# Patient Record
Sex: Male | Born: 1960 | Race: White | Hispanic: No | Marital: Single | Smoking: Former smoker
Health system: Southern US, Community
[De-identification: ages and names within clinical notes are randomized; demographics above are authoritative.]

## PROBLEM LIST (undated history)

## (undated) DIAGNOSIS — D649 Anemia, unspecified: Secondary | ICD-10-CM

## (undated) DIAGNOSIS — E119 Type 2 diabetes mellitus without complications: Secondary | ICD-10-CM

## (undated) DIAGNOSIS — M1990 Unspecified osteoarthritis, unspecified site: Secondary | ICD-10-CM

## (undated) DIAGNOSIS — L0390 Cellulitis, unspecified: Secondary | ICD-10-CM

## (undated) DIAGNOSIS — F32A Depression, unspecified: Secondary | ICD-10-CM

## (undated) DIAGNOSIS — I255 Ischemic cardiomyopathy: Secondary | ICD-10-CM

## (undated) DIAGNOSIS — E039 Hypothyroidism, unspecified: Secondary | ICD-10-CM

## (undated) DIAGNOSIS — I219 Acute myocardial infarction, unspecified: Secondary | ICD-10-CM

## (undated) DIAGNOSIS — K219 Gastro-esophageal reflux disease without esophagitis: Secondary | ICD-10-CM

## (undated) DIAGNOSIS — I2510 Atherosclerotic heart disease of native coronary artery without angina pectoris: Secondary | ICD-10-CM

## (undated) DIAGNOSIS — C801 Malignant (primary) neoplasm, unspecified: Secondary | ICD-10-CM

## (undated) DIAGNOSIS — J449 Chronic obstructive pulmonary disease, unspecified: Secondary | ICD-10-CM

## (undated) DIAGNOSIS — I10 Essential (primary) hypertension: Secondary | ICD-10-CM

## (undated) DIAGNOSIS — I739 Peripheral vascular disease, unspecified: Secondary | ICD-10-CM

## (undated) DIAGNOSIS — G4730 Sleep apnea, unspecified: Secondary | ICD-10-CM

## (undated) DIAGNOSIS — F329 Major depressive disorder, single episode, unspecified: Secondary | ICD-10-CM

## (undated) DIAGNOSIS — Z87442 Personal history of urinary calculi: Secondary | ICD-10-CM

## (undated) HISTORY — PX: DENTAL SURGERY: SHX609

## (undated) HISTORY — DX: Ischemic cardiomyopathy: I25.5

## (undated) HISTORY — DX: Atherosclerotic heart disease of native coronary artery without angina pectoris: I25.10

---

## 1998-12-07 ENCOUNTER — Emergency Department (HOSPITAL_COMMUNITY): Admission: EM | Admit: 1998-12-07 | Discharge: 1998-12-07 | Payer: Self-pay | Admitting: Emergency Medicine

## 1998-12-07 ENCOUNTER — Encounter: Payer: Self-pay | Admitting: Emergency Medicine

## 2001-07-13 ENCOUNTER — Ambulatory Visit (HOSPITAL_COMMUNITY): Admission: RE | Admit: 2001-07-13 | Discharge: 2001-07-13 | Payer: Self-pay | Admitting: Family Medicine

## 2001-07-13 ENCOUNTER — Encounter: Payer: Self-pay | Admitting: Family Medicine

## 2002-01-18 ENCOUNTER — Encounter: Payer: Self-pay | Admitting: Family Medicine

## 2002-01-18 ENCOUNTER — Ambulatory Visit (HOSPITAL_COMMUNITY): Admission: RE | Admit: 2002-01-18 | Discharge: 2002-01-18 | Payer: Self-pay | Admitting: Family Medicine

## 2008-03-19 ENCOUNTER — Emergency Department: Payer: Self-pay | Admitting: Emergency Medicine

## 2017-12-11 ENCOUNTER — Emergency Department (HOSPITAL_COMMUNITY): Payer: BLUE CROSS/BLUE SHIELD

## 2017-12-11 ENCOUNTER — Emergency Department (HOSPITAL_COMMUNITY)
Admission: EM | Admit: 2017-12-11 | Discharge: 2017-12-11 | Disposition: A | Discharge status: Home / Self Care | Payer: BLUE CROSS/BLUE SHIELD | Attending: Emergency Medicine | Admitting: Emergency Medicine

## 2017-12-11 ENCOUNTER — Encounter (HOSPITAL_COMMUNITY): Payer: Self-pay | Admitting: *Deleted

## 2017-12-11 ENCOUNTER — Other Ambulatory Visit: Payer: Self-pay

## 2017-12-11 DIAGNOSIS — W19XXXA Unspecified fall, initial encounter: Secondary | ICD-10-CM

## 2017-12-11 DIAGNOSIS — Z7982 Long term (current) use of aspirin: Secondary | ICD-10-CM | POA: Insufficient documentation

## 2017-12-11 DIAGNOSIS — R42 Dizziness and giddiness: Secondary | ICD-10-CM | POA: Diagnosis present

## 2017-12-11 DIAGNOSIS — Z79899 Other long term (current) drug therapy: Secondary | ICD-10-CM | POA: Insufficient documentation

## 2017-12-11 DIAGNOSIS — R531 Weakness: Secondary | ICD-10-CM

## 2017-12-11 DIAGNOSIS — E1165 Type 2 diabetes mellitus with hyperglycemia: Secondary | ICD-10-CM | POA: Insufficient documentation

## 2017-12-11 DIAGNOSIS — I10 Essential (primary) hypertension: Secondary | ICD-10-CM | POA: Insufficient documentation

## 2017-12-11 DIAGNOSIS — Z87891 Personal history of nicotine dependence: Secondary | ICD-10-CM | POA: Diagnosis not present

## 2017-12-11 DIAGNOSIS — Z9181 History of falling: Secondary | ICD-10-CM | POA: Insufficient documentation

## 2017-12-11 DIAGNOSIS — Z794 Long term (current) use of insulin: Secondary | ICD-10-CM | POA: Diagnosis not present

## 2017-12-11 DIAGNOSIS — R739 Hyperglycemia, unspecified: Secondary | ICD-10-CM

## 2017-12-11 HISTORY — DX: Type 2 diabetes mellitus without complications: E11.9

## 2017-12-11 HISTORY — DX: Depression, unspecified: F32.A

## 2017-12-11 HISTORY — DX: Major depressive disorder, single episode, unspecified: F32.9

## 2017-12-11 HISTORY — DX: Essential (primary) hypertension: I10

## 2017-12-11 LAB — CBC WITH DIFFERENTIAL/PLATELET
Basophils Absolute: 0 10*3/uL (ref 0.0–0.1)
Basophils Relative: 0 %
Eosinophils Absolute: 0.4 10*3/uL (ref 0.0–0.7)
Eosinophils Relative: 4 %
HCT: 39.7 % (ref 39.0–52.0)
Hemoglobin: 13.7 g/dL (ref 13.0–17.0)
Lymphocytes Relative: 21 %
Lymphs Abs: 2.1 10*3/uL (ref 0.7–4.0)
MCH: 29.8 pg (ref 26.0–34.0)
MCHC: 34.5 g/dL (ref 30.0–36.0)
MCV: 86.5 fL (ref 78.0–100.0)
Monocytes Absolute: 0.9 10*3/uL (ref 0.1–1.0)
Monocytes Relative: 9 %
Neutro Abs: 6.7 10*3/uL (ref 1.7–7.7)
Neutrophils Relative %: 66 %
Platelets: 314 10*3/uL (ref 150–400)
RBC: 4.59 MIL/uL (ref 4.22–5.81)
RDW: 12.2 % (ref 11.5–15.5)
WBC: 10.1 10*3/uL (ref 4.0–10.5)

## 2017-12-11 LAB — COMPREHENSIVE METABOLIC PANEL
ALT: 21 U/L (ref 17–63)
AST: 29 U/L (ref 15–41)
Albumin: 4 g/dL (ref 3.5–5.0)
Alkaline Phosphatase: 68 U/L (ref 38–126)
Anion gap: 16 — ABNORMAL HIGH (ref 5–15)
BUN: 21 mg/dL — ABNORMAL HIGH (ref 6–20)
CO2: 22 mmol/L (ref 22–32)
Calcium: 9.8 mg/dL (ref 8.9–10.3)
Chloride: 93 mmol/L — ABNORMAL LOW (ref 101–111)
Creatinine, Ser: 0.92 mg/dL (ref 0.61–1.24)
GFR calc Af Amer: >60 mL/min (ref >60)
GFR calc non Af Amer: >60 mL/min (ref >60)
Glucose, Bld: 393 mg/dL — ABNORMAL HIGH (ref 65–99)
Potassium: 3.6 mmol/L (ref 3.5–5.1)
Sodium: 131 mmol/L — ABNORMAL LOW (ref 135–145)
Total Bilirubin: 1.2 mg/dL (ref 0.3–1.2)
Total Protein: 7.8 g/dL (ref 6.5–8.1)

## 2017-12-11 LAB — CBG MONITORING, ED: Glucose-Capillary: 289 mg/dL — ABNORMAL HIGH (ref 65–99)

## 2017-12-11 IMAGING — DX DG CHEST 2V
2 series · 2 of 2 positions shown · non-contrast
Comparison: None.

CLINICAL DATA: Weakness, productive cough, dizziness, fell 1 week
ago after becoming dizzy. History of DM, HTN.

EXAM:
CHEST  2 VIEW

[chest ap]
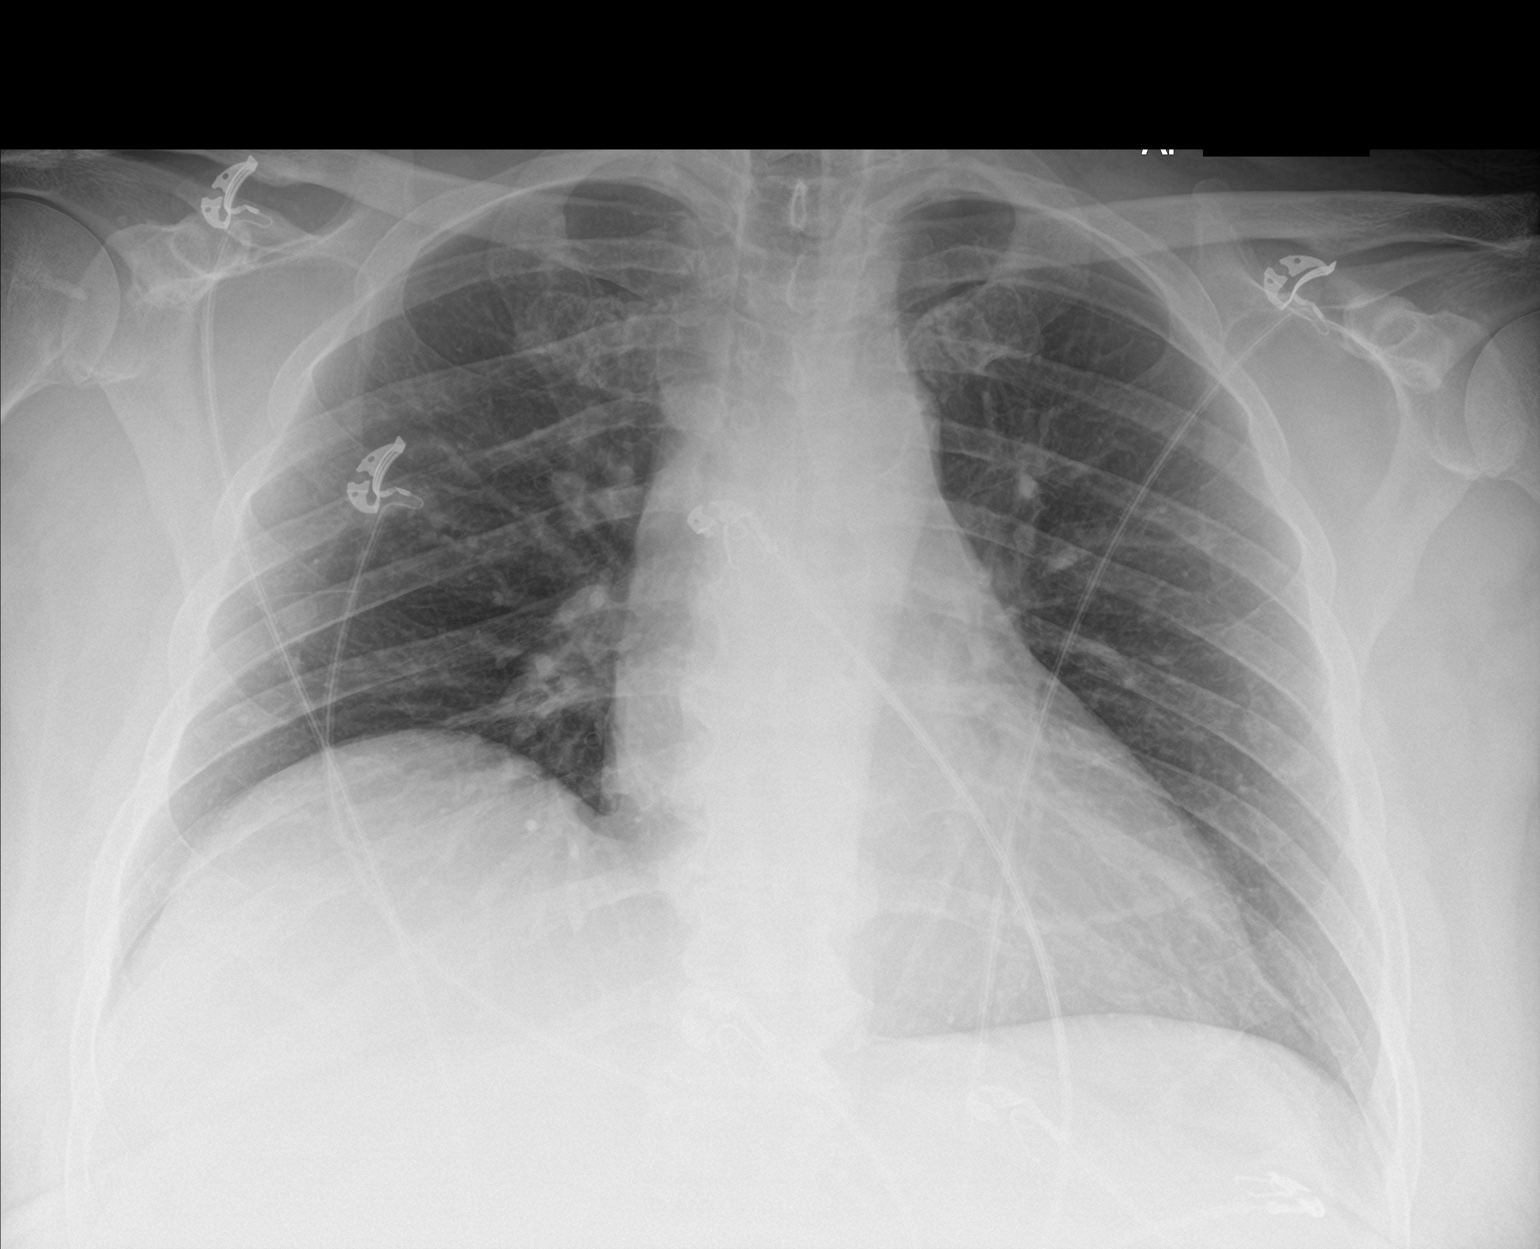

[chest lat]
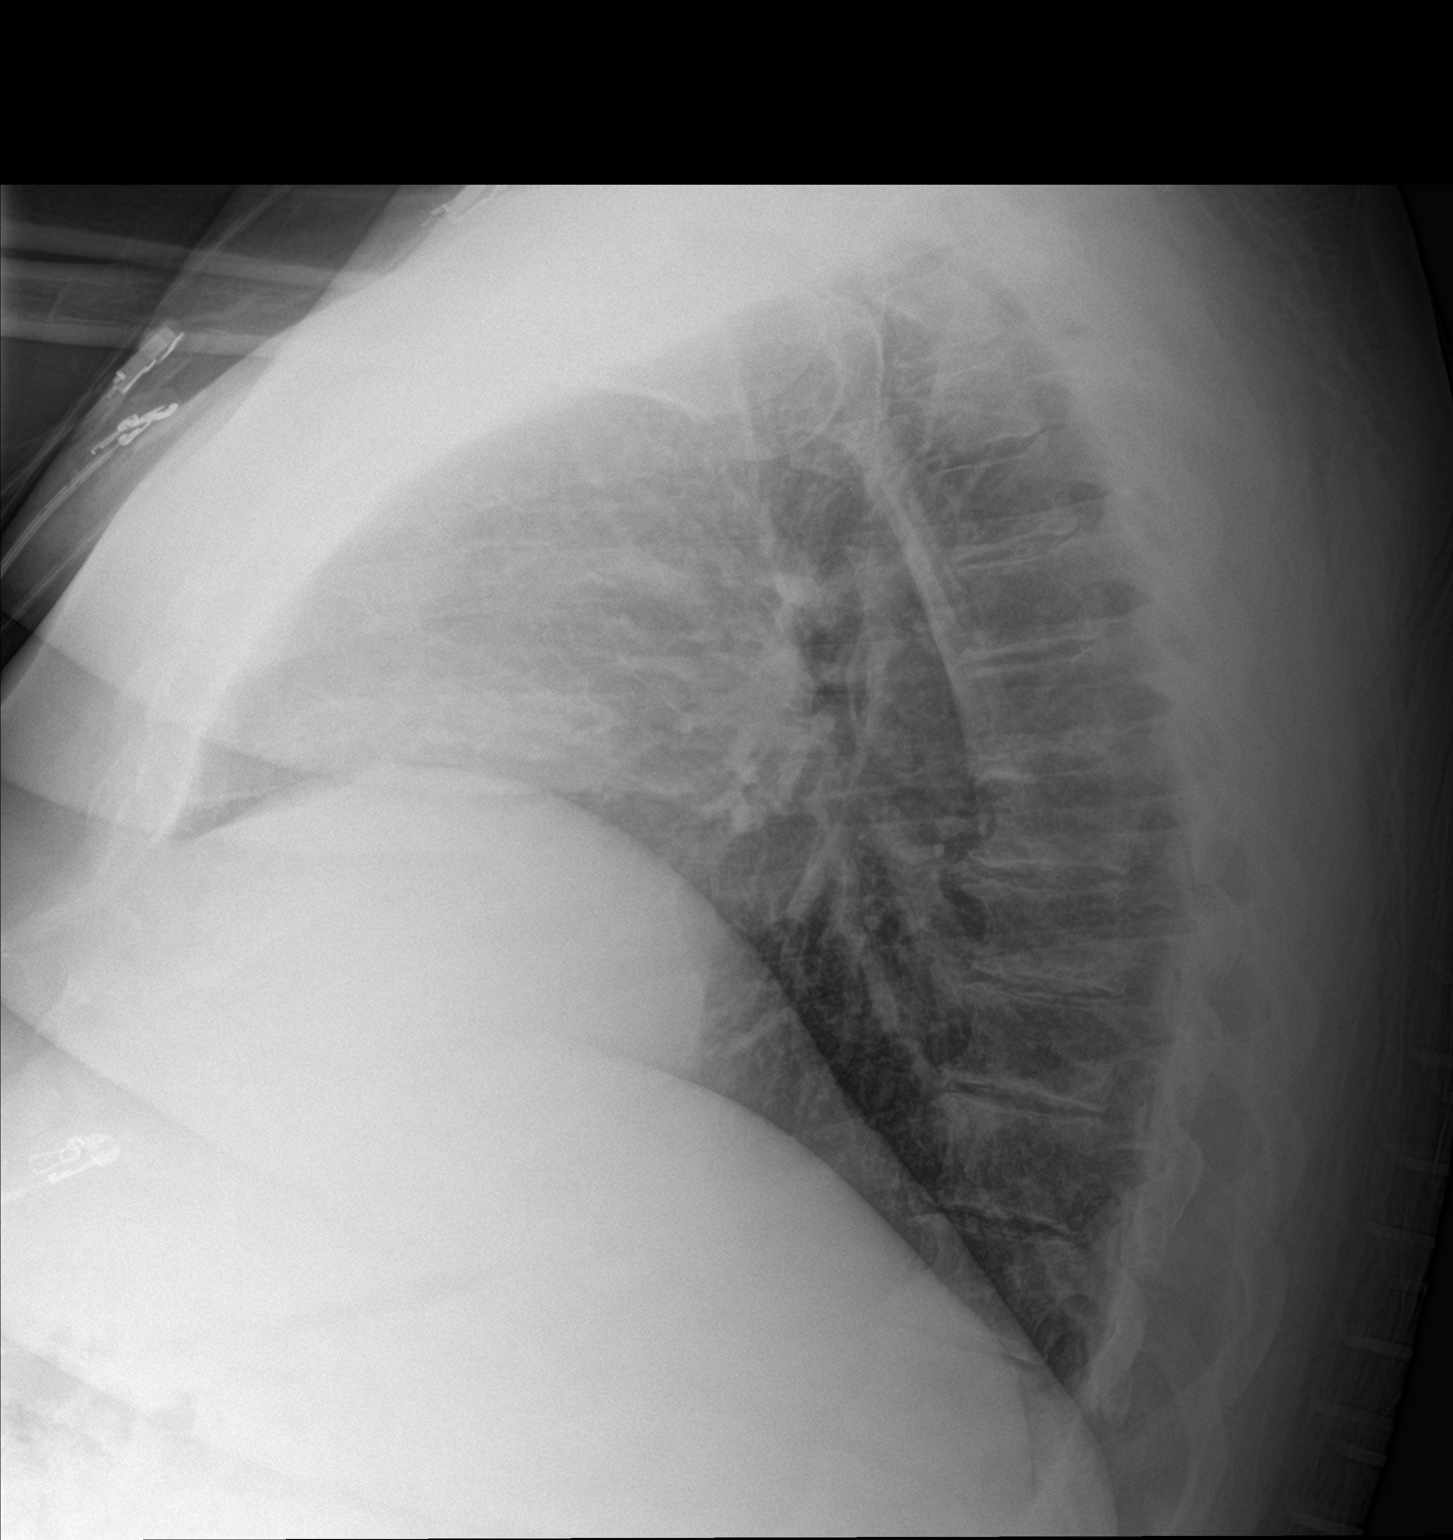

[2 of 2 positions shown; findings below may reference images not displayed]

FINDINGS: The heart size and mediastinal contours are within normal limits.
Both lungs are clear. The visualized skeletal structures are
unremarkable.
IMPRESSION: No active cardiopulmonary disease.

## 2017-12-11 MED ORDER — INSULIN ASPART 100 UNIT/ML ~~LOC~~ SOLN
5.0000 [IU] | Freq: Once | SUBCUTANEOUS | Status: AC
  Administered 2017-12-11: 5 [IU] via INTRAVENOUS
  Filled 2017-12-11: qty 1

## 2017-12-11 MED ORDER — SODIUM CHLORIDE 0.9 % IV BOLUS (SEPSIS)
1000.0000 mL | Freq: Once | INTRAVENOUS | Status: AC
  Administered 2017-12-11: 1000 mL via INTRAVENOUS

## 2017-12-11 MED ORDER — KETOROLAC TROMETHAMINE 30 MG/ML IJ SOLN
15.0000 mg | Freq: Once | INTRAMUSCULAR | Status: AC
  Administered 2017-12-11: 15 mg via INTRAVENOUS
  Filled 2017-12-11: qty 1

## 2017-12-11 NOTE — ED Notes (Signed)
Date and time results received: 12/11/17 2116  Test: CBG Critical Value: 289 mg/dL Name of Provider Notified: Vanita Panda, MD

## 2017-12-11 NOTE — Discharge Instructions (Signed)
As discussed, today's evaluation has been generally reassuring, but it is very important he follow-up with your physician on Monday to discuss her medications, specifically to ensure that you are taking appropriate regimen for your diabetes control. Please use Tylenol and ibuprofen for pain control, stay well-hydrated, get plenty of rest, and do not hesitate to return here for concerning changes in your condition.

## 2017-12-11 NOTE — ED Provider Notes (Signed)
[facility] EMERGENCY DEPARTMENT Provider Note   CSN: [id number] Arrival date & time: 12/11/17  1813     History   Chief Complaint Chief Complaint  Patient presents with  . Leg Pain  . Dizziness    HPI Shane Chambers is a 57 y.o. male.  HPI  Presents with multiple complaints, including generalized discomfort, and pain in his shoulder, hips, over the past week. He notes that he has diabetes, hypertension, takes all medication as directed. However, over at least the past week, possibly longer he is felt generally uncomfortable. Symptoms have progressed after he had a minor fall, 1 week ago, landing onto his backside. Since that time in addition to generalized discomfort he has had focal pain in the left shoulder, and lower back, with pain radiating down the left arm, and legs. Pain is sore, mildly improved with OTC medication. No inability to move any extremity, no new fever, vomiting.    Past Medical History:  Diagnosis Date  . Depression   . Diabetes mellitus without complication ([place])   . Hypertension     There are no active problems to display for this patient.   Past Surgical History:  Procedure Laterality Date  . DENTAL SURGERY         Home Medications    Prior to Admission medications   Medication Sig Start Date End Date Taking? Authorizing Provider  amLODipine (NORVASC) 10 MG tablet Take 10 mg by mouth daily.   Yes [provider]  aspirin 325 MG tablet Take 325 mg by mouth daily. 03/10/16  Yes [provider]  chlorthalidone (HYGROTON) 25 MG tablet Take 25 mg by mouth daily.   Yes [provider]  fluticasone (VERAMYST) 27.5 MCG/SPRAY nasal spray Place 2 sprays into the nose daily.   Yes [provider]  gabapentin (NEURONTIN) 300 MG capsule Take 300 mg by mouth at bedtime.   Yes [provider]  insulin glargine (LANTUS) 100 UNIT/ML injection Inject 50 Units into the skin daily.   Yes [provider]  insulin lispro (HUMALOG) 100 UNIT/ML injection Inject 10 Units into the skin once.   Yes [provider]  metFORMIN (GLUCOPHAGE) 1000 MG tablet Take 1,000 mg by mouth 2 (two) times daily with a meal.   Yes [provider]  metoprolol (TOPROL-XL) 200 MG 24 hr tablet Take 200 mg by mouth daily.   Yes [provider]  omeprazole (PRILOSEC) 40 MG capsule Take 40 mg by mouth daily.   Yes [provider]  traZODone (DESYREL) 100 MG tablet Take 100 mg by mouth at bedtime.   Yes [provider]    Family History No family history on file.  Social History Social History   Tobacco Use  . Smoking status: Former Research scientist (life sciences)  . Smokeless tobacco: Never Used  Substance Use Topics  . Alcohol use: Yes    Comment: rarely  . Drug use: No     Allergies   Ace inhibitors and Penicillins   Review of Systems Review of Systems  Constitutional:       Per HPI, otherwise negative  HENT:       Per HPI, otherwise negative  Respiratory:       Per HPI, otherwise negative  Cardiovascular:       Per HPI, otherwise negative  Gastrointestinal: Negative for vomiting.  Endocrine:       Negative aside from HPI  Genitourinary:       Neg aside from HPI  Musculoskeletal:       Per HPI, otherwise negative  Skin: Negative.   Neurological: Negative for syncope.     Physical Exam Updated Vital Signs BP (!) 144/87   Pulse 69   Temp 98 F (36.7 C)   Resp 14   Wt 117.9 kg (260 lb)   SpO2 100%   Physical Exam  Constitutional: He is oriented to person, place, and time. He appears well-developed. No distress.  Uncomfortable appearing male sitting upright speaking clearly.  HENT:  Head: Normocephalic and atraumatic.  Eyes: Conjunctivae and EOM are normal.  Cardiovascular: Normal rate and regular rhythm.  Pulmonary/Chest: Effort normal. No stridor. No respiratory distress.  Abdominal: He exhibits no distension.  Musculoskeletal: He exhibits no edema.    Patient has some tenderness to palpation about the left shoulder, and both hips, but no deformity, no hesitation to move any of his extremities spontaneously, no gross evidence for dislocation, nor fracture.   Neurological: He is alert and oriented to person, place, and time.  Skin: Skin is warm and dry.  Psychiatric: He has a normal mood and affect.  Nursing note and vitals reviewed.    ED Treatments / Results  Labs (all labs ordered are listed, but only abnormal results are displayed) Labs Reviewed  COMPREHENSIVE METABOLIC PANEL - Abnormal; Notable for the following components:      Result Value   Sodium 131 (*)    Chloride 93 (*)    Glucose, Bld 393 (*)    BUN 21 (*)    Anion gap 16 (*)    All other components within normal limits  CBC WITH DIFFERENTIAL/PLATELET    EKG  EKG Interpretation  Date/Time:  Friday December 11 2017 19:16:29 EST Ventricular Rate:  71 PR Interval:    QRS Duration: 110 QT Interval:  430 QTC Calculation: 468 R Axis:   -2 Text Interpretation:  Sinus rhythm Abnormal R-wave progression, late transition Abnormal inferior Q waves Baseline wander in lead(s) V2 Artifact Abnormal ekg Confirmed by Carmin Muskrat [phone]) on 12/11/2017 7:31:49 PM       Radiology Dg Chest 2 View  Result Date: 12/11/2017 CLINICAL DATA:  Weakness, productive cough, dizziness, fell 1 week ago after becoming dizzy. History of DM, HTN. EXAM: CHEST  2 VIEW COMPARISON:  None. FINDINGS: The heart size and mediastinal contours are within normal limits. Both lungs are clear. The visualized skeletal structures are unremarkable. IMPRESSION: No active cardiopulmonary disease. Electronically Signed   By: Nolon Nations M.D.   On: 12/11/2017 20:11    Procedures Procedures (including critical care time)  Medications Ordered in ED Medications  sodium chloride 0.9 % bolus 1,000 mL (0 mLs Intravenous Stopped 12/11/17 2018)  ketorolac (TORADOL) 30 MG/ML injection 15 mg (15 mg Intravenous  Given 12/11/17 1919)  sodium chloride 0.9 % bolus 1,000 mL (1,000 mLs Intravenous New Bag/Given 12/11/17 2052)  insulin aspart (novoLOG) injection 5 Units (5 Units Intravenous Given 12/11/17 2052)     Initial Impression / Assessment and Plan / ED Course  I have reviewed the triage vital signs and the nursing notes.  Pertinent labs & imaging results that were available during my care of the patient were reviewed by me and considered in my medical decision making (see chart for details).     9:22 PM Patient appears better, though he continues to complain of some generalized discomfort. Initial labs reviewed with the patient including hyperglycemia, minor anion gap. Patient has received 1 L of fluid, is receiving insulin, additional  fluids.  This 57 year old male with insulin dependent diabetes presents with ongoing generalized discomfort.  1 patient is found to have hyperglycemia, with a minor anion gap. Patient received 2 L fluid resuscitation, insulin, with decrease in his glucose level, and improvement in his condition here. Patient also is complaining of pain from a fall, but has been ambulatory, with no distress for 1 week, and has no deformities, no substantial pain suggesting fracture. With his improvement here, generally reassuring vital signs, improved labs, patient discharged with close outpatient follow-up, with specific instructions to discuss his insulin regimen, and other medications. Final Clinical Impressions(s) / ED Diagnoses  Weakness Hyperglycemia Fall, initial encounter   Carmin Muskrat, MD 12/11/17 2125

## 2017-12-11 NOTE — ED Triage Notes (Signed)
Multiple complaints, back pain, leg pain and dizziness for months, decided to come in today because he is experiencing cramps in legs while sleeping

## 2019-09-06 ENCOUNTER — Other Ambulatory Visit: Payer: Self-pay

## 2019-09-06 DIAGNOSIS — Z20822 Contact with and (suspected) exposure to covid-19: Secondary | ICD-10-CM

## 2019-09-07 LAB — NOVEL CORONAVIRUS, NAA: SARS-CoV-2, NAA: NOT DETECTED

## 2019-09-26 ENCOUNTER — Telehealth: Payer: Self-pay | Admitting: *Deleted

## 2019-09-26 NOTE — Telephone Encounter (Signed)
Patient called and was given negative COVID results ,and was also set up with my chart .

## 2019-11-15 ENCOUNTER — Other Ambulatory Visit: Payer: Self-pay

## 2019-11-15 ENCOUNTER — Ambulatory Visit: Payer: Medicaid Other

## 2019-11-15 ENCOUNTER — Emergency Department (HOSPITAL_COMMUNITY): Payer: Medicaid Other

## 2019-11-15 ENCOUNTER — Inpatient Hospital Stay (HOSPITAL_COMMUNITY)
Admission: EM | Admit: 2019-11-15 | Discharge: 2019-11-17 | DRG: 872 | Disposition: A | Discharge status: Home / Self Care | Payer: Medicaid Other | Attending: Family Medicine | Admitting: Family Medicine

## 2019-11-15 ENCOUNTER — Encounter (HOSPITAL_COMMUNITY): Payer: Self-pay | Admitting: *Deleted

## 2019-11-15 DIAGNOSIS — K219 Gastro-esophageal reflux disease without esophagitis: Secondary | ICD-10-CM | POA: Diagnosis present

## 2019-11-15 DIAGNOSIS — Z7982 Long term (current) use of aspirin: Secondary | ICD-10-CM

## 2019-11-15 DIAGNOSIS — Z87891 Personal history of nicotine dependence: Secondary | ICD-10-CM

## 2019-11-15 DIAGNOSIS — E1165 Type 2 diabetes mellitus with hyperglycemia: Secondary | ICD-10-CM | POA: Diagnosis present

## 2019-11-15 DIAGNOSIS — J209 Acute bronchitis, unspecified: Secondary | ICD-10-CM | POA: Diagnosis present

## 2019-11-15 DIAGNOSIS — N400 Enlarged prostate without lower urinary tract symptoms: Secondary | ICD-10-CM | POA: Diagnosis present

## 2019-11-15 DIAGNOSIS — F329 Major depressive disorder, single episode, unspecified: Secondary | ICD-10-CM | POA: Diagnosis present

## 2019-11-15 DIAGNOSIS — I10 Essential (primary) hypertension: Secondary | ICD-10-CM | POA: Diagnosis present

## 2019-11-15 DIAGNOSIS — Z794 Long term (current) use of insulin: Secondary | ICD-10-CM

## 2019-11-15 DIAGNOSIS — R6520 Severe sepsis without septic shock: Secondary | ICD-10-CM | POA: Diagnosis present

## 2019-11-15 DIAGNOSIS — Z79899 Other long term (current) drug therapy: Secondary | ICD-10-CM | POA: Diagnosis not present

## 2019-11-15 DIAGNOSIS — Z20822 Contact with and (suspected) exposure to covid-19: Secondary | ICD-10-CM | POA: Diagnosis present

## 2019-11-15 DIAGNOSIS — A419 Sepsis, unspecified organism: Secondary | ICD-10-CM | POA: Diagnosis present

## 2019-11-15 DIAGNOSIS — E119 Type 2 diabetes mellitus without complications: Secondary | ICD-10-CM

## 2019-11-15 DIAGNOSIS — R0602 Shortness of breath: Secondary | ICD-10-CM | POA: Diagnosis present

## 2019-11-15 DIAGNOSIS — F32A Depression, unspecified: Secondary | ICD-10-CM | POA: Diagnosis present

## 2019-11-15 DIAGNOSIS — E11649 Type 2 diabetes mellitus with hypoglycemia without coma: Secondary | ICD-10-CM

## 2019-11-15 LAB — RESPIRATORY PANEL BY RT PCR (FLU A&B, COVID)
Influenza A by PCR: NEGATIVE
Influenza B by PCR: NEGATIVE
SARS Coronavirus 2 by RT PCR: NEGATIVE

## 2019-11-15 LAB — COMPREHENSIVE METABOLIC PANEL
ALT: 20 U/L (ref 0–44)
AST: 21 U/L (ref 15–41)
Albumin: 3.5 g/dL (ref 3.5–5.0)
Alkaline Phosphatase: 98 U/L (ref 38–126)
Anion gap: 12 (ref 5–15)
BUN: 20 mg/dL (ref 6–20)
CO2: 28 mmol/L (ref 22–32)
Calcium: 9.1 mg/dL (ref 8.9–10.3)
Chloride: 94 mmol/L — ABNORMAL LOW (ref 98–111)
Creatinine, Ser: 0.85 mg/dL (ref 0.61–1.24)
GFR calc Af Amer: >60 mL/min (ref >60)
GFR calc non Af Amer: >60 mL/min (ref >60)
Glucose, Bld: 176 mg/dL — ABNORMAL HIGH (ref 70–99)
Potassium: 3.5 mmol/L (ref 3.5–5.1)
Sodium: 134 mmol/L — ABNORMAL LOW (ref 135–145)
Total Bilirubin: 0.6 mg/dL (ref 0.3–1.2)
Total Protein: 7.3 g/dL (ref 6.5–8.1)

## 2019-11-15 LAB — POC SARS CORONAVIRUS 2 AG -  ED: SARS Coronavirus 2 Ag: NEGATIVE

## 2019-11-15 LAB — CBC WITH DIFFERENTIAL/PLATELET
Abs Immature Granulocytes: 0.53 10*3/uL — ABNORMAL HIGH (ref 0.00–0.07)
Basophils Absolute: 0.1 10*3/uL (ref 0.0–0.1)
Basophils Relative: 1 %
Eosinophils Absolute: 0.6 10*3/uL — ABNORMAL HIGH (ref 0.0–0.5)
Eosinophils Relative: 3 %
HCT: 38.4 % — ABNORMAL LOW (ref 39.0–52.0)
Hemoglobin: 12.5 g/dL — ABNORMAL LOW (ref 13.0–17.0)
Immature Granulocytes: 3 %
Lymphocytes Relative: 22 %
Lymphs Abs: 3.8 10*3/uL (ref 0.7–4.0)
MCH: 28 pg (ref 26.0–34.0)
MCHC: 32.6 g/dL (ref 30.0–36.0)
MCV: 86.1 fL (ref 80.0–100.0)
Monocytes Absolute: 1.4 10*3/uL — ABNORMAL HIGH (ref 0.1–1.0)
Monocytes Relative: 8 %
Neutro Abs: 11.1 10*3/uL — ABNORMAL HIGH (ref 1.7–7.7)
Neutrophils Relative %: 63 %
Platelets: 354 10*3/uL (ref 150–400)
RBC: 4.46 MIL/uL (ref 4.22–5.81)
RDW: 12.7 % (ref 11.5–15.5)
WBC: 17.6 10*3/uL — ABNORMAL HIGH (ref 4.0–10.5)
nRBC: 0 % (ref 0.0–0.2)

## 2019-11-15 LAB — BRAIN NATRIURETIC PEPTIDE: B Natriuretic Peptide: 134 pg/mL — ABNORMAL HIGH (ref 0.0–100.0)

## 2019-11-15 LAB — LACTIC ACID, PLASMA: Lactic Acid, Venous: 2.3 mmol/L (ref 0.5–1.9)

## 2019-11-15 LAB — GLUCOSE, CAPILLARY: Glucose-Capillary: 120 mg/dL — ABNORMAL HIGH (ref 70–99)

## 2019-11-15 LAB — MAGNESIUM: Magnesium: 1.5 mg/dL — ABNORMAL LOW (ref 1.7–2.4)

## 2019-11-15 LAB — D-DIMER, QUANTITATIVE: D-Dimer, Quant: 0.41 ug/mL-FEU (ref 0.00–0.50)

## 2019-11-15 LAB — TROPONIN I (HIGH SENSITIVITY): Troponin I (High Sensitivity): 74 ng/L — ABNORMAL HIGH (ref <18)

## 2019-11-15 LAB — PROCALCITONIN: Procalcitonin: <0.1 ng/mL

## 2019-11-15 IMAGING — DX DG CHEST 1V PORT
1 series · 1 of 1 positions shown · non-contrast
Comparison: PA and lateral chest [DATE].

CLINICAL DATA: Chest pain, weakness and shortness of breath.

EXAM:
PORTABLE CHEST 1 VIEW

[chest ap]
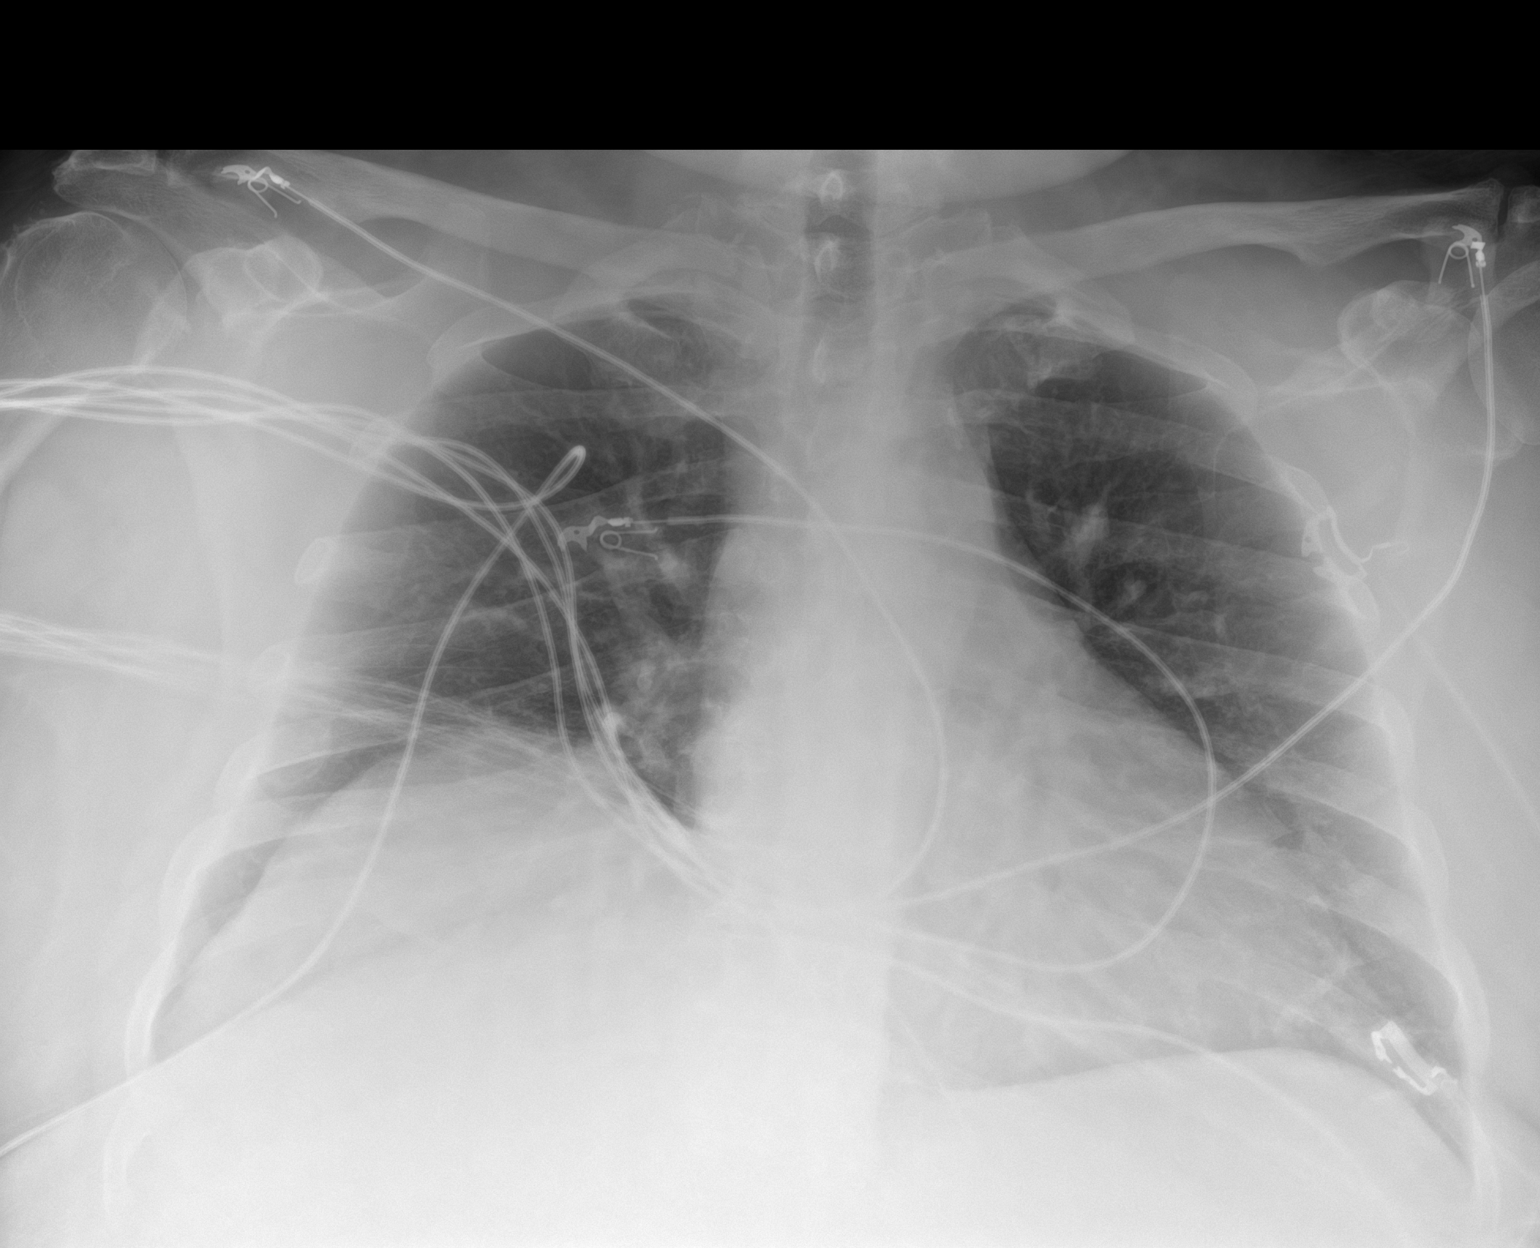

[1 of 1 positions shown; findings below may reference images not displayed]

FINDINGS: Eventration of the right hemidiaphragm is unchanged. Lungs clear.
Heart size normal. No pneumothorax or pleural effusion. No acute or
focal bony abnormality.
IMPRESSION: Negative chest.

## 2019-11-15 MED ORDER — IPRATROPIUM-ALBUTEROL 20-100 MCG/ACT IN AERS
1.0000 | INHALATION_SPRAY | Freq: Four times a day (QID) | RESPIRATORY_TRACT | Status: DC
  Administered 2019-11-15 – 2019-11-16 (×4): 1 via RESPIRATORY_TRACT
  Filled 2019-11-15 (×2): qty 4

## 2019-11-15 MED ORDER — ENOXAPARIN SODIUM 40 MG/0.4ML ~~LOC~~ SOLN
40.0000 mg | SUBCUTANEOUS | Status: DC
  Administered 2019-11-16 – 2019-11-17 (×2): 40 mg via SUBCUTANEOUS
  Filled 2019-11-15 (×2): qty 0.4

## 2019-11-15 MED ORDER — METHYLPREDNISOLONE SODIUM SUCC 40 MG IJ SOLR
40.0000 mg | Freq: Four times a day (QID) | INTRAMUSCULAR | Status: DC
  Administered 2019-11-15 – 2019-11-16 (×3): 40 mg via INTRAVENOUS
  Filled 2019-11-15 (×3): qty 1

## 2019-11-15 MED ORDER — SODIUM CHLORIDE 0.9% FLUSH
3.0000 mL | Freq: Two times a day (BID) | INTRAVENOUS | Status: DC
  Administered 2019-11-15 – 2019-11-16 (×2): 3 mL via INTRAVENOUS

## 2019-11-15 MED ORDER — INSULIN GLARGINE 100 UNIT/ML ~~LOC~~ SOLN
20.0000 [IU] | Freq: Two times a day (BID) | SUBCUTANEOUS | Status: DC
  Administered 2019-11-15 – 2019-11-16 (×2): 20 [IU] via SUBCUTANEOUS
  Filled 2019-11-15 (×5): qty 0.2

## 2019-11-15 MED ORDER — INSULIN ASPART 100 UNIT/ML ~~LOC~~ SOLN
0.0000 [IU] | Freq: Three times a day (TID) | SUBCUTANEOUS | Status: DC
  Administered 2019-11-16 (×2): 11 [IU] via SUBCUTANEOUS
  Administered 2019-11-16: 8 [IU] via SUBCUTANEOUS
  Administered 2019-11-17: 11 [IU] via SUBCUTANEOUS

## 2019-11-15 MED ORDER — FUROSEMIDE 10 MG/ML IJ SOLN
40.0000 mg | Freq: Once | INTRAMUSCULAR | Status: AC
  Administered 2019-11-15: 40 mg via INTRAVENOUS
  Filled 2019-11-15: qty 4

## 2019-11-15 MED ORDER — AMLODIPINE BESYLATE 5 MG PO TABS
10.0000 mg | ORAL_TABLET | Freq: Every day | ORAL | Status: DC
  Administered 2019-11-16 – 2019-11-17 (×2): 10 mg via ORAL
  Filled 2019-11-15 (×2): qty 2

## 2019-11-15 MED ORDER — PANTOPRAZOLE SODIUM 40 MG PO TBEC
40.0000 mg | DELAYED_RELEASE_TABLET | Freq: Every day | ORAL | Status: DC
  Administered 2019-11-16 – 2019-11-17 (×2): 40 mg via ORAL
  Filled 2019-11-15 (×2): qty 1

## 2019-11-15 MED ORDER — GABAPENTIN 300 MG PO CAPS
300.0000 mg | ORAL_CAPSULE | Freq: Two times a day (BID) | ORAL | Status: DC
  Administered 2019-11-15 – 2019-11-17 (×4): 300 mg via ORAL
  Filled 2019-11-15 (×4): qty 1

## 2019-11-15 MED ORDER — ACETAMINOPHEN 325 MG PO TABS: 650.0000 mg | ORAL_TABLET | Freq: Four times a day (QID) | ORAL | Status: DC | PRN

## 2019-11-15 MED ORDER — TRAZODONE HCL 50 MG PO TABS
100.0000 mg | ORAL_TABLET | Freq: Every day | ORAL | Status: DC
  Filled 2019-11-15: qty 2

## 2019-11-15 MED ORDER — VENLAFAXINE HCL ER 75 MG PO CP24
150.0000 mg | ORAL_CAPSULE | Freq: Every day | ORAL | Status: DC
  Administered 2019-11-16 – 2019-11-17 (×2): 150 mg via ORAL
  Filled 2019-11-15 (×2): qty 2

## 2019-11-15 MED ORDER — DOXYCYCLINE HYCLATE 100 MG PO TABS
100.0000 mg | ORAL_TABLET | Freq: Two times a day (BID) | ORAL | Status: DC
  Administered 2019-11-15 – 2019-11-17 (×4): 100 mg via ORAL
  Filled 2019-11-15 (×4): qty 1

## 2019-11-15 MED ORDER — ALBUTEROL SULFATE HFA 108 (90 BASE) MCG/ACT IN AERS
4.0000 | INHALATION_SPRAY | Freq: Once | RESPIRATORY_TRACT | Status: AC
  Administered 2019-11-15: 4 via RESPIRATORY_TRACT
  Filled 2019-11-15: qty 6.7

## 2019-11-15 MED ORDER — ASPIRIN 325 MG PO TABS
325.0000 mg | ORAL_TABLET | Freq: Once | ORAL | Status: AC
  Administered 2019-11-15: 325 mg via ORAL
  Filled 2019-11-15: qty 1

## 2019-11-15 MED ORDER — ASPIRIN EC 81 MG PO TBEC
81.0000 mg | DELAYED_RELEASE_TABLET | Freq: Every day | ORAL | Status: DC
  Administered 2019-11-16 – 2019-11-17 (×2): 81 mg via ORAL
  Filled 2019-11-15 (×2): qty 1

## 2019-11-15 MED ORDER — ONDANSETRON HCL 4 MG/2ML IJ SOLN
4.0000 mg | Freq: Once | INTRAMUSCULAR | Status: AC
  Administered 2019-11-15: 4 mg via INTRAVENOUS
  Filled 2019-11-15: qty 2

## 2019-11-15 MED ORDER — METOPROLOL SUCCINATE ER 50 MG PO TB24
200.0000 mg | ORAL_TABLET | Freq: Every day | ORAL | Status: DC
  Administered 2019-11-16 – 2019-11-17 (×2): 200 mg via ORAL
  Filled 2019-11-15 (×2): qty 4

## 2019-11-15 MED ORDER — SODIUM CHLORIDE 0.9 % IV BOLUS
500.0000 mL | Freq: Once | INTRAVENOUS | Status: AC
  Administered 2019-11-15: 500 mL via INTRAVENOUS

## 2019-11-15 MED ORDER — ALBUTEROL SULFATE (2.5 MG/3ML) 0.083% IN NEBU: 2.5000 mg | INHALATION_SOLUTION | RESPIRATORY_TRACT | Status: DC | PRN

## 2019-11-15 MED ORDER — ACETAMINOPHEN 650 MG RE SUPP: 650.0000 mg | Freq: Four times a day (QID) | RECTAL | Status: DC | PRN

## 2019-11-15 MED ORDER — AEROCHAMBER PLUS FLO-VU MEDIUM MISC
1.0000 | Freq: Once | Status: DC
  Filled 2019-11-15: qty 1

## 2019-11-15 MED ORDER — INSULIN ASPART 100 UNIT/ML ~~LOC~~ SOLN
0.0000 [IU] | Freq: Every day | SUBCUTANEOUS | Status: DC
  Administered 2019-11-16: 3 [IU] via SUBCUTANEOUS

## 2019-11-15 MED ORDER — ATORVASTATIN CALCIUM 40 MG PO TABS
40.0000 mg | ORAL_TABLET | Freq: Every day | ORAL | Status: DC
  Administered 2019-11-15 – 2019-11-17 (×3): 40 mg via ORAL
  Filled 2019-11-15: qty 1
  Filled 2019-11-15: qty 2
  Filled 2019-11-15: qty 1

## 2019-11-15 NOTE — ED Triage Notes (Signed)
Patient with shortness of breath for one week.  Patient was at covid testing site, but had been asked to come the the ER for evaluation.  Patient denies exposure, does not report any fever.

## 2019-11-15 NOTE — H&P (Signed)
History and Physical    PLEASE NOTE THAT DRAGON DICTATION SOFTWARE WAS USED IN THE CONSTRUCTION OF THIS NOTE.   Shane Chambers OVF:[id number] DOB: 1961/06/14 DOA: 11/15/2019  PCP: Patient, No Pcp Per Patient coming from: home   I have personally briefly reviewed patient's old medical records in [place]  Chief Complaint: Shortness of breath  HPI: Shane Chambers is a 59 y.o. male with medical history significant for chronic right hemidiaphragm, hypertension, type 2 diabetes mellitus, who is admitted to [facility] on 11/15/2019 with suspected acute bronchitis with bronchospasm after presenting from home to [facility] Emergency Department complaining of shortness of breath.  The patient reports 4 to 5 days of progressive shortness of breath associated with nonproductive cough, generalized myalgias, and wheezing.  Denies any associated subjective fever, chills, or rigors.  Denies any associated neck stiffness, rhinitis, rhinorrhea, sore throat.  Reports associated nausea in the absence of any vomiting.  Denies any recent abdominal discomfort, diarrhea, melena, hematochezia, or rash.  Denies any recent traveling or known COVID-19 exposures.  Presentation not associate with any hemoptysis, calf tenderness, or erythema in the lower extremities.  Denies any associated chest pain, palpitations, diaphoresis.  Also denies any associated orthopnea, PND, or worsening of peripheral edema.  Denies any recent trauma.   The patient had presented to his PCP late last week for evaluation of the above.  At that time he was started on a Z-Pak as well as a short course of prednisone.  He reports good compliance in taking both of these medications, and notes that he has completed his Z-Pak, while he has 1 more dose of prednisone remaining.  Patient acknowledges that he is a former smoker, but denies any known underlying COPD.     ED Course:  Vital signs in the ED were notable for the following:  Temperature max 97.3; heart rate 74-78; blood pressure ranged from 152/73-160 2/77; respiratory rate 18-24, and oxygen saturation 95 to 98% on room air.  Labs were notable for the following: CMP notable for sodium 134, bicarbonate 28, creatinine 0.85, and liver enzymes found to be within normal limits.  BNP 134, with no prior value available for point comparison.  High-sensitivity troponin I x1 found to be 74, with no prior data point available for comparison.  CBC notable for white blood cell count of 17,600 with 63% neutrophils; D-dimer 0.41.  COVID-19 PCR found to be negative.  Influenza AMB 1 view chest x-ray, per final radiology report, showed unchanged right hemidiaphragm and no evidence of acute cardiopulmonary process, including no evidence of infiltrate, edema, effusion, or pneumothorax.  EKG showed sinus rhythm with heart rate 77, mildly prolonged QTC of 488 ms, and no evidence of T wave or ST changes.  While in the ED, the following were administered: Albuterol inhaler x1; Lasix 40 mg IV x1, and a 500 cc IV normal saline bolus.    Review of Systems: As per HPI otherwise 10 point review of systems negative.   Past Medical History:  Diagnosis Date  . Depression   . Diabetes mellitus without complication ([place])   . Hypertension     Past Surgical History:  Procedure Laterality Date  . DENTAL SURGERY      Social History:  reports that he has quit smoking. He has never used smokeless tobacco. He reports current alcohol use. He reports that he does not use drugs.   Allergies  Allergen Reactions  . Ace Inhibitors   . Penicillins  Has patient had a PCN reaction causing immediate rash, facial/tongue/throat swelling, SOB or lightheadedness with hypotension: Unknown Has patient had a PCN reaction causing severe rash involving mucus membranes or skin necrosis: Unknown Has patient had a PCN reaction that required hospitalization: Unknown Has patient had a PCN reaction occurring  within the last 10 years: Unknown If all of the above answers are "NO", then may proceed with Cephalosporin use.     History reviewed. No pertinent family history.   Prior to Admission medications   Medication Sig Start Date End Date Taking? Authorizing Provider  amLODipine (NORVASC) 10 MG tablet Take 10 mg by mouth daily.   Yes [provider]  atorvastatin (LIPITOR) 40 MG tablet Take 40 mg by mouth daily. 10/20/19  Yes [provider]  chlorthalidone (HYGROTON) 25 MG tablet Take 25 mg by mouth daily.   Yes [provider]  diclofenac Sodium (VOLTAREN) 1 % GEL Apply 2 g topically 4 (four) times daily.   Yes [provider]  fluticasone (VERAMYST) 27.5 MCG/SPRAY nasal spray Place 2 sprays into the nose daily.   Yes [provider]  gabapentin (NEURONTIN) 300 MG capsule Take 300 mg by mouth 2 (two) times daily.    Yes [provider]  Insulin Glargine (LANTUS) 100 UNIT/ML Solostar Pen Inject 50 Units into the skin daily.    Yes [provider]  metoprolol (TOPROL-XL) 200 MG 24 hr tablet Take 200 mg by mouth daily.   Yes [provider]  Multiple Vitamins-Minerals (TAB-A-VITE) TABS Take 1 tablet by mouth daily. 07/27/19  Yes [provider]  NOVOLOG MIX 70/30 (70-30) 100 UNIT/ML injection Inject into the skin 2 (two) times daily with a meal.  10/28/19  Yes [provider]  omeprazole (PRILOSEC) 40 MG capsule Take 40 mg by mouth daily.   Yes [provider]  tamsulosin (FLOMAX) 0.4 MG CAPS capsule Take 0.4 mg by mouth daily.   Yes [provider]  venlafaxine XR (EFFEXOR-XR) 150 MG 24 hr capsule Take 150 mg by mouth daily with breakfast.   Yes [provider]  albuterol (VENTOLIN HFA) 108 (90 Base) MCG/ACT inhaler  07/27/19   [provider]  aspirin 81 MG EC tablet Take 81 mg by mouth daily.  03/10/16   [provider]  traZODone (DESYREL) 100 MG tablet Take 100 mg  by mouth at bedtime.    [provider]     Objective    Physical Exam: Vitals:   11/15/19 1400 11/15/19 1430 11/15/19 1445 11/15/19 1500  BP: (!) 157/83 (!) 159/79  (!) 162/77  Pulse: 78     Resp: (!) 26 (!) 21 16 (!) 22  Temp:      TempSrc:      SpO2: 95%     Weight:      Height:        General: appears to be stated age; alert, oriented; increased wob. Skin: warm, dry, no rash Head:  AT/[place] Eyes:  PEARL b/l, EOMI Mouth:  Oral mucosa membranes appear dry, normal dentition Neck: supple; trachea midline Heart:  RRR; did not appreciate any M/R/G Lungs: CTAB, did not appreciate any wheezes, rales, or rhonchi Abdomen: + BS; soft, ND, NT Vascular: 2+ pedal pulses b/l; 2+ radial pulses b/l Extremities: no peripheral edema, no muscle wasting   Labs on Admission: I have personally reviewed following labs and imaging studies  CBC: Recent Labs  Lab 11/15/19 1136  WBC 17.6*  NEUTROABS 11.1*  HGB 12.5*  HCT 38.4*  MCV 86.1  PLT 269   Basic Metabolic Panel: Recent Labs  Lab 11/15/19 1136  NA 134*  K 3.5  CL 94*  CO2 28  GLUCOSE 176*  BUN 20  CREATININE 0.85  CALCIUM 9.1   GFR: Estimated Creatinine Clearance: 127.9 mL/min (by C-G formula based on SCr of 0.85 mg/dL). Liver Function Tests: Recent Labs  Lab 11/15/19 1136  AST 21  ALT 20  ALKPHOS 98  BILITOT 0.6  PROT 7.3  ALBUMIN 3.5   No results for input(s): LIPASE, AMYLASE in the last 168 hours. No results for input(s): AMMONIA in the last 168 hours. Coagulation Profile: No results for input(s): INR, PROTIME in the last 168 hours. Cardiac Enzymes: No results for input(s): CKTOTAL, CKMB, CKMBINDEX, TROPONINI in the last 168 hours. BNP (last 3 results) No results for input(s): PROBNP in the last 8760 hours. HbA1C: No results for input(s): HGBA1C in the last 72 hours. CBG: No results for input(s): GLUCAP in the last 168 hours. Lipid Profile: No results for input(s): CHOL, HDL, LDLCALC, TRIG,  CHOLHDL, LDLDIRECT in the last 72 hours. Thyroid Function Tests: No results for input(s): TSH, T4TOTAL, FREET4, T3FREE, THYROIDAB in the last 72 hours. Anemia Panel: No results for input(s): VITAMINB12, FOLATE, FERRITIN, TIBC, IRON, RETICCTPCT in the last 72 hours. Urine analysis: No results found for: COLORURINE, APPEARANCEUR, LABSPEC, [place], GLUCOSEU, HGBUR, BILIRUBINUR, KETONESUR, PROTEINUR, UROBILINOGEN, NITRITE, LEUKOCYTESUR  Radiological Exams on Admission: DG Chest Portable 1 View  Result Date: 11/15/2019 CLINICAL DATA:  Chest pain, weakness and shortness of breath. EXAM: PORTABLE CHEST 1 VIEW COMPARISON:  PA and lateral chest 12/11/2017. FINDINGS: Eventration of the right hemidiaphragm is unchanged. Lungs clear. Heart size normal. No pneumothorax or pleural effusion. No acute or focal bony abnormality. IMPRESSION: Negative chest. Electronically Signed   By: Inge Rise M.D.   On: 11/15/2019 12:01     EKG: Independently reviewed, with result as described above.    Assessment/Plan   Shane Chambers is a 59 y.o. male with medical history significant for chronic right hemidiaphragm, hypertension, type 2 diabetes mellitus, who is admitted to [facility] on 11/15/2019 with suspected acute bronchitis with bronchospasm after presenting from home to [facility] Emergency Department complaining of shortness of breath.   Principal Problem:   Acute bronchitis with bronchospasm Active Problems:   SOB (shortness of breath)   Sepsis ([place])   Hypertension   Diabetes mellitus without complication (HCC)   Depression   #) Acute bronchitis with bronchospasm: In the setting of no known underlying COPD, the patient presents with 5 days of shortness of breath associated with generalized myalgias and wheezing.  No evidence of associated acute hypoxia, and presenting chest x-ray shows no evidence of acute cardiopulmonary process.  Additionally, COVID-19 PCR as well as rapid influenza  performed in the ED this evening were both found to be negative.  Acute pulmonary embolism very unlikely given the high negative predictive value associated with presenting nonelevated D-dimer.  Additionally, presenting EKG shows sinus rhythm with no evidence of acute ischemic changes.  Overall, suspect acute bronchitis on the basis of underlying viral etiology.  Given evidence of bronchospasm on the basis of concomitant wheezing, will initiate Solu-Medrol.  As the patient has already completed a Z-Pak over the course of the last few days, will transition to doxycycline, particular in the setting of mildly prolonged QTC identified on presenting EKG.  Plan: Combivent every 6 hours while awake.  As needed albuterol.  Solu-Medrol 40  mg IV every 6 hours.  Doxycycline, as above.  Monitor on continuous pulse oximetry.  Check procalcitonin.  Add on serum magnesium level.  Check serum phosphorus level.     #) Sepsis: SIRS criteria met via presenting tachypnea as well as leukocytosis.  Suspected underlying infection felt to be viral in nature and associated with presenting acute bronchitis with bronchospasm, as above.  No evidence of underlying bacterial infection at this time, including presenting chest x-ray showing no evidence of infiltrate.  Consequently, will not initiate antibiotics beyond aforementioned doxycycline for acute bronchitis, as above. will also check urinalysis.  Criteria are not met for patient sepsis to be considered severe in nature in the absence of any evidence of concomitant endorgan damage.   Plan: Check urinalysis.  Check procalcitonin.  Check lactic acid.  Work-up and management of presenting acute bronchitis with bronchospasm, as above.  Check blood cultures x2.  Repeat CBC with differential in the morning.     #) Mildly elevated troponin: Presenting high-sensitivity troponin I found to be very mildly elevated at 74 and felt to represent mild supply demand mismatch in the setting of  presenting acute bronchitis with bronchospasm.  Presentation is not associate with any chest pain, and EKG shows no evidence of acute ischemic changes.  Overall, ACS felt unlikely at this time. Will monitor for development of chest discomfort, and repeat troponin value.  Plan: Trend subsequent troponin, with repeat value ordered for the morning.  Monitor on telemetry.  Full dose aspirin x1 now, followed by resumption of home daily baby aspirin.  Continue home high intensity atorvastatin, with next dose to occur now.  Continue home beta-blocker.     #) History of essential hypertension: Outpatient antihypertensive regimen includes the following: Norvasc, chlorthalidone, Toprol-XL.  Additionally, the patient is on Flomax in the setting of a history of BPH.  Presenting systolic blood pressures noted to be in the 150s mmHg.   Plan: In the setting of septic presentation, will hold home Norvasc, chlorthalidone, and Flomax for now, will continuing home beta-blocker.  Close monitoring of ensuing blood pressures via routine vital signs.     #) Type 2 diabetes mellitus: Complicated by peripheral polyneuropathy on gabapentin.  Outpatient insulin regimen, per patient, consists of Lantus 65 units subcu twice daily as well as sliding scale NovoLog with meals.  The patient acknowledges suboptimal compliance with his basal insulin, reporting that he typically misses multiple doses of such each week.  Presenting blood sugar per presenting CMP noted to be 176.  We will closely monitor ins particularly given plan to initiate Solu-Medrol for presenting acute bronchitis with bronchospasm, as above.  Plan: In terms of basal insulin during this hospitalization, will initially proceed conservatively with Lantus 20 units subcu twice daily given the patient's report of suboptimal compliance with outpatient insulin.  Accu-Cheks before every meal and at bedtime with sliding scale insulin.  Counseled the patient on the  importance of improved outpatient compliance with home insulin regimen.  Check hemoglobin A1c in the morning.  Continue home gabapentin.      #) Depression: On Effexor as an outpatient.  Plan: Continue home Effexor.       #) GERD: On omeprazole at home.  Plan: Continue home PPI.     #) Benign prostatic hyperplasia: On Flomax at home.  Plan: We will hold home Flomax in the setting of septic presentation, as above.  Monitor strict I's and O's and daily weights.  Repeat BMP in the morning.  DVT prophylaxis: Lovenox 40 mg subcu daily Code Status: Full code Family Communication: None Disposition Plan:  Per Rounding Team Consults called: None Admission status: Inpatient; med telemetry    PLEASE NOTE THAT DRAGON DICTATION SOFTWARE WAS USED IN THE CONSTRUCTION OF THIS NOTE.   [place] [facility] Pager [phone] From 3PM- 11PM.   Otherwise, please contact night-coverage  [email or web address] Password [facility]  11/15/2019, 5:41 PM

## 2019-11-15 NOTE — Progress Notes (Signed)
Last Lactic Acid resulted was 2.3, Lab stated no orders to redraw. MD notified. No new orders. Patient new admit to unit 300.

## 2019-11-15 NOTE — ED Provider Notes (Signed)
[facility] Provider Note   CSN: [id number] Arrival date & time: 11/15/19  1029     History Chief Complaint  Patient presents with  . Shortness of Breath    Shane Chambers is a 59 y.o. male with a past medical history of diabetes and hypertension, presenting with a 1 week history of increasing shortness of breath, generalized fatigue and body aches.  Additionally, reports nausea without emesis, no abdominal pain, no diarrhea.  He has had reduced oral intake secondary to nausea.  He also initially had a sore throat which is improved since being treated with a Z-Pak and a course of prednisone which was given by his PCP when seen last week.  He reports intermittent wheezing along with chest tightness and increasing shortness of breath.  He was seen at a local drive-through Covid testing site this morning and was sent here secondary to dyspnea.  He denies fevers or chills.  He is a former smoker quit over 10 years ago.  He denies diagnosis of asthma or COPD, but has had reactive airway disease in the past with bronchitis.  No known exposures to Covid.  He is currently on a prednisone taper, had a 6-day 62 5 mg taper and will take his last dose tomorrow.  Symptoms are worsened with exertion.  He denies chest pain.  He does have intermittent ankle edema which is not worsened today.   The history is provided by the patient.  Shortness of Breath Associated symptoms: sore throat and wheezing   Associated symptoms: no abdominal pain, no chest pain, no cough, no fever, no headaches, no neck pain, no rash and no vomiting        Past Medical History:  Diagnosis Date  . Depression   . Diabetes mellitus without complication ([place])   . Hypertension     There are no problems to display for this patient.   Past Surgical History:  Procedure Laterality Date  . DENTAL SURGERY         History reviewed. No pertinent family history.  Social History   Tobacco Use  . Smoking  status: Former Research scientist (life sciences)  . Smokeless tobacco: Never Used  Substance Use Topics  . Alcohol use: Yes    Comment: rarely  . Drug use: No    Home Medications Prior to Admission medications   Medication Sig Start Date End Date Taking? Authorizing Provider  amLODipine (NORVASC) 10 MG tablet Take 10 mg by mouth daily.   Yes [provider]  atorvastatin (LIPITOR) 40 MG tablet Take 40 mg by mouth daily. 10/20/19  Yes [provider]  chlorthalidone (HYGROTON) 25 MG tablet Take 25 mg by mouth daily.   Yes [provider]  diclofenac Sodium (VOLTAREN) 1 % GEL Apply 2 g topically 4 (four) times daily.   Yes [provider]  fluticasone (VERAMYST) 27.5 MCG/SPRAY nasal spray Place 2 sprays into the nose daily.   Yes [provider]  gabapentin (NEURONTIN) 300 MG capsule Take 300 mg by mouth 2 (two) times daily.    Yes [provider]  Insulin Glargine (LANTUS) 100 UNIT/ML Solostar Pen Inject 50 Units into the skin daily.    Yes [provider]  metoprolol (TOPROL-XL) 200 MG 24 hr tablet Take 200 mg by mouth daily.   Yes [provider]  Multiple Vitamins-Minerals (TAB-A-VITE) TABS Take 1 tablet by mouth daily. 07/27/19  Yes [provider]  NOVOLOG MIX 70/30 (70-30) 100 UNIT/ML injection Inject into the  skin 2 (two) times daily with a meal.  10/28/19  Yes [provider]  omeprazole (PRILOSEC) 40 MG capsule Take 40 mg by mouth daily.   Yes [provider]  tamsulosin (FLOMAX) 0.4 MG CAPS capsule Take 0.4 mg by mouth daily.   Yes [provider]  venlafaxine XR (EFFEXOR-XR) 150 MG 24 hr capsule Take 150 mg by mouth daily with breakfast.   Yes [provider]  albuterol (VENTOLIN HFA) 108 (90 Base) MCG/ACT inhaler  07/27/19   [provider]  aspirin 81 MG EC tablet Take 81 mg by mouth daily.  03/10/16   [provider]  traZODone (DESYREL) 100 MG tablet Take 100 mg by mouth at  bedtime.    [provider]    Allergies    Ace inhibitors and Penicillins  Review of Systems   Review of Systems  Constitutional: Positive for appetite change. Negative for chills and fever.  HENT: Positive for sore throat. Negative for congestion.   Eyes: Negative.   Respiratory: Positive for shortness of breath and wheezing. Negative for cough and chest tightness.   Cardiovascular: Negative for chest pain.  Gastrointestinal: Positive for nausea. Negative for abdominal pain and vomiting.  Genitourinary: Negative.   Musculoskeletal: Positive for myalgias. Negative for arthralgias, joint swelling and neck pain.  Skin: Negative.  Negative for rash and wound.  Neurological: Negative for dizziness, weakness, light-headedness, numbness and headaches.  Psychiatric/Behavioral: Negative.     Physical Exam Updated Vital Signs BP (!) 162/77   Pulse 78   Temp (!) 97.3 F (36.3 C) (Oral)   Resp (!) 22   Ht 5\' 9"  (1.753 m)   Wt 127 kg   SpO2 95%   BMI 41.35 kg/m   Physical Exam Vitals and nursing note reviewed.  Constitutional:      Appearance: He is obese.  HENT:     Head: Normocephalic and atraumatic.     Mouth/Throat:     Pharynx: No pharyngeal swelling or oropharyngeal exudate.     Comments: Dry buccal mucosa Eyes:     Conjunctiva/sclera: Conjunctivae normal.  Cardiovascular:     Rate and Rhythm: Normal rate and regular rhythm.     Heart sounds: Normal heart sounds.  Pulmonary:     Effort: Pulmonary effort is normal.     Breath sounds: Decreased breath sounds present. No wheezing, rhonchi or rales.     Comments: Decreased breath sounds throughout.  Oxygen saturation remains at 100%, however patient has difficulty speaking in full sentences.  No accessory muscle use noted. Abdominal:     General: Bowel sounds are normal.     Palpations: Abdomen is soft.     Tenderness: There is no abdominal tenderness.  Musculoskeletal:        General: Normal range of motion.       Cervical back: Normal range of motion.     Comments: Trace nonpitting edema bilateral ankles.  No calf tenderness, no asymmetry.  Skin:    General: Skin is warm and dry.  Neurological:     Mental Status: He is alert.     ED Results / Procedures / Treatments   Labs (all labs ordered are listed, but only abnormal results are displayed) Labs Reviewed  CBC WITH DIFFERENTIAL/PLATELET - Abnormal; Notable for the following components:      Result Value   WBC 17.6 (*)    Hemoglobin 12.5 (*)    HCT 38.4 (*)    Neutro Abs 11.1 (*)  Monocytes Absolute 1.4 (*)    Eosinophils Absolute 0.6 (*)    Abs Immature Granulocytes 0.53 (*)    All other components within normal limits  COMPREHENSIVE METABOLIC PANEL - Abnormal; Notable for the following components:   Sodium 134 (*)    Chloride 94 (*)    Glucose, Bld 176 (*)    All other components within normal limits  BRAIN NATRIURETIC PEPTIDE - Abnormal; Notable for the following components:   B Natriuretic Peptide 134.0 (*)    All other components within normal limits  TROPONIN I (HIGH SENSITIVITY) - Abnormal; Notable for the following components:   Troponin I (High Sensitivity) 74 (*)    All other components within normal limits  RESPIRATORY PANEL BY RT PCR (FLU A&B, COVID)  D-DIMER, QUANTITATIVE (NOT AT [facility])  POC SARS CORONAVIRUS 2 AG -  ED    EKG EKG Interpretation  Date/Time:  Tuesday November 15 2019 10:46:46 EST Ventricular Rate:  77 PR Interval:    QRS Duration: 113 QT Interval:  431 QTC Calculation: 488 R Axis:   11 Text Interpretation: Sinus rhythm Borderline intraventricular conduction delay Borderline prolonged QT interval Baseline wander in lead(s) V1 Confirmed by Virgel Manifold [phone]) on 11/15/2019 1:45:05 PM   Radiology DG Chest Portable 1 View  Result Date: 11/15/2019 CLINICAL DATA:  Chest pain, weakness and shortness of breath. EXAM: PORTABLE CHEST 1 VIEW COMPARISON:  PA and lateral chest 12/11/2017. FINDINGS:  Eventration of the right hemidiaphragm is unchanged. Lungs clear. Heart size normal. No pneumothorax or pleural effusion. No acute or focal bony abnormality. IMPRESSION: Negative chest. Electronically Signed   By: Inge Rise M.D.   On: 11/15/2019 12:01    Procedures Procedures (including critical care time)  Medications Ordered in ED Medications  AeroChamber Plus Flo-Vu Medium MISC 1 each (1 each Other Not Given 11/15/19 1140)  furosemide (LASIX) injection 40 mg (has no administration in time range)  albuterol (VENTOLIN HFA) 108 (90 Base) MCG/ACT inhaler 4 puff (4 puffs Inhalation Given 11/15/19 1123)  sodium chloride 0.9 % bolus 500 mL (0 mLs Intravenous Stopped 11/15/19 1649)  ondansetron (ZOFRAN) injection 4 mg (4 mg Intravenous Given 11/15/19 1133)    ED Course  I have reviewed the triage vital signs and the nursing notes.  Pertinent labs & imaging results that were available during my care of the patient were reviewed by me and considered in my medical decision making (see chart for details).    MDM Rules/Calculators/A&P                      Pt with a history of HTN and DM with persistent sob, denies cp, but endorses tight sensation with respirations.  No infectious source including Covid (confirmatory competed) influenza negative.  CXR negative for acute pneumonia.  BNP elevated, also troponin elevated at 74.  Heart score of 3.  Per Heart pathway, pt would benefit from overnight obs to cycle troponins.  He also does have a modestly elevated BNP, no effusion or interstitial edema on cxr, but does have peripheral edema.  Lasix IV ordered. Sob possibly from new onset CHF, vs ACS equivalent.   D dimer negative.   Discussed results with pt.  He is agreeable to stay.  Still remains sob, although not hypoxic nor tachypneic at this time.    Call placed to Dr. Velia Meyer who accepts pt for admission.  Final Clinical Impression(s) / ED Diagnoses Final diagnoses:  Shortness of breath    Rx /  DC Orders ED Discharge Orders    None       Landis Martins 11/15/19 1741    Virgel Manifold, MD 11/18/19 1000

## 2019-11-15 NOTE — Progress Notes (Signed)
Brief note regarding plan, with full H&P to follow:  59 year old male with history of chronic right hemidiaphragm, hypertension, type 2 diabetes mellitus, who is admitted to [facility] for further work-up and management of acute bronchitis with bronchospasm after presenting from home to the emergency department complaining of 4 to 5 days of progressive shortness of breath associated with generalized myalgias and wheezing.  Chest x-ray shows no evidence of acute cardiopulmonary process, while Covid PCR found to be negative. Will start scheduled Combivent inhaler, as needed albuterol, Solu-Medrol. Will also start doxycycline given that the patient has completed a course of azithromycin as an outpatient, without significant ensuing improvement.  Check procalcitonin.  Mildly elevated initial troponin in the absence of any associated chest pain, with EKG showing no evidence of acute ischemic changes.  ACS felt to be less likely.  Status post full dose aspirin x1.  Will repeat troponin and monitor on telemetry while enacting work-up and management for suspected underlying acute bronchitis with bronchospasm, as above.    Babs Bertin, DO Hospitalist

## 2019-11-15 NOTE — ED Notes (Signed)
Report given to RN on 300 

## 2019-11-16 DIAGNOSIS — E11649 Type 2 diabetes mellitus with hypoglycemia without coma: Secondary | ICD-10-CM

## 2019-11-16 DIAGNOSIS — F32A Depression, unspecified: Secondary | ICD-10-CM | POA: Diagnosis present

## 2019-11-16 DIAGNOSIS — E119 Type 2 diabetes mellitus without complications: Secondary | ICD-10-CM

## 2019-11-16 DIAGNOSIS — F329 Major depressive disorder, single episode, unspecified: Secondary | ICD-10-CM | POA: Diagnosis present

## 2019-11-16 DIAGNOSIS — R0602 Shortness of breath: Secondary | ICD-10-CM | POA: Diagnosis present

## 2019-11-16 DIAGNOSIS — R6520 Severe sepsis without septic shock: Secondary | ICD-10-CM | POA: Diagnosis present

## 2019-11-16 DIAGNOSIS — I10 Essential (primary) hypertension: Secondary | ICD-10-CM | POA: Diagnosis present

## 2019-11-16 DIAGNOSIS — A419 Sepsis, unspecified organism: Secondary | ICD-10-CM | POA: Diagnosis present

## 2019-11-16 DIAGNOSIS — Z794 Long term (current) use of insulin: Secondary | ICD-10-CM

## 2019-11-16 LAB — URINALYSIS, ROUTINE W REFLEX MICROSCOPIC
Bacteria, UA: NONE SEEN
Bilirubin Urine: NEGATIVE
Glucose, UA: NEGATIVE mg/dL
Ketones, ur: NEGATIVE mg/dL
Leukocytes,Ua: NEGATIVE
Nitrite: NEGATIVE
Protein, ur: 100 mg/dL — AB
RBC / HPF: >50 RBC/hpf — ABNORMAL HIGH (ref 0–5)
Specific Gravity, Urine: 1.008 (ref 1.005–1.030)
pH: 7 (ref 5.0–8.0)

## 2019-11-16 LAB — CBC WITH DIFFERENTIAL/PLATELET
Abs Immature Granulocytes: 0.45 10*3/uL — ABNORMAL HIGH (ref 0.00–0.07)
Basophils Absolute: 0.2 10*3/uL — ABNORMAL HIGH (ref 0.0–0.1)
Basophils Relative: 1 %
Eosinophils Absolute: 0.4 10*3/uL (ref 0.0–0.5)
Eosinophils Relative: 2 %
HCT: 39.7 % (ref 39.0–52.0)
Hemoglobin: 13 g/dL (ref 13.0–17.0)
Immature Granulocytes: 2 %
Lymphocytes Relative: 22 %
Lymphs Abs: 4.1 10*3/uL — ABNORMAL HIGH (ref 0.7–4.0)
MCH: 28.6 pg (ref 26.0–34.0)
MCHC: 32.7 g/dL (ref 30.0–36.0)
MCV: 87.3 fL (ref 80.0–100.0)
Monocytes Absolute: 1.5 10*3/uL — ABNORMAL HIGH (ref 0.1–1.0)
Monocytes Relative: 8 %
Neutro Abs: 12.1 10*3/uL — ABNORMAL HIGH (ref 1.7–7.7)
Neutrophils Relative %: 65 %
Platelets: 383 10*3/uL (ref 150–400)
RBC: 4.55 MIL/uL (ref 4.22–5.81)
RDW: 12.8 % (ref 11.5–15.5)
WBC: 18.6 10*3/uL — ABNORMAL HIGH (ref 4.0–10.5)
nRBC: 0 % (ref 0.0–0.2)

## 2019-11-16 LAB — BASIC METABOLIC PANEL
Anion gap: 12 (ref 5–15)
BUN: 19 mg/dL (ref 6–20)
CO2: 27 mmol/L (ref 22–32)
Calcium: 9 mg/dL (ref 8.9–10.3)
Chloride: 96 mmol/L — ABNORMAL LOW (ref 98–111)
Creatinine, Ser: 0.87 mg/dL (ref 0.61–1.24)
GFR calc Af Amer: >60 mL/min (ref >60)
GFR calc non Af Amer: >60 mL/min (ref >60)
Glucose, Bld: 113 mg/dL — ABNORMAL HIGH (ref 70–99)
Potassium: 3.7 mmol/L (ref 3.5–5.1)
Sodium: 135 mmol/L (ref 135–145)

## 2019-11-16 LAB — TROPONIN I (HIGH SENSITIVITY): Troponin I (High Sensitivity): 48 ng/L — ABNORMAL HIGH (ref <18)

## 2019-11-16 LAB — MAGNESIUM: Magnesium: 1.7 mg/dL (ref 1.7–2.4)

## 2019-11-16 LAB — PHOSPHORUS: Phosphorus: 4.6 mg/dL (ref 2.5–4.6)

## 2019-11-16 LAB — GLUCOSE, CAPILLARY
Glucose-Capillary: 270 mg/dL — ABNORMAL HIGH (ref 70–99)
Glucose-Capillary: 338 mg/dL — ABNORMAL HIGH (ref 70–99)
Glucose-Capillary: 321 mg/dL — ABNORMAL HIGH (ref 70–99)

## 2019-11-16 LAB — HIV ANTIBODY (ROUTINE TESTING W REFLEX): HIV Screen 4th Generation wRfx: NONREACTIVE

## 2019-11-16 LAB — NOVEL CORONAVIRUS, NAA: SARS-CoV-2, NAA: NOT DETECTED

## 2019-11-16 LAB — HEMOGLOBIN A1C
Hgb A1c MFr Bld: 9.8 % — ABNORMAL HIGH (ref 4.8–5.6)
Mean Plasma Glucose: 234.56 mg/dL

## 2019-11-16 MED ORDER — INSULIN ASPART 100 UNIT/ML ~~LOC~~ SOLN
14.0000 [IU] | Freq: Three times a day (TID) | SUBCUTANEOUS | Status: DC
  Administered 2019-11-16: 14 [IU] via SUBCUTANEOUS

## 2019-11-16 MED ORDER — IPRATROPIUM-ALBUTEROL 20-100 MCG/ACT IN AERS
1.0000 | INHALATION_SPRAY | Freq: Three times a day (TID) | RESPIRATORY_TRACT | Status: DC
  Administered 2019-11-17: 1 via RESPIRATORY_TRACT

## 2019-11-16 MED ORDER — METHYLPREDNISOLONE SODIUM SUCC 40 MG IJ SOLR
40.0000 mg | Freq: Two times a day (BID) | INTRAMUSCULAR | Status: DC
  Administered 2019-11-17: 40 mg via INTRAVENOUS
  Filled 2019-11-16: qty 1

## 2019-11-16 NOTE — Progress Notes (Signed)
PROGRESS NOTE [facility]   NOCHUM AKRIDGE  [id number]  DOB: 02/01/1961  DOA: 11/15/2019 PCP: Patient, No Pcp Per   Brief Admission Hx: 59 y.o. male with medical history significant for chronic right hemidiaphragm, hypertension, type 2 diabetes mellitus, who is admitted to [facility] on 11/15/2019 with acute bronchitis with bronchospasm.   MDM/Assessment & Plan:   1. Acute bronchitis - Pt reports symptoms are improving with treatments, with improved wheezing, decrease IV steroids to every 12 hours, continue combivent inhaler, continue doxycycline.  2. Sepsis - his sepsis physiology has resolved with treatments.  3. Essential hypertension - controlled on home regimen, follow.  4. Type 2 DM with hyperglycemia - continue SSI, add prandial coverage, reducing IV steroids.  5. Depression - resumed home effexor.  6. GERD - protonix ordered for GI protection.  7. BPH - on flomax.   8. Gait instability - pt requesting wheelchair, PT eval requested.   DVT prophylaxis: lovenox Code Status: full  Family Communication:  Disposition Plan: continue IV steroids, antibiotics and supportive care    Consultants:    Procedures:    Antimicrobials:  Doxycycline    Subjective: Pt reports SOB and cough.  Not able to ambulate due to disability.    Objective: Vitals:   11/16/19 0458 11/16/19 0748 11/16/19 1344 11/16/19 1439  BP: (!) 160/83  135/71   Pulse: 88  81   Resp: 16  20   Temp: 98.6 F (37 C)  98 F (36.7 C)   TempSrc: Oral  Oral   SpO2: 92% 97% 95% 96%  Weight:      Height:        Intake/Output Summary (Last 24 hours) at 11/16/2019 1541 Last data filed at 11/16/2019 1300 Gross per 24 hour  Intake 960 ml  Output 3300 ml  Net -2340 ml   Filed Weights   11/15/19 2127 11/16/19 0440 11/16/19 0441  Weight: 131.5 kg 132.1 kg 131.9 kg     REVIEW OF SYSTEMS  As per history otherwise all reviewed and reported negative  Exam:  General exam: awake,  alert, NAD, cooperative.  Respiratory system: rare exp wheezes heard.  No increased work of breathing. Cardiovascular system: S1 & S2 heard. No JVD, murmurs, gallops, clicks or pedal edema. Gastrointestinal system: Abdomen is nondistended, soft and nontender. Normal bowel sounds heard. Central nervous system: Alert and oriented. No focal neurological deficits. Extremities: no CCE.  Data Reviewed: Basic Metabolic Panel: Recent Labs  Lab 11/15/19 1136 11/15/19 1520 11/15/19 2300  NA 134*  --  135  K 3.5  --  3.7  CL 94*  --  96*  CO2 28  --  27  GLUCOSE 176*  --  113*  BUN 20  --  19  CREATININE 0.85  --  0.87  CALCIUM 9.1  --  9.0  MG  --  1.5* 1.7  PHOS  --   --  4.6   Liver Function Tests: Recent Labs  Lab 11/15/19 1136  AST 21  ALT 20  ALKPHOS 98  BILITOT 0.6  PROT 7.3  ALBUMIN 3.5   No results for input(s): LIPASE, AMYLASE in the last 168 hours. No results for input(s): AMMONIA in the last 168 hours. CBC: Recent Labs  Lab 11/15/19 1136 11/15/19 2300  WBC 17.6* 18.6*  NEUTROABS 11.1* 12.1*  HGB 12.5* 13.0  HCT 38.4* 39.7  MCV 86.1 87.3  PLT 354 383   Cardiac Enzymes: No results for input(s): CKTOTAL, CKMB, CKMBINDEX,  TROPONINI in the last 168 hours. CBG (last 3)  Recent Labs    11/15/19 2129 11/16/19 0714 11/16/19 1119  GLUCAP 120* 270* 338*   Recent Results (from the past 240 hour(s))  Respiratory Panel by RT PCR (Flu A&B, Covid) - Nasopharyngeal Swab     Status: None   Collection Time: 11/15/19  2:36 PM   Specimen: Nasopharyngeal Swab  Result Value Ref Range Status   SARS Coronavirus 2 by RT PCR NEGATIVE NEGATIVE Final    Comment: (NOTE) SARS-CoV-2 target nucleic acids are NOT DETECTED. The SARS-CoV-2 RNA is generally detectable in upper respiratoy specimens during the acute phase of infection. The lowest concentration of SARS-CoV-2 viral copies this assay can detect is 131 copies/mL. A negative result does not preclude SARS-Cov-2 infection  and should not be used as the sole basis for treatment or other patient management decisions. A negative result may occur with  improper specimen collection/handling, submission of specimen other than nasopharyngeal swab, presence of viral mutation(s) within the areas targeted by this assay, and inadequate number of viral copies (<131 copies/mL). A negative result must be combined with clinical observations, patient history, and epidemiological information. The expected result is Negative. Fact Sheet for Patients:  PinkCheek.be Fact Sheet for Healthcare Providers:  GravelBags.it This test is not yet ap proved or cleared by the [place] FDA and  has been authorized for detection and/or diagnosis of SARS-CoV-2 by FDA under an Emergency Use Authorization (EUA). This EUA will remain  in effect (meaning this test can be used) for the duration of the COVID-19 declaration under Section 564(b)(1) of the Act, 21 U.S.C. section 360bbb-3(b)(1), unless the authorization is terminated or revoked sooner.    Influenza A by PCR NEGATIVE NEGATIVE Final   Influenza B by PCR NEGATIVE NEGATIVE Final    Comment: (NOTE) The Xpert Xpress SARS-CoV-2/FLU/RSV assay is intended as an aid in  the diagnosis of influenza from Nasopharyngeal swab specimens and  should not be used as a sole basis for treatment. Nasal washings and  aspirates are unacceptable for Xpert Xpress SARS-CoV-2/FLU/RSV  testing. Fact Sheet for Patients: PinkCheek.be Fact Sheet for Healthcare Providers: GravelBags.it This test is not yet approved or cleared by the [place] FDA and  has been authorized for detection and/or diagnosis of SARS-CoV-2 by  FDA under an Emergency Use Authorization (EUA). This EUA will remain  in effect (meaning this test can be used) for the duration of the  Covid-19 declaration under Section  564(b)(1) of the Act, 21  U.S.C. section 360bbb-3(b)(1), unless the authorization is  terminated or revoked. Performed at [facility], [address], [place], [place] [address]      Studies: DG Chest Portable 1 View  Result Date: 11/15/2019 CLINICAL DATA:  Chest pain, weakness and shortness of breath. EXAM: PORTABLE CHEST 1 VIEW COMPARISON:  PA and lateral chest 12/11/2017. FINDINGS: Eventration of the right hemidiaphragm is unchanged. Lungs clear. Heart size normal. No pneumothorax or pleural effusion. No acute or focal bony abnormality. IMPRESSION: Negative chest. Electronically Signed   By: Inge Rise M.D.   On: 11/15/2019 12:01     Scheduled Meds: . AeroChamber Plus Flo-Vu Medium  1 each Other Once  . amLODipine  10 mg Oral Daily  . aspirin EC  81 mg Oral Daily  . atorvastatin  40 mg Oral Daily  . doxycycline  100 mg Oral Q12H  . enoxaparin (LOVENOX) injection  40 mg Subcutaneous Q24H  . gabapentin  300 mg Oral BID  .  insulin aspart  0-15 Units Subcutaneous TID WC  . insulin aspart  0-5 Units Subcutaneous QHS  . insulin aspart  14 Units Subcutaneous TID WC  . insulin glargine  20 Units Subcutaneous BID  . Ipratropium-Albuterol  1 puff Inhalation Q6H WA  . [START ON 11/17/2019] methylPREDNISolone (SOLU-MEDROL) injection  40 mg Intravenous Q12H  . metoprolol  200 mg Oral Daily  . pantoprazole  40 mg Oral Daily  . sodium chloride flush  3 mL Intravenous Q12H  . traZODone  100 mg Oral QHS  . venlafaxine XR  150 mg Oral Q breakfast   Continuous Infusions:  Principal Problem:   Acute bronchitis with bronchospasm Active Problems:   SOB (shortness of breath)   Sepsis ([place])   Hypertension   Diabetes mellitus without complication ([place])   Depression  Time spent:   Irwin Brakeman, MD [facility] 11/16/2019, 3:41 PM    LOS: 1 day  How to contact the [facility] Attending or Consulting provider [place] or covering provider during after hours [place], for this patient?   1. Check the care team in [place] and look for a) attending/consulting [facility] provider listed and b) the [facility] team listed 2. Log into [email or web address] and use [place]'s universal password to access. If you do not have the password, please contact the hospital operator. 3. Locate the [facility] provider you are looking for under [facility] and page to a number that you can be directly reached. 4. If you still have difficulty reaching the provider, please page the [facility] (Director on Call) for the Hospitalists listed on amion for assistance.

## 2019-11-17 LAB — GLUCOSE, CAPILLARY
Glucose-Capillary: 297 mg/dL — ABNORMAL HIGH (ref 70–99)
Glucose-Capillary: 330 mg/dL — ABNORMAL HIGH (ref 70–99)

## 2019-11-17 MED ORDER — INSULIN ASPART PROT & ASPART (70-30 MIX) 100 UNIT/ML ~~LOC~~ SUSP
60.0000 [IU] | Freq: Two times a day (BID) | SUBCUTANEOUS | Status: DC
  Filled 2019-11-17: qty 10

## 2019-11-17 MED ORDER — DOXYCYCLINE HYCLATE 100 MG PO TABS: 100.0000 mg | ORAL_TABLET | Freq: Two times a day (BID) | ORAL | 0 refills | Status: AC

## 2019-11-17 MED ORDER — DOXYCYCLINE HYCLATE 100 MG PO TABS: 100.0000 mg | ORAL_TABLET | Freq: Two times a day (BID) | ORAL | 0 refills | Status: DC

## 2019-11-17 NOTE — Discharge Summary (Signed)
Physician Discharge Summary  Shane Chambers [id number] DOB: 1961-01-02 DOA: 11/15/2019  PCP: Patient, No Pcp Per  Admit date: 11/15/2019 Discharge date: 11/17/2019  Admitted From:  Home  Disposition:  Home   Recommendations for Outpatient Follow-up:  1. Follow up with PCP in 1 weeks 2. Monitor blood sugars closely for next 2 weeks  Home Health: PT, RN, SW  Discharge Condition: STABLE   CODE STATUS: FULL    Brief Hospitalization Summary: Please see all hospital notes, images, labs for full details of the hospitalization. ADMISSION HPI: Shane Chambers is a 59 y.o. male with medical history significant for chronic right hemidiaphragm, hypertension, type 2 diabetes mellitus, who is admitted to [facility] on 11/15/2019 with suspected acute bronchitis with bronchospasm after presenting from home to [facility] complaining of shortness of breath.  The patient reports 4 to 5 days of progressive shortness of breath associated with nonproductive cough, generalized myalgias, and wheezing.  Denies any associated subjective fever, chills, or rigors.  Denies any associated neck stiffness, rhinitis, rhinorrhea, sore throat.  Reports associated nausea in the absence of any vomiting.  Denies any recent abdominal discomfort, diarrhea, melena, hematochezia, or rash.  Denies any recent traveling or known COVID-19 exposures.  Presentation not associate with any hemoptysis, calf tenderness, or erythema in the lower extremities.  Denies any associated chest pain, palpitations, diaphoresis.  Also denies any associated orthopnea, PND, or worsening of peripheral edema.  Denies any recent trauma.   The patient had presented to his PCP late last week for evaluation of the above.  At that time he was started on a Z-Pak as well as a short course of prednisone.  He reports good compliance in taking both of these medications, and notes that he has completed his Z-Pak, while he has 1 more dose of  prednisone remaining.  Patient acknowledges that he is a former smoker, but denies any known underlying COPD.   ED Course:  Vital signs in the ED were notable for the following: Temperature max 97.3; heart rate 74-78; blood pressure ranged from 152/73-160 2/77; respiratory rate 18-24, and oxygen saturation 95 to 98% on room air.  Labs were notable for the following: CMP notable for sodium 134, bicarbonate 28, creatinine 0.85, and liver enzymes found to be within normal limits.  BNP 134, with no prior value available for point comparison.  High-sensitivity troponin I x1 found to be 74, with no prior data point available for comparison.  CBC notable for white blood cell count of 17,600 with 63% neutrophils; D-dimer 0.41.  COVID-19 PCR found to be negative.  Influenza AMB 1 view chest x-ray, per final radiology report, showed unchanged right hemidiaphragm and no evidence of acute cardiopulmonary process, including no evidence of infiltrate, edema, effusion, or pneumothorax.  EKG showed sinus rhythm with heart rate 77, mildly prolonged QTC of 488 ms, and no evidence of T wave or ST changes.  While in the ED, the following were administered: Albuterol inhaler x1; Lasix 40 mg IV x1, and a 500 cc IV normal saline bolus.  Brief Admission Hx: 58 y.o.malewith medical history significant forchronic right hemidiaphragm, hypertension, type 2 diabetes mellitus,who is [facility] to [facility] on 1/5/2021with acute bronchitis with bronchospasm.   MDM/Assessment & Plan:   1. Acute bronchitis - Pt reports symptoms are improved with treatments, with improved wheezing, was treated with IV steroids, combivent inhaler, continue doxycycline.  DC home with close outpatient follow up.  Covid negative.  2. Sepsis -  RESOLVED.  His sepsis physiology has resolved with treatments.  3. Essential hypertension - controlled on home regimen, follow.  4. Type 2 DM with hyperglycemia - resume home regimen, follow  CBGs closely and close outpatient follow up.  5. Depression - resumed home effexor.  6. GERD - protonix was ordered for GI protection.  7. BPH - on flomax.   8. Gait instability - pt requesting wheelchair, PT eval requested. HHPT ordered.    DVT prophylaxis: lovenox Code Status: full  Family Communication:  Disposition Plan: Home    Discharge Diagnoses:  Principal Problem:   Acute bronchitis with bronchospasm Active Problems:   SOB (shortness of breath)   Sepsis ([place])   Hypertension   Diabetes mellitus without complication ([place])   Depression   Discharge Instructions:  Allergies as of 11/17/2019      Reactions   Ace Inhibitors Swelling, Cough   Other Itching   [name]   Penicillins    Has patient had a PCN reaction causing immediate rash, facial/tongue/throat swelling, SOB or lightheadedness with hypotension: Unknown Has patient had a PCN reaction causing severe rash involving mucus membranes or skin necrosis: Unknown Has patient had a PCN reaction that required hospitalization: Unknown Has patient had a PCN reaction occurring within the last 10 years: Unknown If all of the above answers are "NO", then may proceed with Cephalosporin use.      Medication List    STOP taking these medications   azithromycin 250 MG tablet Commonly known as: ZITHROMAX   predniSONE 5 MG (48) Tbpk tablet Commonly known as: STERAPRED UNI-PAK 48 TAB     TAKE these medications   albuterol 108 (90 Base) MCG/ACT inhaler Commonly known as: VENTOLIN HFA Inhale 1-2 puffs into the lungs every 6 (six) hours as needed for wheezing or shortness of breath.   amLODipine 10 MG tablet Commonly known as: NORVASC Take 10 mg by mouth daily.   aspirin 81 MG EC tablet Take 81 mg by mouth daily.   atorvastatin 40 MG tablet Commonly known as: LIPITOR Take 40 mg by mouth daily.   chlorthalidone 25 MG tablet Commonly known as: HYGROTON Take 25 mg by mouth daily.   DAYQUIL MULTI-SYMPTOM COLD/FLU  PO Take 1-2 capsules by mouth daily as needed (cold symptoms).   diclofenac Sodium 1 % Gel Commonly known as: VOLTAREN Apply 2 g topically daily as needed (for pain).   doxycycline 100 MG tablet Commonly known as: VIBRA-TABS Take 1 tablet (100 mg total) by mouth every 12 (twelve) hours for 3 days.   fluticasone 27.5 MCG/SPRAY nasal spray Commonly known as: VERAMYST Place 2 sprays into the nose daily as needed for rhinitis or allergies.   gabapentin 300 MG capsule Commonly known as: NEURONTIN Take 900 mg by mouth 2 (two) times daily.   metFORMIN 1000 MG tablet Commonly known as: GLUCOPHAGE Take 1,000 mg by mouth 2 (two) times daily with a meal.   metoprolol 200 MG 24 hr tablet Commonly known as: TOPROL-XL Take 200 mg by mouth daily.   NovoLOG Mix 70/30 (70-30) 100 UNIT/ML injection Generic drug: insulin aspart protamine- aspart Inject 65 Units into the skin 2 (two) times daily with a meal.   omeprazole 40 MG capsule Commonly known as: PRILOSEC Take 40 mg by mouth daily.   Tab-A-Vite Tabs Take 1 tablet by mouth daily.   tamsulosin 0.4 MG Caps capsule Commonly known as: FLOMAX Take 0.4 mg by mouth daily.   venlafaxine XR 150 MG 24 hr capsule Commonly  known as: EFFEXOR-XR Take 150 mg by mouth daily with breakfast.            Durable Medical Equipment  (From admission, onward)         Start     Ordered   11/16/19 0956  For home use only DME lightweight manual wheelchair with seat cushion  Once    Comments: Patient suffers from gait instability and disability which impairs their ability to perform daily activities like bathing, dressing, feeding and grooming in the home.  A cane, crutch or walker will not resolve  issue with performing activities of daily living. A wheelchair will allow patient to safely perform daily activities. Patient is not able to propel themselves in the home using a standard weight wheelchair due to arm weakness, endurance and general  weakness. Patient can self propel in the lightweight wheelchair. Length of need Lifetime. Accessories: elevating leg rests (ELRs), wheel locks, extensions and anti-tippers.   11/16/19 0956         Follow-up Information    Primary Care Provider. Schedule an appointment as soon as possible for a visit in 1 week(s).          Allergies  Allergen Reactions  . Ace Inhibitors Swelling and Cough  . Other Itching    [name]  . Penicillins     Has patient had a PCN reaction causing immediate rash, facial/tongue/throat swelling, SOB or lightheadedness with hypotension: Unknown Has patient had a PCN reaction causing severe rash involving mucus membranes or skin necrosis: Unknown Has patient had a PCN reaction that required hospitalization: Unknown Has patient had a PCN reaction occurring within the last 10 years: Unknown If all of the above answers are "NO", then may proceed with Cephalosporin use.    Allergies as of 11/17/2019      Reactions   Ace Inhibitors Swelling, Cough   Other Itching   [name]   Penicillins    Has patient had a PCN reaction causing immediate rash, facial/tongue/throat swelling, SOB or lightheadedness with hypotension: Unknown Has patient had a PCN reaction causing severe rash involving mucus membranes or skin necrosis: Unknown Has patient had a PCN reaction that required hospitalization: Unknown Has patient had a PCN reaction occurring within the last 10 years: Unknown If all of the above answers are "NO", then may proceed with Cephalosporin use.      Medication List    STOP taking these medications   azithromycin 250 MG tablet Commonly known as: ZITHROMAX   predniSONE 5 MG (48) Tbpk tablet Commonly known as: STERAPRED UNI-PAK 48 TAB     TAKE these medications   albuterol 108 (90 Base) MCG/ACT inhaler Commonly known as: VENTOLIN HFA Inhale 1-2 puffs into the lungs every 6 (six) hours as needed for wheezing or shortness of breath.   amLODipine 10  MG tablet Commonly known as: NORVASC Take 10 mg by mouth daily.   aspirin 81 MG EC tablet Take 81 mg by mouth daily.   atorvastatin 40 MG tablet Commonly known as: LIPITOR Take 40 mg by mouth daily.   chlorthalidone 25 MG tablet Commonly known as: HYGROTON Take 25 mg by mouth daily.   DAYQUIL MULTI-SYMPTOM COLD/FLU PO Take 1-2 capsules by mouth daily as needed (cold symptoms).   diclofenac Sodium 1 % Gel Commonly known as: VOLTAREN Apply 2 g topically daily as needed (for pain).   doxycycline 100 MG tablet Commonly known as: VIBRA-TABS Take 1 tablet (100 mg total) by mouth every 12 (twelve)  hours for 3 days.   fluticasone 27.5 MCG/SPRAY nasal spray Commonly known as: VERAMYST Place 2 sprays into the nose daily as needed for rhinitis or allergies.   gabapentin 300 MG capsule Commonly known as: NEURONTIN Take 900 mg by mouth 2 (two) times daily.   metFORMIN 1000 MG tablet Commonly known as: GLUCOPHAGE Take 1,000 mg by mouth 2 (two) times daily with a meal.   metoprolol 200 MG 24 hr tablet Commonly known as: TOPROL-XL Take 200 mg by mouth daily.   NovoLOG Mix 70/30 (70-30) 100 UNIT/ML injection Generic drug: insulin aspart protamine- aspart Inject 65 Units into the skin 2 (two) times daily with a meal.   omeprazole 40 MG capsule Commonly known as: PRILOSEC Take 40 mg by mouth daily.   Tab-A-Vite Tabs Take 1 tablet by mouth daily.   tamsulosin 0.4 MG Caps capsule Commonly known as: FLOMAX Take 0.4 mg by mouth daily.   venlafaxine XR 150 MG 24 hr capsule Commonly known as: EFFEXOR-XR Take 150 mg by mouth daily with breakfast.            Durable Medical Equipment  (From admission, onward)         Start     Ordered   11/16/19 0956  For home use only DME lightweight manual wheelchair with seat cushion  Once    Comments: Patient suffers from gait instability and disability which impairs their ability to perform daily activities like bathing, dressing,  feeding and grooming in the home.  A cane, crutch or walker will not resolve  issue with performing activities of daily living. A wheelchair will allow patient to safely perform daily activities. Patient is not able to propel themselves in the home using a standard weight wheelchair due to arm weakness, endurance and general weakness. Patient can self propel in the lightweight wheelchair. Length of need Lifetime. Accessories: elevating leg rests (ELRs), wheel locks, extensions and anti-tippers.   11/16/19 0956          Procedures/Studies: DG Chest Portable 1 View  Result Date: 11/15/2019 CLINICAL DATA:  Chest pain, weakness and shortness of breath. EXAM: PORTABLE CHEST 1 VIEW COMPARISON:  PA and lateral chest 12/11/2017. FINDINGS: Eventration of the right hemidiaphragm is unchanged. Lungs clear. Heart size normal. No pneumothorax or pleural effusion. No acute or focal bony abnormality. IMPRESSION: Negative chest. Electronically Signed   By: Inge Rise M.D.   On: 11/15/2019 12:01      Discharge Exam: Vitals:   11/16/19 2133 11/17/19 0549  BP: (!) 142/66 (!) 141/65  Pulse: 75 76  Resp: 20 18  Temp: 98.1 F (36.7 C) 98 F (36.7 C)  SpO2: 96% 97%   Vitals:   11/16/19 1953 11/16/19 2133 11/17/19 0500 11/17/19 0549  BP:  (!) 142/66  (!) 141/65  Pulse:  75  76  Resp:  20  18  Temp:  98.1 F (36.7 C)  98 F (36.7 C)  TempSrc:  Oral  Oral  SpO2: 96% 96%  97%  Weight:   131.6 kg   Height:       General exam: awake, alert, NAD, cooperative.  Respiratory system: clear to auscultation.  No increased work of breathing. Cardiovascular system: S1 & S2 heard. No JVD, murmurs, gallops, clicks or pedal edema. Gastrointestinal system: Abdomen is nondistended, soft and nontender. Normal bowel sounds heard. Central nervous system: Alert and oriented. No focal neurological deficits. Extremities: no CCE.   The results of significant diagnostics from this hospitalization (including  imaging,  microbiology, ancillary and laboratory) are listed below for reference.     Microbiology: Recent Results (from the past 240 hour(s))  Novel Coronavirus, NAA ([facility])     Status: None   Collection Time: 11/15/19 10:11 AM   Specimen: Nasopharyngeal(NP) swabs in vial transport medium   NASOPHARYNGE  TESTING  Result Value Ref Range Status   SARS-CoV-2, NAA Not Detected Not Detected Final    Comment: This nucleic acid amplification test was developed and its performance characteristics determined by [employer], [employer] and [employer]. Nucleic acid amplification tests include PCR and TMA. This test has not been FDA cleared or approved. This test has been authorized by FDA under an Emergency Use Authorization (EUA). This test is only authorized for the duration of time the declaration that circumstances exist justifying the authorization of the emergency use of in vitro diagnostic tests for detection of SARS-CoV-2 virus and/or diagnosis of COVID-19 infection under section 564(b)(1) of the Act, 21 U.S.C. [id number]) (1), unless the authorization is terminated or revoked sooner. When diagnostic testing is negative, the possibility of a false negative result should be considered in the context of a patient's recent exposures and the presence of clinical signs and symptoms consistent with COVID-19. An individual without symptoms of COVID-19 and who is not shedding SARS-CoV-2 virus would  expect to have a negative (not detected) result in this assay.   Respiratory Panel by RT PCR (Flu A&B, Covid) - Nasopharyngeal Swab     Status: None   Collection Time: 11/15/19  2:36 PM   Specimen: Nasopharyngeal Swab  Result Value Ref Range Status   SARS Coronavirus 2 by RT PCR NEGATIVE NEGATIVE Final    Comment: (NOTE) SARS-CoV-2 target nucleic acids are NOT DETECTED. The SARS-CoV-2 RNA is generally detectable in upper respiratoy specimens during the acute phase of infection. The lowest concentration of  SARS-CoV-2 viral copies this assay can detect is 131 copies/mL. A negative result does not preclude SARS-Cov-2 infection and should not be used as the sole basis for treatment or other patient management decisions. A negative result may occur with  improper specimen collection/handling, submission of specimen other than nasopharyngeal swab, presence of viral mutation(s) within the areas targeted by this assay, and inadequate number of viral copies (<131 copies/mL). A negative result must be combined with clinical observations, patient history, and epidemiological information. The expected result is Negative. Fact Sheet for Patients:  PinkCheek.be Fact Sheet for Healthcare Providers:  GravelBags.it This test is not yet ap proved or cleared by the [place] FDA and  has been authorized for detection and/or diagnosis of SARS-CoV-2 by FDA under an Emergency Use Authorization (EUA). This EUA will remain  in effect (meaning this test can be used) for the duration of the COVID-19 declaration under Section 564(b)(1) of the Act, 21 U.S.C. section 360bbb-3(b)(1), unless the authorization is terminated or revoked sooner.    Influenza A by PCR NEGATIVE NEGATIVE Final   Influenza B by PCR NEGATIVE NEGATIVE Final    Comment: (NOTE) The Xpert Xpress SARS-CoV-2/FLU/RSV assay is intended as an aid in  the diagnosis of influenza from Nasopharyngeal swab specimens and  should not be used as a sole basis for treatment. Nasal washings and  aspirates are unacceptable for Xpert Xpress SARS-CoV-2/FLU/RSV  testing. Fact Sheet for Patients: PinkCheek.be Fact Sheet for Healthcare Providers: GravelBags.it This test is not yet approved or cleared by the [place] FDA and  has been authorized for detection and/or diagnosis of SARS-CoV-2 by  FDA under an Emergency Use Authorization (EUA).  This EUA will remain  in effect (meaning this test can be used) for the duration of the  Covid-19 declaration under Section 564(b)(1) of the Act, 21  U.S.C. section 360bbb-3(b)(1), unless the authorization is  terminated or revoked. Performed at [facility], [address] [place], [place] [address]      Labs: BNP (last 3 results) Recent Labs    11/15/19 1520  BNP [id number]*   Basic Metabolic Panel: Recent Labs  Lab 11/15/19 1136 11/15/19 1520 11/15/19 2300  NA 134*  --  135  K 3.5  --  3.7  CL 94*  --  96*  CO2 28  --  27  GLUCOSE 176*  --  113*  BUN 20  --  19  CREATININE 0.85  --  0.87  CALCIUM 9.1  --  9.0  MG  --  1.5* 1.7  PHOS  --   --  4.6   Liver Function Tests: Recent Labs  Lab 11/15/19 1136  AST 21  ALT 20  ALKPHOS 98  BILITOT 0.6  PROT 7.3  ALBUMIN 3.5   No results for input(s): LIPASE, AMYLASE in the last 168 hours. No results for input(s): AMMONIA in the last 168 hours. CBC: Recent Labs  Lab 11/15/19 1136 11/15/19 2300  WBC 17.6* 18.6*  NEUTROABS 11.1* 12.1*  HGB 12.5* 13.0  HCT 38.4* 39.7  MCV 86.1 87.3  PLT 354 383   Cardiac Enzymes: No results for input(s): CKTOTAL, CKMB, CKMBINDEX, TROPONINI in the last 168 hours. BNP: Invalid input(s): POCBNP CBG: Recent Labs  Lab 11/15/19 2129 11/16/19 0714 11/16/19 1119 11/16/19 1659  GLUCAP 120* 270* 338* 321*   D-Dimer Recent Labs    11/15/19 1136  DDIMER 0.41   Hgb A1c Recent Labs    11/15/19 2300  HGBA1C 9.8*   Lipid Profile No results for input(s): CHOL, HDL, LDLCALC, TRIG, CHOLHDL, LDLDIRECT in the last 72 hours. Thyroid function studies No results for input(s): TSH, T4TOTAL, T3FREE, THYROIDAB in the last 72 hours.  Invalid input(s): FREET3 Anemia work up No results for input(s): VITAMINB12, FOLATE, FERRITIN, TIBC, IRON, RETICCTPCT in the last 72 hours. Urinalysis    Component Value Date/Time   COLORURINE STRAW (A) 11/15/2019 2337   APPEARANCEUR CLEAR 11/15/2019  2337   LABSPEC 1.008 11/15/2019 2337   PHURINE 7.0 11/15/2019 2337   GLUCOSEU NEGATIVE 11/15/2019 2337   HGBUR MODERATE (A) 11/15/2019 2337   [place] NEGATIVE 11/15/2019 2337   [place] 11/15/2019 2337   PROTEINUR 100 (A) 11/15/2019 2337   NITRITE NEGATIVE 11/15/2019 2337   LEUKOCYTESUR NEGATIVE 11/15/2019 2337   Sepsis Labs Invalid input(s): PROCALCITONIN,  WBC,  LACTICIDVEN Microbiology Recent Results (from the past 240 hour(s))  Novel Coronavirus, NAA ([facility])     Status: None   Collection Time: 11/15/19 10:11 AM   Specimen: Nasopharyngeal(NP) swabs in vial transport medium   NASOPHARYNGE  TESTING  Result Value Ref Range Status   SARS-CoV-2, NAA Not Detected Not Detected Final    Comment: This nucleic acid amplification test was developed and its performance characteristics determined by [employer], [employer] and Company. Nucleic acid amplification tests include PCR and TMA. This test has not been FDA cleared or approved. This test has been authorized by FDA under an Emergency Use Authorization (EUA). This test is only authorized for the duration of time the declaration that circumstances exist justifying the authorization of the emergency use of in vitro diagnostic tests for detection of SARS-CoV-2 virus and/or diagnosis of COVID-19 infection under section 564(b)(1) of  the Act, 21 U.S.C. [id number]) (1), unless the authorization is terminated or revoked sooner. When diagnostic testing is negative, the possibility of a false negative result should be considered in the context of a patient's recent exposures and the presence of clinical signs and symptoms consistent with COVID-19. An individual without symptoms of COVID-19 and who is not shedding SARS-CoV-2 virus would  expect to have a negative (not detected) result in this assay.   Respiratory Panel by RT PCR (Flu A&B, Covid) - Nasopharyngeal Swab     Status: None   Collection Time: 11/15/19  2:36 PM   Specimen:  Nasopharyngeal Swab  Result Value Ref Range Status   SARS Coronavirus 2 by RT PCR NEGATIVE NEGATIVE Final    Comment: (NOTE) SARS-CoV-2 target nucleic acids are NOT DETECTED. The SARS-CoV-2 RNA is generally detectable in upper respiratoy specimens during the acute phase of infection. The lowest concentration of SARS-CoV-2 viral copies this assay can detect is 131 copies/mL. A negative result does not preclude SARS-Cov-2 infection and should not be used as the sole basis for treatment or other patient management decisions. A negative result may occur with  improper specimen collection/handling, submission of specimen other than nasopharyngeal swab, presence of viral mutation(s) within the areas targeted by this assay, and inadequate number of viral copies (<131 copies/mL). A negative result must be combined with clinical observations, patient history, and epidemiological information. The expected result is Negative. Fact Sheet for Patients:  PinkCheek.be Fact Sheet for Healthcare Providers:  GravelBags.it This test is not yet ap proved or cleared by the [place] FDA and  has been authorized for detection and/or diagnosis of SARS-CoV-2 by FDA under an Emergency Use Authorization (EUA). This EUA will remain  in effect (meaning this test can be used) for the duration of the COVID-19 declaration under Section 564(b)(1) of the Act, 21 U.S.C. section 360bbb-3(b)(1), unless the authorization is terminated or revoked sooner.    Influenza A by PCR NEGATIVE NEGATIVE Final   Influenza B by PCR NEGATIVE NEGATIVE Final    Comment: (NOTE) The Xpert Xpress SARS-CoV-2/FLU/RSV assay is intended as an aid in  the diagnosis of influenza from Nasopharyngeal swab specimens and  should not be used as a sole basis for treatment. Nasal washings and  aspirates are unacceptable for Xpert Xpress SARS-CoV-2/FLU/RSV  testing. Fact Sheet for  Patients: PinkCheek.be Fact Sheet for Healthcare Providers: GravelBags.it This test is not yet approved or cleared by the [place] FDA and  has been authorized for detection and/or diagnosis of SARS-CoV-2 by  FDA under an Emergency Use Authorization (EUA). This EUA will remain  in effect (meaning this test can be used) for the duration of the  Covid-19 declaration under Section 564(b)(1) of the Act, 21  U.S.C. section 360bbb-3(b)(1), unless the authorization is  terminated or revoked. Performed at [facility], [address], [place], [place] [address]    Time coordinating discharge: 31 mins  SIGNED:  Irwin Brakeman, MD  [facility] 11/17/2019, 7:35 AM How to contact the [facility] Attending or Consulting provider [place] or covering provider during after hours [place], for this patient?  1. Check the care team in [facility] and look for a) attending/consulting [facility] provider listed and b) the [facility] team listed 2. Log into [email or web address] and use [place]'s universal password to access. If you do not have the password, please contact the hospital operator. 3. Locate the [facility] provider you are looking for under [facility] and page to a number  that you can be directly reached. 4. If you still have difficulty reaching the provider, please page the [facility] (Director on Call) for the Hospitalists listed on amion for assistance.

## 2019-11-17 NOTE — TOC Transition Note (Signed)
Transition of Care [facility]) - CM/SW Discharge Note   Patient Details  Name: Shane Chambers MRN: [id number] Date of Birth: 1961-09-16  Transition of Care [facility]) CM/SW Contact:  Boneta Lucks, RN Phone Number: 11/17/2019, 10:39 AM   Clinical Narrative:   Patient admitted with bronchitis with bronchospasm, discharge home today. PT is recommending a heavy duty wheelchair.  MD ordered, Emilio Aspen with [employer], no heavy duty wc in stock here, they will delivery. RN updated, patient ready to go home.     Barriers to Discharge: Barriers Resolved   Patient Goals and CMS Choice Patient states their goals for this hospitalization and ongoing recovery are:: to go home. CMS Medicare.gov Compare Post Acute Care list provided to:: Patient Choice offered to / list presented to : Patient  Discharge Plan and Services                DME Arranged: High strength lightweight manual wheelchair with seat cushion DME Agency: [employer] Date DME Agency Contacted: 11/17/19 Time DME Agency Contacted: [phone] Representative spoke with at DME Agency: Blake Divine

## 2019-11-17 NOTE — Discharge Instructions (Signed)
Shortness of Breath, Adult Shortness of breath means you have trouble breathing. Shortness of breath could be a sign of a medical problem. Follow these instructions at home:   Watch for any changes in your symptoms.  Do not use any products that contain nicotine or tobacco, such as cigarettes, e-cigarettes, and chewing tobacco.  Do not smoke. Smoking can cause shortness of breath. If you need help to quit smoking, ask your doctor.  Avoid things that can make it harder to breathe, such as: ? Mold. ? Dust. ? Air pollution. ? Chemical smells. ? Things that can cause allergy symptoms (allergens), if you have allergies.  Keep your living space clean. Use products that help remove mold and dust.  Rest as needed. Slowly return to your normal activities.  Take over-the-counter and prescription medicines only as told by your doctor. This includes oxygen therapy and inhaled medicines.  Keep all follow-up visits as told by your doctor. This is important. Contact a doctor if:  Your condition does not get better as soon as expected.  You have a hard time doing your normal activities, even after you rest.  You have new symptoms. Get help right away if:  Your shortness of breath gets worse.  You have trouble breathing when you are resting.  You feel light-headed or you pass out (faint).  You have a cough that is not helped by medicines.  You cough up blood.  You have pain with breathing.  You have pain in your chest, arms, shoulders, or belly (abdomen).  You have a fever.  You cannot walk up stairs.  You cannot exercise the way you normally do. These symptoms may represent a serious problem that is an emergency. Do not wait to see if the symptoms will go away. Get medical help right away. Call your local emergency services (911 in the [place] Do not drive yourself to the hospital. Summary  Shortness of breath is when you have trouble breathing enough air. It can be a sign of a  medical problem.  Avoid things that make it hard for you to breathe, such as smoking, pollution, mold, and dust.  Watch for any changes in your symptoms. Contact your doctor if you do not get better or you get worse. This information is not intended to replace advice given to you by your health care provider. Make sure you discuss any questions you have with your health care provider. Document Revised: 03/29/2018 Document Reviewed: 03/29/2018 Elsevier Patient Education  [place].  Acute Bronchitis, Adult  Acute bronchitis is when air tubes in the lungs (bronchi) suddenly get swollen. The condition can make it hard for you to breathe. In adults, acute bronchitis usually goes away within 2 weeks. A cough caused by bronchitis may last up to 3 weeks. Smoking, allergies, and asthma can make the condition worse. What are the causes? This condition is caused by:  Cold and flu viruses. The most common cause of this condition is the virus that causes the common cold.  Bacteria.  Substances that irritate the lungs, including: ? Smoke from cigarettes and other types of tobacco. ? Dust and pollen. ? Fumes from chemicals, gases, or burned fuel. ? Other materials that pollute indoor or outdoor air.  Close contact with someone who has acute bronchitis. What increases the risk? The following factors may make you more likely to develop this condition:  A weak body's defense system. This is also called the immune system.  Any condition that affects your  lungs and breathing, such as asthma. What are the signs or symptoms? Symptoms of this condition include:  A cough.  Coughing up clear, yellow, or green mucus.  Wheezing.  Chest congestion.  Shortness of breath.  A fever.  Body aches.  Chills.  A sore throat. How is this treated? Acute bronchitis may go away over time without treatment. Your doctor may recommend:  Drinking more fluids.  Taking a medicine for a fever or  cough.  Using a device that gets medicine into your lungs (inhaler).  Using a vaporizer or a humidifier. These are machines that add water or moisture in the air to help with coughing and poor breathing. Follow these instructions at home:  Activity  Get a lot of rest.  Avoid places where there are fumes from chemicals.  Return to your normal activities as told by your doctor. Ask your doctor what activities are safe for you. Lifestyle  Drink enough fluids to keep your pee (urine) pale yellow.  Do not drink alcohol.  Do not use any products that contain nicotine or tobacco, such as cigarettes, e-cigarettes, and chewing tobacco. If you need help quitting, ask your doctor. Be aware that: ? Your bronchitis will get worse if you smoke or breathe in other people's smoke (secondhand smoke). ? Your lungs will heal faster if you quit smoking. General instructions  Take over-the-counter and prescription medicines only as told by your doctor.  Use an inhaler, cool mist vaporizer, or humidifier as told by your doctor.  Rinse your mouth often with salt water. To make salt water, dissolve -1 tsp (3-6 g) of salt in 1 cup (237 mL) of warm water.  Keep all follow-up visits as told by your doctor. This is important. How is this prevented? To lower your risk of getting this condition again:  Wash your hands often with soap and water. If soap and water are not available, use hand sanitizer.  Avoid contact with people who have cold symptoms.  Try not to touch your mouth, nose, or eyes with your hands.  Make sure to get the flu shot every year. Contact a doctor if:  Your symptoms do not get better in 2 weeks.  You vomit more than once or twice.  You have symptoms of loss of fluid from your body (dehydration). These include: ? Dark urine. ? Dry skin or eyes. ? Increased thirst. ? Headaches. ? Confusion. ? Muscle cramps. Get help right away if:  You cough up blood.  You have chest  pain.  You have very bad shortness of breath.  You become dehydrated.  You faint or keep feeling like you are going to faint.  You keep vomiting.  You have a very bad headache.  Your fever or chills get worse. These symptoms may be an emergency. Do not wait to see if the symptoms will go away. Get medical help right away. Call your local emergency services (911 in the [place] Do not drive yourself to the hospital. Summary  Acute bronchitis is when air tubes in the lungs (bronchi) suddenly get swollen. In adults, acute bronchitis usually goes away within 2 weeks.  Take over-the-counter and prescription medicines only as told by your doctor.  Drink enough fluid to keep your pee (urine) pale yellow.  Contact a doctor if your symptoms do not improve after 2 weeks of treatment.  Get help right away if you cough up blood, faint, or have chest pain or shortness of breath. This information is not intended  to replace advice given to you by your health care provider. Make sure you discuss any questions you have with your health care provider. Document Revised: 05/20/2019 Document Reviewed: 05/20/2019 Elsevier Patient Education  [place].   IMPORTANT INFORMATION: PAY CLOSE ATTENTION   PHYSICIAN DISCHARGE INSTRUCTIONS  Follow with Primary care provider  Patient, No Pcp Per  and other consultants as instructed by your Hospitalist Physician  [place] IF SYMPTOMS COME BACK, WORSEN OR NEW PROBLEM DEVELOPS   Please note: You were cared for by a hospitalist during your hospital stay. Every effort will be made to forward records to your primary care provider.  You can request that your primary care provider send for your hospital records if they have not received them.  Once you are discharged, your primary care physician will handle any further medical issues. Please note that NO REFILLS for any discharge medications will be authorized once you are  discharged, as it is imperative that you return to your primary care physician (or establish a relationship with a primary care physician if you do not have one) for your post hospital discharge needs so that they can reassess your need for medications and monitor your lab values.  Please get a complete blood count and chemistry panel checked by your Primary MD at your next visit, and again as instructed by your Primary MD.  Get Medicines reviewed and adjusted: Please take all your medications with you for your next visit with your Primary MD  Laboratory/radiological data: Please request your Primary MD to go over all hospital tests and procedure/radiological results at the follow up, please ask your primary care provider to get all Hospital records sent to his/her office.  In some cases, they will be blood work, cultures and biopsy results pending at the time of your discharge. Please request that your primary care provider follow up on these results.  If you are diabetic, please bring your blood sugar readings with you to your follow up appointment with primary care.    Please call and make your follow up appointments as soon as possible.    Also Note the following: If you experience worsening of your admission symptoms, develop shortness of breath, life threatening emergency, suicidal or homicidal thoughts you must seek medical attention immediately by calling 911 or calling your MD immediately  if symptoms less severe.  You must read complete instructions/literature along with all the possible adverse reactions/side effects for all the Medicines you take and that have been prescribed to you. Take any new Medicines after you have completely understood and accpet all the possible adverse reactions/side effects.   Do not drive when taking Pain medications or sleeping medications (Benzodiazepines)  Do not take more than prescribed Pain, Sleep and Anxiety Medications. It is not advisable to  combine anxiety,sleep and pain medications without talking with your primary care practitioner  Special Instructions: If you have smoked or chewed Tobacco  in the last 2 yrs please stop smoking, stop any regular Alcohol  and or any Recreational drug use.  Wear Seat belts while driving.  Do not drive if taking any narcotic, mind altering or controlled substances or recreational drugs or alcohol.

## 2019-11-17 NOTE — Evaluation (Signed)
Physical Therapy Evaluation Patient Details Name: Shane Chambers MRN: [id number] DOB: 12-31-1960 Today's Date: 11/17/2019   History of Present Illness  Shane Chambers is a 59 y.o. male with medical history significant for chronic right hemidiaphragm, hypertension, type 2 diabetes mellitus, who is admitted to [facility] on 11/15/2019 with suspected acute bronchitis with bronchospasm after presenting from home to [facility] complaining of shortness of breath.    Clinical Impression  Patient functioning near baseline for functional mobility and gait, had difficulty attempting sit to stands using SPC due to BLE weakness, required use of RW for safety, able to ambulate outside of room and back to bedside without loss of balance and tolerated sitting up at bedside after therapy.  Plan:  Patient to be discharged to home today and discharged from physical therapy to care of nursing for ambulation daily as tolerated for length of stayl  Wheelchair recommendation:  Patient suffers from chronic right hemidiaphragm, hypertension, type 2 diabetes mellitus which impairs his ability to perform daily activities like walking, prolonged standing and completing ADLs in the home.  A walker alone will not resolve the issues with performing activities of daily living. A wheelchair will allow patient to safely perform daily activities.  The patient can self propel in the home and has a caregiver who can provide assistance.        Follow Up Recommendations Home health PT    Equipment Recommendations  Wheelchair (measurements PT);Wheelchair cushion (measurements PT)    Recommendations for Other Services       Precautions / Restrictions Precautions Precautions: Fall Restrictions Weight Bearing Restrictions: No      Mobility  Bed Mobility Overal bed mobility: Modified Independent             General bed mobility comments: head of bed raised  Transfers Overall transfer  level: Needs assistance Equipment used: Rolling walker (2 wheeled) Transfers: Sit to/from [employer] Sit to Stand: Supervision Stand pivot transfers: Supervision;Min guard       General transfer comment: unable to complete sit to stand using SPC due to generalized weakness, had to use RW  Ambulation/Gait Ambulation/Gait assistance: Supervision;Min guard Gait Distance (Feet): 25 Feet Assistive device: Rolling walker (2 wheeled) Gait Pattern/deviations: Decreased step length - right;Decreased step length - left;Decreased stride length;Wide base of support Gait velocity: decreased   General Gait Details: slow labored unsteady cadence with wide base of support without loss of balance, limited secondary to c/o fatigue  Stairs            Wheelchair Mobility    Modified Rankin (Stroke Patients Only)       Balance Overall balance assessment: Needs assistance Sitting-balance support: Feet supported;No upper extremity supported Sitting balance-Leahy Scale: Fair Sitting balance - Comments: fair/good seated at bedside   Standing balance support: During functional activity;Single extremity supported Standing balance-Leahy Scale: Poor Standing balance comment: fair using RW                             Pertinent Vitals/Pain Pain Assessment: No/denies pain    Home Living Family/patient expects to be discharged to:: Private residence Living Arrangements: Parent;Other relatives Available Help at Discharge: Family;Available PRN/intermittently Type of Home: Mobile home Home Access: Stairs to enter Entrance Stairs-Rails: None(present side rail is loose and unreliable to lean on per patient) Entrance Stairs-Number of Steps: 5-6 Home Layout: One level Home Equipment: Walker - standard;Bedside commode;Cane - single point  Prior Function Level of Independence: Needs assistance   Gait / Transfers Assistance Needed: household ambulator with SPC PRN,  uses electric scooters where available when shopping  ADL's / Homemaking Assistance Needed: assisted by family        Hand Dominance        Extremity/Trunk Assessment   Upper Extremity Assessment Upper Extremity Assessment: Overall WFL for tasks assessed    Lower Extremity Assessment Lower Extremity Assessment: Generalized weakness    Cervical / Trunk Assessment Cervical / Trunk Assessment: Kyphotic  Communication   Communication: No difficulties  Cognition Arousal/Alertness: Awake/alert Behavior During Therapy: WFL for tasks assessed/performed Overall Cognitive Status: Within Functional Limits for tasks assessed                                        General Comments      Exercises     Assessment/Plan    PT Assessment All further PT needs can be met in the next venue of care  PT Problem List Decreased strength;Decreased activity tolerance;Decreased balance;Decreased mobility       PT Treatment Interventions      PT Goals (Current goals can be found in the Care Plan section)  Acute Rehab PT Goals Patient Stated Goal: return home with family to assist PT Goal Formulation: With patient Time For Goal Achievement: 11/17/19 Potential to Achieve Goals: Good    Frequency     Barriers to discharge        Co-evaluation               AM-PAC PT "6 Clicks" Mobility  Outcome Measure Help needed turning from your back to your side while in a flat bed without using bedrails?: None Help needed moving from lying on your back to sitting on the side of a flat bed without using bedrails?: None Help needed moving to and from a bed to a chair (including a wheelchair)?: A Little Help needed standing up from a chair using your arms (e.g., wheelchair or bedside chair)?: A Little Help needed to walk in hospital room?: A Little Help needed climbing 3-5 steps with a railing? : A Lot 6 Click Score: 19    End of Session   Activity Tolerance: Patient  tolerated treatment well;Patient limited by fatigue Patient left: in bed;with call bell/phone within reach(seated at bedside) Nurse Communication: Mobility status PT Visit Diagnosis: Unsteadiness on feet (R26.81);Other abnormalities of gait and mobility (R26.89);Muscle weakness (generalized) (M62.81)    Time: 5449-2010 PT Time Calculation (min) (ACUTE ONLY): 26 min   Charges:   PT Evaluation $PT Eval Moderate Complexity: 1 Mod PT Treatments $Therapeutic Activity: 23-37 mins        8:59 AM, 11/17/19 Lonell Grandchild, MPT Physical Therapist with [facility] [phone] office [phone] mobile phone

## 2019-12-01 ENCOUNTER — Inpatient Hospital Stay (HOSPITAL_COMMUNITY)
Admission: EM | Admit: 2019-12-01 | Discharge: 2019-12-06 | DRG: 177 | Disposition: A | Discharge status: Home / Self Care | Payer: Medicaid Other | Attending: Family Medicine | Admitting: Family Medicine

## 2019-12-01 ENCOUNTER — Encounter (HOSPITAL_COMMUNITY): Payer: Self-pay | Admitting: Emergency Medicine

## 2019-12-01 ENCOUNTER — Emergency Department (HOSPITAL_COMMUNITY): Payer: Medicaid Other

## 2019-12-01 ENCOUNTER — Other Ambulatory Visit: Payer: Self-pay

## 2019-12-01 DIAGNOSIS — R0602 Shortness of breath: Secondary | ICD-10-CM | POA: Diagnosis present

## 2019-12-01 DIAGNOSIS — F329 Major depressive disorder, single episode, unspecified: Secondary | ICD-10-CM | POA: Diagnosis present

## 2019-12-01 DIAGNOSIS — Z7982 Long term (current) use of aspirin: Secondary | ICD-10-CM

## 2019-12-01 DIAGNOSIS — I10 Essential (primary) hypertension: Secondary | ICD-10-CM

## 2019-12-01 DIAGNOSIS — D649 Anemia, unspecified: Secondary | ICD-10-CM | POA: Diagnosis present

## 2019-12-01 DIAGNOSIS — J1282 Pneumonia due to coronavirus disease 2019: Secondary | ICD-10-CM | POA: Diagnosis present

## 2019-12-01 DIAGNOSIS — J189 Pneumonia, unspecified organism: Secondary | ICD-10-CM

## 2019-12-01 DIAGNOSIS — E876 Hypokalemia: Secondary | ICD-10-CM | POA: Diagnosis present

## 2019-12-01 DIAGNOSIS — E785 Hyperlipidemia, unspecified: Secondary | ICD-10-CM | POA: Diagnosis present

## 2019-12-01 DIAGNOSIS — N400 Enlarged prostate without lower urinary tract symptoms: Secondary | ICD-10-CM | POA: Diagnosis present

## 2019-12-01 DIAGNOSIS — Z8249 Family history of ischemic heart disease and other diseases of the circulatory system: Secondary | ICD-10-CM

## 2019-12-01 DIAGNOSIS — Z888 Allergy status to other drugs, medicaments and biological substances status: Secondary | ICD-10-CM

## 2019-12-01 DIAGNOSIS — E559 Vitamin D deficiency, unspecified: Secondary | ICD-10-CM | POA: Diagnosis present

## 2019-12-01 DIAGNOSIS — Z87891 Personal history of nicotine dependence: Secondary | ICD-10-CM

## 2019-12-01 DIAGNOSIS — Z880 Allergy status to penicillin: Secondary | ICD-10-CM

## 2019-12-01 DIAGNOSIS — R0902 Hypoxemia: Secondary | ICD-10-CM

## 2019-12-01 DIAGNOSIS — R2681 Unsteadiness on feet: Secondary | ICD-10-CM

## 2019-12-01 DIAGNOSIS — U071 COVID-19: Secondary | ICD-10-CM | POA: Diagnosis present

## 2019-12-01 DIAGNOSIS — E1165 Type 2 diabetes mellitus with hyperglycemia: Secondary | ICD-10-CM | POA: Diagnosis not present

## 2019-12-01 DIAGNOSIS — R531 Weakness: Secondary | ICD-10-CM

## 2019-12-01 DIAGNOSIS — Z91048 Other nonmedicinal substance allergy status: Secondary | ICD-10-CM

## 2019-12-01 DIAGNOSIS — E11649 Type 2 diabetes mellitus with hypoglycemia without coma: Secondary | ICD-10-CM

## 2019-12-01 DIAGNOSIS — Y92239 Unspecified place in hospital as the place of occurrence of the external cause: Secondary | ICD-10-CM | POA: Diagnosis not present

## 2019-12-01 DIAGNOSIS — Z79899 Other long term (current) drug therapy: Secondary | ICD-10-CM | POA: Diagnosis not present

## 2019-12-01 DIAGNOSIS — Z794 Long term (current) use of insulin: Secondary | ICD-10-CM | POA: Diagnosis not present

## 2019-12-01 DIAGNOSIS — E782 Mixed hyperlipidemia: Secondary | ICD-10-CM | POA: Diagnosis present

## 2019-12-01 DIAGNOSIS — T380X5A Adverse effect of glucocorticoids and synthetic analogues, initial encounter: Secondary | ICD-10-CM | POA: Diagnosis not present

## 2019-12-01 DIAGNOSIS — E119 Type 2 diabetes mellitus without complications: Secondary | ICD-10-CM

## 2019-12-01 DIAGNOSIS — J9611 Chronic respiratory failure with hypoxia: Secondary | ICD-10-CM | POA: Insufficient documentation

## 2019-12-01 DIAGNOSIS — J9801 Acute bronchospasm: Secondary | ICD-10-CM

## 2019-12-01 HISTORY — DX: Hyperlipidemia, unspecified: E78.5

## 2019-12-01 HISTORY — DX: Type 2 diabetes mellitus without complications: E11.9

## 2019-12-01 LAB — CBG MONITORING, ED
Glucose-Capillary: 320 mg/dL — ABNORMAL HIGH (ref 70–99)
Glucose-Capillary: 366 mg/dL — ABNORMAL HIGH (ref 70–99)

## 2019-12-01 LAB — CBC WITH DIFFERENTIAL/PLATELET
Abs Immature Granulocytes: 0.16 10*3/uL — ABNORMAL HIGH (ref 0.00–0.07)
Basophils Absolute: 0 10*3/uL (ref 0.0–0.1)
Basophils Relative: 0 %
Eosinophils Absolute: 0.1 10*3/uL (ref 0.0–0.5)
Eosinophils Relative: 1 %
HCT: 37.6 % — ABNORMAL LOW (ref 39.0–52.0)
Hemoglobin: 12 g/dL — ABNORMAL LOW (ref 13.0–17.0)
Immature Granulocytes: 2 %
Lymphocytes Relative: 12 %
Lymphs Abs: 1.2 10*3/uL (ref 0.7–4.0)
MCH: 27.8 pg (ref 26.0–34.0)
MCHC: 31.9 g/dL (ref 30.0–36.0)
MCV: 87 fL (ref 80.0–100.0)
Monocytes Absolute: 0.9 10*3/uL (ref 0.1–1.0)
Monocytes Relative: 9 %
Neutro Abs: 7.7 10*3/uL (ref 1.7–7.7)
Neutrophils Relative %: 76 %
Platelets: 206 10*3/uL (ref 150–400)
RBC: 4.32 MIL/uL (ref 4.22–5.81)
RDW: 13 % (ref 11.5–15.5)
WBC: 10 10*3/uL (ref 4.0–10.5)
nRBC: 0 % (ref 0.0–0.2)

## 2019-12-01 LAB — IRON AND TIBC
Iron: 18 ug/dL — ABNORMAL LOW (ref 45–182)
Saturation Ratios: 5 % — ABNORMAL LOW (ref 17.9–39.5)
TIBC: 376 ug/dL (ref 250–450)
UIBC: 358 ug/dL

## 2019-12-01 LAB — COMPREHENSIVE METABOLIC PANEL
ALT: 22 U/L (ref 0–44)
AST: 25 U/L (ref 15–41)
Albumin: 3.1 g/dL — ABNORMAL LOW (ref 3.5–5.0)
Alkaline Phosphatase: 120 U/L (ref 38–126)
Anion gap: 10 (ref 5–15)
BUN: 15 mg/dL (ref 6–20)
CO2: 30 mmol/L (ref 22–32)
Calcium: 8.1 mg/dL — ABNORMAL LOW (ref 8.9–10.3)
Chloride: 91 mmol/L — ABNORMAL LOW (ref 98–111)
Creatinine, Ser: 0.87 mg/dL (ref 0.61–1.24)
GFR calc Af Amer: >60 mL/min (ref >60)
GFR calc non Af Amer: >60 mL/min (ref >60)
Glucose, Bld: 126 mg/dL — ABNORMAL HIGH (ref 70–99)
Potassium: 3.4 mmol/L — ABNORMAL LOW (ref 3.5–5.1)
Sodium: 131 mmol/L — ABNORMAL LOW (ref 135–145)
Total Bilirubin: 0.7 mg/dL (ref 0.3–1.2)
Total Protein: 6.4 g/dL — ABNORMAL LOW (ref 6.5–8.1)

## 2019-12-01 LAB — FERRITIN
Ferritin: 45 ng/mL (ref 24–336)
Ferritin: 85 ng/mL (ref 24–336)

## 2019-12-01 LAB — BRAIN NATRIURETIC PEPTIDE
B Natriuretic Peptide: 93 pg/mL (ref 0.0–100.0)
B Natriuretic Peptide: 167 pg/mL — ABNORMAL HIGH (ref 0.0–100.0)

## 2019-12-01 LAB — LACTIC ACID, PLASMA: Lactic Acid, Venous: 1.4 mmol/L (ref 0.5–1.9)

## 2019-12-01 LAB — TROPONIN I (HIGH SENSITIVITY)
Troponin I (High Sensitivity): 64 ng/L — ABNORMAL HIGH (ref <18)
Troponin I (High Sensitivity): 55 ng/L — ABNORMAL HIGH (ref <18)

## 2019-12-01 LAB — RETICULOCYTES
Immature Retic Fract: 13 % (ref 2.3–15.9)
RBC.: 4.3 MIL/uL (ref 4.22–5.81)
Retic Count, Absolute: 58.1 10*3/uL (ref 19.0–186.0)
Retic Ct Pct: 1.4 % (ref 0.4–3.1)

## 2019-12-01 LAB — FOLATE: Folate: 29.6 ng/mL (ref >5.9)

## 2019-12-01 LAB — GLUCOSE, CAPILLARY: Glucose-Capillary: 331 mg/dL — ABNORMAL HIGH (ref 70–99)

## 2019-12-01 LAB — C-REACTIVE PROTEIN: CRP: 10.7 mg/dL — ABNORMAL HIGH (ref <1.0)

## 2019-12-01 LAB — SARS CORONAVIRUS 2 (TAT 6-24 HRS): SARS Coronavirus 2: POSITIVE — AB

## 2019-12-01 LAB — FIBRINOGEN: Fibrinogen: 728 mg/dL — ABNORMAL HIGH (ref 210–475)

## 2019-12-01 LAB — VITAMIN B12: Vitamin B-12: 455 pg/mL (ref 180–914)

## 2019-12-01 LAB — D-DIMER, QUANTITATIVE: D-Dimer, Quant: 0.59 ug/mL-FEU — ABNORMAL HIGH (ref 0.00–0.50)

## 2019-12-01 LAB — VITAMIN D 25 HYDROXY (VIT D DEFICIENCY, FRACTURES): Vit D, 25-Hydroxy: 8.61 ng/mL — ABNORMAL LOW (ref 30–100)

## 2019-12-01 LAB — ABO/RH: ABO/RH(D): A POS

## 2019-12-01 LAB — LACTATE DEHYDROGENASE: LDH: 242 U/L — ABNORMAL HIGH (ref 98–192)

## 2019-12-01 LAB — PROCALCITONIN: Procalcitonin: 0.11 ng/mL

## 2019-12-01 MED ORDER — IPRATROPIUM-ALBUTEROL 20-100 MCG/ACT IN AERS
1.0000 | INHALATION_SPRAY | Freq: Four times a day (QID) | RESPIRATORY_TRACT | Status: DC
  Administered 2019-12-01 – 2019-12-02 (×4): 1 via RESPIRATORY_TRACT
  Filled 2019-12-01: qty 4

## 2019-12-01 MED ORDER — INSULIN ASPART 100 UNIT/ML ~~LOC~~ SOLN
0.0000 [IU] | Freq: Three times a day (TID) | SUBCUTANEOUS | Status: DC
  Administered 2019-12-01: 20 [IU] via SUBCUTANEOUS
  Administered 2019-12-01: 15 [IU] via SUBCUTANEOUS
  Administered 2019-12-02: 3 [IU] via SUBCUTANEOUS
  Administered 2019-12-02: 13:00:00 4 [IU] via SUBCUTANEOUS
  Administered 2019-12-02: 7 [IU] via SUBCUTANEOUS
  Administered 2019-12-03: 15 [IU] via SUBCUTANEOUS
  Administered 2019-12-03: 4 [IU] via SUBCUTANEOUS
  Administered 2019-12-03: 11 [IU] via SUBCUTANEOUS
  Administered 2019-12-04: 7 [IU] via SUBCUTANEOUS
  Administered 2019-12-04: 4 [IU] via SUBCUTANEOUS
  Administered 2019-12-04: 7 [IU] via SUBCUTANEOUS
  Administered 2019-12-05: 4 [IU] via SUBCUTANEOUS
  Administered 2019-12-05: 7 [IU] via SUBCUTANEOUS
  Administered 2019-12-05: 3 [IU] via SUBCUTANEOUS
  Administered 2019-12-06: 7 [IU] via SUBCUTANEOUS
  Filled 2019-12-01 (×2): qty 1

## 2019-12-01 MED ORDER — ALBUTEROL SULFATE HFA 108 (90 BASE) MCG/ACT IN AERS
8.0000 | INHALATION_SPRAY | Freq: Once | RESPIRATORY_TRACT | Status: AC
  Administered 2019-12-01: 8 via RESPIRATORY_TRACT
  Filled 2019-12-01: qty 6.7

## 2019-12-01 MED ORDER — GUAIFENESIN-DM 100-10 MG/5ML PO SYRP
10.0000 mL | ORAL_SOLUTION | ORAL | Status: DC | PRN
  Administered 2019-12-02: 06:00:00 10 mL via ORAL
  Filled 2019-12-01: qty 10

## 2019-12-01 MED ORDER — IPRATROPIUM BROMIDE HFA 17 MCG/ACT IN AERS: 2.0000 | INHALATION_SPRAY | Freq: Once | RESPIRATORY_TRACT | Status: DC

## 2019-12-01 MED ORDER — VENLAFAXINE HCL ER 75 MG PO CP24
150.0000 mg | ORAL_CAPSULE | Freq: Every day | ORAL | Status: DC
  Administered 2019-12-02 – 2019-12-06 (×5): 150 mg via ORAL
  Filled 2019-12-01 (×5): qty 2

## 2019-12-01 MED ORDER — TAMSULOSIN HCL 0.4 MG PO CAPS
0.4000 mg | ORAL_CAPSULE | Freq: Every day | ORAL | Status: DC
  Administered 2019-12-01 – 2019-12-06 (×6): 0.4 mg via ORAL
  Filled 2019-12-01 (×6): qty 1

## 2019-12-01 MED ORDER — SODIUM CHLORIDE 0.9 % IV SOLN
200.0000 mg | Freq: Once | INTRAVENOUS | Status: AC
  Administered 2019-12-01: 200 mg via INTRAVENOUS
  Filled 2019-12-01: qty 40

## 2019-12-01 MED ORDER — ALBUTEROL SULFATE HFA 108 (90 BASE) MCG/ACT IN AERS
8.0000 | INHALATION_SPRAY | Freq: Once | RESPIRATORY_TRACT | Status: AC
  Administered 2019-12-01: 05:00:00 8 via RESPIRATORY_TRACT

## 2019-12-01 MED ORDER — AMLODIPINE BESYLATE 5 MG PO TABS
10.0000 mg | ORAL_TABLET | Freq: Every day | ORAL | Status: DC
  Administered 2019-12-01 – 2019-12-06 (×6): 10 mg via ORAL
  Filled 2019-12-01 (×6): qty 2

## 2019-12-01 MED ORDER — ENOXAPARIN SODIUM 80 MG/0.8ML ~~LOC~~ SOLN
65.0000 mg | SUBCUTANEOUS | Status: DC
  Administered 2019-12-01 – 2019-12-06 (×6): 65 mg via SUBCUTANEOUS
  Filled 2019-12-01 (×6): qty 0.8

## 2019-12-01 MED ORDER — ACETAMINOPHEN 325 MG PO TABS
650.0000 mg | ORAL_TABLET | Freq: Four times a day (QID) | ORAL | Status: DC | PRN
  Administered 2019-12-03 – 2019-12-05 (×5): 650 mg via ORAL
  Filled 2019-12-01 (×4): qty 2

## 2019-12-01 MED ORDER — METFORMIN HCL 500 MG PO TABS: 1000.0000 mg | ORAL_TABLET | Freq: Two times a day (BID) | ORAL | Status: DC

## 2019-12-01 MED ORDER — PANTOPRAZOLE SODIUM 40 MG PO TBEC
40.0000 mg | DELAYED_RELEASE_TABLET | Freq: Every day | ORAL | Status: DC
  Administered 2019-12-01 – 2019-12-06 (×6): 40 mg via ORAL
  Filled 2019-12-01 (×6): qty 1

## 2019-12-01 MED ORDER — GABAPENTIN 300 MG PO CAPS
900.0000 mg | ORAL_CAPSULE | Freq: Two times a day (BID) | ORAL | Status: DC
  Administered 2019-12-02 – 2019-12-06 (×10): 900 mg via ORAL
  Filled 2019-12-01 (×11): qty 3

## 2019-12-01 MED ORDER — ALBUTEROL SULFATE (2.5 MG/3ML) 0.083% IN NEBU: 5.0000 mg | INHALATION_SOLUTION | Freq: Once | RESPIRATORY_TRACT | Status: DC

## 2019-12-01 MED ORDER — METOPROLOL SUCCINATE ER 50 MG PO TB24
200.0000 mg | ORAL_TABLET | Freq: Every day | ORAL | Status: DC
  Administered 2019-12-02 – 2019-12-06 (×5): 200 mg via ORAL
  Filled 2019-12-01 (×5): qty 4

## 2019-12-01 MED ORDER — ASCORBIC ACID 500 MG PO TABS
500.0000 mg | ORAL_TABLET | Freq: Every day | ORAL | Status: DC
  Administered 2019-12-01 – 2019-12-06 (×6): 500 mg via ORAL
  Filled 2019-12-01 (×6): qty 1

## 2019-12-01 MED ORDER — LEVOFLOXACIN IN D5W 750 MG/150ML IV SOLN
750.0000 mg | Freq: Once | INTRAVENOUS | Status: AC
  Administered 2019-12-01: 750 mg via INTRAVENOUS
  Filled 2019-12-01: qty 150

## 2019-12-01 MED ORDER — INSULIN ASPART PROT & ASPART (70-30 MIX) 100 UNIT/ML ~~LOC~~ SUSP
50.0000 [IU] | Freq: Two times a day (BID) | SUBCUTANEOUS | Status: DC
  Filled 2019-12-01: qty 10

## 2019-12-01 MED ORDER — INSULIN ASPART PROT & ASPART (70-30 MIX) 100 UNIT/ML ~~LOC~~ SUSP: 65.0000 [IU] | Freq: Two times a day (BID) | SUBCUTANEOUS | Status: DC

## 2019-12-01 MED ORDER — ONDANSETRON HCL 4 MG/2ML IJ SOLN
4.0000 mg | Freq: Four times a day (QID) | INTRAMUSCULAR | Status: DC | PRN
  Administered 2019-12-02: 4 mg via INTRAVENOUS
  Filled 2019-12-01: qty 2

## 2019-12-01 MED ORDER — ZINC SULFATE 220 (50 ZN) MG PO CAPS
220.0000 mg | ORAL_CAPSULE | Freq: Every day | ORAL | Status: DC
  Administered 2019-12-01 – 2019-12-06 (×6): 220 mg via ORAL
  Filled 2019-12-01 (×6): qty 1

## 2019-12-01 MED ORDER — POTASSIUM CHLORIDE CRYS ER 20 MEQ PO TBCR
20.0000 meq | EXTENDED_RELEASE_TABLET | Freq: Once | ORAL | Status: AC
  Administered 2019-12-01: 20 meq via ORAL
  Filled 2019-12-01: qty 1

## 2019-12-01 MED ORDER — CHLORTHALIDONE 25 MG PO TABS: 25.0000 mg | ORAL_TABLET | Freq: Every day | ORAL | Status: DC

## 2019-12-01 MED ORDER — MAGNESIUM SULFATE 2 GM/50ML IV SOLN
2.0000 g | Freq: Once | INTRAVENOUS | Status: AC
  Administered 2019-12-01: 2 g via INTRAVENOUS
  Filled 2019-12-01: qty 50

## 2019-12-01 MED ORDER — ACETAMINOPHEN 650 MG RE SUPP: 650.0000 mg | Freq: Four times a day (QID) | RECTAL | Status: DC | PRN

## 2019-12-01 MED ORDER — ASPIRIN EC 81 MG PO TBEC
81.0000 mg | DELAYED_RELEASE_TABLET | Freq: Every day | ORAL | Status: DC
  Administered 2019-12-01 – 2019-12-06 (×6): 81 mg via ORAL
  Filled 2019-12-01 (×6): qty 1

## 2019-12-01 MED ORDER — ALBUTEROL SULFATE HFA 108 (90 BASE) MCG/ACT IN AERS
1.0000 | INHALATION_SPRAY | Freq: Four times a day (QID) | RESPIRATORY_TRACT | Status: DC | PRN
  Filled 2019-12-01: qty 6.7

## 2019-12-01 MED ORDER — FLUTICASONE FUROATE 27.5 MCG/SPRAY NA SUSP: 2.0000 | Freq: Every day | NASAL | Status: DC | PRN

## 2019-12-01 MED ORDER — SODIUM CHLORIDE 0.9 % IV SOLN
100.0000 mg | Freq: Every day | INTRAVENOUS | Status: AC
  Administered 2019-12-02 – 2019-12-05 (×4): 100 mg via INTRAVENOUS
  Filled 2019-12-01 (×5): qty 20

## 2019-12-01 MED ORDER — LEVOFLOXACIN IN D5W 750 MG/150ML IV SOLN: 750.0000 mg | INTRAVENOUS | Status: DC

## 2019-12-01 MED ORDER — IPRATROPIUM BROMIDE HFA 17 MCG/ACT IN AERS
2.0000 | INHALATION_SPRAY | Freq: Once | RESPIRATORY_TRACT | Status: DC
  Filled 2019-12-01: qty 12.9

## 2019-12-01 MED ORDER — ATORVASTATIN CALCIUM 40 MG PO TABS
40.0000 mg | ORAL_TABLET | Freq: Every day | ORAL | Status: DC
  Administered 2019-12-01 – 2019-12-05 (×5): 40 mg via ORAL
  Filled 2019-12-01 (×5): qty 1

## 2019-12-01 MED ORDER — METHYLPREDNISOLONE SODIUM SUCC 125 MG IJ SOLR
125.0000 mg | Freq: Once | INTRAMUSCULAR | Status: AC
  Administered 2019-12-01: 125 mg via INTRAVENOUS
  Filled 2019-12-01: qty 2

## 2019-12-01 MED ORDER — INSULIN ASPART PROT & ASPART (70-30 MIX) 100 UNIT/ML ~~LOC~~ SUSP: 30.0000 [IU] | Freq: Two times a day (BID) | SUBCUTANEOUS | Status: DC

## 2019-12-01 MED ORDER — INSULIN ASPART PROT & ASPART (70-30 MIX) 100 UNIT/ML ~~LOC~~ SUSP
45.0000 [IU] | Freq: Two times a day (BID) | SUBCUTANEOUS | Status: DC
  Administered 2019-12-01: 45 [IU] via SUBCUTANEOUS
  Filled 2019-12-01: qty 10

## 2019-12-01 MED ORDER — ONDANSETRON HCL 4 MG PO TABS
4.0000 mg | ORAL_TABLET | Freq: Four times a day (QID) | ORAL | Status: DC | PRN
  Filled 2019-12-01: qty 1

## 2019-12-01 NOTE — H&P (Signed)
History and Physical    Shane Chambers [id number] DOB: Aug 11, 1961 DOA: 12/01/2019  PCP: [name], MD   Patient coming from: Home.  I have personally briefly reviewed patient's old medical records in [place]  Chief Complaint: Shortness of breath.  HPI: Shane Chambers is a 59 y.o. male with medical history significant of depression, type 2 diabetes, hyperlipidemia, hypertension who was admitted from 11/15/2019 until 11/17/2019 due to dyspnea and now returns with worsening dyspnea, productive cough associated with pleuritic chest pain, sore throat and wheezing.  He denies fever, chills, but feels tired and fatigued.  Denies hemoptysis, palpitations, dizziness, diaphoresis, PND, he occasionally gets lower extremity edema.  No abdominal pain, nausea, emesis, diarrhea, constipation.  No dysuria, frequency or hematuria.  Denies polyuria, polydipsia, polyphagia or blurred vision  ED Course: Initial vital signs temperature 98.2 F, pulse 73, respirations 24, blood pressure 156/70 mmHg and O2 sat 89% on room air.  On 2 LPM via [place] O2 sat is in the mid to high 90s.  He received bronchodilators, Levaquin 750 mg IVPB and 125 mg of Solu-Medrol.  White count was 10.0, hemoglobin 12.0 g/dL and platelets 206.  Lactic acid was normal.  Troponin was 64 then 55 ng/L.  BNP was normal.  Sodium 131, potassium 3.4, chloride 91 and CO2 30 mmol/L.  His glucose 126 and calcium 8.1 mg/dL.  Calcium normalizes when corrected to an albumin of 3.1 g/dL.  His chest radiograph shows minimal patchy bibasilar opacities which are new from previous film.  Review of Systems: As per HPI otherwise 10 point review of systems negative.   Past Medical History:  Diagnosis Date  . Depression   . Diabetes mellitus without complication ([place])   . Hyperlipidemia 12/01/2019  . Hypertension   . Type 2 diabetes mellitus ([place])     Past Surgical History:  Procedure Laterality Date  . DENTAL SURGERY       reports that he  has quit smoking. He has never used smokeless tobacco. He reports current alcohol use. He reports that he does not use drugs.  Allergies  Allergen Reactions  . Ace Inhibitors Swelling and Cough  . Other Itching    [name]  . Penicillins     Has patient had a PCN reaction causing immediate rash, facial/tongue/throat swelling, SOB or lightheadedness with hypotension: Unknown Has patient had a PCN reaction causing severe rash involving mucus membranes or skin necrosis: Unknown Has patient had a PCN reaction that required hospitalization: Unknown Has patient had a PCN reaction occurring within the last 10 years: Unknown If all of the above answers are "NO", then may proceed with Cephalosporin use.    Family history Mother hypertension.  Prior to Admission medications   Medication Sig Start Date End Date Taking? Authorizing Provider  albuterol (VENTOLIN HFA) 108 (90 Base) MCG/ACT inhaler Inhale 1-2 puffs into the lungs every 6 (six) hours as needed for wheezing or shortness of breath.  07/27/19   [provider]  amLODipine (NORVASC) 10 MG tablet Take 10 mg by mouth daily.    [provider]  aspirin 81 MG EC tablet Take 81 mg by mouth daily.  03/10/16   [provider]  atorvastatin (LIPITOR) 40 MG tablet Take 40 mg by mouth daily. 10/20/19   [provider]  chlorthalidone (HYGROTON) 25 MG tablet Take 25 mg by mouth daily.    [provider]  diclofenac Sodium (VOLTAREN) 1 % GEL Apply 2 g topically daily as  needed (for pain).     [provider]  fluticasone (VERAMYST) 27.5 MCG/SPRAY nasal spray Place 2 sprays into the nose daily as needed for rhinitis or allergies.     [provider]  gabapentin (NEURONTIN) 300 MG capsule Take 900 mg by mouth 2 (two) times daily.     [provider]  metFORMIN (GLUCOPHAGE) 1000 MG tablet Take 1,000 mg by mouth 2 (two) times daily with a meal.    [provider]  metoprolol  (TOPROL-XL) 200 MG 24 hr tablet Take 200 mg by mouth daily.    [provider]  Multiple Vitamins-Minerals (TAB-A-VITE) TABS Take 1 tablet by mouth daily. 07/27/19   [provider]  NOVOLOG MIX 70/30 (70-30) 100 UNIT/ML injection Inject 65 Units into the skin 2 (two) times daily with a meal.  10/28/19   [provider]  omeprazole (PRILOSEC) 40 MG capsule Take 40 mg by mouth daily.    [provider]  Pseudoephedrine-APAP-DM (DAYQUIL MULTI-SYMPTOM COLD/FLU PO) Take 1-2 capsules by mouth daily as needed (cold symptoms).    [provider]  tamsulosin (FLOMAX) 0.4 MG CAPS capsule Take 0.4 mg by mouth daily.    [provider]  venlafaxine XR (EFFEXOR-XR) 150 MG 24 hr capsule Take 150 mg by mouth daily with breakfast.    [provider]    Physical Exam: Vitals:   12/01/19 0415 12/01/19 0430 12/01/19 0500 12/01/19 0540  BP:  (!) 146/73 (!) 167/87 118/75  Pulse: 77 78 79 85  Resp: (!) 22 20 19  (!) 22  Temp:      TempSrc:      SpO2: 93% 92% 95% 95%  Weight:      Height:        Constitutional: NAD, calm, comfortable Eyes: PERRL, lids and conjunctivae normal ENMT: Nasal cannula in place.  Mild erythema right ear canal.  Mucous membranes are moist. Posterior pharynx clear of any exudate or lesions. Neck: normal, supple, no masses, no thyromegaly Respiratory: Mildly tachypneic in the low 20s.  Decreased breath sounds with bilateral rhonchi and wheezing. No accessory muscle use.  Cardiovascular: Regular rate and rhythm, no murmurs / rubs / gallops. No extremity edema. 2+ pedal pulses. No carotid bruits.  Abdomen: Nondistended.  Obese.  Bowel sounds positive.  Soft, no tenderness, no masses palpated. No hepatosplenomegaly. Musculoskeletal: no clubbing / cyanosis. Good ROM, no contractures. Normal muscle tone.  Skin: Areas of ecchymosis on abdomen from previous DVT prophylaxis injections. Neurologic: CN 2-12 grossly intact. Sensation  intact, DTR normal. Strength 5/5 in all 4.  Psychiatric: Normal judgment and insight. Alert and oriented x 3. Normal mood.   Labs on Admission: I have personally reviewed following labs and imaging studies  CBC: Recent Labs  Lab 12/01/19 0148  WBC 10.0  NEUTROABS 7.7  HGB 12.0*  HCT 37.6*  MCV 87.0  PLT 99991111   Basic Metabolic Panel: Recent Labs  Lab 12/01/19 0148  NA 131*  K 3.4*  CL 91*  CO2 30  GLUCOSE 126*  BUN 15  CREATININE 0.87  CALCIUM 8.1*   GFR: Estimated Creatinine Clearance: 127.5 mL/min (by C-G formula based on SCr of 0.87 mg/dL). Liver Function Tests: Recent Labs  Lab 12/01/19 0148  AST 25  ALT 22  ALKPHOS 120  BILITOT 0.7  PROT 6.4*  ALBUMIN 3.1*   No results for input(s): LIPASE, AMYLASE in the last 168 hours. No results for input(s): AMMONIA in the last 168 hours. Coagulation Profile: No results  for input(s): INR, PROTIME in the last 168 hours. Cardiac Enzymes: No results for input(s): CKTOTAL, CKMB, CKMBINDEX, TROPONINI in the last 168 hours. BNP (last 3 results) No results for input(s): PROBNP in the last 8760 hours. HbA1C: No results for input(s): HGBA1C in the last 72 hours. CBG: No results for input(s): GLUCAP in the last 168 hours. Lipid Profile: No results for input(s): CHOL, HDL, LDLCALC, TRIG, CHOLHDL, LDLDIRECT in the last 72 hours. Thyroid Function Tests: No results for input(s): TSH, T4TOTAL, FREET4, T3FREE, THYROIDAB in the last 72 hours. Anemia Panel: No results for input(s): VITAMINB12, FOLATE, FERRITIN, TIBC, IRON, RETICCTPCT in the last 72 hours. Urine analysis:    Component Value Date/Time   COLORURINE STRAW (A) 11/15/2019 2337   APPEARANCEUR CLEAR 11/15/2019 2337   LABSPEC 1.008 11/15/2019 2337   PHURINE 7.0 11/15/2019 2337   GLUCOSEU NEGATIVE 11/15/2019 2337   HGBUR MODERATE (A) 11/15/2019 2337   [place] NEGATIVE 11/15/2019 2337   [place] 11/15/2019 2337   PROTEINUR 100 (A) 11/15/2019 2337    NITRITE NEGATIVE 11/15/2019 2337   LEUKOCYTESUR NEGATIVE 11/15/2019 2337    Radiological Exams on Admission: DG Chest Port 1 View  Result Date: 12/01/2019 CLINICAL DATA:  Cough and shortness of breath, worsened over the past week and a half. EXAM: PORTABLE CHEST 1 VIEW COMPARISON:  11/15/2019 FINDINGS: Unchanged elevation of right hemidiaphragm. Minimal patchy opacities in the basilar periphery of both lungs, new from prior. Unchanged heart size and mediastinal contours. No pneumothorax, large pleural effusion or pulmonary edema. No acute osseous abnormalities. IMPRESSION: Minimal patchy bibasilar opacities, new from prior, may be atelectasis or pneumonia. Electronically Signed   By: Keith Rake M.D.   On: 12/01/2019 01:29    EKG: Independently reviewed. Vent. rate 73 BPM PR interval * ms QRS duration 114 ms QT/QTc 426/470 ms P-R-T axes 55 37 100 Sinus rhythm Borderline intraventricular conduction delay Borderline repolarization abnormality  Assessment/Plan Principal Problem:   CAP (community acquired pneumonia) Observation/telemetry. Continue supplemental oxygen. Continue Levaquin 750 every 24 hours. Continue bronchodilators Continue Solu-Medrol. Check sputum Gram stain, culture and sensitivity.  Active Problems:   Hypertension Continue amlodipine 10 mg p.o. daily.  Continue chlorthalidone 25 mg p.o. daily per Continue metoprolol 200 mg p.o. daily. Monitor BP, HR, renal function/electrolytes    Type 2 diabetes mellitus (HCC) Carbohydrate modified diet. Continue 70/30 insulin 65 units SQ twice daily. CBG monitoring with RI SS.    Hypokalemia Replacing. Follow-up potassium level.    Hyperlipidemia On atorvastatin.    BPH (benign prostatic hyperplasia) Continue tamsulosin.    Normocytic anemia Check anemia panel. Check stool occult blood.   DVT prophylaxis: Lovenox SQ. Code Status: Full code. Family Communication: Delete Disposition Plan: Admit for  CAP/reactive airways treatment Consults called:  Admission status: Observation/telemetry.   Reubin Milan MD [facility]  If 7PM-7AM, please contact night-coverage [email or web address]  12/01/2019, 6:16 AM   This document was prepared using Dragon voice recognition software and may contain some unintended transcription errors.

## 2019-12-01 NOTE — Progress Notes (Signed)
Remdesivir - Pharmacy Brief Note   O:  ALT: 22 CXR: minimal patchy bibasilar opacities SpO2: 97% on RA   A/P:  Remdesivir 200 mg IVPB once followed by 100 mg IVPB daily x 4 days.   Vertis Kelch, PharmD, BCPS PGY2 Cardiology Pharmacy Resident 12/01/2019       6:16 PM  Please check [email or web address] for unit-specific pharmacist phone numbers

## 2019-12-01 NOTE — Progress Notes (Addendum)
ASSUMPTION OF CARE NOTE   12/01/2019 2:42 PM  Shane Chambers was seen and examined.  The H&P by the admitting provider, orders, imaging was reviewed.  Please see new orders.  Will continue to follow.   ** Update: Covid positive:  Starting remdesivir, solumedrol given in ED.  Continue supplemental oxygen.   Vitals:   12/01/19 0900 12/01/19 0930  BP: (!) 155/70 (!) 159/75  Pulse: 86 85  Resp: 16 16  Temp:    SpO2: 96% 98%    Results for orders placed or performed during the hospital encounter of 12/01/19  Comprehensive metabolic panel  Result Value Ref Range   Sodium 131 (L) 135 - 145 mmol/L   Potassium 3.4 (L) 3.5 - 5.1 mmol/L   Chloride 91 (L) 98 - 111 mmol/L   CO2 30 22 - 32 mmol/L   Glucose, Bld 126 (H) 70 - 99 mg/dL   BUN 15 6 - 20 mg/dL   Creatinine, Ser 0.87 0.61 - 1.24 mg/dL   Calcium 8.1 (L) 8.9 - 10.3 mg/dL   Total Protein 6.4 (L) 6.5 - 8.1 g/dL   Albumin 3.1 (L) 3.5 - 5.0 g/dL   AST 25 15 - 41 U/L   ALT 22 0 - 44 U/L   Alkaline Phosphatase 120 38 - 126 U/L   Total Bilirubin 0.7 0.3 - 1.2 mg/dL   GFR calc non Af Amer >60 >60 mL/min   GFR calc Af Amer >60 >60 mL/min   Anion gap 10 5 - 15  Lactic acid, plasma  Result Value Ref Range   Lactic Acid, Venous 1.4 0.5 - 1.9 mmol/L  CBC with Differential  Result Value Ref Range   WBC 10.0 4.0 - 10.5 K/uL   RBC 4.32 4.22 - 5.81 MIL/uL   Hemoglobin 12.0 (L) 13.0 - 17.0 g/dL   HCT 37.6 (L) 39.0 - 52.0 %   MCV 87.0 80.0 - 100.0 fL   MCH 27.8 26.0 - 34.0 pg   MCHC 31.9 30.0 - 36.0 g/dL   RDW 13.0 11.5 - 15.5 %   Platelets 206 150 - 400 K/uL   nRBC 0.0 0.0 - 0.2 %   Neutrophils Relative % 76 %   Neutro Abs 7.7 1.7 - 7.7 K/uL   Lymphocytes Relative 12 %   Lymphs Abs 1.2 0.7 - 4.0 K/uL   Monocytes Relative 9 %   Monocytes Absolute 0.9 0.1 - 1.0 K/uL   Eosinophils Relative 1 %   Eosinophils Absolute 0.1 0.0 - 0.5 K/uL   Basophils Relative 0 %   Basophils Absolute 0.0 0.0 - 0.1 K/uL   Immature Granulocytes 2 %   Abs  Immature Granulocytes 0.16 (H) 0.00 - 0.07 K/uL  Brain natriuretic peptide  Result Value Ref Range   B Natriuretic Peptide 93.0 0.0 - 100.0 pg/mL  Vitamin B12  Result Value Ref Range   Vitamin B-12 455 180 - 914 pg/mL  Folate  Result Value Ref Range   Folate 29.6 >5.9 ng/mL  Iron and TIBC  Result Value Ref Range   Iron 18 (L) 45 - 182 ug/dL   TIBC 376 250 - 450 ug/dL   Saturation Ratios 5 (L) 17.9 - 39.5 %   UIBC 358 ug/dL  Ferritin  Result Value Ref Range   Ferritin 45 24 - 336 ng/mL  Reticulocytes  Result Value Ref Range   Retic Ct Pct 1.4 0.4 - 3.1 %   RBC. 4.30 4.22 - 5.81 MIL/uL   Retic Count, Absolute  58.1 19.0 - 186.0 K/uL   Immature Retic Fract 13.0 2.3 - 15.9 %  CBG monitoring, ED  Result Value Ref Range   Glucose-Capillary 320 (H) 70 - 99 mg/dL  Troponin I (High Sensitivity)  Result Value Ref Range   Troponin I (High Sensitivity) 64 (H) <18 ng/L  Troponin I (High Sensitivity)  Result Value Ref Range   Troponin I (High Sensitivity) 55 (H) <18 ng/L   Time Spent 31 minutes   Murvin Natal, MD [facility]   12/01/2019 12:43 AM How to contact the [facility] Attending or Consulting provider [place] or covering provider during after hours [place], for this patient?  1. Check the care team in [facility] and look for a) attending/consulting [facility] provider listed and b) the [facility] team listed 2. Log into [email or web address] and use [place]'s universal password to access. If you do not have the password, please contact the hospital operator. 3. Locate the [facility] provider you are looking for under [facility] and page to a number that you can be directly reached. 4. If you still have difficulty reaching the provider, please page the [facility] (Director on Call) for the Hospitalists listed on amion for assistance.

## 2019-12-01 NOTE — ED Notes (Signed)
Pt placed on 2 LPM via [place] Pt's O2 was 90% on RA, O2 increased to 95% after oxygen was applied.

## 2019-12-01 NOTE — ED Provider Notes (Signed)
[facility] EMERGENCY DEPARTMENT Provider Note   CSN: [id number] Arrival date & time: 12/01/19  0016   Time seen 2:05 AM  History Chief Complaint  Patient presents with  . Shortness of Breath    Shane Chambers is a 59 y.o. male.  HPI patient states he has been having trouble breathing for a few weeks.  He was admitted to the hospital from January 5-7 with a negative Covid and influenza tests for acute bronchitis with bronchospasm.  He was treated with IV steroids, Combivent inhaler and discharged home with doxycycline.  Evidently he presented with sepsis.  He states over the past week his symptoms have been getting worse.  Patient denies fever but states he does have rhinorrhea.  He has cough with nausea but no vomiting or diarrhea.  He states he is wheezing.  He has a "rescue inhaler" that he uses and states it did not help at all today.  He also has been having some intermittent swelling of his lower extremities.  He states he used to smoke but quit smoking.  He has never had to see a cardiologist.  PCP [name], MD      Past Medical History:  Diagnosis Date  . Depression   . Diabetes mellitus without complication ([place])   . Hypertension     Patient Active Problem List   Diagnosis Date Noted  . CAP (community acquired pneumonia) 12/01/2019  . SOB (shortness of breath) 11/16/2019  . Sepsis ([place]) 11/16/2019  . Hypertension   . Diabetes mellitus without complication ([place])   . Depression   . Acute bronchitis with bronchospasm 11/15/2019    Past Surgical History:  Procedure Laterality Date  . DENTAL SURGERY         No family history on file.  Social History   Tobacco Use  . Smoking status: Former Research scientist (life sciences)  . Smokeless tobacco: Never Used  Substance Use Topics  . Alcohol use: Yes    Comment: rarely  . Drug use: No  Lives with his mother  Home Medications Prior to Admission medications   Medication Sig Start Date End Date Taking? Authorizing Provider    albuterol (VENTOLIN HFA) 108 (90 Base) MCG/ACT inhaler Inhale 1-2 puffs into the lungs every 6 (six) hours as needed for wheezing or shortness of breath.  07/27/19   [provider]  amLODipine (NORVASC) 10 MG tablet Take 10 mg by mouth daily.    [provider]  aspirin 81 MG EC tablet Take 81 mg by mouth daily.  03/10/16   [provider]  atorvastatin (LIPITOR) 40 MG tablet Take 40 mg by mouth daily. 10/20/19   [provider]  chlorthalidone (HYGROTON) 25 MG tablet Take 25 mg by mouth daily.    [provider]  diclofenac Sodium (VOLTAREN) 1 % GEL Apply 2 g topically daily as needed (for pain).     [provider]  fluticasone (VERAMYST) 27.5 MCG/SPRAY nasal spray Place 2 sprays into the nose daily as needed for rhinitis or allergies.     [provider]  gabapentin (NEURONTIN) 300 MG capsule Take 900 mg by mouth 2 (two) times daily.     [provider]  metFORMIN (GLUCOPHAGE) 1000 MG tablet Take 1,000 mg by mouth 2 (two) times daily with a meal.    [provider]  metoprolol (TOPROL-XL) 200 MG 24 hr tablet Take 200 mg by mouth daily.    [provider]  Multiple Vitamins-Minerals (TAB-A-VITE) TABS Take 1  tablet by mouth daily. 07/27/19   [provider]  NOVOLOG MIX 70/30 (70-30) 100 UNIT/ML injection Inject 65 Units into the skin 2 (two) times daily with a meal.  10/28/19   [provider]  omeprazole (PRILOSEC) 40 MG capsule Take 40 mg by mouth daily.    [provider]  Pseudoephedrine-APAP-DM (DAYQUIL MULTI-SYMPTOM COLD/FLU PO) Take 1-2 capsules by mouth daily as needed (cold symptoms).    [provider]  tamsulosin (FLOMAX) 0.4 MG CAPS capsule Take 0.4 mg by mouth daily.    [provider]  venlafaxine XR (EFFEXOR-XR) 150 MG 24 hr capsule Take 150 mg by mouth daily with breakfast.    [provider]    Allergies    Ace inhibitors, Other, and  Penicillins  Review of Systems   Review of Systems  All other systems reviewed and are negative.   Physical Exam Updated Vital Signs BP 118/75   Pulse 85   Temp 98.2 F (36.8 C) (Oral)   Resp (!) 22   Ht 5\' 9"  (1.753 m)   Wt 131.6 kg   SpO2 95%   BMI 42.84 kg/m   Physical Exam Vitals and nursing note reviewed.  Constitutional:      General: He is not in acute distress.    Appearance: Normal appearance. He is well-developed. He is not ill-appearing or toxic-appearing.  HENT:     Head: Normocephalic and atraumatic.     Right Ear: External ear normal.     Left Ear: External ear normal.     Nose: Nose normal. No mucosal edema or rhinorrhea.     Mouth/Throat:     Dentition: No dental abscesses.     Pharynx: No uvula swelling.  Eyes:     Extraocular Movements: Extraocular movements intact.     Conjunctiva/sclera: Conjunctivae normal.     Pupils: Pupils are equal, round, and reactive to light.  Cardiovascular:     Rate and Rhythm: Normal rate and regular rhythm.     Heart sounds: Normal heart sounds. No murmur. No friction rub. No gallop.   Pulmonary:     Effort: Respiratory distress present.     Breath sounds: Decreased air movement present. Decreased breath sounds present. No wheezing, rhonchi or rales.     Comments: Patient appears to get short of breath when he talks Chest:     Chest wall: No tenderness or crepitus.  Abdominal:     General: Bowel sounds are normal. There is no distension.     Palpations: Abdomen is soft.     Tenderness: There is no abdominal tenderness. There is no guarding or rebound.  Musculoskeletal:        General: No tenderness. Normal range of motion.     Cervical back: Full passive range of motion without pain, normal range of motion and neck supple.     Right lower leg: Edema present.     Left lower leg: Edema present.     Comments: Moves all extremities well.  1+ pitting edema of feet and ankles  Skin:    General: Skin is warm and dry.       Coloration: Skin is not pale.     Findings: No erythema or rash.  Neurological:     Mental Status: He is alert and oriented to person, place, and time.     Cranial Nerves: No cranial nerve deficit.  Psychiatric:        Mood and Affect: Mood is not anxious.  Speech: Speech normal.        Behavior: Behavior normal.     ED Results / Procedures / Treatments   Labs (all labs ordered are listed, but only abnormal results are displayed) Results for orders placed or performed during the hospital encounter of 12/01/19  Comprehensive metabolic panel  Result Value Ref Range   Sodium 131 (L) 135 - 145 mmol/L   Potassium 3.4 (L) 3.5 - 5.1 mmol/L   Chloride 91 (L) 98 - 111 mmol/L   CO2 30 22 - 32 mmol/L   Glucose, Bld 126 (H) 70 - 99 mg/dL   BUN 15 6 - 20 mg/dL   Creatinine, Ser 0.87 0.61 - 1.24 mg/dL   Calcium 8.1 (L) 8.9 - 10.3 mg/dL   Total Protein 6.4 (L) 6.5 - 8.1 g/dL   Albumin 3.1 (L) 3.5 - 5.0 g/dL   AST 25 15 - 41 U/L   ALT 22 0 - 44 U/L   Alkaline Phosphatase 120 38 - 126 U/L   Total Bilirubin 0.7 0.3 - 1.2 mg/dL   GFR calc non Af Amer >60 >60 mL/min   GFR calc Af Amer >60 >60 mL/min   Anion gap 10 5 - 15  Lactic acid, plasma  Result Value Ref Range   Lactic Acid, Venous 1.4 0.5 - 1.9 mmol/L  CBC with Differential  Result Value Ref Range   WBC 10.0 4.0 - 10.5 K/uL   RBC 4.32 4.22 - 5.81 MIL/uL   Hemoglobin 12.0 (L) 13.0 - 17.0 g/dL   HCT 37.6 (L) 39.0 - 52.0 %   MCV 87.0 80.0 - 100.0 fL   MCH 27.8 26.0 - 34.0 pg   MCHC 31.9 30.0 - 36.0 g/dL   RDW 13.0 11.5 - 15.5 %   Platelets 206 150 - 400 K/uL   nRBC 0.0 0.0 - 0.2 %   Neutrophils Relative % 76 %   Neutro Abs 7.7 1.7 - 7.7 K/uL   Lymphocytes Relative 12 %   Lymphs Abs 1.2 0.7 - 4.0 K/uL   Monocytes Relative 9 %   Monocytes Absolute 0.9 0.1 - 1.0 K/uL   Eosinophils Relative 1 %   Eosinophils Absolute 0.1 0.0 - 0.5 K/uL   Basophils Relative 0 %   Basophils Absolute 0.0 0.0 - 0.1 K/uL   Immature  Granulocytes 2 %   Abs Immature Granulocytes 0.16 (H) 0.00 - 0.07 K/uL  Brain natriuretic peptide  Result Value Ref Range   B Natriuretic Peptide 93.0 0.0 - 100.0 pg/mL  Troponin I (High Sensitivity)  Result Value Ref Range   Troponin I (High Sensitivity) 64 (H) <18 ng/L   Laboratory interpretation all normal except hypokalemia, mild hypokalemia, malnutrition mild anemia    EKG EKG Interpretation  Date/Time:  Thursday December 01 2019 00:51:53 EST Ventricular Rate:  73 PR Interval:    QRS Duration: 114 QT Interval:  426 QTC Calculation: 470 R Axis:   37 Text Interpretation: Sinus rhythm Borderline intraventricular conduction delay Borderline repolarization abnormality No significant change since last tracing 15 Nov 2019 Confirmed by Rolland Porter [phone]) on 12/01/2019 1:03:13 AM   Radiology DG Chest Port 1 View  Result Date: 12/01/2019 CLINICAL DATA:  Cough and shortness of breath, worsened over the past week and a half. EXAM: PORTABLE CHEST 1 VIEW COMPARISON:  11/15/2019 FINDINGS: Unchanged elevation of right hemidiaphragm. Minimal patchy opacities in the basilar periphery of both lungs, new from prior. Unchanged heart size and mediastinal contours. No pneumothorax, large pleural effusion or  pulmonary edema. No acute osseous abnormalities. IMPRESSION: Minimal patchy bibasilar opacities, new from prior, may be atelectasis or pneumonia. Electronically Signed   By: Keith Rake M.D.   On: 12/01/2019 01:29    Procedures .Critical Care Performed by: Rolland Porter, MD Authorized by: Rolland Porter, MD   Critical care provider statement:    Critical care time (minutes):  31   Critical care was necessary to treat or prevent imminent or life-threatening deterioration of the following conditions:  Respiratory failure   Critical care was time spent personally by me on the following activities:  Discussions with consultants, evaluation of patient's response to treatment, examination of patient,  obtaining history from patient or surrogate, ordering and review of radiographic studies, ordering and review of laboratory studies, pulse oximetry, re-evaluation of patient's condition and review of old charts   (including critical care time)  Medications Ordered in ED Medications  albuterol (PROVENTIL) (2.5 MG/3ML) 0.083% nebulizer solution 5 mg (5 mg Nebulization Not Given 12/01/19 0227)  ipratropium (ATROVENT HFA) inhaler 2 puff (2 puffs Inhalation Not Given 12/01/19 0440)  albuterol (VENTOLIN HFA) 108 (90 Base) MCG/ACT inhaler 8 puff (8 puffs Inhalation Given 12/01/19 0325)  methylPREDNISolone sodium succinate (SOLU-MEDROL) 125 mg/2 mL injection 125 mg (125 mg Intravenous Given 12/01/19 0323)  levofloxacin (LEVAQUIN) IVPB 750 mg (0 mg Intravenous Stopped 12/01/19 0537)  albuterol (VENTOLIN HFA) 108 (90 Base) MCG/ACT inhaler 8 puff (8 puffs Inhalation Given 12/01/19 0455)    ED Course  I have reviewed the triage vital signs and the nursing notes.  Pertinent labs & imaging results that were available during my care of the patient were reviewed by me and considered in my medical decision making (see chart for details).    MDM Rules/Calculators/A&P                      Patient was noted to be hypoxic in triage with a pulse ox 89%.  He was placed on 2 L/min nasal cannula oxygen and his oxygen improved from 93 up to 95% after he got his inhalers.  Patient had had a nebulizer ordered however at the time of my exam he had not had one yet.  I ordered a albuterol and Atrovent inhaler, 8 puffs and 2 puffs respectively.  After reviewing his x-ray he was given Levaquin for his possible pneumonia.  He has a penicillin allergy.  He was also given IV steroids for his bronchospasm.  Recheck at 4:00 AM patient states he still feels like he is having wheezing and shortness of breath, his inhalers were repeated.  5:09 AM patient was discussed with Dr. Olevia Bowens, hospitalist he will admit.  Recheck at 5:45 AM  patient has had his second round of albuterol with Atrovent inhalers.  He states he is minimally better but does not want to third round yet.  We discussed his results which was now that he has a pneumonia and his oxygen was low in triage.  He is agreeable to being admitted.  Covid test is pending  Shane Chambers was evaluated in Emergency Department on 12/01/2019 for the symptoms described in the history of present illness. He was evaluated in the context of the global COVID-19 pandemic, which necessitated consideration that the patient might be at risk for infection with the SARS-CoV-2 virus that causes COVID-19. Institutional protocols and algorithms that pertain to the evaluation of patients at risk for COVID-19 are in a state of rapid change based on information released by regulatory  bodies including the CDC and federal and state organizations. These policies and algorithms were followed during the patient's care in the ED.   Final Clinical Impression(s) / ED Diagnoses Final diagnoses:  SOB (shortness of breath)    Rx / DC Orders Plan admission  Rolland Porter, MD, Barbette Or, MD 12/01/19 [phone]

## 2019-12-01 NOTE — ED Triage Notes (Signed)
Pt with sob x 1.5 weeks but states it is getting worse. Pt also have productive cough.

## 2019-12-02 DIAGNOSIS — J1282 Pneumonia due to coronavirus disease 2019: Secondary | ICD-10-CM

## 2019-12-02 DIAGNOSIS — U071 COVID-19: Principal | ICD-10-CM

## 2019-12-02 DIAGNOSIS — N400 Enlarged prostate without lower urinary tract symptoms: Secondary | ICD-10-CM

## 2019-12-02 DIAGNOSIS — E559 Vitamin D deficiency, unspecified: Secondary | ICD-10-CM

## 2019-12-02 LAB — CBC WITH DIFFERENTIAL/PLATELET
Abs Immature Granulocytes: 0.11 10*3/uL — ABNORMAL HIGH (ref 0.00–0.07)
Basophils Absolute: 0 10*3/uL (ref 0.0–0.1)
Basophils Relative: 0 %
Eosinophils Absolute: 0 10*3/uL (ref 0.0–0.5)
Eosinophils Relative: 0 %
HCT: 40.5 % (ref 39.0–52.0)
Hemoglobin: 12.9 g/dL — ABNORMAL LOW (ref 13.0–17.0)
Immature Granulocytes: 1 %
Lymphocytes Relative: 7 %
Lymphs Abs: 1 10*3/uL (ref 0.7–4.0)
MCH: 27.4 pg (ref 26.0–34.0)
MCHC: 31.9 g/dL (ref 30.0–36.0)
MCV: 86.2 fL (ref 80.0–100.0)
Monocytes Absolute: 0.9 10*3/uL (ref 0.1–1.0)
Monocytes Relative: 7 %
Neutro Abs: 11.4 10*3/uL — ABNORMAL HIGH (ref 1.7–7.7)
Neutrophils Relative %: 85 %
Platelets: 238 10*3/uL (ref 150–400)
RBC: 4.7 MIL/uL (ref 4.22–5.81)
RDW: 13 % (ref 11.5–15.5)
WBC: 13.4 10*3/uL — ABNORMAL HIGH (ref 4.0–10.5)
nRBC: 0 % (ref 0.0–0.2)

## 2019-12-02 LAB — PHOSPHORUS: Phosphorus: 2.9 mg/dL (ref 2.5–4.6)

## 2019-12-02 LAB — C-REACTIVE PROTEIN: CRP: 7.6 mg/dL — ABNORMAL HIGH (ref <1.0)

## 2019-12-02 LAB — GLUCOSE, CAPILLARY
Glucose-Capillary: 328 mg/dL — ABNORMAL HIGH (ref 70–99)
Glucose-Capillary: 227 mg/dL — ABNORMAL HIGH (ref 70–99)
Glucose-Capillary: 197 mg/dL — ABNORMAL HIGH (ref 70–99)
Glucose-Capillary: 137 mg/dL — ABNORMAL HIGH (ref 70–99)
Glucose-Capillary: 279 mg/dL — ABNORMAL HIGH (ref 70–99)

## 2019-12-02 LAB — FERRITIN: Ferritin: 87 ng/mL (ref 24–336)

## 2019-12-02 LAB — D-DIMER, QUANTITATIVE: D-Dimer, Quant: 0.64 ug/mL-FEU — ABNORMAL HIGH (ref 0.00–0.50)

## 2019-12-02 MED ORDER — IPRATROPIUM-ALBUTEROL 20-100 MCG/ACT IN AERS
1.0000 | INHALATION_SPRAY | Freq: Three times a day (TID) | RESPIRATORY_TRACT | Status: DC
  Administered 2019-12-03 – 2019-12-04 (×6): 1 via RESPIRATORY_TRACT

## 2019-12-02 MED ORDER — VITAMIN D (ERGOCALCIFEROL) 1.25 MG (50000 UNIT) PO CAPS
50000.0000 [IU] | ORAL_CAPSULE | ORAL | Status: DC
  Administered 2019-12-02: 50000 [IU] via ORAL
  Filled 2019-12-02: qty 1

## 2019-12-02 MED ORDER — INSULIN ASPART PROT & ASPART (70-30 MIX) 100 UNIT/ML ~~LOC~~ SUSP
65.0000 [IU] | Freq: Two times a day (BID) | SUBCUTANEOUS | Status: DC
  Administered 2019-12-02 (×2): 65 [IU] via SUBCUTANEOUS
  Filled 2019-12-02: qty 10

## 2019-12-02 MED ORDER — DEXAMETHASONE 4 MG PO TABS
4.0000 mg | ORAL_TABLET | Freq: Every day | ORAL | Status: DC
  Administered 2019-12-02 – 2019-12-06 (×5): 4 mg via ORAL
  Filled 2019-12-02 (×5): qty 1

## 2019-12-02 NOTE — TOC Initial Note (Signed)
Transition of Care [facility]) - Initial/Assessment Note    Patient Details  Name: Shane Chambers MRN: [id number] Date of Birth: 12-23-1960  [facility] of [facility]) CM/SW Contact:    Sally-Ann Cutbirth, Chauncey Reading, RN Phone Number: 12/02/2019, 12:51 PM  Clinical Narrative:   Admit with Pneumonia due to Covid 38. From home. Recommended for SNF vs home with supervision (walked 22 feet minimal assist). Tried to discuss with patient via phone, patient SOB and falling asleep during conversation. Discussed with bedside RN, who states patient has been falling asleep consistently today.   Of note patient was discharged on 1/7, with no PT needs recommended, walking 25 feet with min guard.   A wheelchair was ordered on 1/7, patient reporting to physical therapy that he never received item. Call to [facility] to follow up, patient not in system. Will order WC again if patient does go home.                   Expected Discharge Plan: [place] Barriers to Discharge: Continued Medical Work up   Patient Goals and CMS Choice        Expected Discharge Plan and Services Expected Discharge Plan: [place]   Discharge Planning Services: CM Consult                          Activities of Daily Living Home Assistive Devices/Equipment: Cane (specify quad or straight), Walker (specify type), Eyeglasses, Bedside commode/3-in-1 ADL Screening (condition at time of admission) Patient's cognitive ability adequate to safely complete daily activities?: Yes Is the patient deaf or have difficulty hearing?: No Does the patient have difficulty seeing, even when wearing glasses/contacts?: No Does the patient have difficulty concentrating, remembering, or making decisions?: No Patient able to express need for assistance with ADLs?: Yes Does the patient have difficulty dressing or bathing?: Yes Independently performs ADLs?: No Communication: Independent Dressing (OT): Needs  assistance Is this a change from baseline?: Pre-admission baseline Grooming: Independent Feeding: Independent Is this a change from baseline?: Pre-admission baseline Bathing: Needs assistance Is this a change from baseline?: Pre-admission baseline Toileting: Independent In/Out Bed: Independent Walks in Home: Needs assistance Is this a change from baseline?: Pre-admission baseline Does the patient have difficulty walking or climbing stairs?: Yes Weakness of Legs: Both Weakness of Arms/Hands: None  Permission Sought/Granted                  Emotional Assessment   Attitude/Demeanor/Rapport: Unable to Assess, Lethargic   Orientation: : Oriented to Self, Oriented to Place, Oriented to  Time      Admission diagnosis:  SOB (shortness of breath) [R06.02] Hypoxia [R09.02] CAP (community acquired pneumonia) [J18.9] Bronchospasm, acute [J98.01] Community acquired pneumonia, unspecified laterality [J18.9] Pneumonia due to COVID-19 virus [U07.1, J12.82] Patient Active Problem List   Diagnosis Date Noted  . Severe Vitamin D deficiency 12/02/2019  . CAP (community acquired pneumonia) 12/01/2019  . Hypokalemia 12/01/2019  . Hyperlipidemia 12/01/2019  . BPH (benign prostatic hyperplasia) 12/01/2019  . Normocytic anemia 12/01/2019  . Pneumonia due to COVID-19 virus 12/01/2019  . Hypoxia   . SOB (shortness of breath) 11/16/2019  . Sepsis ([place]) 11/16/2019  . Hypertension   . Type 2 diabetes mellitus ([place])   . Depression   . Acute bronchitis with bronchospasm 11/15/2019   PCP:  [name], MD Pharmacy:   [id number] - [place], [place]  [id number] [place] [id number] Phone: [phone] Fax: [phone]  [id number] - [place], [place] [address] [place] [address] Phone: [phone] Fax: [phone]     Social Determinants of Health (SDOH)  Interventions    Readmission Risk Interventions No flowsheet data found.

## 2019-12-02 NOTE — Progress Notes (Signed)
[place] score yellow, pulse 108, Dr. Olevia Bowens notified, placing patient on telemetry.

## 2019-12-02 NOTE — Plan of Care (Signed)
  Problem: Acute Rehab PT Goals(only PT should resolve) Goal: Pt Will Go Supine/Side To Sit Outcome: Progressing Flowsheets (Taken 12/02/2019 0954) Pt will go Supine/Side to Sit: with supervision Goal: Patient Will Transfer Sit To/From Stand Outcome: Progressing Flowsheets (Taken 12/02/2019 0954) Patient will transfer sit to/from stand:  with min guard assist  with supervision Goal: Pt Will Transfer Bed To Chair/Chair To Bed Outcome: Progressing Flowsheets (Taken 12/02/2019 0954) Pt will Transfer Bed to Chair/Chair to Bed:  min guard assist  with supervision Goal: Pt Will Ambulate Outcome: Progressing Flowsheets (Taken 12/02/2019 0954) Pt will Ambulate:  50 feet  with min guard assist  with minimal assist  with rolling walker  with cane Note: Try using Hemi-walker if unsafe with SPC   9:55 AM, 12/02/19 Lonell Grandchild, MPT Physical Therapist with [facility] [phone] office [phone] mobile phone

## 2019-12-02 NOTE — Progress Notes (Signed)
PROGRESS NOTE [facility]  MACKLAN GUAGLIARDO  [id number]  DOB: [birth year]  DOA: 12/01/2019 PCP: [name], MD  Brief Admission Hx: 59 y.o. male with medical history significant of depression, type 2 diabetes, hyperlipidemia, hypertension who was admitted from 11/15/2019 until 11/17/2019 due to dyspnea and now returns with worsening dyspnea, productive cough associated with pleuritic chest pain, sore throat and wheezing.  He is Covid positive with pneumonia.    MDM/Assessment & Plan:   1. Covid pneumonia-patient remains ill.  He says that he does not feel well but his shortness of breath seems to be improving.  He still requires supplemental oxygen.  Continue remdesivir and steroids.  Continue to monitor inflammatory markers.  Continue supportive therapies.  Continue vitamins. 2. Severe vitamin D deficiency-he has been started on supplementation with high-dose vitamin D. 3. Type 2 diabetes mellitus with hyperglycemia, poorly controlled as evidenced by hemoglobin A1c of 9.8%-he has been restarted on 70/30 insulin and will likely need to adjust dose as needed.  Monitor CBG closely provide supplemental sliding scale coverage for elevated blood glucose readings.  We do anticipate some steroid-induced hyperglycemia and may need to adjust doses as needed.  Check CBG 5 times per day. 4. Hypokalemia-this has been repleted we will continue to follow closely. 5. Hyperlipidemia-continue atorvastatin daily. 6. Normocytic anemia-his iron levels are fairly low.  We will start some iron supplement.  Hemoccult stools. 7. BPH-continue tamsulosin daily.  DVT prophylaxis: Lovenox Code Status: Full Family Communication: Patient updated at bedside Disposition Plan: Continue inpatient management for IV therapies.  Consultants:    Procedures:    Antimicrobials:     Subjective: Patient says he feels ill.  He is having some coughing and shortness of breath and wheezing.  Objective: Vitals:    12/02/19 0531 12/02/19 0751 12/02/19 1343 12/02/19 1401  BP: (!) 157/84   128/75  Pulse: (!) 105   83  Resp: (!) 21   18  Temp: 98.2 F (36.8 C)   99.5 F (37.5 C)  TempSrc:    Oral  SpO2: 95% 96% 96% 94%  Weight:      Height:        Intake/Output Summary (Last 24 hours) at 12/02/2019 1643 Last data filed at 12/02/2019 1300 Gross per 24 hour  Intake 370 ml  Output 1200 ml  Net -830 ml   Filed Weights   12/01/19 0040 12/01/19 2316  Weight: 131.6 kg (!) 137.2 kg   REVIEW OF SYSTEMS  As per history otherwise all reviewed and reported negative  Exam:  General exam: Chronically ill-appearing morbidly obese male he is lying in the bed he is awake and arousable in no apparent distress. Respiratory system:  No increased work of breathing. Cardiovascular system: S1 & S2 heard. No JVD, murmurs, gallops, clicks or pedal edema. Gastrointestinal system: Abdomen is nondistended, soft and nontender. Normal bowel sounds heard. Central nervous system: Alert and oriented. No focal neurological deficits. Extremities: no CCE.  Data Reviewed: Basic Metabolic Panel: Recent Labs  Lab 12/01/19 0148 12/02/19 0618  NA 131*  --   K 3.4*  --   CL 91*  --   CO2 30  --   GLUCOSE 126*  --   BUN 15  --   CREATININE 0.87  --   CALCIUM 8.1*  --   PHOS  --  2.9   Liver Function Tests: Recent Labs  Lab 12/01/19 0148  AST 25  ALT 22  ALKPHOS 120  BILITOT 0.7  PROT 6.4*  ALBUMIN 3.1*   No results for input(s): LIPASE, AMYLASE in the last 168 hours. No results for input(s): AMMONIA in the last 168 hours. CBC: Recent Labs  Lab 12/01/19 0148 12/02/19 0618  WBC 10.0 13.4*  NEUTROABS 7.7 11.4*  HGB 12.0* 12.9*  HCT 37.6* 40.5  MCV 87.0 86.2  PLT 206 238   Cardiac Enzymes: No results for input(s): CKTOTAL, CKMB, CKMBINDEX, TROPONINI in the last 168 hours. CBG (last 3)  Recent Labs    12/02/19 0234 12/02/19 0759 12/02/19 1144  GLUCAP 328* 227* 197*   Recent Results (from the  past 240 hour(s))  SARS CORONAVIRUS 2 (TAT 6-24 HRS) Nasopharyngeal Nasopharyngeal Swab     Status: Abnormal   Collection Time: 12/01/19  2:19 AM   Specimen: Nasopharyngeal Swab  Result Value Ref Range Status   SARS Coronavirus 2 POSITIVE (A) NEGATIVE Final    Comment: emailed L. [place] RN 15:35 12/01/19 ([email or web address]) (NOTE) SARS-CoV-2 target nucleic acids are DETECTED. The SARS-CoV-2 RNA is generally detectable in upper and lower respiratory specimens during the acute phase of infection. Positive results are indicative of the presence of SARS-CoV-2 RNA. Clinical correlation with patient history and other diagnostic information is  necessary to determine patient infection status. Positive results do not rule out bacterial infection or co-infection with other viruses.  The expected result is Negative. Fact Sheet for Patients: SugarRoll.be Fact Sheet for Healthcare Providers: https://www.woods-mathews.com/ This test is not yet approved or cleared by the [place] FDA and  has been authorized for detection and/or diagnosis of SARS-CoV-2 by FDA under an Emergency Use Authorization (EUA). This EUA will remain  in effect (meaning this test can be used) for the duration of the COVID-19 declaration und er Section 564(b)(1) of the Act, 21 U.S.C. section 360bbb-3(b)(1), unless the authorization is terminated or revoked sooner. Performed at [facility], [place] [address], [place], [place] [address]      Studies: DG Chest Port 1 View  Result Date: 12/01/2019 CLINICAL DATA:  Cough and shortness of breath, worsened over the past week and a half. EXAM: PORTABLE CHEST 1 VIEW COMPARISON:  11/15/2019 FINDINGS: Unchanged elevation of right hemidiaphragm. Minimal patchy opacities in the basilar periphery of both lungs, new from prior. Unchanged heart size and mediastinal contours. No pneumothorax, large pleural effusion or pulmonary edema. No acute osseous  abnormalities. IMPRESSION: Minimal patchy bibasilar opacities, new from prior, may be atelectasis or pneumonia. Electronically Signed   By: Keith Rake M.D.   On: 12/01/2019 01:29   Scheduled Meds: . amLODipine  10 mg Oral Daily  . vitamin C  500 mg Oral Daily  . aspirin EC  81 mg Oral Daily  . atorvastatin  40 mg Oral Daily  . dexamethasone  4 mg Oral Daily  . enoxaparin (LOVENOX) injection  65 mg Subcutaneous Q24H  . gabapentin  900 mg Oral BID  . insulin aspart  0-20 Units Subcutaneous TID WC  . insulin aspart protamine- aspart  65 Units Subcutaneous BID WC  . Ipratropium-Albuterol  1 puff Inhalation Q6H  . metoprolol  200 mg Oral Daily  . pantoprazole  40 mg Oral Daily  . tamsulosin  0.4 mg Oral Daily  . venlafaxine XR  150 mg Oral Q breakfast  . Vitamin D (Ergocalciferol)  50,000 Units Oral Once per day on Mon Thu  . zinc sulfate  220 mg Oral Daily   Continuous Infusions: . remdesivir 100 mg in NS 100 mL 100 mg (12/02/19 0905)  Principal Problem:   Pneumonia due to COVID-19 virus Active Problems:   Hypertension   Type 2 diabetes mellitus ([place])   CAP (community acquired pneumonia)   Hypokalemia   Hyperlipidemia   BPH (benign prostatic hyperplasia)   Normocytic anemia   Severe Vitamin D deficiency  Time spent:   Irwin Brakeman, MD [facility] 12/02/2019, 4:43 PM    LOS: 1 day  How to contact the [facility] Attending or Consulting provider [place] or covering provider during after hours [place], for this patient?  1. Check the care team in [facility] and look for a) attending/consulting TRH provider listed and b) the [facility] team listed 2. Log into [email or web address] and use [place]'s universal password to access. If you do not have the password, please contact the hospital operator. 3. Locate the [facility] provider you are looking for under [facility] and page to a number that you can be directly reached. 4. If you still have difficulty reaching the provider, please  page the [facility] (Director on Call) for the Hospitalists listed on amion for assistance.

## 2019-12-02 NOTE — Evaluation (Signed)
Physical Therapy Evaluation Patient Details Name: Shane Chambers MRN: [id number] DOB: August 30, 1961 Today's Date: 12/02/2019   History of Present Illness  Shane Chambers is a 59 y.o. male with medical history significant of depression, type 2 diabetes, hyperlipidemia, hypertension who was admitted from 11/15/2019 until 11/17/2019 due to dyspnea and now returns with worsening dyspnea, productive cough associated with pleuritic chest pain, sore throat and wheezing.  He denies fever, chills, but feels tired and fatigued.  Denies hemoptysis, palpitations, dizziness, diaphoresis, PND, he occasionally gets lower extremity edema.  No abdominal pain, nausea, emesis, diarrhea, constipation.  No dysuria, frequency or hematuria.  Denies polyuria, polydipsia, polyphagia or blurred vision    Clinical Impression  Patient limited for functional mobility as stated below secondary to BLE weakness, fatigue and poor standing balance.  Patient limited to ambulation in room, had one near loss of balance using standard walker, required verbal cues to step closer to walker, SpO2 dropped from 91% to 86% on room air when walking and patient tolerated sitting up in chair after therapy - RN aware.  Patient will benefit from continued physical therapy in hospital and recommended venue below to increase strength, balance, endurance for safe ADLs and gait.     Follow Up Recommendations SNF;Supervision for mobility/OOB;Supervision - Intermittent    Equipment Recommendations  Wheelchair (measurements PT);Wheelchair cushion (measurements PT)    Recommendations for Other Services       Precautions / Restrictions Precautions Precautions: Fall Restrictions Weight Bearing Restrictions: No      Mobility  Bed Mobility Overal bed mobility: Needs Assistance Bed Mobility: Supine to Sit     Supine to sit: Min guard;Min assist     General bed mobility comments: slow labored movement, had to use bed rail  Transfers Overall  transfer level: Needs assistance Equipment used: Standard walker Transfers: Sit to/from Stand;Stand Pivot Transfers Sit to Stand: Min guard;Min assist Stand pivot transfers: Min guard;Min assist       General transfer comment: slow labored movement  Ambulation/Gait Ambulation/Gait assistance: Min assist Gait Distance (Feet): 22 Feet Assistive device: Standard walker Gait Pattern/deviations: Decreased step length - right;Decreased step length - left;Decreased stride length Gait velocity: decreased   General Gait Details: slow labored cadence unsteady cadence, required verbal cues to step closer to walker, one near loss of balance and limited secondary to c/o fatigue, on room air with SpO2 dropping from 91% to 86%  Stairs            Wheelchair Mobility    Modified Rankin (Stroke Patients Only)       Balance Overall balance assessment: Needs assistance Sitting-balance support: Feet supported;No upper extremity supported Sitting balance-Leahy Scale: Fair Sitting balance - Comments: fair/good seated at bedside   Standing balance support: During functional activity;Bilateral upper extremity supported Standing balance-Leahy Scale: Fair Standing balance comment: fair using RW                             Pertinent Vitals/Pain Pain Assessment: No/denies pain    Home Living Family/patient expects to be discharged to:: Private residence Living Arrangements: Parent Available Help at Discharge: Family;Available PRN/intermittently Type of Home: Mobile home Home Access: Stairs to enter Entrance Stairs-Rails: None Entrance Stairs-Number of Steps: 5-6 Home Layout: One level Home Equipment: Walker - standard;Bedside commode;Cane - single point      Prior Function Level of Independence: Needs assistance   Gait / Transfers Assistance Needed: household ambulator with SPC PRN, uses  electric scooters where available when shopping  ADL's / Homemaking Assistance  Needed: assisted by family        Hand Dominance        Extremity/Trunk Assessment   Upper Extremity Assessment Upper Extremity Assessment: Generalized weakness    Lower Extremity Assessment Lower Extremity Assessment: Generalized weakness    Cervical / Trunk Assessment Cervical / Trunk Assessment: Normal  Communication   Communication: No difficulties  Cognition Arousal/Alertness: Awake/alert Behavior During Therapy: WFL for tasks assessed/performed Overall Cognitive Status: Within Functional Limits for tasks assessed                                        General Comments      Exercises     Assessment/Plan    PT Assessment Patient needs continued PT services  PT Problem List Decreased strength;Decreased activity tolerance;Decreased balance;Decreased mobility       PT Treatment Interventions Balance training;Gait training;Stair training;Functional mobility training;Therapeutic activities;Therapeutic exercise;Patient/family education    PT Goals (Current goals can be found in the Care Plan section)  Acute Rehab PT Goals Patient Stated Goal: return home after rehab PT Goal Formulation: With patient Time For Goal Achievement: 12/16/19 Potential to Achieve Goals: Good    Frequency Min 3X/week   Barriers to discharge        Co-evaluation               AM-PAC PT "6 Clicks" Mobility  Outcome Measure Help needed turning from your back to your side while in a flat bed without using bedrails?: A Little Help needed moving from lying on your back to sitting on the side of a flat bed without using bedrails?: A Little Help needed moving to and from a bed to a chair (including a wheelchair)?: A Little Help needed standing up from a chair using your arms (e.g., wheelchair or bedside chair)?: A Little Help needed to walk in hospital room?: A Lot Help needed climbing 3-5 steps with a railing? : A Lot 6 Click Score: 16    End of Session  Equipment Utilized During Treatment: Oxygen Activity Tolerance: Patient tolerated treatment well;Patient limited by fatigue Patient left: in chair;with call bell/phone within reach Nurse Communication: Mobility status PT Visit Diagnosis: Unsteadiness on feet (R26.81);Other abnormalities of gait and mobility (R26.89);Muscle weakness (generalized) (M62.81)    Time: [id number] PT Time Calculation (min) (ACUTE ONLY): 29 min   Charges:   PT Evaluation $PT Eval Moderate Complexity: 1 Mod PT Treatments $Therapeutic Activity: 23-37 mins        9:52 AM, 12/02/19 Lonell Grandchild, MPT Physical Therapist with [facility] 336 [phone] office [phone] mobile phone

## 2019-12-03 LAB — CBC WITH DIFFERENTIAL/PLATELET
Abs Immature Granulocytes: 0.09 10*3/uL — ABNORMAL HIGH (ref 0.00–0.07)
Basophils Absolute: 0 10*3/uL (ref 0.0–0.1)
Basophils Relative: 0 %
Eosinophils Absolute: 0 10*3/uL (ref 0.0–0.5)
Eosinophils Relative: 0 %
HCT: 39 % (ref 39.0–52.0)
Hemoglobin: 12.4 g/dL — ABNORMAL LOW (ref 13.0–17.0)
Immature Granulocytes: 2 %
Lymphocytes Relative: 11 %
Lymphs Abs: 0.7 10*3/uL (ref 0.7–4.0)
MCH: 27.6 pg (ref 26.0–34.0)
MCHC: 31.8 g/dL (ref 30.0–36.0)
MCV: 86.9 fL (ref 80.0–100.0)
Monocytes Absolute: 0.7 10*3/uL (ref 0.1–1.0)
Monocytes Relative: 12 %
Neutro Abs: 4.6 10*3/uL (ref 1.7–7.7)
Neutrophils Relative %: 75 %
Platelets: 227 10*3/uL (ref 150–400)
RBC: 4.49 MIL/uL (ref 4.22–5.81)
RDW: 13.1 % (ref 11.5–15.5)
WBC: 6.1 10*3/uL (ref 4.0–10.5)
nRBC: 0 % (ref 0.0–0.2)

## 2019-12-03 LAB — FERRITIN: Ferritin: 108 ng/mL (ref 24–336)

## 2019-12-03 LAB — COMPREHENSIVE METABOLIC PANEL
ALT: 21 U/L (ref 0–44)
AST: 33 U/L (ref 15–41)
Albumin: 3.1 g/dL — ABNORMAL LOW (ref 3.5–5.0)
Alkaline Phosphatase: 102 U/L (ref 38–126)
Anion gap: 12 (ref 5–15)
BUN: 38 mg/dL — ABNORMAL HIGH (ref 6–20)
CO2: 27 mmol/L (ref 22–32)
Calcium: 8.9 mg/dL (ref 8.9–10.3)
Chloride: 92 mmol/L — ABNORMAL LOW (ref 98–111)
Creatinine, Ser: 1.09 mg/dL (ref 0.61–1.24)
GFR calc Af Amer: >60 mL/min (ref >60)
GFR calc non Af Amer: >60 mL/min (ref >60)
Glucose, Bld: 264 mg/dL — ABNORMAL HIGH (ref 70–99)
Potassium: 3.9 mmol/L (ref 3.5–5.1)
Sodium: 131 mmol/L — ABNORMAL LOW (ref 135–145)
Total Bilirubin: 0.6 mg/dL (ref 0.3–1.2)
Total Protein: 6.9 g/dL (ref 6.5–8.1)

## 2019-12-03 LAB — GLUCOSE, CAPILLARY
Glucose-Capillary: 330 mg/dL — ABNORMAL HIGH (ref 70–99)
Glucose-Capillary: 228 mg/dL — ABNORMAL HIGH (ref 70–99)
Glucose-Capillary: 307 mg/dL — ABNORMAL HIGH (ref 70–99)
Glucose-Capillary: 163 mg/dL — ABNORMAL HIGH (ref 70–99)
Glucose-Capillary: 192 mg/dL — ABNORMAL HIGH (ref 70–99)

## 2019-12-03 LAB — D-DIMER, QUANTITATIVE: D-Dimer, Quant: 0.78 ug/mL-FEU — ABNORMAL HIGH (ref 0.00–0.50)

## 2019-12-03 LAB — MAGNESIUM: Magnesium: 2 mg/dL (ref 1.7–2.4)

## 2019-12-03 LAB — PHOSPHORUS: Phosphorus: 4.1 mg/dL (ref 2.5–4.6)

## 2019-12-03 LAB — C-REACTIVE PROTEIN: CRP: 8.7 mg/dL — ABNORMAL HIGH (ref <1.0)

## 2019-12-03 MED ORDER — GUAIFENESIN ER 600 MG PO TB12
1200.0000 mg | ORAL_TABLET | Freq: Two times a day (BID) | ORAL | Status: DC
  Administered 2019-12-03 – 2019-12-06 (×7): 1200 mg via ORAL
  Filled 2019-12-03 (×6): qty 2

## 2019-12-03 MED ORDER — INSULIN ASPART PROT & ASPART (70-30 MIX) 100 UNIT/ML ~~LOC~~ SUSP
70.0000 [IU] | Freq: Two times a day (BID) | SUBCUTANEOUS | Status: DC
  Administered 2019-12-03 (×2): 70 [IU] via SUBCUTANEOUS
  Filled 2019-12-03: qty 10

## 2019-12-03 MED ORDER — HYDROCOD POLST-CPM POLST ER 10-8 MG/5ML PO SUER
5.0000 mL | Freq: Two times a day (BID) | ORAL | Status: DC | PRN
  Administered 2019-12-03 – 2019-12-05 (×4): 5 mL via ORAL
  Filled 2019-12-03 (×4): qty 5

## 2019-12-03 NOTE — Progress Notes (Signed)
PROGRESS NOTE [facility]  Shane Chambers  [id number]  DOB: [birth year]  DOA: 12/01/2019 PCP: [name], MD  Brief Admission Hx: 59 y.o. male with medical history significant of depression, type 2 diabetes, hyperlipidemia, hypertension who was admitted from 11/15/2019 until 11/17/2019 due to dyspnea and now returns with worsening dyspnea, productive cough associated with pleuritic chest pain, sore throat and wheezing.  He is Covid positive with pneumonia.    MDM/Assessment & Plan:   1. Covid pneumonia-patient remains ill.  He says that he does not feel well but his shortness of breath seems to be improving.  He still requires supplemental oxygen.  Continue remdesivir and steroids.  Continue to monitor inflammatory markers.  Continue supportive therapies.  Continue vitamins. 2. Severe vitamin D deficiency-he has been started on supplementation with high-dose vitamin D. 3. Type 2 diabetes mellitus with hyperglycemia, poorly controlled as evidenced by hemoglobin A1c of 9.8%-he has been restarted on 70/30 insulin and will likely need to adjust dose as needed.  Monitor CBG closely provide supplemental sliding scale coverage for elevated blood glucose readings.  We do anticipate some steroid-induced hyperglycemia and may need to adjust doses as needed.  Check CBG 5 times per day. 4. Hypokalemia-this has been repleted we will continue to follow closely. 5. Hyperlipidemia-continue atorvastatin daily. 6. Normocytic anemia-his iron levels are fairly low.  We will start some iron supplement.  Hemoccult stools. 7. BPH-continue tamsulosin daily.  DVT prophylaxis: Lovenox Code Status: Full Family Communication: Patient updated at bedside Disposition Plan: Continue inpatient management for IV therapies.  Consultants:    Procedures:    Antimicrobials:     Subjective: Patient still coughing and wheezing and does not feel well.   Objective: Vitals:   12/02/19 1952 12/02/19 2053  12/03/19 0523 12/03/19 1300  BP:  120/74 (!) 142/73 127/66  Pulse:  69 64 69  Resp:  17 19 18   Temp:  98.1 F (36.7 C) 98.3 F (36.8 C) 98 F (36.7 C)  TempSrc:    Oral  SpO2: 93% 94% 96% 95%  Weight:      Height:        Intake/Output Summary (Last 24 hours) at 12/03/2019 1532 Last data filed at 12/03/2019 1300 Gross per 24 hour  Intake 685.96 ml  Output 2900 ml  Net -2214.04 ml   Filed Weights   12/01/19 0040 12/01/19 2316  Weight: 131.6 kg (!) 137.2 kg   REVIEW OF SYSTEMS  As per history otherwise all reviewed and reported negative  Exam:  General exam: Chronically ill-appearing morbidly obese male he is lying in the bed he is awake and arousable in no apparent distress. Respiratory system:  No increased work of breathing. Cardiovascular system: S1 & S2 heard. No JVD, murmurs, gallops, clicks or pedal edema. Gastrointestinal system: Abdomen is nondistended, soft and nontender. Normal bowel sounds heard. Central nervous system: Alert and oriented. No focal neurological deficits. Extremities: no CCE.  Data Reviewed: Basic Metabolic Panel: Recent Labs  Lab 12/01/19 0148 12/02/19 0618 12/03/19 0641  NA 131*  --  131*  K 3.4*  --  3.9  CL 91*  --  92*  CO2 30  --  27  GLUCOSE 126*  --  264*  BUN 15  --  38*  CREATININE 0.87  --  1.09  CALCIUM 8.1*  --  8.9  MG  --   --  2.0  PHOS  --  2.9 4.1   Liver Function Tests: Recent Labs  Lab  12/01/19 0148 12/03/19 0641  AST 25 33  ALT 22 21  ALKPHOS 120 102  BILITOT 0.7 0.6  PROT 6.4* 6.9  ALBUMIN 3.1* 3.1*   No results for input(s): LIPASE, AMYLASE in the last 168 hours. No results for input(s): AMMONIA in the last 168 hours. CBC: Recent Labs  Lab 12/01/19 0148 12/02/19 0618 12/03/19 0641  WBC 10.0 13.4* 6.1  NEUTROABS 7.7 11.4* 4.6  HGB 12.0* 12.9* 12.4*  HCT 37.6* 40.5 39.0  MCV 87.0 86.2 86.9  PLT 206 238 227   Cardiac Enzymes: No results for input(s): CKTOTAL, CKMB, CKMBINDEX, TROPONINI in  the last 168 hours. CBG (last 3)  Recent Labs    12/03/19 0259 12/03/19 0728 12/03/19 1113  GLUCAP 330* 228* 307*   Recent Results (from the past 240 hour(s))  SARS CORONAVIRUS 2 (TAT 6-24 HRS) Nasopharyngeal Nasopharyngeal Swab     Status: Abnormal   Collection Time: 12/01/19  2:19 AM   Specimen: Nasopharyngeal Swab  Result Value Ref Range Status   SARS Coronavirus 2 POSITIVE (A) NEGATIVE Final    Comment: emailed L. [place] RN 15:35 12/01/19 ([email or web address]) (NOTE) SARS-CoV-2 target nucleic acids are DETECTED. The SARS-CoV-2 RNA is generally detectable in upper and lower respiratory specimens during the acute phase of infection. Positive results are indicative of the presence of SARS-CoV-2 RNA. Clinical correlation with patient history and other diagnostic information is  necessary to determine patient infection status. Positive results do not rule out bacterial infection or co-infection with other viruses.  The expected result is Negative. Fact Sheet for Patients: SugarRoll.be Fact Sheet for Healthcare Providers: https://www.woods-mathews.com/ This test is not yet approved or cleared by the [place] FDA and  has been authorized for detection and/or diagnosis of SARS-CoV-2 by FDA under an Emergency Use Authorization (EUA). This EUA will remain  in effect (meaning this test can be used) for the duration of the COVID-19 declaration und er Section 564(b)(1) of the Act, 21 U.S.C. section 360bbb-3(b)(1), unless the authorization is terminated or revoked sooner. Performed at [facility], [place] [address], [place], [place] [address]      Studies: No results found. Scheduled Meds: . amLODipine  10 mg Oral Daily  . vitamin C  500 mg Oral Daily  . aspirin EC  81 mg Oral Daily  . atorvastatin  40 mg Oral Daily  . dexamethasone  4 mg Oral Daily  . enoxaparin (LOVENOX) injection  65 mg Subcutaneous Q24H  . gabapentin  900 mg Oral BID  .  guaiFENesin  1,200 mg Oral BID  . insulin aspart  0-20 Units Subcutaneous TID WC  . insulin aspart protamine- aspart  70 Units Subcutaneous BID WC  . Ipratropium-Albuterol  1 puff Inhalation TID  . metoprolol  200 mg Oral Daily  . pantoprazole  40 mg Oral Daily  . tamsulosin  0.4 mg Oral Daily  . venlafaxine XR  150 mg Oral Q breakfast  . Vitamin D (Ergocalciferol)  50,000 Units Oral Once per day on Mon Thu  . zinc sulfate  220 mg Oral Daily   Continuous Infusions: . remdesivir 100 mg in NS 100 mL 100 mg (12/03/19 0923)    Principal Problem:   Pneumonia due to COVID-19 virus Active Problems:   Hypertension   Type 2 diabetes mellitus ([place])   CAP (community acquired pneumonia)   Hypokalemia   Hyperlipidemia   BPH (benign prostatic hyperplasia)   Normocytic anemia   Severe Vitamin D deficiency  Time spent:  Irwin Brakeman, MD [facility] 12/03/2019, 3:32 PM    LOS: 2 days  How to contact the [facility] Attending or Consulting provider [place] or covering provider during after hours [place], for this patient?  1. Check the care team in [facility] and look for a) attending/consulting TRH provider listed and b) the [facility] team listed 2. Log into [email or web address] and use [place]'s universal password to access. If you do not have the password, please contact the hospital operator. 3. Locate the [facility] provider you are looking for under [facility] and page to a number that you can be directly reached. 4. If you still have difficulty reaching the provider, please page the [facility] (Director on Call) for the Hospitalists listed on amion for assistance.

## 2019-12-04 DIAGNOSIS — D649 Anemia, unspecified: Secondary | ICD-10-CM

## 2019-12-04 LAB — CBC WITH DIFFERENTIAL/PLATELET
Abs Immature Granulocytes: 0.1 10*3/uL — ABNORMAL HIGH (ref 0.00–0.07)
Basophils Absolute: 0 10*3/uL (ref 0.0–0.1)
Basophils Relative: 0 %
Eosinophils Absolute: 0 10*3/uL (ref 0.0–0.5)
Eosinophils Relative: 0 %
HCT: 39.4 % (ref 39.0–52.0)
Hemoglobin: 12.7 g/dL — ABNORMAL LOW (ref 13.0–17.0)
Immature Granulocytes: 1 %
Lymphocytes Relative: 15 %
Lymphs Abs: 1.4 10*3/uL (ref 0.7–4.0)
MCH: 27.3 pg (ref 26.0–34.0)
MCHC: 32.2 g/dL (ref 30.0–36.0)
MCV: 84.7 fL (ref 80.0–100.0)
Monocytes Absolute: 0.9 10*3/uL (ref 0.1–1.0)
Monocytes Relative: 10 %
Neutro Abs: 6.9 10*3/uL (ref 1.7–7.7)
Neutrophils Relative %: 74 %
Platelets: 294 10*3/uL (ref 150–400)
RBC: 4.65 MIL/uL (ref 4.22–5.81)
RDW: 12.7 % (ref 11.5–15.5)
WBC: 9.4 10*3/uL (ref 4.0–10.5)
nRBC: 0 % (ref 0.0–0.2)

## 2019-12-04 LAB — COMPREHENSIVE METABOLIC PANEL
ALT: 23 U/L (ref 0–44)
AST: 32 U/L (ref 15–41)
Albumin: 3 g/dL — ABNORMAL LOW (ref 3.5–5.0)
Alkaline Phosphatase: 99 U/L (ref 38–126)
Anion gap: 13 (ref 5–15)
BUN: 40 mg/dL — ABNORMAL HIGH (ref 6–20)
CO2: 25 mmol/L (ref 22–32)
Calcium: 9.2 mg/dL (ref 8.9–10.3)
Chloride: 94 mmol/L — ABNORMAL LOW (ref 98–111)
Creatinine, Ser: 0.98 mg/dL (ref 0.61–1.24)
GFR calc Af Amer: >60 mL/min (ref >60)
GFR calc non Af Amer: >60 mL/min (ref >60)
Glucose, Bld: 219 mg/dL — ABNORMAL HIGH (ref 70–99)
Potassium: 4 mmol/L (ref 3.5–5.1)
Sodium: 132 mmol/L — ABNORMAL LOW (ref 135–145)
Total Bilirubin: 0.8 mg/dL (ref 0.3–1.2)
Total Protein: 6.9 g/dL (ref 6.5–8.1)

## 2019-12-04 LAB — GLUCOSE, CAPILLARY
Glucose-Capillary: 221 mg/dL — ABNORMAL HIGH (ref 70–99)
Glucose-Capillary: 185 mg/dL — ABNORMAL HIGH (ref 70–99)
Glucose-Capillary: 237 mg/dL — ABNORMAL HIGH (ref 70–99)
Glucose-Capillary: 216 mg/dL — ABNORMAL HIGH (ref 70–99)
Glucose-Capillary: 166 mg/dL — ABNORMAL HIGH (ref 70–99)

## 2019-12-04 LAB — D-DIMER, QUANTITATIVE: D-Dimer, Quant: 0.57 ug/mL-FEU — ABNORMAL HIGH (ref 0.00–0.50)

## 2019-12-04 LAB — PHOSPHORUS: Phosphorus: 3.7 mg/dL (ref 2.5–4.6)

## 2019-12-04 LAB — C-REACTIVE PROTEIN: CRP: 4.6 mg/dL — ABNORMAL HIGH (ref <1.0)

## 2019-12-04 LAB — MAGNESIUM: Magnesium: 1.9 mg/dL (ref 1.7–2.4)

## 2019-12-04 LAB — FERRITIN: Ferritin: 122 ng/mL (ref 24–336)

## 2019-12-04 MED ORDER — POLYETHYLENE GLYCOL 3350 17 G PO PACK
17.0000 g | PACK | Freq: Every day | ORAL | Status: DC
  Administered 2019-12-04 – 2019-12-06 (×3): 17 g via ORAL
  Filled 2019-12-04 (×3): qty 1

## 2019-12-04 MED ORDER — SENNOSIDES-DOCUSATE SODIUM 8.6-50 MG PO TABS
2.0000 | ORAL_TABLET | Freq: Every day | ORAL | Status: DC
  Administered 2019-12-04 – 2019-12-05 (×2): 2 via ORAL
  Filled 2019-12-04 (×2): qty 2

## 2019-12-04 MED ORDER — INSULIN ASPART PROT & ASPART (70-30 MIX) 100 UNIT/ML ~~LOC~~ SUSP
72.0000 [IU] | Freq: Two times a day (BID) | SUBCUTANEOUS | Status: DC
  Administered 2019-12-04 – 2019-12-05 (×4): 72 [IU] via SUBCUTANEOUS
  Filled 2019-12-04: qty 10

## 2019-12-04 NOTE — Progress Notes (Signed)
PROGRESS NOTE [facility]  Shane Chambers  [id number]  DOB: 1961/03/12  DOA: 12/01/2019 PCP: [name], MD  Brief Admission Hx: 59 y.o. male with medical history significant of depression, type 2 diabetes, hyperlipidemia, hypertension who was admitted from 11/15/2019 until 11/17/2019 due to dyspnea and now returns with worsening dyspnea, productive cough associated with pleuritic chest pain, sore throat and wheezing.  He is Covid positive with pneumonia.    MDM/Assessment & Plan:   1. Covid pneumonia-Has persistent nonproductive cough.   He says that his shortness of breath seems to be improving.  He has been weaning off supplemental oxygen.  Continue remdesivir and steroids.  inflammatory markers trending down showing improvement.  Continue supportive therapies.  Continue vitamins. 2. Severe vitamin D deficiency-he has been started on supplementation with high-dose vitamin D. 3. Type 2 diabetes mellitus with hyperglycemia, poorly controlled as evidenced by hemoglobin A1c of 9.8%-he has been restarted on 70/30 insulin and will likely need to adjust dose as needed.  Monitor CBG closely provide supplemental sliding scale coverage for elevated blood glucose readings.  He is having steroid-induced hyperglycemia and we have titrated up his insulin doses.  Check CBG 5 times per day. 4. Hypokalemia-this has been repleted we will continue to follow closely. 5. Hyperlipidemia-continue atorvastatin daily. 6. Normocytic anemia-his iron levels are fairly low.  We will start some iron supplement.  Hemoccult stools. 7. BPH-continue tamsulosin daily.  DVT prophylaxis: Lovenox Code Status: Full Family Communication: Patient updated at bedside Disposition Plan: Continue inpatient management for IV therapies.  Consultants:    Procedures:    Antimicrobials:     Subjective: Patient mostly complaining of persistent nagging nonproductive cough.   Objective: Vitals:   12/03/19 1915  12/03/19 2053 12/04/19 0400 12/04/19 0940  BP:  127/66 119/73   Pulse:  64 (!) 59 62  Resp:  20 19   Temp:  97.9 F (36.6 C) 97.9 F (36.6 C)   TempSrc:  Oral Oral   SpO2: 95% 99% 96%   Weight:      Height:        Intake/Output Summary (Last 24 hours) at 12/04/2019 1028 Last data filed at 12/04/2019 1000 Gross per 24 hour  Intake 480 ml  Output 2950 ml  Net -2470 ml   Filed Weights   12/01/19 0040 12/01/19 2316  Weight: 131.6 kg (!) 137.2 kg   REVIEW OF SYSTEMS  As per history otherwise all reviewed and reported negative  Exam:  General exam: Chronically ill-appearing morbidly obese male he is lying in the bed he is awake and arousable in no apparent distress.  Sitting up in bed.  Respiratory system:  Rare rales heard LLL.  No increased work of breathing. Cardiovascular system: S1 & S2 heard. No JVD, murmurs, gallops, clicks or pedal edema. Gastrointestinal system: Abdomen is nondistended, soft and nontender. Normal bowel sounds heard. Central nervous system: Alert and oriented. No focal neurological deficits. Extremities: no cyanosis or clubbing.   Data Reviewed: Basic Metabolic Panel: Recent Labs  Lab 12/01/19 0148 12/02/19 0618 12/03/19 0641 12/04/19 0512  NA 131*  --  131* 132*  K 3.4*  --  3.9 4.0  CL 91*  --  92* 94*  CO2 30  --  27 25  GLUCOSE 126*  --  264* 219*  BUN 15  --  38* 40*  CREATININE 0.87  --  1.09 0.98  CALCIUM 8.1*  --  8.9 9.2  MG  --   --  2.0 1.9  PHOS  --  2.9 4.1 3.7   Liver Function Tests: Recent Labs  Lab 12/01/19 0148 12/03/19 0641 12/04/19 0512  AST 25 33 32  ALT 22 21 23   ALKPHOS 120 102 99  BILITOT 0.7 0.6 0.8  PROT 6.4* 6.9 6.9  ALBUMIN 3.1* 3.1* 3.0*   No results for input(s): LIPASE, AMYLASE in the last 168 hours. No results for input(s): AMMONIA in the last 168 hours. CBC: Recent Labs  Lab 12/01/19 0148 12/02/19 0618 12/03/19 0641 12/04/19 0512  WBC 10.0 13.4* 6.1 9.4  NEUTROABS 7.7 11.4* 4.6 6.9  HGB  12.0* 12.9* 12.4* 12.7*  HCT 37.6* 40.5 39.0 39.4  MCV 87.0 86.2 86.9 84.7  PLT 206 238 227 294   Cardiac Enzymes: No results for input(s): CKTOTAL, CKMB, CKMBINDEX, TROPONINI in the last 168 hours. CBG (last 3)  Recent Labs    12/03/19 2048 12/04/19 0304 12/04/19 0739  GLUCAP 192* 221* 185*   Recent Results (from the past 240 hour(s))  SARS CORONAVIRUS 2 (TAT 6-24 HRS) Nasopharyngeal Nasopharyngeal Swab     Status: Abnormal   Collection Time: 12/01/19  2:19 AM   Specimen: Nasopharyngeal Swab  Result Value Ref Range Status   SARS Coronavirus 2 POSITIVE (A) NEGATIVE Final    Comment: emailed L. [place] RN 15:35 12/01/19 ([email or web address]) (NOTE) SARS-CoV-2 target nucleic acids are DETECTED. The SARS-CoV-2 RNA is generally detectable in upper and lower respiratory specimens during the acute phase of infection. Positive results are indicative of the presence of SARS-CoV-2 RNA. Clinical correlation with patient history and other diagnostic information is  necessary to determine patient infection status. Positive results do not rule out bacterial infection or co-infection with other viruses.  The expected result is Negative. Fact Sheet for Patients: SugarRoll.be Fact Sheet for Healthcare Providers: https://www.woods-mathews.com/ This test is not yet approved or cleared by the [place] FDA and  has been authorized for detection and/or diagnosis of SARS-CoV-2 by FDA under an Emergency Use Authorization (EUA). This EUA will remain  in effect (meaning this test can be used) for the duration of the COVID-19 declaration und er Section 564(b)(1) of the Act, 21 U.S.C. section 360bbb-3(b)(1), unless the authorization is terminated or revoked sooner. Performed at [facility], [place] [address], [place], [place] [address]      Studies: No results found. Scheduled Meds: . amLODipine  10 mg Oral Daily  . vitamin C  500 mg Oral Daily  . aspirin  EC  81 mg Oral Daily  . atorvastatin  40 mg Oral Daily  . dexamethasone  4 mg Oral Daily  . enoxaparin (LOVENOX) injection  65 mg Subcutaneous Q24H  . gabapentin  900 mg Oral BID  . guaiFENesin  1,200 mg Oral BID  . insulin aspart  0-20 Units Subcutaneous TID WC  . insulin aspart protamine- aspart  72 Units Subcutaneous BID WC  . Ipratropium-Albuterol  1 puff Inhalation TID  . metoprolol  200 mg Oral Daily  . pantoprazole  40 mg Oral Daily  . polyethylene glycol  17 g Oral Daily  . senna-docusate  2 tablet Oral QHS  . tamsulosin  0.4 mg Oral Daily  . venlafaxine XR  150 mg Oral Q breakfast  . Vitamin D (Ergocalciferol)  50,000 Units Oral Once per day on Mon Thu  . zinc sulfate  220 mg Oral Daily   Continuous Infusions: . remdesivir 100 mg in NS 100 mL 100 mg (12/04/19 [id number])    Principal Problem:  Pneumonia due to COVID-19 virus Active Problems:   Hypertension   Type 2 diabetes mellitus ([place])   CAP (community acquired pneumonia)   Hypokalemia   Hyperlipidemia   BPH (benign prostatic hyperplasia)   Normocytic anemia   Severe Vitamin D deficiency  Time spent:   Irwin Brakeman, MD [facility] 12/04/2019, 10:28 AM    LOS: 3 days  How to contact the [facility] Attending or Consulting provider [place] or covering provider during after hours [place], for this patient?  1. Check the care team in [facility] and look for a) attending/consulting TRH provider listed and b) the [facility] team listed 2. Log into [email or web address] and use [place]'s universal password to access. If you do not have the password, please contact the hospital operator. 3. Locate the [facility] provider you are looking for under [facility] and page to a number that you can be directly reached. 4. If you still have difficulty reaching the provider, please page the [facility] (Director on Call) for the Hospitalists listed on amion for assistance.

## 2019-12-04 NOTE — TOC Progression Note (Signed)
Transition of Care [facility]) - Progression Note    Patient Details  Name: Shane Chambers MRN: [id number] Date of Birth: [birth year]  Transition of Care [facility]) CM/SW Contact  Joniece Smotherman, Chauncey Reading, RN Phone Number: 12/04/2019, 1:25 PM  Clinical Narrative:   Patient much more alert today and able to talk. Discussed PT recommendation. Patient reports he lives alone, with help from his mom and sister in regards to cleaning and cooking. He does report having to rely on his mom heavily, stating she also empties his BSC.  He is adamant about not going to SNF. discussed home health PT, he is reluctant, being more amenable to OP PT. Stating reasons as his home "has problems". When asked to elaborate, he reports he has "floors that are giving away" or "has dips" in them and that there is not a lot of space to work with PT in his home.   Discussed other options for housing and possibly a family care home. Patient is not interested in moving and adamant about not going to a [facility], stating he doesn't like to live with other people.   Discussed with patient the need to think about his long term plans when he no longer has assistance from his family and is unable to take care of himself.   He wants to think about his options and let CM know if he will agree to home health vx outpatient PT.      Expected Discharge Plan: [place] Barriers to Discharge: Continued Medical Work up  Expected Discharge Plan and Services Expected Discharge Plan: [place]   Discharge Planning Services: CM Consult                                           Social Determinants of Health (SDOH) Interventions    Readmission Risk Interventions No flowsheet data found.

## 2019-12-04 NOTE — Plan of Care (Signed)
  Problem: Education: Goal: Knowledge of General Education information will improve Description: Including pain rating scale, medication(s)/side effects and non-pharmacologic comfort measures 12/04/2019 0956 by [name], RN Outcome: Progressing 12/04/2019 0956 by [name], RN Outcome: Progressing   Problem: Health Behavior/Discharge Planning: Goal: Ability to manage health-related needs will improve 12/04/2019 0956 by [name], RN Outcome: Progressing 12/04/2019 0956 by [name], RN Outcome: Progressing   Problem: Education: Goal: Knowledge of risk factors and measures for prevention of condition will improve Outcome: Progressing   Problem: Coping: Goal: Psychosocial and spiritual needs will be supported Outcome: Progressing   Problem: Respiratory: Goal: Will maintain a patent airway Outcome: Progressing

## 2019-12-05 LAB — GLUCOSE, CAPILLARY
Glucose-Capillary: 143 mg/dL — ABNORMAL HIGH (ref 70–99)
Glucose-Capillary: 146 mg/dL — ABNORMAL HIGH (ref 70–99)
Glucose-Capillary: 241 mg/dL — ABNORMAL HIGH (ref 70–99)
Glucose-Capillary: 168 mg/dL — ABNORMAL HIGH (ref 70–99)
Glucose-Capillary: 115 mg/dL — ABNORMAL HIGH (ref 70–99)

## 2019-12-05 LAB — COMPREHENSIVE METABOLIC PANEL
ALT: 23 U/L (ref 0–44)
AST: 27 U/L (ref 15–41)
Albumin: 2.9 g/dL — ABNORMAL LOW (ref 3.5–5.0)
Alkaline Phosphatase: 92 U/L (ref 38–126)
Anion gap: 12 (ref 5–15)
BUN: 32 mg/dL — ABNORMAL HIGH (ref 6–20)
CO2: 25 mmol/L (ref 22–32)
Calcium: 9.1 mg/dL (ref 8.9–10.3)
Chloride: 97 mmol/L — ABNORMAL LOW (ref 98–111)
Creatinine, Ser: 0.85 mg/dL (ref 0.61–1.24)
GFR calc Af Amer: >60 mL/min (ref >60)
GFR calc non Af Amer: >60 mL/min (ref >60)
Glucose, Bld: 152 mg/dL — ABNORMAL HIGH (ref 70–99)
Potassium: 3.7 mmol/L (ref 3.5–5.1)
Sodium: 134 mmol/L — ABNORMAL LOW (ref 135–145)
Total Bilirubin: 0.6 mg/dL (ref 0.3–1.2)
Total Protein: 6.6 g/dL (ref 6.5–8.1)

## 2019-12-05 LAB — CBC WITH DIFFERENTIAL/PLATELET
Abs Immature Granulocytes: 0.21 10*3/uL — ABNORMAL HIGH (ref 0.00–0.07)
Basophils Absolute: 0 10*3/uL (ref 0.0–0.1)
Basophils Relative: 0 %
Eosinophils Absolute: 0.1 10*3/uL (ref 0.0–0.5)
Eosinophils Relative: 1 %
HCT: 39.4 % (ref 39.0–52.0)
Hemoglobin: 12.6 g/dL — ABNORMAL LOW (ref 13.0–17.0)
Immature Granulocytes: 2 %
Lymphocytes Relative: 15 %
Lymphs Abs: 1.4 10*3/uL (ref 0.7–4.0)
MCH: 27.5 pg (ref 26.0–34.0)
MCHC: 32 g/dL (ref 30.0–36.0)
MCV: 86 fL (ref 80.0–100.0)
Monocytes Absolute: 0.8 10*3/uL (ref 0.1–1.0)
Monocytes Relative: 8 %
Neutro Abs: 7 10*3/uL (ref 1.7–7.7)
Neutrophils Relative %: 74 %
Platelets: 317 10*3/uL (ref 150–400)
RBC: 4.58 MIL/uL (ref 4.22–5.81)
RDW: 12.8 % (ref 11.5–15.5)
WBC: 9.7 10*3/uL (ref 4.0–10.5)
nRBC: 0 % (ref 0.0–0.2)

## 2019-12-05 LAB — D-DIMER, QUANTITATIVE: D-Dimer, Quant: 0.51 ug/mL-FEU — ABNORMAL HIGH (ref 0.00–0.50)

## 2019-12-05 LAB — C-REACTIVE PROTEIN: CRP: 2.1 mg/dL — ABNORMAL HIGH (ref <1.0)

## 2019-12-05 LAB — FERRITIN: Ferritin: 99 ng/mL (ref 24–336)

## 2019-12-05 LAB — PHOSPHORUS: Phosphorus: 3.3 mg/dL (ref 2.5–4.6)

## 2019-12-05 LAB — MAGNESIUM: Magnesium: 1.8 mg/dL (ref 1.7–2.4)

## 2019-12-05 MED ORDER — IPRATROPIUM-ALBUTEROL 20-100 MCG/ACT IN AERS: 1.0000 | INHALATION_SPRAY | Freq: Four times a day (QID) | RESPIRATORY_TRACT | Status: DC | PRN

## 2019-12-05 NOTE — Progress Notes (Signed)
Physical Therapy Treatment Patient Details Name: Shane Chambers MRN: [id number] DOB: 1961/07/11 Today's Date: 12/05/2019    History of Present Illness Shane Chambers is a 59 y.o. male with medical history significant of depression, type 2 diabetes, hyperlipidemia, hypertension who was admitted from 11/15/2019 until 11/17/2019 due to dyspnea and now returns with worsening dyspnea, productive cough associated with pleuritic chest pain, sore throat and wheezing.  He denies fever, chills, but feels tired and fatigued.  Denies hemoptysis, palpitations, dizziness, diaphoresis, PND, he occasionally gets lower extremity edema.  No abdominal pain, nausea, emesis, diarrhea, constipation.  No dysuria, frequency or hematuria.  Denies polyuria, polydipsia, polyphagia or blurred vision    PT Comments    Patient presents seated at bedside and agreeable for therapy.  Patient demonstrates good return for completing BLE ROM/strengthening exercises while seated at bedside, slow labored movement during ambulation in room while on room air with SpO2 maintaining at 93-95% and continued sitting up at bedside after therapy and left on room air - RN notified.  Patient will benefit from continued physical therapy in hospital and recommended venue below to increase strength, balance, endurance for safe ADLs and gait.   Follow Up Recommendations  SNF;Supervision for mobility/OOB;Supervision - Intermittent     Equipment Recommendations  Wheelchair (measurements PT);Wheelchair cushion (measurements PT)    Recommendations for Other Services       Precautions / Restrictions Precautions Precautions: Fall Restrictions Weight Bearing Restrictions: No    Mobility  Bed Mobility               General bed mobility comments: Patient presents seated at bedside  Transfers Overall transfer level: Needs assistance Equipment used: Rolling walker (2 wheeled) Transfers: Sit to/from [facility] Sit to  Stand: Min guard Stand pivot transfers: Min guard       General transfer comment: slow labored unsteady movement  Ambulation/Gait Ambulation/Gait assistance: Herbalist (Feet): 22 Feet Assistive device: Standard walker Gait Pattern/deviations: Decreased step length - right;Decreased step length - left;Decreased stride length;Staggering left;Staggering right Gait velocity: decreased   General Gait Details: slow labored cadence with occasional staggering right/left with near loss of balance, on room air with SpO2 at 93-95%, limited secondary to c/o fatigue   Stairs             Wheelchair Mobility    Modified Rankin (Stroke Patients Only)       Balance Overall balance assessment: Needs assistance Sitting-balance support: Feet supported;No upper extremity supported Sitting balance-Leahy Scale: Good Sitting balance - Comments: seated at EOB   Standing balance support: During functional activity;Bilateral upper extremity supported Standing balance-Leahy Scale: Fair Standing balance comment: fair using RW                            Cognition Arousal/Alertness: Awake/alert Behavior During Therapy: WFL for tasks assessed/performed Overall Cognitive Status: Within Functional Limits for tasks assessed                                        Exercises General Exercises - Lower Extremity Long Arc Quad: Seated;AROM;Strengthening;Both;10 reps Hip Flexion/Marching: Seated;AROM;Strengthening;Both;10 reps Toe Raises: Seated;AROM;Strengthening;Both;10 reps Heel Raises: Seated;AROM;Strengthening;Both;10 reps    General Comments        Pertinent Vitals/Pain Pain Assessment: No/denies pain    Home Living  Prior Function            PT Goals (current goals can now be found in the care plan section) Acute Rehab PT Goals Patient Stated Goal: return home PT Goal Formulation: With patient Time For Goal  Achievement: 12/16/19 Potential to Achieve Goals: Fair Progress towards PT goals: Progressing toward goals    Frequency    Min 3X/week      PT Plan Current plan remains appropriate    Co-evaluation              AM-PAC PT "6 Clicks" Mobility   Outcome Measure  Help needed turning from your back to your side while in a flat bed without using bedrails?: None Help needed moving from lying on your back to sitting on the side of a flat bed without using bedrails?: A Little Help needed moving to and from a bed to a chair (including a wheelchair)?: A Little Help needed standing up from a chair using your arms (e.g., wheelchair or bedside chair)?: A Little Help needed to walk in hospital room?: A Lot Help needed climbing 3-5 steps with a railing? : A Lot 6 Click Score: 17    End of Session   Activity Tolerance: Patient tolerated treatment well;Patient limited by fatigue Patient left: in bed;with call bell/phone within reach(seated at bedside) Nurse Communication: Mobility status PT Visit Diagnosis: Unsteadiness on feet (R26.81);Other abnormalities of gait and mobility (R26.89);Muscle weakness (generalized) (M62.81)     Time: [id number] PT Time Calculation (min) (ACUTE ONLY): 28 min  Charges:  $Therapeutic Exercise: 8-22 mins $Therapeutic Activity: 8-22 mins                     3:58 PM, 12/05/19 Lonell Grandchild, MPT Physical Therapist with [facility] [phone] office [phone] mobile phone

## 2019-12-05 NOTE — Progress Notes (Signed)
Patient seems to be in good spirits this afternoon. He walked with Jeneen Rinks (PT) and is now on room air.

## 2019-12-05 NOTE — Progress Notes (Signed)
PROGRESS NOTE [facility]  Shane Chambers  [id number]  DOB: [birth year]  DOA: 12/01/2019 PCP: [name], MD  Brief Admission Hx: 59 y.o. male with medical history significant of depression, type 2 diabetes, hyperlipidemia, hypertension who was admitted from 11/15/2019 until 11/17/2019 due to dyspnea and now returns with worsening dyspnea, productive cough associated with pleuritic chest pain, sore throat and wheezing.  He is Covid positive with pneumonia.    MDM/Assessment & Plan:   1. Covid pneumonia-Has persistent nonproductive cough.   He says that his shortness of breath seems to be improving.  He has been weaning off supplemental oxygen.  Continue remdesivir and steroids.  inflammatory markers trending down showing improvement.  Continue supportive therapies.  Continue vitamins. 2. Severe vitamin D deficiency-he has been started on supplementation with high-dose vitamin D. 3. Type 2 diabetes mellitus with hyperglycemia, poorly controlled as evidenced by hemoglobin A1c of 9.8%-he has been restarted on 70/30 insulin and will likely need to adjust dose as needed.  Monitor CBG closely provide supplemental sliding scale coverage for elevated blood glucose readings.  He is having steroid-induced hyperglycemia and we have titrated up his insulin doses.  Check CBG 5 times per day. 4. Hypokalemia-this has been repleted we will continue to follow closely. 5. Hyperlipidemia-continue atorvastatin daily. 6. Normocytic anemia-his iron levels are fairly low.  We will start some iron supplement.  Hemoccult stools. 7. BPH-continue tamsulosin daily.  DVT prophylaxis: Lovenox Code Status: Full Family Communication: Patient updated at bedside Disposition Plan: Continue inpatient management for IV therapies.  Anticipate DC home tomorrow.  Consultants:    Procedures:    Antimicrobials:     Subjective: Patient says cough is getting better.   Objective: Vitals:   12/04/19 2131  12/05/19 0536 12/05/19 1146 12/05/19 1359  BP: 135/77 (!) 144/78  129/75  Pulse: (!) 57 (!) 58 62 63  Resp: 20 20  20   Temp: (!) 97.5 F (36.4 C) 97.6 F (36.4 C)  (!) 97.5 F (36.4 C)  TempSrc: Oral Oral  Oral  SpO2: 93% (!) 88% 90% 98%  Weight:      Height:        Intake/Output Summary (Last 24 hours) at 12/05/2019 1553 Last data filed at 12/05/2019 1500 Gross per 24 hour  Intake 720 ml  Output 4000 ml  Net -3280 ml   Filed Weights   12/01/19 0040 12/01/19 2316  Weight: 131.6 kg (!) 137.2 kg   REVIEW OF SYSTEMS  As per history otherwise all reviewed and reported negative  Exam:  General exam: Chronically ill-appearing morbidly obese male he is lying in the bed he is awake and arousable in no apparent distress.  Sitting up in bed.  Respiratory system:  BBS Clear to auscultation.  No increased work of breathing. Cardiovascular system: normal S1 & S2 heard. No JVD, murmurs, gallops, clicks or pedal edema. Gastrointestinal system: Abdomen is nondistended, soft and nontender. Normal bowel sounds heard. Central nervous system: Alert and oriented. No focal neurological deficits. Extremities: no cyanosis or clubbing.   Data Reviewed: Basic Metabolic Panel: Recent Labs  Lab 12/01/19 0148 12/02/19 0618 12/03/19 0641 12/04/19 0512 12/05/19 0438  NA 131*  --  131* 132* 134*  K 3.4*  --  3.9 4.0 3.7  CL 91*  --  92* 94* 97*  CO2 30  --  27 25 25   GLUCOSE 126*  --  264* 219* 152*  BUN 15  --  38* 40* 32*  CREATININE 0.87  --  1.09 0.98 0.85  CALCIUM 8.1*  --  8.9 9.2 9.1  MG  --   --  2.0 1.9 1.8  PHOS  --  2.9 4.1 3.7 3.3   Liver Function Tests: Recent Labs  Lab 12/01/19 0148 12/03/19 0641 12/04/19 0512 12/05/19 0438  AST 25 33 32 27  ALT 22 21 23 23   ALKPHOS 120 102 99 92  BILITOT 0.7 0.6 0.8 0.6  PROT 6.4* 6.9 6.9 6.6  ALBUMIN 3.1* 3.1* 3.0* 2.9*   No results for input(s): LIPASE, AMYLASE in the last 168 hours. No results for input(s): AMMONIA in the last  168 hours. CBC: Recent Labs  Lab 12/01/19 0148 12/02/19 0618 12/03/19 0641 12/04/19 0512 12/05/19 0438  WBC 10.0 13.4* 6.1 9.4 9.7  NEUTROABS 7.7 11.4* 4.6 6.9 7.0  HGB 12.0* 12.9* 12.4* 12.7* 12.6*  HCT 37.6* 40.5 39.0 39.4 39.4  MCV 87.0 86.2 86.9 84.7 86.0  PLT 206 238 227 294 317   Cardiac Enzymes: No results for input(s): CKTOTAL, CKMB, CKMBINDEX, TROPONINI in the last 168 hours. CBG (last 3)  Recent Labs    12/05/19 0257 12/05/19 0737 12/05/19 1105  GLUCAP 143* 146* 241*   Recent Results (from the past 240 hour(s))  SARS CORONAVIRUS 2 (TAT 6-24 HRS) Nasopharyngeal Nasopharyngeal Swab     Status: Abnormal   Collection Time: 12/01/19  2:19 AM   Specimen: Nasopharyngeal Swab  Result Value Ref Range Status   SARS Coronavirus 2 POSITIVE (A) NEGATIVE Final    Comment: emailed L. [place] RN 15:35 12/01/19 (wilsonm) (NOTE) SARS-CoV-2 target nucleic acids are DETECTED. The SARS-CoV-2 RNA is generally detectable in upper and lower respiratory specimens during the acute phase of infection. Positive results are indicative of the presence of SARS-CoV-2 RNA. Clinical correlation with patient history and other diagnostic information is  necessary to determine patient infection status. Positive results do not rule out bacterial infection or co-infection with other viruses.  The expected result is Negative. Fact Sheet for Patients: SugarRoll.be Fact Sheet for Healthcare Providers: https://www.woods-mathews.com/ This test is not yet approved or cleared by the [place] FDA and  has been authorized for detection and/or diagnosis of SARS-CoV-2 by FDA under an Emergency Use Authorization (EUA). This EUA will remain  in effect (meaning this test can be used) for the duration of the COVID-19 declaration und er Section 564(b)(1) of the Act, 21 U.S.C. section 360bbb-3(b)(1), unless the authorization is terminated or revoked sooner. Performed  at [facility], [place] [address], [place], [place] [address]      Studies: No results found. Scheduled Meds: . amLODipine  10 mg Oral Daily  . vitamin C  500 mg Oral Daily  . aspirin EC  81 mg Oral Daily  . atorvastatin  40 mg Oral Daily  . dexamethasone  4 mg Oral Daily  . enoxaparin (LOVENOX) injection  65 mg Subcutaneous Q24H  . gabapentin  900 mg Oral BID  . guaiFENesin  1,200 mg Oral BID  . insulin aspart  0-20 Units Subcutaneous TID WC  . insulin aspart protamine- aspart  72 Units Subcutaneous BID WC  . metoprolol  200 mg Oral Daily  . pantoprazole  40 mg Oral Daily  . polyethylene glycol  17 g Oral Daily  . senna-docusate  2 tablet Oral QHS  . tamsulosin  0.4 mg Oral Daily  . venlafaxine XR  150 mg Oral Q breakfast  . Vitamin D (Ergocalciferol)  50,000 Units Oral Once per day on Mon Thu  .  zinc sulfate  220 mg Oral Daily   Continuous Infusions:  Principal Problem:   Pneumonia due to COVID-19 virus Active Problems:   Hypertension   Type 2 diabetes mellitus ([facility])   CAP (community acquired pneumonia)   Hypokalemia   Hyperlipidemia   BPH (benign prostatic hyperplasia)   Normocytic anemia   Severe Vitamin D deficiency  Time spent:   Irwin Brakeman, MD [facility] 12/05/2019, 3:53 PM    LOS: 4 days  How to contact the [facility] Attending or Consulting provider [place] or covering provider during after hours [place], for this patient?  1. Check the care team in [facility] and look for a) attending/consulting TRH provider listed and b) the [facility] team listed 2. Log into [email or web address] and use [place]'s universal password to access. If you do not have the password, please contact the hospital operator. 3. Locate the [facility] provider you are looking for under [facility] and page to a number that you can be directly reached. 4. If you still have difficulty reaching the provider, please page the [facility] (Director on Call) for the Hospitalists listed on amion for  assistance.

## 2019-12-06 LAB — COMPREHENSIVE METABOLIC PANEL
ALT: 22 U/L (ref 0–44)
AST: 27 U/L (ref 15–41)
Albumin: 3 g/dL — ABNORMAL LOW (ref 3.5–5.0)
Alkaline Phosphatase: 96 U/L (ref 38–126)
Anion gap: 10 (ref 5–15)
BUN: 32 mg/dL — ABNORMAL HIGH (ref 6–20)
CO2: 26 mmol/L (ref 22–32)
Calcium: 9.1 mg/dL (ref 8.9–10.3)
Chloride: 99 mmol/L (ref 98–111)
Creatinine, Ser: 0.81 mg/dL (ref 0.61–1.24)
GFR calc Af Amer: >60 mL/min (ref >60)
GFR calc non Af Amer: >60 mL/min (ref >60)
Glucose, Bld: 96 mg/dL (ref 70–99)
Potassium: 3.8 mmol/L (ref 3.5–5.1)
Sodium: 135 mmol/L (ref 135–145)
Total Bilirubin: 0.6 mg/dL (ref 0.3–1.2)
Total Protein: 6.6 g/dL (ref 6.5–8.1)

## 2019-12-06 LAB — CBC WITH DIFFERENTIAL/PLATELET
Abs Immature Granulocytes: 0.8 10*3/uL — ABNORMAL HIGH (ref 0.00–0.07)
Basophils Absolute: 0.1 10*3/uL (ref 0.0–0.1)
Basophils Relative: 1 %
Eosinophils Absolute: 0 10*3/uL (ref 0.0–0.5)
Eosinophils Relative: 0 %
HCT: 40.1 % (ref 39.0–52.0)
Hemoglobin: 12.8 g/dL — ABNORMAL LOW (ref 13.0–17.0)
Immature Granulocytes: 5 %
Lymphocytes Relative: 17 %
Lymphs Abs: 2.5 10*3/uL (ref 0.7–4.0)
MCH: 27.2 pg (ref 26.0–34.0)
MCHC: 31.9 g/dL (ref 30.0–36.0)
MCV: 85.3 fL (ref 80.0–100.0)
Monocytes Absolute: 1.4 10*3/uL — ABNORMAL HIGH (ref 0.1–1.0)
Monocytes Relative: 9 %
Neutro Abs: 10 10*3/uL — ABNORMAL HIGH (ref 1.7–7.7)
Neutrophils Relative %: 68 %
Platelets: 390 10*3/uL (ref 150–400)
RBC: 4.7 MIL/uL (ref 4.22–5.81)
RDW: 12.9 % (ref 11.5–15.5)
WBC: 14.8 10*3/uL — ABNORMAL HIGH (ref 4.0–10.5)
nRBC: 0 % (ref 0.0–0.2)

## 2019-12-06 LAB — D-DIMER, QUANTITATIVE: D-Dimer, Quant: 0.75 ug/mL-FEU — ABNORMAL HIGH (ref 0.00–0.50)

## 2019-12-06 LAB — GLUCOSE, CAPILLARY
Glucose-Capillary: 109 mg/dL — ABNORMAL HIGH (ref 70–99)
Glucose-Capillary: 89 mg/dL (ref 70–99)
Glucose-Capillary: 204 mg/dL — ABNORMAL HIGH (ref 70–99)

## 2019-12-06 LAB — MAGNESIUM: Magnesium: 1.8 mg/dL (ref 1.7–2.4)

## 2019-12-06 LAB — C-REACTIVE PROTEIN: CRP: 1 mg/dL — ABNORMAL HIGH (ref <1.0)

## 2019-12-06 LAB — FERRITIN: Ferritin: 74 ng/mL (ref 24–336)

## 2019-12-06 LAB — PHOSPHORUS: Phosphorus: 3.1 mg/dL (ref 2.5–4.6)

## 2019-12-06 MED ORDER — IRON (FERROUS SULFATE) 325 (65 FE) MG PO TABS: 1.0000 | ORAL_TABLET | Freq: Every day | ORAL | 1 refills | Status: DC

## 2019-12-06 MED ORDER — ZINC SULFATE 220 (50 ZN) MG PO CAPS: 220.0000 mg | ORAL_CAPSULE | Freq: Every day | ORAL | 0 refills | Status: DC

## 2019-12-06 MED ORDER — VITAMIN D (ERGOCALCIFEROL) 1.25 MG (50000 UNIT) PO CAPS: 50000.0000 [IU] | ORAL_CAPSULE | ORAL | 0 refills | Status: AC

## 2019-12-06 MED ORDER — HYDROCOD POLST-CPM POLST ER 10-8 MG/5ML PO SUER: 5.0000 mL | Freq: Two times a day (BID) | ORAL | 0 refills | Status: DC | PRN

## 2019-12-06 MED ORDER — DEXAMETHASONE 4 MG PO TABS: 4.0000 mg | ORAL_TABLET | Freq: Every day | ORAL | 0 refills | Status: AC

## 2019-12-06 MED ORDER — ASCORBIC ACID 500 MG PO TABS: 500.0000 mg | ORAL_TABLET | Freq: Every day | ORAL | 0 refills | Status: DC

## 2019-12-06 MED ORDER — GUAIFENESIN ER 600 MG PO TB12: 1200.0000 mg | ORAL_TABLET | Freq: Two times a day (BID) | ORAL | 0 refills | Status: AC

## 2019-12-06 NOTE — Discharge Summary (Signed)
Physician Discharge Summary  Shane Chambers [id number] DOB: 02-19-1961 DOA: 12/01/2019  PCP: [name], MD  Admit date: 12/01/2019 Discharge date: 12/06/2019  Admitted From:  Home  Disposition:  Home (Declines SNF and [facility])  Recommendations for Outpatient Follow-up:  1. Follow up with PCP in 1-2 weeks  Home Health: Pt declines SNF and HH but agreeable to outpatient PT  Discharge Condition: STABLE   CODE STATUS: FULL    Brief Hospitalization Summary: Please see all hospital notes, images, labs for full details of the hospitalization. ADMISSION OB:[id number] Shane Chambers a 59 y.o.malewith medical history significant ofdepression, type 2 diabetes, hyperlipidemia, hypertension who was admitted from 11/15/2019 until 11/17/2019 due to dyspnea and now returns with worsening dyspnea, productive cough associated with pleuritic chest pain, sore throat and wheezing. He denies fever, chills, but feels tired and fatigued. Denies hemoptysis, palpitations, dizziness, diaphoresis, PND, he occasionally gets lower extremity edema. No abdominal pain, nausea, emesis, diarrhea, constipation. No dysuria, frequency or hematuria. Denies polyuria, polydipsia, polyphagia or blurred vision  ED Course:Initial vital signs temperature 98.2 F, pulse 73, respirations 24, blood pressure 156/70 mmHg and O2 sat 89% on room air. On 2 LPM via  O2 sat is in the mid to high 90s. He received bronchodilators, Levaquin 750 mg IVPBand 125 mg of Solu-Medrol.  White count was 10.0, hemoglobin 12.0 g/dL and platelets 206. Lactic acid was normal. Troponin was 64 then 55 ng/L. BNP was normal. Sodium 131, potassium 3.4, chloride 91 and CO2 30 mmol/L. His glucose 126 and calcium 8.1 mg/dL. Calcium normalizes when corrected to an albumin of 3.1 g/dL. His chest radiograph shows minimal patchy bibasilar opacities which are new from previous film.  Brief Admission Hx: 59 y.o.malewith medical history significant  ofdepression, type 2 diabetes, hyperlipidemia, hypertension who was admitted from 11/15/2019 until 11/17/2019 due to dyspnea and now returns with worsening dyspnea, productive cough associated with pleuritic chest pain, sore throat and wheezing. He is Covid positive with pneumonia.   MDM/Assessment & Plan:  1. Covid pneumonia-He is much improved now. He has ambulated in room. Wean to room air today and discharge home. He has completed remdesivir and will discharge on 5 more days of decadron. inflammatory markers trending down showing improvement.  Continue vitamins. 2. Severe vitamin D deficiency-he has been started on supplementation with high-dose vitamin D.  Follow up with PCP.  3. Type 2 diabetes mellitus with hyperglycemia, poorly controlled as evidenced by hemoglobin A1c of 9.8%-he has been restarted on 70/30 insulin and resuming home oral meds. Monitor CBG closely and follow up with PCP.  4. Hypokalemia-this has been repleted we will continue to follow closely. 5. Hyperlipidemia-continue atorvastatin daily. 6. Normocytic anemia-his iron levels are fairly low. We will start some iron supplement. Follow up with PCP for referral to GI for TCS. 7. BPH-continue tamsulosin daily.  DVT prophylaxis:Lovenox Code Status:Full Family Communication:Patient updated at bedside, pt preferred to update wife himself Disposition Plan:Home with outpatient PT.  Discharge Diagnoses:  Principal Problem:   Pneumonia due to COVID-19 virus Active Problems:   Hypertension   Type 2 diabetes mellitus ([place])   CAP (community acquired pneumonia)   Hypokalemia   Hyperlipidemia   BPH (benign prostatic hyperplasia)   Normocytic anemia   Severe Vitamin D deficiency   Discharge Instructions: Discharge Instructions    Ambulatory referral to Physical Therapy   Complete by: As directed    MyChart COVID-19 home monitoring program   Complete by: Dec 06, 2019    Is the patient  willing to use the  [facility] for home monitoring?: Yes   Temperature monitoring   Complete by: Dec 06, 2019    After how many days would you like to receive a notification of this patient's flowsheet entries?: 1     Allergies as of 12/06/2019      Reactions   Ace Inhibitors Swelling, Cough   Other Itching   [name]   Penicillins    Has patient had a PCN reaction causing immediate rash, facial/tongue/throat swelling, SOB or lightheadedness with hypotension: Unknown Has patient had a PCN reaction causing severe rash involving mucus membranes or skin necrosis: Unknown Has patient had a PCN reaction that required hospitalization: Unknown Has patient had a PCN reaction occurring within the last 10 years: Unknown If all of the above answers are "NO", then may proceed with Cephalosporin use.      Medication List    STOP taking these medications   chlorthalidone 25 MG tablet Commonly known as: HYGROTON   DAYQUIL MULTI-SYMPTOM COLD/FLU PO     TAKE these medications   albuterol 108 (90 Base) MCG/ACT inhaler Commonly known as: VENTOLIN HFA Inhale 1-2 puffs into the lungs every 6 (six) hours as needed for wheezing or shortness of breath.   amLODipine 10 MG tablet Commonly known as: NORVASC Take 10 mg by mouth daily.   ascorbic acid 500 MG tablet Commonly known as: VITAMIN C Take 1 tablet (500 mg total) by mouth daily for 14 days.   aspirin 81 MG EC tablet Take 81 mg by mouth daily.   atorvastatin 40 MG tablet Commonly known as: LIPITOR Take 40 mg by mouth daily.   chlorpheniramine-HYDROcodone 10-8 MG/5ML Suer Commonly known as: TUSSIONEX Take 5 mLs by mouth every 12 (twelve) hours as needed for cough.   dexamethasone 4 MG tablet Commonly known as: DECADRON Take 1 tablet (4 mg total) by mouth daily for 5 days.   diclofenac Sodium 1 % Gel Commonly known as: VOLTAREN Apply 2 g topically daily as needed (for pain).   fluticasone 27.5 MCG/SPRAY nasal spray Commonly known as:  VERAMYST Place 2 sprays into the nose daily as needed for rhinitis or allergies.   gabapentin 300 MG capsule Commonly known as: NEURONTIN Take 900 mg by mouth 2 (two) times daily.   guaiFENesin 600 MG 12 hr tablet Commonly known as: MUCINEX Take 2 tablets (1,200 mg total) by mouth 2 (two) times daily for 7 days.   Iron (Ferrous Sulfate) 325 (65 Fe) MG Tabs Take 1 tablet by mouth daily.   metFORMIN 1000 MG tablet Commonly known as: GLUCOPHAGE Take 1,000 mg by mouth 2 (two) times daily with a meal.   metoprolol 200 MG 24 hr tablet Commonly known as: TOPROL-XL Take 200 mg by mouth daily.   NovoLOG Mix 70/30 (70-30) 100 UNIT/ML injection Generic drug: insulin aspart protamine- aspart Inject 65 Units into the skin 2 (two) times daily with a meal.   omeprazole 40 MG capsule Commonly known as: PRILOSEC Take 40 mg by mouth daily.   Tab-A-Vite Tabs Take 1 tablet by mouth daily.   tamsulosin 0.4 MG Caps capsule Commonly known as: FLOMAX Take 0.4 mg by mouth daily.   venlafaxine XR 150 MG 24 hr capsule Commonly known as: EFFEXOR-XR Take 150 mg by mouth daily with breakfast.   Vitamin D (Ergocalciferol) 1.25 MG (50000 UNIT) Caps capsule Commonly known as: DRISDOL Take 1 capsule (50,000 Units total) by mouth 2 (two) times a week. Start taking on: December 08, 2019   zinc sulfate 220 (50 Zn) MG capsule Take 1 capsule (220 mg total) by mouth daily for 14 days.            Durable Medical Equipment  (From admission, onward)         Start     Ordered   12/06/19 1235  For home use only DME high strength lightweight manual wheelchair with seat cushion  Once    Comments: Patient suffers from Obesity, hypoxia which impairs their ability to perform daily activities like ADL's in the home.  A walking aide will not resolve  issue with performing activities of daily living. A wheelchair will allow patient to safely perform daily activities.Length of need 99 month. Accessories:  wheel locks, extensions and anti-tippers.   12/06/19 [place]. Call.   Specialty: Rehabilitation Contact information: [place] [id number] [place] [id number] [place]. Call.   Why:  Wheelchair          Allergies  Allergen Reactions  . Ace Inhibitors Swelling and Cough  . Other Itching    [name]  . Penicillins     Has patient had a PCN reaction causing immediate rash, facial/tongue/throat swelling, SOB or lightheadedness with hypotension: Unknown Has patient had a PCN reaction causing severe rash involving mucus membranes or skin necrosis: Unknown Has patient had a PCN reaction that required hospitalization: Unknown Has patient had a PCN reaction occurring within the last 10 years: Unknown If all of the above answers are "NO", then may proceed with Cephalosporin use.    Allergies as of 12/06/2019      Reactions   Ace Inhibitors Swelling, Cough   Other Itching   [name]   Penicillins    Has patient had a PCN reaction causing immediate rash, facial/tongue/throat swelling, SOB or lightheadedness with hypotension: Unknown Has patient had a PCN reaction causing severe rash involving mucus membranes or skin necrosis: Unknown Has patient had a PCN reaction that required hospitalization: Unknown Has patient had a PCN reaction occurring within the last 10 years: Unknown If all of the above answers are "NO", then may proceed with Cephalosporin use.      Medication List    STOP taking these medications   chlorthalidone 25 MG tablet Commonly known as: HYGROTON   DAYQUIL MULTI-SYMPTOM COLD/FLU PO     TAKE these medications   albuterol 108 (90 Base) MCG/ACT inhaler Commonly known as: VENTOLIN HFA Inhale 1-2 puffs into the lungs every 6 (six) hours as needed for wheezing or shortness of breath.   amLODipine 10 MG tablet Commonly  known as: NORVASC Take 10 mg by mouth daily.   ascorbic acid 500 MG tablet Commonly known as: VITAMIN C Take 1 tablet (500 mg total) by mouth daily for 14 days.   aspirin 81 MG EC tablet Take 81 mg by mouth daily.   atorvastatin 40 MG tablet Commonly known as: LIPITOR Take 40 mg by mouth daily.   chlorpheniramine-HYDROcodone 10-8 MG/5ML Suer Commonly known as: TUSSIONEX Take 5 mLs by mouth every 12 (twelve) hours as needed for cough.   dexamethasone 4 MG tablet Commonly known as: DECADRON Take 1 tablet (4 mg total) by mouth daily for 5 days.   diclofenac Sodium 1 % Gel Commonly known as: VOLTAREN Apply 2 g topically daily as needed (for pain).  fluticasone 27.5 MCG/SPRAY nasal spray Commonly known as: VERAMYST Place 2 sprays into the nose daily as needed for rhinitis or allergies.   gabapentin 300 MG capsule Commonly known as: NEURONTIN Take 900 mg by mouth 2 (two) times daily.   guaiFENesin 600 MG 12 hr tablet Commonly known as: MUCINEX Take 2 tablets (1,200 mg total) by mouth 2 (two) times daily for 7 days.   Iron (Ferrous Sulfate) 325 (65 Fe) MG Tabs Take 1 tablet by mouth daily.   metFORMIN 1000 MG tablet Commonly known as: GLUCOPHAGE Take 1,000 mg by mouth 2 (two) times daily with a meal.   metoprolol 200 MG 24 hr tablet Commonly known as: TOPROL-XL Take 200 mg by mouth daily.   NovoLOG Mix 70/30 (70-30) 100 UNIT/ML injection Generic drug: insulin aspart protamine- aspart Inject 65 Units into the skin 2 (two) times daily with a meal.   omeprazole 40 MG capsule Commonly known as: PRILOSEC Take 40 mg by mouth daily.   Tab-A-Vite Tabs Take 1 tablet by mouth daily.   tamsulosin 0.4 MG Caps capsule Commonly known as: FLOMAX Take 0.4 mg by mouth daily.   venlafaxine XR 150 MG 24 hr capsule Commonly known as: EFFEXOR-XR Take 150 mg by mouth daily with breakfast.   Vitamin D (Ergocalciferol) 1.25 MG (50000 UNIT) Caps capsule Commonly known as:  DRISDOL Take 1 capsule (50,000 Units total) by mouth 2 (two) times a week. Start taking on: December 08, 2019   zinc sulfate 220 (50 Zn) MG capsule Take 1 capsule (220 mg total) by mouth daily for 14 days.            Durable Medical Equipment  (From admission, onward)         Start     Ordered   12/06/19 1235  For home use only DME high strength lightweight manual wheelchair with seat cushion  Once    Comments: Patient suffers from Obesity, hypoxia which impairs their ability to perform daily activities like ADL's in the home.  A walking aide will not resolve  issue with performing activities of daily living. A wheelchair will allow patient to safely perform daily activities.Length of need 99 month. Accessories: wheel locks, extensions and anti-tippers.   12/06/19 1247          Procedures/Studies: DG Chest Port 1 View  Result Date: 12/01/2019 CLINICAL DATA:  Cough and shortness of breath, worsened over the past week and a half. EXAM: PORTABLE CHEST 1 VIEW COMPARISON:  11/15/2019 FINDINGS: Unchanged elevation of right hemidiaphragm. Minimal patchy opacities in the basilar periphery of both lungs, new from prior. Unchanged heart size and mediastinal contours. No pneumothorax, large pleural effusion or pulmonary edema. No acute osseous abnormalities. IMPRESSION: Minimal patchy bibasilar opacities, new from prior, may be atelectasis or pneumonia. Electronically Signed   By: Keith Rake M.D.   On: 12/01/2019 01:29   DG Chest Portable 1 View  Result Date: 11/15/2019 CLINICAL DATA:  Chest pain, weakness and shortness of breath. EXAM: PORTABLE CHEST 1 VIEW COMPARISON:  PA and lateral chest 12/11/2017. FINDINGS: Eventration of the right hemidiaphragm is unchanged. Lungs clear. Heart size normal. No pneumothorax or pleural effusion. No acute or focal bony abnormality. IMPRESSION: Negative chest. Electronically Signed   By: Inge Rise M.D.   On: 11/15/2019 12:01       Subjective: Pt says he feels a lot better.  He has no SOB.  He declines SNF and HOME HEALTH but agreeable to outpatient PT.  Discharge Exam:  Vitals:   12/05/19 2037 12/05/19 2114  BP:  127/71  Pulse:  60  Resp:  20  Temp:  98.3 F (36.8 C)  SpO2: 98% 93%   Vitals:   12/05/19 1146 12/05/19 1359 12/05/19 2037 12/05/19 2114  BP:  129/75  127/71  Pulse: 62 63  60  Resp:  20  20  Temp:  (!) 97.5 F (36.4 C)  98.3 F (36.8 C)  TempSrc:  Oral  Oral  SpO2: 90% 98% 98% 93%  Weight:      Height:        General: Pt is alert, awake, not in acute distress Cardiovascular: RRR, S1/S2 +, no rubs, no gallops Respiratory: CTA bilaterally, no wheezing, no rhonchi Abdominal: Soft, NT, ND, bowel sounds + Extremities: no edema, no cyanosis   The results of significant diagnostics from this hospitalization (including imaging, microbiology, ancillary and laboratory) are listed below for reference.     Microbiology: Recent Results (from the past 240 hour(s))  SARS CORONAVIRUS 2 (TAT 6-24 HRS) Nasopharyngeal Nasopharyngeal Swab     Status: Abnormal   Collection Time: 12/01/19  2:19 AM   Specimen: Nasopharyngeal Swab  Result Value Ref Range Status   SARS Coronavirus 2 POSITIVE (A) NEGATIVE Final    Comment: emailed L. [place] RN 15:35 12/01/19 ([email or web address]) (NOTE) SARS-CoV-2 target nucleic acids are DETECTED. The SARS-CoV-2 RNA is generally detectable in upper and lower respiratory specimens during the acute phase of infection. Positive results are indicative of the presence of SARS-CoV-2 RNA. Clinical correlation with patient history and other diagnostic information is  necessary to determine patient infection status. Positive results do not rule out bacterial infection or co-infection with other viruses.  The expected result is Negative. Fact Sheet for Patients: SugarRoll.be Fact Sheet for Healthcare Providers: https://www.woods-mathews.com/ This  test is not yet approved or cleared by the [place] FDA and  has been authorized for detection and/or diagnosis of SARS-CoV-2 by FDA under an Emergency Use Authorization (EUA). This EUA will remain  in effect (meaning this test can be used) for the duration of the COVID-19 declaration und er Section 564(Shane)(1) of the Act, 21 U.S.C. section 360bbb-3(Shane)(1), unless the authorization is terminated or revoked sooner. Performed at [facility], [place] [address] [place], [place] [address]      Labs: BNP (last 3 results) Recent Labs    11/15/19 1520 12/01/19 0148 12/01/19 1846  BNP 134.0* 93.0 [id number]*   Basic Metabolic Panel: Recent Labs  Lab 12/01/19 0148 12/02/19 0618 12/03/19 0641 12/04/19 0512 12/05/19 0438 12/06/19 0623  NA 131*  --  131* 132* 134* 135  K 3.4*  --  3.9 4.0 3.7 3.8  CL 91*  --  92* 94* 97* 99  CO2 30  --  27 25 25 26   GLUCOSE 126*  --  264* 219* 152* 96  BUN 15  --  38* 40* 32* 32*  CREATININE 0.87  --  1.09 0.98 0.85 0.81  CALCIUM 8.1*  --  8.9 9.2 9.1 9.1  MG  --   --  2.0 1.9 1.8 1.8  PHOS  --  2.9 4.1 3.7 3.3 3.1   Liver Function Tests: Recent Labs  Lab 12/01/19 0148 12/03/19 0641 12/04/19 0512 12/05/19 0438 12/06/19 0623  AST 25 33 32 27 27  ALT 22 21 23 23 22   ALKPHOS 120 102 99 92 96  BILITOT 0.7 0.6 0.8 0.6 0.6  PROT 6.4* 6.9 6.9 6.6 6.6  ALBUMIN 3.1* 3.1* 3.0*  2.9* 3.0*   No results for input(s): LIPASE, AMYLASE in the last 168 hours. No results for input(s): AMMONIA in the last 168 hours. CBC: Recent Labs  Lab 12/02/19 0618 12/03/19 0641 12/04/19 0512 12/05/19 0438 12/06/19 0623  WBC 13.4* 6.1 9.4 9.7 14.8*  NEUTROABS 11.4* 4.6 6.9 7.0 10.0*  HGB 12.9* 12.4* 12.7* 12.6* 12.8*  HCT 40.5 39.0 39.4 39.4 40.1  MCV 86.2 86.9 84.7 86.0 85.3  PLT 238 227 294 317 390   Cardiac Enzymes: No results for input(s): CKTOTAL, CKMB, CKMBINDEX, TROPONINI in the last 168 hours. BNP: Invalid input(s): POCBNP CBG: Recent Labs   Lab 12/05/19 1615 12/05/19 2022 12/06/19 0302 12/06/19 0747 12/06/19 1058  GLUCAP 168* 115* 109* 89 204*   D-Dimer Recent Labs    12/05/19 0438 12/06/19 0623  DDIMER 0.51* 0.75*   Hgb A1c No results for input(s): HGBA1C in the last 72 hours. Lipid Profile No results for input(s): CHOL, HDL, LDLCALC, TRIG, CHOLHDL, LDLDIRECT in the last 72 hours. Thyroid function studies No results for input(s): TSH, T4TOTAL, T3FREE, THYROIDAB in the last 72 hours.  Invalid input(s): FREET3 Anemia work up Recent Labs    12/05/19 0438 12/06/19 0623  FERRITIN 99 74   Urinalysis    Component Value Date/Time   COLORURINE STRAW (A) 11/15/2019 2337   APPEARANCEUR CLEAR 11/15/2019 2337   LABSPEC 1.008 11/15/2019 2337   PHURINE 7.0 11/15/2019 2337   GLUCOSEU NEGATIVE 11/15/2019 2337   HGBUR MODERATE (A) 11/15/2019 2337   [place] NEGATIVE 11/15/2019 [place] 11/15/2019 2337   PROTEINUR 100 (A) 11/15/2019 2337   NITRITE NEGATIVE 11/15/2019 2337   LEUKOCYTESUR NEGATIVE 11/15/2019 2337   Sepsis Labs Invalid input(s): PROCALCITONIN,  WBC,  LACTICIDVEN Microbiology Recent Results (from the past 240 hour(s))  SARS CORONAVIRUS 2 (TAT 6-24 HRS) Nasopharyngeal Nasopharyngeal Swab     Status: Abnormal   Collection Time: 12/01/19  2:19 AM   Specimen: Nasopharyngeal Swab  Result Value Ref Range Status   SARS Coronavirus 2 POSITIVE (A) NEGATIVE Final    Comment: emailed L. [place] RN 15:35 12/01/19 (wilsonm) (NOTE) SARS-CoV-2 target nucleic acids are DETECTED. The SARS-CoV-2 RNA is generally detectable in upper and lower respiratory specimens during the acute phase of infection. Positive results are indicative of the presence of SARS-CoV-2 RNA. Clinical correlation with patient history and other diagnostic information is  necessary to determine patient infection status. Positive results do not rule out bacterial infection or co-infection with other viruses.  The expected  result is Negative. Fact Sheet for Patients: SugarRoll.be Fact Sheet for Healthcare Providers: https://www.woods-mathews.com/ This test is not yet approved or cleared by the [place] FDA and  has been authorized for detection and/or diagnosis of SARS-CoV-2 by FDA under an Emergency Use Authorization (EUA). This EUA will remain  in effect (meaning this test can be used) for the duration of the COVID-19 declaration und er Section 564(Shane)(1) of the Act, 21 U.S.C. section 360bbb-3(Shane)(1), unless the authorization is terminated or revoked sooner. Performed at [facility], [place] [address] [place], [place] [address]     Time coordinating discharge: 35 minutes   SIGNED:  Irwin Brakeman, MD  [facility] 12/06/2019, 3:29 PM How to contact the [facility] Attending or Consulting provider [place] or covering provider during after hours [place], for this patient?  1. Check the care team in [facility] and look for a) attending/consulting TR[facility] provider listed and Shane) the [facility] team listed 2. Log into [email or web address] and  use 's universal password to access. If you do not have the password, please contact the hospital operator. 3. Locate the [facility] provider you are looking for under [facility] and page to a number that you can be directly reached. 4. If you still have difficulty reaching the provider, please page the [facility] (Director on Call) for the Hospitalists listed on amion for assistance.

## 2019-12-06 NOTE — TOC Transition Note (Addendum)
Transition of Care [facility]) - CM/SW Discharge Note   Patient Details  Name: Shane Chambers MRN: [id number] Date of Birth: 1961/07/30  [facility] of [facility] CM/SW Contact:  Boneta Lucks, RN Phone Number: 12/06/2019, 11:28 AM   Clinical Narrative:   Patient is ready for discharge home, patient is requesting outpatient PT, MD ordered. Patient did not get his wheelchair from [employer] last hospital stay.  [employer] has been aware, CM called and left Melissa a message that patient was discharging home today.   Addendum: Melissa called back, she has the order and they will ship to his home, confirmed address.    Barriers to Discharge: Barriers Resolved   Readmission Risk Interventions Readmission Risk Prevention Plan 12/06/2019  Transportation Screening Complete  PCP or Specialist Appt within 3-5 Days Not Complete  HRI or [place] Not Complete  Social Work Consult for [place] Planning/Counseling Not Complete  Palliative Care Screening Not Complete  Medication Review Press photographer) Complete  Some recent data might be hidden

## 2019-12-06 NOTE — Progress Notes (Signed)
IV, tele removed. D/C instructions reviewed. Patient verbalized understanding. Patient transported to private vehicle via wheelchair.

## 2019-12-06 NOTE — Progress Notes (Signed)
Physician Discharge Summary  Shane Chambers [id number] DOB: 04/16/1961 DOA: 12/01/2019  PCP: [name], MD  Admit date: 12/01/2019 Discharge date: 12/06/2019  Admitted From:  Home  Disposition:  Home (Declines SNF and [facility])  Recommendations for Outpatient Follow-up:  1. Follow up with PCP in 1-2 weeks  Home Health: Pt declines SNF and HH but agreeable to outpatient PT  Discharge Condition: STABLE   CODE STATUS: FULL    Brief Hospitalization Summary: Please see all hospital notes, images, labs for full details of the hospitalization. ADMISSION HPI: Shane Chambers is a 59 y.o. male with medical history significant of depression, type 2 diabetes, hyperlipidemia, hypertension who was admitted from 11/15/2019 until 11/17/2019 due to dyspnea and now returns with worsening dyspnea, productive cough associated with pleuritic chest pain, sore throat and wheezing.  He denies fever, chills, but feels tired and fatigued.  Denies hemoptysis, palpitations, dizziness, diaphoresis, PND, he occasionally gets lower extremity edema.  No abdominal pain, nausea, emesis, diarrhea, constipation.  No dysuria, frequency or hematuria.  Denies polyuria, polydipsia, polyphagia or blurred vision  ED Course: Initial vital signs temperature 98.2 F, pulse 73, respirations 24, blood pressure 156/70 mmHg and O2 sat 89% on room air.  On 2 LPM via [place] O2 sat is in the mid to high 90s.  He received bronchodilators, Levaquin 750 mg IVPB and 125 mg of Solu-Medrol.  White count was 10.0, hemoglobin 12.0 g/dL and platelets 206.  Lactic acid was normal.  Troponin was 64 then 55 ng/L.  BNP was normal.  Sodium 131, potassium 3.4, chloride 91 and CO2 30 mmol/L.  His glucose 126 and calcium 8.1 mg/dL.  Calcium normalizes when corrected to an albumin of 3.1 g/dL.  His chest radiograph shows minimal patchy bibasilar opacities which are new from previous film.  Brief Admission Hx: 58 y.o.malewith medical history significant  ofdepression, type 2 diabetes, hyperlipidemia, hypertension who was admitted from 11/15/2019 until 11/17/2019 due to dyspnea and now returns with worsening dyspnea, productive cough associated with pleuritic chest pain, sore throat and wheezing.  He is Covid positive with pneumonia.    MDM/Assessment & Plan:   1. Covid pneumonia-He is much improved now.   He has ambulated in room. Wean to room air today and discharge home. He has completed remdesivir and will discharge on 5 more days of decadron.  inflammatory markers trending down showing improvement.    Continue vitamins. 2. Severe vitamin D deficiency-he has been started on supplementation with high-dose vitamin D.  Follow up with PCP.  3. Type 2 diabetes mellitus with hyperglycemia, poorly controlled as evidenced by hemoglobin A1c of 9.8%-he has been restarted on 70/30 insulin and resuming home oral meds.  Monitor CBG closely and follow up with PCP.  4. Hypokalemia-this has been repleted we will continue to follow closely. 5. Hyperlipidemia-continue atorvastatin daily. 6. Normocytic anemia-his iron levels are fairly low.  We will start some iron supplement.  Follow up with PCP for referral to GI for TCS. 7. BPH-continue tamsulosin daily.  DVT prophylaxis: Lovenox Code Status: Full Family Communication: Patient updated at bedside, pt preferred to update wife himself Disposition Plan: Home with outpatient PT.  Discharge Diagnoses:  Principal Problem:   Pneumonia due to COVID-19 virus Active Problems:   Hypertension   Type 2 diabetes mellitus ([place])   CAP (community acquired pneumonia)   Hypokalemia   Hyperlipidemia   BPH (benign prostatic hyperplasia)   Normocytic anemia   Severe Vitamin D deficiency   Discharge Instructions: Discharge  Instructions    Ambulatory referral to Physical Therapy   Complete by: As directed    MyChart COVID-19 home monitoring program   Complete by: Dec 06, 2019    Is the patient willing to use the  [place] for home monitoring?: Yes   Temperature monitoring   Complete by: Dec 06, 2019    After how many days would you like to receive a notification of this patient's flowsheet entries?: 1     Allergies as of 12/06/2019      Reactions   Ace Inhibitors Swelling, Cough   Other Itching   [name]   Penicillins    Has patient had a PCN reaction causing immediate rash, facial/tongue/throat swelling, SOB or lightheadedness with hypotension: Unknown Has patient had a PCN reaction causing severe rash involving mucus membranes or skin necrosis: Unknown Has patient had a PCN reaction that required hospitalization: Unknown Has patient had a PCN reaction occurring within the last 10 years: Unknown If all of the above answers are "NO", then may proceed with Cephalosporin use.      Medication List    STOP taking these medications   chlorthalidone 25 MG tablet Commonly known as: HYGROTON   DAYQUIL MULTI-SYMPTOM COLD/FLU PO     TAKE these medications   albuterol 108 (90 Base) MCG/ACT inhaler Commonly known as: VENTOLIN HFA Inhale 1-2 puffs into the lungs every 6 (six) hours as needed for wheezing or shortness of breath.   amLODipine 10 MG tablet Commonly known as: NORVASC Take 10 mg by mouth daily.   ascorbic acid 500 MG tablet Commonly known as: VITAMIN C Take 1 tablet (500 mg total) by mouth daily for 14 days.   aspirin 81 MG EC tablet Take 81 mg by mouth daily.   atorvastatin 40 MG tablet Commonly known as: LIPITOR Take 40 mg by mouth daily.   chlorpheniramine-HYDROcodone 10-8 MG/5ML Suer Commonly known as: TUSSIONEX Take 5 mLs by mouth every 12 (twelve) hours as needed for cough.   dexamethasone 4 MG tablet Commonly known as: DECADRON Take 1 tablet (4 mg total) by mouth daily for 5 days.   diclofenac Sodium 1 % Gel Commonly known as: VOLTAREN Apply 2 g topically daily as needed (for pain).   fluticasone 27.5 MCG/SPRAY nasal spray Commonly known as:  VERAMYST Place 2 sprays into the nose daily as needed for rhinitis or allergies.   gabapentin 300 MG capsule Commonly known as: NEURONTIN Take 900 mg by mouth 2 (two) times daily.   guaiFENesin 600 MG 12 hr tablet Commonly known as: MUCINEX Take 2 tablets (1,200 mg total) by mouth 2 (two) times daily for 7 days.   Iron (Ferrous Sulfate) 325 (65 Fe) MG Tabs Take 1 tablet by mouth daily.   metFORMIN 1000 MG tablet Commonly known as: GLUCOPHAGE Take 1,000 mg by mouth 2 (two) times daily with a meal.   metoprolol 200 MG 24 hr tablet Commonly known as: TOPROL-XL Take 200 mg by mouth daily.   NovoLOG Mix 70/30 (70-30) 100 UNIT/ML injection Generic drug: insulin aspart protamine- aspart Inject 65 Units into the skin 2 (two) times daily with a meal.   omeprazole 40 MG capsule Commonly known as: PRILOSEC Take 40 mg by mouth daily.   Tab-A-Vite Tabs Take 1 tablet by mouth daily.   tamsulosin 0.4 MG Caps capsule Commonly known as: FLOMAX Take 0.4 mg by mouth daily.   venlafaxine XR 150 MG 24 hr capsule Commonly known as: EFFEXOR-XR Take 150 mg by  mouth daily with breakfast.   Vitamin D (Ergocalciferol) 1.25 MG (50000 UNIT) Caps capsule Commonly known as: DRISDOL Take 1 capsule (50,000 Units total) by mouth 2 (two) times a week. Start taking on: December 08, 2019   zinc sulfate 220 (50 Zn) MG capsule Take 1 capsule (220 mg total) by mouth daily for 14 days.       Allergies  Allergen Reactions  . Ace Inhibitors Swelling and Cough  . Other Itching    [name]  . Penicillins     Has patient had a PCN reaction causing immediate rash, facial/tongue/throat swelling, SOB or lightheadedness with hypotension: Unknown Has patient had a PCN reaction causing severe rash involving mucus membranes or skin necrosis: Unknown Has patient had a PCN reaction that required hospitalization: Unknown Has patient had a PCN reaction occurring within the last 10 years: Unknown If all of the  above answers are "NO", then may proceed with Cephalosporin use.    Allergies as of 12/06/2019      Reactions   Ace Inhibitors Swelling, Cough   Other Itching   [name]   Penicillins    Has patient had a PCN reaction causing immediate rash, facial/tongue/throat swelling, SOB or lightheadedness with hypotension: Unknown Has patient had a PCN reaction causing severe rash involving mucus membranes or skin necrosis: Unknown Has patient had a PCN reaction that required hospitalization: Unknown Has patient had a PCN reaction occurring within the last 10 years: Unknown If all of the above answers are "NO", then may proceed with Cephalosporin use.      Medication List    STOP taking these medications   chlorthalidone 25 MG tablet Commonly known as: HYGROTON   DAYQUIL MULTI-SYMPTOM COLD/FLU PO     TAKE these medications   albuterol 108 (90 Base) MCG/ACT inhaler Commonly known as: VENTOLIN HFA Inhale 1-2 puffs into the lungs every 6 (six) hours as needed for wheezing or shortness of breath.   amLODipine 10 MG tablet Commonly known as: NORVASC Take 10 mg by mouth daily.   ascorbic acid 500 MG tablet Commonly known as: VITAMIN C Take 1 tablet (500 mg total) by mouth daily for 14 days.   aspirin 81 MG EC tablet Take 81 mg by mouth daily.   atorvastatin 40 MG tablet Commonly known as: LIPITOR Take 40 mg by mouth daily.   chlorpheniramine-HYDROcodone 10-8 MG/5ML Suer Commonly known as: TUSSIONEX Take 5 mLs by mouth every 12 (twelve) hours as needed for cough.   dexamethasone 4 MG tablet Commonly known as: DECADRON Take 1 tablet (4 mg total) by mouth daily for 5 days.   diclofenac Sodium 1 % Gel Commonly known as: VOLTAREN Apply 2 g topically daily as needed (for pain).   fluticasone 27.5 MCG/SPRAY nasal spray Commonly known as: VERAMYST Place 2 sprays into the nose daily as needed for rhinitis or allergies.   gabapentin 300 MG capsule Commonly known as:  NEURONTIN Take 900 mg by mouth 2 (two) times daily.   guaiFENesin 600 MG 12 hr tablet Commonly known as: MUCINEX Take 2 tablets (1,200 mg total) by mouth 2 (two) times daily for 7 days.   Iron (Ferrous Sulfate) 325 (65 Fe) MG Tabs Take 1 tablet by mouth daily.   metFORMIN 1000 MG tablet Commonly known as: GLUCOPHAGE Take 1,000 mg by mouth 2 (two) times daily with a meal.   metoprolol 200 MG 24 hr tablet Commonly known as: TOPROL-XL Take 200 mg by mouth daily.   NovoLOG Mix 70/30 (  70-30) 100 UNIT/ML injection Generic drug: insulin aspart protamine- aspart Inject 65 Units into the skin 2 (two) times daily with a meal.   omeprazole 40 MG capsule Commonly known as: PRILOSEC Take 40 mg by mouth daily.   Tab-A-Vite Tabs Take 1 tablet by mouth daily.   tamsulosin 0.4 MG Caps capsule Commonly known as: FLOMAX Take 0.4 mg by mouth daily.   venlafaxine XR 150 MG 24 hr capsule Commonly known as: EFFEXOR-XR Take 150 mg by mouth daily with breakfast.   Vitamin D (Ergocalciferol) 1.25 MG (50000 UNIT) Caps capsule Commonly known as: DRISDOL Take 1 capsule (50,000 Units total) by mouth 2 (two) times a week. Start taking on: December 08, 2019   zinc sulfate 220 (50 Zn) MG capsule Take 1 capsule (220 mg total) by mouth daily for 14 days.       Procedures/Studies: DG Chest Port 1 View  Result Date: 12/01/2019 CLINICAL DATA:  Cough and shortness of breath, worsened over the past week and a half. EXAM: PORTABLE CHEST 1 VIEW COMPARISON:  11/15/2019 FINDINGS: Unchanged elevation of right hemidiaphragm. Minimal patchy opacities in the basilar periphery of both lungs, new from prior. Unchanged heart size and mediastinal contours. No pneumothorax, large pleural effusion or pulmonary edema. No acute osseous abnormalities. IMPRESSION: Minimal patchy bibasilar opacities, new from prior, may be atelectasis or pneumonia. Electronically Signed   By: Keith Rake M.D.   On: 12/01/2019 01:29    DG Chest Portable 1 View  Result Date: 11/15/2019 CLINICAL DATA:  Chest pain, weakness and shortness of breath. EXAM: PORTABLE CHEST 1 VIEW COMPARISON:  PA and lateral chest 12/11/2017. FINDINGS: Eventration of the right hemidiaphragm is unchanged. Lungs clear. Heart size normal. No pneumothorax or pleural effusion. No acute or focal bony abnormality. IMPRESSION: Negative chest. Electronically Signed   By: Inge Rise M.D.   On: 11/15/2019 12:01     Subjective: Pt says he feels a lot better.  He has no SOB.  He declines SNF and HOME HEALTH but agreeable to outpatient PT.  Discharge Exam: Vitals:   12/05/19 2037 12/05/19 2114  BP:  127/71  Pulse:  60  Resp:  20  Temp:  98.3 F (36.8 C)  SpO2: 98% 93%   Vitals:   12/05/19 1146 12/05/19 1359 12/05/19 2037 12/05/19 2114  BP:  129/75  127/71  Pulse: 62 63  60  Resp:  20  20  Temp:  (!) 97.5 F (36.4 C)  98.3 F (36.8 C)  TempSrc:  Oral  Oral  SpO2: 90% 98% 98% 93%  Weight:      Height:       General: Pt is alert, awake, not in acute distress Cardiovascular: RRR, S1/S2 +, no rubs, no gallops Respiratory: CTA bilaterally, no wheezing, no rhonchi Abdominal: Soft, NT, ND, bowel sounds + Extremities: no edema, no cyanosis   The results of significant diagnostics from this hospitalization (including imaging, microbiology, ancillary and laboratory) are listed below for reference.     Microbiology: Recent Results (from the past 240 hour(s))  SARS CORONAVIRUS 2 (TAT 6-24 HRS) Nasopharyngeal Nasopharyngeal Swab     Status: Abnormal   Collection Time: 12/01/19  2:19 AM   Specimen: Nasopharyngeal Swab  Result Value Ref Range Status   SARS Coronavirus 2 POSITIVE (A) NEGATIVE Final    Comment: emailed L. Staples RN 15:35 12/01/19 ([email or web address]) (NOTE) SARS-CoV-2 target nucleic acids are DETECTED. The SARS-CoV-2 RNA is generally detectable in upper and lower respiratory specimens during the acute phase  of infection. Positive results  are indicative of the presence of SARS-CoV-2 RNA. Clinical correlation with patient history and other diagnostic information is  necessary to determine patient infection status. Positive results do not rule out bacterial infection or co-infection with other viruses.  The expected result is Negative. Fact Sheet for Patients: SugarRoll.be Fact Sheet for Healthcare Providers: https://www.woods-mathews.com/ This test is not yet approved or cleared by the [place] FDA and  has been authorized for detection and/or diagnosis of SARS-CoV-2 by FDA under an Emergency Use Authorization (EUA). This EUA will remain  in effect (meaning this test can be used) for the duration of the COVID-19 declaration und er Section 564(b)(1) of the Act, 21 U.S.C. section 360bbb-3(b)(1), unless the authorization is terminated or revoked sooner. Performed at [facility], [place] [address], [place], [place] [address]      Labs: BNP (last 3 results) Recent Labs    11/15/19 1520 12/01/19 0148 12/01/19 1846  BNP 134.0* 93.0 [id number]*   Basic Metabolic Panel: Recent Labs  Lab 12/01/19 0148 12/02/19 0618 12/03/19 0641 12/04/19 0512 12/05/19 0438 12/06/19 0623  NA 131*  --  131* 132* 134* 135  K 3.4*  --  3.9 4.0 3.7 3.8  CL 91*  --  92* 94* 97* 99  CO2 30  --  27 25 25 26   GLUCOSE 126*  --  264* 219* 152* 96  BUN 15  --  38* 40* 32* 32*  CREATININE 0.87  --  1.09 0.98 0.85 0.81  CALCIUM 8.1*  --  8.9 9.2 9.1 9.1  MG  --   --  2.0 1.9 1.8 1.8  PHOS  --  2.9 4.1 3.7 3.3 3.1   Liver Function Tests: Recent Labs  Lab 12/01/19 0148 12/03/19 0641 12/04/19 0512 12/05/19 0438 12/06/19 0623  AST 25 33 32 27 27  ALT 22 21 23 23 22   ALKPHOS 120 102 99 92 96  BILITOT 0.7 0.6 0.8 0.6 0.6  PROT 6.4* 6.9 6.9 6.6 6.6  ALBUMIN 3.1* 3.1* 3.0* 2.9* 3.0*   No results for input(s): LIPASE, AMYLASE in the last 168 hours. No results for input(s): AMMONIA in the last  168 hours. CBC: Recent Labs  Lab 12/02/19 0618 12/03/19 0641 12/04/19 0512 12/05/19 0438 12/06/19 0623  WBC 13.4* 6.1 9.4 9.7 14.8*  NEUTROABS 11.4* 4.6 6.9 7.0 10.0*  HGB 12.9* 12.4* 12.7* 12.6* 12.8*  HCT 40.5 39.0 39.4 39.4 40.1  MCV 86.2 86.9 84.7 86.0 85.3  PLT 238 227 294 317 390   Cardiac Enzymes: No results for input(s): CKTOTAL, CKMB, CKMBINDEX, TROPONINI in the last 168 hours. BNP: Invalid input(s): POCBNP CBG: Recent Labs  Lab 12/05/19 1105 12/05/19 1615 12/05/19 2022 12/06/19 0302 12/06/19 0747  GLUCAP 241* 168* 115* 109* 89   D-Dimer Recent Labs    12/05/19 0438 12/06/19 0623  DDIMER 0.51* 0.75*   Hgb A1c No results for input(s): HGBA1C in the last 72 hours. Lipid Profile No results for input(s): CHOL, HDL, LDLCALC, TRIG, CHOLHDL, LDLDIRECT in the last 72 hours. Thyroid function studies No results for input(s): TSH, T4TOTAL, T3FREE, THYROIDAB in the last 72 hours.  Invalid input(s): FREET3 Anemia work up Recent Labs    12/05/19 0438 12/06/19 0623  FERRITIN 99 74   Urinalysis    Component Value Date/Time   COLORURINE STRAW (A) 11/15/2019 2337   APPEARANCEUR CLEAR 11/15/2019 2337   LABSPEC 1.008 11/15/2019 2337   PHURINE 7.0 11/15/2019 2337   GLUCOSEU NEGATIVE 11/15/2019  [place] (A) 11/15/2019 2337   [place] 11/15/2019 [place] 11/15/2019 2337   PROTEINUR 100 (A) 11/15/2019 2337   NITRITE NEGATIVE 11/15/2019 2337   LEUKOCYTESUR NEGATIVE 11/15/2019 2337   Sepsis Labs Invalid input(s): PROCALCITONIN,  WBC,  LACTICIDVEN Microbiology Recent Results (from the past 240 hour(s))  SARS CORONAVIRUS 2 (TAT 6-24 HRS) Nasopharyngeal Nasopharyngeal Swab     Status: Abnormal   Collection Time: 12/01/19  2:19 AM   Specimen: Nasopharyngeal Swab  Result Value Ref Range Status   SARS Coronavirus 2 POSITIVE (A) NEGATIVE Final    Comment: emailed L. [place] RN 15:35 12/01/19 (wilsonm) (NOTE) SARS-CoV-2  target nucleic acids are DETECTED. The SARS-CoV-2 RNA is generally detectable in upper and lower respiratory specimens during the acute phase of infection. Positive results are indicative of the presence of SARS-CoV-2 RNA. Clinical correlation with patient history and other diagnostic information is  necessary to determine patient infection status. Positive results do not rule out bacterial infection or co-infection with other viruses.  The expected result is Negative. Fact Sheet for Patients: SugarRoll.be Fact Sheet for Healthcare Providers: https://www.woods-mathews.com/ This test is not yet approved or cleared by the [place] FDA and  has been authorized for detection and/or diagnosis of SARS-CoV-2 by FDA under an Emergency Use Authorization (EUA). This EUA will remain  in effect (meaning this test can be used) for the duration of the COVID-19 declaration und er Section 564(b)(1) of the Act, 21 U.S.C. section 360bbb-3(b)(1), unless the authorization is terminated or revoked sooner. Performed at [facility], [place] [address], [place], [place] [address]     Time coordinating discharge: 35 minutes   SIGNED:  Irwin Brakeman, MD  [facility] 12/06/2019, 9:20 AM How to contact the [facility] Attending or Consulting provider [place] or covering provider during after hours [place], for this patient?  1. Check the care team in [facility] and look for a) attending/consulting [facility] provider listed and b) the [facility] team listed 2. Log into [email or web address] and use [place]'s universal password to access. If you do not have the password, please contact the hospital operator. 3. Locate the [facility] provider you are looking for under [facility] and page to a number that you can be directly reached. 4. If you still have difficulty reaching the provider, please page the [facility] (Director on Call) for the Hospitalists listed on amion for assistance.

## 2019-12-12 ENCOUNTER — Telehealth (HOSPITAL_COMMUNITY): Payer: Self-pay | Admitting: Physical Therapy

## 2019-12-12 NOTE — Telephone Encounter (Signed)
pt was in the hospital for 4 days with COVID, he came home last Tuesday, he still feels bad and still needs to complete his 14 quarantine. will reschedule his appt.

## 2019-12-13 ENCOUNTER — Ambulatory Visit (HOSPITAL_COMMUNITY): Payer: Medicaid Other | Admitting: Physical Therapy

## 2019-12-14 NOTE — Care Management (Signed)
WC note: Patient suffers from obesity and hypoxia which impairs their ability to perform daily activities like ambulating in the home.  A walker will not resolve issue with performing activities of daily living. A wheelchair will allow patient to safely perform daily activities. Patient can safely propel the wheelchair in the home or has a caregiver who can provide assistance.

## 2019-12-20 ENCOUNTER — Emergency Department (HOSPITAL_COMMUNITY): Payer: Medicaid Other

## 2019-12-20 ENCOUNTER — Other Ambulatory Visit: Payer: Self-pay

## 2019-12-20 ENCOUNTER — Inpatient Hospital Stay (HOSPITAL_COMMUNITY)
Admission: EM | Admit: 2019-12-20 | Discharge: 2019-12-25 | DRG: 189 | Disposition: A | Discharge status: Home / Self Care | Payer: Medicaid Other | Attending: Internal Medicine | Admitting: Internal Medicine

## 2019-12-20 ENCOUNTER — Encounter (HOSPITAL_COMMUNITY): Payer: Self-pay | Admitting: Emergency Medicine

## 2019-12-20 DIAGNOSIS — Z8249 Family history of ischemic heart disease and other diseases of the circulatory system: Secondary | ICD-10-CM

## 2019-12-20 DIAGNOSIS — F32A Depression, unspecified: Secondary | ICD-10-CM | POA: Diagnosis present

## 2019-12-20 DIAGNOSIS — E11649 Type 2 diabetes mellitus with hypoglycemia without coma: Secondary | ICD-10-CM | POA: Diagnosis not present

## 2019-12-20 DIAGNOSIS — R0902 Hypoxemia: Secondary | ICD-10-CM

## 2019-12-20 DIAGNOSIS — R0600 Dyspnea, unspecified: Secondary | ICD-10-CM

## 2019-12-20 DIAGNOSIS — Z794 Long term (current) use of insulin: Secondary | ICD-10-CM

## 2019-12-20 DIAGNOSIS — J9601 Acute respiratory failure with hypoxia: Secondary | ICD-10-CM | POA: Diagnosis not present

## 2019-12-20 DIAGNOSIS — E785 Hyperlipidemia, unspecified: Secondary | ICD-10-CM | POA: Diagnosis present

## 2019-12-20 DIAGNOSIS — R05 Cough: Secondary | ICD-10-CM

## 2019-12-20 DIAGNOSIS — E1165 Type 2 diabetes mellitus with hyperglycemia: Secondary | ICD-10-CM | POA: Diagnosis present

## 2019-12-20 DIAGNOSIS — R059 Cough, unspecified: Secondary | ICD-10-CM

## 2019-12-20 DIAGNOSIS — J810 Acute pulmonary edema: Principal | ICD-10-CM | POA: Diagnosis present

## 2019-12-20 DIAGNOSIS — Z7982 Long term (current) use of aspirin: Secondary | ICD-10-CM

## 2019-12-20 DIAGNOSIS — E8770 Fluid overload, unspecified: Secondary | ICD-10-CM | POA: Diagnosis present

## 2019-12-20 DIAGNOSIS — J9600 Acute respiratory failure, unspecified whether with hypoxia or hypercapnia: Secondary | ICD-10-CM | POA: Diagnosis present

## 2019-12-20 DIAGNOSIS — F329 Major depressive disorder, single episode, unspecified: Secondary | ICD-10-CM | POA: Diagnosis present

## 2019-12-20 DIAGNOSIS — Z8616 Personal history of COVID-19: Secondary | ICD-10-CM

## 2019-12-20 DIAGNOSIS — N400 Enlarged prostate without lower urinary tract symptoms: Secondary | ICD-10-CM | POA: Diagnosis present

## 2019-12-20 DIAGNOSIS — R0602 Shortness of breath: Secondary | ICD-10-CM

## 2019-12-20 DIAGNOSIS — I10 Essential (primary) hypertension: Secondary | ICD-10-CM | POA: Diagnosis present

## 2019-12-20 DIAGNOSIS — Z79899 Other long term (current) drug therapy: Secondary | ICD-10-CM

## 2019-12-20 DIAGNOSIS — B948 Sequelae of other specified infectious and parasitic diseases: Secondary | ICD-10-CM

## 2019-12-20 DIAGNOSIS — Z888 Allergy status to other drugs, medicaments and biological substances status: Secondary | ICD-10-CM

## 2019-12-20 DIAGNOSIS — Z880 Allergy status to penicillin: Secondary | ICD-10-CM

## 2019-12-20 DIAGNOSIS — Z87891 Personal history of nicotine dependence: Secondary | ICD-10-CM

## 2019-12-20 DIAGNOSIS — Z9109 Other allergy status, other than to drugs and biological substances: Secondary | ICD-10-CM

## 2019-12-20 DIAGNOSIS — E119 Type 2 diabetes mellitus without complications: Secondary | ICD-10-CM

## 2019-12-20 LAB — CBC WITH DIFFERENTIAL/PLATELET
Abs Immature Granulocytes: 0.28 10*3/uL — ABNORMAL HIGH (ref 0.00–0.07)
Basophils Absolute: 0.1 10*3/uL (ref 0.0–0.1)
Basophils Relative: 1 %
Eosinophils Absolute: 0.3 10*3/uL (ref 0.0–0.5)
Eosinophils Relative: 2 %
HCT: 35.2 % — ABNORMAL LOW (ref 39.0–52.0)
Hemoglobin: 11.1 g/dL — ABNORMAL LOW (ref 13.0–17.0)
Immature Granulocytes: 3 %
Lymphocytes Relative: 19 %
Lymphs Abs: 2.1 10*3/uL (ref 0.7–4.0)
MCH: 28 pg (ref 26.0–34.0)
MCHC: 31.5 g/dL (ref 30.0–36.0)
MCV: 88.7 fL (ref 80.0–100.0)
Monocytes Absolute: 1.3 10*3/uL — ABNORMAL HIGH (ref 0.1–1.0)
Monocytes Relative: 11 %
Neutro Abs: 7.2 10*3/uL (ref 1.7–7.7)
Neutrophils Relative %: 64 %
Platelets: 274 10*3/uL (ref 150–400)
RBC: 3.97 MIL/uL — ABNORMAL LOW (ref 4.22–5.81)
RDW: 13.2 % (ref 11.5–15.5)
WBC: 11.2 10*3/uL — ABNORMAL HIGH (ref 4.0–10.5)
nRBC: 0 % (ref 0.0–0.2)

## 2019-12-20 LAB — GLUCOSE, CAPILLARY: Glucose-Capillary: 234 mg/dL — ABNORMAL HIGH (ref 70–99)

## 2019-12-20 LAB — COMPREHENSIVE METABOLIC PANEL
ALT: 23 U/L (ref 0–44)
AST: 23 U/L (ref 15–41)
Albumin: 3 g/dL — ABNORMAL LOW (ref 3.5–5.0)
Alkaline Phosphatase: 93 U/L (ref 38–126)
Anion gap: 13 (ref 5–15)
BUN: 16 mg/dL (ref 6–20)
CO2: 25 mmol/L (ref 22–32)
Calcium: 8.5 mg/dL — ABNORMAL LOW (ref 8.9–10.3)
Chloride: 98 mmol/L (ref 98–111)
Creatinine, Ser: 0.82 mg/dL (ref 0.61–1.24)
GFR calc Af Amer: >60 mL/min (ref >60)
GFR calc non Af Amer: >60 mL/min (ref >60)
Glucose, Bld: 389 mg/dL — ABNORMAL HIGH (ref 70–99)
Potassium: 3.9 mmol/L (ref 3.5–5.1)
Sodium: 136 mmol/L (ref 135–145)
Total Bilirubin: 0.6 mg/dL (ref 0.3–1.2)
Total Protein: 6.3 g/dL — ABNORMAL LOW (ref 6.5–8.1)

## 2019-12-20 LAB — PROCALCITONIN: Procalcitonin: <0.1 ng/mL

## 2019-12-20 LAB — TROPONIN I (HIGH SENSITIVITY)
Troponin I (High Sensitivity): 32 ng/L — ABNORMAL HIGH (ref <18)
Troponin I (High Sensitivity): 22 ng/L — ABNORMAL HIGH (ref <18)

## 2019-12-20 LAB — BRAIN NATRIURETIC PEPTIDE: B Natriuretic Peptide: 264 pg/mL — ABNORMAL HIGH (ref 0.0–100.0)

## 2019-12-20 IMAGING — DX DG CHEST 1V PORT
1 series · 1 of 1 positions shown · non-contrast
Comparison: [DATE].

CLINICAL DATA: Shortness of breath.

EXAM:
PORTABLE CHEST 1 VIEW

[chest ap]
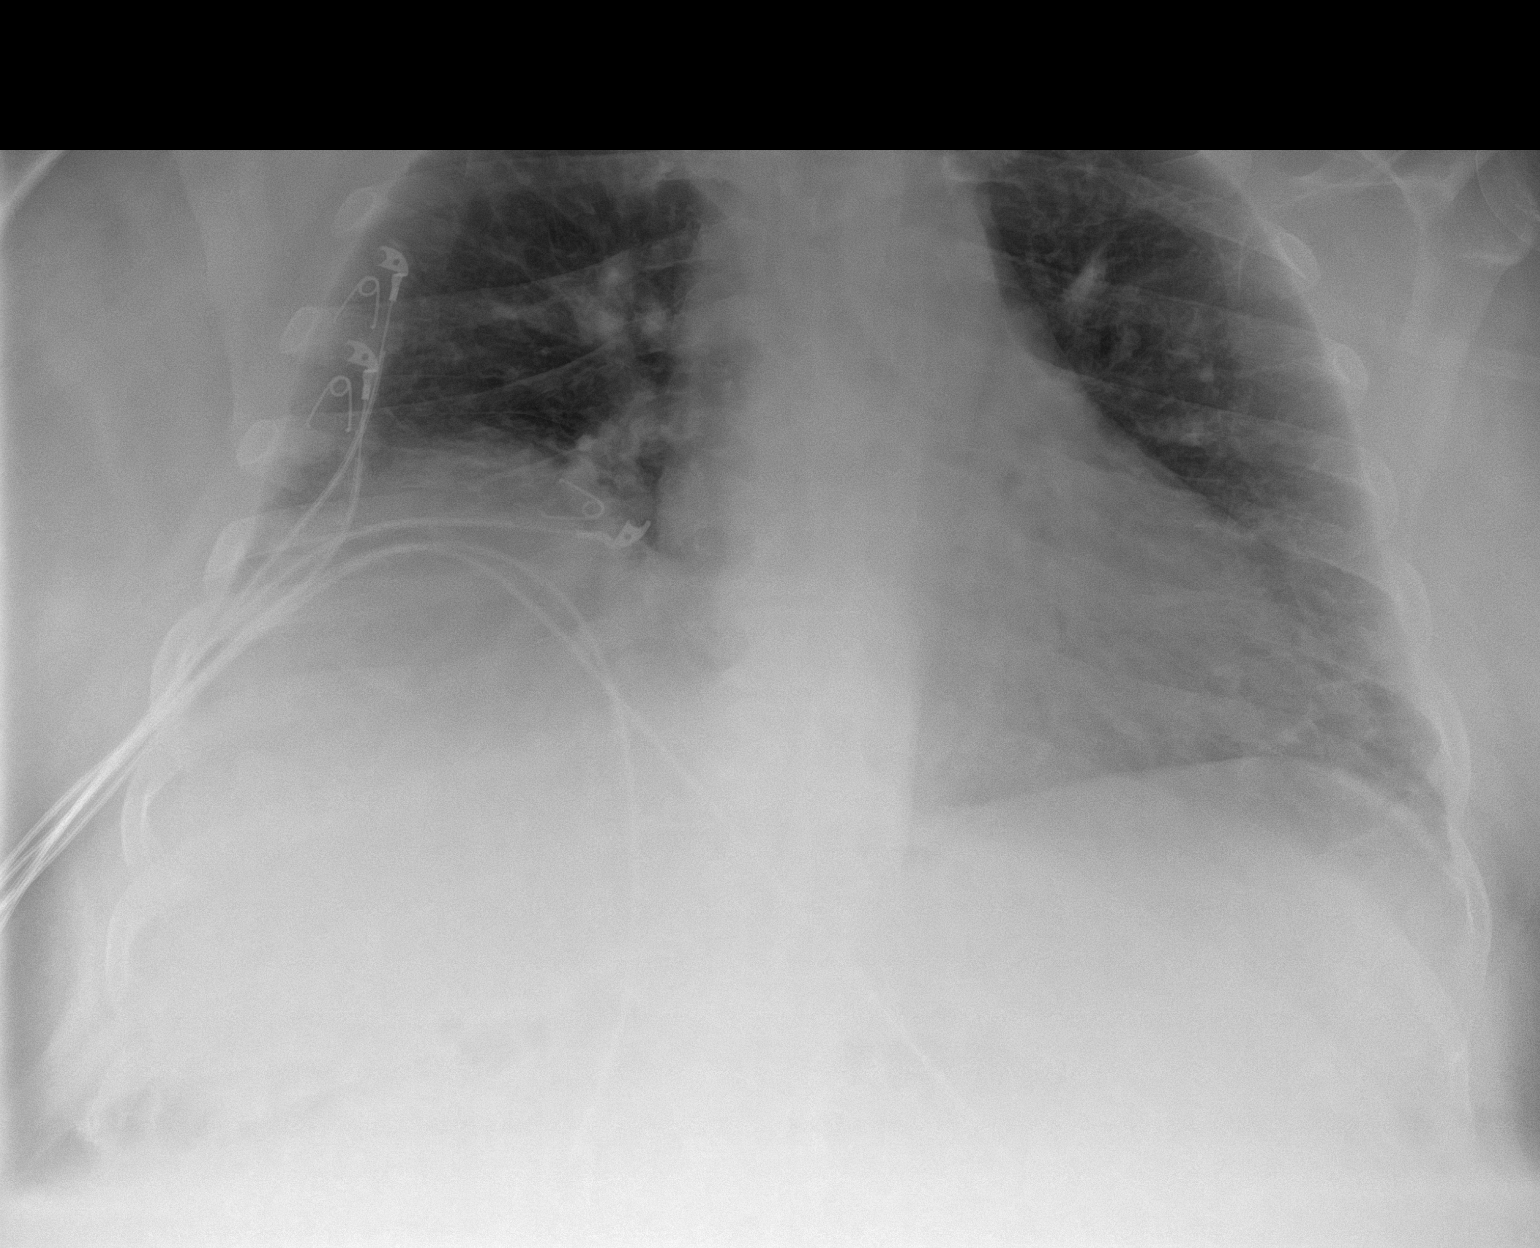

[1 of 1 positions shown; findings below may reference images not displayed]

FINDINGS: Stable cardiomegaly. No pneumothorax or pleural effusion is noted.
Elevated right hemidiaphragm is noted with minimal right basilar
subsegmental atelectasis. Left lung is clear. Bony thorax is
unremarkable.
IMPRESSION: Elevated right hemidiaphragm with minimal right basilar subsegmental
atelectasis.

## 2019-12-20 IMAGING — CT CT ANGIO CHEST
2 of 6 series · 17 of 46 positions shown · IV contrast (Omnipaque or Isovue)
Comparison: None.

CLINICAL DATA: Shortness of breath, [SQ] positive.  Cough.

EXAM:
CT ANGIOGRAPHY CHEST WITH CONTRAST
TECHNIQUE: Multidetector CT imaging of the chest was performed using the
standard protocol during bolus administration of intravenous
contrast. Multiplanar CT image reconstructions and MIPs were
obtained to evaluate the vascular anatomy.
CONTRAST:  100mL OMNIPAQUE IOHEXOL 350 MG/ML SOLN

[Series 5: pe axial thins · axial · 0.79mm/px · z∈[+1188,+1466]mm · 14 of 306 slices shown]
[im 14/306  lung]
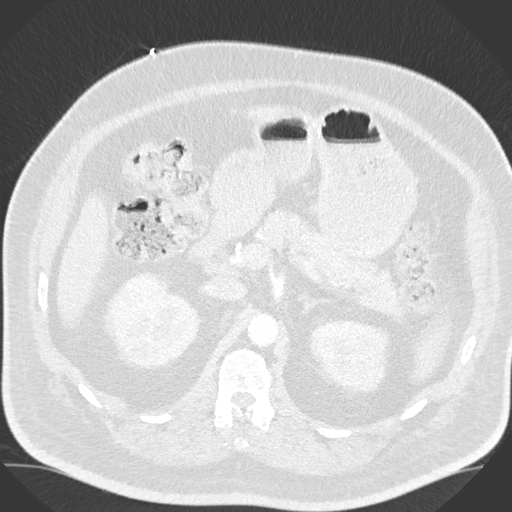
[im 40/306  soft-tissue]
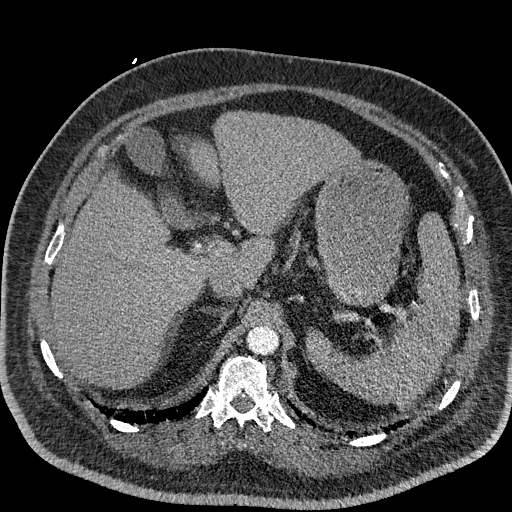
[im 54/306  lung]
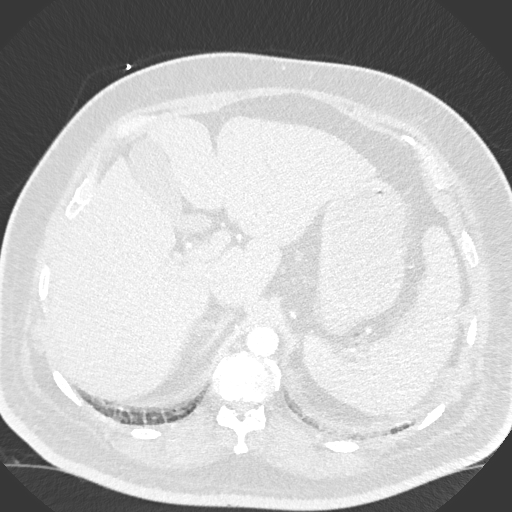
[im 80/306  soft-tissue]
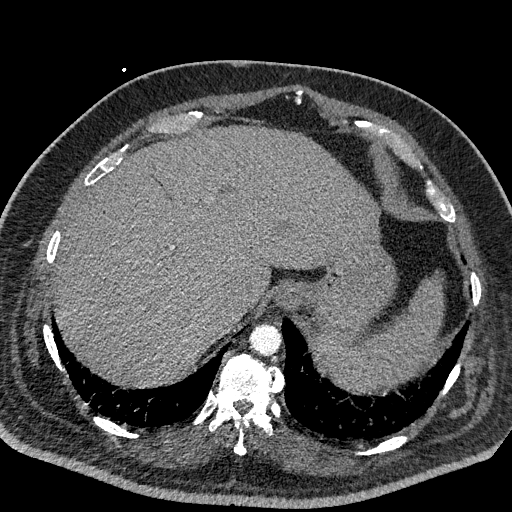
[im 107/306  lung]
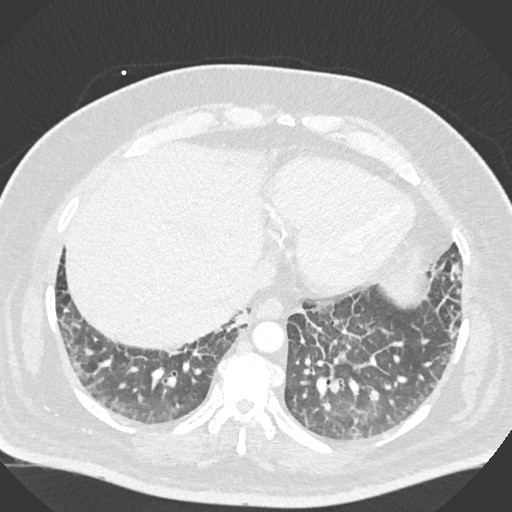
[im 120/306  soft-tissue]
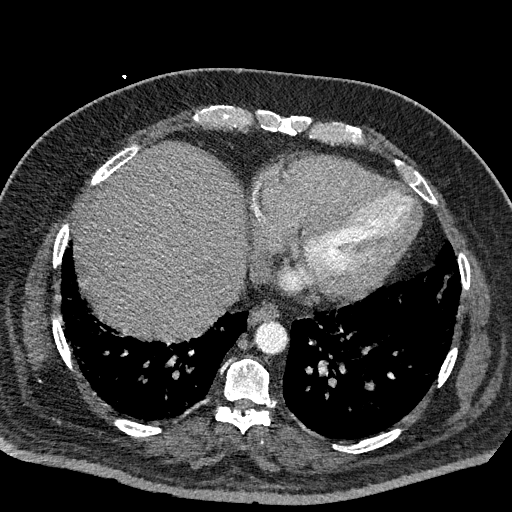
[im 146/306  lung]
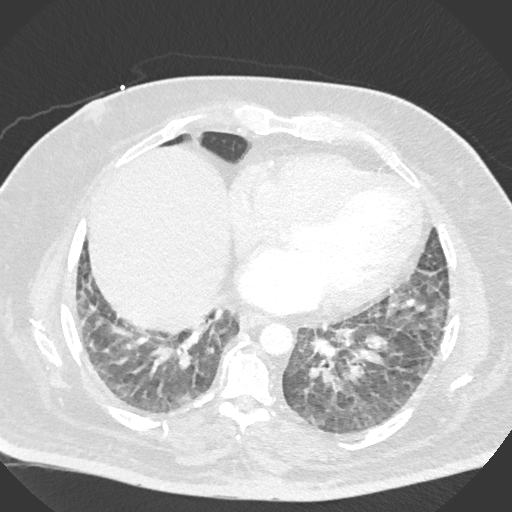
[im 160/306  soft-tissue]
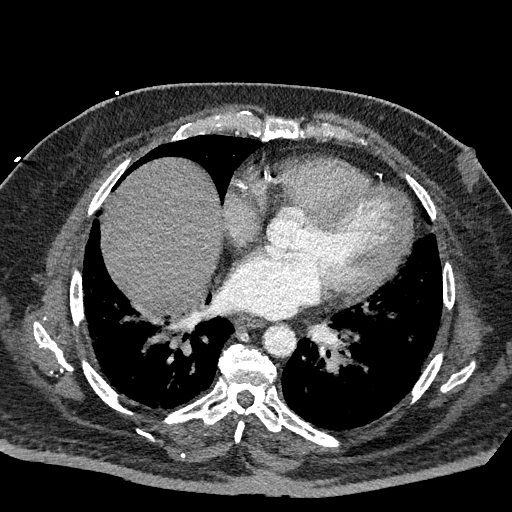
[im 186/306  lung]
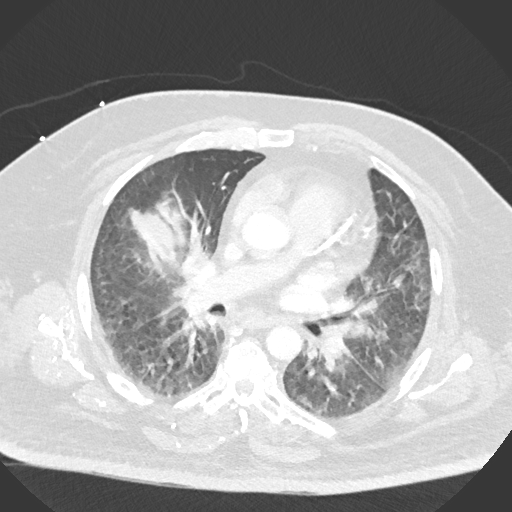
[im 199/306  soft-tissue]
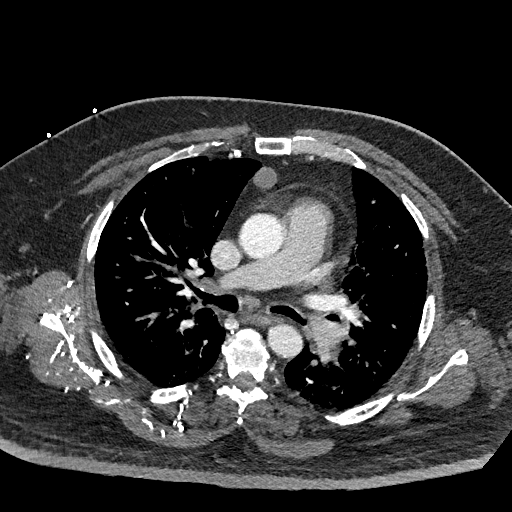
[im 226/306  lung]
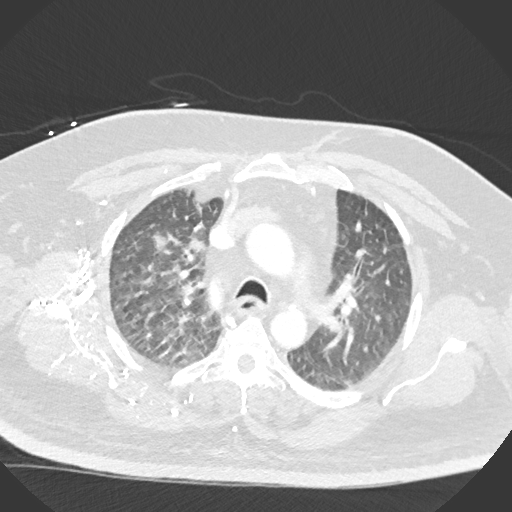
[im 252/306  soft-tissue]
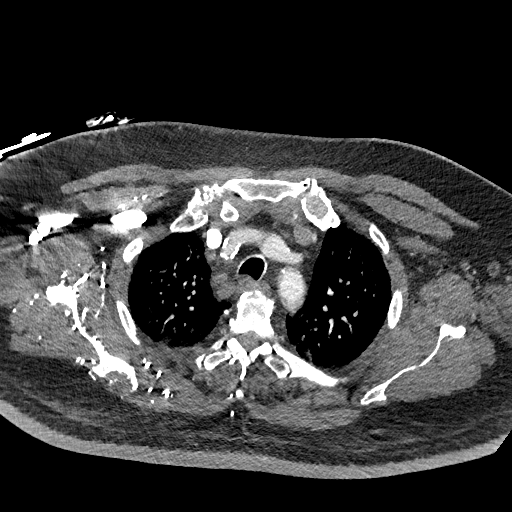
[im 266/306  lung]
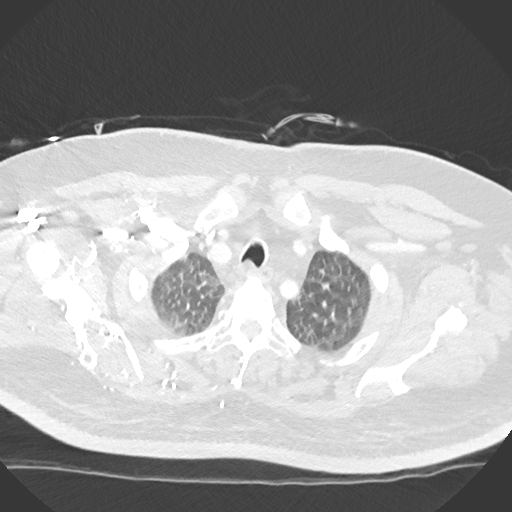
[im 292/306  soft-tissue]
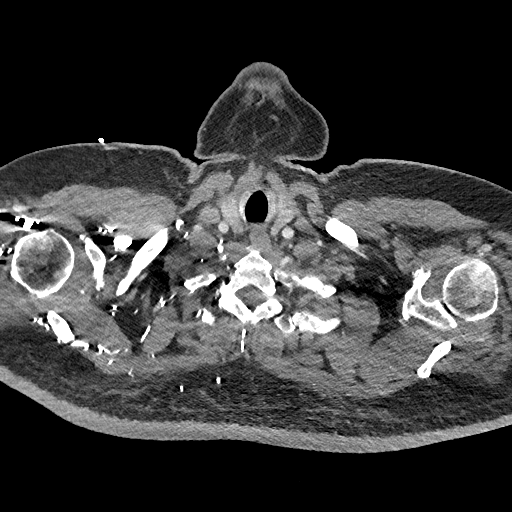

[Series 7: cor soft · coronal · 0.62mm/px · 3 of 173 slices shown]
[im 44/173  soft-tissue]
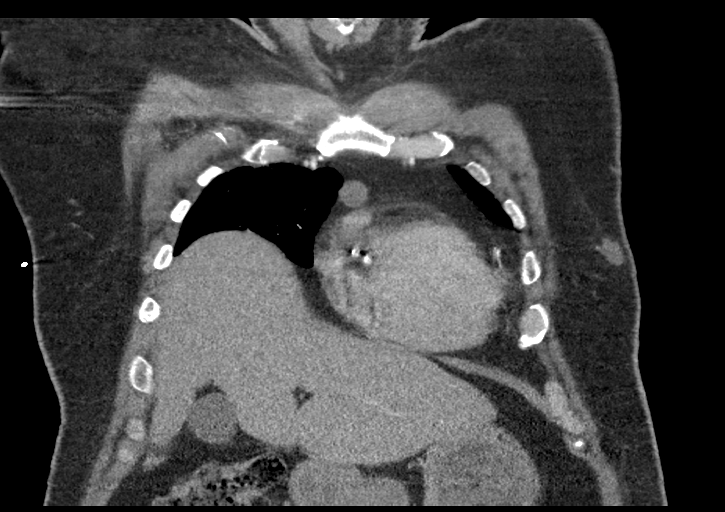
[im 87/173  soft-tissue]
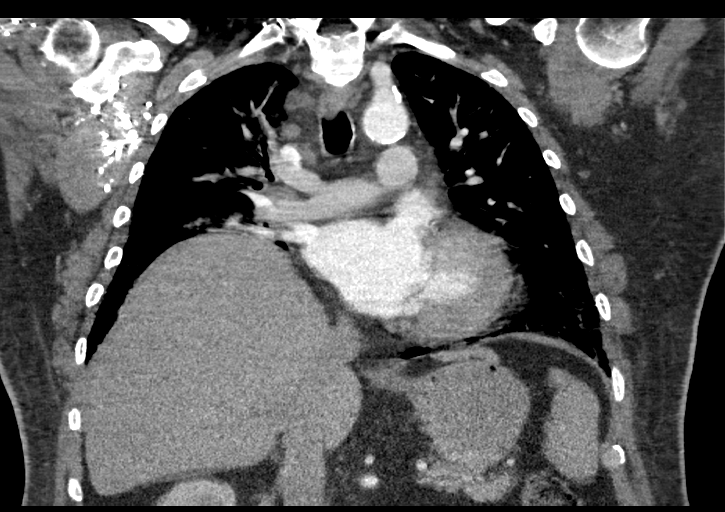
[im 130/173  soft-tissue]
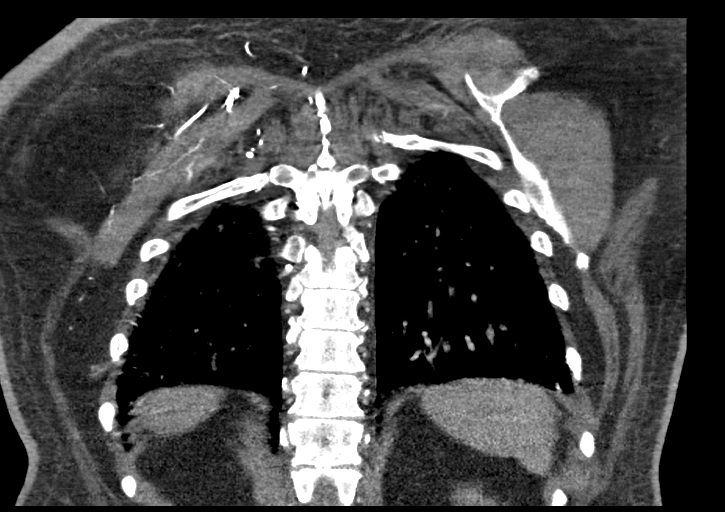

[17 of 46 positions shown; findings below may reference images not displayed]

FINDINGS: Cardiovascular: High-grade stenosis or short segment occlusion of
the right subclavian vein at the level of the coracoclavicular
ligament, with filling of multiple body wall and thyrocervical
venous collaterals.

Heart size normal.  No pericardial effusion.

Fair contrast opacification of pulmonary artery branches with no
convincing central pulmonary emboli. Limited evaluation of segmental
and subsegmental branches due to poor bolus timing and continued
patient breathing motion during the acquisition.

Extensive coronary calcifications.

Adequate contrast opacification of the thoracic aorta with no
evidence of dissection, aneurysm, or stenosis. There is bovine
variant brachiocephalic arch anatomy without proximal stenosis.
Aortic Atherosclerosis ([SQ]-170.0).

Mediastinum/Nodes: 1.7 cm enlarged pericardial lymph node. Prominent
subcentimeter right paratracheal and subcarinal nodes.

Lungs/Pleura: No ascites. No free air. Peripheral septal thickening
throughout both lungs. Patchy areas of airspace consolidation in the
anterior right upper lobe. Smaller scattered airspace opacities in
the lower lobes.

Upper Abdomen: No acute findings.

Musculoskeletal: Anterior vertebral endplate spurring at multiple
levels in the mid and lower thoracic spine.

Review of the MIP images confirms the above findings.
IMPRESSION: 1. No convincing pulmonary emboli, with limitations as above.
2. Bilateral interstitial and airspace opacities as above, may
represent atypical edema vs infectious/inflammatory etiology.
3. Coronary and Aortic Atherosclerosis ([SQ]-170.0).

## 2019-12-20 MED ORDER — ATORVASTATIN CALCIUM 40 MG PO TABS
40.0000 mg | ORAL_TABLET | Freq: Every day | ORAL | Status: DC
  Administered 2019-12-21 – 2019-12-25 (×5): 40 mg via ORAL
  Filled 2019-12-20 (×5): qty 1

## 2019-12-20 MED ORDER — VENLAFAXINE HCL ER 75 MG PO CP24
150.0000 mg | ORAL_CAPSULE | Freq: Every day | ORAL | Status: DC
  Administered 2019-12-21 – 2019-12-25 (×5): 150 mg via ORAL
  Filled 2019-12-20 (×5): qty 2

## 2019-12-20 MED ORDER — ACETAMINOPHEN 325 MG PO TABS
650.0000 mg | ORAL_TABLET | Freq: Four times a day (QID) | ORAL | Status: DC | PRN
  Administered 2019-12-22: 11:00:00 650 mg via ORAL
  Filled 2019-12-20: qty 2

## 2019-12-20 MED ORDER — INSULIN ASPART PROT & ASPART (70-30 MIX) 100 UNIT/ML ~~LOC~~ SUSP: 50.0000 [IU] | Freq: Two times a day (BID) | SUBCUTANEOUS | Status: DC

## 2019-12-20 MED ORDER — SODIUM CHLORIDE 0.9 % IV BOLUS
1000.0000 mL | Freq: Once | INTRAVENOUS | Status: AC
  Administered 2019-12-20: 13:00:00 1000 mL via INTRAVENOUS

## 2019-12-20 MED ORDER — METOPROLOL SUCCINATE ER 50 MG PO TB24
200.0000 mg | ORAL_TABLET | Freq: Every day | ORAL | Status: DC
  Administered 2019-12-21 – 2019-12-25 (×5): 200 mg via ORAL
  Filled 2019-12-20 (×5): qty 4

## 2019-12-20 MED ORDER — INSULIN ASPART PROT & ASPART (70-30 MIX) 100 UNIT/ML ~~LOC~~ SUSP
50.0000 [IU] | Freq: Two times a day (BID) | SUBCUTANEOUS | Status: DC
  Administered 2019-12-21: 10:00:00 50 [IU] via SUBCUTANEOUS
  Filled 2019-12-20: qty 10

## 2019-12-20 MED ORDER — ALBUTEROL SULFATE HFA 108 (90 BASE) MCG/ACT IN AERS
4.0000 | INHALATION_SPRAY | Freq: Once | RESPIRATORY_TRACT | Status: AC
  Administered 2019-12-20: 4 via RESPIRATORY_TRACT
  Filled 2019-12-20: qty 6.7

## 2019-12-20 MED ORDER — ONDANSETRON HCL 4 MG/2ML IJ SOLN: 4.0000 mg | Freq: Four times a day (QID) | INTRAMUSCULAR | Status: DC | PRN

## 2019-12-20 MED ORDER — GABAPENTIN 300 MG PO CAPS
900.0000 mg | ORAL_CAPSULE | Freq: Two times a day (BID) | ORAL | Status: DC
  Administered 2019-12-20 – 2019-12-25 (×10): 900 mg via ORAL
  Filled 2019-12-20 (×10): qty 3

## 2019-12-20 MED ORDER — GUAIFENESIN-CODEINE 100-10 MG/5ML PO SOLN
10.0000 mL | Freq: Four times a day (QID) | ORAL | Status: DC | PRN
  Administered 2019-12-20 – 2019-12-24 (×2): 10 mL via ORAL
  Filled 2019-12-20 (×2): qty 10

## 2019-12-20 MED ORDER — ENOXAPARIN SODIUM 40 MG/0.4ML ~~LOC~~ SOLN
40.0000 mg | SUBCUTANEOUS | Status: DC
  Administered 2019-12-20 – 2019-12-24 (×5): 40 mg via SUBCUTANEOUS
  Filled 2019-12-20 (×5): qty 0.4

## 2019-12-20 MED ORDER — POLYETHYLENE GLYCOL 3350 17 G PO PACK
17.0000 g | PACK | Freq: Every day | ORAL | Status: DC | PRN
  Administered 2019-12-24: 17 g via ORAL
  Filled 2019-12-20: qty 1

## 2019-12-20 MED ORDER — ONDANSETRON HCL 4 MG PO TABS: 4.0000 mg | ORAL_TABLET | Freq: Four times a day (QID) | ORAL | Status: DC | PRN

## 2019-12-20 MED ORDER — IOHEXOL 350 MG/ML SOLN
100.0000 mL | Freq: Once | INTRAVENOUS | Status: AC | PRN
  Administered 2019-12-20: 100 mL via INTRAVENOUS

## 2019-12-20 MED ORDER — AMLODIPINE BESYLATE 5 MG PO TABS
10.0000 mg | ORAL_TABLET | Freq: Every day | ORAL | Status: DC
  Administered 2019-12-21 – 2019-12-25 (×5): 10 mg via ORAL
  Filled 2019-12-20 (×6): qty 2

## 2019-12-20 MED ORDER — INSULIN ASPART 100 UNIT/ML ~~LOC~~ SOLN
0.0000 [IU] | Freq: Every day | SUBCUTANEOUS | Status: DC
  Administered 2019-12-20: 20:00:00 2 [IU] via SUBCUTANEOUS

## 2019-12-20 MED ORDER — ASPIRIN 81 MG PO TBEC
81.0000 mg | DELAYED_RELEASE_TABLET | Freq: Every day | ORAL | Status: DC
  Filled 2019-12-20 (×2): qty 1

## 2019-12-20 MED ORDER — TAMSULOSIN HCL 0.4 MG PO CAPS
0.4000 mg | ORAL_CAPSULE | Freq: Every day | ORAL | Status: DC
  Administered 2019-12-21 – 2019-12-25 (×5): 0.4 mg via ORAL
  Filled 2019-12-20 (×5): qty 1

## 2019-12-20 MED ORDER — FUROSEMIDE 10 MG/ML IJ SOLN
40.0000 mg | Freq: Once | INTRAMUSCULAR | Status: AC
  Administered 2019-12-20: 19:00:00 40 mg via INTRAVENOUS
  Filled 2019-12-20: qty 4

## 2019-12-20 MED ORDER — FLUTICASONE PROPIONATE 50 MCG/ACT NA SUSP
1.0000 | Freq: Every day | NASAL | Status: DC
  Administered 2019-12-21 – 2019-12-25 (×5): 1 via NASAL
  Filled 2019-12-20: qty 16

## 2019-12-20 MED ORDER — PANTOPRAZOLE SODIUM 40 MG PO TBEC
40.0000 mg | DELAYED_RELEASE_TABLET | Freq: Every day | ORAL | Status: DC
  Administered 2019-12-20 – 2019-12-25 (×6): 40 mg via ORAL
  Filled 2019-12-20 (×6): qty 1

## 2019-12-20 MED ORDER — INSULIN ASPART 100 UNIT/ML ~~LOC~~ SOLN
0.0000 [IU] | Freq: Three times a day (TID) | SUBCUTANEOUS | Status: DC
  Administered 2019-12-21: 11 [IU] via SUBCUTANEOUS
  Administered 2019-12-21: 13:00:00 15 [IU] via SUBCUTANEOUS

## 2019-12-20 MED ORDER — METHYLPREDNISOLONE SODIUM SUCC 125 MG IJ SOLR
60.0000 mg | Freq: Two times a day (BID) | INTRAMUSCULAR | Status: DC
  Administered 2019-12-20: 19:00:00 60 mg via INTRAVENOUS
  Filled 2019-12-20: qty 2

## 2019-12-20 MED ORDER — ALBUTEROL SULFATE HFA 108 (90 BASE) MCG/ACT IN AERS
1.0000 | INHALATION_SPRAY | Freq: Four times a day (QID) | RESPIRATORY_TRACT | Status: DC | PRN
  Administered 2019-12-24 – 2019-12-25 (×2): 2 via RESPIRATORY_TRACT
  Filled 2019-12-20: qty 6.7

## 2019-12-20 MED ORDER — ASPIRIN EC 81 MG PO TBEC
81.0000 mg | DELAYED_RELEASE_TABLET | Freq: Every day | ORAL | Status: DC
  Administered 2019-12-21 – 2019-12-25 (×5): 81 mg via ORAL
  Filled 2019-12-20 (×6): qty 1

## 2019-12-20 MED ORDER — FUROSEMIDE 10 MG/ML IJ SOLN
40.0000 mg | Freq: Two times a day (BID) | INTRAMUSCULAR | Status: DC
  Administered 2019-12-21 – 2019-12-25 (×9): 40 mg via INTRAVENOUS
  Filled 2019-12-20 (×9): qty 4

## 2019-12-20 NOTE — H&P (Signed)
History and Physical    Shane Chambers [id number] DOB: 01/08/1961 DOA: 12/20/2019  PCP: [name], MD   Patient coming from: Home  I have personally briefly reviewed patient's old medical records in [place]  Chief Complaint: SOB, cough  HPI: Shane Chambers is a 59 y.o. male with medical history significant for diabetes mellitus, hypertension, depression, BPH, recent Covid pneumonia.  Patient presented to the ED with complaints of worsening difficulty breathing, cough.  Admitted for COVID-19 pneumonia 1/21 - 1/26- with acute respiratory failure requiring 2 L O2, completed full course of remdesivir and steroids, inflammatory markers improved with treatment and he was discharge on room air. He tells me over the past 5 days his breathing significantly worsened.  He is also still coughing.  Denies fever or chills.  No chest pain.  He is unaware if he has gained weight as he has not been checking his weight, but he reports that his abdomen feels bloated, he has lower extremity swelling but he thinks this is mostly chronic.  Also hospitalization 1/5-1/7 for acute bronchitis.  ED Course: Temperature 98.5.  Tachypneic.  O2 sats per EDP  88- 89% on room air, improved to greater than 93% on 2 L O2.  Chest x-ray was unremarkable, CTA chest negative for PE, showed atypical edema versus infectious/inflammatory etiology.  BNP was elevated at 264.  Hs  troponin 32.  1 L bolus normal saline given, albuterol inhaler.  Hospitalist to admit for worsening dyspnea with O2 requirement.  Review of Systems: As per HPI all other systems reviewed and negative.  Past Medical History:  Diagnosis Date  . Depression   . Diabetes mellitus without complication ([place])   . Hyperlipidemia 12/01/2019  . Hypertension   . Type 2 diabetes mellitus ([place])     Past Surgical History:  Procedure Laterality Date  . DENTAL SURGERY       reports that he has quit smoking. He has never used smokeless tobacco. He  reports current alcohol use. He reports that he does not use drugs.  Allergies  Allergen Reactions  . Ace Inhibitors Swelling and Cough  . Other Itching    [name]  . Penicillins     Has patient had a PCN reaction causing immediate rash, facial/tongue/throat swelling, SOB or lightheadedness with hypotension: Unknown Has patient had a PCN reaction causing severe rash involving mucus membranes or skin necrosis: Unknown Has patient had a PCN reaction that required hospitalization: Unknown Has patient had a PCN reaction occurring within the last 10 years: Unknown If all of the above answers are "NO", then may proceed with Cephalosporin use.     Family History  Problem Relation Age of Onset  . Hypertension Mother     Prior to Admission medications   Medication Sig Start Date End Date Taking? Authorizing Provider  albuterol (VENTOLIN HFA) 108 (90 Base) MCG/ACT inhaler Inhale 1-2 puffs into the lungs every 6 (six) hours as needed for wheezing or shortness of breath.  07/27/19   [provider]  amLODipine (NORVASC) 10 MG tablet Take 10 mg by mouth daily.    [provider]  ascorbic acid (VITAMIN C) 500 MG tablet Take 1 tablet (500 mg total) by mouth daily for 14 days. 12/06/19 12/20/19  Murlean Iba, MD  aspirin 81 MG EC tablet Take 81 mg by mouth daily.  03/10/16   [provider]  atorvastatin (LIPITOR) 40 MG tablet Take 40 mg by mouth daily. 10/20/19  [provider]  chlorpheniramine-HYDROcodone (TUSSIONEX) 10-8 MG/5ML SUER Take 5 mLs by mouth every 12 (twelve) hours as needed for cough. 12/06/19   Johnson, Clanford L, MD  diclofenac Sodium (VOLTAREN) 1 % GEL Apply 2 g topically daily as needed (for pain).     [provider]  fluticasone (VERAMYST) 27.5 MCG/SPRAY nasal spray Place 2 sprays into the nose daily as needed for rhinitis or allergies.     [provider]  gabapentin (NEURONTIN) 300 MG capsule Take 900 mg by mouth 2  (two) times daily.     [provider]  indomethacin (INDOCIN) 25 MG capsule Take 25 mg by mouth 2 (two) times daily. 11/24/19   [provider]  Iron, Ferrous Sulfate, 325 (65 Fe) MG TABS Take 1 tablet by mouth daily. 12/06/19   Johnson, Clanford L, MD  metFORMIN (GLUCOPHAGE) 1000 MG tablet Take 1,000 mg by mouth 2 (two) times daily with a meal.    [provider]  metoprolol (TOPROL-XL) 200 MG 24 hr tablet Take 200 mg by mouth daily.    [provider]  Multiple Vitamins-Minerals (TAB-A-VITE) TABS Take 1 tablet by mouth daily. 07/27/19   [provider]  NOVOLOG MIX 70/30 (70-30) 100 UNIT/ML injection Inject 65 Units into the skin 2 (two) times daily with a meal.  10/28/19   [provider]  omeprazole (PRILOSEC) 40 MG capsule Take 40 mg by mouth daily.    [provider]  tamsulosin (FLOMAX) 0.4 MG CAPS capsule Take 0.4 mg by mouth daily.    [provider]  venlafaxine XR (EFFEXOR-XR) 150 MG 24 hr capsule Take 150 mg by mouth daily with breakfast.    [provider]  Vitamin D, Ergocalciferol, (DRISDOL) 1.25 MG (50000 UNIT) CAPS capsule Take 1 capsule (50,000 Units total) by mouth 2 (two) times a week. 12/08/19 01/07/20  Johnson, Clanford L, MD  zinc sulfate 220 (50 Zn) MG capsule Take 1 capsule (220 mg total) by mouth daily for 14 days. 12/06/19 12/20/19  Murlean Iba, MD    Physical Exam: Vitals:   12/20/19 1330 12/20/19 1400 12/20/19 1430 12/20/19 1500  BP: 131/63 (!) 125/48 134/64 139/68  Pulse: 77 80 77 78  Resp: 20 (!) 24 (!) 26 (!) 24  Temp:      TempSrc:      SpO2: 96% 90% 93% 93%  Weight:      Height:        Constitutional: NAD, calm, comfortable Vitals:   12/20/19 1330 12/20/19 1400 12/20/19 1430 12/20/19 1500  BP: 131/63 (!) 125/48 134/64 139/68  Pulse: 77 80 77 78  Resp: 20 (!) 24 (!) 26 (!) 24  Temp:      TempSrc:      SpO2: 96% 90% 93% 93%  Weight:      Height:       Eyes: PERRL,  lids and conjunctivae normal ENMT: Mucous membranes are moist. Posterior pharynx clear of any exudate or lesions. Neck: normal, supple, no masses, no thyromegaly Respiratory: Short of breath when making long sentences, otherwise clear to auscultation bilaterally, no wheezing, no crackles. Normal respiratory effort. No accessory muscle use.  Cardiovascular: Regular rate and rhythm, no murmurs / rubs / gallops.  +1 pitting extremity edema. 2+ pedal pulses.  Abdomen: Full/distended no tenderness, no masses palpated. No hepatosplenomegaly. Bowel sounds positive.  Localized area of ecchymosis from pharmacologic DVT prophylaxis. Musculoskeletal: no clubbing / cyanosis. No joint deformity upper and lower extremities. Good ROM, no  contractures.   Skin: no rashes, lesions, ulcers. No induration Neurologic: CN 2-12 grossly intact.  Strength 5/5 in all 4.  Psychiatric: Normal judgment and insight. Alert and oriented x 3. Normal mood.   Labs on Admission: I have personally reviewed following labs and imaging studies  CBC: Recent Labs  Lab 12/20/19 1230  WBC 11.2*  NEUTROABS 7.2  HGB 11.1*  HCT 35.2*  MCV 88.7  PLT 123456   Basic Metabolic Panel: Recent Labs  Lab 12/20/19 1230  NA 136  K 3.9  CL 98  CO2 25  GLUCOSE 389*  BUN 16  CREATININE 0.82  CALCIUM 8.5*   Liver Function Tests: Recent Labs  Lab 12/20/19 1230  AST 23  ALT 23  ALKPHOS 93  BILITOT 0.6  PROT 6.3*  ALBUMIN 3.0*    Radiological Exams on Admission: CT Angio Chest PE W and/or Wo Contrast  Result Date: 12/20/2019 CLINICAL DATA:  Shortness of breath, COVID-19 positive.  Cough. EXAM: CT ANGIOGRAPHY CHEST WITH CONTRAST TECHNIQUE: Multidetector CT imaging of the chest was performed using the standard protocol during bolus administration of intravenous contrast. Multiplanar CT image reconstructions and MIPs were obtained to evaluate the vascular anatomy. CONTRAST:  112mL OMNIPAQUE IOHEXOL 350 MG/ML SOLN COMPARISON:  None.  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 Heart size normal.  No pericardial effusion. Fair contrast opacification of pulmonary artery branches with no convincing central pulmonary emboli. Limited evaluation of segmental and subsegmental branches due to poor bolus timing and continued patient breathing motion during the acquisition. Extensive coronary calcifications. Adequate contrast opacification of the thoracic aorta with no evidence of dissection, aneurysm, or stenosis. There is bovine variant brachiocephalic arch anatomy without proximal stenosis. Aortic Atherosclerosis (ICD10-170.0). Mediastinum/Nodes: 1.7 cm enlarged pericardial lymph node. Prominent subcentimeter right paratracheal and subcarinal nodes. Lungs/Pleura: No ascites. No free air. Peripheral septal thickening throughout both lungs. Patchy areas of airspace consolidation in the anterior right upper lobe. Smaller scattered airspace opacities in the lower lobes. Upper Abdomen: No acute findings. Musculoskeletal: Anterior vertebral endplate spurring at multiple levels in the mid and lower thoracic spine. Review of the MIP images confirms the above findings. IMPRESSION: 1. No convincing pulmonary emboli, with limitations as above. 2. Bilateral interstitial and airspace opacities as above, may represent atypical edema vs infectious/inflammatory etiology. 3. Coronary and Aortic Atherosclerosis (ICD10-170.0). Electronically Signed   By: Lucrezia Europe M.D.   On: 12/20/2019 15:18   DG Chest Port 1 View  Result Date: 12/20/2019 CLINICAL DATA:  Shortness of breath. EXAM: PORTABLE CHEST 1 VIEW COMPARISON:  December 01, 2019. FINDINGS: Stable cardiomegaly. No pneumothorax or pleural effusion is noted. Elevated right hemidiaphragm is noted with minimal right basilar subsegmental  atelectasis. Left lung is clear. Bony thorax is unremarkable. IMPRESSION: Elevated right hemidiaphragm with minimal right basilar subsegmental atelectasis. Electronically Signed   By: Marijo Conception M.D.   On: 12/20/2019 12:38    EKG: Independently reviewed.  Sinus rhythm, QTC 475.  Mild nonspecific changes in lead I and aVL.  Assessment/Plan Active Problems:   Hypertension   Type 2 diabetes mellitus (HCC)   Depression   Acute respiratory failure (HCC)    Acute respiratory failure- sats 88 to 89% on room air, currently greater than 93% on 2 L.  No wheezing or rhonchi.  Afebrile.  WBC 11.2.  Chest negative for pulmonary embolism, shows bilateral  interstitial and airspace opacities, atypical edema versus infectious/inflammatory etiology.  Etiology likely secondary to volume overload with BNP Elevated at 264.    Per charts weight has increased from 280Lbs 11/15/19 to 302 today. No echo on file.  Recent COVID pneumonia likely contributing to respiratory failure. -Check procalcitonin -Obtain echocardiogram -IV Lasix 40 every 12 hourly -Input output, daily weight -BMP a.m.  Recent COVID-19 pneumonia- sequelae of this infection likely contributing to symptoms.  Required 2 L on admission was discharged on room air.  Completed course of remdesivir and steroids while hospitalized.  Improvement in inflammatory markers prior to discharge. -IV Solu-Medrol 40 mg x 1 -Will hold off on further steroids as he is diabetic -COVID-19 admission orders, continue airborne and contact precautions - Albuterol inhaler, mucolytics -Flutter valve incentive spirometry  Hypertension-stable. -Resume home Norvasc 10 mg daily, metoprolol 200 mg daily  Diabetes mellitus-blood glucose 234. - SSI- M -Resume home 7030 at reduced dose 72 >> 50U Q12H  Depression-stable. -Resume home Effexor.   DVT prophylaxis: Lovenox Code Status: Full code Family Communication: None at bedside Disposition Plan: Likely discharge to  home, pending improvement in respiratory status.  Consults called: none Admission status: Observation, telemetry   Bethena Roys MD [facility]  12/20/2019, 8:22 PM

## 2019-12-20 NOTE — ED Triage Notes (Signed)
Pt reports was admitted for shortness of breath and tested positive for covid on 12/01/19. Pt reports no improvement of symptoms and reports fever since last night with intermittent productive cough.

## 2019-12-20 NOTE — Plan of Care (Signed)

## 2019-12-20 NOTE — ED Notes (Signed)
This RN notified CT, per CT the pt is next to have scan

## 2019-12-20 NOTE — ED Provider Notes (Signed)
[facility] Provider Note   CSN: [id number] Arrival date & time: 12/20/19  1130     History Chief Complaint  Patient presents with  . Shortness of Breath    Shane Chambers is a 59 y.o. male.  Patient is a 59 year old male with past medical history of diabetes, hypertension, hyperlipidemia, and depression.  He was recently hospitalized for COVID-19 pneumonia.  He was discharged the end of January.  Since returning home, his symptoms have worsened.  He is describing shortness of breath, productive cough, and feeling generally unwell.  He also is having fevers at home.  The history is provided by the patient.  Shortness of Breath Severity:  Moderate Onset quality:  Gradual Duration:  1 week Timing:  Constant Progression:  Worsening Chronicity:  Recurrent Relieved by:  Nothing Worsened by:  Nothing Ineffective treatments:  None tried Associated symptoms: fever and sputum production        Past Medical History:  Diagnosis Date  . Depression   . Diabetes mellitus without complication ([place])   . Hyperlipidemia 12/01/2019  . Hypertension   . Type 2 diabetes mellitus [facility])     Patient Active Problem List   Diagnosis Date Noted  . Severe Vitamin D deficiency 12/02/2019  . CAP (community acquired pneumonia) 12/01/2019  . Hypokalemia 12/01/2019  . Hyperlipidemia 12/01/2019  . BPH (benign prostatic hyperplasia) 12/01/2019  . Normocytic anemia 12/01/2019  . Pneumonia due to COVID-19 virus 12/01/2019  . Hypoxia   . SOB (shortness of breath) 11/16/2019  . Sepsis ([place]) 11/16/2019  . Hypertension   . Type 2 diabetes mellitus ([place])   . Depression   . Acute bronchitis with bronchospasm 11/15/2019    Past Surgical History:  Procedure Laterality Date  . DENTAL SURGERY         Family History  Problem Relation Age of Onset  . Hypertension Mother     Social History   Tobacco Use  . Smoking status: Former Research scientist (life sciences)  . Smokeless tobacco: Never Used    Substance Use Topics  . Alcohol use: Yes    Comment: rarely  . Drug use: No    Home Medications Prior to Admission medications   Medication Sig Start Date End Date Taking? Authorizing Provider  albuterol (VENTOLIN HFA) 108 (90 Base) MCG/ACT inhaler Inhale 1-2 puffs into the lungs every 6 (six) hours as needed for wheezing or shortness of breath.  07/27/19   [provider]  amLODipine (NORVASC) 10 MG tablet Take 10 mg by mouth daily.    [provider]  ascorbic acid (VITAMIN C) 500 MG tablet Take 1 tablet (500 mg total) by mouth daily for 14 days. 12/06/19 12/20/19  Murlean Iba, MD  aspirin 81 MG EC tablet Take 81 mg by mouth daily.  03/10/16   [provider]  atorvastatin (LIPITOR) 40 MG tablet Take 40 mg by mouth daily. 10/20/19   [provider]  chlorpheniramine-HYDROcodone (TUSSIONEX) 10-8 MG/5ML SUER Take 5 mLs by mouth every 12 (twelve) hours as needed for cough. 12/06/19   Johnson, Clanford L, MD  diclofenac Sodium (VOLTAREN) 1 % GEL Apply 2 g topically daily as needed (for pain).     [provider]  fluticasone (VERAMYST) 27.5 MCG/SPRAY nasal spray Place 2 sprays into the nose daily as needed for rhinitis or allergies.     [provider]  gabapentin (NEURONTIN) 300 MG capsule Take 900 mg by mouth 2 (two) times daily.     [provider]  indomethacin (INDOCIN) 25 MG capsule Take 25 mg by mouth 2 (two) times daily. 11/24/19   [provider]  Iron, Ferrous Sulfate, 325 (65 Fe) MG TABS Take 1 tablet by mouth daily. 12/06/19   Johnson, Clanford L, MD  metFORMIN (GLUCOPHAGE) 1000 MG tablet Take 1,000 mg by mouth 2 (two) times daily with a meal.    [provider]  metoprolol (TOPROL-XL) 200 MG 24 hr tablet Take 200 mg by mouth daily.    [provider]  Multiple Vitamins-Minerals (TAB-A-VITE) TABS Take 1 tablet by mouth daily. 07/27/19   [provider]  NOVOLOG MIX 70/30 (70-30) 100  UNIT/ML injection Inject 65 Units into the skin 2 (two) times daily with a meal.  10/28/19   [provider]  omeprazole (PRILOSEC) 40 MG capsule Take 40 mg by mouth daily.    [provider]  tamsulosin (FLOMAX) 0.4 MG CAPS capsule Take 0.4 mg by mouth daily.    [provider]  venlafaxine XR (EFFEXOR-XR) 150 MG 24 hr capsule Take 150 mg by mouth daily with breakfast.    [provider]  Vitamin D, Ergocalciferol, (DRISDOL) 1.25 MG (50000 UNIT) CAPS capsule Take 1 capsule (50,000 Units total) by mouth 2 (two) times a week. 12/08/19 01/07/20  Johnson, Clanford L, MD  zinc sulfate 220 (50 Zn) MG capsule Take 1 capsule (220 mg total) by mouth daily for 14 days. 12/06/19 12/20/19  Murlean Iba, MD    Allergies    Ace inhibitors, Other, and Penicillins  Review of Systems   Review of Systems  Constitutional: Positive for fever.  Respiratory: Positive for sputum production and shortness of breath.   All other systems reviewed and are negative.   Physical Exam Updated Vital Signs BP 139/68   Pulse 78   Temp 98.5 F (36.9 C) (Oral)   Resp (!) 24   Ht 5\' 9"  (1.753 m)   Wt (!) 137 kg   SpO2 93%   BMI 44.60 kg/m   Physical Exam Vitals and nursing note reviewed.  Constitutional:      General: He is not in acute distress.    Appearance: He is well-developed. He is not diaphoretic.  HENT:     Head: Normocephalic and atraumatic.  Cardiovascular:     Rate and Rhythm: Normal rate and regular rhythm.     Heart sounds: No murmur. No friction rub.  Pulmonary:     Effort: Pulmonary effort is normal. No respiratory distress.     Breath sounds: Examination of the right-middle field reveals rhonchi. Examination of the left-middle field reveals rhonchi. Rhonchi present. No wheezing or rales.  Abdominal:     General: Bowel sounds are normal. There is no distension.     Palpations: Abdomen is soft.     Tenderness: There is no abdominal tenderness.    Musculoskeletal:        General: Normal range of motion.     Cervical back: Normal range of motion and neck supple.     Right lower leg: No edema.  Skin:    General: Skin is warm and dry.  Neurological:     Mental Status: He is alert and oriented to person, place, and time.     Coordination: Coordination normal.     ED Results / Procedures / Treatments   Labs (all labs ordered are listed, but only abnormal results are displayed) Labs Reviewed  COMPREHENSIVE METABOLIC PANEL - Abnormal; Notable for the following components:  Result Value   Glucose, Bld 389 (*)    Calcium 8.5 (*)    Total Protein 6.3 (*)    Albumin 3.0 (*)    All other components within normal limits  CBC WITH DIFFERENTIAL/PLATELET - Abnormal; Notable for the following components:   WBC 11.2 (*)    RBC 3.97 (*)    Hemoglobin 11.1 (*)    HCT 35.2 (*)    Monocytes Absolute 1.3 (*)    Abs Immature Granulocytes 0.28 (*)    All other components within normal limits  BRAIN NATRIURETIC PEPTIDE - Abnormal; Notable for the following components:   B Natriuretic Peptide 264.0 (*)    All other components within normal limits  TROPONIN I (HIGH SENSITIVITY) - Abnormal; Notable for the following components:   Troponin I (High Sensitivity) 32 (*)    All other components within normal limits  TROPONIN I (HIGH SENSITIVITY)    EKG EKG Interpretation  Date/Time:  Tuesday December 20 2019 11:52:36 EST Ventricular Rate:  75 PR Interval:  176 QRS Duration: 96 QT Interval:  426 QTC Calculation: 475 R Axis:   -8 Text Interpretation: Normal sinus rhythm Left ventricular hypertrophy with repolarization abnormality Abnormal ECG Confirmed by Veryl Speak [phone]) on 12/20/2019 12:39:10 PM   Radiology CT Angio Chest PE W and/or Wo Contrast  Result Date: 12/20/2019 CLINICAL DATA:  Shortness of breath, COVID-19 positive.  Cough. EXAM: CT ANGIOGRAPHY CHEST WITH CONTRAST TECHNIQUE: Multidetector CT imaging of the chest was  performed using the standard protocol during bolus administration of intravenous contrast. Multiplanar CT image reconstructions and MIPs were obtained to evaluate the vascular anatomy. CONTRAST:  168mL OMNIPAQUE IOHEXOL 350 MG/ML SOLN COMPARISON:  None. 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 Heart size normal.  No pericardial effusion. Fair contrast opacification of pulmonary artery branches with no convincing central pulmonary emboli. Limited evaluation of segmental and subsegmental branches due to poor bolus timing and continued patient breathing motion during the acquisition. Extensive coronary calcifications. Adequate contrast opacification of the thoracic aorta with no evidence of dissection, aneurysm, or stenosis. There is bovine variant brachiocephalic arch anatomy without proximal stenosis. Aortic Atherosclerosis (ICD10-170.0). Mediastinum/Nodes: 1.7 cm enlarged pericardial lymph node. Prominent subcentimeter right paratracheal and subcarinal nodes. Lungs/Pleura: No ascites. No free air. Peripheral septal thickening throughout both lungs. Patchy areas of airspace consolidation in the anterior right upper lobe. Smaller scattered airspace opacities in the lower lobes. Upper Abdomen: No acute findings. Musculoskeletal: Anterior vertebral endplate spurring at multiple levels in the mid and lower thoracic spine. Review of the MIP images confirms the above findings. IMPRESSION: 1. No convincing pulmonary emboli, with limitations as above. 2. Bilateral interstitial and airspace opacities as above, may represent atypical edema vs infectious/inflammatory etiology. 3. Coronary and Aortic Atherosclerosis (ICD10-170.0). Electronically Signed   By: Lucrezia Europe M.D.   On: 12/20/2019 15:18   DG Chest Port 1 View  Result Date:  12/20/2019 CLINICAL DATA:  Shortness of breath. EXAM: PORTABLE CHEST 1 VIEW COMPARISON:  December 01, 2019. FINDINGS: Stable cardiomegaly. No pneumothorax or pleural effusion is noted. Elevated right hemidiaphragm is noted with minimal right basilar subsegmental atelectasis. Left lung is clear. Bony thorax is unremarkable. IMPRESSION: Elevated right hemidiaphragm with minimal right basilar subsegmental atelectasis. Electronically Signed   By: Marijo Conception M.D.   On: 12/20/2019 12:38    Procedures Procedures (including critical care  time)  Medications Ordered in ED Medications  albuterol (VENTOLIN HFA) 108 (90 Base) MCG/ACT inhaler 4 puff (has no administration in time range)  sodium chloride 0.9 % bolus 1,000 mL (0 mLs Intravenous Stopped 12/20/19 1522)  iohexol (OMNIPAQUE) 350 MG/ML injection 100 mL (100 mLs Intravenous Contrast Given 12/20/19 1448)    ED Course  I have reviewed the triage vital signs and the nursing notes.  Pertinent labs & imaging results that were available during my care of the patient were reviewed by me and considered in my medical decision making (see chart for details).    MDM Rules/Calculators/A&P  Patient with past medical history as described above presenting with complaints of shortness of breath.  He was recently admitted for COVID-19 and states that he is now feeling worse again.  Patient's work-up today shows an elevation of his BNP, CT scan that is negative for pulmonary embolism, but does show bilateral interstitial and airspace opacities representing atypical edema versus infectious/inflammatory etiology.  Patient is hypoxic on room air with oxygen saturations dropping to the upper 80s.  2 L by nasal cannula brings his saturations up over 95%.  Patient given albuterol and will discuss admission with the hospitalist.  Final Clinical Impression(s) / ED Diagnoses Final diagnoses:  SOB (shortness of breath)    Rx / DC Orders ED Discharge Orders    None        Veryl Speak, MD 12/20/19 1546

## 2019-12-21 ENCOUNTER — Observation Stay (HOSPITAL_COMMUNITY): Payer: Medicaid Other

## 2019-12-21 DIAGNOSIS — R0602 Shortness of breath: Secondary | ICD-10-CM | POA: Diagnosis present

## 2019-12-21 DIAGNOSIS — J9601 Acute respiratory failure with hypoxia: Secondary | ICD-10-CM | POA: Diagnosis present

## 2019-12-21 DIAGNOSIS — Z888 Allergy status to other drugs, medicaments and biological substances status: Secondary | ICD-10-CM | POA: Diagnosis not present

## 2019-12-21 DIAGNOSIS — Z794 Long term (current) use of insulin: Secondary | ICD-10-CM | POA: Diagnosis not present

## 2019-12-21 DIAGNOSIS — Z79899 Other long term (current) drug therapy: Secondary | ICD-10-CM | POA: Diagnosis not present

## 2019-12-21 DIAGNOSIS — R0600 Dyspnea, unspecified: Secondary | ICD-10-CM

## 2019-12-21 DIAGNOSIS — B948 Sequelae of other specified infectious and parasitic diseases: Secondary | ICD-10-CM | POA: Diagnosis not present

## 2019-12-21 DIAGNOSIS — F329 Major depressive disorder, single episode, unspecified: Secondary | ICD-10-CM | POA: Diagnosis present

## 2019-12-21 DIAGNOSIS — E8770 Fluid overload, unspecified: Secondary | ICD-10-CM | POA: Diagnosis present

## 2019-12-21 DIAGNOSIS — E1165 Type 2 diabetes mellitus with hyperglycemia: Secondary | ICD-10-CM | POA: Diagnosis present

## 2019-12-21 DIAGNOSIS — Z880 Allergy status to penicillin: Secondary | ICD-10-CM | POA: Diagnosis not present

## 2019-12-21 DIAGNOSIS — Z9109 Other allergy status, other than to drugs and biological substances: Secondary | ICD-10-CM | POA: Diagnosis not present

## 2019-12-21 DIAGNOSIS — Z7982 Long term (current) use of aspirin: Secondary | ICD-10-CM | POA: Diagnosis not present

## 2019-12-21 DIAGNOSIS — N400 Enlarged prostate without lower urinary tract symptoms: Secondary | ICD-10-CM | POA: Diagnosis present

## 2019-12-21 DIAGNOSIS — I10 Essential (primary) hypertension: Secondary | ICD-10-CM | POA: Diagnosis present

## 2019-12-21 DIAGNOSIS — E11649 Type 2 diabetes mellitus with hypoglycemia without coma: Secondary | ICD-10-CM | POA: Diagnosis not present

## 2019-12-21 DIAGNOSIS — Z87891 Personal history of nicotine dependence: Secondary | ICD-10-CM | POA: Diagnosis not present

## 2019-12-21 DIAGNOSIS — Z8249 Family history of ischemic heart disease and other diseases of the circulatory system: Secondary | ICD-10-CM | POA: Diagnosis not present

## 2019-12-21 DIAGNOSIS — E785 Hyperlipidemia, unspecified: Secondary | ICD-10-CM | POA: Diagnosis present

## 2019-12-21 DIAGNOSIS — J810 Acute pulmonary edema: Secondary | ICD-10-CM | POA: Diagnosis present

## 2019-12-21 LAB — BASIC METABOLIC PANEL
Anion gap: 12 (ref 5–15)
BUN: 15 mg/dL (ref 6–20)
CO2: 27 mmol/L (ref 22–32)
Calcium: 8.6 mg/dL — ABNORMAL LOW (ref 8.9–10.3)
Chloride: 94 mmol/L — ABNORMAL LOW (ref 98–111)
Creatinine, Ser: 0.73 mg/dL (ref 0.61–1.24)
GFR calc Af Amer: >60 mL/min (ref >60)
GFR calc non Af Amer: >60 mL/min (ref >60)
Glucose, Bld: 382 mg/dL — ABNORMAL HIGH (ref 70–99)
Potassium: 3.9 mmol/L (ref 3.5–5.1)
Sodium: 133 mmol/L — ABNORMAL LOW (ref 135–145)

## 2019-12-21 LAB — CBC WITH DIFFERENTIAL/PLATELET
Abs Immature Granulocytes: 0.21 10*3/uL — ABNORMAL HIGH (ref 0.00–0.07)
Basophils Absolute: 0.1 10*3/uL (ref 0.0–0.1)
Basophils Relative: 1 %
Eosinophils Absolute: 0.3 10*3/uL (ref 0.0–0.5)
Eosinophils Relative: 2 %
HCT: 37.1 % — ABNORMAL LOW (ref 39.0–52.0)
Hemoglobin: 11.9 g/dL — ABNORMAL LOW (ref 13.0–17.0)
Immature Granulocytes: 2 %
Lymphocytes Relative: 6 %
Lymphs Abs: 0.7 10*3/uL (ref 0.7–4.0)
MCH: 27.8 pg (ref 26.0–34.0)
MCHC: 32.1 g/dL (ref 30.0–36.0)
MCV: 86.7 fL (ref 80.0–100.0)
Monocytes Absolute: 0.3 10*3/uL (ref 0.1–1.0)
Monocytes Relative: 2 %
Neutro Abs: 10.5 10*3/uL — ABNORMAL HIGH (ref 1.7–7.7)
Neutrophils Relative %: 87 %
Platelets: 278 10*3/uL (ref 150–400)
RBC: 4.28 MIL/uL (ref 4.22–5.81)
RDW: 13.2 % (ref 11.5–15.5)
WBC: 12.1 10*3/uL — ABNORMAL HIGH (ref 4.0–10.5)
nRBC: 0 % (ref 0.0–0.2)

## 2019-12-21 LAB — GLUCOSE, CAPILLARY
Glucose-Capillary: 347 mg/dL — ABNORMAL HIGH (ref 70–99)
Glucose-Capillary: 409 mg/dL — ABNORMAL HIGH (ref 70–99)
Glucose-Capillary: 394 mg/dL — ABNORMAL HIGH (ref 70–99)
Glucose-Capillary: 338 mg/dL — ABNORMAL HIGH (ref 70–99)
Glucose-Capillary: 334 mg/dL — ABNORMAL HIGH (ref 70–99)

## 2019-12-21 LAB — ECHOCARDIOGRAM COMPLETE
Height: 69 in
Weight: 4832.48 oz

## 2019-12-21 MED ORDER — INSULIN ASPART 100 UNIT/ML ~~LOC~~ SOLN
0.0000 [IU] | Freq: Every day | SUBCUTANEOUS | Status: DC
  Administered 2019-12-21: 21:00:00 4 [IU] via SUBCUTANEOUS

## 2019-12-21 MED ORDER — METFORMIN HCL 500 MG PO TABS
1000.0000 mg | ORAL_TABLET | Freq: Two times a day (BID) | ORAL | Status: DC
  Administered 2019-12-21 – 2019-12-23 (×5): 1000 mg via ORAL
  Filled 2019-12-21 (×6): qty 2

## 2019-12-21 MED ORDER — INSULIN ASPART 100 UNIT/ML ~~LOC~~ SOLN
6.0000 [IU] | Freq: Three times a day (TID) | SUBCUTANEOUS | Status: DC
  Administered 2019-12-21 – 2019-12-23 (×4): 6 [IU] via SUBCUTANEOUS

## 2019-12-21 MED ORDER — INSULIN ASPART 100 UNIT/ML ~~LOC~~ SOLN
0.0000 [IU] | Freq: Three times a day (TID) | SUBCUTANEOUS | Status: DC
  Administered 2019-12-21: 15 [IU] via SUBCUTANEOUS
  Administered 2019-12-22 (×2): 3 [IU] via SUBCUTANEOUS

## 2019-12-21 MED ORDER — INSULIN ASPART PROT & ASPART (70-30 MIX) 100 UNIT/ML ~~LOC~~ SUSP: 72.0000 [IU] | Freq: Two times a day (BID) | SUBCUTANEOUS | Status: DC

## 2019-12-21 MED ORDER — INSULIN ASPART PROT & ASPART (70-30 MIX) 100 UNIT/ML ~~LOC~~ SUSP
65.0000 [IU] | Freq: Two times a day (BID) | SUBCUTANEOUS | Status: DC
  Administered 2019-12-21 – 2019-12-23 (×4): 65 [IU] via SUBCUTANEOUS
  Filled 2019-12-21: qty 10

## 2019-12-21 NOTE — Progress Notes (Signed)
Inpatient Diabetes Program Recommendations  AACE/ADA: New Consensus Statement on Inpatient Glycemic Control (2015)  Target Ranges:  Prepandial:   less than 140 mg/dL      Peak postprandial:   less than 180 mg/dL (1-2 hours)      Critically ill patients:  140 - 180 mg/dL   Lab Results  Component Value Date   GLUCAP 338 (H) 12/21/2019   HGBA1C 9.8 (H) 11/15/2019    Review of Glycemic Control  Results for WESELY, MCCAMMON (MRN [id number]) as of 12/21/2019 16:50  Ref. Range 12/21/2019 07:52 12/21/2019 12:25 12/21/2019 13:56 12/21/2019 16:39  Glucose-Capillary Latest Ref Range: 70 - 99 mg/dL 347 (H) 409 (H) 394 (H) 338 (H)    Diabetes history: DM2 Outpatient Diabetes medications:  Metformin 1000 mg BID + 70/30 72 units BID Current orders for Inpatient glycemic control:  70/30 65 units BID + novolog 0-20 units TID + Novolog 6 units TID with meals + Metformin 1000 mg BID   Note:  Spoke with patient on the phone.  Reviewed patient's current A1c of 9.8%. Explained what a A1c is and what it measures. Also reviewed goal A1c with patient, importance of good glucose control @ home, and blood sugar goals.  He states 9.8% is an improvement from his last A1C.    He is current with Dr. Erlinda Hong.  He obtains all medications and supplies  without difficulty.  He checks his blood sugar TID.  Does not drink anything with sugar and tries to watch CHO intake.   Thank you, Reche Dixon, RN, BSN Diabetes Coordinator Inpatient Diabetes Program [phone] (team pager from 8a-5p)

## 2019-12-21 NOTE — Plan of Care (Signed)

## 2019-12-21 NOTE — Progress Notes (Signed)
*  PRELIMINARY RESULTS* Echocardiogram 2D Echocardiogram has been performed.  [name] 12/21/2019, 9:59 AM

## 2019-12-21 NOTE — Progress Notes (Signed)
PROGRESS NOTE    Shane Chambers  [id number] DOB: 1961/05/21 DOA: 12/20/2019 PCP: [name], MD   Brief Narrative:  Per HPI: Shane Chambers is a 59 y.o. male with medical history significant for diabetes mellitus, hypertension, depression, BPH, recent Covid pneumonia.  Patient presented to the ED with complaints of worsening difficulty breathing, cough.  Admitted for COVID-19 pneumonia 1/21 - 1/26- with acute respiratory failure requiring 2 L O2, completed full course of remdesivir and steroids, inflammatory markers improved with treatment and he was discharge on room air. He tells me over the past 5 days his breathing significantly worsened.  He is also still coughing.  Denies fever or chills.  No chest pain.  He is unaware if he has gained weight as he has not been checking his weight, but he reports that his abdomen feels bloated, he has lower extremity swelling but he thinks this is mostly chronic.  Also hospitalization 1/5-1/7 for acute bronchitis.  2/10: Patient was admitted with acute hypoxemic respiratory failure suspected to be related to acute pulmonary edema and volume overload.  2D echocardiogram has been performed with results demonstrating LVEF 50-55% and no other acute findings.  His BNP was elevated at 264 and his weight has increased from 280 pounds to 302 pounds.  Procalcitonin negative.  He is currently on isolation for recent Covid pneumonia and received 1 dose of Solu-Medrol.  Assessment & Plan:   Active Problems:   Hypertension   Type 2 diabetes mellitus (HCC)   Depression   Acute respiratory failure (HCC)   Acute respiratory failure with hypoxia (HCC)   Acute hypoxemic respiratory failure secondary to pulmonary edema/volume overload -2D echocardiogram with LVEF 50-55% and no other acute findings -Continue Lasix 40 mg IV twice daily and monitor I's and O's and daily weights -Monitor repeat labs  Recent COVID-19 pneumonia -Maintain on isolation and  hold off on further steroids -Inhalers as needed for shortness of breath or wheezing along with mucolytic's -Flutter valve and incentive spirometry  Hypertension-stable -Continue home Norvasc and metoprolol  Type 2 diabetes -Blood glucose noted to be elevated -Increase SSI and add mealtime coverage -Resume home 70/30 insulin at usual dose  Depression -Continue home Effexor   DVT prophylaxis: Lovenox Code Status: Full Family Communication: None at bedside Disposition Plan: Continue ongoing diuresis and monitor weights as well as daily labs.  Maintain on isolation.   Consultants:   None  Procedures:   See studies below  Antimicrobials:  Anti-infectives (From admission, onward)   None       Subjective: Patient seen and evaluated today with no new acute complaints or concerns. No acute concerns or events noted overnight.  He continues to have ongoing shortness of breath and finds it difficult to complete sentences.  He is noted to have a mild cough.  Objective: Vitals:   12/20/19 1747 12/20/19 1959 12/20/19 2211 12/21/19 0536  BP: (!) 145/69 (!) 142/77  135/80  Pulse: 79 72  91  Resp: 20 18  16   Temp: 97.9 F (36.6 C) 98 F (36.7 C)  98.2 F (36.8 C)  TempSrc: Oral Oral  Oral  SpO2: 97% 100% 100% 97%  Weight:      Height:        Intake/Output Summary (Last 24 hours) at 12/21/2019 1345 Last data filed at 12/21/2019 0900 Gross per 24 hour  Intake 1440 ml  Output 4750 ml  Net -3310 ml   Filed Weights   12/20/19 1147  Weight: Marland Kitchen)  137 kg    Examination:  General exam: Appears calm and comfortable, obese Respiratory system: Clear to auscultation. Respiratory effort normal.  Currently on 2 L nasal cannula. Cardiovascular system: S1 & S2 heard, RRR. No JVD, murmurs, rubs, gallops or clicks.  Pitting edema noted bilaterally. Gastrointestinal system: Abdomen is nondistended, soft and nontender. No organomegaly or masses felt. Normal bowel sounds  heard. Central nervous system: Alert and oriented. No focal neurological deficits. Extremities: Symmetric 5 x 5 power. Skin: No rashes, lesions or ulcers Psychiatry: Judgement and insight appear normal. Mood & affect appropriate.     Data Reviewed: I have personally reviewed following labs and imaging studies  CBC: Recent Labs  Lab 12/20/19 1230 12/21/19 0627  WBC 11.2* 12.1*  NEUTROABS 7.2 10.5*  HGB 11.1* 11.9*  HCT 35.2* 37.1*  MCV 88.7 86.7  PLT 274 0000000   Basic Metabolic Panel: Recent Labs  Lab 12/20/19 1230 12/21/19 0627  NA 136 133*  K 3.9 3.9  CL 98 94*  CO2 25 27  GLUCOSE 389* 382*  BUN 16 15  CREATININE 0.82 0.73  CALCIUM 8.5* 8.6*   GFR: Estimated Creatinine Clearance: 141.8 mL/min (by C-G formula based on SCr of 0.73 mg/dL). Liver Function Tests: Recent Labs  Lab 12/20/19 1230  AST 23  ALT 23  ALKPHOS 93  BILITOT 0.6  PROT 6.3*  ALBUMIN 3.0*   No results for input(s): LIPASE, AMYLASE in the last 168 hours. No results for input(s): AMMONIA in the last 168 hours. Coagulation Profile: No results for input(s): INR, PROTIME in the last 168 hours. Cardiac Enzymes: No results for input(s): CKTOTAL, CKMB, CKMBINDEX, TROPONINI in the last 168 hours. BNP (last 3 results) No results for input(s): PROBNP in the last 8760 hours. HbA1C: No results for input(s): HGBA1C in the last 72 hours. CBG: Recent Labs  Lab 12/20/19 1952 12/21/19 0752 12/21/19 1225  GLUCAP 234* 347* 409*   Lipid Profile: No results for input(s): CHOL, HDL, LDLCALC, TRIG, CHOLHDL, LDLDIRECT in the last 72 hours. Thyroid Function Tests: No results for input(s): TSH, T4TOTAL, FREET4, T3FREE, THYROIDAB in the last 72 hours. Anemia Panel: No results for input(s): VITAMINB12, FOLATE, FERRITIN, TIBC, IRON, RETICCTPCT in the last 72 hours. Sepsis Labs: Recent Labs  Lab 12/20/19 1621  PROCALCITON <0.10    No results found for this or any previous visit (from the past 240  hour(s)).       Radiology Studies: CT Angio Chest PE W and/or Wo Contrast  Result Date: 12/20/2019 CLINICAL DATA:  Shortness of breath, COVID-19 positive.  Cough. EXAM: CT ANGIOGRAPHY CHEST WITH CONTRAST TECHNIQUE: Multidetector CT imaging of the chest was performed using the standard protocol during bolus administration of intravenous contrast. Multiplanar CT image reconstructions and MIPs were obtained to evaluate the vascular anatomy. CONTRAST:  1102mL OMNIPAQUE IOHEXOL 350 MG/ML SOLN COMPARISON:  None. 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 Heart size normal.  No pericardial effusion. Fair contrast opacification of pulmonary artery branches with no convincing central pulmonary emboli. Limited evaluation of segmental and subsegmental branches due to poor bolus timing and continued patient breathing motion during the acquisition. Extensive coronary calcifications. Adequate contrast opacification of the thoracic aorta with no evidence of dissection, aneurysm, or stenosis. There is bovine variant brachiocephalic arch anatomy without proximal stenosis. Aortic Atherosclerosis (ICD10-170.0). Mediastinum/Nodes: 1.7 cm enlarged pericardial lymph node. Prominent subcentimeter right  paratracheal and subcarinal nodes. Lungs/Pleura: No ascites. No free air. Peripheral septal thickening throughout both lungs. Patchy areas of airspace consolidation in the anterior right upper lobe. Smaller scattered airspace opacities in the lower lobes. Upper Abdomen: No acute findings. Musculoskeletal: Anterior vertebral endplate spurring at multiple levels in the mid and lower thoracic spine. Review of the MIP images confirms the above findings. IMPRESSION: 1. No convincing pulmonary emboli, with limitations as above. 2. Bilateral interstitial and  airspace opacities as above, may represent atypical edema vs infectious/inflammatory etiology. 3. Coronary and Aortic Atherosclerosis (ICD10-170.0). Electronically Signed   By: Lucrezia Europe M.D.   On: 12/20/2019 15:18   DG Chest Port 1 View  Result Date: 12/20/2019 CLINICAL DATA:  Shortness of breath. EXAM: PORTABLE CHEST 1 VIEW COMPARISON:  December 01, 2019. FINDINGS: Stable cardiomegaly. No pneumothorax or pleural effusion is noted. Elevated right hemidiaphragm is noted with minimal right basilar subsegmental atelectasis. Left lung is clear. Bony thorax is unremarkable. IMPRESSION: Elevated right hemidiaphragm with minimal right basilar subsegmental atelectasis. Electronically Signed   By: Marijo Conception M.D.   On: 12/20/2019 12:38   ECHOCARDIOGRAM COMPLETE  Result Date: 12/21/2019    ECHOCARDIOGRAM REPORT   Patient Name:   Shane Chambers Date of Exam: 12/21/2019 Medical Rec #:  [id number]       Height:       69.0 in Accession #:    [id number]      Weight:       302.0 lb Date of Birth:  10/30/1961       BSA:          2.46 m Patient Age:    74 years        BP:           135/80 mmHg Patient Gender: M               HR:           91 bpm. Exam Location:  [name] Procedure: 2D Echo Indications:    Dyspnea 786.09 / R06.00  History:        Patient has no prior history of Echocardiogram examinations.                 Risk Factors:Hypertension, Diabetes, Dyslipidemia and Former                 Smoker. COVID Positive, Acute respiratory failure , Pneumonia                 due to COVID-19 virus, Sepsis.  Sonographer:    [name] RDCS (AE) Referring Phys: [place]  1. Left ventricular ejection fraction, by estimation, is 50 to 55%. The left ventricle has low normal function. The left ventrical has no regional wall motion abnormalities. There is moderately increased left ventricular hypertrophy. Left ventricular diastolic parameters are indeterminate.  2. Right ventricular systolic  function is normal. The right ventricular size is normal.  3. The mitral valve is normal in structure and function. trivial mitral valve regurgitation. No evidence of mitral stenosis.  4. The aortic valve is tricuspid. Aortic valve regurgitation is not visualized. Mild aortic valve stenosis.  5. The inferior vena cava is normal in size with greater than 50% respiratory variability, suggesting right atrial pressure of 3 mmHg. FINDINGS  Left Ventricle: Left ventricular ejection fraction, by estimation, is 50 to 55%. The left ventricle has low normal function. The left ventricle has no regional wall motion abnormalities. There  is moderately increased left ventricular hypertrophy. Left ventricular diastolic parameters are indeterminate. Right Ventricle: The right ventricular size is normal. No increase in right ventricular wall thickness. Right ventricular systolic function is normal. Left Atrium: Left atrial size was normal in size. Right Atrium: Right atrial size was normal in size. Pericardium: There is no evidence of pericardial effusion. Mitral Valve: The mitral valve is normal in structure and function. Trivial mitral valve regurgitation. No evidence of mitral valve stenosis. Tricuspid Valve: The tricuspid valve is normal in structure. Tricuspid valve regurgitation is not demonstrated. No evidence of tricuspid stenosis. Aortic Valve: The aortic valve is tricuspid. . There is mild thickening and mild calcification of the aortic valve. Aortic valve regurgitation is not visualized. Mild aortic stenosis is present. Mild aortic valve annular calcification. There is mild thickening of the aortic valve. There is mild calcification of the aortic valve. Aortic valve mean gradient measures 10.5 mmHg. Aortic valve peak gradient measures 16.3 mmHg. Aortic valve area, by VTI measures 1.93 cm. Pulmonic Valve: The pulmonic valve was not well visualized. Pulmonic valve regurgitation is not visualized. No evidence of pulmonic  stenosis. Aorta: The aortic root is normal in size and structure. Pulmonary Artery: Indeterminant PASP, inadequate TR jet.  Venous: The inferior vena cava is normal in size with greater than 50% respiratory variability, suggesting right atrial pressure of 3 mmHg. IAS/Shunts: No atrial level shunt detected by color flow Doppler.  LEFT VENTRICLE PLAX 2D LVIDd:         4.93 cm  Diastology LVIDs:         3.82 cm  LV e' lateral:   11.30 cm/s LV PW:         1.30 cm  LV E/e' lateral: 7.9 LV IVS:        1.30 cm  LV e' medial:    7.83 cm/s LVOT diam:     2.30 cm  LV E/e' medial:  11.3 LV SV:         87.98 ml LV SV Index:   19.49 LVOT Area:     4.15 cm  RIGHT VENTRICLE RV S prime:     17.30 cm/s TAPSE (M-mode): 2.2 cm LEFT ATRIUM             Index       RIGHT ATRIUM           Index LA diam:        4.50 cm 1.83 cm/m  RA Area:     14.20 cm LA Vol (A2C):   70.4 ml 28.60 ml/m RA Volume:   41.60 ml  16.90 ml/m LA Vol (A4C):   64.6 ml 26.24 ml/m LA Biplane Vol: 71.6 ml 29.09 ml/m  AORTIC VALVE AV Area (Vmax):    1.91 cm AV Area (Vmean):   1.63 cm AV Area (VTI):     1.93 cm AV Vmax:           202.02 cm/s AV Vmean:          154.600 cm/s AV VTI:            0.456 m AV Peak Grad:      16.3 mmHg AV Mean Grad:      10.5 mmHg LVOT Vmax:         92.90 cm/s LVOT Vmean:        60.838 cm/s LVOT VTI:          0.212 m LVOT/AV VTI ratio: 0.46  AORTA Ao Root diam: 3.20 cm  MITRAL VALVE MV Area (PHT): 4.89 cm             SHUNTS MV Decel Time: 155 msec             Systemic VTI:  0.21 m MV E velocity: 88.80 cm/s 103 cm/s  Systemic Diam: 2.30 cm MV A velocity: 83.90 cm/s 70.3 cm/s MV E/A ratio:  1.06       1.5 Carlyle Dolly MD Electronically signed by Carlyle Dolly MD Signature Date/Time: 12/21/2019/12:45:46 PM    Final         Scheduled Meds: . amLODipine  10 mg Oral Daily  . aspirin EC  81 mg Oral Daily  . atorvastatin  40 mg Oral Daily  . enoxaparin (LOVENOX) injection  40 mg Subcutaneous Q24H  . fluticasone  1 spray Each  Nare Daily  . furosemide  40 mg Intravenous Q12H  . gabapentin  900 mg Oral BID  . insulin aspart  0-15 Units Subcutaneous TID WC  . insulin aspart  0-5 Units Subcutaneous QHS  . insulin aspart protamine- aspart  50 Units Subcutaneous BID WC  . metoprolol  200 mg Oral Daily  . pantoprazole  40 mg Oral Daily  . tamsulosin  0.4 mg Oral Daily  . venlafaxine XR  150 mg Oral Q breakfast   Continuous Infusions:   LOS: 0 days    Time spent: 30 minutes    Rajanae Mantia Darleen Crocker, DO [facility] Pager [phone]  If 7PM-7AM, please contact night-coverage [email or web address] Password [email or web address] 12/21/2019, 1:45 PM

## 2019-12-22 ENCOUNTER — Ambulatory Visit (HOSPITAL_COMMUNITY): Payer: Medicaid Other | Attending: Family Medicine | Admitting: Physical Therapy

## 2019-12-22 DIAGNOSIS — R262 Difficulty in walking, not elsewhere classified: Secondary | ICD-10-CM | POA: Insufficient documentation

## 2019-12-22 DIAGNOSIS — M6281 Muscle weakness (generalized): Secondary | ICD-10-CM | POA: Insufficient documentation

## 2019-12-22 LAB — BASIC METABOLIC PANEL
Anion gap: 9 (ref 5–15)
BUN: 24 mg/dL — ABNORMAL HIGH (ref 6–20)
CO2: 29 mmol/L (ref 22–32)
Calcium: 8.7 mg/dL — ABNORMAL LOW (ref 8.9–10.3)
Chloride: 96 mmol/L — ABNORMAL LOW (ref 98–111)
Creatinine, Ser: 0.96 mg/dL (ref 0.61–1.24)
GFR calc Af Amer: >60 mL/min (ref >60)
GFR calc non Af Amer: >60 mL/min (ref >60)
Glucose, Bld: 149 mg/dL — ABNORMAL HIGH (ref 70–99)
Potassium: 3.6 mmol/L (ref 3.5–5.1)
Sodium: 134 mmol/L — ABNORMAL LOW (ref 135–145)

## 2019-12-22 LAB — MAGNESIUM: Magnesium: 1.5 mg/dL — ABNORMAL LOW (ref 1.7–2.4)

## 2019-12-22 LAB — GLUCOSE, CAPILLARY
Glucose-Capillary: 139 mg/dL — ABNORMAL HIGH (ref 70–99)
Glucose-Capillary: 145 mg/dL — ABNORMAL HIGH (ref 70–99)
Glucose-Capillary: 95 mg/dL (ref 70–99)
Glucose-Capillary: 107 mg/dL — ABNORMAL HIGH (ref 70–99)

## 2019-12-22 MED ORDER — POTASSIUM CHLORIDE CRYS ER 20 MEQ PO TBCR
40.0000 meq | EXTENDED_RELEASE_TABLET | Freq: Once | ORAL | Status: AC
  Administered 2019-12-22: 15:00:00 40 meq via ORAL
  Filled 2019-12-22: qty 2

## 2019-12-22 MED ORDER — MAGNESIUM SULFATE 2 GM/50ML IV SOLN
2.0000 g | Freq: Once | INTRAVENOUS | Status: AC
  Administered 2019-12-22: 10:00:00 2 g via INTRAVENOUS
  Filled 2019-12-22: qty 50

## 2019-12-22 NOTE — Progress Notes (Signed)
PROGRESS NOTE    Shane Chambers  [id number] DOB: June 25, 1961 DOA: 12/20/2019 PCP: [name], MD   Brief Narrative:  Per HPI: Shane Chambers a 59 y.o.malewith medical history significant fordiabetes mellitus, hypertension, depression, BPH,recent Covid pneumonia.  Patient presented to the ED with complaints of worsening difficulty breathing, cough. Admitted for COVID-19 pneumonia 1/21- 1/26-with acute respiratory failure requiring 2 L O2,completed full course of remdesivir and steroids, inflammatory markers improved with treatment andhewas discharge on room air. He tells me over the past 5 days his breathing significantly worsened. He is also still coughing. Denies fever or chills. No chest pain. He is unaware if he has gained weight as he has not been checking his weight, but he reports that his abdomen feels bloated, he has lower extremity swelling but he thinksthis is mostly chronic.  Also hospitalization 1/5-1/7 for acute bronchitis.  2/10: Patient was admitted with acute hypoxemic respiratory failure suspected to be related to acute pulmonary edema and volume overload.  2D echocardiogram has been performed with results demonstrating LVEF 50-55% and no other acute findings.  His BNP was elevated at 264 and his weight has increased from 280 pounds to 302 pounds.  Procalcitonin negative.  He is currently on isolation for recent Covid pneumonia and received 1 dose of Solu-Medrol.  2/11: Patient is slowly improving and weights have not been documented.  He has a -5.8 L net negative fluid balance thus far continues to have high urine output with Lasix twice daily.  Creatinine is starting to slowly trend upwards and he may be near his euvolemic state.  Continue to wean oxygen.  Patient reports that he was taken off of his chlorthalidone during his recent admission and feels that this could have contributed to his weight gain.  Assessment & Plan:   Active Problems:  Hypertension   Type 2 diabetes mellitus (HCC)   Depression   Acute respiratory failure (HCC)   Acute respiratory failure with hypoxia (HCC)   Acute hypoxemic respiratory failure secondary to pulmonary edema/volume overload -2D echocardiogram with LVEF 50-55% and no other acute findings -Continue Lasix 40 mg IV twice daily and monitor I's and O's and daily weights which have not been documented --4.1 L fluid balance over the last 24 hours noted -Monitor repeat labs  Recent COVID-19 pneumonia -Maintain on isolation and hold off on further steroids -Inhalers as needed for shortness of breath or wheezing along with mucolytic's -Flutter valve and incentive spirometry  Hypertension-stable -Continue home Norvasc and metoprolol -We will likely resume home chlorthalidone on discharge  Type 2 diabetes-improved hyperglycemia -Blood glucose noted to be elevated -Hemoglobin A1c 9.8% -Increase SSI and add mealtime coverage -Resume home 70/30 insulin at usual dose  Depression -Continue home Effexor   DVT prophylaxis: Lovenox Code Status: Full Family Communication: None at bedside Disposition Plan: Continue ongoing diuresis and monitor weights as well as daily labs.  Maintain on isolation.  Anticipate discharge in the next 24 hours if euvolemic.   Consultants:   None  Procedures:   See studies below  Antimicrobials:  Anti-infectives (From admission, onward)   None       Subjective: Patient seen and evaluated today with no new acute complaints or concerns. No acute concerns or events noted overnight.  He states that shortness of breath and cough are improving  Objective: Vitals:   12/21/19 1340 12/21/19 2019 12/22/19 0603 12/22/19 0921  BP: 131/68 (!) 128/58 (!) 144/79 134/81  Pulse: 80 77 72 70  Resp:  18 20   Temp: 98 F (36.7 C) 97.8 F (36.6 C) 98.2 F (36.8 C)   TempSrc: Oral Oral Oral   SpO2: 98% 98% 100%   Weight:      Height:         Intake/Output Summary (Last 24 hours) at 12/22/2019 1241 Last data filed at 12/22/2019 0600 Gross per 24 hour  Intake 960 ml  Output 3450 ml  Net -2490 ml   Filed Weights   12/20/19 1147  Weight: (!) 137 kg    Examination:  General exam: Appears calm and comfortable, obese Respiratory system: Clear to auscultation. Respiratory effort normal.  Currently on 2 L nasal cannula oxygen Cardiovascular system: S1 & S2 heard, RRR. No JVD, murmurs, rubs, gallops or clicks. No pedal edema. Gastrointestinal system: Abdomen is nondistended, soft and nontender. No organomegaly or masses felt. Normal bowel sounds heard. Central nervous system: Alert and oriented. No focal neurological deficits. Extremities: Symmetric 5 x 5 power. Skin: No rashes, lesions or ulcers Psychiatry: Judgement and insight appear normal. Mood & affect appropriate.     Data Reviewed: I have personally reviewed following labs and imaging studies  CBC: Recent Labs  Lab 12/20/19 1230 12/21/19 0627  WBC 11.2* 12.1*  NEUTROABS 7.2 10.5*  HGB 11.1* 11.9*  HCT 35.2* 37.1*  MCV 88.7 86.7  PLT 274 0000000   Basic Metabolic Panel: Recent Labs  Lab 12/20/19 1230 12/21/19 0627 12/22/19 0638  NA 136 133* 134*  K 3.9 3.9 3.6  CL 98 94* 96*  CO2 25 27 29   GLUCOSE 389* 382* 149*  BUN 16 15 24*  CREATININE 0.82 0.73 0.96  CALCIUM 8.5* 8.6* 8.7*  MG  --   --  1.5*   GFR: Estimated Creatinine Clearance: 118.1 mL/min (by C-G formula based on SCr of 0.96 mg/dL). Liver Function Tests: Recent Labs  Lab 12/20/19 1230  AST 23  ALT 23  ALKPHOS 93  BILITOT 0.6  PROT 6.3*  ALBUMIN 3.0*   No results for input(s): LIPASE, AMYLASE in the last 168 hours. No results for input(s): AMMONIA in the last 168 hours. Coagulation Profile: No results for input(s): INR, PROTIME in the last 168 hours. Cardiac Enzymes: No results for input(s): CKTOTAL, CKMB, CKMBINDEX, TROPONINI in the last 168 hours. BNP (last 3 results) No  results for input(s): PROBNP in the last 8760 hours. HbA1C: No results for input(s): HGBA1C in the last 72 hours. CBG: Recent Labs  Lab 12/21/19 1356 12/21/19 1639 12/21/19 2012 12/22/19 0753 12/22/19 1146  GLUCAP 394* 338* 334* 139* 145*   Lipid Profile: No results for input(s): CHOL, HDL, LDLCALC, TRIG, CHOLHDL, LDLDIRECT in the last 72 hours. Thyroid Function Tests: No results for input(s): TSH, T4TOTAL, FREET4, T3FREE, THYROIDAB in the last 72 hours. Anemia Panel: No results for input(s): VITAMINB12, FOLATE, FERRITIN, TIBC, IRON, RETICCTPCT in the last 72 hours. Sepsis Labs: Recent Labs  Lab 12/20/19 1621  PROCALCITON <0.10    No results found for this or any previous visit (from the past 240 hour(s)).       Radiology Studies: CT Angio Chest PE W and/or Wo Contrast  Result Date: 12/20/2019 CLINICAL DATA:  Shortness of breath, COVID-19 positive.  Cough. EXAM: CT ANGIOGRAPHY CHEST WITH CONTRAST TECHNIQUE: Multidetector CT imaging of the chest was performed using the standard protocol during bolus administration of intravenous contrast. Multiplanar CT image reconstructions and MIPs were obtained to evaluate the vascular anatomy. CONTRAST:  174mL OMNIPAQUE IOHEXOL 350 MG/ML SOLN COMPARISON:  None. 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 Heart size normal.  No pericardial effusion. Fair contrast opacification of pulmonary artery branches with no convincing central pulmonary emboli. Limited evaluation of segmental and subsegmental branches due to poor bolus timing and continued patient breathing motion during the acquisition. Extensive coronary calcifications. Adequate contrast opacification of the thoracic aorta with no evidence of dissection, aneurysm, or stenosis. There is bovine variant brachiocephalic  arch anatomy without proximal stenosis. Aortic Atherosclerosis (ICD10-170.0). Mediastinum/Nodes: 1.7 cm enlarged pericardial lymph node. Prominent subcentimeter right paratracheal and subcarinal nodes. Lungs/Pleura: No ascites. No free air. Peripheral septal thickening throughout both lungs. Patchy areas of airspace consolidation in the anterior right upper lobe. Smaller scattered airspace opacities in the lower lobes. Upper Abdomen: No acute findings. Musculoskeletal: Anterior vertebral endplate spurring at multiple levels in the mid and lower thoracic spine. Review of the MIP images confirms the above findings. IMPRESSION: 1. No convincing pulmonary emboli, with limitations as above. 2. Bilateral interstitial and airspace opacities as above, may represent atypical edema vs infectious/inflammatory etiology. 3. Coronary and Aortic Atherosclerosis (ICD10-170.0). Electronically Signed   By: Lucrezia Europe M.D.   On: 12/20/2019 15:18   ECHOCARDIOGRAM COMPLETE  Result Date: 12/21/2019    ECHOCARDIOGRAM REPORT   Patient Name:   Shane Chambers Date of Exam: 12/21/2019 Medical Rec #:  [id number]       Height:       69.0 in Accession #:    [id number]      Weight:       302.0 lb Date of Birth:  June 02, 1961       BSA:          2.46 m Patient Age:    68 years        BP:           135/80 mmHg Patient Gender: M               HR:           91 bpm. Exam Location:  [name] Procedure: 2D Echo Indications:    Dyspnea 786.09 / R06.00  History:        Patient has no prior history of Echocardiogram examinations.                 Risk Factors:Hypertension, Diabetes, Dyslipidemia and Former                 Smoker. COVID Positive, Acute respiratory failure , Pneumonia                 due to COVID-19 virus, Sepsis.  Sonographer:    [name] RDCS (AE) Referring Phys: [place]  1. Left ventricular ejection fraction, by estimation, is 50 to 55%. The left ventricle has low normal function. The left ventrical  has no regional wall motion abnormalities. There is moderately increased left ventricular hypertrophy. Left ventricular diastolic parameters are indeterminate.  2. Right ventricular systolic function is normal. The right ventricular size is normal.  3. The mitral valve is normal in structure and function. trivial mitral valve regurgitation. No evidence of mitral stenosis.  4. The aortic valve is tricuspid. Aortic valve regurgitation is not visualized. Mild aortic valve stenosis.  5. The inferior vena cava is normal in size with greater than 50% respiratory variability, suggesting right atrial pressure of 3 mmHg. FINDINGS  Left Ventricle: Left ventricular ejection fraction, by estimation, is 50 to 55%. The left ventricle has low normal function. The left ventricle has no regional wall motion abnormalities. There is moderately increased left ventricular hypertrophy. Left ventricular diastolic parameters are indeterminate. Right Ventricle: The right ventricular size is normal. No increase in right ventricular wall thickness. Right ventricular systolic function is normal. Left Atrium: Left atrial size was normal in size. Right Atrium: Right atrial size was normal in size. Pericardium: There is no evidence of pericardial effusion. Mitral Valve: The mitral valve is normal in structure and function. Trivial mitral valve regurgitation. No evidence of mitral valve stenosis. Tricuspid Valve: The tricuspid valve is normal in structure. Tricuspid valve regurgitation is not demonstrated. No evidence of tricuspid stenosis. Aortic Valve: The aortic valve is tricuspid. . There is mild thickening and mild calcification of the aortic valve. Aortic valve regurgitation is not visualized. Mild aortic stenosis is present. Mild aortic valve annular calcification. There is mild thickening of the aortic valve. There is mild calcification of the aortic valve. Aortic valve mean gradient measures 10.5 mmHg. Aortic valve peak gradient measures  16.3 mmHg. Aortic valve area, by VTI measures 1.93 cm. Pulmonic Valve: The pulmonic valve was not well visualized. Pulmonic valve regurgitation is not visualized. No evidence of pulmonic stenosis. Aorta: The aortic root is normal in size and structure. Pulmonary Artery: Indeterminant PASP, inadequate TR jet.  Venous: The inferior vena cava is normal in size with greater than 50% respiratory variability, suggesting right atrial pressure of 3 mmHg. IAS/Shunts: No atrial level shunt detected by color flow Doppler.  LEFT VENTRICLE PLAX 2D LVIDd:         4.93 cm  Diastology LVIDs:         3.82 cm  LV e' lateral:   11.30 cm/s LV PW:         1.30 cm  LV E/e' lateral: 7.9 LV IVS:        1.30 cm  LV e' medial:    7.83 cm/s LVOT diam:     2.30 cm  LV E/e' medial:  11.3 LV SV:         87.98 ml LV SV Index:   19.49 LVOT Area:     4.15 cm  RIGHT VENTRICLE RV S prime:     17.30 cm/s TAPSE (M-mode): 2.2 cm LEFT ATRIUM             Index       RIGHT ATRIUM           Index LA diam:        4.50 cm 1.83 cm/m  RA Area:     14.20 cm LA Vol (A2C):   70.4 ml 28.60 ml/m RA Volume:   41.60 ml  16.90 ml/m LA Vol (A4C):   64.6 ml 26.24 ml/m LA Biplane Vol: 71.6 ml 29.09 ml/m  AORTIC VALVE AV Area (Vmax):    1.91 cm AV Area (Vmean):   1.63 cm AV Area (VTI):     1.93 cm AV Vmax:           202.02 cm/s AV Vmean:          154.600 cm/s AV VTI:            0.456 m AV Peak Grad:      16.3 mmHg AV Mean Grad:      10.5 mmHg LVOT Vmax:         92.90 cm/s LVOT Vmean:  60.838 cm/s LVOT VTI:          0.212 m LVOT/AV VTI ratio: 0.46  AORTA Ao Root diam: 3.20 cm MITRAL VALVE MV Area (PHT): 4.89 cm             SHUNTS MV Decel Time: 155 msec             Systemic VTI:  0.21 m MV E velocity: 88.80 cm/s 103 cm/s  Systemic Diam: 2.30 cm MV A velocity: 83.90 cm/s 70.3 cm/s MV E/A ratio:  1.06       1.5 Carlyle Dolly MD Electronically signed by Carlyle Dolly MD Signature Date/Time: 12/21/2019/12:45:46 PM    Final         Scheduled Meds: .  amLODipine  10 mg Oral Daily  . aspirin EC  81 mg Oral Daily  . atorvastatin  40 mg Oral Daily  . enoxaparin (LOVENOX) injection  40 mg Subcutaneous Q24H  . fluticasone  1 spray Each Nare Daily  . furosemide  40 mg Intravenous Q12H  . gabapentin  900 mg Oral BID  . insulin aspart  0-20 Units Subcutaneous TID WC  . insulin aspart  0-5 Units Subcutaneous QHS  . insulin aspart  6 Units Subcutaneous TID WC  . insulin aspart protamine- aspart  65 Units Subcutaneous BID WC  . metFORMIN  1,000 mg Oral BID WC  . metoprolol  200 mg Oral Daily  . pantoprazole  40 mg Oral Daily  . potassium chloride  40 mEq Oral Once  . tamsulosin  0.4 mg Oral Daily  . venlafaxine XR  150 mg Oral Q breakfast   Continuous Infusions:   LOS: 1 day    Time spent: 30 minutes    Lynzee Lindquist Darleen Crocker, DO [facility] Pager [phone]  If 7PM-7AM, please contact night-coverage [email or web address] Password [facility] 12/22/2019, 12:41 PM

## 2019-12-22 NOTE — Plan of Care (Signed)
  Problem: Education: Goal: Knowledge of risk factors and measures for prevention of condition will improve Outcome: Progressing   Problem: Coping: Goal: Psychosocial and spiritual needs will be supported Outcome: Progressing   Problem: Respiratory: Goal: Will maintain a patent airway Outcome: Progressing Goal: Complications related to the disease process, condition or treatment will be avoided or minimized Outcome: Progressing   Problem: Education: Goal: Knowledge of General Education information will improve Description: Including pain rating scale, medication(s)/side effects and non-pharmacologic comfort measures Outcome: Progressing   Problem: Health Behavior/Discharge Planning: Goal: Ability to manage health-related needs will improve Outcome: Progressing   Problem: Clinical Measurements: Goal: Respiratory complications will improve Outcome: Progressing Goal: Cardiovascular complication will be avoided Outcome: Progressing   Problem: Safety: Goal: Ability to remain free from injury will improve Outcome: Progressing Note: Shane Chambers has remained free from falls and injuries this shift.

## 2019-12-22 NOTE — Plan of Care (Signed)

## 2019-12-23 LAB — MAGNESIUM: Magnesium: 1.6 mg/dL — ABNORMAL LOW (ref 1.7–2.4)

## 2019-12-23 LAB — GLUCOSE, CAPILLARY
Glucose-Capillary: 61 mg/dL — ABNORMAL LOW (ref 70–99)
Glucose-Capillary: 163 mg/dL — ABNORMAL HIGH (ref 70–99)
Glucose-Capillary: 116 mg/dL — ABNORMAL HIGH (ref 70–99)
Glucose-Capillary: 76 mg/dL (ref 70–99)
Glucose-Capillary: 52 mg/dL — ABNORMAL LOW (ref 70–99)
Glucose-Capillary: 59 mg/dL — ABNORMAL LOW (ref 70–99)
Glucose-Capillary: 89 mg/dL (ref 70–99)

## 2019-12-23 LAB — BASIC METABOLIC PANEL WITH GFR
Anion gap: 12 (ref 5–15)
BUN: 25 mg/dL — ABNORMAL HIGH (ref 6–20)
CO2: 30 mmol/L (ref 22–32)
Calcium: 9.1 mg/dL (ref 8.9–10.3)
Chloride: 95 mmol/L — ABNORMAL LOW (ref 98–111)
Creatinine, Ser: 0.85 mg/dL (ref 0.61–1.24)
GFR calc Af Amer: >60 mL/min
GFR calc non Af Amer: >60 mL/min
Glucose, Bld: 76 mg/dL (ref 70–99)
Potassium: 3.9 mmol/L (ref 3.5–5.1)
Sodium: 137 mmol/L (ref 135–145)

## 2019-12-23 MED ORDER — GLUCOSE 40 % PO GEL
ORAL | Status: AC
  Filled 2019-12-23: qty 1

## 2019-12-23 MED ORDER — INSULIN ASPART PROT & ASPART (70-30 MIX) 100 UNIT/ML ~~LOC~~ SUSP
60.0000 [IU] | Freq: Two times a day (BID) | SUBCUTANEOUS | Status: DC
  Administered 2019-12-23: 60 [IU] via SUBCUTANEOUS
  Filled 2019-12-23: qty 10

## 2019-12-23 MED ORDER — MAGNESIUM SULFATE 2 GM/50ML IV SOLN
2.0000 g | Freq: Once | INTRAVENOUS | Status: AC
  Administered 2019-12-23: 09:00:00 2 g via INTRAVENOUS
  Filled 2019-12-23: qty 50

## 2019-12-23 NOTE — Progress Notes (Signed)
Pt's blood sugar 53.  Regular cola given.  Recheck was 59.  Regular ginger ale provided. Will reassess in 15 minutes.

## 2019-12-23 NOTE — Progress Notes (Signed)
PROGRESS NOTE    [name]  [id number] DOB: 20-Jun-1961 DOA: 12/20/2019 PCP: [name], MD   Brief Narrative:  Per HPI: Shane Chambers a 59 y.o.malewith medical history significant fordiabetes mellitus, hypertension, depression, BPH,recent Covid pneumonia.  Patient presented to the ED with complaints of worsening difficulty breathing, cough. Admitted for COVID-19 pneumonia 1/21- 1/26-with acute respiratory failure requiring 2 L O2,completed full course of remdesivir and steroids, inflammatory markers improved with treatment andhewas discharge on room air. He tells me over the past 5 days his breathing significantly worsened. He is also still coughing. Denies fever or chills. No chest pain. He is unaware if he has gained weight as he has not been checking his weight, but he reports that his abdomen feels bloated, he has lower extremity swelling but he thinksthis is mostly chronic.  Also hospitalization 1/5-1/7 for acute bronchitis.  2/10: Patient was admitted with acute hypoxemic respiratory failure suspected to be related to acute pulmonary edema and volume overload. 2D echocardiogram has been performed with results demonstrating LVEF 50-55% and no other acute findings. His BNP was elevated at 264 and his weight has increased from 280 pounds to 302 pounds. Procalcitonin negative. He is currently on isolation for recent Covid pneumonia and received 1 dose of Solu-Medrol.  2/11: Patient is slowly improving and weights have not been documented.  He has a -5.8 L net negative fluid balance thus far continues to have high urine output with Lasix twice daily.  Creatinine is starting to slowly trend upwards and he may be near his euvolemic state.  Continue to wean oxygen.  Patient reports that he was taken off of his chlorthalidone during his recent admission and feels that this could have contributed to his weight gain.  2/12: Patient continues to diurese quite  well with over 3 L every 24 hours.  Creatinine appears to remain stable and weight is steadily decreasing.  We will continue current trend as he is slowly improving.  He still has clinical signs of volume overload with pitting edema and remains on 2 L nasal cannula oxygen.  Assessment & Plan:   Active Problems:   Hypertension   Type 2 diabetes mellitus (HCC)   Depression   Acute respiratory failure (HCC)   Acute respiratory failure with hypoxia (HCC)   Acute hypoxemic respiratory failure secondary to pulmonary edema/volume overload -2D echocardiogram with LVEF 50-55% and no other acute findings -Continue Lasix 40 mg IV twice daily and monitor I's and O's and daily weights which have not been documented --3.46 L fluid balance over the last 24 hours noted -Monitor repeat labs -Wean oxygen as tolerated  Recent COVID-19 pneumonia -Maintain on isolation and hold off on further steroids -Inhalers as needed for shortness of breath or wheezing along with mucolytic's -Flutter valve and incentive spirometry  Hypertension-stable -Continue home Norvasc and metoprolol -We will likely resume home chlorthalidone on discharge  Type 2 diabetes-improved hyperglycemia -Blood glucose noted to be elevated -Hemoglobin A1c 9.8% -Increase SSI and add mealtime coverage -Resume home 70/30 insulin at usual dose  Depression -Continue homeEffexor   DVT prophylaxis:Lovenox Code Status:Full Family Communication:None at bedside Disposition Plan:Continue ongoing diuresis and monitor weights as well as daily labs. Maintain on isolation.  Anticipate discharge in the next 24 hours if euvolemic.   Consultants:  None  Procedures:  See studies below  Antimicrobials:   None   Subjective: Patient seen and evaluated today with no new acute complaints or concerns. No acute concerns or events noted  overnight.  He states that his shortness of breath and cough are slowly improving.   Objective: Vitals:   12/22/19 2100 12/23/19 0500 12/23/19 0600 12/23/19 0858  BP: 124/73  (!) 148/76 140/72  Pulse: 66  65 68  Resp: 17  17   Temp: 98 F (36.7 C)  98 F (36.7 C)   TempSrc: Oral  Oral   SpO2: 100%  98%   Weight:  132.6 kg    Height:        Intake/Output Summary (Last 24 hours) at 12/23/2019 1401 Last data filed at 12/23/2019 1000 Gross per 24 hour  Intake 1040 ml  Output 4500 ml  Net -3460 ml   Filed Weights   12/20/19 1147 12/23/19 0500  Weight: (!) 137 kg 132.6 kg    Examination:  General exam: Appears calm and comfortable, obese Respiratory system: Clear to auscultation. Respiratory effort normal.  Currently on 2 L nasal cannula oxygen Cardiovascular system: S1 & S2 heard, RRR. No JVD, murmurs, rubs, gallops or clicks. No pedal edema. Gastrointestinal system: Abdomen is nondistended, soft and nontender. No organomegaly or masses felt. Normal bowel sounds heard. Central nervous system: Alert and oriented. No focal neurological deficits. Extremities: 1+ pitting edema noted bilaterally Skin: No rashes, lesions or ulcers Psychiatry: Judgement and insight appear normal. Mood & affect appropriate.     Data Reviewed: I have personally reviewed following labs and imaging studies  CBC: Recent Labs  Lab 12/20/19 1230 12/21/19 0627  WBC 11.2* 12.1*  NEUTROABS 7.2 10.5*  HGB 11.1* 11.9*  HCT 35.2* 37.1*  MCV 88.7 86.7  PLT 274 0000000   Basic Metabolic Panel: Recent Labs  Lab 12/20/19 1230 12/21/19 0627 12/22/19 0638 12/23/19 0428  NA 136 133* 134* 137  K 3.9 3.9 3.6 3.9  CL 98 94* 96* 95*  CO2 25 27 29 30   GLUCOSE 389* 382* 149* 76  BUN 16 15 24* 25*  CREATININE 0.82 0.73 0.96 0.85  CALCIUM 8.5* 8.6* 8.7* 9.1  MG  --   --  1.5* 1.6*   GFR: Estimated Creatinine Clearance: 131.1 mL/min (by C-G formula based on SCr of 0.85 mg/dL). Liver Function Tests: Recent Labs  Lab 12/20/19 1230  AST 23  ALT 23  ALKPHOS 93  BILITOT 0.6  PROT 6.3*   ALBUMIN 3.0*   No results for input(s): LIPASE, AMYLASE in the last 168 hours. No results for input(s): AMMONIA in the last 168 hours. Coagulation Profile: No results for input(s): INR, PROTIME in the last 168 hours. Cardiac Enzymes: No results for input(s): CKTOTAL, CKMB, CKMBINDEX, TROPONINI in the last 168 hours. BNP (last 3 results) No results for input(s): PROBNP in the last 8760 hours. HbA1C: No results for input(s): HGBA1C in the last 72 hours. CBG: Recent Labs  Lab 12/22/19 1649 12/22/19 2139 12/23/19 0756 12/23/19 0951 12/23/19 1106  GLUCAP 95 107* 61* 163* 116*   Lipid Profile: No results for input(s): CHOL, HDL, LDLCALC, TRIG, CHOLHDL, LDLDIRECT in the last 72 hours. Thyroid Function Tests: No results for input(s): TSH, T4TOTAL, FREET4, T3FREE, THYROIDAB in the last 72 hours. Anemia Panel: No results for input(s): VITAMINB12, FOLATE, FERRITIN, TIBC, IRON, RETICCTPCT in the last 72 hours. Sepsis Labs: Recent Labs  Lab 12/20/19 1621  PROCALCITON <0.10    No results found for this or any previous visit (from the past 240 hour(s)).       Radiology Studies: No results found.      Scheduled Meds: . amLODipine  10 mg Oral  Daily  . aspirin EC  81 mg Oral Daily  . atorvastatin  40 mg Oral Daily  . enoxaparin (LOVENOX) injection  40 mg Subcutaneous Q24H  . fluticasone  1 spray Each Nare Daily  . furosemide  40 mg Intravenous Q12H  . gabapentin  900 mg Oral BID  . insulin aspart  0-20 Units Subcutaneous TID WC  . insulin aspart  0-5 Units Subcutaneous QHS  . insulin aspart  6 Units Subcutaneous TID WC  . insulin aspart protamine- aspart  65 Units Subcutaneous BID WC  . metFORMIN  1,000 mg Oral BID WC  . metoprolol  200 mg Oral Daily  . pantoprazole  40 mg Oral Daily  . tamsulosin  0.4 mg Oral Daily  . venlafaxine XR  150 mg Oral Q breakfast   Continuous Infusions:   LOS: 2 days    Time spent: 30 minutes    Chanteria Haggard Darleen Crocker, DO [facility] Pager [phone]  If 7PM-7AM, please contact night-coverage [email or web address] Password [facility] 12/23/2019, 2:01 PM

## 2019-12-23 NOTE — Progress Notes (Signed)
Inpatient Diabetes Program Recommendations  AACE/ADA: New Consensus Statement on Inpatient Glycemic Control (2015)  Target Ranges:  Prepandial:   less than 140 mg/dL      Peak postprandial:   less than 180 mg/dL (1-2 hours)      Critically ill patients:  140 - 180 mg/dL   Lab Results  Component Value Date   GLUCAP 116 (H) 12/23/2019   HGBA1C 9.8 (H) 11/15/2019    Review of Glycemic ControlResults for Shane Chambers, Shane Chambers (MRN [id number]) as of 12/23/2019 14:24  Ref. Range 12/23/2019 07:56 12/23/2019 09:51 12/23/2019 11:06  Glucose-Capillary Latest Ref Range: 70 - 99 mg/dL 61 (L) 163 (H) 116 (H)   Outpatient Diabetes medications:  Metformin 1000 mg BID + 70/30 72 units BID Current orders for Inpatient glycemic control:  70/30 65 units BID + novolog 0-20 units TID + Novolog 6 units TID with meals + Metformin 1000 mg BID   Inpatient Diabetes Program Recommendations:   Note CBG=61 mg/dL this AM. Please d/c Novolog meal coverage 6 units since patient is receiving 70/30 insulin which has meal coverage included.  Also consider reducing PM dose of 70/30 to 58 units with supper (1700).    Thanks  Adah Perl, RN, BC-ADM Inpatient Diabetes Coordinator Pager [phone] (8a-5p)

## 2019-12-24 ENCOUNTER — Inpatient Hospital Stay (HOSPITAL_COMMUNITY): Payer: Medicaid Other

## 2019-12-24 LAB — GLUCOSE, CAPILLARY
Glucose-Capillary: 79 mg/dL (ref 70–99)
Glucose-Capillary: 137 mg/dL — ABNORMAL HIGH (ref 70–99)
Glucose-Capillary: 162 mg/dL — ABNORMAL HIGH (ref 70–99)
Glucose-Capillary: 167 mg/dL — ABNORMAL HIGH (ref 70–99)
Glucose-Capillary: 146 mg/dL — ABNORMAL HIGH (ref 70–99)
Glucose-Capillary: 135 mg/dL — ABNORMAL HIGH (ref 70–99)

## 2019-12-24 LAB — BASIC METABOLIC PANEL
Anion gap: 13 (ref 5–15)
BUN: 20 mg/dL (ref 6–20)
CO2: 31 mmol/L (ref 22–32)
Calcium: 9.2 mg/dL (ref 8.9–10.3)
Chloride: 94 mmol/L — ABNORMAL LOW (ref 98–111)
Creatinine, Ser: 0.84 mg/dL (ref 0.61–1.24)
GFR calc Af Amer: >60 mL/min (ref >60)
GFR calc non Af Amer: >60 mL/min (ref >60)
Glucose, Bld: 89 mg/dL (ref 70–99)
Potassium: 3.6 mmol/L (ref 3.5–5.1)
Sodium: 138 mmol/L (ref 135–145)

## 2019-12-24 LAB — CBC
HCT: 40.2 % (ref 39.0–52.0)
Hemoglobin: 12.4 g/dL — ABNORMAL LOW (ref 13.0–17.0)
MCH: 27.4 pg (ref 26.0–34.0)
MCHC: 30.8 g/dL (ref 30.0–36.0)
MCV: 88.9 fL (ref 80.0–100.0)
Platelets: 263 10*3/uL (ref 150–400)
RBC: 4.52 MIL/uL (ref 4.22–5.81)
RDW: 13.6 % (ref 11.5–15.5)
WBC: 12.6 10*3/uL — ABNORMAL HIGH (ref 4.0–10.5)
nRBC: 0 % (ref 0.0–0.2)

## 2019-12-24 LAB — LACTATE DEHYDROGENASE: LDH: 266 U/L — ABNORMAL HIGH (ref 98–192)

## 2019-12-24 LAB — MAGNESIUM: Magnesium: 1.7 mg/dL (ref 1.7–2.4)

## 2019-12-24 LAB — D-DIMER, QUANTITATIVE: D-Dimer, Quant: 0.28 ug/mL-FEU (ref 0.00–0.50)

## 2019-12-24 LAB — C-REACTIVE PROTEIN: CRP: 0.5 mg/dL (ref <1.0)

## 2019-12-24 LAB — FERRITIN: Ferritin: 51 ng/mL (ref 24–336)

## 2019-12-24 IMAGING — DX DG CHEST 1V
2 series · 2 of 2 positions shown · non-contrast
Comparison: [DATE]

CLINICAL DATA: Cough. [J4] positive.

EXAM:
CHEST  1 VIEW

[chest ap]
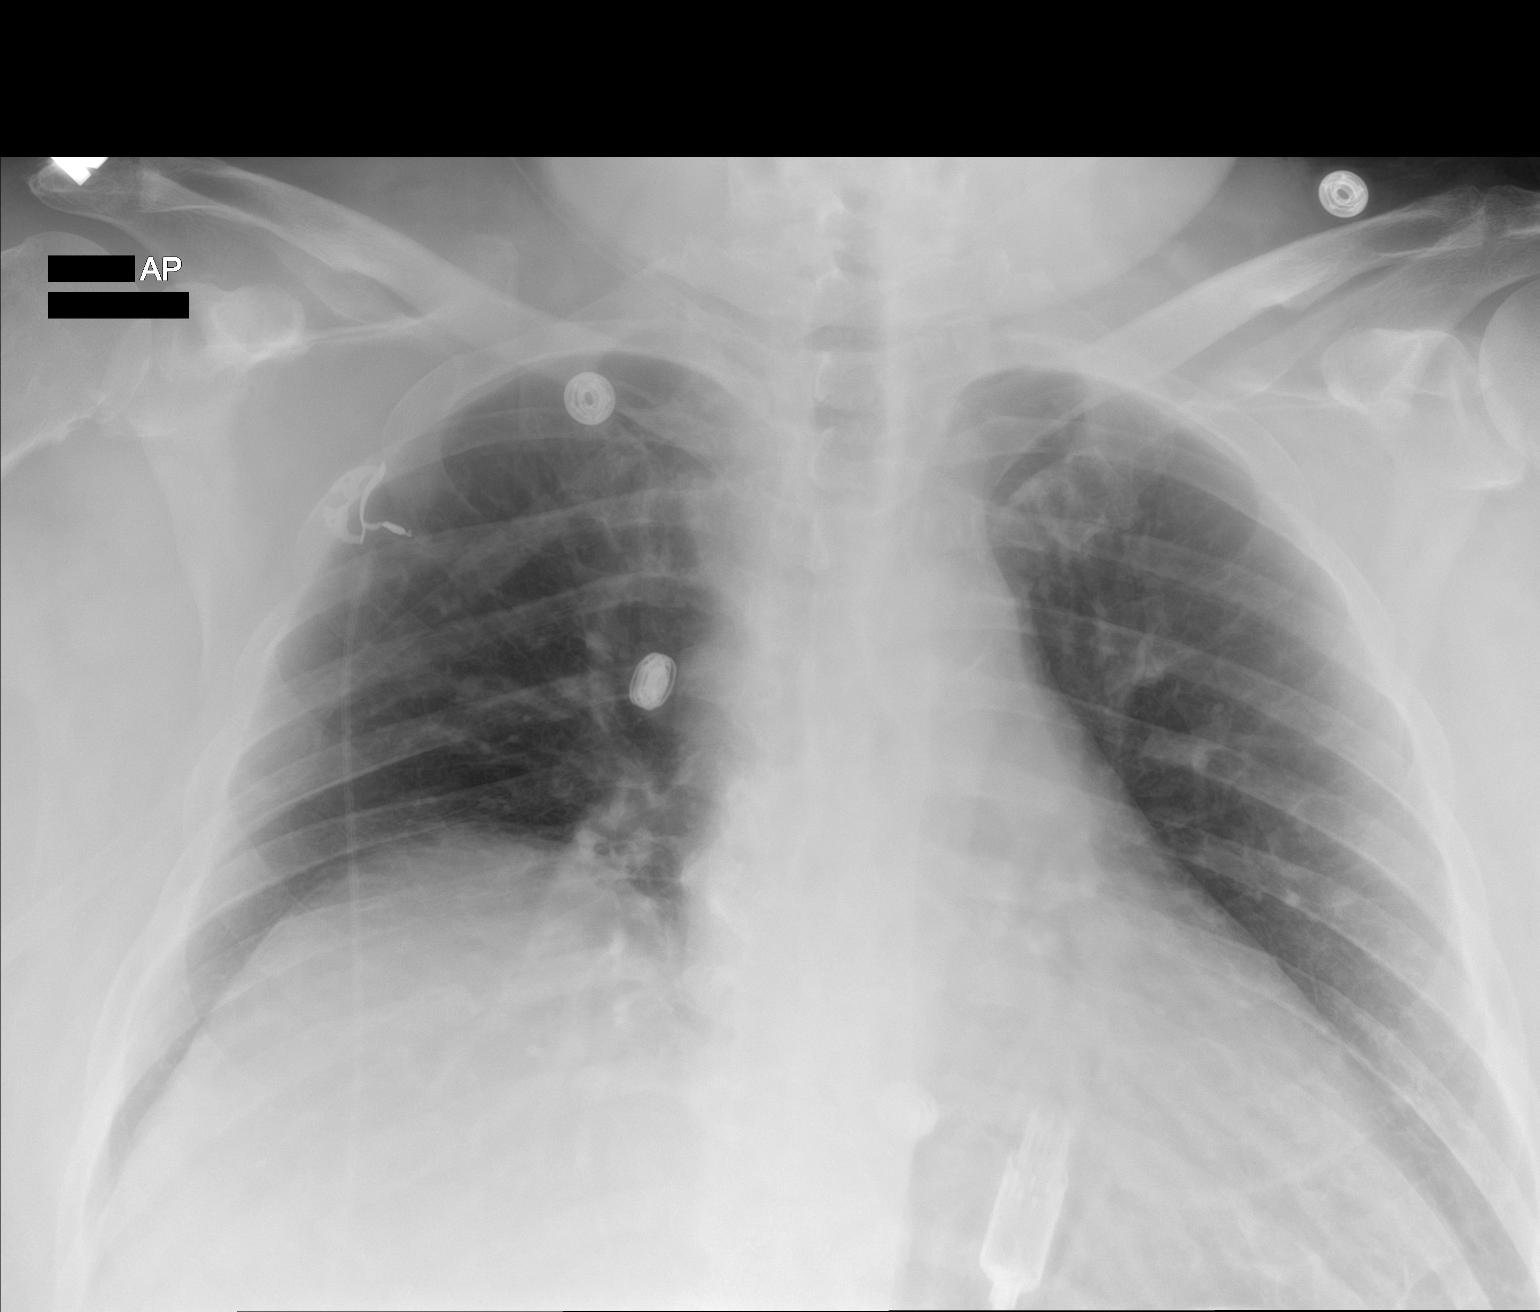

[chest ap grid]
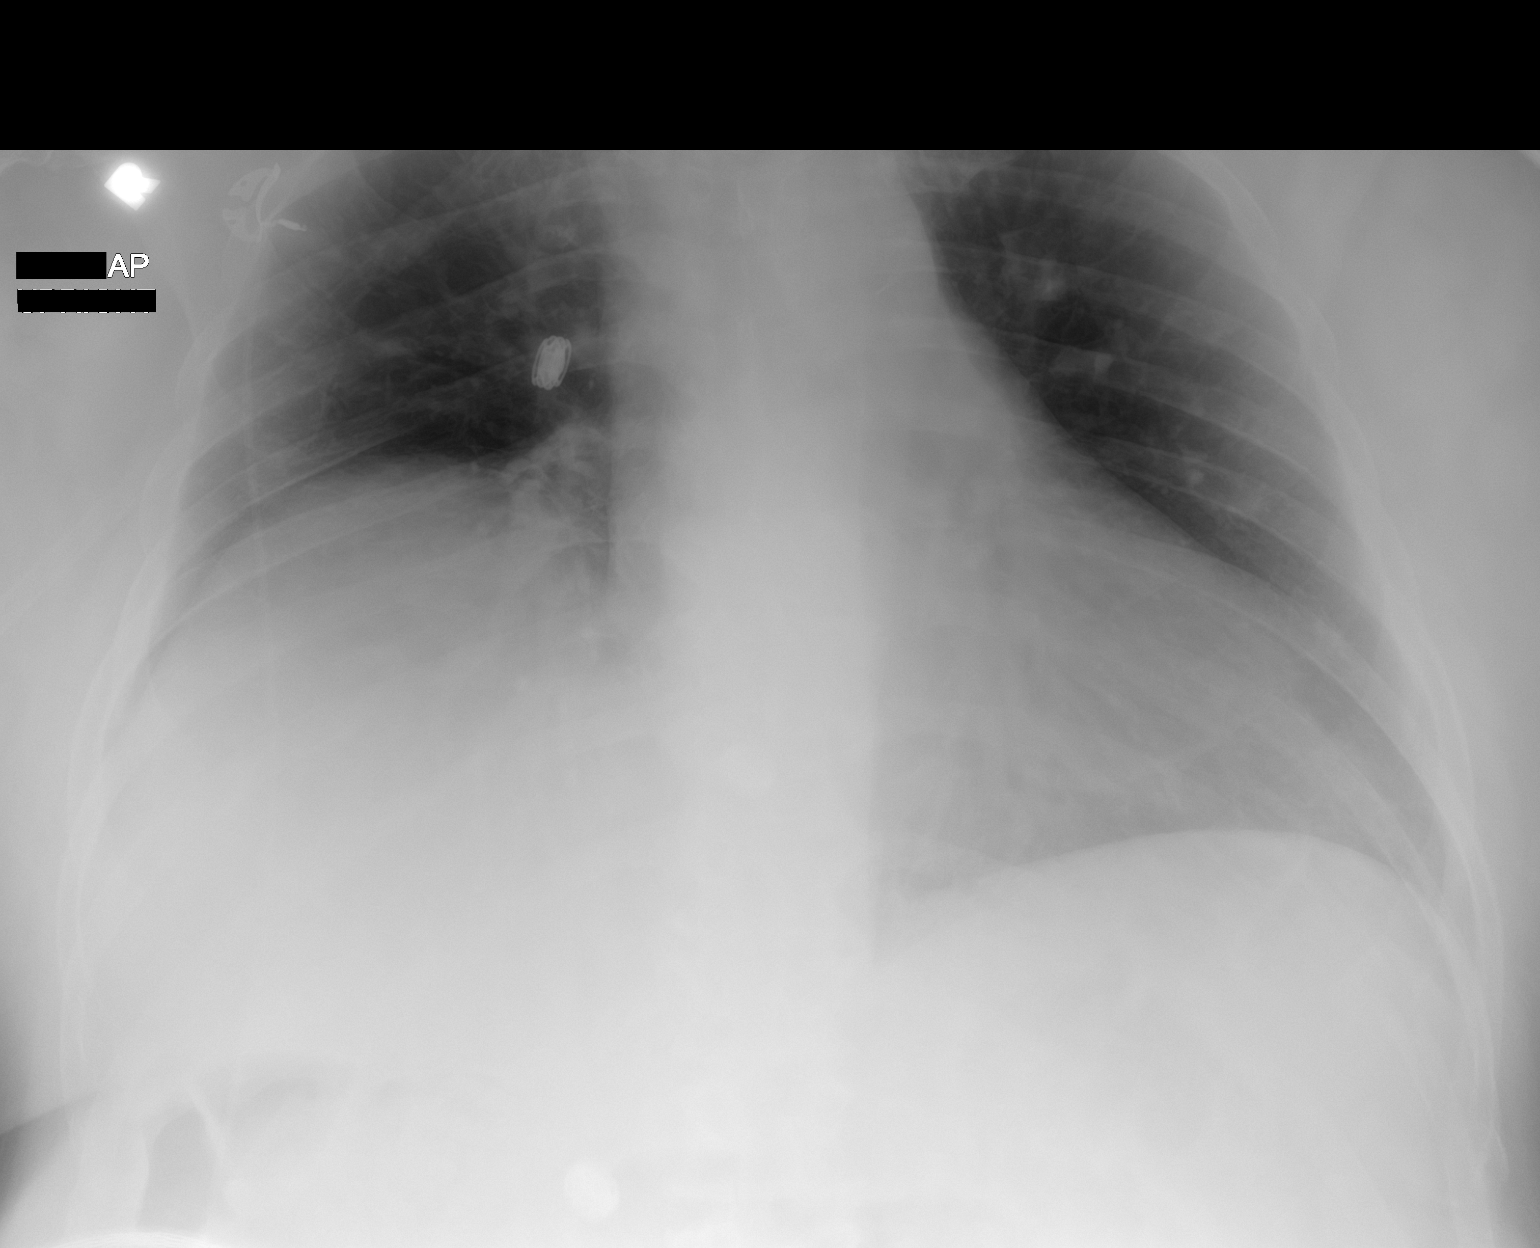

[2 of 2 positions shown; findings below may reference images not displayed]

FINDINGS: The cardiac silhouette is upper limits of normal in size. There is
persistent elevation of the right hemidiaphragm. There is subtle
irregular interstitial nodular thickening which is similar to the
prior radiograph, however the small consolidative lung opacities on
CT are not readily apparent radiographically. No new airspace
consolidation, pleural effusion, or pneumothorax is identified.
IMPRESSION: Subtle bilateral lung opacities which were much better demonstrated
on the prior CT. No evidence of progressive pneumonia or overt
edema.

## 2019-12-24 MED ORDER — INSULIN ASPART 100 UNIT/ML ~~LOC~~ SOLN
0.0000 [IU] | Freq: Three times a day (TID) | SUBCUTANEOUS | Status: DC
  Administered 2019-12-24 – 2019-12-25 (×3): 2 [IU] via SUBCUTANEOUS

## 2019-12-24 MED ORDER — MOMETASONE FURO-FORMOTEROL FUM 100-5 MCG/ACT IN AERO
2.0000 | INHALATION_SPRAY | Freq: Two times a day (BID) | RESPIRATORY_TRACT | Status: DC
  Administered 2019-12-24 – 2019-12-25 (×2): 2 via RESPIRATORY_TRACT
  Filled 2019-12-24: qty 8.8

## 2019-12-24 MED ORDER — INSULIN ASPART 100 UNIT/ML ~~LOC~~ SOLN: 0.0000 [IU] | Freq: Every day | SUBCUTANEOUS | Status: DC

## 2019-12-24 MED ORDER — DOCUSATE SODIUM 100 MG PO CAPS
100.0000 mg | ORAL_CAPSULE | Freq: Two times a day (BID) | ORAL | Status: DC
  Administered 2019-12-24 – 2019-12-25 (×3): 100 mg via ORAL
  Filled 2019-12-24 (×3): qty 1

## 2019-12-24 MED ORDER — INSULIN ASPART PROT & ASPART (70-30 MIX) 100 UNIT/ML ~~LOC~~ SUSP
55.0000 [IU] | Freq: Two times a day (BID) | SUBCUTANEOUS | Status: DC
  Administered 2019-12-24 – 2019-12-25 (×3): 55 [IU] via SUBCUTANEOUS
  Filled 2019-12-24: qty 10

## 2019-12-24 NOTE — Progress Notes (Signed)
In to set pt up for shower per his earlier request. Pt sleeping, awakened to name called. Asked pt if he was ready for his shower, he stated,"I'm tired right now, I was sleeping pretty good. Can I wait an hour or so before I bathe?" Told pt that would be fine.  VS obtained. B/P lower than this am but pt denies c/o. Sleepy but when asked if he felt his bloodsugar was OK, pt stated, "Yea, it's fine." Pt asked for pack of peanut butter crackers but when I returned with snack, pt was asleep again. Accucheck obtained due to history of hypoglycemia during the night, bloodsugar is 167 mg/dl. Pt again denies c/o, states, "im just tired."

## 2019-12-24 NOTE — Progress Notes (Signed)
Pt states that he had small, hard formed brown stool earlier this morning. Pt cleaned self with wash cloth that was still lying in bathroom floor. Blood noted on washcloth. When questioned, pt states, "Oh, I got hemorrhoids and every time I strain to poop they bleed. That ain't nothing new." Miralax given per pt request for constipation and hard stools.

## 2019-12-24 NOTE — Progress Notes (Signed)
Pt awake and oriented. Ate 100% of supper tray. SaO2 96% on RA, denies SOB, no cough noted in past 3 hours. Pt states, "I sure do feel better since I got that good nap today. Thanks for letting me sleep!"

## 2019-12-24 NOTE — Progress Notes (Signed)
Pt has been in room with O2 off since 0830 per pt report. At 0927, pt's RA SaO2 is 97%, denies SOB, still with dry hacking cough. AM assessment completed, morning meds administered. Pt then assisted to ambulate 45 ft in room using front wheeled walker. Pt tolerated well, resp rate up to 21/min and SaO2 on RA 96%. Once sitting and recovered, RR down to 18/min, SaO2 97% on RA.  Pt stated to this nurse that he has been using his own albuterol inhaler throughout the night. Advised pt that this med is ordered by MD and if he feels he needs inhaler to please call nurse so that we can admin hospital issued med and document. Pt stated understanding.

## 2019-12-24 NOTE — Progress Notes (Signed)
PROGRESS NOTE    PENG HINESLEY  [id number] DOB: Oct 10, 1961 DOA: 12/20/2019 PCP: [name], MD   Brief Narrative:  Per HPI: [name] a 59 y.o.malewith medical history significant fordiabetes mellitus, hypertension, depression, BPH,recent Covid pneumonia.  Patient presented to the ED with complaints of worsening difficulty breathing, cough. Admitted for COVID-19 pneumonia 1/21- 1/26-with acute respiratory failure requiring 2 L O2,completed full course of remdesivir and steroids, inflammatory markers improved with treatment andhewas discharge on room air. He tells me over the past 5 days his breathing significantly worsened. He is also still coughing. Denies fever or chills. No chest pain. He is unaware if he has gained weight as he has not been checking his weight, but he reports that his abdomen feels bloated, he has lower extremity swelling but he thinksthis is mostly chronic.  Also hospitalization 1/5-1/7 for acute bronchitis.  2/10: Patient was admitted with acute hypoxemic respiratory failure suspected to be related to acute pulmonary edema and volume overload. 2D echocardiogram has been performed with results demonstrating LVEF 50-55% and no other acute findings. His BNP was elevated at 264 and his weight has increased from 280 pounds to 302 pounds. Procalcitonin negative. He is currently on isolation for recent Covid pneumonia and received 1 dose of Solu-Medrol.  2/11:Patient is slowly improving and weights have not been documented. He has a -5.8 L net negative fluid balance thus far continues to have high urine output with Lasix twice daily. Creatinine is starting to slowly trend upwards and he may be near his euvolemic state. Continue to wean oxygen. Patient reports that he was taken off of his chlorthalidone during his recent admission and feels that this could have contributed to his weight gain.  2/12: Patient continues to diurese quite  well with over 3 L every 24 hours.  Creatinine appears to remain stable and weight is steadily decreasing.  We will continue current trend as he is slowly improving.  He still has clinical signs of volume overload with pitting edema and remains on 2 L nasal cannula oxygen.  2/13: Patient continues to remain on 2 L nasal cannula oxygen and diuresis well.  He is still quite volume overloaded and has a weight of 288 pounds, but this is downtrending.  Chest x-ray with no significant findings and some improvement noted today.  He has no elevation of his inflammatory markers.  Will start him on some Dulera to assist with his cough.  He was noted to have some periods of hypoglycemia for which insulin coverage has been decreased.  Assessment & Plan:   Active Problems:   Hypertension   Type 2 diabetes mellitus (HCC)   Depression   Acute respiratory failure (HCC)   Acute respiratory failure with hypoxia (HCC)   Acute hypoxemic respiratory failure secondary to pulmonary edema/volume overload -2D echocardiogram with LVEF 50-55% and no other acute findings -Continue Lasix 40 mg IV twice daily and monitor I's and O's and daily weightswhich have not been documented --1.4 L fluid balance over the last 24 hours noted -Monitor repeat labs -Wean oxygen as tolerated -Start on [place]  Recent COVID-19 pneumonia -Maintain on isolation and hold off on further steroids -Inhalers as needed for shortness of breath or wheezing along with mucolytic's -Flutter valve and incentive spirometry  Hypertension-stable -Continue home Norvasc and metoprolol -We will likely resume home chlorthalidone on discharge  Type 2 diabetes-with episodes of hypoglycemia -Blood glucose noted to be elevated -Hemoglobin A1c 9.8% -SSI coverage has been decreased -70/30 insulin  dose has been decreased to 55 units twice daily  Depression -Continue homeEffexor   DVT prophylaxis:Lovenox Code Status:Full Family  Communication:None at bedside Disposition Plan:Continue ongoing diuresis and monitor weights as well as daily labs. Maintain on isolation.Anticipate discharge in the next 24 hours if euvolemic and weaned off oxygen.   Consultants:  None  Procedures:  See studies below  Antimicrobials:   None  Subjective: Patient seen and evaluated today with concerns of hypoglycemia noted overnight.  He still has a mild cough and continues to remain on oxygen.  He is still hypervolemic.  Objective: Vitals:   12/23/19 2200 12/24/19 0500 12/24/19 0927 12/24/19 0951  BP: 113/60 (!) 112/50 123/73   Pulse: 66 71 70 80  Resp: 16 16 18  (!) 21  Temp: 97.9 F (36.6 C) 98.3 F (36.8 C) 97.9 F (36.6 C)   TempSrc: Oral Oral Oral   SpO2: 99% 100% 97% 96%  Weight:  130.8 kg    Height:        Intake/Output Summary (Last 24 hours) at 12/24/2019 1316 Last data filed at 12/24/2019 1200 Gross per 24 hour  Intake 1440 ml  Output 2850 ml  Net -1410 ml   Filed Weights   12/20/19 1147 12/23/19 0500 12/24/19 0500  Weight: (!) 137 kg 132.6 kg 130.8 kg    Examination:  General exam: Appears calm and comfortable  Respiratory system: Clear to auscultation. Respiratory effort normal.  Currently on 2 L nasal cannula oxygen Cardiovascular system: S1 & S2 heard, RRR. No JVD, murmurs, rubs, gallops or clicks. Gastrointestinal system: Abdomen is nondistended, soft and nontender. No organomegaly or masses felt. Normal bowel sounds heard. Central nervous system: Alert and oriented. No focal neurological deficits. Extremities: Scant-1+ pitting edema noted bilaterally Skin: No rashes, lesions or ulcers Psychiatry: Judgement and insight appear normal. Mood & affect appropriate.     Data Reviewed: I have personally reviewed following labs and imaging studies  CBC: Recent Labs  Lab 12/20/19 1230 12/21/19 0627 12/24/19 0734  WBC 11.2* 12.1* 12.6*  NEUTROABS 7.2 10.5*  --   HGB 11.1* 11.9* 12.4*   HCT 35.2* 37.1* 40.2  MCV 88.7 86.7 88.9  PLT 274 278 [id number]   Basic Metabolic Panel: Recent Labs  Lab 12/20/19 1230 12/21/19 0627 12/22/19 0638 12/23/19 0428 12/24/19 0734  NA 136 133* 134* 137 138  K 3.9 3.9 3.6 3.9 3.6  CL 98 94* 96* 95* 94*  CO2 25 27 29 30 31   GLUCOSE 389* 382* 149* 76 89  BUN 16 15 24* 25* 20  CREATININE 0.82 0.73 0.96 0.85 0.84  CALCIUM 8.5* 8.6* 8.7* 9.1 9.2  MG  --   --  1.5* 1.6* 1.7   GFR: Estimated Creatinine Clearance: 131.5 mL/min (by C-G formula based on SCr of 0.84 mg/dL). Liver Function Tests: Recent Labs  Lab 12/20/19 1230  AST 23  ALT 23  ALKPHOS 93  BILITOT 0.6  PROT 6.3*  ALBUMIN 3.0*   No results for input(s): LIPASE, AMYLASE in the last 168 hours. No results for input(s): AMMONIA in the last 168 hours. Coagulation Profile: No results for input(s): INR, PROTIME in the last 168 hours. Cardiac Enzymes: No results for input(s): CKTOTAL, CKMB, CKMBINDEX, TROPONINI in the last 168 hours. BNP (last 3 results) No results for input(s): PROBNP in the last 8760 hours. HbA1C: No results for input(s): HGBA1C in the last 72 hours. CBG: Recent Labs  Lab 12/23/19 2215 12/23/19 2229 12/23/19 2258 12/24/19 0832 12/24/19 1155  GLUCAP 52*  59* 89 79 137*   Lipid Profile: No results for input(s): CHOL, HDL, LDLCALC, TRIG, CHOLHDL, LDLDIRECT in the last 72 hours. Thyroid Function Tests: No results for input(s): TSH, T4TOTAL, FREET4, T3FREE, THYROIDAB in the last 72 hours. Anemia Panel: Recent Labs    12/24/19 0734  FERRITIN 51   Sepsis Labs: Recent Labs  Lab 12/20/19 1621  PROCALCITON <0.10    No results found for this or any previous visit (from the past 240 hour(s)).       Radiology Studies: DG Chest 1 View  Result Date: 12/24/2019 CLINICAL DATA:  Cough. COVID-19 positive. EXAM: CHEST  1 VIEW COMPARISON:  12/20/2019 FINDINGS: The cardiac silhouette is upper limits of normal in size. There is persistent elevation of the  right hemidiaphragm. There is subtle irregular interstitial nodular thickening which is similar to the prior radiograph, however the small consolidative lung opacities on CT are not readily apparent radiographically. No new airspace consolidation, pleural effusion, or pneumothorax is identified. IMPRESSION: Subtle bilateral lung opacities which were much better demonstrated on the prior CT. No evidence of progressive pneumonia or overt edema. Electronically Signed   By: Logan Bores M.D.   On: 12/24/2019 11:10        Scheduled Meds: . amLODipine  10 mg Oral Daily  . aspirin EC  81 mg Oral Daily  . atorvastatin  40 mg Oral Daily  . docusate sodium  100 mg Oral BID  . enoxaparin (LOVENOX) injection  40 mg Subcutaneous Q24H  . fluticasone  1 spray Each Nare Daily  . furosemide  40 mg Intravenous Q12H  . gabapentin  900 mg Oral BID  . insulin aspart  0-15 Units Subcutaneous TID WC  . insulin aspart  0-5 Units Subcutaneous QHS  . insulin aspart protamine- aspart  55 Units Subcutaneous BID WC  . metoprolol  200 mg Oral Daily  . mometasone-formoterol  2 puff Inhalation BID  . pantoprazole  40 mg Oral Daily  . tamsulosin  0.4 mg Oral Daily  . venlafaxine XR  150 mg Oral Q breakfast   Continuous Infusions:   LOS: 3 days    Time spent: 30 minutes    Edvardo Honse Darleen Crocker, DO [facility] Pager [phone]  If 7PM-7AM, please contact night-coverage [email or web address] Password [email or web address] 12/24/2019, 1:16 PM

## 2019-12-24 NOTE — Progress Notes (Signed)
Returned to pt room for scheduled med and to see if he was ready to shower. Pt sleeping, snoring, Awakened to name called. Denies c/o, has eaten peanut butter crackers and diet soda that I brought in one hour ago. Urinal sitting on bedside table noted to have 654ml of clear yellow urine. Pt states, "I am so sleepy. Can I just keep resting for a while?" Pt also did not want to take his scheduled 1400 colace at this time, states, "I'll take it later after my nap." Bloodsugar rechecked, results of 167 mg/dl obtained. Skin warm and dry, color pink. Resp even and non-labored, snoring at times. SaO2 94-97% while sleeping on room air.

## 2019-12-25 LAB — GLUCOSE, CAPILLARY
Glucose-Capillary: 150 mg/dL — ABNORMAL HIGH (ref 70–99)
Glucose-Capillary: 107 mg/dL — ABNORMAL HIGH (ref 70–99)

## 2019-12-25 MED ORDER — POTASSIUM CHLORIDE ER 10 MEQ PO TBCR: 10.0000 meq | EXTENDED_RELEASE_TABLET | Freq: Every day | ORAL | 2 refills | Status: DC

## 2019-12-25 MED ORDER — DOCUSATE SODIUM 100 MG PO CAPS: 100.0000 mg | ORAL_CAPSULE | Freq: Two times a day (BID) | ORAL | 0 refills | Status: DC

## 2019-12-25 MED ORDER — POLYETHYLENE GLYCOL 3350 17 G PO PACK: 17.0000 g | PACK | Freq: Every day | ORAL | 0 refills | Status: DC | PRN

## 2019-12-25 MED ORDER — INSULIN ASPART PROT & ASPART (70-30 MIX) 100 UNIT/ML ~~LOC~~ SUSP: 55.0000 [IU] | Freq: Two times a day (BID) | SUBCUTANEOUS | 11 refills | Status: DC

## 2019-12-25 MED ORDER — FUROSEMIDE 20 MG PO TABS: 20.0000 mg | ORAL_TABLET | Freq: Every day | ORAL | 11 refills | Status: DC

## 2019-12-25 NOTE — Discharge Summary (Signed)
Physician Discharge Summary  [name] [id number] DOB: 07-13-1961 DOA: 12/20/2019  PCP: [name], MD  Admit date: 12/20/2019  Discharge date: 12/25/2019  Admitted From:Home  Disposition:  Home  Recommendations for Outpatient Follow-up:  1. Follow up with PCP in 1-2 weeks 2. Please obtain BMP in one week 3. Continue to check daily weights at home and continue on Lasix 20 mg daily as prescribed with extra 20 mg if weight gain of 3 pounds or greater in 24-hour timeframe.  This was explained to the patient. 4. Continue home medications as prior and decrease 70/30 insulin dose to 55 units twice daily and monitor blood glucose carefully at home  Home Health: None  Equipment/Devices: None  Discharge Condition: Stable  CODE STATUS: Full  Diet recommendation: Heart Healthy/carb modified  Brief/Interim Summary: Per HPI: Shane Chambers a 59 y.o.malewith medical history significant fordiabetes mellitus, hypertension, depression, BPH,recent Covid pneumonia.  Patient presented to the ED with complaints of worsening difficulty breathing, cough. Admitted for COVID-19 pneumonia 1/21- 1/26-with acute respiratory failure requiring 2 L O2,completed full course of remdesivir and steroids, inflammatory markers improved with treatment andhewas discharge on room air. He tells me over the past 5 days his breathing significantly worsened. He is also still coughing. Denies fever or chills. No chest pain. He is unaware if he has gained weight as he has not been checking his weight, but he reports that his abdomen feels bloated, he has lower extremity swelling but he thinksthis is mostly chronic.  Also hospitalization 1/5-1/7 for acute bronchitis.  2/10: Patient was admitted with acute hypoxemic respiratory failure suspected to be related to acute pulmonary edema and volume overload. 2D echocardiogram has been performed with results demonstrating LVEF 50-55% and no other acute  findings. His BNP was elevated at 264 and his weight has increased from 280 pounds to 302 pounds. Procalcitonin negative. He is currently on isolation for recent Covid pneumonia and received 1 dose of Solu-Medrol.  2/11:Patient is slowly improving and weights have not been documented. He has a -5.8 L net negative fluid balance thus far continues to have high urine output with Lasix twice daily. Creatinine is starting to slowly trend upwards and he may be near his euvolemic state. Continue to wean oxygen. Patient reports that he was taken off of his chlorthalidone during his recent admission and feels that this could have contributed to his weight gain.  2/12:Patient continues to diurese quite well with over 3 L every 24 hours. Creatinine appears to remain stable and weight is steadily decreasing. We will continue current trend as he is slowly improving. He still has clinical signs of volume overload with pitting edema and remains on 2 L nasal cannula oxygen.  2/13: Patient continues to remain on 2 L nasal cannula oxygen and diuresis well.  He is still quite volume overloaded and has a weight of 288 pounds, but this is downtrending.  Chest x-ray with no significant findings and some improvement noted today.  He has no elevation of his inflammatory markers.  Will start him on some Dulera to assist with his cough.  He was noted to have some periods of hypoglycemia for which insulin coverage has been decreased.  2/14: Patient has remained off of nasal cannula oxygen since here yesterday afternoon.  He is overall doing well and his weight has decreased to 287 pounds.  He has diuresed approximately 12.5 L of fluid and appears to have no further edema or volume overload noted.  He is  doing well and has good appetite.  He will be started on Lasix 20 mg daily and will add another 20 mg daily for any weight gain noted on his scale at home.  He was noted to have episodes of hypoglycemia for which his  home Lasix dose has been decreased.  Notably, his hemoglobin A1c was 9.8% while here and he will need close follow-up with his primary care physician.  2D echocardiogram results as noted above.  Discharge Diagnoses:  Active Problems:   Hypertension   Type 2 diabetes mellitus ([place])   Depression   Acute respiratory failure (HCC)   Acute respiratory failure with hypoxia (HCC)  Principal discharge diagnosis: Acute hypoxemic respiratory failure secondary to pulmonary edema.  Recent COVID-19 pneumonia.  Discharge Instructions  Discharge Instructions    Diet - low sodium heart healthy   Complete by: As directed    Increase activity slowly   Complete by: As directed      Allergies as of 12/25/2019      Reactions   Ace Inhibitors Swelling, Cough   Other Itching   [name]   Penicillins    Has patient had a PCN reaction causing immediate rash, facial/tongue/throat swelling, SOB or lightheadedness with hypotension: Unknown Has patient had a PCN reaction causing severe rash involving mucus membranes or skin necrosis: Unknown Has patient had a PCN reaction that required hospitalization: Unknown Has patient had a PCN reaction occurring within the last 10 years: Unknown If all of the above answers are "NO", then may proceed with Cephalosporin use.      Medication List    STOP taking these medications   ascorbic acid 500 MG tablet Commonly known as: VITAMIN C     TAKE these medications   albuterol 108 (90 Base) MCG/ACT inhaler Commonly known as: VENTOLIN HFA Inhale 1-2 puffs into the lungs every 6 (six) hours as needed for wheezing or shortness of breath.   amLODipine 10 MG tablet Commonly known as: NORVASC Take 10 mg by mouth daily.   aspirin 81 MG EC tablet Take 81 mg by mouth daily.   atorvastatin 40 MG tablet Commonly known as: LIPITOR Take 40 mg by mouth daily.   docusate sodium 100 MG capsule Commonly known as: COLACE Take 1 capsule (100 mg total) by mouth 2 (two)  times daily.   fluticasone 27.5 MCG/SPRAY nasal spray Commonly known as: VERAMYST Place 2 sprays into the nose daily as needed for rhinitis or allergies.   furosemide 20 MG tablet Commonly known as: Lasix Take 1 tablet (20 mg total) by mouth daily. Take an extra 20mg  by mouth if noted to have greater than 3 lb weight gain in 24 hours.   gabapentin 300 MG capsule Commonly known as: NEURONTIN Take 900 mg by mouth 2 (two) times daily.   indomethacin 25 MG capsule Commonly known as: INDOCIN Take 25 mg by mouth 2 (two) times daily as needed for mild pain or moderate pain.   insulin aspart protamine- aspart (70-30) 100 UNIT/ML injection Commonly known as: NovoLOG Mix 70/30 Inject 0.55 mLs (55 Units total) into the skin 2 (two) times daily with a meal. What changed: how much to take   Iron (Ferrous Sulfate) 325 (65 Fe) MG Tabs Take 1 tablet by mouth daily.   metFORMIN 1000 MG tablet Commonly known as: GLUCOPHAGE Take 1,000 mg by mouth 2 (two) times daily with a meal.   metoprolol 200 MG 24 hr tablet Commonly known as: TOPROL-XL Take 200 mg by mouth  daily.   omeprazole 40 MG capsule Commonly known as: PRILOSEC Take 40 mg by mouth daily.   polyethylene glycol 17 g packet Commonly known as: MIRALAX / GLYCOLAX Take 17 g by mouth daily as needed for mild constipation.   Tab-A-Vite Tabs Take 1 tablet by mouth daily.   tamsulosin 0.4 MG Caps capsule Commonly known as: FLOMAX Take 0.4 mg by mouth daily.   venlafaxine XR 150 MG 24 hr capsule Commonly known as: EFFEXOR-XR Take 150 mg by mouth daily with breakfast.   Vitamin D (Ergocalciferol) 1.25 MG (50000 UNIT) Caps capsule Commonly known as: DRISDOL Take 1 capsule (50,000 Units total) by mouth 2 (two) times a week.      Follow-up Information    Vyas, Dhruv B, MD Follow up in 1 week(s).   Specialty: Internal Medicine Contact information: [place] [address] [phone]          Allergies  Allergen  Reactions  . Ace Inhibitors Swelling and Cough  . Other Itching    [name]  . Penicillins     Has patient had a PCN reaction causing immediate rash, facial/tongue/throat swelling, SOB or lightheadedness with hypotension: Unknown Has patient had a PCN reaction causing severe rash involving mucus membranes or skin necrosis: Unknown Has patient had a PCN reaction that required hospitalization: Unknown Has patient had a PCN reaction occurring within the last 10 years: Unknown If all of the above answers are "NO", then may proceed with Cephalosporin use.     Consultations:  None   Procedures/Studies: DG Chest 1 View  Result Date: 12/24/2019 CLINICAL DATA:  Cough. COVID-19 positive. EXAM: CHEST  1 VIEW COMPARISON:  12/20/2019 FINDINGS: The cardiac silhouette is upper limits of normal in size. There is persistent elevation of the right hemidiaphragm. There is subtle irregular interstitial nodular thickening which is similar to the prior radiograph, however the small consolidative lung opacities on CT are not readily apparent radiographically. No new airspace consolidation, pleural effusion, or pneumothorax is identified. IMPRESSION: Subtle bilateral lung opacities which were much better demonstrated on the prior CT. No evidence of progressive pneumonia or overt edema. Electronically Signed   By: Logan Bores M.D.   On: 12/24/2019 11:10   CT Angio Chest PE W and/or Wo Contrast  Result Date: 12/20/2019 CLINICAL DATA:  Shortness of breath, COVID-19 positive.  Cough. EXAM: CT ANGIOGRAPHY CHEST WITH CONTRAST TECHNIQUE: Multidetector CT imaging of the chest was performed using the standard protocol during bolus administration of intravenous contrast. Multiplanar CT image reconstructions and MIPs were obtained to evaluate the vascular anatomy. CONTRAST:  154mL OMNIPAQUE IOHEXOL 350 MG/ML SOLN COMPARISON:  None. 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 Heart size normal.  No pericardial effusion. Fair contrast opacification of pulmonary artery branches with no convincing central pulmonary emboli. Limited evaluation of segmental and subsegmental branches due to poor bolus timing and continued patient breathing motion during the acquisition. Extensive coronary calcifications. Adequate contrast opacification of the thoracic aorta with no evidence of dissection, aneurysm, or stenosis. There is bovine variant brachiocephalic arch anatomy without proximal stenosis. Aortic Atherosclerosis (ICD10-170.0). Mediastinum/Nodes: 1.7 cm enlarged pericardial lymph node. Prominent subcentimeter right paratracheal and subcarinal nodes. Lungs/Pleura: No ascites. No free air. Peripheral septal thickening throughout both lungs. Patchy areas of airspace consolidation in the anterior right upper lobe. Smaller scattered airspace opacities in the  lower lobes. Upper Abdomen: No acute findings. Musculoskeletal: Anterior vertebral endplate spurring at multiple levels in the mid and lower thoracic spine. Review of the MIP images confirms the above findings. IMPRESSION: 1. No convincing pulmonary emboli, with limitations as above. 2. Bilateral interstitial and airspace opacities as above, may represent atypical edema vs infectious/inflammatory etiology. 3. Coronary and Aortic Atherosclerosis (ICD10-170.0). Electronically Signed   By: Lucrezia Europe M.D.   On: 12/20/2019 15:18   DG Chest Port 1 View  Result Date: 12/20/2019 CLINICAL DATA:  Shortness of breath. EXAM: PORTABLE CHEST 1 VIEW COMPARISON:  December 01, 2019. FINDINGS: Stable cardiomegaly. No pneumothorax or pleural effusion is noted. Elevated right hemidiaphragm is noted with minimal right basilar subsegmental atelectasis. Left lung is clear. Bony thorax is unremarkable. IMPRESSION: Elevated right  hemidiaphragm with minimal right basilar subsegmental atelectasis. Electronically Signed   By: Marijo Conception M.D.   On: 12/20/2019 12:38   DG Chest Port 1 View  Result Date: 12/01/2019 CLINICAL DATA:  Cough and shortness of breath, worsened over the past week and a half. EXAM: PORTABLE CHEST 1 VIEW COMPARISON:  11/15/2019 FINDINGS: Unchanged elevation of right hemidiaphragm. Minimal patchy opacities in the basilar periphery of both lungs, new from prior. Unchanged heart size and mediastinal contours. No pneumothorax, large pleural effusion or pulmonary edema. No acute osseous abnormalities. IMPRESSION: Minimal patchy bibasilar opacities, new from prior, may be atelectasis or pneumonia. Electronically Signed   By: Keith Rake M.D.   On: 12/01/2019 01:29   ECHOCARDIOGRAM COMPLETE  Result Date: 12/21/2019    ECHOCARDIOGRAM REPORT   Patient Name:   Shane Chambers Date of Exam: 12/21/2019 Medical Rec #:  [id number]       Height:       69.0 in Accession #:    [id number]      Weight:       302.0 lb Date of Birth:  10-02-1961       BSA:          2.46 m Patient Age:    74 years        BP:           135/80 mmHg Patient Gender: M               HR:           91 bpm. Exam Location:  [name] Procedure: 2D Echo Indications:    Dyspnea 786.09 / R06.00  History:        Patient has no prior history of Echocardiogram examinations.                 Risk Factors:Hypertension, Diabetes, Dyslipidemia and Former                 Smoker. COVID Positive, Acute respiratory failure , Pneumonia                 due to COVID-19 virus, Sepsis.  Sonographer:    [name] RDCS (AE) Referring Phys: [place]  1. Left ventricular ejection fraction, by estimation, is 50 to 55%. The left ventricle has low normal function. The left ventrical has no regional wall motion abnormalities. There is moderately increased left ventricular hypertrophy. Left ventricular diastolic parameters are indeterminate.  2.  Right ventricular systolic function is normal. The right ventricular size is normal.  3. The mitral valve is normal in structure and function. trivial mitral valve regurgitation. No evidence of mitral stenosis.  4. The aortic valve  is tricuspid. Aortic valve regurgitation is not visualized. Mild aortic valve stenosis.  5. The inferior vena cava is normal in size with greater than 50% respiratory variability, suggesting right atrial pressure of 3 mmHg. FINDINGS  Left Ventricle: Left ventricular ejection fraction, by estimation, is 50 to 55%. The left ventricle has low normal function. The left ventricle has no regional wall motion abnormalities. There is moderately increased left ventricular hypertrophy. Left ventricular diastolic parameters are indeterminate. Right Ventricle: The right ventricular size is normal. No increase in right ventricular wall thickness. Right ventricular systolic function is normal. Left Atrium: Left atrial size was normal in size. Right Atrium: Right atrial size was normal in size. Pericardium: There is no evidence of pericardial effusion. Mitral Valve: The mitral valve is normal in structure and function. Trivial mitral valve regurgitation. No evidence of mitral valve stenosis. Tricuspid Valve: The tricuspid valve is normal in structure. Tricuspid valve regurgitation is not demonstrated. No evidence of tricuspid stenosis. Aortic Valve: The aortic valve is tricuspid. . There is mild thickening and mild calcification of the aortic valve. Aortic valve regurgitation is not visualized. Mild aortic stenosis is present. Mild aortic valve annular calcification. There is mild thickening of the aortic valve. There is mild calcification of the aortic valve. Aortic valve mean gradient measures 10.5 mmHg. Aortic valve peak gradient measures 16.3 mmHg. Aortic valve area, by VTI measures 1.93 cm. Pulmonic Valve: The pulmonic valve was not well visualized. Pulmonic valve regurgitation is not visualized.  No evidence of pulmonic stenosis. Aorta: The aortic root is normal in size and structure. Pulmonary Artery: Indeterminant PASP, inadequate TR jet.  Venous: The inferior vena cava is normal in size with greater than 50% respiratory variability, suggesting right atrial pressure of 3 mmHg. IAS/Shunts: No atrial level shunt detected by color flow Doppler.  LEFT VENTRICLE PLAX 2D LVIDd:         4.93 cm  Diastology LVIDs:         3.82 cm  LV e' lateral:   11.30 cm/s LV PW:         1.30 cm  LV E/e' lateral: 7.9 LV IVS:        1.30 cm  LV e' medial:    7.83 cm/s LVOT diam:     2.30 cm  LV E/e' medial:  11.3 LV SV:         87.98 ml LV SV Index:   19.49 LVOT Area:     4.15 cm  RIGHT VENTRICLE RV S prime:     17.30 cm/s TAPSE (M-mode): 2.2 cm LEFT ATRIUM             Index       RIGHT ATRIUM           Index LA diam:        4.50 cm 1.83 cm/m  RA Area:     14.20 cm LA Vol (A2C):   70.4 ml 28.60 ml/m RA Volume:   41.60 ml  16.90 ml/m LA Vol (A4C):   64.6 ml 26.24 ml/m LA Biplane Vol: 71.6 ml 29.09 ml/m  AORTIC VALVE AV Area (Vmax):    1.91 cm AV Area (Vmean):   1.63 cm AV Area (VTI):     1.93 cm AV Vmax:           202.02 cm/s AV Vmean:          154.600 cm/s AV VTI:            0.456 m AV  Peak Grad:      16.3 mmHg AV Mean Grad:      10.5 mmHg LVOT Vmax:         92.90 cm/s LVOT Vmean:        60.838 cm/s LVOT VTI:          0.212 m LVOT/AV VTI ratio: 0.46  AORTA Ao Root diam: 3.20 cm MITRAL VALVE MV Area (PHT): 4.89 cm             SHUNTS MV Decel Time: 155 msec             Systemic VTI:  0.21 m MV E velocity: 88.80 cm/s 103 cm/s  Systemic Diam: 2.30 cm MV A velocity: 83.90 cm/s 70.3 cm/s MV E/A ratio:  1.06       1.5 Carlyle Dolly MD Electronically signed by Carlyle Dolly MD Signature Date/Time: 12/21/2019/12:45:46 PM    Final      Discharge Exam: Vitals:   12/25/19 0426 12/25/19 0751  BP: 129/61   Pulse: 67   Resp: 16   Temp:    SpO2: 94% 94%   Vitals:   12/24/19 2046 12/24/19 2058 12/25/19 0426 12/25/19  0751  BP:  132/70 129/61   Pulse:  67 67   Resp:  20 16   Temp:  97.7 F (36.5 C)    TempSrc:  Oral    SpO2: 98% 97% 94% 94%  Weight:   130.5 kg   Height:        General: Pt is alert, awake, not in acute distress Cardiovascular: RRR, S1/S2 +, no rubs, no gallops Respiratory: CTA bilaterally, no wheezing, no rhonchi Abdominal: Soft, NT, ND, bowel sounds + Extremities: no edema, no cyanosis    The results of significant diagnostics from this hospitalization (including imaging, microbiology, ancillary and laboratory) are listed below for reference.     Microbiology: No results found for this or any previous visit (from the past 240 hour(s)).   Labs: BNP (last 3 results) Recent Labs    12/01/19 0148 12/01/19 1846 12/20/19 1230  BNP 93.0 167.0* [id number]*   Basic Metabolic Panel: Recent Labs  Lab 12/20/19 1230 12/21/19 0627 12/22/19 0638 12/23/19 0428 12/24/19 0734  NA 136 133* 134* 137 138  K 3.9 3.9 3.6 3.9 3.6  CL 98 94* 96* 95* 94*  CO2 25 27 29 30 31   GLUCOSE 389* 382* 149* 76 89  BUN 16 15 24* 25* 20  CREATININE 0.82 0.73 0.96 0.85 0.84  CALCIUM 8.5* 8.6* 8.7* 9.1 9.2  MG  --   --  1.5* 1.6* 1.7   Liver Function Tests: Recent Labs  Lab 12/20/19 1230  AST 23  ALT 23  ALKPHOS 93  BILITOT 0.6  PROT 6.3*  ALBUMIN 3.0*   No results for input(s): LIPASE, AMYLASE in the last 168 hours. No results for input(s): AMMONIA in the last 168 hours. CBC: Recent Labs  Lab 12/20/19 1230 12/21/19 0627 12/24/19 0734  WBC 11.2* 12.1* 12.6*  NEUTROABS 7.2 10.5*  --   HGB 11.1* 11.9* 12.4*  HCT 35.2* 37.1* 40.2  MCV 88.7 86.7 88.9  PLT 274 278 263   Cardiac Enzymes: No results for input(s): CKTOTAL, CKMB, CKMBINDEX, TROPONINI in the last 168 hours. BNP: Invalid input(s): POCBNP CBG: Recent Labs  Lab 12/24/19 1418 12/24/19 1512 12/24/19 1633 12/24/19 2101 12/25/19 0814  GLUCAP 162* 167* 146* 135* 150*   D-Dimer Recent Labs    12/24/19 0734  DDIMER  0.28   Hgb  A1c No results for input(s): HGBA1C in the last 72 hours. Lipid Profile No results for input(s): CHOL, HDL, LDLCALC, TRIG, CHOLHDL, LDLDIRECT in the last 72 hours. Thyroid function studies No results for input(s): TSH, T4TOTAL, T3FREE, THYROIDAB in the last 72 hours.  Invalid input(s): FREET3 Anemia work up Recent Labs    12/24/19 0734  FERRITIN 51   Urinalysis    Component Value Date/Time   COLORURINE STRAW (A) 11/15/2019 2337   APPEARANCEUR CLEAR 11/15/2019 2337   LABSPEC 1.008 11/15/2019 2337   PHURINE 7.0 11/15/2019 2337   GLUCOSEU NEGATIVE 11/15/2019 2337   HGBUR MODERATE (A) 11/15/2019 2337   [place] NEGATIVE 11/15/2019 2337   [place] 11/15/2019 2337   PROTEINUR 100 (A) 11/15/2019 2337   NITRITE NEGATIVE 11/15/2019 2337   LEUKOCYTESUR NEGATIVE 11/15/2019 2337   Sepsis Labs Invalid input(s): PROCALCITONIN,  WBC,  LACTICIDVEN Microbiology No results found for this or any previous visit (from the past 240 hour(s)).   Time coordinating discharge: 35 minutes  SIGNED:   Rodena Goldmann, DO [facility] 12/25/2019, 8:59 AM  If 7PM-7AM, please contact night-coverage [email or web address]

## 2019-12-25 NOTE — Progress Notes (Signed)
Pt discharged via wheelchair to POV. All belongings with patient.

## 2020-01-02 ENCOUNTER — Ambulatory Visit (HOSPITAL_COMMUNITY): Payer: Medicaid Other | Admitting: Physical Therapy

## 2020-01-02 ENCOUNTER — Other Ambulatory Visit: Payer: Self-pay

## 2020-01-02 ENCOUNTER — Encounter (HOSPITAL_COMMUNITY): Payer: Self-pay | Admitting: Physical Therapy

## 2020-01-02 DIAGNOSIS — R262 Difficulty in walking, not elsewhere classified: Secondary | ICD-10-CM

## 2020-01-02 DIAGNOSIS — M6281 Muscle weakness (generalized): Secondary | ICD-10-CM | POA: Diagnosis present

## 2020-01-03 NOTE — Therapy (Addendum)
[place], [place], [address] Phone: [phone]   Fax:  [phone]  Physical Therapy Evaluation  Patient Details  Name: Shane Chambers MRN: [id number] Date of Birth: 10-09-1961 Referring Provider (PT): Irwin Brakeman    Encounter Date: 01/02/2020  PT End of Session - 01/02/20 1509    Visit Number  1    Number of Visits  12    Date for PT Re-Evaluation  02/20/20   PT will not start until next week due to insurance   Authorization Type  medicaid    Authorization - Visit Number  1    Authorization - Number of Visits  4   not authorized yet but put in for medicaid   PT Start Time  1400    PT Stop Time  1445    PT Time Calculation (min)  45 min    Activity Tolerance  Patient limited by fatigue;Treatment limited secondary to medical complications (Comment)    Behavior During Therapy  [facility] for tasks assessed/performed       Past Medical History:  Diagnosis Date  . Depression   . Diabetes mellitus without complication ([place])   . Hyperlipidemia 12/01/2019  . Hypertension   . Type 2 diabetes mellitus ([place])     Past Surgical History:  Procedure Laterality Date  . DENTAL SURGERY      There were no vitals filed for this visit.   Subjective Assessment - 01/02/20 1405    Subjective  Shane Chambers states that he came down with VOVID in January and was hospitalized for several days.  He was referred for skilled out pt Pt after this referral.  He never got back to normal and ended up being hopitalized a second time from 2/10 thru 2/14.  He is having difficulty walking and doing any functional activity.  He has been using a walker ever since the summer due to being unstable on his feet.    Pertinent History  HTN, DM , DDD, HTN chronic bronchitis. past smoker.    Limitations  Walking;Standing;Lifting    How long can you sit comfortably?  no problem    How long can you walk comfortably?  one minute  and then he wants to  sit down, walks with a walker    Patient Stated Goals  To see if he can improve his ability to walk.    Currently in Pain?  Yes    Pain Score  4     Pain Location  Leg    Pain Orientation  Right;Left    Pain Descriptors / Indicators  Aching    Pain Type  Chronic pain    Pain Radiating Towards  B LE    Pain Onset  More than a month ago    Pain Frequency  Constant    Aggravating Factors   activity    Pain Relieving Factors  rest    Effect of Pain on Daily Activities  limits    Multiple Pain Sites  --   all joints/ back           01/02/20 0001  Assessment  Medical Diagnosis Difficulty in walking, weakness  Referring Provider (PT) Woody Seller, Dhruv   Onset Date/Surgical Date 12/01/19  Prior Therapy none  Precautions  Precautions Fall  Restrictions  Weight Bearing Restrictions No  Balance Screen  Has the patient fallen in the past 6 months No  Has the patient had a decrease in activity level because  of a fear of falling?  Yes  Is the patient reluctant to leave their home because of a fear of falling?  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  Prior Function  Level of Independence Independent  Cognition  Overall Cognitive Status Within Functional Limits for tasks assessed  Functional Tests  Functional tests Single leg stance;Sit to Stand  Single Leg Stance  Comments unable   Sit to Stand  Comments unable to come sit to stand without using UE   ROM / Strength  AROM / PROM / Strength Strength  Strength  Overall Strength Comments tested sitting   Strength Assessment Site Hip;Knee;Ankle  Right/Left Hip Right;Left  Right/Left Knee Right;Left  Right/Left Ankle Right;Left  Right Hip Flexion 4/5  Right Hip Extension 2+/5  Right Hip ABduction 4-/5  Right Hip ADduction 3+/5  Left Hip Flexion 4/5  Left Hip Extension 2-/5  Left Hip ABduction 4-/5  Left Hip ADduction 3+/5  Right Knee Flexion 4-/5  Right Knee Extension 5/5  Left Knee Flexion 4-/5  Left Knee  Extension 5/5  Right Ankle Dorsiflexion 3-/5  Right Ankle Plantar Flexion 2-/5  Left Ankle Dorsiflexion 3/5  Left Ankle Plantar Flexion 2-/5            01/02/20 0001  Exercises  Exercises Knee/Hip  Knee/Hip Exercises: Seated  Other Seated Knee/Hip Exercises diaphragmic breathing; ankle pumps, hip ab/ad, LAQ, marching x 10                    PT Education - 01/02/20 1508    Education Details  HEP    Person(s) Educated  Patient    Methods  Explanation;Demonstration;Tactile cues;Verbal cues;Handout    Comprehension  Verbalized understanding;Returned demonstration       PT Short Term Goals - 01/02/20 1415      PT SHORT TERM GOAL #1   Title  PT to be able to walk with his walker for 5 minutes and 500' without having to sit down.    Time  4   Pt will not start for a week due to authorization   Period  Weeks    Status  New    [place] Date  01/31/20      PT SHORT TERM GOAL #2   Title  Pt to be able to stand without UE assist for ten minutes in order to make a small meal ie sandwich.    Time  4    Period  Weeks    Status  New      PT SHORT TERM GOAL #3   Title  Pt to be able  to single leg stance for ten seconds to reduce the risk of falling    Time  4    Period  Weeks    Status  New        PT Long Term Goals - 01/03/20 [id number]      PT LONG TERM GOAL #1   Title  PT to be able to walk with walker for 15 minutes in order to be able to make quick shopping trips    Time  6    Period  Weeks    Status  New    Target Date  02/21/20   PT will not start treatment until 3/1 due to needing authorization     PT LONG TERM GOAL #2   Title  PT to be able to stand for 20 minute to be able to socialize, make a small  meal    Time  6    Period  Weeks    Status  New      PT LONG TERM GOAL #3   Title  PT to feel confident walking in his home with a cane    Time  6    Period  Weeks    Status  New      PT LONG TERM GOAL #4   Title  PT to be able to single leg  stance for 20 seconds to reduce risk of falling    Time  6    Period  Weeks    Status  New      PT LONG TERM GOAL #5   Title  PT gluteal mm and core strength to be increased one grade to be able to come sit to stand without UE assist.    Time  6    Period  Weeks    Status  New             Plan - 01/02/20 1511    Clinical Impression Statement  Shane Chambers is a 59 yo male who has been having declining functional ability for the past year.  He was using a cane, went to a walker this summer and then contracted COVID in January and has been in the hospital twice since January.  He states nobody really has given him an explaination as far as why he was declining functionally pre COVID.  He feels that he has something neurologically going on that has not yet been determined.  He has been sent to skilled OP PT to see if his functional ability can be improved.  At this time Shane Chambers states that he requires to sit down after ambulating with a walker for a minute,(20 ft).  He tends to use accessory mm to breath, has decreased ankle and hip strength as well as decreased balance.  Shane Chambers will benefit from skilled PT to address these issues and maximize his functional ability.    Personal Factors and Comorbidities  Fitness;Past/Current Experience;Time since onset of injury/illness/exacerbation    Examination-Activity Limitations  Carry;Lift;Dressing;Locomotion Level;Stairs    Examination-Participation Restrictions  Church;Cleaning;Community Activity;Laundry;Shop;Yard Work    [employer]chant [employer] officer  Evolving/Moderate complexity    Clinical Decision Making  Moderate    Rehab Potential  Fair    PT Frequency  2x / week    PT Duration  6 weeks    PT Treatment/Interventions  ADLs/Self Care Home Management;Gait training;DME Instruction;Stair training;Functional mobility training;Therapeutic activities;Therapeutic exercise;Balance training;Patient/family education;Neuromuscular re-education     PT Next Visit Plan  If pt has obtained wheels on walker give pt tennis balls to put on the back legs, Continue to cue for diapharagmic breathing during functional tasks, walk around department, work on heel raises , marching, functional squats, lunges    PT Home Exercise Plan  eval:  diapharagmic breathing, ankle pump, LAQ< hip ab/adduction, sitting marching, standing as long as possible       Patient will benefit from skilled therapeutic intervention in order to improve the following deficits and impairments:  Abnormal gait, Cardiopulmonary status limiting activity, Decreased activity tolerance, Decreased balance, Decreased coordination, Decreased safety awareness, Difficulty walking, Decreased strength, Impaired perceived functional ability  Visit Diagnosis: Difficulty in walking, not elsewhere classified - Plan: PT plan of care cert/re-cert  Muscle weakness (generalized) - Plan: PT plan of care cert/re-cert     Problem List Patient Active Problem List   Diagnosis Date Noted  .  Acute respiratory failure with hypoxia ([place]) 12/21/2019  . Acute respiratory failure ([place]) 12/20/2019  . Severe Vitamin D deficiency 12/02/2019  . CAP (community acquired pneumonia) 12/01/2019  . Hypokalemia 12/01/2019  . Hyperlipidemia 12/01/2019  . BPH (benign prostatic hyperplasia) 12/01/2019  . Normocytic anemia 12/01/2019  . Pneumonia due to COVID-19 virus 12/01/2019  . Hypoxia   . SOB (shortness of breath) 11/16/2019  . Sepsis ([place]) 11/16/2019  . Hypertension   . Type 2 diabetes mellitus ([place])   . Depression   . Acute bronchitis with bronchospasm 11/15/2019    Rayetta Humphrey, PT CLT [phone] 01/03/2020, 8:44 AM  [facility] [address] [place], [place], [address] Phone: [phone]   Fax:  [phone]  Name: ELCHANAN DALESANDRO MRN: [id number] Date of Birth: Nov 02, 1961

## 2020-01-05 ENCOUNTER — Encounter (HOSPITAL_COMMUNITY): Payer: Medicaid Other

## 2020-01-10 ENCOUNTER — Other Ambulatory Visit: Payer: Self-pay

## 2020-01-10 ENCOUNTER — Encounter (HOSPITAL_COMMUNITY): Payer: Self-pay

## 2020-01-10 ENCOUNTER — Ambulatory Visit (HOSPITAL_COMMUNITY): Payer: Medicaid Other | Attending: Family Medicine

## 2020-01-10 DIAGNOSIS — R262 Difficulty in walking, not elsewhere classified: Secondary | ICD-10-CM

## 2020-01-10 DIAGNOSIS — M6281 Muscle weakness (generalized): Secondary | ICD-10-CM | POA: Insufficient documentation

## 2020-01-10 NOTE — Therapy (Signed)
[place], [place], [address] Phone: [phone]   Fax:  [phone]  Physical Therapy Treatment  Patient Details  Name: Shane Chambers MRN: [id number] Date of Birth: October 26, 1961 Referring Provider (PT): Harriett Sine   Encounter Date: 01/10/2020  PT End of Session - 01/10/20 1530    Visit Number  2    Number of Visits  12    Date for PT Re-Evaluation  02/20/20    Authorization Type  medicaid:  approved authorization 3 visits 3/1-->01/22/20    Authorization - Visit Number  2    Authorization - Number of Visits  4    Progress Note Due on Visit  4   Request further visits with Medicaid authorization   PT Start Time  [id number]    PT Stop Time  1608    PT Time Calculation (min)  45 min    Activity Tolerance  Patient limited by pain;Patient limited by fatigue;Patient tolerated treatment well    Behavior During Therapy  [facility] for tasks assessed/performed       Past Medical History:  Diagnosis Date  . Depression   . Diabetes mellitus without complication ([place])   . Hyperlipidemia 12/01/2019  . Hypertension   . Type 2 diabetes mellitus ([place])     Past Surgical History:  Procedure Laterality Date  . DENTAL SURGERY      There were no vitals filed for this visit.  Subjective Assessment - 01/10/20 1527    Subjective  Pt stated he has began some of the exercises at home, reports complaince with exercises for 4 days.  Reports he had difficulty standing.  Generalized pain BLE, pain scale 5/10    Pertinent History  HTN, DM , DDD, HTN chronic bronchitis. past smoker.    Patient Stated Goals  To see if he can improve his ability to walk.    Currently in Pain?  Yes    Pain Score  5     Pain Location  Leg    Pain Orientation  Right;Left    Pain Descriptors / Indicators  Aching;Sore    Pain Type  Chronic pain    Pain Radiating Towards  BLE    Pain Onset  More than a month ago    Pain Frequency  Constant    Aggravating  Factors   activity    Pain Relieving Factors  rest    Effect of Pain on Daily Activities  limits                       [facility] Adult PT Treatment/Exercise - 01/10/20 0001      Ambulation/Gait   Ambulation/Gait  Yes    Ambulation Distance (Feet)  20 Feet    Assistive device  Rolling walker    Gait Pattern  Step-through pattern;Decreased stride length;Trunk flexed    Ambulation Surface  Level;Indoor    Gait velocity  slow    Gait Comments  WC pushed behind      Exercises   Exercises  Knee/Hip      Knee/Hip Exercises: Standing   Heel Raises  Both;5 reps    Heel Raises Limitations  difficult    Forward Lunges  Both;5 reps    Forward Lunges Limitations  6in step with HHA    Functional Squat  5 reps    Functional Squat Limitations  cueing for mechanics    Other Standing Knee Exercises  alternating marching 5reps x 2  sets    Other Standing Knee Exercises         Knee/Hip Exercises: Seated   Other Seated Knee/Hip Exercises  diaphragmic breathing; ankle pumps, hip ab/ad, LAQ, marching x 10              PT Education - 01/10/20 1535    Education Details  Reviewed goals, educated and assured compliance with HEP; encouraged pt to increase compliance at home for maximal benfits    Person(s) Educated  Patient    Methods  Explanation    Comprehension  Verbalized understanding;Returned demonstration       PT Short Term Goals - 01/02/20 1415      PT SHORT TERM GOAL #1   Title  PT to be able to walk with his walker for 5 minutes and 500' without having to sit down.    Time  4   Pt will not start for a week due to authorization   Period  Weeks    Status  New    Target Date  01/31/20      PT SHORT TERM GOAL #2   Title  Pt to be able to stand without UE assist for ten minutes in order to make a small meal ie sandwich.    Time  4    Period  Weeks    Status  New      PT SHORT TERM GOAL #3   Title  Pt to be able  to single leg stance for ten seconds to reduce the  risk of falling    Time  4    Period  Weeks    Status  New        PT Long Term Goals - 01/03/20 [id number]      PT LONG TERM GOAL #1   Title  PT to be able to walk with walker for 15 minutes in order to be able to make quick shopping trips    Time  6    Period  Weeks    Status  New    Target Date  02/21/20   PT will not start treatment until 3/1 due to needing authorization     PT LONG TERM GOAL #2   Title  PT to be able to stand for 20 minute to be able to socialize, make a small meal    Time  6    Period  Weeks    Status  New      PT LONG TERM GOAL #3   Title  PT to feel confident walking in his home with a cane    Time  6    Period  Weeks    Status  New      PT LONG TERM GOAL #4   Title  PT to be able to single leg stance for 20 seconds to reduce risk of falling    Time  6    Period  Weeks    Status  New      PT LONG TERM GOAL #5   Title  PT gluteal mm and core strength to be increased one grade to be able to come sit to stand without UE assist.    Time  6    Period  Weeks    Status  New            Plan - 01/10/20 1800    Clinical Impression Statement  Reviewed goals, educated importance of compliance with HEP as well as assured  compliance.  Pt able to recall majority of exercises and demonstrate appropriate mechanics.  Session focus on generalized strengthening for LE in standing as well as gait training with RW.  Pt did not bring his RW with him this session, encouraged to purchase wheels and bring into dept next session.  Pt did required seated rest breaks through session due to fatigue.  Able to ambulate 10ft with RW prior fatigue, WC pushed behind for safety.  No LOB during gait, cueing for posture and increase stride length for energy conservation.    Personal Factors and Comorbidities  Fitness;Past/Current Experience;Time since onset of injury/illness/exacerbation    Examination-Activity Limitations  Carry;Lift;Dressing;Locomotion Level;Stairs     Examination-Participation Restrictions  Church;Cleaning;Community Activity;Laundry;Shop;Yard Work    [employer]chant [employer] officer  Evolving/Moderate complexity    Clinical Decision Making  Moderate    Rehab Potential  Fair    PT Frequency  2x / week    PT Duration  6 weeks    PT Treatment/Interventions  ADLs/Self Care Home Management;Gait training;DME Instruction;Stair training;Functional mobility training;Therapeutic activities;Therapeutic exercise;Balance training;Patient/family education;Neuromuscular re-education    PT Next Visit Plan  If pt has obtained wheels on walker give pt tennis balls to put on the back legs, Continue to cue for diapharagmic breathing during functional tasks, walk around department, work on heel raises , marching, functional squats, lunges    PT Home Exercise Plan  eval:  diapharagmic breathing, ankle pump, LAQ< hip ab/adduction, sitting marching, standing as long as possible       Patient will benefit from skilled therapeutic intervention in order to improve the following deficits and impairments:  Abnormal gait, Cardiopulmonary status limiting activity, Decreased activity tolerance, Decreased balance, Decreased coordination, Decreased safety awareness, Difficulty walking, Decreased strength, Impaired perceived functional ability  Visit Diagnosis: Muscle weakness (generalized)  Difficulty in walking, not elsewhere classified     Problem List Patient Active Problem List   Diagnosis Date Noted  . Acute respiratory failure with hypoxia ([place]) 12/21/2019  . Acute respiratory failure ([place]) 12/20/2019  . Severe Vitamin D deficiency 12/02/2019  . CAP (community acquired pneumonia) 12/01/2019  . Hypokalemia 12/01/2019  . Hyperlipidemia 12/01/2019  . BPH (benign prostatic hyperplasia) 12/01/2019  . Normocytic anemia 12/01/2019  . Pneumonia due to COVID-19 virus 12/01/2019  . Hypoxia   . SOB (shortness of breath) 11/16/2019  . Sepsis ([place]) 11/16/2019  .  Hypertension   . Type 2 diabetes mellitus ([place])   . Depression   . Acute bronchitis with bronchospasm 11/15/2019   Ihor Austin, LPTA/CLT; CBIS [phone]  Aldona Lento 01/10/2020, 6:07 PM  [facility] [address] [place], [place], [address] Phone: [phone]   Fax:  [phone]  Name: Shane Chambers MRN: [id number] Date of Birth: Mar 17, 1961

## 2020-01-12 ENCOUNTER — Other Ambulatory Visit: Payer: Self-pay

## 2020-01-12 ENCOUNTER — Encounter (HOSPITAL_COMMUNITY): Payer: Self-pay

## 2020-01-12 ENCOUNTER — Ambulatory Visit (HOSPITAL_COMMUNITY): Payer: Medicaid Other

## 2020-01-12 DIAGNOSIS — M6281 Muscle weakness (generalized): Secondary | ICD-10-CM

## 2020-01-12 DIAGNOSIS — R262 Difficulty in walking, not elsewhere classified: Secondary | ICD-10-CM

## 2020-01-12 NOTE — Therapy (Signed)
[facility] [address] [place], [place], [address] Phone: [phone]   Fax:  [phone]  Physical Therapy Treatment  Patient Details  Name: Shane Chambers MRN: [id number] Date of Birth: March 04, 1961 Referring Provider (PT): Harriett Sine   Encounter Date: 01/12/2020  PT End of Session - 01/12/20 1438    Visit Number  3    Number of Visits  12    Date for PT Re-Evaluation  02/20/20    Authorization Type  medicaid:  approved authorization 3 visits 3/1-->01/22/20    Authorization - Visit Number  3    Authorization - Number of Visits  4    Progress Note Due on Visit  4   Need request for Medicaid approved authorization next session   PT Start Time  1435    PT Stop Time  1520   Nustep at EOS x 33min; not included with charges   PT Time Calculation (min)  45 min    Equipment Utilized During Treatment  Gait belt   RW   Activity Tolerance  Patient limited by pain;Patient limited by fatigue;Patient tolerated treatment well    Behavior During Therapy  [facility] for tasks assessed/performed       Past Medical History:  Diagnosis Date  . Depression   . Diabetes mellitus without complication ([place])   . Hyperlipidemia 12/01/2019  . Hypertension   . Type 2 diabetes mellitus ([place])     Past Surgical History:  Procedure Laterality Date  . DENTAL SURGERY      There were no vitals filed for this visit.  Subjective Assessment - 01/12/20 1426    Subjective  Pt stated he spent a lot of time in car yesterday, didn't complete as many exercises due to timing.  Reports pain scale 5/10 BLE today.  Pt stated he is interested in aquatic therapy.    Pertinent History  HTN, DM , DDD, HTN chronic bronchitis. past smoker.    Patient Stated Goals  To see if he can improve his ability to walk.    Currently in Pain?  Yes    Pain Score  5     Pain Location  Leg    Pain Orientation  Right;Left    Pain Descriptors / Indicators  Aching;Sore    Pain Type   Chronic pain    Pain Radiating Towards  BLE    Pain Onset  More than a month ago    Pain Frequency  Constant    Aggravating Factors   activity    Pain Relieving Factors  rest    Effect of Pain on Daily Activities  limits                       [facility] Adult PT Treatment/Exercise - 01/12/20 0001      Knee/Hip Exercises: Aerobic   Nustep  5' hill level goal 50 SPM      Knee/Hip Exercises: Standing   Heel Raises  Both;5 reps    Heel Raises Limitations  difficult    Forward Lunges  Both;5 reps    Forward Lunges Limitations  6in step with HHA    Functional Squat  5 reps;2 sets    Functional Squat Limitations  cueing for mechanics    Gait Training  RW 2 sets 26 then 20 ft    Other Standing Knee Exercises  alternating marching 5reps x 2 sets    Other Standing Knee Exercises  standing tolerance for 1'30"  prior fatigue and need to sit; worked on deep breathing      Knee/Hip Exercises: Seated   Other Seated Knee/Hip Exercises  diaphramic breathing through session             PT Education - 01/12/20 1439    Education Details  Pt educated on aquatic therapy benefits    Person(s) Educated  Patient    Methods  Explanation    Comprehension  Verbalized understanding       PT Short Term Goals - 01/02/20 1415      PT SHORT TERM GOAL #1   Title  PT to be able to walk with his walker for 5 minutes and 500' without having to sit down.    Time  4   Pt will not start for a week due to authorization   Period  Weeks    Status  New    Target Date  01/31/20      PT SHORT TERM GOAL #2   Title  Pt to be able to stand without UE assist for ten minutes in order to make a small meal ie sandwich.    Time  4    Period  Weeks    Status  New      PT SHORT TERM GOAL #3   Title  Pt to be able  to single leg stance for ten seconds to reduce the risk of falling    Time  4    Period  Weeks    Status  New        PT Long Term Goals - 01/03/20 [id number]      PT LONG TERM GOAL #1    Title  PT to be able to walk with walker for 15 minutes in order to be able to make quick shopping trips    Time  6    Period  Weeks    Status  New    Target Date  02/21/20   PT will not start treatment until 3/1 due to needing authorization     PT LONG TERM GOAL #2   Title  PT to be able to stand for 20 minute to be able to socialize, make a small meal    Time  6    Period  Weeks    Status  New      PT LONG TERM GOAL #3   Title  PT to feel confident walking in his home with a cane    Time  6    Period  Weeks    Status  New      PT LONG TERM GOAL #4   Title  PT to be able to single leg stance for 20 seconds to reduce risk of falling    Time  6    Period  Weeks    Status  New      PT LONG TERM GOAL #5   Title  PT gluteal mm and core strength to be increased one grade to be able to come sit to stand without UE assist.    Time  6    Period  Weeks    Status  New            Plan - 01/12/20 1518    Clinical Impression Statement  Pt forgot his RW this session, strongly encouraged to buy wheel and bring into dept next session. Session focus on generalized strengthening for BLE in standing and gait training with RW.  Pt  continues to fatigue quickly requiring seated rest breaks throughout session, cueing to improve diaphramatic breathing.  1 episode of c/o lightheadedness, cueing to improve breathing and reports resolved.  Added Nustep at EOS for activity tolerance, pt able to keep SPM ~40.  Pt did state he would be interested in joining aquatic therapy, discussed benefits with aquatic therapist this session.    Personal Factors and Comorbidities  Fitness;Past/Current Experience;Time since onset of injury/illness/exacerbation    Examination-Activity Limitations  Carry;Lift;Dressing;Locomotion Level;Stairs    Examination-Participation Restrictions  Church;Cleaning;Community Activity;Laundry;Shop;Yard Work    [employer] officer  Evolving/Moderate complexity    Clinical  Decision Making  Moderate    Rehab Potential  Fair    PT Frequency  2x / week    PT Duration  6 weeks    PT Treatment/Interventions  ADLs/Self Care Home Management;Gait training;DME Instruction;Stair training;Functional mobility training;Therapeutic activities;Therapeutic exercise;Balance training;Patient/family education;Neuromuscular re-education    PT Next Visit Plan  Review goals and Medicaid authorization due next session.  If pt has obtained wheels on walker give pt tennis balls to put on the back legs, Continue to cue for diapharagmic breathing during functional tasks, walk around department, work on heel raises , marching, functional squats, lunges.  F/U with aquatic therapy.    PT Home Exercise Plan  eval:  diapharagmic breathing, ankle pump, LAQ< hip ab/adduction, sitting marching, standing as long as possible       Patient will benefit from skilled therapeutic intervention in order to improve the following deficits and impairments:  Abnormal gait, Cardiopulmonary status limiting activity, Decreased activity tolerance, Decreased balance, Decreased coordination, Decreased safety awareness, Difficulty walking, Decreased strength, Impaired perceived functional ability  Visit Diagnosis: Muscle weakness (generalized)  Difficulty in walking, not elsewhere classified     Problem List Patient Active Problem List   Diagnosis Date Noted  . Acute respiratory failure with hypoxia ([place]) 12/21/2019  . Acute respiratory failure ([place]) 12/20/2019  . Severe Vitamin D deficiency 12/02/2019  . CAP (community acquired pneumonia) 12/01/2019  . Hypokalemia 12/01/2019  . Hyperlipidemia 12/01/2019  . BPH (benign prostatic hyperplasia) 12/01/2019  . Normocytic anemia 12/01/2019  . Pneumonia due to COVID-19 virus 12/01/2019  . Hypoxia   . SOB (shortness of breath) 11/16/2019  . Sepsis ([place]) 11/16/2019  . Hypertension   . Type 2 diabetes mellitus ([place])   . Depression   . Acute bronchitis with  bronchospasm 11/15/2019   Ihor Austin, LPTA/CLT; [facility] [phone]   Aldona Lento 01/12/2020, 5:45 PM  [facility] [address] [place], [place], [address] Phone: [phone]   Fax:  [phone]  Name: Shane Chambers MRN: [id number] Date of Birth: 1961-03-19

## 2020-01-13 NOTE — Addendum Note (Signed)
Addended by: Leeroy Cha on: 01/13/2020 04:20 PM   Modules accepted: Orders

## 2020-01-17 ENCOUNTER — Ambulatory Visit (HOSPITAL_COMMUNITY): Payer: Medicaid Other | Admitting: Physical Therapy

## 2020-01-17 ENCOUNTER — Telehealth (HOSPITAL_COMMUNITY): Payer: Self-pay | Admitting: Physical Therapy

## 2020-01-17 NOTE — Telephone Encounter (Signed)
Pt called stating he will not be here today - no reason given other than not feeling like it and he has already had Covid in Jan 2021.

## 2020-01-19 ENCOUNTER — Other Ambulatory Visit: Payer: Self-pay

## 2020-01-19 ENCOUNTER — Encounter (HOSPITAL_COMMUNITY): Payer: Self-pay

## 2020-01-19 ENCOUNTER — Ambulatory Visit (HOSPITAL_COMMUNITY): Payer: Medicaid Other

## 2020-01-19 VITALS — BP 142/67 | HR 77

## 2020-01-19 DIAGNOSIS — M6281 Muscle weakness (generalized): Secondary | ICD-10-CM | POA: Diagnosis not present

## 2020-01-19 DIAGNOSIS — R262 Difficulty in walking, not elsewhere classified: Secondary | ICD-10-CM

## 2020-01-19 NOTE — Therapy (Addendum)
[facility] [place], [place], [address] Phone: [phone]   Fax:  [phone]  Physical Therapy Treatment  Patient Details  Name: Shane Chambers MRN: [id number] Date of Birth: [birth year] Referring Provider (PT): Harriett Sine   Encounter Date: 01/19/2020  PT End of Session - 01/19/20 1636    Visit Number  4    Number of Visits  12    Date for PT Re-Evaluation  02/20/20    Authorization Type  medicaid:  approved authorization 3 visits 3/1-->01/22/20    Authorization - Visit Number  4    Authorization - Number of Visits  4    Progress Note Due on Visit  4    PT Start Time  [id number]    PT Stop Time  1648    PT Time Calculation (min)  39 min    Equipment Utilized During Treatment  Gait belt   RW   Activity Tolerance  Patient limited by pain;Patient limited by fatigue;Patient tolerated treatment well    Behavior During Therapy  [facility] for tasks assessed/performed       Past Medical History:  Diagnosis Date  . Depression   . Diabetes mellitus without complication ([place])   . Hyperlipidemia 12/01/2019  . Hypertension   . Type 2 diabetes mellitus ([place])     Past Surgical History:  Procedure Laterality Date  . DENTAL SURGERY      Vitals:   01/19/20 1602  BP: (!) 142/67  Pulse: 77    Subjective Assessment - 01/19/20 1602    Subjective  Pt reports several falls while walking at home due to dizziness everyday.  Currently c/o BLE pain scale 5/10.  Reports he has completed HEP 3-4 days a week.  Reports he has forgotten    Pertinent History  HTN, DM , DDD, HTN chronic bronchitis. past smoker.    Patient Stated Goals  To see if he can improve his ability to walk.    Currently in Pain?  Yes    Pain Score  5     Pain Location  Leg    Pain Orientation  Right;Left    Pain Descriptors / Indicators  Aching;Sore                       [facility] Adult PT Treatment/Exercise - 01/19/20 0001      Ambulation/Gait    Ambulation/Gait  Yes    Ambulation Distance (Feet)  18 Feet   75ft in 55.22"; rest due to fatigue then 23ft in 1'20"   Assistive device  Rolling walker    Gait Pattern  Step-through pattern;Decreased stride length;Trunk flexed    Ambulation Surface  Level;Indoor    Gait velocity  slow    Gait Comments  WC pushed behind      Exercises   Exercises  Knee/Hip      Knee/Hip Exercises: Standing   Heel Raises  Both;5 reps    Heel Raises Limitations  difficult    Forward Lunges  Both;5 reps    Forward Lunges Limitations  6in step with HHA    Functional Squat  5 reps;2 sets    Functional Squat Limitations  cueing for mechanics    SLS  unable    Gait Training  RW 69ft in 55.22" then long seated rest break.  RW80ft in 1'20" prior need to rest    Other Standing Knee Exercises  standing tolerance for 1'30" prior fatigue and need to sit;  worked on deep breathing      Knee/Hip Exercises: Seated   Other Seated Knee/Hip Exercises  diaphramic breathing through session               PT Short Term Goals - 01/19/20 1702      PT SHORT TERM GOAL #1   Title  PT to be able to walk with his walker for 5 minutes and 500' without having to sit down.    Baseline  01/19/20:  Able to ambulate 12 ft with RW in 1'20" prior fatigue and need to sit down.    Status  On-going      PT SHORT TERM GOAL #2   Title  Pt to be able to stand without UE assist for ten minutes in order to make a small meal ie sandwich.    Baseline  01/19/20: Pt able to stand 1'30" wiht UE A prior need to sit.    Status  On-going      PT SHORT TERM GOAL #3   Title  Pt to be able  to single leg stance for ten seconds to reduce the risk of falling    Baseline  01/19/20: unable        PT Long Term Goals - 01/03/20 0819      PT LONG TERM GOAL #1   Title  PT to be able to walk with walker for 15 minutes in order to be able to make quick shopping trips    Time  6    Period  Weeks    Status  New    [employer] Date  02/21/20   PT will  not start treatment until 3/1 due to needing authorization     PT LONG TERM GOAL #2   Title  PT to be able to stand for 20 minute to be able to socialize, make a small meal    Time  6    Period  Weeks    Status  New      PT LONG TERM GOAL #3   Title  PT to feel confident walking in his home with a cane    Time  6    Period  Weeks    Status  New      PT LONG TERM GOAL #4   Title  PT to be able to single leg stance for 20 seconds to reduce risk of falling    Time  6    Period  Weeks    Status  New      PT LONG TERM GOAL #5   Title  PT gluteal mm and core strength to be increased one grade to be able to come sit to stand without UE assist.    Time  6    Period  Weeks    Status  New            Plan - 01/19/20 1655    Clinical Impression Statement  Reviewed goals for additional visits with Medicaid authorization.  Pt forgot RW again this session, encouraged pt to buy wheel and bring into next session.  Reminded mother as well as assisted pt back to car.  Pt continues to fatigue quickly requiring seated rest breaks.  Pt able to ambuate 68ft in 1'20" prior need for rest breaks.  Reviewed HEP and encouraged pt to increased compliance for maximal benefits.  Pt able to stand with UE A for 1'30" prior need to sit.  Pt c/o 2 dizzy episodes during  session, vitals taken with BP at 142/67 and HR at 77bpm.  Pt unable to SLS.  Reviewed importance of safety prior STS to reduce risk of fall at home, pt able to demonstrate appropriate mechanics following initial cueing.  Pt c/o LE pain through session, no reports of increased pain at EOS, was limited by fatigue.    Personal Factors and Comorbidities  Fitness;Past/Current Experience;Time since onset of injury/illness/exacerbation    Examination-Activity Limitations  Carry;Lift;Dressing;Locomotion Level;Stairs    Examination-Participation Restrictions  Church;Cleaning;Community Activity;Laundry;Shop;Yard Work    [employer]chant [employer] officer   Evolving/Moderate complexity    Clinical Decision Making  Moderate    Rehab Potential  Fair    PT Frequency  2x / week    PT Duration  6 weeks    PT Treatment/Interventions  ADLs/Self Care Home Management;Gait training;DME Instruction;Stair training;Functional mobility training;Therapeutic activities;Therapeutic exercise;Balance training;Patient/family education;Neuromuscular re-education;Aquatic Therapy    PT Next Visit Plan  Complete dix hall-pike test.  Medicaid request needed for further visits.  If pt has obtained wheels on walker give pt tennis balls to put on the back legs, Continue to cue for diapharagmic breathing during functional tasks, walk around department, work on heel raises , marching, functional squats, lunges.  F/U with aquatic therapy.    PT Home Exercise Plan  eval:  diapharagmic breathing, ankle pump, LAQ< hip ab/adduction, sitting marching, standing as long as possible .        Patient will benefit from skilled therapeutic intervention in order to improve the following deficits and impairments:  Abnormal gait, Cardiopulmonary status limiting activity, Decreased activity tolerance, Decreased balance, Decreased coordination, Decreased safety awareness, Difficulty walking, Decreased strength, Impaired perceived functional ability  Visit Diagnosis: Muscle weakness (generalized)  Difficulty in walking, not elsewhere classified     Problem List Patient Active Problem List   Diagnosis Date Noted  . Acute respiratory failure with hypoxia ([place]) 12/21/2019  . Acute respiratory failure ([place]) 12/20/2019  . Severe Vitamin D deficiency 12/02/2019  . CAP (community acquired pneumonia) 12/01/2019  . Hypokalemia 12/01/2019  . Hyperlipidemia 12/01/2019  . BPH (benign prostatic hyperplasia) 12/01/2019  . Normocytic anemia 12/01/2019  . Pneumonia due to COVID-19 virus 12/01/2019  . Hypoxia   . SOB (shortness of breath) 11/16/2019  . Sepsis ([place]) 11/16/2019  . Hypertension   .  Type 2 diabetes mellitus ([place])   . Depression   . Acute bronchitis with bronchospasm 11/15/2019   Ihor Austin, LPTA/CLT; [place]  Rayetta Humphrey, [place] CLT [phone] 01/19/2020, 5:06 PM  [facility] [address] [place], [place], [address] Phone: [phone]   Fax:  [phone]  Name: Shane Chambers MRN: [id number] Date of Birth: 1961-04-23

## 2020-01-24 ENCOUNTER — Ambulatory Visit (HOSPITAL_COMMUNITY): Payer: Medicaid Other | Admitting: Physical Therapy

## 2020-01-24 ENCOUNTER — Telehealth (HOSPITAL_COMMUNITY): Payer: Self-pay | Admitting: Physical Therapy

## 2020-01-24 NOTE — Telephone Encounter (Signed)
Wife called to confirm they knew not to come today due to waiting on auth from insurance.

## 2020-01-24 NOTE — Telephone Encounter (Signed)
Called pt to cancel today's visit as his insurance has not approved his visits at this time.  No answer left message on phone.  Reminded pt that his next appointment is on Thursday.  Rayetta Humphrey, [place] CLT [phone]

## 2020-01-26 ENCOUNTER — Ambulatory Visit (HOSPITAL_COMMUNITY): Payer: Medicaid Other

## 2020-01-26 ENCOUNTER — Telehealth (HOSPITAL_COMMUNITY): Payer: Self-pay

## 2020-01-26 NOTE — Telephone Encounter (Signed)
No show, called and left message concerning missed apt.  Reminded next apt date and time as well as insurance has approved.  Left contact number for questions/concerns.    Ihor Austin, LPTA/CLT; Delana Meyer [phone]

## 2020-01-31 ENCOUNTER — Encounter (HOSPITAL_COMMUNITY): Payer: Self-pay

## 2020-01-31 ENCOUNTER — Ambulatory Visit (HOSPITAL_COMMUNITY): Payer: Medicaid Other

## 2020-01-31 ENCOUNTER — Other Ambulatory Visit: Payer: Self-pay

## 2020-01-31 DIAGNOSIS — M6281 Muscle weakness (generalized): Secondary | ICD-10-CM | POA: Diagnosis not present

## 2020-01-31 DIAGNOSIS — R262 Difficulty in walking, not elsewhere classified: Secondary | ICD-10-CM

## 2020-01-31 NOTE — Therapy (Signed)
[place], [place], [address] Phone: [phone]   Fax:  [phone]  Physical Therapy Treatment  Patient Details  Name: NYLE OLESH MRN: [id number] Date of Birth: 08/28/1961 Referring Provider (PT): Harriett Sine   Encounter Date: 01/31/2020  PT End of Session - 01/31/20 1446    Visit Number  5    Number of Visits  12    Date for PT Re-Evaluation  02/20/20    Authorization Type  medicaid:  approved authorization 3 visits 3/1-->01/22/20; 12 visits approved 3/17-->03/06/20    Authorization - Visit Number  1    Authorization - Number of Visits  12    Progress Note Due on Visit  10    PT Start Time  1400    PT Stop Time  1445    PT Time Calculation (min)  45 min    Equipment Utilized During Treatment  Gait belt   RW, WC behind during gait   Activity Tolerance  Patient limited by pain;Patient limited by fatigue;Patient tolerated treatment well    Behavior During Therapy  [facility] for tasks assessed/performed       Past Medical History:  Diagnosis Date  . Depression   . Diabetes mellitus without complication ([place])   . Hyperlipidemia 12/01/2019  . Hypertension   . Type 2 diabetes mellitus ([place])     Past Surgical History:  Procedure Laterality Date  . DENTAL SURGERY      There were no vitals filed for this visit.  Subjective Assessment - 01/31/20 1713    Subjective  Pt reports dizziness continues every day.  Pt with high pain scale BLE and buttock, scale 7/10.  Reports he has purchased wheels for walker and has helped a lot.  Pt reorts he contines to fall at home.    Pertinent History  HTN, DM , DDD, HTN chronic bronchitis. past smoker.    Patient Stated Goals  To see if he can improve his ability to walk.    Currently in Pain?  Yes    Pain Score  7     Pain Location  Leg   BLE and buttock   Pain Orientation  Right;Left    Pain Descriptors / Indicators  Aching;Sore    Pain Type  Chronic pain    Pain  Radiating Towards  BLE    Pain Onset  More than a month ago    Pain Frequency  Constant    Aggravating Factors   activity    Pain Relieving Factors  rest    Effect of Pain on Daily Activities  limits                       [facility] Adult PT Treatment/Exercise - 01/31/20 0001      Ambulation/Gait   Ambulation/Gait  Yes    Ambulation Distance (Feet)  26 Feet    Assistive device  Rolling walker    Gait Pattern  Step-through pattern;Decreased stride length;Trunk flexed    Ambulation Surface  Level;Indoor    Gait velocity  slow    Gait Comments  2MWT, WC pushed behind      Exercises   Exercises  Knee/Hip      Knee/Hip Exercises: Standing   Heel Raises  Both;5 reps    Heel Raises Limitations  difficult    Forward Lunges  Both;5 reps    Forward Lunges Limitations  6in step with HHA    Functional Squat  5 reps;2 sets    Functional Squat Limitations  cueing for mechanics    Gait Training  2MWT 26 ft with WB behind    Other Standing Knee Exercises  standing tolerance for 1'30" prior fatigue and need to sit; worked on deep breathing      Knee/Hip Exercises: Seated   Other Seated Knee/Hip Exercises  dix- hal pike Rt and Lt no nystagmus or change in dizziness either direction             PT Education - 01/31/20 1716    Education Details  Pt instructed placing tennis balls on walker.  Encouraged increased compliance with HEP regulary    Methods  Explanation;Verbal cues    Comprehension  Verbalized understanding;Need further instruction       PT Short Term Goals - 01/19/20 1702      PT SHORT TERM GOAL #1   Title  PT to be able to walk with his walker for 5 minutes and 500' without having to sit down.    Baseline  01/19/20:  Able to ambulate 12 ft with RW in 1'20" prior fatigue and need to sit down.    Status  On-going      PT SHORT TERM GOAL #2   Title  Pt to be able to stand without UE assist for ten minutes in order to make a small meal ie sandwich.     Baseline  01/19/20: Pt able to stand 1'30" wiht UE A prior need to sit.    Status  On-going      PT SHORT TERM GOAL #3   Title  Pt to be able  to single leg stance for ten seconds to reduce the risk of falling    Baseline  01/19/20: unable        PT Long Term Goals - 01/03/20 0819      PT LONG TERM GOAL #1   Title  PT to be able to walk with walker for 15 minutes in order to be able to make quick shopping trips    Time  6    Period  Weeks    Status  New    [employer] Date  02/21/20   PT will not start treatment until 3/1 due to needing authorization     PT LONG TERM GOAL #2   Title  PT to be able to stand for 20 minute to be able to socialize, make a small meal    Time  6    Period  Weeks    Status  New      PT LONG TERM GOAL #3   Title  PT to feel confident walking in his home with a cane    Time  6    Period  Weeks    Status  New      PT LONG TERM GOAL #4   Title  PT to be able to single leg stance for 20 seconds to reduce risk of falling    Time  6    Period  Weeks    Status  New      PT LONG TERM GOAL #5   Title  PT gluteal mm and core strength to be increased one grade to be able to come sit to stand without UE assist.    Time  6    Period  Weeks    Status  New            Plan - 01/31/20 1717  Clinical Impression Statement  Pt continues to c/o dizziness daily, began session with dix hall-pike test.  No nystagmus noted either direction or reports of change.  Pt reoprts he has purchased wheels for walker and reoprts increased ease ambulating.  Pt given tennis balls and instructed placement onto walker wiht verbalized understanding.  2MWT complete this session, able to ambulate 26 feet prior fatigue and around the same amount of time for standing tolerance.  Pt educated on importance of HEP compliance for maximal benefits as does not seem to be progressing.  Pt limited by BLE pain, dizziness and fatigue at EOS.    Personal Factors and Comorbidities   Fitness;Past/Current Experience;Time since onset of injury/illness/exacerbation    Examination-Activity Limitations  Carry;Lift;Dressing;Locomotion Level;Stairs    Examination-Participation Restrictions  Church;Cleaning;Community Activity;Laundry;Shop;Yard Work    [employer]  Evolving/Moderate complexity    Clinical Decision Making  Moderate    Rehab Potential  Fair    PT Frequency  2x / week    PT Duration  6 weeks    PT Treatment/Interventions  ADLs/Self Care Home Management;Gait training;DME Instruction;Stair training;Functional mobility training;Therapeutic activities;Therapeutic exercise;Balance training;Patient/family education;Neuromuscular re-education;Aquatic Therapy    PT Next Visit Plan  Review current HEP compliance.  Continue to cue for diapharagmic breathing during functional tasks, walk around department, work on heel raises , marching, functional squats, lunges.  F/U with aquatic therapy.    PT Home Exercise Plan  eval:  diapharagmic breathing, ankle pump, LAQ< hip ab/adduction, sitting marching, standing as long as possible       Patient will benefit from skilled therapeutic intervention in order to improve the following deficits and impairments:  Abnormal gait, Cardiopulmonary status limiting activity, Decreased activity tolerance, Decreased balance, Decreased coordination, Decreased safety awareness, Difficulty walking, Decreased strength, Impaired perceived functional ability  Visit Diagnosis: Difficulty in walking, not elsewhere classified  Muscle weakness (generalized)     Problem List Patient Active Problem List   Diagnosis Date Noted  . Acute respiratory failure with hypoxia ([place]) 12/21/2019  . Acute respiratory failure ([place]) 12/20/2019  . Severe Vitamin D deficiency 12/02/2019  . CAP (community acquired pneumonia) 12/01/2019  . Hypokalemia 12/01/2019  . Hyperlipidemia 12/01/2019  . BPH (benign prostatic hyperplasia) 12/01/2019  .  Normocytic anemia 12/01/2019  . Pneumonia due to COVID-19 virus 12/01/2019  . Hypoxia   . SOB (shortness of breath) 11/16/2019  . Sepsis ([place]) 11/16/2019  . Hypertension   . Type 2 diabetes mellitus ([place])   . Depression   . Acute bronchitis with bronchospasm 11/15/2019   Ihor Austin, LPTA/CLT; CBIS [phone]  Aldona Lento 01/31/2020, 5:24 PM  [facility] [address] [place], [place], [address] Phone: [phone]   Fax:  [phone]  Name: BYNUM REIGEL MRN: [id number] Date of Birth: 09/08/1961

## 2020-02-02 ENCOUNTER — Ambulatory Visit (HOSPITAL_COMMUNITY): Payer: Medicaid Other

## 2020-02-02 ENCOUNTER — Encounter (HOSPITAL_COMMUNITY): Payer: Self-pay

## 2020-02-02 ENCOUNTER — Other Ambulatory Visit: Payer: Self-pay

## 2020-02-02 DIAGNOSIS — M6281 Muscle weakness (generalized): Secondary | ICD-10-CM

## 2020-02-02 DIAGNOSIS — R262 Difficulty in walking, not elsewhere classified: Secondary | ICD-10-CM

## 2020-02-02 NOTE — Therapy (Signed)
[place], [place], [address] Phone: [phone]   Fax:  [phone]  Physical Therapy Treatment  Patient Details  Name: Shane Chambers MRN: [id number] Date of Birth: 1961-01-18 Referring Provider (PT): Harriett Sine   Encounter Date: 02/02/2020  PT End of Session - 02/02/20 1457    Visit Number  6    Number of Visits  12    Date for PT Re-Evaluation  02/20/20    Authorization Type  medicaid:  approved authorization 3 visits 3/1-->01/22/20; 12 visits approved 3/17-->03/06/20    Authorization - Visit Number  2    Authorization - Number of Visits  12    Progress Note Due on Visit  10    PT Start Time  [id number]    PT Stop Time  1528    PT Time Calculation (min)  45 min    Equipment Utilized During Treatment  Gait belt   RW with WC behind   Activity Tolerance  Patient limited by pain;Patient limited by fatigue;Patient tolerated treatment well    Behavior During Therapy  [facility] for tasks assessed/performed       Past Medical History:  Diagnosis Date  . Depression   . Diabetes mellitus without complication ([place])   . Hyperlipidemia 12/01/2019  . Hypertension   . Type 2 diabetes mellitus ([place])     Past Surgical History:  Procedure Laterality Date  . DENTAL SURGERY      There were no vitals filed for this visit.  Subjective Assessment - 02/02/20 1451    Subjective  Pt arrived with SOB.  Reports BLE and buttock pain scale 6/10.  Reports he stayed in bed all day yesterday, didnt get around to the exercises.    Pertinent History  HTN, DM , DDD, HTN chronic bronchitis. past smoker.    Patient Stated Goals  To see if he can improve his ability to walk.    Currently in Pain?  Yes    Pain Score  6     Pain Location  Leg    Pain Orientation  Right;Left    Pain Descriptors / Indicators  Aching;Sore    Pain Type  Chronic pain    Pain Onset  More than a month ago    Pain Frequency  Constant                        [facility] Adult PT Treatment/Exercise - 02/02/20 0001      Ambulation/Gait   Ambulation/Gait  Yes    Ambulation Distance (Feet)  80 Feet   38 then 42 ft   Assistive device  Rolling walker    Gait Pattern  Step-through pattern;Decreased stride length;Trunk flexed    Ambulation Surface  Level;Indoor    Gait velocity  slow    Gait Comments  WC pushed behind      Exercises   Exercises  Knee/Hip      Knee/Hip Exercises: Standing   Forward Lunges  Both;5 reps    Forward Lunges Limitations  6in step with HHA    Functional Squat  10 reps    Functional Squat Limitations  cueing for mechanics    Gait Training  RW 57ft rest break then 58ft     Other Standing Knee Exercises  alternating marching 5reps x 2 sets    Other Standing Knee Exercises  standing       Knee/Hip Exercises: Seated   Other Seated Knee/Hip Exercises  diaphramic breathing through session    Other Seated Knee/Hip Exercises  ankle pumps while seated rest break    Sit to Sand  2 sets;5 reps;with UE support   from [facility]              PT Short Term Goals - 01/19/20 1702      PT SHORT TERM GOAL #1   Title  PT to be able to walk with his walker for 5 minutes and 500' without having to sit down.    Baseline  01/19/20:  Able to ambulate 12 ft with RW in 1'20" prior fatigue and need to sit down.    Status  On-going      PT SHORT TERM GOAL #2   Title  Pt to be able to stand without UE assist for ten minutes in order to make a small meal ie sandwich.    Baseline  01/19/20: Pt able to stand 1'30" wiht UE A prior need to sit.    Status  On-going      PT SHORT TERM GOAL #3   Title  Pt to be able  to single leg stance for ten seconds to reduce the risk of falling    Baseline  01/19/20: unable        PT Long Term Goals - 01/03/20 0819      PT LONG TERM GOAL #1   Title  PT to be able to walk with walker for 15 minutes in order to be able to make quick shopping trips    Time  6    Period   Weeks    Status  New    [employer] Date  02/21/20   PT will not start treatment until 3/1 due to needing authorization     PT LONG TERM GOAL #2   Title  PT to be able to stand for 20 minute to be able to socialize, make a small meal    Time  6    Period  Weeks    Status  New      PT LONG TERM GOAL #3   Title  PT to feel confident walking in his home with a cane    Time  6    Period  Weeks    Status  New      PT LONG TERM GOAL #4   Title  PT to be able to single leg stance for 20 seconds to reduce risk of falling    Time  6    Period  Weeks    Status  New      PT LONG TERM GOAL #5   Title  PT gluteal mm and core strength to be increased one grade to be able to come sit to stand without UE assist.    Time  6    Period  Weeks    Status  New            Plan - 02/02/20 1548    Clinical Impression Statement  Began session with diaphragmatic breathing to reduce SOB.  Added tennis balls to pt.'s RW. Improved activity tolerance noted with improved distance prior need for seated rest break.  Pt educated on importance of compliance with HEP and encouraged to get out of the bed during day light hours.  EOS pt limited by LE pain, fatigue and reports of dizziness unchanged through session.    Personal Factors and Comorbidities  Fitness;Past/Current Experience;Time since onset of injury/illness/exacerbation    Examination-Activity  Limitations  Carry;Lift;Dressing;Locomotion Level;Stairs    Examination-Participation Restrictions  Church;Cleaning;Community Activity;Laundry;Shop;Yard Work    [employer] officer  Evolving/Moderate complexity    Clinical Decision Making  Moderate    Rehab Potential  Fair    PT Frequency  2x / week    PT Duration  6 weeks    PT Treatment/Interventions  ADLs/Self Care Home Management;Gait training;DME Instruction;Stair training;Functional mobility training;Therapeutic activities;Therapeutic exercise;Balance training;Patient/family  education;Neuromuscular re-education;Aquatic Therapy    PT Next Visit Plan  Review current HEP compliance.  Continue to cue for diapharagmic breathing during functional tasks, walk around department, work on heel raises , marching, functional squats, lunges.  F/U with aquatic therapy.    PT Home Exercise Plan  eval:  diapharagmic breathing, ankle pump, LAQ< hip ab/adduction, sitting marching, standing as long as possible       Patient will benefit from skilled therapeutic intervention in order to improve the following deficits and impairments:  Abnormal gait, Cardiopulmonary status limiting activity, Decreased activity tolerance, Decreased balance, Decreased coordination, Decreased safety awareness, Difficulty walking, Decreased strength, Impaired perceived functional ability  Visit Diagnosis: Muscle weakness (generalized)  Difficulty in walking, not elsewhere classified     Problem List Patient Active Problem List   Diagnosis Date Noted  . Acute respiratory failure with hypoxia ([place]) 12/21/2019  . Acute respiratory failure ([place]) 12/20/2019  . Severe Vitamin D deficiency 12/02/2019  . CAP (community acquired pneumonia) 12/01/2019  . Hypokalemia 12/01/2019  . Hyperlipidemia 12/01/2019  . BPH (benign prostatic hyperplasia) 12/01/2019  . Normocytic anemia 12/01/2019  . Pneumonia due to COVID-19 virus 12/01/2019  . Hypoxia   . SOB (shortness of breath) 11/16/2019  . Sepsis ([place]) 11/16/2019  . Hypertension   . Type 2 diabetes mellitus ([place])   . Depression   . Acute bronchitis with bronchospasm 11/15/2019   Ihor Austin, LPTA/CLT; CBIS [phone]  Aldona Lento 02/02/2020, 3:58 PM  [facility] [address] [place], [place], [address] Phone: [phone]   Fax:  [phone]  Name: ALPHONSO MAYORAL MRN: [id number] Date of Birth: 11-21-1960

## 2020-02-07 ENCOUNTER — Telehealth (HOSPITAL_COMMUNITY): Payer: Self-pay | Admitting: Physical Therapy

## 2020-02-07 ENCOUNTER — Ambulatory Visit (HOSPITAL_COMMUNITY): Payer: Medicaid Other | Admitting: Physical Therapy

## 2020-02-07 NOTE — Telephone Encounter (Signed)
Attempted to call pt re missed appointment. Pt number is not a working number at this time.  Rayetta Humphrey, [place] CLT [phone]

## 2020-02-09 ENCOUNTER — Telehealth (HOSPITAL_COMMUNITY): Payer: Self-pay | Admitting: Physical Therapy

## 2020-02-09 ENCOUNTER — Ambulatory Visit (HOSPITAL_COMMUNITY): Payer: Medicaid Other | Admitting: Physical Therapy

## 2020-02-09 NOTE — Telephone Encounter (Signed)
pt cancelled appt for today, no reason given 

## 2020-02-24 ENCOUNTER — Other Ambulatory Visit: Payer: Self-pay

## 2020-02-24 ENCOUNTER — Encounter (HOSPITAL_COMMUNITY): Payer: Self-pay

## 2020-02-24 ENCOUNTER — Emergency Department (HOSPITAL_COMMUNITY)
Admission: EM | Admit: 2020-02-24 | Discharge: 2020-02-24 | Disposition: A | Discharge status: Home / Self Care | Payer: Medicaid Other | Attending: Emergency Medicine | Admitting: Emergency Medicine

## 2020-02-24 DIAGNOSIS — Z7982 Long term (current) use of aspirin: Secondary | ICD-10-CM | POA: Diagnosis not present

## 2020-02-24 DIAGNOSIS — I10 Essential (primary) hypertension: Secondary | ICD-10-CM | POA: Diagnosis not present

## 2020-02-24 DIAGNOSIS — H5712 Ocular pain, left eye: Secondary | ICD-10-CM | POA: Diagnosis present

## 2020-02-24 DIAGNOSIS — H1032 Unspecified acute conjunctivitis, left eye: Secondary | ICD-10-CM | POA: Diagnosis not present

## 2020-02-24 DIAGNOSIS — Z79899 Other long term (current) drug therapy: Secondary | ICD-10-CM | POA: Diagnosis not present

## 2020-02-24 DIAGNOSIS — E1165 Type 2 diabetes mellitus with hyperglycemia: Secondary | ICD-10-CM | POA: Diagnosis not present

## 2020-02-24 DIAGNOSIS — R739 Hyperglycemia, unspecified: Secondary | ICD-10-CM

## 2020-02-24 DIAGNOSIS — Z794 Long term (current) use of insulin: Secondary | ICD-10-CM | POA: Insufficient documentation

## 2020-02-24 DIAGNOSIS — Z87891 Personal history of nicotine dependence: Secondary | ICD-10-CM | POA: Diagnosis not present

## 2020-02-24 LAB — CBG MONITORING, ED: Glucose-Capillary: 447 mg/dL — ABNORMAL HIGH (ref 70–99)

## 2020-02-24 MED ORDER — TOBRAMYCIN 0.3 % OP SOLN
1.0000 [drp] | OPHTHALMIC | Status: DC
  Administered 2020-02-24: 1 [drp] via OPHTHALMIC
  Filled 2020-02-24: qty 5

## 2020-02-24 MED ORDER — TETRACAINE HCL 0.5 % OP SOLN
2.0000 [drp] | Freq: Once | OPHTHALMIC | Status: AC
  Administered 2020-02-24: 2 [drp] via OPHTHALMIC
  Filled 2020-02-24: qty 4

## 2020-02-24 MED ORDER — FLUORESCEIN SODIUM 1 MG OP STRP
1.0000 | ORAL_STRIP | Freq: Once | OPHTHALMIC | Status: AC
  Administered 2020-02-24: 02:00:00 1 via OPHTHALMIC
  Filled 2020-02-24: qty 1

## 2020-02-24 MED ORDER — ACETAMINOPHEN 500 MG PO TABS
1000.0000 mg | ORAL_TABLET | Freq: Once | ORAL | Status: AC
  Administered 2020-02-24: 1000 mg via ORAL
  Filled 2020-02-24: qty 2

## 2020-02-24 NOTE — ED Triage Notes (Signed)
Pt reports headache onset tonight, also c/o left eye redness and irritation.  Pt states he wears glasses but they are broken, so his vision is not good right now.  Pt has redness, irritation and drainage from left eye.

## 2020-02-24 NOTE — ED Provider Notes (Signed)
[facility] EMERGENCY DEPARTMENT Provider Note   CSN: [id number] Arrival date & time: 02/24/20  0047   Time seen 1:08 AM  History Chief Complaint  Patient presents with  . Headache  . Eye Pain    Shane Chambers is a 59 y.o. male.  HPI   Patient states he has been having problems of his left thigh off and on for several days.  He states it is itching, burning, and draining.  He states his eyelids are stuck together in the morning.  He states he took a nap this evening after dinner and when he woke up it seemed to get worse.  He now has a frontal headache bilaterally that is aching and throbbing.  He denies nausea or vomiting.  He states his vision is blurred and cloudy.  He states he is never had this before.  He denies being around children.  He states he was seen by an optometrist a few weeks ago and was told he had some small cataracts in his eyes.  He was seen by his PCP on the 13th and did not mention it.  Patient states his blood sugars are up and down which is typical for him.  PCP [name], MD   Past Medical History:  Diagnosis Date  . Depression   . Diabetes mellitus without complication ([place])   . Hyperlipidemia 12/01/2019  . Hypertension   . Type 2 diabetes mellitus [facility])     Patient Active Problem List   Diagnosis Date Noted  . Acute respiratory failure with hypoxia ([place]) 12/21/2019  . Acute respiratory failure ([place]) 12/20/2019  . Severe Vitamin D deficiency 12/02/2019  . CAP (community acquired pneumonia) 12/01/2019  . Hypokalemia 12/01/2019  . Hyperlipidemia 12/01/2019  . BPH (benign prostatic hyperplasia) 12/01/2019  . Normocytic anemia 12/01/2019  . Pneumonia due to COVID-19 virus 12/01/2019  . Hypoxia   . SOB (shortness of breath) 11/16/2019  . Sepsis ([place]) 11/16/2019  . Hypertension   . Type 2 diabetes mellitus ([place])   . Depression   . Acute bronchitis with bronchospasm 11/15/2019    Past Surgical History:  Procedure Laterality Date  .  DENTAL SURGERY         Family History  Problem Relation Age of Onset  . Hypertension Mother     Social History   Tobacco Use  . Smoking status: Former Research scientist (life sciences)  . Smokeless tobacco: Never Used  Substance Use Topics  . Alcohol use: Yes    Comment: rarely  . Drug use: No    Home Medications Prior to Admission medications   Medication Sig Start Date End Date Taking? Authorizing Provider  albuterol (VENTOLIN HFA) 108 (90 Base) MCG/ACT inhaler Inhale 1-2 puffs into the lungs every 6 (six) hours as needed for wheezing or shortness of breath.  07/27/19   [provider]  amLODipine (NORVASC) 10 MG tablet Take 10 mg by mouth daily.    [provider]  aspirin 81 MG EC tablet Take 81 mg by mouth daily.  03/10/16   [provider]  atorvastatin (LIPITOR) 40 MG tablet Take 40 mg by mouth daily. 10/20/19   [provider]  docusate sodium (COLACE) 100 MG capsule Take 1 capsule (100 mg total) by mouth 2 (two) times daily. 12/25/19   Manuella Ghazi, Pratik D, DO  fluticasone (VERAMYST) 27.5 MCG/SPRAY nasal spray Place 2 sprays into the nose daily as needed for rhinitis or allergies.     [provider]  furosemide (LASIX)  20 MG tablet Take 1 tablet (20 mg total) by mouth daily. Take an extra 20mg  by mouth if noted to have greater than 3 lb weight gain in 24 hours. 12/25/19 12/24/20  Manuella Ghazi, Pratik D, DO  gabapentin (NEURONTIN) 300 MG capsule Take 900 mg by mouth 2 (two) times daily.     [provider]  indomethacin (INDOCIN) 25 MG capsule Take 25 mg by mouth 2 (two) times daily as needed for mild pain or moderate pain.  11/24/19   [provider]  insulin aspart protamine- aspart (NOVOLOG MIX 70/30) (70-30) 100 UNIT/ML injection Inject 0.55 mLs (55 Units total) into the skin 2 (two) times daily with a meal. 12/25/19   Manuella Ghazi, Pratik D, DO  Iron, Ferrous Sulfate, 325 (65 Fe) MG TABS Take 1 tablet by mouth daily. 12/06/19   Johnson, Clanford L, MD  metFORMIN  (GLUCOPHAGE) 1000 MG tablet Take 1,000 mg by mouth 2 (two) times daily with a meal.    [provider]  metoprolol (TOPROL-XL) 200 MG 24 hr tablet Take 200 mg by mouth daily.    [provider]  Multiple Vitamins-Minerals (TAB-A-VITE) TABS Take 1 tablet by mouth daily. 07/27/19   [provider]  omeprazole (PRILOSEC) 40 MG capsule Take 40 mg by mouth daily.    [provider]  polyethylene glycol (MIRALAX / GLYCOLAX) 17 g packet Take 17 g by mouth daily as needed for mild constipation. 12/25/19   Manuella Ghazi, Pratik D, DO  potassium chloride (KLOR-CON) 10 MEQ tablet Take 1 tablet (10 mEq total) by mouth daily. 12/25/19 01/24/20  Manuella Ghazi, Pratik D, DO  tamsulosin (FLOMAX) 0.4 MG CAPS capsule Take 0.4 mg by mouth daily.    [provider]  venlafaxine XR (EFFEXOR-XR) 150 MG 24 hr capsule Take 150 mg by mouth daily with breakfast.    [provider]    Allergies    Ace inhibitors, Other, and Penicillins  Review of Systems   Review of Systems  All other systems reviewed and are negative.   Physical Exam Updated Vital Signs BP (!) 143/82 (BP Location: Left Arm)   Pulse 78   Temp 98.7 F (37.1 C) (Oral)   Resp 19   Ht 5\' 9"  (1.753 m)   Wt 131.5 kg   SpO2 97%   BMI 42.83 kg/m   Physical Exam Vitals and nursing note reviewed.  Constitutional:      Appearance: Normal appearance. He is obese.  HENT:     Head: Normocephalic and atraumatic.     Right Ear: External ear normal.     Left Ear: External ear normal.  Eyes:     Extraocular Movements: Extraocular movements intact.     Pupils: Pupils are equal, round, and reactive to light.     Comments: Patient has some mild swelling of his upper and lower eyelid.  He is able to open his eye.  When I look at his left eye there is a lot of purulent drainage seen.  There is no redness or drainage of the right eyelids.  His pupil is reactive to light.  He does not have pain on range of motion of his  eyeball.  There is mild injection of the conjunctiva of the left eyeball.  Cardiovascular:     Rate and Rhythm: Normal rate.  Pulmonary:     Effort: Pulmonary effort is normal. No respiratory distress.  Skin:    General: Skin is warm and dry.  Neurological:     General:  No focal deficit present.     Mental Status: He is alert and oriented to person, place, and time.     Cranial Nerves: No cranial nerve deficit.  Psychiatric:        Mood and Affect: Mood normal.        Behavior: Behavior normal.        Thought Content: Thought content normal.      Visual Acuity  Right Eye Distance: 20/200 Left Eye Distance: 20/200 Bilateral Distance: 20/200  Right Eye Near:   Left Eye Near:    Bilateral Near:     Patient notes his glasses are broken and he does not have new ones yet.  ED Results / Procedures / Treatments   Labs (all labs ordered are listed, but only abnormal results are displayed) Results for orders placed or performed during the hospital encounter of 02/24/20  CBG monitoring, ED  Result Value Ref Range   Glucose-Capillary 447 (H) 70 - 99 mg/dL   Laboratory interpretation all normal except hyperglycemia    EKG None  Radiology No results found.  Procedures Procedures (including critical care time)  Medications Ordered in ED Medications  tobramycin (TOBREX) 0.3 % ophthalmic solution 1 drop (1 drop Left Eye Given 02/24/20 0133)  acetaminophen (TYLENOL) tablet 1,000 mg (1,000 mg Oral Given 02/24/20 0133)  fluorescein ophthalmic strip 1 strip (1 strip Left Eye Given 02/24/20 0135)  tetracaine (PONTOCAINE) 0.5 % ophthalmic solution 2 drop (2 drops Left Eye Given by Other 02/24/20 0135)    ED Course  I have reviewed the triage vital signs and the nursing notes.  Pertinent labs & imaging results that were available during my care of the patient were reviewed by me and considered in my medical decision making (see chart for details).    MDM Rules/Calculators/A&P                      Patient's exam is consistent with conjunctivitis.  He does not have a small pupil or intensely injected conjunctiva to suggest glaucoma with the headache.  He has a lot of drainage, he denies any dysuria or penile drip.  Fluorescein stain and tetracaine was placed in his left eye.  It is negative for uptake.  Patient CBG is 447, he states that is pretty typical lately.  He has not taken his insulin or any of his evening medications yet.  He states he is go to take it as soon as he gets home.  He does not have any nausea, vomiting, or shortness of breath to suggest DKA.  He was started on tobramycin eyedrops, he understands how to take them.  Final Clinical Impression(s) / ED Diagnoses Final diagnoses:  Acute conjunctivitis of left eye, unspecified acute conjunctivitis type  Hyperglycemia    Rx / DC Orders ED Discharge Orders    None     Plan discharge  Rolland Porter, MD, Barbette Or, MD 02/24/20 [id number]

## 2020-02-24 NOTE — Discharge Instructions (Signed)
Wash your hands frequently, try not to touch your right eye after you touch your left eye without washing your hands first.  Use the antibiotic eyedrops in your left eye every 4 hours while awake the first couple days and then 4 times a day for a total of about a week.  You should notice a rapid improvement in the infection.  If you should get a fever or eye pain or you get a lot of swelling or redness in your eyelids, you should be rechecked.  Please take your insulin and your evening medicine when you get home.

## 2020-03-05 ENCOUNTER — Emergency Department (HOSPITAL_COMMUNITY): Payer: Medicaid Other

## 2020-03-05 ENCOUNTER — Other Ambulatory Visit: Payer: Self-pay

## 2020-03-05 ENCOUNTER — Encounter (HOSPITAL_COMMUNITY): Payer: Self-pay

## 2020-03-05 ENCOUNTER — Inpatient Hospital Stay (HOSPITAL_COMMUNITY)
Admission: EM | Admit: 2020-03-05 | Discharge: 2020-03-19 | DRG: 233 | Disposition: A | Discharge status: Skilled Nursing Facility | Payer: Medicaid Other | Attending: Cardiothoracic Surgery | Admitting: Cardiothoracic Surgery

## 2020-03-05 DIAGNOSIS — E559 Vitamin D deficiency, unspecified: Secondary | ICD-10-CM | POA: Diagnosis present

## 2020-03-05 DIAGNOSIS — E1159 Type 2 diabetes mellitus with other circulatory complications: Secondary | ICD-10-CM | POA: Diagnosis not present

## 2020-03-05 DIAGNOSIS — Z09 Encounter for follow-up examination after completed treatment for conditions other than malignant neoplasm: Secondary | ICD-10-CM

## 2020-03-05 DIAGNOSIS — Z8616 Personal history of COVID-19: Secondary | ICD-10-CM | POA: Diagnosis not present

## 2020-03-05 DIAGNOSIS — J9811 Atelectasis: Secondary | ICD-10-CM | POA: Diagnosis present

## 2020-03-05 DIAGNOSIS — Z818 Family history of other mental and behavioral disorders: Secondary | ICD-10-CM | POA: Diagnosis not present

## 2020-03-05 DIAGNOSIS — N400 Enlarged prostate without lower urinary tract symptoms: Secondary | ICD-10-CM | POA: Diagnosis present

## 2020-03-05 DIAGNOSIS — E876 Hypokalemia: Secondary | ICD-10-CM | POA: Diagnosis not present

## 2020-03-05 DIAGNOSIS — Z951 Presence of aortocoronary bypass graft: Secondary | ICD-10-CM

## 2020-03-05 DIAGNOSIS — I358 Other nonrheumatic aortic valve disorders: Secondary | ICD-10-CM | POA: Diagnosis present

## 2020-03-05 DIAGNOSIS — J9601 Acute respiratory failure with hypoxia: Secondary | ICD-10-CM | POA: Diagnosis present

## 2020-03-05 DIAGNOSIS — R443 Hallucinations, unspecified: Secondary | ICD-10-CM | POA: Diagnosis present

## 2020-03-05 DIAGNOSIS — I5043 Acute on chronic combined systolic (congestive) and diastolic (congestive) heart failure: Secondary | ICD-10-CM | POA: Diagnosis not present

## 2020-03-05 DIAGNOSIS — I10 Essential (primary) hypertension: Secondary | ICD-10-CM | POA: Diagnosis not present

## 2020-03-05 DIAGNOSIS — F329 Major depressive disorder, single episode, unspecified: Secondary | ICD-10-CM | POA: Diagnosis present

## 2020-03-05 DIAGNOSIS — Z880 Allergy status to penicillin: Secondary | ICD-10-CM

## 2020-03-05 DIAGNOSIS — Z01810 Encounter for preprocedural cardiovascular examination: Secondary | ICD-10-CM | POA: Diagnosis not present

## 2020-03-05 DIAGNOSIS — R400 Somnolence: Secondary | ICD-10-CM | POA: Diagnosis not present

## 2020-03-05 DIAGNOSIS — I25119 Atherosclerotic heart disease of native coronary artery with unspecified angina pectoris: Secondary | ICD-10-CM | POA: Diagnosis present

## 2020-03-05 DIAGNOSIS — E1165 Type 2 diabetes mellitus with hyperglycemia: Secondary | ICD-10-CM | POA: Diagnosis present

## 2020-03-05 DIAGNOSIS — I214 Non-ST elevation (NSTEMI) myocardial infarction: Secondary | ICD-10-CM | POA: Diagnosis present

## 2020-03-05 DIAGNOSIS — J90 Pleural effusion, not elsewhere classified: Secondary | ICD-10-CM | POA: Diagnosis present

## 2020-03-05 DIAGNOSIS — Z91048 Other nonmedicinal substance allergy status: Secondary | ICD-10-CM

## 2020-03-05 DIAGNOSIS — Z79899 Other long term (current) drug therapy: Secondary | ICD-10-CM

## 2020-03-05 DIAGNOSIS — E785 Hyperlipidemia, unspecified: Secondary | ICD-10-CM | POA: Diagnosis present

## 2020-03-05 DIAGNOSIS — E1142 Type 2 diabetes mellitus with diabetic polyneuropathy: Secondary | ICD-10-CM | POA: Diagnosis present

## 2020-03-05 DIAGNOSIS — Z87891 Personal history of nicotine dependence: Secondary | ICD-10-CM | POA: Diagnosis not present

## 2020-03-05 DIAGNOSIS — Z7982 Long term (current) use of aspirin: Secondary | ICD-10-CM

## 2020-03-05 DIAGNOSIS — I25110 Atherosclerotic heart disease of native coronary artery with unstable angina pectoris: Secondary | ICD-10-CM | POA: Diagnosis not present

## 2020-03-05 DIAGNOSIS — E119 Type 2 diabetes mellitus without complications: Secondary | ICD-10-CM

## 2020-03-05 DIAGNOSIS — I25118 Atherosclerotic heart disease of native coronary artery with other forms of angina pectoris: Secondary | ICD-10-CM | POA: Diagnosis not present

## 2020-03-05 DIAGNOSIS — E782 Mixed hyperlipidemia: Secondary | ICD-10-CM | POA: Diagnosis not present

## 2020-03-05 DIAGNOSIS — Z20822 Contact with and (suspected) exposure to covid-19: Secondary | ICD-10-CM | POA: Diagnosis present

## 2020-03-05 DIAGNOSIS — Z9889 Other specified postprocedural states: Secondary | ICD-10-CM

## 2020-03-05 DIAGNOSIS — Z6841 Body Mass Index (BMI) 40.0 and over, adult: Secondary | ICD-10-CM

## 2020-03-05 DIAGNOSIS — I509 Heart failure, unspecified: Secondary | ICD-10-CM

## 2020-03-05 DIAGNOSIS — I2510 Atherosclerotic heart disease of native coronary artery without angina pectoris: Secondary | ICD-10-CM

## 2020-03-05 DIAGNOSIS — I255 Ischemic cardiomyopathy: Secondary | ICD-10-CM | POA: Diagnosis not present

## 2020-03-05 DIAGNOSIS — I110 Hypertensive heart disease with heart failure: Secondary | ICD-10-CM | POA: Diagnosis present

## 2020-03-05 DIAGNOSIS — I340 Nonrheumatic mitral (valve) insufficiency: Secondary | ICD-10-CM | POA: Diagnosis not present

## 2020-03-05 DIAGNOSIS — R0602 Shortness of breath: Secondary | ICD-10-CM | POA: Diagnosis present

## 2020-03-05 DIAGNOSIS — Z8249 Family history of ischemic heart disease and other diseases of the circulatory system: Secondary | ICD-10-CM | POA: Diagnosis not present

## 2020-03-05 DIAGNOSIS — Z4682 Encounter for fitting and adjustment of non-vascular catheter: Secondary | ICD-10-CM

## 2020-03-05 DIAGNOSIS — E871 Hypo-osmolality and hyponatremia: Secondary | ICD-10-CM | POA: Diagnosis present

## 2020-03-05 DIAGNOSIS — Z8701 Personal history of pneumonia (recurrent): Secondary | ICD-10-CM

## 2020-03-05 DIAGNOSIS — I5033 Acute on chronic diastolic (congestive) heart failure: Secondary | ICD-10-CM | POA: Diagnosis present

## 2020-03-05 DIAGNOSIS — Z794 Long term (current) use of insulin: Secondary | ICD-10-CM | POA: Diagnosis not present

## 2020-03-05 DIAGNOSIS — E11649 Type 2 diabetes mellitus with hypoglycemia without coma: Secondary | ICD-10-CM

## 2020-03-05 DIAGNOSIS — I5021 Acute systolic (congestive) heart failure: Secondary | ICD-10-CM | POA: Diagnosis not present

## 2020-03-05 HISTORY — DX: Acute on chronic combined systolic (congestive) and diastolic (congestive) heart failure: I50.43

## 2020-03-05 LAB — COMPREHENSIVE METABOLIC PANEL
ALT: 21 U/L (ref 0–44)
AST: 36 U/L (ref 15–41)
Albumin: 3.7 g/dL (ref 3.5–5.0)
Alkaline Phosphatase: 113 U/L (ref 38–126)
Anion gap: 11 (ref 5–15)
BUN: 22 mg/dL — ABNORMAL HIGH (ref 6–20)
CO2: 26 mmol/L (ref 22–32)
Calcium: 9.2 mg/dL (ref 8.9–10.3)
Chloride: 94 mmol/L — ABNORMAL LOW (ref 98–111)
Creatinine, Ser: 0.88 mg/dL (ref 0.61–1.24)
GFR calc Af Amer: >60 mL/min (ref >60)
GFR calc non Af Amer: >60 mL/min (ref >60)
Glucose, Bld: 311 mg/dL — ABNORMAL HIGH (ref 70–99)
Potassium: 4.5 mmol/L (ref 3.5–5.1)
Sodium: 131 mmol/L — ABNORMAL LOW (ref 135–145)
Total Bilirubin: 1.1 mg/dL (ref 0.3–1.2)
Total Protein: 7.4 g/dL (ref 6.5–8.1)

## 2020-03-05 LAB — CBC WITH DIFFERENTIAL/PLATELET
Abs Immature Granulocytes: 0.18 10*3/uL — ABNORMAL HIGH (ref 0.00–0.07)
Basophils Absolute: 0.1 10*3/uL (ref 0.0–0.1)
Basophils Relative: 1 %
Eosinophils Absolute: 0.4 10*3/uL (ref 0.0–0.5)
Eosinophils Relative: 3 %
HCT: 35.1 % — ABNORMAL LOW (ref 39.0–52.0)
Hemoglobin: 11.1 g/dL — ABNORMAL LOW (ref 13.0–17.0)
Immature Granulocytes: 1 %
Lymphocytes Relative: 15 %
Lymphs Abs: 1.9 10*3/uL (ref 0.7–4.0)
MCH: 28.5 pg (ref 26.0–34.0)
MCHC: 31.6 g/dL (ref 30.0–36.0)
MCV: 90.2 fL (ref 80.0–100.0)
Monocytes Absolute: 0.9 10*3/uL (ref 0.1–1.0)
Monocytes Relative: 7 %
Neutro Abs: 9.2 10*3/uL — ABNORMAL HIGH (ref 1.7–7.7)
Neutrophils Relative %: 73 %
Platelets: 280 10*3/uL (ref 150–400)
RBC: 3.89 MIL/uL — ABNORMAL LOW (ref 4.22–5.81)
RDW: 13.5 % (ref 11.5–15.5)
WBC: 12.6 10*3/uL — ABNORMAL HIGH (ref 4.0–10.5)
nRBC: 0 % (ref 0.0–0.2)

## 2020-03-05 LAB — URINALYSIS, ROUTINE W REFLEX MICROSCOPIC
Bacteria, UA: NONE SEEN
Bilirubin Urine: NEGATIVE
Glucose, UA: >=500 mg/dL — AB
Hgb urine dipstick: NEGATIVE
Ketones, ur: NEGATIVE mg/dL
Leukocytes,Ua: NEGATIVE
Nitrite: NEGATIVE
Protein, ur: 100 mg/dL — AB
Specific Gravity, Urine: 1.014 (ref 1.005–1.030)
pH: 7 (ref 5.0–8.0)

## 2020-03-05 LAB — TROPONIN I (HIGH SENSITIVITY)
Troponin I (High Sensitivity): 81 ng/L — ABNORMAL HIGH (ref <18)
Troponin I (High Sensitivity): 86 ng/L — ABNORMAL HIGH (ref <18)

## 2020-03-05 LAB — D-DIMER, QUANTITATIVE: D-Dimer, Quant: 1.08 ug/mL-FEU — ABNORMAL HIGH (ref 0.00–0.50)

## 2020-03-05 LAB — MAGNESIUM: Magnesium: 1.8 mg/dL (ref 1.7–2.4)

## 2020-03-05 LAB — RESPIRATORY PANEL BY RT PCR (FLU A&B, COVID)
Influenza A by PCR: NEGATIVE
Influenza B by PCR: NEGATIVE
SARS Coronavirus 2 by RT PCR: NEGATIVE

## 2020-03-05 LAB — BRAIN NATRIURETIC PEPTIDE: B Natriuretic Peptide: 463 pg/mL — ABNORMAL HIGH (ref 0.0–100.0)

## 2020-03-05 IMAGING — DX DG CHEST 1V PORT
1 series · 1 of 1 positions shown · non-contrast
Comparison: [DATE]

CLINICAL DATA: Shortness of breath.

EXAM:
PORTABLE CHEST 1 VIEW

[chest ap]
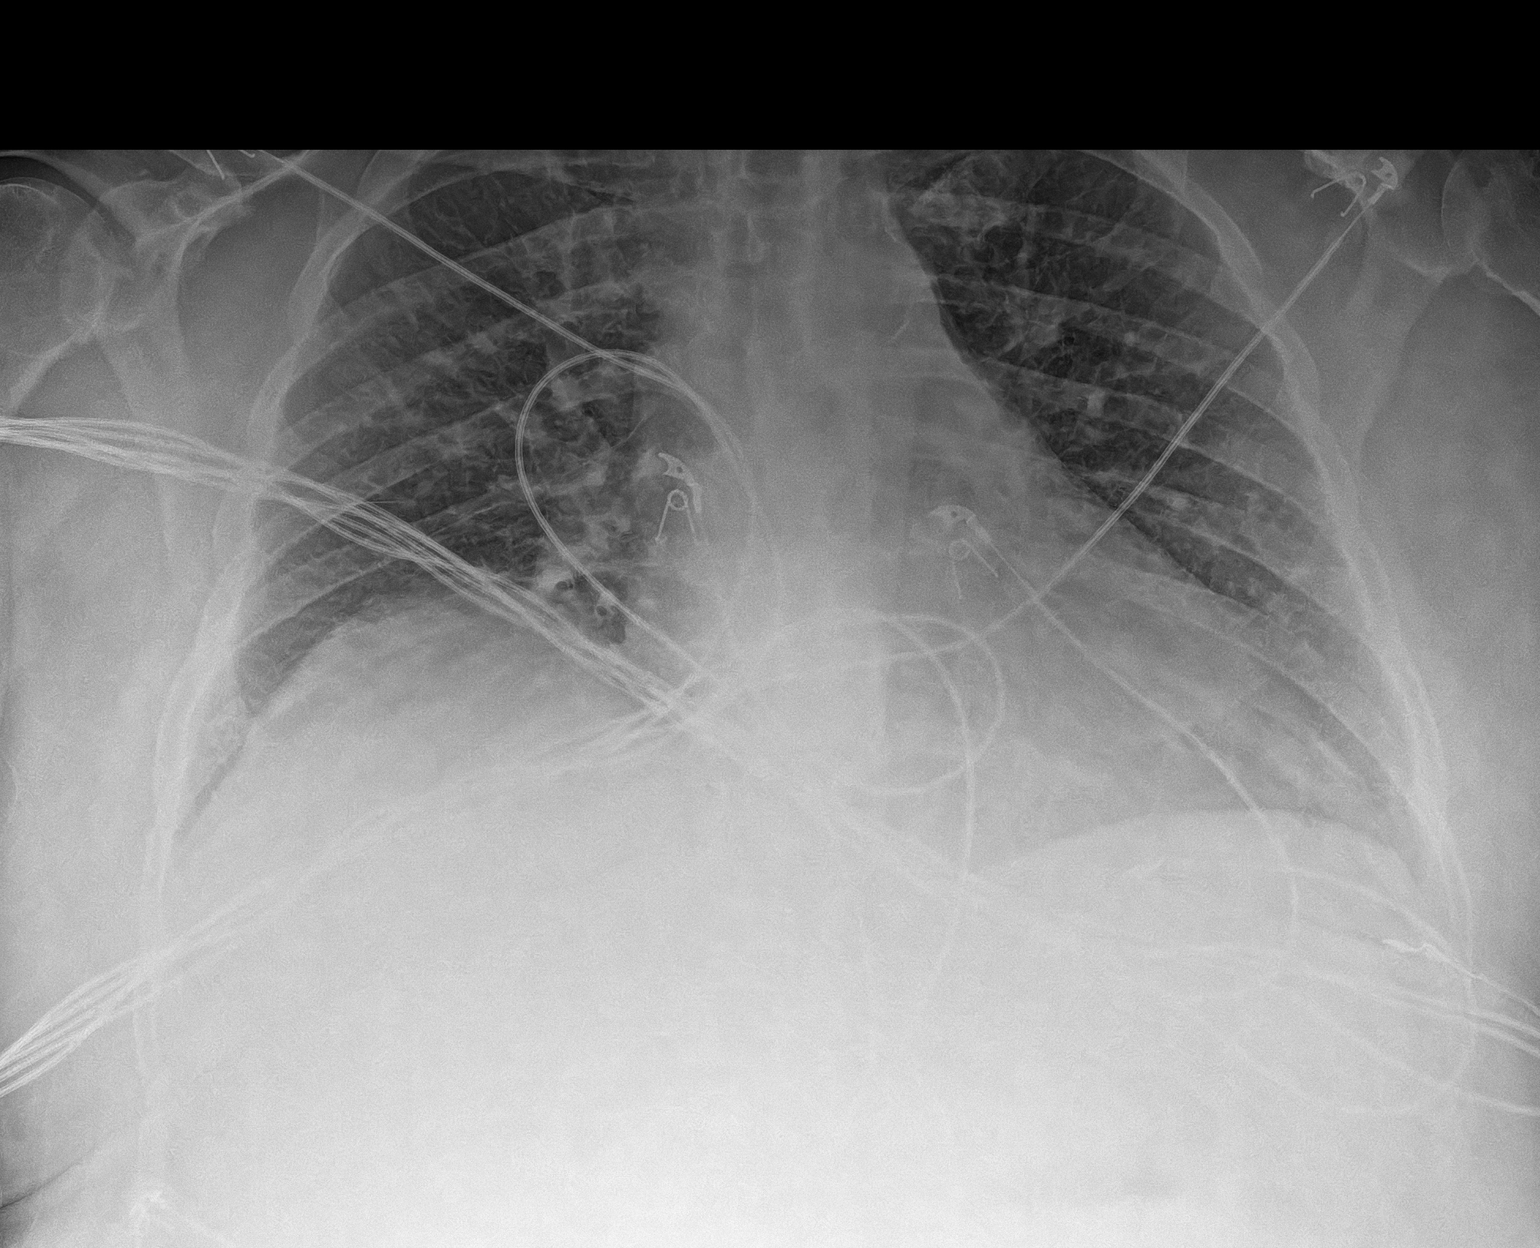

[1 of 1 positions shown; findings below may reference images not displayed]

FINDINGS: The heart is borderline enlarged. The mediastinal and hilar contours
are within normal limits. Slightly prominent interstitial markings
could suggest bronchitis. No definite infiltrates or effusions.

No pulmonary lesions.
IMPRESSION: Slightly prominent interstitial markings could suggest bronchitis.
No definite infiltrates or effusions.

## 2020-03-05 IMAGING — CT CT ANGIO CHEST
2 of 6 series · 18 of 46 positions shown · IV contrast (Omnipaque or Isovue)
Comparison: [DATE].

CLINICAL DATA: Shortness of breath.

EXAM:
CT ANGIOGRAPHY CHEST WITH CONTRAST
TECHNIQUE: Multidetector CT imaging of the chest was performed using the
standard protocol during bolus administration of intravenous
contrast. Multiplanar CT image reconstructions and MIPs were
obtained to evaluate the vascular anatomy.
CONTRAST:  100mL OMNIPAQUE IOHEXOL 350 MG/ML SOLN

[Series 5: pe axial thins · axial · 0.78mm/px · z∈[-260,-8]mm · 15 of 277 slices shown]
[im 13/277  lung]
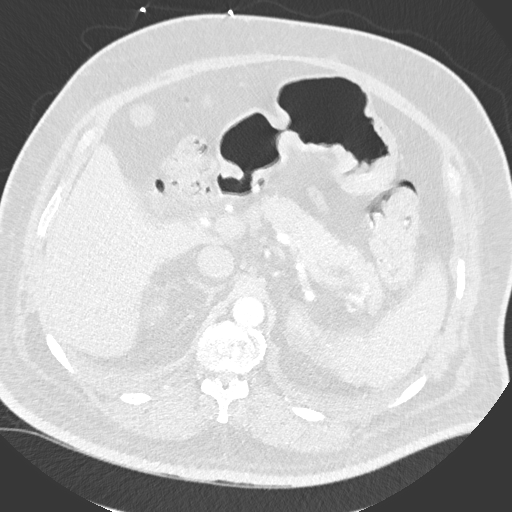
[im 37/277  soft-tissue]
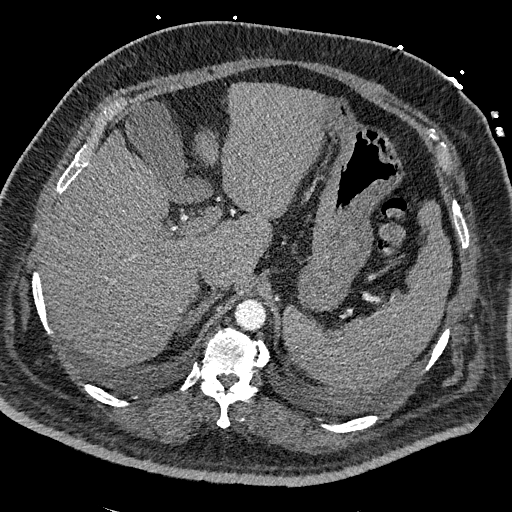
[im 49/277  lung]
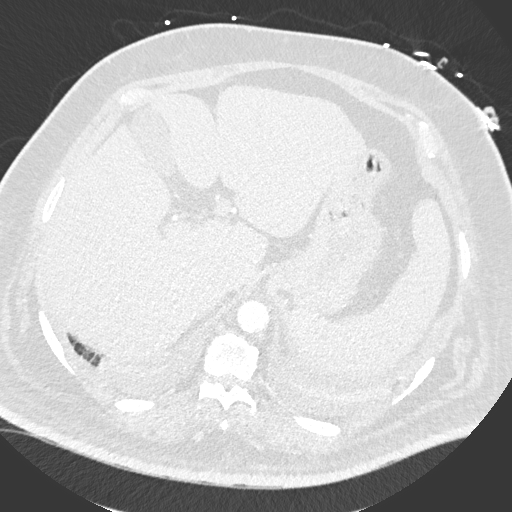
[im 73/277  soft-tissue]
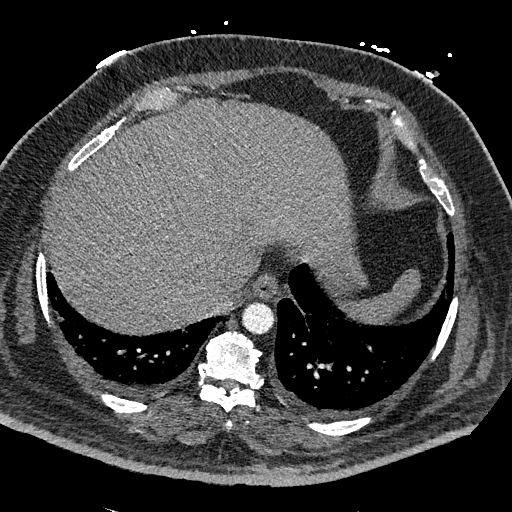
[im 85/277  lung]
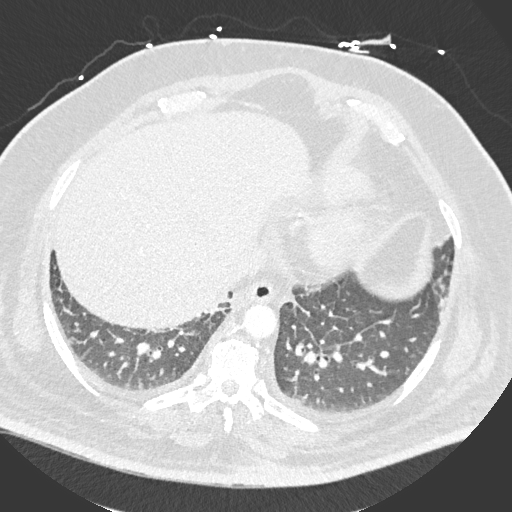
[im 109/277  soft-tissue]
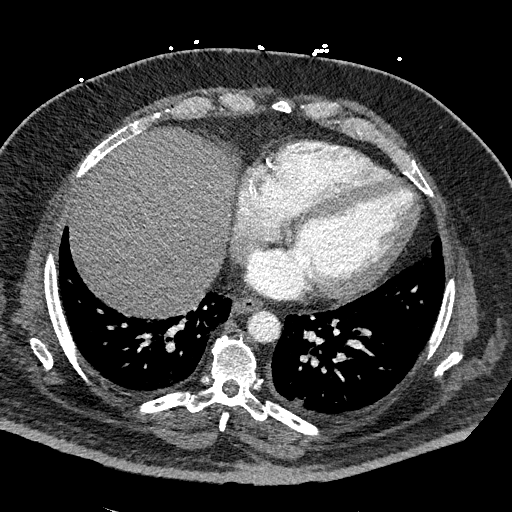
[im 121/277  lung]
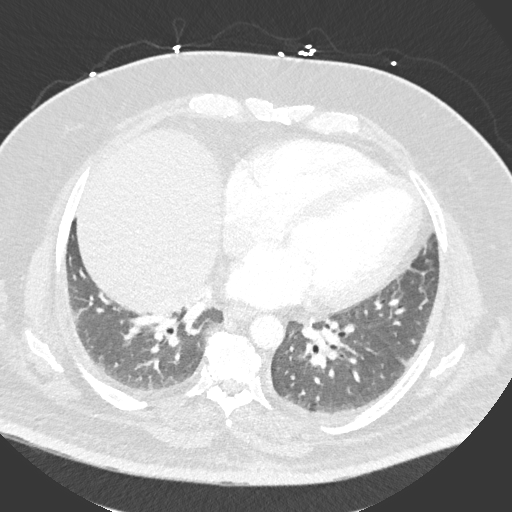
[im 145/277  soft-tissue]
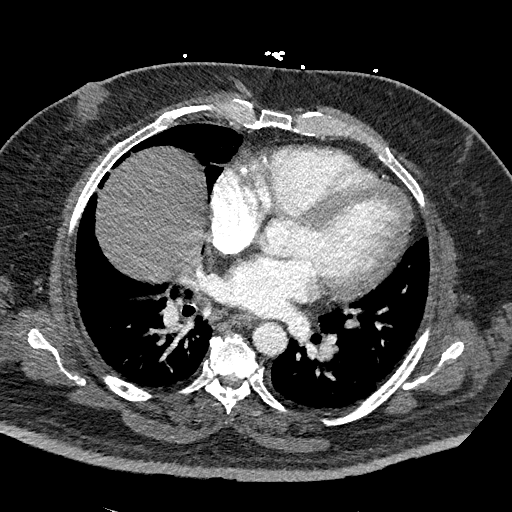
[im 157/277  lung]
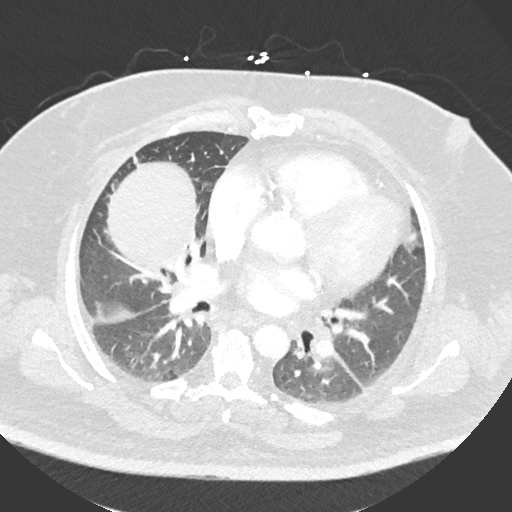
[im 169/277  soft-tissue]
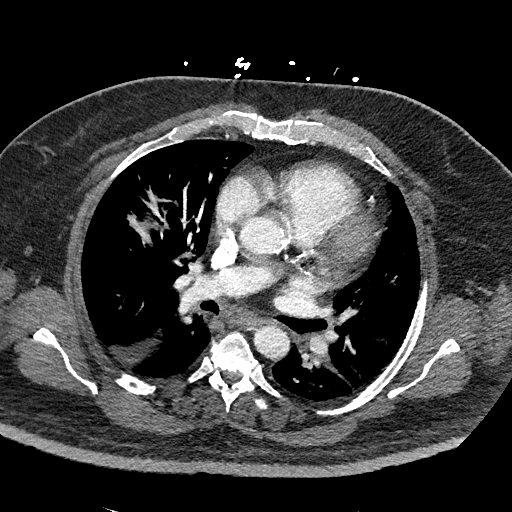
[im 193/277  lung]
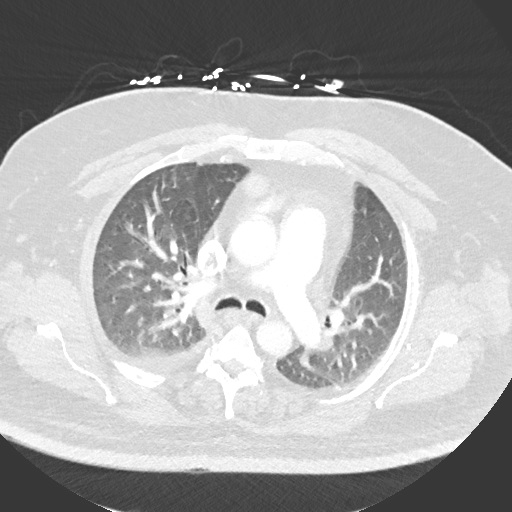
[im 205/277  soft-tissue]
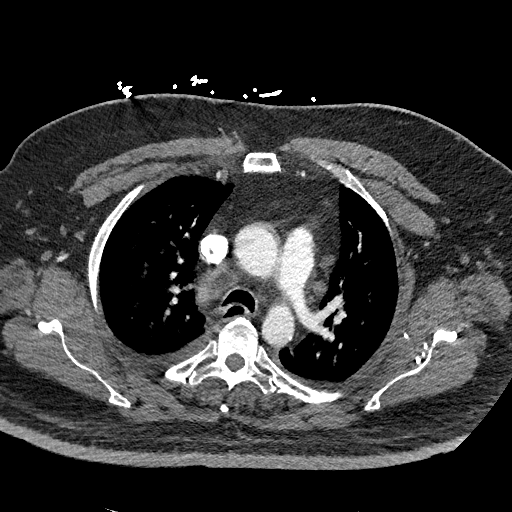
[im 229/277  lung]
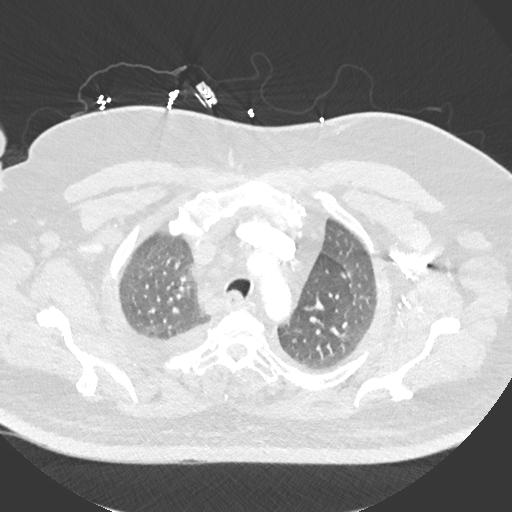
[im 241/277  soft-tissue]
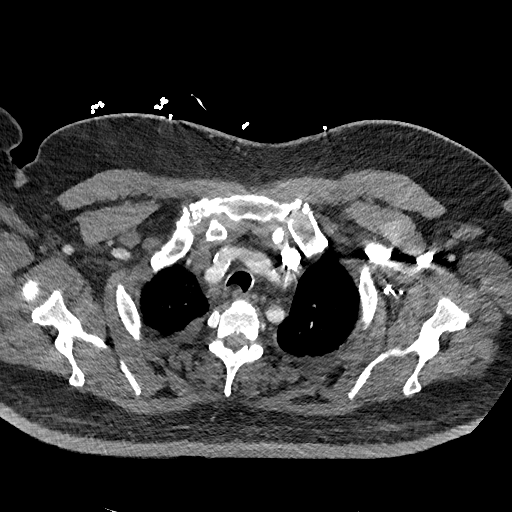
[im 265/277  lung]
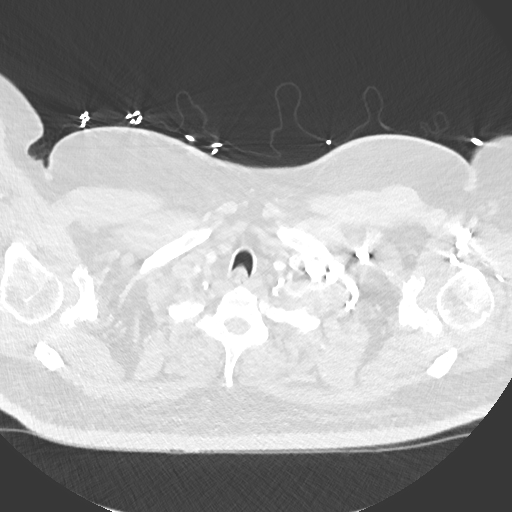

[Series 7: cor soft · coronal · 0.57mm/px · 3 of 171 slices shown]
[im 43/171  soft-tissue]
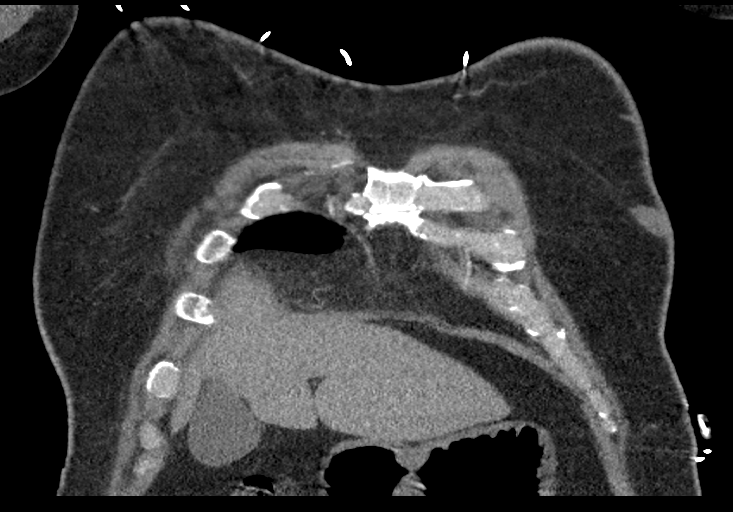
[im 86/171  soft-tissue]
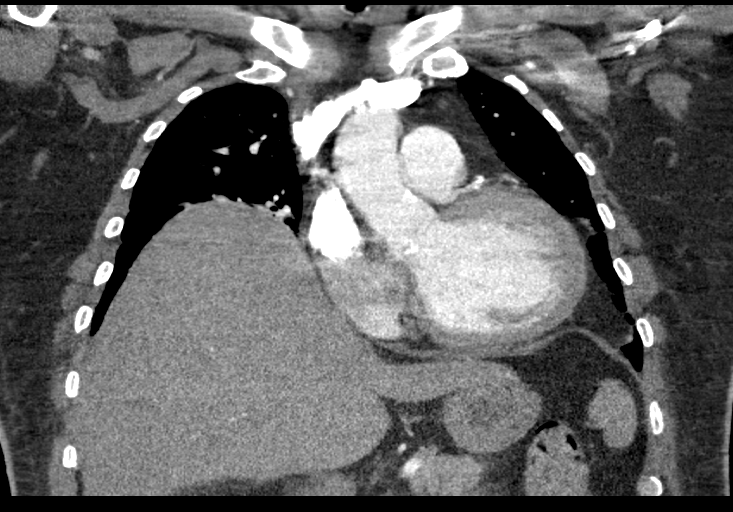
[im 128/171  soft-tissue]
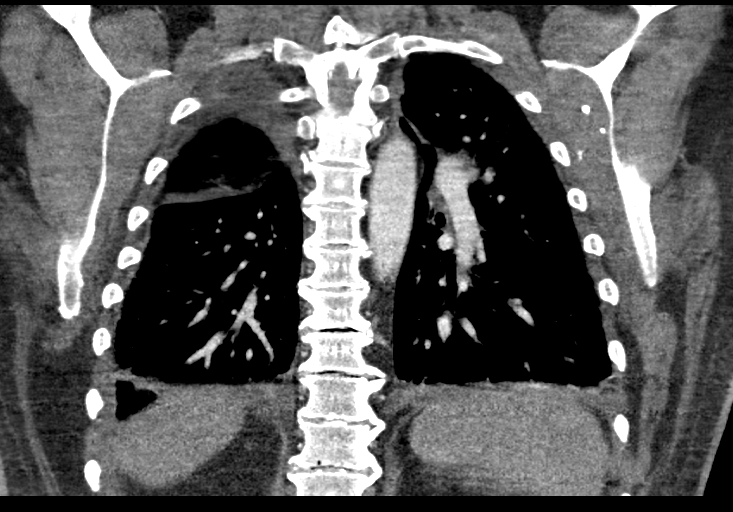

[18 of 46 positions shown; findings below may reference images not displayed]

FINDINGS: Cardiovascular: Satisfactory opacification of the pulmonary arteries
to the segmental level. No evidence of pulmonary embolism. Mild
cardiomegaly. No pericardial effusion. Coronary artery
calcifications are noted.

Mediastinum/Nodes: Stable 1.5 cm anterior mediastinal lymph node is
noted. Stable mildly enlarged right paratracheal and aortopulmonary
window adenopathy is noted. This most likely is reactive in
etiology.

Lungs/Pleura: No pneumothorax is noted. Minimal bilateral pleural
effusions are noted with adjacent subsegmental atelectasis. Mild
right middle lobe subsegmental atelectasis is noted.

Upper Abdomen: No acute abnormality.

Musculoskeletal: No chest wall abnormality. No acute or significant
osseous findings.

Review of the MIP images confirms the above findings.
IMPRESSION: 1. No definite evidence of pulmonary embolus.
2. Coronary artery calcifications are noted suggesting coronary
artery disease.
3. Minimal bilateral pleural effusions are noted with adjacent
subsegmental atelectasis. Mild right middle lobe subsegmental
atelectasis is noted.
4. Stable mediastinal adenopathy is noted which most likely is
reactive in etiology.

## 2020-03-05 MED ORDER — ASPIRIN EC 81 MG PO TBEC
81.0000 mg | DELAYED_RELEASE_TABLET | Freq: Every day | ORAL | Status: DC
  Administered 2020-03-06 – 2020-03-12 (×7): 81 mg via ORAL
  Filled 2020-03-05 (×7): qty 1

## 2020-03-05 MED ORDER — METOPROLOL SUCCINATE ER 50 MG PO TB24
200.0000 mg | ORAL_TABLET | Freq: Every day | ORAL | Status: DC
  Administered 2020-03-06: 200 mg via ORAL
  Filled 2020-03-05: qty 4

## 2020-03-05 MED ORDER — SODIUM CHLORIDE 0.9% FLUSH
3.0000 mL | Freq: Two times a day (BID) | INTRAVENOUS | Status: DC
  Administered 2020-03-06 – 2020-03-10 (×5): 3 mL via INTRAVENOUS

## 2020-03-05 MED ORDER — ACETAMINOPHEN 325 MG PO TABS: 650.0000 mg | ORAL_TABLET | Freq: Four times a day (QID) | ORAL | Status: DC | PRN

## 2020-03-05 MED ORDER — INSULIN ASPART 100 UNIT/ML ~~LOC~~ SOLN: 0.0000 [IU] | Freq: Three times a day (TID) | SUBCUTANEOUS | Status: DC

## 2020-03-05 MED ORDER — INSULIN ASPART 100 UNIT/ML ~~LOC~~ SOLN: 0.0000 [IU] | Freq: Every day | SUBCUTANEOUS | Status: DC

## 2020-03-05 MED ORDER — ALBUTEROL SULFATE (2.5 MG/3ML) 0.083% IN NEBU
2.5000 mg | INHALATION_SOLUTION | Freq: Four times a day (QID) | RESPIRATORY_TRACT | Status: DC | PRN
  Administered 2020-03-06 (×2): 2.5 mg via RESPIRATORY_TRACT
  Filled 2020-03-05 (×2): qty 3

## 2020-03-05 MED ORDER — ALBUTEROL SULFATE (2.5 MG/3ML) 0.083% IN NEBU
2.5000 mg | INHALATION_SOLUTION | Freq: Once | RESPIRATORY_TRACT | Status: AC
  Administered 2020-03-05: 2.5 mg via RESPIRATORY_TRACT
  Filled 2020-03-05: qty 3

## 2020-03-05 MED ORDER — INSULIN GLARGINE 100 UNIT/ML ~~LOC~~ SOLN
10.0000 [IU] | Freq: Two times a day (BID) | SUBCUTANEOUS | Status: DC
  Administered 2020-03-06 – 2020-03-07 (×4): 10 [IU] via SUBCUTANEOUS
  Filled 2020-03-05 (×8): qty 0.1

## 2020-03-05 MED ORDER — FERROUS SULFATE 325 (65 FE) MG PO TABS
325.0000 mg | ORAL_TABLET | Freq: Every day | ORAL | Status: DC
  Administered 2020-03-06 – 2020-03-12 (×7): 325 mg via ORAL
  Filled 2020-03-05 (×7): qty 1

## 2020-03-05 MED ORDER — METHYLPREDNISOLONE SODIUM SUCC 125 MG IJ SOLR
125.0000 mg | Freq: Once | INTRAMUSCULAR | Status: AC
  Administered 2020-03-05: 125 mg via INTRAVENOUS
  Filled 2020-03-05: qty 2

## 2020-03-05 MED ORDER — ATORVASTATIN CALCIUM 40 MG PO TABS
40.0000 mg | ORAL_TABLET | Freq: Every day | ORAL | Status: DC
  Administered 2020-03-05 – 2020-03-07 (×3): 40 mg via ORAL
  Filled 2020-03-05 (×3): qty 1

## 2020-03-05 MED ORDER — ACETAMINOPHEN 650 MG RE SUPP: 650.0000 mg | Freq: Four times a day (QID) | RECTAL | Status: DC | PRN

## 2020-03-05 MED ORDER — TAMSULOSIN HCL 0.4 MG PO CAPS
0.4000 mg | ORAL_CAPSULE | Freq: Every day | ORAL | Status: DC
  Administered 2020-03-06 – 2020-03-12 (×7): 0.4 mg via ORAL
  Filled 2020-03-05 (×7): qty 1

## 2020-03-05 MED ORDER — IOHEXOL 350 MG/ML SOLN
100.0000 mL | Freq: Once | INTRAVENOUS | Status: AC | PRN
  Administered 2020-03-05: 100 mL via INTRAVENOUS

## 2020-03-05 MED ORDER — ASPIRIN 325 MG PO TABS
325.0000 mg | ORAL_TABLET | Freq: Once | ORAL | Status: AC
  Administered 2020-03-05: 325 mg via ORAL
  Filled 2020-03-05: qty 1

## 2020-03-05 MED ORDER — FUROSEMIDE 10 MG/ML IJ SOLN
20.0000 mg | Freq: Once | INTRAMUSCULAR | Status: AC
  Administered 2020-03-05: 20 mg via INTRAVENOUS
  Filled 2020-03-05: qty 2

## 2020-03-05 MED ORDER — ENOXAPARIN SODIUM 40 MG/0.4ML ~~LOC~~ SOLN: 40.0000 mg | Freq: Every day | SUBCUTANEOUS | Status: DC

## 2020-03-05 MED ORDER — FUROSEMIDE 10 MG/ML IJ SOLN
40.0000 mg | Freq: Two times a day (BID) | INTRAMUSCULAR | Status: DC
  Administered 2020-03-06: 40 mg via INTRAVENOUS
  Filled 2020-03-05: qty 4

## 2020-03-05 MED ORDER — FUROSEMIDE 10 MG/ML IJ SOLN
60.0000 mg | Freq: Once | INTRAMUSCULAR | Status: AC
  Administered 2020-03-05: 60 mg via INTRAVENOUS
  Filled 2020-03-05: qty 6

## 2020-03-05 MED ORDER — GABAPENTIN 300 MG PO CAPS
900.0000 mg | ORAL_CAPSULE | Freq: Two times a day (BID) | ORAL | Status: DC
  Administered 2020-03-05 – 2020-03-12 (×15): 900 mg via ORAL
  Filled 2020-03-05 (×15): qty 3

## 2020-03-05 NOTE — H&P (Signed)
History and Physical    PLEASE NOTE THAT DRAGON DICTATION SOFTWARE WAS USED IN THE CONSTRUCTION OF THIS NOTE.   Shane Chambers [id number] DOB: Jul 13, 1961 DOA: 03/05/2020  PCP: [name], MD Patient coming from: home   I have personally briefly reviewed patient's old medical records in [place]  Chief Complaint: Shortness of breath  HPI: Shane Chambers is a 59 y.o. male with medical history significant for type 2 diabetes mellitus complicated by peripheral polyneuropathy, hypertension, BPH, chronically elevated troponin, who is admitted to [facility] on 03/05/2020 with suspected acute on chronic diastolic heart failure after presenting from home to [facility] Emergency Department complaining of shortness of breath.   The patient reports 3 to 4 days of progressive shortness of breath associated with orthopnea and increased peripheral edema.  He denies any associated chest pain, palpitations, diaphoresis, nausea, vomiting, dizziness, presyncope, or syncope.  Denies any associated cough, hemoptysis, or subjective fever, chills, rigors, or generalized myalgias.  Denies any associated calf tenderness or erythema associate with the bilateral lower extremities.  Denies any recent trauma or travel.  Not associate with any recent diarrhea, melena, or hematochezia.  Denies any associated wheezing.  The patient reports that he does not have a scale at home, and is therefore unsure as to any recent change in body weight.  Denies any recent headache, neck stiffness, rhinitis, rhinorrhea, sore throat, abdominal pain, or rash. Denies dysuria, gross hematuria, or change in urinary urgency/frequency.    Of note, per chart review, it appears that the patient was hospitalized twice in January 2021, with the latter hospitalization on the basis of COVID-19 pneumonia, with positive Covid PCR on 12/01/2019.  Subsequently, he was rehospitalized in February 2021 for acute volume overload after  presenting with shortness of breath, during which time he was diuresed, with resultant net negative fluid balance over the course of the hospitalization of 12.5 L.  At the time of discharge, he was started on Lasix 20 mg p.o. daily, with which he reports good insulin compliance.  No supplemental oxygen requirements at the time of this previous discharge.  Of note, it appears that the patient has a chronically elevated troponin I, with elevation ranging from 22-74.   Most recent echocardiogram, which was performed on 12/21/2019, showed moderate LVH, LVEF 50 to 55%, had no evidence of focal wall motion abnormalities, with indeterminate diastolic function, and no evidence of significant valvular pathology.    ED Course:  Vital signs in the ED were notable for the following: Temperature max 98.8; heart rate 86-1 01; blood pressure 138/76 - 162/82; respiratory rate 20-26; oxygen saturation initially noted to be 88% on room air, with ensuing improvement to 95 to 97% on 4 L nasal cannula.  Labs were notable for the following: CMP notable for the following: Sodium 131, corrected to 134 after adjustment for hyperglycemia, potassium 4.5, chloride 94, bicarbonate 26, creatinine 0.88 relative to most recent prior creatinine of 0.84 on 12/24/2019, and liver enzymes were found to be within normal limits.  BNP 463 relative to most recent prior value of 264 on 12/20/2019; high-sensitivity troponin I initially found to be 81, with repeat value noted to be 86, relative to chronically elevated range of 74; CBC notable for white blood cell count of 12,600 with 73% neutrophils, unchanged from his recent prior blood cell count of 12,600 on 12/24/2019, hemoglobin 11.  Urinalysis showed no white blood cells, no bacteria, and was nitrite and leukocyte esterase negative; D-dimer 1.08.  Nasopharyngeal COVID-19 PCR was found be negative.  Presenting EKG showed sinus rhythm with ventricular rate 80, nonspecific T wave inversions in leads  III and aVF, and no evidence of ST changes, including no evidence of ST elevation.  Chest x-ray showed increased interstitial markings without overt infiltrate or effusion.  In the setting of shortness of breath with elevated presenting D-dimer, CTA of the chest was pursued and showed no evidence of acute pulmonary embolism, but did show evidence of cardiomegaly, small bilateral pleural effusions, and no evidence of pneumothorax or infiltrate.  While in the ED, the following were administered: Albuterol nebulizer x1, Solu-Medrol 125 mg IV x1, and Lasix 60 mg IV x1.  Subsequently, the patient was admitted to the med telemetry unit for further evaluation and management of presenting acute hypoxic respiratory failure due to suspected acute on chronic diastolic heart failure.    Review of Systems: As per HPI otherwise 10 point review of systems negative.   Past Medical History:  Diagnosis Date   Depression    Diabetes mellitus without complication ([place])    Hyperlipidemia 12/01/2019   Hypertension    Type 2 diabetes mellitus ([place])     Past Surgical History:  Procedure Laterality Date   DENTAL SURGERY      Social History:  reports that he has quit smoking. He has never used smokeless tobacco. He reports current alcohol use. He reports that he does not use drugs.   Allergies  Allergen Reactions   Ace Inhibitors Swelling and Cough   Other Itching    [name]   Penicillins     Has patient had a PCN reaction causing immediate rash, facial/tongue/throat swelling, SOB or lightheadedness with hypotension: Unknown Has patient had a PCN reaction causing severe rash involving mucus membranes or skin necrosis: Unknown Has patient had a PCN reaction that required hospitalization: Unknown Has patient had a PCN reaction occurring within the last 10 years: Unknown If all of the above answers are "NO", then may proceed with Cephalosporin use.     Family History  Problem Relation Age of  Onset   Hypertension Mother      Prior to Admission medications   Medication Sig Start Date End Date Taking? Authorizing Provider  albuterol (VENTOLIN HFA) 108 (90 Base) MCG/ACT inhaler Inhale 1-2 puffs into the lungs every 6 (six) hours as needed for wheezing or shortness of breath.  07/27/19   [provider]  amLODipine (NORVASC) 10 MG tablet Take 10 mg by mouth daily.    [provider]  aspirin 81 MG EC tablet Take 81 mg by mouth daily.  03/10/16   [provider]  atorvastatin (LIPITOR) 40 MG tablet Take 40 mg by mouth daily. 10/20/19   [provider]  docusate sodium (COLACE) 100 MG capsule Take 1 capsule (100 mg total) by mouth 2 (two) times daily. 12/25/19   Manuella Ghazi, Pratik D, DO  fluticasone (VERAMYST) 27.5 MCG/SPRAY nasal spray Place 2 sprays into the nose daily as needed for rhinitis or allergies.     [provider]  furosemide (LASIX) 20 MG tablet Take 1 tablet (20 mg total) by mouth daily. Take an extra 20mg  by mouth if noted to have greater than 3 lb weight gain in 24 hours. 12/25/19 12/24/20  Manuella Ghazi, Pratik D, DO  gabapentin (NEURONTIN) 300 MG capsule Take 900 mg by mouth 2 (two) times daily.     [provider]  indomethacin (INDOCIN) 25 MG capsule Take 25 mg by mouth 2 (  two) times daily as needed for mild pain or moderate pain.  11/24/19   [provider]  insulin aspart protamine- aspart (NOVOLOG MIX 70/30) (70-30) 100 UNIT/ML injection Inject 0.55 mLs (55 Units total) into the skin 2 (two) times daily with a meal. 12/25/19   Manuella Ghazi, Pratik D, DO  Iron, Ferrous Sulfate, 325 (65 Fe) MG TABS Take 1 tablet by mouth daily. 12/06/19   Johnson, Clanford L, MD  metFORMIN (GLUCOPHAGE) 1000 MG tablet Take 1,000 mg by mouth 2 (two) times daily with a meal.    [provider]  metoprolol (TOPROL-XL) 200 MG 24 hr tablet Take 200 mg by mouth daily.    [provider]  Multiple Vitamins-Minerals (TAB-A-VITE) TABS Take 1  tablet by mouth daily. 07/27/19   [provider]  omeprazole (PRILOSEC) 40 MG capsule Take 40 mg by mouth daily.    [provider]  polyethylene glycol (MIRALAX / GLYCOLAX) 17 g packet Take 17 g by mouth daily as needed for mild constipation. 12/25/19   Manuella Ghazi, Pratik D, DO  potassium chloride (KLOR-CON) 10 MEQ tablet Take 1 tablet (10 mEq total) by mouth daily. 12/25/19 01/24/20  Manuella Ghazi, Pratik D, DO  tamsulosin (FLOMAX) 0.4 MG CAPS capsule Take 0.4 mg by mouth daily.    [provider]  venlafaxine XR (EFFEXOR-XR) 150 MG 24 hr capsule Take 150 mg by mouth daily with breakfast.    [provider]     Objective    Physical Exam: Vitals:   03/05/20 1800 03/05/20 1806 03/05/20 1815 03/05/20 1820  BP: (!) 165/89     Pulse: 93 92 91 91  Resp: (!) 22 (!) 27 (!) 26 17  Temp:      TempSrc:      SpO2: 96% 94% 95% 96%  Weight:      Height:        General: appears to be stated age; alert, oriented; increased wob noted;  Skin: warm, dry, no rash Head:  AT/[place] Eyes:  PEARL b/l, EOMI Mouth:  Oral mucosa membranes appear moist, normal dentition Neck: supple; trachea midline Heart: Mildly tachycardic, but regular; did not appreciate any M/R/G Lungs: CTAB, did not appreciate any wheezes, rales, or rhonchi Abdomen: + BS; soft, ND, NT Vascular: 2+ pedal pulses b/l; 2+ radial pulses b/l Extremities: 1-2+ edema in b/l LE's; no muscle wasting Neuro: strength and sensation intact in upper and lower extremities b/l  Labs on Admission: I have personally reviewed following labs and imaging studies  CBC: Recent Labs  Lab 03/05/20 1330  WBC 12.6*  NEUTROABS 9.2*  HGB 11.1*  HCT 35.1*  MCV 90.2  PLT 123456   Basic Metabolic Panel: Recent Labs  Lab 03/05/20 1330  NA 131*  K 4.5  CL 94*  CO2 26  GLUCOSE 311*  BUN 22*  CREATININE 0.88  CALCIUM 9.2   GFR: Estimated Creatinine Clearance: 125.9 mL/min (by C-G formula based on SCr of 0.88 mg/dL). Liver  Function Tests: Recent Labs  Lab 03/05/20 1330  AST 36  ALT 21  ALKPHOS 113  BILITOT 1.1  PROT 7.4  ALBUMIN 3.7   No results for input(s): LIPASE, AMYLASE in the last 168 hours. No results for input(s): AMMONIA in the last 168 hours. Coagulation Profile: No results for input(s): INR, PROTIME in the last 168 hours. Cardiac Enzymes: No results for input(s): CKTOTAL, CKMB, CKMBINDEX, TROPONINI in the last 168 hours. BNP (last 3 results) No results for input(s): PROBNP in the last 8760 hours.  HbA1C: No results for input(s): HGBA1C in the last 72 hours. CBG: No results for input(s): GLUCAP in the last 168 hours. Lipid Profile: No results for input(s): CHOL, HDL, LDLCALC, TRIG, CHOLHDL, LDLDIRECT in the last 72 hours. Thyroid Function Tests: No results for input(s): TSH, T4TOTAL, FREET4, T3FREE, THYROIDAB in the last 72 hours. Anemia Panel: No results for input(s): VITAMINB12, FOLATE, FERRITIN, TIBC, IRON, RETICCTPCT in the last 72 hours. Urine analysis:    Component Value Date/Time   COLORURINE YELLOW 03/05/2020 1251   APPEARANCEUR CLEAR 03/05/2020 1251   LABSPEC 1.014 03/05/2020 1251   PHURINE 7.0 03/05/2020 1251   GLUCOSEU >=500 (A) 03/05/2020 1251   HGBUR NEGATIVE 03/05/2020 1251   BILIRUBINUR NEGATIVE 03/05/2020 1251   KETONESUR NEGATIVE 03/05/2020 1251   PROTEINUR 100 (A) 03/05/2020 1251   NITRITE NEGATIVE 03/05/2020 1251   LEUKOCYTESUR NEGATIVE 03/05/2020 1251    Radiological Exams on Admission: CT Angio Chest PE W/Cm &/Or Wo Cm  Result Date: 03/05/2020 CLINICAL DATA:  Shortness of breath. EXAM: CT ANGIOGRAPHY CHEST WITH CONTRAST TECHNIQUE: Multidetector CT imaging of the chest was performed using the standard protocol during bolus administration of intravenous contrast. Multiplanar CT image reconstructions and MIPs were obtained to evaluate the vascular anatomy. CONTRAST:  163mL OMNIPAQUE IOHEXOL 350 MG/ML SOLN COMPARISON:  December 20, 2019. FINDINGS: Cardiovascular:  Satisfactory opacification of the pulmonary arteries to the segmental level. No evidence of pulmonary embolism. Mild cardiomegaly. No pericardial effusion. Coronary artery calcifications are noted. Mediastinum/Nodes: Stable 1.5 cm anterior mediastinal lymph node is noted. Stable mildly enlarged right paratracheal and aortopulmonary window adenopathy is noted. This most likely is reactive in etiology. Lungs/Pleura: No pneumothorax is noted. Minimal bilateral pleural effusions are noted with adjacent subsegmental atelectasis. Mild right middle lobe subsegmental atelectasis is noted. Upper Abdomen: No acute abnormality. Musculoskeletal: No chest wall abnormality. No acute or significant osseous findings. Review of the MIP images confirms the above findings. IMPRESSION: 1. No definite evidence of pulmonary embolus. 2. Coronary artery calcifications are noted suggesting coronary artery disease. 3. Minimal bilateral pleural effusions are noted with adjacent subsegmental atelectasis. Mild right middle lobe subsegmental atelectasis is noted. 4. Stable mediastinal adenopathy is noted which most likely is reactive in etiology. Electronically Signed   By: Marijo Conception M.D.   On: 03/05/2020 17:39   DG Chest Port 1 View  Result Date: 03/05/2020 CLINICAL DATA:  Shortness of breath. EXAM: PORTABLE CHEST 1 VIEW COMPARISON:  12/24/2019 FINDINGS: The heart is borderline enlarged. The mediastinal and hilar contours are within normal limits. Slightly prominent interstitial markings could suggest bronchitis. No definite infiltrates or effusions. No pulmonary lesions. IMPRESSION: Slightly prominent interstitial markings could suggest bronchitis. No definite infiltrates or effusions. Electronically Signed   By: Marijo Sanes M.D.   On: 03/05/2020 13:27     EKG: Independently reviewed, with result as described above.    Assessment/Plan   Shane Chambers is a 59 y.o. male with medical history significant for type 2 diabetes  mellitus complicated by peripheral polyneuropathy, hypertension, BPH, chronically elevated troponin, who is admitted to [facility] on 03/05/2020 with suspected acute on chronic diastolic heart failure after presenting from home to [facility] Emergency Department complaining of shortness of breath.    Principal Problem:   Acute on chronic diastolic heart failure (HCC) Active Problems:   SOB (shortness of breath)   Hypertension   Type 2 diabetes mellitus (HCC)   Hyperlipidemia   BPH (benign prostatic hyperplasia)   Acute respiratory failure with hypoxia (  [place])   Acute hyponatremia   #) Acute hypoxic respiratory failure due to suspected acute on chronic diastolic heart failure: In the context of no baseline supplemental oxygen requirements, presenting oxygen saturation noted to be in the high 80s on room air, subsequent improvement to 95 to 97% on 4 L nasal cannula in the context of presenting 3 to 4 days of progressive shortness of breath associated with orthopnea and worsening edema in the bilateral lower extremities, with evidence of increased work of breathing, elevated BNP that is nearly doubled most recent prior value, and CTA evidence of bilateral pleural effusions in the context of cardiomegaly.  Most recent echocardiogram occurred in February 2021, with results as currently described above, with chronic diastolic heart failure clinically suspected in the context of associated indeterminate diastolic function as well as hospitalization in February 2021 for acute volume overload.  Outpatient diuretic regimen consists of Lasix 20 mg p.o. daily.  Etiology leading to suspected presenting acutely decompensated heart failure is not completely clear at the present time, although additional optimization of afterload reduction intervention may subsequently improve cardiac output, particular given the patient's body habitus with its associated contribution to total peripheral resistance. As patient  is already on a BB at home, will plan to continue such. Will closely monitor ensuing HR as an additional means to evaluate volume status and help guide subsequent diuresis decision-making.   ACS is felt to be less likely in the absence of any associated chest pain, while presenting EKG shows sinus rhythm without evidence of acute ischemic changes.  Furthermore, within the context of a history of chronically elevated high-sensitivity troponin I, patient's troponin in the ED has been noted to be mildly elevated, slightly outside of aforementioned chronically elevated range, which is believed to be has a result of troponin leak due to supply demand mismatch as a consequence of acutely decompensated heart failure as opposed to representing a primary factor leading to the patient's heart failure.  Lasix 60 mg IV x1 administered in the ED this evening.   In terms of other potential etiologies contributing to presenting acute hypoxic respiratory failure: CTA of the chest shows no evidence of acute pulmonary embolism or pneuomothorax.  No clinical or radiographic evidence to suggest underlying pneumonia, while COVID-19 PCR was found to be negative per nasopharyngeal swab performed in the ED this evening.  Acute bronchitis is clinically felt to be less likely relative to the above.   Plan: Lasix 20 mg IV x1 now, followed by Lasix 40 mg IV twice daily, which will represent a 4-8 fold increase in effective dose relative to outpatient diuretic dosing. monitor strict I's & O's and daily weights. Monitor on telemetry, including trend in HR in response to diuresis, as above. Monitor continuous pulse oximetry. Repeat BMP in the morning, including for monitoring of trend of potassium, bicarbonate, and renal function in response to interval diuresis efforts. Check serum magnesium level.  Close monitoring of ensuing blood pressure to help dictate need for additional afterload reduction in order to increase cardiac output.   Continue outpatient metoprolol succinate.  Check ABG.      #) Acute hypoosmolar hyponatremia: Presenting serum sodium noted to be 131, which corrects to 134 following adjustment for concomitant presenting hyperglycemia.  This is relative to most recent prior serum sodium value of 138 when checked on 12/24/2019, and is suspected to represent acute hypervolemic hypoosmolar hyponatremia on the basis of suspected presenting acutely decompensated heart failure, as further described above.   Plan: Check  random urine sodium as well as urine osmolality.  Monitor strict I's and O's and daily weights.  Repeat BMP in the morning.  Work-up and management of acutely decompensated heart failure, including plan for wheezes, as further described above.  In the setting of suspected hypervolemic hyponatremia, will also check INR to evaluate synthetic function of the liver.  Check TSH.     #) Elevated troponin: mildly elevated initial high-sensitivity troponin I of 81, with repeat value of 86; this is relative to a history of chronically elevated high-sensitivity troponin I, with prior range noted to be 22-74; Suspect that this mildly elevated troponin is on the basis of supply demand mismatch in the setting of presenting acute on chronic diastolic heart, with additional contribution from associated acute hypoxic respiratory failure as opposed to representing a type I process due to acute plaque rupture.  As further described above, presentation has not been associated with any chest pain, while presenting EKG shows no evidence of acute ischemic changes, including no evidence of STEMI. Additionally, CTA shows no evidence of acute PE. Overall, ACS is felt to be less likely relative to type 2 supply demand mismatch, as above, but will closely monitor on telemetry overnight while treating suspected underlying acute on chronic diastolic heart failure with resultant acute hypoxic respiratory failure, as further described  above.   Plan: repeat troponin in the AM. Monitor on telemetry. PRN EKG for development of chest pain. Check serum Mg level and repeat BMP in the morning, with prn supplementation to maintain Mg and potassium levels greater than or equal to 2.0 and 4.0, respectively, to further reduce risk of ventricular arrhythmia.  Monitor continuous pulse oximetry.  He has a 325 milligrams p.o. x1.  Resume outpatient high intensity atorvastatin, with next dose now. additional evaluation and management of presenting acute on chronic diastolic heart failure as suspected driving force behind mildly elevated troponin, as above.      #) Type 2 diabetes mellitus: Historically poorly controlled as an outpatient, with most recent hemoglobin A1c noted to be 9.8% on 12/25/2019; insulin regimen consists of insulin 70/30, 55 units sq twice daily.  He is also on Metformin at home.  Presented blood sugar noted to be 311 per result of presenting CMP.  Patient's diabetes appears to be complicated by peripheral polyneuropathy, for which she is on scheduled gabapentin at home.   Plan: will initially proceed conservatively with basal insulin, such that I have initially ordered Lantus 10 units subcu twice daily, with first dose now.  Accu-Cheks q. before meals and at bedtime with associated moderate dose sliding scale insulin.  Hold home Metformin during this hospitalization.     #) Essential hypertension: Outpatient antihypertensive regimen includes Norvasc as well as Lasix.  This is in addition to the potential antihypertensive implications from home tamsulosin.  Presenting systolic blood pressure noted to be in the range of the 130's - 160's mmHg. would likely benefit from optimization of afterload reduction in the context of multiple recent hospitalizations suspected to have basis of acutely decompensated heart failure.  Plan: Holding home po Lasix in lieu of of IV dosing as management of presenting acute on chronic diastolic  heart failure, as prescribed above.  Close monitoring considering blood pressure via routine vital signs for evaluation of response to IV diuresis.  Reassess blood pressure in the morning for determination of timing of resumption of Norvasc.     #) Hyperlipidemia: On high intensity atorvastatin at home.  Plan: Continue home statin.      #)  Benign prostatic hyperplasia: On tamsulosin at home.  Plan: Continue home tamsulosin.  Monitor strict I's and O's and daily weights.  Repeat BMP in the morning.    DVT prophylaxis: Lovenox 40 mg subcu daily Code Status: Full code Family Communication: none Disposition Plan: Per Rounding Team Consults called: none  Admission status: Inpatient; med telemetry.    PLEASE NOTE THAT DRAGON DICTATION SOFTWARE WAS USED IN THE CONSTRUCTION OF THIS NOTE.   [place] [facility] Pager [phone] From 3PM- 11PM.   Otherwise, please contact night-coverage  [email or web address] Password [facility]  03/05/2020, 6:24 PM

## 2020-03-05 NOTE — ED Provider Notes (Signed)
Blood pressure (!) 142/80, pulse 74, temperature 98.8 F (37.1 C), temperature source Oral, resp. rate (!) 22, height 5\' 9"  (1.753 m), weight 134.3 kg, SpO2 95 %.  Assuming care from Dr. Rogene Houston.  In short, Shane Chambers is a 59 y.o. male with a chief complaint of Shortness of Breath .  Refer to the original H&P for additional details.  The current plan of care is to f/u after nebs, CTA, and see if patient can be weaned off O2.  5:56 PM Back to reassess patient after his CT imaging resulted.  His repeat troponin is 86 up from 81.  He is not having active chest pain.  I doubt primary ACS here.  No evidence of PE on CT scan.  He does have minimal bilateral pleural effusions with some adjacent atelectasis.  No clear infiltrate.  In listening to the patient.  He does not have appreciable wheezes but he is tachypneic and speaking in 3-4 word sentences.  No overt edema on CT.  He does have pitting edema in the lower extremities.  Plan for Lasix here.  Is already been treated for possible bronchitis component to his symptoms. No abx for now. Continues on 2L [place]. Will admit for diuresis and further monitoring.   Discussed patient's case with [facility], Dr. Velia Meyer to request admission. Patient and family (if present) updated with plan. Care transferred to [facility] service.  I reviewed all nursing notes, vitals, pertinent old records, EKGs, labs, imaging (as available).   CRITICAL CARE Performed by: Margette Fast Total critical care time:35 minutes Critical care time was exclusive of separately billable procedures and treating other patients. Critical care was necessary to treat or prevent imminent or life-threatening deterioration. Critical care was time spent personally by me on the following activities: development of treatment plan with patient and/or surrogate as well as nursing, discussions with consultants, evaluation of patient's response to treatment, examination of patient, obtaining history from  patient or surrogate, ordering and performing treatments and interventions, ordering and review of laboratory studies, ordering and review of radiographic studies, pulse oximetry and re-evaluation of patient's condition.  [name], MD Emergency Medicine     [name], [name], MD 03/05/20 [phone]

## 2020-03-05 NOTE — ED Triage Notes (Signed)
Pt brought to ED with complaints of SOB x 2 weeks, progressively gotten worse last 2 days. Pt was placed on 3L per EMS for sat 92% came up to 97%. Pt denies fevers at home.

## 2020-03-05 NOTE — ED Notes (Signed)
ED TO INPATIENT HANDOFF REPORT  ED Nurse Name and Phone #: Marcy Siren [phone]  S Name/Age/Gender Shane Chambers 59 y.o. male Room/Bed: APA06/APA06  Code Status   Code Status: Full Code  Home/SNF/Other Home Patient oriented to: situation Is this baseline? Yes   Triage Complete: Triage complete  Chief Complaint Acute on chronic diastolic heart failure ([place]) [I50.33]  Triage Note Pt brought to ED with complaints of SOB x 2 weeks, progressively gotten worse last 2 days. Pt was placed on 3L per EMS for sat 92% came up to 97%. Pt denies fevers at home.     Allergies Allergies  Allergen Reactions  . Ace Inhibitors Swelling and Cough  . Other Itching    [name]  . Penicillins     Has patient had a PCN reaction causing immediate rash, facial/tongue/throat swelling, SOB or lightheadedness with hypotension: Unknown Has patient had a PCN reaction causing severe rash involving mucus membranes or skin necrosis: Unknown Has patient had a PCN reaction that required hospitalization: Unknown Has patient had a PCN reaction occurring within the last 10 years: Unknown If all of the above answers are "NO", then may proceed with Cephalosporin use.     Level of Care/Admitting Diagnosis ED Disposition    ED Disposition Condition [facility]: [facility] [id number]  Level of Care: Telemetry [5]  Covid Evaluation: Confirmed COVID Negative  Diagnosis: Acute on chronic diastolic heart failure ([place]) [428.33.ICD-9-CM]  Admitting Physician: Rhetta Mura [id number]  Attending Physician: Rhetta Mura [id number]  Estimated length of stay: past midnight tomorrow  Certification:: I certify this patient will need inpatient services for at least 2 midnights       B Medical/Surgery History Past Medical History:  Diagnosis Date  . Depression   . Diabetes mellitus without complication ([place])   . Hyperlipidemia 12/01/2019  . Hypertension   . Type 2  diabetes mellitus ([place])    Past Surgical History:  Procedure Laterality Date  . DENTAL SURGERY       A IV Location/Drains/Wounds Patient Lines/Drains/Airways Status   Active Line/Drains/Airways    Name:   Placement date:   Placement time:   Site:   Days:   Peripheral IV 12/20/19 Right Antecubital   12/20/19    1208    Antecubital   76   Peripheral IV 03/05/20 Left Antecubital   03/05/20    1330    Antecubital   less than 1          Intake/Output Last 24 hours  Intake/Output Summary (Last 24 hours) at 03/05/2020 2348 Last data filed at 03/05/2020 2007 Gross per 24 hour  Intake --  Output 2150 ml  Net -2150 ml    Labs/Imaging Results for orders placed or performed during the hospital encounter of 03/05/20 (from the past 48 hour(s))  Urinalysis, Routine w reflex microscopic     Status: Abnormal   Collection Time: 03/05/20 12:51 PM  Result Value Ref Range   Color, Urine YELLOW YELLOW   APPearance CLEAR CLEAR   Specific Gravity, Urine 1.014 1.005 - 1.030   pH 7.0 5.0 - 8.0   Glucose, UA >=500 (A) NEGATIVE mg/dL   Hgb urine dipstick NEGATIVE NEGATIVE   Bilirubin Urine NEGATIVE NEGATIVE   Ketones, ur NEGATIVE NEGATIVE mg/dL   Protein, ur 100 (A) NEGATIVE mg/dL   Nitrite NEGATIVE NEGATIVE   Leukocytes,Ua NEGATIVE NEGATIVE   RBC / HPF 0-5 0 - 5 RBC/hpf   WBC, UA  0-5 0 - 5 WBC/hpf   Bacteria, UA NONE SEEN NONE SEEN    Comment: Performed at [facility], [address], [place], [place] [address]  Respiratory Panel by RT PCR (Flu A&B, Covid) - Nasopharyngeal Swab     Status: None   Collection Time: 03/05/20  1:29 PM   Specimen: Nasopharyngeal Swab  Result Value Ref Range   SARS Coronavirus 2 by RT PCR NEGATIVE NEGATIVE    Comment: (NOTE) SARS-CoV-2 target nucleic acids are NOT DETECTED. The SARS-CoV-2 RNA is generally detectable in upper respiratoy specimens during the acute phase of infection. The lowest concentration of SARS-CoV-2 viral copies this assay can detect  is 131 copies/mL. A negative result does not preclude SARS-Cov-2 infection and should not be used as the sole basis for treatment or other patient management decisions. A negative result may occur with  improper specimen collection/handling, submission of specimen other than nasopharyngeal swab, presence of viral mutation(s) within the areas targeted by this assay, and inadequate number of viral copies (<131 copies/mL). A negative result must be combined with clinical observations, patient history, and epidemiological information. The expected result is Negative. Fact Sheet for Patients:  PinkCheek.be Fact Sheet for Healthcare Providers:  GravelBags.it This test is not yet ap proved or cleared by the [place] FDA and  has been authorized for detection and/or diagnosis of SARS-CoV-2 by FDA under an Emergency Use Authorization (EUA). This EUA will remain  in effect (meaning this test can be used) for the duration of the COVID-19 declaration under Section 564(b)(1) of the Act, 21 U.S.C. section 360bbb-3(b)(1), unless the authorization is terminated or revoked sooner.    Influenza A by PCR NEGATIVE NEGATIVE   Influenza B by PCR NEGATIVE NEGATIVE    Comment: (NOTE) The Xpert Xpress SARS-CoV-2/FLU/RSV assay is intended as an aid in  the diagnosis of influenza from Nasopharyngeal swab specimens and  should not be used as a sole basis for treatment. Nasal washings and  aspirates are unacceptable for Xpert Xpress SARS-CoV-2/FLU/RSV  testing. Fact Sheet for Patients: PinkCheek.be Fact Sheet for Healthcare Providers: GravelBags.it This test is not yet approved or cleared by the [place] FDA and  has been authorized for detection and/or diagnosis of SARS-CoV-2 by  FDA under an Emergency Use Authorization (EUA). This EUA will remain  in effect (meaning this test can be  used) for the duration of the  Covid-19 declaration under Section 564(b)(1) of the Act, 21  U.S.C. section 360bbb-3(b)(1), unless the authorization is  terminated or revoked. Performed at [facility], [address], [place],  [address]   Comprehensive metabolic panel     Status: Abnormal   Collection Time: 03/05/20  1:30 PM  Result Value Ref Range   Sodium 131 (L) 135 - 145 mmol/L   Potassium 4.5 3.5 - 5.1 mmol/L   Chloride 94 (L) 98 - 111 mmol/L   CO2 26 22 - 32 mmol/L   Glucose, Bld 311 (H) 70 - 99 mg/dL    Comment: Glucose reference range applies only to samples taken after fasting for at least 8 hours.   BUN 22 (H) 6 - 20 mg/dL   Creatinine, Ser 0.88 0.61 - 1.24 mg/dL   Calcium 9.2 8.9 - 10.3 mg/dL   Total Protein 7.4 6.5 - 8.1 g/dL   Albumin 3.7 3.5 - 5.0 g/dL   AST 36 15 - 41 U/L   ALT 21 0 - 44 U/L   Alkaline Phosphatase 113 38 - 126 U/L  Total Bilirubin 1.1 0.3 - 1.2 mg/dL   GFR calc non Af Amer >60 >60 mL/min   GFR calc Af Amer >60 >60 mL/min   Anion gap 11 5 - 15    Comment: Performed at [facility], [address], [place], [place] [address]  CBC with Differential/Platelet     Status: Abnormal   Collection Time: 03/05/20  1:30 PM  Result Value Ref Range   WBC 12.6 (H) 4.0 - 10.5 K/uL   RBC 3.89 (L) 4.22 - 5.81 MIL/uL   Hemoglobin 11.1 (L) 13.0 - 17.0 g/dL   HCT 35.1 (L) 39.0 - 52.0 %   MCV 90.2 80.0 - 100.0 fL   MCH 28.5 26.0 - 34.0 pg   MCHC 31.6 30.0 - 36.0 g/dL   RDW 13.5 11.5 - 15.5 %   Platelets 280 150 - 400 K/uL   nRBC 0.0 0.0 - 0.2 %   Neutrophils Relative % 73 %   Neutro Abs 9.2 (H) 1.7 - 7.7 K/uL   Lymphocytes Relative 15 %   Lymphs Abs 1.9 0.7 - 4.0 K/uL   Monocytes Relative 7 %   Monocytes Absolute 0.9 0.1 - 1.0 K/uL   Eosinophils Relative 3 %   Eosinophils Absolute 0.4 0.0 - 0.5 K/uL   Basophils Relative 1 %   Basophils Absolute 0.1 0.0 - 0.1 K/uL   Immature Granulocytes 1 %   Abs Immature Granulocytes 0.18 (H) 0.00 - 0.07 K/uL     Comment: Performed at [facility], [address] [place], [place] [address]  Brain natriuretic peptide     Status: Abnormal   Collection Time: 03/05/20  1:30 PM  Result Value Ref Range   B Natriuretic Peptide 463.0 (H) 0.0 - 100.0 pg/mL    Comment: Performed at [facility], [address] [place], [place] [address]  D-dimer, quantitative (not at [facility])     Status: Abnormal   Collection Time: 03/05/20  1:30 PM  Result Value Ref Range   D-Dimer, Quant 1.08 (H) 0.00 - 0.50 ug/mL-FEU    Comment: (NOTE) At the manufacturer cut-off of 0.50 ug/mL FEU, this assay has been documented to exclude PE with a sensitivity and negative predictive value of 97 to 99%.  At this time, this assay has not been approved by the FDA to exclude DVT/VTE. Results should be correlated with clinical presentation. Performed at [facility], [address] [place], [place] [address]   Troponin I (High Sensitivity)     Status: Abnormal   Collection Time: 03/05/20  1:30 PM  Result Value Ref Range   Troponin I (High Sensitivity) 81 (H) <18 ng/L    Comment: (NOTE) Elevated high sensitivity troponin I (hsTnI) values and significant  changes across serial measurements may suggest ACS but many other  chronic and acute conditions are known to elevate hsTnI results.  Refer to the "Links" section for chest pain algorithms and additional  guidance. Performed at [facility], [address] [place],  [address]   Troponin I (High Sensitivity)     Status: Abnormal   Collection Time: 03/05/20  3:40 PM  Result Value Ref Range   Troponin I (High Sensitivity) 86 (H) <18 ng/L    Comment: (NOTE) Elevated high sensitivity troponin I (hsTnI) values and significant  changes across serial measurements may suggest ACS but many other  chronic and acute conditions are known to elevate hsTnI results.  Refer to the "Links" section for chest pain algorithms and additional  guidance. Performed at [facility], [address] [place],  [address]   Magnesium     Status: None   Collection Time: 03/05/20  3:40 PM  Result Value Ref Range   Magnesium 1.8 1.7 - 2.4 mg/dL    Comment: Performed at [facility], [address], [place],  [address]   CT Angio Chest PE W/Cm &/Or Wo Cm  Result Date: 03/05/2020 CLINICAL DATA:  Shortness of breath. EXAM: CT ANGIOGRAPHY CHEST WITH CONTRAST TECHNIQUE: Multidetector CT imaging of the chest was performed using the standard protocol during bolus administration of intravenous contrast. Multiplanar CT image reconstructions and MIPs were obtained to evaluate the vascular anatomy. CONTRAST:  178mL OMNIPAQUE IOHEXOL 350 MG/ML SOLN COMPARISON:  December 20, 2019. FINDINGS: Cardiovascular: Satisfactory opacification of the pulmonary arteries to the segmental level. No evidence of pulmonary embolism. Mild cardiomegaly. No pericardial effusion. Coronary artery calcifications are noted. Mediastinum/Nodes: Stable 1.5 cm anterior mediastinal lymph node is noted. Stable mildly enlarged right paratracheal and aortopulmonary window adenopathy is noted. This most likely is reactive in etiology. Lungs/Pleura: No pneumothorax is noted. Minimal bilateral pleural effusions are noted with adjacent subsegmental atelectasis. Mild right middle lobe subsegmental atelectasis is noted. Upper Abdomen: No acute abnormality. Musculoskeletal: No chest wall abnormality. No acute or significant osseous findings. Review of the MIP images confirms the above findings. IMPRESSION: 1. No definite evidence of pulmonary embolus. 2. Coronary artery calcifications are noted suggesting coronary artery disease. 3. Minimal bilateral pleural effusions are noted with adjacent subsegmental atelectasis. Mild right middle lobe subsegmental atelectasis is noted. 4. Stable mediastinal adenopathy is noted which most likely is reactive in etiology. Electronically Signed   By: Marijo Conception M.D.   On: 03/05/2020 17:39   DG Chest Port 1  View  Result Date: 03/05/2020 CLINICAL DATA:  Shortness of breath. EXAM: PORTABLE CHEST 1 VIEW COMPARISON:  12/24/2019 FINDINGS: The heart is borderline enlarged. The mediastinal and hilar contours are within normal limits. Slightly prominent interstitial markings could suggest bronchitis. No definite infiltrates or effusions. No pulmonary lesions. IMPRESSION: Slightly prominent interstitial markings could suggest bronchitis. No definite infiltrates or effusions. Electronically Signed   By: Marijo Sanes M.D.   On: 03/05/2020 13:27    Pending Labs Unresulted Labs (From admission, onward)    Start     Ordered   03/06/20 0500  Magnesium  Tomorrow morning,   R     03/05/20 2217   03/06/20 [id number]  Basic metabolic panel  Tomorrow morning,   R     03/05/20 2217   03/06/20 0500  Phosphorus  Tomorrow morning,   R     03/05/20 2217   03/06/20 0500  CBC WITH DIFFERENTIAL  Tomorrow morning,   R     03/05/20 2217   03/06/20 0500  TSH  Tomorrow morning,   R     03/05/20 2217   03/06/20 0500  Blood gas, arterial  Tomorrow morning,   R     03/05/20 2224   03/06/20 0500  Protime-INR  Tomorrow morning,   R     03/05/20 2227   03/05/20 2228  Sodium, urine, random  Add-on,   AD     03/05/20 2227   03/05/20 2228  Osmolality, urine  Add-on,   AD     03/05/20 2227          Vitals/Pain Today's Vitals   03/05/20 2200 03/05/20 2215 03/05/20 2230 03/05/20 2245  BP: 136/77  138/84   Pulse: 93 93 94 93  Resp: (!)  22 (!) 25 (!) 23   Temp:      TempSrc:      SpO2: 97% 97% 96% 95%  Weight:      Height:      PainSc:        Isolation Precautions No active isolations  Medications Medications  aspirin EC tablet 81 mg (has no administration in time range)  atorvastatin (LIPITOR) tablet 40 mg (40 mg Oral Given 03/05/20 2345)  metoprolol succinate (TOPROL-XL) 24 hr tablet 200 mg (has no administration in time range)  tamsulosin (FLOMAX) capsule 0.4 mg (has no administration in time range)  ferrous  sulfate tablet 325 mg (has no administration in time range)  gabapentin (NEURONTIN) capsule 900 mg (900 mg Oral Given 03/05/20 2345)  albuterol (PROVENTIL) (2.5 MG/3ML) 0.083% nebulizer solution 2.5 mg (has no administration in time range)  sodium chloride flush (NS) 0.9 % injection 3 mL (has no administration in time range)  enoxaparin (LOVENOX) injection 40 mg (has no administration in time range)  acetaminophen (TYLENOL) tablet 650 mg (has no administration in time range)    Or  acetaminophen (TYLENOL) suppository 650 mg (has no administration in time range)  insulin glargine (LANTUS) injection 10 Units (has no administration in time range)  insulin aspart (novoLOG) injection 0-15 Units (has no administration in time range)  insulin aspart (novoLOG) injection 0-5 Units (has no administration in time range)  furosemide (LASIX) injection 40 mg (has no administration in time range)  albuterol (PROVENTIL) (2.5 MG/3ML) 0.083% nebulizer solution 2.5 mg (2.5 mg Nebulization Given 03/05/20 1450)  methylPREDNISolone sodium succinate (SOLU-MEDROL) 125 mg/2 mL injection 125 mg (125 mg Intravenous Given 03/05/20 1441)  iohexol (OMNIPAQUE) 350 MG/ML injection 100 mL (100 mLs Intravenous Contrast Given 03/05/20 1704)  furosemide (LASIX) injection 60 mg (60 mg Intravenous Given 03/05/20 1759)  aspirin tablet 325 mg (325 mg Oral Given 03/05/20 2345)  furosemide (LASIX) injection 20 mg (20 mg Intravenous Given 03/05/20 2344)    Mobility walks with device Moderate fall risk   Focused Assessments Pulmonary Assessment Handoff:  Lung sounds: Bilateral Breath Sounds: Diminished O2 Device: Nasal Cannula O2 Flow Rate (L/min): 4 L/min      R Recommendations: See Admitting Provider Note  Report given to:   Additional Notes: Pt is on 2lpm

## 2020-03-05 NOTE — ED Provider Notes (Signed)
[facility] EMERGENCY DEPARTMENT Provider Note   CSN: [id number] Arrival date & time: 03/05/20  1124     History Chief Complaint  Patient presents with  . Shortness of Breath    Shane Chambers is a 59 y.o. male.  Patient brought in by EMS.  Patient with complaint of shortness of breath for 2 weeks.  Progressively gotten worse over the last 2 days.  EMS placed him on 3 L of nasal cannula oxygen for an oxygen saturation 92%.  Came up to 97%.  Patient denies any fevers at home.  Patient states he has had some chest discomfort with deep breaths and with coughing.  But no constant chest discomfort.  Patient had COVID-19 infection in January.  Denies any fevers.  But he has had some fatigue and some body aches with this.  Patient's had a history of bronchitis in the past.  Patient does not use oxygen at home.  No documented oxygen saturation below 90%.        Past Medical History:  Diagnosis Date  . Depression   . Diabetes mellitus without complication ([place])   . Hyperlipidemia 12/01/2019  . Hypertension   . Type 2 diabetes mellitus [facility])     Patient Active Problem List   Diagnosis Date Noted  . Acute respiratory failure with hypoxia ([place]) 12/21/2019  . Acute respiratory failure ([place]) 12/20/2019  . Severe Vitamin D deficiency 12/02/2019  . CAP (community acquired pneumonia) 12/01/2019  . Hypokalemia 12/01/2019  . Hyperlipidemia 12/01/2019  . BPH (benign prostatic hyperplasia) 12/01/2019  . Normocytic anemia 12/01/2019  . Pneumonia due to COVID-19 virus 12/01/2019  . Hypoxia   . SOB (shortness of breath) 11/16/2019  . Sepsis ([place]) 11/16/2019  . Hypertension   . Type 2 diabetes mellitus ([place])   . Depression   . Acute bronchitis with bronchospasm 11/15/2019    Past Surgical History:  Procedure Laterality Date  . DENTAL SURGERY         Family History  Problem Relation Age of Onset  . Hypertension Mother     Social History   Tobacco Use  . Smoking status: Former  Research scientist (life sciences)  . Smokeless tobacco: Never Used  Substance Use Topics  . Alcohol use: Yes    Comment: rarely  . Drug use: No    Home Medications Prior to Admission medications   Medication Sig Start Date End Date Taking? Authorizing Provider  albuterol (VENTOLIN HFA) 108 (90 Base) MCG/ACT inhaler Inhale 1-2 puffs into the lungs every 6 (six) hours as needed for wheezing or shortness of breath.  07/27/19   [provider]  amLODipine (NORVASC) 10 MG tablet Take 10 mg by mouth daily.    [provider]  aspirin 81 MG EC tablet Take 81 mg by mouth daily.  03/10/16   [provider]  atorvastatin (LIPITOR) 40 MG tablet Take 40 mg by mouth daily. 10/20/19   [provider]  docusate sodium (COLACE) 100 MG capsule Take 1 capsule (100 mg total) by mouth 2 (two) times daily. 12/25/19   Manuella Ghazi, Pratik D, DO  fluticasone (VERAMYST) 27.5 MCG/SPRAY nasal spray Place 2 sprays into the nose daily as needed for rhinitis or allergies.     [provider]  furosemide (LASIX) 20 MG tablet Take 1 tablet (20 mg total) by mouth daily. Take an extra 20mg  by mouth if noted to have greater than 3 lb weight gain in 24 hours. 12/25/19 12/24/20  Manuella Ghazi, Pratik D, DO  gabapentin (NEURONTIN)  300 MG capsule Take 900 mg by mouth 2 (two) times daily.     [provider]  indomethacin (INDOCIN) 25 MG capsule Take 25 mg by mouth 2 (two) times daily as needed for mild pain or moderate pain.  11/24/19   [provider]  insulin aspart protamine- aspart (NOVOLOG MIX 70/30) (70-30) 100 UNIT/ML injection Inject 0.55 mLs (55 Units total) into the skin 2 (two) times daily with a meal. 12/25/19   Manuella Ghazi, Pratik D, DO  Iron, Ferrous Sulfate, 325 (65 Fe) MG TABS Take 1 tablet by mouth daily. 12/06/19   Johnson, Clanford L, MD  metFORMIN (GLUCOPHAGE) 1000 MG tablet Take 1,000 mg by mouth 2 (two) times daily with a meal.    [provider]  metoprolol (TOPROL-XL) 200 MG 24 hr tablet Take  200 mg by mouth daily.    [provider]  Multiple Vitamins-Minerals (TAB-A-VITE) TABS Take 1 tablet by mouth daily. 07/27/19   [provider]  omeprazole (PRILOSEC) 40 MG capsule Take 40 mg by mouth daily.    [provider]  polyethylene glycol (MIRALAX / GLYCOLAX) 17 g packet Take 17 g by mouth daily as needed for mild constipation. 12/25/19   Manuella Ghazi, Pratik D, DO  potassium chloride (KLOR-CON) 10 MEQ tablet Take 1 tablet (10 mEq total) by mouth daily. 12/25/19 01/24/20  Manuella Ghazi, Pratik D, DO  tamsulosin (FLOMAX) 0.4 MG CAPS capsule Take 0.4 mg by mouth daily.    [provider]  venlafaxine XR (EFFEXOR-XR) 150 MG 24 hr capsule Take 150 mg by mouth daily with breakfast.    [provider]    Allergies    Ace inhibitors, Other, and Penicillins  Review of Systems   Review of Systems  Constitutional: Positive for fatigue. Negative for chills and fever.  HENT: Negative for congestion, rhinorrhea and sore throat.   Eyes: Negative for visual disturbance.  Respiratory: Positive for cough and shortness of breath. Negative for wheezing.   Cardiovascular: Positive for chest pain. Negative for leg swelling.  Gastrointestinal: Negative for abdominal pain, diarrhea, nausea and vomiting.  Genitourinary: Negative for dysuria.  Musculoskeletal: Positive for myalgias. Negative for back pain and neck pain.  Skin: Negative for rash.  Neurological: Negative for dizziness, light-headedness and headaches.  Hematological: Does not bruise/bleed easily.  Psychiatric/Behavioral: Negative for confusion.    Physical Exam Updated Vital Signs BP (!) 142/80 (BP Location: Left Arm)   Pulse 81   Temp 98.8 F (37.1 C) (Oral)   Resp 20   Ht 1.753 m (5\' 9" )   Wt 134.3 kg   SpO2 99%   BMI 43.71 kg/m   Physical Exam Vitals and nursing note reviewed.  Constitutional:      Appearance: Normal appearance. He is well-developed.  HENT:     Head: Normocephalic and  atraumatic.  Eyes:     Extraocular Movements: Extraocular movements intact.     Conjunctiva/sclera: Conjunctivae normal.     Pupils: Pupils are equal, round, and reactive to light.  Cardiovascular:     Rate and Rhythm: Normal rate and regular rhythm.     Heart sounds: No murmur.  Pulmonary:     Effort: Pulmonary effort is normal. No respiratory distress.     Breath sounds: Normal breath sounds. No wheezing.  Abdominal:     Palpations: Abdomen is soft.     Tenderness: There is no abdominal tenderness.  Musculoskeletal:        General: Normal range of motion.  Cervical back: Normal range of motion and neck supple.  Skin:    General: Skin is warm and dry.  Neurological:     General: No focal deficit present.     Mental Status: He is alert and oriented to person, place, and time.     Cranial Nerves: No cranial nerve deficit.     Sensory: No sensory deficit.     Motor: No weakness.     ED Results / Procedures / Treatments   Labs (all labs ordered are listed, but only abnormal results are displayed) Labs Reviewed - No data to display  EKG EKG Interpretation  Date/Time:  Monday March 05 2020 11:35:30 EDT Ventricular Rate:  80 PR Interval:  184 QRS Duration: 116 QT Interval:  386 QTC Calculation: 445 R Axis:   60 Text Interpretation: Normal sinus rhythm Cannot rule out Inferior infarct , age undetermined Cannot rule out Anterior infarct , age undetermined ST & T wave abnormality, consider lateral ischemia Abnormal ECG Confirmed by Fredia Sorrow [phone]) on 03/05/2020 11:47:31 AM   Radiology No results found.  Results for orders placed or performed during the hospital encounter of 03/05/20  Respiratory Panel by RT PCR (Flu A&B, Covid) - Nasopharyngeal Swab   Specimen: Nasopharyngeal Swab  Result Value Ref Range   SARS Coronavirus 2 by RT PCR NEGATIVE NEGATIVE   Influenza A by PCR NEGATIVE NEGATIVE   Influenza B by PCR NEGATIVE NEGATIVE  Comprehensive metabolic panel    Result Value Ref Range   Sodium 131 (L) 135 - 145 mmol/L   Potassium 4.5 3.5 - 5.1 mmol/L   Chloride 94 (L) 98 - 111 mmol/L   CO2 26 22 - 32 mmol/L   Glucose, Bld 311 (H) 70 - 99 mg/dL   BUN 22 (H) 6 - 20 mg/dL   Creatinine, Ser 0.88 0.61 - 1.24 mg/dL   Calcium 9.2 8.9 - 10.3 mg/dL   Total Protein 7.4 6.5 - 8.1 g/dL   Albumin 3.7 3.5 - 5.0 g/dL   AST 36 15 - 41 U/L   ALT 21 0 - 44 U/L   Alkaline Phosphatase 113 38 - 126 U/L   Total Bilirubin 1.1 0.3 - 1.2 mg/dL   GFR calc non Af Amer >60 >60 mL/min   GFR calc Af Amer >60 >60 mL/min   Anion gap 11 5 - 15  CBC with Differential/Platelet  Result Value Ref Range   WBC 12.6 (H) 4.0 - 10.5 K/uL   RBC 3.89 (L) 4.22 - 5.81 MIL/uL   Hemoglobin 11.1 (L) 13.0 - 17.0 g/dL   HCT 35.1 (L) 39.0 - 52.0 %   MCV 90.2 80.0 - 100.0 fL   MCH 28.5 26.0 - 34.0 pg   MCHC 31.6 30.0 - 36.0 g/dL   RDW 13.5 11.5 - 15.5 %   Platelets 280 150 - 400 K/uL   nRBC 0.0 0.0 - 0.2 %   Neutrophils Relative % 73 %   Neutro Abs 9.2 (H) 1.7 - 7.7 K/uL   Lymphocytes Relative 15 %   Lymphs Abs 1.9 0.7 - 4.0 K/uL   Monocytes Relative 7 %   Monocytes Absolute 0.9 0.1 - 1.0 K/uL   Eosinophils Relative 3 %   Eosinophils Absolute 0.4 0.0 - 0.5 K/uL   Basophils Relative 1 %   Basophils Absolute 0.1 0.0 - 0.1 K/uL   Immature Granulocytes 1 %   Abs Immature Granulocytes 0.18 (H) 0.00 - 0.07 K/uL  Brain natriuretic peptide  Result Value Ref  Range   B Natriuretic Peptide 463.0 (H) 0.0 - 100.0 pg/mL  Urinalysis, Routine w reflex microscopic  Result Value Ref Range   Color, Urine YELLOW YELLOW   APPearance CLEAR CLEAR   Specific Gravity, Urine 1.014 1.005 - 1.030   pH 7.0 5.0 - 8.0   Glucose, UA >=500 (A) NEGATIVE mg/dL   Hgb urine dipstick NEGATIVE NEGATIVE   Bilirubin Urine NEGATIVE NEGATIVE   Ketones, ur NEGATIVE NEGATIVE mg/dL   Protein, ur 100 (A) NEGATIVE mg/dL   Nitrite NEGATIVE NEGATIVE   Leukocytes,Ua NEGATIVE NEGATIVE   RBC / HPF 0-5 0 - 5 RBC/hpf    WBC, UA 0-5 0 - 5 WBC/hpf   Bacteria, UA NONE SEEN NONE SEEN  D-dimer, quantitative (not at [facility])  Result Value Ref Range   D-Dimer, Quant 1.08 (H) 0.00 - 0.50 ug/mL-FEU  Troponin I (High Sensitivity)  Result Value Ref Range   Troponin I (High Sensitivity) 81 (H) <18 ng/L   DG Chest Port 1 View  Result Date: 03/05/2020 CLINICAL DATA:  Shortness of breath. EXAM: PORTABLE CHEST 1 VIEW COMPARISON:  12/24/2019 FINDINGS: The heart is borderline enlarged. The mediastinal and hilar contours are within normal limits. Slightly prominent interstitial markings could suggest bronchitis. No definite infiltrates or effusions. No pulmonary lesions. IMPRESSION: Slightly prominent interstitial markings could suggest bronchitis. No definite infiltrates or effusions. Electronically Signed   By: Marijo Sanes M.D.   On: 03/05/2020 13:27     Procedures Procedures (including critical care time)  Medications Ordered in ED Medications - No data to display  ED Course  I have reviewed the triage vital signs and the nursing notes.  Pertinent labs & imaging results that were available during my care of the patient were reviewed by me and considered in my medical decision making (see chart for details).    MDM Rules/Calculators/A&P                     Patient coming in with a complaint of shortness of breath over the past 2 weeks worse the past 2 days.  Covid testing here negative flu testing negative.  Patient's D-dimer was elevated and patient's troponin was elevated.  D-dimer at 1.08 and troponin at 80.  Will need a delta troponin.  Patient without any real acute cardiac chest pain.  EKG without any STEMI changes.  Chest x-ray suggestive of bronchitis.  Based on the elevated troponin patient is going to get CT angio chest.  Renal function is good.  And patient will need delta troponin.  Patient will also need to be rechecked off of oxygen.  In the meantime he has received nebulizer treatment and Solu-Medrol 125  mg has helped him in the past.  Though no wheezing heard here.  Patient turned over to evening emergency physician Dr. Laverta Baltimore.  If patient has an oxygen requirement he needs admission if he has a pulmonary embolus he needs admission if he has worsening troponins he may require admission to [facility].  If troponins the same or lower could discuss with cardiology to see if it would be fine to keep him here.     Final Clinical Impression(s) / ED Diagnoses Final diagnoses:  None    Rx / DC Orders ED Discharge Orders    None       Fredia Sorrow, MD 03/05/20 1546

## 2020-03-06 ENCOUNTER — Inpatient Hospital Stay (HOSPITAL_COMMUNITY): Payer: Medicaid Other

## 2020-03-06 DIAGNOSIS — I214 Non-ST elevation (NSTEMI) myocardial infarction: Secondary | ICD-10-CM

## 2020-03-06 DIAGNOSIS — I5033 Acute on chronic diastolic (congestive) heart failure: Secondary | ICD-10-CM

## 2020-03-06 DIAGNOSIS — I340 Nonrheumatic mitral (valve) insufficiency: Secondary | ICD-10-CM

## 2020-03-06 LAB — BASIC METABOLIC PANEL
Anion gap: 15 (ref 5–15)
BUN: 24 mg/dL — ABNORMAL HIGH (ref 6–20)
CO2: 25 mmol/L (ref 22–32)
Calcium: 9.7 mg/dL (ref 8.9–10.3)
Chloride: 93 mmol/L — ABNORMAL LOW (ref 98–111)
Creatinine, Ser: 0.88 mg/dL (ref 0.61–1.24)
GFR calc Af Amer: >60 mL/min (ref >60)
GFR calc non Af Amer: >60 mL/min (ref >60)
Glucose, Bld: 424 mg/dL — ABNORMAL HIGH (ref 70–99)
Potassium: 4 mmol/L (ref 3.5–5.1)
Sodium: 133 mmol/L — ABNORMAL LOW (ref 135–145)

## 2020-03-06 LAB — PROTIME-INR
INR: 1.1 (ref 0.8–1.2)
INR: 1.1 (ref 0.8–1.2)
Prothrombin Time: 14.3 seconds (ref 11.4–15.2)
Prothrombin Time: 14.1 seconds (ref 11.4–15.2)

## 2020-03-06 LAB — CBC WITH DIFFERENTIAL/PLATELET
Abs Immature Granulocytes: 0.15 10*3/uL — ABNORMAL HIGH (ref 0.00–0.07)
Basophils Absolute: 0 10*3/uL (ref 0.0–0.1)
Basophils Relative: 0 %
Eosinophils Absolute: 0 10*3/uL (ref 0.0–0.5)
Eosinophils Relative: 0 %
HCT: 38.1 % — ABNORMAL LOW (ref 39.0–52.0)
Hemoglobin: 12.3 g/dL — ABNORMAL LOW (ref 13.0–17.0)
Immature Granulocytes: 1 %
Lymphocytes Relative: 6 %
Lymphs Abs: 0.7 10*3/uL (ref 0.7–4.0)
MCH: 28.7 pg (ref 26.0–34.0)
MCHC: 32.3 g/dL (ref 30.0–36.0)
MCV: 89 fL (ref 80.0–100.0)
Monocytes Absolute: 0.3 10*3/uL (ref 0.1–1.0)
Monocytes Relative: 3 %
Neutro Abs: 9.5 10*3/uL — ABNORMAL HIGH (ref 1.7–7.7)
Neutrophils Relative %: 90 %
Platelets: 299 10*3/uL (ref 150–400)
RBC: 4.28 MIL/uL (ref 4.22–5.81)
RDW: 13.5 % (ref 11.5–15.5)
WBC: 10.7 10*3/uL — ABNORMAL HIGH (ref 4.0–10.5)
nRBC: 0 % (ref 0.0–0.2)

## 2020-03-06 LAB — TROPONIN I (HIGH SENSITIVITY)
Troponin I (High Sensitivity): 690 ng/L (ref <18)
Troponin I (High Sensitivity): 1933 ng/L (ref <18)
Troponin I (High Sensitivity): 3835 ng/L (ref <18)
Troponin I (High Sensitivity): 4590 ng/L (ref <18)
Troponin I (High Sensitivity): 4752 ng/L (ref <18)
Troponin I (High Sensitivity): 4111 ng/L (ref <18)
Troponin I (High Sensitivity): 3519 ng/L (ref <18)

## 2020-03-06 LAB — BLOOD GAS, ARTERIAL
Acid-Base Excess: 3.4 mmol/L — ABNORMAL HIGH (ref 0.0–2.0)
Bicarbonate: 27.6 mmol/L (ref 20.0–28.0)
Drawn by: 41977
FIO2: 28
O2 Saturation: 95.4 %
Patient temperature: 37
pCO2 arterial: 37.2 mmHg (ref 32.0–48.0)
pH, Arterial: 7.472 — ABNORMAL HIGH (ref 7.350–7.450)
pO2, Arterial: 77 mmHg — ABNORMAL LOW (ref 83.0–108.0)

## 2020-03-06 LAB — MRSA PCR SCREENING: MRSA by PCR: NEGATIVE

## 2020-03-06 LAB — HEPARIN LEVEL (UNFRACTIONATED)
Heparin Unfractionated: 0.16 IU/mL — ABNORMAL LOW (ref 0.30–0.70)
Heparin Unfractionated: 0.41 IU/mL (ref 0.30–0.70)

## 2020-03-06 LAB — PHOSPHORUS: Phosphorus: 5.6 mg/dL — ABNORMAL HIGH (ref 2.5–4.6)

## 2020-03-06 LAB — MAGNESIUM: Magnesium: 1.7 mg/dL (ref 1.7–2.4)

## 2020-03-06 LAB — HEMOGLOBIN A1C
Hgb A1c MFr Bld: 10.2 % — ABNORMAL HIGH (ref 4.8–5.6)
Mean Plasma Glucose: 246.04 mg/dL

## 2020-03-06 LAB — GLUCOSE, CAPILLARY
Glucose-Capillary: 441 mg/dL — ABNORMAL HIGH (ref 70–99)
Glucose-Capillary: 432 mg/dL — ABNORMAL HIGH (ref 70–99)
Glucose-Capillary: 388 mg/dL — ABNORMAL HIGH (ref 70–99)
Glucose-Capillary: 275 mg/dL — ABNORMAL HIGH (ref 70–99)
Glucose-Capillary: 310 mg/dL — ABNORMAL HIGH (ref 70–99)
Glucose-Capillary: 319 mg/dL — ABNORMAL HIGH (ref 70–99)

## 2020-03-06 LAB — ECHOCARDIOGRAM LIMITED
Height: 69 in
Weight: 4701.97 oz

## 2020-03-06 LAB — APTT: aPTT: 30 seconds (ref 24–36)

## 2020-03-06 LAB — TSH: TSH: 3.35 u[IU]/mL (ref 0.350–4.500)

## 2020-03-06 MED ORDER — FUROSEMIDE 10 MG/ML IJ SOLN
20.0000 mg | Freq: Once | INTRAMUSCULAR | Status: AC
  Administered 2020-03-06: 20 mg via INTRAVENOUS
  Filled 2020-03-06: qty 2

## 2020-03-06 MED ORDER — NITROGLYCERIN IN D5W 200-5 MCG/ML-% IV SOLN
0.0000 ug/min | INTRAVENOUS | Status: DC
  Administered 2020-03-06: 5 ug/min via INTRAVENOUS
  Administered 2020-03-09 – 2020-03-12 (×3): 20 ug/min via INTRAVENOUS
  Filled 2020-03-06 (×4): qty 250

## 2020-03-06 MED ORDER — NITROGLYCERIN 0.4 MG SL SUBL: 0.4000 mg | SUBLINGUAL_TABLET | SUBLINGUAL | Status: DC | PRN

## 2020-03-06 MED ORDER — NITROGLYCERIN 2 % TD OINT: 0.5000 [in_us] | TOPICAL_OINTMENT | Freq: Four times a day (QID) | TRANSDERMAL | Status: DC

## 2020-03-06 MED ORDER — HEPARIN (PORCINE) 25000 UT/250ML-% IV SOLN
1700.0000 [IU]/h | INTRAVENOUS | Status: DC
  Administered 2020-03-06: 1700 [IU]/h via INTRAVENOUS
  Administered 2020-03-06: 1400 [IU]/h via INTRAVENOUS
  Administered 2020-03-07: 1700 [IU]/h via INTRAVENOUS
  Filled 2020-03-06 (×3): qty 250

## 2020-03-06 MED ORDER — HEPARIN BOLUS VIA INFUSION
3000.0000 [IU] | Freq: Once | INTRAVENOUS | Status: AC
  Administered 2020-03-06: 3000 [IU] via INTRAVENOUS
  Filled 2020-03-06: qty 3000

## 2020-03-06 MED ORDER — MORPHINE SULFATE (PF) 2 MG/ML IV SOLN
1.0000 mg | INTRAVENOUS | Status: DC | PRN
  Administered 2020-03-06 (×2): 1 mg via INTRAVENOUS
  Filled 2020-03-06 (×2): qty 1

## 2020-03-06 MED ORDER — CHLORHEXIDINE GLUCONATE CLOTH 2 % EX PADS
6.0000 | MEDICATED_PAD | Freq: Every day | CUTANEOUS | Status: DC
  Administered 2020-03-06 – 2020-03-07 (×2): 6 via TOPICAL

## 2020-03-06 MED ORDER — FUROSEMIDE 10 MG/ML IJ SOLN
60.0000 mg | Freq: Two times a day (BID) | INTRAMUSCULAR | Status: DC
  Administered 2020-03-06 – 2020-03-09 (×5): 60 mg via INTRAVENOUS
  Filled 2020-03-06 (×5): qty 6

## 2020-03-06 MED ORDER — INSULIN ASPART 100 UNIT/ML ~~LOC~~ SOLN
0.0000 [IU] | SUBCUTANEOUS | Status: DC
  Administered 2020-03-06: 11 [IU] via SUBCUTANEOUS
  Administered 2020-03-06: 20 [IU] via SUBCUTANEOUS
  Administered 2020-03-06: 15 [IU] via SUBCUTANEOUS
  Administered 2020-03-06: 20 [IU] via SUBCUTANEOUS
  Administered 2020-03-06: 15 [IU] via SUBCUTANEOUS
  Administered 2020-03-07: 7 [IU] via SUBCUTANEOUS
  Administered 2020-03-07: 4 [IU] via SUBCUTANEOUS
  Administered 2020-03-07: 11 [IU] via SUBCUTANEOUS
  Administered 2020-03-07: 7 [IU] via SUBCUTANEOUS
  Administered 2020-03-08: 4 [IU] via SUBCUTANEOUS
  Administered 2020-03-08: 7 [IU] via SUBCUTANEOUS
  Administered 2020-03-08: 11 [IU] via SUBCUTANEOUS
  Administered 2020-03-08: 15 [IU] via SUBCUTANEOUS
  Administered 2020-03-08: 7 [IU] via SUBCUTANEOUS
  Administered 2020-03-08: 11 [IU] via SUBCUTANEOUS
  Administered 2020-03-09: 15 [IU] via SUBCUTANEOUS
  Administered 2020-03-09: 3 [IU] via SUBCUTANEOUS
  Administered 2020-03-09 (×2): 11 [IU] via SUBCUTANEOUS
  Administered 2020-03-09: 3 [IU] via SUBCUTANEOUS
  Administered 2020-03-09: 7 [IU] via SUBCUTANEOUS
  Administered 2020-03-10 (×3): 15 [IU] via SUBCUTANEOUS
  Administered 2020-03-10: 4 [IU] via SUBCUTANEOUS
  Administered 2020-03-10: 11 [IU] via SUBCUTANEOUS
  Administered 2020-03-10: 4 [IU] via SUBCUTANEOUS
  Administered 2020-03-11: 11 [IU] via SUBCUTANEOUS
  Administered 2020-03-11: 4 [IU] via SUBCUTANEOUS
  Administered 2020-03-11: 7 [IU] via SUBCUTANEOUS
  Administered 2020-03-11: 4 [IU] via SUBCUTANEOUS
  Administered 2020-03-11: 11 [IU] via SUBCUTANEOUS
  Administered 2020-03-11 – 2020-03-12 (×3): 4 [IU] via SUBCUTANEOUS
  Administered 2020-03-12: 7 [IU] via SUBCUTANEOUS

## 2020-03-06 MED ORDER — HEPARIN BOLUS VIA INFUSION
4000.0000 [IU] | Freq: Once | INTRAVENOUS | Status: AC
  Administered 2020-03-06: 4000 [IU] via INTRAVENOUS
  Filled 2020-03-06: qty 4000

## 2020-03-06 NOTE — Consult Note (Signed)
Cardiology Consultation:   Patient ID: Shane Chambers MRN: [id number]; DOB: 11-03-1961  Admit date: 03/05/2020 Date of Consult: 03/06/2020  Primary Care Provider: [name], MD Primary Cardiologist: Reola Calkins, Dr Harl Bowie MD Primary Electrophysiologist:  None    Patient Profile:   Shane Chambers is a 59 y.o. male with a hx of DM2, HTN who is being seen today for the evaluation ofSOB and chest pain at the request of Dr Manuella Ghazi.  History of Present Illness:   Shane Chambers 59 yo male history of DM2, HTN, BPH, chronically elevated troponin admitted with SOB and chest pain  Chest pain started about 1 week ago. Burning/dull pain midchest though can spread throughout the precordium. Worst with deep breathing and cough, constant lasting over 24 hours. Progressing SOB over the last few months, along with LE edema and orthopnea     K 4.5 Cr 0.88 BUN 22 WBC 12.6 Hgb 11.1 Plt 280 BNP 463 Ddimer 1.08 TSH 3.35 Mg 1.8  COVID neg Trop 81-->86-->690--> 7.47/37/77/28 CXR slightly prominent markings EKG SR, inferior and lateral ST depressions CT PE no PE. +coronary calcifications. Minimal bilateral pleural effusions.  12/2019 echo LVE 50-55%, no WMAs, normal RV function. Indet diastolic function Repeat echo pending   Past Medical History:  Diagnosis Date  . Depression   . Diabetes mellitus without complication ([place])   . Hyperlipidemia 12/01/2019  . Hypertension   . Type 2 diabetes mellitus ([place])     Past Surgical History:  Procedure Laterality Date  . DENTAL SURGERY       Inpatient Medications: Scheduled Meds: . aspirin EC  81 mg Oral Daily  . atorvastatin  40 mg Oral QHS  . Chlorhexidine Gluconate Cloth  6 each Topical Daily  . ferrous sulfate  325 mg Oral Q breakfast  . furosemide  40 mg Intravenous BID  . gabapentin  900 mg Oral BID  . insulin aspart  0-20 Units Subcutaneous Q4H  . insulin glargine  10 Units Subcutaneous BID  . metoprolol  200 mg Oral Daily  . sodium chloride flush   3 mL Intravenous Q12H  . tamsulosin  0.4 mg Oral Daily   Continuous Infusions: . heparin 1,400 Units/hr (03/06/20 0822)   PRN Meds: acetaminophen **OR** acetaminophen, albuterol, morphine injection, nitroGLYCERIN  Allergies:    Allergies  Allergen Reactions  . Ace Inhibitors Swelling and Cough  . Other Itching    [name]  . Penicillins     Has patient had a PCN reaction causing immediate rash, facial/tongue/throat swelling, SOB or lightheadedness with hypotension: Unknown Has patient had a PCN reaction causing severe rash involving mucus membranes or skin necrosis: Unknown Has patient had a PCN reaction that required hospitalization: Unknown Has patient had a PCN reaction occurring within the last 10 years: Unknown If all of the above answers are "NO", then may proceed with Cephalosporin use.     Social History:   Social History   Socioeconomic History  . Marital status: Single    Spouse name: Not on file  . Number of children: Not on file  . Years of education: Not on file  . Highest education level: Not on file  Occupational History  . Not on file  Tobacco Use  . Smoking status: Former Research scientist (life sciences)  . Smokeless tobacco: Never Used  Substance and Sexual Activity  . Alcohol use: Yes    Comment: rarely  . Drug use: No  . Sexual activity: Not on file  Other Topics Concern  . Not  on file  Social History Narrative  . Not on file   Social Determinants of Health   Financial Resource Strain:   . Difficulty of Paying Living Expenses:   Food Insecurity:   . Worried About Charity fundraiser in the Last Year:   . Arboriculturist in the Last Year:   Transportation Needs:   . Film/video editor (Medical):   Marland Kitchen Lack of Transportation (Non-Medical):   Physical Activity:   . Days of Exercise per Week:   . Minutes of Exercise per Session:   Stress:   . Feeling of Stress :   Social Connections:   . Frequency of Communication with Friends and Family:   . Frequency of  Social Gatherings with Friends and Family:   . Attends Religious Services:   . Active Member of Clubs or Organizations:   . Attends Archivist Meetings:   Marland Kitchen Marital Status:   Intimate Partner Violence:   . Fear of Current or Ex-Partner:   . Emotionally Abused:   Marland Kitchen Physically Abused:   . Sexually Abused:     Family History:    Family History  Problem Relation Age of Onset  . Hypertension Mother      ROS:  Please see the history of present illness.  All other ROS reviewed and negative.     Physical Exam/Data:   Vitals:   03/06/20 0500 03/06/20 0600 03/06/20 0700 03/06/20 0711  BP: (!) 141/89 130/78    Pulse: 97 97 94   Resp: (!) 21 20 20    Temp:      TempSrc:      SpO2: 95% 97% 95%   Weight:    133.3 kg  Height:        Intake/Output Summary (Last 24 hours) at 03/06/2020 0835 Last data filed at 03/06/2020 0231 Gross per 24 hour  Intake --  Output 2775 ml  Net -2775 ml   Last 3 Weights 03/06/2020 03/05/2020 03/05/2020  Weight (lbs) 293 lb 14 oz 296 lb 1.2 oz 296 lb  Weight (kg) 133.3 kg 134.3 kg 134.265 kg     Body mass index is 43.4 kg/m.  General:  Well nourished, well developed, in no acute distress HEENT: normal Lymph: no adenopathy Neck: elevated JVD Endocrine:  No thryomegaly Vascular: No carotid bruits; FA pulses 2+ bilaterally without bruits  Cardiac:  normal S1, S2; RRR; no murmur  Lungs:  Bilateral crackles Abd: soft, nontender, no hepatomegaly  Ext: 1+ bilateral LE edema Musculoskeletal:  No deformities, BUE and BLE strength normal and equal Skin: warm and dry  Neuro:  CNs 2-12 intact, no focal abnormalities noted Psych:  Normal affect     Laboratory Data:  High Sensitivity Troponin:   Recent Labs  Lab 03/05/20 1330 03/05/20 1540 03/06/20 0412  TROPONINIHS 81* 86* 690*     Chemistry Recent Labs  Lab 03/05/20 1330 03/06/20 0412  NA 131* 133*  K 4.5 4.0  CL 94* 93*  CO2 26 25  GLUCOSE 311* 424*  BUN 22* 24*  CREATININE  0.88 0.88  CALCIUM 9.2 9.7  GFRNONAA >60 >60  GFRAA >60 >60  ANIONGAP 11 15    Recent Labs  Lab 03/05/20 1330  PROT 7.4  ALBUMIN 3.7  AST 36  ALT 21  ALKPHOS 113  BILITOT 1.1   Hematology Recent Labs  Lab 03/05/20 1330 03/06/20 0412  WBC 12.6* 10.7*  RBC 3.89* 4.28  HGB 11.1* 12.3*  HCT 35.1* 38.1*  MCV  90.2 89.0  MCH 28.5 28.7  MCHC 31.6 32.3  RDW 13.5 13.5  PLT 280 299   BNP Recent Labs  Lab 03/05/20 1330  BNP 463.0*    DDimer  Recent Labs  Lab 03/05/20 1330  DDIMER 1.08*     Radiology/Studies:  CT Angio Chest PE W/Cm &/Or Wo Cm  Result Date: 03/05/2020 CLINICAL DATA:  Shortness of breath. EXAM: CT ANGIOGRAPHY CHEST WITH CONTRAST TECHNIQUE: Multidetector CT imaging of the chest was performed using the standard protocol during bolus administration of intravenous contrast. Multiplanar CT image reconstructions and MIPs were obtained to evaluate the vascular anatomy. CONTRAST:  18mL OMNIPAQUE IOHEXOL 350 MG/ML SOLN COMPARISON:  December 20, 2019. FINDINGS: Cardiovascular: Satisfactory opacification of the pulmonary arteries to the segmental level. No evidence of pulmonary embolism. Mild cardiomegaly. No pericardial effusion. Coronary artery calcifications are noted. Mediastinum/Nodes: Stable 1.5 cm anterior mediastinal lymph node is noted. Stable mildly enlarged right paratracheal and aortopulmonary window adenopathy is noted. This most likely is reactive in etiology. Lungs/Pleura: No pneumothorax is noted. Minimal bilateral pleural effusions are noted with adjacent subsegmental atelectasis. Mild right middle lobe subsegmental atelectasis is noted. Upper Abdomen: No acute abnormality. Musculoskeletal: No chest wall abnormality. No acute or significant osseous findings. Review of the MIP images confirms the above findings. IMPRESSION: 1. No definite evidence of pulmonary embolus. 2. Coronary artery calcifications are noted suggesting coronary artery disease. 3. Minimal  bilateral pleural effusions are noted with adjacent subsegmental atelectasis. Mild right middle lobe subsegmental atelectasis is noted. 4. Stable mediastinal adenopathy is noted which most likely is reactive in etiology. Electronically Signed   By: Marijo Conception M.D.   On: 03/05/2020 17:39   DG Chest Port 1 View  Result Date: 03/05/2020 CLINICAL DATA:  Shortness of breath. EXAM: PORTABLE CHEST 1 VIEW COMPARISON:  12/24/2019 FINDINGS: The heart is borderline enlarged. The mediastinal and hilar contours are within normal limits. Slightly prominent interstitial markings could suggest bronchitis. No definite infiltrates or effusions. No pulmonary lesions. IMPRESSION: Slightly prominent interstitial markings could suggest bronchitis. No definite infiltrates or effusions. Electronically Signed   By: Marijo Sanes M.D.   On: 03/05/2020 13:27   {  Assessment and Plan:   1. NSTEMI - atypical chest pain, however, uptrending troponin to 600 so far. Diabetic, risk for atypical angina - EKG with new inferior ST depressions. Some prior lateral changes on old EKGs but inferior changes are new.   - medical therapy ASA 81, atorva 40, hep gtt, toprol 200. Ongoing atypical chest pain, trial of NG drip. He has ACEI allergy, maybe ARB in next few days - follow up echo - follow trop trend - likely cath when more euvolemic   2. Acute on chronic diastolic HF - [id number] echo LVE 50-55%, no WMAs, normal RV function. Indet diastolic function Repeat echo pending  - ongoing volume overload. Increase lasix to 60mg  IV bid. Place condom cath, strict I/Os - follow up repeat echo to assess for any new cardiac dysfunction      For questions or updates, please contact [place] Please consult [email or web address] for contact info under     Signed, Carlyle Dolly, MD  03/06/2020 8:35 AM

## 2020-03-06 NOTE — Progress Notes (Signed)
*  PRELIMINARY RESULTS* Echocardiogram 2D Echocardiogram LIMITED has been performed.  Shane Chambers 03/06/2020, 12:06 PM

## 2020-03-06 NOTE — Care Plan (Signed)
CRITICAL VALUE ALERT  Critical Value:  Trop 1933  Date & Time Notied:  03/06/20 1028a  Provider Notified: Dr. Manuella Ghazi   Orders Received/Actions taken: No new orders, Heparin and Nitro infusing. Chest pain free

## 2020-03-06 NOTE — Progress Notes (Signed)
CRITICAL VALUE ALERT  Critical Value: Troponin 690  Date & Time Notied:  4/27 0546  Provider Notified: Volanda Napoleon  Orders Received/Actions taken: pending orders

## 2020-03-06 NOTE — Progress Notes (Signed)
PROGRESS NOTE    Shane Chambers  [id number] DOB: 24-Mar-1961 DOA: 03/05/2020 PCP: [name], MD   Brief Narrative:  Per HPI: Shane Chambers is a 59 y.o. male with medical history significant for type 2 diabetes mellitus complicated by peripheral polyneuropathy, hypertension, BPH, chronically elevated troponin, who is admitted to [facility] on 03/05/2020 with suspected acute on chronic diastolic heart failure after presenting from home to [facility] Emergency Department complaining of shortness of breath.   The patient reports 3 to 4 days of progressive shortness of breath associated with orthopnea and increased peripheral edema.  He denies any associated chest pain, palpitations, diaphoresis, nausea, vomiting, dizziness, presyncope, or syncope.  Denies any associated cough, hemoptysis, or subjective fever, chills, rigors, or generalized myalgias.  Denies any associated calf tenderness or erythema associate with the bilateral lower extremities.  Denies any recent trauma or travel.  Not associate with any recent diarrhea, melena, or hematochezia.  Denies any associated wheezing.  The patient reports that he does not have a scale at home, and is therefore unsure as to any recent change in body weight.  4/27: Patient noted to have significant troponin elevations up to 3895 with no chest pain or other symptoms noted.  Seen by cardiology this a.m. and is to continue heparin drip as well as nitroglycerin.  He is still not euvolemic and plans are to continue diuresis with increased dose of IV Lasix to 60 mg twice a day.  Patient will need cardiac catheterization eventually.  Assessment & Plan:   Principal Problem:   Acute on chronic diastolic heart failure (HCC) Active Problems:   SOB (shortness of breath)   Hypertension   Type 2 diabetes mellitus (HCC)   Hyperlipidemia   BPH (benign prostatic hyperplasia)   Acute respiratory failure with hypoxia (HCC)   Acute hyponatremia    NSTEMI -Appreciate cardiology evaluation with eventual need for catheterization once euvolemic -Continue medical therapy with aspirin, atorvastatin, Toprol, and heparin drip as well as nitroglycerin drip -Follow-up 2D echocardiogram -Continue troponin trend which is currently upward trending  Acute hypoxemic respiratory failure secondary to acute on chronic diastolic CHF decompensation -Repeat 2D echocardiogram limited ordered and pending -Prior 2D echocardiogram 2/21 with LVEF 50-55% -Lasix to 60 mg IV twice daily per cardiology and follow I's and O's as well as daily weights -Wean oxygen as tolerated  Type 2 diabetes with hyperglycemia -Insulin sliding scale increased to every 4 hours on aggressive scale dosing -A1c on 1/21 9.8% with repeat pending -Monitor closely on carb modified diet -Continued on long-acting insulin 10 units twice daily -We will obtain diabetes coordinator recommendations  Essential hypertension-stable -Continue to monitor closely on nitroglycerin drip and aggressive Lasix diuresis -Continue metoprolol  Dyslipidemia -Continue home statin  BPH -Continue tamsulosin  DVT prophylaxis: Heparin drip Code Status: Full code Family Communication: None at bedside, discussed with patient Disposition Plan: Continue aggressive diuresis and conservative treatment for NSTEMI at the moment.  Plan for cardiac catheterization once euvolemic.   Consultants:   Cardiology  Procedures:   Limited 2D echocardiogram 4/27 with results pending  Antimicrobials:   None   Subjective: Patient seen and evaluated today with some intermittent chest burning noted and this is substernal.  He also has ongoing shortness of breath.  Objective: Vitals:   03/06/20 0900 03/06/20 1000 03/06/20 1100 03/06/20 1145  BP:  136/80 122/85   Pulse:  86    Resp:  (!) 24 (!) 24   Temp: 98 F (36.7  C)   97.8 F (36.6 C)  TempSrc: Axillary   Oral  SpO2: 96% 97% 100%   Weight:       Height:        Intake/Output Summary (Last 24 hours) at 03/06/2020 1211 Last data filed at 03/06/2020 1010 Gross per 24 hour  Intake 240 ml  Output 3525 ml  Net -3285 ml   Filed Weights   03/05/20 1131 03/05/20 2218 03/06/20 0711  Weight: 134.3 kg 134.3 kg 133.3 kg    Examination:  General exam: Appears calm and comfortable  Respiratory system: Clear to auscultation. Respiratory effort normal.  Currently on 2 L nasal cannula oxygen. Cardiovascular system: S1 & S2 heard, RRR. No JVD, murmurs, rubs, gallops or clicks. No pedal edema. Gastrointestinal system: Abdomen is nondistended, soft and nontender. No organomegaly or masses felt. Normal bowel sounds heard. Central nervous system: Alert and oriented. No focal neurological deficits. Extremities: Symmetric 5 x 5 power. Skin: No rashes, lesions or ulcers Psychiatry: Judgement and insight appear normal. Mood & affect appropriate.     Data Reviewed: I have personally reviewed following labs and imaging studies  CBC: Recent Labs  Lab 03/05/20 1330 03/06/20 0412  WBC 12.6* 10.7*  NEUTROABS 9.2* 9.5*  HGB 11.1* 12.3*  HCT 35.1* 38.1*  MCV 90.2 89.0  PLT 280 [id number]   Basic Metabolic Panel: Recent Labs  Lab 03/05/20 1330 03/05/20 1540 03/06/20 0412  NA 131*  --  133*  K 4.5  --  4.0  CL 94*  --  93*  CO2 26  --  25  GLUCOSE 311*  --  424*  BUN 22*  --  24*  CREATININE 0.88  --  0.88  CALCIUM 9.2  --  9.7  MG  --  1.8 1.7  PHOS  --   --  5.6*   GFR: Estimated Creatinine Clearance: 125.4 mL/min (by C-G formula based on SCr of 0.88 mg/dL). Liver Function Tests: Recent Labs  Lab 03/05/20 1330  AST 36  ALT 21  ALKPHOS 113  BILITOT 1.1  PROT 7.4  ALBUMIN 3.7   No results for input(s): LIPASE, AMYLASE in the last 168 hours. No results for input(s): AMMONIA in the last 168 hours. Coagulation Profile: Recent Labs  Lab 03/06/20 0412 03/06/20 0648  INR 1.1 1.1   Cardiac Enzymes: No results for input(s):  CKTOTAL, CKMB, CKMBINDEX, TROPONINI in the last 168 hours. BNP (last 3 results) No results for input(s): PROBNP in the last 8760 hours. HbA1C: No results for input(s): HGBA1C in the last 72 hours. CBG: Recent Labs  Lab 03/06/20 0114 03/06/20 0841 03/06/20 1055  GLUCAP 441* 432* 388*   Lipid Profile: No results for input(s): CHOL, HDL, LDLCALC, TRIG, CHOLHDL, LDLDIRECT in the last 72 hours. Thyroid Function Tests: Recent Labs    03/05/20 1540  TSH 3.350   Anemia Panel: No results for input(s): VITAMINB12, FOLATE, FERRITIN, TIBC, IRON, RETICCTPCT in the last 72 hours. Sepsis Labs: No results for input(s): PROCALCITON, LATICACIDVEN in the last 168 hours.  Recent Results (from the past 240 hour(s))  Respiratory Panel by RT PCR (Flu A&B, Covid) - Nasopharyngeal Swab     Status: None   Collection Time: 03/05/20  1:29 PM   Specimen: Nasopharyngeal Swab  Result Value Ref Range Status   SARS Coronavirus 2 by RT PCR NEGATIVE NEGATIVE Final    Comment: (NOTE) SARS-CoV-2 target nucleic acids are NOT DETECTED. The SARS-CoV-2 RNA is generally detectable in upper respiratoy specimens during the acute  phase of infection. The lowest concentration of SARS-CoV-2 viral copies this assay can detect is 131 copies/mL. A negative result does not preclude SARS-Cov-2 infection and should not be used as the sole basis for treatment or other patient management decisions. A negative result may occur with  improper specimen collection/handling, submission of specimen other than nasopharyngeal swab, presence of viral mutation(s) within the areas targeted by this assay, and inadequate number of viral copies (<131 copies/mL). A negative result must be combined with clinical observations, patient history, and epidemiological information. The expected result is Negative. Fact Sheet for Patients:  PinkCheek.be Fact Sheet for Healthcare Providers:   GravelBags.it This test is not yet ap proved or cleared by the [place] FDA and  has been authorized for detection and/or diagnosis of SARS-CoV-2 by FDA under an Emergency Use Authorization (EUA). This EUA will remain  in effect (meaning this test can be used) for the duration of the COVID-19 declaration under Section 564(b)(1) of the Act, 21 U.S.C. section 360bbb-3(b)(1), unless the authorization is terminated or revoked sooner.    Influenza A by PCR NEGATIVE NEGATIVE Final   Influenza B by PCR NEGATIVE NEGATIVE Final    Comment: (NOTE) The Xpert Xpress SARS-CoV-2/FLU/RSV assay is intended as an aid in  the diagnosis of influenza from Nasopharyngeal swab specimens and  should not be used as a sole basis for treatment. Nasal washings and  aspirates are unacceptable for Xpert Xpress SARS-CoV-2/FLU/RSV  testing. Fact Sheet for Patients: PinkCheek.be Fact Sheet for Healthcare Providers: GravelBags.it This test is not yet approved or cleared by the [place] FDA and  has been authorized for detection and/or diagnosis of SARS-CoV-2 by  FDA under an Emergency Use Authorization (EUA). This EUA will remain  in effect (meaning this test can be used) for the duration of the  Covid-19 declaration under Section 564(b)(1) of the Act, 21  U.S.C. section 360bbb-3(b)(1), unless the authorization is  terminated or revoked. Performed at [facility], [address] [place], [place] [address]   MRSA PCR Screening     Status: None   Collection Time: 03/06/20 12:36 AM   Specimen: Nasal Mucosa; Nasopharyngeal  Result Value Ref Range Status   MRSA by PCR NEGATIVE NEGATIVE Final    Comment:        The GeneXpert MRSA Assay (FDA approved for NASAL specimens only), is one component of a comprehensive MRSA colonization surveillance program. It is not intended to diagnose MRSA infection nor to guide or  monitor treatment for MRSA infections. Performed at [facility], [address] [place], [place] [address]          Radiology Studies: CT Angio Chest PE W/Cm &/Or Wo Cm  Result Date: 03/05/2020 CLINICAL DATA:  Shortness of breath. EXAM: CT ANGIOGRAPHY CHEST WITH CONTRAST TECHNIQUE: Multidetector CT imaging of the chest was performed using the standard protocol during bolus administration of intravenous contrast. Multiplanar CT image reconstructions and MIPs were obtained to evaluate the vascular anatomy. CONTRAST:  122mL OMNIPAQUE IOHEXOL 350 MG/ML SOLN COMPARISON:  December 20, 2019. FINDINGS: Cardiovascular: Satisfactory opacification of the pulmonary arteries to the segmental level. No evidence of pulmonary embolism. Mild cardiomegaly. No pericardial effusion. Coronary artery calcifications are noted. Mediastinum/Nodes: Stable 1.5 cm anterior mediastinal lymph node is noted. Stable mildly enlarged right paratracheal and aortopulmonary window adenopathy is noted. This most likely is reactive in etiology. Lungs/Pleura: No pneumothorax is noted. Minimal bilateral pleural effusions are noted with adjacent subsegmental atelectasis. Mild right middle lobe subsegmental atelectasis is  noted. Upper Abdomen: No acute abnormality. Musculoskeletal: No chest wall abnormality. No acute or significant osseous findings. Review of the MIP images confirms the above findings. IMPRESSION: 1. No definite evidence of pulmonary embolus. 2. Coronary artery calcifications are noted suggesting coronary artery disease. 3. Minimal bilateral pleural effusions are noted with adjacent subsegmental atelectasis. Mild right middle lobe subsegmental atelectasis is noted. 4. Stable mediastinal adenopathy is noted which most likely is reactive in etiology. Electronically Signed   By: Marijo Conception M.D.   On: 03/05/2020 17:39   DG Chest Port 1 View  Result Date: 03/05/2020 CLINICAL DATA:  Shortness of breath. EXAM: PORTABLE CHEST  1 VIEW COMPARISON:  12/24/2019 FINDINGS: The heart is borderline enlarged. The mediastinal and hilar contours are within normal limits. Slightly prominent interstitial markings could suggest bronchitis. No definite infiltrates or effusions. No pulmonary lesions. IMPRESSION: Slightly prominent interstitial markings could suggest bronchitis. No definite infiltrates or effusions. Electronically Signed   By: Marijo Sanes M.D.   On: 03/05/2020 13:27        Scheduled Meds: . aspirin EC  81 mg Oral Daily  . atorvastatin  40 mg Oral QHS  . Chlorhexidine Gluconate Cloth  6 each Topical Daily  . ferrous sulfate  325 mg Oral Q breakfast  . furosemide  60 mg Intravenous BID  . gabapentin  900 mg Oral BID  . insulin aspart  0-20 Units Subcutaneous Q4H  . insulin glargine  10 Units Subcutaneous BID  . metoprolol  200 mg Oral Daily  . sodium chloride flush  3 mL Intravenous Q12H  . tamsulosin  0.4 mg Oral Daily   Continuous Infusions: . heparin 1,400 Units/hr (03/06/20 0822)  . nitroGLYCERIN 5 mcg/min (03/06/20 0939)     LOS: 1 day    Time spent: 40 minutes    Zendaya Groseclose D Manuella Ghazi, DO [facility]  If 7PM-7AM, please contact night-coverage [email or web address] 03/06/2020, 12:11 PM

## 2020-03-06 NOTE — Progress Notes (Signed)
ANTICOAGULATION CONSULT NOTE - Initial Consult  Pharmacy Consult for Heparin Indication: chest pain/ACS  Allergies  Allergen Reactions  . Ace Inhibitors Swelling and Cough  . Other Itching    [name]  . Penicillins     Has patient had a PCN reaction causing immediate rash, facial/tongue/throat swelling, SOB or lightheadedness with hypotension: Unknown Has patient had a PCN reaction causing severe rash involving mucus membranes or skin necrosis: Unknown Has patient had a PCN reaction that required hospitalization: Unknown Has patient had a PCN reaction occurring within the last 10 years: Unknown If all of the above answers are "NO", then may proceed with Cephalosporin use.     Patient Measurements: Height: 5\' 9"  (175.3 cm) Weight: 134.3 kg (296 lb 1.2 oz) IBW/kg (Calculated) : 70.7 Heparin Dosing Weight: 102 kg  Vital Signs: Temp: 98.5 F (36.9 C) (04/27 0342) Temp Source: Oral (04/27 0342) BP: 155/83 (04/27 0400) Pulse Rate: 96 (04/27 0400)  Labs: Recent Labs    03/05/20 1330 03/05/20 1540 03/06/20 0412  HGB 11.1*  --  12.3*  HCT 35.1*  --  38.1*  PLT 280  --  299  LABPROT  --   --  14.3  INR  --   --  1.1  CREATININE 0.88  --  0.88  TROPONINIHS 81* 86* 690*    Estimated Creatinine Clearance: 125.9 mL/min (by C-G formula based on SCr of 0.88 mg/dL).   Medical History: Past Medical History:  Diagnosis Date  . Depression   . Diabetes mellitus without complication ([place])   . Hyperlipidemia 12/01/2019  . Hypertension   . Type 2 diabetes mellitus (HCC)     Medications:  Medications Prior to Admission  Medication Sig Dispense Refill Last Dose  . albuterol (VENTOLIN HFA) 108 (90 Base) MCG/ACT inhaler Inhale 1-2 puffs into the lungs every 6 (six) hours as needed for wheezing or shortness of breath.      Marland Kitchen amLODipine (NORVASC) 10 MG tablet Take 10 mg by mouth daily.     Marland Kitchen aspirin 81 MG EC tablet Take 81 mg by mouth daily.      Marland Kitchen atorvastatin (LIPITOR) 40 MG  tablet Take 40 mg by mouth daily.     Marland Kitchen docusate sodium (COLACE) 100 MG capsule Take 1 capsule (100 mg total) by mouth 2 (two) times daily. 10 capsule 0   . fluticasone (VERAMYST) 27.5 MCG/SPRAY nasal spray Place 2 sprays into the nose daily as needed for rhinitis or allergies.      . furosemide (LASIX) 20 MG tablet Take 1 tablet (20 mg total) by mouth daily. Take an extra 20mg  by mouth if noted to have greater than 3 lb weight gain in 24 hours. 30 tablet 11   . gabapentin (NEURONTIN) 300 MG capsule Take 900 mg by mouth 2 (two) times daily.      . indomethacin (INDOCIN) 25 MG capsule Take 25 mg by mouth 2 (two) times daily as needed for mild pain or moderate pain.      Marland Kitchen insulin aspart protamine- aspart (NOVOLOG MIX 70/30) (70-30) 100 UNIT/ML injection Inject 0.55 mLs (55 Units total) into the skin 2 (two) times daily with a meal. 10 mL 11   . Iron, Ferrous Sulfate, 325 (65 Fe) MG TABS Take 1 tablet by mouth daily. 30 tablet 1   . metFORMIN (GLUCOPHAGE) 1000 MG tablet Take 1,000 mg by mouth 2 (two) times daily with a meal.     . metoprolol (TOPROL-XL) 200 MG 24 hr tablet  Take 200 mg by mouth daily.     . Multiple Vitamins-Minerals (TAB-A-VITE) TABS Take 1 tablet by mouth daily.     Marland Kitchen omeprazole (PRILOSEC) 40 MG capsule Take 40 mg by mouth daily.     . polyethylene glycol (MIRALAX / GLYCOLAX) 17 g packet Take 17 g by mouth daily as needed for mild constipation. 14 each 0   . potassium chloride (KLOR-CON) 10 MEQ tablet Take 1 tablet (10 mEq total) by mouth daily. 30 tablet 2   . tamsulosin (FLOMAX) 0.4 MG CAPS capsule Take 0.4 mg by mouth daily.     Marland Kitchen venlafaxine XR (EFFEXOR-XR) 150 MG 24 hr capsule Take 150 mg by mouth daily with breakfast.       Assessment: 59 y/o M admitted with ShOB and chest pain. Pharmacy consulted to initiate heparin infusion.   Goal of Therapy:  Heparin level 0.3-0.7 units/ml Monitor platelets by anticoagulation protocol: Yes   Plan:  Give 4000 units bolus x 1 Start  heparin infusion at 1400 units/hr Check anti-Xa level in 6 hours and daily while on heparin Continue to monitor H&H and platelets  Ulice Dash D 03/06/2020,6:23 AM

## 2020-03-06 NOTE — Progress Notes (Signed)
Pt states chest pain has remained unchanged from admission. He describes it as in his "bronchiol area" non radiating. He is now asking for something for pain. Obtained a new EKG. Contacted Dr. Volanda Napoleon who put in new orders. Will continue to monitor patient.

## 2020-03-06 NOTE — Plan of Care (Signed)
CRITICAL VALUE ALERT  Critical Value:  Trop [id number]  Date & Time Notied:  03/06/2120 1146  Provider Notified: Dr. Manuella Ghazi  Orders Received/Actions taken: Will continue to monitor

## 2020-03-06 NOTE — Plan of Care (Signed)
CRITICAL VALUE ALERT  Critical Value:  Trop [id number]  Date & Time Notied:  03/06/20 1449  Provider Notified: Dr. Manuella Ghazi  Orders Received/Actions taken: Free of chest pain, nitro and heparin infusing

## 2020-03-06 NOTE — Progress Notes (Signed)
CRITICAL VALUE ALERT  Critical Value:  [id number]  Date & Time Notied:  4/27 1724  Provider Notified: Dr. Manuella Ghazi  Actions taken: NTG and Heparin infusing

## 2020-03-06 NOTE — Progress Notes (Signed)
Inpatient Diabetes Program Recommendations  AACE/ADA: New Consensus Statement on Inpatient Glycemic Control (2015)  Target Ranges:  Prepandial:   less than 140 mg/dL      Peak postprandial:   less than 180 mg/dL (1-2 hours)      Critically ill patients:  140 - 180 mg/dL   Lab Results  Component Value Date   GLUCAP 388 (H) 03/06/2020   HGBA1C 10.2 (H) 03/06/2020    Review of Glycemic Control Results for Shane Chambers, Shane Chambers (MRN [id number]) as of 03/06/2020 13:38  Ref. Range 03/06/2020 01:14 03/06/2020 08:41 03/06/2020 10:55  Glucose-Capillary Latest Ref Range: 70 - 99 mg/dL 441 (H) 432 (H) 388 (H)     Diabetes history: DM2 Outpatient Diabetes medications: 70/30 65 units BID + Metformin 1000 mg BID Current orders for Inpatient glycemic control: Lantus 10 units BID + novolog 0-20 Q4H  Inpatient Diabetes Program Recommendations:     Please consider Lantus 45 units daily (50% of home dose)   Thank you, Reche Dixon, RN, BSN Diabetes Coordinator Inpatient Diabetes Program [phone] (team pager from 8a-5p)

## 2020-03-06 NOTE — Progress Notes (Signed)
[place] for Heparin Indication: chest pain/ACS  Allergies  Allergen Reactions  . Ace Inhibitors Swelling and Cough  . Other Itching    [name]  . Penicillins     Has patient had a PCN reaction causing immediate rash, facial/tongue/throat swelling, SOB or lightheadedness with hypotension: Unknown Has patient had a PCN reaction causing severe rash involving mucus membranes or skin necrosis: Unknown Has patient had a PCN reaction that required hospitalization: Unknown Has patient had a PCN reaction occurring within the last 10 years: Unknown If all of the above answers are "NO", then may proceed with Cephalosporin use.     Patient Measurements: Height: 5\' 9"  (175.3 cm) Weight: 133.3 kg (293 lb 14 oz) IBW/kg (Calculated) : 70.7 Heparin Dosing Weight: 102 kg  Vital Signs: Temp: 98.1 F (36.7 C) (04/27 1916) Temp Source: Oral (04/27 1916) BP: 111/81 (04/27 2200) Pulse Rate: 77 (04/27 2200)  Labs: Recent Labs    03/05/20 1330 03/05/20 1540 03/06/20 0412 03/06/20 0648 03/06/20 0909 03/06/20 1347 03/06/20 1500 03/06/20 1613 03/06/20 1800 03/06/20 2003 03/06/20 2204  HGB 11.1*  --  12.3*  --   --   --   --   --   --   --   --   HCT 35.1*  --  38.1*  --   --   --   --   --   --   --   --   PLT 280  --  299  --   --   --   --   --   --   --   --   APTT  --   --   --  30  --   --   --   --   --   --   --   LABPROT  --   --  14.3 14.1  --   --   --   --   --   --   --   INR  --   --  1.1 1.1  --   --   --   --   --   --   --   HEPARINUNFRC  --   --   --   --   --   --  0.16*  --   --   --  0.41  CREATININE 0.88  --  0.88  --   --   --   --   --   --   --   --   TROPONINIHS 81*   < > 690*  --    < >   < >  --  4,752* 4,111* 3,519*  --    < > = values in this interval not displayed.    Estimated Creatinine Clearance: 125.4 mL/min (by C-G formula based on SCr of 0.88 mg/dL).   Medical History: Past Medical History:  Diagnosis  Date  . Depression   . Diabetes mellitus without complication ([place])   . Hyperlipidemia 12/01/2019  . Hypertension   . Type 2 diabetes mellitus (HCC)     Medications:  Medications Prior to Admission  Medication Sig Dispense Refill Last Dose  . albuterol (VENTOLIN HFA) 108 (90 Base) MCG/ACT inhaler Inhale 1-2 puffs into the lungs every 6 (six) hours as needed for wheezing or shortness of breath.    03/04/2020  . amLODipine (NORVASC) 10 MG tablet Take 10 mg by mouth daily.   03/03/2020  .  aspirin 81 MG EC tablet Take 81 mg by mouth daily.    03/04/2020 at 0000  . atorvastatin (LIPITOR) 40 MG tablet Take 40 mg by mouth daily.   03/03/2020  . calcium carbonate (OS-CAL - DOSED IN MG OF ELEMENTAL CALCIUM) 1250 (500 Ca) MG tablet Take 1 tablet by mouth daily.   Past Week at Unknown time  . chlorthalidone (HYGROTON) 25 MG tablet Take 25 mg by mouth daily.   03/03/2020  . CINNAMON PO Take 1 capsule by mouth daily.   Past Week at Unknown time  . docusate sodium (COLACE) 100 MG capsule Take 1 capsule (100 mg total) by mouth 2 (two) times daily. (Patient taking differently: Take 100 mg by mouth 2 (two) times daily as needed. ) 10 capsule 0   . fluticasone (VERAMYST) 27.5 MCG/SPRAY nasal spray Place 2 sprays into the nose daily as needed for rhinitis or allergies.    03/03/2020  . furosemide (LASIX) 20 MG tablet Take 1 tablet (20 mg total) by mouth daily. Take an extra 20mg  by mouth if noted to have greater than 3 lb weight gain in 24 hours. (Patient taking differently: Take 20 mg by mouth 2 (two) times daily as needed. Take an extra 20mg  by mouth if noted to have greater than 3 lb weight gain in 24 hours.) 30 tablet 11 03/03/2020  . gabapentin (NEURONTIN) 300 MG capsule Take 900 mg by mouth 2 (two) times daily.    03/03/2020  . indomethacin (INDOCIN) 25 MG capsule Take 25 mg by mouth 2 (two) times daily as needed for mild pain or moderate pain.      Marland Kitchen insulin aspart protamine- aspart (NOVOLOG MIX 70/30) (70-30) 100  UNIT/ML injection Inject 0.55 mLs (55 Units total) into the skin 2 (two) times daily with a meal. (Patient taking differently: Inject 65 Units into the skin 2 (two) times daily with a meal. ) 10 mL 11 03/04/2020 at 0000  . Iron, Ferrous Sulfate, 325 (65 Fe) MG TABS Take 1 tablet by mouth daily. 30 tablet 1 03/03/2020  . MAGNESIUM PO Take 1 tablet by mouth daily.   Past Week at Unknown time  . metFORMIN (GLUCOPHAGE) 1000 MG tablet Take 1,000 mg by mouth 2 (two) times daily with a meal.   03/04/2020 at 0000  . metoprolol (TOPROL-XL) 200 MG 24 hr tablet Take 200 mg by mouth daily.   03/04/2020 at 0000  . Multiple Vitamins-Minerals (TAB-A-VITE) TABS Take 1 tablet by mouth daily.   03/03/2020  . omeprazole (PRILOSEC) 40 MG capsule Take 40 mg by mouth daily.   03/03/2020  . polyethylene glycol (MIRALAX / GLYCOLAX) 17 g packet Take 17 g by mouth daily as needed for mild constipation. 14 each 0   . potassium chloride (KLOR-CON) 10 MEQ tablet Take 1 tablet (10 mEq total) by mouth daily. 30 tablet 2 03/03/2020  . tamsulosin (FLOMAX) 0.4 MG CAPS capsule Take 0.4 mg by mouth daily.   03/03/2020  . venlafaxine XR (EFFEXOR-XR) 150 MG 24 hr capsule Take 150 mg by mouth daily with breakfast.   03/03/2020    Assessment: 59 y/o M admitted with ShOB and chest pain. Pharmacy consulted to initiate heparin infusion. Cardiology consult ordered and diagnosed with NSTEMI.  4/27 PM update:  Heparin level therapeutic after rate increase Troponin starting to come down  Goal of Therapy:  Heparin level 0.3-0.7 units/ml Monitor platelets by anticoagulation protocol: Yes   Plan:  Cont heparin at 1700 units/hr Heparin level with AM labs  Narda Bonds, PharmD, BCPS Clinical Pharmacist Phone: [phone]

## 2020-03-06 NOTE — Progress Notes (Signed)
ANTICOAGULATION CONSULT NOTE -   Pharmacy Consult for Heparin Indication: chest pain/ACS  Allergies  Allergen Reactions  . Ace Inhibitors Swelling and Cough  . Other Itching    [name]  . Penicillins     Has patient had a PCN reaction causing immediate rash, facial/tongue/throat swelling, SOB or lightheadedness with hypotension: Unknown Has patient had a PCN reaction causing severe rash involving mucus membranes or skin necrosis: Unknown Has patient had a PCN reaction that required hospitalization: Unknown Has patient had a PCN reaction occurring within the last 10 years: Unknown If all of the above answers are "NO", then may proceed with Cephalosporin use.     Patient Measurements: Height: 5\' 9"  (175.3 cm) Weight: 133.3 kg (293 lb 14 oz) IBW/kg (Calculated) : 70.7 Heparin Dosing Weight: 102 kg  Vital Signs: Temp: 97.8 F (36.6 C) (04/27 1145) Temp Source: Oral (04/27 1145) BP: 128/76 (04/27 1400) Pulse Rate: 74 (04/27 1400)  Labs: Recent Labs    03/05/20 1330 03/05/20 1540 03/06/20 0412 03/06/20 0412 03/06/20 0648 03/06/20 0909 03/06/20 1114 03/06/20 1347 03/06/20 1500  HGB 11.1*  --  12.3*  --   --   --   --   --   --   HCT 35.1*  --  38.1*  --   --   --   --   --   --   PLT 280  --  299  --   --   --   --   --   --   APTT  --   --   --   --  30  --   --   --   --   LABPROT  --   --  14.3  --  14.1  --   --   --   --   INR  --   --  1.1  --  1.1  --   --   --   --   HEPARINUNFRC  --   --   --   --   --   --   --   --  0.16*  CREATININE 0.88  --  0.88  --   --   --   --   --   --   TROPONINIHS 81*   < > 690*   < >  --  1,933* 3,835* 4,590*  --    < > = values in this interval not displayed.    Estimated Creatinine Clearance: 125.4 mL/min (by C-G formula based on SCr of 0.88 mg/dL).   Medical History: Past Medical History:  Diagnosis Date  . Depression   . Diabetes mellitus without complication ([place])   . Hyperlipidemia 12/01/2019  . Hypertension   .  Type 2 diabetes mellitus (H[facility])     Medications:  Medications Prior to Admission  Medication Sig Dispense Refill Last Dose  . albuterol (VENTOLIN HFA) 108 (90 Base) MCG/ACT inhaler Inhale 1-2 puffs into the lungs every 6 (six) hours as needed for wheezing or shortness of breath.    03/04/2020  . amLODipine (NORVASC) 10 MG tablet Take 10 mg by mouth daily.   03/03/2020  . aspirin 81 MG EC tablet Take 81 mg by mouth daily.    03/04/2020 at 0000  . atorvastatin (LIPITOR) 40 MG tablet Take 40 mg by mouth daily.   03/03/2020  . calcium carbonate (OS-CAL - DOSED IN MG OF ELEMENTAL CALCIUM) 1250 (500 Ca) MG tablet Take 1 tablet by mouth  daily.   Past Week at Unknown time  . chlorthalidone (HYGROTON) 25 MG tablet Take 25 mg by mouth daily.   03/03/2020  . CINNAMON PO Take 1 capsule by mouth daily.   Past Week at Unknown time  . docusate sodium (COLACE) 100 MG capsule Take 1 capsule (100 mg total) by mouth 2 (two) times daily. (Patient taking differently: Take 100 mg by mouth 2 (two) times daily as needed. ) 10 capsule 0   . fluticasone (VERAMYST) 27.5 MCG/SPRAY nasal spray Place 2 sprays into the nose daily as needed for rhinitis or allergies.    03/03/2020  . furosemide (LASIX) 20 MG tablet Take 1 tablet (20 mg total) by mouth daily. Take an extra 20mg  by mouth if noted to have greater than 3 lb weight gain in 24 hours. (Patient taking differently: Take 20 mg by mouth 2 (two) times daily as needed. Take an extra 20mg  by mouth if noted to have greater than 3 lb weight gain in 24 hours.) 30 tablet 11 03/03/2020  . gabapentin (NEURONTIN) 300 MG capsule Take 900 mg by mouth 2 (two) times daily.    03/03/2020  . indomethacin (INDOCIN) 25 MG capsule Take 25 mg by mouth 2 (two) times daily as needed for mild pain or moderate pain.      Marland Kitchen insulin aspart protamine- aspart (NOVOLOG MIX 70/30) (70-30) 100 UNIT/ML injection Inject 0.55 mLs (55 Units total) into the skin 2 (two) times daily with a meal. (Patient taking  differently: Inject 65 Units into the skin 2 (two) times daily with a meal. ) 10 mL 11 03/04/2020 at 0000  . Iron, Ferrous Sulfate, 325 (65 Fe) MG TABS Take 1 tablet by mouth daily. 30 tablet 1 03/03/2020  . MAGNESIUM PO Take 1 tablet by mouth daily.   Past Week at Unknown time  . metFORMIN (GLUCOPHAGE) 1000 MG tablet Take 1,000 mg by mouth 2 (two) times daily with a meal.   03/04/2020 at 0000  . metoprolol (TOPROL-XL) 200 MG 24 hr tablet Take 200 mg by mouth daily.   03/04/2020 at 0000  . Multiple Vitamins-Minerals (TAB-A-VITE) TABS Take 1 tablet by mouth daily.   03/03/2020  . omeprazole (PRILOSEC) 40 MG capsule Take 40 mg by mouth daily.   03/03/2020  . polyethylene glycol (MIRALAX / GLYCOLAX) 17 g packet Take 17 g by mouth daily as needed for mild constipation. 14 each 0   . potassium chloride (KLOR-CON) 10 MEQ tablet Take 1 tablet (10 mEq total) by mouth daily. 30 tablet 2 03/03/2020  . tamsulosin (FLOMAX) 0.4 MG CAPS capsule Take 0.4 mg by mouth daily.   03/03/2020  . venlafaxine XR (EFFEXOR-XR) 150 MG 24 hr capsule Take 150 mg by mouth daily with breakfast.   03/03/2020    Assessment: 59 y/o M admitted with ShOB and chest pain. Pharmacy consulted to initiate heparin infusion. Cardiology consult ordered and diagnosed with NSTEMI.  HL 0.16: therapeutic Trop 4590  Goal of Therapy:  Heparin level 0.3-0.7 units/ml Monitor platelets by anticoagulation protocol: Yes   Plan:  Rebolus 3000 units x 1 Increase heparin infusion to 1700 units/hr. Check anti-Xa level in 6 hours and daily while on heparin. Continue to monitor H&H and platelets.  Margot Ables, PharmD Clinical Pharmacist 03/06/2020 3:36 PM

## 2020-03-07 ENCOUNTER — Encounter (HOSPITAL_COMMUNITY)
Admission: EM | Disposition: A | Discharge status: Skilled Nursing Facility | Payer: Self-pay | Source: Home / Self Care | Attending: Cardiothoracic Surgery

## 2020-03-07 DIAGNOSIS — I5021 Acute systolic (congestive) heart failure: Secondary | ICD-10-CM | POA: Diagnosis not present

## 2020-03-07 DIAGNOSIS — R400 Somnolence: Secondary | ICD-10-CM

## 2020-03-07 DIAGNOSIS — I5043 Acute on chronic combined systolic (congestive) and diastolic (congestive) heart failure: Secondary | ICD-10-CM

## 2020-03-07 DIAGNOSIS — I214 Non-ST elevation (NSTEMI) myocardial infarction: Secondary | ICD-10-CM | POA: Diagnosis not present

## 2020-03-07 HISTORY — PX: RIGHT/LEFT HEART CATH AND CORONARY ANGIOGRAPHY: CATH118266

## 2020-03-07 LAB — CBC
HCT: 36 % — ABNORMAL LOW (ref 39.0–52.0)
Hemoglobin: 11.3 g/dL — ABNORMAL LOW (ref 13.0–17.0)
MCH: 28.3 pg (ref 26.0–34.0)
MCHC: 31.4 g/dL (ref 30.0–36.0)
MCV: 90 fL (ref 80.0–100.0)
Platelets: 321 10*3/uL (ref 150–400)
RBC: 4 MIL/uL — ABNORMAL LOW (ref 4.22–5.81)
RDW: 13.4 % (ref 11.5–15.5)
WBC: 18 10*3/uL — ABNORMAL HIGH (ref 4.0–10.5)
nRBC: 0 % (ref 0.0–0.2)

## 2020-03-07 LAB — POCT I-STAT 7, (LYTES, BLD GAS, ICA,H+H)
Acid-Base Excess: 7 mmol/L — ABNORMAL HIGH (ref 0.0–2.0)
Bicarbonate: 31.2 mmol/L — ABNORMAL HIGH (ref 20.0–28.0)
Calcium, Ion: 1.22 mmol/L (ref 1.15–1.40)
HCT: 36 % — ABNORMAL LOW (ref 39.0–52.0)
Hemoglobin: 12.2 g/dL — ABNORMAL LOW (ref 13.0–17.0)
O2 Saturation: 99 %
Potassium: 3.5 mmol/L (ref 3.5–5.1)
Sodium: 137 mmol/L (ref 135–145)
TCO2: 32 mmol/L (ref 22–32)
pCO2 arterial: 43.1 mmHg (ref 32.0–48.0)
pH, Arterial: 7.467 — ABNORMAL HIGH (ref 7.350–7.450)
pO2, Arterial: 121 mmHg — ABNORMAL HIGH (ref 83.0–108.0)

## 2020-03-07 LAB — POCT I-STAT EG7
Acid-Base Excess: 6 mmol/L — ABNORMAL HIGH (ref 0.0–2.0)
Bicarbonate: 32.6 mmol/L — ABNORMAL HIGH (ref 20.0–28.0)
Calcium, Ion: 1.27 mmol/L (ref 1.15–1.40)
HCT: 37 % — ABNORMAL LOW (ref 39.0–52.0)
Hemoglobin: 12.6 g/dL — ABNORMAL LOW (ref 13.0–17.0)
O2 Saturation: 68 %
Potassium: 3.6 mmol/L (ref 3.5–5.1)
Sodium: 138 mmol/L (ref 135–145)
TCO2: 34 mmol/L — ABNORMAL HIGH (ref 22–32)
pCO2, Ven: 53.9 mmHg (ref 44.0–60.0)
pH, Ven: 7.389 (ref 7.250–7.430)
pO2, Ven: 37 mmHg (ref 32.0–45.0)

## 2020-03-07 LAB — BASIC METABOLIC PANEL
Anion gap: 13 (ref 5–15)
BUN: 36 mg/dL — ABNORMAL HIGH (ref 6–20)
CO2: 28 mmol/L (ref 22–32)
Calcium: 9.4 mg/dL (ref 8.9–10.3)
Chloride: 93 mmol/L — ABNORMAL LOW (ref 98–111)
Creatinine, Ser: 1.07 mg/dL (ref 0.61–1.24)
GFR calc Af Amer: >60 mL/min (ref >60)
GFR calc non Af Amer: >60 mL/min (ref >60)
Glucose, Bld: 228 mg/dL — ABNORMAL HIGH (ref 70–99)
Potassium: 3.4 mmol/L — ABNORMAL LOW (ref 3.5–5.1)
Sodium: 134 mmol/L — ABNORMAL LOW (ref 135–145)

## 2020-03-07 LAB — BLOOD GAS, ARTERIAL
Acid-Base Excess: 5.7 mmol/L — ABNORMAL HIGH (ref 0.0–2.0)
Bicarbonate: 29 mmol/L — ABNORMAL HIGH (ref 20.0–28.0)
FIO2: 21
O2 Saturation: 93.1 %
Patient temperature: 36.5
pCO2 arterial: 47.6 mmHg (ref 32.0–48.0)
pH, Arterial: 7.418 (ref 7.350–7.450)
pO2, Arterial: 70 mmHg — ABNORMAL LOW (ref 83.0–108.0)

## 2020-03-07 LAB — GLUCOSE, CAPILLARY
Glucose-Capillary: 217 mg/dL — ABNORMAL HIGH (ref 70–99)
Glucose-Capillary: 186 mg/dL — ABNORMAL HIGH (ref 70–99)
Glucose-Capillary: 201 mg/dL — ABNORMAL HIGH (ref 70–99)
Glucose-Capillary: 193 mg/dL — ABNORMAL HIGH (ref 70–99)
Glucose-Capillary: 177 mg/dL — ABNORMAL HIGH (ref 70–99)
Glucose-Capillary: 192 mg/dL — ABNORMAL HIGH (ref 70–99)
Glucose-Capillary: 274 mg/dL — ABNORMAL HIGH (ref 70–99)

## 2020-03-07 LAB — HEPARIN LEVEL (UNFRACTIONATED): Heparin Unfractionated: 0.45 IU/mL (ref 0.30–0.70)

## 2020-03-07 LAB — MAGNESIUM: Magnesium: 2 mg/dL (ref 1.7–2.4)

## 2020-03-07 SURGERY — RIGHT/LEFT HEART CATH AND CORONARY ANGIOGRAPHY
Anesthesia: LOCAL

## 2020-03-07 MED ORDER — LIDOCAINE HCL (PF) 1 % IJ SOLN
INTRAMUSCULAR | Status: DC | PRN
  Administered 2020-03-07 (×2): 2 mL

## 2020-03-07 MED ORDER — POTASSIUM CHLORIDE CRYS ER 20 MEQ PO TBCR
40.0000 meq | EXTENDED_RELEASE_TABLET | Freq: Once | ORAL | Status: AC
  Administered 2020-03-07: 40 meq via ORAL
  Filled 2020-03-07: qty 2

## 2020-03-07 MED ORDER — METOPROLOL SUCCINATE ER 100 MG PO TB24
100.0000 mg | ORAL_TABLET | Freq: Every day | ORAL | Status: DC
  Administered 2020-03-08 – 2020-03-12 (×5): 100 mg via ORAL
  Filled 2020-03-07: qty 1
  Filled 2020-03-07: qty 2
  Filled 2020-03-07 (×4): qty 1

## 2020-03-07 MED ORDER — HEPARIN (PORCINE) IN NACL 1000-0.9 UT/500ML-% IV SOLN
INTRAVENOUS | Status: DC | PRN
  Administered 2020-03-07 (×2): 500 mL

## 2020-03-07 MED ORDER — HEPARIN (PORCINE) 25000 UT/250ML-% IV SOLN
1500.0000 [IU]/h | INTRAVENOUS | Status: DC
  Administered 2020-03-07: 1700 [IU]/h via INTRAVENOUS
  Administered 2020-03-08 – 2020-03-10 (×5): 2000 [IU]/h via INTRAVENOUS
  Administered 2020-03-11: 1500 [IU]/h via INTRAVENOUS
  Administered 2020-03-11: 2000 [IU]/h via INTRAVENOUS
  Administered 2020-03-12: 1500 [IU]/h via INTRAVENOUS
  Filled 2020-03-07 (×9): qty 250

## 2020-03-07 MED ORDER — SODIUM CHLORIDE 0.9% FLUSH
3.0000 mL | Freq: Two times a day (BID) | INTRAVENOUS | Status: DC
  Administered 2020-03-07 – 2020-03-12 (×6): 3 mL via INTRAVENOUS

## 2020-03-07 MED ORDER — VERAPAMIL HCL 2.5 MG/ML IV SOLN
INTRAVENOUS | Status: AC
  Filled 2020-03-07: qty 2

## 2020-03-07 MED ORDER — HEPARIN SODIUM (PORCINE) 1000 UNIT/ML IJ SOLN
INTRAMUSCULAR | Status: DC | PRN
  Administered 2020-03-07: 6000 [IU] via INTRAVENOUS

## 2020-03-07 MED ORDER — SODIUM CHLORIDE 0.9% FLUSH: 3.0000 mL | INTRAVENOUS | Status: DC | PRN

## 2020-03-07 MED ORDER — HEPARIN (PORCINE) 25000 UT/250ML-% IV SOLN: 1700.0000 [IU]/h | INTRAVENOUS | Status: DC

## 2020-03-07 MED ORDER — HEPARIN SODIUM (PORCINE) 1000 UNIT/ML IJ SOLN
INTRAMUSCULAR | Status: AC
  Filled 2020-03-07: qty 1

## 2020-03-07 MED ORDER — SODIUM CHLORIDE 0.9 % IV SOLN: INTRAVENOUS | Status: DC

## 2020-03-07 MED ORDER — VERAPAMIL HCL 2.5 MG/ML IV SOLN
INTRAVENOUS | Status: DC | PRN
  Administered 2020-03-07: 10 mL via INTRA_ARTERIAL

## 2020-03-07 MED ORDER — IOHEXOL 350 MG/ML SOLN
INTRAVENOUS | Status: DC | PRN
  Administered 2020-03-07: 55 mL

## 2020-03-07 MED ORDER — LIDOCAINE HCL (PF) 1 % IJ SOLN
INTRAMUSCULAR | Status: AC
  Filled 2020-03-07: qty 30

## 2020-03-07 MED ORDER — HYDRALAZINE HCL 20 MG/ML IJ SOLN: 10.0000 mg | INTRAMUSCULAR | Status: AC | PRN

## 2020-03-07 MED ORDER — HEPARIN (PORCINE) IN NACL 1000-0.9 UT/500ML-% IV SOLN
INTRAVENOUS | Status: AC
  Filled 2020-03-07: qty 1000

## 2020-03-07 MED ORDER — SODIUM CHLORIDE 0.9 % IV SOLN: 250.0000 mL | INTRAVENOUS | Status: DC | PRN

## 2020-03-07 MED ORDER — ONDANSETRON HCL 4 MG/2ML IJ SOLN: 4.0000 mg | Freq: Four times a day (QID) | INTRAMUSCULAR | Status: DC | PRN

## 2020-03-07 MED ORDER — SODIUM CHLORIDE 0.9% FLUSH
3.0000 mL | Freq: Two times a day (BID) | INTRAVENOUS | Status: DC
  Administered 2020-03-07: 3 mL via INTRAVENOUS

## 2020-03-07 MED ORDER — LABETALOL HCL 5 MG/ML IV SOLN: 10.0000 mg | INTRAVENOUS | Status: AC | PRN

## 2020-03-07 SURGICAL SUPPLY — 13 items
CATH 5FR JL3.5 JR4 ANG PIG MP (CATHETERS) ×1 IMPLANT
CATH BALLN WEDGE 5F 110CM (CATHETERS) ×1 IMPLANT
DEVICE RAD COMP TR BAND LRG (VASCULAR PRODUCTS) ×1 IMPLANT
GLIDESHEATH SLEND SS 6F .021 (SHEATH) ×1 IMPLANT
GUIDEWIRE INQWIRE 1.5J.035X260 (WIRE) IMPLANT
HOVERMATT SINGLE USE (MISCELLANEOUS) ×1 IMPLANT
INQWIRE 1.5J .035X260CM (WIRE) ×2
KIT HEART LEFT (KITS) ×2 IMPLANT
PACK CARDIAC CATHETERIZATION (CUSTOM PROCEDURE TRAY) ×2 IMPLANT
SHEATH GLIDE SLENDER 4/5FR (SHEATH) ×1 IMPLANT
SHEATH PROBE COVER 6X72 (BAG) ×1 IMPLANT
TRANSDUCER W/STOPCOCK (MISCELLANEOUS) ×2 IMPLANT
TUBING CIL FLEX 10 FLL-RA (TUBING) ×2 IMPLANT

## 2020-03-07 NOTE — Progress Notes (Addendum)
Progress Note  Patient Name: Shane Chambers Date of Encounter: 03/07/2020  Primary Cardiologist: [facility], Dr Harl Bowie  Subjective   SOB improving. Drowsy this AM  Inpatient Medications    Scheduled Meds: . aspirin EC  81 mg Oral Daily  . atorvastatin  40 mg Oral QHS  . Chlorhexidine Gluconate Cloth  6 each Topical Daily  . ferrous sulfate  325 mg Oral Q breakfast  . furosemide  60 mg Intravenous BID  . gabapentin  900 mg Oral BID  . insulin aspart  0-20 Units Subcutaneous Q4H  . insulin glargine  10 Units Subcutaneous BID  . metoprolol  200 mg Oral Daily  . sodium chloride flush  3 mL Intravenous Q12H  . tamsulosin  0.4 mg Oral Daily   Continuous Infusions: . heparin 1,700 Units/hr (03/07/20 0400)  . nitroGLYCERIN 5 mcg/min (03/07/20 0400)   PRN Meds: acetaminophen **OR** acetaminophen, albuterol, morphine injection, nitroGLYCERIN   Vital Signs    Vitals:   03/07/20 0530 03/07/20 0600 03/07/20 0630 03/07/20 0733  BP: 124/72 110/62 (!) 111/54   Pulse: (!) 25 (!) 136 92   Resp: 15 17 17 18   Temp:    (!) 97.2 F (36.2 C)  TempSrc:    Axillary  SpO2:      Weight:      Height:        Intake/Output Summary (Last 24 hours) at 03/07/2020 0809 Last data filed at 03/07/2020 0500 Gross per 24 hour  Intake 470.16 ml  Output 2475 ml  Net -2004.84 ml   Last 3 Weights 03/07/2020 03/06/2020 03/05/2020  Weight (lbs) 296 lb 4.8 oz 293 lb 14 oz 296 lb 1.2 oz  Weight (kg) 134.4 kg 133.3 kg 134.3 kg      Telemetry    SR - Personally Reviewed  ECG    n/a - Personally Reviewed  Physical Exam   GEN: No acute distress.   Neck: No JVD Cardiac: RRR, no murmurs, rubs, or gallops.  Respiratory: faint crackles bilateral bases GI: Soft, nontender, non-distended  MS: trace bilateral edema; No deformity. Neuro:  Nonfocal  Psych: Normal affect   Labs    High Sensitivity Troponin:   Recent Labs  Lab 03/06/20 1114 03/06/20 1347 03/06/20 1613 03/06/20 1800 03/06/20 2003    TROPONINIHS 3,835* 4,590* 4,752* 4,111* 3,519*      Chemistry Recent Labs  Lab 03/05/20 1330 03/06/20 0412 03/07/20 0412  NA 131* 133* 134*  K 4.5 4.0 3.4*  CL 94* 93* 93*  CO2 26 25 28   GLUCOSE 311* 424* 228*  BUN 22* 24* 36*  CREATININE 0.88 0.88 1.07  CALCIUM 9.2 9.7 9.4  PROT 7.4  --   --   ALBUMIN 3.7  --   --   AST 36  --   --   ALT 21  --   --   ALKPHOS 113  --   --   BILITOT 1.1  --   --   GFRNONAA >60 >60 >60  GFRAA >60 >60 >60  ANIONGAP 11 15 13      Hematology Recent Labs  Lab 03/05/20 1330 03/06/20 0412 03/07/20 0412  WBC 12.6* 10.7* 18.0*  RBC 3.89* 4.28 4.00*  HGB 11.1* 12.3* 11.3*  HCT 35.1* 38.1* 36.0*  MCV 90.2 89.0 90.0  MCH 28.5 28.7 28.3  MCHC 31.6 32.3 31.4  RDW 13.5 13.5 13.4  PLT 280 299 321    BNP Recent Labs  Lab 03/05/20 1330  BNP 463.0*  DDimer  Recent Labs  Lab 03/05/20 1330  DDIMER 1.08*     Radiology    CT Angio Chest PE W/Cm &/Or Wo Cm  Result Date: 03/05/2020 CLINICAL DATA:  Shortness of breath. EXAM: CT ANGIOGRAPHY CHEST WITH CONTRAST TECHNIQUE: Multidetector CT imaging of the chest was performed using the standard protocol during bolus administration of intravenous contrast. Multiplanar CT image reconstructions and MIPs were obtained to evaluate the vascular anatomy. CONTRAST:  153mL OMNIPAQUE IOHEXOL 350 MG/ML SOLN COMPARISON:  December 20, 2019. FINDINGS: Cardiovascular: Satisfactory opacification of the pulmonary arteries to the segmental level. No evidence of pulmonary embolism. Mild cardiomegaly. No pericardial effusion. Coronary artery calcifications are noted. Mediastinum/Nodes: Stable 1.5 cm anterior mediastinal lymph node is noted. Stable mildly enlarged right paratracheal and aortopulmonary window adenopathy is noted. This most likely is reactive in etiology. Lungs/Pleura: No pneumothorax is noted. Minimal bilateral pleural effusions are noted with adjacent subsegmental atelectasis. Mild right middle lobe  subsegmental atelectasis is noted. Upper Abdomen: No acute abnormality. Musculoskeletal: No chest wall abnormality. No acute or significant osseous findings. Review of the MIP images confirms the above findings. IMPRESSION: 1. No definite evidence of pulmonary embolus. 2. Coronary artery calcifications are noted suggesting coronary artery disease. 3. Minimal bilateral pleural effusions are noted with adjacent subsegmental atelectasis. Mild right middle lobe subsegmental atelectasis is noted. 4. Stable mediastinal adenopathy is noted which most likely is reactive in etiology. Electronically Signed   By: Marijo Conception M.D.   On: 03/05/2020 17:39   DG Chest Port 1 View  Result Date: 03/05/2020 CLINICAL DATA:  Shortness of breath. EXAM: PORTABLE CHEST 1 VIEW COMPARISON:  12/24/2019 FINDINGS: The heart is borderline enlarged. The mediastinal and hilar contours are within normal limits. Slightly prominent interstitial markings could suggest bronchitis. No definite infiltrates or effusions. No pulmonary lesions. IMPRESSION: Slightly prominent interstitial markings could suggest bronchitis. No definite infiltrates or effusions. Electronically Signed   By: Marijo Sanes M.D.   On: 03/05/2020 13:27   ECHOCARDIOGRAM LIMITED  Result Date: 03/06/2020    ECHOCARDIOGRAM LIMITED REPORT   Patient Name:   Shane Chambers Date of Exam: 03/06/2020 Medical Rec #:  [id number]       Height:       69.0 in Accession #:    [id number]      Weight:       293.9 lb Date of Birth:  01/31/1961       BSA:          2.433 m Patient Age:    59 years        BP:           122/85 mmHg Patient Gender: M               HR:           81 bpm. Exam Location:  [name] Procedure: Limited Echo Indications:    Elevated Troponin  History:        Patient has prior history of Echocardiogram examinations, most                 recent 12/21/2019. Risk Factors:Diabetes, Hypertension,                 Dyslipidemia and Former Smoker. Acute respiratory failure with                  hypoxia , Pneumonia due to COVID-19 virus.  Sonographer:    [name] RDCS (AE) Referring Phys: [phone] [place]  [place] IMPRESSIONS  1. Left ventricular ejection fraction, by estimation, is 20 to 25%. The left ventricle has severely decreased function. The left ventricle demonstrates global hypokinesis. There is moderate left ventricular hypertrophy. Left ventricular diastolic parameters are consistent with Grade III diastolic dysfunction (restrictive).  2. Right ventricular systolic function is low normal. The right ventricular size is normal. Tricuspid regurgitation signal is inadequate for assessing PA pressure.  3. Left atrial size was mildly dilated.  4. The mitral valve is grossly normal. Mild mitral valve regurgitation.  5. The aortic valve was not well visualized. Aortic valve regurgitation is not visualized. Mild aortic valve sclerosis is present, with no evidence of aortic valve stenosis.  6. IVC dilated but respirophasic change not well visualized. FINDINGS  Left Ventricle: Left ventricular ejection fraction, by estimation, is 20 to 25%. The left ventricle has severely decreased function. The left ventricle demonstrates global hypokinesis. The left ventricular internal cavity size was normal in size. There is moderate left ventricular hypertrophy. Right Ventricle: The right ventricular size is normal. No increase in right ventricular wall thickness. Right ventricular systolic function is low normal. Tricuspid regurgitation signal is inadequate for assessing PA pressure. Left Atrium: Left atrial size was mildly dilated. Right Atrium: Right atrial size was normal in size. Pericardium: Trivial pericardial effusion is present. The pericardial effusion is circumferential. Mitral Valve: The mitral valve is grossly normal. Mild mitral valve regurgitation. Tricuspid Valve: The tricuspid valve is grossly normal. Tricuspid valve regurgitation is trivial. Aortic Valve: The aortic valve was not  well visualized. Aortic valve regurgitation is not visualized. Mild aortic valve sclerosis is present, with no evidence of aortic valve stenosis. Mild aortic valve annular calcification. Aorta: The aortic root is normal in size and structure. Venous: IVC dilated but respirophasic change not well visualized. IAS/Shunts: No atrial level shunt detected by color flow Doppler.  LEFT VENTRICLE PLAX 2D LVIDd:         5.17 cm  Diastology LVIDs:         4.67 cm  LV e' lateral:   4.57 cm/s LV PW:         1.62 cm  LV E/e' lateral: 22.3 LV IVS:        1.26 cm  LV e' medial:    4.79 cm/s LVOT diam:     2.10 cm  LV E/e' medial:  21.3 LV SV:         55 LV SV Index:   23 LVOT Area:     3.46 cm  LEFT ATRIUM           Index LA diam:      5.40 cm 2.22 cm/m LA Vol (A4C): 91.8 ml 37.73 ml/m  AORTIC VALVE LVOT Vmax:   79.00 cm/s LVOT Vmean:  56.000 cm/s LVOT VTI:    0.159 m  AORTA Ao Root diam: 3.00 cm MITRAL VALVE MV Area (PHT): 4.52 cm     SHUNTS MV Decel Time: 168 msec     Systemic VTI:  0.16 m MV E velocity: 102.00 cm/s  Systemic Diam: 2.10 cm MV A velocity: 36.00 cm/s MV E/A ratio:  2.83 Rozann Lesches MD Electronically signed by Rozann Lesches MD Signature Date/Time: 03/06/2020/12:59:22 PM    Final     Cardiac Studies     Patient Profile  Shane Chambers is a 59 y.o. male with a hx of DM2, HTN who is being seen today for the evaluation ofSOB and chest pain at the request of Dr Manuella Ghazi.  Assessment &  Plan   1. NSTEMI - atypical chest pain, however significant tropnonin elevation and EKG with new inferior ST depressions. Some prior lateral changes on old EKGs but inferior changes are new.  - echo LVEF 20-25%, grade III DDx, low normal RV function.  - of note 12/2019 echo LVEF 50-55% - bp's are stable   - medical therapy with  ASA 81, atorva 40, hep gtt, toprol 200. Ongoing atypical chest pain,  NG drip. He has ACEI allergy, maybe ARB in next few days though uptrend in Cr with diuresis thus far.  - will need  transfer for LHC/RHC   2. Acute systolic heart failure - echo LVEF 20-25%, grade III DDx, low normal RV function.  - of note 12/2019 echo LVEF 50-55%  - medical therapy with toprol 200 (home regimen). He has ACEI allergy, likely start ARB vs entresto after cath - negative 2L yesterday, negative 4.8 L since admission. Mild uptrend in renal function, he is on lasix 60mg  bid. Change to just once daily dosing today.  - since his ventricle took such an acute hit with drop in LV function will lower toprol temporarily to 100mg  daily. Pending RHC numbers could titrate back up.    3. Leukocytosis - per primary team   4. Drowsiness - drowsy this AM but arouseable, answers questions appropriately - received morphine last night around 8pm just 1 mg. BGs this AM was elevated.  -check ABG   Discussed with primary team arranging transfer to [facility] to medicine team for cath. Follow mental status into the morning, if ongoing issue would need to postpone cath.   I have reviewed the risks, indications, and alternatives to cardiac catheterization, possible angioplasty, and stenting with the patient  today. Risks include but are not limited to bleeding, infection, vascular injury, stroke, myocardial infection, arrhythmia, kidney injury, radiation-related injury in the case of prolonged fluoroscopy use, emergency cardiac surgery, and death. The patient understands the risks of serious complication is 1-2 in [id number] with diagnostic cardiac cath and 1-2% or less with angioplasty/stenting.   For questions or updates, please contact [place] Please consult [email or web address] for contact info under        Signed, Carlyle Dolly, MD  03/07/2020, 8:09 AM

## 2020-03-07 NOTE — H&P (View-Only) (Signed)
Progress Note  Patient Name: Shane Chambers Date of Encounter: 03/07/2020  Primary Cardiologist: New, Dr Harl Bowie  Subjective   SOB improving. Drowsy this AM  Inpatient Medications    Scheduled Meds: . aspirin EC  81 mg Oral Daily  . atorvastatin  40 mg Oral QHS  . Chlorhexidine Gluconate Cloth  6 each Topical Daily  . ferrous sulfate  325 mg Oral Q breakfast  . furosemide  60 mg Intravenous BID  . gabapentin  900 mg Oral BID  . insulin aspart  0-20 Units Subcutaneous Q4H  . insulin glargine  10 Units Subcutaneous BID  . metoprolol  200 mg Oral Daily  . sodium chloride flush  3 mL Intravenous Q12H  . tamsulosin  0.4 mg Oral Daily   Continuous Infusions: . heparin 1,700 Units/hr (03/07/20 0400)  . nitroGLYCERIN 5 mcg/min (03/07/20 0400)   PRN Meds: acetaminophen **OR** acetaminophen, albuterol, morphine injection, nitroGLYCERIN   Vital Signs    Vitals:   03/07/20 0530 03/07/20 0600 03/07/20 0630 03/07/20 0733  BP: 124/72 110/62 (!) 111/54   Pulse: (!) 25 (!) 136 92   Resp: 15 17 17 18   Temp:    (!) 97.2 F (36.2 C)  TempSrc:    Axillary  SpO2:      Weight:      Height:        Intake/Output Summary (Last 24 hours) at 03/07/2020 0809 Last data filed at 03/07/2020 0500 Gross per 24 hour  Intake 470.16 ml  Output 2475 ml  Net -2004.84 ml   Last 3 Weights 03/07/2020 03/06/2020 03/05/2020  Weight (lbs) 296 lb 4.8 oz 293 lb 14 oz 296 lb 1.2 oz  Weight (kg) 134.4 kg 133.3 kg 134.3 kg      Telemetry    SR - Personally Reviewed  ECG    n/a - Personally Reviewed  Physical Exam   GEN: No acute distress.   Neck: No JVD Cardiac: RRR, no murmurs, rubs, or gallops.  Respiratory: faint crackles bilateral bases GI: Soft, nontender, non-distended  MS: trace bilateral edema; No deformity. Neuro:  Nonfocal  Psych: Normal affect   Labs    High Sensitivity Troponin:   Recent Labs  Lab 03/06/20 1114 03/06/20 1347 03/06/20 1613 03/06/20 1800 03/06/20 2003    TROPONINIHS 3,835* 4,590* 4,752* 4,111* 3,519*      Chemistry Recent Labs  Lab 03/05/20 1330 03/06/20 0412 03/07/20 0412  NA 131* 133* 134*  K 4.5 4.0 3.4*  CL 94* 93* 93*  CO2 26 25 28   GLUCOSE 311* 424* 228*  BUN 22* 24* 36*  CREATININE 0.88 0.88 1.07  CALCIUM 9.2 9.7 9.4  PROT 7.4  --   --   ALBUMIN 3.7  --   --   AST 36  --   --   ALT 21  --   --   ALKPHOS 113  --   --   BILITOT 1.1  --   --   GFRNONAA >60 >60 >60  GFRAA >60 >60 >60  ANIONGAP 11 15 13      Hematology Recent Labs  Lab 03/05/20 1330 03/06/20 0412 03/07/20 0412  WBC 12.6* 10.7* 18.0*  RBC 3.89* 4.28 4.00*  HGB 11.1* 12.3* 11.3*  HCT 35.1* 38.1* 36.0*  MCV 90.2 89.0 90.0  MCH 28.5 28.7 28.3  MCHC 31.6 32.3 31.4  RDW 13.5 13.5 13.4  PLT 280 299 321    BNP Recent Labs  Lab 03/05/20 1330  BNP 463.0*  DDimer  Recent Labs  Lab 03/05/20 1330  DDIMER 1.08*     Radiology    CT Angio Chest PE W/Cm &/Or Wo Cm  Result Date: 03/05/2020 CLINICAL DATA:  Shortness of breath. EXAM: CT ANGIOGRAPHY CHEST WITH CONTRAST TECHNIQUE: Multidetector CT imaging of the chest was performed using the standard protocol during bolus administration of intravenous contrast. Multiplanar CT image reconstructions and MIPs were obtained to evaluate the vascular anatomy. CONTRAST:  111mL OMNIPAQUE IOHEXOL 350 MG/ML SOLN COMPARISON:  December 20, 2019. FINDINGS: Cardiovascular: Satisfactory opacification of the pulmonary arteries to the segmental level. No evidence of pulmonary embolism. Mild cardiomegaly. No pericardial effusion. Coronary artery calcifications are noted. Mediastinum/Nodes: Stable 1.5 cm anterior mediastinal lymph node is noted. Stable mildly enlarged right paratracheal and aortopulmonary window adenopathy is noted. This most likely is reactive in etiology. Lungs/Pleura: No pneumothorax is noted. Minimal bilateral pleural effusions are noted with adjacent subsegmental atelectasis. Mild right middle lobe  subsegmental atelectasis is noted. Upper Abdomen: No acute abnormality. Musculoskeletal: No chest wall abnormality. No acute or significant osseous findings. Review of the MIP images confirms the above findings. IMPRESSION: 1. No definite evidence of pulmonary embolus. 2. Coronary artery calcifications are noted suggesting coronary artery disease. 3. Minimal bilateral pleural effusions are noted with adjacent subsegmental atelectasis. Mild right middle lobe subsegmental atelectasis is noted. 4. Stable mediastinal adenopathy is noted which most likely is reactive in etiology. Electronically Signed   By: Marijo Conception M.D.   On: 03/05/2020 17:39   DG Chest Port 1 View  Result Date: 03/05/2020 CLINICAL DATA:  Shortness of breath. EXAM: PORTABLE CHEST 1 VIEW COMPARISON:  12/24/2019 FINDINGS: The heart is borderline enlarged. The mediastinal and hilar contours are within normal limits. Slightly prominent interstitial markings could suggest bronchitis. No definite infiltrates or effusions. No pulmonary lesions. IMPRESSION: Slightly prominent interstitial markings could suggest bronchitis. No definite infiltrates or effusions. Electronically Signed   By: Marijo Sanes M.D.   On: 03/05/2020 13:27   ECHOCARDIOGRAM LIMITED  Result Date: 03/06/2020    ECHOCARDIOGRAM LIMITED REPORT   Patient Name:   Shane Chambers Date of Exam: 03/06/2020 Medical Rec #:  [id number]       Height:       69.0 in Accession #:    [id number]      Weight:       293.9 lb Date of Birth:  1961-05-08       BSA:          2.433 m Patient Age:    59 years        BP:           122/85 mmHg Patient Gender: M               HR:           81 bpm. Exam Location:  [name] Procedure: Limited Echo Indications:    Elevated Troponin  History:        Patient has prior history of Echocardiogram examinations, most                 recent 12/21/2019. Risk Factors:Diabetes, Hypertension,                 Dyslipidemia and Former Smoker. Acute respiratory failure with                  hypoxia , Pneumonia due to COVID-19 virus.  Sonographer:    [name] RDCS (AE) Referring Phys: [phone] Imogene D  [facility] IMPRESSIONS  1. Left ventricular ejection fraction, by estimation, is 20 to 25%. The left ventricle has severely decreased function. The left ventricle demonstrates global hypokinesis. There is moderate left ventricular hypertrophy. Left ventricular diastolic parameters are consistent with Grade III diastolic dysfunction (restrictive).  2. Right ventricular systolic function is low normal. The right ventricular size is normal. Tricuspid regurgitation signal is inadequate for assessing PA pressure.  3. Left atrial size was mildly dilated.  4. The mitral valve is grossly normal. Mild mitral valve regurgitation.  5. The aortic valve was not well visualized. Aortic valve regurgitation is not visualized. Mild aortic valve sclerosis is present, with no evidence of aortic valve stenosis.  6. IVC dilated but respirophasic change not well visualized. FINDINGS  Left Ventricle: Left ventricular ejection fraction, by estimation, is 20 to 25%. The left ventricle has severely decreased function. The left ventricle demonstrates global hypokinesis. The left ventricular internal cavity size was normal in size. There is moderate left ventricular hypertrophy. Right Ventricle: The right ventricular size is normal. No increase in right ventricular wall thickness. Right ventricular systolic function is low normal. Tricuspid regurgitation signal is inadequate for assessing PA pressure. Left Atrium: Left atrial size was mildly dilated. Right Atrium: Right atrial size was normal in size. Pericardium: Trivial pericardial effusion is present. The pericardial effusion is circumferential. Mitral Valve: The mitral valve is grossly normal. Mild mitral valve regurgitation. Tricuspid Valve: The tricuspid valve is grossly normal. Tricuspid valve regurgitation is trivial. Aortic Valve: The aortic valve was not  well visualized. Aortic valve regurgitation is not visualized. Mild aortic valve sclerosis is present, with no evidence of aortic valve stenosis. Mild aortic valve annular calcification. Aorta: The aortic root is normal in size and structure. Venous: IVC dilated but respirophasic change not well visualized. IAS/Shunts: No atrial level shunt detected by color flow Doppler.  LEFT VENTRICLE PLAX 2D LVIDd:         5.17 cm  Diastology LVIDs:         4.67 cm  LV e' lateral:   4.57 cm/s LV PW:         1.62 cm  LV E/e' lateral: 22.3 LV IVS:        1.26 cm  LV e' medial:    4.79 cm/s LVOT diam:     2.10 cm  LV E/e' medial:  21.3 LV SV:         55 LV SV Index:   23 LVOT Area:     3.46 cm  LEFT ATRIUM           Index LA diam:      5.40 cm 2.22 cm/m LA Vol (A4C): 91.8 ml 37.73 ml/m  AORTIC VALVE LVOT Vmax:   79.00 cm/s LVOT Vmean:  56.000 cm/s LVOT VTI:    0.159 m  AORTA Ao Root diam: 3.00 cm MITRAL VALVE MV Area (PHT): 4.52 cm     SHUNTS MV Decel Time: 168 msec     Systemic VTI:  0.16 m MV E velocity: 102.00 cm/s  Systemic Diam: 2.10 cm MV A velocity: 36.00 cm/s MV E/A ratio:  2.83 Rozann Lesches MD Electronically signed by Rozann Lesches MD Signature Date/Time: 03/06/2020/12:59:22 PM    Final     Cardiac Studies     Patient Profile  KSHAUN GARGAN is a 59 y.o. male with a hx of DM2, HTN who is being seen today for the evaluation ofSOB and chest pain at the request of Dr Manuella Ghazi.  Assessment &  Plan   1. NSTEMI - atypical chest pain, however significant tropnonin elevation and EKG with new inferior ST depressions. Some prior lateral changes on old EKGs but inferior changes are new.  - echo LVEF 20-25%, grade III DDx, low normal RV function.  - of note 12/2019 echo LVEF 50-55% - bp's are stable   - medical therapy with  ASA 81, atorva 40, hep gtt, toprol 200. Ongoing atypical chest pain,  NG drip. He has ACEI allergy, maybe ARB in next few days though uptrend in Cr with diuresis thus far.  - will need  transfer for LHC/RHC   2. Acute systolic heart failure - echo LVEF 20-25%, grade III DDx, low normal RV function.  - of note 12/2019 echo LVEF 50-55%  - medical therapy with toprol 200 (home regimen). He has ACEI allergy, likely start ARB vs entresto after cath - negative 2L yesterday, negative 4.8 L since admission. Mild uptrend in renal function, he is on lasix 60mg  bid. Change to just once daily dosing today.  - since his ventricle took such an acute hit with drop in LV function will lower toprol temporarily to 100mg  daily. Pending RHC numbers could titrate back up.    3. Leukocytosis - per primary team   4. Drowsiness - drowsy this AM but arouseable, answers questions appropriately - received morphine last night around 8pm just 1 mg. BGs this AM was elevated.  -check ABG   Discussed with primary team arranging transfer to [facility] to medicine team for cath. Follow mental status into the morning, if ongoing issue would need to postpone cath.   I have reviewed the risks, indications, and alternatives to cardiac catheterization, possible angioplasty, and stenting with the patient  today. Risks include but are not limited to bleeding, infection, vascular injury, stroke, myocardial infection, arrhythmia, kidney injury, radiation-related injury in the case of prolonged fluoroscopy use, emergency cardiac surgery, and death. The patient understands the risks of serious complication is 1-2 in [id number] with diagnostic cardiac cath and 1-2% or less with angioplasty/stenting.   For questions or updates, please contact [place] Please consult [email or web address] for contact info under        Signed, Carlyle Dolly, MD  03/07/2020, 8:09 AM

## 2020-03-07 NOTE — Progress Notes (Signed)
Inpatient Diabetes Program Recommendations  AACE/ADA: New Consensus Statement on Inpatient Glycemic Control (2015)  Target Ranges:  Prepandial:   less than 140 mg/dL      Peak postprandial:   less than 180 mg/dL (1-2 hours)      Critically ill patients:  140 - 180 mg/dL   Lab Results  Component Value Date   GLUCAP 201 (H) 03/07/2020   HGBA1C 10.2 (H) 03/06/2020    Review of Glycemic Control Results for Shane Chambers, Shane Chambers (MRN [id number]) as of 03/07/2020 11:45  Ref. Range 03/07/2020 03:21 03/07/2020 07:32 03/07/2020 11:17  Glucose-Capillary Latest Ref Range: 70 - 99 mg/dL 217 (H) 186 (H) 201 (H)   Diabetes history: DM 2 Outpatient Diabetes medications: 70/30 65 units bid, Metformin 1000 mg bid Current orders for Inpatient glycemic control:  Lantus 10 units bid Novolog 0-20 units Q4 hours  Inpatient Diabetes Program Recommendations:    -  Increase Lantus to 12 units bid  -  Add Novolog meal coverage 4 units tid if eating >50% of meals.   Thanks,  Tama Headings RN, MSN, BC-ADM Inpatient Diabetes Coordinator Team Pager [phone] (8a-5p)

## 2020-03-07 NOTE — Progress Notes (Addendum)
PROGRESS NOTE    Shane Chambers  [id number] DOB: 05-07-1961 DOA: 03/05/2020 PCP: [name], MD   Brief Narrative:  Per HPI: Shane Chambers is a 59 y.o. male with medical history significant for type 2 diabetes mellitus complicated by peripheral polyneuropathy, hypertension, BPH, chronically elevated troponin, who is admitted to [facility] on 03/05/2020 with suspected acute on chronic diastolic heart failure after presenting from home to [facility] Emergency Department complaining of shortness of breath.   The patient reports 3 to 4 days of progressive shortness of breath associated with orthopnea and increased peripheral edema.  He denies any associated chest pain, palpitations, diaphoresis, nausea, vomiting, dizziness, presyncope, or syncope.  Denies any associated cough, hemoptysis, or subjective fever, chills, rigors, or generalized myalgias.  Denies any associated calf tenderness or erythema associate with the bilateral lower extremities.  Denies any recent trauma or travel.  Not associate with any recent diarrhea, melena, or hematochezia.  Denies any associated wheezing.  The patient reports that he does not have a scale at home, and is therefore unsure as to any recent change in body weight.  4/27: Patient noted to have significant troponin elevations up to 3895 with no chest pain or other symptoms noted.  Seen by cardiology this a.m. and is to continue heparin drip as well as nitroglycerin.  He is still not euvolemic and plans are to continue diuresis with increased dose of IV Lasix to 60 mg twice a day.  Patient will need cardiac catheterization eventually.  4/28: Some improvement in his chest discomfort after initiation of nitroglycerin drip.  Troponins continue to climb up without reaching plateau yet.  Following echo results plan is to transfer patient to [facility] for right and left heart cath.  Will follow further recommendations based on cath results by  cardiology service regarding optimization of his treatment.  Assessment & Plan:   Principal Problem:   Acute on chronic combined systolic and diastolic CHF (congestive heart failure) (HCC) Active Problems:   SOB (shortness of breath)   Hypertension   Type 2 diabetes mellitus (HCC)   Hyperlipidemia   BPH (benign prostatic hyperplasia)   Acute respiratory failure with hypoxia (HCC)   Acute hyponatremia   NSTEMI -Appreciate cardiology evaluation and recommendations; following abnormal echo results patient will be transferred to [facility] for left and right heart cath.  Will follow further recommendations and optimize medical management. -For now continue aspirin, statins, beta-blocker, heparin drip and nitroglycerin drip.  With eventual need for catheterization once euvolemic -Patient troponin upward trending; continue to monitor.  Acute hypoxemic respiratory failure secondary to acute on chronic combined systolic and diastolic CHF decompensation -Repeat 2D echocardiogram limited ordered and demonstrating grade 3 diastolic dysfunction with new decline in ejection fraction to 20-25%.  Global hypokinesis. -Continue daily weights, strict I's and O's and low-sodium diet. -We will continue IV Lasix -Due to decline in patient ejection fraction plan is for left and right heart cath later today. -Continue cardiology service recommendations and further adjustment to his management. -Currently good oxygen saturation on room air.  Uncontrolled/poorly controlled type 2 diabetes type 2 diabetes with hyperglycemia -A1c 10.1 -Monitor closely on carb modified diet -Continued on long-acting insulin 10 units twice daily -Continue sliding scale insulin. -Modified carbohydrate diet has been emphasized.  Essential hypertension-stable -Continue to monitor closely on nitroglycerin drip and Lasix diuresis -Continue metoprolol  Dyslipidemia -Continue home statin  BPH -Continue  tamsulosin  Class III morbid obesity Body mass index  is 43.76 kg/m. -Low calorie diet, portion control and increase physical activity has been discussed with patient. -Patient will benefit from referral to bariatric clinic.  DVT prophylaxis: Heparin drip Code Status: Full code Family Communication: None at bedside, discussed with patient Disposition Plan: Continue diuresis and follow recommendation by cardiology service will transfer to [facility] for left and right heart cath.  Patient has been started on heparin drip and nitroglycerin drip.   Consultants:   Cardiology  Procedures:   Limited 2D echocardiogram 4/27: With decreased ejection fraction by estimation to 20-25%.  Left ventricle severely decreased function appreciated.  Global hypokinesis is demonstrated.  Moderate left ventricular hypertrophy and grade 3 diastolic dysfunction in a restrictive pattern.  No significant valvular abnormalities appreciated.  Please refer to full report for further details.  Antimicrobials:   None   Subjective: Reports shortness of breath on exertion, anterior chest wall soreness radiated to the left; no fever, no nausea, no vomiting, expressed improvement in his ability to lay down flat.  Good urine output reported.  Good oxygen saturation on room air.  Objective: Vitals:   03/07/20 1700 03/07/20 1705 03/07/20 1720 03/07/20 1735  BP: (!) 162/71 (!) 162/71 (!) 132/110 (!) 145/107  Pulse: 61 70 63 69  Resp: 16 15 13 14   Temp:    98.6 F (37 C)  TempSrc:      SpO2: 95% 98% 100% 99%  Weight:      Height:        Intake/Output Summary (Last 24 hours) at 03/07/2020 2122 Last data filed at 03/07/2020 1800 Gross per 24 hour  Intake 202.6 ml  Output 3100 ml  Net -2897.4 ml   Filed Weights   03/05/20 2218 03/06/20 0711 03/07/20 0503  Weight: 134.3 kg 133.3 kg 134.4 kg    Examination: General exam: Alert, awake, oriented x 3; easily to arouse even sleepy/lethargic this  morning.  Patient expressed having a very rough night and due to ongoing anterior chest wall discomfort received a couple doses of morphine throughout the nighttime.  Still reporting some soreness in the anterior chest, radiated to the left side and left upper shoulder.  No nausea, no vomiting, no diaphoresis. Respiratory system: Distant breath sounds due to body habitus; decreased breath sounds at the bases, no frank crackles, no wheezing.  Good oxygen saturation on room air. Cardiovascular system:RRR. No rubs, no gallops.  Unable to properly assess JVD with body habitus. Gastrointestinal system: Abdomen is obese, nondistended, soft and nontender. No organomegaly or masses felt. Normal bowel sounds heard. Central nervous system: Alert and oriented. No focal neurological deficits. Extremities: No cyanosis or clubbing; trace edema bilaterally. Skin: No rashes, no petechiae. Psychiatry: Judgement and insight appear normal. Mood & affect appropriate.    Data Reviewed: I have personally reviewed following labs and imaging studies  CBC: Recent Labs  Lab 03/05/20 1330 03/06/20 0412 03/07/20 0412 03/07/20 1633  WBC 12.6* 10.7* 18.0*  --   NEUTROABS 9.2* 9.5*  --   --   HGB 11.1* 12.3* 11.3* 12.2*   12.6*  HCT 35.1* 38.1* 36.0* 36.0*   37.0*  MCV 90.2 89.0 90.0  --   PLT 280 299 321  --    Basic Metabolic Panel: Recent Labs  Lab 03/05/20 1330 03/05/20 1540 03/06/20 0412 03/07/20 0412 03/07/20 1633  NA 131*  --  133* 134* 137   138  K 4.5  --  4.0 3.4* 3.5   3.6  CL 94*  --  93*  93*  --   CO2 26  --  25 28  --   GLUCOSE 311*  --  424* 228*  --   BUN 22*  --  24* 36*  --   CREATININE 0.88  --  0.88 1.07  --   CALCIUM 9.2  --  9.7 9.4  --   MG  --  1.8 1.7 2.0  --   PHOS  --   --  5.6*  --   --    GFR: Estimated Creatinine Clearance: 103.6 mL/min (by C-G formula based on SCr of 1.07 mg/dL).   Liver Function Tests: Recent Labs  Lab 03/05/20 1330  AST 36  ALT 21  ALKPHOS 113   BILITOT 1.1  PROT 7.4  ALBUMIN 3.7   Coagulation Profile: Recent Labs  Lab 03/06/20 0412 03/06/20 0648  INR 1.1 1.1   HbA1C: Recent Labs    03/06/20 0909  HGBA1C 10.2*   CBG: Recent Labs  Lab 03/07/20 1117 03/07/20 1409 03/07/20 1542 03/07/20 1832 03/07/20 2103  GLUCAP 201* 193* 177* 192* 274*   Thyroid Function Tests: Recent Labs    03/05/20 1540  TSH 3.350    Recent Results (from the past 240 hour(s))  Respiratory Panel by RT PCR (Flu A&B, Covid) - Nasopharyngeal Swab     Status: None   Collection Time: 03/05/20  1:29 PM   Specimen: Nasopharyngeal Swab  Result Value Ref Range Status   SARS Coronavirus 2 by RT PCR NEGATIVE NEGATIVE Final    Comment: (NOTE) SARS-CoV-2 target nucleic acids are NOT DETECTED. The SARS-CoV-2 RNA is generally detectable in upper respiratoy specimens during the acute phase of infection. The lowest concentration of SARS-CoV-2 viral copies this assay can detect is 131 copies/mL. A negative result does not preclude SARS-Cov-2 infection and should not be used as the sole basis for treatment or other patient management decisions. A negative result may occur with  improper specimen collection/handling, submission of specimen other than nasopharyngeal swab, presence of viral mutation(s) within the areas targeted by this assay, and inadequate number of viral copies (<131 copies/mL). A negative result must be combined with clinical observations, patient history, and epidemiological information. The expected result is Negative. Fact Sheet for Patients:  PinkCheek.be Fact Sheet for Healthcare Providers:  GravelBags.it This test is not yet ap proved or cleared by the [place] FDA and  has been authorized for detection and/or diagnosis of SARS-CoV-2 by FDA under an Emergency Use Authorization (EUA). This EUA will remain  in effect (meaning this test can be used) for the duration  of the COVID-19 declaration under Section 564(b)(1) of the Act, 21 U.S.C. section 360bbb-3(b)(1), unless the authorization is terminated or revoked sooner.    Influenza A by PCR NEGATIVE NEGATIVE Final   Influenza B by PCR NEGATIVE NEGATIVE Final    Comment: (NOTE) The Xpert Xpress SARS-CoV-2/FLU/RSV assay is intended as an aid in  the diagnosis of influenza from Nasopharyngeal swab specimens and  should not be used as a sole basis for treatment. Nasal washings and  aspirates are unacceptable for Xpert Xpress SARS-CoV-2/FLU/RSV  testing. Fact Sheet for Patients: PinkCheek.be Fact Sheet for Healthcare Providers: GravelBags.it This test is not yet approved or cleared by the [place] FDA and  has been authorized for detection and/or diagnosis of SARS-CoV-2 by  FDA under an Emergency Use Authorization (EUA). This EUA will remain  in effect (meaning this test can be used) for the duration of the  Covid-19 declaration under Section  564(b)(1) of the Act, 21  U.S.C. section 360bbb-3(b)(1), unless the authorization is  terminated or revoked. Performed at [facility], [address], [place], [place] [address]   MRSA PCR Screening     Status: None   Collection Time: 03/06/20 12:36 AM   Specimen: Nasal Mucosa; Nasopharyngeal  Result Value Ref Range Status   MRSA by PCR NEGATIVE NEGATIVE Final    Comment:        The GeneXpert MRSA Assay (FDA approved for NASAL specimens only), is one component of a comprehensive MRSA colonization surveillance program. It is not intended to diagnose MRSA infection nor to guide or monitor treatment for MRSA infections. Performed at [facility], [address], [place], [place] [address]      Radiology Studies: CARDIAC CATHETERIZATION  Result Date: 03/07/2020  Prox RCA lesion is 99% stenosed.  Prox RCA to Mid RCA lesion is 40% stenosed.  Dist RCA lesion is 30% stenosed.  Prox LAD to  Mid LAD lesion is 80% stenosed.  1st Diag lesion is 60% stenosed.  Mid LM to Dist LM lesion is 40% stenosed.  Prox Cx to Mid Cx lesion is 95% stenosed.  1. 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 Moderately elevated LV/right heart filling pressures c/w known diagnosis of heart failure 3. Known severe LV dysfunction by echo Recommend: review revascularization options. Pt has surgical anatomy, but not sure he will be a candidate for CABG. High-risk multivessel PCI could be considered if he is not a candidate for surgery.   ECHOCARDIOGRAM LIMITED  Result Date: 03/06/2020    ECHOCARDIOGRAM LIMITED REPORT   Patient Name:   Shane Chambers Date of Exam: 03/06/2020 Medical Rec #:  [id number]       Height:       69.0 in Accession #:    [id number]      Weight:       293.9 lb Date of Birth:  Mar 01, 1961       BSA:          2.433 m Patient Age:    17 years        BP:           122/85 mmHg Patient Gender: M               HR:           81 bpm. Exam Location:  [name] Procedure: Limited Echo Indications:    Elevated Troponin  History:        Patient has prior history of Echocardiogram examinations, most                 recent 12/21/2019. Risk Factors:Diabetes, Hypertension,                 Dyslipidemia and Former Smoker. Acute respiratory failure with                 hypoxia , Pneumonia due to COVID-19 virus.  Sonographer:    [name] RDCS (AE) Referring Phys: [phone] Royanne Foots [place]  1. Left ventricular ejection fraction, by estimation, is 20 to 25%. The left ventricle has severely decreased function. The left ventricle demonstrates global hypokinesis. There is moderate left ventricular hypertrophy. Left ventricular diastolic parameters are consistent with Grade III diastolic dysfunction (restrictive).  2. Right ventricular systolic function is low normal. The right  ventricular size is normal.  Tricuspid regurgitation signal is inadequate for assessing PA pressure.  3. Left atrial size was mildly dilated.  4. The mitral valve is grossly normal. Mild mitral valve regurgitation.  5. The aortic valve was not well visualized. Aortic valve regurgitation is not visualized. Mild aortic valve sclerosis is present, with no evidence of aortic valve stenosis.  6. IVC dilated but respirophasic change not well visualized. FINDINGS  Left Ventricle: Left ventricular ejection fraction, by estimation, is 20 to 25%. The left ventricle has severely decreased function. The left ventricle demonstrates global hypokinesis. The left ventricular internal cavity size was normal in size. There is moderate left ventricular hypertrophy. Right Ventricle: The right ventricular size is normal. No increase in right ventricular wall thickness. Right ventricular systolic function is low normal. Tricuspid regurgitation signal is inadequate for assessing PA pressure. Left Atrium: Left atrial size was mildly dilated. Right Atrium: Right atrial size was normal in size. Pericardium: Trivial pericardial effusion is present. The pericardial effusion is circumferential. Mitral Valve: The mitral valve is grossly normal. Mild mitral valve regurgitation. Tricuspid Valve: The tricuspid valve is grossly normal. Tricuspid valve regurgitation is trivial. Aortic Valve: The aortic valve was not well visualized. Aortic valve regurgitation is not visualized. Mild aortic valve sclerosis is present, with no evidence of aortic valve stenosis. Mild aortic valve annular calcification. Aorta: The aortic root is normal in size and structure. Venous: IVC dilated but respirophasic change not well visualized. IAS/Shunts: No atrial level shunt detected by color flow Doppler.  LEFT VENTRICLE PLAX 2D LVIDd:         5.17 cm  Diastology LVIDs:         4.67 cm  LV e' lateral:   4.57 cm/s LV PW:         1.62 cm  LV E/e' lateral: 22.3 LV IVS:         1.26 cm  LV e' medial:    4.79 cm/s LVOT diam:     2.10 cm  LV E/e' medial:  21.3 LV SV:         55 LV SV Index:   23 LVOT Area:     3.46 cm  LEFT ATRIUM           Index LA diam:      5.40 cm 2.22 cm/m LA Vol (A4C): 91.8 ml 37.73 ml/m  AORTIC VALVE LVOT Vmax:   79.00 cm/s LVOT Vmean:  56.000 cm/s LVOT VTI:    0.159 m  AORTA Ao Root diam: 3.00 cm MITRAL VALVE MV Area (PHT): 4.52 cm     SHUNTS MV Decel Time: 168 msec     Systemic VTI:  0.16 m MV E velocity: 102.00 cm/s  Systemic Diam: 2.10 cm MV A velocity: 36.00 cm/s MV E/A ratio:  2.83 Rozann Lesches MD Electronically signed by Rozann Lesches MD Signature Date/Time: 03/06/2020/12:59:22 PM    Final     Scheduled Meds:  aspirin EC  81 mg Oral Daily   atorvastatin  40 mg Oral QHS   Chlorhexidine Gluconate Cloth  6 each Topical Daily   ferrous sulfate  325 mg Oral Q breakfast   furosemide  60 mg Intravenous BID   gabapentin  900 mg Oral BID   insulin aspart  0-20 Units Subcutaneous Q4H   insulin glargine  10 Units Subcutaneous BID   metoprolol  100 mg Oral Daily   sodium chloride flush  3 mL Intravenous Q12H   sodium chloride flush  3 mL Intravenous Q12H   sodium  chloride flush  3 mL Intravenous Q12H   tamsulosin  0.4 mg Oral Daily   Continuous Infusions:  sodium chloride     heparin     nitroGLYCERIN 5 mcg/min (03/07/20 0400)     LOS: 2 days    Time spent: 35 minutes    Barton Dubois, MD [facility]  If 7PM-7AM, please contact night-coverage [email or web address] 03/07/2020, 9:22 PM

## 2020-03-07 NOTE — TOC Initial Note (Signed)
Transition of Care [facility]) - Initial/Assessment Note    Patient Details  Name: Shane Chambers MRN: [id number] Date of Birth: 08-08-1961  [facility] CM/SW Contact:    Shade Flood, LCSW Phone Number: 03/07/2020, 10:40 AM  Clinical Narrative:                  Pt admitted from home. Pt known to TOC from previous admissions. Patient lives alone, with help from his mom and sister in regards to cleaning and cooking. He does report having to rely on his mom heavily, stating she also empties his BSC.  During pt's last admission, he was adamant about not going to SNF and was reluctant to [facility] therapy due to the condition of his home.  Last admission, TOC discussed options for a family care home. Patient was not interested in moving and adamant about not going to a [facility], and stated he doesn't like to live with other people.   Per MD, anticipating pt will transfer to [facility] for heart cath when stable.   TOC will follow and assist as needed.   Expected Discharge Plan: Home/Self Care Barriers to Discharge: Continued Medical Work up   Patient Goals and CMS Choice        Expected Discharge Plan and Services Expected Discharge Plan: Home/Self Care In-house Referral: Clinical Social Work     Living arrangements for the past 2 months: Single Family Home                                      Prior Living Arrangements/Services Living arrangements for the past 2 months: Single Family Home Lives with:: Parents Patient language and need for interpreter reviewed:: Yes Do you feel safe going back to the place where you live?: Yes      Need for Family Participation in Patient Care: Yes (Comment) Care giver support system in place?: Yes (comment) Current home services: DME Criminal Activity/Legal Involvement Pertinent to Current Situation/Hospitalization: No - Comment as needed  Activities of Daily Living Home Assistive Devices/Equipment: Bedside commode/3-in-1, CBG  Meter, Wheelchair, Environmental consultant (specify type) ADL Screening (condition at time of admission) Patient's cognitive ability adequate to safely complete daily activities?: Yes Is the patient deaf or have difficulty hearing?: No Does the patient have difficulty seeing, even when wearing glasses/contacts?: No Does the patient have difficulty concentrating, remembering, or making decisions?: No Patient able to express need for assistance with ADLs?: Yes Does the patient have difficulty dressing or bathing?: Yes Independently performs ADLs?: No Communication: Independent Dressing (OT): Needs assistance Is this a change from baseline?: Pre-admission baseline Grooming: Independent Feeding: Independent Bathing: Needs assistance Is this a change from baseline?: Pre-admission baseline Toileting: Needs assistance Is this a change from baseline?: Pre-admission baseline In/Out Bed: Needs assistance Is this a change from baseline?: Pre-admission baseline Walks in Home: Needs assistance Is this a change from baseline?: Pre-admission baseline Does the patient have difficulty walking or climbing stairs?: Yes Weakness of Legs: Both Weakness of Arms/Hands: None  Permission Sought/Granted                  Emotional Assessment       Orientation: : Oriented to Self, Oriented to Place, Oriented to  Time, Oriented to Situation Alcohol / Substance Use: Not Applicable Psych Involvement: No (comment)  Admission diagnosis:  Acute on chronic diastolic heart failure (HCC) [I50.33] Acute respiratory failure with hypoxia ([place]) [  J96.01] Patient Active Problem List   Diagnosis Date Noted  . Acute on chronic diastolic heart failure ([place]) 03/05/2020  . Acute hyponatremia 03/05/2020  . Acute respiratory failure with hypoxia ([place]) 12/21/2019  . Acute respiratory failure ([place]) 12/20/2019  . Severe Vitamin D deficiency 12/02/2019  . CAP (community acquired pneumonia) 12/01/2019  . Hypokalemia 12/01/2019  .  Hyperlipidemia 12/01/2019  . BPH (benign prostatic hyperplasia) 12/01/2019  . Normocytic anemia 12/01/2019  . Pneumonia due to COVID-19 virus 12/01/2019  . Hypoxia   . SOB (shortness of breath) 11/16/2019  . Sepsis ([place]) 11/16/2019  . Hypertension   . Type 2 diabetes mellitus ([place])   . Depression   . Acute bronchitis with bronchospasm 11/15/2019   PCP:  [name], MD Pharmacy:   [id number] - [place], [place] [id number] [place] [id number] Phone: [phone] Fax: [phone]  [id number] - [place], [place] [address] [place] [address] Phone: [phone] Fax: [phone]     Social Determinants of Health (SDOH) Interventions    Readmission Risk Interventions Readmission Risk Prevention Plan 03/07/2020 12/06/2019  Transportation Screening Complete Complete  PCP or Specialist Appt within 3-5 Days - Not Complete  HRI or [place] - Not Complete  Social Work Consult for [place] Planning/Counseling - Not Complete  Palliative Care Screening - Not Complete  Medication Review Press photographer) Complete Complete  Some recent data might be hidden

## 2020-03-07 NOTE — Interval H&P Note (Signed)
Cath Lab Visit (complete for each Cath Lab visit)  Clinical Evaluation Leading to the Procedure:   ACS: Yes.    Non-ACS:    Anginal Classification: CCS IV  Anti-ischemic medical therapy: Minimal Therapy (1 class of medications)  Non-Invasive Test Results: No non-invasive testing performed  Prior CABG: No previous CABG      History and Physical Interval Note:  03/07/2020 3:57 PM  Shane Chambers  has presented today for surgery, with the diagnosis of nstemi.  The various methods of treatment have been discussed with the patient and family. After consideration of risks, benefits and other options for treatment, the patient has consented to  Procedure(s): RIGHT/LEFT HEART CATH AND CORONARY ANGIOGRAPHY (N/A) as a surgical intervention.  The patient's history has been reviewed, patient examined, no change in status, stable for surgery.  I have reviewed the patient's chart and labs.  Questions were answered to the patient's satisfaction.     Sherren Mocha

## 2020-03-07 NOTE — Progress Notes (Signed)
[place] for Heparin Indication: chest pain/ACS  Allergies  Allergen Reactions  . Ace Inhibitors Swelling and Cough  . Other Itching    [name]  . Penicillins     Has patient had a PCN reaction causing immediate rash, facial/tongue/throat swelling, SOB or lightheadedness with hypotension: Unknown Has patient had a PCN reaction causing severe rash involving mucus membranes or skin necrosis: Unknown Has patient had a PCN reaction that required hospitalization: Unknown Has patient had a PCN reaction occurring within the last 10 years: Unknown If all of the above answers are "NO", then may proceed with Cephalosporin use.     Patient Measurements: Height: 5\' 9"  (175.3 cm) Weight: 134.4 kg (296 lb 4.8 oz) IBW/kg (Calculated) : 70.7 Heparin Dosing Weight: 102 kg  Vital Signs: Temp: 98.6 F (37 C) (04/28 1735) Temp Source: Oral (04/28 1118) BP: 145/107 (04/28 1735) Pulse Rate: 69 (04/28 1735)  Labs: Recent Labs    03/05/20 1330 03/05/20 1540 03/06/20 0412 03/06/20 0412 03/06/20 0648 03/06/20 0909 03/06/20 1347 03/06/20 1500 03/06/20 1613 03/06/20 1800 03/06/20 2003 03/06/20 2204 03/07/20 0412 03/07/20 1633  HGB 11.1*   < > 12.3*   < >  --   --   --   --   --   --   --   --  11.3* 12.2*  12.6*  HCT 35.1*   < > 38.1*  --   --   --   --   --   --   --   --   --  36.0* 36.0*  37.0*  PLT 280  --  299  --   --   --   --   --   --   --   --   --  321  --   APTT  --   --   --   --  30  --   --   --   --   --   --   --   --   --   LABPROT  --   --  14.3  --  14.1  --   --   --   --   --   --   --   --   --   INR  --   --  1.1  --  1.1  --   --   --   --   --   --   --   --   --   HEPARINUNFRC  --   --   --   --   --   --   --  0.16*  --   --   --  0.41 0.45  --   CREATININE 0.88  --  0.88  --   --   --   --   --   --   --   --   --  1.07  --   TROPONINIHS 81*   < > 690*  --   --    < >   < >  --  4,752* 4,111* 3,519*  --   --   --     < > = values in this interval not displayed.    Estimated Creatinine Clearance: 103.6 mL/min (by C-G formula based on SCr of 1.07 mg/dL).   Medical History: Past Medical History:  Diagnosis Date  . Depression   . Diabetes mellitus without complication ([place])   .  Hyperlipidemia 12/01/2019  . Hypertension   . Type 2 diabetes mellitus ([facility])     Medications:  Medications Prior to Admission  Medication Sig Dispense Refill Last Dose  . albuterol (VENTOLIN HFA) 108 (90 Base) MCG/ACT inhaler Inhale 1-2 puffs into the lungs every 6 (six) hours as needed for wheezing or shortness of breath.    03/04/2020  . amLODipine (NORVASC) 10 MG tablet Take 10 mg by mouth daily.   03/03/2020  . aspirin 81 MG EC tablet Take 81 mg by mouth daily.    03/04/2020 at 0000  . atorvastatin (LIPITOR) 40 MG tablet Take 40 mg by mouth daily.   03/03/2020  . calcium carbonate (OS-CAL - DOSED IN MG OF ELEMENTAL CALCIUM) 1250 (500 Ca) MG tablet Take 1 tablet by mouth daily.   Past Week at Unknown time  . chlorthalidone (HYGROTON) 25 MG tablet Take 25 mg by mouth daily.   03/03/2020  . CINNAMON PO Take 1 capsule by mouth daily.   Past Week at Unknown time  . docusate sodium (COLACE) 100 MG capsule Take 1 capsule (100 mg total) by mouth 2 (two) times daily. (Patient taking differently: Take 100 mg by mouth 2 (two) times daily as needed. ) 10 capsule 0   . fluticasone (VERAMYST) 27.5 MCG/SPRAY nasal spray Place 2 sprays into the nose daily as needed for rhinitis or allergies.    03/03/2020  . furosemide (LASIX) 20 MG tablet Take 1 tablet (20 mg total) by mouth daily. Take an extra 20mg  by mouth if noted to have greater than 3 lb weight gain in 24 hours. (Patient taking differently: Take 20 mg by mouth 2 (two) times daily as needed. Take an extra 20mg  by mouth if noted to have greater than 3 lb weight gain in 24 hours.) 30 tablet 11 03/03/2020  . gabapentin (NEURONTIN) 300 MG capsule Take 900 mg by mouth 2 (two) times daily.     03/03/2020  . indomethacin (INDOCIN) 25 MG capsule Take 25 mg by mouth 2 (two) times daily as needed for mild pain or moderate pain.      Marland Kitchen insulin aspart protamine- aspart (NOVOLOG MIX 70/30) (70-30) 100 UNIT/ML injection Inject 0.55 mLs (55 Units total) into the skin 2 (two) times daily with a meal. (Patient taking differently: Inject 65 Units into the skin 2 (two) times daily with a meal. ) 10 mL 11 03/04/2020 at 0000  . Iron, Ferrous Sulfate, 325 (65 Fe) MG TABS Take 1 tablet by mouth daily. 30 tablet 1 03/03/2020  . MAGNESIUM PO Take 1 tablet by mouth daily.   Past Week at Unknown time  . metFORMIN (GLUCOPHAGE) 1000 MG tablet Take 1,000 mg by mouth 2 (two) times daily with a meal.   03/04/2020 at 0000  . metoprolol (TOPROL-XL) 200 MG 24 hr tablet Take 200 mg by mouth daily.   03/04/2020 at 0000  . Multiple Vitamins-Minerals (TAB-A-VITE) TABS Take 1 tablet by mouth daily.   03/03/2020  . omeprazole (PRILOSEC) 40 MG capsule Take 40 mg by mouth daily.   03/03/2020  . polyethylene glycol (MIRALAX / GLYCOLAX) 17 g packet Take 17 g by mouth daily as needed for mild constipation. 14 each 0   . potassium chloride (KLOR-CON) 10 MEQ tablet Take 1 tablet (10 mEq total) by mouth daily. 30 tablet 2 03/03/2020  . tamsulosin (FLOMAX) 0.4 MG CAPS capsule Take 0.4 mg by mouth daily.   03/03/2020  . venlafaxine XR (EFFEXOR-XR) 150 MG 24 hr capsule Take 150  mg by mouth daily with breakfast.   03/03/2020    Assessment: 59 y/o M admitted with ShOB and chest pain. Pharmacy consulted to initiate heparin infusion. Cardiology consult ordered and diagnosed with NSTEMI. Transferred to [facility] for right heart cath. Found to have multivessel CAD, will be evaluated for surgery. Continue heparin drip post cath.  Patient had been therapeutic on a drip at 1700 units/hour will restart drip at this rate.  Goal of Therapy:  Heparin level 0.3-0.7 units/ml Monitor platelets by anticoagulation protocol: Yes   Plan:  Restart heparin  infusion at 1700 units/hr. Check anti-Xa level daily while on heparin. Continue to monitor H&H and platelets.  Alanda Slim, PharmD, [facility] Clinical Pharmacist Please see AMION for all Pharmacists' Contact Phone Numbers 03/07/2020, 8:14 PM

## 2020-03-07 NOTE — Plan of Care (Signed)
Pt transferred to [facility] cath lab via [place]. VSS, chest pain free, blood sugar 193.

## 2020-03-07 NOTE — Progress Notes (Signed)
[place] for Heparin Indication: chest pain/ACS  Allergies  Allergen Reactions  . Ace Inhibitors Swelling and Cough  . Other Itching    [name]  . Penicillins     Has patient had a PCN reaction causing immediate rash, facial/tongue/throat swelling, SOB or lightheadedness with hypotension: Unknown Has patient had a PCN reaction causing severe rash involving mucus membranes or skin necrosis: Unknown Has patient had a PCN reaction that required hospitalization: Unknown Has patient had a PCN reaction occurring within the last 10 years: Unknown If all of the above answers are "NO", then may proceed with Cephalosporin use.     Patient Measurements: Height: 5\' 9"  (175.3 cm) Weight: 134.4 kg (296 lb 4.8 oz) IBW/kg (Calculated) : 70.7 Heparin Dosing Weight: 102 kg  Vital Signs: Temp: 97.2 F (36.2 C) (04/28 0733) Temp Source: Axillary (04/28 0733) BP: 111/54 (04/28 0630) Pulse Rate: 92 (04/28 0630)  Labs: Recent Labs    03/05/20 1330 03/05/20 1540 03/06/20 0412 03/06/20 0648 03/06/20 0909 03/06/20 1347 03/06/20 1500 03/06/20 1613 03/06/20 1800 03/06/20 2003 03/06/20 2204 03/07/20 0412  HGB 11.1*   < > 12.3*  --   --   --   --   --   --   --   --  11.3*  HCT 35.1*  --  38.1*  --   --   --   --   --   --   --   --  36.0*  PLT 280  --  299  --   --   --   --   --   --   --   --  321  APTT  --   --   --  30  --   --   --   --   --   --   --   --   LABPROT  --   --  14.3 14.1  --   --   --   --   --   --   --   --   INR  --   --  1.1 1.1  --   --   --   --   --   --   --   --   HEPARINUNFRC  --   --   --   --   --   --  0.16*  --   --   --  0.41 0.45  CREATININE 0.88  --  0.88  --   --   --   --   --   --   --   --  1.07  TROPONINIHS 81*   < > 690*  --    < >   < >  --  4,752* 4,111* 3,519*  --   --    < > = values in this interval not displayed.    Estimated Creatinine Clearance: 103.6 mL/min (by C-G formula based on SCr of 1.07  mg/dL).   Medical History: Past Medical History:  Diagnosis Date  . Depression   . Diabetes mellitus without complication ([place])   . Hyperlipidemia 12/01/2019  . Hypertension   . Type 2 diabetes mellitus (HCC)     Medications:  Medications Prior to Admission  Medication Sig Dispense Refill Last Dose  . albuterol (VENTOLIN HFA) 108 (90 Base) MCG/ACT inhaler Inhale 1-2 puffs into the lungs every 6 (six) hours as needed for wheezing or shortness of breath.  03/04/2020  . amLODipine (NORVASC) 10 MG tablet Take 10 mg by mouth daily.   03/03/2020  . aspirin 81 MG EC tablet Take 81 mg by mouth daily.    03/04/2020 at 0000  . atorvastatin (LIPITOR) 40 MG tablet Take 40 mg by mouth daily.   03/03/2020  . calcium carbonate (OS-CAL - DOSED IN MG OF ELEMENTAL CALCIUM) 1250 (500 Ca) MG tablet Take 1 tablet by mouth daily.   Past Week at Unknown time  . chlorthalidone (HYGROTON) 25 MG tablet Take 25 mg by mouth daily.   03/03/2020  . CINNAMON PO Take 1 capsule by mouth daily.   Past Week at Unknown time  . docusate sodium (COLACE) 100 MG capsule Take 1 capsule (100 mg total) by mouth 2 (two) times daily. (Patient taking differently: Take 100 mg by mouth 2 (two) times daily as needed. ) 10 capsule 0   . fluticasone (VERAMYST) 27.5 MCG/SPRAY nasal spray Place 2 sprays into the nose daily as needed for rhinitis or allergies.    03/03/2020  . furosemide (LASIX) 20 MG tablet Take 1 tablet (20 mg total) by mouth daily. Take an extra 20mg  by mouth if noted to have greater than 3 lb weight gain in 24 hours. (Patient taking differently: Take 20 mg by mouth 2 (two) times daily as needed. Take an extra 20mg  by mouth if noted to have greater than 3 lb weight gain in 24 hours.) 30 tablet 11 03/03/2020  . gabapentin (NEURONTIN) 300 MG capsule Take 900 mg by mouth 2 (two) times daily.    03/03/2020  . indomethacin (INDOCIN) 25 MG capsule Take 25 mg by mouth 2 (two) times daily as needed for mild pain or moderate pain.      Marland Kitchen  insulin aspart protamine- aspart (NOVOLOG MIX 70/30) (70-30) 100 UNIT/ML injection Inject 0.55 mLs (55 Units total) into the skin 2 (two) times daily with a meal. (Patient taking differently: Inject 65 Units into the skin 2 (two) times daily with a meal. ) 10 mL 11 03/04/2020 at 0000  . Iron, Ferrous Sulfate, 325 (65 Fe) MG TABS Take 1 tablet by mouth daily. 30 tablet 1 03/03/2020  . MAGNESIUM PO Take 1 tablet by mouth daily.   Past Week at Unknown time  . metFORMIN (GLUCOPHAGE) 1000 MG tablet Take 1,000 mg by mouth 2 (two) times daily with a meal.   03/04/2020 at 0000  . metoprolol (TOPROL-XL) 200 MG 24 hr tablet Take 200 mg by mouth daily.   03/04/2020 at 0000  . Multiple Vitamins-Minerals (TAB-A-VITE) TABS Take 1 tablet by mouth daily.   03/03/2020  . omeprazole (PRILOSEC) 40 MG capsule Take 40 mg by mouth daily.   03/03/2020  . polyethylene glycol (MIRALAX / GLYCOLAX) 17 g packet Take 17 g by mouth daily as needed for mild constipation. 14 each 0   . potassium chloride (KLOR-CON) 10 MEQ tablet Take 1 tablet (10 mEq total) by mouth daily. 30 tablet 2 03/03/2020  . tamsulosin (FLOMAX) 0.4 MG CAPS capsule Take 0.4 mg by mouth daily.   03/03/2020  . venlafaxine XR (EFFEXOR-XR) 150 MG 24 hr capsule Take 150 mg by mouth daily with breakfast.   03/03/2020    Assessment: 59 y/o M admitted with ShOB and chest pain. Pharmacy consulted to initiate heparin infusion. Cardiology consult ordered and diagnosed with NSTEMI.  HL 0.45: therapeutic  Goal of Therapy:  Heparin level 0.3-0.7 units/ml Monitor platelets by anticoagulation protocol: Yes   Plan:  Continue heparin infusion  at 1700 units/hr. Check anti-Xa level daily while on heparin. Continue to monitor H&H and platelets.  Margot Ables, PharmD Clinical Pharmacist 03/07/2020 8:03 AM

## 2020-03-07 NOTE — Progress Notes (Signed)
Patient arrived to cath lab holding bed 6. Report received from EMS. Patient is A/O x4 but is a little lethargic. This is no change from [place]. Patient answers all questions appropriately. VSS. IV patent with Nitroglycerin and Heparin infusing. 2nd IV obtained. Orders reviewed and consent obtained. Will continue to monitor.Cindee Salt

## 2020-03-08 ENCOUNTER — Other Ambulatory Visit: Payer: Self-pay | Admitting: *Deleted

## 2020-03-08 DIAGNOSIS — I255 Ischemic cardiomyopathy: Secondary | ICD-10-CM | POA: Diagnosis not present

## 2020-03-08 DIAGNOSIS — I25118 Atherosclerotic heart disease of native coronary artery with other forms of angina pectoris: Secondary | ICD-10-CM | POA: Diagnosis not present

## 2020-03-08 DIAGNOSIS — R0602 Shortness of breath: Secondary | ICD-10-CM

## 2020-03-08 DIAGNOSIS — I2510 Atherosclerotic heart disease of native coronary artery without angina pectoris: Secondary | ICD-10-CM

## 2020-03-08 DIAGNOSIS — I5043 Acute on chronic combined systolic (congestive) and diastolic (congestive) heart failure: Secondary | ICD-10-CM | POA: Diagnosis not present

## 2020-03-08 LAB — LIPID PANEL
Cholesterol: 157 mg/dL (ref 0–200)
HDL: 36 mg/dL — ABNORMAL LOW (ref >40)
LDL Cholesterol: 92 mg/dL (ref 0–99)
Total CHOL/HDL Ratio: 4.4 RATIO
Triglycerides: 144 mg/dL (ref <150)
VLDL: 29 mg/dL (ref 0–40)

## 2020-03-08 LAB — HEPARIN LEVEL (UNFRACTIONATED)
Heparin Unfractionated: 0.13 IU/mL — ABNORMAL LOW (ref 0.30–0.70)
Heparin Unfractionated: 0.4 IU/mL (ref 0.30–0.70)
Heparin Unfractionated: 0.56 IU/mL (ref 0.30–0.70)

## 2020-03-08 LAB — BASIC METABOLIC PANEL
Anion gap: 9 (ref 5–15)
BUN: 24 mg/dL — ABNORMAL HIGH (ref 6–20)
CO2: 30 mmol/L (ref 22–32)
Calcium: 9.5 mg/dL (ref 8.9–10.3)
Chloride: 99 mmol/L (ref 98–111)
Creatinine, Ser: 0.99 mg/dL (ref 0.61–1.24)
GFR calc Af Amer: >60 mL/min (ref >60)
GFR calc non Af Amer: >60 mL/min (ref >60)
Glucose, Bld: 180 mg/dL — ABNORMAL HIGH (ref 70–99)
Potassium: 3.8 mmol/L (ref 3.5–5.1)
Sodium: 138 mmol/L (ref 135–145)

## 2020-03-08 LAB — CBC
HCT: 37 % — ABNORMAL LOW (ref 39.0–52.0)
Hemoglobin: 11.9 g/dL — ABNORMAL LOW (ref 13.0–17.0)
MCH: 29.1 pg (ref 26.0–34.0)
MCHC: 32.2 g/dL (ref 30.0–36.0)
MCV: 90.5 fL (ref 80.0–100.0)
Platelets: 325 10*3/uL (ref 150–400)
RBC: 4.09 MIL/uL — ABNORMAL LOW (ref 4.22–5.81)
RDW: 13.5 % (ref 11.5–15.5)
WBC: 13.6 10*3/uL — ABNORMAL HIGH (ref 4.0–10.5)
nRBC: 0 % (ref 0.0–0.2)

## 2020-03-08 LAB — GLUCOSE, CAPILLARY
Glucose-Capillary: 185 mg/dL — ABNORMAL HIGH (ref 70–99)
Glucose-Capillary: 227 mg/dL — ABNORMAL HIGH (ref 70–99)
Glucose-Capillary: 236 mg/dL — ABNORMAL HIGH (ref 70–99)
Glucose-Capillary: 273 mg/dL — ABNORMAL HIGH (ref 70–99)
Glucose-Capillary: 277 mg/dL — ABNORMAL HIGH (ref 70–99)
Glucose-Capillary: 309 mg/dL — ABNORMAL HIGH (ref 70–99)
Glucose-Capillary: 335 mg/dL — ABNORMAL HIGH (ref 70–99)

## 2020-03-08 MED ORDER — HEART ATTACK BOUNCING BOOK
Freq: Once | Status: AC
  Filled 2020-03-08: qty 1

## 2020-03-08 MED ORDER — THE SENSUOUS HEART BOOK
Freq: Once | Status: AC
  Filled 2020-03-08: qty 1

## 2020-03-08 MED ORDER — SENNOSIDES-DOCUSATE SODIUM 8.6-50 MG PO TABS: 2.0000 | ORAL_TABLET | Freq: Every evening | ORAL | Status: DC | PRN

## 2020-03-08 MED ORDER — INSULIN GLARGINE 100 UNIT/ML ~~LOC~~ SOLN
14.0000 [IU] | Freq: Two times a day (BID) | SUBCUTANEOUS | Status: DC
  Administered 2020-03-08 – 2020-03-09 (×3): 14 [IU] via SUBCUTANEOUS
  Filled 2020-03-08 (×5): qty 0.14

## 2020-03-08 MED ORDER — ACETAMINOPHEN 325 MG PO TABS
650.0000 mg | ORAL_TABLET | Freq: Four times a day (QID) | ORAL | Status: DC | PRN
  Administered 2020-03-08 – 2020-03-12 (×6): 650 mg via ORAL
  Filled 2020-03-08 (×6): qty 2

## 2020-03-08 MED ORDER — SPIRONOLACTONE 25 MG PO TABS
25.0000 mg | ORAL_TABLET | Freq: Every day | ORAL | Status: DC
  Administered 2020-03-08 – 2020-03-10 (×3): 25 mg via ORAL
  Filled 2020-03-08 (×3): qty 1

## 2020-03-08 MED ORDER — POLYETHYLENE GLYCOL 3350 17 G PO PACK
17.0000 g | PACK | Freq: Every day | ORAL | Status: DC | PRN
  Administered 2020-03-09: 17 g via ORAL
  Filled 2020-03-08: qty 1

## 2020-03-08 MED ORDER — ANGIOPLASTY BOOK
Freq: Once | Status: AC
  Filled 2020-03-08: qty 1

## 2020-03-08 MED ORDER — ATORVASTATIN CALCIUM 80 MG PO TABS
80.0000 mg | ORAL_TABLET | Freq: Every day | ORAL | Status: DC
  Administered 2020-03-08 – 2020-03-18 (×10): 80 mg via ORAL
  Filled 2020-03-08 (×11): qty 1

## 2020-03-08 NOTE — Progress Notes (Addendum)
Progress Note  Patient Name: Shane Chambers Date of Encounter: 03/08/2020  Primary Cardiologist: No primary care provider on file.   Subjective   No acute overnight events. Patient continue to have some intermittent chest pain. Breathing has improved but is not back to baseline yet. No palpitations.  Inpatient Medications    Scheduled Meds:  aspirin EC  81 mg Oral Daily   atorvastatin  40 mg Oral QHS   Chlorhexidine Gluconate Cloth  6 each Topical Daily   ferrous sulfate  325 mg Oral Q breakfast   furosemide  60 mg Intravenous BID   gabapentin  900 mg Oral BID   insulin aspart  0-20 Units Subcutaneous Q4H   insulin glargine  10 Units Subcutaneous BID   metoprolol  100 mg Oral Daily   sodium chloride flush  3 mL Intravenous Q12H   sodium chloride flush  3 mL Intravenous Q12H   sodium chloride flush  3 mL Intravenous Q12H   tamsulosin  0.4 mg Oral Daily   Continuous Infusions:  sodium chloride     heparin 2,000 Units/hr (03/08/20 0607)   nitroGLYCERIN 5 mcg/min (03/07/20 0400)   PRN Meds: sodium chloride, acetaminophen **OR** acetaminophen, albuterol, morphine injection, nitroGLYCERIN, ondansetron (ZOFRAN) IV, sodium chloride flush   Vital Signs    Vitals:   03/07/20 2100 03/07/20 2146 03/07/20 2201 03/08/20 0316  BP:  (!) 153/70  (!) 114/53  Pulse: 80 83 83 75  Resp: (!) 21 (!) 21 17 19   Temp:    97.8 F (36.6 C)  TempSrc:    Oral  SpO2: 95% 95% 95% 96%  Weight:    132 kg  Height:        Intake/Output Summary (Last 24 hours) at 03/08/2020 0735 Last data filed at 03/08/2020 0606 Gross per 24 hour  Intake 479.84 ml  Output 3850 ml  Net -3370.16 ml   Last 3 Weights 03/08/2020 03/07/2020 03/06/2020  Weight (lbs) 291 lb 296 lb 4.8 oz 293 lb 14 oz  Weight (kg) 131.997 kg 134.4 kg 133.3 kg      Telemetry    Normal sinus rhythm, rate in the 60's to 80's. - Personally Reviewed  ECG    No new ECG tracing today. - Personally Reviewed  Physical  Exam   GEN: Morbidly obese Caucasian male in no acute distress.   Neck: Supple. JVD difficult to assess due to body habitus. Cardiac: RRR. No murmurs, rubs, or gallops. Right radial cath site soft with no signs of hematoma. Respiratory: Decreased breath sounds bilaterally with faint crackles in bases. GI: Soft, non-distended, and non-tender. Bowel sounds present. MS: No to trace lower extremity edema. No deformity. Skin: Warm and dry. Neuro:  No focal deficits.  Psych: Normal affect.  Labs    High Sensitivity Troponin:   Recent Labs  Lab 03/06/20 1114 03/06/20 1347 03/06/20 1613 03/06/20 1800 03/06/20 2003  TROPONINIHS 3,835* 4,590* 4,752* 4,111* 3,519*      Chemistry Recent Labs  Lab 03/05/20 1330 03/05/20 1330 03/06/20 0412 03/07/20 0412 03/07/20 1633  NA 131*   < > 133* 134* 137   138  K 4.5   < > 4.0 3.4* 3.5   3.6  CL 94*  --  93* 93*  --   CO2 26  --  25 28  --   GLUCOSE 311*  --  424* 228*  --   BUN 22*  --  24* 36*  --   CREATININE 0.88  --  0.88  1.07  --   CALCIUM 9.2  --  9.7 9.4  --   PROT 7.4  --   --   --   --   ALBUMIN 3.7  --   --   --   --   AST 36  --   --   --   --   ALT 21  --   --   --   --   ALKPHOS 113  --   --   --   --   BILITOT 1.1  --   --   --   --   GFRNONAA >60  --  >60 >60  --   GFRAA >60  --  >60 >60  --   ANIONGAP 11  --  15 13  --    < > = values in this interval not displayed.     Hematology Recent Labs  Lab 03/06/20 0412 03/06/20 0412 03/07/20 0412 03/07/20 1633 03/08/20 0253  WBC 10.7*  --  18.0*  --  13.6*  RBC 4.28  --  4.00*  --  4.09*  HGB 12.3*   < > 11.3* 12.2*   12.6* 11.9*  HCT 38.1*   < > 36.0* 36.0*   37.0* 37.0*  MCV 89.0  --  90.0  --  90.5  MCH 28.7  --  28.3  --  29.1  MCHC 32.3  --  31.4  --  32.2  RDW 13.5  --  13.4  --  13.5  PLT 299  --  321  --  325   < > = values in this interval not displayed.    BNP Recent Labs  Lab 03/05/20 1330  BNP 463.0*     DDimer  Recent Labs  Lab  03/05/20 1330  DDIMER 1.08*     Radiology    CARDIAC CATHETERIZATION  Result Date: 03/07/2020  Prox RCA lesion is 99% stenosed.  Prox RCA to Mid RCA lesion is 40% stenosed.  Dist RCA lesion is 30% stenosed.  Prox LAD to Mid LAD lesion is 80% stenosed.  1st Diag lesion is 60% stenosed.  Mid LM to Dist LM lesion is 40% stenosed.  Prox Cx to Mid Cx lesion is 95% stenosed.  1. 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 Moderately elevated LV/right heart filling pressures c/w known diagnosis of heart failure 3. Known severe LV dysfunction by echo Recommend: review revascularization options. Pt has surgical anatomy, but not sure he will be a candidate for CABG. High-risk multivessel PCI could be considered if he is not a candidate for surgery.   ECHOCARDIOGRAM LIMITED  Result Date: 03/06/2020    ECHOCARDIOGRAM LIMITED REPORT   Patient Name:   Shane Chambers Date of Exam: 03/06/2020 Medical Rec #:  [id number]       Height:       69.0 in Accession #:    [id number]      Weight:       293.9 lb Date of Birth:  10-27-1961       BSA:          2.433 m Patient Age:    59 years        BP:           122/85 mmHg Patient Gender: M               HR:  81 bpm. Exam Location:  [name] Procedure: Limited Echo Indications:    Elevated Troponin  History:        Patient has prior history of Echocardiogram examinations, most                 recent 12/21/2019. Risk Factors:Diabetes, Hypertension,                 Dyslipidemia and Former Smoker. Acute respiratory failure with                 hypoxia , Pneumonia due to COVID-19 virus.  Sonographer:    [name] RDCS (AE) Referring Phys: [phone] Royanne Foots [place]  1. Left ventricular ejection fraction, by estimation, is 20 to 25%. The left ventricle has severely decreased function. The left ventricle demonstrates global  hypokinesis. There is moderate left ventricular hypertrophy. Left ventricular diastolic parameters are consistent with Grade III diastolic dysfunction (restrictive).  2. Right ventricular systolic function is low normal. The right ventricular size is normal. Tricuspid regurgitation signal is inadequate for assessing PA pressure.  3. Left atrial size was mildly dilated.  4. The mitral valve is grossly normal. Mild mitral valve regurgitation.  5. The aortic valve was not well visualized. Aortic valve regurgitation is not visualized. Mild aortic valve sclerosis is present, with no evidence of aortic valve stenosis.  6. IVC dilated but respirophasic change not well visualized. FINDINGS  Left Ventricle: Left ventricular ejection fraction, by estimation, is 20 to 25%. The left ventricle has severely decreased function. The left ventricle demonstrates global hypokinesis. The left ventricular internal cavity size was normal in size. There is moderate left ventricular hypertrophy. Right Ventricle: The right ventricular size is normal. No increase in right ventricular wall thickness. Right ventricular systolic function is low normal. Tricuspid regurgitation signal is inadequate for assessing PA pressure. Left Atrium: Left atrial size was mildly dilated. Right Atrium: Right atrial size was normal in size. Pericardium: Trivial pericardial effusion is present. The pericardial effusion is circumferential. Mitral Valve: The mitral valve is grossly normal. Mild mitral valve regurgitation. Tricuspid Valve: The tricuspid valve is grossly normal. Tricuspid valve regurgitation is trivial. Aortic Valve: The aortic valve was not well visualized. Aortic valve regurgitation is not visualized. Mild aortic valve sclerosis is present, with no evidence of aortic valve stenosis. Mild aortic valve annular calcification. Aorta: The aortic root is normal in size and structure. Venous: IVC dilated but respirophasic change not well visualized.  IAS/Shunts: No atrial level shunt detected by color flow Doppler.  LEFT VENTRICLE PLAX 2D LVIDd:         5.17 cm  Diastology LVIDs:         4.67 cm  LV e' lateral:   4.57 cm/s LV PW:         1.62 cm  LV E/e' lateral: 22.3 LV IVS:        1.26 cm  LV e' medial:    4.79 cm/s LVOT diam:     2.10 cm  LV E/e' medial:  21.3 LV SV:         55 LV SV Index:   23 LVOT Area:     3.46 cm  LEFT ATRIUM           Index LA diam:      5.40 cm 2.22 cm/m LA Vol (A4C): 91.8 ml 37.73 ml/m  AORTIC VALVE LVOT Vmax:   79.00 cm/s LVOT Vmean:  56.000 cm/s LVOT VTI:    0.159 m  AORTA Ao Root diam: 3.00 cm MITRAL VALVE MV Area (PHT): 4.52 cm     SHUNTS MV Decel Time: 168 msec     Systemic VTI:  0.16 m MV E velocity: 102.00 cm/s  Systemic Diam: 2.10 cm MV A velocity: 36.00 cm/s MV E/A ratio:  2.83 Rozann Lesches MD Electronically signed by Rozann Lesches MD Signature Date/Time: 03/06/2020/12:59:22 PM    Final     Cardiac Studies   Echocardiogram 03/06/2020: Impressions: 1. Left ventricular ejection fraction, by estimation, is 20 to 25%. The  left ventricle has severely decreased function. The left ventricle  demonstrates global hypokinesis. There is moderate left ventricular  hypertrophy. Left ventricular diastolic  parameters are consistent with Grade III diastolic dysfunction  (restrictive).  2. Right ventricular systolic function is low normal. The right  ventricular size is normal. Tricuspid regurgitation signal is inadequate  for assessing PA pressure.  3. Left atrial size was mildly dilated.  4. The mitral valve is grossly normal. Mild mitral valve regurgitation.  5. The aortic valve was not well visualized. Aortic valve regurgitation  is not visualized. Mild aortic valve sclerosis is present, with no  evidence of aortic valve stenosis.  6. IVC dilated but respirophasic change not well visualized.  _______________  Right/Left Heart Catheterization 03/07/2020:  Prox RCA lesion is 99% stenosed.  Prox RCA  to Mid RCA lesion is 40% stenosed.  Dist RCA lesion is 30% stenosed.  Prox LAD to Mid LAD lesion is 80% stenosed.  1st Diag lesion is 60% stenosed.  Mid LM to Dist LM lesion is 40% stenosed.  Prox Cx to Mid Cx lesion is 95% stenosed.   1. 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 Moderately elevated LV/right heart filling pressures c/w known diagnosis of heart failure 3. Known severe LV dysfunction by echo  Recommend: review revascularization options. Patient has surgical anatomy, but not sure he will be a candidate for CABG. High-risk multivessel PCI could be considered if he is not a candidate for surgery.  Patient Profile     58 y.o. male with a history of hypertension, hyperlipidemia, type 2 diabetes mellitus, and depression who initially presented to [facility] on 03/05/2020 with shortness of breath and atypical chest pain. Ruled in for NSTEMI and found to have an EF of 20-25% on Echo. Transferred to [facility] for right/left heart catheterization.  Assessment & Plan    NSTEMI - Patient presented with atypical chest pain. Continue to report intermittent mild chest pain at rest this morning. - EKG showed new inferior ST depressions.  - High-sensitivity troponin peaked at 4,590 and then down trended.  - Echo showed LVEF of 2-25% with global hypokinesis and grade III diastolic dysfunction.  - Patient underwent right/left heart cath on 4/28 which showed severe multivessel CAD, moderately elevated LV and right heart filling pressure. Patient felt to have surgical anatomy but unclear if he will be a candidate for CABG. High-risk multivessel PCI could be considered if he is not a candidate for surgery. - Continue aspirin, beta-blocker, and statin.   Acute Systolic CHF - BNP elevated at 463.  - Echo showed  LVEF of 20  -25% with global hypokinesis and grade  III diastolic dysfunction. RV systolic function low normal. Also noted to ave mild MR. - Right heart cath showed moderated elevated LV and right heart filling pressures. - Currently being diuresed with IV Lasix  60mg  twice daily. Documented urinary output of 3.85 L in the past 24 hours and net negative 8 L since admission. Weight 291 lbs today, down 5 lbs since admission. Today's BMET pending.  - Continue current dose of Lasix for now.  - Continue Toprol-XL 100mg  daily. - Possible throat swelling with Enalapril in the past so will not start ARB/Entresto. - If renal function stable can add Spironolactone. Can also consider adding Hydralazine and Imdur.  - Consider adding SGLT2 inhibitor.  - Continue to monitor daily weights, strict I/O's, and renal function.   Hypertension - BP elevated at time but well controlled this morning.  - Continue Toprol as above.  - May add Spironolactone if renal function stable.  Hyperlipidemia - Will check lipid panel in the morning. LDL goal <70 given CAD. - Continue Lipitor 40mg  daily for now.  Type 2 Diabetes Mellitus - Consider adding SGLT2 inhibitor. - Management per primary team.   Leukocytosis  - WBC 13.6 this morning.  - Management per primary team.    For questions or updates, please contact [place] Please consult [email or web address] for contact info under        Signed, Darreld Mclean, PA-C  03/08/2020, 7:35 AM    ADDENDUM:  Renal function stable. BUN improving. Therefore, will add Spironolactone 25mg  daily.  LDL also came back at 92 so will increase Lipitor to 80mg  daily.  Darreld Mclean, PA-C 03/08/2020 9:34 AM    Patient seen and examined. Agree with assessment and plan.  Angiograms reviewed.  Subtotal RCA ostial stenosis with high-grade multivessel CAD.  Patient has noticed some increased shortness of breath and still has mild residual chest pain.  Continue IV heparin.  Titrate IV nitroglycerin to 20 mcg presently with  further titration as needed.  He has good distal targets.  A prior echo on December 21, 2019 showed an EF of 50 to 55%.  Most recent echo from March 06, 2020 now shows severe LV dysfunction with EF 20 to 25%, suspect ischemic cardiomyopathy with probable hibernating myocardium in light of high-grade complex multivessel CAD.  Recommend surgical consultation for consideration of CABG revascularization.  We will add spironolactone for aldosterone blockade.  With history of possible throat swelling on enalapril in the past, consider future hydralazine/nitrates.  Troy Sine, MD, [facility] 03/08/2020 10:48 AM

## 2020-03-08 NOTE — Care Management (Signed)
1128 03-08-20 Case Manager received consult for cost on empagliflozin10 mg daily or dapagliflozin 10 mg daily. Patient has Medicaid and cost will be less than $3.00 -[place] and [place], both on the [facility] Formulary for 2021. Bethena Roys, RN,BSN Case Manager

## 2020-03-08 NOTE — Progress Notes (Signed)
[place] for Heparin Indication: chest pain/ACS  Allergies  Allergen Reactions  . Ace Inhibitors Swelling and Cough  . Other Itching    [name]  . Penicillins     Has patient had a PCN reaction causing immediate rash, facial/tongue/throat swelling, SOB or lightheadedness with hypotension: Unknown Has patient had a PCN reaction causing severe rash involving mucus membranes or skin necrosis: Unknown Has patient had a PCN reaction that required hospitalization: Unknown Has patient had a PCN reaction occurring within the last 10 years: Unknown If all of the above answers are "NO", then may proceed with Cephalosporin use.     Patient Measurements: Height: 5\' 9"  (175.3 cm) Weight: 132 kg (291 lb) IBW/kg (Calculated) : 70.7 Heparin Dosing Weight: 102 kg  Vital Signs: Temp: 97.8 F (36.6 C) (04/29 0316) Temp Source: Oral (04/29 0316) BP: 114/53 (04/29 0316) Pulse Rate: 75 (04/29 0316)  Labs: Recent Labs    03/05/20 1330 03/05/20 1540 03/06/20 0412 03/06/20 0412 03/06/20 0648 03/06/20 0909 03/06/20 1500 03/06/20 1613 03/06/20 1800 03/06/20 2003 03/06/20 2204 03/07/20 0412 03/07/20 0412 03/07/20 1633 03/08/20 0253  HGB 11.1*   < > 12.3*   < >  --   --   --   --   --   --   --  11.3*   < > 12.2*  12.6* 11.9*  HCT 35.1*   < > 38.1*   < >  --   --   --   --   --   --   --  36.0*  --  36.0*  37.0* 37.0*  PLT 280   < > 299  --   --   --   --   --   --   --   --  321  --   --  325  APTT  --   --   --   --  30  --   --   --   --   --   --   --   --   --   --   LABPROT  --   --  14.3  --  14.1  --   --   --   --   --   --   --   --   --   --   INR  --   --  1.1  --  1.1  --   --   --   --   --   --   --   --   --   --   HEPARINUNFRC  --   --   --   --   --    < >   < >  --   --   --  0.41 0.45  --   --  0.13*  CREATININE 0.88  --  0.88  --   --   --   --   --   --   --   --  1.07  --   --   --   TROPONINIHS 81*   < > 690*  --   --     < >  --  4,752* 4,111* 3,519*  --   --   --   --   --    < > = values in this interval not displayed.    Estimated Creatinine Clearance: 102.6 mL/min (by C-G formula based on SCr of  1.07 mg/dL).   Assessment: 59 y/o M admitted with ShOB and chest pain. Pharmacy consulted to initiate heparin infusion. Cardiology consult ordered and diagnosed with NSTEMI. Transferred to [facility] for right heart cath. Found to have multivessel CAD, will be evaluated for surgery. Continue heparin drip post cath.  Heparin level subtherapeutic (0.13) on gtt at 1700 units/hr. No issues with line or bleeding reported per RN.  Goal of Therapy:  Heparin level 0.3-0.7 units/ml Monitor platelets by anticoagulation protocol: Yes   Plan:  Increase heparin infusion to 2000 units/hr. F/u 6 hr heparin level  Sherlon Handing, PharmD, BCPS Please see amion for complete clinical pharmacist phone list 03/08/2020, 4:22 AM

## 2020-03-08 NOTE — Progress Notes (Signed)
[place] for Heparin Indication: chest pain/ACS  Allergies  Allergen Reactions  . Ace Inhibitors Swelling and Cough  . Other Itching    [name]  . Penicillins     Has patient had a PCN reaction causing immediate rash, facial/tongue/throat swelling, SOB or lightheadedness with hypotension: Unknown Has patient had a PCN reaction causing severe rash involving mucus membranes or skin necrosis: Unknown Has patient had a PCN reaction that required hospitalization: Unknown Has patient had a PCN reaction occurring within the last 10 years: Unknown If all of the above answers are "NO", then may proceed with Cephalosporin use.     Patient Measurements: Height: 5\' 9"  (175.3 cm) Weight: 132 kg (291 lb) IBW/kg (Calculated) : 70.7 Heparin Dosing Weight: 102 kg  Vital Signs: Temp: 100.9 F (38.3 C) (04/29 1718) Temp Source: Oral (04/29 1718) BP: 145/97 (04/29 1718) Pulse Rate: 71 (04/29 1718)  Labs: Recent Labs     0000 03/06/20 0412 03/06/20 [id number] 03/06/20 0909 03/06/20 1613 03/06/20 1800 03/06/20 2003 03/06/20 2204 03/07/20 0412 03/07/20 0412 03/07/20 1633 03/08/20 0253 03/08/20 0802 03/08/20 1115 03/08/20 1813  HGB   < > 12.3*  --   --   --   --   --   --  11.3*   < > 12.2*  12.6* 11.9*  --   --   --   HCT   < > 38.1*  --   --   --   --   --   --  36.0*  --  36.0*  37.0* 37.0*  --   --   --   PLT  --  299  --   --   --   --   --   --  321  --   --  325  --   --   --   APTT  --   --  30  --   --   --   --   --   --   --   --   --   --   --   --   LABPROT  --  14.3 14.1  --   --   --   --   --   --   --   --   --   --   --   --   INR  --  1.1 1.1  --   --   --   --   --   --   --   --   --   --   --   --   HEPARINUNFRC  --   --   --    < >  --   --   --    < > 0.45   < >  --  0.13*  --  0.40 0.56  CREATININE  --  0.88  --   --   --   --   --   --  1.07  --   --   --  0.99  --   --   TROPONINIHS  --  690*  --    < > 4,752* 4,111*  3,519*  --   --   --   --   --   --   --   --    < > = values in this interval not displayed.    Estimated Creatinine Clearance: 110.9 mL/min (by C-G formula based  on SCr of 0.99 mg/dL).   Assessment: 59 y/o M admitted with ShOB and chest pain. Pharmacy consulted to initiate heparin infusion. Cardiology consult ordered and diagnosed with NSTEMI. Transferred to [facility] for right heart cath. Found to have multivessel CAD, will be evaluated for surgery. Continue heparin drip post cath.  Heparin level remains therapeutic (0.56) on 2000 units/hr. Hgb 11.9, plt 325 on AM labs - stable. No s/sx of bleeding or infusion issues.   Goal of Therapy:  Heparin level 0.3-0.7 units/ml Monitor platelets by anticoagulation protocol: Yes   Plan:  Continue heparin infusion at 2000 units/hr. Monitor daily HL, CBC, and for s/sx of bleeding  Sloan Leiter, PharmD, BCPS, BCCCP Clinical Pharmacist Please refer to [facility] for [place] numbers 03/08/2020 6:56 PM
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
## 2020-03-08 NOTE — Progress Notes (Signed)
PROGRESS NOTE    Shane Chambers  [id number] DOB: 1961-07-14 DOA: 03/05/2020 PCP: [name], MD   Brief Narrative:  59 year old with history of DM2, peripheral neuropathy, HTN, BPH, presented to [facility] with acute diastolic CHF, elevated troponin.  Seen by cardiology and transferred to [facility] for further management.  LHC 4/28 performed showed severe multivessel CAD with elevated filling pressures, poor candidate for CABG, high risk for PCI.   Assessment & Plan:   Principal Problem:   Acute on chronic combined systolic and diastolic CHF (congestive heart failure) (HCC) Active Problems:   SOB (shortness of breath)   Hypertension   Type 2 diabetes mellitus (HCC)   Hyperlipidemia   BPH (benign prostatic hyperplasia)   Acute respiratory failure with hypoxia (HCC)   Acute hyponatremia  Acute hypoxic respiratory failure Acute congestive heart failure with reduced ejection fraction, 20%, grade 3 DD Multivessel coronary artery disease -Cardiology team is following.  Supplemental oxygen as needed -Continue IV Lasix 60 mg twice daily -LHC-severe multivessel CAD with elevated filling pressures.  Has surgical anatomy but poor candidate for CABG, high risk PCI. -Cardiology to consult CT surgery -Supportive care.  Monitor electrolytes. -Check lipid panel, A1c 10.2 -CTA chest-negative for PE -Continue aspirin, statin, beta-blocker  Diabetes mellitus type 2, poorly controlled due to hyperglycemia -Insulin sliding scale Accu-Chek, A1c 10.2 -Lantus 14 units twice daily -Gabapentin 900 mg twice daily  Essential hypertension -On IV Lasix -Toprol-XL 100 mg daily  Hyperlipidemia -Statin  BPH -Flomax  Morbid obesity with BMI greater than 40 -Diet and weight loss   DVT prophylaxis: Heparin drip Code Status: Full code Family Communication:    Status is: Inpatient  Remains inpatient appropriate because:Ongoing diagnostic testing needed not appropriate  for outpatient work up   Dispo: The patient is from: Home              Anticipated d/c is to: Home              Anticipated d/c date is: 1 day              Patient currently is not medically stable to d/c. Subjective: Intermittent chest discomfort otherwise no other complaints for now  Review of Systems Otherwise negative except as per HPI, including: General: Denies fever, chills, night sweats or unintended weight loss. Resp: Denies cough, wheezing, shortness of breath. Cardiac: Denies  palpitations, orthopnea, paroxysmal nocturnal dyspnea. GI: Denies abdominal pain, nausea, vomiting, diarrhea or constipation GU: Denies dysuria, frequency, hesitancy or incontinence MS: Denies muscle aches, joint pain or swelling Neuro: Denies headache, neurologic deficits (focal weakness, numbness, tingling), abnormal gait Psych: Denies anxiety, depression, SI/HI/AVH Skin: Denies new rashes or lesions ID: Denies sick contacts, exotic exposures, travel  Examination: Constitutional: Not in acute distress Respiratory: Clear to auscultation bilaterally Cardiovascular: Normal sinus rhythm, no rubs Abdomen: Nontender nondistended good bowel sounds Musculoskeletal: No edema noted Skin: No rashes seen Neurologic: CN 2-12 grossly intact.  And nonfocal Psychiatric: Normal judgment and insight. Alert and oriented x 3. Normal mood.  Objective: Vitals:   03/07/20 2100 03/07/20 2146 03/07/20 2201 03/08/20 0316  BP:  (!) 153/70  (!) 114/53  Pulse: 80 83 83 75  Resp: (!) 21 (!) 21 17 19   Temp:    97.8 F (36.6 C)  TempSrc:    Oral  SpO2: 95% 95% 95% 96%  Weight:    132 kg  Height:        Intake/Output Summary (Last 24 hours) at  03/08/2020 0757 Last data filed at 03/08/2020 0606 Gross per 24 hour  Intake 479.84 ml  Output 3850 ml  Net -3370.16 ml   Filed Weights   03/06/20 0711 03/07/20 0503 03/08/20 0316  Weight: 133.3 kg 134.4 kg 132 kg     Data Reviewed:   CBC: Recent Labs  Lab  03/05/20 1330 03/06/20 0412 03/07/20 0412 03/07/20 1633 03/08/20 0253  WBC 12.6* 10.7* 18.0*  --  13.6*  NEUTROABS 9.2* 9.5*  --   --   --   HGB 11.1* 12.3* 11.3* 12.2*  12.6* 11.9*  HCT 35.1* 38.1* 36.0* 36.0*  37.0* 37.0*  MCV 90.2 89.0 90.0  --  90.5  PLT 280 299 321  --  [id number]   Basic Metabolic Panel: Recent Labs  Lab 03/05/20 1330 03/05/20 1540 03/06/20 0412 03/07/20 0412 03/07/20 1633  NA 131*  --  133* 134* 137  138  K 4.5  --  4.0 3.4* 3.5  3.6  CL 94*  --  93* 93*  --   CO2 26  --  25 28  --   GLUCOSE 311*  --  424* 228*  --   BUN 22*  --  24* 36*  --   CREATININE 0.88  --  0.88 1.07  --   CALCIUM 9.2  --  9.7 9.4  --   MG  --  1.8 1.7 2.0  --   PHOS  --   --  5.6*  --   --    GFR: Estimated Creatinine Clearance: 102.6 mL/min (by C-G formula based on SCr of 1.07 mg/dL). Liver Function Tests: Recent Labs  Lab 03/05/20 1330  AST 36  ALT 21  ALKPHOS 113  BILITOT 1.1  PROT 7.4  ALBUMIN 3.7   No results for input(s): LIPASE, AMYLASE in the last 168 hours. No results for input(s): AMMONIA in the last 168 hours. Coagulation Profile: Recent Labs  Lab 03/06/20 0412 03/06/20 0648  INR 1.1 1.1   Cardiac Enzymes: No results for input(s): CKTOTAL, CKMB, CKMBINDEX, TROPONINI in the last 168 hours. BNP (last 3 results) No results for input(s): PROBNP in the last 8760 hours. HbA1C: Recent Labs    03/06/20 0909  HGBA1C 10.2*   CBG: Recent Labs  Lab 03/07/20 1542 03/07/20 1832 03/07/20 2103 03/08/20 0046 03/08/20 0410  GLUCAP 177* 192* 274* 277* 227*   Lipid Profile: No results for input(s): CHOL, HDL, LDLCALC, TRIG, CHOLHDL, LDLDIRECT in the last 72 hours. Thyroid Function Tests: Recent Labs    03/05/20 1540  TSH 3.350   Anemia Panel: No results for input(s): VITAMINB12, FOLATE, FERRITIN, TIBC, IRON, RETICCTPCT in the last 72 hours. Sepsis Labs: No results for input(s): PROCALCITON, LATICACIDVEN in the last 168 hours.  Recent Results  (from the past 240 hour(s))  Respiratory Panel by RT PCR (Flu A&B, Covid) - Nasopharyngeal Swab     Status: None   Collection Time: 03/05/20  1:29 PM   Specimen: Nasopharyngeal Swab  Result Value Ref Range Status   SARS Coronavirus 2 by RT PCR NEGATIVE NEGATIVE Final    Comment: (NOTE) SARS-CoV-2 target nucleic acids are NOT DETECTED. The SARS-CoV-2 RNA is generally detectable in upper respiratoy specimens during the acute phase of infection. The lowest concentration of SARS-CoV-2 viral copies this assay can detect is 131 copies/mL. A negative result does not preclude SARS-Cov-2 infection and should not be used as the sole basis for treatment or other patient management decisions. A negative result may occur with  improper specimen collection/handling, submission of specimen other than nasopharyngeal swab, presence of viral mutation(s) within the areas targeted by this assay, and inadequate number of viral copies (<131 copies/mL). A negative result must be combined with clinical observations, patient history, and epidemiological information. The expected result is Negative. Fact Sheet for Patients:  PinkCheek.be Fact Sheet for Healthcare Providers:  GravelBags.it This test is not yet ap proved or cleared by the [place] FDA and  has been authorized for detection and/or diagnosis of SARS-CoV-2 by FDA under an Emergency Use Authorization (EUA). This EUA will remain  in effect (meaning this test can be used) for the duration of the COVID-19 declaration under Section 564(b)(1) of the Act, 21 U.S.C. section 360bbb-3(b)(1), unless the authorization is terminated or revoked sooner.    Influenza A by PCR NEGATIVE NEGATIVE Final   Influenza B by PCR NEGATIVE NEGATIVE Final    Comment: (NOTE) The Xpert Xpress SARS-CoV-2/FLU/RSV assay is intended as an aid in  the diagnosis of influenza from Nasopharyngeal swab specimens and    should not be used as a sole basis for treatment. Nasal washings and  aspirates are unacceptable for Xpert Xpress SARS-CoV-2/FLU/RSV  testing. Fact Sheet for Patients: PinkCheek.be Fact Sheet for Healthcare Providers: GravelBags.it This test is not yet approved or cleared by the [place] FDA and  has been authorized for detection and/or diagnosis of SARS-CoV-2 by  FDA under an Emergency Use Authorization (EUA). This EUA will remain  in effect (meaning this test can be used) for the duration of the  Covid-19 declaration under Section 564(b)(1) of the Act, 21  U.S.C. section 360bbb-3(b)(1), unless the authorization is  terminated or revoked. Performed at [facility], [address], [place], [place] [address]   MRSA PCR Screening     Status: None   Collection Time: 03/06/20 12:36 AM   Specimen: Nasal Mucosa; Nasopharyngeal  Result Value Ref Range Status   MRSA by PCR NEGATIVE NEGATIVE Final    Comment:        The GeneXpert MRSA Assay (FDA approved for NASAL specimens only), is one component of a comprehensive MRSA colonization surveillance program. It is not intended to diagnose MRSA infection nor to guide or monitor treatment for MRSA infections. Performed at [facility], [address], [place], [place] [address]          Radiology Studies: CARDIAC CATHETERIZATION  Result Date: 03/07/2020  Prox RCA lesion is 99% stenosed.  Prox RCA to Mid RCA lesion is 40% stenosed.  Dist RCA lesion is 30% stenosed.  Prox LAD to Mid LAD lesion is 80% stenosed.  1st Diag lesion is 60% stenosed.  Mid LM to Dist LM lesion is 40% stenosed.  Prox Cx to Mid Cx lesion is 95% stenosed.  1. 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 Moderately elevated LV/right heart filling pressures c/w known diagnosis  of heart failure 3. Known severe LV dysfunction by echo Recommend: review revascularization options. Pt has surgical anatomy, but not sure he will be a candidate for CABG. High-risk multivessel PCI could be considered if he is not a candidate for surgery.   ECHOCARDIOGRAM LIMITED  Result Date: 03/06/2020    ECHOCARDIOGRAM LIMITED REPORT   Patient Name:   Shane Chambers Date of Exam: 03/06/2020 Medical Rec #:  [id number]       Height:       69.0 in  Accession #:    [id number]      Weight:       293.9 lb Date of Birth:  1961/05/09       BSA:          2.433 m Patient Age:    72 years        BP:           122/85 mmHg Patient Gender: M               HR:           81 bpm. Exam Location:  [name] Procedure: Limited Echo Indications:    Elevated Troponin  History:        Patient has prior history of Echocardiogram examinations, most                 recent 12/21/2019. Risk Factors:Diabetes, Hypertension,                 Dyslipidemia and Former Smoker. Acute respiratory failure with                 hypoxia , Pneumonia due to COVID-19 virus.  Sonographer:    [name] RDCS (AE) Referring Phys: [phone] Royanne Foots [place]  1. Left ventricular ejection fraction, by estimation, is 20 to 25%. The left ventricle has severely decreased function. The left ventricle demonstrates global hypokinesis. There is moderate left ventricular hypertrophy. Left ventricular diastolic parameters are consistent with Grade III diastolic dysfunction (restrictive).  2. Right ventricular systolic function is low normal. The right ventricular size is normal. Tricuspid regurgitation signal is inadequate for assessing PA pressure.  3. Left atrial size was mildly dilated.  4. The mitral valve is grossly normal. Mild mitral valve regurgitation.  5. The aortic valve was not well visualized. Aortic valve regurgitation is not visualized. Mild aortic valve sclerosis is present, with no evidence of aortic valve stenosis.  6. IVC dilated but  respirophasic change not well visualized. FINDINGS  Left Ventricle: Left ventricular ejection fraction, by estimation, is 20 to 25%. The left ventricle has severely decreased function. The left ventricle demonstrates global hypokinesis. The left ventricular internal cavity size was normal in size. There is moderate left ventricular hypertrophy. Right Ventricle: The right ventricular size is normal. No increase in right ventricular wall thickness. Right ventricular systolic function is low normal. Tricuspid regurgitation signal is inadequate for assessing PA pressure. Left Atrium: Left atrial size was mildly dilated. Right Atrium: Right atrial size was normal in size. Pericardium: Trivial pericardial effusion is present. The pericardial effusion is circumferential. Mitral Valve: The mitral valve is grossly normal. Mild mitral valve regurgitation. Tricuspid Valve: The tricuspid valve is grossly normal. Tricuspid valve regurgitation is trivial. Aortic Valve: The aortic valve was not well visualized. Aortic valve regurgitation is not visualized. Mild aortic valve sclerosis is present, with no evidence of aortic valve stenosis. Mild aortic valve annular calcification. Aorta: The aortic root is normal in size and structure. Venous: IVC dilated but respirophasic change not well visualized. IAS/Shunts: No atrial level shunt detected by color flow Doppler.  LEFT VENTRICLE PLAX 2D LVIDd:         5.17 cm  Diastology LVIDs:         4.67 cm  LV e' lateral:   4.57 cm/s LV PW:         1.62 cm  LV E/e' lateral: 22.3 LV IVS:        1.26 cm  LV e' medial:  4.79 cm/s LVOT diam:     2.10 cm  LV E/e' medial:  21.3 LV SV:         55 LV SV Index:   23 LVOT Area:     3.46 cm  LEFT ATRIUM           Index LA diam:      5.40 cm 2.22 cm/m LA Vol (A4C): 91.8 ml 37.73 ml/m  AORTIC VALVE LVOT Vmax:   79.00 cm/s LVOT Vmean:  56.000 cm/s LVOT VTI:    0.159 m  AORTA Ao Root diam: 3.00 cm MITRAL VALVE MV Area (PHT): 4.52 cm     SHUNTS MV Decel  Time: 168 msec     Systemic VTI:  0.16 m MV E velocity: 102.00 cm/s  Systemic Diam: 2.10 cm MV A velocity: 36.00 cm/s MV E/A ratio:  2.83 Rozann Lesches MD Electronically signed by Rozann Lesches MD Signature Date/Time: 03/06/2020/12:59:22 PM    Final         Scheduled Meds: . aspirin EC  81 mg Oral Daily  . atorvastatin  40 mg Oral QHS  . Chlorhexidine Gluconate Cloth  6 each Topical Daily  . ferrous sulfate  325 mg Oral Q breakfast  . furosemide  60 mg Intravenous BID  . gabapentin  900 mg Oral BID  . insulin aspart  0-20 Units Subcutaneous Q4H  . insulin glargine  10 Units Subcutaneous BID  . metoprolol  100 mg Oral Daily  . sodium chloride flush  3 mL Intravenous Q12H  . sodium chloride flush  3 mL Intravenous Q12H  . sodium chloride flush  3 mL Intravenous Q12H  . tamsulosin  0.4 mg Oral Daily   Continuous Infusions: . sodium chloride    . heparin 2,000 Units/hr (03/08/20 [id number])  . nitroGLYCERIN 5 mcg/min (03/07/20 0400)     LOS: 3 days   Time spent= 35 mins    Selassie Spatafore Arsenio Loader, MD [facility]  If 7PM-7AM, please contact night-coverage  03/08/2020, 7:57 AM

## 2020-03-08 NOTE — Progress Notes (Signed)
Temp = 100.9 oral. Dr Reesa Chew notified. No new orders at this time. APAP 650 mg PO given per MD instructions.
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
## 2020-03-08 NOTE — Progress Notes (Addendum)
Inpatient Diabetes Program Recommendations  AACE/ADA: New Consensus Statement on Inpatient Glycemic Control (2015)  Target Ranges:  Prepandial:   less than 140 mg/dL      Peak postprandial:   less than 180 mg/dL (1-2 hours)      Critically ill patients:  140 - 180 mg/dL   Lab Results  Component Value Date   GLUCAP 185 (H) 03/08/2020   HGBA1C 10.2 (H) 03/06/2020    Review of Glycemic Control Results for Shane Chambers, Shane Chambers (MRN [id number]) as of 03/08/2020 09:31  Ref. Range 03/07/2020 07:32 03/07/2020 11:17 03/07/2020 14:09 03/07/2020 15:42 03/07/2020 18:32 03/07/2020 21:03 03/08/2020 00:46 03/08/2020 04:10 03/08/2020 08:04  Glucose-Capillary Latest Ref Range: 70 - 99 mg/dL 186 (H) 201 (H) 193 (H) 177 (H) 192 (H) 274 (H) 277 (H) 227 (H) 185 (H)   Diabetes history: DM 2 Outpatient Diabetes medications: 70/30 65 units bid, Metformin 1000 mg bid Current orders for Inpatient glycemic control:  Lantus 10 units bid Novolog 0-20 units Q4 hours  Inpatient Diabetes Program Recommendations:    -  Increase Lantus to 12 units bid  -  Add Novolog meal coverage 4 units tid if eating >50% of meals.   Addendum 12:55 pm: Spoke with pt regarding A1c and glucose control at home. Pt reports he just started with a new PCP back in August 2020, Dr. Woody Seller at [facility]. Pt report no changes were made to his insulin however he increased the dosage himself from 55 units to 65 units. Discussed with pt comparing the amount of insulin given here versus the amount he is taking at home. Discussed portion sizes. Discussed close follow up with PCP and possibly Endocrinology if glucose does not improve within the year.  Watch on current insulin regimen as Lantus was increased to 14 units bid  Thanks,  Tama Headings RN, MSN, BC-ADM Inpatient Diabetes Coordinator Team Pager [phone] (8a-5p)

## 2020-03-08 NOTE — Progress Notes (Signed)
C/O having intermittent CP. NTG gtt increased to 15 mcg/min for goal of 20 mcg/min. BP stable.

## 2020-03-08 NOTE — Progress Notes (Signed)
[facility] for Heparin Indication: chest pain/ACS  Allergies  Allergen Reactions  . Ace Inhibitors Swelling and Cough  . Other Itching    [name]  . Penicillins     Has patient had a PCN reaction causing immediate rash, facial/tongue/throat swelling, SOB or lightheadedness with hypotension: Unknown Has patient had a PCN reaction causing severe rash involving mucus membranes or skin necrosis: Unknown Has patient had a PCN reaction that required hospitalization: Unknown Has patient had a PCN reaction occurring within the last 10 years: Unknown If all of the above answers are "NO", then may proceed with Cephalosporin use.     Patient Measurements: Height: 5\' 9"  (175.3 cm) Weight: 132 kg (291 lb) IBW/kg (Calculated) : 70.7 Heparin Dosing Weight: 102 kg  Vital Signs: Temp: 97.8 F (36.6 C) (04/29 0316) Temp Source: Oral (04/29 0316) BP: 154/69 (04/29 0812) Pulse Rate: 71 (04/29 0812)  Labs: Recent Labs     0000 03/06/20 0412 03/06/20 [id number] 03/06/20 0909 03/06/20 1613 03/06/20 1800 03/06/20 2003 03/06/20 2204 03/07/20 0412 03/07/20 0412 03/07/20 1633 03/08/20 0253 03/08/20 0802 03/08/20 1115  HGB   < > 12.3*  --   --   --   --   --   --  11.3*   < > 12.2*  12.6* 11.9*  --   --   HCT   < > 38.1*  --   --   --   --   --   --  36.0*  --  36.0*  37.0* 37.0*  --   --   PLT  --  299  --   --   --   --   --   --  321  --   --  325  --   --   APTT  --   --  30  --   --   --   --   --   --   --   --   --   --   --   LABPROT  --  14.3 14.1  --   --   --   --   --   --   --   --   --   --   --   INR  --  1.1 1.1  --   --   --   --   --   --   --   --   --   --   --   HEPARINUNFRC  --   --   --    < >  --   --   --    < > 0.45  --   --  0.13*  --  0.40  CREATININE  --  0.88  --   --   --   --   --   --  1.07  --   --   --  0.99  --   TROPONINIHS  --  690*  --    < > 4,752* 4,111* 3,519*  --   --   --   --   --   --   --    < > = values  in this interval not displayed.    Estimated Creatinine Clearance: 110.9 mL/min (by C-G formula based on SCr of 0.99 mg/dL).   Assessment: 59 y/o M admitted with ShOB and chest pain. Pharmacy consulted to initiate heparin infusion. Cardiology consult ordered and diagnosed  with NSTEMI. Transferred to [facility] for right heart cath. Found to have multivessel CAD, will be evaluated for surgery. Continue heparin drip post cath.  Heparin level is therapeutic at 0.4, on 2000 units/hr. Hgb 11.9, plt 325. No s/sx of bleeding or infusion issues.   Goal of Therapy:  Heparin level 0.3-0.7 units/ml Monitor platelets by anticoagulation protocol: Yes   Plan:  Continue heparin infusion at 2000 units/hr. F/u 6 hr heparin level Monitor HL, CBC, and for s/sx of bleeding  Antonietta Jewel, PharmD, [facility] Pharmacist  Phone: [phone] 03/08/2020 12:32 PM  Please check AMION for all [facility] phone numbers After 10:00 PM, call [place] [phone]

## 2020-03-09 DIAGNOSIS — E785 Hyperlipidemia, unspecified: Secondary | ICD-10-CM

## 2020-03-09 DIAGNOSIS — R0602 Shortness of breath: Secondary | ICD-10-CM | POA: Diagnosis not present

## 2020-03-09 DIAGNOSIS — I25110 Atherosclerotic heart disease of native coronary artery with unstable angina pectoris: Secondary | ICD-10-CM | POA: Diagnosis not present

## 2020-03-09 DIAGNOSIS — E876 Hypokalemia: Secondary | ICD-10-CM

## 2020-03-09 DIAGNOSIS — I5043 Acute on chronic combined systolic (congestive) and diastolic (congestive) heart failure: Secondary | ICD-10-CM | POA: Diagnosis not present

## 2020-03-09 DIAGNOSIS — I10 Essential (primary) hypertension: Secondary | ICD-10-CM

## 2020-03-09 LAB — CBC
HCT: 36.9 % — ABNORMAL LOW (ref 39.0–52.0)
Hemoglobin: 11.9 g/dL — ABNORMAL LOW (ref 13.0–17.0)
MCH: 29 pg (ref 26.0–34.0)
MCHC: 32.2 g/dL (ref 30.0–36.0)
MCV: 89.8 fL (ref 80.0–100.0)
Platelets: 310 10*3/uL (ref 150–400)
RBC: 4.11 MIL/uL — ABNORMAL LOW (ref 4.22–5.81)
RDW: 13.3 % (ref 11.5–15.5)
WBC: 13 10*3/uL — ABNORMAL HIGH (ref 4.0–10.5)
nRBC: 0 % (ref 0.0–0.2)

## 2020-03-09 LAB — BASIC METABOLIC PANEL
Anion gap: 12 (ref 5–15)
BUN: 24 mg/dL — ABNORMAL HIGH (ref 6–20)
CO2: 29 mmol/L (ref 22–32)
Calcium: 9.4 mg/dL (ref 8.9–10.3)
Chloride: 96 mmol/L — ABNORMAL LOW (ref 98–111)
Creatinine, Ser: 1.16 mg/dL (ref 0.61–1.24)
GFR calc Af Amer: >60 mL/min (ref >60)
GFR calc non Af Amer: >60 mL/min (ref >60)
Glucose, Bld: 115 mg/dL — ABNORMAL HIGH (ref 70–99)
Potassium: 3.3 mmol/L — ABNORMAL LOW (ref 3.5–5.1)
Sodium: 137 mmol/L (ref 135–145)

## 2020-03-09 LAB — MAGNESIUM: Magnesium: 1.7 mg/dL (ref 1.7–2.4)

## 2020-03-09 LAB — HEPARIN LEVEL (UNFRACTIONATED): Heparin Unfractionated: 0.55 IU/mL (ref 0.30–0.70)

## 2020-03-09 LAB — GLUCOSE, CAPILLARY
Glucose-Capillary: 131 mg/dL — ABNORMAL HIGH (ref 70–99)
Glucose-Capillary: 136 mg/dL — ABNORMAL HIGH (ref 70–99)
Glucose-Capillary: 246 mg/dL — ABNORMAL HIGH (ref 70–99)
Glucose-Capillary: 259 mg/dL — ABNORMAL HIGH (ref 70–99)
Glucose-Capillary: 270 mg/dL — ABNORMAL HIGH (ref 70–99)
Glucose-Capillary: 332 mg/dL — ABNORMAL HIGH (ref 70–99)

## 2020-03-09 MED ORDER — FUROSEMIDE 10 MG/ML IJ SOLN
40.0000 mg | Freq: Two times a day (BID) | INTRAMUSCULAR | Status: DC
  Administered 2020-03-09 – 2020-03-10 (×3): 40 mg via INTRAVENOUS
  Filled 2020-03-09 (×3): qty 4

## 2020-03-09 MED ORDER — VENLAFAXINE HCL ER 37.5 MG PO CP24
112.5000 mg | ORAL_CAPSULE | Freq: Every day | ORAL | Status: DC
  Administered 2020-03-10 – 2020-03-12 (×3): 112.5 mg via ORAL
  Filled 2020-03-09 (×4): qty 3

## 2020-03-09 MED ORDER — POTASSIUM CHLORIDE CRYS ER 20 MEQ PO TBCR
40.0000 meq | EXTENDED_RELEASE_TABLET | Freq: Once | ORAL | Status: AC
  Administered 2020-03-09: 40 meq via ORAL
  Filled 2020-03-09: qty 2

## 2020-03-09 MED ORDER — FLUOXETINE HCL 10 MG PO CAPS
10.0000 mg | ORAL_CAPSULE | Freq: Every day | ORAL | Status: DC
  Administered 2020-03-09 – 2020-03-12 (×4): 10 mg via ORAL
  Filled 2020-03-09 (×4): qty 1

## 2020-03-09 MED ORDER — MAGNESIUM OXIDE 400 (241.3 MG) MG PO TABS
800.0000 mg | ORAL_TABLET | Freq: Once | ORAL | Status: AC
  Administered 2020-03-09: 800 mg via ORAL
  Filled 2020-03-09: qty 2

## 2020-03-09 NOTE — Plan of Care (Signed)

## 2020-03-09 NOTE — Consult Note (Signed)
[facility] Consult   Reason for Consult:  "intermittent hallucinations" Referring Physician:  Dr. Reesa Chew  Patient Identification: Shane Chambers MRN:  [id number] Principal Diagnosis: Acute on chronic combined systolic and diastolic CHF (congestive heart failure) ([place]) Diagnosis:  Principal Problem:   Acute on chronic combined systolic and diastolic CHF (congestive heart failure) ([place]) Active Problems:   SOB (shortness of breath)   Hypertension   Type 2 diabetes mellitus ([place])   Hyperlipidemia   BPH (benign prostatic hyperplasia)   Acute respiratory failure with hypoxia ([place])   Acute hyponatremia   Ischemic cardiomyopathy   Coronary artery disease   Total Time spent with patient: 30 minutes  Subjective:   Shane Chambers is a 59 y.o. male patient.  Patient assessed by nurse practitioner.  Patient alert and oriented, answers appropriately. Patient states "I came to the hospital because I had chest pain." Patient denies current suicidal ideations.  Patient reports history of 1 suicide attempt in 1989.  Patient denies homicidal ideations.  Patient denies auditory and visual hallucinations. Patient reports history of depression.  Patient reports ruminations.  Patient states "sometimes I hear God's will for me or sometimes I hear my voice saying I am stupid and nobody wants me."  Patient treated by primary care with Effexor XR 150 mg daily.  Patient reports compliance with Effexor. Patient reports he lives with his mother, sister and niece.  Patient reports he is disabled and receives disability income.  Patient reports fair appetite.  Patient reports "sometimes I do not sleep well, sometimes only 3 hours per night."  Patient reports "I used to take Valium for sleep but they took that away in 2015."  Patient denies alcohol and substance use.  Patient denies access to weapons. Patient reports current stressor is "my mom digs into me verbally sometimes because she is frustrated  and afraid to." Patient denies outpatient psychiatry at this time.  Patient reports history of talk therapy.  Patient reports plan to follow-up with outpatient psychiatrist and talk therapy.  HPI: Patient admitted with complaints of chest pain.  Past Psychiatric History: Depression  Risk to Self:  Denies Risk to Others:  Denies Prior Inpatient Therapy:   Denies Prior Outpatient Therapy:   Talk therapists x3, none currently  Past Medical History:  Past Medical History:  Diagnosis Date  . Depression   . Diabetes mellitus without complication ([place])   . Hyperlipidemia 12/01/2019  . Hypertension   . Type 2 diabetes mellitus ([place])     Past Surgical History:  Procedure Laterality Date  . DENTAL SURGERY    . RIGHT/LEFT HEART CATH AND CORONARY ANGIOGRAPHY N/A 03/07/2020   Procedure: RIGHT/LEFT HEART CATH AND CORONARY ANGIOGRAPHY;  Surgeon: Sherren Mocha, MD;  Location: [facility];  Service: Cardiovascular;  Laterality: N/A;   Family History:  Family History  Problem Relation Age of Onset  . Hypertension Mother    Family Psychiatric  History: Brother- schizophrenia, completed suicide in 2018 Social History:  Social History   Substance and Sexual Activity  Alcohol Use Yes   Comment: rarely     Social History   Substance and Sexual Activity  Drug Use No    Social History   Socioeconomic History  . Marital status: Single    Spouse name: Not on file  . Number of children: Not on file  . Years of education: Not on file  . Highest education level: Not on file  Occupational History  . Not on file  Tobacco  Use  . Smoking status: Former Smoker  . Smokeless tobacco: Never Used  Substance and Sexual Activity  . Alcohol use: Yes    Comment: rarely  . Drug use: No  . Sexual activity: Not on file  Other Topics Concern  . Not on file  Social History Narrative  . Not on file   Social Determinants of Health   Financial Resource Strain:   . Difficulty of Paying  Living Expenses:   Food Insecurity:   . Worried About Charity fundraiser in the Last Year:   . Arboriculturist in the Last Year:   Transportation Needs:   . Film/video editor (Medical):   Marland Kitchen Lack of Transportation (Non-Medical):   Physical Activity:   . Days of Exercise per Week:   . Minutes of Exercise per Session:   Stress:   . Feeling of Stress :   Social Connections:   . Frequency of Communication with Friends and Family:   . Frequency of Social Gatherings with Friends and Family:   . Attends Religious Services:   . Active Member of Clubs or Organizations:   . Attends Archivist Meetings:   Marland Kitchen Marital Status:    Additional Social History:    Allergies:   Allergies  Allergen Reactions  . Ace Inhibitors Swelling and Cough  . Other Itching    [name]  . Penicillins     Has patient had a PCN reaction causing immediate rash, facial/tongue/throat swelling, SOB or lightheadedness with hypotension: Unknown Has patient had a PCN reaction causing severe rash involving mucus membranes or skin necrosis: Unknown Has patient had a PCN reaction that required hospitalization: Unknown Has patient had a PCN reaction occurring within the last 10 years: Unknown If all of the above answers are "NO", then may proceed with Cephalosporin use.     Labs:  Results for orders placed or performed during the hospital encounter of 03/05/20 (from the past 48 hour(s))  Blood gas, arterial     Status: Abnormal   Collection Time: 03/07/20 10:26 AM  Result Value Ref Range   FIO2 21.00    pH, Arterial 7.418 7.350 - 7.450   pCO2 arterial 47.6 32.0 - 48.0 mmHg   pO2, Arterial 70.0 (L) 83.0 - 108.0 mmHg   Bicarbonate 29.0 (H) 20.0 - 28.0 mmol/L   Acid-Base Excess 5.7 (H) 0.0 - 2.0 mmol/L   O2 Saturation 93.1 %   Patient temperature 36.5    [place] test (pass/fail) PASS PASS    Comment: Performed at [facility], [address], [place], [place] [address]  Glucose, capillary      Status: Abnormal   Collection Time: 03/07/20 11:17 AM  Result Value Ref Range   Glucose-Capillary 201 (H) 70 - 99 mg/dL    Comment: Glucose reference range applies only to samples taken after fasting for at least 8 hours.  Glucose, capillary     Status: Abnormal   Collection Time: 03/07/20  2:09 PM  Result Value Ref Range   Glucose-Capillary 193 (H) 70 - 99 mg/dL    Comment: Glucose reference range applies only to samples taken after fasting for at least 8 hours.  Glucose, capillary     Status: Abnormal   Collection Time: 03/07/20  3:42 PM  Result Value Ref Range   Glucose-Capillary 177 (H) 70 - 99 mg/dL    Comment: Glucose reference range applies only to samples taken after fasting for at least 8 hours.  POCT I-Stat EG7  Status: Abnormal   Collection Time: 03/07/20  4:33 PM  Result Value Ref Range   pH, Ven 7.389 7.250 - 7.430   pCO2, Ven 53.9 44.0 - 60.0 mmHg   pO2, Ven 37.0 32.0 - 45.0 mmHg   Bicarbonate 32.6 (H) 20.0 - 28.0 mmol/L   TCO2 34 (H) 22 - 32 mmol/L   O2 Saturation 68.0 %   Acid-Base Excess 6.0 (H) 0.0 - 2.0 mmol/L   Sodium 138 135 - 145 mmol/L   Potassium 3.6 3.5 - 5.1 mmol/L   Calcium, Ion 1.27 1.15 - 1.40 mmol/L   HCT 37.0 (L) 39.0 - 52.0 %   Hemoglobin 12.6 (L) 13.0 - 17.0 g/dL   Sample type VENOUS   I-STAT 7, (LYTES, BLD GAS, ICA, H+H)     Status: Abnormal   Collection Time: 03/07/20  4:33 PM  Result Value Ref Range   pH, Arterial 7.467 (H) 7.350 - 7.450   pCO2 arterial 43.1 32.0 - 48.0 mmHg   pO2, Arterial 121 (H) 83.0 - 108.0 mmHg   Bicarbonate 31.2 (H) 20.0 - 28.0 mmol/L   TCO2 32 22 - 32 mmol/L   O2 Saturation 99.0 %   Acid-Base Excess 7.0 (H) 0.0 - 2.0 mmol/L   Sodium 137 135 - 145 mmol/L   Potassium 3.5 3.5 - 5.1 mmol/L   Calcium, Ion 1.22 1.15 - 1.40 mmol/L   HCT 36.0 (L) 39.0 - 52.0 %   Hemoglobin 12.2 (L) 13.0 - 17.0 g/dL   Sample type ARTERIAL   Glucose, capillary     Status: Abnormal   Collection Time: 03/07/20  6:32 PM  Result Value  Ref Range   Glucose-Capillary 192 (H) 70 - 99 mg/dL    Comment: Glucose reference range applies only to samples taken after fasting for at least 8 hours.  Glucose, capillary     Status: Abnormal   Collection Time: 03/07/20  9:03 PM  Result Value Ref Range   Glucose-Capillary 274 (H) 70 - 99 mg/dL    Comment: Glucose reference range applies only to samples taken after fasting for at least 8 hours.  Glucose, capillary     Status: Abnormal   Collection Time: 03/08/20 12:46 AM  Result Value Ref Range   Glucose-Capillary 277 (H) 70 - 99 mg/dL    Comment: Glucose reference range applies only to samples taken after fasting for at least 8 hours.  Heparin level (unfractionated)     Status: Abnormal   Collection Time: 03/08/20  2:53 AM  Result Value Ref Range   Heparin Unfractionated 0.13 (L) 0.30 - 0.70 IU/mL    Comment: (NOTE) If heparin results are below expected values, and patient dosage has  been confirmed, suggest follow up testing of antithrombin III levels. Performed at [facility], [place] [address], [place], [place] [address]   CBC     Status: Abnormal   Collection Time: 03/08/20  2:53 AM  Result Value Ref Range   WBC 13.6 (H) 4.0 - 10.5 K/uL   RBC 4.09 (L) 4.22 - 5.81 MIL/uL   Hemoglobin 11.9 (L) 13.0 - 17.0 g/dL   HCT 37.0 (L) 39.0 - 52.0 %   MCV 90.5 80.0 - 100.0 fL   MCH 29.1 26.0 - 34.0 pg   MCHC 32.2 30.0 - 36.0 g/dL   RDW 13.5 11.5 - 15.5 %   Platelets 325 150 - 400 K/uL   nRBC 0.0 0.0 - 0.2 %    Comment: Performed at [facility],  [address], [place], [place] [address]  Glucose, capillary     Status: Abnormal   Collection Time: 03/08/20  4:10 AM  Result Value Ref Range   Glucose-Capillary 227 (H) 70 - 99 mg/dL    Comment: Glucose reference range applies only to samples taken after fasting for at least 8 hours.  Lipid panel     Status: Abnormal   Collection Time: 03/08/20  8:02 AM  Result Value Ref Range   Cholesterol 157 0 - 200 mg/dL    Triglycerides 144 <150 mg/dL   HDL 36 (L) >40 mg/dL   Total CHOL/HDL Ratio 4.4 RATIO   VLDL 29 0 - 40 mg/dL   LDL Cholesterol 92 0 - 99 mg/dL    Comment:        Total Cholesterol/HDL:CHD Risk Coronary Heart Disease Risk Table                     Men   Women  1/2 Average Risk   3.4   3.3  Average Risk       5.0   4.4  2 X Average Risk   9.6   7.1  3 X Average Risk  23.4   11.0        Use the calculated Patient Ratio above and the CHD Risk Table to determine the patient's CHD Risk.        ATP III CLASSIFICATION (LDL):  <100     mg/dL   Optimal  100-129  mg/dL   Near or Above                    Optimal  130-159  mg/dL   Borderline  160-189  mg/dL   High  >190     mg/dL   Very High Performed at [facility] [address], [place], [place] [id number]   Basic metabolic panel     Status: Abnormal   Collection Time: 03/08/20  8:02 AM  Result Value Ref Range   Sodium 138 135 - 145 mmol/L   Potassium 3.8 3.5 - 5.1 mmol/L   Chloride 99 98 - 111 mmol/L   CO2 30 22 - 32 mmol/L   Glucose, Bld 180 (H) 70 - 99 mg/dL    Comment: Glucose reference range applies only to samples taken after fasting for at least 8 hours.   BUN 24 (H) 6 - 20 mg/dL   Creatinine, Ser 0.99 0.61 - 1.24 mg/dL   Calcium 9.5 8.9 - 10.3 mg/dL   GFR calc non Af Amer >60 >60 mL/min   GFR calc Af Amer >60 >60 mL/min   Anion gap 9 5 - 15    Comment: Performed at [facility] [address], [place], [place] [address]  Glucose, capillary     Status: Abnormal   Collection Time: 03/08/20  8:04 AM  Result Value Ref Range   Glucose-Capillary 185 (H) 70 - 99 mg/dL    Comment: Glucose reference range applies only to samples taken after fasting for at least 8 hours.  Heparin level (unfractionated)     Status: None   Collection Time: 03/08/20 11:15 AM  Result Value Ref Range   Heparin Unfractionated 0.40 0.30 - 0.70 IU/mL    Comment: (NOTE) If heparin results are below expected values, and patient dosage has   been confirmed, suggest follow up testing of antithrombin III levels. Performed at [facility], [place] [address], [place], [place]  [id number]   Glucose, capillary     Status: Abnormal   Collection Time: 03/08/20 11:45 AM  Result Value Ref Range   Glucose-Capillary 236 (H) 70 - 99 mg/dL    Comment: Glucose reference range applies only to samples taken after fasting for at least 8 hours.  Glucose, capillary     Status: Abnormal   Collection Time: 03/08/20  5:14 PM  Result Value Ref Range   Glucose-Capillary 273 (H) 70 - 99 mg/dL    Comment: Glucose reference range applies only to samples taken after fasting for at least 8 hours.  Heparin level (unfractionated)     Status: None   Collection Time: 03/08/20  6:13 PM  Result Value Ref Range   Heparin Unfractionated 0.56 0.30 - 0.70 IU/mL    Comment: (NOTE) If heparin results are below expected values, and patient dosage has  been confirmed, suggest follow up testing of antithrombin III levels. Performed at [facility], [place] [address], [place], [place] [address]   Glucose, capillary     Status: Abnormal   Collection Time: 03/08/20  8:40 PM  Result Value Ref Range   Glucose-Capillary 335 (H) 70 - 99 mg/dL    Comment: Glucose reference range applies only to samples taken after fasting for at least 8 hours.  Glucose, capillary     Status: Abnormal   Collection Time: 03/08/20 10:25 PM  Result Value Ref Range   Glucose-Capillary 309 (H) 70 - 99 mg/dL    Comment: Glucose reference range applies only to samples taken after fasting for at least 8 hours.  Glucose, capillary     Status: Abnormal   Collection Time: 03/09/20 12:10 AM  Result Value Ref Range   Glucose-Capillary 259 (H) 70 - 99 mg/dL    Comment: Glucose reference range applies only to samples taken after fasting for at least 8 hours.  Heparin level (unfractionated)     Status: None   Collection Time: 03/09/20  3:37 AM  Result Value Ref Range   Heparin Unfractionated  0.55 0.30 - 0.70 IU/mL    Comment: (NOTE) If heparin results are below expected values, and patient dosage has  been confirmed, suggest follow up testing of antithrombin III levels. Performed at [facility], [place] [address] [place], [place] [id number]   Basic metabolic panel     Status: Abnormal   Collection Time: 03/09/20  3:37 AM  Result Value Ref Range   Sodium 137 135 - 145 mmol/L   Potassium 3.3 (L) 3.5 - 5.1 mmol/L   Chloride 96 (L) 98 - 111 mmol/L   CO2 29 22 - 32 mmol/L   Glucose, Bld 115 (H) 70 - 99 mg/dL    Comment: Glucose reference range applies only to samples taken after fasting for at least 8 hours.   BUN 24 (H) 6 - 20 mg/dL   Creatinine, Ser 1.16 0.61 - 1.24 mg/dL   Calcium 9.4 8.9 - 10.3 mg/dL   GFR calc non Af Amer >60 >60 mL/min   GFR calc Af Amer >60 >60 mL/min   Anion gap 12 5 - 15    Comment: Performed at [facility] [address], [place] [address]  CBC     Status: Abnormal   Collection Time: 03/09/20  3:37 AM  Result Value Ref Range   WBC 13.0 (H) 4.0 - 10.5 K/uL   RBC 4.11 (L) 4.22 - 5.81 MIL/uL   Hemoglobin 11.9 (L) 13.0 - 17.0 g/dL  HCT 36.9 (L) 39.0 - 52.0 %   MCV 89.8 80.0 - 100.0 fL   MCH 29.0 26.0 - 34.0 pg   MCHC 32.2 30.0 - 36.0 g/dL   RDW 13.3 11.5 - 15.5 %   Platelets 310 150 - 400 K/uL   nRBC 0.0 0.0 - 0.2 %    Comment: Performed at [facility], [place] [address] [place],  [address]  Magnesium     Status: None   Collection Time: 03/09/20  3:37 AM  Result Value Ref Range   Magnesium 1.7 1.7 - 2.4 mg/dL    Comment: Performed at [facility] [address] [place], [place] [address]  Glucose, capillary     Status: Abnormal   Collection Time: 03/09/20  4:52 AM  Result Value Ref Range   Glucose-Capillary 131 (H) 70 - 99 mg/dL    Comment: Glucose reference range applies only to samples taken after fasting for at least 8 hours.  Glucose, capillary     Status: Abnormal   Collection Time: 03/09/20   8:19 AM  Result Value Ref Range   Glucose-Capillary 136 (H) 70 - 99 mg/dL    Comment: Glucose reference range applies only to samples taken after fasting for at least 8 hours.    Current Facility-Administered Medications  Medication Dose Route Frequency Provider Last Rate Last Admin  . 0.9 %  sodium chloride infusion  250 mL Intravenous PRN Sherren Mocha, MD      . acetaminophen (TYLENOL) tablet 650 mg  650 mg Oral Q6H PRN Damita Lack, MD   650 mg at 03/08/20 1907  . albuterol (PROVENTIL) (2.5 MG/3ML) 0.083% nebulizer solution 2.5 mg  2.5 mg Inhalation Q6H PRN Howerter, Justin B, DO   2.5 mg at 03/06/20 2023  . aspirin EC tablet 81 mg  81 mg Oral Daily Howerter, Justin B, DO   81 mg at 03/09/20 0841  . atorvastatin (LIPITOR) tablet 80 mg  80 mg Oral QHS Sande Rives E, PA-C   80 mg at 03/08/20 2219  . ferrous sulfate tablet 325 mg  325 mg Oral Q breakfast Howerter, Justin B, DO   325 mg at 03/09/20 0840  . furosemide (LASIX) injection 60 mg  60 mg Intravenous BID Arnoldo Lenis, MD   60 mg at 03/09/20 0845  . gabapentin (NEURONTIN) capsule 900 mg  900 mg Oral BID Howerter, Justin B, DO   900 mg at 03/09/20 0840  . heparin ADULT infusion 100 units/mL (25000 units/286mL sodium chloride 0.45%)  2,000 Units/hr Intravenous Continuous Franky Macho, RPH 20 mL/hr at 03/09/20 0232 2,000 Units/hr at 03/09/20 0232  . insulin aspart (novoLOG) injection 0-20 Units  0-20 Units Subcutaneous Q4H Shah, Pratik D, DO   3 Units at 03/09/20 0845  . insulin glargine (LANTUS) injection 14 Units  14 Units Subcutaneous BID Damita Lack, MD   14 Units at 03/09/20 0854  . metoprolol succinate (TOPROL-XL) 24 hr tablet 100 mg  100 mg Oral Daily Arnoldo Lenis, MD   100 mg at 03/09/20 0841  . morphine 2 MG/ML injection 1 mg  1 mg Intravenous Q4H PRN Lynetta Mare, MD   1 mg at 03/06/20 2014  . nitroGLYCERIN (NITROSTAT) SL tablet 0.4 mg  0.4 mg Sublingual Q5 min PRN Lynetta Mare, MD       . nitroGLYCERIN 50 mg in dextrose 5 % 250 mL (0.2 mg/mL) infusion  0-200 mcg/min Intravenous Titrated Branch, Alphonse Guild, MD  6 mL/hr at 03/09/20 0851 20 mcg/min at 03/09/20 0851  . ondansetron (ZOFRAN) injection 4 mg  4 mg Intravenous Q6H PRN Sherren Mocha, MD      . polyethylene glycol (MIRALAX / GLYCOLAX) packet 17 g  17 g Oral Daily PRN Amin, Ankit Chirag, MD      . senna-docusate (Senokot-S) tablet 2 tablet  2 tablet Oral QHS PRN Amin, Ankit Chirag, MD      . sodium chloride flush (NS) 0.9 % injection 3 mL  3 mL Intravenous Q12H Howerter, Justin B, DO   3 mL at 03/09/20 0846  . sodium chloride flush (NS) 0.9 % injection 3 mL  3 mL Intravenous Q12H Erma Heritage, PA-C   3 mL at 03/07/20 [id number]  . sodium chloride flush (NS) 0.9 % injection 3 mL  3 mL Intravenous Q12H Sherren Mocha, MD   3 mL at 03/08/20 2228  . sodium chloride flush (NS) 0.9 % injection 3 mL  3 mL Intravenous PRN Sherren Mocha, MD      . spironolactone (ALDACTONE) tablet 25 mg  25 mg Oral Daily Sande Rives E, PA-C   25 mg at 03/09/20 0841  . tamsulosin (FLOMAX) capsule 0.4 mg  0.4 mg Oral Daily Howerter, Justin B, DO   0.4 mg at 03/09/20 0840    Musculoskeletal: Strength & Muscle Tone: decreased Gait & Station: unable to assess Patient leans: N/A  Psychiatric Specialty Exam: Physical Exam  Nursing note and vitals reviewed. Constitutional: He is oriented to person, place, and time. He appears well-developed.  HENT:  Head: Normocephalic.  Cardiovascular: Normal rate.  Respiratory: Effort normal.  Musculoskeletal:     Cervical back: Normal range of motion.  Neurological: He is alert and oriented to person, place, and time.  Psychiatric: His speech is normal and behavior is normal. Judgment and thought content normal. Cognition and memory are normal. He exhibits a depressed mood.    Review of Systems  Constitutional: Negative.   HENT: Negative.   Eyes: Negative.   Respiratory: Negative.    Cardiovascular: Negative.   Gastrointestinal: Negative.   Genitourinary: Negative.   Musculoskeletal: Negative.   Skin: Negative.   Neurological: Negative.     Blood pressure (!) 113/45, pulse 64, temperature (!) 97.5 F (36.4 C), temperature source Oral, resp. rate 20, height 5\' 9"  (1.753 m), weight 121.7 kg, SpO2 98 %.Body mass index is 39.64 kg/m.  General Appearance: Disheveled  Eye Contact:  Minimal  Speech:  Clear and Coherent and Slow  Volume:  Normal  Mood:  Depressed  Affect:  Appropriate and Depressed  Thought Process:  Coherent, Goal Directed and Descriptions of Associations: Intact  Orientation:  Full (Time, Place, and Person)  Thought Content:  Logical and Rumination  Suicidal Thoughts:  No  Homicidal Thoughts:  No  Memory:  Immediate;   Good Recent;   Good Remote;   Good  Judgement:  Good  Insight:  Fair  Psychomotor Activity:  Normal  Concentration:  Concentration: Good and Attention Span: Good  Recall:  Good  Fund of Knowledge:  Good  Language:  Good  Akathisia:  No  Handed:  Right  AIMS (if indicated):     Assets:  Communication Skills Desire for Improvement Financial Resources/Insurance Housing Intimacy Leisure Time  ADL's:  Unable to assess   Cognition:  WNL  Sleep:        Treatment Plan Summary: Pateint discussed with Dr. Dwyane Dee.  Patient may be experiencing ruminations related to depressed mood. Medication management  recommend consider decrease Effexor XR to 100 mg p.o. daily and initiate Prozac 10 mg p.o. daily. Venlafaxine use with caution in patients with cardiac impairment.     Disposition: No evidence of imminent risk to self or others at present.   Patient does not meet criteria for psychiatric inpatient admission. Supportive therapy provided about ongoing stressors. Discussed crisis plan, support from social network, calling 911, coming to the Emergency Department, and calling Suicide Hotline.  Emmaline Kluver, FNP 03/09/2020 9:37  AM

## 2020-03-09 NOTE — Evaluation (Signed)
Physical Therapy Evaluation Patient Details Name: Shane Chambers MRN: [id number] DOB: 1961/08/05 Today's Date: 03/09/2020   History of Present Illness  59 year old with history of DM2, peripheral neuropathy, HTN, BPH, presented to [facility] with acute diastolic CHF, elevated troponin.  Seen by cardiology and transferred to [facility] for further management.  LHC 4/28 performed showed severe multivessel CAD with elevated filling pressures, poor candidate for CABG, high risk for PCI.  Clinical Impression  Pt admitted with above diagnosis. Pt was able to ambulate with RW with min assist of 2 for safety as he has neuropathy and knee instability. Only able to withstand short distances per pt.  DOE 3/4.  Fatigues quickly.  Will follow acutely and feel that SNF is best option for pt.   Pt currently with functional limitations due to the deficits listed below (see PT Problem List). Pt will benefit from skilled PT to increase their independence and safety with mobility to allow discharge to the venue listed below.      Follow Up Recommendations SNF;Supervision/Assistance - 24 hour    Equipment Recommendations  None recommended by PT    Recommendations for Other Services       Precautions / Restrictions Precautions Precautions: Fall Restrictions Weight Bearing Restrictions: No      Mobility  Bed Mobility Overal bed mobility: Independent             General bed mobility comments: sitting EOB on Arrival  Transfers Overall transfer level: Needs assistance Equipment used: Rolling walker (2 wheeled) Transfers: Sit to/from Stand Sit to Stand: Min assist;+2 safety/equipment;From elevated surface         General transfer comment: Pt needed cues for hand placement. Steadying assist once on feet.    Ambulation/Gait Ambulation/Gait assistance: Min assist;+2 safety/equipment Gait Distance (Feet): 20 Feet Assistive device: Rolling walker (2 wheeled) Gait  Pattern/deviations: Step-to pattern;Decreased stride length;Shuffle;Wide base of support;Drifts right/left;Antalgic   Gait velocity interpretation: <1.31 ft/sec, indicative of household ambulator General Gait Details: Pt was able to ambulate around the bed to the chair with min assist of 2, +2 for chair follow as pt states he has neuropathy and knee instability.  No LOB and made it around bed today.  Pt slow and steady overall with rW short distance.   Stairs            Wheelchair Mobility    Modified Rankin (Stroke Patients Only)       Balance Overall balance assessment: Needs assistance Sitting-balance support: No upper extremity supported;Feet supported Sitting balance-Leahy Scale: Fair     Standing balance support: Bilateral upper extremity supported;During functional activity Standing balance-Leahy Scale: Poor Standing balance comment: Pt relies on UE support for balance.                              Pertinent Vitals/Pain Pain Assessment: No/denies pain    Home Living Family/patient expects to be discharged to:: Private residence Living Arrangements: Parent;Other relatives Available Help at Discharge: Family;Available PRN/intermittently Type of Home: Mobile home Home Access: Stairs to enter Entrance Stairs-Rails: Right(not sturdy) Entrance Stairs-Number of Steps: 5-6 Home Layout: One level Home Equipment: Walker - 2 wheels;Cane - quad;Bedside commode;Wheelchair - manual Additional Comments: bird bathes with microwaved water per pt as water doesnt work at home    Prior Function Level of Independence: Needs assistance   Gait / Transfers Assistance Needed: household ambulator with SPC PRN, uses electric scooters where available when  shopping  ADL's / Homemaking Assistance Needed: assisted by family  Comments: Pt states he couldnt transport wheelchair so he couldnt take it when out. Used the RW at times out in community.  Pt states the last time he  showered was in January.   Pt also reports he does not have a bed.       Hand Dominance   Dominant Hand: Right    Extremity/Trunk Assessment   Upper Extremity Assessment Upper Extremity Assessment: Defer to OT evaluation    Lower Extremity Assessment Lower Extremity Assessment: Generalized weakness    Cervical / Trunk Assessment Cervical / Trunk Assessment: Normal  Communication   Communication: No difficulties  Cognition Arousal/Alertness: Awake/alert Behavior During Therapy: WFL for tasks assessed/performed Overall Cognitive Status: Within Functional Limits for tasks assessed                                        General Comments General comments (skin integrity, edema, etc.): 96% with 3LO2    Exercises General Exercises - Lower Extremity Long Arc Quad: AROM;Both;5 reps;Seated   Assessment/Plan    PT Assessment Patient needs continued PT services  PT Problem List Decreased activity tolerance;Decreased balance;Decreased mobility;Decreased knowledge of use of DME;Decreased safety awareness;Decreased knowledge of precautions;Cardiopulmonary status limiting activity       PT Treatment Interventions DME instruction;Gait training;Functional mobility training;Therapeutic activities;Therapeutic exercise;Balance training;Patient/family education    PT Goals (Current goals can be found in the Care Plan section)  Acute Rehab PT Goals Patient Stated Goal: to get stronger PT Goal Formulation: With patient Time For Goal Achievement: 03/23/20 Potential to Achieve Goals: Good    Frequency Min 2X/week   Barriers to discharge Decreased caregiver support Issues per chart about home    Co-evaluation PT/OT/SLP Co-Evaluation/Treatment: Yes Reason for Co-Treatment: Complexity of the patient's impairments (multi-system involvement);For patient/therapist safety PT goals addressed during session: Mobility/safety with mobility         AM-PAC PT "6 Clicks"  Mobility  Outcome Measure Help needed turning from your back to your side while in a flat bed without using bedrails?: None Help needed moving from lying on your back to sitting on the side of a flat bed without using bedrails?: None Help needed moving to and from a bed to a chair (including a wheelchair)?: A Little Help needed standing up from a chair using your arms (e.g., wheelchair or bedside chair)?: A Lot Help needed to walk in hospital room?: A Lot Help needed climbing 3-5 steps with a railing? : A Lot 6 Click Score: 17    End of Session Equipment Utilized During Treatment: Gait belt;Oxygen Activity Tolerance: Patient limited by fatigue Patient left: in chair;with call bell/phone within reach;with chair alarm set Nurse Communication: Mobility status PT Visit Diagnosis: Unsteadiness on feet (R26.81);Muscle weakness (generalized) (M62.81)    Time: [id number] PT Time Calculation (min) (ACUTE ONLY): 24 min   Charges:   PT Evaluation $PT Eval Moderate Complexity: 1 Mod          Muaz Shorey W,PT Acute Rehabilitation Services Pager:  [phone]  Office:  [place] 03/09/2020, 11:33 AM

## 2020-03-09 NOTE — NC FL2 (Signed)
[place] LEVEL OF CARE SCREENING TOOL     IDENTIFICATION  Patient Name: Shane Chambers Birthdate: Aug 13, 1961 Sex: male Admission Date (Current Location): 03/05/2020  [facility] [place] Number:  Herbalist and Address:  [facility] [place] [address] [place], [place] [address]      Provider Number: [id number]  Attending Physician Name and Address:  Damita Lack, MD  Relative Name and Phone Number:  Estill Bamberg [phone]    Current Level of Care: Hospital Recommended Level of Care: [place] Prior Approval Number:    Date Approved/Denied: 03/09/20 PASRR Number: [id number]  Discharge Plan: SNF    Current Diagnoses: Patient Active Problem List   Diagnosis Date Noted  . Ischemic cardiomyopathy   . Coronary artery disease   . Acute on chronic combined systolic and diastolic CHF (congestive heart failure) ([place]) 03/05/2020  . Acute hyponatremia 03/05/2020  . Acute respiratory failure with hypoxia ([place]) 12/21/2019  . Acute respiratory failure ([place]) 12/20/2019  . Severe Vitamin D deficiency 12/02/2019  . CAP (community acquired pneumonia) 12/01/2019  . Hypokalemia 12/01/2019  . Hyperlipidemia 12/01/2019  . BPH (benign prostatic hyperplasia) 12/01/2019  . Normocytic anemia 12/01/2019  . Pneumonia due to COVID-19 virus 12/01/2019  . Hypoxia   . SOB (shortness of breath) 11/16/2019  . Sepsis ([place]) 11/16/2019  . Hypertension   . Type 2 diabetes mellitus ([place])   . Depression   . Acute bronchitis with bronchospasm 11/15/2019    Orientation RESPIRATION BLADDER Height & Weight     Self, Time, Situation, Place  O2(Nasal Cannula 3 liters) Continent Weight: 268 lb 6.4 oz (121.7 kg) Height:  5\' 9"  (175.3 cm)  BEHAVIORAL SYMPTOMS/MOOD NEUROLOGICAL BOWEL NUTRITION STATUS      Continent Diet(See Discharge Summary)  AMBULATORY STATUS COMMUNICATION OF NEEDS Skin   Limited Assist Verbally Other (Comment)(Approp for  ethnicity,Dry, itching, Abdomen Buttocks Back Foot Leg Right left anterior post bilateral cleansed non-tenting)                       Personal Care Assistance Level of Assistance  Bathing, Feeding, Dressing Bathing Assistance: Limited assistance Feeding assistance: Independent(able to feed self) Dressing Assistance: Limited assistance     Functional Limitations Info  Sight, Hearing, Speech Sight Info: Impaired Hearing Info: Adequate Speech Info: Adequate    SPECIAL CARE FACTORS FREQUENCY  PT (By licensed PT), OT (By licensed OT)     PT Frequency: 5x min weekly OT Frequency: 5x min weekly            Contractures Contractures Info: Not present    Additional Factors Info  Code Status, Allergies Code Status Info: FULL Allergies Info: Ace Inhibitors,[name],Penicillins           Current Medications (03/09/2020):  This is the current hospital active medication list Current Facility-Administered Medications  Medication Dose Route Frequency Provider Last Rate Last Admin  . 0.9 %  sodium chloride infusion  250 mL Intravenous PRN Sherren Mocha, MD      . acetaminophen (TYLENOL) tablet 650 mg  650 mg Oral Q6H PRN Damita Lack, MD   650 mg at 03/09/20 1128  . albuterol (PROVENTIL) (2.5 MG/3ML) 0.083% nebulizer solution 2.5 mg  2.5 mg Inhalation Q6H PRN Howerter, Justin B, DO   2.5 mg at 03/06/20 2023  . aspirin EC tablet 81 mg  81 mg Oral Daily Howerter, Justin B, DO   81 mg at 03/09/20 0841  .  atorvastatin (LIPITOR) tablet 80 mg  80 mg Oral QHS Sande Rives E, PA-C   80 mg at 03/08/20 2219  . ferrous sulfate tablet 325 mg  325 mg Oral Q breakfast Howerter, Justin B, DO   325 mg at 03/09/20 0840  . furosemide (LASIX) injection 40 mg  40 mg Intravenous BID Troy Sine, MD      . gabapentin (NEURONTIN) capsule 900 mg  900 mg Oral BID Howerter, Justin B, DO   900 mg at 03/09/20 0840  . heparin ADULT infusion 100 units/mL (25000 units/243mL sodium chloride  0.45%)  2,000 Units/hr Intravenous Continuous Franky Macho, RPH 20 mL/hr at 03/09/20 0232 2,000 Units/hr at 03/09/20 0232  . insulin aspart (novoLOG) injection 0-20 Units  0-20 Units Subcutaneous Q4H Shah, Pratik D, DO   7 Units at 03/09/20 1127  . insulin glargine (LANTUS) injection 14 Units  14 Units Subcutaneous BID Damita Lack, MD   14 Units at 03/09/20 0854  . metoprolol succinate (TOPROL-XL) 24 hr tablet 100 mg  100 mg Oral Daily Arnoldo Lenis, MD   100 mg at 03/09/20 0841  . morphine 2 MG/ML injection 1 mg  1 mg Intravenous Q4H PRN Lynetta Mare, MD   1 mg at 03/06/20 2014  . nitroGLYCERIN (NITROSTAT) SL tablet 0.4 mg  0.4 mg Sublingual Q5 min PRN Lynetta Mare, MD      . nitroGLYCERIN 50 mg in dextrose 5 % 250 mL (0.2 mg/mL) infusion  0-200 mcg/min Intravenous Titrated Arnoldo Lenis, MD 6 mL/hr at 03/09/20 0851 20 mcg/min at 03/09/20 0851  . ondansetron (ZOFRAN) injection 4 mg  4 mg Intravenous Q6H PRN Sherren Mocha, MD      . polyethylene glycol (MIRALAX / GLYCOLAX) packet 17 g  17 g Oral Daily PRN Amin, Ankit Chirag, MD      . senna-docusate (Senokot-S) tablet 2 tablet  2 tablet Oral QHS PRN Amin, Ankit Chirag, MD      . sodium chloride flush (NS) 0.9 % injection 3 mL  3 mL Intravenous Q12H Howerter, Justin B, DO   3 mL at 03/09/20 0846  . sodium chloride flush (NS) 0.9 % injection 3 mL  3 mL Intravenous Q12H Erma Heritage, PA-C   3 mL at 03/07/20 [id number]  . sodium chloride flush (NS) 0.9 % injection 3 mL  3 mL Intravenous Q12H Sherren Mocha, MD   3 mL at 03/08/20 2228  . sodium chloride flush (NS) 0.9 % injection 3 mL  3 mL Intravenous PRN Sherren Mocha, MD      . spironolactone (ALDACTONE) tablet 25 mg  25 mg Oral Daily Sande Rives E, PA-C   25 mg at 03/09/20 0841  . tamsulosin (FLOMAX) capsule 0.4 mg  0.4 mg Oral Daily Howerter, Justin B, DO   0.4 mg at 03/09/20 0840     Discharge Medications: Please see discharge summary for a list of  discharge medications.  Relevant Imaging Results:  Relevant Lab Results:   Additional Information [id number]  Trula Ore, LCSWA

## 2020-03-09 NOTE — Progress Notes (Signed)
Progress Note  Patient Name: Shane Chambers Date of Encounter: 03/09/2020  Primary Cardiologist: [place], Dr. Carlyle Dolly  Subjective   Breathing better but still mild shortness of breath  Inpatient Medications    Scheduled Meds: . aspirin EC  81 mg Oral Daily  . atorvastatin  80 mg Oral QHS  . ferrous sulfate  325 mg Oral Q breakfast  . furosemide  60 mg Intravenous BID  . gabapentin  900 mg Oral BID  . insulin aspart  0-20 Units Subcutaneous Q4H  . insulin glargine  14 Units Subcutaneous BID  . metoprolol  100 mg Oral Daily  . sodium chloride flush  3 mL Intravenous Q12H  . sodium chloride flush  3 mL Intravenous Q12H  . sodium chloride flush  3 mL Intravenous Q12H  . spironolactone  25 mg Oral Daily  . tamsulosin  0.4 mg Oral Daily   Continuous Infusions: . sodium chloride    . heparin 2,000 Units/hr (03/09/20 0232)  . nitroGLYCERIN 20 mcg/min (03/09/20 0851)   PRN Meds: sodium chloride, acetaminophen, albuterol, morphine injection, nitroGLYCERIN, ondansetron (ZOFRAN) IV, polyethylene glycol, senna-docusate, sodium chloride flush   Vital Signs    Vitals:   03/09/20 0820 03/09/20 0840 03/09/20 1116 03/09/20 1228  BP: (!) 83/41 (!) 113/45 96/61   Pulse: 60 64 73   Resp:   20   Temp: (!) 97.5 F (36.4 C)  (!) 100.9 F (38.3 C) 98 F (36.7 C)  TempSrc: Oral  Oral Oral  SpO2: 98%  92%   Weight:      Height:        Intake/Output Summary (Last 24 hours) at 03/09/2020 1315 Last data filed at 03/09/2020 1029 Gross per 24 hour  Intake 222 ml  Output 3575 ml  Net -3353 ml    I/O since admission: -12,468  Filed Weights   03/07/20 0503 03/08/20 0316 03/09/20 0450  Weight: 134.4 kg 132 kg 121.7 kg    Telemetry    Sinus rhythm 86 bpm- Personally Reviewed  ECG    ECG (independently read by me): Sinus rhythm at 66 bpm.  Inferior Q waves.  T wave inversion in leads I aVL, V3 through V6.  QTc interval 469 ms.  Physical Exam   BP 96/61   Pulse 73    Temp 98 F (36.7 C) (Oral)   Resp 20   Ht 5\' 9"  (1.753 m)   Wt 121.7 kg   SpO2 92%   BMI 39.64 kg/m  General: Alert, oriented, no distress.  Skin: normal turgor, no rashes, warm and dry HEENT: Normocephalic, atraumatic. Pupils equal round and reactive to light; sclera anicteric; extraocular muscles intact;  Nose without nasal septal hypertrophy Mouth/Parynx benign;  Neck: Thick neck; No JVD, no carotid bruits; normal carotid upstroke Lungs: clear to ausculatation and percussion; no wheezing or rales Chest wall: without tenderness to palpitation Heart: PMI not displaced, RRR, s1 s2 normal, 1/6 systolic murmur, no diastolic murmur, no rubs, gallops, thrills, or heaves Abdomen: soft, nontender; no hepatosplenomehaly, BS+; abdominal aorta nontender and not dilated by palpation. Back: no CVA tenderness Pulses 2+ Musculoskeletal: full range of motion, normal strength, no joint deformities Extremities: no clubbing cyanosis or edema, Homan's sign negative  Neurologic: grossly nonfocal; Cranial nerves grossly wnl Psychologic: Normal mood and affect   Labs    Chemistry Recent Labs  Lab 03/05/20 1330 03/06/20 0412 03/07/20 0412 03/07/20 0412 03/07/20 1633 03/08/20 0802 03/09/20 0337  NA 131*   < > 134*   < >  137  138 138 137  K 4.5   < > 3.4*   < > 3.5  3.6 3.8 3.3*  CL 94*   < > 93*  --   --  99 96*  CO2 26   < > 28  --   --  30 29  GLUCOSE 311*   < > 228*  --   --  180* 115*  BUN 22*   < > 36*  --   --  24* 24*  CREATININE 0.88   < > 1.07  --   --  0.99 1.16  CALCIUM 9.2   < > 9.4  --   --  9.5 9.4  PROT 7.4  --   --   --   --   --   --   ALBUMIN 3.7  --   --   --   --   --   --   AST 36  --   --   --   --   --   --   ALT 21  --   --   --   --   --   --   ALKPHOS 113  --   --   --   --   --   --   BILITOT 1.1  --   --   --   --   --   --   GFRNONAA >60   < > >60  --   --  >60 >60  GFRAA >60   < > >60  --   --  >60 >60  ANIONGAP 11   < > 13  --   --  9 12   < > = values  in this interval not displayed.     Hematology Recent Labs  Lab 03/07/20 0412 03/07/20 0412 03/07/20 1633 03/08/20 0253 03/09/20 0337  WBC 18.0*  --   --  13.6* 13.0*  RBC 4.00*  --   --  4.09* 4.11*  HGB 11.3*   < > 12.2*  12.6* 11.9* 11.9*  HCT 36.0*   < > 36.0*  37.0* 37.0* 36.9*  MCV 90.0  --   --  90.5 89.8  MCH 28.3  --   --  29.1 29.0  MCHC 31.4  --   --  32.2 32.2  RDW 13.4  --   --  13.5 13.3  PLT 321  --   --  325 310   < > = values in this interval not displayed.   HS TROP: 3835 > 4590 > 4752 > 4111 > 3519  Cardiac EnzymesNo results for input(s): TROPONINI in the last 168 hours. No results for input(s): TROPIPOC in the last 168 hours.   BNP Recent Labs  Lab 03/05/20 1330  BNP 463.0*     DDimer  Recent Labs  Lab 03/05/20 1330  DDIMER 1.08*     Lipid Panel     Component Value Date/Time   CHOL 157 03/08/2020 0802   TRIG 144 03/08/2020 0802   HDL 36 (L) 03/08/2020 0802   CHOLHDL 4.4 03/08/2020 0802   VLDL 29 03/08/2020 0802   [place] 92 03/08/2020 0802     Radiology    CARDIAC CATHETERIZATION  Result Date: 03/07/2020  Prox RCA lesion is 99% stenosed.  Prox RCA to Mid RCA lesion is 40% stenosed.  Dist RCA lesion is 30% stenosed.  Prox LAD to Mid LAD lesion is 80% stenosed.  1st Diag lesion is  60% stenosed.  Mid LM to Dist LM lesion is 40% stenosed.  Prox Cx to Mid Cx lesion is 95% stenosed.  1. 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 Moderately elevated LV/right heart filling pressures c/w known diagnosis of heart failure 3. Known severe LV dysfunction by echo Recommend: review revascularization options. Pt has surgical anatomy, but not sure he will be a candidate for CABG. High-risk multivessel PCI could be considered if he is not a candidate for surgery.    Cardiac Studies   Echocardiogram  03/06/2020: Impressions: 1. Left ventricular ejection fraction, by estimation, is 20 to 25%. The  left ventricle has severely decreased function. The left ventricle  demonstrates global hypokinesis. There is moderate left ventricular  hypertrophy. Left ventricular diastolic  parameters are consistent with Grade III diastolic dysfunction  (restrictive).  2. Right ventricular systolic function is low normal. The right  ventricular size is normal. Tricuspid regurgitation signal is inadequate  for assessing PA pressure.  3. Left atrial size was mildly dilated.  4. The mitral valve is grossly normal. Mild mitral valve regurgitation.  5. The aortic valve was not well visualized. Aortic valve regurgitation  is not visualized. Mild aortic valve sclerosis is present, with no  evidence of aortic valve stenosis.  6. IVC dilated but respirophasic change not well visualized.  _______________  Right/Left Heart Catheterization 03/07/2020:  Prox RCA lesion is 99% stenosed.  Prox RCA to Mid RCA lesion is 40% stenosed.  Dist RCA lesion is 30% stenosed.  Prox LAD to Mid LAD lesion is 80% stenosed.  1st Diag lesion is 60% stenosed.  Mid LM to Dist LM lesion is 40% stenosed.  Prox Cx to Mid Cx lesion is 95% stenosed.  1. 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 Moderately elevated LV/right heart filling pressures c/w known diagnosis of heart failure 3. Known severe LV dysfunction by echo  Recommend: review revascularization options. Patient has surgical anatomy, but not sure he will be a candidate for CABG. High-risk multivessel PCI could be considered if he is not a candidate for surgery.       Patient Profile     59 y.o. male with a history of hypertension, hyperlipidemia, type 2 diabetes mellitus, and depression who initially presented to [facility]  on 03/05/2020 with shortness of breath and atypical chest pain. Ruled in for NSTEMI and found to have an EF of 20-25% on Echo. Transferred to [facility] for right/left heart catheterization  Assessment & Plan    1.  Non-STEMI: Peak troponin 4590.  Severe multivessel CAD noted at catheterization with good distal targets.  I have recommended a surgical evaluation for consideration of CABG.  Patient has had significant difficulty with mobility prior to his hospitalization but upon further questioning this may be secondary to marked exertional dyspnea contributed by his severe ischemic cardiomyopathy.  Her abdomen is to see for surgical consultation.  2.  Ischemic cardiomyopathy: Echo in February 2021 showed EF 50 to 55%.  Echo from March 06, 2020 shows EF 40 to 25%.  Spironolactone added to regimen yesterday.  Reported history of possible throat swelling with enalapril in the past so will not start ARB.  As blood pressure allows, add hydralazine/nitrate therapy.  We will decrease IV furosemide from 60 mg twice daily  down to 40 mg twice daily today and probable transition to oral therapy tomorrow.  3.  Hyperlipidemia: LDL 92.  Increase atorvastatin to 80 mg daily.  4.  Type 2 diabetes mellitus: With significant LV dysfunction, would benefit from initiation of Jard[place] or [place].  5.  Obesity: Weight reduction and proper diet is essential.  Patient admits to poor diet in the past.  6.  Hypokalemia: Potassium 3.3 earlier today.  Received 40 mEq of potassium.  We will give an additional dose since patient is on IV for Lasix today.  Follow-up in a.m.  Signed, Troy Sine, MD, [facility] 03/09/2020, 1:15 PM

## 2020-03-09 NOTE — Progress Notes (Signed)
Pharmacist Heart Failure Core Measure Documentation  Assessment: Shane Chambers has an EF documented as 20-25% on 03/06/20 by Echo.  Rationale: Heart failure patients with left ventricular systolic dysfunction (LVSD) and an EF < 40% should be prescribed an angiotensin converting enzyme inhibitor (ACEI) or angiotensin receptor blocker (ARB) at discharge unless a contraindication is documented in the medical record.  This patient is not currently on an ACEI or ARB for HF.  This note is being placed in the record in order to provide documentation that a contraindication to the use of these agents is present for this encounter.  ACE Inhibitor or Angiotensin Receptor Blocker is contraindicated (specify all that apply)  []   ACEI allergy AND ARB allergy [x]   Angioedema  (history of throat swelling with ACE inhibitor) []   Moderate or severe aortic stenosis []   Hyperkalemia []   Hypotension []   Renal artery stenosis []   Worsening renal function, preexisting renal disease or dysfunction   [name], PharmD Clinical Pharmacist **Pharmacist phone directory can now be found on [email or web address] ([email or web address]).  Listed under [place].

## 2020-03-09 NOTE — TOC Progression Note (Signed)
Transition of Care [facility]) - Progression Note    Patient Details  Name: Shane Chambers MRN: [id number] Date of Birth: [birth year]  Transition of Care [facility]) CM/SW [place], [place] Phone Number: 03/09/2020, 2:16 PM  Clinical Narrative:      Spoke with patient at bedside. Patient is agreeable to SNF placement. Patient agreed to [place] faxing out initial referral to [place] and [place] area.  Pending bed offers.   Expected Discharge Plan: Skilled Nursing Facility Barriers to Discharge: Continued Medical Work up  Expected Discharge Plan and Services Expected Discharge Plan: [place] In-house Referral: Clinical Social Work     Living arrangements for the past 2 months: Mobile Home                                       Social Determinants of Health (SDOH) Interventions    Readmission Risk Interventions Readmission Risk Prevention Plan 03/07/2020 12/06/2019  Transportation Screening Complete Complete  PCP or Specialist Appt within 3-5 Days - Not Complete  HRI or [place] - Not Complete  Social Work Consult for [place] Planning/Counseling - Not Complete  Palliative Care Screening - Not Complete  Medication Review Press photographer) Complete Complete  Some recent data might be hidden

## 2020-03-09 NOTE — Progress Notes (Addendum)
Inpatient Diabetes Program Recommendations  AACE/ADA: New Consensus Statement on Inpatient Glycemic Control (2015)  Target Ranges:  Prepandial:   less than 140 mg/dL      Peak postprandial:   less than 180 mg/dL (1-2 hours)      Critically ill patients:  140 - 180 mg/dL   Lab Results  Component Value Date   GLUCAP 246 (H) 03/09/2020   HGBA1C 10.2 (H) 03/06/2020    Review of Glycemic Control Results for Shane Chambers, Shane Chambers (MRN [id number]) as of 03/09/2020 11:47  Ref. Range 03/08/2020 08:04 03/08/2020 11:45 03/08/2020 17:14 03/08/2020 20:40 03/08/2020 22:25 03/09/2020 00:10 03/09/2020 04:52 03/09/2020 08:19 03/09/2020 11:18  Glucose-Capillary Latest Ref Range: 70 - 99 mg/dL 185 (H) 236 (H) 273 (H) 335 (H) 309 (H) 259 (H) 131 (H) 136 (H) 246 (H)    Diabetes history: DM 2 Outpatient Diabetes medications: 70/30 65 units bid, Metformin 1000 mg bid Current orders for Inpatient glycemic control:  Lantus 14 units bid Novolog 0-20 units Q4 hours  Inpatient Diabetes Program Recommendations:    Fasting glucose looks better  -  Add Novolog meal coverage 5 units tid if eating >50% of meals.   Thanks,  Tama Headings RN, MSN, BC-ADM Inpatient Diabetes Coordinator Team Pager [phone] (8a-5p)

## 2020-03-09 NOTE — Evaluation (Signed)
Occupational Therapy Evaluation Patient Details Name: Shane Chambers MRN: [id number] DOB: 04-06-1961 Today's Date: 03/09/2020    History of Present Illness 59 year old with history of DM2, peripheral neuropathy, HTN, BPH, presented to [facility] with acute diastolic CHF, elevated troponin.  Seen by cardiology and transferred to [facility] for further management.  LHC 4/28 performed showed severe multivessel CAD with elevated filling pressures, poor candidate for CABG, high risk for PCI.   Clinical Impression   PTA patient reports independent with basic ADLs, basin bathing only, completing mobility with cane/RW. Patient admitted for above and limited by problem list below, including generalized weakness, decreased activity tolerance and impaired balance.  Patient currently requires min assist +2 for LB ADLs safety, min assist +2 in room mobility for safety, and setup for UB ADLs.  He fatigues quickly during activity and is fearful of falling.  He will benefit from continued OT services while admitted and after dc at SNF level to optimize independence, safety with ADLs mobility.  Will follow.     Follow Up Recommendations  SNF;Supervision/Assistance - 24 hour    Equipment Recommendations  Other (comment)(TBD at next venue of care)    Recommendations for Other Services       Precautions / Restrictions Precautions Precautions: Fall Restrictions Weight Bearing Restrictions: No      Mobility Bed Mobility Overal bed mobility: Independent             General bed mobility comments: sitting EOB on Arrival  Transfers Overall transfer level: Needs assistance Equipment used: Rolling walker (2 wheeled) Transfers: Sit to/from Stand Sit to Stand: Min assist;+2 safety/equipment;From elevated surface         General transfer comment: Pt needed cues for hand placement. Steadying assist once on feet.      Balance Overall balance assessment: Needs  assistance Sitting-balance support: No upper extremity supported;Feet supported Sitting balance-Leahy Scale: Fair     Standing balance support: Bilateral upper extremity supported;During functional activity Standing balance-Leahy Scale: Poor Standing balance comment: Pt relies on UE support for balance.                            ADL either performed or assessed with clinical judgement   ADL Overall ADL's : Needs assistance/impaired     Grooming: Set up;Sitting   Upper Body Bathing: Set up;Sitting   Lower Body Bathing: Minimal assistance;Sit to/from stand   Upper Body Dressing : Minimal assistance;Sitting   Lower Body Dressing: Minimal assistance;+2 for safety/equipment;Sit to/from stand Lower Body Dressing Details (indicate cue type and reason): able to don socks with increased time, min assist +2 standing for safety Toilet Transfer: Minimal assistance;+2 for safety/equipment;+2 for physical assistance;Ambulation;RW Toilet Transfer Details (indicate cue type and reason): simulated to recliner          Functional mobility during ADLs: Minimal assistance;+2 for safety/equipment;Rolling walker;Cueing for safety General ADL Comments: pt limited by weakness, decreased activity tolerance     Vision         Perception     Praxis      Pertinent Vitals/Pain Pain Assessment: No/denies pain     Hand Dominance Right   Extremity/Trunk Assessment Upper Extremity Assessment Upper Extremity Assessment: Generalized weakness   Lower Extremity Assessment Lower Extremity Assessment: Defer to PT evaluation   Cervical / Trunk Assessment Cervical / Trunk Assessment: Normal   Communication Communication Communication: No difficulties   Cognition Arousal/Alertness: Awake/alert Behavior During Therapy: [facility] for  tasks assessed/performed Overall Cognitive Status: Within Functional Limits for tasks assessed                                     General  Comments  on 3L supplemental O2 via [place], VSS    Exercises Exercises: General Lower Extremity General Exercises - Lower Extremity Long Arc Quad: AROM;Both;5 reps;Seated   Shoulder Instructions      Home Living Family/patient expects to be discharged to:: Private residence Living Arrangements: Parent;Other relatives Available Help at Discharge: Family;Available PRN/intermittently Type of Home: Mobile home Home Access: Stairs to enter Entrance Stairs-Number of Steps: 5-6 Entrance Stairs-Rails: Right(not sturdy) Home Layout: One level     Bathroom Shower/Tub: (shower doesn't work )   Biochemist, clinical: Standard     Home Equipment: Environmental consultant - 2 wheels;Cane - quad;Bedside commode;Wheelchair - manual   Additional Comments: bird bathes with microwaved water per pt as water doesnt work at home      Prior Functioning/Environment Level of Independence: Needs Product/process development scientist / Transfers Assistance Needed: household ambulator with SPC PRN, uses electric scooters where available when shopping ADL's / Homemaking Assistance Needed: assisted by family   Comments: Pt states he couldnt transport wheelchair so he couldnt take it when out. Used the RW at times out in community.  Pt states the last time he showered was in January.   Pt also reports he does not have a bed.          OT Problem List: Decreased strength;Decreased activity tolerance;Impaired balance (sitting and/or standing);Decreased safety awareness;Decreased knowledge of use of DME or AE;Decreased knowledge of precautions;Cardiopulmonary status limiting activity;Obesity      OT Treatment/Interventions: Self-care/ADL training;Therapeutic exercise;DME and/or AE instruction;Therapeutic activities;Patient/family education;Balance training    OT Goals(Current goals can be found in the care plan section) Acute Rehab OT Goals Patient Stated Goal: to get stronger OT Goal Formulation: With patient Time For Goal Achievement:  03/23/20 Potential to Achieve Goals: Good  OT Frequency: Min 2X/week   Barriers to D/C:            Co-evaluation PT/OT/SLP Co-Evaluation/Treatment: Yes Reason for Co-Treatment: Complexity of the patient's impairments (multi-system involvement);For patient/therapist safety PT goals addressed during session: Mobility/safety with mobility OT goals addressed during session: ADL's and self-care      AM-PAC OT "6 Clicks" Daily Activity     Outcome Measure Help from another person eating meals?: None Help from another person taking care of personal grooming?: A Little Help from another person toileting, which includes using toliet, bedpan, or urinal?: A Lot Help from another person bathing (including washing, rinsing, drying)?: A Little Help from another person to put on and taking off regular upper body clothing?: A Little Help from another person to put on and taking off regular lower body clothing?: A Lot 6 Click Score: 17   End of Session Equipment Utilized During Treatment: Gait belt;Rolling walker Nurse Communication: Mobility status  Activity Tolerance: Patient tolerated treatment well Patient left: in chair;with call bell/phone within reach  OT Visit Diagnosis: Other abnormalities of gait and mobility (R26.89);Muscle weakness (generalized) (M62.81)                Time: [id number] OT Time Calculation (min): 24 min Charges:  OT General Charges $OT Visit: 1 Visit OT Evaluation $OT Eval Moderate Complexity: 1 Mod  Jolaine Artist, OT Acute Rehabilitation Services Pager [phone] Office [phone]    Anner Crete  Raiden Haydu 03/09/2020, 1:07 PM

## 2020-03-09 NOTE — Progress Notes (Signed)
PROGRESS NOTE    Shane Chambers  [id number] DOB: [birth year] DOA: 03/05/2020 PCP: [name], MD   Brief Narrative:  59 year old with history of DM2, peripheral neuropathy, HTN, BPH, presented to [facility] with acute diastolic CHF, elevated troponin.  Seen by cardiology and transferred to [facility] for further management.  LHC 4/28 performed showed severe multivessel CAD with elevated filling pressures, poor candidate for CABG, high risk for PCI.   Assessment & Plan:   Principal Problem:   Acute on chronic combined systolic and diastolic CHF (congestive heart failure) (HCC) Active Problems:   SOB (shortness of breath)   Hypertension   Type 2 diabetes mellitus (HCC)   Hyperlipidemia   BPH (benign prostatic hyperplasia)   Acute respiratory failure with hypoxia (HCC)   Acute hyponatremia   Ischemic cardiomyopathy   Coronary artery disease  Acute hypoxic respiratory failure Acute congestive heart failure with reduced ejection fraction, 20%, grade 3 DD Multivessel coronary artery disease -Cardiology team is following.  Supplemental oxygen as needed -Continue IV Lasix 60 mg twice daily -LHC-severe multivessel CAD with elevated filling pressures.  Has surgical anatomy but poor candidate for CABG, high risk PCI. -Cardiology to consult CT surgery -Supportive care.  Monitor electrolytes. -A1c 10.2, LDL 92.  Goal LDL should be less than 70 -CTA chest-negative for PE -Continue aspirin, statin, beta-blocker  Diabetes mellitus type 2, poorly controlled due to hyperglycemia -Insulin sliding scale Accu-Chek, A1c 10.2 -Lantus 14 units twice daily -Gabapentin 900 mg twice daily  Essential hypertension -On IV Lasix -Toprol-XL 100 mg daily  Hyperlipidemia -Statin-Lipitor 80 mg at bedtime  BPH -Flomax  Morbid obesity with BMI greater than 40 -Diet and weight loss  Intermittent hallucinations Psychiatry consulted   DVT prophylaxis: Heparin drip Code  Status: Full code Family Communication:    Status is: Inpatient  Remains inpatient appropriate because:Ongoing diagnostic testing needed not appropriate for outpatient work up.  Still ongoing evaluation for advanced CAD likely will require CABG.  Also optimizing respiratory status by giving IV diuretics   Dispo: The patient is from: Home              Anticipated d/c is to: Home              Anticipated d/c date is: 1 day              Patient currently is not medically stable to d/c. Subjective: Feels okay this morning, tells me his chest discomfort is slightly better.  Orthopnea is better as well.  Currently denying any suicidal or homicidal ideations  Review of Systems Otherwise negative except as per HPI, including: General: Denies fever, chills, night sweats or unintended weight loss. Resp: Denies cough, wheezing, shortness of breath. Cardiac: Denies chest pain, palpitations, orthopnea, paroxysmal nocturnal dyspnea. GI: Denies abdominal pain, nausea, vomiting, diarrhea or constipation GU: Denies dysuria, frequency, hesitancy or incontinence MS: Denies muscle aches, joint pain or swelling Neuro: Denies headache, neurologic deficits (focal weakness, numbness, tingling), abnormal gait Psych: Denies anxiety, depression, SI/HI/AVH Skin: Denies new rashes or lesions ID: Denies sick contacts, exotic exposures, travel  Examination: Constitutional: Not in acute distress, 2 L nasal cannula Respiratory: Bibasilar crackles Cardiovascular: Normal sinus rhythm, no rubs Abdomen: Nontender nondistended good bowel sounds Musculoskeletal: Trace bilateral lower extremity pitting edema Skin: No rashes seen Neurologic: CN 2-12 grossly intact.  And nonfocal Psychiatric: Normal judgment and insight. Alert and oriented x 3. Normal mood.  Objective: Vitals:   03/08/20 2235 03/09/20 0450 03/09/20  0820 03/09/20 0840  BP: 116/67 135/68 (!) 83/41 (!) 113/45  Pulse: 66 69 60 64  Resp: 18 20    Temp:   (!) 97.5 F (36.4 C) (!) 97.5 F (36.4 C)   TempSrc:  Oral Oral   SpO2: 96% 96% 98%   Weight:  121.7 kg    Height:        Intake/Output Summary (Last 24 hours) at 03/09/2020 1110 Last data filed at 03/09/2020 1029 Gross per 24 hour  Intake 582 ml  Output 4175 ml  Net -3593 ml   Filed Weights   03/07/20 0503 03/08/20 0316 03/09/20 0450  Weight: 134.4 kg 132 kg 121.7 kg     Data Reviewed:   CBC: Recent Labs  Lab 03/05/20 1330 03/05/20 1330 03/06/20 0412 03/07/20 0412 03/07/20 1633 03/08/20 0253 03/09/20 0337  WBC 12.6*  --  10.7* 18.0*  --  13.6* 13.0*  NEUTROABS 9.2*  --  9.5*  --   --   --   --   HGB 11.1*   < > 12.3* 11.3* 12.2*  12.6* 11.9* 11.9*  HCT 35.1*   < > 38.1* 36.0* 36.0*  37.0* 37.0* 36.9*  MCV 90.2  --  89.0 90.0  --  90.5 89.8  PLT 280  --  299 321  --  325 310   < > = values in this interval not displayed.   Basic Metabolic Panel: Recent Labs  Lab 03/05/20 1330 03/05/20 1330 03/05/20 1540 03/06/20 0412 03/07/20 0412 03/07/20 1633 03/08/20 0802 03/09/20 0337  NA 131*   < >  --  133* 134* 137  138 138 137  K 4.5   < >  --  4.0 3.4* 3.5  3.6 3.8 3.3*  CL 94*  --   --  93* 93*  --  99 96*  CO2 26  --   --  25 28  --  30 29  GLUCOSE 311*  --   --  424* 228*  --  180* 115*  BUN 22*  --   --  24* 36*  --  24* 24*  CREATININE 0.88  --   --  0.88 1.07  --  0.99 1.16  CALCIUM 9.2  --   --  9.7 9.4  --  9.5 9.4  MG  --   --  1.8 1.7 2.0  --   --  1.7  PHOS  --   --   --  5.6*  --   --   --   --    < > = values in this interval not displayed.   GFR: Estimated Creatinine Clearance: 90.5 mL/min (by C-G formula based on SCr of 1.16 mg/dL). Liver Function Tests: Recent Labs  Lab 03/05/20 1330  AST 36  ALT 21  ALKPHOS 113  BILITOT 1.1  PROT 7.4  ALBUMIN 3.7   No results for input(s): LIPASE, AMYLASE in the last 168 hours. No results for input(s): AMMONIA in the last 168 hours. Coagulation Profile: Recent Labs  Lab 03/06/20 0412  03/06/20 0648  INR 1.1 1.1   Cardiac Enzymes: No results for input(s): CKTOTAL, CKMB, CKMBINDEX, TROPONINI in the last 168 hours. BNP (last 3 results) No results for input(s): PROBNP in the last 8760 hours. HbA1C: No results for input(s): HGBA1C in the last 72 hours. CBG: Recent Labs  Lab 03/08/20 2040 03/08/20 2225 03/09/20 0010 03/09/20 0452 03/09/20 0819  GLUCAP 335* 309* 259* 131* 136*   Lipid Profile: Recent Labs  03/08/20 0802  CHOL 157  HDL 36*  LDLCALC 92  TRIG 144  CHOLHDL 4.4   Thyroid Function Tests: No results for input(s): TSH, T4TOTAL, FREET4, T3FREE, THYROIDAB in the last 72 hours. Anemia Panel: No results for input(s): VITAMINB12, FOLATE, FERRITIN, TIBC, IRON, RETICCTPCT in the last 72 hours. Sepsis Labs: No results for input(s): PROCALCITON, LATICACIDVEN in the last 168 hours.  Recent Results (from the past 240 hour(s))  Respiratory Panel by RT PCR (Flu A&B, Covid) - Nasopharyngeal Swab     Status: None   Collection Time: 03/05/20  1:29 PM   Specimen: Nasopharyngeal Swab  Result Value Ref Range Status   SARS Coronavirus 2 by RT PCR NEGATIVE NEGATIVE Final    Comment: (NOTE) SARS-CoV-2 target nucleic acids are NOT DETECTED. The SARS-CoV-2 RNA is generally detectable in upper respiratoy specimens during the acute phase of infection. The lowest concentration of SARS-CoV-2 viral copies this assay can detect is 131 copies/mL. A negative result does not preclude SARS-Cov-2 infection and should not be used as the sole basis for treatment or other patient management decisions. A negative result may occur with  improper specimen collection/handling, submission of specimen other than nasopharyngeal swab, presence of viral mutation(s) within the areas targeted by this assay, and inadequate number of viral copies (<131 copies/mL). A negative result must be combined with clinical observations, patient history, and epidemiological information. The expected  result is Negative. Fact Sheet for Patients:  PinkCheek.be Fact Sheet for Healthcare Providers:  GravelBags.it This test is not yet ap proved or cleared by the [place] FDA and  has been authorized for detection and/or diagnosis of SARS-CoV-2 by FDA under an Emergency Use Authorization (EUA). This EUA will remain  in effect (meaning this test can be used) for the duration of the COVID-19 declaration under Section 564(b)(1) of the Act, 21 U.S.C. section 360bbb-3(b)(1), unless the authorization is terminated or revoked sooner.    Influenza A by PCR NEGATIVE NEGATIVE Final   Influenza B by PCR NEGATIVE NEGATIVE Final    Comment: (NOTE) The Xpert Xpress SARS-CoV-2/FLU/RSV assay is intended as an aid in  the diagnosis of influenza from Nasopharyngeal swab specimens and  should not be used as a sole basis for treatment. Nasal washings and  aspirates are unacceptable for Xpert Xpress SARS-CoV-2/FLU/RSV  testing. Fact Sheet for Patients: PinkCheek.be Fact Sheet for Healthcare Providers: GravelBags.it This test is not yet approved or cleared by the [place] FDA and  has been authorized for detection and/or diagnosis of SARS-CoV-2 by  FDA under an Emergency Use Authorization (EUA). This EUA will remain  in effect (meaning this test can be used) for the duration of the  Covid-19 declaration under Section 564(b)(1) of the Act, 21  U.S.C. section 360bbb-3(b)(1), unless the authorization is  terminated or revoked. Performed at [facility], [address] [place], [place] [address]   MRSA PCR Screening     Status: None   Collection Time: 03/06/20 12:36 AM   Specimen: Nasal Mucosa; Nasopharyngeal  Result Value Ref Range Status   MRSA by PCR NEGATIVE NEGATIVE Final    Comment:        The GeneXpert MRSA Assay (FDA approved for NASAL specimens only), is one component of  a comprehensive MRSA colonization surveillance program. It is not intended to diagnose MRSA infection nor to guide or monitor treatment for MRSA infections. Performed at [facility], [address] [place], [place] [address]          Radiology  Studies: CARDIAC CATHETERIZATION  Result Date: 03/07/2020  Prox RCA lesion is 99% stenosed.  Prox RCA to Mid RCA lesion is 40% stenosed.  Dist RCA lesion is 30% stenosed.  Prox LAD to Mid LAD lesion is 80% stenosed.  1st Diag lesion is 60% stenosed.  Mid LM to Dist LM lesion is 40% stenosed.  Prox Cx to Mid Cx lesion is 95% stenosed.  1. 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 Moderately elevated LV/right heart filling pressures c/w known diagnosis of heart failure 3. Known severe LV dysfunction by echo Recommend: review revascularization options. Pt has surgical anatomy, but not sure he will be a candidate for CABG. High-risk multivessel PCI could be considered if he is not a candidate for surgery.        Scheduled Meds: . aspirin EC  81 mg Oral Daily  . atorvastatin  80 mg Oral QHS  . ferrous sulfate  325 mg Oral Q breakfast  . furosemide  60 mg Intravenous BID  . gabapentin  900 mg Oral BID  . insulin aspart  0-20 Units Subcutaneous Q4H  . insulin glargine  14 Units Subcutaneous BID  . metoprolol  100 mg Oral Daily  . sodium chloride flush  3 mL Intravenous Q12H  . sodium chloride flush  3 mL Intravenous Q12H  . sodium chloride flush  3 mL Intravenous Q12H  . spironolactone  25 mg Oral Daily  . tamsulosin  0.4 mg Oral Daily   Continuous Infusions: . sodium chloride    . heparin 2,000 Units/hr (03/09/20 0232)  . nitroGLYCERIN 20 mcg/min (03/09/20 0851)     LOS: 4 days   Time spent= 35 mins    Imre Vecchione Arsenio Loader, MD [facility]  If 7PM-7AM, please contact  night-coverage  03/09/2020, 11:10 AM

## 2020-03-09 NOTE — Progress Notes (Signed)
[place] for Heparin Indication: chest pain/ACS  Allergies  Allergen Reactions  . Ace Inhibitors Swelling and Cough  . Other Itching    [name]  . Penicillins     Has patient had a PCN reaction causing immediate rash, facial/tongue/throat swelling, SOB or lightheadedness with hypotension: Unknown Has patient had a PCN reaction causing severe rash involving mucus membranes or skin necrosis: Unknown Has patient had a PCN reaction that required hospitalization: Unknown Has patient had a PCN reaction occurring within the last 10 years: Unknown If all of the above answers are "NO", then may proceed with Cephalosporin use.     Patient Measurements: Height: 5\' 9"  (175.3 cm) Weight: 121.7 kg (268 lb 6.4 oz) IBW/kg (Calculated) : 70.7 Heparin Dosing Weight: 102 kg  Vital Signs: Temp: 97.5 F (36.4 C) (04/30 0820) Temp Source: Oral (04/30 0820) BP: 113/45 (04/30 0840) Pulse Rate: 64 (04/30 0840)  Labs: Recent Labs    03/06/20 1613 03/06/20 1800 03/06/20 2003 03/06/20 2204 03/07/20 0412 03/07/20 0412 03/07/20 1633 03/08/20 0253 03/08/20 0253 03/08/20 0802 03/08/20 1115 03/08/20 1813 03/09/20 0337  HGB  --   --   --   --  11.3*   < > 12.2*  12.6* 11.9*  --   --   --   --  11.9*  HCT  --   --   --   --  36.0*   < > 36.0*  37.0* 37.0*  --   --   --   --  36.9*  PLT  --   --   --   --  321  --   --  325  --   --   --   --  310  HEPARINUNFRC  --   --   --    < > 0.45   < >  --  0.13*   < >  --  0.40 0.56 0.55  CREATININE  --   --   --   --  1.07  --   --   --   --  0.99  --   --  1.16  TROPONINIHS 4,752* 4,111* 3,519*  --   --   --   --   --   --   --   --   --   --    < > = values in this interval not displayed.    Estimated Creatinine Clearance: 90.5 mL/min (by C-G formula based on SCr of 1.16 mg/dL).   Assessment: 59 y/o M admitted with ShOB and chest pain. Pharmacy consulted to initiate heparin infusion. Cardiology consult  ordered and diagnosed with NSTEMI. Transferred to [facility] for right heart cath. Found to have multivessel CAD, will be evaluated for surgery. Continuing heparin drip post cath. -heparin level at goal   Goal of Therapy:  Heparin level 0.3-0.7 units/ml Monitor platelets by anticoagulation protocol: Yes   Plan:  Continue heparin infusion at 2000 units/hr. Monitor daily HL, CBC  [name], PharmD Clinical Pharmacist **Pharmacist phone directory can now be found on [email or web address] ([email or web address]).  Listed under [place].

## 2020-03-10 DIAGNOSIS — I5043 Acute on chronic combined systolic (congestive) and diastolic (congestive) heart failure: Secondary | ICD-10-CM | POA: Diagnosis not present

## 2020-03-10 DIAGNOSIS — I214 Non-ST elevation (NSTEMI) myocardial infarction: Secondary | ICD-10-CM | POA: Diagnosis not present

## 2020-03-10 LAB — BASIC METABOLIC PANEL
Anion gap: 17 — ABNORMAL HIGH (ref 5–15)
BUN: 29 mg/dL — ABNORMAL HIGH (ref 6–20)
CO2: 23 mmol/L (ref 22–32)
Calcium: 9.7 mg/dL (ref 8.9–10.3)
Chloride: 96 mmol/L — ABNORMAL LOW (ref 98–111)
Creatinine, Ser: 1.19 mg/dL (ref 0.61–1.24)
GFR calc Af Amer: >60 mL/min (ref >60)
GFR calc non Af Amer: >60 mL/min (ref >60)
Glucose, Bld: 232 mg/dL — ABNORMAL HIGH (ref 70–99)
Potassium: 3.8 mmol/L (ref 3.5–5.1)
Sodium: 136 mmol/L (ref 135–145)

## 2020-03-10 LAB — CBC
HCT: 38.5 % — ABNORMAL LOW (ref 39.0–52.0)
Hemoglobin: 12.3 g/dL — ABNORMAL LOW (ref 13.0–17.0)
MCH: 28.3 pg (ref 26.0–34.0)
MCHC: 31.9 g/dL (ref 30.0–36.0)
MCV: 88.7 fL (ref 80.0–100.0)
Platelets: 318 10*3/uL (ref 150–400)
RBC: 4.34 MIL/uL (ref 4.22–5.81)
RDW: 13.2 % (ref 11.5–15.5)
WBC: 13 10*3/uL — ABNORMAL HIGH (ref 4.0–10.5)
nRBC: 0 % (ref 0.0–0.2)

## 2020-03-10 LAB — GLUCOSE, CAPILLARY
Glucose-Capillary: 182 mg/dL — ABNORMAL HIGH (ref 70–99)
Glucose-Capillary: 187 mg/dL — ABNORMAL HIGH (ref 70–99)
Glucose-Capillary: 275 mg/dL — ABNORMAL HIGH (ref 70–99)
Glucose-Capillary: 301 mg/dL — ABNORMAL HIGH (ref 70–99)
Glucose-Capillary: 314 mg/dL — ABNORMAL HIGH (ref 70–99)
Glucose-Capillary: 315 mg/dL — ABNORMAL HIGH (ref 70–99)
Glucose-Capillary: 319 mg/dL — ABNORMAL HIGH (ref 70–99)

## 2020-03-10 LAB — HEPARIN LEVEL (UNFRACTIONATED): Heparin Unfractionated: 0.6 IU/mL (ref 0.30–0.70)

## 2020-03-10 LAB — MAGNESIUM: Magnesium: 1.7 mg/dL (ref 1.7–2.4)

## 2020-03-10 MED ORDER — INSULIN ASPART 100 UNIT/ML ~~LOC~~ SOLN
5.0000 [IU] | Freq: Three times a day (TID) | SUBCUTANEOUS | Status: DC
  Administered 2020-03-10 – 2020-03-12 (×8): 5 [IU] via SUBCUTANEOUS

## 2020-03-10 MED ORDER — MAGNESIUM OXIDE 400 (241.3 MG) MG PO TABS
800.0000 mg | ORAL_TABLET | Freq: Once | ORAL | Status: AC
  Administered 2020-03-10: 800 mg via ORAL
  Filled 2020-03-10: qty 2

## 2020-03-10 MED ORDER — HYDRALAZINE HCL 10 MG PO TABS
10.0000 mg | ORAL_TABLET | Freq: Three times a day (TID) | ORAL | Status: DC
  Administered 2020-03-10 – 2020-03-12 (×9): 10 mg via ORAL
  Filled 2020-03-10 (×9): qty 1

## 2020-03-10 MED ORDER — POTASSIUM CHLORIDE CRYS ER 20 MEQ PO TBCR
40.0000 meq | EXTENDED_RELEASE_TABLET | Freq: Once | ORAL | Status: AC
  Administered 2020-03-10: 40 meq via ORAL
  Filled 2020-03-10: qty 2

## 2020-03-10 MED ORDER — INSULIN GLARGINE 100 UNIT/ML ~~LOC~~ SOLN
16.0000 [IU] | Freq: Two times a day (BID) | SUBCUTANEOUS | Status: DC
  Administered 2020-03-10 – 2020-03-11 (×4): 16 [IU] via SUBCUTANEOUS
  Filled 2020-03-10 (×6): qty 0.16

## 2020-03-10 NOTE — Progress Notes (Signed)
Progress Note  Patient Name: Shane Chambers Date of Encounter: 03/10/2020  Primary Cardiologist: Dr Harl Bowie  Subjective   No CP; dyspnea improving  Inpatient Medications    Scheduled Meds: . aspirin EC  81 mg Oral Daily  . atorvastatin  80 mg Oral QHS  . ferrous sulfate  325 mg Oral Q breakfast  . FLUoxetine  10 mg Oral Daily  . furosemide  40 mg Intravenous BID  . gabapentin  900 mg Oral BID  . insulin aspart  0-20 Units Subcutaneous Q4H  . insulin glargine  14 Units Subcutaneous BID  . metoprolol  100 mg Oral Daily  . sodium chloride flush  3 mL Intravenous Q12H  . sodium chloride flush  3 mL Intravenous Q12H  . sodium chloride flush  3 mL Intravenous Q12H  . spironolactone  25 mg Oral Daily  . tamsulosin  0.4 mg Oral Daily  . venlafaxine XR  112.5 mg Oral Q breakfast   Continuous Infusions: . sodium chloride    . heparin 2,000 Units/hr (03/10/20 0353)  . nitroGLYCERIN 20 mcg/min (03/09/20 0851)   PRN Meds: sodium chloride, acetaminophen, albuterol, morphine injection, nitroGLYCERIN, ondansetron (ZOFRAN) IV, polyethylene glycol, senna-docusate, sodium chloride flush   Vital Signs    Vitals:   03/09/20 1949 03/09/20 2325 03/10/20 0034 03/10/20 0441  BP: 128/72  131/61 137/72  Pulse: 85 74 73 73  Resp: 17 (!) 22 (!) 21 17  Temp: 97.8 F (36.6 C)  99.6 F (37.6 C) 97.9 F (36.6 C)  TempSrc: Oral  Oral Oral  SpO2: 96% 94% 96% 98%  Weight:    108.1 kg  Height:        Intake/Output Summary (Last 24 hours) at 03/10/2020 0734 Last data filed at 03/10/2020 0500 Gross per 24 hour  Intake -  Output 2025 ml  Net -2025 ml   Last 3 Weights 03/10/2020 03/09/2020 03/08/2020  Weight (lbs) 238 lb 6.4 oz 268 lb 6.4 oz 291 lb  Weight (kg) 108.138 kg 121.745 kg 131.997 kg      Telemetry    Sinus- Personally Reviewed   Physical Exam   GEN: No acute distress.  Obese  Neck: No JVD Cardiac: RRR, no murmurs, rubs, or gallops.  Respiratory: Clear to auscultation  bilaterally. GI: Soft, nontender, non-distended  MS: No edema Neuro:  Nonfocal  Psych: Normal affect   Labs    High Sensitivity Troponin:   Recent Labs  Lab 03/06/20 1114 03/06/20 1347 03/06/20 1613 03/06/20 1800 03/06/20 2003  TROPONINIHS 3,835* 4,590* 4,752* 4,111* 3,519*      Chemistry Recent Labs  Lab 03/05/20 1330 03/06/20 0412 03/08/20 0802 03/09/20 0337 03/10/20 0240  NA 131*   < > 138 137 136  K 4.5   < > 3.8 3.3* 3.8  CL 94*   < > 99 96* 96*  CO2 26   < > 30 29 23   GLUCOSE 311*   < > 180* 115* 232*  BUN 22*   < > 24* 24* 29*  CREATININE 0.88   < > 0.99 1.16 1.19  CALCIUM 9.2   < > 9.5 9.4 9.7  PROT 7.4  --   --   --   --   ALBUMIN 3.7  --   --   --   --   AST 36  --   --   --   --   ALT 21  --   --   --   --   [facility]  113  --   --   --   --   BILITOT 1.1  --   --   --   --   GFRNONAA >60   < > >60 >60 >60  GFRAA >60   < > >60 >60 >60  ANIONGAP 11   < > 9 12 17*   < > = values in this interval not displayed.     Hematology Recent Labs  Lab 03/08/20 0253 03/09/20 0337 03/10/20 0240  WBC 13.6* 13.0* 13.0*  RBC 4.09* 4.11* 4.34  HGB 11.9* 11.9* 12.3*  HCT 37.0* 36.9* 38.5*  MCV 90.5 89.8 88.7  MCH 29.1 29.0 28.3  MCHC 32.2 32.2 31.9  RDW 13.5 13.3 13.2  PLT 325 310 318    BNP Recent Labs  Lab 03/05/20 1330  BNP 463.0*     DDimer  Recent Labs  Lab 03/05/20 1330  DDIMER 1.08*     Patient Profile     59 y.o.malewith a history of hypertension, hyperlipidemia, type 2 diabetes mellitus, and depression who initially presented to [facility] on 03/05/2020 with shortness of breath and atypical chest pain. Ruled in for NSTEMI and found to have an EF of 20-25% on Echo.  Cardiac catheterization reveals 99% right coronary artery, 80% proximal to mid LAD and 95% proximal circumflex.  There is 40% distal left main.  Assessment & Plan    1 coronary artery disease-patient is status post non-ST elevation myocardial infarction.  Continue aspirin,  heparin, beta-blocker and statin.  Awaiting surgical evaluation for coronary artery bypass graft.  If felt not to be a candidate may need to consider high risk PCI.  2 ischemic cardiomyopathy-plan to continue Toprol at present dose.  Question angioedema with lisinopril in the past.  Continue nitroglycerin.  Add hydralazine 10 mg p.o. 3 times daily.  Titrate medications as tolerated.  3 hyperlipidemia-continue statin.  4 hypertension-patient's blood pressure is controlled.  Add hydralazine for ischemic cardiomyopathy as outlined above.  5 acute systolic congestive heart failure-volume status is improving.  Continue Lasix and spironolactone at present dose.  Follow renal function.  For questions or updates, please contact [place] Please consult [email or web address] for contact info under        Signed, Kirk Ruths, MD  03/10/2020, 7:34 AM

## 2020-03-10 NOTE — Progress Notes (Signed)
PROGRESS NOTE    Shane Chambers  [id number] DOB: 1961/09/30 DOA: 03/05/2020 PCP: [name], MD   Brief Narrative:  59 year old with history of DM2, peripheral neuropathy, HTN, BPH, presented to [facility] with acute diastolic CHF, elevated troponin.  Seen by cardiology and transferred to [facility] for further management.  LHC 4/28 performed showed severe multivessel CAD with elevated filling pressures, poor candidate for CABG, high risk for PCI.   Assessment & Plan:   Principal Problem:   Acute on chronic combined systolic and diastolic CHF (congestive heart failure) (HCC) Active Problems:   SOB (shortness of breath)   Hypertension   Type 2 diabetes mellitus (HCC)   Hyperlipidemia   BPH (benign prostatic hyperplasia)   Acute respiratory failure with hypoxia (HCC)   Acute hyponatremia   Ischemic cardiomyopathy   Coronary artery disease  Acute hypoxic respiratory failure Acute congestive heart failure with reduced ejection fraction, 20%, grade 3 DD Multivessel coronary artery disease -Cardiology team is following.  Supplemental oxygen as needed -Continue diuretics per cardiology team -LHC-severe multivessel CAD with elevated filling pressures.  Has surgical anatomy but poor candidate for CABG, high risk PCI. -Cardiology to consult CT surgery.-Briefly spoke with cardiology APP who will have someone see the patient today. -Supportive care.  Monitor electrolytes. -A1c 10.2, LDL 92.  Goal LDL should be less than 70 -CTA chest-negative for PE -Continue aspirin, statin, beta-blocker  Diabetes mellitus type 2, poorly controlled due to hyperglycemia -Insulin sliding scale Accu-Chek, A1c 10.2 -Lantus 14 units twice daily -Gabapentin 900 mg twice daily  Essential hypertension -On IV Lasix -Toprol-XL 100 mg daily  Hyperlipidemia -Statin-Lipitor 80 mg at bedtime  BPH -Flomax  Morbid obesity with BMI greater than 40 -Diet and weight  loss  Intermittent hallucinations Psychiatry consulted   DVT prophylaxis: Heparin drip Code Status: Full code Family Communication:    Status is: Inpatient  Remains inpatient appropriate because:Ongoing diagnostic testing needed not appropriate for outpatient work up.  Still ongoing evaluation for advanced CAD likely will require CABG.  Also optimizing respiratory status by giving IV diuretics   Dispo: The patient is from: Home              Anticipated d/c is to: Home              Anticipated d/c date is: 1 day              Patient currently is not medically stable to d/c. Subjective: Complaining of slight shortness of breath with exertion but denies any chest pain.  Has a small dark spot on his right toe.  Denies any trauma.  Review of Systems Otherwise negative except as per HPI, including: General: Denies fever, chills, night sweats or unintended weight loss. Resp: Denies cough, wheezing, shortness of breath. Cardiac: Denies chest pain, palpitations, orthopnea, paroxysmal nocturnal dyspnea. GI: Denies abdominal pain, nausea, vomiting, diarrhea or constipation GU: Denies dysuria, frequency, hesitancy or incontinence MS: Denies muscle aches, joint pain or swelling Neuro: Denies headache, neurologic deficits (focal weakness, numbness, tingling), abnormal gait Psych: Denies anxiety, depression, SI/HI/AVH Skin: Denies new rashes or lesions ID: Denies sick contacts, exotic exposures, travel  Examination: Constitutional: Not in acute distress 2 L nasal cannula Respiratory: Bibasilar crackles Cardiovascular: Normal sinus rhythm, no rubs Abdomen: Nontender nondistended good bowel sounds Musculoskeletal: No edema noted Skin: Small dark spot on his right lower extremity toe-unclear exactly what it is, gangrene spot? Neurologic: CN 2-12 grossly intact.  And nonfocal Psychiatric: Normal  judgment and insight. Alert and oriented x 3. Normal mood. Objective: Vitals:   03/10/20 0441  03/10/20 0827 03/10/20 0831 03/10/20 0955  BP: 137/72 120/60 120/60 136/71  Pulse: 73  75 74  Resp: 17  20   Temp: 97.9 F (36.6 C)  98 F (36.7 C)   TempSrc: Oral  Axillary   SpO2: 98%  97%   Weight: 108.1 kg     Height:        Intake/Output Summary (Last 24 hours) at 03/10/2020 0958 Last data filed at 03/10/2020 0710 Gross per 24 hour  Intake --  Output 2725 ml  Net -2725 ml   Filed Weights   03/08/20 0316 03/09/20 0450 03/10/20 0441  Weight: 132 kg 121.7 kg 108.1 kg     Data Reviewed:   CBC: Recent Labs  Lab 03/05/20 1330 03/05/20 1330 03/06/20 0412 03/06/20 0412 03/07/20 0412 03/07/20 1633 03/08/20 0253 03/09/20 0337 03/10/20 0240  WBC 12.6*   < > 10.7*  --  18.0*  --  13.6* 13.0* 13.0*  NEUTROABS 9.2*  --  9.5*  --   --   --   --   --   --   HGB 11.1*   < > 12.3*   < > 11.3* 12.2*  12.6* 11.9* 11.9* 12.3*  HCT 35.1*   < > 38.1*   < > 36.0* 36.0*  37.0* 37.0* 36.9* 38.5*  MCV 90.2   < > 89.0  --  90.0  --  90.5 89.8 88.7  PLT 280   < > 299  --  321  --  325 310 318   < > = values in this interval not displayed.   Basic Metabolic Panel: Recent Labs  Lab 03/05/20 1330 03/05/20 1540 03/06/20 0412 03/06/20 0412 03/07/20 0412 03/07/20 1633 03/08/20 0802 03/09/20 0337 03/10/20 0240  NA   < >  --  133*   < > 134* 137  138 138 137 136  K   < >  --  4.0   < > 3.4* 3.5  3.6 3.8 3.3* 3.8  CL   < >  --  93*  --  93*  --  99 96* 96*  CO2   < >  --  25  --  28  --  30 29 23   GLUCOSE   < >  --  424*  --  228*  --  180* 115* 232*  BUN   < >  --  24*  --  36*  --  24* 24* 29*  CREATININE   < >  --  0.88  --  1.07  --  0.99 1.16 1.19  CALCIUM   < >  --  9.7  --  9.4  --  9.5 9.4 9.7  MG  --  1.8 1.7  --  2.0  --   --  1.7 1.7  PHOS  --   --  5.6*  --   --   --   --   --   --    < > = values in this interval not displayed.   GFR: Estimated Creatinine Clearance: 83 mL/min (by C-G formula based on SCr of 1.19 mg/dL). Liver Function Tests: Recent Labs  Lab  03/05/20 1330  AST 36  ALT 21  ALKPHOS 113  BILITOT 1.1  PROT 7.4  ALBUMIN 3.7   No results for input(s): LIPASE, AMYLASE in the last 168 hours. No results for input(s): AMMONIA in the  last 168 hours. Coagulation Profile: Recent Labs  Lab 03/06/20 0412 03/06/20 0648  INR 1.1 1.1   Cardiac Enzymes: No results for input(s): CKTOTAL, CKMB, CKMBINDEX, TROPONINI in the last 168 hours. BNP (last 3 results) No results for input(s): PROBNP in the last 8760 hours. HbA1C: No results for input(s): HGBA1C in the last 72 hours. CBG: Recent Labs  Lab 03/09/20 1647 03/09/20 2013 03/10/20 0012 03/10/20 0439 03/10/20 0751  GLUCAP 270* 332* 301* 182* 187*   Lipid Profile: Recent Labs    03/08/20 0802  CHOL 157  HDL 36*  LDLCALC 92  TRIG 144  CHOLHDL 4.4   Thyroid Function Tests: No results for input(s): TSH, T4TOTAL, FREET4, T3FREE, THYROIDAB in the last 72 hours. Anemia Panel: No results for input(s): VITAMINB12, FOLATE, FERRITIN, TIBC, IRON, RETICCTPCT in the last 72 hours. Sepsis Labs: No results for input(s): PROCALCITON, LATICACIDVEN in the last 168 hours.  Recent Results (from the past 240 hour(s))  Respiratory Panel by RT PCR (Flu A&B, Covid) - Nasopharyngeal Swab     Status: None   Collection Time: 03/05/20  1:29 PM   Specimen: Nasopharyngeal Swab  Result Value Ref Range Status   SARS Coronavirus 2 by RT PCR NEGATIVE NEGATIVE Final    Comment: (NOTE) SARS-CoV-2 target nucleic acids are NOT DETECTED. The SARS-CoV-2 RNA is generally detectable in upper respiratoy specimens during the acute phase of infection. The lowest concentration of SARS-CoV-2 viral copies this assay can detect is 131 copies/mL. A negative result does not preclude SARS-Cov-2 infection and should not be used as the sole basis for treatment or other patient management decisions. A negative result may occur with  improper specimen collection/handling, submission of specimen other than  nasopharyngeal swab, presence of viral mutation(s) within the areas targeted by this assay, and inadequate number of viral copies (<131 copies/mL). A negative result must be combined with clinical observations, patient history, and epidemiological information. The expected result is Negative. Fact Sheet for Patients:  PinkCheek.be Fact Sheet for Healthcare Providers:  GravelBags.it This test is not yet ap proved or cleared by the [place] FDA and  has been authorized for detection and/or diagnosis of SARS-CoV-2 by FDA under an Emergency Use Authorization (EUA). This EUA will remain  in effect (meaning this test can be used) for the duration of the COVID-19 declaration under Section 564(b)(1) of the Act, 21 U.S.C. section 360bbb-3(b)(1), unless the authorization is terminated or revoked sooner.    Influenza A by PCR NEGATIVE NEGATIVE Final   Influenza B by PCR NEGATIVE NEGATIVE Final    Comment: (NOTE) The Xpert Xpress SARS-CoV-2/FLU/RSV assay is intended as an aid in  the diagnosis of influenza from Nasopharyngeal swab specimens and  should not be used as a sole basis for treatment. Nasal washings and  aspirates are unacceptable for Xpert Xpress SARS-CoV-2/FLU/RSV  testing. Fact Sheet for Patients: PinkCheek.be Fact Sheet for Healthcare Providers: GravelBags.it This test is not yet approved or cleared by the [place] FDA and  has been authorized for detection and/or diagnosis of SARS-CoV-2 by  FDA under an Emergency Use Authorization (EUA). This EUA will remain  in effect (meaning this test can be used) for the duration of the  Covid-19 declaration under Section 564(b)(1) of the Act, 21  U.S.C. section 360bbb-3(b)(1), unless the authorization is  terminated or revoked. Performed at [facility], [address] [place], [place] [address]   MRSA PCR Screening      Status: None   Collection Time: 03/06/20 12:36  AM   Specimen: Nasal Mucosa; Nasopharyngeal  Result Value Ref Range Status   MRSA by PCR NEGATIVE NEGATIVE Final    Comment:        The GeneXpert MRSA Assay (FDA approved for NASAL specimens only), is one component of a comprehensive MRSA colonization surveillance program. It is not intended to diagnose MRSA infection nor to guide or monitor treatment for MRSA infections. Performed at [facility], [address] [place], [place] [address]          Radiology Studies: No results found.      Scheduled Meds: . aspirin EC  81 mg Oral Daily  . atorvastatin  80 mg Oral QHS  . ferrous sulfate  325 mg Oral Q breakfast  . FLUoxetine  10 mg Oral Daily  . furosemide  40 mg Intravenous BID  . gabapentin  900 mg Oral BID  . hydrALAZINE  10 mg Oral Q8H  . insulin aspart  0-20 Units Subcutaneous Q4H  . insulin aspart  5 Units Subcutaneous TID WC  . insulin glargine  16 Units Subcutaneous BID  . metoprolol  100 mg Oral Daily  . sodium chloride flush  3 mL Intravenous Q12H  . sodium chloride flush  3 mL Intravenous Q12H  . sodium chloride flush  3 mL Intravenous Q12H  . spironolactone  25 mg Oral Daily  . tamsulosin  0.4 mg Oral Daily  . venlafaxine XR  112.5 mg Oral Q breakfast   Continuous Infusions: . sodium chloride    . heparin 2,000 Units/hr (03/10/20 0353)  . nitroGLYCERIN 15 mcg/min (03/10/20 0840)     LOS: 5 days   Time spent= 35 mins    Alric Geise Arsenio Loader, MD [facility]  If 7PM-7AM, please contact night-coverage  03/10/2020, 9:58 AM

## 2020-03-10 NOTE — Progress Notes (Signed)
[place] for Heparin Indication: chest pain/ACS  Allergies  Allergen Reactions  . Ace Inhibitors Swelling and Cough  . Other Itching    [name]  . Penicillins     Has patient had a PCN reaction causing immediate rash, facial/tongue/throat swelling, SOB or lightheadedness with hypotension: Unknown Has patient had a PCN reaction causing severe rash involving mucus membranes or skin necrosis: Unknown Has patient had a PCN reaction that required hospitalization: Unknown Has patient had a PCN reaction occurring within the last 10 years: Unknown If all of the above answers are "NO", then may proceed with Cephalosporin use.     Patient Measurements: Height: 5\' 9"  (175.3 cm) Weight: 108.1 kg (238 lb 6.4 oz) IBW/kg (Calculated) : 70.7 Heparin Dosing Weight: 102 kg  Vital Signs: Temp: 98 F (36.7 C) (05/01 0831) Temp Source: Axillary (05/01 0831) BP: 120/60 (05/01 0831) Pulse Rate: 75 (05/01 0831)  Labs: Recent Labs    03/08/20 0253 03/08/20 0253 03/08/20 0802 03/08/20 1115 03/08/20 1813 03/09/20 0337 03/10/20 0240  HGB 11.9*   < >  --   --   --  11.9* 12.3*  HCT 37.0*  --   --   --   --  36.9* 38.5*  PLT 325  --   --   --   --  310 318  HEPARINUNFRC 0.13*  --   --    < > 0.56 0.55 0.60  CREATININE  --   --  0.99  --   --  1.16 1.19   < > = values in this interval not displayed.    Estimated Creatinine Clearance: 83 mL/min (by C-G formula based on SCr of 1.19 mg/dL).   Assessment: 59 y/o M admitted with ShOB and chest pain. Pharmacy consulted to initiate heparin infusion. Cardiology consult ordered and diagnosed with NSTEMI. Transferred to [facility] for right heart cath. Found to have multivessel CAD, will be evaluated for surgery. Continuing heparin drip post cath.  Heparin level this morning came back therapeutic at 0.6, on 2000 units/hr. Hgb 12.3, plt 318. No s/sx of bleeding or infusion issues.   Goal of Therapy:  Heparin level  0.3-0.7 units/ml Monitor platelets by anticoagulation protocol: Yes   Plan:  Continue heparin infusion at 2000 units/hr. Monitor daily HL, CBC  Antonietta Jewel, PharmD, [place] Clinical Pharmacist  Phone: [phone] 03/10/2020 8:58 AM  Please check AMION for all [place] phone numbers After 10:00 PM, call [place] [phone]

## 2020-03-11 DIAGNOSIS — I5043 Acute on chronic combined systolic (congestive) and diastolic (congestive) heart failure: Secondary | ICD-10-CM | POA: Diagnosis not present

## 2020-03-11 LAB — BASIC METABOLIC PANEL
Anion gap: 12 (ref 5–15)
BUN: 29 mg/dL — ABNORMAL HIGH (ref 6–20)
CO2: 24 mmol/L (ref 22–32)
Calcium: 9.6 mg/dL (ref 8.9–10.3)
Chloride: 98 mmol/L (ref 98–111)
Creatinine, Ser: 1.07 mg/dL (ref 0.61–1.24)
GFR calc Af Amer: >60 mL/min (ref >60)
GFR calc non Af Amer: >60 mL/min (ref >60)
Glucose, Bld: 193 mg/dL — ABNORMAL HIGH (ref 70–99)
Potassium: 4.3 mmol/L (ref 3.5–5.1)
Sodium: 134 mmol/L — ABNORMAL LOW (ref 135–145)

## 2020-03-11 LAB — CBC
HCT: 40.4 % (ref 39.0–52.0)
Hemoglobin: 13 g/dL (ref 13.0–17.0)
MCH: 28.8 pg (ref 26.0–34.0)
MCHC: 32.2 g/dL (ref 30.0–36.0)
MCV: 89.4 fL (ref 80.0–100.0)
Platelets: 375 10*3/uL (ref 150–400)
RBC: 4.52 MIL/uL (ref 4.22–5.81)
RDW: 13.4 % (ref 11.5–15.5)
WBC: 15.8 10*3/uL — ABNORMAL HIGH (ref 4.0–10.5)
nRBC: 0 % (ref 0.0–0.2)

## 2020-03-11 LAB — HEPARIN LEVEL (UNFRACTIONATED)
Heparin Unfractionated: 1.08 IU/mL — ABNORMAL HIGH (ref 0.30–0.70)
Heparin Unfractionated: 0.9 IU/mL — ABNORMAL HIGH (ref 0.30–0.70)
Heparin Unfractionated: 0.51 IU/mL (ref 0.30–0.70)

## 2020-03-11 LAB — GLUCOSE, CAPILLARY
Glucose-Capillary: 172 mg/dL — ABNORMAL HIGH (ref 70–99)
Glucose-Capillary: 186 mg/dL — ABNORMAL HIGH (ref 70–99)
Glucose-Capillary: 199 mg/dL — ABNORMAL HIGH (ref 70–99)
Glucose-Capillary: 218 mg/dL — ABNORMAL HIGH (ref 70–99)
Glucose-Capillary: 254 mg/dL — ABNORMAL HIGH (ref 70–99)
Glucose-Capillary: 285 mg/dL — ABNORMAL HIGH (ref 70–99)

## 2020-03-11 LAB — MAGNESIUM: Magnesium: 1.8 mg/dL (ref 1.7–2.4)

## 2020-03-11 MED ORDER — FUROSEMIDE 40 MG PO TABS
40.0000 mg | ORAL_TABLET | Freq: Every day | ORAL | Status: DC
  Administered 2020-03-11 – 2020-03-12 (×2): 40 mg via ORAL
  Filled 2020-03-11 (×2): qty 1

## 2020-03-11 MED ORDER — SPIRONOLACTONE 12.5 MG HALF TABLET
12.5000 mg | ORAL_TABLET | Freq: Every day | ORAL | Status: DC
  Administered 2020-03-11 – 2020-03-12 (×2): 12.5 mg via ORAL
  Filled 2020-03-11 (×3): qty 1

## 2020-03-11 NOTE — Progress Notes (Signed)
Progress Note  Patient Name: Shane Chambers Date of Encounter: 03/11/2020  Primary Cardiologist: Dr Harl Bowie  Subjective   No CP or dyspnea  Inpatient Medications    Scheduled Meds: . aspirin EC  81 mg Oral Daily  . atorvastatin  80 mg Oral QHS  . ferrous sulfate  325 mg Oral Q breakfast  . FLUoxetine  10 mg Oral Daily  . furosemide  40 mg Intravenous BID  . gabapentin  900 mg Oral BID  . hydrALAZINE  10 mg Oral Q8H  . insulin aspart  0-20 Units Subcutaneous Q4H  . insulin aspart  5 Units Subcutaneous TID WC  . insulin glargine  16 Units Subcutaneous BID  . metoprolol  100 mg Oral Daily  . sodium chloride flush  3 mL Intravenous Q12H  . sodium chloride flush  3 mL Intravenous Q12H  . sodium chloride flush  3 mL Intravenous Q12H  . spironolactone  25 mg Oral Daily  . tamsulosin  0.4 mg Oral Daily  . venlafaxine XR  112.5 mg Oral Q breakfast   Continuous Infusions: . sodium chloride    . heparin 1,700 Units/hr (03/11/20 0538)  . nitroGLYCERIN 20 mcg/min (03/11/20 0441)   PRN Meds: sodium chloride, acetaminophen, albuterol, morphine injection, nitroGLYCERIN, ondansetron (ZOFRAN) IV, polyethylene glycol, senna-docusate, sodium chloride flush   Vital Signs    Vitals:   03/10/20 1621 03/10/20 2014 03/11/20 0018 03/11/20 0532  BP: 122/67 128/73 103/66 129/77  Pulse: 73 78 74 72  Resp: 20 19 20  (P) 19  Temp: 98 F (36.7 C) 98.1 F (36.7 C) 98.3 F (36.8 C) (P) 98.1 F (36.7 C)  TempSrc: Oral Oral Oral (P) Oral  SpO2: 99% 99% 99% 96%  Weight:    (P) 129.7 kg  Height:        Intake/Output Summary (Last 24 hours) at 03/11/2020 0648 Last data filed at 03/11/2020 0200 Gross per 24 hour  Intake 820 ml  Output 3530 ml  Net -2710 ml   Last 3 Weights 03/11/2020 03/10/2020 03/09/2020  Weight (lbs) 286 lb 238 lb 6.4 oz 268 lb 6.4 oz  Weight (kg) 129.729 kg 108.138 kg 121.745 kg      Telemetry    Sinus  - Personally Reviewed   Physical Exam   GEN: No acute distress.     Neck: No JVD Cardiac: RRR, no murmurs, rubs, or gallops.  Respiratory: Clear to auscultation bilaterally. GI: Soft, nontender, non-distended  MS: No edema Neuro:  Nonfocal  Psych: Normal affect   Labs    High Sensitivity Troponin:   Recent Labs  Lab 03/06/20 1114 03/06/20 1347 03/06/20 1613 03/06/20 1800 03/06/20 2003  TROPONINIHS 3,835* 4,590* 4,752* 4,111* 3,519*      Chemistry Recent Labs  Lab 03/05/20 1330 03/06/20 0412 03/09/20 0337 03/10/20 0240 03/11/20 0457  NA 131*   < > 137 136 134*  K 4.5   < > 3.3* 3.8 4.3  CL 94*   < > 96* 96* 98  CO2 26   < > 29 23 24   GLUCOSE 311*   < > 115* 232* 193*  BUN 22*   < > 24* 29* 29*  CREATININE 0.88   < > 1.16 1.19 1.07  CALCIUM 9.2   < > 9.4 9.7 9.6  PROT 7.4  --   --   --   --   ALBUMIN 3.7  --   --   --   --   AST 36  --   --   --   --  ALT 21  --   --   --   --   ALKPHOS 113  --   --   --   --   BILITOT 1.1  --   --   --   --   GFRNONAA >60   < > >60 >60 >60  GFRAA >60   < > >60 >60 >60  ANIONGAP 11   < > 12 17* 12   < > = values in this interval not displayed.     Hematology Recent Labs  Lab 03/09/20 0337 03/10/20 0240 03/11/20 0457  WBC 13.0* 13.0* 15.8*  RBC 4.11* 4.34 4.52  HGB 11.9* 12.3* 13.0  HCT 36.9* 38.5* 40.4  MCV 89.8 88.7 89.4  MCH 29.0 28.3 28.8  MCHC 32.2 31.9 32.2  RDW 13.3 13.2 13.4  PLT 310 318 375    BNP Recent Labs  Lab 03/05/20 1330  BNP 463.0*     DDimer  Recent Labs  Lab 03/05/20 1330  DDIMER 1.08*     Patient Profile     59 y.o.malewith a history of hypertension, hyperlipidemia, type 2 diabetes mellitus, and depression who initially presented to [facility] on 03/05[facility]2020 with shortness of breath and atypical chest pain. Ruled in for NSTEMI and found to have an EF of 20-25% on Echo.  Cardiac catheterization reveals 99% right coronary artery, 80% proximal to mid LAD and 95% proximal circumflex.  There is 40% distal left main.  Assessment & Plan    1 coronary  artery disease-patient is status post non-ST elevation myocardial infarction.    Had short episode of chest pain yesterday relieved with increased nitroglycerin.  We will continue.  Continue aspirin, heparin, beta-blocker and statin. Awaiting surgical evaluation for coronary artery bypass graft. If felt not to be a candidate may need to consider high risk PCI.  2 ischemic cardiomyopathy-plan to continue Toprol at present dose.  Question angioedema with lisinopril in the past.  Continue nitroglycerin.  Continue low-dose hydralazine.  Titrate medications as tolerated.  3 hyperlipidemia-continue statin.  4 hypertension-patient's blood pressure is controlled.    5 acute systolic congestive heart failure-volume status is improving.    Change Lasix to 40 mg by mouth daily and spironolactone to 12.5 mg daily. Follow renal function.  For questions or updates, please contact [place] Please consult [email or web address] for contact info under        Signed, Kirk Ruths, MD  03/11/2020, 6:48 AM

## 2020-03-11 NOTE — Progress Notes (Addendum)
PROGRESS NOTE    Shane Chambers  [id number] DOB: 03/18/1961 DOA: 03/05/2020 PCP: [name], MD   Brief Narrative:  59 year old with history of DM2, peripheral neuropathy, HTN, BPH, presented to [facility] with acute diastolic CHF, elevated troponin.  Seen by cardiology and transferred to [facility] for further management.  LHC 4/28 performed showed severe multivessel CAD with elevated filling pressures, poor candidate for CABG, high risk for PCI.   Assessment & Plan:   Principal Problem:   Acute on chronic combined systolic and diastolic CHF (congestive heart failure) (HCC) Active Problems:   SOB (shortness of breath)   Hypertension   Type 2 diabetes mellitus (HCC)   Hyperlipidemia   BPH (benign prostatic hyperplasia)   Acute respiratory failure with hypoxia (HCC)   Acute hyponatremia   Ischemic cardiomyopathy   Coronary artery disease  Acute hypoxic respiratory failure Acute congestive heart failure with reduced ejection fraction, 20%, grade 3 DD Multivessel coronary artery disease -Continue diuretics per cardiology team -LHC-severe multivessel CAD with elevated filling pressures.  Has surgical anatomy but poor candidate for CABG, high risk PCI. -Seen by cardiology, still awaiting CT surgery input -Continue nitroglycerin drip for chest discomfort/angina -A1c 10.2, LDL 92.   -CTA chest-negative for PE -Continue aspirin, statin, beta-blocker  Diabetes mellitus type 2, poorly controlled due to hyperglycemia -Insulin sliding scale Accu-Chek, A1c 10.2 -Lantus 14 units twice daily -Gabapentin 900 mg twice daily  Essential hypertension -On IV Lasix -Toprol-XL 100 mg daily  Hyperlipidemia -Statin-Lipitor 80 mg at bedtime  BPH -Flomax  Morbid obesity with BMI greater than 40 -Diet and weight loss  Intermittent hallucinations Psychiatry consulted   DVT prophylaxis: Heparin drip Code Status: Full code Family Communication:    Status is:  Inpatient  Remains inpatient appropriate because:Ongoing diagnostic testing needed not appropriate for outpatient work up.  Still ongoing evaluation for advanced CAD likely will require CABG.  Also optimizing respiratory status by giving IV diuretics   Dispo: The patient is from: Home              Anticipated d/c is to: Home              Anticipated d/c date is: 1 day              Patient currently is not medically stable to d/c. Subjective: After turning on nitroglycerin drip yesterday he had some chest discomfort on the left side and left shoulder discomfort.  Improved after restarting nitroglycerin.  Review of Systems Otherwise negative except as per HPI, including: General: Denies fever, chills, night sweats or unintended weight loss. Resp: Denies cough, wheezing, shortness of breath. Cardiac: Denies  palpitations, orthopnea, paroxysmal nocturnal dyspnea. GI: Denies abdominal pain, nausea, vomiting, diarrhea or constipation GU: Denies dysuria, frequency, hesitancy or incontinence MS: Denies muscle aches, joint pain or swelling Neuro: Denies headache, neurologic deficits (focal weakness, numbness, tingling), abnormal gait Psych: Denies anxiety, depression, SI/HI/AVH Skin: Denies new rashes or lesions ID: Denies sick contacts, exotic exposures, travel  Examination: Constitutional: Not in acute distress Respiratory: Minimal bibasilar crackles Cardiovascular: Normal sinus rhythm, no rubs Abdomen: Nontender nondistended good bowel sounds Musculoskeletal: No edema noted Skin: Small dark spot on the right toe of lower extremity.  Pressure sore Neurologic: CN 2-12 grossly intact.  And nonfocal Psychiatric: Normal judgment and insight. Alert and oriented x 3. Normal mood. Objective: Vitals:   03/11/20 0018 03/11/20 0532 03/11/20 0752 03/11/20 0835  BP: 103/66 129/77 129/70 131/72  Pulse: 74 72  70 76  Resp: 20 19    Temp: 98.3 F (36.8 C) 98.1 F (36.7 C) 97.8 F (36.6 C)     TempSrc: Oral Oral Oral   SpO2: 99% 96% 100%   Weight:  129.7 kg    Height:        Intake/Output Summary (Last 24 hours) at 03/11/2020 1022 Last data filed at 03/11/2020 0200 Gross per 24 hour  Intake 580 ml  Output 2200 ml  Net -1620 ml   Filed Weights   03/09/20 0450 03/10/20 0441 03/11/20 0532  Weight: 121.7 kg 108.1 kg 129.7 kg     Data Reviewed:   CBC: Recent Labs  Lab 03/05/20 1330 03/05/20 1330 03/06/20 0412 03/06/20 0412 03/07/20 0412 03/07/20 0412 03/07/20 1633 03/08/20 0253 03/09/20 0337 03/10/20 0240 03/11/20 0457  WBC 12.6*   < > 10.7*   < > 18.0*  --   --  13.6* 13.0* 13.0* 15.8*  NEUTROABS 9.2*  --  9.5*  --   --   --   --   --   --   --   --   HGB 11.1*   < > 12.3*   < > 11.3*   < > 12.2*  12.6* 11.9* 11.9* 12.3* 13.0  HCT 35.1*   < > 38.1*   < > 36.0*   < > 36.0*  37.0* 37.0* 36.9* 38.5* 40.4  MCV 90.2   < > 89.0   < > 90.0  --   --  90.5 89.8 88.7 89.4  PLT 280   < > 299   < > 321  --   --  325 310 318 375   < > = values in this interval not displayed.   Basic Metabolic Panel: Recent Labs  Lab 03/06/20 0412 03/06/20 0412 03/07/20 0412 03/07/20 0412 03/07/20 1633 03/08/20 0802 03/09/20 0337 03/10/20 0240 03/11/20 0457  NA 133*   < > 134*   < > 137  138 138 137 136 134*  K 4.0   < > 3.4*   < > 3.5  3.6 3.8 3.3* 3.8 4.3  CL 93*   < > 93*  --   --  99 96* 96* 98  CO2 25   < > 28  --   --  30 29 23 24   GLUCOSE 424*   < > 228*  --   --  180* 115* 232* 193*  BUN 24*   < > 36*  --   --  24* 24* 29* 29*  CREATININE 0.88   < > 1.07  --   --  0.99 1.16 1.19 1.07  CALCIUM 9.7   < > 9.4  --   --  9.5 9.4 9.7 9.6  MG 1.7  --  2.0  --   --   --  1.7 1.7 1.8  PHOS 5.6*  --   --   --   --   --   --   --   --    < > = values in this interval not displayed.   GFR: Estimated Creatinine Clearance: 101.6 mL/min (by C-G formula based on SCr of 1.07 mg/dL). Liver Function Tests: Recent Labs  Lab 03/05/20 1330  AST 36  ALT 21  ALKPHOS 113  BILITOT  1.1  PROT 7.4  ALBUMIN 3.7   No results for input(s): LIPASE, AMYLASE in the last 168 hours. No results for input(s): AMMONIA in the last 168 hours. Coagulation Profile: Recent Labs  Lab 03/06/20 0412 03/06/20 0648  INR 1.1 1.1   Cardiac Enzymes: No results for input(s): CKTOTAL, CKMB, CKMBINDEX, TROPONINI in the last 168 hours. BNP (last 3 results) No results for input(s): PROBNP in the last 8760 hours. HbA1C: No results for input(s): HGBA1C in the last 72 hours. CBG: Recent Labs  Lab 03/10/20 1812 03/10/20 2016 03/11/20 0024 03/11/20 0532 03/11/20 0751  GLUCAP 315* 319* 254* 186* 172*   Lipid Profile: No results for input(s): CHOL, HDL, LDLCALC, TRIG, CHOLHDL, LDLDIRECT in the last 72 hours. Thyroid Function Tests: No results for input(s): TSH, T4TOTAL, FREET4, T3FREE, THYROIDAB in the last 72 hours. Anemia Panel: No results for input(s): VITAMINB12, FOLATE, FERRITIN, TIBC, IRON, RETICCTPCT in the last 72 hours. Sepsis Labs: No results for input(s): PROCALCITON, LATICACIDVEN in the last 168 hours.  Recent Results (from the past 240 hour(s))  Respiratory Panel by RT PCR (Flu A&B, Covid) - Nasopharyngeal Swab     Status: None   Collection Time: 03/05/20  1:29 PM   Specimen: Nasopharyngeal Swab  Result Value Ref Range Status   SARS Coronavirus 2 by RT PCR NEGATIVE NEGATIVE Final    Comment: (NOTE) SARS-CoV-2 target nucleic acids are NOT DETECTED. The SARS-CoV-2 RNA is generally detectable in upper respiratoy specimens during the acute phase of infection. The lowest concentration of SARS-CoV-2 viral copies this assay can detect is 131 copies/mL. A negative result does not preclude SARS-Cov-2 infection and should not be used as the sole basis for treatment or other patient management decisions. A negative result may occur with  improper specimen collection/handling, submission of specimen other than nasopharyngeal swab, presence of viral mutation(s) within the areas  targeted by this assay, and inadequate number of viral copies (<131 copies/mL). A negative result must be combined with clinical observations, patient history, and epidemiological information. The expected result is Negative. Fact Sheet for Patients:  PinkCheek.be Fact Sheet for Healthcare Providers:  GravelBags.it This test is not yet ap proved or cleared by the [place] FDA and  has been authorized for detection and/or diagnosis of SARS-CoV-2 by FDA under an Emergency Use Authorization (EUA). This EUA will remain  in effect (meaning this test can be used) for the duration of the COVID-19 declaration under Section 564(b)(1) of the Act, 21 U.S.C. section 360bbb-3(b)(1), unless the authorization is terminated or revoked sooner.    Influenza A by PCR NEGATIVE NEGATIVE Final   Influenza B by PCR NEGATIVE NEGATIVE Final    Comment: (NOTE) The Xpert Xpress SARS-CoV-2/FLU/RSV assay is intended as an aid in  the diagnosis of influenza from Nasopharyngeal swab specimens and  should not be used as a sole basis for treatment. Nasal washings and  aspirates are unacceptable for Xpert Xpress SARS-CoV-2/FLU/RSV  testing. Fact Sheet for Patients: PinkCheek.be Fact Sheet for Healthcare Providers: GravelBags.it This test is not yet approved or cleared by the [place] FDA and  has been authorized for detection and/or diagnosis of SARS-CoV-2 by  FDA under an Emergency Use Authorization (EUA). This EUA will remain  in effect (meaning this test can be used) for the duration of the  Covid-19 declaration under Section 564(b)(1) of the Act, 21  U.S.C. section 360bbb-3(b)(1), unless the authorization is  terminated or revoked. Performed at [facility], [address], [place], [place] [address]   MRSA PCR Screening     Status: None   Collection Time: 03/06/20 12:36 AM   Specimen:  Nasal Mucosa; Nasopharyngeal  Result Value Ref Range Status   MRSA by  PCR NEGATIVE NEGATIVE Final    Comment:        The GeneXpert MRSA Assay (FDA approved for NASAL specimens only), is one component of a comprehensive MRSA colonization surveillance program. It is not intended to diagnose MRSA infection nor to guide or monitor treatment for MRSA infections. Performed at [facility], [address] [place], [place] [address]          Radiology Studies: No results found.      Scheduled Meds: . aspirin EC  81 mg Oral Daily  . atorvastatin  80 mg Oral QHS  . ferrous sulfate  325 mg Oral Q breakfast  . FLUoxetine  10 mg Oral Daily  . furosemide  40 mg Oral Daily  . gabapentin  900 mg Oral BID  . hydrALAZINE  10 mg Oral Q8H  . insulin aspart  0-20 Units Subcutaneous Q4H  . insulin aspart  5 Units Subcutaneous TID WC  . insulin glargine  16 Units Subcutaneous BID  . metoprolol  100 mg Oral Daily  . sodium chloride flush  3 mL Intravenous Q12H  . sodium chloride flush  3 mL Intravenous Q12H  . sodium chloride flush  3 mL Intravenous Q12H  . spironolactone  12.5 mg Oral Daily  . tamsulosin  0.4 mg Oral Daily  . venlafaxine XR  112.5 mg Oral Q breakfast   Continuous Infusions: . sodium chloride    . heparin 1,700 Units/hr (03/11/20 0538)  . nitroGLYCERIN 20 mcg/min (03/11/20 0441)     LOS: 6 days   Time spent= 35 mins    Cristal Qadir Arsenio Loader, MD [facility]  If 7PM-7AM, please contact night-coverage  03/11/2020, 10:22 AM

## 2020-03-11 NOTE — Progress Notes (Signed)
[place] for Heparin Indication: chest pain/ACS  Allergies  Allergen Reactions  . Ace Inhibitors Swelling and Cough  . Other Itching    [name]  . Penicillins     Has patient had a PCN reaction causing immediate rash, facial/tongue/throat swelling, SOB or lightheadedness with hypotension: Unknown Has patient had a PCN reaction causing severe rash involving mucus membranes or skin necrosis: Unknown Has patient had a PCN reaction that required hospitalization: Unknown Has patient had a PCN reaction occurring within the last 10 years: Unknown If all of the above answers are "NO", then may proceed with Cephalosporin use.     Patient Measurements: Height: 5\' 9"  (175.3 cm) Weight: 129.7 kg (286 lb) IBW/kg (Calculated) : 70.7 Heparin Dosing Weight: 102 kg  Vital Signs: Temp: 97.5 F (36.4 C) (05/02 1200) Temp Source: Oral (05/02 1200) BP: 107/52 (05/02 1200) Pulse Rate: 76 (05/02 0835)  Labs: Recent Labs    03/09/20 0337 03/09/20 0337 03/10/20 0240 03/11/20 0457 03/11/20 1138  HGB 11.9*   < > 12.3* 13.0  --   HCT 36.9*  --  38.5* 40.4  --   PLT 310  --  318 375  --   HEPARINUNFRC 0.55   < > 0.60 1.08* 0.90*  CREATININE 1.16  --  1.19 1.07  --    < > = values in this interval not displayed.    Estimated Creatinine Clearance: 101.6 mL/min (by C-G formula based on SCr of 1.07 mg/dL).   Assessment: 59 y/o M admitted with ShOB and chest pain. Pharmacy consulted to initiate heparin infusion. Cardiology consult ordered and diagnosed with NSTEMI. Transferred to [facility] for right heart cath. Found to have multivessel CAD, will be evaluated for surgery. Continuing heparin drip post cath.  Heparin level this morning came back supratherapeutic at 0.9, on 1700 units/hr. Hgb 13, plt 375. No s/sx of bleeding or infusion issues. Level drawn appropriately.   Goal of Therapy:  Heparin level 0.3-0.7 units/ml Monitor platelets by anticoagulation  protocol: Yes   Plan:  Reduce heparin infusion to 1500 units/hr. Monitor daily HL, CBC  Antonietta Jewel, PharmD, BCCCP Clinical Pharmacist  Phone: [phone] 03/11/2020 12:28 PM  Please check AMION for all [place] phone numbers After 10:00 PM, call [place] [phone]

## 2020-03-11 NOTE — Progress Notes (Signed)
Pt's HR dropped to upper 40s while he was in bed.  Pt was asymptomatic. He is currently in SR in 17s.  Idolina Primer, RN

## 2020-03-11 NOTE — Progress Notes (Signed)
ANTICOAGULATION CONSULT NOTE - Follow Up Consult  Pharmacy Consult for heparin Indication: CAD awaiting surgical eval  Labs: Recent Labs    03/08/20 0802 03/08/20 1115 03/09/20 0337 03/09/20 0337 03/10/20 0240 03/11/20 0457  HGB  --   --  11.9*   < > 12.3* 13.0  HCT  --   --  36.9*  --  38.5* 40.4  PLT  --   --  310  --  318 375  HEPARINUNFRC  --    < > 0.55  --  0.60 1.08*  CREATININE 0.99  --  1.16  --  1.19  --    < > = values in this interval not displayed.    Assessment: 59yo male supratherapeutic on heparin after several levels at goal though had begun to trend up; no gtt issues or signs of bleeding per RN and confirmed that labs were drawn from arm opposite heparin infusion.  Goal of Therapy:  Heparin level 0.3-0.7 units/ml   Plan:  Will decrease heparin gtt by 3 units/kg/hr to 1700 units/hr and check level in 6 hours.    Wynona Neat, PharmD, BCPS  03/11/2020,5:38 AM

## 2020-03-11 NOTE — Progress Notes (Signed)
[place] for Heparin Indication: chest pain/ACS  Allergies  Allergen Reactions  . Ace Inhibitors Swelling and Cough  . Other Itching    [name]  . Penicillins     Has patient had a PCN reaction causing immediate rash, facial/tongue/throat swelling, SOB or lightheadedness with hypotension: Unknown Has patient had a PCN reaction causing severe rash involving mucus membranes or skin necrosis: Unknown Has patient had a PCN reaction that required hospitalization: Unknown Has patient had a PCN reaction occurring within the last 10 years: Unknown If all of the above answers are "NO", then may proceed with Cephalosporin use.     Patient Measurements: Height: 5\' 9"  (175.3 cm) Weight: 129.7 kg (286 lb) IBW/kg (Calculated) : 70.7 Heparin Dosing Weight: 102 kg  Vital Signs: Temp: 97.7 F (36.5 C) (05/02 1632) Temp Source: Oral (05/02 1632) BP: 119/59 (05/02 1632) Pulse Rate: 66 (05/02 1632)  Labs: Recent Labs    03/09/20 [id number] 03/09/20 0337 03/10/20 0240 03/10/20 0240 03/11/20 0457 03/11/20 1138 03/11/20 1830  HGB 11.9*   < > 12.3*  --  13.0  --   --   HCT 36.9*  --  38.5*  --  40.4  --   --   PLT 310  --  318  --  375  --   --   HEPARINUNFRC 0.55   < > 0.60   < > 1.08* 0.90* 0.51  CREATININE 1.16  --  1.19  --  1.07  --   --    < > = values in this interval not displayed.    Estimated Creatinine Clearance: 101.6 mL/min (by C-G formula based on SCr of 1.07 mg/dL).   Assessment: 59 y/o M admitted with ShOB and chest pain. Pharmacy consulted to initiate heparin infusion. Cardiology consult ordered and diagnosed with NSTEMI. Transferred to [facility] for right heart cath. Found to have multivessel CAD, will be evaluated for surgery. Continuing heparin drip post cath.  Heparin level this evening is therapeutic at 0.51 on 1500 units/hr. No s/s of overt bleeding reported per RN  Goal of Therapy:  Heparin level 0.3-0.7 units/ml Monitor  platelets by anticoagulation protocol: Yes   Plan:  Continue heparin infusion at 1500 units/hr. Monitor daily HL, CBC  Albertina Parr, PharmD., BCPS, BCCCP Clinical Pharmacist Please refer to [facility] for unit-specific pharmacist

## 2020-03-12 ENCOUNTER — Inpatient Hospital Stay (HOSPITAL_COMMUNITY): Payer: Medicaid Other

## 2020-03-12 DIAGNOSIS — Z01810 Encounter for preprocedural cardiovascular examination: Secondary | ICD-10-CM

## 2020-03-12 DIAGNOSIS — E1159 Type 2 diabetes mellitus with other circulatory complications: Secondary | ICD-10-CM | POA: Diagnosis not present

## 2020-03-12 DIAGNOSIS — I5021 Acute systolic (congestive) heart failure: Secondary | ICD-10-CM

## 2020-03-12 DIAGNOSIS — I25110 Atherosclerotic heart disease of native coronary artery with unstable angina pectoris: Secondary | ICD-10-CM | POA: Diagnosis not present

## 2020-03-12 DIAGNOSIS — E782 Mixed hyperlipidemia: Secondary | ICD-10-CM

## 2020-03-12 DIAGNOSIS — Z794 Long term (current) use of insulin: Secondary | ICD-10-CM

## 2020-03-12 DIAGNOSIS — I10 Essential (primary) hypertension: Secondary | ICD-10-CM | POA: Diagnosis not present

## 2020-03-12 LAB — CBC
HCT: 39.2 % (ref 39.0–52.0)
Hemoglobin: 12.7 g/dL — ABNORMAL LOW (ref 13.0–17.0)
MCH: 28.5 pg (ref 26.0–34.0)
MCHC: 32.4 g/dL (ref 30.0–36.0)
MCV: 88.1 fL (ref 80.0–100.0)
Platelets: 346 10*3/uL (ref 150–400)
RBC: 4.45 MIL/uL (ref 4.22–5.81)
RDW: 13.2 % (ref 11.5–15.5)
WBC: 15.7 10*3/uL — ABNORMAL HIGH (ref 4.0–10.5)
nRBC: 0 % (ref 0.0–0.2)

## 2020-03-12 LAB — GLUCOSE, CAPILLARY
Glucose-Capillary: 183 mg/dL — ABNORMAL HIGH (ref 70–99)
Glucose-Capillary: 185 mg/dL — ABNORMAL HIGH (ref 70–99)
Glucose-Capillary: 199 mg/dL — ABNORMAL HIGH (ref 70–99)
Glucose-Capillary: 222 mg/dL — ABNORMAL HIGH (ref 70–99)
Glucose-Capillary: 229 mg/dL — ABNORMAL HIGH (ref 70–99)
Glucose-Capillary: 263 mg/dL — ABNORMAL HIGH (ref 70–99)

## 2020-03-12 LAB — BASIC METABOLIC PANEL
Anion gap: 12 (ref 5–15)
BUN: 26 mg/dL — ABNORMAL HIGH (ref 6–20)
CO2: 24 mmol/L (ref 22–32)
Calcium: 9.8 mg/dL (ref 8.9–10.3)
Chloride: 98 mmol/L (ref 98–111)
Creatinine, Ser: 1.01 mg/dL (ref 0.61–1.24)
GFR calc Af Amer: >60 mL/min (ref >60)
GFR calc non Af Amer: >60 mL/min (ref >60)
Glucose, Bld: 208 mg/dL — ABNORMAL HIGH (ref 70–99)
Potassium: 4.2 mmol/L (ref 3.5–5.1)
Sodium: 134 mmol/L — ABNORMAL LOW (ref 135–145)

## 2020-03-12 LAB — MAGNESIUM: Magnesium: 1.8 mg/dL (ref 1.7–2.4)

## 2020-03-12 LAB — PULMONARY FUNCTION TEST
FEF 25-75 Pre: 2.46 L/sec
FEF2575-%Pred-Pre: 81 %
FEV1-%Pred-Pre: 58 %
FEV1-Pre: 2.1 L
FEV1FVC-%Pred-Pre: 107 %
FEV6-%Pred-Pre: 56 %
FEV6-Pre: 2.55 L
FEV6FVC-%Pred-Pre: 104 %
FVC-%Pred-Pre: 54 %
FVC-Pre: 2.55 L
Pre FEV1/FVC ratio: 82 %
Pre FEV6/FVC Ratio: 100 %

## 2020-03-12 LAB — BLOOD GAS, ARTERIAL
Acid-Base Excess: 1 mmol/L (ref 0.0–2.0)
Bicarbonate: 25.9 mmol/L (ref 20.0–28.0)
Drawn by: 519031
FIO2: 36
O2 Saturation: 99 %
Patient temperature: 36.4
pCO2 arterial: 45.9 mmHg (ref 32.0–48.0)
pH, Arterial: 7.366 (ref 7.350–7.450)
pO2, Arterial: 141 mmHg — ABNORMAL HIGH (ref 83.0–108.0)

## 2020-03-12 LAB — ECHOCARDIOGRAM LIMITED
Height: 69 in
Weight: 4550.4 oz

## 2020-03-12 LAB — HEPARIN LEVEL (UNFRACTIONATED): Heparin Unfractionated: 0.47 IU/mL (ref 0.30–0.70)

## 2020-03-12 LAB — TYPE AND SCREEN
ABO/RH(D): A POS
Antibody Screen: NEGATIVE

## 2020-03-12 LAB — ABO/RH: ABO/RH(D): A POS

## 2020-03-12 IMAGING — CR DG CHEST 2V
2 series · 2 of 2 positions shown · non-contrast
Comparison: [DATE]

CLINICAL DATA: Extreme shortness of breath. Recent diagnosis of non
ST-elevation MI.

EXAM:
CHEST - 2 VIEW

[chest lat]
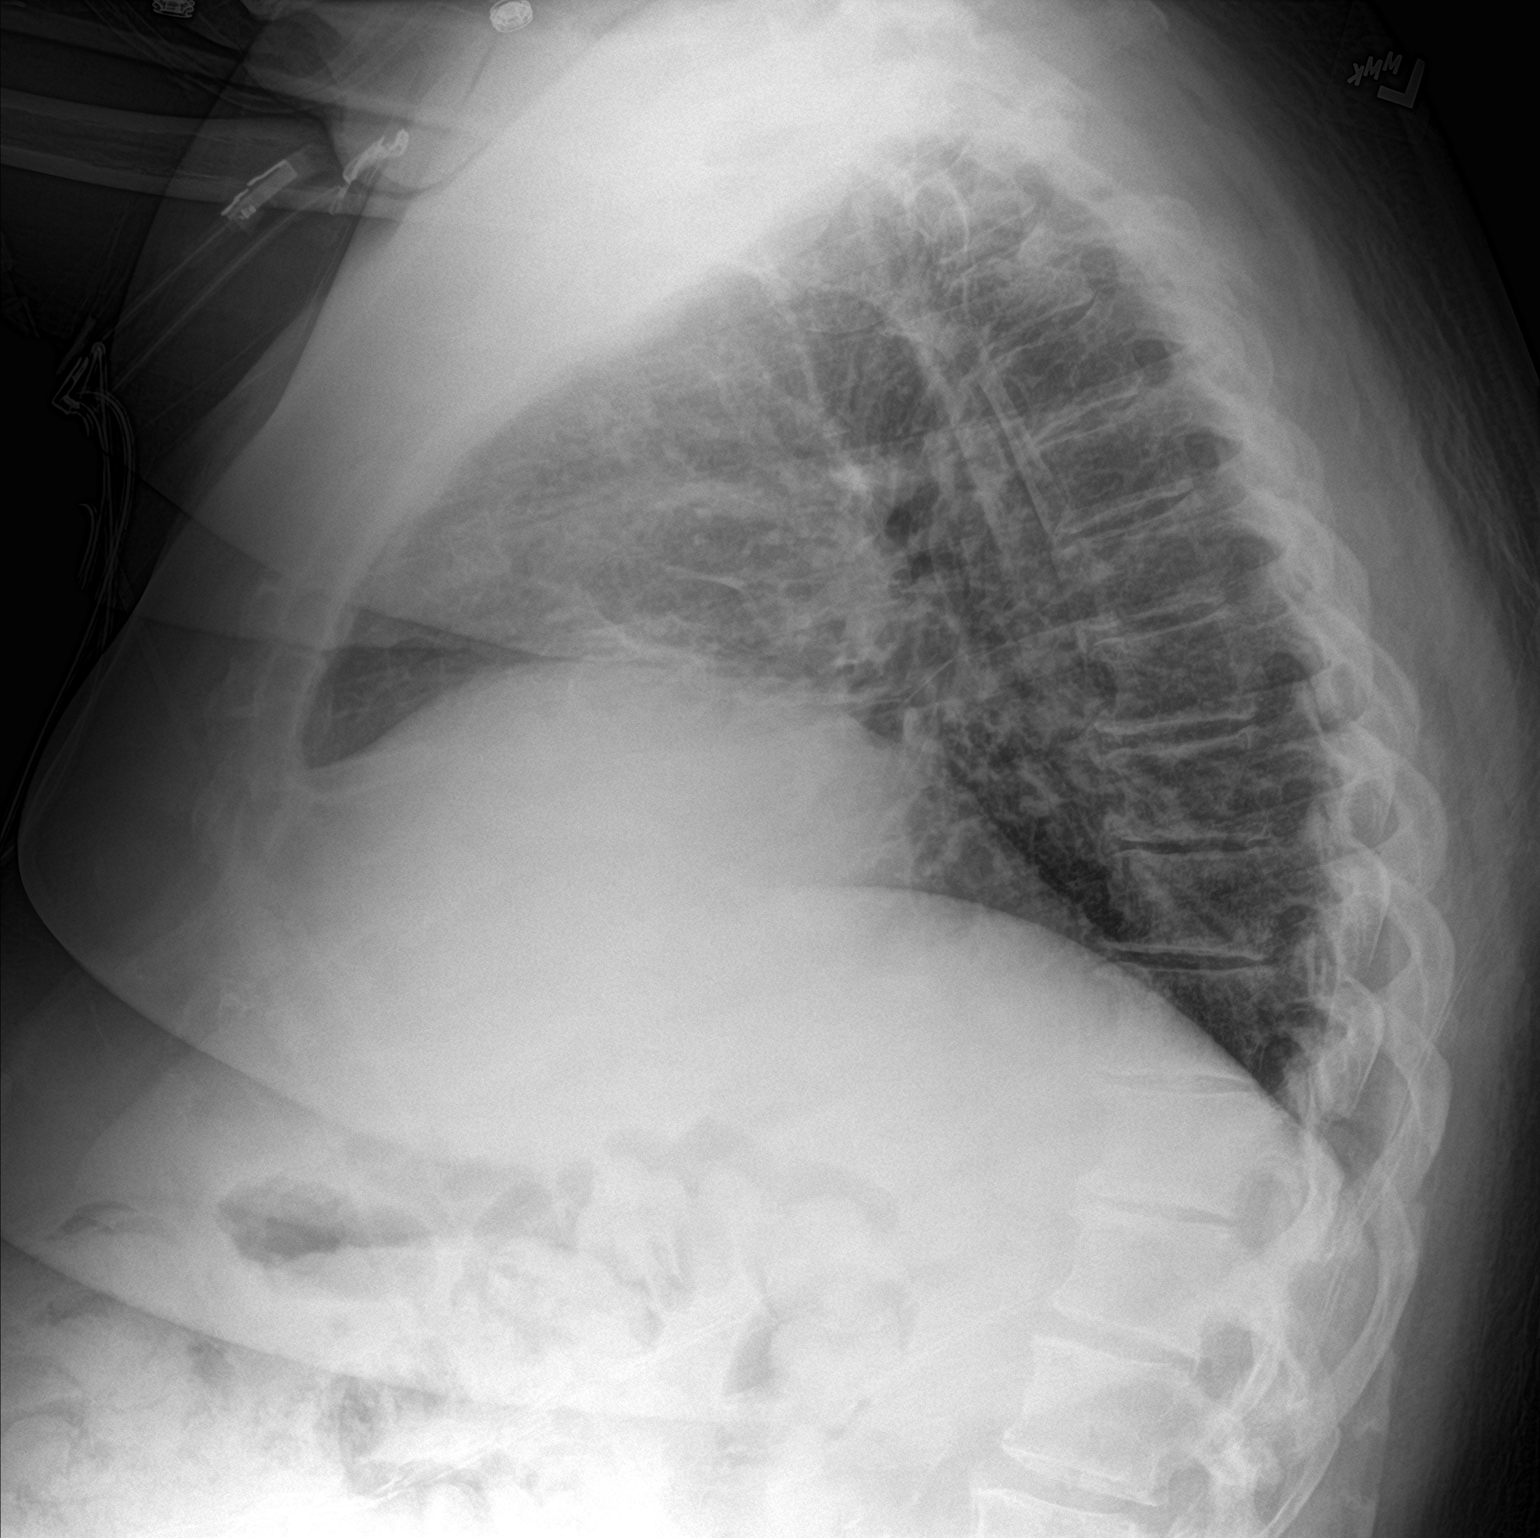

[chest ap]
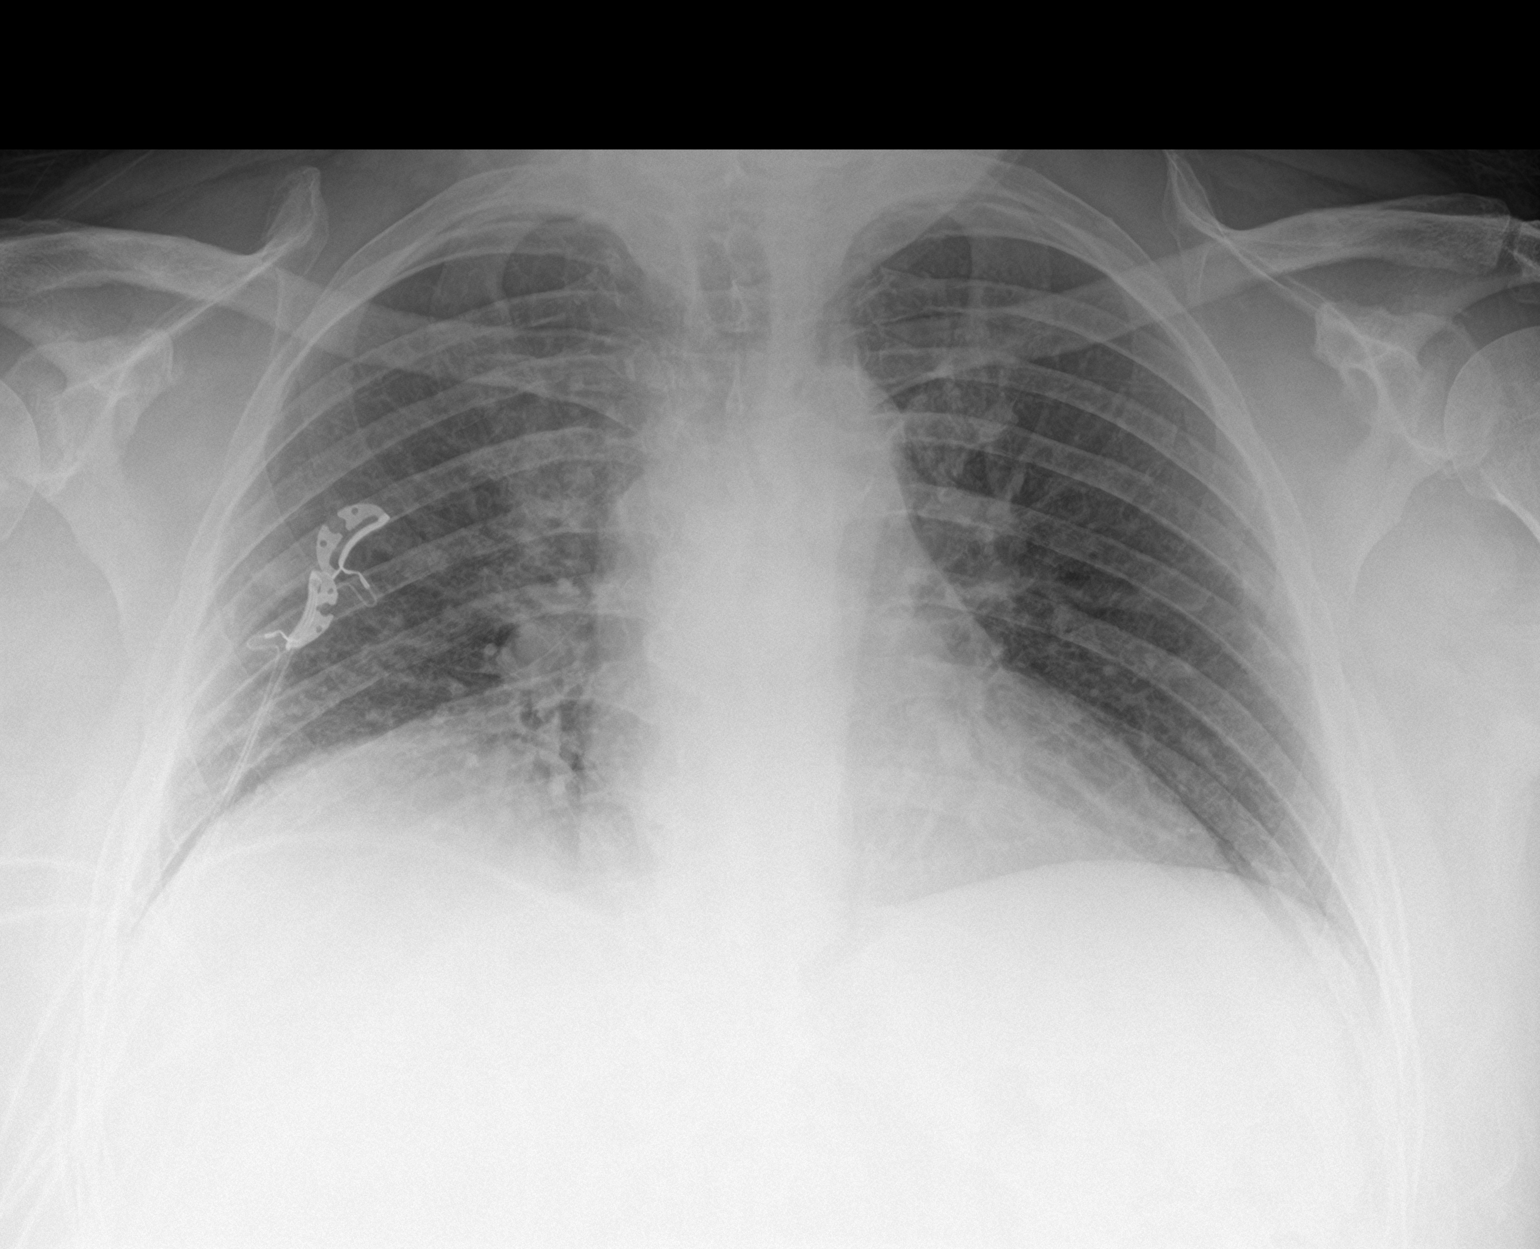

[2 of 2 positions shown; findings below may reference images not displayed]

FINDINGS: Normal heart size. Decreased lung volumes. Mild subsegmental
atelectasis within the right lung base noted. Similar to previous
exam. No pleural effusion or edema.
IMPRESSION: Decreased lung volumes with persistent right base atelectasis.

## 2020-03-12 MED ORDER — TRANEXAMIC ACID (OHS) BOLUS VIA INFUSION
15.0000 mg/kg | INTRAVENOUS | Status: AC
  Administered 2020-03-13: 1935 mg via INTRAVENOUS
  Filled 2020-03-12: qty 1935

## 2020-03-12 MED ORDER — INSULIN GLARGINE 100 UNIT/ML ~~LOC~~ SOLN
19.0000 [IU] | Freq: Two times a day (BID) | SUBCUTANEOUS | Status: DC
  Administered 2020-03-12 (×2): 19 [IU] via SUBCUTANEOUS
  Filled 2020-03-12 (×3): qty 0.19

## 2020-03-12 MED ORDER — PERFLUTREN LIPID MICROSPHERE
INTRAVENOUS | Status: AC
  Filled 2020-03-12: qty 10

## 2020-03-12 MED ORDER — SODIUM CHLORIDE 0.9 % IV SOLN
INTRAVENOUS | Status: DC
  Filled 2020-03-12: qty 30

## 2020-03-12 MED ORDER — VANCOMYCIN HCL 1500 MG/300ML IV SOLN
1500.0000 mg | INTRAVENOUS | Status: AC
  Administered 2020-03-13: 1500 mg via INTRAVENOUS
  Filled 2020-03-12: qty 300

## 2020-03-12 MED ORDER — EPINEPHRINE HCL 5 MG/250ML IV SOLN IN NS
0.0000 ug/min | INTRAVENOUS | Status: AC
  Administered 2020-03-13: 1 ug/min via INTRAVENOUS
  Filled 2020-03-12: qty 250

## 2020-03-12 MED ORDER — INSULIN ASPART 100 UNIT/ML ~~LOC~~ SOLN
0.0000 [IU] | Freq: Three times a day (TID) | SUBCUTANEOUS | Status: DC
  Administered 2020-03-12: 11 [IU] via SUBCUTANEOUS
  Administered 2020-03-12: 7 [IU] via SUBCUTANEOUS

## 2020-03-12 MED ORDER — INSULIN ASPART 100 UNIT/ML ~~LOC~~ SOLN: 0.0000 [IU] | Freq: Every day | SUBCUTANEOUS | Status: DC

## 2020-03-12 MED ORDER — PLASMA-LYTE 148 IV SOLN
INTRAVENOUS | Status: DC
  Filled 2020-03-12: qty 2.5

## 2020-03-12 MED ORDER — MUPIROCIN 2 % EX OINT
1.0000 "application " | TOPICAL_OINTMENT | Freq: Two times a day (BID) | CUTANEOUS | Status: DC
  Administered 2020-03-13: 1 via NASAL
  Filled 2020-03-12: qty 22

## 2020-03-12 MED ORDER — DEXMEDETOMIDINE HCL IN NACL 400 MCG/100ML IV SOLN
0.1000 ug/kg/h | INTRAVENOUS | Status: AC
  Administered 2020-03-13: .2 ug/kg/h via INTRAVENOUS
  Filled 2020-03-12: qty 100

## 2020-03-12 MED ORDER — PERFLUTREN LIPID MICROSPHERE
1.0000 mL | INTRAVENOUS | Status: AC | PRN
  Administered 2020-03-12: 4 mL via INTRAVENOUS
  Filled 2020-03-12: qty 10

## 2020-03-12 MED ORDER — NOREPINEPHRINE 4 MG/250ML-% IV SOLN
0.0000 ug/min | INTRAVENOUS | Status: DC
  Filled 2020-03-12: qty 250

## 2020-03-12 MED ORDER — NITROGLYCERIN IN D5W 200-5 MCG/ML-% IV SOLN
2.0000 ug/min | INTRAVENOUS | Status: AC
  Administered 2020-03-13: 20 ug/min via INTRAVENOUS
  Filled 2020-03-12: qty 250

## 2020-03-12 MED ORDER — TEMAZEPAM 15 MG PO CAPS
15.0000 mg | ORAL_CAPSULE | Freq: Once | ORAL | Status: AC | PRN
  Administered 2020-03-12: 15 mg via ORAL
  Filled 2020-03-12: qty 1

## 2020-03-12 MED ORDER — BISACODYL 5 MG PO TBEC
5.0000 mg | DELAYED_RELEASE_TABLET | Freq: Once | ORAL | Status: AC
  Administered 2020-03-12: 5 mg via ORAL
  Filled 2020-03-12: qty 1

## 2020-03-12 MED ORDER — TRANEXAMIC ACID 1000 MG/10ML IV SOLN
1.5000 mg/kg/h | INTRAVENOUS | Status: AC
  Administered 2020-03-13: 1.5 mg/kg/h via INTRAVENOUS
  Filled 2020-03-12: qty 25

## 2020-03-12 MED ORDER — CHLORHEXIDINE GLUCONATE CLOTH 2 % EX PADS
6.0000 | MEDICATED_PAD | Freq: Once | CUTANEOUS | Status: AC
  Administered 2020-03-12: 6 via TOPICAL

## 2020-03-12 MED ORDER — INSULIN REGULAR(HUMAN) IN NACL 100-0.9 UT/100ML-% IV SOLN
INTRAVENOUS | Status: AC
  Administered 2020-03-13: 10 [IU]/h via INTRAVENOUS
  Filled 2020-03-12: qty 100

## 2020-03-12 MED ORDER — CHLORHEXIDINE GLUCONATE 0.12 % MT SOLN
15.0000 mL | Freq: Once | OROMUCOSAL | Status: AC
  Administered 2020-03-13: 15 mL via OROMUCOSAL
  Filled 2020-03-12: qty 15

## 2020-03-12 MED ORDER — METOPROLOL TARTRATE 12.5 MG HALF TABLET
12.5000 mg | ORAL_TABLET | Freq: Once | ORAL | Status: AC
  Administered 2020-03-13: 12.5 mg via ORAL
  Filled 2020-03-12: qty 1

## 2020-03-12 MED ORDER — POTASSIUM CHLORIDE 2 MEQ/ML IV SOLN
80.0000 meq | INTRAVENOUS | Status: DC
  Filled 2020-03-12: qty 40

## 2020-03-12 MED ORDER — MAGNESIUM SULFATE 50 % IJ SOLN
40.0000 meq | INTRAMUSCULAR | Status: DC
  Filled 2020-03-12: qty 9.85

## 2020-03-12 MED ORDER — LEVOFLOXACIN IN D5W 500 MG/100ML IV SOLN
500.0000 mg | INTRAVENOUS | Status: AC
  Administered 2020-03-13: 500 mg via INTRAVENOUS
  Filled 2020-03-12: qty 100

## 2020-03-12 MED ORDER — CHLORHEXIDINE GLUCONATE CLOTH 2 % EX PADS
6.0000 | MEDICATED_PAD | Freq: Once | CUTANEOUS | Status: AC
  Administered 2020-03-13: 6 via TOPICAL

## 2020-03-12 MED ORDER — PHENYLEPHRINE HCL-NACL 20-0.9 MG/250ML-% IV SOLN
30.0000 ug/min | INTRAVENOUS | Status: DC
  Filled 2020-03-12: qty 250

## 2020-03-12 MED ORDER — TRANEXAMIC ACID (OHS) PUMP PRIME SOLUTION
2.0000 mg/kg | INTRAVENOUS | Status: DC
  Filled 2020-03-12: qty 2.58

## 2020-03-12 MED ORDER — MILRINONE LACTATE IN DEXTROSE 20-5 MG/100ML-% IV SOLN
0.3000 ug/kg/min | INTRAVENOUS | Status: DC
  Filled 2020-03-12: qty 100

## 2020-03-12 NOTE — Progress Notes (Signed)
[place] for Heparin Indication: chest pain/ACS  Allergies  Allergen Reactions  . Ace Inhibitors Swelling and Cough  . Other Itching    [name]  . Penicillins     Has patient had a PCN reaction causing immediate rash, facial/tongue/throat swelling, SOB or lightheadedness with hypotension: Unknown Has patient had a PCN reaction causing severe rash involving mucus membranes or skin necrosis: Unknown Has patient had a PCN reaction that required hospitalization: Unknown Has patient had a PCN reaction occurring within the last 10 years: Unknown If all of the above answers are "NO", then may proceed with Cephalosporin use.     Patient Measurements: Height: 5\' 9"  (175.3 cm) Weight: 129 kg (284 lb 6.4 oz) IBW/kg (Calculated) : 70.7 Heparin Dosing Weight: 102 kg  Vital Signs: Temp: 97.6 F (36.4 C) (05/03 0541) Temp Source: Oral (05/03 0541) BP: 127/84 (05/03 0541) Pulse Rate: 72 (05/03 0541)  Labs: Recent Labs    03/10/20 0240 03/10/20 0240 03/11/20 0457 03/11/20 0457 03/11/20 1138 03/11/20 1830 03/12/20 0503  HGB 12.3*   < > 13.0  --   --   --  12.7*  HCT 38.5*  --  40.4  --   --   --  39.2  PLT 318  --  375  --   --   --  346  HEPARINUNFRC 0.60   < > 1.08*   < > 0.90* 0.51 0.47  CREATININE 1.19  --  1.07  --   --   --  1.01   < > = values in this interval not displayed.    Estimated Creatinine Clearance: 107.3 mL/min (by C-G formula based on SCr of 1.01 mg/dL).   Assessment: 59 y/o M admitted with ShOB and chest pain. Pharmacy consulted to initiate heparin infusion. Cardiology consult ordered and diagnosed with NSTEMI. Transferred to [facility] for right heart cath. Found to have multivessel CAD, will be evaluated for surgery. Continuing heparin drip post cath.  Heparin level this morning came back supratherapeutic at 0.47, on 1500 units/hr. Hgb 12.7, plt 346. No s/sx of bleeding or infusion issues. Level drawn appropriately.    Goal of Therapy:  Heparin level 0.3-0.7 units/ml Monitor platelets by anticoagulation protocol: Yes   Plan:  Continue heparin infusion at 1500 units/hr. Monitor daily HL, CBC Plan for CABG on 5/4   Antonietta Jewel, PharmD, [place] Pharmacist  Phone: [phone] 03/12/2020 7:48 AM  Please check AMION for all [place] phone numbers After 10:00 PM, call [place] [phone]

## 2020-03-12 NOTE — Progress Notes (Signed)
PROGRESS NOTE    Shane Chambers  [id number] DOB: [birth year] DOA: 03/05/2020 PCP: [name], MD   Brief Narrative:  59 year old with history of DM2, peripheral neuropathy, HTN, BPH, presented to [facility] with acute diastolic CHF, elevated troponin.  Seen by cardiology and transferred to [facility] for further management.  LHC 4/28 performed showed severe multivessel CAD with elevated filling pressures, poor candidate for CABG, high risk for PCI.  CT surgery consulted   Assessment & Plan:   Principal Problem:   Acute on chronic combined systolic and diastolic CHF (congestive heart failure) (HCC) Active Problems:   SOB (shortness of breath)   Hypertension   Type 2 diabetes mellitus (HCC)   Hyperlipidemia   BPH (benign prostatic hyperplasia)   Acute respiratory failure with hypoxia (HCC)   Acute hyponatremia   Ischemic cardiomyopathy   Coronary artery disease  Acute hypoxic respiratory failure Acute congestive heart failure with reduced ejection fraction, 20%, grade 3 DD Multivessel coronary artery disease -Continue diuretics per cardiology team -LHC-severe multivessel CAD with elevated filling pressures.  Has surgical anatomy but poor candidate for CABG, high risk PCI. -Seen by cardiology, CT surgery to see the patient today -Continue nitroglycerin drip for chest discomfort/angina -A1c 10.2, LDL 92.   -CTA chest-negative for PE -Continue aspirin, statin, beta-blocker  Diabetes mellitus type 2, poorly controlled due to hyperglycemia -Insulin sliding scale Accu-Chek, A1c 10.2 -Increase Lantus to 19 units twice daily -Gabapentin 900 mg twice daily  Essential hypertension -On IV Lasix -Toprol-XL 100 mg daily  Hyperlipidemia -Statin-Lipitor 80 mg at bedtime  BPH -Flomax  Morbid obesity with BMI greater than 40 -Diet and weight loss  Intermittent hallucinations Psychiatry consulted   DVT prophylaxis: Heparin drip Code Status: Full  code Family Communication:    Status is: Inpatient  Remains inpatient appropriate because:Ongoing diagnostic testing needed not appropriate for outpatient work up.  Still ongoing evaluation for advanced CAD likely will require CABG.  Also optimizing respiratory status by giving IV diuretics   Dispo: The patient is from: Home              Anticipated d/c is to: Home              Anticipated d/c date is: 1 day              Patient currently is not medically stable to d/c.   Subjective: Feels a little lousy this morning and some exertional shortness of breath but denies any chest pain or other complaints. He is doing well with incentive spirometer  Review of Systems Otherwise negative except as per HPI, including: General: Denies fever, chills, night sweats or unintended weight loss. Resp: Denies cough, wheezing, shortness of breath. Cardiac: Denies chest pain, palpitations, orthopnea, paroxysmal nocturnal dyspnea. GI: Denies abdominal pain, nausea, vomiting, diarrhea or constipation GU: Denies dysuria, frequency, hesitancy or incontinence MS: Denies muscle aches, joint pain or swelling Neuro: Denies headache, neurologic deficits (focal weakness, numbness, tingling), abnormal gait Psych: Denies anxiety, depression, SI/HI/AVH Skin: Denies new rashes or lesions ID: Denies sick contacts, exotic exposures, travel  Examination: Constitutional: Not in acute distress, morbidly obese on 2 L nasal cannula Respiratory: Mild bibasilar crackles Cardiovascular: Normal sinus rhythm, no rubs Abdomen: Nontender nondistended good bowel sounds Musculoskeletal: No edema noted Skin: Right toe small area of ulceration noted-suspect pressure sore Neurologic: CN 2-12 grossly intact.  And nonfocal Psychiatric: Normal judgment and insight. Alert and oriented x 3. Normal mood.   Objective: Vitals:  03/11/20 1947 03/12/20 0036 03/12/20 0541 03/12/20 0818  BP: 134/72 110/65 127/84 (!) 141/77  Pulse:  70 71 72 72  Resp: 18 17 12    Temp: 97.6 F (36.4 C) 98.2 F (36.8 C) 97.6 F (36.4 C) 97.7 F (36.5 C)  TempSrc: Oral Oral Oral Oral  SpO2: 100% 99% 97% 99%  Weight:   129 kg   Height:        Intake/Output Summary (Last 24 hours) at 03/12/2020 0824 Last data filed at 03/12/2020 0631 Gross per 24 hour  Intake 1489 ml  Output 2980 ml  Net -1491 ml   Filed Weights   03/10/20 0441 03/11/20 0532 03/12/20 0541  Weight: 108.1 kg 129.7 kg 129 kg     Data Reviewed:   CBC: Recent Labs  Lab 03/05/20 1330 03/05/20 1330 03/06/20 0412 03/07/20 0412 03/08/20 0253 03/09/20 0337 03/10/20 0240 03/11/20 0457 03/12/20 0503  WBC 12.6*   < > 10.7*   < > 13.6* 13.0* 13.0* 15.8* 15.7*  NEUTROABS 9.2*  --  9.5*  --   --   --   --   --   --   HGB 11.1*   < > 12.3*   < > 11.9* 11.9* 12.3* 13.0 12.7*  HCT 35.1*   < > 38.1*   < > 37.0* 36.9* 38.5* 40.4 39.2  MCV 90.2   < > 89.0   < > 90.5 89.8 88.7 89.4 88.1  PLT 280   < > 299   < > 325 310 318 375 346   < > = values in this interval not displayed.   Basic Metabolic Panel: Recent Labs  Lab 03/06/20 0412 03/06/20 0412 03/07/20 0412 03/07/20 1633 03/08/20 0802 03/09/20 0337 03/10/20 0240 03/11/20 0457 03/12/20 0503  NA 133*   < > 134*   < > 138 137 136 134* 134*  K 4.0   < > 3.4*   < > 3.8 3.3* 3.8 4.3 4.2  CL 93*   < > 93*  --  99 96* 96* 98 98  CO2 25   < > 28  --  30 29 23 24 24   GLUCOSE 424*   < > 228*  --  180* 115* 232* 193* 208*  BUN 24*   < > 36*  --  24* 24* 29* 29* 26*  CREATININE 0.88   < > 1.07  --  0.99 1.16 1.19 1.07 1.01  CALCIUM 9.7   < > 9.4  --  9.5 9.4 9.7 9.6 9.8  MG 1.7   < > 2.0  --   --  1.7 1.7 1.8 1.8  PHOS 5.6*  --   --   --   --   --   --   --   --    < > = values in this interval not displayed.   GFR: Estimated Creatinine Clearance: 107.3 mL/min (by C-G formula based on SCr of 1.01 mg/dL). Liver Function Tests: Recent Labs  Lab 03/05/20 1330  AST 36  ALT 21  ALKPHOS 113  BILITOT 1.1  PROT  7.4  ALBUMIN 3.7   No results for input(s): LIPASE, AMYLASE in the last 168 hours. No results for input(s): AMMONIA in the last 168 hours. Coagulation Profile: Recent Labs  Lab 03/06/20 0412 03/06/20 0648  INR 1.1 1.1   Cardiac Enzymes: No results for input(s): CKTOTAL, CKMB, CKMBINDEX, TROPONINI in the last 168 hours. BNP (last 3 results) No results for input(s): PROBNP in the last  8760 hours. HbA1C: No results for input(s): HGBA1C in the last 72 hours. CBG: Recent Labs  Lab 03/11/20 1634 03/11/20 2038 03/12/20 0030 03/12/20 0538 03/12/20 0811  GLUCAP 218* 285* 222* 183* 185*   Lipid Profile: No results for input(s): CHOL, HDL, LDLCALC, TRIG, CHOLHDL, LDLDIRECT in the last 72 hours. Thyroid Function Tests: No results for input(s): TSH, T4TOTAL, FREET4, T3FREE, THYROIDAB in the last 72 hours. Anemia Panel: No results for input(s): VITAMINB12, FOLATE, FERRITIN, TIBC, IRON, RETICCTPCT in the last 72 hours. Sepsis Labs: No results for input(s): PROCALCITON, LATICACIDVEN in the last 168 hours.  Recent Results (from the past 240 hour(s))  Respiratory Panel by RT PCR (Flu A&B, Covid) - Nasopharyngeal Swab     Status: None   Collection Time: 03/05/20  1:29 PM   Specimen: Nasopharyngeal Swab  Result Value Ref Range Status   SARS Coronavirus 2 by RT PCR NEGATIVE NEGATIVE Final    Comment: (NOTE) SARS-CoV-2 target nucleic acids are NOT DETECTED. The SARS-CoV-2 RNA is generally detectable in upper respiratoy specimens during the acute phase of infection. The lowest concentration of SARS-CoV-2 viral copies this assay can detect is 131 copies/mL. A negative result does not preclude SARS-Cov-2 infection and should not be used as the sole basis for treatment or other patient management decisions. A negative result may occur with  improper specimen collection/handling, submission of specimen other than nasopharyngeal swab, presence of viral mutation(s) within the areas targeted  by this assay, and inadequate number of viral copies (<131 copies/mL). A negative result must be combined with clinical observations, patient history, and epidemiological information. The expected result is Negative. Fact Sheet for Patients:  PinkCheek.be Fact Sheet for Healthcare Providers:  GravelBags.it This test is not yet ap proved or cleared by the [place] FDA and  has been authorized for detection and/or diagnosis of SARS-CoV-2 by FDA under an Emergency Use Authorization (EUA). This EUA will remain  in effect (meaning this test can be used) for the duration of the COVID-19 declaration under Section 564(b)(1) of the Act, 21 U.S.C. section 360bbb-3(b)(1), unless the authorization is terminated or revoked sooner.    Influenza A by PCR NEGATIVE NEGATIVE Final   Influenza B by PCR NEGATIVE NEGATIVE Final    Comment: (NOTE) The Xpert Xpress SARS-CoV-2/FLU/RSV assay is intended as an aid in  the diagnosis of influenza from Nasopharyngeal swab specimens and  should not be used as a sole basis for treatment. Nasal washings and  aspirates are unacceptable for Xpert Xpress SARS-CoV-2/FLU/RSV  testing. Fact Sheet for Patients: PinkCheek.be Fact Sheet for Healthcare Providers: GravelBags.it This test is not yet approved or cleared by the [place] FDA and  has been authorized for detection and/or diagnosis of SARS-CoV-2 by  FDA under an Emergency Use Authorization (EUA). This EUA will remain  in effect (meaning this test can be used) for the duration of the  Covid-19 declaration under Section 564(b)(1) of the Act, 21  U.S.C. section 360bbb-3(b)(1), unless the authorization is  terminated or revoked. Performed at [facility], [address], [place], [place] [address]   MRSA PCR Screening     Status: None   Collection Time: 03/06/20 12:36 AM   Specimen: Nasal  Mucosa; Nasopharyngeal  Result Value Ref Range Status   MRSA by PCR NEGATIVE NEGATIVE Final    Comment:        The GeneXpert MRSA Assay (FDA approved for NASAL specimens only), is one component of a comprehensive MRSA colonization surveillance program. It is not  intended to diagnose MRSA infection nor to guide or monitor treatment for MRSA infections. Performed at [facility], [address] [place], [place] [address]          Radiology Studies: No results found.      Scheduled Meds: . aspirin EC  81 mg Oral Daily  . atorvastatin  80 mg Oral QHS  . bisacodyl  5 mg Oral Once  . ferrous sulfate  325 mg Oral Q breakfast  . FLUoxetine  10 mg Oral Daily  . furosemide  40 mg Oral Daily  . gabapentin  900 mg Oral BID  . hydrALAZINE  10 mg Oral Q8H  . insulin aspart  0-20 Units Subcutaneous Q4H  . insulin aspart  5 Units Subcutaneous TID WC  . insulin glargine  16 Units Subcutaneous BID  . metoprolol  100 mg Oral Daily  . [START ON 03/13/2020] metoprolol tartrate  12.5 mg Oral Once  . sodium chloride flush  3 mL Intravenous Q12H  . sodium chloride flush  3 mL Intravenous Q12H  . sodium chloride flush  3 mL Intravenous Q12H  . spironolactone  12.5 mg Oral Daily  . tamsulosin  0.4 mg Oral Daily  . venlafaxine XR  112.5 mg Oral Q breakfast   Continuous Infusions: . sodium chloride    . heparin 1,500 Units/hr (03/11/20 2040)  . nitroGLYCERIN 20 mcg/min (03/11/20 0441)     LOS: 7 days   Time spent= 35 mins    Gayanne Prescott Arsenio Loader, MD [facility]  If 7PM-7AM, please contact night-coverage  03/12/2020, 8:24 AM

## 2020-03-12 NOTE — Progress Notes (Signed)
Pre- cabg has been completed.   Preliminary results in CV Proc.   Abram Sander 03/12/2020 4:38 PM

## 2020-03-12 NOTE — Anesthesia Preprocedure Evaluation (Addendum)
Anesthesia Evaluation  Patient identified by MRN, date of birth, ID band Patient awake    Reviewed: Allergy & Precautions, NPO status , Patient's Chart, lab work & pertinent test results  History of Anesthesia Complications Negative for: history of anesthetic complications  Airway Mallampati: III  TM Distance: >3 FB Neck ROM: Full    Dental  (+) Poor Dentition, Missing, Chipped, Loose, Dental Advisory Given   Pulmonary COPD,  COPD inhaler, former smoker,  03/05/2020 SARS coronavirus NEG   breath sounds clear to auscultation       Cardiovascular hypertension, Pt. on medications + angina + CAD (40% LM with severe 3v ASCAD) and +CHF   Rhythm:Regular Rate:Normal  03/12/2020 ECHO: EF 35-40%, valves OK   Neuro/Psych Depression negative neurological ROS     GI/Hepatic negative GI ROS, Neg liver ROS,   Endo/Other  diabetes (glu 205), Insulin DependentMorbid obesity  Renal/GU negative Renal ROS     Musculoskeletal   Abdominal (+) + obese,   Peds  Hematology negative hematology ROS (+)   Anesthesia Other Findings   Reproductive/Obstetrics                            Anesthesia Physical Anesthesia Plan  ASA: IV  Anesthesia Plan: General   Post-op Pain Management:    Induction: Intravenous  PONV Risk Score and Plan: 2 and Treatment may vary due to age or medical condition  Airway Management Planned: Oral ETT  Additional Equipment: Arterial line, PA Cath, TEE and Ultrasound Guidance Line Placement  Intra-op Plan:   Post-operative Plan: Post-operative intubation/ventilation  Informed Consent: I have reviewed the patients History and Physical, chart, labs and discussed the procedure including the risks, benefits and alternatives for the proposed anesthesia with the patient or authorized representative who has indicated his/her understanding and acceptance.     Dental advisory given  Plan  Discussed with: CRNA and Surgeon  Anesthesia Plan Comments:        Anesthesia Quick Evaluation

## 2020-03-12 NOTE — Progress Notes (Signed)
  Echocardiogram 2D Echocardiogram limited with definity has been performed.  Darlina Sicilian M 03/12/2020, 9:17 AM

## 2020-03-12 NOTE — Consult Note (Signed)
TCTS Preliminary Consult Note  Kindly asked to see 59 yo man for consideration of CABG. Pt has been seen and examined; films and chart reviewed. Full consult report to follow, pending completion of pre-operative workup.  He presented with extreme shortness of breath and was diagnosed with NSTEMI. LHC shows severe multivessel CAD and depressed LV function. Tentatively plan CABG on 03/13/20.  Amily Depp Z. Orvan Seen, [place]

## 2020-03-12 NOTE — Progress Notes (Signed)
Discussed sternal precautions, IS (2250 mL), mobility post op, and d/c planning. Pt receptive but sleepy, nodding off a couple times. Gave him materials to read, preop video to watch.  [id number] [place], ACSM 10:19 AM 03/12/2020

## 2020-03-12 NOTE — Progress Notes (Signed)
Inpatient Diabetes Program Recommendations  AACE/ADA: New Consensus Statement on Inpatient Glycemic Control (2015)  Target Ranges:  Prepandial:   less than 140 mg/dL      Peak postprandial:   less than 180 mg/dL (1-2 hours)      Critically ill patients:  140 - 180 mg/dL   Lab Results  Component Value Date   GLUCAP 185 (H) 03/12/2020   HGBA1C 10.2 (H) 03/06/2020    Review of Glycemic Control Results for Shane Chambers, Shane Chambers (MRN [id number]) as of 03/12/2020 10:06  Ref. Range 03/11/2020 20:38 03/12/2020 00:30 03/12/2020 05:38 03/12/2020 08:11  Glucose-Capillary Latest Ref Range: 70 - 99 mg/dL 285 (H) 222 (H) 183 (H) 185 (H)   Diabetes history: DM 2 Outpatient Diabetes medications: 70/30 65 units bid, Metformin 1000 mg bid Current orders for Inpatient glycemic control:  Lantus 19 units bid Novolog 0-20 units Q4 hours Novolog 5 units TID  Inpatient Diabetes Program Recommendations:    Consider switching correction to Novolog 0-20 units TID & HS.   Thanks, Bronson Curb, MSN, RNC-OB Diabetes Coordinator [phone] (8a-5p)

## 2020-03-12 NOTE — Progress Notes (Signed)
RT NOTES:  ABG obtained and sent to lab. Lab tech Kim notified. 

## 2020-03-12 NOTE — Progress Notes (Addendum)
Progress Note  Patient Name: Shane Chambers Date of Encounter: 03/12/2020  Primary Cardiologist: Carlyle Dolly, MD   Subjective   Still having some chest pain and SOB. No significant change from previous. No palpitations  Inpatient Medications    Scheduled Meds: . aspirin EC  81 mg Oral Daily  . atorvastatin  80 mg Oral QHS  . ferrous sulfate  325 mg Oral Q breakfast  . FLUoxetine  10 mg Oral Daily  . furosemide  40 mg Oral Daily  . gabapentin  900 mg Oral BID  . hydrALAZINE  10 mg Oral Q8H  . insulin aspart  0-20 Units Subcutaneous Q4H  . insulin aspart  5 Units Subcutaneous TID WC  . insulin glargine  19 Units Subcutaneous BID  . metoprolol  100 mg Oral Daily  . [START ON 03/13/2020] metoprolol tartrate  12.5 mg Oral Once  . perflutren lipid microspheres (DEFINITY) IV suspension      . sodium chloride flush  3 mL Intravenous Q12H  . sodium chloride flush  3 mL Intravenous Q12H  . sodium chloride flush  3 mL Intravenous Q12H  . spironolactone  12.5 mg Oral Daily  . tamsulosin  0.4 mg Oral Daily  . venlafaxine XR  112.5 mg Oral Q breakfast   Continuous Infusions: . sodium chloride    . heparin 1,500 Units/hr (03/11/20 2040)  . nitroGLYCERIN 20 mcg/min (03/11/20 0441)   PRN Meds: sodium chloride, acetaminophen, albuterol, morphine injection, nitroGLYCERIN, ondansetron (ZOFRAN) IV, perflutren lipid microspheres (DEFINITY) IV suspension, polyethylene glycol, senna-docusate, sodium chloride flush, temazepam   Vital Signs    Vitals:   03/11/20 1947 03/12/20 0036 03/12/20 0541 03/12/20 0818  BP: 134/72 110/65 127/84 (!) 141/77  Pulse: 70 71 72 72  Resp: 18 17 12    Temp: 97.6 F (36.4 C) 98.2 F (36.8 C) 97.6 F (36.4 C) 97.7 F (36.5 C)  TempSrc: Oral Oral Oral Oral  SpO2: 100% 99% 97% 99%  Weight:   129 kg   Height:        Intake/Output Summary (Last 24 hours) at 03/12/2020 0931 Last data filed at 03/12/2020 0631 Gross per 24 hour  Intake 1489 ml  Output  2980 ml  Net -1491 ml   Filed Weights   03/10/20 0441 03/11/20 0532 03/12/20 0541  Weight: 108.1 kg 129.7 kg 129 kg    Telemetry    NSR - Personally Reviewed  ECG    No new tracings - Personally Reviewed  Physical Exam   GEN: No acute distress.   Neck: No JVD, no carotid bruits Cardiac: RRR, no murmurs, rubs, or gallops.  Respiratory: Clear to auscultation bilaterally, no wheezes/ rales/ rhonchi, though speaking in short sentences GI: NABS, Soft, nontender, non-distended  MS: No edema; No deformity. Neuro:  Nonfocal, moving all extremities spontaneously Psych: Normal affect   Labs    Chemistry Recent Labs  Lab 03/05/20 1330 03/06/20 0412 03/10/20 0240 03/11/20 0457 03/12/20 0503  NA 131*   < > 136 134* 134*  K 4.5   < > 3.8 4.3 4.2  CL 94*   < > 96* 98 98  CO2 26   < > 23 24 24   GLUCOSE 311*   < > 232* 193* 208*  BUN 22*   < > 29* 29* 26*  CREATININE 0.88   < > 1.19 1.07 1.01  CALCIUM 9.2   < > 9.7 9.6 9.8  PROT 7.4  --   --   --   --  ALBUMIN 3.7  --   --   --   --   AST 36  --   --   --   --   ALT 21  --   --   --   --   ALKPHOS 113  --   --   --   --   BILITOT 1.1  --   --   --   --   GFRNONAA >60   < > >60 >60 >60  GFRAA >60   < > >60 >60 >60  ANIONGAP 11   < > 17* 12 12   < > = values in this interval not displayed.     Hematology Recent Labs  Lab 03/10/20 0240 03/11/20 0457 03/12/20 0503  WBC 13.0* 15.8* 15.7*  RBC 4.34 4.52 4.45  HGB 12.3* 13.0 12.7*  HCT 38.5* 40.4 39.2  MCV 88.7 89.4 88.1  MCH 28.3 28.8 28.5  MCHC 31.9 32.2 32.4  RDW 13.2 13.4 13.2  PLT 318 375 346    Cardiac EnzymesNo results for input(s): TROPONINI in the last 168 hours. No results for input(s): TROPIPOC in the last 168 hours.   BNP Recent Labs  Lab 03/05/20 1330  BNP 463.0*     DDimer  Recent Labs  Lab 03/05/20 1330  DDIMER 1.08*     Radiology    DG Chest 2 View  Result Date: 03/12/2020 CLINICAL DATA:  Extreme shortness of breath. Recent  diagnosis of non ST-elevation MI. EXAM: CHEST - 2 VIEW COMPARISON:  03/05/2020 FINDINGS: Normal heart size. Decreased lung volumes. Mild subsegmental atelectasis within the right lung base noted. Similar to previous exam. No pleural effusion or edema. IMPRESSION: Decreased lung volumes with persistent right base atelectasis. Electronically Signed   By: Kerby Moors M.D.   On: 03/12/2020 08:39    Cardiac Studies   Echocardiogram 03/06/20: 1. Left ventricular ejection fraction, by estimation, is 20 to 25%. The  left ventricle has severely decreased function. The left ventricle  demonstrates global hypokinesis. There is moderate left ventricular  hypertrophy. Left ventricular diastolic  parameters are consistent with Grade III diastolic dysfunction  (restrictive).  2. Right ventricular systolic function is low normal. The right  ventricular size is normal. Tricuspid regurgitation signal is inadequate  for assessing PA pressure.  3. Left atrial size was mildly dilated.  4. The mitral valve is grossly normal. Mild mitral valve regurgitation.  5. The aortic valve was not well visualized. Aortic valve regurgitation  is not visualized. Mild aortic valve sclerosis is present, with no  evidence of aortic valve stenosis.  6. IVC dilated but respirophasic change not well visualized.  R/LHC 03/07/20:  Prox RCA lesion is 99% stenosed.  Prox RCA to Mid RCA lesion is 40% stenosed.  Dist RCA lesion is 30% stenosed.  Prox LAD to Mid LAD lesion is 80% stenosed.  1st Diag lesion is 60% stenosed.  Mid LM to Dist LM lesion is 40% stenosed.  Prox Cx to Mid Cx lesion is 95% stenosed.   1. 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 Moderately elevated LV/right heart filling pressures c/w known diagnosis of heart failure 3. Known severe LV dysfunction by echo   Recommend: review revascularization options. Pt has surgical anatomy, but not sure he will be a candidate for CABG. High-risk multivessel PCI could be considered if he is not a candidate for surgery.  Patient Profile     59 y.o.malewith a history of hypertension, hyperlipidemia, type 2 diabetes mellitus, and depression who initially [facility] with shortness of breath and atypical chest pain. Ruled in for NSTEMI and found to have an EF of 20-25% on Echo.Cardiac catheterization reveals 99% right coronary artery, 80% proximal to mid LAD and 95% proximal circumflex. There is 40% distal left main.  Assessment & Plan    1. Severe multivessel CAD: patient presented from [facility] with chest pain, found to have an NSTEMI. HsTrop peaked at 4752 and trended down. Echo revealed EF 20-25% with G3DD. He underwent a [facility] 03/07/20 and was found to have severe multivessel CAD with subtotal occlusion of the pRCA, severe complex pLCx stenosis, and severe calcific stenosis of the pLAD. He was recommended for a TCTS consult for consideration of CABG vs high risk multivessel PCI. Dr. Orvan Seen evaluated today and is undergoing CABG work-up with plans for OR 03/13/20.  - Continue heparin and nitro gtt - Continue aspirin and statin - Continue metoprolol succinate - Anticipate CABG 03/13/20  2. Ischemic cardiomyopathy/acute combined CHF: EF 20-25% with G3DD on echo this admission. He has been diuresing with IV lasix, transitioned to po 03/11/20. UOP net -1.1L in the past 24 hours and -17L this admission. Weight down to 284 lbs today from 296 lbs on admission. He has a ?angioedema reaction to lisinopril in the past. Home amlodipine discontinued. Started on metoprolol succinate, spironolactone, and hydralazine this admission.  - Continue lasix 40mg  daily given good UOP response since transition to po. Low threshold for IV lasix if SOB progresses - Continue metoprolol succinate, spironolactone, and  hydralazine  3. HTN: BP generally stable - Managed in the context of #2  4. HLD: LDL 92 this admission; goal <70. Atorvastatin increased from 40mg  to 80mg  this admission - Continue atorvastatin  5. DM type 2: A1C 10.2 this admission.  - On ISS/lantus per primary - Anticipate addition of SGLT2-inhibitor prior to discharge  For questions or updates, please contact [place] Please consult [email or web address] for contact info under Cardiology/STEMI.      Signed, Abigail Butts, PA-C  03/12/2020, 9:31 AM   [phone]  I have examined the patient and reviewed assessment and plan and discussed with patient.  Agree with above as stated.  Plan is for CABG tomorrow.  All questions answered.  He is feeling better now.  He will need aggressive risk factor modification post bypass including weight loss, blood pressure, lipid and diabetes management.  Larae Grooms

## 2020-03-13 ENCOUNTER — Inpatient Hospital Stay (HOSPITAL_COMMUNITY): Payer: Medicaid Other | Admitting: Anesthesiology

## 2020-03-13 ENCOUNTER — Inpatient Hospital Stay (HOSPITAL_COMMUNITY): Payer: Medicaid Other

## 2020-03-13 ENCOUNTER — Encounter (HOSPITAL_COMMUNITY)
Admission: EM | Disposition: A | Discharge status: Skilled Nursing Facility | Payer: Self-pay | Source: Home / Self Care | Attending: Cardiothoracic Surgery

## 2020-03-13 DIAGNOSIS — Z951 Presence of aortocoronary bypass graft: Secondary | ICD-10-CM

## 2020-03-13 DIAGNOSIS — I25110 Atherosclerotic heart disease of native coronary artery with unstable angina pectoris: Secondary | ICD-10-CM

## 2020-03-13 HISTORY — PX: TEE WITHOUT CARDIOVERSION: SHX5443

## 2020-03-13 HISTORY — PX: RADIAL ARTERY HARVEST: SHX5067

## 2020-03-13 HISTORY — PX: CORONARY ARTERY BYPASS GRAFT: SHX141

## 2020-03-13 LAB — COMPREHENSIVE METABOLIC PANEL
ALT: 30 U/L (ref 0–44)
AST: 29 U/L (ref 15–41)
Albumin: 3.2 g/dL — ABNORMAL LOW (ref 3.5–5.0)
Alkaline Phosphatase: 90 U/L (ref 38–126)
Anion gap: 11 (ref 5–15)
BUN: 26 mg/dL — ABNORMAL HIGH (ref 6–20)
CO2: 27 mmol/L (ref 22–32)
Calcium: 9.4 mg/dL (ref 8.9–10.3)
Chloride: 98 mmol/L (ref 98–111)
Creatinine, Ser: 1.1 mg/dL (ref 0.61–1.24)
GFR calc Af Amer: >60 mL/min (ref >60)
GFR calc non Af Amer: >60 mL/min (ref >60)
Glucose, Bld: 229 mg/dL — ABNORMAL HIGH (ref 70–99)
Potassium: 3.9 mmol/L (ref 3.5–5.1)
Sodium: 136 mmol/L (ref 135–145)
Total Bilirubin: 0.5 mg/dL (ref 0.3–1.2)
Total Protein: 6.7 g/dL (ref 6.5–8.1)

## 2020-03-13 LAB — POCT I-STAT 7, (LYTES, BLD GAS, ICA,H+H)
Acid-Base Excess: 3 mmol/L — ABNORMAL HIGH (ref 0.0–2.0)
Acid-Base Excess: 3 mmol/L — ABNORMAL HIGH (ref 0.0–2.0)
Acid-base deficit: 1 mmol/L (ref 0.0–2.0)
Acid-base deficit: 1 mmol/L (ref 0.0–2.0)
Acid-base deficit: 2 mmol/L (ref 0.0–2.0)
Acid-base deficit: 2 mmol/L (ref 0.0–2.0)
Acid-base deficit: 3 mmol/L — ABNORMAL HIGH (ref 0.0–2.0)
Acid-base deficit: 3 mmol/L — ABNORMAL HIGH (ref 0.0–2.0)
Bicarbonate: 26.2 mmol/L (ref 20.0–28.0)
Bicarbonate: 29.1 mmol/L — ABNORMAL HIGH (ref 20.0–28.0)
Bicarbonate: 27.6 mmol/L (ref 20.0–28.0)
Bicarbonate: 24.5 mmol/L (ref 20.0–28.0)
Bicarbonate: 24 mmol/L (ref 20.0–28.0)
Bicarbonate: 23.6 mmol/L (ref 20.0–28.0)
Bicarbonate: 22.3 mmol/L (ref 20.0–28.0)
Bicarbonate: 22.8 mmol/L (ref 20.0–28.0)
Calcium, Ion: 1.16 mmol/L (ref 1.15–1.40)
Calcium, Ion: 1.17 mmol/L (ref 1.15–1.40)
Calcium, Ion: 1.19 mmol/L (ref 1.15–1.40)
Calcium, Ion: 1.2 mmol/L (ref 1.15–1.40)
Calcium, Ion: 1.23 mmol/L (ref 1.15–1.40)
Calcium, Ion: 1.23 mmol/L (ref 1.15–1.40)
Calcium, Ion: 1.22 mmol/L (ref 1.15–1.40)
Calcium, Ion: 1.23 mmol/L (ref 1.15–1.40)
HCT: 37 % — ABNORMAL LOW (ref 39.0–52.0)
HCT: 28 % — ABNORMAL LOW (ref 39.0–52.0)
HCT: 30 % — ABNORMAL LOW (ref 39.0–52.0)
HCT: 28 % — ABNORMAL LOW (ref 39.0–52.0)
HCT: 33 % — ABNORMAL LOW (ref 39.0–52.0)
HCT: 32 % — ABNORMAL LOW (ref 39.0–52.0)
HCT: 34 % — ABNORMAL LOW (ref 39.0–52.0)
HCT: 34 % — ABNORMAL LOW (ref 39.0–52.0)
Hemoglobin: 12.6 g/dL — ABNORMAL LOW (ref 13.0–17.0)
Hemoglobin: 9.5 g/dL — ABNORMAL LOW (ref 13.0–17.0)
Hemoglobin: 10.2 g/dL — ABNORMAL LOW (ref 13.0–17.0)
Hemoglobin: 9.5 g/dL — ABNORMAL LOW (ref 13.0–17.0)
Hemoglobin: 11.2 g/dL — ABNORMAL LOW (ref 13.0–17.0)
Hemoglobin: 10.9 g/dL — ABNORMAL LOW (ref 13.0–17.0)
Hemoglobin: 11.6 g/dL — ABNORMAL LOW (ref 13.0–17.0)
Hemoglobin: 11.6 g/dL — ABNORMAL LOW (ref 13.0–17.0)
O2 Saturation: 99 %
O2 Saturation: 100 %
O2 Saturation: 100 %
O2 Saturation: 86 %
O2 Saturation: 90 %
O2 Saturation: 97 %
O2 Saturation: 96 %
O2 Saturation: 93 %
Patient temperature: 36.4
Patient temperature: 36.5
Patient temperature: 36.4
Patient temperature: 36.7
Potassium: 4.9 mmol/L (ref 3.5–5.1)
Potassium: 4 mmol/L (ref 3.5–5.1)
Potassium: 4.5 mmol/L (ref 3.5–5.1)
Potassium: 3.6 mmol/L (ref 3.5–5.1)
Potassium: 3.9 mmol/L (ref 3.5–5.1)
Potassium: 4.1 mmol/L (ref 3.5–5.1)
Potassium: 4.7 mmol/L (ref 3.5–5.1)
Potassium: 4.6 mmol/L (ref 3.5–5.1)
Sodium: 137 mmol/L (ref 135–145)
Sodium: 138 mmol/L (ref 135–145)
Sodium: 138 mmol/L (ref 135–145)
Sodium: 140 mmol/L (ref 135–145)
Sodium: 140 mmol/L (ref 135–145)
Sodium: 138 mmol/L (ref 135–145)
Sodium: 139 mmol/L (ref 135–145)
Sodium: 138 mmol/L (ref 135–145)
TCO2: 28 mmol/L (ref 22–32)
TCO2: 31 mmol/L (ref 22–32)
TCO2: 29 mmol/L (ref 22–32)
TCO2: 26 mmol/L (ref 22–32)
TCO2: 25 mmol/L (ref 22–32)
TCO2: 25 mmol/L (ref 22–32)
TCO2: 23 mmol/L (ref 22–32)
TCO2: 24 mmol/L (ref 22–32)
pCO2 arterial: 53.7 mmHg — ABNORMAL HIGH (ref 32.0–48.0)
pCO2 arterial: 53.2 mmHg — ABNORMAL HIGH (ref 32.0–48.0)
pCO2 arterial: 39.8 mmHg (ref 32.0–48.0)
pCO2 arterial: 43.5 mmHg (ref 32.0–48.0)
pCO2 arterial: 45.2 mmHg (ref 32.0–48.0)
pCO2 arterial: 39.9 mmHg (ref 32.0–48.0)
pCO2 arterial: 38 mmHg (ref 32.0–48.0)
pCO2 arterial: 40.6 mmHg (ref 32.0–48.0)
pH, Arterial: 7.296 — ABNORMAL LOW (ref 7.350–7.450)
pH, Arterial: 7.347 — ABNORMAL LOW (ref 7.350–7.450)
pH, Arterial: 7.449 (ref 7.350–7.450)
pH, Arterial: 7.358 (ref 7.350–7.450)
pH, Arterial: 7.331 — ABNORMAL LOW (ref 7.350–7.450)
pH, Arterial: 7.377 (ref 7.350–7.450)
pH, Arterial: 7.373 (ref 7.350–7.450)
pH, Arterial: 7.356 (ref 7.350–7.450)
pO2, Arterial: 170 mmHg — ABNORMAL HIGH (ref 83.0–108.0)
pO2, Arterial: 291 mmHg — ABNORMAL HIGH (ref 83.0–108.0)
pO2, Arterial: 292 mmHg — ABNORMAL HIGH (ref 83.0–108.0)
pO2, Arterial: 54 mmHg — ABNORMAL LOW (ref 83.0–108.0)
pO2, Arterial: 61 mmHg — ABNORMAL LOW (ref 83.0–108.0)
pO2, Arterial: 92 mmHg (ref 83.0–108.0)
pO2, Arterial: 79 mmHg — ABNORMAL LOW (ref 83.0–108.0)
pO2, Arterial: 70 mmHg — ABNORMAL LOW (ref 83.0–108.0)

## 2020-03-13 LAB — CBC
HCT: 37.5 % — ABNORMAL LOW (ref 39.0–52.0)
HCT: 34.1 % — ABNORMAL LOW (ref 39.0–52.0)
HCT: 36.3 % — ABNORMAL LOW (ref 39.0–52.0)
HCT: 35.5 % — ABNORMAL LOW (ref 39.0–52.0)
Hemoglobin: 12 g/dL — ABNORMAL LOW (ref 13.0–17.0)
Hemoglobin: 10.8 g/dL — ABNORMAL LOW (ref 13.0–17.0)
Hemoglobin: 11.4 g/dL — ABNORMAL LOW (ref 13.0–17.0)
Hemoglobin: 11.1 g/dL — ABNORMAL LOW (ref 13.0–17.0)
MCH: 28.9 pg (ref 26.0–34.0)
MCH: 29.1 pg (ref 26.0–34.0)
MCH: 28.7 pg (ref 26.0–34.0)
MCH: 28.5 pg (ref 26.0–34.0)
MCHC: 32 g/dL (ref 30.0–36.0)
MCHC: 31.7 g/dL (ref 30.0–36.0)
MCHC: 31.4 g/dL (ref 30.0–36.0)
MCHC: 31.3 g/dL (ref 30.0–36.0)
MCV: 90.4 fL (ref 80.0–100.0)
MCV: 91.9 fL (ref 80.0–100.0)
MCV: 91.4 fL (ref 80.0–100.0)
MCV: 91.3 fL (ref 80.0–100.0)
Platelets: 315 10*3/uL (ref 150–400)
Platelets: 266 10*3/uL (ref 150–400)
Platelets: 298 10*3/uL (ref 150–400)
Platelets: 280 10*3/uL (ref 150–400)
RBC: 4.15 MIL/uL — ABNORMAL LOW (ref 4.22–5.81)
RBC: 3.71 MIL/uL — ABNORMAL LOW (ref 4.22–5.81)
RBC: 3.97 MIL/uL — ABNORMAL LOW (ref 4.22–5.81)
RBC: 3.89 MIL/uL — ABNORMAL LOW (ref 4.22–5.81)
RDW: 13.4 % (ref 11.5–15.5)
RDW: 13.3 % (ref 11.5–15.5)
RDW: 13.4 % (ref 11.5–15.5)
RDW: 13.5 % (ref 11.5–15.5)
WBC: 13.4 10*3/uL — ABNORMAL HIGH (ref 4.0–10.5)
WBC: 23 10*3/uL — ABNORMAL HIGH (ref 4.0–10.5)
WBC: 25.6 10*3/uL — ABNORMAL HIGH (ref 4.0–10.5)
WBC: 22.1 10*3/uL — ABNORMAL HIGH (ref 4.0–10.5)
nRBC: 0 % (ref 0.0–0.2)
nRBC: 0 % (ref 0.0–0.2)
nRBC: 0 % (ref 0.0–0.2)
nRBC: 0 % (ref 0.0–0.2)

## 2020-03-13 LAB — POCT I-STAT EG7
Acid-Base Excess: 0 mmol/L (ref 0.0–2.0)
Bicarbonate: 24.9 mmol/L (ref 20.0–28.0)
Calcium, Ion: 1.21 mmol/L (ref 1.15–1.40)
HCT: 32 % — ABNORMAL LOW (ref 39.0–52.0)
Hemoglobin: 10.9 g/dL — ABNORMAL LOW (ref 13.0–17.0)
O2 Saturation: 83 %
Potassium: 4.2 mmol/L (ref 3.5–5.1)
Sodium: 138 mmol/L (ref 135–145)
TCO2: 26 mmol/L (ref 22–32)
pCO2, Ven: 40.5 mmHg — ABNORMAL LOW (ref 44.0–60.0)
pH, Ven: 7.397 (ref 7.250–7.430)
pO2, Ven: 48 mmHg — ABNORMAL HIGH (ref 32.0–45.0)

## 2020-03-13 LAB — GLUCOSE, CAPILLARY
Glucose-Capillary: 205 mg/dL — ABNORMAL HIGH (ref 70–99)
Glucose-Capillary: 168 mg/dL — ABNORMAL HIGH (ref 70–99)
Glucose-Capillary: 165 mg/dL — ABNORMAL HIGH (ref 70–99)
Glucose-Capillary: 169 mg/dL — ABNORMAL HIGH (ref 70–99)
Glucose-Capillary: 144 mg/dL — ABNORMAL HIGH (ref 70–99)
Glucose-Capillary: 135 mg/dL — ABNORMAL HIGH (ref 70–99)
Glucose-Capillary: 127 mg/dL — ABNORMAL HIGH (ref 70–99)
Glucose-Capillary: 134 mg/dL — ABNORMAL HIGH (ref 70–99)
Glucose-Capillary: 145 mg/dL — ABNORMAL HIGH (ref 70–99)
Glucose-Capillary: 150 mg/dL — ABNORMAL HIGH (ref 70–99)

## 2020-03-13 LAB — BASIC METABOLIC PANEL
Anion gap: 10 (ref 5–15)
BUN: 19 mg/dL (ref 6–20)
CO2: 22 mmol/L (ref 22–32)
Calcium: 8.7 mg/dL — ABNORMAL LOW (ref 8.9–10.3)
Chloride: 105 mmol/L (ref 98–111)
Creatinine, Ser: 0.93 mg/dL (ref 0.61–1.24)
GFR calc Af Amer: >60 mL/min (ref >60)
GFR calc non Af Amer: >60 mL/min (ref >60)
Glucose, Bld: 138 mg/dL — ABNORMAL HIGH (ref 70–99)
Potassium: 5 mmol/L (ref 3.5–5.1)
Sodium: 137 mmol/L (ref 135–145)

## 2020-03-13 LAB — POCT I-STAT, CHEM 8
BUN: 25 mg/dL — ABNORMAL HIGH (ref 6–20)
BUN: 21 mg/dL — ABNORMAL HIGH (ref 6–20)
BUN: 22 mg/dL — ABNORMAL HIGH (ref 6–20)
BUN: 22 mg/dL — ABNORMAL HIGH (ref 6–20)
BUN: 21 mg/dL — ABNORMAL HIGH (ref 6–20)
Calcium, Ion: 1.19 mmol/L (ref 1.15–1.40)
Calcium, Ion: 1.25 mmol/L (ref 1.15–1.40)
Calcium, Ion: 1.18 mmol/L (ref 1.15–1.40)
Calcium, Ion: 1.19 mmol/L (ref 1.15–1.40)
Calcium, Ion: 1.18 mmol/L (ref 1.15–1.40)
Chloride: 98 mmol/L (ref 98–111)
Chloride: 103 mmol/L (ref 98–111)
Chloride: 100 mmol/L (ref 98–111)
Chloride: 101 mmol/L (ref 98–111)
Chloride: 101 mmol/L (ref 98–111)
Creatinine, Ser: 1 mg/dL (ref 0.61–1.24)
Creatinine, Ser: 0.8 mg/dL (ref 0.61–1.24)
Creatinine, Ser: 0.8 mg/dL (ref 0.61–1.24)
Creatinine, Ser: 0.8 mg/dL (ref 0.61–1.24)
Creatinine, Ser: 0.8 mg/dL (ref 0.61–1.24)
Glucose, Bld: 222 mg/dL — ABNORMAL HIGH (ref 70–99)
Glucose, Bld: 197 mg/dL — ABNORMAL HIGH (ref 70–99)
Glucose, Bld: 176 mg/dL — ABNORMAL HIGH (ref 70–99)
Glucose, Bld: 171 mg/dL — ABNORMAL HIGH (ref 70–99)
Glucose, Bld: 173 mg/dL — ABNORMAL HIGH (ref 70–99)
HCT: 40 % (ref 39.0–52.0)
HCT: 30 % — ABNORMAL LOW (ref 39.0–52.0)
HCT: 30 % — ABNORMAL LOW (ref 39.0–52.0)
HCT: 30 % — ABNORMAL LOW (ref 39.0–52.0)
HCT: 40 % (ref 39.0–52.0)
Hemoglobin: 13.6 g/dL (ref 13.0–17.0)
Hemoglobin: 10.2 g/dL — ABNORMAL LOW (ref 13.0–17.0)
Hemoglobin: 10.2 g/dL — ABNORMAL LOW (ref 13.0–17.0)
Hemoglobin: 10.2 g/dL — ABNORMAL LOW (ref 13.0–17.0)
Hemoglobin: 13.6 g/dL (ref 13.0–17.0)
Potassium: 5 mmol/L (ref 3.5–5.1)
Potassium: 3.5 mmol/L (ref 3.5–5.1)
Potassium: 4.2 mmol/L (ref 3.5–5.1)
Potassium: 4.5 mmol/L (ref 3.5–5.1)
Potassium: 3.6 mmol/L (ref 3.5–5.1)
Sodium: 137 mmol/L (ref 135–145)
Sodium: 139 mmol/L (ref 135–145)
Sodium: 138 mmol/L (ref 135–145)
Sodium: 138 mmol/L (ref 135–145)
Sodium: 140 mmol/L (ref 135–145)
TCO2: 25 mmol/L (ref 22–32)
TCO2: 24 mmol/L (ref 22–32)
TCO2: 27 mmol/L (ref 22–32)
TCO2: 28 mmol/L (ref 22–32)
TCO2: 25 mmol/L (ref 22–32)

## 2020-03-13 LAB — PROTIME-INR
INR: 1.3 — ABNORMAL HIGH (ref 0.8–1.2)
Prothrombin Time: 15.4 seconds — ABNORMAL HIGH (ref 11.4–15.2)

## 2020-03-13 LAB — ECHO INTRAOPERATIVE TEE
Height: 69 in
Weight: 4568 oz

## 2020-03-13 LAB — APTT: aPTT: 31 seconds (ref 24–36)

## 2020-03-13 LAB — MAGNESIUM
Magnesium: 1.8 mg/dL (ref 1.7–2.4)
Magnesium: 2.5 mg/dL — ABNORMAL HIGH (ref 1.7–2.4)
Magnesium: 2.3 mg/dL (ref 1.7–2.4)

## 2020-03-13 LAB — PLATELET COUNT: Platelets: 249 10*3/uL (ref 150–400)

## 2020-03-13 LAB — SURGICAL PCR SCREEN
MRSA, PCR: NEGATIVE
Staphylococcus aureus: POSITIVE — AB

## 2020-03-13 LAB — HEMOGLOBIN AND HEMATOCRIT, BLOOD
HCT: 29.5 % — ABNORMAL LOW (ref 39.0–52.0)
Hemoglobin: 9.5 g/dL — ABNORMAL LOW (ref 13.0–17.0)

## 2020-03-13 LAB — HEPARIN LEVEL (UNFRACTIONATED): Heparin Unfractionated: 0.35 IU/mL (ref 0.30–0.70)

## 2020-03-13 IMAGING — DX DG CHEST 1V PORT
1 series · 1 of 1 positions shown · non-contrast
Comparison: [DATE].

CLINICAL DATA: Coronary artery bypass grafting.  [DATE].

EXAM:
PORTABLE CHEST 1 VIEW

[chest]
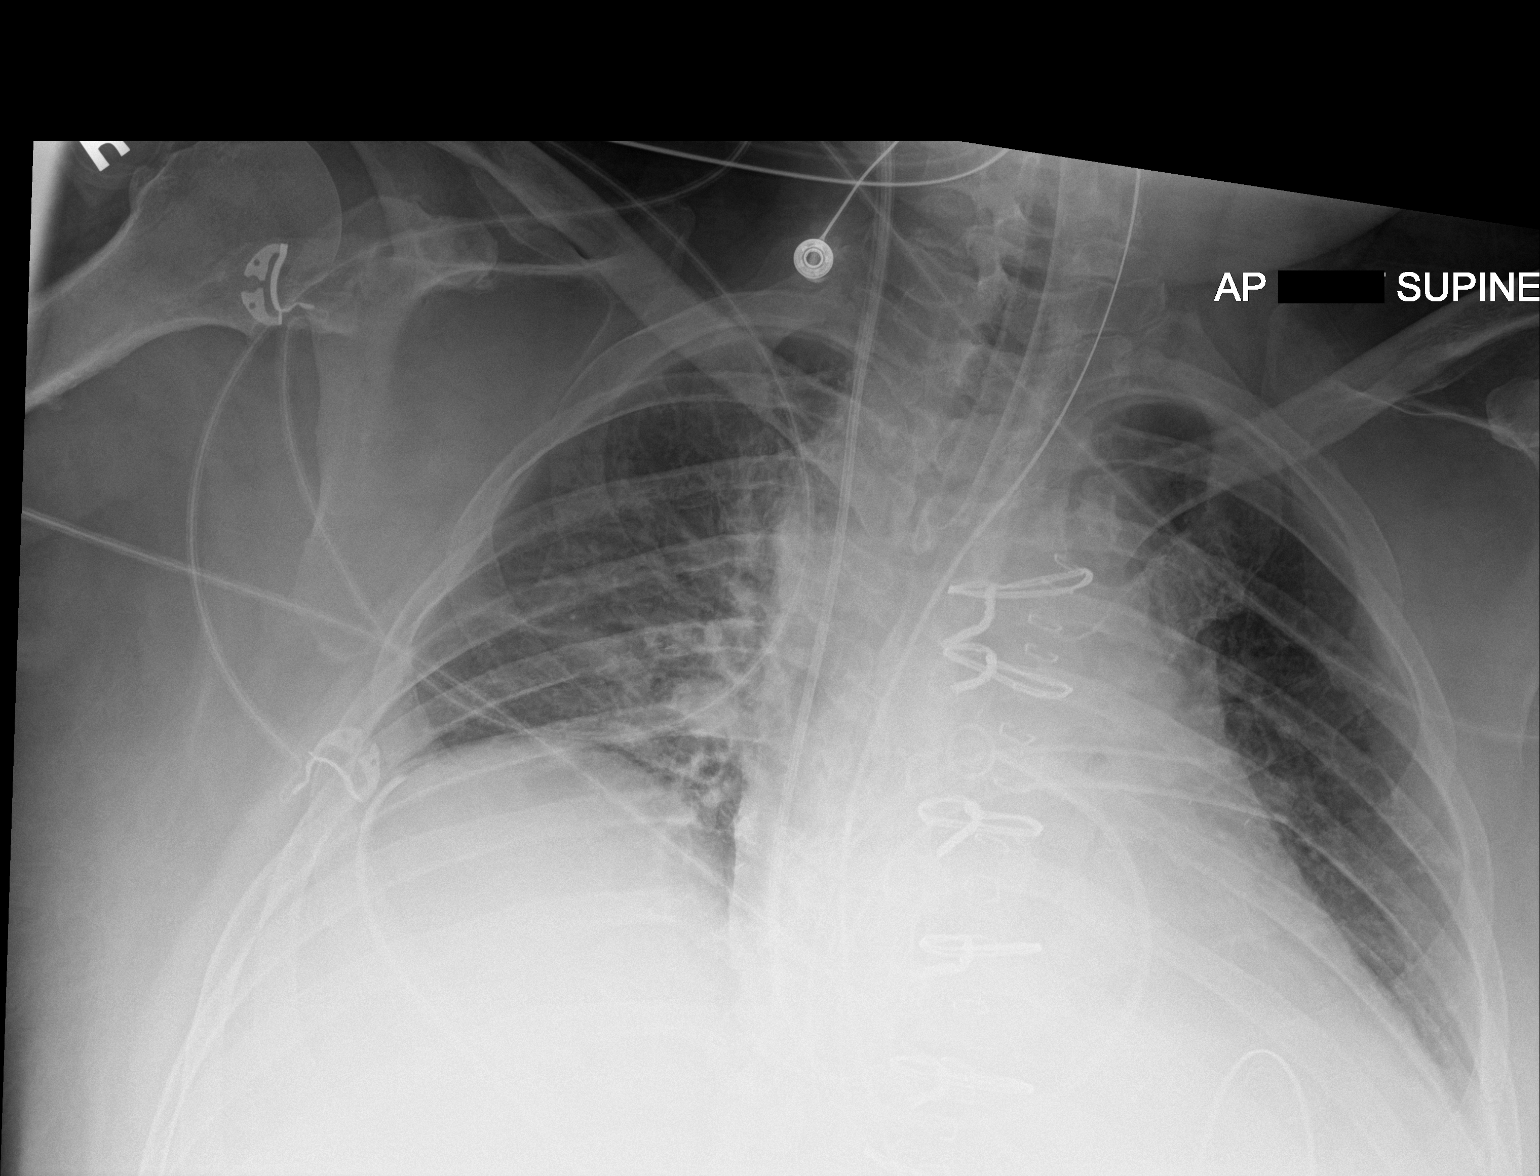

[1 of 1 positions shown; findings below may reference images not displayed]

FINDINGS: Prior CABG. Cardiomegaly. Endotracheal tube tip 1.5 cm above the
lower portion of the carina. Proximal repositioning of approximately
2 cm should be considered. NG tube noted with tip below left
hemidiaphragm. Swan-Ganz catheter noted with tip over the pulmonary
outflow tract. Mediastinal drainage catheters and bilateral chest
tubes noted. Mild bilateral interstitial prominence. CHF cannot be
excluded. Low lung volumes. No pneumothorax.
IMPRESSION: 1. Lines and tubes as above. Endotracheal tube tip 1.5 cm above the
carina, proximal repositioning of approximately 2 cm should be
considered. No pneumothorax.

2. Prior CABG. Mild bilateral interstitial prominence. CHF cannot be
excluded.

3.  Low lung volumes.

## 2020-03-13 SURGERY — CORONARY ARTERY BYPASS GRAFTING (CABG)
Anesthesia: General | Site: Chest

## 2020-03-13 MED ORDER — ALBUMIN HUMAN 5 % IV SOLN: INTRAVENOUS | Status: DC | PRN

## 2020-03-13 MED ORDER — ACETAMINOPHEN 160 MG/5ML PO SOLN: 1000.0000 mg | Freq: Four times a day (QID) | ORAL | Status: DC

## 2020-03-13 MED ORDER — ACETAMINOPHEN 500 MG PO TABS
1000.0000 mg | ORAL_TABLET | Freq: Four times a day (QID) | ORAL | Status: DC
  Administered 2020-03-14 – 2020-03-15 (×8): 1000 mg via ORAL
  Filled 2020-03-13 (×8): qty 2

## 2020-03-13 MED ORDER — ORAL CARE MOUTH RINSE
15.0000 mL | OROMUCOSAL | Status: DC
  Administered 2020-03-13 (×2): 15 mL via OROMUCOSAL

## 2020-03-13 MED ORDER — PLASMA-LYTE 148 IV SOLN
INTRAVENOUS | Status: DC | PRN
  Administered 2020-03-13: 500 mL via INTRAVASCULAR

## 2020-03-13 MED ORDER — INSULIN REGULAR(HUMAN) IN NACL 100-0.9 UT/100ML-% IV SOLN
INTRAVENOUS | Status: DC
  Administered 2020-03-13: 3 [IU]/h via INTRAVENOUS
  Filled 2020-03-13: qty 100

## 2020-03-13 MED ORDER — CHLORHEXIDINE GLUCONATE 0.12% ORAL RINSE (MEDLINE KIT)
15.0000 mL | Freq: Two times a day (BID) | OROMUCOSAL | Status: DC
  Administered 2020-03-13 – 2020-03-19 (×8): 15 mL via OROMUCOSAL

## 2020-03-13 MED ORDER — PROPOFOL 10 MG/ML IV BOLUS
INTRAVENOUS | Status: DC | PRN
  Administered 2020-03-13: 30 mg via INTRAVENOUS

## 2020-03-13 MED ORDER — PHENYLEPHRINE HCL-NACL 20-0.9 MG/250ML-% IV SOLN: 0.0000 ug/min | INTRAVENOUS | Status: DC

## 2020-03-13 MED ORDER — METOPROLOL TARTRATE 5 MG/5ML IV SOLN: 2.5000 mg | INTRAVENOUS | Status: DC | PRN

## 2020-03-13 MED ORDER — SODIUM CHLORIDE 0.9% FLUSH
3.0000 mL | Freq: Two times a day (BID) | INTRAVENOUS | Status: DC
  Administered 2020-03-14 – 2020-03-19 (×10): 3 mL via INTRAVENOUS

## 2020-03-13 MED ORDER — MIDAZOLAM HCL 2 MG/2ML IJ SOLN: 2.0000 mg | INTRAMUSCULAR | Status: DC | PRN

## 2020-03-13 MED ORDER — PROPOFOL 10 MG/ML IV BOLUS
INTRAVENOUS | Status: AC
  Filled 2020-03-13: qty 40

## 2020-03-13 MED ORDER — DEXMEDETOMIDINE HCL IN NACL 400 MCG/100ML IV SOLN
0.0000 ug/kg/h | INTRAVENOUS | Status: DC
  Administered 2020-03-13: 0.7 ug/kg/h via INTRAVENOUS
  Filled 2020-03-13: qty 100

## 2020-03-13 MED ORDER — HEPARIN SODIUM (PORCINE) 1000 UNIT/ML IJ SOLN
INTRAMUSCULAR | Status: DC | PRN
  Administered 2020-03-13: 5000 [IU] via INTRAVENOUS
  Administered 2020-03-13: 48000 [IU] via INTRAVENOUS

## 2020-03-13 MED ORDER — METOPROLOL TARTRATE 25 MG/10 ML ORAL SUSPENSION: 12.5000 mg | Freq: Two times a day (BID) | ORAL | Status: DC

## 2020-03-13 MED ORDER — EPINEPHRINE HCL 5 MG/250ML IV SOLN IN NS
2.0000 ug/min | INTRAVENOUS | Status: DC
  Administered 2020-03-13: 2 ug/min via INTRAVENOUS

## 2020-03-13 MED ORDER — SODIUM CHLORIDE (PF) 0.9 % IJ SOLN
OROMUCOSAL | Status: DC | PRN
  Administered 2020-03-13: 4 mL via TOPICAL

## 2020-03-13 MED ORDER — STERILE WATER FOR INJECTION IV SOLN
INTRAVENOUS | Status: DC | PRN
  Administered 2020-03-13: 30 mL

## 2020-03-13 MED ORDER — ASPIRIN 81 MG PO CHEW: 324.0000 mg | CHEWABLE_TABLET | Freq: Every day | ORAL | Status: DC

## 2020-03-13 MED ORDER — PANTOPRAZOLE SODIUM 40 MG PO TBEC
40.0000 mg | DELAYED_RELEASE_TABLET | Freq: Every day | ORAL | Status: DC
  Administered 2020-03-15 – 2020-03-19 (×5): 40 mg via ORAL
  Filled 2020-03-13 (×5): qty 1

## 2020-03-13 MED ORDER — SODIUM CHLORIDE 0.9 % IV SOLN: 250.0000 mL | INTRAVENOUS | Status: DC

## 2020-03-13 MED ORDER — HEMOSTATIC AGENTS (NO CHARGE) OPTIME
TOPICAL | Status: DC | PRN
  Administered 2020-03-13: 1 via TOPICAL

## 2020-03-13 MED ORDER — LACTATED RINGERS IV SOLN: INTRAVENOUS | Status: DC | PRN

## 2020-03-13 MED ORDER — CHLORHEXIDINE GLUCONATE 0.12 % MT SOLN
15.0000 mL | OROMUCOSAL | Status: AC
  Administered 2020-03-13: 15 mL via OROMUCOSAL
  Filled 2020-03-13: qty 15

## 2020-03-13 MED ORDER — DOCUSATE SODIUM 100 MG PO CAPS
200.0000 mg | ORAL_CAPSULE | Freq: Every day | ORAL | Status: DC
  Administered 2020-03-14 – 2020-03-18 (×5): 200 mg via ORAL
  Filled 2020-03-13 (×6): qty 2

## 2020-03-13 MED ORDER — METOPROLOL TARTRATE 12.5 MG HALF TABLET
12.5000 mg | ORAL_TABLET | Freq: Two times a day (BID) | ORAL | Status: DC
  Administered 2020-03-13 – 2020-03-15 (×4): 12.5 mg via ORAL
  Filled 2020-03-13 (×4): qty 1

## 2020-03-13 MED ORDER — SODIUM CHLORIDE (PF) 0.9 % IJ SOLN
INTRAMUSCULAR | Status: AC
  Filled 2020-03-13: qty 10

## 2020-03-13 MED ORDER — POTASSIUM CHLORIDE 10 MEQ/50ML IV SOLN
10.0000 meq | INTRAVENOUS | Status: AC
  Administered 2020-03-13 (×3): 10 meq via INTRAVENOUS

## 2020-03-13 MED ORDER — SODIUM CHLORIDE 0.9 % IV SOLN: INTRAVENOUS | Status: DC

## 2020-03-13 MED ORDER — ALBUTEROL SULFATE HFA 108 (90 BASE) MCG/ACT IN AERS
INHALATION_SPRAY | RESPIRATORY_TRACT | Status: AC
  Filled 2020-03-13: qty 6.7

## 2020-03-13 MED ORDER — CHLORHEXIDINE GLUCONATE 0.12 % MT SOLN
OROMUCOSAL | Status: AC
  Filled 2020-03-13: qty 15

## 2020-03-13 MED ORDER — EPHEDRINE SULFATE-NACL 50-0.9 MG/10ML-% IV SOSY
PREFILLED_SYRINGE | INTRAVENOUS | Status: DC | PRN
  Administered 2020-03-13: 10 mg via INTRAVENOUS

## 2020-03-13 MED ORDER — LACTATED RINGERS IV SOLN: INTRAVENOUS | Status: DC

## 2020-03-13 MED ORDER — DEXTROSE 50 % IV SOLN: 0.0000 mL | INTRAVENOUS | Status: DC | PRN

## 2020-03-13 MED ORDER — TRANEXAMIC ACID 1000 MG/10ML IV SOLN
1.5000 mg/kg/h | INTRAVENOUS | Status: DC
  Filled 2020-03-13: qty 25

## 2020-03-13 MED ORDER — HEPARIN SODIUM (PORCINE) 1000 UNIT/ML IJ SOLN
INTRAMUSCULAR | Status: AC
  Filled 2020-03-13: qty 2

## 2020-03-13 MED ORDER — ONDANSETRON HCL 4 MG/2ML IJ SOLN
4.0000 mg | Freq: Four times a day (QID) | INTRAMUSCULAR | Status: DC | PRN
  Administered 2020-03-13 – 2020-03-17 (×10): 4 mg via INTRAVENOUS
  Filled 2020-03-13 (×10): qty 2

## 2020-03-13 MED ORDER — SUCCINYLCHOLINE CHLORIDE 200 MG/10ML IV SOSY
PREFILLED_SYRINGE | INTRAVENOUS | Status: AC
  Filled 2020-03-13: qty 10

## 2020-03-13 MED ORDER — VANCOMYCIN HCL IN DEXTROSE 1-5 GM/200ML-% IV SOLN
1000.0000 mg | Freq: Once | INTRAVENOUS | Status: AC
  Administered 2020-03-13: 1000 mg via INTRAVENOUS
  Filled 2020-03-13: qty 200

## 2020-03-13 MED ORDER — NON FORMULARY: Status: DC | PRN

## 2020-03-13 MED ORDER — FENTANYL CITRATE (PF) 250 MCG/5ML IJ SOLN
INTRAMUSCULAR | Status: DC | PRN
  Administered 2020-03-13: 200 ug via INTRAVENOUS
  Administered 2020-03-13: 50 ug via INTRAVENOUS
  Administered 2020-03-13: 100 ug via INTRAVENOUS
  Administered 2020-03-13: 450 ug via INTRAVENOUS
  Administered 2020-03-13: 200 ug via INTRAVENOUS

## 2020-03-13 MED ORDER — ROCURONIUM BROMIDE 10 MG/ML (PF) SYRINGE
PREFILLED_SYRINGE | INTRAVENOUS | Status: AC
  Filled 2020-03-13: qty 10

## 2020-03-13 MED ORDER — CHLORHEXIDINE GLUCONATE CLOTH 2 % EX PADS
6.0000 | MEDICATED_PAD | Freq: Every day | CUTANEOUS | Status: DC
  Administered 2020-03-13 – 2020-03-19 (×6): 6 via TOPICAL

## 2020-03-13 MED ORDER — NITROGLYCERIN IN D5W 200-5 MCG/ML-% IV SOLN: 0.0000 ug/min | INTRAVENOUS | Status: DC

## 2020-03-13 MED ORDER — ROCURONIUM BROMIDE 10 MG/ML (PF) SYRINGE
PREFILLED_SYRINGE | INTRAVENOUS | Status: AC
  Filled 2020-03-13: qty 20

## 2020-03-13 MED ORDER — ASPIRIN EC 325 MG PO TBEC: 325.0000 mg | DELAYED_RELEASE_TABLET | Freq: Every day | ORAL | Status: DC

## 2020-03-13 MED ORDER — ACETAMINOPHEN 160 MG/5ML PO SOLN: 650.0000 mg | Freq: Once | ORAL | Status: AC

## 2020-03-13 MED ORDER — PROTAMINE SULFATE 10 MG/ML IV SOLN
INTRAVENOUS | Status: DC | PRN
  Administered 2020-03-13: 10 mg via INTRAVENOUS
  Administered 2020-03-13: 340 mg via INTRAVENOUS

## 2020-03-13 MED ORDER — LEVOFLOXACIN IN D5W 500 MG/100ML IV SOLN
500.0000 mg | Freq: Once | INTRAVENOUS | Status: AC
  Administered 2020-03-14: 500 mg via INTRAVENOUS
  Filled 2020-03-13: qty 100

## 2020-03-13 MED ORDER — ROCURONIUM BROMIDE 10 MG/ML (PF) SYRINGE
PREFILLED_SYRINGE | INTRAVENOUS | Status: DC | PRN
  Administered 2020-03-13: 50 mg via INTRAVENOUS
  Administered 2020-03-13: 100 mg via INTRAVENOUS
  Administered 2020-03-13 (×2): 50 mg via INTRAVENOUS

## 2020-03-13 MED ORDER — BUPIVACAINE HCL (PF) 0.5 % IJ SOLN
INTRAMUSCULAR | Status: AC
  Filled 2020-03-13: qty 30

## 2020-03-13 MED ORDER — MIDAZOLAM HCL (PF) 10 MG/2ML IJ SOLN
INTRAMUSCULAR | Status: AC
  Filled 2020-03-13: qty 2

## 2020-03-13 MED ORDER — SUCCINYLCHOLINE CHLORIDE 20 MG/ML IJ SOLN
INTRAMUSCULAR | Status: DC | PRN
  Administered 2020-03-13: 160 mg via INTRAVENOUS

## 2020-03-13 MED ORDER — PHENYLEPHRINE HCL-NACL 20-0.9 MG/250ML-% IV SOLN
INTRAVENOUS | Status: DC | PRN
  Administered 2020-03-13: 10 ug/min via INTRAVENOUS

## 2020-03-13 MED ORDER — MORPHINE SULFATE (PF) 2 MG/ML IV SOLN
1.0000 mg | INTRAVENOUS | Status: DC | PRN
  Administered 2020-03-13 (×3): 2 mg via INTRAVENOUS
  Administered 2020-03-14 (×5): 4 mg via INTRAVENOUS
  Filled 2020-03-13 (×2): qty 2
  Filled 2020-03-13: qty 1
  Filled 2020-03-13: qty 2
  Filled 2020-03-13: qty 1
  Filled 2020-03-13: qty 2
  Filled 2020-03-13: qty 1
  Filled 2020-03-13: qty 2

## 2020-03-13 MED ORDER — FAMOTIDINE IN NACL 20-0.9 MG/50ML-% IV SOLN
20.0000 mg | Freq: Two times a day (BID) | INTRAVENOUS | Status: AC
  Administered 2020-03-13: 20 mg via INTRAVENOUS

## 2020-03-13 MED ORDER — BUPIVACAINE LIPOSOME 1.3 % IJ SUSP
20.0000 mL | Freq: Once | INTRAMUSCULAR | Status: DC
  Filled 2020-03-13: qty 20

## 2020-03-13 MED ORDER — SODIUM CHLORIDE 0.9% FLUSH: 3.0000 mL | INTRAVENOUS | Status: DC | PRN

## 2020-03-13 MED ORDER — LACTATED RINGERS IV SOLN: 500.0000 mL | Freq: Once | INTRAVENOUS | Status: DC | PRN

## 2020-03-13 MED ORDER — SODIUM CHLORIDE 0.9 % IV SOLN
1.5000 g | Freq: Two times a day (BID) | INTRAVENOUS | Status: DC
  Filled 2020-03-13 (×2): qty 1.5

## 2020-03-13 MED ORDER — VANCOMYCIN HCL 1000 MG IV SOLR
INTRAVENOUS | Status: AC
  Filled 2020-03-13: qty 3000

## 2020-03-13 MED ORDER — MAGNESIUM SULFATE 4 GM/100ML IV SOLN
4.0000 g | Freq: Once | INTRAVENOUS | Status: AC
  Administered 2020-03-13: 4 g via INTRAVENOUS

## 2020-03-13 MED ORDER — SODIUM CHLORIDE 0.45 % IV SOLN: INTRAVENOUS | Status: DC | PRN

## 2020-03-13 MED ORDER — MAGNESIUM SULFATE 4 GM/100ML IV SOLN
INTRAVENOUS | Status: AC
  Filled 2020-03-13: qty 100

## 2020-03-13 MED ORDER — HEPARIN SODIUM (PORCINE) 1000 UNIT/ML IJ SOLN
INTRAMUSCULAR | Status: AC
  Filled 2020-03-13: qty 1

## 2020-03-13 MED ORDER — VANCOMYCIN HCL 1000 MG IV SOLR
INTRAVENOUS | Status: DC | PRN
  Administered 2020-03-13: 3 g

## 2020-03-13 MED ORDER — BISACODYL 10 MG RE SUPP: 10.0000 mg | Freq: Every day | RECTAL | Status: DC

## 2020-03-13 MED ORDER — OXYCODONE HCL 5 MG PO TABS
5.0000 mg | ORAL_TABLET | ORAL | Status: DC | PRN
  Administered 2020-03-13 – 2020-03-14 (×5): 10 mg via ORAL
  Filled 2020-03-13 (×5): qty 2

## 2020-03-13 MED ORDER — LIDOCAINE 2% (20 MG/ML) 5 ML SYRINGE
INTRAMUSCULAR | Status: AC
  Filled 2020-03-13: qty 5

## 2020-03-13 MED ORDER — ALBUMIN HUMAN 5 % IV SOLN: 250.0000 mL | INTRAVENOUS | Status: AC | PRN

## 2020-03-13 MED ORDER — ALBUTEROL SULFATE HFA 108 (90 BASE) MCG/ACT IN AERS
INHALATION_SPRAY | RESPIRATORY_TRACT | Status: DC | PRN
Start: 2020-03-13 — End: 2020-03-13
  Administered 2020-03-13: 4 via RESPIRATORY_TRACT

## 2020-03-13 MED ORDER — FENTANYL CITRATE (PF) 250 MCG/5ML IJ SOLN
INTRAMUSCULAR | Status: AC
  Filled 2020-03-13: qty 20

## 2020-03-13 MED ORDER — ARTIFICIAL TEARS OPHTHALMIC OINT
TOPICAL_OINTMENT | OPHTHALMIC | Status: DC | PRN
  Administered 2020-03-13: 1 via OPHTHALMIC

## 2020-03-13 MED ORDER — PHENYLEPHRINE HCL (PRESSORS) 10 MG/ML IV SOLN
INTRAVENOUS | Status: AC
  Filled 2020-03-13: qty 1

## 2020-03-13 MED ORDER — TRAMADOL HCL 50 MG PO TABS: 50.0000 mg | ORAL_TABLET | ORAL | Status: DC | PRN

## 2020-03-13 MED ORDER — ACETAMINOPHEN 650 MG RE SUPP
650.0000 mg | Freq: Once | RECTAL | Status: AC
  Administered 2020-03-13: 650 mg via RECTAL

## 2020-03-13 MED ORDER — MIDAZOLAM HCL 5 MG/5ML IJ SOLN
INTRAMUSCULAR | Status: DC | PRN
  Administered 2020-03-13: 1 mg via INTRAVENOUS
  Administered 2020-03-13: 2 mg via INTRAVENOUS
  Administered 2020-03-13: 5 mg via INTRAVENOUS
  Administered 2020-03-13: 2 mg via INTRAVENOUS

## 2020-03-13 MED ORDER — BUPIVACAINE LIPOSOME 1.3 % IJ SUSP
INTRAMUSCULAR | Status: DC | PRN
  Administered 2020-03-13: 50 mL

## 2020-03-13 MED ORDER — ARTIFICIAL TEARS OPHTHALMIC OINT
TOPICAL_OINTMENT | OPHTHALMIC | Status: AC
  Filled 2020-03-13: qty 10.5

## 2020-03-13 MED ORDER — PROTAMINE SULFATE 10 MG/ML IV SOLN
INTRAVENOUS | Status: AC
  Filled 2020-03-13: qty 50

## 2020-03-13 MED ORDER — STERILE WATER FOR INJECTION IJ SOLN
INTRAMUSCULAR | Status: AC
  Filled 2020-03-13: qty 10

## 2020-03-13 MED ORDER — BISACODYL 5 MG PO TBEC
10.0000 mg | DELAYED_RELEASE_TABLET | Freq: Every day | ORAL | Status: DC
  Administered 2020-03-14 – 2020-03-18 (×5): 10 mg via ORAL
  Filled 2020-03-13 (×6): qty 2

## 2020-03-13 MED ORDER — 0.9 % SODIUM CHLORIDE (POUR BTL) OPTIME
TOPICAL | Status: DC | PRN
  Administered 2020-03-13: 5000 mL

## 2020-03-13 SURGICAL SUPPLY — 116 items
ADAPTER CARDIO PERF ANTE/RETRO (ADAPTER) ×4 IMPLANT
ADH SKN CLS APL DERMABOND .7 (GAUZE/BANDAGES/DRESSINGS) ×3
ADPR PRFSN 84XANTGRD RTRGD (ADAPTER) ×3
APL SRG 7X2 LUM MLBL SLNT (VASCULAR PRODUCTS) ×3
APPLICATOR TIP COSEAL (VASCULAR PRODUCTS) ×1 IMPLANT
APPLIER CLIP 9.375 SM OPEN (CLIP)
APR CLP SM 9.3 20 MLT OPN (CLIP)
BAG DECANTER FOR FLEXI CONT (MISCELLANEOUS) ×4 IMPLANT
BLADE CLIPPER SURG (BLADE) ×4 IMPLANT
BLADE STERNUM SYSTEM 6 (BLADE) ×4 IMPLANT
BLADE SURG 15 STRL LF DISP TIS (BLADE) ×3 IMPLANT
BLADE SURG 15 STRL SS (BLADE) ×8
BNDG ELASTIC 4X5.8 VLCR STR LF (GAUZE/BANDAGES/DRESSINGS) ×4 IMPLANT
BNDG ELASTIC 6X5.8 VLCR STR LF (GAUZE/BANDAGES/DRESSINGS) ×3 IMPLANT
BNDG GAUZE ELAST 4 BULKY (GAUZE/BANDAGES/DRESSINGS) ×4 IMPLANT
CANISTER SUCT 3000ML PPV (MISCELLANEOUS) ×4 IMPLANT
CANISTER WOUNDNEG PRESSURE 500 (CANNISTER) ×1 IMPLANT
CANNULA NON VENT 22FR 12 (CANNULA) ×1 IMPLANT
CATH CPB KIT HENDRICKSON (MISCELLANEOUS) ×4 IMPLANT
CATH ROBINSON RED A/P 18FR (CATHETERS) ×8 IMPLANT
CLIP APPLIE 9.375 SM OPEN (CLIP) ×3 IMPLANT
CLIP RETRACTION 3.0MM CORONARY (MISCELLANEOUS) ×3 IMPLANT
CLIP VESOCCLUDE MED 24/CT (CLIP) IMPLANT
CLIP VESOCCLUDE SM WIDE 24/CT (CLIP) ×3 IMPLANT
CONN ST 1/4X3/8  BEN (MISCELLANEOUS) ×8
CONN ST 1/4X3/8 BEN (MISCELLANEOUS) IMPLANT
COVER MAYO STAND STRL (DRAPES) ×4 IMPLANT
CUFF TOURN SGL QUICK 18X4 (TOURNIQUET CUFF) IMPLANT
CUFF TOURN SGL QUICK 24 (TOURNIQUET CUFF)
CUFF TRNQT CYL 24X4X16.5-23 (TOURNIQUET CUFF) IMPLANT
DERMABOND ADVANCED (GAUZE/BANDAGES/DRESSINGS) ×1
DERMABOND ADVANCED .7 DNX12 (GAUZE/BANDAGES/DRESSINGS) ×3 IMPLANT
DRAIN CHANNEL 28F RND 3/8 FF (WOUND CARE) ×13 IMPLANT
DRAPE CARDIOVASCULAR INCISE (DRAPES) ×4
DRAPE EXTREMITY T 121X128X90 (DISPOSABLE) ×4 IMPLANT
DRAPE HALF SHEET 40X57 (DRAPES) ×4 IMPLANT
DRAPE SLUSH/WARMER DISC (DRAPES) ×4 IMPLANT
DRAPE SRG 135X102X78XABS (DRAPES) ×3 IMPLANT
DRSG AQUACEL AG ADV 3.5X14 (GAUZE/BANDAGES/DRESSINGS) ×4 IMPLANT
ELECT CAUTERY BLADE 6.4 (BLADE) ×4 IMPLANT
ELECT REM PT RETURN 9FT ADLT (ELECTROSURGICAL) ×8
ELECTRODE REM PT RTRN 9FT ADLT (ELECTROSURGICAL) ×6 IMPLANT
FELT TEFLON 1X6 (MISCELLANEOUS) ×7 IMPLANT
GAUZE SPONGE 4X4 12PLY STRL (GAUZE/BANDAGES/DRESSINGS) ×8 IMPLANT
GAUZE SPONGE 4X4 12PLY STRL LF (GAUZE/BANDAGES/DRESSINGS) ×1 IMPLANT
GEL ULTRASOUND 20GR AQUASONIC (MISCELLANEOUS) ×3 IMPLANT
GLOVE BIO SURGEON STRL SZ 6 (GLOVE) ×3 IMPLANT
GLOVE BIO SURGEON STRL SZ 6.5 (GLOVE) ×1 IMPLANT
GLOVE BIOGEL PI IND STRL 6 (GLOVE) IMPLANT
GLOVE BIOGEL PI INDICATOR 6 (GLOVE) ×6
GLOVE NEODERM STRL 7.5  LF PF (GLOVE) ×9
GLOVE NEODERM STRL 7.5 LF PF (GLOVE) ×9 IMPLANT
GLOVE SURG NEODERM 7.5  LF PF (GLOVE) ×3
GOWN STRL REUS W/ TWL LRG LVL3 (GOWN DISPOSABLE) ×12 IMPLANT
GOWN STRL REUS W/TWL LRG LVL3 (GOWN DISPOSABLE) ×32
HEMOSTAT POWDER SURGIFOAM 1G (HEMOSTASIS) ×8 IMPLANT
INSERT FOGARTY XLG (MISCELLANEOUS) ×1 IMPLANT
KIT BASIN OR (CUSTOM PROCEDURE TRAY) ×4 IMPLANT
KIT PREVENA INCISION MGT20CM45 (CANNISTER) ×1 IMPLANT
KIT SUCTION CATH 14FR (SUCTIONS) ×4 IMPLANT
KIT TURNOVER KIT B (KITS) ×4 IMPLANT
KIT VASOVIEW HEMOPRO 2 VH 4000 (KITS) ×3 IMPLANT
MARKER GRAFT CORONARY BYPASS (MISCELLANEOUS) ×12 IMPLANT
NDL 18GX1X1/2 (RX/OR ONLY) (NEEDLE) ×3 IMPLANT
NEEDLE 18GX1X1/2 (RX/OR ONLY) (NEEDLE) ×4 IMPLANT
NS IRRIG 1000ML POUR BTL (IV SOLUTION) ×20 IMPLANT
PACK E OPEN HEART (SUTURE) ×4 IMPLANT
PACK OPEN HEART (CUSTOM PROCEDURE TRAY) ×4 IMPLANT
PACK SPY-PHI (KITS) ×1 IMPLANT
PAD ARMBOARD 7.5X6 YLW CONV (MISCELLANEOUS) ×8 IMPLANT
PAD ELECT DEFIB RADIOL ZOLL (MISCELLANEOUS) ×4 IMPLANT
PENCIL BUTTON HOLSTER BLD 10FT (ELECTRODE) ×4 IMPLANT
POSITIONER HEAD DONUT 9IN (MISCELLANEOUS) ×4 IMPLANT
POWDER SURGICEL 3.0 GRAM (HEMOSTASIS) ×1 IMPLANT
PUNCH AORTIC ROTATE 4.0MM (MISCELLANEOUS) ×1 IMPLANT
SEALANT SURG COSEAL 8ML (VASCULAR PRODUCTS) ×1 IMPLANT
SET CARDIOPLEGIA MPS 5001102 (MISCELLANEOUS) ×1 IMPLANT
SHEARS HARMONIC 9CM CVD (BLADE) ×1 IMPLANT
STAPLER VISISTAT 35W (STAPLE) ×1 IMPLANT
SUPPORT HEART JANKE-BARRON (MISCELLANEOUS) ×4 IMPLANT
SUT BONE WAX W31G (SUTURE) ×4 IMPLANT
SUT MNCRL AB 3-0 PS2 18 (SUTURE) ×8 IMPLANT
SUT PDS AB 1 CTX 36 (SUTURE) ×8 IMPLANT
SUT PROLENE 3 0 SH DA (SUTURE) ×4 IMPLANT
SUT PROLENE 5 0 C 1 36 (SUTURE) IMPLANT
SUT PROLENE 6 0 C 1 30 (SUTURE) ×11 IMPLANT
SUT PROLENE 7 0 BV 1 (SUTURE) ×2 IMPLANT
SUT PROLENE 8 0 BV175 6 (SUTURE) IMPLANT
SUT PROLENE BLUE 7 0 (SUTURE) ×4 IMPLANT
SUT SILK  1 MH (SUTURE) ×8
SUT SILK 1 MH (SUTURE) IMPLANT
SUT SILK 2 0 SH CR/8 (SUTURE) IMPLANT
SUT SILK 3 0 SH CR/8 (SUTURE) IMPLANT
SUT STEEL 6MS V (SUTURE) ×3 IMPLANT
SUT STEEL SZ 6 DBL 3X14 BALL (SUTURE) ×5 IMPLANT
SUT VIC AB 2-0 CT1 27 (SUTURE) ×4
SUT VIC AB 2-0 CT1 TAPERPNT 27 (SUTURE) IMPLANT
SUT VIC AB 2-0 CTX 27 (SUTURE) IMPLANT
SUT VIC AB 3-0 SH 27 (SUTURE)
SUT VIC AB 3-0 SH 27X BRD (SUTURE) IMPLANT
SUT VIC AB 3-0 X1 27 (SUTURE) IMPLANT
SYR 10ML LL (SYRINGE) ×1 IMPLANT
SYR 30ML LL (SYRINGE) ×4 IMPLANT
SYR 3ML LL SCALE MARK (SYRINGE) ×4 IMPLANT
SYR 50ML SLIP (SYRINGE) IMPLANT
SYSTEM SAHARA CHEST DRAIN ATS (WOUND CARE) ×5 IMPLANT
TAPE CLOTH SURG 4X10 WHT LF (GAUZE/BANDAGES/DRESSINGS) ×1 IMPLANT
TAPE PAPER 2X10 WHT MICROPORE (GAUZE/BANDAGES/DRESSINGS) ×1 IMPLANT
TOWEL GREEN STERILE (TOWEL DISPOSABLE) ×4 IMPLANT
TOWEL GREEN STERILE FF (TOWEL DISPOSABLE) ×4 IMPLANT
TRAY FOLEY SLVR 16FR TEMP STAT (SET/KITS/TRAYS/PACK) ×4 IMPLANT
TUBING ART PRESS 48 MALE/FEM (TUBING) ×2 IMPLANT
TUBING LAP HI FLOW INSUFFLATIO (TUBING) ×3 IMPLANT
UNDERPAD 30X30 (UNDERPADS AND DIAPERS) ×4 IMPLANT
WATER STERILE IRR 1000ML POUR (IV SOLUTION) ×8 IMPLANT
WATER STERILE IRR 1000ML UROMA (IV SOLUTION) IMPLANT

## 2020-03-13 NOTE — Progress Notes (Signed)
  Echocardiogram Echocardiogram Transesophageal has been performed.  Darlina Sicilian M 03/13/2020, 8:24 AM

## 2020-03-13 NOTE — Anesthesia Procedure Notes (Signed)
Central Venous Catheter Insertion Performed by: Roderic Palau, MD, anesthesiologist Start/End5/02/2020 6:30 AM, 03/13/2020 6:45 AM Patient location: Pre-op. Preanesthetic checklist: patient identified, IV checked, site marked, risks and benefits discussed, surgical consent, monitors and equipment checked, pre-op evaluation, timeout performed and anesthesia consent Hand hygiene performed  and maximum sterile barriers used  PA cath was placed.Swan type:thermodilution PA Cath depth:55 Procedure performed without using ultrasound guided technique. Attempts: 1 Patient tolerated the procedure well with no immediate complications.

## 2020-03-13 NOTE — Anesthesia Postprocedure Evaluation (Signed)
Anesthesia Post Note  Patient: Shane Chambers  Procedure(s) Performed: CORONARY ARTERY BYPASS GRAFTING (CABG) times five using bilateral Internal mammary arteries and left radial artery (N/A Chest) RADIAL ARTERY HARVEST, (Left Arm Lower) TRANSESOPHAGEAL ECHOCARDIOGRAM (TEE) (N/A )     Patient location during evaluation: SICU Anesthesia Type: General Level of consciousness: patient remains intubated per anesthesia plan and sedated Pain management: pain level controlled Vital Signs Assessment: post-procedure vital signs reviewed and stable Respiratory status: patient on ventilator - see flowsheet for VS and patient remains intubated per anesthesia plan Cardiovascular status: stable Postop Assessment: no apparent nausea or vomiting Anesthetic complications: no    Last Vitals:  Vitals:   03/13/20 1445 03/13/20 1500  BP: 97/64 (!) 92/57  Pulse: 89 89  Resp: 18 17  Temp: (!) 36.4 C (!) 36.4 C  SpO2: 98% 99%    Last Pain:  Vitals:   03/13/20 0530  TempSrc: Axillary  PainSc:                  Shane Chambers,Shane Chambers

## 2020-03-13 NOTE — Consult Note (Signed)
[address]       [place] [address]             [phone]        Dermot B Luepke Piggott Medical Record [id number] Date of Birth: [birth year]  Referring: No ref. provider found Primary Care: [name], MD Primary Cardiologist:Branch, Roderic Palau, MD  Chief Complaint:    Chief Complaint  Patient presents with  . Shortness of Breath    History of Present Illness:      59 yo man was in [place] with underlying risk factors for CAD of HTN, DM and according to records "chronically elevated troponin" began to experience extreme shortness of breath late last week. He sought attention at [facility] where he ruled in for MI by enzymes. Transferred to [facility] where LHC showed severe CAD and depressed LV function. Referred for CABG. He has been chest pain free and has had improved shortness of breath since admission. Stable laboratory values. His pre-operative workup for CABG does not show prohibitive risk and he appears a good candidate for the procedure   Current Activity/ Functional Status: Patient will be independent with mobility/ambulation, transfers, ADL's, IADL's.   Zubrod Score: At the time of surgery this patient's most appropriate activity status/level should be described as: []     0    Normal activity, no symptoms [x]     1    Restricted in physical strenuous activity but ambulatory, able to do out light work []     2    Ambulatory and capable of self care, unable to do work activities, up and about                 more than 50%  Of the time                            []     3    Only limited self care, in bed greater than 50% of waking hours []     4    Completely disabled, no self care, confined to bed or chair []     5    Moribund  Past Medical History:  Diagnosis Date  . Depression   . Diabetes mellitus without complication ([place])   . Hyperlipidemia 12/01/2019  . Hypertension   . Type 2 diabetes mellitus ([place])     Past Surgical History:  Procedure  Laterality Date  . DENTAL SURGERY    . RIGHT/LEFT HEART CATH AND CORONARY ANGIOGRAPHY N/A 03/07/2020   Procedure: RIGHT/LEFT HEART CATH AND CORONARY ANGIOGRAPHY;  Surgeon: Sherren Mocha, MD;  Location: [facility];  Service: Cardiovascular;  Laterality: N/A;    Social History   Tobacco Use  Smoking Status Former Smoker  Smokeless Tobacco Never Used    Social History   Substance and Sexual Activity  Alcohol Use Yes   Comment: rarely     Allergies  Allergen Reactions  . Ace Inhibitors Swelling and Cough  . Other Itching    [name]  . Penicillins     Has patient had a PCN reaction causing immediate rash, facial/tongue/throat swelling, SOB or lightheadedness with hypotension: Unknown Has patient had a PCN reaction causing severe rash involving mucus membranes or skin necrosis: Unknown Has patient had a PCN reaction that required hospitalization: Unknown Has patient had a PCN reaction occurring within the last 10 years: Unknown If all of the above answers are "NO", then  may proceed with Cephalosporin use.     Current Facility-Administered Medications  Medication Dose Route Frequency Provider Last Rate Last Admin  . [MAR Hold] 0.9 %  sodium chloride infusion  250 mL Intravenous PRN Sherren Mocha, MD 10 mL/hr at 03/13/20 0630 Continued from Pre-op at 03/13/20 0630  . [MAR Hold] acetaminophen (TYLENOL) tablet 650 mg  650 mg Oral Q6H PRN Damita Lack, MD   650 mg at 03/12/20 2225  . [MAR Hold] albuterol (PROVENTIL) (2.5 MG/3ML) 0.083% nebulizer solution 2.5 mg  2.5 mg Inhalation Q6H PRN Howerter, Justin B, DO   2.5 mg at 03/06/20 2023  . [MAR Hold] aspirin EC tablet 81 mg  81 mg Oral Daily Howerter, Justin B, DO   81 mg at 03/12/20 0858  . [MAR Hold] atorvastatin (LIPITOR) tablet 80 mg  80 mg Oral QHS Sande Rives E, PA-C   80 mg at 03/12/20 2225  . bupivacaine liposome (EXPAREL) 1.3 % injection 266 mg  20 mL Infiltration Once Rica Heather, Glenice Bow, MD      .  dexmedetomidine (PRECEDEX) 400 MCG/100ML (4 mcg/mL) infusion  0.1-0.7 mcg/kg/hr Intravenous To OR Janzen Sacks Z, MD      . EPINEPHrine (ADRENALIN) 4 mg in NS 250 mL (0.016 mg/mL) premix infusion  0-10 mcg/min Intravenous To OR [name], MD      . Doug Sou Hold] ferrous sulfate tablet 325 mg  325 mg Oral Q breakfast Howerter, Justin B, DO   325 mg at 03/12/20 0858  . [MAR Hold] FLUoxetine (PROZAC) capsule 10 mg  10 mg Oral Daily Amin, Ankit Chirag, MD   10 mg at 03/12/20 0857  . [MAR Hold] furosemide (LASIX) tablet 40 mg  40 mg Oral Daily Lelon Perla, MD   40 mg at 03/12/20 0857  . [MAR Hold] gabapentin (NEURONTIN) capsule 900 mg  900 mg Oral BID Howerter, Justin B, DO   900 mg at 03/12/20 2225  . heparin 30,000 units/NS 1000 mL solution for CELLSAVER   Other To OR Margretta Zamorano, Glenice Bow, MD      . heparin ADULT infusion 100 units/mL (25000 units/268mL sodium chloride 0.45%)  1,500 Units/hr Intravenous Continuous Amin, Ankit Chirag, MD 15 mL/hr at 03/12/20 1446 1,500 Units/hr at 03/12/20 1446  . heparin sodium (porcine) 2,500 Units, papaverine 30 mg in electrolyte-148 (PLASMALYTE-148) 500 mL irrigation   Irrigation To OR Kennady Zimmerle, Glenice Bow, MD      . Doug Sou Hold] hydrALAZINE (APRESOLINE) tablet 10 mg  10 mg Oral Q8H Lelon Perla, MD   10 mg at 03/12/20 2225  . [MAR Hold] insulin aspart (novoLOG) injection 0-20 Units  0-20 Units Subcutaneous TID WC Amin, Jeanella Flattery, MD   11 Units at 03/12/20 1648  . [MAR Hold] insulin aspart (novoLOG) injection 0-5 Units  0-5 Units Subcutaneous QHS Amin, Ankit Chirag, MD      . Doug Sou Hold] insulin aspart (novoLOG) injection 5 Units  5 Units Subcutaneous TID WC Amin, Ankit Chirag, MD   5 Units at 03/12/20 1649  . [MAR Hold] insulin glargine (LANTUS) injection 19 Units  19 Units Subcutaneous BID Damita Lack, MD   19 Units at 03/12/20 2225  . insulin regular, human (MYXREDLIN) 100 units/ 100 mL infusion   Intravenous To OR Romney Compean Z, MD      .  levofloxacin (LEVAQUIN) IVPB 500 mg  500 mg Intravenous To OR Derotha Fishbaugh Z, MD      . magnesium sulfate (IV Push/IM) injection 40 mEq  40 mEq Other To OR Jaxsyn Azam, Glenice Bow, MD      . Doug Sou Hold] metoprolol succinate (TOPROL-XL) 24 hr tablet 100 mg  100 mg Oral Daily Arnoldo Lenis, MD   100 mg at 03/12/20 0857  . milrinone (PRIMACOR) 20 MG/100 ML (0.2 mg/mL) infusion  0.3 mcg/kg/min Intravenous To OR Gricel Copen, Glenice Bow, MD      . Doug Sou Hold] morphine 2 MG/ML injection 1 mg  1 mg Intravenous Q4H PRN Lynetta Mare, MD   1 mg at 03/06/20 2014  . [MAR Hold] mupirocin ointment (BACTROBAN) 2 % 1 application  1 application Nasal BID [name], MD   1 application at 0000000 0530  . [MAR Hold] nitroGLYCERIN (NITROSTAT) SL tablet 0.4 mg  0.4 mg Sublingual Q5 min PRN Lynetta Mare, MD      . Doug Sou Hold] nitroGLYCERIN 50 mg in dextrose 5 % 250 mL (0.2 mg/mL) infusion  0-200 mcg/min Intravenous Titrated Arnoldo Lenis, MD 6 mL/hr at 03/12/20 2341 20 mcg/min at 03/12/20 2341  . norepinephrine (LEVOPHED) 4mg  in 215mL premix infusion  0-40 mcg/min Intravenous To OR Loraina Stauffer, Glenice Bow, MD      . Doug Sou Hold] ondansetron (ZOFRAN) injection 4 mg  4 mg Intravenous Q6H PRN Sherren Mocha, MD      . phenylephrine (NEOSYNEPHRINE) 20-0.9 MG/250ML-% infusion  30-200 mcg/min Intravenous To OR Leonidas Boateng, Glenice Bow, MD      . Doug Sou Hold] polyethylene glycol (MIRALAX / GLYCOLAX) packet 17 g  17 g Oral Daily PRN Damita Lack, MD   17 g at 03/09/20 2036  . potassium chloride injection 80 mEq  80 mEq Other To OR Eligah Anello, Glenice Bow, MD      . Doug Sou Hold] senna-docusate (Senokot-S) tablet 2 tablet  2 tablet Oral QHS PRN Amin, Jeanella Flattery, MD      . Doug Sou Hold] sodium chloride flush (NS) 0.9 % injection 3 mL  3 mL Intravenous Q12H Howerter, Justin B, DO   3 mL at 03/10/20 2207  . [MAR Hold] sodium chloride flush (NS) 0.9 % injection 3 mL  3 mL Intravenous Q12H Erma Heritage, PA-C   3 mL at 03/07/20 [id number]    . [MAR Hold] sodium chloride flush (NS) 0.9 % injection 3 mL  3 mL Intravenous Q12H Sherren Mocha, MD   3 mL at 03/12/20 0901  . [MAR Hold] sodium chloride flush (NS) 0.9 % injection 3 mL  3 mL Intravenous PRN Sherren Mocha, MD      . Doug Sou Hold] spironolactone (ALDACTONE) tablet 12.5 mg  12.5 mg Oral Daily Lelon Perla, MD   12.5 mg at 03/12/20 0858  . [MAR Hold] tamsulosin (FLOMAX) capsule 0.4 mg  0.4 mg Oral Daily Howerter, Justin B, DO   0.4 mg at 03/12/20 0856  . tranexamic acid (CYKLOKAPRON) 2,500 mg in sodium chloride 0.9 % 250 mL (10 mg/mL) infusion  1.5 mg/kg/hr Intravenous To OR Nuha Degner, Glenice Bow, MD      . tranexamic acid (CYKLOKAPRON) bolus via infusion - over 30 minutes 1,935 mg  15 mg/kg Intravenous To OR Lathyn Griggs Z, MD      . tranexamic acid (CYKLOKAPRON) pump prime solution 258 mg  2 mg/kg Intracatheter To OR Lilliona Blakeney Z, MD      . vancomycin (VANCOREADY) IVPB 1500 mg/300 mL  1,500 mg Intravenous To OR Christal Lagerstrom, Glenice Bow, MD      . Doug Sou Hold] venlafaxine XR (EFFEXOR-XR) 24 hr capsule 112.5 mg  112.5  mg Oral Q breakfast Amin, Ankit Chirag, MD   112.5 mg at 03/12/20 [id number]   Facility-Administered Medications Ordered in Other Encounters  Medication Dose Route Frequency Provider Last Rate Last Admin  . fentaNYL citrate (PF) (SUBLIMAZE) injection   Intravenous Anesthesia Intra-op Leonor Liv, CRNA   50 mcg at 03/13/20 [id number]  . lactated ringers infusion   Intravenous Continuous PRN Leonor Liv, CRNA   New Bag at 03/13/20 0630  . midazolam (VERSED) 5 MG/5ML injection   Intravenous Anesthesia Intra-op Leonor Liv, CRNA   1 mg at 03/13/20 [id number]    Medications Prior to Admission  Medication Sig Dispense Refill Last Dose  . albuterol (VENTOLIN HFA) 108 (90 Base) MCG/ACT inhaler Inhale 1-2 puffs into the lungs every 6 (six) hours as needed for wheezing or shortness of breath.    03/04/2020  . amLODipine (NORVASC) 10 MG tablet Take 10 mg by mouth daily.   03/03/2020  .  aspirin 81 MG EC tablet Take 81 mg by mouth daily.    03/04/2020 at 0000  . atorvastatin (LIPITOR) 40 MG tablet Take 40 mg by mouth daily.   03/03/2020  . calcium carbonate (OS-CAL - DOSED IN MG OF ELEMENTAL CALCIUM) 1250 (500 Ca) MG tablet Take 1 tablet by mouth daily.   Past Week at Unknown time  . chlorthalidone (HYGROTON) 25 MG tablet Take 25 mg by mouth daily.   03/03/2020  . CINNAMON PO Take 1 capsule by mouth daily.   Past Week at Unknown time  . docusate sodium (COLACE) 100 MG capsule Take 1 capsule (100 mg total) by mouth 2 (two) times daily. (Patient taking differently: Take 100 mg by mouth 2 (two) times daily as needed. ) 10 capsule 0   . fluticasone (VERAMYST) 27.5 MCG/SPRAY nasal spray Place 2 sprays into the nose daily as needed for rhinitis or allergies.    03/03/2020  . furosemide (LASIX) 20 MG tablet Take 1 tablet (20 mg total) by mouth daily. Take an extra 20mg  by mouth if noted to have greater than 3 lb weight gain in 24 hours. (Patient taking differently: Take 20 mg by mouth 2 (two) times daily as needed. Take an extra 20mg  by mouth if noted to have greater than 3 lb weight gain in 24 hours.) 30 tablet 11 03/03/2020  . gabapentin (NEURONTIN) 300 MG capsule Take 900 mg by mouth 2 (two) times daily.    03/03/2020  . indomethacin (INDOCIN) 25 MG capsule Take 25 mg by mouth 2 (two) times daily as needed for mild pain or moderate pain.      Marland Kitchen insulin aspart protamine- aspart (NOVOLOG MIX 70/30) (70-30) 100 UNIT/ML injection Inject 0.55 mLs (55 Units total) into the skin 2 (two) times daily with a meal. (Patient taking differently: Inject 65 Units into the skin 2 (two) times daily with a meal. ) 10 mL 11 03/04/2020 at 0000  . Iron, Ferrous Sulfate, 325 (65 Fe) MG TABS Take 1 tablet by mouth daily. 30 tablet 1 03/03/2020  . MAGNESIUM PO Take 1 tablet by mouth daily.   Past Week at Unknown time  . metFORMIN (GLUCOPHAGE) 1000 MG tablet Take 1,000 mg by mouth 2 (two) times daily with a meal.    03/04/2020 at 0000  . metoprolol (TOPROL-XL) 200 MG 24 hr tablet Take 200 mg by mouth daily.   03/04/2020 at 0000  . Multiple Vitamins-Minerals (TAB-A-VITE) TABS Take 1 tablet by mouth daily.   03/03/2020  . omeprazole (PRILOSEC) 40 MG  capsule Take 40 mg by mouth daily.   03/03/2020  . polyethylene glycol (MIRALAX / GLYCOLAX) 17 g packet Take 17 g by mouth daily as needed for mild constipation. 14 each 0   . potassium chloride (KLOR-CON) 10 MEQ tablet Take 1 tablet (10 mEq total) by mouth daily. 30 tablet 2 03/03/2020  . tamsulosin (FLOMAX) 0.4 MG CAPS capsule Take 0.4 mg by mouth daily.   03/03/2020  . venlafaxine XR (EFFEXOR-XR) 150 MG 24 hr capsule Take 150 mg by mouth daily with breakfast.   03/03/2020    Family History  Problem Relation Age of Onset  . Hypertension Mother      Review of Systems:   ROS A comprehensive review of systems was negative except for: Constitutional: positive for malaise     Cardiac Review of Systems: Y or  [    ]= no  Chest Pain [    ]  Resting SOB [   ] Exertional SOB  [  ]  Orthopnea [  ]   Pedal Edema [   ]    Palpitations [  ] Syncope  [  ]   Presyncope [   ]  General Review of Systems: [Y] = yes [  ]=no Constitional: recent weight change [  ]; anorexia [  ]; fatigue [  ]; nausea [  ]; night sweats [  ]; fever [  ]; or chills [  ]                                                               Dental: Last Dentist visit:   Eye : blurred vision [  ]; diplopia [   ]; vision changes [  ];  Amaurosis fugax[  ]; Resp: cough [  ];  wheezing[  ];  hemoptysis[  ]; shortness of breath[  ]; paroxysmal nocturnal dyspnea[  ]; dyspnea on exertion[  ]; or orthopnea[  ];  GI:  gallstones[  ], vomiting[  ];  dysphagia[  ]; melena[  ];  hematochezia [  ]; heartburn[  ];   Hx of  Colonoscopy[  ]; GU: kidney stones [  ]; hematuria[  ];   dysuria [  ];  nocturia[  ];  history of     Obstruction--BPH [ x ]; urinary frequency [  ]             Skin: rash, swelling[  ];, hair loss[   ];  peripheral edema[  ];  or itching[  ]; Musculosketetal: myalgias[  ];  joint swelling[  ];  joint erythema[  ];  joint pain[  ];  back pain[  ];  Heme/Lymph: bruising[  ];  bleeding[  ];  anemia[  ];  Neuro: TIA[  ];  headaches[  ];  stroke[  ];  vertigo[  ];  seizures[  ];   paresthesias[  ];  difficulty walking[  ];  Psych:depression[  ]; anxiety[  ];  Endocrine: diabetes[  ];  thyroid dysfunction[  ];         Physical Exam: BP 118/80 (BP Location: Left Arm)   Pulse 79   Temp (!) 97.5 F (36.4 C) (Axillary)   Resp 18   Ht 5\' 9"  (1.753 m)   Wt 129.5 kg  SpO2 94%   BMI 42.16 kg/m    General appearance: alert and cooperative Head: Normocephalic, without obvious abnormality, atraumatic Resp: clear to auscultation bilaterally Cardio: regular rate and rhythm, S1, S2 normal, no murmur, click, rub or gallop GI: soft, non-tender; bowel sounds normal; no masses,  no organomegaly Extremities: extremities normal, atraumatic, no cyanosis or edema Neurologic: Alert and oriented X 3, normal strength and tone. Normal symmetric reflexes. Normal coordination and gait   I have personally performed plethysmography of his palmar circulation bilaterally with oxygen saturation probe and can verify that with simulated occlusion of either radial artery, perfusion of the thumb persists via the ulnar artery.  Diagnostic Studies & Laboratory data:     Recent Radiology Findings:   DG Chest 2 View  Result Date: 03/12/2020 CLINICAL DATA:  Extreme shortness of breath. Recent diagnosis of non ST-elevation MI. EXAM: CHEST - 2 VIEW COMPARISON:  03/05/2020 FINDINGS: Normal heart size. Decreased lung volumes. Mild subsegmental atelectasis within the right lung base noted. Similar to previous exam. No pleural effusion or edema. IMPRESSION: Decreased lung volumes with persistent right base atelectasis. Electronically Signed   By: Kerby Moors M.D.   On: 03/12/2020 08:39   VAS US DOPPLER PRE CABG  Result  Date: 03/12/2020 PREOPERATIVE VASCULAR EVALUATION  Indications:  Pre-CABG. Risk Factors: Hypertension, hyperlipidemia, Diabetes. Performing Technologist: Abram Sander RVS  Examination Guidelines: A complete evaluation includes B-mode imaging, spectral Doppler, color Doppler, and power Doppler as needed of all accessible portions of each vessel. Bilateral testing is considered an integral part of a complete examination. Limited examinations for reoccurring indications may be performed as noted.  Right Carotid Findings: +----------+--------+--------+--------+------------+--------+           PSV cm/sEDV cm/sStenosisDescribe    Comments +----------+--------+--------+--------+------------+--------+ CCA Prox  55                      heterogenous         +----------+--------+--------+--------+------------+--------+ CCA Distal84      13              heterogenous         +----------+--------+--------+--------+------------+--------+ ICA Prox  106     23      1-39%   heterogenous         +----------+--------+--------+--------+------------+--------+ ICA Distal78      17                                   +----------+--------+--------+--------+------------+--------+ ECA       128                                          +----------+--------+--------+--------+------------+--------+  +----------+--------+-------+--------+------------+           PSV cm/sEDV cmsDescribeArm Pressure +----------+--------+-------+--------+------------+ Subclavian65                                  +----------+--------+-------+--------+------------+ +---------+--------+--+--------+--+---------+ VertebralPSV cm/s49EDV cm/s16Antegrade +---------+--------+--+--------+--+---------+ Left Carotid Findings: +----------+--------+--------+--------+------------+--------+           PSV cm/sEDV cm/sStenosisDescribe    Comments +----------+--------+--------+--------+------------+--------+ CCA Prox   70                      heterogenous         +----------+--------+--------+--------+------------+--------+  CCA Distal65      10              heterogenous         +----------+--------+--------+--------+------------+--------+ ICA Prox  99      21      1-39%   heterogenous         +----------+--------+--------+--------+------------+--------+ ICA Distal62      12                                   +----------+--------+--------+--------+------------+--------+ ECA       142                                          +----------+--------+--------+--------+------------+--------+ +----------+--------+--------+--------+------------+ SubclavianPSV cm/sEDV cm/sDescribeArm Pressure +----------+--------+--------+--------+------------+           101                                  +----------+--------+--------+--------+------------+ +---------+--------+--+--------+--+---------+ VertebralPSV cm/s51EDV cm/s13Antegrade +---------+--------+--+--------+--+---------+  ABI Findings: +--------+------------------+-----+---------+--------+ Right   Rt Pressure (mmHg)IndexWaveform Comment  +--------+------------------+-----+---------+--------+ Brachial                       triphasic         +--------+------------------+-----+---------+--------+ ATA                            triphasic         +--------+------------------+-----+---------+--------+ PTA                            triphasic         +--------+------------------+-----+---------+--------+ +--------+------------------+-----+---------+-------+ Left    Lt Pressure (mmHg)IndexWaveform Comment +--------+------------------+-----+---------+-------+ Brachial                       triphasic        +--------+------------------+-----+---------+-------+ ATA                            triphasic        +--------+------------------+-----+---------+-------+ PTA                            triphasic         +--------+------------------+-----+---------+-------+  Right Doppler Findings: +--------+--------+-----+---------+--------+ Site    PressureIndexDoppler  Comments +--------+--------+-----+---------+--------+ Brachial             triphasic         +--------+--------+-----+---------+--------+ Radial               triphasic         +--------+--------+-----+---------+--------+ Ulnar                triphasic         +--------+--------+-----+---------+--------+  Left Doppler Findings: +--------+--------+-----+---------+--------+ Site    PressureIndexDoppler  Comments +--------+--------+-----+---------+--------+ Brachial             triphasic         +--------+--------+-----+---------+--------+ Radial               triphasic         +--------+--------+-----+---------+--------+ Ulnar  triphasic         +--------+--------+-----+---------+--------+  Summary: Right Carotid: Velocities in the right ICA are consistent with a 1-39% stenosis. Left Carotid: Velocities in the left ICA are consistent with a 1-39% stenosis. Right Upper Extremity: Doppler waveforms remain within normal limits with right radial compression. Doppler waveforms remain within normal limits with right ulnar compression. Left Upper Extremity: Doppler waveforms remain within normal limits with left radial compression. Doppler waveform obliterate with left ulnar compression.     Preliminary    ECHOCARDIOGRAM LIMITED  Result Date: 03/12/2020    ECHOCARDIOGRAM LIMITED REPORT   Patient Name:   LONDELL MADRIL Date of Exam: 03/12/2020 Medical Rec #:  [id number]       Height:       69.0 in Accession #:    [id number]      Weight:       284.4 lb Date of Birth:  [birth year]       BSA:          2.399 m Patient Age:    56 years        BP:           144/77 mmHg Patient Gender: M               HR:           72 bpm. Exam Location:  Inpatient Procedure: Limited Color Doppler, Cardiac Doppler, Limited Echo and  Intracardiac            Opacification Agent                                 MODIFIED REPORT: This report was modified by Candee Furbish MD on 03/12/2020 due to Aortic dilatation.  Indications:     CHF-Acute Systolic [id number] / [id number]  History:         Patient has prior history of Echocardiogram examinations, most                  recent 03/06/2020. Acute MI, Signs/Symptoms:Shortness of Breath;                  Risk Factors:Hypertension, Diabetes and Dyslipidemia. Ischemic                  cardiomyopathy.  Sonographer:     Darlina Sicilian RDCS Referring Phys:  [id number] Z Khaila Velarde Diagnosing Phys: Candee Furbish MD  Sonographer Comments: Image acquisition challenging due to uncooperative patient. IMPRESSIONS  1. Left ventricular ejection fraction, by estimation, is 35 to 40%. The left ventricle has moderately decreased function. The left ventricle has no regional wall motion abnormalities. There is mild left ventricular hypertrophy. Left ventricular diastolic parameters are consistent with Grade II diastolic dysfunction (pseudonormalization).  2. Right ventricular systolic function is normal. The right ventricular size is normal.  3. The mitral valve is normal in structure. Trivial mitral valve regurgitation. No evidence of mitral stenosis.  4. The aortic valve has an indeterminant number of cusps. Aortic valve regurgitation is not visualized. Mild to moderate aortic valve sclerosis/calcification is present, without any evidence of aortic stenosis.  5. Aortic dilatation noted. There is mild dilatation of the ascending aorta measuring 38 mm.  6. The inferior vena cava is normal in size with greater than 50% respiratory variability, suggesting right atrial pressure of 3 mmHg. Comparison(s): The left ventricular function has improved. Prior EF 20-25%. FINDINGS  Left Ventricle: Left ventricular  ejection fraction, by estimation, is 35 to 40%. The left ventricle has moderately decreased function. The left ventricle has no regional  wall motion abnormalities. The left ventricular internal cavity size was normal in size. There is mild left ventricular hypertrophy.  LV Wall Scoring: The mid and distal inferior wall and mid inferolateral segment are akinetic. Right Ventricle: The right ventricular size is normal. No increase in right ventricular wall thickness. Right ventricular systolic function is normal. Left Atrium: Left atrial size was normal in size. Right Atrium: Right atrial size was normal in size. Pericardium: There is no evidence of pericardial effusion. Mitral Valve: The mitral valve is normal in structure. Normal mobility of the mitral valve leaflets. Trivial mitral valve regurgitation. No evidence of mitral valve stenosis. Tricuspid Valve: The tricuspid valve is normal in structure. Tricuspid valve regurgitation is not demonstrated. No evidence of tricuspid stenosis. Aortic Valve: The aortic valve has an indeterminant number of cusps. . There is mild thickening and mild calcification of the aortic valve. Aortic valve regurgitation is not visualized. Mild to moderate aortic valve sclerosis/calcification is present, without any evidence of aortic stenosis. Mild aortic valve annular calcification. There is mild thickening of the aortic valve. There is mild calcification of the aortic valve. Aortic valve mean gradient measures 5.7 mmHg. Aortic valve peak gradient measures 10.5 mmHg. Aortic valve area, by VTI measures 1.63 cm. Pulmonic Valve: The pulmonic valve was normal in structure. Pulmonic valve regurgitation is not visualized. No evidence of pulmonic stenosis. Aorta: Aortic dilatation noted. There is mild dilatation of the ascending aorta measuring 38 mm. Venous: The inferior vena cava is normal in size with greater than 50% respiratory variability, suggesting right atrial pressure of 3 mmHg. IAS/Shunts: No atrial level shunt detected by color flow Doppler.  LEFT VENTRICLE PLAX 2D LVIDd:         5.44 cm      Diastology LVIDs:          4.59 cm      LV e' lateral:   6.15 cm/s LV PW:         1.26 cm      LV E/e' lateral: 14.9 LV IVS:        1.28 cm      LV e' medial:    3.98 cm/s LVOT diam:     2.30 cm      LV E/e' medial:  23.0 LV SV:         52 LV SV Index:   22 LVOT Area:     4.15 cm  LV Volumes (MOD) LV vol d, MOD A2C: 203.0 ml LV vol d, MOD A4C: 268.0 ml LV vol s, MOD A2C: 149.0 ml LV vol s, MOD A4C: 159.0 ml LV SV MOD A2C:     54.0 ml LV SV MOD A4C:     268.0 ml LV SV MOD BP:      85.9 ml RIGHT VENTRICLE RV S prime:     16.80 cm/s TAPSE (M-mode): 2.2 cm LEFT ATRIUM         Index LA diam:    4.90 cm 2.04 cm/m  AORTIC VALVE AV Area (Vmax):    1.71 cm AV Area (Vmean):   1.76 cm AV Area (VTI):     1.63 cm AV Vmax:           161.67 cm/s AV Vmean:          108.000 cm/s AV VTI:  0.319 m AV Peak Grad:      10.5 mmHg AV Mean Grad:      5.7 mmHg LVOT Vmax:         66.60 cm/s LVOT Vmean:        45.750 cm/s LVOT VTI:          0.125 m LVOT/AV VTI ratio: 0.39  AORTA Ao Root diam: 3.10 cm MITRAL VALVE MV Area (PHT): 4.30 cm    SHUNTS MV Decel Time: 177 msec    Systemic VTI:  0.12 m MV E velocity: 91.50 cm/s  Systemic Diam: 2.30 cm MV A velocity: 62.15 cm/s MV E/A ratio:  1.47 Candee Furbish MD Electronically signed by Candee Furbish MD Signature Date/Time: 03/12/2020/10:51:54 AM    Final (Updated)      I have independently reviewed the above radiologic studies and discussed with the patient   Recent Lab Findings: Lab Results  Component Value Date   WBC 13.4 (H) 03/13/2020   HGB 12.0 (L) 03/13/2020   HCT 37.5 (L) 03/13/2020   PLT 315 03/13/2020   GLUCOSE 229 (H) 03/13/2020   CHOL 157 03/08/2020   TRIG 144 03/08/2020   HDL 36 (L) 03/08/2020   LDLCALC 92 03/08/2020   ALT 30 03/13/2020   AST 29 03/13/2020   NA 136 03/13/2020   K 3.9 03/13/2020   CL 98 03/13/2020   CREATININE 1.10 03/13/2020   BUN 26 (H) 03/13/2020   CO2 27 03/13/2020   TSH 3.350 03/05/2020   INR 1.1 03/06/2020   HGBA1C 10.2 (H) 03/06/2020      Assessment  / Plan:      60 yo man with obesity, HTN, and DM now with multivessel CAD. He is a good candidate for CABG and in fact would benefit from multivessel CABG with multiple arterial grafting towards complete revascularization to prolong survival and reduce coronary events in the future. He understands the risks and benefits of the procedure and wishes to proceed.      I  spent 40 minutes counseling the patient face to face.   Reine Bristow Z. Orvan Seen, [place] 03/13/2020 7:06 AM

## 2020-03-13 NOTE — Op Note (Signed)
CARDIOTHORACIC SURGERY OPERATIVE NOTE  Date of Procedure: 03/13/2020  Preoperative Diagnosis: Severe 3-vessel Coronary Artery Disease and depressed LV function  Postoperative Diagnosis: Same  Procedure:    Coronary Artery Bypass Grafting x 5   Left Internal Mammary Artery to Distal Left Anterior Descending Coronary Artery; pedicled RIMA to right Posterolateral  Coronary Artery; left radial artery graft to 1st Obtuse Marginal Branch of Left Circumflex Coronary Artery, ramus intermedius coronary artery, and Diagonal Branch Coronary Artery as a sequenced graft; Open left radial artery Harvest; bilateral internal mammary artery harvesting Multilevel rib block with Exparel solution Completion graft surveillance with indocyanine green fluorescence imaging (SPY)  Surgeon: B. Murvin Natal, MD  Assistant: Macarthur Critchley, PA-C  Anesthesia: get  Operative Findings:  preserved left ventricular systolic function  good quality internal mammary artery conduits  goodquality radial artery conduit  good quality target vessels for grafting    BRIEF CLINICAL NOTE AND INDICATIONS FOR SURGERY  59 yo man with several risk factors for CAD presented with shortness of breath. He ruled in for NSTEMI. He underwent LHC showing severe 3V CAD. Referred for CABG. He has been thoroughly evaluated and is considered a good candidate for surgery.   DETAILS OF THE OPERATIVE PROCEDURE  Preparation:  The patient is brought to the operating room on the above mentioned date and central monitoring was established by the anesthesia team including placement of Swan-Ganz catheter and radial arterial line. The patient is placed in the supine position on the operating table.  Intravenous antibiotics are administered. General endotracheal anesthesia is induced uneventfully. A Foley catheter is placed.  Baseline transesophageal echocardiogram was performed.  Findings were notable for mildly reduced LV function and mild to  moderate mitral valve regurgitation.  The patient's chest, abdomen, both groins, left upper extremity and both lower extremities are prepared and draped in a sterile manner. A time out procedure is performed.   Surgical Approach and Conduit Harvest:  A median sternotomy incision was performed and the left internal mammary artery is dissected from the chest wall and prepared for bypass grafting. The left internal mammary artery is notably good quality conduit. Simultaneously, open harvest of the left radial artery is performed.  After removal of the radial artery, the surgical incisions in the left upper extremity are closed with absorbable suture and the arm is tucked at the side. Attention is turned to the right chest where the RIMA is mobilized in a standard fashion. Following systemic heparinization, both internal mammary arteries were transected distally and noted to have excellent flow. The grafts were treated with papaverine solution.    Extracorporeal Cardiopulmonary Bypass and Myocardial Protection:  The pericardium is opened. The ascending aorta is nondiseased in appearance. The ascending aorta and the right atrium are cannulated for cardiopulmonary bypass.  Adequate heparinization is verified.     Cardiopulmonary bypass was begun and the surface of the heart is inspected. Distal target vessels are selected for coronary artery bypass grafting. A cardioplegia cannula is placed in the ascending aorta.   The patient is allowed to cool passively to 34C systemic temperature.  The aortic cross clamp is applied and cold blood cardioplegia is delivered initially in an antegrade fashion through the aortic root. Iced saline slush is applied for topical hypothermia.  The initial cardioplegic arrest is rapid with early diastolic arrest.  Repeat doses of cardioplegia are administered intermittently throughout the entire cross clamp portion of the operation through the aortic root  and through  subsequently placed  grafts in order  to maintain completely flat electrocardiogram.   Coronary Artery Bypass Grafting:   The  posterolateral branch of the right coronary artery was grafted using the pedicled RIMA graft in an end-to-side fashion.  At the site of distal anastomosis the target vessel was fair quality and measured approximately 1.5 mm in diameter. Anastomotic patency and runoff was confirmed with indocyanine green fluorescence imaging (SPY).  The1st obtuse marginal branch of the left circumflex coronary artery was grafted using the left radial artery  graft in an end-to-side fashion.  At the site of distal anastomosis the target vessel was good quality and measured approximately 1.5 mm in diameter.Next, the same graft was used to bypass the ramus intermedius vessel in a side-to-side fashion to create a sequenced anastomosis. At the site of the distal anastomosis, the target was 1.5 mm diameter and good quality. Finally, the left radial graft was also anastomosed to the  diagonal branch of the left anterior descending coronary artery with another sequenced side-to-side fashion anastomosis.  At the site of distal anastomosis the target vessel was good quality and measured approximately 1.5 mm in diameter. Anastomotic patency and runoff was confirmed with indocyanine green fluorescence imaging (SPY).  The distal left anterior coronary artery was grafted with the left internal mammary artery in an end-to-side fashion.  At the site of distal anastomosis the target vessel was good quality and measured approximately 1.5 mm in diameter.Anastomotic patency and runoff was confirmed with indocyanine green fluorescence imaging (SPY).  The proximal radial artery graft anastomosis was placed directly to the ascending aorta prior to removal of the aortic cross clamp.  Deairing procedures were performed and the aortic cross clamp was removed.    Procedure Completion:  All proximal and distal coronary  anastomoses were inspected for hemostasis and appropriate graft orientation. Epicardial pacing wires are fixed to the right ventricular outflow tract and to the right atrial appendage. The patient is rewarmed to 37C temperature. The patient is weaned and disconnected from cardiopulmonary bypass.  The patient's rhythm at separation from bypass was NSR.  The patient was weaned from cardiopulmonary bypass without difficulty Followup transesophageal echocardiogram performed after separation from bypass revealedno changes from the preoperative exam.  The aortic and venous cannula were removed uneventfully. Protamine was administered to reverse the anticoagulation. The mediastinum and pleural space were inspected for hemostasis and irrigated with saline solution. The mediastinum and both pleural spaces were drained using fluted chest tubes placed through separate stab incisions inferiorly.  The soft tissues anterior to the aorta were reapproximated loosely. The sternum is closed with double strength sternal wire. The soft tissues anterior to the sternum were closed in multiple layers and the skin is closed with a running subcuticular skin closure.  The post-bypass portion of the operation was notable for stable rhythm and hemodynamics.  No blood products were administered during the operation.   Disposition:  The patient tolerated the procedure well and is transported to the surgical intensive care in stable condition. There are no intraoperative complications. All sponge instrument and needle counts are verified correct at completion of the operation.    Jayme Cloud MD 03/13/2020 3:47 PM

## 2020-03-13 NOTE — Brief Op Note (Signed)
03/05/2020 - 03/13/2020  12:14 PM  PATIENT:  Shane Chambers  59 y.o. male  PRE-OPERATIVE DIAGNOSIS:  CORONARY ARTERY DISEASE, CHF POST-OPERATIVE DIAGNOSIS:  CORONARY ARTERY DISEASE, CHF  PROCEDURES:  CORONARY ARTERY BYPASS GRAFTING (CABG) X 5 using bilateral Internal mammory arteries and left radial artery  RADIAL ARTERY HARVEST  LIMA->LAD LRA->D1->RI->OM1 RIMA->PLB of RCA  TRANSESOPHAGEAL ECHOCARDIOGRAM   SURGEON: [name], MD - Primary  PHYSICIAN ASSISTANT: Roddenberry   ANESTHESIA:   general  EBL:  Per perfusion and and anesthesia records   BLOOD ADMINISTERED:none  DRAINS: Bilateral pleural and mediastinal drains   LOCAL MEDICATIONS USED:  Intercostal Exparel bilaterally  SPECIMEN:  No Specimen  DISPOSITION OF SPECIMEN:  N/A  COUNTS:  YES  DICTATION: .Dragon Dictation  PLAN OF CARE: Admit to inpatient   PATIENT DISPOSITION:  ICU - intubated and hemodynamically stable.   Delay start of Pharmacological VTE agent (>24hrs) due to surgical blood loss or risk of bleeding: yes

## 2020-03-13 NOTE — Progress Notes (Signed)
OT Cancellation Note  Patient Details Name: Shane Chambers MRN: [id number] DOB: Jul 01, 1961   Cancelled Treatment:    Reason Eval/Treat Not Completed: Patient at procedure or test/ unavailable. Pt currently in surgery. OT will follow up at a later date.   Mauri Brooklyn 03/13/2020, 7:38 AM

## 2020-03-13 NOTE — Anesthesia Procedure Notes (Signed)
Procedure Name: Intubation Date/Time: 03/13/2020 7:55 AM Performed by: Leonor Liv, CRNA Pre-anesthesia Checklist: Patient identified, Emergency Drugs available, Suction available and Patient being monitored Patient Re-evaluated:Patient Re-evaluated prior to induction Oxygen Delivery Method: Circle System Utilized Preoxygenation: Pre-oxygenation with 100% oxygen Induction Type: IV induction and Rapid sequence Laryngoscope Size: Glidescope and 4 Grade View: Grade I Tube type: Oral Tube size: 7.5 mm Number of attempts: 1 Airway Equipment and Method: Stylet and Oral airway Placement Confirmation: ETT inserted through vocal cords under direct vision,  positive ETCO2 and breath sounds checked- equal and bilateral Secured at: 22 cm Tube secured with: Tape Dental Injury: Teeth and Oropharynx as per pre-operative assessment

## 2020-03-13 NOTE — Anesthesia Procedure Notes (Signed)
Central Venous Catheter Insertion Performed by: Roderic Palau, MD, anesthesiologist Start/End5/02/2020 6:30 AM, 03/13/2020 6:45 AM Patient location: Pre-op. Preanesthetic checklist: patient identified, IV checked, site marked, risks and benefits discussed, surgical consent, monitors and equipment checked, pre-op evaluation, timeout performed and anesthesia consent Position: Trendelenburg Lidocaine 1% used for infiltration and patient sedated Hand hygiene performed , maximum sterile barriers used  and Seldinger technique used Catheter size: 9 Fr Total catheter length 10. Central line was placed.MAC introducer Procedure performed using ultrasound guided technique. Ultrasound Notes:anatomy identified, needle tip was noted to be adjacent to the nerve/plexus identified, no ultrasound evidence of intravascular and/or intraneural injection and image(s) printed for medical record Attempts: 1 Following insertion, line sutured, dressing applied and Biopatch. Post procedure assessment: blood return through all ports, free fluid flow and no air  Patient tolerated the procedure well with no immediate complications.

## 2020-03-13 NOTE — Anesthesia Procedure Notes (Signed)
Arterial Line Insertion Start/End5/02/2020 7:00 AM, 03/13/2020 7:15 AM Performed by: Leonor Liv, CRNA, CRNA  Patient location: Pre-op. Preanesthetic checklist: patient identified, IV checked, site marked, risks and benefits discussed, surgical consent, monitors and equipment checked, pre-op evaluation, timeout performed and anesthesia consent Lidocaine 1% used for infiltration and patient sedated Right, radial was placed Catheter size: 20 G Hand hygiene performed  and maximum sterile barriers used  Allen's test indicative of satisfactory collateral circulation Attempts: 1 Procedure performed without using ultrasound guided technique. Following insertion, dressing applied and Biopatch. Post procedure assessment: normal  Patient tolerated the procedure well with no immediate complications.

## 2020-03-13 NOTE — Progress Notes (Signed)
Patient off the floor for cardiac surgery prior to 7 AM.  I was not able to see or evaluate the patient.  Postoperatively, she will be on cardiothoracic surgery service.  [facility] will be available for any consultation if necessary. Will sign off for now.  Gerlean Ren MD

## 2020-03-13 NOTE — Progress Notes (Signed)
      [address]       [place] [address]             [phone]      S/p CABG x 5  Intubated, weaning has started  BP 139/68   Pulse 90   Temp (!) 97.5 F (36.4 C)   Resp (!) 22   Ht 5\' 9"  (1.753 m)   Wt 129.5 kg   SpO2 97%   BMI 42.16 kg/m  Epi at 2 CO= 5.8   Intake/Output Summary (Last 24 hours) at 03/13/2020 1841 Last data filed at 03/13/2020 1800 Gross per 24 hour  Intake 3849.86 ml  Output 3925 ml  Net -75.14 ml   CT 190 since OR  Doing well early postop- wean vent as tolerated  Remo Lipps C. Roxan Hockey, MD [facility] [phone]

## 2020-03-13 NOTE — H&P (Signed)
History and Physical Interval Note:  03/13/2020 7:16 AM  Shane Chambers  has presented today for surgery, with the diagnosis of CAD CHF.  The various methods of treatment have been discussed with the patient and family. After consideration of risks, benefits and other options for treatment, the patient has consented to  Procedure(s): CORONARY ARTERY BYPASS GRAFTING (CABG) bilateral IMA (N/A) RADIAL ARTERY HARVEST, bilateral (Bilateral) TRANSESOPHAGEAL ECHOCARDIOGRAM (TEE) (N/A) as a surgical intervention.  The patient's history has been reviewed, patient examined, no change in status, stable for surgery.  I have reviewed the patient's chart and labs.  Questions were answered to the patient's satisfaction.     [name]

## 2020-03-13 NOTE — Transfer of Care (Signed)
Immediate Anesthesia Transfer of Care Note  Patient: Shane Chambers  Procedure(s) Performed: CORONARY ARTERY BYPASS GRAFTING (CABG) times five using bilateral Internal mammary arteries and left radial artery (N/A Chest) RADIAL ARTERY HARVEST, (Left Arm Lower) TRANSESOPHAGEAL ECHOCARDIOGRAM (TEE) (N/A )  Patient Location: SICU  Anesthesia Type:General  Level of Consciousness: sedated and Patient remains intubated per anesthesia plan  Airway & Oxygen Therapy: Patient remains intubated per anesthesia plan and Patient placed on Ventilator (see vital sign flow sheet for setting)  Post-op Assessment: Report given to RN and Post -op Vital signs reviewed and stable  Post vital signs: Reviewed and stable  Last Vitals:  Vitals Value Taken Time  BP 97/67 03/13/20 1400  Temp 36.4 C 03/13/20 1401  Pulse 80 03/13/20 1401  Resp 18 03/13/20 1401  SpO2 98 % 03/13/20 1401  Vitals shown include unvalidated device data.  Last Pain:  Vitals:   03/13/20 0530  TempSrc: Axillary  PainSc:       Patients Stated Pain Goal: 0 ([id number])  Complications: No apparent anesthesia complications

## 2020-03-13 NOTE — Procedures (Signed)
Extubation Procedure Note  Patient Details:   Name: Shane Chambers DOB: [birth year] MRN: [id number]   Airway Documentation:    Vent end date: 03/13/20 Vent end time: 1930   Evaluation  O2 sats: stable throughout Complications: No apparent complications Patient did tolerate procedure well. Bilateral Breath Sounds: Diminished, Clear   Yes   Patient achieved NIF -25 and FVC .950L. RT extubated patient to 4L . Patient's spo2 96%. Patient can verbalize name and did IS 64ml x 3.  Alvera Singh 03/13/2020, 7:39 PM

## 2020-03-14 ENCOUNTER — Inpatient Hospital Stay (HOSPITAL_COMMUNITY): Payer: Medicaid Other

## 2020-03-14 LAB — MAGNESIUM
Magnesium: 2.2 mg/dL (ref 1.7–2.4)
Magnesium: 2.2 mg/dL (ref 1.7–2.4)

## 2020-03-14 LAB — BASIC METABOLIC PANEL
Anion gap: 8 (ref 5–15)
Anion gap: 7 (ref 5–15)
BUN: 19 mg/dL (ref 6–20)
BUN: 22 mg/dL — ABNORMAL HIGH (ref 6–20)
CO2: 22 mmol/L (ref 22–32)
CO2: 23 mmol/L (ref 22–32)
Calcium: 8.7 mg/dL — ABNORMAL LOW (ref 8.9–10.3)
Calcium: 8.4 mg/dL — ABNORMAL LOW (ref 8.9–10.3)
Chloride: 106 mmol/L (ref 98–111)
Chloride: 105 mmol/L (ref 98–111)
Creatinine, Ser: 1.06 mg/dL (ref 0.61–1.24)
Creatinine, Ser: 1.2 mg/dL (ref 0.61–1.24)
GFR calc Af Amer: >60 mL/min (ref >60)
GFR calc Af Amer: >60 mL/min (ref >60)
GFR calc non Af Amer: >60 mL/min (ref >60)
GFR calc non Af Amer: >60 mL/min (ref >60)
Glucose, Bld: 142 mg/dL — ABNORMAL HIGH (ref 70–99)
Glucose, Bld: 127 mg/dL — ABNORMAL HIGH (ref 70–99)
Potassium: 4.8 mmol/L (ref 3.5–5.1)
Potassium: 4.9 mmol/L (ref 3.5–5.1)
Sodium: 136 mmol/L (ref 135–145)
Sodium: 135 mmol/L (ref 135–145)

## 2020-03-14 LAB — GLUCOSE, CAPILLARY
Glucose-Capillary: 111 mg/dL — ABNORMAL HIGH (ref 70–99)
Glucose-Capillary: 113 mg/dL — ABNORMAL HIGH (ref 70–99)
Glucose-Capillary: 120 mg/dL — ABNORMAL HIGH (ref 70–99)
Glucose-Capillary: 121 mg/dL — ABNORMAL HIGH (ref 70–99)
Glucose-Capillary: 126 mg/dL — ABNORMAL HIGH (ref 70–99)
Glucose-Capillary: 132 mg/dL — ABNORMAL HIGH (ref 70–99)
Glucose-Capillary: 138 mg/dL — ABNORMAL HIGH (ref 70–99)
Glucose-Capillary: 139 mg/dL — ABNORMAL HIGH (ref 70–99)
Glucose-Capillary: 139 mg/dL — ABNORMAL HIGH (ref 70–99)
Glucose-Capillary: 142 mg/dL — ABNORMAL HIGH (ref 70–99)
Glucose-Capillary: 149 mg/dL — ABNORMAL HIGH (ref 70–99)
Glucose-Capillary: 156 mg/dL — ABNORMAL HIGH (ref 70–99)
Glucose-Capillary: 167 mg/dL — ABNORMAL HIGH (ref 70–99)
Glucose-Capillary: 170 mg/dL — ABNORMAL HIGH (ref 70–99)
Glucose-Capillary: 88 mg/dL (ref 70–99)
Glucose-Capillary: 98 mg/dL (ref 70–99)

## 2020-03-14 LAB — CBC
HCT: 34.6 % — ABNORMAL LOW (ref 39.0–52.0)
HCT: 31.6 % — ABNORMAL LOW (ref 39.0–52.0)
Hemoglobin: 10.7 g/dL — ABNORMAL LOW (ref 13.0–17.0)
Hemoglobin: 9.8 g/dL — ABNORMAL LOW (ref 13.0–17.0)
MCH: 28.8 pg (ref 26.0–34.0)
MCH: 28.8 pg (ref 26.0–34.0)
MCHC: 30.9 g/dL (ref 30.0–36.0)
MCHC: 31 g/dL (ref 30.0–36.0)
MCV: 93 fL (ref 80.0–100.0)
MCV: 92.9 fL (ref 80.0–100.0)
Platelets: 268 10*3/uL (ref 150–400)
Platelets: 217 10*3/uL (ref 150–400)
RBC: 3.72 MIL/uL — ABNORMAL LOW (ref 4.22–5.81)
RBC: 3.4 MIL/uL — ABNORMAL LOW (ref 4.22–5.81)
RDW: 13.6 % (ref 11.5–15.5)
RDW: 13.8 % (ref 11.5–15.5)
WBC: 20.6 10*3/uL — ABNORMAL HIGH (ref 4.0–10.5)
WBC: 18 10*3/uL — ABNORMAL HIGH (ref 4.0–10.5)
nRBC: 0 % (ref 0.0–0.2)
nRBC: 0 % (ref 0.0–0.2)

## 2020-03-14 IMAGING — DX DG CHEST 1V PORT
1 series · 1 of 1 positions shown · non-contrast
Comparison: [DATE]

CLINICAL DATA: Evaluate chest tube placement. Status post coronary
artery bypass grafting.

EXAM:
PORTABLE CHEST 1 VIEW

[chest ap]
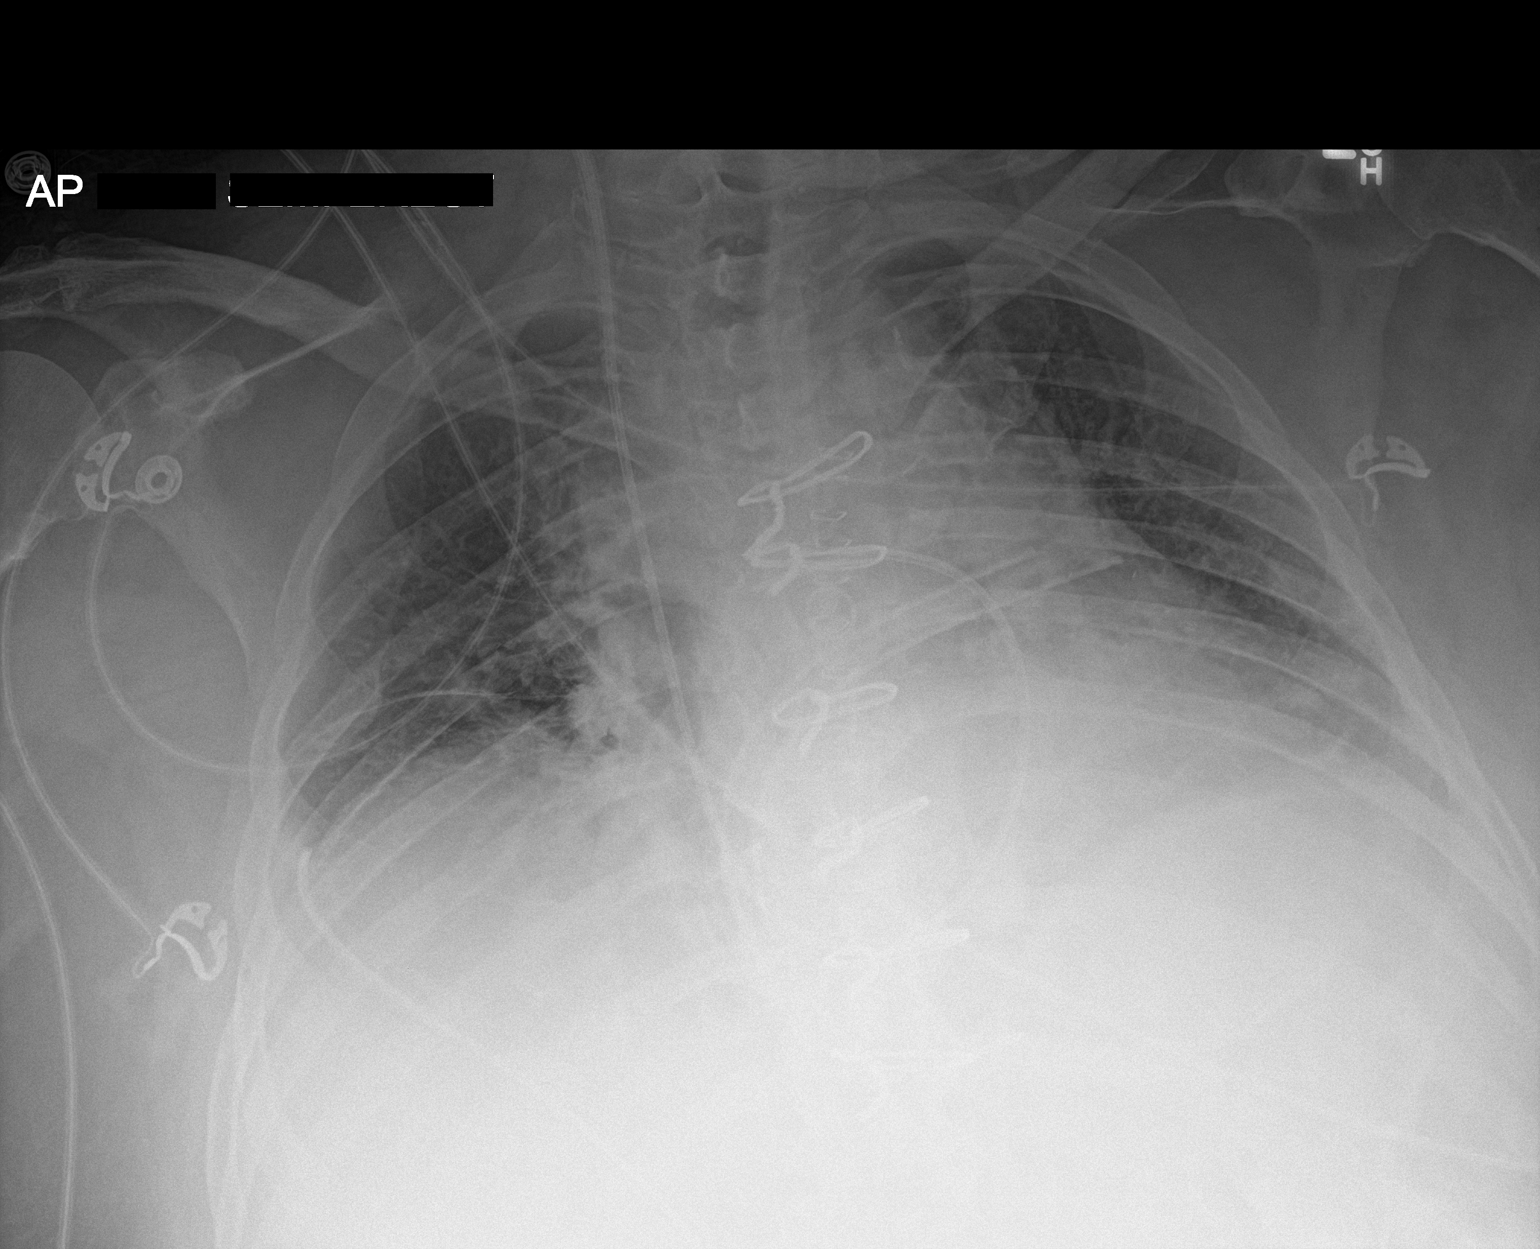

[1 of 1 positions shown; findings below may reference images not displayed]

FINDINGS: There is a right IJ catheter with tip in the expected location of
the main pulmonary artery. Right-sided chest tube is in place. No
pneumothorax. Stable cardiac enlargement. Diffuse low lung volumes.
Atelectasis is noted in the lung bases.
IMPRESSION: 1. No pneumothorax after right-sided chest tube placement.
2. Bibasilar atelectasis.
3. Stable cardiac enlargement.

## 2020-03-14 MED ORDER — KETOROLAC TROMETHAMINE 15 MG/ML IJ SOLN
7.5000 mg | Freq: Four times a day (QID) | INTRAMUSCULAR | Status: AC
  Administered 2020-03-14 – 2020-03-15 (×5): 7.5 mg via INTRAVENOUS
  Filled 2020-03-14 (×5): qty 1

## 2020-03-14 MED ORDER — ASPIRIN EC 81 MG PO TBEC
81.0000 mg | DELAYED_RELEASE_TABLET | Freq: Every day | ORAL | Status: DC
  Administered 2020-03-14 – 2020-03-19 (×6): 81 mg via ORAL
  Filled 2020-03-14 (×6): qty 1

## 2020-03-14 MED ORDER — THIAMINE HCL 100 MG/ML IJ SOLN
Freq: Once | INTRAVENOUS | Status: AC
  Filled 2020-03-14: qty 1000

## 2020-03-14 MED ORDER — CLOPIDOGREL BISULFATE 75 MG PO TABS
75.0000 mg | ORAL_TABLET | Freq: Every day | ORAL | Status: DC
  Administered 2020-03-14 – 2020-03-19 (×6): 75 mg via ORAL
  Filled 2020-03-14 (×6): qty 1

## 2020-03-14 MED ORDER — FUROSEMIDE 10 MG/ML IJ SOLN
40.0000 mg | Freq: Once | INTRAMUSCULAR | Status: AC
  Administered 2020-03-14: 40 mg via INTRAVENOUS
  Filled 2020-03-14: qty 4

## 2020-03-14 MED ORDER — LEVALBUTEROL HCL 0.63 MG/3ML IN NEBU
0.6300 mg | INHALATION_SOLUTION | Freq: Four times a day (QID) | RESPIRATORY_TRACT | Status: DC
  Administered 2020-03-14 – 2020-03-15 (×5): 0.63 mg via RESPIRATORY_TRACT
  Filled 2020-03-14 (×5): qty 3

## 2020-03-14 MED ORDER — CHLORHEXIDINE GLUCONATE 0.12 % MT SOLN
OROMUCOSAL | Status: AC
  Filled 2020-03-14: qty 15

## 2020-03-14 MED ORDER — ISOSORBIDE DINITRATE 10 MG PO TABS
10.0000 mg | ORAL_TABLET | Freq: Three times a day (TID) | ORAL | Status: DC
  Administered 2020-03-14 – 2020-03-19 (×16): 10 mg via ORAL
  Filled 2020-03-14 (×16): qty 1

## 2020-03-14 MED ORDER — SODIUM CHLORIDE 0.9% FLUSH: 10.0000 mL | INTRAVENOUS | Status: DC | PRN

## 2020-03-14 MED ORDER — SODIUM CHLORIDE 0.9% FLUSH
10.0000 mL | Freq: Two times a day (BID) | INTRAVENOUS | Status: DC
  Administered 2020-03-14 (×2): 10 mL

## 2020-03-14 NOTE — Discharge Instructions (Signed)

## 2020-03-14 NOTE — TOC Progression Note (Signed)
Transition of Care [facility]) - Progression Note    Patient Details  Name: Shane Chambers MRN: [id number] Date of Birth: Aug 22, 1961  [facility] CM/SW [place], [place] Phone Number: 03/14/2020, 5:15 PM  Clinical Narrative:     CSW followed up with [facility] on bed offer for patient. Bed offer is still good. CSW will follow up with patient in the morning with SNF choice.  CSW will continue to follow.    Expected Discharge Plan: Skilled Nursing Facility Barriers to Discharge: Continued Medical Work up  Expected Discharge Plan and Services Expected Discharge Plan: [place] In-house Referral: Clinical Social Work     Living arrangements for the past 2 months: Mobile Home                                       Social Determinants of Health (SDOH) Interventions    Readmission Risk Interventions Readmission Risk Prevention Plan 03/07/2020 12/06/2019  Transportation Screening Complete Complete  PCP or Specialist Appt within 3-5 Days - Not Complete  HRI or [place] - Not Complete  Social Work Consult for [place] Planning/Counseling - Not Complete  Palliative Care Screening - Not Complete  Medication Review Press photographer) Complete Complete  Some recent data might be hidden

## 2020-03-14 NOTE — Discharge Summary (Signed)
Physician Discharge Summary  Patient ID: Shane Chambers MRN: [id number] DOB/AGE: 1960/12/05 59 y.o.  Admit date: 03/05/2020 Discharge date: 03/19/2020  Admission Diagnoses:  Patient Active Problem List   Diagnosis Date Noted  . Ischemic cardiomyopathy   . Coronary artery disease   . Acute on chronic combined systolic and diastolic CHF (congestive heart failure) ([place]) 03/05/2020  . Acute hyponatremia 03/05/2020  . Acute respiratory failure with hypoxia ([place]) 12/21/2019  . Acute respiratory failure ([place]) 12/20/2019  . Severe Vitamin D deficiency 12/02/2019  . CAP (community acquired pneumonia) 12/01/2019  . Hypokalemia 12/01/2019  . Hyperlipidemia 12/01/2019  . BPH (benign prostatic hyperplasia) 12/01/2019  . Normocytic anemia 12/01/2019  . Pneumonia due to COVID-19 virus 12/01/2019  . Hypoxia   . SOB (shortness of breath) 11/16/2019  . Sepsis ([place]) 11/16/2019  . Hypertension   . Type 2 diabetes mellitus ([place])   . Depression   . Acute bronchitis with bronchospasm 11/15/2019   Discharge Diagnoses:   Patient Active Problem List   Diagnosis Date Noted  . 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 Ischemic cardiomyopathy   . Coronary artery disease   . Acute on chronic combined systolic and diastolic CHF (congestive heart failure) ([place]) 03/05/2020  . Acute hyponatremia 03/05/2020  . Acute respiratory failure with hypoxia ([place]) 12/21/2019  . Acute respiratory failure ([place]) 12/20/2019  . Severe Vitamin D deficiency 12/02/2019  . CAP (community acquired pneumonia) 12/01/2019  . Hypokalemia 12/01/2019  . Hyperlipidemia 12/01/2019  . BPH (benign prostatic hyperplasia) 12/01/2019  . Normocytic anemia 12/01/2019  . Pneumonia due to COVID-19 virus 12/01/2019  . Hypoxia   . SOB (shortness of breath) 11/16/2019  . Sepsis ([place]) 11/16/2019  . Hypertension   . Type 2 diabetes mellitus ([place])   . Depression   . Acute bronchitis with bronchospasm 11/15/2019    Discharged Condition:  good  History of Present Illness:   Shane Chambers is a 59 yo male with known history of HTN, DM, chronically elevated in Troponin.  The patient developed complaints of extreme shortness of breath last week.  He presented to [facility] for evaluation where he was ruled in for MI.  He was transferred to [facility] for further care.   Hospital Course:   He was taken for cardiac catheterization on 03/07/2020.  This showed severe CAD with a depressed LV function.  It was felt coronary bypass would be indicated and TCTS consult was requested.  The patient was evaluated by Dr. Orvan Seen who felt coronary bypass grafting would indicated.  The risks and benefits of the procedure were explained to the patient and he was agreeable to proceed.  He was taken to the operating room on 03/13/2020.  He underwent CABG x 5 utilizing LIMA to LAD, RIMA to right Posterolateral, left radial artery to OM 1, Ramus Intermediate, and diagonal.  He also underwent open radial artery harvest on the left.  He tolerated the procedure without difficulty and was taken to the SICU in stable condition.  He was extubated the evening of surgery.  During his stay in the SICU the patient was weaned off epinephrine as tolerated.  His chest tubes and arterial lines were removed without difficulty.  He was aggressively diuresed with IV Lasix.  He remains in NSR and was transferred to the progressive care unit on 03/15/2020.  The patient's CXR showed bilateral atelectasis.  He was started on a flutter valve for this.  His pacing wires were removed without difficulty.  He  was hypertensive and his Lopressor was increased and he was started on low dose Cozaar for better HR and BP control.  He is deconditioned, he lives with his mother who isn't able to provide care for him.  He has been evaluated by PT/OT who recommends SNF placement.  He remains stable in NSR.  His pacing wires have been removed without difficulty.  His surgical incisions are  healing without evidence of infection.  He does have staples in his sternotomy.  These can be removed on 04/02/2020.  He has bilateral paraesthesias in his hands.  His left hand should improve with time.  The right hand per the patient was present prior to admission.  This has gotten a little worse since surgery.  This should also return to baseline however if these symptoms persist we can refer for workup on an outpatient basis.   He is tolerating a carb modified diet.  He is medically stable for discharge to SNF today.  Significant Diagnostic Studies: angiography:    Prox RCA lesion is 99% stenosed.  Prox RCA to Mid RCA lesion is 40% stenosed.  Dist RCA lesion is 30% stenosed.  Prox LAD to Mid LAD lesion is 80% stenosed.  1st Diag lesion is 60% stenosed.  Mid LM to Dist LM lesion is 40% stenosed.  Prox Cx to Mid Cx lesion is 95% stenosed.   1. 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 Moderately elevated LV/right heart filling pressures c/w known diagnosis of heart failure 3. Known severe LV dysfunction by echo  Treatments: surgery:    Coronary Artery Bypass Grafting x 5              Left Internal Mammary Artery to Distal Left Anterior Descending Coronary Artery; pedicled RIMA to right Posterolateral  Coronary Artery; left radial artery graft to 1st Obtuse Marginal Branch of Left Circumflex Coronary Artery, ramus intermedius coronary artery, and Diagonal Branch Coronary Artery as a sequenced graft; Open left radial artery Harvest; bilateral internal mammary artery harvesting Multilevel rib block with Exparel solution Completion graft surveillance with indocyanine green fluorescence imaging (SPY)  Discharge Exam: Blood pressure 120/72, pulse 82, temperature 98.5 F (36.9 C), temperature source Oral, resp. rate 11, height 5\' 9"  (1.753 m), weight 124.7 kg,  SpO2 98 %.  General appearance: alert, cooperative and no distress Heart: regular rate and rhythm Lungs: clear to auscultation bilaterally Abdomen: soft, non-tender; bowel sounds normal; no masses,  no organomegaly Extremities: edema trace Wound: clean and dry, staples in place on sternotomy    Discharge Medications:  The patient has been discharged on:   1.Beta Blocker:  Yes [ X  ]                              No   [   ]                              If No, reason:  2.Ace Inhibitor/ARB: Yes [ X  ]                                     No  [    ]  If No, reason:  3.Statin:   Yes [ X  ]                  No  [   ]                  If No, reason:  4.Ecasa:  Yes  [ X  ]                  No   [   ]                  If No, reason:   Allergies as of 03/14/2020      Reactions   Ace Inhibitors Swelling, Cough   Other Itching   [name]   Penicillins    Has patient had a PCN reaction causing immediate rash, facial/tongue/throat swelling, SOB or lightheadedness with hypotension: Unknown Has patient had a PCN reaction causing severe rash involving mucus membranes or skin necrosis: Unknown Has patient had a PCN reaction that required hospitalization: Unknown Has patient had a PCN reaction occurring within the last 10 years: Unknown If all of the above answers are "NO", then may proceed with Cephalosporin use.   Allergies as of 03/19/2020      Reactions   Ace Inhibitors Swelling, Cough   Other Itching   [name]   Penicillins    Has patient had a PCN reaction causing immediate rash, facial/tongue/throat swelling, SOB or lightheadedness with hypotension: Unknown Has patient had a PCN reaction causing severe rash involving mucus membranes or skin necrosis: Unknown Has patient had a PCN reaction that required hospitalization: Unknown Has patient had a PCN reaction occurring within the last 10 years: Unknown If all of the above answers are "NO",  then may proceed with Cephalosporin use.      Medication List    STOP taking these medications   amLODipine 10 MG tablet Commonly known as: NORVASC   chlorthalidone 25 MG tablet Commonly known as: HYGROTON   CINNAMON PO   indomethacin 25 MG capsule Commonly known as: INDOCIN   metoprolol 200 MG 24 hr tablet Commonly known as: TOPROL-XL     TAKE these medications   albuterol 108 (90 Base) MCG/ACT inhaler Commonly known as: VENTOLIN HFA Inhale 1-2 puffs into the lungs every 6 (six) hours as needed for wheezing or shortness of breath.   aspirin 81 MG EC tablet Take 81 mg by mouth daily.   atorvastatin 80 MG tablet Commonly known as: LIPITOR Take 1 tablet (80 mg total) by mouth at bedtime. What changed:   medication strength  how much to take  when to take this   calcium carbonate 1250 (500 Ca) MG tablet Commonly known as: OS-CAL - dosed in mg of elemental calcium Take 1 tablet by mouth daily.   clopidogrel 75 MG tablet Commonly known as: PLAVIX Take 1 tablet (75 mg total) by mouth daily. Start taking on: Mar 20, 2020   docusate sodium 100 MG capsule Commonly known as: COLACE Take 1 capsule (100 mg total) by mouth 2 (two) times daily. What changed:   when to take this  reasons to take this   fluticasone 27.5 MCG/SPRAY nasal spray Commonly known as: VERAMYST Place 2 sprays into the nose daily as needed for rhinitis or allergies.   furosemide 20 MG tablet Commonly known as: Lasix Take 1 tablet (20 mg total) by mouth daily. Take an extra 20mg  by mouth if noted to have  greater than 3 lb weight gain in 24 hours. What changed:   when to take this  reasons to take this   gabapentin 300 MG capsule Commonly known as: NEURONTIN Take 900 mg by mouth 2 (two) times daily.   insulin aspart protamine- aspart (70-30) 100 UNIT/ML injection Commonly known as: NovoLOG Mix 70/30 Inject 0.55 mLs (55 Units total) into the skin 2 (two) times daily with a meal. What  changed: how much to take   Iron (Ferrous Sulfate) 325 (65 Fe) MG Tabs Take 1 tablet by mouth daily.   isosorbide dinitrate 10 MG tablet Commonly known as: ISORDIL Take 1 tablet (10 mg total) by mouth 3 (three) times daily. For 30 days   losartan 25 MG tablet Commonly known as: COZAAR Take 1 tablet (25 mg total) by mouth daily. Start taking on: Mar 20, 2020   MAGNESIUM PO Take 1 tablet by mouth daily.   metFORMIN 1000 MG tablet Commonly known as: GLUCOPHAGE Take 1,000 mg by mouth 2 (two) times daily with a meal.   metoprolol tartrate 25 MG tablet Commonly known as: LOPRESSOR Take 1 tablet (25 mg total) by mouth 2 (two) times daily.   omeprazole 40 MG capsule Commonly known as: PRILOSEC Take 40 mg by mouth daily.   polyethylene glycol 17 g packet Commonly known as: MIRALAX / GLYCOLAX Take 17 g by mouth daily as needed for mild constipation.   potassium chloride 10 MEQ tablet Commonly known as: KLOR-CON Take 1 tablet (10 mEq total) by mouth daily.   Tab-A-Vite Tabs Take 1 tablet by mouth daily.   tamsulosin 0.4 MG Caps capsule Commonly known as: FLOMAX Take 0.4 mg by mouth daily.   traMADol 50 MG tablet Commonly known as: ULTRAM Take 1-2 tablets (50-100 mg total) by mouth every 4 (four) hours as needed for moderate pain.   venlafaxine XR 150 MG 24 hr capsule Commonly known as: EFFEXOR-XR Take 150 mg by mouth daily with breakfast.            Contact information for follow-up providers    [name], MD Follow up on 03/26/2020.   Specialty: Cardiothoracic Surgery Why: appointment is at 4:30   Contact information: [place] [address] [phone]        Imogene Burn, PA-C Follow up on 04/02/2020.   Specialty: Cardiology Why: Appointment is at 12:30 Contact information: [place] [address] [phone]            Contact information for after-discharge care    [facility] .   Service: Skilled Nursing Contact information: [place] [phone]                  Signed: Ellwood Handler PA-C 03/19/2020, 3:44 PM

## 2020-03-14 NOTE — Addendum Note (Signed)
Addendum  created 03/14/20 [id number] by Josephine Igo, CRNA   Order list changed

## 2020-03-14 NOTE — Hospital Course (Signed)
After pre-operative evaluation, coronary artery bypass grafting was offered to the patient and he decided to proceed. He was taken to the OR on 03/13/20 where CABG x 5 was conducted using all arterial grafts. Following the procedure, he separated from cardiopulmonary bypass without difficulty and was transferred to the CVICU. He remained hemodynamically stable.  He was weaned from the ventilator on the evening of surgery and was extubated at around 7:30pm.  His respiratory status remained stable. He was weaned from the epinephrine infusion on the first post-op day. He was mobilized.

## 2020-03-14 NOTE — Progress Notes (Signed)
1 Day Post-Op Procedure(s) (LRB): CORONARY ARTERY BYPASS GRAFTING (CABG) times five using bilateral Internal mammary arteries and left radial artery (N/A) RADIAL ARTERY HARVEST, (Left) TRANSESOPHAGEAL ECHOCARDIOGRAM (TEE) (N/A) Subjective: No complaints  Objective: Vital signs in last 24 hours: Temp:  [97.3 F (36.3 C)-98.6 F (37 C)] 98.1 F (36.7 C) (05/05 0700) Pulse Rate:  [80-90] 88 (05/05 0700) Cardiac Rhythm: Atrial paced (05/05 0400) Resp:  [16-38] 16 (05/05 0700) BP: (77-139)/(57-71) 97/71 (05/04 1900) SpO2:  [94 %-100 %] 96 % (05/05 0700) Arterial Line BP: (90-181)/(48-87) 146/56 (05/05 0700) FiO2 (%):  [40 %-80 %] 40 % (05/04 1850) Weight:  [134.7 kg] 134.7 kg (05/05 0430)  Hemodynamic parameters for last 24 hours: PAP: (27-41)/(14-27) 34/23 CO:  [4.8 L/min-7.5 L/min] 7.5 L/min CI:  [2 L/min/m2-3.1 L/min/m2] 3.1 L/min/m2  Intake/Output from previous day: 05/04 0701 - 05/05 0700 In: 4664.4 [I.V.:3632.1; Blood:500; IV Piggyback:532.3] Out: [id number] [Urine:2670; Blood:435; Chest Tube:510] Intake/Output this shift: No intake/output data recorded.  General appearance: alert and cooperative Neurologic: intact Heart: regular rate and rhythm, S1, S2 normal, no murmur, click, rub or gallop Lungs: clear to auscultation bilaterally Abdomen: soft, non-tender; bowel sounds normal; no masses,  no organomegaly Extremities: extremities normal, atraumatic, no cyanosis or edema Wound: dressed, dry  Lab Results: Recent Labs    03/13/20 2016 03/14/20 0426  WBC 22.1* 20.6*  HGB 11.1* 10.7*  HCT 35.5* 34.6*  PLT 280 268   BMET:  Recent Labs    03/13/20 1922 03/13/20 1922 03/13/20 2011 03/14/20 0426  NA 137   < > 138 136  K 5.0   < > 4.6 4.8  CL 105  --   --  106  CO2 22  --   --  22  GLUCOSE 138*  --   --  142*  BUN 19  --   --  19  CREATININE 0.93  --   --  1.06  CALCIUM 8.7*  --   --  8.7*   < > = values in this interval not displayed.    PT/INR:  Recent Labs     03/13/20 1400  LABPROT 15.4*  INR 1.3*   ABG    Component Value Date/Time   PHART 7.356 03/13/2020 2011   HCO3 22.8 03/13/2020 2011   TCO2 24 03/13/2020 2011   ACIDBASEDEF 3.0 (H) 03/13/2020 2011   O2SAT 93.0 03/13/2020 2011   CBG (last 3)  Recent Labs    03/14/20 0418 03/14/20 0520 03/14/20 0746  GLUCAP 132* 138* 120*    Assessment/Plan: S/P Procedure(s) (LRB): CORONARY ARTERY BYPASS GRAFTING (CABG) times five using bilateral Internal mammary arteries and left radial artery (N/A) RADIAL ARTERY HARVEST, (Left) TRANSESOPHAGEAL ECHOCARDIOGRAM (TEE) (N/A) Mobilize continue to resuscitate  Wean off epi for cardiac index greater than 2 oob to chair pulm toilet Keep insulin drip    LOS: 9 days    Shane Chambers 03/14/2020

## 2020-03-15 ENCOUNTER — Inpatient Hospital Stay (HOSPITAL_COMMUNITY): Payer: Medicaid Other

## 2020-03-15 LAB — BASIC METABOLIC PANEL
Anion gap: 7 (ref 5–15)
BUN: 24 mg/dL — ABNORMAL HIGH (ref 6–20)
CO2: 24 mmol/L (ref 22–32)
Calcium: 8.5 mg/dL — ABNORMAL LOW (ref 8.9–10.3)
Chloride: 104 mmol/L (ref 98–111)
Creatinine, Ser: 1.34 mg/dL — ABNORMAL HIGH (ref 0.61–1.24)
GFR calc Af Amer: >60 mL/min (ref >60)
GFR calc non Af Amer: 58 mL/min — ABNORMAL LOW (ref >60)
Glucose, Bld: 129 mg/dL — ABNORMAL HIGH (ref 70–99)
Potassium: 4.9 mmol/L (ref 3.5–5.1)
Sodium: 135 mmol/L (ref 135–145)

## 2020-03-15 LAB — GLUCOSE, CAPILLARY
Glucose-Capillary: 105 mg/dL — ABNORMAL HIGH (ref 70–99)
Glucose-Capillary: 117 mg/dL — ABNORMAL HIGH (ref 70–99)
Glucose-Capillary: 120 mg/dL — ABNORMAL HIGH (ref 70–99)
Glucose-Capillary: 132 mg/dL — ABNORMAL HIGH (ref 70–99)
Glucose-Capillary: 151 mg/dL — ABNORMAL HIGH (ref 70–99)
Glucose-Capillary: 170 mg/dL — ABNORMAL HIGH (ref 70–99)
Glucose-Capillary: 191 mg/dL — ABNORMAL HIGH (ref 70–99)
Glucose-Capillary: 192 mg/dL — ABNORMAL HIGH (ref 70–99)
Glucose-Capillary: 93 mg/dL (ref 70–99)
Glucose-Capillary: 98 mg/dL (ref 70–99)

## 2020-03-15 LAB — CBC
HCT: 30.8 % — ABNORMAL LOW (ref 39.0–52.0)
Hemoglobin: 9.3 g/dL — ABNORMAL LOW (ref 13.0–17.0)
MCH: 28.9 pg (ref 26.0–34.0)
MCHC: 30.2 g/dL (ref 30.0–36.0)
MCV: 95.7 fL (ref 80.0–100.0)
Platelets: 210 10*3/uL (ref 150–400)
RBC: 3.22 MIL/uL — ABNORMAL LOW (ref 4.22–5.81)
RDW: 13.6 % (ref 11.5–15.5)
WBC: 18.2 10*3/uL — ABNORMAL HIGH (ref 4.0–10.5)
nRBC: 0 % (ref 0.0–0.2)

## 2020-03-15 IMAGING — DX DG CHEST 1V
1 series · 1 of 1 positions shown · non-contrast
Comparison: [DATE].

CLINICAL DATA: Status post cardiac surgery.

EXAM:
CHEST  1 VIEW

[chest]
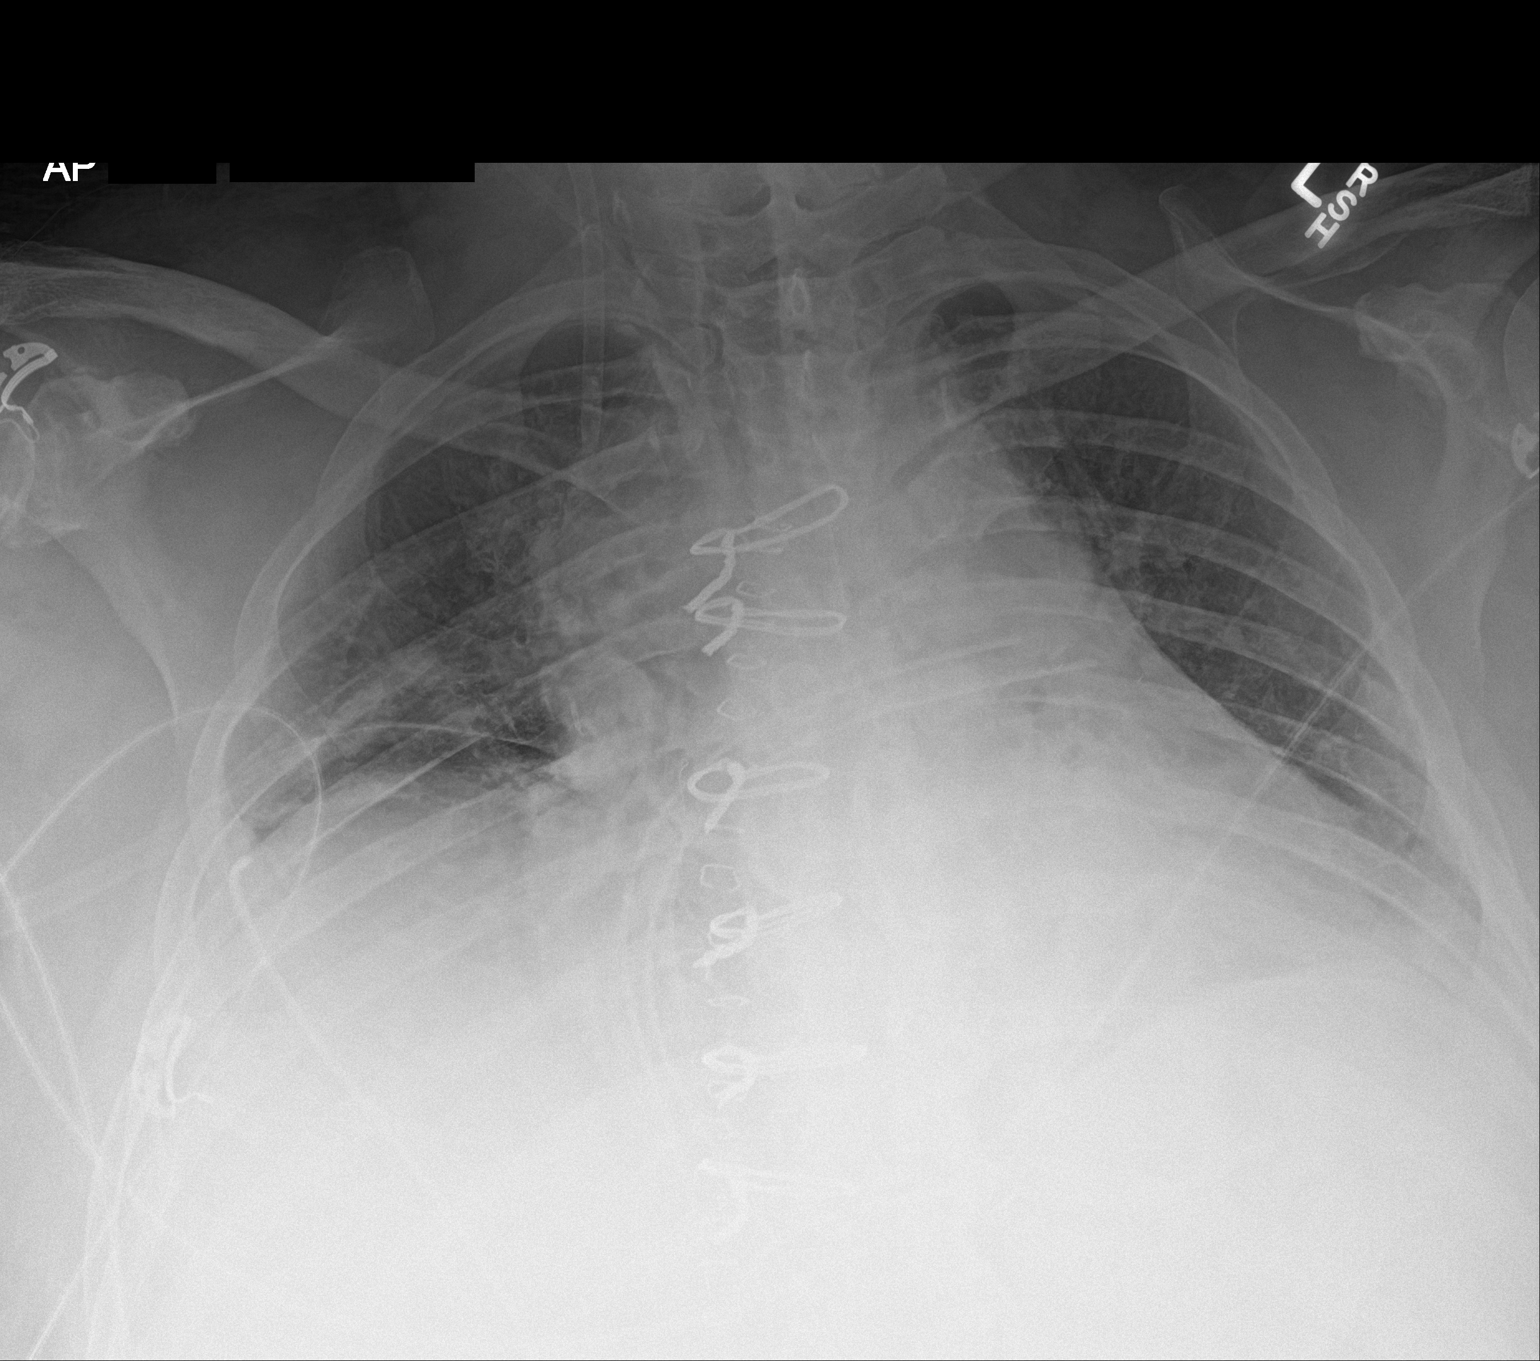

[1 of 1 positions shown; findings below may reference images not displayed]

FINDINGS: Stable cardiomegaly. No pneumothorax is noted. Hypoinflation of the
lungs is noted with mild bibasilar subsegmental atelectasis.
Right-sided chest tube is unchanged in position. Bony thorax is
unremarkable.
IMPRESSION: Hypoinflation of the lungs with mild bibasilar subsegmental
atelectasis. Right-sided chest tube is unchanged in position. No
pneumothorax is noted.

## 2020-03-15 MED ORDER — METOCLOPRAMIDE HCL 5 MG/ML IJ SOLN
10.0000 mg | Freq: Four times a day (QID) | INTRAMUSCULAR | Status: DC | PRN
  Administered 2020-03-15 – 2020-03-16 (×3): 10 mg via INTRAVENOUS
  Filled 2020-03-15 (×3): qty 2

## 2020-03-15 MED ORDER — LEVALBUTEROL HCL 0.63 MG/3ML IN NEBU: 0.6300 mg | INHALATION_SOLUTION | Freq: Four times a day (QID) | RESPIRATORY_TRACT | Status: DC | PRN

## 2020-03-15 MED ORDER — FUROSEMIDE 10 MG/ML IJ SOLN
60.0000 mg | Freq: Two times a day (BID) | INTRAMUSCULAR | Status: DC
  Administered 2020-03-15 – 2020-03-17 (×5): 60 mg via INTRAVENOUS
  Filled 2020-03-15 (×5): qty 6

## 2020-03-15 MED ORDER — MORPHINE SULFATE (PF) 4 MG/ML IV SOLN
4.0000 mg | Freq: Once | INTRAVENOUS | Status: AC
  Administered 2020-03-15: 4 mg via INTRAVENOUS
  Filled 2020-03-15: qty 1

## 2020-03-15 MED ORDER — INSULIN ASPART 100 UNIT/ML ~~LOC~~ SOLN
0.0000 [IU] | SUBCUTANEOUS | Status: DC
  Administered 2020-03-15: 2 [IU] via SUBCUTANEOUS
  Administered 2020-03-15: 4 [IU] via SUBCUTANEOUS
  Administered 2020-03-15: 2 [IU] via SUBCUTANEOUS
  Administered 2020-03-15 – 2020-03-16 (×3): 4 [IU] via SUBCUTANEOUS

## 2020-03-15 NOTE — Progress Notes (Signed)
Spoke with RN who sts pt has only transferred to chair and needs verbal instruction to move his feet. Pt needs PT/OT orders. Will f/u for progression of ambulation (into hallway). Yves Dill CES, ACSM 1:49 PM 03/15/2020

## 2020-03-15 NOTE — Progress Notes (Signed)
2 Days Post-Op Procedure(s) (LRB): CORONARY ARTERY BYPASS GRAFTING (CABG) times five using bilateral Internal mammary arteries and left radial artery (N/A) RADIAL ARTERY HARVEST, (Left) TRANSESOPHAGEAL ECHOCARDIOGRAM (TEE) (N/A) Subjective: Feeling nauseated Objective: Vital signs in last 24 hours: Temp:  [96.6 F (35.9 C)-98.2 F (36.8 C)] 97.7 F (36.5 C) (05/05 2300) Pulse Rate:  [89-93] 91 (05/06 0700) Cardiac Rhythm: Atrial paced (05/06 0400) Resp:  [11-32] 14 (05/06 0700) SpO2:  [90 %-100 %] 95 % (05/06 0700) Arterial Line BP: (102-156)/(49-64) 123/56 (05/06 0700) Weight:  [134.1 kg] (P) 134.1 kg (05/06 0653)  Hemodynamic parameters for last 24 hours: PAP: (31-40)/(16-30) 40/28 CO:  [6.6 L/min] 6.6 L/min CI:  [2.7 L/min/m2] 2.7 L/min/m2  Intake/Output from previous day: 05/05 0701 - 05/06 0700 In: 1664.6 [P.O.:240; I.V.:1314.5; IV Piggyback:110.1] Out: 1129 [Urine:759; Chest Tube:370] Intake/Output this shift: No intake/output data recorded.  General appearance: alert and cooperative Neurologic: intact Heart: regular rate and rhythm, S1, S2 normal, no murmur, click, rub or gallop Lungs: clear to auscultation bilaterally Abdomen: soft, non-tender; bowel sounds normal; no masses,  no organomegaly Extremities: edema 2+ Wound: dressed, dry  Lab Results: Recent Labs    03/14/20 1722 03/15/20 0449  WBC 18.0* 18.2*  HGB 9.8* 9.3*  HCT 31.6* 30.8*  PLT 217 210   BMET:  Recent Labs    03/14/20 1722 03/15/20 0449  NA 135 135  K 4.9 4.9  CL 105 104  CO2 23 24  GLUCOSE 127* 129*  BUN 22* 24*  CREATININE 1.20 1.34*  CALCIUM 8.4* 8.5*    PT/INR:  Recent Labs    03/13/20 1400  LABPROT 15.4*  INR 1.3*   ABG    Component Value Date/Time   PHART 7.356 03/13/2020 2011   HCO3 22.8 03/13/2020 2011   TCO2 24 03/13/2020 2011   ACIDBASEDEF 3.0 (H) 03/13/2020 2011   O2SAT 93.0 03/13/2020 2011   CBG (last 3)  Recent Labs    03/15/20 0128 03/15/20 0221  03/15/20 0447  GLUCAP 93 98 117*    Assessment/Plan: S/P Procedure(s) (LRB): CORONARY ARTERY BYPASS GRAFTING (CABG) times five using bilateral Internal mammary arteries and left radial artery (N/A) RADIAL ARTERY HARVEST, (Left) TRANSESOPHAGEAL ECHOCARDIOGRAM (TEE) (N/A) Mobilize Diuresis See progression orders   LOS: 10 days    Shane Chambers 03/15/2020

## 2020-03-15 NOTE — TOC Progression Note (Signed)
Transition of Care [facility]) - Progression Note    Patient Details  Name: LONDYN CODAY MRN: [id number] Date of Birth: 02/07/1961  Transition of Care [facility]) CM/SW [place], [place] Phone Number: 03/15/2020, 11:25 AM  Clinical Narrative:     CSW spoke with patient at bedside and offered SNF choice. Patient was agreeable to SNF choice.  Patient has bed at [facility] when medically ready for discharge.  Expected Discharge Plan: Skilled Nursing Facility Barriers to Discharge: Continued Medical Work up  Expected Discharge Plan and Services Expected Discharge Plan: [place] In-house Referral: Clinical Social Work     Living arrangements for the past 2 months: Mobile Home                                       Social Determinants of Health (SDOH) Interventions    Readmission Risk Interventions Readmission Risk Prevention Plan 03/07/2020 12/06/2019  Transportation Screening Complete Complete  PCP or Specialist Appt within 3-5 Days - Not Complete  HRI or [place] - Not Complete  Social Work Consult for [place] Planning/Counseling - Not Complete  Palliative Care Screening - Not Complete  Medication Review Press photographer) Complete Complete  Some recent data might be hidden

## 2020-03-16 ENCOUNTER — Inpatient Hospital Stay (HOSPITAL_COMMUNITY): Payer: Medicaid Other

## 2020-03-16 LAB — BASIC METABOLIC PANEL
Anion gap: 9 (ref 5–15)
BUN: 19 mg/dL (ref 6–20)
CO2: 25 mmol/L (ref 22–32)
Calcium: 8.5 mg/dL — ABNORMAL LOW (ref 8.9–10.3)
Chloride: 101 mmol/L (ref 98–111)
Creatinine, Ser: 1.03 mg/dL (ref 0.61–1.24)
GFR calc Af Amer: >60 mL/min (ref >60)
GFR calc non Af Amer: >60 mL/min (ref >60)
Glucose, Bld: 175 mg/dL — ABNORMAL HIGH (ref 70–99)
Potassium: 3.9 mmol/L (ref 3.5–5.1)
Sodium: 135 mmol/L (ref 135–145)

## 2020-03-16 LAB — GLUCOSE, CAPILLARY
Glucose-Capillary: 185 mg/dL — ABNORMAL HIGH (ref 70–99)
Glucose-Capillary: 184 mg/dL — ABNORMAL HIGH (ref 70–99)
Glucose-Capillary: 216 mg/dL — ABNORMAL HIGH (ref 70–99)
Glucose-Capillary: 220 mg/dL — ABNORMAL HIGH (ref 70–99)
Glucose-Capillary: 256 mg/dL — ABNORMAL HIGH (ref 70–99)

## 2020-03-16 LAB — CBC
HCT: 32.5 % — ABNORMAL LOW (ref 39.0–52.0)
Hemoglobin: 9.9 g/dL — ABNORMAL LOW (ref 13.0–17.0)
MCH: 28.3 pg (ref 26.0–34.0)
MCHC: 30.5 g/dL (ref 30.0–36.0)
MCV: 92.9 fL (ref 80.0–100.0)
Platelets: 238 10*3/uL (ref 150–400)
RBC: 3.5 MIL/uL — ABNORMAL LOW (ref 4.22–5.81)
RDW: 13.6 % (ref 11.5–15.5)
WBC: 15.6 10*3/uL — ABNORMAL HIGH (ref 4.0–10.5)
nRBC: 0.1 % (ref 0.0–0.2)

## 2020-03-16 IMAGING — DX DG CHEST 1V PORT
1 series · 1 of 1 positions shown · non-contrast
Comparison: [DATE].

CLINICAL DATA: Chest tube.

EXAM:
PORTABLE CHEST 1 VIEW

[chest ap]
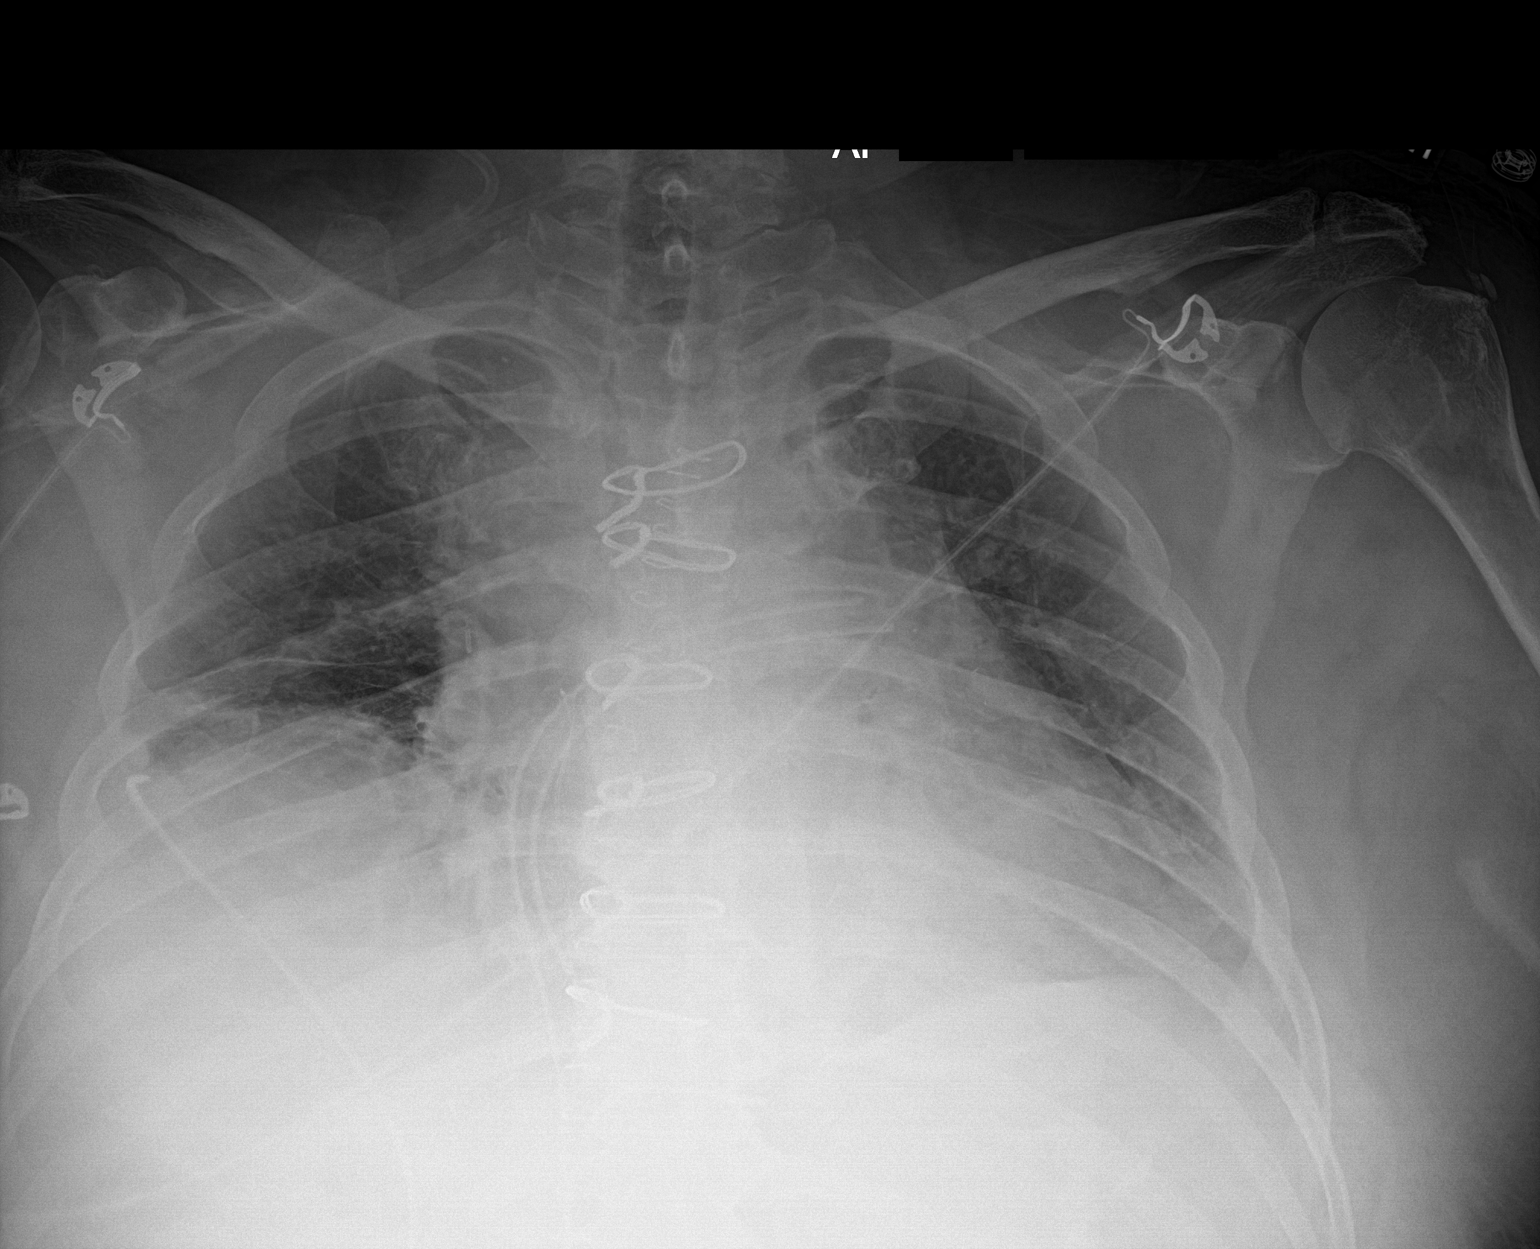

[1 of 1 positions shown; findings below may reference images not displayed]

FINDINGS: Stable cardiomegaly. Sternotomy wires are noted. No pneumothorax is
noted. Right-sided chest tube is noted without pneumothorax. Stable
elevated right hemidiaphragm with mild right basilar subsegmental
atelectasis. Bony thorax is unremarkable.
IMPRESSION: Right-sided chest tube is noted without pneumothorax. Stable
elevated right hemidiaphragm with mild right basilar subsegmental
atelectasis.

## 2020-03-16 MED ORDER — METOPROLOL TARTRATE 25 MG PO TABS
25.0000 mg | ORAL_TABLET | Freq: Two times a day (BID) | ORAL | Status: DC
  Administered 2020-03-16 – 2020-03-19 (×7): 25 mg via ORAL
  Filled 2020-03-16 (×7): qty 1

## 2020-03-16 MED ORDER — METOPROLOL TARTRATE 25 MG/10 ML ORAL SUSPENSION
25.0000 mg | Freq: Two times a day (BID) | ORAL | Status: DC
  Filled 2020-03-16: qty 10

## 2020-03-16 MED ORDER — INSULIN ASPART 100 UNIT/ML ~~LOC~~ SOLN
0.0000 [IU] | Freq: Three times a day (TID) | SUBCUTANEOUS | Status: DC
  Administered 2020-03-16 (×2): 8 [IU] via SUBCUTANEOUS
  Administered 2020-03-17: 12 [IU] via SUBCUTANEOUS
  Administered 2020-03-17: 4 [IU] via SUBCUTANEOUS
  Administered 2020-03-17: 12 [IU] via SUBCUTANEOUS
  Administered 2020-03-18 – 2020-03-19 (×5): 8 [IU] via SUBCUTANEOUS
  Administered 2020-03-19: 4 [IU] via SUBCUTANEOUS

## 2020-03-16 NOTE — Progress Notes (Signed)
D/c post. and anterior mediastinal, left and right pleural chest tube per order. Pt tolerated procedure well. Removed pacer wires per order. Pt in sinus rhythm, vitals  WDLs. Bed rest for an hour. Will keep monitoring pt.

## 2020-03-16 NOTE — Progress Notes (Signed)
Pt c/o that he was hearing "buzzing voices that wanted to kill him." Pt was reminded he was in a safe environment.

## 2020-03-16 NOTE — Progress Notes (Signed)
Pt was found trying to pull himself up in the bed. He was reminded about sternal precautions and re-education.

## 2020-03-16 NOTE — Progress Notes (Signed)
Pt educated on what sternal precaution are. He stated he understood.

## 2020-03-16 NOTE — Progress Notes (Signed)
      [address]       [place] [address]             [phone]      3 Days Post-Op Procedure(s) (LRB): CORONARY ARTERY BYPASS GRAFTING (CABG) times five using bilateral Internal mammary arteries and left radial artery (N/A) RADIAL ARTERY HARVEST, (Left) TRANSESOPHAGEAL ECHOCARDIOGRAM (TEE) (N/A)   Subjective:  Patient feels horrible.  Doesn't feel like he is doing well.  Has pain and is just overall uncomfortable  Objective: Vital signs in last 24 hours: Temp:  [98.1 F (36.7 C)-98.7 F (37.1 C)] 98.6 F (37 C) (05/07 0405) Pulse Rate:  [87-93] 91 (05/07 0405) Cardiac Rhythm: Normal sinus rhythm (05/07 0726) Resp:  [14-36] 21 (05/07 0405) BP: (110-145)/(57-89) 145/89 (05/07 0405) SpO2:  [92 %-100 %] 100 % (05/07 0405) Arterial Line BP: (145)/(52) 145/52 (05/06 0800) Weight:  [128.9 kg] 128.9 kg (05/07 0408)  Intake/Output from previous day: 05/06 0701 - 05/07 0700 In: 23.1 [I.V.:23.1] Out: 4560 [Urine:4300; Chest Tube:260]  General appearance: alert, cooperative and no distress Heart: regular rate and rhythm Lungs: diminished breath sounds bibasilar Abdomen: soft, non-tender; bowel sounds normal; no masses,  no organomegaly Extremities: trace Wound: clean and dry, no parasthesias at radial harvest  Lab Results: Recent Labs    03/15/20 0449 03/16/20 0239  WBC 18.2* 15.6*  HGB 9.3* 9.9*  HCT 30.8* 32.5*  PLT 210 238   BMET:  Recent Labs    03/15/20 0449 03/16/20 0239  NA 135 135  K 4.9 3.9  CL 104 101  CO2 24 25  GLUCOSE 129* 175*  BUN 24* 19  CREATININE 1.34* 1.03  CALCIUM 8.5* 8.5*    PT/INR:  Recent Labs    03/13/20 1400  LABPROT 15.4*  INR 1.3*   ABG    Component Value Date/Time   PHART 7.356 03/13/2020 2011   HCO3 22.8 03/13/2020 2011   TCO2 24 03/13/2020 2011   ACIDBASEDEF 3.0 (H) 03/13/2020 2011   O2SAT 93.0 03/13/2020 2011   CBG (last 3)  Recent Labs    03/15/20 2028 03/15/20 2335 03/16/20 0415    GLUCAP 192* 170* 185*    Assessment/Plan: S/P Procedure(s) (LRB): CORONARY ARTERY BYPASS GRAFTING (CABG) times five using bilateral Internal mammary arteries and left radial artery (N/A) RADIAL ARTERY HARVEST, (Left) TRANSESOPHAGEAL ECHOCARDIOGRAM (TEE) (N/A)  1. CV- NSR, BP elevated- increase Lopressor to 25 mg BID.Marland Kitchen can start cozaar tomorrow if Bp remains elevated 2.  Pulm- atelectasis on CXR, CT output 260 cc yesterday- will d/c chest tubes today, add flutter valve 3. Renal- creatinine WNL, weight is trending down, will give IV lasix today, transition to oral Lasix tomorrow at which point we can d/c foley 4. Expected post operative blood loss anemia, stable 9.9 5. Deconditioning- PT/OT placed, for SNF at discharge, this is arranged 6. DM- sugars mostly controlled, continue SSIP for now 7. Dispo- patient stable, increase Lopressor for better HR/BP control, can add Cozaar if BP remains elevated, d/c chest tubes today, continue diuretics, PT/OT   LOS: 11 days    Ellwood Handler, PA-C  03/16/2020

## 2020-03-16 NOTE — Evaluation (Signed)
Physical Therapy Evaluation Patient Details Name: Shane Chambers MRN: [id number] DOB: 04-11-1961 Today's Date: 03/16/2020   History of Present Illness  59 year old with history of DM2, peripheral neuropathy, HTN, BPH, presented to [facility] with acute diastolic CHF, elevated troponin.  Seen by cardiology and transferred to [facility] for further management.  LHC 4/28 performed showed severe multivessel CAD with elevated filling pressures. Pt underwent CABG 03/13/20.    Clinical Impression  Pt admitted with above diagnosis. Pt from home with family, ambulated household distances with [facility]. Previous PT eval completed 4/30 after initial admission. Pt orders were discharge at some point after that eval. Pt underwent CABG 5/4 with new PT orders placed 5/7. For 5/7 PT eval, pt required mod assist bed mobility and mod assist lateral scoot transfer bed to recliner. Mobility limited by pain, fatigue and pt's increased fear of falling. He demonstrates deficits in strength, balance and activity tolerance. He required continual verbal cues during session to maintain sternal precautions. Pt currently with functional limitations due to the deficits listed below (see PT Problem List). Pt will benefit from skilled PT to increase their independence and safety with mobility to allow discharge to the venue listed below.       Follow Up Recommendations SNF;Supervision/Assistance - 24 hour    Equipment Recommendations  None recommended by PT(defer to next venue)    Recommendations for Other Services       Precautions / Restrictions Precautions Precautions: Fall;Other (comment);Sternal Precaution Comments: wound vac, chest tube x 2, foley Restrictions [name] Weight Bearing: Partial weight bearing [name] Partial Weight Bearing Percentage or Pounds: Sternal Precautions LUE Weight Bearing: Partial weight bearing LUE Partial Weight Bearing Percentage or Pounds: Sternal Precautions Other  Position/Activity Restrictions: sternal precautions      Mobility  Bed Mobility Overal bed mobility: Needs Assistance Bed Mobility: Supine to Sit     Supine to sit: Mod assist;HOB elevated     General bed mobility comments: assist for BLE and to elevate trunk, continual cues to maintain sternal precautions, increased time  Transfers Overall transfer level: Needs assistance   Transfers: Lateral/Scoot Transfers          Lateral/Scoot Transfers: Mod assist;+2 safety/equipment General transfer comment: Pt anxious and fearful of falling. Unwilling to attempt sit to stand. Agreeable to scoot transfer bed to recliner. Continual cues for sternal precautions.  Ambulation/Gait                Stairs            Wheelchair Mobility    Modified Rankin (Stroke Patients Only)       Balance                                             Pertinent Vitals/Pain Pain Assessment: Faces Faces Pain Scale: Hurts even more Pain Location: sternal incision, L radial nerve harvest site Pain Descriptors / Indicators: Grimacing;Guarding;Discomfort;Moaning Pain Intervention(s): Repositioned;Monitored during session;Limited activity within patient's tolerance    Home Living Family/patient expects to be discharged to:: Private residence Living Arrangements: Parent;Other relatives Available Help at Discharge: Family;Available PRN/intermittently Type of Home: Mobile home Home Access: Stairs to enter Entrance Stairs-Rails: Right(not sturdy) Entrance Stairs-Number of Steps: 5-6 Home Layout: One level Home Equipment: Walker - 2 wheels;Cane - quad;Bedside commode;Wheelchair - manual Additional Comments: bird bathes with microwaved water per pt as water doesnt work at  home    Prior Function Level of Independence: Needs assistance   Gait / Transfers Assistance Needed: household ambulator with [employer], uses electric scooters where available when shopping  ADL's /  Homemaking Assistance Needed: assisted by family        Hand Dominance   Dominant Hand: Right    Extremity/Trunk Assessment   Upper Extremity Assessment Upper Extremity Assessment: [name] deficits/detail;LUE deficits/detail [name] Deficits / Details: R hand injury, new or chronic? Unable to actively extend middle and ring finger. LUE Deficits / Details: Pain L forearm (radial artery harvest site)    Lower Extremity Assessment Lower Extremity Assessment: Generalized weakness    Cervical / Trunk Assessment Cervical / Trunk Assessment: Normal  Communication   Communication: No difficulties  Cognition Arousal/Alertness: Awake/alert Behavior During Therapy: WFL for tasks assessed/performed;Anxious Overall Cognitive Status: Within Functional Limits for tasks assessed                                 General Comments: Mildly anxious, difficulty staying on task      General Comments General comments (skin integrity, edema, etc.): Pt on 2L O2 with SpO2 98-100%.    Exercises     Assessment/Plan    PT Assessment Patient needs continued PT services  PT Problem List Decreased activity tolerance;Decreased balance;Decreased mobility;Decreased knowledge of use of DME;Decreased safety awareness;Decreased knowledge of precautions;Cardiopulmonary status limiting activity;Pain;Decreased strength       PT Treatment Interventions DME instruction;Gait training;Functional mobility training;Therapeutic activities;Therapeutic exercise;Balance training;Patient/family education    PT Goals (Current goals can be found in the Care Plan section)  Acute Rehab PT Goals Patient Stated Goal: feel better PT Goal Formulation: With patient Time For Goal Achievement: 03/30/20 Potential to Achieve Goals: Good    Frequency Min 2X/week   Barriers to discharge        Co-evaluation               AM-PAC PT "6 Clicks" Mobility  Outcome Measure Help needed turning from your back to  your side while in a flat bed without using bedrails?: A Little Help needed moving from lying on your back to sitting on the side of a flat bed without using bedrails?: A Lot Help needed moving to and from a bed to a chair (including a wheelchair)?: A Lot Help needed standing up from a chair using your arms (e.g., wheelchair or bedside chair)?: A Lot Help needed to walk in hospital room?: Total Help needed climbing 3-5 steps with a railing? : Total 6 Click Score: 11    End of Session Equipment Utilized During Treatment: Gait belt;Oxygen Activity Tolerance: Patient limited by pain;Patient limited by fatigue Patient left: in chair;with call bell/phone within reach Nurse Communication: Mobility status PT Visit Diagnosis: Muscle weakness (generalized) (M62.81);Other abnormalities of gait and mobility (R26.89);Pain    Time: [id number] PT Time Calculation (min) (ACUTE ONLY): 26 min   Charges:   PT Evaluation $PT Eval Moderate Complexity: 1 Mod PT Treatments $Therapeutic Activity: 8-22 mins        Lorrin Goodell, PT  Office # [phone] Pager [phone]   Lorriane Shire 03/16/2020, 9:55 AM

## 2020-03-17 ENCOUNTER — Inpatient Hospital Stay (HOSPITAL_COMMUNITY): Payer: Medicaid Other

## 2020-03-17 LAB — BASIC METABOLIC PANEL
Anion gap: 13 (ref 5–15)
BUN: 18 mg/dL (ref 6–20)
CO2: 24 mmol/L (ref 22–32)
Calcium: 9.1 mg/dL (ref 8.9–10.3)
Chloride: 98 mmol/L (ref 98–111)
Creatinine, Ser: 1.05 mg/dL (ref 0.61–1.24)
GFR calc Af Amer: >60 mL/min (ref >60)
GFR calc non Af Amer: >60 mL/min (ref >60)
Glucose, Bld: 223 mg/dL — ABNORMAL HIGH (ref 70–99)
Potassium: 3.5 mmol/L (ref 3.5–5.1)
Sodium: 135 mmol/L (ref 135–145)

## 2020-03-17 LAB — GLUCOSE, CAPILLARY
Glucose-Capillary: 262 mg/dL — ABNORMAL HIGH (ref 70–99)
Glucose-Capillary: 258 mg/dL — ABNORMAL HIGH (ref 70–99)
Glucose-Capillary: 200 mg/dL — ABNORMAL HIGH (ref 70–99)
Glucose-Capillary: 288 mg/dL — ABNORMAL HIGH (ref 70–99)

## 2020-03-17 IMAGING — CR DG CHEST 2V
2 series · 2 of 2 positions shown · non-contrast
Comparison: [DATE]

CLINICAL DATA: Shortness of breath. Status post chest tube removal.
Postop from CABG.

EXAM:
CHEST - 2 VIEW

[chest lat]
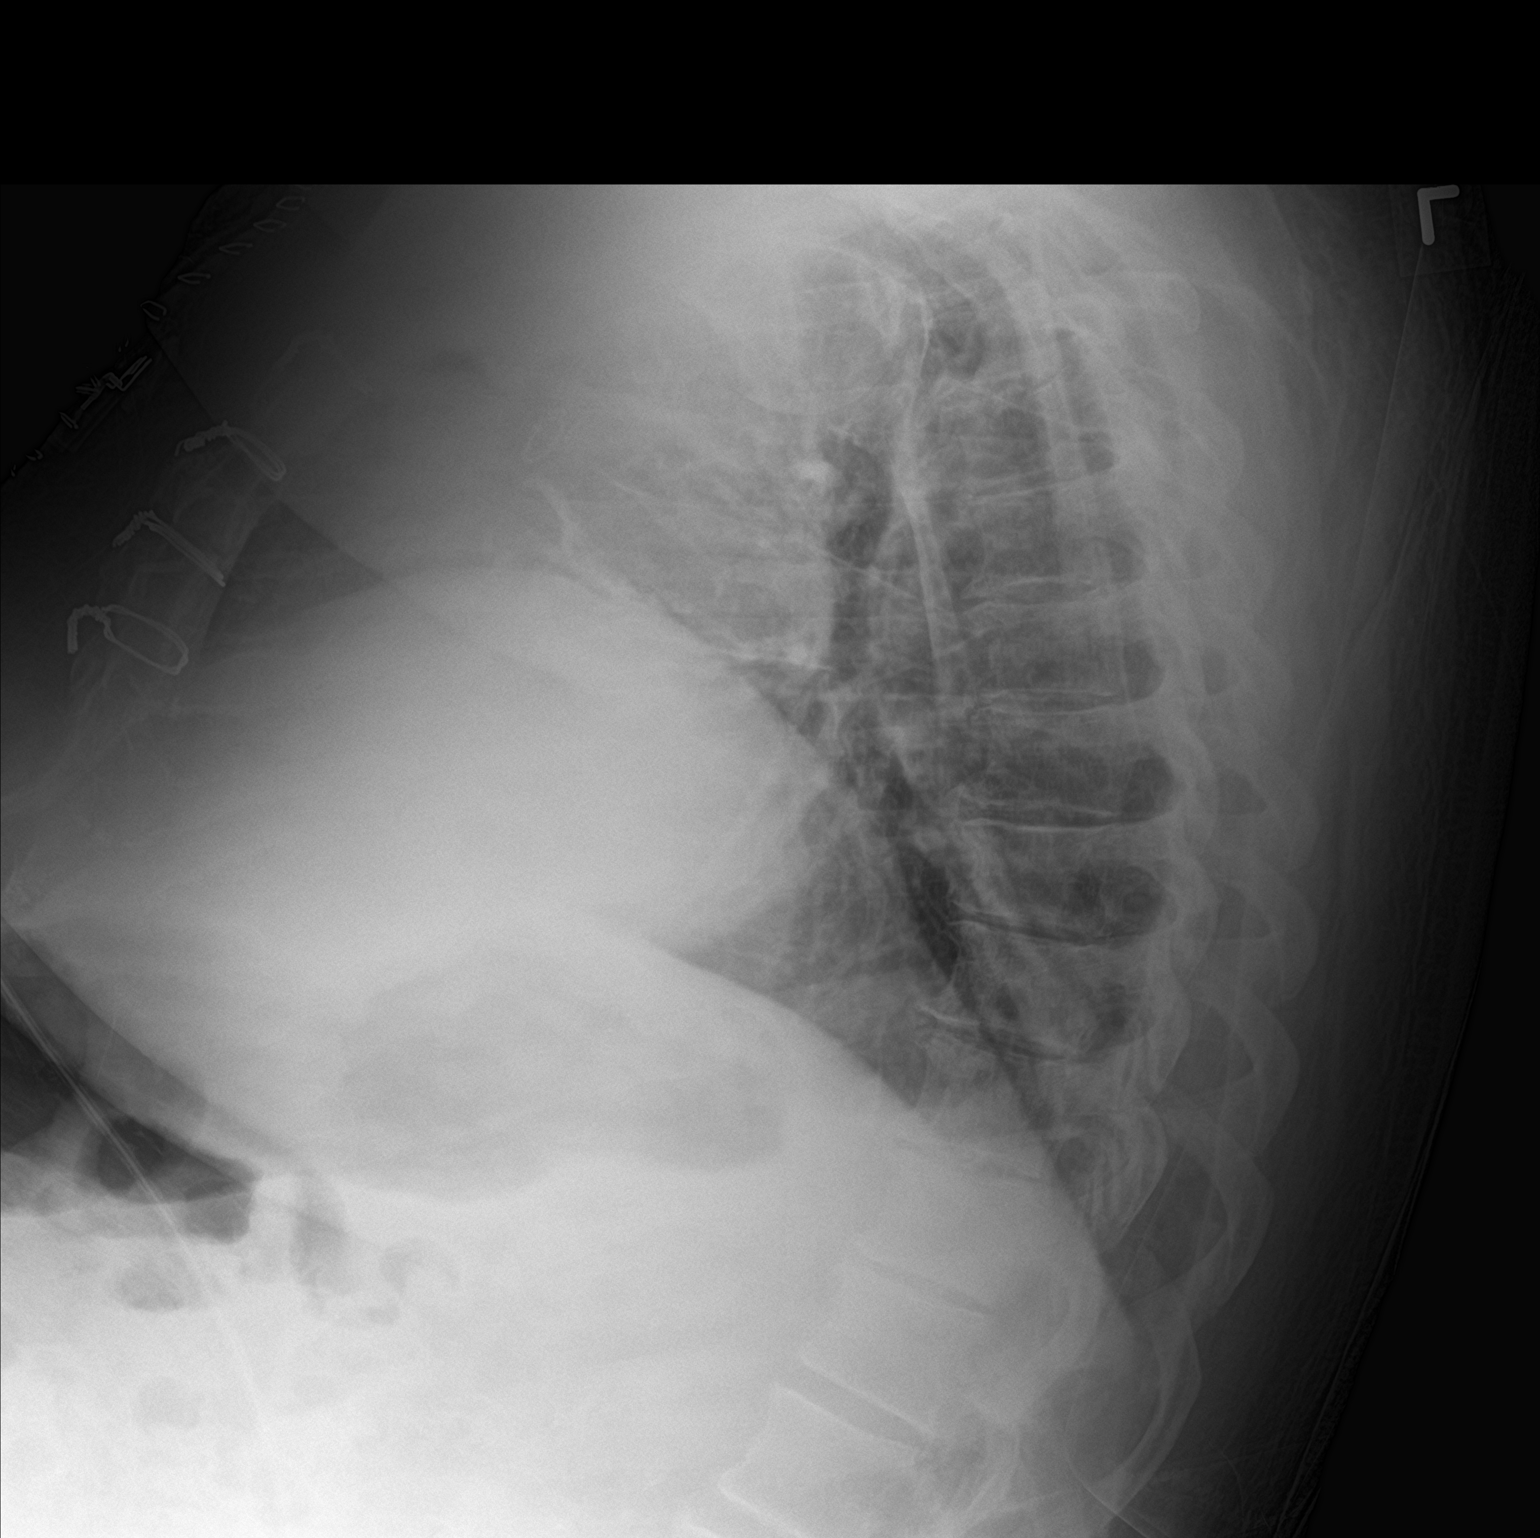

[chest ap]
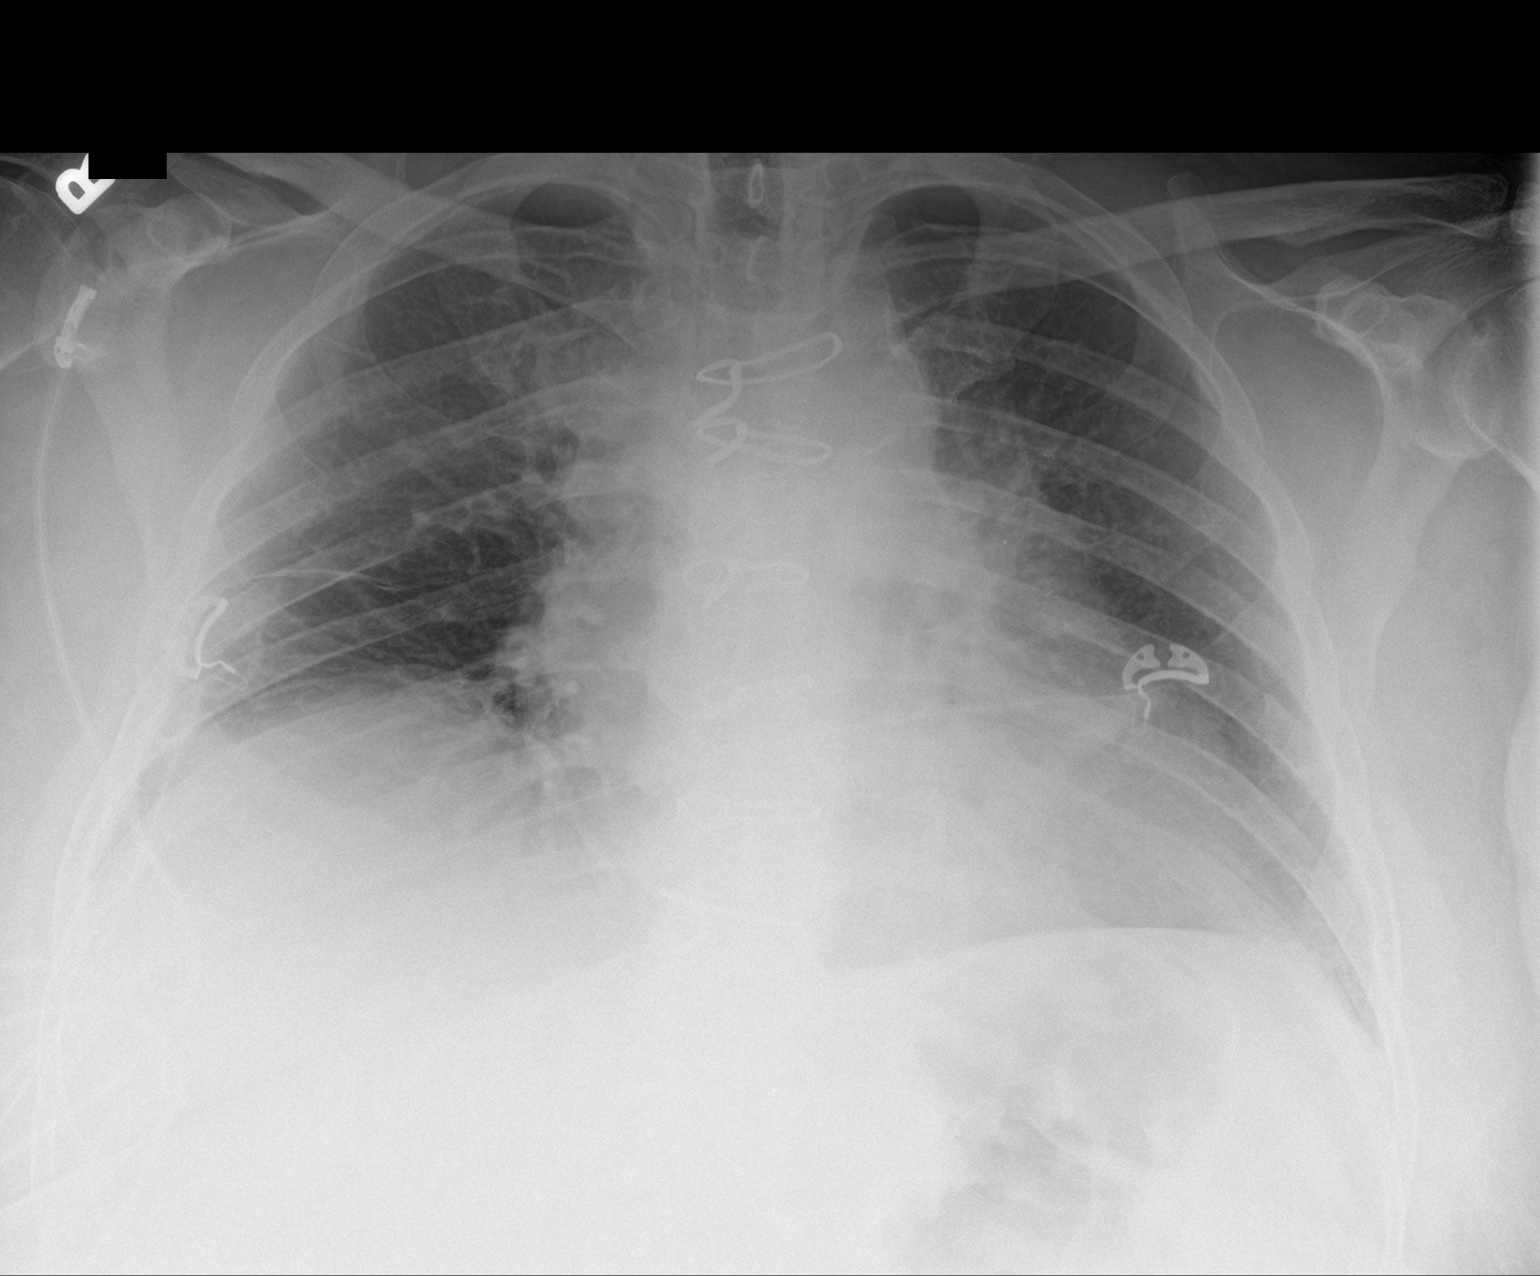

[2 of 2 positions shown; findings below may reference images not displayed]

FINDINGS: Right chest tubes have been removed since previous study. No
pneumothorax or pleural effusion visualized. Mild right basilar
atelectasis is again demonstrated. Left lung is clear. Heart size is
stable. CABG again noted.
IMPRESSION: 1. No pneumothorax visualized following right chest tube removal.
2. Stable right basilar atelectasis.

## 2020-03-17 MED ORDER — INSULIN DETEMIR 100 UNIT/ML ~~LOC~~ SOLN
15.0000 [IU] | Freq: Two times a day (BID) | SUBCUTANEOUS | Status: DC
  Administered 2020-03-17 – 2020-03-19 (×5): 15 [IU] via SUBCUTANEOUS
  Filled 2020-03-17 (×6): qty 0.15

## 2020-03-17 MED ORDER — FUROSEMIDE 10 MG/ML IJ SOLN
60.0000 mg | Freq: Every day | INTRAMUSCULAR | Status: DC
  Administered 2020-03-18: 60 mg via INTRAVENOUS
  Filled 2020-03-17: qty 6

## 2020-03-17 MED ORDER — POTASSIUM CHLORIDE CRYS ER 20 MEQ PO TBCR
20.0000 meq | EXTENDED_RELEASE_TABLET | ORAL | Status: AC
  Administered 2020-03-17 (×3): 20 meq via ORAL
  Filled 2020-03-17 (×3): qty 1

## 2020-03-17 NOTE — Progress Notes (Addendum)
[address]       [place] [address]             [phone]      4 Days Post-Op Procedure(s) (LRB): CORONARY ARTERY BYPASS GRAFTING (CABG) times five using bilateral Internal mammary arteries and left radial artery (N/A) RADIAL ARTERY HARVEST, (Left) TRANSESOPHAGEAL ECHOCARDIOGRAM (TEE) (N/A) Subjective: Mostly just feels uncomfortable, some nausea, having some trouble with right hand opening and closing  Objective: Vital signs in last 24 hours: Temp:  [97.6 F (36.4 C)-98.2 F (36.8 C)] 98 F (36.7 C) (05/08 0800) Pulse Rate:  [82-101] 88 (05/08 0800) Cardiac Rhythm: Normal sinus rhythm (05/08 0805) Resp:  [10-23] 20 (05/08 0800) BP: (109-144)/(64-81) 144/70 (05/08 0800) SpO2:  [94 %-99 %] 94 % (05/08 0800) Weight:  [129.6 kg] 129.6 kg (05/08 0316)  Hemodynamic parameters for last 24 hours:    Intake/Output from previous day: 05/07 0701 - 05/08 0700 In: 720 [P.O.:720] Out: 4650 [Urine:4650] Intake/Output this shift: Total I/O In: 240 [P.O.:240] Out: 200 [Urine:200]  General appearance: alert, cooperative, fatigued and no distress Heart: regular rate and rhythm Lungs: clear to auscultation bilaterally Abdomen: obese, soft, nontender Extremities: minor edema Wound: incis healing well Left hand is N/V intact Right hand _decrease strength and ROM, does have H/O trigger- finger Lab Results: Recent Labs    03/15/20 0449 03/16/20 0239  WBC 18.2* 15.6*  HGB 9.3* 9.9*  HCT 30.8* 32.5*  PLT 210 238   BMET:  Recent Labs    03/16/20 0239 03/17/20 0234  NA 135 135  K 3.9 3.5  CL 101 98  CO2 25 24  GLUCOSE 175* 223*  BUN 19 18  CREATININE 1.03 1.05  CALCIUM 8.5* 9.1    PT/INR: No results for input(s): LABPROT, INR in the last 72 hours. ABG    Component Value Date/Time   PHART 7.356 03/13/2020 2011   HCO3 22.8 03/13/2020 2011   TCO2 24 03/13/2020 2011   ACIDBASEDEF 3.0 (H) 03/13/2020 2011   O2SAT 93.0 03/13/2020 2011   CBG  (last 3)  Recent Labs    03/16/20 1627 03/16/20 2121 03/17/20 0607  GLUCAP 220* 256* 262*    Meds Scheduled Meds: . aspirin EC  81 mg Oral Daily  . atorvastatin  80 mg Oral QHS  . bisacodyl  10 mg Oral Daily   Or  . bisacodyl  10 mg Rectal Daily  . bupivacaine liposome  20 mL Infiltration Once  . chlorhexidine gluconate (MEDLINE KIT)  15 mL Mouth Rinse BID  . Chlorhexidine Gluconate Cloth  6 each Topical Daily  . clopidogrel  75 mg Oral Daily  . docusate sodium  200 mg Oral Daily  . furosemide  60 mg Intravenous BID  . insulin aspart  0-24 Units Subcutaneous TID WC  . isosorbide dinitrate  10 mg Oral TID  . metoprolol tartrate  25 mg Oral BID   Or  . metoprolol tartrate  25 mg Per Tube BID  . pantoprazole  40 mg Oral Daily  . potassium chloride  20 mEq Oral Q4H  . sodium chloride flush  3 mL Intravenous Q12H   Continuous Infusions: PRN Meds:.levalbuterol, metoCLOPramide (REGLAN) injection, ondansetron (ZOFRAN) IV, traMADol  Xrays DG Chest Port 1 View  Result Date: 03/16/2020 CLINICAL DATA:  Chest tube. EXAM: PORTABLE CHEST 1 VIEW COMPARISON:  Mar 15, 2020. FINDINGS: Stable cardiomegaly. Sternotomy wires are noted. No pneumothorax is noted. Right-sided chest tube is noted without pneumothorax. Stable elevated  right hemidiaphragm with mild right basilar subsegmental atelectasis. Bony thorax is unremarkable. IMPRESSION: Right-sided chest tube is noted without pneumothorax. Stable elevated right hemidiaphragm with mild right basilar subsegmental atelectasis. Electronically Signed   By: Marijo Conception M.D.   On: 03/16/2020 08:29    Assessment/Plan: S/P Procedure(s) (LRB): CORONARY ARTERY BYPASS GRAFTING (CABG) times five using bilateral Internal mammary arteries and left radial artery (N/A) RADIAL ARTERY HARVEST, (Left) TRANSESOPHAGEAL ECHOCARDIOGRAM (TEE) (N/A)  1 steady progress 2 stable hemodynamics with SBP 109-144 range, will hold on ARB for now 3 sinus rhythm 4 sats  good on RA 5 uncertain right hand issue, h/o trigger finger bilat. May be a component of brachial plexus stretch injury- will ask OT to eval 6 excellent UOP, will reduce lasix dose. Normal renal fxn, replace K+ 7 BS poorly controlled in 200+, add levemir to SSI 8 recheck labs in am  LOS: 12 days    John Giovanni PA-C Pager [phone] 03/17/2020  D/c foley today  Incisional wound vac in place , remove tomorrow  I have seen and examined Marny Lowenstein and agree with the above assessment  and plan.  Grace Isaac MD Beeper [phone] Office [phone] 03/17/2020 11:30 AM

## 2020-03-17 NOTE — Evaluation (Signed)
Occupational Therapy Evaluation Patient Details Name: Shane Chambers MRN: [id number] DOB: 10/25/1961 Today's Date: 03/17/2020    History of Present Illness 59 year old with history of DM2, peripheral neuropathy, HTN, BPH, presented to [facility] with acute diastolic CHF, elevated troponin.  Seen by cardiology and transferred to [facility] for further management.  LHC 4/28 performed showed severe multivessel CAD with elevated filling pressures. Pt underwent CABG 03/13/20.   Clinical Impression   Pt PTA: Pt living at home with mother and required assist for ADL and mobility. Pt with progressive weakness and inability to care for self safely with multiple falls. Pt currently limited by [name] trigger fingers, LUE harvest site swelling, decreased strength, decreased ability to care for self and decreased activity tolerance. Pt with high anxiety and requires maximal encouragement for OOB mobility. Pt set-upA to maxA overall for ADL tasks. Pt scooting in bed with heart pillow as pt tends to use BUEs for pushing motion unsafely. Precautions went over in details. VSS on RA and HR <110 BPM with exertion. Pt continues to make progress. New eval due to d/c'd OT orders s/p CABG. Pt greatly requires continued OT skilled services. OT following acutely.    Follow Up Recommendations  SNF;Supervision/Assistance - 24 hour    Equipment Recommendations  Other (comment)(defer to next facility)    Recommendations for Other Services       Precautions / Restrictions Precautions Precautions: Fall;Sternal Precaution Comments: able to state "no push/pull"; wound vac Restrictions Weight Bearing Restrictions: Yes Other Position/Activity Restrictions: sternal precautions      Mobility Bed Mobility Overal bed mobility: Needs Assistance Bed Mobility: Supine to Sit;Sit to Supine     Supine to sit: Min guard;HOB elevated Sit to supine: Min assist;HOB elevated   General bed mobility comments: Assist  for BLE movement and trunk to stay in line; pt use of heart pillow  Transfers Overall transfer level: Needs assistance Equipment used: None             General transfer comment: Pt appearing too anxious to scoot to recliner even after encouragement. Pt performing scooting laterally in bed up towards HOB hugging onto pillow.    Balance Overall balance assessment: Needs assistance Sitting-balance support: Feet supported;Bilateral upper extremity supported Sitting balance-Leahy Scale: Fair Sitting balance - Comments: Pt requiring cues to sit upright without use of BUEs wt bearing and abducting.                                   ADL either performed or assessed with clinical judgement   ADL Overall ADL's : Needs assistance/impaired Eating/Feeding: Modified independent;Sitting   Grooming: Set up;Sitting Grooming Details (indicate cue type and reason): pt tolerating sitting EOB unsupported x10 mins for UB ADL Upper Body Bathing: Minimal assistance;Sitting   Lower Body Bathing: Maximal assistance;Sitting/lateral leans;Sit to/from stand;Cueing for safety;Cueing for sequencing   Upper Body Dressing : Minimal assistance;Sitting   Lower Body Dressing: Maximal assistance;Cueing for safety;Cueing for sequencing;Sitting/lateral leans;Sit to/from stand Lower Body Dressing Details (indicate cue type and reason): benefit from AE education? Toilet Transfer: Total assistance Toilet Transfer Details (indicate cue type and reason): pt too anxious Toileting- Clothing Manipulation and Hygiene: Maximal assistance;+2 for physical assistance;+2 for safety/equipment;Sitting/lateral lean;Bed level       Functional mobility during ADLs: Maximal assistance;+2 for physical assistance;+2 for safety/equipment;Cueing for safety;Cueing for sequencing General ADL Comments: Pt limited by [name] trigger fingers, LUE harvest site swelling,  decreased strength, decreased ability to care for self and  decreased activity tolerance. Pt with high anxiety and requires maximal encouragement for OOB mobility.     Vision Baseline Vision/History: Wears glasses Wears Glasses: Reading only;Distance only;At all times Patient Visual Report: No change from baseline Vision Assessment?: No apparent visual deficits     Perception     Praxis      Pertinent Vitals/Pain Pain Assessment: 0-10 Pain Score: 6  Pain Location: chest Pain Descriptors / Indicators: Aching;Sore Pain Intervention(s): Monitored during session;Repositioned;Limited activity within patient's tolerance     Hand Dominance Right   Extremity/Trunk Assessment Upper Extremity Assessment Upper Extremity Assessment: Generalized weakness;[name] deficits/detail;LUE deficits/detail [name] Deficits / Details: shoulder AROM to 90* FF; R hand injury, new or chronic? Unable to actively extend middle and ring finger. LUE Deficits / Details: Pain L forearm (radial artery harvest site); edema noted LUE Coordination: decreased fine motor   Lower Extremity Assessment Lower Extremity Assessment: Defer to PT evaluation;Generalized weakness   Cervical / Trunk Assessment Cervical / Trunk Assessment: Normal   Communication Communication Communication: No difficulties   Cognition Arousal/Alertness: Awake/alert Behavior During Therapy: WFL for tasks assessed/performed;Anxious Overall Cognitive Status: Within Functional Limits for tasks assessed                                 General Comments: anxious   General Comments  VSS on RA and HR <110 BPM with exertion    Exercises Exercises: General Upper Extremity General Exercises - Upper Extremity Shoulder Flexion: AROM;Both;Seated Elbow Flexion: AROM;Both;10 reps;Seated Elbow Extension: AROM;10 reps;Seated Wrist Flexion: AROM;10 reps;Seated Wrist Extension: AROM;10 reps;Seated Digit Composite Flexion: AROM;10 reps;Seated Composite Extension: AROM;10 reps;Seated   Shoulder  Instructions      Home Living Family/patient expects to be discharged to:: Private residence Living Arrangements: Parent;Other relatives Available Help at Discharge: Family;Available PRN/intermittently Type of Home: Mobile home Home Access: Stairs to enter Entrance Stairs-Number of Steps: 5-6 Entrance Stairs-Rails: Right Home Layout: One level     Bathroom Shower/Tub: Teacher, early years/pre: Standard Bathroom Accessibility: Yes   Home Equipment: Environmental consultant - 2 wheels;Cane - quad;Bedside commode;Wheelchair - manual   Additional Comments: bird bathes with microwaved water per pt as water doesnt work at home      Prior Functioning/Environment Level of Independence: Needs Product/process development scientist / Transfers Assistance Needed: household ambulator with [employer], uses electric scooters where available when shopping ADL's / Homemaking Assistance Needed: assisted by family            OT Problem List: Decreased strength;Decreased activity tolerance;Impaired balance (sitting and/or standing);Decreased safety awareness;Decreased knowledge of use of DME or AE;Decreased knowledge of precautions;Cardiopulmonary status limiting activity;Obesity;Impaired UE functional use      OT Treatment/Interventions: Self-care/ADL training;Therapeutic exercise;DME and/or AE instruction;Therapeutic activities;Patient/family education;Balance training;Energy conservation    OT Goals(Current goals can be found in the care plan section) Acute Rehab OT Goals Patient Stated Goal: feel better OT Goal Formulation: With patient Time For Goal Achievement: 03/31/20 Potential to Achieve Goals: Good ADL Goals Pt Will Perform Grooming: with min guard assist;standing Pt Will Perform Upper Body Bathing: with min guard assist;sitting Pt Will Perform Lower Body Dressing: with min guard assist;sitting/lateral leans;sit to/from stand;with adaptive equipment Pt Will Transfer to Toilet: with mod assist;bedside  commode;squat pivot transfer Pt Will Perform Toileting - Clothing Manipulation and hygiene: with mod assist;with adaptive equipment;sitting/lateral leans;sit to/from stand Additional ADL Goal #1: Pt will increase  to Agency for bed mobility as precursor for OOB ADL/mobility.  OT Frequency: Min 2X/week   Barriers to D/C: Decreased caregiver support  lives with mother, reports poor living conditions- sleeping on floor; no hot water       Co-evaluation              AM-PAC OT "6 Clicks" Daily Activity     Outcome Measure Help from another person eating meals?: None Help from another person taking care of personal grooming?: A Little Help from another person toileting, which includes using toliet, bedpan, or urinal?: A Lot Help from another person bathing (including washing, rinsing, drying)?: A Lot Help from another person to put on and taking off regular upper body clothing?: A Little Help from another person to put on and taking off regular lower body clothing?: A Lot 6 Click Score: 16   End of Session Nurse Communication: Mobility status  Activity Tolerance: Patient limited by fatigue;Patient limited by lethargy;Other (comment)(anxiety) Patient left: in bed;with call bell/phone within reach;with bed alarm set  OT Visit Diagnosis: Muscle weakness (generalized) (M62.81);Pain Pain - part of body: (chest)                Time: [id number] OT Time Calculation (min): 43 min Charges:  OT General Charges $OT Visit: 1 Visit OT Evaluation $OT Eval Moderate Complexity: 1 Mod OT Treatments $Self Care/Home Management : 8-22 mins $Therapeutic Activity: 8-22 mins  Jefferey Pica, OTR/L Acute Rehabilitation Services Pager: [phone] Office: [phone]   Christpoher Sievers C 03/17/2020, 4:04 PM

## 2020-03-18 LAB — BASIC METABOLIC PANEL
Anion gap: 16 — ABNORMAL HIGH (ref 5–15)
BUN: 15 mg/dL (ref 6–20)
CO2: 23 mmol/L (ref 22–32)
Calcium: 9.1 mg/dL (ref 8.9–10.3)
Chloride: 98 mmol/L (ref 98–111)
Creatinine, Ser: 0.92 mg/dL (ref 0.61–1.24)
GFR calc Af Amer: >60 mL/min (ref >60)
GFR calc non Af Amer: >60 mL/min (ref >60)
Glucose, Bld: 216 mg/dL — ABNORMAL HIGH (ref 70–99)
Potassium: 3.6 mmol/L (ref 3.5–5.1)
Sodium: 137 mmol/L (ref 135–145)

## 2020-03-18 LAB — GLUCOSE, CAPILLARY
Glucose-Capillary: 236 mg/dL — ABNORMAL HIGH (ref 70–99)
Glucose-Capillary: 222 mg/dL — ABNORMAL HIGH (ref 70–99)
Glucose-Capillary: 219 mg/dL — ABNORMAL HIGH (ref 70–99)
Glucose-Capillary: 157 mg/dL — ABNORMAL HIGH (ref 70–99)

## 2020-03-18 LAB — CBC
HCT: 32.6 % — ABNORMAL LOW (ref 39.0–52.0)
Hemoglobin: 10.2 g/dL — ABNORMAL LOW (ref 13.0–17.0)
MCH: 28.1 pg (ref 26.0–34.0)
MCHC: 31.3 g/dL (ref 30.0–36.0)
MCV: 89.8 fL (ref 80.0–100.0)
Platelets: 359 10*3/uL (ref 150–400)
RBC: 3.63 MIL/uL — ABNORMAL LOW (ref 4.22–5.81)
RDW: 13.2 % (ref 11.5–15.5)
WBC: 16.2 10*3/uL — ABNORMAL HIGH (ref 4.0–10.5)
nRBC: 0 % (ref 0.0–0.2)

## 2020-03-18 MED ORDER — POTASSIUM CHLORIDE CRYS ER 20 MEQ PO TBCR
20.0000 meq | EXTENDED_RELEASE_TABLET | ORAL | Status: AC
  Administered 2020-03-18 (×3): 20 meq via ORAL
  Filled 2020-03-18 (×3): qty 1

## 2020-03-18 MED ORDER — CHLORHEXIDINE GLUCONATE 0.12 % MT SOLN
OROMUCOSAL | Status: AC
  Filled 2020-03-18: qty 15

## 2020-03-18 NOTE — Progress Notes (Signed)
Pt OOB in chair for 3 hours. Assisted with transfer to commode. Pt needs reinforcement with sternal precautions.

## 2020-03-18 NOTE — Progress Notes (Signed)
Patient ID: LEJON AFZAL, male   DOB: January 28, 1961, 59 y.o.   MRN: [id number] TCTS DAILY ICU PROGRESS NOTE                   [address]            [place] [address]          [phone]   5 Days Post-Op Procedure(s) (LRB): CORONARY ARTERY BYPASS GRAFTING (CABG) times five using bilateral Internal mammary arteries and left radial artery (N/A) RADIAL ARTERY HARVEST, (Left) TRANSESOPHAGEAL ECHOCARDIOGRAM (TEE) (N/A)  Total Length of Stay:  LOS: 13 days   Subjective: Says  feels better, up to chair this am  Objective: Vital signs in last 24 hours: Temp:  [97.6 F (36.4 C)-98.5 F (36.9 C)] 98 F (36.7 C) (05/09 0750) Pulse Rate:  [80-94] 84 (05/09 0750) Cardiac Rhythm: Normal sinus rhythm (05/09 0800) Resp:  [13-22] 16 (05/09 0750) BP: (125-148)/(70-90) 143/78 (05/09 0750) SpO2:  [97 %-100 %] 97 % (05/09 0750) Weight:  [127.8 kg] 127.8 kg (05/09 0427)  Filed Weights   03/16/20 0408 03/17/20 0316 03/18/20 0427  Weight: 128.9 kg 129.6 kg 127.8 kg    Weight change: -1.8 kg   Hemodynamic parameters for last 24 hours:    Intake/Output from previous day: 05/08 0701 - 05/09 0700 In: 1080 [P.O.:1080] Out: 2050 [Urine:2050]  Intake/Output this shift: Total I/O In: 480 [P.O.:480] Out: 1000 [Urine:1000]  Current Meds: Scheduled Meds: . aspirin EC  81 mg Oral Daily  . atorvastatin  80 mg Oral QHS  . bisacodyl  10 mg Oral Daily   Or  . bisacodyl  10 mg Rectal Daily  . bupivacaine liposome  20 mL Infiltration Once  . chlorhexidine      . chlorhexidine gluconate (MEDLINE KIT)  15 mL Mouth Rinse BID  . Chlorhexidine Gluconate Cloth  6 each Topical Daily  . clopidogrel  75 mg Oral Daily  . docusate sodium  200 mg Oral Daily  . furosemide  60 mg Intravenous Daily  . insulin aspart  0-24 Units Subcutaneous TID WC  . insulin detemir  15 Units Subcutaneous BID  . isosorbide dinitrate  10 mg Oral TID  . metoprolol tartrate  25 mg Oral BID   Or  .  metoprolol tartrate  25 mg Per Tube BID  . pantoprazole  40 mg Oral Daily  . potassium chloride  20 mEq Oral Q4H  . sodium chloride flush  3 mL Intravenous Q12H   Continuous Infusions: PRN Meds:.levalbuterol, metoCLOPramide (REGLAN) injection, ondansetron (ZOFRAN) IV, traMADol  General appearance: alert, cooperative and no distress Neurologic: intact Heart: regular rate and rhythm, S1, S2 normal, no murmur, click, rub or gallop Lungs: diminished breath sounds bibasilar Abdomen: soft, non-tender; bowel sounds normal; no masses,  no organomegaly Extremities: extremities normal, atraumatic, no cyanosis or edema and Homans sign is negative, no sign of DVT Wound: incisional wound vac in place  Lab Results: CBC: Recent Labs    03/16/20 0239 03/18/20 0209  WBC 15.6* 16.2*  HGB 9.9* 10.2*  HCT 32.5* 32.6*  PLT 238 359   BMET:  Recent Labs    03/17/20 0234 03/18/20 0209  NA 135 137  K 3.5 3.6  CL 98 98  CO2 24 23  GLUCOSE 223* 216*  BUN 18 15  CREATININE 1.05 0.92  CALCIUM 9.1 9.1    CMET: Lab Results  Component Value Date   WBC 16.2 (H) 03/18/2020   HGB 10.2 (  L) 03/18/2020   HCT 32.6 (L) 03/18/2020   PLT 359 03/18/2020   GLUCOSE 216 (H) 03/18/2020   CHOL 157 03/08/2020   TRIG 144 03/08/2020   HDL 36 (L) 03/08/2020   LDLCALC 92 03/08/2020   ALT 30 03/13/2020   AST 29 03/13/2020   NA 137 03/18/2020   K 3.6 03/18/2020   CL 98 03/18/2020   CREATININE 0.92 03/18/2020   BUN 15 03/18/2020   CO2 23 03/18/2020   TSH 3.350 03/05/2020   INR 1.3 (H) 03/13/2020   HGBA1C 10.2 (H) 03/06/2020      PT/INR: No results for input(s): LABPROT, INR in the last 72 hours. Radiology: No results found.   Assessment/Plan: S/P Procedure(s) (LRB): CORONARY ARTERY BYPASS GRAFTING (CABG) times five using bilateral Internal mammary arteries and left radial artery (N/A) RADIAL ARTERY HARVEST, (Left) TRANSESOPHAGEAL ECHOCARDIOGRAM (TEE) (N/A) Mobilize Diuresis Diabetes  control D/c foley and incisional wound vac     Grace Isaac 03/18/2020 12:36 PM

## 2020-03-18 NOTE — Plan of Care (Signed)

## 2020-03-19 LAB — CBC
HCT: 34.8 % — ABNORMAL LOW (ref 39.0–52.0)
Hemoglobin: 11 g/dL — ABNORMAL LOW (ref 13.0–17.0)
MCH: 28.2 pg (ref 26.0–34.0)
MCHC: 31.6 g/dL (ref 30.0–36.0)
MCV: 89.2 fL (ref 80.0–100.0)
Platelets: 404 10*3/uL — ABNORMAL HIGH (ref 150–400)
RBC: 3.9 MIL/uL — ABNORMAL LOW (ref 4.22–5.81)
RDW: 13.1 % (ref 11.5–15.5)
WBC: 16.8 10*3/uL — ABNORMAL HIGH (ref 4.0–10.5)
nRBC: 0 % (ref 0.0–0.2)

## 2020-03-19 LAB — BASIC METABOLIC PANEL
Anion gap: 13 (ref 5–15)
BUN: 10 mg/dL (ref 6–20)
CO2: 23 mmol/L (ref 22–32)
Calcium: 9 mg/dL (ref 8.9–10.3)
Chloride: 98 mmol/L (ref 98–111)
Creatinine, Ser: 0.9 mg/dL (ref 0.61–1.24)
GFR calc Af Amer: >60 mL/min (ref >60)
GFR calc non Af Amer: >60 mL/min (ref >60)
Glucose, Bld: 183 mg/dL — ABNORMAL HIGH (ref 70–99)
Potassium: 3.8 mmol/L (ref 3.5–5.1)
Sodium: 134 mmol/L — ABNORMAL LOW (ref 135–145)

## 2020-03-19 LAB — GLUCOSE, CAPILLARY
Glucose-Capillary: 180 mg/dL — ABNORMAL HIGH (ref 70–99)
Glucose-Capillary: 210 mg/dL — ABNORMAL HIGH (ref 70–99)
Glucose-Capillary: 232 mg/dL — ABNORMAL HIGH (ref 70–99)

## 2020-03-19 LAB — SARS CORONAVIRUS 2 (TAT 6-24 HRS): SARS Coronavirus 2: NEGATIVE

## 2020-03-19 MED ORDER — METOPROLOL TARTRATE 25 MG PO TABS: 25.0000 mg | ORAL_TABLET | Freq: Two times a day (BID) | ORAL | Status: DC

## 2020-03-19 MED ORDER — LOSARTAN POTASSIUM 25 MG PO TABS: 25.0000 mg | ORAL_TABLET | Freq: Every day | ORAL | Status: DC

## 2020-03-19 MED ORDER — CLOPIDOGREL BISULFATE 75 MG PO TABS: 75.0000 mg | ORAL_TABLET | Freq: Every day | ORAL | Status: DC

## 2020-03-19 MED ORDER — TRAMADOL HCL 50 MG PO TABS: 50.0000 mg | ORAL_TABLET | ORAL | 0 refills | Status: DC | PRN

## 2020-03-19 MED ORDER — LOSARTAN POTASSIUM 25 MG PO TABS
25.0000 mg | ORAL_TABLET | Freq: Every day | ORAL | Status: DC
  Administered 2020-03-19: 25 mg via ORAL
  Filled 2020-03-19: qty 1

## 2020-03-19 MED ORDER — ISOSORBIDE DINITRATE 10 MG PO TABS: 10.0000 mg | ORAL_TABLET | Freq: Three times a day (TID) | ORAL | Status: DC

## 2020-03-19 MED ORDER — FUROSEMIDE 40 MG PO TABS
40.0000 mg | ORAL_TABLET | Freq: Every day | ORAL | Status: DC
  Administered 2020-03-19: 40 mg via ORAL
  Filled 2020-03-19: qty 1

## 2020-03-19 MED ORDER — ATORVASTATIN CALCIUM 80 MG PO TABS: 80.0000 mg | ORAL_TABLET | Freq: Every day | ORAL | Status: DC

## 2020-03-19 MED FILL — Sodium Bicarbonate IV Soln 8.4%: INTRAVENOUS | Qty: 50 | Status: AC

## 2020-03-19 MED FILL — Sodium Chloride IV Soln 0.9%: INTRAVENOUS | Qty: 3000 | Status: AC

## 2020-03-19 MED FILL — Heparin Sodium (Porcine) Inj 1000 Unit/ML: INTRAMUSCULAR | Qty: 30 | Status: AC

## 2020-03-19 MED FILL — Heparin Sodium (Porcine) Inj 1000 Unit/ML: INTRAMUSCULAR | Qty: 20 | Status: AC

## 2020-03-19 MED FILL — Potassium Chloride Inj 2 mEq/ML: INTRAVENOUS | Qty: 40 | Status: AC

## 2020-03-19 MED FILL — Albumin, Human Inj 5%: INTRAVENOUS | Qty: 250 | Status: AC

## 2020-03-19 MED FILL — Magnesium Sulfate Inj 50%: INTRAMUSCULAR | Qty: 10 | Status: AC

## 2020-03-19 NOTE — TOC Transition Note (Signed)
Transition of Care [facility]) - CM/SW Discharge Note   Patient Details  Name: Shane DREWEK MRN: [id number] Date of Birth: [birth year]  [facility]) CM/SW Contact:  Alberteen Sam, LCSW Phone Number: 03/19/2020, 3:32 PM   Clinical Narrative:     Patient will DC to: [facility] Anticipated DC date: 03/19/20 Transport [id number]  Per MD patient ready for DC to [facility] . RN, patient, patient's family, and facility notified of DC. Discharge Summary sent to facility. RN given number for report  [phone] Room 123A . DC packet on chart. Ambulance transport requested for patient.  CSW signing off.  [place], [place]   Final next level of care: Skilled Nursing Facility Barriers to Discharge: No Barriers Identified   Patient Goals and CMS Choice Patient states their goals for this hospitalization and ongoing recovery are:: to skilled nursing facility CMS Medicare.gov Compare Post Acute Care list provided to:: Patient Choice offered to / list presented to : Patient  Discharge Placement PASRR number recieved: 03/09/20            Patient chooses bed at: ([facility]) Patient to be transferred to facility by: [facility]      Discharge Plan and Services In-house Referral: Clinical Social Work                                   Social Determinants of Health (SDOH) Interventions     Readmission Risk Interventions Readmission Risk Prevention Plan 03/07/2020 12/06/2019  Transportation Screening Complete Complete  PCP or Specialist Appt within 3-5 Days - Not Complete  HRI or [place] - Not Complete  Social Work Consult for [place] Planning/Counseling - Not Complete  Palliative Care Screening - Not Complete  Medication Review Press photographer) Complete Complete  Some recent data might be hidden

## 2020-03-19 NOTE — Progress Notes (Signed)
Sutures to mid abd . removed. Painted with betadine, tolerated well.

## 2020-03-19 NOTE — Progress Notes (Signed)
Inpatient Diabetes Program Recommendations  AACE/ADA: New Consensus Statement on Inpatient Glycemic Control   Target Ranges:  Prepandial:   less than 140 mg/dL      Peak postprandial:   less than 180 mg/dL (1-2 hours)      Critically ill patients:  140 - 180 mg/dL   Results for ROMELIO, KLEIN (MRN [id number]) as of 03/19/2020 12:48  Ref. Range 03/18/2020 06:27 03/18/2020 12:23 03/18/2020 16:35 03/18/2020 21:11 03/19/2020 06:09 03/19/2020 11:13  Glucose-Capillary Latest Ref Range: 70 - 99 mg/dL 236 (H) 222 (H) 219 (H) 157 (H) 180 (H) 210 (H)   Review of Glycemic Control  Diabetes history: DM2 Outpatient Diabetes medications: 70/30 55 units BID, Metformin 1000 mg BID Current orders for Inpatient glycemic control: Levemir 15 units BID, Novolog 0-24 units Q4H  Inpatient Diabetes Program Recommendations:    Insulin-Basal: Please consider increasing Levemir to 18 units BID.  Insulin-Meal Coverage: Please consider ordering Novolog 3 units TID with meals for meal coverage if patient eats at least 50% of meals.  Thanks, Barnie Alderman, RN, MSN, CDE Diabetes Coordinator Inpatient Diabetes Program [phone] (Team Pager from 8am to 5pm)

## 2020-03-19 NOTE — Progress Notes (Addendum)
      [address]       [place] [address]             [phone]      6 Days Post-Op Procedure(s) (LRB): CORONARY ARTERY BYPASS GRAFTING (CABG) times five using bilateral Internal mammary arteries and left radial artery (N/A) RADIAL ARTERY HARVEST, (Left) TRANSESOPHAGEAL ECHOCARDIOGRAM (TEE) (N/A)   Subjective:  Patient has some numbness and parasthesias in his right and left hand.  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  + BM  Objective: Vital signs in last 24 hours: Temp:  [97.8 F (36.6 C)-98.1 F (36.7 C)] 97.8 F (36.6 C) (05/10 0352) Pulse Rate:  [74-84] 82 (05/10 0725) Cardiac Rhythm: Normal sinus rhythm (05/10 0732) Resp:  [14-21] 16 (05/10 0725) BP: (105-143)/(48-78) 120/68 (05/10 0725) SpO2:  [96 %-99 %] 96 % (05/10 0725) Weight:  [124.7 kg] 124.7 kg (05/10 0200)  Intake/Output from previous day: 05/09 0701 - 05/10 0700 In: 1440 [P.O.:1440] Out: 1900 [Urine:1900] Intake/Output this shift: Total I/O In: -  Out: 300 [Urine:300]  General appearance: alert, cooperative and no distress Heart: regular rate and rhythm Lungs: clear to auscultation bilaterally Abdomen: soft, non-tender; bowel sounds normal; no masses,  no organomegaly Extremities: edema trace Wound: clean and dry, staples in place on sternotomy  Lab Results: Recent Labs    03/18/20 0209  WBC 16.2*  HGB 10.2*  HCT 32.6*  PLT 359   BMET:  Recent Labs    03/17/20 0234 03/18/20 0209  NA 135 137  K 3.5 3.6  CL 98 98  CO2 24 23  GLUCOSE 223* 216*  BUN 18 15  CREATININE 1.05 0.92  CALCIUM 9.1 9.1    PT/INR: No results for input(s): LABPROT, INR in the last 72 hours. ABG    Component Value Date/Time   PHART 7.356 03/13/2020 2011   HCO3 22.8 03/13/2020 2011   TCO2 24 03/13/2020 2011   ACIDBASEDEF 3.0 (H) 03/13/2020 2011   O2SAT 93.0 03/13/2020 2011   CBG (last  3)  Recent Labs    03/18/20 1635 03/18/20 2111 03/19/20 0609  GLUCAP 219* 157* 180*    Assessment/Plan: S/P Procedure(s) (LRB): CORONARY ARTERY BYPASS GRAFTING (CABG) times five using bilateral Internal mammary arteries and left radial artery (N/A) RADIAL ARTERY HARVEST, (Left) TRANSESOPHAGEAL ECHOCARDIOGRAM (TEE) (N/A)  1. CV- NSR, BP elevated at times- continue lopressor, isordil, will start low dose Cozaar 2. Pulm- no acute issues, off oxygen, continue IS 3. Renal- creatinine stable, weight is below baseline, will stop IV lasix, start oral at 40 mg daily 4. Neuro- bilateral hand parasthesia--- likely related to harvest in bilateral IMA, brachial plexus inflammation.. should improve with time..If these do not resolve will refer for outpatient follow up  5. DM- sugars are elevated at times.Marland Kitchen on liquid diet, will transition to carb modified 6. Deconditioning- continue PT/OT.... needs SNF 7. Dispo- patient stable, advance diet, transition to oral Lasix, he is ready for SNF bed once available   LOS: 14 days    Ellwood Handler, PA-C  03/19/2020  progressing well after CABG   Agree withPlan for SNF patient examined and medical record reviewed,agree with above note. Tharon Aquas Trigt III 03/19/2020

## 2020-03-19 NOTE — Progress Notes (Signed)
Placed a call to [facility] [phone] to give report but no one is picking up the phone. To try later again. All set , awaiting transport.

## 2020-03-19 NOTE — Progress Notes (Signed)
Second call placed to [facility] but no one is answering the phone.

## 2020-03-19 NOTE — Progress Notes (Signed)
Pt with discharged order to [facility]. V/S stable. Placed a call to [facility] 4x with no success; no one answers the call. Discharged pt via [facility]. No c/o any discomfort or pain. Transported without complications.

## 2020-03-20 NOTE — Progress Notes (Signed)
Pt not appropriate for CRP II at time of discharge due to lack of ability to ambulate. Cardiology can refer at a later date if pt becomes appropriate.   Rufina Falco, RN BSN 03/20/2020 7:18 AM

## 2020-03-23 ENCOUNTER — Other Ambulatory Visit: Payer: Self-pay | Admitting: Cardiothoracic Surgery

## 2020-03-23 DIAGNOSIS — Z951 Presence of aortocoronary bypass graft: Secondary | ICD-10-CM

## 2020-03-26 ENCOUNTER — Other Ambulatory Visit: Payer: Self-pay

## 2020-03-26 ENCOUNTER — Ambulatory Visit: Payer: Self-pay | Admitting: Cardiothoracic Surgery

## 2020-03-26 ENCOUNTER — Ambulatory Visit (INDEPENDENT_AMBULATORY_CARE_PROVIDER_SITE_OTHER): Payer: Self-pay | Admitting: Cardiothoracic Surgery

## 2020-03-26 ENCOUNTER — Ambulatory Visit
Admission: RE | Admit: 2020-03-26 | Discharge: 2020-03-26 | Disposition: A | Discharge status: Home / Self Care | Payer: Medicaid Other | Source: Ambulatory Visit | Attending: Cardiothoracic Surgery | Admitting: Cardiothoracic Surgery

## 2020-03-26 VITALS — BP 98/62 | HR 73 | Temp 97.8°F | Resp 20 | Ht 69.0 in | Wt 275.0 lb

## 2020-03-26 DIAGNOSIS — I2510 Atherosclerotic heart disease of native coronary artery without angina pectoris: Secondary | ICD-10-CM

## 2020-03-26 DIAGNOSIS — Z951 Presence of aortocoronary bypass graft: Secondary | ICD-10-CM

## 2020-03-26 IMAGING — DX DG CHEST 2V
2 series · 2 of 2 positions shown · non-contrast
Comparison: [DATE].

CLINICAL DATA: Status post CABG surgery on [DATE].

EXAM:
CHEST - 2 VIEW

[dg chest 2 view (1 of 2)]
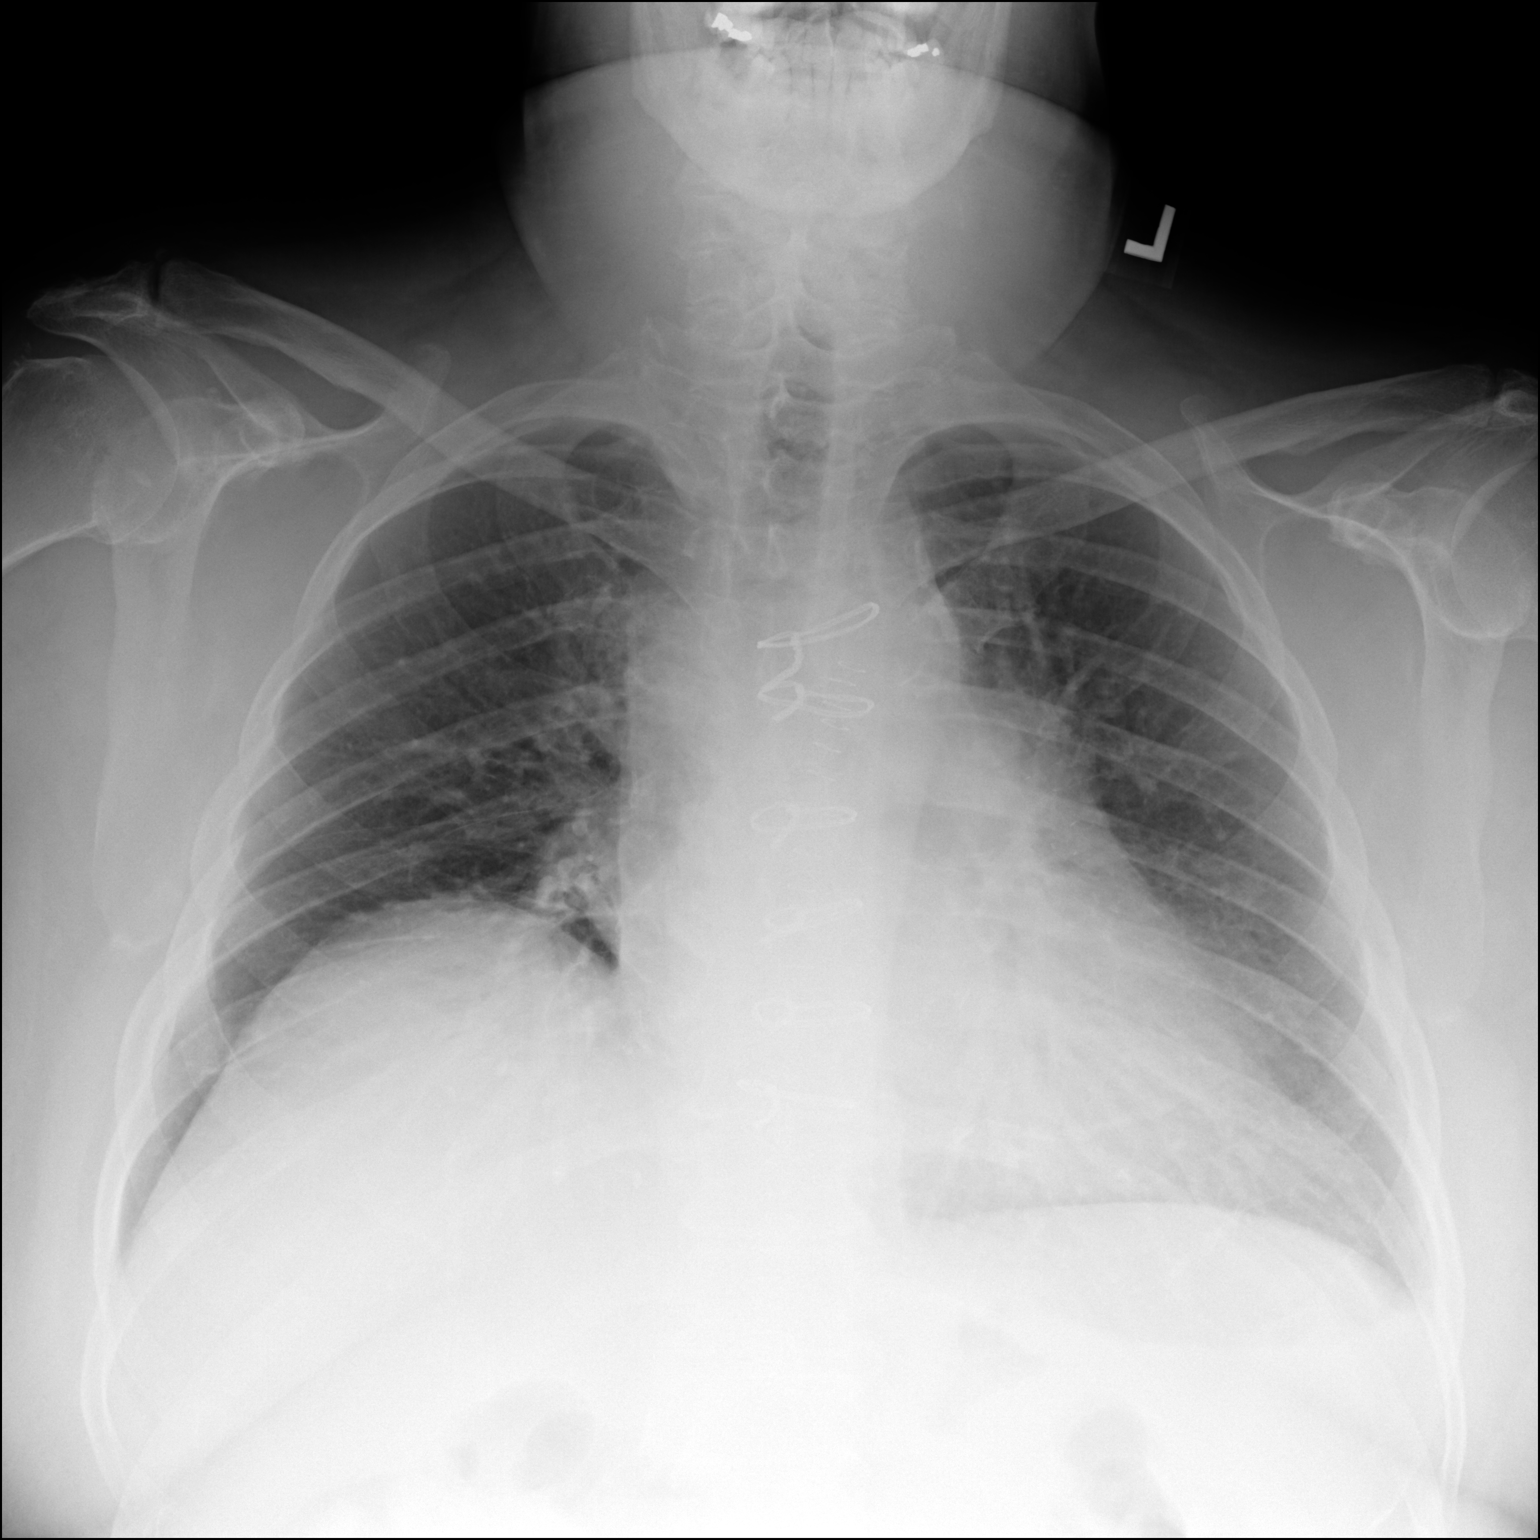

[dg chest 2 view (2 of 2)]
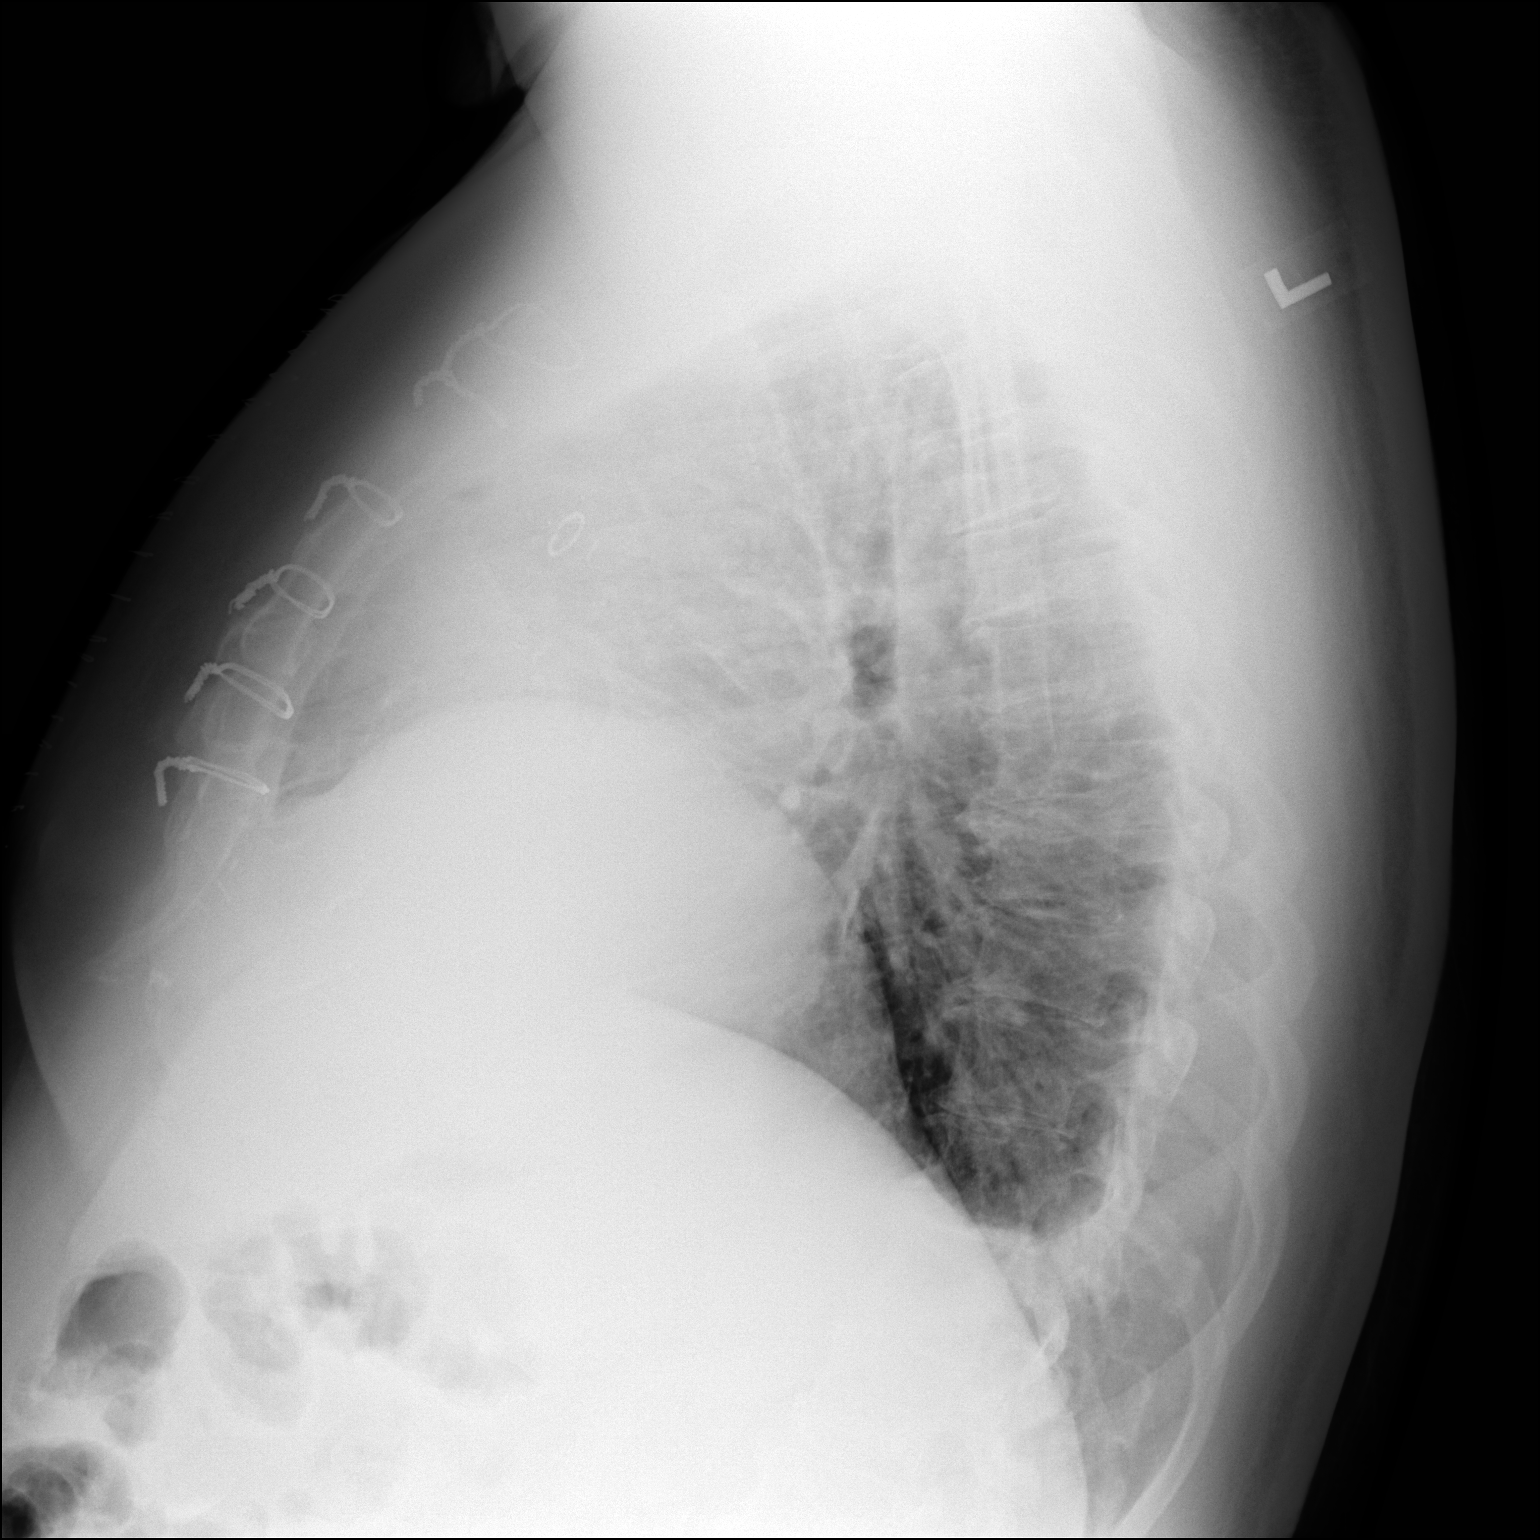

[2 of 2 positions shown; findings below may reference images not displayed]

FINDINGS: Cardiac silhouette is normal in size. Stable changes from the prior
median sternotomy. No mediastinal widening. No mediastinal or hilar
masses.

Clear lungs.  Eventrated right hemidiaphragm is unchanged.

No pleural effusion and no pneumothorax.

Skeletal structures intact.
IMPRESSION: No acute cardiopulmonary disease.

## 2020-03-26 NOTE — Progress Notes (Signed)
Cardiology Office Note    Date:  04/02/2020   ID:  Shane Chambers, DOB [birth year], MRN [id number]  PCP:  [name], MD  Cardiologist: Carlyle Dolly, MD EPS: None  Chief Complaint  Patient presents with  . Hospitalization Follow-up    History of Present Illness:  Shane Chambers is a 59 y.o. male with history of hypertension, CAD diabetes mellitus who presented to [facility] with extreme shortness of breath/NSTEMI EF 20 to 25% on echo cath 03/07/2020 severe CAD and depressed LV function.  He underwent CABG times 5-03/13/20 with LIMA to the LAD RIMA to the right posterior lateral, left radial artery to OM1, ramus intermediate, and diagonal. Intraop echo 40%   Patient comes in for f/u. Saw Dr.Atkins 03/26/20 and dressings changed. He's at a rehab in [facility] but hoping to go home in the next 2 weeks.  Walking with a walker and in a wheelchair.  Has some edema.  Denies chest pain or shortness of breath.  Past Medical History:  Diagnosis Date  . Depression   . Diabetes mellitus without complication ([place])   . Hyperlipidemia 12/01/2019  . Hypertension   . Type 2 diabetes mellitus ([place])     Past Surgical History:  Procedure Laterality Date  . CORONARY ARTERY BYPASS GRAFT N/A 03/13/2020   Procedure: CORONARY ARTERY BYPASS GRAFTING (CABG) times five using bilateral Internal mammary arteries and left radial artery;  Surgeon: [name], MD;  Location: [facility];  Service: Open Heart Surgery;  Laterality: N/A;  . DENTAL SURGERY    . RADIAL ARTERY HARVEST Left 03/13/2020   Procedure: RADIAL ARTERY HARVEST,;  Surgeon: [name], MD;  Location: [facility];  Service: Open Heart Surgery;  Laterality: Left;  . RIGHT/LEFT HEART CATH AND CORONARY ANGIOGRAPHY N/A 03/07/2020   Procedure: RIGHT/LEFT HEART CATH AND CORONARY ANGIOGRAPHY;  Surgeon: Sherren Mocha, MD;  Location: [facility];  Service: Cardiovascular;  Laterality: N/A;  . TEE WITHOUT CARDIOVERSION N/A 03/13/2020   Procedure: TRANSESOPHAGEAL ECHOCARDIOGRAM (TEE);  Surgeon: [name], MD;  Location: [facility];  Service: Open Heart Surgery;  Laterality: N/A;    Current Medications: Current Meds  Medication Sig  . albuterol (VENTOLIN HFA) 108 (90 Base) MCG/ACT inhaler Inhale 1-2 puffs into the lungs every 6 (six) hours as needed for wheezing or shortness of breath.   Marland Kitchen alum & mag hydroxide-simeth (MAALOX PLUS) 400-400-40 MG/5ML suspension Take 30 mLs by mouth as needed for indigestion.  Marland Kitchen aspirin 81 MG EC tablet Take 81 mg by mouth daily.   Marland Kitchen atorvastatin (LIPITOR) 80 MG tablet Take 1 tablet (80 mg total) by mouth at bedtime.  . calcium carbonate (OS-CAL - DOSED IN MG OF ELEMENTAL CALCIUM) 1250 (500 Ca) MG tablet Take 1 tablet by mouth daily.  . clopidogrel (PLAVIX) 75 MG tablet Take 1 tablet (75 mg total) by mouth daily.  Marland Kitchen docusate sodium (COLACE) 100 MG capsule Take 1 capsule (100 mg total) by mouth 2 (two) times daily.  Marland Kitchen doxycycline (VIBRA-TABS) 100 MG tablet Take 100 mg by mouth 2 (two) times daily.  . fluticasone (VERAMYST) 27.5 MCG/SPRAY nasal spray Place 2 sprays into the nose daily as needed for rhinitis or allergies.   . furosemide (LASIX) 20 MG tablet Take 1 tablet (20 mg total) by mouth daily. Take an extra 20mg  by mouth if noted to have greater than 3 lb weight gain in 24 hours.  . gabapentin (NEURONTIN) 300 MG capsule Take 900 mg by  mouth 2 (two) times daily.   . insulin aspart protamine- aspart (NOVOLOG MIX 70/30) (70-30) 100 UNIT/ML injection Inject 0.55 mLs (55 Units total) into the skin 2 (two) times daily with a meal.  . losartan (COZAAR) 25 MG tablet Take 1 tablet (25 mg total) by mouth daily.  Marland Kitchen MAGNESIUM PO Take 400 mg by mouth daily.   . metFORMIN (GLUCOPHAGE) 1000 MG tablet Take 1,000 mg by mouth 2 (two) times daily with a meal.  . metoprolol tartrate (LOPRESSOR) 25 MG tablet Take 1 tablet (25 mg total) by mouth 2 (two) times daily.  . Multiple Vitamins-Minerals (TAB-A-VITE) TABS  Take 1 tablet by mouth daily.  Marland Kitchen omeprazole (PRILOSEC) 40 MG capsule Take 40 mg by mouth daily.  . polyethylene glycol (MIRALAX / GLYCOLAX) 17 g packet Take 17 g by mouth daily as needed for mild constipation.  . potassium chloride (KLOR-CON) 10 MEQ tablet Take 1 tablet (10 mEq total) by mouth daily.  . tamsulosin (FLOMAX) 0.4 MG CAPS capsule Take 0.4 mg by mouth daily.  . traMADol (ULTRAM) 50 MG tablet Take 1-2 tablets (50-100 mg total) by mouth every 4 (four) hours as needed for moderate pain.  Marland Kitchen venlafaxine XR (EFFEXOR-XR) 150 MG 24 hr capsule Take 150 mg by mouth daily with breakfast.     Allergies:   Ace inhibitors, Other, and Penicillins   Social History   Socioeconomic History  . Marital status: Single    Spouse name: Not on file  . Number of children: Not on file  . Years of education: Not on file  . Highest education level: Not on file  Occupational History  . Not on file  Tobacco Use  . Smoking status: Former Research scientist (life sciences)  . Smokeless tobacco: Never Used  Substance and Sexual Activity  . Alcohol use: Yes    Comment: rarely  . Drug use: No  . Sexual activity: Not on file  Other Topics Concern  . Not on file  Social History Narrative  . Not on file   Social Determinants of Health   Financial Resource Strain:   . Difficulty of Paying Living Expenses:   Food Insecurity:   . Worried About Charity fundraiser in the Last Year:   . Arboriculturist in the Last Year:   Transportation Needs:   . Film/video editor (Medical):   Marland Kitchen Lack of Transportation (Non-Medical):   Physical Activity:   . Days of Exercise per Week:   . Minutes of Exercise per Session:   Stress:   . Feeling of Stress :   Social Connections:   . Frequency of Communication with Friends and Family:   . Frequency of Social Gatherings with Friends and Family:   . Attends Religious Services:   . Active Member of Clubs or Organizations:   . Attends Archivist Meetings:   Marland Kitchen Marital Status:        Family History:  The patient's family history includes Hypertension in his mother.   ROS:   Please see the history of present illness.    ROS All other systems reviewed and are negative.   PHYSICAL EXAM:   VS:  BP (!) 114/58   Pulse 74   Ht 5\' 9"  (1.753 m)   Wt 275 lb (124.7 kg)   SpO2 96%   BMI 40.61 kg/m   Physical Exam  GEN: Obese, in no acute distress  Neck: no JVD, carotid bruits, or masses Cardiac:RRR; no murmurs, rubs, or gallops  Respiratory:  clear to auscultation bilaterally, normal work of breathing GI: soft, nontender, nondistended, + BS Ext: Trace of edema in his feet otherwise without cyanosis, clubbing.Good distal pulses bilaterally Neuro:  Alert and Oriented x 3 Psych: euthymic mood, full affect  Wt Readings from Last 3 Encounters:  04/02/20 275 lb (124.7 kg)  03/26/20 275 lb (124.7 kg)  03/19/20 274 lb 14.6 oz (124.7 kg)      Studies/Labs Reviewed:   EKG:  EKG is not ordered today.   Recent Labs: 03/05/2020: B Natriuretic Peptide 463.0; TSH 3.350 03/13/2020: ALT 30 03/14/2020: Magnesium 2.2 03/19/2020: BUN 10; Creatinine, Ser 0.90; Hemoglobin 11.0; Platelets 404; Potassium 3.8; Sodium 134   Lipid Panel    Component Value Date/Time   CHOL 157 03/08/2020 0802   TRIG 144 03/08/2020 0802   HDL 36 (L) 03/08/2020 0802   CHOLHDL 4.4 03/08/2020 0802   VLDL 29 03/08/2020 0802   LDLCALC 92 03/08/2020 0802    Additional studies/ records that were reviewed today include:  Echocardiogram 03/06/20: 1. Left ventricular ejection fraction, by estimation, is 20 to 25%. The  left ventricle has severely decreased function. The left ventricle  demonstrates global hypokinesis. There is moderate left ventricular  hypertrophy. Left ventricular diastolic  parameters are consistent with Grade III diastolic dysfunction  (restrictive).   2. Right ventricular systolic function is low normal. The right  ventricular size is normal. Tricuspid regurgitation signal is  inadequate  for assessing PA pressure.   3. Left atrial size was mildly dilated.   4. The mitral valve is grossly normal. Mild mitral valve regurgitation.   5. The aortic valve was not well visualized. Aortic valve regurgitation  is not visualized. Mild aortic valve sclerosis is present, with no  evidence of aortic valve stenosis.   6. IVC dilated but respirophasic change not well visualized.   R/LHC 03/07/20:  Prox RCA lesion is 99% stenosed.  Prox RCA to Mid RCA lesion is 40% stenosed.  Dist RCA lesion is 30% stenosed.  Prox LAD to Mid LAD lesion is 80% stenosed.  1st Diag lesion is 60% stenosed.  Mid LM to Dist LM lesion is 40% stenosed.  Prox Cx to Mid Cx lesion is 95% stenosed.   1. 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 Moderately elevated LV/right heart filling pressures c/w known diagnosis of heart failure 3. Known severe LV dysfunction by echo   Recommend: review revascularization options. Pt has surgical anatomy, but not sure he will be a candidate for CABG. High-risk multivessel PCI could be considered if he is not a candidate for surgery.     ASSESSMENT:    1. Coronary artery disease involving coronary bypass graft of native heart without angina pectoris   2. Ischemic cardiomyopathy   3. Chronic combined systolic and diastolic CHF (congestive heart failure) ([place])   4. Essential hypertension   5. Hyperlipidemia, unspecified hyperlipidemia type   6. Type 2 diabetes mellitus with other circulatory complication, with long-term current use of insulin ([facility])      PLAN:  In order of problems listed above:  CAD status post NSTEMI followed by CABG x5-03-13-20 on Plavix, aspirin and Lipitor-recommend cardiac rehab after he completes inpatient rehab  Ischemic cardiomyopathy ejection fraction 20 to 25% with grade 3 DD on echo.  Intra-Op TEE  ejection fraction 40%.  On metoprolol .  Question of angioedema on lisinopril in the  past so no ACEI but tolerating losartan.  Labs 03/19/2020 reviewed and stable.  Can get follow-up echo in 3 months.  Chronic combined systolic and diastolic CHF overall stable on low-dose Lasix 20 mg daily potassium 10 mEq daily, renal function stable.  2 g sodium diet discussed as well as daily weights.  Essential hypertension blood pressure controlled  Hyperlipidemia LDL 92, atorvastatin increased from 40 to 80 mg-follow-up in 2 months when he sees Dr. Harl Bowie back  DM type II hemoglobin A1c 10.2 managed by PCP  Medication Adjustments/Labs and Tests Ordered: Current medicines are reviewed at length with the patient today.  Concerns regarding medicines are outlined above.  Medication changes, Labs and Tests ordered today are listed in the Patient Instructions below. Patient Instructions  Medication Instructions:   Take an extra Lasix tablet as needed for weight gain of 2-3 lbs overnight.  *If you need a refill on your cardiac medications before your next appointment, please call your pharmacy*   Follow-Up: At [facility], you and your health needs are our priority.  As part of our continuing mission to provide you with exceptional heart care, we have created designated Provider Care Teams.  These Care Teams include your primary Cardiologist (physician) and Advanced Practice Providers (APPs -  Physician Assistants and Nurse Practitioners) who all work together to provide you with the care you need, when you need it.  We recommend signing up for the patient portal called "MyChart".  Sign up information is provided on this After Visit Summary.  MyChart is used to connect with patients for Virtual Visits (Telemedicine).  Patients are able to view lab/test results, encounter notes, upcoming appointments, etc.  Non-urgent messages can be sent to your provider as well.   To learn more about what you can do with  MyChart, go to [email or web address].    Your next appointment:   2 month(s)  The format for your next appointment:   In Person  Provider:   Carlyle Dolly, MD   Other Instructions  Your physician recommends that you weigh, daily, at the same time every day, and in the same amount of clothing. Please record your daily weights on the handout provided and bring it to your next appointment.    Low-Sodium Eating Plan Sodium, which is an element that makes up salt, helps you maintain a healthy balance of fluids in your body. Too much sodium can increase your blood pressure and cause fluid and waste to be held in your body. Your health care provider or dietitian may recommend following this plan if you have high blood pressure (hypertension), kidney disease, liver disease, or heart failure. Eating less sodium can help lower your blood pressure, reduce swelling, and protect your heart, liver, and kidneys. What are tips for following this plan? General guidelines  Most people on this plan should limit their sodium intake to 2,000 mg (milligrams) of sodium each day. Reading food labels   The Nutrition Facts label lists the amount of sodium in one serving of the food. If you eat more than one serving, you must multiply the listed amount of sodium by the number of servings.  Choose foods with less than 140 mg of sodium per serving.  Avoid foods with 300 mg of sodium or more per serving. Shopping  Look for lower-sodium products, often labeled as "low-sodium" or "no salt added."  Always check the sodium content even if foods are labeled as "unsalted" or "no salt added".  Buy fresh foods. ? Avoid canned  foods and premade or frozen meals. ? Avoid canned, cured, or processed meats  Buy breads that have less than 80 mg of sodium per slice. Cooking  Eat more home-cooked food and less restaurant, buffet, and fast food.  Avoid adding salt when cooking. Use salt-free seasonings or herbs  instead of table salt or sea salt. Check with your health care provider or pharmacist before using salt substitutes.  Cook with plant-based oils, such as canola, sunflower, or olive oil. Meal planning  When eating at a restaurant, ask that your food be prepared with less salt or no salt, if possible.  Avoid foods that contain MSG (monosodium glutamate). MSG is sometimes added to [place] food, bouillon, and some canned foods. What foods are recommended? The items listed may not be a complete list. Talk with your dietitian about what dietary choices are best for you. Grains Low-sodium cereals, including oats, puffed wheat and rice, and shredded wheat. Low-sodium crackers. Unsalted rice. Unsalted pasta. Low-sodium bread. Whole-grain breads and whole-grain pasta. Vegetables Fresh or frozen vegetables. "No salt added" canned vegetables. "No salt added" tomato sauce and paste. Low-sodium or reduced-sodium tomato and vegetable juice. Fruits Fresh, frozen, or canned fruit. Fruit juice. Meats and other protein foods Fresh or frozen (no salt added) meat, poultry, seafood, and fish. Low-sodium canned tuna and salmon. Unsalted nuts. Dried peas, beans, and lentils without added salt. Unsalted canned beans. Eggs. Unsalted nut butters. Dairy Milk. Soy milk. Cheese that is naturally low in sodium, such as ricotta cheese, fresh mozzarella, or Swiss cheese Low-sodium or reduced-sodium cheese. Cream cheese. Yogurt. Fats and oils Unsalted butter. Unsalted margarine with no trans fat. Vegetable oils such as canola or olive oils. Seasonings and other foods Fresh and dried herbs and spices. Salt-free seasonings. Low-sodium mustard and ketchup. Sodium-free salad dressing. Sodium-free light mayonnaise. Fresh or refrigerated horseradish. Lemon juice. Vinegar. Homemade, reduced-sodium, or low-sodium soups. Unsalted popcorn and pretzels. Low-salt or salt-free chips. What foods are not recommended? The items listed may  not be a complete list. Talk with your dietitian about what dietary choices are best for you. Grains Instant hot cereals. Bread stuffing, pancake, and biscuit mixes. Croutons. Seasoned rice or pasta mixes. Noodle soup cups. Boxed or frozen macaroni and cheese. Regular salted crackers. Self-rising flour. Vegetables Sauerkraut, pickled vegetables, and relishes. Olives. [place] fries. Onion rings. Regular canned vegetables (not low-sodium or reduced-sodium). Regular canned tomato sauce and paste (not low-sodium or reduced-sodium). Regular tomato and vegetable juice (not low-sodium or reduced-sodium). Frozen vegetables in sauces. Meats and other protein foods Meat or fish that is salted, canned, smoked, spiced, or pickled. Bacon, ham, sausage, hotdogs, corned beef, chipped beef, packaged lunch meats, salt pork, jerky, pickled herring, anchovies, regular canned tuna, sardines, salted nuts. Dairy Processed cheese and cheese spreads. Cheese curds. Blue cheese. Feta cheese. String cheese. Regular cottage cheese. Buttermilk. Canned milk. Fats and oils Salted butter. Regular margarine. Ghee. Bacon fat. Seasonings and other foods Onion salt, garlic salt, seasoned salt, table salt, and sea salt. Canned and packaged gravies. Worcestershire sauce. Tartar sauce. Barbecue sauce. Teriyaki sauce. Soy sauce, including reduced-sodium. Steak sauce. Fish sauce. Oyster sauce. Cocktail sauce. Horseradish that you find on the shelf. Regular ketchup and mustard. Meat flavorings and tenderizers. Bouillon cubes. Hot sauce and Tabasco sauce. Premade or packaged marinades. Premade or packaged taco seasonings. Relishes. Regular salad dressings. Salsa. Potato and tortilla chips. Corn chips and puffs. Salted popcorn and pretzels. Canned or dried soups. Pizza. Frozen entrees and pot pies. Summary  Eating less  sodium can help lower your blood pressure, reduce swelling, and protect your heart, liver, and kidneys.  Most people on this  plan should limit their sodium intake to 1,500-2,000 mg (milligrams) of sodium each day.  Canned, boxed, and frozen foods are high in sodium. Restaurant foods, fast foods, and pizza are also very high in sodium. You also get sodium by adding salt to food.  Try to cook at home, eat more fresh fruits and vegetables, and eat less fast food, canned, processed, or prepared foods. This information is not intended to replace advice given to you by your health care provider. Make sure you discuss any questions you have with your health care provider. Document Revised: 10/09/2017 Document Reviewed: 10/20/2016 Elsevier Patient Education  2020 [place], Ermalinda Barrios, [place]  04/02/2020 1:13 PM    [facility] [address], [place], [place]  [address] Phone: [phone]; Fax: [phone]

## 2020-03-27 NOTE — Progress Notes (Signed)
[address]       [place] [address]             [phone]     CARDIOTHORACIC SURGERY OFFICE NOTE  Referring Provider is Branch, Alphonse Guild, MD Primary Cardiologist is Carlyle Dolly, MD PCP is [name], MD   HPI:  59 yo man underwent CABG on 03/13/20 for low EF. He did well after surgery. Presents for 1st visit after discharge to a SNF. No complaints of SOB or angina.     Current Outpatient Medications  Medication Sig Dispense Refill  . albuterol (VENTOLIN HFA) 108 (90 Base) MCG/ACT inhaler Inhale 1-2 puffs into the lungs every 6 (six) hours as needed for wheezing or shortness of breath.     Marland Kitchen aspirin 81 MG EC tablet Take 81 mg by mouth daily.     Marland Kitchen atorvastatin (LIPITOR) 80 MG tablet Take 1 tablet (80 mg total) by mouth at bedtime.    . calcium carbonate (OS-CAL - DOSED IN MG OF ELEMENTAL CALCIUM) 1250 (500 Ca) MG tablet Take 1 tablet by mouth daily.    . clopidogrel (PLAVIX) 75 MG tablet Take 1 tablet (75 mg total) by mouth daily.    Marland Kitchen docusate sodium (COLACE) 100 MG capsule Take 1 capsule (100 mg total) by mouth 2 (two) times daily. (Patient taking differently: Take 100 mg by mouth 2 (two) times daily as needed. ) 10 capsule 0  . fluticasone (VERAMYST) 27.5 MCG/SPRAY nasal spray Place 2 sprays into the nose daily as needed for rhinitis or allergies.     . furosemide (LASIX) 20 MG tablet Take 1 tablet (20 mg total) by mouth daily. Take an extra 20mg  by mouth if noted to have greater than 3 lb weight gain in 24 hours. (Patient taking differently: Take 20 mg by mouth 2 (two) times daily as needed. Take an extra 20mg  by mouth if noted to have greater than 3 lb weight gain in 24 hours.) 30 tablet 11  . gabapentin (NEURONTIN) 300 MG capsule Take 900 mg by mouth 2 (two) times daily.     . insulin aspart protamine- aspart (NOVOLOG MIX 70/30) (70-30) 100 UNIT/ML injection Inject 0.55 mLs (55 Units total) into the skin 2 (two) times daily with a meal. (Patient  taking differently: Inject 65 Units into the skin 2 (two) times daily with a meal. ) 10 mL 11  . Iron, Ferrous Sulfate, 325 (65 Fe) MG TABS Take 1 tablet by mouth daily. 30 tablet 1  . isosorbide dinitrate (ISORDIL) 10 MG tablet Take 1 tablet (10 mg total) by mouth 3 (three) times daily. For 30 days    . losartan (COZAAR) 25 MG tablet Take 1 tablet (25 mg total) by mouth daily.    Marland Kitchen MAGNESIUM PO Take 1 tablet by mouth daily.    . metFORMIN (GLUCOPHAGE) 1000 MG tablet Take 1,000 mg by mouth 2 (two) times daily with a meal.    . metoprolol tartrate (LOPRESSOR) 25 MG tablet Take 1 tablet (25 mg total) by mouth 2 (two) times daily.    . Multiple Vitamins-Minerals (TAB-A-VITE) TABS Take 1 tablet by mouth daily.    Marland Kitchen omeprazole (PRILOSEC) 40 MG capsule Take 40 mg by mouth daily.    . polyethylene glycol (MIRALAX / GLYCOLAX) 17 g packet Take 17 g by mouth daily as needed for mild constipation. 14 each 0  . tamsulosin (FLOMAX) 0.4 MG CAPS capsule Take 0.4 mg by mouth daily.    Marland Kitchen  traMADol (ULTRAM) 50 MG tablet Take 1-2 tablets (50-100 mg total) by mouth every 4 (four) hours as needed for moderate pain. 30 tablet 0  . venlafaxine XR (EFFEXOR-XR) 150 MG 24 hr capsule Take 150 mg by mouth daily with breakfast.    . potassium chloride (KLOR-CON) 10 MEQ tablet Take 1 tablet (10 mEq total) by mouth daily. 30 tablet 2   No current facility-administered medications for this visit.      Physical Exam:   BP 98/62   Pulse 73   Temp 97.8 F (36.6 C) (Skin)   Resp 20   Ht 5\' 9"  (1.753 m)   Wt 124.7 kg   SpO2 97% Comment: RA  BMI 40.61 kg/m   General:  Well-appearing, NAD  Chest:   cta  CV:   rrr  Incisions:  Mild drainage, no sternal clicking  Abdomen:  sntnd  Extremities:  Mild edema  Diagnostic Tests:  CXR with clear lung fields   Impression:  Doing reasonably well after CABG for low EF  Plan:  Continue present management F/u in 2 weeks Paint wound with betadine and cover with clean  gauze BID  I spent in excess of 20 minutes during the conduct of this office consultation and >50% of this time involved direct face-to-face encounter with the patient for counseling and/or coordination of their care.  Level 2                 10 minutes Level 3                 15 minutes Level 4                 25 minutes Level 5                 40 minutes  B. Murvin Natal, MD 03/27/2020 5:29 PM

## 2020-04-02 ENCOUNTER — Ambulatory Visit (INDEPENDENT_AMBULATORY_CARE_PROVIDER_SITE_OTHER): Payer: Medicaid Other | Admitting: Physician Assistant

## 2020-04-02 ENCOUNTER — Encounter: Payer: Self-pay | Admitting: Physician Assistant

## 2020-04-02 ENCOUNTER — Other Ambulatory Visit: Payer: Self-pay

## 2020-04-02 VITALS — BP 114/58 | HR 74 | Ht 69.0 in | Wt 275.0 lb

## 2020-04-02 DIAGNOSIS — I5042 Chronic combined systolic (congestive) and diastolic (congestive) heart failure: Secondary | ICD-10-CM

## 2020-04-02 DIAGNOSIS — I25810 Atherosclerosis of coronary artery bypass graft(s) without angina pectoris: Secondary | ICD-10-CM

## 2020-04-02 DIAGNOSIS — I255 Ischemic cardiomyopathy: Secondary | ICD-10-CM

## 2020-04-02 DIAGNOSIS — E1159 Type 2 diabetes mellitus with other circulatory complications: Secondary | ICD-10-CM

## 2020-04-02 DIAGNOSIS — I10 Essential (primary) hypertension: Secondary | ICD-10-CM | POA: Diagnosis not present

## 2020-04-02 DIAGNOSIS — Z794 Long term (current) use of insulin: Secondary | ICD-10-CM

## 2020-04-02 DIAGNOSIS — E785 Hyperlipidemia, unspecified: Secondary | ICD-10-CM

## 2020-04-02 NOTE — Patient Instructions (Signed)
Medication Instructions:   Take an extra Lasix tablet as needed for weight gain of 2-3 lbs overnight.  *If you need a refill on your cardiac medications before your next appointment, please call your pharmacy*   Follow-Up: At [facility], you and your health needs are our priority.  As part of our continuing mission to provide you with exceptional heart care, we have created designated Provider Care Teams.  These Care Teams include your primary Cardiologist (physician) and Advanced Practice Providers (APPs -  Physician Assistants and Nurse Practitioners) who all work together to provide you with the care you need, when you need it.  We recommend signing up for the patient portal called "MyChart".  Sign up information is provided on this After Visit Summary.  MyChart is used to connect with patients for Virtual Visits (Telemedicine).  Patients are able to view lab/test results, encounter notes, upcoming appointments, etc.  Non-urgent messages can be sent to your provider as well.   To learn more about what you can do with MyChart, go to [email or web address].    Your next appointment:   2 month(s)  The format for your next appointment:   In Person  Provider:   Carlyle Dolly, MD   Other Instructions  Your physician recommends that you weigh, daily, at the same time every day, and in the same amount of clothing. Please record your daily weights on the handout provided and bring it to your next appointment.    Low-Sodium Eating Plan Sodium, which is an element that makes up salt, helps you maintain a healthy balance of fluids in your body. Too much sodium can increase your blood pressure and cause fluid and waste to be held in your body. Your health care provider or dietitian may recommend following this plan if you have high blood pressure (hypertension), kidney disease, liver disease, or heart failure. Eating less sodium can help lower your blood pressure, reduce swelling, and  protect your heart, liver, and kidneys. What are tips for following this plan? General guidelines  Most people on this plan should limit their sodium intake to 2,000 mg (milligrams) of sodium each day. Reading food labels   The Nutrition Facts label lists the amount of sodium in one serving of the food. If you eat more than one serving, you must multiply the listed amount of sodium by the number of servings.  Choose foods with less than 140 mg of sodium per serving.  Avoid foods with 300 mg of sodium or more per serving. Shopping  Look for lower-sodium products, often labeled as "low-sodium" or "no salt added."  Always check the sodium content even if foods are labeled as "unsalted" or "no salt added".  Buy fresh foods. ? Avoid canned foods and premade or frozen meals. ? Avoid canned, cured, or processed meats  Buy breads that have less than 80 mg of sodium per slice. Cooking  Eat more home-cooked food and less restaurant, buffet, and fast food.  Avoid adding salt when cooking. Use salt-free seasonings or herbs instead of table salt or sea salt. Check with your health care provider or pharmacist before using salt substitutes.  Cook with plant-based oils, such as canola, sunflower, or olive oil. Meal planning  When eating at a restaurant, ask that your food be prepared with less salt or no salt, if possible.  Avoid foods that contain MSG (monosodium glutamate). MSG is sometimes added to [place] food, bouillon, and some canned foods. What foods are recommended? The items listed may  not be a complete list. Talk with your dietitian about what dietary choices are best for you. Grains Low-sodium cereals, including oats, puffed wheat and rice, and shredded wheat. Low-sodium crackers. Unsalted rice. Unsalted pasta. Low-sodium bread. Whole-grain breads and whole-grain pasta. Vegetables Fresh or frozen vegetables. "No salt added" canned vegetables. "No salt added" tomato sauce and  paste. Low-sodium or reduced-sodium tomato and vegetable juice. Fruits Fresh, frozen, or canned fruit. Fruit juice. Meats and other protein foods Fresh or frozen (no salt added) meat, poultry, seafood, and fish. Low-sodium canned tuna and salmon. Unsalted nuts. Dried peas, beans, and lentils without added salt. Unsalted canned beans. Eggs. Unsalted nut butters. Dairy Milk. Soy milk. Cheese that is naturally low in sodium, such as ricotta cheese, fresh mozzarella, or Swiss cheese Low-sodium or reduced-sodium cheese. Cream cheese. Yogurt. Fats and oils Unsalted butter. Unsalted margarine with no trans fat. Vegetable oils such as canola or olive oils. Seasonings and other foods Fresh and dried herbs and spices. Salt-free seasonings. Low-sodium mustard and ketchup. Sodium-free salad dressing. Sodium-free light mayonnaise. Fresh or refrigerated horseradish. Lemon juice. Vinegar. Homemade, reduced-sodium, or low-sodium soups. Unsalted popcorn and pretzels. Low-salt or salt-free chips. What foods are not recommended? The items listed may not be a complete list. Talk with your dietitian about what dietary choices are best for you. Grains Instant hot cereals. Bread stuffing, pancake, and biscuit mixes. Croutons. Seasoned rice or pasta mixes. Noodle soup cups. Boxed or frozen macaroni and cheese. Regular salted crackers. Self-rising flour. Vegetables Sauerkraut, pickled vegetables, and relishes. Olives. [place] fries. Onion rings. Regular canned vegetables (not low-sodium or reduced-sodium). Regular canned tomato sauce and paste (not low-sodium or reduced-sodium). Regular tomato and vegetable juice (not low-sodium or reduced-sodium). Frozen vegetables in sauces. Meats and other protein foods Meat or fish that is salted, canned, smoked, spiced, or pickled. Bacon, ham, sausage, hotdogs, corned beef, chipped beef, packaged lunch meats, salt pork, jerky, pickled herring, anchovies, regular canned tuna, sardines,  salted nuts. Dairy Processed cheese and cheese spreads. Cheese curds. Blue cheese. Feta cheese. String cheese. Regular cottage cheese. Buttermilk. Canned milk. Fats and oils Salted butter. Regular margarine. Ghee. Bacon fat. Seasonings and other foods Onion salt, garlic salt, seasoned salt, table salt, and sea salt. Canned and packaged gravies. Worcestershire sauce. Tartar sauce. Barbecue sauce. Teriyaki sauce. Soy sauce, including reduced-sodium. Steak sauce. Fish sauce. Oyster sauce. Cocktail sauce. Horseradish that you find on the shelf. Regular ketchup and mustard. Meat flavorings and tenderizers. Bouillon cubes. Hot sauce and Tabasco sauce. Premade or packaged marinades. Premade or packaged taco seasonings. Relishes. Regular salad dressings. Salsa. Potato and tortilla chips. Corn chips and puffs. Salted popcorn and pretzels. Canned or dried soups. Pizza. Frozen entrees and pot pies. Summary  Eating less sodium can help lower your blood pressure, reduce swelling, and protect your heart, liver, and kidneys.  Most people on this plan should limit their sodium intake to 1,500-2,000 mg (milligrams) of sodium each day.  Canned, boxed, and frozen foods are high in sodium. Restaurant foods, fast foods, and pizza are also very high in sodium. You also get sodium by adding salt to food.  Try to cook at home, eat more fresh fruits and vegetables, and eat less fast food, canned, processed, or prepared foods. This information is not intended to replace advice given to you by your health care provider. Make sure you discuss any questions you have with your health care provider. Document Revised: 10/09/2017 Document Reviewed: 10/20/2016 Elsevier Patient Education  2020 [employer].

## 2020-04-13 ENCOUNTER — Other Ambulatory Visit: Payer: Self-pay | Admitting: Cardiothoracic Surgery

## 2020-04-13 DIAGNOSIS — Z951 Presence of aortocoronary bypass graft: Secondary | ICD-10-CM

## 2020-04-16 ENCOUNTER — Ambulatory Visit
Admission: RE | Admit: 2020-04-16 | Discharge: 2020-04-16 | Disposition: A | Discharge status: Home / Self Care | Payer: Medicaid Other | Source: Ambulatory Visit | Attending: Cardiothoracic Surgery | Admitting: Cardiothoracic Surgery

## 2020-04-16 ENCOUNTER — Ambulatory Visit (INDEPENDENT_AMBULATORY_CARE_PROVIDER_SITE_OTHER): Payer: Self-pay | Admitting: Cardiothoracic Surgery

## 2020-04-16 ENCOUNTER — Other Ambulatory Visit: Payer: Self-pay

## 2020-04-16 VITALS — BP 117/74 | HR 88 | Temp 98.1°F | Resp 20 | Ht 69.0 in | Wt 280.0 lb

## 2020-04-16 DIAGNOSIS — Z951 Presence of aortocoronary bypass graft: Secondary | ICD-10-CM

## 2020-04-16 DIAGNOSIS — I2510 Atherosclerotic heart disease of native coronary artery without angina pectoris: Secondary | ICD-10-CM

## 2020-04-16 IMAGING — DX DG CHEST 2V
2 series · 2 of 2 positions shown · non-contrast
Comparison: [DATE]

CLINICAL DATA: Status post CABG

EXAM:
CHEST - 2 VIEW

[dg chest 2 view (1 of 2)]
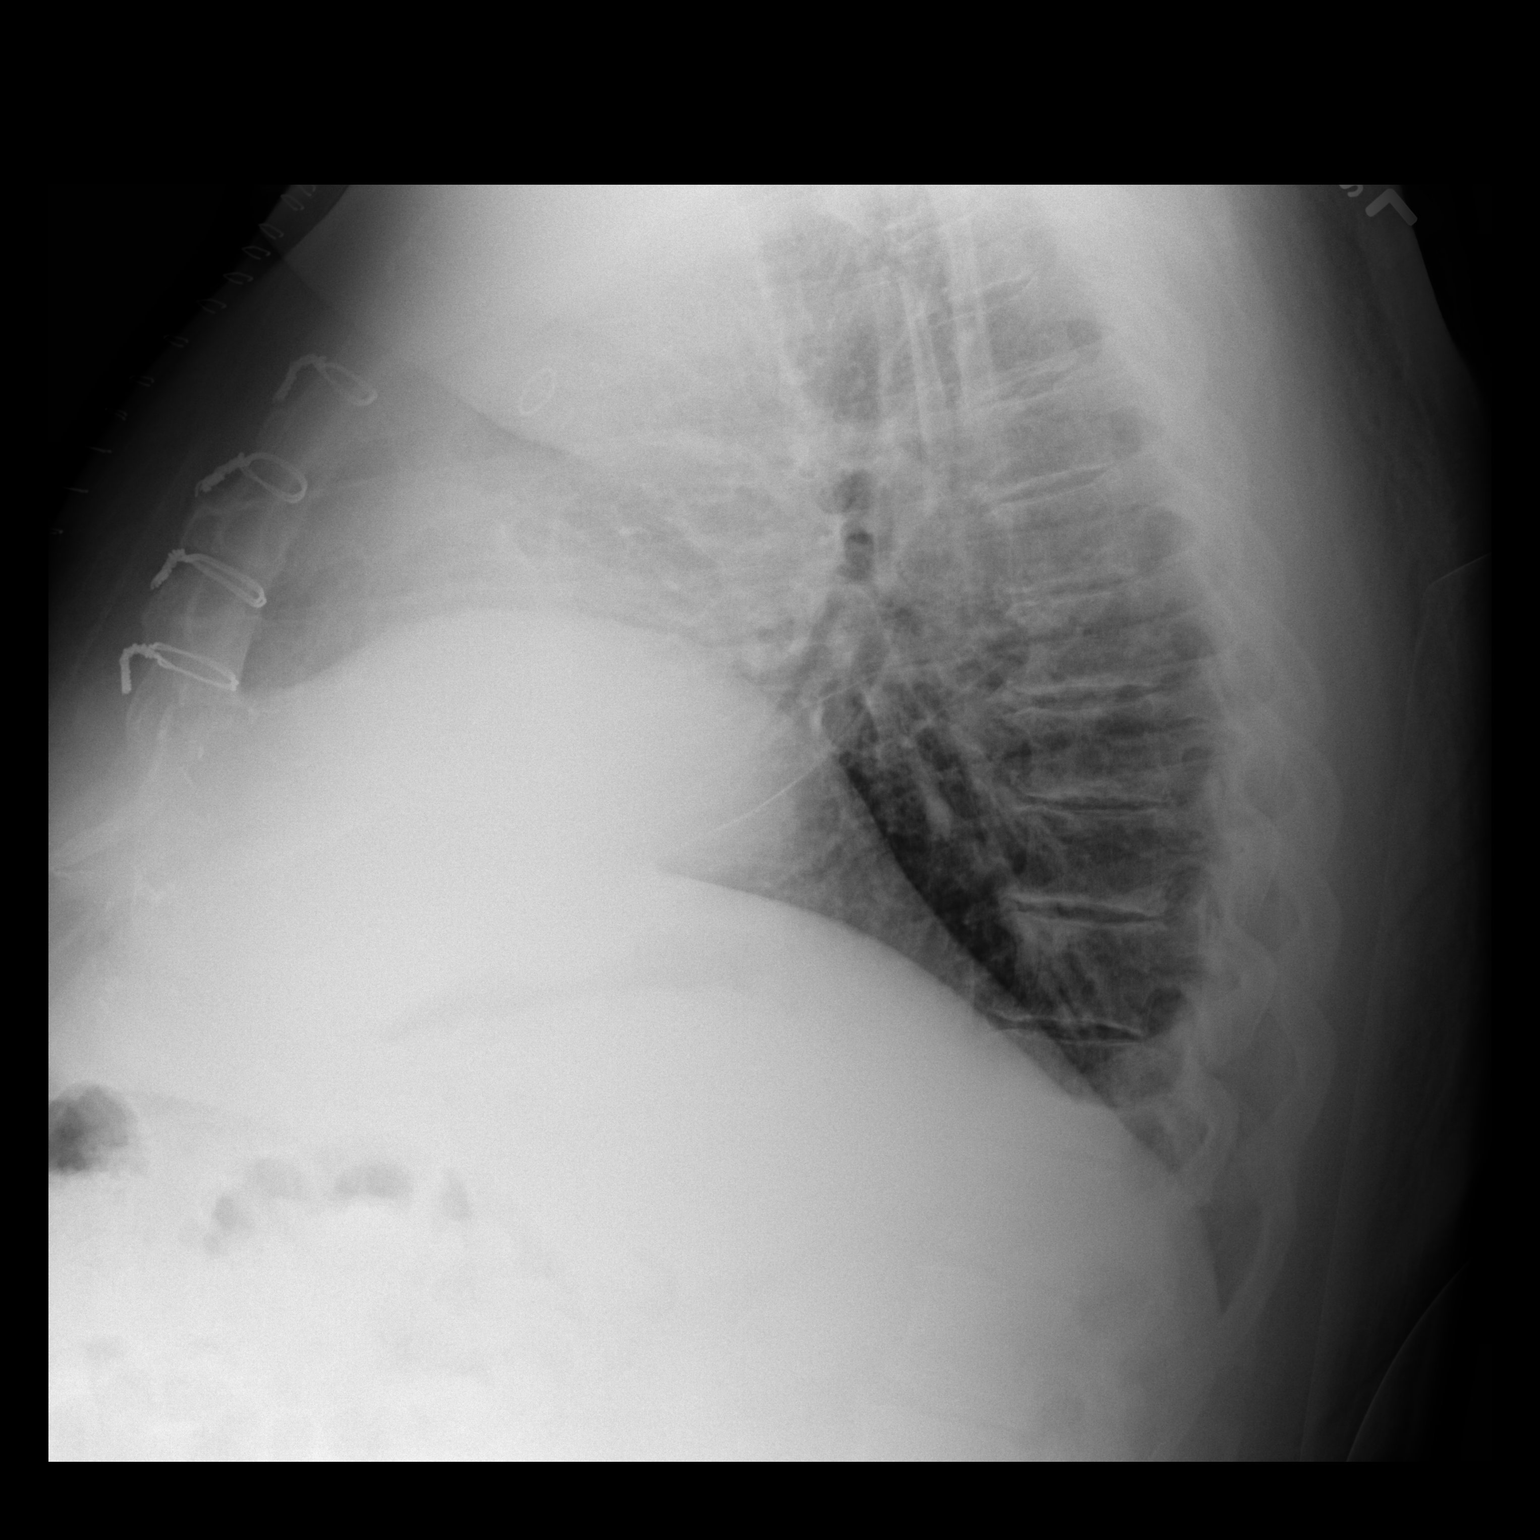

[dg chest 2 view (2 of 2)]
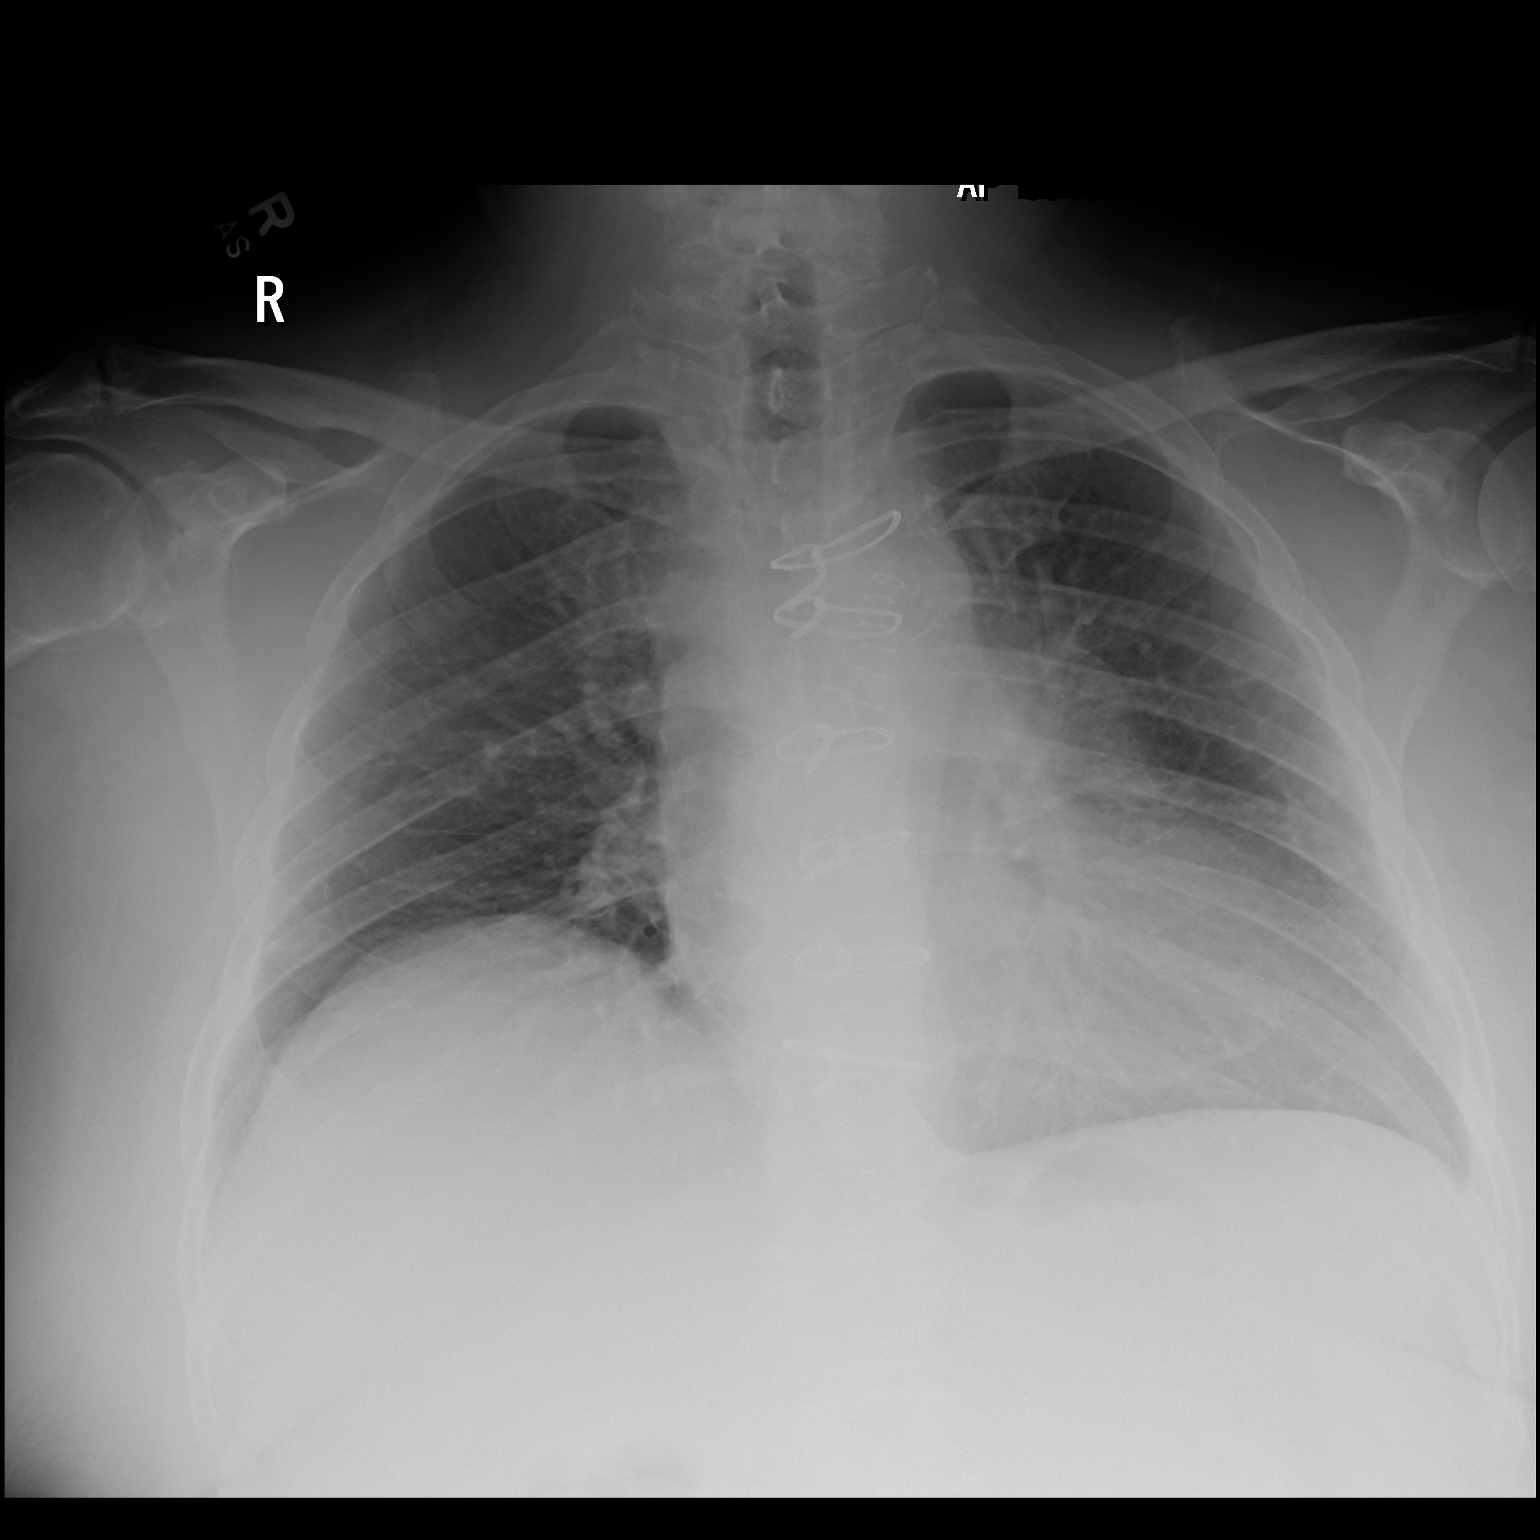

[2 of 2 positions shown; findings below may reference images not displayed]

FINDINGS: Previous median sternotomy and CABG procedure. Stable mild cardiac
enlargement. Low lung volumes with asymmetric elevation of the right
hemidiaphragm appears unchanged. No pleural effusion or edema. No
airspace densities.
IMPRESSION: 1. Low lung volumes.
2. No acute cardiopulmonary abnormalities.

## 2020-04-16 NOTE — Progress Notes (Signed)
[address]       [place] [address]             [phone]     CARDIOTHORACIC SURGERY OFFICE NOTE  Referring Provider is Branch, Alphonse Guild, MD Primary Cardiologist is Carlyle Dolly, MD PCP is [name], MD   HPI:  59 year old gentleman status post CABG approximately 1 month ago.  He has been in a rehab center since discharge.  However, he states that he is to be discharged this week.  He denies any chest pain and his breathing is unlabored.  He denies dysrhythmias.   Current Outpatient Medications  Medication Sig Dispense Refill  . albuterol (VENTOLIN HFA) 108 (90 Base) MCG/ACT inhaler Inhale 1-2 puffs into the lungs every 6 (six) hours as needed for wheezing or shortness of breath.     Marland Kitchen alum & mag hydroxide-simeth (MAALOX PLUS) 400-400-40 MG/5ML suspension Take 30 mLs by mouth as needed for indigestion.    Marland Kitchen aspirin 81 MG EC tablet Take 81 mg by mouth daily.     Marland Kitchen atorvastatin (LIPITOR) 80 MG tablet Take 1 tablet (80 mg total) by mouth at bedtime.    . calcium carbonate (OS-CAL - DOSED IN MG OF ELEMENTAL CALCIUM) 1250 (500 Ca) MG tablet Take 1 tablet by mouth daily.    . clopidogrel (PLAVIX) 75 MG tablet Take 1 tablet (75 mg total) by mouth daily.    Marland Kitchen docusate sodium (COLACE) 100 MG capsule Take 1 capsule (100 mg total) by mouth 2 (two) times daily. 10 capsule 0  . doxycycline (VIBRA-TABS) 100 MG tablet Take 100 mg by mouth 2 (two) times daily.    . fluticasone (VERAMYST) 27.5 MCG/SPRAY nasal spray Place 2 sprays into the nose daily as needed for rhinitis or allergies.     . furosemide (LASIX) 20 MG tablet Take 1 tablet (20 mg total) by mouth daily. Take an extra 20mg  by mouth if noted to have greater than 3 lb weight gain in 24 hours. 30 tablet 11  . gabapentin (NEURONTIN) 300 MG capsule Take 900 mg by mouth 2 (two) times daily.     . insulin aspart protamine- aspart (NOVOLOG MIX 70/30) (70-30) 100 UNIT/ML injection Inject 0.55 mLs (55 Units  total) into the skin 2 (two) times daily with a meal. 10 mL 11  . losartan (COZAAR) 25 MG tablet Take 1 tablet (25 mg total) by mouth daily.    Marland Kitchen MAGNESIUM PO Take 400 mg by mouth daily.     . metFORMIN (GLUCOPHAGE) 1000 MG tablet Take 1,000 mg by mouth 2 (two) times daily with a meal.    . metoprolol tartrate (LOPRESSOR) 25 MG tablet Take 1 tablet (25 mg total) by mouth 2 (two) times daily.    . Multiple Vitamins-Minerals (TAB-A-VITE) TABS Take 1 tablet by mouth daily.    Marland Kitchen omeprazole (PRILOSEC) 40 MG capsule Take 40 mg by mouth daily.    . polyethylene glycol (MIRALAX / GLYCOLAX) 17 g packet Take 17 g by mouth daily as needed for mild constipation. 14 each 0  . potassium chloride (KLOR-CON) 10 MEQ tablet Take 1 tablet (10 mEq total) by mouth daily. 30 tablet 2  . tamsulosin (FLOMAX) 0.4 MG CAPS capsule Take 0.4 mg by mouth daily.    . traMADol (ULTRAM) 50 MG tablet Take 1-2 tablets (50-100 mg total) by mouth every 4 (four) hours as needed for moderate pain. 30 tablet 0  . venlafaxine XR (EFFEXOR-XR) 150  MG 24 hr capsule Take 150 mg by mouth daily with breakfast.     No current facility-administered medications for this visit.      Physical Exam:   BP 117/74   Pulse 88   Temp 98.1 F (36.7 C) (Skin)   Resp 20   Ht 5\' 9"  (1.753 m)   Wt 127 kg   SpO2 96% Comment: RA  BMI 41.35 kg/m   General:  Chronically ill-appearing, no acute distress  Chest:   Clear to auscultation bilaterally  CV:   Regular rate and rhythm  Incisions:  Clean and intact, staples are removed and the wound is Steri-Stripped  Abdomen:  Soft nontender  Extremities:  2+ pretibial edema  Diagnostic Tests:  Chest x-ray with clear lung fields   Impression:  Doing reasonably well after CABG  Plan: Follow-up with thoracic surgery in 2 weeks Increase Lasix to 40 mg twice per day  I spent in excess of 20 minutes during the conduct of this office consultation and >50% of this time involved direct face-to-face  encounter with the patient for counseling and/or coordination of their care.  Level 2                 10 minutes Level 3                 15 minutes Level 4                 25 minutes Level 5                 40 minutes  B.  Murvin Natal, MD 04/16/2020 3:24 PM

## 2020-04-29 ENCOUNTER — Other Ambulatory Visit: Payer: Self-pay

## 2020-04-29 ENCOUNTER — Emergency Department (HOSPITAL_COMMUNITY): Payer: Medicaid Other

## 2020-04-29 ENCOUNTER — Inpatient Hospital Stay (HOSPITAL_COMMUNITY)
Admission: EM | Admit: 2020-04-29 | Discharge: 2020-05-03 | DRG: 291 | Disposition: A | Discharge status: Home Health | Payer: Medicaid Other | Attending: Family Medicine | Admitting: Family Medicine

## 2020-04-29 ENCOUNTER — Encounter (HOSPITAL_COMMUNITY): Payer: Self-pay | Admitting: Emergency Medicine

## 2020-04-29 DIAGNOSIS — E662 Morbid (severe) obesity with alveolar hypoventilation: Secondary | ICD-10-CM | POA: Diagnosis present

## 2020-04-29 DIAGNOSIS — J189 Pneumonia, unspecified organism: Secondary | ICD-10-CM | POA: Diagnosis present

## 2020-04-29 DIAGNOSIS — E1142 Type 2 diabetes mellitus with diabetic polyneuropathy: Secondary | ICD-10-CM | POA: Diagnosis present

## 2020-04-29 DIAGNOSIS — Z794 Long term (current) use of insulin: Secondary | ICD-10-CM | POA: Diagnosis not present

## 2020-04-29 DIAGNOSIS — Z79899 Other long term (current) drug therapy: Secondary | ICD-10-CM | POA: Diagnosis not present

## 2020-04-29 DIAGNOSIS — Z7902 Long term (current) use of antithrombotics/antiplatelets: Secondary | ICD-10-CM | POA: Diagnosis not present

## 2020-04-29 DIAGNOSIS — J9601 Acute respiratory failure with hypoxia: Secondary | ICD-10-CM | POA: Diagnosis present

## 2020-04-29 DIAGNOSIS — Z87891 Personal history of nicotine dependence: Secondary | ICD-10-CM | POA: Diagnosis not present

## 2020-04-29 DIAGNOSIS — Z8249 Family history of ischemic heart disease and other diseases of the circulatory system: Secondary | ICD-10-CM | POA: Diagnosis not present

## 2020-04-29 DIAGNOSIS — I5021 Acute systolic (congestive) heart failure: Secondary | ICD-10-CM | POA: Diagnosis not present

## 2020-04-29 DIAGNOSIS — Z20822 Contact with and (suspected) exposure to covid-19: Secondary | ICD-10-CM | POA: Diagnosis present

## 2020-04-29 DIAGNOSIS — I2510 Atherosclerotic heart disease of native coronary artery without angina pectoris: Secondary | ICD-10-CM | POA: Diagnosis present

## 2020-04-29 DIAGNOSIS — F39 Unspecified mood [affective] disorder: Secondary | ICD-10-CM | POA: Diagnosis present

## 2020-04-29 DIAGNOSIS — E1159 Type 2 diabetes mellitus with other circulatory complications: Secondary | ICD-10-CM

## 2020-04-29 DIAGNOSIS — I10 Essential (primary) hypertension: Secondary | ICD-10-CM | POA: Diagnosis not present

## 2020-04-29 DIAGNOSIS — Z6841 Body Mass Index (BMI) 40.0 and over, adult: Secondary | ICD-10-CM | POA: Diagnosis not present

## 2020-04-29 DIAGNOSIS — I252 Old myocardial infarction: Secondary | ICD-10-CM

## 2020-04-29 DIAGNOSIS — F329 Major depressive disorder, single episode, unspecified: Secondary | ICD-10-CM | POA: Diagnosis present

## 2020-04-29 DIAGNOSIS — M546 Pain in thoracic spine: Secondary | ICD-10-CM | POA: Diagnosis present

## 2020-04-29 DIAGNOSIS — I110 Hypertensive heart disease with heart failure: Secondary | ICD-10-CM | POA: Diagnosis present

## 2020-04-29 DIAGNOSIS — Z7982 Long term (current) use of aspirin: Secondary | ICD-10-CM | POA: Diagnosis not present

## 2020-04-29 DIAGNOSIS — E119 Type 2 diabetes mellitus without complications: Secondary | ICD-10-CM

## 2020-04-29 DIAGNOSIS — I509 Heart failure, unspecified: Secondary | ICD-10-CM

## 2020-04-29 DIAGNOSIS — I25110 Atherosclerotic heart disease of native coronary artery with unstable angina pectoris: Secondary | ICD-10-CM | POA: Diagnosis not present

## 2020-04-29 DIAGNOSIS — J449 Chronic obstructive pulmonary disease, unspecified: Secondary | ICD-10-CM | POA: Diagnosis present

## 2020-04-29 DIAGNOSIS — Z888 Allergy status to other drugs, medicaments and biological substances status: Secondary | ICD-10-CM

## 2020-04-29 DIAGNOSIS — D509 Iron deficiency anemia, unspecified: Secondary | ICD-10-CM | POA: Diagnosis present

## 2020-04-29 DIAGNOSIS — J441 Chronic obstructive pulmonary disease with (acute) exacerbation: Secondary | ICD-10-CM | POA: Diagnosis present

## 2020-04-29 DIAGNOSIS — I255 Ischemic cardiomyopathy: Secondary | ICD-10-CM | POA: Diagnosis present

## 2020-04-29 DIAGNOSIS — N400 Enlarged prostate without lower urinary tract symptoms: Secondary | ICD-10-CM | POA: Diagnosis present

## 2020-04-29 DIAGNOSIS — I5043 Acute on chronic combined systolic (congestive) and diastolic (congestive) heart failure: Secondary | ICD-10-CM | POA: Diagnosis present

## 2020-04-29 DIAGNOSIS — D72829 Elevated white blood cell count, unspecified: Secondary | ICD-10-CM | POA: Diagnosis present

## 2020-04-29 DIAGNOSIS — Z951 Presence of aortocoronary bypass graft: Secondary | ICD-10-CM | POA: Diagnosis not present

## 2020-04-29 DIAGNOSIS — E785 Hyperlipidemia, unspecified: Secondary | ICD-10-CM | POA: Diagnosis present

## 2020-04-29 DIAGNOSIS — Z8616 Personal history of COVID-19: Secondary | ICD-10-CM

## 2020-04-29 DIAGNOSIS — Z880 Allergy status to penicillin: Secondary | ICD-10-CM

## 2020-04-29 DIAGNOSIS — E11649 Type 2 diabetes mellitus with hypoglycemia without coma: Secondary | ICD-10-CM

## 2020-04-29 LAB — BASIC METABOLIC PANEL
Anion gap: 15 (ref 5–15)
BUN: 21 mg/dL — ABNORMAL HIGH (ref 6–20)
CO2: 27 mmol/L (ref 22–32)
Calcium: 9 mg/dL (ref 8.9–10.3)
Chloride: 94 mmol/L — ABNORMAL LOW (ref 98–111)
Creatinine, Ser: 1 mg/dL (ref 0.61–1.24)
GFR calc Af Amer: >60 mL/min (ref >60)
GFR calc non Af Amer: >60 mL/min (ref >60)
Glucose, Bld: 84 mg/dL (ref 70–99)
Potassium: 3.9 mmol/L (ref 3.5–5.1)
Sodium: 136 mmol/L (ref 135–145)

## 2020-04-29 LAB — CBC WITH DIFFERENTIAL/PLATELET
Abs Immature Granulocytes: 0.27 10*3/uL — ABNORMAL HIGH (ref 0.00–0.07)
Basophils Absolute: 0.1 10*3/uL (ref 0.0–0.1)
Basophils Relative: 0 %
Eosinophils Absolute: 1.8 10*3/uL — ABNORMAL HIGH (ref 0.0–0.5)
Eosinophils Relative: 10 %
HCT: 28.9 % — ABNORMAL LOW (ref 39.0–52.0)
Hemoglobin: 8.9 g/dL — ABNORMAL LOW (ref 13.0–17.0)
Immature Granulocytes: 2 %
Lymphocytes Relative: 9 %
Lymphs Abs: 1.6 10*3/uL (ref 0.7–4.0)
MCH: 25.9 pg — ABNORMAL LOW (ref 26.0–34.0)
MCHC: 30.8 g/dL (ref 30.0–36.0)
MCV: 84.3 fL (ref 80.0–100.0)
Monocytes Absolute: 1.5 10*3/uL — ABNORMAL HIGH (ref 0.1–1.0)
Monocytes Relative: 9 %
Neutro Abs: 12.4 10*3/uL — ABNORMAL HIGH (ref 1.7–7.7)
Neutrophils Relative %: 70 %
Platelets: 518 10*3/uL — ABNORMAL HIGH (ref 150–400)
RBC: 3.43 MIL/uL — ABNORMAL LOW (ref 4.22–5.81)
RDW: 14.7 % (ref 11.5–15.5)
WBC: 17.6 10*3/uL — ABNORMAL HIGH (ref 4.0–10.5)
nRBC: 0 % (ref 0.0–0.2)

## 2020-04-29 LAB — BRAIN NATRIURETIC PEPTIDE: B Natriuretic Peptide: 210 pg/mL — ABNORMAL HIGH (ref 0.0–100.0)

## 2020-04-29 LAB — SARS CORONAVIRUS 2 BY RT PCR (HOSPITAL ORDER, PERFORMED IN ~~LOC~~ HOSPITAL LAB): SARS Coronavirus 2: NEGATIVE

## 2020-04-29 LAB — CBG MONITORING, ED: Glucose-Capillary: 90 mg/dL (ref 70–99)

## 2020-04-29 LAB — TROPONIN I (HIGH SENSITIVITY): Troponin I (High Sensitivity): 13 ng/L (ref <18)

## 2020-04-29 IMAGING — DX DG CHEST 1V PORT
1 series · 1 of 1 positions shown · non-contrast
Comparison: [DATE]

CLINICAL DATA: Cough, dyspnea and back pain 4 days.

EXAM:
PORTABLE CHEST 1 VIEW

[chest ap]
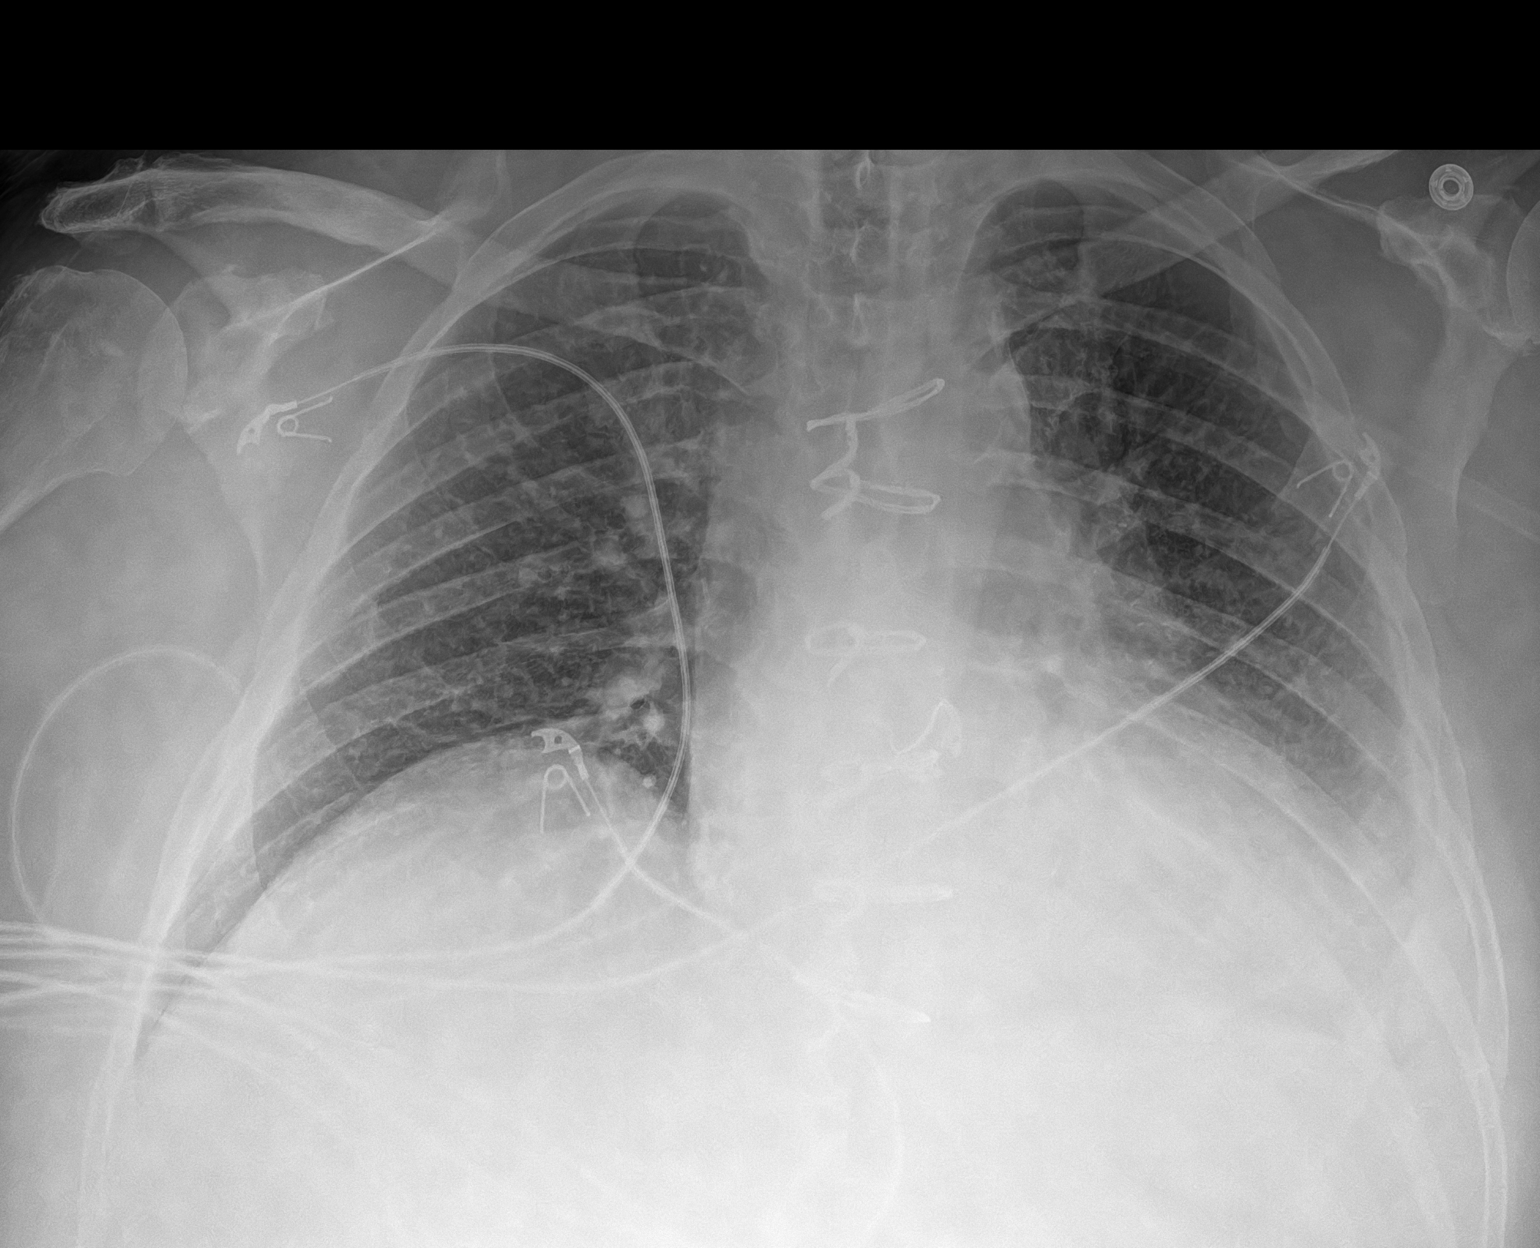

[1 of 1 positions shown; findings below may reference images not displayed]

FINDINGS: Lungs are hypoinflated with opacification over the left
base/retrocardiac region likely small effusion with associated
atelectasis although infection in the left base is possible. Stable
cardiomegaly. Remainder of the exam is unchanged.
IMPRESSION: 1. Left base opacification likely small effusion with associated
atelectasis, although infection is possible.

2.  Stable cardiomegaly.

## 2020-04-29 MED ORDER — SODIUM CHLORIDE 0.9 % IV SOLN: 250.0000 mL | INTRAVENOUS | Status: DC | PRN

## 2020-04-29 MED ORDER — POTASSIUM CHLORIDE CRYS ER 20 MEQ PO TBCR
40.0000 meq | EXTENDED_RELEASE_TABLET | Freq: Two times a day (BID) | ORAL | Status: DC
  Administered 2020-04-29 – 2020-05-03 (×8): 40 meq via ORAL
  Filled 2020-04-29 (×8): qty 2

## 2020-04-29 MED ORDER — PANTOPRAZOLE SODIUM 40 MG PO TBEC
40.0000 mg | DELAYED_RELEASE_TABLET | Freq: Every day | ORAL | Status: DC
  Administered 2020-04-30 – 2020-05-03 (×4): 40 mg via ORAL
  Filled 2020-04-29 (×4): qty 1

## 2020-04-29 MED ORDER — IPRATROPIUM-ALBUTEROL 0.5-2.5 (3) MG/3ML IN SOLN
3.0000 mL | Freq: Three times a day (TID) | RESPIRATORY_TRACT | Status: DC
  Administered 2020-04-30 – 2020-05-03 (×10): 3 mL via RESPIRATORY_TRACT
  Filled 2020-04-29 (×10): qty 3

## 2020-04-29 MED ORDER — FUROSEMIDE 10 MG/ML IJ SOLN: 60.0000 mg | Freq: Two times a day (BID) | INTRAMUSCULAR | Status: DC

## 2020-04-29 MED ORDER — ALBUTEROL SULFATE (2.5 MG/3ML) 0.083% IN NEBU
2.5000 mg | INHALATION_SOLUTION | RESPIRATORY_TRACT | Status: DC | PRN
  Administered 2020-05-01: 2.5 mg via RESPIRATORY_TRACT
  Filled 2020-04-29: qty 3

## 2020-04-29 MED ORDER — ACETAMINOPHEN 325 MG PO TABS: 650.0000 mg | ORAL_TABLET | ORAL | Status: DC | PRN

## 2020-04-29 MED ORDER — SODIUM CHLORIDE 0.9% FLUSH
3.0000 mL | Freq: Two times a day (BID) | INTRAVENOUS | Status: DC
  Administered 2020-04-30 – 2020-05-02 (×6): 3 mL via INTRAVENOUS

## 2020-04-29 MED ORDER — GABAPENTIN 300 MG PO CAPS
900.0000 mg | ORAL_CAPSULE | Freq: Two times a day (BID) | ORAL | Status: DC
  Administered 2020-04-29 – 2020-05-03 (×8): 900 mg via ORAL
  Filled 2020-04-29 (×8): qty 3

## 2020-04-29 MED ORDER — IPRATROPIUM-ALBUTEROL 0.5-2.5 (3) MG/3ML IN SOLN
3.0000 mL | Freq: Four times a day (QID) | RESPIRATORY_TRACT | Status: DC
  Administered 2020-04-29: 3 mL via RESPIRATORY_TRACT
  Filled 2020-04-29: qty 3

## 2020-04-29 MED ORDER — VENLAFAXINE HCL ER 75 MG PO CP24
150.0000 mg | ORAL_CAPSULE | Freq: Every day | ORAL | Status: DC
  Administered 2020-04-30 – 2020-05-03 (×4): 150 mg via ORAL
  Filled 2020-04-29 (×4): qty 2

## 2020-04-29 MED ORDER — INSULIN ASPART 100 UNIT/ML ~~LOC~~ SOLN
0.0000 [IU] | Freq: Three times a day (TID) | SUBCUTANEOUS | Status: DC
  Administered 2020-04-30: 3 [IU] via SUBCUTANEOUS

## 2020-04-29 MED ORDER — INSULIN ASPART PROT & ASPART (70-30 MIX) 100 UNIT/ML ~~LOC~~ SUSP
55.0000 [IU] | Freq: Two times a day (BID) | SUBCUTANEOUS | Status: DC
  Administered 2020-04-30 – 2020-05-03 (×7): 55 [IU] via SUBCUTANEOUS
  Filled 2020-04-29 (×2): qty 10

## 2020-04-29 MED ORDER — LOSARTAN POTASSIUM 50 MG PO TABS
25.0000 mg | ORAL_TABLET | Freq: Every day | ORAL | Status: DC
  Administered 2020-04-30 – 2020-05-03 (×4): 25 mg via ORAL
  Filled 2020-04-29 (×4): qty 1

## 2020-04-29 MED ORDER — INSULIN ASPART 100 UNIT/ML ~~LOC~~ SOLN
0.0000 [IU] | Freq: Every day | SUBCUTANEOUS | Status: DC
  Administered 2020-04-30 – 2020-05-01 (×2): 2 [IU] via SUBCUTANEOUS

## 2020-04-29 MED ORDER — PREDNISONE 20 MG PO TABS
40.0000 mg | ORAL_TABLET | Freq: Every day | ORAL | Status: DC
  Administered 2020-04-29 – 2020-05-02 (×4): 40 mg via ORAL
  Filled 2020-04-29 (×4): qty 2

## 2020-04-29 MED ORDER — ALBUTEROL SULFATE HFA 108 (90 BASE) MCG/ACT IN AERS
2.0000 | INHALATION_SPRAY | Freq: Once | RESPIRATORY_TRACT | Status: AC
  Administered 2020-04-29: 2 via RESPIRATORY_TRACT
  Filled 2020-04-29: qty 6.7

## 2020-04-29 MED ORDER — ASPIRIN EC 81 MG PO TBEC
81.0000 mg | DELAYED_RELEASE_TABLET | Freq: Every day | ORAL | Status: DC
  Administered 2020-04-30 – 2020-05-03 (×4): 81 mg via ORAL
  Filled 2020-04-29 (×4): qty 1

## 2020-04-29 MED ORDER — METOPROLOL TARTRATE 50 MG PO TABS
25.0000 mg | ORAL_TABLET | Freq: Two times a day (BID) | ORAL | Status: DC
  Administered 2020-04-29 – 2020-05-03 (×8): 25 mg via ORAL
  Filled 2020-04-29 (×8): qty 1

## 2020-04-29 MED ORDER — TAMSULOSIN HCL 0.4 MG PO CAPS
0.4000 mg | ORAL_CAPSULE | Freq: Every day | ORAL | Status: DC
  Administered 2020-04-30 – 2020-05-03 (×4): 0.4 mg via ORAL
  Filled 2020-04-29 (×4): qty 1

## 2020-04-29 MED ORDER — ENOXAPARIN SODIUM 60 MG/0.6ML ~~LOC~~ SOLN
60.0000 mg | SUBCUTANEOUS | Status: DC
  Administered 2020-04-29 – 2020-05-02 (×4): 60 mg via SUBCUTANEOUS
  Filled 2020-04-29 (×4): qty 0.6

## 2020-04-29 MED ORDER — TRAMADOL HCL 50 MG PO TABS
50.0000 mg | ORAL_TABLET | ORAL | Status: DC | PRN
  Administered 2020-05-01 – 2020-05-02 (×2): 50 mg via ORAL
  Filled 2020-04-29 (×2): qty 1

## 2020-04-29 MED ORDER — ATORVASTATIN CALCIUM 40 MG PO TABS
80.0000 mg | ORAL_TABLET | Freq: Every day | ORAL | Status: DC
  Administered 2020-04-30 – 2020-05-02 (×3): 80 mg via ORAL
  Filled 2020-04-29 (×3): qty 2

## 2020-04-29 MED ORDER — FUROSEMIDE 10 MG/ML IJ SOLN
40.0000 mg | Freq: Once | INTRAMUSCULAR | Status: AC
  Administered 2020-04-29: 40 mg via INTRAVENOUS
  Filled 2020-04-29: qty 4

## 2020-04-29 MED ORDER — SODIUM CHLORIDE 0.9 % IV SOLN
500.0000 mg | INTRAVENOUS | Status: AC
  Administered 2020-04-29: 500 mg via INTRAVENOUS
  Filled 2020-04-29: qty 500

## 2020-04-29 MED ORDER — SODIUM CHLORIDE 0.9% FLUSH: 3.0000 mL | INTRAVENOUS | Status: DC | PRN

## 2020-04-29 MED ORDER — ONDANSETRON HCL 4 MG/2ML IJ SOLN: 4.0000 mg | Freq: Four times a day (QID) | INTRAMUSCULAR | Status: DC | PRN

## 2020-04-29 MED ORDER — CLOPIDOGREL BISULFATE 75 MG PO TABS
75.0000 mg | ORAL_TABLET | Freq: Every day | ORAL | Status: DC
  Administered 2020-04-30 – 2020-05-03 (×4): 75 mg via ORAL
  Filled 2020-04-29 (×4): qty 1

## 2020-04-29 MED ORDER — AZITHROMYCIN 250 MG PO TABS
500.0000 mg | ORAL_TABLET | Freq: Every day | ORAL | Status: DC
  Administered 2020-04-30 – 2020-05-02 (×3): 500 mg via ORAL
  Filled 2020-04-29 (×4): qty 2

## 2020-04-29 NOTE — ED Notes (Signed)
Pt was unable to walk. While sitting on the side of the bed pt became short of breath, laying oxygen was at 94 sitting up on the side of the bed oxygen was at 89

## 2020-04-29 NOTE — Progress Notes (Signed)
Lovenox per Pharmacy for DVT Prophylaxis    Pharmacy has been consulted from dosing enoxaparin (lovenox) in this patient for DVT prophylaxis.  The pharmacist has reviewed pertinent labs (Hgb _8.9__; PLT_518__), patient weight (_127__kg) and renal function (CrCl_>90__mL/min) and decided that enoxaparin _60_mg SQ Q24Hrs is appropriate for this patient.  The pharmacy department will sign off at this time.  Please reconsult pharmacy if status changes or for further issues.  Thank you  Cyndia Diver PharmD, BCPS  04/29/2020, 9:54 PM

## 2020-04-29 NOTE — H&P (Signed)
History and Physical  Shane Chambers YTK:[id number] DOB: 17-Mar-1961 DOA: 04/29/2020  Referring physician: Kem Parkinson, PA-C, EDP PCP: [name], MD  Outpatient Specialists:   Patient Coming From: home  Chief Complaint: SOB  HPI: Shane Chambers is a 59 y.o. 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  Patient presents with 3 to 4 days of worsening shortness of breath, edema in his lower extremities.  Symptoms are worse with exertion and improved with rest.  Additionally, the patient has a wheezy, nonproductive cough.  He has been diagnosed with COPD and uses an albuterol inhaler.  His symptoms have not been improved with albuterol use.  He denies fevers, chills, nausea, vomiting.  Denies chest pain.  He does have orthopnea.  No other provoking or palliating factors.  He did not see his PCP, who gave him a prescription of doxycycline after blood work returned with an elevated white count.  Emergency Department Course: Oxygen saturation 88% on room air on arrival.  Improved with 2 L nasal cannula.  BNP elevated to 210.  Chest x-ray shows cardiomegaly, the left base atelectasis. CBC shows leukocytosis at 17, but this appears to be baseline  Review of Systems:   Pt denies any fevers, chills, nausea, vomiting, diarrhea, constipation, abdominal pain, shortness of breath, dyspnea on exertion, orthopnea, cough, wheezing, palpitations, headache, vision changes, lightheadedness, dizziness, melena, rectal bleeding.  Review of systems are otherwise negative  Past Medical History:  Diagnosis Date  . Depression   . Diabetes mellitus without complication ([place])   . Hyperlipidemia 12/01/2019  . Hypertension   . Type 2 diabetes mellitus ([place])    Past Surgical History:  Procedure Laterality Date  . CORONARY ARTERY BYPASS GRAFT N/A  03/13/2020   Procedure: CORONARY ARTERY BYPASS GRAFTING (CABG) times five using bilateral Internal mammary arteries and left radial artery;  Surgeon: [name], MD;  Location: [facility];  Service: Open Heart Surgery;  Laterality: N/A;  . DENTAL SURGERY    . RADIAL ARTERY HARVEST Left 03/13/2020   Procedure: RADIAL ARTERY HARVEST,;  Surgeon: [name], MD;  Location: [facility];  Service: Open Heart Surgery;  Laterality: Left;  . RIGHT/LEFT HEART CATH AND CORONARY ANGIOGRAPHY N/A 03/07/2020   Procedure: RIGHT/LEFT HEART CATH AND CORONARY ANGIOGRAPHY;  Surgeon: Sherren Mocha, MD;  Location: [facility];  Service: Cardiovascular;  Laterality: N/A;  . TEE WITHOUT CARDIOVERSION N/A 03/13/2020   Procedure: TRANSESOPHAGEAL ECHOCARDIOGRAM (TEE);  Surgeon: [name], MD;  Location: [facility];  Service: Open Heart Surgery;  Laterality: N/A;   Social History:  reports that he has quit smoking. He has never used smokeless tobacco. He reports current alcohol use. He reports that he does not use drugs. Patient lives at home  Allergies  Allergen Reactions  . Ace Inhibitors Swelling and Cough  . Other Itching    [name]  . Penicillins     Has patient had a PCN reaction causing immediate rash, facial/tongue/throat swelling, SOB or lightheadedness with hypotension: Unknown Has patient had a PCN reaction causing severe rash involving mucus membranes or skin necrosis: Unknown Has patient had a PCN reaction that required hospitalization: Unknown Has patient had a PCN reaction occurring within the last 10 years: Unknown If all of the above answers are "NO", then may proceed with Cephalosporin use.  Family History  Problem Relation Age of Onset  . Hypertension Mother       Prior to Admission medications   Medication Sig Start Date End Date Taking? Authorizing Provider  albuterol (VENTOLIN HFA) 108 (90 Base) MCG/ACT inhaler Inhale 1-2 puffs into the lungs every 6 (six) hours as needed  for wheezing or shortness of breath.  07/27/19  Yes [provider]  alum & mag hydroxide-simeth (MAALOX PLUS) 400-400-40 MG/5ML suspension Take 30 mLs by mouth as needed for indigestion.   Yes [provider]  aspirin 81 MG EC tablet Take 81 mg by mouth daily.  03/10/16  Yes [provider]  atorvastatin (LIPITOR) 80 MG tablet Take 1 tablet (80 mg total) by mouth at bedtime. 03/19/20  Yes Barrett, Erin R, PA-C  calcium carbonate (OS-CAL - DOSED IN MG OF ELEMENTAL CALCIUM) 1250 (500 Ca) MG tablet Take 1 tablet by mouth daily.   Yes [provider]  clopidogrel (PLAVIX) 75 MG tablet Take 1 tablet (75 mg total) by mouth daily. 03/20/20  Yes Barrett, Erin R, PA-C  docusate sodium (COLACE) 100 MG capsule Take 1 capsule (100 mg total) by mouth 2 (two) times daily. 12/25/19  Yes Shah, Pratik D, DO  doxycycline (VIBRA-TABS) 100 MG tablet Take 100 mg by mouth 2 (two) times daily.   Yes [provider]  fluticasone (VERAMYST) 27.5 MCG/SPRAY nasal spray Place 2 sprays into the nose daily as needed for rhinitis or allergies.    Yes [provider]  gabapentin (NEURONTIN) 300 MG capsule Take 900 mg by mouth 2 (two) times daily.    Yes [provider]  insulin aspart protamine- aspart (NOVOLOG MIX 70/30) (70-30) 100 UNIT/ML injection Inject 0.55 mLs (55 Units total) into the skin 2 (two) times daily with a meal. 12/25/19  Yes Manuella Ghazi, Pratik D, DO  losartan (COZAAR) 25 MG tablet Take 1 tablet (25 mg total) by mouth daily. 03/20/20  Yes Barrett, Erin R, PA-C  MAGNESIUM PO Take 400 mg by mouth daily.    Yes [provider]  metFORMIN (GLUCOPHAGE) 1000 MG tablet Take 1,000 mg by mouth 2 (two) times daily with a meal.   Yes [provider]  metoprolol tartrate (LOPRESSOR) 25 MG tablet Take 1 tablet (25 mg total) by mouth 2 (two) times daily. 03/19/20  Yes Barrett, Erin R, PA-C  Multiple Vitamins-Minerals (TAB-A-VITE) TABS Take 1 tablet by mouth  daily. 07/27/19  Yes [provider]  omeprazole (PRILOSEC) 40 MG capsule Take 40 mg by mouth daily.   Yes [provider]  polyethylene glycol (MIRALAX / GLYCOLAX) 17 g packet Take 17 g by mouth daily as needed for mild constipation. 12/25/19  Yes Shah, Pratik D, DO  potassium chloride (KLOR-CON) 10 MEQ tablet Take 1 tablet (10 mEq total) by mouth daily. 12/25/19 04/29/20 Yes Shah, Pratik D, DO  tamsulosin (FLOMAX) 0.4 MG CAPS capsule Take 0.4 mg by mouth daily.   Yes [provider]  traMADol (ULTRAM) 50 MG tablet Take 1-2 tablets (50-100 mg total) by mouth every 4 (four) hours as needed for moderate pain. 03/19/20  Yes Barrett, Erin R, PA-C  venlafaxine XR (EFFEXOR-XR) 150 MG 24 hr capsule Take 150 mg by mouth daily with breakfast.   Yes [provider]  furosemide (LASIX) 20 MG tablet Take 1 tablet (20 mg total) by mouth daily. Take an extra 20mg  by mouth if noted to have greater than 3 lb weight gain in 24 hours. 12/25/19 12/24/20  Manuella Ghazi,  Pratik D, DO    Physical Exam: BP 126/66   Pulse 94   Temp 98.7 F (37.1 C) (Oral)   Resp (!) 25   Ht 5\' 9"  (1.753 m)   Wt 127 kg   SpO2 97%   BMI 41.35 kg/m   . General: Middle-age male. Awake and alert and oriented x3. No acute cardiopulmonary distress.  Marland Kitchen HEENT: Normocephalic atraumatic.  Right and left ears normal in appearance.  Pupils equal, round, reactive to light. Extraocular muscles are intact. Sclerae anicteric and noninjected.  Moist mucosal membranes. No mucosal lesions.  . Neck: Neck supple without lymphadenopathy. No carotid bruits. No masses palpated.  . Cardiovascular: Regular rate with normal S1-S2 sounds. No murmurs, rubs, gallops auscultated.  2+ pitting edema in lower extremities bilaterally. Marland Kitchen Respiratory: Rales at bases bilaterally, left greater than right.  No accessory muscle use. . Abdomen: Soft, nontender, nondistended. Active bowel sounds. No masses or hepatosplenomegaly  . Skin: No rashes,  lesions, or ulcerations.  Dry, warm to touch. 2+ dorsalis pedis and radial pulses. . Musculoskeletal: No calf or leg pain. All major joints not erythematous nontender.  No upper or lower joint deformation.  Good ROM.  No contractures  . Psychiatric: Intact judgment and insight. Pleasant and cooperative. . Neurologic: No focal neurological deficits. Strength is 5/5 and symmetric in upper and lower extremities.  Cranial nerves II through XII are grossly intact.           Labs on Admission: I have personally reviewed following labs and imaging studies  CBC: Recent Labs  Lab 04/29/20 1330  WBC 17.6*  NEUTROABS 12.4*  HGB 8.9*  HCT 28.9*  MCV 84.3  PLT 458*   Basic Metabolic Panel: Recent Labs  Lab 04/29/20 1330  NA 136  K 3.9  CL 94*  CO2 27  GLUCOSE 84  BUN 21*  CREATININE 1.00  CALCIUM 9.0   GFR: Estimated Creatinine Clearance: 107.4 mL/min (by C-G formula based on SCr of 1 mg/dL). Liver Function Tests: No results for input(s): AST, ALT, ALKPHOS, BILITOT, PROT, ALBUMIN in the last 168 hours. No results for input(s): LIPASE, AMYLASE in the last 168 hours. No results for input(s): AMMONIA in the last 168 hours. Coagulation Profile: No results for input(s): INR, PROTIME in the last 168 hours. Cardiac Enzymes: No results for input(s): CKTOTAL, CKMB, CKMBINDEX, TROPONINI in the last 168 hours. BNP (last 3 results) No results for input(s): PROBNP in the last 8760 hours. HbA1C: No results for input(s): HGBA1C in the last 72 hours. CBG: No results for input(s): GLUCAP in the last 168 hours. Lipid Profile: No results for input(s): CHOL, HDL, LDLCALC, TRIG, CHOLHDL, LDLDIRECT in the last 72 hours. Thyroid Function Tests: No results for input(s): TSH, T4TOTAL, FREET4, T3FREE, THYROIDAB in the last 72 hours. Anemia Panel: No results for input(s): VITAMINB12, FOLATE, FERRITIN, TIBC, IRON, RETICCTPCT in the last 72 hours. Urine analysis:    Component Value Date/Time    COLORURINE YELLOW 03/05/2020 1251   APPEARANCEUR CLEAR 03/05/2020 1251   LABSPEC 1.014 03/05/2020 1251   PHURINE 7.0 03/05/2020 1251   GLUCOSEU >=500 (A) 03/05/2020 1251   HGBUR NEGATIVE 03/05/2020 1251   BILIRUBINUR NEGATIVE 03/05/2020 1251   KETONESUR NEGATIVE 03/05/2020 1251   PROTEINUR 100 (A) 03/05/2020 1251   NITRITE NEGATIVE 03/05/2020 1251   LEUKOCYTESUR NEGATIVE 03/05/2020 1251   Sepsis Labs: @LABRCNTIP (procalcitonin:4,lacticidven:4) )No results found for this or any previous visit (from the past 240 hour(s)).   Radiological Exams on Admission: DG Chest  Port 1 View  Result Date: 04/29/2020 CLINICAL DATA:  Cough, dyspnea and back pain 4 days. EXAM: PORTABLE CHEST 1 VIEW COMPARISON:  04/16/2020 FINDINGS: Lungs are hypoinflated with opacification over the left base/retrocardiac region likely small effusion with associated atelectasis although infection in the left base is possible. Stable cardiomegaly. Remainder of the exam is unchanged. IMPRESSION: 1. Left base opacification likely small effusion with associated atelectasis, although infection is possible. 2.  Stable cardiomegaly. Electronically Signed   By: Marin Olp M.D.   On: 04/29/2020 15:26    EKG: Independently reviewed.  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  No acute ST changes  Assessment/Plan: Principal Problem:   Acute respiratory failure with hypoxia (H[facility]) Active Problems:   Hypertension   Type 2 diabetes mellitus ([facility])   Acute on chronic combined systolic and diastolic CHF (congestive heart failure) ([facility])   Ischemic cardiomyopathy   Coronary artery disease   S/P CABG x 5   Acute exacerbation of chronic obstructive pulmonary disease (COPD) ([place])   Leukocytosis    This patient was discussed with the ED physician, including pertinent vitals, physical exam findings, labs, and imaging.  We also discussed care given by the ED provider.  1. Acute respiratory failure with hypoxia a. Oxygen support 2. Acute  exacerbation of COPD Antibiotics: Continue doxy DuoNeb's every 6 scheduled with albuterol every 2 when necessary Continue inhaled steroids and LA bronchodilator Prednisone 40mg  daily Mucinex 3. Acute on chronic systolic heart failure Telemetry monitoring Strict I/O Daily Weights Diuresis: lasix 60mg  IV bid Potassium: 40 mEq twice a day by mouth Echo cardiac exam tomorrow Repeat BMP tomorrow 4. Leukocytosis a. Appears at baseline. May need hematology workup following admission 5. Ischemic cardiomyopathy, coronary artery disease status post CABG 5 vessels, hypertension,  a. Continue home regimen 6. Type 2 diabetes a. Home continue insulin b. SSI, CBGs c. Hold metformin  DVT prophylaxis: lovenox Consultants: none Code Status: full Family Communication: none  Disposition Plan: should be able to return home following admission   Truett Mainland, DO

## 2020-04-29 NOTE — ED Provider Notes (Signed)
[facility] EMERGENCY DEPARTMENT Provider Note   CSN: [id number] Arrival date & time: 04/29/20  1149     History Chief Complaint  Patient presents with  . Back Pain  . Cough    Shane Chambers is a 59 y.o. male.  HPI      Shane Chambers is a 59 y.o. male with past medical history significant for type II diabetes, hypertension, hyperlipidemia, CHF and coronary artery disease with CABG 1 month ago, he presents to the Emergency Department complaining of cough and upper back pain.  He states the cough is nonproductive and back pain is associated with the cough.  His symptoms have been present for 4 days.  He denies hemoptysis or colored sputum.  No reported fever or chills.  Overall, he reports he has been doing well since his recent CABG.  He takes Lasix daily and has recently been increased to 40 mg Lasix twice daily.  He reports having increased swelling of his lower legs and dyspnea on exertion.  He is not oxygen dependent.  Past Medical History:  Diagnosis Date  . Depression   . Diabetes mellitus without complication ([place])   . Hyperlipidemia 12/01/2019  . Hypertension   . Type 2 diabetes mellitus [facility])     Patient Active Problem List   Diagnosis Date Noted  . 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 Ischemic cardiomyopathy   . Coronary artery disease   . Acute on chronic combined systolic and diastolic CHF (congestive heart failure) ([place]) 03/05/2020  . Acute hyponatremia 03/05/2020  . Acute respiratory failure with hypoxia ([place]) 12/21/2019  . Acute respiratory failure ([place]) 12/20/2019  . Severe Vitamin D deficiency 12/02/2019  . CAP (community acquired pneumonia) 12/01/2019  . Hypokalemia 12/01/2019  . Hyperlipidemia 12/01/2019  . BPH (benign prostatic hyperplasia) 12/01/2019  . Normocytic anemia 12/01/2019  . Pneumonia due to COVID-19 virus 12/01/2019  . Hypoxia   . SOB (shortness of breath) 11/16/2019  . Sepsis ([place]) 11/16/2019  . Hypertension   . Type 2 diabetes mellitus  ([place])   . Depression   . Acute bronchitis with bronchospasm 11/15/2019    Past Surgical History:  Procedure Laterality Date  . CORONARY ARTERY BYPASS GRAFT N/A 03/13/2020   Procedure: CORONARY ARTERY BYPASS GRAFTING (CABG) times five using bilateral Internal mammary arteries and left radial artery;  Surgeon: [name], MD;  Location: [place];  Service: Open Heart Surgery;  Laterality: N/A;  . DENTAL SURGERY    . RADIAL ARTERY HARVEST Left 03/13/2020   Procedure: RADIAL ARTERY HARVEST,;  Surgeon: [name], MD;  Location: [place];  Service: Open Heart Surgery;  Laterality: Left;  . RIGHT/LEFT HEART CATH AND CORONARY ANGIOGRAPHY N/A 03/07/2020   Procedure: RIGHT/LEFT HEART CATH AND CORONARY ANGIOGRAPHY;  Surgeon: Sherren Mocha, MD;  Location: [place] CV LAB;  Service: Cardiovascular;  Laterality: N/A;  . TEE WITHOUT CARDIOVERSION N/A 03/13/2020   Procedure: TRANSESOPHAGEAL ECHOCARDIOGRAM (TEE);  Surgeon: [name], MD;  Location: [place];  Service: Open Heart Surgery;  Laterality: N/A;       Family History  Problem Relation Age of Onset  . Hypertension Mother     Social History   Tobacco Use  . Smoking status: Former Research scientist (life sciences)  . Smokeless tobacco: Never Used  Vaping Use  . Vaping Use: Never used  Substance Use Topics  . Alcohol use: Yes    Comment: rarely  . Drug use: No    Home Medications Prior to Admission medications  Medication Sig Start Date End Date Taking? Authorizing Provider  albuterol (VENTOLIN HFA) 108 (90 Base) MCG/ACT inhaler Inhale 1-2 puffs into the lungs every 6 (six) hours as needed for wheezing or shortness of breath.  07/27/19  Yes [provider]  alum & mag hydroxide-simeth (MAALOX PLUS) 400-400-40 MG/5ML suspension Take 30 mLs by mouth as needed for indigestion.   Yes [provider]  aspirin 81 MG EC tablet Take 81 mg by mouth daily.  03/10/16  Yes [provider]  atorvastatin (LIPITOR) 80 MG tablet Take 1  tablet (80 mg total) by mouth at bedtime. 03/19/20  Yes Barrett, Erin R, PA-C  calcium carbonate (OS-CAL - DOSED IN MG OF ELEMENTAL CALCIUM) 1250 (500 Ca) MG tablet Take 1 tablet by mouth daily.   Yes [provider]  clopidogrel (PLAVIX) 75 MG tablet Take 1 tablet (75 mg total) by mouth daily. 03/20/20  Yes Barrett, Erin R, PA-C  docusate sodium (COLACE) 100 MG capsule Take 1 capsule (100 mg total) by mouth 2 (two) times daily. 12/25/19  Yes Shah, Pratik D, DO  doxycycline (VIBRA-TABS) 100 MG tablet Take 100 mg by mouth 2 (two) times daily.   Yes [provider]  fluticasone (VERAMYST) 27.5 MCG/SPRAY nasal spray Place 2 sprays into the nose daily as needed for rhinitis or allergies.    Yes [provider]  gabapentin (NEURONTIN) 300 MG capsule Take 900 mg by mouth 2 (two) times daily.    Yes [provider]  insulin aspart protamine- aspart (NOVOLOG MIX 70/30) (70-30) 100 UNIT/ML injection Inject 0.55 mLs (55 Units total) into the skin 2 (two) times daily with a meal. 12/25/19  Yes Manuella Ghazi, Pratik D, DO  losartan (COZAAR) 25 MG tablet Take 1 tablet (25 mg total) by mouth daily. 03/20/20  Yes Barrett, Erin R, PA-C  MAGNESIUM PO Take 400 mg by mouth daily.    Yes [provider]  metFORMIN (GLUCOPHAGE) 1000 MG tablet Take 1,000 mg by mouth 2 (two) times daily with a meal.   Yes [provider]  metoprolol tartrate (LOPRESSOR) 25 MG tablet Take 1 tablet (25 mg total) by mouth 2 (two) times daily. 03/19/20  Yes Barrett, Erin R, PA-C  Multiple Vitamins-Minerals (TAB-A-VITE) TABS Take 1 tablet by mouth daily. 07/27/19  Yes [provider]  omeprazole (PRILOSEC) 40 MG capsule Take 40 mg by mouth daily.   Yes [provider]  polyethylene glycol (MIRALAX / GLYCOLAX) 17 g packet Take 17 g by mouth daily as needed for mild constipation. 12/25/19  Yes Shah, Pratik D, DO  potassium chloride (KLOR-CON) 10 MEQ tablet Take 1 tablet (10 mEq total) by mouth  daily. 12/25/19 04/29/20 Yes Shah, Pratik D, DO  tamsulosin (FLOMAX) 0.4 MG CAPS capsule Take 0.4 mg by mouth daily.   Yes [provider]  traMADol (ULTRAM) 50 MG tablet Take 1-2 tablets (50-100 mg total) by mouth every 4 (four) hours as needed for moderate pain. 03/19/20  Yes Barrett, Erin R, PA-C  venlafaxine XR (EFFEXOR-XR) 150 MG 24 hr capsule Take 150 mg by mouth daily with breakfast.   Yes [provider]  furosemide (LASIX) 20 MG tablet Take 1 tablet (20 mg total) by mouth daily. Take an extra 20mg  by mouth if noted to have greater than 3 lb weight gain in 24 hours. 12/25/19 12/24/20  Manuella Ghazi, Pratik D, DO    Allergies    Ace inhibitors, Other, and Penicillins  Review of Systems   Review of Systems  Constitutional: Negative for appetite change, chills and fever.  HENT: Negative for congestion and trouble swallowing.   Respiratory: Positive for cough and shortness of breath. Negative for chest tightness and wheezing.   Cardiovascular: Positive for leg swelling. Negative for chest pain.  Gastrointestinal: Negative for abdominal pain, diarrhea, nausea and vomiting.  Genitourinary: Negative for difficulty urinating and dysuria.  Musculoskeletal: Positive for back pain. Negative for arthralgias.  Skin: Negative for rash.  Neurological: Negative for dizziness, syncope, weakness, numbness and headaches.  Hematological: Negative for adenopathy.    Physical Exam Updated Vital Signs BP 126/66   Pulse 94   Temp 98.7 F (37.1 C) (Oral)   Resp (!) 25   Ht 5\' 9"  (1.753 m)   Wt 127 kg   SpO2 97%   BMI 41.35 kg/m   Physical Exam Vitals and nursing note reviewed.  Constitutional:      Appearance: He is not ill-appearing.  HENT:     Mouth/Throat:     Mouth: Mucous membranes are moist.  Eyes:     Conjunctiva/sclera: Conjunctivae normal.  Cardiovascular:     Rate and Rhythm: Normal rate and regular rhythm.     Pulses: Normal pulses.  Pulmonary:     Breath sounds:  Wheezing present.     Comments: Few scattered expiratory wheezes.  Patient on 2 L O2 by nasal cannula on my exam, able to speak in complete sentences, but breathing is labored. Abdominal:     Palpations: Abdomen is soft.     Tenderness: There is no abdominal tenderness.  Musculoskeletal:        General: Normal range of motion.     Cervical back: Normal range of motion.     Right lower leg: Edema present.     Left lower leg: Edema present.  Skin:    General: Skin is warm.     Capillary Refill: Capillary refill takes less than 2 seconds.     Comments: Multiple scratches and abrasions to the bilateral lower extremities.  No significant erythema, excessive warmth or induration.  No lymphangitis.  Neurological:     General: No focal deficit present.     Mental Status: He is alert.     Sensory: Sensation is intact. No sensory deficit.     Motor: No weakness.     Comments: CN II-XII grossly intact.   Psychiatric:        Mood and Affect: Mood normal.     ED Results / Procedures / Treatments   Labs (all labs ordered are listed, but only abnormal results are displayed) Labs Reviewed  BASIC METABOLIC PANEL - Abnormal; Notable for the following components:      Result Value   Chloride 94 (*)    BUN 21 (*)    All other components within normal limits  BRAIN NATRIURETIC PEPTIDE - Abnormal; Notable for the following components:   B Natriuretic Peptide 210.0 (*)    All other components within normal limits  CBC WITH DIFFERENTIAL/PLATELET - Abnormal; Notable for the following components:   WBC 17.6 (*)    RBC 3.43 (*)    Hemoglobin 8.9 (*)    HCT 28.9 (*)    MCH 25.9 (*)    Platelets 518 (*)    Neutro Abs 12.4 (*)    Monocytes Absolute 1.5 (*)    Eosinophils Absolute 1.8 (*)    Abs Immature Granulocytes 0.27 (*)    All other components within normal limits    EKG EKG Interpretation  Date/Time:  Sunday April 29 2020 15:26:40 EDT Ventricular Rate:  88 PR Interval:    QRS  Duration: 104 QT Interval:  396 QTC Calculation: 480 R Axis:   5 Text Interpretation: Sinus rhythm Inferior infarct, age indeterminate Confirmed by Milton Ferguson ([id number]) on 04/29/2020 5:58:59 PM   Radiology DG Chest Port 1 View  Result Date: 04/29/2020 CLINICAL DATA:  Cough, dyspnea and back pain 4 days. EXAM: PORTABLE CHEST 1 VIEW COMPARISON:  04/16/2020 FINDINGS: Lungs are hypoinflated with opacification over the left base/retrocardiac region likely small effusion with associated atelectasis although infection in the left base is possible. Stable cardiomegaly. Remainder of the exam is unchanged. IMPRESSION: 1. Left base opacification likely small effusion with associated atelectasis, although infection is possible. 2.  Stable cardiomegaly. Electronically Signed   By: Marin Olp M.D.   On: 04/29/2020 15:26    Procedures Procedures (including critical care time)  Medications Ordered in ED Medications  albuterol (VENTOLIN HFA) 108 (90 Base) MCG/ACT inhaler 2 puff (2 puffs Inhalation Given 04/29/20 1524)  furosemide (LASIX) injection 40 mg (40 mg Intravenous Given 04/29/20 1648)    ED Course  I have reviewed the triage vital signs and the nursing notes.  Pertinent labs & imaging results that were available during my care of the patient were reviewed by me and considered in my medical decision making (see chart for details).    MDM Rules/Calculators/A&P                         Patient here with back pain and cough with dyspnea on exertion.  History of recent CABG 1 month ago also history of CHF.  Patient O2 sat mid 90s on 2 L of O2 by nasal cannula.  He is not oxygen dependent. On my exam, patient's breathing is labored, edema of the bilateral lower extremities.  His current symptoms are likely related to CHF exacerbation.  Will obtain labs, EKG, and chest x-ray.  Patient did take his morning dose of Lasix.  Chest x-ray equivocal, BNP mildly elevated at 210.  Denies chest pain.  He  does have a leukocytosis that appears to be baseline.  When asked, patient states that his primary provider started him on doxycycline proximately 2 days ago for a "infection" but patient unclear on source.  IV Lasix ordered, will discontinue the oxygen and attempt to ambulate patient.  Oxygen discontinued and patient dropped from 96% to 93%.  Patient sat up on the edge of the stretcher and oxygen dropped to 88 to 89% while on room air.  Started back on 2 L.  Feel that patient has likely CHF exacerbation will need admission for further diuresis.  Consulted hospitalist, Dr. Nehemiah Settle and discussed findings.  He agrees to admit  Final Clinical Impression(s) / ED Diagnoses Final diagnoses:  Acute congestive heart failure, unspecified heart failure type [facility])    Rx / DC Orders ED Discharge Orders    None       Bufford Lope 04/29/20 Milus Mallick, MD 04/30/20 1039

## 2020-04-29 NOTE — ED Notes (Signed)
Pt's room air o2 sat 89%.  Placed pt on o2 at 2Liters via [place], sat increased to 97%.  Notified PA.

## 2020-04-29 NOTE — ED Triage Notes (Signed)
Pt c/o of cough and back pain x 4 days

## 2020-04-30 ENCOUNTER — Inpatient Hospital Stay (HOSPITAL_COMMUNITY): Payer: Medicaid Other

## 2020-04-30 ENCOUNTER — Ambulatory Visit: Payer: Medicaid Other | Admitting: Cardiothoracic Surgery

## 2020-04-30 DIAGNOSIS — I5021 Acute systolic (congestive) heart failure: Secondary | ICD-10-CM

## 2020-04-30 LAB — ECHOCARDIOGRAM COMPLETE
Height: 69 in
Weight: 4546.77 oz

## 2020-04-30 LAB — TROPONIN I (HIGH SENSITIVITY): Troponin I (High Sensitivity): 12 ng/L (ref <18)

## 2020-04-30 LAB — BASIC METABOLIC PANEL
Anion gap: 13 (ref 5–15)
BUN: 22 mg/dL — ABNORMAL HIGH (ref 6–20)
CO2: 27 mmol/L (ref 22–32)
Calcium: 8.9 mg/dL (ref 8.9–10.3)
Chloride: 96 mmol/L — ABNORMAL LOW (ref 98–111)
Creatinine, Ser: 0.82 mg/dL (ref 0.61–1.24)
GFR calc Af Amer: >60 mL/min (ref >60)
GFR calc non Af Amer: >60 mL/min (ref >60)
Glucose, Bld: 225 mg/dL — ABNORMAL HIGH (ref 70–99)
Potassium: 4.6 mmol/L (ref 3.5–5.1)
Sodium: 136 mmol/L (ref 135–145)

## 2020-04-30 LAB — GLUCOSE, CAPILLARY
Glucose-Capillary: 187 mg/dL — ABNORMAL HIGH (ref 70–99)
Glucose-Capillary: 188 mg/dL — ABNORMAL HIGH (ref 70–99)
Glucose-Capillary: 229 mg/dL — ABNORMAL HIGH (ref 70–99)
Glucose-Capillary: 247 mg/dL — ABNORMAL HIGH (ref 70–99)

## 2020-04-30 MED ORDER — INSULIN ASPART 100 UNIT/ML ~~LOC~~ SOLN
0.0000 [IU] | Freq: Three times a day (TID) | SUBCUTANEOUS | Status: DC
  Administered 2020-04-30: 7 [IU] via SUBCUTANEOUS
  Administered 2020-04-30: 4 [IU] via SUBCUTANEOUS
  Administered 2020-05-01: 7 [IU] via SUBCUTANEOUS
  Administered 2020-05-01 – 2020-05-02 (×2): 4 [IU] via SUBCUTANEOUS
  Administered 2020-05-02: 7 [IU] via SUBCUTANEOUS
  Administered 2020-05-03: 4 [IU] via SUBCUTANEOUS

## 2020-04-30 MED ORDER — FUROSEMIDE 10 MG/ML IJ SOLN
80.0000 mg | Freq: Two times a day (BID) | INTRAMUSCULAR | Status: DC
  Administered 2020-04-30 – 2020-05-03 (×6): 80 mg via INTRAVENOUS
  Filled 2020-04-30 (×6): qty 8

## 2020-04-30 NOTE — Progress Notes (Signed)
*  PRELIMINARY RESULTS* Echocardiogram 2D Echocardiogram has been performed.  Shane Chambers 04/30/2020, 1:46 PM

## 2020-04-30 NOTE — Plan of Care (Signed)
  Problem: Acute Rehab PT Goals(only PT should resolve) Goal: Pt Will Go Supine/Side To Sit Outcome: Progressing Flowsheets (Taken 04/30/2020 1120) Pt will go Supine/Side to Sit: with modified independence Goal: Patient Will Transfer Sit To/From Stand Outcome: Progressing Flowsheets (Taken 04/30/2020 1120) Patient will transfer sit to/from stand: with supervision Goal: Pt Will Transfer Bed To Chair/Chair To Bed Outcome: Progressing Flowsheets (Taken 04/30/2020 1120) Pt will Transfer Bed to Chair/Chair to Bed: with supervision Goal: Pt Will Ambulate Outcome: Progressing Flowsheets (Taken 04/30/2020 1120) Pt will Ambulate:  50 feet  with supervision  with rolling walker   11:21 AM, 04/30/20 Lonell Grandchild, MPT Physical Therapist with [facility] [phone] office [phone] mobile phone

## 2020-04-30 NOTE — Progress Notes (Signed)
Initial Nutrition Assessment  DOCUMENTATION CODES:      INTERVENTION:  Nutrition education -provided   NUTRITION DIAGNOSIS:   Food and nutrition related knowledge deficit related to acute on chronic HF as evidenced by per patient/family report (diet recall).   GOAL:  Patient will meet greater than or equal to 90% of their needs   MONITOR:  PO intake, I & O's, Labs, Weight trends   REASON FOR ASSESSMENT:  Consult Assessment of nutrition requirement/status  ASSESSMENT: Patient is a 59 yo male with hx of DM-2, HTN , COPD, CAD, CHF (daily diuretic), edema lower extremities. He presents short of breath.  Chest x-ray-findings:cardiomegaly, left base atelectasis.   Patient has a good appetite-po's:75% of meals. At home he has been eating frozen meals regularly. We talked about options for best choices. He reports usual intake 2 meals and a snack. Hot or cold cereal is frequent breakfast. Low Sodium diet basics reviewed.   Weight history shows fluctuations between 125-131 kg this year. An outlier wt of 137.2 kg in January. Nursing has provided Heart Failure packet and discussed the importance of daily weights.   Medications reviewed and include: Lasix, insulin, plavix, KCL, protonix  Labs reviewed: Glucose 225,  CBG-187   Intake/Output Summary (Last 24 hours) at 04/30/2020 1130 Last data filed at 04/30/2020 0900 Gross per 24 hour  Intake 480 ml  Output 700 ml  Net -220 ml     NUTRITION - FOCUSED PHYSICAL EXAM:    Most Recent Value  Orbital Region No depletion  Upper Arm Region No depletion  Thoracic and Lumbar Region No depletion  Buccal Region No depletion  Temple Region No depletion  Clavicle Bone Region No depletion  Clavicle and Acromion Bone Region No depletion  Scapular Bone Region Unable to assess  Dorsal Hand No depletion  Patellar Region No depletion  Anterior Thigh Region No depletion  Posterior Calf Region No depletion  Edema (RD Assessment) Mild  Hair --   [balding]  Eyes Reviewed  Mouth Reviewed  Skin Reviewed  Nails Reviewed     Diet Order:   Diet Order            Diet heart healthy/carb modified Room service appropriate? Yes; Fluid consistency: Thin  Diet effective now                EDUCATION NEEDS:  Education needs have been addressed   Skin:  Skin Assessment: Reviewed RN Assessment  Last BM:  6/20  Height:   Ht Readings from Last 1 Encounters:  04/29/20 5\' 9"  (1.753 m)    Weight:   Wt Readings from Last 1 Encounters:  04/30/20 128.9 kg    Ideal Body Weight:   73 kg   BMI:  Body mass index is 41.97 kg/m.  Estimated Nutritional Needs:   Kcal:  2107-2318  Protein:  131-140 gr  Fluid:  < 2liters daily   Colman Cater MS,RD,CSG,LDN Pager: available through [facility]

## 2020-04-30 NOTE — Progress Notes (Signed)
Educated pt on Heart Failure, instructed pt on how to properly weigh and detect gains. Provided pt with Heart Failure packet. Pt verbally expressed understanding.

## 2020-04-30 NOTE — Progress Notes (Addendum)
PROGRESS NOTE    Shane Chambers  WJX:[id number] DOB: November 10, 1961 DOA: 04/29/2020 PCP: [name], MD      Brief Narrative:  [name] is a 59 y.o. M with obesity BMI 42, DM, HTN, COVID-19 in Jan 2021, CAD s/p CABG in May 2021 by Dr. Orvan Seen, and possible COPD who presented with several days progressive cough, SOB.  In the ER, hypoxic to 88%, BNP slightly elevated, CXR showed left base opacity, WBC 17K.  Started on steroids, bronchodilators and Lasix and admitted for COPD flare and CHF flare.         Assessment & Plan:  Acute respiratory failure with hypoxia due to acute on chronic diastolic CHF Patient without significant CHF in past, but had MI with CABG last month.  Now with elevatd BNP, orthopnea, possible edema on CXR.  Echocardiogram today shows normal EF, grade II DD.    Improving with Lasix.  Cr stable, net negative 700cc.  Troponins do not suggest ACS.  -Continue Lasix, increase to 80 mg IV twice a day  -K supplement -Strict I/Os, daily weights, telemetry  -Daily monitoring renal function     COPD exacerbation, likely Obesity hypoventilation syndrome, likely Patient remote smoker.  No real chronic bronchitis or dyspnea symptoms until 2018 when he moved in with his mother, after which noticed more frequent bronchitis episodes, maybe? chronic cough.    Major change in his respiratory status was in Jan 2021, had COVID, admitted for 5 days on steroids, remdesivir, peak O2 requirement 2L.  Since then, he has had chronic dyspnea on exertion, feeling out of breath, fatigued.    PFTs reviewed (done during May admission for CABG), normal FEV1/FVC ratio, although both reduced.  ?OHS + COPD, needs Pulm referral after discharge.  Here, he was somewhat wheezy for prior providers, seems to feel better on steroids and bronchodilators -Continue bronchodilators -Continue azithromycin -Continue prednisone, day 2 of 5  -I have sent a message to Pulmonology, who have graciously  offered to arrange pulm referral after discharge, info included in "Follow up Providers" section of AVS ADDENDUM: scheduled with Dr. Elsworth Soho on 05/24/2020 at 11:50am     Pneumonia, likely His CXR shows Left base opacity, WBC is up and primary symptomatology on presentation was cough, so I think coverage of CAP is appropriate.  He is improving with azithromycin, and so will stick with this, don't think broadening to typical CAP coverage is completely necessary. -Continue azithromycin day 2 of 5  Obesity BMI 42 -Continue pantoprazole  Hypertension Coronary disease secondary prevention, CABG in May -Continue aspirin, atorvastatin, Plavix -Continue losartan, metoprolol  Diabetes with polyneuropathy Glucoses elevated -Continue SS insulin, increase dose -Continue 70/30 at home dose -Continue gabapentin -Hold home metformin  Anemia Near microcytic.  Hgb lower than 10 g/dL range post-CABG.  No clinical bleeding. -Check ferritin, iron panel, retics  Mood disorder -Continue venlafaxine  BPH -Continue Flomax         Disposition: Status is: Inpatient  Remains inpatient appropriate because:ongoing hypoxia requiring new supplemental O2 from CHF, PNA and COPD   Dispo: The patient is from: Home              Anticipated d/c is to: TBD              Anticipated d/c date is: 2 days              Patient currently is not medically stable to d/c.     Patient still on o2.  Will  augment diureiss, continue Abx, nebs and steroids.  Hopefully able to wean O2 tomorrow or Weds and home.         MDM: The below labs and imaging reports were reviewed and summarized above.  Medication management as above.     DVT prophylaxis: Lovenox  Code Status: FULL Family Communication:     Consultants:     Procedures:   6/21 Echo -- EF normal, Grade II DD  Antimicrobials:    Azithromycin 6/20 >>  Culture data:              Subjective: Patient still feeling quite out  of breath.  No fever overnight.  No vomiting, blurry vision, confusion.  Objective: Vitals:   04/30/20 0428 04/30/20 0739 04/30/20 0830 04/30/20 1441  BP: (!) 150/87  (!) 147/77   Pulse: 96  87   Resp: 20  18   Temp: 98 F (36.7 C)  98.6 F (37 C)   TempSrc: Oral  Oral   SpO2: 95% 96% 100% 99%  Weight:      Height:        Intake/Output Summary (Last 24 hours) at 04/30/2020 1445 Last data filed at 04/30/2020 1300 Gross per 24 hour  Intake 840 ml  Output 1600 ml  Net -760 ml   Filed Weights   04/29/20 1215 04/29/20 2153 04/30/20 0034  Weight: 127 kg 128.9 kg 128.9 kg    Examination: General appearance: Obese adult male, alert and in no acute distress.  Sitting on the edge of the bed. HEENT: Anicteric, conjunctiva pink, lids and lashes normal. No nasal deformity, discharge, epistaxis.  Lips moist.  Nasal cannula in place. Skin: Warm and dry.  No jaundice.  No suspicious rashes or lesions. Cardiac: RRR, nl S1-S2, no murmurs appreciated.  Capillary refill is brisk.  JVP not visible.  No LE edema.  Radial pulses 2+ and symmetric. Respiratory: Normal respiratory rate and rhythm.  CTAB without rales or wheezes. Abdomen: Abdomen soft.  No TTP or guarding. No ascites, distension, hepatosplenomegaly.   MSK: No deformities or effusions. Neuro: Awake and alert.  EOMI, moves all extremities. Speech fluent.    Psych: Sensorium intact and responding to questions, attention normal. Affect normal.  Judgment and insight appear normal.    Data Reviewed: I have personally reviewed following labs and imaging studies:  CBC: Recent Labs  Lab 04/29/20 1330  WBC 17.6*  NEUTROABS 12.4*  HGB 8.9*  HCT 28.9*  MCV 84.3  PLT 916*   Basic Metabolic Panel: Recent Labs  Lab 04/29/20 1330 04/30/20 0615  NA 136 136  K 3.9 4.6  CL 94* 96*  CO2 27 27  GLUCOSE 84 225*  BUN 21* 22*  CREATININE 1.00 0.82  CALCIUM 9.0 8.9   GFR: Estimated Creatinine Clearance: 132.1 mL/min (by C-G formula  based on SCr of 0.82 mg/dL). Liver Function Tests: No results for input(s): AST, ALT, ALKPHOS, BILITOT, PROT, ALBUMIN in the last 168 hours. No results for input(s): LIPASE, AMYLASE in the last 168 hours. No results for input(s): AMMONIA in the last 168 hours. Coagulation Profile: No results for input(s): INR, PROTIME in the last 168 hours. Cardiac Enzymes: No results for input(s): CKTOTAL, CKMB, CKMBINDEX, TROPONINI in the last 168 hours. BNP (last 3 results) No results for input(s): PROBNP in the last 8760 hours. HbA1C: No results for input(s): HGBA1C in the last 72 hours. CBG: Recent Labs  Lab 04/29/20 2250 04/30/20 0834 04/30/20 1140  GLUCAP 90 187* 188*   Lipid  Profile: No results for input(s): CHOL, HDL, LDLCALC, TRIG, CHOLHDL, LDLDIRECT in the last 72 hours. Thyroid Function Tests: No results for input(s): TSH, T4TOTAL, FREET4, T3FREE, THYROIDAB in the last 72 hours. Anemia Panel: No results for input(s): VITAMINB12, FOLATE, FERRITIN, TIBC, IRON, RETICCTPCT in the last 72 hours. Urine analysis:    Component Value Date/Time   COLORURINE YELLOW 03/05/2020 1251   APPEARANCEUR CLEAR 03/05/2020 1251   LABSPEC 1.014 03/05/2020 1251   PHURINE 7.0 03/05/2020 1251   GLUCOSEU >=500 (A) 03/05/2020 1251   HGBUR NEGATIVE 03/05/2020 1251   BILIRUBINUR NEGATIVE 03/05/2020 1251   KETONESUR NEGATIVE 03/05/2020 1251   PROTEINUR 100 (A) 03/05/2020 1251   NITRITE NEGATIVE 03/05/2020 1251   LEUKOCYTESUR NEGATIVE 03/05/2020 1251   Sepsis Labs: @LABRCNTIP (procalcitonin:4,lacticacidven:4)  ) Recent Results (from the past 240 hour(s))  SARS Coronavirus 2 by RT PCR (hospital order, performed in [place] hospital lab) Nasopharyngeal Nasopharyngeal Swab     Status: None   Collection Time: 04/29/20  6:16 PM   Specimen: Nasopharyngeal Swab  Result Value Ref Range Status   SARS Coronavirus 2 NEGATIVE NEGATIVE Final    Comment: (NOTE) SARS-CoV-2 target nucleic acids are NOT  DETECTED.  The SARS-CoV-2 RNA is generally detectable in upper and lower respiratory specimens during the acute phase of infection. The lowest concentration of SARS-CoV-2 viral copies this assay can detect is 250 copies / mL. A negative result does not preclude SARS-CoV-2 infection and should not be used as the sole basis for treatment or other patient management decisions.  A negative result may occur with improper specimen collection / handling, submission of specimen other than nasopharyngeal swab, presence of viral mutation(s) within the areas targeted by this assay, and inadequate number of viral copies (<250 copies / mL). A negative result must be combined with clinical observations, patient history, and epidemiological information.  Fact Sheet for Patients:   StrictlyIdeas.no  Fact Sheet for Healthcare Providers: BankingDealers.co.za  This test is not yet approved or  cleared by the [place] FDA and has been authorized for detection and/or diagnosis of SARS-CoV-2 by FDA under an Emergency Use Authorization (EUA).  This EUA will remain in effect (meaning this test can be used) for the duration of the COVID-19 declaration under Section 564(b)(1) of the Act, 21 U.S.C. section 360bbb-3(b)(1), unless the authorization is terminated or revoked sooner.  Performed at [facility], [address], [place], [place] [address]          Radiology Studies: [facility] 1 View  Result Date: 04/29/2020 CLINICAL DATA:  Cough, dyspnea and back pain 4 days. EXAM: PORTABLE CHEST 1 VIEW COMPARISON:  04/16/2020 FINDINGS: Lungs are hypoinflated with opacification over the left base/retrocardiac region likely small effusion with associated atelectasis although infection in the left base is possible. Stable cardiomegaly. Remainder of the exam is unchanged. IMPRESSION: 1. Left base opacification likely small effusion with associated atelectasis,  although infection is possible. 2.  Stable cardiomegaly. Electronically Signed   By: Marin Olp M.D.   On: 04/29/2020 15:26   ECHOCARDIOGRAM COMPLETE  Result Date: 04/30/2020    ECHOCARDIOGRAM REPORT   Patient Name:   TIONNE DAYHOFF Date of Exam: 04/30/2020 Medical Rec #:  [id number]       Height:       69.0 in Accession #:    [id number]      Weight:       284.2 lb Date of Birth:  [birth year]       BSA:  2.399 m Patient Age:    11 years        BP:           147/77 mmHg Patient Gender: M               HR:           87 bpm. Exam Location:  [name] Procedure: 2D Echo Indications:    CHF-Acute Systolic 454.09 / W11.91  History:        Patient has prior history of Echocardiogram examinations. CAD,                 Prior CABG; Risk Factors:Dyslipidemia, Former Smoker, Diabetes                 and Hypertension. Pneumonia Due to Covid 19.  Sonographer:    [name] RDCS (AE) Referring Phys: Love  1. Left ventricular ejection fraction, by estimation, is 60 to 65%. The left ventricle has normal function. The left ventricle has no regional wall motion abnormalities. There is moderate left ventricular hypertrophy. Left ventricular diastolic parameters are consistent with Grade II diastolic dysfunction (pseudonormalization). Elevated left atrial pressure.  2. Right ventricular systolic function is normal. The right ventricular size is normal.  3. The mitral valve is normal in structure. No evidence of mitral valve regurgitation. No evidence of mitral stenosis.  4. The aortic valve has an indeterminant number of cusps. Aortic valve regurgitation is not visualized. No aortic stenosis is present.  5. The inferior vena cava is normal in size with greater than 50% respiratory variability, suggesting right atrial pressure of 3 mmHg. FINDINGS  Left Ventricle: Left ventricular ejection fraction, by estimation, is 60 to 65%. The left ventricle has normal function. The left ventricle has no  regional wall motion abnormalities. The left ventricular internal cavity size was normal in size. There is  moderate left ventricular hypertrophy. Left ventricular diastolic parameters are consistent with Grade II diastolic dysfunction (pseudonormalization). Elevated left atrial pressure. Right Ventricle: The right ventricular size is normal. No increase in right ventricular wall thickness. Right ventricular systolic function is normal. Left Atrium: Left atrial size was normal in size. Right Atrium: Right atrial size was normal in size. Pericardium: There is no evidence of pericardial effusion. Mitral Valve: The mitral valve is normal in structure. No evidence of mitral valve regurgitation. No evidence of mitral valve stenosis. Tricuspid Valve: The tricuspid valve is normal in structure. Tricuspid valve regurgitation is not demonstrated. No evidence of tricuspid stenosis. Aortic Valve: The aortic valve has an indeterminant number of cusps. . There is mild thickening and mild calcification of the aortic valve. Aortic valve regurgitation is not visualized. No aortic stenosis is present. Mild aortic valve annular calcification. There is mild thickening of the aortic valve. There is mild calcification of the aortic valve. Aortic valve mean gradient measures 6.1 mmHg. Aortic valve peak gradient measures 10.9 mmHg. Aortic valve area, by VTI measures 2.15 cm. Pulmonic Valve: The pulmonic valve was not well visualized. Pulmonic valve regurgitation is not visualized. No evidence of pulmonic stenosis. Aorta: The aortic root is normal in size and structure. Venous: The inferior vena cava is normal in size with greater than 50% respiratory variability, suggesting right atrial pressure of 3 mmHg. IAS/Shunts: The interatrial septum was not well visualized.  LEFT VENTRICLE PLAX 2D LVIDd:         4.45 cm  Diastology LVIDs:         2.47 cm  LV e' lateral:   8.81 cm/s LV PW:         1.36 cm  LV E/e' lateral: 12.8 LV IVS:        1.29  cm  LV e' medial:    5.55 cm/s LVOT diam:     2.10 cm  LV E/e' medial:  20.4 LV SV:         74 LV SV Index:   31 LVOT Area:     3.46 cm  RIGHT VENTRICLE RV S prime:     9.79 cm/s LEFT ATRIUM           Index LA diam:      4.30 cm 1.79 cm/m LA Vol (A4C): 46.6 ml 19.43 ml/m  AORTIC VALVE AV Area (Vmax):    2.21 cm AV Area (Vmean):   2.10 cm AV Area (VTI):     2.15 cm AV Vmax:           165.06 cm/s AV Vmean:          116.016 cm/s AV VTI:            0.343 m AV Peak Grad:      10.9 mmHg AV Mean Grad:      6.1 mmHg LVOT Vmax:         105.26 cm/s LVOT Vmean:        70.183 cm/s LVOT VTI:          0.213 m LVOT/AV VTI ratio: 0.62  AORTA Ao Root diam: 3.00 cm MITRAL VALVE MV Area (PHT): 3.77 cm     SHUNTS MV Decel Time: 201 msec     Systemic VTI:  0.21 m MV E velocity: 113.00 cm/s  Systemic Diam: 2.10 cm MV A velocity: 59.70 cm/s MV E/A ratio:  1.89 Carlyle Dolly MD Electronically signed by Carlyle Dolly MD Signature Date/Time: 04/30/2020/2:26:58 PM    Final         Scheduled Meds: . aspirin EC  81 mg Oral Daily  . atorvastatin  80 mg Oral QHS  . azithromycin  500 mg Oral Daily  . clopidogrel  75 mg Oral Daily  . enoxaparin (LOVENOX) injection  60 mg Subcutaneous Q24H  . furosemide  60 mg Intravenous Q12H  . gabapentin  900 mg Oral BID  . insulin aspart  0-20 Units Subcutaneous TID WC  . insulin aspart  0-5 Units Subcutaneous QHS  . insulin aspart protamine- aspart  55 Units Subcutaneous BID WC  . ipratropium-albuterol  3 mL Nebulization TID  . losartan  25 mg Oral Daily  . metoprolol tartrate  25 mg Oral BID  . pantoprazole  40 mg Oral Daily  . potassium chloride  40 mEq Oral BID  . predniSONE  40 mg Oral Q breakfast  . sodium chloride flush  3 mL Intravenous Q12H  . tamsulosin  0.4 mg Oral Daily  . venlafaxine XR  150 mg Oral Q breakfast   Continuous Infusions: . sodium chloride       LOS: 1 day    Time spent: 25 minutes    Edwin Dada, MD [facility] 04/30/2020, 2:45 PM     Please page though [place] or Epic secure chat:  For [employer], Adult nurse

## 2020-04-30 NOTE — Evaluation (Signed)
Physical Therapy Evaluation Patient Details Name: Shane Chambers MRN: [id number] DOB: [birth year] Today's Date: 04/30/2020   History of Present Illness  Shane Chambers is a 59 y.o. 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  Patient presents with 3 to 4 days of worsening shortness of breath, edema in his lower extremities.  Symptoms are worse with exertion and improved with rest.  Additionally, the patient has a wheezy, nonproductive cough.  He has been diagnosed with COPD and uses an albuterol inhaler.  His symptoms have not been improved with albuterol use.  He denies fevers, chills, nausea, vomiting.  Denies chest pain.  He does have orthopnea.  No other provoking or palliating factors.  He did not see his PCP, who gave him a prescription of doxycycline after blood work returned with an elevated white count.    Clinical Impression  Patient functioning near baseline for functional mobility and gait, demonstrates slow labored movement for sitting up at bedside requesting HOB raised, limited to ambulation in room due to c/o fatigue and chronic low back and BLE pain. Patient tolerated sitting up in chair after therapy - RN/NT notified.  Patient will benefit from continued physical therapy in hospital and recommended venue below to increase strength, balance, endurance for safe ADLs and gait.      Follow Up Recommendations Home health PT;Supervision for mobility/OOB;Supervision - Intermittent    Equipment Recommendations  None recommended by PT    Recommendations for Other Services       Precautions / Restrictions Precautions Precautions: Fall Restrictions Weight Bearing Restrictions: No      Mobility  Bed Mobility Overal bed mobility: Needs Assistance Bed Mobility: Supine to Sit     Supine to sit: Supervision;HOB  elevated     General bed mobility comments: required HOB raised, slow labored movement  Transfers Overall transfer level: Needs assistance Equipment used: Rolling walker (2 wheeled) Transfers: Sit to/from [facility] Sit to Stand: Min guard Stand pivot transfers: Min guard       General transfer comment: increased time, labored movement  Ambulation/Gait Ambulation/Gait assistance: Min guard Gait Distance (Feet): 20 Feet Assistive device: Rolling walker (2 wheeled) Gait Pattern/deviations: Decreased step length - right;Decreased step length - left;Decreased stride length Gait velocity: decreased   General Gait Details: slow labored cadence without loss of balance, limited mostly due to c/o fatigue  Stairs            Wheelchair Mobility    Modified Rankin (Stroke Patients Only)       Balance Overall balance assessment: Needs assistance Sitting-balance support: Feet supported;No upper extremity supported Sitting balance-Leahy Scale: Good Sitting balance - Comments: seated at EOB   Standing balance support: During functional activity;Bilateral upper extremity supported Standing balance-Leahy Scale: Fair Standing balance comment: using RW                             Pertinent Vitals/Pain Pain Assessment: 0-10 Pain Score: 8  Pain Location: low back and legs Pain Descriptors / Indicators: Sore;Grimacing;Aching Pain Intervention(s): Limited activity within patient's tolerance;Monitored during session    Home Living Family/patient expects to be discharged to:: Private residence Living Arrangements: Parent;Other relatives Available Help at Discharge: Family;Available PRN/intermittently Type of Home: Mobile home Home Access: Stairs to enter Entrance Stairs-Rails: Right Entrance Stairs-Number  of Steps: 5-6 Home Layout: One level Home Equipment: [place] - 2 wheels;Cane - quad;Bedside commode;Wheelchair - manual Additional Comments: bird  bathes with microwaved water per pt as water doesnt work at home    Prior Function Level of Independence: Secondary school teacher / Transfers Assistance Needed: household ambulator using RW, uses Emergency planning/management officer where available when shopping  ADL's / Homemaking Assistance Needed: assisted by family        Hand Dominance   Dominant Hand: Right    Extremity/Trunk Assessment   Upper Extremity Assessment Upper Extremity Assessment: Generalized weakness    Lower Extremity Assessment Lower Extremity Assessment: Generalized weakness    Cervical / Trunk Assessment Cervical / Trunk Assessment: Normal  Communication   Communication: No difficulties  Cognition Arousal/Alertness: Awake/alert Behavior During Therapy: WFL for tasks assessed/performed Overall Cognitive Status: Within Functional Limits for tasks assessed                                        General Comments      Exercises     Assessment/Plan    PT Assessment Patient needs continued PT services  PT Problem List Decreased strength;Decreased activity tolerance;Decreased balance;Decreased mobility       PT Treatment Interventions Gait training;Stair training;Functional mobility training;Therapeutic activities;Patient/family education    PT Goals (Current goals can be found in the Care Plan section)  Acute Rehab PT Goals Patient Stated Goal: return home with family to assist PT Goal Formulation: With patient Time For Goal Achievement: 05/04/20 Potential to Achieve Goals: Good    Frequency Min 3X/week   Barriers to discharge        Co-evaluation               AM-PAC PT "6 Clicks" Mobility  Outcome Measure Help needed turning from your back to your side while in a flat bed without using bedrails?: None Help needed moving from lying on your back to sitting on the side of a flat bed without using bedrails?: A Little Help needed moving to and from a bed to a chair (including a  wheelchair)?: A Little Help needed standing up from a chair using your arms (e.g., wheelchair or bedside chair)?: A Little Help needed to walk in hospital room?: A Little Help needed climbing 3-5 steps with a railing? : A Lot 6 Click Score: 18    End of Session   Activity Tolerance: Patient tolerated treatment well;Patient limited by fatigue Patient left: in chair;with call bell/phone within reach Nurse Communication: Mobility status PT Visit Diagnosis: Unsteadiness on feet (R26.81);Other abnormalities of gait and mobility (R26.89);Muscle weakness (generalized) (M62.81)    Time: 7425-9563 PT Time Calculation (min) (ACUTE ONLY): 24 min   Charges:   PT Evaluation $PT Eval Moderate Complexity: 1 Mod PT Treatments $Therapeutic Activity: 23-37 mins        11:19 AM, 04/30/20 Lonell Grandchild, MPT Physical Therapist with [facility] [phone] office [phone] mobile phone

## 2020-04-30 NOTE — TOC Progression Note (Addendum)
Transition of Care [facility]) - Progression Note    Patient Details  Name: Shane Chambers MRN: [id number] Date of Birth: [birth year]  Transition of Care [facility]) CM/SW Contact  Boneta Lucks, RN Phone Number: 04/30/2020, 12:13 PM  Clinical Narrative:   Patient admitted with acute respiratory failure with hypoxia. PT is recommending HHPT. Patient has medicaid. TOC was able to pair to get patient HHPT with [facility].     Expected Discharge Plan: [place] Barriers to Discharge: Continued Medical Work up  Expected Discharge Plan and Services Expected Discharge Plan: [place]: [facility] ([facility]) Date [place]: 04/30/20 Time [place]: 1143 Representative spoke with at [place]: [place]   Readmission Risk Interventions Readmission Risk Prevention Plan 03/07/2020 12/06/2019  Transportation Screening Complete Complete  PCP or Specialist Appt within 3-5 Days - Not Complete  HRI or [place] - Not Complete  Social Work Consult for [place] Planning/Counseling - Not Complete  Palliative Care Screening - Not Complete  Medication Review Press photographer) Complete Complete  Some recent data might be hidden

## 2020-04-30 NOTE — Progress Notes (Addendum)
Instructed patient on IS usage.  Patient stated he has used the IS before.  Had patient try using it, patient was able to reach 1250 ml.  Patient did 10X, with good patient effort at first, but patient stated he could not reach that goal again after first couple of tries.  Left IS at bedside for patient.

## 2020-05-01 LAB — BASIC METABOLIC PANEL
Anion gap: 9 (ref 5–15)
BUN: 27 mg/dL — ABNORMAL HIGH (ref 6–20)
CO2: 30 mmol/L (ref 22–32)
Calcium: 9.2 mg/dL (ref 8.9–10.3)
Chloride: 97 mmol/L — ABNORMAL LOW (ref 98–111)
Creatinine, Ser: 0.82 mg/dL (ref 0.61–1.24)
GFR calc Af Amer: >60 mL/min (ref >60)
GFR calc non Af Amer: >60 mL/min (ref >60)
Glucose, Bld: 95 mg/dL (ref 70–99)
Potassium: 3.9 mmol/L (ref 3.5–5.1)
Sodium: 136 mmol/L (ref 135–145)

## 2020-05-01 LAB — CBC
HCT: 30.4 % — ABNORMAL LOW (ref 39.0–52.0)
Hemoglobin: 9.2 g/dL — ABNORMAL LOW (ref 13.0–17.0)
MCH: 25.1 pg — ABNORMAL LOW (ref 26.0–34.0)
MCHC: 30.3 g/dL (ref 30.0–36.0)
MCV: 83.1 fL (ref 80.0–100.0)
Platelets: 567 10*3/uL — ABNORMAL HIGH (ref 150–400)
RBC: 3.66 MIL/uL — ABNORMAL LOW (ref 4.22–5.81)
RDW: 14.5 % (ref 11.5–15.5)
WBC: 19.7 10*3/uL — ABNORMAL HIGH (ref 4.0–10.5)
nRBC: 0 % (ref 0.0–0.2)

## 2020-05-01 LAB — RETICULOCYTES
Immature Retic Fract: 31.3 % — ABNORMAL HIGH (ref 2.3–15.9)
RBC.: 3.64 MIL/uL — ABNORMAL LOW (ref 4.22–5.81)
Retic Count, Absolute: 59.3 10*3/uL (ref 19.0–186.0)
Retic Ct Pct: 1.6 % (ref 0.4–3.1)

## 2020-05-01 LAB — GLUCOSE, CAPILLARY
Glucose-Capillary: 83 mg/dL (ref 70–99)
Glucose-Capillary: 172 mg/dL — ABNORMAL HIGH (ref 70–99)
Glucose-Capillary: 216 mg/dL — ABNORMAL HIGH (ref 70–99)
Glucose-Capillary: 246 mg/dL — ABNORMAL HIGH (ref 70–99)

## 2020-05-01 LAB — IRON AND TIBC
Iron: 24 ug/dL — ABNORMAL LOW (ref 45–182)
Saturation Ratios: 7 % — ABNORMAL LOW (ref 17.9–39.5)
TIBC: 341 ug/dL (ref 250–450)
UIBC: 317 ug/dL

## 2020-05-01 LAB — FERRITIN: Ferritin: 158 ng/mL (ref 24–336)

## 2020-05-01 MED ORDER — HYDROCOD POLST-CPM POLST ER 10-8 MG/5ML PO SUER
5.0000 mL | Freq: Two times a day (BID) | ORAL | Status: DC | PRN
  Administered 2020-05-01 – 2020-05-03 (×4): 5 mL via ORAL
  Filled 2020-05-01 (×5): qty 5

## 2020-05-01 MED ORDER — GUAIFENESIN-DM 100-10 MG/5ML PO SYRP
5.0000 mL | ORAL_SOLUTION | ORAL | Status: DC | PRN
  Administered 2020-05-01 – 2020-05-03 (×3): 5 mL via ORAL
  Filled 2020-05-01 (×3): qty 5

## 2020-05-01 MED ORDER — PROMETHAZINE-CODEINE 6.25-10 MG/5ML PO SYRP: 5.0000 mL | ORAL_SOLUTION | Freq: Four times a day (QID) | ORAL | Status: DC | PRN

## 2020-05-01 MED ORDER — FERROUS SULFATE 325 (65 FE) MG PO TABS
325.0000 mg | ORAL_TABLET | ORAL | Status: DC
  Administered 2020-05-01 – 2020-05-03 (×2): 325 mg via ORAL
  Filled 2020-05-01 (×2): qty 1

## 2020-05-01 NOTE — Progress Notes (Signed)
Physical Therapy Treatment Patient Details Name: Shane Chambers MRN: [id number] DOB: 11-05-1961 Today's Date: 05/01/2020    History of Present Illness LINAS STEPTER is a 59 y.o. 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  Patient presents with 3 to 4 days of worsening shortness of breath, edema in his lower extremities.  Symptoms are worse with exertion and improved with rest.  Additionally, the patient has a wheezy, nonproductive cough.  He has been diagnosed with COPD and uses an albuterol inhaler.  His symptoms have not been improved with albuterol use.  He denies fevers, chills, nausea, vomiting.  Denies chest pain.  He does have orthopnea.  No other provoking or palliating factors.  He did not see his PCP, who gave him a prescription of doxycycline after blood work returned with an elevated white count.    PT Comments    Patient demonstrates slight improvement for sitting up at bedside and ambulating in room without loss of balance, continues to require HOB raised during supine to sitting and tolerated sitting up in chair after therapy.  Patient will benefit from continued physical therapy in hospital and recommended venue below to increase strength, balance, endurance for safe ADLs and gait.   Follow Up Recommendations  Home health PT;Supervision for mobility/OOB;Supervision - Intermittent     Equipment Recommendations  None recommended by PT    Recommendations for Other Services       Precautions / Restrictions Precautions Precautions: Fall Restrictions Weight Bearing Restrictions: No    Mobility  Bed Mobility Overal bed mobility: Needs Assistance Bed Mobility: Supine to Sit     Supine to sit: Supervision;HOB elevated     General bed mobility comments: required HOB raised, slow labored  movement  Transfers Overall transfer level: Needs assistance Equipment used: Rolling walker (2 wheeled) Transfers: Sit to/from [facility] Sit to Stand: Supervision;Min guard Stand pivot transfers: Supervision;Min guard       General transfer comment: increased time, labored movement  Ambulation/Gait Ambulation/Gait assistance: Min guard Gait Distance (Feet): 22 Feet Assistive device: Rolling walker (2 wheeled) Gait Pattern/deviations: Decreased step length - right;Decreased step length - left;Decreased stride length Gait velocity: decreased   General Gait Details: slight increased endurance/distance for ambulation with slow labored cadence without loss of balance, limited mostly due to c/o fatigue   Stairs             Wheelchair Mobility    Modified Rankin (Stroke Patients Only)       Balance Overall balance assessment: Needs assistance Sitting-balance support: Feet supported;No upper extremity supported Sitting balance-Leahy Scale: Good Sitting balance - Comments: seated at EOB   Standing balance support: During functional activity;Bilateral upper extremity supported Standing balance-Leahy Scale: Fair Standing balance comment: using RW                            Cognition Arousal/Alertness: Awake/alert Behavior During Therapy: WFL for tasks assessed/performed Overall Cognitive Status: Within Functional Limits for tasks assessed                                        Exercises      General Comments        Pertinent Vitals/Pain  Pain Assessment: Faces Faces Pain Scale: Hurts a little bit Pain Location: low back and legs Pain Descriptors / Indicators: Sore;Aching Pain Intervention(s): Limited activity within patient's tolerance;Monitored during session    Home Living                      Prior Function            PT Goals (current goals can now be found in the care plan section) Acute Rehab  PT Goals Patient Stated Goal: return home with family to assist PT Goal Formulation: With patient Time For Goal Achievement: 05/04/20 Potential to Achieve Goals: Good Progress towards PT goals: Progressing toward goals    Frequency    Min 3X/week      PT Plan      Co-evaluation              AM-PAC PT "6 Clicks" Mobility   Outcome Measure  Help needed turning from your back to your side while in a flat bed without using bedrails?: None Help needed moving from lying on your back to sitting on the side of a flat bed without using bedrails?: A Little Help needed moving to and from a bed to a chair (including a wheelchair)?: A Little Help needed standing up from a chair using your arms (e.g., wheelchair or bedside chair)?: A Little Help needed to walk in hospital room?: A Little Help needed climbing 3-5 steps with a railing? : A Lot 6 Click Score: 18    End of Session   Activity Tolerance: Patient tolerated treatment well;Patient limited by fatigue Patient left: in chair;with call bell/phone within reach Nurse Communication: Mobility status PT Visit Diagnosis: Unsteadiness on feet (R26.81);Other abnormalities of gait and mobility (R26.89);Muscle weakness (generalized) (M62.81)     Time: 9892-1194 PT Time Calculation (min) (ACUTE ONLY): 29 min  Charges:  $Therapeutic Exercise: 8-22 mins $Therapeutic Activity: 8-22 mins                     2:10 PM, 05/01/20 Lonell Grandchild, MPT Physical Therapist with [facility] [phone] office [phone] mobile phone

## 2020-05-01 NOTE — Progress Notes (Signed)
OT Cancellation Note  Patient Details Name: Shane Chambers MRN: [id number] DOB: 07-06-1961   Cancelled Treatment:    Reason Eval/Treat Not Completed: Other (comment) (Eating breakfast and would like to finish. Will return as schedule allows. Thank you.)  [place], OTR/L Acute Rehab Pager: [phone] Office: [phone] 05/01/2020, 8:27 AM

## 2020-05-01 NOTE — Evaluation (Signed)
Occupational Therapy Evaluation Patient Details Name: Shane Chambers MRN: [id number] DOB: June 28, 1961 Today's Date: 05/01/2020    History of Present Illness 59 yo male presenting with 3 to 4 days of worsening shortness of breath, edema in his lower extremities. PMH including diabetes, HTN, COPD, coronary artery disease with ischemic cardiomyopathy and systolic heart failure with EF of 30 to 35% and coronary artery bypass graft x5 vessels in May 2021.   Clinical Impression   PTA, pt was living with his mother, sister, and niece and performed BADLs with assistance from family as needed for LB ADLs and FM tasks. Pt currently requiring Min A for LB ADLs and Min guard A for functional mobility with RW. Pt presenting with limited FM skills, decreased balance, and poor activity tolerance. Providing education on energy conservation for ADLs. Also providing information on shower seated and built up tubing for grooming/self feeding. Pt would benefit from further acute OT to facilitate safe dc. Recommend dc to home once medically stable per physician.      Follow Up Recommendations  No OT follow up;Supervision - Intermittent    Equipment Recommendations  None recommended by OT    Recommendations for Other Services PT consult     Precautions / Restrictions Precautions Precautions: Fall      Mobility Bed Mobility Overal bed mobility: Needs Assistance Bed Mobility: Sit to Supine       Sit to supine: Supervision   General bed mobility comments: SUpervision and increased time  Transfers Overall transfer level: Needs assistance Equipment used: Rolling walker (2 wheeled) Transfers: Sit to/from Stand Sit to Stand: Supervision         General transfer comment: supervision for safety. cues for hand placement    Balance Overall balance assessment: Needs assistance Sitting-balance support: Feet supported;No upper extremity supported Sitting balance-Leahy Scale: Good     Standing  balance support: Bilateral upper extremity supported;During functional activity Standing balance-Leahy Scale: Poor Standing balance comment: reliant on UE support                           ADL either performed or assessed with clinical judgement   ADL Overall ADL's : Needs assistance/impaired Eating/Feeding: Set up;Sitting Eating/Feeding Details (indicate cue type and reason): Able to perform self feeding with set up. However, dropping items and having difficulty managing utensils. Would benefit from built up handle. Educated pt on how to purchase built up handles.  Grooming: Set up;Sitting;Oral care Grooming Details (indicate cue type and reason): Pt able to perform oral care at sink with set up. Pt with decreased FM skills and requiring sinificant time Upper Body Bathing: Set up;Sitting   Lower Body Bathing: Minimal assistance;Sit to/from stand   Upper Body Dressing : Set up;Sitting   Lower Body Dressing: Minimal assistance;Sitting/lateral leans Lower Body Dressing Details (indicate cue type and reason): Pt able to use figure four method for donning socks. Min A to bring socks over heels due to increased edema.  Toilet Transfer: Supervision/safety;BSC           Functional mobility during ADLs: Min guard;Rolling walker General ADL Comments: Pt presenting with decreased actiivyt tolernace and fatigues quickly. Providing handout on energy conservation and reviewing. Also providing handouts on shower seats and built up tubing for grooming/feeding     Vision Baseline Vision/History: Wears glasses Wears Glasses: At all times Patient Visual Report: No change from baseline       Perception     Praxis  Pertinent Vitals/Pain Pain Assessment: 0-10 Pain Score: 7  Pain Location: BLEs Pain Descriptors / Indicators: Sore;Aching Pain Intervention(s): Monitored during session;Limited activity within patient's tolerance;Repositioned     Hand Dominance Right    Extremity/Trunk Assessment Upper Extremity Assessment Upper Extremity Assessment: [name] deficits/detail [name] Deficits / Details: Trigger finger at 3rd digit. Weakness and decreased FM skills. Poor grasp and pinch strength as seen during oral care.  [name] Coordination: decreased fine motor   Lower Extremity Assessment Lower Extremity Assessment: Defer to PT evaluation   Cervical / Trunk Assessment Cervical / Trunk Assessment: Other exceptions Cervical / Trunk Exceptions: Increased body habitus   Communication Communication Communication: No difficulties   Cognition Arousal/Alertness: Awake/alert Behavior During Therapy: WFL for tasks assessed/performed Overall Cognitive Status: Within Functional Limits for tasks assessed                                     General Comments  VSS    Exercises     Shoulder Instructions      Home Living Family/patient expects to be discharged to:: Private residence Living Arrangements: Parent;Other relatives (Mom (73), Sister, and niece (54)) Available Help at Discharge: Family;Available PRN/intermittently Type of Home: Mobile home Home Access: Stairs to enter Entrance Stairs-Number of Steps: 5-6 Entrance Stairs-Rails: Right Home Layout: One level     Bathroom Shower/Tub: Tub/shower unit;Door   Bathroom Toilet: Standard (Uses BSC beside toilet; toilet doesnt work.) Bathroom Accessibility: Yes   Home Equipment: Environmental consultant - 2 wheels;Cane - quad;Bedside commode;Wheelchair - manual   Additional Comments: bird bathes with microwaved water per pt as water doesnt work at home      Prior Functioning/Environment Level of Independence: Needs assistance  Gait / Transfers Assistance Needed: household ambulator using RW, uses electric scooters where available when shopping ADL's / Homemaking Assistance Needed: Performs BADLs with increased time due to fatigue. Mom will assist with LB dressing due to limited ROM and acitivty tolerance. Also  assisting with button down shirt due to decreased FM skills            OT Problem List: Decreased strength;Decreased activity tolerance;Impaired balance (sitting and/or standing);Decreased range of motion;Decreased knowledge of use of DME or AE;Decreased knowledge of precautions;Pain      OT Treatment/Interventions: Self-care/ADL training;Therapeutic exercise;Energy conservation;DME and/or AE instruction;Therapeutic activities;Patient/family education    OT Goals(Current goals can be found in the care plan section) Acute Rehab OT Goals Patient Stated Goal: Return home OT Goal Formulation: With patient Time For Goal Achievement: 05/15/20 Potential to Achieve Goals: Good  OT Frequency: Min 2X/week   Barriers to D/C:            Co-evaluation              AM-PAC OT "6 Clicks" Daily Activity     Outcome Measure Help from another person eating meals?: A Little Help from another person taking care of personal grooming?: A Little Help from another person toileting, which includes using toliet, bedpan, or urinal?: A Little Help from another person bathing (including washing, rinsing, drying)?: A Little Help from another person to put on and taking off regular upper body clothing?: None Help from another person to put on and taking off regular lower body clothing?: A Little 6 Click Score: 19   End of Session Equipment Utilized During Treatment: Rolling walker Nurse Communication: Mobility status  Activity Tolerance: Patient tolerated treatment well Patient left: with call bell/phone  within reach;in bed  OT Visit Diagnosis: Unsteadiness on feet (R26.81);Other abnormalities of gait and mobility (R26.89);Pain Pain - Right/Left:  (bilateral) Pain - part of body: Leg                Time: 1741-1830 OT Time Calculation (min): 49 min Charges:  OT General Charges $OT Visit: 1 Visit OT Evaluation $OT Eval Moderate Complexity: 1 Mod OT Treatments $Self Care/Home Management :  23-37 mins  Jafeth Mustin MSOT, OTR/L Acute Rehab Pager: [place] 05/01/2020, 6:40 PM

## 2020-05-01 NOTE — Progress Notes (Signed)
SATURATION QUALIFICATIONS: (This note is used to comply with regulatory documentation for home oxygen)  Patient Saturations on Room Air at Rest = 97%  Patient Saturations on Room Air while Ambulating = 98  Patient Saturations on 0 Liters of oxygen while Ambulating = NA  Please briefly explain why patient needs home oxygen:

## 2020-05-01 NOTE — Progress Notes (Signed)
PROGRESS NOTE    Shane Chambers  XBM:[id number] DOB: 06-08-1961 DOA: 04/29/2020 PCP: [name], MD      Brief Narrative:  [name] is a 59 y.o. M with obesity BMI 42, DM, HTN, COVID-19 in Jan 2021, CAD s/p CABG in May 2021 by Dr. Orvan Seen, and possible COPD who presented with several days progressive cough, SOB.  In the ER, hypoxic to 88%, BNP slightly elevated, CXR showed left base opacity, WBC 17K.  Started on steroids, bronchodilators and Lasix and admitted for COPD flare and CHF flare.         Assessment & Plan:  Acute respiratory failure with hypoxia due to acute on chronic diastolic CHF Patient without significant CHF in past, but had MI with CABG last month.  Now with elevated BNP, orthopnea, possible edema on CXR.  Echocardiogram here shows normal EF, grade II DD.    Net negative 1000 ml yesterday.  Cr and K stable, still swollen.    -Continue Lasix 80 BID -Continue K supplement -Strict I/Os, daily weights, telemetry  -Daily monitoring renal function     COPD exacerbation, likely Obesity hypoventilation syndrome, likely Patient remote smoker.  No real chronic bronchitis or dyspnea symptoms until 2018 when he moved in with his mother, after which noticed more frequent bronchitis episodes, maybe? chronic cough.    Major change in his respiratory status was in Jan 2021, had COVID, admitted for 5 days on steroids, remdesivir, peak O2 requirement 2L.  Since then, he has had chronic dyspnea on exertion, feeling out of breath, fatigued.    PFTs reviewed (done during May admission for CABG), normal FEV1/FVC ratio, although both reduced.  ?OHS + COPD, needs Pulm referral after discharge.  Here, he was somewhat wheezy for prior providers, seems to feel better on steroids and bronchodilators  Still coughing today. -Continue bronchodilators -Continue azithromycin, day 3 of 5 -Continue prednisone day 3 of 5  -Has follow up with Pulmonology scheduled with Dr. Elsworth Soho on  05/24/2020 at 11:50am     Pneumonia, likely His CXR shows Left base opacity, WBC is up and primary symptomatology on presentation was cough, so I think coverage of CAP is appropriate.  He is improving with azithromycin, and so will stick with this, don't think broadening to typical CAP coverage is completely necessary.  No new fever.  WBC up from steroids.  Still seems to be improving but not back to baseline. -Continue azithromycin day 3 of 5  Obesity BMI 42 -Continue pantoprazole  Hypertension Coronary disease secondary prevention, CABG in May BP controlled -Continue aspirin, atorvastatin, Plavix -Continue losartan, metoprolol  Diabetes with polyneuropathy Glucoses improved -Continue SS corrections -Continue home 70/30 insulin  -Continue gabapentin -Hold home metformin  Anemia, probably iron deficiency Near microcytic.  Hgb lower than 10 g/dL range post-CABG.  No clinical bleeding.  Ferritin normal but iron sat low. -Start iron, repeat Hgb and iron studies in 3 months  Mood disorder -Continue venlafaxine  BPH -Continue Flomax         Disposition: Status is: Inpatient  Remains inpatient appropriate because:this is a severe exacerbation of CHF requiring supplemental O2 and he is still obviously overloaded on exam   Dispo:  Patient From: Home  Planned Disposition: Home with Health Care Svc  Expected discharge date: 05/02/20  Medically stable for discharge: No   Patient weaned off of oxygen at rest.  However he still is obviously fluid overloaded on exam.  Will walk today, if swelling improved, and able to  ambulate without oxygen, home tomorrow.             MDM: The below labs and imaging reports reviewed and summarized above.  Medication management as above.    DVT prophylaxis: Lovenox  Code Status: FULL Family Communication:     Consultants:     Procedures:   6/21 Echo -- EF normal, Grade II DD  Antimicrobials:    Azithromycin  6/20 >>  Culture data:              Subjective: No fever overnight, still very swollen legs, worse than yesterday.  Cough bad overnight.  No sputum, no chest pain, no confusion.  Objective: Vitals:   04/30/20 2118 05/01/20 0450 05/01/20 0537 05/01/20 0759  BP: (!) 143/86 129/77    Pulse: 93 80    Resp: 20 20    Temp: 98.7 F (37.1 C) 98 F (36.7 C)    TempSrc: Oral Oral    SpO2: 97% 97% 97% 96%  Weight:  127 kg    Height:        Intake/Output Summary (Last 24 hours) at 05/01/2020 0857 Last data filed at 05/01/2020 0500 Gross per 24 hour  Intake 1160 ml  Output 2300 ml  Net -1140 ml   Filed Weights   04/29/20 2153 04/30/20 0034 05/01/20 0450  Weight: 128.9 kg 128.9 kg 127 kg    Examination: General appearance: Obese adult male, lying in bed, coughing, eating breakfast.     HEENT: Anicteric, conjunctival pink, lids and lashes normal.  No nasal deformity, discharge, or epistaxis. Skin: Skin warm and dry, no suspicious rashes or lesions. Cardiac: Regular rate and rhythm, no murmurs appreciated, JVP not visible due to body habitus, marked 2+ lower extremity edema. Respiratory: Coughing frequently, otherwise normal respiratory rate and rhythm, lung sounds diminished but no rales or wheezing. Abdomen: Abdomen soft without tenderness palpation or guarding, no ascites or distention. MSK:  Neuro: Extraocular movements intact, moves all extremities with generalized weakness but normal coordination, speech fluent. Psych: Attention normal, affect normal, judgment and insight appear normal           Data Reviewed: I have personally reviewed following labs and imaging studies:  CBC: Recent Labs  Lab 04/29/20 1330 05/01/20 0615  WBC 17.6* 19.7*  NEUTROABS 12.4*  --   HGB 8.9* 9.2*  HCT 28.9* 30.4*  MCV 84.3 83.1  PLT 518* 518*   Basic Metabolic Panel: Recent Labs  Lab 04/29/20 1330 04/30/20 0615 05/01/20 0615  NA 136 136 136  K 3.9 4.6 3.9  CL 94*  96* 97*  CO2 27 27 30   GLUCOSE 84 225* 95  BUN 21* 22* 27*  CREATININE 1.00 0.82 0.82  CALCIUM 9.0 8.9 9.2   GFR: Estimated Creatinine Clearance: 131 mL/min (by C-G formula based on SCr of 0.82 mg/dL). Liver Function Tests: No results for input(s): AST, ALT, ALKPHOS, BILITOT, PROT, ALBUMIN in the last 168 hours. No results for input(s): LIPASE, AMYLASE in the last 168 hours. No results for input(s): AMMONIA in the last 168 hours. Coagulation Profile: No results for input(s): INR, PROTIME in the last 168 hours. Cardiac Enzymes: No results for input(s): CKTOTAL, CKMB, CKMBINDEX, TROPONINI in the last 168 hours. BNP (last 3 results) No results for input(s): PROBNP in the last 8760 hours. HbA1C: No results for input(s): HGBA1C in the last 72 hours. CBG: Recent Labs  Lab 04/30/20 0834 04/30/20 1140 04/30/20 1655 04/30/20 2119 05/01/20 0735  GLUCAP 187* 188* 229* 247* 83  Lipid Profile: No results for input(s): CHOL, HDL, LDLCALC, TRIG, CHOLHDL, LDLDIRECT in the last 72 hours. Thyroid Function Tests: No results for input(s): TSH, T4TOTAL, FREET4, T3FREE, THYROIDAB in the last 72 hours. Anemia Panel: Recent Labs    05/01/20 0615  FERRITIN 158  TIBC 341  IRON 24*  RETICCTPCT 1.6   Urine analysis:    Component Value Date/Time   COLORURINE YELLOW 03/05/2020 1251   APPEARANCEUR CLEAR 03/05/2020 1251   LABSPEC 1.014 03/05/2020 1251   PHURINE 7.0 03/05/2020 1251   GLUCOSEU >=500 (A) 03/05/2020 1251   HGBUR NEGATIVE 03/05/2020 1251   BILIRUBINUR NEGATIVE 03/05/2020 1251   KETONESUR NEGATIVE 03/05/2020 1251   PROTEINUR 100 (A) 03/05/2020 1251   NITRITE NEGATIVE 03/05/2020 1251   LEUKOCYTESUR NEGATIVE 03/05/2020 1251   Sepsis Labs: @LABRCNTIP (procalcitonin:4,lacticacidven:4)  ) Recent Results (from the past 240 hour(s))  SARS Coronavirus 2 by RT PCR (hospital order, performed in [facility] hospital lab) Nasopharyngeal Nasopharyngeal Swab     Status: None    Collection Time: 04/29/20  6:16 PM   Specimen: Nasopharyngeal Swab  Result Value Ref Range Status   SARS Coronavirus 2 NEGATIVE NEGATIVE Final    Comment: (NOTE) SARS-CoV-2 target nucleic acids are NOT DETECTED.  The SARS-CoV-2 RNA is generally detectable in upper and lower respiratory specimens during the acute phase of infection. The lowest concentration of SARS-CoV-2 viral copies this assay can detect is 250 copies / mL. A negative result does not preclude SARS-CoV-2 infection and should not be used as the sole basis for treatment or other patient management decisions.  A negative result may occur with improper specimen collection / handling, submission of specimen other than nasopharyngeal swab, presence of viral mutation(s) within the areas targeted by this assay, and inadequate number of viral copies (<250 copies / mL). A negative result must be combined with clinical observations, patient history, and epidemiological information.  Fact Sheet for Patients:   StrictlyIdeas.no  Fact Sheet for Healthcare Providers: BankingDealers.co.za  This test is not yet approved or  cleared by the [place] FDA and has been authorized for detection and/or diagnosis of SARS-CoV-2 by FDA under an Emergency Use Authorization (EUA).  This EUA will remain in effect (meaning this test can be used) for the duration of the COVID-19 declaration under Section 564(b)(1) of the Act, 21 U.S.C. section 360bbb-3(b)(1), unless the authorization is terminated or revoked sooner.  Performed at [facility], [address], [place], [place] [address]          Radiology Studies: [facility] 1 View  Result Date: 04/29/2020 CLINICAL DATA:  Cough, dyspnea and back pain 4 days. EXAM: PORTABLE CHEST 1 VIEW COMPARISON:  04/16/2020 FINDINGS: Lungs are hypoinflated with opacification over the left base/retrocardiac region likely small effusion with associated  atelectasis although infection in the left base is possible. Stable cardiomegaly. Remainder of the exam is unchanged. IMPRESSION: 1. Left base opacification likely small effusion with associated atelectasis, although infection is possible. 2.  Stable cardiomegaly. Electronically Signed   By: Marin Olp M.D.   On: 04/29/2020 15:26   ECHOCARDIOGRAM COMPLETE  Result Date: 04/30/2020    ECHOCARDIOGRAM REPORT   Patient Name:   Shane Chambers Date of Exam: 04/30/2020 Medical Rec #:  [id number]       Height:       69.0 in Accession #:    [id number]      Weight:       284.2 lb Date of Birth:  12-Dec-1960  BSA:          2.399 m Patient Age:    20 years        BP:           147/77 mmHg Patient Gender: M               HR:           87 bpm. Exam Location:  [name] Procedure: 2D Echo Indications:    CHF-Acute Systolic 161.09 / U04.54  History:        Patient has prior history of Echocardiogram examinations. CAD,                 Prior CABG; Risk Factors:Dyslipidemia, Former Smoker, Diabetes                 and Hypertension. Pneumonia Due to Covid 19.  Sonographer:    [name] RDCS (AE) Referring Phys: [place]  1. Left ventricular ejection fraction, by estimation, is 60 to 65%. The left ventricle has normal function. The left ventricle has no regional wall motion abnormalities. There is moderate left ventricular hypertrophy. Left ventricular diastolic parameters are consistent with Grade II diastolic dysfunction (pseudonormalization). Elevated left atrial pressure.  2. Right ventricular systolic function is normal. The right ventricular size is normal.  3. The mitral valve is normal in structure. No evidence of mitral valve regurgitation. No evidence of mitral stenosis.  4. The aortic valve has an indeterminant number of cusps. Aortic valve regurgitation is not visualized. No aortic stenosis is present.  5. The inferior vena cava is normal in size with greater than 50% respiratory  variability, suggesting right atrial pressure of 3 mmHg. FINDINGS  Left Ventricle: Left ventricular ejection fraction, by estimation, is 60 to 65%. The left ventricle has normal function. The left ventricle has no regional wall motion abnormalities. The left ventricular internal cavity size was normal in size. There is  moderate left ventricular hypertrophy. Left ventricular diastolic parameters are consistent with Grade II diastolic dysfunction (pseudonormalization). Elevated left atrial pressure. Right Ventricle: The right ventricular size is normal. No increase in right ventricular wall thickness. Right ventricular systolic function is normal. Left Atrium: Left atrial size was normal in size. Right Atrium: Right atrial size was normal in size. Pericardium: There is no evidence of pericardial effusion. Mitral Valve: The mitral valve is normal in structure. No evidence of mitral valve regurgitation. No evidence of mitral valve stenosis. Tricuspid Valve: The tricuspid valve is normal in structure. Tricuspid valve regurgitation is not demonstrated. No evidence of tricuspid stenosis. Aortic Valve: The aortic valve has an indeterminant number of cusps. . There is mild thickening and mild calcification of the aortic valve. Aortic valve regurgitation is not visualized. No aortic stenosis is present. Mild aortic valve annular calcification. There is mild thickening of the aortic valve. There is mild calcification of the aortic valve. Aortic valve mean gradient measures 6.1 mmHg. Aortic valve peak gradient measures 10.9 mmHg. Aortic valve area, by VTI measures 2.15 cm. Pulmonic Valve: The pulmonic valve was not well visualized. Pulmonic valve regurgitation is not visualized. No evidence of pulmonic stenosis. Aorta: The aortic root is normal in size and structure. Venous: The inferior vena cava is normal in size with greater than 50% respiratory variability, suggesting right atrial pressure of 3 mmHg. IAS/Shunts: The  interatrial septum was not well visualized.  LEFT VENTRICLE PLAX 2D LVIDd:         4.45 cm  Diastology  LVIDs:         2.47 cm  LV e' lateral:   8.81 cm/s LV PW:         1.36 cm  LV E/e' lateral: 12.8 LV IVS:        1.29 cm  LV e' medial:    5.55 cm/s LVOT diam:     2.10 cm  LV E/e' medial:  20.4 LV SV:         74 LV SV Index:   31 LVOT Area:     3.46 cm  RIGHT VENTRICLE RV S prime:     9.79 cm/s LEFT ATRIUM           Index LA diam:      4.30 cm 1.79 cm/m LA Vol (A4C): 46.6 ml 19.43 ml/m  AORTIC VALVE AV Area (Vmax):    2.21 cm AV Area (Vmean):   2.10 cm AV Area (VTI):     2.15 cm AV Vmax:           165.06 cm/s AV Vmean:          116.016 cm/s AV VTI:            0.343 m AV Peak Grad:      10.9 mmHg AV Mean Grad:      6.1 mmHg LVOT Vmax:         105.26 cm/s LVOT Vmean:        70.183 cm/s LVOT VTI:          0.213 m LVOT/AV VTI ratio: 0.62  AORTA Ao Root diam: 3.00 cm MITRAL VALVE MV Area (PHT): 3.77 cm     SHUNTS MV Decel Time: 201 msec     Systemic VTI:  0.21 m MV E velocity: 113.00 cm/s  Systemic Diam: 2.10 cm MV A velocity: 59.70 cm/s MV E/A ratio:  1.89 Carlyle Dolly MD Electronically signed by Carlyle Dolly MD Signature Date/Time: 04/30/2020/2:26:58 PM    Final         Scheduled Meds: . aspirin EC  81 mg Oral Daily  . atorvastatin  80 mg Oral QHS  . azithromycin  500 mg Oral Daily  . clopidogrel  75 mg Oral Daily  . enoxaparin (LOVENOX) injection  60 mg Subcutaneous Q24H  . furosemide  80 mg Intravenous Q12H  . gabapentin  900 mg Oral BID  . insulin aspart  0-20 Units Subcutaneous TID WC  . insulin aspart  0-5 Units Subcutaneous QHS  . insulin aspart protamine- aspart  55 Units Subcutaneous BID WC  . ipratropium-albuterol  3 mL Nebulization TID  . losartan  25 mg Oral Daily  . metoprolol tartrate  25 mg Oral BID  . pantoprazole  40 mg Oral Daily  . potassium chloride  40 mEq Oral BID  . predniSONE  40 mg Oral Q breakfast  . sodium chloride flush  3 mL Intravenous Q12H  .  tamsulosin  0.4 mg Oral Daily  . venlafaxine XR  150 mg Oral Q breakfast   Continuous Infusions: . sodium chloride       LOS: 2 days    Time spent: 25  minutes    Edwin Dada, MD [facility] 05/01/2020, 8:57 AM     Please page though [employer] or Epic secure chat:  For [employer], Adult nurse

## 2020-05-02 LAB — BASIC METABOLIC PANEL
Anion gap: 12 (ref 5–15)
BUN: 31 mg/dL — ABNORMAL HIGH (ref 6–20)
CO2: 30 mmol/L (ref 22–32)
Calcium: 9.1 mg/dL (ref 8.9–10.3)
Chloride: 95 mmol/L — ABNORMAL LOW (ref 98–111)
Creatinine, Ser: 0.88 mg/dL (ref 0.61–1.24)
GFR calc Af Amer: >60 mL/min (ref >60)
GFR calc non Af Amer: >60 mL/min (ref >60)
Glucose, Bld: 84 mg/dL (ref 70–99)
Potassium: 3.8 mmol/L (ref 3.5–5.1)
Sodium: 137 mmol/L (ref 135–145)

## 2020-05-02 LAB — CBC
HCT: 29.7 % — ABNORMAL LOW (ref 39.0–52.0)
Hemoglobin: 9 g/dL — ABNORMAL LOW (ref 13.0–17.0)
MCH: 25.2 pg — ABNORMAL LOW (ref 26.0–34.0)
MCHC: 30.3 g/dL (ref 30.0–36.0)
MCV: 83.2 fL (ref 80.0–100.0)
Platelets: 540 10*3/uL — ABNORMAL HIGH (ref 150–400)
RBC: 3.57 MIL/uL — ABNORMAL LOW (ref 4.22–5.81)
RDW: 14.5 % (ref 11.5–15.5)
WBC: 19.5 10*3/uL — ABNORMAL HIGH (ref 4.0–10.5)
nRBC: 0 % (ref 0.0–0.2)

## 2020-05-02 LAB — GLUCOSE, CAPILLARY
Glucose-Capillary: 81 mg/dL (ref 70–99)
Glucose-Capillary: 166 mg/dL — ABNORMAL HIGH (ref 70–99)
Glucose-Capillary: 212 mg/dL — ABNORMAL HIGH (ref 70–99)
Glucose-Capillary: 188 mg/dL — ABNORMAL HIGH (ref 70–99)

## 2020-05-02 LAB — PROCALCITONIN: Procalcitonin: <0.1 ng/mL

## 2020-05-02 MED ORDER — PREDNISONE 20 MG PO TABS
20.0000 mg | ORAL_TABLET | Freq: Every day | ORAL | Status: DC
  Administered 2020-05-03: 20 mg via ORAL
  Filled 2020-05-02: qty 1

## 2020-05-02 MED ORDER — LEVOFLOXACIN 750 MG PO TABS
750.0000 mg | ORAL_TABLET | Freq: Every day | ORAL | Status: DC
  Administered 2020-05-02 – 2020-05-03 (×2): 750 mg via ORAL
  Filled 2020-05-02 (×2): qty 1

## 2020-05-02 NOTE — Plan of Care (Signed)

## 2020-05-02 NOTE — Progress Notes (Signed)
PROGRESS NOTE    [name]  OIN:[id number] DOB: 1961/02/05 DOA: 04/29/2020 PCP: [name], MD      Brief Narrative:  [name] is a 59 y.o. M with obesity BMI 42, DM, HTN, COVID-19 in Jan 2021, CAD s/p CABG in May 2021 by Dr. Orvan Seen, and possible COPD who presented with several days progressive cough, SOB.  In the ER, hypoxic to 88%, BNP slightly elevated, CXR showed left base opacity, WBC 17K.  Started on steroids, bronchodilators and Lasix and admitted for COPD flare and CHF flare.  ------------------------------------------------------------------------------------------------------------------  Subjective: The patient was seen and examined this morning.  Hemodynamically remained stable. Afebrile normotensive.  Marked leukocytosis 19.5 Still complaining of shortness of breath and cough especially with exertion, Addressed on room air satting 95%. No issues, overnight- still c/o lower extremity swelling, with some improvement from yesterday  No sputum, no chest pain, no confusion.  -----------------------------------------------------------------------------------------------------------------  Assessment & Plan:  Acute respiratory failure with hypoxia due to acute on chronic diastolic CHF Patient without significant CHF in past, but had MI with CABG last month.  Now with elevated BNP, orthopnea, possible edema on CXR.  Echocardiogram here shows normal EF, grade II DD.    Net negative 1880 ml/24 hrs.  Cr and K stable, edematous lower extremities   -will Continue Lasix 80 BID -Continue monitoring potassium 3.8 today  -Strict I/Os, daily weights, telemetry  -Daily monitoring renal function: BUN 31 creatinine 0.88     COPD exacerbation, likely Obesity hypoventilation syndrome, likely Patient remote smoker.  No real chronic bronchitis or dyspnea symptoms until 2018 when he moved in with his mother, after which noticed more frequent bronchitis episodes, maybe? chronic  cough.    Major change in his respiratory status was in Jan 2021, had COVID, admitted for 5 days on steroids, remdesivir, peak O2 requirement 2L.  Since then, he has had chronic dyspnea on exertion, feeling out of breath, fatigued.    PFTs reviewed (done during May admission for CABG), normal FEV1/FVC ratio, although both reduced.  ?OHS + COPD, needs Pulm referral after discharge.  Here, he was somewhat wheezy for prior providers, seems to feel better on steroids and bronchodilators  Persistent dry cough, shortness of breath with exertion/with leukocytosis, PNA -Continue bronchodilators -Continue azithromycin, DC'd 05/02/2020,  Due to marked leukocytosis likely due to steroids nevertheless antibiotics switched to p.o. Levaquin (empirically), obtaining a procalcitonin level -Continue prednisone day 4 of 5  -Has follow up with Pulmonology scheduled with Dr. Elsworth Soho on 05/24/2020 at 11:50am   Pneumonia, likely His CXR shows Left base opacity, WBC is up and primary symptomatology on presentation was cough, so I think coverage of CAP is appropriate.  Continues to have cough, persistent leukocytosis day 4 of azithromycin will be switched to p.o. Levaquin for better coverage  No new fever.  Leukocytosis exacerbated by steroids, obtaining procalcitonin level -Seems to be improving from baseline -Continue azithromycin day 4 of 5, switched to p.o. Levaquin 05/02/2020 for possible 3 more days empirically  Obesity BMI 42 -Continue pantoprazole  Hypertension Coronary disease secondary prevention, CABG in May BP controlled -Continue aspirin, atorvastatin, Plavix -Continue losartan, metoprolol  Diabetes with polyneuropathy Glucoses improved -Continue SS corrections -Continue home 70/30 insulin  -Continue gabapentin -Hold home metformin  Anemia, probably iron deficiency Near microcytic.  Hgb lower than 10 g/dL range post-CABG.  No clinical bleeding.  Ferritin normal but iron sat low. -Start  iron, repeat Hgb and iron studies in 3 months  Mood disorder -Continue  venlafaxine  BPH -Continue Flomax    Disposition: Status is: Inpatient  Remains inpatient appropriate because:this is a severe exacerbation of CHF requiring supplemental O2 and he is still obviously overloaded on exam   Dispo:  Patient From: Home  Planned Disposition: Home with Health Care Svc  Expected discharge date: 05/02/2020  medically stable for discharge: No   We will continue to keep the patient off oxygen, will ambulate and monitor O2 sat    MDM: The below labs and imaging reports reviewed and summarized above.  Medication management as above.    DVT prophylaxis: Lovenox  Code Status: FULL Family Communication:     Consultants:     Procedures:   6/21 Echo -- EF normal, Grade II DD  Antimicrobials:    Azithromycin 6/20 >> 05/02/2020  P.o. Levaquin 05/02/2020 >>   Culture data:        Objective: Vitals:   05/01/20 1922 05/01/20 2141 05/02/20 0531 05/02/20 0842  BP:  140/70 133/72   Pulse:  87 88   Resp:  20 20   Temp:  (!) 97.5 F (36.4 C) 98 F (36.7 C)   TempSrc:  Oral Oral   SpO2: 98% 97% 92% 95%  Weight:      Height:        Intake/Output Summary (Last 24 hours) at 05/02/2020 0950 Last data filed at 05/02/2020 0700 Gross per 24 hour  Intake 770 ml  Output 2650 ml  Net -1880 ml   Filed Weights   04/29/20 2153 04/30/20 0034 05/01/20 0450  Weight: 128.9 kg 128.9 kg 127 kg     Physical Exam  Constitution:  Alert, cooperative, no distress, shortness of breath with exertion Psychiatric: Normal and stable mood and affect, cognition intact,   HEENT: Normocephalic, PERRL, otherwise with in Normal limits  Chest:Chest symmetric Cardio vascular:  S1/S2, RRR, No murmure, No Rubs or Gallops  pulmonary: Cough shortness of breath with exertion, scattered mild rhonchi diffusely respirations unlabored at rest,, negative wheezes / crackles Abdomen: Soft, non-tender,  non-distended, bowel sounds,no masses, no organomegaly Muscular skeletal: Limited exam - in bed, able to move all 4 extremities, Normal strength,  Neuro: CNII-XII intact. , normal motor and sensation, reflexes intact  Extremities: No pitting edema lower extremities, +2 pulses  Skin: Dry, warm to touch, negative for any Rashes, No open wounds Wounds: per nursing documentation       Data Reviewed: I have personally reviewed following labs and imaging studies:  CBC: Recent Labs  Lab 04/29/20 1330 05/01/20 0615 05/02/20 0603  WBC 17.6* 19.7* 19.5*  NEUTROABS 12.4*  --   --   HGB 8.9* 9.2* 9.0*  HCT 28.9* 30.4* 29.7*  MCV 84.3 83.1 83.2  PLT 518* 567* 950*   Basic Metabolic Panel: Recent Labs  Lab 04/29/20 1330 04/30/20 0615 05/01/20 0615 05/02/20 0603  NA 136 136 136 137  K 3.9 4.6 3.9 3.8  CL 94* 96* 97* 95*  CO2 27 27 30 30   GLUCOSE 84 225* 95 84  BUN 21* 22* 27* 31*  CREATININE 1.00 0.82 0.82 0.88  CALCIUM 9.0 8.9 9.2 9.1  CBG: Recent Labs  Lab 05/01/20 0735 05/01/20 1100 05/01/20 1645 05/01/20 2142 05/02/20 0731  GLUCAP 83 172* 216* 246* 81   Lipid Profile: No results for input(s): CHOL, HDL, LDLCALC, TRIG, CHOLHDL, LDLDIRECT in the last 72 hours. Thyroid Function Tests: No results for input(s): TSH, T4TOTAL, FREET4, T3FREE, THYROIDAB in the last 72 hours. Anemia Panel: Recent Labs  05/01/20 0615  FERRITIN 158  TIBC 341  IRON 24*  RETICCTPCT 1.6   Urine analysis:    Component Value Date/Time   COLORURINE YELLOW 03/05/2020 1251   APPEARANCEUR CLEAR 03/05/2020 1251   LABSPEC 1.014 03/05/2020 1251   PHURINE 7.0 03/05/2020 1251   GLUCOSEU >=500 (A) 03/05/2020 1251   HGBUR NEGATIVE 03/05/2020 1251   BILIRUBINUR NEGATIVE 03/05/2020 1251   KETONESUR NEGATIVE 03/05/2020 1251   PROTEINUR 100 (A) 03/05/2020 1251   NITRITE NEGATIVE 03/05/2020 1251   LEUKOCYTESUR NEGATIVE 03/05/2020 1251   Sepsis  Labs: @LABRCNTIP (procalcitonin:4,lacticacidven:4)  ) Recent Results (from the past 240 hour(s))  SARS Coronavirus 2 by RT PCR (hospital order, performed in [facility] hospital lab) Nasopharyngeal Nasopharyngeal Swab     Status: None   Collection Time: 04/29/20  6:16 PM   Specimen: Nasopharyngeal Swab  Result Value Ref Range Status   SARS Coronavirus 2 NEGATIVE NEGATIVE Final    Comment: (NOTE) SARS-CoV-2 target nucleic acids are NOT DETECTED.  The SARS-CoV-2 RNA is generally detectable in upper and lower respiratory specimens during the acute phase of infection. The lowest concentration of SARS-CoV-2 viral copies this assay can detect is 250 copies / mL. A negative result does not preclude SARS-CoV-2 infection and should not be used as the sole basis for treatment or other patient management decisions.  A negative result may occur with improper specimen collection / handling, submission of specimen other than nasopharyngeal swab, presence of viral mutation(s) within the areas targeted by this assay, and inadequate number of viral copies (<250 copies / mL). A negative result must be combined with clinical observations, patient history, and epidemiological information.  Fact Sheet for Patients:   StrictlyIdeas.no  Fact Sheet for Healthcare Providers: BankingDealers.co.za  This test is not yet approved or  cleared by the [place] FDA and has been authorized for detection and/or diagnosis of SARS-CoV-2 by FDA under an Emergency Use Authorization (EUA).  This EUA will remain in effect (meaning this test can be used) for the duration of the COVID-19 declaration under Section 564(b)(1) of the Act, 21 U.S.C. section 360bbb-3(b)(1), unless the authorization is terminated or revoked sooner.  Performed at [facility], [address], [place], [place] [address]          Radiology Studies: ECHOCARDIOGRAM COMPLETE  Result Date:  04/30/2020    ECHOCARDIOGRAM REPORT   Patient Name:   Shane Chambers Date of Exam: 04/30/2020 Medical Rec #:  [id number]       Height:       69.0 in Accession #:    [id number]      Weight:       284.2 lb Date of Birth:  1961/01/17       BSA:          2.399 m Patient Age:    62 years        BP:           147/77 mmHg Patient Gender: M               HR:           87 bpm. Exam Location:  [name] Procedure: 2D Echo Indications:    CHF-Acute Systolic 563.87 / F64.33  History:        Patient has prior history of Echocardiogram examinations. CAD,                 Prior CABG; Risk Factors:Dyslipidemia, Former Smoker, Diabetes  and Hypertension. Pneumonia Due to Covid 19.  Sonographer:    [name] RDCS (AE) Referring Phys: [place]  1. Left ventricular ejection fraction, by estimation, is 60 to 65%. The left ventricle has normal function. The left ventricle has no regional wall motion abnormalities. There is moderate left ventricular hypertrophy. Left ventricular diastolic parameters are consistent with Grade II diastolic dysfunction (pseudonormalization). Elevated left atrial pressure.  2. Right ventricular systolic function is normal. The right ventricular size is normal.  3. The mitral valve is normal in structure. No evidence of mitral valve regurgitation. No evidence of mitral stenosis.  4. The aortic valve has an indeterminant number of cusps. Aortic valve regurgitation is not visualized. No aortic stenosis is present.  5. The inferior vena cava is normal in size with greater than 50% respiratory variability, suggesting right atrial pressure of 3 mmHg. FINDINGS  Left Ventricle: Left ventricular ejection fraction, by estimation, is 60 to 65%. The left ventricle has normal function. The left ventricle has no regional wall motion abnormalities. The left ventricular internal cavity size was normal in size. There is  moderate left ventricular hypertrophy. Left ventricular  diastolic parameters are consistent with Grade II diastolic dysfunction (pseudonormalization). Elevated left atrial pressure. Right Ventricle: The right ventricular size is normal. No increase in right ventricular wall thickness. Right ventricular systolic function is normal. Left Atrium: Left atrial size was normal in size. Right Atrium: Right atrial size was normal in size. Pericardium: There is no evidence of pericardial effusion. Mitral Valve: The mitral valve is normal in structure. No evidence of mitral valve regurgitation. No evidence of mitral valve stenosis. Tricuspid Valve: The tricuspid valve is normal in structure. Tricuspid valve regurgitation is not demonstrated. No evidence of tricuspid stenosis. Aortic Valve: The aortic valve has an indeterminant number of cusps. . There is mild thickening and mild calcification of the aortic valve. Aortic valve regurgitation is not visualized. No aortic stenosis is present. Mild aortic valve annular calcification. There is mild thickening of the aortic valve. There is mild calcification of the aortic valve. Aortic valve mean gradient measures 6.1 mmHg. Aortic valve peak gradient measures 10.9 mmHg. Aortic valve area, by VTI measures 2.15 cm. Pulmonic Valve: The pulmonic valve was not well visualized. Pulmonic valve regurgitation is not visualized. No evidence of pulmonic stenosis. Aorta: The aortic root is normal in size and structure. Venous: The inferior vena cava is normal in size with greater than 50% respiratory variability, suggesting right atrial pressure of 3 mmHg. IAS/Shunts: The interatrial septum was not well visualized.  LEFT VENTRICLE PLAX 2D LVIDd:         4.45 cm  Diastology LVIDs:         2.47 cm  LV e' lateral:   8.81 cm/s LV PW:         1.36 cm  LV E/e' lateral: 12.8 LV IVS:        1.29 cm  LV e' medial:    5.55 cm/s LVOT diam:     2.10 cm  LV E/e' medial:  20.4 LV SV:         74 LV SV Index:   31 LVOT Area:     3.46 cm  RIGHT VENTRICLE RV S  prime:     9.79 cm/s LEFT ATRIUM           Index LA diam:      4.30 cm 1.79 cm/m LA Vol (A4C): 46.6 ml 19.43 ml/m  AORTIC VALVE AV  Area (Vmax):    2.21 cm AV Area (Vmean):   2.10 cm AV Area (VTI):     2.15 cm AV Vmax:           165.06 cm/s AV Vmean:          116.016 cm/s AV VTI:            0.343 m AV Peak Grad:      10.9 mmHg AV Mean Grad:      6.1 mmHg LVOT Vmax:         105.26 cm/s LVOT Vmean:        70.183 cm/s LVOT VTI:          0.213 m LVOT/AV VTI ratio: 0.62  AORTA Ao Root diam: 3.00 cm MITRAL VALVE MV Area (PHT): 3.77 cm     SHUNTS MV Decel Time: 201 msec     Systemic VTI:  0.21 m MV E velocity: 113.00 cm/s  Systemic Diam: 2.10 cm MV A velocity: 59.70 cm/s MV E/A ratio:  1.89 Carlyle Dolly MD Electronically signed by Carlyle Dolly MD Signature Date/Time: 04/30/2020/2:26:58 PM    Final         Scheduled Meds: . aspirin EC  81 mg Oral Daily  . atorvastatin  80 mg Oral QHS  . clopidogrel  75 mg Oral Daily  . enoxaparin (LOVENOX) injection  60 mg Subcutaneous Q24H  . ferrous sulfate  325 mg Oral QODAY  . furosemide  80 mg Intravenous Q12H  . gabapentin  900 mg Oral BID  . insulin aspart  0-20 Units Subcutaneous TID WC  . insulin aspart  0-5 Units Subcutaneous QHS  . insulin aspart protamine- aspart  55 Units Subcutaneous BID WC  . ipratropium-albuterol  3 mL Nebulization TID  . levofloxacin  750 mg Oral Daily  . losartan  25 mg Oral Daily  . metoprolol tartrate  25 mg Oral BID  . pantoprazole  40 mg Oral Daily  . potassium chloride  40 mEq Oral BID  . predniSONE  40 mg Oral Q breakfast  . sodium chloride flush  3 mL Intravenous Q12H  . tamsulosin  0.4 mg Oral Daily  . venlafaxine XR  150 mg Oral Q breakfast   Continuous Infusions: . sodium chloride       LOS: 3 days    Time spent: 25  minutes    Deatra James, MD [facility] 05/02/2020, 9:50 AM     Please page though [place] or Epic secure chat:  For [employer], Adult nurse

## 2020-05-03 LAB — BASIC METABOLIC PANEL
Anion gap: 14 (ref 5–15)
BUN: 38 mg/dL — ABNORMAL HIGH (ref 6–20)
CO2: 27 mmol/L (ref 22–32)
Calcium: 9 mg/dL (ref 8.9–10.3)
Chloride: 92 mmol/L — ABNORMAL LOW (ref 98–111)
Creatinine, Ser: 1.2 mg/dL (ref 0.61–1.24)
GFR calc Af Amer: >60 mL/min (ref >60)
GFR calc non Af Amer: >60 mL/min (ref >60)
Glucose, Bld: 97 mg/dL (ref 70–99)
Potassium: 4 mmol/L (ref 3.5–5.1)
Sodium: 133 mmol/L — ABNORMAL LOW (ref 135–145)

## 2020-05-03 LAB — CBC
HCT: 30.3 % — ABNORMAL LOW (ref 39.0–52.0)
Hemoglobin: 9.1 g/dL — ABNORMAL LOW (ref 13.0–17.0)
MCH: 25.1 pg — ABNORMAL LOW (ref 26.0–34.0)
MCHC: 30 g/dL (ref 30.0–36.0)
MCV: 83.7 fL (ref 80.0–100.0)
Platelets: 555 10*3/uL — ABNORMAL HIGH (ref 150–400)
RBC: 3.62 MIL/uL — ABNORMAL LOW (ref 4.22–5.81)
RDW: 14.7 % (ref 11.5–15.5)
WBC: 18.8 10*3/uL — ABNORMAL HIGH (ref 4.0–10.5)
nRBC: 0 % (ref 0.0–0.2)

## 2020-05-03 LAB — GLUCOSE, CAPILLARY
Glucose-Capillary: 101 mg/dL — ABNORMAL HIGH (ref 70–99)
Glucose-Capillary: 197 mg/dL — ABNORMAL HIGH (ref 70–99)

## 2020-05-03 MED ORDER — ACETAMINOPHEN 325 MG PO TABS: 650.0000 mg | ORAL_TABLET | ORAL | 0 refills | Status: AC | PRN

## 2020-05-03 MED ORDER — INSULIN ASPART 100 UNIT/ML ~~LOC~~ SOLN: 0.0000 [IU] | Freq: Every day | SUBCUTANEOUS | 11 refills | Status: DC

## 2020-05-03 MED ORDER — FERROUS SULFATE 325 (65 FE) MG PO TABS: 325.0000 mg | ORAL_TABLET | ORAL | 3 refills | Status: DC

## 2020-05-03 MED ORDER — PREDNISONE 20 MG PO TABS: 10.0000 mg | ORAL_TABLET | Freq: Every day | ORAL | 0 refills | Status: AC

## 2020-05-03 MED ORDER — GUAIFENESIN-DM 100-10 MG/5ML PO SYRP: 5.0000 mL | ORAL_SOLUTION | ORAL | 0 refills | Status: DC | PRN

## 2020-05-03 MED ORDER — ALPRAZOLAM 0.5 MG PO TABS
0.5000 mg | ORAL_TABLET | Freq: Once | ORAL | Status: AC
  Administered 2020-05-03: 0.5 mg via ORAL
  Filled 2020-05-03: qty 1

## 2020-05-03 MED ORDER — FUROSEMIDE 20 MG PO TABS
40.0000 mg | ORAL_TABLET | Freq: Every day | ORAL | 11 refills | Status: DC
Start: 2020-05-03 — End: 2020-05-22

## 2020-05-03 MED ORDER — LOSARTAN POTASSIUM 25 MG PO TABS: 12.5000 mg | ORAL_TABLET | Freq: Every day | ORAL | Status: DC

## 2020-05-03 MED ORDER — LEVOFLOXACIN 750 MG PO TABS: 750.0000 mg | ORAL_TABLET | Freq: Every day | ORAL | 0 refills | Status: AC

## 2020-05-03 MED ORDER — INSULIN ASPART PROT & ASPART (70-30 MIX) 100 UNIT/ML ~~LOC~~ SUSP: 55.0000 [IU] | Freq: Two times a day (BID) | SUBCUTANEOUS | 11 refills | Status: DC

## 2020-05-03 MED ORDER — INSULIN ASPART 100 UNIT/ML ~~LOC~~ SOLN: 0.0000 [IU] | Freq: Three times a day (TID) | SUBCUTANEOUS | 11 refills | Status: DC

## 2020-05-03 NOTE — Progress Notes (Signed)
Nsg Discharge Note  Admit Date:  04/29/2020 Discharge date: 05/03/2020   Marny Lowenstein to be D/C'd Home per MD order.  AVS completed.  Copy for chart, and copy for patient signed, and dated. Removed IV-clean, dry, intact. Reviewed d/c paperwork with patient. Reviewed new meds and med changes. Wheeled stable patient and belongings to short stay entrance where he was picked up by his sister to d/c to home with [facility]. Patient/caregiver able to verbalize understanding.  Discharge Medication: Allergies as of 05/03/2020      Reactions   Ace Inhibitors Swelling, Cough   Other Itching   [name]   Penicillins    Has patient had a PCN reaction causing immediate rash, facial/tongue/throat swelling, SOB or lightheadedness with hypotension: Unknown Has patient had a PCN reaction causing severe rash involving mucus membranes or skin necrosis: Unknown Has patient had a PCN reaction that required hospitalization: Unknown Has patient had a PCN reaction occurring within the last 10 years: Unknown If all of the above answers are "NO", then may proceed with Cephalosporin use.      Medication List    STOP taking these medications   doxycycline 100 MG tablet Commonly known as: VIBRA-TABS   gabapentin 300 MG capsule Commonly known as: NEURONTIN   omeprazole 40 MG capsule Commonly known as: PRILOSEC   venlafaxine XR 150 MG 24 hr capsule Commonly known as: EFFEXOR-XR     TAKE these medications   acetaminophen 325 MG tablet Commonly known as: TYLENOL Take 2 tablets (650 mg total) by mouth every 4 (four) hours as needed for headache or mild pain.   albuterol 108 (90 Base) MCG/ACT inhaler Commonly known as: VENTOLIN HFA Inhale 1-2 puffs into the lungs every 6 (six) hours as needed for wheezing or shortness of breath.   alum & mag hydroxide-simeth 829-562-13 MG/5ML suspension Commonly known as: MAALOX PLUS Take 30 mLs by mouth as needed for indigestion.   aspirin 81 MG EC tablet Take 81 mg by  mouth daily.   atorvastatin 80 MG tablet Commonly known as: LIPITOR Take 1 tablet (80 mg total) by mouth at bedtime.   calcium carbonate 1250 (500 Ca) MG tablet Commonly known as: OS-CAL - dosed in mg of elemental calcium Take 1 tablet by mouth daily.   clopidogrel 75 MG tablet Commonly known as: PLAVIX Take 1 tablet (75 mg total) by mouth daily.   docusate sodium 100 MG capsule Commonly known as: COLACE Take 1 capsule (100 mg total) by mouth 2 (two) times daily.   ferrous sulfate 325 (65 FE) MG tablet Take 1 tablet (325 mg total) by mouth every other day.   fluticasone 27.5 MCG/SPRAY nasal spray Commonly known as: VERAMYST Place 2 sprays into the nose daily as needed for rhinitis or allergies.   furosemide 20 MG tablet Commonly known as: Lasix Take 2 tablets (40 mg total) by mouth daily. Take an extra 20mg  by mouth if noted to have greater than 3 lb weight gain in 24 hours. What changed: how much to take   guaiFENesin-dextromethorphan 100-10 MG/5ML syrup Commonly known as: ROBITUSSIN DM Take 5 mLs by mouth every 4 (four) hours as needed for cough.   insulin aspart 100 UNIT/ML injection Commonly known as: novoLOG Inject 0-20 Units into the skin 3 (three) times daily with meals.   insulin aspart 100 UNIT/ML injection Commonly known as: novoLOG Inject 0-5 Units into the skin at bedtime.   insulin aspart protamine- aspart (70-30) 100 UNIT/ML injection Commonly known as: NovoLOG  Mix 70/30 Inject 0.55 mLs (55 Units total) into the skin 2 (two) times daily with a meal.   levofloxacin 750 MG tablet Commonly known as: LEVAQUIN Take 1 tablet (750 mg total) by mouth daily for 5 days.   losartan 25 MG tablet Commonly known as: COZAAR Take 0.5 tablets (12.5 mg total) by mouth daily. What changed: how much to take   MAGNESIUM PO Take 400 mg by mouth daily.   metFORMIN 1000 MG tablet Commonly known as: GLUCOPHAGE Take 1,000 mg by mouth 2 (two) times daily with a meal.    metoprolol tartrate 25 MG tablet Commonly known as: LOPRESSOR Take 1 tablet (25 mg total) by mouth 2 (two) times daily.   polyethylene glycol 17 g packet Commonly known as: MIRALAX / GLYCOLAX Take 17 g by mouth daily as needed for mild constipation.   potassium chloride 10 MEQ tablet Commonly known as: KLOR-CON Take 1 tablet (10 mEq total) by mouth daily.   predniSONE 20 MG tablet Commonly known as: DELTASONE Take 0.5 tablets (10 mg total) by mouth daily with breakfast for 3 days.   Tab-A-Vite Tabs Take 1 tablet by mouth daily.   tamsulosin 0.4 MG Caps capsule Commonly known as: FLOMAX Take 0.4 mg by mouth daily.   traMADol 50 MG tablet Commonly known as: ULTRAM Take 1-2 tablets (50-100 mg total) by mouth every 4 (four) hours as needed for moderate pain.            Discharge Care Instructions  (From admission, onward)         Start     Ordered   05/03/20 0000  Discharge wound care:       Comments: Per wound care team. Please consult wound care team.   05/03/20 0646          Discharge Assessment: Vitals:   05/03/20 0455 05/03/20 0745  BP: (!) 143/81   Pulse: 82   Resp: 20   Temp: 97.8 F (36.6 C)   SpO2: 96% 94%   Skin clean, dry and intact without evidence of skin break down, no evidence of skin tears noted. IV catheter discontinued intact. Site without signs and symptoms of complications - no redness or edema noted at insertion site, patient denies c/o pain - only slight tenderness at site.  Dressing with slight pressure applied.  D/c Instructions-Education: Discharge instructions given to patient/family with verbalized understanding. D/c education completed with patient/family including follow up instructions, medication list, d/c activities limitations if indicated, with other d/c instructions as indicated by MD - patient able to verbalize understanding, all questions fully answered. Patient instructed to return to ED, call 911, or call MD for any  changes in condition.  Patient escorted via [place], and D/C home via private auto.  [name], RN 05/03/2020 1:30 PM

## 2020-05-03 NOTE — Discharge Summary (Signed)
Physician Discharge Summary [facility]        Patient: Shane Chambers                   Admit date: 04/29/2020   DOB: September 09, 1961             Discharge date:05/03/2020/9:04 AM [id number]                          PCP: [name], MD  Disposition:      Recommendations for Outpatient Follow-up:   . Follow up: Dr. Elsworth Soho on 05/24/2020 at 11:50am in 1 week  .   Discharge Condition: Stable   Code Status:   Code Status: Full Code  Diet recommendation: Cardiac diet   Discharge Diagnoses:    Principal Problem:   Acute respiratory failure with hypoxia ([place]) Active Problems:   Hypertension   Type 2 diabetes mellitus (HCC)   Acute on chronic combined systolic and diastolic CHF (congestive heart failure) (HCC)   Ischemic cardiomyopathy   Coronary artery disease   S/P CABG x 5   Acute exacerbation of chronic obstructive pulmonary disease (COPD) ([place])   Leukocytosis  Brief Narrative:  Shane Chambers is a 59 y.o. M with obesity BMI 42, DM, HTN, COVID-19 in Jan 2021, CAD s/p CABG in May 2021 by Dr. Orvan Seen, and possible COPD who presented with several days progressive cough, SOB.  In the ER, hypoxic to 88%, BNP slightly elevated, CXR showed left base opacity, WBC 17K.  Started on steroids, bronchodilators and Lasix and admitted for COPD flare and CHF flare.  D/C summery / A&P:  Acute respiratory failure with hypoxia due to acute on chronic diastolic CHF Patient without significant CHF in past, but had MI with CABG last month.  Now with elevated BNP, orthopnea, possible edema on CXR.  Echocardiogram here shows normal EF, grade II DD.    Net negative 2100 ml/24 hrs.  Cr and K stable, edematous lower extremities   -will Continue Lasix 80 BID-- changed to PO lasix today  -Continue monitoring potassium  -Continue to monitor daily weights,  -We will monitor kidney function closely BUN/creatinine remained stable BUN 38 creatinine 1.2 today     COPD exacerbation,  likely Obesity hypoventilation syndrome, likely Patient remote smoker.  No real chronic bronchitis or dyspnea symptoms until 2018 when he moved in with his mother, after which noticed more frequent bronchitis episodes, maybe? chronic cough.    Major change in his respiratory status was in Jan 2021, had COVID, admitted for 5 days on steroids, remdesivir, peak O2 requirement 2L.  Since then, he has had chronic dyspnea on exertion, feeling out of breath, fatigued.    PFTs reviewed (done during May admission for CABG), normal FEV1/FVC ratio, although both reduced.  ?OHS + COPD, needs Pulm referral after discharge.  Here, he was somewhat wheezy for prior providers, seems to feel better on steroids and bronchodilators  Persistent dry cough, shortness of breath with exertion/with leukocytosis, PNA -Continue bronchodilators -Continue azithromycin, DC'd 05/02/2020,  Due to marked leukocytosis likely due to steroids nevertheless antibiotics switched to p.o. Levaquin (empirically), obtaining a procalcitonin level -Continue prednisone day 5 of 5--> to be tapered down over next 3 days  -Has follow up with Pulmonology scheduled with Dr. Elsworth Soho on 05/24/2020 at 11:50am   Pneumonia, likely His CXR shows Left base opacity, WBC is up and primary symptomatology on presentation was cough, so I think coverage of CAP  is appropriate.  Continues to have cough, persistent leukocytosis day 4 of azithromycin will be switched to p.o. Levaquin for better coverage  No new fever.  Leukocytosis exacerbated by steroids, obtaining procalcitonin level -Seems to be improving from baseline -Continue azithromycin day 4 of 5, switched to p.o. Levaquin 05/02/2020 for possible 3 more days empirically  Obesity BMI 42 -Continue pantoprazole  Hypertension Coronary disease secondary prevention, CABG in May BP controlled -Continue aspirin, atorvastatin, Plavix -Continue losartan, metoprolol  Diabetes with polyneuropathy Glucoses  improved -Continue SS corrections -Continue home 70/30 insulin  -Continue gabapentin -contin.  metformin  Anemia, probably iron deficiency Near microcytic.  Hgb lower than 10 g/dL range post-CABG.  No clinical bleeding.  Ferritin normal but iron sat low. -Start iron, repeat Hgb and iron studies in 3 months  Mood disorder -Continue venlafaxine  BPH -Continue Flomax    Disposition: Status is: Inpatient  Dispo:  Patient From: Home  Planned Disposition: Home with Health Care Svc  Expected discharge date: 05/03/2020  medically stable for discharge: No   MDM: The below labs and imaging reports reviewed and summarized above.  Medication management as above.    Code Status: FULL Family Communication: none   Consultants:   None   Procedures:   6/21 Echo -- EF normal, Grade II DD  Antimicrobials:    Azithromycin 6/20 >> 05/02/2020  P.o. Levaquin 05/02/2020 >>   Culture data:    none    Nutritional status:  Nutrition Problem: Food and nutrition related knowledge deficit Etiology: acute illness, chronic illness Signs/Symptoms: per patient/family report (diet recall)     Discharge Instructions:   Discharge Instructions    ([place]) Call MD:  Anytime you have any of the following symptoms: 1) 3 pound weight gain in 24 hours or 5 pounds in 1 week 2) shortness of breath, with or without a dry hacking cough 3) swelling in the hands, feet or stomach 4) if you have to sleep on extra pillows at night in order to breathe.   Complete by: As directed    Activity as tolerated - No restrictions   Complete by: As directed    Call MD for:  temperature >100.4   Complete by: As directed    Diet - low sodium heart healthy   Complete by: As directed    Discharge instructions   Complete by: As directed    Daily weight ----   Discharge wound care:   Complete by: As directed    Per wound care team. Please consult wound care team.   Increase activity slowly    Complete by: As directed    Remove dressing in 24 hours   Complete by: As directed    Remove dressing in 24 hours   Complete by: As directed        Medication List    STOP taking these medications   doxycycline 100 MG tablet Commonly known as: VIBRA-TABS   gabapentin 300 MG capsule Commonly known as: NEURONTIN   omeprazole 40 MG capsule Commonly known as: PRILOSEC   venlafaxine XR 150 MG 24 hr capsule Commonly known as: EFFEXOR-XR     TAKE these medications   acetaminophen 325 MG tablet Commonly known as: TYLENOL Take 2 tablets (650 mg total) by mouth every 4 (four) hours as needed for headache or mild pain.   albuterol 108 (90 Base) MCG/ACT inhaler Commonly known as: VENTOLIN HFA Inhale 1-2 puffs into the lungs every 6 (six) hours as needed for wheezing or  shortness of breath.   alum & mag hydroxide-simeth 704-888-91 MG/5ML suspension Commonly known as: MAALOX PLUS Take 30 mLs by mouth as needed for indigestion.   aspirin 81 MG EC tablet Take 81 mg by mouth daily.   atorvastatin 80 MG tablet Commonly known as: LIPITOR Take 1 tablet (80 mg total) by mouth at bedtime.   calcium carbonate 1250 (500 Ca) MG tablet Commonly known as: OS-CAL - dosed in mg of elemental calcium Take 1 tablet by mouth daily.   clopidogrel 75 MG tablet Commonly known as: PLAVIX Take 1 tablet (75 mg total) by mouth daily.   docusate sodium 100 MG capsule Commonly known as: COLACE Take 1 capsule (100 mg total) by mouth 2 (two) times daily.   ferrous sulfate 325 (65 FE) MG tablet Take 1 tablet (325 mg total) by mouth every other day.   fluticasone 27.5 MCG/SPRAY nasal spray Commonly known as: VERAMYST Place 2 sprays into the nose daily as needed for rhinitis or allergies.   furosemide 20 MG tablet Commonly known as: Lasix Take 2 tablets (40 mg total) by mouth daily. Take an extra 20mg  by mouth if noted to have greater than 3 lb weight gain in 24 hours. What changed: how much to  take   guaiFENesin-dextromethorphan 100-10 MG/5ML syrup Commonly known as: ROBITUSSIN DM Take 5 mLs by mouth every 4 (four) hours as needed for cough.   insulin aspart 100 UNIT/ML injection Commonly known as: novoLOG Inject 0-20 Units into the skin 3 (three) times daily with meals.   insulin aspart 100 UNIT/ML injection Commonly known as: novoLOG Inject 0-5 Units into the skin at bedtime.   insulin aspart protamine- aspart (70-30) 100 UNIT/ML injection Commonly known as: NovoLOG Mix 70/30 Inject 0.55 mLs (55 Units total) into the skin 2 (two) times daily with a meal.   levofloxacin 750 MG tablet Commonly known as: LEVAQUIN Take 1 tablet (750 mg total) by mouth daily for 5 days.   losartan 25 MG tablet Commonly known as: COZAAR Take 0.5 tablets (12.5 mg total) by mouth daily. What changed: how much to take   MAGNESIUM PO Take 400 mg by mouth daily.   metFORMIN 1000 MG tablet Commonly known as: GLUCOPHAGE Take 1,000 mg by mouth 2 (two) times daily with a meal.   metoprolol tartrate 25 MG tablet Commonly known as: LOPRESSOR Take 1 tablet (25 mg total) by mouth 2 (two) times daily.   polyethylene glycol 17 g packet Commonly known as: MIRALAX / GLYCOLAX Take 17 g by mouth daily as needed for mild constipation.   potassium chloride 10 MEQ tablet Commonly known as: KLOR-CON Take 1 tablet (10 mEq total) by mouth daily.   predniSONE 20 MG tablet Commonly known as: DELTASONE Take 0.5 tablets (10 mg total) by mouth daily with breakfast for 3 days.   Tab-A-Vite Tabs Take 1 tablet by mouth daily.   tamsulosin 0.4 MG Caps capsule Commonly known as: FLOMAX Take 0.4 mg by mouth daily.   traMADol 50 MG tablet Commonly known as: ULTRAM Take 1-2 tablets (50-100 mg total) by mouth every 4 (four) hours as needed for moderate pain.       Follow-up Information    [facility] Follow up.   Specialty: Home Health Services Why:   PT Contact information: [address] [place] [address] [phone]        [facility]. Schedule an appointment as soon as possible for a visit in 1 month(s).  Specialty: Pulmonology Contact information: [place] [address] [phone]             Allergies  Allergen Reactions  . Ace Inhibitors Swelling and Cough  . Other Itching    [name]  . Penicillins     Has patient had a PCN reaction causing immediate rash, facial/tongue/throat swelling, SOB or lightheadedness with hypotension: Unknown Has patient had a PCN reaction causing severe rash involving mucus membranes or skin necrosis: Unknown Has patient had a PCN reaction that required hospitalization: Unknown Has patient had a PCN reaction occurring within the last 10 years: Unknown If all of the above answers are "NO", then may proceed with Cephalosporin use.      Procedures /Studies:   DG Chest 2 View  Result Date: 04/16/2020 CLINICAL DATA:  Status post CABG EXAM: CHEST - 2 VIEW COMPARISON:  03/26/2020 FINDINGS: Previous median sternotomy and CABG procedure. Stable mild cardiac enlargement. Low lung volumes with asymmetric elevation of the right hemidiaphragm appears unchanged. No pleural effusion or edema. No airspace densities. IMPRESSION: 1. Low lung volumes. 2. No acute cardiopulmonary abnormalities. Electronically Signed   By: Kerby Moors M.D.   On: 04/16/2020 10:54   DG Chest Port 1 View  Result Date: 04/29/2020 CLINICAL DATA:  Cough, dyspnea and back pain 4 days. EXAM: PORTABLE CHEST 1 VIEW COMPARISON:  04/16/2020 FINDINGS: Lungs are hypoinflated with opacification over the left base/retrocardiac region likely small effusion with associated atelectasis although infection in the left base is possible. Stable cardiomegaly. Remainder of the exam is unchanged. IMPRESSION: 1. Left base opacification likely small effusion with associated atelectasis, although infection is possible. 2.   Stable cardiomegaly. Electronically Signed   By: Marin Olp M.D.   On: 04/29/2020 15:26   ECHOCARDIOGRAM COMPLETE  Result Date: 04/30/2020    ECHOCARDIOGRAM REPORT   Patient Name:   Shane Chambers Date of Exam: 04/30/2020 Medical Rec #:  [id number]       Height:       69.0 in Accession #:    [id number]      Weight:       284.2 lb Date of Birth:  10/25/1961       BSA:          2.399 m Patient Age:    84 years        BP:           147/77 mmHg Patient Gender: M               HR:           87 bpm. Exam Location:  [name] Procedure: 2D Echo Indications:    CHF-Acute Systolic 725.36 / U44.03  History:        Patient has prior history of Echocardiogram examinations. CAD,                 Prior CABG; Risk Factors:Dyslipidemia, Former Smoker, Diabetes                 and Hypertension. Pneumonia Due to Covid 19.  Sonographer:    [name] RDCS (AE) Referring Phys: [place]  1. Left ventricular ejection fraction, by estimation, is 60 to 65%. The left ventricle has normal function. The left ventricle has no regional wall motion abnormalities. There is moderate left ventricular hypertrophy. Left ventricular diastolic parameters are consistent with Grade II diastolic dysfunction (pseudonormalization). Elevated left atrial pressure.  2. Right ventricular systolic function  is normal. The right ventricular size is normal.  3. The mitral valve is normal in structure. No evidence of mitral valve regurgitation. No evidence of mitral stenosis.  4. The aortic valve has an indeterminant number of cusps. Aortic valve regurgitation is not visualized. No aortic stenosis is present.  5. The inferior vena cava is normal in size with greater than 50% respiratory variability, suggesting right atrial pressure of 3 mmHg. FINDINGS  Left Ventricle: Left ventricular ejection fraction, by estimation, is 60 to 65%. The left ventricle has normal function. The left ventricle has no regional wall motion abnormalities.  The left ventricular internal cavity size was normal in size. There is  moderate left ventricular hypertrophy. Left ventricular diastolic parameters are consistent with Grade II diastolic dysfunction (pseudonormalization). Elevated left atrial pressure. Right Ventricle: The right ventricular size is normal. No increase in right ventricular wall thickness. Right ventricular systolic function is normal. Left Atrium: Left atrial size was normal in size. Right Atrium: Right atrial size was normal in size. Pericardium: There is no evidence of pericardial effusion. Mitral Valve: The mitral valve is normal in structure. No evidence of mitral valve regurgitation. No evidence of mitral valve stenosis. Tricuspid Valve: The tricuspid valve is normal in structure. Tricuspid valve regurgitation is not demonstrated. No evidence of tricuspid stenosis. Aortic Valve: The aortic valve has an indeterminant number of cusps. . There is mild thickening and mild calcification of the aortic valve. Aortic valve regurgitation is not visualized. No aortic stenosis is present. Mild aortic valve annular calcification. There is mild thickening of the aortic valve. There is mild calcification of the aortic valve. Aortic valve mean gradient measures 6.1 mmHg. Aortic valve peak gradient measures 10.9 mmHg. Aortic valve area, by VTI measures 2.15 cm. Pulmonic Valve: The pulmonic valve was not well visualized. Pulmonic valve regurgitation is not visualized. No evidence of pulmonic stenosis. Aorta: The aortic root is normal in size and structure. Venous: The inferior vena cava is normal in size with greater than 50% respiratory variability, suggesting right atrial pressure of 3 mmHg. IAS/Shunts: The interatrial septum was not well visualized.  LEFT VENTRICLE PLAX 2D LVIDd:         4.45 cm  Diastology LVIDs:         2.47 cm  LV e' lateral:   8.81 cm/s LV PW:         1.36 cm  LV E/e' lateral: 12.8 LV IVS:        1.29 cm  LV e' medial:    5.55 cm/s LVOT  diam:     2.10 cm  LV E/e' medial:  20.4 LV SV:         74 LV SV Index:   31 LVOT Area:     3.46 cm  RIGHT VENTRICLE RV S prime:     9.79 cm/s LEFT ATRIUM           Index LA diam:      4.30 cm 1.79 cm/m LA Vol (A4C): 46.6 ml 19.43 ml/m  AORTIC VALVE AV Area (Vmax):    2.21 cm AV Area (Vmean):   2.10 cm AV Area (VTI):     2.15 cm AV Vmax:           165.06 cm/s AV Vmean:          116.016 cm/s AV VTI:            0.343 m AV Peak Grad:      10.9 mmHg AV Mean Grad:  6.1 mmHg LVOT Vmax:         105.26 cm/s LVOT Vmean:        70.183 cm/s LVOT VTI:          0.213 m LVOT/AV VTI ratio: 0.62  AORTA Ao Root diam: 3.00 cm MITRAL VALVE MV Area (PHT): 3.77 cm     SHUNTS MV Decel Time: 201 msec     Systemic VTI:  0.21 m MV E velocity: 113.00 cm/s  Systemic Diam: 2.10 cm MV A velocity: 59.70 cm/s MV E/A ratio:  1.89 Carlyle Dolly MD Electronically signed by Carlyle Dolly MD Signature Date/Time: 04/30/2020/2:26:58 PM    Final     Subjective:   Patient was seen and examined 05/03/2020, 9:04 AM Patient stable today. No acute distress.  No issues overnight Stable for discharge.  Discharge Exam:    Vitals:   05/02/20 2142 05/03/20 0455 05/03/20 0500 05/03/20 0745  BP: 113/69 (!) 143/81    Pulse: 88 82    Resp: 20 20    Temp: 98 F (36.7 C) 97.8 F (36.6 C)    TempSrc: Oral Oral    SpO2: 95% 96%  94%  Weight:   125.4 kg   Height:        General: Pt lying comfortably in bed & appears in no obvious distress. Cardiovascular: S1 & S2 heard, RRR, S1/S2 +. No murmurs, rubs, gallops or clicks. No JVD or pedal edema. Respiratory: Clear to auscultation without wheezing, rhonchi or crackles. No increased work of breathing. Abdominal:  Non-distended, non-tender & soft. No organomegaly or masses appreciated. Normal bowel sounds heard. CNS: Alert and oriented. No focal deficits. Extremities: no edema, no cyanosis    The results of significant diagnostics from this hospitalization (including imaging,  microbiology, ancillary and laboratory) are listed below for reference.      Microbiology:   Recent Results (from the past 240 hour(s))  SARS Coronavirus 2 by RT PCR (hospital order, performed in [facility] hospital lab) Nasopharyngeal Nasopharyngeal Swab     Status: None   Collection Time: 04/29/20  6:16 PM   Specimen: Nasopharyngeal Swab  Result Value Ref Range Status   SARS Coronavirus 2 NEGATIVE NEGATIVE Final    Comment: (NOTE) SARS-CoV-2 target nucleic acids are NOT DETECTED.  The SARS-CoV-2 RNA is generally detectable in upper and lower respiratory specimens during the acute phase of infection. The lowest concentration of SARS-CoV-2 viral copies this assay can detect is 250 copies / mL. A negative result does not preclude SARS-CoV-2 infection and should not be used as the sole basis for treatment or other patient management decisions.  A negative result may occur with improper specimen collection / handling, submission of specimen other than nasopharyngeal swab, presence of viral mutation(s) within the areas targeted by this assay, and inadequate number of viral copies (<250 copies / mL). A negative result must be combined with clinical observations, patient history, and epidemiological information.  Fact Sheet for Patients:   StrictlyIdeas.no  Fact Sheet for Healthcare Providers: BankingDealers.co.za  This test is not yet approved or  cleared by the [place] FDA and has been authorized for detection and/or diagnosis of SARS-CoV-2 by FDA under an Emergency Use Authorization (EUA).  This EUA will remain in effect (meaning this test can be used) for the duration of the COVID-19 declaration under Section 564(b)(1) of the Act, 21 U.S.C. section 360bbb-3(b)(1), unless the authorization is terminated or revoked sooner.  Performed at [facility], [address] [place], [place] [address]  Labs:   CBC: Recent Labs   Lab 04/29/20 1330 05/01/20 0615 05/02/20 0603 05/03/20 0453  WBC 17.6* 19.7* 19.5* 18.8*  NEUTROABS 12.4*  --   --   --   HGB 8.9* 9.2* 9.0* 9.1*  HCT 28.9* 30.4* 29.7* 30.3*  MCV 84.3 83.1 83.2 83.7  PLT 518* 567* 540* 154*   Basic Metabolic Panel: Recent Labs  Lab 04/29/20 1330 04/30/20 0615 05/01/20 0615 05/02/20 0603 05/03/20 0453  NA 136 136 136 137 133*  K 3.9 4.6 3.9 3.8 4.0  CL 94* 96* 97* 95* 92*  CO2 27 27 30 30 27   GLUCOSE 84 225* 95 84 97  BUN 21* 22* 27* 31* 38*  CREATININE 1.00 0.82 0.82 0.88 1.20  CALCIUM 9.0 8.9 9.2 9.1 9.0   Liver Function Tests: No results for input(s): AST, ALT, ALKPHOS, BILITOT, PROT, ALBUMIN in the last 168 hours. BNP (last 3 results) Recent Labs    12/20/19 1230 03/05/20 1330 04/29/20 1330  BNP 264.0* 463.0* 210.0*   Cardiac Enzymes: No results for input(s): CKTOTAL, CKMB, CKMBINDEX, TROPONINI in the last 168 hours. CBG: Recent Labs  Lab 05/02/20 0731 05/02/20 1131 05/02/20 1658 05/02/20 2144 05/03/20 0741  GLUCAP 81 166* 212* 188* 101*   Hgb A1c No results for input(s): HGBA1C in the last 72 hours. Lipid Profile No results for input(s): CHOL, HDL, LDLCALC, TRIG, CHOLHDL, LDLDIRECT in the last 72 hours. Thyroid function studies No results for input(s): TSH, T4TOTAL, T3FREE, THYROIDAB in the last 72 hours.  Invalid input(s): FREET3 Anemia work up Recent Labs    05/01/20 0615  FERRITIN 158  TIBC 341  IRON 24*  RETICCTPCT 1.6   Urinalysis    Component Value Date/Time   COLORURINE YELLOW 03/05/2020 1251   APPEARANCEUR CLEAR 03/05/2020 1251   LABSPEC 1.014 03/05/2020 1251   PHURINE 7.0 03/05/2020 1251   GLUCOSEU >=500 (A) 03/05/2020 1251   HGBUR NEGATIVE 03/05/2020 1251   BILIRUBINUR NEGATIVE 03/05/2020 1251   KETONESUR NEGATIVE 03/05/2020 1251   PROTEINUR 100 (A) 03/05/2020 1251   NITRITE NEGATIVE 03/05/2020 1251   LEUKOCYTESUR NEGATIVE 03/05/2020 1251         Time coordinating discharge: Over  45 minutes  SIGNED: Deatra James, MD, FACP, FHM. [facility],  Please use [email or web address] to Page If 7PM-7AM, please contact night-coverage [email or web address] [email or web address] [facility] 05/03/2020, 9:04 AM

## 2020-05-03 NOTE — TOC Transition Note (Signed)
Transition of Care [facility]) - CM/SW Discharge Note  Patient Details  Name: Shane Chambers MRN: [id number] Date of Birth: [birth year]  [facility] CM/SW Contact:  Sherie Don, LCSW Phone Number: 05/03/2020, 9:41 AM  Clinical Narrative: Patient is ready for discharge. CSW called Tim with [facility] to update him that patient is discharging today. CSW spoke with patient and informed him [facility] will follow [facility] with [facility] for [facility]. TOC signing off.  Final next level of care: [place] Barriers to Discharge: Barriers Resolved  Patient Goals and CMS Choice Patient states their goals for this hospitalization and ongoing recovery are:: Discharge home with [facility] Choice offered to / list presented to : Patient       DME Arranged: N/A DME Agency: NA HH Arranged: PT, OT [place] Agency: [facility] (formerly [employer]) Date [place]: 05/03/20 Time [place]: [phone] Representative spoke with at [place]: [place]  Readmission Risk Interventions Readmission Risk Prevention Plan 03/07/2020 12/06/2019  Transportation Screening Complete Complete  PCP or Specialist Appt within 3-5 Days - Not Complete  HRI or [place] - Not Complete  Social Work Consult for [place] Planning/Counseling - Not Complete  Palliative Care Screening - Not Complete  Medication Review Press photographer) Complete Complete  Some recent data might be hidden

## 2020-05-03 NOTE — Plan of Care (Signed)
  Problem: Education: Goal: Knowledge of General Education information will improve Description: Including pain rating scale, medication(s)/side effects and non-pharmacologic comfort measures Outcome: Adequate for Discharge   Problem: Health Behavior/Discharge Planning: Goal: Ability to manage health-related needs will improve Outcome: Adequate for Discharge   Problem: Clinical Measurements: Goal: Ability to maintain clinical measurements within normal limits will improve Outcome: Adequate for Discharge Goal: Will remain free from infection Outcome: Adequate for Discharge Goal: Diagnostic test results will improve Outcome: Adequate for Discharge Goal: Respiratory complications will improve Outcome: Adequate for Discharge Goal: Cardiovascular complication will be avoided Outcome: Adequate for Discharge   Problem: Activity: Goal: Risk for activity intolerance will decrease Outcome: Adequate for Discharge   Problem: Nutrition: Goal: Adequate nutrition will be maintained Outcome: Adequate for Discharge   Problem: Coping: Goal: Level of anxiety will decrease Outcome: Adequate for Discharge   Problem: Elimination: Goal: Will not experience complications related to bowel motility Outcome: Adequate for Discharge Goal: Will not experience complications related to urinary retention Outcome: Adequate for Discharge   Problem: Pain Managment: Goal: General experience of comfort will improve Outcome: Adequate for Discharge   Problem: Safety: Goal: Ability to remain free from injury will improve Outcome: Adequate for Discharge   Problem: Skin Integrity: Goal: Risk for impaired skin integrity will decrease Outcome: Adequate for Discharge   Problem: Education: Goal: Ability to demonstrate management of disease process will improve Outcome: Adequate for Discharge Goal: Ability to verbalize understanding of medication therapies will improve Outcome: Adequate for Discharge Goal:  Individualized Educational Video(s) Outcome: Adequate for Discharge   Problem: Activity: Goal: Capacity to carry out activities will improve Outcome: Adequate for Discharge   Problem: Cardiac: Goal: Ability to achieve and maintain adequate cardiopulmonary perfusion will improve Outcome: Adequate for Discharge   Problem: Education: Goal: Ability to demonstrate management of disease process will improve Outcome: Adequate for Discharge Goal: Ability to verbalize understanding of medication therapies will improve Outcome: Adequate for Discharge Goal: Individualized Educational Video(s) Outcome: Adequate for Discharge   Problem: Activity: Goal: Capacity to carry out activities will improve Outcome: Adequate for Discharge   Problem: Cardiac: Goal: Ability to achieve and maintain adequate cardiopulmonary perfusion will improve Outcome: Adequate for Discharge   

## 2020-05-03 NOTE — Progress Notes (Signed)
Pt requested medication to help with coughing. PRN given, (see mar). MD notified pt is restless and requested something to help him sleep.

## 2020-05-18 ENCOUNTER — Other Ambulatory Visit: Payer: Self-pay

## 2020-05-18 ENCOUNTER — Encounter: Payer: Self-pay | Admitting: Student

## 2020-05-18 ENCOUNTER — Other Ambulatory Visit (HOSPITAL_COMMUNITY)
Admission: RE | Admit: 2020-05-18 | Discharge: 2020-05-18 | Disposition: A | Discharge status: Home / Self Care | Payer: Medicaid Other | Source: Ambulatory Visit | Attending: Student | Admitting: Student

## 2020-05-18 ENCOUNTER — Ambulatory Visit (INDEPENDENT_AMBULATORY_CARE_PROVIDER_SITE_OTHER): Payer: Medicaid Other | Admitting: Student

## 2020-05-18 ENCOUNTER — Ambulatory Visit (HOSPITAL_COMMUNITY)
Admission: RE | Admit: 2020-05-18 | Discharge: 2020-05-18 | Disposition: A | Discharge status: Home / Self Care | Payer: Medicaid Other | Source: Ambulatory Visit | Attending: Student | Admitting: Student

## 2020-05-18 VITALS — BP 134/70 | HR 99 | Ht 69.0 in | Wt 281.6 lb

## 2020-05-18 DIAGNOSIS — Z79899 Other long term (current) drug therapy: Secondary | ICD-10-CM

## 2020-05-18 DIAGNOSIS — I25810 Atherosclerosis of coronary artery bypass graft(s) without angina pectoris: Secondary | ICD-10-CM | POA: Diagnosis not present

## 2020-05-18 DIAGNOSIS — Z951 Presence of aortocoronary bypass graft: Secondary | ICD-10-CM | POA: Insufficient documentation

## 2020-05-18 DIAGNOSIS — I255 Ischemic cardiomyopathy: Secondary | ICD-10-CM

## 2020-05-18 DIAGNOSIS — I10 Essential (primary) hypertension: Secondary | ICD-10-CM

## 2020-05-18 DIAGNOSIS — E785 Hyperlipidemia, unspecified: Secondary | ICD-10-CM

## 2020-05-18 LAB — BASIC METABOLIC PANEL
Anion gap: 13 (ref 5–15)
BUN: 23 mg/dL — ABNORMAL HIGH (ref 6–20)
CO2: 33 mmol/L — ABNORMAL HIGH (ref 22–32)
Calcium: 9.2 mg/dL (ref 8.9–10.3)
Chloride: 92 mmol/L — ABNORMAL LOW (ref 98–111)
Creatinine, Ser: 0.83 mg/dL (ref 0.61–1.24)
GFR calc Af Amer: >60 mL/min (ref >60)
GFR calc non Af Amer: >60 mL/min (ref >60)
Glucose, Bld: 209 mg/dL — ABNORMAL HIGH (ref 70–99)
Potassium: 3.8 mmol/L (ref 3.5–5.1)
Sodium: 138 mmol/L (ref 135–145)

## 2020-05-18 LAB — BRAIN NATRIURETIC PEPTIDE: B Natriuretic Peptide: 144 pg/mL — ABNORMAL HIGH (ref 0.0–100.0)

## 2020-05-18 IMAGING — DX DG CHEST 2V
2 series · 2 of 2 positions shown · non-contrast
Comparison: [DATE] and prior exams.

CLINICAL DATA: Chest pain.

EXAM:
CHEST - 2 VIEW

[chest pa]
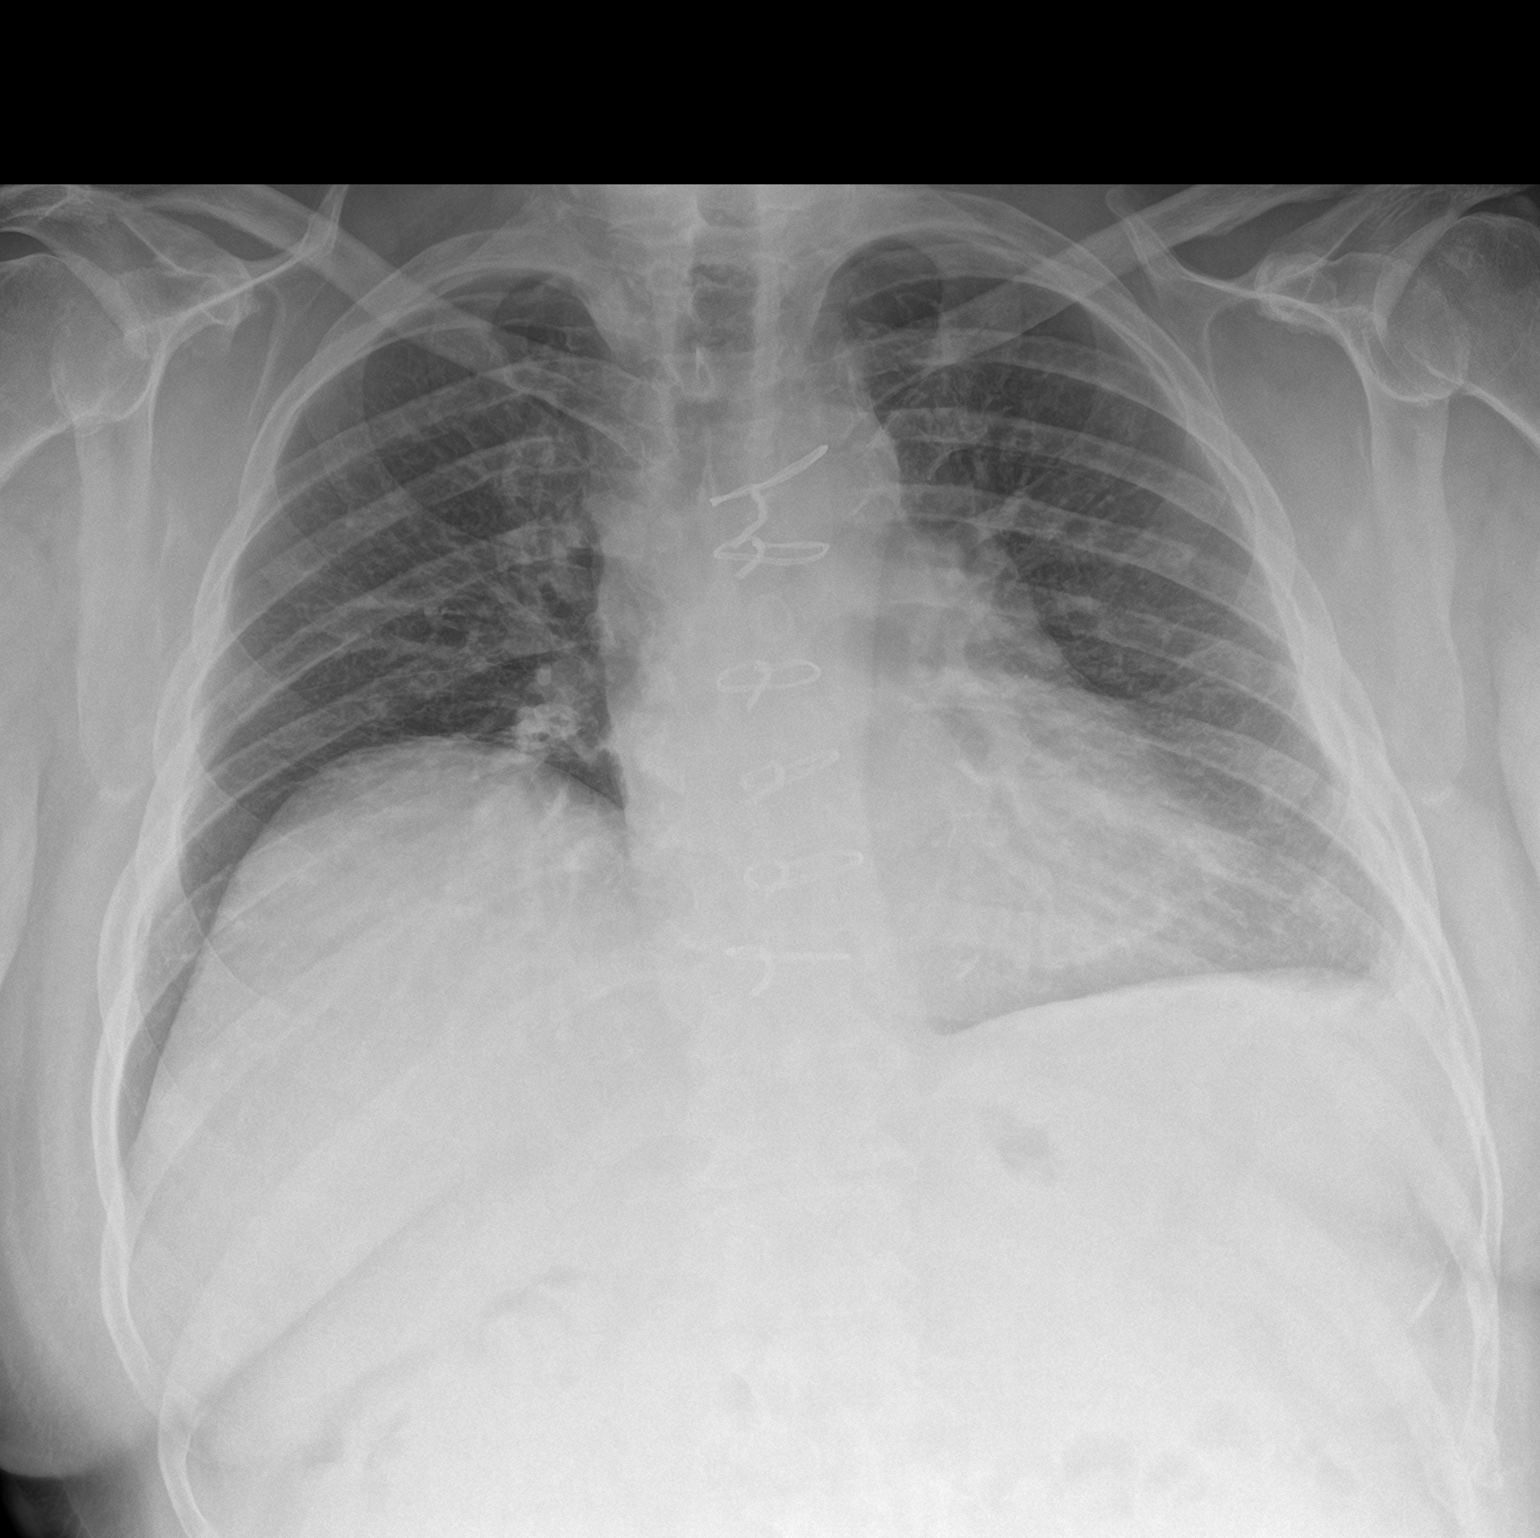

[chest lat]
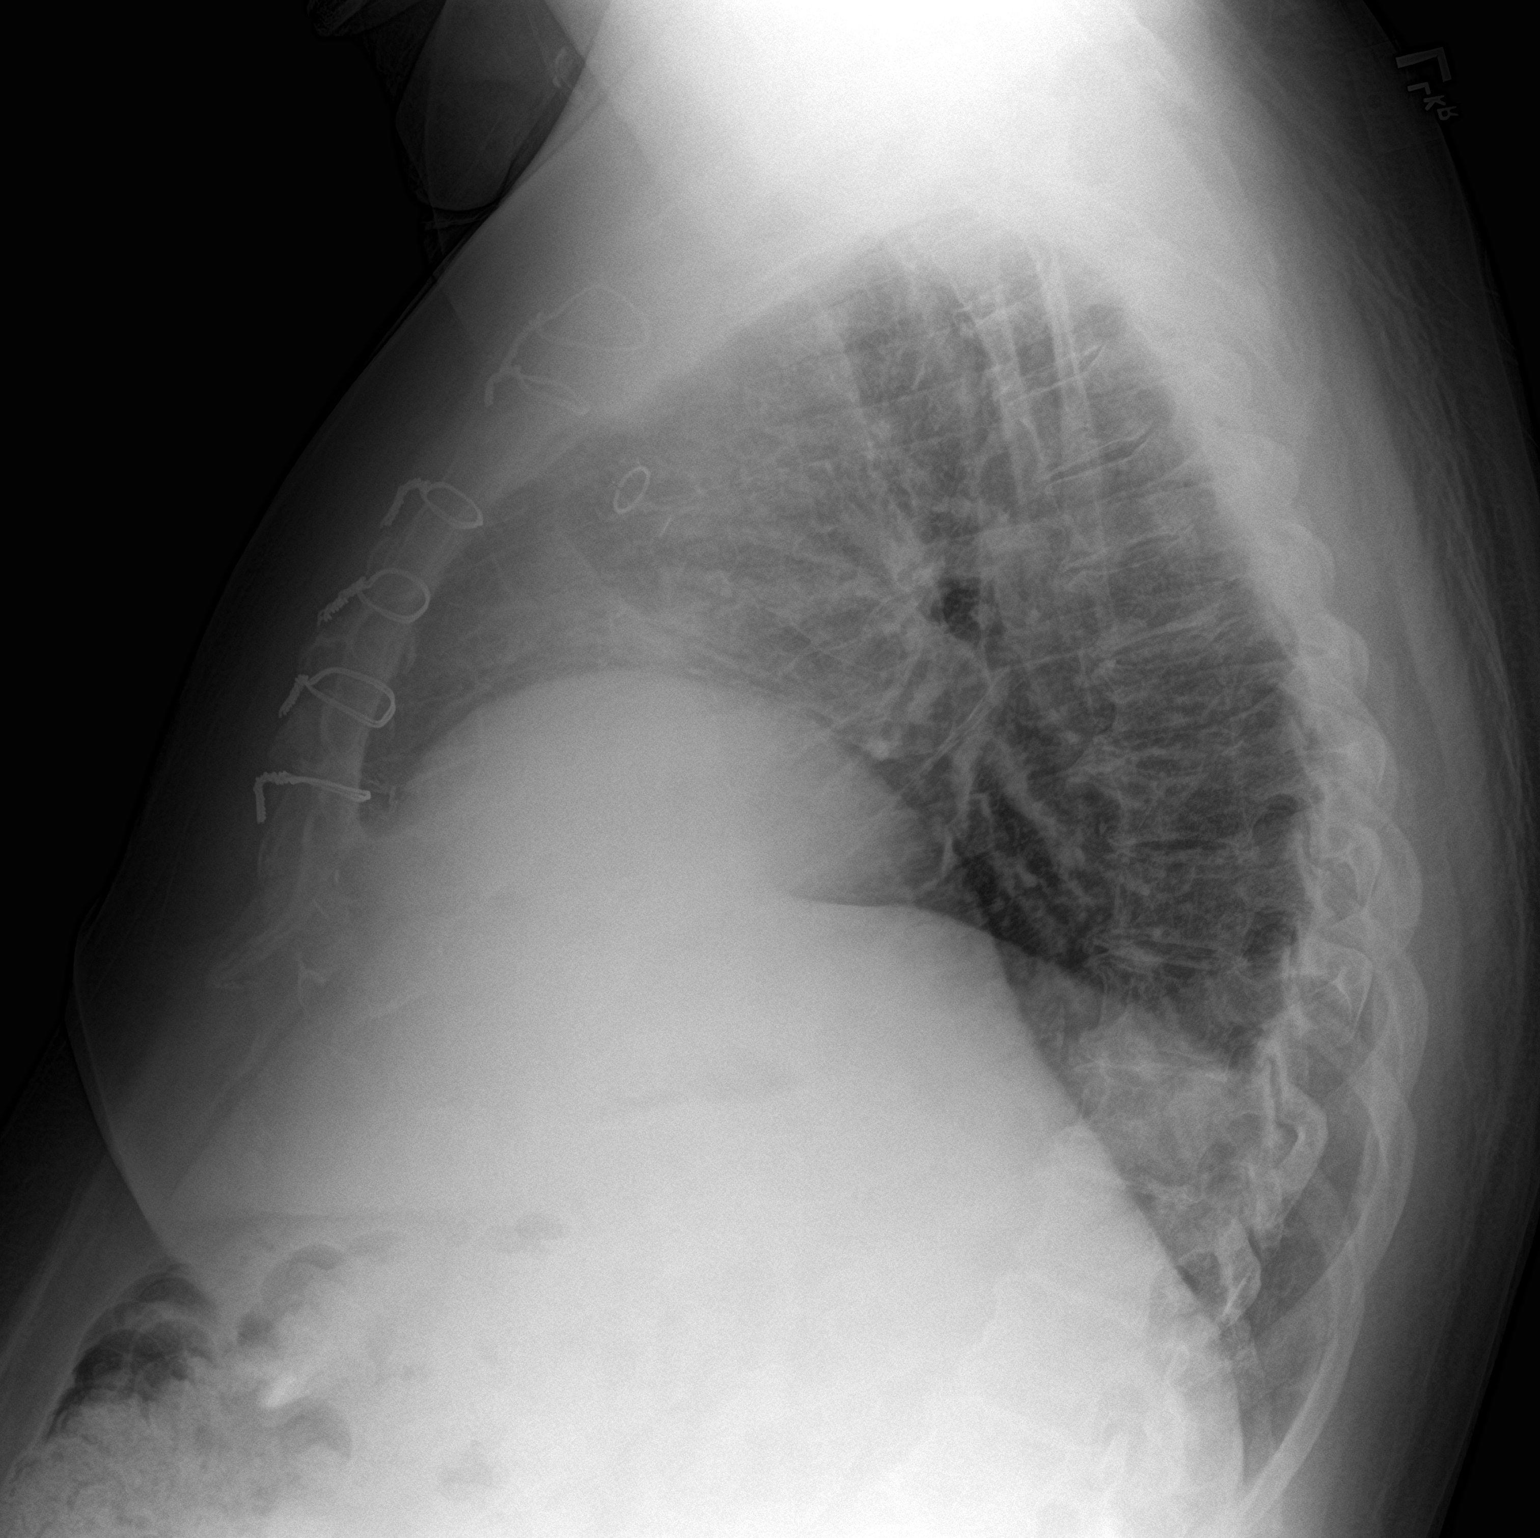

[2 of 2 positions shown; findings below may reference images not displayed]

FINDINGS: The cardiomediastinal silhouette is unchanged. CABG changes are
present.

Elevated RIGHT hemidiaphragm and mild chronic peribronchial
thickening again identified.

There is no evidence of focal airspace disease, pulmonary edema,
suspicious pulmonary nodule/mass, pleural effusion, or pneumothorax.

No acute bony abnormalities are identified.
IMPRESSION: No active cardiopulmonary disease.

## 2020-05-18 MED ORDER — CEPHALEXIN 500 MG PO CAPS: 500.0000 mg | ORAL_CAPSULE | Freq: Two times a day (BID) | ORAL | 0 refills | Status: AC

## 2020-05-18 NOTE — Patient Instructions (Signed)
Medication Instructions:  Your physician recommends that you continue on your current medications as directed. Please refer to the Current Medication list given to you today.  *If you need a refill on your cardiac medications before your next appointment, please call your pharmacy*   Lab Work: Your physician recommends that you return for lab work in: Today   If you have labs (blood work) drawn today and your tests are completely normal, you will receive your results only by: Marland Kitchen MyChart Message (if you have MyChart) OR . A paper copy in the mail If you have any lab test that is abnormal or we need to change your treatment, we will call you to review the results.   Testing/Procedures: A chest x-ray takes a picture of the organs and structures inside the chest, including the heart, lungs, and blood vessels. This test can show several things, including, whether the heart is enlarges; whether fluid is building up in the lungs; and whether pacemaker / defibrillator leads are still in place.   Follow-Up: At [facility], you and your health needs are our priority.  As part of our continuing mission to provide you with exceptional heart care, we have created designated Provider Care Teams.  These Care Teams include your primary Cardiologist (physician) and Advanced Practice Providers (APPs -  Physician Assistants and Nurse Practitioners) who all work together to provide you with the care you need, when you need it.  We recommend signing up for the patient portal called "MyChart".  Sign up information is provided on this After Visit Summary.  MyChart is used to connect with patients for Virtual Visits (Telemedicine).  Patients are able to view lab/test results, encounter notes, upcoming appointments, etc.  Non-urgent messages can be sent to your provider as well.   To learn more about what you can do with MyChart, go to [email or web address].    Your next appointment:   2-3 month(s)  The  format for your next appointment:   In Person  Provider:   Carlyle Dolly, MD   Other Instructions Thank you for choosing [facility]!

## 2020-05-18 NOTE — Progress Notes (Signed)
Cardiology Office Note    Date:  05/19/2020   ID:  Shane Chambers, DOB [birth year], MRN [id number]  PCP:  [name], MD  Cardiologist: Carlyle Dolly, MD    Chief Complaint  Patient presents with  . Follow-up    2 month visit    History of Present Illness:    Shane Chambers is a 59 y.o. male with past medical history of CAD (s/p CABG in 03/2020 with LIMA-LAD, RIMA-PL, [id number]), chronic systolic CHF (EF 74-16% by echo in 02/2020, at 40% by echo in 03/2020, normalized to 60-65% by echo in 04/2020), HTN, HLD and Type 2 DM who presents to the office today for 48-month follow-up.  He was last examined by Ermalinda Barrios, PA-C in 03/2020 following his recent CABG and was currently residing at [facility] in [facility] what but was hoping to return home soon. He had been using a walker and wheelchair as needed but denied any recent chest pain or dyspnea on exertion. He was continued on his current medication regimen at that time including ASA, Plavix, Lipitor, Lopressor and Losartan along with taking Lasix 20 mg daily.  In the interim, he presented to [facility] on 04/29/2020 for evaluation of coughing and back pain for the past 4 days. He was found to have acute hypoxic respiratory failure in the setting of a COPD exacerbation and likely PNA. BNP was minimally elevated to 210 and he was also treated for a CHF exacerbation with IV Lasix.  Repeat echocardiogram was performed and showed that his EF had normalized at 60 to 65% but he did have grade 2 diastolic dysfunction and moderate LVH. It appears Lasix was increased to 40 mg daily at the time of discharge and weight was at 279 lbs.   In talking with the patient today, he reports having fell a few weeks ago and landed on his chest. Since that time, he has experienced discomfort along his entire sternum which is typically worse with positional changes. He has been unable to follow full sternal cautions at home as he reports using his arms to scoot  up the stairs in his home. He has baseline dyspnea on exertion and reports only using his walker intermittently as he typically sits in a wheelchair for a majority of the day. He denies any specific orthopnea or PND but has experienced worsening lower extremity edema. He is listed as taking Lasix 40 mg daily but reports he has been taking 80 mg in AM/60mg  in PM and weight has remained stable.   Past Medical History:  Diagnosis Date  . CAD (coronary artery disease)    a. s/p CABG in 03/2020 with LIMA-LAD, RIMA-PL, RA-D1-RI-OM1  . Depression   . Diabetes mellitus without complication ([place])   . Hyperlipidemia 12/01/2019  . Hypertension   . Ischemic cardiomyopathy    a. EF 20-25% by echo in 02/2020 b. at 40% by echo in 03/2020 c. EF normalized to 60-65% by echo in 04/2020  . Type 2 diabetes mellitus ([place])     Past Surgical History:  Procedure Laterality Date  . CORONARY ARTERY BYPASS GRAFT N/A 03/13/2020   Procedure: CORONARY ARTERY BYPASS GRAFTING (CABG) times five using bilateral Internal mammary arteries and left radial artery;  Surgeon: [name], MD;  Location: [place];  Service: Open Heart Surgery;  Laterality: N/A;  . DENTAL SURGERY    . RADIAL ARTERY HARVEST Left 03/13/2020   Procedure: [place],;  Surgeon: [name], MD;  Location: [facility] OR;  Service: Open Heart Surgery;  Laterality: Left;  . RIGHT/LEFT HEART CATH AND CORONARY ANGIOGRAPHY N/A 03/07/2020   Procedure: RIGHT/LEFT HEART CATH AND CORONARY ANGIOGRAPHY;  Surgeon: Sherren Mocha, MD;  Location: [place] CV LAB;  Service: Cardiovascular;  Laterality: N/A;  . TEE WITHOUT CARDIOVERSION N/A 03/13/2020   Procedure: TRANSESOPHAGEAL ECHOCARDIOGRAM (TEE);  Surgeon: [name], MD;  Location: [place];  Service: Open Heart Surgery;  Laterality: N/A;    Current Medications: Outpatient Medications Prior to Visit  Medication Sig Dispense Refill  . acetaminophen (TYLENOL) 325 MG tablet Take 2 tablets (650 mg  total) by mouth every 4 (four) hours as needed for headache or mild pain. 30 tablet 0  . albuterol (VENTOLIN HFA) 108 (90 Base) MCG/ACT inhaler Inhale 1-2 puffs into the lungs every 6 (six) hours as needed for wheezing or shortness of breath.     Marland Kitchen alum & mag hydroxide-simeth (MAALOX PLUS) 400-400-40 MG/5ML suspension Take 30 mLs by mouth as needed for indigestion.    Marland Kitchen aspirin 81 MG EC tablet Take 81 mg by mouth daily.     Marland Kitchen atorvastatin (LIPITOR) 80 MG tablet Take 1 tablet (80 mg total) by mouth at bedtime.    . calcium carbonate (OS-CAL - DOSED IN MG OF ELEMENTAL CALCIUM) 1250 (500 Ca) MG tablet Take 1 tablet by mouth daily.    . clopidogrel (PLAVIX) 75 MG tablet Take 1 tablet (75 mg total) by mouth daily.    Marland Kitchen docusate sodium (COLACE) 100 MG capsule Take 1 capsule (100 mg total) by mouth 2 (two) times daily. 10 capsule 0  . ferrous sulfate 325 (65 FE) MG tablet Take 1 tablet (325 mg total) by mouth every other day. 30 tablet 3  . fluticasone (VERAMYST) 27.5 MCG/SPRAY nasal spray Place 2 sprays into the nose daily as needed for rhinitis or allergies.     . furosemide (LASIX) 20 MG tablet Take 2 tablets (40 mg total) by mouth daily. Take an extra 20mg  by mouth if noted to have greater than 3 lb weight gain in 24 hours. (Patient taking differently: Take 40 mg by mouth daily. 80 mg by mouth at night and 60 mg by mouth at night reported by pt.) 30 tablet 11  . gabapentin (NEURONTIN) 300 MG capsule Take 900 mg by mouth 2 (two) times daily.    Marland Kitchen guaiFENesin-dextromethorphan (ROBITUSSIN DM) 100-10 MG/5ML syrup Take 5 mLs by mouth every 4 (four) hours as needed for cough. 118 mL 0  . insulin aspart (NOVOLOG) 100 UNIT/ML injection Inject 0-20 Units into the skin 3 (three) times daily with meals. 10 mL 11  . insulin aspart (NOVOLOG) 100 UNIT/ML injection Inject 0-5 Units into the skin at bedtime. 10 mL 11  . insulin aspart protamine- aspart (NOVOLOG MIX 70/30) (70-30) 100 UNIT/ML injection Inject 0.55 mLs  (55 Units total) into the skin 2 (two) times daily with a meal. 10 mL 11  . losartan (COZAAR) 25 MG tablet Take 0.5 tablets (12.5 mg total) by mouth daily. (Patient taking differently: Take 25 mg by mouth daily. )    . MAGNESIUM PO Take 400 mg by mouth daily.     . metFORMIN (GLUCOPHAGE) 1000 MG tablet Take 1,000 mg by mouth 2 (two) times daily with a meal.    . metoprolol tartrate (LOPRESSOR) 25 MG tablet Take 1 tablet (25 mg total) by mouth 2 (two) times daily.    . Multiple Vitamins-Minerals (TAB-A-VITE) TABS Take 1 tablet by mouth daily.    Marland Kitchen  omeprazole (PRILOSEC) 40 MG capsule Take 40 mg by mouth daily.    . polyethylene glycol (MIRALAX / GLYCOLAX) 17 g packet Take 17 g by mouth daily as needed for mild constipation. 14 each 0  . potassium chloride (KLOR-CON) 10 MEQ tablet Take 1 tablet (10 mEq total) by mouth daily. 30 tablet 2  . tamsulosin (FLOMAX) 0.4 MG CAPS capsule Take 0.4 mg by mouth daily.    . traMADol (ULTRAM) 50 MG tablet Take 1-2 tablets (50-100 mg total) by mouth every 4 (four) hours as needed for moderate pain. 30 tablet 0  . venlafaxine XR (EFFEXOR-XR) 150 MG 24 hr capsule Take 150 mg by mouth daily with breakfast.     No facility-administered medications prior to visit.     Allergies:   Ace inhibitors, Other, and Penicillins   Social History   Socioeconomic History  . Marital status: Single    Spouse name: Not on file  . Number of children: Not on file  . Years of education: Not on file  . Highest education level: Not on file  Occupational History  . Not on file  Tobacco Use  . Smoking status: Former Research scientist (life sciences)  . Smokeless tobacco: Never Used  Vaping Use  . Vaping Use: Never used  Substance and Sexual Activity  . Alcohol use: Yes    Comment: rarely  . Drug use: No  . Sexual activity: Not on file  Other Topics Concern  . Not on file  Social History Narrative  . Not on file   Social Determinants of Health   Financial Resource Strain:   . Difficulty of  Paying Living Expenses:   Food Insecurity:   . Worried About Charity fundraiser in the Last Year:   . Arboriculturist in the Last Year:   Transportation Needs:   . Film/video editor (Medical):   Marland Kitchen Lack of Transportation (Non-Medical):   Physical Activity:   . Days of Exercise per Week:   . Minutes of Exercise per Session:   Stress:   . Feeling of Stress :   Social Connections:   . Frequency of Communication with Friends and Family:   . Frequency of Social Gatherings with Friends and Family:   . Attends Religious Services:   . Active Member of Clubs or Organizations:   . Attends Archivist Meetings:   Marland Kitchen Marital Status:      Family History:  The patient's family history includes Hypertension in his mother.   Review of Systems:   Please see the history of present illness.     General:  No chills, fever, night sweats or weight changes.  Cardiovascular:  No orthopnea, palpitations, paroxysmal nocturnal dyspnea. Positive for chest pain and edema.  Dermatological: No rash, lesions/masses Respiratory: No cough, dyspnea Urologic: No hematuria, dysuria Abdominal:   No nausea, vomiting, diarrhea, bright red blood per rectum, melena, or hematemesis Neurologic:  No visual changes, wkns, changes in mental status. All other systems reviewed and are otherwise negative except as noted above.   Physical Exam:    VS:  BP 134/70   Pulse 99   Ht 5\' 9"  (1.753 m)   Wt 281 lb 9.6 oz (127.7 kg)   SpO2 94%   BMI 41.59 kg/m    General: Well developed, well nourished,male appearing in no acute distress. Sitting in wheelchair.  Head: Normocephalic, atraumatic, sclera non-icteric.  Neck: No carotid bruits. JVD not elevated.  Lungs: Respirations regular and unlabored, without wheezes or  rales.  Heart: Regular rate and rhythm. No S3 or S4.  No murmur, no rubs, or gallops appreciated. Chest tube incision site with pus-like drainage noted and mild erythema around borders. Sternal  incision appears well-healed with no drainage or erythema.  Abdomen: Soft, non-tender, non-distended. No obvious abdominal masses. Msk:  Strength and tone appear normal for age. No obvious joint deformities or effusions. Extremities: No clubbing or cyanosis. 1+ pitting edema bilaterally.  Distal pedal pulses are 2+ bilaterally. Neuro: Alert and oriented X 3. Moves all extremities spontaneously. No focal deficits noted. Psych:  Responds to questions appropriately with a normal affect. Skin: No rashes or lesions noted  Wt Readings from Last 3 Encounters:  05/18/20 281 lb 9.6 oz (127.7 kg)  05/03/20 276 lb 7.3 oz (125.4 kg)  04/16/20 280 lb (127 kg)     Studies/Labs Reviewed:   EKG:  EKG is not ordered today.  Recent Labs: 03/05/2020: TSH 3.350 03/13/2020: ALT 30 03/14/2020: Magnesium 2.2 05/03/2020: Hemoglobin 9.1; Platelets 555 05/18/2020: B Natriuretic Peptide 144.0; BUN 23; Creatinine, Ser 0.83; Potassium 3.8; Sodium 138   Lipid Panel    Component Value Date/Time   CHOL 157 03/08/2020 0802   TRIG 144 03/08/2020 0802   HDL 36 (L) 03/08/2020 0802   CHOLHDL 4.4 03/08/2020 0802   VLDL 29 03/08/2020 0802   LDLCALC 92 03/08/2020 0802    Additional studies/ records that were reviewed today include:   Limited Echo: 02/2020 IMPRESSIONS    1. Left ventricular ejection fraction, by estimation, is 20 to 25%. The  left ventricle has severely decreased function. The left ventricle  demonstrates global hypokinesis. There is moderate left ventricular  hypertrophy. Left ventricular diastolic  parameters are consistent with Grade III diastolic dysfunction  (restrictive).  2. Right ventricular systolic function is low normal. The right  ventricular size is normal. Tricuspid regurgitation signal is inadequate  for assessing PA pressure.  3. Left atrial size was mildly dilated.  4. The mitral valve is grossly normal. Mild mitral valve regurgitation.  5. The aortic valve was not well  visualized. Aortic valve regurgitation  is not visualized. Mild aortic valve sclerosis is present, with no  evidence of aortic valve stenosis.  6. IVC dilated but respirophasic change not well visualized.   Cardiac Catheterization: 02/2020  Prox RCA lesion is 99% stenosed.  Prox RCA to Mid RCA lesion is 40% stenosed.  Dist RCA lesion is 30% stenosed.  Prox LAD to Mid LAD lesion is 80% stenosed.  1st Diag lesion is 60% stenosed.  Mid LM to Dist LM lesion is 40% stenosed.  Prox Cx to Mid Cx lesion is 95% stenosed.   1. 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 Moderately elevated LV/right heart filling pressures c/w known diagnosis of heart failure 3. Known severe LV dysfunction by echo  Recommend: review revascularization options. Pt has surgical anatomy, but not sure he will be a candidate for CABG. High-risk multivessel PCI could be considered if he is not a candidate for surgery.  Echocardiogram: 04/2020 IMPRESSIONS    1. Left ventricular ejection fraction, by estimation, is 60 to 65%. The  left ventricle has normal function. The left ventricle has no regional  wall motion abnormalities. There is moderate left ventricular hypertrophy.  Left ventricular diastolic  parameters are consistent with Grade II diastolic dysfunction  (pseudonormalization). Elevated left atrial pressure.  2. Right ventricular systolic  function is normal. The right ventricular  size is normal.  3. The mitral valve is normal in structure. No evidence of mitral valve  regurgitation. No evidence of mitral stenosis.  4. The aortic valve has an indeterminant number of cusps. Aortic valve  regurgitation is not visualized. No aortic stenosis is present.  5. The inferior vena cava is normal in size with greater than 50%  respiratory variability, suggesting right atrial  pressure of 3 mmHg.    Assessment:    1. Coronary artery disease involving coronary bypass graft of native heart without angina pectoris   2. Hx of CABG   3. Ischemic cardiomyopathy   4. Essential hypertension   5. Medication management   6. Hyperlipidemia LDL goal <70      Plan:   In order of problems listed above:  1. CAD/History of CABG - He is s/p CABG in 03/2020 with LIMA-LAD, RIMA-PL, RA-D1-RI-OM1. He did fall a few weeks ago and landed on his chest. Reports reproducible pain since but denies any exertional chest pain. Will repeat CXR today to make sure no acute changes. He does have drainage along his prior chest tube site with mild erythema. Will write for Keflex to cover for a skin infection. I did send a staff message to [facility] Surgery to make them aware and to see if he could come to the office for a wound check.  - Continue current medication regimen with ASA, Plavix, statin and BB therapy.    2. Ischemic Cardiomyopathy - EF was previously 20-25% by echo in 02/2020, at 40% by echo in 03/2020, normalized to 60-65% by echo in 04/2020. He does have 1+ pitting edema on examination today and I suspect this is due to dependent edema as he sits in his wheelchair a majority of the day. We reviewed the importance of elevating his lower extremities along with limiting sodium and fluid intake.  - He is currently taking Lasix 80mg  in AM/60mg  in PM. Will recheck BNP and BMET today. Pending results, would titrate to 80mg  BID. He remains on ARB and BB therapy.   3. HTN - BP is well-controlled at 134/70 during today's visit. Continue current medication regimen with Losartan 25mg  daily and Lopressor 25mg  BID.   4. HLD - LDL was at 92 in 02/2020 and he was started on Atorvastatin 80mg  daily. He reports having recent labs with his PCP and I will request a copy of these.     Medication Adjustments/Labs and Tests Ordered: Current medicines are reviewed at length with the patient today.   Concerns regarding medicines are outlined above.  Medication changes, Labs and Tests ordered today are listed in the Patient Instructions below. Patient Instructions  Medication Instructions:  Your physician recommends that you continue on your current medications as directed. Please refer to the Current Medication list given to you today.  *If you need a refill on your cardiac medications before your next appointment, please call your pharmacy*   Lab Work: Your physician recommends that you return for lab work in: Today   If you have labs (blood work) drawn today and your tests are completely normal, you will receive your results only by: Marland Kitchen MyChart Message (if you have MyChart) OR . A paper copy in the mail If you have any lab test that is abnormal or we need to change your treatment, we will call you to review the results.   Testing/Procedures: A chest x-ray takes a picture of the organs and structures inside the  chest, including the heart, lungs, and blood vessels. This test can show several things, including, whether the heart is enlarges; whether fluid is building up in the lungs; and whether pacemaker / defibrillator leads are still in place.   Follow-Up: At [facility], you and your health needs are our priority.  As part of our continuing mission to provide you with exceptional heart care, we have created designated Provider Care Teams.  These Care Teams include your primary Cardiologist (physician) and Advanced Practice Providers (APPs -  Physician Assistants and Nurse Practitioners) who all work together to provide you with the care you need, when you need it.  We recommend signing up for the patient portal called "MyChart".  Sign up information is provided on this After Visit Summary.  MyChart is used to connect with patients for Virtual Visits (Telemedicine).  Patients are able to view lab/test results, encounter notes, upcoming appointments, etc.  Non-urgent messages can be  sent to your provider as well.   To learn more about what you can do with MyChart, go to [email or web address].    Your next appointment:   2-3 month(s)  The format for your next appointment:   In Person  Provider:   Carlyle Dolly, MD   Other Instructions Thank you for choosing [facility]!       Signed, Erma Heritage, PA-C  05/19/2020 9:13 AM    [place] [address] [place], [place] [address] Phone: [phone] Fax: [phone]

## 2020-05-19 ENCOUNTER — Encounter: Payer: Self-pay | Admitting: Student

## 2020-05-21 ENCOUNTER — Ambulatory Visit: Payer: Self-pay | Admitting: Cardiothoracic Surgery

## 2020-05-22 ENCOUNTER — Telehealth: Payer: Self-pay | Admitting: *Deleted

## 2020-05-22 DIAGNOSIS — Z79899 Other long term (current) drug therapy: Secondary | ICD-10-CM

## 2020-05-22 MED ORDER — FUROSEMIDE 80 MG PO TABS
80.0000 mg | ORAL_TABLET | Freq: Two times a day (BID) | ORAL | 11 refills | Status: DC
Start: 2020-05-22 — End: 2020-08-07

## 2020-05-22 MED ORDER — POTASSIUM CHLORIDE CRYS ER 20 MEQ PO TBCR
20.0000 meq | EXTENDED_RELEASE_TABLET | Freq: Every day | ORAL | 3 refills | Status: DC
Start: 2020-05-22 — End: 2020-08-21

## 2020-05-22 NOTE — Telephone Encounter (Signed)
-----   Message from Erma Heritage, [place] sent at 05/18/2020  5:12 PM EDT ----- Please let the patient know that his fluid level remains elevated but has improved since his hospitalization. Electrolytes are within a normal range.  Kidney function has also remained stable. He did have edema on examination and given his elevated BNP would recommend titration of Lasix to 80mg  BID (he reported taking 80mg  in AM/60mg  in PM). He is currently listed as being on K-dur 10 mEq daily. Would increase to 20 mEq daily. Repeat BMET in 2 weeks.   Please forward a copy of results to [name], MD.

## 2020-05-22 NOTE — Telephone Encounter (Signed)
Pt notified orders placed  

## 2020-05-24 ENCOUNTER — Other Ambulatory Visit: Payer: Self-pay

## 2020-05-24 ENCOUNTER — Encounter: Payer: Self-pay | Admitting: Pulmonary Disease

## 2020-05-24 ENCOUNTER — Ambulatory Visit (INDEPENDENT_AMBULATORY_CARE_PROVIDER_SITE_OTHER): Payer: Medicaid Other | Admitting: Pulmonary Disease

## 2020-05-24 VITALS — BP 140/80 | HR 95 | Temp 97.9°F | Ht 69.0 in | Wt 280.6 lb

## 2020-05-24 DIAGNOSIS — J1282 Pneumonia due to coronavirus disease 2019: Secondary | ICD-10-CM

## 2020-05-24 DIAGNOSIS — J441 Chronic obstructive pulmonary disease with (acute) exacerbation: Secondary | ICD-10-CM

## 2020-05-24 DIAGNOSIS — R0602 Shortness of breath: Secondary | ICD-10-CM

## 2020-05-24 DIAGNOSIS — U071 COVID-19: Secondary | ICD-10-CM

## 2020-05-24 DIAGNOSIS — J9601 Acute respiratory failure with hypoxia: Secondary | ICD-10-CM

## 2020-05-24 DIAGNOSIS — G4733 Obstructive sleep apnea (adult) (pediatric): Secondary | ICD-10-CM | POA: Insufficient documentation

## 2020-05-24 MED ORDER — BUDESONIDE-FORMOTEROL FUMARATE 160-4.5 MCG/ACT IN AERO
2.0000 | INHALATION_SPRAY | Freq: Two times a day (BID) | RESPIRATORY_TRACT | 6 refills | Status: DC
Start: 2020-05-24 — End: 2024-02-25

## 2020-05-24 MED ORDER — ALBUTEROL SULFATE HFA 108 (90 BASE) MCG/ACT IN AERS: 1.0000 | INHALATION_SPRAY | Freq: Four times a day (QID) | RESPIRATORY_TRACT | 3 refills | Status: DC | PRN

## 2020-05-24 NOTE — Patient Instructions (Signed)
Trial of symbicort 160 - 2 puffs twice daily  Refills on albuterol MDI  Schedule split night study

## 2020-05-24 NOTE — Progress Notes (Signed)
Subjective:    Patient ID: Shane Chambers, male    DOB: 16-Jul-1961, 60 y.o.   MRN: [id number]  HPI   Chief Complaint  Patient presents with  . Hospitalization Follow-up    Patient tested positive in Jan. Has a few hospital admissions over last couple months. Shortness of breath with exertion. Sweating, denies cough. 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 Recent fall with chest injury  11/2019 -had Covid , adm x 5ds , Since then, he had chronic dyspnea on exertion, feeling out of breath, fatigued.  Admitted 02/2020 for NSTEMI, LHC showed CAD with low EF, underwent CABG x 5 on 5/4  Readmitted 6/20-6/24 for CHf, echo -nml EF , BNP 210, diuresed 2L , also treated as COPD flare with steroids & BDs , levaquin for Lt basal opacity  He denies frequent chest colds, reports intermittent wheezing with dyspnea.  He smoked for about 15 years starting as a teenager until the age of 50, maximal 1.5 packs/day, but 15 pack years before he quit in 1996 He was given [facility] in the hospital and this seemed to help  Current issues include -Chest pain, pleuritic on the right -Skin infection at chest tube site for which she was given Keflex and follow-up with cardiac surgeon has been arranged  CXR 05/18/20 elevated RT hemi-D ,clear lungs  He has always been a night owl,  reports insomnia, bedtime is around midnight, sleep is interrupted by frequent awakenings and is out of bed by 7 AM.  Loud snoring has been noted by family members, occasional gasping episodes that wake him up from sleep  He used to work for [employer] before he went on disability  Significant tests/ events reviewed  PFTs mod restriction, FVC 54%, ratio 82 CT angio chest 12/2019 >> BL inerstitial & air space opacities  Echo 04/2020 nml LVEF,gr 2 DD   Past Medical History:  Diagnosis Date  . CAD (coronary artery disease)    a. s/p CABG in 03/2020 with LIMA-LAD, RIMA-PL, RA-D1-RI-OM1  . Depression   . Diabetes mellitus without complication ([place])   . Hyperlipidemia 12/01/2019  . Hypertension   . Ischemic cardiomyopathy    a. EF 20-25% by echo in 02/2020 b. at 40% by echo in 03/2020 c. EF normalized to 60-65% by echo in 04/2020  . Type 2 diabetes mellitus ([place])    Past Surgical History:  Procedure Laterality Date  . CORONARY ARTERY BYPASS GRAFT N/A 03/13/2020   Procedure: CORONARY ARTERY BYPASS GRAFTING (CABG) times five using bilateral Internal mammary arteries and left radial artery;  Surgeon: [name], MD;  Location: [facility];  Service: Open Heart Surgery;  Laterality: N/A;  . DENTAL SURGERY    . RADIAL ARTERY HARVEST Left 03/13/2020   Procedure: RADIAL ARTERY HARVEST,;  Surgeon: [name], MD;  Location: [place];  Service: Open Heart Surgery;  Laterality: Left;  . RIGHT/LEFT HEART CATH AND CORONARY ANGIOGRAPHY N/A 03/07/2020   Procedure: RIGHT/LEFT HEART CATH AND CORONARY ANGIOGRAPHY;  Surgeon: Sherren Mocha, MD;  Location: [place];  Service: Cardiovascular;  Laterality: N/A;  . TEE WITHOUT CARDIOVERSION N/A 03/13/2020   Procedure: TRANSESOPHAGEAL ECHOCARDIOGRAM (TEE);  Surgeon: [name], MD;  Location: [place];  Service: Open Heart Surgery;  Laterality: N/A;    Allergies  Allergen Reactions  . Ace Inhibitors Swelling and Cough  . Other Itching     [name]  . Penicillins     Has patient had a PCN reaction causing immediate rash, facial/tongue/throat swelling, SOB or lightheadedness with hypotension: Unknown Has patient had a PCN reaction causing severe rash involving mucus membranes or skin necrosis: Unknown Has patient had a PCN reaction that required hospitalization: Unknown Has patient had a PCN reaction occurring within the last 10 years: Unknown If all of the above answers are "NO", then may proceed with Cephalosporin use.     Social History   Socioeconomic History  . Marital status: Single    Spouse name: Not on file  . Number of children: Not on file  . Years of education: Not on file  . Highest education level: Not on file  Occupational History  . Not on file  Tobacco Use  . Smoking status: Former Smoker    Packs/day: 1.50    Types: Cigarettes    Quit date: 05/25/1995    Years since quitting: 25.0  . Smokeless tobacco: Never Used  Vaping Use  . Vaping Use: Never used  Substance and Sexual Activity  . Alcohol use: Yes    Comment: rarely  . Drug use: No  . Sexual activity: Not on file  Other Topics Concern  . Not on file  Social History Narrative  . Not on file   Social Determinants of Health   Financial Resource Strain:   . Difficulty of Paying Living Expenses:   Food Insecurity:   . Worried About Charity fundraiser in the Last Year:   . Arboriculturist in the Last Year:   Transportation Needs:   . Film/video editor (Medical):   Marland Kitchen Lack of Transportation (Non-Medical):   Physical Activity:   . Days of Exercise per Week:   . Minutes of Exercise per Session:   Stress:   . Feeling of Stress :   Social Connections:   . Frequency of Communication with Friends and Family:   . Frequency of Social Gatherings with Friends and Family:   . Attends Religious Services:   . Active Member of Clubs or Organizations:   . Attends Archivist Meetings:   Marland Kitchen Marital Status:   Intimate Partner  Violence:   . Fear of Current or Ex-Partner:   . Emotionally Abused:   Marland Kitchen Physically Abused:   . Sexually Abused:      Family History  Problem Relation Age of Onset  . Hypertension Mother         Review of Systems Shortness of breath Chest pain, pleuritic, worse on inspiration Yellow drainage from chest tube site Ambulates with cane due to leg weakness No fevers, chills, productive cough     Objective:   Physical Exam  Gen. Pleasant, obese, in no distress ENT - no lesions, no post nasal drip Neck: No JVD, no thyromegaly, no carotid bruits Lungs: no use of accessory muscles, no dullness to percussion, decreased without rales or  rhonchi  Cardiovascular: Rhythm regular, heart sounds  normal, no murmurs or gallops, 2+ peripheral edema Musculoskeletal: No deformities, no cyanosis or clubbing , no tremors Ambulates with walker       Assessment & Plan:

## 2020-05-24 NOTE — Assessment & Plan Note (Signed)
Given excessive daytime somnolence, narrow pharyngeal exam, witnessed apneas & loud snoring, obstructive sleep apnea is very likely & an overnight polysomnogram will be scheduled as a split study. The pathophysiology of obstructive sleep apnea , it's cardiovascular consequences & modes of treatment including CPAP were discused with the patient in detail & they evidenced understanding. Pretest probability is high, he is hesitant to use CPAP but would be willing if he has severe OSA.  This will likely also help with his diaphragmatic weakness

## 2020-05-24 NOTE — Assessment & Plan Note (Signed)
I am not convinced that he has COPD, PFTs do not show airway obstruction, but show moderate restriction which is likely related to obesity.  Wonder if right hemidiaphragm weakness is contributing this seems to predate his recent CABG surgery, dates back to 2019. Infiltrates noted on his CT from February were likely related to Covid infection and the symptoms cleared on his x-rays.  He had subjective benefit with steroid/LABA combination so we will continue with Symbicort for now.  Main issue may be fluid overload and diastolic heart failure, he is being diuresed with 80 of Lasix twice daily

## 2020-05-28 ENCOUNTER — Ambulatory Visit: Payer: Self-pay | Admitting: Cardiothoracic Surgery

## 2020-05-28 ENCOUNTER — Encounter: Payer: Self-pay | Admitting: Cardiothoracic Surgery

## 2020-05-28 ENCOUNTER — Other Ambulatory Visit: Payer: Self-pay

## 2020-05-28 DIAGNOSIS — Z5189 Encounter for other specified aftercare: Secondary | ICD-10-CM | POA: Insufficient documentation

## 2020-05-28 NOTE — Progress Notes (Signed)
[address]       [place] [address]             [phone]     CARDIOTHORACIC SURGERY OFFICE NOTE  Referring Provider is Branch, Alphonse Guild, MD Primary Cardiologist is Carlyle Dolly, MD PCP is [name], MD   HPI:  59 yo man s/p CABG presents for evaluation of wound drainage as reported over the phone. Denies f/c Has been treating the area with hydrogen peroxide.  Sternum stable He fell recently and is worried the wound drainage results from the fall, questioning whether he damaged his chest incision.   Current Outpatient Medications  Medication Sig Dispense Refill  . acetaminophen (TYLENOL) 325 MG tablet Take 2 tablets (650 mg total) by mouth every 4 (four) hours as needed for headache or mild pain. 30 tablet 0  . albuterol (VENTOLIN HFA) 108 (90 Base) MCG/ACT inhaler Inhale 1-2 puffs into the lungs every 6 (six) hours as needed for wheezing or shortness of breath. 18 g 3  . alum & mag hydroxide-simeth (MAALOX PLUS) 400-400-40 MG/5ML suspension Take 30 mLs by mouth as needed for indigestion.    Marland Kitchen aspirin 81 MG EC tablet Take 81 mg by mouth daily.     Marland Kitchen atorvastatin (LIPITOR) 80 MG tablet Take 1 tablet (80 mg total) by mouth at bedtime.    . budesonide-formoterol (SYMBICORT) 160-4.5 MCG/ACT inhaler Inhale 2 puffs into the lungs in the morning and at bedtime. 1 Inhaler 6  . calcium carbonate (OS-CAL - DOSED IN MG OF ELEMENTAL CALCIUM) 1250 (500 Ca) MG tablet Take 1 tablet by mouth daily.    . clopidogrel (PLAVIX) 75 MG tablet Take 1 tablet (75 mg total) by mouth daily.    Marland Kitchen docusate sodium (COLACE) 100 MG capsule Take 1 capsule (100 mg total) by mouth 2 (two) times daily. 10 capsule 0  . ferrous sulfate 325 (65 FE) MG tablet Take 1 tablet (325 mg total) by mouth every other day. 30 tablet 3  . fluticasone (VERAMYST) 27.5 MCG/SPRAY nasal spray Place 2 sprays into the nose daily as needed for rhinitis or allergies.     . furosemide (LASIX) 80 MG  tablet Take 1 tablet (80 mg total) by mouth 2 (two) times daily. 60 tablet 11  . gabapentin (NEURONTIN) 300 MG capsule Take 900 mg by mouth 2 (two) times daily.    Marland Kitchen guaiFENesin-dextromethorphan (ROBITUSSIN DM) 100-10 MG/5ML syrup Take 5 mLs by mouth every 4 (four) hours as needed for cough. 118 mL 0  . insulin aspart (NOVOLOG) 100 UNIT/ML injection Inject 0-20 Units into the skin 3 (three) times daily with meals. 10 mL 11  . insulin aspart (NOVOLOG) 100 UNIT/ML injection Inject 0-5 Units into the skin at bedtime. 10 mL 11  . insulin aspart protamine- aspart (NOVOLOG MIX 70/30) (70-30) 100 UNIT/ML injection Inject 0.55 mLs (55 Units total) into the skin 2 (two) times daily with a meal. 10 mL 11  . losartan (COZAAR) 25 MG tablet Take 0.5 tablets (12.5 mg total) by mouth daily. (Patient taking differently: Take 25 mg by mouth daily. )    . MAGNESIUM PO Take 400 mg by mouth daily.     . metFORMIN (GLUCOPHAGE) 1000 MG tablet Take 1,000 mg by mouth 2 (two) times daily with a meal.    . metoprolol tartrate (LOPRESSOR) 25 MG tablet Take 1 tablet (25 mg total) by mouth 2 (two) times daily.    . Multiple Vitamins-Minerals (  TAB-A-VITE) TABS Take 1 tablet by mouth daily.    Marland Kitchen omeprazole (PRILOSEC) 40 MG capsule Take 40 mg by mouth daily.    . polyethylene glycol (MIRALAX / GLYCOLAX) 17 g packet Take 17 g by mouth daily as needed for mild constipation. 14 each 0  . potassium chloride SA (KLOR-CON) 20 MEQ tablet Take 1 tablet (20 mEq total) by mouth daily. 90 tablet 3  . tamsulosin (FLOMAX) 0.4 MG CAPS capsule Take 0.4 mg by mouth daily.    . traMADol (ULTRAM) 50 MG tablet Take 1-2 tablets (50-100 mg total) by mouth every 4 (four) hours as needed for moderate pain. 30 tablet 0  . venlafaxine XR (EFFEXOR-XR) 150 MG 24 hr capsule Take 150 mg by mouth daily with breakfast.     No current facility-administered medications for this visit.      Physical Exam:   BP 120/66 (BP Location: Right Arm, Patient  Position: Sitting, Cuff Size: Large)   Pulse 65   Resp (!) 21   Ht 5\' 9"  (1.753 m)   Wt 129.3 kg   SpO2 97% Comment: RA  BMI 42.09 kg/m   General:  Well-appearing, NAD  Chest:   cta  CV:   rrr  Incisions:  Sternum well-healed; right most chest tube site with delayed healing, no surrounding erythema; no appreciable drainage  Abdomen:  sntnd  Extremities:  Mild edema  Diagnostic Tests:  CXR with clear lung fields   Impression:  Delayed primary healing of chest tube exit site, not infected Plan:  Local wound care to chest tube exist site F/u with thoracic surgery prn  I spent in excess of 20 minutes during the conduct of this office consultation and >50% of this time involved direct face-to-face encounter with the patient for counseling and/or coordination of their care.  Level 2                 10 minutes Level 3                 15 minutes Level 4                 25 minutes Level 5                 40 minutes  B. Murvin Natal, MD 05/28/2020 4:29 PM

## 2020-05-31 ENCOUNTER — Telehealth: Payer: Self-pay | Admitting: Pulmonary Disease

## 2020-05-31 NOTE — Telephone Encounter (Signed)
Patient has a Split Night sleep study on 06/19/2020 at 8 pm. Attempted to call patient's mother and all numbers listed, unable to leave a message.

## 2020-06-01 NOTE — Telephone Encounter (Signed)
Attempted to call, unable to leave message.  

## 2020-06-02 ENCOUNTER — Encounter (HOSPITAL_COMMUNITY): Payer: Self-pay | Admitting: Emergency Medicine

## 2020-06-02 ENCOUNTER — Other Ambulatory Visit: Payer: Self-pay

## 2020-06-02 ENCOUNTER — Emergency Department (HOSPITAL_COMMUNITY)
Admission: EM | Admit: 2020-06-02 | Discharge: 2020-06-02 | Disposition: A | Discharge status: Home / Self Care | Payer: Medicaid Other | Attending: Emergency Medicine | Admitting: Emergency Medicine

## 2020-06-02 DIAGNOSIS — H6692 Otitis media, unspecified, left ear: Secondary | ICD-10-CM | POA: Diagnosis not present

## 2020-06-02 DIAGNOSIS — H6690 Otitis media, unspecified, unspecified ear: Secondary | ICD-10-CM

## 2020-06-02 DIAGNOSIS — Y92009 Unspecified place in unspecified non-institutional (private) residence as the place of occurrence of the external cause: Secondary | ICD-10-CM | POA: Diagnosis not present

## 2020-06-02 DIAGNOSIS — N401 Enlarged prostate with lower urinary tract symptoms: Secondary | ICD-10-CM | POA: Diagnosis not present

## 2020-06-02 DIAGNOSIS — Z87891 Personal history of nicotine dependence: Secondary | ICD-10-CM | POA: Diagnosis not present

## 2020-06-02 DIAGNOSIS — Y939 Activity, unspecified: Secondary | ICD-10-CM | POA: Diagnosis not present

## 2020-06-02 DIAGNOSIS — X58XXXA Exposure to other specified factors, initial encounter: Secondary | ICD-10-CM | POA: Diagnosis not present

## 2020-06-02 DIAGNOSIS — F329 Major depressive disorder, single episode, unspecified: Secondary | ICD-10-CM | POA: Insufficient documentation

## 2020-06-02 DIAGNOSIS — T162XXA Foreign body in left ear, initial encounter: Secondary | ICD-10-CM | POA: Diagnosis present

## 2020-06-02 DIAGNOSIS — E785 Hyperlipidemia, unspecified: Secondary | ICD-10-CM | POA: Diagnosis not present

## 2020-06-02 DIAGNOSIS — Z7984 Long term (current) use of oral hypoglycemic drugs: Secondary | ICD-10-CM | POA: Diagnosis not present

## 2020-06-02 DIAGNOSIS — I10 Essential (primary) hypertension: Secondary | ICD-10-CM | POA: Insufficient documentation

## 2020-06-02 DIAGNOSIS — E876 Hypokalemia: Secondary | ICD-10-CM | POA: Insufficient documentation

## 2020-06-02 DIAGNOSIS — Y999 Unspecified external cause status: Secondary | ICD-10-CM | POA: Insufficient documentation

## 2020-06-02 DIAGNOSIS — I2510 Atherosclerotic heart disease of native coronary artery without angina pectoris: Secondary | ICD-10-CM | POA: Diagnosis not present

## 2020-06-02 DIAGNOSIS — J189 Pneumonia, unspecified organism: Secondary | ICD-10-CM | POA: Diagnosis not present

## 2020-06-02 DIAGNOSIS — E119 Type 2 diabetes mellitus without complications: Secondary | ICD-10-CM | POA: Diagnosis not present

## 2020-06-02 MED ORDER — CIPROFLOXACIN-DEXAMETHASONE 0.3-0.1 % OT SUSP
4.0000 [drp] | Freq: Two times a day (BID) | OTIC | Status: DC
  Administered 2020-06-02: 4 [drp] via OTIC
  Filled 2020-06-02: qty 7.5

## 2020-06-02 MED ORDER — AZITHROMYCIN 250 MG PO TABS
250.0000 mg | ORAL_TABLET | Freq: Every day | ORAL | 0 refills | Status: DC
Start: 2020-06-03 — End: 2020-08-07

## 2020-06-02 MED ORDER — AZITHROMYCIN 250 MG PO TABS
500.0000 mg | ORAL_TABLET | Freq: Once | ORAL | Status: AC
  Administered 2020-06-02: 500 mg via ORAL
  Filled 2020-06-02: qty 2

## 2020-06-02 NOTE — ED Triage Notes (Signed)
Pt here due to "something" in his left ear.

## 2020-06-02 NOTE — ED Provider Notes (Signed)
[facility] Provider Note   CSN: [id number] Arrival date & time: 06/02/20  0998     History Chief Complaint  Patient presents with  . Foreign Body in [place] is a 59 y.o. male.  Patient presents to the emergency department with sensation of foreign body in the left ear.  He reports itching and pain of the left ear that began earlier today.  He reports that he has been sticking his finger and baby wipes down into his ear but cannot get anything out.        Past Medical History:  Diagnosis Date  . CAD (coronary artery disease)    a. s/p CABG in 03/2020 with LIMA-LAD, RIMA-PL, RA-D1-RI-OM1  . Depression   . Diabetes mellitus without complication ([place])   . Hyperlipidemia 12/01/2019  . Hypertension   . Ischemic cardiomyopathy    a. EF 20-25% by echo in 02/2020 b. at 40% by echo in 03/2020 c. EF normalized to 60-65% by echo in 04/2020  . Type 2 diabetes mellitus [facility])     Patient Active Problem List   Diagnosis Date Noted  . Visit for wound check 05/28/2020  . OSA (obstructive sleep apnea) 05/24/2020  . Acute exacerbation of chronic obstructive pulmonary disease (COPD) ([place]) 04/29/2020  . Leukocytosis 04/29/2020  . 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 Ischemic cardiomyopathy   . Coronary artery disease   . Acute on chronic combined systolic and diastolic CHF (congestive heart failure) ([place]) 03/05/2020  . Acute hyponatremia 03/05/2020  . Severe Vitamin D deficiency 12/02/2019  . Hypokalemia 12/01/2019  . Hyperlipidemia 12/01/2019  . BPH (benign prostatic hyperplasia) 12/01/2019  . Normocytic anemia 12/01/2019  . Pneumonia due to COVID-19 virus 12/01/2019  . Hypoxia   . SOB (shortness of breath) 11/16/2019  . Hypertension   . Type 2 diabetes mellitus ([place])   . Depression   . Acute bronchitis with bronchospasm 11/15/2019    Past Surgical History:  Procedure Laterality Date  . CORONARY ARTERY BYPASS GRAFT N/A 03/13/2020   Procedure:  CORONARY ARTERY BYPASS GRAFTING (CABG) times five using bilateral Internal mammary arteries and left radial artery;  Surgeon: [name], MD;  Location: [place] [facility];  Service: Open Heart Surgery;  Laterality: N/A;  . DENTAL SURGERY    . RADIAL ARTERY HARVEST Left 03/13/2020   Procedure: RADIAL ARTERY HARVEST,;  Surgeon: [name], MD;  Location: [facility];  Service: Open Heart Surgery;  Laterality: Left;  . RIGHT/LEFT HEART CATH AND CORONARY ANGIOGRAPHY N/A 03/07/2020   Procedure: RIGHT/LEFT HEART CATH AND CORONARY ANGIOGRAPHY;  Surgeon: Sherren Mocha, MD;  Location: [facility];  Service: Cardiovascular;  Laterality: N/A;  . TEE WITHOUT CARDIOVERSION N/A 03/13/2020   Procedure: TRANSESOPHAGEAL ECHOCARDIOGRAM (TEE);  Surgeon: [name], MD;  Location: [facility];  Service: Open Heart Surgery;  Laterality: N/A;       Family History  Problem Relation Age of Onset  . Hypertension Mother     Social History   Tobacco Use  . Smoking status: Former Smoker    Packs/day: 1.50    Types: Cigarettes    Quit date: 05/25/1995    Years since quitting: 25.0  . Smokeless tobacco: Never Used  Vaping Use  . Vaping Use: Never used  Substance Use Topics  . Alcohol use: Yes    Comment: rarely  . Drug use: No    Home Medications Prior to Admission medications   Medication Sig Start  Date End Date Taking? Authorizing Provider  acetaminophen (TYLENOL) 325 MG tablet Take 2 tablets (650 mg total) by mouth every 4 (four) hours as needed for headache or mild pain. 05/03/20 06/02/20  Shahmehdi, Valeria Batman, MD  albuterol (VENTOLIN HFA) 108 (90 Base) MCG/ACT inhaler Inhale 1-2 puffs into the lungs every 6 (six) hours as needed for wheezing or shortness of breath. 05/24/20   Rigoberto Noel, MD  alum & mag hydroxide-simeth (MAALOX PLUS) 400-400-40 MG/5ML suspension Take 30 mLs by mouth as needed for indigestion.    [provider]  aspirin 81 MG EC tablet Take 81 mg by mouth daily.  03/10/16    [provider]  atorvastatin (LIPITOR) 80 MG tablet Take 1 tablet (80 mg total) by mouth at bedtime. 03/19/20   Barrett, Erin R, PA-C  azithromycin (ZITHROMAX) 250 MG tablet Take 1 tablet (250 mg total) by mouth daily. Take first 2 tablets together, then 1 every day until finished. 06/03/20   Orpah Greek, MD  budesonide-formoterol (SYMBICORT) 160-4.5 MCG/ACT inhaler Inhale 2 puffs into the lungs in the morning and at bedtime. 05/24/20   Rigoberto Noel, MD  calcium carbonate (OS-CAL - DOSED IN MG OF ELEMENTAL CALCIUM) 1250 (500 Ca) MG tablet Take 1 tablet by mouth daily.    [provider]  clopidogrel (PLAVIX) 75 MG tablet Take 1 tablet (75 mg total) by mouth daily. 03/20/20   Barrett, Erin R, PA-C  docusate sodium (COLACE) 100 MG capsule Take 1 capsule (100 mg total) by mouth 2 (two) times daily. 12/25/19   Manuella Ghazi, Pratik D, DO  ferrous sulfate 325 (65 FE) MG tablet Take 1 tablet (325 mg total) by mouth every other day. 05/03/20   Shahmehdi, Valeria Batman, MD  fluticasone (VERAMYST) 27.5 MCG/SPRAY nasal spray Place 2 sprays into the nose daily as needed for rhinitis or allergies.     [provider]  furosemide (LASIX) 80 MG tablet Take 1 tablet (80 mg total) by mouth 2 (two) times daily. 05/22/20   Strader, Fransisco Hertz, PA-C  gabapentin (NEURONTIN) 300 MG capsule Take 900 mg by mouth 2 (two) times daily.    [provider]  guaiFENesin-dextromethorphan (ROBITUSSIN DM) 100-10 MG/5ML syrup Take 5 mLs by mouth every 4 (four) hours as needed for cough. 05/03/20   Shahmehdi, Valeria Batman, MD  insulin aspart (NOVOLOG) 100 UNIT/ML injection Inject 0-20 Units into the skin 3 (three) times daily with meals. 05/03/20   Shahmehdi, Valeria Batman, MD  insulin aspart (NOVOLOG) 100 UNIT/ML injection Inject 0-5 Units into the skin at bedtime. 05/03/20   Shahmehdi, Valeria Batman, MD  insulin aspart protamine- aspart (NOVOLOG MIX 70/30) (70-30) 100 UNIT/ML injection Inject 0.55 mLs (55 Units total) into  the skin 2 (two) times daily with a meal. 05/03/20   Shahmehdi, Valeria Batman, MD  losartan (COZAAR) 25 MG tablet Take 0.5 tablets (12.5 mg total) by mouth daily. Patient taking differently: Take 25 mg by mouth daily.  05/03/20   Shahmehdi, Valeria Batman, MD  MAGNESIUM PO Take 400 mg by mouth daily.     [provider]  metFORMIN (GLUCOPHAGE) 1000 MG tablet Take 1,000 mg by mouth 2 (two) times daily with a meal.    [provider]  metoprolol tartrate (LOPRESSOR) 25 MG tablet Take 1 tablet (25 mg total) by mouth 2 (two) times daily. 03/19/20   Barrett, Erin R, PA-C  Multiple Vitamins-Minerals (TAB-A-VITE) TABS Take 1 tablet by mouth daily. 07/27/19   [provider]  omeprazole (PRILOSEC) 40 MG capsule Take 40 mg by mouth daily.    [provider]  polyethylene glycol (MIRALAX / GLYCOLAX) 17 g packet Take 17 g by mouth daily as needed for mild constipation. 12/25/19   Manuella Ghazi, Pratik D, DO  potassium chloride SA (KLOR-CON) 20 MEQ tablet Take 1 tablet (20 mEq total) by mouth daily. 05/22/20   Strader, Fransisco Hertz, PA-C  tamsulosin (FLOMAX) 0.4 MG CAPS capsule Take 0.4 mg by mouth daily.    [provider]  traMADol (ULTRAM) 50 MG tablet Take 1-2 tablets (50-100 mg total) by mouth every 4 (four) hours as needed for moderate pain. 03/19/20   Barrett, Erin R, PA-C  venlafaxine XR (EFFEXOR-XR) 150 MG 24 hr capsule Take 150 mg by mouth daily with breakfast.    [provider]    Allergies    Ace inhibitors, Other, and Penicillins  Review of Systems   Review of Systems  Constitutional: Negative for fever.  HENT: Positive for ear pain.     Physical Exam Updated Vital Signs BP (!) 106/60 (BP Location: Right Arm)   Pulse (!) 106   Temp 99 F (37.2 C) (Oral)   Resp 18   Ht 5\' 9"  (1.753 m)   Wt (!) 127 kg   SpO2 98%   BMI 41.35 kg/m   Physical Exam Vitals and nursing note reviewed.  HENT:     Head: Normocephalic.     Left Ear: Tenderness present. No foreign  body. Tympanic membrane is erythematous and bulging.     Ears:     Comments: Excoriations to ear canal and pinna; no mastoid tendernesss or swelling Cardiovascular:     Rate and Rhythm: Normal rate and regular rhythm.  Pulmonary:     Effort: Pulmonary effort is normal.     Breath sounds: Normal breath sounds.  Musculoskeletal:     Right lower leg: Edema present.     Left lower leg: Edema present.  Neurological:     Mental Status: He is alert.     ED Results / Procedures / Treatments   Labs (all labs ordered are listed, but only abnormal results are displayed) Labs Reviewed - No data to display  EKG None  Radiology No results found.  Procedures Procedures (including critical care time)  Medications Ordered in ED Medications  ciprofloxacin-dexamethasone (CIPRODEX) 0.3-0.1 % OTIC (EAR) suspension 4 drop (has no administration in time range)  azithromycin (ZITHROMAX) tablet 500 mg (has no administration in time range)    ED Course  I have reviewed the triage vital signs and the nursing notes.  Pertinent labs & imaging results that were available during my care of the patient were reviewed by me and considered in my medical decision making (see chart for details).    MDM Rules/Calculators/A&P                          Tympanic membrane erythema and bulging with effusion, excoriations from scratching the ear.  Treat with Ciprodex and Zithromax. Final Clinical Impression(s) / ED Diagnoses Final diagnoses:  Acute otitis media, unspecified otitis media type    Rx / DC Orders ED Discharge Orders         Ordered    azithromycin (ZITHROMAX) 250 MG tablet  Daily     Discontinue  Reprint     06/02/20 0259           Orpah Greek, MD 06/02/20 0300

## 2020-06-04 ENCOUNTER — Encounter (HOSPITAL_COMMUNITY): Payer: Self-pay | Admitting: *Deleted

## 2020-06-04 ENCOUNTER — Other Ambulatory Visit: Payer: Self-pay

## 2020-06-04 ENCOUNTER — Emergency Department (HOSPITAL_COMMUNITY)
Admission: EM | Admit: 2020-06-04 | Discharge: 2020-06-04 | Disposition: A | Discharge status: Home / Self Care | Payer: Medicaid Other | Attending: Emergency Medicine | Admitting: Emergency Medicine

## 2020-06-04 DIAGNOSIS — Z794 Long term (current) use of insulin: Secondary | ICD-10-CM | POA: Insufficient documentation

## 2020-06-04 DIAGNOSIS — I2510 Atherosclerotic heart disease of native coronary artery without angina pectoris: Secondary | ICD-10-CM | POA: Diagnosis not present

## 2020-06-04 DIAGNOSIS — H9203 Otalgia, bilateral: Secondary | ICD-10-CM | POA: Diagnosis present

## 2020-06-04 DIAGNOSIS — I5041 Acute combined systolic (congestive) and diastolic (congestive) heart failure: Secondary | ICD-10-CM | POA: Diagnosis not present

## 2020-06-04 DIAGNOSIS — L219 Seborrheic dermatitis, unspecified: Secondary | ICD-10-CM | POA: Diagnosis not present

## 2020-06-04 DIAGNOSIS — R21 Rash and other nonspecific skin eruption: Secondary | ICD-10-CM | POA: Diagnosis not present

## 2020-06-04 DIAGNOSIS — I110 Hypertensive heart disease with heart failure: Secondary | ICD-10-CM | POA: Insufficient documentation

## 2020-06-04 DIAGNOSIS — E119 Type 2 diabetes mellitus without complications: Secondary | ICD-10-CM | POA: Diagnosis not present

## 2020-06-04 DIAGNOSIS — J449 Chronic obstructive pulmonary disease, unspecified: Secondary | ICD-10-CM | POA: Diagnosis not present

## 2020-06-04 DIAGNOSIS — Z7951 Long term (current) use of inhaled steroids: Secondary | ICD-10-CM | POA: Diagnosis not present

## 2020-06-04 DIAGNOSIS — Z79899 Other long term (current) drug therapy: Secondary | ICD-10-CM | POA: Insufficient documentation

## 2020-06-04 DIAGNOSIS — Z87891 Personal history of nicotine dependence: Secondary | ICD-10-CM | POA: Diagnosis not present

## 2020-06-04 DIAGNOSIS — H6012 Cellulitis of left external ear: Secondary | ICD-10-CM | POA: Diagnosis not present

## 2020-06-04 MED ORDER — KETOCONAZOLE 2 % EX CREA: 1.0000 "application " | TOPICAL_CREAM | Freq: Every day | CUTANEOUS | 0 refills | Status: DC

## 2020-06-04 MED ORDER — CEPHALEXIN 500 MG PO CAPS
500.0000 mg | ORAL_CAPSULE | Freq: Four times a day (QID) | ORAL | 0 refills | Status: DC
Start: 2020-06-04 — End: 2020-08-07

## 2020-06-04 NOTE — Discharge Instructions (Signed)
Apply ketoconazole cream twice a day for the next 2 weeks You can also apply hydrocortisone cream 1% twice a day to help with itching Take Keflex 4 times daily for 5 days for possible bacterial infection as well Please follow up with your doctor on Thursday

## 2020-06-04 NOTE — ED Triage Notes (Signed)
Pt states he was here last week c/o ear pain and told he has an ear infection to the left ear; pt states the pain has not resolved and is c/o pain to right ear

## 2020-06-04 NOTE — ED Provider Notes (Signed)
[facility] Provider Note   CSN: [id number] Arrival date & time: 06/04/20  1249     History Chief Complaint  Patient presents with  . Otalgia    Shane Chambers is a 59 y.o. male who presents with bilateral ear pain. He states symptoms started about a week ago. Initially he only had symptoms in his left ear and now over the past week it's over his right ear too. He reports pain, itching, and the feeling like something is moving around in his ear. He came to the ED last week and was diagnosed with an ear infection. He was given a Z-pack and ciprodex which he has been using without relief. 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   HPI     Past Medical History:  Diagnosis Date  . CAD (coronary artery disease)    a. s/p CABG in 03/2020 with LIMA-LAD, RIMA-PL, RA-D1-RI-OM1  . Depression   . Diabetes mellitus without complication ([place])   . Hyperlipidemia 12/01/2019  . Hypertension   . Ischemic cardiomyopathy    a. EF 20-25% by echo in 02/2020 b. at 40% by echo in 03/2020 c. EF normalized to 60-65% by echo in 04/2020  . Type 2 diabetes mellitus [facility])     Patient Active Problem List   Diagnosis Date Noted  . Visit for wound check 05/28/2020  . OSA (obstructive sleep apnea) 05/24/2020  . Acute exacerbation of chronic obstructive pulmonary disease (COPD) ([place]) 04/29/2020  . Leukocytosis 04/29/2020  . 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 Ischemic cardiomyopathy   . Coronary artery disease   . Acute on chronic combined systolic and diastolic CHF (congestive heart failure) ([place]) 03/05/2020  . Acute hyponatremia 03/05/2020  . Severe Vitamin D deficiency 12/02/2019  . Hypokalemia 12/01/2019  . Hyperlipidemia 12/01/2019  . BPH (benign prostatic hyperplasia) 12/01/2019  . Normocytic anemia 12/01/2019  . Pneumonia due to COVID-19 virus 12/01/2019  . Hypoxia   . SOB (shortness of breath) 11/16/2019  . Hypertension   . Type 2 diabetes mellitus ([name])   .  Depression   . Acute bronchitis with bronchospasm 11/15/2019    Past Surgical History:  Procedure Laterality Date  . CORONARY ARTERY BYPASS GRAFT N/A 03/13/2020   Procedure: CORONARY ARTERY BYPASS GRAFTING (CABG) times five using bilateral Internal mammary arteries and left radial artery;  Surgeon: [name], MD;  Location: [facility];  Service: Open Heart Surgery;  Laterality: N/A;  . DENTAL SURGERY    . RADIAL ARTERY HARVEST Left 03/13/2020   Procedure: RADIAL ARTERY HARVEST,;  Surgeon: [name], MD;  Location: [facility];  Service: Open Heart Surgery;  Laterality: Left;  . RIGHT/LEFT HEART CATH AND CORONARY ANGIOGRAPHY N/A 03/07/2020   Procedure: RIGHT/LEFT HEART CATH AND CORONARY ANGIOGRAPHY;  Surgeon: Sherren Mocha, MD;  Location: [facility];  Service: Cardiovascular;  Laterality: N/A;  . TEE WITHOUT CARDIOVERSION N/A 03/13/2020   Procedure: TRANSESOPHAGEAL ECHOCARDIOGRAM (TEE);  Surgeon: [name], MD;  Location: [facility];  Service: Open Heart Surgery;  Laterality: N/A;       Family History  Problem Relation Age of Onset  . Hypertension Mother     Social History   Tobacco Use  . Smoking status: Former Smoker    Packs/day: 1.50    Types: Cigarettes    Quit date: 05/25/1995    Years since quitting: 25.0  . Smokeless tobacco: Never Used  Vaping Use  . Vaping Use: Never used  Substance Use Topics  . Alcohol use: Yes    Comment: rarely  . Drug use: No    Home Medications Prior to Admission medications   Medication Sig Start Date End Date Taking? Authorizing Provider  albuterol (VENTOLIN HFA) 108 (90 Base) MCG/ACT inhaler Inhale 1-2 puffs into the lungs every 6 (six) hours as needed for wheezing or shortness of breath. 05/24/20   Rigoberto Noel, MD  alum & mag hydroxide-simeth (MAALOX PLUS) 400-400-40 MG/5ML suspension Take 30 mLs by mouth as needed for indigestion.    [provider]  aspirin 81 MG EC tablet Take 81 mg by mouth daily.  03/10/16    [provider]  atorvastatin (LIPITOR) 80 MG tablet Take 1 tablet (80 mg total) by mouth at bedtime. 03/19/20   Barrett, Erin R, PA-C  azithromycin (ZITHROMAX) 250 MG tablet Take 1 tablet (250 mg total) by mouth daily. Take first 2 tablets together, then 1 every day until finished. 06/03/20   Orpah Greek, MD  budesonide-formoterol (SYMBICORT) 160-4.5 MCG/ACT inhaler Inhale 2 puffs into the lungs in the morning and at bedtime. 05/24/20   Rigoberto Noel, MD  calcium carbonate (OS-CAL - DOSED IN MG OF ELEMENTAL CALCIUM) 1250 (500 Ca) MG tablet Take 1 tablet by mouth daily.    [provider]  clopidogrel (PLAVIX) 75 MG tablet Take 1 tablet (75 mg total) by mouth daily. 03/20/20   Barrett, Erin R, PA-C  docusate sodium (COLACE) 100 MG capsule Take 1 capsule (100 mg total) by mouth 2 (two) times daily. 12/25/19   Manuella Ghazi, Pratik D, DO  ferrous sulfate 325 (65 FE) MG tablet Take 1 tablet (325 mg total) by mouth every other day. 05/03/20   Shahmehdi, Valeria Batman, MD  fluticasone (VERAMYST) 27.5 MCG/SPRAY nasal spray Place 2 sprays into the nose daily as needed for rhinitis or allergies.     [provider]  furosemide (LASIX) 80 MG tablet Take 1 tablet (80 mg total) by mouth 2 (two) times daily. 05/22/20   Strader, Fransisco Hertz, PA-C  gabapentin (NEURONTIN) 300 MG capsule Take 900 mg by mouth 2 (two) times daily.    [provider]  guaiFENesin-dextromethorphan (ROBITUSSIN DM) 100-10 MG/5ML syrup Take 5 mLs by mouth every 4 (four) hours as needed for cough. 05/03/20   Shahmehdi, Valeria Batman, MD  insulin aspart (NOVOLOG) 100 UNIT/ML injection Inject 0-20 Units into the skin 3 (three) times daily with meals. 05/03/20   Shahmehdi, Valeria Batman, MD  insulin aspart (NOVOLOG) 100 UNIT/ML injection Inject 0-5 Units into the skin at bedtime. 05/03/20   Shahmehdi, Valeria Batman, MD  insulin aspart protamine- aspart (NOVOLOG MIX 70/30) (70-30) 100 UNIT/ML injection Inject 0.55 mLs (55 Units total) into  the skin 2 (two) times daily with a meal. 05/03/20   Shahmehdi, Valeria Batman, MD  losartan (COZAAR) 25 MG tablet Take 0.5 tablets (12.5 mg total) by mouth daily. Patient taking differently: Take 25 mg by mouth daily.  05/03/20   Shahmehdi, Valeria Batman, MD  MAGNESIUM PO Take 400 mg by mouth daily.     [provider]  metFORMIN (GLUCOPHAGE) 1000 MG tablet Take 1,000 mg by mouth 2 (two) times daily with a meal.    [provider]  metoprolol tartrate (LOPRESSOR) 25 MG tablet Take 1 tablet (25 mg total) by mouth 2 (two) times daily. 03/19/20   Barrett, Erin R, PA-C  Multiple Vitamins-Minerals (TAB-A-VITE) TABS Take 1 tablet by mouth daily. 07/27/19   [provider]  omeprazole (PRILOSEC) 40 MG capsule Take 40 mg by mouth daily.    [provider]  polyethylene glycol (MIRALAX / GLYCOLAX) 17 g packet Take 17 g by mouth daily as needed for mild constipation. 12/25/19   Manuella Ghazi, Pratik D, DO  potassium chloride SA (KLOR-CON) 20 MEQ tablet Take 1 tablet (20 mEq total) by mouth daily. 05/22/20   Strader, Fransisco Hertz, PA-C  tamsulosin (FLOMAX) 0.4 MG CAPS capsule Take 0.4 mg by mouth daily.    [provider]  traMADol (ULTRAM) 50 MG tablet Take 1-2 tablets (50-100 mg total) by mouth every 4 (four) hours as needed for moderate pain. 03/19/20   Barrett, Erin R, PA-C  venlafaxine XR (EFFEXOR-XR) 150 MG 24 hr capsule Take 150 mg by mouth daily with breakfast.    [provider]    Allergies    Ace inhibitors, Other, and Penicillins  Review of Systems   Review of Systems  Constitutional: Negative for fever.  HENT: Positive for ear pain. Negative for ear discharge.   Skin: Positive for rash.    Physical Exam Updated Vital Signs BP (!) 141/85 (BP Location: Right Arm)   Pulse 88   Temp (!) 97.4 F (36.3 C) (Oral)   Resp 18   Ht 5\' 9"  (1.753 m)   Wt (!) 127 kg   SpO2 98%   BMI 41.35 kg/m   Physical Exam Vitals and nursing note reviewed.  Constitutional:       General: He is not in acute distress.    Appearance: He is well-developed. He is obese.     Comments: Chronically ill appearing male in NAD  HENT:     Head: Normocephalic and atraumatic.     Right Ear: Tympanic membrane and ear canal normal. Tenderness present. No foreign body.     Left Ear: Tympanic membrane and ear canal normal. Tenderness present. No foreign body.     Ears:     Comments: There is a rash around the external ears bilaterally with flaky skin and excoriations which looks consistent with seborrheic dermatitis. The rash is also behind the ears Eyes:     General: No scleral icterus.       Right eye: No discharge.        Left eye: No discharge.     Conjunctiva/sclera: Conjunctivae normal.     Pupils: Pupils are equal, round, and reactive to light.  Cardiovascular:     Rate and Rhythm: Normal rate.  Pulmonary:     Effort: Pulmonary effort is normal. No respiratory distress.  Abdominal:     General: There is no distension.  Musculoskeletal:     Cervical back: Normal range of motion.  Skin:    General: Skin is warm and dry.  Neurological:     Mental Status: He is alert and oriented to person, place, and time.  Psychiatric:        Behavior: Behavior normal.     ED Results / Procedures / Treatments   Labs (all labs ordered are listed, but only abnormal results are displayed) Labs Reviewed - No data to display  EKG None  Radiology No results found.  Procedures Procedures (including critical care time)  Medications Ordered in ED Medications - No data to display  ED Course  I have reviewed the triage vital signs and the nursing notes.  Pertinent labs & imaging results that were available during my care of the patient were reviewed by me and considered in my medical decision making (see  chart for details).  59 year old male with bilateral ear pain L>R with associated rash. Rash looks like a seborrheic dermatitis however there are multiple excoriations because  pt is having itching and FB sensation as well. Symptoms a likely related to his uncontrolled DM. He is not getting relief with previously prescribed antibiotics. His TMs bilaterally appear normal and I don't appreciate an otitis externa. Will treat with ketoconazole cream and Keflex for possible superimposed bacterial cellulitis. He has a f/u visit with his PCP this Thursday and advised to have ears rechecked at that time.  MDM Rules/Calculators/A&P                           Final Clinical Impression(s) / ED Diagnoses Final diagnoses:  Seborrheic dermatitis  Cellulitis of left ear    Rx / DC Orders ED Discharge Orders    None       Recardo Evangelist, PA-C 06/04/20 1552    Fredia Sorrow, MD 06/08/20 [phone]

## 2020-06-05 NOTE — Telephone Encounter (Signed)
Attempted to call pt's mother Estill Bamberg but unable to reach and unable to leave a VM due to mailbox not being set up. Will try to call back later.

## 2020-06-06 NOTE — Telephone Encounter (Signed)
We have attempted to contact pt's mother several times with no success or call back from her. Per triage protocol, message will be closed.

## 2020-07-26 ENCOUNTER — Encounter (HOSPITAL_COMMUNITY): Payer: Self-pay | Admitting: Physical Therapy

## 2020-07-26 NOTE — Therapy (Unsigned)
[facility] [place], [place], [address] Phone: [phone]   Fax:  [phone]  Patient Details  Name: Shane Chambers MRN: [id number] Date of Birth: 1961-10-27 Referring Provider:  No ref. provider found  Encounter Date: 07/26/2020  PHYSICAL THERAPY DISCHARGE SUMMARY  Visits from Start of Care: 6  Current functional level related to goals / functional outcomes: unknown   Remaining deficits: unknown   Education / Equipment: HEP  Plan: Patient agrees to discharge.  Patient goals were not met. Patient is being discharged due to not returning since the last visit.  ?????    Rayetta Humphrey, PT CLT [phone] 07/26/2020, 1:25 PM  [facility] [address] [place], [place], [address] Phone: [phone]   Fax:  [phone]

## 2020-08-01 ENCOUNTER — Inpatient Hospital Stay (HOSPITAL_COMMUNITY)
Admission: EM | Admit: 2020-08-01 | Discharge: 2020-08-07 | DRG: 291 | Disposition: A | Discharge status: Home Health | Payer: Medicaid Other | Attending: Internal Medicine | Admitting: Internal Medicine

## 2020-08-01 ENCOUNTER — Encounter (HOSPITAL_COMMUNITY): Payer: Self-pay | Admitting: Emergency Medicine

## 2020-08-01 ENCOUNTER — Emergency Department (HOSPITAL_COMMUNITY): Payer: Medicaid Other

## 2020-08-01 ENCOUNTER — Inpatient Hospital Stay (HOSPITAL_COMMUNITY): Payer: Medicaid Other

## 2020-08-01 ENCOUNTER — Other Ambulatory Visit: Payer: Self-pay

## 2020-08-01 DIAGNOSIS — Z951 Presence of aortocoronary bypass graft: Secondary | ICD-10-CM | POA: Diagnosis not present

## 2020-08-01 DIAGNOSIS — D72829 Elevated white blood cell count, unspecified: Secondary | ICD-10-CM | POA: Diagnosis present

## 2020-08-01 DIAGNOSIS — Z8249 Family history of ischemic heart disease and other diseases of the circulatory system: Secondary | ICD-10-CM

## 2020-08-01 DIAGNOSIS — I110 Hypertensive heart disease with heart failure: Secondary | ICD-10-CM | POA: Diagnosis present

## 2020-08-01 DIAGNOSIS — G4733 Obstructive sleep apnea (adult) (pediatric): Secondary | ICD-10-CM | POA: Diagnosis present

## 2020-08-01 DIAGNOSIS — J449 Chronic obstructive pulmonary disease, unspecified: Secondary | ICD-10-CM | POA: Diagnosis present

## 2020-08-01 DIAGNOSIS — F329 Major depressive disorder, single episode, unspecified: Secondary | ICD-10-CM | POA: Diagnosis present

## 2020-08-01 DIAGNOSIS — J90 Pleural effusion, not elsewhere classified: Secondary | ICD-10-CM

## 2020-08-01 DIAGNOSIS — J9621 Acute and chronic respiratory failure with hypoxia: Secondary | ICD-10-CM | POA: Diagnosis present

## 2020-08-01 DIAGNOSIS — J918 Pleural effusion in other conditions classified elsewhere: Secondary | ICD-10-CM | POA: Diagnosis present

## 2020-08-01 DIAGNOSIS — Z6841 Body Mass Index (BMI) 40.0 and over, adult: Secondary | ICD-10-CM | POA: Diagnosis not present

## 2020-08-01 DIAGNOSIS — I2510 Atherosclerotic heart disease of native coronary artery without angina pectoris: Secondary | ICD-10-CM | POA: Diagnosis present

## 2020-08-01 DIAGNOSIS — Z7902 Long term (current) use of antithrombotics/antiplatelets: Secondary | ICD-10-CM

## 2020-08-01 DIAGNOSIS — F39 Unspecified mood [affective] disorder: Secondary | ICD-10-CM | POA: Diagnosis present

## 2020-08-01 DIAGNOSIS — Z87891 Personal history of nicotine dependence: Secondary | ICD-10-CM

## 2020-08-01 DIAGNOSIS — R0602 Shortness of breath: Secondary | ICD-10-CM | POA: Diagnosis present

## 2020-08-01 DIAGNOSIS — Z20822 Contact with and (suspected) exposure to covid-19: Secondary | ICD-10-CM | POA: Diagnosis present

## 2020-08-01 DIAGNOSIS — E785 Hyperlipidemia, unspecified: Secondary | ICD-10-CM | POA: Diagnosis present

## 2020-08-01 DIAGNOSIS — Z23 Encounter for immunization: Secondary | ICD-10-CM | POA: Diagnosis not present

## 2020-08-01 DIAGNOSIS — K219 Gastro-esophageal reflux disease without esophagitis: Secondary | ICD-10-CM | POA: Diagnosis present

## 2020-08-01 DIAGNOSIS — I509 Heart failure, unspecified: Secondary | ICD-10-CM

## 2020-08-01 DIAGNOSIS — Z9109 Other allergy status, other than to drugs and biological substances: Secondary | ICD-10-CM

## 2020-08-01 DIAGNOSIS — I10 Essential (primary) hypertension: Secondary | ICD-10-CM | POA: Diagnosis not present

## 2020-08-01 DIAGNOSIS — Z888 Allergy status to other drugs, medicaments and biological substances status: Secondary | ICD-10-CM

## 2020-08-01 DIAGNOSIS — D509 Iron deficiency anemia, unspecified: Secondary | ICD-10-CM | POA: Diagnosis present

## 2020-08-01 DIAGNOSIS — I255 Ischemic cardiomyopathy: Secondary | ICD-10-CM | POA: Diagnosis present

## 2020-08-01 DIAGNOSIS — Z79899 Other long term (current) drug therapy: Secondary | ICD-10-CM

## 2020-08-01 DIAGNOSIS — N400 Enlarged prostate without lower urinary tract symptoms: Secondary | ICD-10-CM | POA: Diagnosis present

## 2020-08-01 DIAGNOSIS — Z7951 Long term (current) use of inhaled steroids: Secondary | ICD-10-CM

## 2020-08-01 DIAGNOSIS — E669 Obesity, unspecified: Secondary | ICD-10-CM | POA: Diagnosis present

## 2020-08-01 DIAGNOSIS — I5033 Acute on chronic diastolic (congestive) heart failure: Secondary | ICD-10-CM | POA: Diagnosis present

## 2020-08-01 DIAGNOSIS — R0600 Dyspnea, unspecified: Secondary | ICD-10-CM

## 2020-08-01 DIAGNOSIS — J441 Chronic obstructive pulmonary disease with (acute) exacerbation: Secondary | ICD-10-CM

## 2020-08-01 DIAGNOSIS — I358 Other nonrheumatic aortic valve disorders: Secondary | ICD-10-CM | POA: Diagnosis present

## 2020-08-01 DIAGNOSIS — Z9889 Other specified postprocedural states: Secondary | ICD-10-CM

## 2020-08-01 DIAGNOSIS — Z880 Allergy status to penicillin: Secondary | ICD-10-CM

## 2020-08-01 DIAGNOSIS — Z7982 Long term (current) use of aspirin: Secondary | ICD-10-CM

## 2020-08-01 DIAGNOSIS — E1142 Type 2 diabetes mellitus with diabetic polyneuropathy: Secondary | ICD-10-CM | POA: Diagnosis present

## 2020-08-01 DIAGNOSIS — Z794 Long term (current) use of insulin: Secondary | ICD-10-CM

## 2020-08-01 DIAGNOSIS — Z9111 Patient's noncompliance with dietary regimen: Secondary | ICD-10-CM | POA: Diagnosis not present

## 2020-08-01 LAB — CBC WITH DIFFERENTIAL/PLATELET
Abs Immature Granulocytes: 0.42 10*3/uL — ABNORMAL HIGH (ref 0.00–0.07)
Basophils Absolute: 0.1 10*3/uL (ref 0.0–0.1)
Basophils Relative: 1 %
Eosinophils Absolute: 0.8 10*3/uL — ABNORMAL HIGH (ref 0.0–0.5)
Eosinophils Relative: 5 %
HCT: 36.4 % — ABNORMAL LOW (ref 39.0–52.0)
Hemoglobin: 10.9 g/dL — ABNORMAL LOW (ref 13.0–17.0)
Immature Granulocytes: 3 %
Lymphocytes Relative: 15 %
Lymphs Abs: 2.4 10*3/uL (ref 0.7–4.0)
MCH: 25.8 pg — ABNORMAL LOW (ref 26.0–34.0)
MCHC: 29.9 g/dL — ABNORMAL LOW (ref 30.0–36.0)
MCV: 86.1 fL (ref 80.0–100.0)
Monocytes Absolute: 1.7 10*3/uL — ABNORMAL HIGH (ref 0.1–1.0)
Monocytes Relative: 10 %
Neutro Abs: 11.2 10*3/uL — ABNORMAL HIGH (ref 1.7–7.7)
Neutrophils Relative %: 66 %
Platelets: 471 10*3/uL — ABNORMAL HIGH (ref 150–400)
RBC: 4.23 MIL/uL (ref 4.22–5.81)
RDW: 16.3 % — ABNORMAL HIGH (ref 11.5–15.5)
WBC: 16.7 10*3/uL — ABNORMAL HIGH (ref 4.0–10.5)
nRBC: 0 % (ref 0.0–0.2)

## 2020-08-01 LAB — LACTATE DEHYDROGENASE, PLEURAL OR PERITONEAL FLUID: LD, Fluid: 480 U/L — ABNORMAL HIGH (ref 3–23)

## 2020-08-01 LAB — CBG MONITORING, ED
Glucose-Capillary: 194 mg/dL — ABNORMAL HIGH (ref 70–99)
Glucose-Capillary: 297 mg/dL — ABNORMAL HIGH (ref 70–99)
Glucose-Capillary: 260 mg/dL — ABNORMAL HIGH (ref 70–99)

## 2020-08-01 LAB — TROPONIN I (HIGH SENSITIVITY)
Troponin I (High Sensitivity): 11 ng/L (ref <18)
Troponin I (High Sensitivity): 9 ng/L (ref <18)

## 2020-08-01 LAB — BODY FLUID CELL COUNT WITH DIFFERENTIAL
Eos, Fluid: 4 %
Lymphs, Fluid: 18 %
Monocyte-Macrophage-Serous Fluid: 10 % — ABNORMAL LOW (ref 50–90)
Neutrophil Count, Fluid: 68 % — ABNORMAL HIGH (ref 0–25)
Total Nucleated Cell Count, Fluid: 3714 cu mm — ABNORMAL HIGH (ref 0–1000)

## 2020-08-01 LAB — COMPREHENSIVE METABOLIC PANEL
ALT: 20 U/L (ref 0–44)
AST: 27 U/L (ref 15–41)
Albumin: 3.8 g/dL (ref 3.5–5.0)
Alkaline Phosphatase: 108 U/L (ref 38–126)
Anion gap: 16 — ABNORMAL HIGH (ref 5–15)
BUN: 23 mg/dL — ABNORMAL HIGH (ref 6–20)
CO2: 28 mmol/L (ref 22–32)
Calcium: 9.6 mg/dL (ref 8.9–10.3)
Chloride: 93 mmol/L — ABNORMAL LOW (ref 98–111)
Creatinine, Ser: 1.1 mg/dL (ref 0.61–1.24)
GFR calc Af Amer: >60 mL/min (ref >60)
GFR calc non Af Amer: >60 mL/min (ref >60)
Glucose, Bld: 74 mg/dL (ref 70–99)
Potassium: 3.9 mmol/L (ref 3.5–5.1)
Sodium: 137 mmol/L (ref 135–145)
Total Bilirubin: 0.7 mg/dL (ref 0.3–1.2)
Total Protein: 7.9 g/dL (ref 6.5–8.1)

## 2020-08-01 LAB — SARS CORONAVIRUS 2 BY RT PCR (HOSPITAL ORDER, PERFORMED IN ~~LOC~~ HOSPITAL LAB): SARS Coronavirus 2: NEGATIVE

## 2020-08-01 LAB — GRAM STAIN

## 2020-08-01 LAB — PROTEIN, PLEURAL OR PERITONEAL FLUID: Total protein, fluid: 4.5 g/dL

## 2020-08-01 LAB — HEMOGLOBIN A1C
Hgb A1c MFr Bld: 7.3 % — ABNORMAL HIGH (ref 4.8–5.6)
Mean Plasma Glucose: 162.81 mg/dL

## 2020-08-01 LAB — BRAIN NATRIURETIC PEPTIDE: B Natriuretic Peptide: 141 pg/mL — ABNORMAL HIGH (ref 0.0–100.0)

## 2020-08-01 LAB — GLUCOSE, CAPILLARY: Glucose-Capillary: 198 mg/dL — ABNORMAL HIGH (ref 70–99)

## 2020-08-01 IMAGING — DX DG CHEST 1V PORT
1 series · 1 of 1 positions shown · non-contrast
Comparison: [DATE]

CLINICAL DATA: Dyspnea

EXAM:
PORTABLE CHEST 1 VIEW

[chest ap]
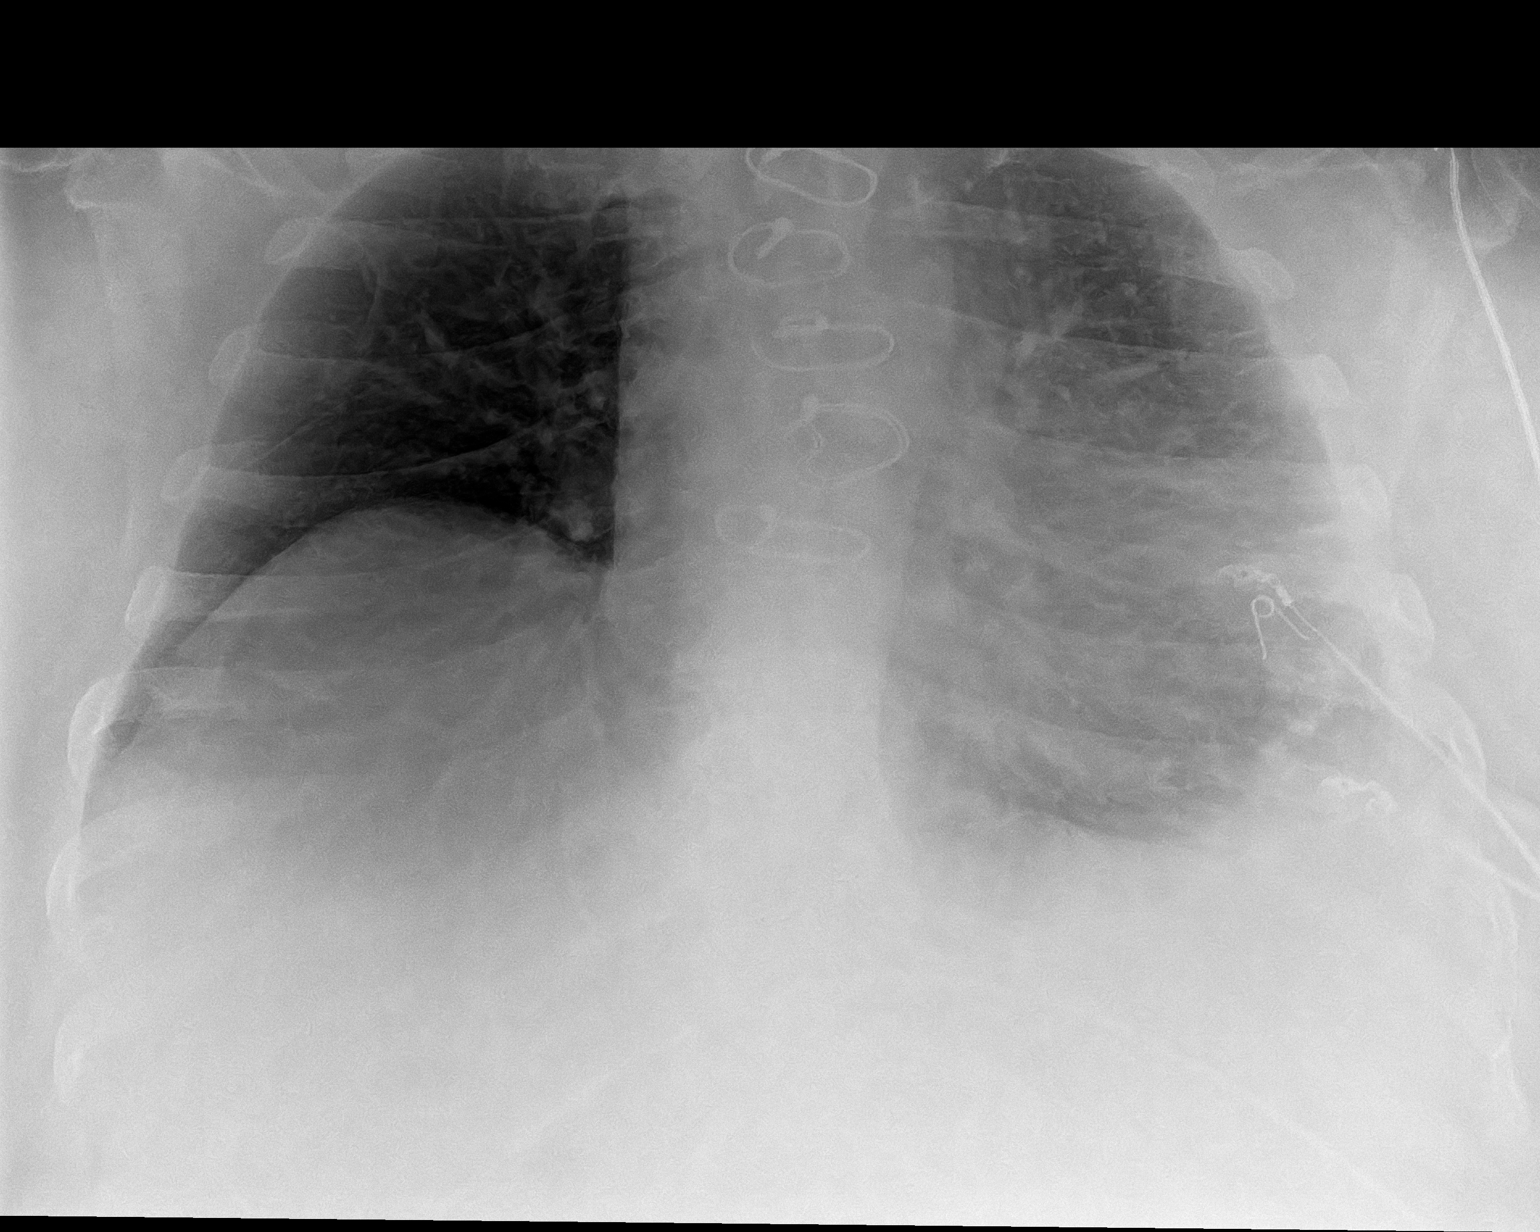

[1 of 1 positions shown; findings below may reference images not displayed]

FINDINGS: The patient is quite lordotic on this semi upright chest radiograph.
Small left pleural effusion is again identified and has slightly
increased in size since prior examination. Lungs are otherwise
clear. No pneumothorax. No pleural effusion on the right. Median
sternotomy has been performed. Cardiac size is likely stable when
accounting for altered patient positioning. Pulmonary vascularity is
normal.
IMPRESSION: Enlarging, small left pleural effusion.

## 2020-08-01 IMAGING — DX DG CHEST 1V
1 series · 1 of 1 positions shown · non-contrast
Comparison: [DATE]

CLINICAL DATA: Post thoracentesis

EXAM:
CHEST  1 VIEW

[chest ap]
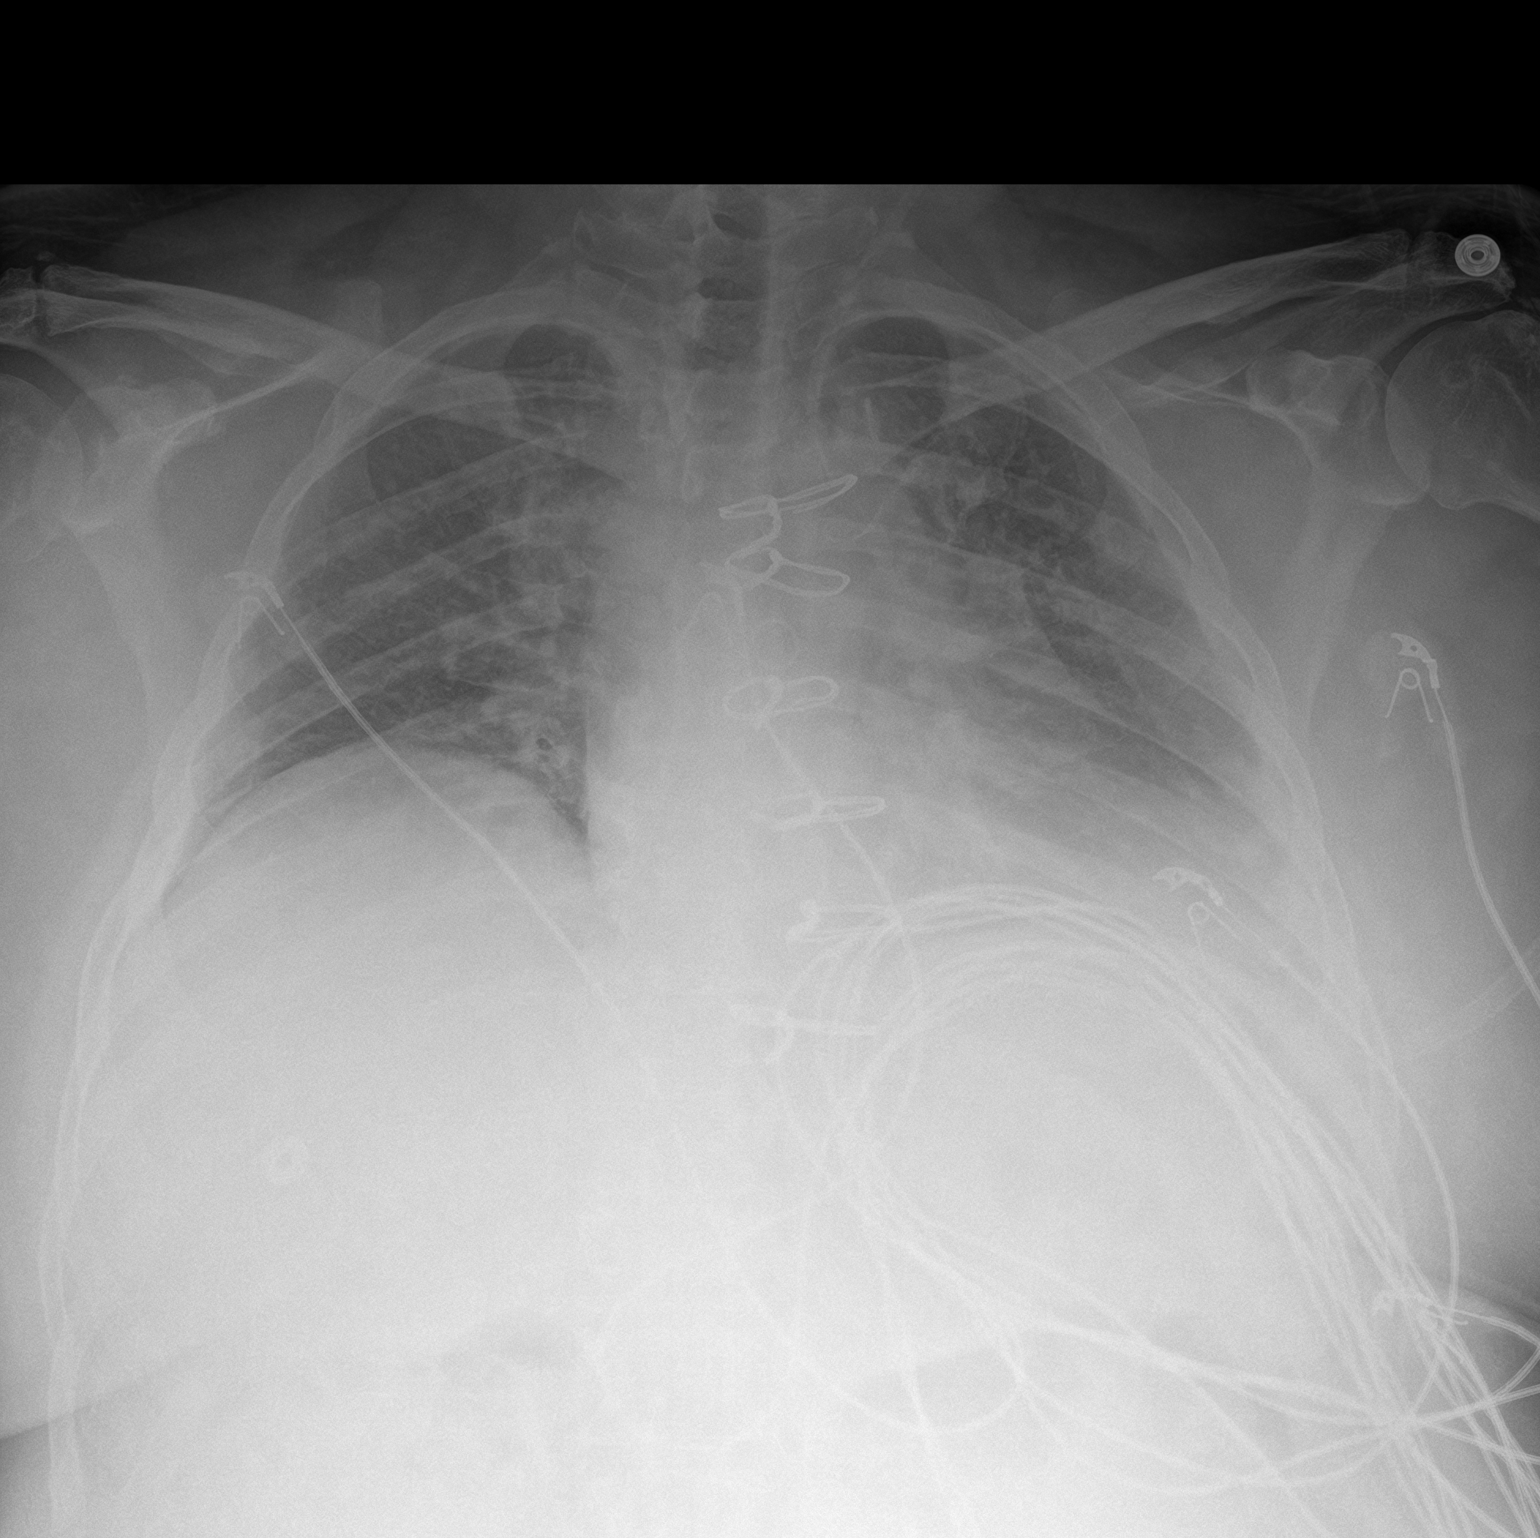

[1 of 1 positions shown; findings below may reference images not displayed]

FINDINGS: Changes of CABG. Very low lung volumes. Patchy left lung airspace
disease and right basilar opacities may reflect atelectasis or
infiltrates. Suspect small left pleural effusion. No pneumothorax
following thoracentesis.
IMPRESSION: No pneumothorax following left thoracentesis.

Small left pleural effusion. Patchy bilateral airspace disease, left
greater than right, atelectasis versus infiltrates/pneumonia.

Very low lung volumes.

## 2020-08-01 IMAGING — CT CT ANGIO CHEST
2 of 6 series · 18 of 46 positions shown · IV contrast (omnipaque)
Comparison: [DATE]

CLINICAL DATA: Dyspnea

EXAM:
CT ANGIOGRAPHY CHEST WITH CONTRAST
TECHNIQUE: Multidetector CT imaging of the chest was performed using the
standard protocol during bolus administration of intravenous
contrast. Multiplanar CT image reconstructions and MIPs were
obtained to evaluate the vascular anatomy.
CONTRAST:  100mL OMNIPAQUE IOHEXOL 350 MG/ML SOLN

[Series 5: pe axial thins · axial · 0.81mm/px · z∈[-72,+157]mm · 15 of 251 slices shown]
[im 11/251  lung]
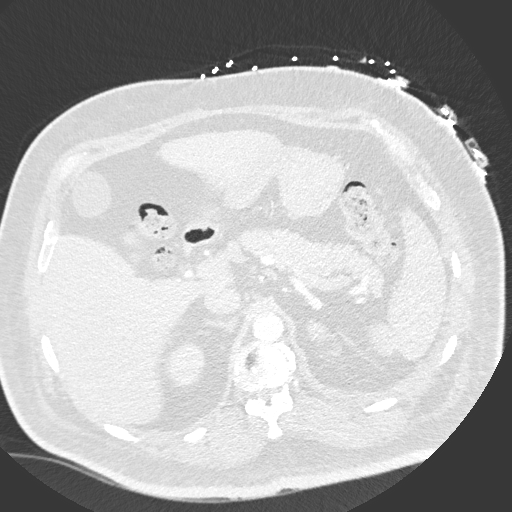
[im 33/251  soft-tissue]
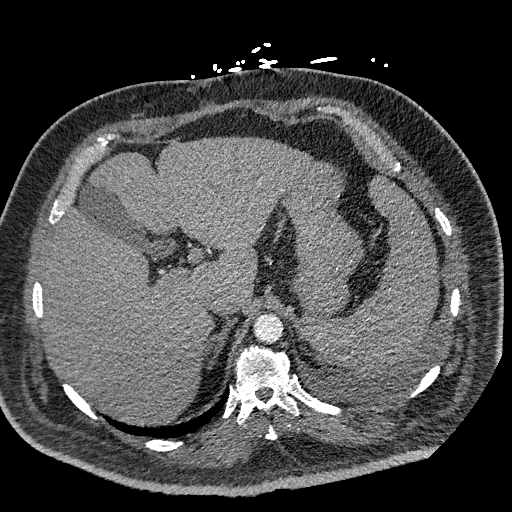
[im 44/251  lung]
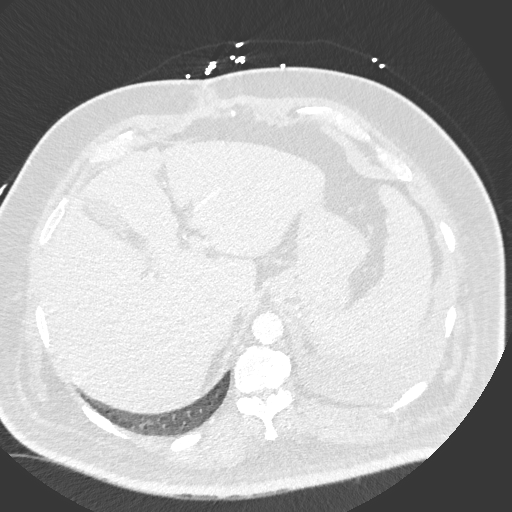
[im 66/251  soft-tissue]
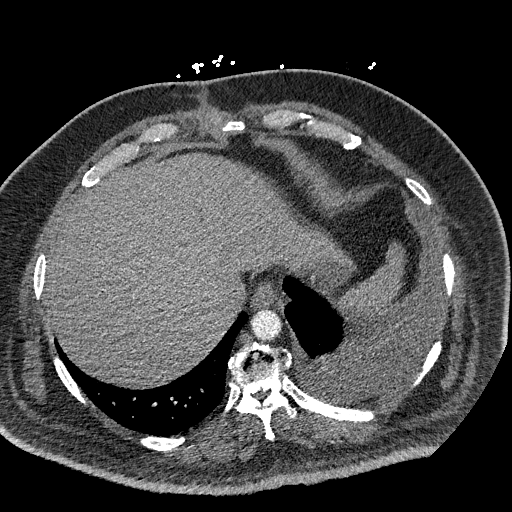
[im 77/251  lung]
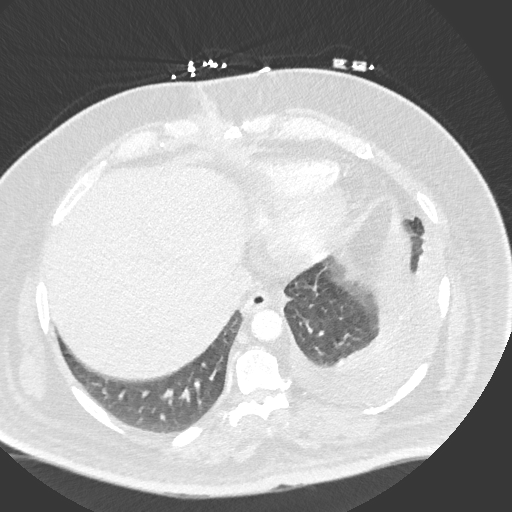
[im 98/251  soft-tissue]
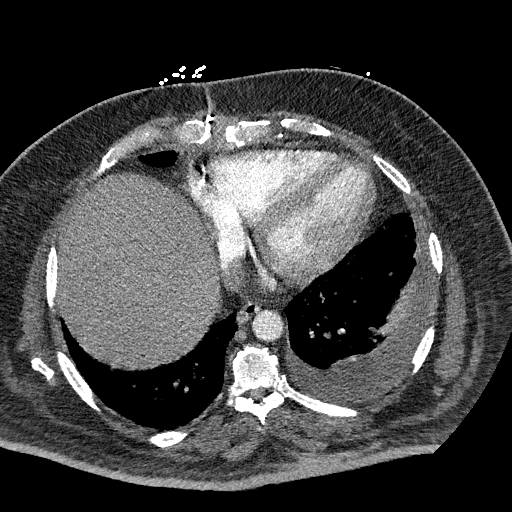
[im 109/251  lung]
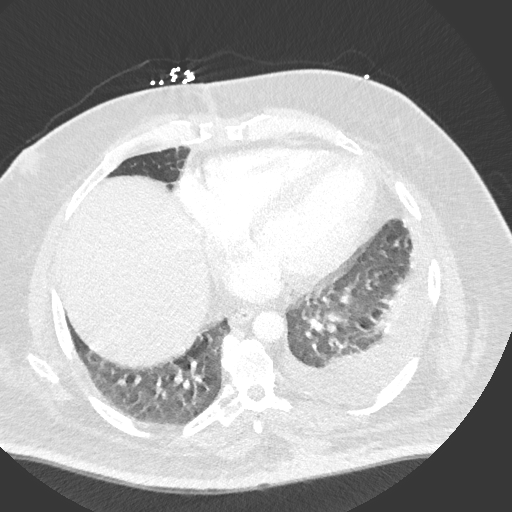
[im 131/251  soft-tissue]
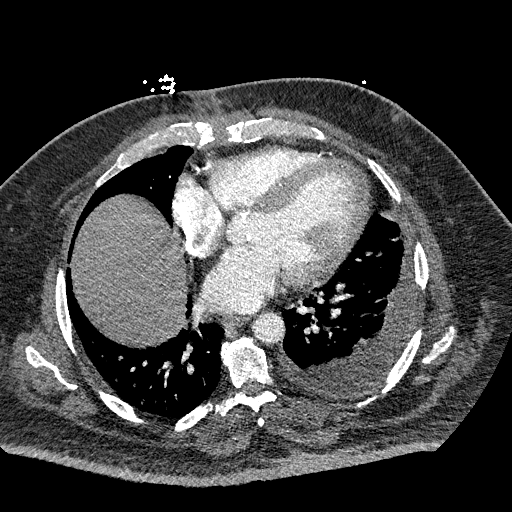
[im 142/251  lung]
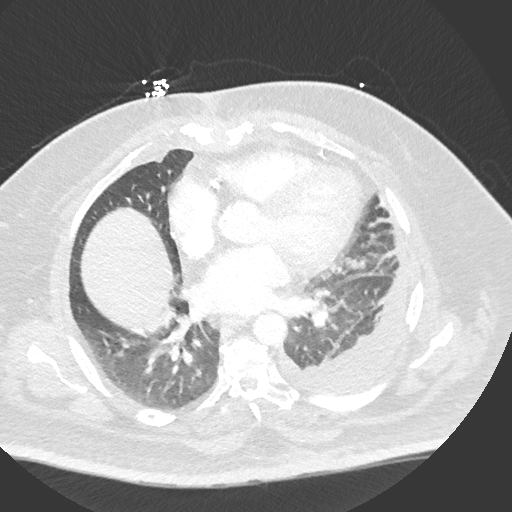
[im 153/251  soft-tissue]
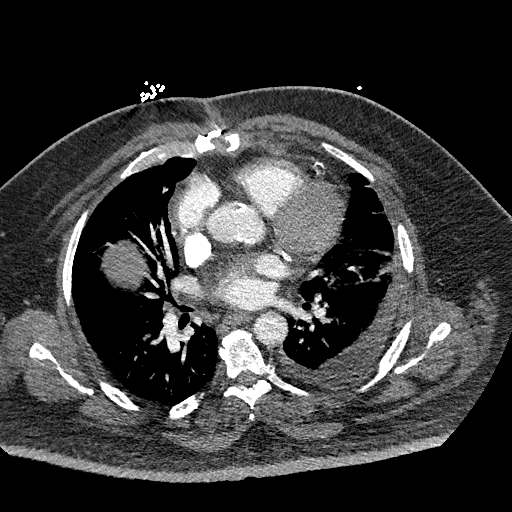
[im 174/251  lung]
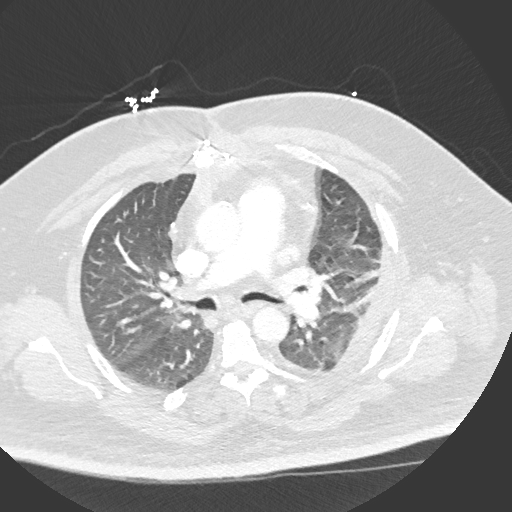
[im 185/251  soft-tissue]
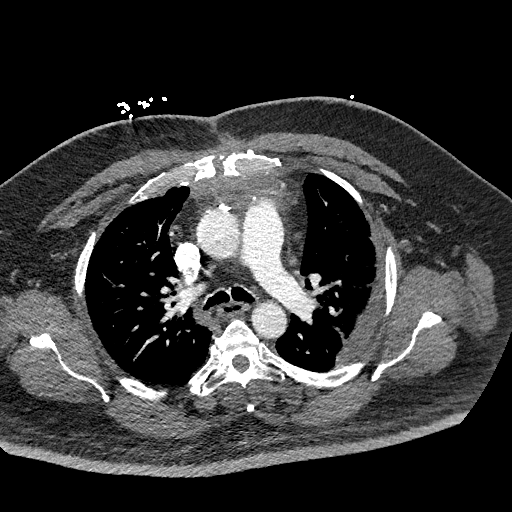
[im 207/251  lung]
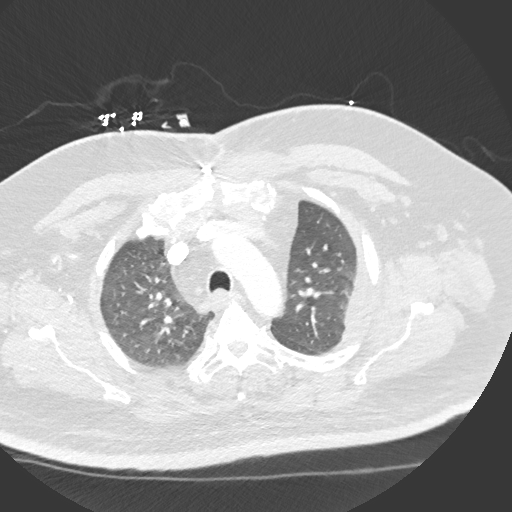
[im 218/251  soft-tissue]
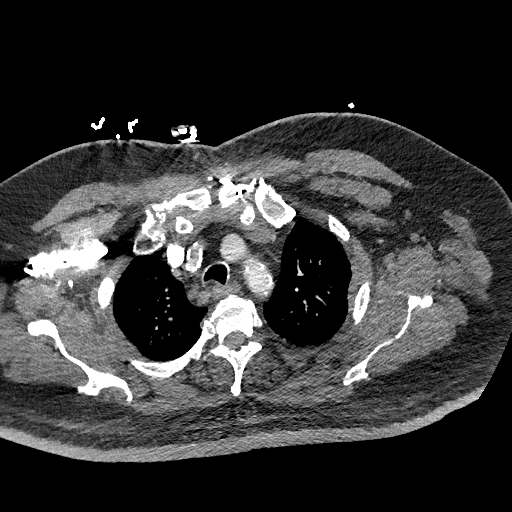
[im 240/251  lung]
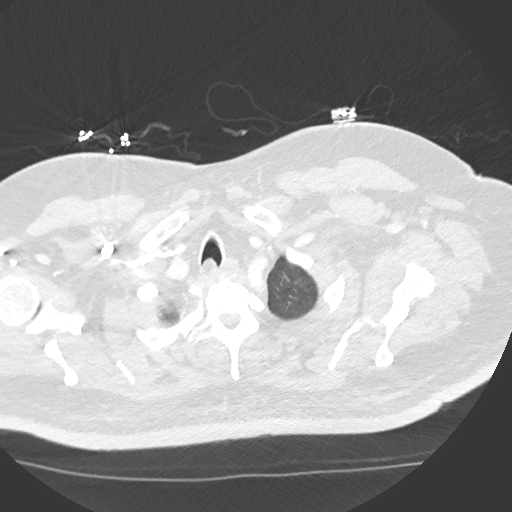

[Series 8: cor soft · coronal · 0.52mm/px · 3 of 151 slices shown]
[im 38/151  soft-tissue]
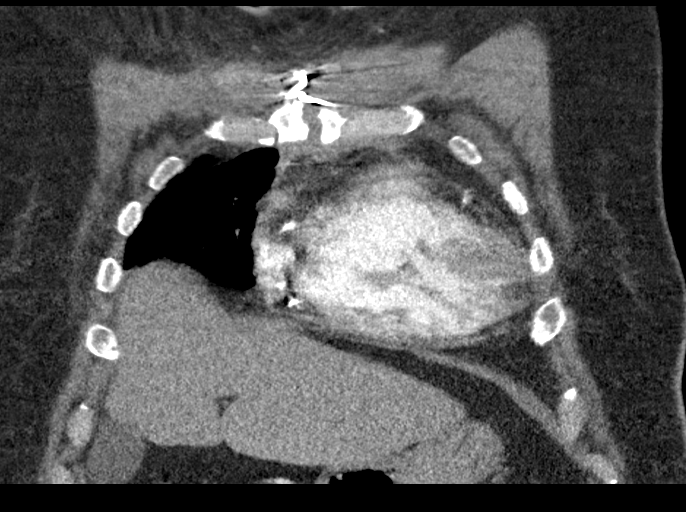
[im 76/151  soft-tissue]
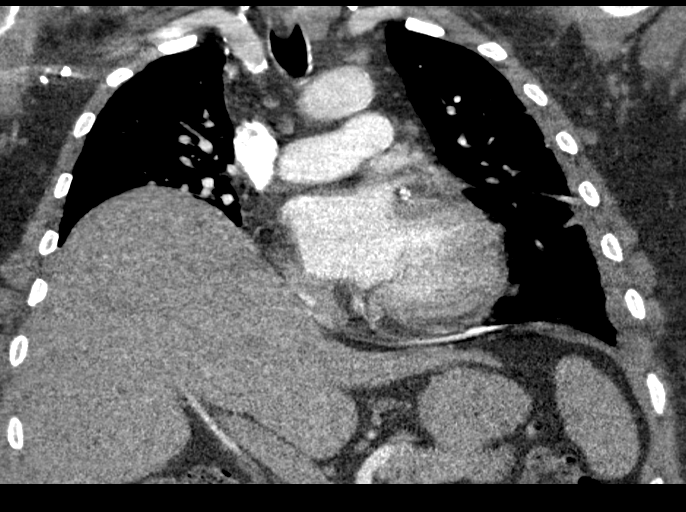
[im 113/151  soft-tissue]
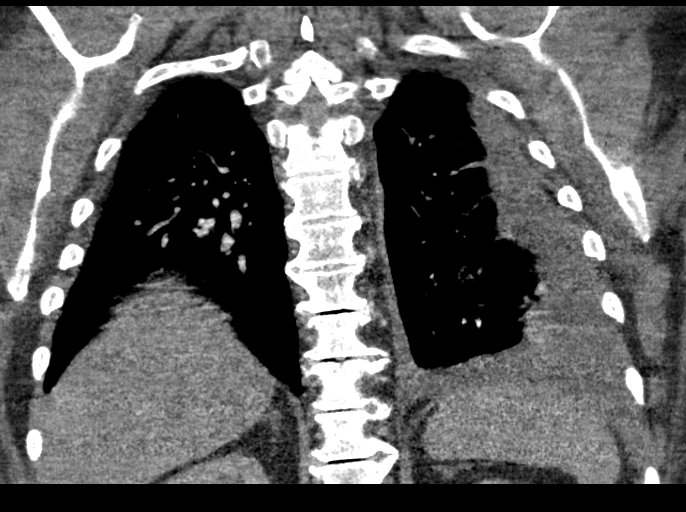

[18 of 46 positions shown; findings below may reference images not displayed]

FINDINGS: Cardiovascular: There is adequate opacification of the pulmonary
arterial tree. No intraluminal filling defect is identified to
suggest acute pulmonary embolism. The central pulmonary arteries are
of normal caliber. There is extensive coronary artery calcification
within the native coronary arteries. Interval coronary artery bypass
grafting has been performed. Infiltration within the anterior
mediastinum and sternal nonunion is in keeping with recent cardiac
surgery. Mild atherosclerotic calcification is seen within the
thoracic aorta which is of normal caliber. Bovine arch anatomy
noted.

Mediastinum/Nodes: No pathologic adenopathy within the thorax.
Esophagus unremarkable.

Lungs/Pleura: Moderate left pleural effusion is present with
compressive atelectasis of the left lung. The right lung is clear.
No pneumothorax. No pleural effusion on the right. The central
airways are widely patent.

Upper Abdomen: No acute abnormality.

Musculoskeletal: No acute bone abnormality.

Review of the MIP images confirms the above findings.
IMPRESSION: Interval coronary artery bypass grafting. Sternotomy fragments
demonstrate nonunion as no bridging callus is yet identified.
Moderate left pleural effusion with associated compressive
atelectasis of the left lung.

No pulmonary embolism.

Aortic Atherosclerosis ([WR]-[WR]).

## 2020-08-01 IMAGING — US US THORACENTESIS ASP PLEURAL SPACE W/IMG GUIDE
1 series · 2 of 2 positions shown · non-contrast
Comparison: none

INDICATION: Left pleural effusion

[Series 1: us thoracentesis asp pleural space w/img guide · 2 of 2 slices shown]
[im 1/2]
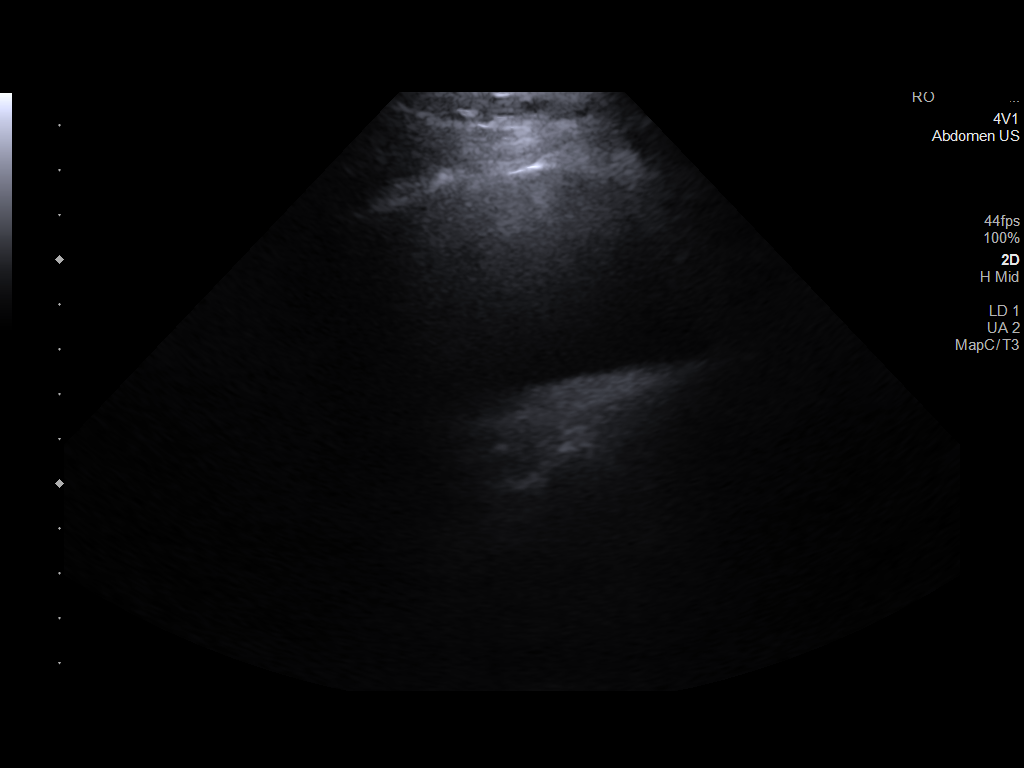
[im 2/2]
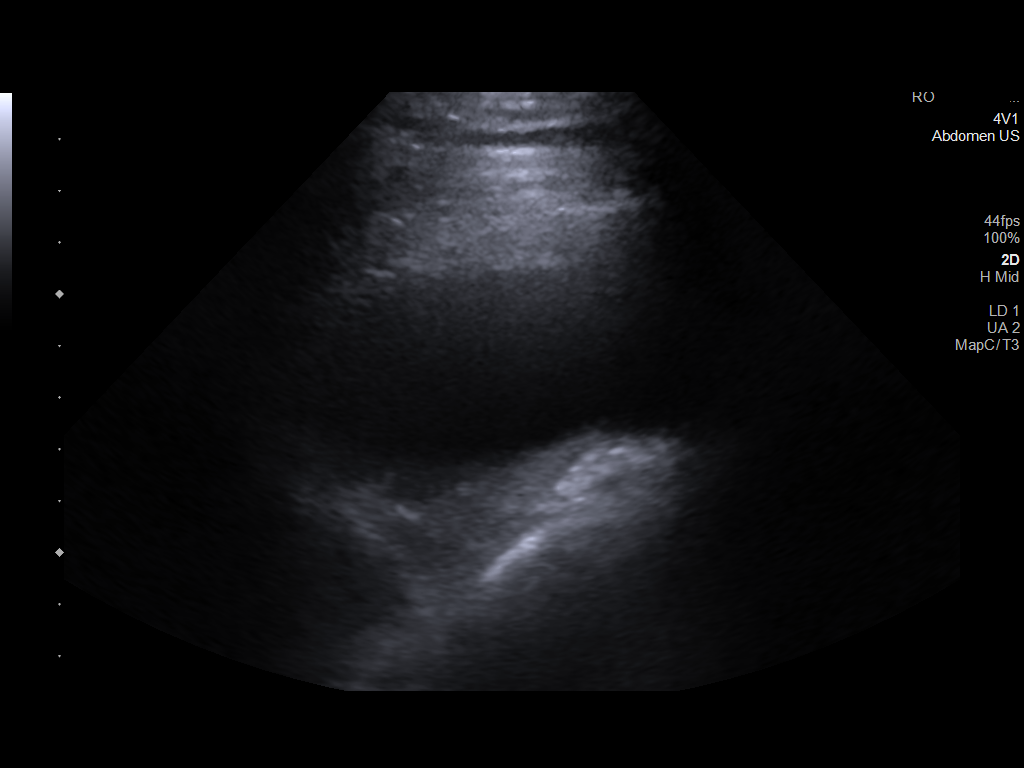

[2 of 2 positions shown; findings below may reference images not displayed]

COPD; CHF

Post CABG [DATE]

EXAM:
ULTRASOUND GUIDED left THORACENTESIS

MEDICATIONS:
10 cc 1% lidocaine.

COMPLICATIONS:
None immediate.

PROCEDURE:
An ultrasound guided thoracentesis was thoroughly discussed with the
patient and questions answered. The benefits, risks, alternatives
and complications were also discussed. The patient understands and
wishes to proceed with the procedure. Written consent was obtained.

Ultrasound was performed to localize and mark an adequate pocket of
fluid in the left chest. The area was then prepped and draped in the
normal sterile fashion. 1% Lidocaine was used for local anesthesia.
Under ultrasound guidance a 19 G Yueh catheter was introduced.
Thoracentesis was performed. The catheter was removed and a dressing
applied.
FINDINGS: A total of approximately 120 cc of bloody fluid was removed. Samples
were sent to the laboratory as requested by the clinical team.
IMPRESSION: Successful ultrasound guided left thoracentesis yielding 120 cc of
pleural fluid.

Read by

EPIFANIA

## 2020-08-01 MED ORDER — ASPIRIN EC 81 MG PO TBEC
81.0000 mg | DELAYED_RELEASE_TABLET | Freq: Every day | ORAL | Status: DC
  Administered 2020-08-01 – 2020-08-07 (×7): 81 mg via ORAL
  Filled 2020-08-01 (×7): qty 1

## 2020-08-01 MED ORDER — VENLAFAXINE HCL ER 75 MG PO CP24
150.0000 mg | ORAL_CAPSULE | Freq: Every day | ORAL | Status: DC
  Administered 2020-08-01 – 2020-08-07 (×7): 150 mg via ORAL
  Filled 2020-08-01 (×6): qty 2
  Filled 2020-08-01: qty 4

## 2020-08-01 MED ORDER — IPRATROPIUM-ALBUTEROL 0.5-2.5 (3) MG/3ML IN SOLN
3.0000 mL | RESPIRATORY_TRACT | Status: DC | PRN
  Administered 2020-08-03 – 2020-08-04 (×2): 3 mL via RESPIRATORY_TRACT
  Filled 2020-08-01 (×4): qty 3

## 2020-08-01 MED ORDER — SODIUM CHLORIDE 0.9 % IV SOLN: 250.0000 mL | INTRAVENOUS | Status: DC | PRN

## 2020-08-01 MED ORDER — ONDANSETRON HCL 4 MG PO TABS: 4.0000 mg | ORAL_TABLET | Freq: Four times a day (QID) | ORAL | Status: DC | PRN

## 2020-08-01 MED ORDER — METHYLPREDNISOLONE SODIUM SUCC 125 MG IJ SOLR
125.0000 mg | Freq: Once | INTRAMUSCULAR | Status: AC
  Administered 2020-08-01: 125 mg via INTRAVENOUS
  Filled 2020-08-01: qty 2

## 2020-08-01 MED ORDER — DOCUSATE SODIUM 100 MG PO CAPS
100.0000 mg | ORAL_CAPSULE | Freq: Two times a day (BID) | ORAL | Status: DC
  Administered 2020-08-01 – 2020-08-07 (×9): 100 mg via ORAL
  Filled 2020-08-01 (×13): qty 1

## 2020-08-01 MED ORDER — ACETAMINOPHEN 650 MG RE SUPP: 650.0000 mg | Freq: Four times a day (QID) | RECTAL | Status: DC | PRN

## 2020-08-01 MED ORDER — FERROUS SULFATE 325 (65 FE) MG PO TABS
325.0000 mg | ORAL_TABLET | ORAL | Status: DC
  Administered 2020-08-01 – 2020-08-07 (×4): 325 mg via ORAL
  Filled 2020-08-01 (×7): qty 1

## 2020-08-01 MED ORDER — ONDANSETRON HCL 4 MG/2ML IJ SOLN: 4.0000 mg | Freq: Four times a day (QID) | INTRAMUSCULAR | Status: DC | PRN

## 2020-08-01 MED ORDER — IOHEXOL 350 MG/ML SOLN
100.0000 mL | Freq: Once | INTRAVENOUS | Status: AC | PRN
  Administered 2020-08-01: 100 mL via INTRAVENOUS

## 2020-08-01 MED ORDER — TRAMADOL HCL 50 MG PO TABS
50.0000 mg | ORAL_TABLET | ORAL | Status: DC | PRN
  Administered 2020-08-01 – 2020-08-05 (×6): 50 mg via ORAL
  Filled 2020-08-01 (×6): qty 1

## 2020-08-01 MED ORDER — MOMETASONE FURO-FORMOTEROL FUM 200-5 MCG/ACT IN AERO
2.0000 | INHALATION_SPRAY | Freq: Two times a day (BID) | RESPIRATORY_TRACT | Status: DC
  Administered 2020-08-01 – 2020-08-07 (×13): 2 via RESPIRATORY_TRACT
  Filled 2020-08-01: qty 8.8

## 2020-08-01 MED ORDER — INSULIN ASPART PROT & ASPART (70-30 MIX) 100 UNIT/ML ~~LOC~~ SUSP
55.0000 [IU] | Freq: Two times a day (BID) | SUBCUTANEOUS | Status: DC
  Administered 2020-08-01 – 2020-08-07 (×13): 55 [IU] via SUBCUTANEOUS
  Filled 2020-08-01: qty 10

## 2020-08-01 MED ORDER — POLYETHYLENE GLYCOL 3350 17 G PO PACK: 17.0000 g | PACK | Freq: Every day | ORAL | Status: DC | PRN

## 2020-08-01 MED ORDER — INFLUENZA VAC SPLIT QUAD 0.5 ML IM SUSY
0.5000 mL | PREFILLED_SYRINGE | INTRAMUSCULAR | Status: AC
  Administered 2020-08-02: 0.5 mL via INTRAMUSCULAR
  Filled 2020-08-01: qty 0.5

## 2020-08-01 MED ORDER — GUAIFENESIN-DM 100-10 MG/5ML PO SYRP: 5.0000 mL | ORAL_SOLUTION | ORAL | Status: DC | PRN

## 2020-08-01 MED ORDER — POTASSIUM CHLORIDE CRYS ER 20 MEQ PO TBCR
20.0000 meq | EXTENDED_RELEASE_TABLET | Freq: Every day | ORAL | Status: DC
  Administered 2020-08-01 – 2020-08-07 (×7): 20 meq via ORAL
  Filled 2020-08-01 (×7): qty 1

## 2020-08-01 MED ORDER — ACETAMINOPHEN 325 MG PO TABS
650.0000 mg | ORAL_TABLET | Freq: Four times a day (QID) | ORAL | Status: DC | PRN
  Administered 2020-08-05: 650 mg via ORAL
  Filled 2020-08-01: qty 2

## 2020-08-01 MED ORDER — ALUM & MAG HYDROXIDE-SIMETH 200-200-20 MG/5ML PO SUSP: 30.0000 mL | ORAL | Status: DC | PRN

## 2020-08-01 MED ORDER — INSULIN ASPART 100 UNIT/ML ~~LOC~~ SOLN
0.0000 [IU] | Freq: Three times a day (TID) | SUBCUTANEOUS | Status: DC
  Administered 2020-08-01: 3 [IU] via SUBCUTANEOUS
  Administered 2020-08-01 (×2): 8 [IU] via SUBCUTANEOUS
  Administered 2020-08-02: 2 [IU] via SUBCUTANEOUS
  Administered 2020-08-02 (×2): 3 [IU] via SUBCUTANEOUS
  Administered 2020-08-03: 2 [IU] via SUBCUTANEOUS
  Administered 2020-08-03 – 2020-08-04 (×3): 3 [IU] via SUBCUTANEOUS
  Administered 2020-08-05: 5 [IU] via SUBCUTANEOUS
  Administered 2020-08-06: 3 [IU] via SUBCUTANEOUS
  Administered 2020-08-07: 2 [IU] via SUBCUTANEOUS
  Administered 2020-08-07: 3 [IU] via SUBCUTANEOUS
  Filled 2020-08-01 (×3): qty 1

## 2020-08-01 MED ORDER — FUROSEMIDE 10 MG/ML IJ SOLN
40.0000 mg | Freq: Two times a day (BID) | INTRAMUSCULAR | Status: DC
  Administered 2020-08-01 – 2020-08-05 (×9): 40 mg via INTRAVENOUS
  Filled 2020-08-01 (×9): qty 4

## 2020-08-01 MED ORDER — ALBUTEROL SULFATE HFA 108 (90 BASE) MCG/ACT IN AERS
4.0000 | INHALATION_SPRAY | Freq: Once | RESPIRATORY_TRACT | Status: AC
  Administered 2020-08-01: 4 via RESPIRATORY_TRACT
  Filled 2020-08-01: qty 6.7

## 2020-08-01 MED ORDER — GABAPENTIN 300 MG PO CAPS
900.0000 mg | ORAL_CAPSULE | Freq: Two times a day (BID) | ORAL | Status: DC
  Administered 2020-08-01 – 2020-08-07 (×13): 900 mg via ORAL
  Filled 2020-08-01 (×13): qty 3

## 2020-08-01 MED ORDER — SODIUM CHLORIDE 0.9% FLUSH
3.0000 mL | Freq: Two times a day (BID) | INTRAVENOUS | Status: DC
  Administered 2020-08-01 – 2020-08-07 (×11): 3 mL via INTRAVENOUS

## 2020-08-01 MED ORDER — SODIUM CHLORIDE 0.9% FLUSH: 3.0000 mL | INTRAVENOUS | Status: DC | PRN

## 2020-08-01 MED ORDER — ATORVASTATIN CALCIUM 40 MG PO TABS
80.0000 mg | ORAL_TABLET | Freq: Every day | ORAL | Status: DC
  Administered 2020-08-01: 80 mg via ORAL
  Filled 2020-08-01: qty 2

## 2020-08-01 MED ORDER — INSULIN ASPART 100 UNIT/ML ~~LOC~~ SOLN
3.0000 [IU] | Freq: Three times a day (TID) | SUBCUTANEOUS | Status: DC
  Administered 2020-08-01 – 2020-08-07 (×19): 3 [IU] via SUBCUTANEOUS
  Filled 2020-08-01 (×3): qty 1

## 2020-08-01 MED ORDER — PANTOPRAZOLE SODIUM 40 MG PO TBEC
40.0000 mg | DELAYED_RELEASE_TABLET | Freq: Every day | ORAL | Status: DC
  Administered 2020-08-01 – 2020-08-07 (×7): 40 mg via ORAL
  Filled 2020-08-01 (×7): qty 1

## 2020-08-01 MED ORDER — FUROSEMIDE 40 MG PO TABS: 80.0000 mg | ORAL_TABLET | Freq: Two times a day (BID) | ORAL | Status: DC

## 2020-08-01 MED ORDER — HEPARIN SODIUM (PORCINE) 5000 UNIT/ML IJ SOLN
5000.0000 [IU] | Freq: Three times a day (TID) | INTRAMUSCULAR | Status: DC
  Administered 2020-08-01 – 2020-08-07 (×16): 5000 [IU] via SUBCUTANEOUS
  Filled 2020-08-01 (×18): qty 1

## 2020-08-01 MED ORDER — INSULIN ASPART 100 UNIT/ML ~~LOC~~ SOLN: 0.0000 [IU] | Freq: Every day | SUBCUTANEOUS | Status: DC

## 2020-08-01 MED ORDER — FUROSEMIDE 10 MG/ML IJ SOLN
80.0000 mg | Freq: Once | INTRAMUSCULAR | Status: AC
  Administered 2020-08-01: 80 mg via INTRAVENOUS
  Filled 2020-08-01: qty 8

## 2020-08-01 MED ORDER — METOPROLOL SUCCINATE ER 25 MG PO TB24
25.0000 mg | ORAL_TABLET | Freq: Two times a day (BID) | ORAL | Status: DC
  Administered 2020-08-01 – 2020-08-07 (×13): 25 mg via ORAL
  Filled 2020-08-01 (×13): qty 1

## 2020-08-01 MED ORDER — CLOPIDOGREL BISULFATE 75 MG PO TABS
75.0000 mg | ORAL_TABLET | Freq: Every day | ORAL | Status: DC
  Administered 2020-08-01 – 2020-08-07 (×7): 75 mg via ORAL
  Filled 2020-08-01 (×7): qty 1

## 2020-08-01 MED ORDER — CHLORHEXIDINE GLUCONATE CLOTH 2 % EX PADS
6.0000 | MEDICATED_PAD | Freq: Every day | CUTANEOUS | Status: DC
  Administered 2020-08-01 – 2020-08-05 (×5): 6 via TOPICAL

## 2020-08-01 NOTE — ED Notes (Signed)
Pt placed on 2 LPM [place]

## 2020-08-01 NOTE — H&P (Addendum)
History and Physical    Shane Chambers [facility]MC:[id number] DOB: 1961-03-05 DOA: 08/01/2020  PCP: [name], MD   Patient coming from: Home  Chief Complaint: Shortness of breath  HPI: Shane Chambers is a 59 y.o. male with medical history significant for CAD with CABG on 04/6293, grade 2 diastolic CHF with LVEF 76-54% on echo 04/2020, hypertension, dyslipidemia, type 2 diabetes, GERD, BPH, obesity, mood disorder, and iron deficiency anemia who presented to the ED with worsening shortness of breath.  He states that his shortness of breath has worsened over the past week and has had swelling to his bilateral lower extremities.  He denies any chest pain, fevers, or chills.  He states that his baseline weight is typically 280 pounds and he has come up to approximately 298 pounds.  He does have some orthopnea noted and also a dry cough, but no wheezing or chest tightness.  He states that he has some baseline shortness of breath with exertion that is not unusual for him.  He does not wear a CPAP mask at night or any other oxygen supplementation.  He has recently completed both of his Covid vaccinations within the last 2 weeks. Mother on phone states that patient has been drinking quite a bit of fluid.  Patient also states that he tends to eat a significant amount of boxed/processed foods.  He is apparently very compliant with his Lasix use and states that he may have missed 1 day in the last 2 weeks.   ED Course: Vital signs with soft bp readings noted and laboratory data remarkable for BNP of 141 and some leukocytosis of 16,700.  Troponin noted to be initially 11 and then 9.  EKG with no acute abnormalities noted.  Covid testing negative.  CT chest with PE study negative.  Moderate left pleural effusion noted with compressive atelectasis.  Patient has been given some Lasix 80 mg IV in the ED with some diuresis noted he was also received methylprednisolone as well as a breathing treatment.  Review of Systems:  All others reviewed and otherwise negative except noted above.  Past Medical History:  Diagnosis Date  . CAD (coronary artery disease)    a. s/p CABG in 03/2020 with LIMA-LAD, RIMA-PL, RA-D1-RI-OM1  . Depression   . Diabetes mellitus without complication ([place])   . Hyperlipidemia 12/01/2019  . Hypertension   . Ischemic cardiomyopathy    a. EF 20-25% by echo in 02/2020 b. at 40% by echo in 03/2020 c. EF normalized to 60-65% by echo in 04/2020  . Type 2 diabetes mellitus ([place])     Past Surgical History:  Procedure Laterality Date  . CORONARY ARTERY BYPASS GRAFT N/A 03/13/2020   Procedure: CORONARY ARTERY BYPASS GRAFTING (CABG) times five using bilateral Internal mammary arteries and left radial artery;  Surgeon: [name], MD;  Location: [facility];  Service: Open Heart Surgery;  Laterality: N/A;  . DENTAL SURGERY    . RADIAL ARTERY HARVEST Left 03/13/2020   Procedure: RADIAL ARTERY HARVEST,;  Surgeon: [name], MD;  Location: [facility];  Service: Open Heart Surgery;  Laterality: Left;  . RIGHT/LEFT HEART CATH AND CORONARY ANGIOGRAPHY N/A 03/07/2020   Procedure: RIGHT/LEFT HEART CATH AND CORONARY ANGIOGRAPHY;  Surgeon: Sherren Mocha, MD;  Location: [facility];  Service: Cardiovascular;  Laterality: N/A;  . TEE WITHOUT CARDIOVERSION N/A 03/13/2020   Procedure: TRANSESOPHAGEAL ECHOCARDIOGRAM (TEE);  Surgeon: [name], MD;  Location: [facility];  Service: Open Heart Surgery;  Laterality: N/A;     reports that he quit smoking about 25 years ago. His smoking use included cigarettes. He smoked 1.50 packs per day. He has never used smokeless tobacco. He reports current alcohol use. He reports that he does not use drugs.  Allergies  Allergen Reactions  . Ace Inhibitors Swelling and Cough  . Other Itching    [name]  . Penicillins     Has patient had a PCN reaction causing immediate rash, facial/tongue/throat swelling, SOB or lightheadedness with hypotension: Unknown Has  patient had a PCN reaction causing severe rash involving mucus membranes or skin necrosis: Unknown Has patient had a PCN reaction that required hospitalization: Unknown Has patient had a PCN reaction occurring within the last 10 years: Unknown If all of the above answers are "NO", then may proceed with Cephalosporin use.     Family History  Problem Relation Age of Onset  . Hypertension Mother     Prior to Admission medications   Medication Sig Start Date End Date Taking? Authorizing Provider  albuterol (VENTOLIN HFA) 108 (90 Base) MCG/ACT inhaler Inhale 1-2 puffs into the lungs every 6 (six) hours as needed for wheezing or shortness of breath. 05/24/20   Rigoberto Noel, MD  alum & mag hydroxide-simeth (MAALOX PLUS) 400-400-40 MG/5ML suspension Take 30 mLs by mouth as needed for indigestion.    [provider]  aspirin 81 MG EC tablet Take 81 mg by mouth daily.  03/10/16   [provider]  atorvastatin (LIPITOR) 80 MG tablet Take 1 tablet (80 mg total) by mouth at bedtime. 03/19/20   Barrett, Erin R, PA-C  azithromycin (ZITHROMAX) 250 MG tablet Take 1 tablet (250 mg total) by mouth daily. Take first 2 tablets together, then 1 every day until finished. 06/03/20   Orpah Greek, MD  budesonide-formoterol (SYMBICORT) 160-4.5 MCG/ACT inhaler Inhale 2 puffs into the lungs in the morning and at bedtime. 05/24/20   Rigoberto Noel, MD  calcium carbonate (OS-CAL - DOSED IN MG OF ELEMENTAL CALCIUM) 1250 (500 Ca) MG tablet Take 1 tablet by mouth daily.    [provider]  cephALEXin (KEFLEX) 500 MG capsule Take 1 capsule (500 mg total) by mouth 4 (four) times daily. 06/04/20   Recardo Evangelist, PA-C  clopidogrel (PLAVIX) 75 MG tablet Take 1 tablet (75 mg total) by mouth daily. 03/20/20   Barrett, Erin R, PA-C  docusate sodium (COLACE) 100 MG capsule Take 1 capsule (100 mg total) by mouth 2 (two) times daily. 12/25/19   Manuella Ghazi, Jamail Cullers D, DO  ferrous sulfate 325 (65 FE) MG tablet  Take 1 tablet (325 mg total) by mouth every other day. 05/03/20   Shahmehdi, Valeria Batman, MD  fluticasone (VERAMYST) 27.5 MCG/SPRAY nasal spray Place 2 sprays into the nose daily as needed for rhinitis or allergies.     [provider]  furosemide (LASIX) 80 MG tablet Take 1 tablet (80 mg total) by mouth 2 (two) times daily. 05/22/20   Strader, Fransisco Hertz, PA-C  gabapentin (NEURONTIN) 300 MG capsule Take 900 mg by mouth 2 (two) times daily.    [provider]  guaiFENesin-dextromethorphan (ROBITUSSIN DM) 100-10 MG/5ML syrup Take 5 mLs by mouth every 4 (four) hours as needed for cough. 05/03/20   Shahmehdi, Valeria Batman, MD  insulin aspart (NOVOLOG) 100 UNIT/ML injection Inject 0-20 Units into the skin 3 (three) times daily with meals. 05/03/20   Shahmehdi, Valeria Batman, MD  insulin aspart (NOVOLOG) 100 UNIT/ML injection Inject  0-5 Units into the skin at bedtime. 05/03/20   Shahmehdi, Valeria Batman, MD  insulin aspart protamine- aspart (NOVOLOG MIX 70/30) (70-30) 100 UNIT/ML injection Inject 0.55 mLs (55 Units total) into the skin 2 (two) times daily with a meal. 05/03/20   Shahmehdi, Seyed A, MD  ketoconazole (NIZORAL) 2 % cream Apply 1 application topically daily. Apply twice daily for 2 weeks to the ears 06/04/20   Recardo Evangelist, PA-C  losartan (COZAAR) 25 MG tablet Take 0.5 tablets (12.5 mg total) by mouth daily. Patient taking differently: Take 25 mg by mouth daily.  05/03/20   Shahmehdi, Valeria Batman, MD  MAGNESIUM PO Take 400 mg by mouth daily.     [provider]  metFORMIN (GLUCOPHAGE) 1000 MG tablet Take 1,000 mg by mouth 2 (two) times daily with a meal.    [provider]  metoprolol tartrate (LOPRESSOR) 25 MG tablet Take 1 tablet (25 mg total) by mouth 2 (two) times daily. 03/19/20   Barrett, Erin R, PA-C  Multiple Vitamins-Minerals (TAB-A-VITE) TABS Take 1 tablet by mouth daily. 07/27/19   [provider]  omeprazole (PRILOSEC) 40 MG capsule Take 40 mg by mouth daily.     [provider]  polyethylene glycol (MIRALAX / GLYCOLAX) 17 g packet Take 17 g by mouth daily as needed for mild constipation. 12/25/19   Manuella Ghazi, Joe Tanney D, DO  potassium chloride SA (KLOR-CON) 20 MEQ tablet Take 1 tablet (20 mEq total) by mouth daily. 05/22/20   Strader, Fransisco Hertz, PA-C  tamsulosin (FLOMAX) 0.4 MG CAPS capsule Take 0.4 mg by mouth daily.    [provider]  traMADol (ULTRAM) 50 MG tablet Take 1-2 tablets (50-100 mg total) by mouth every 4 (four) hours as needed for moderate pain. 03/19/20   Barrett, Erin R, PA-C  venlafaxine XR (EFFEXOR-XR) 150 MG 24 hr capsule Take 150 mg by mouth daily with breakfast.    [provider]    Physical Exam: Vitals:   08/01/20 0430 08/01/20 0500 08/01/20 0634 08/01/20 0636  BP: (!) 124/51 134/76 (!) 110/47   Pulse: 80 88 91 91  Resp: (!) 27 (!) 26 (!) 22 (!) 25  Temp:      TempSrc:      SpO2: 93% 95% 93% 98%    Constitutional: NAD, calm, comfortable, obese Vitals:   08/01/20 0430 08/01/20 0500 08/01/20 0634 08/01/20 0636  BP: (!) 124/51 134/76 (!) 110/47   Pulse: 80 88 91 91  Resp: (!) 27 (!) 26 (!) 22 (!) 25  Temp:      TempSrc:      SpO2: 93% 95% 93% 98%   Eyes: lids and conjunctivae normal ENMT: Mucous membranes are moist.  Neck: normal, supple Respiratory: Diminished breath sounds bilaterally. Normal respiratory effort. No accessory muscle use.  Currently on 2 L nasal cannula oxygen. Cardiovascular: Regular rate and rhythm, no murmurs. No extremity edema. Abdomen: no tenderness, no distention. Bowel sounds positive.  Musculoskeletal:  No joint deformity upper and lower extremities.   Skin: no rashes, lesions, ulcers.  Psychiatric: Normal judgment and insight. Alert and oriented x 3. Normal mood.   Labs on Admission: I have personally reviewed following labs and imaging studies  CBC: Recent Labs  Lab 08/01/20 0346  WBC 16.7*  NEUTROABS 11.2*  HGB 10.9*  HCT 36.4*  MCV 86.1  PLT 471*   Basic  Metabolic Panel: Recent Labs  Lab 08/01/20 0346  NA 137  K 3.9  CL 93*  CO2 28  GLUCOSE 74  BUN 23*  CREATININE 1.10  CALCIUM 9.6   GFR: CrCl cannot be calculated (Unknown ideal weight.). Liver Function Tests: Recent Labs  Lab 08/01/20 0346  AST 27  ALT 20  ALKPHOS 108  BILITOT 0.7  PROT 7.9  ALBUMIN 3.8   No results for input(s): LIPASE, AMYLASE in the last 168 hours. No results for input(s): AMMONIA in the last 168 hours. Coagulation Profile: No results for input(s): INR, PROTIME in the last 168 hours. Cardiac Enzymes: No results for input(s): CKTOTAL, CKMB, CKMBINDEX, TROPONINI in the last 168 hours. BNP (last 3 results) No results for input(s): PROBNP in the last 8760 hours. HbA1C: No results for input(s): HGBA1C in the last 72 hours. CBG: No results for input(s): GLUCAP in the last 168 hours. Lipid Profile: No results for input(s): CHOL, HDL, LDLCALC, TRIG, CHOLHDL, LDLDIRECT in the last 72 hours. Thyroid Function Tests: No results for input(s): TSH, T4TOTAL, FREET4, T3FREE, THYROIDAB in the last 72 hours. Anemia Panel: No results for input(s): VITAMINB12, FOLATE, FERRITIN, TIBC, IRON, RETICCTPCT in the last 72 hours. Urine analysis:    Component Value Date/Time   COLORURINE YELLOW 03/05/2020 1251   APPEARANCEUR CLEAR 03/05/2020 1251   LABSPEC 1.014 03/05/2020 1251   PHURINE 7.0 03/05/2020 1251   GLUCOSEU >=500 (A) 03/05/2020 1251   HGBUR NEGATIVE 03/05/2020 1251   BILIRUBINUR NEGATIVE 03/05/2020 1251   KETONESUR NEGATIVE 03/05/2020 1251   PROTEINUR 100 (A) 03/05/2020 1251   NITRITE NEGATIVE 03/05/2020 1251   LEUKOCYTESUR NEGATIVE 03/05/2020 1251    Radiological Exams on Admission: CT Angio Chest PE W and/or Wo Contrast  Result Date: 08/01/2020 CLINICAL DATA:  Dyspnea EXAM: CT ANGIOGRAPHY CHEST WITH CONTRAST TECHNIQUE: Multidetector CT imaging of the chest was performed using the standard protocol during bolus administration of intravenous contrast.  Multiplanar CT image reconstructions and MIPs were obtained to evaluate the vascular anatomy. CONTRAST:  159mL OMNIPAQUE IOHEXOL 350 MG/ML SOLN COMPARISON:  03/05/2020 FINDINGS: Cardiovascular: There is adequate opacification of the pulmonary arterial tree. No intraluminal filling defect is identified to suggest acute pulmonary embolism. The central pulmonary arteries are of normal caliber. There is extensive coronary artery calcification within the native coronary arteries. Interval coronary artery bypass grafting has been performed. 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 Mild atherosclerotic calcification is seen within the thoracic aorta which is of normal caliber. Bovine arch anatomy noted. Mediastinum/Nodes: No pathologic adenopathy within the thorax. Esophagus unremarkable. Lungs/Pleura: Moderate left pleural effusion is present with compressive atelectasis of the left lung. The right lung is clear. No pneumothorax. No pleural effusion on the right. The central airways are widely patent. Upper Abdomen: No acute abnormality. Musculoskeletal: No acute bone abnormality. Review of the MIP images confirms the above findings. IMPRESSION: Interval coronary artery bypass grafting. Sternotomy fragments demonstrate nonunion as no bridging callus is yet identified. Moderate left pleural effusion with associated compressive atelectasis of the left lung. No pulmonary embolism. Aortic Atherosclerosis (ICD10-I70.0). Electronically Signed   By: Fidela Salisbury MD   On: 08/01/2020 05:32   DG Chest Port 1 View  Result Date: 08/01/2020 CLINICAL DATA:  Dyspnea EXAM: PORTABLE CHEST 1 VIEW COMPARISON:  05/18/2020 FINDINGS: The patient is quite lordotic on this semi upright chest radiograph. Small left pleural effusion is again identified and has slightly increased in size since prior examination. Lungs are otherwise clear. No pneumothorax. No pleural effusion  on the right. Median sternotomy has been performed. Cardiac size is likely  stable when accounting for altered patient positioning. Pulmonary vascularity is normal. IMPRESSION: Enlarging, small left pleural effusion. Electronically Signed   By: Fidela Salisbury MD   On: 08/01/2020 04:25    EKG: Independently reviewed. SR 82bpm.  Assessment/Plan Active Problems:   CHF exacerbation (HCC)    Acute hypoxemic respiratory failure secondary to acute on chronic diastolic congestive heart failure with moderate left pleural effusion -Likely related to dietary/fluid restriction noncompliance -Recent 2D echocardiogram on 04/2020 with LVEF 60-65% and grade 2 diastolic CHF -Noted to have some soft blood pressure readings in ED, monitor in stepdown unit -Compressive atelectasis to left lung noted secondary to the effusion, plan for thoracentesis which will be ordered -Continue home oral Lasix dose for now at 80 mg twice daily given softer blood pressure readings-appreciate cardiology adjustment based on evaluation -Fluid restriction, strict input and output, daily weights -Wean oxygen as tolerated  Questionable COPD -Being evaluated by pulmonology, Dr. Elsworth Soho in outpatient setting -Prior history of tobacco abuse -Plan to order breathing treatments as needed -No significant bronchospasms currently noted, therefore we will avoid steroids especially in setting of diabetes  CAD with prior CABG 03/2020 -Continue dual antiplatelet therapy with aspirin and Plavix -Continue statin -Hold beta-blocker given soft blood pressure readings -No sign of ACS currently noted  Type 2 diabetes with polyneuropathy -Hold home Metformin -Continue home 70/30 insulin -Diabetic diet -SSI -Hold Metformin  History of hypertension-currently with mild hypotension -Plan to hold losartan and metoprolol given soft blood pressure readings -Monitor carefully while on home Lasix dose oral for now  Iron deficiency anemia -Currently  appears stable -Continue monitoring -Continue home iron supplementation and stool softener -No overt bleeding noted  Mood disorder -Continue venlafaxine  BPH -Continue Flomax  GERD -Continue PPI  Obesity with possible OSA -Lifestyle changes -He still needs sleep study outpatient and does not wear CPAP at night   DVT prophylaxis: Heparin Code Status: Full Family Communication: Called mother on phone 9/22 Disposition Plan:Admit for treatment of CHF Consults called:Cardiology Admission status: Inpatient, Tele   Leilany Digeronimo D Niam Nepomuceno DO [facility]  If 7PM-7AM, please contact night-coverage [email or web address]  08/01/2020, 7:13 AM

## 2020-08-01 NOTE — Procedures (Addendum)
   Post CABG 03/2020 COPD; CHF  Left pleural effusion  US guided L thoracentesis 120 cc bloody fluid  Sent for labs per MD  Tolerated well EBL: minimal CXR: No PTX

## 2020-08-01 NOTE — Sedation Documentation (Signed)
US guided L thoracentesis procedure tolerated well. Chest xray obtained and pt transported back to ED room 18 at this time with NAD.   120 cc bloody fluid collected from thoracentesis and sent to lab.

## 2020-08-01 NOTE — ED Triage Notes (Signed)
Pt reports SHOB that started 3 days ago. Pt denies pain. Denies fevers.

## 2020-08-01 NOTE — Consult Note (Addendum)
Cardiology Consult    Patient ID: Shane Chambers; [id number]; 27-Sep-1961   Admit date: 08/01/2020 Date of Consult: 08/01/2020  Primary Care Provider: [name], MD Primary Cardiologist: Carlyle Dolly, MD   Patient Profile    Shane Chambers is a 59 y.o. male with past medical history of CAD (s/p CABG in 03/2020 with LIMA-LAD, RIMA-PL, [id number]), chronic systolic CHF (EF 78-29% by echo in 02/2020, at 40% by echo in 03/2020, normalized to 60-65% by echo in 04/2020), HTN, HLD, Type 2 DM and OSA who is being seen today for the evaluation of CHF at the request of Dr. Manuella Ghazi.   History of Present Illness    Shane Chambers was last examined by myself in 05/2020 following a recent hospitalization for acute on chronic hypoxic respiratory failure in the setting of COPD, PNA and a CHF exacerbation.  Repeat echocardiogram during that admission showed his EF had normalized to 60 to 65% and weight at the time of discharge been 279 lbs.  At the time of his visit, he reported having a fall and had experienced chest discomfort as he landed on his chest but denied any exertional chest pain. He was using a walker for ambulation. He was listed as taking Lasix 40 mg daily but reported taking 80mg  in AM/60mg  in PM.  He did appear volume overloaded on examination and BNP was minimally elevated at 144, therefore Lasix was increased to 80 mg twice daily. He was suppose to have a repeat BMET in 2 weeks but this was never obtained.  He presented to [facility] ED this morning for evaluation of worsening dyspnea. In talking with the patient today, he reports worsening dyspnea on exertion with minimal activity over the past several weeks. He is mostly homebound and uses a walker to ambulate from one room to the other. Reports getting short of breath with walking a minimal distance. Denies any associated chest pain or palpitations. He has experienced worsening orthopnea and lower extremity edema. Says that his weight had  increased up to 298 lbs on his home scales. His mom fixes most of his meals and he reports they consume frozen dinners on a regular basis. He does not add additional salt to his food. He reports good compliance with Lasix 80 mg twice daily and denies missing any recent doses.  Initial labs show WBC 16.7, Hgb 10.9, platelets 471, Na+ 137, K+ 3.9 and creatinine 1.10. Initial and delta HS Troponin values negative at 11 and 9. BNP 141. CXR shows a enlarging small left pleural effusion. 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 Was noted to have a moderate left pleural effusion but no evidence of a PE. EKG shows normal sinus rhythm, heart rate 82 with inferior infarct pattern. No acute ST changes when compared to prior tracings.  He received IV Lasix 80 mg at 0400 with a recorded output of -2.0L thus far. Also scheduled for a thoracentesis later today.   Past Medical History:  Diagnosis Date  . CAD (coronary artery disease)    a. s/p CABG in 03/2020 with LIMA-LAD, RIMA-PL, RA-D1-RI-OM1  . Depression   . Diabetes mellitus without complication ([place])   . Hyperlipidemia 12/01/2019  . Hypertension   . Ischemic cardiomyopathy    a. EF 20-25% by echo in 02/2020 b. at 40% by echo in 03/2020 c. EF normalized to 60-65% by echo in 04/2020  . Type 2 diabetes mellitus ([place])  Past Surgical History:  Procedure Laterality Date  . CORONARY ARTERY BYPASS GRAFT N/A 03/13/2020   Procedure: CORONARY ARTERY BYPASS GRAFTING (CABG) times five using bilateral Internal mammary arteries and left radial artery;  Surgeon: [name], MD;  Location: [place];  Service: Open Heart Surgery;  Laterality: N/A;  . DENTAL SURGERY    . RADIAL ARTERY HARVEST Left 03/13/2020   Procedure: RADIAL ARTERY HARVEST,;  Surgeon: [name], MD;  Location: [place];  Service: Open Heart Surgery;  Laterality: Left;  . RIGHT/LEFT HEART CATH AND CORONARY ANGIOGRAPHY N/A 03/07/2020     Procedure: RIGHT/LEFT HEART CATH AND CORONARY ANGIOGRAPHY;  Surgeon: Sherren Mocha, MD;  Location: [place] [facility];  Service: Cardiovascular;  Laterality: N/A;  . TEE WITHOUT CARDIOVERSION N/A 03/13/2020   Procedure: TRANSESOPHAGEAL ECHOCARDIOGRAM (TEE);  Surgeon: [name], MD;  Location: [place];  Service: Open Heart Surgery;  Laterality: N/A;     Home Medications:  Prior to Admission medications   Medication Sig Start Date End Date Taking? Authorizing Provider  albuterol (VENTOLIN HFA) 108 (90 Base) MCG/ACT inhaler Inhale 1-2 puffs into the lungs every 6 (six) hours as needed for wheezing or shortness of breath. 05/24/20   Rigoberto Noel, MD  alum & mag hydroxide-simeth (MAALOX PLUS) 400-400-40 MG/5ML suspension Take 30 mLs by mouth as needed for indigestion.    [provider]  aspirin 81 MG EC tablet Take 81 mg by mouth daily.  03/10/16   [provider]  atorvastatin (LIPITOR) 80 MG tablet Take 1 tablet (80 mg total) by mouth at bedtime. 03/19/20   Barrett, Erin R, PA-C  azithromycin (ZITHROMAX) 250 MG tablet Take 1 tablet (250 mg total) by mouth daily. Take first 2 tablets together, then 1 every day until finished. 06/03/20   Orpah Greek, MD  budesonide-formoterol (SYMBICORT) 160-4.5 MCG/ACT inhaler Inhale 2 puffs into the lungs in the morning and at bedtime. 05/24/20   Rigoberto Noel, MD  calcium carbonate (OS-CAL - DOSED IN MG OF ELEMENTAL CALCIUM) 1250 (500 Ca) MG tablet Take 1 tablet by mouth daily.    [provider]  cephALEXin (KEFLEX) 500 MG capsule Take 1 capsule (500 mg total) by mouth 4 (four) times daily. 06/04/20   Recardo Evangelist, PA-C  clopidogrel (PLAVIX) 75 MG tablet Take 1 tablet (75 mg total) by mouth daily. 03/20/20   Barrett, Erin R, PA-C  docusate sodium (COLACE) 100 MG capsule Take 1 capsule (100 mg total) by mouth 2 (two) times daily. 12/25/19   Manuella Ghazi, Pratik D, DO  ferrous sulfate 325 (65 FE) MG tablet Take 1 tablet (325 mg  total) by mouth every other day. 05/03/20   Shahmehdi, Valeria Batman, MD  fluticasone (VERAMYST) 27.5 MCG/SPRAY nasal spray Place 2 sprays into the nose daily as needed for rhinitis or allergies.     [provider]  furosemide (LASIX) 80 MG tablet Take 1 tablet (80 mg total) by mouth 2 (two) times daily. 05/22/20   Strader, Fransisco Hertz, PA-C  gabapentin (NEURONTIN) 300 MG capsule Take 900 mg by mouth 2 (two) times daily.    [provider]  guaiFENesin-dextromethorphan (ROBITUSSIN DM) 100-10 MG/5ML syrup Take 5 mLs by mouth every 4 (four) hours as needed for cough. 05/03/20   Shahmehdi, Valeria Batman, MD  insulin aspart (NOVOLOG) 100 UNIT/ML injection Inject 0-20 Units into the skin 3 (three) times daily with meals. 05/03/20   Shahmehdi, Valeria Batman, MD  insulin aspart (NOVOLOG) 100 UNIT/ML injection  Inject 0-5 Units into the skin at bedtime. 05/03/20   Shahmehdi, Valeria Batman, MD  insulin aspart protamine- aspart (NOVOLOG MIX 70/30) (70-30) 100 UNIT/ML injection Inject 0.55 mLs (55 Units total) into the skin 2 (two) times daily with a meal. 05/03/20   Shahmehdi, Seyed A, MD  ketoconazole (NIZORAL) 2 % cream Apply 1 application topically daily. Apply twice daily for 2 weeks to the ears 06/04/20   Recardo Evangelist, PA-C  losartan (COZAAR) 25 MG tablet Take 0.5 tablets (12.5 mg total) by mouth daily. Patient taking differently: Take 25 mg by mouth daily.  05/03/20   Shahmehdi, Valeria Batman, MD  MAGNESIUM PO Take 400 mg by mouth daily.     [provider]  metFORMIN (GLUCOPHAGE) 1000 MG tablet Take 1,000 mg by mouth 2 (two) times daily with a meal.    [provider]  metoprolol tartrate (LOPRESSOR) 25 MG tablet Take 1 tablet (25 mg total) by mouth 2 (two) times daily. 03/19/20   Barrett, Erin R, PA-C  Multiple Vitamins-Minerals (TAB-A-VITE) TABS Take 1 tablet by mouth daily. 07/27/19   [provider]  omeprazole (PRILOSEC) 40 MG capsule Take 40 mg by mouth daily.    [provider]    polyethylene glycol (MIRALAX / GLYCOLAX) 17 g packet Take 17 g by mouth daily as needed for mild constipation. 12/25/19   Manuella Ghazi, Pratik D, DO  potassium chloride SA (KLOR-CON) 20 MEQ tablet Take 1 tablet (20 mEq total) by mouth daily. 05/22/20   Strader, Fransisco Hertz, PA-C  tamsulosin (FLOMAX) 0.4 MG CAPS capsule Take 0.4 mg by mouth daily.    [provider]  traMADol (ULTRAM) 50 MG tablet Take 1-2 tablets (50-100 mg total) by mouth every 4 (four) hours as needed for moderate pain. 03/19/20   Barrett, Erin R, PA-C  venlafaxine XR (EFFEXOR-XR) 150 MG 24 hr capsule Take 150 mg by mouth daily with breakfast.    [provider]    Inpatient Medications: Scheduled Meds:  Continuous Infusions:  PRN Meds:   Allergies:    Allergies  Allergen Reactions  . Ace Inhibitors Swelling and Cough  . Other Itching    [name]  . Penicillins     Has patient had a PCN reaction causing immediate rash, facial/tongue/throat swelling, SOB or lightheadedness with hypotension: Unknown Has patient had a PCN reaction causing severe rash involving mucus membranes or skin necrosis: Unknown Has patient had a PCN reaction that required hospitalization: Unknown Has patient had a PCN reaction occurring within the last 10 years: Unknown If all of the above answers are "NO", then may proceed with Cephalosporin use.     Social History:   Social History   Socioeconomic History  . Marital status: Single    Spouse name: Not on file  . Number of children: Not on file  . Years of education: Not on file  . Highest education level: Not on file  Occupational History  . Not on file  Tobacco Use  . Smoking status: Former Smoker    Packs/day: 1.50    Types: Cigarettes    Quit date: 05/25/1995    Years since quitting: 25.2  . Smokeless tobacco: Never Used  Vaping Use  . Vaping Use: Never used  Substance and Sexual Activity  . Alcohol use: Yes    Comment: rarely  . Drug use: No  . Sexual  activity: Not on file  Other Topics Concern  . Not on file  Social History Narrative  .  Not on file   Social Determinants of Health   Financial Resource Strain:   . Difficulty of Paying Living Expenses: Not on file  Food Insecurity:   . Worried About Charity fundraiser in the Last Year: Not on file  . Ran Out of Food in the Last Year: Not on file  Transportation Needs:   . Lack of Transportation (Medical): Not on file  . Lack of Transportation (Non-Medical): Not on file  Physical Activity:   . Days of Exercise per Week: Not on file  . Minutes of Exercise per Session: Not on file  Stress:   . Feeling of Stress : Not on file  Social Connections:   . Frequency of Communication with Friends and Family: Not on file  . Frequency of Social Gatherings with Friends and Family: Not on file  . Attends Religious Services: Not on file  . Active Member of Clubs or Organizations: Not on file  . Attends Archivist Meetings: Not on file  . Marital Status: Not on file  Intimate Partner Violence:   . Fear of Current or Ex-Partner: Not on file  . Emotionally Abused: Not on file  . Physically Abused: Not on file  . Sexually Abused: Not on file     Family History:    Family History  Problem Relation Age of Onset  . Hypertension Mother       Review of Systems    General:  No chills, fever, night sweats or weight changes.  Cardiovascular:  No chest pain, palpitations. Positive for dyspnea on exertion and edema.  Dermatological: No rash, lesions/masses Respiratory: No cough, dyspnea Urologic: No hematuria, dysuria Abdominal:   No nausea, vomiting, diarrhea, bright red blood per rectum, melena, or hematemesis Neurologic:  No visual changes, wkns, changes in mental status. All other systems reviewed and are otherwise negative except as noted above.  Physical Exam/Data    Vitals:   08/01/20 0430 08/01/20 0500 08/01/20 0634 08/01/20 0636  BP: (!) 124/51 134/76 (!) 110/47    Pulse: 80 88 91 91  Resp: (!) 27 (!) 26 (!) 22 (!) 25  Temp:      TempSrc:      SpO2: 93% 95% 93% 98%    Intake/Output Summary (Last 24 hours) at 08/01/2020 0746 Last data filed at 08/01/2020 2993 Gross per 24 hour  Intake --  Output 2000 ml  Net -2000 ml   There were no vitals filed for this visit. There is no height or weight on file to calculate BMI.   General: Pleasant obese male appearing in NAD Psych: Normal affect. Neuro: Alert and oriented X 3. Moves all extremities spontaneously. HEENT: Normal  Neck: Supple without bruits or JVD. Lungs:  Resp regular and unlabored, decreased breath sounds along left base. Heart: RRR no s3, s4, or murmurs. Abdomen: Soft, non-tender, non-distended, BS + x 4.  Extremities: No clubbing or cyanosis. 2+ pitting edema bilaterally. DP/PT/Radials 2+ and equal bilaterally.   EKG:  The EKG was personally reviewed and demonstrates: Normal sinus rhythm, heart rate 82 with inferior infarct pattern. No acute ST changes when compared to prior tracings.  Telemetry:  Telemetry was personally reviewed and demonstrates: NSR, HR in 80's to 90's with occasional PVC's.    Labs/Studies     Relevant CV Studies:  Cardiac Catheterization: 02/2020  Prox RCA lesion is 99% stenosed.  Prox RCA to Mid RCA lesion is 40% stenosed.  Dist RCA lesion is 30% stenosed.  Prox LAD to  Mid LAD lesion is 80% stenosed.  1st Diag lesion is 60% stenosed.  Mid LM to Dist LM lesion is 40% stenosed.  Prox Cx to Mid Cx lesion is 95% stenosed.   1. 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 Moderately elevated LV/right heart filling pressures c/w known diagnosis of heart failure 3. Known severe LV dysfunction by echo  Recommend: review revascularization options. Pt has surgical anatomy, but not sure he will be a candidate for CABG.  High-risk multivessel PCI could be considered if he is not a candidate for surgery.  Limited Echo: 02/2020 IMPRESSIONS    1. Left ventricular ejection fraction, by estimation, is 20 to 25%. The  left ventricle has severely decreased function. The left ventricle  demonstrates global hypokinesis. There is moderate left ventricular  hypertrophy. Left ventricular diastolic  parameters are consistent with Grade III diastolic dysfunction  (restrictive).  2. Right ventricular systolic function is low normal. The right  ventricular size is normal. Tricuspid regurgitation signal is inadequate  for assessing PA pressure.  3. Left atrial size was mildly dilated.  4. The mitral valve is grossly normal. Mild mitral valve regurgitation.  5. The aortic valve was not well visualized. Aortic valve regurgitation  is not visualized. Mild aortic valve sclerosis is present, with no  evidence of aortic valve stenosis.  6. IVC dilated but respirophasic change not well visualized.    Echocardiogram: 04/2020 IMPRESSIONS    1. Left ventricular ejection fraction, by estimation, is 60 to 65%. The  left ventricle has normal function. The left ventricle has no regional  wall motion abnormalities. There is moderate left ventricular hypertrophy.  Left ventricular diastolic  parameters are consistent with Grade II diastolic dysfunction  (pseudonormalization). Elevated left atrial pressure.  2. Right ventricular systolic function is normal. The right ventricular  size is normal.  3. The mitral valve is normal in structure. No evidence of mitral valve  regurgitation. No evidence of mitral stenosis.  4. The aortic valve has an indeterminant number of cusps. Aortic valve  regurgitation is not visualized. No aortic stenosis is present.  5. The inferior vena cava is normal in size with greater than 50%  respiratory variability, suggesting right atrial pressure of 3 mmHg.   Laboratory  Data:  Chemistry Recent Labs  Lab 08/01/20 0346  NA 137  K 3.9  CL 93*  CO2 28  GLUCOSE 74  BUN 23*  CREATININE 1.10  CALCIUM 9.6  GFRNONAA >60  GFRAA >60  ANIONGAP 16*    Recent Labs  Lab 08/01/20 0346  PROT 7.9  ALBUMIN 3.8  AST 27  ALT 20  ALKPHOS 108  BILITOT 0.7   Hematology Recent Labs  Lab 08/01/20 0346  WBC 16.7*  RBC 4.23  HGB 10.9*  HCT 36.4*  MCV 86.1  MCH 25.8*  MCHC 29.9*  RDW 16.3*  PLT 471*   Cardiac EnzymesNo results for input(s): TROPONINI in the last 168 hours. No results for input(s): TROPIPOC in the last 168 hours.  BNP Recent Labs  Lab 08/01/20 0346  BNP 141.0*    DDimer No results for input(s): DDIMER in the last 168 hours.  Radiology/Studies:  CT Angio Chest PE W and/or Wo Contrast  Result Date: 08/01/2020 CLINICAL DATA:  Dyspnea EXAM: CT ANGIOGRAPHY CHEST WITH CONTRAST TECHNIQUE: Multidetector CT imaging of the chest was performed using the standard protocol during bolus administration of  intravenous contrast. Multiplanar CT image reconstructions and MIPs were obtained to evaluate the vascular anatomy. CONTRAST:  148mL OMNIPAQUE IOHEXOL 350 MG/ML SOLN COMPARISON:  03/05/2020 FINDINGS: Cardiovascular: There is adequate opacification of the pulmonary arterial tree. No intraluminal filling defect is identified to suggest acute pulmonary embolism. The central pulmonary arteries are of normal caliber. There is extensive coronary artery calcification within the native coronary arteries. Interval coronary artery bypass grafting has been performed. 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 Mild atherosclerotic calcification is seen within the thoracic aorta which is of normal caliber. Bovine arch anatomy noted. Mediastinum/Nodes: No pathologic adenopathy within the thorax. Esophagus unremarkable. Lungs/Pleura: Moderate left pleural effusion is present with compressive atelectasis  of the left lung. The right lung is clear. No pneumothorax. No pleural effusion on the right. The central airways are widely patent. Upper Abdomen: No acute abnormality. Musculoskeletal: No acute bone abnormality. Review of the MIP images confirms the above findings. IMPRESSION: Interval coronary artery bypass grafting. Sternotomy fragments demonstrate nonunion as no bridging callus is yet identified. Moderate left pleural effusion with associated compressive atelectasis of the left lung. No pulmonary embolism. Aortic Atherosclerosis (ICD10-I70.0). Electronically Signed   By: Fidela Salisbury MD   On: 08/01/2020 05:32   DG Chest Port 1 View  Result Date: 08/01/2020 CLINICAL DATA:  Dyspnea EXAM: PORTABLE CHEST 1 VIEW COMPARISON:  05/18/2020 FINDINGS: The patient is quite lordotic on this semi upright chest radiograph. Small left pleural effusion is again identified and has slightly increased in size since prior examination. Lungs are otherwise clear. No pneumothorax. No pleural effusion on the right. Median sternotomy has been performed. Cardiac size is likely stable when accounting for altered patient positioning. Pulmonary vascularity is normal. IMPRESSION: Enlarging, small left pleural effusion. Electronically Signed   By: Fidela Salisbury MD   On: 08/01/2020 04:25     Assessment & Plan    1. Acute on Chronic Diastolic CHF Exacerbation - His EF was previously reduced at 20-25% by echo in 02/2020 and had improved to 40% by echo in 03/2020 following CABG and had normalized to 60-65% by echo in 04/2020.  - He presented with worsening dyspnea on exertion, orthopnea, PND and edema despite taking Lasix 80mg  BID. Suspect dietary indiscretion is playing a significant role as well for he consumes frozen meals daily. Also has suspected OSA which is untreated.  -  BNP elevated to 141 and CTA showed a moderate left pleural effusion but no evidence of a PE. He already received IV Lasix 80 mg at 0400 with a recorded  output of -2.0L thus far. Will start IV Lasix 40mg  daily. Thoracentesis has been ordered by the admitting team. Previous d/c weight in 04/2020 was 279 lbs and he reports weight was up to 298 lbs on his home scales, therefore anticipate he will require several days of IV Lasix. Follow I&O's along with daily weights. May need to switch to Torsemide for improved bioavailability at the time of discharge. Importance of sodium and fluid restriction reviewed.   2. CAD - He is s/p CABG in 03/2020 with LIMA-LAD, RIMA-PL, RA-D1-RI-OM1. CTA this admission shows sternotomy with fragments demonstrating nonunion as no bridging callus was yet identified. - HS Troponin values have been negative this admission and EKG is without acute ischemic changes.  - Continue ASA, Plavix and statin therapy. PTA Lopressor held given soft BP.   3. HTN - BP has been variable at 92/50 - 148/72 since admission, at  128/66 on most recent check. He was on Losartan 25mg  daily and Lopressor 25mg  BID as an outpatient. Restart as BP allows.    4. HLD - He is overdue for a repeat FLP. Will add to AM labs. LDL was at 92 in 02/2020. Continue Atorvastatin 80mg  daily.   5. OSA - Being followed by [facility] as an outpatient. Was scheduled for a sleep study in 06/2020 but did not show up for his procedure. Will need to be rescheduled as an outpatient.   6. Weakness/Deconditioning - Would recommend PT eval once respiratory status improves.    For questions or updates, please contact [place] Please consult [email or web address] for contact info under Cardiology/STEMI.  Signed, Erma Heritage, PA-C 08/01/2020, 7:46 AM Pager: [phone]   Patient seen and discussed with PA Ahmed Prima, I agree with her documentation. 59 yo male history of CAD with prior CABG 02/2352, diastolic dysfunction, HTN, HL, DM2 admitted with SOB. Reports home weight up from 280 lbs to 298 lbs. +orthopnea.    ER vitals p 87 bp 148/72 100% RA K 3.9  Cr 1.10 WBC 16.7 Hgb 10.9 Plt 471 BNP 141 COVID + hstrop 11-->9 EKG SR, inferior Qwaves CXR left pleural effusion CT PE mod left pleural effusion, no PE 04/2020 Echo: LVEF 60-65%, mod LVH, grade II DDx, normal RV function,   Presents with acute on chronic diastolic HF. Reported 18 lbs weight gain at home. With obesity and diastolic HF BNP is not reflective of true volume status. Continue IV diuresis, will require several days likely.    History of CAD with CABG in 03/2020, no evidence of acute issues. Continue medical therapy   Carlyle Dolly MD

## 2020-08-01 NOTE — ED Provider Notes (Signed)
[facility] Provider Note   CSN: [id number] Arrival date & time: 08/01/20  5366     History Chief Complaint  Patient presents with  . Shortness of Breath    Shane Chambers is a 59 y.o. male.  Patient is a 59 year old male with past medical history of coronary artery disease with CABG in 5/21, congestive heart failure, ischemic cardiomyopathy, hypertension, obesity, and obstructive sleep apnea.  He presents today for evaluation of shortness of breath.  This has worsened over the past week.  He describes swelling to his legs.  He denies fevers or chills.  He denies chest pain.  The history is provided by the patient.  Shortness of Breath Severity:  Moderate Onset quality:  Gradual Duration:  1 week Timing:  Constant Progression:  Worsening Chronicity:  Recurrent Context: activity   Relieved by:  Nothing Worsened by:  Activity Ineffective treatments:  None tried      Past Medical History:  Diagnosis Date  . CAD (coronary artery disease)    a. s/p CABG in 03/2020 with LIMA-LAD, RIMA-PL, RA-D1-RI-OM1  . Depression   . Diabetes mellitus without complication ([place])   . Hyperlipidemia 12/01/2019  . Hypertension   . Ischemic cardiomyopathy    a. EF 20-25% by echo in 02/2020 b. at 40% by echo in 03/2020 c. EF normalized to 60-65% by echo in 04/2020  . Type 2 diabetes mellitus [facility])     Patient Active Problem List   Diagnosis Date Noted  . Visit for wound check 05/28/2020  . OSA (obstructive sleep apnea) 05/24/2020  . Acute exacerbation of chronic obstructive pulmonary disease (COPD) ([place]) 04/29/2020  . Leukocytosis 04/29/2020  . 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 Ischemic cardiomyopathy   . Coronary artery disease   . Acute on chronic combined systolic and diastolic CHF (congestive heart failure) ([place]) 03/05/2020  . Acute hyponatremia 03/05/2020  . Severe Vitamin D deficiency 12/02/2019  . Hypokalemia 12/01/2019  . Hyperlipidemia 12/01/2019  . BPH  (benign prostatic hyperplasia) 12/01/2019  . Normocytic anemia 12/01/2019  . Pneumonia due to COVID-19 virus 12/01/2019  . Hypoxia   . SOB (shortness of breath) 11/16/2019  . Hypertension   . Type 2 diabetes mellitus ([place])   . Depression   . Acute bronchitis with bronchospasm 11/15/2019    Past Surgical History:  Procedure Laterality Date  . CORONARY ARTERY BYPASS GRAFT N/A 03/13/2020   Procedure: CORONARY ARTERY BYPASS GRAFTING (CABG) times five using bilateral Internal mammary arteries and left radial artery;  Surgeon: [name], MD;  Location: [place];  Service: Open Heart Surgery;  Laterality: N/A;  . DENTAL SURGERY    . RADIAL ARTERY HARVEST Left 03/13/2020   Procedure: RADIAL ARTERY HARVEST,;  Surgeon: [name], MD;  Location: [place];  Service: Open Heart Surgery;  Laterality: Left;  . RIGHT/LEFT HEART CATH AND CORONARY ANGIOGRAPHY N/A 03/07/2020   Procedure: RIGHT/LEFT HEART CATH AND CORONARY ANGIOGRAPHY;  Surgeon: Sherren Mocha, MD;  Location: [place] CV LAB;  Service: Cardiovascular;  Laterality: N/A;  . TEE WITHOUT CARDIOVERSION N/A 03/13/2020   Procedure: TRANSESOPHAGEAL ECHOCARDIOGRAM (TEE);  Surgeon: [name], MD;  Location: [place];  Service: Open Heart Surgery;  Laterality: N/A;       Family History  Problem Relation Age of Onset  . Hypertension Mother     Social History   Tobacco Use  . Smoking status: Former Smoker    Packs/day: 1.50    Types: Cigarettes  Quit date: 05/25/1995    Years since quitting: 25.2  . Smokeless tobacco: Never Used  Vaping Use  . Vaping Use: Never used  Substance Use Topics  . Alcohol use: Yes    Comment: rarely  . Drug use: No    Home Medications Prior to Admission medications   Medication Sig Start Date End Date Taking? Authorizing Provider  albuterol (VENTOLIN HFA) 108 (90 Base) MCG/ACT inhaler Inhale 1-2 puffs into the lungs every 6 (six) hours as needed for wheezing or shortness of breath. 05/24/20    Rigoberto Noel, MD  alum & mag hydroxide-simeth (MAALOX PLUS) 400-400-40 MG/5ML suspension Take 30 mLs by mouth as needed for indigestion.    [provider]  aspirin 81 MG EC tablet Take 81 mg by mouth daily.  03/10/16   [provider]  atorvastatin (LIPITOR) 80 MG tablet Take 1 tablet (80 mg total) by mouth at bedtime. 03/19/20   Barrett, Erin R, PA-C  azithromycin (ZITHROMAX) 250 MG tablet Take 1 tablet (250 mg total) by mouth daily. Take first 2 tablets together, then 1 every day until finished. 06/03/20   Orpah Greek, MD  budesonide-formoterol (SYMBICORT) 160-4.5 MCG/ACT inhaler Inhale 2 puffs into the lungs in the morning and at bedtime. 05/24/20   Rigoberto Noel, MD  calcium carbonate (OS-CAL - DOSED IN MG OF ELEMENTAL CALCIUM) 1250 (500 Ca) MG tablet Take 1 tablet by mouth daily.    [provider]  cephALEXin (KEFLEX) 500 MG capsule Take 1 capsule (500 mg total) by mouth 4 (four) times daily. 06/04/20   Recardo Evangelist, PA-C  clopidogrel (PLAVIX) 75 MG tablet Take 1 tablet (75 mg total) by mouth daily. 03/20/20   Barrett, Erin R, PA-C  docusate sodium (COLACE) 100 MG capsule Take 1 capsule (100 mg total) by mouth 2 (two) times daily. 12/25/19   Manuella Ghazi, Pratik D, DO  ferrous sulfate 325 (65 FE) MG tablet Take 1 tablet (325 mg total) by mouth every other day. 05/03/20   Shahmehdi, Valeria Batman, MD  fluticasone (VERAMYST) 27.5 MCG/SPRAY nasal spray Place 2 sprays into the nose daily as needed for rhinitis or allergies.     [provider]  furosemide (LASIX) 80 MG tablet Take 1 tablet (80 mg total) by mouth 2 (two) times daily. 05/22/20   Strader, Fransisco Hertz, PA-C  gabapentin (NEURONTIN) 300 MG capsule Take 900 mg by mouth 2 (two) times daily.    [provider]  guaiFENesin-dextromethorphan (ROBITUSSIN DM) 100-10 MG/5ML syrup Take 5 mLs by mouth every 4 (four) hours as needed for cough. 05/03/20   Shahmehdi, Valeria Batman, MD  insulin aspart (NOVOLOG) 100  UNIT/ML injection Inject 0-20 Units into the skin 3 (three) times daily with meals. 05/03/20   Shahmehdi, Valeria Batman, MD  insulin aspart (NOVOLOG) 100 UNIT/ML injection Inject 0-5 Units into the skin at bedtime. 05/03/20   Shahmehdi, Valeria Batman, MD  insulin aspart protamine- aspart (NOVOLOG MIX 70/30) (70-30) 100 UNIT/ML injection Inject 0.55 mLs (55 Units total) into the skin 2 (two) times daily with a meal. 05/03/20   Shahmehdi, Seyed A, MD  ketoconazole (NIZORAL) 2 % cream Apply 1 application topically daily. Apply twice daily for 2 weeks to the ears 06/04/20   Recardo Evangelist, PA-C  losartan (COZAAR) 25 MG tablet Take 0.5 tablets (12.5 mg total) by mouth daily. Patient taking differently: Take 25 mg by mouth daily.  05/03/20   Shahmehdi, Valeria Batman, MD  MAGNESIUM PO Take 400 mg  by mouth daily.     [provider]  metFORMIN (GLUCOPHAGE) 1000 MG tablet Take 1,000 mg by mouth 2 (two) times daily with a meal.    [provider]  metoprolol tartrate (LOPRESSOR) 25 MG tablet Take 1 tablet (25 mg total) by mouth 2 (two) times daily. 03/19/20   Barrett, Erin R, PA-C  Multiple Vitamins-Minerals (TAB-A-VITE) TABS Take 1 tablet by mouth daily. 07/27/19   [provider]  omeprazole (PRILOSEC) 40 MG capsule Take 40 mg by mouth daily.    [provider]  polyethylene glycol (MIRALAX / GLYCOLAX) 17 g packet Take 17 g by mouth daily as needed for mild constipation. 12/25/19   Manuella Ghazi, Pratik D, DO  potassium chloride SA (KLOR-CON) 20 MEQ tablet Take 1 tablet (20 mEq total) by mouth daily. 05/22/20   Strader, Fransisco Hertz, PA-C  tamsulosin (FLOMAX) 0.4 MG CAPS capsule Take 0.4 mg by mouth daily.    [provider]  traMADol (ULTRAM) 50 MG tablet Take 1-2 tablets (50-100 mg total) by mouth every 4 (four) hours as needed for moderate pain. 03/19/20   Barrett, Erin R, PA-C  venlafaxine XR (EFFEXOR-XR) 150 MG 24 hr capsule Take 150 mg by mouth daily with breakfast.    [provider]     Allergies    Ace inhibitors, Other, and Penicillins  Review of Systems   Review of Systems  Respiratory: Positive for shortness of breath.   All other systems reviewed and are negative.   Physical Exam Updated Vital Signs BP (!) 148/72 (BP Location: Right Arm)   Pulse 87   Temp 98.7 F (37.1 C) (Oral)   Resp (!) 36   SpO2 100%   Physical Exam Vitals and nursing note reviewed.  Constitutional:      General: He is not in acute distress.    Appearance: He is well-developed. He is not diaphoretic.  HENT:     Head: Normocephalic and atraumatic.  Cardiovascular:     Rate and Rhythm: Normal rate and regular rhythm.     Heart sounds: No murmur heard.  No friction rub.  Pulmonary:     Effort: Pulmonary effort is normal. No respiratory distress.     Breath sounds: Examination of the right-lower field reveals rales. Examination of the left-lower field reveals rales. Rales present. No wheezing.  Abdominal:     General: Bowel sounds are normal. There is no distension.     Palpations: Abdomen is soft.     Tenderness: There is no abdominal tenderness.  Musculoskeletal:        General: Normal range of motion.     Cervical back: Normal range of motion and neck supple.     Right lower leg: Edema present.     Left lower leg: Edema present.     Comments: There is 3+ pitting edema both lower extremities.  Skin:    General: Skin is warm and dry.  Neurological:     Mental Status: He is alert and oriented to person, place, and time.     Coordination: Coordination normal.     ED Results / Procedures / Treatments   Labs (all labs ordered are listed, but only abnormal results are displayed) Labs Reviewed  SARS CORONAVIRUS 2 BY RT PCR (HOSPITAL ORDER, [facility] [facility])  COMPREHENSIVE METABOLIC PANEL  CBC WITH DIFFERENTIAL/PLATELET  BRAIN NATRIURETIC PEPTIDE  TROPONIN I (HIGH SENSITIVITY)    EKG EKG Interpretation  Date/Time:  Wednesday August 01 2020  03:49:49  EDT Ventricular Rate:  82 PR Interval:    QRS Duration: 105 QT Interval:  414 QTC Calculation: 484 R Axis:   20 Text Interpretation: Sinus rhythm Inferior infarct, age indeterminate No significant change since 04/29/2020 Confirmed by Veryl Speak [phone]) on 08/01/2020 4:07:24 AM   Radiology No results found.  Procedures Procedures (including critical care time)  Medications Ordered in ED Medications - No data to display  ED Course  I have reviewed the triage vital signs and the nursing notes.  Pertinent labs & imaging results that were available during my care of the patient were reviewed by me and considered in my medical decision making (see chart for details).    MDM Rules/Calculators/A&P  Patient presenting here with complaints of shortness of breath.  He has extensive past medical history as described in the HPI.  His dyspnea appears to be caused by a moderate sized left pleural effusion that is new from prior studies.  I am uncertain as to the etiology of this, but patient continues to be tachypneic and short of breath.  Patient placed on oxygen and admission discussed with the hospitalist. Patient will be evaluated and likely admitted.  Remainder of his work-up is essentially unremarkable.  His BNP is slightly elevated, but EKG is unchanged and troponin is negative.  He had a brisk diuresis after receiving IV Lasix here in the ER.  He was also given Solu-Medrol and albuterol, but continues with tachypnea.  Final Clinical Impression(s) / ED Diagnoses Final diagnoses:  None    Rx / DC Orders ED Discharge Orders    None       Veryl Speak, MD 08/01/20 [phone]

## 2020-08-01 NOTE — ED Notes (Signed)
BGL 297.  Notified RN

## 2020-08-02 LAB — BASIC METABOLIC PANEL
Anion gap: 11 (ref 5–15)
BUN: 33 mg/dL — ABNORMAL HIGH (ref 6–20)
CO2: 28 mmol/L (ref 22–32)
Calcium: 9.2 mg/dL (ref 8.9–10.3)
Chloride: 96 mmol/L — ABNORMAL LOW (ref 98–111)
Creatinine, Ser: 1.12 mg/dL (ref 0.61–1.24)
GFR calc Af Amer: >60 mL/min (ref >60)
GFR calc non Af Amer: >60 mL/min (ref >60)
Glucose, Bld: 166 mg/dL — ABNORMAL HIGH (ref 70–99)
Potassium: 3.9 mmol/L (ref 3.5–5.1)
Sodium: 135 mmol/L (ref 135–145)

## 2020-08-02 LAB — LIPID PANEL
Cholesterol: 140 mg/dL (ref 0–200)
HDL: 41 mg/dL (ref >40)
LDL Cholesterol: 82 mg/dL (ref 0–99)
Total CHOL/HDL Ratio: 3.4 RATIO
Triglycerides: 85 mg/dL (ref <150)
VLDL: 17 mg/dL (ref 0–40)

## 2020-08-02 LAB — GLUCOSE, CAPILLARY
Glucose-Capillary: 167 mg/dL — ABNORMAL HIGH (ref 70–99)
Glucose-Capillary: 180 mg/dL — ABNORMAL HIGH (ref 70–99)
Glucose-Capillary: 148 mg/dL — ABNORMAL HIGH (ref 70–99)
Glucose-Capillary: 87 mg/dL (ref 70–99)

## 2020-08-02 LAB — CBC
HCT: 32.1 % — ABNORMAL LOW (ref 39.0–52.0)
Hemoglobin: 9.8 g/dL — ABNORMAL LOW (ref 13.0–17.0)
MCH: 26 pg (ref 26.0–34.0)
MCHC: 30.5 g/dL (ref 30.0–36.0)
MCV: 85.1 fL (ref 80.0–100.0)
Platelets: 446 10*3/uL — ABNORMAL HIGH (ref 150–400)
RBC: 3.77 MIL/uL — ABNORMAL LOW (ref 4.22–5.81)
RDW: 16.2 % — ABNORMAL HIGH (ref 11.5–15.5)
WBC: 17.5 10*3/uL — ABNORMAL HIGH (ref 4.0–10.5)
nRBC: 0 % (ref 0.0–0.2)

## 2020-08-02 LAB — CYTOLOGY - NON PAP

## 2020-08-02 LAB — MAGNESIUM: Magnesium: 2 mg/dL (ref 1.7–2.4)

## 2020-08-02 LAB — MRSA PCR SCREENING: MRSA by PCR: POSITIVE — AB

## 2020-08-02 MED ORDER — LOSARTAN POTASSIUM 25 MG PO TABS
12.5000 mg | ORAL_TABLET | Freq: Every day | ORAL | Status: DC
  Administered 2020-08-02 – 2020-08-07 (×6): 12.5 mg via ORAL
  Filled 2020-08-02 (×6): qty 1

## 2020-08-02 MED ORDER — MUPIROCIN 2 % EX OINT
1.0000 "application " | TOPICAL_OINTMENT | Freq: Two times a day (BID) | CUTANEOUS | Status: AC
  Administered 2020-08-02 – 2020-08-06 (×8): 1 via NASAL
  Filled 2020-08-02 (×2): qty 22

## 2020-08-02 MED ORDER — ROSUVASTATIN CALCIUM 20 MG PO TABS
40.0000 mg | ORAL_TABLET | Freq: Every day | ORAL | Status: DC
  Administered 2020-08-02 – 2020-08-06 (×5): 40 mg via ORAL
  Filled 2020-08-02 (×5): qty 2

## 2020-08-02 NOTE — Progress Notes (Signed)
Progress Note  Patient Name: Shane Chambers Date of Encounter: 08/02/2020  [facility] Cardiologist: Carlyle Dolly, MD   Subjective   SOB improving but not resolved  Inpatient Medications    Scheduled Meds: . aspirin EC  81 mg Oral Daily  . atorvastatin  80 mg Oral QHS  . Chlorhexidine Gluconate Cloth  6 each Topical Daily  . clopidogrel  75 mg Oral Daily  . docusate sodium  100 mg Oral BID  . ferrous sulfate  325 mg Oral QODAY  . furosemide  40 mg Intravenous BID  . gabapentin  900 mg Oral BID  . heparin  5,000 Units Subcutaneous Q8H  . influenza vac split quadrivalent PF  0.5 mL Intramuscular Tomorrow-1000  . insulin aspart  0-15 Units Subcutaneous TID WC  . insulin aspart  0-5 Units Subcutaneous QHS  . insulin aspart  3 Units Subcutaneous TID WC  . insulin aspart protamine- aspart  55 Units Subcutaneous BID WC  . metoprolol succinate  25 mg Oral BID  . mometasone-formoterol  2 puff Inhalation BID  . pantoprazole  40 mg Oral Daily  . potassium chloride SA  20 mEq Oral Daily  . sodium chloride flush  3 mL Intravenous Q12H  . venlafaxine XR  150 mg Oral Q breakfast   Continuous Infusions: . sodium chloride     PRN Meds: sodium chloride, acetaminophen **OR** acetaminophen, alum & mag hydroxide-simeth, guaiFENesin-dextromethorphan, ipratropium-albuterol, ondansetron **OR** ondansetron (ZOFRAN) IV, polyethylene glycol, sodium chloride flush, traMADol   Vital Signs    Vitals:   08/02/20 0300 08/02/20 0400 08/02/20 0500 08/02/20 0600  BP: (!) 162/78 (!) 152/97  (!) 148/82  Pulse: 86 88  78  Resp: 19 (!) 21  17  Temp:  (!) 97.3 F (36.3 C)    TempSrc:  Axillary    SpO2: 94% 100%  99%  Weight:   135.8 kg   Height:        Intake/Output Summary (Last 24 hours) at 08/02/2020 0807 Last data filed at 08/02/2020 0515 Gross per 24 hour  Intake 1100 ml  Output 2780 ml  Net -1680 ml   Last 3 Weights 08/02/2020 08/01/2020 06/04/2020  Weight (lbs) 299 lb 6.2 oz 299  lb 6.2 oz 280 lb  Weight (kg) 135.8 kg 135.8 kg 127.007 kg      Telemetry    NSR- Personally Reviewed  ECG    n/a - Personally Reviewed  Physical Exam   GEN: No acute distress.   Neck: No JVD Cardiac: RRR, no murmurs, rubs, or gallops.  Respiratory: Clear to auscultation bilaterally. GI: Soft, nontender, non-distended  MS: 2+ bilateral LE edema; No deformity. Neuro:  Nonfocal  Psych: Normal affect   Labs    High Sensitivity Troponin:   Recent Labs  Lab 08/01/20 0346 08/01/20 0520  TROPONINIHS 11 9      Chemistry Recent Labs  Lab 08/01/20 0346 08/02/20 0428  NA 137 135  K 3.9 3.9  CL 93* 96*  CO2 28 28  GLUCOSE 74 166*  BUN 23* 33*  CREATININE 1.10 1.12  CALCIUM 9.6 9.2  PROT 7.9  --   ALBUMIN 3.8  --   AST 27  --   ALT 20  --   ALKPHOS 108  --   BILITOT 0.7  --   GFRNONAA >60 >60  GFRAA >60 >60  ANIONGAP 16* 11     Hematology Recent Labs  Lab 08/01/20 0346 08/02/20 0428  WBC 16.7* 17.5*  RBC 4.23  3.77*  HGB 10.9* 9.8*  HCT 36.4* 32.1*  MCV 86.1 85.1  MCH 25.8* 26.0  MCHC 29.9* 30.5  RDW 16.3* 16.2*  PLT 471* 446*    BNP Recent Labs  Lab 08/01/20 0346  BNP 141.0*     DDimer No results for input(s): DDIMER in the last 168 hours.   Radiology    DG Chest 1 View  Result Date: 08/01/2020 CLINICAL DATA:  Post thoracentesis EXAM: CHEST  1 VIEW COMPARISON:  08/01/2020 FINDINGS: Changes of CABG. Very low lung volumes. Patchy left lung airspace disease and right basilar opacities may reflect atelectasis or infiltrates. Suspect small left pleural effusion. No pneumothorax following thoracentesis. IMPRESSION: No pneumothorax following left thoracentesis. Small left pleural effusion. Patchy bilateral airspace disease, left greater than right, atelectasis versus infiltrates/pneumonia. Very low lung volumes. Electronically Signed   By: Rolm Baptise M.D.   On: 08/01/2020 09:33   CT Angio Chest PE W and/or Wo Contrast  Result Date:  08/01/2020 CLINICAL DATA:  Dyspnea EXAM: CT ANGIOGRAPHY CHEST WITH CONTRAST TECHNIQUE: Multidetector CT imaging of the chest was performed using the standard protocol during bolus administration of intravenous contrast. Multiplanar CT image reconstructions and MIPs were obtained to evaluate the vascular anatomy. CONTRAST:  137mL OMNIPAQUE IOHEXOL 350 MG/ML SOLN COMPARISON:  03/05/2020 FINDINGS: Cardiovascular: There is adequate opacification of the pulmonary arterial tree. No intraluminal filling defect is identified to suggest acute pulmonary embolism. The central pulmonary arteries are of normal caliber. There is extensive coronary artery calcification within the native coronary arteries. Interval coronary artery bypass grafting has been performed. 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 Mild atherosclerotic calcification is seen within the thoracic aorta which is of normal caliber. Bovine arch anatomy noted. Mediastinum/Nodes: No pathologic adenopathy within the thorax. Esophagus unremarkable. Lungs/Pleura: Moderate left pleural effusion is present with compressive atelectasis of the left lung. The right lung is clear. No pneumothorax. No pleural effusion on the right. The central airways are widely patent. Upper Abdomen: No acute abnormality. Musculoskeletal: No acute bone abnormality. Review of the MIP images confirms the above findings. IMPRESSION: Interval coronary artery bypass grafting. Sternotomy fragments demonstrate nonunion as no bridging callus is yet identified. Moderate left pleural effusion with associated compressive atelectasis of the left lung. No pulmonary embolism. Aortic Atherosclerosis (ICD10-I70.0). Electronically Signed   By: Fidela Salisbury MD   On: 08/01/2020 05:32   DG Chest Port 1 View  Result Date: 08/01/2020 CLINICAL DATA:  Dyspnea EXAM: PORTABLE CHEST 1 VIEW COMPARISON:  05/18/2020 FINDINGS: The patient is quite  lordotic on this semi upright chest radiograph. Small left pleural effusion is again identified and has slightly increased in size since prior examination. Lungs are otherwise clear. No pneumothorax. No pleural effusion on the right. Median sternotomy has been performed. Cardiac size is likely stable when accounting for altered patient positioning. Pulmonary vascularity is normal. IMPRESSION: Enlarging, small left pleural effusion. Electronically Signed   By: Fidela Salisbury MD   On: 08/01/2020 04:25   US THORACENTESIS ASP PLEURAL SPACE W/IMG GUIDE  Result Date: 08/01/2020 INDICATION: Left pleural effusion COPD; CHF Post CABG 03/2020 EXAM: ULTRASOUND GUIDED left THORACENTESIS MEDICATIONS: 10 cc 1% lidocaine. COMPLICATIONS: None immediate. PROCEDURE: An ultrasound guided thoracentesis was thoroughly discussed with the patient and questions answered. The benefits, risks, alternatives and complications were also discussed. The patient understands and wishes to proceed with the procedure. Written consent was obtained. Ultrasound was performed to localize and mark an adequate pocket  of fluid in the left chest. The area was then prepped and draped in the normal sterile fashion. 1% Lidocaine was used for local anesthesia. Under ultrasound guidance a 19 G Yueh catheter was introduced. Thoracentesis was performed. The catheter was removed and a dressing applied. FINDINGS: A total of approximately 120 cc of bloody fluid was removed. Samples were sent to the laboratory as requested by the clinical team. IMPRESSION: Successful ultrasound guided left thoracentesis yielding 120 cc of pleural fluid. Read by Lavonia Drafts [facility] Electronically Signed   By: Jerilynn Mages.  Shick M.D.   On: 08/01/2020 09:22    Cardiac Studies    Patient Profile     RONAN DUECKER is a 59 y.o. male with past medical history of CAD (s/p CABG in 03/2020 with LIMA-LAD, RIMA-PL, [id number]), chronic systolic CHF (EF 74-14% by echo in 02/2020, at 40% by  echo in 03/2020, normalized to 60-65% by echo in 04/2020), HTN, HLD, Type 2 DMand OSA who is being seen today for the evaluation of CHF at the request of Dr. Manuella Ghazi.   Assessment & Plan    1. Acute on chronic diastolic HF - 12/3951 Echo: LVEF 60-65%, mod LVH, grade II DDx, normal RV function - exacerbation secondary to dietary noncompliance - neg 1. 7 L yesterday, neg 3.6 L since admission. He is on IV lasix 40mg  bid. Stable renal function, continue IV diuresis today. - obtain limited echo reevaluate LV and RV function given significant new clinical HF.   2.CAD - He is s/p CABG in 03/2020 with LIMA-LAD, RIMA-PL, RA-D1-RI-OM1. - HS Troponin values have been negative this admission and EKG is without acute ischemic changes.  - CABG in setting of NSTEMI, committed to 1 year of DAPT. Restart his home losartan  3. Pleural effusion - s/p thoracentesis this admission, removed 147mL of bloody fluid  4. Leukocytosis - per primary team  5. Hyperlpidemia LDL not at goal of <70, change atorva 80 to crestor 40  For questions or updates, please contact [place] Please consult [email or web address] for contact info under        Signed, Carlyle Dolly, MD  08/02/2020, 8:07 AM

## 2020-08-02 NOTE — Progress Notes (Signed)
PROGRESS NOTE    Shane Chambers  HFW:[id number] DOB: 1961-01-05 DOA: 08/01/2020 PCP: [name], MD   Brief Narrative:  Per HPI:  Shane Chambers is a 59 y.o. male with medical history significant for CAD with CABG on 0/2774, grade 2 diastolic CHF with LVEF 12-87% on echo 04/2020, hypertension, dyslipidemia, type 2 diabetes, GERD, BPH, obesity, mood disorder, and iron deficiency anemia who presented to the ED with worsening shortness of breath.  He states that his shortness of breath has worsened over the past week and has had swelling to his bilateral lower extremities.  He denies any chest pain, fevers, or chills.  He states that his baseline weight is typically 280 pounds and he has come up to approximately 298 pounds.  He does have some orthopnea noted and also a dry cough, but no wheezing or chest tightness.  He states that he has some baseline shortness of breath with exertion that is not unusual for him.  He does not wear a CPAP mask at night or any other oxygen supplementation.  He has recently completed both of his Covid vaccinations within the last 2 weeks. Mother on phone states that patient has been drinking quite a bit of fluid.  Patient also states that he tends to eat a significant amount of boxed/processed foods.  He is apparently very compliant with his Lasix use and states that he may have missed 1 day in the last 2 weeks.  9/23: Patient has been admitted with acute on chronic diastolic CHF and has been diuresing quite well.  Plans to continue current diuresis as ordered per cardiology and check 2D echocardiogram today.  Assessment & Plan:   Active Problems:   CHF exacerbation (HCC)   Acute hypoxemic respiratory failure secondary to acute on chronic diastolic congestive heart failure with moderate left pleural effusion -Likely related to dietary/fluid restriction noncompliance -Recent 2D echocardiogram on 04/2020 with LVEF 60-65% and grade 2 diastolic CHF, repeat 2D  echocardiogram limited per cardiology -Status post thoracentesis 9/22 with 120 cc removed -Continue Lasix as ordered 40 mg IV twice daily with good diuresis noted, -1.9L overnight -Fluid restriction, strict input and output, daily weights -Wean oxygen as tolerated  Questionable COPD -Being evaluated by pulmonology, Dr. Elsworth Soho in outpatient setting -Prior history of tobacco abuse -Plan to order breathing treatments as needed -No significant bronchospasms currently noted, therefore we will avoid steroids especially in setting of diabetes  CAD with prior CABG 03/2020 -Continue dual antiplatelet therapy with aspirin and Plavix -Continue statin now with Crestor 40 mg -Hold beta-blocker given soft blood pressure readings -No sign of ACS currently noted  Type 2 diabetes with polyneuropathy-stable -Hold home Metformin -Continue home 70/30 insulin -Diabetic diet -SSI -Hold Metformin  History of hypertension-stabilized -Resume home metoprolol and hold losartan while on Lasix for diuresis -Monitor carefully on aggressive IV diuresis  Iron deficiency anemia -Currently appears stable -Continue monitoring -Continue home iron supplementation and stool softener -No overt bleeding noted  Mood disorder -Continue venlafaxine  BPH -Continue Flomax  GERD -Continue PPI  Obesity with possible OSA -Lifestyle changes -He still needs sleep study outpatient and does not wear CPAP at night   DVT prophylaxis:Heparin Code Status: Full Family Communication: None at bedside Disposition Plan:  Status is: Inpatient  Remains inpatient appropriate because:IV treatments appropriate due to intensity of illness or inability to take PO and Inpatient level of care appropriate due to severity of illness   Dispo: The patient is from: Home  Anticipated d/c is to: Home              Anticipated d/c date is: 3 days              Patient currently is not medically stable to d/c.   Patient continues to require IV diuresis for ongoing treatment of his heart failure with 2D echocardiogram pending.   Consultants:   Cardiology  Procedures:   Left-sided thoracentesis on 9/22 with 120 cc removed  See below  Antimicrobials:   None   Subjective: Patient seen and evaluated today with no new acute complaints or concerns. No acute concerns or events noted overnight.  He states that his shortness of breath is slowly improving.  Objective: Vitals:   08/02/20 0600 08/02/20 0700 08/02/20 0800 08/02/20 0853  BP: (!) 148/82 137/83 120/65   Pulse: 78 76 78   Resp: 17 17 16    Temp:      TempSrc:      SpO2: 99% 97% 100% 100%  Weight:      Height:        Intake/Output Summary (Last 24 hours) at 08/02/2020 0926 Last data filed at 08/02/2020 0515 Gross per 24 hour  Intake 860 ml  Output 2780 ml  Net -1920 ml   Filed Weights   08/01/20 2015 08/02/20 0500  Weight: 135.8 kg 135.8 kg    Examination:  General exam: Appears calm and comfortable  Respiratory system: Clear to auscultation. Respiratory effort normal.  Currently on nasal cannula 2.5 L. Cardiovascular system: S1 & S2 heard, RRR. Gastrointestinal system: Abdomen is nondistended, soft and nontender. Central nervous system: Alert and oriented. No focal neurological deficits. Extremities: Edema 1+ to bilateral shins Skin: No rashes, lesions or ulcers Psychiatry: Judgement and insight appear normal. Mood & affect appropriate.     Data Reviewed: I have personally reviewed following labs and imaging studies  CBC: Recent Labs  Lab 08/01/20 0346 08/02/20 0428  WBC 16.7* 17.5*  NEUTROABS 11.2*  --   HGB 10.9* 9.8*  HCT 36.4* 32.1*  MCV 86.1 85.1  PLT 471* 643*   Basic Metabolic Panel: Recent Labs  Lab 08/01/20 0346 08/02/20 0428  NA 137 135  K 3.9 3.9  CL 93* 96*  CO2 28 28  GLUCOSE 74 166*  BUN 23* 33*  CREATININE 1.10 1.12  CALCIUM 9.6 9.2  MG  --  2.0   GFR: Estimated Creatinine  Clearance: 99.5 mL/min (by C-G formula based on SCr of 1.12 mg/dL). Liver Function Tests: Recent Labs  Lab 08/01/20 0346  AST 27  ALT 20  ALKPHOS 108  BILITOT 0.7  PROT 7.9  ALBUMIN 3.8   No results for input(s): LIPASE, AMYLASE in the last 168 hours. No results for input(s): AMMONIA in the last 168 hours. Coagulation Profile: No results for input(s): INR, PROTIME in the last 168 hours. Cardiac Enzymes: No results for input(s): CKTOTAL, CKMB, CKMBINDEX, TROPONINI in the last 168 hours. BNP (last 3 results) No results for input(s): PROBNP in the last 8760 hours. HbA1C: Recent Labs    08/01/20 0346  HGBA1C 7.3*   CBG: Recent Labs  Lab 08/01/20 0924 08/01/20 1248 08/01/20 1735 08/01/20 2218 08/02/20 0748  GLUCAP 194* 297* 260* 198* 167*   Lipid Profile: Recent Labs    08/02/20 0428  CHOL 140  HDL 41  LDLCALC 82  TRIG 85  CHOLHDL 3.4   Thyroid Function Tests: No results for input(s): TSH, T4TOTAL, FREET4, T3FREE, THYROIDAB in the last 72 hours.  Anemia Panel: No results for input(s): VITAMINB12, FOLATE, FERRITIN, TIBC, IRON, RETICCTPCT in the last 72 hours. Sepsis Labs: No results for input(s): PROCALCITON, LATICACIDVEN in the last 168 hours.  Recent Results (from the past 240 hour(s))  SARS Coronavirus 2 by RT PCR (hospital order, performed in [facility] hospital lab) Nasopharyngeal Nasopharyngeal Swab     Status: None   Collection Time: 08/01/20  3:42 AM   Specimen: Nasopharyngeal Swab  Result Value Ref Range Status   SARS Coronavirus 2 NEGATIVE NEGATIVE Final    Comment: (NOTE) SARS-CoV-2 target nucleic acids are NOT DETECTED.  The SARS-CoV-2 RNA is generally detectable in upper and lower respiratory specimens during the acute phase of infection. The lowest concentration of SARS-CoV-2 viral copies this assay can detect is 250 copies / mL. A negative result does not preclude SARS-CoV-2 infection and should not be used as the sole basis for treatment or  other patient management decisions.  A negative result may occur with improper specimen collection / handling, submission of specimen other than nasopharyngeal swab, presence of viral mutation(s) within the areas targeted by this assay, and inadequate number of viral copies (<250 copies / mL). A negative result must be combined with clinical observations, patient history, and epidemiological information.  Fact Sheet for Patients:   StrictlyIdeas.no  Fact Sheet for Healthcare Providers: BankingDealers.co.za  This test is not yet approved or  cleared by the [place] FDA and has been authorized for detection and/or diagnosis of SARS-CoV-2 by FDA under an Emergency Use Authorization (EUA).  This EUA will remain in effect (meaning this test can be used) for the duration of the COVID-19 declaration under Section 564(b)(1) of the Act, 21 U.S.C. section 360bbb-3(b)(1), unless the authorization is terminated or revoked sooner.  Performed at [facility], [address], [place], [place] [address]   Gram stain     Status: None   Collection Time: 08/01/20  9:07 AM   Specimen: Pleura; Body Fluid  Result Value Ref Range Status   Specimen Description PLEURAL  Final   Special Requests NONE  Final   Gram Stain   Final    CYTOSPIN SMEAR NO ORGANISMS SEEN WBC PRESENT,BOTH PMN AND MONONUCLEAR Performed at [facility], [address], [place], [place] [address]    Report Status 08/01/2020 FINAL  Final  Culture, body fluid-bottle     Status: None (Preliminary result)   Collection Time: 08/01/20  9:07 AM   Specimen: Pleura  Result Value Ref Range Status   Specimen Description PLEURAL  Final   Special Requests NONE  Final   Culture   Final    NO GROWTH < 24 HOURS Performed at [facility] [address], [place], [place] [address]    Report Status PENDING  Incomplete  MRSA PCR Screening     Status: Abnormal   Collection Time: 08/02/20  5:00  AM   Specimen: Nasal Mucosa; Nasopharyngeal  Result Value Ref Range Status   MRSA by PCR POSITIVE (A) NEGATIVE Final    Comment:        The GeneXpert MRSA Assay (FDA approved for NASAL specimens only), is one component of a comprehensive MRSA colonization surveillance program. It is not intended to diagnose MRSA infection nor to guide or monitor treatment for MRSA infections. RESULT CALLED TO, READ BACK BY AND VERIFIED WITH: DAVIS,C@0830  BY MATTHEWS, B 9.23.2021 Performed at [facility], [address], [place],  [address]          Radiology Studies: DG Chest  1 View  Result Date: 08/01/2020 CLINICAL DATA:  Post thoracentesis EXAM: CHEST  1 VIEW COMPARISON:  08/01/2020 FINDINGS: Changes of CABG. Very low lung volumes. Patchy left lung airspace disease and right basilar opacities may reflect atelectasis or infiltrates. Suspect small left pleural effusion. No pneumothorax following thoracentesis. IMPRESSION: No pneumothorax following left thoracentesis. Small left pleural effusion. Patchy bilateral airspace disease, left greater than right, atelectasis versus infiltrates/pneumonia. Very low lung volumes. Electronically Signed   By: Rolm Baptise M.D.   On: 08/01/2020 09:33   CT Angio Chest PE W and/or Wo Contrast  Result Date: 08/01/2020 CLINICAL DATA:  Dyspnea EXAM: CT ANGIOGRAPHY CHEST WITH CONTRAST TECHNIQUE: Multidetector CT imaging of the chest was performed using the standard protocol during bolus administration of intravenous contrast. Multiplanar CT image reconstructions and MIPs were obtained to evaluate the vascular anatomy. CONTRAST:  138mL OMNIPAQUE IOHEXOL 350 MG/ML SOLN COMPARISON:  03/05/2020 FINDINGS: Cardiovascular: There is adequate opacification of the pulmonary arterial tree. No intraluminal filling defect is identified to suggest acute pulmonary embolism. The central pulmonary arteries are of normal caliber. There is extensive coronary artery calcification  within the native coronary arteries. Interval coronary artery bypass grafting has been performed. 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 Mild atherosclerotic calcification is seen within the thoracic aorta which is of normal caliber. Bovine arch anatomy noted. Mediastinum/Nodes: No pathologic adenopathy within the thorax. Esophagus unremarkable. Lungs/Pleura: Moderate left pleural effusion is present with compressive atelectasis of the left lung. The right lung is clear. No pneumothorax. No pleural effusion on the right. The central airways are widely patent. Upper Abdomen: No acute abnormality. Musculoskeletal: No acute bone abnormality. Review of the MIP images confirms the above findings. IMPRESSION: Interval coronary artery bypass grafting. Sternotomy fragments demonstrate nonunion as no bridging callus is yet identified. Moderate left pleural effusion with associated compressive atelectasis of the left lung. No pulmonary embolism. Aortic Atherosclerosis (ICD10-I70.0). Electronically Signed   By: Fidela Salisbury MD   On: 08/01/2020 05:32   DG Chest Port 1 View  Result Date: 08/01/2020 CLINICAL DATA:  Dyspnea EXAM: PORTABLE CHEST 1 VIEW COMPARISON:  05/18/2020 FINDINGS: The patient is quite lordotic on this semi upright chest radiograph. Small left pleural effusion is again identified and has slightly increased in size since prior examination. Lungs are otherwise clear. No pneumothorax. No pleural effusion on the right. Median sternotomy has been performed. Cardiac size is likely stable when accounting for altered patient positioning. Pulmonary vascularity is normal. IMPRESSION: Enlarging, small left pleural effusion. Electronically Signed   By: Fidela Salisbury MD   On: 08/01/2020 04:25   US THORACENTESIS ASP PLEURAL SPACE W/IMG GUIDE  Result Date: 08/01/2020 INDICATION: Left pleural effusion COPD; CHF Post CABG 03/2020 EXAM: ULTRASOUND  GUIDED left THORACENTESIS MEDICATIONS: 10 cc 1% lidocaine. COMPLICATIONS: None immediate. PROCEDURE: An ultrasound guided thoracentesis was thoroughly discussed with the patient and questions answered. The benefits, risks, alternatives and complications were also discussed. The patient understands and wishes to proceed with the procedure. Written consent was obtained. Ultrasound was performed to localize and mark an adequate pocket of fluid in the left chest. The area was then prepped and draped in the normal sterile fashion. 1% Lidocaine was used for local anesthesia. Under ultrasound guidance a 19 G Yueh catheter was introduced. Thoracentesis was performed. The catheter was removed and a dressing applied. FINDINGS: A total of approximately 120 cc of bloody fluid was removed. Samples were sent to the laboratory as  requested by the clinical team. IMPRESSION: Successful ultrasound guided left thoracentesis yielding 120 cc of pleural fluid. Read by Lavonia Drafts [facility] Electronically Signed   By: Jerilynn Mages.  Shick M.D.   On: 08/01/2020 09:22        Scheduled Meds: . aspirin EC  81 mg Oral Daily  . Chlorhexidine Gluconate Cloth  6 each Topical Daily  . clopidogrel  75 mg Oral Daily  . docusate sodium  100 mg Oral BID  . ferrous sulfate  325 mg Oral QODAY  . furosemide  40 mg Intravenous BID  . gabapentin  900 mg Oral BID  . heparin  5,000 Units Subcutaneous Q8H  . insulin aspart  0-15 Units Subcutaneous TID WC  . insulin aspart  0-5 Units Subcutaneous QHS  . insulin aspart  3 Units Subcutaneous TID WC  . insulin aspart protamine- aspart  55 Units Subcutaneous BID WC  . losartan  12.5 mg Oral Daily  . metoprolol succinate  25 mg Oral BID  . mometasone-formoterol  2 puff Inhalation BID  . mupirocin ointment  1 application Nasal BID  . pantoprazole  40 mg Oral Daily  . potassium chloride SA  20 mEq Oral Daily  . rosuvastatin  40 mg Oral QHS  . sodium chloride flush  3 mL Intravenous Q12H  . venlafaxine  XR  150 mg Oral Q breakfast   Continuous Infusions: . sodium chloride       LOS: 1 day    Time spent: 30 minutes    Sayde Lish Darleen Crocker, DO [facility]  If 7PM-7AM, please contact night-coverage [email or web address] 08/02/2020, 9:26 AM

## 2020-08-02 NOTE — TOC Initial Note (Addendum)
Transition of Care [facility]) - Initial/Assessment Note    Patient Details  Name: Shane Chambers MRN: [id number] Date of Birth: [birth year]  [facility]) CM/SW Contact:    Iona Beard, [place] Phone Number: 08/02/2020, 12:11 PM  Clinical Narrative:                 Pt admitted for CHF exacerbation. Pt high risk for readmission. TOC completed assessment with pt. Pt lives at home with his mother, sister, and niece. Pt can bathe and dress himself with limited assistance. Pt does drive, transportation is provided by his mother and sister. Pt has a wheelchair and beside commode, though needs a replacement. CSW made referral to [facility] with [employer]. Once order is in [place] will have BSC drop shipped to pts home. Pt stated he was set up with [facility] for PT/OT. Pt stated they never contacted him after RN completed intake assessment. CSW spoke with Tim at [facility] to follow up about prior [facility]. Tim stated that pts home was deemed an unsafe environment for [facility] work with pt. Pt states that he was previously supposed to get evaluated by psych at past admissions at other facilities though he does not think they actually did anything for him other than prescribe Prozac for a few days. Pt stated that he does not feel very safe at his home. Pt states that he feels his environment is unsafe because there is "limited electricity, creatures, and problems with the floor". Pt also stated that emotionally he does not feel safe because of his complicated relationship with his mother. Pt states that his mother calls him names, picks fights, and that she is losing her memory and says she cannot take care of him. Due to pts concerns about returning home CSW spoke with pt about interest in long term care. Pt stated that he is not interested in going to a nursing home  Pt states that he does not keep up with his daily weights. He admits to trying but not being good with creating habits. Pt also stated that he does not follow  a heart healthy diet. Pts cardiologist is Dr. Harl Bowie. Pt also states that he attempts to take medications as prescribed but sometimes forget. CSW explained importance of weighing daily, taking medications as prescribed, and eating a heart healthy diet. TOC to follow.      Patient Goals and CMS Choice        Expected Discharge Plan and Services                                                Prior Living Arrangements/Services                       Activities of Daily Living Home Assistive Devices/Equipment: Bedside commode/3-in-1 ADL Screening (condition at time of admission) Patient's cognitive ability adequate to safely complete daily activities?: Yes Is the patient deaf or have difficulty hearing?: No Does the patient have difficulty seeing, even when wearing glasses/contacts?: No Does the patient have difficulty concentrating, remembering, or making decisions?: No Patient able to express need for assistance with ADLs?: Yes Does the patient have difficulty dressing or bathing?: No Independently performs ADLs?: Yes (appropriate for developmental age) Does the patient have difficulty walking or climbing stairs?: Yes Weakness of Legs: Both Weakness of Arms/Hands: None  Permission Sought/Granted                  Emotional Assessment              Admission diagnosis:  Pleural effusion [J90] Status post thoracentesis [Z98.890] CHF exacerbation (HCC) [I50.9] Pleural effusion, left [J90] Dyspnea, unspecified type [R06.00] Patient Active Problem List   Diagnosis Date Noted  . CHF exacerbation ([place]) 08/01/2020  . Visit for wound check 05/28/2020  . OSA (obstructive sleep apnea) 05/24/2020  . Acute exacerbation of chronic obstructive pulmonary disease (COPD) ([place]) 04/29/2020  . Leukocytosis 04/29/2020  . 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 Ischemic cardiomyopathy   . Coronary artery disease   . Acute on chronic combined systolic and diastolic CHF  (congestive heart failure) ([place]) 03/05/2020  . Acute hyponatremia 03/05/2020  . Severe Vitamin D deficiency 12/02/2019  . Hypokalemia 12/01/2019  . Hyperlipidemia 12/01/2019  . BPH (benign prostatic hyperplasia) 12/01/2019  . Normocytic anemia 12/01/2019  . Pneumonia due to COVID-19 virus 12/01/2019  . Hypoxia   . SOB (shortness of breath) 11/16/2019  . Hypertension   . Type 2 diabetes mellitus ([place])   . Depression   . Acute bronchitis with bronchospasm 11/15/2019   PCP:  [name], MD Pharmacy:   [id number] [place] [place] [address] Phone: [phone] Fax: [phone]     Social Determinants of Health (SDOH) Interventions    Readmission Risk Interventions Readmission Risk Prevention Plan 03/07/2020 12/06/2019  Transportation Screening Complete Complete  PCP or Specialist Appt within 3-5 Days - Not Complete  HRI or [place] - Not Complete  Social Work Consult for [place] Planning/Counseling - Not Complete  Palliative Care Screening - Not Complete  Medication Review Press photographer) Complete Complete  Some recent data might be hidden

## 2020-08-03 ENCOUNTER — Inpatient Hospital Stay (HOSPITAL_COMMUNITY): Payer: Medicaid Other

## 2020-08-03 DIAGNOSIS — R0602 Shortness of breath: Secondary | ICD-10-CM

## 2020-08-03 LAB — CBC
HCT: 34.5 % — ABNORMAL LOW (ref 39.0–52.0)
Hemoglobin: 10.2 g/dL — ABNORMAL LOW (ref 13.0–17.0)
MCH: 25.8 pg — ABNORMAL LOW (ref 26.0–34.0)
MCHC: 29.6 g/dL — ABNORMAL LOW (ref 30.0–36.0)
MCV: 87.3 fL (ref 80.0–100.0)
Platelets: 415 10*3/uL — ABNORMAL HIGH (ref 150–400)
RBC: 3.95 MIL/uL — ABNORMAL LOW (ref 4.22–5.81)
RDW: 16.3 % — ABNORMAL HIGH (ref 11.5–15.5)
WBC: 15.2 10*3/uL — ABNORMAL HIGH (ref 4.0–10.5)
nRBC: 0 % (ref 0.0–0.2)

## 2020-08-03 LAB — GLUCOSE, CAPILLARY
Glucose-Capillary: 134 mg/dL — ABNORMAL HIGH (ref 70–99)
Glucose-Capillary: 196 mg/dL — ABNORMAL HIGH (ref 70–99)
Glucose-Capillary: 79 mg/dL (ref 70–99)
Glucose-Capillary: 106 mg/dL — ABNORMAL HIGH (ref 70–99)

## 2020-08-03 LAB — ECHOCARDIOGRAM LIMITED
AR max vel: 1.59 cm2
AV Area VTI: 1.67 cm2
AV Area mean vel: 1.63 cm2
AV Mean grad: 8.1 mmHg
AV Peak grad: 13 mmHg
Ao pk vel: 1.8 m/s
Area-P 1/2: 4.52 cm2
Height: 69 in
S' Lateral: 3.71 cm
Weight: 4776.05 oz

## 2020-08-03 LAB — BASIC METABOLIC PANEL
Anion gap: 11 (ref 5–15)
BUN: 42 mg/dL — ABNORMAL HIGH (ref 6–20)
CO2: 29 mmol/L (ref 22–32)
Calcium: 9.1 mg/dL (ref 8.9–10.3)
Chloride: 97 mmol/L — ABNORMAL LOW (ref 98–111)
Creatinine, Ser: 1.19 mg/dL (ref 0.61–1.24)
GFR calc Af Amer: >60 mL/min (ref >60)
GFR calc non Af Amer: >60 mL/min (ref >60)
Glucose, Bld: 124 mg/dL — ABNORMAL HIGH (ref 70–99)
Potassium: 3.8 mmol/L (ref 3.5–5.1)
Sodium: 137 mmol/L (ref 135–145)

## 2020-08-03 LAB — MAGNESIUM: Magnesium: 2 mg/dL (ref 1.7–2.4)

## 2020-08-03 MED ORDER — PERFLUTREN LIPID MICROSPHERE
1.0000 mL | INTRAVENOUS | Status: AC | PRN
  Administered 2020-08-03: 1 mL via INTRAVENOUS
  Filled 2020-08-03: qty 10

## 2020-08-03 NOTE — Evaluation (Signed)
Physical Therapy Evaluation Patient Details Name: Shane Chambers MRN: [id number] DOB: 1960-11-11 Today's Date: 08/03/2020   History of Present Illness  Shane Chambers is a 59 y.o. male with medical history significant for CAD with CABG on 07/1504, grade 2 diastolic CHF with LVEF 69-79% on echo 04/2020, hypertension, dyslipidemia, type 2 diabetes, GERD, BPH, obesity, mood disorder, and iron deficiency anemia who presented to the ED with worsening shortness of breath.  He states that his shortness of breath has worsened over the past week and has had swelling to his bilateral lower extremities.  He denies any chest pain, fevers, or chills.  He states that his baseline weight is typically 280 pounds and he has come up to approximately 298 pounds.  He does have some orthopnea noted and also a dry cough, but no wheezing or chest tightness.  He states that he has some baseline shortness of breath with exertion that is not unusual for him.  He does not wear a CPAP mask at night or any other oxygen supplementation.  He has recently completed both of his Covid vaccinations within the last 2 weeks. Mother on phone states that patient has been drinking quite a bit of fluid.  Patient also states that he tends to eat a significant amount of boxed/processed foods.  He is apparently very compliant with his Lasix use and states that he may have missed 1 day in the last 2 weeks.    Clinical Impression  Patient limited for functional mobility as stated below secondary to BLE weakness, fatigue and poor standing balance. Patient requires min/mod assist and use of bed rails to transition to seated EOB. Patient demonstrates good sitting tolerance and sitting balance while seated EOB today. He transfers to standing with min/mod assist with RW. He requires RW for UE support with standing and demonstrates impaired standing tolerance secondary to fatigue and LE pain with weightbearing. Patient able to ambulate several lateral steps  at bedside with RW and cueing for sequencing. Patient returned to bed at end of session. Patient will benefit from continued physical therapy in hospital and recommended venue below to increase strength, balance, endurance for safe ADLs and gait.     Follow Up Recommendations SNF;Supervision/Assistance - 24 hour;Supervision for mobility/OOB    Equipment Recommendations  None recommended by PT    Recommendations for Other Services       Precautions / Restrictions Precautions Precautions: Fall Restrictions Weight Bearing Restrictions: No      Mobility  Bed Mobility Overal bed mobility: Needs Assistance Bed Mobility: Supine to Sit;Sit to Supine       Sit to supine: Min assist   General bed mobility comments: slow, labored transition to seated EOB with use of bed rails  Transfers Overall transfer level: Needs assistance Equipment used: Rolling walker (2 wheeled) Transfers: Sit to/from Stand Sit to Stand: Min assist;Mod assist         General transfer comment: min/mod assist to transition to standing with RW from EOB  Ambulation/Gait Ambulation/Gait assistance: Min assist;Mod assist Gait Distance (Feet): 2 Feet Assistive device: Rolling walker (2 wheeled)       General Gait Details: limited to several lateral steps with verbal cueing for sequencing with RW. limited standing tolerance due to fatigue and LE pain  Stairs            Wheelchair Mobility    Modified Rankin (Stroke Patients Only)       Balance Overall balance assessment: Needs assistance   Sitting balance-Leahy  Scale: Good Sitting balance - Comments: seated EOB   Standing balance support: Bilateral upper extremity supported Standing balance-Leahy Scale: Poor Standing balance comment: fair/poor requires RW, fatigues quickly                             Pertinent Vitals/Pain Pain Assessment: Faces Faces Pain Scale: Hurts little more Pain Location: legs and feet Pain  Intervention(s): Limited activity within patient's tolerance;Monitored during session;Repositioned    Home Living Family/patient expects to be discharged to:: Private residence Living Arrangements: Parent;Other relatives Available Help at Discharge: Family;Available PRN/intermittently Type of Home: Mobile home Home Access: Stairs to enter Entrance Stairs-Rails: Right Entrance Stairs-Number of Steps: 5-6 Home Layout: One level Home Equipment: Walker - 2 wheels;Cane - quad;Bedside commode;Wheelchair - manual      Prior Function Level of Independence: Needs assistance   Gait / Transfers Assistance Needed: household ambulator using RW, uses electric scooters where available when shopping; states he is supposed to be in w/c for mobility but unable to use in his home  ADL's / Homemaking Assistance Needed: family assists        Hand Dominance        Extremity/Trunk Assessment   Upper Extremity Assessment Upper Extremity Assessment: Generalized weakness    Lower Extremity Assessment Lower Extremity Assessment: Generalized weakness    Cervical / Trunk Assessment Cervical / Trunk Assessment: Normal  Communication   Communication: No difficulties  Cognition Arousal/Alertness: Awake/alert Behavior During Therapy: WFL for tasks assessed/performed Overall Cognitive Status: Within Functional Limits for tasks assessed                                        General Comments      Exercises     Assessment/Plan    PT Assessment Patient needs continued PT services  PT Problem List Decreased strength;Decreased mobility;Decreased activity tolerance;Decreased balance;Decreased knowledge of use of DME       PT Treatment Interventions DME instruction;Gait training;Stair training;Therapeutic exercise;Balance training;Neuromuscular re-education;Functional mobility training;Therapeutic activities;Patient/family education    PT Goals (Current goals can be found in the  Care Plan section)  Acute Rehab PT Goals Patient Stated Goal: return home PT Goal Formulation: With patient Time For Goal Achievement: 08/17/20 Potential to Achieve Goals: Fair    Frequency Min 3X/week   Barriers to discharge        Co-evaluation               AM-PAC PT "6 Clicks" Mobility  Outcome Measure Help needed turning from your back to your side while in a flat bed without using bedrails?: A Little Help needed moving from lying on your back to sitting on the side of a flat bed without using bedrails?: A Little Help needed moving to and from a bed to a chair (including a wheelchair)?: A Lot Help needed standing up from a chair using your arms (e.g., wheelchair or bedside chair)?: A Little Help needed to walk in hospital room?: A Lot Help needed climbing 3-5 steps with a railing? : Total 6 Click Score: 14    End of Session Equipment Utilized During Treatment: Gait belt Activity Tolerance: Patient limited by fatigue;Patient tolerated treatment well Patient left: in bed;with call bell/phone within reach Nurse Communication: Mobility status PT Visit Diagnosis: Unsteadiness on feet (R26.81);Other abnormalities of gait and mobility (R26.89);Muscle weakness (generalized) (M62.81)  Time: 3382-5053 PT Time Calculation (min) (ACUTE ONLY): 14 min   Charges:   PT Evaluation $PT Eval Moderate Complexity: 1 Mod PT Treatments $Therapeutic Activity: 8-22 mins        2:50 PM, 08/03/20 Mearl Latin PT, DPT Physical Therapist at [facility]

## 2020-08-03 NOTE — Progress Notes (Signed)
Progress Note  Patient Name: GENERAL WEARING Date of Encounter: 08/03/2020  [facility] Cardiologist: Carlyle Dolly, MD   Subjective   Some improvement in SOB but not resolved  Inpatient Medications    Scheduled Meds: . aspirin EC  81 mg Oral Daily  . Chlorhexidine Gluconate Cloth  6 each Topical Daily  . clopidogrel  75 mg Oral Daily  . docusate sodium  100 mg Oral BID  . ferrous sulfate  325 mg Oral QODAY  . furosemide  40 mg Intravenous BID  . gabapentin  900 mg Oral BID  . heparin  5,000 Units Subcutaneous Q8H  . insulin aspart  0-15 Units Subcutaneous TID WC  . insulin aspart  0-5 Units Subcutaneous QHS  . insulin aspart  3 Units Subcutaneous TID WC  . insulin aspart protamine- aspart  55 Units Subcutaneous BID WC  . losartan  12.5 mg Oral Daily  . metoprolol succinate  25 mg Oral BID  . mometasone-formoterol  2 puff Inhalation BID  . mupirocin ointment  1 application Nasal BID  . pantoprazole  40 mg Oral Daily  . potassium chloride SA  20 mEq Oral Daily  . rosuvastatin  40 mg Oral QHS  . sodium chloride flush  3 mL Intravenous Q12H  . venlafaxine XR  150 mg Oral Q breakfast   Continuous Infusions: . sodium chloride     PRN Meds: sodium chloride, acetaminophen **OR** acetaminophen, alum & mag hydroxide-simeth, guaiFENesin-dextromethorphan, ipratropium-albuterol, ondansetron **OR** ondansetron (ZOFRAN) IV, perflutren lipid microspheres (DEFINITY) IV suspension, polyethylene glycol, sodium chloride flush, traMADol   Vital Signs    Vitals:   08/03/20 0000 08/03/20 0500 08/03/20 0800 08/03/20 0850  BP:      Pulse:      Resp:      Temp: 98.5 F (36.9 C) 98.3 F (36.8 C) (!) 97.5 F (36.4 C)   TempSrc: Oral Oral Axillary   SpO2:    99%  Weight:  135.4 kg    Height:       No intake or output data in the 24 hours ending 08/03/20 0946 Last 3 Weights 08/03/2020 08/02/2020 08/01/2020  Weight (lbs) 298 lb 8.1 oz 299 lb 6.2 oz 299 lb 6.2 oz  Weight (kg)  135.4 kg 135.8 kg 135.8 kg      Telemetry    NSR - Personally Reviewed  ECG    N/a - Personally Reviewed  Physical Exam   GEN: No acute distress.   Neck: No JVD Cardiac: RRR, no murmurs, rubs, or gallops.  Respiratory: Clear to auscultation bilaterally. GI: Soft, nontender, non-distended  MS: No edema; No deformity. Neuro:  Nonfocal  Psych: Normal affect   Labs    High Sensitivity Troponin:   Recent Labs  Lab 08/01/20 0346 08/01/20 0520  TROPONINIHS 11 9      Chemistry Recent Labs  Lab 08/01/20 0346 08/02/20 0428 08/03/20 0349  NA 137 135 137  K 3.9 3.9 3.8  CL 93* 96* 97*  CO2 28 28 29   GLUCOSE 74 166* 124*  BUN 23* 33* 42*  CREATININE 1.10 1.12 1.19  CALCIUM 9.6 9.2 9.1  PROT 7.9  --   --   ALBUMIN 3.8  --   --   AST 27  --   --   ALT 20  --   --   ALKPHOS 108  --   --   BILITOT 0.7  --   --   GFRNONAA >60 >60 >60  GFRAA >60 >60 >  [place]     Hematology Recent Labs  Lab 08/01/20 0346 08/02/20 0428 08/03/20 0349  WBC 16.7* 17.5* 15.2*  RBC 4.23 3.77* 3.95*  HGB 10.9* 9.8* 10.2*  HCT 36.4* 32.1* 34.5*  MCV 86.1 85.1 87.3  MCH 25.8* 26.0 25.8*  MCHC 29.9* 30.5 29.6*  RDW 16.3* 16.2* 16.3*  PLT 471* 446* 415*    BNP Recent Labs  Lab 08/01/20 0346  BNP 141.0*     DDimer No results for input(s): DDIMER in the last 168 hours.   Radiology    ECHOCARDIOGRAM LIMITED  Result Date: 08/03/2020    ECHOCARDIOGRAM LIMITED REPORT   Patient Name:   Shane Chambers Date of Exam: 08/03/2020 Medical Rec #:  [id number]       Height:       69.0 in Accession #:    [id number]      Weight:       298.5 lb Date of Birth:  1961/09/01       BSA:          2.449 m Patient Age:    26 years        BP:           143/73 mmHg Patient Gender: M               HR:           81 bpm. Exam Location:  [name] Procedure: 2D Echo and Intracardiac Opacification Agent Indications:    Dyspnea 786.09 / R06.00  History:        Patient has prior history of  Echocardiogram examinations, most                 recent 04/30/2020. CHF, CAD, Prior CABG; Risk Factors:Former                 Smoker, Dyslipidemia, Diabetes and Hypertension.  Sonographer:    [name] RDCS (AE) Referring Phys: [id number] [place]  1. Left ventricular ejection fraction, by estimation, is 55 to 60%. The left ventricle has normal function. The left ventricle has no regional wall motion abnormalities. There is mild left ventricular hypertrophy. Left ventricular diastolic parameters are consistent with Grade II diastolic dysfunction (pseudonormalization). Elevated left atrial pressure.  2. There is moderate calcification of the aortic valve. There is moderate thickening of the aortic valve.  3. Limited echo to evaluate LV function FINDINGS  Left Ventricle: Left ventricular ejection fraction, by estimation, is 55 to 60%. The left ventricle has normal function. The left ventricle has no regional wall motion abnormalities. Definity contrast agent was given IV to delineate the left ventricular  endocardial borders. The left ventricular internal cavity size was normal in size. There is mild left ventricular hypertrophy. Left ventricular diastolic parameters are consistent with Grade II diastolic dysfunction (pseudonormalization). Elevated left atrial pressure. Aortic Valve: There is moderate calcification of the aortic valve. There is moderate thickening of the aortic valve. There is moderate aortic valve annular calcification. Aortic valve mean gradient measures 8.1 mmHg. Aortic valve peak gradient measures 13.0 mmHg. Aortic valve area, by VTI measures 1.67 cm. LEFT VENTRICLE PLAX 2D LVIDd:         5.20 cm  Diastology LVIDs:         3.71 cm  LV e' medial:    4.47 cm/s LV PW:         1.30 cm  LV E/e' medial:  23.9 LV IVS:  1.10 cm  LV e' lateral:   9.99 cm/s LVOT diam:     2.10 cm  LV E/e' lateral: 10.7 LV SV:         72 LV SV Index:   30 LVOT Area:     3.46 cm  LEFT ATRIUM          Index LA diam:    5.40 cm 2.20 cm/m  AORTIC VALVE AV Area (Vmax):    1.59 cm AV Area (Vmean):   1.63 cm AV Area (VTI):     1.67 cm AV Vmax:           180.35 cm/s AV Vmean:          137.247 cm/s AV VTI:            0.435 m AV Peak Grad:      13.0 mmHg AV Mean Grad:      8.1 mmHg LVOT Vmax:         82.69 cm/s LVOT Vmean:        64.586 cm/s LVOT VTI:          0.209 m LVOT/AV VTI ratio: 0.48  AORTA Ao Root diam: 3.30 cm MITRAL VALVE MV Area (PHT): 4.52 cm     SHUNTS MV Decel Time: 168 msec     Systemic VTI:  0.21 m MV E velocity: 107.00 cm/s  Systemic Diam: 2.10 cm MV A velocity: 58.30 cm/s MV E/A ratio:  1.84 Carlyle Dolly MD Electronically signed by Carlyle Dolly MD Signature Date/Time: 08/03/2020/9:46:14 AM    Final     Cardiac Studies    Patient Profile     [name] a 59 y.o.malewith past medical history of CAD (s/p CABG in 03/2020 with LIMA-LAD, RIMA-PL, [id number]), chronic systolic CHF (EF 44-03% by echo in 02/2020, at 40% by echo in 03/2020, normalized to 60-65% by echo in 04/2020), HTN, HLD,Type 2 DMand OSAwho is being seen today for the evaluation of CHFat the request ofDr. Manuella Ghazi.    Assessment & Plan    1. Acute on chronic diastolic HF - 02/7424 Echo: LVEF 60-65%, mod LVH, grade II DDx, normal RV function - 07/2020 limited echo LVEF 655-60%, grade II DDx (unchanged) - exacerbation secondary to dietary noncompliance  - no data from nursing for I/Os yesterday. Down 1 lbs from yesterday -  He is on IV lasix 40mg  bid. Slight uptrend in Cr with diuresis.    2.CAD - He is s/p CABG in 03/2020 with LIMA-LAD, RIMA-PL, [id number]. - HS Troponin values have been negative this admission and EKG is without acute ischemic changes.  - CABG in setting of NSTEMI, committed to 1 year of DAPT.  3. Pleural effusion - s/p thoracentesis this admission, removed 163mL of bloody fluid  4. Leukocytosis - per primary team  5. Hyperlpidemia LDL not at goal of <70,  changed atorva 80 to crestor 40 this admit    For questions or updates, please contact [place] Please consult [email or web address] for contact info under        Signed, Carlyle Dolly, MD  08/03/2020, 9:46 AM

## 2020-08-03 NOTE — Progress Notes (Signed)
PROGRESS NOTE    Shane Chambers  NKN:[id number] DOB: 1961-04-09 DOA: 08/01/2020 PCP: [name], MD   Brief Narrative:  Per HPI: Shane Chambers a 59 y.o.malewith medical history significant forCAD with CABG on 01/7901, grade 2 diastolic CHF with LVEF 40-97% on echo 04/2020, hypertension, dyslipidemia, type 2 diabetes, GERD, BPH, obesity, mood disorder, and iron deficiency anemia who presented to the ED with worsening shortness of breath. He states that his shortness of breath has worsened over the past week and has had swelling to his bilateral lower extremities. He denies any chest pain, fevers, or chills.He states that his baseline weight is typically 280 pounds and he has come up to approximately 298 pounds. He does have some orthopnea noted and also a dry cough, but no wheezing or chest tightness. He states that he has some baseline shortness of breath with exertion that is not unusual for him. He does not wear a CPAP mask at night or any other oxygen supplementation. He has recently completed both of his Covid vaccinations within the last 2 weeks. Mother on phonestates that Shane Chambers has been drinking quite a bit of fluid. Shane Chambers also states that he tends to eat a significant amount of boxed/processedfoods. He is apparently very compliant with his Lasix use and states that he may have missed 1 day in the last 2 weeks.  9/23: Shane Chambers has been admitted with acute on chronic diastolic CHF and has been diuresing quite well.  Plans to continue current diuresis as ordered per cardiology and check 2D echocardiogram today.  9/24: Plans to continue ongoing diuresis with improvement in shortness of breath noted.  2D echocardiogram similar to prior.  Assessment & Plan:   Active Problems:   CHF exacerbation (HCC)   Acute hypoxemic respiratory failure secondary to acute on chronic diastolic congestive heart failure with moderate left pleural effusion -Likely related to  dietary/fluid restriction noncompliance -Recent 2D echocardiogram on 04/2020 with LVEF 60-65% and grade 2 diastolic CHF, repeat 2D echocardiogram similar on 9/24 -Status post thoracentesis 9/22 with 120 cc removed -Continue Lasix as ordered 40 mg IV twice daily with 1 pound decrease over 24 hours -Fluid restriction, strict input and output, daily weights -Wean oxygen as tolerated  QuestionableCOPD -Being evaluated by pulmonology, Dr. Elsworth Soho in outpatient setting -Prior history of tobacco abuse -Plan to order breathing treatments as needed -No significant bronchospasms currently noted, therefore we will avoid steroids especially in setting of diabetes  CAD with prior CABG 03/2020 -Continue dual antiplatelet therapy with aspirin and Plavix -Continue statin now with Crestor 40 mg -Hold beta-blocker given soft blood pressure readings -No sign of ACS currently noted  Type 2 diabetes with polyneuropathy-stable -Hold home Metformin -Continue home 70/30 insulin -Diabetic diet -SSI -Hold Metformin  History of hypertension-stabilized -Resume home metoprolol and hold losartan while on Lasix for diuresis -Monitor carefully on aggressive IV diuresis  Iron deficiency anemia -Currently appears stable -Continue monitoring -Continue home iron supplementation and stool softener -No overt bleeding noted  Mood disorder -Continue venlafaxine  BPH -Continue Flomax  GERD -Continue PPI  Obesity with possible OSA -Lifestyle changes -He still needs sleep study outpatient and does not wear CPAP at night   DVT prophylaxis:Heparin Code Status: Full Family Communication: None at bedside Disposition Plan:  Status is: Inpatient  Remains inpatient appropriate because:IV treatments appropriate due to intensity of illness or inability to take PO and Inpatient level of care appropriate due to severity of illness   Dispo: The Shane Chambers is from: Home  Anticipated d/c is to:  Home  Anticipated d/c date is: 2-3 days  Shane Chambers currently is not medically stable to d/c.  Shane Chambers continues to require IV diuresis for ongoing treatment of his heart failure.   Consultants:   Cardiology  Procedures:   Left-sided thoracentesis on 9/22 with 120 cc removed  See below  Antimicrobials:   None   Subjective: Shane Chambers seen and evaluated today and dyspnea is slowly improving. He didn't sleep well overnight.  Objective: Vitals:   08/03/20 0800 08/03/20 0850 08/03/20 0900 08/03/20 1027  BP:    (!) 146/68  Pulse:   77   Resp:   17 18  Temp: (!) 97.5 F (36.4 C)     TempSrc: Axillary     SpO2:  99% 96%   Weight:      Height:        Intake/Output Summary (Last 24 hours) at 08/03/2020 1236 Last data filed at 08/03/2020 1059 Gross per 24 hour  Intake 120 ml  Output 600 ml  Net -480 ml   Filed Weights   08/01/20 2015 08/02/20 0500 08/03/20 0500  Weight: 135.8 kg 135.8 kg 135.4 kg    Examination:  General exam: Appears calm and comfortable  Respiratory system: Clear to auscultation. Respiratory effort normal. Currently on 2L [place]. Cardiovascular system: S1 & S2 heard, RRR.  Gastrointestinal system: Abdomen is nondistended, soft and nontender.  Central nervous system: Alert and oriented.  Extremities: Ongoing scant edema bilaterally. Skin: No rashes, lesions or ulcers Psychiatry: Judgement and insight appear normal. Mood & affect appropriate.     Data Reviewed: I have personally reviewed following labs and imaging studies  CBC: Recent Labs  Lab 08/01/20 0346 08/02/20 0428 08/03/20 0349  WBC 16.7* 17.5* 15.2*  NEUTROABS 11.2*  --   --   HGB 10.9* 9.8* 10.2*  HCT 36.4* 32.1* 34.5*  MCV 86.1 85.1 87.3  PLT 471* 446* 696*   Basic Metabolic Panel: Recent Labs  Lab 08/01/20 0346 08/02/20 0428 08/03/20 0349  NA 137 135 137  K 3.9 3.9 3.8  CL 93* 96* 97*  CO2 28 28 29   GLUCOSE 74 166* 124*  BUN 23* 33* 42*    CREATININE 1.10 1.12 1.19  CALCIUM 9.6 9.2 9.1  MG  --  2.0 2.0   GFR: Estimated Creatinine Clearance: 93.6 mL/min (by C-G formula based on SCr of 1.19 mg/dL). Liver Function Tests: Recent Labs  Lab 08/01/20 0346  AST 27  ALT 20  ALKPHOS 108  BILITOT 0.7  PROT 7.9  ALBUMIN 3.8   No results for input(s): LIPASE, AMYLASE in the last 168 hours. No results for input(s): AMMONIA in the last 168 hours. Coagulation Profile: No results for input(s): INR, PROTIME in the last 168 hours. Cardiac Enzymes: No results for input(s): CKTOTAL, CKMB, CKMBINDEX, TROPONINI in the last 168 hours. BNP (last 3 results) No results for input(s): PROBNP in the last 8760 hours. HbA1C: Recent Labs    08/01/20 0346  HGBA1C 7.3*   CBG: Recent Labs  Lab 08/02/20 1202 08/02/20 1628 08/02/20 2122 08/03/20 0743 08/03/20 1157  GLUCAP 180* 148* 87 134* 196*   Lipid Profile: Recent Labs    08/02/20 0428  CHOL 140  HDL 41  LDLCALC 82  TRIG 85  CHOLHDL 3.4   Thyroid Function Tests: No results for input(s): TSH, T4TOTAL, FREET4, T3FREE, THYROIDAB in the last 72 hours. Anemia Panel: No results for input(s): VITAMINB12, FOLATE, FERRITIN, TIBC, IRON, RETICCTPCT in the last 72 hours. Sepsis  Labs: No results for input(s): PROCALCITON, LATICACIDVEN in the last 168 hours.  Recent Results (from the past 240 hour(s))  SARS Coronavirus 2 by RT PCR (hospital order, performed in [facility] hospital lab) Nasopharyngeal Nasopharyngeal Swab     Status: None   Collection Time: 08/01/20  3:42 AM   Specimen: Nasopharyngeal Swab  Result Value Ref Range Status   SARS Coronavirus 2 NEGATIVE NEGATIVE Final    Comment: (NOTE) SARS-CoV-2 target nucleic acids are NOT DETECTED.  The SARS-CoV-2 RNA is generally detectable in upper and lower respiratory specimens during the acute phase of infection. The lowest concentration of SARS-CoV-2 viral copies this assay can detect is 250 copies / mL. A negative result  does not preclude SARS-CoV-2 infection and should not be used as the sole basis for treatment or other Shane Chambers management decisions.  A negative result may occur with improper specimen collection / handling, submission of specimen other than nasopharyngeal swab, presence of viral mutation(s) within the areas targeted by this assay, and inadequate number of viral copies (<250 copies / mL). A negative result must be combined with clinical observations, Shane Chambers history, and epidemiological information.  Fact Sheet for Patients:   StrictlyIdeas.no  Fact Sheet for Healthcare Providers: BankingDealers.co.za  This test is not yet approved or  cleared by the [place] FDA and has been authorized for detection and/or diagnosis of SARS-CoV-2 by FDA under an Emergency Use Authorization (EUA).  This EUA will remain in effect (meaning this test can be used) for the duration of the COVID-19 declaration under Section 564(b)(1) of the Act, 21 U.S.C. section 360bbb-3(b)(1), unless the authorization is terminated or revoked sooner.  Performed at [facility], [address], [place], [place] [address]   Gram stain     Status: None   Collection Time: 08/01/20  9:07 AM   Specimen: Pleura; Body Fluid  Result Value Ref Range Status   Specimen Description PLEURAL  Final   Special Requests NONE  Final   Gram Stain   Final    CYTOSPIN SMEAR NO ORGANISMS SEEN WBC PRESENT,BOTH PMN AND MONONUCLEAR Performed at [facility], [address] [place], [place] [address]    Report Status 08/01/2020 FINAL  Final  Culture, body fluid-bottle     Status: None (Preliminary result)   Collection Time: 08/01/20  9:07 AM   Specimen: Pleura  Result Value Ref Range Status   Specimen Description PLEURAL  Final   Special Requests NONE  Final   Culture   Final    NO GROWTH 2 DAYS Performed at [facility], [address], [place], [place] [address]    Report Status  PENDING  Incomplete  MRSA PCR Screening     Status: Abnormal   Collection Time: 08/02/20  5:00 AM   Specimen: Nasal Mucosa; Nasopharyngeal  Result Value Ref Range Status   MRSA by PCR POSITIVE (A) NEGATIVE Final    Comment:        The GeneXpert MRSA Assay (FDA approved for NASAL specimens only), is one component of a comprehensive MRSA colonization surveillance program. It is not intended to diagnose MRSA infection nor to guide or monitor treatment for MRSA infections. RESULT CALLED TO, READ BACK BY AND VERIFIED WITH: DAVIS,[email or web address]  BY MATTHEWS, B 9.23.2021 Performed at [facility], [address] [place], [place] [address]          Radiology Studies: ECHOCARDIOGRAM LIMITED  Result Date: 08/03/2020    ECHOCARDIOGRAM LIMITED REPORT   Shane Chambers Name:   LINDWOOD MOGEL  Date of Exam: 08/03/2020 Medical Rec #:  [id number]       Height:       69.0 in Accession #:    [id number]      Weight:       298.5 lb Date of Birth:  April 15, 1961       BSA:          2.449 m Shane Chambers Age:    50 years        BP:           143/73 mmHg Shane Chambers Gender: M               HR:           81 bpm. Exam Location:  [name] Procedure: 2D Echo and Intracardiac Opacification Agent Indications:    Dyspnea 786.09 / R06.00  History:        Shane Chambers has prior history of Echocardiogram examinations, most                 recent 04/30/2020. CHF, CAD, Prior CABG; Risk Factors:Former                 Smoker, Dyslipidemia, Diabetes and Hypertension.  Sonographer:    [name] RDCS (AE) Referring Phys: [id number] [place]  1. Left ventricular ejection fraction, by estimation, is 55 to 60%. The left ventricle has normal function. The left ventricle has no regional wall motion abnormalities. There is mild left ventricular hypertrophy. Left ventricular diastolic parameters are consistent with Grade II diastolic dysfunction (pseudonormalization). Elevated left atrial pressure.  2. There is moderate calcification of the  aortic valve. There is moderate thickening of the aortic valve.  3. Limited echo to evaluate LV function FINDINGS  Left Ventricle: Left ventricular ejection fraction, by estimation, is 55 to 60%. The left ventricle has normal function. The left ventricle has no regional wall motion abnormalities. Definity contrast agent was given IV to delineate the left ventricular  endocardial borders. The left ventricular internal cavity size was normal in size. There is mild left ventricular hypertrophy. Left ventricular diastolic parameters are consistent with Grade II diastolic dysfunction (pseudonormalization). Elevated left atrial pressure. Aortic Valve: There is moderate calcification of the aortic valve. There is moderate thickening of the aortic valve. There is moderate aortic valve annular calcification. Aortic valve mean gradient measures 8.1 mmHg. Aortic valve peak gradient measures 13.0 mmHg. Aortic valve area, by VTI measures 1.67 cm. LEFT VENTRICLE PLAX 2D LVIDd:         5.20 cm  Diastology LVIDs:         3.71 cm  LV e' medial:    4.47 cm/s LV PW:         1.30 cm  LV E/e' medial:  23.9 LV IVS:        1.10 cm  LV e' lateral:   9.99 cm/s LVOT diam:     2.10 cm  LV E/e' lateral: 10.7 LV SV:         72 LV SV Index:   30 LVOT Area:     3.46 cm  LEFT ATRIUM         Index LA diam:    5.40 cm 2.20 cm/m  AORTIC VALVE AV Area (Vmax):    1.59 cm AV Area (Vmean):   1.63 cm AV Area (VTI):     1.67 cm AV Vmax:           180.35 cm/s AV Vmean:  137.247 cm/s AV VTI:            0.435 m AV Peak Grad:      13.0 mmHg AV Mean Grad:      8.1 mmHg LVOT Vmax:         82.69 cm/s LVOT Vmean:        64.586 cm/s LVOT VTI:          0.209 m LVOT/AV VTI ratio: 0.48  AORTA Ao Root diam: 3.30 cm MITRAL VALVE MV Area (PHT): 4.52 cm     SHUNTS MV Decel Time: 168 msec     Systemic VTI:  0.21 m MV E velocity: 107.00 cm/s  Systemic Diam: 2.10 cm MV A velocity: 58.30 cm/s MV E/A ratio:  1.84 Carlyle Dolly MD Electronically signed by  Carlyle Dolly MD Signature Date/Time: 08/03/2020/9:46:14 AM    Final         Scheduled Meds: . aspirin EC  81 mg Oral Daily  . Chlorhexidine Gluconate Cloth  6 each Topical Daily  . clopidogrel  75 mg Oral Daily  . docusate sodium  100 mg Oral BID  . ferrous sulfate  325 mg Oral QODAY  . furosemide  40 mg Intravenous BID  . gabapentin  900 mg Oral BID  . heparin  5,000 Units Subcutaneous Q8H  . insulin aspart  0-15 Units Subcutaneous TID WC  . insulin aspart  0-5 Units Subcutaneous QHS  . insulin aspart  3 Units Subcutaneous TID WC  . insulin aspart protamine- aspart  55 Units Subcutaneous BID WC  . losartan  12.5 mg Oral Daily  . metoprolol succinate  25 mg Oral BID  . mometasone-formoterol  2 puff Inhalation BID  . mupirocin ointment  1 application Nasal BID  . pantoprazole  40 mg Oral Daily  . potassium chloride SA  20 mEq Oral Daily  . rosuvastatin  40 mg Oral QHS  . sodium chloride flush  3 mL Intravenous Q12H  . venlafaxine XR  150 mg Oral Q breakfast   Continuous Infusions: . sodium chloride       LOS: 2 days    Time spent: 30 minutes    Jaquavious Mercer Darleen Crocker, DO [facility]  If 7PM-7AM, please contact night-coverage [email or web address] 08/03/2020, 12:36 PM

## 2020-08-03 NOTE — Plan of Care (Signed)
  Problem: Acute Rehab PT Goals(only PT should resolve) Goal: Pt Will Go Supine/Side To Sit Outcome: Progressing Flowsheets (Taken 08/03/2020 1451) Pt will go Supine/Side to Sit: with min guard assist Goal: Pt Will Go Sit To Supine/Side Outcome: Progressing Flowsheets (Taken 08/03/2020 1451) Pt will go Sit to Supine/Side: with min guard assist Goal: Patient Will Transfer Sit To/From Stand Outcome: Progressing Flowsheets (Taken 08/03/2020 1451) Patient will transfer sit to/from stand: with supervision Goal: Pt Will Transfer Bed To Chair/Chair To Bed Outcome: Progressing Flowsheets (Taken 08/03/2020 1451) Pt will Transfer Bed to Chair/Chair to Bed: with supervision Goal: Pt/caregiver will Perform Home Exercise Program Outcome: Progressing Flowsheets (Taken 08/03/2020 1451) Pt/caregiver will Perform Home Exercise Program:  For increased strengthening  For improved balance  Independently  2:52 PM, 08/03/20 Mearl Latin PT, DPT Physical Therapist at [facility]

## 2020-08-03 NOTE — Progress Notes (Signed)
*  PRELIMINARY RESULTS* Echocardiogram 2D Echocardiogram limited with definity has been performed.  [name] 08/03/2020, 8:56 AM

## 2020-08-04 LAB — CBC
HCT: 34.7 % — ABNORMAL LOW (ref 39.0–52.0)
Hemoglobin: 10.5 g/dL — ABNORMAL LOW (ref 13.0–17.0)
MCH: 25.8 pg — ABNORMAL LOW (ref 26.0–34.0)
MCHC: 30.3 g/dL (ref 30.0–36.0)
MCV: 85.3 fL (ref 80.0–100.0)
Platelets: 487 10*3/uL — ABNORMAL HIGH (ref 150–400)
RBC: 4.07 MIL/uL — ABNORMAL LOW (ref 4.22–5.81)
RDW: 16.1 % — ABNORMAL HIGH (ref 11.5–15.5)
WBC: 14.8 10*3/uL — ABNORMAL HIGH (ref 4.0–10.5)
nRBC: 0 % (ref 0.0–0.2)

## 2020-08-04 LAB — BASIC METABOLIC PANEL
Anion gap: 13 (ref 5–15)
BUN: 39 mg/dL — ABNORMAL HIGH (ref 6–20)
CO2: 29 mmol/L (ref 22–32)
Calcium: 9.4 mg/dL (ref 8.9–10.3)
Chloride: 95 mmol/L — ABNORMAL LOW (ref 98–111)
Creatinine, Ser: 1.06 mg/dL (ref 0.61–1.24)
GFR calc Af Amer: >60 mL/min (ref >60)
GFR calc non Af Amer: >60 mL/min (ref >60)
Glucose, Bld: 109 mg/dL — ABNORMAL HIGH (ref 70–99)
Potassium: 3.8 mmol/L (ref 3.5–5.1)
Sodium: 137 mmol/L (ref 135–145)

## 2020-08-04 LAB — GLUCOSE, CAPILLARY
Glucose-Capillary: 112 mg/dL — ABNORMAL HIGH (ref 70–99)
Glucose-Capillary: 176 mg/dL — ABNORMAL HIGH (ref 70–99)
Glucose-Capillary: 155 mg/dL — ABNORMAL HIGH (ref 70–99)
Glucose-Capillary: 117 mg/dL — ABNORMAL HIGH (ref 70–99)

## 2020-08-04 LAB — MAGNESIUM: Magnesium: 2 mg/dL (ref 1.7–2.4)

## 2020-08-04 NOTE — Progress Notes (Signed)
PROGRESS NOTE    Shane Chambers  IEP:[id number] DOB: 1961-07-24 DOA: 08/01/2020 PCP: [name], MD   Brief Narrative:  Per HPI: Shane Chambers a 59 y.o.malewith medical history significant forCAD with CABG on 04/6062, grade 2 diastolic CHF with LVEF 01-60% on echo 04/2020, hypertension, dyslipidemia, type 2 diabetes, GERD, BPH, obesity, mood disorder, and iron deficiency anemia who presented to the ED with worsening shortness of breath. He states that his shortness of breath has worsened over the past week and has had swelling to his bilateral lower extremities. He denies any chest pain, fevers, or chills.He states that his baseline weight is typically 280 pounds and he has come up to approximately 298 pounds. He does have some orthopnea noted and also a dry cough, but no wheezing or chest tightness. He states that he has some baseline shortness of breath with exertion that is not unusual for him. He does not wear a CPAP mask at night or any other oxygen supplementation. He has recently completed both of his Covid vaccinations within the last 2 weeks. Mother on phonestates that patient has been drinking quite a bit of fluid. Patient also states that he tends to eat a significant amount of boxed/processedfoods. He is apparently very compliant with his Lasix use and states that he may have missed 1 day in the last 2 weeks.  9/23:Patient has been admitted with acute on chronic diastolic CHF and has been diuresing quite well. Plans to continue current diuresis as ordered per cardiology and check 2D echocardiogram today.  9/24: Plans to continue ongoing diuresis with improvement in shortness of breath noted.  2D echocardiogram similar to prior.  9/25: Patient continues to have ongoing significant diuresis with stable renal function.  -3.5 L output documented overnight.  Weight is currently 292 pounds.  Assessment & Plan:   Active Problems:   CHF exacerbation (HCC)   Acute  hypoxemic respiratory failure secondary to acute on chronic diastolic congestive heart failure with moderate left pleural effusion -Likely related to dietary/fluid restriction noncompliance -Recent 2D echocardiogram on 04/2020 with LVEF 60-65% and grade 2 diastolic CHF, repeat 2D echocardiogram similar on 9/24 -Status post thoracentesis 9/22 with 120 cc removed -ContinueLasix as ordered 40 mg IV twice daily with  -3.5 L output over 24 hours. -Fluid restriction, strict input and output, daily weights -Wean oxygen as tolerated  QuestionableCOPD -Being evaluated by pulmonology, Dr. Elsworth Soho in outpatient setting -Prior history of tobacco abuse -Plan to order breathing treatments as needed -No significant bronchospasms currently noted, therefore we will avoid steroids especially in setting of diabetes  CAD with prior CABG 03/2020 -Continue dual antiplatelet therapy with aspirin and Plavix -Continue statinnow with Crestor 40 mg -Hold beta-blocker given soft blood pressure readings -No sign of ACS currently noted  Type 2 diabetes with polyneuropathy-stable -Hold home Metformin -Continue home 70/30 insulin -Diabetic diet -SSI -Hold Metformin  History of hypertension-stabilized -Resume home metoprolol and hold losartan while on Lasix for diuresis -Monitor carefullyon aggressive IV diuresis  Iron deficiency anemia -Currently appears stable -Continue monitoring -Continue home iron supplementation and stool softener -No overt bleeding noted  Mood disorder -Continue venlafaxine  BPH -Continue Flomax  GERD -Continue PPI  Obesity with possible OSA -Lifestyle changes -He still needs sleep study outpatient and does not wear CPAP at night   DVT prophylaxis:Heparin Code Status:Full Family Communication:None at bedside Disposition Plan: Status is: Inpatient  Remains inpatient appropriate because:IV treatments appropriate due to intensity of illness or inability to  take PO  and Inpatient level of care appropriate due to severity of illness   Dispo: The patient is from:Home Anticipated d/c is to:SNF Anticipated d/c date is: 2-3 days Patient currently is not medically stable to d/c.Patient continues to require IV diuresis for ongoing treatment of his heart failure.   Consultants:  Cardiology  Procedures:  Left-sided thoracentesis on 9/22 with 120 cc removed  See below  Antimicrobials:   None  Subjective: Patient seen and evaluated today with no new acute complaints or concerns. No acute concerns or events noted overnight.  He states that he is starting to feel somewhat better and has less shortness of breath.  He is still not quite at baseline.  Objective: Vitals:   08/04/20 0600 08/04/20 0700 08/04/20 0818 08/04/20 0819  BP: (!) 144/74     Pulse: 73 70 75   Resp: 11 19 16    Temp: (!) 97.5 F (36.4 C)  98 F (36.7 C)   TempSrc: Axillary  Oral   SpO2: 96% 96% 98% 96%  Weight:      Height:        Intake/Output Summary (Last 24 hours) at 08/04/2020 0858 Last data filed at 08/04/2020 0829 Gross per 24 hour  Intake 240 ml  Output 4090 ml  Net -3850 ml   Filed Weights   08/02/20 0500 08/03/20 0500 08/04/20 0500  Weight: 135.8 kg 135.4 kg 132.9 kg    Examination:  General exam: Appears calm and comfortable  Respiratory system: Clear to auscultation. Respiratory effort normal.  Currently on room air Cardiovascular system: S1 & S2 heard, RRR.  Gastrointestinal system: Abdomen is nondistended, soft and nontender. Central nervous system: Alert and oriented. No focal neurological deficits. Extremities: 1-2+ pedal edema bilaterally up to mid shin Skin: No rashes, lesions or ulcers Psychiatry: Judgement and insight appear normal. Mood & affect appropriate.     Data Reviewed: I have personally reviewed following labs and imaging studies  CBC: Recent Labs  Lab 08/01/20 0346  08/02/20 0428 08/03/20 0349 08/04/20 0459  WBC 16.7* 17.5* 15.2* 14.8*  NEUTROABS 11.2*  --   --   --   HGB 10.9* 9.8* 10.2* 10.5*  HCT 36.4* 32.1* 34.5* 34.7*  MCV 86.1 85.1 87.3 85.3  PLT 471* 446* 415* 165*   Basic Metabolic Panel: Recent Labs  Lab 08/01/20 0346 08/02/20 0428 08/03/20 0349 08/04/20 0459  NA 137 135 137 137  K 3.9 3.9 3.8 3.8  CL 93* 96* 97* 95*  CO2 28 28 29 29   GLUCOSE 74 166* 124* 109*  BUN 23* 33* 42* 39*  CREATININE 1.10 1.12 1.19 1.06  CALCIUM 9.6 9.2 9.1 9.4  MG  --  2.0 2.0 2.0   GFR: Estimated Creatinine Clearance: 104 mL/min (by C-G formula based on SCr of 1.06 mg/dL). Liver Function Tests: Recent Labs  Lab 08/01/20 0346  AST 27  ALT 20  ALKPHOS 108  BILITOT 0.7  PROT 7.9  ALBUMIN 3.8   No results for input(s): LIPASE, AMYLASE in the last 168 hours. No results for input(s): AMMONIA in the last 168 hours. Coagulation Profile: No results for input(s): INR, PROTIME in the last 168 hours. Cardiac Enzymes: No results for input(s): CKTOTAL, CKMB, CKMBINDEX, TROPONINI in the last 168 hours. BNP (last 3 results) No results for input(s): PROBNP in the last 8760 hours. HbA1C: No results for input(s): HGBA1C in the last 72 hours. CBG: Recent Labs  Lab 08/03/20 0743 08/03/20 1157 08/03/20 1722 08/03/20 2045 08/04/20 0816  GLUCAP 134* 196*  79 106* 112*   Lipid Profile: Recent Labs    08/02/20 0428  CHOL 140  HDL 41  LDLCALC 82  TRIG 85  CHOLHDL 3.4   Thyroid Function Tests: No results for input(s): TSH, T4TOTAL, FREET4, T3FREE, THYROIDAB in the last 72 hours. Anemia Panel: No results for input(s): VITAMINB12, FOLATE, FERRITIN, TIBC, IRON, RETICCTPCT in the last 72 hours. Sepsis Labs: No results for input(s): PROCALCITON, LATICACIDVEN in the last 168 hours.  Recent Results (from the past 240 hour(s))  SARS Coronavirus 2 by RT PCR (hospital order, performed in [facility] hospital lab) Nasopharyngeal Nasopharyngeal Swab      Status: None   Collection Time: 08/01/20  3:42 AM   Specimen: Nasopharyngeal Swab  Result Value Ref Range Status   SARS Coronavirus 2 NEGATIVE NEGATIVE Final    Comment: (NOTE) SARS-CoV-2 target nucleic acids are NOT DETECTED.  The SARS-CoV-2 RNA is generally detectable in upper and lower respiratory specimens during the acute phase of infection. The lowest concentration of SARS-CoV-2 viral copies this assay can detect is 250 copies / mL. A negative result does not preclude SARS-CoV-2 infection and should not be used as the sole basis for treatment or other patient management decisions.  A negative result may occur with improper specimen collection / handling, submission of specimen other than nasopharyngeal swab, presence of viral mutation(s) within the areas targeted by this assay, and inadequate number of viral copies (<250 copies / mL). A negative result must be combined with clinical observations, patient history, and epidemiological information.  Fact Sheet for Patients:   StrictlyIdeas.no  Fact Sheet for Healthcare Providers: BankingDealers.co.za  This test is not yet approved or  cleared by the [place] FDA and has been authorized for detection and/or diagnosis of SARS-CoV-2 by FDA under an Emergency Use Authorization (EUA).  This EUA will remain in effect (meaning this test can be used) for the duration of the COVID-19 declaration under Section 564(b)(1) of the Act, 21 U.S.C. section 360bbb-3(b)(1), unless the authorization is terminated or revoked sooner.  Performed at [facility], [address], [place], [place] [address]   Gram stain     Status: None   Collection Time: 08/01/20  9:07 AM   Specimen: Pleura; Body Fluid  Result Value Ref Range Status   Specimen Description PLEURAL  Final   Special Requests NONE  Final   Gram Stain   Final    CYTOSPIN SMEAR NO ORGANISMS SEEN WBC PRESENT,BOTH PMN AND  MONONUCLEAR Performed at [facility], [address] [place], [place] [address]    Report Status 08/01/2020 FINAL  Final  Culture, body fluid-bottle     Status: None (Preliminary result)   Collection Time: 08/01/20  9:07 AM   Specimen: Pleura  Result Value Ref Range Status   Specimen Description PLEURAL  Final   Special Requests NONE  Final   Culture   Final    NO GROWTH 3 DAYS Performed at [facility], [address] [place], [place] [address]    Report Status PENDING  Incomplete  MRSA PCR Screening     Status: Abnormal   Collection Time: 08/02/20  5:00 AM   Specimen: Nasal Mucosa; Nasopharyngeal  Result Value Ref Range Status   MRSA by PCR POSITIVE (A) NEGATIVE Final    Comment:        The GeneXpert MRSA Assay (FDA approved for NASAL specimens only), is one component of a comprehensive MRSA colonization surveillance program. It is not intended to diagnose MRSA infection  nor to guide or monitor treatment for MRSA infections. RESULT CALLED TO, READ BACK BY AND VERIFIED WITH: DAVIS,[email or web address]  BY MATTHEWS, B 9.23.2021 Performed at [facility], [address], [place], [place] [address]          Radiology Studies: ECHOCARDIOGRAM LIMITED  Result Date: 08/03/2020    ECHOCARDIOGRAM LIMITED REPORT   Patient Name:   Shane Chambers Date of Exam: 08/03/2020 Medical Rec #:  [id number]       Height:       69.0 in Accession #:    [id number]      Weight:       298.5 lb Date of Birth:  28-Dec-1960       BSA:          2.449 m Patient Age:    88 years        BP:           143/73 mmHg Patient Gender: M               HR:           81 bpm. Exam Location:  [name] Procedure: 2D Echo and Intracardiac Opacification Agent Indications:    Dyspnea 786.09 / R06.00  History:        Patient has prior history of Echocardiogram examinations, most                 recent 04/30/2020. CHF, CAD, Prior CABG; Risk Factors:Former                 Smoker, Dyslipidemia, Diabetes and Hypertension.  Sonographer:     [name] RDCS (AE) Referring Phys: [id number] [place]  1. Left ventricular ejection fraction, by estimation, is 55 to 60%. The left ventricle has normal function. The left ventricle has no regional wall motion abnormalities. There is mild left ventricular hypertrophy. Left ventricular diastolic parameters are consistent with Grade II diastolic dysfunction (pseudonormalization). Elevated left atrial pressure.  2. There is moderate calcification of the aortic valve. There is moderate thickening of the aortic valve.  3. Limited echo to evaluate LV function FINDINGS  Left Ventricle: Left ventricular ejection fraction, by estimation, is 55 to 60%. The left ventricle has normal function. The left ventricle has no regional wall motion abnormalities. Definity contrast agent was given IV to delineate the left ventricular  endocardial borders. The left ventricular internal cavity size was normal in size. There is mild left ventricular hypertrophy. Left ventricular diastolic parameters are consistent with Grade II diastolic dysfunction (pseudonormalization). Elevated left atrial pressure. Aortic Valve: There is moderate calcification of the aortic valve. There is moderate thickening of the aortic valve. There is moderate aortic valve annular calcification. Aortic valve mean gradient measures 8.1 mmHg. Aortic valve peak gradient measures 13.0 mmHg. Aortic valve area, by VTI measures 1.67 cm. LEFT VENTRICLE PLAX 2D LVIDd:         5.20 cm  Diastology LVIDs:         3.71 cm  LV e' medial:    4.47 cm/s LV PW:         1.30 cm  LV E/e' medial:  23.9 LV IVS:        1.10 cm  LV e' lateral:   9.99 cm/s LVOT diam:     2.10 cm  LV E/e' lateral: 10.7 LV SV:         72 LV SV Index:   30 LVOT Area:     3.46 cm  LEFT  ATRIUM         Index LA diam:    5.40 cm 2.20 cm/m  AORTIC VALVE AV Area (Vmax):    1.59 cm AV Area (Vmean):   1.63 cm AV Area (VTI):     1.67 cm AV Vmax:           180.35 cm/s AV Vmean:           137.247 cm/s AV VTI:            0.435 m AV Peak Grad:      13.0 mmHg AV Mean Grad:      8.1 mmHg LVOT Vmax:         82.69 cm/s LVOT Vmean:        64.586 cm/s LVOT VTI:          0.209 m LVOT/AV VTI ratio: 0.48  AORTA Ao Root diam: 3.30 cm MITRAL VALVE MV Area (PHT): 4.52 cm     SHUNTS MV Decel Time: 168 msec     Systemic VTI:  0.21 m MV E velocity: 107.00 cm/s  Systemic Diam: 2.10 cm MV A velocity: 58.30 cm/s MV E/A ratio:  1.84 Carlyle Dolly MD Electronically signed by Carlyle Dolly MD Signature Date/Time: 08/03/2020/9:46:14 AM    Final         Scheduled Meds: . aspirin EC  81 mg Oral Daily  . Chlorhexidine Gluconate Cloth  6 each Topical Daily  . clopidogrel  75 mg Oral Daily  . docusate sodium  100 mg Oral BID  . ferrous sulfate  325 mg Oral QODAY  . furosemide  40 mg Intravenous BID  . gabapentin  900 mg Oral BID  . heparin  5,000 Units Subcutaneous Q8H  . insulin aspart  0-15 Units Subcutaneous TID WC  . insulin aspart  0-5 Units Subcutaneous QHS  . insulin aspart  3 Units Subcutaneous TID WC  . insulin aspart protamine- aspart  55 Units Subcutaneous BID WC  . losartan  12.5 mg Oral Daily  . metoprolol succinate  25 mg Oral BID  . mometasone-formoterol  2 puff Inhalation BID  . mupirocin ointment  1 application Nasal BID  . pantoprazole  40 mg Oral Daily  . potassium chloride SA  20 mEq Oral Daily  . rosuvastatin  40 mg Oral QHS  . sodium chloride flush  3 mL Intravenous Q12H  . venlafaxine XR  150 mg Oral Q breakfast   Continuous Infusions: . sodium chloride       LOS: 3 days    Time spent: 35 minutes    Edger Husain Darleen Crocker, DO [facility]  If 7PM-7AM, please contact night-coverage [email or web address] 08/04/2020, 8:58 AM

## 2020-08-04 NOTE — TOC Progression Note (Addendum)
Transition of Care [facility]) - Progression Note    Patient Details  Name: BASCOM BIEL MRN: [id number] Date of Birth: 1960-12-30  Transition of Care [facility]) CM/SW Contact  Natasha Bence, LCSW Phone Number: 08/04/2020, 6:15 PM  Clinical Narrative:    CSW reviewed options for SNF for patient. Patient refused SNF and state that he "wasn't going to a human factory." CSW discussed options for OPPT with patient. Patient reported that he would be agreeable but did not have transport to get there. CSW contacted the Transportation Department to assist with [place] transportation. Rep with the Transportation Department reported that reoccurring transportation would have to be set up through [facility] transportation when they are open on Monday. CSW confirmed with MD that expected d/c would not be until at least Monday. Patient reported that he did not feel safe returning home. CSW discussed notifying DSS/APS of his safety concerns. Patient reported that APS had already seen him, but he was not able to fully disclose what he was experiencing in the home because his family members who contributed to him feeling unsafe in the home were present during the interview. CSW discussed with patient that CSW could and would make an APS referral for patient upon discharge and notify them of his concerns with being interviewed while family members he felt unsafe with were present. CSW placed referral for OPPT. TOC to follow.    Expected Discharge Plan: [place] Barriers to Discharge: Continued Medical Work up  Expected Discharge Plan and Services Expected Discharge Plan: [place] In-house Referral: NA Discharge Planning Services: NA Post Acute Care Choice: NA Living arrangements for the past 2 months: Mobile Home                 DME Arranged: Bedside commode DME Agency: [employer] Date DME Agency Contacted: 08/02/20 Time DME Agency Contacted: [phone] Representative spoke with  at DME Agency: [place] ([place]) Interventions    Readmission Risk Interventions Readmission Risk Prevention Plan 08/02/2020 03/07/2020 12/06/2019  Transportation Screening Complete Complete Complete  PCP or Specialist Appt within 3-5 Days - - Not Complete  HRI or [place] - - Not Complete  Social Work Consult for [place] Planning/Counseling - - Not Complete  Palliative Care Screening - - Not Complete  Medication Review Press photographer) Complete Complete Complete  HRI or Home Care Consult Complete - -  SW Recovery Care/Counseling Consult Complete - -  Palliative Care Screening Not Applicable - -  [place] Not Applicable - -  Some recent data might be hidden

## 2020-08-05 LAB — BASIC METABOLIC PANEL
Anion gap: 10 (ref 5–15)
BUN: 31 mg/dL — ABNORMAL HIGH (ref 6–20)
CO2: 28 mmol/L (ref 22–32)
Calcium: 9.6 mg/dL (ref 8.9–10.3)
Chloride: 97 mmol/L — ABNORMAL LOW (ref 98–111)
Creatinine, Ser: 1 mg/dL (ref 0.61–1.24)
GFR calc Af Amer: >60 mL/min (ref >60)
GFR calc non Af Amer: >60 mL/min (ref >60)
Glucose, Bld: 104 mg/dL — ABNORMAL HIGH (ref 70–99)
Potassium: 3.8 mmol/L (ref 3.5–5.1)
Sodium: 135 mmol/L (ref 135–145)

## 2020-08-05 LAB — GLUCOSE, CAPILLARY
Glucose-Capillary: 117 mg/dL — ABNORMAL HIGH (ref 70–99)
Glucose-Capillary: 206 mg/dL — ABNORMAL HIGH (ref 70–99)
Glucose-Capillary: 103 mg/dL — ABNORMAL HIGH (ref 70–99)
Glucose-Capillary: 136 mg/dL — ABNORMAL HIGH (ref 70–99)

## 2020-08-05 LAB — MAGNESIUM: Magnesium: 2 mg/dL (ref 1.7–2.4)

## 2020-08-05 MED ORDER — FUROSEMIDE 10 MG/ML IJ SOLN
60.0000 mg | Freq: Two times a day (BID) | INTRAMUSCULAR | Status: AC
  Administered 2020-08-05 – 2020-08-07 (×4): 60 mg via INTRAVENOUS
  Filled 2020-08-05 (×4): qty 6

## 2020-08-05 NOTE — Progress Notes (Signed)
PROGRESS NOTE    Shane Chambers  EPP:[id number] DOB: Sep 21, 1961 DOA: 08/01/2020 PCP: [name], MD   Brief Narrative:  Per HPI: Shane Chambers a 59 y.o.malewith medical history significant forCAD with CABG on 04/629, grade 2 diastolic CHF with LVEF 16-01% on echo 04/2020, hypertension, dyslipidemia, type 2 diabetes, GERD, BPH, obesity, mood disorder, and iron deficiency anemia who presented to the ED with worsening shortness of breath. He states that his shortness of breath has worsened over the past week and has had swelling to his bilateral lower extremities. He denies any chest pain, fevers, or chills.He states that his baseline weight is typically 280 pounds and he has come up to approximately 298 pounds. He does have some orthopnea noted and also a dry cough, but no wheezing or chest tightness. He states that he has some baseline shortness of breath with exertion that is not unusual for him. He does not wear a CPAP mask at night or any other oxygen supplementation. He has recently completed both of his Covid vaccinations within the last 2 weeks. Mother on phonestates that patient has been drinking quite a bit of fluid. Patient also states that he tends to eat a significant amount of boxed/processedfoods. He is apparently very compliant with his Lasix use and states that he may have missed 1 day in the last 2 weeks.  9/23:Patient has been admitted with acute on chronic diastolic CHF and has been diuresing quite well. Plans to continue current diuresis as ordered per cardiology and check 2D echocardiogram today.  9/24:Plans to continue ongoing diuresis with improvement in shortness of breath noted. 2D echocardiogram similar to prior.  9/25: Patient continues to have ongoing significant diuresis with stable renal function.  -3.5 L output documented overnight.  Weight is currently 292 pounds.  9/26: Patient states he continues to have good urine output, but only 500 -  urine output noted overnight.  His edema is improving and he overall is starting to feel better.  Current weight is 289 pounds and he is nearing his baseline.  Creatinine remained stable.  Plan to increase Lasix to 60 mg IV twice daily today and monitor for at least 1 more day.  Assessment & Plan:   Active Problems:   CHF exacerbation (HCC)   Acute hypoxemic respiratory failure secondary to acute on chronic diastolic congestive heart failure with moderate left pleural effusion -Likely related to dietary/fluid restriction noncompliance -Recent 2D echocardiogram on 04/2020 with LVEF 60-65% and grade 2 diastolic CHF,repeat 2D echocardiogram similar on 9/24 -Status post thoracentesis 9/22 with 120 cc removed -ContinueLasix as ordered, now 60 mg IV twice daily with -560mL output over 24 hours and stable Cr. -Fluid restriction, strict input and output, daily weights -Wean oxygen as tolerated  QuestionableCOPD -Being evaluated by pulmonology, Dr. Elsworth Soho in outpatient setting -Prior history of tobacco abuse -Plan to order breathing treatments as needed -No significant bronchospasms currently noted, therefore we will avoid steroids especially in setting of diabetes  CAD with prior CABG 03/2020 -Continue dual antiplatelet therapy with aspirin and Plavix -Continue statinnow with Crestor 40 mg -Hold beta-blocker given soft blood pressure readings -No sign of ACS currently noted  Type 2 diabetes with polyneuropathy-stable -Hold home Metformin -Continue home 70/30 insulin -Diabetic diet -SSI -Hold Metformin  History of hypertension-stabilized -Resume home metoprolol and hold losartan while on Lasix for diuresis -Monitor carefullyon aggressive IV diuresis  Iron deficiency anemia -Currently appears stable -Continue monitoring -Continue home iron supplementation and stool softener -No overt bleeding  noted  Mood disorder -Continue venlafaxine  BPH -Continue  Flomax  GERD -Continue PPI  Obesity with possible OSA -Lifestyle changes -He still needs sleep study outpatient and does not wear CPAP at night   DVT prophylaxis:Heparin Code Status:Full Family Communication:None at bedside Disposition Plan: Status is: Inpatient  Remains inpatient appropriate because:IV treatments appropriate due to intensity of illness or inability to take PO and Inpatient level of care appropriate due to severity of illness   Dispo: The patient is from:Home Anticipated d/c is WC:[facility] Anticipated d/c date is:1-2 days Patient currently is not medically stable to d/c.Patient continues to require IV diuresis for ongoing treatment of his heart failure.   Consultants:  Cardiology  Procedures:  Left-sided thoracentesis on 9/22 with 120 cc removed  See below  Antimicrobials:   None  Subjective: Patient seen and evaluated today with no new acute complaints or concerns. No acute concerns or events noted overnight.  He is starting to have less shortness of breath and less edema noted and is approaching his baseline.  Objective: Vitals:   08/04/20 1930 08/04/20 2050 08/05/20 0500 08/05/20 0649  BP:  (!) 151/82  (!) 154/85  Pulse:  75  76  Resp:  20  18  Temp:  98.4 F (36.9 C)  98.3 F (36.8 C)  TempSrc:      SpO2: 100% 99%  98%  Weight:   131.4 kg   Height:        Intake/Output Summary (Last 24 hours) at 08/05/2020 0935 Last data filed at 08/05/2020 0700 Gross per 24 hour  Intake 700 ml  Output 1400 ml  Net -700 ml   Filed Weights   08/03/20 0500 08/04/20 0500 08/05/20 0500  Weight: 135.4 kg 132.9 kg 131.4 kg    Examination:  General exam: Appears calm and comfortable  Respiratory system: Clear to auscultation. Respiratory effort normal.  Currently on room air. Cardiovascular system: S1 & S2 heard, RRR.  Gastrointestinal system: Abdomen is nondistended, soft and nontender.   Central nervous system: Alert and oriented. No focal neurological deficits. Extremities: Scant bilateral edema. Skin: No rashes, lesions or ulcers Psychiatry: Judgement and insight appear normal. Mood & affect appropriate.     Data Reviewed: I have personally reviewed following labs and imaging studies  CBC: Recent Labs  Lab 08/01/20 0346 08/02/20 0428 08/03/20 0349 08/04/20 0459  WBC 16.7* 17.5* 15.2* 14.8*  NEUTROABS 11.2*  --   --   --   HGB 10.9* 9.8* 10.2* 10.5*  HCT 36.4* 32.1* 34.5* 34.7*  MCV 86.1 85.1 87.3 85.3  PLT 471* 446* 415* 778*   Basic Metabolic Panel: Recent Labs  Lab 08/01/20 0346 08/02/20 0428 08/03/20 0349 08/04/20 0459 08/05/20 0724  NA 137 135 137 137 135  K 3.9 3.9 3.8 3.8 3.8  CL 93* 96* 97* 95* 97*  CO2 28 28 29 29 28   GLUCOSE 74 166* 124* 109* 104*  BUN 23* 33* 42* 39* 31*  CREATININE 1.10 1.12 1.19 1.06 1.00  CALCIUM 9.6 9.2 9.1 9.4 9.6  MG  --  2.0 2.0 2.0 2.0   GFR: Estimated Creatinine Clearance: 109.5 mL/min (by C-G formula based on SCr of 1 mg/dL). Liver Function Tests: Recent Labs  Lab 08/01/20 0346  AST 27  ALT 20  ALKPHOS 108  BILITOT 0.7  PROT 7.9  ALBUMIN 3.8   No results for input(s): LIPASE, AMYLASE in the last 168 hours. No results for input(s): AMMONIA in the last 168 hours. Coagulation Profile: No results  for input(s): INR, PROTIME in the last 168 hours. Cardiac Enzymes: No results for input(s): CKTOTAL, CKMB, CKMBINDEX, TROPONINI in the last 168 hours. BNP (last 3 results) No results for input(s): PROBNP in the last 8760 hours. HbA1C: No results for input(s): HGBA1C in the last 72 hours. CBG: Recent Labs  Lab 08/04/20 0816 08/04/20 1118 08/04/20 1622 08/04/20 2150 08/05/20 0722  GLUCAP 112* 176* 155* 117* 117*   Lipid Profile: No results for input(s): CHOL, HDL, LDLCALC, TRIG, CHOLHDL, LDLDIRECT in the last 72 hours. Thyroid Function Tests: No results for input(s): TSH, T4TOTAL, FREET4, T3FREE,  THYROIDAB in the last 72 hours. Anemia Panel: No results for input(s): VITAMINB12, FOLATE, FERRITIN, TIBC, IRON, RETICCTPCT in the last 72 hours. Sepsis Labs: No results for input(s): PROCALCITON, LATICACIDVEN in the last 168 hours.  Recent Results (from the past 240 hour(s))  SARS Coronavirus 2 by RT PCR (hospital order, performed in [facility] hospital lab) Nasopharyngeal Nasopharyngeal Swab     Status: None   Collection Time: 08/01/20  3:42 AM   Specimen: Nasopharyngeal Swab  Result Value Ref Range Status   SARS Coronavirus 2 NEGATIVE NEGATIVE Final    Comment: (NOTE) SARS-CoV-2 target nucleic acids are NOT DETECTED.  The SARS-CoV-2 RNA is generally detectable in upper and lower respiratory specimens during the acute phase of infection. The lowest concentration of SARS-CoV-2 viral copies this assay can detect is 250 copies / mL. A negative result does not preclude SARS-CoV-2 infection and should not be used as the sole basis for treatment or other patient management decisions.  A negative result may occur with improper specimen collection / handling, submission of specimen other than nasopharyngeal swab, presence of viral mutation(s) within the areas targeted by this assay, and inadequate number of viral copies (<250 copies / mL). A negative result must be combined with clinical observations, patient history, and epidemiological information.  Fact Sheet for Patients:   StrictlyIdeas.no  Fact Sheet for Healthcare Providers: BankingDealers.co.za  This test is not yet approved or  cleared by the [place] FDA and has been authorized for detection and/or diagnosis of SARS-CoV-2 by FDA under an Emergency Use Authorization (EUA).  This EUA will remain in effect (meaning this test can be used) for the duration of the COVID-19 declaration under Section 564(b)(1) of the Act, 21 U.S.C. section 360bbb-3(b)(1), unless the authorization  is terminated or revoked sooner.  Performed at [facility], [address], [place], [place] [address]   Gram stain     Status: None   Collection Time: 08/01/20  9:07 AM   Specimen: Pleura; Body Fluid  Result Value Ref Range Status   Specimen Description PLEURAL  Final   Special Requests NONE  Final   Gram Stain   Final    CYTOSPIN SMEAR NO ORGANISMS SEEN WBC PRESENT,BOTH PMN AND MONONUCLEAR Performed at [facility], [address], [place], [place] [address]    Report Status 08/01/2020 FINAL  Final  Culture, body fluid-bottle     Status: None (Preliminary result)   Collection Time: 08/01/20  9:07 AM   Specimen: Pleura  Result Value Ref Range Status   Specimen Description PLEURAL  Final   Special Requests NONE  Final   Culture   Final    NO GROWTH 3 DAYS Performed at [facility], [address], [place], [place] [address]    Report Status PENDING  Incomplete  MRSA PCR Screening     Status: Abnormal   Collection Time: 08/02/20  5:00 AM  Specimen: Nasal Mucosa; Nasopharyngeal  Result Value Ref Range Status   MRSA by PCR POSITIVE (A) NEGATIVE Final    Comment:        The GeneXpert MRSA Assay (FDA approved for NASAL specimens only), is one component of a comprehensive MRSA colonization surveillance program. It is not intended to diagnose MRSA infection nor to guide or monitor treatment for MRSA infections. RESULT CALLED TO, READ BACK BY AND VERIFIED WITH: DAVIS,C@[email or web address]  BY MATTHEWS, B 9.23.2021 Performed at [facility], [address] [place], [place] [address]          Radiology Studies: No results found.      Scheduled Meds: . aspirin EC  81 mg Oral Daily  . Chlorhexidine Gluconate Cloth  6 each Topical Daily  . clopidogrel  75 mg Oral Daily  . docusate sodium  100 mg Oral BID  . ferrous sulfate  325 mg Oral QODAY  . furosemide  60 mg Intravenous BID  . gabapentin  900 mg Oral BID  . heparin  5,000 Units Subcutaneous Q8H  . insulin aspart  0-15  Units Subcutaneous TID WC  . insulin aspart  0-5 Units Subcutaneous QHS  . insulin aspart  3 Units Subcutaneous TID WC  . insulin aspart protamine- aspart  55 Units Subcutaneous BID WC  . losartan  12.5 mg Oral Daily  . metoprolol succinate  25 mg Oral BID  . mometasone-formoterol  2 puff Inhalation BID  . mupirocin ointment  1 application Nasal BID  . pantoprazole  40 mg Oral Daily  . potassium chloride SA  20 mEq Oral Daily  . rosuvastatin  40 mg Oral QHS  . sodium chloride flush  3 mL Intravenous Q12H  . venlafaxine XR  150 mg Oral Q breakfast   Continuous Infusions: . sodium chloride       LOS: 4 days    Time spent: 30 minutes    Kataleah Bejar Darleen Crocker, DO [facility]  If 7PM-7AM, please contact night-coverage [email or web address] 08/05/2020, 9:35 AM

## 2020-08-06 LAB — BASIC METABOLIC PANEL
Anion gap: 11 (ref 5–15)
BUN: 29 mg/dL — ABNORMAL HIGH (ref 6–20)
CO2: 28 mmol/L (ref 22–32)
Calcium: 9.2 mg/dL (ref 8.9–10.3)
Chloride: 99 mmol/L (ref 98–111)
Creatinine, Ser: 1.03 mg/dL (ref 0.61–1.24)
GFR calc Af Amer: >60 mL/min (ref >60)
GFR calc non Af Amer: >60 mL/min (ref >60)
Glucose, Bld: 157 mg/dL — ABNORMAL HIGH (ref 70–99)
Potassium: 3.7 mmol/L (ref 3.5–5.1)
Sodium: 138 mmol/L (ref 135–145)

## 2020-08-06 LAB — GLUCOSE, CAPILLARY
Glucose-Capillary: 157 mg/dL — ABNORMAL HIGH (ref 70–99)
Glucose-Capillary: 119 mg/dL — ABNORMAL HIGH (ref 70–99)
Glucose-Capillary: 100 mg/dL — ABNORMAL HIGH (ref 70–99)
Glucose-Capillary: 128 mg/dL — ABNORMAL HIGH (ref 70–99)

## 2020-08-06 LAB — CULTURE, BODY FLUID W GRAM STAIN -BOTTLE: Culture: NO GROWTH

## 2020-08-06 LAB — MAGNESIUM: Magnesium: 2.1 mg/dL (ref 1.7–2.4)

## 2020-08-06 MED ORDER — DIPHENHYDRAMINE HCL 25 MG PO CAPS
25.0000 mg | ORAL_CAPSULE | Freq: Every evening | ORAL | Status: DC | PRN
  Administered 2020-08-06: 25 mg via ORAL
  Filled 2020-08-06: qty 1

## 2020-08-06 MED ORDER — PHENOL 1.4 % MT LIQD: 1.0000 | OROMUCOSAL | Status: DC | PRN

## 2020-08-06 NOTE — Progress Notes (Signed)
PROGRESS NOTE    Shane Chambers  JAS:[id number] DOB: Feb 07, 1961 DOA: 08/01/2020 PCP: [name], MD   Brief Narrative:  Per HPI: Shane Chambers a 59 y.o.malewith medical history significant forCAD with CABG on 02/1936, grade 2 diastolic CHF with LVEF 90-24% on echo 04/2020, hypertension, dyslipidemia, type 2 diabetes, GERD, BPH, obesity, mood disorder, and iron deficiency anemia who presented to the ED with worsening shortness of breath. He states that his shortness of breath has worsened over the past week and has had swelling to his bilateral lower extremities. He denies any chest pain, fevers, or chills.He states that his baseline weight is typically 280 pounds and he has come up to approximately 298 pounds. He does have some orthopnea noted and also a dry cough, but no wheezing or chest tightness. He states that he has some baseline shortness of breath with exertion that is not unusual for him. He does not wear a CPAP mask at night or any other oxygen supplementation. He has recently completed both of his Covid vaccinations within the last 2 weeks. Mother on phonestates that patient has been drinking quite a bit of fluid. Patient also states that he tends to eat a significant amount of boxed/processedfoods. He is apparently very compliant with his Lasix use and states that he may have missed 1 day in the last 2 weeks.  9/23:Patient has been admitted with acute on chronic diastolic CHF and has been diuresing quite well. Plans to continue current diuresis as ordered per cardiology and check 2D echocardiogram today.  9/24:Plans to continue ongoing diuresis with improvement in shortness of breath noted. 2D echocardiogram similar to prior.  9/25:Patient continues to have ongoing significant diuresis with stable renal function. -3.5 L output documented overnight. Weight is currently 292 pounds.  9/26: Patient states he continues to have good urine output, but only 500  - urine output noted overnight.  His edema is improving and he overall is starting to feel better.  Current weight is 289 pounds and he is nearing his baseline.  Creatinine remained stable.  Plan to increase Lasix to 60 mg IV twice daily today and monitor for at least 1 more day.  9/27: Patient continues to have great urine output on Lasix 60 mg IV twice daily.  Plan to continue the same for now and switch to oral torsemide in the next 1-2 days.  Weight is currently 289 pounds.  Assessment & Plan:   Active Problems:   CHF exacerbation (HCC)   Acute hypoxemic respiratory failure secondary to acute on chronic diastolic congestive heart failure with moderate left pleural effusion -Likely related to dietary/fluid restriction noncompliance -Recent 2D echocardiogram on 04/2020 with LVEF 60-65% and grade 2 diastolic CHF,repeat 2D echocardiogram similar on 9/24 -Status post thoracentesis 9/22 with 120 cc removed -ContinueLasix as ordered, now 60 mg IV twice daily with-2.9 L output over 24 hours and stable Cr. -Fluid restriction, strict input and output, daily weights -Wean oxygen as tolerated  QuestionableCOPD -Being evaluated by pulmonology, Dr. Elsworth Soho in outpatient setting -Prior history of tobacco abuse -Plan to order breathing treatments as needed -No significant bronchospasms currently noted, therefore we will avoid steroids especially in setting of diabetes  CAD with prior CABG 03/2020 -Continue dual antiplatelet therapy with aspirin and Plavix -Continue statinnow with Crestor 40 mg -Hold beta-blocker given soft blood pressure readings -No sign of ACS currently noted  Type 2 diabetes with polyneuropathy-stable -Hold home Metformin -Continue home 70/30 insulin -Diabetic diet -SSI -Hold Metformin  History  of hypertension-stabilized -Resume home metoprolol and hold losartan while on Lasix for diuresis -Monitor carefullyon aggressive IV diuresis  Iron deficiency  anemia -Currently appears stable -Continue monitoring -Continue home iron supplementation and stool softener -No overt bleeding noted  Mood disorder -Continue venlafaxine  BPH -Continue Flomax  GERD -Continue PPI  Obesity with possible OSA -Lifestyle changes -He still needs sleep study outpatient and does not wear CPAP at night   DVT prophylaxis:Heparin Code Status:Full Family Communication:None at bedside Disposition Plan: Status is: Inpatient  Remains inpatient appropriate because:IV treatments appropriate due to intensity of illness or inability to take PO and Inpatient level of care appropriate due to severity of illness   Dispo: The patient is from:Home Anticipated d/c is EH:[facility] Anticipated d/c date is:1-2 days Patient currently is not medically stable to d/c.Patient continues to require IV diuresis for ongoing treatment of his heart failure.   Consultants:  Cardiology  Procedures:  Left-sided thoracentesis on 9/22 with 120 cc removed  See below  Antimicrobials:   None  Subjective: Patient seen and evaluated today with no new acute complaints or concerns. No acute concerns or events noted overnight.  His shortness of breath is slowly improving as well as his lower extremity edema.  He is complaining of some sore throat and difficulty sleeping last night.  Objective: Vitals:   08/05/20 1953 08/05/20 2010 08/06/20 0511 08/06/20 0825  BP:  (!) 120/55 (!) 146/70   Pulse: 79 81 79   Resp: 18 18 18    Temp:  97.8 F (36.6 C) (!) 97.5 F (36.4 C)   TempSrc:   Oral   SpO2: 97% 100% 100% 98%  Weight:      Height:        Intake/Output Summary (Last 24 hours) at 08/06/2020 1102 Last data filed at 08/06/2020 0932 Gross per 24 hour  Intake --  Output 2900 ml  Net -2900 ml   Filed Weights   08/03/20 0500 08/04/20 0500 08/05/20 0500  Weight: 135.4 kg 132.9 kg 131.4 kg     Examination:  General exam: Appears calm and comfortable  Respiratory system: Clear to auscultation. Respiratory effort normal. Cardiovascular system: S1 & S2 heard, RRR Gastrointestinal system: Abdomen is nondistended, soft and nontender. Central nervous system: Alert and oriented. No focal neurological deficits. Extremities: Scant bilateral edema. Skin: No rashes, lesions or ulcers Psychiatry: Judgement and insight appear normal. Mood & affect appropriate.     Data Reviewed: I have personally reviewed following labs and imaging studies  CBC: Recent Labs  Lab 08/01/20 0346 08/02/20 0428 08/03/20 0349 08/04/20 0459  WBC 16.7* 17.5* 15.2* 14.8*  NEUTROABS 11.2*  --   --   --   HGB 10.9* 9.8* 10.2* 10.5*  HCT 36.4* 32.1* 34.5* 34.7*  MCV 86.1 85.1 87.3 85.3  PLT 471* 446* 415* 709*   Basic Metabolic Panel: Recent Labs  Lab 08/02/20 0428 08/03/20 0349 08/04/20 0459 08/05/20 0724 08/06/20 0610  NA 135 137 137 135 138  K 3.9 3.8 3.8 3.8 3.7  CL 96* 97* 95* 97* 99  CO2 28 29 29 28 28   GLUCOSE 166* 124* 109* 104* 157*  BUN 33* 42* 39* 31* 29*  CREATININE 1.12 1.19 1.06 1.00 1.03  CALCIUM 9.2 9.1 9.4 9.6 9.2  MG 2.0 2.0 2.0 2.0 2.1   GFR: Estimated Creatinine Clearance: 106.3 mL/min (by C-G formula based on SCr of 1.03 mg/dL). Liver Function Tests: Recent Labs  Lab 08/01/20 0346  AST 27  ALT 20  ALKPHOS 108  BILITOT 0.7  PROT 7.9  ALBUMIN 3.8   No results for input(s): LIPASE, AMYLASE in the last 168 hours. No results for input(s): AMMONIA in the last 168 hours. Coagulation Profile: No results for input(s): INR, PROTIME in the last 168 hours. Cardiac Enzymes: No results for input(s): CKTOTAL, CKMB, CKMBINDEX, TROPONINI in the last 168 hours. BNP (last 3 results) No results for input(s): PROBNP in the last 8760 hours. HbA1C: No results for input(s): HGBA1C in the last 72 hours. CBG: Recent Labs  Lab 08/05/20 0722 08/05/20 1128 08/05/20 1622  08/05/20 2109 08/06/20 0749  GLUCAP 117* 206* 103* 136* 157*   Lipid Profile: No results for input(s): CHOL, HDL, LDLCALC, TRIG, CHOLHDL, LDLDIRECT in the last 72 hours. Thyroid Function Tests: No results for input(s): TSH, T4TOTAL, FREET4, T3FREE, THYROIDAB in the last 72 hours. Anemia Panel: No results for input(s): VITAMINB12, FOLATE, FERRITIN, TIBC, IRON, RETICCTPCT in the last 72 hours. Sepsis Labs: No results for input(s): PROCALCITON, LATICACIDVEN in the last 168 hours.  Recent Results (from the past 240 hour(s))  SARS Coronavirus 2 by RT PCR (hospital order, performed in [facility] hospital lab) Nasopharyngeal Nasopharyngeal Swab     Status: None   Collection Time: 08/01/20  3:42 AM   Specimen: Nasopharyngeal Swab  Result Value Ref Range Status   SARS Coronavirus 2 NEGATIVE NEGATIVE Final    Comment: (NOTE) SARS-CoV-2 target nucleic acids are NOT DETECTED.  The SARS-CoV-2 RNA is generally detectable in upper and lower respiratory specimens during the acute phase of infection. The lowest concentration of SARS-CoV-2 viral copies this assay can detect is 250 copies / mL. A negative result does not preclude SARS-CoV-2 infection and should not be used as the sole basis for treatment or other patient management decisions.  A negative result may occur with improper specimen collection / handling, submission of specimen other than nasopharyngeal swab, presence of viral mutation(s) within the areas targeted by this assay, and inadequate number of viral copies (<250 copies / mL). A negative result must be combined with clinical observations, patient history, and epidemiological information.  Fact Sheet for Patients:   StrictlyIdeas.no  Fact Sheet for Healthcare Providers: BankingDealers.co.za  This test is not yet approved or  cleared by the [place] FDA and has been authorized for detection and/or diagnosis of SARS-CoV-2  by FDA under an Emergency Use Authorization (EUA).  This EUA will remain in effect (meaning this test can be used) for the duration of the COVID-19 declaration under Section 564(b)(1) of the Act, 21 U.S.C. section 360bbb-3(b)(1), unless the authorization is terminated or revoked sooner.  Performed at [facility], [address], [place], [place] [address]   Gram stain     Status: None   Collection Time: 08/01/20  9:07 AM   Specimen: Pleura; Body Fluid  Result Value Ref Range Status   Specimen Description PLEURAL  Final   Special Requests NONE  Final   Gram Stain   Final    CYTOSPIN SMEAR NO ORGANISMS SEEN WBC PRESENT,BOTH PMN AND MONONUCLEAR Performed at [facility], [address] [place], [place] [address]    Report Status 08/01/2020 FINAL  Final  Culture, body fluid-bottle     Status: None   Collection Time: 08/01/20  9:07 AM   Specimen: Pleura  Result Value Ref Range Status   Specimen Description PLEURAL  Final   Special Requests NONE  Final   Culture   Final    NO GROWTH 5 DAYS Performed at [facility],  [address], [place], [place] [address]    Report Status 08/06/2020 FINAL  Final  MRSA PCR Screening     Status: Abnormal   Collection Time: 08/02/20  5:00 AM   Specimen: Nasal Mucosa; Nasopharyngeal  Result Value Ref Range Status   MRSA by PCR POSITIVE (A) NEGATIVE Final    Comment:        The GeneXpert MRSA Assay (FDA approved for NASAL specimens only), is one component of a comprehensive MRSA colonization surveillance program. It is not intended to diagnose MRSA infection nor to guide or monitor treatment for MRSA infections. RESULT CALLED TO, READ BACK BY AND VERIFIED WITH: DAVIS,[email or web address]@[id number]  BY MATTHEWS, B 9.23.2021 Performed at [facility], [address] [place], [place] [address]          Radiology Studies: No results found.      Scheduled Meds: . aspirin EC  81 mg Oral Daily  . clopidogrel  75 mg Oral Daily  . docusate sodium  100 mg  Oral BID  . ferrous sulfate  325 mg Oral QODAY  . furosemide  60 mg Intravenous BID  . gabapentin  900 mg Oral BID  . heparin  5,000 Units Subcutaneous Q8H  . insulin aspart  0-15 Units Subcutaneous TID WC  . insulin aspart  0-5 Units Subcutaneous QHS  . insulin aspart  3 Units Subcutaneous TID WC  . insulin aspart protamine- aspart  55 Units Subcutaneous BID WC  . losartan  12.5 mg Oral Daily  . metoprolol succinate  25 mg Oral BID  . mometasone-formoterol  2 puff Inhalation BID  . mupirocin ointment  1 application Nasal BID  . pantoprazole  40 mg Oral Daily  . potassium chloride SA  20 mEq Oral Daily  . rosuvastatin  40 mg Oral QHS  . sodium chloride flush  3 mL Intravenous Q12H  . venlafaxine XR  150 mg Oral Q breakfast   Continuous Infusions: . sodium chloride       LOS: 5 days    Time spent: 30 minutes    Royale Lennartz Darleen Crocker, DO [facility]  If 7PM-7AM, please contact night-coverage [email or web address] 08/06/2020, 11:02 AM

## 2020-08-06 NOTE — Plan of Care (Signed)

## 2020-08-06 NOTE — Progress Notes (Signed)
Progress Note  Patient Name: Shane Chambers Date of Encounter: 08/06/2020  [facility] Cardiologist: Carlyle Dolly, MD   Subjective   SOB improving.   Inpatient Medications    Scheduled Meds: . aspirin EC  81 mg Oral Daily  . clopidogrel  75 mg Oral Daily  . docusate sodium  100 mg Oral BID  . ferrous sulfate  325 mg Oral QODAY  . furosemide  60 mg Intravenous BID  . gabapentin  900 mg Oral BID  . heparin  5,000 Units Subcutaneous Q8H  . insulin aspart  0-15 Units Subcutaneous TID WC  . insulin aspart  0-5 Units Subcutaneous QHS  . insulin aspart  3 Units Subcutaneous TID WC  . insulin aspart protamine- aspart  55 Units Subcutaneous BID WC  . losartan  12.5 mg Oral Daily  . metoprolol succinate  25 mg Oral BID  . mometasone-formoterol  2 puff Inhalation BID  . mupirocin ointment  1 application Nasal BID  . pantoprazole  40 mg Oral Daily  . potassium chloride SA  20 mEq Oral Daily  . rosuvastatin  40 mg Oral QHS  . sodium chloride flush  3 mL Intravenous Q12H  . venlafaxine XR  150 mg Oral Q breakfast   Continuous Infusions: . sodium chloride     PRN Meds: sodium chloride, acetaminophen **OR** acetaminophen, alum & mag hydroxide-simeth, guaiFENesin-dextromethorphan, ipratropium-albuterol, ondansetron **OR** ondansetron (ZOFRAN) IV, polyethylene glycol, sodium chloride flush, traMADol   Vital Signs    Vitals:   08/05/20 0955 08/05/20 1953 08/05/20 2010 08/06/20 0511  BP:   (!) 120/55 (!) 146/70  Pulse:  79 81 79  Resp:  18 18 18   Temp:   97.8 F (36.6 C) (!) 97.5 F (36.4 C)  TempSrc:    Oral  SpO2: 97% 97% 100% 100%  Weight:      Height:        Intake/Output Summary (Last 24 hours) at 08/06/2020 0857 Last data filed at 08/06/2020 2130 Gross per 24 hour  Intake --  Output 2400 ml  Net -2400 ml   Last 3 Weights 08/05/2020 08/04/2020 08/03/2020  Weight (lbs) 289 lb 9.6 oz 292 lb 15.9 oz 298 lb 8.1 oz  Weight (kg) 131.362 kg 132.9 kg 135.4 kg       Telemetry    NSR - Personally Reviewed  ECG    n/a- Personally Reviewed  Physical Exam   GEN: No acute distress.   Neck: No JVD Cardiac: RRR, no murmurs, rubs, or gallops.  Respiratory: Clear to auscultation bilaterally. GI: Soft, nontender, non-distended  MS: Trace bialteral edema; No deformity. Neuro:  Nonfocal  Psych: Normal affect   Labs    High Sensitivity Troponin:   Recent Labs  Lab 08/01/20 0346 08/01/20 0520  TROPONINIHS 11 9      Chemistry Recent Labs  Lab 08/01/20 0346 08/02/20 0428 08/04/20 0459 08/05/20 0724 08/06/20 0610  NA 137   < > 137 135 138  K 3.9   < > 3.8 3.8 3.7  CL 93*   < > 95* 97* 99  CO2 28   < > 29 28 28   GLUCOSE 74   < > 109* 104* 157*  BUN 23*   < > 39* 31* 29*  CREATININE 1.10   < > 1.06 1.00 1.03  CALCIUM 9.6   < > 9.4 9.6 9.2  PROT 7.9  --   --   --   --   ALBUMIN 3.8  --   --   --   --  AST 27  --   --   --   --   ALT 20  --   --   --   --   ALKPHOS 108  --   --   --   --   BILITOT 0.7  --   --   --   --   GFRNONAA >60   < > >60 >60 >60  GFRAA >60   < > >60 >60 >60  ANIONGAP 16*   < > 13 10 11    < > = values in this interval not displayed.     Hematology Recent Labs  Lab 08/02/20 0428 08/03/20 0349 08/04/20 0459  WBC 17.5* 15.2* 14.8*  RBC 3.77* 3.95* 4.07*  HGB 9.8* 10.2* 10.5*  HCT 32.1* 34.5* 34.7*  MCV 85.1 87.3 85.3  MCH 26.0 25.8* 25.8*  MCHC 30.5 29.6* 30.3  RDW 16.2* 16.3* 16.1*  PLT 446* 415* 487*    BNP Recent Labs  Lab 08/01/20 0346  BNP 141.0*     DDimer No results for input(s): DDIMER in the last 168 hours.   Radiology    No results found.  Cardiac Studies     Patient Profile     Shane Chambers a 59 y.o.malewith past medical history of CAD (s/p CABG in 03/2020 with LIMA-LAD, RIMA-PL, [id number]), chronic systolic CHF (EF 62-95% by echo in 02/2020, at 40% by echo in 03/2020, normalized to 60-65% by echo in 04/2020), HTN, HLD,Type 2 DMand OSAwho is being seen today  for the evaluation of CHFat the request ofDr. Manuella Ghazi.  Assessment & Plan    1. Acute on chronic diastolic HF -12/8411 Echo: LVEF 60-65%, mod LVH, grade II DDx, normal RV function - 07/2020 limited echo LVEF 655-60%, grade II DDx (unchanged) - exacerbation secondary to dietary noncompliance  - incomplete data from nursing regarding I/Os. If weights accurate admit 299-->today 289 lbs.  -  He is on IV lasix 60mg  bid, renal function overall stable.   - breathing improving, not quite back to baseline. Continue IV lasix, anticipate likely changing to oral torsemide in day day or 2.    2.CAD - He is s/p CABG in 03/2020 with LIMA-LAD, RIMA-PL, RA-D1-RI-OM1. - HS Troponin values have been negative this admission and EKG is without acute ischemic changes.  -CABG in setting of NSTEMI, committed to 1 year of DAPT.  3. Pleural effusion - s/p thoracentesis this admission, removed 148mL of bloody fluid   4. Hyperlpidemia LDL not at goal of <70, changed atorva 80 to crestor 40 this admit    For questions or updates, please contact [place] Please consult [email or web address] for contact info under        Signed, Carlyle Dolly, MD  08/06/2020, 8:57 AM

## 2020-08-07 LAB — BASIC METABOLIC PANEL
Anion gap: 11 (ref 5–15)
BUN: 36 mg/dL — ABNORMAL HIGH (ref 6–20)
CO2: 28 mmol/L (ref 22–32)
Calcium: 9.4 mg/dL (ref 8.9–10.3)
Chloride: 97 mmol/L — ABNORMAL LOW (ref 98–111)
Creatinine, Ser: 1.17 mg/dL (ref 0.61–1.24)
GFR calc Af Amer: >60 mL/min (ref >60)
GFR calc non Af Amer: >60 mL/min (ref >60)
Glucose, Bld: 125 mg/dL — ABNORMAL HIGH (ref 70–99)
Potassium: 3.9 mmol/L (ref 3.5–5.1)
Sodium: 136 mmol/L (ref 135–145)

## 2020-08-07 LAB — GLUCOSE, CAPILLARY
Glucose-Capillary: 121 mg/dL — ABNORMAL HIGH (ref 70–99)
Glucose-Capillary: 178 mg/dL — ABNORMAL HIGH (ref 70–99)

## 2020-08-07 LAB — MAGNESIUM: Magnesium: 2 mg/dL (ref 1.7–2.4)

## 2020-08-07 MED ORDER — TORSEMIDE 20 MG PO TABS: 40.0000 mg | ORAL_TABLET | Freq: Two times a day (BID) | ORAL | 1 refills | Status: DC

## 2020-08-07 MED ORDER — ROSUVASTATIN CALCIUM 40 MG PO TABS: 40.0000 mg | ORAL_TABLET | Freq: Every day | ORAL | 2 refills | Status: DC

## 2020-08-07 MED ORDER — TORSEMIDE 20 MG PO TABS: 40.0000 mg | ORAL_TABLET | Freq: Two times a day (BID) | ORAL | Status: DC

## 2020-08-07 NOTE — Progress Notes (Signed)
Progress Note  Patient Name: Shane Chambers Date of Encounter: 08/07/2020  [facility] Cardiologist: Carlyle Dolly, MD   Subjective   Improved SOB  Inpatient Medications    Scheduled Meds: . aspirin EC  81 mg Oral Daily  . clopidogrel  75 mg Oral Daily  . docusate sodium  100 mg Oral BID  . ferrous sulfate  325 mg Oral QODAY  . furosemide  60 mg Intravenous BID  . gabapentin  900 mg Oral BID  . heparin  5,000 Units Subcutaneous Q8H  . insulin aspart  0-15 Units Subcutaneous TID WC  . insulin aspart  0-5 Units Subcutaneous QHS  . insulin aspart  3 Units Subcutaneous TID WC  . insulin aspart protamine- aspart  55 Units Subcutaneous BID WC  . losartan  12.5 mg Oral Daily  . metoprolol succinate  25 mg Oral BID  . mometasone-formoterol  2 puff Inhalation BID  . mupirocin ointment  1 application Nasal BID  . pantoprazole  40 mg Oral Daily  . potassium chloride SA  20 mEq Oral Daily  . rosuvastatin  40 mg Oral QHS  . sodium chloride flush  3 mL Intravenous Q12H  . venlafaxine XR  150 mg Oral Q breakfast   Continuous Infusions: . sodium chloride     PRN Meds: sodium chloride, acetaminophen **OR** acetaminophen, alum & mag hydroxide-simeth, diphenhydrAMINE, guaiFENesin-dextromethorphan, ipratropium-albuterol, ondansetron **OR** ondansetron (ZOFRAN) IV, phenol, polyethylene glycol, sodium chloride flush, traMADol   Vital Signs    Vitals:   08/06/20 1700 08/06/20 1949 08/06/20 2048 08/07/20 0534  BP: (!) 149/79  122/67 (!) 146/85  Pulse: 76  79 78  Resp: 18  20 20   Temp: 98.5 F (36.9 C)  98 F (36.7 C) 98.3 F (36.8 C)  TempSrc: Oral  Oral   SpO2: 100% 98% 100% 97%  Weight:    131.3 kg  Height:        Intake/Output Summary (Last 24 hours) at 08/07/2020 0805 Last data filed at 08/07/2020 0700 Gross per 24 hour  Intake 1200 ml  Output 3750 ml  Net -2550 ml   Last 3 Weights 08/07/2020 08/05/2020 08/04/2020  Weight (lbs) 289 lb 7.4 oz 289 lb 9.6 oz 292 lb  15.9 oz  Weight (kg) 131.3 kg 131.362 kg 132.9 kg      Telemetry    SR- Personally Reviewed  ECG    n/a- Personally Reviewed  Physical Exam   GEN: No acute distress.   Neck: No JVD Cardiac: RRR, no murmurs, rubs, or gallops.  Respiratory: Clear to auscultation bilaterally. GI: Soft, nontender, non-distended  MS: trace bilateral edema; No deformity. Neuro:  Nonfocal  Psych: Normal affect   Labs    High Sensitivity Troponin:   Recent Labs  Lab 08/01/20 0346 08/01/20 0520  TROPONINIHS 11 9      Chemistry Recent Labs  Lab 08/01/20 0346 08/02/20 0428 08/05/20 0724 08/06/20 0610 08/07/20 0255  NA 137   < > 135 138 136  K 3.9   < > 3.8 3.7 3.9  CL 93*   < > 97* 99 97*  CO2 28   < > 28 28 28   GLUCOSE 74   < > 104* 157* 125*  BUN 23*   < > 31* 29* 36*  CREATININE 1.10   < > 1.00 1.03 1.17  CALCIUM 9.6   < > 9.6 9.2 9.4  PROT 7.9  --   --   --   --   ALBUMIN  3.8  --   --   --   --   AST 27  --   --   --   --   ALT 20  --   --   --   --   ALKPHOS 108  --   --   --   --   BILITOT 0.7  --   --   --   --   GFRNONAA >60   < > >60 >60 >60  GFRAA >60   < > >60 >60 >60  ANIONGAP 16*   < > 10 11 11    < > = values in this interval not displayed.     Hematology Recent Labs  Lab 08/02/20 0428 08/03/20 0349 08/04/20 0459  WBC 17.5* 15.2* 14.8*  RBC 3.77* 3.95* 4.07*  HGB 9.8* 10.2* 10.5*  HCT 32.1* 34.5* 34.7*  MCV 85.1 87.3 85.3  MCH 26.0 25.8* 25.8*  MCHC 30.5 29.6* 30.3  RDW 16.2* 16.3* 16.1*  PLT 446* 415* 487*    BNP Recent Labs  Lab 08/01/20 0346  BNP 141.0*     DDimer No results for input(s): DDIMER in the last 168 hours.   Radiology    No results found.  Cardiac Studies     Patient Profile     Shane Chambers a 59 y.o.malewith past medical history of CAD (s/p CABG in 03/2020 with LIMA-LAD, RIMA-PL, [id number]), chronic systolic CHF (EF 61-44% by echo in 02/2020, at 40% by echo in 03/2020, normalized to 60-65% by echo in  04/2020), HTN, HLD,Type 2 DMand OSAwho is being seen today for the evaluation of CHFat the request ofDr. Manuella Ghazi.  Assessment & Plan    1. Acute on chronic diastolic HF -01/1539 Echo: LVEF 60-65%, mod LVH, grade II DDx, normal RV function - 07/2020 limited echo LVEF 655-60%, grade II DDx (unchanged) - exacerbation secondary to dietary noncompliance  - incomplete data from nursing regarding I/Os. If weights accurate admit 299-->today 289 lbs.  -He is on IV lasix 60mg  bid. Mild uptrend in Cr  -change to oral torsemide 40mg  bid today.    2.CAD - He is s/p CABG in 03/2020 with LIMA-LAD, RIMA-PL, RA-D1-RI-OM1. - HS Troponin values have been negative this admission and EKG is without acute ischemic changes.  -CABG in setting of NSTEMI, committed to 1 year of DAPT.  3. Pleural effusion - s/p thoracentesis this admission, removed 173mL of bloody fluid   4. Hyperlpidemia LDL not at goal of <70, changedatorva 80 to crestor 40 this admit  We will signoff inpatient care, we will arrange follow up. Will need a BMET/Mg arranged at f/u appt      For questions or updates, please contact [place] Please consult [email or web address] for contact info under        Signed, Carlyle Dolly, MD  08/07/2020, 8:05 AM

## 2020-08-07 NOTE — Progress Notes (Signed)
Physical Therapy Treatment Patient Details Name: Shane Chambers MRN: [id number] DOB: 1960/11/28 Today's Date: 08/07/2020    History of Present Illness Shane Chambers is a 59 y.o. male with medical history significant for CAD with CABG on 02/2352, grade 2 diastolic CHF with LVEF 61-44% on echo 04/2020, hypertension, dyslipidemia, type 2 diabetes, GERD, BPH, obesity, mood disorder, and iron deficiency anemia who presented to the ED with worsening shortness of breath.  He states that his shortness of breath has worsened over the past week and has had swelling to his bilateral lower extremities.  He denies any chest pain, fevers, or chills.  He states that his baseline weight is typically 280 pounds and he has come up to approximately 298 pounds.  He does have some orthopnea noted and also a dry cough, but no wheezing or chest tightness.  He states that he has some baseline shortness of breath with exertion that is not unusual for him.  He does not wear a CPAP mask at night or any other oxygen supplementation.  He has recently completed both of his Covid vaccinations within the last 2 weeks. Mother on phone states that patient has been drinking quite a bit of fluid.  Patient also states that he tends to eat a significant amount of boxed/processed foods.  He is apparently very compliant with his Lasix use and states that he may have missed 1 day in the last 2 weeks.    PT Comments    Pt able to come to sitting EOB with increased time, momentarily rests on elbow due to fatigue while coming to sitting EOB. Pt able to stand from EOB without physical assist, but requires mod A to pivot over to bedside chair due to unsteadiness and weakness. Pt attempts to take steps at bedside with hands on bed/chair but bil knee buckling noted requiring max A from therapist to block knees and prevent fall. Educated pt on benefit of RW usage to improve safety and reduce risk for falls. Again, educated pt on benefit of SNF for  strengthening but pt continues to politely decline. Pt tolerates remaining up in chair, call bell in lap and chair alarm on; RN notified of session. Patient will benefit from continued physical therapy in hospital and recommendations below to increase strength, balance, endurance for safe ADLs and gait.   Follow Up Recommendations  SNF;Supervision/Assistance - 24 hour;Supervision for mobility/OOB     Equipment Recommendations  None recommended by PT    Recommendations for Other Services       Precautions / Restrictions Precautions Precautions: Fall Restrictions Weight Bearing Restrictions: No    Mobility  Bed Mobility Overal bed mobility: Needs Assistance Bed Mobility: Supine to Sit  Sit to supine: Supervision   General bed mobility comments: slow labored movement with of use of bedrail to upright trunk, steadys self on elbow before fully uprighting trunk  Transfers Overall transfer level: Needs assistance  Transfers: Sit to/from Stand;Stand Pivot Transfers Sit to Stand: Min guard;From elevated surface Stand pivot transfers: Min assist    General transfer comment: min G to power up from EOB with BUE assisting, mod A to pivot over to chair for steadying  Ambulation/Gait Ambulation/Gait assistance: Mod assist  General Gait Details: pt declines RW and instead uses hands on chair/bed for steadying, attempts to take a few sidesteps over to chair with BLE knee buckling noted requiring max A from therapist to block knees and prevent fall   Stairs  Wheelchair Mobility    Modified Rankin (Stroke Patients Only)       Balance Overall balance assessment: Needs assistance Sitting-balance support: Feet supported Sitting balance-Leahy Scale: Good Sitting balance - Comments: seated EOB   Standing balance support: During functional activity;Bilateral upper extremity supported Standing balance-Leahy Scale: Poor Standing balance comment: reliant on UE support          Cognition Arousal/Alertness: Awake/alert Behavior During Therapy: WFL for tasks assessed/performed Overall Cognitive Status: Within Functional Limits for tasks assessed         Exercises General Exercises - Lower Extremity Long Arc Quad: AROM;Strengthening;Both;15 reps;Seated Hip ABduction/ADduction: AROM;Strengthening;Both;15 reps;Seated Hip Flexion/Marching: AROM;Strengthening;Both;15 reps;Seated Toe Raises: AROM;Strengthening;Both;15 reps;Seated Heel Raises: AROM;Strengthening;Both;15 reps;Seated Other Exercises Other Exercises: STS, 3 reps, BUE assisting, rocking momenutm    General Comments General comments (skin integrity, edema, etc.): on RA with SpO2 98% and moderate SOB with exercise      Pertinent Vitals/Pain Pain Assessment: Faces Faces Pain Scale: Hurts little more Pain Location: thighs with movement Pain Descriptors / Indicators: Aching;Sore Pain Intervention(s): Limited activity within patient's tolerance;Monitored during session;Repositioned    Home Living                      Prior Function            PT Goals (current goals can now be found in the care plan section) Acute Rehab PT Goals Patient Stated Goal: return home PT Goal Formulation: With patient Time For Goal Achievement: 08/17/20 Potential to Achieve Goals: Fair Progress towards PT goals: Progressing toward goals    Frequency    Min 3X/week      PT Plan Current plan remains appropriate    Co-evaluation              AM-PAC PT "6 Clicks" Mobility   Outcome Measure  Help needed turning from your back to your side while in a flat bed without using bedrails?: None Help needed moving from lying on your back to sitting on the side of a flat bed without using bedrails?: None Help needed moving to and from a bed to a chair (including a wheelchair)?: A Little Help needed standing up from a chair using your arms (e.g., wheelchair or bedside chair)?: A Little Help  needed to walk in hospital room?: A Lot Help needed climbing 3-5 steps with a railing? : Total 6 Click Score: 17    End of Session   Activity Tolerance: Patient limited by fatigue;Patient tolerated treatment well Patient left: in chair;with call bell/phone within reach Nurse Communication: Mobility status PT Visit Diagnosis: Unsteadiness on feet (R26.81);Other abnormalities of gait and mobility (R26.89);Muscle weakness (generalized) (M62.81)     Time: 0762-2633 PT Time Calculation (min) (ACUTE ONLY): 32 min  Charges:  $Therapeutic Exercise: 8-22 mins $Therapeutic Activity: 8-22 mins                      Tori Raniya Golembeski PT, DPT 08/07/20, 12:13 PM [phone]

## 2020-08-07 NOTE — Discharge Summary (Signed)
Physician Discharge Summary  Shane Chambers ERX:[id number] DOB: 12-22-1960 DOA: 08/01/2020  PCP: [name], MD  Admit date: 08/01/2020  Discharge date: 08/07/2020  Admitted From:Home  Disposition:  Home  Recommendations for Outpatient Follow-up:  1. Follow up with PCP in 1-2 weeks 2. Please obtain BMP/CBC in one week 3. Follow-up with cardiology as scheduled 4. Continue on torsemide 40 mg twice daily as prescribed 5. Continue other home medications as listed below 6. Monitor sodium intake and fluid intake and monitor daily weights.  Call physician office if 3 pound weight gain over 24 hours or 5 pound weight gain over 7 days noted. 7. See 2D echocardiogram results below.  Home Health: Outpatient PT  Equipment/Devices: None  Discharge Condition: Stable  CODE STATUS: Full  Diet recommendation: Heart Healthy/carb modified, 2 L fluid restriction  Brief/Interim Summary: Per HPI: Shane Chambers a 59 y.o.malewith medical history significant forCAD with CABG on 07/5092, grade 2 diastolic CHF with LVEF 26-71% on echo 04/2020, hypertension, dyslipidemia, type 2 diabetes, GERD, BPH, obesity, mood disorder, and iron deficiency anemia who presented to the ED with worsening shortness of breath. He states that his shortness of breath has worsened over the past week and has had swelling to his bilateral lower extremities. He denies any chest pain, fevers, or chills.He states that his baseline weight is typically 280 pounds and he has come up to approximately 298 pounds. He does have some orthopnea noted and also a dry cough, but no wheezing or chest tightness. He states that he has some baseline shortness of breath with exertion that is not unusual for him. He does not wear a CPAP mask at night or any other oxygen supplementation. He has recently completed both of his Covid vaccinations within the last 2 weeks. Mother on phonestates that patient has been drinking quite a bit of  fluid. Patient also states that he tends to eat a significant amount of boxed/processedfoods. He is apparently very compliant with his Lasix use and states that he may have missed 1 day in the last 2 weeks.  9/23:Patient has been admitted with acute on chronic diastolic CHF and has been diuresing quite well. Plans to continue current diuresis as ordered per cardiology and check 2D echocardiogram today.  9/24:Plans to continue ongoing diuresis with improvement in shortness of breath noted. 2D echocardiogram similar to prior.  9/25:Patient continues to have ongoing significant diuresis with stable renal function. -3.5 L output documented overnight. Weight is currently 292 pounds.  9/26:Patient states he continues to have good urine output, but only 500 - urine output noted overnight. His edema is improving and he overall is starting to feel better. Current weight is 289 pounds and he is nearing his baseline. Creatinine remained stable. Plan to increase Lasix to 60 mg IV twice daily today and monitor for at least 1 more day.  9/27: Patient continues to have great urine output on Lasix 60 mg IV twice daily.  Plan to continue the same for now and switch to oral torsemide in the next 1-2 days.  Weight is currently 289 pounds.  9/28: Patient has been transitioned to oral torsemide from IV Lasix per cardiology.  His creatinine has increased slightly this a.m.  He will require follow-up labs in the outpatient setting.  His discharge weight is still measured at 289 pounds and he has diuresed a total of approximately 13 L of fluid per documentation.  He has been room air and is otherwise doing much better today.  He is stable for discharge and will follow up with cardiology outpatient for management of his volume status as well.  No other acute events noted throughout the course of this admission.  He is overall stable for discharge.  Discharge Diagnoses:  Active Problems:   CHF exacerbation  ([place])  Principal discharge diagnosis: Acute hypoxemic respiratory failure secondary to acute on chronic diastolic congestive heart failure exacerbation with moderate left pleural effusion status post thoracentesis-secondary to dietary noncompliance.  Discharge Instructions  Discharge Instructions    Ambulatory referral to Physical Therapy   Complete by: As directed    Diet - low sodium heart healthy   Complete by: As directed    Increase activity slowly   Complete by: As directed      Allergies as of 08/07/2020      Reactions   Ace Inhibitors Swelling, Cough   Other Itching   [name]   Penicillins    Has patient had a PCN reaction causing immediate rash, facial/tongue/throat swelling, SOB or lightheadedness with hypotension: Unknown Has patient had a PCN reaction causing severe rash involving mucus membranes or skin necrosis: Unknown Has patient had a PCN reaction that required hospitalization: Unknown Has patient had a PCN reaction occurring within the last 10 years: Unknown If all of the above answers are "NO", then may proceed with Cephalosporin use.      Medication List    STOP taking these medications   atorvastatin 80 MG tablet Commonly known as: LIPITOR   azithromycin 250 MG tablet Commonly known as: ZITHROMAX   cephALEXin 500 MG capsule Commonly known as: KEFLEX   furosemide 80 MG tablet Commonly known as: Lasix   metoprolol tartrate 25 MG tablet Commonly known as: LOPRESSOR     TAKE these medications   albuterol 108 (90 Base) MCG/ACT inhaler Commonly known as: VENTOLIN HFA Inhale 1-2 puffs into the lungs every 6 (six) hours as needed for wheezing or shortness of breath.   aspirin 81 MG EC tablet Take 81 mg by mouth daily.   budesonide-formoterol 160-4.5 MCG/ACT inhaler Commonly known as: Symbicort Inhale 2 puffs into the lungs in the morning and at bedtime.   calcium carbonate 1250 (500 Ca) MG tablet Commonly known as: OS-CAL - dosed in mg of  elemental calcium Take 1 tablet by mouth daily.   clopidogrel 75 MG tablet Commonly known as: PLAVIX Take 1 tablet (75 mg total) by mouth daily.   docusate sodium 100 MG capsule Commonly known as: COLACE Take 1 capsule (100 mg total) by mouth 2 (two) times daily. What changed:   when to take this  reasons to take this   ferrous sulfate 325 (65 FE) MG tablet Take 1 tablet (325 mg total) by mouth every other day.   fluticasone 27.5 MCG/SPRAY nasal spray Commonly known as: VERAMYST Place 2 sprays into the nose daily as needed for rhinitis or allergies.   gabapentin 300 MG capsule Commonly known as: NEURONTIN Take 900 mg by mouth 2 (two) times daily.   guaiFENesin-dextromethorphan 100-10 MG/5ML syrup Commonly known as: ROBITUSSIN DM Take 5 mLs by mouth every 4 (four) hours as needed for cough.   insulin aspart 100 UNIT/ML injection Commonly known as: novoLOG Inject 0-20 Units into the skin 3 (three) times daily with meals.   insulin aspart protamine- aspart (70-30) 100 UNIT/ML injection Commonly known as: NovoLOG Mix 70/30 Inject 0.55 mLs (55 Units total) into the skin 2 (two) times daily with a meal.   ketoconazole 2 % cream  Commonly known as: NIZORAL Apply 1 application topically daily. Apply twice daily for 2 weeks to the ears   losartan 25 MG tablet Commonly known as: COZAAR Take 0.5 tablets (12.5 mg total) by mouth daily. What changed: how much to take   MAGNESIUM PO Take 400 mg by mouth daily.   metFORMIN 1000 MG tablet Commonly known as: GLUCOPHAGE Take 1,000 mg by mouth 2 (two) times daily with a meal.   metoprolol succinate 25 MG 24 hr tablet Commonly known as: TOPROL-XL Take 25 mg by mouth 2 (two) times daily.   omeprazole 40 MG capsule Commonly known as: PRILOSEC Take 40 mg by mouth daily.   polyethylene glycol 17 g packet Commonly known as: MIRALAX / GLYCOLAX Take 17 g by mouth daily as needed for mild constipation.   potassium chloride SA 20  MEQ tablet Commonly known as: KLOR-CON Take 1 tablet (20 mEq total) by mouth daily.   rosuvastatin 40 MG tablet Commonly known as: CRESTOR Take 1 tablet (40 mg total) by mouth at bedtime.   Tab-A-Vite Tabs Take 1 tablet by mouth daily.   tamsulosin 0.4 MG Caps capsule Commonly known as: FLOMAX Take 0.4 mg by mouth daily.   torsemide 20 MG tablet Commonly known as: DEMADEX Take 2 tablets (40 mg total) by mouth 2 (two) times daily.   traMADol 50 MG tablet Commonly known as: ULTRAM Take 1-2 tablets (50-100 mg total) by mouth every 4 (four) hours as needed for moderate pain.   venlafaxine XR 150 MG 24 hr capsule Commonly known as: EFFEXOR-XR Take 150 mg by mouth daily with breakfast.       Follow-up Information    Vyas, Dhruv B, MD Follow up in 1 week(s).   Specialty: Internal Medicine Contact information: [place] [address] [phone]        Arnoldo Lenis, MD Follow up in 2 week(s).   Specialty: Cardiology Contact information: [place] [address] [phone]              Allergies  Allergen Reactions  . Ace Inhibitors Swelling and Cough  . Other Itching    [name]  . Penicillins     Has patient had a PCN reaction causing immediate rash, facial/tongue/throat swelling, SOB or lightheadedness with hypotension: Unknown Has patient had a PCN reaction causing severe rash involving mucus membranes or skin necrosis: Unknown Has patient had a PCN reaction that required hospitalization: Unknown Has patient had a PCN reaction occurring within the last 10 years: Unknown If all of the above answers are "NO", then may proceed with Cephalosporin use.     Consultations:  Cardiology   Procedures/Studies: DG Chest 1 View  Result Date: 08/01/2020 CLINICAL DATA:  Post thoracentesis EXAM: CHEST  1 VIEW COMPARISON:  08/01/2020 FINDINGS: Changes of CABG. Very low lung volumes. Patchy left lung airspace disease and right basilar  opacities may reflect atelectasis or infiltrates. Suspect small left pleural effusion. No pneumothorax following thoracentesis. IMPRESSION: No pneumothorax following left thoracentesis. Small left pleural effusion. Patchy bilateral airspace disease, left greater than right, atelectasis versus infiltrates/pneumonia. Very low lung volumes. Electronically Signed   By: Rolm Baptise M.D.   On: 08/01/2020 09:33   CT Angio Chest PE W and/or Wo Contrast  Result Date: 08/01/2020 CLINICAL DATA:  Dyspnea EXAM: CT ANGIOGRAPHY CHEST WITH CONTRAST TECHNIQUE: Multidetector CT imaging of the chest was performed using the standard protocol during bolus administration of intravenous contrast. Multiplanar CT image reconstructions  and MIPs were obtained to evaluate the vascular anatomy. CONTRAST:  125mL OMNIPAQUE IOHEXOL 350 MG/ML SOLN COMPARISON:  03/05/2020 FINDINGS: Cardiovascular: There is adequate opacification of the pulmonary arterial tree. No intraluminal filling defect is identified to suggest acute pulmonary embolism. The central pulmonary arteries are of normal caliber. There is extensive coronary artery calcification within the native coronary arteries. Interval coronary artery bypass grafting has been performed. 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 Mild atherosclerotic calcification is seen within the thoracic aorta which is of normal caliber. Bovine arch anatomy noted. Mediastinum/Nodes: No pathologic adenopathy within the thorax. Esophagus unremarkable. Lungs/Pleura: Moderate left pleural effusion is present with compressive atelectasis of the left lung. The right lung is clear. No pneumothorax. No pleural effusion on the right. The central airways are widely patent. Upper Abdomen: No acute abnormality. Musculoskeletal: No acute bone abnormality. Review of the MIP images confirms the above findings. IMPRESSION: Interval coronary artery bypass  grafting. Sternotomy fragments demonstrate nonunion as no bridging callus is yet identified. Moderate left pleural effusion with associated compressive atelectasis of the left lung. No pulmonary embolism. Aortic Atherosclerosis (ICD10-I70.0). Electronically Signed   By: Fidela Salisbury MD   On: 08/01/2020 05:32   DG Chest Port 1 View  Result Date: 08/01/2020 CLINICAL DATA:  Dyspnea EXAM: PORTABLE CHEST 1 VIEW COMPARISON:  05/18/2020 FINDINGS: The patient is quite lordotic on this semi upright chest radiograph. Small left pleural effusion is again identified and has slightly increased in size since prior examination. Lungs are otherwise clear. No pneumothorax. No pleural effusion on the right. Median sternotomy has been performed. Cardiac size is likely stable when accounting for altered patient positioning. Pulmonary vascularity is normal. IMPRESSION: Enlarging, small left pleural effusion. Electronically Signed   By: Fidela Salisbury MD   On: 08/01/2020 04:25   ECHOCARDIOGRAM LIMITED  Result Date: 08/03/2020    ECHOCARDIOGRAM LIMITED REPORT   Patient Name:   Shane Chambers Date of Exam: 08/03/2020 Medical Rec #:  [id number]       Height:       69.0 in Accession #:    [id number]      Weight:       298.5 lb Date of Birth:  1961/10/10       BSA:          2.449 m Patient Age:    31 years        BP:           143/73 mmHg Patient Gender: M               HR:           81 bpm. Exam Location:  [name] Procedure: 2D Echo and Intracardiac Opacification Agent Indications:    Dyspnea 786.09 / R06.00  History:        Patient has prior history of Echocardiogram examinations, most                 recent 04/30/2020. CHF, CAD, Prior CABG; Risk Factors:Former                 Smoker, Dyslipidemia, Diabetes and Hypertension.  Sonographer:    [name] RDCS (AE) Referring Phys: [id number] [place]  1. Left ventricular ejection fraction, by estimation, is 55 to 60%. The left ventricle has normal function.  The left ventricle has no regional wall motion abnormalities. There is mild left ventricular hypertrophy. Left ventricular diastolic parameters are  consistent with Grade II diastolic dysfunction (pseudonormalization). Elevated left atrial pressure.  2. There is moderate calcification of the aortic valve. There is moderate thickening of the aortic valve.  3. Limited echo to evaluate LV function FINDINGS  Left Ventricle: Left ventricular ejection fraction, by estimation, is 55 to 60%. The left ventricle has normal function. The left ventricle has no regional wall motion abnormalities. Definity contrast agent was given IV to delineate the left ventricular  endocardial borders. The left ventricular internal cavity size was normal in size. There is mild left ventricular hypertrophy. Left ventricular diastolic parameters are consistent with Grade II diastolic dysfunction (pseudonormalization). Elevated left atrial pressure. Aortic Valve: There is moderate calcification of the aortic valve. There is moderate thickening of the aortic valve. There is moderate aortic valve annular calcification. Aortic valve mean gradient measures 8.1 mmHg. Aortic valve peak gradient measures 13.0 mmHg. Aortic valve area, by VTI measures 1.67 cm. LEFT VENTRICLE PLAX 2D LVIDd:         5.20 cm  Diastology LVIDs:         3.71 cm  LV e' medial:    4.47 cm/s LV PW:         1.30 cm  LV E/e' medial:  23.9 LV IVS:        1.10 cm  LV e' lateral:   9.99 cm/s LVOT diam:     2.10 cm  LV E/e' lateral: 10.7 LV SV:         72 LV SV Index:   30 LVOT Area:     3.46 cm  LEFT ATRIUM         Index LA diam:    5.40 cm 2.20 cm/m  AORTIC VALVE AV Area (Vmax):    1.59 cm AV Area (Vmean):   1.63 cm AV Area (VTI):     1.67 cm AV Vmax:           180.35 cm/s AV Vmean:          137.247 cm/s AV VTI:            0.435 m AV Peak Grad:      13.0 mmHg AV Mean Grad:      8.1 mmHg LVOT Vmax:         82.69 cm/s LVOT Vmean:        64.586 cm/s LVOT VTI:          0.209 m  LVOT/AV VTI ratio: 0.48  AORTA Ao Root diam: 3.30 cm MITRAL VALVE MV Area (PHT): 4.52 cm     SHUNTS MV Decel Time: 168 msec     Systemic VTI:  0.21 m MV E velocity: 107.00 cm/s  Systemic Diam: 2.10 cm MV A velocity: 58.30 cm/s MV E/A ratio:  1.84 Carlyle Dolly MD Electronically signed by Carlyle Dolly MD Signature Date/Time: 08/03/2020/9:46:14 AM    Final    US THORACENTESIS ASP PLEURAL SPACE W/IMG GUIDE  Result Date: 08/01/2020 INDICATION: Left pleural effusion COPD; CHF Post CABG 03/2020 EXAM: ULTRASOUND GUIDED left THORACENTESIS MEDICATIONS: 10 cc 1% lidocaine. COMPLICATIONS: None immediate. PROCEDURE: An ultrasound guided thoracentesis was thoroughly discussed with the patient and questions answered. The benefits, risks, alternatives and complications were also discussed. The patient understands and wishes to proceed with the procedure. Written consent was obtained. Ultrasound was performed to localize and mark an adequate pocket of fluid in the left chest. The area was then prepped and draped in the normal sterile fashion. 1% Lidocaine was used for local anesthesia.  Under ultrasound guidance a 19 G Yueh catheter was introduced. Thoracentesis was performed. The catheter was removed and a dressing applied. FINDINGS: A total of approximately 120 cc of bloody fluid was removed. Samples were sent to the laboratory as requested by the clinical team. IMPRESSION: Successful ultrasound guided left thoracentesis yielding 120 cc of pleural fluid. Read by Lavonia Drafts [facility] Electronically Signed   By: Jerilynn Mages.  Shick M.D.   On: 08/01/2020 09:22     Discharge Exam: Vitals:   08/07/20 0534 08/07/20 0819  BP: (!) 146/85   Pulse: 78   Resp: 20   Temp: 98.3 F (36.8 C)   SpO2: 97% 96%   Vitals:   08/06/20 1949 08/06/20 2048 08/07/20 0534 08/07/20 0819  BP:  122/67 (!) 146/85   Pulse:  79 78   Resp:  20 20   Temp:  98 F (36.7 C) 98.3 F (36.8 C)   TempSrc:  Oral    SpO2: 98% 100% 97% 96%  Weight:    131.3 kg   Height:        General: Pt is alert, awake, not in acute distress Cardiovascular: RRR, S1/S2 +, no rubs, no gallops Respiratory: CTA bilaterally, no wheezing, no rhonchi Abdominal: Soft, NT, ND, bowel sounds + Extremities: no edema, no cyanosis    The results of significant diagnostics from this hospitalization (including imaging, microbiology, ancillary and laboratory) are listed below for reference.     Microbiology: Recent Results (from the past 240 hour(s))  SARS Coronavirus 2 by RT PCR (hospital order, performed in [facility] hospital lab) Nasopharyngeal Nasopharyngeal Swab     Status: None   Collection Time: 08/01/20  3:42 AM   Specimen: Nasopharyngeal Swab  Result Value Ref Range Status   SARS Coronavirus 2 NEGATIVE NEGATIVE Final    Comment: (NOTE) SARS-CoV-2 target nucleic acids are NOT DETECTED.  The SARS-CoV-2 RNA is generally detectable in upper and lower respiratory specimens during the acute phase of infection. The lowest concentration of SARS-CoV-2 viral copies this assay can detect is 250 copies / mL. A negative result does not preclude SARS-CoV-2 infection and should not be used as the sole basis for treatment or other patient management decisions.  A negative result may occur with improper specimen collection / handling, submission of specimen other than nasopharyngeal swab, presence of viral mutation(s) within the areas targeted by this assay, and inadequate number of viral copies (<250 copies / mL). A negative result must be combined with clinical observations, patient history, and epidemiological information.  Fact Sheet for Patients:   StrictlyIdeas.no  Fact Sheet for Healthcare Providers: BankingDealers.co.za  This test is not yet approved or  cleared by the [place] FDA and has been authorized for detection and/or diagnosis of SARS-CoV-2 by FDA under an Emergency Use Authorization  (EUA).  This EUA will remain in effect (meaning this test can be used) for the duration of the COVID-19 declaration under Section 564(b)(1) of the Act, 21 U.S.C. section 360bbb-3(b)(1), unless the authorization is terminated or revoked sooner.  Performed at [facility], [address], [place], [place] [address]   Gram stain     Status: None   Collection Time: 08/01/20  9:07 AM   Specimen: Pleura; Body Fluid  Result Value Ref Range Status   Specimen Description PLEURAL  Final   Special Requests NONE  Final   Gram Stain   Final    CYTOSPIN SMEAR NO ORGANISMS SEEN WBC PRESENT,BOTH PMN AND MONONUCLEAR Performed at [facility]  [facility], [address] [place], [place] [address]    Report Status 08/01/2020 FINAL  Final  Culture, body fluid-bottle     Status: None   Collection Time: 08/01/20  9:07 AM   Specimen: Pleura  Result Value Ref Range Status   Specimen Description PLEURAL  Final   Special Requests NONE  Final   Culture   Final    NO GROWTH 5 DAYS Performed at [facility], [address], [place], [place] [address]    Report Status 08/06/2020 FINAL  Final  MRSA PCR Screening     Status: Abnormal   Collection Time: 08/02/20  5:00 AM   Specimen: Nasal Mucosa; Nasopharyngeal  Result Value Ref Range Status   MRSA by PCR POSITIVE (A) NEGATIVE Final    Comment:        The GeneXpert MRSA Assay (FDA approved for NASAL specimens only), is one component of a comprehensive MRSA colonization surveillance program. It is not intended to diagnose MRSA infection nor to guide or monitor treatment for MRSA infections. RESULT CALLED TO, READ BACK BY AND VERIFIED WITH: DAVIS,C@0830  BY MATTHEWS, B 9.23.2021 Performed at [place] [place], [place] [address]      Labs: BNP (last 3 results) Recent Labs    04/29/20 1330 05/18/20 1602 08/01/20 0346  BNP 210.0* 144.0* 283.1*   Basic Metabolic Panel: Recent Labs  Lab 08/03/20 0349 08/04/20 0459 08/05/20 0724  08/06/20 0610 08/07/20 0255  NA 137 137 135 138 136  K 3.8 3.8 3.8 3.7 3.9  CL 97* 95* 97* 99 97*  CO2 29 29 28 28 28   GLUCOSE 124* 109* 104* 157* 125*  BUN 42* 39* 31* 29* 36*  CREATININE 1.19 1.06 1.00 1.03 1.17  CALCIUM 9.1 9.4 9.6 9.2 9.4  MG 2.0 2.0 2.0 2.1 2.0   Liver Function Tests: Recent Labs  Lab 08/01/20 0346  AST 27  ALT 20  ALKPHOS 108  BILITOT 0.7  PROT 7.9  ALBUMIN 3.8   No results for input(s): LIPASE, AMYLASE in the last 168 hours. No results for input(s): AMMONIA in the last 168 hours. CBC: Recent Labs  Lab 08/01/20 0346 08/02/20 0428 08/03/20 0349 08/04/20 0459  WBC 16.7* 17.5* 15.2* 14.8*  NEUTROABS 11.2*  --   --   --   HGB 10.9* 9.8* 10.2* 10.5*  HCT 36.4* 32.1* 34.5* 34.7*  MCV 86.1 85.1 87.3 85.3  PLT 471* 446* 415* 487*   Cardiac Enzymes: No results for input(s): CKTOTAL, CKMB, CKMBINDEX, TROPONINI in the last 168 hours. BNP: Invalid input(s): POCBNP CBG: Recent Labs  Lab 08/06/20 0749 08/06/20 1143 08/06/20 1739 08/06/20 2046 08/07/20 0759  GLUCAP 157* 119* 100* 128* 121*   D-Dimer No results for input(s): DDIMER in the last 72 hours. Hgb A1c No results for input(s): HGBA1C in the last 72 hours. Lipid Profile No results for input(s): CHOL, HDL, LDLCALC, TRIG, CHOLHDL, LDLDIRECT in the last 72 hours. Thyroid function studies No results for input(s): TSH, T4TOTAL, T3FREE, THYROIDAB in the last 72 hours.  Invalid input(s): FREET3 Anemia work up No results for input(s): VITAMINB12, FOLATE, FERRITIN, TIBC, IRON, RETICCTPCT in the last 72 hours. Urinalysis    Component Value Date/Time   COLORURINE YELLOW 03/05/2020 1251   APPEARANCEUR CLEAR 03/05/2020 1251   LABSPEC 1.014 03/05/2020 1251   PHURINE 7.0 03/05/2020 1251   GLUCOSEU >=500 (A) 03/05/2020 1251   HGBUR NEGATIVE 03/05/2020 1251   BILIRUBINUR NEGATIVE 03/05/2020 1251   [place] 03/05/2020 1251  PROTEINUR 100 (A) 03/05/2020 1251   NITRITE NEGATIVE  03/05/2020 1251   LEUKOCYTESUR NEGATIVE 03/05/2020 1251   Sepsis Labs Invalid input(s): PROCALCITONIN,  WBC,  LACTICIDVEN Microbiology Recent Results (from the past 240 hour(s))  SARS Coronavirus 2 by RT PCR (hospital order, performed in [facility] hospital lab) Nasopharyngeal Nasopharyngeal Swab     Status: None   Collection Time: 08/01/20  3:42 AM   Specimen: Nasopharyngeal Swab  Result Value Ref Range Status   SARS Coronavirus 2 NEGATIVE NEGATIVE Final    Comment: (NOTE) SARS-CoV-2 target nucleic acids are NOT DETECTED.  The SARS-CoV-2 RNA is generally detectable in upper and lower respiratory specimens during the acute phase of infection. The lowest concentration of SARS-CoV-2 viral copies this assay can detect is 250 copies / mL. A negative result does not preclude SARS-CoV-2 infection and should not be used as the sole basis for treatment or other patient management decisions.  A negative result may occur with improper specimen collection / handling, submission of specimen other than nasopharyngeal swab, presence of viral mutation(s) within the areas targeted by this assay, and inadequate number of viral copies (<250 copies / mL). A negative result must be combined with clinical observations, patient history, and epidemiological information.  Fact Sheet for Patients:   StrictlyIdeas.no  Fact Sheet for Healthcare Providers: BankingDealers.co.za  This test is not yet approved or  cleared by the [place] FDA and has been authorized for detection and/or diagnosis of SARS-CoV-2 by FDA under an Emergency Use Authorization (EUA).  This EUA will remain in effect (meaning this test can be used) for the duration of the COVID-19 declaration under Section 564(b)(1) of the Act, 21 U.S.C. section 360bbb-3(b)(1), unless the authorization is terminated or revoked sooner.  Performed at [facility], [address], [place], [place]  [address]   Gram stain     Status: None   Collection Time: 08/01/20  9:07 AM   Specimen: Pleura; Body Fluid  Result Value Ref Range Status   Specimen Description PLEURAL  Final   Special Requests NONE  Final   Gram Stain   Final    CYTOSPIN SMEAR NO ORGANISMS SEEN WBC PRESENT,BOTH PMN AND MONONUCLEAR Performed at [facility], [address], [place], [place] [address]    Report Status 08/01/2020 FINAL  Final  Culture, body fluid-bottle     Status: None   Collection Time: 08/01/20  9:07 AM   Specimen: Pleura  Result Value Ref Range Status   Specimen Description PLEURAL  Final   Special Requests NONE  Final   Culture   Final    NO GROWTH 5 DAYS Performed at [facility], [address], [place], [place] [address]    Report Status 08/06/2020 FINAL  Final  MRSA PCR Screening     Status: Abnormal   Collection Time: 08/02/20  5:00 AM   Specimen: Nasal Mucosa; Nasopharyngeal  Result Value Ref Range Status   MRSA by PCR POSITIVE (A) NEGATIVE Final    Comment:        The GeneXpert MRSA Assay (FDA approved for NASAL specimens only), is one component of a comprehensive MRSA colonization surveillance program. It is not intended to diagnose MRSA infection nor to guide or monitor treatment for MRSA infections. RESULT CALLED TO, READ BACK BY AND VERIFIED WITH: DAVIS,C@[id number]  BY MATTHEWS, B 9.23.2021 Performed at [facility], [address] [place], [place] [address]      Time coordinating discharge: 35 minutes  SIGNED:   Rodena Goldmann,  DO [facility] 08/07/2020, 10:55 AM  If 7PM-7AM, please contact night-coverage [email or web address]

## 2020-08-07 NOTE — Progress Notes (Signed)
Appointment made for out patient PT for Oct 5th at 11:30. Added to AVS, RN updated to review. Called [facility] to set up medicaid bus for the appointment.  His insurance will pay and has their own transportation. Patient given number to call [phone]. After speaking with Melissa at [facility] about patient living conditions. They faxed over a list to provide to patient. Patient is alert and oriented and can choose to return to that environment. He refused SNF.

## 2020-08-14 ENCOUNTER — Ambulatory Visit (HOSPITAL_COMMUNITY): Payer: Medicaid Other | Attending: Internal Medicine | Admitting: Physical Therapy

## 2020-08-14 ENCOUNTER — Ambulatory Visit: Payer: Medicaid Other | Admitting: Cardiology

## 2020-08-14 ENCOUNTER — Encounter (HOSPITAL_COMMUNITY): Payer: Self-pay | Admitting: Physical Therapy

## 2020-08-14 ENCOUNTER — Other Ambulatory Visit: Payer: Self-pay

## 2020-08-14 DIAGNOSIS — M25512 Pain in left shoulder: Secondary | ICD-10-CM | POA: Diagnosis present

## 2020-08-14 DIAGNOSIS — M25511 Pain in right shoulder: Secondary | ICD-10-CM | POA: Diagnosis present

## 2020-08-14 DIAGNOSIS — R29898 Other symptoms and signs involving the musculoskeletal system: Secondary | ICD-10-CM | POA: Diagnosis present

## 2020-08-14 DIAGNOSIS — R2689 Other abnormalities of gait and mobility: Secondary | ICD-10-CM | POA: Insufficient documentation

## 2020-08-14 DIAGNOSIS — R262 Difficulty in walking, not elsewhere classified: Secondary | ICD-10-CM | POA: Diagnosis present

## 2020-08-14 DIAGNOSIS — R278 Other lack of coordination: Secondary | ICD-10-CM | POA: Insufficient documentation

## 2020-08-14 DIAGNOSIS — G8929 Other chronic pain: Secondary | ICD-10-CM | POA: Insufficient documentation

## 2020-08-14 DIAGNOSIS — M25611 Stiffness of right shoulder, not elsewhere classified: Secondary | ICD-10-CM | POA: Diagnosis present

## 2020-08-14 DIAGNOSIS — M6281 Muscle weakness (generalized): Secondary | ICD-10-CM

## 2020-08-14 DIAGNOSIS — M25612 Stiffness of left shoulder, not elsewhere classified: Secondary | ICD-10-CM | POA: Insufficient documentation

## 2020-08-14 NOTE — Therapy (Signed)
[place], [place], [address] Phone: [phone]   Fax:  [phone]  Physical Therapy Evaluation  Patient Details  Name: Shane Chambers MRN: [id number] Date of Birth: 04-11-1961 Referring Provider (PT): Pratik Manuella Ghazi DO   Encounter Date: 08/14/2020   PT End of Session - 08/14/20 1218    Visit Number 1    Number of Visits 12    Date for PT Re-Evaluation 09/25/20    Authorization Type [place] medicaid [employer] (auth required)    PT Start Time 1132    PT Stop Time 1213    PT Time Calculation (min) 41 min    Equipment Utilized During Treatment Gait belt    Activity Tolerance Patient limited by fatigue;Patient tolerated treatment well    Behavior During Therapy [facility] for tasks assessed/performed           Past Medical History:  Diagnosis Date  . CAD (coronary artery disease)    a. s/p CABG in 03/2020 with LIMA-LAD, RIMA-PL, RA-[id number]-OM1  . Depression   . Diabetes mellitus without complication ([place])   . Hyperlipidemia 12/01/2019  . Hypertension   . Ischemic cardiomyopathy    a. EF 20-25% by echo in 02/2020 b. at 40% by echo in 03/2020 c. EF normalized to 60-65% by echo in 04/2020  . Type 2 diabetes mellitus ([place])     Past Surgical History:  Procedure Laterality Date  . CORONARY ARTERY BYPASS GRAFT N/A 03/13/2020   Procedure: CORONARY ARTERY BYPASS GRAFTING (CABG) times five using bilateral Internal mammary arteries and left radial artery;  Surgeon: [name], MD;  Location: [facility];  Service: Open Heart Surgery;  Laterality: N/A;  . DENTAL SURGERY    . RADIAL ARTERY HARVEST Left 03/13/2020   Procedure: RADIAL ARTERY HARVEST,;  Surgeon: [name], MD;  Location: [facility];  Service: Open Heart Surgery;  Laterality: Left;  . RIGHT/LEFT HEART CATH AND CORONARY ANGIOGRAPHY N/A 03/07/2020   Procedure: RIGHT/LEFT HEART CATH AND CORONARY ANGIOGRAPHY;  Surgeon: Sherren Mocha, MD;  Location: [facility];   Service: Cardiovascular;  Laterality: N/A;  . TEE WITHOUT CARDIOVERSION N/A 03/13/2020   Procedure: TRANSESOPHAGEAL ECHOCARDIOGRAM (TEE);  Surgeon: [name], MD;  Location: [facility];  Service: Open Heart Surgery;  Laterality: N/A;    There were no vitals filed for this visit.    Subjective Assessment - 08/14/20 1129    Subjective Patient is a 59 y.o. male who presents to physical therapy with c/o impaired walking. He states he has lost his ability to walk after several years of being disabled. He has history of open heart surgery and now currently problems with his hands. He is not able to write. He was supposed to have HHPT but the agency did not want to come out due to the physical condition of the house. He is able to walk with RW about 20 feet in his house. He has to crawl up the stairs to get into his house and down on his butt to get out. He is supposed to be in a wheel chair due to frequent falls. He falls a lot with most recent being about 1 week ago.    Pertinent History falls, quintuple bybass    Limitations Walking;House hold activities;Standing;Lifting    How long can you walk comfortably? 20 feet    Patient Stated Goals to gain back some function    Currently in Pain? No/denies  [facility] PT Assessment - 08/14/20 0001      Assessment   Medical Diagnosis COPD, HLD, weakness, gait impairment    Referring Provider (PT) Pratik Manuella Ghazi DO    Onset Date/Surgical Date 08/14/14    Prior Therapy yes      Precautions   Precautions Fall      Restrictions   Weight Bearing Restrictions No      Balance Screen   Has the patient fallen in the past 6 months Yes    How many times? 12 with several close calls    Has the patient had a decrease in activity level because of a fear of falling?  Yes    Is the patient reluctant to leave their home because of a fear of falling?  No      Prior Function   Level of Independence Needs assistance with ADLs;Needs assistance with  homemaking;Needs assistance with transfers;Needs assistance with gait    Vocation Retired      Charity fundraiser Status Within Functional Limits for tasks assessed      Observation/Other Assessments   Observations Seated in wheel chair    Focus on Therapeutic Outcomes (FOTO)  n/a      ROM / Strength   AROM / PROM / Strength Strength      Strength   Strength Assessment Site Hip;Knee;Ankle    Right/Left Hip Right;Left    Right Hip Flexion 4+/5    Left Hip Flexion 4+/5    Right/Left Knee Right;Left    Right Knee Flexion 4+/5    Right Knee Extension 4+/5    Left Knee Flexion 4+/5    Left Knee Extension 4+/5    Right/Left Ankle Right;Left    Right Ankle Dorsiflexion 4/5    Left Ankle Dorsiflexion 4/5      Special Tests   Other special tests TUG 1:50, slow, labored, fatigued      Transfers   Transfers Sit to Stand    Sit to Stand 4: Min assist;3: Mod assist    Sit to Stand Details (indicate cue type and reason) with RW      Ambulation/Gait   Ambulation/Gait Yes    Ambulation/Gait Assistance 4: Min guard;4: Min assist    Ambulation/Gait Assistance Details verbal cueing for staying inside RW    Ambulation Distance (Feet) 20 Feet    Assistive device Rolling walker    Gait Pattern Decreased step length - right;Decreased step length - left;Decreased hip/knee flexion - right;Decreased hip/knee flexion - left;Right foot flat;Left foot flat;Poor foot clearance - left;Poor foot clearance - right;Wide base of support;Trunk flexed    Ambulation Surface Level;Indoor    Gait velocity decreased    Gait Comments TUG, fatigued following                      Objective measurements completed on examination: See above findings.       [facility] Adult PT Treatment/Exercise - 08/14/20 0001      Exercises   Exercises Knee/Hip      Knee/Hip Exercises: Seated   Long Arc Quad Both;1 set;20 reps    Other Seated Knee/Hip Exercises ankle pumps 1x20 bilateral    Marching  Both;1 set;20 reps                  PT Education - 08/14/20 1128    Education Details Patient educated on exam findings, POC, scope of PT, initial HEP    Person(s) Educated Patient  Methods Explanation;Demonstration;Handout    Comprehension Verbalized understanding;Returned demonstration            PT Short Term Goals - 08/14/20 1238      PT SHORT TERM GOAL #1   Title Patient will be independent with HEP in order to improve functional outcomes.    Time 3    Period Weeks    Status New    Target Date 09/04/20      PT SHORT TERM GOAL #2   Title Patient will report at least 25% improvement in symptoms for improved quality of life.    Time 3    Period Weeks    Status New    Target Date 09/04/20             PT Long Term Goals - 08/14/20 1239      PT LONG TERM GOAL #1   Title Patient will report at least 75% improvement in symptoms for improved quality of life.    Time 6    Period Weeks    Status New    Target Date 09/25/20      PT LONG TERM GOAL #2   Title Patient will be able to complete TUG in less than 1:20 in order to show improved ambulation ability.    Time 6    Period Weeks    Status New    Target Date 09/25/20      PT LONG TERM GOAL #3   Title Patient will be able to ambulate at least 200 feet for improved ambulation for appointments.    Time 6    Period Weeks    Status New    Target Date 09/25/20                  Plan - 08/14/20 1227    Clinical Impression Statement Patient is a 59 y.o. male who presents to physical therapy with c/o impaired walking and strength which is chronic in nature but getting worse recently. He presents with pain and deficits in bilateral LE strength, gait, activity tolerance, transfers, endurance, and functional mobility with ADL. He is having to modify and restrict ADL as indicated by TUG performance as well as subjective information and objective measures which is affecting overall participation. Patient  will also benefit from OT consult for ongoing UE weakness and impaired motor skills. Patient will benefit from skilled physical therapy in order to improve function and reduce impairment.    Personal Factors and Comorbidities Comorbidity 3+;Fitness;Behavior Pattern;Past/Current Experience;Social Background;Time since onset of injury/illness/exacerbation;Transportation    Comorbidities neuropathy, CABG, frequent falls, sedentary lifestyle    Examination-Activity Limitations Bathing;Bed Mobility;Caring for Others;Carry;Dressing;Hygiene/Grooming;Lift;Stand;Stairs;Squat;Sit;Locomotion Level;Transfers    Examination-Participation Restrictions Meal Prep;Cleaning;Occupation;Community Activity;Interpersonal Relationship;Laundry;Yard Work;Volunteer;Shop;Driving    Stability/Clinical Decision Making Evolving/Moderate complexity    Clinical Decision Making Moderate    Rehab Potential Fair    PT Frequency 2x / week    PT Duration 6 weeks    PT Treatment/Interventions ADLs/Self Care Home Management;Aquatic Therapy;DME Instruction;Cryotherapy;Electrical Stimulation;Iontophoresis 4mg /ml Dexamethasone;Moist Heat;Traction;Ultrasound;Therapeutic exercise;Therapeutic activities;Functional mobility training;Stair training;Gait training;Neuromuscular re-education;Patient/family education;Manual techniques;Orthotic Fit/Training;Dry needling;Wheelchair mobility training;Passive range of motion;Energy conservation;Splinting;Taping;Spinal Manipulations;Joint Manipulations    PT Next Visit Plan begin LE strengthing, and gait and balance training as tolerated    PT Home Exercise Plan 10/5 LAQ, marches, ankle pumps    Recommended Other Services OT    Consulted and Agree with Plan of Care Patient           Patient will benefit from skilled therapeutic intervention in  order to improve the following deficits and impairments:  Abnormal gait, Difficulty walking, Cardiopulmonary status limiting activity, Decreased endurance,  Impaired UE functional use, Decreased activity tolerance, Decreased balance, Decreased mobility, Decreased strength, Improper body mechanics, Impaired perceived functional ability, Decreased knowledge of use of DME, Pain, Obesity  Visit Diagnosis: Muscle weakness (generalized)  Other abnormalities of gait and mobility  Difficulty in walking, not elsewhere classified     Problem List Patient Active Problem List   Diagnosis Date Noted  . CHF exacerbation ([place]) 08/01/2020  . Visit for wound check 05/28/2020  . OSA (obstructive sleep apnea) 05/24/2020  . Acute exacerbation of chronic obstructive pulmonary disease (COPD) ([place]) 04/29/2020  . Leukocytosis 04/29/2020  . 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 Ischemic cardiomyopathy   . Coronary artery disease   . Acute on chronic combined systolic and diastolic CHF (congestive heart failure) ([place]) 03/05/2020  . Acute hyponatremia 03/05/2020  . Severe Vitamin D deficiency 12/02/2019  . Hypokalemia 12/01/2019  . Hyperlipidemia 12/01/2019  . BPH (benign prostatic hyperplasia) 12/01/2019  . Normocytic anemia 12/01/2019  . Pneumonia due to COVID-19 virus 12/01/2019  . Hypoxia   . SOB (shortness of breath) 11/16/2019  . Hypertension   . Type 2 diabetes mellitus ([place])   . Depression   . Acute bronchitis with bronchospasm 11/15/2019    12:44 PM, 08/14/20 Mearl Latin PT, DPT Physical Therapist at [place], [place], [address] Phone: [phone]   Fax:  [phone]  Name: LENELL LAMA MRN: [id number] Date of Birth: 11-23-1960

## 2020-08-14 NOTE — Patient Instructions (Signed)
Access Code: [email or web address] URL: [email or web address] Date: 08/14/2020 Prepared by: Margie Billet  Exercises Seated March - 3-5 x daily - 7 x weekly - 1 sets - 20 reps Seated Long Arc Quad - 3-5 x daily - 7 x weekly - 1 sets - 20 reps Seated Ankle Pumps - 3-5 x daily - 7 x weekly - 1 sets - 20 reps

## 2020-08-16 ENCOUNTER — Telehealth (HOSPITAL_COMMUNITY): Payer: Self-pay

## 2020-08-16 ENCOUNTER — Ambulatory Visit (HOSPITAL_COMMUNITY): Payer: Medicaid Other

## 2020-08-16 NOTE — Telephone Encounter (Signed)
No show, called and left message concerning missed apt today.  Reminded next apt date and time with contact information including for questions or need to cancel/reschedule.    Ihor Austin, LPTA/CLT; Delana Meyer [phone]

## 2020-08-21 ENCOUNTER — Ambulatory Visit (INDEPENDENT_AMBULATORY_CARE_PROVIDER_SITE_OTHER): Payer: Medicaid Other | Admitting: Family Medicine

## 2020-08-21 ENCOUNTER — Encounter: Payer: Self-pay | Admitting: Family Medicine

## 2020-08-21 VITALS — BP 100/82 | HR 86 | Ht 69.0 in | Wt 283.0 lb

## 2020-08-21 DIAGNOSIS — J9601 Acute respiratory failure with hypoxia: Secondary | ICD-10-CM

## 2020-08-21 DIAGNOSIS — J90 Pleural effusion, not elsewhere classified: Secondary | ICD-10-CM

## 2020-08-21 DIAGNOSIS — G4733 Obstructive sleep apnea (adult) (pediatric): Secondary | ICD-10-CM

## 2020-08-21 DIAGNOSIS — I10 Essential (primary) hypertension: Secondary | ICD-10-CM

## 2020-08-21 DIAGNOSIS — E785 Hyperlipidemia, unspecified: Secondary | ICD-10-CM | POA: Diagnosis not present

## 2020-08-21 DIAGNOSIS — I5032 Chronic diastolic (congestive) heart failure: Secondary | ICD-10-CM

## 2020-08-21 MED ORDER — LOSARTAN POTASSIUM 25 MG PO TABS
12.5000 mg | ORAL_TABLET | Freq: Every day | ORAL | 1 refills | Status: DC
Start: 2020-08-21 — End: 2021-07-12

## 2020-08-21 MED ORDER — METOPROLOL SUCCINATE ER 25 MG PO TB24: 12.5000 mg | ORAL_TABLET | Freq: Two times a day (BID) | ORAL | 1 refills | Status: DC

## 2020-08-21 MED ORDER — CLOPIDOGREL BISULFATE 75 MG PO TABS
75.0000 mg | ORAL_TABLET | Freq: Every day | ORAL | 1 refills | Status: DC
Start: 2020-08-21 — End: 2021-07-03

## 2020-08-21 MED ORDER — POTASSIUM CHLORIDE CRYS ER 20 MEQ PO TBCR: 20.0000 meq | EXTENDED_RELEASE_TABLET | Freq: Every day | ORAL | 1 refills | Status: DC

## 2020-08-21 NOTE — Progress Notes (Signed)
Cardiology Office Note  Date: 08/21/2020   ID: TREYVON BLAHUT, DOB Jun 24, 1961, MRN [id number]  PCP:  [name], MD  Cardiologist:  Carlyle Dolly, MD Electrophysiologist:  None   Chief Complaint: Hospital follow-up for CHF exacerbation  History of Present Illness: Shane Chambers is a 59 y.o. male with a history of CAD with CABG May 1829, grade 2 diastolic CHF, EF of 60 to 65% on echo 04/2020, HTN, HLD, DM 2, GERD, BPH, obesity, mood disorder, IDA.  Presented to the emergency room on 08/01/2020 with shortness of breath which had worsened over the prior week and had swelling to his bilateral lower extremities.  His baseline weight was approximately 2 to 80 pounds and he was up to 298.  He was having some orthopnea as well as a dry cough.  States he has some baseline shortness of breath with exertion which was not unusual for him.  Patient mother stated he had been drinking quite a bit of fluids.  He received both of his Covid vaccinations within the last 2 weeks.  He was admitted with acute on chronic diastolic heart failure and was diuresing well.  A 2D echocardiogram was ordered.  Plans were to continue ongoing diuresis with increasing shortness of breath noted.  He was transitioned to oral torsemide from Lasix by cardiology.  Note stated he would require follow-up labs.  His discharge weight was 289 and he diuresed approximately 13 L of fluid.  Principal discharge diagnosis: Acute hypoxemic respiratory failure secondary to acute on chronic diastolic congestive heart failure exacerbation with moderate left pleural effusion status post thoracentesis secondary to dietary noncompliance.  He was discharged on August 07, 2020  He is here today for follow-up status post hospital stay for 2-week follow-up.  States he still has some shortness of breath and basically feels like he did before he entered the hospital per his statement.  His weight today was 283.  His discharge weight from hospital  was 289.  It was stated during hospital stay his baseline weight was around 280.  States he is noticing some more swelling in his ankles but not as prominent as before.  He had a scheduled sleep study which she was unable to keep.  He is going to follow-up and have a sleep study.  We discussed discharge instructions for CHF.  Advised approximately 60 cc fluid restriction per day, restrict sodium intake.  Weigh daily and advise if 3 pound weight gain in 24 h or 5 pounds in 1 week.  Patient verbalizes understanding.  He continues to have some mild pitting edema in both lower extremities.  He is sitting in a wheelchair.  Past Medical History:  Diagnosis Date  . CAD (coronary artery disease)    a. s/p CABG in 03/2020 with LIMA-LAD, RIMA-PL, RA-D1-RI-OM1  . Depression   . Diabetes mellitus without complication ([place])   . Hyperlipidemia 12/01/2019  . Hypertension   . Ischemic cardiomyopathy    a. EF 20-25% by echo in 02/2020 b. at 40% by echo in 03/2020 c. EF normalized to 60-65% by echo in 04/2020  . Type 2 diabetes mellitus ([place])     Past Surgical History:  Procedure Laterality Date  . CORONARY ARTERY BYPASS GRAFT N/A 03/13/2020   Procedure: CORONARY ARTERY BYPASS GRAFTING (CABG) times five using bilateral Internal mammary arteries and left radial artery;  Surgeon: [name], MD;  Location: [facility];  Service: Open Heart Surgery;  Laterality: N/A;  . DENTAL SURGERY    .  RADIAL ARTERY HARVEST Left 03/13/2020   Procedure: RADIAL ARTERY HARVEST,;  Surgeon: [name], MD;  Location: [facility];  Service: Open Heart Surgery;  Laterality: Left;  . RIGHT/LEFT HEART CATH AND CORONARY ANGIOGRAPHY N/A 03/07/2020   Procedure: RIGHT/LEFT HEART CATH AND CORONARY ANGIOGRAPHY;  Surgeon: Sherren Mocha, MD;  Location: [facility];  Service: Cardiovascular;  Laterality: N/A;  . TEE WITHOUT CARDIOVERSION N/A 03/13/2020   Procedure: TRANSESOPHAGEAL ECHOCARDIOGRAM (TEE);  Surgeon: [name], MD;   Location: [facility];  Service: Open Heart Surgery;  Laterality: N/A;    Current Outpatient Medications  Medication Sig Dispense Refill  . albuterol (VENTOLIN HFA) 108 (90 Base) MCG/ACT inhaler Inhale 1-2 puffs into the lungs every 6 (six) hours as needed for wheezing or shortness of breath. 18 g 3  . aspirin 81 MG EC tablet Take 81 mg by mouth daily.     . budesonide-formoterol (SYMBICORT) 160-4.5 MCG/ACT inhaler Inhale 2 puffs into the lungs in the morning and at bedtime. 1 Inhaler 6  . calcium carbonate (OS-CAL - DOSED IN MG OF ELEMENTAL CALCIUM) 1250 (500 Ca) MG tablet Take 1 tablet by mouth daily.    . clopidogrel (PLAVIX) 75 MG tablet Take 1 tablet (75 mg total) by mouth daily.    Marland Kitchen docusate sodium (COLACE) 100 MG capsule Take 1 capsule (100 mg total) by mouth 2 (two) times daily. (Patient taking differently: Take 100 mg by mouth 2 (two) times daily as needed for mild constipation. ) 10 capsule 0  . ferrous sulfate 325 (65 FE) MG tablet Take 1 tablet (325 mg total) by mouth every other day. 30 tablet 3  . fluticasone (VERAMYST) 27.5 MCG/SPRAY nasal spray Place 2 sprays into the nose daily as needed for rhinitis or allergies.     Marland Kitchen gabapentin (NEURONTIN) 300 MG capsule Take 900 mg by mouth 2 (two) times daily.    Marland Kitchen guaiFENesin-dextromethorphan (ROBITUSSIN DM) 100-10 MG/5ML syrup Take 5 mLs by mouth every 4 (four) hours as needed for cough. 118 mL 0  . insulin aspart (NOVOLOG) 100 UNIT/ML injection Inject 0-20 Units into the skin 3 (three) times daily with meals. 10 mL 11  . insulin aspart protamine- aspart (NOVOLOG MIX 70/30) (70-30) 100 UNIT/ML injection Inject 0.55 mLs (55 Units total) into the skin 2 (two) times daily with a meal. 10 mL 11  . ketoconazole (NIZORAL) 2 % cream Apply 1 application topically daily. Apply twice daily for 2 weeks to the ears 15 g 0  . losartan (COZAAR) 25 MG tablet Take 0.5 tablets (12.5 mg total) by mouth daily. (Patient taking differently: Take 25 mg by mouth daily.  )    . MAGNESIUM PO Take 400 mg by mouth daily.     . metFORMIN (GLUCOPHAGE) 1000 MG tablet Take 1,000 mg by mouth 2 (two) times daily with a meal.    . metoprolol succinate (TOPROL-XL) 25 MG 24 hr tablet Take 25 mg by mouth 2 (two) times daily.    . Multiple Vitamins-Minerals (TAB-A-VITE) TABS Take 1 tablet by mouth daily.    Marland Kitchen omeprazole (PRILOSEC) 40 MG capsule Take 40 mg by mouth daily.    . polyethylene glycol (MIRALAX / GLYCOLAX) 17 g packet Take 17 g by mouth daily as needed for mild constipation. 14 each 0  . potassium chloride SA (KLOR-CON) 20 MEQ tablet Take 1 tablet (20 mEq total) by mouth daily. 90 tablet 3  . rosuvastatin (CRESTOR) 40 MG tablet Take 1 tablet (40 mg total)  by mouth at bedtime. 30 tablet 2  . tamsulosin (FLOMAX) 0.4 MG CAPS capsule Take 0.4 mg by mouth daily.    Marland Kitchen torsemide (DEMADEX) 20 MG tablet Take 2 tablets (40 mg total) by mouth 2 (two) times daily. 120 tablet 1  . traMADol (ULTRAM) 50 MG tablet Take 1-2 tablets (50-100 mg total) by mouth every 4 (four) hours as needed for moderate pain. 30 tablet 0  . venlafaxine XR (EFFEXOR-XR) 150 MG 24 hr capsule Take 150 mg by mouth daily with breakfast.     No current facility-administered medications for this visit.   Allergies:  Ace inhibitors, Other, and Penicillins   Social History: The patient  reports that he quit smoking about 25 years ago. His smoking use included cigarettes. He smoked 1.50 packs per day. He has never used smokeless tobacco. He reports current alcohol use. He reports that he does not use drugs.   Family History: The patient's family history includes Hypertension in his mother.   ROS:  Please see the history of present illness. Otherwise, complete review of systems is positive for none.  All other systems are reviewed and negative.   Physical Exam: VS:  BP 100/82   Pulse 86   Ht 5\' 9"  (1.753 m)   Wt 283 lb (128.4 kg)   SpO2 95%   BMI 41.79 kg/m , BMI Body mass index is 41.79 kg/m.  Wt  Readings from Last 3 Encounters:  08/21/20 283 lb (128.4 kg)  08/07/20 289 lb 7.4 oz (131.3 kg)  06/04/20 (!) 280 lb (127 kg)    General: Patient appears comfortable at rest. Neck: Supple, no elevated JVP or carotid bruits, no thyromegaly. Lungs: Clear to auscultation, nonlabored breathing at rest. Cardiac: Regular rate and rhythm, no S3 or significant systolic murmur, no pericardial rub. Extremities: Mild pitting edema, distal pulses 2+. Skin: Warm and dry. Musculoskeletal: No kyphosis. Neuropsychiatric: Alert and oriented x3, affect grossly appropriate.  ECG:  EKG August 01, 2020 sinus rhythm rate of 82, inferior infarct  Recent Labwork: 03/05/2020: TSH 3.350 08/01/2020: ALT 20; AST 27; B Natriuretic Peptide 141.0 08/04/2020: Hemoglobin 10.5; Platelets 487 08/07/2020: BUN 36; Creatinine, Ser 1.17; Magnesium 2.0; Potassium 3.9; Sodium 136     Component Value Date/Time   CHOL 140 08/02/2020 0428   TRIG 85 08/02/2020 0428   HDL 41 08/02/2020 0428   CHOLHDL 3.4 08/02/2020 0428   VLDL 17 08/02/2020 0428   LDLCALC 82 08/02/2020 0428    Other Studies Reviewed Today:  Echocardiogram 08/03/2020 IMPRESSIONS  1. Left ventricular ejection fraction, by estimation, is 55 to 60%. The  left ventricle has normal function. The left ventricle has no regional  wall motion abnormalities. There is mild left ventricular hypertrophy.  Left ventricular diastolic parameters  are consistent with Grade II diastolic dysfunction (pseudonormalization).  Elevated left atrial pressure.  2. There is moderate calcification of the aortic valve. There is moderate  thickening of the aortic valve.  3. Limited echo to evaluate LV function   Assessment and Plan:  1. Acute hypoxemic respiratory failure (HCC)   2. Chronic diastolic heart failure ([place])   3. Essential hypertension   4. Hyperlipidemia, unspecified hyperlipidemia type   5. Pleural effusion, left   6. OSA (obstructive sleep apnea)    1.  Acute hypoxemic respiratory failure ([place]) Recent admission for acute hypoxemic respiratory failure secondary to chronic diastolic heart failure.  He appears in no acute distress today.  O2 saturation on room air on arrival today was 95%.  2. Chronic diastolic heart failure ([place]) Recent hospital admission for acute on chronic diastolic heart failure.  Patient received IV diuresis and was net -13 L of fluid prior to discharge from the hospital.  His admission weight was 298 which was up from a baseline of 280.  He was discharged on torsemide 40 mg p.o. twice daily.  His weight today is 283.  He states he feels not too differently than he did prior to admission.  He has been taking his Lasix as ordered.  States he notices a little more swelling in his ankles.  Decrease Toprol to 12.5 mg p.o. twice daily.  Continue torsemide 40 mg p.o. twice daily.  Advised him he may take an extra half pill as needed for weight gain of 3 pounds in 24 h or 5 pounds in 1 week.  Please get CBC and BMP  3. Essential hypertension Blood pressure is on the low side today with blood pressure of 100/82.  Decrease Toprol-XL to 12.5 mg p.o. twice daily.  Continue losartan 12.5 mg daily.  4. Hyperlipidemia, unspecified hyperlipidemia type Continue atorvastatin 80 mg p.o. daily  5. Pleural effusion, left Patient had a left pleural effusion.  An ultrasound-guided thoracentesis was performed.  Total of approximately 120 cc of bloody fluid was removed.  Samples were sent to lab as requested.  6. OSA (obstructive sleep apnea) Patient had a prior scheduled sleep study ordered.  Provider at recent admission suspects he does have obstructive sleep apnea.  Patient plans to reschedule his sleep study per his statement.  7.  CAD. Status post coronary artery bypass times 03/2020.  Continue aspirin 81 mg daily.  Plavix 75 mg daily.  Denies any anginal symptoms but does continue with some dyspnea   Medication Adjustments/Labs and Tests  Ordered: Current medicines are reviewed at length with the patient today.  Concerns regarding medicines are outlined above.   Disposition: Follow-up with Dr. Harl Bowie or APP 6 months  Signed, Levell July, NP 08/21/2020 2:21 PM    [facility] [place], [place], [place] [address] Phone: [phone]; Fax: [phone]

## 2020-08-21 NOTE — Patient Instructions (Addendum)
Your physician wants you to follow-up in: [place] will receive a reminder letter in the mail two months in advance. If you don't receive a letter, please call our office to schedule the follow-up appointment.  Your physician has recommended you make the following change in your medication:   DECREASE METOPROLOL 12.5 MG (1/2 TABLET) TWICE DAILY   TAKE AN EXTRA TORSEMIDE 20 MG FOR WEIGHT GAIN OVERNIGHT OF 3LBS OR 5LBS IN A WEEK   Your physician recommends that you return for lab work in: [email or web address]  Thank you for choosing [facility]!!

## 2020-08-21 NOTE — Addendum Note (Signed)
Addended by: Julian Hy T on: 08/21/2020 03:42 PM   Modules accepted: Orders

## 2020-08-22 ENCOUNTER — Other Ambulatory Visit: Payer: Self-pay

## 2020-08-22 ENCOUNTER — Ambulatory Visit (HOSPITAL_COMMUNITY): Payer: Medicaid Other

## 2020-08-22 ENCOUNTER — Encounter (HOSPITAL_COMMUNITY): Payer: Self-pay

## 2020-08-22 DIAGNOSIS — M6281 Muscle weakness (generalized): Secondary | ICD-10-CM | POA: Diagnosis not present

## 2020-08-22 DIAGNOSIS — R2689 Other abnormalities of gait and mobility: Secondary | ICD-10-CM

## 2020-08-22 DIAGNOSIS — R262 Difficulty in walking, not elsewhere classified: Secondary | ICD-10-CM

## 2020-08-22 NOTE — Therapy (Signed)
[facility] [address] [place], [place], [address] Phone: [phone]   Fax:  [phone]  Physical Therapy Treatment  Patient Details  Name: Shane Chambers MRN: [id number] Date of Birth: [birth year] Referring Provider (PT): Pratik Manuella Ghazi DO   Encounter Date: 08/22/2020   PT End of Session - 08/22/20 1146    Visit Number 2    Number of Visits 12    Date for PT Re-Evaluation 09/25/20    Authorization Type [place] medicaid [employer] (auth required)    Authorization Time Period First 20 visits approved then required authorization request for additional treatments    PT Start Time 1135    PT Stop Time 1220    PT Time Calculation (min) 45 min    Equipment Utilized During Treatment Gait belt    Activity Tolerance Patient tolerated treatment well;Patient limited by pain;Patient limited by fatigue    Behavior During Therapy [facility] for tasks assessed/performed           Past Medical History:  Diagnosis Date  . CAD (coronary artery disease)    a. s/p CABG in 03/2020 with LIMA-LAD, RIMA-PL, RA-D1-RI-OM1  . Depression   . Diabetes mellitus without complication ([place])   . Hyperlipidemia 12/01/2019  . Hypertension   . Ischemic cardiomyopathy    a. EF 20-25% by echo in 02/2020 b. at 40% by echo in 03/2020 c. EF normalized to 60-65% by echo in 04/2020  . Type 2 diabetes mellitus ([place])     Past Surgical History:  Procedure Laterality Date  . CORONARY ARTERY BYPASS GRAFT N/A 03/13/2020   Procedure: CORONARY ARTERY BYPASS GRAFTING (CABG) times five using bilateral Internal mammary arteries and left radial artery;  Surgeon: [name], MD;  Location: [facility];  Service: Open Heart Surgery;  Laterality: N/A;  . DENTAL SURGERY    . RADIAL ARTERY HARVEST Left 03/13/2020   Procedure: RADIAL ARTERY HARVEST,;  Surgeon: [name], MD;  Location: [facility];  Service: Open Heart Surgery;  Laterality: Left;  . RIGHT/LEFT HEART CATH AND CORONARY ANGIOGRAPHY  N/A 03/07/2020   Procedure: RIGHT/LEFT HEART CATH AND CORONARY ANGIOGRAPHY;  Surgeon: Sherren Mocha, MD;  Location: [facility];  Service: Cardiovascular;  Laterality: N/A;  . TEE WITHOUT CARDIOVERSION N/A 03/13/2020   Procedure: TRANSESOPHAGEAL ECHOCARDIOGRAM (TEE);  Surgeon: [name], MD;  Location: [facility];  Service: Open Heart Surgery;  Laterality: N/A;    There were no vitals filed for this visit.   Subjective Assessment - 08/22/20 1144    Subjective Pt arrived in [facility], stated he has not had any falls since last session here.  Reports minimal walking at home with WC around the house except to bathroom and back.  Reports he has been compliant with current exercises at home.    Pertinent History falls, quintuple bybass    Patient Stated Goals to gain back some function    Currently in Pain? Yes    Pain Score 3     Pain Location Leg    Pain Orientation Right;Left    Pain Descriptors / Indicators Aching    Pain Type Chronic pain    Pain Onset More than a month ago    Pain Frequency Constant                             [facility] Adult PT Treatment/Exercise - 08/22/20 0001      Exercises   Exercises  Knee/Hip      Knee/Hip Exercises: Standing   Knee Flexion Both;5 reps    Knee Flexion Limitations marching alternating with HHA    Hip Abduction Both;5 sets;Knee straight    Abduction Limitations HHA    Hip Extension Both;5 reps;Knee straight    Extension Limitations HHA      Knee/Hip Exercises: Seated   Long Arc Quad Both;1 set;20 reps    Other Seated Knee/Hip Exercises ankle pumps 1x20 bilateral    Marching Both;1 set;20 reps    Sit to Sand 5 reps;with UE support   wheelchair                 PT Education - 08/22/20 1148    Education Details Reviewed goals, educated importance of compliance wiht HEP for maximal benefits, pt able to recall and demonstrate appropriate mechanics with current HEP.  Pt arrived without shoes, stating his feet are too  swollen to fit into shoes.  Educated on benefits with compression garments, measurements complete and given paperwork for knee high compression garments.  Pt stated he would be interested in referral for lymphedema care.    Person(s) Educated Patient    Methods Explanation;Demonstration;Handout    Comprehension Verbalized understanding;Returned demonstration            PT Short Term Goals - 08/14/20 1238      PT SHORT TERM GOAL #1   Title Patient will be independent with HEP in order to improve functional outcomes.    Time 3    Period Weeks    Status New    Target Date 09/04/20      PT SHORT TERM GOAL #2   Title Patient will report at least 25% improvement in symptoms for improved quality of life.    Time 3    Period Weeks    Status New    Target Date 09/04/20             PT Long Term Goals - 08/14/20 1239      PT LONG TERM GOAL #1   Title Patient will report at least 75% improvement in symptoms for improved quality of life.    Time 6    Period Weeks    Status New    Target Date 09/25/20      PT LONG TERM GOAL #2   Title Patient will be able to complete TUG in less than 1:20 in order to show improved ambulation ability.    Time 6    Period Weeks    Status New    Target Date 09/25/20      PT LONG TERM GOAL #3   Title Patient will be able to ambulate at least 200 feet for improved ambulation for appointments.    Time 6    Period Weeks    Status New    Target Date 09/25/20                 Plan - 08/22/20 1348    Clinical Impression Statement Reviewed goals, educated importance of HEP compliance for maximal benefits, pt able to recall and demonstrate appropriate exercises.  Pt only wearing thick socks, reports unable to fit into shoes due to edema present.  Pt edcuated on benefits with compression garments to assist wih edema, measurements taken and paperwork given for knee high compression hose.  Session focus on LE strengthening and gait training.   Standing hip strengthening exercises complete with Bil UE A, pt fatigues quickly and required seated rest  breaks following 5 reps.  Able to ambulate 73ft prior need to rest.  Pt c/o dizziness upon standing, vitals taken.    Personal Factors and Comorbidities Comorbidity 3+;Fitness;Behavior Pattern;Past/Current Experience;Social Background;Time since onset of injury/illness/exacerbation;Transportation    Comorbidities neuropathy, CABG, frequent falls, sedentary lifestyle    Examination-Activity Limitations Bathing;Bed Mobility;Caring for Others;Carry;Dressing;Hygiene/Grooming;Lift;Stand;Stairs;Squat;Sit;Locomotion Level;Transfers    Examination-Participation Restrictions Meal Prep;Cleaning;Occupation;Community Activity;Interpersonal Relationship;Laundry;Yard Work;Volunteer;Shop;Driving    Stability/Clinical Decision Making Evolving/Moderate complexity    Clinical Decision Making Moderate    Rehab Potential Fair    PT Frequency 2x / week    PT Duration 6 weeks    PT Treatment/Interventions ADLs/Self Care Home Management;Aquatic Therapy;DME Instruction;Cryotherapy;Electrical Stimulation;Iontophoresis 4mg /ml Dexamethasone;Moist Heat;Traction;Ultrasound;Therapeutic exercise;Therapeutic activities;Functional mobility training;Stair training;Gait training;Neuromuscular re-education;Patient/family education;Manual techniques;Orthotic Fit/Training;Dry needling;Wheelchair mobility training;Passive range of motion;Energy conservation;Splinting;Taping;Spinal Manipulations;Joint Manipulations    PT Next Visit Plan begin LE strengthing, and gait and balance training as tolerated; possible lymph referral for edema control (limited insurance coverage)    PT Home Exercise Plan 10/5 LAQ, marches, ankle pumps           Patient will benefit from skilled therapeutic intervention in order to improve the following deficits and impairments:  Abnormal gait, Difficulty walking, Cardiopulmonary status limiting activity,  Decreased endurance, Impaired UE functional use, Decreased activity tolerance, Decreased balance, Decreased mobility, Decreased strength, Improper body mechanics, Impaired perceived functional ability, Decreased knowledge of use of DME, Pain, Obesity  Visit Diagnosis: Muscle weakness (generalized)  Other abnormalities of gait and mobility  Difficulty in walking, not elsewhere classified     Problem List Patient Active Problem List   Diagnosis Date Noted  . CHF exacerbation ([place]) 08/01/2020  . Visit for wound check 05/28/2020  . OSA (obstructive sleep apnea) 05/24/2020  . Acute exacerbation of chronic obstructive pulmonary disease (COPD) ([place]) 04/29/2020  . Leukocytosis 04/29/2020  . 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 Ischemic cardiomyopathy   . Coronary artery disease   . Acute on chronic combined systolic and diastolic CHF (congestive heart failure) ([place]) 03/05/2020  . Acute hyponatremia 03/05/2020  . Severe Vitamin D deficiency 12/02/2019  . Hypokalemia 12/01/2019  . Hyperlipidemia 12/01/2019  . BPH (benign prostatic hyperplasia) 12/01/2019  . Normocytic anemia 12/01/2019  . Pneumonia due to COVID-19 virus 12/01/2019  . Hypoxia   . SOB (shortness of breath) 11/16/2019  . Hypertension   . Type 2 diabetes mellitus ([place])   . Depression   . Acute bronchitis with bronchospasm 11/15/2019   Ihor Austin, LPTA/CLT; CBIS [phone]  Aldona Lento 08/22/2020, 3:44 PM  [facility] [address] [place], [place], [address] Phone: [phone]   Fax:  [phone]  Name: Shane Chambers MRN: [id number] Date of Birth: 25-Jun-1961

## 2020-08-24 ENCOUNTER — Encounter (HOSPITAL_COMMUNITY): Payer: Self-pay

## 2020-08-24 ENCOUNTER — Other Ambulatory Visit: Payer: Self-pay

## 2020-08-24 ENCOUNTER — Ambulatory Visit (HOSPITAL_COMMUNITY): Payer: Medicaid Other

## 2020-08-24 DIAGNOSIS — R2689 Other abnormalities of gait and mobility: Secondary | ICD-10-CM

## 2020-08-24 DIAGNOSIS — M6281 Muscle weakness (generalized): Secondary | ICD-10-CM | POA: Diagnosis not present

## 2020-08-24 DIAGNOSIS — R262 Difficulty in walking, not elsewhere classified: Secondary | ICD-10-CM

## 2020-08-24 NOTE — Therapy (Signed)
[facility] [address] [place], [place], [address] Phone: [phone]   Fax:  [phone]  Physical Therapy Treatment  Patient Details  Name: Shane Chambers MRN: [id number] Date of Birth: 12-18-1960 Referring Provider (PT): Pratik Manuella Ghazi DO   Encounter Date: 08/24/2020   PT End of Session - 08/24/20 1757    Visit Number 3    Number of Visits 12    Date for PT Re-Evaluation 09/25/20    Authorization Type [place] medicaid [employer] (auth required)    Authorization Time Period First 20 visits approved then required authorization request for additional treatments    PT Start Time 1748    PT Stop Time 1830    PT Time Calculation (min) 42 min    Equipment Utilized During Treatment Gait belt    Activity Tolerance Patient tolerated treatment well;Patient limited by pain;Patient limited by fatigue    Behavior During Therapy [facility] for tasks assessed/performed           Past Medical History:  Diagnosis Date  . CAD (coronary artery disease)    a. s/p CABG in 03/2020 with LIMA-LAD, RIMA-PL, RA-D1-RI-OM1  . Depression   . Diabetes mellitus without complication ([place])   . Hyperlipidemia 12/01/2019  . Hypertension   . Ischemic cardiomyopathy    a. EF 20-25% by echo in 02/2020 b. at 40% by echo in 03/2020 c. EF normalized to 60-65% by echo in 04/2020  . Type 2 diabetes mellitus ([place])     Past Surgical History:  Procedure Laterality Date  . CORONARY ARTERY BYPASS GRAFT N/A 03/13/2020   Procedure: CORONARY ARTERY BYPASS GRAFTING (CABG) times five using bilateral Internal mammary arteries and left radial artery;  Surgeon: [name], MD;  Location: [facility];  Service: Open Heart Surgery;  Laterality: N/A;  . DENTAL SURGERY    . RADIAL ARTERY HARVEST Left 03/13/2020   Procedure: RADIAL ARTERY HARVEST,;  Surgeon: [name], MD;  Location: [facility];  Service: Open Heart Surgery;  Laterality: Left;  . RIGHT/LEFT HEART CATH AND CORONARY ANGIOGRAPHY  N/A 03/07/2020   Procedure: RIGHT/LEFT HEART CATH AND CORONARY ANGIOGRAPHY;  Surgeon: Sherren Mocha, MD;  Location: [facility];  Service: Cardiovascular;  Laterality: N/A;  . TEE WITHOUT CARDIOVERSION N/A 03/13/2020   Procedure: TRANSESOPHAGEAL ECHOCARDIOGRAM (TEE);  Surgeon: [name], MD;  Location: [facility];  Service: Open Heart Surgery;  Laterality: N/A;    There were no vitals filed for this visit.   Subjective Assessment - 08/24/20 1756    Subjective Pt arroved om WC.  Stated he has been doing his exercises at home though admits to minimal walking around the thighs.  Stated his thighs feel very stiff and sore today.    Pertinent History falls, quintuple bybass    Patient Stated Goals to gain back some function    Currently in Pain? Yes    Pain Score 3     Pain Location Leg    Pain Orientation Right;Left;Proximal;Anterior    Pain Descriptors / Indicators Aching;Tightness    Pain Type Chronic pain    Pain Onset More than a month ago    Pain Frequency Constant    Aggravating Factors  moving    Pain Relieving Factors pain meds    Effect of Pain on Daily Activities limits                             [facility]  Adult PT Treatment/Exercise - 08/24/20 0001      Ambulation/Gait   Ambulation/Gait Yes    Ambulation/Gait Assistance 4: Min guard;4: Min assist    Ambulation Distance (Feet) 16 Feet   3 sets   Assistive device Rolling walker    Gait Pattern Decreased step length - right;Decreased step length - left;Decreased hip/knee flexion - right;Decreased hip/knee flexion - left;Right foot flat;Left foot flat;Poor foot clearance - left;Poor foot clearance - right;Wide base of support;Trunk flexed    Ambulation Surface Level;Indoor    Gait velocity decreased    Gait Comments limited by fatigue       Knee/Hip Exercises: Standing   Knee Flexion Both;5 reps    Knee Flexion Limitations marching alternating with HHA    Hip Abduction Both;5 sets;Knee straight     Abduction Limitations HHA    Hip Extension Both;5 reps;Knee straight    Extension Limitations HHA      Knee/Hip Exercises: Seated   Sit to Sand 5 reps;with UE support;2 sets                    PT Short Term Goals - 08/14/20 1238      PT SHORT TERM GOAL #1   Title Patient will be independent with HEP in order to improve functional outcomes.    Time 3    Period Weeks    Status New    Target Date 09/04/20      PT SHORT TERM GOAL #2   Title Patient will report at least 25% improvement in symptoms for improved quality of life.    Time 3    Period Weeks    Status New    Target Date 09/04/20             PT Long Term Goals - 08/14/20 1239      PT LONG TERM GOAL #1   Title Patient will report at least 75% improvement in symptoms for improved quality of life.    Time 6    Period Weeks    Status New    Target Date 09/25/20      PT LONG TERM GOAL #2   Title Patient will be able to complete TUG in less than 1:20 in order to show improved ambulation ability.    Time 6    Period Weeks    Status New    Target Date 09/25/20      PT LONG TERM GOAL #3   Title Patient will be able to ambulate at least 200 feet for improved ambulation for appointments.    Time 6    Period Weeks    Status New    Target Date 09/25/20                 Plan - 08/24/20 1833    Clinical Impression Statement Session focus with activity tolerance to improve standing and gait as well as hip strengthening exercises.  Pt limited by LE pain and fatigue required seated rest breaks following 5 reps.  Encouraged to increase gait at home with RW, verbalized understanding.    Personal Factors and Comorbidities Comorbidity 3+;Fitness;Behavior Pattern;Past/Current Experience;Social Background;Time since onset of injury/illness/exacerbation;Transportation    Comorbidities neuropathy, CABG, frequent falls, sedentary lifestyle    Examination-Activity Limitations Bathing;Bed Mobility;Caring for  Others;Carry;Dressing;Hygiene/Grooming;Lift;Stand;Stairs;Squat;Sit;Locomotion Level;Transfers    Examination-Participation Restrictions Meal Prep;Cleaning;Occupation;Community Activity;Interpersonal Relationship;Laundry;Yard Work;Volunteer;Shop;Driving    Stability/Clinical Decision Making Evolving/Moderate complexity    Clinical Decision Making Moderate    Rehab Potential Fair  PT Frequency 2x / week    PT Duration 6 weeks    PT Treatment/Interventions ADLs/Self Care Home Management;Aquatic Therapy;DME Instruction;Cryotherapy;Electrical Stimulation;Iontophoresis 4mg /ml Dexamethasone;Moist Heat;Traction;Ultrasound;Therapeutic exercise;Therapeutic activities;Functional mobility training;Stair training;Gait training;Neuromuscular re-education;Patient/family education;Manual techniques;Orthotic Fit/Training;Dry needling;Wheelchair mobility training;Passive range of motion;Energy conservation;Splinting;Taping;Spinal Manipulations;Joint Manipulations    PT Next Visit Plan begin LE strengthing, and gait and balance training as tolerated; possible lymph referral for edema control (limited insurance coverage)    PT Home Exercise Plan 10/5 LAQ, marches, ankle pumps; 10/15: standing tolerance front of sink           Patient will benefit from skilled therapeutic intervention in order to improve the following deficits and impairments:  Abnormal gait, Difficulty walking, Cardiopulmonary status limiting activity, Decreased endurance, Impaired UE functional use, Decreased activity tolerance, Decreased balance, Decreased mobility, Decreased strength, Improper body mechanics, Impaired perceived functional ability, Decreased knowledge of use of DME, Pain, Obesity  Visit Diagnosis: Muscle weakness (generalized)  Other abnormalities of gait and mobility  Difficulty in walking, not elsewhere classified     Problem List Patient Active Problem List   Diagnosis Date Noted  . CHF exacerbation ([place]) 08/01/2020   . Visit for wound check 05/28/2020  . OSA (obstructive sleep apnea) 05/24/2020  . Acute exacerbation of chronic obstructive pulmonary disease (COPD) ([place]) 04/29/2020  . Leukocytosis 04/29/2020  . 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 Ischemic cardiomyopathy   . Coronary artery disease   . Acute on chronic combined systolic and diastolic CHF (congestive heart failure) ([place]) 03/05/2020  . Acute hyponatremia 03/05/2020  . Severe Vitamin D deficiency 12/02/2019  . Hypokalemia 12/01/2019  . Hyperlipidemia 12/01/2019  . BPH (benign prostatic hyperplasia) 12/01/2019  . Normocytic anemia 12/01/2019  . Pneumonia due to COVID-19 virus 12/01/2019  . Hypoxia   . SOB (shortness of breath) 11/16/2019  . Hypertension   . Type 2 diabetes mellitus ([place])   . Depression   . Acute bronchitis with bronchospasm 11/15/2019   Ihor Austin, LPTA/CLT; CBIS [phone]  Aldona Lento 08/24/2020, 6:38 PM  [facility] [address] [place], [place], [address] Phone: [phone]   Fax:  [phone]  Name: ULES MARSALA MRN: [id number] Date of Birth: 1961/08/30

## 2020-08-27 ENCOUNTER — Other Ambulatory Visit: Payer: Self-pay

## 2020-08-27 ENCOUNTER — Encounter (HOSPITAL_COMMUNITY): Payer: Self-pay

## 2020-08-27 ENCOUNTER — Ambulatory Visit (HOSPITAL_COMMUNITY): Payer: Medicaid Other

## 2020-08-27 DIAGNOSIS — M6281 Muscle weakness (generalized): Secondary | ICD-10-CM

## 2020-08-27 DIAGNOSIS — R2689 Other abnormalities of gait and mobility: Secondary | ICD-10-CM

## 2020-08-27 DIAGNOSIS — R262 Difficulty in walking, not elsewhere classified: Secondary | ICD-10-CM

## 2020-08-27 NOTE — Therapy (Signed)
[facility] [address] [place], [place], [address] Phone: [phone]   Fax:  [phone]  Physical Therapy Treatment  Patient Details  Name: Shane Chambers MRN: [id number] Date of Birth: 1961/11/04 Referring Provider (PT): Pratik Manuella Ghazi DO   Encounter Date: 08/27/2020   PT End of Session - 08/27/20 1107    Visit Number 4    Number of Visits 12    Date for PT Re-Evaluation 09/25/20    Authorization Type [place] medicaid [employer] (auth required)    Authorization Time Period First 20 visits approved then required authorization request for additional treatments    PT Start Time 1107    PT Stop Time 1152    PT Time Calculation (min) 45 min    Equipment Utilized During Treatment Gait belt    Activity Tolerance Patient tolerated treatment well;Patient limited by pain;Patient limited by fatigue    Behavior During Therapy [facility] for tasks assessed/performed           Past Medical History:  Diagnosis Date  . CAD (coronary artery disease)    a. s/p CABG in 03/2020 with LIMA-LAD, RIMA-PL, RA-D1-RI-OM1  . Depression   . Diabetes mellitus without complication ([place])   . Hyperlipidemia 12/01/2019  . Hypertension   . Ischemic cardiomyopathy    a. EF 20-25% by echo in 02/2020 b. at 40% by echo in 03/2020 c. EF normalized to 60-65% by echo in 04/2020  . Type 2 diabetes mellitus ([place])     Past Surgical History:  Procedure Laterality Date  . CORONARY ARTERY BYPASS GRAFT N/A 03/13/2020   Procedure: CORONARY ARTERY BYPASS GRAFTING (CABG) times five using bilateral Internal mammary arteries and left radial artery;  Surgeon: [name], MD;  Location: [facility];  Service: Open Heart Surgery;  Laterality: N/A;  . DENTAL SURGERY    . RADIAL ARTERY HARVEST Left 03/13/2020   Procedure: RADIAL ARTERY HARVEST,;  Surgeon: [name], MD;  Location: [facility];  Service: Open Heart Surgery;  Laterality: Left;  . RIGHT/LEFT HEART CATH AND CORONARY ANGIOGRAPHY  N/A 03/07/2020   Procedure: RIGHT/LEFT HEART CATH AND CORONARY ANGIOGRAPHY;  Surgeon: Sherren Mocha, MD;  Location: [facility];  Service: Cardiovascular;  Laterality: N/A;  . TEE WITHOUT CARDIOVERSION N/A 03/13/2020   Procedure: TRANSESOPHAGEAL ECHOCARDIOGRAM (TEE);  Surgeon: [name], MD;  Location: [facility];  Service: Open Heart Surgery;  Laterality: N/A;    There were no vitals filed for this visit.   Subjective Assessment - 08/27/20 1107    Subjective Pt arrived in [facility].  Stated he has been trying to build some more standing time into his daily routine.    Pertinent History falls, quintuple bybass    Patient Stated Goals to gain back some function    Pain Onset More than a month ago            [facility] Adult PT Treatment/Exercise - 08/27/20 0001      Ambulation/Gait   Ambulation/Gait Yes    Ambulation/Gait Assistance 4: Min guard;4: Min assist    Ambulation Distance (Feet) 78 Feet   16,30,32   Assistive device Rolling walker    Gait Pattern Decreased step length - right;Decreased step length - left;Decreased hip/knee flexion - right;Decreased hip/knee flexion - left;Right foot flat;Left foot flat;Poor foot clearance - left;Poor foot clearance - right;Wide base of support;Trunk flexed    Ambulation Surface Level;Indoor    Gait velocity decreased    Gait Comments limited by fatigue  Knee/Hip Exercises: Standing   Knee Flexion Both;5 reps    Knee Flexion Limitations marching alternating with HHA    Hip Abduction Both;5 sets;Knee straight    Abduction Limitations HHA    Hip Extension Both;5 reps;Knee straight    Extension Limitations HHA      Knee/Hip Exercises: Seated   Sit to Sand 5 reps;with UE support;2 sets             PT Education - 08/27/20 1158    Education Details Discussed purpose and technique of interventions throughout session. Importance of mental health and positivity to contribute to continued physical strengthening and return to improved  function.            PT Short Term Goals - 08/27/20 1108      PT SHORT TERM GOAL #1   Title Patient will be independent with HEP in order to improve functional outcomes.    Time 3    Period Weeks    Status On-going    Target Date 09/04/20      PT SHORT TERM GOAL #2   Title Patient will report at least 25% improvement in symptoms for improved quality of life.    Time 3    Period Weeks    Status On-going    Target Date 09/04/20             PT Long Term Goals - 08/27/20 1108      PT LONG TERM GOAL #1   Title Patient will report at least 75% improvement in symptoms for improved quality of life.    Time 6    Period Weeks    Status On-going      PT LONG TERM GOAL #2   Title Patient will be able to complete TUG in less than 1:20 in order to show improved ambulation ability.    Time 6    Period Weeks    Status On-going      PT LONG TERM GOAL #3   Title Patient will be able to ambulate at least 200 feet for improved ambulation for appointments.    Time 6    Period Weeks    Status On-going             Plan - 08/27/20 1107    Clinical Impression Statement Session focus with activity tolerance to improve standing and gait as well as hip strengthening exercises.  Pt limited by LE pain and fatigue required seated rest breaks following 5 reps but was able to increase gait distance overall and incrementally today.  Encouraged to increase gait and standing time at home with RW, verbalized understanding.    Personal Factors and Comorbidities Comorbidity 3+;Fitness;Behavior Pattern;Past/Current Experience;Social Background;Time since onset of injury/illness/exacerbation;Transportation    Comorbidities neuropathy, CABG, frequent falls, sedentary lifestyle    Examination-Activity Limitations Bathing;Bed Mobility;Caring for Others;Carry;Dressing;Hygiene/Grooming;Lift;Stand;Stairs;Squat;Sit;Locomotion Level;Transfers    Examination-Participation Restrictions Meal  Prep;Cleaning;Occupation;Community Activity;Interpersonal Relationship;Laundry;Yard Work;Volunteer;Shop;Driving    Stability/Clinical Decision Making Evolving/Moderate complexity    Rehab Potential Fair    PT Frequency 2x / week    PT Duration 6 weeks    PT Treatment/Interventions ADLs/Self Care Home Management;Aquatic Therapy;DME Instruction;Cryotherapy;Electrical Stimulation;Iontophoresis 4mg /ml Dexamethasone;Moist Heat;Traction;Ultrasound;Therapeutic exercise;Therapeutic activities;Functional mobility training;Stair training;Gait training;Neuromuscular re-education;Patient/family education;Manual techniques;Orthotic Fit/Training;Dry needling;Wheelchair mobility training;Passive range of motion;Energy conservation;Splinting;Taping;Spinal Manipulations;Joint Manipulations    PT Next Visit Plan begin LE strengthing, and gait and balance training as tolerated; possible lymph referral for edema control (limited insurance coverage)    PT Home Exercise Plan 10/5 LAQ, marches, ankle  pumps; 10/15: standing tolerance front of sink           Patient will benefit from skilled therapeutic intervention in order to improve the following deficits and impairments:  Abnormal gait, Difficulty walking, Cardiopulmonary status limiting activity, Decreased endurance, Impaired UE functional use, Decreased activity tolerance, Decreased balance, Decreased mobility, Decreased strength, Improper body mechanics, Impaired perceived functional ability, Decreased knowledge of use of DME, Pain, Obesity  Visit Diagnosis: Muscle weakness (generalized)  Other abnormalities of gait and mobility  Difficulty in walking, not elsewhere classified     Problem List Patient Active Problem List   Diagnosis Date Noted  . CHF exacerbation ([place]) 08/01/2020  . Visit for wound check 05/28/2020  . OSA (obstructive sleep apnea) 05/24/2020  . Acute exacerbation of chronic obstructive pulmonary disease (COPD) ([place]) 04/29/2020  .  Leukocytosis 04/29/2020  . 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 Ischemic cardiomyopathy   . Coronary artery disease   . Acute on chronic combined systolic and diastolic CHF (congestive heart failure) ([place]) 03/05/2020  . Acute hyponatremia 03/05/2020  . Severe Vitamin D deficiency 12/02/2019  . Hypokalemia 12/01/2019  . Hyperlipidemia 12/01/2019  . BPH (benign prostatic hyperplasia) 12/01/2019  . Normocytic anemia 12/01/2019  . Pneumonia due to COVID-19 virus 12/01/2019  . Hypoxia   . SOB (shortness of breath) 11/16/2019  . Hypertension   . Type 2 diabetes mellitus ([place])   . Depression   . Acute bronchitis with bronchospasm 11/15/2019    Floria Raveling. Hartnett-Rands, MS, PT Per [facility] #[id number] 08/27/2020, 12:00 PM  [facility] [address] [place], [place], [address] Phone: [phone]   Fax:  [phone]  Name: Shane Chambers MRN: [id number] Date of Birth: 09/02/1961

## 2020-08-29 ENCOUNTER — Telehealth (HOSPITAL_COMMUNITY): Payer: Self-pay

## 2020-08-29 ENCOUNTER — Ambulatory Visit (HOSPITAL_COMMUNITY): Payer: Medicaid Other

## 2020-08-29 NOTE — Telephone Encounter (Signed)
pt cancelled appt for today and because the MD told him to stay off his feet

## 2020-09-03 ENCOUNTER — Ambulatory Visit (HOSPITAL_COMMUNITY): Payer: Medicaid Other

## 2020-09-03 ENCOUNTER — Other Ambulatory Visit: Payer: Self-pay

## 2020-09-03 ENCOUNTER — Encounter (HOSPITAL_COMMUNITY): Payer: Self-pay

## 2020-09-03 DIAGNOSIS — M6281 Muscle weakness (generalized): Secondary | ICD-10-CM | POA: Diagnosis not present

## 2020-09-03 DIAGNOSIS — R2689 Other abnormalities of gait and mobility: Secondary | ICD-10-CM

## 2020-09-03 DIAGNOSIS — R262 Difficulty in walking, not elsewhere classified: Secondary | ICD-10-CM

## 2020-09-03 NOTE — Therapy (Signed)
[facility] [address] [place], [place], [address] Phone: [phone]   Fax:  [phone]  Physical Therapy Treatment  Patient Details  Name: Shane Chambers MRN: [id number] Date of Birth: [birth year] Referring Provider (PT): Pratik Manuella Ghazi DO   Encounter Date: 09/03/2020   PT End of Session - 09/03/20 1105    Visit Number 5    Number of Visits 12    Date for PT Re-Evaluation 09/25/20    Authorization Type [place] medicaid [employer] (auth required)    Authorization Time Period First 20 visits approved then required authorization request for additional treatments    PT Start Time 1105    PT Stop Time 1145    PT Time Calculation (min) 40 min    Equipment Utilized During Treatment Gait belt    Activity Tolerance Patient tolerated treatment well;Patient limited by pain;Patient limited by fatigue    Behavior During Therapy [facility] for tasks assessed/performed           Past Medical History:  Diagnosis Date  . CAD (coronary artery disease)    a. s/p CABG in 03/2020 with LIMA-LAD, RIMA-PL, [id number]-OM1  . Depression   . Diabetes mellitus without complication ([place])   . Hyperlipidemia 12/01/2019  . Hypertension   . Ischemic cardiomyopathy    a. EF 20-25% by echo in 02/2020 b. at 40% by echo in 03/2020 c. EF normalized to 60-65% by echo in 04/2020  . Type 2 diabetes mellitus ([place])     Past Surgical History:  Procedure Laterality Date  . CORONARY ARTERY BYPASS GRAFT N/A 03/13/2020   Procedure: CORONARY ARTERY BYPASS GRAFTING (CABG) times five using bilateral Internal mammary arteries and left radial artery;  Surgeon: [name], MD;  Location: [place];  Service: Open Heart Surgery;  Laterality: N/A;  . DENTAL SURGERY    . RADIAL ARTERY HARVEST Left 03/13/2020   Procedure: RADIAL ARTERY HARVEST,;  Surgeon: [name], MD;  Location: [place];  Service: Open Heart Surgery;  Laterality: Left;  . RIGHT/LEFT HEART CATH AND CORONARY ANGIOGRAPHY  N/A 03/07/2020   Procedure: RIGHT/LEFT HEART CATH AND CORONARY ANGIOGRAPHY;  Surgeon: Sherren Mocha, MD;  Location: [facility];  Service: Cardiovascular;  Laterality: N/A;  . TEE WITHOUT CARDIOVERSION N/A 03/13/2020   Procedure: TRANSESOPHAGEAL ECHOCARDIOGRAM (TEE);  Surgeon: [name], MD;  Location: [place];  Service: Open Heart Surgery;  Laterality: N/A;    There were no vitals filed for this visit.   Subjective Assessment - 09/03/20 1104    Subjective Patient arrived in wheelchair. Patient reports physician didn't want him to walk for a couple days due to bleeding of a couple of his wounds. He can participate today now though.    Pertinent History falls, quintuple bybass    Patient Stated Goals to gain back some function    Pain Onset More than a month ago            [facility] Adult PT Treatment/Exercise - 09/03/20 0001      Ambulation/Gait   Ambulation/Gait Yes    Ambulation/Gait Assistance 4: Min guard;4: Min assist    Ambulation Distance (Feet) 120 Feet   46, 44, 30   Assistive device Rolling walker    Gait Pattern Decreased step length - right;Decreased step length - left;Decreased hip/knee flexion - right;Decreased hip/knee flexion - left;Right foot flat;Left foot flat;Poor foot clearance - left;Poor foot clearance - right;Wide base of support;Trunk flexed    Ambulation Surface Level;Indoor  Gait velocity decreased    Gait Comments limited by fatigue       Knee/Hip Exercises: Seated   Sit to Sand 5 reps;with UE support;1 set   1 set of 3 reps           PT Education - 09/03/20 1138    Education Details Discussed purpose and technique of interventions throughout session. Acceptable to have recovery days; won't see progress every day but over time will be able to do more and more with effort.    Person(s) Educated Patient    Methods Explanation    Comprehension Verbalized understanding            PT Short Term Goals - 09/03/20 1105      PT SHORT TERM  GOAL #1   Title Patient will be independent with HEP in order to improve functional outcomes.    Time 3    Period Weeks    Status On-going    Target Date 09/04/20      PT SHORT TERM GOAL #2   Title Patient will report at least 25% improvement in symptoms for improved quality of life.    Time 3    Period Weeks    Status On-going    Target Date 09/04/20             PT Long Term Goals - 09/03/20 1106      PT LONG TERM GOAL #1   Title Patient will report at least 75% improvement in symptoms for improved quality of life.    Time 6    Period Weeks    Status On-going      PT LONG TERM GOAL #2   Title Patient will be able to complete TUG in less than 1:20 in order to show improved ambulation ability.    Time 6    Period Weeks    Status On-going      PT LONG TERM GOAL #3   Title Patient will be able to ambulate at least 200 feet for improved ambulation for appointments.    Time 6    Period Weeks    Status On-going             Plan - 09/03/20 1105    Clinical Impression Statement Session focus with activity tolerance to improve standing and gait as well as hip strengthening exercises. Pt limited by LE pain and fatigue required seated rest breaks following 5 and 3 reps of STS but was able to increase gait distance overall and incrementally again today. Patient required inhalers during session due to shortness of breath. Encouraged to increase gait and standing time at home with RW, verbalized understanding.    Personal Factors and Comorbidities Comorbidity 3+;Fitness;Behavior Pattern;Past/Current Experience;Social Background;Time since onset of injury/illness/exacerbation;Transportation    Comorbidities neuropathy, CABG, frequent falls, sedentary lifestyle    Examination-Activity Limitations Bathing;Bed Mobility;Caring for Others;Carry;Dressing;Hygiene/Grooming;Lift;Stand;Stairs;Squat;Sit;Locomotion Level;Transfers    Examination-Participation Restrictions Meal  Prep;Cleaning;Occupation;Community Activity;Interpersonal Relationship;Laundry;Yard Work;Volunteer;Shop;Driving    Stability/Clinical Decision Making Evolving/Moderate complexity    Rehab Potential Fair    PT Frequency 2x / week    PT Duration 6 weeks    PT Treatment/Interventions ADLs/Self Care Home Management;Aquatic Therapy;DME Instruction;Cryotherapy;Electrical Stimulation;Iontophoresis 4mg /ml Dexamethasone;Moist Heat;Traction;Ultrasound;Therapeutic exercise;Therapeutic activities;Functional mobility training;Stair training;Gait training;Neuromuscular re-education;Patient/family education;Manual techniques;Orthotic Fit/Training;Dry needling;Wheelchair mobility training;Passive range of motion;Energy conservation;Splinting;Taping;Spinal Manipulations;Joint Manipulations    PT Next Visit Plan begin LE strengthing, and gait and balance training as tolerated; possible lymph referral for edema control (limited insurance coverage)    PT Home  Exercise Plan 10/5 LAQ, marches, ankle pumps; 10/15: standing tolerance front of sink           Patient will benefit from skilled therapeutic intervention in order to improve the following deficits and impairments:  Abnormal gait, Difficulty walking, Cardiopulmonary status limiting activity, Decreased endurance, Impaired UE functional use, Decreased activity tolerance, Decreased balance, Decreased mobility, Decreased strength, Improper body mechanics, Impaired perceived functional ability, Decreased knowledge of use of DME, Pain, Obesity  Visit Diagnosis: Other abnormalities of gait and mobility  Difficulty in walking, not elsewhere classified  Muscle weakness (generalized)     Problem List Patient Active Problem List   Diagnosis Date Noted  . CHF exacerbation ([place]) 08/01/2020  . Visit for wound check 05/28/2020  . OSA (obstructive sleep apnea) 05/24/2020  . Acute exacerbation of chronic obstructive pulmonary disease (COPD) ([place]) 04/29/2020  .  Leukocytosis 04/29/2020  . 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 Ischemic cardiomyopathy   . Coronary artery disease   . Acute on chronic combined systolic and diastolic CHF (congestive heart failure) ([place]) 03/05/2020  . Acute hyponatremia 03/05/2020  . Severe Vitamin D deficiency 12/02/2019  . Hypokalemia 12/01/2019  . Hyperlipidemia 12/01/2019  . BPH (benign prostatic hyperplasia) 12/01/2019  . Normocytic anemia 12/01/2019  . Pneumonia due to COVID-19 virus 12/01/2019  . Hypoxia   . SOB (shortness of breath) 11/16/2019  . Hypertension   . Type 2 diabetes mellitus ([place])   . Depression   . Acute bronchitis with bronchospasm 11/15/2019    Floria Raveling. Hartnett-Rands, MS, PT Per Day #[id number] 09/03/2020, 11:54 AM  [place], [place], [address] Phone: [phone]   Fax:  [phone]  Name: Shane Chambers MRN: [id number] Date of Birth: 1961/07/03

## 2020-09-04 ENCOUNTER — Encounter (HOSPITAL_COMMUNITY): Payer: Self-pay

## 2020-09-04 ENCOUNTER — Ambulatory Visit: Payer: Medicaid Other | Admitting: Pulmonary Disease

## 2020-09-04 ENCOUNTER — Ambulatory Visit (HOSPITAL_COMMUNITY): Payer: Medicaid Other

## 2020-09-04 DIAGNOSIS — M25612 Stiffness of left shoulder, not elsewhere classified: Secondary | ICD-10-CM

## 2020-09-04 DIAGNOSIS — R278 Other lack of coordination: Secondary | ICD-10-CM

## 2020-09-04 DIAGNOSIS — M25512 Pain in left shoulder: Secondary | ICD-10-CM

## 2020-09-04 DIAGNOSIS — R29898 Other symptoms and signs involving the musculoskeletal system: Secondary | ICD-10-CM

## 2020-09-04 DIAGNOSIS — M6281 Muscle weakness (generalized): Secondary | ICD-10-CM | POA: Diagnosis not present

## 2020-09-04 DIAGNOSIS — G8929 Other chronic pain: Secondary | ICD-10-CM

## 2020-09-04 DIAGNOSIS — M25611 Stiffness of right shoulder, not elsewhere classified: Secondary | ICD-10-CM

## 2020-09-04 NOTE — Addendum Note (Signed)
Addended by: Ailene Ravel D on: 09/04/2020 05:39 PM   Modules accepted: Orders

## 2020-09-04 NOTE — Patient Instructions (Addendum)
Patient Resources  Suicide Resources If you are depressed or thinking about suicide: Life offers [place] many opportunities to be sad, depressed or lonely. Many times, we don't know what to do or to whom to turn to for advice or support. Many fear that if they share their most intimate thoughts with their friends, they may be ridiculed or hurt. If you are feeling overwhelmed or lonely, and you're thinking about suicide, call the Suicide Hotline NOW. Help is close by. Call 407-735-0138 (1-800-SUICIDE) or visit www.Suicide.[place] - connects you to a crisis center in your area. 9561275883 (1-800-SUICIDE). [employer] - a 24-Hour teen crisis line. 903-764-3076  Domestic Violence / Sexual Assault If you are a victim of domestic violence, call: [employer] Crisis Line: 260 241 4909 Hours of Operation: 24-Hours a day / 7 days a week [employer] Violence Hotline: 209-461-6218 If you are a victim of sexual assault or rape, call: [employer] Crisis Line: 801-430-6372 Hours of Operation: 24-Hours a day / 7 days a week National Sexual Assault Hotline: 269-819-6100  Substance Abuse Addiction in [place] If you live in [place] and are fighting an addiction, you no longer should let it control your life. You can beat addiction through admission to a qualified drug rehab center. With just a simple phone call, you can begin your journey to sobriety and long-term happiness. Outpatient services are also available upon discharge, as well as sober living houses.  Many major health insurance companies offer partial or full coverage for the cost of treatment. Call [phone]  [facility] [place] [facility] [place] comprehensive support services for a wide range of behavioral illnesses. [facility]  Provides access to services for  individuals with mental health, intellectual/developmental disabilities and substance use challenges. [place] website www.[employer].com ACCESS / Call Center Licensed and qualified professionals are available around the clock (24 hours per day, seven days per week, 365 days per year) to assist with behavioral health referrals for services and to respond to questions and needs.    To access care, please contact the toll-free ACCESS line at (340) 206-1133.     1) SHOULDER: Flexion On Table   Place hands on towel placed on table, elbows straight. Lean forward with you upper body, pushing towel away from body.  10 reps per set, 1-2 sets per day  2) Abduction (Passive)   With arm out to side, resting on towel placed on table with palm DOWN, keeping trunk away from table, lean to the side while pushing towel away from body.  Repeat 10 times. Do 1-2 sessions per day.  Copyright  VHI. All rights reserved.     3) Internal Rotation (Assistive)   Seated with elbow bent at right angle and held against side, slide arm on table surface in an inward arc keeping elbow anchored in place. Repeat 10 times. Do 1-2 sessions per day. Activity: Use this motion to brush crumbs off the table.  Copyright  VHI. All rights reserved.

## 2020-09-04 NOTE — Therapy (Addendum)
[place], [place], [address] Phone: [phone]   Fax:  [phone]  Occupational Therapy Evaluation  Patient Details  Name: Shane Chambers MRN: [id number] Date of Birth: [birth year] Referring Provider (OT): [name], MD   Encounter Date: 09/04/2020   OT End of Session - 09/04/20 3734    Visit Number 1    Number of Visits 12    Date for OT Re-Evaluation 10/16/20    Authorization Type [place] Medicaid [employer]    Authorization Time Period 12 visits per therapy type approved until 09/10/20 for in-network and out of network providers. After 11/1, out of network providers need to send request for authorization prior to visits. ([facility] is out of network) *submitting request for visits on 10/27 to start 11/3           OT Start Time 0951    OT Stop Time 1032    OT Time Calculation (min) 41 min    Activity Tolerance Patient tolerated treatment well    Behavior During Therapy [facility] for tasks assessed/performed           Past Medical History:  Diagnosis Date  . CAD (coronary artery disease)    a. s/p CABG in 03/2020 with LIMA-LAD, RIMA-PL, RA-D1-RI-OM1  . Depression   . Diabetes mellitus without complication ()   . Hyperlipidemia 12/01/2019  . Hypertension   . Ischemic cardiomyopathy    a. EF 20-25% by echo in 02/2020 b. at 40% by echo in 03/2020 c. EF normalized to 60-65% by echo in 04/2020  . Type 2 diabetes mellitus ([place])     Past Surgical History:  Procedure Laterality Date  . CORONARY ARTERY BYPASS GRAFT N/A 03/13/2020   Procedure: CORONARY ARTERY BYPASS GRAFTING (CABG) times five using bilateral Internal mammary arteries and left radial artery;  Surgeon: [name], MD;  Location: [place];  Service: Open Heart Surgery;  Laterality: N/A;  . DENTAL SURGERY    . RADIAL ARTERY HARVEST Left 03/13/2020   Procedure: RADIAL ARTERY HARVEST,;  Surgeon: [name], MD;  Location: [place];  Service:  Open Heart Surgery;  Laterality: Left;  . RIGHT/LEFT HEART CATH AND CORONARY ANGIOGRAPHY N/A 03/07/2020   Procedure: RIGHT/LEFT HEART CATH AND CORONARY ANGIOGRAPHY;  Surgeon: Sherren Mocha, MD;  Location: [place] [facility];  Service: Cardiovascular;  Laterality: N/A;  . TEE WITHOUT CARDIOVERSION N/A 03/13/2020   Procedure: TRANSESOPHAGEAL ECHOCARDIOGRAM (TEE);  Surgeon: [name], MD;  Location: [place];  Service: Open Heart Surgery;  Laterality: N/A;    There were no vitals filed for this visit.   Subjective Assessment - 09/04/20 0955    Subjective  S: I've been in and out of the hospital and I honestly don't feel myself today    Pertinent History Patient is a 59 y/o male s/p BUE weakness, who is referred by Dr. Woody Seller for OT evaluation and treatment. 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 Patient reports that the weakness has been an ongoing issue but has gotten worse since his hopsital stays. He does not report any previous therapy for the weakness in his arms, but has and still is attending PT at [facility] outpatient for BLE weakness.    Special Tests no FOTO    Patient Stated Goals Be able to do more things for myself    Currently in  Pain? Yes    Pain Score 5     Pain Location Shoulder   Right hurts worse, but BUE pain   Pain Orientation Right;Left    Pain Descriptors / Indicators Aching    Pain Type Chronic pain    Pain Radiating Towards n/a    Pain Onset More than a month ago    Pain Frequency Constant    Aggravating Factors  any kind of movement    Pain Relieving Factors pain meds    Effect of Pain on Daily Activities mod to max    Multiple Pain Sites No             [facility] OT Assessment - 09/04/20 1002      Assessment   Medical Diagnosis BUE weakness    Referring Provider (OT) [name], MD    Onset Date/Surgical Date --   Pt reports onset of  a couple of years ago   Hand Dominance Right    Next MD Visit none scheduled    Prior Therapy none reported      Precautions   Precautions Fall    Required Braces or Orthoses --   Uses wheelchair for functional mobility     Restrictions   Weight Bearing Restrictions No      Balance Screen   Has the patient fallen in the past 6 months Yes    How many times? 12    Has the patient had a decrease in activity level because of a fear of falling?  Yes    Is the patient reluctant to leave their home because of a fear of falling?  No      Home  Environment   Family/patient expects to be discharged to: Private residence    Additional Comments Reports having personal wheelchair at home    Lives With Other (Comment)   Mom, sister and niece     Prior Function   Level of Independence Needs assistance with ADLs;Needs assistance with homemaking;Needs assistance with transfers;Needs assistance with gait    Vocation Retired      [employer] Used --    ADL comments Patient reports difficulty with basic ADLs such as dressing like putting on a shirt, buttoning      Cognition   Overall Cognitive Status Within Functional Limits for tasks assessed      Coordination   9 Hole Peg Test Right;Left    Right 9 Hole Peg Test 2'14"    Left 9 Hole Peg Test 1'23"      ROM / Strength   AROM / PROM / Strength AROM;PROM;Strength      Palpation   Palpation comment Max fascial restrictions palpated on BUE trapezius and scapularis regions      AROM   Overall AROM  Deficits    Overall AROM Comments Assessed seated, IR/er adducted    AROM Assessment Site Shoulder;Elbow;Forearm;Wrist    Right/Left Shoulder Right;Left    Right Shoulder Flexion 132 Degrees    Right Shoulder ABduction 130 Degrees    Right Shoulder Internal Rotation 90 Degrees    Right Shoulder External Rotation 42 Degrees    Left Shoulder Flexion 130 Degrees    Left Shoulder ABduction 105 Degrees    Left Shoulder Internal Rotation 90  Degrees    Left Shoulder External Rotation 30 Degrees    Right/Left Elbow Right;Left    Right Elbow Flexion --   WFL   Right Elbow Extension --   [facility]  Left Elbow Flexion --   WFL   Left Elbow Extension --   WFL   Right/Left Forearm Right;Left    Right Forearm Pronation --   WFL   Right Forearm Supination --   WFL   Left Forearm Pronation --   WFL   Left Forearm Supination --   WFL   Right/Left Wrist --      Strength   Overall Strength Deficits    Overall Strength Comments Assessed seated, IR/er adducted    Strength Assessment Site Shoulder;Elbow    Right/Left Shoulder Right;Left    Right Shoulder Flexion 4-/5    Right Shoulder ABduction 4-/5    Right Shoulder Internal Rotation 4/5    Right Shoulder External Rotation 4-/5    Right Shoulder Horizontal ABduction 4-/5    Right Shoulder Horizontal ADduction 4-/5    Left Shoulder Flexion 4-/5    Left Shoulder ABduction 4-/5    Left Shoulder Internal Rotation 4/5    Left Shoulder External Rotation 4-/5    Left Shoulder Horizontal ABduction 4/5    Left Shoulder Horizontal ADduction 4-/5    Right/Left Elbow Right;Left    Right Elbow Flexion 4-/5    Right Elbow Extension 4-/5    Left Elbow Flexion 4-/5    Left Elbow Extension 4-/5      Hand Function   Right Hand Grip (lbs) 20    Right Hand Lateral Pinch 8 lbs    Right Hand 3 Point Pinch 0 lbs   unable to coordinate the thumb for pinch to occur   Left Hand Grip (lbs) 50    Left Hand Lateral Pinch 8 lbs    Left 3 point pinch 6 lbs                           OT Education - 09/04/20 1316    Education Details Tables slides    Person(s) Educated Patient    Methods Explanation;Handout;Demonstration;Verbal cues    Comprehension Verbalized understanding;Returned demonstration;Verbal cues required            OT Short Term Goals - 09/04/20 1352      OT SHORT TERM GOAL #1   Title Patient will be educated and independent in HEP in order to improve BUE functional  use needed for basic self-care and desired daily tasks.    Time 3    Period Weeks    Status New    Target Date 09/25/20      OT SHORT TERM GOAL #2   Title Patient will report using BUE in approximately 75% of desired tasks in order to promote increased independence.    Time 3    Period Weeks    Status New             OT Long Term Goals - 09/04/20 1403      OT LONG TERM GOAL #1   Title Patient will decrease fascial restrictions to at least a mod amount in order to improve mobility within a pain free zone.    Time 6    Period Weeks    Status New    Target Date 10/16/20      OT LONG TERM GOAL #2   Title Patient will improve BUE strength to at least a 4/5 in order to promote patient's efficiency with holding light weight items needed during basic self-care task completion.    Time 6    Period Weeks    Status  New      OT LONG TERM GOAL #3   Title Patient will improve [name] grip strength by at least 6# in order to improve functional use of [name] as dominant in holding and gripping items during dressing and other self-care tasks.    Time 6    Period Weeks    Status New      OT LONG TERM GOAL #4   Title Patient will improve BUE coordination by completing the nine hole peg test 20" faster in each hand to promote more efficiency with manipulating buttons and zippers during dressing tasks.    Time 6    Period Weeks    Status New      OT LONG TERM GOAL #5   Title --    Time --    Period --    Status --                 Plan - 09/04/20 1330    Clinical Impression Statement A: Patient is a 59 y/o male who presents with BUE weakness, causing increased pain, decreased strength and coordination which affects his ability to complete basic self-care and other daily tasks efficiently and without pain.    OT Occupational Profile and History Problem Focused Assessment - Including review of records relating to presenting problem    Occupational performance deficits (Please refer to  evaluation for details): ADL's;IADL's;Rest and Sleep;Leisure    Body Structure / Function / Physical Skills ADL;Endurance;Muscle spasms;UE functional use;Fascial restriction;Body mechanics;Pain;Coordination;IADL;Mobility;Strength    Rehab Potential Fair    Clinical Decision Making Several treatment options, min-mod task modification necessary    Comorbidities Affecting Occupational Performance: Presence of comorbidities impacting occupational performance    Comorbidities impacting occupational performance description: CHF, COPD, Diabetes, wheelchair bound    Modification or Assistance to Complete Evaluation  Min-Moderate modification of tasks or assist with assess necessary to complete eval    OT Frequency 2x / week    OT Duration 6 weeks    OT Treatment/Interventions Self-care/ADL training;Ultrasound;Energy conservation;DME and/or AE instruction;Patient/family education;Passive range of motion;Cryotherapy;Electrical Stimulation;Moist Heat;Therapeutic exercise;Manual Therapy;Therapeutic activities;Traction;Other (comment)    Plan P: Patient will benefit from skilled OT intervention to address the following deficits: increased fascial restrictions, decreased grip/pinch strength and coordination. Treatment plan: manual techniques, scapular strengthening, grip and pinch strengthening, coordination, and modification techniques for self-care tasks.    OT Home Exercise Plan Eval 10/26: Table slides    Consulted and Agree with Plan of Care Patient           Patient will benefit from skilled therapeutic intervention in order to improve the following deficits and impairments:   Body Structure / Function / Physical Skills: ADL, Endurance, Muscle spasms, UE functional use, Fascial restriction, Body mechanics, Pain, Coordination, IADL, Mobility, Strength       Visit Diagnosis: Chronic right shoulder pain  Chronic left shoulder pain  Other symptoms and signs involving the musculoskeletal  system  Other lack of coordination  Stiffness of left shoulder, not elsewhere classified  Stiffness of right shoulder, not elsewhere classified    Problem List Patient Active Problem List   Diagnosis Date Noted  . CHF exacerbation ([place]) 08/01/2020  . Visit for wound check 05/28/2020  . OSA (obstructive sleep apnea) 05/24/2020  . Acute exacerbation of chronic obstructive pulmonary disease (COPD) ([place]) 04/29/2020  . Leukocytosis 04/29/2020  . 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 Ischemic cardiomyopathy   . Coronary artery disease   . Acute on chronic combined systolic  and diastolic CHF (congestive heart failure) ([place]) 03/05/2020  . Acute hyponatremia 03/05/2020  . Severe Vitamin D deficiency 12/02/2019  . Hypokalemia 12/01/2019  . Hyperlipidemia 12/01/2019  . BPH (benign prostatic hyperplasia) 12/01/2019  . Normocytic anemia 12/01/2019  . Pneumonia due to COVID-19 virus 12/01/2019  . Hypoxia   . SOB (shortness of breath) 11/16/2019  . Hypertension   . Type 2 diabetes mellitus ([place])   . Depression   . Acute bronchitis with bronchospasm 11/15/2019     [place], [place] Student [phone]   09/04/2020, 5:21 PM  [place], [place], [address] Phone: [phone]   Fax:  [phone]  Name: BIAGIO SNELSON MRN: [id number] Date of Birth: 28-Jun-1961

## 2020-09-05 ENCOUNTER — Other Ambulatory Visit: Payer: Self-pay

## 2020-09-05 ENCOUNTER — Encounter (HOSPITAL_COMMUNITY): Payer: Self-pay

## 2020-09-05 ENCOUNTER — Ambulatory Visit (HOSPITAL_COMMUNITY): Payer: Medicaid Other

## 2020-09-05 ENCOUNTER — Encounter (HOSPITAL_COMMUNITY): Payer: Medicaid Other | Admitting: Occupational Therapy

## 2020-09-05 DIAGNOSIS — M6281 Muscle weakness (generalized): Secondary | ICD-10-CM | POA: Diagnosis not present

## 2020-09-05 DIAGNOSIS — R262 Difficulty in walking, not elsewhere classified: Secondary | ICD-10-CM

## 2020-09-05 DIAGNOSIS — R2689 Other abnormalities of gait and mobility: Secondary | ICD-10-CM

## 2020-09-05 NOTE — Therapy (Signed)
[facility] [address] [place], [place], [address] Phone: [phone]   Fax:  [phone]  Physical Therapy Treatment  Patient Details  Name: Shane Chambers MRN: [id number] Date of Birth: 15-Oct-1961 Referring Provider (PT): Pratik Manuella Ghazi DO   Encounter Date: 09/05/2020   PT End of Session - 09/05/20 1128    Visit Number 6    Number of Visits 12    Date for PT Re-Evaluation 09/25/20    Authorization Type [employer] medicaid [employer] (auth required)    Authorization Time Period First 20 visits approved then required authorization request for additional treatments    PT Start Time 1100    PT Stop Time 1153    PT Time Calculation (min) 53 min    Equipment Utilized During Treatment Gait belt    Activity Tolerance Patient tolerated treatment well;Patient limited by pain;Patient limited by fatigue    Behavior During Therapy [facility] for tasks assessed/performed           Past Medical History:  Diagnosis Date  . CAD (coronary artery disease)    a. s/p CABG in 03/2020 with LIMA-LAD, RIMA-PL, RA-D1-RI-OM1  . Depression   . Diabetes mellitus without complication ([place])   . Hyperlipidemia 12/01/2019  . Hypertension   . Ischemic cardiomyopathy    a. EF 20-25% by echo in 02/2020 b. at 40% by echo in 03/2020 c. EF normalized to 60-65% by echo in 04/2020  . Type 2 diabetes mellitus ([place])     Past Surgical History:  Procedure Laterality Date  . CORONARY ARTERY BYPASS GRAFT N/A 03/13/2020   Procedure: CORONARY ARTERY BYPASS GRAFTING (CABG) times five using bilateral Internal mammary arteries and left radial artery;  Surgeon: [name], MD;  Location: [place];  Service: Open Heart Surgery;  Laterality: N/A;  . DENTAL SURGERY    . RADIAL ARTERY HARVEST Left 03/13/2020   Procedure: RADIAL ARTERY HARVEST,;  Surgeon: [name], MD;  Location: [place];  Service: Open Heart Surgery;  Laterality: Left;  . RIGHT/LEFT HEART CATH AND CORONARY ANGIOGRAPHY  N/A 03/07/2020   Procedure: RIGHT/LEFT HEART CATH AND CORONARY ANGIOGRAPHY;  Surgeon: Sherren Mocha, MD;  Location: [place] [facility];  Service: Cardiovascular;  Laterality: N/A;  . TEE WITHOUT CARDIOVERSION N/A 03/13/2020   Procedure: TRANSESOPHAGEAL ECHOCARDIOGRAM (TEE);  Surgeon: [name], MD;  Location: [facility];  Service: Open Heart Surgery;  Laterality: N/A;    There were no vitals filed for this visit.   Subjective Assessment - 09/05/20 1129    Subjective Pt reports 3 falls in the last week, 2 of them due to walker stopping but he didn't.  Reports he crawls to chair to get up.  Currently has pain BLE 6/10, took pain meds earlier today at 930.    Pertinent History falls, quintuple bybass    Patient Stated Goals to gain back some function    Currently in Pain? Yes    Pain Score 6     Pain Location Leg    Pain Orientation Right;Left    Pain Descriptors / Indicators Aching;Sore    Pain Type Chronic pain    Pain Onset More than a month ago    Pain Frequency Constant    Aggravating Factors  any kind of movement    Pain Relieving Factors pain meds    Effect of Pain on Daily Activities mod to max  [facility] Adult PT Treatment/Exercise - 09/05/20 0001      Transfers   Transfers Sit to Stand;Stand Pivot Transfers    Sit to Stand 4: Min assist    Sit to Stand Details (indicate cue type and reason) with RW    Stand Pivot Transfers 4: Min guard;5: Supervision    Stand Pivot Transfer Details (indicate cue type and reason) WC to car, cueing for hand placement    Number of Reps Other reps (comment)    Transfer Cueing 3 STS then standing tolerance      Ambulation/Gait   Ambulation/Gait Yes    Ambulation/Gait Assistance 4: Min guard;4: Min assist    Ambulation Distance (Feet) 120 Feet   3 sets 75ft; 28; 46 ft prior need to rest per fatigue   Assistive device Rolling walker    Gait Pattern Decreased step length - right;Decreased step length  - left;Decreased hip/knee flexion - right;Decreased hip/knee flexion - left;Right foot flat;Left foot flat;Poor foot clearance - left;Poor foot clearance - right;Wide base of support;Trunk flexed    Ambulation Surface Level;Indoor    Gait velocity decreased    Gait Comments limited by fatigue       Knee/Hip Exercises: Aerobic   Nustep 3' L1 UE/LE SPM 43 average      Knee/Hip Exercises: Standing   Other Standing Knee Exercises Standing tolerance 3x 1'18"; 58";  prior need to sit      Knee/Hip Exercises: Seated   Sit to Sand --   3 reps with UE support; worked on standing tolerance w/ STS                   PT Short Term Goals - 09/03/20 1105      PT SHORT TERM GOAL #1   Title Patient will be independent with HEP in order to improve functional outcomes.    Time 3    Period Weeks    Status On-going    Target Date 09/04/20      PT SHORT TERM GOAL #2   Title Patient will report at least 25% improvement in symptoms for improved quality of life.    Time 3    Period Weeks    Status On-going    Target Date 09/04/20             PT Long Term Goals - 09/03/20 1106      PT LONG TERM GOAL #1   Title Patient will report at least 75% improvement in symptoms for improved quality of life.    Time 6    Period Weeks    Status On-going      PT LONG TERM GOAL #2   Title Patient will be able to complete TUG in less than 1:20 in order to show improved ambulation ability.    Time 6    Period Weeks    Status On-going      PT LONG TERM GOAL #3   Title Patient will be able to ambulate at least 200 feet for improved ambulation for appointments.    Time 6    Period Weeks    Status On-going                 Plan - 09/05/20 1354    Clinical Impression Statement Continued session focucs with activity tolerance to improve standing and gait.  Timed standing tolerance today with longest duration prior fatigue at 1'18".  Pt able to demonstrate safe mechanics with proper hand  placement for STS  90% of the time, did require cueing with transfer from [facility] back to chair.  Able to ambualte 120 ft over 3 sets today with seated rest breaks required, cueing to stand tall within walker and slow cadence.  Pt did required inhalers folloiwng gait training due to c/o SOB.  EOS added nustep to improve activity tolerance, pt able to go for 3 minutes with average SPM at 43.    Personal Factors and Comorbidities Comorbidity 3+;Fitness;Behavior Pattern;Past/Current Experience;Social Background;Time since onset of injury/illness/exacerbation;Transportation    Comorbidities neuropathy, CABG, frequent falls, sedentary lifestyle    Examination-Activity Limitations Bathing;Bed Mobility;Caring for Others;Carry;Dressing;Hygiene/Grooming;Lift;Stand;Stairs;Squat;Sit;Locomotion Level;Transfers    Examination-Participation Restrictions Meal Prep;Cleaning;Occupation;Community Activity;Interpersonal Relationship;Laundry;Yard Work;Volunteer;Shop;Driving    Stability/Clinical Decision Making Evolving/Moderate complexity    Clinical Decision Making Moderate    Rehab Potential Fair    PT Frequency 2x / week    PT Duration 6 weeks    PT Treatment/Interventions ADLs/Self Care Home Management;Aquatic Therapy;DME Instruction;Cryotherapy;Electrical Stimulation;Iontophoresis 4mg /ml Dexamethasone;Moist Heat;Traction;Ultrasound;Therapeutic exercise;Therapeutic activities;Functional mobility training;Stair training;Gait training;Neuromuscular re-education;Patient/family education;Manual techniques;Orthotic Fit/Training;Dry needling;Wheelchair mobility training;Passive range of motion;Energy conservation;Splinting;Taping;Spinal Manipulations;Joint Manipulations    PT Next Visit Plan begin LE strengthing, and gait and balance training as tolerated    PT Home Exercise Plan 10/5 LAQ, marches, ankle pumps; 10/15: standing tolerance front of sink           Patient will benefit from skilled therapeutic intervention in  order to improve the following deficits and impairments:  Abnormal gait, Difficulty walking, Cardiopulmonary status limiting activity, Decreased endurance, Impaired UE functional use, Decreased activity tolerance, Decreased balance, Decreased mobility, Decreased strength, Improper body mechanics, Impaired perceived functional ability, Decreased knowledge of use of DME, Pain, Obesity  Visit Diagnosis: Other abnormalities of gait and mobility  Difficulty in walking, not elsewhere classified  Muscle weakness (generalized)     Problem List Patient Active Problem List   Diagnosis Date Noted  . CHF exacerbation ([place]) 08/01/2020  . Visit for wound check 05/28/2020  . OSA (obstructive sleep apnea) 05/24/2020  . Acute exacerbation of chronic obstructive pulmonary disease (COPD) ([place]) 04/29/2020  . Leukocytosis 04/29/2020  . 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 Ischemic cardiomyopathy   . Coronary artery disease   . Acute on chronic combined systolic and diastolic CHF (congestive heart failure) ([place]) 03/05/2020  . Acute hyponatremia 03/05/2020  . Severe Vitamin D deficiency 12/02/2019  . Hypokalemia 12/01/2019  . Hyperlipidemia 12/01/2019  . BPH (benign prostatic hyperplasia) 12/01/2019  . Normocytic anemia 12/01/2019  . Pneumonia due to COVID-19 virus 12/01/2019  . Hypoxia   . SOB (shortness of breath) 11/16/2019  . Hypertension   . Type 2 diabetes mellitus ([place])   . Depression   . Acute bronchitis with bronchospasm 11/15/2019   Ihor Austin, LPTA/CLT; CBIS [phone]  Aldona Lento 09/05/2020, 2:01 PM  [facility] [address] [place], [place], [address] Phone: [phone]   Fax:  [phone]  Name: MAXON KRESSE MRN: [id number] Date of Birth: 02-Apr-1961

## 2020-09-10 ENCOUNTER — Ambulatory Visit (HOSPITAL_COMMUNITY): Payer: Medicaid Other | Attending: Internal Medicine | Admitting: Physical Therapy

## 2020-09-10 ENCOUNTER — Other Ambulatory Visit: Payer: Self-pay

## 2020-09-10 ENCOUNTER — Ambulatory Visit: Payer: Medicaid Other | Attending: Pulmonary Disease | Admitting: Pulmonary Disease

## 2020-09-10 DIAGNOSIS — R278 Other lack of coordination: Secondary | ICD-10-CM | POA: Insufficient documentation

## 2020-09-10 DIAGNOSIS — R262 Difficulty in walking, not elsewhere classified: Secondary | ICD-10-CM | POA: Diagnosis present

## 2020-09-10 DIAGNOSIS — G4733 Obstructive sleep apnea (adult) (pediatric): Secondary | ICD-10-CM

## 2020-09-10 DIAGNOSIS — J1282 Pneumonia due to coronavirus disease 2019: Secondary | ICD-10-CM

## 2020-09-10 DIAGNOSIS — G8929 Other chronic pain: Secondary | ICD-10-CM | POA: Diagnosis present

## 2020-09-10 DIAGNOSIS — M25611 Stiffness of right shoulder, not elsewhere classified: Secondary | ICD-10-CM | POA: Insufficient documentation

## 2020-09-10 DIAGNOSIS — M25512 Pain in left shoulder: Secondary | ICD-10-CM | POA: Diagnosis present

## 2020-09-10 DIAGNOSIS — U071 COVID-19: Secondary | ICD-10-CM | POA: Insufficient documentation

## 2020-09-10 DIAGNOSIS — R29898 Other symptoms and signs involving the musculoskeletal system: Secondary | ICD-10-CM | POA: Insufficient documentation

## 2020-09-10 DIAGNOSIS — M6281 Muscle weakness (generalized): Secondary | ICD-10-CM | POA: Insufficient documentation

## 2020-09-10 DIAGNOSIS — J441 Chronic obstructive pulmonary disease with (acute) exacerbation: Secondary | ICD-10-CM | POA: Diagnosis not present

## 2020-09-10 DIAGNOSIS — M25612 Stiffness of left shoulder, not elsewhere classified: Secondary | ICD-10-CM | POA: Diagnosis present

## 2020-09-10 DIAGNOSIS — R2689 Other abnormalities of gait and mobility: Secondary | ICD-10-CM | POA: Diagnosis present

## 2020-09-10 DIAGNOSIS — J9601 Acute respiratory failure with hypoxia: Secondary | ICD-10-CM | POA: Diagnosis not present

## 2020-09-10 DIAGNOSIS — M25511 Pain in right shoulder: Secondary | ICD-10-CM | POA: Diagnosis present

## 2020-09-10 NOTE — Therapy (Signed)
[facility] [address] [place], [place], [address] Phone: [phone]   Fax:  [phone]  Physical Therapy Treatment  Patient Details  Name: Shane Chambers MRN: [id number] Date of Birth: 17-Nov-1960 Referring Provider (PT): Pratik Manuella Ghazi DO   Encounter Date: 09/10/2020   PT End of Session - 09/10/20 1221    Visit Number 7    Number of Visits 12    Date for PT Re-Evaluation 09/25/20    Authorization Type [place] medicaid [employer] (auth required)    Authorization Time Period First 20 visits approved then required authorization request for additional treatments    PT Start Time 1136    PT Stop Time 1225    PT Time Calculation (min) 49 min    Equipment Utilized During Treatment Gait belt    Activity Tolerance Patient tolerated treatment well;Patient limited by pain;Patient limited by fatigue    Behavior During Therapy [facility] for tasks assessed/performed           Past Medical History:  Diagnosis Date  . CAD (coronary artery disease)    a. s/p CABG in 03/2020 with LIMA-LAD, RIMA-PL, RA-D1-RI-OM1  . Depression   . Diabetes mellitus without complication ([place])   . Hyperlipidemia 12/01/2019  . Hypertension   . Ischemic cardiomyopathy    a. EF 20-25% by echo in 02/2020 b. at 40% by echo in 03/2020 c. EF normalized to 60-65% by echo in 04/2020  . Type 2 diabetes mellitus ([place])     Past Surgical History:  Procedure Laterality Date  . CORONARY ARTERY BYPASS GRAFT N/A 03/13/2020   Procedure: CORONARY ARTERY BYPASS GRAFTING (CABG) times five using bilateral Internal mammary arteries and left radial artery;  Surgeon: [name], MD;  Location: [facility];  Service: Open Heart Surgery;  Laterality: N/A;  . DENTAL SURGERY    . RADIAL ARTERY HARVEST Left 03/13/2020   Procedure: RADIAL ARTERY HARVEST,;  Surgeon: [name], MD;  Location: [facility];  Service: Open Heart Surgery;  Laterality: Left;  . RIGHT/LEFT HEART CATH AND CORONARY ANGIOGRAPHY  N/A 03/07/2020   Procedure: RIGHT/LEFT HEART CATH AND CORONARY ANGIOGRAPHY;  Surgeon: Sherren Mocha, MD;  Location: [facility];  Service: Cardiovascular;  Laterality: N/A;  . TEE WITHOUT CARDIOVERSION N/A 03/13/2020   Procedure: TRANSESOPHAGEAL ECHOCARDIOGRAM (TEE);  Surgeon: [name], MD;  Location: [facility];  Service: Open Heart Surgery;  Laterality: N/A;    There were no vitals filed for this visit.   Subjective Assessment - 09/10/20 1219    Subjective Pt states his legs are hurting today.  STates he had a few close calls over the weekend but didnt' fall.  Currently 8/10 pain in legs.    Currently in Pain? Yes    Pain Score 8     Pain Location Leg    Pain Orientation Right;Left    Pain Descriptors / Indicators Aching                             [facility] Adult PT Treatment/Exercise - 09/10/20 0001      Transfers   Transfers Sit to Stand;Stand Pivot Transfers    Sit to Stand 4: Min assist    Stand Pivot Transfers 4: Min guard;5: Supervision    Number of Reps Other reps (comment)    Transfer Cueing 2 STS with stand tolerance 1'10" and 2'10"      Ambulation/Gait   Ambulation/Gait Yes  Ambulation/Gait Assistance 4: Min guard;4: Min Engineer, drilling (Feet) 45 Feet   5', 20', 10',10'   Assistive device Rolling walker    Gait Pattern Decreased step length - right;Decreased step length - left;Decreased hip/knee flexion - right;Decreased hip/knee flexion - left;Right foot flat;Left foot flat;Poor foot clearance - left;Poor foot clearance - right;Wide base of support;Trunk flexed    Ambulation Surface Level;Indoor    Gait velocity decreased    Gait Comments limited by fatigue       Knee/Hip Exercises: Aerobic   Nustep 5' L1 UE/LE SPM 43 average                    PT Short Term Goals - 09/03/20 1105      PT SHORT TERM GOAL #1   Title Patient will be independent with HEP in order to improve functional outcomes.    Time 3     Period Weeks    Status On-going    Target Date 09/04/20      PT SHORT TERM GOAL #2   Title Patient will report at least 25% improvement in symptoms for improved quality of life.    Time 3    Period Weeks    Status On-going    Target Date 09/04/20             PT Long Term Goals - 09/03/20 1106      PT LONG TERM GOAL #1   Title Patient will report at least 75% improvement in symptoms for improved quality of life.    Time 6    Period Weeks    Status On-going      PT LONG TERM GOAL #2   Title Patient will be able to complete TUG in less than 1:20 in order to show improved ambulation ability.    Time 6    Period Weeks    Status On-going      PT LONG TERM GOAL #3   Title Patient will be able to ambulate at least 200 feet for improved ambulation for appointments.    Time 6    Period Weeks    Status On-going                 Plan - 09/10/20 1230    Clinical Impression Statement PT with increased pain in LE's today affecting his functional mobility and performance with therapy today.  Only able to ambulate max of 20 feet before having to rest.   4 bouts of 5', 20', 10', 10' today with rest between.  Max tolerance of standing increased to 2'10".  Finished up with nustep again for activity tolerance.  Pt with noted fatigue today and LE pain despite taking pain meds.    Personal Factors and Comorbidities Comorbidity 3+;Fitness;Behavior Pattern;Past/Current Experience;Social Background;Time since onset of injury/illness/exacerbation;Transportation    Comorbidities neuropathy, CABG, frequent falls, sedentary lifestyle    Examination-Activity Limitations Bathing;Bed Mobility;Caring for Others;Carry;Dressing;Hygiene/Grooming;Lift;Stand;Stairs;Squat;Sit;Locomotion Level;Transfers    Examination-Participation Restrictions Meal Prep;Cleaning;Occupation;Community Activity;Interpersonal Relationship;Laundry;Yard Work;Volunteer;Shop;Driving    Stability/Clinical Decision Making  Evolving/Moderate complexity    Rehab Potential Fair    PT Frequency 2x / week    PT Duration 6 weeks    PT Treatment/Interventions ADLs/Self Care Home Management;Aquatic Therapy;DME Instruction;Cryotherapy;Electrical Stimulation;Iontophoresis 4mg /ml Dexamethasone;Moist Heat;Traction;Ultrasound;Therapeutic exercise;Therapeutic activities;Functional mobility training;Stair training;Gait training;Neuromuscular re-education;Patient/family education;Manual techniques;Orthotic Fit/Training;Dry needling;Wheelchair mobility training;Passive range of motion;Energy conservation;Splinting;Taping;Spinal Manipulations;Joint Manipulations    PT Next Visit Plan Progres LE strengthing, and gait and balance training as tolerated  PT Home Exercise Plan 10/5 LAQ, marches, ankle pumps; 10/15: standing tolerance front of sink           Patient will benefit from skilled therapeutic intervention in order to improve the following deficits and impairments:  Abnormal gait, Difficulty walking, Cardiopulmonary status limiting activity, Decreased endurance, Impaired UE functional use, Decreased activity tolerance, Decreased balance, Decreased mobility, Decreased strength, Improper body mechanics, Impaired perceived functional ability, Decreased knowledge of use of DME, Pain, Obesity  Visit Diagnosis: Other abnormalities of gait and mobility  Difficulty in walking, not elsewhere classified  Muscle weakness (generalized)     Problem List Patient Active Problem List   Diagnosis Date Noted  . CHF exacerbation ([place]) 08/01/2020  . Visit for wound check 05/28/2020  . OSA (obstructive sleep apnea) 05/24/2020  . Acute exacerbation of chronic obstructive pulmonary disease (COPD) ([place]) 04/29/2020  . Leukocytosis 04/29/2020  . 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 Ischemic cardiomyopathy   . Coronary artery disease   . Acute on chronic combined systolic and diastolic CHF (congestive heart failure) ([place]) 03/05/2020  . Acute  hyponatremia 03/05/2020  . Severe Vitamin D deficiency 12/02/2019  . Hypokalemia 12/01/2019  . Hyperlipidemia 12/01/2019  . BPH (benign prostatic hyperplasia) 12/01/2019  . Normocytic anemia 12/01/2019  . Pneumonia due to COVID-19 virus 12/01/2019  . Hypoxia   . SOB (shortness of breath) 11/16/2019  . Hypertension   . Type 2 diabetes mellitus ([place])   . Depression   . Acute bronchitis with bronchospasm 11/15/2019   Teena Irani, PTA/CLT [phone]  Teena Irani 09/10/2020, 12:33 PM  [facility] [address] [place], [place], [address] Phone: [phone]   Fax:  [phone]  Name: Shane Chambers MRN: [id number] Date of Birth: December 13, 1960

## 2020-09-12 ENCOUNTER — Encounter (HOSPITAL_COMMUNITY): Payer: Self-pay

## 2020-09-12 ENCOUNTER — Ambulatory Visit (HOSPITAL_COMMUNITY): Payer: Medicaid Other | Admitting: Physical Therapy

## 2020-09-12 ENCOUNTER — Other Ambulatory Visit: Payer: Self-pay

## 2020-09-12 ENCOUNTER — Ambulatory Visit (HOSPITAL_COMMUNITY): Payer: Medicaid Other

## 2020-09-12 ENCOUNTER — Encounter (HOSPITAL_COMMUNITY): Payer: Self-pay | Admitting: Physical Therapy

## 2020-09-12 DIAGNOSIS — M25611 Stiffness of right shoulder, not elsewhere classified: Secondary | ICD-10-CM

## 2020-09-12 DIAGNOSIS — R29898 Other symptoms and signs involving the musculoskeletal system: Secondary | ICD-10-CM

## 2020-09-12 DIAGNOSIS — R2689 Other abnormalities of gait and mobility: Secondary | ICD-10-CM

## 2020-09-12 DIAGNOSIS — G8929 Other chronic pain: Secondary | ICD-10-CM

## 2020-09-12 DIAGNOSIS — R262 Difficulty in walking, not elsewhere classified: Secondary | ICD-10-CM

## 2020-09-12 DIAGNOSIS — R278 Other lack of coordination: Secondary | ICD-10-CM

## 2020-09-12 DIAGNOSIS — M6281 Muscle weakness (generalized): Secondary | ICD-10-CM

## 2020-09-12 DIAGNOSIS — M25612 Stiffness of left shoulder, not elsewhere classified: Secondary | ICD-10-CM

## 2020-09-12 NOTE — Therapy (Signed)
[facility] [address] [place], [place], [address] Phone: [phone]   Fax:  [phone]  Physical Therapy Treatment  Patient Details  Name: Shane Chambers MRN: [id number] Date of Birth: Dec 12, 1960 Referring Provider (PT): Pratik Manuella Ghazi DO   Encounter Date: 09/12/2020   PT End of Session - 09/12/20 1314    Visit Number 8    Number of Visits 12    Date for PT Re-Evaluation 09/25/20    Authorization Type [place] medicaid [employer] (auth required)    Authorization Time Period First 20 visits approved then required authorization request for additional treatments    PT Start Time 1315    PT Stop Time 1355    PT Time Calculation (min) 40 min    Equipment Utilized During Treatment Gait belt    Activity Tolerance Patient tolerated treatment well;Patient limited by pain;Patient limited by fatigue    Behavior During Therapy [facility] for tasks assessed/performed           Past Medical History:  Diagnosis Date  . CAD (coronary artery disease)    a. s/p CABG in 03/2020 with LIMA-LAD, RIMA-PL, RA-D1-RI-OM1  . Depression   . Diabetes mellitus without complication ([place])   . Hyperlipidemia 12/01/2019  . Hypertension   . Ischemic cardiomyopathy    a. EF 20-25% by echo in 02/2020 b. at 40% by echo in 03/2020 c. EF normalized to 60-65% by echo in 04/2020  . Type 2 diabetes mellitus ([place])     Past Surgical History:  Procedure Laterality Date  . CORONARY ARTERY BYPASS GRAFT N/A 03/13/2020   Procedure: CORONARY ARTERY BYPASS GRAFTING (CABG) times five using bilateral Internal mammary arteries and left radial artery;  Surgeon: [name], MD;  Location: [facility];  Service: Open Heart Surgery;  Laterality: N/A;  . DENTAL SURGERY    . RADIAL ARTERY HARVEST Left 03/13/2020   Procedure: RADIAL ARTERY HARVEST,;  Surgeon: [name], MD;  Location: [facility];  Service: Open Heart Surgery;  Laterality: Left;  . RIGHT/LEFT HEART CATH AND CORONARY ANGIOGRAPHY  N/A 03/07/2020   Procedure: RIGHT/LEFT HEART CATH AND CORONARY ANGIOGRAPHY;  Surgeon: Sherren Mocha, MD;  Location: [facility];  Service: Cardiovascular;  Laterality: N/A;  . TEE WITHOUT CARDIOVERSION N/A 03/13/2020   Procedure: TRANSESOPHAGEAL ECHOCARDIOGRAM (TEE);  Surgeon: [name], MD;  Location: [facility];  Service: Open Heart Surgery;  Laterality: N/A;    There were no vitals filed for this visit.   Subjective Assessment - 09/12/20 1314    Subjective Patient states he has been sore and stiff and feeling weaker than normal. He has been having a lot of leg pain and back and butt pain. He does his exercises whenever he can. He does walk to the bathroom frequently.    Currently in Pain? Yes    Pain Score 7     Pain Location Leg    Pain Orientation Right;Left                             [facility] Adult PT Treatment/Exercise - 09/12/20 0001      Transfers   Transfers Sit to Stand;Stand Pivot Transfers    Sit to Stand 4: Min assist    Sit to Stand Details (indicate cue type and reason) with RW    Transfer Cueing 4 sit to stand with 4 1 minute bouts of standing      Knee/Hip Exercises:  Standing   Knee Flexion Both;5 reps    Knee Flexion Limitations marching alternating with HHA    Other Standing Knee Exercises Standing tolerance 4x 1' and sit to stand x 3      Knee/Hip Exercises: Seated   Other Seated Knee/Hip Exercises heel and toe raises 1x 20 bilateral     Marching Both;1 set;20 reps    Abduction/Adduction  5 reps    Abd/Adduction Limitations 10 sec holds isometrics with ball/belt at knees                  PT Education - 09/12/20 1313    Education Details HEP, Exercise mechanics, f/u with MD on symptoms    Person(s) Educated Patient    Methods Explanation;Demonstration    Comprehension Verbalized understanding;Returned demonstration            PT Short Term Goals - 09/03/20 1105      PT SHORT TERM GOAL #1   Title Patient will be  independent with HEP in order to improve functional outcomes.    Time 3    Period Weeks    Status On-going    Target Date 09/04/20      PT SHORT TERM GOAL #2   Title Patient will report at least 25% improvement in symptoms for improved quality of life.    Time 3    Period Weeks    Status On-going    Target Date 09/04/20             PT Long Term Goals - 09/03/20 1106      PT LONG TERM GOAL #1   Title Patient will report at least 75% improvement in symptoms for improved quality of life.    Time 6    Period Weeks    Status On-going      PT LONG TERM GOAL #2   Title Patient will be able to complete TUG in less than 1:20 in order to show improved ambulation ability.    Time 6    Period Weeks    Status On-going      PT LONG TERM GOAL #3   Title Patient will be able to ambulate at least 200 feet for improved ambulation for appointments.    Time 6    Period Weeks    Status On-going                 Plan - 09/12/20 1314    Clinical Impression Statement Patient shows very limited standing tolerance today and fatigues quickly with all standing. He has not been feeling well and is educated on f/u with MD regarding recent symptoms and weakness. Patient tolerates seated exercises well with minimal fatigue. Patient with fatigue level of 6/10 at beginning of session and remains at 6/10 with sitting exercises. Patient requires frequent rest breaks today secondary to fatigue. He requires min/mod assist and parallel bars for transfer to standing today and demonstrates fair standing tolerance. He is able to stand for 1 minute bouts and is limited by pain/fatigue. Patient will continue to benefit from skilled physical therapy in order to reduce impairment and improve function.    Personal Factors and Comorbidities Comorbidity 3+;Fitness;Behavior Pattern;Past/Current Experience;Social Background;Time since onset of injury/illness/exacerbation;Transportation    Comorbidities neuropathy,  CABG, frequent falls, sedentary lifestyle    Examination-Activity Limitations Bathing;Bed Mobility;Caring for Others;Carry;Dressing;Hygiene/Grooming;Lift;Stand;Stairs;Squat;Sit;Locomotion Level;Transfers    Examination-Participation Restrictions Meal Prep;Cleaning;Occupation;Community Activity;Interpersonal Relationship;Laundry;Yard Work;Volunteer;Shop;Driving    Stability/Clinical Decision Making Evolving/Moderate complexity    Rehab Potential Fair  PT Frequency 2x / week    PT Duration 6 weeks    PT Treatment/Interventions ADLs/Self Care Home Management;Aquatic Therapy;DME Instruction;Cryotherapy;Electrical Stimulation;Iontophoresis 4mg /ml Dexamethasone;Moist Heat;Traction;Ultrasound;Therapeutic exercise;Therapeutic activities;Functional mobility training;Stair training;Gait training;Neuromuscular re-education;Patient/family education;Manual techniques;Orthotic Fit/Training;Dry needling;Wheelchair mobility training;Passive range of motion;Energy conservation;Splinting;Taping;Spinal Manipulations;Joint Manipulations    PT Next Visit Plan Progres LE strengthing, and gait and balance training as tolerated    PT Home Exercise Plan 10/5 LAQ, marches, ankle pumps; 10/15: standing tolerance front of sink           Patient will benefit from skilled therapeutic intervention in order to improve the following deficits and impairments:  Abnormal gait, Difficulty walking, Cardiopulmonary status limiting activity, Decreased endurance, Impaired UE functional use, Decreased activity tolerance, Decreased balance, Decreased mobility, Decreased strength, Improper body mechanics, Impaired perceived functional ability, Decreased knowledge of use of DME, Pain, Obesity  Visit Diagnosis: Other abnormalities of gait and mobility  Difficulty in walking, not elsewhere classified  Muscle weakness (generalized)     Problem List Patient Active Problem List   Diagnosis Date Noted  . CHF exacerbation ([place])  08/01/2020  . Visit for wound check 05/28/2020  . OSA (obstructive sleep apnea) 05/24/2020  . Acute exacerbation of chronic obstructive pulmonary disease (COPD) ([place]) 04/29/2020  . Leukocytosis 04/29/2020  . 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 Ischemic cardiomyopathy   . Coronary artery disease   . Acute on chronic combined systolic and diastolic CHF (congestive heart failure) ([place]) 03/05/2020  . Acute hyponatremia 03/05/2020  . Severe Vitamin D deficiency 12/02/2019  . Hypokalemia 12/01/2019  . Hyperlipidemia 12/01/2019  . BPH (benign prostatic hyperplasia) 12/01/2019  . Normocytic anemia 12/01/2019  . Pneumonia due to COVID-19 virus 12/01/2019  . Hypoxia   . SOB (shortness of breath) 11/16/2019  . Hypertension   . Type 2 diabetes mellitus ([place])   . Depression   . Acute bronchitis with bronchospasm 11/15/2019    2:00 PM, 09/12/20 Mearl Latin PT, DPT Physical Therapist at [facility] [place], [place], [address] Phone: [phone]   Fax:  [phone]  Name: AIKEEM LILLEY MRN: [id number] Date of Birth: 02/06/1961

## 2020-09-12 NOTE — Therapy (Addendum)
[place], [place], [address] Phone: [phone]   Fax:  [phone]  Occupational Therapy Treatment  Patient Details  Name: Shane Chambers MRN: [id number] Date of Birth: 1961-07-15 Referring Provider (OT): [name], MD   Encounter Date: 09/12/2020   OT End of Session - 09/12/20 1451    Visit Number 2    Number of Visits 12    Date for OT Re-Evaluation 10/16/20    Authorization Type Effective 09/10/20, insurance changed to [facility] medicaid    Authorization Time Period Requesting 3 visits    OT Start Time 1430    OT Stop Time 1500    OT Time Calculation (min) 30 min    Activity Tolerance Patient limited by lethargy;Patient limited by fatigue    Behavior During Therapy [facility] for tasks assessed/performed           Past Medical History:  Diagnosis Date  . CAD (coronary artery disease)    a. s/p CABG in 03/2020 with LIMA-LAD, RIMA-PL, RA-D1-RI-OM1  . Depression   . Diabetes mellitus without complication ([place])   . Hyperlipidemia 12/01/2019  . Hypertension   . Ischemic cardiomyopathy    a. EF 20-25% by echo in 02/2020 b. at 40% by echo in 03/2020 c. EF normalized to 60-65% by echo in 04/2020  . Type 2 diabetes mellitus ([place])     Past Surgical History:  Procedure Laterality Date  . CORONARY ARTERY BYPASS GRAFT N/A 03/13/2020   Procedure: CORONARY ARTERY BYPASS GRAFTING (CABG) times five using bilateral Internal mammary arteries and left radial artery;  Surgeon: [name], MD;  Location: [place];  Service: Open Heart Surgery;  Laterality: N/A;  . DENTAL SURGERY    . RADIAL ARTERY HARVEST Left 03/13/2020   Procedure: RADIAL ARTERY HARVEST,;  Surgeon: [name], MD;  Location: [place];  Service: Open Heart Surgery;  Laterality: Left;  . RIGHT/LEFT HEART CATH AND CORONARY ANGIOGRAPHY N/A 03/07/2020   Procedure: RIGHT/LEFT HEART CATH AND CORONARY ANGIOGRAPHY;  Surgeon: Sherren Mocha, MD;  Location: [place] [facility];  Service: Cardiovascular;  Laterality: N/A;  . TEE WITHOUT CARDIOVERSION N/A 03/13/2020   Procedure: TRANSESOPHAGEAL ECHOCARDIOGRAM (TEE);  Surgeon: [name], MD;  Location: [place];  Service: Open Heart Surgery;  Laterality: N/A;    There were no vitals filed for this visit.                 OT Treatments/Exercises (OP) - 09/12/20 1453      Exercises   Exercises Shoulder;Theraputty      Shoulder Exercises: Seated   External Rotation AROM;5 reps    Flexion AROM;5 reps      Theraputty   Theraputty - Flatten seated- yellow    Theraputty - Roll seated- yellow    Theraputty - Grip seated- yellow                  OT Education - 09/12/20 1718    Education Details Reviewed therapy goals    Person(s) Educated Patient    Methods Explanation    Comprehension Verbalized understanding            OT Short Term Goals - 09/12/20 1434      OT SHORT TERM GOAL #1   Title Patient will be educated and independent in HEP in order to improve BUE functional use needed for basic self-care and desired daily tasks.    Time 3  Period Weeks    Status On-going    Target Date 09/25/20      OT SHORT TERM GOAL #2   Title Patient will report using BUE in approximately 75% of desired tasks in order to promote increased independence.    Time 3    Period Weeks    Status On-going             OT Long Term Goals - 09/12/20 1435      OT LONG TERM GOAL #1   Title Patient will decrease fascial restrictions to at least a mod amount in order to improve mobility within a pain free zone.    Time 6    Period Weeks    Status On-going      OT LONG TERM GOAL #2   Title Patient will improve BUE strength to at least a 4/5 in order to promote patient's efficiency with holding light weight items needed during basic self-care task completion.    Time 6    Period Weeks    Status On-going      OT LONG TERM GOAL #3   Title Patient will improve [name] grip strength  by at least 6# in order to improve functional use of [name] as dominant in holding and gripping items during dressing and other self-care tasks.    Time 6    Period Weeks    Status On-going      OT LONG TERM GOAL #4   Title Patient will improve BUE coordination by completing the nine hole peg test 20" faster in each hand to promote more efficiency with manipulating buttons and zippers during dressing tasks.    Time 6    Period Weeks    Status On-going                 Plan - 09/12/20 1510    Clinical Impression Statement A: Patient presented to OT session today very lethargic, and when asked if he was feeling okay, patient reported that he hasn't slept in the past couple of days and does not feel well today. Asked patient about cancelling today's session however patient requested cup of water and to attempt therapy. Completed theraputty exercises while seated in w/c. Patient expressing that he felt like he needed to put his face in a bucket of ice water for a "shock". Attempted A/ROM for shoulder, with patient completing few reps with eyes closed, then nodding off and discontinuing the exercises. Session ended early with patient advised to follow up with MD about his fatigue level and his current state of not feeling well. Patient left seated in waiting room, actively calling his mother to pick him up, with front desk staff alerted to make sure he remains okay and gets picked up from appointment. Checked in on patient in between sessions and patient had been picked up from mom.    Body Structure / Function / Physical Skills ADL;Endurance;Muscle spasms;UE functional use;Fascial restriction;Body mechanics;Pain;Coordination;IADL;Mobility;Strength    Plan P: Follow up on how pt is feeling after leaving session early previous time, follow up on HEP, scapular strengthening    [place] 10/26: Table slides 11/3: yellow theraputty    Consulted and Agree with Plan of Care Patient            Patient will benefit from skilled therapeutic intervention in order to improve the following deficits and impairments:   Body Structure / Function / Physical Skills: ADL, Endurance, Muscle spasms, UE functional use, Fascial  restriction, Body mechanics, Pain, Coordination, IADL, Mobility, Strength       Visit Diagnosis: Chronic right shoulder pain  Chronic left shoulder pain  Other symptoms and signs involving the musculoskeletal system  Other lack of coordination  Stiffness of left shoulder, not elsewhere classified  Stiffness of right shoulder, not elsewhere classified    Problem List Patient Active Problem List   Diagnosis Date Noted  . CHF exacerbation ([place]) 08/01/2020  . Visit for wound check 05/28/2020  . OSA (obstructive sleep apnea) 05/24/2020  . Acute exacerbation of chronic obstructive pulmonary disease (COPD) ([place]) 04/29/2020  . Leukocytosis 04/29/2020  . 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 Ischemic cardiomyopathy   . Coronary artery disease   . Acute on chronic combined systolic and diastolic CHF (congestive heart failure) ([place]) 03/05/2020  . Acute hyponatremia 03/05/2020  . Severe Vitamin D deficiency 12/02/2019  . Hypokalemia 12/01/2019  . Hyperlipidemia 12/01/2019  . BPH (benign prostatic hyperplasia) 12/01/2019  . Normocytic anemia 12/01/2019  . Pneumonia due to COVID-19 virus 12/01/2019  . Hypoxia   . SOB (shortness of breath) 11/16/2019  . Hypertension   . Type 2 diabetes mellitus ([place])   . Depression   . Acute bronchitis with bronchospasm 11/15/2019     [place], [place] Student [phone]   09/12/2020, 5:18 PM  [place], [place], [address] Phone: [phone]   Fax:  [phone]  Name: PAETON STUDER MRN: [id number] Date of Birth: [birth year]

## 2020-09-12 NOTE — Patient Instructions (Signed)
Home Exercises Program Theraputty Exercises  Do the following exercises at least once a day using your both hand.  1. Roll putty into a ball.  2. Make into a pancake.  3. Roll putty into a roll.  4. Pinch along log with first finger and thumb.   5. Make into a ball.  6. Roll it back into a log.   7. Pinch using thumb and side of first finger.  8. Roll into a ball, then flatten into a pancake.  9. Using your fingers, make putty into a mountain.  10. Roll putty back into a ball and squeeze gently for 2-3 minutes.

## 2020-09-13 ENCOUNTER — Telehealth: Payer: Self-pay | Admitting: Pulmonary Disease

## 2020-09-13 NOTE — Procedures (Signed)
Patient Name: Blythe, Hartshorn Date: 09/10/2020 Gender: Male D.O.B: 1961/05/27 Age (years): 67 Referring Provider: Kara Mead MD, ABSM Height (inches): 69 Interpreting Physician: Kara Mead MD, ABSM Weight (lbs): 286 RPSGT: Rosebud Poles BMI: 42 MRN: [id number] Neck Size: 20.50 <br> <br> CLINICAL INFORMATION Sleep Study Type: NPSG    Indication for sleep study: daytime sleepiness, snoring    Epworth Sleepiness Score: 16    SLEEP STUDY TECHNIQUE As per the AASM Manual for the Scoring of Sleep and Associated Events v2.3 (April 2016) with a hypopnea requiring 4% desaturations.  The channels recorded and monitored were frontal, central and occipital EEG, electrooculogram (EOG), submentalis EMG (chin), nasal and oral airflow, thoracic and abdominal wall motion, anterior tibialis EMG, snore microphone, electrocardiogram, and pulse oximetry.  MEDICATIONS Medications self-administered by patient taken the night of the study : N/A  SLEEP ARCHITECTURE The study was initiated at 11:25:33 PM and ended at 5:24:20 AM.  Sleep onset time was 21.2 minutes and the sleep efficiency was 9.2%%. The total sleep time was 33 minutes.  Stage REM latency was N/A minutes.  The patient spent 30.3%% of the night in stage N1 sleep, 69.7%% in stage N2 sleep, 0.0%% in stage N3 and 0% in REM.  Alpha intrusion was absent.  Supine sleep was 77.27%.  RESPIRATORY PARAMETERS The overall apnea/hypopnea index (AHI) was 45.5 per hour. There were 0 total apneas, including 0 obstructive, 0 central and 0 mixed apneas. There were 25 hypopneas and 0 RERAs.  The AHI during Stage REM sleep was N/A per hour.  AHI while supine was 56.5 per hour.  The mean oxygen saturation was 92.5%. The minimum SpO2 during sleep was 82.0%.  moderate snoring was noted during this study.  CARDIAC DATA The 2 lead EKG demonstrated sinus rhythm. The mean heart rate was 96.8 beats per minute. Other EKG findings include:  None. LEG MOVEMENT DATA The total PLMS were 0 with a resulting PLMS index of 0.0. Associated arousal with leg movement index was 0.0 .  IMPRESSIONS - Severe obstructive sleep apnea occurred during this study (AHI = 45.5/h). Total sleep time was only 33 mins - No significant central sleep apnea occurred during this study (CAI = 0.0/h). - Mild oxygen desaturation was noted during this study (Min O2 = 82.0%). - The patient snored with moderate snoring volume. - No cardiac abnormalities were noted during this study. - Clinically significant periodic limb movements did not occur during sleep. No significant associated arousals.  DIAGNOSIS - Obstructive Sleep Apnea (G47.33)  RECOMMENDATIONS - Total sleep time was very low so difficult to make recommendations. Consider repeating study vs empiric CPAP therapy because he probably has severe OSA - Avoid alcohol, sedatives and other CNS depressants that may worsen sleep apnea and disrupt normal sleep architecture. - Sleep hygiene should be reviewed to assess factors that may improve sleep quality. - Weight management and regular exercise should be initiated or continued if appropriate.  Kara Mead MD Board Certified in [place]

## 2020-09-13 NOTE — Telephone Encounter (Signed)
He only slept for 33 minutes during the study so difficult to make recommendations. But even during this brief time he had 25 obstructive events/hypopneas  So he probably has severe OSA. Would recommend  starting on CPAP to see how much he benefits from this.  Alternatively, we can repeat study but I doubt this is necessary

## 2020-09-14 ENCOUNTER — Ambulatory Visit (HOSPITAL_COMMUNITY): Payer: Medicaid Other

## 2020-09-14 NOTE — Telephone Encounter (Signed)
Called and left message on voicemail to please return phone call to go over results. Contact number provided. 

## 2020-09-17 ENCOUNTER — Ambulatory Visit (HOSPITAL_COMMUNITY): Payer: Medicaid Other

## 2020-09-18 ENCOUNTER — Ambulatory Visit (HOSPITAL_COMMUNITY): Payer: Medicaid Other

## 2020-09-18 ENCOUNTER — Telehealth (HOSPITAL_COMMUNITY): Payer: Self-pay

## 2020-09-18 ENCOUNTER — Telehealth: Payer: Self-pay | Admitting: Pulmonary Disease

## 2020-09-18 ENCOUNTER — Encounter (HOSPITAL_COMMUNITY): Payer: Self-pay

## 2020-09-18 NOTE — Telephone Encounter (Signed)
° °  Left message in regards to patient's missed visit on 11/9, with reminder of next appointment on 11/12 provided.   [place], [place] Student [phone]

## 2020-09-18 NOTE — Telephone Encounter (Signed)
pt requested to cancel the remainder of his appts because he has lost his insurance

## 2020-09-18 NOTE — Telephone Encounter (Signed)
ATC no voice mail to leave message on.

## 2020-09-19 ENCOUNTER — Encounter (HOSPITAL_COMMUNITY): Payer: Self-pay | Admitting: Physical Therapy

## 2020-09-19 ENCOUNTER — Encounter (HOSPITAL_COMMUNITY): Payer: Self-pay

## 2020-09-19 ENCOUNTER — Ambulatory Visit (HOSPITAL_COMMUNITY): Payer: Medicaid Other | Admitting: Physical Therapy

## 2020-09-19 ENCOUNTER — Other Ambulatory Visit: Payer: Self-pay

## 2020-09-19 DIAGNOSIS — G4733 Obstructive sleep apnea (adult) (pediatric): Secondary | ICD-10-CM

## 2020-09-19 NOTE — Therapy (Signed)
[place], [place], [address] Phone: [phone]   Fax:  [phone]  Patient Details  Name: Shane Chambers MRN: [id number] Date of Birth: 1961-08-05 Referring Provider:  No ref. provider found  Encounter Date: 09/19/2020  OCCUPATIONAL THERAPY DISCHARGE SUMMARY  Visits from Start of Care: 2  Current functional level related to goals / functional outcomes: Edi called to cancel all remaining appointments reporting that he no longer has his insurance. No goals were met.    Remaining deficits: All deficits remain from evaluation. Decreased strength and endurance.    Education / Equipment: HEP Plan: Patient agrees to discharge.  Patient goals were not met. Patient is being discharged due to the patient's request.  ?????            09/19/2020, 8:25 AM  [place], [place], [address] Phone: [phone]   Fax:  [phone]

## 2020-09-19 NOTE — Telephone Encounter (Signed)
ATC patient- no answer with no option to leave vm. Encounter has been closed per office protocol.

## 2020-09-19 NOTE — Telephone Encounter (Signed)
Patient returned phone call and writer went over results and recommendations per Dr Elsworth Soho. Patient expressed understanding and agreeable to CPAP being ordered. Dr Elsworth Soho please advise as to what you would like me to order. Patient stated there was nothing else needed at this time.

## 2020-09-19 NOTE — Therapy (Signed)
[place], [place], [address] Phone: [phone]   Fax:  [phone]  Patient Details  Name: Shane Chambers MRN: [id number] Date of Birth: [birth year] Referring Provider:  No ref. provider found  Encounter Date: 09/19/2020   PHYSICAL THERAPY DISCHARGE SUMMARY  Visits from Start of Care: 8  Current functional level related to goals / functional outcomes: Patient called to cancel remaining appointments stating that he no longer had insurance. He did not meet any goals.    Remaining deficits: He continues to be limited by pain and deficits in bilateral LE strength, gait, activity tolerance, transfers, endurance, and functional mobility.   Education / Equipment: HEP  Plan: Patient agrees to discharge.  Patient goals were not met. Patient is being discharged due to the patient's request.  ?????      9:23 AM, 09/19/20 Mearl Latin PT, DPT Physical Therapist at [place], [place], [address] Phone: [phone]   Fax:  [phone]

## 2020-09-19 NOTE — Telephone Encounter (Signed)
AutoCPAP 5-15 cm OV in 6-8 weeks

## 2020-09-19 NOTE — Telephone Encounter (Signed)
Order placed for AutoCPAP 5-15 cm per Dr Elsworth Soho. Called and left message for patient stating this was completed as we had discussed in the previous phone call. Advised patient to please feel free to contact us if any questions/concerns. Contact number provided. Nothing further needed at this time.

## 2020-09-20 ENCOUNTER — Encounter (HOSPITAL_COMMUNITY): Payer: Medicaid Other | Admitting: Physical Therapy

## 2020-09-21 ENCOUNTER — Ambulatory Visit (HOSPITAL_COMMUNITY): Payer: Medicaid Other

## 2020-09-24 ENCOUNTER — Ambulatory Visit (HOSPITAL_COMMUNITY): Payer: Medicaid Other | Admitting: Physical Therapy

## 2020-09-25 ENCOUNTER — Encounter (HOSPITAL_COMMUNITY): Payer: Medicaid Other | Admitting: Occupational Therapy

## 2020-09-26 ENCOUNTER — Encounter (HOSPITAL_COMMUNITY): Payer: Medicaid Other | Admitting: Occupational Therapy

## 2020-09-26 ENCOUNTER — Encounter (HOSPITAL_COMMUNITY): Payer: Medicaid Other | Admitting: Physical Therapy

## 2020-09-28 ENCOUNTER — Encounter (HOSPITAL_COMMUNITY): Payer: Medicaid Other

## 2020-10-02 ENCOUNTER — Encounter (HOSPITAL_COMMUNITY): Payer: Medicaid Other | Admitting: Occupational Therapy

## 2020-10-03 ENCOUNTER — Encounter (HOSPITAL_COMMUNITY): Payer: Medicaid Other

## 2020-10-05 ENCOUNTER — Ambulatory Visit: Payer: Medicaid Other | Admitting: Pulmonary Disease

## 2020-10-08 ENCOUNTER — Encounter (HOSPITAL_COMMUNITY): Payer: Medicaid Other

## 2020-10-10 ENCOUNTER — Encounter (HOSPITAL_COMMUNITY): Payer: Medicaid Other | Admitting: Occupational Therapy

## 2020-10-10 ENCOUNTER — Other Ambulatory Visit: Payer: Self-pay | Admitting: *Deleted

## 2020-10-10 MED ORDER — TORSEMIDE 20 MG PO TABS: 40.0000 mg | ORAL_TABLET | Freq: Two times a day (BID) | ORAL | 3 refills | Status: DC

## 2020-10-15 ENCOUNTER — Encounter (HOSPITAL_COMMUNITY): Payer: Self-pay

## 2020-10-15 ENCOUNTER — Other Ambulatory Visit: Payer: Self-pay

## 2020-10-15 ENCOUNTER — Emergency Department (HOSPITAL_COMMUNITY): Payer: Medicaid Other

## 2020-10-15 ENCOUNTER — Emergency Department (HOSPITAL_COMMUNITY)
Admission: EM | Admit: 2020-10-15 | Discharge: 2020-10-15 | Disposition: A | Discharge status: Home / Self Care | Payer: Medicaid Other | Attending: Emergency Medicine | Admitting: Emergency Medicine

## 2020-10-15 ENCOUNTER — Encounter (HOSPITAL_COMMUNITY): Payer: Medicaid Other

## 2020-10-15 DIAGNOSIS — Z8616 Personal history of COVID-19: Secondary | ICD-10-CM | POA: Diagnosis not present

## 2020-10-15 DIAGNOSIS — I2510 Atherosclerotic heart disease of native coronary artery without angina pectoris: Secondary | ICD-10-CM | POA: Diagnosis not present

## 2020-10-15 DIAGNOSIS — L03119 Cellulitis of unspecified part of limb: Secondary | ICD-10-CM

## 2020-10-15 DIAGNOSIS — M79661 Pain in right lower leg: Secondary | ICD-10-CM | POA: Insufficient documentation

## 2020-10-15 DIAGNOSIS — E1169 Type 2 diabetes mellitus with other specified complication: Secondary | ICD-10-CM | POA: Diagnosis not present

## 2020-10-15 DIAGNOSIS — Z794 Long term (current) use of insulin: Secondary | ICD-10-CM | POA: Diagnosis not present

## 2020-10-15 DIAGNOSIS — I110 Hypertensive heart disease with heart failure: Secondary | ICD-10-CM | POA: Insufficient documentation

## 2020-10-15 DIAGNOSIS — I5043 Acute on chronic combined systolic (congestive) and diastolic (congestive) heart failure: Secondary | ICD-10-CM | POA: Diagnosis not present

## 2020-10-15 DIAGNOSIS — Z79899 Other long term (current) drug therapy: Secondary | ICD-10-CM | POA: Diagnosis not present

## 2020-10-15 DIAGNOSIS — J441 Chronic obstructive pulmonary disease with (acute) exacerbation: Secondary | ICD-10-CM | POA: Insufficient documentation

## 2020-10-15 DIAGNOSIS — I25810 Atherosclerosis of coronary artery bypass graft(s) without angina pectoris: Secondary | ICD-10-CM | POA: Diagnosis not present

## 2020-10-15 DIAGNOSIS — Z87891 Personal history of nicotine dependence: Secondary | ICD-10-CM | POA: Insufficient documentation

## 2020-10-15 DIAGNOSIS — Z7982 Long term (current) use of aspirin: Secondary | ICD-10-CM | POA: Insufficient documentation

## 2020-10-15 DIAGNOSIS — M79662 Pain in left lower leg: Secondary | ICD-10-CM | POA: Diagnosis present

## 2020-10-15 DIAGNOSIS — R0602 Shortness of breath: Secondary | ICD-10-CM | POA: Diagnosis not present

## 2020-10-15 DIAGNOSIS — Z7984 Long term (current) use of oral hypoglycemic drugs: Secondary | ICD-10-CM | POA: Insufficient documentation

## 2020-10-15 LAB — CBC WITH DIFFERENTIAL/PLATELET
Abs Immature Granulocytes: 0.16 10*3/uL — ABNORMAL HIGH (ref 0.00–0.07)
Basophils Absolute: 0.1 10*3/uL (ref 0.0–0.1)
Basophils Relative: 0 %
Eosinophils Absolute: 0.7 10*3/uL — ABNORMAL HIGH (ref 0.0–0.5)
Eosinophils Relative: 5 %
HCT: 37.9 % — ABNORMAL LOW (ref 39.0–52.0)
Hemoglobin: 12.1 g/dL — ABNORMAL LOW (ref 13.0–17.0)
Immature Granulocytes: 1 %
Lymphocytes Relative: 14 %
Lymphs Abs: 1.9 10*3/uL (ref 0.7–4.0)
MCH: 27.7 pg (ref 26.0–34.0)
MCHC: 31.9 g/dL (ref 30.0–36.0)
MCV: 86.7 fL (ref 80.0–100.0)
Monocytes Absolute: 1.1 10*3/uL — ABNORMAL HIGH (ref 0.1–1.0)
Monocytes Relative: 8 %
Neutro Abs: 10 10*3/uL — ABNORMAL HIGH (ref 1.7–7.7)
Neutrophils Relative %: 72 %
Platelets: 376 10*3/uL (ref 150–400)
RBC: 4.37 MIL/uL (ref 4.22–5.81)
RDW: 13.8 % (ref 11.5–15.5)
WBC: 14 10*3/uL — ABNORMAL HIGH (ref 4.0–10.5)
nRBC: 0 % (ref 0.0–0.2)

## 2020-10-15 LAB — COMPREHENSIVE METABOLIC PANEL
ALT: 21 U/L (ref 0–44)
AST: 24 U/L (ref 15–41)
Albumin: 4 g/dL (ref 3.5–5.0)
Alkaline Phosphatase: 128 U/L — ABNORMAL HIGH (ref 38–126)
Anion gap: 12 (ref 5–15)
BUN: 21 mg/dL — ABNORMAL HIGH (ref 6–20)
CO2: 30 mmol/L (ref 22–32)
Calcium: 9.9 mg/dL (ref 8.9–10.3)
Chloride: 91 mmol/L — ABNORMAL LOW (ref 98–111)
Creatinine, Ser: 0.96 mg/dL (ref 0.61–1.24)
GFR, Estimated: >60 mL/min (ref >60)
Glucose, Bld: 257 mg/dL — ABNORMAL HIGH (ref 70–99)
Potassium: 3.8 mmol/L (ref 3.5–5.1)
Sodium: 133 mmol/L — ABNORMAL LOW (ref 135–145)
Total Bilirubin: 0.7 mg/dL (ref 0.3–1.2)
Total Protein: 8.2 g/dL — ABNORMAL HIGH (ref 6.5–8.1)

## 2020-10-15 LAB — TROPONIN I (HIGH SENSITIVITY)
Troponin I (High Sensitivity): 14 ng/L (ref <18)
Troponin I (High Sensitivity): 15 ng/L (ref <18)

## 2020-10-15 LAB — BRAIN NATRIURETIC PEPTIDE: B Natriuretic Peptide: 191 pg/mL — ABNORMAL HIGH (ref 0.0–100.0)

## 2020-10-15 IMAGING — DX DG CHEST 1V PORT
1 series · 1 of 1 positions shown · non-contrast
Comparison: [DATE]

CLINICAL DATA: Shortness of breath with bilateral leg swelling

EXAM:
PORTABLE CHEST 1 VIEW

[chest ap]
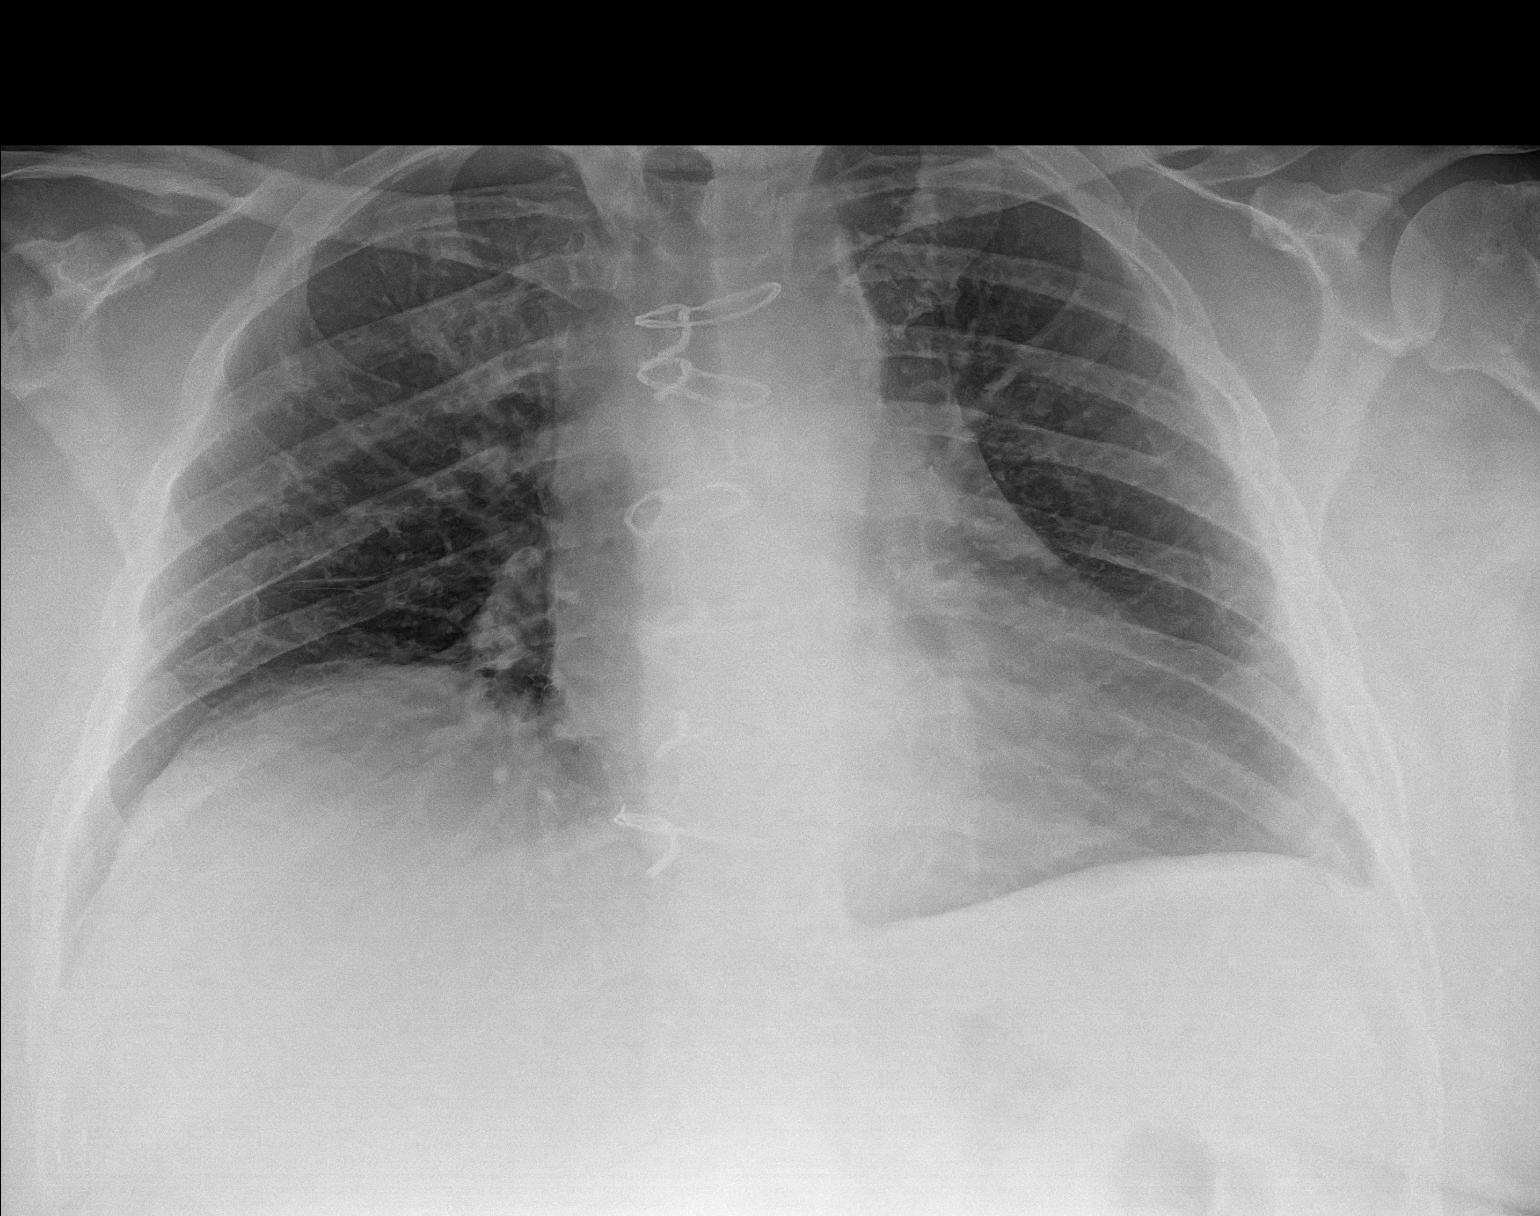

[1 of 1 positions shown; findings below may reference images not displayed]

FINDINGS: Post median sternotomy. Heart size is enlarged as on previous
imaging, accentuated by portable technique.

Improved aeration when compared to the prior examination from
[DATE]. Lungs are clear. No sign of pleural effusion on
frontal radiograph.

On limited assessment no acute skeletal process.
IMPRESSION: Improved aeration when compared to the prior exam from [4C]. No
acute cardiopulmonary disease.

## 2020-10-15 MED ORDER — DOXYCYCLINE HYCLATE 100 MG PO CAPS: 100.0000 mg | ORAL_CAPSULE | Freq: Two times a day (BID) | ORAL | 0 refills | Status: DC

## 2020-10-15 MED ORDER — TRIAMCINOLONE ACETONIDE 0.1 % EX CREA: 1.0000 "application " | TOPICAL_CREAM | Freq: Three times a day (TID) | CUTANEOUS | 0 refills | Status: DC

## 2020-10-15 MED ORDER — DOXYCYCLINE HYCLATE 100 MG PO TABS
100.0000 mg | ORAL_TABLET | Freq: Once | ORAL | Status: AC
  Administered 2020-10-15: 100 mg via ORAL
  Filled 2020-10-15: qty 1

## 2020-10-15 NOTE — Discharge Instructions (Addendum)
Your work-up today did not show evidence of fluid overload.  It is important that you elevate your legs as much as possible.  Take your Demadex daily.  Take the antibiotic as directed until it is finished.  Try to avoid scratching your legs.  You may take over-the-counter Benadryl 1 every 6 hours if needed for itching.  The cream that has been prescribed for you may also help with itching.  Be sure to follow-up with your primary doctor later this week for recheck.  Return to the emergency department if you develop any worsening symptoms such as fever, chills, increasing pain redness or swelling of your legs.

## 2020-10-15 NOTE — ED Triage Notes (Signed)
States bilat leg sores that are not healing. Leg swelling with edema which is an ongoing problem. States left leg has had increased swelling that is not his normal. C/o generalized stiffness that is mostly in legs. Wheelchair to triage. Multiple sores notes bilat legs.

## 2020-10-15 NOTE — ED Provider Notes (Signed)
[facility] EMERGENCY DEPARTMENT Provider Note   CSN: [id number] Arrival date & time: 10/15/20  0945     History Chief Complaint  Patient presents with  . Leg Pain    Shane Chambers is a 59 y.o. male.  HPI      Shane Chambers is a 59 y.o. male with PMH of DM, HTN, CAD, COPD and CHF who presents to the Emergency Department complaining of multiple non healing sores to both legs.  Symptoms present for greater than one month.  Also endorses increased swelling to both legs and shortness of breath.  Mild redenss of both legs as well.  Usually take Demadex daily, but has not taken it today.  He denies chest pain, fever, chills, abdominal pain or swelling.    Past Medical History:  Diagnosis Date  . CAD (coronary artery disease)    a. s/p CABG in 03/2020 with LIMA-LAD, RIMA-PL, RA-D1-RI-OM1  . Depression   . Diabetes mellitus without complication ([place])   . Hyperlipidemia 12/01/2019  . Hypertension   . Ischemic cardiomyopathy    a. EF 20-25% by echo in 02/2020 b. at 40% by echo in 03/2020 c. EF normalized to 60-65% by echo in 04/2020  . Type 2 diabetes mellitus [facility])     Patient Active Problem List   Diagnosis Date Noted  . CHF exacerbation ([place]) 08/01/2020  . Visit for wound check 05/28/2020  . OSA (obstructive sleep apnea) 05/24/2020  . Acute exacerbation of chronic obstructive pulmonary disease (COPD) ([place]) 04/29/2020  . Leukocytosis 04/29/2020  . 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 Ischemic cardiomyopathy   . Coronary artery disease   . Acute on chronic combined systolic and diastolic CHF (congestive heart failure) ([place]) 03/05/2020  . Acute hyponatremia 03/05/2020  . Severe Vitamin D deficiency 12/02/2019  . Hypokalemia 12/01/2019  . Hyperlipidemia 12/01/2019  . BPH (benign prostatic hyperplasia) 12/01/2019  . Normocytic anemia 12/01/2019  . Pneumonia due to COVID-19 virus 12/01/2019  . Hypoxia   . SOB (shortness of breath) 11/16/2019  . Hypertension   . Type 2 diabetes  mellitus ([place])   . Depression   . Acute bronchitis with bronchospasm 11/15/2019    Past Surgical History:  Procedure Laterality Date  . CORONARY ARTERY BYPASS GRAFT N/A 03/13/2020   Procedure: CORONARY ARTERY BYPASS GRAFTING (CABG) times five using bilateral Internal mammary arteries and left radial artery;  Surgeon: [name], MD;  Location: [place];  Service: Open Heart Surgery;  Laterality: N/A;  . DENTAL SURGERY    . RADIAL ARTERY HARVEST Left 03/13/2020   Procedure: RADIAL ARTERY HARVEST,;  Surgeon: [name], MD;  Location: [place];  Service: Open Heart Surgery;  Laterality: Left;  . RIGHT/LEFT HEART CATH AND CORONARY ANGIOGRAPHY N/A 03/07/2020   Procedure: RIGHT/LEFT HEART CATH AND CORONARY ANGIOGRAPHY;  Surgeon: Sherren Mocha, MD;  Location: [place] [facility];  Service: Cardiovascular;  Laterality: N/A;  . TEE WITHOUT CARDIOVERSION N/A 03/13/2020   Procedure: TRANSESOPHAGEAL ECHOCARDIOGRAM (TEE);  Surgeon: [name], MD;  Location: [place];  Service: Open Heart Surgery;  Laterality: N/A;       Family History  Problem Relation Age of Onset  . Hypertension Mother     Social History   Tobacco Use  . Smoking status: Former Smoker    Packs/day: 1.50    Types: Cigarettes    Quit date: 05/25/1995    Years since quitting: 25.4  . Smokeless tobacco: Never Used  Vaping Use  .  Vaping Use: Never used  Substance Use Topics  . Alcohol use: Yes    Comment: rarely  . Drug use: No    Home Medications Prior to Admission medications   Medication Sig Start Date End Date Taking? Authorizing Provider  albuterol (VENTOLIN HFA) 108 (90 Base) MCG/ACT inhaler Inhale 1-2 puffs into the lungs every 6 (six) hours as needed for wheezing or shortness of breath. 05/24/20   Rigoberto Noel, MD  aspirin 81 MG EC tablet Take 81 mg by mouth daily.  03/10/16   [provider]  atorvastatin (LIPITOR) 80 MG tablet Take 80 mg by mouth daily.    [provider]    budesonide-formoterol (SYMBICORT) 160-4.5 MCG/ACT inhaler Inhale 2 puffs into the lungs in the morning and at bedtime. 05/24/20   Rigoberto Noel, MD  calcium carbonate (OS-CAL - DOSED IN MG OF ELEMENTAL CALCIUM) 1250 (500 Ca) MG tablet Take 1 tablet by mouth daily.    [provider]  clopidogrel (PLAVIX) 75 MG tablet Take 1 tablet (75 mg total) by mouth daily. 08/21/20   Verta Ellen., NP  docusate sodium (COLACE) 100 MG capsule Take 1 capsule (100 mg total) by mouth 2 (two) times daily. Patient taking differently: Take 100 mg by mouth 2 (two) times daily as needed for mild constipation.  12/25/19   Manuella Ghazi, Pratik D, DO  ferrous sulfate 325 (65 FE) MG tablet Take 1 tablet (325 mg total) by mouth every other day. 05/03/20   Shahmehdi, Valeria Batman, MD  fluticasone (VERAMYST) 27.5 MCG/SPRAY nasal spray Place 2 sprays into the nose daily as needed for rhinitis or allergies.     [provider]  gabapentin (NEURONTIN) 300 MG capsule Take 900 mg by mouth 2 (two) times daily.    [provider]  guaiFENesin-dextromethorphan (ROBITUSSIN DM) 100-10 MG/5ML syrup Take 5 mLs by mouth every 4 (four) hours as needed for cough. 05/03/20   Shahmehdi, Valeria Batman, MD  insulin aspart (NOVOLOG) 100 UNIT/ML injection Inject 0-20 Units into the skin 3 (three) times daily with meals. 05/03/20   Shahmehdi, Valeria Batman, MD  insulin aspart protamine- aspart (NOVOLOG MIX 70/30) (70-30) 100 UNIT/ML injection Inject 0.55 mLs (55 Units total) into the skin 2 (two) times daily with a meal. 05/03/20   Shahmehdi, Seyed A, MD  ketoconazole (NIZORAL) 2 % cream Apply 1 application topically daily. Apply twice daily for 2 weeks to the ears 06/04/20   Recardo Evangelist, PA-C  losartan (COZAAR) 25 MG tablet Take 0.5 tablets (12.5 mg total) by mouth daily. 08/21/20   Verta Ellen., NP  MAGNESIUM PO Take 400 mg by mouth daily.     [provider]  metFORMIN (GLUCOPHAGE) 1000 MG tablet Take 1,000 mg by mouth 2  (two) times daily with a meal.    [provider]  metoprolol succinate (TOPROL-XL) 25 MG 24 hr tablet Take 0.5 tablets (12.5 mg total) by mouth 2 (two) times daily. 08/21/20   Verta Ellen., NP  Multiple Vitamins-Minerals (TAB-A-VITE) TABS Take 1 tablet by mouth daily. 07/27/19   [provider]  omeprazole (PRILOSEC) 40 MG capsule Take 40 mg by mouth daily.    [provider]  polyethylene glycol (MIRALAX / GLYCOLAX) 17 g packet Take 17 g by mouth daily as needed for mild constipation. 12/25/19   Manuella Ghazi, Pratik D, DO  potassium chloride SA (KLOR-CON) 20 MEQ tablet Take 1 tablet (20 mEq total) by mouth daily. 08/21/20  Verta Ellen., NP  tamsulosin (FLOMAX) 0.4 MG CAPS capsule Take 0.4 mg by mouth daily.    [provider]  torsemide (DEMADEX) 20 MG tablet Take 2 tablets (40 mg total) by mouth 2 (two) times daily. May take an extra pill for 3 lbs weight gain in 24 hours or 5 lbs in one week 10/10/20 11/09/20  Arnoldo Lenis, MD  traMADol (ULTRAM) 50 MG tablet Take 1-2 tablets (50-100 mg total) by mouth every 4 (four) hours as needed for moderate pain. 03/19/20   Barrett, Erin R, PA-C  venlafaxine XR (EFFEXOR-XR) 150 MG 24 hr capsule Take 150 mg by mouth daily with breakfast.    [provider]    Allergies    Ace inhibitors, Other, and Penicillins  Review of Systems   Review of Systems  Constitutional: Negative for activity change, appetite change, chills and fever.  HENT: Negative for trouble swallowing.   Respiratory: Positive for shortness of breath. Negative for chest tightness and wheezing.   Cardiovascular: Negative for chest pain.  Gastrointestinal: Negative for abdominal pain, nausea and vomiting.  Genitourinary: Negative for decreased urine volume and difficulty urinating.  Musculoskeletal: Positive for myalgias. Negative for neck pain and neck stiffness.  Skin: Positive for rash. Negative for wound.  Neurological: Negative  for dizziness, syncope, weakness, numbness and headaches.    Physical Exam Updated Vital Signs BP (!) 160/75 (BP Location: Left Arm)   Pulse 93   Temp 98.5 F (36.9 C) (Oral)   Resp (!) 22   Ht 5\' 9"  (1.753 m)   Wt 129.7 kg   SpO2 96%   BMI 42.23 kg/m   Physical Exam Vitals and nursing note reviewed.  Constitutional:      General: He is not in acute distress.    Appearance: He is well-developed. He is obese. He is not toxic-appearing.     Comments: Pt appears older than stated age.   HENT:     Head: Atraumatic.     Mouth/Throat:     Mouth: Mucous membranes are moist.  Cardiovascular:     Rate and Rhythm: Normal rate and regular rhythm.     Pulses: Normal pulses.     Heart sounds: No murmur heard.   Pulmonary:     Effort: Pulmonary effort is normal. No respiratory distress.     Breath sounds: No wheezing or rhonchi.     Comments: Lungs clear, no wheezing or rales Chest:     Chest wall: No tenderness.  Abdominal:     Palpations: Abdomen is soft.     Tenderness: There is no abdominal tenderness. There is no right CVA tenderness or left CVA tenderness.  Musculoskeletal:        General: No tenderness.     Cervical back: Normal range of motion and neck supple.     Right lower leg: Edema present.     Left lower leg: Edema present.     Comments: 2+ edema of the bilateral LE's.  Mild to moderate erythema.    Lymphadenopathy:     Cervical: No cervical adenopathy.  Skin:    General: Skin is warm.     Capillary Refill: Capillary refill takes less than 2 seconds.     Findings: Erythema and rash present.     Comments: Multiple chronic appearing papules and excoriations to the BLE's.  Eschar present to the lateral left upper leg.  No weeping or open wounds. See attached photos.    Neurological:  Mental Status: He is alert.     Sensory: No sensory deficit.     Motor: No weakness or abnormal muscle tone.     Coordination: Coordination normal.         patient gave  verbal consent for images to be stored in the medical record.     ED Results / Procedures / Treatments   Labs (all labs ordered are listed, but only abnormal results are displayed) Labs Reviewed  COMPREHENSIVE METABOLIC PANEL - Abnormal; Notable for the following components:      Result Value   Sodium 133 (*)    Chloride 91 (*)    Glucose, Bld 257 (*)    BUN 21 (*)    Total Protein 8.2 (*)    Alkaline Phosphatase 128 (*)    All other components within normal limits  BRAIN NATRIURETIC PEPTIDE - Abnormal; Notable for the following components:   B Natriuretic Peptide 191.0 (*)    All other components within normal limits  CBC WITH DIFFERENTIAL/PLATELET - Abnormal; Notable for the following components:   WBC 14.0 (*)    Hemoglobin 12.1 (*)    HCT 37.9 (*)    Neutro Abs 10.0 (*)    Monocytes Absolute 1.1 (*)    Eosinophils Absolute 0.7 (*)    Abs Immature Granulocytes 0.16 (*)    All other components within normal limits  TROPONIN I (HIGH SENSITIVITY)  TROPONIN I (HIGH SENSITIVITY)    EKG EKG Interpretation  Date/Time:  Monday October 15 2020 16:30:27 EST Ventricular Rate:  90 PR Interval:  176 QRS Duration: 100 QT Interval:  402 QTC Calculation: 491 R Axis:     Text Interpretation: Normal sinus rhythm Minimal voltage criteria for LVH, may be normal variant ( R in aVL ) Possible Inferior infarct , age undetermined Cannot rule out Anterior infarct , age undetermined Abnormal ECG Confirmed by Carmin Muskrat [phone]) on 10/15/2020 4:46:52 PM   Radiology DG Chest Portable 1 View  Result Date: 10/15/2020 CLINICAL DATA:  Shortness of breath with bilateral leg swelling EXAM: PORTABLE CHEST 1 VIEW COMPARISON:  08/01/2020 FINDINGS: Post median sternotomy. Heart size is enlarged as on previous imaging, accentuated by portable technique. Improved aeration when compared to the prior examination from September of 2021. Lungs are clear. No sign of pleural effusion on frontal radiograph.  On limited assessment no acute skeletal process. IMPRESSION: Improved aeration when compared to the prior exam from 2021. No acute cardiopulmonary disease. Electronically Signed   By: Zetta Bills M.D.   On: 10/15/2020 13:51    Procedures Procedures (including critical care time)  Medications Ordered in ED Medications - No data to display  ED Course  I have reviewed the triage vital signs and the nursing notes.  Pertinent labs & imaging results that were available during my care of the patient were reviewed by me and considered in my medical decision making (see chart for details).    MDM Rules/Calculators/A&P                          Pt here with multiple lesions of the BLE's, areas are excoriated and pruritic.  Appear c/w dermatitis, but likely has developed a secondary bacterial infection from scratching.  History of CHF and takes diuretic.  Mildly increased work of breathing on initial exam, but on recheck breathing has improved.  Vital signs reassuring.  He is not hypoxic or tachycardic.  Labs show mildly elevated BNP, leukocytosis with white  count of 14.  Kidney function unremarkable.  No acute ischemic changes on EKG and chest x-ray is without evidence of CHF.  Delta troponin reassuring.  Overall, patient is nontoxic-appearing.  I feel that he is appropriate for outpatient treatment of likely early developing cellulitis of the lower extremities.  He is agreeable to oral antibiotics and close outpatient follow-up.  Return precautions were also discussed.  Also discussed importance of taking his daily diuretic.   Final Clinical Impression(s) / ED Diagnoses Final diagnoses:  Cellulitis of lower extremity, unspecified laterality    Rx / DC Orders ED Discharge Orders    None       Kem Parkinson, PA-C 10/15/20 1705    Fredia Sorrow, MD 10/16/20 1537

## 2020-10-17 ENCOUNTER — Encounter (HOSPITAL_COMMUNITY): Payer: Medicaid Other

## 2020-10-25 ENCOUNTER — Emergency Department (HOSPITAL_COMMUNITY): Payer: Medicaid Other

## 2020-10-25 ENCOUNTER — Emergency Department (HOSPITAL_COMMUNITY)
Admission: EM | Admit: 2020-10-25 | Discharge: 2020-10-25 | Disposition: A | Discharge status: Home / Self Care | Payer: Medicaid Other | Attending: Emergency Medicine | Admitting: Emergency Medicine

## 2020-10-25 ENCOUNTER — Encounter (HOSPITAL_COMMUNITY): Payer: Self-pay | Admitting: *Deleted

## 2020-10-25 DIAGNOSIS — I5043 Acute on chronic combined systolic (congestive) and diastolic (congestive) heart failure: Secondary | ICD-10-CM | POA: Diagnosis not present

## 2020-10-25 DIAGNOSIS — Z951 Presence of aortocoronary bypass graft: Secondary | ICD-10-CM | POA: Diagnosis not present

## 2020-10-25 DIAGNOSIS — Z87891 Personal history of nicotine dependence: Secondary | ICD-10-CM | POA: Diagnosis not present

## 2020-10-25 DIAGNOSIS — Z8616 Personal history of COVID-19: Secondary | ICD-10-CM | POA: Insufficient documentation

## 2020-10-25 DIAGNOSIS — Z7982 Long term (current) use of aspirin: Secondary | ICD-10-CM | POA: Diagnosis not present

## 2020-10-25 DIAGNOSIS — L0100 Impetigo, unspecified: Secondary | ICD-10-CM

## 2020-10-25 DIAGNOSIS — M7989 Other specified soft tissue disorders: Secondary | ICD-10-CM | POA: Diagnosis present

## 2020-10-25 DIAGNOSIS — R42 Dizziness and giddiness: Secondary | ICD-10-CM | POA: Insufficient documentation

## 2020-10-25 DIAGNOSIS — E119 Type 2 diabetes mellitus without complications: Secondary | ICD-10-CM | POA: Diagnosis not present

## 2020-10-25 DIAGNOSIS — I110 Hypertensive heart disease with heart failure: Secondary | ICD-10-CM | POA: Diagnosis not present

## 2020-10-25 DIAGNOSIS — Z7984 Long term (current) use of oral hypoglycemic drugs: Secondary | ICD-10-CM | POA: Insufficient documentation

## 2020-10-25 DIAGNOSIS — I872 Venous insufficiency (chronic) (peripheral): Secondary | ICD-10-CM

## 2020-10-25 DIAGNOSIS — I2510 Atherosclerotic heart disease of native coronary artery without angina pectoris: Secondary | ICD-10-CM | POA: Insufficient documentation

## 2020-10-25 DIAGNOSIS — Z79899 Other long term (current) drug therapy: Secondary | ICD-10-CM | POA: Diagnosis not present

## 2020-10-25 DIAGNOSIS — R0602 Shortness of breath: Secondary | ICD-10-CM

## 2020-10-25 DIAGNOSIS — Z794 Long term (current) use of insulin: Secondary | ICD-10-CM | POA: Insufficient documentation

## 2020-10-25 LAB — CBC WITH DIFFERENTIAL/PLATELET
Abs Immature Granulocytes: 0.24 10*3/uL — ABNORMAL HIGH (ref 0.00–0.07)
Basophils Absolute: 0.1 10*3/uL (ref 0.0–0.1)
Basophils Relative: 1 %
Eosinophils Absolute: 0.5 10*3/uL (ref 0.0–0.5)
Eosinophils Relative: 4 %
HCT: 36.5 % — ABNORMAL LOW (ref 39.0–52.0)
Hemoglobin: 11.6 g/dL — ABNORMAL LOW (ref 13.0–17.0)
Immature Granulocytes: 2 %
Lymphocytes Relative: 14 %
Lymphs Abs: 1.7 10*3/uL (ref 0.7–4.0)
MCH: 27.4 pg (ref 26.0–34.0)
MCHC: 31.8 g/dL (ref 30.0–36.0)
MCV: 86.3 fL (ref 80.0–100.0)
Monocytes Absolute: 1 10*3/uL (ref 0.1–1.0)
Monocytes Relative: 8 %
Neutro Abs: 9 10*3/uL — ABNORMAL HIGH (ref 1.7–7.7)
Neutrophils Relative %: 71 %
Platelets: 349 10*3/uL (ref 150–400)
RBC: 4.23 MIL/uL (ref 4.22–5.81)
RDW: 13.3 % (ref 11.5–15.5)
WBC: 12.5 10*3/uL — ABNORMAL HIGH (ref 4.0–10.5)
nRBC: 0 % (ref 0.0–0.2)

## 2020-10-25 LAB — BASIC METABOLIC PANEL
Anion gap: 12 (ref 5–15)
BUN: 24 mg/dL — ABNORMAL HIGH (ref 6–20)
CO2: 28 mmol/L (ref 22–32)
Calcium: 8.8 mg/dL — ABNORMAL LOW (ref 8.9–10.3)
Chloride: 95 mmol/L — ABNORMAL LOW (ref 98–111)
Creatinine, Ser: 0.98 mg/dL (ref 0.61–1.24)
GFR, Estimated: >60 mL/min (ref >60)
Glucose, Bld: 337 mg/dL — ABNORMAL HIGH (ref 70–99)
Potassium: 3.6 mmol/L (ref 3.5–5.1)
Sodium: 135 mmol/L (ref 135–145)

## 2020-10-25 LAB — BRAIN NATRIURETIC PEPTIDE: B Natriuretic Peptide: 146 pg/mL — ABNORMAL HIGH (ref 0.0–100.0)

## 2020-10-25 IMAGING — DX DG CHEST 1V
1 series · 1 of 1 positions shown · non-contrast
Comparison: [DATE]

CLINICAL DATA: Shortness of breath and leg swelling

EXAM:
CHEST  1 VIEW

[chest ap]
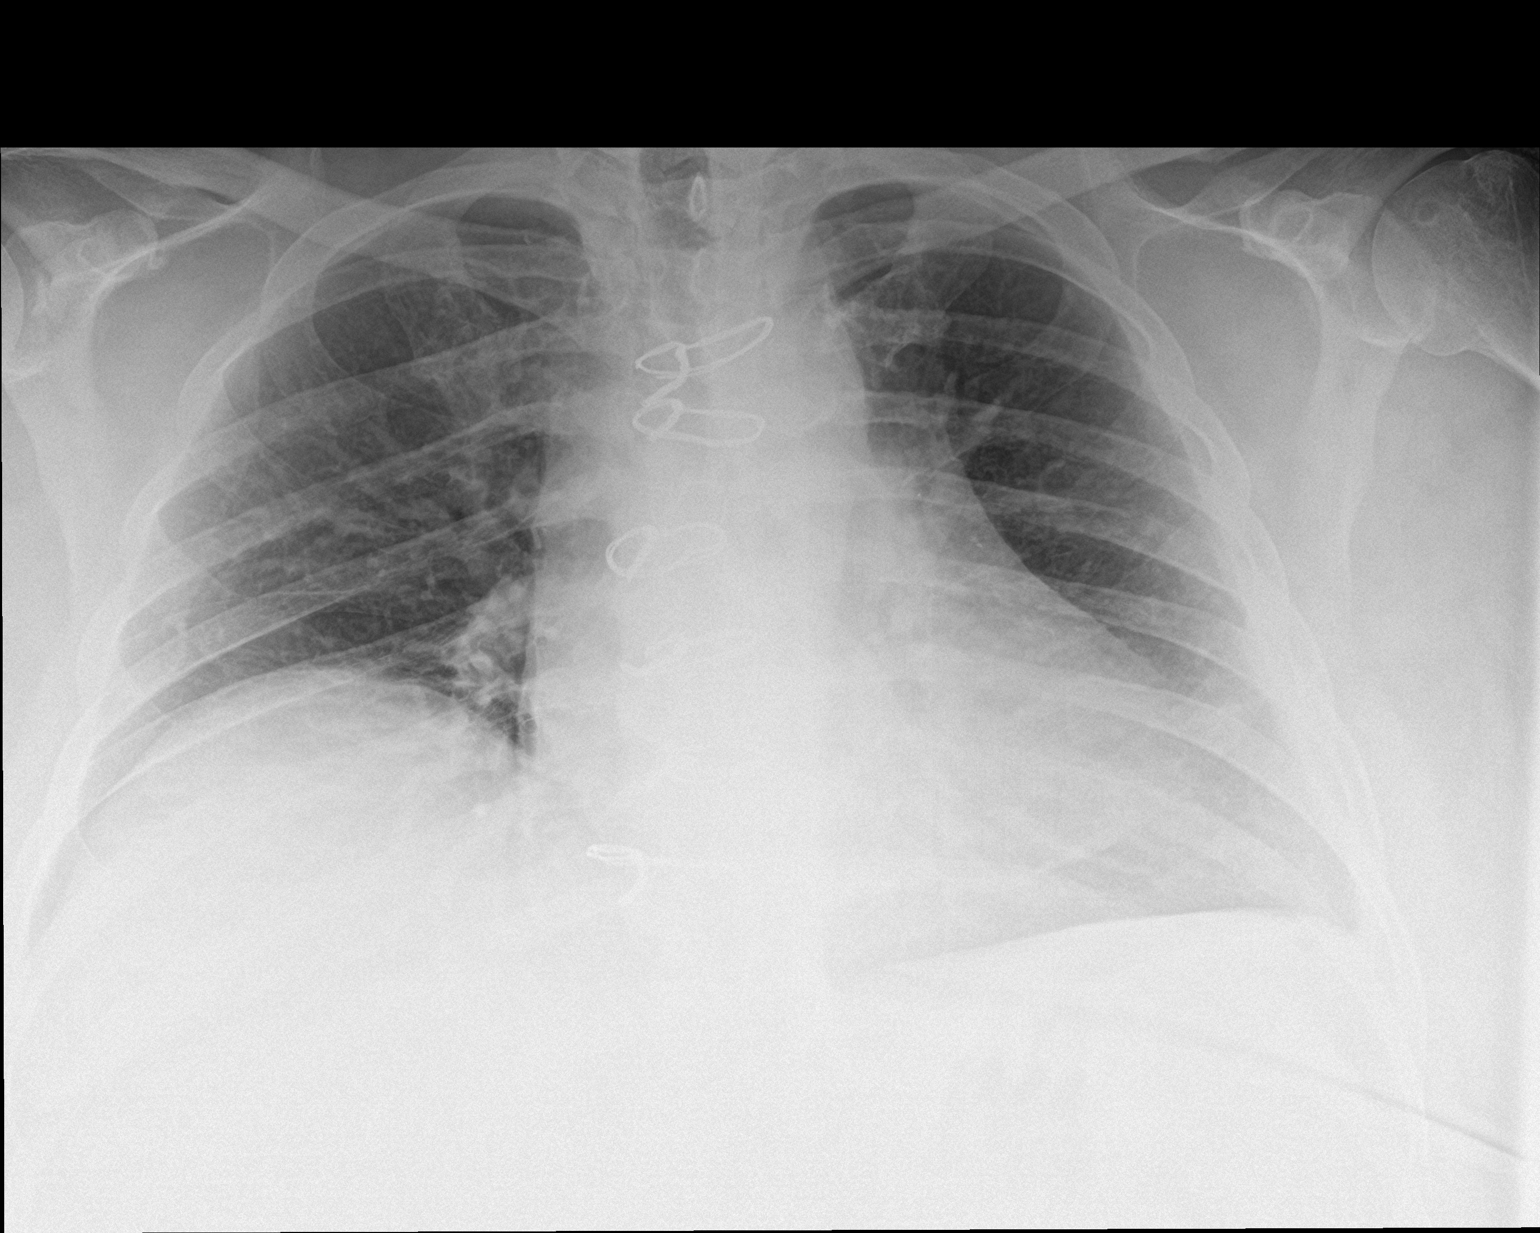

[1 of 1 positions shown; findings below may reference images not displayed]

FINDINGS: Post sternotomy changes. Similar elevation of right diaphragm. Mild
cardiomegaly with aortic atherosclerosis. No pneumothorax.
IMPRESSION: Cardiomegaly.

## 2020-10-25 MED ORDER — TORSEMIDE 20 MG PO TABS
20.0000 mg | ORAL_TABLET | Freq: Every day | ORAL | Status: DC
  Administered 2020-10-25: 20 mg via ORAL
  Filled 2020-10-25 (×3): qty 1

## 2020-10-25 MED ORDER — MUPIROCIN CALCIUM 2 % EX CREA: 1.0000 "application " | TOPICAL_CREAM | Freq: Two times a day (BID) | CUTANEOUS | 0 refills | Status: DC

## 2020-10-25 MED ORDER — MUPIROCIN CALCIUM 2 % EX CREA
TOPICAL_CREAM | Freq: Two times a day (BID) | CUTANEOUS | Status: DC
  Filled 2020-10-25: qty 15

## 2020-10-25 MED ORDER — SULFAMETHOXAZOLE-TRIMETHOPRIM 800-160 MG PO TABS: 1.0000 | ORAL_TABLET | Freq: Two times a day (BID) | ORAL | 0 refills | Status: AC

## 2020-10-25 MED ORDER — SULFAMETHOXAZOLE-TRIMETHOPRIM 800-160 MG PO TABS
1.0000 | ORAL_TABLET | Freq: Once | ORAL | Status: AC
  Administered 2020-10-25: 1 via ORAL
  Filled 2020-10-25: qty 1

## 2020-10-25 NOTE — ED Notes (Signed)
AC called for bactroban and demadex.

## 2020-10-25 NOTE — ED Provider Notes (Addendum)
[facility] EMERGENCY DEPARTMENT Provider Note   CSN: [id number] Arrival date & time: 10/25/20  1558     History Chief Complaint  Patient presents with  . Leg Swelling    Shane Chambers is a 59 y.o. male with PMH significant for grade 2 diastolic CHF with preserved EF, HTN, HLD, type II DM, OSA, and CAD s/p CABG 03/29/2020 who presents to the ED with complaints of swelling and redness involving bilateral lower extremities.  I reviewed patient's medical record and he is followed by [facility] who recommended torsemide 40 mg twice daily, 60 cc fluid restriction daily, restrict sodium intake, and daily weights.  Patient's estimated dry weight is 280.    On my examination, patient states that he was evaluated on 10/15/2020 for similar symptoms and discharged home with doxycycline for treatment of cellulitis.  There were also numerous excoriations and lesions noted during prior encounter.  Patient was nontoxic-appearing and ultimately discharged home with oral antibiotics and close outpatient follow-up.  Importance of daily diuretic was discussed.    He tells me that his symptoms have been getting progressively worse despite the oral doxycycline.  He states that his affected areas are more red and more painful.  He is concerned that the cellulitis has progressed.  He states that the lesions are often draining a mixed bloody/purulent discharge.  He is also noticed worsening overlying golden crusting.  While he has not documented any fevers at home, he states that he is taking combined ibuprofen/Tylenol for treatment of his pain and suspects that his fevers are masked.  Patient states that he has been taking all of his medications, as directed.  He has continued to try and limit his fluid intake and avoid high salt diet.  He did not check his weights today and could not tell me exactly how much he weighed yesterday.  He does endorse mildly worsening dyspnea, particularly with exertion.  He  denies any cough or chest pain.     HPI     Past Medical History:  Diagnosis Date  . CAD (coronary artery disease)    a. s/p CABG in 03/2020 with LIMA-LAD, RIMA-PL, RA-D1-RI-OM1  . Depression   . Diabetes mellitus without complication ([place])   . Hyperlipidemia 12/01/2019  . Hypertension   . Ischemic cardiomyopathy    a. EF 20-25% by echo in 02/2020 b. at 40% by echo in 03/2020 c. EF normalized to 60-65% by echo in 04/2020  . Type 2 diabetes mellitus [facility])     Patient Active Problem List   Diagnosis Date Noted  . CHF exacerbation ([place]) 08/01/2020  . Visit for wound check 05/28/2020  . OSA (obstructive sleep apnea) 05/24/2020  . Acute exacerbation of chronic obstructive pulmonary disease (COPD) ([place]) 04/29/2020  . Leukocytosis 04/29/2020  . 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 Ischemic cardiomyopathy   . Coronary artery disease   . Acute on chronic combined systolic and diastolic CHF (congestive heart failure) ([place]) 03/05/2020  . Acute hyponatremia 03/05/2020  . Severe Vitamin D deficiency 12/02/2019  . Hypokalemia 12/01/2019  . Hyperlipidemia 12/01/2019  . BPH (benign prostatic hyperplasia) 12/01/2019  . Normocytic anemia 12/01/2019  . Pneumonia due to COVID-19 virus 12/01/2019  . Hypoxia   . SOB (shortness of breath) 11/16/2019  . Hypertension   . Type 2 diabetes mellitus ([place])   . Depression   . Acute bronchitis with bronchospasm 11/15/2019    Past Surgical History:  Procedure Laterality Date  . CORONARY ARTERY  BYPASS GRAFT N/A 03/13/2020   Procedure: CORONARY ARTERY BYPASS GRAFTING (CABG) times five using bilateral Internal mammary arteries and left radial artery;  Surgeon: [name], MD;  Location: [place];  Service: Open Heart Surgery;  Laterality: N/A;  . DENTAL SURGERY    . RADIAL ARTERY HARVEST Left 03/13/2020   Procedure: RADIAL ARTERY HARVEST,;  Surgeon: [name], MD;  Location: [place];  Service: Open Heart Surgery;  Laterality: Left;  . RIGHT/LEFT HEART  CATH AND CORONARY ANGIOGRAPHY N/A 03/07/2020   Procedure: RIGHT/LEFT HEART CATH AND CORONARY ANGIOGRAPHY;  Surgeon: Sherren Mocha, MD;  Location: [place] CV LAB;  Service: Cardiovascular;  Laterality: N/A;  . TEE WITHOUT CARDIOVERSION N/A 03/13/2020   Procedure: TRANSESOPHAGEAL ECHOCARDIOGRAM (TEE);  Surgeon: [name], MD;  Location: [place];  Service: Open Heart Surgery;  Laterality: N/A;       Family History  Problem Relation Age of Onset  . Hypertension Mother     Social History   Tobacco Use  . Smoking status: Former Smoker    Packs/day: 1.50    Types: Cigarettes    Quit date: 05/25/1995    Years since quitting: 25.4  . Smokeless tobacco: Never Used  Vaping Use  . Vaping Use: Never used  Substance Use Topics  . Alcohol use: Yes    Comment: rarely  . Drug use: No    Home Medications Prior to Admission medications   Medication Sig Start Date End Date Taking? Authorizing Provider  albuterol (VENTOLIN HFA) 108 (90 Base) MCG/ACT inhaler Inhale 1-2 puffs into the lungs every 6 (six) hours as needed for wheezing or shortness of breath. 05/24/20  Yes Rigoberto Noel, MD  aspirin 81 MG EC tablet Take 81 mg by mouth daily.  03/10/16  Yes [provider]  Aspirin Effervescent (ALKA-SELTZER ORIGINAL PO) Take 1 tablet by mouth daily as needed.   Yes [provider]  atorvastatin (LIPITOR) 80 MG tablet Take 80 mg by mouth daily.   Yes [provider]  budesonide-formoterol (SYMBICORT) 160-4.5 MCG/ACT inhaler Inhale 2 puffs into the lungs in the morning and at bedtime. 05/24/20  Yes Rigoberto Noel, MD  calcium carbonate (OS-CAL - DOSED IN MG OF ELEMENTAL CALCIUM) 1250 (500 Ca) MG tablet Take 1 tablet by mouth daily.   Yes [provider]  clopidogrel (PLAVIX) 75 MG tablet Take 1 tablet (75 mg total) by mouth daily. 08/21/20  Yes Verta Ellen., NP  fluticasone (VERAMYST) 27.5 MCG/SPRAY nasal spray Place 2 sprays into the nose daily as needed  for rhinitis or allergies.    Yes [provider]  gabapentin (NEURONTIN) 300 MG capsule Take 900 mg by mouth 2 (two) times daily.   Yes [provider]  Ibuprofen-Acetaminophen (ADVIL DUAL ACTION PO) Take 1 tablet by mouth daily as needed.   Yes [provider]  insulin aspart protamine- aspart (NOVOLOG MIX 70/30) (70-30) 100 UNIT/ML injection Inject 0.55 mLs (55 Units total) into the skin 2 (two) times daily with a meal. 05/03/20  Yes Shahmehdi, Seyed A, MD  ketoconazole (NIZORAL) 2 % cream Apply 1 application topically daily. Apply twice daily for 2 weeks to the ears 06/04/20  Yes Gekas, Jesse Fall, PA-C  losartan (COZAAR) 25 MG tablet Take 0.5 tablets (12.5 mg total) by mouth daily. 08/21/20  Yes Verta Ellen., NP  metFORMIN (GLUCOPHAGE) 1000 MG tablet Take 1,000 mg by mouth 2 (two) times daily with a meal.   Yes [provider]  metoprolol succinate (TOPROL-XL) 25 MG 24 hr tablet Take 0.5 tablets (12.5 mg total) by mouth 2 (two) times daily. 08/21/20  Yes Verta Ellen., NP  Multiple Vitamins-Minerals (TAB-A-VITE) TABS Take 1 tablet by mouth daily. 07/27/19  Yes [provider]  omeprazole (PRILOSEC) 40 MG capsule Take 40 mg by mouth daily.   Yes [provider]  Phenyleph-Doxylamine-DM-APAP (ALKA-SELTZER PLS ALLERGY & CGH PO) Take 1 tablet by mouth daily as needed.   Yes [provider]  potassium chloride SA (KLOR-CON) 20 MEQ tablet Take 1 tablet (20 mEq total) by mouth daily. 08/21/20  Yes Verta Ellen., NP  tamsulosin (FLOMAX) 0.4 MG CAPS capsule Take 0.4 mg by mouth daily.   Yes [provider]  torsemide (DEMADEX) 20 MG tablet Take 2 tablets (40 mg total) by mouth 2 (two) times daily. May take an extra pill for 3 lbs weight gain in 24 hours or 5 lbs in one week 10/10/20 11/09/20 Yes Branch, Alphonse Guild, MD  triamcinolone (KENALOG) 0.1 % Apply 1 application topically 3 (three) times daily. 10/15/20  Yes  Triplett, Tammy, PA-C  venlafaxine XR (EFFEXOR-XR) 150 MG 24 hr capsule Take 150 mg by mouth daily with breakfast.   Yes [provider]  docusate sodium (COLACE) 100 MG capsule Take 1 capsule (100 mg total) by mouth 2 (two) times daily. Patient not taking: Reported on 10/25/2020 12/25/19   Heath Lark D, DO  doxycycline (VIBRAMYCIN) 100 MG capsule Take 1 capsule (100 mg total) by mouth 2 (two) times daily. Patient not taking: Reported on 10/25/2020 10/15/20   Kem Parkinson, PA-C  ferrous sulfate 325 (65 FE) MG tablet Take 1 tablet (325 mg total) by mouth every other day. Patient not taking: Reported on 10/25/2020 05/03/20   Deatra James, MD  guaiFENesin-dextromethorphan (ROBITUSSIN DM) 100-10 MG/5ML syrup Take 5 mLs by mouth every 4 (four) hours as needed for cough. 05/03/20   Shahmehdi, Valeria Batman, MD  insulin aspart (NOVOLOG) 100 UNIT/ML injection Inject 0-20 Units into the skin 3 (three) times daily with meals. Patient not taking: Reported on 10/25/2020 05/03/20   Deatra James, MD  mupirocin cream (BACTROBAN) 2 % Apply 1 application topically 2 (two) times daily. 10/25/20   Corena Herter, PA-C  polyethylene glycol (MIRALAX / GLYCOLAX) 17 g packet Take 17 g by mouth daily as needed for mild constipation. Patient not taking: Reported on 10/25/2020 12/25/19   Heath Lark D, DO  sulfamethoxazole-trimethoprim (BACTRIM DS) 800-160 MG tablet Take 1 tablet by mouth 2 (two) times daily for 7 days. 10/25/20 11/01/20  Corena Herter, PA-C  traMADol (ULTRAM) 50 MG tablet Take 1-2 tablets (50-100 mg total) by mouth every 4 (four) hours as needed for moderate pain. Patient not taking: Reported on 10/25/2020 03/19/20   Barrett, Lodema Hong, PA-C    Allergies    Ace inhibitors, Other, and Penicillins  Review of Systems   Review of Systems  All other systems reviewed and are negative.   Physical Exam Updated Vital Signs BP (!) 152/83   Pulse 91   Temp 98.2 F (36.8 C) (Oral)   Resp  18   SpO2 99%   Physical Exam Vitals and nursing note reviewed. Exam conducted with a chaperone present.  HENT:     Head: Normocephalic and atraumatic.  Eyes:     General: No scleral icterus.    Conjunctiva/sclera: Conjunctivae normal.  Cardiovascular:     Rate and Rhythm: Normal rate  and regular rhythm.     Pulses: Normal pulses.  Pulmonary:     Comments: Mildly increased work of breathing. Mild tachypnea.  No respiratory distress.  Musculoskeletal:     Right lower leg: Edema present.     Left lower leg: Edema present.     Comments: ROM intact throughout.  Pedal pulse intact and symmetric bilaterally.  Sensation mildly diminished on plantar aspect of feet (chronic).  Skin:    General: Skin is dry.     Capillary Refill: Capillary refill takes less than 2 seconds.     Comments: Multiple excoriations and lesions, mixed macular and papular, over lower extremities bilaterally.  Suspect element of early stasis dermatitis.  Superimposed cellulitis, however no significant induration noted.  No areas of fluctuance concerning for abscess.  There is however a golden crusting appearance over some of the lesions, suspect due to secondary bacterial infection in context of repeated scratching.  No significant changes when compared to images obtained 10 days ago.  Neurological:     General: No focal deficit present.     Mental Status: He is alert and oriented to person, place, and time.     GCS: GCS eye subscore is 4. GCS verbal subscore is 5. GCS motor subscore is 6.  Psychiatric:        Mood and Affect: Mood normal.        Behavior: Behavior normal.        Thought Content: Thought content normal.           ED Results / Procedures / Treatments   Labs (all labs ordered are listed, but only abnormal results are displayed) Labs Reviewed  CBC WITH DIFFERENTIAL/PLATELET - Abnormal; Notable for the following components:      Result Value   WBC 12.5 (*)    Hemoglobin 11.6 (*)    HCT 36.5  (*)    Neutro Abs 9.0 (*)    Abs Immature Granulocytes 0.24 (*)    All other components within normal limits  BASIC METABOLIC PANEL - Abnormal; Notable for the following components:   Chloride 95 (*)    Glucose, Bld 337 (*)    BUN 24 (*)    Calcium 8.8 (*)    All other components within normal limits  BRAIN NATRIURETIC PEPTIDE - Abnormal; Notable for the following components:   B Natriuretic Peptide 146.0 (*)    All other components within normal limits    EKG EKG Interpretation  Date/Time:  Thursday October 25 2020 18:53:15 EST Ventricular Rate:  98 PR Interval:  178 QRS Duration: 96 QT Interval:  390 QTC Calculation: 497 R Axis:   9 Text Interpretation: Normal sinus rhythm Possible Inferior infarct , age undetermined Abnormal ECG No STEMI Confirmed by Octaviano Glow [phone]) on 10/25/2020 7:18:44 PM   Radiology DG Chest 1 View  Result Date: 10/25/2020 CLINICAL DATA:  Shortness of breath and leg swelling EXAM: CHEST  1 VIEW COMPARISON:  10/15/2020 FINDINGS: Post sternotomy changes. Similar elevation of right diaphragm. Mild cardiomegaly with aortic atherosclerosis. No pneumothorax. IMPRESSION: Cardiomegaly. Electronically Signed   By: Donavan Foil M.D.   On: 10/25/2020 18:35    Procedures Procedures (including critical care time)  Medications Ordered in ED Medications  mupirocin cream (BACTROBAN) 2 % (has no administration in time range)  sulfamethoxazole-trimethoprim (BACTRIM DS) 800-160 MG per tablet 1 tablet (has no administration in time range)  torsemide (DEMADEX) tablet 20 mg (has no administration in time range)    ED Course  I have reviewed the triage vital signs and the nursing notes.  Pertinent labs & imaging results that were available during my care of the patient were reviewed by me and considered in my medical decision making (see chart for details).    MDM Rules/Calculators/A&P                          Patient is presented to the ED for  persistent bilateral lower extremity swelling, redness, and discomfort.  He had taken his outpatient doxycycline, as directed.  Labs CBC with differential: Mild leukocytosis 12.5, improved when compared to labs obtained 10 days ago. BMP: Hyperglycemia to 337, but otherwise unremarkable.  No high anion gap metabolic acidosis. BNP: Only mild elevation to 146.  Relatively consistent with baseline.  DG chest is personally reviewed and demonstrates cardiomegaly without any evidence of pulmonary edema or other acute cardiopulmonary disease.  I compared to pictures obtained today with pictures obtained on 10/15/2020 during patient's initial ED encounter for cellulitis.  There does not appear to be any significant deterioration or worsening of his clinical presentation.  There does appear to be overlying golden crusting concerning for impetiginous rash.  Given that pedal pulses intact and symmetric bilaterally and feet are warm, lower suspicion for arterial insufficiency or advanced PVD as cause for ulcerative appearing lesions.  I do suspect there is a component of venous insufficiency involved.  Discussed findings with patient.  We will place him on mupirocin topically as well as oral clindamycin.  However, he insisted that clindamycin has been poorly tolerated in the past.  Requesting separate antibiotic.  I feel as though Bactrim is a reasonable alternative.  I also encouraged him to try p.o. and topical Benadryl at night to help curb his pruritus and excoriations.  ED return precautions discussed.  Patient voices understanding and is agreeable to the plan.  Final Clinical Impression(s) / ED Diagnoses Final diagnoses:  Impetigo  Venous insufficiency    Rx / DC Orders ED Discharge Orders         Ordered    sulfamethoxazole-trimethoprim (BACTRIM DS) 800-160 MG tablet  2 times daily        10/25/20 2122    mupirocin cream (BACTROBAN) 2 %  2 times daily        10/25/20 2122           Corena Herter, PA-C 10/25/20 2123    Corena Herter, PA-C 10/25/20 2136    Wyvonnia Dusky, MD 10/26/20 1128

## 2020-10-25 NOTE — ED Notes (Signed)
Pt presents with BLE edema with lesions and open sores up to the thigh. Pt states he was seen here on 10/15/20 and given a rx for doxy 150mg  which he states he finished last night but his legs has gotten worse. Pt states aching pain in both legs at 7/10

## 2020-10-25 NOTE — Discharge Instructions (Signed)
Your exam and work-up today was largely reassuring.  Please continue with your medications, as directed.  I have also prescribed a short course of Bactrim and mupirocin.  Please take, as prescribed.  I would also like for you to apply over-the-counter Benadryl cream to the affected areas and take p.o. Benadryl to help curb your itching and scratching sensations.  You will need to follow-up with your primary care provider for ongoing evaluation and management of your skin lesions.

## 2020-10-25 NOTE — ED Triage Notes (Signed)
Bilateral leg swelling with redness

## 2020-11-05 ENCOUNTER — Emergency Department (HOSPITAL_COMMUNITY): Payer: Medicaid Other

## 2020-11-05 ENCOUNTER — Other Ambulatory Visit: Payer: Self-pay

## 2020-11-05 ENCOUNTER — Encounter (HOSPITAL_COMMUNITY): Payer: Self-pay | Admitting: Emergency Medicine

## 2020-11-05 ENCOUNTER — Emergency Department (HOSPITAL_COMMUNITY)
Admission: EM | Admit: 2020-11-05 | Discharge: 2020-11-05 | Disposition: A | Discharge status: Home / Self Care | Payer: Medicaid Other | Attending: Emergency Medicine | Admitting: Emergency Medicine

## 2020-11-05 DIAGNOSIS — Z79899 Other long term (current) drug therapy: Secondary | ICD-10-CM | POA: Diagnosis not present

## 2020-11-05 DIAGNOSIS — E119 Type 2 diabetes mellitus without complications: Secondary | ICD-10-CM | POA: Insufficient documentation

## 2020-11-05 DIAGNOSIS — Z7984 Long term (current) use of oral hypoglycemic drugs: Secondary | ICD-10-CM | POA: Diagnosis not present

## 2020-11-05 DIAGNOSIS — I5043 Acute on chronic combined systolic (congestive) and diastolic (congestive) heart failure: Secondary | ICD-10-CM | POA: Insufficient documentation

## 2020-11-05 DIAGNOSIS — Z794 Long term (current) use of insulin: Secondary | ICD-10-CM | POA: Insufficient documentation

## 2020-11-05 DIAGNOSIS — Z7982 Long term (current) use of aspirin: Secondary | ICD-10-CM | POA: Insufficient documentation

## 2020-11-05 DIAGNOSIS — Z951 Presence of aortocoronary bypass graft: Secondary | ICD-10-CM | POA: Insufficient documentation

## 2020-11-05 DIAGNOSIS — I110 Hypertensive heart disease with heart failure: Secondary | ICD-10-CM | POA: Diagnosis not present

## 2020-11-05 DIAGNOSIS — Z8616 Personal history of COVID-19: Secondary | ICD-10-CM | POA: Insufficient documentation

## 2020-11-05 DIAGNOSIS — Z87891 Personal history of nicotine dependence: Secondary | ICD-10-CM | POA: Insufficient documentation

## 2020-11-05 DIAGNOSIS — L139 Bullous disorder, unspecified: Secondary | ICD-10-CM | POA: Insufficient documentation

## 2020-11-05 DIAGNOSIS — J441 Chronic obstructive pulmonary disease with (acute) exacerbation: Secondary | ICD-10-CM | POA: Insufficient documentation

## 2020-11-05 DIAGNOSIS — M79671 Pain in right foot: Secondary | ICD-10-CM

## 2020-11-05 DIAGNOSIS — I2510 Atherosclerotic heart disease of native coronary artery without angina pectoris: Secondary | ICD-10-CM | POA: Insufficient documentation

## 2020-11-05 DIAGNOSIS — R238 Other skin changes: Secondary | ICD-10-CM

## 2020-11-05 LAB — CBC WITH DIFFERENTIAL/PLATELET
Abs Immature Granulocytes: 0.21 10*3/uL — ABNORMAL HIGH (ref 0.00–0.07)
Basophils Absolute: 0.1 10*3/uL (ref 0.0–0.1)
Basophils Relative: 1 %
Eosinophils Absolute: 0.6 10*3/uL — ABNORMAL HIGH (ref 0.0–0.5)
Eosinophils Relative: 5 %
HCT: 34 % — ABNORMAL LOW (ref 39.0–52.0)
Hemoglobin: 10.7 g/dL — ABNORMAL LOW (ref 13.0–17.0)
Immature Granulocytes: 2 %
Lymphocytes Relative: 15 %
Lymphs Abs: 1.9 10*3/uL (ref 0.7–4.0)
MCH: 27 pg (ref 26.0–34.0)
MCHC: 31.5 g/dL (ref 30.0–36.0)
MCV: 85.9 fL (ref 80.0–100.0)
Monocytes Absolute: 1.2 10*3/uL — ABNORMAL HIGH (ref 0.1–1.0)
Monocytes Relative: 9 %
Neutro Abs: 8.6 10*3/uL — ABNORMAL HIGH (ref 1.7–7.7)
Neutrophils Relative %: 68 %
Platelets: 360 10*3/uL (ref 150–400)
RBC: 3.96 MIL/uL — ABNORMAL LOW (ref 4.22–5.81)
RDW: 13 % (ref 11.5–15.5)
WBC: 12.5 10*3/uL — ABNORMAL HIGH (ref 4.0–10.5)
nRBC: 0 % (ref 0.0–0.2)

## 2020-11-05 LAB — BASIC METABOLIC PANEL
Anion gap: 9 (ref 5–15)
BUN: 25 mg/dL — ABNORMAL HIGH (ref 6–20)
CO2: 29 mmol/L (ref 22–32)
Calcium: 9.7 mg/dL (ref 8.9–10.3)
Chloride: 95 mmol/L — ABNORMAL LOW (ref 98–111)
Creatinine, Ser: 1.21 mg/dL (ref 0.61–1.24)
GFR, Estimated: >60 mL/min (ref >60)
Glucose, Bld: 244 mg/dL — ABNORMAL HIGH (ref 70–99)
Potassium: 4.2 mmol/L (ref 3.5–5.1)
Sodium: 133 mmol/L — ABNORMAL LOW (ref 135–145)

## 2020-11-05 LAB — CBG MONITORING, ED: Glucose-Capillary: 381 mg/dL — ABNORMAL HIGH (ref 70–99)

## 2020-11-05 IMAGING — DX DG FOOT COMPLETE 3+V*R*
3 series · 3 of 3 positions shown · non-contrast
Comparison: None.

CLINICAL DATA: Pain, blister.

EXAM:
RIGHT FOOT COMPLETE - 3+ VIEW

[foot ap]
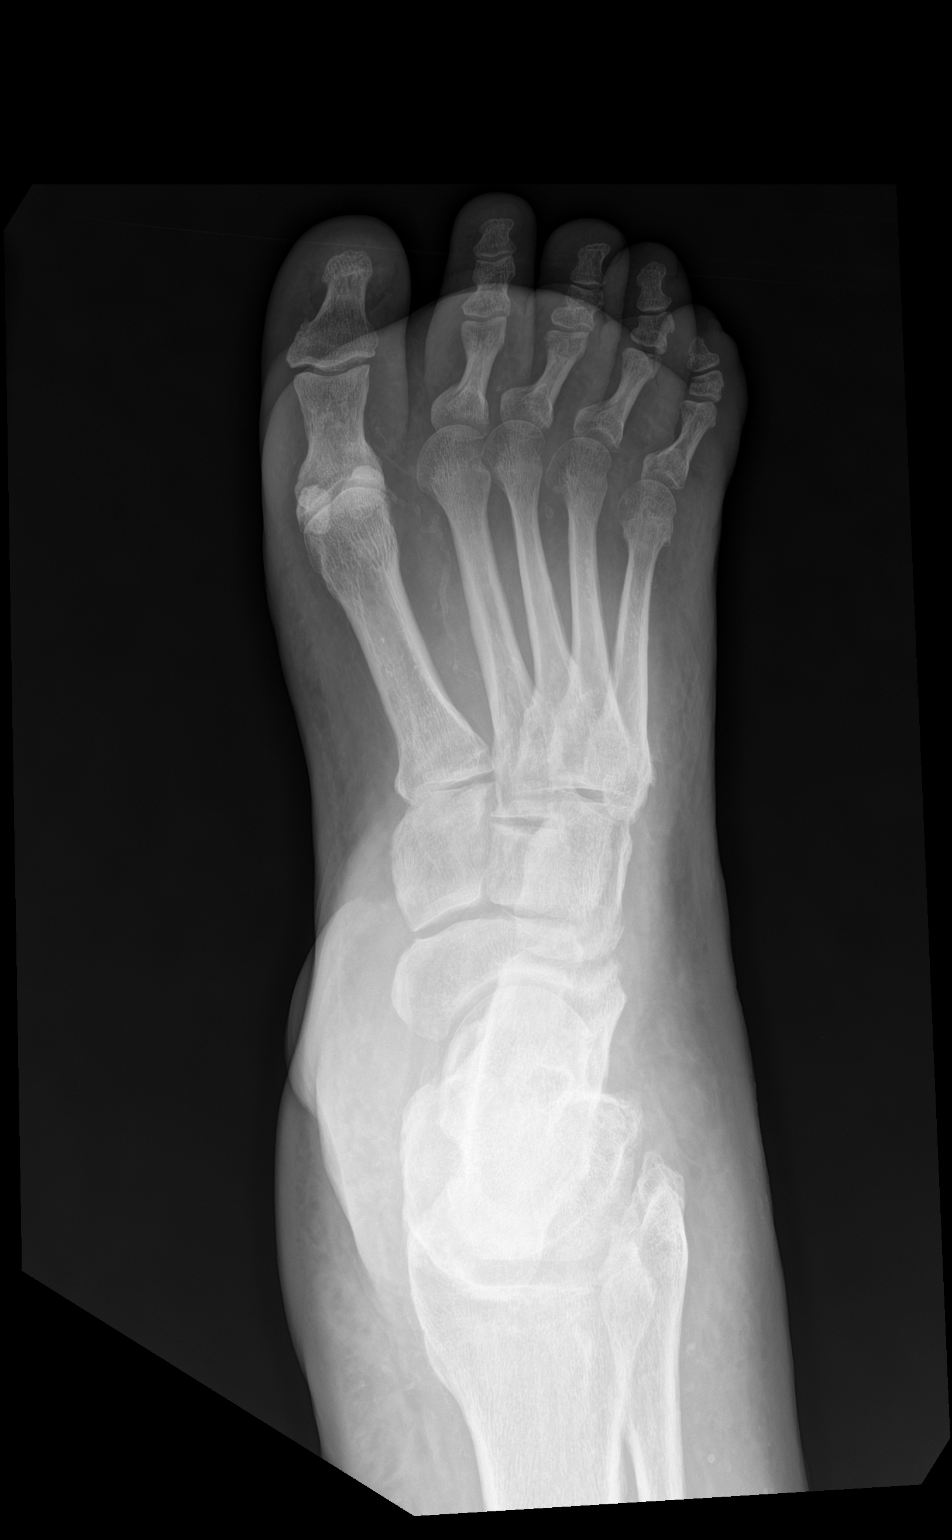

[foot obl]
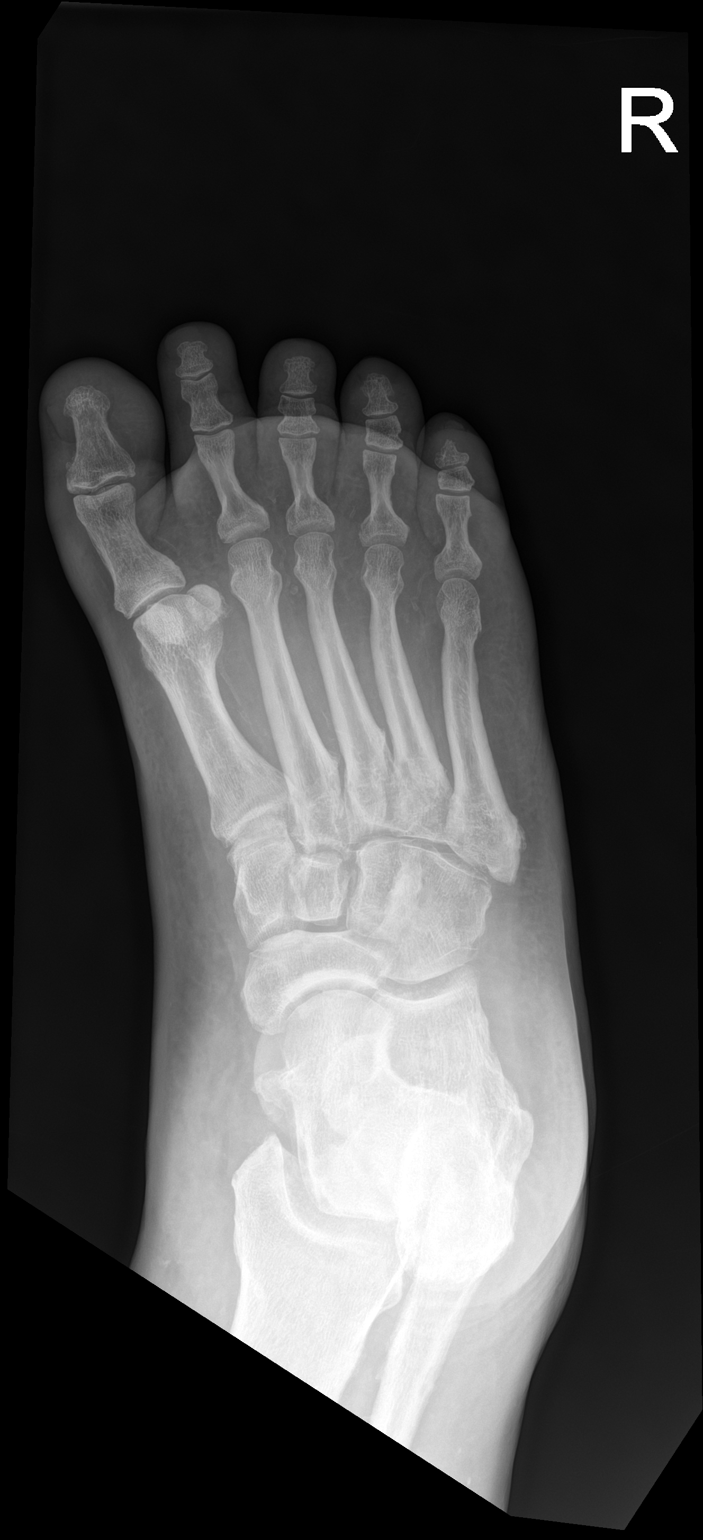

[foot lat]
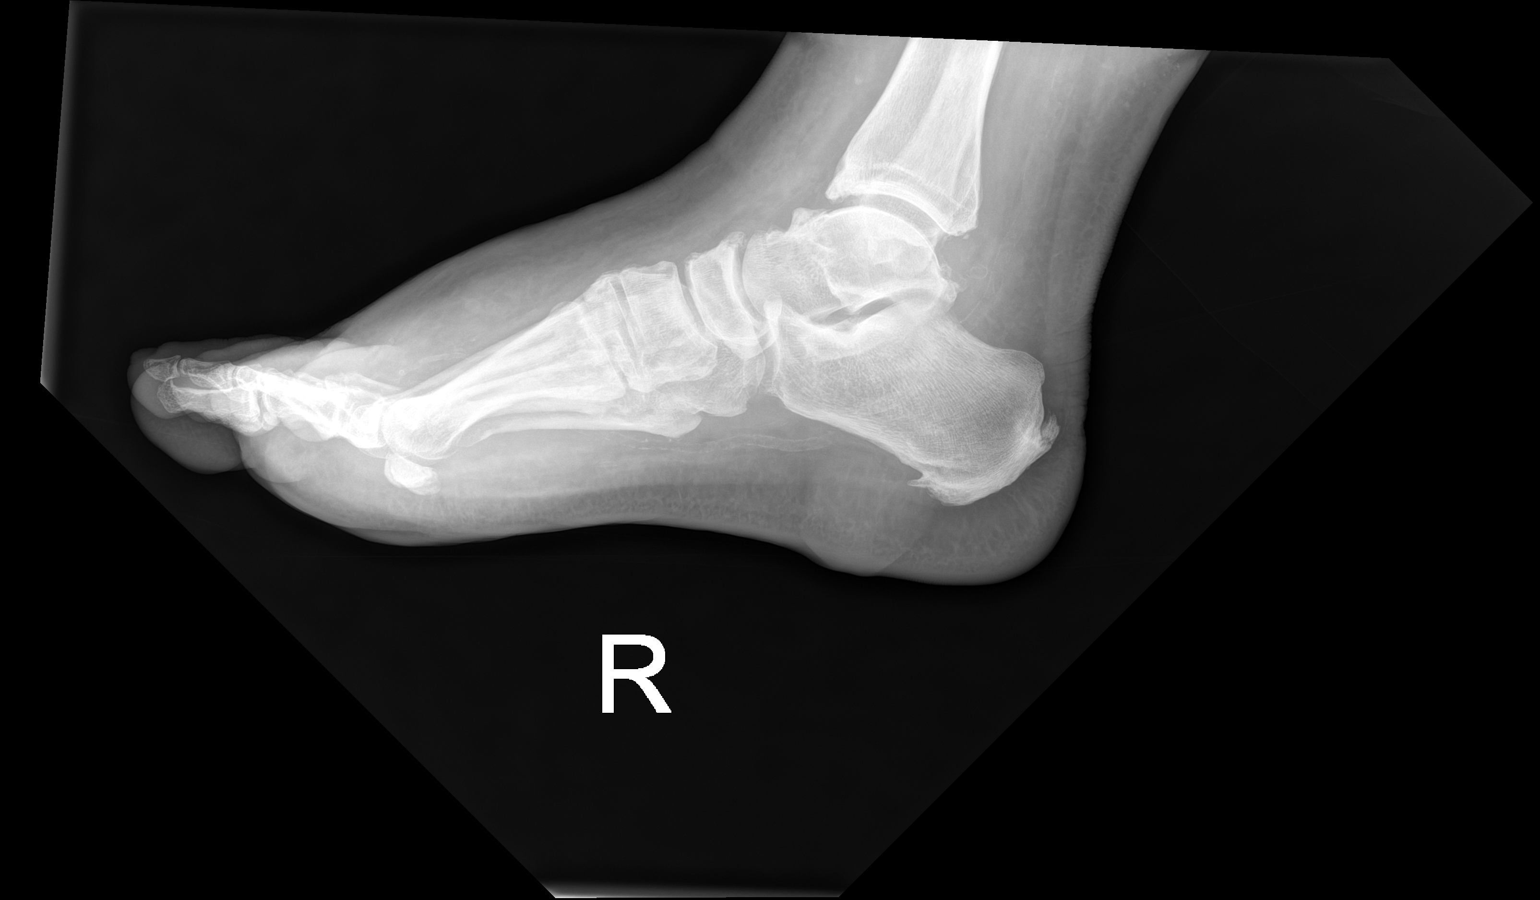

[3 of 3 positions shown; findings below may reference images not displayed]

FINDINGS: There is no evidence of fracture or dislocation. Slight hammertoe
deformity of the digits. No erosion or bony destruction.
Degenerative change in the mid and hindfoot most prominent at the
talonavicular joint. Plantar calcaneal spur and Achilles tendon
enthesophyte. Generalized soft tissue edema, prominent over the
dorsum of the foot. There is also a rounded soft tissue density
projecting over the plantar aspect of the calcaneus, indeterminate.
No soft tissue air. No radiopaque foreign body. There are vascular
calcifications.
IMPRESSION: 1. Generalized soft tissue edema. No acute osseous abnormality.
2. Rounded soft tissue density projecting over the plantar aspect of
the calcaneus, indeterminate, may represent site of reported
blister. Recommend correlation with physical exam.
3. Degenerative change in the mid and hindfoot. Plantar calcaneal
spur and Achilles tendon enthesophyte.

## 2020-11-05 MED ORDER — SULFAMETHOXAZOLE-TRIMETHOPRIM 800-160 MG PO TABS
1.0000 | ORAL_TABLET | Freq: Once | ORAL | Status: AC
  Administered 2020-11-05: 1 via ORAL
  Filled 2020-11-05: qty 1

## 2020-11-05 MED ORDER — SULFAMETHOXAZOLE-TRIMETHOPRIM 800-160 MG PO TABS: 1.0000 | ORAL_TABLET | Freq: Two times a day (BID) | ORAL | 0 refills | Status: AC

## 2020-11-05 NOTE — ED Notes (Signed)
Pt foot dressed. Wheeled out to lobby.

## 2020-11-05 NOTE — Discharge Instructions (Addendum)
Keep your foot clean and bandaged.  Start the antibiotic prescription tomorrow.  It is important that you call the podiatrist listed tomorrow morning to arrange a follow-up appointment for this week.  Also, contact your primary care provider regarding your elevated blood sugar.

## 2020-11-05 NOTE — ED Provider Notes (Signed)
[facility] EMERGENCY DEPARTMENT Provider Note   CSN: [id number] Arrival date & time: 11/05/20  1535     History Chief Complaint  Patient presents with  . Foot Pain    Shane Chambers is a 59 y.o. male.  HPI      Shane Chambers is a 59 y.o. male with past medical history of coronary artery disease, diabetes, hypertension and COPD who presents to the Emergency Department complaining of dark-colored blister to the right foot.  He states that he noticed a blister this morning.  Denies known injury or poorly fitting shoes.  He has been seen here several times this month for cellulitis of the bilateral lower extremities.  He has recently completed a course of doxycycline, ciprofloxacin and Bactrim for the infection of his legs.  He was noted to have multiple sores of his legs, but states the wounds seem to be improving.  He reports continued burning and itching of his lower extremities.  He denies fever, chills, chest pain or shortness of breath.    Past Medical History:  Diagnosis Date  . CAD (coronary artery disease)    a. s/p CABG in 03/2020 with LIMA-LAD, RIMA-PL, RA-D1-RI-OM1  . Depression   . Diabetes mellitus without complication ([place])   . Hyperlipidemia 12/01/2019  . Hypertension   . Ischemic cardiomyopathy    a. EF 20-25% by echo in 02/2020 b. at 40% by echo in 03/2020 c. EF normalized to 60-65% by echo in 04/2020  . Type 2 diabetes mellitus [facility])     Patient Active Problem List   Diagnosis Date Noted  . CHF exacerbation ([place]) 08/01/2020  . Visit for wound check 05/28/2020  . OSA (obstructive sleep apnea) 05/24/2020  . Acute exacerbation of chronic obstructive pulmonary disease (COPD) ([place]) 04/29/2020  . Leukocytosis 04/29/2020  . 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 Ischemic cardiomyopathy   . Coronary artery disease   . Acute on chronic combined systolic and diastolic CHF (congestive heart failure) ([place]) 03/05/2020  . Acute hyponatremia 03/05/2020  . Severe Vitamin D  deficiency 12/02/2019  . Hypokalemia 12/01/2019  . Hyperlipidemia 12/01/2019  . BPH (benign prostatic hyperplasia) 12/01/2019  . Normocytic anemia 12/01/2019  . Pneumonia due to COVID-19 virus 12/01/2019  . Hypoxia   . SOB (shortness of breath) 11/16/2019  . Hypertension   . Type 2 diabetes mellitus ([place])   . Depression   . Acute bronchitis with bronchospasm 11/15/2019    Past Surgical History:  Procedure Laterality Date  . CORONARY ARTERY BYPASS GRAFT N/A 03/13/2020   Procedure: CORONARY ARTERY BYPASS GRAFTING (CABG) times five using bilateral Internal mammary arteries and left radial artery;  Surgeon: [name], MD;  Location: [place];  Service: Open Heart Surgery;  Laterality: N/A;  . DENTAL SURGERY    . RADIAL ARTERY HARVEST Left 03/13/2020   Procedure: RADIAL ARTERY HARVEST,;  Surgeon: [name], MD;  Location: [place];  Service: Open Heart Surgery;  Laterality: Left;  . RIGHT/LEFT HEART CATH AND CORONARY ANGIOGRAPHY N/A 03/07/2020   Procedure: RIGHT/LEFT HEART CATH AND CORONARY ANGIOGRAPHY;  Surgeon: Sherren Mocha, MD;  Location: [place] [facility];  Service: Cardiovascular;  Laterality: N/A;  . TEE WITHOUT CARDIOVERSION N/A 03/13/2020   Procedure: TRANSESOPHAGEAL ECHOCARDIOGRAM (TEE);  Surgeon: [name], MD;  Location: [place];  Service: Open Heart Surgery;  Laterality: N/A;       Family History  Problem Relation Age of Onset  . Hypertension Mother  Social History   Tobacco Use  . Smoking status: Former Smoker    Packs/day: 1.50    Types: Cigarettes    Quit date: 05/25/1995    Years since quitting: 25.4  . Smokeless tobacco: Never Used  Vaping Use  . Vaping Use: Never used  Substance Use Topics  . Alcohol use: Yes    Comment: rarely  . Drug use: No    Home Medications Prior to Admission medications   Medication Sig Start Date End Date Taking? Authorizing Provider  albuterol (VENTOLIN HFA) 108 (90 Base) MCG/ACT inhaler Inhale 1-2 puffs into  the lungs every 6 (six) hours as needed for wheezing or shortness of breath. 05/24/20  Yes Rigoberto Noel, MD  aspirin 81 MG EC tablet Take 81 mg by mouth daily.  03/10/16  Yes [provider]  Aspirin Effervescent (ALKA-SELTZER ORIGINAL PO) Take 1 tablet by mouth daily as needed.   Yes [provider]  atorvastatin (LIPITOR) 80 MG tablet Take 80 mg by mouth daily.   Yes [provider]  budesonide-formoterol (SYMBICORT) 160-4.5 MCG/ACT inhaler Inhale 2 puffs into the lungs in the morning and at bedtime. 05/24/20  Yes Rigoberto Noel, MD  calcium carbonate (OS-CAL - DOSED IN MG OF ELEMENTAL CALCIUM) 1250 (500 Ca) MG tablet Take 1 tablet by mouth daily.   Yes [provider]  cholecalciferol (VITAMIN D3) 25 MCG (1000 UNIT) tablet Take 1,000 Units by mouth daily.   Yes [provider]  clopidogrel (PLAVIX) 75 MG tablet Take 1 tablet (75 mg total) by mouth daily. 08/21/20  Yes Verta Ellen., NP  fluticasone (VERAMYST) 27.5 MCG/SPRAY nasal spray Place 2 sprays into the nose daily as needed for rhinitis or allergies.    Yes [provider]  gabapentin (NEURONTIN) 300 MG capsule Take 900 mg by mouth 2 (two) times daily.   Yes [provider]  guaiFENesin-dextromethorphan (ROBITUSSIN DM) 100-10 MG/5ML syrup Take 5 mLs by mouth every 4 (four) hours as needed for cough. 05/03/20  Yes Shahmehdi, Seyed A, MD  Ibuprofen-Acetaminophen (ADVIL DUAL ACTION PO) Take 1 tablet by mouth daily as needed.   Yes [provider]  insulin aspart protamine- aspart (NOVOLOG MIX 70/30) (70-30) 100 UNIT/ML injection Inject 0.55 mLs (55 Units total) into the skin 2 (two) times daily with a meal. 05/03/20  Yes Shahmehdi, Seyed A, MD  losartan (COZAAR) 25 MG tablet Take 0.5 tablets (12.5 mg total) by mouth daily. 08/21/20  Yes Verta Ellen., NP  magnesium oxide (MAG-OX) 400 MG tablet Take 400 mg by mouth daily.   Yes [provider]  metFORMIN  (GLUCOPHAGE) 1000 MG tablet Take 1,000 mg by mouth 2 (two) times daily with a meal.   Yes [provider]  metoprolol succinate (TOPROL-XL) 25 MG 24 hr tablet Take 0.5 tablets (12.5 mg total) by mouth 2 (two) times daily. 08/21/20  Yes Verta Ellen., NP  Multiple Vitamins-Minerals (TAB-A-VITE) TABS Take 1 tablet by mouth daily. 07/27/19  Yes [provider]  omeprazole (PRILOSEC) 40 MG capsule Take 40 mg by mouth daily.   Yes [provider]  Phenyleph-Doxylamine-DM-APAP (ALKA-SELTZER PLS ALLERGY & CGH PO) Take 1 tablet by mouth daily as needed.   Yes [provider]  potassium chloride SA (KLOR-CON) 20 MEQ tablet Take 1 tablet (20 mEq total) by mouth daily. 08/21/20  Yes Verta Ellen., NP  tamsulosin (FLOMAX) 0.4 MG CAPS capsule Take 0.4 mg by mouth daily.  Yes [provider]  torsemide (DEMADEX) 20 MG tablet Take 2 tablets (40 mg total) by mouth 2 (two) times daily. May take an extra pill for 3 lbs weight gain in 24 hours or 5 lbs in one week 10/10/20 11/09/20 Yes Branch, Alphonse Guild, MD  venlafaxine XR (EFFEXOR-XR) 150 MG 24 hr capsule Take 150 mg by mouth daily with breakfast.   Yes [provider]  zinc gluconate 50 MG tablet Take 50 mg by mouth daily.   Yes [provider]  ciprofloxacin (CIPRO) 500 MG tablet Take 500 mg by mouth 2 (two) times daily. Patient not taking: Reported on 11/05/2020 10/25/20   [provider]  docusate sodium (COLACE) 100 MG capsule Take 1 capsule (100 mg total) by mouth 2 (two) times daily. Patient not taking: No sig reported 12/25/19   Heath Lark D, DO  doxycycline (VIBRAMYCIN) 100 MG capsule Take 1 capsule (100 mg total) by mouth 2 (two) times daily. Patient not taking: No sig reported 10/15/20   Korryn Pancoast, PA-C  ferrous sulfate 325 (65 FE) MG tablet Take 1 tablet (325 mg total) by mouth every other day. Patient not taking: Reported on 10/25/2020 05/03/20   Deatra James,  MD  insulin aspart (NOVOLOG) 100 UNIT/ML injection Inject 0-20 Units into the skin 3 (three) times daily with meals. Patient not taking: No sig reported 05/03/20   Deatra James, MD  ketoconazole (NIZORAL) 2 % cream Apply 1 application topically daily. Apply twice daily for 2 weeks to the ears Patient not taking: Reported on 11/05/2020 06/04/20   Recardo Evangelist, PA-C  mupirocin cream (BACTROBAN) 2 % Apply 1 application topically 2 (two) times daily. Patient not taking: No sig reported 10/25/20   Krista Blue L, PA-C  polyethylene glycol (MIRALAX / GLYCOLAX) 17 g packet Take 17 g by mouth daily as needed for mild constipation. Patient not taking: No sig reported 12/25/19   Manuella Ghazi, Pratik D, DO  traMADol (ULTRAM) 50 MG tablet Take 1-2 tablets (50-100 mg total) by mouth every 4 (four) hours as needed for moderate pain. Patient not taking: No sig reported 03/19/20   Barrett, Erin R, PA-C  triamcinolone (KENALOG) 0.1 % Apply 1 application topically 3 (three) times daily. Patient not taking: Reported on 11/05/2020 10/15/20   Kem Parkinson, PA-C    Allergies    Ace inhibitors, Other, and Penicillins  Review of Systems   Review of Systems  Constitutional: Negative for chills and fever.  Respiratory: Negative for chest tightness and shortness of breath.   Cardiovascular: Negative for chest pain.  Gastrointestinal: Negative for abdominal pain, nausea and vomiting.  Genitourinary: Negative for difficulty urinating and dysuria.  Musculoskeletal: Negative for arthralgias and joint swelling.  Skin: Positive for wound. Negative for color change.       Blister right foot  Neurological: Negative for dizziness, weakness and headaches.    Physical Exam Updated Vital Signs BP 126/64   Pulse 86   Temp 97.9 F (36.6 C) (Oral)   Resp 20   Ht 5\' 9"  (1.753 m)   Wt 129.7 kg   SpO2 97%   BMI 42.23 kg/m   Physical Exam Vitals and nursing note reviewed.  Constitutional:      General: He is  not in acute distress.    Appearance: He is well-developed and well-nourished. He is not toxic-appearing.  HENT:     Head: Atraumatic.  Cardiovascular:     Rate and Rhythm: Normal rate and regular rhythm.  Pulses: Normal pulses and intact distal pulses.  Pulmonary:     Effort: Pulmonary effort is normal.     Breath sounds: Normal breath sounds.  Abdominal:     Palpations: Abdomen is soft.     Tenderness: There is no abdominal tenderness.  Musculoskeletal:        General: Tenderness present.     Right lower leg: No edema.     Left lower leg: No edema.  Lymphadenopathy:     Cervical: No cervical adenopathy.  Skin:    General: Skin is warm.     Capillary Refill: Capillary refill takes less than 2 seconds.     Comments: Scattered, multiple chronic appearing wounds of the BLE's.  No lymphangitis.  Single, 6 cm bulla of the medial right foot that extends to the plantar surface.  Slight serosanguinous drainage present.  See attached photo  Neurological:     Mental Status: He is alert and oriented to person, place, and time.     Motor: No abnormal muscle tone.     Coordination: Coordination normal.       PT gave verbal consent to have digital image placed in medical record.    ED Results / Procedures / Treatments   Labs (all labs ordered are listed, but only abnormal results are displayed) Labs Reviewed  BASIC METABOLIC PANEL - Abnormal; Notable for the following components:      Result Value   Sodium 133 (*)    Chloride 95 (*)    Glucose, Bld 244 (*)    BUN 25 (*)    All other components within normal limits  CBC WITH DIFFERENTIAL/PLATELET - Abnormal; Notable for the following components:   WBC 12.5 (*)    RBC 3.96 (*)    Hemoglobin 10.7 (*)    HCT 34.0 (*)    Neutro Abs 8.6 (*)    Monocytes Absolute 1.2 (*)    Eosinophils Absolute 0.6 (*)    Abs Immature Granulocytes 0.21 (*)    All other components within normal limits  CBG MONITORING, ED - Abnormal; Notable for  the following components:   Glucose-Capillary 381 (*)    All other components within normal limits    EKG None  Radiology DG Foot Complete Right  Result Date: 11/05/2020 CLINICAL DATA:  Pain, blister. EXAM: RIGHT FOOT COMPLETE - 3+ VIEW COMPARISON:  None. FINDINGS: There is no evidence of fracture or dislocation. Slight hammertoe deformity of the digits. No erosion or bony destruction. Degenerative change in the mid and hindfoot most prominent at the talonavicular joint. Plantar calcaneal spur and Achilles tendon enthesophyte. Generalized soft tissue edema, prominent over the dorsum of the foot. There is also a rounded soft tissue density projecting over the plantar aspect of the calcaneus, indeterminate. No soft tissue air. No radiopaque foreign body. There are vascular calcifications. IMPRESSION: 1. Generalized soft tissue edema. No acute osseous abnormality. 2. Rounded soft tissue density projecting over the plantar aspect of the calcaneus, indeterminate, may represent site of reported blister. Recommend correlation with physical exam. 3. Degenerative change in the mid and hindfoot. Plantar calcaneal spur and Achilles tendon enthesophyte. Electronically Signed   By: Keith Rake M.D.   On: 11/05/2020 21:10    Procedures Procedures (including critical care time)  Medications Ordered in ED Medications - No data to display  ED Course  I have reviewed the triage vital signs and the nursing notes.  Pertinent labs & imaging results that were available during my care of the patient  were reviewed by me and considered in my medical decision making (see chart for details).    MDM Rules/Calculators/A&P                          Pt here with bulla of the right medial foot.  Recently treated for cellulitis of BLE's.  Denies known injury to his foot or wearing poorly fitted shoes.  Infection and wounds of the lower extremities appear to be improving since I saw him in early December.  Blister  of the foot is new.  There is a small amt of serosanguinous fluid draining, but it is mostly intact.  Labs without significant leukocytosis, creatinine unremarkable.  Blood sugar improved from CBG taken on arrival.  No concerning sx's for sepsis.  XR of the foot w/o evidence of osteomyelitis, or FB.  NV intact.   I feel that pt is appropriate for d/c home, but will need close f/u this week with podiatry.  He agrees to plan.  I will start him back on Bactrim since he has hx of MRSA infection.  Bulky dressing applied to the foot.     Final Clinical Impression(s) / ED Diagnoses Final diagnoses:  Right foot pain  Skin bulla    Rx / DC Orders ED Discharge Orders    None       Rosey Bath 11/05/20 2335    Terrilee Files, MD 11/06/20 1007

## 2020-11-05 NOTE — ED Notes (Signed)
Pt spilt urinal resulting in pt needing a bed linen change

## 2020-11-05 NOTE — ED Notes (Signed)
ED Provider at bedside. 

## 2020-11-05 NOTE — ED Triage Notes (Signed)
Pt c/o a hematoma on his rt foot.

## 2020-11-22 ENCOUNTER — Emergency Department (HOSPITAL_COMMUNITY): Payer: Medicaid Other

## 2020-11-22 ENCOUNTER — Encounter (HOSPITAL_COMMUNITY): Payer: Self-pay

## 2020-11-22 ENCOUNTER — Inpatient Hospital Stay (HOSPITAL_COMMUNITY)
Admission: EM | Admit: 2020-11-22 | Discharge: 2020-11-29 | DRG: 872 | Disposition: A | Discharge status: Skilled Nursing Facility | Payer: Medicaid Other | Attending: Family Medicine | Admitting: Family Medicine

## 2020-11-22 ENCOUNTER — Other Ambulatory Visit: Payer: Self-pay

## 2020-11-22 DIAGNOSIS — L89610 Pressure ulcer of right heel, unstageable: Secondary | ICD-10-CM | POA: Diagnosis present

## 2020-11-22 DIAGNOSIS — L03116 Cellulitis of left lower limb: Secondary | ICD-10-CM | POA: Diagnosis present

## 2020-11-22 DIAGNOSIS — I110 Hypertensive heart disease with heart failure: Secondary | ICD-10-CM | POA: Diagnosis present

## 2020-11-22 DIAGNOSIS — Z20822 Contact with and (suspected) exposure to covid-19: Secondary | ICD-10-CM | POA: Diagnosis present

## 2020-11-22 DIAGNOSIS — K5900 Constipation, unspecified: Secondary | ICD-10-CM | POA: Diagnosis present

## 2020-11-22 DIAGNOSIS — Z951 Presence of aortocoronary bypass graft: Secondary | ICD-10-CM | POA: Diagnosis not present

## 2020-11-22 DIAGNOSIS — E1159 Type 2 diabetes mellitus with other circulatory complications: Secondary | ICD-10-CM

## 2020-11-22 DIAGNOSIS — Z7951 Long term (current) use of inhaled steroids: Secondary | ICD-10-CM | POA: Diagnosis not present

## 2020-11-22 DIAGNOSIS — Z7982 Long term (current) use of aspirin: Secondary | ICD-10-CM | POA: Diagnosis not present

## 2020-11-22 DIAGNOSIS — Z8249 Family history of ischemic heart disease and other diseases of the circulatory system: Secondary | ICD-10-CM | POA: Diagnosis not present

## 2020-11-22 DIAGNOSIS — Z794 Long term (current) use of insulin: Secondary | ICD-10-CM

## 2020-11-22 DIAGNOSIS — R6520 Severe sepsis without septic shock: Secondary | ICD-10-CM | POA: Diagnosis not present

## 2020-11-22 DIAGNOSIS — I2510 Atherosclerotic heart disease of native coronary artery without angina pectoris: Secondary | ICD-10-CM | POA: Diagnosis present

## 2020-11-22 DIAGNOSIS — L03115 Cellulitis of right lower limb: Secondary | ICD-10-CM | POA: Diagnosis present

## 2020-11-22 DIAGNOSIS — E11649 Type 2 diabetes mellitus with hypoglycemia without coma: Secondary | ICD-10-CM

## 2020-11-22 DIAGNOSIS — Z8616 Personal history of COVID-19: Secondary | ICD-10-CM | POA: Diagnosis not present

## 2020-11-22 DIAGNOSIS — I5042 Chronic combined systolic (congestive) and diastolic (congestive) heart failure: Secondary | ICD-10-CM | POA: Diagnosis present

## 2020-11-22 DIAGNOSIS — G4733 Obstructive sleep apnea (adult) (pediatric): Secondary | ICD-10-CM | POA: Diagnosis present

## 2020-11-22 DIAGNOSIS — Z79899 Other long term (current) drug therapy: Secondary | ICD-10-CM | POA: Diagnosis not present

## 2020-11-22 DIAGNOSIS — Z7902 Long term (current) use of antithrombotics/antiplatelets: Secondary | ICD-10-CM | POA: Diagnosis not present

## 2020-11-22 DIAGNOSIS — N400 Enlarged prostate without lower urinary tract symptoms: Secondary | ICD-10-CM | POA: Diagnosis present

## 2020-11-22 DIAGNOSIS — I10 Essential (primary) hypertension: Secondary | ICD-10-CM | POA: Diagnosis present

## 2020-11-22 DIAGNOSIS — Z888 Allergy status to other drugs, medicaments and biological substances status: Secondary | ICD-10-CM

## 2020-11-22 DIAGNOSIS — J449 Chronic obstructive pulmonary disease, unspecified: Secondary | ICD-10-CM | POA: Diagnosis present

## 2020-11-22 DIAGNOSIS — E1165 Type 2 diabetes mellitus with hyperglycemia: Secondary | ICD-10-CM | POA: Diagnosis present

## 2020-11-22 DIAGNOSIS — I255 Ischemic cardiomyopathy: Secondary | ICD-10-CM | POA: Diagnosis present

## 2020-11-22 DIAGNOSIS — A419 Sepsis, unspecified organism: Principal | ICD-10-CM | POA: Diagnosis present

## 2020-11-22 DIAGNOSIS — Z87891 Personal history of nicotine dependence: Secondary | ICD-10-CM | POA: Diagnosis not present

## 2020-11-22 DIAGNOSIS — E785 Hyperlipidemia, unspecified: Secondary | ICD-10-CM | POA: Diagnosis present

## 2020-11-22 DIAGNOSIS — L03119 Cellulitis of unspecified part of limb: Secondary | ICD-10-CM

## 2020-11-22 DIAGNOSIS — Z880 Allergy status to penicillin: Secondary | ICD-10-CM

## 2020-11-22 DIAGNOSIS — E119 Type 2 diabetes mellitus without complications: Secondary | ICD-10-CM

## 2020-11-22 HISTORY — DX: Cellulitis, unspecified: L03.90

## 2020-11-22 LAB — CBC WITH DIFFERENTIAL/PLATELET
Abs Immature Granulocytes: 0.26 10*3/uL — ABNORMAL HIGH (ref 0.00–0.07)
Basophils Absolute: 0.1 10*3/uL (ref 0.0–0.1)
Basophils Relative: 1 %
Eosinophils Absolute: 0.7 10*3/uL — ABNORMAL HIGH (ref 0.0–0.5)
Eosinophils Relative: 5 %
HCT: 35.4 % — ABNORMAL LOW (ref 39.0–52.0)
Hemoglobin: 11.2 g/dL — ABNORMAL LOW (ref 13.0–17.0)
Immature Granulocytes: 2 %
Lymphocytes Relative: 16 %
Lymphs Abs: 1.9 10*3/uL (ref 0.7–4.0)
MCH: 27.3 pg (ref 26.0–34.0)
MCHC: 31.6 g/dL (ref 30.0–36.0)
MCV: 86.1 fL (ref 80.0–100.0)
Monocytes Absolute: 1.1 10*3/uL — ABNORMAL HIGH (ref 0.1–1.0)
Monocytes Relative: 9 %
Neutro Abs: 8.2 10*3/uL — ABNORMAL HIGH (ref 1.7–7.7)
Neutrophils Relative %: 67 %
Platelets: 324 10*3/uL (ref 150–400)
RBC: 4.11 MIL/uL — ABNORMAL LOW (ref 4.22–5.81)
RDW: 13.4 % (ref 11.5–15.5)
WBC: 12.2 10*3/uL — ABNORMAL HIGH (ref 4.0–10.5)
nRBC: 0 % (ref 0.0–0.2)

## 2020-11-22 LAB — RESP PANEL BY RT-PCR (FLU A&B, COVID) ARPGX2
Influenza A by PCR: NEGATIVE
Influenza B by PCR: NEGATIVE
SARS Coronavirus 2 by RT PCR: NEGATIVE

## 2020-11-22 LAB — GLUCOSE, CAPILLARY: Glucose-Capillary: 203 mg/dL — ABNORMAL HIGH (ref 70–99)

## 2020-11-22 LAB — CBG MONITORING, ED
Glucose-Capillary: 206 mg/dL — ABNORMAL HIGH (ref 70–99)
Glucose-Capillary: 196 mg/dL — ABNORMAL HIGH (ref 70–99)

## 2020-11-22 LAB — BASIC METABOLIC PANEL
Anion gap: 10 (ref 5–15)
BUN: 34 mg/dL — ABNORMAL HIGH (ref 6–20)
CO2: 29 mmol/L (ref 22–32)
Calcium: 8.8 mg/dL — ABNORMAL LOW (ref 8.9–10.3)
Chloride: 97 mmol/L — ABNORMAL LOW (ref 98–111)
Creatinine, Ser: 1.04 mg/dL (ref 0.61–1.24)
GFR, Estimated: >60 mL/min (ref >60)
Glucose, Bld: 204 mg/dL — ABNORMAL HIGH (ref 70–99)
Potassium: 3.9 mmol/L (ref 3.5–5.1)
Sodium: 136 mmol/L (ref 135–145)

## 2020-11-22 LAB — LACTIC ACID, PLASMA: Lactic Acid, Venous: 1.7 mmol/L (ref 0.5–1.9)

## 2020-11-22 LAB — BRAIN NATRIURETIC PEPTIDE: B Natriuretic Peptide: 65 pg/mL (ref 0.0–100.0)

## 2020-11-22 IMAGING — MR MR FOOT*R* W/O CM
5 series · 34 of 40 positions shown · non-contrast
Comparison: None.

CLINICAL DATA: Limping. Acute foot pain. Infection suspected.
Assess for cellulitis. Nonhealing diabetic wound to the heel.

EXAM:
MRI OF THE RIGHT FOREFOOT WITHOUT CONTRAST
TECHNIQUE: Multiplanar, multisequence MR imaging of the right was performed. No
intravenous contrast was administered.

[Series 3: PD fat-sat · axial · right · 3.0mm · 0.55mm/px · z∈[-112,+2]mm · 9 of 30 slices shown]
[im 1/30]
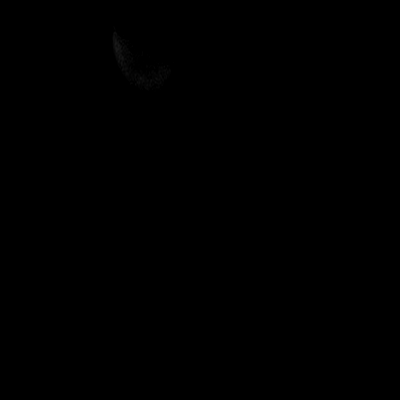
[im 4/30]
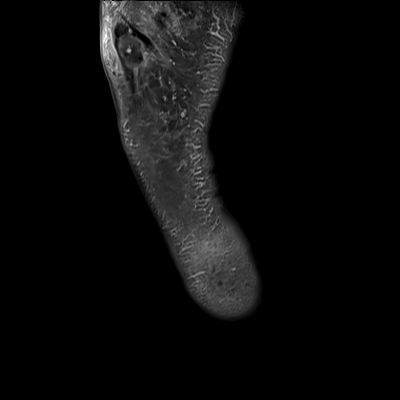
[im 8/30]
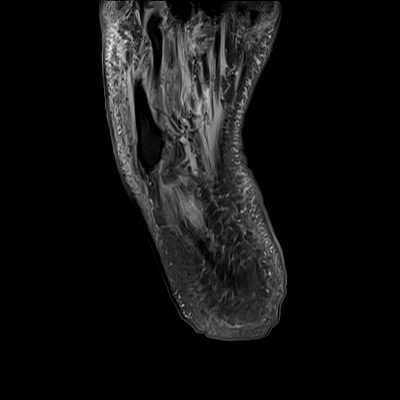
[im 11/30]
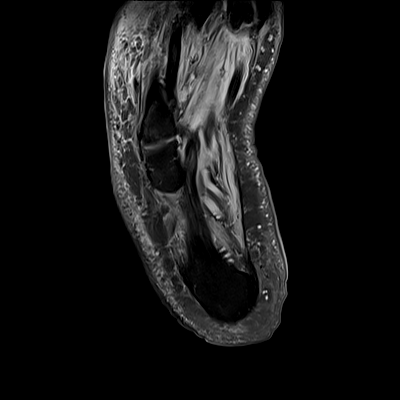
[im 15/30]
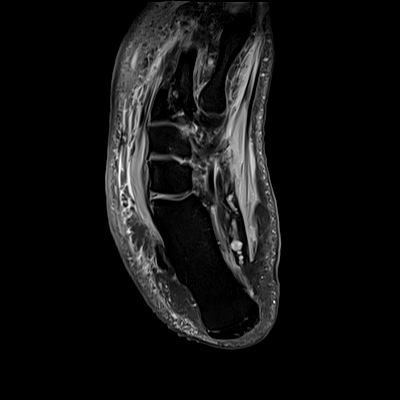
[im 19/30]
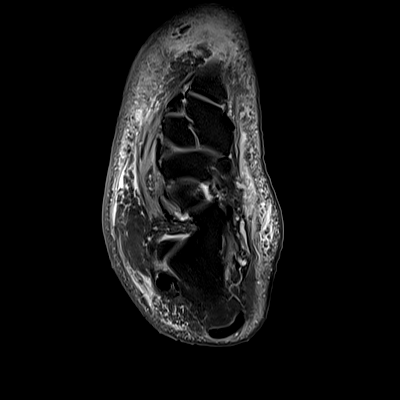
[im 22/30]
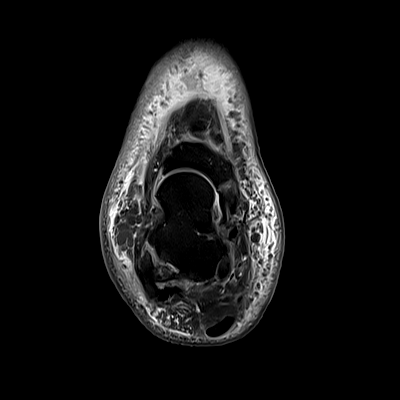
[im 26/30]
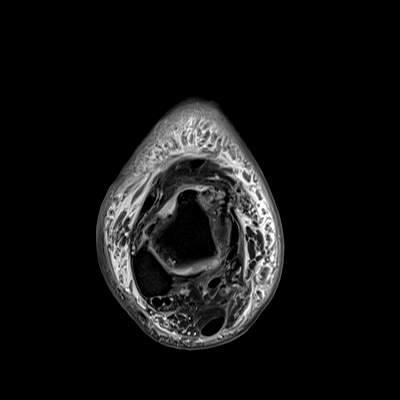
[im 30/30]
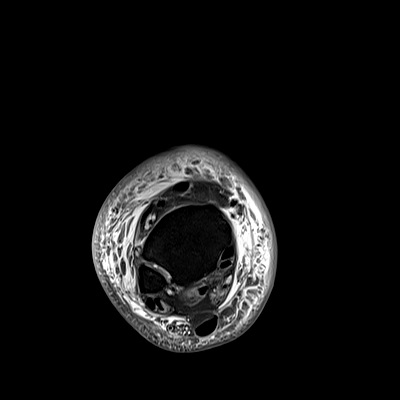

[Series 4: T2 fat-sat · axial · right · 3.0mm · 0.57mm/px · z∈[-110,+5]mm · 9 of 30 slices shown (1 of 2)]
[im 1/30]
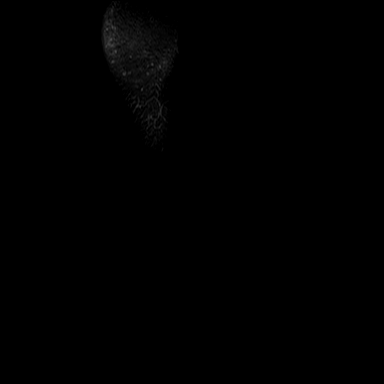
[im 4/30]
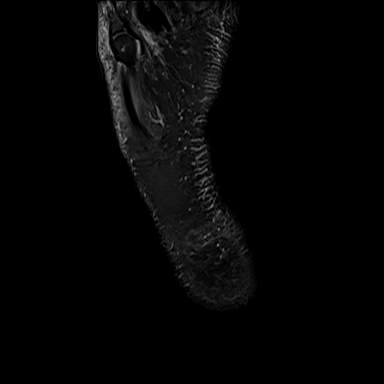
[im 8/30]
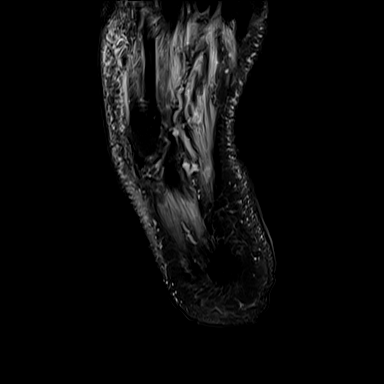
[im 11/30]
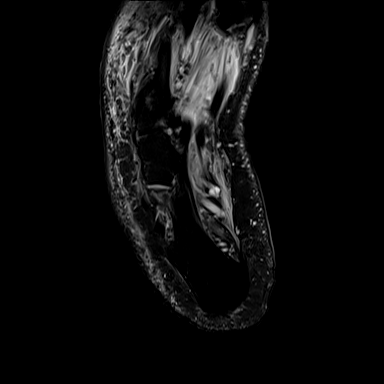
[im 15/30]
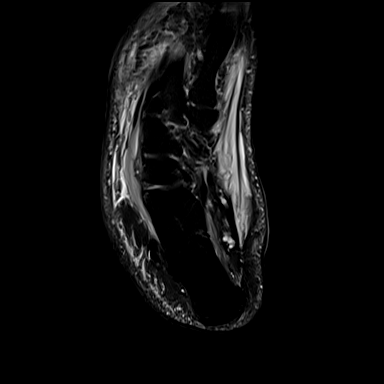
[im 19/30]
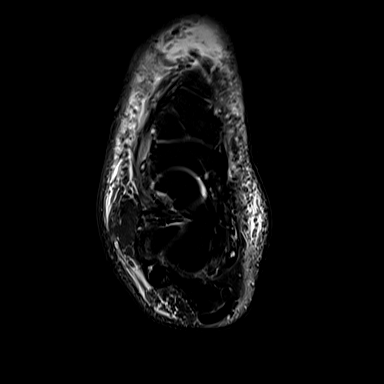
[im 22/30]
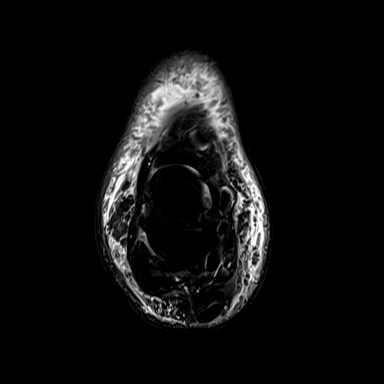
[im 26/30]
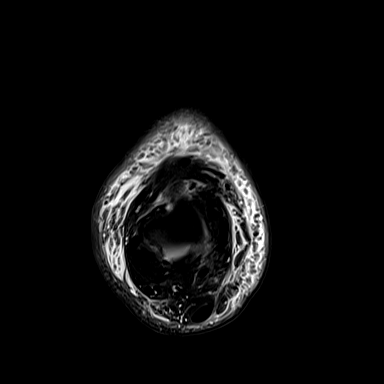
[im 30/30]
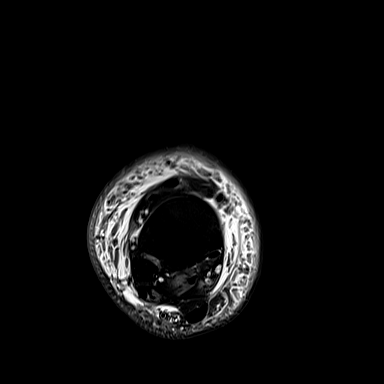

[Series 5: T2 fat-sat · coronal · right · 3.0mm · 0.57mm/px · 8 of 30 slices shown (2 of 2)]
[im 1/30]
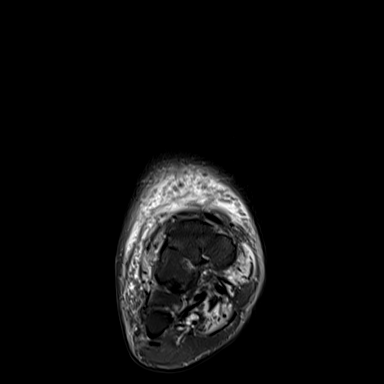
[im 5/30]
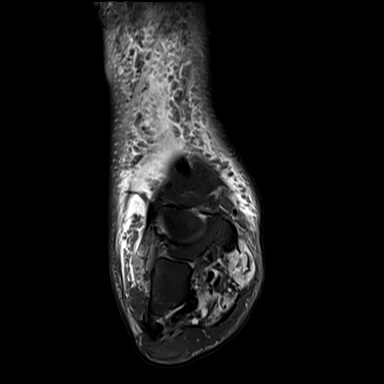
[im 9/30]
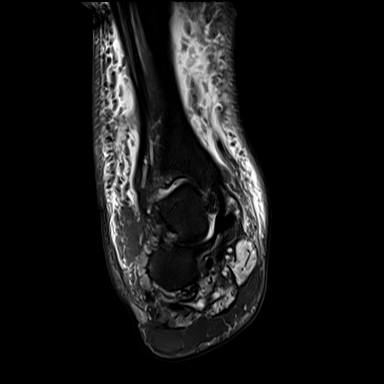
[im 13/30]
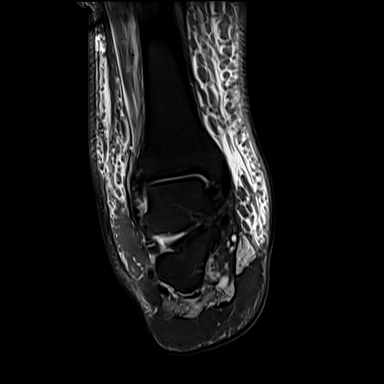
[im 17/30]
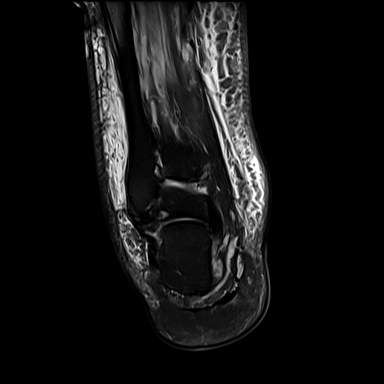
[im 21/30]
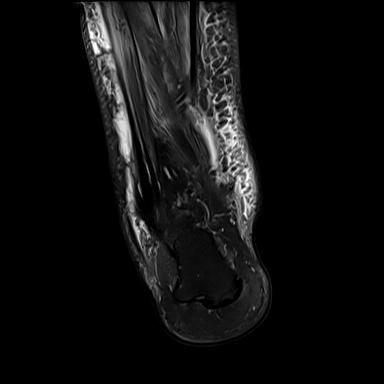
[im 25/30]
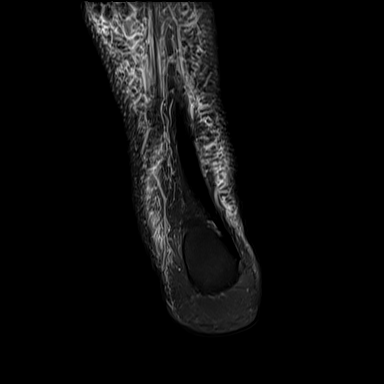
[im 30/30]
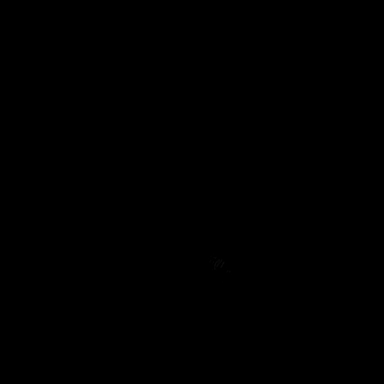

[Series 6: STIR · sagittal · right · 4.0mm · 0.31mm/px · 1 of 26 slices shown]
[im 1/26]
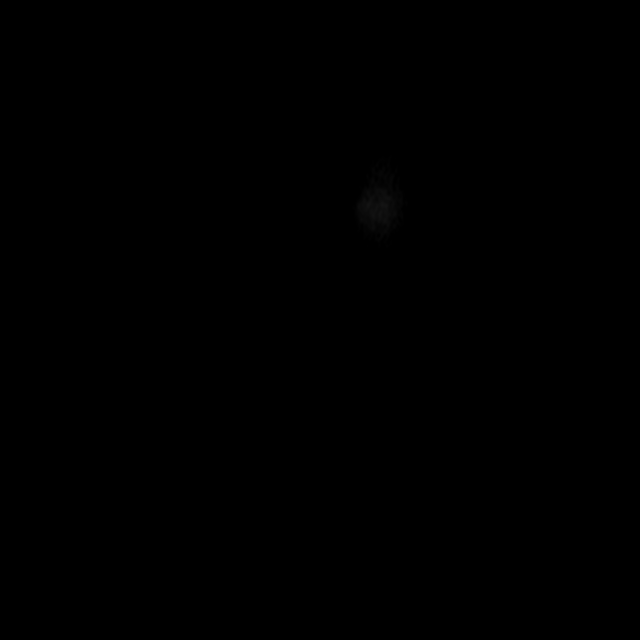

[Series 7: T1 · sagittal · right · 4.0mm · 0.78mm/px · 7 of 26 slices shown]
[im 1/26]
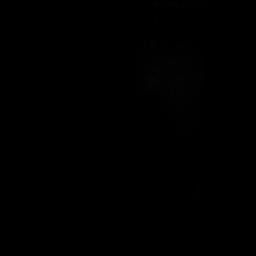
[im 5/26]
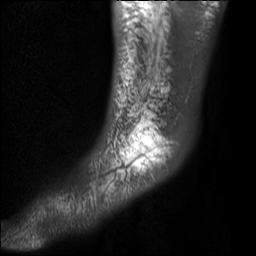
[im 9/26]
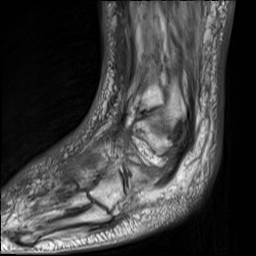
[im 13/26]
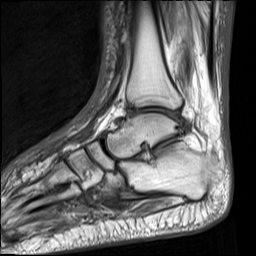
[im 17/26]
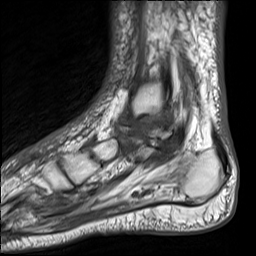
[im 21/26]
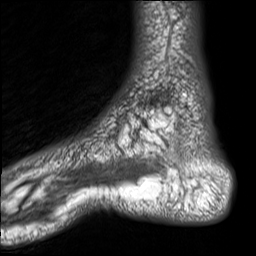
[im 26/26]
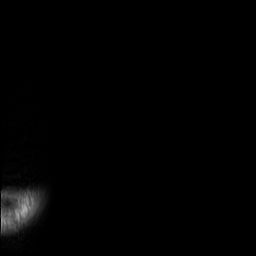

[34 of 40 positions shown; findings below may reference images not displayed]

FINDINGS: Bones/Joint/Cartilage

There is no bone marrow signal abnormality. Specifically, no
evidence of osteomyelitis or edema. No evidence of a fracture. No
bone lesions.

Ankle joint is normally spaced and aligned. No cartilage defect. No
joint effusion. Subtalar and midfoot joints are also normally spaced
and aligned with no visualized cartilage defect or effusion.

Ligaments

Medial and lateral ligament complexes are intact. Ankle
synchondrosis is intact. Plantar fascia is normal in thickness and
signal.

Muscles and Tendons

Achilles tendon, posterior tibialis tendon and peroneal tendons as
well as the remaining ankle tendons are normal in signal and
thickness.

There is diffuse increased signal within the intrinsic foot
musculature consistent with edema and/or cellulitis.

Soft tissues

Diffuse soft tissue high T2 signal extending from distal leg,
crossing the ankle and into the foot, predominating in the foot over
the dorsal aspect. This is consistent with edema, cellulitis or a
combination. No focal soft tissue fluid collection to suggest an
abscess. No convincing soft tissue air.
IMPRESSION: 1. Abnormal increased T2 signal of the soft tissues of the distal
leg, ankle and visualized foot, in the foot predominantly over the
dorsum. This is consistent with edema or cellulitis. Without the
benefit contrast, the presence and degree of cellulitis cannot be
accurately assessed.
2. No evidence of an abscess.  No evidence of soft tissue air.
3. No bone marrow signal abnormality to suggest osteomyelitis.
4. No joint abnormality.  Intact ligaments and tendons.

## 2020-11-22 IMAGING — DX DG CHEST 1V PORT
1 series · 1 of 1 positions shown · non-contrast
Comparison: Chest radiograph dated [DATE].

CLINICAL DATA: 59-year-old male with edema.

EXAM:
PORTABLE CHEST 1 VIEW

[chest ap]
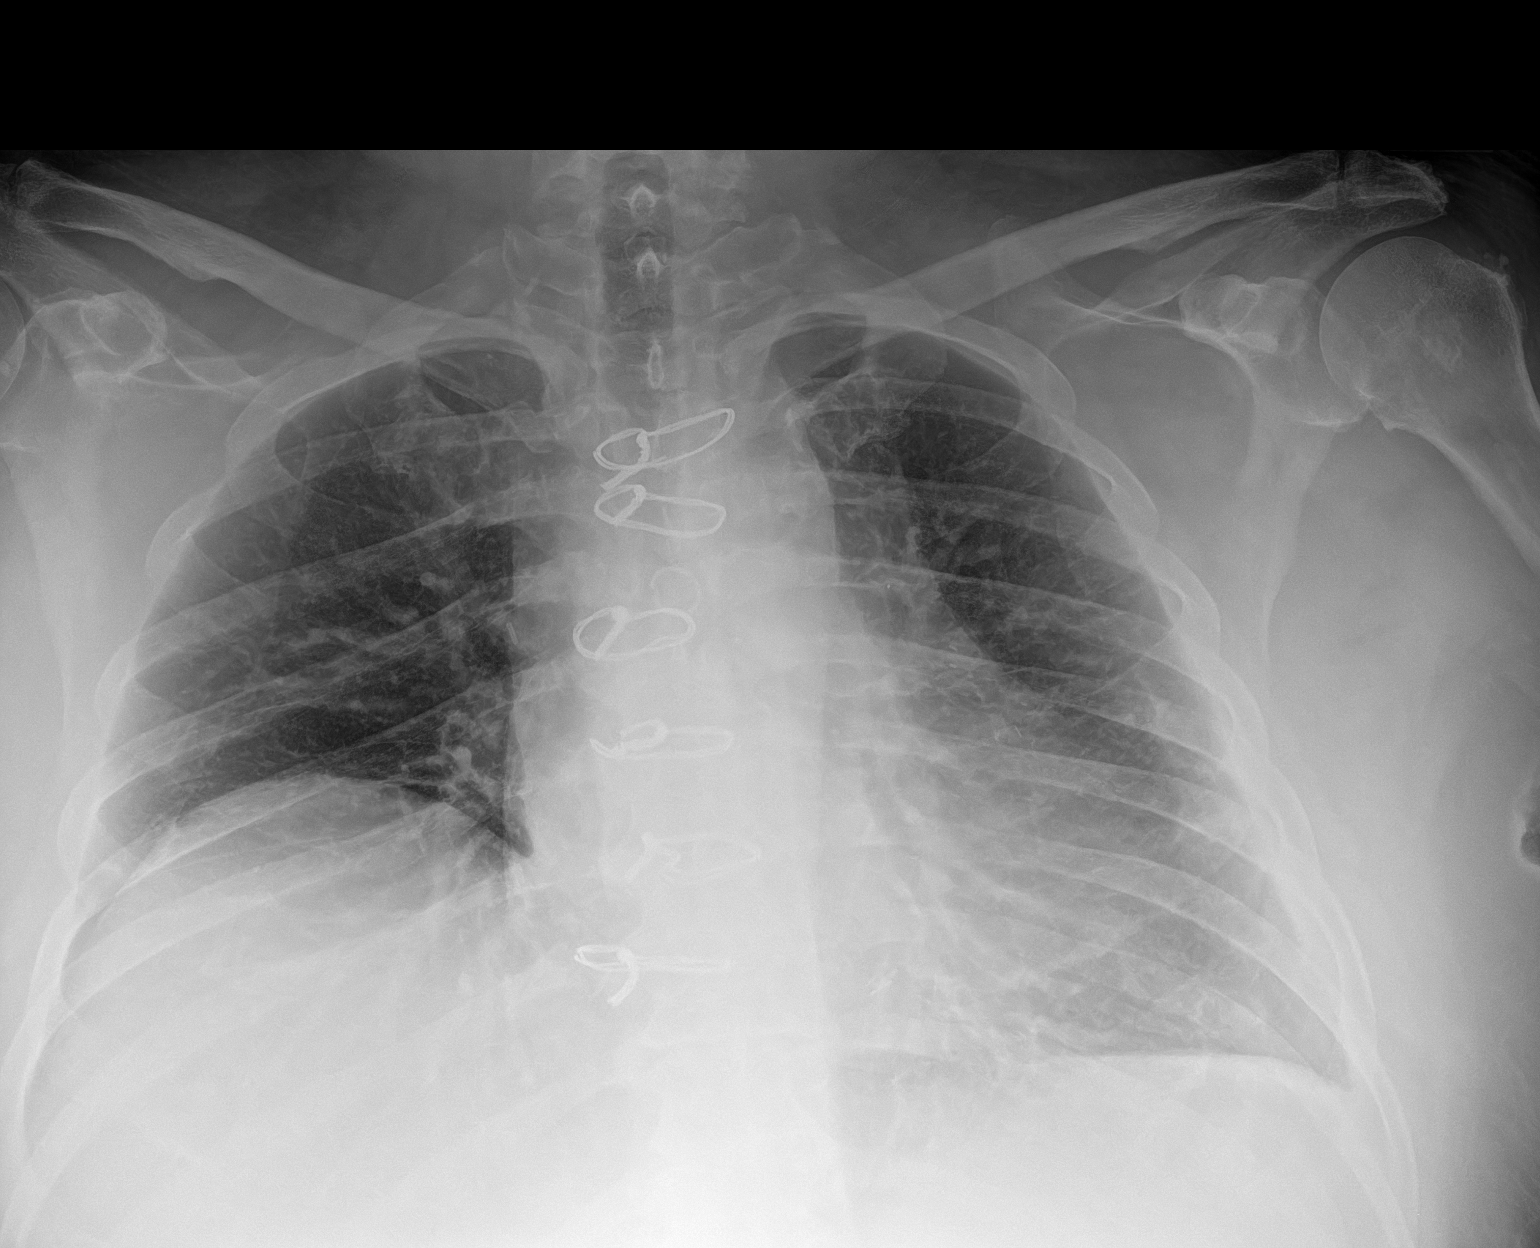

[1 of 1 positions shown; findings below may reference images not displayed]

FINDINGS: There is mild eventration of the right hemidiaphragm. Left lateral
pleural thickening and left mid to lower lung field
atelectasis/scarring similar to prior radiograph. No new
consolidation. No pleural effusion pneumothorax. Stable
cardiomegaly. Median sternotomy wires and CABG vascular clips. No
acute osseous pathology.
IMPRESSION: 1. No acute cardiopulmonary process.
2. Stable cardiomegaly.

## 2020-11-22 IMAGING — DX DG FOOT COMPLETE 3+V*R*
3 series · 3 of 3 positions shown · non-contrast
Comparison: None.

CLINICAL DATA: Cellulitis and swelling both legs/right foot wound
at heel/diabetic

EXAM:
RIGHT FOOT COMPLETE - 3+ VIEW

[foot ap]
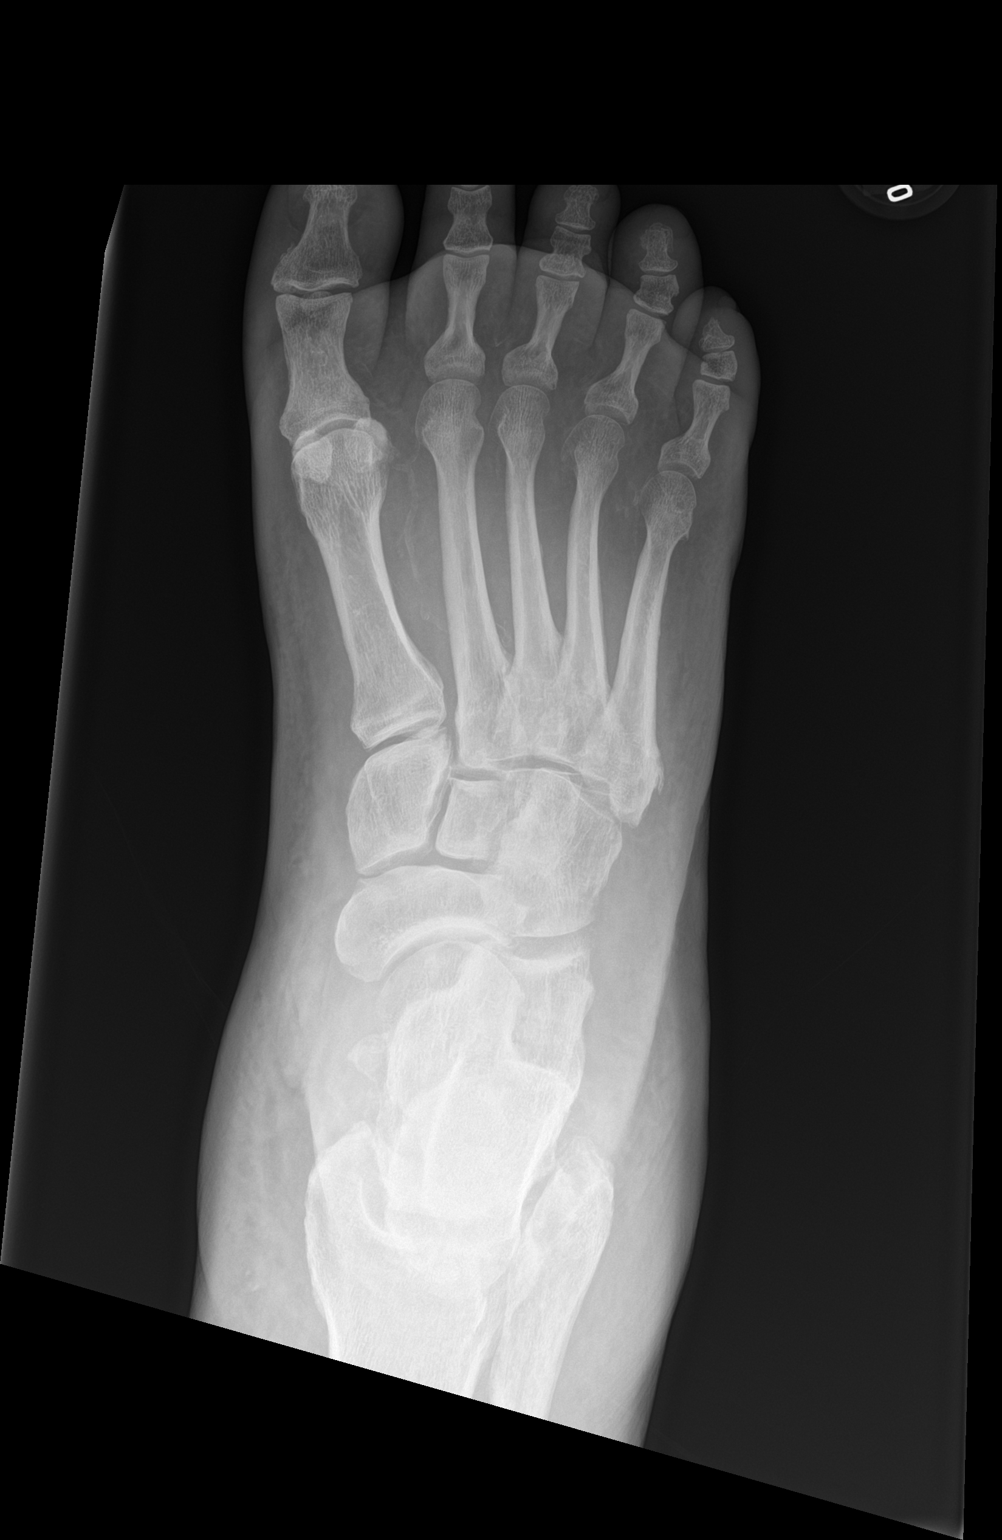

[foot obl]
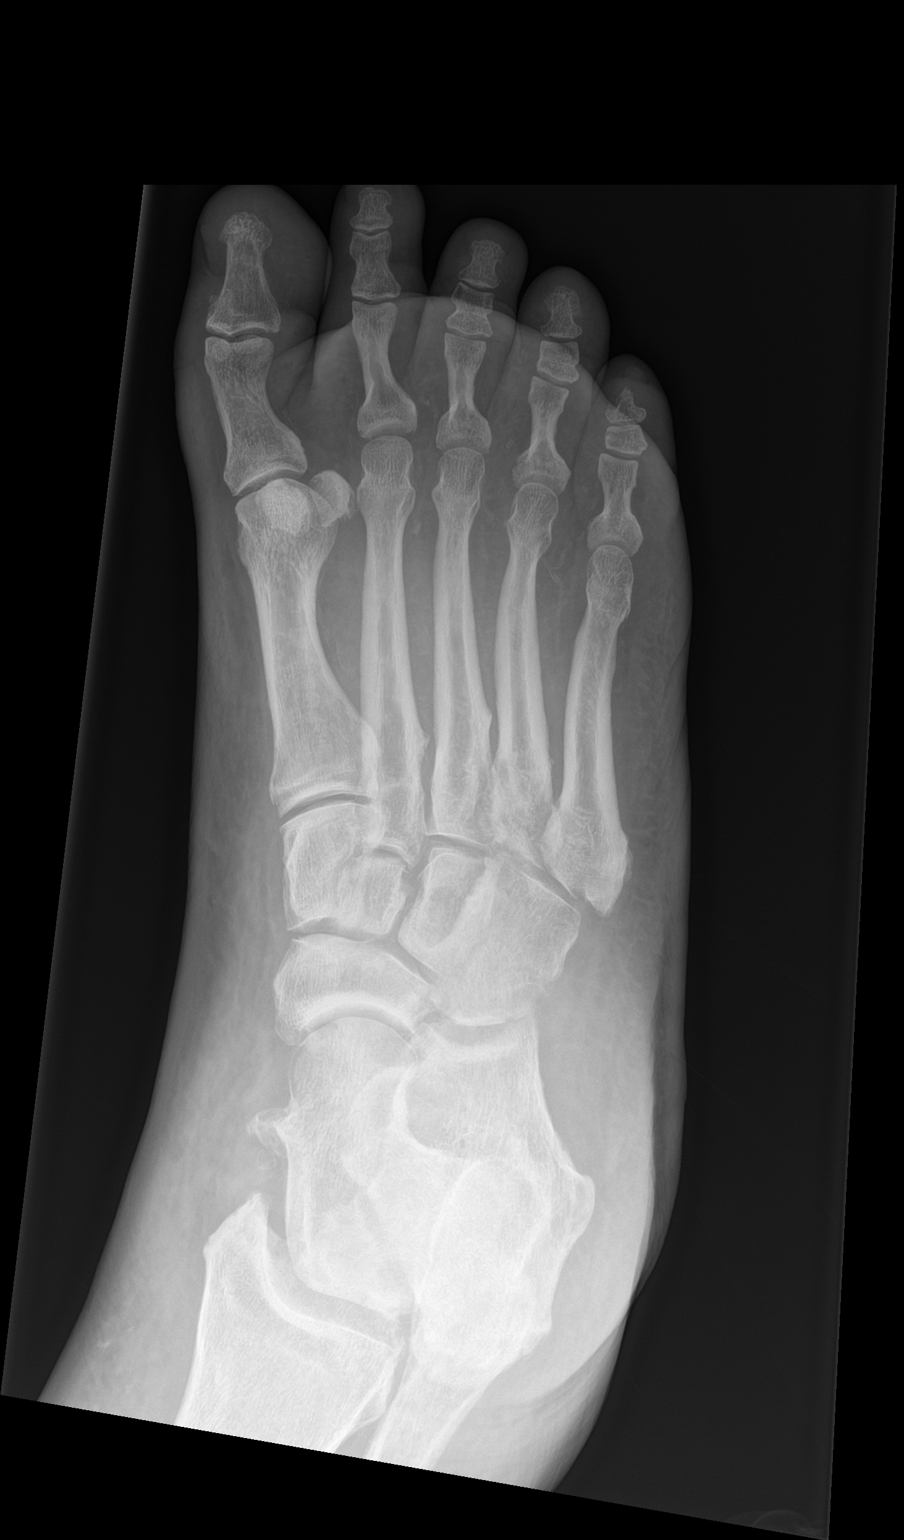

[foot lat]
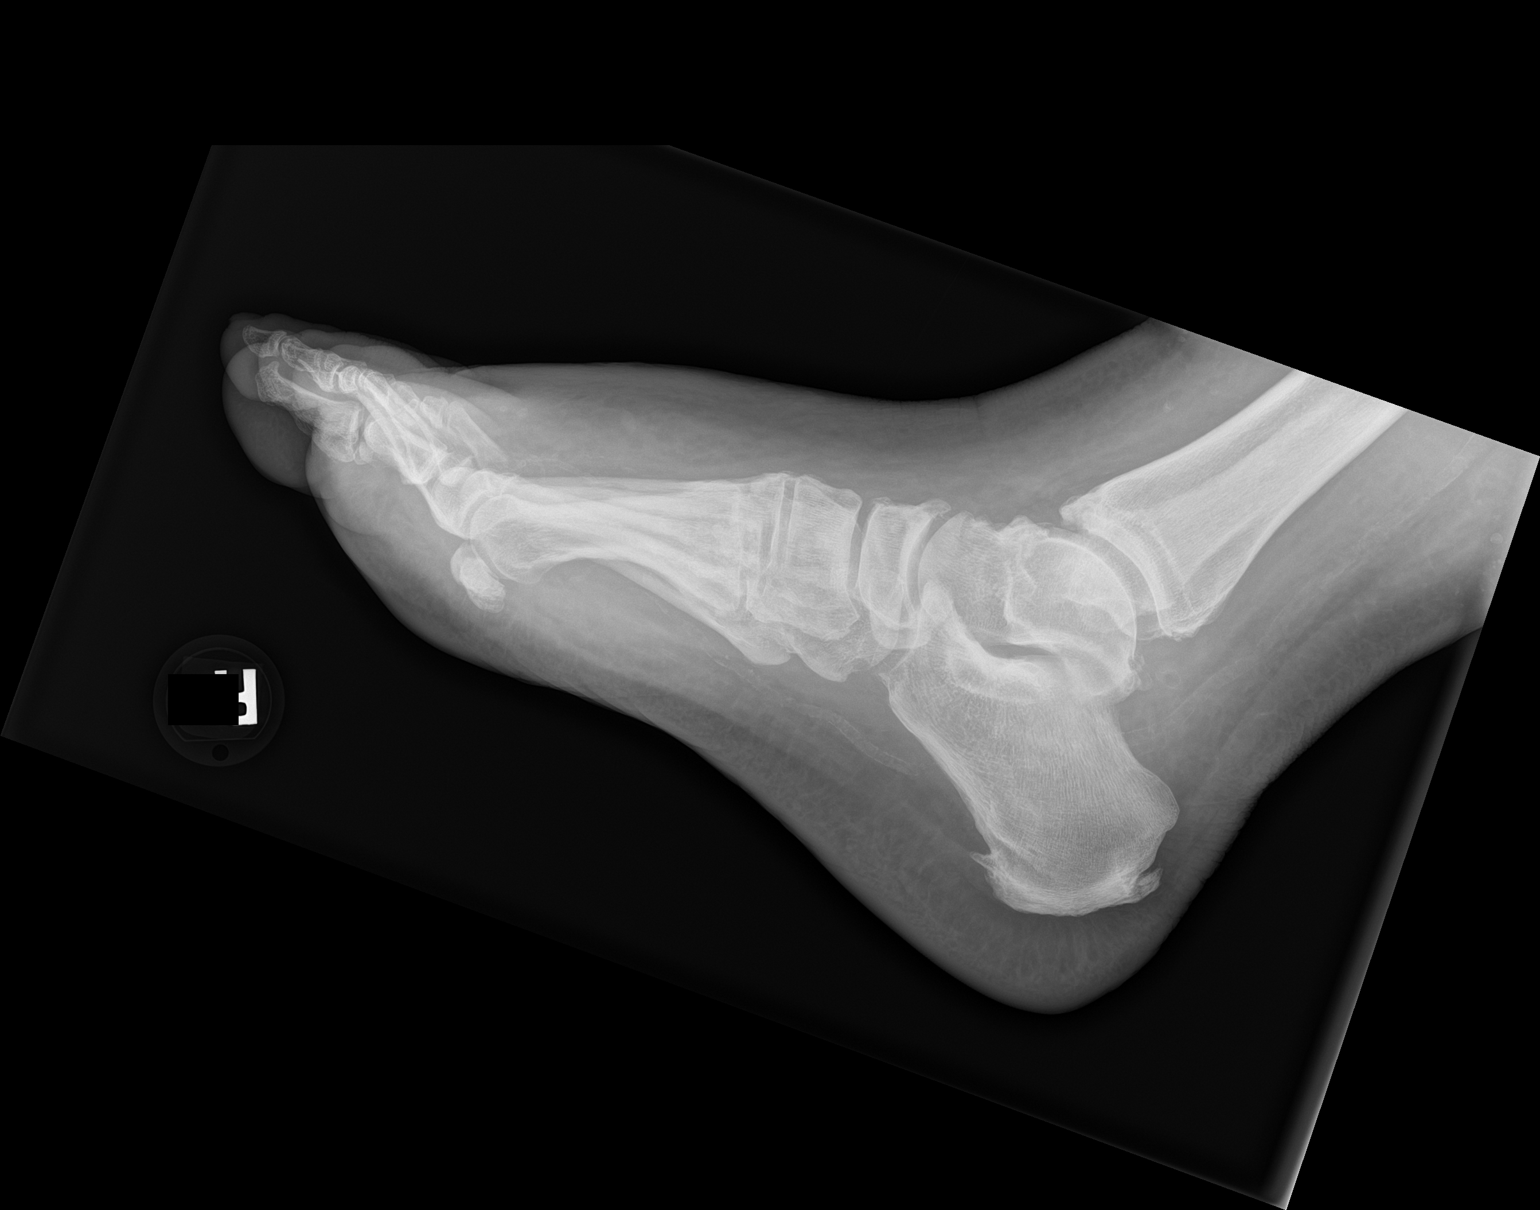

[3 of 3 positions shown; findings below may reference images not displayed]

FINDINGS: No fracture or bone lesion.

No bone resorption to suggest osteomyelitis.

Joints are normally spaced and aligned.

There are dorsal and plantar calcaneal spurs.

Diffuse soft tissue swelling most evident over the dorsal forefoot.
There are arterial vascular calcifications. No soft tissue air.
IMPRESSION: 1. No fracture or dislocation.
2. No evidence of osteomyelitis.
3. Soft tissue swelling without evidence of soft tissue air.

## 2020-11-22 MED ORDER — INSULIN ASPART PROT & ASPART (70-30 MIX) 100 UNIT/ML ~~LOC~~ SUSP
55.0000 [IU] | Freq: Two times a day (BID) | SUBCUTANEOUS | Status: DC
  Administered 2020-11-23 – 2020-11-24 (×4): 55 [IU] via SUBCUTANEOUS
  Filled 2020-11-22 (×2): qty 10

## 2020-11-22 MED ORDER — ACETAMINOPHEN 325 MG PO TABS
650.0000 mg | ORAL_TABLET | Freq: Four times a day (QID) | ORAL | Status: DC | PRN
  Administered 2020-11-22 – 2020-11-26 (×2): 650 mg via ORAL
  Filled 2020-11-22 (×2): qty 2

## 2020-11-22 MED ORDER — SODIUM CHLORIDE 0.9 % IV SOLN
2.0000 g | INTRAVENOUS | Status: DC
  Administered 2020-11-22 – 2020-11-28 (×7): 2 g via INTRAVENOUS
  Filled 2020-11-22 (×7): qty 20

## 2020-11-22 MED ORDER — FUROSEMIDE 10 MG/ML IJ SOLN
40.0000 mg | Freq: Once | INTRAMUSCULAR | Status: AC
  Administered 2020-11-22: 40 mg via INTRAVENOUS
  Filled 2020-11-22: qty 4

## 2020-11-22 MED ORDER — ATORVASTATIN CALCIUM 40 MG PO TABS
80.0000 mg | ORAL_TABLET | Freq: Every day | ORAL | Status: DC
  Administered 2020-11-22 – 2020-11-29 (×8): 80 mg via ORAL
  Filled 2020-11-22 (×8): qty 2

## 2020-11-22 MED ORDER — POLYETHYLENE GLYCOL 3350 17 G PO PACK
17.0000 g | PACK | Freq: Every day | ORAL | Status: DC | PRN
  Filled 2020-11-22: qty 1

## 2020-11-22 MED ORDER — PANTOPRAZOLE SODIUM 40 MG PO TBEC
40.0000 mg | DELAYED_RELEASE_TABLET | Freq: Every day | ORAL | Status: DC
  Administered 2020-11-22 – 2020-11-29 (×8): 40 mg via ORAL
  Filled 2020-11-22 (×8): qty 1

## 2020-11-22 MED ORDER — VANCOMYCIN HCL 1500 MG/300ML IV SOLN
1500.0000 mg | Freq: Once | INTRAVENOUS | Status: AC
  Administered 2020-11-22: 1500 mg via INTRAVENOUS
  Filled 2020-11-22: qty 300

## 2020-11-22 MED ORDER — ONDANSETRON HCL 4 MG/2ML IJ SOLN: 4.0000 mg | Freq: Four times a day (QID) | INTRAMUSCULAR | Status: DC | PRN

## 2020-11-22 MED ORDER — GABAPENTIN 300 MG PO CAPS
900.0000 mg | ORAL_CAPSULE | Freq: Two times a day (BID) | ORAL | Status: DC
  Administered 2020-11-22 – 2020-11-29 (×14): 900 mg via ORAL
  Filled 2020-11-22 (×14): qty 3

## 2020-11-22 MED ORDER — ONDANSETRON HCL 4 MG PO TABS: 4.0000 mg | ORAL_TABLET | Freq: Four times a day (QID) | ORAL | Status: DC | PRN

## 2020-11-22 MED ORDER — TAMSULOSIN HCL 0.4 MG PO CAPS
0.4000 mg | ORAL_CAPSULE | Freq: Every day | ORAL | Status: DC
  Administered 2020-11-22 – 2020-11-29 (×8): 0.4 mg via ORAL
  Filled 2020-11-22 (×8): qty 1

## 2020-11-22 MED ORDER — INSULIN ASPART 100 UNIT/ML ~~LOC~~ SOLN
0.0000 [IU] | Freq: Three times a day (TID) | SUBCUTANEOUS | Status: DC
  Administered 2020-11-23: 3 [IU] via SUBCUTANEOUS
  Administered 2020-11-23: 5 [IU] via SUBCUTANEOUS
  Administered 2020-11-23: 8 [IU] via SUBCUTANEOUS
  Administered 2020-11-24: 2 [IU] via SUBCUTANEOUS
  Administered 2020-11-24: 3 [IU] via SUBCUTANEOUS
  Administered 2020-11-24 – 2020-11-25 (×2): 5 [IU] via SUBCUTANEOUS
  Administered 2020-11-25: 3 [IU] via SUBCUTANEOUS
  Administered 2020-11-25: 2 [IU] via SUBCUTANEOUS
  Administered 2020-11-26: 5 [IU] via SUBCUTANEOUS
  Administered 2020-11-26 (×2): 2 [IU] via SUBCUTANEOUS
  Administered 2020-11-27: 3 [IU] via SUBCUTANEOUS
  Administered 2020-11-27 – 2020-11-28 (×2): 2 [IU] via SUBCUTANEOUS
  Administered 2020-11-28 – 2020-11-29 (×2): 3 [IU] via SUBCUTANEOUS

## 2020-11-22 MED ORDER — ENOXAPARIN SODIUM 60 MG/0.6ML ~~LOC~~ SOLN
60.0000 mg | SUBCUTANEOUS | Status: DC
  Administered 2020-11-22 – 2020-11-28 (×7): 60 mg via SUBCUTANEOUS
  Filled 2020-11-22 (×7): qty 0.6

## 2020-11-22 MED ORDER — ASPIRIN EC 81 MG PO TBEC
81.0000 mg | DELAYED_RELEASE_TABLET | Freq: Every day | ORAL | Status: DC
  Administered 2020-11-22 – 2020-11-29 (×8): 81 mg via ORAL
  Filled 2020-11-22 (×8): qty 1

## 2020-11-22 MED ORDER — IPRATROPIUM-ALBUTEROL 0.5-2.5 (3) MG/3ML IN SOLN
3.0000 mL | RESPIRATORY_TRACT | Status: DC | PRN
  Administered 2020-11-23 – 2020-11-27 (×11): 3 mL via RESPIRATORY_TRACT
  Filled 2020-11-22 (×11): qty 3

## 2020-11-22 MED ORDER — LOSARTAN POTASSIUM 25 MG PO TABS
12.5000 mg | ORAL_TABLET | Freq: Every day | ORAL | Status: DC
  Administered 2020-11-23 – 2020-11-29 (×7): 12.5 mg via ORAL
  Filled 2020-11-22 (×7): qty 1

## 2020-11-22 MED ORDER — INSULIN ASPART 100 UNIT/ML ~~LOC~~ SOLN
0.0000 [IU] | Freq: Every day | SUBCUTANEOUS | Status: DC
  Administered 2020-11-24: 2 [IU] via SUBCUTANEOUS

## 2020-11-22 MED ORDER — VANCOMYCIN HCL IN DEXTROSE 1-5 GM/200ML-% IV SOLN
1000.0000 mg | Freq: Two times a day (BID) | INTRAVENOUS | Status: AC
  Administered 2020-11-23 – 2020-11-26 (×8): 1000 mg via INTRAVENOUS
  Filled 2020-11-22 (×8): qty 200

## 2020-11-22 MED ORDER — VENLAFAXINE HCL ER 75 MG PO CP24
150.0000 mg | ORAL_CAPSULE | Freq: Every day | ORAL | Status: DC
  Administered 2020-11-23 – 2020-11-29 (×7): 150 mg via ORAL
  Filled 2020-11-22 (×7): qty 2

## 2020-11-22 MED ORDER — ACETAMINOPHEN 650 MG RE SUPP: 650.0000 mg | Freq: Four times a day (QID) | RECTAL | Status: DC | PRN

## 2020-11-22 MED ORDER — METOPROLOL SUCCINATE ER 25 MG PO TB24
12.5000 mg | ORAL_TABLET | Freq: Two times a day (BID) | ORAL | Status: DC
  Administered 2020-11-22 – 2020-11-29 (×14): 12.5 mg via ORAL
  Filled 2020-11-22 (×14): qty 1

## 2020-11-22 MED ORDER — TORSEMIDE 20 MG PO TABS: 40.0000 mg | ORAL_TABLET | Freq: Two times a day (BID) | ORAL | Status: DC

## 2020-11-22 MED ORDER — VANCOMYCIN HCL IN DEXTROSE 1-5 GM/200ML-% IV SOLN
1000.0000 mg | Freq: Once | INTRAVENOUS | Status: AC
  Administered 2020-11-22: 1000 mg via INTRAVENOUS
  Filled 2020-11-22: qty 200

## 2020-11-22 MED ORDER — MOMETASONE FURO-FORMOTEROL FUM 200-5 MCG/ACT IN AERO
2.0000 | INHALATION_SPRAY | Freq: Two times a day (BID) | RESPIRATORY_TRACT | Status: DC
  Administered 2020-11-22 – 2020-11-29 (×14): 2 via RESPIRATORY_TRACT
  Filled 2020-11-22: qty 8.8

## 2020-11-22 NOTE — H&P (Addendum)
History and Physical    HEWLETT BARRUETA [id number] DOB: 1960/11/22 DOA: 11/22/2020  PCP: [name], MD   Patient coming from: Home  I have personally briefly reviewed patient's old medical records in [place]  Chief Complaint: Leg pain and swelling  HPI: Shane Chambers is a 60 y.o. male with medical history significant for systolic and diastolic CHF, CABG x 5, COPD, diabetes mellitus, hypertension, COVID-19 infection. Patient presented to the ED with complaints of leg swelling and pain with draining wounds ongoing for several weeks.  Patient reports wound started small swellings that opened up draining pus. Patient was in the ED 3 times this past December - 12/6, 12/16 and 11/05/20 patient has been treated with courses of antibiotics including doxycycline and Bactrim. Patient does not check his feet frequently, and reports when he checks his weight is unreliable.  On his last weight check a few days ago was 289, weight here in the ED is 281.  He reports his legs are currently the most swollen they have ever been.  He denies increasing abdominal gait.  No difficulty breathing.  No cough.  ED Course: Temperature 97.8.  Heart rate 87-  96, respiratory rate 18, blood pressure systolic [id number] to [id number].  O2 sats greater than 97% on room air.  WBC 12.  Unremarkable BMP.  Portable chest x-ray without acute abnormality, right foot x-ray without evidence of osteomyelitis.  Subsequent MRI foot without contrast-findings consistent with edema or cellulitis.  No abscess, no soft tissue air, no suggestion of osteomyelitis. IV vancomycin given.  Hospitalist to admit for further evaluation and management.  Review of Systems: As per HPI all other systems reviewed and negative.  Past Medical History:  Diagnosis Date  . CAD (coronary artery disease)    a. s/p CABG in 03/2020 with LIMA-LAD, RIMA-PL, RA-D1-RI-OM1  . Cellulitis   . Depression   . Diabetes mellitus without complication ([place])   .  Hyperlipidemia 12/01/2019  . Hypertension   . Ischemic cardiomyopathy    a. EF 20-25% by echo in 02/2020 b. at 40% by echo in 03/2020 c. EF normalized to 60-65% by echo in 04/2020  . Type 2 diabetes mellitus ([place])     Past Surgical History:  Procedure Laterality Date  . CORONARY ARTERY BYPASS GRAFT N/A 03/13/2020   Procedure: CORONARY ARTERY BYPASS GRAFTING (CABG) times five using bilateral Internal mammary arteries and left radial artery;  Surgeon: [name], MD;  Location: [facility];  Service: Open Heart Surgery;  Laterality: N/A;  . DENTAL SURGERY    . RADIAL ARTERY HARVEST Left 03/13/2020   Procedure: RADIAL ARTERY HARVEST,;  Surgeon: [name], MD;  Location: [facility];  Service: Open Heart Surgery;  Laterality: Left;  . RIGHT/LEFT HEART CATH AND CORONARY ANGIOGRAPHY N/A 03/07/2020   Procedure: RIGHT/LEFT HEART CATH AND CORONARY ANGIOGRAPHY;  Surgeon: Sherren Mocha, MD;  Location: [facility];  Service: Cardiovascular;  Laterality: N/A;  . TEE WITHOUT CARDIOVERSION N/A 03/13/2020   Procedure: TRANSESOPHAGEAL ECHOCARDIOGRAM (TEE);  Surgeon: [name], MD;  Location: [facility];  Service: Open Heart Surgery;  Laterality: N/A;     reports that he quit smoking about 25 years ago. His smoking use included cigarettes. He smoked 1.50 packs per day. He has never used smokeless tobacco. He reports current alcohol use. He reports that he does not use drugs.  Allergies  Allergen Reactions  . Ace Inhibitors Swelling and Cough  . Other Itching  [name]  . Penicillins     Has patient had a PCN reaction causing immediate rash, facial/tongue/throat swelling, SOB or lightheadedness with hypotension: Unknown Has patient had a PCN reaction causing severe rash involving mucus membranes or skin necrosis: Unknown Has patient had a PCN reaction that required hospitalization: Unknown Has patient had a PCN reaction occurring within the last 10 years: Unknown If all of the above answers  are "NO", then may proceed with Cephalosporin use.     Family History  Problem Relation Age of Onset  . Hypertension Mother     Prior to Admission medications   Medication Sig Start Date End Date Taking? Authorizing Provider  albuterol (VENTOLIN HFA) 108 (90 Base) MCG/ACT inhaler Inhale 1-2 puffs into the lungs every 6 (six) hours as needed for wheezing or shortness of breath. 05/24/20   Rigoberto Noel, MD  aspirin 81 MG EC tablet Take 81 mg by mouth daily.  03/10/16   [provider]  Aspirin Effervescent (ALKA-SELTZER ORIGINAL PO) Take 1 tablet by mouth daily as needed.    [provider]  atorvastatin (LIPITOR) 80 MG tablet Take 80 mg by mouth daily.    [provider]  budesonide-formoterol (SYMBICORT) 160-4.5 MCG/ACT inhaler Inhale 2 puffs into the lungs in the morning and at bedtime. 05/24/20   Rigoberto Noel, MD  calcium carbonate (OS-CAL - DOSED IN MG OF ELEMENTAL CALCIUM) 1250 (500 Ca) MG tablet Take 1 tablet by mouth daily.    [provider]  cholecalciferol (VITAMIN D3) 25 MCG (1000 UNIT) tablet Take 1,000 Units by mouth daily.    [provider]  ciprofloxacin (CIPRO) 500 MG tablet Take 500 mg by mouth 2 (two) times daily. Patient not taking: Reported on 11/05/2020 10/25/20   [provider]  clopidogrel (PLAVIX) 75 MG tablet Take 1 tablet (75 mg total) by mouth daily. 08/21/20   Verta Ellen., NP  docusate sodium (COLACE) 100 MG capsule Take 1 capsule (100 mg total) by mouth 2 (two) times daily. Patient not taking: No sig reported 12/25/19   Manuella Ghazi, Pratik D, DO  doxycycline (VIBRA-TABS) 100 MG tablet Take 100 mg by mouth 2 (two) times daily. 11/14/20   [provider]  ferrous sulfate 325 (65 FE) MG tablet Take 1 tablet (325 mg total) by mouth every other day. Patient not taking: Reported on 10/25/2020 05/03/20   Deatra James, MD  fluticasone (VERAMYST) 27.5 MCG/SPRAY nasal spray Place 2 sprays into the nose  daily as needed for rhinitis or allergies.     [provider]  gabapentin (NEURONTIN) 300 MG capsule Take 900 mg by mouth 2 (two) times daily.    [provider]  guaiFENesin-dextromethorphan (ROBITUSSIN DM) 100-10 MG/5ML syrup Take 5 mLs by mouth every 4 (four) hours as needed for cough. 05/03/20   Shahmehdi, Valeria Batman, MD  Ibuprofen-Acetaminophen (ADVIL DUAL ACTION PO) Take 1 tablet by mouth daily as needed.    [provider]  insulin aspart (NOVOLOG) 100 UNIT/ML injection Inject 0-20 Units into the skin 3 (three) times daily with meals. Patient not taking: No sig reported 05/03/20   Deatra James, MD  insulin aspart protamine- aspart (NOVOLOG MIX 70/30) (70-30) 100 UNIT/ML injection Inject 0.55 mLs (55 Units total) into the skin 2 (two) times daily with a meal. 05/03/20   Shahmehdi, Seyed A, MD  ketoconazole (NIZORAL) 2 % cream Apply 1 application topically daily. Apply twice daily for 2 weeks to the ears Patient not  taking: Reported on 11/05/2020 06/04/20   Recardo Evangelist, PA-C  losartan (COZAAR) 25 MG tablet Take 0.5 tablets (12.5 mg total) by mouth daily. 08/21/20   Verta Ellen., NP  magnesium oxide (MAG-OX) 400 MG tablet Take 400 mg by mouth daily.    [provider]  metFORMIN (GLUCOPHAGE) 1000 MG tablet Take 1,000 mg by mouth 2 (two) times daily with a meal.    [provider]  metoprolol succinate (TOPROL-XL) 25 MG 24 hr tablet Take 0.5 tablets (12.5 mg total) by mouth 2 (two) times daily. 08/21/20   Verta Ellen., NP  Multiple Vitamins-Minerals (TAB-A-VITE) TABS Take 1 tablet by mouth daily. 07/27/19   [provider]  mupirocin cream (BACTROBAN) 2 % Apply 1 application topically 2 (two) times daily. Patient not taking: No sig reported 10/25/20   Corena Herter, PA-C  omeprazole (PRILOSEC) 40 MG capsule Take 40 mg by mouth daily.    [provider]  Phenyleph-Doxylamine-DM-APAP (ALKA-SELTZER PLS ALLERGY &  CGH PO) Take 1 tablet by mouth daily as needed.    [provider]  polyethylene glycol (MIRALAX / GLYCOLAX) 17 g packet Take 17 g by mouth daily as needed for mild constipation. Patient not taking: No sig reported 12/25/19   Manuella Ghazi, Pratik D, DO  potassium chloride SA (KLOR-CON) 20 MEQ tablet Take 1 tablet (20 mEq total) by mouth daily. 08/21/20   Verta Ellen., NP  tamsulosin (FLOMAX) 0.4 MG CAPS capsule Take 0.4 mg by mouth daily.    [provider]  torsemide (DEMADEX) 20 MG tablet Take 2 tablets (40 mg total) by mouth 2 (two) times daily. May take an extra pill for 3 lbs weight gain in 24 hours or 5 lbs in one week 10/10/20 11/09/20  Arnoldo Lenis, MD  traMADol (ULTRAM) 50 MG tablet Take 1-2 tablets (50-100 mg total) by mouth every 4 (four) hours as needed for moderate pain. Patient not taking: No sig reported 03/19/20   Barrett, Erin R, PA-C  triamcinolone (KENALOG) 0.1 % Apply 1 application topically 3 (three) times daily. Patient not taking: Reported on 11/05/2020 10/15/20   Kem Parkinson, PA-C  venlafaxine XR (EFFEXOR-XR) 150 MG 24 hr capsule Take 150 mg by mouth daily with breakfast.    [provider]  zinc gluconate 50 MG tablet Take 50 mg by mouth daily.    [provider]    Physical Exam: Vitals:   11/22/20 1530 11/22/20 1600 11/22/20 1853 11/22/20 1900  BP: (!) 158/92 (!) 156/97 (!) 154/87 (!) 158/87  Pulse:  96 (!) 104 (!) 105  Resp:  17 18 18   Temp:      TempSrc:      SpO2:  99% 98% 98%  Weight:      Height:        Constitutional: NAD, calm, comfortable Vitals:   11/22/20 1530 11/22/20 1600 11/22/20 1853 11/22/20 1900  BP: (!) 158/92 (!) 156/97 (!) 154/87 (!) 158/87  Pulse:  96 (!) 104 (!) 105  Resp:  17 18 18   Temp:      TempSrc:      SpO2:  99% 98% 98%  Weight:      Height:       Eyes: PERRL, lids and conjunctivae normal ENMT: Mucous membranes are moist.   Neck: normal, supple, no masses, no  thyromegaly Respiratory: clear to auscultation bilaterally, no wheezing, no crackles. Normal respiratory effort. No accessory muscle use.  Cardiovascular: Regular rate and  rhythm, no murmurs / rubs / gallops.  1+ pitting edema to knees bilaterally. 2+ pedal pulses.  Abdomen: no tenderness, no masses palpated. No hepatosplenomegaly. Bowel sounds positive.  Musculoskeletal: no clubbing / cyanosis. No joint deformity upper and lower extremities. Good ROM, no contractures. Normal muscle tone.  Skin: Marked tenderness extending towards knees bilaterally, no appreciable differential warmth, erythema mostly around lower half of leg, sparing the feet bilaterally, worse on the right.  Medial right foot also has an area that was previously large bullae, now with black appearing eschar Neurologic:  No apparent cranial nerve abnormality, moving extremities spontaneously. Psychiatric: Normal judgment and insight. Alert and oriented x 3. Normal mood.          Labs on Admission: I have personally reviewed following labs and imaging studies  CBC: Recent Labs  Lab 11/22/20 1341  WBC 12.2*  NEUTROABS 8.2*  HGB 11.2*  HCT 35.4*  MCV 86.1  PLT 191   Basic Metabolic Panel: Recent Labs  Lab 11/22/20 1341  NA 136  K 3.9  CL 97*  CO2 29  GLUCOSE 204*  BUN 34*  CREATININE 1.04  CALCIUM 8.8*   CBG: Recent Labs  Lab 11/22/20 1346  GLUCAP 206*   Urine analysis:    Component Value Date/Time   COLORURINE YELLOW 03/05/2020 1251   APPEARANCEUR CLEAR 03/05/2020 1251   LABSPEC 1.014 03/05/2020 1251   PHURINE 7.0 03/05/2020 1251   GLUCOSEU >=500 (A) 03/05/2020 1251   HGBUR NEGATIVE 03/05/2020 1251   BILIRUBINUR NEGATIVE 03/05/2020 1251   KETONESUR NEGATIVE 03/05/2020 1251   PROTEINUR 100 (A) 03/05/2020 1251   NITRITE NEGATIVE 03/05/2020 1251   LEUKOCYTESUR NEGATIVE 03/05/2020 1251    Radiological Exams on Admission: MR FOOT RIGHT WO CONTRAST  Result Date: 11/22/2020 CLINICAL DATA:   Limping. Acute foot pain. Infection suspected. Assess for cellulitis. Nonhealing diabetic wound to the heel. EXAM: MRI OF THE RIGHT FOREFOOT WITHOUT CONTRAST TECHNIQUE: Multiplanar, multisequence MR imaging of the right was performed. No intravenous contrast was administered. COMPARISON:  None. FINDINGS: Bones/Joint/Cartilage There is no bone marrow signal abnormality. Specifically, no evidence of osteomyelitis or edema. No evidence of a fracture. No bone lesions. Ankle joint is normally spaced and aligned. No cartilage defect. No joint effusion. Subtalar and midfoot joints are also normally spaced and aligned with no visualized cartilage defect or effusion. Ligaments Medial and lateral ligament complexes are intact. Ankle synchondrosis is intact. Plantar fascia is normal in thickness and signal. Muscles and Tendons Achilles tendon, posterior tibialis tendon and peroneal tendons as well as the remaining ankle tendons are normal in signal and thickness. There is diffuse increased signal within the intrinsic foot musculature consistent with edema and/or cellulitis. Soft tissues Diffuse soft tissue high T2 signal extending from distal leg, crossing the ankle and into the foot, predominating in the foot over the dorsal aspect. This is consistent with edema, cellulitis or a combination. No focal soft tissue fluid collection to suggest an abscess. No convincing soft tissue air. IMPRESSION: 1. Abnormal increased T2 signal of the soft tissues of the distal leg, ankle and visualized foot, in the foot predominantly over the dorsum. This is consistent with edema or cellulitis. Without the benefit contrast, the presence and degree of cellulitis cannot be accurately assessed. 2. No evidence of an abscess.  No evidence of soft tissue air. 3. No bone marrow signal abnormality to suggest osteomyelitis. 4. No joint abnormality.  Intact ligaments and tendons. Electronically Signed   By: Dedra Skeens.D.  On: 11/22/2020 18:54    DG Chest Portable 1 View  Result Date: 11/22/2020 CLINICAL DATA:  60 year old male with edema. EXAM: PORTABLE CHEST 1 VIEW COMPARISON:  Chest radiograph dated 10/25/2020. FINDINGS: There is mild eventration of the right hemidiaphragm. Left lateral pleural thickening and left mid to lower lung field atelectasis/scarring similar to prior radiograph. No new consolidation. No pleural effusion pneumothorax. Stable cardiomegaly. Median sternotomy wires and CABG vascular clips. No acute osseous pathology. IMPRESSION: 1. No acute cardiopulmonary process. 2. Stable cardiomegaly. Electronically Signed   By: Anner Crete M.D.   On: 11/22/2020 16:34   DG Foot Complete Right  Result Date: 11/22/2020 CLINICAL DATA:  Cellulitis and swelling both legs/right foot wound at heel/diabetic EXAM: RIGHT FOOT COMPLETE - 3+ VIEW COMPARISON:  None. FINDINGS: No fracture or bone lesion. No bone resorption to suggest osteomyelitis. Joints are normally spaced and aligned. There are dorsal and plantar calcaneal spurs. Diffuse soft tissue swelling most evident over the dorsal forefoot. There are arterial vascular calcifications. No soft tissue air. IMPRESSION: 1. No fracture or dislocation. 2. No evidence of osteomyelitis. 3. Soft tissue swelling without evidence of soft tissue air. Electronically Signed   By: Lajean Manes M.D.   On: 11/22/2020 14:14    EKG: None.   Assessment/Plan Principal Problem:   Bilateral lower leg cellulitis Active Problems:   Hypertension   Type 2 diabetes mellitus (H[facility])   Ischemic cardiomyopathy   S/P CABG x 5   Systolic and diastolic CHF, chronic ([facility])  Bilateral lower extremity cellulitis-  With tenderness, swelling, multiple wounds, erythema, in setting of bilateral chronic lower extremity swelling, multiple areas of skin breakdown, healing wounds in different stages, failed outpatient antibiotic therapy with 3 courses of antibiotics which included doxycycline and Bactrim.  Compared to  prior pictures in epic, legs appear more erythematous today. - MRI foot without contrast suggest edema/cellulitis.  No collection no osteomyelitis.  Mild leukocytosis of 12. -IV vancomycin and ceftriaxone (to include gram-negative, as he is diabetic and at risk for multiple flora causing infection) -Possibly component of development of chronic stasis changes from chronic lower extremity swelling -Bilateral lower extremity Dopplers -Elevate feet -BMP, CBC in the morning -IV morphine 2 mg every 4 as needed -IV Lasix 40 mg x 1 -Addendum, patient tachycardic with mild leukocytosis of 12.  Will obtain lactic acid.  Hold further diuretics for now.  Hypertension-elevated. -Resume losartan, metoprolol, tamsulosin  Diabetes mellitus -random glucose 204. - HgBa1c - Resume insulin 70/30 at home dose- 55 units twice daily - SSI- M  Chronic systolic and diastolic CHF-chest x-ray unremarkable.  Weights appear stable.  Lower extremity swelling likely secondary to cellulitis. Last ECHO- 07/2020 EF 55 to [id number], grade 2 diastolic dysfunction. - Check BNP - IV Lasix 40 mg x 1 then resume home torsemide 40 twice daily - Daily weights, input output.  Ischemic cardiomyopathy/CABG x 5 -Resume metoprolol 12.5 twice daily, losartan, Plavix, atorvastatin, aspirin.  COPD-stable -DuoNebs as needed  DVT prophylaxis: Lovenox Code Status: Full code. Family Communication: None at bedside. Disposition Plan:  ~/> 2 days, pending improvement of cellulitis while on antibiotics. Consults called: none Admission status: inpt, tele I certify that at the point of admission it is my clinical judgment that the patient will require inpatient hospital care spanning beyond 2 midnights from the point of admission due to high intensity of service, high risk for further deterioration and high frequency of surveillance required.  Failed outpatient treatment requiring inpatient antibiotic therapy.   Teela Narducci E  Brystal Kildow MD [facility]  11/22/2020, 8:40 PM

## 2020-11-22 NOTE — Progress Notes (Signed)
Pharmacy Antibiotic Note  Shane Chambers is a 60 y.o. male admitted on 11/22/2020 with cellulitis who failed outpatient therapy.  Pharmacy has been consulted for vancomycin dosing.  Plan: Vancomycin loading dose 2500 mg (1 gram given @ 1530, ordered 1500 mg for now to complete loading dose). Followed by Vancomycin 1000 mg IV Q 12 hrs. Goal AUC 400-550. Expected AUC: 451.8, SCr used: 1.04 Monitor clinical progress, cultures/sensitivities, renal function, abx plan Vancomycin levels as indicated   Height: 5\' 9"  (175.3 cm) Weight: 127.5 kg (281 lb) IBW/kg (Calculated) : 70.7  Temp (24hrs), Avg:97.8 F (36.6 C), Min:97.8 F (36.6 C), Max:97.8 F (36.6 C)  Recent Labs  Lab 11/22/20 1341  WBC 12.2*  CREATININE 1.04    Estimated Creatinine Clearance: 103.5 mL/min (by C-G formula based on SCr of 1.04 mg/dL).    Allergies  Allergen Reactions  . Ace Inhibitors Swelling and Cough  . Other Itching    [name]  . Penicillins     Has patient had a PCN reaction causing immediate rash, facial/tongue/throat swelling, SOB or lightheadedness with hypotension: Unknown Has patient had a PCN reaction causing severe rash involving mucus membranes or skin necrosis: Unknown Has patient had a PCN reaction that required hospitalization: Unknown Has patient had a PCN reaction occurring within the last 10 years: Unknown If all of the above answers are "NO", then may proceed with Cephalosporin use.     Antimicrobials this admission: 1/13 vancomycin >>  1/13 Rocephin >>   Dose adjustments this admission:  Microbiology results: 1/13 Resp COV/FLU panel: negative     Thank you for allowing [place] to participate in this patients care.   Jens Som, PharmD Please see amion for complete clinical pharmacist phone list. 11/22/2020 9:17 PM

## 2020-11-22 NOTE — ED Provider Notes (Signed)
[facility] EMERGENCY DEPARTMENT Provider Note   CSN: [id number] Arrival date & time: 11/22/20  1106     History Chief Complaint  Patient presents with  . Leg Pain    Shane Chambers is a 60 y.o. male with a history of CAD, DM, htn, hyperlipidemia, CHF, copd, ischemic cardiomyopathy with last EF 20-25% EF presenting with persistent cellulitis in his bilateral lower legs in association with swelling and pustules which has not responded to multiple doses of antibiotics.  He has been treated with several rounds of bactrim, doxycycline and topical bactroban since early December 2021 with no substantial improvement in his symptoms.  He denies fevers or chills, also no unilateral leg swelling, he is sob which he endorses is baseline for him (pulse ox and resp rate normal at initial presentation).  He currently is on day 6 of a course of doxycycline with no significant improvement in his symptoms. PCP advised coming here for IV abx.   HPI     Past Medical History:  Diagnosis Date  . CAD (coronary artery disease)    a. s/p CABG in 03/2020 with LIMA-LAD, RIMA-PL, RA-D1-RI-OM1  . Cellulitis   . Depression   . Diabetes mellitus without complication ([place])   . Hyperlipidemia 12/01/2019  . Hypertension   . Ischemic cardiomyopathy    a. EF 20-25% by echo in 02/2020 b. at 40% by echo in 03/2020 c. EF normalized to 60-65% by echo in 04/2020  . Type 2 diabetes mellitus [facility])     Patient Active Problem List   Diagnosis Date Noted  . CHF exacerbation ([place]) 08/01/2020  . Visit for wound check 05/28/2020  . OSA (obstructive sleep apnea) 05/24/2020  . Acute exacerbation of chronic obstructive pulmonary disease (COPD) ([place]) 04/29/2020  . Leukocytosis 04/29/2020  . 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 Ischemic cardiomyopathy   . Coronary artery disease   . Acute on chronic combined systolic and diastolic CHF (congestive heart failure) ([place]) 03/05/2020  . Acute hyponatremia 03/05/2020  . Severe Vitamin D  deficiency 12/02/2019  . Hypokalemia 12/01/2019  . Hyperlipidemia 12/01/2019  . BPH (benign prostatic hyperplasia) 12/01/2019  . Normocytic anemia 12/01/2019  . Pneumonia due to COVID-19 virus 12/01/2019  . Hypoxia   . SOB (shortness of breath) 11/16/2019  . Hypertension   . Type 2 diabetes mellitus ([place])   . Depression   . Acute bronchitis with bronchospasm 11/15/2019    Past Surgical History:  Procedure Laterality Date  . CORONARY ARTERY BYPASS GRAFT N/A 03/13/2020   Procedure: CORONARY ARTERY BYPASS GRAFTING (CABG) times five using bilateral Internal mammary arteries and left radial artery;  Surgeon: [name], MD;  Location: [place];  Service: Open Heart Surgery;  Laterality: N/A;  . DENTAL SURGERY    . RADIAL ARTERY HARVEST Left 03/13/2020   Procedure: RADIAL ARTERY HARVEST,;  Surgeon: [name], MD;  Location: [place];  Service: Open Heart Surgery;  Laterality: Left;  . RIGHT/LEFT HEART CATH AND CORONARY ANGIOGRAPHY N/A 03/07/2020   Procedure: RIGHT/LEFT HEART CATH AND CORONARY ANGIOGRAPHY;  Surgeon: Sherren Mocha, MD;  Location: [place] CV LAB;  Service: Cardiovascular;  Laterality: N/A;  . TEE WITHOUT CARDIOVERSION N/A 03/13/2020   Procedure: TRANSESOPHAGEAL ECHOCARDIOGRAM (TEE);  Surgeon: [name], MD;  Location: [place];  Service: Open Heart Surgery;  Laterality: N/A;       Family History  Problem Relation Age of Onset  . Hypertension Mother     Social History   Tobacco  Use  . Smoking status: Former Smoker    Packs/day: 1.50    Types: Cigarettes    Quit date: 05/25/1995    Years since quitting: 25.5  . Smokeless tobacco: Never Used  Vaping Use  . Vaping Use: Never used  Substance Use Topics  . Alcohol use: Yes    Comment: rarely  . Drug use: No    Home Medications Prior to Admission medications   Medication Sig Start Date End Date Taking? Authorizing Provider  albuterol (VENTOLIN HFA) 108 (90 Base) MCG/ACT inhaler Inhale 1-2 puffs into  the lungs every 6 (six) hours as needed for wheezing or shortness of breath. 05/24/20   Rigoberto Noel, MD  aspirin 81 MG EC tablet Take 81 mg by mouth daily.  03/10/16   [provider]  Aspirin Effervescent (ALKA-SELTZER ORIGINAL PO) Take 1 tablet by mouth daily as needed.    [provider]  atorvastatin (LIPITOR) 80 MG tablet Take 80 mg by mouth daily.    [provider]  budesonide-formoterol (SYMBICORT) 160-4.5 MCG/ACT inhaler Inhale 2 puffs into the lungs in the morning and at bedtime. 05/24/20   Rigoberto Noel, MD  calcium carbonate (OS-CAL - DOSED IN MG OF ELEMENTAL CALCIUM) 1250 (500 Ca) MG tablet Take 1 tablet by mouth daily.    [provider]  cholecalciferol (VITAMIN D3) 25 MCG (1000 UNIT) tablet Take 1,000 Units by mouth daily.    [provider]  ciprofloxacin (CIPRO) 500 MG tablet Take 500 mg by mouth 2 (two) times daily. Patient not taking: Reported on 11/05/2020 10/25/20   [provider]  clopidogrel (PLAVIX) 75 MG tablet Take 1 tablet (75 mg total) by mouth daily. 08/21/20   Verta Ellen., NP  docusate sodium (COLACE) 100 MG capsule Take 1 capsule (100 mg total) by mouth 2 (two) times daily. Patient not taking: No sig reported 12/25/19   Heath Lark D, DO  ferrous sulfate 325 (65 FE) MG tablet Take 1 tablet (325 mg total) by mouth every other day. Patient not taking: Reported on 10/25/2020 05/03/20   Deatra James, MD  fluticasone (VERAMYST) 27.5 MCG/SPRAY nasal spray Place 2 sprays into the nose daily as needed for rhinitis or allergies.     [provider]  gabapentin (NEURONTIN) 300 MG capsule Take 900 mg by mouth 2 (two) times daily.    [provider]  guaiFENesin-dextromethorphan (ROBITUSSIN DM) 100-10 MG/5ML syrup Take 5 mLs by mouth every 4 (four) hours as needed for cough. 05/03/20   Shahmehdi, Valeria Batman, MD  Ibuprofen-Acetaminophen (ADVIL DUAL ACTION PO) Take 1 tablet by mouth daily as needed.     [provider]  insulin aspart (NOVOLOG) 100 UNIT/ML injection Inject 0-20 Units into the skin 3 (three) times daily with meals. Patient not taking: No sig reported 05/03/20   Deatra James, MD  insulin aspart protamine- aspart (NOVOLOG MIX 70/30) (70-30) 100 UNIT/ML injection Inject 0.55 mLs (55 Units total) into the skin 2 (two) times daily with a meal. 05/03/20   Shahmehdi, Seyed A, MD  ketoconazole (NIZORAL) 2 % cream Apply 1 application topically daily. Apply twice daily for 2 weeks to the ears Patient not taking: Reported on 11/05/2020 06/04/20   Recardo Evangelist, PA-C  losartan (COZAAR) 25 MG tablet Take 0.5 tablets (12.5 mg total) by mouth daily. 08/21/20   Verta Ellen., NP  magnesium oxide (MAG-OX) 400 MG tablet Take 400 mg by mouth daily.    [provider]  metFORMIN (GLUCOPHAGE) 1000 MG tablet Take 1,000 mg by mouth 2 (two) times daily with a meal.    [provider]  metoprolol succinate (TOPROL-XL) 25 MG 24 hr tablet Take 0.5 tablets (12.5 mg total) by mouth 2 (two) times daily. 08/21/20   Verta Ellen., NP  Multiple Vitamins-Minerals (TAB-A-VITE) TABS Take 1 tablet by mouth daily. 07/27/19   [provider]  mupirocin cream (BACTROBAN) 2 % Apply 1 application topically 2 (two) times daily. Patient not taking: No sig reported 10/25/20   Corena Herter, PA-C  omeprazole (PRILOSEC) 40 MG capsule Take 40 mg by mouth daily.    [provider]  Phenyleph-Doxylamine-DM-APAP (ALKA-SELTZER PLS ALLERGY & CGH PO) Take 1 tablet by mouth daily as needed.    [provider]  polyethylene glycol (MIRALAX / GLYCOLAX) 17 g packet Take 17 g by mouth daily as needed for mild constipation. Patient not taking: No sig reported 12/25/19   Manuella Ghazi, Pratik D, DO  potassium chloride SA (KLOR-CON) 20 MEQ tablet Take 1 tablet (20 mEq total) by mouth daily. 08/21/20   Verta Ellen., NP  tamsulosin (FLOMAX) 0.4 MG CAPS capsule Take 0.4 mg  by mouth daily.    [provider]  torsemide (DEMADEX) 20 MG tablet Take 2 tablets (40 mg total) by mouth 2 (two) times daily. May take an extra pill for 3 lbs weight gain in 24 hours or 5 lbs in one week 10/10/20 11/09/20  Arnoldo Lenis, MD  traMADol (ULTRAM) 50 MG tablet Take 1-2 tablets (50-100 mg total) by mouth every 4 (four) hours as needed for moderate pain. Patient not taking: No sig reported 03/19/20   Barrett, Erin R, PA-C  triamcinolone (KENALOG) 0.1 % Apply 1 application topically 3 (three) times daily. Patient not taking: Reported on 11/05/2020 10/15/20   Kem Parkinson, PA-C  venlafaxine XR (EFFEXOR-XR) 150 MG 24 hr capsule Take 150 mg by mouth daily with breakfast.    [provider]  zinc gluconate 50 MG tablet Take 50 mg by mouth daily.    [provider]    Allergies    Ace inhibitors, Other, and Penicillins  Review of Systems   Review of Systems  Constitutional: Negative for chills and fever.  HENT: Negative for congestion and sore throat.   Eyes: Negative.   Respiratory: Positive for shortness of breath. Negative for chest tightness.        Endorses his breathing is baseline  Cardiovascular: Positive for leg swelling. Negative for chest pain.  Gastrointestinal: Negative for abdominal pain, nausea and vomiting.  Genitourinary: Negative.   Musculoskeletal: Negative for arthralgias, joint swelling and neck pain.  Skin: Positive for wound. Negative for rash.  Neurological: Negative for dizziness, weakness, light-headedness, numbness and headaches.  Psychiatric/Behavioral: Negative.     Physical Exam Updated Vital Signs BP (!) 153/96   Pulse 95   Temp 97.8 F (36.6 C) (Oral)   Resp 18   Ht 5\' 9"  (1.753 m)   Wt 127.5 kg   SpO2 99%   BMI 41.50 kg/m   Physical Exam Vitals and nursing note reviewed.  Constitutional:      Appearance: He is well-developed and well-nourished.  HENT:     Head: Normocephalic and atraumatic.  Eyes:      Conjunctiva/sclera: Conjunctivae normal.  Cardiovascular:     Rate and Rhythm: Normal rate and regular rhythm.     Pulses: Intact distal pulses.     Heart sounds:  Normal heart sounds.  Pulmonary:     Effort: Pulmonary effort is normal.     Breath sounds: Normal breath sounds. No wheezing.  Abdominal:     General: Abdomen is protuberant. Bowel sounds are normal.     Palpations: Abdomen is soft.     Tenderness: There is no abdominal tenderness.  Musculoskeletal:        General: Normal range of motion.     Comments: Bilateral tibial edema.  Skin:    General: Skin is warm and dry.     Comments: Multiple excoriations on bilateral lowe legs, most are scabbed.  Erythema anterior tibias with no clear demarkation, multiple raised vesicles, some more papular in appearance, multiple lesions have been "deroofed" some dry base, others moist but without active purulent drainage.  Large necrotic appearing flat macular lesion right medial malleolus with dark line extending across plantar foot. This lesion is at the site of the documented bulla from his visit here 12/6.   Neurological:     Mental Status: He is alert.  Psychiatric:        Mood and Affect: Mood and affect normal.          ED Results / Procedures / Treatments   Labs (all labs ordered are listed, but only abnormal results are displayed) Labs Reviewed  CBC WITH DIFFERENTIAL/PLATELET - Abnormal; Notable for the following components:      Result Value   WBC 12.2 (*)    RBC 4.11 (*)    Hemoglobin 11.2 (*)    HCT 35.4 (*)    Neutro Abs 8.2 (*)    Monocytes Absolute 1.1 (*)    Eosinophils Absolute 0.7 (*)    Abs Immature Granulocytes 0.26 (*)    All other components within normal limits  BASIC METABOLIC PANEL - Abnormal; Notable for the following components:   Chloride 97 (*)    Glucose, Bld 204 (*)    BUN 34 (*)    Calcium 8.8 (*)    All other components within normal limits  CBG MONITORING, ED - Abnormal; Notable for  the following components:   Glucose-Capillary 206 (*)    All other components within normal limits  RESP PANEL BY RT-PCR (FLU A&B, COVID) [id number]    EKG None  Radiology DG Foot Complete Right  Result Date: 11/22/2020 CLINICAL DATA:  Cellulitis and swelling both legs/right foot wound at heel/diabetic EXAM: RIGHT FOOT COMPLETE - 3+ VIEW COMPARISON:  None. FINDINGS: No fracture or bone lesion. No bone resorption to suggest osteomyelitis. Joints are normally spaced and aligned. There are dorsal and plantar calcaneal spurs. Diffuse soft tissue swelling most evident over the dorsal forefoot. There are arterial vascular calcifications. No soft tissue air. IMPRESSION: 1. No fracture or dislocation. 2. No evidence of osteomyelitis. 3. Soft tissue swelling without evidence of soft tissue air. Electronically Signed   By: Lajean Manes M.D.   On: 11/22/2020 14:14    Procedures Procedures (including critical care time)  Medications Ordered in ED Medications  vancomycin (VANCOCIN) IVPB 1000 mg/200 mL premix (1,000 mg Intravenous New Bag/Given 11/22/20 1530)    ED Course  I have reviewed the triage vital signs and the nursing notes.  Pertinent labs & imaging results that were available during my care of the patient were reviewed by me and considered in my medical decision making (see chart for details).    MDM Rules/Calculators/A&P  Labs and imaging reviewed, foot xray negative for obvious osteomyelitis right foot, may need advanced imaging to confirm.  Cellulitis/pustular skin infection which has not responded to multiple rounds of otc oral and topical abx.  He was given an IV dose of vancomycin here.  Plan for admission.    Covid screen collected.   Pt seen by Dr. Roderic Palau who agrees with tx plan.  Discussed with Dr. Denton Brick who has seen pt in ed.  Requests MRI of right foot to further assess for osteomyelitis concerns.  Final Clinical Impression(s) / ED Diagnoses Final  diagnoses:  Cellulitis of lower extremity, unspecified laterality    Rx / DC Orders ED Discharge Orders    None       Landis Martins 11/22/20 1646    Milton Ferguson, MD 11/23/20 [phone]

## 2020-11-22 NOTE — ED Triage Notes (Signed)
Pt reports ongoing issues with cellulitis to both legs for months. Pt reports he has some open draining sores and he has been seen by PCP and was told he could go to the ED. Pt is currently on doxycycline and 3-4 days left . But doesn't seem to be improving. Legs are swollen

## 2020-11-23 ENCOUNTER — Encounter (HOSPITAL_COMMUNITY): Payer: Self-pay | Admitting: Internal Medicine

## 2020-11-23 ENCOUNTER — Inpatient Hospital Stay (HOSPITAL_COMMUNITY): Payer: Medicaid Other

## 2020-11-23 ENCOUNTER — Telehealth: Payer: Self-pay | Admitting: Pulmonary Disease

## 2020-11-23 DIAGNOSIS — R6520 Severe sepsis without septic shock: Secondary | ICD-10-CM

## 2020-11-23 DIAGNOSIS — A419 Sepsis, unspecified organism: Principal | ICD-10-CM

## 2020-11-23 DIAGNOSIS — I5042 Chronic combined systolic (congestive) and diastolic (congestive) heart failure: Secondary | ICD-10-CM

## 2020-11-23 LAB — CBC
HCT: 34.9 % — ABNORMAL LOW (ref 39.0–52.0)
Hemoglobin: 11 g/dL — ABNORMAL LOW (ref 13.0–17.0)
MCH: 27 pg (ref 26.0–34.0)
MCHC: 31.5 g/dL (ref 30.0–36.0)
MCV: 85.7 fL (ref 80.0–100.0)
Platelets: 325 10*3/uL (ref 150–400)
RBC: 4.07 MIL/uL — ABNORMAL LOW (ref 4.22–5.81)
RDW: 13.4 % (ref 11.5–15.5)
WBC: 12.2 10*3/uL — ABNORMAL HIGH (ref 4.0–10.5)
nRBC: 0 % (ref 0.0–0.2)

## 2020-11-23 LAB — BASIC METABOLIC PANEL
Anion gap: 14 (ref 5–15)
BUN: 30 mg/dL — ABNORMAL HIGH (ref 6–20)
CO2: 25 mmol/L (ref 22–32)
Calcium: 8.9 mg/dL (ref 8.9–10.3)
Chloride: 97 mmol/L — ABNORMAL LOW (ref 98–111)
Creatinine, Ser: 0.9 mg/dL (ref 0.61–1.24)
GFR, Estimated: >60 mL/min (ref >60)
Glucose, Bld: 221 mg/dL — ABNORMAL HIGH (ref 70–99)
Potassium: 3.8 mmol/L (ref 3.5–5.1)
Sodium: 136 mmol/L (ref 135–145)

## 2020-11-23 LAB — HEMOGLOBIN A1C
Hgb A1c MFr Bld: 11.1 % — ABNORMAL HIGH (ref 4.8–5.6)
Mean Plasma Glucose: 271.87 mg/dL

## 2020-11-23 LAB — GLUCOSE, CAPILLARY
Glucose-Capillary: 203 mg/dL — ABNORMAL HIGH (ref 70–99)
Glucose-Capillary: 270 mg/dL — ABNORMAL HIGH (ref 70–99)
Glucose-Capillary: 175 mg/dL — ABNORMAL HIGH (ref 70–99)
Glucose-Capillary: 150 mg/dL — ABNORMAL HIGH (ref 70–99)

## 2020-11-23 LAB — HIV ANTIBODY (ROUTINE TESTING W REFLEX): HIV Screen 4th Generation wRfx: NONREACTIVE

## 2020-11-23 IMAGING — US US EXTREM LOW VENOUS
1 series · 14 of 24 positions shown · non-contrast
Comparison: None.

CLINICAL DATA: Bilateral lower extremity swelling and cellulitis
for 2 months

EXAM:
BILATERAL LOWER EXTREMITY VENOUS DOPPLER ULTRASOUND
TECHNIQUE: Gray-scale sonography with compression, as well as color and duplex
ultrasound, were performed to evaluate the deep venous system(s)
from the level of the common femoral vein through the popliteal and
proximal calf veins.

[Series 1: us extrem low venous · 0.09mm/px · 14 of 62 slices shown]
[im 1/62]
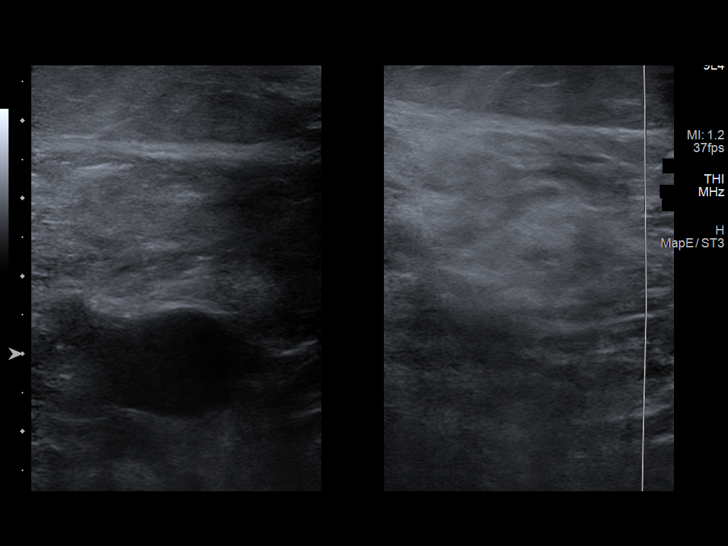
[im 6/62]
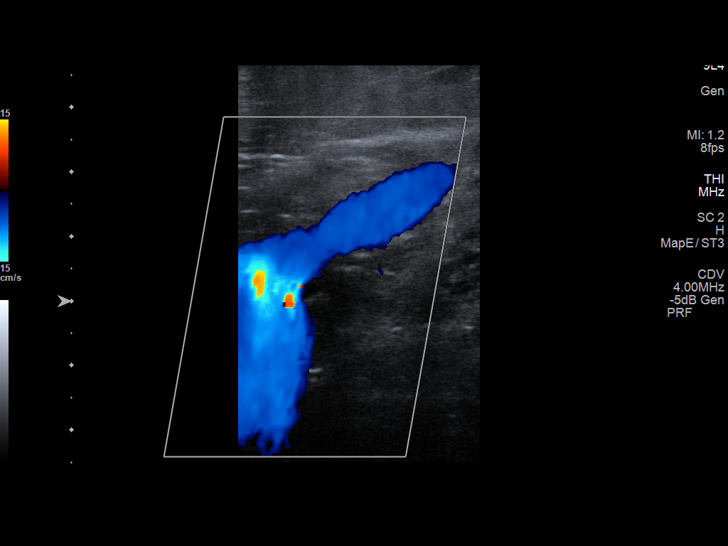
[im 11/62]
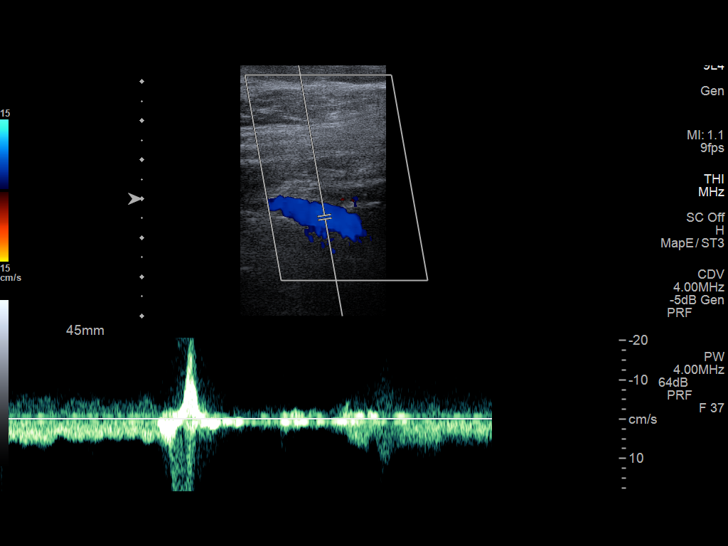
[im 16/62]
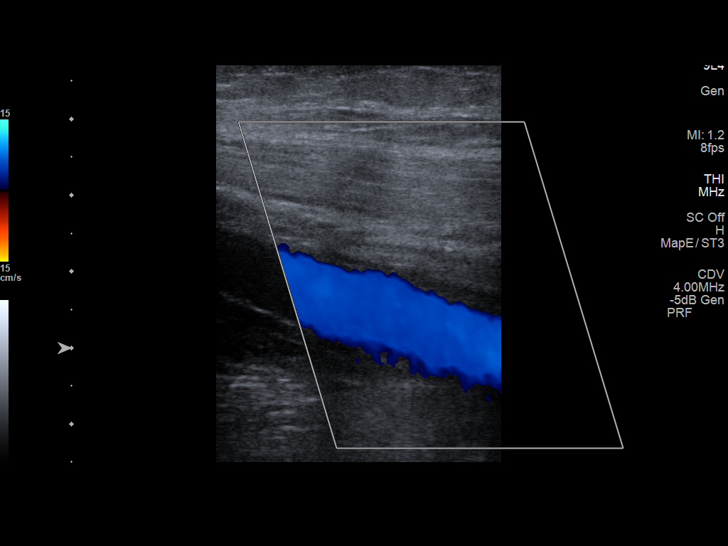
[im 19/62]
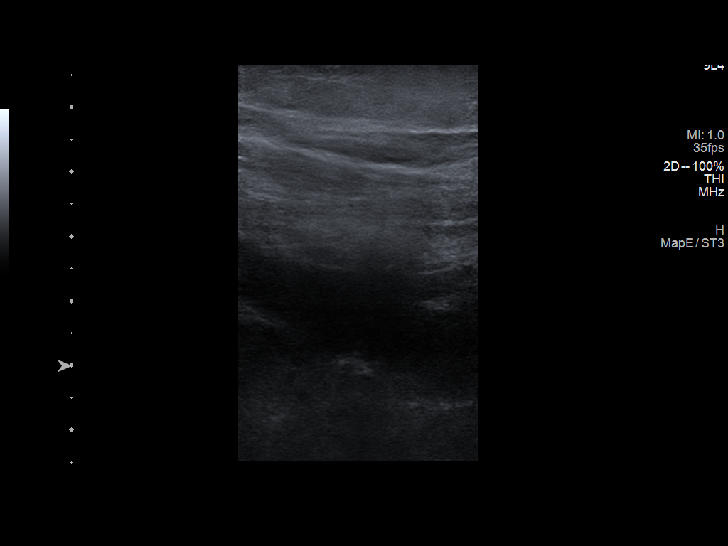
[im 24/62]
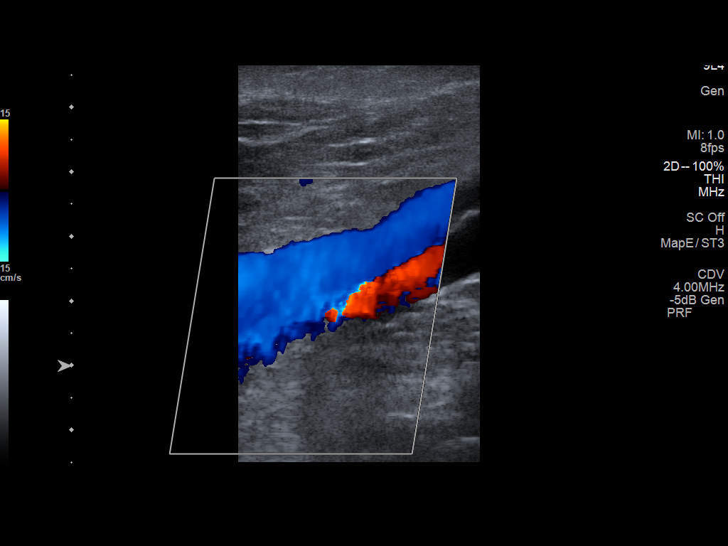
[im 30/62]
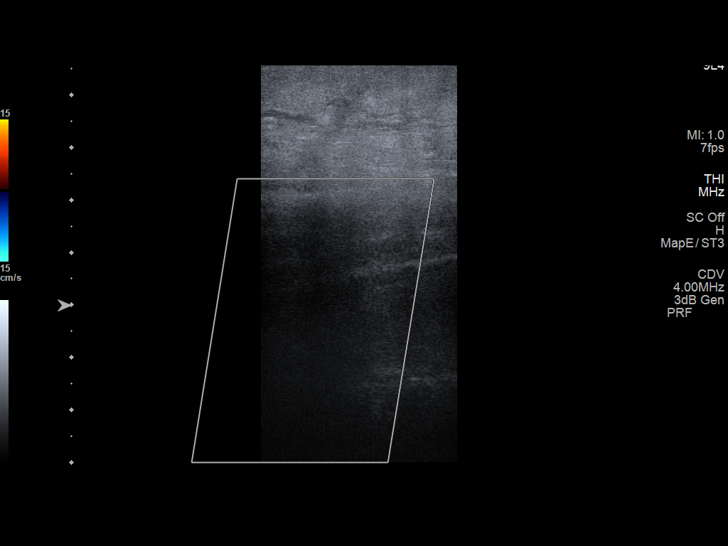
[im 32/62]
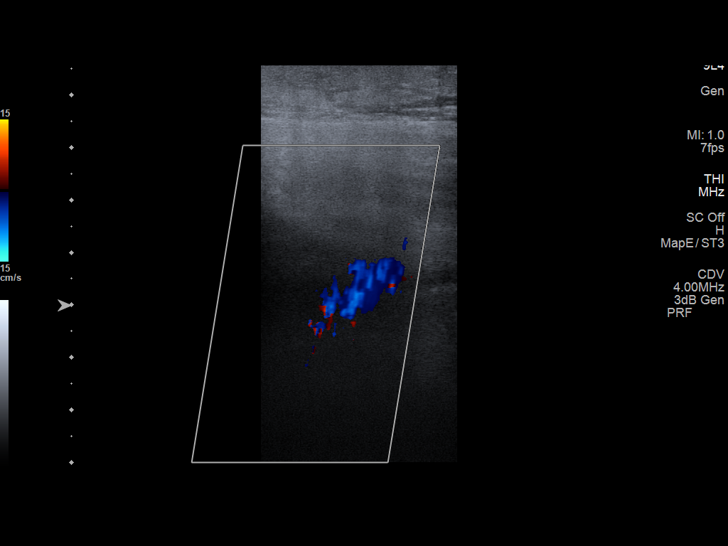
[im 38/62]
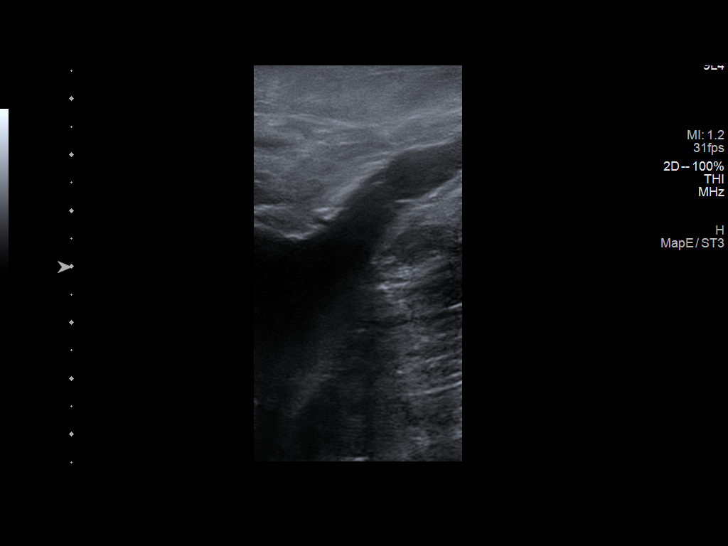
[im 43/62]
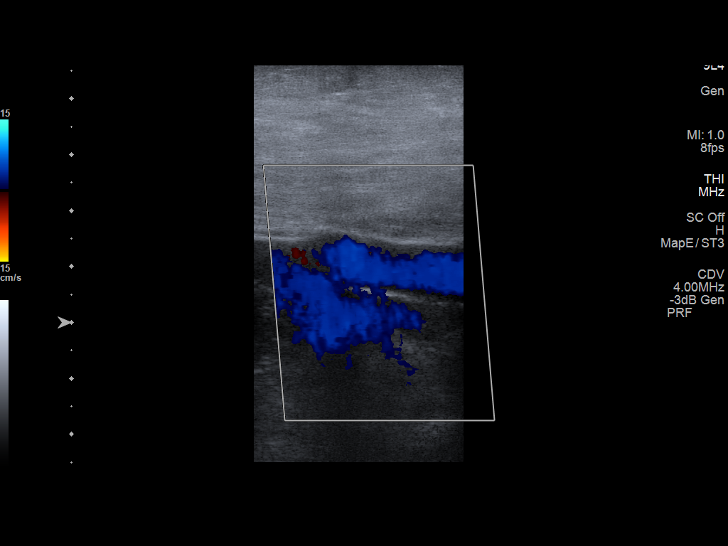
[im 48/62]
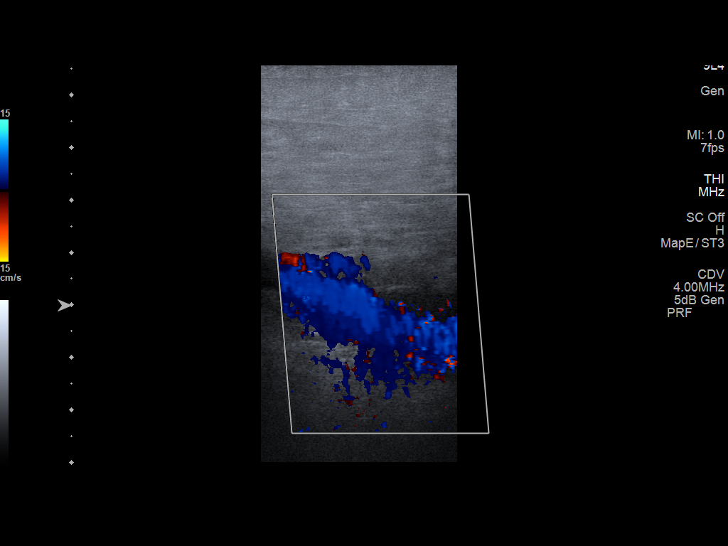
[im 51/62]
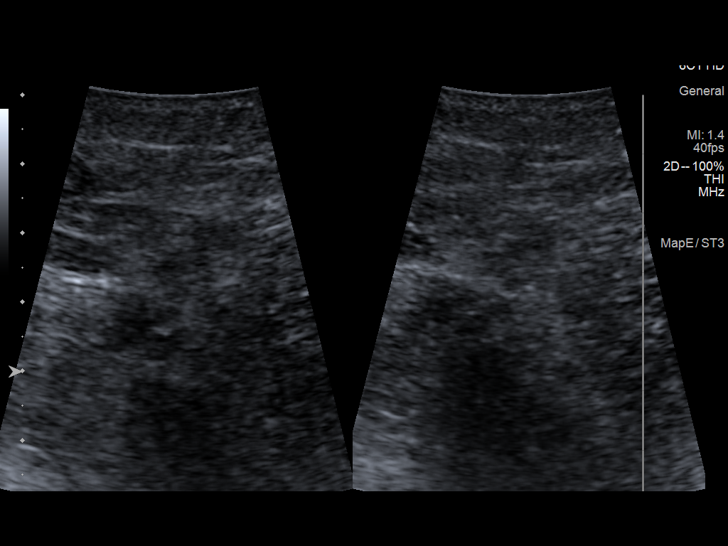
[im 56/62]
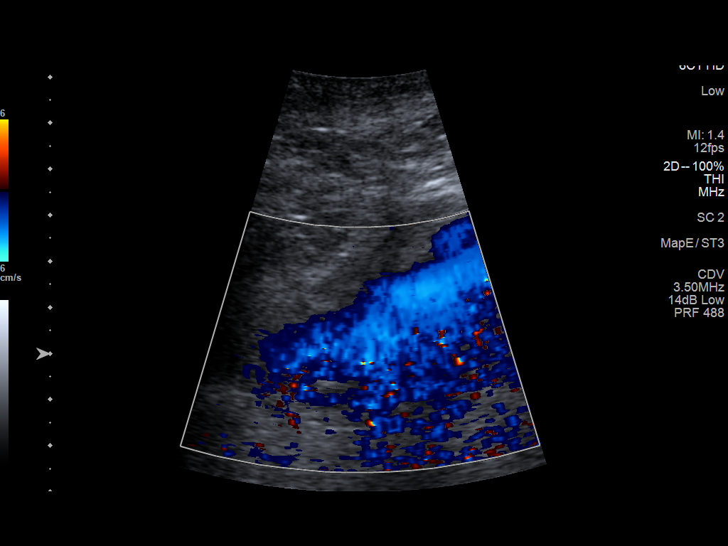
[im 62/62]
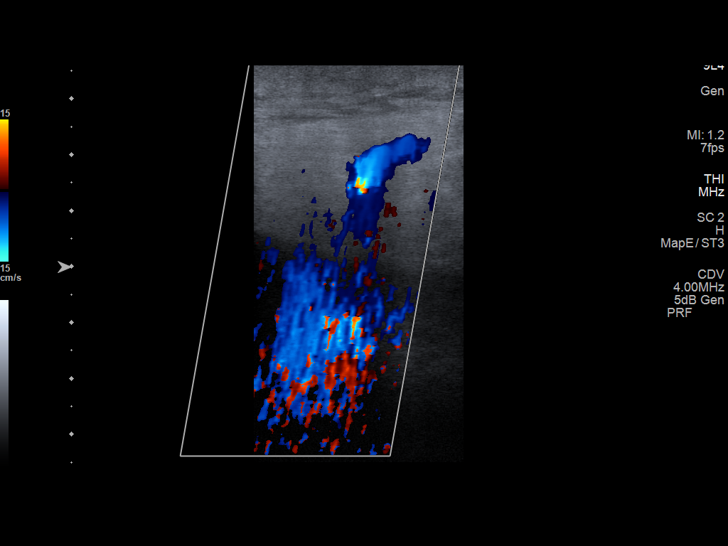

[14 of 24 positions shown; findings below may reference images not displayed]

FINDINGS: VENOUS

Normal compressibility of the common femoral, superficial femoral,
and popliteal veins, as well as the visualized calf veins.
Visualized portions of profunda femoral vein and great saphenous
vein unremarkable. No filling defects to suggest DVT on grayscale or
color Doppler imaging. Doppler waveforms show normal direction of
venous flow, normal respiratory plasticity and response to
augmentation.

OTHER

None.

Limitations: Limited visualization of the calf veins due to edema.
IMPRESSION: No lower extremity DVT

## 2020-11-23 MED ORDER — POLYETHYLENE GLYCOL 3350 17 G PO PACK
17.0000 g | PACK | Freq: Two times a day (BID) | ORAL | Status: DC
  Administered 2020-11-24 – 2020-11-29 (×10): 17 g via ORAL
  Filled 2020-11-23 (×11): qty 1

## 2020-11-23 MED ORDER — FUROSEMIDE 10 MG/ML IJ SOLN
40.0000 mg | Freq: Two times a day (BID) | INTRAMUSCULAR | Status: DC
  Administered 2020-11-23 – 2020-11-29 (×13): 40 mg via INTRAVENOUS
  Filled 2020-11-23 (×12): qty 4

## 2020-11-23 MED ORDER — LACTULOSE 10 GM/15ML PO SOLN
30.0000 g | Freq: Once | ORAL | Status: AC
  Administered 2020-11-23: 30 g via ORAL
  Filled 2020-11-23: qty 60

## 2020-11-23 NOTE — TOC Initial Note (Signed)
Transition of Care [facility]) - Initial/Assessment Note    Patient Details  Name: Shane Chambers MRN: [id number] Date of Birth: May 05, 1961  Transition of Care [facility]) CM/SW Contact:    Shade Flood, LCSW Phone Number: 11/23/2020, 3:06 PM  Clinical Narrative:                  Pt admitted from home. Pt is well known to TOC from previous admissions. Spoke with pt today to assess. Per pt, he continues to reside with his mother, sister, and niece in the mobile home he has previously lived in. Pt states that they have limited electricity and they use a small propane tank for heat. Per pt, the floor is collapsing and there are rats in the home. In the past no [place] agency would accept pt due to the conditions of the home.   Again discussed the option for referral for [facility] or SNF and pt stated, "I'm not going to go to any human warehouse. No thank you." Pt states that it is his intention to go home to his same environment and he is aware that he won't be able to get [facility] services.   TOC will make referral to [facility] and if any other possible community resources are available, will refer to those as well.  Expected Discharge Plan: Home/Self Care Barriers to Discharge: Continued Medical Work up   Patient Goals and CMS Choice        Expected Discharge Plan and Services Expected Discharge Plan: Home/Self Care In-house Referral: Clinical Social Work     Living arrangements for the past 2 months: Mobile Home                                      Prior Living Arrangements/Services Living arrangements for the past 2 months: Mobile Home Lives with:: Parents,Siblings Patient language and need for interpreter reviewed:: Yes Do you feel safe going back to the place where you live?: Yes      Need for Family Participation in Patient Care: Yes (Comment) Care giver support system in place?: Yes (comment) Current home services: DME Criminal Activity/Legal Involvement Pertinent to Current  Situation/Hospitalization: No - Comment as needed  Activities of Daily Living Home Assistive Devices/Equipment: Wheelchair,Walker (specify type),CBG Meter ADL Screening (condition at time of admission) Patient's cognitive ability adequate to safely complete daily activities?: Yes Is the patient deaf or have difficulty hearing?: No Does the patient have difficulty seeing, even when wearing glasses/contacts?: No Does the patient have difficulty concentrating, remembering, or making decisions?: No Patient able to express need for assistance with ADLs?: Yes Does the patient have difficulty dressing or bathing?: No Independently performs ADLs?: Yes (appropriate for developmental age) Does the patient have difficulty walking or climbing stairs?: Yes Weakness of Legs: Both Weakness of Arms/Hands: None  Permission Sought/Granted                  Emotional Assessment   Attitude/Demeanor/Rapport: Engaged Affect (typically observed): Pleasant Orientation: : Oriented to Self,Oriented to Place,Oriented to  Time,Oriented to Situation Alcohol / Substance Use: Not Applicable Psych Involvement: No (comment)  Admission diagnosis:  Bilateral lower leg cellulitis [L03.116, L03.115] Cellulitis of lower extremity, unspecified laterality [L03.119] Patient Active Problem List   Diagnosis Date Noted  . Bilateral lower leg cellulitis 11/22/2020  . Systolic and diastolic CHF, chronic ([place]) 11/22/2020  . CHF exacerbation ([place]) 08/01/2020  . Visit for wound  check 05/28/2020  . OSA (obstructive sleep apnea) 05/24/2020  . Acute exacerbation of chronic obstructive pulmonary disease (COPD) ([place]) 04/29/2020  . Leukocytosis 04/29/2020  . 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 Ischemic cardiomyopathy   . Coronary artery disease   . Acute on chronic combined systolic and diastolic CHF (congestive heart failure) ([place]) 03/05/2020  . Acute hyponatremia 03/05/2020  . Severe Vitamin D deficiency 12/02/2019  . Hypokalemia  12/01/2019  . Hyperlipidemia 12/01/2019  . BPH (benign prostatic hyperplasia) 12/01/2019  . Normocytic anemia 12/01/2019  . Pneumonia due to COVID-19 virus 12/01/2019  . Hypoxia   . SOB (shortness of breath) 11/16/2019  . Hypertension   . Type 2 diabetes mellitus ([place])   . Depression   . Acute bronchitis with bronchospasm 11/15/2019   PCP:  [name], MD Pharmacy:   [id number] - [place], [address] [place] [address] Phone: [phone] Fax: [phone]     Social Determinants of Health (SDOH) Interventions    Readmission Risk Interventions Readmission Risk Prevention Plan 11/23/2020 08/02/2020 03/07/2020  Transportation Screening Complete Complete Complete  PCP or Specialist Appt within 3-5 Days - - -  HRI or [place] Work Consult for [place] - - -  Medication Review Press photographer) - Complete Complete  HRI or [place] - Complete -  SW Recovery Care/Counseling Consult Complete Complete -  Palliative Care Screening Not Applicable Not Applicable -  [place] Patient Refused Not Applicable -  Some recent data might be hidden

## 2020-11-23 NOTE — Progress Notes (Addendum)
Patient Demographics:    Shane Chambers, is a 60 y.o. male, DOB - 11/26/1960, TDD:[id number]  Admit date - 11/22/2020   Admitting Physician Ejiroghene Arlyce Dice, MD  Outpatient Primary MD for the patient is [name], MD  LOS - 1   Chief Complaint  Patient presents with  . Leg Pain        Subjective:    [name] today has no fevers, no emesis,  No chest pain,   No fever  Or chills  Complains of constipation -Complains of dyspnea on exertion  Assessment  & Plan :    Principal Problem:   Sepsis ([place]) Active Problems:   Hypertension   Type 2 diabetes mellitus (HCC)   Ischemic cardiomyopathy   S/P CABG x 5   Bilateral lower leg cellulitis   Systolic and diastolic CHF, chronic (HCC)  Brief Summary:- 60 y.o. male with medical history significant for systolic and diastolic CHF, CABG x 5, COPD, diabetes mellitus, hypertension, prior COVID-19 infection (11/2019) admitted on 11/23/2019 with bilateral lower extremity cellulitis  A/p  1) sepsis secondary to bilateral lower extremity cellulitis without osteomyelitis---  please see photos in epic -Patient met sepsis criteria on admission with leukocytosis, tachycardia and tachypnea in the setting of cellulitis  -Previously failed oral antibiotics as outpatient  -WBCs 12.2 (patient appears to have some degree of chronic leukocytosis) - continue Rocephin and vancomycin (patient had MRSA positive PCR screening back in May 2021 and again in September 2021  2)DM2-A1c is 11.1 reflecting uncontrolled DM with hyperglycemia PTA -Continue 70/30 insulin at 55 units twice daily along with sliding scale coverage  3)HTN--stable, continue metoprolol 12.5 mg twice daily and losartan 12.5 mg daily  4)BPH--stable, continue Flomax  5)COPD--- no acute exacerbation at this time, continue Dulera  6) CAD-- s/p Prior CABG--stable, continue aspirin and Lipitor, as  well as metoprolol  7)HFpEF--- patient with chronic diastolic dysfunction CHF, Echo with EF of 55 to 60%, and grade 2 diastolic dysfunction -Lower extremity edema noted, some dyspnea on exertion noted, IV fluids as ordered  Disposition/Need for in-Hospital Stay- patient unable to be discharged at this time due to sepsis secondary to cellulitis, failure of oral antibiotics as outpatient requires IV antibiotics  Status is: Inpatient  Remains inpatient appropriate because:Please see above   Disposition: The patient is from: Home              Anticipated d/c is to: Home              Anticipated d/c date is: 3 days              Patient currently is not medically stable to d/c. Barriers: Not Clinically Stable-   Code Status :  -  Code Status: Full Code   Family Communication:    NA (patient is alert, awake and coherent)  Consults  :  na  DVT Prophylaxis  :   - SCDs      Lab Results  Component Value Date   PLT 325 11/23/2020    Inpatient Medications  Scheduled Meds: . aspirin EC  81 mg Oral Daily  . atorvastatin  80 mg Oral Daily  . enoxaparin (LOVENOX) injection  60 mg Subcutaneous Q24H  . furosemide  40 mg Intravenous Q12H  . gabapentin  900 mg Oral BID  . insulin aspart  0-15 Units Subcutaneous TID WC  . insulin aspart  0-5 Units Subcutaneous QHS  . insulin aspart protamine- aspart  55 Units Subcutaneous BID WC  . losartan  12.5 mg Oral Daily  . metoprolol succinate  12.5 mg Oral BID  . mometasone-formoterol  2 puff Inhalation BID  . pantoprazole  40 mg Oral Daily  . polyethylene glycol  17 g Oral BID  . tamsulosin  0.4 mg Oral Daily  . venlafaxine XR  150 mg Oral Q breakfast   Continuous Infusions: . cefTRIAXone (ROCEPHIN)  IV 2 g (11/22/20 2110)  . vancomycin Stopped (11/23/20 1313)   PRN Meds:.acetaminophen **OR** acetaminophen, ipratropium-albuterol, ondansetron **OR** ondansetron (ZOFRAN) IV    Anti-infectives (From admission, onward)   Start     Dose/Rate  Route Frequency Ordered Stop   11/23/20 1000  vancomycin (VANCOCIN) IVPB 1000 mg/200 mL premix        1,000 mg 200 mL/hr over 60 Minutes Intravenous Every 12 hours 11/22/20 2121     11/22/20 2115  vancomycin (VANCOREADY) IVPB 1500 mg/300 mL        1,500 mg 150 mL/hr over 120 Minutes Intravenous  Once 11/22/20 2112 11/23/20 0112   11/22/20 2045  cefTRIAXone (ROCEPHIN) 2 g in sodium chloride 0.9 % 100 mL IVPB        2 g 200 mL/hr over 30 Minutes Intravenous Every 24 hours 11/22/20 2037     11/22/20 1515  vancomycin (VANCOCIN) IVPB 1000 mg/200 mL premix        1,000 mg 200 mL/hr over 60 Minutes Intravenous  Once 11/22/20 1513 11/22/20 1630        Objective:   Vitals:   11/23/20 0638 11/23/20 0833 11/23/20 0840 11/23/20 1349  BP: 116/74   133/69  Pulse: (!) 106   (!) 101  Resp: 18   20  Temp: 97.6 F (36.4 C)   98.1 F (36.7 C)  TempSrc: Oral   Oral  SpO2: 97% 95% 100% 99%  Weight: 133.3 kg     Height:        Wt Readings from Last 3 Encounters:  11/23/20 133.3 kg  11/05/20 129.7 kg  10/15/20 129.7 kg     Intake/Output Summary (Last 24 hours) at 11/23/2020 1926 Last data filed at 11/23/2020 1518 Gross per 24 hour  Intake 300.61 ml  Output 800 ml  Net -499.39 ml    Physical Exam  Gen:- Awake Alert,  Obese, no apparent distress  HEENT:- [place].AT, No sclera icterus Neck-Supple Neck,No JVD,.  Lungs-diminished in bases, no wheezing  CV- S1, S2 normal, regular  Abd-  +ve B.Sounds, Abd Soft, No tenderness,    Extremity/Skin:-  pedal pulses present , edema and cellulitis please see photos in epic Psych-affect is appropriate, oriented x3 Neuro-generalized weakness, no new focal deficits, no tremors   Data Review:   Micro Results Recent Results (from the past 240 hour(s))  Resp Panel by RT-PCR (Flu A&B, Covid) Nasopharyngeal Swab     Status: None   Collection Time: 11/22/20  3:15 PM   Specimen: Nasopharyngeal Swab; Nasopharyngeal(NP) swabs in vial transport medium   Result Value Ref Range Status   SARS Coronavirus 2 by RT PCR NEGATIVE NEGATIVE Final    Comment: (NOTE) SARS-CoV-2 target nucleic acids are NOT DETECTED.  The SARS-CoV-2 RNA is generally detectable in upper respiratory specimens during the acute phase of infection. The lowest concentration of  SARS-CoV-2 viral copies this assay can detect is 138 copies/mL. A negative result does not preclude SARS-Cov-2 infection and should not be used as the sole basis for treatment or other patient management decisions. A negative result may occur with  improper specimen collection/handling, submission of specimen other than nasopharyngeal swab, presence of viral mutation(s) within the areas targeted by this assay, and inadequate number of viral copies(<138 copies/mL). A negative result must be combined with clinical observations, patient history, and epidemiological information. The expected result is Negative.  Fact Sheet for Patients:  EntrepreneurPulse.com.au  Fact Sheet for Healthcare Providers:  IncredibleEmployment.be  This test is no t yet approved or cleared by the [place] FDA and  has been authorized for detection and/or diagnosis of SARS-CoV-2 by FDA under an Emergency Use Authorization (EUA). This EUA will remain  in effect (meaning this test can be used) for the duration of the COVID-19 declaration under Section 564(b)(1) of the Act, 21 U.S.C.section 360bbb-3(b)(1), unless the authorization is terminated  or revoked sooner.       Influenza A by PCR NEGATIVE NEGATIVE Final   Influenza B by PCR NEGATIVE NEGATIVE Final    Comment: (NOTE) The Xpert Xpress SARS-CoV-2/FLU/RSV plus assay is intended as an aid in the diagnosis of influenza from Nasopharyngeal swab specimens and should not be used as a sole basis for treatment. Nasal washings and aspirates are unacceptable for Xpert Xpress SARS-CoV-2/FLU/RSV testing.  Fact Sheet for  Patients: EntrepreneurPulse.com.au  Fact Sheet for Healthcare Providers: IncredibleEmployment.be  This test is not yet approved or cleared by the [place] FDA and has been authorized for detection and/or diagnosis of SARS-CoV-2 by FDA under an Emergency Use Authorization (EUA). This EUA will remain in effect (meaning this test can be used) for the duration of the COVID-19 declaration under Section 564(b)(1) of the Act, 21 U.S.C. section 360bbb-3(b)(1), unless the authorization is terminated or revoked.  Performed at [facility], [address] [place],  [address]     Radiology Reports DG Chest 1 View  Result Date: 10/25/2020 CLINICAL DATA:  Shortness of breath and leg swelling EXAM: CHEST  1 VIEW COMPARISON:  10/15/2020 FINDINGS: Post sternotomy changes. Similar elevation of right diaphragm. Mild cardiomegaly with aortic atherosclerosis. No pneumothorax. IMPRESSION: Cardiomegaly. Electronically Signed   By: Donavan Foil M.D.   On: 10/25/2020 18:35   MR FOOT RIGHT WO CONTRAST  Result Date: 11/22/2020 CLINICAL DATA:  [place]imp[place]. Acute foot pain. Infection suspected. Assess for cellulitis. Nonhealing diabetic wound to the heel. EXAM: MRI OF THE RIGHT FOREFOOT WITHOUT CONTRAST TECHNIQUE: Multiplanar, multisequence MR imaging of the right was performed. No intravenous contrast was administered. COMPARISON:  None. FINDINGS: Bones/Joint/Cartilage There is no bone marrow signal abnormality. Specifically, no evidence of osteomyelitis or edema. No evidence of a fracture. No bone lesions. Ankle joint is normally spaced and aligned. No cartilage defect. No joint effusion. Subtalar and midfoot joints are also normally spaced and aligned with no visualized cartilage defect or effusion. Ligaments Medial and lateral ligament complexes are intact. Ankle synchondrosis is intact. Plantar fascia is normal in thickness and signal. Muscles and Tendons Achilles  tendon, posterior tibialis tendon and peroneal tendons as well as the remaining ankle tendons are normal in signal and thickness. There is diffuse increased signal within the intrinsic foot musculature consistent with edema and/or cellulitis. Soft tissues Diffuse soft tissue high T2 signal extending from distal leg, crossing the ankle and into the foot, predominating in the foot over the dorsal aspect. This is consistent  with edema, cellulitis or a combination. No focal soft tissue fluid collection to suggest an abscess. No convincing soft tissue air. IMPRESSION: 1. Abnormal increased T2 signal of the soft tissues of the distal leg, ankle and visualized foot, in the foot predominantly over the dorsum. This is consistent with edema or cellulitis. Without the benefit contrast, the presence and degree of cellulitis cannot be accurately assessed. 2. No evidence of an abscess.  No evidence of soft tissue air. 3. No bone marrow signal abnormality to suggest osteomyelitis. 4. No joint abnormality.  Intact ligaments and tendons. Electronically Signed   By: Lajean Manes M.D.   On: 11/22/2020 18:54   US Venous Img Lower Bilateral (DVT)  Result Date: 11/23/2020 CLINICAL DATA:  Bilateral lower extremity swelling and cellulitis for 2 months EXAM: BILATERAL LOWER EXTREMITY VENOUS DOPPLER ULTRASOUND TECHNIQUE: Gray-scale sonography with compression, as well as color and duplex ultrasound, were performed to evaluate the deep venous system(s) from the level of the common femoral vein through the popliteal and proximal calf veins. COMPARISON:  None. FINDINGS: VENOUS Normal compressibility of the common femoral, superficial femoral, and popliteal veins, as well as the visualized calf veins. Visualized portions of profunda femoral vein and great saphenous vein unremarkable. No filling defects to suggest DVT on grayscale or color Doppler imaging. Doppler waveforms show normal direction of venous flow, normal respiratory plasticity  and response to augmentation. OTHER None. Limitations: Limited visualization of the calf veins due to edema. IMPRESSION: No lower extremity DVT Electronically Signed   By: Miachel Roux M.D.   On: 11/23/2020 10:33   DG Chest Portable 1 View  Result Date: 11/22/2020 CLINICAL DATA:  60 year old male with edema. EXAM: PORTABLE CHEST 1 VIEW COMPARISON:  Chest radiograph dated 10/25/2020. FINDINGS: There is mild eventration of the right hemidiaphragm. Left lateral pleural thickening and left mid to lower lung field atelectasis/scarring similar to prior radiograph. No new consolidation. No pleural effusion pneumothorax. Stable cardiomegaly. Median sternotomy wires and CABG vascular clips. No acute osseous pathology. IMPRESSION: 1. No acute cardiopulmonary process. 2. Stable cardiomegaly. Electronically Signed   By: Anner Crete M.D.   On: 11/22/2020 16:34   DG Foot Complete Right  Result Date: 11/22/2020 CLINICAL DATA:  Cellulitis and swelling both legs/right foot wound at heel/diabetic EXAM: RIGHT FOOT COMPLETE - 3+ VIEW COMPARISON:  None. FINDINGS: No fracture or bone lesion. No bone resorption to suggest osteomyelitis. Joints are normally spaced and aligned. There are dorsal and plantar calcaneal spurs. Diffuse soft tissue swelling most evident over the dorsal forefoot. There are arterial vascular calcifications. No soft tissue air. IMPRESSION: 1. No fracture or dislocation. 2. No evidence of osteomyelitis. 3. Soft tissue swelling without evidence of soft tissue air. Electronically Signed   By: Lajean Manes M.D.   On: 11/22/2020 14:14   DG Foot Complete Right  Result Date: 11/05/2020 CLINICAL DATA:  Pain, blister. EXAM: RIGHT FOOT COMPLETE - 3+ VIEW COMPARISON:  None. FINDINGS: There is no evidence of fracture or dislocation. Slight hammertoe deformity of the digits. No erosion or bony destruction. Degenerative change in the mid and hindfoot most prominent at the talonavicular joint. Plantar calcaneal  spur and Achilles tendon enthesophyte. Generalized soft tissue edema, prominent over the dorsum of the foot. There is also a rounded soft tissue density projecting over the plantar aspect of the calcaneus, indeterminate. No soft tissue air. No radiopaque foreign body. There are vascular calcifications. IMPRESSION: 1. Generalized soft tissue edema. No acute osseous abnormality. 2. Rounded soft tissue density  projecting over the plantar aspect of the calcaneus, indeterminate, may represent site of reported blister. Recommend correlation with physical exam. 3. Degenerative change in the mid and hindfoot. Plantar calcaneal spur and Achilles tendon enthesophyte. Electronically Signed   By: Keith Rake M.D.   On: 11/05/2020 21:10     CBC Recent Labs  Lab 11/22/20 1341 11/23/20 0507  WBC 12.2* 12.2*  HGB 11.2* 11.0*  HCT 35.4* 34.9*  PLT 324 325  MCV 86.1 85.7  MCH 27.3 27.0  MCHC 31.6 31.5  RDW 13.4 13.4  LYMPHSABS 1.9  --   MONOABS 1.1*  --   EOSABS 0.7*  --   BASOSABS 0.1  --     Chemistries  Recent Labs  Lab 11/22/20 1341 11/23/20 0507  NA 136 136  K 3.9 3.8  CL 97* 97*  CO2 29 25  GLUCOSE 204* 221*  BUN 34* 30*  CREATININE 1.04 0.90  CALCIUM 8.8* 8.9   ------------------------------------------------------------------------------------------------------------------ No results for input(s): CHOL, HDL, LDLCALC, TRIG, CHOLHDL, LDLDIRECT in the last 72 hours.  Lab Results  Component Value Date   HGBA1C 11.1 (H) 11/23/2020   ------------------------------------------------------------------------------------------------------------------ No results for input(s): TSH, T4TOTAL, T3FREE, THYROIDAB in the last 72 hours.  Invalid input(s): FREET3 ------------------------------------------------------------------------------------------------------------------ No results for input(s): VITAMINB12, FOLATE, FERRITIN, TIBC, IRON, RETICCTPCT in the last 72 hours.  Coagulation  profile No results for input(s): INR, PROTIME in the last 168 hours.  No results for input(s): DDIMER in the last 72 hours.  Cardiac Enzymes No results for input(s): CKMB, TROPONINI, MYOGLOBIN in the last 168 hours.  Invalid input(s): CK ------------------------------------------------------------------------------------------------------------------    Component Value Date/Time   BNP 65.0 11/22/2020 1341     Danisa Kopec M.D on 11/23/2020 at 7:26 PM  Go to [email or web address] - for contact info  [facility] - Office  [phone]

## 2020-11-23 NOTE — Telephone Encounter (Signed)
11/23/2020  Attempted to contact patient.  Left voicemail.  Patient can reschedule office visit when he returns telephone call.  Patient needs to have 6 to 8-week follow-up after starting CPAP therapy.  We will leave in triage for additional call on 11/26/2020 as per protocol   Wyn Quaker, FNP

## 2020-11-24 LAB — GLUCOSE, CAPILLARY
Glucose-Capillary: 123 mg/dL — ABNORMAL HIGH (ref 70–99)
Glucose-Capillary: 210 mg/dL — ABNORMAL HIGH (ref 70–99)

## 2020-11-24 MED ORDER — LACTULOSE 10 GM/15ML PO SOLN
30.0000 g | Freq: Once | ORAL | Status: AC
  Administered 2020-11-24: 30 g via ORAL
  Filled 2020-11-24: qty 60

## 2020-11-24 MED ORDER — OXYCODONE HCL 5 MG PO TABS
5.0000 mg | ORAL_TABLET | Freq: Two times a day (BID) | ORAL | Status: DC | PRN
  Administered 2020-11-24 – 2020-11-28 (×9): 5 mg via ORAL
  Filled 2020-11-24 (×10): qty 1

## 2020-11-24 NOTE — Progress Notes (Signed)
Patient Demographics:    Shane Chambers, is a 60 y.o. male, DOB - May 30, 1961, GEX:[id number]  Admit date - 11/22/2020   Admitting Physician Ejiroghene Arlyce Dice, MD  Outpatient Primary MD for the patient is [name], MD  LOS - 2   Chief Complaint  Patient presents with  . Leg Pain        Subjective:    Shane Chambers today has no fevers, no emesis,  No chest pain,   No fever  Or chills  -Continues to have difficulty with BM  Assessment  & Plan :    Principal Problem:   Sepsis ([place]) Active Problems:   Hypertension   Type 2 diabetes mellitus (HCC)   Ischemic cardiomyopathy   S/P CABG x 5   Bilateral lower leg cellulitis   Systolic and diastolic CHF, chronic (HCC)  Brief Summary:- 60 y.o. male with medical history significant for systolic and diastolic CHF, CABG x 5, COPD, diabetes mellitus, hypertension, prior COVID-19 infection (11/2019) admitted on 11/23/2019 with bilateral lower extremity cellulitis  A/p  1) sepsis secondary to bilateral lower extremity cellulitis without osteomyelitis---  please see photos in epic -Patient met sepsis criteria on admission with leukocytosis, tachycardia and tachypnea in the setting of cellulitis  -Previously failed oral antibiotics as outpatient  -WBCs 12.2 (patient appears to have some degree of chronic leukocytosis) - continue Rocephin and vancomycin (patient had MRSA positive PCR screening back in May 2021 and again in September 2021)  2)DM2-A1c is 11.1 reflecting uncontrolled DM with hyperglycemia PTA -Continue 70/30 insulin at 55 units twice daily along with sliding scale coverage  3)HTN--stable, continue metoprolol 12.5 mg twice daily and losartan 12.5 mg daily  4)BPH--stable, continue Flomax  5)COPD--- no acute exacerbation at this time, continue Dulera  6) CAD-- s/p Prior CABG--stable, continue aspirin and Lipitor, as well as  metoprolol  7)HFpEF--- patient with chronic diastolic dysfunction CHF, Echo with EF of 55 to 60%, and grade 2 diastolic dysfunction -Lower extremity edema noted, some dyspnea on exertion noted, IV fluids as ordered  8) generalized weakness and ambulatory dysfunction----get PT eval, will most likely need SNF rehab  Disposition/Need for in-Hospital Stay- patient unable to be discharged at this time due to sepsis secondary to cellulitis, failure of oral antibiotics as outpatient requires IV antibiotics  Status is: Inpatient  Remains inpatient appropriate because:Please see above   Disposition: The patient is from: Home              Anticipated d/c is to: Home  Vs SNF              Anticipated d/c date is: 3 days              Patient currently is not medically stable to d/c. Barriers: Not Clinically Stable-   Code Status :  -  Code Status: Full Code   Family Communication:    NA (patient is alert, awake and coherent)  Consults  :  na  DVT Prophylaxis  :   - SCDs      Lab Results  Component Value Date   PLT 325 11/23/2020    Inpatient Medications  Scheduled Meds: . aspirin EC  81 mg Oral Daily  . atorvastatin  80 mg  Oral Daily  . enoxaparin (LOVENOX) injection  60 mg Subcutaneous Q24H  . furosemide  40 mg Intravenous BID  . gabapentin  900 mg Oral BID  . insulin aspart  0-15 Units Subcutaneous TID WC  . insulin aspart  0-5 Units Subcutaneous QHS  . insulin aspart protamine- aspart  55 Units Subcutaneous BID WC  . losartan  12.5 mg Oral Daily  . metoprolol succinate  12.5 mg Oral BID  . mometasone-formoterol  2 puff Inhalation BID  . pantoprazole  40 mg Oral Daily  . polyethylene glycol  17 g Oral BID  . tamsulosin  0.4 mg Oral Daily  . venlafaxine XR  150 mg Oral Q breakfast   Continuous Infusions: . cefTRIAXone (ROCEPHIN)  IV Stopped (11/23/20 2109)  . vancomycin 1,000 mg (11/24/20 0803)   PRN Meds:.acetaminophen **OR** acetaminophen, ipratropium-albuterol,  ondansetron **OR** ondansetron (ZOFRAN) IV, oxyCODONE    Anti-infectives (From admission, onward)   Start     Dose/Rate Route Frequency Ordered Stop   11/23/20 1000  vancomycin (VANCOCIN) IVPB 1000 mg/200 mL premix        1,000 mg 200 mL/hr over 60 Minutes Intravenous Every 12 hours 11/22/20 2121     11/22/20 2115  vancomycin (VANCOREADY) IVPB 1500 mg/300 mL        1,500 mg 150 mL/hr over 120 Minutes Intravenous  Once 11/22/20 2112 11/23/20 0112   11/22/20 2045  cefTRIAXone (ROCEPHIN) 2 g in sodium chloride 0.9 % 100 mL IVPB        2 g 200 mL/hr over 30 Minutes Intravenous Every 24 hours 11/22/20 2037     11/22/20 1515  vancomycin (VANCOCIN) IVPB 1000 mg/200 mL premix        1,000 mg 200 mL/hr over 60 Minutes Intravenous  Once 11/22/20 1513 11/22/20 1630        Objective:   Vitals:   11/24/20 0542 11/24/20 0741 11/24/20 1142 11/24/20 1419  BP: (!) 143/80   124/78  Pulse: 95   98  Resp: 20     Temp: 98.2 F (36.8 C)   98.8 F (37.1 C)  TempSrc:    Oral  SpO2: 98% 94% 98% 100%  Weight: (!) 137.8 kg     Height:        Wt Readings from Last 3 Encounters:  11/24/20 (!) 137.8 kg  11/05/20 129.7 kg  10/15/20 129.7 kg     Intake/Output Summary (Last 24 hours) at 11/24/2020 1650 Last data filed at 11/24/2020 1300 Gross per 24 hour  Intake 820 ml  Output 2000 ml  Net -1180 ml    Physical Exam  Gen:- Awake Alert,  Obese, no apparent distress  HEENT:- [place].AT, No sclera icterus Neck-Supple Neck,No JVD,.  Lungs-diminished in bases, no wheezing  CV- S1, S2 normal, regular  Abd-  +ve B.Sounds, Abd Soft, No tenderness,    Extremity/Skin:-  pedal pulses present , edema and cellulitis please see photos in epic Psych-affect is appropriate, oriented x3 Neuro-generalized weakness, no new focal deficits, no tremors   Data Review:   Micro Results Recent Results (from the past 240 hour(s))  Resp Panel by RT-PCR (Flu A&B, Covid) Nasopharyngeal Swab     Status: None    Collection Time: 11/22/20  3:15 PM   Specimen: Nasopharyngeal Swab; Nasopharyngeal(NP) swabs in vial transport medium  Result Value Ref Range Status   SARS Coronavirus 2 by RT PCR NEGATIVE NEGATIVE Final    Comment: (NOTE) SARS-CoV-2 target nucleic acids are NOT DETECTED.  The SARS-CoV-2  RNA is generally detectable in upper respiratory specimens during the acute phase of infection. The lowest concentration of SARS-CoV-2 viral copies this assay can detect is 138 copies/mL. A negative result does not preclude SARS-Cov-2 infection and should not be used as the sole basis for treatment or other patient management decisions. A negative result may occur with  improper specimen collection/handling, submission of specimen other than nasopharyngeal swab, presence of viral mutation(s) within the areas targeted by this assay, and inadequate number of viral copies(<138 copies/mL). A negative result must be combined with clinical observations, patient history, and epidemiological information. The expected result is Negative.  Fact Sheet for Patients:  EntrepreneurPulse.com.au  Fact Sheet for Healthcare Providers:  IncredibleEmployment.be  This test is no t yet approved or cleared by the [place] FDA and  has been authorized for detection and/or diagnosis of SARS-CoV-2 by FDA under an Emergency Use Authorization (EUA). This EUA will remain  in effect (meaning this test can be used) for the duration of the COVID-19 declaration under Section 564(b)(1) of the Act, 21 U.S.C.section 360bbb-3(b)(1), unless the authorization is terminated  or revoked sooner.       Influenza A by PCR NEGATIVE NEGATIVE Final   Influenza B by PCR NEGATIVE NEGATIVE Final    Comment: (NOTE) The Xpert Xpress SARS-CoV-2/FLU/RSV plus assay is intended as an aid in the diagnosis of influenza from Nasopharyngeal swab specimens and should not be used as a sole basis for treatment.  Nasal washings and aspirates are unacceptable for Xpert Xpress SARS-CoV-2/FLU/RSV testing.  Fact Sheet for Patients: EntrepreneurPulse.com.au  Fact Sheet for Healthcare Providers: IncredibleEmployment.be  This test is not yet approved or cleared by the [place] FDA and has been authorized for detection and/or diagnosis of SARS-CoV-2 by FDA under an Emergency Use Authorization (EUA). This EUA will remain in effect (meaning this test can be used) for the duration of the COVID-19 declaration under Section 564(b)(1) of the Act, 21 U.S.C. section 360bbb-3(b)(1), unless the authorization is terminated or revoked.  Performed at [facility], [address], [place], [place] [address]     Radiology Reports DG Chest 1 View  Result Date: 10/25/2020 CLINICAL DATA:  Shortness of breath and leg swelling EXAM: CHEST  1 VIEW COMPARISON:  10/15/2020 FINDINGS: Post sternotomy changes. Similar elevation of right diaphragm. Mild cardiomegaly with aortic atherosclerosis. No pneumothorax. IMPRESSION: Cardiomegaly. Electronically Signed   By: Donavan Foil M.D.   On: 10/25/2020 18:35   MR FOOT RIGHT WO CONTRAST  Result Date: 11/22/2020 CLINICAL DATA:  [place]imp[place]. Acute foot pain. Infection suspected. Assess for cellulitis. Nonhealing diabetic wound to the heel. EXAM: MRI OF THE RIGHT FOREFOOT WITHOUT CONTRAST TECHNIQUE: Multiplanar, multisequence MR imaging of the right was performed. No intravenous contrast was administered. COMPARISON:  None. FINDINGS: Bones/Joint/Cartilage There is no bone marrow signal abnormality. Specifically, no evidence of osteomyelitis or edema. No evidence of a fracture. No bone lesions. Ankle joint is normally spaced and aligned. No cartilage defect. No joint effusion. Subtalar and midfoot joints are also normally spaced and aligned with no visualized cartilage defect or effusion. Ligaments Medial and lateral ligament complexes are intact. Ankle  synchondrosis is intact. Plantar fascia is normal in thickness and signal. Muscles and Tendons Achilles tendon, posterior tibialis tendon and peroneal tendons as well as the remaining ankle tendons are normal in signal and thickness. There is diffuse increased signal within the intrinsic foot musculature consistent with edema and/or cellulitis. Soft tissues Diffuse soft tissue high T2 signal extending from distal leg,  crossing the ankle and into the foot, predominating in the foot over the dorsal aspect. This is consistent with edema, cellulitis or a combination. No focal soft tissue fluid collection to suggest an abscess. No convincing soft tissue air. IMPRESSION: 1. Abnormal increased T2 signal of the soft tissues of the distal leg, ankle and visualized foot, in the foot predominantly over the dorsum. This is consistent with edema or cellulitis. Without the benefit contrast, the presence and degree of cellulitis cannot be accurately assessed. 2. No evidence of an abscess.  No evidence of soft tissue air. 3. No bone marrow signal abnormality to suggest osteomyelitis. 4. No joint abnormality.  Intact ligaments and tendons. Electronically Signed   By: Lajean Manes M.D.   On: 11/22/2020 18:54   US Venous Img Lower Bilateral (DVT)  Result Date: 11/23/2020 CLINICAL DATA:  Bilateral lower extremity swelling and cellulitis for 2 months EXAM: BILATERAL LOWER EXTREMITY VENOUS DOPPLER ULTRASOUND TECHNIQUE: Gray-scale sonography with compression, as well as color and duplex ultrasound, were performed to evaluate the deep venous system(s) from the level of the common femoral vein through the popliteal and proximal calf veins. COMPARISON:  None. FINDINGS: VENOUS Normal compressibility of the common femoral, superficial femoral, and popliteal veins, as well as the visualized calf veins. Visualized portions of profunda femoral vein and great saphenous vein unremarkable. No filling defects to suggest DVT on grayscale or color  Doppler imaging. Doppler waveforms show normal direction of venous flow, normal respiratory plasticity and response to augmentation. OTHER None. Limitations: Limited visualization of the calf veins due to edema. IMPRESSION: No lower extremity DVT Electronically Signed   By: Miachel Roux M.D.   On: 11/23/2020 10:33   DG Chest Portable 1 View  Result Date: 11/22/2020 CLINICAL DATA:  60 year old male with edema. EXAM: PORTABLE CHEST 1 VIEW COMPARISON:  Chest radiograph dated 10/25/2020. FINDINGS: There is mild eventration of the right hemidiaphragm. Left lateral pleural thickening and left mid to lower lung field atelectasis/scarring similar to prior radiograph. No new consolidation. No pleural effusion pneumothorax. Stable cardiomegaly. Median sternotomy wires and CABG vascular clips. No acute osseous pathology. IMPRESSION: 1. No acute cardiopulmonary process. 2. Stable cardiomegaly. Electronically Signed   By: Anner Crete M.D.   On: 11/22/2020 16:34   DG Foot Complete Right  Result Date: 11/22/2020 CLINICAL DATA:  Cellulitis and swelling both legs/right foot wound at heel/diabetic EXAM: RIGHT FOOT COMPLETE - 3+ VIEW COMPARISON:  None. FINDINGS: No fracture or bone lesion. No bone resorption to suggest osteomyelitis. Joints are normally spaced and aligned. There are dorsal and plantar calcaneal spurs. Diffuse soft tissue swelling most evident over the dorsal forefoot. There are arterial vascular calcifications. No soft tissue air. IMPRESSION: 1. No fracture or dislocation. 2. No evidence of osteomyelitis. 3. Soft tissue swelling without evidence of soft tissue air. Electronically Signed   By: Lajean Manes M.D.   On: 11/22/2020 14:14   DG Foot Complete Right  Result Date: 11/05/2020 CLINICAL DATA:  Pain, blister. EXAM: RIGHT FOOT COMPLETE - 3+ VIEW COMPARISON:  None. FINDINGS: There is no evidence of fracture or dislocation. Slight hammertoe deformity of the digits. No erosion or bony destruction.  Degenerative change in the mid and hindfoot most prominent at the talonavicular joint. Plantar calcaneal spur and Achilles tendon enthesophyte. Generalized soft tissue edema, prominent over the dorsum of the foot. There is also a rounded soft tissue density projecting over the plantar aspect of the calcaneus, indeterminate. No soft tissue air. No radiopaque foreign body. There  are vascular calcifications. IMPRESSION: 1. Generalized soft tissue edema. No acute osseous abnormality. 2. Rounded soft tissue density projecting over the plantar aspect of the calcaneus, indeterminate, may represent site of reported blister. Recommend correlation with physical exam. 3. Degenerative change in the mid and hindfoot. Plantar calcaneal spur and Achilles tendon enthesophyte. Electronically Signed   By: Keith Rake M.D.   On: 11/05/2020 21:10     CBC Recent Labs  Lab 11/22/20 1341 11/23/20 0507  WBC 12.2* 12.2*  HGB 11.2* 11.0*  HCT 35.4* 34.9*  PLT 324 325  MCV 86.1 85.7  MCH 27.3 27.0  MCHC 31.6 31.5  RDW 13.4 13.4  LYMPHSABS 1.9  --   MONOABS 1.1*  --   EOSABS 0.7*  --   BASOSABS 0.1  --     Chemistries  Recent Labs  Lab 11/22/20 1341 11/23/20 0507  NA 136 136  K 3.9 3.8  CL 97* 97*  CO2 29 25  GLUCOSE 204* 221*  BUN 34* 30*  CREATININE 1.04 0.90  CALCIUM 8.8* 8.9   ------------------------------------------------------------------------------------------------------------------ No results for input(s): CHOL, HDL, LDLCALC, TRIG, CHOLHDL, LDLDIRECT in the last 72 hours.  Lab Results  Component Value Date   HGBA1C 11.1 (H) 11/23/2020   ------------------------------------------------------------------------------------------------------------------ No results for input(s): TSH, T4TOTAL, T3FREE, THYROIDAB in the last 72 hours.  Invalid input(s): FREET3 ------------------------------------------------------------------------------------------------------------------ No results for  input(s): VITAMINB12, FOLATE, FERRITIN, TIBC, IRON, RETICCTPCT in the last 72 hours.  Coagulation profile No results for input(s): INR, PROTIME in the last 168 hours.  No results for input(s): DDIMER in the last 72 hours.  Cardiac Enzymes No results for input(s): CKMB, TROPONINI, MYOGLOBIN in the last 168 hours.  Invalid input(s): CK ------------------------------------------------------------------------------------------------------------------    Component Value Date/Time   BNP 65.0 11/22/2020 1341     Bashir Marchetti M.D on 11/24/2020 at 4:50 PM  Go to [email or web address] - for contact info  [facility] - Office  [phone]

## 2020-11-24 NOTE — Progress Notes (Signed)
Pharmacy Antibiotic Note  Shane Chambers is a 60 y.o. male admitted on 11/22/2020 with cellulitis who failed outpatient therapy.  Pharmacy has been consulted for vancomycin dosing.  Plan: Vancomycin peak and trough 1/16   Vancomycin loading dose 2500 mg (1 gram given @ 1530, ordered 1500 mg for now to complete loading dose). Followed by Vancomycin 1000 mg IV Q 12 hrs. Goal AUC 400-550. Expected AUC: 451.8, SCr used: 1.04, now 0.9 Monitor clinical progress, cultures/sensitivities, renal function, abx plan Vancomycin levels as indicated   Height: 5\' 9"  (175.3 cm) Weight: (!) 137.8 kg (303 lb 12.7 oz) IBW/kg (Calculated) : 70.7  Temp (24hrs), Avg:98.5 F (36.9 C), Min:98.2 F (36.8 C), Max:98.7 F (37.1 C)  Recent Labs  Lab 11/22/20 1341 11/22/20 2216 11/23/20 0507  WBC 12.2*  --  12.2*  CREATININE 1.04  --  0.90  LATICACIDVEN  --  1.7  --     Estimated Creatinine Clearance: 124.9 mL/min (by C-G formula based on SCr of 0.9 mg/dL).    Allergies  Allergen Reactions  . Ace Inhibitors Swelling and Cough  . Other Itching    [name]  . Penicillins     Has patient had a PCN reaction causing immediate rash, facial/tongue/throat swelling, SOB or lightheadedness with hypotension: Unknown Has patient had a PCN reaction causing severe rash involving mucus membranes or skin necrosis: Unknown Has patient had a PCN reaction that required hospitalization: Unknown Has patient had a PCN reaction occurring within the last 10 years: Unknown If all of the above answers are "NO", then may proceed with Cephalosporin use.     Antimicrobials this admission: 1/13 vancomycin >>  1/13 Rocephin >>   Dose adjustments this admission:  Microbiology results: 1/13 Resp COV/FLU panel: negative     Thank you for allowing [place] to participate in this patients care. Thomasenia Sales, PharmD, MBA, BCGP Clinical Pharmacist  11/24/2020 2:13 PM

## 2020-11-25 LAB — BASIC METABOLIC PANEL
Anion gap: 11 (ref 5–15)
BUN: 29 mg/dL — ABNORMAL HIGH (ref 6–20)
CO2: 26 mmol/L (ref 22–32)
Calcium: 9 mg/dL (ref 8.9–10.3)
Chloride: 100 mmol/L (ref 98–111)
Creatinine, Ser: 0.96 mg/dL (ref 0.61–1.24)
GFR, Estimated: >60 mL/min (ref >60)
Glucose, Bld: 139 mg/dL — ABNORMAL HIGH (ref 70–99)
Potassium: 3.7 mmol/L (ref 3.5–5.1)
Sodium: 137 mmol/L (ref 135–145)

## 2020-11-25 LAB — CBC
HCT: 32.5 % — ABNORMAL LOW (ref 39.0–52.0)
Hemoglobin: 10.4 g/dL — ABNORMAL LOW (ref 13.0–17.0)
MCH: 27.6 pg (ref 26.0–34.0)
MCHC: 32 g/dL (ref 30.0–36.0)
MCV: 86.2 fL (ref 80.0–100.0)
Platelets: 317 10*3/uL (ref 150–400)
RBC: 3.77 MIL/uL — ABNORMAL LOW (ref 4.22–5.81)
RDW: 13.6 % (ref 11.5–15.5)
WBC: 13.9 10*3/uL — ABNORMAL HIGH (ref 4.0–10.5)
nRBC: 0 % (ref 0.0–0.2)

## 2020-11-25 LAB — GLUCOSE, CAPILLARY
Glucose-Capillary: 202 mg/dL — ABNORMAL HIGH (ref 70–99)
Glucose-Capillary: 166 mg/dL — ABNORMAL HIGH (ref 70–99)
Glucose-Capillary: 193 mg/dL — ABNORMAL HIGH (ref 70–99)

## 2020-11-25 LAB — VANCOMYCIN, PEAK: Vancomycin Pk: 28 ug/mL — ABNORMAL LOW (ref 30–40)

## 2020-11-25 LAB — VANCOMYCIN, TROUGH: Vancomycin Tr: 18 ug/mL (ref 15–20)

## 2020-11-25 MED ORDER — INSULIN ASPART PROT & ASPART (70-30 MIX) 100 UNIT/ML ~~LOC~~ SUSP: 50.0000 [IU] | Freq: Two times a day (BID) | SUBCUTANEOUS | Status: DC

## 2020-11-25 MED ORDER — BISACODYL 10 MG RE SUPP
10.0000 mg | Freq: Once | RECTAL | Status: AC
  Administered 2020-11-25: 10 mg via RECTAL
  Filled 2020-11-25: qty 1

## 2020-11-25 MED ORDER — LACTULOSE 10 GM/15ML PO SOLN
30.0000 g | Freq: Once | ORAL | Status: AC
  Administered 2020-11-25: 30 g via ORAL
  Filled 2020-11-25: qty 60

## 2020-11-25 MED ORDER — INSULIN ASPART PROT & ASPART (70-30 MIX) 100 UNIT/ML ~~LOC~~ SUSP
50.0000 [IU] | Freq: Two times a day (BID) | SUBCUTANEOUS | Status: DC
  Administered 2020-11-25 – 2020-11-29 (×10): 50 [IU] via SUBCUTANEOUS
  Filled 2020-11-25: qty 10

## 2020-11-25 NOTE — TOC Initial Note (Signed)
Transition of Care [facility]) - Initial/Assessment Note    Patient Details  Name: Shane Chambers MRN: [id number] Date of Birth: Apr 01, 1961  Transition of Care [facility] CM/SW Contact:    Natasha Bence, LCSW Phone Number: 11/25/2020, 10:17 AM  Clinical Narrative:                 Patient is a 60 year old male admitted for Sepsis. Per MD, patient now agreeable to SNF. CSW placed inquiry with the financial counselor to determine if patient has SNF medicaid. Observed patient's high readmission risk score and conducted risk assessment. PCP appt to be scheduled closer to d/c. CSW assessed patient. Patient lives in a mobile home and is not able to receive [facility] services due to unsafe conditions of mobile home. CSW completed FL2 and is awaiting PT eval for SNF referrals. TOC to follow.   Expected Discharge Plan: Skilled Nursing Facility Barriers to Discharge: Continued Medical Work up   Patient Goals and CMS Choice Patient states their goals for this hospitalization and ongoing recovery are:: Rehab with SNF CMS Medicare.gov Compare Post Acute Care list provided to:: Patient Choice offered to / list presented to : Patient  Expected Discharge Plan and Services Expected Discharge Plan: [place] In-house Referral: NA Discharge Planning Services: NA Post Acute Care Choice: NA Living arrangements for the past 2 months: Mobile Home                 DME Arranged: N/A DME Agency: NA       HH Arranged: NA [place] Agency: NA        Prior Living Arrangements/Services Living arrangements for the past 2 months: Mobile Home Lives with:: Self Patient language and need for interpreter reviewed:: Yes Do you feel safe going back to the place where you live?: Yes      Need for Family Participation in Patient Care: Yes (Comment) Care giver support system in place?: No (comment) Current home services: DME Criminal Activity/Legal Involvement Pertinent to Current Situation/Hospitalization: No -  Comment as needed  Activities of Daily Living Home Assistive Devices/Equipment: Wheelchair,Walker (specify type),CBG Meter ADL Screening (condition at time of admission) Patient's cognitive ability adequate to safely complete daily activities?: Yes Is the patient deaf or have difficulty hearing?: No Does the patient have difficulty seeing, even when wearing glasses/contacts?: No Does the patient have difficulty concentrating, remembering, or making decisions?: No Patient able to express need for assistance with ADLs?: Yes Does the patient have difficulty dressing or bathing?: No Independently performs ADLs?: Yes (appropriate for developmental age) Does the patient have difficulty walking or climbing stairs?: Yes Weakness of Legs: Both Weakness of Arms/Hands: None  Permission Sought/Granted   Permission granted to share information with : Yes, Verbal Permission Granted     Permission granted to share info w AGENCY: SNF agencies        Emotional Assessment Appearance:: Disheveled Attitude/Demeanor/Rapport: Engaged Affect (typically observed): Accepting Orientation: : Oriented to Self,Oriented to Situation,Oriented to Place,Oriented to  Time Alcohol / Substance Use: Not Applicable Psych Involvement: No (comment)  Admission diagnosis:  Bilateral lower leg cellulitis [L03.116, L03.115] Cellulitis of lower extremity, unspecified laterality [A35.573] Patient Active Problem List   Diagnosis Date Noted  . Sepsis ([place]) 11/23/2020  . Bilateral lower leg cellulitis 11/22/2020  . Systolic and diastolic CHF, chronic ([place]) 11/22/2020  . CHF exacerbation ([place]) 08/01/2020  . Visit for wound check 05/28/2020  . OSA (obstructive sleep apnea) 05/24/2020  . Acute exacerbation of chronic obstructive pulmonary  disease (COPD) ([place]) 04/29/2020  . Leukocytosis 04/29/2020  . 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 Ischemic cardiomyopathy   . Coronary artery disease   . Acute on chronic combined systolic and  diastolic CHF (congestive heart failure) ([place]) 03/05/2020  . Acute hyponatremia 03/05/2020  . Severe Vitamin D deficiency 12/02/2019  . Hypokalemia 12/01/2019  . Hyperlipidemia 12/01/2019  . BPH (benign prostatic hyperplasia) 12/01/2019  . Normocytic anemia 12/01/2019  . Pneumonia due to COVID-19 virus 12/01/2019  . Hypoxia   . SOB (shortness of breath) 11/16/2019  . Hypertension   . Type 2 diabetes mellitus ([place])   . Depression   . Acute bronchitis with bronchospasm 11/15/2019   PCP:  [name], MD Pharmacy:   [id number] - [place], [address] [place] [address] Phone: [phone] Fax: [phone]     Social Determinants of Health (SDOH) Interventions    Readmission Risk Interventions Readmission Risk Prevention Plan 11/25/2020 11/23/2020 08/02/2020  Transportation Screening Complete Complete Complete  PCP or Specialist Appt within 3-5 Days - - -  HRI or Advance Work Consult for [place] - - -  Medication Review Press photographer) Complete - Complete  HRI or Home Care Consult Complete - Complete  SW Recovery Care/Counseling Consult Complete Complete Complete  Palliative Care Screening Not Applicable Not Applicable Not Applicable  Skilled Nursing Facility Complete Patient Refused Not Applicable  Some recent data might be hidden

## 2020-11-25 NOTE — NC FL2 (Signed)
[place] LEVEL OF CARE SCREENING TOOL     IDENTIFICATION  Patient Name: Shane Chambers Birthdate: 07/19/1961 Sex: male Admission Date (Current Location): 11/22/2020  [facility] and [place] Number:  [employer] and Address:  [place] [address] [place]      Provider Number: [id number]  Attending Physician Name and Address:  Roxan Hockey, MD  Relative Name and Phone Number:  LOYS, BLASS (Mother)   [phone]    Current Level of Care: Hospital Recommended Level of Care: [place] Prior Approval Number:    Date Approved/Denied: 03/09/20 PASRR Number: [id number]  Discharge Plan: ICF    Current Diagnoses: Patient Active Problem List   Diagnosis Date Noted  . Sepsis ([place]) 11/23/2020  . Bilateral lower leg cellulitis 11/22/2020  . Systolic and diastolic CHF, chronic ([place]) 11/22/2020  . CHF exacerbation ([place]) 08/01/2020  . Visit for wound check 05/28/2020  . OSA (obstructive sleep apnea) 05/24/2020  . Acute exacerbation of chronic obstructive pulmonary disease (COPD) ([place]) 04/29/2020  . Leukocytosis 04/29/2020  . 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 Ischemic cardiomyopathy   . Coronary artery disease   . Acute on chronic combined systolic and diastolic CHF (congestive heart failure) ([place]) 03/05/2020  . Acute hyponatremia 03/05/2020  . Severe Vitamin D deficiency 12/02/2019  . Hypokalemia 12/01/2019  . Hyperlipidemia 12/01/2019  . BPH (benign prostatic hyperplasia) 12/01/2019  . Normocytic anemia 12/01/2019  . Pneumonia due to COVID-19 virus 12/01/2019  . Hypoxia   . SOB (shortness of breath) 11/16/2019  . Hypertension   . Type 2 diabetes mellitus ([place])   . Depression   . Acute bronchitis with bronchospasm 11/15/2019    Orientation RESPIRATION BLADDER Height & Weight     Self,Time,Situation,Place  Normal Continent Weight: (!) 302 lb 4 oz (137.1 kg) Height:  5\' 9"  (175.3 cm)  BEHAVIORAL  SYMPTOMS/MOOD NEUROLOGICAL BOWEL NUTRITION STATUS      Continent Diet (Diet heart healthy/carb modified Room service appropriate? Yes; Fluid consistency: Thin)  AMBULATORY STATUS COMMUNICATION OF NEEDS Skin   Extensive Assist Verbally Other (Comment) (Cellulitis Bilateral leg Deep tissue Pressure injury foot)                       Personal Care Assistance Level of Assistance  Bathing,Feeding,Dressing Bathing Assistance: Limited assistance Feeding assistance: Limited assistance Dressing Assistance: Limited assistance     Functional Limitations Info  Sight,Hearing,Speech Sight Info: Adequate Hearing Info: Adequate Speech Info: Adequate    SPECIAL CARE FACTORS FREQUENCY  PT (By licensed PT)     PT Frequency: 5x              Contractures Contractures Info: Not present    Additional Factors Info  Code Status,Allergies,Insulin Sliding Scale Code Status Info: Full Allergies Info: Ace Inhibitors, [name], Penicillins   Insulin Sliding Scale Info: Insulin aspart (novoLOG) injections 0 - 5 units   0-5 units subcutaneous, daily at bedtime first dose on 11/22/20 at 2200   Correction coverage: HS scale  CVG <70: implement hyperglycemia standing orders and referred to hypoglycemia standard orders sidebar report   CBG 70 - 120: 0 units   CBG 121 dash 150: 0 units   CBG 151 dash 250: 0 units   CBG 201 dash 250: 2 units   CBG 251 dash 300: 3 units   CBG 301 dash 250 : 4 units   CBG 351 dash 400: 5 units   CBG > 400:  called MD and obtained STAT lab verification       Current Medications (11/25/2020):  This is the current hospital active medication list Current Facility-Administered Medications  Medication Dose Route Frequency Provider Last Rate Last Admin  . acetaminophen (TYLENOL) tablet 650 mg  650 mg Oral Q6H PRN Emokpae, Ejiroghene E, MD   650 mg at 11/22/20 2104   Or  . acetaminophen (TYLENOL) suppository 650 mg  650 mg Rectal Q6H PRN Emokpae, Ejiroghene E, MD      . aspirin  EC tablet 81 mg  81 mg Oral Daily Emokpae, Ejiroghene E, MD   81 mg at 11/25/20 0852  . atorvastatin (LIPITOR) tablet 80 mg  80 mg Oral Daily Emokpae, Ejiroghene E, MD   80 mg at 11/25/20 0852  . cefTRIAXone (ROCEPHIN) 2 g in sodium chloride 0.9 % 100 mL IVPB  2 g Intravenous Q24H Emokpae, Ejiroghene E, MD 200 mL/hr at 11/24/20 1952 2 g at 11/24/20 1952  . enoxaparin (LOVENOX) injection 60 mg  60 mg Subcutaneous Q24H Emokpae, Ejiroghene E, MD   60 mg at 11/24/20 1947  . furosemide (LASIX) injection 40 mg  40 mg Intravenous BID Roxan Hockey, MD   40 mg at 11/24/20 1807  . gabapentin (NEURONTIN) capsule 900 mg  900 mg Oral BID Emokpae, Ejiroghene E, MD   900 mg at 11/25/20 0853  . insulin aspart (novoLOG) injection 0-15 Units  0-15 Units Subcutaneous TID WC Emokpae, Ejiroghene E, MD   2 Units at 11/25/20 0851  . insulin aspart (novoLOG) injection 0-5 Units  0-5 Units Subcutaneous QHS Emokpae, Ejiroghene E, MD   2 Units at 11/24/20 2121  . insulin aspart protamine- aspart (NOVOLOG MIX 70/30) injection 50 Units  50 Units Subcutaneous BID WC Roxan Hockey, MD   50 Units at 11/25/20 0856  . ipratropium-albuterol (DUONEB) 0.5-2.5 (3) MG/3ML nebulizer solution 3 mL  3 mL Nebulization Q4H PRN Emokpae, Ejiroghene E, MD   3 mL at 11/25/20 0717  . losartan (COZAAR) tablet 12.5 mg  12.5 mg Oral Daily Emokpae, Ejiroghene E, MD   12.5 mg at 11/25/20 0853  . metoprolol succinate (TOPROL-XL) 24 hr tablet 12.5 mg  12.5 mg Oral BID Emokpae, Ejiroghene E, MD   12.5 mg at 11/25/20 0853  . mometasone-formoterol (DULERA) 200-5 MCG/ACT inhaler 2 puff  2 puff Inhalation BID Emokpae, Ejiroghene E, MD   2 puff at 11/25/20 0718  . ondansetron (ZOFRAN) tablet 4 mg  4 mg Oral Q6H PRN Emokpae, Ejiroghene E, MD       Or  . ondansetron (ZOFRAN) injection 4 mg  4 mg Intravenous Q6H PRN Emokpae, Ejiroghene E, MD      . oxyCODONE (Oxy IR/ROXICODONE) immediate release tablet 5 mg  5 mg Oral Q12H PRN [place] Brick, Courage, MD   5 mg at  11/25/20 0038  . pantoprazole (PROTONIX) EC tablet 40 mg  40 mg Oral Daily Emokpae, Ejiroghene E, MD   40 mg at 11/25/20 0853  . polyethylene glycol (MIRALAX / GLYCOLAX) packet 17 g  17 g Oral BID [place] Brick, Courage, MD   17 g at 11/25/20 0853  . tamsulosin (FLOMAX) capsule 0.4 mg  0.4 mg Oral Daily Emokpae, Ejiroghene E, MD   0.4 mg at 11/25/20 0852  . vancomycin (VANCOCIN) IVPB 1000 mg/200 mL premix  1,000 mg Intravenous Q12H Emokpae, Ejiroghene E, MD 200 mL/hr at 11/24/20 2124 1,000 mg at 11/24/20 2124  . venlafaxine XR (EFFEXOR-XR) 24 hr capsule 150 mg  150 mg Oral Q  breakfast Emokpae, Ejiroghene E, MD   150 mg at 11/25/20 8675     Discharge Medications: Please see discharge summary for a list of discharge medications.  Relevant Imaging Results:  Relevant Lab Results:   Additional Information Pt SSN: [id number]  Natasha Bence, LCSW

## 2020-11-25 NOTE — TOC Initial Note (Deleted)
Transition of Care [facility]) - Initial/Assessment Note    Patient Details  Name: Shane Chambers MRN: [id number] Date of Birth: 1961-03-31  [facility] CM/SW Contact:    Natasha Bence, LCSW Phone Number: 11/25/2020, 10:11 AM  Clinical Narrative:                 Patient is a 60 year old male admitted for Sepsis. Per MD, patient now agreeable to SNF. CSW placed inquiry with the financial counselor to determine if patient has SNF medicaid. Observed patient's high readmission risk score and conducted risk assessment. PCP appt to be scheduled closer to d/c. CSW assessed patient. Patient lives in a mobile home and is not able to receive [facility] services due to unsafe conditions of mobile home. CSW completed FL2 and is awaiting PT eval for SNF referrals. TOC to follow.   Expected Discharge Plan: Skilled Nursing Facility Barriers to Discharge: Continued Medical Work up   Patient Goals and CMS Choice Patient states their goals for this hospitalization and ongoing recovery are:: Rehab with SNF CMS Medicare.gov Compare Post Acute Care list provided to:: Patient Choice offered to / list presented to : Patient  Expected Discharge Plan and Services Expected Discharge Plan: [place] In-house Referral: NA Discharge Planning Services: NA Post Acute Care Choice: NA Living arrangements for the past 2 months: Mobile Home                 DME Arranged: N/A DME Agency: NA       HH Arranged: NA [place] Agency: NA        Prior Living Arrangements/Services Living arrangements for the past 2 months: Mobile Home Lives with:: Self Patient language and need for interpreter reviewed:: Yes Do you feel safe going back to the place where you live?: Yes      Need for Family Participation in Patient Care: Yes (Comment) Care giver support system in place?: No (comment) Current home services: DME Criminal Activity/Legal Involvement Pertinent to Current Situation/Hospitalization: No -  Comment as needed  Activities of Daily Living Home Assistive Devices/Equipment: Wheelchair,Walker (specify type),CBG Meter ADL Screening (condition at time of admission) Patient's cognitive ability adequate to safely complete daily activities?: Yes Is the patient deaf or have difficulty hearing?: No Does the patient have difficulty seeing, even when wearing glasses/contacts?: No Does the patient have difficulty concentrating, remembering, or making decisions?: No Patient able to express need for assistance with ADLs?: Yes Does the patient have difficulty dressing or bathing?: No Independently performs ADLs?: Yes (appropriate for developmental age) Does the patient have difficulty walking or climbing stairs?: Yes Weakness of Legs: Both Weakness of Arms/Hands: None  Permission Sought/Granted   Permission granted to share information with : Yes, Verbal Permission Granted     Permission granted to share info w AGENCY: SNF agencies        Emotional Assessment Appearance:: Disheveled Attitude/Demeanor/Rapport: Engaged Affect (typically observed): Accepting Orientation: : Oriented to Self,Oriented to Situation,Oriented to Place,Oriented to  Time Alcohol / Substance Use: Not Applicable Psych Involvement: No (comment)  Admission diagnosis:  Bilateral lower leg cellulitis [L03.116, L03.115] Cellulitis of lower extremity, unspecified laterality [J94.174] Patient Active Problem List   Diagnosis Date Noted  . Sepsis ([place]) 11/23/2020  . Bilateral lower leg cellulitis 11/22/2020  . Systolic and diastolic CHF, chronic ([place]) 11/22/2020  . CHF exacerbation ([place]) 08/01/2020  . Visit for wound check 05/28/2020  . OSA (obstructive sleep apnea) 05/24/2020  . Acute exacerbation of chronic obstructive pulmonary  disease (COPD) ([place]) 04/29/2020  . Leukocytosis 04/29/2020  . 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 Ischemic cardiomyopathy   . Coronary artery disease   . Acute on chronic combined systolic and  diastolic CHF (congestive heart failure) ([place]) 03/05/2020  . Acute hyponatremia 03/05/2020  . Severe Vitamin D deficiency 12/02/2019  . Hypokalemia 12/01/2019  . Hyperlipidemia 12/01/2019  . BPH (benign prostatic hyperplasia) 12/01/2019  . Normocytic anemia 12/01/2019  . Pneumonia due to COVID-19 virus 12/01/2019  . Hypoxia   . SOB (shortness of breath) 11/16/2019  . Hypertension   . Type 2 diabetes mellitus ([place])   . Depression   . Acute bronchitis with bronchospasm 11/15/2019   PCP:  [name], MD Pharmacy:   [id number] - [place], [address] [place] [address] Phone: [phone] Fax: [phone]     Social Determinants of Health (SDOH) Interventions    Readmission Risk Interventions Readmission Risk Prevention Plan 11/23/2020 08/02/2020 03/07/2020  Transportation Screening Complete Complete Complete  PCP or Specialist Appt within 3-5 Days - - -  HRI or [place] Work Consult for [place] - - -  Medication Review Press photographer) - Complete Complete  HRI or [place] - Complete -  SW Recovery Care/Counseling Consult Complete Complete -  Palliative Care Screening Not Applicable Not Applicable -  [place] Patient Refused Not Applicable -  Some recent data might be hidden

## 2020-11-25 NOTE — Evaluation (Signed)
Physical Therapy Evaluation Patient Details Name: Shane Chambers MRN: [id number] DOB: 06/07/1961 Today's Date: 11/25/2020   History of Present Illness  Shane Chambers is a 60 y.o. male with medical history significant for systolic and diastolic CHF, CABG x 5, COPD, diabetes mellitus, hypertension, COVID-19 infection.  Patient presented to the ED with complaints of leg swelling and pain with draining wounds ongoing for several weeks.  Patient reports wound started small swellings that opened up draining pus. Patient was in the ED 3 times this past December - 12/6, 12/16 and 11/05/20 patient has been treated with courses of antibiotics including doxycycline and Bactrim.  Patient does not check his feet frequently, and reports when he checks his weight is unreliable.  On his last weight check a few days ago was 289, weight here in the ED is 281.  He reports his legs are currently the most swollen they have ever been.  He denies increasing abdominal gait.  No difficulty breathing.  No cough.    Clinical Impression  Patient demonstrates slow labored movement for sitting up at bedside requiring HOB raised and use of bed rails, very unsteady on feet, at high risk for falls and limited to a few side steps at bedside due to generalized weakness and c/o fatigue.  Patient tolerated sitting up in chair after therapy.  Patient will benefit from continued physical therapy in hospital and recommended venue below to increase strength, balance, endurance for safe ADLs and gait.      Follow Up Recommendations SNF    Equipment Recommendations  None recommended by PT    Recommendations for Other Services       Precautions / Restrictions Precautions Precautions: Fall Restrictions Weight Bearing Restrictions: No      Mobility  Bed Mobility Overal bed mobility: Needs Assistance Bed Mobility: Supine to Sit;Sit to Supine     Supine to sit: Min guard;HOB elevated Sit to supine: Supervision   General  bed mobility comments: slow labored movement    Transfers Overall transfer level: Needs assistance Equipment used: Rolling walker (2 wheeled) Transfers: Sit to/from [facility] Sit to Stand: Min assist Stand pivot transfers: Min assist;Mod assist       General transfer comment: very unsteady on feet  Ambulation/Gait Ambulation/Gait assistance: Mod assist Gait Distance (Feet): 4 Feet Assistive device: Rolling walker (2 wheeled) Gait Pattern/deviations: Decreased step length - right;Decreased step length - left;Decreased stride length Gait velocity: decreased   General Gait Details: limited to 4-5 slow labored unsteady steps at bedside due to c/o generalized weakness, fatigue and poor standing balance  Stairs            Wheelchair Mobility    Modified Rankin (Stroke Patients Only)       Balance Overall balance assessment: Needs assistance Sitting-balance support: Feet supported;No upper extremity supported Sitting balance-Leahy Scale: Good Sitting balance - Comments: seated at EOB   Standing balance support: During functional activity;Bilateral upper extremity supported Standing balance-Leahy Scale: Fair Standing balance comment: using RW                             Pertinent Vitals/Pain Pain Assessment: 0-10 Pain Score: 7  Pain Location: BLE Pain Descriptors / Indicators: Sore;Discomfort Pain Intervention(s): Limited activity within patient's tolerance;Monitored during session;Repositioned    Home Living Family/patient expects to be discharged to:: Private residence Living Arrangements: Parent;Other relatives Available Help at Discharge: Family;Available PRN/intermittently Type of Home: Mobile home Home Access:  Stairs to enter Entrance Stairs-Rails: Right Entrance Stairs-Number of Steps: 5-6 Home Layout: One level Home Equipment: Walker - 2 wheels;Cane - quad;Bedside commode;Wheelchair - manual Additional Comments: bird  bathes with microwaved water per pt as water doesnt work at home    Prior Function Level of Independence: Needs assistance   Gait / Transfers Assistance Needed: household ambulator using RW, uses electric scooters where available when shopping; states he is supposed to be in w/c for mobility but unable to use in his home  ADL's / Homemaking Assistance Needed: family assists        Hand Dominance   Dominant Hand: Right    Extremity/Trunk Assessment   Upper Extremity Assessment Upper Extremity Assessment: Generalized weakness    Lower Extremity Assessment Lower Extremity Assessment: Generalized weakness    Cervical / Trunk Assessment Cervical / Trunk Assessment: Normal  Communication   Communication: No difficulties  Cognition Arousal/Alertness: Awake/alert Behavior During Therapy: WFL for tasks assessed/performed Overall Cognitive Status: Within Functional Limits for tasks assessed                                        General Comments      Exercises     Assessment/Plan    PT Assessment Patient needs continued PT services  PT Problem List Decreased strength;Decreased activity tolerance;Decreased balance;Decreased mobility       PT Treatment Interventions DME instruction;Gait training;Stair training;Functional mobility training;Therapeutic activities;Therapeutic exercise;Patient/family education;Balance training    PT Goals (Current goals can be found in the Care Plan section)  Acute Rehab PT Goals Patient Stated Goal: return home able to take care of self PT Goal Formulation: With patient Time For Goal Achievement: 12/09/20 Potential to Achieve Goals: Good    Frequency Min 3X/week   Barriers to discharge        Co-evaluation               AM-PAC PT "6 Clicks" Mobility  Outcome Measure Help needed turning from your back to your side while in a flat bed without using bedrails?: None Help needed moving from lying on your back to  sitting on the side of a flat bed without using bedrails?: A Little Help needed moving to and from a bed to a chair (including a wheelchair)?: A Lot Help needed standing up from a chair using your arms (e.g., wheelchair or bedside chair)?: A Lot Help needed to walk in hospital room?: A Lot Help needed climbing 3-5 steps with a railing? : Total 6 Click Score: 14    End of Session   Activity Tolerance: Patient tolerated treatment well;Patient limited by fatigue Patient left: in chair;with call bell/phone within reach Nurse Communication: Mobility status PT Visit Diagnosis: Unsteadiness on feet (R26.81);Other abnormalities of gait and mobility (R26.89);Muscle weakness (generalized) (M62.81)    Time: 5035-4656 PT Time Calculation (min) (ACUTE ONLY): 20 min   Charges:   PT Evaluation $PT Eval Moderate Complexity: 1 Mod PT Treatments $Therapeutic Activity: 8-22 mins        10:21 AM, 11/25/20 Lonell Grandchild, MPT Physical Therapist with [facility] [phone] office [phone] mobile phone

## 2020-11-25 NOTE — Progress Notes (Signed)
Patient Demographics:    Shane Chambers, is a 60 y.o. male, DOB - 1960-12-07, UEK:[id number]  Admit date - 11/22/2020   Admitting Physician Ejiroghene Arlyce Dice, MD  Outpatient Primary MD for the patient is [name], MD  LOS - 3   Chief Complaint  Patient presents with  . Leg Pain        Subjective:    [name] today has no fevers, no emesis,  No chest pain,   No fever  Or chills  -Continues to have difficulty with BM -Complains of generalized weakness and deconditioning  Assessment  & Plan :    Principal Problem:   Sepsis ([place]) Active Problems:   Hypertension   Type 2 diabetes mellitus (HCC)   Ischemic cardiomyopathy   S/P CABG x 5   Bilateral lower leg cellulitis   Systolic and diastolic CHF, chronic (HCC)  Brief Summary:- 60 y.o. male with medical history significant for systolic and diastolic CHF, CABG x 5, COPD, diabetes mellitus, hypertension, prior COVID-19 infection (11/2019) admitted on 11/23/2019 with bilateral lower extremity cellulitis  A/p  1) sepsis secondary to bilateral lower extremity cellulitis without osteomyelitis---  please see photos in epic -Patient met sepsis criteria on admission with leukocytosis, tachycardia and tachypnea in the setting of cellulitis  -Previously failed oral antibiotics as outpatient  -WBCs 13.9 (patient appears to have some degree of chronic leukocytosis) - continue Rocephin and vancomycin (patient had MRSA positive PCR screening back in May 2021 and again in September 2021)  2)DM2-A1c is 11.1 reflecting uncontrolled DM with hyperglycemia PTA -Continue 70/30 insulin at 55 units twice daily along with sliding scale coverage  3)HTN--stable, continue metoprolol 12.5 mg twice daily and losartan 12.5 mg daily  4)BPH--stable, continue Flomax  5)COPD--- no acute exacerbation at this time, continue Dulera  6) CAD-- s/p Prior CABG--stable,  continue aspirin and Lipitor, as well as metoprolol  7)HFpEF--- patient with chronic diastolic dysfunction CHF, Echo with EF of 55 to 60%, and grade 2 diastolic dysfunction -Lower extremity edema noted, some dyspnea on exertion noted, IV fluids as ordered  8) generalized weakness and ambulatory dysfunction--- PT eval appreciated, -recommends SNF rehab  9) constipation--lactulose and MiraLAX as ordered  Disposition/Need for in-Hospital Stay- patient unable to be discharged at this time due to sepsis secondary to cellulitis, failure of oral antibiotics as outpatient requires IV antibiotics  Status is: Inpatient  Remains inpatient appropriate because:Please see above   Disposition: The patient is from: Home              Anticipated d/c is to: SNF                 Anticipated d/c date is: 1 day              Patient currently is not medically stable to d/c. Barriers: Not Clinically Stable-   Code Status :  -  Code Status: Full Code   Family Communication:    NA (patient is alert, awake and coherent)  Consults  :  na  DVT Prophylaxis  :   - SCDs      Lab Results  Component Value Date   PLT 317 11/25/2020    Inpatient Medications  Scheduled Meds: . aspirin EC  81 mg Oral Daily  . atorvastatin  80 mg Oral Daily  . enoxaparin (LOVENOX) injection  60 mg Subcutaneous Q24H  . furosemide  40 mg Intravenous BID  . gabapentin  900 mg Oral BID  . insulin aspart  0-15 Units Subcutaneous TID WC  . insulin aspart  0-5 Units Subcutaneous QHS  . insulin aspart protamine- aspart  50 Units Subcutaneous BID WC  . lactulose  30 g Oral Once  . losartan  12.5 mg Oral Daily  . metoprolol succinate  12.5 mg Oral BID  . mometasone-formoterol  2 puff Inhalation BID  . pantoprazole  40 mg Oral Daily  . polyethylene glycol  17 g Oral BID  . tamsulosin  0.4 mg Oral Daily  . venlafaxine XR  150 mg Oral Q breakfast   Continuous Infusions: . cefTRIAXone (ROCEPHIN)  IV 2 g (11/24/20 1952)  .  vancomycin 1,000 mg (11/25/20 1016)   PRN Meds:.acetaminophen **OR** acetaminophen, ipratropium-albuterol, ondansetron **OR** ondansetron (ZOFRAN) IV, oxyCODONE    Anti-infectives (From admission, onward)   Start     Dose/Rate Route Frequency Ordered Stop   11/23/20 1000  vancomycin (VANCOCIN) IVPB 1000 mg/200 mL premix        1,000 mg 200 mL/hr over 60 Minutes Intravenous Every 12 hours 11/22/20 2121     11/22/20 2115  vancomycin (VANCOREADY) IVPB 1500 mg/300 mL        1,500 mg 150 mL/hr over 120 Minutes Intravenous  Once 11/22/20 2112 11/23/20 0112   11/22/20 2045  cefTRIAXone (ROCEPHIN) 2 g in sodium chloride 0.9 % 100 mL IVPB        2 g 200 mL/hr over 30 Minutes Intravenous Every 24 hours 11/22/20 2037     11/22/20 1515  vancomycin (VANCOCIN) IVPB 1000 mg/200 mL premix        1,000 mg 200 mL/hr over 60 Minutes Intravenous  Once 11/22/20 1513 11/22/20 1630        Objective:   Vitals:   11/24/20 2040 11/24/20 2049 11/25/20 0718 11/25/20 1447  BP:  112/63  126/65  Pulse:  86  91  Resp: 18 18    Temp:  97.9 F (36.6 C)  98.5 F (36.9 C)  TempSrc:  Oral  Oral  SpO2: 96% 98% 97% 95%  Weight:  (!) 137.1 kg    Height:        Wt Readings from Last 3 Encounters:  11/24/20 (!) 137.1 kg  11/05/20 129.7 kg  10/15/20 129.7 kg     Intake/Output Summary (Last 24 hours) at 11/25/2020 1832 Last data filed at 11/25/2020 0900 Gross per 24 hour  Intake 480 ml  Output 800 ml  Net -320 ml    Physical Exam  Gen:- Awake Alert,  Obese, no apparent distress  HEENT:- [place].AT, No sclera icterus Neck-Supple Neck,No JVD,.  Lungs-diminished in bases, no wheezing  CV- S1, S2 normal, regular  Abd-  +ve B.Sounds, Abd Soft, No tenderness,    Extremity/Skin:-  pedal pulses present , edema and cellulitis please see photos in epic Psych-affect is appropriate, oriented x3 Neuro-generalized weakness, no new focal deficits, no tremors   Data Review:   Micro Results Recent Results (from the  past 240 hour(s))  Resp Panel by RT-PCR (Flu A&B, Covid) Nasopharyngeal Swab     Status: None   Collection Time: 11/22/20  3:15 PM   Specimen: Nasopharyngeal Swab; Nasopharyngeal(NP) swabs in vial transport medium  Result Value Ref Range Status   SARS Coronavirus 2 by RT PCR  NEGATIVE NEGATIVE Final    Comment: (NOTE) SARS-CoV-2 target nucleic acids are NOT DETECTED.  The SARS-CoV-2 RNA is generally detectable in upper respiratory specimens during the acute phase of infection. The lowest concentration of SARS-CoV-2 viral copies this assay can detect is 138 copies/mL. A negative result does not preclude SARS-Cov-2 infection and should not be used as the sole basis for treatment or other patient management decisions. A negative result may occur with  improper specimen collection/handling, submission of specimen other than nasopharyngeal swab, presence of viral mutation(s) within the areas targeted by this assay, and inadequate number of viral copies(<138 copies/mL). A negative result must be combined with clinical observations, patient history, and epidemiological information. The expected result is Negative.  Fact Sheet for Patients:  EntrepreneurPulse.com.au  Fact Sheet for Healthcare Providers:  IncredibleEmployment.be  This test is no t yet approved or cleared by the [place] FDA and  has been authorized for detection and/or diagnosis of SARS-CoV-2 by FDA under an Emergency Use Authorization (EUA). This EUA will remain  in effect (meaning this test can be used) for the duration of the COVID-19 declaration under Section 564(b)(1) of the Act, 21 U.S.C.section 360bbb-3(b)(1), unless the authorization is terminated  or revoked sooner.       Influenza A by PCR NEGATIVE NEGATIVE Final   Influenza B by PCR NEGATIVE NEGATIVE Final    Comment: (NOTE) The Xpert Xpress SARS-CoV-2/FLU/RSV plus assay is intended as an aid in the diagnosis of  influenza from Nasopharyngeal swab specimens and should not be used as a sole basis for treatment. Nasal washings and aspirates are unacceptable for Xpert Xpress SARS-CoV-2/FLU/RSV testing.  Fact Sheet for Patients: EntrepreneurPulse.com.au  Fact Sheet for Healthcare Providers: IncredibleEmployment.be  This test is not yet approved or cleared by the [place] FDA and has been authorized for detection and/or diagnosis of SARS-CoV-2 by FDA under an Emergency Use Authorization (EUA). This EUA will remain in effect (meaning this test can be used) for the duration of the COVID-19 declaration under Section 564(b)(1) of the Act, 21 U.S.C. section 360bbb-3(b)(1), unless the authorization is terminated or revoked.  Performed at [facility], [address], [place], [place] [address]     Radiology Reports MR FOOT RIGHT WO CONTRAST  Result Date: 11/22/2020 CLINICAL DATA:  Limping. Acute foot pain. Infection suspected. Assess for cellulitis. Nonhealing diabetic wound to the heel. EXAM: MRI OF THE RIGHT FOREFOOT WITHOUT CONTRAST TECHNIQUE: Multiplanar, multisequence MR imaging of the right was performed. No intravenous contrast was administered. COMPARISON:  None. FINDINGS: Bones/Joint/Cartilage There is no bone marrow signal abnormality. Specifically, no evidence of osteomyelitis or edema. No evidence of a fracture. No bone lesions. Ankle joint is normally spaced and aligned. No cartilage defect. No joint effusion. Subtalar and midfoot joints are also normally spaced and aligned with no visualized cartilage defect or effusion. Ligaments Medial and lateral ligament complexes are intact. Ankle synchondrosis is intact. Plantar fascia is normal in thickness and signal. Muscles and Tendons Achilles tendon, posterior tibialis tendon and peroneal tendons as well as the remaining ankle tendons are normal in signal and thickness. There is diffuse increased signal within  the intrinsic foot musculature consistent with edema and/or cellulitis. Soft tissues Diffuse soft tissue high T2 signal extending from distal leg, crossing the ankle and into the foot, predominating in the foot over the dorsal aspect. This is consistent with edema, cellulitis or a combination. No focal soft tissue fluid collection to suggest an abscess. No convincing soft tissue air. IMPRESSION: 1.  Abnormal increased T2 signal of the soft tissues of the distal leg, ankle and visualized foot, in the foot predominantly over the dorsum. This is consistent with edema or cellulitis. Without the benefit contrast, the presence and degree of cellulitis cannot be accurately assessed. 2. No evidence of an abscess.  No evidence of soft tissue air. 3. No bone marrow signal abnormality to suggest osteomyelitis. 4. No joint abnormality.  Intact ligaments and tendons. Electronically Signed   By: Lajean Manes M.D.   On: 11/22/2020 18:54   US Venous Img Lower Bilateral (DVT)  Result Date: 11/23/2020 CLINICAL DATA:  Bilateral lower extremity swelling and cellulitis for 2 months EXAM: BILATERAL LOWER EXTREMITY VENOUS DOPPLER ULTRASOUND TECHNIQUE: Gray-scale sonography with compression, as well as color and duplex ultrasound, were performed to evaluate the deep venous system(s) from the level of the common femoral vein through the popliteal and proximal calf veins. COMPARISON:  None. FINDINGS: VENOUS Normal compressibility of the common femoral, superficial femoral, and popliteal veins, as well as the visualized calf veins. Visualized portions of profunda femoral vein and great saphenous vein unremarkable. No filling defects to suggest DVT on grayscale or color Doppler imaging. Doppler waveforms show normal direction of venous flow, normal respiratory plasticity and response to augmentation. OTHER None. Limitations: Limited visualization of the calf veins due to edema. IMPRESSION: No lower extremity DVT Electronically Signed   By:  Miachel Roux M.D.   On: 11/23/2020 10:33   DG Chest Portable 1 View  Result Date: 11/22/2020 CLINICAL DATA:  60 year old male with edema. EXAM: PORTABLE CHEST 1 VIEW COMPARISON:  Chest radiograph dated 10/25/2020. FINDINGS: There is mild eventration of the right hemidiaphragm. Left lateral pleural thickening and left mid to lower lung field atelectasis/scarring similar to prior radiograph. No new consolidation. No pleural effusion pneumothorax. Stable cardiomegaly. Median sternotomy wires and CABG vascular clips. No acute osseous pathology. IMPRESSION: 1. No acute cardiopulmonary process. 2. Stable cardiomegaly. Electronically Signed   By: Anner Crete M.D.   On: 11/22/2020 16:34   DG Foot Complete Right  Result Date: 11/22/2020 CLINICAL DATA:  Cellulitis and swelling both legs/right foot wound at heel/diabetic EXAM: RIGHT FOOT COMPLETE - 3+ VIEW COMPARISON:  None. FINDINGS: No fracture or bone lesion. No bone resorption to suggest osteomyelitis. Joints are normally spaced and aligned. There are dorsal and plantar calcaneal spurs. Diffuse soft tissue swelling most evident over the dorsal forefoot. There are arterial vascular calcifications. No soft tissue air. IMPRESSION: 1. No fracture or dislocation. 2. No evidence of osteomyelitis. 3. Soft tissue swelling without evidence of soft tissue air. Electronically Signed   By: Lajean Manes M.D.   On: 11/22/2020 14:14   DG Foot Complete Right  Result Date: 11/05/2020 CLINICAL DATA:  Pain, blister. EXAM: RIGHT FOOT COMPLETE - 3+ VIEW COMPARISON:  None. FINDINGS: There is no evidence of fracture or dislocation. Slight hammertoe deformity of the digits. No erosion or bony destruction. Degenerative change in the mid and hindfoot most prominent at the talonavicular joint. Plantar calcaneal spur and Achilles tendon enthesophyte. Generalized soft tissue edema, prominent over the dorsum of the foot. There is also a rounded soft tissue density projecting over the  plantar aspect of the calcaneus, indeterminate. No soft tissue air. No radiopaque foreign body. There are vascular calcifications. IMPRESSION: 1. Generalized soft tissue edema. No acute osseous abnormality. 2. Rounded soft tissue density projecting over the plantar aspect of the calcaneus, indeterminate, may represent site of reported blister. Recommend correlation with physical exam. 3. Degenerative change  in the mid and hindfoot. Plantar calcaneal spur and Achilles tendon enthesophyte. Electronically Signed   By: Keith Rake M.D.   On: 11/05/2020 21:10     CBC Recent Labs  Lab 11/22/20 1341 11/23/20 0507 11/25/20 0758  WBC 12.2* 12.2* 13.9*  HGB 11.2* 11.0* 10.4*  HCT 35.4* 34.9* 32.5*  PLT 324 325 317  MCV 86.1 85.7 86.2  MCH 27.3 27.0 27.6  MCHC 31.6 31.5 32.0  RDW 13.4 13.4 13.6  LYMPHSABS 1.9  --   --   MONOABS 1.1*  --   --   EOSABS 0.7*  --   --   BASOSABS 0.1  --   --     Chemistries  Recent Labs  Lab 11/22/20 1341 11/23/20 0507 11/25/20 0758  NA 136 136 137  K 3.9 3.8 3.7  CL 97* 97* 100  CO2 29 25 26   GLUCOSE 204* 221* 139*  BUN 34* 30* 29*  CREATININE 1.04 0.90 0.96  CALCIUM 8.8* 8.9 9.0   ------------------------------------------------------------------------------------------------------------------ No results for input(s): CHOL, HDL, LDLCALC, TRIG, CHOLHDL, LDLDIRECT in the last 72 hours.  Lab Results  Component Value Date   HGBA1C 11.1 (H) 11/23/2020   ------------------------------------------------------------------------------------------------------------------ No results for input(s): TSH, T4TOTAL, T3FREE, THYROIDAB in the last 72 hours.  Invalid input(s): FREET3 ------------------------------------------------------------------------------------------------------------------ No results for input(s): VITAMINB12, FOLATE, FERRITIN, TIBC, IRON, RETICCTPCT in the last 72 hours.  Coagulation profile No results for input(s): INR, PROTIME in  the last 168 hours.  No results for input(s): DDIMER in the last 72 hours.  Cardiac Enzymes No results for input(s): CKMB, TROPONINI, MYOGLOBIN in the last 168 hours.  Invalid input(s): CK ------------------------------------------------------------------------------------------------------------------    Component Value Date/Time   BNP 65.0 11/22/2020 1341   Muhamed Luecke M.D on 11/25/2020 at 6:32 PM  Go to [email or web address] - for contact info  [facility] - Office  [phone]

## 2020-11-25 NOTE — Plan of Care (Signed)
  Problem: Acute Rehab PT Goals(only PT should resolve) Goal: Pt Will Go Supine/Side To Sit Outcome: Progressing Flowsheets (Taken 11/25/2020 1022) Pt will go Supine/Side to Sit: with modified independence Goal: Patient Will Transfer Sit To/From Stand Outcome: Progressing Flowsheets (Taken 11/25/2020 1022) Patient will transfer sit to/from stand:  with supervision  with min guard assist Goal: Pt Will Transfer Bed To Chair/Chair To Bed Outcome: Progressing Flowsheets (Taken 11/25/2020 1022) Pt will Transfer Bed to Chair/Chair to Bed:  with supervision  min guard assist Goal: Pt Will Ambulate Outcome: Progressing Flowsheets (Taken 11/25/2020 1022) Pt will Ambulate:  25 feet  with supervision  with min guard assist  with rolling walker   10:22 AM, 11/25/20 Lonell Grandchild, MPT Physical Therapist with [facility] [phone] office [phone] mobile phone

## 2020-11-25 NOTE — Progress Notes (Signed)
Pharmacy Antibiotic Note  Shane Chambers is a 60 y.o. male admitted on 11/22/2020 with cellulitis who failed outpatient therapy.  Pharmacy has been consulted for vancomycin dosing.  Vancomycin peak and trough ordered, but peak not collected. Vancomycin trough of 18  Plan: Continue therapy with safe trough despite AUC dosing.  Vancomycin 1000 mg IV Q 12 hrs. Goal AUC 400-550. Expected AUC: 451.8, SCr used: 1.04, now 0.9 Monitor clinical progress, cultures/sensitivities, renal function, abx plan Vancomycin levels as indicated   Height: 5\' 9"  (175.3 cm) Weight: (!) 137.1 kg (302 lb 4 oz) IBW/kg (Calculated) : 70.7  Temp (24hrs), Avg:98.4 F (36.9 C), Min:97.9 F (36.6 C), Max:98.8 F (37.1 C)  Recent Labs  Lab 11/22/20 1341 11/22/20 2216 11/23/20 0507 11/25/20 0758  WBC 12.2*  --  12.2* 13.9*  CREATININE 1.04  --  0.90 0.96  LATICACIDVEN  --  1.7  --   --   VANCOTROUGH  --   --   --  18    Estimated Creatinine Clearance: 116.8 mL/min (by C-G formula based on SCr of 0.96 mg/dL).    Allergies  Allergen Reactions  . Ace Inhibitors Swelling and Cough  . Other Itching    [name]  . Penicillins     Has patient had a PCN reaction causing immediate rash, facial/tongue/throat swelling, SOB or lightheadedness with hypotension: Unknown Has patient had a PCN reaction causing severe rash involving mucus membranes or skin necrosis: Unknown Has patient had a PCN reaction that required hospitalization: Unknown Has patient had a PCN reaction occurring within the last 10 years: Unknown If all of the above answers are "NO", then may proceed with Cephalosporin use.     Antimicrobials this admission: 1/13 vancomycin >>  1/13 Rocephin >>   Dose adjustments this admission:  Microbiology results: 1/13 Resp COV/FLU panel: negative   Thank you for allowing [place] to participate in this patients care. Thomasenia Sales, PharmD, MBA, BCGP Clinical Pharmacist  11/25/2020 1:05 PM

## 2020-11-26 LAB — GLUCOSE, CAPILLARY: Glucose-Capillary: 133 mg/dL — ABNORMAL HIGH (ref 70–99)

## 2020-11-26 MED ORDER — DOXYCYCLINE HYCLATE 100 MG PO TABS
100.0000 mg | ORAL_TABLET | Freq: Two times a day (BID) | ORAL | Status: DC
  Administered 2020-11-27 – 2020-11-29 (×5): 100 mg via ORAL
  Filled 2020-11-26 (×5): qty 1

## 2020-11-26 MED ORDER — DOXYCYCLINE HYCLATE 100 MG PO TABS: 100.0000 mg | ORAL_TABLET | Freq: Two times a day (BID) | ORAL | Status: DC

## 2020-11-26 NOTE — Progress Notes (Signed)
Patient Demographics:    Shane Chambers, is a 60 y.o. male, DOB - 03-Mar-1961, XIP:[id number]  Admit date - 11/22/2020   Admitting Physician Ejiroghene Arlyce Dice, MD  Outpatient Primary MD for the patient is [name], MD  LOS - 4   Chief Complaint  Patient presents with  . Leg Pain        Subjective:    [name] today has no fevers, no emesis,  No chest pain,   No fever  Or chills  Had BMs  Assessment  & Plan :    Principal Problem:   Sepsis ([place]) Active Problems:   Hypertension   Type 2 diabetes mellitus (HCC)   Ischemic cardiomyopathy   S/P CABG x 5   Bilateral lower leg cellulitis   Systolic and diastolic CHF, chronic (HCC)  Brief Summary:- 60 y.o. male with medical history significant for systolic and diastolic CHF, CABG x 5, COPD, diabetes mellitus, hypertension, prior COVID-19 infection (11/2019) admitted on 11/23/2019 with bilateral lower extremity cellulitis  A/p  1) sepsis secondary to bilateral lower extremity cellulitis without osteomyelitis---  please see photos in epic -Patient met sepsis criteria on admission with leukocytosis, tachycardia and tachypnea in the setting of cellulitis  -Previously failed oral antibiotics as outpatient  -WBCs 13.9 (patient appears to have some degree of chronic leukocytosis) - continue Rocephin  Plan to stop IV vancomycin on 11/26/2020 and transition to p.o. doxycycline on 11/27/2020-- (patient had MRSA positive PCR screening back in May 2021 and again in September 2021)  2)DM2-A1c is 11.1 reflecting uncontrolled DM with hyperglycemia PTA -Continue 70/30 insulin at 55 units twice daily along with sliding scale coverage  3)HTN--stable, continue metoprolol 12.5 mg twice daily and losartan 12.5 mg daily  4)BPH--stable, continue Flomax  5)COPD--- no acute exacerbation at this time, continue Dulera  6) CAD-- s/p Prior CABG--stable, continue  aspirin and Lipitor, as well as metoprolol  7)HFpEF--- patient with chronic diastolic dysfunction CHF, Echo with EF of 55 to 60%, and grade 2 diastolic dysfunction -Lower extremity edema noted, some dyspnea on exertion noted, IV fluids as ordered  8)Generalized weakness and ambulatory dysfunction--- PT eval appreciated, -recommends SNF rehab  9) constipation-- improved after multiple doses of Lactulose and MiraLAX as ordered  Disposition/Need for in-Hospital Stay- patient unable to be discharged at this time due to sepsis secondary to cellulitis, failure of oral antibiotics as outpatient requires IV antibiotics  Status is: Inpatient  Remains inpatient appropriate because:Please see above   Disposition: The patient is from: Home              Anticipated d/c is to: SNF                 Anticipated d/c date is: 1 day              Patient currently is not medically stable to d/c. Barriers: Not Clinically Stable-   Code Status :  -  Code Status: Full Code   Family Communication:    NA (patient is alert, awake and coherent)  Consults  :  na  DVT Prophylaxis  :   - SCDs      Lab Results  Component Value Date   PLT 317 11/25/2020  Inpatient Medications  Scheduled Meds: . aspirin EC  81 mg Oral Daily  . atorvastatin  80 mg Oral Daily  . doxycycline  100 mg Oral Q12H  . enoxaparin (LOVENOX) injection  60 mg Subcutaneous Q24H  . furosemide  40 mg Intravenous BID  . gabapentin  900 mg Oral BID  . insulin aspart  0-15 Units Subcutaneous TID WC  . insulin aspart  0-5 Units Subcutaneous QHS  . insulin aspart protamine- aspart  50 Units Subcutaneous BID WC  . losartan  12.5 mg Oral Daily  . metoprolol succinate  12.5 mg Oral BID  . mometasone-formoterol  2 puff Inhalation BID  . pantoprazole  40 mg Oral Daily  . polyethylene glycol  17 g Oral BID  . tamsulosin  0.4 mg Oral Daily  . venlafaxine XR  150 mg Oral Q breakfast   Continuous Infusions: . cefTRIAXone (ROCEPHIN)  IV 2  g (11/25/20 2125)  . vancomycin 1,000 mg (11/26/20 1240)   PRN Meds:.acetaminophen **OR** acetaminophen, ipratropium-albuterol, ondansetron **OR** ondansetron (ZOFRAN) IV, oxyCODONE    Anti-infectives (From admission, onward)   Start     Dose/Rate Route Frequency Ordered Stop   11/26/20 2200  doxycycline (VIBRA-TABS) tablet 100 mg        100 mg Oral Every 12 hours 11/26/20 1911     11/23/20 1000  vancomycin (VANCOCIN) IVPB 1000 mg/200 mL premix        1,000 mg 200 mL/hr over 60 Minutes Intravenous Every 12 hours 11/22/20 2121     11/22/20 2115  vancomycin (VANCOREADY) IVPB 1500 mg/300 mL        1,500 mg 150 mL/hr over 120 Minutes Intravenous  Once 11/22/20 2112 11/23/20 0112   11/22/20 2045  cefTRIAXone (ROCEPHIN) 2 g in sodium chloride 0.9 % 100 mL IVPB        2 g 200 mL/hr over 30 Minutes Intravenous Every 24 hours 11/22/20 2037     11/22/20 1515  vancomycin (VANCOCIN) IVPB 1000 mg/200 mL premix        1,000 mg 200 mL/hr over 60 Minutes Intravenous  Once 11/22/20 1513 11/22/20 1630        Objective:   Vitals:   11/25/20 2005 11/26/20 0406 11/26/20 0713 11/26/20 0835  BP: 120/68 111/90  134/67  Pulse: 88 95  91  Resp: 18 18    Temp: 98.1 F (36.7 C) (!) 97.2 F (36.2 C)    TempSrc: Oral     SpO2: 100% 99% 100%   Weight:      Height:        Wt Readings from Last 3 Encounters:  11/24/20 (!) 137.1 kg  11/05/20 129.7 kg  10/15/20 129.7 kg     Intake/Output Summary (Last 24 hours) at 11/26/2020 1913 Last data filed at 11/26/2020 0238 Gross per 24 hour  Intake 240 ml  Output --  Net 240 ml    Physical Exam  Gen:- Awake Alert,  Obese, no apparent distress  HEENT:- [place].AT, No sclera icterus Neck-Supple Neck,No JVD,.  Lungs-diminished in bases, no wheezing  CV- S1, S2 normal, regular  Abd-  +ve B.Sounds, Abd Soft, No tenderness,    Extremity/Skin:-  pedal pulses present , edema and cellulitis please see photos in epic  Psych-affect is appropriate, oriented  x3 Neuro-generalized weakness, no new focal deficits, no tremors   Data Review:   Micro Results Recent Results (from the past 240 hour(s))  Resp Panel by RT-PCR (Flu A&B, Covid) Nasopharyngeal Swab  Status: None   Collection Time: 11/22/20  3:15 PM   Specimen: Nasopharyngeal Swab; Nasopharyngeal(NP) swabs in vial transport medium  Result Value Ref Range Status   SARS Coronavirus 2 by RT PCR NEGATIVE NEGATIVE Final    Comment: (NOTE) SARS-CoV-2 target nucleic acids are NOT DETECTED.  The SARS-CoV-2 RNA is generally detectable in upper respiratory specimens during the acute phase of infection. The lowest concentration of SARS-CoV-2 viral copies this assay can detect is 138 copies/mL. A negative result does not preclude SARS-Cov-2 infection and should not be used as the sole basis for treatment or other patient management decisions. A negative result may occur with  improper specimen collection/handling, submission of specimen other than nasopharyngeal swab, presence of viral mutation(s) within the areas targeted by this assay, and inadequate number of viral copies(<138 copies/mL). A negative result must be combined with clinical observations, patient history, and epidemiological information. The expected result is Negative.  Fact Sheet for Patients:  EntrepreneurPulse.com.au  Fact Sheet for Healthcare Providers:  IncredibleEmployment.be  This test is no t yet approved or cleared by the [place] FDA and  has been authorized for detection and/or diagnosis of SARS-CoV-2 by FDA under an Emergency Use Authorization (EUA). This EUA will remain  in effect (meaning this test can be used) for the duration of the COVID-19 declaration under Section 564(b)(1) of the Act, 21 U.S.C.section 360bbb-3(b)(1), unless the authorization is terminated  or revoked sooner.       Influenza A by PCR NEGATIVE NEGATIVE Final   Influenza B by PCR NEGATIVE  NEGATIVE Final    Comment: (NOTE) The Xpert Xpress SARS-CoV-2/FLU/RSV plus assay is intended as an aid in the diagnosis of influenza from Nasopharyngeal swab specimens and should not be used as a sole basis for treatment. Nasal washings and aspirates are unacceptable for Xpert Xpress SARS-CoV-2/FLU/RSV testing.  Fact Sheet for Patients: EntrepreneurPulse.com.au  Fact Sheet for Healthcare Providers: IncredibleEmployment.be  This test is not yet approved or cleared by the [place] FDA and has been authorized for detection and/or diagnosis of SARS-CoV-2 by FDA under an Emergency Use Authorization (EUA). This EUA will remain in effect (meaning this test can be used) for the duration of the COVID-19 declaration under Section 564(b)(1) of the Act, 21 U.S.C. section 360bbb-3(b)(1), unless the authorization is terminated or revoked.  Performed at [facility], [address] [place], [place] [address]     Radiology Reports MR FOOT RIGHT WO CONTRAST  Result Date: 11/22/2020 CLINICAL DATA:  [place]imp[place]. Acute foot pain. Infection suspected. Assess for cellulitis. Nonhealing diabetic wound to the heel. EXAM: MRI OF THE RIGHT FOREFOOT WITHOUT CONTRAST TECHNIQUE: Multiplanar, multisequence MR imaging of the right was performed. No intravenous contrast was administered. COMPARISON:  None. FINDINGS: Bones/Joint/Cartilage There is no bone marrow signal abnormality. Specifically, no evidence of osteomyelitis or edema. No evidence of a fracture. No bone lesions. Ankle joint is normally spaced and aligned. No cartilage defect. No joint effusion. Subtalar and midfoot joints are also normally spaced and aligned with no visualized cartilage defect or effusion. Ligaments Medial and lateral ligament complexes are intact. Ankle synchondrosis is intact. Plantar fascia is normal in thickness and signal. Muscles and Tendons Achilles tendon, posterior tibialis tendon and peroneal  tendons as well as the remaining ankle tendons are normal in signal and thickness. There is diffuse increased signal within the intrinsic foot musculature consistent with edema and/or cellulitis. Soft tissues Diffuse soft tissue high T2 signal extending from distal leg, crossing the ankle and into the  foot, predominating in the foot over the dorsal aspect. This is consistent with edema, cellulitis or a combination. No focal soft tissue fluid collection to suggest an abscess. No convincing soft tissue air. IMPRESSION: 1. Abnormal increased T2 signal of the soft tissues of the distal leg, ankle and visualized foot, in the foot predominantly over the dorsum. This is consistent with edema or cellulitis. Without the benefit contrast, the presence and degree of cellulitis cannot be accurately assessed. 2. No evidence of an abscess.  No evidence of soft tissue air. 3. No bone marrow signal abnormality to suggest osteomyelitis. 4. No joint abnormality.  Intact ligaments and tendons. Electronically Signed   By: Lajean Manes M.D.   On: 11/22/2020 18:54   US Venous Img Lower Bilateral (DVT)  Result Date: 11/23/2020 CLINICAL DATA:  Bilateral lower extremity swelling and cellulitis for 2 months EXAM: BILATERAL LOWER EXTREMITY VENOUS DOPPLER ULTRASOUND TECHNIQUE: Gray-scale sonography with compression, as well as color and duplex ultrasound, were performed to evaluate the deep venous system(s) from the level of the common femoral vein through the popliteal and proximal calf veins. COMPARISON:  None. FINDINGS: VENOUS Normal compressibility of the common femoral, superficial femoral, and popliteal veins, as well as the visualized calf veins. Visualized portions of profunda femoral vein and great saphenous vein unremarkable. No filling defects to suggest DVT on grayscale or color Doppler imaging. Doppler waveforms show normal direction of venous flow, normal respiratory plasticity and response to augmentation. OTHER None.  Limitations: Limited visualization of the calf veins due to edema. IMPRESSION: No lower extremity DVT Electronically Signed   By: Miachel Roux M.D.   On: 11/23/2020 10:33   DG Chest Portable 1 View  Result Date: 11/22/2020 CLINICAL DATA:  60 year old male with edema. EXAM: PORTABLE CHEST 1 VIEW COMPARISON:  Chest radiograph dated 10/25/2020. FINDINGS: There is mild eventration of the right hemidiaphragm. Left lateral pleural thickening and left mid to lower lung field atelectasis/scarring similar to prior radiograph. No new consolidation. No pleural effusion pneumothorax. Stable cardiomegaly. Median sternotomy wires and CABG vascular clips. No acute osseous pathology. IMPRESSION: 1. No acute cardiopulmonary process. 2. Stable cardiomegaly. Electronically Signed   By: Anner Crete M.D.   On: 11/22/2020 16:34   DG Foot Complete Right  Result Date: 11/22/2020 CLINICAL DATA:  Cellulitis and swelling both legs/right foot wound at heel/diabetic EXAM: RIGHT FOOT COMPLETE - 3+ VIEW COMPARISON:  None. FINDINGS: No fracture or bone lesion. No bone resorption to suggest osteomyelitis. Joints are normally spaced and aligned. There are dorsal and plantar calcaneal spurs. Diffuse soft tissue swelling most evident over the dorsal forefoot. There are arterial vascular calcifications. No soft tissue air. IMPRESSION: 1. No fracture or dislocation. 2. No evidence of osteomyelitis. 3. Soft tissue swelling without evidence of soft tissue air. Electronically Signed   By: Lajean Manes M.D.   On: 11/22/2020 14:14   DG Foot Complete Right  Result Date: 11/05/2020 CLINICAL DATA:  Pain, blister. EXAM: RIGHT FOOT COMPLETE - 3+ VIEW COMPARISON:  None. FINDINGS: There is no evidence of fracture or dislocation. Slight hammertoe deformity of the digits. No erosion or bony destruction. Degenerative change in the mid and hindfoot most prominent at the talonavicular joint. Plantar calcaneal spur and Achilles tendon enthesophyte.  Generalized soft tissue edema, prominent over the dorsum of the foot. There is also a rounded soft tissue density projecting over the plantar aspect of the calcaneus, indeterminate. No soft tissue air. No radiopaque foreign body. There are vascular calcifications. IMPRESSION: 1. Generalized  soft tissue edema. No acute osseous abnormality. 2. Rounded soft tissue density projecting over the plantar aspect of the calcaneus, indeterminate, may represent site of reported blister. Recommend correlation with physical exam. 3. Degenerative change in the mid and hindfoot. Plantar calcaneal spur and Achilles tendon enthesophyte. Electronically Signed   By: Keith Rake M.D.   On: 11/05/2020 21:10     CBC Recent Labs  Lab 11/22/20 1341 11/23/20 0507 11/25/20 0758  WBC 12.2* 12.2* 13.9*  HGB 11.2* 11.0* 10.4*  HCT 35.4* 34.9* 32.5*  PLT 324 325 317  MCV 86.1 85.7 86.2  MCH 27.3 27.0 27.6  MCHC 31.6 31.5 32.0  RDW 13.4 13.4 13.6  LYMPHSABS 1.9  --   --   MONOABS 1.1*  --   --   EOSABS 0.7*  --   --   BASOSABS 0.1  --   --     Chemistries  Recent Labs  Lab 11/22/20 1341 11/23/20 0507 11/25/20 0758  NA 136 136 137  K 3.9 3.8 3.7  CL 97* 97* 100  CO2 29 25 26   GLUCOSE 204* 221* 139*  BUN 34* 30* 29*  CREATININE 1.04 0.90 0.96  CALCIUM 8.8* 8.9 9.0   ------------------------------------------------------------------------------------------------------------------ No results for input(s): CHOL, HDL, LDLCALC, TRIG, CHOLHDL, LDLDIRECT in the last 72 hours.  Lab Results  Component Value Date   HGBA1C 11.1 (H) 11/23/2020   ------------------------------------------------------------------------------------------------------------------ No results for input(s): TSH, T4TOTAL, T3FREE, THYROIDAB in the last 72 hours.  Invalid input(s): FREET3 ------------------------------------------------------------------------------------------------------------------ No results for input(s):  VITAMINB12, FOLATE, FERRITIN, TIBC, IRON, RETICCTPCT in the last 72 hours.  Coagulation profile No results for input(s): INR, PROTIME in the last 168 hours.  No results for input(s): DDIMER in the last 72 hours.  Cardiac Enzymes No results for input(s): CKMB, TROPONINI, MYOGLOBIN in the last 168 hours.  Invalid input(s): CK ------------------------------------------------------------------------------------------------------------------    Component Value Date/Time   BNP 65.0 11/22/2020 1341   Alondra Vandeven M.D on 11/26/2020 at 7:13 PM  Go to [email or web address] - for contact info  [facility] - Office  [phone]

## 2020-11-26 NOTE — Progress Notes (Signed)
Physical Therapy Treatment Patient Details Name: Shane Chambers MRN: [id number] DOB: 24-Jun-1961 Today's Date: 11/26/2020    History of Present Illness Shane Chambers is a 60 y.o. male with medical history significant for systolic and diastolic CHF, CABG x 5, COPD, diabetes mellitus, hypertension, COVID-19 infection.  Patient presented to the ED with complaints of leg swelling and pain with draining wounds ongoing for several weeks.  Patient reports wound started small swellings that opened up draining pus. Patient was in the ED 3 times this past December - 12/6, 12/16 and 11/05/20 patient has been treated with courses of antibiotics including doxycycline and Bactrim.  Patient does not check his feet frequently, and reports when he checks his weight is unreliable.  On his last weight check a few days ago was 289, weight here in the ED is 281.  He reports his legs are currently the most swollen they have ever been.  He denies increasing abdominal gait.  No difficulty breathing.  No cough.    PT Comments    Patient presents up in chair (assisted by nursing staff) and agreeable for therapy.  Patient demonstrates good return for completing BLE ROM/strengthening exercises with verbal cueing, unable to take steps and limited to standing 2-3 minutes before having to sit due to BLE pain, swelling and discomfort.  Patient continued sitting up in chair after therapy - RN aware.  Patient will benefit from continued physical therapy in hospital and recommended venue below to increase strength, balance, endurance for safe ADLs and gait.    Follow Up Recommendations  SNF     Equipment Recommendations  None recommended by PT    Recommendations for Other Services       Precautions / Restrictions Precautions Precautions: Fall Restrictions Weight Bearing Restrictions: No    Mobility  Bed Mobility               General bed mobility comments: Patient presents up in chair  Transfers Overall  transfer level: Needs assistance Equipment used: Rolling walker (2 wheeled) Transfers: Sit to/from Stand Sit to Stand: Min assist;Mod assist         General transfer comment: Patient tolerated standing for up to 2-3 minutes before having to sit due to BLE pain/discomfort  Ambulation/Gait                 Stairs             Wheelchair Mobility    Modified Rankin (Stroke Patients Only)       Balance Overall balance assessment: Needs assistance Sitting-balance support: Feet supported;No upper extremity supported Sitting balance-Leahy Scale: Good Sitting balance - Comments: seated in chair   Standing balance support: During functional activity;Bilateral upper extremity supported Standing balance-Leahy Scale: Poor Standing balance comment: fair/poor using RW                            Cognition Arousal/Alertness: Awake/alert Behavior During Therapy: WFL for tasks assessed/performed Overall Cognitive Status: Within Functional Limits for tasks assessed                                        Exercises General Exercises - Lower Extremity Long Arc Quad: Seated;AROM;Strengthening;Both;10 reps Hip Flexion/Marching: Seated;AROM;Strengthening;Both;10 reps Toe Raises: Seated;AROM;Strengthening;Both;10 reps Heel Raises: Seated;AROM;Strengthening;Both;10 reps    General Comments        Pertinent Vitals/Pain  Pain Assessment: Faces Faces Pain Scale: Hurts little more Pain Location: BLE Pain Descriptors / Indicators: Sore;Discomfort Pain Intervention(s): Limited activity within patient's tolerance;Monitored during session;Repositioned    Home Living                      Prior Function            PT Goals (current goals can now be found in the care plan section) Acute Rehab PT Goals Patient Stated Goal: return home able to take care of self PT Goal Formulation: With patient Time For Goal Achievement: 12/09/20 Potential to  Achieve Goals: Good Progress towards PT goals: Progressing toward goals    Frequency    Min 3X/week      PT Plan      Co-evaluation              AM-PAC PT "6 Clicks" Mobility   Outcome Measure  Help needed turning from your back to your side while in a flat bed without using bedrails?: None Help needed moving from lying on your back to sitting on the side of a flat bed without using bedrails?: A Little Help needed moving to and from a bed to a chair (including a wheelchair)?: A Lot Help needed standing up from a chair using your arms (e.g., wheelchair or bedside chair)?: A Lot Help needed to walk in hospital room?: A Lot Help needed climbing 3-5 steps with a railing? : Total 6 Click Score: 14    End of Session   Activity Tolerance: Patient tolerated treatment well;Patient limited by fatigue Patient left: in chair;with call bell/phone within reach Nurse Communication: Mobility status PT Visit Diagnosis: Unsteadiness on feet (R26.81);Other abnormalities of gait and mobility (R26.89);Muscle weakness (generalized) (M62.81)     Time: 0086-7619 PT Time Calculation (min) (ACUTE ONLY): 20 min  Charges:  $Therapeutic Exercise: 8-22 mins $Therapeutic Activity: 8-22 mins                     1:11 PM, 11/26/20 Lonell Grandchild, MPT Physical Therapist with [facility] [phone] office [phone] mobile phone

## 2020-11-26 NOTE — TOC Progression Note (Signed)
Transition of Care [facility]) - Progression Note    Patient Details  Name: Shane Chambers MRN: [id number] Date of Birth: Jun 21, 1961  Transition of Care [facility]) CM/SW Contact  Shade Flood, LCSW Phone Number: 11/26/2020, 9:17 AM  Clinical Narrative:     TOC following. Spoke with pt by phone this AM to discuss dc planning. Pt is now agreeable to SNF referrals. Discussed CMS provider options with pt and will refer as requested. TOC will follow.  Expected Discharge Plan: Skilled Nursing Facility Barriers to Discharge: Continued Medical Work up  Expected Discharge Plan and Services Expected Discharge Plan: [place] In-house Referral: NA Discharge Planning Services: NA Post Acute Care Choice: NA Living arrangements for the past 2 months: Mobile Home                 DME Arranged: N/A DME Agency: NA       HH Arranged: NA HH Agency: NA         Social Determinants of Health (SDOH) Interventions    Readmission Risk Interventions Readmission Risk Prevention Plan 11/25/2020 11/23/2020 08/02/2020  Transportation Screening Complete Complete Complete  PCP or Specialist Appt within 3-5 Days - - -  HRI or [place] Work Consult for [place] Planning/Counseling - - -  [place] - - -  Medication Review Press photographer) Complete - Complete  HRI or Home Care Consult Complete - Complete  SW Recovery Care/Counseling Consult Complete Complete Complete  Palliative Care Screening Not Applicable Not Applicable Not Applicable  Skilled Nursing Facility Complete Patient Refused Not Applicable  Some recent data might be hidden

## 2020-11-26 NOTE — TOC Progression Note (Signed)
Transition of Care [facility]) - Progression Note   Patient Details  Name: Shane Chambers MRN: [id number] Date of Birth: [birth year]  Transition of Care [facility]) CM/SW [place], LCSW Phone Number: 11/26/2020, 2:03 PM  Clinical Narrative: Patient's only bed offer is [facility]. CSW called Otila Kluver with [facility] to follow up with bed offer. Per Otila Kluver, due to a COVID outbreak, the facility is creating a COVID unit so the patient will not have a bed until Friday (11/30/20) at the earliest. [facility] awaiting bed placement or other bed offers.  Expected Discharge Plan: Skilled Nursing Facility Barriers to Discharge: Continued Medical Work up  Expected Discharge Plan and Services Expected Discharge Plan: [place] In-house Referral: NA Discharge Planning Services: NA Post Acute Care Choice: NA Living arrangements for the past 2 months: Mobile Home               DME Arranged: N/A DME Agency: NA HH Arranged: NA [place] Agency: NA  Readmission Risk Interventions Readmission Risk Prevention Plan 11/25/2020 11/23/2020 08/02/2020  Transportation Screening Complete Complete Complete  PCP or Specialist Appt within 3-5 Days - - -  HRI or [place] Work Consult for [place] Planning/Counseling - - -  [place] - - -  Medication Review Press photographer) Complete - Complete  HRI or Home Care Consult Complete - Complete  SW Recovery Care/Counseling Consult Complete Complete Complete  Palliative Care Screening Not Applicable Not Applicable Not Applicable  Skilled Nursing Facility Complete Patient Refused Not Applicable  Some recent data might be hidden

## 2020-11-27 ENCOUNTER — Ambulatory Visit: Payer: Medicaid Other | Admitting: Pulmonary Disease

## 2020-11-27 LAB — GLUCOSE, CAPILLARY
Glucose-Capillary: 206 mg/dL — ABNORMAL HIGH (ref 70–99)
Glucose-Capillary: 185 mg/dL — ABNORMAL HIGH (ref 70–99)
Glucose-Capillary: 128 mg/dL — ABNORMAL HIGH (ref 70–99)
Glucose-Capillary: 137 mg/dL — ABNORMAL HIGH (ref 70–99)
Glucose-Capillary: 203 mg/dL — ABNORMAL HIGH (ref 70–99)
Glucose-Capillary: 181 mg/dL — ABNORMAL HIGH (ref 70–99)
Glucose-Capillary: 169 mg/dL — ABNORMAL HIGH (ref 70–99)
Glucose-Capillary: 123 mg/dL — ABNORMAL HIGH (ref 70–99)
Glucose-Capillary: 98 mg/dL (ref 70–99)
Glucose-Capillary: 103 mg/dL — ABNORMAL HIGH (ref 70–99)

## 2020-11-27 LAB — CBC
HCT: 34 % — ABNORMAL LOW (ref 39.0–52.0)
Hemoglobin: 10.4 g/dL — ABNORMAL LOW (ref 13.0–17.0)
MCH: 27.2 pg (ref 26.0–34.0)
MCHC: 30.6 g/dL (ref 30.0–36.0)
MCV: 89 fL (ref 80.0–100.0)
Platelets: 319 10*3/uL (ref 150–400)
RBC: 3.82 MIL/uL — ABNORMAL LOW (ref 4.22–5.81)
RDW: 13.6 % (ref 11.5–15.5)
WBC: 16.9 10*3/uL — ABNORMAL HIGH (ref 4.0–10.5)
nRBC: 0 % (ref 0.0–0.2)

## 2020-11-27 LAB — BASIC METABOLIC PANEL
Anion gap: 10 (ref 5–15)
BUN: 34 mg/dL — ABNORMAL HIGH (ref 6–20)
CO2: 27 mmol/L (ref 22–32)
Calcium: 9.2 mg/dL (ref 8.9–10.3)
Chloride: 99 mmol/L (ref 98–111)
Creatinine, Ser: 1.01 mg/dL (ref 0.61–1.24)
GFR, Estimated: >60 mL/min (ref >60)
Glucose, Bld: 153 mg/dL — ABNORMAL HIGH (ref 70–99)
Potassium: 4.2 mmol/L (ref 3.5–5.1)
Sodium: 136 mmol/L (ref 135–145)

## 2020-11-27 NOTE — Progress Notes (Signed)
Patient Demographics:    Shane Chambers, is a 60 y.o. male, DOB - Sep 15, 1961, [id number]  Admit date - 11/22/2020   Admitting Physician Ejiroghene Arlyce Dice, MD  Outpatient Primary MD for the patient is [name], MD  LOS - 5   Chief Complaint  Patient presents with  . Leg Pain        Subjective:    [name] today has no fevers, no emesis,  No chest pain,   No fever  Or chills  -Complains of fatigue  Assessment  & Plan :    Principal Problem:   Sepsis ([place]) Active Problems:   Hypertension   Type 2 diabetes mellitus (HCC)   Ischemic cardiomyopathy   S/P CABG x 5   Bilateral lower leg cellulitis   Systolic and diastolic CHF, chronic (HCC)  Brief Summary:- 60 y.o. male with medical history significant for systolic and diastolic CHF, CABG x 5, COPD, diabetes mellitus, hypertension, prior COVID-19 infection (11/2019) admitted on 11/23/2019 with bilateral lower extremity cellulitis  A/p  1) sepsis secondary to bilateral lower extremity cellulitis without osteomyelitis---  please see photos in epic -Patient met sepsis criteria on admission with leukocytosis, tachycardia and tachypnea in the setting of cellulitis  -Previously failed oral antibiotics as outpatient  -WBCs 16.9 (patient appears to have some degree of chronic leukocytosis) - continue Rocephin  Stopped IV vancomycin on 11/26/2020 and transitioned to p.o. doxycycline on 11/27/2020-- (patient had MRSA positive PCR screening back in May 2021 and again in September 2021)  2)DM2-A1c is 11.1 reflecting uncontrolled DM with hyperglycemia PTA -Continue 70/30 insulin at 55 units twice daily along with sliding scale coverage  3)HTN--stable, continue metoprolol 12.5 mg twice daily and losartan 12.5 mg daily  4)BPH--stable, continue Flomax  5)COPD--- no acute exacerbation at this time, continue Dulera  6) CAD-- s/p Prior  CABG--stable, continue aspirin and Lipitor, as well as metoprolol  7)HFpEF--- patient with chronic diastolic dysfunction CHF, Echo with EF of 55 to 60%, and grade 2 diastolic dysfunction -Lower extremity edema noted, some dyspnea on exertion noted, IV fluids as ordered  8)Generalized weakness and ambulatory dysfunction--- PT eval appreciated, -recommends SNF rehab  9) constipation-- improved after multiple doses of Lactulose and MiraLAX as ordered  10)Decubitus Ulcers/Right medial hindfoot with eschar--wound care specialist recommends twice daily cleansing of the area with soap and water, rinsing and patting dry, followed by application of a betadine swabstick and for allowing it to air-dry. Once dry, a dry dressing can be placed to protect the eschar from traumatic removal. The foot/feet are to be placed into a pressure redistribution heel boot.  Disposition/Need for in-Hospital Stay- patient unable to be discharged at this time due to sepsis secondary to cellulitis, failure of oral antibiotics as outpatient requires IV antibiotics  Status is: Inpatient  Remains inpatient appropriate because:Please see above   Disposition: The patient is from: Home              Anticipated d/c is to: SNF                 Anticipated d/c date is: 1 day              Patient currently is not medically stable to d/c. Barriers:  Not Clinically Stable-   Code Status :  -  Code Status: Full Code   Family Communication:    NA (patient is alert, awake and coherent)  Consults  :  na  DVT Prophylaxis  :   - SCDs      Lab Results  Component Value Date   PLT 319 11/27/2020    Inpatient Medications  Scheduled Meds: . aspirin EC  81 mg Oral Daily  . atorvastatin  80 mg Oral Daily  . doxycycline  100 mg Oral Q12H  . enoxaparin (LOVENOX) injection  60 mg Subcutaneous Q24H  . furosemide  40 mg Intravenous BID  . gabapentin  900 mg Oral BID  . insulin aspart  0-15 Units Subcutaneous TID WC  . insulin  aspart  0-5 Units Subcutaneous QHS  . insulin aspart protamine- aspart  50 Units Subcutaneous BID WC  . losartan  12.5 mg Oral Daily  . metoprolol succinate  12.5 mg Oral BID  . mometasone-formoterol  2 puff Inhalation BID  . pantoprazole  40 mg Oral Daily  . polyethylene glycol  17 g Oral BID  . tamsulosin  0.4 mg Oral Daily  . venlafaxine XR  150 mg Oral Q breakfast   Continuous Infusions: . cefTRIAXone (ROCEPHIN)  IV 2 g (11/26/20 2202)   PRN Meds:.acetaminophen **OR** acetaminophen, ipratropium-albuterol, ondansetron **OR** ondansetron (ZOFRAN) IV, oxyCODONE    Anti-infectives (From admission, onward)   Start     Dose/Rate Route Frequency Ordered Stop   11/27/20 1000  doxycycline (VIBRA-TABS) tablet 100 mg        100 mg Oral Every 12 hours 11/26/20 1914     11/26/20 2200  doxycycline (VIBRA-TABS) tablet 100 mg  Status:  Discontinued        100 mg Oral Every 12 hours 11/26/20 1911 11/26/20 1914   11/23/20 1000  vancomycin (VANCOCIN) IVPB 1000 mg/200 mL premix        1,000 mg 200 mL/hr over 60 Minutes Intravenous Every 12 hours 11/22/20 2121 11/26/20 2359   11/22/20 2115  vancomycin (VANCOREADY) IVPB 1500 mg/300 mL        1,500 mg 150 mL/hr over 120 Minutes Intravenous  Once 11/22/20 2112 11/23/20 0112   11/22/20 2045  cefTRIAXone (ROCEPHIN) 2 g in sodium chloride 0.9 % 100 mL IVPB        2 g 200 mL/hr over 30 Minutes Intravenous Every 24 hours 11/22/20 2037     11/22/20 1515  vancomycin (VANCOCIN) IVPB 1000 mg/200 mL premix        1,000 mg 200 mL/hr over 60 Minutes Intravenous  Once 11/22/20 1513 11/22/20 1630        Objective:   Vitals:   11/27/20 0639 11/27/20 0900 11/27/20 1049 11/27/20 1424  BP: 136/63 126/64 126/64 111/62  Pulse: 86 83 83 86  Resp: _0 Temp: 99.3 F (37.4 C) 98.6 F (37 C) 98.6 F (37 C) 98.7 F (37.1 C)  TempSrc: Oral Oral Oral Oral  SpO2: 96% 98% 98% 95%  Weight:      Height:        Wt Readings from Last 3 Encounters:   11/27/20 (!) 136.3 kg  11/05/20 129.7 kg  10/15/20 129.7 kg    Intake/Output Summary (Last 24 hours) at 11/27/2020 1714 Last data filed at 11/27/2020 1500 Gross per 24 hour  Intake --  Output 1600 ml  Net -1600 ml   Physical Exam  Gen:- Awake Alert,  Obese, no apparent distress  HEENT:- [place].AT, No sclera icterus Neck-Supple Neck,No JVD,.  Lungs-diminished in bases, no wheezing  CV- S1, S2 normal, regular  Abd-  +ve B.Sounds, Abd Soft, No tenderness,    Extremity/Skin:-  pedal pulses present , edema and cellulitis/Right medial hindfoot with eschar--- please see photos in epic  Psych-affect is appropriate, oriented x3 Neuro-generalized weakness, no new focal deficits, no tremors   Data Review:   Micro Results Recent Results (from the past 240 hour(s))  Resp Panel by RT-PCR (Flu A&B, Covid) Nasopharyngeal Swab     Status: None   Collection Time: 11/22/20  3:15 PM   Specimen: Nasopharyngeal Swab; Nasopharyngeal(NP) swabs in vial transport medium  Result Value Ref Range Status   SARS Coronavirus 2 by RT PCR NEGATIVE NEGATIVE Final    Comment: (NOTE) SARS-CoV-2 target nucleic acids are NOT DETECTED.  The SARS-CoV-2 RNA is generally detectable in upper respiratory specimens during the acute phase of infection. The lowest concentration of SARS-CoV-2 viral copies this assay can detect is 138 copies/mL. A negative result does not preclude SARS-Cov-2 infection and should not be used as the sole basis for treatment or other patient management decisions. A negative result may occur with  improper specimen collection/handling, submission of specimen other than nasopharyngeal swab, presence of viral mutation(s) within the areas targeted by this assay, and inadequate number of viral copies(<138 copies/mL). A negative result must be combined with clinical observations, patient history, and epidemiological information. The expected result is Negative.  Fact Sheet for Patients:   EntrepreneurPulse.com.au  Fact Sheet for Healthcare Providers:  IncredibleEmployment.be  This test is no t yet approved or cleared by the [place] FDA and  has been authorized for detection and/or diagnosis of SARS-CoV-2 by FDA under an Emergency Use Authorization (EUA). This EUA will remain  in effect (meaning this test can be used) for the duration of the COVID-19 declaration under Section 564(b)(1) of the Act, 21 U.S.C.section 360bbb-3(b)(1), unless the authorization is terminated  or revoked sooner.       Influenza A by PCR NEGATIVE NEGATIVE Final   Influenza B by PCR NEGATIVE NEGATIVE Final    Comment: (NOTE) The Xpert Xpress SARS-CoV-2/FLU/RSV plus assay is intended as an aid in the diagnosis of influenza from Nasopharyngeal swab specimens and should not be used as a sole basis for treatment. Nasal washings and aspirates are unacceptable for Xpert Xpress SARS-CoV-2/FLU/RSV testing.  Fact Sheet for Patients: EntrepreneurPulse.com.au  Fact Sheet for Healthcare Providers: IncredibleEmployment.be  This test is not yet approved or cleared by the [place] FDA and has been authorized for detection and/or diagnosis of SARS-CoV-2 by FDA under an Emergency Use Authorization (EUA). This EUA will remain in effect (meaning this test can be used) for the duration of the COVID-19 declaration under Section 564(b)(1) of the Act, 21 U.S.C. section 360bbb-3(b)(1), unless the authorization is terminated or revoked.  Performed at [facility], [address] [place], [place] [address]     Radiology Reports MR FOOT RIGHT WO CONTRAST  Result Date: 11/22/2020 CLINICAL DATA:  Limping. Acute foot pain. Infection suspected. Assess for cellulitis. Nonhealing diabetic wound to the heel. EXAM: MRI OF THE RIGHT FOREFOOT WITHOUT CONTRAST TECHNIQUE: Multiplanar, multisequence MR imaging of the right was performed. No  intravenous contrast was administered. COMPARISON:  None. FINDINGS: Bones/Joint/Cartilage There is no bone marrow signal abnormality. Specifically, no evidence of osteomyelitis or edema. No evidence of a fracture. No bone lesions. Ankle joint is normally spaced and aligned. No cartilage defect. No joint effusion. Subtalar  and midfoot joints are also normally spaced and aligned with no visualized cartilage defect or effusion. Ligaments Medial and lateral ligament complexes are intact. Ankle synchondrosis is intact. Plantar fascia is normal in thickness and signal. Muscles and Tendons Achilles tendon, posterior tibialis tendon and peroneal tendons as well as the remaining ankle tendons are normal in signal and thickness. There is diffuse increased signal within the intrinsic foot musculature consistent with edema and/or cellulitis. Soft tissues Diffuse soft tissue high T2 signal extending from distal leg, crossing the ankle and into the foot, predominating in the foot over the dorsal aspect. This is consistent with edema, cellulitis or a combination. No focal soft tissue fluid collection to suggest an abscess. No convincing soft tissue air. IMPRESSION: 1. Abnormal increased T2 signal of the soft tissues of the distal leg, ankle and visualized foot, in the foot predominantly over the dorsum. This is consistent with edema or cellulitis. Without the benefit contrast, the presence and degree of cellulitis cannot be accurately assessed. 2. No evidence of an abscess.  No evidence of soft tissue air. 3. No bone marrow signal abnormality to suggest osteomyelitis. 4. No joint abnormality.  Intact ligaments and tendons. Electronically Signed   By: Lajean Manes M.D.   On: 11/22/2020 18:54   US Venous Img Lower Bilateral (DVT)  Result Date: 11/23/2020 CLINICAL DATA:  Bilateral lower extremity swelling and cellulitis for 2 months EXAM: BILATERAL LOWER EXTREMITY VENOUS DOPPLER ULTRASOUND TECHNIQUE: Gray-scale sonography with  compression, as well as color and duplex ultrasound, were performed to evaluate the deep venous system(s) from the level of the common femoral vein through the popliteal and proximal calf veins. COMPARISON:  None. FINDINGS: VENOUS Normal compressibility of the common femoral, superficial femoral, and popliteal veins, as well as the visualized calf veins. Visualized portions of profunda femoral vein and great saphenous vein unremarkable. No filling defects to suggest DVT on grayscale or color Doppler imaging. Doppler waveforms show normal direction of venous flow, normal respiratory plasticity and response to augmentation. OTHER None. Limitations: Limited visualization of the calf veins due to edema. IMPRESSION: No lower extremity DVT Electronically Signed   By: Miachel Roux M.D.   On: 11/23/2020 10:33   DG Chest Portable 1 View  Result Date: 11/22/2020 CLINICAL DATA:  60 year old male with edema. EXAM: PORTABLE CHEST 1 VIEW COMPARISON:  Chest radiograph dated 10/25/2020. FINDINGS: There is mild eventration of the right hemidiaphragm. Left lateral pleural thickening and left mid to lower lung field atelectasis/scarring similar to prior radiograph. No new consolidation. No pleural effusion pneumothorax. Stable cardiomegaly. Median sternotomy wires and CABG vascular clips. No acute osseous pathology. IMPRESSION: 1. No acute cardiopulmonary process. 2. Stable cardiomegaly. Electronically Signed   By: Anner [place] M.D.   On: 11/22/2020 16:34   DG Foot Complete Right  Result Date: 11/22/2020 CLINICAL DATA:  Cellulitis and swelling both legs/right foot wound at heel/diabetic EXAM: RIGHT FOOT COMPLETE - 3+ VIEW COMPARISON:  None. FINDINGS: No fracture or bone lesion. No bone resorption to suggest osteomyelitis. Joints are normally spaced and aligned. There are dorsal and plantar calcaneal spurs. Diffuse soft tissue swelling most evident over the dorsal forefoot. There are arterial vascular calcifications. No  soft tissue air. IMPRESSION: 1. No fracture or dislocation. 2. No evidence of osteomyelitis. 3. Soft tissue swelling without evidence of soft tissue air. Electronically Signed   By: Lajean Manes M.D.   On: 11/22/2020 14:14   DG Foot Complete Right  Result Date: 11/05/2020 CLINICAL DATA:  Pain, blister.  EXAM: RIGHT FOOT COMPLETE - 3+ VIEW COMPARISON:  None. FINDINGS: There is no evidence of fracture or dislocation. Slight hammertoe deformity of the digits. No erosion or bony destruction. Degenerative change in the mid and hindfoot most prominent at the talonavicular joint. Plantar calcaneal spur and Achilles tendon enthesophyte. Generalized soft tissue edema, prominent over the dorsum of the foot. There is also a rounded soft tissue density projecting over the plantar aspect of the calcaneus, indeterminate. No soft tissue air. No radiopaque foreign body. There are vascular calcifications. IMPRESSION: 1. Generalized soft tissue edema. No acute osseous abnormality. 2. Rounded soft tissue density projecting over the plantar aspect of the calcaneus, indeterminate, may represent site of reported blister. Recommend correlation with physical exam. 3. Degenerative change in the mid and hindfoot. Plantar calcaneal spur and Achilles tendon enthesophyte. Electronically Signed   By: Keith Rake M.D.   On: 11/05/2020 21:10     CBC Recent Labs  Lab 11/22/20 1341 11/23/20 0507 11/25/20 0758 11/27/20 0647  WBC 12.2* 12.2* 13.9* 16.9*  HGB 11.2* 11.0* 10.4* 10.4*  HCT 35.4* 34.9* 32.5* 34.0*  PLT 324 325 317 319  MCV 86.1 85.7 86.2 89.0  MCH 27.3 27.0 27.6 27.2  MCHC 31.6 31.5 32.0 30.6  RDW 13.4 13.4 13.6 13.6  LYMPHSABS 1.9  --   --   --   MONOABS 1.1*  --   --   --   EOSABS 0.7*  --   --   --   BASOSABS 0.1  --   --   --     Chemistries  Recent Labs  Lab 11/22/20 1341 11/23/20 0507 11/25/20 0758 11/27/20 0647  NA 136 136 137 136  K 3.9 3.8 3.7 4.2  CL 97* 97* 100 99  CO2 _0 GLUCOSE 204* 221* 139* 153*  BUN 34* 30* 29* 34*  CREATININE 1.04 0.90 0.96 1.01  CALCIUM 8.8* 8.9 9.0 9.2   ------------------------------------------------------------------------------------------------------------------ No results for input(s): CHOL, HDL, LDLCALC, TRIG, CHOLHDL, LDLDIRECT in the last 72 hours.  Lab Results  Component Value Date   HGBA1C 11.1 (H) 11/23/2020   ------------------------------------------------------------------------------------------------------------------ No results for input(s): TSH, T4TOTAL, T3FREE, THYROIDAB in the last 72 hours.  Invalid input(s): FREET3 ------------------------------------------------------------------------------------------------------------------ No results for input(s): VITAMINB12, FOLATE, FERRITIN, TIBC, IRON, RETICCTPCT in the last 72 hours.  Coagulation profile No results for input(s): INR, PROTIME in the last 168 hours.  No results for input(s): DDIMER in the last 72 hours.  Cardiac Enzymes No results for input(s): CKMB, TROPONINI, MYOGLOBIN in the last 168 hours.  Invalid input(s): CK ------------------------------------------------------------------------------------------------------------------    Component Value Date/Time   BNP 65.0 11/22/2020 1341   Locklan Canoy M.D on 11/27/2020 at 5:14 PM  Go to [email or web address] - for contact info  [facility] - Office  [phone]

## 2020-11-27 NOTE — Plan of Care (Signed)

## 2020-11-27 NOTE — Consult Note (Signed)
[facility] Nurse Consult Note: Reason for Consult: Right medial hindfoot with eschar.  Photos taken yesterday (11/26/20) provided in EMR are appreciated and show progression of this wound from and ED visit on 11/05/20 where a large blood-filled blister presented in this location. Wound type:Unstageable  Pressure Injury POA: Yes Measurement:TO be obtained by the Bedside RN today prior to first dressing and documented on the Nursing Flow Sheet Wound bed:90% stable black eschar with 10% yellow wound base evident at periphery where eschar is shrinking and pulling away from edge.  Drainage (amount, consistency, odor) None Periwound:dry Dressing procedure/placement/frequency: I will implement a supportive but conservative POC for this wound to continue the drying of the eschar in hope that it will spontaneously fall off when collagen and fibrin bonds holding it have dried and are broken. I have provided Nursing with guidance for the twice daily cleansing of the area with soap and water, rinsing and patting dry, followed by application of a betadine swabstick and for allowing it to air-dry. Once dry, a dry dressing can be placed to protect the eschar from traumatic removal. The foot/feet are to be placed into a pressure redistribution heel boot.  If desired, consultation can be made to Podiatric Medicine for further input into the POC.    A sacral foam is to be placed prophylactically over the sacrum and a pressure redistribution chair cushion used in the chair when patient is OOB for pressure injury prevention.  [facility] nursing team will not follow, but will remain available to this patient, the nursing and medical teams.  Please re-consult if needed. Thanks, Maudie Flakes, MSN, RN, [place], Arther Abbott  Pager# [phone]

## 2020-11-27 NOTE — TOC Progression Note (Signed)
Transition of Care [facility]) - Progression Note    Patient Details  Name: Shane Chambers MRN: [id number] Date of Birth: [birth year]  Transition of Care [facility]) CM/SW Contact  Natasha Bence, LCSW Phone Number: 11/27/2020, 12:22 PM  Clinical Narrative:    CSW informed patient of bed offer. Patient agreeable to [facility]. CSW informed patient of expected bed availability date at SNF. TOC to follow.   Expected Discharge Plan: Skilled Nursing Facility Barriers to Discharge: Continued Medical Work up  Expected Discharge Plan and Services Expected Discharge Plan: [place] In-house Referral: NA Discharge Planning Services: NA Post Acute Care Choice: NA Living arrangements for the past 2 months: Mobile Home                 DME Arranged: N/A DME Agency: NA       HH Arranged: NA HH Agency: NA         Social Determinants of Health (SDOH) Interventions    Readmission Risk Interventions Readmission Risk Prevention Plan 11/25/2020 11/23/2020 08/02/2020  Transportation Screening Complete Complete Complete  PCP or Specialist Appt within 3-5 Days - - -  HRI or [place] Work Consult for [place] Planning/Counseling - - -  [place] - - -  Medication Review Press photographer) Complete - Complete  HRI or Home Care Consult Complete - Complete  SW Recovery Care/Counseling Consult Complete Complete Complete  Palliative Care Screening Not Applicable Not Applicable Not Applicable  Skilled Nursing Facility Complete Patient Refused Not Applicable  Some recent data might be hidden

## 2020-11-28 LAB — GLUCOSE, CAPILLARY
Glucose-Capillary: 111 mg/dL — ABNORMAL HIGH (ref 70–99)
Glucose-Capillary: 166 mg/dL — ABNORMAL HIGH (ref 70–99)
Glucose-Capillary: 134 mg/dL — ABNORMAL HIGH (ref 70–99)

## 2020-11-28 MED ORDER — LACTULOSE 10 GM/15ML PO SOLN
30.0000 g | Freq: Once | ORAL | Status: AC
  Administered 2020-11-28: 30 g via ORAL
  Filled 2020-11-28: qty 60

## 2020-11-28 NOTE — Progress Notes (Signed)
Patient Demographics:    Shane Chambers, is a 60 y.o. male, DOB - January 24, 1961, AOZ:[id number]  Admit date - 11/22/2020   Admitting Physician Ejiroghene Arlyce Dice, MD  Outpatient Primary MD for the patient is [name], MD  LOS - 6   Chief Complaint  Patient presents with  . Leg Pain        Subjective:    Shane Chambers today has no fevers, no emesis,  No chest pain,   No fever  Or chills  -No further BM  Assessment  & Plan :    Principal Problem:   Sepsis ([place]) Active Problems:   Hypertension   Type 2 diabetes mellitus (HCC)   Ischemic cardiomyopathy   S/P CABG x 5   Bilateral lower leg cellulitis   Systolic and diastolic CHF, chronic (HCC)  Brief Summary:- 60 y.o. male with medical history significant for systolic and diastolic CHF, CABG x 5, COPD, diabetes mellitus, hypertension, prior COVID-19 infection (11/2019) admitted on 11/23/2019 with bilateral lower extremity cellulitis  A/p  1) sepsis secondary to bilateral lower extremity cellulitis without osteomyelitis---  please see photos in epic -Patient met sepsis criteria on admission with leukocytosis, tachycardia and tachypnea in the setting of cellulitis  -Previously failed oral antibiotics as outpatient  -WBCs 16.9 (patient appears to have some degree of chronic leukocytosis) -continue Rocephin  Stopped IV vancomycin on 11/26/2020 and transitioned to p.o. doxycycline on 11/27/2020-- (patient had MRSA positive PCR screening back in May 2021 and again in September 2021)  2)DM2-A1c is 11.1 reflecting uncontrolled DM with hyperglycemia PTA -Continue 70/30 insulin at 55 units twice daily along with sliding scale coverage  3)HTN--stable, continue metoprolol 12.5 mg twice daily and losartan 12.5 mg daily  4)BPH--stable, continue Flomax  5)COPD--- no acute exacerbation at this time, continue Dulera  6) CAD-- s/p Prior CABG--stable,  continue aspirin and Lipitor, as well as metoprolol  7)HFpEF--- patient with chronic diastolic dysfunction CHF, Echo with EF of 55 to 60%, and grade 2 diastolic dysfunction -Lower extremity edema noted, some dyspnea on exertion noted, IV fluids as ordered  8)Generalized weakness and ambulatory dysfunction--- PT eval appreciated, -recommends SNF rehab  9) constipation-- improved after multiple doses of Lactulose and MiraLAX as ordered  10)Decubitus Ulcers/Right medial hindfoot with eschar--wound care specialist recommends twice daily cleansing of the area with soap and water, rinsing and patting dry, followed by application of a betadine swabstick and for allowing it to air-dry. Once dry, a dry dressing can be placed to protect the eschar from traumatic removal. The foot/feet are to be placed into a pressure redistribution heel boot.  Disposition/Need for in-Hospital Stay- patient unable to be discharged at this time due to  awaiting transfer to SNF rehab  Status is: Inpatient  Remains inpatient appropriate because:Please see above   Disposition: The patient is from: Home              Anticipated d/c is to: SNF                 Anticipated d/c date is: 1 day              Patient currently is medically stable to d/c. awaiting transfer to SNF rehab   Code Status :  -  Code Status: Full Code   Family Communication:    NA (patient is alert, awake and coherent)  Consults  :  na  DVT Prophylaxis  :   - SCDs      Lab Results  Component Value Date   PLT 319 11/27/2020    Inpatient Medications  Scheduled Meds: . aspirin EC  81 mg Oral Daily  . atorvastatin  80 mg Oral Daily  . doxycycline  100 mg Oral Q12H  . enoxaparin (LOVENOX) injection  60 mg Subcutaneous Q24H  . furosemide  40 mg Intravenous BID  . gabapentin  900 mg Oral BID  . insulin aspart  0-15 Units Subcutaneous TID WC  . insulin aspart  0-5 Units Subcutaneous QHS  . insulin aspart protamine- aspart  50 Units  Subcutaneous BID WC  . losartan  12.5 mg Oral Daily  . metoprolol succinate  12.5 mg Oral BID  . mometasone-formoterol  2 puff Inhalation BID  . pantoprazole  40 mg Oral Daily  . polyethylene glycol  17 g Oral BID  . tamsulosin  0.4 mg Oral Daily  . venlafaxine XR  150 mg Oral Q breakfast   Continuous Infusions: . cefTRIAXone (ROCEPHIN)  IV 2 g (11/27/20 2120)   PRN Meds:.acetaminophen **OR** acetaminophen, ipratropium-albuterol, ondansetron **OR** ondansetron (ZOFRAN) IV, oxyCODONE    Anti-infectives (From admission, onward)   Start     Dose/Rate Route Frequency Ordered Stop   11/27/20 1000  doxycycline (VIBRA-TABS) tablet 100 mg        100 mg Oral Every 12 hours 11/26/20 1914     11/26/20 2200  doxycycline (VIBRA-TABS) tablet 100 mg  Status:  Discontinued        100 mg Oral Every 12 hours 11/26/20 1911 11/26/20 1914   11/23/20 1000  vancomycin (VANCOCIN) IVPB 1000 mg/200 mL premix        1,000 mg 200 mL/hr over 60 Minutes Intravenous Every 12 hours 11/22/20 2121 11/26/20 2359   11/22/20 2115  vancomycin (VANCOREADY) IVPB 1500 mg/300 mL        1,500 mg 150 mL/hr over 120 Minutes Intravenous  Once 11/22/20 2112 11/23/20 0112   11/22/20 2045  cefTRIAXone (ROCEPHIN) 2 g in sodium chloride 0.9 % 100 mL IVPB        2 g 200 mL/hr over 30 Minutes Intravenous Every 24 hours 11/22/20 2037     11/22/20 1515  vancomycin (VANCOCIN) IVPB 1000 mg/200 mL premix        1,000 mg 200 mL/hr over 60 Minutes Intravenous  Once 11/22/20 1513 11/22/20 1630        Objective:   Vitals:   11/27/20 2207 11/28/20 0538 11/28/20 0808 11/28/20 1408  BP: 116/61 (!) 131/57  (!) 103/52  Pulse: 77 74  80  Resp: 20 20    Temp: (!) 97.3 F (36.3 C) 98.2 F (36.8 C)  98.6 F (37 C)  TempSrc: Oral Oral  Oral  SpO2: 96% 100% 96% 98%  Weight:      Height:        Wt Readings from Last 3 Encounters:  11/27/20 (!) 136.3 kg  11/05/20 129.7 kg  10/15/20 129.7 kg    Intake/Output Summary (Last 24 hours)  at 11/28/2020 1627 Last data filed at 11/28/2020 0926 Gross per 24 hour  Intake 240 ml  Output 1325 ml  Net -1085 ml   Physical Exam  Gen:- Awake Alert,  Obese, no apparent distress  HEENT:- [place].AT, No sclera icterus Neck-Supple Neck,No JVD,.  Lungs-diminished in bases, no wheezing  CV- S1, S2 normal, regular  Abd-  +ve B.Sounds, Abd Soft, No tenderness,    Extremity/Skin:-  pedal pulses present , edema and cellulitis/Right medial hindfoot with eschar--- please see photos in epic  Psych-affect is appropriate, oriented x3 Neuro-generalized weakness, no new focal deficits, no tremors   Data Review:   Micro Results Recent Results (from the past 240 hour(s))  Resp Panel by RT-PCR (Flu A&B, Covid) Nasopharyngeal Swab     Status: None   Collection Time: 11/22/20  3:15 PM   Specimen: Nasopharyngeal Swab; Nasopharyngeal(NP) swabs in vial transport medium  Result Value Ref Range Status   SARS Coronavirus 2 by RT PCR NEGATIVE NEGATIVE Final    Comment: (NOTE) SARS-CoV-2 target nucleic acids are NOT DETECTED.  The SARS-CoV-2 RNA is generally detectable in upper respiratory specimens during the acute phase of infection. The lowest concentration of SARS-CoV-2 viral copies this assay can detect is 138 copies/mL. A negative result does not preclude SARS-Cov-2 infection and should not be used as the sole basis for treatment or other patient management decisions. A negative result may occur with  improper specimen collection/handling, submission of specimen other than nasopharyngeal swab, presence of viral mutation(s) within the areas targeted by this assay, and inadequate number of viral copies(<138 copies/mL). A negative result must be combined with clinical observations, patient history, and epidemiological information. The expected result is Negative.  Fact Sheet for Patients:  EntrepreneurPulse.com.au  Fact Sheet for Healthcare Providers:   IncredibleEmployment.be  This test is no t yet approved or cleared by the [place] FDA and  has been authorized for detection and/or diagnosis of SARS-CoV-2 by FDA under an Emergency Use Authorization (EUA). This EUA will remain  in effect (meaning this test can be used) for the duration of the COVID-19 declaration under Section 564(b)(1) of the Act, 21 U.S.C.section 360bbb-3(b)(1), unless the authorization is terminated  or revoked sooner.       Influenza A by PCR NEGATIVE NEGATIVE Final   Influenza B by PCR NEGATIVE NEGATIVE Final    Comment: (NOTE) The Xpert Xpress SARS-CoV-2/FLU/RSV plus assay is intended as an aid in the diagnosis of influenza from Nasopharyngeal swab specimens and should not be used as a sole basis for treatment. Nasal washings and aspirates are unacceptable for Xpert Xpress SARS-CoV-2/FLU/RSV testing.  Fact Sheet for Patients: EntrepreneurPulse.com.au  Fact Sheet for Healthcare Providers: IncredibleEmployment.be  This test is not yet approved or cleared by the [place] FDA and has been authorized for detection and/or diagnosis of SARS-CoV-2 by FDA under an Emergency Use Authorization (EUA). This EUA will remain in effect (meaning this test can be used) for the duration of the COVID-19 declaration under Section 564(b)(1) of the Act, 21 U.S.C. section 360bbb-3(b)(1), unless the authorization is terminated or revoked.  Performed at [facility], [address] [place], [place] [address]     Radiology Reports MR FOOT RIGHT WO CONTRAST  Result Date: 11/22/2020 CLINICAL DATA:  Limping. Acute foot pain. Infection suspected. Assess for cellulitis. Nonhealing diabetic wound to the heel. EXAM: MRI OF THE RIGHT FOREFOOT WITHOUT CONTRAST TECHNIQUE: Multiplanar, multisequence MR imaging of the right was performed. No intravenous contrast was administered. COMPARISON:  None. FINDINGS:  Bones/Joint/Cartilage There is no bone marrow signal abnormality. Specifically, no evidence of osteomyelitis or edema. No evidence of a fracture. No bone lesions. Ankle joint is normally spaced and aligned. No cartilage defect. No joint effusion. Subtalar and midfoot joints are also normally spaced and aligned  with no visualized cartilage defect or effusion. Ligaments Medial and lateral ligament complexes are intact. Ankle synchondrosis is intact. Plantar fascia is normal in thickness and signal. Muscles and Tendons Achilles tendon, posterior tibialis tendon and peroneal tendons as well as the remaining ankle tendons are normal in signal and thickness. There is diffuse increased signal within the intrinsic foot musculature consistent with edema and/or cellulitis. Soft tissues Diffuse soft tissue high T2 signal extending from distal leg, crossing the ankle and into the foot, predominating in the foot over the dorsal aspect. This is consistent with edema, cellulitis or a combination. No focal soft tissue fluid collection to suggest an abscess. No convincing soft tissue air. IMPRESSION: 1. Abnormal increased T2 signal of the soft tissues of the distal leg, ankle and visualized foot, in the foot predominantly over the dorsum. This is consistent with edema or cellulitis. Without the benefit contrast, the presence and degree of cellulitis cannot be accurately assessed. 2. No evidence of an abscess.  No evidence of soft tissue air. 3. No bone marrow signal abnormality to suggest osteomyelitis. 4. No joint abnormality.  Intact ligaments and tendons. Electronically Signed   By: Lajean Manes M.D.   On: 11/22/2020 18:54   US Venous Img Lower Bilateral (DVT)  Result Date: 11/23/2020 CLINICAL DATA:  Bilateral lower extremity swelling and cellulitis for 2 months EXAM: BILATERAL LOWER EXTREMITY VENOUS DOPPLER ULTRASOUND TECHNIQUE: Gray-scale sonography with compression, as well as color and duplex ultrasound, were performed  to evaluate the deep venous system(s) from the level of the common femoral vein through the popliteal and proximal calf veins. COMPARISON:  None. FINDINGS: VENOUS Normal compressibility of the common femoral, superficial femoral, and popliteal veins, as well as the visualized calf veins. Visualized portions of profunda femoral vein and great saphenous vein unremarkable. No filling defects to suggest DVT on grayscale or color Doppler imaging. Doppler waveforms show normal direction of venous flow, normal respiratory plasticity and response to augmentation. OTHER None. Limitations: Limited visualization of the calf veins due to edema. IMPRESSION: No lower extremity DVT Electronically Signed   By: Miachel Roux M.D.   On: 11/23/2020 10:33   DG Chest Portable 1 View  Result Date: 11/22/2020 CLINICAL DATA:  60 year old male with edema. EXAM: PORTABLE CHEST 1 VIEW COMPARISON:  Chest radiograph dated 10/25/2020. FINDINGS: There is mild eventration of the right hemidiaphragm. Left lateral pleural thickening and left mid to lower lung field atelectasis/scarring similar to prior radiograph. No new consolidation. No pleural effusion pneumothorax. Stable cardiomegaly. Median sternotomy wires and CABG vascular clips. No acute osseous pathology. IMPRESSION: 1. No acute cardiopulmonary process. 2. Stable cardiomegaly. Electronically Signed   By: Anner Crete M.D.   On: 11/22/2020 16:34   DG Foot Complete Right  Result Date: 11/22/2020 CLINICAL DATA:  Cellulitis and swelling both legs/right foot wound at heel/diabetic EXAM: RIGHT FOOT COMPLETE - 3+ VIEW COMPARISON:  None. FINDINGS: No fracture or bone lesion. No bone resorption to suggest osteomyelitis. Joints are normally spaced and aligned. There are dorsal and plantar calcaneal spurs. Diffuse soft tissue swelling most evident over the dorsal forefoot. There are arterial vascular calcifications. No soft tissue air. IMPRESSION: 1. No fracture or dislocation. 2. No  evidence of osteomyelitis. 3. Soft tissue swelling without evidence of soft tissue air. Electronically Signed   By: Lajean Manes M.D.   On: 11/22/2020 14:14   DG Foot Complete Right  Result Date: 11/05/2020 CLINICAL DATA:  Pain, blister. EXAM: RIGHT FOOT COMPLETE - 3+ VIEW COMPARISON:  None. FINDINGS: There is no evidence of fracture or dislocation. Slight hammertoe deformity of the digits. No erosion or bony destruction. Degenerative change in the mid and hindfoot most prominent at the talonavicular joint. Plantar calcaneal spur and Achilles tendon enthesophyte. Generalized soft tissue edema, prominent over the dorsum of the foot. There is also a rounded soft tissue density projecting over the plantar aspect of the calcaneus, indeterminate. No soft tissue air. No radiopaque foreign body. There are vascular calcifications. IMPRESSION: 1. Generalized soft tissue edema. No acute osseous abnormality. 2. Rounded soft tissue density projecting over the plantar aspect of the calcaneus, indeterminate, may represent site of reported blister. Recommend correlation with physical exam. 3. Degenerative change in the mid and hindfoot. Plantar calcaneal spur and Achilles tendon enthesophyte. Electronically Signed   By: Keith Rake M.D.   On: 11/05/2020 21:10     CBC Recent Labs  Lab 11/22/20 1341 11/23/20 0507 11/25/20 0758 11/27/20 0647  WBC 12.2* 12.2* 13.9* 16.9*  HGB 11.2* 11.0* 10.4* 10.4*  HCT 35.4* 34.9* 32.5* 34.0*  PLT 324 325 317 319  MCV 86.1 85.7 86.2 89.0  MCH 27.3 27.0 27.6 27.2  MCHC 31.6 31.5 32.0 30.6  RDW 13.4 13.4 13.6 13.6  LYMPHSABS 1.9  --   --   --   MONOABS 1.1*  --   --   --   EOSABS 0.7*  --   --   --   BASOSABS 0.1  --   --   --     Chemistries  Recent Labs  Lab 11/22/20 1341 11/23/20 0507 11/25/20 0758 11/27/20 0647  NA 136 136 137 136  K 3.9 3.8 3.7 4.2  CL 97* 97* 100 99  CO2 _0 GLUCOSE 204* 221* 139* 153*  BUN 34* 30* 29* 34*  CREATININE 1.04  0.90 0.96 1.01  CALCIUM 8.8* 8.9 9.0 9.2   ------------------------------------------------------------------------------------------------------------------ No results for input(s): CHOL, HDL, LDLCALC, TRIG, CHOLHDL, LDLDIRECT in the last 72 hours.  Lab Results  Component Value Date   HGBA1C 11.1 (H) 11/23/2020   ------------------------------------------------------------------------------------------------------------------ No results for input(s): TSH, T4TOTAL, T3FREE, THYROIDAB in the last 72 hours.  Invalid input(s): FREET3 ------------------------------------------------------------------------------------------------------------------ No results for input(s): VITAMINB12, FOLATE, FERRITIN, TIBC, IRON, RETICCTPCT in the last 72 hours.  Coagulation profile No results for input(s): INR, PROTIME in the last 168 hours.  No results for input(s): DDIMER in the last 72 hours.  Cardiac Enzymes No results for input(s): CKMB, TROPONINI, MYOGLOBIN in the last 168 hours.  Invalid input(s): CK ------------------------------------------------------------------------------------------------------------------    Component Value Date/Time   BNP 65.0 11/22/2020 1341   Jasmeet Manton M.D on 11/28/2020 at 4:27 PM  Go to [email or web address] - for contact info  [facility] - Office  [phone]

## 2020-11-28 NOTE — Plan of Care (Signed)
  Problem: Education: °Goal: Knowledge of General Education information will improve °Description: Including pain rating scale, medication(s)/side effects and non-pharmacologic comfort measures °Outcome: Progressing °  °Problem: Health Behavior/Discharge Planning: °Goal: Ability to manage health-related needs will improve °Outcome: Progressing °  °Problem: Clinical Measurements: °Goal: Ability to maintain clinical measurements within normal limits will improve °Outcome: Progressing °Goal: Will remain free from infection °Outcome: Progressing °Goal: Diagnostic test results will improve °Outcome: Progressing °Goal: Respiratory complications will improve °Outcome: Progressing °Goal: Cardiovascular complication will be avoided °Outcome: Progressing °  °Problem: Coping: °Goal: Level of anxiety will decrease °Outcome: Progressing °  °Problem: Nutrition: °Goal: Adequate nutrition will be maintained °Outcome: Progressing °  °Problem: Elimination: °Goal: Will not experience complications related to bowel motility °Outcome: Progressing °Goal: Will not experience complications related to urinary retention °Outcome: Progressing °  °Problem: Pain Managment: °Goal: General experience of comfort will improve °Outcome: Progressing °  °Problem: Safety: °Goal: Ability to remain free from injury will improve °Outcome: Progressing °  °Problem: Skin Integrity: °Goal: Risk for impaired skin integrity will decrease °Outcome: Progressing °  °

## 2020-11-28 NOTE — TOC Progression Note (Signed)
Transition of Care [facility]) - Progression Note    Patient Details  Name: Shane Chambers MRN: [id number] Date of Birth: 02-09-1961  Transition of Care [facility]) CM/SW Contact  Shade Flood, LCSW Phone Number: 11/28/2020, 3:31 PM  Clinical Narrative:     TOC following. Spoke with Bryson Ha at [facility] regarding bed offer made in the hub. Per Bryson Ha, the bed offer is pending their verification of pt's Medicaid benefits and Disability income. Bryson Ha unable to say how long she thinks this will take.  Spoke with Loma Boston at [facility] regarding pt's status at their facility. Per Loma Boston, she is still looking at being able to take pt on Friday this week. She will update this LCSW if they can take him sooner.   TOC will follow.  Expected Discharge Plan: Skilled Nursing Facility Barriers to Discharge: Continued Medical Work up  Expected Discharge Plan and Services Expected Discharge Plan: [place] In-house Referral: NA Discharge Planning Services: NA Post Acute Care Choice: NA Living arrangements for the past 2 months: Mobile Home                 DME Arranged: N/A DME Agency: NA       HH Arranged: NA HH Agency: NA         Social Determinants of Health (SDOH) Interventions    Readmission Risk Interventions Readmission Risk Prevention Plan 11/25/2020 11/23/2020 08/02/2020  Transportation Screening Complete Complete Complete  PCP or Specialist Appt within 3-5 Days - - -  HRI or [place] Work Consult for [place] Planning/Counseling - - -  [place] - - -  Medication Review Press photographer) Complete - Complete  HRI or Home Care Consult Complete - Complete  SW Recovery Care/Counseling Consult Complete Complete Complete  Palliative Care Screening Not Applicable Not Applicable Not Applicable  Skilled Nursing Facility Complete Patient Refused Not Applicable  Some recent data might be hidden

## 2020-11-29 LAB — CBC
HCT: 33.5 % — ABNORMAL LOW (ref 39.0–52.0)
Hemoglobin: 10.2 g/dL — ABNORMAL LOW (ref 13.0–17.0)
MCH: 26.9 pg (ref 26.0–34.0)
MCHC: 30.4 g/dL (ref 30.0–36.0)
MCV: 88.4 fL (ref 80.0–100.0)
Platelets: 362 10*3/uL (ref 150–400)
RBC: 3.79 MIL/uL — ABNORMAL LOW (ref 4.22–5.81)
RDW: 13.3 % (ref 11.5–15.5)
WBC: 14.5 10*3/uL — ABNORMAL HIGH (ref 4.0–10.5)
nRBC: 0 % (ref 0.0–0.2)

## 2020-11-29 LAB — GLUCOSE, CAPILLARY
Glucose-Capillary: 146 mg/dL — ABNORMAL HIGH (ref 70–99)
Glucose-Capillary: 117 mg/dL — ABNORMAL HIGH (ref 70–99)

## 2020-11-29 LAB — SARS CORONAVIRUS 2 BY RT PCR (HOSPITAL ORDER, PERFORMED IN ~~LOC~~ HOSPITAL LAB): SARS Coronavirus 2: NEGATIVE

## 2020-11-29 MED ORDER — CEFDINIR 300 MG PO CAPS: 300.0000 mg | ORAL_CAPSULE | Freq: Two times a day (BID) | ORAL | 0 refills | Status: AC

## 2020-11-29 MED ORDER — DOXYCYCLINE HYCLATE 100 MG PO TABS: 100.0000 mg | ORAL_TABLET | Freq: Two times a day (BID) | ORAL | 0 refills | Status: AC

## 2020-11-29 MED ORDER — OXYCODONE HCL 5 MG PO TABS
5.0000 mg | ORAL_TABLET | Freq: Two times a day (BID) | ORAL | 0 refills | Status: DC | PRN
Start: 2020-11-29 — End: 2021-07-03

## 2020-11-29 MED ORDER — SENNOSIDES-DOCUSATE SODIUM 8.6-50 MG PO TABS: 2.0000 | ORAL_TABLET | Freq: Every day | ORAL | 1 refills | Status: DC

## 2020-11-29 MED ORDER — ASPIRIN 81 MG PO TBEC: 81.0000 mg | DELAYED_RELEASE_TABLET | Freq: Every day | ORAL | 12 refills | Status: AC

## 2020-11-29 MED ORDER — ENOXAPARIN SODIUM 80 MG/0.8ML ~~LOC~~ SOLN: 70.0000 mg | SUBCUTANEOUS | Status: DC

## 2020-11-29 MED ORDER — INSULIN ASPART 100 UNIT/ML FLEXPEN: 0.0000 [IU] | PEN_INJECTOR | Freq: Three times a day (TID) | SUBCUTANEOUS | 11 refills | Status: DC

## 2020-11-29 MED ORDER — POLYETHYLENE GLYCOL 3350 17 G PO PACK: 17.0000 g | PACK | Freq: Two times a day (BID) | ORAL | 1 refills | Status: DC

## 2020-11-29 MED ORDER — ACETAMINOPHEN 325 MG PO TABS: 650.0000 mg | ORAL_TABLET | Freq: Four times a day (QID) | ORAL | 4 refills | Status: DC | PRN

## 2020-11-29 MED ORDER — LACTULOSE 10 GM/15ML PO SOLN
30.0000 g | Freq: Once | ORAL | Status: AC
  Administered 2020-11-29: 30 g via ORAL
  Filled 2020-11-29: qty 60

## 2020-11-29 MED ORDER — TORSEMIDE 20 MG PO TABS: 40.0000 mg | ORAL_TABLET | Freq: Every day | ORAL | 2 refills | Status: DC

## 2020-11-29 NOTE — Discharge Summary (Signed)
Shane Chambers, is a 60 y.o. male  DOB 06-19-1961  MRN [id number].  Admission date:  11/22/2020  Admitting Physician  Bethena Roys, MD  Discharge Date:  11/29/2020   Primary MD  [name], MD  Recommendations for primary care physician for things to follow:   1)Avoid ibuprofen/Advil/Aleve/Motrin/Goody Powders/Naproxen/BC powders/Meloxicam/Diclofenac/Indomethacin and other Nonsteroidal anti-inflammatory medications as these will make you more likely to bleed and can cause stomach ulcers, can also cause Kidney problems.   2)---Sliding scale Insulin Coverage---:insulin aspart (novoLOG) injection 0- 15 Units 0-15 Units Subcutaneous, 3 times daily with meals CBG < 70: Implement Hypoglycemia Standing Orders and refer to Hypoglycemia Standing Orders sidebar report  CBG 70 - 120: 0 unit CBG 121 - 150: 0 unit  CBG 151 - 200: 4 unit CBG 201 - 250:  7units CBG 251 - 300:  9 units CBG 301 - 350:  11 units  CBG 351 - 400:  13units  CBG > 400: 15 units  3)Very low-salt diet advised 4)Weigh yourself daily, call if you gain more than 3 pounds in 1 day or more than 5 pounds in 1 week as your diuretic medications may need to be adjusted 5)Limit your Fluid  intake to no more than 60 ounces (1.8 Liters) per day 6)Wound care to right medial foot Unstageable pressure injury:  Cleanse with soap and water, rinse and pat dry. Paint eschar with betadine swabstick and allow to air dry. When dry, top with dry gauze 4x4s and secure with a few turns of Kerlix roll gauze/paper tape. Place foot into Prevalon boot  Admission Diagnosis  Bilateral lower leg cellulitis [L03.116, L03.115] Cellulitis of lower extremity, unspecified laterality [L03.119]   Discharge Diagnosis  Bilateral lower leg cellulitis [L03.116, L03.115] Cellulitis of lower extremity, unspecified laterality [L03.119]    Principal Problem:   Sepsis ([place]) Active  Problems:   Hypertension   Type 2 diabetes mellitus (HCC)   Ischemic cardiomyopathy   S/P CABG x 5   Bilateral lower leg cellulitis   Systolic and diastolic CHF, chronic (HCC)      Past Medical History:  Diagnosis Date  . CAD (coronary artery disease)    a. s/p CABG in 03/2020 with LIMA-LAD, RIMA-PL, RA-D1-RI-OM1  . Cellulitis   . Depression   . Diabetes mellitus without complication ([place])   . Hyperlipidemia 12/01/2019  . Hypertension   . Ischemic cardiomyopathy    a. EF 20-25% by echo in 02/2020 b. at 40% by echo in 03/2020 c. EF normalized to 60-65% by echo in 04/2020  . Type 2 diabetes mellitus ([place])     Past Surgical History:  Procedure Laterality Date  . CORONARY ARTERY BYPASS GRAFT N/A 03/13/2020   Procedure: CORONARY ARTERY BYPASS GRAFTING (CABG) times five using bilateral Internal mammary arteries and left radial artery;  Surgeon: [name], MD;  Location: [facility];  Service: Open Heart Surgery;  Laterality: N/A;  . DENTAL SURGERY    . RADIAL ARTERY HARVEST Left 03/13/2020   Procedure: RADIAL ARTERY HARVEST,;  Surgeon: [name], MD;  Location: [facility];  Service: Open Heart Surgery;  Laterality: Left;  . RIGHT/LEFT HEART CATH AND CORONARY ANGIOGRAPHY N/A 03/07/2020   Procedure: RIGHT/LEFT HEART CATH AND CORONARY ANGIOGRAPHY;  Surgeon: Sherren Mocha, MD;  Location: [facility];  Service: Cardiovascular;  Laterality: N/A;  . TEE WITHOUT CARDIOVERSION N/A 03/13/2020   Procedure: TRANSESOPHAGEAL ECHOCARDIOGRAM (TEE);  Surgeon: [name], MD;  Location: [facility];  Service: Open Heart Surgery;  Laterality: N/A;     HPI  from the history and physical done on the day of admission:    Chief Complaint: Leg pain and swelling  HPI: Shane Chambers is a 60 y.o. male with medical history significant for systolic and diastolic CHF, CABG x 5, COPD, diabetes mellitus, hypertension, COVID-19 infection. Patient presented to the ED with complaints of leg swelling and  pain with draining wounds ongoing for several weeks.  Patient reports wound started small swellings that opened up draining pus. Patient was in the ED 3 times this past December - 12/6, 12/16 and 11/05/20 patient has been treated with courses of antibiotics including doxycycline and Bactrim. Patient does not check his feet frequently, and reports when he checks his weight is unreliable.  On his last weight check a few days ago was 289, weight here in the ED is 281.  He reports his legs are currently the most swollen they have ever been.  He denies increasing abdominal gait.  No difficulty breathing.  No cough.  ED Course: Temperature 97.8.  Heart rate 87-  96, respiratory rate 18, blood pressure systolic 440N to 027O.  O2 sats greater than 97% on room air.  WBC 12.  Unremarkable BMP.  Portable chest x-ray without acute abnormality, right foot x-ray without evidence of osteomyelitis.  Subsequent MRI foot without contrast-findings consistent with edema or cellulitis.  No abscess, no soft tissue air, no suggestion of osteomyelitis. IV vancomycin given.  Hospitalist to admit for further evaluation and management.  Review of Systems: As per HPI all other systems reviewed and negative.    Hospital Course:   Brief Summary:- 60 y.o.malewith medical history significant forsystolic and diastolic CHF,CABG x 5,COPD, diabetes mellitus, hypertension, prior COVID-19 infection (11/2019) admitted on 11/23/2019 with bilateral lower extremity cellulitis  A/p  1) sepsis secondary to bilateral lower extremity cellulitis without osteomyelitis---  please see photos in epic -Patient met sepsis criteria on admission with leukocytosis, tachycardia and tachypnea in the setting of cellulitis  -Previously failed oral antibiotics as outpatient  -Leukocytosis noted (patient appears to have some degree of chronic leukocytosis) -Treated with Rocephin  Stopped IV vancomycin on 11/26/2020 and transitioned to p.o. doxycycline  on 11/27/2020-- (patient had MRSA positive PCR screening back in May 2021 and again in September 2021) -Okay to discharge on p.o. Omnicef and p.o. doxycycline for another week  2)DM2-A1c is 11.1 reflecting uncontrolled DM with hyperglycemia PTA -Continue 70/30 insulin at 55 units twice daily along with sliding scale coverage  3)HTN--stable, continue metoprolol 12.5 mg twice daily and losartan 12.5 mg daily  4)BPH--stable, continue Flomax  5)COPD--- no acute exacerbation at this time, continue Dulera  6) CAD-- s/p Prior CABG--stable, continue aspirin and Lipitor, as well as metoprolol  7)HFpEF--- patient with chronic diastolic dysfunction CHF, Echo with EF of 55 to 60%, and grade 2 diastolic dysfunction -Lower extremity edema noted, some dyspnea on exertion noted,  -Treated with IV Lasix, discharged on p.o. torsemide  8)Generalized weakness and ambulatory dysfunction--- PT eval appreciated, -recommends  SNF rehab  9) constipation-- improved after multiple doses of Lactulose and MiraLAX as ordered -Discharged on MiraLAX twice daily and Senokot-S nightly  10)Decubitus Ulcers/Right medial hindfoot with eschar--wound care specialist recommends twice daily cleansing of the area with soap and water, rinsing and patting dry, followed by application of a betadine swabstick and for allowing it to air-dry. Once dry, a dry dressing can be placed to protect the eschar from traumatic removal. The foot/feet are to be placed into a pressure redistribution heel boot. -Continue wound care  Disposition--discharge to SNF    Disposition: The patient is from: Home  Anticipated d/c is to: SNF        Code Status :  -  Code Status: Full Code   Family Communication:    NA (patient is alert, awake and coherent)  Consults  :  na  Discharge Condition: stable  Follow UP   Contact information for after-discharge care    [facility] .   Service: Skilled Nursing Contact information: [address] [place] [phone]                  Diet and Activity recommendation:  As advised  Discharge Instructions    Discharge Instructions    Call MD for:  difficulty breathing, headache or visual disturbances   Complete by: As directed    Call MD for:  persistant dizziness or light-headedness   Complete by: As directed    Call MD for:  persistant nausea and vomiting   Complete by: As directed    Call MD for:  severe uncontrolled pain   Complete by: As directed    Call MD for:  temperature >100.4   Complete by: As directed    Diet - low sodium heart healthy   Complete by: As directed    Diet Carb Modified   Complete by: As directed    Discharge instructions   Complete by: As directed    1)Avoid ibuprofen/Advil/Aleve/Motrin/Goody Powders/Naproxen/BC powders/Meloxicam/Diclofenac/Indomethacin and other Nonsteroidal anti-inflammatory medications as these will make you more likely to bleed and can cause stomach ulcers, can also cause Kidney problems.   2)---Sliding scale Insulin Coverage---:insulin aspart (novoLOG) injection 0- 15 Units 0-15 Units Subcutaneous, 3 times daily with meals CBG < 70: Implement Hypoglycemia Standing Orders and refer to Hypoglycemia Standing Orders sidebar report  CBG 70 - 120: 0 unit CBG 121 - 150: 0 unit  CBG 151 - 200: 4 unit CBG 201 - 250:  7units CBG 251 - 300:  9 units CBG 301 - 350:  11 units  CBG 351 - 400:  13units  CBG > 400: 15 units  3)Very low-salt diet advised 4)Weigh yourself daily, call if you gain more than 3 pounds in 1 day or more than 5 pounds in 1 week as your diuretic medications may need to be adjusted 5)Limit your Fluid  intake to no more than 60 ounces (1.8 Liters) per day 6)Wound care to right medial foot Unstageable pressure injury:  Cleanse with soap and water, rinse and pat dry. Paint eschar with betadine swabstick and allow to air dry.  When dry, top with dry gauze 4x4s and secure with a few turns of Kerlix roll gauze/paper tape. Place foot into Prevalon boot   Discharge wound care:   Complete by: As directed    - Wound care to right medial foot Unstageable pressure injury:  Cleanse with soap and water, rinse and pat dry. Paint  eschar with betadine swabstick and allow to air dry. When dry, top with dry gauze 4x4s and secure with a few turns of Kerlix roll gauze/paper tape. Place foot into Prevalon boot   Increase activity slowly   Complete by: As directed         Discharge Medications     Allergies as of 11/29/2020      Reactions   Ace Inhibitors Swelling, Cough   Other Itching   [name]   Penicillins    Has patient had a PCN reaction causing immediate rash, facial/tongue/throat swelling, SOB or lightheadedness with hypotension: Unknown Has patient had a PCN reaction causing severe rash involving mucus membranes or skin necrosis: Unknown Has patient had a PCN reaction that required hospitalization: Unknown Has patient had a PCN reaction occurring within the last 10 years: Unknown If all of the above answers are "NO", then may proceed with Cephalosporin use.      Medication List    STOP taking these medications   ALKA-SELTZER PLS ALLERGY & CGH PO   ciprofloxacin 500 MG tablet Commonly known as: CIPRO   docusate sodium 100 MG capsule Commonly known as: COLACE   fluticasone 27.5 MCG/SPRAY nasal spray Commonly known as: VERAMYST   guaiFENesin-dextromethorphan 100-10 MG/5ML syrup Commonly known as: ROBITUSSIN DM   insulin aspart 100 UNIT/ML injection Commonly known as: novoLOG Replaced by: insulin aspart 100 UNIT/ML FlexPen   ketoconazole 2 % cream Commonly known as: NIZORAL   mupirocin cream 2 % Commonly known as: BACTROBAN   traMADol 50 MG tablet Commonly known as: ULTRAM     TAKE these medications   acetaminophen 325 MG tablet Commonly known as: TYLENOL Take 2 tablets (650 mg total) by  mouth every 6 (six) hours as needed for mild pain (or Fever >/= 101).   albuterol 108 (90 Base) MCG/ACT inhaler Commonly known as: VENTOLIN HFA Inhale 1-2 puffs into the lungs every 6 (six) hours as needed for wheezing or shortness of breath.   aspirin 81 MG EC tablet Take 1 tablet (81 mg total) by mouth daily with breakfast. What changed: when to take this   atorvastatin 80 MG tablet Commonly known as: LIPITOR Take 80 mg by mouth daily.   budesonide-formoterol 160-4.5 MCG/ACT inhaler Commonly known as: Symbicort Inhale 2 puffs into the lungs in the morning and at bedtime.   calcium carbonate 1250 (500 Ca) MG tablet Commonly known as: OS-CAL - dosed in mg of elemental calcium Take 1 tablet by mouth daily.   cefdinir 300 MG capsule Commonly known as: OMNICEF Take 1 capsule (300 mg total) by mouth 2 (two) times daily for 7 days.   cholecalciferol 25 MCG (1000 UNIT) tablet Commonly known as: VITAMIN D3 Take 1,000 Units by mouth daily.   clopidogrel 75 MG tablet Commonly known as: PLAVIX Take 1 tablet (75 mg total) by mouth daily.   doxycycline 100 MG tablet Commonly known as: VIBRA-TABS Take 1 tablet (100 mg total) by mouth 2 (two) times daily for 7 days.   ferrous sulfate 325 (65 FE) MG tablet Take 1 tablet (325 mg total) by mouth every other day.   gabapentin 300 MG capsule Commonly known as: NEURONTIN Take 900 mg by mouth 2 (two) times daily.   insulin aspart 100 UNIT/ML FlexPen Commonly known as: NOVOLOG Inject 0-15 Units into the skin 3 (three) times daily with meals. insulin aspart (novoLOG) injection 0- 15 Units 0-15 Units Subcutaneous, 3 times daily with meals CBG < 70: Implement Hypoglycemia Standing Orders  and refer to Hypoglycemia Standing Orders sidebar report  CBG 70 - 120: 0 unit CBG 121 - 150: 0 unit  CBG 151 - 200: 4 unit CBG 201 - 250:  7units CBG 251 - 300:  9 units CBG 301 - 350:  11 units  CBG 351 - 400:  13units  CBG > 400: 15 units Replaces: insulin  aspart 100 UNIT/ML injection   insulin aspart protamine- aspart (70-30) 100 UNIT/ML injection Commonly known as: NovoLOG Mix 70/30 Inject 0.55 mLs (55 Units total) into the skin 2 (two) times daily with a meal.   losartan 25 MG tablet Commonly known as: COZAAR Take 0.5 tablets (12.5 mg total) by mouth daily.   magnesium oxide 400 MG tablet Commonly known as: MAG-OX Take 400 mg by mouth daily.   metFORMIN 1000 MG tablet Commonly known as: GLUCOPHAGE Take 1,000 mg by mouth 2 (two) times daily with a meal.   metoprolol succinate 25 MG 24 hr tablet Commonly known as: TOPROL-XL Take 0.5 tablets (12.5 mg total) by mouth 2 (two) times daily.   omeprazole 40 MG capsule Commonly known as: PRILOSEC Take 40 mg by mouth daily.   oxyCODONE 5 MG immediate release tablet Commonly known as: Oxy IR/ROXICODONE Take 1 tablet (5 mg total) by mouth every 12 (twelve) hours as needed for moderate pain.   polyethylene glycol 17 g packet Commonly known as: MIRALAX / GLYCOLAX Take 17 g by mouth 2 (two) times daily. What changed:   when to take this  reasons to take this   potassium chloride SA 20 MEQ tablet Commonly known as: KLOR-CON Take 1 tablet (20 mEq total) by mouth daily.   senna-docusate 8.6-50 MG tablet Commonly known as: Senokot-S Take 2 tablets by mouth at bedtime.   Tab-A-Vite Tabs Take 1 tablet by mouth daily.   tamsulosin 0.4 MG Caps capsule Commonly known as: FLOMAX Take 0.4 mg by mouth daily.   torsemide 20 MG tablet Commonly known as: DEMADEX Take 2 tablets (40 mg total) by mouth daily. May take an extra pill for 3 lbs weight gain in 24 hours or 5 lbs in one week What changed: when to take this   triamcinolone 0.1 % Commonly known as: KENALOG Apply 1 application topically 3 (three) times daily.   venlafaxine XR 150 MG 24 hr capsule Commonly known as: EFFEXOR-XR Take 150 mg by mouth daily with breakfast.   zinc gluconate 50 MG tablet Take 50 mg by mouth  daily.            Discharge Care Instructions  (From admission, onward)         Start     Ordered   11/29/20 0000  Discharge wound care:       Comments: - Wound care to right medial foot Unstageable pressure injury:  Cleanse with soap and water, rinse and pat dry. Paint eschar with betadine swabstick and allow to air dry. When dry, top with dry gauze 4x4s and secure with a few turns of Kerlix roll gauze/paper tape. Place foot into Prevalon boot   11/29/20 1532         Major procedures and Radiology Reports - PLEASE review detailed and final reports for all details, in brief -    MR FOOT RIGHT WO CONTRAST  Result Date: 11/22/2020 CLINICAL DATA:  Limping. Acute foot pain. Infection suspected. Assess for cellulitis. Nonhealing diabetic wound to the heel. EXAM: MRI OF THE RIGHT FOREFOOT WITHOUT CONTRAST TECHNIQUE: Multiplanar, multisequence MR imaging of the  right was performed. No intravenous contrast was administered. COMPARISON:  None. FINDINGS: Bones/Joint/Cartilage There is no bone marrow signal abnormality. Specifically, no evidence of osteomyelitis or edema. No evidence of a fracture. No bone lesions. Ankle joint is normally spaced and aligned. No cartilage defect. No joint effusion. Subtalar and midfoot joints are also normally spaced and aligned with no visualized cartilage defect or effusion. Ligaments Medial and lateral ligament complexes are intact. Ankle synchondrosis is intact. Plantar fascia is normal in thickness and signal. Muscles and Tendons Achilles tendon, posterior tibialis tendon and peroneal tendons as well as the remaining ankle tendons are normal in signal and thickness. There is diffuse increased signal within the intrinsic foot musculature consistent with edema and/or cellulitis. Soft tissues Diffuse soft tissue high T2 signal extending from distal leg, crossing the ankle and into the foot, predominating in the foot over the dorsal aspect. This is consistent with  edema, cellulitis or a combination. No focal soft tissue fluid collection to suggest an abscess. No convincing soft tissue air. IMPRESSION: 1. Abnormal increased T2 signal of the soft tissues of the distal leg, ankle and visualized foot, in the foot predominantly over the dorsum. This is consistent with edema or cellulitis. Without the benefit contrast, the presence and degree of cellulitis cannot be accurately assessed. 2. No evidence of an abscess.  No evidence of soft tissue air. 3. No bone marrow signal abnormality to suggest osteomyelitis. 4. No joint abnormality.  Intact ligaments and tendons. Electronically Signed   By: Lajean Manes M.D.   On: 11/22/2020 18:54   US Venous Img Lower Bilateral (DVT)  Result Date: 11/23/2020 CLINICAL DATA:  Bilateral lower extremity swelling and cellulitis for 2 months EXAM: BILATERAL LOWER EXTREMITY VENOUS DOPPLER ULTRASOUND TECHNIQUE: Gray-scale sonography with compression, as well as color and duplex ultrasound, were performed to evaluate the deep venous system(s) from the level of the common femoral vein through the popliteal and proximal calf veins. COMPARISON:  None. FINDINGS: VENOUS Normal compressibility of the common femoral, superficial femoral, and popliteal veins, as well as the visualized calf veins. Visualized portions of profunda femoral vein and great saphenous vein unremarkable. No filling defects to suggest DVT on grayscale or color Doppler imaging. Doppler waveforms show normal direction of venous flow, normal respiratory plasticity and response to augmentation. OTHER None. Limitations: Limited visualization of the calf veins due to edema. IMPRESSION: No lower extremity DVT Electronically Signed   By: Miachel Roux M.D.   On: 11/23/2020 10:33   DG Chest Portable 1 View  Result Date: 11/22/2020 CLINICAL DATA:  60 year old male with edema. EXAM: PORTABLE CHEST 1 VIEW COMPARISON:  Chest radiograph dated 10/25/2020. FINDINGS: There is mild eventration of  the right hemidiaphragm. Left lateral pleural thickening and left mid to lower lung field atelectasis/scarring similar to prior radiograph. No new consolidation. No pleural effusion pneumothorax. Stable cardiomegaly. Median sternotomy wires and CABG vascular clips. No acute osseous pathology. IMPRESSION: 1. No acute cardiopulmonary process. 2. Stable cardiomegaly. Electronically Signed   By: Anner Crete M.D.   On: 11/22/2020 16:34   DG Foot Complete Right  Result Date: 11/22/2020 CLINICAL DATA:  Cellulitis and swelling both legs/right foot wound at heel/diabetic EXAM: RIGHT FOOT COMPLETE - 3+ VIEW COMPARISON:  None. FINDINGS: No fracture or bone lesion. No bone resorption to suggest osteomyelitis. Joints are normally spaced and aligned. There are dorsal and plantar calcaneal spurs. Diffuse soft tissue swelling most evident over the dorsal forefoot. There are arterial vascular calcifications. No soft tissue air. IMPRESSION:  1. No fracture or dislocation. 2. No evidence of osteomyelitis. 3. Soft tissue swelling without evidence of soft tissue air. Electronically Signed   By: Lajean Manes M.D.   On: 11/22/2020 14:14   DG Foot Complete Right  Result Date: 11/05/2020 CLINICAL DATA:  Pain, blister. EXAM: RIGHT FOOT COMPLETE - 3+ VIEW COMPARISON:  None. FINDINGS: There is no evidence of fracture or dislocation. Slight hammertoe deformity of the digits. No erosion or bony destruction. Degenerative change in the mid and hindfoot most prominent at the talonavicular joint. Plantar calcaneal spur and Achilles tendon enthesophyte. Generalized soft tissue edema, prominent over the dorsum of the foot. There is also a rounded soft tissue density projecting over the plantar aspect of the calcaneus, indeterminate. No soft tissue air. No radiopaque foreign body. There are vascular calcifications. IMPRESSION: 1. Generalized soft tissue edema. No acute osseous abnormality. 2. Rounded soft tissue density projecting over  the plantar aspect of the calcaneus, indeterminate, may represent site of reported blister. Recommend correlation with physical exam. 3. Degenerative change in the mid and hindfoot. Plantar calcaneal spur and Achilles tendon enthesophyte. Electronically Signed   By: Keith Rake M.D.   On: 11/05/2020 21:10    Micro Results   Recent Results (from the past 240 hour(s))  Resp Panel by RT-PCR (Flu A&B, Covid) Nasopharyngeal Swab     Status: None   Collection Time: 11/22/20  3:15 PM   Specimen: Nasopharyngeal Swab; Nasopharyngeal(NP) swabs in vial transport medium  Result Value Ref Range Status   SARS Coronavirus 2 by RT PCR NEGATIVE NEGATIVE Final    Comment: (NOTE) SARS-CoV-2 target nucleic acids are NOT DETECTED.  The SARS-CoV-2 RNA is generally detectable in upper respiratory specimens during the acute phase of infection. The lowest concentration of SARS-CoV-2 viral copies this assay can detect is 138 copies/mL. A negative result does not preclude SARS-Cov-2 infection and should not be used as the sole basis for treatment or other patient management decisions. A negative result may occur with  improper specimen collection/handling, submission of specimen other than nasopharyngeal swab, presence of viral mutation(s) within the areas targeted by this assay, and inadequate number of viral copies(<138 copies/mL). A negative result must be combined with clinical observations, patient history, and epidemiological information. The expected result is Negative.  Fact Sheet for Patients:  EntrepreneurPulse.com.au  Fact Sheet for Healthcare Providers:  IncredibleEmployment.be  This test is no t yet approved or cleared by the [place] FDA and  has been authorized for detection and/or diagnosis of SARS-CoV-2 by FDA under an Emergency Use Authorization (EUA). This EUA will remain  in effect (meaning this test can be used) for the duration of  the COVID-19 declaration under Section 564(b)(1) of the Act, 21 U.S.C.section 360bbb-3(b)(1), unless the authorization is terminated  or revoked sooner.       Influenza A by PCR NEGATIVE NEGATIVE Final   Influenza B by PCR NEGATIVE NEGATIVE Final    Comment: (NOTE) The Xpert Xpress SARS-CoV-2/FLU/RSV plus assay is intended as an aid in the diagnosis of influenza from Nasopharyngeal swab specimens and should not be used as a sole basis for treatment. Nasal washings and aspirates are unacceptable for Xpert Xpress SARS-CoV-2/FLU/RSV testing.  Fact Sheet for Patients: EntrepreneurPulse.com.au  Fact Sheet for Healthcare Providers: IncredibleEmployment.be  This test is not yet approved or cleared by the [place] FDA and has been authorized for detection and/or diagnosis of SARS-CoV-2 by FDA under an Emergency Use Authorization (EUA). This EUA will remain in effect (meaning  this test can be used) for the duration of the COVID-19 declaration under Section 564(b)(1) of the Act, 21 U.S.C. section 360bbb-3(b)(1), unless the authorization is terminated or revoked.  Performed at [facility], [address] [place], [place] [address]   SARS Coronavirus 2 by RT PCR (hospital order, performed in [facility] hospital lab) Nasopharyngeal Nasopharyngeal Swab     Status: None   Collection Time: 11/29/20 12:12 PM   Specimen: Nasopharyngeal Swab  Result Value Ref Range Status   SARS Coronavirus 2 NEGATIVE NEGATIVE Final    Comment: (NOTE) SARS-CoV-2 target nucleic acids are NOT DETECTED.  The SARS-CoV-2 RNA is generally detectable in upper and lower respiratory specimens during the acute phase of infection. The lowest concentration of SARS-CoV-2 viral copies this assay can detect is 250 copies / mL. A negative result does not preclude SARS-CoV-2 infection and should not be used as the sole basis for treatment or other patient management decisions.  A  negative result may occur with improper specimen collection / handling, submission of specimen other than nasopharyngeal swab, presence of viral mutation(s) within the areas targeted by this assay, and inadequate number of viral copies (<250 copies / mL). A negative result must be combined with clinical observations, patient history, and epidemiological information.  Fact Sheet for Patients:   StrictlyIdeas.no  Fact Sheet for Healthcare Providers: BankingDealers.co.za  This test is not yet approved or  cleared by the [place] FDA and has been authorized for detection and/or diagnosis of SARS-CoV-2 by FDA under an Emergency Use Authorization (EUA).  This EUA will remain in effect (meaning this test can be used) for the duration of the COVID-19 declaration under Section 564(b)(1) of the Act, 21 U.S.C. section 360bbb-3(b)(1), unless the authorization is terminated or revoked sooner.  Performed at [facility], [address], [place], [place] [address]        Today   Subjective    Gamal Todisco today has no new complaints -Had BM          Patient has been seen and examined prior to discharge   Objective   Blood pressure 134/61, pulse 76, temperature 98.7 F (37.1 C), temperature source Oral, resp. rate 18, height 5' 9"  (1.753 m), weight (!) 136.3 kg, SpO2 100 %.   Intake/Output Summary (Last 24 hours) at 11/29/2020 1533 Last data filed at 11/29/2020 1106 Gross per 24 hour  Intake 240 ml  Output 1825 ml  Net -1585 ml    Exam Gen:- Awake Alert,  Obese, no apparent distress  HEENT:- [place].AT, No sclera icterus Neck-Supple Neck,No JVD,.  Lungs-diminished in bases, no wheezing  CV- S1, S2 normal, regular  Abd-  +ve B.Sounds, Abd Soft, No tenderness,    Extremity/Skin:-  pedal pulses present , edema and cellulitis/Right medial hindfoot with eschar--- please see photos in epic  Psych-affect is appropriate, oriented  x3 Neuro-generalized weakness, no new focal deficits, no tremors   Data Review   CBC w Diff:  Lab Results  Component Value Date   WBC 14.5 (H) 11/29/2020   HGB 10.2 (L) 11/29/2020   HCT 33.5 (L) 11/29/2020   PLT 362 11/29/2020   LYMPHOPCT 16 11/22/2020   MONOPCT 9 11/22/2020   EOSPCT 5 11/22/2020   BASOPCT 1 11/22/2020    CMP:  Lab Results  Component Value Date   NA 136 11/27/2020   K 4.2 11/27/2020   CL 99 11/27/2020   CO2 27 11/27/2020   BUN 34 (H) 11/27/2020   CREATININE 1.01  11/27/2020   PROT 8.2 (H) 10/15/2020   ALBUMIN 4.0 10/15/2020   BILITOT 0.7 10/15/2020   ALKPHOS 128 (H) 10/15/2020   AST 24 10/15/2020   ALT 21 10/15/2020  .   Total Discharge time is about 33 minutes  Roxan Hockey M.D on 11/29/2020 at 3:33 PM  Go to [email or web address] -  for contact info  [facility] - Office  [phone]

## 2020-11-29 NOTE — Plan of Care (Signed)

## 2020-11-29 NOTE — Discharge Instructions (Signed)
1)Avoid ibuprofen/Advil/Aleve/Motrin/Goody Powders/Naproxen/BC powders/Meloxicam/Diclofenac/Indomethacin and other Nonsteroidal anti-inflammatory medications as these will make you more likely to bleed and can cause stomach ulcers, can also cause Kidney problems.   2)---Sliding scale Insulin Coverage---:insulin aspart (novoLOG) injection 0- 15 Units 0-15 Units Subcutaneous, 3 times daily with meals CBG < 70: Implement Hypoglycemia Standing Orders and refer to Hypoglycemia Standing Orders sidebar report  CBG 70 - 120: 0 unit CBG 121 - 150: 0 unit  CBG 151 - 200: 4 unit CBG 201 - 250:  7units CBG 251 - 300:  9 units CBG 301 - 350:  11 units  CBG 351 - 400:  13units  CBG > 400: 15 units  3)Very low-salt diet advised 4)Weigh yourself daily, call if you gain more than 3 pounds in 1 day or more than 5 pounds in 1 week as your diuretic medications may need to be adjusted 5)Limit your Fluid  intake to no more than 60 ounces (1.8 Liters) per day 6)Wound care to right medial foot Unstageable pressure injury:  Cleanse with soap and water, rinse and pat dry. Paint eschar with betadine swabstick and allow to air dry. When dry, top with dry gauze 4x4s and secure with a few turns of Kerlix roll gauze/paper tape. Place foot into Prevalon boot

## 2020-11-29 NOTE — Progress Notes (Signed)
Attempted to call report to [facility] to [phone]. I was on hold for 15 minutes no one answered at this time.

## 2020-11-29 NOTE — TOC Transition Note (Signed)
Transition of Care [facility]) - CM/SW Discharge Note   Patient Details  Name: Shane Chambers MRN: [id number] Date of Birth: 30-Sep-1961  [facility] CM/SW Contact:  Shade Flood, LCSW Phone Number: 11/29/2020, 2:24 PM   Clinical Narrative:     Pt stable for dc. Spoke with Loma Boston at [facility] and they will not have a bed until tomorrow. Spoke with Bryson Ha at [facility] and they can take pt today. Spoke with pt and he is agreeable to BCY. DC clinical will be sent electronically. RN to call report. EMS arranged. There are no other TOC needs for dc.  Final next level of care: Skilled Nursing Facility Barriers to Discharge: Barriers Resolved   Patient Goals and CMS Choice Patient states their goals for this hospitalization and ongoing recovery are:: Rehab with SNF CMS Medicare.gov Compare Post Acute Care list provided to:: Patient Choice offered to / list presented to : Patient  Discharge Placement              Patient chooses bed at: [facility] Patient to be transferred to facility by: EMS Name of family member notified: Only patient Patient and family notified of of transfer: 11/29/20  Discharge Plan and Services In-house Referral: NA Discharge Planning Services: NA Post Acute Care Choice: NA          DME Arranged: N/A DME Agency: NA       HH Arranged: NA HH Agency: NA        Social Determinants of Health (SDOH) Interventions     Readmission Risk Interventions Readmission Risk Prevention Plan 11/25/2020 11/23/2020 08/02/2020  Transportation Screening Complete Complete Complete  PCP or Specialist Appt within 3-5 Days - - -  HRI or [place] Work Consult for [place] - - -  Medication Review Press photographer) Complete - Complete  HRI or Home Care Consult Complete - Complete  SW Recovery Care/Counseling Consult Complete Complete Complete   Palliative Care Screening Not Applicable Not Applicable Not Applicable  Skilled Nursing Facility Complete Patient Refused Not Applicable  Some recent data might be hidden

## 2020-11-30 LAB — GLUCOSE, CAPILLARY
Glucose-Capillary: 152 mg/dL — ABNORMAL HIGH (ref 70–99)
Glucose-Capillary: 109 mg/dL — ABNORMAL HIGH (ref 70–99)

## 2020-12-14 NOTE — Telephone Encounter (Signed)
Pt has already rescheduled for 2.15.22. Will close encounter.

## 2020-12-25 ENCOUNTER — Ambulatory Visit: Payer: Medicaid Other | Admitting: Pulmonary Disease

## 2021-02-21 ENCOUNTER — Encounter: Payer: Self-pay | Admitting: Cardiology

## 2021-02-21 ENCOUNTER — Other Ambulatory Visit: Payer: Self-pay

## 2021-02-21 ENCOUNTER — Ambulatory Visit (INDEPENDENT_AMBULATORY_CARE_PROVIDER_SITE_OTHER): Payer: Medicaid Other | Admitting: Cardiology

## 2021-02-21 VITALS — BP 126/70 | HR 72 | Ht 69.0 in | Wt 279.0 lb

## 2021-02-21 DIAGNOSIS — I2510 Atherosclerotic heart disease of native coronary artery without angina pectoris: Secondary | ICD-10-CM

## 2021-02-21 DIAGNOSIS — I5032 Chronic diastolic (congestive) heart failure: Secondary | ICD-10-CM | POA: Diagnosis not present

## 2021-02-21 DIAGNOSIS — E782 Mixed hyperlipidemia: Secondary | ICD-10-CM | POA: Diagnosis not present

## 2021-02-21 NOTE — Patient Instructions (Signed)
Your physician recommends that you schedule a follow-up appointment in: [place] physician has recommended you make the following change in your medication:   STOP PLAVIX ON MAY 1ST   Thank you for choosing [facility]!!

## 2021-02-21 NOTE — Progress Notes (Signed)
Clinical Summary [name] is a 60 y.o.male seen today for follow up of the following medical problems.   1. CAD - s/p CABG in 03/2020 with LIMA-LAD, RIMA-PL, RA-D1-RI-OM1 - his CABG was in the setting of NSTEMI, committed to 1 year DAPT  - no recent chest pain. CHronic SOB unchanged - compliant with meds   2. Chronic diastolic HF - -0/2409 Echo: LVEF 60-65%, mod LVH, grade II DDx, normal RV function - 07/2020 limited echo LVEF 55-60%, grade II DDx (unchanged) - some improvement in LE edema - weights at nursing home 279, slow trending down.    3 Hyperlipidemia - compliant with meds   4. Cellulitis - admission Jan 2022 with sepsis, cellulitis Past Medical History:  Diagnosis Date  . CAD (coronary artery disease)    a. s/p CABG in 03/2020 with LIMA-LAD, RIMA-PL, RA-D1-RI-OM1  . Cellulitis   . Depression   . Diabetes mellitus without complication ([place])   . Hyperlipidemia 12/01/2019  . Hypertension   . Ischemic cardiomyopathy    a. EF 20-25% by echo in 02/2020 b. at 40% by echo in 03/2020 c. EF normalized to 60-65% by echo in 04/2020  . Type 2 diabetes mellitus (HCC)      Allergies  Allergen Reactions  . Ace Inhibitors Swelling and Cough  . Other Itching    [name]  . Penicillins     Has patient had a PCN reaction causing immediate rash, facial/tongue/throat swelling, SOB or lightheadedness with hypotension: Unknown Has patient had a PCN reaction causing severe rash involving mucus membranes or skin necrosis: Unknown Has patient had a PCN reaction that required hospitalization: Unknown Has patient had a PCN reaction occurring within the last 10 years: Unknown If all of the above answers are "NO", then may proceed with Cephalosporin use.      Current Outpatient Medications  Medication Sig Dispense Refill  . acetaminophen (TYLENOL) 325 MG tablet Take 2 tablets (650 mg total) by mouth every 6 (six) hours as needed for mild pain (or Fever >/= 101). 30 tablet  4  . albuterol (VENTOLIN HFA) 108 (90 Base) MCG/ACT inhaler Inhale 1-2 puffs into the lungs every 6 (six) hours as needed for wheezing or shortness of breath. 18 g 3  . aspirin 81 MG EC tablet Take 1 tablet (81 mg total) by mouth daily with breakfast. 30 tablet 12  . atorvastatin (LIPITOR) 80 MG tablet Take 80 mg by mouth daily.    . budesonide-formoterol (SYMBICORT) 160-4.5 MCG/ACT inhaler Inhale 2 puffs into the lungs in the morning and at bedtime. 1 Inhaler 6  . calcium carbonate (OS-CAL - DOSED IN MG OF ELEMENTAL CALCIUM) 1250 (500 Ca) MG tablet Take 1 tablet by mouth daily.    . cholecalciferol (VITAMIN D3) 25 MCG (1000 UNIT) tablet Take 1,000 Units by mouth daily.    . clopidogrel (PLAVIX) 75 MG tablet Take 1 tablet (75 mg total) by mouth daily. 90 tablet 1  . ferrous sulfate 325 (65 FE) MG tablet Take 1 tablet (325 mg total) by mouth every other day. 30 tablet 3  . gabapentin (NEURONTIN) 300 MG capsule Take 900 mg by mouth 2 (two) times daily.    . insulin aspart (NOVOLOG) 100 UNIT/ML FlexPen Inject 0-15 Units into the skin 3 (three) times daily with meals. insulin aspart (novoLOG) injection 0- 15 Units 0-15 Units Subcutaneous, 3 times daily with meals CBG < 70: Implement Hypoglycemia Standing Orders and refer to Hypoglycemia Standing Orders sidebar report  CBG 70 - 120: 0 unit CBG 121 - 150: 0 unit  CBG 151 - 200: 4 unit CBG 201 - 250:  7units CBG 251 - 300:  9 units CBG 301 - 350:  11 units  CBG 351 - 400:  13units  CBG > 400: 15 units 15 mL 11  . insulin aspart protamine- aspart (NOVOLOG MIX 70/30) (70-30) 100 UNIT/ML injection Inject 0.55 mLs (55 Units total) into the skin 2 (two) times daily with a meal. 10 mL 11  . losartan (COZAAR) 25 MG tablet Take 0.5 tablets (12.5 mg total) by mouth daily. 45 tablet 1  . magnesium oxide (MAG-OX) 400 MG tablet Take 400 mg by mouth daily.    . metFORMIN (GLUCOPHAGE) 1000 MG tablet Take 1,000 mg by mouth 2 (two) times daily with a meal.    . metoprolol  succinate (TOPROL-XL) 25 MG 24 hr tablet Take 0.5 tablets (12.5 mg total) by mouth 2 (two) times daily. 90 tablet 1  . Multiple Vitamins-Minerals (TAB-A-VITE) TABS Take 1 tablet by mouth daily.    Marland Kitchen omeprazole (PRILOSEC) 40 MG capsule Take 40 mg by mouth daily.    Marland Kitchen oxyCODONE (OXY IR/ROXICODONE) 5 MG immediate release tablet Take 1 tablet (5 mg total) by mouth every 12 (twelve) hours as needed for moderate pain. 12 tablet 0  . polyethylene glycol (MIRALAX / GLYCOLAX) 17 g packet Take 17 g by mouth 2 (two) times daily. 60 each 1  . potassium chloride SA (KLOR-CON) 20 MEQ tablet Take 1 tablet (20 mEq total) by mouth daily. 90 tablet 1  . senna-docusate (SENOKOT-S) 8.6-50 MG tablet Take 2 tablets by mouth at bedtime. 60 tablet 1  . tamsulosin (FLOMAX) 0.4 MG CAPS capsule Take 0.4 mg by mouth daily.    Marland Kitchen torsemide (DEMADEX) 20 MG tablet Take 2 tablets (40 mg total) by mouth daily. May take an extra pill for 3 lbs weight gain in 24 hours or 5 lbs in one week 60 tablet 2  . triamcinolone (KENALOG) 0.1 % Apply 1 application topically 3 (three) times daily. (Patient not taking: No sig reported) 45 g 0  . venlafaxine XR (EFFEXOR-XR) 150 MG 24 hr capsule Take 150 mg by mouth daily with breakfast.    . zinc gluconate 50 MG tablet Take 50 mg by mouth daily.     No current facility-administered medications for this visit.     Past Surgical History:  Procedure Laterality Date  . CORONARY ARTERY BYPASS GRAFT N/A 03/13/2020   Procedure: CORONARY ARTERY BYPASS GRAFTING (CABG) times five using bilateral Internal mammary arteries and left radial artery;  Surgeon: [name], MD;  Location: [place];  Service: Open Heart Surgery;  Laterality: N/A;  . DENTAL SURGERY    . RADIAL ARTERY HARVEST Left 03/13/2020   Procedure: RADIAL ARTERY HARVEST,;  Surgeon: [name], MD;  Location: [place];  Service: Open Heart Surgery;  Laterality: Left;  . RIGHT/LEFT HEART CATH AND CORONARY ANGIOGRAPHY N/A 03/07/2020    Procedure: RIGHT/LEFT HEART CATH AND CORONARY ANGIOGRAPHY;  Surgeon: Sherren Mocha, MD;  Location: [facility];  Service: Cardiovascular;  Laterality: N/A;  . TEE WITHOUT CARDIOVERSION N/A 03/13/2020   Procedure: TRANSESOPHAGEAL ECHOCARDIOGRAM (TEE);  Surgeon: [name], MD;  Location: [place];  Service: Open Heart Surgery;  Laterality: N/A;     Allergies  Allergen Reactions  . Ace Inhibitors Swelling and Cough  . Other Itching    [name]  . Penicillins  Has patient had a PCN reaction causing immediate rash, facial/tongue/throat swelling, SOB or lightheadedness with hypotension: Unknown Has patient had a PCN reaction causing severe rash involving mucus membranes or skin necrosis: Unknown Has patient had a PCN reaction that required hospitalization: Unknown Has patient had a PCN reaction occurring within the last 10 years: Unknown If all of the above answers are "NO", then may proceed with Cephalosporin use.       Family History  Problem Relation Age of Onset  . Hypertension Mother      Social History [name] reports that he quit smoking about 25 years ago. His smoking use included cigarettes. He smoked 1.50 packs per day. He has never used smokeless tobacco. Mr. Kaser reports current alcohol use.   Review of Systems CONSTITUTIONAL: No weight loss, fever, chills, weakness or fatigue.  HEENT: Eyes: No visual loss, blurred vision, double vision or yellow sclerae.No hearing loss, sneezing, congestion, runny nose or sore throat.  SKIN: No rash or itching.  CARDIOVASCULAR: per hpi RESPIRATORY: No shortness of breath, cough or sputum.  GASTROINTESTINAL: No anorexia, nausea, vomiting or diarrhea. No abdominal pain or blood.  GENITOURINARY: No burning on urination, no polyuria NEUROLOGICAL: No headache, dizziness, syncope, paralysis, ataxia, numbness or tingling in the extremities. No change in bowel or bladder control.  MUSCULOSKELETAL: No muscle, back pain,  joint pain or stiffness.  LYMPHATICS: No enlarged nodes. No history of splenectomy.  PSYCHIATRIC: No history of depression or anxiety.  ENDOCRINOLOGIC: No reports of sweating, cold or heat intolerance. No polyuria or polydipsia.  Marland Kitchen   Physical Examination Today's Vitals   02/21/21 1125  BP: 126/70  Pulse: 72  SpO2: 98%  Weight: 279 lb (126.6 kg)  Height: 5\' 9"  (1.753 m)   Body mass index is 41.2 kg/m.  Gen: resting comfortably, no acute distress HEENT: no scleral icterus, pupils equal round and reactive, no palptable cervical adenopathy,  CV: RRR, n om/r/g no jvd Resp: Clear to auscultation bilaterally GI: abdomen is soft, non-tender, non-distended, normal bowel sounds, no hepatosplenomegaly MSK: extremities are warm, no edema.  Skin: warm, no rash Neuro:  no focal deficits Psych: appropriate affect   Diagnostic Studies     Assessment and Plan  1. CAD - no recent symptosm - can stop plavix May 1st, continue other meds  2. Chronic diastolic HF - some ongoing edema, weights slowly trending down on torsemide. Continue current dose, if renal function stable over time could consider titration  3. Hyperlipidemia - continue statin, request labs from [facility]     Arnoldo Lenis, M.D

## 2021-02-22 ENCOUNTER — Encounter: Payer: Self-pay | Admitting: *Deleted

## 2021-06-24 ENCOUNTER — Ambulatory Visit: Payer: Medicaid Other | Admitting: Cardiology

## 2021-06-24 NOTE — Progress Notes (Deleted)
Clinical Summary Mr. Seraphin is a 60 y.o.male seen today for follow up of the following medical problems.    1. CAD - s/p CABG in 03/2020 with LIMA-LAD, RIMA-PL, RA-D1-RI-OM1 - his CABG was in the setting of NSTEMI, committed to 1 year DAPT   - no recent chest pain. CHronic SOB unchanged - compliant with meds     2. Chronic diastolic HF -  - [id number] Echo: LVEF 60-65%, mod LVH, grade II DDx, normal RV function - 07/2020 limited echo LVEF 55-60%, grade II DDx (unchanged) - some improvement in LE edema - weights at nursing home 279, slow trending down.      3 Hyperlipidemia - compliant with meds     4. Cellulitis - admission Jan 2022 with sepsis, cellulitis   Past Medical History:  Diagnosis Date   CAD (coronary artery disease)    a. s/p CABG in 03/2020 with LIMA-LAD, RIMA-PL, RA-D1-RI-OM1   Cellulitis    Depression    Diabetes mellitus without complication ([place])    Hyperlipidemia 12/01/2019   Hypertension    Ischemic cardiomyopathy    a. EF 20-25% by echo in 02/2020 b. at 40% by echo in 03/2020 c. EF normalized to 60-65% by echo in 04/2020   Type 2 diabetes mellitus (HCC)      Allergies  Allergen Reactions   Ace Inhibitors Swelling and Cough   Other Itching    [name]   Penicillins     Has patient had a PCN reaction causing immediate rash, facial/tongue/throat swelling, SOB or lightheadedness with hypotension: Unknown Has patient had a PCN reaction causing severe rash involving mucus membranes or skin necrosis: Unknown Has patient had a PCN reaction that required hospitalization: Unknown Has patient had a PCN reaction occurring within the last 10 years: Unknown If all of the above answers are "NO", then may proceed with Cephalosporin use.      Current Outpatient Medications  Medication Sig Dispense Refill   acetaminophen (TYLENOL) 325 MG tablet Take 2 tablets (650 mg total) by mouth every 6 (six) hours as needed for mild pain (or Fever >/= 101). 30 tablet  4   albuterol (VENTOLIN HFA) 108 (90 Base) MCG/ACT inhaler Inhale 1-2 puffs into the lungs every 6 (six) hours as needed for wheezing or shortness of breath. 18 g 3   aspirin 81 MG EC tablet Take 1 tablet (81 mg total) by mouth daily with breakfast. 30 tablet 12   atorvastatin (LIPITOR) 80 MG tablet Take 80 mg by mouth daily.     budesonide-formoterol (SYMBICORT) 160-4.5 MCG/ACT inhaler Inhale 2 puffs into the lungs in the morning and at bedtime. 1 Inhaler 6   cholecalciferol (VITAMIN D3) 25 MCG (1000 UNIT) tablet Take 1,000 Units by mouth daily.     clopidogrel (PLAVIX) 75 MG tablet Take 1 tablet (75 mg total) by mouth daily. (Patient taking differently: Take 75 mg by mouth daily. STOP ON 03/10/2021) 90 tablet 1   ferrous sulfate 325 (65 FE) MG tablet Take 1 tablet (325 mg total) by mouth every other day. 30 tablet 3   gabapentin (NEURONTIN) 300 MG capsule Take 900 mg by mouth 2 (two) times daily.     insulin aspart (NOVOLOG) 100 UNIT/ML FlexPen Inject 0-15 Units into the skin 3 (three) times daily with meals. insulin aspart (novoLOG) injection 0- 15 Units 0-15 Units Subcutaneous, 3 times daily with meals CBG < 70: Implement Hypoglycemia Standing Orders and refer to Hypoglycemia Standing Orders sidebar report  CBG  70 - 120: 0 unit CBG 121 - 150: 0 unit  CBG 151 - 200: 4 unit CBG 201 - 250:  7units CBG 251 - 300:  9 units CBG 301 - 350:  11 units  CBG 351 - 400:  13units  CBG > 400: 15 units 15 mL 11   insulin aspart protamine- aspart (NOVOLOG MIX 70/30) (70-30) 100 UNIT/ML injection Inject 0.55 mLs (55 Units total) into the skin 2 (two) times daily with a meal. 10 mL 11   levothyroxine (SYNTHROID) 25 MCG tablet Take 25 mcg by mouth daily before breakfast.     losartan (COZAAR) 25 MG tablet Take 0.5 tablets (12.5 mg total) by mouth daily. 45 tablet 1   magnesium oxide (MAG-OX) 400 MG tablet Take 400 mg by mouth daily.     metFORMIN (GLUCOPHAGE) 1000 MG tablet Take 1,000 mg by mouth 2 (two) times daily  with a meal.     metoprolol succinate (TOPROL-XL) 25 MG 24 hr tablet Take 0.5 tablets (12.5 mg total) by mouth 2 (two) times daily. 90 tablet 1   Multiple Vitamins-Minerals (TAB-A-VITE) TABS Take 1 tablet by mouth daily.     omeprazole (PRILOSEC) 40 MG capsule Take 40 mg by mouth daily.     oxyCODONE (OXY IR/ROXICODONE) 5 MG immediate release tablet Take 1 tablet (5 mg total) by mouth every 12 (twelve) hours as needed for moderate pain. (Patient taking differently: Take 5 mg by mouth every 8 (eight) hours as needed for moderate pain.) 12 tablet 0   potassium chloride SA (KLOR-CON) 20 MEQ tablet Take 1 tablet (20 mEq total) by mouth daily. 90 tablet 1   senna-docusate (SENOKOT-S) 8.6-50 MG tablet Take 2 tablets by mouth at bedtime. (Patient taking differently: Take 2 tablets by mouth at bedtime as needed.) 60 tablet 1   tamsulosin (FLOMAX) 0.4 MG CAPS capsule Take 0.4 mg by mouth daily.     torsemide (DEMADEX) 20 MG tablet Take 40 mg by mouth daily. & 20 mg daily as needed for 3 lb weight gain in 24 hours or 5 lbs in one week     venlafaxine XR (EFFEXOR-XR) 150 MG 24 hr capsule Take 150 mg by mouth daily with breakfast.     zinc gluconate 50 MG tablet Take 50 mg by mouth daily.     No current facility-administered medications for this visit.     Past Surgical History:  Procedure Laterality Date   CORONARY ARTERY BYPASS GRAFT N/A 03/13/2020   Procedure: CORONARY ARTERY BYPASS GRAFTING (CABG) times five using bilateral Internal mammary arteries and left radial artery;  Surgeon: [name], MD;  Location: [place];  Service: Open Heart Surgery;  Laterality: N/A;   DENTAL SURGERY     RADIAL ARTERY HARVEST Left 03/13/2020   Procedure: [place],;  Surgeon: [name], MD;  Location: [place];  Service: Open Heart Surgery;  Laterality: Left;   RIGHT/LEFT HEART CATH AND CORONARY ANGIOGRAPHY N/A 03/07/2020   Procedure: RIGHT/LEFT HEART CATH AND CORONARY ANGIOGRAPHY;  Surgeon: Sherren Mocha, MD;  Location: [place] [facility];  Service: Cardiovascular;  Laterality: N/A;   TEE WITHOUT CARDIOVERSION N/A 03/13/2020   Procedure: TRANSESOPHAGEAL ECHOCARDIOGRAM (TEE);  Surgeon: [name], MD;  Location: [place];  Service: Open Heart Surgery;  Laterality: N/A;     Allergies  Allergen Reactions   Ace Inhibitors Swelling and Cough   Other Itching    [name]   Penicillins     Has patient  had a PCN reaction causing immediate rash, facial/tongue/throat swelling, SOB or lightheadedness with hypotension: Unknown Has patient had a PCN reaction causing severe rash involving mucus membranes or skin necrosis: Unknown Has patient had a PCN reaction that required hospitalization: Unknown Has patient had a PCN reaction occurring within the last 10 years: Unknown If all of the above answers are "NO", then may proceed with Cephalosporin use.       Family History  Problem Relation Age of Onset   Hypertension Mother      Social History Mr. Swatek reports that he quit smoking about 26 years ago. His smoking use included cigarettes. He smoked an average of 1.5 packs per day. He has never used smokeless tobacco. Mr. Seckel reports current alcohol use.   Review of Systems CONSTITUTIONAL: No weight loss, fever, chills, weakness or fatigue.  HEENT: Eyes: No visual loss, blurred vision, double vision or yellow sclerae.No hearing loss, sneezing, congestion, runny nose or sore throat.  SKIN: No rash or itching.  CARDIOVASCULAR:  RESPIRATORY: No shortness of breath, cough or sputum.  GASTROINTESTINAL: No anorexia, nausea, vomiting or diarrhea. No abdominal pain or blood.  GENITOURINARY: No burning on urination, no polyuria NEUROLOGICAL: No headache, dizziness, syncope, paralysis, ataxia, numbness or tingling in the extremities. No change in bowel or bladder control.  MUSCULOSKELETAL: No muscle, back pain, joint pain or stiffness.  LYMPHATICS: No enlarged nodes. No history of  splenectomy.  PSYCHIATRIC: No history of depression or anxiety.  ENDOCRINOLOGIC: No reports of sweating, cold or heat intolerance. No polyuria or polydipsia.  Marland Kitchen   Physical Examination There were no vitals filed for this visit. There were no vitals filed for this visit.  Gen: resting comfortably, no acute distress HEENT: no scleral icterus, pupils equal round and reactive, no palptable cervical adenopathy,  CV Resp: Clear to auscultation bilaterally GI: abdomen is soft, non-tender, non-distended, normal bowel sounds, no hepatosplenomegaly MSK: extremities are warm, no edema.  Skin: warm, no rash Neuro:  no focal deficits Psych: appropriate affect   Diagnostic Studies     Assessment and Plan  1. CAD - no recent symptosm - can stop plavix May 1st, continue other meds   2. Chronic diastolic HF - some ongoing edema, weights slowly trending down on torsemide. Continue current dose, if renal function stable over time could consider titration   3. Hyperlipidemia - continue statin, request labs from [facility]      Arnoldo Lenis, M.D.

## 2021-07-03 ENCOUNTER — Other Ambulatory Visit: Payer: Self-pay

## 2021-07-03 ENCOUNTER — Ambulatory Visit (INDEPENDENT_AMBULATORY_CARE_PROVIDER_SITE_OTHER): Payer: Medicare Other | Admitting: Internal Medicine

## 2021-07-03 ENCOUNTER — Telehealth: Payer: Self-pay | Admitting: *Deleted

## 2021-07-03 VITALS — BP 103/64 | HR 81 | Temp 97.0°F | Ht 69.0 in | Wt 279.0 lb

## 2021-07-03 DIAGNOSIS — K219 Gastro-esophageal reflux disease without esophagitis: Secondary | ICD-10-CM

## 2021-07-03 DIAGNOSIS — Z1211 Encounter for screening for malignant neoplasm of colon: Secondary | ICD-10-CM | POA: Diagnosis not present

## 2021-07-03 DIAGNOSIS — R1319 Other dysphagia: Secondary | ICD-10-CM | POA: Diagnosis not present

## 2021-07-03 NOTE — Telephone Encounter (Signed)
[facility] in [place] and [place] for transport to call back to schedule TCS/EGD/DIL with propofol asa 3 Dr Abbey Chatters.

## 2021-07-03 NOTE — H&P (View-Only) (Signed)
Primary Care Physician:  Bonnita Nasuti, MD Primary Gastroenterologist:  Dr. Abbey Chatters  Chief Complaint  Patient presents with   Colonoscopy    Over due for colonoscopy, history of IBS and GERD     HPI:   Shane Chambers is a 60 y.o. male who presents to the clinic today by referral from the [facility] for evaluation.  Rush [facility]mark states he is due for screening colonoscopy.  Last colonoscopy 2007 which he states was unremarkable besides hemorrhoids.  No family history of colorectal malignancy.  No unintentional weight loss.  No melena hematochezia.  No abdominal pain.  Does have chronic GERD which is relatively well controlled omeprazole 40 mg daily.  He states as long as he takes his medication it "helps" his symptoms.  Does note new onset of dysphagia primarily with pills.  Tolerates food and liquids without issue.  No history of peptic ulcer disease or H. pylori.  Last EGD in 2007 though I do not have access to this report.  Patient does have a history of CAD status post CABG May 2021.  Was on dual antiplatelet therapy for a year though is now only on baby aspirin.  Past Medical History:  Diagnosis Date   CAD (coronary artery disease)    a. s/p CABG in 03/2020 with LIMA-LAD, RIMA-PL, RA-D1-RI-OM1   Cellulitis    Depression    Diabetes mellitus without complication ([place])    Hyperlipidemia 12/01/2019   Hypertension    Ischemic cardiomyopathy    a. EF 20-25% by echo in 02/2020 b. at 40% by echo in 03/2020 c. EF normalized to 60-65% by echo in 04/2020   Type 2 diabetes mellitus ([place])     Past Surgical History:  Procedure Laterality Date   CORONARY ARTERY BYPASS GRAFT N/A 03/13/2020   Procedure: CORONARY ARTERY BYPASS GRAFTING (CABG) times five using bilateral Internal mammary arteries and left radial artery;  Surgeon: [name], MD;  Location: [place];  Service: Open Heart Surgery;  Laterality: N/A;   DENTAL SURGERY     RADIAL ARTERY HARVEST Left 03/13/2020   Procedure: [place],;  Surgeon: [name], MD;  Location: [place];  Service: Open Heart Surgery;  Laterality: Left;   RIGHT/LEFT HEART CATH AND CORONARY ANGIOGRAPHY N/A 03/07/2020   Procedure: RIGHT/LEFT HEART CATH AND CORONARY ANGIOGRAPHY;  Surgeon: Sherren Mocha, MD;  Location: [place] [facility];  Service: Cardiovascular;  Laterality: N/A;   TEE WITHOUT CARDIOVERSION N/A 03/13/2020   Procedure: TRANSESOPHAGEAL ECHOCARDIOGRAM (TEE);  Surgeon: [name], MD;  Location: [place];  Service: Open Heart Surgery;  Laterality: N/A;    Current Outpatient Medications  Medication Sig Dispense Refill   acetaminophen (TYLENOL) 325 MG tablet Take 2 tablets (650 mg total) by mouth every 6 (six) hours as needed for mild pain (or Fever >/= 101). 30 tablet 4   albuterol (VENTOLIN HFA) 108 (90 Base) MCG/ACT inhaler Inhale 1-2 puffs into the lungs every 6 (six) hours as needed for wheezing or shortness of breath. 18 g 3   amitriptyline (ELAVIL) 25 MG tablet Take 50 mg by mouth at bedtime.     Ascorbic Acid (VITA-C PO) Take by mouth daily.     aspirin 81 MG EC tablet Take 1 tablet (81 mg total) by mouth daily with breakfast. 30 tablet 12   atorvastatin (LIPITOR) 80 MG tablet Take 80 mg by mouth daily.     budesonide-formoterol (SYMBICORT) 160-4.5 MCG/ACT inhaler Inhale 2 puffs into the  lungs in the morning and at bedtime. 1 Inhaler 6   calcium-vitamin D (OSCAL WITH D) 500-200 MG-UNIT tablet Take 1 tablet by mouth daily with breakfast.     cholecalciferol (VITAMIN D3) 25 MCG (1000 UNIT) tablet Take 2,000 Units by mouth daily.     ferrous sulfate 325 (65 FE) MG tablet Take 1 tablet (325 mg total) by mouth every other day. 30 tablet 3   gabapentin (NEURONTIN) 300 MG capsule Take 900 mg by mouth 2 (two) times daily.     insulin aspart (NOVOLOG) 100 UNIT/ML FlexPen Inject 0-15 Units into the skin 3 (three) times daily with meals. insulin aspart (novoLOG) injection 0- 15 Units 0-15 Units Subcutaneous, 3 times daily  with meals CBG < 70: Implement Hypoglycemia Standing Orders and refer to Hypoglycemia Standing Orders sidebar report  CBG 70 - 120: 0 unit CBG 121 - 150: 0 unit  CBG 151 - 200: 4 unit CBG 201 - 250:  7units CBG 251 - 300:  9 units CBG 301 - 350:  11 units  CBG 351 - 400:  13units  CBG > 400: 15 units 15 mL 11   insulin aspart protamine- aspart (NOVOLOG MIX 70/30) (70-30) 100 UNIT/ML injection Inject 0.55 mLs (55 Units total) into the skin 2 (two) times daily with a meal. (Patient taking differently: Inject 15 Units into the skin 2 (two) times daily with a meal.) 10 mL 11   levothyroxine (SYNTHROID) 25 MCG tablet Take 75 mcg by mouth daily before breakfast.     loperamide HCl (IMODIUM) 2 MG/15ML solution Take by mouth as needed for diarrhea or loose stools. 63m as needed     losartan (COZAAR) 25 MG tablet Take 0.5 tablets (12.5 mg total) by mouth daily. (Patient taking differently: Take 50 mg by mouth daily.) 45 tablet 1   magnesium oxide (MAG-OX) 400 MG tablet Take 400 mg by mouth daily.     metFORMIN (GLUCOPHAGE) 1000 MG tablet Take 1,000 mg by mouth 2 (two) times daily with a meal.     metoprolol succinate (TOPROL-XL) 25 MG 24 hr tablet Take 0.5 tablets (12.5 mg total) by mouth 2 (two) times daily. 90 tablet 1   Multiple Vitamins-Minerals (TAB-A-VITE) TABS Take 1 tablet by mouth daily.     omeprazole (PRILOSEC) 40 MG capsule Take 40 mg by mouth daily.     polyethylene glycol (MIRALAX / GLYCOLAX) 17 g packet Take 17 g by mouth daily as needed.     potassium chloride SA (KLOR-CON) 20 MEQ tablet Take 1 tablet (20 mEq total) by mouth daily. 90 tablet 1   senna (SENOKOT) 8.6 MG TABS tablet Take 2 tablets by mouth daily as needed for mild constipation.     tamsulosin (FLOMAX) 0.4 MG CAPS capsule Take 0.4 mg by mouth daily.     torsemide (DEMADEX) 20 MG tablet Take 40 mg by mouth daily. & 20 mg daily as needed for 3 lb weight gain in 24 hours or 5 lbs in one week     traMADol (ULTRAM) 50 MG tablet Take 50  mg by mouth every 6 (six) hours as needed.     venlafaxine XR (EFFEXOR-XR) 150 MG 24 hr capsule Take 150 mg by mouth daily with breakfast.     zinc gluconate 50 MG tablet Take 50 mg by mouth daily.     No current facility-administered medications for this visit.    Allergies as of 07/03/2021 - Review Complete 07/03/2021  Allergen Reaction Noted   Ace inhibitors  Swelling and Cough 12/11/2017   Other Itching 11/15/2019   Penicillins  12/11/2017    Family History  Problem Relation Age of Onset   Hypertension Mother     Social History   Socioeconomic History   Marital status: Single    Spouse name: Not on file   Number of children: Not on file   Years of education: Not on file   Highest education level: Not on file  Occupational History   Not on file  Tobacco Use   Smoking status: Former    Packs/day: 1.50    Types: Cigarettes    Quit date: 05/25/1995    Years since quitting: 26.1   Smokeless tobacco: Never  Vaping Use   Vaping Use: Never used  Substance and Sexual Activity   Alcohol use: Yes    Comment: rarely   Drug use: No   Sexual activity: Not on file  Other Topics Concern   Not on file  Social History Narrative   Not on file   Social Determinants of Health   Financial Resource Strain: Not on file  Food Insecurity: Not on file  Transportation Needs: Not on file  Physical Activity: Not on file  Stress: Not on file  Social Connections: Not on file  Intimate Partner Violence: Not on file    Subjective: Review of Systems  Constitutional:  Negative for chills and fever.  HENT:  Negative for congestion and hearing loss.   Eyes:  Negative for blurred vision and double vision.  Respiratory:  Negative for cough and shortness of breath.   Cardiovascular:  Negative for chest pain and palpitations.  Gastrointestinal:  Positive for heartburn. Negative for abdominal pain, blood in stool, constipation, diarrhea, melena and vomiting.  Genitourinary:  Negative for  dysuria and urgency.  Musculoskeletal:  Negative for joint pain and myalgias.  Skin:  Negative for itching and rash.  Neurological:  Negative for dizziness and headaches.  Psychiatric/Behavioral:  Negative for depression. The patient is not nervous/anxious.       Objective: BP 103/64   Pulse 81   Temp (!) 97 F (36.1 C) (Temporal)   Ht '5\' 9"'$  (1.753 m)   Wt 279 lb (126.6 kg)   BMI 41.20 kg/m  Physical Exam Constitutional:      Appearance: Normal appearance.     Comments: In wheelchair  HENT:     Head: Normocephalic and atraumatic.  Eyes:     Extraocular Movements: Extraocular movements intact.     Conjunctiva/sclera: Conjunctivae normal.  Cardiovascular:     Rate and Rhythm: Normal rate and regular rhythm.  Pulmonary:     Effort: Pulmonary effort is normal.     Breath sounds: Normal breath sounds.  Abdominal:     General: Bowel sounds are normal.     Palpations: Abdomen is soft.  Musculoskeletal:        General: Normal range of motion.     Cervical back: Normal range of motion and neck supple.     Right lower leg: Edema present.     Left lower leg: Edema present.  Skin:    General: Skin is warm.  Neurological:     General: No focal deficit present.     Mental Status: He is alert and oriented to person, place, and time.  Psychiatric:        Mood and Affect: Mood normal.        Behavior: Behavior normal.     Assessment: *GERD-relatively well controlled on Omeprazole *Esophageal dysphagia-new *  Colon cancer screening  Plan: GERD relatively well controlled on omeprazole.  However he is now describing dysphagia primarily with pills.    Will schedule for EGD with possible dilation to evaluate for peptic ulcer disease, esophagitis, gastritis, H. Pylori, duodenitis, or other. Will also evaluate for esophageal stricture, Schatzki's ring, esophageal web or other.   At the same time we will perform colonoscopy for colon cancer screening purposes.  The risks including  infection, bleed, or perforation as well as benefits, limitations, alternatives and imponderables have been reviewed with the patient. Potential for esophageal dilation, biopsy, etc. have also been reviewed.  Questions have been answered. All parties agreeable.  Continue on omeprazole daily.  Further recommendations to follow.  07/03/2021 10:25 AM   Disclaimer: This note was dictated with voice recognition software. Similar sounding words can inadvertently be transcribed and may not be corrected upon review.

## 2021-07-03 NOTE — Progress Notes (Signed)
Primary Care Physician:  Bonnita Nasuti, MD Primary Gastroenterologist:  Dr. Abbey Chatters  Chief Complaint  Patient presents with   Colonoscopy    Over due for colonoscopy, history of IBS and GERD     HPI:   Shane Chambers is a 60 y.o. male who presents to the clinic today by referral from the [facility] for evaluation.  Shane Chambers states he is due for screening colonoscopy.  Last colonoscopy 2007 which he states was unremarkable besides hemorrhoids.  No family history of colorectal malignancy.  No unintentional weight loss.  No melena hematochezia.  No abdominal pain.  Does have chronic GERD which is relatively well controlled omeprazole 40 mg daily.  He states as long as he takes his medication it "helps" his symptoms.  Does note new onset of dysphagia primarily with pills.  Tolerates food and liquids without issue.  No history of peptic ulcer disease or H. pylori.  Last EGD in 2007 though I do not have access to this report.  Patient does have a history of CAD status post CABG May 2021.  Was on dual antiplatelet therapy for a year though is now only on baby aspirin.  Past Medical History:  Diagnosis Date   CAD (coronary artery disease)    a. s/p CABG in 03/2020 with LIMA-LAD, RIMA-PL, RA-[id number]-OM1   Cellulitis    Depression    Diabetes mellitus without complication ([place])    Hyperlipidemia 12/01/2019   Hypertension    Ischemic cardiomyopathy    a. EF 20-25% by echo in 02/2020 b. at 40% by echo in 03/2020 c. EF normalized to 60-65% by echo in 04/2020   Type 2 diabetes mellitus ([place])     Past Surgical History:  Procedure Laterality Date   CORONARY ARTERY BYPASS GRAFT N/A 03/13/2020   Procedure: CORONARY ARTERY BYPASS GRAFTING (CABG) times five using bilateral Internal mammary arteries and left radial artery;  Surgeon: [name], MD;  Location: [place];  Service: Open Heart Surgery;  Laterality: N/A;   DENTAL SURGERY     RADIAL ARTERY HARVEST Left 03/13/2020   Procedure: [place],;  Surgeon: [name], MD;  Location: [place];  Service: Open Heart Surgery;  Laterality: Left;   RIGHT/LEFT HEART CATH AND CORONARY ANGIOGRAPHY N/A 03/07/2020   Procedure: RIGHT/LEFT HEART CATH AND CORONARY ANGIOGRAPHY;  Surgeon: Sherren Mocha, MD;  Location: [place] [facility];  Service: Cardiovascular;  Laterality: N/A;   TEE WITHOUT CARDIOVERSION N/A 03/13/2020   Procedure: TRANSESOPHAGEAL ECHOCARDIOGRAM (TEE);  Surgeon: [name], MD;  Location: [place];  Service: Open Heart Surgery;  Laterality: N/A;    Current Outpatient Medications  Medication Sig Dispense Refill   acetaminophen (TYLENOL) 325 MG tablet Take 2 tablets (650 mg total) by mouth every 6 (six) hours as needed for mild pain (or Fever >/= 101). 30 tablet 4   albuterol (VENTOLIN HFA) 108 (90 Base) MCG/ACT inhaler Inhale 1-2 puffs into the lungs every 6 (six) hours as needed for wheezing or shortness of breath. 18 g 3   amitriptyline (ELAVIL) 25 MG tablet Take 50 mg by mouth at bedtime.     Ascorbic Acid (VITA-C PO) Take by mouth daily.     aspirin 81 MG EC tablet Take 1 tablet (81 mg total) by mouth daily with breakfast. 30 tablet 12   atorvastatin (LIPITOR) 80 MG tablet Take 80 mg by mouth daily.     budesonide-formoterol (SYMBICORT) 160-4.5 MCG/ACT inhaler Inhale 2 puffs into the  lungs in the morning and at bedtime. 1 Inhaler 6   calcium-vitamin D (OSCAL WITH D) 500-200 MG-UNIT tablet Take 1 tablet by mouth daily with breakfast.     cholecalciferol (VITAMIN D3) 25 MCG (1000 UNIT) tablet Take 2,000 Units by mouth daily.     ferrous sulfate 325 (65 FE) MG tablet Take 1 tablet (325 mg total) by mouth every other day. 30 tablet 3   gabapentin (NEURONTIN) 300 MG capsule Take 900 mg by mouth 2 (two) times daily.     insulin aspart (NOVOLOG) 100 UNIT/ML FlexPen Inject 0-15 Units into the skin 3 (three) times daily with meals. insulin aspart (novoLOG) injection 0- 15 Units 0-15 Units Subcutaneous, 3 times daily  with meals CBG < 70: Implement Hypoglycemia Standing Orders and refer to Hypoglycemia Standing Orders sidebar report  CBG 70 - 120: 0 unit CBG 121 - 150: 0 unit  CBG 151 - 200: 4 unit CBG 201 - 250:  7units CBG 251 - 300:  9 units CBG 301 - 350:  11 units  CBG 351 - 400:  13units  CBG > 400: 15 units 15 mL 11   insulin aspart protamine- aspart (NOVOLOG MIX 70/30) (70-30) 100 UNIT/ML injection Inject 0.55 mLs (55 Units total) into the skin 2 (two) times daily with a meal. (Patient taking differently: Inject 15 Units into the skin 2 (two) times daily with a meal.) 10 mL 11   levothyroxine (SYNTHROID) 25 MCG tablet Take 75 mcg by mouth daily before breakfast.     loperamide HCl (IMODIUM) 2 MG/15ML solution Take by mouth as needed for diarrhea or loose stools. 79m as needed     losartan (COZAAR) 25 MG tablet Take 0.5 tablets (12.5 mg total) by mouth daily. (Patient taking differently: Take 50 mg by mouth daily.) 45 tablet 1   magnesium oxide (MAG-OX) 400 MG tablet Take 400 mg by mouth daily.     metFORMIN (GLUCOPHAGE) 1000 MG tablet Take 1,000 mg by mouth 2 (two) times daily with a meal.     metoprolol succinate (TOPROL-XL) 25 MG 24 hr tablet Take 0.5 tablets (12.5 mg total) by mouth 2 (two) times daily. 90 tablet 1   Multiple Vitamins-Minerals (TAB-A-VITE) TABS Take 1 tablet by mouth daily.     omeprazole (PRILOSEC) 40 MG capsule Take 40 mg by mouth daily.     polyethylene glycol (MIRALAX / GLYCOLAX) 17 g packet Take 17 g by mouth daily as needed.     potassium chloride SA (KLOR-CON) 20 MEQ tablet Take 1 tablet (20 mEq total) by mouth daily. 90 tablet 1   senna (SENOKOT) 8.6 MG TABS tablet Take 2 tablets by mouth daily as needed for mild constipation.     tamsulosin (FLOMAX) 0.4 MG CAPS capsule Take 0.4 mg by mouth daily.     torsemide (DEMADEX) 20 MG tablet Take 40 mg by mouth daily. & 20 mg daily as needed for 3 lb weight gain in 24 hours or 5 lbs in one week     traMADol (ULTRAM) 50 MG tablet Take 50  mg by mouth every 6 (six) hours as needed.     venlafaxine XR (EFFEXOR-XR) 150 MG 24 hr capsule Take 150 mg by mouth daily with breakfast.     zinc gluconate 50 MG tablet Take 50 mg by mouth daily.     No current facility-administered medications for this visit.    Allergies as of 07/03/2021 - Review Complete 07/03/2021  Allergen Reaction Noted   Ace inhibitors  Swelling and Cough 12/11/2017   Other Itching 11/15/2019   Penicillins  12/11/2017    Family History  Problem Relation Age of Onset   Hypertension Mother     Social History   Socioeconomic History   Marital status: Single    Spouse name: Not on file   Number of children: Not on file   Years of education: Not on file   Highest education level: Not on file  Occupational History   Not on file  Tobacco Use   Smoking status: Former    Packs/day: 1.50    Types: Cigarettes    Quit date: 05/25/1995    Years since quitting: 26.1   Smokeless tobacco: Never  Vaping Use   Vaping Use: Never used  Substance and Sexual Activity   Alcohol use: Yes    Comment: rarely   Drug use: No   Sexual activity: Not on file  Other Topics Concern   Not on file  Social History Narrative   Not on file   Social Determinants of Health   Financial Resource Strain: Not on file  Food Insecurity: Not on file  Transportation Needs: Not on file  Physical Activity: Not on file  Stress: Not on file  Social Connections: Not on file  Intimate Partner Violence: Not on file    Subjective: Review of Systems  Constitutional:  Negative for chills and fever.  HENT:  Negative for congestion and hearing loss.   Eyes:  Negative for blurred vision and double vision.  Respiratory:  Negative for cough and shortness of breath.   Cardiovascular:  Negative for chest pain and palpitations.  Gastrointestinal:  Positive for heartburn. Negative for abdominal pain, blood in stool, constipation, diarrhea, melena and vomiting.  Genitourinary:  Negative for  dysuria and urgency.  Musculoskeletal:  Negative for joint pain and myalgias.  Skin:  Negative for itching and rash.  Neurological:  Negative for dizziness and headaches.  Psychiatric/Behavioral:  Negative for depression. The patient is not nervous/anxious.       Objective: BP 103/64   Pulse 81   Temp (!) 97 F (36.1 C) (Temporal)   Ht '5\' 9"'$  (1.753 m)   Wt 279 lb (126.6 kg)   BMI 41.20 kg/m  Physical Exam Constitutional:      Appearance: Normal appearance.     Comments: In wheelchair  HENT:     Head: Normocephalic and atraumatic.  Eyes:     Extraocular Movements: Extraocular movements intact.     Conjunctiva/sclera: Conjunctivae normal.  Cardiovascular:     Rate and Rhythm: Normal rate and regular rhythm.  Pulmonary:     Effort: Pulmonary effort is normal.     Breath sounds: Normal breath sounds.  Abdominal:     General: Bowel sounds are normal.     Palpations: Abdomen is soft.  Musculoskeletal:        General: Normal range of motion.     Cervical back: Normal range of motion and neck supple.     Right lower leg: Edema present.     Left lower leg: Edema present.  Skin:    General: Skin is warm.  Neurological:     General: No focal deficit present.     Mental Status: He is alert and oriented to person, place, and time.  Psychiatric:        Mood and Affect: Mood normal.        Behavior: Behavior normal.     Assessment: *GERD-relatively well controlled on Omeprazole *Esophageal dysphagia-new *  Colon cancer screening  Plan: GERD relatively well controlled on omeprazole.  However he is now describing dysphagia primarily with pills.    Will schedule for EGD with possible dilation to evaluate for peptic ulcer disease, esophagitis, gastritis, H. Pylori, duodenitis, or other. Will also evaluate for esophageal stricture, Schatzki's ring, esophageal web or other.   At the same time we will perform colonoscopy for colon cancer screening purposes.  The risks including  infection, bleed, or perforation as well as benefits, limitations, alternatives and imponderables have been reviewed with the patient. Potential for esophageal dilation, biopsy, etc. have also been reviewed.  Questions have been answered. All parties agreeable.  Continue on omeprazole daily.  Further recommendations to follow.  07/03/2021 10:25 AM   Disclaimer: This note was dictated with voice recognition software. Similar sounding words can inadvertently be transcribed and may not be corrected upon review.

## 2021-07-03 NOTE — Patient Instructions (Signed)
We will schedule you for upper endoscopy to further evaluate your chronic GERD, screen for Barrett's esophagus, as well as evaluate your difficulty swallowing pills.  It's possible I will perform esophageal dilation to help with this.  At the same time we will perform colonoscopy for colon cancer screening purposes.  Continue on omeprazole for your chronic GERD.  Further recommendations to follow.  Is very nice meeting you today Shane Chambers.  Dr. Abbey Chatters  At [facility] we value your feedback. You may receive a survey about your visit today. Please share your experience as we strive to create trusting relationships with our patients to provide genuine, compassionate, quality care.  We appreciate your understanding and patience as we review any laboratory studies, imaging, and other diagnostic tests that are ordered as we care for you. Our office policy is 5 business days for review of these results, and any emergent or urgent results are addressed in a timely manner for your best interest. If you do not hear from our office in 1 week, please contact us.   We also encourage the use of MyChart, which contains your medical information for your review as well. If you are not enrolled in this feature, an access code is on this after visit summary for your convenience. Thank you for allowing [place] to be involved in your care.  It was great to see you today!  I hope you have a great rest of your summer!!    [name]. Abbey Chatters, D.O. Gastroenterology and Hepatology [facility]

## 2021-07-04 ENCOUNTER — Telehealth: Payer: Self-pay | Admitting: Internal Medicine

## 2021-07-04 NOTE — Telephone Encounter (Signed)
See prior note

## 2021-07-04 NOTE — Telephone Encounter (Signed)
[facility]. Line rang numerous times and no answer

## 2021-07-04 NOTE — Telephone Encounter (Signed)
Patient driver, Shane Chambers, called to schedule his procedure

## 2021-07-05 NOTE — Telephone Encounter (Signed)
[facility] to speak with Jodell Cipro to schedule procedure but not available

## 2021-07-09 MED ORDER — PEG 3350-KCL-NA BICARB-NACL 420 G PO SOLR: ORAL | 0 refills | Status: DC

## 2021-07-09 NOTE — Telephone Encounter (Signed)
[facility] and spoke with Claiborne Billings. Procedure scheduled for 9/8 at 8:00am. Aware will need pre-op appt and will fax with prep instructions. She provided fax# [phone]. Rx sent to pharmacy

## 2021-07-09 NOTE — Addendum Note (Signed)
Addended by: Cheron Every on: 07/09/2021 08:36 AM   Modules accepted: Orders

## 2021-07-11 NOTE — Patient Instructions (Signed)
Shane Chambers  07/11/2021     '@PREFPERIOPPHARMACY'$ @   Your procedure is scheduled on  07/18/2021.   Report to [name] at  [phone] A.M.   Call this number if you have problems the morning of surgery:  [phone]   Remember:  Follow the diet and prep instructions given to you by the office.    Your last dose of iron should have been 07/11/2021.    On 07/17/2021, the day of your prep, only take 1/2 of your metformin and 1/2 of your  usual insulin dosage each time you are scheduled to take these.    DO NOT take any medication for diabetes the morning of your procedure.       Use your inhalers the morning of your surgery and bring your rescue inhaler with you.      Take these medicines the morning of surgery with A SIP OF WATER         gabapentin, levothyroxine, metoprolol, flomax, tramadol(if needed), effexor.      Do not wear jewelry, make-up or nail polish.  Do not wear lotions, powders, or perfumes, or deodorant.  Do not shave 48 hours prior to surgery.  Men may shave face and neck.  Do not bring valuables to the hospital.  [name] is not responsible for any belongings or valuables.  Contacts, dentures or bridgework may not be worn into surgery.  Leave your suitcase in the car.  After surgery it may be brought to your room.  For patients admitted to the hospital, discharge time will be determined by your treatment team.  Patients discharged the day of surgery will not be allowed to drive home and must have someone with them for 24 hours.    Special instructions:   DO NOT smoke tobacco or vape for 24 hours before your procedure.  Please read over the following fact sheets that you were given. Anesthesia Post-op Instructions and Care and Recovery After Surgery      Upper Endoscopy, Adult, Care After This sheet gives you information about how to care for yourself after your procedure. Your health care provider may also give you more specific  instructions. If you have problems or questions, contact your health care provider. What can I expect after the procedure? After the procedure, it is common to have: A sore throat. Mild stomach pain or discomfort. Bloating. Nausea. Follow these instructions at home:  Follow instructions from your health care provider about what to eat or drink after your procedure. Return to your normal activities as told by your health care provider. Ask your health care provider what activities are safe for you. Take over-the-counter and prescription medicines only as told by your health care provider. If you were given a sedative during the procedure, it can affect you for several hours. Do not drive or operate machinery until your health care provider says that it is safe. Keep all follow-up visits as told by your health care provider. This is important. Contact a health care provider if you have: A sore throat that lasts longer than one day. Trouble swallowing. Get help right away if: You vomit blood or your vomit looks like coffee grounds. You have: A fever. Bloody, black, or tarry stools. A severe sore throat or you cannot swallow. Difficulty breathing. Severe pain in your chest or abdomen. Summary After the procedure, it is common to have a sore throat, mild stomach discomfort, bloating, and  nausea. If you were given a sedative during the procedure, it can affect you for several hours. Do not drive or operate machinery until your health care provider says that it is safe. Follow instructions from your health care provider about what to eat or drink after your procedure. Return to your normal activities as told by your health care provider. This information is not intended to replace advice given to you by your health care provider. Make sure you discuss any questions you have with your health care provider. Document Revised: 10/25/2019 Document Reviewed: 03/29/2018 Elsevier Patient Education   2022 [place]. Esophageal Dilatation Esophageal dilatation, also called esophageal dilation, is a procedure to widen or open a blocked or narrowed part of the esophagus. The esophagus is the part of the body that moves food and liquid from the mouth to the stomach. You may need this procedure if: You have a buildup of scar tissue in your esophagus that makes it difficult, painful, or impossible to swallow. This can be caused by gastroesophageal reflux disease (GERD). You have cancer of the esophagus. There is a problem with how food moves through your esophagus. In some cases, you may need this procedure repeated at a later time to dilate the esophagus gradually. Tell a health care provider about: Any allergies you have. All medicines you are taking, including vitamins, herbs, eye drops, creams, and over-the-counter medicines. Any problems you or family members have had with anesthetic medicines. Any blood disorders you have. Any surgeries you have had. Any medical conditions you have. Any antibiotic medicines you are required to take before dental procedures. Whether you are pregnant or may be pregnant. What are the risks? Generally, this is a safe procedure. However, problems may occur, including: Bleeding due to a tear in the lining of the esophagus. A hole, or perforation, in the esophagus. What happens before the procedure? Ask your health care provider about: Changing or stopping your regular medicines. This is especially important if you are taking diabetes medicines or blood thinners. Taking medicines such as aspirin and ibuprofen. These medicines can thin your blood. Do not take these medicines unless your health care provider tells you to take them. Taking over-the-counter medicines, vitamins, herbs, and supplements. Follow instructions from your health care provider about eating or drinking restrictions. Plan to have a responsible adult take you home from the hospital or  clinic. Plan to have a responsible adult care for you for the time you are told after you leave the hospital or clinic. This is important. What happens during the procedure? You may be given a medicine to help you relax (sedative). A numbing medicine may be sprayed into the back of your throat, or you may gargle the medicine. Your health care provider may perform the dilatation using various surgical instruments, such as: Simple dilators. This instrument is carefully placed in the esophagus to stretch it. Guided wire bougies. This involves using an endoscope to insert a wire into the esophagus. A dilator is passed over this wire to enlarge the esophagus. Then the wire is removed. Balloon dilators. An endoscope with a small balloon is inserted into the esophagus. The balloon is inflated to stretch the esophagus and open it up. The procedure may vary among health care providers and hospitals. What can I expect after the procedure? Your blood pressure, heart rate, breathing rate, and blood oxygen level will be monitored until you leave the hospital or clinic. Your throat may feel slightly sore and numb. This will get  better over time. You will not be allowed to eat or drink until your throat is no longer numb. When you are able to drink, urinate, and sit on the edge of the bed without nausea or dizziness, you may be able to return home. Follow these instructions at home: Take over-the-counter and prescription medicines only as told by your health care provider. If you were given a sedative during the procedure, it can affect you for several hours. Do not drive or operate machinery until your health care provider says that it is safe. Plan to have a responsible adult care for you for the time you are told. This is important. Follow instructions from your health care provider about any eating or drinking restrictions. Do not use any products that contain nicotine or tobacco, such as cigarettes,  e-cigarettes, and chewing tobacco. If you need help quitting, ask your health care provider. Keep all follow-up visits. This is important. Contact a health care provider if: You have a fever. You have pain that is not relieved by medicine. Get help right away if: You have chest pain. You have trouble breathing. You have trouble swallowing. You vomit blood. You have black, tarry, or bloody stools. These symptoms may represent a serious problem that is an emergency. Do not wait to see if the symptoms will go away. Get medical help right away. Call your local emergency services (911 in the [place] Do not drive yourself to the hospital. Summary Esophageal dilatation, also called esophageal dilation, is a procedure to widen or open a blocked or narrowed part of the esophagus. Plan to have a responsible adult take you home from the hospital or clinic. For this procedure, a numbing medicine may be sprayed into the back of your throat, or you may gargle the medicine. Do not drive or operate machinery until your health care provider says that it is safe. This information is not intended to replace advice given to you by your health care provider. Make sure you discuss any questions you have with your health care provider. Document Revised: 03/14/2020 Document Reviewed: 03/14/2020 Elsevier Patient Education  [place]. Colonoscopy, Adult, Care After This sheet gives you information about how to care for yourself after your procedure. Your health care provider may also give you more specific instructions. If you have problems or questions, contact your health care provider. What can I expect after the procedure? After the procedure, it is common to have: A small amount of blood in your stool for 24 hours after the procedure. Some gas. Mild cramping or bloating of your abdomen. Follow these instructions at home: Eating and drinking  Drink enough fluid to keep your urine pale yellow. Follow  instructions from your health care provider about eating or drinking restrictions. Resume your normal diet as instructed by your health care provider. Avoid heavy or fried foods that are hard to digest. Activity Rest as told by your health care provider. Avoid sitting for a long time without moving. Get up to take short walks every 1-2 hours. This is important to improve blood flow and breathing. Ask for help if you feel weak or unsteady. Return to your normal activities as told by your health care provider. Ask your health care provider what activities are safe for you. Managing cramping and bloating  Try walking around when you have cramps or feel bloated. Apply heat to your abdomen as told by your health care provider. Use the heat source that your health care provider recommends, such  as a moist heat pack or a heating pad. Place a towel between your skin and the heat source. Leave the heat on for 20-30 minutes. Remove the heat if your skin turns bright red. This is especially important if you are unable to feel pain, heat, or cold. You may have a greater risk of getting burned. General instructions If you were given a sedative during the procedure, it can affect you for several hours. Do not drive or operate machinery until your health care provider says that it is safe. For the first 24 hours after the procedure: Do not sign important documents. Do not drink alcohol. Do your regular daily activities at a slower pace than normal. Eat soft foods that are easy to digest. Take over-the-counter and prescription medicines only as told by your health care provider. Keep all follow-up visits as told by your health care provider. This is important. Contact a health care provider if: You have blood in your stool 2-3 days after the procedure. Get help right away if you have: More than a small spotting of blood in your stool. Large blood clots in your stool. Swelling of your abdomen. Nausea or  vomiting. A fever. Increasing pain in your abdomen that is not relieved with medicine. Summary After the procedure, it is common to have a small amount of blood in your stool. You may also have mild cramping and bloating of your abdomen. If you were given a sedative during the procedure, it can affect you for several hours. Do not drive or operate machinery until your health care provider says that it is safe. Get help right away if you have a lot of blood in your stool, nausea or vomiting, a fever, or increased pain in your abdomen. This information is not intended to replace advice given to you by your health care provider. Make sure you discuss any questions you have with your health care provider. Document Revised: 10/21/2019 Document Reviewed: 05/23/2019 Elsevier Patient Education  [place] After This sheet gives you information about how to care for yourself after your procedure. Your health care provider may also give you more specific instructions. If you have problems or questions, contact your health care provider. What can I expect after the procedure? After the procedure, it is common to have: Tiredness. Forgetfulness about what happened after the procedure. Impaired judgment for important decisions. Nausea or vomiting. Some difficulty with balance. Follow these instructions at home: For the time period you were told by your health care provider:   Rest as needed. Do not participate in activities where you could fall or become injured. Do not drive or use machinery. Do not drink alcohol. Do not take sleeping pills or medicines that cause drowsiness. Do not make important decisions or sign legal documents. Do not take care of children on your own. Eating and drinking Follow the diet that is recommended by your health care provider. Drink enough fluid to keep your urine pale yellow. If you vomit: Drink water, juice, or soup when  you can drink without vomiting. Make sure you have little or no nausea before eating solid foods. General instructions Have a responsible adult stay with you for the time you are told. It is important to have someone help care for you until you are awake and alert. Take over-the-counter and prescription medicines only as told by your health care provider. If you have sleep apnea, surgery and certain medicines can increase your risk for breathing  problems. Follow instructions from your health care provider about wearing your sleep device: Anytime you are sleeping, including during daytime naps. While taking prescription pain medicines, sleeping medicines, or medicines that make you drowsy. Avoid smoking. Keep all follow-up visits as told by your health care provider. This is important. Contact a health care provider if: You keep feeling nauseous or you keep vomiting. You feel light-headed. You are still sleepy or having trouble with balance after 24 hours. You develop a rash. You have a fever. You have redness or swelling around the IV site. Get help right away if: You have trouble breathing. You have new-onset confusion at home. Summary For several hours after your procedure, you may feel tired. You may also be forgetful and have poor judgment. Have a responsible adult stay with you for the time you are told. It is important to have someone help care for you until you are awake and alert. Rest as told. Do not drive or operate machinery. Do not drink alcohol or take sleeping pills. Get help right away if you have trouble breathing, or if you suddenly become confused. This information is not intended to replace advice given to you by your health care provider. Make sure you discuss any questions you have with your health care provider. Document Revised: 07/12/2020 Document Reviewed: 09/29/2019 Elsevier Patient Education  2022 [employer].

## 2021-07-16 ENCOUNTER — Other Ambulatory Visit: Payer: Self-pay

## 2021-07-16 ENCOUNTER — Encounter (HOSPITAL_COMMUNITY)
Admission: RE | Admit: 2021-07-16 | Discharge: 2021-07-16 | Disposition: A | Discharge status: Home / Self Care | Payer: Medicare Other | Source: Ambulatory Visit | Attending: Internal Medicine | Admitting: Internal Medicine

## 2021-07-16 ENCOUNTER — Encounter (HOSPITAL_COMMUNITY): Payer: Self-pay

## 2021-07-16 DIAGNOSIS — Z01812 Encounter for preprocedural laboratory examination: Secondary | ICD-10-CM | POA: Diagnosis present

## 2021-07-16 DIAGNOSIS — D649 Anemia, unspecified: Secondary | ICD-10-CM | POA: Insufficient documentation

## 2021-07-16 HISTORY — DX: Sleep apnea, unspecified: G47.30

## 2021-07-16 HISTORY — DX: Peripheral vascular disease, unspecified: I73.9

## 2021-07-16 LAB — BASIC METABOLIC PANEL
Anion gap: 11 (ref 5–15)
BUN: 22 mg/dL — ABNORMAL HIGH (ref 6–20)
CO2: 26 mmol/L (ref 22–32)
Calcium: 9.1 mg/dL (ref 8.9–10.3)
Chloride: 101 mmol/L (ref 98–111)
Creatinine, Ser: 0.96 mg/dL (ref 0.61–1.24)
GFR, Estimated: >60 mL/min (ref >60)
Glucose, Bld: 98 mg/dL (ref 70–99)
Potassium: 4 mmol/L (ref 3.5–5.1)
Sodium: 138 mmol/L (ref 135–145)

## 2021-07-16 LAB — CBC WITH DIFFERENTIAL/PLATELET
Abs Immature Granulocytes: 0.14 10*3/uL — ABNORMAL HIGH (ref 0.00–0.07)
Basophils Absolute: 0.1 10*3/uL (ref 0.0–0.1)
Basophils Relative: 1 %
Eosinophils Absolute: 0.7 10*3/uL — ABNORMAL HIGH (ref 0.0–0.5)
Eosinophils Relative: 5 %
HCT: 38.8 % — ABNORMAL LOW (ref 39.0–52.0)
Hemoglobin: 12.1 g/dL — ABNORMAL LOW (ref 13.0–17.0)
Immature Granulocytes: 1 %
Lymphocytes Relative: 12 %
Lymphs Abs: 1.6 10*3/uL (ref 0.7–4.0)
MCH: 27.9 pg (ref 26.0–34.0)
MCHC: 31.2 g/dL (ref 30.0–36.0)
MCV: 89.4 fL (ref 80.0–100.0)
Monocytes Absolute: 1 10*3/uL (ref 0.1–1.0)
Monocytes Relative: 8 %
Neutro Abs: 10 10*3/uL — ABNORMAL HIGH (ref 1.7–7.7)
Neutrophils Relative %: 73 %
Platelets: 329 10*3/uL (ref 150–400)
RBC: 4.34 MIL/uL (ref 4.22–5.81)
RDW: 13.8 % (ref 11.5–15.5)
WBC: 13.4 10*3/uL — ABNORMAL HIGH (ref 4.0–10.5)
nRBC: 0 % (ref 0.0–0.2)

## 2021-07-18 ENCOUNTER — Ambulatory Visit (HOSPITAL_COMMUNITY): Payer: Medicare Other | Admitting: Anesthesiology

## 2021-07-18 ENCOUNTER — Encounter (HOSPITAL_COMMUNITY)
Admission: EM | Disposition: A | Discharge status: Skilled Nursing Facility | Payer: Self-pay | Source: Ambulatory Visit | Attending: Internal Medicine

## 2021-07-18 ENCOUNTER — Ambulatory Visit: Payer: Medicare Other | Admitting: Family Medicine

## 2021-07-18 ENCOUNTER — Other Ambulatory Visit: Payer: Self-pay

## 2021-07-18 ENCOUNTER — Ambulatory Visit (HOSPITAL_COMMUNITY)
Admission: EM | Admit: 2021-07-18 | Discharge: 2021-07-18 | Disposition: A | Discharge status: Skilled Nursing Facility | Payer: Medicare Other | Source: Ambulatory Visit | Attending: Internal Medicine | Admitting: Internal Medicine

## 2021-07-18 ENCOUNTER — Encounter (HOSPITAL_COMMUNITY): Payer: Self-pay

## 2021-07-18 DIAGNOSIS — K219 Gastro-esophageal reflux disease without esophagitis: Secondary | ICD-10-CM | POA: Insufficient documentation

## 2021-07-18 DIAGNOSIS — Z79890 Hormone replacement therapy (postmenopausal): Secondary | ICD-10-CM | POA: Diagnosis not present

## 2021-07-18 DIAGNOSIS — K2970 Gastritis, unspecified, without bleeding: Secondary | ICD-10-CM | POA: Diagnosis not present

## 2021-07-18 DIAGNOSIS — Z79899 Other long term (current) drug therapy: Secondary | ICD-10-CM | POA: Diagnosis not present

## 2021-07-18 DIAGNOSIS — Z1211 Encounter for screening for malignant neoplasm of colon: Secondary | ICD-10-CM

## 2021-07-18 DIAGNOSIS — K317 Polyp of stomach and duodenum: Secondary | ICD-10-CM | POA: Diagnosis not present

## 2021-07-18 DIAGNOSIS — R1310 Dysphagia, unspecified: Secondary | ICD-10-CM | POA: Diagnosis not present

## 2021-07-18 DIAGNOSIS — Z7984 Long term (current) use of oral hypoglycemic drugs: Secondary | ICD-10-CM | POA: Insufficient documentation

## 2021-07-18 DIAGNOSIS — Q438 Other specified congenital malformations of intestine: Secondary | ICD-10-CM | POA: Insufficient documentation

## 2021-07-18 DIAGNOSIS — Z87891 Personal history of nicotine dependence: Secondary | ICD-10-CM | POA: Insufficient documentation

## 2021-07-18 DIAGNOSIS — R1013 Epigastric pain: Secondary | ICD-10-CM | POA: Insufficient documentation

## 2021-07-18 DIAGNOSIS — I10 Essential (primary) hypertension: Secondary | ICD-10-CM | POA: Diagnosis not present

## 2021-07-18 DIAGNOSIS — Z888 Allergy status to other drugs, medicaments and biological substances status: Secondary | ICD-10-CM | POA: Insufficient documentation

## 2021-07-18 DIAGNOSIS — I2510 Atherosclerotic heart disease of native coronary artery without angina pectoris: Secondary | ICD-10-CM | POA: Insufficient documentation

## 2021-07-18 DIAGNOSIS — Z951 Presence of aortocoronary bypass graft: Secondary | ICD-10-CM | POA: Insufficient documentation

## 2021-07-18 DIAGNOSIS — Z8249 Family history of ischemic heart disease and other diseases of the circulatory system: Secondary | ICD-10-CM | POA: Insufficient documentation

## 2021-07-18 DIAGNOSIS — K635 Polyp of colon: Secondary | ICD-10-CM | POA: Diagnosis not present

## 2021-07-18 DIAGNOSIS — Z794 Long term (current) use of insulin: Secondary | ICD-10-CM | POA: Diagnosis not present

## 2021-07-18 DIAGNOSIS — Z880 Allergy status to penicillin: Secondary | ICD-10-CM | POA: Insufficient documentation

## 2021-07-18 DIAGNOSIS — I255 Ischemic cardiomyopathy: Secondary | ICD-10-CM | POA: Diagnosis not present

## 2021-07-18 DIAGNOSIS — Z7951 Long term (current) use of inhaled steroids: Secondary | ICD-10-CM | POA: Insufficient documentation

## 2021-07-18 DIAGNOSIS — Z7982 Long term (current) use of aspirin: Secondary | ICD-10-CM | POA: Diagnosis not present

## 2021-07-18 DIAGNOSIS — C187 Malignant neoplasm of sigmoid colon: Secondary | ICD-10-CM | POA: Diagnosis not present

## 2021-07-18 DIAGNOSIS — E785 Hyperlipidemia, unspecified: Secondary | ICD-10-CM | POA: Diagnosis not present

## 2021-07-18 DIAGNOSIS — K648 Other hemorrhoids: Secondary | ICD-10-CM | POA: Insufficient documentation

## 2021-07-18 DIAGNOSIS — E119 Type 2 diabetes mellitus without complications: Secondary | ICD-10-CM | POA: Diagnosis not present

## 2021-07-18 HISTORY — PX: ESOPHAGOGASTRODUODENOSCOPY (EGD) WITH PROPOFOL: SHX5813

## 2021-07-18 HISTORY — PX: BIOPSY: SHX5522

## 2021-07-18 HISTORY — PX: COLONOSCOPY WITH PROPOFOL: SHX5780

## 2021-07-18 HISTORY — PX: POLYPECTOMY: SHX149

## 2021-07-18 LAB — GLUCOSE, CAPILLARY
Glucose-Capillary: 100 mg/dL — ABNORMAL HIGH (ref 70–99)
Glucose-Capillary: 66 mg/dL — ABNORMAL LOW (ref 70–99)
Glucose-Capillary: 97 mg/dL (ref 70–99)

## 2021-07-18 SURGERY — COLONOSCOPY WITH PROPOFOL
Anesthesia: General

## 2021-07-18 MED ORDER — OMEPRAZOLE 20 MG PO CPDR: 20.0000 mg | DELAYED_RELEASE_CAPSULE | Freq: Two times a day (BID) | ORAL | 5 refills | Status: DC

## 2021-07-18 MED ORDER — PROPOFOL 500 MG/50ML IV EMUL
INTRAVENOUS | Status: DC | PRN
  Administered 2021-07-18: 100 ug/kg/min via INTRAVENOUS

## 2021-07-18 MED ORDER — LACTATED RINGERS IV SOLN: INTRAVENOUS | Status: DC

## 2021-07-18 MED ORDER — DEXTROSE 50 % IV SOLN: 1.0000 | Freq: Once | INTRAVENOUS | Status: DC

## 2021-07-18 MED ORDER — DEXTROSE 50 % IV SOLN
25.0000 mL | Freq: Once | INTRAVENOUS | Status: AC
  Administered 2021-07-18: 25 mL via INTRAVENOUS

## 2021-07-18 MED ORDER — DEXTROSE 50 % IV SOLN
INTRAVENOUS | Status: AC
  Filled 2021-07-18: qty 50

## 2021-07-18 MED ORDER — PROPOFOL 10 MG/ML IV BOLUS
INTRAVENOUS | Status: DC | PRN
Start: 2021-07-18 — End: 2021-07-18
  Administered 2021-07-18: 50 mg via INTRAVENOUS
  Administered 2021-07-18: 20 mg via INTRAVENOUS
  Administered 2021-07-18: 80 mg via INTRAVENOUS

## 2021-07-18 MED ORDER — LIDOCAINE HCL (CARDIAC) PF 100 MG/5ML IV SOSY
PREFILLED_SYRINGE | INTRAVENOUS | Status: DC | PRN
  Administered 2021-07-18: 50 mg via INTRAVENOUS

## 2021-07-18 MED ORDER — STERILE WATER FOR IRRIGATION IR SOLN
Status: DC | PRN
  Administered 2021-07-18: 200 mL

## 2021-07-18 NOTE — Anesthesia Postprocedure Evaluation (Signed)
Anesthesia Post Note  Patient: ABAS SGRO  Procedure(s) Performed: COLONOSCOPY WITH PROPOFOL ESOPHAGOGASTRODUODENOSCOPY (EGD) WITH PROPOFOL BIOPSY POLYPECTOMY INTESTINAL  Patient location during evaluation: Phase II Anesthesia Type: General Level of consciousness: awake Pain management: pain level controlled Vital Signs Assessment: post-procedure vital signs reviewed and stable Respiratory status: spontaneous breathing and respiratory function stable Cardiovascular status: blood pressure returned to baseline and stable Postop Assessment: no headache and no apparent nausea or vomiting Anesthetic complications: no Comments: Late entry   No notable events documented.   Last Vitals:  Vitals:   07/18/21 0712 07/18/21 0915  BP: (!) 182/85 (!) 141/80  Pulse: 68   Resp:  14  Temp: 36.7 C 36.8 C  SpO2:  98%    Last Pain:  Vitals:   07/18/21 0915  TempSrc: Oral  PainSc: 0-No pain                 Louann Sjogren

## 2021-07-18 NOTE — Interval H&P Note (Signed)
History and Physical Interval Note:  07/18/2021 8:04 AM  Shane Chambers  has presented today for surgery, with the diagnosis of colon cancer screening, gerd, pill dysphagia.  The various methods of treatment have been discussed with the patient and family. After consideration of risks, benefits and other options for treatment, the patient has consented to  Procedure(s) with comments: COLONOSCOPY WITH PROPOFOL (N/A) - 8:00am ESOPHAGOGASTRODUODENOSCOPY (EGD) WITH PROPOFOL (N/A) BALLOON DILATION (N/A) as a surgical intervention.  The patient's history has been reviewed, patient examined, no change in status, stable for surgery.  I have reviewed the patient's chart and labs.  Questions were answered to the patient's satisfaction.     Eloise Harman

## 2021-07-18 NOTE — Discharge Instructions (Addendum)
EGD Discharge instructions Please read the instructions outlined below and refer to this sheet in the next few weeks. These discharge instructions provide you with general information on caring for yourself after you leave the hospital. Your doctor may also give you specific instructions. While your treatment has been planned according to the most current medical practices available, unavoidable complications occasionally occur. If you have any problems or questions after discharge, please call your doctor. ACTIVITY You may resume your regular activity but move at a slower pace for the next 24 hours.  Take frequent rest periods for the next 24 hours.  Walking will help expel (get rid of) the air and reduce the bloated feeling in your abdomen.  No driving for 24 hours (because of the anesthesia (medicine) used during the test).  You may shower.  Do not sign any important legal documents or operate any machinery for 24 hours (because of the anesthesia used during the test).  NUTRITION Drink plenty of fluids.  You may resume your normal diet.  Begin with a light meal and progress to your normal diet.  Avoid alcoholic beverages for 24 hours or as instructed by your caregiver.  MEDICATIONS You may resume your normal medications unless your caregiver tells you otherwise.  WHAT YOU CAN EXPECT TODAY You may experience abdominal discomfort such as a feeling of fullness or "gas" pains.  FOLLOW-UP Your doctor will discuss the results of your test with you.  SEEK IMMEDIATE MEDICAL ATTENTION IF ANY OF THE FOLLOWING OCCUR: Excessive nausea (feeling sick to your stomach) and/or vomiting.  Severe abdominal pain and distention (swelling).  Trouble swallowing.  Temperature over 101 F (37.8 C).  Rectal bleeding or vomiting of blood.    Colonoscopy Discharge Instructions  Read the instructions outlined below and refer to this sheet in the next few weeks. These discharge instructions provide you with  general information on caring for yourself after you leave the hospital. Your doctor may also give you specific instructions. While your treatment has been planned according to the most current medical practices available, unavoidable complications occasionally occur.   ACTIVITY You may resume your regular activity, but move at a slower pace for the next 24 hours.  Take frequent rest periods for the next 24 hours.  Walking will help get rid of the air and reduce the bloated feeling in your belly (abdomen).  No driving for 24 hours (because of the medicine (anesthesia) used during the test).   Do not sign any important legal documents or operate any machinery for 24 hours (because of the anesthesia used during the test).  NUTRITION Drink plenty of fluids.  You may resume your normal diet as instructed by your doctor.  Begin with a light meal and progress to your normal diet. Heavy or fried foods are harder to digest and may make you feel sick to your stomach (nauseated).  Avoid alcoholic beverages for 24 hours or as instructed.  MEDICATIONS You may resume your normal medications unless your doctor tells you otherwise.  WHAT YOU CAN EXPECT TODAY Some feelings of bloating in the abdomen.  Passage of more gas than usual.  Spotting of blood in your stool or on the toilet paper.  IF YOU HAD POLYPS REMOVED DURING THE COLONOSCOPY: No aspirin products for 7 days or as instructed.  No alcohol for 7 days or as instructed.  Eat a soft diet for the next 24 hours.  FINDING OUT THE RESULTS OF YOUR TEST Not all test results are available  during your visit. If your test results are not back during the visit, make an appointment with your caregiver to find out the results. Do not assume everything is normal if you have not heard from your caregiver or the medical facility. It is important for you to follow up on all of your test results.  SEEK IMMEDIATE MEDICAL ATTENTION IF: You have more than a spotting of  blood in your stool.  Your belly is swollen (abdominal distention).  You are nauseated or vomiting.  You have a temperature over 101.  You have abdominal pain or discomfort that is severe or gets worse throughout the day.   Your EGD revealed a moderate amount of inflammation in your stomach.  I took biopsies of this to rule out infection a bacteria called H. pylori.  Await pathology results, my office will contact you.  I am going to increase your omeprazole to 20 mg twice daily.  Your colonoscopy revealed 2 polyp(s) which I removed successfully. One was large. Await pathology results, my office will contact you. I recommend repeating colonoscopy in 3 years for surveillance purposes.   I hope you have a great rest of your week!  [name]. Abbey Chatters, D.O. Gastroenterology and Hepatology [facility]

## 2021-07-18 NOTE — Op Note (Signed)
[facility] Patient Name: Shane Chambers Procedure Date: 07/18/2021 8:06 AM MRN: [id number] Date of Birth: 1961-04-19 Attending MD: [name]. Abbey Chatters DO CSN: [id number] Age: 60 Admit Type: Outpatient Procedure:                Upper GI endoscopy Indications:              Epigastric abdominal pain, Heartburn Providers:                [name]. Abbey Chatters, DO, Caprice Kluver, Crystal Page,                            Nelma Rothman, Technician Referring MD:              Medicines:                See the Anesthesia note for documentation of the                            administered medications Complications:            No immediate complications. Estimated Blood Loss:     Estimated blood loss was minimal. Procedure:                Pre-Anesthesia Assessment:                           - The anesthesia plan was to use monitored                            anesthesia care (MAC).                           After obtaining informed consent, the endoscope was                            passed under direct vision. Throughout the                            procedure, the patient's blood pressure, pulse, and                            oxygen saturations were monitored continuously. The                            [id number]-H190 ([id number]) scope was introduced through the                            mouth, and advanced to the second part of duodenum.                            The upper GI endoscopy was accomplished without                            difficulty. The patient tolerated the procedure                            well. Scope In:  8:35:19 AM Scope Out: 8:41:20 AM Total Procedure Duration: 0 hours 6 minutes 1 second  Findings:      There is no endoscopic evidence of bleeding, areas of erosion, hiatal       hernia, stenosis, stricture, ulcerations or varices in the entire       esophagus.      One 6 mm semi-sessile polyp with no bleeding and no stigmata of recent       bleeding was found in the cardia  just distal to GEJ. Biopsies were taken       with a cold forceps for histology.      Patchy mild inflammation characterized by erythema was found in the       entire examined stomach. Biopsies were taken with a cold forceps for       Helicobacter pylori testing.      The duodenal bulb, first portion of the duodenum and second portion of       the duodenum were normal. Impression:               - One gastric polyp. Biopsied.                           - Gastritis. Biopsied.                           - Normal duodenal bulb, first portion of the                            duodenum and second portion of the duodenum. Moderate Sedation:      Per Anesthesia Care Recommendation:           - Patient has a contact number available for                            emergencies. The signs and symptoms of potential                            delayed complications were discussed with the                            patient. Return to normal activities tomorrow.                            Written discharge instructions were provided to the                            patient.                           - Resume previous diet.                           - Continue present medications.                           - Await pathology results.                           - Use Prilosec (  omeprazole) 20 mg PO BID.                           - Return to GI clinic in 6 months. Procedure Code(s):        --- Professional ---                           [phone], Esophagogastroduodenoscopy, flexible,                            transoral; with biopsy, single or multiple Diagnosis Code(s):        --- Professional ---                           K31.7, Polyp of stomach and duodenum                           K29.70, Gastritis, unspecified, without bleeding                           R10.13, Epigastric pain                           R12, Heartburn CPT copyright 2019 American Medical Association. All rights reserved. The codes  documented in this report are preliminary and upon coder review may  be revised to meet current compliance requirements. [name]. Abbey Chatters, DO Pinole Abbey Chatters, DO 07/18/2021 9:17:49 AM This report has been signed electronically. Number of Addenda: 0

## 2021-07-18 NOTE — Transfer of Care (Signed)
Immediate Anesthesia Transfer of Care Note  Patient: Shane Chambers  Procedure(s) Performed: COLONOSCOPY WITH PROPOFOL ESOPHAGOGASTRODUODENOSCOPY (EGD) WITH PROPOFOL BIOPSY POLYPECTOMY INTESTINAL  Patient Location: Short Stay  Anesthesia Type:General  Level of Consciousness: drowsy  Airway & Oxygen Therapy: Patient Spontanous Breathing  Post-op Assessment: Report given to RN and Post -op Vital signs reviewed and stable  Post vital signs: Reviewed and stable  Last Vitals:  Vitals Value Taken Time  BP    Temp    Pulse    Resp    SpO2      Last Pain:  Vitals:   07/18/21 0825  TempSrc:   PainSc: 0-No pain      Patients Stated Pain Goal: 5 ([id number])  Complications: No notable events documented.

## 2021-07-18 NOTE — Op Note (Addendum)
[facility] Patient Name: Shane Chambers Procedure Date: 07/18/2021 8:41 AM MRN: [id number] Date of Birth: [birth year] Attending MD: [name]. Abbey Chatters DO CSN: [id number] Age: 60 Admit Type: Outpatient Procedure:                Colonoscopy Indications:              Screening for colorectal malignant neoplasm Providers:                [name]. Abbey Chatters, DO, Caprice Kluver, Crystal Page,                            Nelma Rothman, Technician Referring MD:              Medicines:                See the Anesthesia note for documentation of the                            administered medications Complications:            No immediate complications. Estimated Blood Loss:     Estimated blood loss was minimal. Procedure:                Pre-Anesthesia Assessment:                           - The anesthesia plan was to use monitored                            anesthesia care (MAC).                           After obtaining informed consent, the colonoscope                            was passed under direct vision. Throughout the                            procedure, the patient's blood pressure, pulse, and                            oxygen saturations were monitored continuously. The                            [id number]-HQ190L ([id number]) scope was introduced through                            the anus and advanced to the the cecum, identified                            by appendiceal orifice and ileocecal valve. The                            colonoscopy was performed without difficulty. The                            patient tolerated the procedure well.  The quality                            of the bowel preparation was evaluated using the                            BBPS [facility] Bowel Preparation Scale) with scores                            of: Right Colon = 2 (minor amount of residual                            staining, small fragments of stool and/or opaque                            liquid, but mucosa  seen well), Transverse Colon = 2                            (minor amount of residual staining, small fragments                            of stool and/or opaque liquid, but mucosa seen                            well) and Left Colon = 2 (minor amount of residual                            staining, small fragments of stool and/or opaque                            liquid, but mucosa seen well). The total BBPS score                            equals 6. The quality of the bowel preparation was                            fair. Scope In: 8:45:36 AM Scope Out: 9:09:52 AM Scope Withdrawal Time: 0 hours 15 minutes 47 seconds  Total Procedure Duration: 0 hours 24 minutes 16 seconds  Findings:      The perianal and digital rectal examinations were normal.      Non-bleeding internal hemorrhoids were found during endoscopy.      A 15 mm polyp was found in the sigmoid colon. The polyp was sessile. The       polyp was removed with a hot snare. Resection and retrieval were       complete.      A 5 mm polyp was found in the sigmoid colon. The polyp was sessile. The       polyp was removed with a cold snare. Resection and retrieval were       complete.      The colon (entire examined portion) was moderately redundant. Advancing       the scope required applying abdominal pressure.      The colon (entire examined portion) revealed moderately excessive  looping. Impression:               - Preparation of the colon was fair.                           - Non-bleeding internal hemorrhoids.                           - One 15 mm polyp in the sigmoid colon, removed                            with a hot snare. Resected and retrieved.                           - One 5 mm polyp in the sigmoid colon, removed with                            a cold snare. Resected and retrieved.                           - Redundant colon.                           - There was significant looping of the colon. Moderate  Sedation:      Per Anesthesia Care Recommendation:           - Patient has a contact number available for                            emergencies. The signs and symptoms of potential                            delayed complications were discussed with the                            patient. Return to normal activities tomorrow.                            Written discharge instructions were provided to the                            patient.                           - Resume previous diet.                           - Continue present medications.                           - Await pathology results.                           - Repeat colonoscopy in 3 years for surveillance.                           - Return  to GI clinic in 6 months. Procedure Code(s):        --- Professional ---                           [id number], Colonoscopy, flexible; with removal of                            tumor(s), polyp(s), or other lesion(s) by snare                            technique Diagnosis Code(s):        --- Professional ---                           Z12.11, Encounter for screening for malignant                            neoplasm of colon                           K64.8, Other hemorrhoids                           K63.5, Polyp of colon                           Q43.8, Other specified congenital malformations of                            intestine CPT copyright 2019 American Medical Association. All rights reserved. The codes documented in this report are preliminary and upon coder review may  be revised to meet current compliance requirements. [name]. Abbey Chatters, DO Kyle Abbey Chatters, DO 07/18/2021 9:15:08 AM This report has been signed electronically. Number of Addenda: 1 Addendum Number: 1   Addendum Date: 08/26/2021 2:45:36 PM      33m polyp removed was in the rectum, NOT sigmoid colon. CElon Alas CAbbey Chatters DO CWinfieldCAbbey Chatters DO 08/26/2021 2:46:17 PM This report has been signed electronically.

## 2021-07-18 NOTE — Anesthesia Preprocedure Evaluation (Addendum)
Anesthesia Evaluation  Patient identified by MRN, date of birth, ID band Patient awake    Reviewed: Allergy & Precautions, H&P , NPO status , Patient's Chart, lab work & pertinent test results, reviewed documented beta blocker date and time   Airway Mallampati: II  TM Distance: >3 FB Neck ROM: full    Dental no notable dental hx. (+) Loose,    Pulmonary sleep apnea , COPD, former smoker,    Pulmonary exam normal breath sounds clear to auscultation       Cardiovascular Exercise Tolerance: Good hypertension, + CAD, + CABG, + Peripheral Vascular Disease and +CHF   Rhythm:regular Rate:Normal     Neuro/Psych PSYCHIATRIC DISORDERS Depression negative neurological ROS     GI/Hepatic negative GI ROS, Neg liver ROS,   Endo/Other  negative endocrine ROSdiabetes  Renal/GU negative Renal ROS  negative genitourinary   Musculoskeletal   Abdominal   Peds  Hematology  (+) Blood dyscrasia, anemia ,   Anesthesia Other Findings   Reproductive/Obstetrics negative OB ROS                            Anesthesia Physical Anesthesia Plan  ASA: 3  Anesthesia Plan: General   Post-op Pain Management:    Induction:   PONV Risk Score and Plan: Propofol infusion  Airway Management Planned:   Additional Equipment:   Intra-op Plan:   Post-operative Plan:   Informed Consent: I have reviewed the patients History and Physical, chart, labs and discussed the procedure including the risks, benefits and alternatives for the proposed anesthesia with the patient or authorized representative who has indicated his/her understanding and acceptance.     Dental Advisory Given  Plan Discussed with: CRNA  Anesthesia Plan Comments:         Anesthesia Quick Evaluation

## 2021-07-24 ENCOUNTER — Encounter (HOSPITAL_COMMUNITY): Payer: Self-pay | Admitting: Internal Medicine

## 2021-07-24 NOTE — Progress Notes (Signed)
Cardiology Office Note  Date: 07/25/2021   ID: CRUSE HEINZERLING, DOB 1961/03/25, MRN [id number]  PCP:  System, Provider Not In  Cardiologist:  Carlyle Dolly, MD Electrophysiologist:  None   Chief Complaint: 48-monthfollow-up  History of Present Illness: Shane PITTSLEYis a 60y.o. male with a history of HTN, acute on chronic diastolic heart failure, ischemic cardiomyopathy, CAD/, OSA, BPH, DM2, SOB, HLD.  Status post CABG May 2021 LIMA-LAD, RIMA-PL, RA-D1-RI-OM1 in setting of NSTEMI  He was last seen by Dr. BHarl Bowieon 02/21/2021.  He had no anginal symptoms.  Plan was to stop Plavix on May 1 and continue other medications.  Had some ongoing edema.  Weights were slowly trending down on torsemide.  He was continuing current dose.  Plan was to continue current dose.  If renal function was stable could consider titration.  Was continuing statin medications for hyperlipidemia.  [facility]  He is here for 462-monthollow-up.  He appears to have a somewhat depressed affect.  His lower legs continue with significant edema and redness with blisters present on right lower extremity foot area with numerous areas of redness along both legs below the knee. He states he is not currently undergoing wound care therapy.  Since July 03, 2021 he has gained around 6 pounds.  He currently denies any DOE or SOB.  He is sitting in a wheelchair.  He has not very mobile or active due to his condition.  Dr. BrHarl Bowieentioned at last visit could uptitrate treatment torsemide if renal function was stable.  Recent labs on 07/16/2021.  Creatinine of 1.96 and GFR greater than 60.   Past Medical History:  Diagnosis Date   CAD (coronary artery disease)    a. s/p CABG in 03/2020 with LIMA-LAD, RIMA-PL, RA-D1-RI-OM1   Cellulitis    Depression    Diabetes mellitus without complication ([facility]   Hyperlipidemia 12/01/2019   Hypertension    Ischemic cardiomyopathy    a. EF 20-25% by echo in  02/2020 b. at 40% by echo in 03/2020 c. EF normalized to 60-65% by echo in 04/2020   Peripheral vascular disease ([facility]   Sleep apnea    Type 2 diabetes mellitus ([place]    Past Surgical History:  Procedure Laterality Date   BIOPSY  07/18/2021   Procedure: BIOPSY;  Surgeon: CaEloise HarmanDO;  [facility];  Service: Endoscopy;;   COLONOSCOPY WITH PROPOFOL N/A 07/18/2021   Procedure: COLONOSCOPY WITH PROPOFOL;  Surgeon: CaEloise HarmanDO;  [facility];  Service: Endoscopy;  Laterality: N/A;  8:00am   CORONARY ARTERY BYPASS GRAFT N/A 03/13/2020   Procedure: CORONARY ARTERY BYPASS GRAFTING (CABG) times five using bilateral Internal mammary arteries and left radial artery;  Surgeon: AtWonda OldsMD;  [facility];  Laterality: N/A;   DENTAL SURGERY     ESOPHAGOGASTRODUODENOSCOPY (EGD) WITH PROPOFOL N/A 07/18/2021   Procedure: ESOPHAGOGASTRODUODENOSCOPY (EGD) WITH PROPOFOL;  Surgeon: CaEloise HarmanDO;  [facility];  Service: Endoscopy;  Laterality: N/A;   POLYPECTOMY  07/18/2021   Procedure: POLYPECTOMY INTESTINAL;  Surgeon: CaEloise HarmanDO;  [facility];  Service: Endoscopy;;   RADIAL ARTERY HARVEST Left 03/13/2020   Procedure: RAEstero  Surgeon: AtWonda OldsMD;  [facility]: [facility];  Laterality: Left;   RIGHT/LEFT HEART CATH AND CORONARY ANGIOGRAPHY N/A  03/07/2020   Procedure: RIGHT/LEFT HEART CATH AND CORONARY ANGIOGRAPHY;  Surgeon: Sherren Mocha, MD;  Location: [facility];  Service: Cardiovascular;  Laterality: N/A;   TEE WITHOUT CARDIOVERSION N/A 03/13/2020   Procedure: TRANSESOPHAGEAL ECHOCARDIOGRAM (TEE);  Surgeon: [name], MD;  Location: [place] [place];  Service: Open Heart Surgery;  Laterality: N/A;    Current Outpatient Medications  Medication Sig Dispense Refill   acetaminophen (TYLENOL) 500 MG tablet Take 1,000 mg by mouth every 8  (eight) hours as needed for fever or moderate pain.     albuterol (VENTOLIN HFA) 108 (90 Base) MCG/ACT inhaler Inhale 1-2 puffs into the lungs every 6 (six) hours as needed for wheezing or shortness of breath. (Patient taking differently: Inhale 2 puffs into the lungs every 6 (six) hours as needed for wheezing or shortness of breath.) 18 g 3   aspirin 81 MG EC tablet Take 1 tablet (81 mg total) by mouth daily with breakfast. 30 tablet 12   atorvastatin (LIPITOR) 80 MG tablet Take 80 mg by mouth daily.     budesonide-formoterol (SYMBICORT) 160-4.5 MCG/ACT inhaler Inhale 2 puffs into the lungs in the morning and at bedtime. 1 Inhaler 6   Cholecalciferol (VITAMIN D) 50 MCG (2000 UT) tablet Take 2,000 Units by mouth daily.     gabapentin (NEURONTIN) 300 MG capsule Take 900 mg by mouth 2 (two) times daily.     insulin aspart protamine- aspart (NOVOLOG MIX 70/30) (70-30) 100 UNIT/ML injection Inject 15 Units into the skin 2 (two) times daily with a meal.     ketotifen (ZADITOR) 0.025 % ophthalmic solution Place 1 drop into both eyes every morning.     levothyroxine (SYNTHROID) 75 MCG tablet Take 75 mcg by mouth daily before breakfast.     loperamide HCl (IMODIUM) 2 MG/15ML solution Take 4 mg by mouth every 6 (six) hours as needed for diarrhea or loose stools. 52m as needed     losartan (COZAAR) 50 MG tablet Take 50 mg by mouth daily.     magnesium oxide (MAG-OX) 400 MG tablet Take 400 mg by mouth daily.     metFORMIN (GLUCOPHAGE) 1000 MG tablet Take 1,000 mg by mouth 2 (two) times daily with a meal.     metoprolol succinate (TOPROL-XL) 25 MG 24 hr tablet Take 0.5 tablets (12.5 mg total) by mouth 2 (two) times daily. 90 tablet 1   polyethylene glycol (MIRALAX / GLYCOLAX) 17 g packet Take 17 g by mouth daily as needed for moderate constipation.     potassium chloride SA (KLOR-CON) 20 MEQ tablet Take 1 tablet (20 mEq total) by mouth daily. 90 tablet 1   tamsulosin (FLOMAX) 0.4 MG CAPS capsule Take 0.8 mg by  mouth daily.     traMADol (ULTRAM) 50 MG tablet Take 50 mg by mouth every 6 (six) hours as needed for moderate pain.     venlafaxine XR (EFFEXOR-XR) 150 MG 24 hr capsule Take 150 mg by mouth daily with breakfast.     polyethylene glycol-electrolytes (NULYTELY) 420 g solution As directed (Patient not taking: No sig reported) 4000 mL 0   torsemide (DEMADEX) 20 MG tablet Take 3 tablets (60 mg total) by mouth daily. & 20 mg daily as needed for 3 lb weight gain in 24 hours or 5 lbs in one week 90 tablet 6   No current facility-administered medications for this visit.   Allergies:  Ace inhibitors, Other, and Penicillins   Social History: The patient  reports that he  quit smoking about 26 years ago. His smoking use included cigarettes. He smoked an average of 1.5 packs per day. He has never used smokeless tobacco. He reports current alcohol use. He reports that he does not use drugs.   Family History: The patient's family history includes Hypertension in his mother.   ROS:  Please see the history of present illness. Otherwise, complete review of systems is positive for none.  All other systems are reviewed and negative.   Physical Exam: VS:  BP (!) 150/70   Pulse 76   Ht '5\' 9"'$  (1.753 m)   Wt 285 lb (129.3 kg)   SpO2 97%   BMI 42.09 kg/m , BMI Body mass index is 42.09 kg/m.  Wt Readings from Last 3 Encounters:  07/25/21 285 lb (129.3 kg)  07/18/21 281 lb (127.5 kg)  07/16/21 281 lb (127.5 kg)    General: Patient appears comfortable at rest. Neck: Supple, no elevated JVP or carotid bruits, no thyromegaly. Lungs: Clear to auscultation, nonlabored breathing at rest. Cardiac: Regular rate and rhythm, no S3 or significant systolic murmur, no pericardial rub. Extremities: 1+ pitting edema distal pulses 1. Skin: Warm and dry. Musculoskeletal: No kyphosis. Neuropsychiatric: Alert and oriented x3, affect grossly appropriate.  ECG:    Recent Labwork: 08/07/2020: Magnesium 2.0 10/15/2020: ALT  21; AST 24 11/22/2020: B Natriuretic Peptide 65.0 07/16/2021: BUN 22; Creatinine, Ser 0.96; Hemoglobin 12.1; Platelets 329; Potassium 4.0; Sodium 138     Component Value Date/Time   CHOL 140 08/02/2020 0428   TRIG 85 08/02/2020 0428   HDL 41 08/02/2020 0428   CHOLHDL 3.4 08/02/2020 0428   VLDL 17 08/02/2020 0428   LDLCALC 82 08/02/2020 0428    Other Studies Reviewed Today:  Echocardiogram 08/03/2020 IMPRESSIONS   1. Left ventricular ejection fraction, by estimation, is 55 to 60%. The  left ventricle has normal function. The left ventricle has no regional  wall motion abnormalities. There is mild left ventricular hypertrophy.  Left ventricular diastolic parameters  are consistent with Grade II diastolic dysfunction (pseudonormalization).  Elevated left atrial pressure.   2. There is moderate calcification of the aortic valve. There is moderate  thickening of the aortic valve.   3. Limited echo to evaluate LV function    Assessment and Plan:  1. CAD in native artery   2. Chronic diastolic heart failure ([place])   3. Mixed hyperlipidemia   4. Medication management    1. CAD in native artery Denies any anginal or exertional symptoms.  Continue aspirin 81 mg daily, continue Toprol-XL 12.5 mg daily.  2. Chronic diastolic heart failure ([place]) Continues with lower extremity edema.  He has gained some weight about 6 pounds since August 31.  Recent renal function labs were normal.  Increase torsemide to 60 mg p.o. daily and may take an extra 20 mg as needed.  Continue potassium supplementation.  Continue losartan 50 mg daily.  Continue Toprol-XL 12.5 mg.  Get a follow-up email and magnesium in 2 weeks.  3. Mixed hyperlipidemia Continue atorvastatin 80 mg p.o. daily.  Last LDL was 92 on 03/08/2020   Medication Adjustments/Labs and Tests Ordered: Current medicines are reviewed at length with the patient today.  Concerns regarding medicines are outlined above.   Disposition: Follow-up with  Dr. Harl Bowie or APP 6 months  Signed, Levell July, NP 07/25/2021 10:38 AM    [facility] [place], [place], [place] [address] Phone: [phone]; Fax: [phone]

## 2021-07-25 ENCOUNTER — Ambulatory Visit (INDEPENDENT_AMBULATORY_CARE_PROVIDER_SITE_OTHER): Payer: Medicare Other | Admitting: Family Medicine

## 2021-07-25 ENCOUNTER — Encounter: Payer: Self-pay | Admitting: Family Medicine

## 2021-07-25 VITALS — BP 150/70 | HR 76 | Ht 69.0 in | Wt 285.0 lb

## 2021-07-25 DIAGNOSIS — Z79899 Other long term (current) drug therapy: Secondary | ICD-10-CM | POA: Diagnosis not present

## 2021-07-25 DIAGNOSIS — I2510 Atherosclerotic heart disease of native coronary artery without angina pectoris: Secondary | ICD-10-CM

## 2021-07-25 DIAGNOSIS — I5032 Chronic diastolic (congestive) heart failure: Secondary | ICD-10-CM

## 2021-07-25 DIAGNOSIS — E782 Mixed hyperlipidemia: Secondary | ICD-10-CM

## 2021-07-25 MED ORDER — TORSEMIDE 20 MG PO TABS: 60.0000 mg | ORAL_TABLET | Freq: Every day | ORAL | 6 refills | Status: DC

## 2021-07-25 NOTE — Patient Instructions (Signed)
Medication Instructions:  Your physician has recommended you make the following change in your medication:  Increase torsemide to 60 mg daily. May take an extra 20 mg daily as needed for 3 lbs weight gain in 24 hours or 5 lbs in one week Continue other medications the same  Labwork: BMET & Mg in 2 weeks-fax results to our office [phone]  Testing/Procedures: none  Follow-Up: Your physician recommends that you schedule a follow-up appointment in: 6 months  Any Other Special Instructions Will Be Listed Below (If Applicable).  If you need a refill on your cardiac medications before your next appointment, please call your pharmacy.

## 2021-07-30 ENCOUNTER — Emergency Department (HOSPITAL_COMMUNITY)
Admission: EM | Admit: 2021-07-30 | Discharge: 2021-07-31 | Disposition: A | Discharge status: Skilled Nursing Facility | Payer: Medicare Other | Attending: Emergency Medicine | Admitting: Emergency Medicine

## 2021-07-30 ENCOUNTER — Encounter (HOSPITAL_COMMUNITY): Payer: Self-pay

## 2021-07-30 DIAGNOSIS — J441 Chronic obstructive pulmonary disease with (acute) exacerbation: Secondary | ICD-10-CM | POA: Insufficient documentation

## 2021-07-30 DIAGNOSIS — L03115 Cellulitis of right lower limb: Secondary | ICD-10-CM | POA: Diagnosis not present

## 2021-07-30 DIAGNOSIS — I110 Hypertensive heart disease with heart failure: Secondary | ICD-10-CM | POA: Diagnosis not present

## 2021-07-30 DIAGNOSIS — Z79899 Other long term (current) drug therapy: Secondary | ICD-10-CM | POA: Insufficient documentation

## 2021-07-30 DIAGNOSIS — Z794 Long term (current) use of insulin: Secondary | ICD-10-CM | POA: Diagnosis not present

## 2021-07-30 DIAGNOSIS — Z7984 Long term (current) use of oral hypoglycemic drugs: Secondary | ICD-10-CM | POA: Diagnosis not present

## 2021-07-30 DIAGNOSIS — Z87891 Personal history of nicotine dependence: Secondary | ICD-10-CM | POA: Insufficient documentation

## 2021-07-30 DIAGNOSIS — I5042 Chronic combined systolic (congestive) and diastolic (congestive) heart failure: Secondary | ICD-10-CM | POA: Diagnosis not present

## 2021-07-30 DIAGNOSIS — E119 Type 2 diabetes mellitus without complications: Secondary | ICD-10-CM | POA: Diagnosis not present

## 2021-07-30 DIAGNOSIS — Z8616 Personal history of COVID-19: Secondary | ICD-10-CM | POA: Insufficient documentation

## 2021-07-30 DIAGNOSIS — R2243 Localized swelling, mass and lump, lower limb, bilateral: Secondary | ICD-10-CM | POA: Insufficient documentation

## 2021-07-30 DIAGNOSIS — I2510 Atherosclerotic heart disease of native coronary artery without angina pectoris: Secondary | ICD-10-CM | POA: Diagnosis not present

## 2021-07-30 DIAGNOSIS — Z7982 Long term (current) use of aspirin: Secondary | ICD-10-CM | POA: Diagnosis not present

## 2021-07-30 DIAGNOSIS — Z951 Presence of aortocoronary bypass graft: Secondary | ICD-10-CM | POA: Insufficient documentation

## 2021-07-30 DIAGNOSIS — M79604 Pain in right leg: Secondary | ICD-10-CM | POA: Diagnosis present

## 2021-07-30 DIAGNOSIS — Z7951 Long term (current) use of inhaled steroids: Secondary | ICD-10-CM | POA: Insufficient documentation

## 2021-07-30 LAB — CBC WITH DIFFERENTIAL/PLATELET
Abs Immature Granulocytes: 0.19 10*3/uL — ABNORMAL HIGH (ref 0.00–0.07)
Basophils Absolute: 0.1 10*3/uL (ref 0.0–0.1)
Basophils Relative: 1 %
Eosinophils Absolute: 0.8 10*3/uL — ABNORMAL HIGH (ref 0.0–0.5)
Eosinophils Relative: 5 %
HCT: 36.7 % — ABNORMAL LOW (ref 39.0–52.0)
Hemoglobin: 11.4 g/dL — ABNORMAL LOW (ref 13.0–17.0)
Immature Granulocytes: 1 %
Lymphocytes Relative: 11 %
Lymphs Abs: 1.7 10*3/uL (ref 0.7–4.0)
MCH: 27.7 pg (ref 26.0–34.0)
MCHC: 31.1 g/dL (ref 30.0–36.0)
MCV: 89.3 fL (ref 80.0–100.0)
Monocytes Absolute: 1.5 10*3/uL — ABNORMAL HIGH (ref 0.1–1.0)
Monocytes Relative: 9 %
Neutro Abs: 11.3 10*3/uL — ABNORMAL HIGH (ref 1.7–7.7)
Neutrophils Relative %: 73 %
Platelets: 386 10*3/uL (ref 150–400)
RBC: 4.11 MIL/uL — ABNORMAL LOW (ref 4.22–5.81)
RDW: 13.1 % (ref 11.5–15.5)
WBC: 15.5 10*3/uL — ABNORMAL HIGH (ref 4.0–10.5)
nRBC: 0 % (ref 0.0–0.2)

## 2021-07-30 LAB — BASIC METABOLIC PANEL
Anion gap: 9 (ref 5–15)
BUN: 20 mg/dL (ref 6–20)
CO2: 27 mmol/L (ref 22–32)
Calcium: 8.3 mg/dL — ABNORMAL LOW (ref 8.9–10.3)
Chloride: 100 mmol/L (ref 98–111)
Creatinine, Ser: 1.18 mg/dL (ref 0.61–1.24)
GFR, Estimated: >60 mL/min (ref >60)
Glucose, Bld: 112 mg/dL — ABNORMAL HIGH (ref 70–99)
Potassium: 4 mmol/L (ref 3.5–5.1)
Sodium: 136 mmol/L (ref 135–145)

## 2021-07-30 MED ORDER — OXYCODONE-ACETAMINOPHEN 5-325 MG PO TABS: 1.0000 | ORAL_TABLET | Freq: Three times a day (TID) | ORAL | 0 refills | Status: DC | PRN

## 2021-07-30 MED ORDER — DOXYCYCLINE HYCLATE 100 MG PO CAPS: 100.0000 mg | ORAL_CAPSULE | Freq: Two times a day (BID) | ORAL | 0 refills | Status: DC

## 2021-07-30 NOTE — ED Provider Notes (Signed)
[facility] EMERGENCY DEPARTMENT Provider Note   CSN: [id number] Arrival date & time: 07/30/21  1648     History Chief Complaint  Shane Chambers presents with   Leg Pain    Shane Chambers is a 60 y.o. male.   Leg Pain Associated symptoms: no back pain   Shane Chambers presents from nursing home.  [facility].  Shane Chambers has cellulitis on his legs for the last few days.  States had a virtual visit with the provider from [place].  States she said he had an infection.  States he then decided he needed to come to the ER.  Has been on tramadol for it.  Has chronic edema the legs but states usually not warm like it is now.  States Shane Chambers.  No fevers.  Feels well overall, but increasing pain in the legs.    Past Medical History:  Diagnosis Date   CAD (coronary artery disease)    a. s/p CABG in 03/2020 with LIMA-LAD, RIMA-PL, RA-[id number]-OM1   Cellulitis    Depression    Diabetes mellitus without complication ([place])    Hyperlipidemia 12/01/2019   Hypertension    Ischemic cardiomyopathy    a. EF 20-25% by echo in 02/2020 b. at 40% by echo in 03/2020 c. 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   Ischemic cardiomyopathy    Coronary artery disease    Acute on chronic combined systolic and diastolic CHF (congestive heart failure) ([place]) 03/05/2020   Acute hyponatremia 03/05/2020   Severe Vitamin D deficiency 12/02/2019   Hypokalemia 12/01/2019    Hyperlipidemia 12/01/2019   BPH (benign prostatic hyperplasia) 12/01/2019   Normocytic anemia 12/01/2019   Pneumonia due to COVID-19 virus 12/01/2019   Hypoxia    SOB (shortness of breath) 11/16/2019   Hypertension    Type 2 diabetes mellitus ([place])    Depression    Acute bronchitis with bronchospasm 11/15/2019    Past Surgical History:  Procedure Laterality Date   BIOPSY  07/18/2021   Procedure: BIOPSY;  Surgeon: Eloise Harman, DO;  [facility];  Service: Endoscopy;;   COLONOSCOPY WITH PROPOFOL N/A 07/18/2021   Procedure: COLONOSCOPY WITH PROPOFOL;  Surgeon: Eloise Harman, DO;  [facility];  Service: Endoscopy;  Laterality: N/A;  8:00am   CORONARY ARTERY BYPASS GRAFT N/A 03/13/2020   Procedure: CORONARY ARTERY BYPASS GRAFTING (CABG) times five using bilateral Internal mammary arteries and left radial artery;  Surgeon: Shane Olds, MD;  Location: [place];  Service: Open Heart Surgery;  Laterality: N/A;   DENTAL SURGERY     ESOPHAGOGASTRODUODENOSCOPY (EGD) WITH PROPOFOL N/A 07/18/2021   Procedure: ESOPHAGOGASTRODUODENOSCOPY (EGD) WITH PROPOFOL;  Surgeon: Eloise Harman, DO;  [facility];  Service: Endoscopy;  Laterality: N/A;   POLYPECTOMY  07/18/2021   Procedure: POLYPECTOMY INTESTINAL;  Surgeon: Eloise Harman, DO;  [facility];  Service: Endoscopy;;  RADIAL ARTERY HARVEST Left 03/13/2020   Procedure: RADIAL ARTERY HARVEST,;  Surgeon: Shane Olds, MD;  Location: [place];  Service: Open Heart Surgery;  Laterality: Left;   RIGHT/LEFT HEART CATH AND CORONARY ANGIOGRAPHY N/A 03/07/2020   Procedure: RIGHT/LEFT HEART CATH AND CORONARY ANGIOGRAPHY;  Surgeon: Shane Mocha, MD;  Location: [place] [facility];  Service: Cardiovascular;  Laterality: N/A;   TEE WITHOUT CARDIOVERSION N/A 03/13/2020   Procedure: TRANSESOPHAGEAL ECHOCARDIOGRAM (TEE);  Surgeon: Shane Olds, MD;  Location: [place];  Service: Open Heart Surgery;  Laterality:  N/A;       Family History  Problem Relation Age of Onset   Hypertension Mother     Social History   Tobacco Use   Smoking status: Former    Packs/day: 1.50    Types: Cigarettes    Quit date: 05/25/1995    Years since quitting: 26.2   Smokeless tobacco: Never  Vaping Use   Vaping Use: Never used  Substance Use Topics   Alcohol use: Yes    Comment: rarely   Drug use: No    Home Medications Prior to Admission medications   Medication Sig Start Date End Date Taking? Authorizing Provider  doxycycline (VIBRAMYCIN) 100 MG capsule Take 1 capsule (100 mg total) by mouth 2 (two) times daily. 07/30/21  Yes Shane Belling, MD  oxyCODONE-acetaminophen (PERCOCET/ROXICET) 5-325 MG tablet Take 1-2 tablets by mouth every 8 (eight) hours as needed for severe pain. 07/30/21  Yes Shane Belling, MD  acetaminophen (TYLENOL) 500 MG tablet Take 1,000 mg by mouth every 8 (eight) hours as needed for fever or moderate pain.    [provider]  albuterol (VENTOLIN HFA) 108 (90 Base) MCG/ACT inhaler Inhale 1-2 puffs into the lungs every 6 (six) hours as needed for wheezing or shortness of breath. Shane Chambers taking differently: Inhale 2 puffs into the lungs every 6 (six) hours as needed for wheezing or shortness of breath. 05/24/20   Shane Noel, MD  aspirin 81 MG EC tablet Take 1 tablet (81 mg total) by mouth daily with breakfast. 11/29/20   Shane Hockey, MD  atorvastatin (LIPITOR) 80 MG tablet Take 80 mg by mouth daily.    [provider]  budesonide-formoterol (SYMBICORT) 160-4.5 MCG/ACT inhaler Inhale 2 puffs into the lungs in the morning and at bedtime. 05/24/20   Shane Noel, MD  Cholecalciferol (VITAMIN D) 50 MCG (2000 UT) tablet Take 2,000 Units by mouth daily.    [provider]  gabapentin (NEURONTIN) 300 MG capsule Take 900 mg by mouth 2 (two) times daily.    [provider]  insulin aspart protamine- aspart (NOVOLOG MIX 70/30) (70-30) 100 UNIT/ML  injection Inject 15 Units into the skin 2 (two) times daily with a meal.    [provider]  ketotifen (ZADITOR) 0.025 % ophthalmic solution Place 1 drop into both eyes every morning.    [provider]  levothyroxine (SYNTHROID) 75 MCG tablet Take 75 mcg by mouth daily before breakfast.    [provider]  loperamide HCl (IMODIUM) 2 MG/15ML solution Take 4 mg by mouth every 6 (six) hours as needed for diarrhea or loose stools. 65ml as needed    [provider]  losartan (COZAAR) 50 MG tablet Take 50 mg by mouth daily.    [provider]  magnesium oxide (MAG-OX) 400 MG tablet Take 400 mg by mouth daily.    [provider]  metFORMIN (GLUCOPHAGE) 1000 MG tablet Take 1,000 mg by mouth  2 (two) times daily with a meal.    [provider]  metoprolol succinate (TOPROL-XL) 25 MG 24 hr tablet Take 0.5 tablets (12.5 mg total) by mouth 2 (two) times daily. 08/21/20   Verta Ellen., NP  polyethylene glycol (MIRALAX / GLYCOLAX) 17 g packet Take 17 g by mouth daily as needed for moderate constipation.    [provider]  polyethylene glycol-electrolytes (NULYTELY) 420 g solution As directed Shane Chambers not taking: No sig reported 07/09/21   Eloise Harman, DO  potassium chloride SA (KLOR-CON) 20 MEQ tablet Take 1 tablet (20 mEq total) by mouth daily. 08/21/20   Verta Ellen., NP  tamsulosin (FLOMAX) 0.4 MG CAPS capsule Take 0.8 mg by mouth daily.    [provider]  torsemide (DEMADEX) 20 MG tablet Take 3 tablets (60 mg total) by mouth daily. & 20 mg daily as needed for 3 lb weight gain in 24 hours or 5 lbs in one week 07/25/21   Verta Ellen., NP  venlafaxine XR (EFFEXOR-XR) 150 MG 24 hr capsule Take 150 mg by mouth daily with breakfast.    [provider]    Allergies    Ace inhibitors, Other, and Penicillins  Review of Systems   Review of Systems  Constitutional:  Negative for appetite change.   HENT:  Negative for congestion.   Respiratory:  Negative for shortness of breath.   Cardiovascular:  Positive for leg swelling.  Gastrointestinal:  Negative for abdominal pain.  Genitourinary:  Negative for flank pain.  Musculoskeletal:  Negative for back pain.  Skin:  Positive for wound.  Neurological:  Negative for weakness.  Psychiatric/Behavioral:  Negative for confusion.    Physical Exam Updated Vital Signs BP (!) 169/83 (BP Location: Left Arm)   Pulse 76   Temp 97.7 F (36.5 C) (Oral)   Resp 20   Ht 5\' 9"  (1.753 m)   Wt 129.3 kg   SpO2 96%   BMI 42.09 kg/m   Physical Exam Vitals and nursing note reviewed.  Constitutional:      Appearance: Normal appearance.  HENT:     Head: Atraumatic.  Eyes:     Pupils: Pupils are equal, round, and reactive to light.  Cardiovascular:     Rate and Rhythm: Normal rate and regular rhythm.  Abdominal:     Tenderness: Shane is no abdominal tenderness.  Musculoskeletal:        General: Tenderness present.     Right lower leg: Edema present.     Left lower leg: Edema present.  Skin:    General: Skin is warm.     Capillary Refill: Capillary refill takes less than 2 seconds.     Comments: Chronic venous issues of bilateral lower legs.  Worse on right.  Some induration and erythema particularly on right.  No clear abscess but does have a little fluid on the right side.  Neurological:     Mental Status: He is alert and oriented to person, place, and time.         ED Results / Procedures / Treatments   Labs (all labs ordered are listed, but only abnormal results are displayed) Labs Reviewed  BASIC METABOLIC PANEL - Abnormal; Notable for the following components:      Result Value   Glucose, Bld 112 (*)    Calcium 8.3 (*)    All other components within normal limits  CBC WITH DIFFERENTIAL/PLATELET - Abnormal; Notable for the following components:  WBC 15.5 (*)    RBC 4.11 (*)    Hemoglobin 11.4 (*)    HCT 36.7 (*)     Neutro Abs 11.3 (*)    Monocytes Absolute 1.5 (*)    Eosinophils Absolute 0.8 (*)    Abs Immature Granulocytes 0.19 (*)    All other components within normal limits    EKG None  Radiology No results found.  Procedures Procedures   Medications Ordered in ED Medications - No data to display  ED Course  I have reviewed the triage vital signs and the nursing notes.  Pertinent labs & imaging results that were available during my care of the Shane Chambers were reviewed by me and considered in my medical decision making (see chart for details).    MDM Rules/Calculators/A&P                           Shane Chambers with likely cellulitis on the chronic changes of his lower extremities.  White count mildly elevated but appears to be near baseline.  Not having systemic symptoms.  We will treat with doxycycline.  Can follow-up at the nursing home.  Does not appear to need acute admission to the hospital for this. Final Clinical Impression(s) / ED Diagnoses Final diagnoses:  Cellulitis of right lower extremity    Rx / DC Orders ED Discharge Orders          Ordered    doxycycline (VIBRAMYCIN) 100 MG capsule  2 times daily        07/30/21 2255    oxyCODONE-acetaminophen (PERCOCET/ROXICET) 5-325 MG tablet  Every 8 hours PRN        07/30/21 2302             Shane Belling, MD 07/30/21 2355

## 2021-07-30 NOTE — Discharge Instructions (Addendum)
Follow-up with your doctor.  Watch for worsening pain or fevers.  Hopefully antibiotics will help get the infection under control.

## 2021-07-30 NOTE — ED Triage Notes (Signed)
Pt. Arrived via EMS from [facility]. Pt. Has cellulitis with complaints of 10/10 for pain. Pt. Was given toradol at facility. Pt. requested to come to the ED for pain management.

## 2021-07-31 ENCOUNTER — Telehealth: Payer: Self-pay | Admitting: Internal Medicine

## 2021-07-31 DIAGNOSIS — L03115 Cellulitis of right lower limb: Secondary | ICD-10-CM | POA: Diagnosis not present

## 2021-07-31 MED ORDER — OXYCODONE-ACETAMINOPHEN 5-325 MG PO TABS
1.0000 | ORAL_TABLET | ORAL | Status: DC | PRN
  Administered 2021-07-31: 1 via ORAL
  Filled 2021-07-31: qty 1

## 2021-07-31 MED ORDER — DOXYCYCLINE HYCLATE 100 MG PO TABS
100.0000 mg | ORAL_TABLET | Freq: Two times a day (BID) | ORAL | Status: DC
  Administered 2021-07-31: 100 mg via ORAL

## 2021-07-31 NOTE — Telephone Encounter (Signed)
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  Also had high risk features including invasion to a depth of 2 mm and carcinoma 1 mm from polyp margin.  I discussed case further with Dr. Arnoldo Morale.  Can we set patient up for a flexible sigmoidoscopy with me soon as possible.  I will attempt to tattoo distal to the lesion as well as EMR the scar itself.  Diagnosis sigmoid adenocarcinoma.  Patient is a resident at [facility].  I was able to get a hold of him through the nursing staff there and discussed all of these results with him.  He understands and is agreeable.

## 2021-08-01 ENCOUNTER — Encounter: Payer: Self-pay | Admitting: *Deleted

## 2021-08-01 NOTE — Telephone Encounter (Signed)
Pre-op appt and instructions faxed to [facility]

## 2021-08-01 NOTE — Telephone Encounter (Signed)
[facility] and was on hold several minutes waiting for Kelly/Dudley get patient scheduled and no one ever came to the phone. Will call back later

## 2021-08-01 NOTE — Telephone Encounter (Signed)
Kelly called back. She needs procedure date changed to 10/7 at 10:30am. New instructions faxed.

## 2021-08-01 NOTE — Telephone Encounter (Signed)
Spoke with [employer] from [facility]. Patient scheduled for 10/6 at 9:00am. Offered 10/3 but not able to do that day. She needs me to fax instructions with pre-op appt to her at [phone].

## 2021-08-12 NOTE — Patient Instructions (Addendum)
Shane Chambers  08/12/2021     @PREFPERIOPPHARMACY @   Your procedure is scheduled on  08/16/2021.   Report to [name] at  [place] A.M.    Use your inhalers before you come and bring your rescue inhaler with you.   Call this number if you have problems the morning of surgery:  [phone]   Remember:  Follow the diet and prep instructions given to you by the office.    Take these medicines the morning of surgery with A SIP OF WATER         gabapentin, levothyroxine, metoprolol, omeprazole, oxycodone or tramadol (if needed), flomax, effexor.     Do not wear jewelry, make-up or nail polish.  Do not wear lotions, powders, or perfumes, or deodorant.  Do not shave 48 hours prior to surgery.  Men may shave face and neck.  Do not bring valuables to the hospital.  [facility] is not responsible for any belongings or valuables.  Contacts, dentures or bridgework may not be worn into surgery.  Leave your suitcase in the car.  After surgery it may be brought to your room.  For patients admitted to the hospital, discharge time will be determined by your treatment team.  Patients discharged the day of surgery will not be allowed to drive home and must have someone with them for 24 hours.    Special instructions:   DO NOT smoke tobacco or vape for 24 hours before your procedure.  Please read over the following fact sheets that you were given. Anesthesia Post-op Instructions and Care and Recovery After Surgery      Flexible Sigmoidoscopy, Care After This sheet gives you information about how to care for yourself after your procedure. Your health care provider may also give you more specific instructions. If you have problems or questions, contact your health care provider. What can I expect after the procedure? After the procedure, it is common to have: Cramping or pain in your abdomen. Bloating. A small amount of blood with your bowel movements. This may happen if a  sample of tissue was removed for testing (biopsy). Follow these instructions at home: Eating and drinking  Drink enough fluid to keep your urine pale yellow. Follow instructions from your health care provider about eating or drinking restrictions. Resume your normal diet as instructed by your health care provider. Avoid heavy or fried foods that are hard to digest. Activity  If you were given a medicine to help you relax (sedative) during the procedure, it can affect you for several hours. Do not drive or operate machinery until your health care provider says that it is safe. Rest as told by your health care provider. Return to your normal activities as told by your health care provider. Ask your health care provider what activities are safe for you. General instructions Take over-the-counter and prescription medicines only as told by your health care provider. Try walking around when you have cramps or feel bloated. Keep all follow-up visits as told by your health care provider. This is important. Contact a health care provider if: You have pain or cramping in your abdomen that gets worse or is not helped with medicine. You have a small amount of bleeding from your rectum that continues after 24 hours. You have nausea or vomiting. You feel weak or dizzy. You develop a fever. Get help right away if: You pass large blood clots or see a large amount of blood  in the toilet after having a bowel movement. You have severe pain in your abdomen. You have nausea or vomiting for more than 24 hours after the procedure. Summary After the procedure, you may have cramping or pain in your abdomen or you may have bloating. If you had a sample of tissue removed (biopsy), you may have a small amount of blood with your bowel movements. Resume your normal diet as instructed by your health care provider. Avoid heavy or fried foods that are hard to digest. Try walking around when you have cramps or feel  bloated. Get help right away if you pass large blood clots or see a large amount of blood in the toilet after having a bowel movement. This information is not intended to replace advice given to you by your health care provider. Make sure you discuss any questions you have with your health care provider. Document Revised: 10/24/2019 Document Reviewed: 10/24/2019 Elsevier Patient Education  2022 [place] After This sheet gives you information about how to care for yourself after your procedure. Your health care provider may also give you more specific instructions. If you have problems or questions, contact your health care provider. What can I expect after the procedure? After the procedure, it is common to have: Tiredness. Forgetfulness about what happened after the procedure. Impaired judgment for important decisions. Nausea or vomiting. Some difficulty with balance. Follow these instructions at home: For the time period you were told by your health care provider:   Rest as needed. Do not participate in activities where you could fall or become injured. Do not drive or use machinery. Do not drink alcohol. Do not take sleeping pills or medicines that cause drowsiness. Do not make important decisions or sign legal documents. Do not take care of children on your own. Eating and drinking Follow the diet that is recommended by your health care provider. Drink enough fluid to keep your urine pale yellow. If you vomit: Drink water, juice, or soup when you can drink without vomiting. Make sure you have little or no nausea before eating solid foods. General instructions Have a responsible adult stay with you for the time you are told. It is important to have someone help care for you until you are awake and alert. Take over-the-counter and prescription medicines only as told by your health care provider. If you have sleep apnea, surgery and certain  medicines can increase your risk for breathing problems. Follow instructions from your health care provider about wearing your sleep device: Anytime you are sleeping, including during daytime naps. While taking prescription pain medicines, sleeping medicines, or medicines that make you drowsy. Avoid smoking. Keep all follow-up visits as told by your health care provider. This is important. Contact a health care provider if: You keep feeling nauseous or you keep vomiting. You feel light-headed. You are still sleepy or having trouble with balance after 24 hours. You develop a rash. You have a fever. You have redness or swelling around the IV site. Get help right away if: You have trouble breathing. You have new-onset confusion at home. Summary For several hours after your procedure, you may feel tired. You may also be forgetful and have poor judgment. Have a responsible adult stay with you for the time you are told. It is important to have someone help care for you until you are awake and alert. Rest as told. Do not drive or operate machinery. Do not drink alcohol or take sleeping  pills. Get help right away if you have trouble breathing, or if you suddenly become confused. This information is not intended to replace advice given to you by your health care provider. Make sure you discuss any questions you have with your health care provider. Document Revised: 07/12/2020 Document Reviewed: 09/29/2019 Elsevier Patient Education  2022 [employer].

## 2021-08-13 ENCOUNTER — Other Ambulatory Visit: Payer: Self-pay

## 2021-08-13 ENCOUNTER — Encounter (HOSPITAL_COMMUNITY)
Admission: RE | Admit: 2021-08-13 | Discharge: 2021-08-13 | Disposition: A | Discharge status: Home / Self Care | Payer: Medicare Other | Source: Ambulatory Visit | Attending: Internal Medicine | Admitting: Internal Medicine

## 2021-08-13 ENCOUNTER — Encounter (HOSPITAL_COMMUNITY): Payer: Self-pay

## 2021-08-15 ENCOUNTER — Telehealth: Payer: Self-pay | Admitting: *Deleted

## 2021-08-15 ENCOUNTER — Telehealth: Payer: Self-pay

## 2021-08-15 ENCOUNTER — Encounter: Payer: Self-pay | Admitting: *Deleted

## 2021-08-15 NOTE — Telephone Encounter (Signed)
See other phone note, message was left for transporter to call office to reschedule procedure.

## 2021-08-15 NOTE — Telephone Encounter (Signed)
Spoke to [place] (transporter at [facility]), flex sig rescheduled to 08/26/21 at 11:30am. Pre-op nurse will give arrival time. Instructions faxed to [phone]. Endo scheduler informed.

## 2021-08-15 NOTE — Telephone Encounter (Signed)
Flex Sig w/Propofol ASA 3 w/Dr. Abbey Chatters tomorrow has to be rescheduled d/t issue with city water supply.   [facility], informed them procedure cancelled. Lady took message and will have transporter call office to reschedule.

## 2021-08-15 NOTE — Telephone Encounter (Signed)
[facility] and spoke to [employer] (DON).  She was informed that pt's procedure has to be cancelled tomorrow due to water issue.  She requested a faxed letter from our office.  F:  [phone]  Faxed accordingly.  She was made aware that we will call back to reschedule procedure.

## 2021-08-15 NOTE — Telephone Encounter (Signed)
See other note, flex sig was rescheduled.

## 2021-08-21 ENCOUNTER — Encounter (HOSPITAL_COMMUNITY)
Admission: RE | Admit: 2021-08-21 | Discharge: 2021-08-21 | Disposition: A | Discharge status: Home / Self Care | Payer: Medicare Other | Source: Ambulatory Visit | Attending: Internal Medicine | Admitting: Internal Medicine

## 2021-08-21 ENCOUNTER — Other Ambulatory Visit: Payer: Self-pay

## 2021-08-26 ENCOUNTER — Ambulatory Visit (HOSPITAL_COMMUNITY): Payer: Medicare Other | Admitting: Anesthesiology

## 2021-08-26 ENCOUNTER — Telehealth: Payer: Self-pay | Admitting: Internal Medicine

## 2021-08-26 ENCOUNTER — Encounter (HOSPITAL_COMMUNITY)
Admission: RE | Disposition: A | Discharge status: Skilled Nursing Facility | Payer: Self-pay | Source: Ambulatory Visit | Attending: Internal Medicine

## 2021-08-26 ENCOUNTER — Encounter (HOSPITAL_COMMUNITY): Payer: Self-pay

## 2021-08-26 ENCOUNTER — Other Ambulatory Visit: Payer: Self-pay

## 2021-08-26 ENCOUNTER — Ambulatory Visit (HOSPITAL_COMMUNITY)
Admission: RE | Admit: 2021-08-26 | Discharge: 2021-08-26 | Disposition: A | Discharge status: Skilled Nursing Facility | Payer: Medicare Other | Source: Ambulatory Visit | Attending: Internal Medicine | Admitting: Internal Medicine

## 2021-08-26 ENCOUNTER — Telehealth: Payer: Self-pay

## 2021-08-26 DIAGNOSIS — I2510 Atherosclerotic heart disease of native coronary artery without angina pectoris: Secondary | ICD-10-CM | POA: Insufficient documentation

## 2021-08-26 DIAGNOSIS — E1151 Type 2 diabetes mellitus with diabetic peripheral angiopathy without gangrene: Secondary | ICD-10-CM | POA: Insufficient documentation

## 2021-08-26 DIAGNOSIS — I10 Essential (primary) hypertension: Secondary | ICD-10-CM | POA: Diagnosis not present

## 2021-08-26 DIAGNOSIS — I255 Ischemic cardiomyopathy: Secondary | ICD-10-CM | POA: Diagnosis not present

## 2021-08-26 DIAGNOSIS — Z794 Long term (current) use of insulin: Secondary | ICD-10-CM | POA: Diagnosis not present

## 2021-08-26 DIAGNOSIS — C20 Malignant neoplasm of rectum: Secondary | ICD-10-CM

## 2021-08-26 DIAGNOSIS — Z85038 Personal history of other malignant neoplasm of large intestine: Secondary | ICD-10-CM | POA: Diagnosis not present

## 2021-08-26 DIAGNOSIS — C187 Malignant neoplasm of sigmoid colon: Secondary | ICD-10-CM | POA: Diagnosis present

## 2021-08-26 DIAGNOSIS — K648 Other hemorrhoids: Secondary | ICD-10-CM | POA: Diagnosis not present

## 2021-08-26 DIAGNOSIS — K6289 Other specified diseases of anus and rectum: Secondary | ICD-10-CM

## 2021-08-26 DIAGNOSIS — Z880 Allergy status to penicillin: Secondary | ICD-10-CM | POA: Insufficient documentation

## 2021-08-26 DIAGNOSIS — E785 Hyperlipidemia, unspecified: Secondary | ICD-10-CM | POA: Insufficient documentation

## 2021-08-26 DIAGNOSIS — G4730 Sleep apnea, unspecified: Secondary | ICD-10-CM | POA: Diagnosis not present

## 2021-08-26 DIAGNOSIS — Z87891 Personal history of nicotine dependence: Secondary | ICD-10-CM | POA: Insufficient documentation

## 2021-08-26 DIAGNOSIS — Z7982 Long term (current) use of aspirin: Secondary | ICD-10-CM | POA: Diagnosis not present

## 2021-08-26 DIAGNOSIS — Z7984 Long term (current) use of oral hypoglycemic drugs: Secondary | ICD-10-CM | POA: Diagnosis not present

## 2021-08-26 DIAGNOSIS — Z7951 Long term (current) use of inhaled steroids: Secondary | ICD-10-CM | POA: Diagnosis not present

## 2021-08-26 DIAGNOSIS — Z79890 Hormone replacement therapy (postmenopausal): Secondary | ICD-10-CM | POA: Diagnosis not present

## 2021-08-26 DIAGNOSIS — Z951 Presence of aortocoronary bypass graft: Secondary | ICD-10-CM | POA: Insufficient documentation

## 2021-08-26 HISTORY — PX: FLEXIBLE SIGMOIDOSCOPY: SHX5431

## 2021-08-26 LAB — GLUCOSE, CAPILLARY: Glucose-Capillary: 91 mg/dL (ref 70–99)

## 2021-08-26 SURGERY — SIGMOIDOSCOPY, FLEXIBLE
Anesthesia: General

## 2021-08-26 MED ORDER — LACTATED RINGERS IV SOLN: INTRAVENOUS | Status: DC

## 2021-08-26 MED ORDER — PROPOFOL 10 MG/ML IV BOLUS
INTRAVENOUS | Status: DC | PRN
  Administered 2021-08-26: 20 mg via INTRAVENOUS
  Administered 2021-08-26: 50 mg via INTRAVENOUS
  Administered 2021-08-26: 30 mg via INTRAVENOUS
  Administered 2021-08-26: 100 mg via INTRAVENOUS

## 2021-08-26 MED ORDER — LIDOCAINE HCL (CARDIAC) PF 50 MG/5ML IV SOSY
PREFILLED_SYRINGE | INTRAVENOUS | Status: DC | PRN
  Administered 2021-08-26: 60 mg via INTRAVENOUS

## 2021-08-26 NOTE — Telephone Encounter (Signed)
Tammy at [facility] called office. Pt needs to be referred to colorectal surgeon in [facility] and oncology.  Referral sent to oncology via Epic. Referral sent to [facility] via [place]. Diagnosis-rectal adenocarcinoma.

## 2021-08-26 NOTE — Transfer of Care (Signed)
Immediate Anesthesia Transfer of Care Note  Patient: Shane Chambers  Procedure(s) Performed: FLEXIBLE SIGMOIDOSCOPY  Patient Location: Short Stay  Anesthesia Type:General  Level of Consciousness: awake  Airway & Oxygen Therapy: Patient Spontanous Breathing and Patient connected to nasal cannula oxygen  Post-op Assessment: Report given to RN and Post -op Vital signs reviewed and stable  Post vital signs: Reviewed and stable  Last Vitals:  Vitals Value Taken Time  BP 141/70 08/26/21 1139  Temp 36.5 C 08/26/21 1139  Pulse    Resp 14 08/26/21 1139  SpO2 100 % 08/26/21 1139    Last Pain:  Vitals:   08/26/21 1139  TempSrc: Oral  PainSc: 0-No pain      Patients Stated Pain Goal: 5 (40/97/35 3299)  Complications: No notable events documented.

## 2021-08-26 NOTE — Anesthesia Preprocedure Evaluation (Addendum)
Anesthesia Evaluation  Patient identified by MRN, date of birth, ID band Patient awake    Reviewed: Allergy & Precautions, NPO status , Patient's Chart, lab work & pertinent test results, reviewed documented beta blocker date and time   Airway Mallampati: II  TM Distance: >3 FB Neck ROM: Full    Dental  (+) Dental Advisory Given, Chipped, Missing   Pulmonary sleep apnea , pneumonia, COPD,  COPD inhaler, former smoker,    Pulmonary exam normal breath sounds clear to auscultation       Cardiovascular Exercise Tolerance: Poor hypertension, Pt. on home beta blockers and Pt. on medications + CAD, + CABG, + Peripheral Vascular Disease and +CHF  Normal cardiovascular exam Rhythm:Regular Rate:Normal     Neuro/Psych PSYCHIATRIC DISORDERS Depression  Neuromuscular disease    GI/Hepatic negative GI ROS, Neg liver ROS,   Endo/Other  diabetes, Well Controlled, Type 2, Oral Hypoglycemic AgentsMorbid obesity  Renal/GU      Musculoskeletal negative musculoskeletal ROS (+)   Abdominal   Peds  Hematology  (+) anemia ,   Anesthesia Other Findings   Reproductive/Obstetrics negative OB ROS                            Anesthesia Physical Anesthesia Plan  ASA: 3  Anesthesia Plan: General   Post-op Pain Management:    Induction: Intravenous  PONV Risk Score and Plan: TIVA  Airway Management Planned: Nasal Cannula and Natural Airway  Additional Equipment:   Intra-op Plan:   Post-operative Plan:   Informed Consent: I have reviewed the patients History and Physical, chart, labs and discussed the procedure including the risks, benefits and alternatives for the proposed anesthesia with the patient or authorized representative who has indicated his/her understanding and acceptance.     Dental advisory given  Plan Discussed with: CRNA and Surgeon  Anesthesia Plan Comments:         Anesthesia  Quick Evaluation

## 2021-08-26 NOTE — Op Note (Signed)
[facility] Patient Name: Shane Chambers Procedure Date: 08/26/2021 11:02 AM MRN: [id number] Date of Birth: 08-28-1961 Attending MD: [name]. Abbey Chatters DO CSN: [id number] Age: 60 Admit Type: Outpatient Procedure:                Flexible Sigmoidoscopy Indications:              High risk colon cancer surveillance: Personal                            history of colon cancer Providers:                [name]. Abbey Chatters, DO, Caprice Kluver, Janeece Riggers, RN,                            Kristine L. Risa Grill, Technician Referring MD:              Medicines:                See the Anesthesia note for documentation of the                            administered medications Complications:            No immediate complications. Estimated Blood Loss:     Estimated blood loss: none. Procedure:                Pre-Anesthesia Assessment:                           - The anesthesia plan was to use monitored                            anesthesia care (MAC).                           After obtaining informed consent, the scope was                            passed under direct vision. The PCF-HQ190L                            ([id number]) scope was introduced through the anus and                            advanced to the the descending colon. The flexible                            sigmoidoscopy was accomplished without difficulty.                            The patient tolerated the procedure well. The                            quality of the bowel preparation was good. Scope In: 11:22:51 AM Scope Out: 11:33:43 AM Total Procedure Duration: 0 hours 10 minutes 52 seconds  Findings:      The perianal and digital rectal examinations  were normal.      Non-bleeding internal hemorrhoids were found during endoscopy.      A 15 mm ulcer from previous polypectomy was found in the rectum. There       was no evidence of the previous polyp. This is in the rectum approx 5-8       cm from the anal  verge. Impression:               - Non-bleeding internal hemorrhoids.                           - Scar in the rectum.                           - No specimens collected. Moderate Sedation:      Per Anesthesia Care Recommendation:           - Patient has a contact number available for                            emergencies. The signs and symptoms of potential                            delayed complications were discussed with the                            patient. Return to normal activities tomorrow.                            Written discharge instructions were provided to the                            patient.                           - Resume regular diet.                           - Discussed case with general surgery. Recommends                            referral to colorectal surgery. Will also refer to                            onocology. Patient's inital polyp removed with high                            risk features including invasion to a depth of 26m                            and margins <161m Procedure Code(s):        --- Professional ---                           [phone], flexible; diagnostic,  including collection of specimen(s) by brushing or                            washing, when performed (separate procedure) Diagnosis Code(s):        --- Professional ---                           K06.349, Personal history of other malignant                            neoplasm of large intestine                           K62.89, Other specified diseases of anus and rectum                           K64.8, Other hemorrhoids CPT copyright 2019 [employer]. All rights reserved. The codes documented in this report are preliminary and upon coder review may  be revised to meet current compliance requirements. [name]. Abbey Chatters, DO [place] Abbey Chatters, DO 08/26/2021 11:41:55 AM This report has been signed electronically. Number  of Addenda: 0

## 2021-08-26 NOTE — Telephone Encounter (Signed)
See other phone note, referrals have been sent.

## 2021-08-26 NOTE — Anesthesia Postprocedure Evaluation (Signed)
Anesthesia Post Note  Patient: Shane Chambers  Procedure(s) Performed: [place]  Patient location during evaluation: Phase II Anesthesia Type: General Level of consciousness: awake and alert Pain management: pain level controlled Vital Signs Assessment: post-procedure vital signs reviewed and stable Respiratory status: spontaneous breathing, nonlabored ventilation and respiratory function stable Cardiovascular status: blood pressure returned to baseline and stable Postop Assessment: no apparent nausea or vomiting Anesthetic complications: no   No notable events documented.   Last Vitals:  Vitals:   08/26/21 1050 08/26/21 1139  BP: (!) 154/71 (!) 141/70  Pulse: 72   Resp: 17 14  Temp: (!) 36.4 C 36.5 C  SpO2: 98% 100%    Last Pain:  Vitals:   08/26/21 1139  TempSrc: Oral  PainSc: 0-No pain                 Mashonda Broski C Jarrett Albor

## 2021-08-26 NOTE — Discharge Instructions (Addendum)
  Colonoscopy Discharge Instructions  Read the instructions outlined below and refer to this sheet in the next few weeks. These discharge instructions provide you with general information on caring for yourself after you leave the hospital. Your doctor may also give you specific instructions. While your treatment has been planned according to the most current medical practices available, unavoidable complications occasionally occur.   ACTIVITY You may resume your regular activity, but move at a slower pace for the next 24 hours.  Take frequent rest periods for the next 24 hours.  Walking will help get rid of the air and reduce the bloated feeling in your belly (abdomen).  No driving for 24 hours (because of the medicine (anesthesia) used during the test).   Do not sign any important legal documents or operate any machinery for 24 hours (because of the anesthesia used during the test).  NUTRITION Drink plenty of fluids.  You may resume your normal diet as instructed by your doctor.  Begin with a light meal and progress to your normal diet. Heavy or fried foods are harder to digest and may make you feel sick to your stomach (nauseated).  Avoid alcoholic beverages for 24 hours or as instructed.  MEDICATIONS You may resume your normal medications unless your doctor tells you otherwise.  WHAT YOU CAN EXPECT TODAY Some feelings of bloating in the abdomen.  Passage of more gas than usual.  Spotting of blood in your stool or on the toilet paper.  IF YOU HAD POLYPS REMOVED DURING THE COLONOSCOPY: No aspirin products for 7 days or as instructed.  No alcohol for 7 days or as instructed.  Eat a soft diet for the next 24 hours.  FINDING OUT THE RESULTS OF YOUR TEST Not all test results are available during your visit. If your test results are not back during the visit, make an appointment with your caregiver to find out the results. Do not assume everything is normal if you have not heard from your  caregiver or the medical facility. It is important for you to follow up on all of your test results.  SEEK IMMEDIATE MEDICAL ATTENTION IF: You have more than a spotting of blood in your stool.  Your belly is swollen (abdominal distention).  You are nauseated or vomiting.  You have a temperature over 101.  You have abdominal pain or discomfort that is severe or gets worse throughout the day.   I was able to locate the scar from previous polypectomy.  It is actually much more distal than I originally thought.  Technically it is located in your rectum.  This complicates things from a potential surgical perspective.  I have discussed case with our surgeons here locally who recommended referring you to colorectal surgery in [facility] which we will make today.  I will also refer you to oncology to further discuss treatment options.  I hope you have a great rest of your week!  Shane Chambers. Abbey Chatters, D.O. Gastroenterology and Hepatology [facility]

## 2021-08-26 NOTE — Anesthesia Procedure Notes (Signed)
Date/Time: 08/26/2021 11:17 AM Performed by: Vista Deck, CRNA Pre-anesthesia Checklist: Patient identified, Emergency Drugs available, Suction available, Timeout performed and Patient being monitored Patient Re-evaluated:Patient Re-evaluated prior to induction Oxygen Delivery Method: Nasal Cannula

## 2021-08-26 NOTE — H&P (Signed)
Primary Care Physician:  System, Provider Not In Primary Gastroenterologist:  Dr. Abbey Chatters  Pre-Procedure History & Physical: HPI:  Shane Chambers is a 60 y.o. male is here for a flexible sigmoidoscopy for recent sigmoid polyp removed, pathology positive for adenocarcinoma.   Past Medical History:  Diagnosis Date   CAD (coronary artery disease)    a. s/p CABG in 03/2020 with LIMA-LAD, RIMA-PL, RA-D1-RI-OM1   Cellulitis    Depression    Diabetes mellitus without complication ([place])    Hyperlipidemia 12/01/2019   Hypertension    Ischemic cardiomyopathy    a. EF 20-25% by echo in 02/2020 b. at 40% by echo in 03/2020 c. EF normalized to 60-65% by echo in 04/2020   Peripheral vascular disease ([place])    Sleep apnea    Type 2 diabetes mellitus ([place])     Past Surgical History:  Procedure Laterality Date   BIOPSY  07/18/2021   Procedure: BIOPSY;  Surgeon: Eloise Harman, DO;  [facility];  Service: Endoscopy;;   COLONOSCOPY WITH PROPOFOL N/A 07/18/2021   Procedure: COLONOSCOPY WITH PROPOFOL;  Surgeon: Eloise Harman, DO;  [facility];  Service: Endoscopy;  Laterality: N/A;  8:00am   CORONARY ARTERY BYPASS GRAFT N/A 03/13/2020   Procedure: CORONARY ARTERY BYPASS GRAFTING (CABG) times five using bilateral Internal mammary arteries and left radial artery;  Surgeon: [name], MD;  Location: [place];  Service: Open Heart Surgery;  Laterality: N/A;   DENTAL SURGERY     ESOPHAGOGASTRODUODENOSCOPY (EGD) WITH PROPOFOL N/A 07/18/2021   Procedure: ESOPHAGOGASTRODUODENOSCOPY (EGD) WITH PROPOFOL;  Surgeon: Eloise Harman, DO;  [facility];  Service: Endoscopy;  Laterality: N/A;   POLYPECTOMY  07/18/2021   Procedure: POLYPECTOMY INTESTINAL;  Surgeon: Eloise Harman, DO;  [facility];  Service: Endoscopy;;   RADIAL ARTERY HARVEST Left 03/13/2020   Procedure: [place],;  Surgeon: [name], MD;  Location: [place];  Service: Open Heart  Surgery;  Laterality: Left;   RIGHT/LEFT HEART CATH AND CORONARY ANGIOGRAPHY N/A 03/07/2020   Procedure: RIGHT/LEFT HEART CATH AND CORONARY ANGIOGRAPHY;  Surgeon: Sherren Mocha, MD;  Location: [place] [facility];  Service: Cardiovascular;  Laterality: N/A;   TEE WITHOUT CARDIOVERSION N/A 03/13/2020   Procedure: TRANSESOPHAGEAL ECHOCARDIOGRAM (TEE);  Surgeon: [name], MD;  Location: [place];  Service: Open Heart Surgery;  Laterality: N/A;    Prior to Admission medications   Medication Sig Start Date End Date Taking? Authorizing Provider  acetaminophen (TYLENOL) 500 MG tablet Take 1,000 mg by mouth every 8 (eight) hours as needed for fever or moderate pain.   Yes [provider]  albuterol (VENTOLIN HFA) 108 (90 Base) MCG/ACT inhaler Inhale 1-2 puffs into the lungs every 6 (six) hours as needed for wheezing or shortness of breath. 05/24/20  Yes Rigoberto Noel, MD  aspirin 81 MG EC tablet Take 1 tablet (81 mg total) by mouth daily with breakfast. 11/29/20  Yes Emokpae, Courage, MD  atorvastatin (LIPITOR) 80 MG tablet Take 80 mg by mouth daily with supper.   Yes [provider]  budesonide-formoterol (SYMBICORT) 160-4.5 MCG/ACT inhaler Inhale 2 puffs into the lungs in the morning and at bedtime. 05/24/20  Yes Rigoberto Noel, MD  Cholecalciferol (VITAMIN D) 50 MCG (2000 UT) tablet Take 2,000 Units by mouth daily.   Yes [provider]  gabapentin (NEURONTIN) 300 MG capsule Take 900 mg by mouth 2 (two) times daily.  Yes [provider]  insulin aspart protamine- aspart (NOVOLOG MIX 70/30) (70-30) 100 UNIT/ML injection Inject 15 Units into the skin 2 (two) times daily with a meal.   Yes [provider]  ketotifen (ZADITOR) 0.025 % ophthalmic solution Place 1 drop into both eyes every morning.   Yes [provider]  levothyroxine (SYNTHROID) 75 MCG tablet Take 75 mcg by mouth daily before breakfast.   Yes [provider]  losartan  (COZAAR) 50 MG tablet Take 50 mg by mouth daily.   Yes [provider]  magnesium oxide (MAG-OX) 400 MG tablet Take 400 mg by mouth daily.   Yes [provider]  metFORMIN (GLUCOPHAGE) 1000 MG tablet Take 1,000 mg by mouth 2 (two) times daily with a meal.   Yes [provider]  metoprolol succinate (TOPROL-XL) 25 MG 24 hr tablet Take 0.5 tablets (12.5 mg total) by mouth 2 (two) times daily. Patient taking differently: Take 25 mg by mouth 2 (two) times daily. 08/21/20  Yes Verta Ellen., NP  omeprazole (PRILOSEC) 20 MG capsule Take 20 mg by mouth daily before breakfast.   Yes [provider]  oxyCODONE-acetaminophen (PERCOCET/ROXICET) 5-325 MG tablet Take 1-2 tablets by mouth every 8 (eight) hours as needed for severe pain. Patient taking differently: Take 1-2 tablets by mouth every 8 (eight) hours as needed (1 tablet for pain 1-5. 2 tablets for pain 6-10.). 07/30/21  Yes Davonna Belling, MD  polyethylene glycol (MIRALAX / GLYCOLAX) 17 g packet Take 17 g by mouth daily as needed for moderate constipation.   Yes [provider]  potassium chloride SA (KLOR-CON) 20 MEQ tablet Take 1 tablet (20 mEq total) by mouth daily. 08/21/20  Yes Verta Ellen., NP  tamsulosin (FLOMAX) 0.4 MG CAPS capsule Take 0.8 mg by mouth daily.   Yes [provider]  torsemide (DEMADEX) 20 MG tablet Take 3 tablets (60 mg total) by mouth daily. & 20 mg daily as needed for 3 lb weight gain in 24 hours or 5 lbs in one week Patient taking differently: Take 20-60 mg by mouth See admin instructions. Take 60mg  daily & 20 mg daily as needed for 3 lb weight gain in 24 hours or 5 lbs in one week 07/25/21  Yes Verta Ellen., NP  traMADol (ULTRAM) 50 MG tablet Take 50 mg by mouth every 6 (six) hours as needed (pain).   Yes [provider]  venlafaxine XR (EFFEXOR-XR) 150 MG 24 hr capsule Take 150 mg by mouth daily with breakfast.   Yes [provider]   doxycycline (VIBRAMYCIN) 100 MG capsule Take 1 capsule (100 mg total) by mouth 2 (two) times daily. 07/30/21   Davonna Belling, MD  loperamide HCl (IMODIUM) 2 MG/15ML solution Take 4 mg by mouth every 6 (six) hours as needed for diarrhea or loose stools. 1ml as needed    [provider]    Allergies as of 08/01/2021 - Review Complete 07/30/2021  Allergen Reaction Noted   Ace inhibitors Swelling and Cough 12/11/2017   Other Itching 11/15/2019   Penicillins  12/11/2017    Family History  Problem Relation Age of Onset   Hypertension Mother     Social History   Socioeconomic History   Marital status: Single    Spouse name: Not on file   Number of children: Not on file   Years of education: Not on file   Highest education level: Not on file  Occupational History  Not on file  Tobacco Use   Smoking status: Former    Packs/day: 1.50    Types: Cigarettes    Quit date: 05/25/1995    Years since quitting: 26.2   Smokeless tobacco: Never  Vaping Use   Vaping Use: Never used  Substance and Sexual Activity   Alcohol use: Yes    Comment: rarely   Drug use: No   Sexual activity: Not on file  Other Topics Concern   Not on file  Social History Narrative   Not on file   Social Determinants of Health   Financial Resource Strain: Not on file  Food Insecurity: Not on file  Transportation Needs: Not on file  Physical Activity: Not on file  Stress: Not on file  Social Connections: Not on file  Intimate Partner Violence: Not on file    Review of Systems: See HPI, otherwise negative ROS  Physical Exam: Vital signs in last 24 hours: Temp:  [97.5 F (36.4 C)] 97.5 F (36.4 C) (10/17 1050) Pulse Rate:  [72] 72 (10/17 1050) Resp:  [17] 17 (10/17 1050) BP: (154)/(71) 154/71 (10/17 1050) SpO2:  [98 %] 98 % (10/17 1050)   General:   Alert,  Well-developed, well-nourished, pleasant and cooperative in NAD Head:  Normocephalic and atraumatic. Eyes:  Sclera clear, no  icterus.   Conjunctiva pink. Ears:  Normal auditory acuity. Nose:  No deformity, discharge,  or lesions. Mouth:  No deformity or lesions, dentition normal. Neck:  Supple; no masses or thyromegaly. Lungs:  Clear throughout to auscultation.   No wheezes, crackles, or rhonchi. No acute distress. Heart:  Regular rate and rhythm; no murmurs, clicks, rubs,  or gallops. Abdomen:  Soft, nontender and nondistended. No masses, hepatosplenomegaly or hernias noted. Normal bowel sounds, without guarding, and without rebound.   Msk:  Symmetrical without gross deformities. Normal posture. Extremities:  Without clubbing or edema. Neurologic:  Alert and  oriented x4;  grossly normal neurologically. Skin:  Intact without significant lesions or rashes. Cervical Nodes:  No significant cervical adenopathy. Psych:  Alert and cooperative. Normal mood and affect.  Impression/Plan: Shane Chambers is here for a flexible sigmoidoscopy for recent sigmoid polyp removed, pathology positive for adenocarcinoma.   The risks of the procedure including infection, bleed, or perforation as well as benefits, limitations, alternatives and imponderables have been reviewed with the patient. Questions have been answered. All parties agreeable.

## 2021-08-26 NOTE — Telephone Encounter (Signed)
Can we please refer this patient to colorectal surgery in [facility] as well as Dr. Raliegh Ip with oncology, diagnosis rectal adenocarcinoma.  Please send colonoscopy report, flexible sigmoidoscopy report, path results to colorectal surgery with referral.

## 2021-08-29 ENCOUNTER — Encounter (HOSPITAL_COMMUNITY): Payer: Self-pay | Admitting: Internal Medicine

## 2021-09-02 NOTE — Progress Notes (Signed)
Shane Chambers [address] [place] [address]   CLINIC:  Medical Oncology/Hematology  CONSULT NOTE  Patient Care Team: System, Provider Not In as PCP - General Branch, Shane Guild, MD as PCP - Cardiology (Cardiology) Eloise Harman, DO as Consulting Physician (Gastroenterology) Shane Jack, MD as Medical Oncologist (Medical Oncology) Shane Mates, RN as Oncology Nurse Navigator (Oncology)  CHIEF COMPLAINTS/PURPOSE OF CONSULTATION:  Evaluation of rectal adenocarcinoma  HISTORY OF PRESENTING ILLNESS:  [name] 60 y.o. male is here because of rectal adenocarcinoma, at the request of Shane Chambers.  Today he reports feeling well. He spends most of his day sitting down. He has had cellulitis on his legs bilaterally for over a year. He reports intermittent constipation and diarrhea. He reports occasional hematochezia for which a colonoscopy was done on 07/18/2021. He reports a history of HTN, hypothyroidism, hyperlipidemia, and DM. He reports weakness and neuropathy in his legs as well as dizziness. He is able to walk a few steps with the assistance of a walker, but he mostly utilizes the wheelchair to move. He reports occasional SOB upon exertion. He has lived at [facility] in [place] since January 2022. Prior to hospitalization for cellulitis on his lower extremities on 11/22/2020, he lived at home. 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 He quit smoking in 1996. He had a maternal great aunt with breast cancer.   MEDICAL HISTORY:  Past Medical History:  Diagnosis Date   CAD (coronary artery disease)    a. s/p CABG in 03/2020 with LIMA-LAD, RIMA-PL, [id number]   Cellulitis    Depression    Diabetes mellitus without complication ([place])    Hyperlipidemia 12/01/2019   Hypertension    Ischemic cardiomyopathy    a. EF 20-25% by echo in 02/2020 b. at 40% by echo in 03/2020 c. EF normalized to 60-65% by echo in 04/2020   Peripheral vascular  disease ([place])    Sleep apnea    Type 2 diabetes mellitus ([place])     SURGICAL HISTORY: Past Surgical History:  Procedure Laterality Date   BIOPSY  07/18/2021   Procedure: BIOPSY;  Surgeon: Eloise Harman, DO;  [facility];  Service: Endoscopy;;   COLONOSCOPY WITH PROPOFOL N/A 07/18/2021   Procedure: COLONOSCOPY WITH PROPOFOL;  Surgeon: Eloise Harman, DO;  [facility];  Service: Endoscopy;  Laterality: N/A;  8:00am   CORONARY ARTERY BYPASS GRAFT N/A 03/13/2020   Procedure: CORONARY ARTERY BYPASS GRAFTING (CABG) times five using bilateral Internal mammary arteries and left radial artery;  Surgeon: [name], MD;  Location: [facility];  Service: Open Heart Surgery;  Laterality: N/A;   DENTAL SURGERY     ESOPHAGOGASTRODUODENOSCOPY (EGD) WITH PROPOFOL N/A 07/18/2021   Procedure: ESOPHAGOGASTRODUODENOSCOPY (EGD) WITH PROPOFOL;  Surgeon: Eloise Harman, DO;  [facility];  Service: Endoscopy;  Laterality: N/A;   FLEXIBLE SIGMOIDOSCOPY N/A 08/26/2021   Procedure: FLEXIBLE SIGMOIDOSCOPY;  Surgeon: Eloise Harman, DO;  [facility];  Service: Endoscopy;  Laterality: N/A;  9:00am   POLYPECTOMY  07/18/2021   Procedure: POLYPECTOMY INTESTINAL;  Surgeon: Eloise Harman, DO;  [facility];  Service: Endoscopy;;   RADIAL ARTERY HARVEST Left 03/13/2020   Procedure: [place],;  Surgeon: [name], MD;  Location: [facility];  Service: Open Heart Surgery;  Laterality: Left;   RIGHT/LEFT HEART CATH AND CORONARY ANGIOGRAPHY N/A 03/07/2020   Procedure: RIGHT/LEFT HEART CATH AND  CORONARY ANGIOGRAPHY;  Surgeon: Sherren Mocha, MD;  Location: [facility];  Service: Cardiovascular;  Laterality: N/A;   TEE WITHOUT CARDIOVERSION N/A 03/13/2020   Procedure: TRANSESOPHAGEAL ECHOCARDIOGRAM (TEE);  Surgeon: [name], MD;  Location: [facility];  Service: Open Heart Surgery;  Laterality: N/A;    SOCIAL HISTORY: Social History    Socioeconomic History   Marital status: Single    Spouse name: Not on file   Number of children: Not on file   Years of education: Not on file   Highest education level: Not on file  Occupational History   Not on file  Tobacco Use   Smoking status: Former    Packs/day: 1.50    Types: Cigarettes    Quit date: 05/25/1995    Years since quitting: 26.2   Smokeless tobacco: Never  Vaping Use   Vaping Use: Never used  Substance and Sexual Activity   Alcohol use: Yes    Comment: rarely   Drug use: No   Sexual activity: Not on file  Other Topics Concern   Not on file  Social History Narrative   Not on file   Social Determinants of Health   Financial Resource Strain: Not on file  Food Insecurity: Not on file  Transportation Needs: Not on file  Physical Activity: Not on file  Stress: Not on file  Social Connections: Not on file  Intimate Partner Violence: Not on file    FAMILY HISTORY: Family History  Problem Relation Age of Onset   Hypertension Mother     ALLERGIES:  is allergic to ace inhibitors, other, and penicillins.  MEDICATIONS:  Current Outpatient Medications  Medication Sig Dispense Refill   aspirin 81 MG EC tablet Take 1 tablet (81 mg total) by mouth daily with breakfast. 30 tablet 12   atorvastatin (LIPITOR) 80 MG tablet Take 80 mg by mouth daily with supper.     budesonide-formoterol (SYMBICORT) 160-4.5 MCG/ACT inhaler Inhale 2 puffs into the lungs in the morning and at bedtime. 1 Inhaler 6   Cholecalciferol (VITAMIN D) 50 MCG (2000 UT) tablet Take 2,000 Units by mouth daily.     doxycycline (VIBRAMYCIN) 100 MG capsule Take 1 capsule (100 mg total) by mouth 2 (two) times daily. 20 capsule 0   gabapentin (NEURONTIN) 300 MG capsule Take 900 mg by mouth 2 (two) times daily.     insulin aspart protamine- aspart (NOVOLOG MIX 70/30) (70-30) 100 UNIT/ML injection Inject 15 Units into the skin 2 (two) times daily with a meal.     ketotifen (ZADITOR) 0.025 %  ophthalmic solution Place 1 drop into both eyes every morning.     levothyroxine (SYNTHROID) 75 MCG tablet Take 75 mcg by mouth daily before breakfast.     losartan (COZAAR) 50 MG tablet Take 50 mg by mouth daily.     magnesium oxide (MAG-OX) 400 MG tablet Take 400 mg by mouth daily.     metFORMIN (GLUCOPHAGE) 1000 MG tablet Take 1,000 mg by mouth 2 (two) times daily with a meal.     metoprolol succinate (TOPROL-XL) 25 MG 24 hr tablet Take 0.5 tablets (12.5 mg total) by mouth 2 (two) times daily. (Patient taking differently: Take 25 mg by mouth 2 (two) times daily.) 90 tablet 1   omeprazole (PRILOSEC) 20 MG capsule Take 20 mg by mouth daily before breakfast.     polyethylene glycol (MIRALAX / GLYCOLAX) 17 g packet Take 17 g by mouth daily as needed for moderate constipation.  potassium chloride SA (KLOR-CON) 20 MEQ tablet Take 1 tablet (20 mEq total) by mouth daily. 90 tablet 1   tamsulosin (FLOMAX) 0.4 MG CAPS capsule Take 0.8 mg by mouth daily.     venlafaxine XR (EFFEXOR-XR) 150 MG 24 hr capsule Take 150 mg by mouth daily with breakfast.     acetaminophen (TYLENOL) 500 MG tablet Take 1,000 mg by mouth every 8 (eight) hours as needed for fever or moderate pain. (Patient not taking: Reported on 09/03/2021)     albuterol (VENTOLIN HFA) 108 (90 Base) MCG/ACT inhaler Inhale 1-2 puffs into the lungs every 6 (six) hours as needed for wheezing or shortness of breath. (Patient not taking: Reported on 09/03/2021) 18 g 3   ALPRAZolam (XANAX) 0.5 MG tablet Take 1 tablet (0.5 mg total) by mouth once for 1 dose. Take 1 hour prior to MRI 1 tablet 0   loperamide HCl (IMODIUM) 2 MG/15ML solution Take 4 mg by mouth every 6 (six) hours as needed for diarrhea or loose stools. 49m as needed (Patient not taking: Reported on 09/03/2021)     oxyCODONE-acetaminophen (PERCOCET/ROXICET) 5-325 MG tablet Take 1-2 tablets by mouth every 8 (eight) hours as needed for severe pain. (Patient not taking: Reported on 09/03/2021)  6 tablet 0   torsemide (DEMADEX) 20 MG tablet Take 3 tablets (60 mg total) by mouth daily. & 20 mg daily as needed for 3 lb weight gain in 24 hours or 5 lbs in one week (Patient not taking: Reported on 09/03/2021) 90 tablet 6   traMADol (ULTRAM) 50 MG tablet Take 50 mg by mouth every 6 (six) hours as needed (pain). (Patient not taking: Reported on 09/03/2021)     No current facility-administered medications for this visit.    REVIEW OF SYSTEMS:   Review of Systems  Constitutional:  Negative for appetite change (60%) and fatigue (50%).  HENT:   Positive for trouble swallowing (pills).   Respiratory:  Positive for cough and shortness of breath.   Cardiovascular:  Positive for palpitations.  Gastrointestinal:  Positive for blood in stool, constipation and diarrhea.  Musculoskeletal:  Positive for myalgias (6/10 legs).  Neurological:  Positive for dizziness, extremity weakness (legs), headaches and numbness (neuropathy).  Psychiatric/Behavioral:  Positive for depression. The patient is nervous/anxious.   All other systems reviewed and are negative.   PHYSICAL EXAMINATION: ECOG PERFORMANCE STATUS: 2 - Symptomatic, <50% confined to bed  Vitals:   09/03/21 1329  BP: (!) 150/80  Pulse: 68  Resp: 18  Temp: 98.8 F (37.1 C)  SpO2: 98%   There were no vitals filed for this visit. Physical Exam Vitals reviewed.  Constitutional:      Appearance: Normal appearance.     Comments: In wheelchair  Cardiovascular:     Rate and Rhythm: Normal rate and regular rhythm.     Pulses: Normal pulses.     Heart sounds: Normal heart sounds.  Pulmonary:     Effort: Pulmonary effort is normal.     Breath sounds: Normal breath sounds.  Neurological:     General: No focal deficit present.     Mental Status: He is alert and oriented to person, place, and time.  Psychiatric:        Mood and Affect: Mood normal.        Behavior: Behavior normal.     LABORATORY DATA:  I have reviewed the data as  listed CBC Latest Ref Rng & Units 09/03/2021 07/30/2021 07/16/2021  WBC 4.0 - 10.5 K/uL 11.6(H)  15.5(H) 13.4(H)  Hemoglobin 13.0 - 17.0 g/dL 11.7(L) 11.4(L) 12.1(L)  Hematocrit 39.0 - 52.0 % 37.7(L) 36.7(L) 38.8(L)  Platelets 150 - 400 K/uL 301 386 329   CMP Latest Ref Rng & Units 09/03/2021 07/30/2021 07/16/2021  Glucose 70 - 99 mg/dL 79 112(H) 98  BUN 6 - 20 mg/dL 23(H) 20 22(H)  Creatinine 0.61 - 1.24 mg/dL 1.09 1.18 0.96  Sodium 135 - 145 mmol/L 138 136 138  Potassium 3.5 - 5.1 mmol/L 4.0 4.0 4.0  Chloride 98 - 111 mmol/L 99 100 101  CO2 22 - 32 mmol/L 30 27 26   Calcium 8.9 - 10.3 mg/dL 9.0 8.3(L) 9.1  Total Protein 6.5 - 8.1 g/dL 7.5 - -  Total Bilirubin 0.3 - 1.2 mg/dL 0.4 - -  Alkaline Phos 38 - 126 U/L 119 - -  AST 15 - 41 U/L 22 - -  ALT 0 - 44 U/L 14 - -    RADIOGRAPHIC STUDIES: I have personally reviewed the radiological images as listed and agreed with the findings in the report. No results found.  ASSESSMENT:  Rectal adenocarcinoma arising in a polyp: - Colonoscopy on 07/18/2021 with 15 mm sessile polyp in the sigmoid colon removed with hot snare, 5 mm sessile polyp in the sigmoid colon removed. - Pathology of the sigmoid polypectomy shows invasive colonic adenocarcinoma involving a tubular adenoma.  Carcinoma invades to a depth of 2 mm.  Carcinoma is 1 mm from the margin.  No lymphovascular invasion.  No poorly differentiated component. - Flexible sigmoidoscopy on 08/26/2021 with 15 mm ulcer from previous polypectomy found in the rectum.  This is in the rectum approximately 5 to 8 cm from the anal verge.  Social/family history: - He is a retired Marine scientist.  He works for [employer]. - He quit smoking in 1996. - He is currently residing at [facility] in [place] since January 2022.  He was originally admitted there for rehab after cellulitis.  He reports that he will soon be discharged to a community apartment. - Maternal great aunt had breast cancer.  No other  malignancies.   PLAN:  Rectal adenocarcinoma and a polyp: - Based on gross description of the pathology, it appears to be a fragmented specimen. - Recommend MRI of the pelvis rectal cancer protocol.  Also recommend CT of the chest and abdomen with contrast. - Based on the findings, will consider transanal local excision or a transabdominal resection. - We will also send a CEA level.  We will check for MSI/MMR on pathology. - RTC after the scans.  2.  Normocytic anemia: - Will check for ferritin, iron panel along with CBC and comprehensive metabolic panel.   All questions were answered. The patient knows to call the clinic with any problems, questions or concerns.   Shane Jack, MD, 09/03/21 6:31 PM  [place] [phone]   I, Thana Ates, am acting as a scribe for Dr. Derek Chambers.  I, Shane Jack MD, have reviewed the above documentation for accuracy and completeness, and I agree with the above.

## 2021-09-03 ENCOUNTER — Other Ambulatory Visit (HOSPITAL_COMMUNITY): Payer: Self-pay

## 2021-09-03 ENCOUNTER — Other Ambulatory Visit: Payer: Self-pay

## 2021-09-03 ENCOUNTER — Inpatient Hospital Stay (HOSPITAL_COMMUNITY): Payer: Medicare Other | Attending: Hematology | Admitting: Hematology

## 2021-09-03 ENCOUNTER — Inpatient Hospital Stay (HOSPITAL_COMMUNITY): Payer: Medicare Other

## 2021-09-03 DIAGNOSIS — Z803 Family history of malignant neoplasm of breast: Secondary | ICD-10-CM | POA: Insufficient documentation

## 2021-09-03 DIAGNOSIS — C20 Malignant neoplasm of rectum: Secondary | ICD-10-CM | POA: Diagnosis present

## 2021-09-03 DIAGNOSIS — Z79899 Other long term (current) drug therapy: Secondary | ICD-10-CM | POA: Insufficient documentation

## 2021-09-03 DIAGNOSIS — D649 Anemia, unspecified: Secondary | ICD-10-CM | POA: Diagnosis not present

## 2021-09-03 DIAGNOSIS — Z87891 Personal history of nicotine dependence: Secondary | ICD-10-CM | POA: Diagnosis not present

## 2021-09-03 LAB — CBC WITH DIFFERENTIAL/PLATELET
Abs Immature Granulocytes: 0.15 10*3/uL — ABNORMAL HIGH (ref 0.00–0.07)
Basophils Absolute: 0.1 10*3/uL (ref 0.0–0.1)
Basophils Relative: 1 %
Eosinophils Absolute: 0.9 10*3/uL — ABNORMAL HIGH (ref 0.0–0.5)
Eosinophils Relative: 7 %
HCT: 37.7 % — ABNORMAL LOW (ref 39.0–52.0)
Hemoglobin: 11.7 g/dL — ABNORMAL LOW (ref 13.0–17.0)
Immature Granulocytes: 1 %
Lymphocytes Relative: 18 %
Lymphs Abs: 2.1 10*3/uL (ref 0.7–4.0)
MCH: 27.7 pg (ref 26.0–34.0)
MCHC: 31 g/dL (ref 30.0–36.0)
MCV: 89.3 fL (ref 80.0–100.0)
Monocytes Absolute: 1 10*3/uL (ref 0.1–1.0)
Monocytes Relative: 8 %
Neutro Abs: 7.5 10*3/uL (ref 1.7–7.7)
Neutrophils Relative %: 65 %
Platelets: 301 10*3/uL (ref 150–400)
RBC: 4.22 MIL/uL (ref 4.22–5.81)
RDW: 14.3 % (ref 11.5–15.5)
WBC: 11.6 10*3/uL — ABNORMAL HIGH (ref 4.0–10.5)
nRBC: 0 % (ref 0.0–0.2)

## 2021-09-03 LAB — COMPREHENSIVE METABOLIC PANEL
ALT: 14 U/L (ref 0–44)
AST: 22 U/L (ref 15–41)
Albumin: 3.7 g/dL (ref 3.5–5.0)
Alkaline Phosphatase: 119 U/L (ref 38–126)
Anion gap: 9 (ref 5–15)
BUN: 23 mg/dL — ABNORMAL HIGH (ref 6–20)
CO2: 30 mmol/L (ref 22–32)
Calcium: 9 mg/dL (ref 8.9–10.3)
Chloride: 99 mmol/L (ref 98–111)
Creatinine, Ser: 1.09 mg/dL (ref 0.61–1.24)
GFR, Estimated: >60 mL/min (ref >60)
Glucose, Bld: 79 mg/dL (ref 70–99)
Potassium: 4 mmol/L (ref 3.5–5.1)
Sodium: 138 mmol/L (ref 135–145)
Total Bilirubin: 0.4 mg/dL (ref 0.3–1.2)
Total Protein: 7.5 g/dL (ref 6.5–8.1)

## 2021-09-03 LAB — IRON AND TIBC
Iron: 47 ug/dL (ref 45–182)
Saturation Ratios: 12 % — ABNORMAL LOW (ref 17.9–39.5)
TIBC: 383 ug/dL (ref 250–450)
UIBC: 336 ug/dL

## 2021-09-03 LAB — FERRITIN: Ferritin: 30 ng/mL (ref 24–336)

## 2021-09-03 MED ORDER — ALPRAZOLAM 0.5 MG PO TABS: 0.5000 mg | ORAL_TABLET | Freq: Once | ORAL | 0 refills | Status: AC

## 2021-09-03 NOTE — Patient Instructions (Addendum)
[facility] Discharge Instructions  You were seen and examined today by Dr. Delton Coombes. Dr. Delton Coombes is a medical oncologist, meaning that he specializes in cancer diagnoses. Dr. Delton Coombes discussed your past medical history, family history of cancers, and the events that led to you being here today.  You were referred to Dr. Delton Coombes due to your recent abnormal colonoscopy. Your recent colonoscopy revealed an adenocarcinoma, this is a type of rectal cancer. Dr. Delton Coombes has recommended additional lab work that will include a tumor marker for CEA. Dr. Delton Coombes also recommends a CT scan and a MRI.  Follow-up as scheduled.   Thank you for choosing [place] at [facility] to provide your oncology and hematology care.  To afford each patient quality time with our provider, please arrive at least 15 minutes before your scheduled appointment time.   If you have a lab appointment with the [facility] please come in thru the Main Entrance and check in at the main information desk.  You need to re-schedule your appointment should you arrive 10 or more minutes late.  We strive to give you quality time with our providers, and arriving late affects you and other patients whose appointments are after yours.  Also, if you no show three or more times for appointments you may be dismissed from the clinic at the providers discretion.     Again, thank you for choosing [facility].  Our hope is that these requests will decrease the amount of time that you wait before being seen by our physicians.       _____________________________________________________________  Should you have questions after your visit to [facility], please contact our office at [phone] and follow the prompts.  Our office hours are 8:00 a.m. and 4:30 p.m. Monday - Friday.  Please note that voicemails left after 4:00 p.m. may not be returned  until the following business day.  We are closed weekends and major holidays.  You do have access to a nurse 24-7, just call the main number to the clinic [phone] and do not press any options, hold on the line and a nurse will answer the phone.    For prescription refill requests, have your pharmacy contact our office and allow 72 hours.    Due to Covid, you will need to wear a mask upon entering the hospital. If you do not have a mask, a mask will be given to you at the Main Entrance upon arrival. For doctor visits, patients may have 1 support person age 74 or older with them. For treatment visits, patients can not have anyone with them due to social distancing guidelines and our immunocompromised population.

## 2021-09-04 LAB — CEA: CEA: 2 ng/mL (ref 0.0–4.7)

## 2021-09-12 ENCOUNTER — Other Ambulatory Visit (HOSPITAL_COMMUNITY): Payer: Self-pay | Admitting: Hematology

## 2021-09-12 ENCOUNTER — Ambulatory Visit (HOSPITAL_COMMUNITY): Admission: RE | Admit: 2021-09-12 | Payer: Medicare Other | Source: Ambulatory Visit

## 2021-09-12 ENCOUNTER — Ambulatory Visit (HOSPITAL_COMMUNITY)
Admission: RE | Admit: 2021-09-12 | Discharge: 2021-09-12 | Disposition: A | Discharge status: Home / Self Care | Payer: Medicare Other | Source: Ambulatory Visit | Attending: Hematology | Admitting: Hematology

## 2021-09-12 DIAGNOSIS — C20 Malignant neoplasm of rectum: Secondary | ICD-10-CM

## 2021-09-12 IMAGING — CT CT CHEST W/ CM
3 of 5 series · 16 of 36 positions shown, 18 images · IV contrast (APPLIED)
Comparison: CT of the chest from [DATE].

CLINICAL DATA: Colorectal cancer staging in a 60-year-old male.

EXAM:
CT CHEST AND ABDOMEN WITH CONTRAST
TECHNIQUE: Multidetector CT imaging of the chest and abdomen was performed
following the standard protocol during bolus administration of
intravenous contrast.
CONTRAST:  80mL OMNIPAQUE IOHEXOL 350 MG/ML SOLN

[Series 2: ca with · axial · 0.96mm/px · z∈[+1389,+1719]mm · 7 of 89 slices shown, 9 images]
[im 12/89  mediastinal]
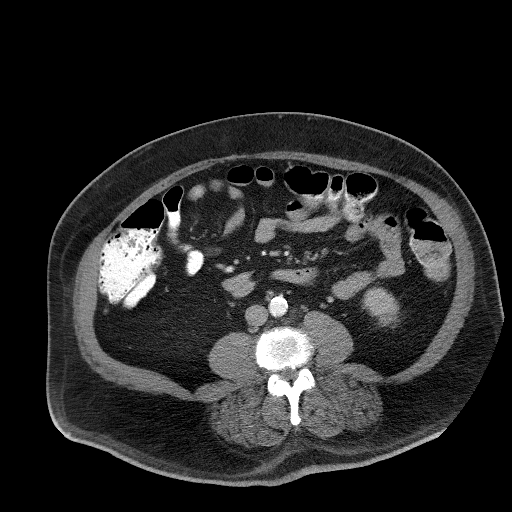
[im 12/89  lung]
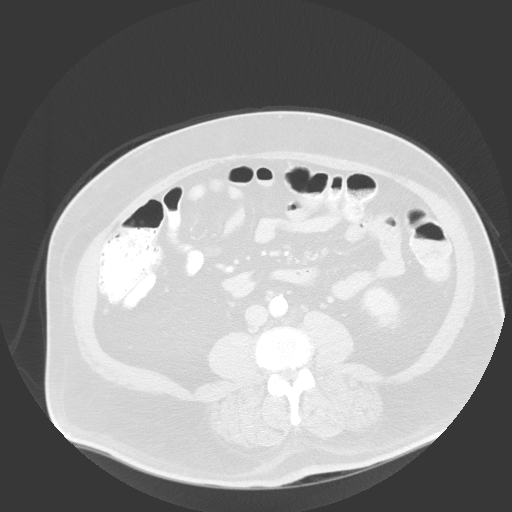
[im 23/89  lung]
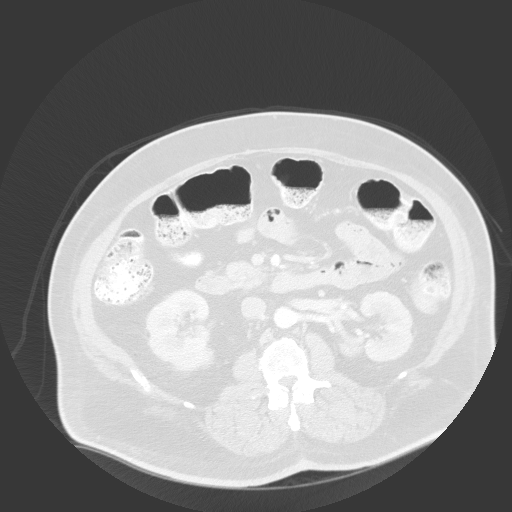
[im 34/89  lung]
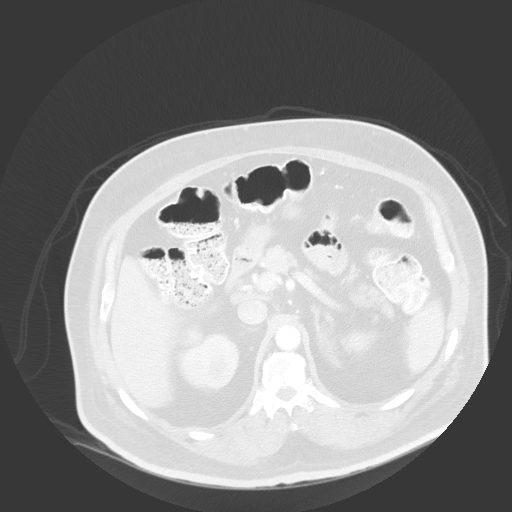
[im 45/89  lung]
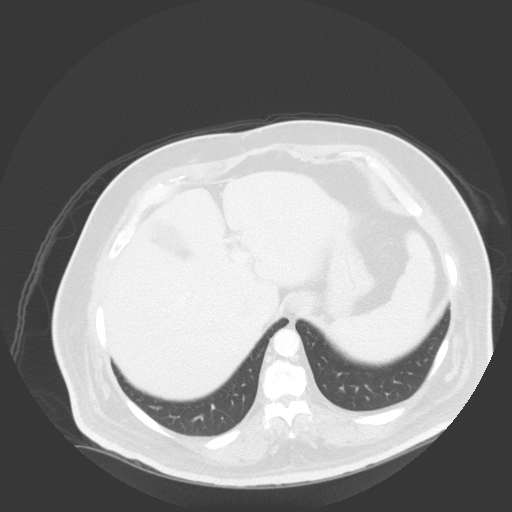
[im 56/89  mediastinal]
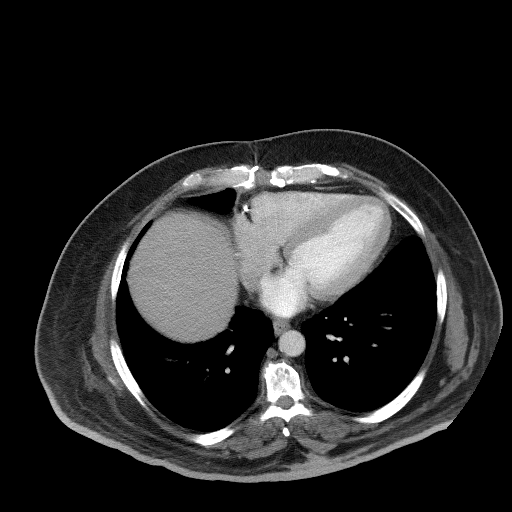
[im 56/89  lung]
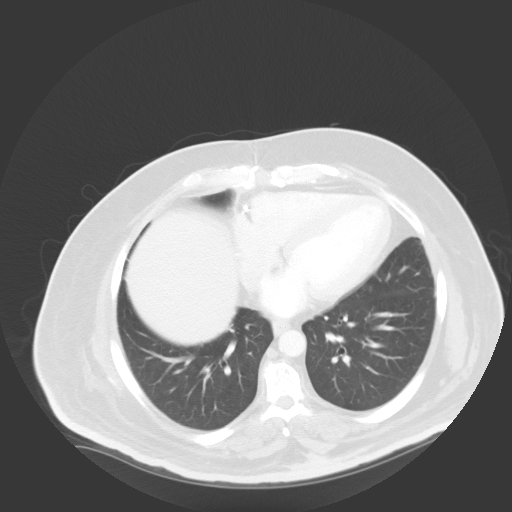
[im 67/89  lung]
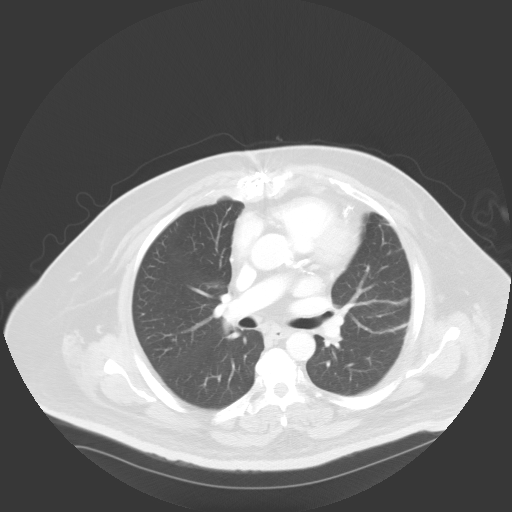
[im 78/89  lung]
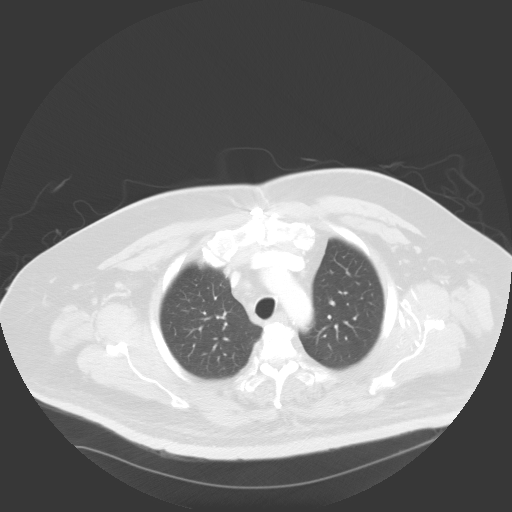

[Series 4: lung · axial · 0.96mm/px · z∈[+1488,+1662]mm · 6 of 153 slices shown]
[im 11/153  lung]
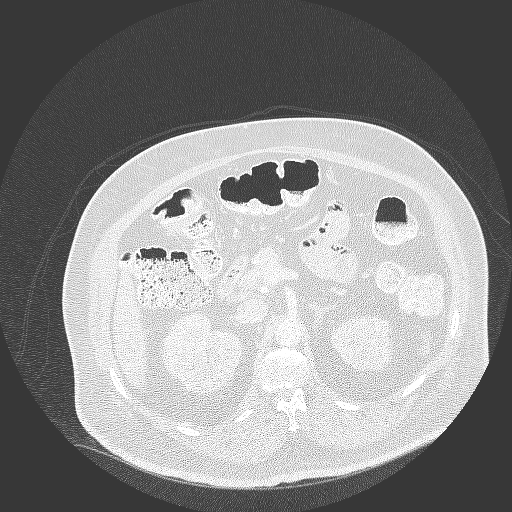
[im 33/153  lung]
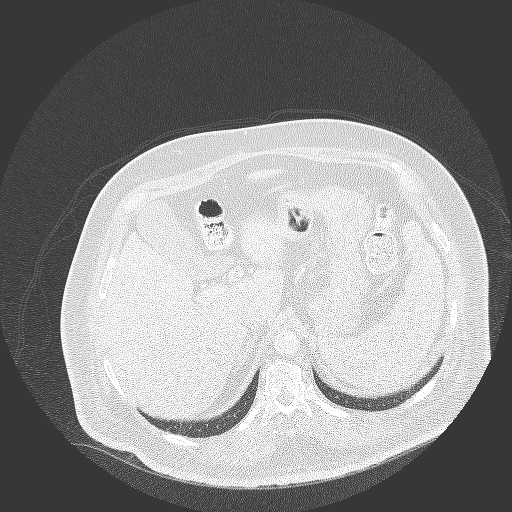
[im 55/153  lung]
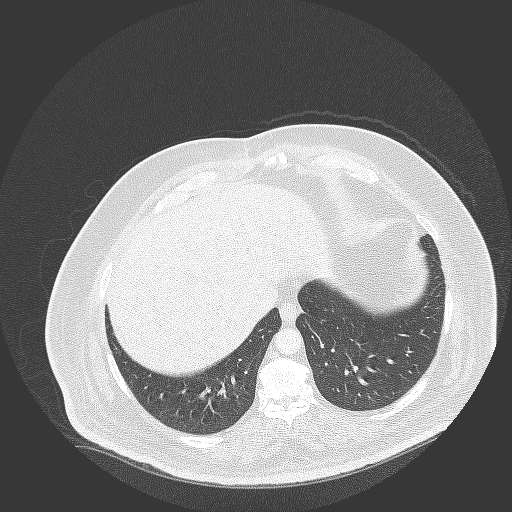
[im 66/153  lung]
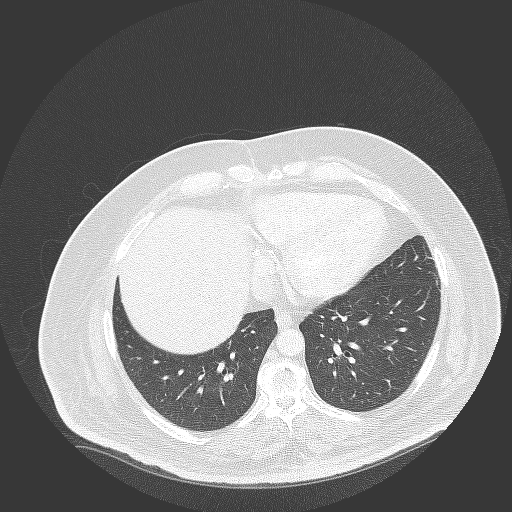
[im 87/153  lung]
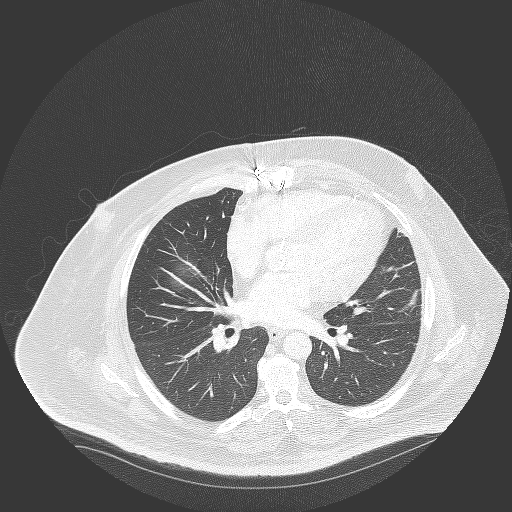
[im 98/153  lung]
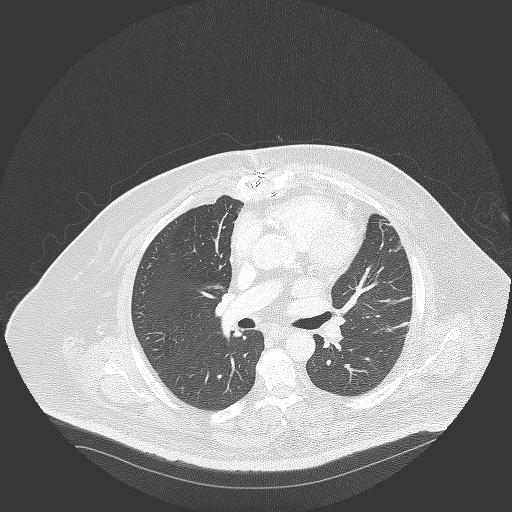

[Series 5: coronals · coronal · 0.98mm/px · 3 of 152 slices shown]
[im 31/152  lung]
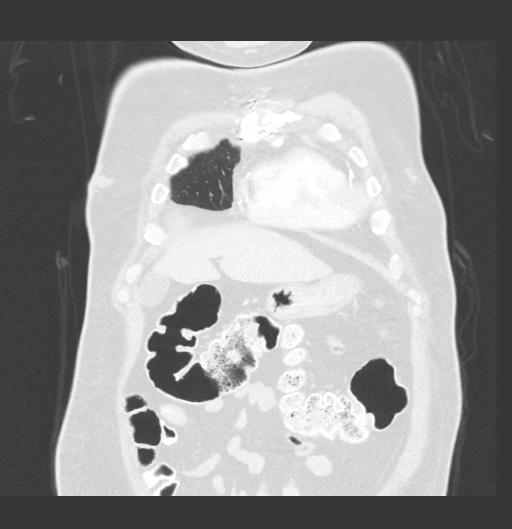
[im 61/152  lung]
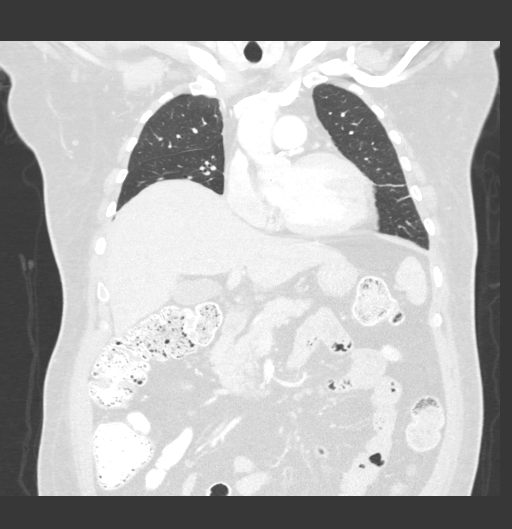
[im 91/152  lung]
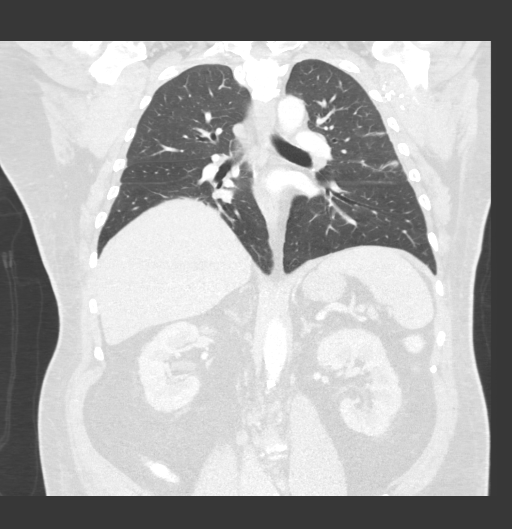

[16 of 36 positions shown; findings below may reference images not displayed]

FINDINGS: CT CHEST FINDINGS

Cardiovascular: Calcified and noncalcified atheromatous plaque in
the thoracic aorta. Three-vessel coronary artery disease. Mildly
enlarged heart. Central pulmonary vasculature is unremarkable on
venous phase.

Mediastinum/Nodes: No thoracic inlet, axillary, mediastinal or hilar
adenopathy. Esophagus grossly normal.

Lungs/Pleura: Scarring in the upper lobe and lingula. No effusion.
No consolidative process. Airways are patent.

Musculoskeletal: See below for full musculoskeletal details. Signs
of sternotomy with separation of sternotomy margins and bony
resorption, unchanged compared to previous imaging. No frank bony
destruction or fluid collection.

CT ABDOMEN FINDINGS

Hepatobiliary: Fissural widening and mildly lobular hepatic contours
without visible lesion, biliary duct dilation or pericholecystic
stranding. Portal vein is patent.

Pancreas: Normal, without mass, inflammation or ductal dilatation.

Spleen: Top normal splenic size at 16 cm greatest axial dimension,
normal craniocaudal span.

Adrenals/Urinary Tract: Adrenal glands are normal. Mild perinephric
stranding. Mild cortical scarring. No suspicious renal lesion or
hydronephrosis.

Stomach/Bowel: No acute gastrointestinal process to the extent
evaluated on the abdominal portion of the CT.

Vascular/Lymphatic: Atherosclerotic changes of the abdominal aorta.
No aneurysmal dilation. Smooth contour the IVC. Top-normal size LEFT
para-aortic lymph nodes (image 83/2 and 85/2) 10-12 mm size but with
fatty hila.

Other: No ascites.  No free air.

Musculoskeletal: No acute bone finding. No destructive bone process.
Spinal degenerative changes. Sternotomy changes as above. Marked
central canal narrowing due to degenerative changes at the L4-5
level.
IMPRESSION: No signs of metastatic disease to the chest.

Top-normal size LEFT para-aortic lymph nodes but with fatty hila,
nonspecific, comparison with prior imaging may be helpful findings
of mild suspicion given reported rectal neoplasm. Attention on
follow-up.

Fissural widening and mildly lobular hepatic contours without
visible lesion. Correlate with any clinical or laboratory evidence
of liver disease. Also with top-normal size spleen.

Central canal narrowing at L4-5 due to degenerative changes.
Correlate with any radicular symptoms.

Aortic Atherosclerosis ([L9]-[L9]).

## 2021-09-12 IMAGING — CT CT ABDOMEN W/ CM
3 of 5 series · 12 of 46 positions shown, 17 images · IV contrast (APPLIED)
Comparison: CT of the chest from [DATE].

CLINICAL DATA: Colorectal cancer staging in a 60-year-old male.

EXAM:
CT CHEST AND ABDOMEN WITH CONTRAST
TECHNIQUE: Multidetector CT imaging of the chest and abdomen was performed
following the standard protocol during bolus administration of
intravenous contrast.
CONTRAST:  80mL OMNIPAQUE IOHEXOL 350 MG/ML SOLN

[Series 2: ca with · axial · 0.96mm/px · z∈[+1379,+1724]mm · 8 of 89 slices shown, 13 images]
[im 10/89  soft-tissue]
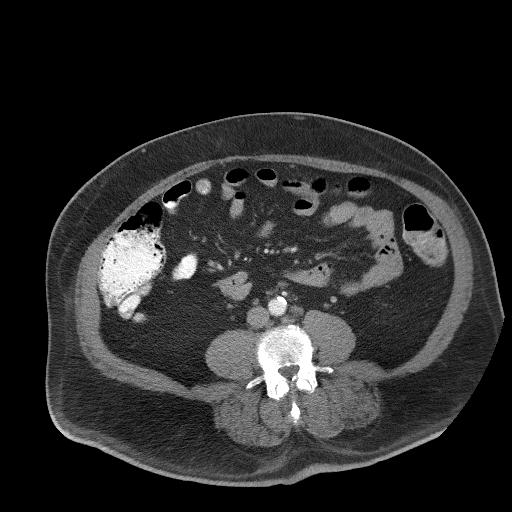
[im 10/89  bone]
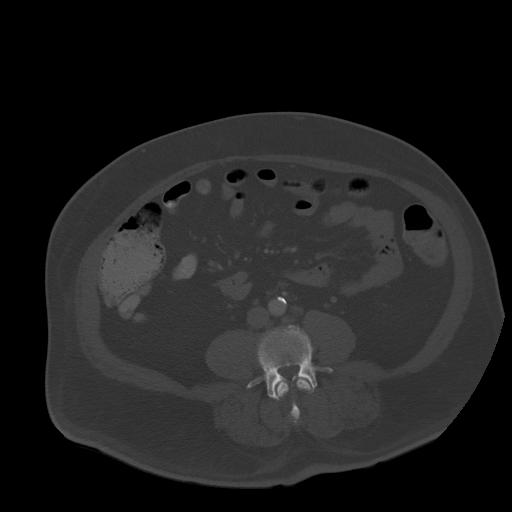
[im 20/89  soft-tissue]
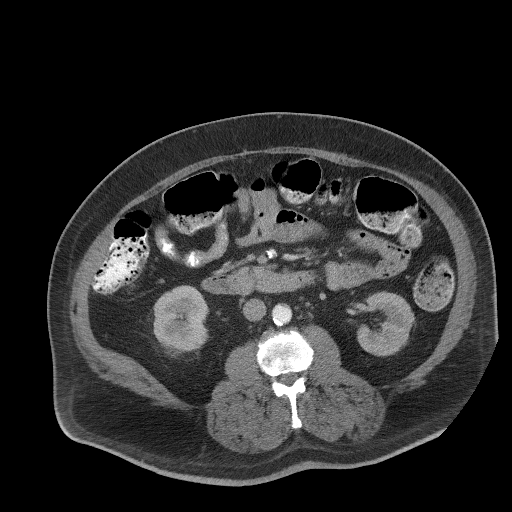
[im 30/89  soft-tissue]
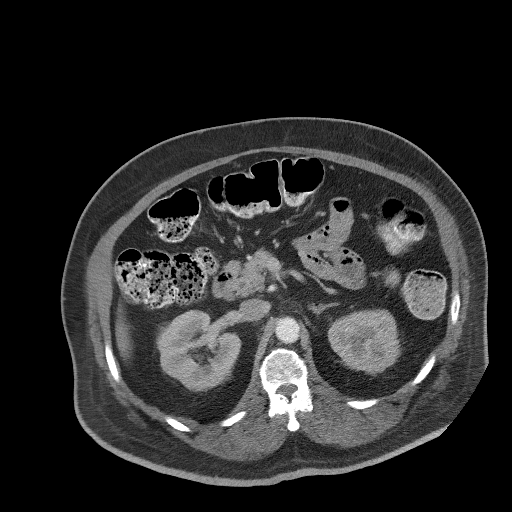
[im 40/89  soft-tissue]
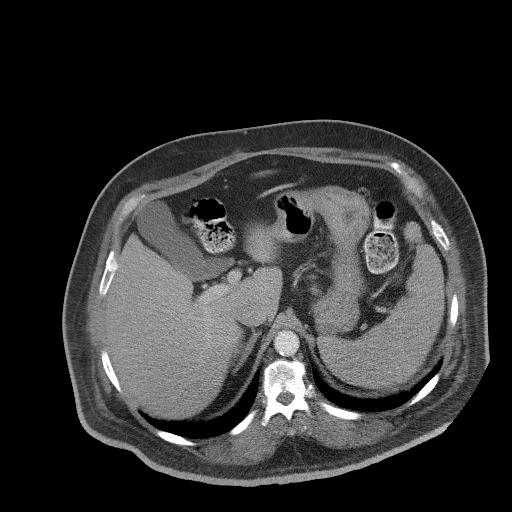
[im 49/89  soft-tissue]
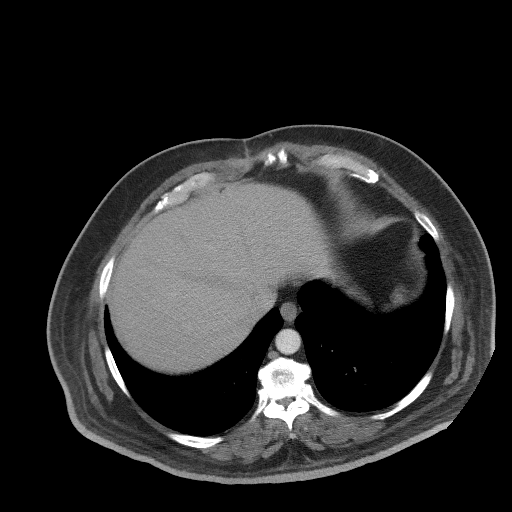
[im 49/89  lung]
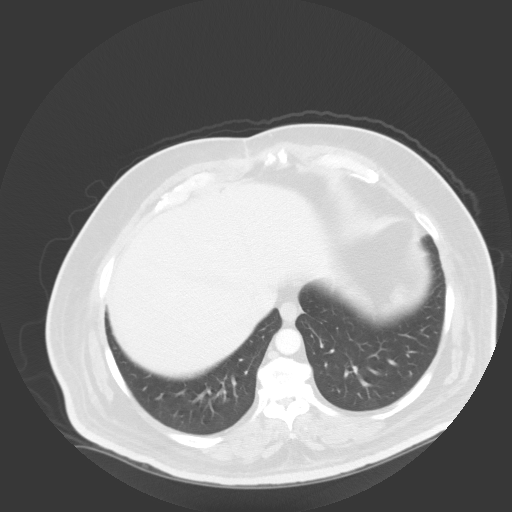
[im 59/89  soft-tissue]
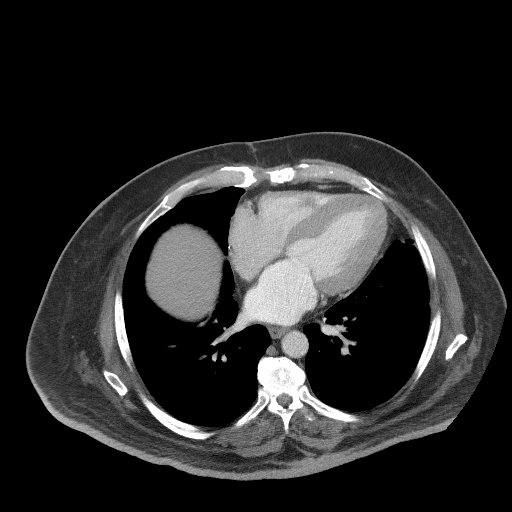
[im 59/89  lung]
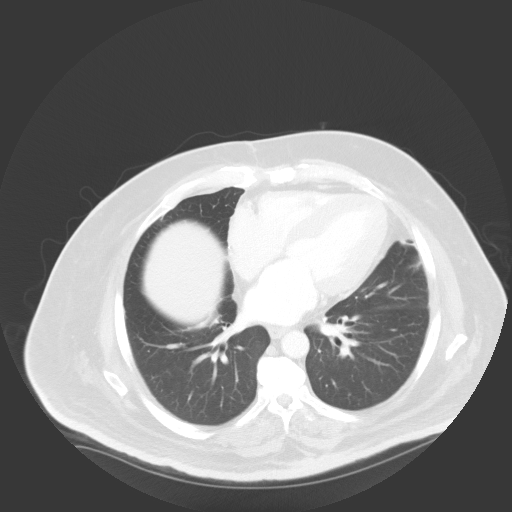
[im 69/89  soft-tissue]
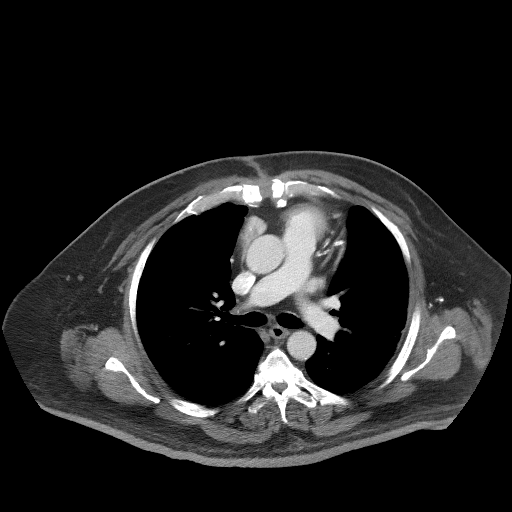
[im 69/89  lung]
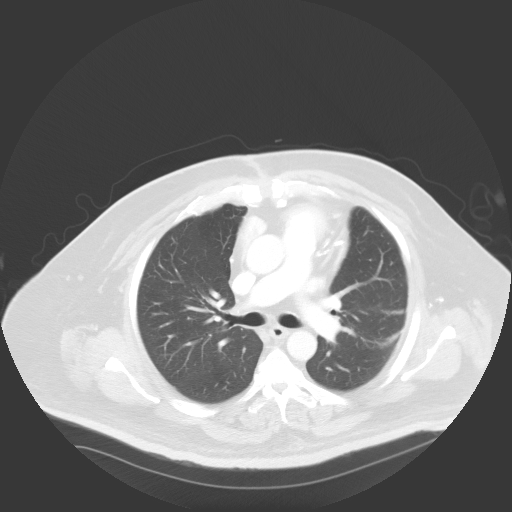
[im 79/89  soft-tissue]
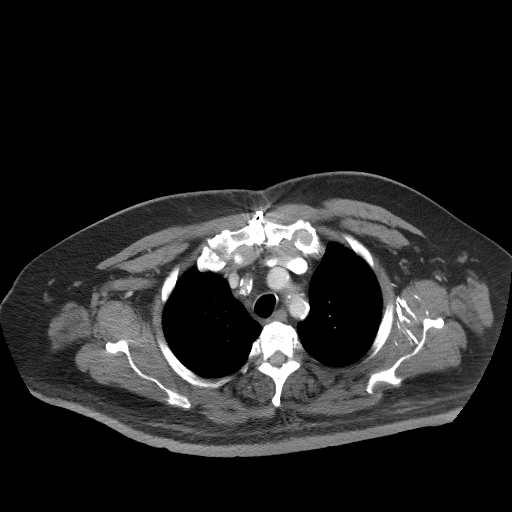
[im 79/89  lung]
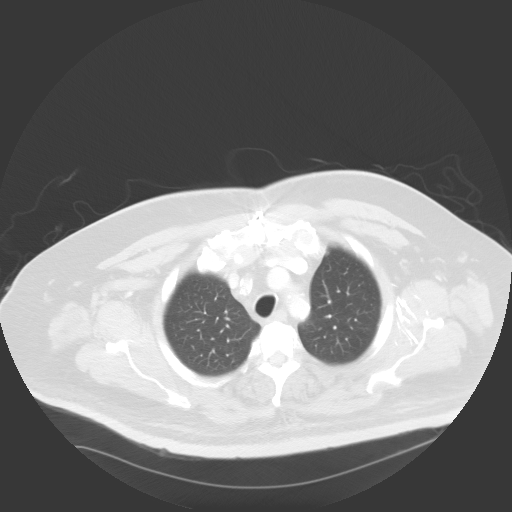

[Series 5: coronals · coronal · 0.98mm/px · 3 of 149 slices shown]
[im 50/149  soft-tissue]
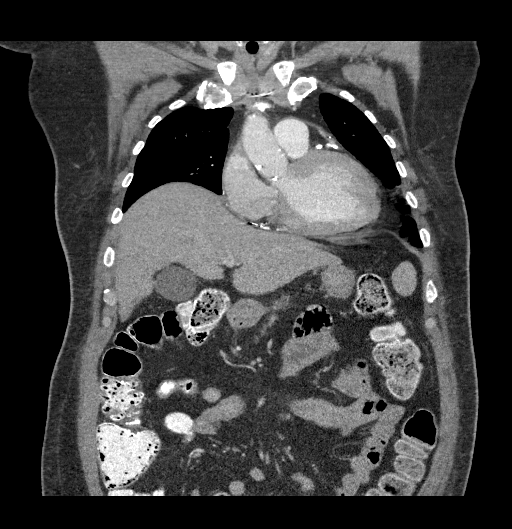
[im 66/149  soft-tissue]
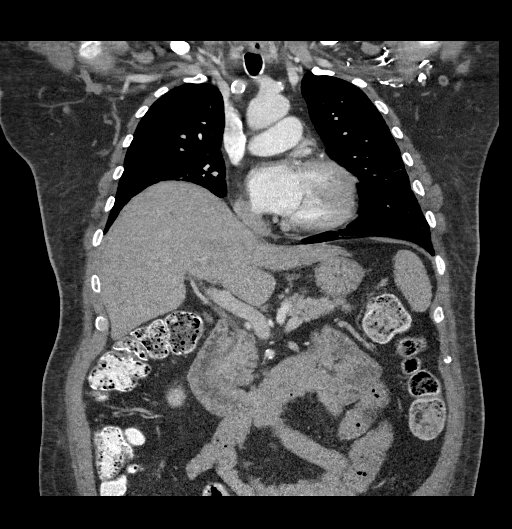
[im 83/149  soft-tissue]
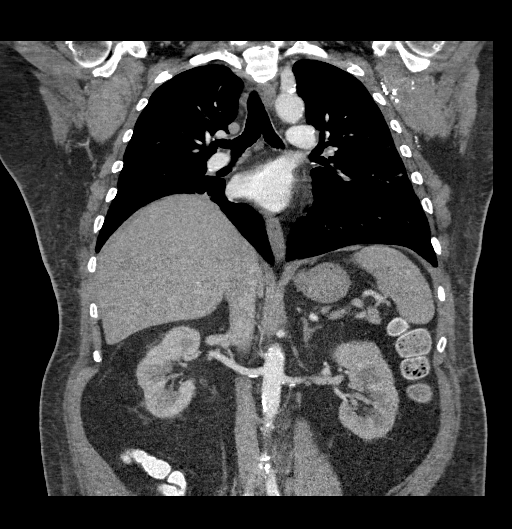

[Series 6: sagittals · sagittal · 1.00mm/px · 1 of 193 slices shown]
[im 65/193  soft-tissue]
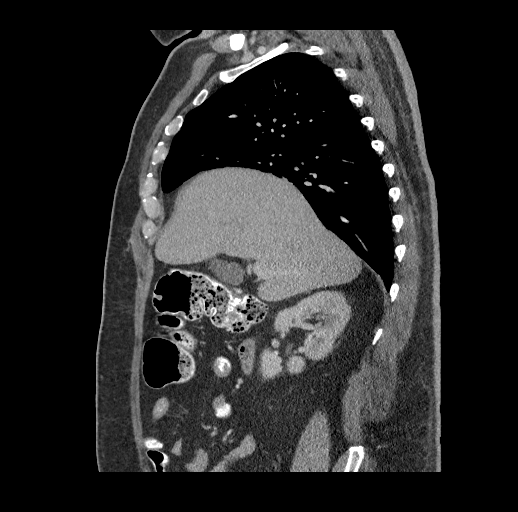

[12 of 46 positions shown; findings below may reference images not displayed]

FINDINGS: CT CHEST FINDINGS

Cardiovascular: Calcified and noncalcified atheromatous plaque in
the thoracic aorta. Three-vessel coronary artery disease. Mildly
enlarged heart. Central pulmonary vasculature is unremarkable on
venous phase.

Mediastinum/Nodes: No thoracic inlet, axillary, mediastinal or hilar
adenopathy. Esophagus grossly normal.

Lungs/Pleura: Scarring in the upper lobe and lingula. No effusion.
No consolidative process. Airways are patent.

Musculoskeletal: See below for full musculoskeletal details. Signs
of sternotomy with separation of sternotomy margins and bony
resorption, unchanged compared to previous imaging. No frank bony
destruction or fluid collection.

CT ABDOMEN FINDINGS

Hepatobiliary: Fissural widening and mildly lobular hepatic contours
without visible lesion, biliary duct dilation or pericholecystic
stranding. Portal vein is patent.

Pancreas: Normal, without mass, inflammation or ductal dilatation.

Spleen: Top normal splenic size at 16 cm greatest axial dimension,
normal craniocaudal span.

Adrenals/Urinary Tract: Adrenal glands are normal. Mild perinephric
stranding. Mild cortical scarring. No suspicious renal lesion or
hydronephrosis.

Stomach/Bowel: No acute gastrointestinal process to the extent
evaluated on the abdominal portion of the CT.

Vascular/Lymphatic: Atherosclerotic changes of the abdominal aorta.
No aneurysmal dilation. Smooth contour the IVC. Top-normal size LEFT
para-aortic lymph nodes (image 83/2 and 85/2) 10-12 mm size but with
fatty hila.

Other: No ascites.  No free air.

Musculoskeletal: No acute bone finding. No destructive bone process.
Spinal degenerative changes. Sternotomy changes as above. Marked
central canal narrowing due to degenerative changes at the L4-5
level.
IMPRESSION: No signs of metastatic disease to the chest.

Top-normal size LEFT para-aortic lymph nodes but with fatty hila,
nonspecific, comparison with prior imaging may be helpful findings
of mild suspicion given reported rectal neoplasm. Attention on
follow-up.

Fissural widening and mildly lobular hepatic contours without
visible lesion. Correlate with any clinical or laboratory evidence
of liver disease. Also with top-normal size spleen.

Central canal narrowing at L4-5 due to degenerative changes.
Correlate with any radicular symptoms.

Aortic Atherosclerosis ([L9]-[L9]).

## 2021-09-12 MED ORDER — IOHEXOL 350 MG/ML SOLN
80.0000 mL | Freq: Once | INTRAVENOUS | Status: AC | PRN
  Administered 2021-09-12: 80 mL via INTRAVENOUS

## 2021-09-16 NOTE — Progress Notes (Signed)
Shane Chambers, [place] [id number]   CLINIC:  Medical Oncology/Hematology  PCP:  System, Provider Not In None None   REASON FOR VISIT:  Follow-up for rectal adenocarcinoma  PRIOR THERAPY: none  CURRENT THERAPY: under work-up  BRIEF ONCOLOGIC HISTORY:  Oncology History   No history exists.    CANCER STAGING: Cancer Staging Rectal cancer [facility]) Staging form: Colon and Rectum, AJCC 8th Edition - Clinical stage from 09/03/2021: Stage 0 (cTis, cN0, cM0) - Unsigned   INTERVAL HISTORY:  [name], a 60 y.o. male, returns for routine follow-up of his rectal adenocarcinoma. Shane Chambers was last seen on 09/03/2021.   Today he reports feeling well. He reports fatigue.   REVIEW OF SYSTEMS:  Review of Systems  Constitutional:  Positive for fatigue (75%). Negative for appetite change (60%).  Gastrointestinal:  Positive for constipation.  Genitourinary:  Positive for difficulty urinating (decreased flow).   Neurological:  Positive for dizziness, headaches and numbness.  Psychiatric/Behavioral:  Positive for sleep disturbance.   All other systems reviewed and are negative.  PAST MEDICAL/SURGICAL HISTORY:  Past Medical History:  Diagnosis Date   CAD (coronary artery disease)    a. s/p CABG in 03/2020 with LIMA-LAD, RIMA-PL, RA-[id number]-OM1   Cellulitis    Depression    Diabetes mellitus without complication ([place])    Hyperlipidemia 12/01/2019   Hypertension    Ischemic cardiomyopathy    a. EF 20-25% by echo in 02/2020 b. at 40% by echo in 03/2020 c. EF normalized to 60-65% by echo in 04/2020   Peripheral vascular disease ([place])    Sleep apnea    Type 2 diabetes mellitus ([place])    Past Surgical History:  Procedure Laterality Date   BIOPSY  07/18/2021   Procedure: BIOPSY;  Surgeon: Eloise Harman, DO;  [facility];  Service: Endoscopy;;   COLONOSCOPY WITH PROPOFOL N/A 07/18/2021   Procedure: COLONOSCOPY WITH PROPOFOL;  Surgeon:  Eloise Harman, DO;  [facility];  Service: Endoscopy;  Laterality: N/A;  8:00am   CORONARY ARTERY BYPASS GRAFT N/A 03/13/2020   Procedure: CORONARY ARTERY BYPASS GRAFTING (CABG) times five using bilateral Internal mammary arteries and left radial artery;  Surgeon: [name], MD;  Location: [place];  Service: Open Heart Surgery;  Laterality: N/A;   DENTAL SURGERY     ESOPHAGOGASTRODUODENOSCOPY (EGD) WITH PROPOFOL N/A 07/18/2021   Procedure: ESOPHAGOGASTRODUODENOSCOPY (EGD) WITH PROPOFOL;  Surgeon: Eloise Harman, DO;  [facility];  Service: Endoscopy;  Laterality: N/A;   FLEXIBLE SIGMOIDOSCOPY N/A 08/26/2021   Procedure: FLEXIBLE SIGMOIDOSCOPY;  Surgeon: Eloise Harman, DO;  [facility];  Service: Endoscopy;  Laterality: N/A;  9:00am   POLYPECTOMY  07/18/2021   Procedure: POLYPECTOMY INTESTINAL;  Surgeon: Eloise Harman, DO;  [facility];  Service: Endoscopy;;   RADIAL ARTERY HARVEST Left 03/13/2020   Procedure: [place],;  Surgeon: [name], MD;  Location: [place];  Service: Open Heart Surgery;  Laterality: Left;   RIGHT/LEFT HEART CATH AND CORONARY ANGIOGRAPHY N/A 03/07/2020   Procedure: RIGHT/LEFT HEART CATH AND CORONARY ANGIOGRAPHY;  Surgeon: Sherren Mocha, MD;  Location: [place] [facility];  Service: Cardiovascular;  Laterality: N/A;   TEE WITHOUT CARDIOVERSION N/A 03/13/2020   Procedure: TRANSESOPHAGEAL ECHOCARDIOGRAM (TEE);  Surgeon: [name], MD;  Location: [place];  Service: Open Heart Surgery;  Laterality: N/A;    SOCIAL HISTORY:  Social History  Socioeconomic History   Marital status: Single    Spouse name: Not on file   Number of children: Not on file   Years of education: Not on file   Highest education level: Not on file  Occupational History   Not on file  Tobacco Use   Smoking status: Former    Packs/day: 1.50    Types: Cigarettes    Quit date: 05/25/1995    Years since quitting: 26.3    Smokeless tobacco: Never  Vaping Use   Vaping Use: Never used  Substance and Sexual Activity   Alcohol use: Yes    Comment: rarely   Drug use: No   Sexual activity: Not on file  Other Topics Concern   Not on file  Social History Narrative   Not on file   Social Determinants of Health   Financial Resource Strain: Not on file  Food Insecurity: Not on file  Transportation Needs: Not on file  Physical Activity: Not on file  Stress: Not on file  Social Connections: Not on file  Intimate Partner Violence: Not on file    FAMILY HISTORY:  Family History  Problem Relation Age of Onset   Hypertension Mother     CURRENT MEDICATIONS:  Current Outpatient Medications  Medication Sig Dispense Refill   acetaminophen (TYLENOL) 500 MG tablet Take 1,000 mg by mouth every 8 (eight) hours as needed for fever or moderate pain. (Patient not taking: Reported on 09/03/2021)     albuterol (VENTOLIN HFA) 108 (90 Base) MCG/ACT inhaler Inhale 1-2 puffs into the lungs every 6 (six) hours as needed for wheezing or shortness of breath. (Patient not taking: Reported on 09/03/2021) 18 g 3   aspirin 81 MG EC tablet Take 1 tablet (81 mg total) by mouth daily with breakfast. 30 tablet 12   atorvastatin (LIPITOR) 80 MG tablet Take 80 mg by mouth daily with supper.     budesonide-formoterol (SYMBICORT) 160-4.5 MCG/ACT inhaler Inhale 2 puffs into the lungs in the morning and at bedtime. 1 Inhaler 6   Cholecalciferol (VITAMIN D) 50 MCG (2000 UT) tablet Take 2,000 Units by mouth daily.     doxycycline (VIBRAMYCIN) 100 MG capsule Take 1 capsule (100 mg total) by mouth 2 (two) times daily. 20 capsule 0   gabapentin (NEURONTIN) 300 MG capsule Take 900 mg by mouth 2 (two) times daily.     insulin aspart protamine- aspart (NOVOLOG MIX 70/30) (70-30) 100 UNIT/ML injection Inject 15 Units into the skin 2 (two) times daily with a meal.     ketotifen (ZADITOR) 0.025 % ophthalmic solution Place 1 drop into both eyes every  morning.     levothyroxine (SYNTHROID) 75 MCG tablet Take 75 mcg by mouth daily before breakfast.     loperamide HCl (IMODIUM) 2 MG/15ML solution Take 4 mg by mouth every 6 (six) hours as needed for diarrhea or loose stools. 25m as needed (Patient not taking: Reported on 09/03/2021)     losartan (COZAAR) 50 MG tablet Take 50 mg by mouth daily.     magnesium oxide (MAG-OX) 400 MG tablet Take 400 mg by mouth daily.     metFORMIN (GLUCOPHAGE) 1000 MG tablet Take 1,000 mg by mouth 2 (two) times daily with a meal.     metoprolol succinate (TOPROL-XL) 25 MG 24 hr tablet Take 0.5 tablets (12.5 mg total) by mouth 2 (two) times daily. (Patient taking differently: Take 25 mg by mouth 2 (two) times daily.) 90 tablet 1   omeprazole (PRILOSEC) 20  MG capsule Take 20 mg by mouth daily before breakfast.     oxyCODONE-acetaminophen (PERCOCET/ROXICET) 5-325 MG tablet Take 1-2 tablets by mouth every 8 (eight) hours as needed for severe pain. (Patient not taking: Reported on 09/03/2021) 6 tablet 0   polyethylene glycol (MIRALAX / GLYCOLAX) 17 g packet Take 17 g by mouth daily as needed for moderate constipation.     potassium chloride SA (KLOR-CON) 20 MEQ tablet Take 1 tablet (20 mEq total) by mouth daily. 90 tablet 1   tamsulosin (FLOMAX) 0.4 MG CAPS capsule Take 0.8 mg by mouth daily.     torsemide (DEMADEX) 20 MG tablet Take 3 tablets (60 mg total) by mouth daily. & 20 mg daily as needed for 3 lb weight gain in 24 hours or 5 lbs in one week (Patient not taking: Reported on 09/03/2021) 90 tablet 6   traMADol (ULTRAM) 50 MG tablet Take 50 mg by mouth every 6 (six) hours as needed (pain). (Patient not taking: Reported on 09/03/2021)     venlafaxine XR (EFFEXOR-XR) 150 MG 24 hr capsule Take 150 mg by mouth daily with breakfast.     No current facility-administered medications for this visit.    ALLERGIES:  Allergies  Allergen Reactions   Ace Inhibitors Swelling and Cough    (Not on MAR at [facility] and  [place])   Other Itching    [name]   Penicillins     Has patient had a PCN reaction causing immediate rash, facial/tongue/throat swelling, SOB or lightheadedness with hypotension: Unknown Has patient had a PCN reaction causing severe rash involving mucus membranes or skin necrosis: Unknown Has patient had a PCN reaction that required hospitalization: Unknown Has patient had a PCN reaction occurring within the last 10 years: Unknown If all of the above answers are "NO", then may proceed with Cephalosporin use.     PHYSICAL EXAM:  Performance status (ECOG): 2 - Symptomatic, <50% confined to bed  There were no vitals filed for this visit. Wt Readings from Last 3 Encounters:  08/13/21 285 lb (129.3 kg)  07/30/21 285 lb (129.3 kg)  07/25/21 285 lb (129.3 kg)   Physical Exam Vitals reviewed.  Constitutional:      Appearance: Normal appearance.     Comments: In wheelchair  Cardiovascular:     Rate and Rhythm: Normal rate and regular rhythm.     Pulses: Normal pulses.     Heart sounds: Normal heart sounds.  Pulmonary:     Effort: Pulmonary effort is normal.     Breath sounds: Normal breath sounds.  Neurological:     General: No focal deficit present.     Mental Status: He is alert and oriented to person, place, and time.  Psychiatric:        Mood and Affect: Mood normal.        Behavior: Behavior normal.     LABORATORY DATA:  I have reviewed the labs as listed.  CBC Latest Ref Rng & Units 09/03/2021 07/30/2021 07/16/2021  WBC 4.0 - 10.5 K/uL 11.6(H) 15.5(H) 13.4(H)  Hemoglobin 13.0 - 17.0 g/dL 11.7(L) 11.4(L) 12.1(L)  Hematocrit 39.0 - 52.0 % 37.7(L) 36.7(L) 38.8(L)  Platelets 150 - 400 K/uL 301 386 329   CMP Latest Ref Rng & Units 09/03/2021 07/30/2021 07/16/2021  Glucose 70 - 99 mg/dL 79 112(H) 98  BUN 6 - 20 mg/dL 23(H) 20 22(H)  Creatinine 0.61 - 1.24 mg/dL 1.09 1.18 0.96  Sodium 135 - 145 mmol/L 138 136 138  Potassium 3.5 - 5.1 mmol/L 4.0 4.0 4.0  Chloride  98 - 111 mmol/L 99 100 101  CO2 22 - 32 mmol/L 30 27 26   Calcium 8.9 - 10.3 mg/dL 9.0 8.3(L) 9.1  Total Protein 6.5 - 8.1 g/dL 7.5 - -  Total Bilirubin 0.3 - 1.2 mg/dL 0.4 - -  Alkaline Phos 38 - 126 U/L 119 - -  AST 15 - 41 U/L 22 - -  ALT 0 - 44 U/L 14 - -    DIAGNOSTIC IMAGING:  I have independently reviewed the scans and discussed with the patient. CT Chest W Contrast  Result Date: 09/13/2021 CLINICAL DATA:  Colorectal cancer staging in a 60 year old male. EXAM: CT CHEST AND ABDOMEN WITH CONTRAST TECHNIQUE: Multidetector CT imaging of the chest and abdomen was performed following the standard protocol during bolus administration of intravenous contrast. CONTRAST:  32m OMNIPAQUE IOHEXOL 350 MG/ML SOLN COMPARISON:  CT of the chest from September of 2021. FINDINGS: CT CHEST FINDINGS Cardiovascular: Calcified and noncalcified atheromatous plaque in the thoracic aorta. Three-vessel coronary artery disease. Mildly enlarged heart. Central pulmonary vasculature is unremarkable on venous phase. Mediastinum/Nodes: No thoracic inlet, axillary, mediastinal or hilar adenopathy. Esophagus grossly normal. Lungs/Pleura: Scarring in the upper lobe and lingula. No effusion. No consolidative process. Airways are patent. Musculoskeletal: See below for full musculoskeletal details. Signs of sternotomy with separation of sternotomy margins and bony resorption, unchanged compared to previous imaging. No frank bony destruction or fluid collection. CT ABDOMEN FINDINGS Hepatobiliary: Fissural widening and mildly lobular hepatic contours without visible lesion, biliary duct dilation or pericholecystic stranding. Portal vein is patent. Pancreas: Normal, without mass, inflammation or ductal dilatation. Spleen: Top normal splenic size at 16 cm greatest axial dimension, normal craniocaudal span. Adrenals/Urinary Tract: Adrenal glands are normal. Mild perinephric stranding. Mild cortical scarring. No suspicious renal lesion or  hydronephrosis. Stomach/Bowel: No acute gastrointestinal process to the extent evaluated on the abdominal portion of the CT. Vascular/Lymphatic: Atherosclerotic changes of the abdominal aorta. No aneurysmal dilation. Smooth contour the IVC. Top-normal size LEFT para-aortic lymph nodes (image 83/2 and 85/2) 10-12 mm size but with fatty hila. Other: No ascites.  No free air. Musculoskeletal: No acute bone finding. No destructive bone process. Spinal degenerative changes. Sternotomy changes as above. Marked central canal narrowing due to degenerative changes at the L4-5 level. IMPRESSION: No signs of metastatic disease to the chest. Top-normal size LEFT para-aortic lymph nodes but with fatty hila, nonspecific, comparison with prior imaging may be helpful findings of mild suspicion given reported rectal neoplasm. Attention on follow-up. Fissural widening and mildly lobular hepatic contours without visible lesion. Correlate with any clinical or laboratory evidence of liver disease. Also with top-normal size spleen. Central canal narrowing at L4-5 due to degenerative changes. Correlate with any radicular symptoms. Aortic Atherosclerosis (ICD10-I70.0). Electronically Signed   By: GZetta BillsM.D.   On: 09/13/2021 08:57   CT Abdomen W Contrast  Result Date: 09/13/2021 CLINICAL DATA:  Colorectal cancer staging in a 60year old male. EXAM: CT CHEST AND ABDOMEN WITH CONTRAST TECHNIQUE: Multidetector CT imaging of the chest and abdomen was performed following the standard protocol during bolus administration of intravenous contrast. CONTRAST:  862mOMNIPAQUE IOHEXOL 350 MG/ML SOLN COMPARISON:  CT of the chest from September of 2021. FINDINGS: CT CHEST FINDINGS Cardiovascular: Calcified and noncalcified atheromatous plaque in the thoracic aorta. Three-vessel coronary artery disease. Mildly enlarged heart. Central pulmonary vasculature is unremarkable on venous phase. Mediastinum/Nodes: No thoracic inlet, axillary,  mediastinal or hilar adenopathy. Esophagus grossly  normal. Lungs/Pleura: Scarring in the upper lobe and lingula. No effusion. No consolidative process. Airways are patent. Musculoskeletal: See below for full musculoskeletal details. Signs of sternotomy with separation of sternotomy margins and bony resorption, unchanged compared to previous imaging. No frank bony destruction or fluid collection. CT ABDOMEN FINDINGS Hepatobiliary: Fissural widening and mildly lobular hepatic contours without visible lesion, biliary duct dilation or pericholecystic stranding. Portal vein is patent. Pancreas: Normal, without mass, inflammation or ductal dilatation. Spleen: Top normal splenic size at 16 cm greatest axial dimension, normal craniocaudal span. Adrenals/Urinary Tract: Adrenal glands are normal. Mild perinephric stranding. Mild cortical scarring. No suspicious renal lesion or hydronephrosis. Stomach/Bowel: No acute gastrointestinal process to the extent evaluated on the abdominal portion of the CT. Vascular/Lymphatic: Atherosclerotic changes of the abdominal aorta. No aneurysmal dilation. Smooth contour the IVC. Top-normal size LEFT para-aortic lymph nodes (image 83/2 and 85/2) 10-12 mm size but with fatty hila. Other: No ascites.  No free air. Musculoskeletal: No acute bone finding. No destructive bone process. Spinal degenerative changes. Sternotomy changes as above. Marked central canal narrowing due to degenerative changes at the L4-5 level. IMPRESSION: No signs of metastatic disease to the chest. Top-normal size LEFT para-aortic lymph nodes but with fatty hila, nonspecific, comparison with prior imaging may be helpful findings of mild suspicion given reported rectal neoplasm. Attention on follow-up. Fissural widening and mildly lobular hepatic contours without visible lesion. Correlate with any clinical or laboratory evidence of liver disease. Also with top-normal size spleen. Central canal narrowing at L4-5 due to  degenerative changes. Correlate with any radicular symptoms. Aortic Atherosclerosis (ICD10-I70.0). Electronically Signed   By: Zetta Bills M.D.   On: 09/13/2021 08:57   MR PELVIS WO CONTRAST  Result Date: 09/13/2021 CLINICAL DATA:  Colorectal cancer staging in a 60 year old male. Exam limitation due to patient discomfort in the scanner with highly limited assessment. Reportedly the patient has prior polypectomy history and ulceration in the rectum on recent flexible sigmoidoscopy. Signs of invasive tumor from previous biopsy. EXAM: MRI PELVIS WITHOUT CONTRAST TECHNIQUE: Multiplanar multisequence MR imaging of the pelvis was performed. No intravenous contrast was administered. COMPARISON:  CT chest, abdomen and pelvis September 12, 2021. FINDINGS: Urinary Tract: No distal ureteral dilation. Urinary bladder is collapsed limiting assessment. Bowel: Gross motion likely from rectal spasm and patient motion limits assessment of the rectum. Stool mixed with rectal gel are present in the rectum. There is no visible lesion though the exam is limited. No mesorectal lymph nodes are demonstrated though again there is marked motion potential LEFT mesorectal lymph node on image 24/3) within 1-2 mm of the mesorectal fascia. Depth of lesion invasion or site of biopsy not assessed. Vascular/Lymphatic: Limited pelvic assessment with normal caliber of pelvic vasculature and potential LEFT mesorectal lymph node as discussed. No gross pelvic sidewall adenopathy though the exam is limited and obtained with non fat saturated images and with limited coverage for rectal assessment. Reproductive:  Grossly unremarkable. Other:  No visible bone lesion. Musculoskeletal: No suspicious bone lesions identified. IMPRESSION: Rectal spasm and gross patient motion limits assessment. Site of biopsy and or lesion not visible and not well assessed. No gross extra colonic soft tissue, potential LEFT mesorectal lymph node as described. Given  limitations above would consider CT for nodal staging in the pelvis and continued surveillance. Repeat imaging could also be considered though the patient had difficulty in the bore of the magnet perhaps related to claustrophobia or similar discomfort. Attempts were made to mitigate these limitations which were  unsuccessful during the imaging session. Electronically Signed   By: Zetta Bills M.D.   On: 09/13/2021 09:54     ASSESSMENT:  Rectal adenocarcinoma arising in a polyp: - Colonoscopy on 07/18/2021 with 15 mm sessile polyp in the sigmoid colon removed with hot snare, 5 mm sessile polyp in the sigmoid colon removed. - Pathology of the sigmoid polypectomy shows invasive colonic adenocarcinoma involving a tubular adenoma.  Carcinoma invades to a depth of 2 mm.  Carcinoma is 1 mm from the margin.  No lymphovascular invasion.  No poorly differentiated component. - Flexible sigmoidoscopy on 08/26/2021 with 15 mm ulcer from previous polypectomy found in the rectum.  This is in the rectum approximately 5 to 8 cm from the anal verge.   Social/family history: - He is a retired Marine scientist.  He works for [employer]. - He quit smoking in 1996. - He is currently residing at [facility] in [place] since January 2022.  He was originally admitted there for rehab after cellulitis.  He reports that he will soon be discharged to a community apartment. - Maternal great aunt had breast cancer.  No other malignancies.   PLAN:  Rectal adenocarcinoma and a polyp: - Based on gross description of the pathology, it appears to be a fragmented specimen. - Reviewed CT chest and abdomen with contrast from 09/12/2021 which showed no sign of metastatic disease in the chest.  Top normal size of left para-aortic lymph node measuring 10 to 12 mm, but with fatty hila, nonspecific. - MRI of the pelvis without contrast from 09/12/2021 was reviewed by me.  Rectal spasm and gross patient motion limits assessment.  Site of  biopsy under lesion not visible and not well assessed.  No gross extracolonic soft tissue.  Potential left miso rectal lymph node within 1 to 2 mm of mesorectal fascia. - Given the limitations of the MRI, we have recommended CT pelvis with contrast.  He reports that he has claustrophobia. - We will also request MMR/MSI testing on the pathology. - RTC after the scan.  2.  Normocytic anemia: - Reviewed labs.  Hemoglobin is 11.7.  Ferritin is 30 and percent saturation 12. - He reports severe fatigue.  He is already taking iron tablet without help. - Talk to him about Feraheme weekly x2.  Talked about side effects including anaphylactic reaction. - He will receive premeds as he had previous medication allergies.   Orders placed this encounter:  No orders of the defined types were placed in this encounter.    Derek Jack, MD [place] [phone]   I, Thana Ates, am acting as a scribe for Dr. Derek Jack.  I, Derek Jack MD, have reviewed the above documentation for accuracy and completeness, and I agree with the above.

## 2021-09-17 ENCOUNTER — Inpatient Hospital Stay (HOSPITAL_COMMUNITY): Payer: Medicare Other | Attending: Hematology | Admitting: Hematology

## 2021-09-17 VITALS — BP 184/82 | HR 64 | Temp 97.8°F | Resp 18

## 2021-09-17 DIAGNOSIS — C20 Malignant neoplasm of rectum: Secondary | ICD-10-CM

## 2021-09-17 DIAGNOSIS — D509 Iron deficiency anemia, unspecified: Secondary | ICD-10-CM | POA: Diagnosis not present

## 2021-09-17 NOTE — Patient Instructions (Signed)
[place] at [facility] Discharge Instructions  You were seen and examined today by Dr. Delton Coombes.  We will arrange for you to have a CT scan of your pelvis for the questionable rectal lymph node seen on MRI.  We will schedule you for iron infusions x 2 doses, 1 week apart to help with your low iron and see if this helps with your energy.  Return as scheduled after CT scan to review results.    Thank you for choosing [place] at [facility] to provide your oncology and hematology care.  To afford each patient quality time with our provider, please arrive at least 15 minutes before your scheduled appointment time.   If you have a lab appointment with the [facility] please come in thru the Main Entrance and check in at the main information desk.  You need to re-schedule your appointment should you arrive 10 or more minutes late.  We strive to give you quality time with our providers, and arriving late affects you and other patients whose appointments are after yours.  Also, if you no show three or more times for appointments you may be dismissed from the clinic at the providers discretion.     Again, thank you for choosing [facility].  Our hope is that these requests will decrease the amount of time that you wait before being seen by our physicians.       _____________________________________________________________  Should you have questions after your visit to [facility], please contact our office at [phone] and follow the prompts.  Our office hours are 8:00 a.m. and 4:30 p.m. Monday - Friday.  Please note that voicemails left after 4:00 p.m. may not be returned until the following business day.  We are closed weekends and major holidays.  You do have access to a nurse 24-7, just call the main number to the clinic [phone] and do not press any options, hold on the line and a nurse will answer the phone.     For prescription refill requests, have your pharmacy contact our office and allow 72 hours.    Due to Covid, you will need to wear a mask upon entering the hospital. If you do not have a mask, a mask will be given to you at the Main Entrance upon arrival. For doctor visits, patients may have 1 support person age 38 or older with them. For treatment visits, patients can not have anyone with them due to social distancing guidelines and our immunocompromised population.

## 2021-09-19 ENCOUNTER — Other Ambulatory Visit: Payer: Self-pay

## 2021-09-19 ENCOUNTER — Inpatient Hospital Stay (HOSPITAL_COMMUNITY): Payer: Medicare Other

## 2021-09-19 VITALS — BP 164/75 | HR 70 | Temp 98.5°F | Resp 20

## 2021-09-19 DIAGNOSIS — D649 Anemia, unspecified: Secondary | ICD-10-CM

## 2021-09-19 DIAGNOSIS — D509 Iron deficiency anemia, unspecified: Secondary | ICD-10-CM

## 2021-09-19 MED ORDER — LORATADINE 10 MG PO TABS
10.0000 mg | ORAL_TABLET | Freq: Once | ORAL | Status: AC
  Administered 2021-09-19: 10 mg via ORAL
  Filled 2021-09-19: qty 1

## 2021-09-19 MED ORDER — FAMOTIDINE 20 MG IN NS 100 ML IVPB
20.0000 mg | Freq: Once | INTRAVENOUS | Status: AC
  Administered 2021-09-19: 20 mg via INTRAVENOUS
  Filled 2021-09-19: qty 20

## 2021-09-19 MED ORDER — SODIUM CHLORIDE 0.9 % IV SOLN: Freq: Once | INTRAVENOUS | Status: AC

## 2021-09-19 MED ORDER — ALPRAZOLAM 0.5 MG PO TABS
0.5000 mg | ORAL_TABLET | Freq: Once | ORAL | Status: AC
  Administered 2021-09-19: 0.5 mg via ORAL
  Filled 2021-09-19: qty 1

## 2021-09-19 MED ORDER — ACETAMINOPHEN 325 MG PO TABS
650.0000 mg | ORAL_TABLET | Freq: Once | ORAL | Status: AC
  Administered 2021-09-19: 650 mg via ORAL
  Filled 2021-09-19: qty 2

## 2021-09-19 MED ORDER — SODIUM CHLORIDE 0.9 % IV SOLN
510.0000 mg | Freq: Once | INTRAVENOUS | Status: AC
  Administered 2021-09-19: 510 mg via INTRAVENOUS
  Filled 2021-09-19: qty 510

## 2021-09-19 MED ORDER — METHYLPREDNISOLONE SODIUM SUCC 125 MG IJ SOLR
125.0000 mg | Freq: Once | INTRAMUSCULAR | Status: AC
  Administered 2021-09-19: 125 mg via INTRAVENOUS
  Filled 2021-09-19: qty 2

## 2021-09-19 NOTE — Progress Notes (Signed)
Pt presents today for Feraheme IV iron infusion per provider's order. Vitals signs stable and pt voiced no new complaints at this time.  Peripheral IV started with good blood return pre and post infusion.  1140 after pt's 30 minute wait time was completed, but stated he felt anxious and fidgety. Pt stated he didn't feel right.Charge nurse called to the room and vital signs obtained see flowsheet.  [place] called to the room and pt denies shortness of breath and no chest pain. R.[place]-PA stated to give pt xanax 0.5 mg p.o x 1 dose and to observe for 30 minutes. Xanax given at 12.  1245 Pt re-accessed and pt denies feeling anxious and fidgety at this time.Vital signs obtained see flowsheet. Pt discharged in stable condition.  Feraheme given today per MD orders. Tolerated infusion without adverse affects. Vital signs stable. No complaints at this time. Discharged from clinic via wheelchair in stable condition. Alert and oriented x 3. F/U with [facility] as scheduled.

## 2021-09-19 NOTE — Progress Notes (Signed)
Orders received to give Xanax 0.5 mg po X 1 dose   V.O. Tarri Abernethy, PA-C/Jahna Liebert Ronnald Ramp, PharmD 09/19/21 @ 1155

## 2021-09-19 NOTE — Patient Instructions (Signed)
[facility]  Discharge Instructions: Thank you for choosing [facility] to provide your oncology and hematology care.  If you have a lab appointment with the Cancer Center, please come in thru the Main Entrance and check in at the main information desk.  Wear comfortable clothing and clothing appropriate for easy access to any Portacath or PICC line.   We strive to give you quality time with your provider. You may need to reschedule your appointment if you arrive late (15 or more minutes).  Arriving late affects you and other patients whose appointments are after yours.  Also, if you miss three or more appointments without notifying the office, you may be dismissed from the clinic at the provider's discretion.      For prescription refill requests, have your pharmacy contact our office and allow 72 hours for refills to be completed.    Today you received the following chemotherapy and/or immunotherapy agents Venofer IV iron infusion.    BELOW ARE SYMPTOMS THAT SHOULD BE REPORTED IMMEDIATELY: *FEVER GREATER THAN 100.4 F (38 C) OR HIGHER *CHILLS OR SWEATING *NAUSEA AND VOMITING THAT IS NOT CONTROLLED WITH YOUR NAUSEA MEDICATION *UNUSUAL SHORTNESS OF BREATH *UNUSUAL BRUISING OR BLEEDING *URINARY PROBLEMS (pain or burning when urinating, or frequent urination) *BOWEL PROBLEMS (unusual diarrhea, constipation, pain near the anus) TENDERNESS IN MOUTH AND THROAT WITH OR WITHOUT PRESENCE OF ULCERS (sore throat, sores in mouth, or a toothache) UNUSUAL RASH, SWELLING OR PAIN  UNUSUAL VAGINAL DISCHARGE OR ITCHING   Items with * indicate a potential emergency and should be followed up as soon as possible or go to the Emergency Department if any problems should occur.  Please show the CHEMOTHERAPY ALERT CARD or IMMUNOTHERAPY ALERT CARD at check-in to the Emergency Department and triage nurse.  Should you have questions after your visit or need to cancel or reschedule your  appointment, please contact [facility] [phone]  and follow the prompts.  Office hours are 8:00 a.m. to 4:30 p.m. Monday - Friday. Please note that voicemails left after 4:00 p.m. may not be returned until the following business day.  We are closed weekends and major holidays. You have access to a nurse at all times for urgent questions. Please call the main number to the clinic [phone] and follow the prompts.  For any non-urgent questions, you may also contact your provider using MyChart. We now offer e-Visits for anyone 18 and older to request care online for non-urgent symptoms. For details visit [email or web address].   Also download the MyChart app! Go to the app store, search "MyChart", open the app, select [facility], and log in with your MyChart username and password.  Due to Covid, a mask is required upon entering the hospital/clinic. If you do not have a mask, one will be given to you upon arrival. For doctor visits, patients may have 1 support person aged 18 or older with them. For treatment visits, patients cannot have anyone with them due to current Covid guidelines and our immunocompromised population.  

## 2021-09-22 LAB — SURGICAL PATHOLOGY

## 2021-09-26 ENCOUNTER — Other Ambulatory Visit: Payer: Self-pay

## 2021-09-26 ENCOUNTER — Inpatient Hospital Stay (HOSPITAL_COMMUNITY): Payer: Medicare Other

## 2021-09-26 VITALS — BP 162/81 | HR 71 | Temp 97.7°F | Resp 18

## 2021-09-26 DIAGNOSIS — D509 Iron deficiency anemia, unspecified: Secondary | ICD-10-CM | POA: Diagnosis not present

## 2021-09-26 DIAGNOSIS — D649 Anemia, unspecified: Secondary | ICD-10-CM

## 2021-09-26 MED ORDER — SODIUM CHLORIDE 0.9 % IV SOLN: Freq: Once | INTRAVENOUS | Status: AC

## 2021-09-26 MED ORDER — METHYLPREDNISOLONE SODIUM SUCC 125 MG IJ SOLR
125.0000 mg | Freq: Once | INTRAMUSCULAR | Status: AC
  Administered 2021-09-26: 12:00:00 125 mg via INTRAVENOUS
  Filled 2021-09-26: qty 2

## 2021-09-26 MED ORDER — FAMOTIDINE 20 MG IN NS 100 ML IVPB: 20.0000 mg | Freq: Once | INTRAVENOUS | Status: DC

## 2021-09-26 MED ORDER — FAMOTIDINE IN NACL 20-0.9 MG/50ML-% IV SOLN
20.0000 mg | Freq: Once | INTRAVENOUS | Status: AC
  Administered 2021-09-26: 12:00:00 20 mg via INTRAVENOUS
  Filled 2021-09-26: qty 50

## 2021-09-26 MED ORDER — SODIUM CHLORIDE 0.9 % IV SOLN
510.0000 mg | Freq: Once | INTRAVENOUS | Status: AC
  Administered 2021-09-26: 510 mg via INTRAVENOUS
  Filled 2021-09-26: qty 510

## 2021-09-26 MED ORDER — LORATADINE 10 MG PO TABS
10.0000 mg | ORAL_TABLET | Freq: Once | ORAL | Status: AC
  Administered 2021-09-26: 11:00:00 10 mg via ORAL
  Filled 2021-09-26: qty 1

## 2021-09-26 MED ORDER — LOPERAMIDE HCL 2 MG PO CAPS
2.0000 mg | ORAL_CAPSULE | Freq: Once | ORAL | Status: AC
  Administered 2021-09-26: 11:00:00 2 mg via ORAL
  Filled 2021-09-26: qty 1

## 2021-09-26 MED ORDER — ACETAMINOPHEN 325 MG PO TABS
650.0000 mg | ORAL_TABLET | Freq: Once | ORAL | Status: AC
  Administered 2021-09-26: 11:00:00 650 mg via ORAL
  Filled 2021-09-26: qty 2

## 2021-09-26 NOTE — Patient Instructions (Signed)
[facility]  Discharge Instructions: Thank you for choosing [facility] to provide your oncology and hematology care.  If you have a lab appointment with the [facility], please come in thru the Main Entrance and check in at the main information desk.  Wear comfortable clothing and clothing appropriate for easy access to any Portacath or PICC line.   We strive to give you quality time with your provider. You may need to reschedule your appointment if you arrive late (15 or more minutes).  Arriving late affects you and other patients whose appointments are after yours.  Also, if you miss three or more appointments without notifying the office, you may be dismissed from the clinic at the provider's discretion.      For prescription refill requests, have your pharmacy contact our office and allow 72 hours for refills to be completed.       Should you have questions after your visit or need to cancel or reschedule your appointment, please contact [facility] [phone]  and follow the prompts.  Office hours are 8:00 a.m. to 4:30 p.m. Monday - Friday. Please note that voicemails left after 4:00 p.m. may not be returned until the following business day.  We are closed weekends and major holidays. You have access to a nurse at all times for urgent questions. Please call the main number to the clinic [phone] and follow the prompts.  For any non-urgent questions, you may also contact your provider using MyChart. We now offer e-Visits for anyone 11 and older to request care online for non-urgent symptoms. For details visit [email or web address].   Also download the MyChart app! Go to the app store, search "MyChart", open the app, select [facility], and log in with your MyChart username and password.  Due to Covid, a mask is required upon entering the hospital/clinic. If you do not have a mask, one will be given to you upon arrival. For doctor visits,  patients may have 1 support person aged 73 or older with them. For treatment visits, patients cannot have anyone with them due to current Covid guidelines and our immunocompromised population.

## 2021-09-26 NOTE — Progress Notes (Signed)
Iron infusion given per orders. Patient tolerated it well without problems. Vitals stable and discharged home from clinic ambulatory. Follow up as scheduled.  

## 2021-10-16 ENCOUNTER — Ambulatory Visit (HOSPITAL_COMMUNITY)
Admission: RE | Admit: 2021-10-16 | Discharge: 2021-10-16 | Disposition: A | Discharge status: Home / Self Care | Payer: Medicare Other | Source: Ambulatory Visit | Attending: Hematology | Admitting: Hematology

## 2021-10-16 ENCOUNTER — Other Ambulatory Visit: Payer: Self-pay

## 2021-10-16 ENCOUNTER — Encounter (HOSPITAL_COMMUNITY): Payer: Self-pay | Admitting: Radiology

## 2021-10-16 DIAGNOSIS — C20 Malignant neoplasm of rectum: Secondary | ICD-10-CM | POA: Diagnosis present

## 2021-10-16 LAB — POCT I-STAT CREATININE: Creatinine, Ser: 1.6 mg/dL — ABNORMAL HIGH (ref 0.61–1.24)

## 2021-10-16 IMAGING — CT CT PELVIS W/ CM
2 of 4 series · 16 of 46 positions shown, 18 images · IV contrast (omnipaque)
Comparison: Overlapping portion is CT abdomen [DATE]

CLINICAL DATA: Colorectal cancer. Pelvic MRI complicated by motion
artifact during staging.

EXAM:
CT PELVIS WITH CONTRAST
TECHNIQUE: Multidetector CT imaging of the pelvis was performed using the
standard protocol following the bolus administration of intravenous
contrast.
CONTRAST:  80mL OMNIPAQUE IOHEXOL 300 MG/ML  SOLN

[Series 2: axial st · axial · 0.96mm/px · z∈[+992,+1318]mm · 13 of 75 slices shown, 15 images]
[im 5/75  soft-tissue]
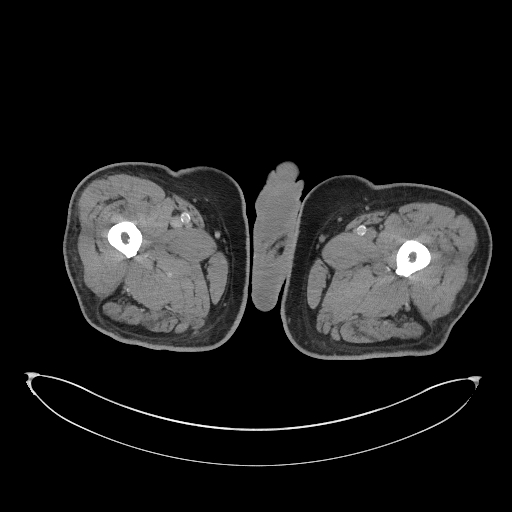
[im 5/75  bone]
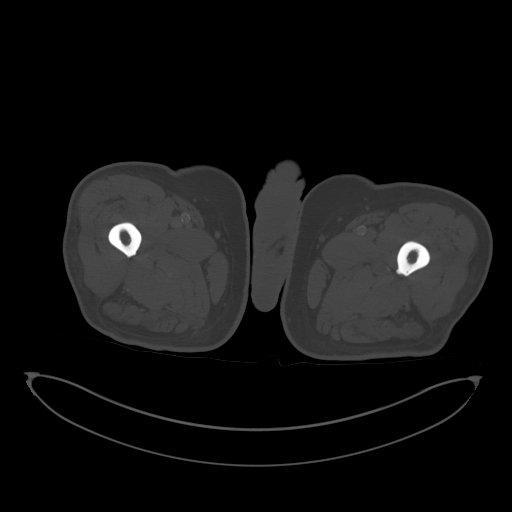
[im 10/75  soft-tissue]
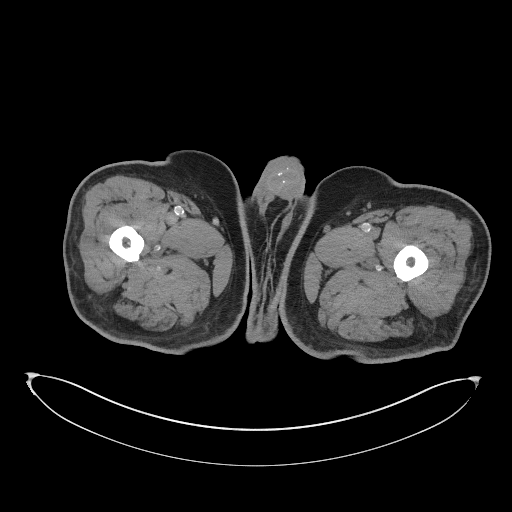
[im 15/75  soft-tissue]
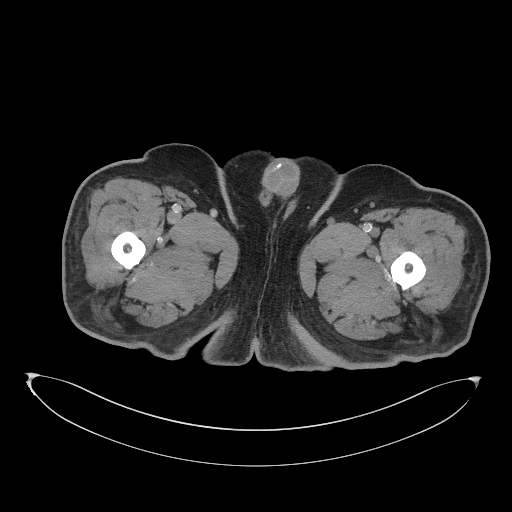
[im 20/75  soft-tissue]
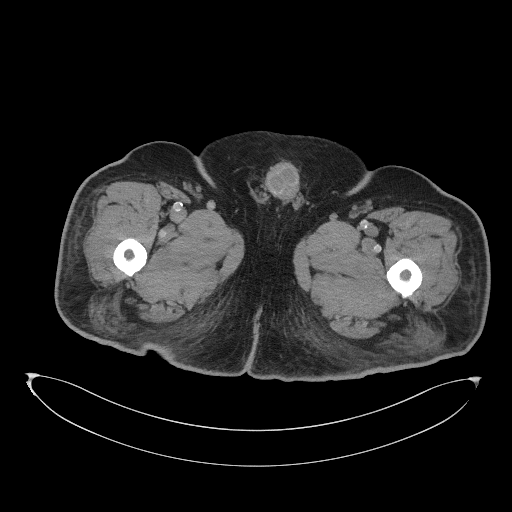
[im 25/75  soft-tissue]
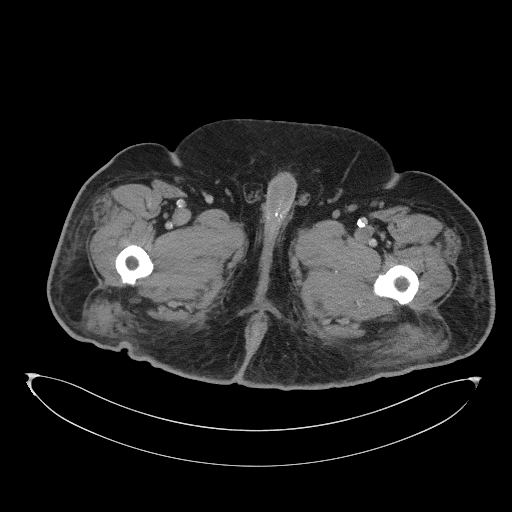
[im 30/75  soft-tissue]
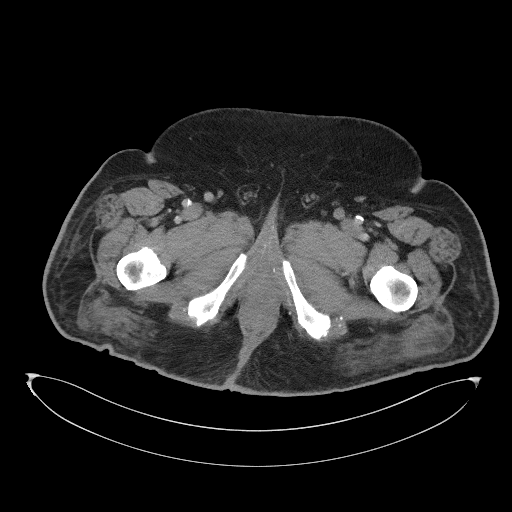
[im 40/75  soft-tissue]
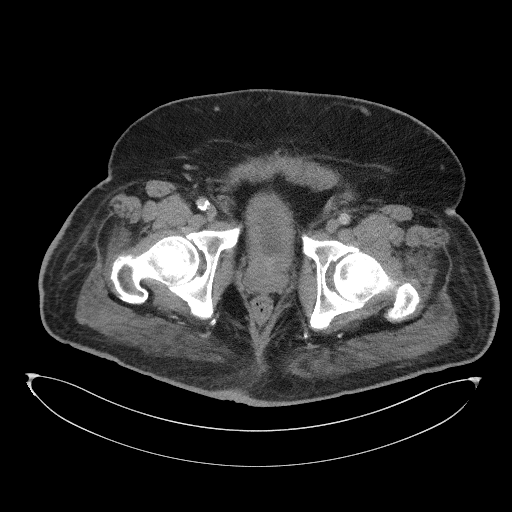
[im 45/75  soft-tissue]
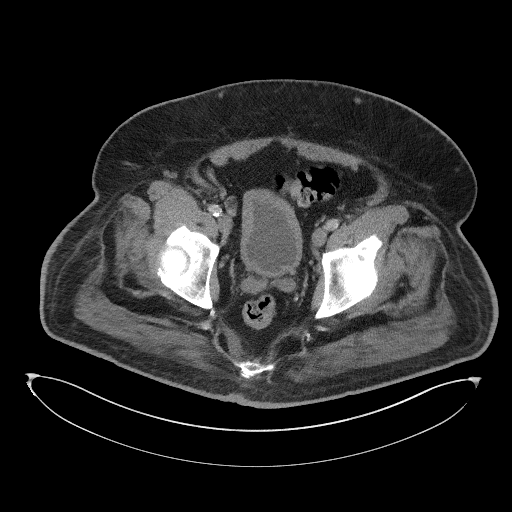
[im 50/75  soft-tissue]
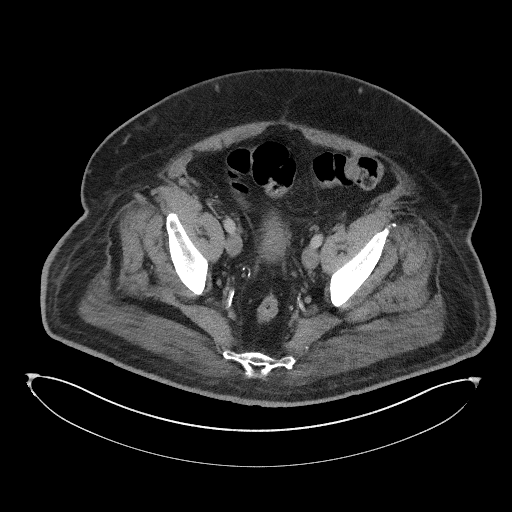
[im 50/75  bone]
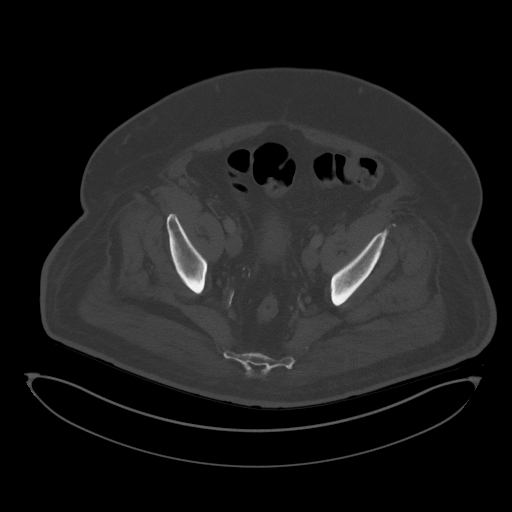
[im 55/75  soft-tissue]
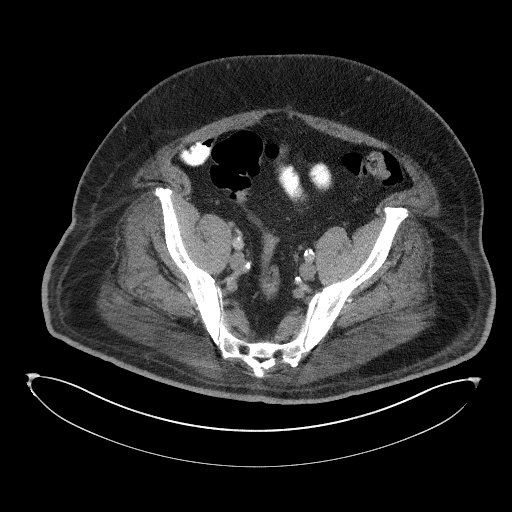
[im 60/75  soft-tissue]
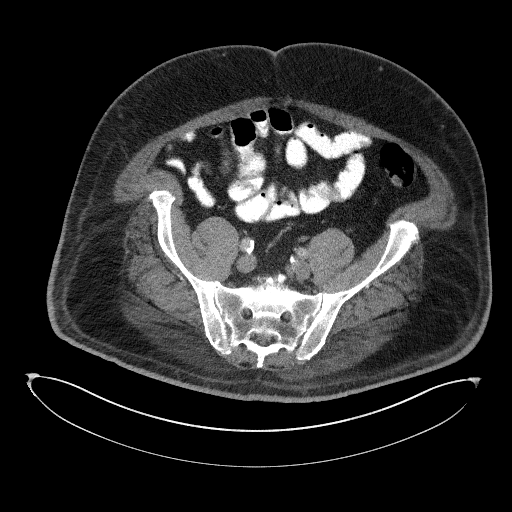
[im 65/75  soft-tissue]
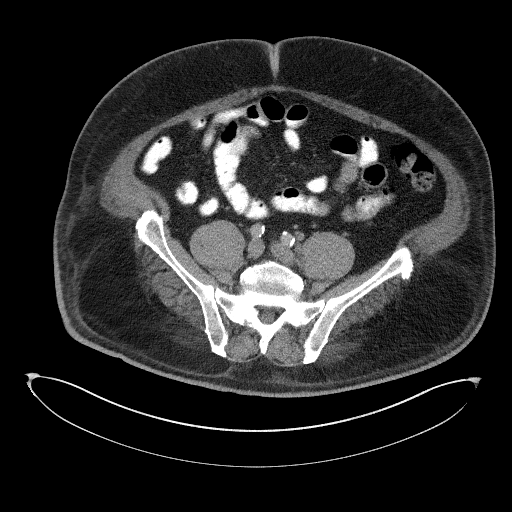
[im 70/75  soft-tissue]
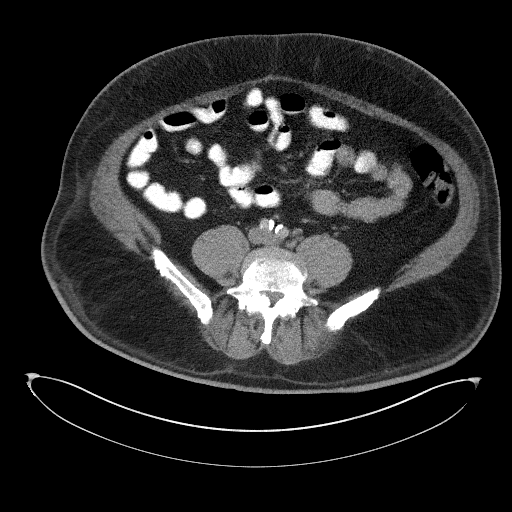

[Series 4: coronal st · coronal · 0.76mm/px · 3 of 124 slices shown]
[im 42/124  soft-tissue]
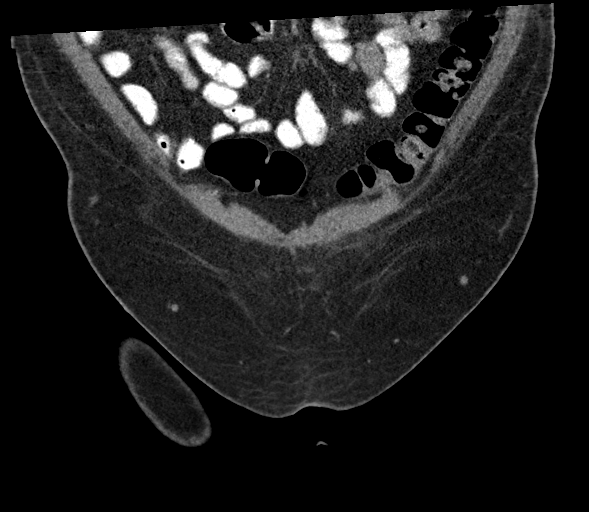
[im 55/124  soft-tissue]
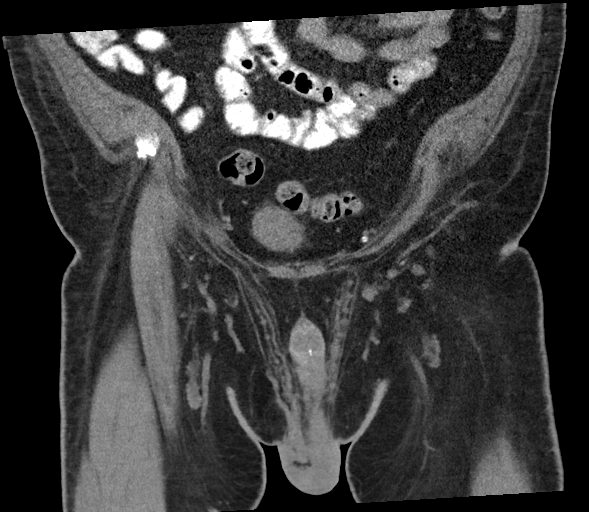
[im 69/124  soft-tissue]
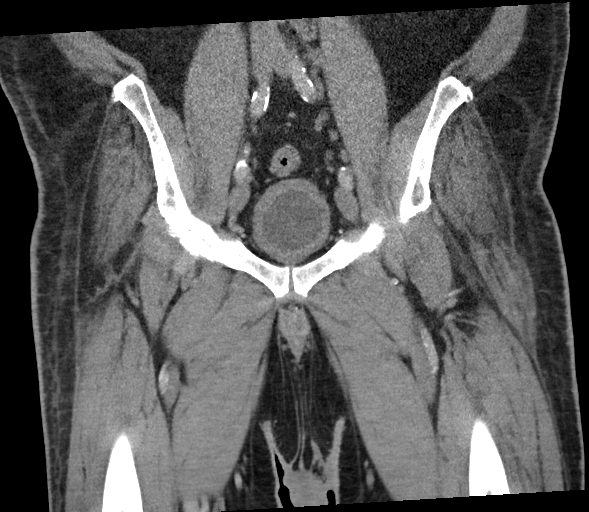

[16 of 46 positions shown; findings below may reference images not displayed]

FINDINGS: Urinary Tract: Urinary bladder diffuse mild wall thickening probably
from nondistention, cystitis is not entirely excluded.

Bowel: No specific mass or bowel abnormality is identified in the
pelvis

Vascular/Lymphatic: Atherosclerosis is present, including aortoiliac
atherosclerotic disease. Scattered pelvic lymph nodes include a
cm left periaortic node with fatty hilum on image 1 of series 2; a
0.8 cm left common iliac node on image 7 of series 2; and a 1.0 cm
right common iliac node on image 31 of series 2 which has a fatty
hilum. The 1.0 cm lymph nodes are borderline in size but the fatty
hila are somewhat reassuring. No enlarged perirectal or superior
rectal adenopathy is identified.

Reproductive:  Unremarkable

Other:  No supplemental non-categorized findings.

Musculoskeletal: There is substantial lumbar impingement at the L4-5
level which is poorly characterized on today's CT pelvis but which
likely includes both bilateral foraminal stenosis and central
narrowing of the thecal sac.
IMPRESSION: 1. No overt pathologic adenopathy is identified. There is a
borderline prominent left periaortic lymph node and a borderline
prominent right external iliac lymph node, both of which have large
fatty hila.
2. Substantial lumbar impingement at L4-5.
3. Atherosclerosis.  Aortic Atherosclerosis ([LX]-[LX]).
4. Mild urinary bladder diffuse wall thickening is probably from
nondistention.

## 2021-10-16 MED ORDER — IOHEXOL 300 MG/ML  SOLN
100.0000 mL | Freq: Once | INTRAMUSCULAR | Status: AC | PRN
  Administered 2021-10-16: 80 mL via INTRAVENOUS

## 2021-10-19 ENCOUNTER — Encounter (HOSPITAL_COMMUNITY): Payer: Self-pay | Admitting: Emergency Medicine

## 2021-10-19 ENCOUNTER — Emergency Department (HOSPITAL_COMMUNITY)
Admission: EM | Admit: 2021-10-19 | Discharge: 2021-10-20 | Disposition: A | Discharge status: Skilled Nursing Facility | Payer: Medicare Other | Attending: Emergency Medicine | Admitting: Emergency Medicine

## 2021-10-19 ENCOUNTER — Other Ambulatory Visit: Payer: Self-pay

## 2021-10-19 DIAGNOSIS — R1031 Right lower quadrant pain: Secondary | ICD-10-CM | POA: Insufficient documentation

## 2021-10-19 DIAGNOSIS — Z7982 Long term (current) use of aspirin: Secondary | ICD-10-CM | POA: Insufficient documentation

## 2021-10-19 DIAGNOSIS — Z87891 Personal history of nicotine dependence: Secondary | ICD-10-CM | POA: Insufficient documentation

## 2021-10-19 DIAGNOSIS — Z85048 Personal history of other malignant neoplasm of rectum, rectosigmoid junction, and anus: Secondary | ICD-10-CM | POA: Diagnosis not present

## 2021-10-19 DIAGNOSIS — I10 Essential (primary) hypertension: Secondary | ICD-10-CM | POA: Insufficient documentation

## 2021-10-19 DIAGNOSIS — Z951 Presence of aortocoronary bypass graft: Secondary | ICD-10-CM | POA: Insufficient documentation

## 2021-10-19 DIAGNOSIS — I2510 Atherosclerotic heart disease of native coronary artery without angina pectoris: Secondary | ICD-10-CM | POA: Diagnosis not present

## 2021-10-19 DIAGNOSIS — E119 Type 2 diabetes mellitus without complications: Secondary | ICD-10-CM | POA: Insufficient documentation

## 2021-10-19 DIAGNOSIS — N390 Urinary tract infection, site not specified: Secondary | ICD-10-CM

## 2021-10-19 DIAGNOSIS — Z794 Long term (current) use of insulin: Secondary | ICD-10-CM | POA: Insufficient documentation

## 2021-10-19 DIAGNOSIS — Z79899 Other long term (current) drug therapy: Secondary | ICD-10-CM | POA: Insufficient documentation

## 2021-10-19 HISTORY — DX: Malignant (primary) neoplasm, unspecified: C80.1

## 2021-10-19 LAB — CBC WITH DIFFERENTIAL/PLATELET
Abs Immature Granulocytes: 0.21 10*3/uL — ABNORMAL HIGH (ref 0.00–0.07)
Basophils Absolute: 0.1 10*3/uL (ref 0.0–0.1)
Basophils Relative: 1 %
Eosinophils Absolute: 0.5 10*3/uL (ref 0.0–0.5)
Eosinophils Relative: 5 %
HCT: 35.4 % — ABNORMAL LOW (ref 39.0–52.0)
Hemoglobin: 11.4 g/dL — ABNORMAL LOW (ref 13.0–17.0)
Immature Granulocytes: 2 %
Lymphocytes Relative: 15 %
Lymphs Abs: 1.6 10*3/uL (ref 0.7–4.0)
MCH: 29 pg (ref 26.0–34.0)
MCHC: 32.2 g/dL (ref 30.0–36.0)
MCV: 90.1 fL (ref 80.0–100.0)
Monocytes Absolute: 1.5 10*3/uL — ABNORMAL HIGH (ref 0.1–1.0)
Monocytes Relative: 14 %
Neutro Abs: 6.9 10*3/uL (ref 1.7–7.7)
Neutrophils Relative %: 63 %
Platelets: 282 10*3/uL (ref 150–400)
RBC: 3.93 MIL/uL — ABNORMAL LOW (ref 4.22–5.81)
RDW: 13.9 % (ref 11.5–15.5)
WBC: 10.7 10*3/uL — ABNORMAL HIGH (ref 4.0–10.5)
nRBC: 0 % (ref 0.0–0.2)

## 2021-10-19 LAB — URINALYSIS, MICROSCOPIC (REFLEX)
RBC / HPF: >50 RBC/hpf (ref 0–5)
WBC, UA: >50 WBC/hpf (ref 0–5)

## 2021-10-19 LAB — URINALYSIS, ROUTINE W REFLEX MICROSCOPIC
Bilirubin Urine: NEGATIVE
Glucose, UA: NEGATIVE mg/dL
Ketones, ur: NEGATIVE mg/dL
Nitrite: POSITIVE — AB
Protein, ur: 100 mg/dL — AB
Specific Gravity, Urine: 1.015 (ref 1.005–1.030)
pH: 6.5 (ref 5.0–8.0)

## 2021-10-19 LAB — COMPREHENSIVE METABOLIC PANEL
ALT: 13 U/L (ref 0–44)
AST: 18 U/L (ref 15–41)
Albumin: 3.4 g/dL — ABNORMAL LOW (ref 3.5–5.0)
Alkaline Phosphatase: 108 U/L (ref 38–126)
Anion gap: 11 (ref 5–15)
BUN: 32 mg/dL — ABNORMAL HIGH (ref 6–20)
CO2: 28 mmol/L (ref 22–32)
Calcium: 9 mg/dL (ref 8.9–10.3)
Chloride: 96 mmol/L — ABNORMAL LOW (ref 98–111)
Creatinine, Ser: 1.8 mg/dL — ABNORMAL HIGH (ref 0.61–1.24)
GFR, Estimated: 43 mL/min — ABNORMAL LOW (ref >60)
Glucose, Bld: 103 mg/dL — ABNORMAL HIGH (ref 70–99)
Potassium: 4.1 mmol/L (ref 3.5–5.1)
Sodium: 135 mmol/L (ref 135–145)
Total Bilirubin: 0.5 mg/dL (ref 0.3–1.2)
Total Protein: 7.3 g/dL (ref 6.5–8.1)

## 2021-10-19 LAB — LIPASE, BLOOD: Lipase: 43 U/L (ref 11–51)

## 2021-10-19 MED ORDER — LACTATED RINGERS IV BOLUS
1000.0000 mL | Freq: Once | INTRAVENOUS | Status: AC
  Administered 2021-10-19: 1000 mL via INTRAVENOUS

## 2021-10-19 MED ORDER — CEPHALEXIN 500 MG PO CAPS: 500.0000 mg | ORAL_CAPSULE | Freq: Four times a day (QID) | ORAL | 0 refills | Status: DC

## 2021-10-19 MED ORDER — CEPHALEXIN 500 MG PO CAPS
500.0000 mg | ORAL_CAPSULE | Freq: Once | ORAL | Status: AC
  Administered 2021-10-19: 500 mg via ORAL
  Filled 2021-10-19: qty 1

## 2021-10-19 NOTE — ED Provider Notes (Signed)
[facility] EMERGENCY DEPARTMENT Provider Note  CSN: [id number] Arrival date & time: 10/19/21 1317    History Chief Complaint  Patient presents with   Abdominal Pain    Shane Chambers is a 60 y.o. male with multiple medical problems is currently living at [facility] in [place], he had a colonoscopy a couple of months ago with a polyp removal showing adenocarcinoma. He ahs had Oncology follow up with several subsequent imaging studies which have not definitely evaluated his current staging, most recently was a CT pelvis 3 days ago which showed nonspecific adenopathy. Bladder wall was thickened attributed to decompression. Today he reports sharp RLQ abdominal pain, some R flank pain. States he 'feels like he has a stone stuck' although no renal stone on recent imaging. He has had some urinary frequency, cloudy urine and some hesitancy. He denies any fevers. No N/V/D.    Past Medical History:  Diagnosis Date   CAD (coronary artery disease)    a. s/p CABG in 03/2020 with LIMA-LAD, RIMA-PL, RA-[id number]-OM1   Cancer ([place])    rectal   Cellulitis    Depression    Diabetes mellitus without complication ([place])    Hyperlipidemia 12/01/2019   Hypertension    Ischemic cardiomyopathy    a. EF 20-25% by echo in 02/2020 b. at 40% by echo in 03/2020 c. EF normalized to 60-65% by echo in 04/2020   Peripheral vascular disease ([place])    Sleep apnea    Type 2 diabetes mellitus ([place])     Past Surgical History:  Procedure Laterality Date   BIOPSY  07/18/2021   Procedure: BIOPSY;  Surgeon: Eloise Harman, DO;  [facility];  Service: Endoscopy;;   COLONOSCOPY WITH PROPOFOL N/A 07/18/2021   Procedure: COLONOSCOPY WITH PROPOFOL;  Surgeon: Eloise Harman, DO;  [facility];  Service: Endoscopy;  Laterality: N/A;  8:00am   CORONARY ARTERY BYPASS GRAFT N/A 03/13/2020   Procedure: CORONARY ARTERY BYPASS GRAFTING (CABG) times five using bilateral Internal mammary arteries and  left radial artery;  Surgeon: [name], MD;  Location: [facility];  Service: Open Heart Surgery;  Laterality: N/A;   DENTAL SURGERY     ESOPHAGOGASTRODUODENOSCOPY (EGD) WITH PROPOFOL N/A 07/18/2021   Procedure: ESOPHAGOGASTRODUODENOSCOPY (EGD) WITH PROPOFOL;  Surgeon: Eloise Harman, DO;  [facility];  Service: Endoscopy;  Laterality: N/A;   FLEXIBLE SIGMOIDOSCOPY N/A 08/26/2021   Procedure: FLEXIBLE SIGMOIDOSCOPY;  Surgeon: Eloise Harman, DO;  [facility];  Service: Endoscopy;  Laterality: N/A;  9:00am   POLYPECTOMY  07/18/2021   Procedure: POLYPECTOMY INTESTINAL;  Surgeon: Eloise Harman, DO;  [facility];  Service: Endoscopy;;   RADIAL ARTERY HARVEST Left 03/13/2020   Procedure: [place],;  Surgeon: [name], MD;  Location: [facility];  Service: Open Heart Surgery;  Laterality: Left;   RIGHT/LEFT HEART CATH AND CORONARY ANGIOGRAPHY N/A 03/07/2020   Procedure: RIGHT/LEFT HEART CATH AND CORONARY ANGIOGRAPHY;  Surgeon: Sherren Mocha, MD;  Location: [facility];  Service: Cardiovascular;  Laterality: N/A;   TEE WITHOUT CARDIOVERSION N/A 03/13/2020   Procedure: TRANSESOPHAGEAL ECHOCARDIOGRAM (TEE);  Surgeon: [name], MD;  Location: [facility];  Service: Open Heart Surgery;  Laterality: N/A;    Family History  Problem Relation Age of Onset   Hypertension Mother     Social History   Tobacco Use   Smoking status: Former    Packs/day: 1.50    Types:  Cigarettes    Quit date: 05/25/1995    Years since quitting: 26.4   Smokeless tobacco: Never  Vaping Use   Vaping Use: Never used  Substance Use Topics   Alcohol use: Yes    Comment: rarely   Drug use: No     Home Medications Prior to Admission medications   Medication Sig Start Date End Date Taking? Authorizing Provider  acetaminophen (TYLENOL) 500 MG tablet Take 1,000 mg by mouth every 8 (eight) hours as needed for fever or moderate pain.   Yes [provider]  albuterol (VENTOLIN HFA) 108 (90 Base) MCG/ACT inhaler Inhale 1-2 puffs into the lungs every 6 (six) hours as needed for wheezing or shortness of breath. 05/24/20  Yes Rigoberto Noel, MD  aspirin 81 MG EC tablet Take 1 tablet (81 mg total) by mouth daily with breakfast. 11/29/20  Yes Emokpae, Courage, MD  atorvastatin (LIPITOR) 80 MG tablet Take 80 mg by mouth daily with supper.   Yes [provider]  budesonide-formoterol (SYMBICORT) 160-4.5 MCG/ACT inhaler Inhale 2 puffs into the lungs in the morning and at bedtime. 05/24/20  Yes Rigoberto Noel, MD  Cholecalciferol (VITAMIN D) 50 MCG (2000 UT) tablet Take 2,000 Units by mouth daily.   Yes [provider]  gabapentin (NEURONTIN) 300 MG capsule Take 900 mg by mouth 2 (two) times daily.   Yes [provider]  insulin aspart protamine- aspart (NOVOLOG MIX 70/30) (70-30) 100 UNIT/ML injection Inject 15 Units into the skin 2 (two) times daily with a meal.   Yes [provider]  ketotifen (ZADITOR) 0.025 % ophthalmic solution Place 1 drop into both eyes every morning.   Yes [provider]  levothyroxine (SYNTHROID) 75 MCG tablet Take 75 mcg by mouth daily before breakfast.   Yes [provider]  loperamide HCl (IMODIUM) 2 MG/15ML solution Take 4 mg by mouth every 6 (six) hours as needed for diarrhea or loose stools. 43ml as needed   Yes [provider]  losartan (COZAAR) 50 MG tablet Take 50 mg by mouth daily.   Yes [provider]  magnesium oxide (MAG-OX) 400 MG tablet Take 400 mg by mouth daily.   Yes [provider]  metFORMIN (GLUCOPHAGE) 1000 MG tablet Take 1,000 mg by mouth 2 (two) times daily with a meal.   Yes [provider]  metoprolol succinate (TOPROL-XL) 25 MG 24 hr tablet Take 0.5 tablets (12.5 mg total) by mouth 2 (two) times daily. Patient taking differently: Take 25 mg by mouth 2 (two) times daily. 08/21/20  Yes Verta Ellen., NP   polyethylene glycol (MIRALAX / GLYCOLAX) 17 g packet Take 17 g by mouth daily as needed for moderate constipation.   Yes [provider]  potassium chloride SA (KLOR-CON) 20 MEQ tablet Take 1 tablet (20 mEq total) by mouth daily. 08/21/20  Yes Verta Ellen., NP  tamsulosin (FLOMAX) 0.4 MG CAPS capsule Take 0.8 mg by mouth daily.   Yes [provider]  torsemide (DEMADEX) 20 MG tablet Take 3 tablets (60 mg total) by mouth daily. & 20 mg daily as needed for 3 lb weight gain in 24 hours or 5 lbs in one week 07/25/21  Yes Verta Ellen., NP  traMADol (ULTRAM) 50 MG tablet Take 50 mg by mouth every 6 (six) hours as needed (pain).   Yes [provider]  venlafaxine XR (EFFEXOR-XR) 150 MG 24 hr capsule Take 150 mg by mouth daily  with breakfast.   Yes [provider]  omeprazole (PRILOSEC) 20 MG capsule Take 20 mg by mouth daily before breakfast. Patient not taking: Reported on 10/19/2021    [provider]  oxyCODONE-acetaminophen (PERCOCET/ROXICET) 5-325 MG tablet Take 1-2 tablets by mouth every 8 (eight) hours as needed for severe pain. Patient not taking: Reported on 10/19/2021 07/30/21   Davonna Belling, MD     Allergies    Ace inhibitors, Other, and Penicillins   Review of Systems   Review of Systems A comprehensive review of systems was completed and negative except as noted in HPI.    Physical Exam BP (!) 142/68 (BP Location: Left Arm)   Pulse 62   Temp (!) 97.5 F (36.4 C) (Oral)   Resp (!) 23   Ht 5\' 9"  (1.753 m)   Wt 129.3 kg   SpO2 100%   BMI 42.10 kg/m   Physical Exam Vitals and nursing note reviewed.  Constitutional:      Appearance: Normal appearance.  HENT:     Head: Normocephalic and atraumatic.     Nose: Nose normal.     Mouth/Throat:     Mouth: Mucous membranes are moist.  Eyes:     Extraocular Movements: Extraocular movements intact.     Conjunctiva/sclera: Conjunctivae normal.  Cardiovascular:      Rate and Rhythm: Normal rate.  Pulmonary:     Effort: Pulmonary effort is normal.     Breath sounds: Normal breath sounds.  Abdominal:     General: Abdomen is flat.     Palpations: Abdomen is soft.     Tenderness: There is abdominal tenderness in the right lower quadrant and suprapubic area. There is no guarding. Negative signs include Murphy's sign and McBurney's sign.  Musculoskeletal:        General: No swelling. Normal range of motion.     Cervical back: Neck supple.  Skin:    General: Skin is warm and dry.  Neurological:     General: No focal deficit present.     Mental Status: He is alert.  Psychiatric:        Mood and Affect: Mood normal.     ED Results / Procedures / Treatments   Labs (all labs ordered are listed, but only abnormal results are displayed) Labs Reviewed  COMPREHENSIVE METABOLIC PANEL - Abnormal; Notable for the following components:      Result Value   Chloride 96 (*)    Glucose, Bld 103 (*)    BUN 32 (*)    Creatinine, Ser 1.80 (*)    Albumin 3.4 (*)    GFR, Estimated 43 (*)    All other components within normal limits  CBC WITH DIFFERENTIAL/PLATELET - Abnormal; Notable for the following components:   WBC 10.7 (*)    RBC 3.93 (*)    Hemoglobin 11.4 (*)    HCT 35.4 (*)    Monocytes Absolute 1.5 (*)    Abs Immature Granulocytes 0.21 (*)    All other components within normal limits  LIPASE, BLOOD  URINALYSIS, ROUTINE W REFLEX MICROSCOPIC    EKG None  Radiology No results found.  Procedures Procedures  Medications Ordered in the ED Medications  lactated ringers bolus 1,000 mL (has no administration in time range)     MDM Rules/Calculators/A&P MDM Patient here with nospecific RLQ abdominal pain. Will check labs, including UA. Will hold off on re-imaging given multiple recent scans, no peritoneal signs on exam.   ED Course  I have  reviewed the triage vital signs and the nursing notes.  Pertinent labs & imaging results that were  available during my care of the patient were reviewed by me and considered in my medical decision making (see chart for details).  Clinical Course as of 10/19/21 1508  Sat Oct 19, 2021  1440 CBC with borderline elevated WBC and anemia.  [CS]  6568 CMP with mildly increased Cr from baseline. Will give IVF. Lipase is normal.  [CS]  1275 Care of the patient signed out to Dr. Alvino Chapel at the change of shift.  [CS]    Clinical Course User Index [CS] Truddie Hidden, MD    Final Clinical Impression(s) / ED Diagnoses Final diagnoses:  None    Rx / DC Orders ED Discharge Orders     None        Truddie Hidden, MD 10/19/21 [phone]

## 2021-10-19 NOTE — ED Triage Notes (Addendum)
Pt to the ED [facility] with complaints of RLQ abdominal pain. Pt has had so nausea and had diarrhea a week ago.  The pt is from [facility]

## 2021-10-19 NOTE — ED Notes (Signed)
Put pt back in bed after using urinal

## 2021-10-19 NOTE — ED Notes (Signed)
Put pt in gown and 12 lead monitor

## 2021-10-19 NOTE — ED Notes (Signed)
Pt given urinal.

## 2021-10-19 NOTE — ED Notes (Signed)
Freda Munro staff at pt living facility updated and plan to d/c and pt diagnosis

## 2021-10-22 LAB — URINE CULTURE: Culture: >=100000 — AB

## 2021-10-22 NOTE — Progress Notes (Signed)
Shane Chambers, [place] [address]   CLINIC:  Medical Oncology/Hematology  PCP:  System, Provider Not In None None   REASON FOR VISIT:  Follow-up for rectal adenocarcinoma  PRIOR THERAPY: none  CURRENT THERAPY: under work-up  BRIEF ONCOLOGIC HISTORY:  Oncology History   No history exists.    CANCER STAGING:  Cancer Staging  Rectal cancer [facility]) Staging form: Colon and Rectum, AJCC 8th Edition - Clinical stage from 09/03/2021: Stage 0 (cTis, cN0, cM0) - Unsigned   INTERVAL HISTORY:  [name], a 60 y.o. male, returns for routine follow-up of his rectal adenocarcinoma. Fredderick was last seen on 09/17/2021.  Today he reports feeling well.  He did not notice improvement in his energy levels following receiving Feraheme. He is taking Keflex for a current UTI. He reports his urine is cloudy.   REVIEW OF SYSTEMS:  Review of Systems  Constitutional:  Negative for appetite change (80%) and fatigue (60%).  Gastrointestinal:  Positive for constipation and nausea.  Genitourinary:  Positive for difficulty urinating (decreased flow).   Neurological:  Positive for dizziness, headaches and numbness.  All other systems reviewed and are negative.  PAST MEDICAL/SURGICAL HISTORY:  Past Medical History:  Diagnosis Date   CAD (coronary artery disease)    a. s/p CABG in 03/2020 with LIMA-LAD, RIMA-PL, RA-[id number]-OM1   Cancer ([place])    rectal   Cellulitis    Depression    Diabetes mellitus without complication ([place])    Hyperlipidemia 12/01/2019   Hypertension    Ischemic cardiomyopathy    a. EF 20-25% by echo in 02/2020 b. at 40% by echo in 03/2020 c. EF normalized to 60-65% by echo in 04/2020   Peripheral vascular disease ([place])    Sleep apnea    Type 2 diabetes mellitus ([place])    Past Surgical History:  Procedure Laterality Date   BIOPSY  07/18/2021   Procedure: BIOPSY;  Surgeon: Eloise Harman, DO;  [facility];  Service:  Endoscopy;;   COLONOSCOPY WITH PROPOFOL N/A 07/18/2021   Procedure: COLONOSCOPY WITH PROPOFOL;  Surgeon: Eloise Harman, DO;  [facility];  Service: Endoscopy;  Laterality: N/A;  8:00am   CORONARY ARTERY BYPASS GRAFT N/A 03/13/2020   Procedure: CORONARY ARTERY BYPASS GRAFTING (CABG) times five using bilateral Internal mammary arteries and left radial artery;  Surgeon: [name], MD;  Location: [place];  Service: Open Heart Surgery;  Laterality: N/A;   DENTAL SURGERY     ESOPHAGOGASTRODUODENOSCOPY (EGD) WITH PROPOFOL N/A 07/18/2021   Procedure: ESOPHAGOGASTRODUODENOSCOPY (EGD) WITH PROPOFOL;  Surgeon: Eloise Harman, DO;  [facility];  Service: Endoscopy;  Laterality: N/A;   FLEXIBLE SIGMOIDOSCOPY N/A 08/26/2021   Procedure: FLEXIBLE SIGMOIDOSCOPY;  Surgeon: Eloise Harman, DO;  [facility];  Service: Endoscopy;  Laterality: N/A;  9:00am   POLYPECTOMY  07/18/2021   Procedure: POLYPECTOMY INTESTINAL;  Surgeon: Eloise Harman, DO;  [facility];  Service: Endoscopy;;   RADIAL ARTERY HARVEST Left 03/13/2020   Procedure: [place],;  Surgeon: [name], MD;  Location: [place];  Service: Open Heart Surgery;  Laterality: Left;   RIGHT/LEFT HEART CATH AND CORONARY ANGIOGRAPHY N/A 03/07/2020   Procedure: RIGHT/LEFT HEART CATH AND CORONARY ANGIOGRAPHY;  Surgeon: Sherren Mocha, MD;  Location: [place] [facility];  Service: Cardiovascular;  Laterality: N/A;   TEE WITHOUT CARDIOVERSION N/A 03/13/2020   Procedure: TRANSESOPHAGEAL ECHOCARDIOGRAM (TEE);  Surgeon:  [name], MD;  [facility];  Service: Open Heart Surgery;  Laterality: N/A;    SOCIAL HISTORY:  Social History   Socioeconomic History   Marital status: Single    Spouse name: Not on file   Number of children: Not on file   Years of education: Not on file   Highest education level: Not on file  Occupational History   Not on file  Tobacco Use   Smoking status:  Former    Packs/day: 1.50    Types: Cigarettes    Quit date: 05/25/1995    Years since quitting: 26.4   Smokeless tobacco: Never  Vaping Use   Vaping Use: Never used  Substance and Sexual Activity   Alcohol use: Yes    Comment: rarely   Drug use: No   Sexual activity: Not on file  Other Topics Concern   Not on file  Social History Narrative   Not on file   Social Determinants of Health   Financial Resource Strain: Not on file  Food Insecurity: Not on file  Transportation Needs: Not on file  Physical Activity: Not on file  Stress: Not on file  Social Connections: Not on file  Intimate Partner Violence: Not on file    FAMILY HISTORY:  Family History  Problem Relation Age of Onset   Hypertension Mother     CURRENT MEDICATIONS:  Current Outpatient Medications  Medication Sig Dispense Refill   acetaminophen (TYLENOL) 500 MG tablet Take 1,000 mg by mouth every 8 (eight) hours as needed for fever or moderate pain.     albuterol (VENTOLIN HFA) 108 (90 Base) MCG/ACT inhaler Inhale 1-2 puffs into the lungs every 6 (six) hours as needed for wheezing or shortness of breath. 18 g 3   aspirin 81 MG EC tablet Take 1 tablet (81 mg total) by mouth daily with breakfast. 30 tablet 12   atorvastatin (LIPITOR) 80 MG tablet Take 80 mg by mouth daily with supper.     budesonide-formoterol (SYMBICORT) 160-4.5 MCG/ACT inhaler Inhale 2 puffs into the lungs in the morning and at bedtime. 1 Inhaler 6   cephALEXin (KEFLEX) 500 MG capsule Take 1 capsule (500 mg total) by mouth 4 (four) times daily. 20 capsule 0   Cholecalciferol (VITAMIN D) 50 MCG (2000 UT) tablet Take 2,000 Units by mouth daily.     gabapentin (NEURONTIN) 300 MG capsule Take 900 mg by mouth 2 (two) times daily.     insulin aspart protamine- aspart (NOVOLOG MIX 70/30) (70-30) 100 UNIT/ML injection Inject 15 Units into the skin 2 (two) times daily with a meal.     ketotifen (ZADITOR) 0.025 % ophthalmic solution Place 1 drop into both  eyes every morning.     levothyroxine (SYNTHROID) 75 MCG tablet Take 75 mcg by mouth daily before breakfast.     loperamide HCl (IMODIUM) 2 MG/15ML solution Take 4 mg by mouth every 6 (six) hours as needed for diarrhea or loose stools. 28m as needed     losartan (COZAAR) 50 MG tablet Take 50 mg by mouth daily.     magnesium oxide (MAG-OX) 400 MG tablet Take 400 mg by mouth daily.     metFORMIN (GLUCOPHAGE) 1000 MG tablet Take 1,000 mg by mouth 2 (two) times daily with a meal.     metoprolol succinate (TOPROL-XL) 25 MG 24 hr tablet Take 0.5 tablets (12.5 mg total) by mouth 2 (two) times daily. (Patient taking differently: Take 25 mg by mouth 2 (two) times daily.)  90 tablet 1   omeprazole (PRILOSEC) 20 MG capsule Take 20 mg by mouth daily before breakfast.     oxyCODONE-acetaminophen (PERCOCET/ROXICET) 5-325 MG tablet Take 1-2 tablets by mouth every 8 (eight) hours as needed for severe pain. 6 tablet 0   polyethylene glycol (MIRALAX / GLYCOLAX) 17 g packet Take 17 g by mouth daily as needed for moderate constipation.     potassium chloride SA (KLOR-CON) 20 MEQ tablet Take 1 tablet (20 mEq total) by mouth daily. 90 tablet 1   tamsulosin (FLOMAX) 0.4 MG CAPS capsule Take 0.8 mg by mouth daily.     torsemide (DEMADEX) 20 MG tablet Take 3 tablets (60 mg total) by mouth daily. & 20 mg daily as needed for 3 lb weight gain in 24 hours or 5 lbs in one week 90 tablet 6   traMADol (ULTRAM) 50 MG tablet Take 50 mg by mouth every 6 (six) hours as needed (pain).     venlafaxine XR (EFFEXOR-XR) 150 MG 24 hr capsule Take 150 mg by mouth daily with breakfast.     No current facility-administered medications for this visit.    ALLERGIES:  Allergies  Allergen Reactions   Ace Inhibitors Swelling and Cough    (Not on MAR at [facility] [place])   Other Itching    [name]   Penicillins     Has patient had a PCN reaction causing immediate rash, facial/tongue/throat swelling, SOB or  lightheadedness with hypotension: Unknown Has patient had a PCN reaction causing severe rash involving mucus membranes or skin necrosis: Unknown Has patient had a PCN reaction that required hospitalization: Unknown Has patient had a PCN reaction occurring within the last 10 years: Unknown If all of the above answers are "NO", then may proceed with Cephalosporin use.     PHYSICAL EXAM:  Performance status (ECOG): 2 - Symptomatic, <50% confined to bed  Vitals:   10/23/21 1112  BP: (!) 169/85  Pulse: 64  Resp: 18  Temp: 97.9 F (36.6 C)  SpO2: 96%   Wt Readings from Last 3 Encounters:  10/23/21 257 lb 15 oz (117 kg)  10/19/21 285 lb 0.9 oz (129.3 kg)  08/13/21 285 lb (129.3 kg)   Physical Exam Vitals reviewed.  Constitutional:      Appearance: Normal appearance.     Comments: In wheelchair  Cardiovascular:     Rate and Rhythm: Normal rate and regular rhythm.     Pulses: Normal pulses.     Heart sounds: Normal heart sounds.  Pulmonary:     Effort: Pulmonary effort is normal.     Breath sounds: Normal breath sounds.  Neurological:     General: No focal deficit present.     Mental Status: He is alert and oriented to person, place, and time.  Psychiatric:        Mood and Affect: Mood normal.        Behavior: Behavior normal.     LABORATORY DATA:  I have reviewed the labs as listed.  CBC Latest Ref Rng & Units 10/19/2021 09/03/2021 07/30/2021  WBC 4.0 - 10.5 K/uL 10.7(H) 11.6(H) 15.5(H)  Hemoglobin 13.0 - 17.0 g/dL 11.4(L) 11.7(L) 11.4(L)  Hematocrit 39.0 - 52.0 % 35.4(L) 37.7(L) 36.7(L)  Platelets 150 - 400 K/uL 282 301 386   CMP Latest Ref Rng & Units 10/19/2021 10/16/2021 09/03/2021  Glucose 70 - 99 mg/dL 103(H) - 79  BUN 6 - 20 mg/dL 32(H) - 23(H)  Creatinine 0.61 - 1.24 mg/dL  1.80(H) 1.60(H) 1.09  Sodium 135 - 145 mmol/L 135 - 138  Potassium 3.5 - 5.1 mmol/L 4.1 - 4.0  Chloride 98 - 111 mmol/L 96(L) - 99  CO2 22 - 32 mmol/L 28 - 30  Calcium 8.9 - 10.3 mg/dL 9.0  - 9.0  Total Protein 6.5 - 8.1 g/dL 7.3 - 7.5  Total Bilirubin 0.3 - 1.2 mg/dL 0.5 - 0.4  Alkaline Phos 38 - 126 U/L 108 - 119  AST 15 - 41 U/L 18 - 22  ALT 0 - 44 U/L 13 - 14    DIAGNOSTIC IMAGING:  I have independently reviewed the scans and discussed with the patient. CT Pelvis W Contrast  Result Date: 10/17/2021 CLINICAL DATA:  Colorectal cancer. Pelvic MRI complicated by motion artifact during staging. EXAM: CT PELVIS WITH CONTRAST TECHNIQUE: Multidetector CT imaging of the pelvis was performed using the standard protocol following the bolus administration of intravenous contrast. CONTRAST:  50m OMNIPAQUE IOHEXOL 300 MG/ML  SOLN COMPARISON:  Overlapping portion is CT abdomen 09/12/2021 FINDINGS: Urinary Tract: Urinary bladder diffuse mild wall thickening probably from nondistention, cystitis is not entirely excluded. Bowel: No specific mass or bowel abnormality is identified in the pelvis Vascular/Lymphatic: Atherosclerosis is present, including aortoiliac atherosclerotic disease. Scattered pelvic lymph nodes include a 1.0 cm left periaortic node with fatty hilum on image 1 of series 2; a 0.8 cm left common iliac node on image 7 of series 2; and a 1.0 cm right common iliac node on image 31 of series 2 which has a fatty hilum. The 1.0 cm lymph nodes are borderline in size but the fatty hila are somewhat reassuring. No enlarged perirectal or superior rectal adenopathy is identified. Reproductive:  Unremarkable Other:  No supplemental non-categorized findings. Musculoskeletal: There is substantial lumbar impingement at the L4-5 level which is poorly characterized on today's CT pelvis but which likely includes both bilateral foraminal stenosis and central narrowing of the thecal sac. IMPRESSION: 1. No overt pathologic adenopathy is identified. There is a borderline prominent left periaortic lymph node and a borderline prominent right external iliac lymph node, both of which have large fatty hila. 2.  Substantial lumbar impingement at L4-5. 3. Atherosclerosis.  Aortic Atherosclerosis (ICD10-I70.0). 4. Mild urinary bladder diffuse wall thickening is probably from nondistention. Electronically Signed   By: WVan ClinesM.D.   On: 10/17/2021 14:59     ASSESSMENT:  Rectal adenocarcinoma arising in a polyp: - Colonoscopy on 07/18/2021 with 15 mm sessile polyp in the sigmoid colon removed with hot snare, 5 mm sessile polyp in the sigmoid colon removed. - Pathology of the sigmoid polypectomy shows invasive colonic adenocarcinoma involving a tubular adenoma.  Carcinoma invades to a depth of 2 mm.  Carcinoma is 1 mm from the margin.  No lymphovascular invasion.  No poorly differentiated component. - Flexible sigmoidoscopy on 08/26/2021 with 15 mm ulcer from previous polypectomy found in the rectum.  This is in the rectum approximately 5 to 8 cm from the anal verge. - CT chest and abdomen with contrast from 09/12/2021 with no sign of metastatic disease in the chest.  Top normal size of left parotic lymph node measuring 10 to 12 mm but with fatty hila, nonspecific. - MRI of the pelvis without contrast from 09/12/2021 was suboptimal due to rectal spasm and gross patient motion which limits assessment.  Site of biopsy not visible and not well assessed.  No gross extracolonic soft tissue.  Potential left meso rectal lymph node within 1 to 2 mm  of mesorectal fascia.   Social/family history: - He is a retired Marine scientist.  He works for [employer]. - He quit smoking in 1996. - He is currently residing at [facility] in [place] since January 2022.  He was originally admitted there for rehab after cellulitis.  He reports that he will soon be discharged to a community apartment. - Maternal great aunt had breast cancer.  No other malignancies.   PLAN:  Rectal adenocarcinoma and a polyp, MSI-stable, MMR preserved: - Based on the gross description of the pathology, it appears to be a fragmented specimen. -  We reviewed CT pelvis from 10/17/2021.  Scattered pelvic lymph nodes includes 1.0 cm left para-aortic lymph node with fatty hilum.  0.8 cm left common iliac lymph node and 1 cm right common iliac lymph node which showed fatty hilum.  These lymph nodes are borderline in size but fatty hila are somewhat reassuring.  No enlarged irregular superior rectal adenopathy. - I have recommended surgical evaluation at this time.  I have also reviewed pathology reports which showed MSI stable. - We will reevaluate him in 4 weeks.  2.  Normocytic anemia: - His ferritin was low at 30 and percent saturation 12. - He received Feraheme on 09/19/2021 and 09/26/2021 without any reactions. - His energy levels have not improved significantly. - Reviewed CBC from 10/19/2021 with hemoglobin 11.4. - Will consider repeating ferritin and iron panel in 4 to 6 weeks.   Orders placed this encounter:  No orders of the defined types were placed in this encounter.    Derek Jack, MD [place] [phone]   I, Thana Ates, am acting as a scribe for Dr. Derek Jack.  I, Derek Jack MD, have reviewed the above documentation for accuracy and completeness, and I agree with the above.

## 2021-10-23 ENCOUNTER — Inpatient Hospital Stay (HOSPITAL_COMMUNITY): Payer: Medicare Other | Attending: Hematology | Admitting: Hematology

## 2021-10-23 ENCOUNTER — Other Ambulatory Visit: Payer: Self-pay

## 2021-10-23 ENCOUNTER — Telehealth: Payer: Self-pay | Admitting: *Deleted

## 2021-10-23 ENCOUNTER — Encounter (HOSPITAL_COMMUNITY): Payer: Self-pay | Admitting: Hematology

## 2021-10-23 VITALS — BP 169/85 | HR 64 | Temp 97.9°F | Resp 18 | Ht 70.0 in | Wt 257.9 lb

## 2021-10-23 DIAGNOSIS — Z803 Family history of malignant neoplasm of breast: Secondary | ICD-10-CM | POA: Diagnosis not present

## 2021-10-23 DIAGNOSIS — Z79899 Other long term (current) drug therapy: Secondary | ICD-10-CM | POA: Insufficient documentation

## 2021-10-23 DIAGNOSIS — D649 Anemia, unspecified: Secondary | ICD-10-CM | POA: Insufficient documentation

## 2021-10-23 DIAGNOSIS — C20 Malignant neoplasm of rectum: Secondary | ICD-10-CM | POA: Insufficient documentation

## 2021-10-23 DIAGNOSIS — D509 Iron deficiency anemia, unspecified: Secondary | ICD-10-CM

## 2021-10-23 MED ORDER — CIPROFLOXACIN HCL 500 MG PO TABS: 500.0000 mg | ORAL_TABLET | Freq: Two times a day (BID) | ORAL | 0 refills | Status: DC

## 2021-10-23 NOTE — Telephone Encounter (Signed)
Post ED Visit - Positive Culture Follow-up  Culture report reviewed by antimicrobial stewardship pharmacist: [place] Team []  Nathan Batchelder, Pharm.D. []  [address], Pharm.D., BCPS AQ-ID []  Heide Guile, Pharm.D., BCPS []  Parks Neptune, Pharm.D., BCPS []  [place], Pharm.D., BCPS, AAHIVP []  [place], Pharm.D., BCPS, AAHIVP []  Legrand Como, PharmD, BCPS []  Salome Arnt, PharmD, BCPS []  Johnnette Gourd, PharmD, BCPS []  Hughes Better, PharmD []  Leeroy Cha, PharmD, BCPS []  Laqueta Linden, PharmD  [place] Team []  [address], PharmD []  Leodis Sias, PharmD []  Lindell Spar, PharmD []  Royetta Asal, Rph []  Graylin Shiver) Rema Fendt, PharmD []  [place]ennon Mac, PharmD []  Arlyn Dunning, PharmD []  Netta Cedars, PharmD []  Dia Sitter, PharmD []  Leone Haven, PharmD []  Gretta Arab, PharmD []  Theodis Shove, PharmD []  Peggyann Juba, PharmD   Positive urine culture Treated with Cephalexin, organism sensitive to the same and no further patient follow-up is required at this time.  Reuel Boom, PharmD  Joetta Manners [place] 10/23/2021, 8:17 AM

## 2021-10-23 NOTE — Patient Instructions (Signed)
[place] at [facility] Discharge Instructions   You were seen and examined today by Dr. Delton Coombes.  He discussed the results of your CT scan.  We will refer you to Dr. Arnoldo Morale for possible surgery to remove part of the colon for staging purposes.   Return as scheduled in 4 weeks.     Thank you for choosing [place] at [facility] to provide your oncology and hematology care.  To afford each patient quality time with our provider, please arrive at least 15 minutes before your scheduled appointment time.   If you have a lab appointment with the [facility] please come in thru the Main Entrance and check in at the main information desk.  You need to re-schedule your appointment should you arrive 10 or more minutes late.  We strive to give you quality time with our providers, and arriving late affects you and other patients whose appointments are after yours.  Also, if you no show three or more times for appointments you may be dismissed from the clinic at the providers discretion.     Again, thank you for choosing [facility].  Our hope is that these requests will decrease the amount of time that you wait before being seen by our physicians.       _____________________________________________________________  Should you have questions after your visit to [facility], please contact our office at [phone] and follow the prompts.  Our office hours are 8:00 a.m. and 4:30 p.m. Monday - Friday.  Please note that voicemails left after 4:00 p.m. may not be returned until the following business day.  We are closed weekends and major holidays.  You do have access to a nurse 24-7, just call the main number to the clinic [phone] and do not press any options, hold on the line and a nurse will answer the phone.    For prescription refill requests, have your pharmacy contact our office and allow 72 hours.    Due to  Covid, you will need to wear a mask upon entering the hospital. If you do not have a mask, a mask will be given to you at the Main Entrance upon arrival. For doctor visits, patients may have 1 support person age 6 or older with them. For treatment visits, patients can not have anyone with them due to social distancing guidelines and our immunocompromised population.

## 2021-10-24 ENCOUNTER — Telehealth (HOSPITAL_COMMUNITY): Payer: Self-pay | Admitting: *Deleted

## 2021-10-24 NOTE — Telephone Encounter (Signed)
Received TC from [place] at [facility] and rehab to clarify Cipro dose and instructions as ordered yesterday by Dr Delton Coombes and stated that he would be started on regimen today.

## 2021-11-07 ENCOUNTER — Ambulatory Visit (INDEPENDENT_AMBULATORY_CARE_PROVIDER_SITE_OTHER): Payer: Medicare Other | Admitting: General Surgery

## 2021-11-07 ENCOUNTER — Other Ambulatory Visit: Payer: Self-pay

## 2021-11-07 ENCOUNTER — Encounter: Payer: Self-pay | Admitting: General Surgery

## 2021-11-07 VITALS — BP 137/84 | HR 65 | Temp 98.6°F | Resp 18

## 2021-11-07 DIAGNOSIS — C20 Malignant neoplasm of rectum: Secondary | ICD-10-CM

## 2021-11-07 DIAGNOSIS — I2510 Atherosclerotic heart disease of native coronary artery without angina pectoris: Secondary | ICD-10-CM

## 2021-11-08 NOTE — Progress Notes (Signed)
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  The patient had a follow-up flexible sigmoidoscopy by Dr. Abbey Chatters of gastroenterology in October 2022 which found a 15 mm ulcer at the previous polypectomy site in the rectum.  This was approximately 5 to 8 cm from the anal verge.  The patient was sent to oncology for further work-up.  CT scan of the chest and abdomen revealed no metastatic disease.  MRI of the pelvis was suboptimal.  A lymph node in the left mesorectal fascia was possibly enlarged.  Iliac nodes were also seen.  Patient is now referred to my care for further evaluation and treatment.  Patient is wheelchair-bound and has limited mobility.  He denies any blood per rectum.  He is living in a nursing home. Past Medical History:  Diagnosis Date   CAD (coronary artery disease)    a. s/p CABG in 03/2020 with LIMA-LAD, RIMA-PL, RA-D1-RI-OM1   Cancer ([place])    rectal   Cellulitis    Depression    Diabetes mellitus without complication ([place])    Hyperlipidemia 12/01/2019   Hypertension    Ischemic cardiomyopathy    a. EF 20-25% by echo in 02/2020 b. at 40% by echo in 03/2020 c. EF normalized to 60-65% by echo in 04/2020   Peripheral vascular disease ([place])    Sleep apnea    Type 2 diabetes mellitus ([place])     Past Surgical History:  Procedure Laterality Date   BIOPSY  07/18/2021   Procedure: BIOPSY;  Surgeon: Eloise Harman, DO;  [facility];  Service: Endoscopy;;   COLONOSCOPY WITH PROPOFOL N/A 07/18/2021   Procedure: COLONOSCOPY WITH PROPOFOL;  Surgeon: Eloise Harman, DO;  [facility];  Service: Endoscopy;  Laterality: N/A;  8:00am   CORONARY ARTERY BYPASS GRAFT N/A 03/13/2020   Procedure: CORONARY ARTERY BYPASS GRAFTING (CABG) times five using bilateral Internal mammary arteries and left  radial artery;  Surgeon: [name], MD;  Location: [place];  Service: Open Heart Surgery;  Laterality: N/A;   DENTAL SURGERY     ESOPHAGOGASTRODUODENOSCOPY (EGD) WITH PROPOFOL N/A 07/18/2021   Procedure: ESOPHAGOGASTRODUODENOSCOPY (EGD) WITH PROPOFOL;  Surgeon: Eloise Harman, DO;  [facility];  Service: Endoscopy;  Laterality: N/A;   FLEXIBLE SIGMOIDOSCOPY N/A 08/26/2021   Procedure: FLEXIBLE SIGMOIDOSCOPY;  Surgeon: Eloise Harman, DO;  [facility];  Service: Endoscopy;  Laterality: N/A;  9:00am   POLYPECTOMY  07/18/2021   Procedure: POLYPECTOMY INTESTINAL;  Surgeon: Eloise Harman, DO;  [facility];  Service: Endoscopy;;   RADIAL ARTERY HARVEST Left 03/13/2020   Procedure: [place],;  Surgeon: [name], MD;  Location: [place];  Service: Open Heart Surgery;  Laterality: Left;   RIGHT/LEFT HEART CATH AND CORONARY ANGIOGRAPHY N/A 03/07/2020   Procedure: RIGHT/LEFT HEART CATH AND CORONARY ANGIOGRAPHY;  Surgeon: Sherren Mocha, MD;  Location: [place] [facility];  Service: Cardiovascular;  Laterality: N/A;   TEE WITHOUT CARDIOVERSION N/A 03/13/2020   Procedure: TRANSESOPHAGEAL ECHOCARDIOGRAM (TEE);  Surgeon: [name], MD;  Location: [place];  Service: Open Heart Surgery;  Laterality: N/A;    Family History  Problem Relation Age of Onset   Hypertension Mother     Current Outpatient Medications on File Prior to Visit  Medication Sig Dispense Refill  acetaminophen (TYLENOL) 500 MG tablet Take 1,000 mg by mouth every 8 (eight) hours as needed for fever or moderate pain.     albuterol (VENTOLIN HFA) 108 (90 Base) MCG/ACT inhaler Inhale 1-2 puffs into the lungs every 6 (six) hours as needed for wheezing or shortness of breath. 18 g 3   aspirin 81 MG EC tablet Take 1 tablet (81 mg total) by mouth daily with breakfast. 30 tablet 12   atorvastatin (LIPITOR) 80 MG tablet Take 80 mg by mouth daily with supper.      budesonide-formoterol (SYMBICORT) 160-4.5 MCG/ACT inhaler Inhale 2 puffs into the lungs in the morning and at bedtime. 1 Inhaler 6   Cholecalciferol (VITAMIN D) 50 MCG (2000 UT) tablet Take 2,000 Units by mouth daily.     gabapentin (NEURONTIN) 300 MG capsule Take 900 mg by mouth 2 (two) times daily.     insulin aspart protamine- aspart (NOVOLOG MIX 70/30) (70-30) 100 UNIT/ML injection Inject 15 Units into the skin 2 (two) times daily with a meal.     ketotifen (ZADITOR) 0.025 % ophthalmic solution Place 1 drop into both eyes every morning.     levothyroxine (SYNTHROID) 75 MCG tablet Take 75 mcg by mouth daily before breakfast.     loperamide HCl (IMODIUM) 2 MG/15ML solution Take 4 mg by mouth every 6 (six) hours as needed for diarrhea or loose stools. 23ml as needed     losartan (COZAAR) 50 MG tablet Take 50 mg by mouth daily.     magnesium oxide (MAG-OX) 400 MG tablet Take 400 mg by mouth daily.     metFORMIN (GLUCOPHAGE) 1000 MG tablet Take 1,000 mg by mouth 2 (two) times daily with a meal.     metoprolol succinate (TOPROL-XL) 25 MG 24 hr tablet Take 0.5 tablets (12.5 mg total) by mouth 2 (two) times daily. (Patient taking differently: Take 25 mg by mouth 2 (two) times daily.) 90 tablet 1   omeprazole (PRILOSEC) 20 MG capsule Take 20 mg by mouth daily before breakfast.     oxyCODONE-acetaminophen (PERCOCET/ROXICET) 5-325 MG tablet Take 1-2 tablets by mouth every 8 (eight) hours as needed for severe pain. 6 tablet 0   polyethylene glycol (MIRALAX / GLYCOLAX) 17 g packet Take 17 g by mouth daily as needed for moderate constipation.     potassium chloride SA (KLOR-CON) 20 MEQ tablet Take 1 tablet (20 mEq total) by mouth daily. 90 tablet 1   tamsulosin (FLOMAX) 0.4 MG CAPS capsule Take 0.8 mg by mouth daily.     torsemide (DEMADEX) 20 MG tablet Take 3 tablets (60 mg total) by mouth daily. & 20 mg daily as needed for 3 lb weight gain in 24 hours or 5 lbs in one week 90 tablet 6   traMADol (ULTRAM) 50  MG tablet Take 50 mg by mouth every 6 (six) hours as needed (pain).     venlafaxine XR (EFFEXOR-XR) 150 MG 24 hr capsule Take 150 mg by mouth daily with breakfast.     No current facility-administered medications on file prior to visit.    Allergies  Allergen Reactions   Ace Inhibitors Swelling and Cough    (Not on MAR at [facility] and [place])   Other Itching    [name]   Penicillins     Has patient had a PCN reaction causing immediate rash, facial/tongue/throat swelling, SOB or lightheadedness with hypotension: Unknown Has patient had a PCN reaction causing severe rash involving mucus membranes or skin necrosis:  Unknown Has patient had a PCN reaction that required hospitalization: Unknown Has patient had a PCN reaction occurring within the last 10 years: Unknown If all of the above answers are "NO", then may proceed with Cephalosporin use.     Social History   Substance and Sexual Activity  Alcohol Use Yes   Comment: rarely    Social History   Tobacco Use  Smoking Status Former   Packs/day: 1.50   Types: Cigarettes   Quit date: 05/25/1995   Years since quitting: 26.4  Smokeless Tobacco Never    Review of Systems  Constitutional:  Positive for malaise/fatigue.  HENT: Negative.    Eyes: Negative.   Respiratory: Negative.    Cardiovascular: Negative.   Gastrointestinal: Negative.   Genitourinary: Negative.   Musculoskeletal: Negative.        Ambulates with difficulty due to arthritis  Skin: Negative.   Neurological: Negative.   Endo/Heme/Allergies: Negative.   Psychiatric/Behavioral: Negative.     Objective   Vitals:   11/07/21 1037  BP: 137/84  Pulse: 65  Resp: 18  Temp: 98.6 F (37 C)  SpO2: 95%    Physical Exam Vitals reviewed.  Constitutional:      Appearance: Normal appearance. He is obese. He is not ill-appearing.     Comments: Patient primarily stays in the wheelchair and has limited ability to transfer himself to the  examination table.  HENT:     Head: Normocephalic and atraumatic.  Cardiovascular:     Rate and Rhythm: Normal rate and regular rhythm.     Heart sounds: Normal heart sounds. No murmur heard.   No friction rub. No gallop.  Pulmonary:     Effort: Pulmonary effort is normal. No respiratory distress.     Breath sounds: Normal breath sounds. No stridor. No wheezing, rhonchi or rales.  Abdominal:     General: There is no distension.     Palpations: Abdomen is soft. There is no mass.     Tenderness: There is no abdominal tenderness. There is no guarding or rebound.     Hernia: No hernia is present.     Comments: Very large abdomen.  Skin:    General: Skin is warm and dry.  Neurological:     Mental Status: He is alert and oriented to person, place, and time.  Oncology notes reviewed.  Dr. Ave Filter notes reviewed.  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 Plan  Patient probably needs EUS for further evaluation of the perirectal lymph nodes.  Patient is a high risk surgical candidate for any open resection.  Would need to have surgery performed at a tertiary care center due to his multiple comorbidities.  Patient may be a candidate for transanal resection of the cancer.  I do not perform this procedure.  Will refer to colorectal surgery at [facility] for further evaluation and treatment.

## 2021-11-13 ENCOUNTER — Other Ambulatory Visit: Payer: Self-pay | Admitting: *Deleted

## 2021-11-13 DIAGNOSIS — C20 Malignant neoplasm of rectum: Secondary | ICD-10-CM

## 2021-11-25 ENCOUNTER — Ambulatory Visit (HOSPITAL_COMMUNITY): Payer: Medicare Other | Admitting: Hematology

## 2021-12-16 ENCOUNTER — Telehealth: Payer: Self-pay | Admitting: *Deleted

## 2021-12-16 ENCOUNTER — Encounter: Payer: Self-pay | Admitting: *Deleted

## 2021-12-16 ENCOUNTER — Encounter: Payer: Self-pay | Admitting: Gastroenterology

## 2021-12-16 ENCOUNTER — Ambulatory Visit (INDEPENDENT_AMBULATORY_CARE_PROVIDER_SITE_OTHER): Payer: Medicare Other | Admitting: Gastroenterology

## 2021-12-16 ENCOUNTER — Other Ambulatory Visit: Payer: Self-pay

## 2021-12-16 DIAGNOSIS — C187 Malignant neoplasm of sigmoid colon: Secondary | ICD-10-CM

## 2021-12-16 DIAGNOSIS — K5900 Constipation, unspecified: Secondary | ICD-10-CM

## 2021-12-16 DIAGNOSIS — K219 Gastro-esophageal reflux disease without esophagitis: Secondary | ICD-10-CM | POA: Diagnosis not present

## 2021-12-16 MED ORDER — OMEPRAZOLE 40 MG PO CPDR: 40.0000 mg | DELAYED_RELEASE_CAPSULE | Freq: Every day | ORAL | 3 refills | Status: DC

## 2021-12-16 NOTE — Patient Instructions (Addendum)
°   Your procedure is scheduled on: 12/19/2021  Report to [facility] at    7:45 AM.  Call this number if you have problems the morning of surgery: [phone]   Remember:              Follow Directions on the letter you received from Your Physician's office regarding the Bowel Prep              No Smoking the day of Procedure :   Take these medicines the morning of surgery with A SIP OF WATER: Levothyroxine, Metoprolol, Flomax, Tramadol. And Effexor  No diabetic medications am of procedure   Do not wear jewelry, make-up or nail polish.    Do not bring valuables to the hospital.  Contacts, dentures or bridgework may not be worn into surgery.  .   Patients discharged the day of surgery will not be allowed to drive home.     Sigmoidoscopy, Adult, Care After This sheet gives you information about how to care for yourself after your procedure. Your health care provider may also give you more specific instructions. If you have problems or questions, contact your health care provider. What can I expect after the procedure? After the procedure, it is common to have: A small amount of blood in your stool for 24 hours after the procedure. Some gas. Mild abdominal cramping or bloating.  Follow these instructions at home: General instructions  For the first 24 hours after the procedure: Do not drive or use machinery. Do not sign important documents. Do not drink alcohol. Do your regular daily activities at a slower pace than normal. Eat soft, easy-to-digest foods. Rest often. Take over-the-counter or prescription medicines only as told by your health care provider. It is up to you to get the results of your procedure. Ask your health care provider, or the department performing the procedure, when your results will be ready. Relieving cramping and bloating Try walking around when you have cramps or feel bloated. Apply heat to your abdomen as told by your health care  provider. Use a heat source that your health care provider recommends, such as a moist heat pack or a heating pad. Place a towel between your skin and the heat source. Leave the heat on for 20-30 minutes. Remove the heat if your skin turns bright red. This is especially important if you are unable to feel pain, heat, or cold. You may have a greater risk of getting burned. Eating and drinking Drink enough fluid to keep your urine clear or pale yellow. Resume your normal diet as instructed by your health care provider. Avoid heavy or fried foods that are hard to digest. Avoid drinking alcohol for as long as instructed by your health care provider. Contact a health care provider if: You have blood in your stool 2-3 days after the procedure. Get help right away if: You have more than a small spotting of blood in your stool. You pass large blood clots in your stool. Your abdomen is swollen. You have nausea or vomiting. You have a fever. You have increasing abdominal pain that is not relieved with medicine. This information is not intended to replace advice given to you by your health care provider. Make sure you discuss any questions you have with your health care provider. Document Released: 06/10/2004 Document Revised: 07/21/2016 Document Reviewed: 01/08/2016 Elsevier Interactive Patient Education  [employer].

## 2021-12-16 NOTE — Telephone Encounter (Signed)
Called facility and spoke with Arbie Cookey. Pt scheduled for procedure flex sig 2/9 st 9:45am. Advised to fax pre-op and prep instructions to her at [phone]

## 2021-12-16 NOTE — Patient Instructions (Signed)
Encourage patient to take a dose of MiraLAX if he has not had a bowel movement within 24 hours.  He should evaluate the need for MiraLAX on a daily basis instead of waiting multiple days without a bowel movement before taking a dose. According to [facility] which accompanied patient today, he is no longer on omeprazole for his acid reflux.  He is having frequent heartburn.  We will add back omeprazole 40 mg daily 30 minutes before breakfast. Arrange for flexible sigmoidoscopy in the near future.

## 2021-12-16 NOTE — H&P (View-Only) (Signed)
Primary Care Physician: Caprice Renshaw, MD  Primary Gastroenterologist:  [name]. Abbey Chatters, DO   Chief Complaint  Patient presents with   Constipation    Hasn't had a bm in approx 1 week   Gastroesophageal Reflux    HPI: Shane Chambers is a 61 y.o. male here for urgent follow-up at the request of Dr. Delton Coombes for sigmoidoscopy. Dr. Marcello Moores requesting tattoo placement around the area to help with identification of previous site of cancer.   Patient was diagnosed with rectal cancer found on screening colonoscopy in September 2022.  There are 2 polyps in the sigmoid colon, these were resected and retrieved, one measuring 23mm and the other 94mm.  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 No lymphovascular invasion or poor differentiation.  He underwent a follow-up flexible sigmoidoscopy in October for purpose of tattooing the site. The polypectomy site had 46mm ulcer, noted at approximately 5-8cm from anal verge. No biopsies were taken. Based on gross description of the pathology, Dr. Delton Coombes was concerned that it may be a fragmented specimen. CT scan of the chest/abdomen/pelvis showed no signs of metastatic disease. Top normal size of left para-aortic lymph node measuring 10-73mm, but with fatty hila, nonspecific. MRI pelvis without contrast, 09/12/21 showed rectal spasm and gross patient motion limits assessment. Site of biopsy under lesion not visible and not well assessed. No gross extracolonic soft tissue.  Potential left miso rectal lymph node within 1 to 2 mm of mesorectal fascia. CT pelvis with contrast ordered, no overt pathologic adenopathy identified.  Borderline prominent left.  Aortic lymph node and a borderline prominent right external iliac lymph node but both with large fatty hila.   He has been seen by Dr. Leighton Ruff, general surgery, on January 10.   After her evaluation with the pathologist, it was felt that the rectal cancer was superficial in nature and recommended following the area endoscopically for potential recurrence but no upfront surgery recommended.  Patient felt to be a poor candidate for low anterior resection but could be offered in the future if needed with a permanent colostomy. She had pathology to relook at slides, felt like cancer was superficial and advises no surgery needed at this time but would need surveillance endoscopically.      Patient currently resides at [facility] and [facility] for rehabilitation after cellulitis.  Notes that he was living with his mother but it was deemed that her house was unsafe for him.  Back in August 2022 he was set up in a vocational rehabilitation program and they were supposed to be helping him find housing but he has since ended up in SNF as outlined.  Patient denies any abdominal pain or rectal pain.  Chronically his bowel movements tend to alternate between constipation and diarrhea but more so lately he has been constipated.  He has MiraLAX available but he tends to wait for 5 days without having a bowel movement before he starts it.  Never been 1 to have a daily BM.  He does not feel like he is impacted.  Denies any rectal pressure or pain.  Difficult to evaluate patient today as he is in a wheelchair and has limited mobility.  He denies any melena or rectal bleeding.  His appetite is okay.  He is noted increased issues with heartburn and upon evaluation of his MAR which was printed  today and accompanies the patient, he is no longer on acid reflux medication.  Previously did well on omeprazole 40 mg daily.  He is concerned about all the different opinions he has received regarding his cancer.  He feels like nothing is being done and is confused by recommendations for surgical resection but now surgeon states he does not need to have surgery done and to continue to follow the area  endoscopically.  Patient is concerned he is going to end up with metastatic disease.  Dr. Delton Coombes also managing his normocytic anemia.  Recently received iron infusions in November for ferritin of 30, iron saturations 12%.  Follow-up labs planned.    Current Outpatient Medications  Medication Sig Dispense Refill   acetaminophen (TYLENOL) 500 MG tablet Take 1,000 mg by mouth every 8 (eight) hours as needed for fever or moderate pain.     albuterol (VENTOLIN HFA) 108 (90 Base) MCG/ACT inhaler Inhale 1-2 puffs into the lungs every 6 (six) hours as needed for wheezing or shortness of breath. 18 g 3   amitriptyline (ELAVIL) 10 MG tablet Take 10 mg by mouth at bedtime.     aspirin 81 MG EC tablet Take 1 tablet (81 mg total) by mouth daily with breakfast. 30 tablet 12   atorvastatin (LIPITOR) 80 MG tablet Take 80 mg by mouth daily with supper.     budesonide-formoterol (SYMBICORT) 160-4.5 MCG/ACT inhaler Inhale 2 puffs into the lungs in the morning and at bedtime. 1 Inhaler 6   Cholecalciferol (VITAMIN D) 50 MCG (2000 UT) tablet Take 2,000 Units by mouth daily.     gabapentin (NEURONTIN) 300 MG capsule Take 900 mg by mouth 2 (two) times daily.     insulin aspart protamine- aspart (NOVOLOG MIX 70/30) (70-30) 100 UNIT/ML injection Inject 15 Units into the skin 2 (two) times daily with a meal.     levothyroxine (SYNTHROID) 75 MCG tablet Take 75 mcg by mouth daily before breakfast.     loperamide HCl (IMODIUM) 2 MG/15ML solution Take by mouth every 6 (six) hours as needed for diarrhea or loose stools. 47ml as needed     losartan (COZAAR) 50 MG tablet Take 50 mg by mouth daily.     magnesium oxide (MAG-OX) 400 MG tablet Take 400 mg by mouth daily.     metFORMIN (GLUCOPHAGE) 1000 MG tablet Take 1,000 mg by mouth 2 (two) times daily with a meal.     metoprolol succinate (TOPROL-XL) 25 MG 24 hr tablet Take 0.5 tablets (12.5 mg total) by mouth 2 (two) times daily. (Patient taking differently: Take 25 mg by  mouth 2 (two) times daily.) 90 tablet 1   polyethylene glycol (MIRALAX / GLYCOLAX) 17 g packet Take 17 g by mouth daily as needed for moderate constipation.     polyvinyl alcohol (LIQUIFILM TEARS) 1.4 % ophthalmic solution Place 1 drop into both eyes daily.     potassium chloride SA (KLOR-CON) 20 MEQ tablet Take 1 tablet (20 mEq total) by mouth daily. 90 tablet 1   tamsulosin (FLOMAX) 0.4 MG CAPS capsule Take 0.8 mg by mouth daily.     torsemide (DEMADEX) 20 MG tablet Take 3 tablets (60 mg total) by mouth daily. & 20 mg daily as needed for 3 lb weight gain in 24 hours or 5 lbs in one week 90 tablet 6   traMADol (ULTRAM) 50 MG tablet Take 50 mg by mouth every 6 (six) hours as needed (pain).     venlafaxine XR (EFFEXOR-XR) 150 MG  24 hr capsule Take 150 mg by mouth daily with breakfast.     No current facility-administered medications for this visit.    Allergies as of 12/16/2021 - Review Complete 12/16/2021  Allergen Reaction Noted   Ace inhibitors Swelling and Cough 12/11/2017   Other Itching 11/15/2019   Penicillins  12/11/2017   Past Medical History:  Diagnosis Date   CAD (coronary artery disease)    a. s/p CABG in 03/2020 with LIMA-LAD, RIMA-PL, [id number]   Cancer ([place])    rectal   Cellulitis    Depression    Diabetes mellitus without complication ([place])    Hyperlipidemia 12/01/2019   Hypertension    Ischemic cardiomyopathy    a. EF 20-25% by echo in 02/2020 b. at 40% by echo in 03/2020 c. EF normalized to 60-65% by echo in 04/2020   Peripheral vascular disease ([place])    Sleep apnea    Type 2 diabetes mellitus ([place])    Past Surgical History:  Procedure Laterality Date   BIOPSY  07/18/2021   Procedure: BIOPSY;  Surgeon: Eloise Harman, DO;  [facility];  Service: Endoscopy;;   COLONOSCOPY WITH PROPOFOL N/A 07/18/2021   [place]: 15 millimeter polyp removed from the sigmoid colon, 5 mm polyp removed from sigmoid colon.  Nonbleeding internal hemorrhoids.   Significant looping of the colon. sigmoid path showed invasive colonic adenocarcinoma involving tubular adenoma (invades to depth of 54mm, carcinoma 34mm from margin, no lymphovascular invasion, no poorly differentiated component.   CORONARY ARTERY BYPASS GRAFT N/A 03/13/2020   Procedure: CORONARY ARTERY BYPASS GRAFTING (CABG) times five using bilateral Internal mammary arteries and left radial artery;  Surgeon: [name], MD;  Location: [facility];  Service: Open Heart Surgery;  Laterality: N/A;   DENTAL SURGERY     ESOPHAGOGASTRODUODENOSCOPY (EGD) WITH PROPOFOL N/A 07/18/2021   [place]: 1 gastric polyp status post biopsy, gastritis. gastric bx with slight chronic inflammation and no H.pyori. GEJ polypectomy with mild inflammation only   FLEXIBLE SIGMOIDOSCOPY N/A 08/26/2021   [place]: Nonbleeding internal hemorrhoids.  15 mm ulcers from previous polypectomy found in the rectum.  No evidence of previous polyp.  Located 5 to 8 cm from anal verge.   POLYPECTOMY  07/18/2021   Procedure: POLYPECTOMY INTESTINAL;  Surgeon: Eloise Harman, DO;  [facility];  Service: Endoscopy;;   RADIAL ARTERY HARVEST Left 03/13/2020   Procedure: [place],;  Surgeon: [name], MD;  Location: [facility];  Service: Open Heart Surgery;  Laterality: Left;   RIGHT/LEFT HEART CATH AND CORONARY ANGIOGRAPHY N/A 03/07/2020   Procedure: RIGHT/LEFT HEART CATH AND CORONARY ANGIOGRAPHY;  Surgeon: Sherren Mocha, MD;  Location: [facility];  Service: Cardiovascular;  Laterality: N/A;   TEE WITHOUT CARDIOVERSION N/A 03/13/2020   Procedure: TRANSESOPHAGEAL ECHOCARDIOGRAM (TEE);  Surgeon: [name], MD;  Location: [facility];  Service: Open Heart Surgery;  Laterality: N/A;   Family History  Problem Relation Age of Onset   Hypertension Mother    Social History   Tobacco Use   Smoking status: Former    Packs/day: 1.50    Types: Cigarettes    Quit date: 05/25/1995    Years since quitting:  26.5   Smokeless tobacco: Never  Vaping Use   Vaping Use: Never used  Substance Use Topics   Alcohol use: Not Currently    Comment: rarely   Drug use: No    ROS:  General: Negative for anorexia, weight loss, fever, chills, fatigue,  weakness. ENT: Negative for hoarseness, difficulty swallowing , nasal congestion. CV: Negative for chest pain, angina, palpitations, dyspnea on exertion, ++peripheral edema.  Respiratory: Negative for dyspnea at rest, dyspnea on exertion, cough, sputum, wheezing.  GI: See history of present illness. GU:  Negative for dysuria, hematuria, urinary incontinence, urinary frequency, nocturnal urination.  Endo: Negative for unusual weight change.    Physical Examination:   BP (!) 171/78    Pulse 70    Temp (!) 96.8 F (36 C) (Temporal)    Ht 5\' 9"  (1.753 m)    Wt 298 lb (135.2 kg) Comment: per nursing home paperwork 12/14/21   BMI 44.01 kg/m   General: chronically ill appearing. In wheelchair. no acute distress.  Eyes: No icterus. Mouth: masked Lungs: Clear to auscultation bilaterally.  Heart: Regular rate and rhythm, no murmurs rubs or gallops.  Abdomen: Bowel sounds are normal, nontender, nondistended, no hepatosplenomegaly or masses, no abdominal bruits or hernia , no rebound or guarding.   Extremities: 3-4+ edema of both feet and lower legs to the knees.  Unable to evaluate skin due to tight clothing, patient reporting wounds on both legs.   No clubbing or deformities. Neuro: Alert and oriented x 4   Skin: Warm and dry, no jaundice.   Psych: Alert and cooperative, normal mood and affect.    Labs:  Lab Results  Component Value Date   LIPASE 43 10/19/2021   Lab Results  Component Value Date   CREATININE 1.80 (H) 10/19/2021   BUN 32 (H) 10/19/2021   NA 135 10/19/2021   K 4.1 10/19/2021   CL 96 (L) 10/19/2021   CO2 28 10/19/2021   Lab Results  Component Value Date   WBC 10.7 (H) 10/19/2021   HGB 11.4 (L) 10/19/2021   HCT 35.4 (L) 10/19/2021    MCV 90.1 10/19/2021   PLT 282 10/19/2021   Lab Results  Component Value Date   ALT 13 10/19/2021   AST 18 10/19/2021   ALKPHOS 108 10/19/2021   BILITOT 0.5 10/19/2021   No results found for: CEA   Imaging Studies: No results found.   Assessment:  Rectal cancer:  First noted at screening colonoscopy July 18, 2021.  2 polyps removed from the sigmoid colon one measuring 15 mm and one measuring 5 mm placed in the same container.  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  No lymphovascular invasion.  No poorly differentiated component.  Follow-up flexible sigmoidoscopy in October showing ulcerated site at prior polypectomy noted in the rectum, 5 to 8 cm from the anal verge.  No additional tissue obtained.  No report of tattooing site.  Patient has subsequently been followed by Dr. Delton Coombes as outlined above.  Recently seen colorectal surgeon, Dr. Leighton Ruff.  After reviewing slides with pathologist, she feels like cancer was superficial and that patient can be followed endoscopically.  She is requesting site to be tattooed for assist with ongoing surveillance.  GERD: Poorly controlled since coming off of omeprazole.  No alarm symptoms.  Constipation: Chronic in nature.  MiraLAX helpful when he uses it.  He typically waits 5 to 6 days without a bowel movement before requesting MiraLAX.  Encouraged him to request MiraLAX if he goes more than 24 hours without a bowel movement.  Patient agrees.  Plan: Omeprazole 40 mg daily before breakfast.   Encouraged utilizing MiraLAX more regularly, take a  dose if he has not had a bowel movement in 24 hours..   Flexible sigmoidoscopy with Dr. Abbey Chatters.  ASA 3.   I have discussed the risks, alternatives, benefits with regards to but not limited to the risk of reaction to medication, bleeding, infection, perforation and the patient is  agreeable to proceed. Written consent to be obtained.

## 2021-12-16 NOTE — Progress Notes (Signed)
Primary Care Physician: Caprice Renshaw, MD  Primary Gastroenterologist:  [name]. Abbey Chatters, DO   Chief Complaint  Patient presents with   Constipation    Hasn't had a bm in approx 1 week   Gastroesophageal Reflux    HPI: Shane Chambers is a 61 y.o. male here for urgent follow-up at the request of Dr. Delton Coombes for sigmoidoscopy. Dr. Marcello Moores requesting tattoo placement around the area to help with identification of previous site of cancer.   Patient was diagnosed with rectal cancer found on screening colonoscopy in September 2022.  There are 2 polyps in the sigmoid colon, these were resected and retrieved, one measuring 39mm and the other 53mm.  One of these polyps had invasive adenocarcinoma within a tubular adenoma, high risk features including invasion to a depth of 22mm and carcinoma 47mm from polyp margin. No lymphovascular invasion or poor differentiation.  He underwent a follow-up flexible sigmoidoscopy in October for purpose of tattooing the site. The polypectomy site had 82mm ulcer, noted at approximately 5-8cm from anal verge. No biopsies were taken. Based on gross description of the pathology, Dr. Delton Coombes was concerned that it may be a fragmented specimen. CT scan of the chest/abdomen/pelvis showed no signs of metastatic disease. Top normal size of left para-aortic lymph node measuring 10-59mm, but with fatty hila, nonspecific. MRI pelvis without contrast, 09/12/21 showed rectal spasm and gross patient motion limits assessment. Site of biopsy under lesion not visible and not well assessed. No gross extracolonic soft tissue.  Potential left miso rectal lymph node within 1 to 2 mm of mesorectal fascia. CT pelvis with contrast ordered, no overt pathologic adenopathy identified.  Borderline prominent left.  Aortic lymph node and a borderline prominent right external iliac lymph node but both with large fatty hila.   He has been seen by Dr. Leighton Ruff, general surgery, on January 10.   After her evaluation with the pathologist, it was felt that the rectal cancer was superficial in nature and recommended following the area endoscopically for potential recurrence but no upfront surgery recommended.  Patient felt to be a poor candidate for low anterior resection but could be offered in the future if needed with a permanent colostomy. She had pathology to relook at slides, felt like cancer was superficial and advises no surgery needed at this time but would need surveillance endoscopically.      Patient currently resides at [facility] and [place] for rehabilitation after cellulitis.  Notes that he was living with his mother but it was deemed that her house was unsafe for him.  Back in August 2022 he was set up in a vocational rehabilitation program and they were supposed to be helping him find housing but he has since ended up in SNF as outlined.  Patient denies any abdominal pain or rectal pain.  Chronically his bowel movements tend to alternate between constipation and diarrhea but more so lately he has been constipated.  He has MiraLAX available but he tends to wait for 5 days without having a bowel movement before he starts it.  Never been 1 to have a daily BM.  He does not feel like he is impacted.  Denies any rectal pressure or pain.  Difficult to evaluate patient today as he is in a wheelchair and has limited mobility.  He denies any melena or rectal bleeding.  His appetite is okay.  He is noted increased issues with heartburn and upon evaluation of his MAR which was printed  today and accompanies the patient, he is no longer on acid reflux medication.  Previously did well on omeprazole 40 mg daily.  He is concerned about all the different opinions he has received regarding his cancer.  He feels like nothing is being done and is confused by recommendations for surgical resection but now surgeon states he does not need to have surgery done and to continue to follow the area  endoscopically.  Patient is concerned he is going to end up with metastatic disease.  Dr. Delton Coombes also managing his normocytic anemia.  Recently received iron infusions in November for ferritin of 30, iron saturations 12%.  Follow-up labs planned.    Current Outpatient Medications  Medication Sig Dispense Refill   acetaminophen (TYLENOL) 500 MG tablet Take 1,000 mg by mouth every 8 (eight) hours as needed for fever or moderate pain.     albuterol (VENTOLIN HFA) 108 (90 Base) MCG/ACT inhaler Inhale 1-2 puffs into the lungs every 6 (six) hours as needed for wheezing or shortness of breath. 18 g 3   amitriptyline (ELAVIL) 10 MG tablet Take 10 mg by mouth at bedtime.     aspirin 81 MG EC tablet Take 1 tablet (81 mg total) by mouth daily with breakfast. 30 tablet 12   atorvastatin (LIPITOR) 80 MG tablet Take 80 mg by mouth daily with supper.     budesonide-formoterol (SYMBICORT) 160-4.5 MCG/ACT inhaler Inhale 2 puffs into the lungs in the morning and at bedtime. 1 Inhaler 6   Cholecalciferol (VITAMIN D) 50 MCG (2000 UT) tablet Take 2,000 Units by mouth daily.     gabapentin (NEURONTIN) 300 MG capsule Take 900 mg by mouth 2 (two) times daily.     insulin aspart protamine- aspart (NOVOLOG MIX 70/30) (70-30) 100 UNIT/ML injection Inject 15 Units into the skin 2 (two) times daily with a meal.     levothyroxine (SYNTHROID) 75 MCG tablet Take 75 mcg by mouth daily before breakfast.     loperamide HCl (IMODIUM) 2 MG/15ML solution Take by mouth every 6 (six) hours as needed for diarrhea or loose stools. 34ml as needed     losartan (COZAAR) 50 MG tablet Take 50 mg by mouth daily.     magnesium oxide (MAG-OX) 400 MG tablet Take 400 mg by mouth daily.     metFORMIN (GLUCOPHAGE) 1000 MG tablet Take 1,000 mg by mouth 2 (two) times daily with a meal.     metoprolol succinate (TOPROL-XL) 25 MG 24 hr tablet Take 0.5 tablets (12.5 mg total) by mouth 2 (two) times daily. (Patient taking differently: Take 25 mg by  mouth 2 (two) times daily.) 90 tablet 1   polyethylene glycol (MIRALAX / GLYCOLAX) 17 g packet Take 17 g by mouth daily as needed for moderate constipation.     polyvinyl alcohol (LIQUIFILM TEARS) 1.4 % ophthalmic solution Place 1 drop into both eyes daily.     potassium chloride SA (KLOR-CON) 20 MEQ tablet Take 1 tablet (20 mEq total) by mouth daily. 90 tablet 1   tamsulosin (FLOMAX) 0.4 MG CAPS capsule Take 0.8 mg by mouth daily.     torsemide (DEMADEX) 20 MG tablet Take 3 tablets (60 mg total) by mouth daily. & 20 mg daily as needed for 3 lb weight gain in 24 hours or 5 lbs in one week 90 tablet 6   traMADol (ULTRAM) 50 MG tablet Take 50 mg by mouth every 6 (six) hours as needed (pain).     venlafaxine XR (EFFEXOR-XR) 150 MG  24 hr capsule Take 150 mg by mouth daily with breakfast.     No current facility-administered medications for this visit.    Allergies as of 12/16/2021 - Review Complete 12/16/2021  Allergen Reaction Noted   Ace inhibitors Swelling and Cough 12/11/2017   Other Itching 11/15/2019   Penicillins  12/11/2017   Past Medical History:  Diagnosis Date   CAD (coronary artery disease)    a. s/p CABG in 03/2020 with LIMA-LAD, RIMA-PL, RA-[id number]-OM1   Cancer ([place])    rectal   Cellulitis    Depression    Diabetes mellitus without complication ([place])    Hyperlipidemia 12/01/2019   Hypertension    Ischemic cardiomyopathy    a. EF 20-25% by echo in 02/2020 b. at 40% by echo in 03/2020 c. EF normalized to 60-65% by echo in 04/2020   Peripheral vascular disease ([place])    Sleep apnea    Type 2 diabetes mellitus ([place])    Past Surgical History:  Procedure Laterality Date   BIOPSY  07/18/2021   Procedure: BIOPSY;  Surgeon: Eloise Harman, DO;  [facility];  Service: Endoscopy;;   COLONOSCOPY WITH PROPOFOL N/A 07/18/2021   [place]: 15 millimeter polyp removed from the sigmoid colon, 5 mm polyp removed from sigmoid colon.  Nonbleeding internal hemorrhoids.   Significant looping of the colon. sigmoid path showed invasive colonic adenocarcinoma involving tubular adenoma (invades to depth of 32mm, carcinoma 2mm from margin, no lymphovascular invasion, no poorly differentiated component.   CORONARY ARTERY BYPASS GRAFT N/A 03/13/2020   Procedure: CORONARY ARTERY BYPASS GRAFTING (CABG) times five using bilateral Internal mammary arteries and left radial artery;  Surgeon: [name], MD;  Location: [place];  Service: Open Heart Surgery;  Laterality: N/A;   DENTAL SURGERY     ESOPHAGOGASTRODUODENOSCOPY (EGD) WITH PROPOFOL N/A 07/18/2021   Carver: 1 gastric polyp status post biopsy, gastritis. gastric bx with slight chronic inflammation and no H.pyori. GEJ polypectomy with mild inflammation only   FLEXIBLE SIGMOIDOSCOPY N/A 08/26/2021   Carver: Nonbleeding internal hemorrhoids.  15 mm ulcers from previous polypectomy found in the rectum.  No evidence of previous polyp.  Located 5 to 8 cm from anal verge.   POLYPECTOMY  07/18/2021   Procedure: POLYPECTOMY INTESTINAL;  Surgeon: Eloise Harman, DO;  [facility];  Service: Endoscopy;;   RADIAL ARTERY HARVEST Left 03/13/2020   Procedure: [place],;  Surgeon: [name], MD;  Location: [place];  Service: Open Heart Surgery;  Laterality: Left;   RIGHT/LEFT HEART CATH AND CORONARY ANGIOGRAPHY N/A 03/07/2020   Procedure: RIGHT/LEFT HEART CATH AND CORONARY ANGIOGRAPHY;  Surgeon: Sherren Mocha, MD;  Location: [place] [facility];  Service: Cardiovascular;  Laterality: N/A;   TEE WITHOUT CARDIOVERSION N/A 03/13/2020   Procedure: TRANSESOPHAGEAL ECHOCARDIOGRAM (TEE);  Surgeon: [name], MD;  Location: [place];  Service: Open Heart Surgery;  Laterality: N/A;   Family History  Problem Relation Age of Onset   Hypertension Mother    Social History   Tobacco Use   Smoking status: Former    Packs/day: 1.50    Types: Cigarettes    Quit date: 05/25/1995    Years since quitting:  26.5   Smokeless tobacco: Never  Vaping Use   Vaping Use: Never used  Substance Use Topics   Alcohol use: Not Currently    Comment: rarely   Drug use: No    ROS:  General: Negative for anorexia, weight loss, fever, chills, fatigue,  weakness. ENT: Negative for hoarseness, difficulty swallowing , nasal congestion. CV: Negative for chest pain, angina, palpitations, dyspnea on exertion, ++peripheral edema.  Respiratory: Negative for dyspnea at rest, dyspnea on exertion, cough, sputum, wheezing.  GI: See history of present illness. GU:  Negative for dysuria, hematuria, urinary incontinence, urinary frequency, nocturnal urination.  Endo: Negative for unusual weight change.    Physical Examination:   BP (!) 171/78    Pulse 70    Temp (!) 96.8 F (36 C) (Temporal)    Ht 5\' 9"  (1.753 m)    Wt 298 lb (135.2 kg) Comment: per nursing home paperwork 12/14/21   BMI 44.01 kg/m   General: chronically ill appearing. In wheelchair. no acute distress.  Eyes: No icterus. Mouth: masked Lungs: Clear to auscultation bilaterally.  Heart: Regular rate and rhythm, no murmurs rubs or gallops.  Abdomen: Bowel sounds are normal, nontender, nondistended, no hepatosplenomegaly or masses, no abdominal bruits or hernia , no rebound or guarding.   Extremities: 3-4+ edema of both feet and lower legs to the knees.  Unable to evaluate skin due to tight clothing, patient reporting wounds on both legs.   No clubbing or deformities. Neuro: Alert and oriented x 4   Skin: Warm and dry, no jaundice.   Psych: Alert and cooperative, normal mood and affect.    Labs:  Lab Results  Component Value Date   LIPASE 43 10/19/2021   Lab Results  Component Value Date   CREATININE 1.80 (H) 10/19/2021   BUN 32 (H) 10/19/2021   NA 135 10/19/2021   K 4.1 10/19/2021   CL 96 (L) 10/19/2021   CO2 28 10/19/2021   Lab Results  Component Value Date   WBC 10.7 (H) 10/19/2021   HGB 11.4 (L) 10/19/2021   HCT 35.4 (L) 10/19/2021    MCV 90.1 10/19/2021   PLT 282 10/19/2021   Lab Results  Component Value Date   ALT 13 10/19/2021   AST 18 10/19/2021   ALKPHOS 108 10/19/2021   BILITOT 0.5 10/19/2021   No results found for: CEA   Imaging Studies: No results found.   Assessment:  Rectal cancer:  First noted at screening colonoscopy July 18, 2021.  2 polyps removed from the sigmoid colon one measuring 15 mm and one measuring 5 mm placed in the same container.  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  No lymphovascular invasion.  No poorly differentiated component.  Follow-up flexible sigmoidoscopy in October showing ulcerated site at prior polypectomy noted in the rectum, 5 to 8 cm from the anal verge.  No additional tissue obtained.  No report of tattooing site.  Patient has subsequently been followed by Dr. Delton Coombes as outlined above.  Recently seen colorectal surgeon, Dr. Leighton Ruff.  After reviewing slides with pathologist, she feels like cancer was superficial and that patient can be followed endoscopically.  She is requesting site to be tattooed for assist with ongoing surveillance.  GERD: Poorly controlled since coming off of omeprazole.  No alarm symptoms.  Constipation: Chronic in nature.  MiraLAX helpful when he uses it.  He typically waits 5 to 6 days without a bowel movement before requesting MiraLAX.  Encouraged him to request MiraLAX if he goes more than 24 hours without a bowel movement.  Patient agrees.  Plan: Omeprazole 40 mg daily before breakfast.   Encouraged utilizing MiraLAX more regularly, take a  dose if he has not had a bowel movement in 24 hours..   Flexible sigmoidoscopy with Dr. Abbey Chatters.  ASA 3.   I have discussed the risks, alternatives, benefits with regards to but not limited to the risk of reaction to medication, bleeding, infection, perforation and the patient is  agreeable to proceed. Written consent to be obtained.

## 2021-12-18 ENCOUNTER — Other Ambulatory Visit (HOSPITAL_COMMUNITY): Payer: Medicare Other

## 2021-12-18 ENCOUNTER — Encounter (HOSPITAL_COMMUNITY)
Admission: RE | Admit: 2021-12-18 | Discharge: 2021-12-18 | Disposition: A | Discharge status: Home / Self Care | Payer: Medicare Other | Source: Ambulatory Visit | Attending: Internal Medicine | Admitting: Internal Medicine

## 2021-12-18 ENCOUNTER — Encounter (HOSPITAL_COMMUNITY): Payer: Self-pay

## 2021-12-18 VITALS — BP 131/82 | HR 70 | Temp 97.6°F | Resp 18 | Ht 69.0 in | Wt 292.0 lb

## 2021-12-18 DIAGNOSIS — D509 Iron deficiency anemia, unspecified: Secondary | ICD-10-CM | POA: Diagnosis not present

## 2021-12-18 DIAGNOSIS — Z79899 Other long term (current) drug therapy: Secondary | ICD-10-CM | POA: Diagnosis not present

## 2021-12-18 DIAGNOSIS — I5042 Chronic combined systolic (congestive) and diastolic (congestive) heart failure: Secondary | ICD-10-CM

## 2021-12-18 DIAGNOSIS — Z01818 Encounter for other preprocedural examination: Secondary | ICD-10-CM | POA: Diagnosis not present

## 2021-12-18 HISTORY — DX: Chronic obstructive pulmonary disease, unspecified: J44.9

## 2021-12-18 HISTORY — DX: Anemia, unspecified: D64.9

## 2021-12-18 HISTORY — DX: Personal history of urinary calculi: Z87.442

## 2021-12-18 HISTORY — DX: Gastro-esophageal reflux disease without esophagitis: K21.9

## 2021-12-18 HISTORY — DX: Unspecified osteoarthritis, unspecified site: M19.90

## 2021-12-18 HISTORY — DX: Acute myocardial infarction, unspecified: I21.9

## 2021-12-18 HISTORY — DX: Hypothyroidism, unspecified: E03.9

## 2021-12-18 LAB — BASIC METABOLIC PANEL
Anion gap: 9 (ref 5–15)
BUN: 33 mg/dL — ABNORMAL HIGH (ref 6–20)
CO2: 28 mmol/L (ref 22–32)
Calcium: 8.9 mg/dL (ref 8.9–10.3)
Chloride: 101 mmol/L (ref 98–111)
Creatinine, Ser: 1.38 mg/dL — ABNORMAL HIGH (ref 0.61–1.24)
GFR, Estimated: 59 mL/min — ABNORMAL LOW (ref >60)
Glucose, Bld: 133 mg/dL — ABNORMAL HIGH (ref 70–99)
Potassium: 3.7 mmol/L (ref 3.5–5.1)
Sodium: 138 mmol/L (ref 135–145)

## 2021-12-18 LAB — CBC WITH DIFFERENTIAL/PLATELET
Abs Immature Granulocytes: 0.27 10*3/uL — ABNORMAL HIGH (ref 0.00–0.07)
Basophils Absolute: 0.1 10*3/uL (ref 0.0–0.1)
Basophils Relative: 1 %
Eosinophils Absolute: 0.7 10*3/uL — ABNORMAL HIGH (ref 0.0–0.5)
Eosinophils Relative: 6 %
HCT: 38.1 % — ABNORMAL LOW (ref 39.0–52.0)
Hemoglobin: 12.6 g/dL — ABNORMAL LOW (ref 13.0–17.0)
Immature Granulocytes: 2 %
Lymphocytes Relative: 22 %
Lymphs Abs: 2.4 10*3/uL (ref 0.7–4.0)
MCH: 30.4 pg (ref 26.0–34.0)
MCHC: 33.1 g/dL (ref 30.0–36.0)
MCV: 91.8 fL (ref 80.0–100.0)
Monocytes Absolute: 1.2 10*3/uL — ABNORMAL HIGH (ref 0.1–1.0)
Monocytes Relative: 11 %
Neutro Abs: 6.5 10*3/uL (ref 1.7–7.7)
Neutrophils Relative %: 58 %
Platelets: 284 10*3/uL (ref 150–400)
RBC: 4.15 MIL/uL — ABNORMAL LOW (ref 4.22–5.81)
RDW: 13.1 % (ref 11.5–15.5)
WBC: 11.2 10*3/uL — ABNORMAL HIGH (ref 4.0–10.5)
nRBC: 0 % (ref 0.0–0.2)

## 2021-12-18 NOTE — Progress Notes (Signed)
°   12/18/21 0818  OBSTRUCTIVE SLEEP APNEA  Have you ever been diagnosed with sleep apnea through a sleep study? Yes  If yes, do you have and use a CPAP or BPAP machine every night? 0  Do you snore loudly (loud enough to be heard through closed doors)?  0  Do you often feel tired, fatigued, or sleepy during the daytime (such as falling asleep during driving or talking to someone)? 0  Has anyone observed you stop breathing during your sleep? 1  Do you have, or are you being treated for high blood pressure? 1  BMI more than 35 kg/m2? 1  Age > 50 (1-yes) 1  Neck circumference greater than:Male 16 inches or larger, Male 17inches or larger? 0  Male Gender (Yes=1) 1  Obstructive Sleep Apnea Score 5

## 2021-12-19 ENCOUNTER — Telehealth: Payer: Self-pay | Admitting: Internal Medicine

## 2021-12-19 NOTE — Telephone Encounter (Signed)
Jodell Cipro from the [facility] called to say that the patient was scheduled for procedure today and he didn't do his prep and needs to reschedule. Call [place] at [phone]

## 2021-12-19 NOTE — Telephone Encounter (Signed)
Spoke with Jodell Cipro. Patient enema was dropped off in the room with pt and he thought the nurse was going to help him and the nurse thought the patient would do it by himself. Nothing was ever done. Offered sooner dates but not able to do. Scheduled for 3/2 at 9:00am. Instructions faxed.

## 2022-01-07 ENCOUNTER — Encounter (HOSPITAL_COMMUNITY)
Admission: RE | Admit: 2022-01-07 | Discharge: 2022-01-07 | Disposition: A | Discharge status: Home / Self Care | Payer: Medicare Other | Source: Ambulatory Visit | Attending: Internal Medicine | Admitting: Internal Medicine

## 2022-01-07 NOTE — Progress Notes (Signed)
Spoke with Shane Chambers at [facility].  Arrival time 7:00 am 01/09/22.  Reviewed letter from office. NPO after Midnight and  no diabetic medications am of procedure.

## 2022-01-09 ENCOUNTER — Ambulatory Visit (HOSPITAL_BASED_OUTPATIENT_CLINIC_OR_DEPARTMENT_OTHER): Payer: Medicare Other | Admitting: Anesthesiology

## 2022-01-09 ENCOUNTER — Ambulatory Visit (HOSPITAL_COMMUNITY): Payer: Medicare Other | Admitting: Anesthesiology

## 2022-01-09 ENCOUNTER — Ambulatory Visit (HOSPITAL_COMMUNITY)
Admission: RE | Admit: 2022-01-09 | Discharge: 2022-01-09 | Disposition: A | Discharge status: Skilled Nursing Facility | Payer: Medicare Other | Source: Ambulatory Visit | Attending: Internal Medicine | Admitting: Internal Medicine

## 2022-01-09 ENCOUNTER — Encounter (HOSPITAL_COMMUNITY): Payer: Self-pay

## 2022-01-09 ENCOUNTER — Encounter (HOSPITAL_COMMUNITY)
Admission: RE | Disposition: A | Discharge status: Skilled Nursing Facility | Payer: Self-pay | Source: Ambulatory Visit | Attending: Internal Medicine

## 2022-01-09 DIAGNOSIS — Z9889 Other specified postprocedural states: Secondary | ICD-10-CM

## 2022-01-09 DIAGNOSIS — K6289 Other specified diseases of anus and rectum: Secondary | ICD-10-CM

## 2022-01-09 DIAGNOSIS — K219 Gastro-esophageal reflux disease without esophagitis: Secondary | ICD-10-CM | POA: Diagnosis not present

## 2022-01-09 DIAGNOSIS — E1151 Type 2 diabetes mellitus with diabetic peripheral angiopathy without gangrene: Secondary | ICD-10-CM | POA: Diagnosis not present

## 2022-01-09 DIAGNOSIS — D649 Anemia, unspecified: Secondary | ICD-10-CM | POA: Insufficient documentation

## 2022-01-09 DIAGNOSIS — K5909 Other constipation: Secondary | ICD-10-CM | POA: Diagnosis not present

## 2022-01-09 DIAGNOSIS — Z08 Encounter for follow-up examination after completed treatment for malignant neoplasm: Secondary | ICD-10-CM | POA: Insufficient documentation

## 2022-01-09 DIAGNOSIS — I2510 Atherosclerotic heart disease of native coronary artery without angina pectoris: Secondary | ICD-10-CM

## 2022-01-09 DIAGNOSIS — Z85048 Personal history of other malignant neoplasm of rectum, rectosigmoid junction, and anus: Secondary | ICD-10-CM | POA: Insufficient documentation

## 2022-01-09 DIAGNOSIS — Z79899 Other long term (current) drug therapy: Secondary | ICD-10-CM | POA: Diagnosis not present

## 2022-01-09 DIAGNOSIS — F32A Depression, unspecified: Secondary | ICD-10-CM | POA: Insufficient documentation

## 2022-01-09 DIAGNOSIS — Z87891 Personal history of nicotine dependence: Secondary | ICD-10-CM | POA: Diagnosis not present

## 2022-01-09 DIAGNOSIS — I110 Hypertensive heart disease with heart failure: Secondary | ICD-10-CM

## 2022-01-09 DIAGNOSIS — Z7984 Long term (current) use of oral hypoglycemic drugs: Secondary | ICD-10-CM | POA: Diagnosis not present

## 2022-01-09 DIAGNOSIS — G4730 Sleep apnea, unspecified: Secondary | ICD-10-CM | POA: Insufficient documentation

## 2022-01-09 DIAGNOSIS — I252 Old myocardial infarction: Secondary | ICD-10-CM | POA: Insufficient documentation

## 2022-01-09 DIAGNOSIS — E039 Hypothyroidism, unspecified: Secondary | ICD-10-CM | POA: Insufficient documentation

## 2022-01-09 DIAGNOSIS — Z951 Presence of aortocoronary bypass graft: Secondary | ICD-10-CM | POA: Diagnosis not present

## 2022-01-09 DIAGNOSIS — I509 Heart failure, unspecified: Secondary | ICD-10-CM | POA: Diagnosis not present

## 2022-01-09 DIAGNOSIS — D759 Disease of blood and blood-forming organs, unspecified: Secondary | ICD-10-CM | POA: Insufficient documentation

## 2022-01-09 HISTORY — PX: SUBMUCOSAL LIFTING INJECTION: SHX6855

## 2022-01-09 HISTORY — PX: SUBMUCOSAL TATTOO INJECTION: SHX6856

## 2022-01-09 HISTORY — PX: FLEXIBLE SIGMOIDOSCOPY: SHX5431

## 2022-01-09 HISTORY — PX: BIOPSY: SHX5522

## 2022-01-09 LAB — GLUCOSE, CAPILLARY: Glucose-Capillary: 100 mg/dL — ABNORMAL HIGH (ref 70–99)

## 2022-01-09 SURGERY — SIGMOIDOSCOPY, FLEXIBLE
Anesthesia: General

## 2022-01-09 MED ORDER — SODIUM CHLORIDE FLUSH 0.9 % IV SOLN
INTRAVENOUS | Status: AC
  Filled 2022-01-09: qty 10

## 2022-01-09 MED ORDER — DEXMEDETOMIDINE (PRECEDEX) IN NS 20 MCG/5ML (4 MCG/ML) IV SYRINGE
PREFILLED_SYRINGE | INTRAVENOUS | Status: DC | PRN
  Administered 2022-01-09: 20 ug via INTRAVENOUS

## 2022-01-09 MED ORDER — LACTATED RINGERS IV SOLN
INTRAVENOUS | Status: DC
  Administered 2022-01-09: 1000 mL via INTRAVENOUS

## 2022-01-09 MED ORDER — PROPOFOL 500 MG/50ML IV EMUL
INTRAVENOUS | Status: DC | PRN
  Administered 2022-01-09: 100 ug/kg/min via INTRAVENOUS

## 2022-01-09 MED ORDER — SPOT INK MARKER SYRINGE KIT
PACK | SUBMUCOSAL | Status: AC
  Filled 2022-01-09: qty 5

## 2022-01-09 MED ORDER — SPOT INK MARKER SYRINGE KIT
PACK | SUBMUCOSAL | Status: DC | PRN
  Administered 2022-01-09: .5 mL via SUBMUCOSAL

## 2022-01-09 NOTE — Anesthesia Postprocedure Evaluation (Signed)
Anesthesia Post Note ? ?Patient: Shane Chambers ? ?Procedure(s) Performed: FLEXIBLE SIGMOIDOSCOPY ?SUBMUCOSAL TATTOO INJECTION ?SUBMUCOSAL LIFTING INJECTION ?BIOPSY ? ?Patient location during evaluation: Phase II ?Anesthesia Type: General ?Level of consciousness: awake and alert and oriented ?Pain management: pain level controlled ?Vital Signs Assessment: post-procedure vital signs reviewed and stable ?Respiratory status: spontaneous breathing, nonlabored ventilation and respiratory function stable ?Cardiovascular status: blood pressure returned to baseline and stable ?Postop Assessment: no apparent nausea or vomiting ?Anesthetic complications: no ? ? ?No notable events documented. ? ? ?Last Vitals:  ?Vitals:  ? 01/09/22 1051 01/09/22 1102  ?BP: 130/68   ?Pulse: 65   ?Resp: 14   ?Temp:    ?SpO2: 99% 97%  ?  ?Last Pain:  ?Vitals:  ? 01/09/22 1102  ?TempSrc:   ?PainSc: 0-No pain  ? ? ?  ?  ?  ?  ?  ?  ? ?Shane Chambers ? ? ? ? ?

## 2022-01-09 NOTE — Interval H&P Note (Signed)
History and Physical Interval Note: ? ?01/09/2022 ?10:14 AM ? ?Shane Chambers  has presented today for surgery, with the diagnosis of sigmoid CA.  The various methods of treatment have been discussed with the patient and family. After consideration of risks, benefits and other options for treatment, the patient has consented to  Procedure(s): ?FLEXIBLE SIGMOIDOSCOPY (N/A) as a surgical intervention.  The patient's history has been reviewed, patient examined, no change in status, stable for surgery.  I have reviewed the patient's chart and labs.  Questions were answered to the patient's satisfaction.   ? ? ?Eloise Harman ? ? ?

## 2022-01-09 NOTE — Anesthesia Procedure Notes (Signed)
Date/Time: 01/09/2022 10:26 AM ?Performed by: Orlie Dakin, CRNA ?Pre-anesthesia Checklist: Patient identified, Emergency Drugs available, Suction available and Patient being monitored ?Patient Re-evaluated:Patient Re-evaluated prior to induction ?Oxygen Delivery Method: Non-rebreather mask ?Induction Type: IV induction ?Placement Confirmation: positive ETCO2 ? ? ? ? ?

## 2022-01-09 NOTE — Transfer of Care (Signed)
Immediate Anesthesia Transfer of Care Note ? ?Patient: Shane Chambers ? ?Procedure(s) Performed: FLEXIBLE SIGMOIDOSCOPY ?SUBMUCOSAL TATTOO INJECTION ?SUBMUCOSAL LIFTING INJECTION ?BIOPSY ? ?Patient Location: Short Stay ? ?Anesthesia Type:MAC ? ?Level of Consciousness: awake, alert , oriented and patient cooperative ? ?Airway & Oxygen Therapy: Patient Spontanous Breathing and Patient connected to nasal cannula oxygen ? ?Post-op Assessment: Report given to RN and Post -op Vital signs reviewed and stable ? ?Post vital signs: Reviewed and stable ? ?Last Vitals:  ?Vitals Value Taken Time  ?BP    ?Temp    ?Pulse    ?Resp    ?SpO2    ? ? ?Last Pain:  ?Vitals:  ? 01/09/22 1022  ?TempSrc:   ?PainSc: 3   ?   ? ?Patients Stated Pain Goal: 8 (01/09/22 0930) ? ?Complications: No notable events documented. ?

## 2022-01-09 NOTE — Anesthesia Preprocedure Evaluation (Addendum)
Anesthesia Evaluation  ?Patient identified by MRN, date of birth, ID band ?Patient awake ? ? ? ?Reviewed: ?Allergy & Precautions, NPO status , Patient's Chart, lab work & pertinent test results, reviewed documented beta blocker date and time  ? ?Airway ?Mallampati: II ? ?TM Distance: >3 FB ?Neck ROM: Full ? ? ? Dental ? ?(+) Dental Advisory Given, Chipped, Missing ?  ?Pulmonary ?sleep apnea , pneumonia, COPD,  COPD inhaler, former smoker,  ?  ?Pulmonary exam normal ?breath sounds clear to auscultation ? ? ? ? ? ? Cardiovascular ?Exercise Tolerance: Poor ?hypertension, Pt. on home beta blockers and Pt. on medications ?+ CAD, + Past MI, + CABG, + Peripheral Vascular Disease and +CHF  ?Normal cardiovascular exam ?Rhythm:Regular Rate:Normal ? ?EF normalized to 60-65% by echo in 04/2020 ?  ?Neuro/Psych ?PSYCHIATRIC DISORDERS Depression  Neuromuscular disease   ? GI/Hepatic ?Neg liver ROS, GERD  ,  ?Endo/Other  ?diabetes, Well Controlled, Type 2, Oral Hypoglycemic AgentsHypothyroidism Morbid obesity ? Renal/GU ?  ? ?  ?Musculoskeletal ? ?(+) Arthritis , Osteoarthritis,   ? Abdominal ?  ?Peds ? Hematology ? ?(+) Blood dyscrasia, anemia ,   ?Anesthesia Other Findings ? ? Reproductive/Obstetrics ?negative OB ROS ? ?  ? ? ? ? ? ? ? ? ? ? ? ? ? ?  ?  ? ? ? ? ? ? ? ?Anesthesia Physical ? ?Anesthesia Plan ? ?ASA: 3 ? ?Anesthesia Plan: General  ? ?Post-op Pain Management:   ? ?Induction: Intravenous ? ?PONV Risk Score and Plan: TIVA ? ?Airway Management Planned: Nasal Cannula and Natural Airway ? ?Additional Equipment:  ? ?Intra-op Plan:  ? ?Post-operative Plan:  ? ?Informed Consent: I have reviewed the patients History and Physical, chart, labs and discussed the procedure including the risks, benefits and alternatives for the proposed anesthesia with the patient or authorized representative who has indicated his/her understanding and acceptance.  ? ? ? ?Dental advisory given ? ?Plan Discussed  with: CRNA and Surgeon ? ?Anesthesia Plan Comments:   ? ? ? ? ? ? ?Anesthesia Quick Evaluation ? ?

## 2022-01-09 NOTE — Discharge Instructions (Addendum)
?  Flexible Sigmoidoscopy ?Discharge Instructions ? ?Read the instructions outlined below and refer to this sheet in the next few weeks. These discharge instructions provide you with general information on caring for yourself after you leave the hospital. Your doctor may also give you specific instructions. While your treatment has been planned according to the most current medical practices available, unavoidable complications occasionally occur.  ? ?ACTIVITY ?You may resume your regular activity, but move at a slower pace for the next 24 hours.  ?Take frequent rest periods for the next 24 hours.  ?Walking will help get rid of the air and reduce the bloated feeling in your belly (abdomen).  ?No driving for 24 hours (because of the medicine (anesthesia) used during the test).   ?Do not sign any important legal documents or operate any machinery for 24 hours (because of the anesthesia used during the test).  ?NUTRITION ?Drink plenty of fluids.  ?You may resume your normal diet as instructed by your doctor.  ?Begin with a light meal and progress to your normal diet. Heavy or fried foods are harder to digest and may make you feel sick to your stomach (nauseated).  ?Avoid alcoholic beverages for 24 hours or as instructed.  ?MEDICATIONS ?You may resume your normal medications unless your doctor tells you otherwise.  ?WHAT YOU CAN EXPECT TODAY ?Some feelings of bloating in the abdomen.  ?Passage of more gas than usual.  ?Spotting of blood in your stool or on the toilet paper.  ?IF YOU HAD POLYPS REMOVED DURING THE COLONOSCOPY: ?No aspirin products for 7 days or as instructed.  ?No alcohol for 7 days or as instructed.  ?Eat a soft diet for the next 24 hours.  ?FINDING OUT THE RESULTS OF YOUR TEST ?Not all test results are available during your visit. If your test results are not back during the visit, make an appointment with your caregiver to find out the results. Do not assume everything is normal if you have not heard  from your caregiver or the medical facility. It is important for you to follow up on all of your test results.  ?SEEK IMMEDIATE MEDICAL ATTENTION IF: ?You have more than a spotting of blood in your stool.  ?Your belly is swollen (abdominal distention).  ?You are nauseated or vomiting.  ?You have a temperature over 101.  ?You have abdominal pain or discomfort that is severe or gets worse throughout the day.  ? ?The polypectomy scar looks good today.  I did not see any evidence of residual polyp tissue.  I did take biopsies of it.  I also tattooed the site for future surveillance.  Follow-up with Dr. Marcello Moores.  Follow-up with Dr. Abbey Chatters in 6 months. ? ?I hope you have a great rest of your week! ? ?[name]. Abbey Chatters, D.O. ?Gastroenterology and Hepatology ?[facility] ? ?

## 2022-01-09 NOTE — Op Note (Addendum)
[facility] ?Patient Name: Shane Chambers ?Procedure Date: 01/09/2022 10:11 AM ?MRN: [id number] ?Date of Birth: 1961-04-26 ?Attending MD: [name]. Abbey Chatters , DO ?CSN: [id number] ?Age: 61 ?Admit Type: Outpatient ?Procedure:                Flexible Sigmoidoscopy ?Indications:              Personal history of malignant rectal neoplasm ?Providers:                [name]. Abbey Chatters, DO, Janeece Riggers, RN, Emilee  ?                          Lynnell Grain RN, RN, Randa Spike, Technician ?Referring MD:              ?Medicines:                See the Anesthesia note for documentation of the  ?                          administered medications ?Complications:            No immediate complications. ?Estimated Blood Loss:     Estimated blood loss was minimal. ?Procedure:                Pre-Anesthesia Assessment: ?                          - The anesthesia plan was to use monitored  ?                          anesthesia care (MAC). ?                          After obtaining informed consent, the scope was  ?                          passed under direct vision. The PCF-HQ190L  ?                          ([id number]) scope was introduced through the anus and  ?                          advanced to the the descending colon. The flexible  ?                          sigmoidoscopy was accomplished without difficulty.  ?                          The patient tolerated the procedure well. The  ?                          quality of the bowel preparation was fair. ?Scope In: 10:24:39 AM ?Scope Out: 10:46:29 AM ?Total Procedure Duration: 0 hours 21 minutes 50 seconds  ?Findings: ?     The perianal and digital rectal examinations were normal. ?     A 15 mm post polypectomy scar was found in the rectum aaprox 5-8cm from  ?     anal verge. The  scar tissue was healthy in appearance. There was no  ?     evidence of the previous polyp. Attempted to inject Elevaiew for  ?     potential repeat EMR of polypectomy site. Adequate lift not obtained.  ?      Biopsies were then taken with a cold forceps for histology. Area just  ?     distal was tattooed with an injection of 1 mL of Spot (carbon black).  ?     See pictures. ?Impression:               - Preparation of the colon was fair. ?                          - Post-polypectomy scar in the rectum. Biopsied.  ?                          Tattooed. ?Moderate Sedation: ?     Per Anesthesia Care ?Recommendation:           - Patient has a contact number available for  ?                          emergencies. The signs and symptoms of potential  ?                          delayed complications were discussed with the  ?                          patient. Return to normal activities tomorrow.  ?                          Written discharge instructions were provided to the  ?                          patient. ?                          - Resume previous diet. ?                          - Await pathology results. ?                          - Repeat flexible sigmoidoscopy to be determined  ?                          based on pathology. ?Procedure Code(s):        --- Professional --- ?                          [phone], Sigmoidoscopy, flexible; with biopsy, single  ?                          or multiple ?                          84037, Sigmoidoscopy, flexible; with directed  ?  submucosal injection(s), any substance ?Diagnosis Code(s):        --- Professional --- ?                          E33.435, Other specified postprocedural states ?                          Z85.048, Personal history of other malignant  ?                          neoplasm of rectum, rectosigmoid junction, and anus ?CPT copyright 2019 American Medical Association. All rights reserved. ?The codes documented in this report are preliminary and upon coder review may  ?be revised to meet current compliance requirements. ?[name]. Abbey Chatters, DO ?[name]. [place], DO ?01/09/2022 10:51:39 AM ?This report has been signed electronically. ?Number of  Addenda: 0 ?

## 2022-01-10 LAB — SURGICAL PATHOLOGY

## 2022-01-13 LAB — GLUCOSE, CAPILLARY: Glucose-Capillary: 102 mg/dL — ABNORMAL HIGH (ref 70–99)

## 2022-01-14 ENCOUNTER — Encounter (HOSPITAL_COMMUNITY): Payer: Self-pay | Admitting: Internal Medicine

## 2022-01-20 ENCOUNTER — Other Ambulatory Visit (HOSPITAL_COMMUNITY): Payer: Self-pay

## 2022-01-20 DIAGNOSIS — D649 Anemia, unspecified: Secondary | ICD-10-CM

## 2022-01-20 DIAGNOSIS — D509 Iron deficiency anemia, unspecified: Secondary | ICD-10-CM

## 2022-01-20 DIAGNOSIS — C20 Malignant neoplasm of rectum: Secondary | ICD-10-CM

## 2022-01-21 ENCOUNTER — Inpatient Hospital Stay (HOSPITAL_COMMUNITY): Payer: Medicare Other

## 2022-01-21 ENCOUNTER — Inpatient Hospital Stay (HOSPITAL_COMMUNITY): Payer: Medicare Other | Attending: Hematology | Admitting: Hematology

## 2022-01-21 ENCOUNTER — Other Ambulatory Visit: Payer: Self-pay

## 2022-01-21 VITALS — BP 151/70 | HR 68 | Temp 96.9°F | Resp 18 | Ht 69.0 in | Wt 284.7 lb

## 2022-01-21 DIAGNOSIS — N189 Chronic kidney disease, unspecified: Secondary | ICD-10-CM | POA: Insufficient documentation

## 2022-01-21 DIAGNOSIS — C20 Malignant neoplasm of rectum: Secondary | ICD-10-CM

## 2022-01-21 DIAGNOSIS — D509 Iron deficiency anemia, unspecified: Secondary | ICD-10-CM

## 2022-01-21 DIAGNOSIS — Z79899 Other long term (current) drug therapy: Secondary | ICD-10-CM | POA: Diagnosis not present

## 2022-01-21 DIAGNOSIS — Z87891 Personal history of nicotine dependence: Secondary | ICD-10-CM | POA: Insufficient documentation

## 2022-01-21 DIAGNOSIS — D649 Anemia, unspecified: Secondary | ICD-10-CM

## 2022-01-21 LAB — CBC WITH DIFFERENTIAL/PLATELET
Abs Immature Granulocytes: 0.16 10*3/uL — ABNORMAL HIGH (ref 0.00–0.07)
Basophils Absolute: 0.1 10*3/uL (ref 0.0–0.1)
Basophils Relative: 1 %
Eosinophils Absolute: 0.9 10*3/uL — ABNORMAL HIGH (ref 0.0–0.5)
Eosinophils Relative: 8 %
HCT: 38.3 % — ABNORMAL LOW (ref 39.0–52.0)
Hemoglobin: 12 g/dL — ABNORMAL LOW (ref 13.0–17.0)
Immature Granulocytes: 2 %
Lymphocytes Relative: 21 %
Lymphs Abs: 2.3 10*3/uL (ref 0.7–4.0)
MCH: 28.5 pg (ref 26.0–34.0)
MCHC: 31.3 g/dL (ref 30.0–36.0)
MCV: 91 fL (ref 80.0–100.0)
Monocytes Absolute: 0.9 10*3/uL (ref 0.1–1.0)
Monocytes Relative: 8 %
Neutro Abs: 6.7 10*3/uL (ref 1.7–7.7)
Neutrophils Relative %: 60 %
Platelets: 242 10*3/uL (ref 150–400)
RBC: 4.21 MIL/uL — ABNORMAL LOW (ref 4.22–5.81)
RDW: 12.5 % (ref 11.5–15.5)
WBC: 10.9 10*3/uL — ABNORMAL HIGH (ref 4.0–10.5)
nRBC: 0 % (ref 0.0–0.2)

## 2022-01-21 LAB — COMPREHENSIVE METABOLIC PANEL
ALT: 11 U/L (ref 0–44)
AST: 18 U/L (ref 15–41)
Albumin: 3.5 g/dL (ref 3.5–5.0)
Alkaline Phosphatase: 99 U/L (ref 38–126)
Anion gap: 11 (ref 5–15)
BUN: 36 mg/dL — ABNORMAL HIGH (ref 6–20)
CO2: 28 mmol/L (ref 22–32)
Calcium: 8.9 mg/dL (ref 8.9–10.3)
Chloride: 98 mmol/L (ref 98–111)
Creatinine, Ser: 1.41 mg/dL — ABNORMAL HIGH (ref 0.61–1.24)
GFR, Estimated: 57 mL/min — ABNORMAL LOW (ref >60)
Glucose, Bld: 113 mg/dL — ABNORMAL HIGH (ref 70–99)
Potassium: 4.3 mmol/L (ref 3.5–5.1)
Sodium: 137 mmol/L (ref 135–145)
Total Bilirubin: 0.4 mg/dL (ref 0.3–1.2)
Total Protein: 6.7 g/dL (ref 6.5–8.1)

## 2022-01-21 LAB — IRON AND TIBC
Iron: 74 ug/dL (ref 45–182)
Saturation Ratios: 23 % (ref 17.9–39.5)
TIBC: 318 ug/dL (ref 250–450)
UIBC: 244 ug/dL

## 2022-01-21 LAB — FERRITIN: Ferritin: 206 ng/mL (ref 24–336)

## 2022-01-21 NOTE — Progress Notes (Signed)
? ?Shane Chambers ?[address] ?[place], [place] [address] ? ? ?CLINIC:  ?Medical Oncology/Hematology ? ?PCP:  ?Caprice Renshaw, MD ?[address] [place] [address] ?[phone] ? ? ?REASON FOR VISIT:  ?Follow-up for rectal adenocarcinoma ? ?PRIOR THERAPY: none ? ?CURRENT THERAPY: surveillance ? ?BRIEF ONCOLOGIC HISTORY:  ?Oncology History  ? No history exists.  ? ? ?CANCER STAGING: ?Cancer Staging  ?Rectal cancer ([place]) ?Staging form: Colon and Rectum, AJCC 8th Edition ?- Clinical stage from 09/03/2021: Stage 0 (cTis, cN0, cM0) - Unsigned ? ? ?INTERVAL HISTORY:  ?Shane Chambers, a 61 y.o. male, returns for routine follow-up of his rectal adenocarcinoma. Shane Chambers was last seen on 10/23/2021.  ? ?Today he reports feeling well. He denies hematochezia and hematuria.  ? ?REVIEW OF SYSTEMS:  ?Review of Systems  ?Constitutional:  Negative for appetite change and fatigue.  ?HENT:   Positive for trouble swallowing.   ?Respiratory:  Positive for shortness of breath (with exertion).   ?Cardiovascular:  Positive for palpitations.  ?Gastrointestinal:  Positive for constipation, diarrhea and nausea. Negative for blood in stool.  ?Genitourinary:  Negative for hematuria.   ?Neurological:  Positive for dizziness and headaches.  ?Psychiatric/Behavioral:  Positive for depression. The patient is nervous/anxious.   ?All other systems reviewed and are negative. ? ?PAST MEDICAL/SURGICAL HISTORY:  ?Past Medical History:  ?Diagnosis Date  ? Anemia   ? Arthritis   ? CAD (coronary artery disease)   ? a. s/p CABG in 03/2020 with LIMA-LAD, RIMA-PL, RA-D1-RI-OM1  ? Cancer [facility])   ? rectal  ? Cellulitis   ? COPD (chronic obstructive pulmonary disease) ([place])   ? Depression   ? Diabetes mellitus without complication ([place])   ? GERD (gastroesophageal reflux disease)   ? History of kidney stones   ? Hyperlipidemia 12/01/2019  ? Hypertension   ? Hypothyroidism   ? Ischemic cardiomyopathy   ? a. EF 20-25% by echo in 02/2020 b. at 40% by  echo in 03/2020 c. EF normalized to 60-65% by echo in 04/2020  ? Myocardial infarction [facility])   ? Peripheral vascular disease ([place])   ? Sleep apnea   ? Type 2 diabetes mellitus ([place])   ? ?Past Surgical History:  ?Procedure Laterality Date  ? BIOPSY  07/18/2021  ? Procedure: BIOPSY;  Surgeon: Eloise Harman, DO;  [facility];  Service: Endoscopy;;  ? BIOPSY  01/09/2022  ? Procedure: BIOPSY;  Surgeon: Eloise Harman, DO;  [facility];  Service: Endoscopy;;  ? COLONOSCOPY WITH PROPOFOL N/A 07/18/2021  ? Carver: 15 millimeter polyp removed from the sigmoid colon, 5 mm polyp removed from sigmoid colon.  Nonbleeding internal hemorrhoids.  Significant looping of the colon. sigmoid path showed invasive colonic adenocarcinoma involving tubular adenoma (invades to depth of 73m, carcinoma 110mfrom margin, no lymphovascular invasion, no poorly differentiated component.  ? CORONARY ARTERY BYPASS GRAFT N/A 03/13/2020  ? Procedure: CORONARY ARTERY BYPASS GRAFTING (CABG) times five using bilateral Internal mammary arteries and left radial artery;  Surgeon: AtWonda OldsMD;  [facility] Service: [facility] Surgery;  Laterality: N/A;  ? DENTAL SURGERY    ? ESOPHAGOGASTRODUODENOSCOPY (EGD) WITH PROPOFOL N/A 07/18/2021  ? Carver: 1 gastric polyp status post biopsy, gastritis. gastric bx with slight chronic inflammation and no H.pyori. GEJ polypectomy with mild inflammation only  ? FLEXIBLE SIGMOIDOSCOPY N/A 08/26/2021  ? Carver: Nonbleeding internal hemorrhoids.  15 mm ulcers from previous polypectomy found in the rectum.  No evidence of previous polyp.  Located 5 to 8 cm from anal verge.  ? FLEXIBLE SIGMOIDOSCOPY N/A 01/09/2022  ? Procedure: FLEXIBLE SIGMOIDOSCOPY;  Surgeon: Eloise Harman, DO;  [facility];  Service: Endoscopy;  Laterality: N/A;  ? POLYPECTOMY  07/18/2021  ? Procedure: POLYPECTOMY INTESTINAL;  Surgeon: Eloise Harman, DO;  [facility];  Service:  Endoscopy;;  ? RADIAL ARTERY HARVEST Left 03/13/2020  ? Procedure: RADIAL ARTERY HARVEST,;  Surgeon: [name], MD;  Location: [place];  Service: Open Heart Surgery;  Laterality: Left;  ? RIGHT/LEFT HEART CATH AND CORONARY ANGIOGRAPHY N/A 03/07/2020  ? Procedure: RIGHT/LEFT HEART CATH AND CORONARY ANGIOGRAPHY;  Surgeon: Sherren Mocha, MD;  Location: [place] CV LAB;  Service: Cardiovascular;  Laterality: N/A;  ? SUBMUCOSAL LIFTING INJECTION  01/09/2022  ? Procedure: SUBMUCOSAL LIFTING INJECTION;  Surgeon: Eloise Harman, DO;  [facility];  Service: Endoscopy;;  ? SUBMUCOSAL TATTOO INJECTION  01/09/2022  ? Procedure: SUBMUCOSAL TATTOO INJECTION;  Surgeon: Eloise Harman, DO;  [facility];  Service: Endoscopy;;  ? TEE WITHOUT CARDIOVERSION N/A 03/13/2020  ? Procedure: TRANSESOPHAGEAL ECHOCARDIOGRAM (TEE);  Surgeon: [name], MD;  Location: [place];  Service: Open Heart Surgery;  Laterality: N/A;  ? ? ?SOCIAL HISTORY:  ?Social History  ? ?Socioeconomic History  ? Marital status: Single  ?  Spouse name: Not on file  ? Number of children: Not on file  ? Years of education: Not on file  ? Highest education level: Not on file  ?Occupational History  ? Not on file  ?Tobacco Use  ? Smoking status: Former  ?  Packs/day: 1.50  ?  Types: Cigarettes  ?  Quit date: 05/25/1995  ?  Years since quitting: 26.6  ? Smokeless tobacco: Never  ?Vaping Use  ? Vaping Use: Never used  ?Substance and Sexual Activity  ? Alcohol use: Not Currently  ?  Comment: rarely  ? Drug use: No  ? Sexual activity: Not on file  ?Other Topics Concern  ? Not on file  ?Social History Narrative  ? Not on file  ? ?Social Determinants of Health  ? ?Financial Resource Strain: Not on file  ?Food Insecurity: Not on file  ?Transportation Needs: Not on file  ?Physical Activity: Not on file  ?Stress: Not on file  ?Social Connections: Not on file  ?Intimate Partner Violence: Not on file  ? ? ?FAMILY HISTORY:  ?Family History   ?Problem Relation Age of Onset  ? Hypertension Mother   ? ? ?CURRENT MEDICATIONS:  ?Current Outpatient Medications  ?Medication Sig Dispense Refill  ? acetaminophen (TYLENOL) 500 MG tablet Take 1,000 mg by mouth every 8 (eight) hours as needed for fever or moderate pain.    ? albuterol (VENTOLIN HFA) 108 (90 Base) MCG/ACT inhaler Inhale 1-2 puffs into the lungs every 6 (six) hours as needed for wheezing or shortness of breath. (Patient taking differently: Inhale 2 puffs into the lungs every 6 (six) hours as needed for wheezing or shortness of breath.) 18 g 3  ? amitriptyline (ELAVIL) 10 MG tablet Take 10 mg by mouth at bedtime.    ? aspirin 81 MG EC tablet Take 1 tablet (81 mg total) by mouth daily with breakfast. 30 tablet 12  ? atorvastatin (LIPITOR) 80 MG tablet Take 80 mg by mouth at bedtime.    ? budesonide-formoterol (SYMBICORT) 160-4.5 MCG/ACT inhaler Inhale 2 puffs into the lungs in the morning and at bedtime.  1 Inhaler 6  ? Cholecalciferol (VITAMIN D) 50 MCG (2000 UT) tablet Take 2,000 Units by mouth daily.    ? gabapentin (NEURONTIN) 300 MG capsule Take 900 mg by mouth 2 (two) times daily.    ? insulin aspart protamine- aspart (NOVOLOG MIX 70/30) (70-30) 100 UNIT/ML injection Inject 15 Units into the skin 2 (two) times daily with a meal.    ? levothyroxine (SYNTHROID) 75 MCG tablet Take 75 mcg by mouth daily before breakfast.    ? loperamide HCl (IMODIUM) 2 MG/15ML solution Take 4 mg by mouth every 6 (six) hours as needed for diarrhea or loose stools. 21m as needed    ? losartan (COZAAR) 50 MG tablet Take 50 mg by mouth daily.    ? magnesium oxide (MAG-OX) 400 MG tablet Take 400 mg by mouth daily.    ? metFORMIN (GLUCOPHAGE) 1000 MG tablet Take 1,000 mg by mouth 2 (two) times daily.    ? metoprolol succinate (TOPROL-XL) 25 MG 24 hr tablet Take 0.5 tablets (12.5 mg total) by mouth 2 (two) times daily. (Patient taking differently: Take 25 mg by mouth 2 (two) times daily.) 90 tablet 1  ? polyethylene glycol  (MIRALAX / GLYCOLAX) 17 g packet Take 17 g by mouth daily as needed for moderate constipation.    ? polyvinyl alcohol (LIQUIFILM TEARS) 1.4 % ophthalmic solution Place 1 drop into both eyes daily.    ? potass

## 2022-01-21 NOTE — Patient Instructions (Addendum)
[place] at [facility] ?Discharge Instructions ? ? ?You were seen and examined today by Dr. Delton Coombes. ? ?He reviewed the results of your lab work which is normal/stable.  You have a slight degree of kidney dysfunction.  Try to drink plenty of fluids to correct this.  You do not require iron at this time as your hemoglobin is 12.  ? ?Return as scheduled in 3 months.  ? ? ?Thank you for choosing [place] at [facility] to provide your oncology and hematology care.  To afford each patient quality time with our provider, please arrive at least 15 minutes before your scheduled appointment time.  ? ?If you have a lab appointment with the [facility] please come in thru the Main Entrance and check in at the main information desk. ? ?You need to re-schedule your appointment should you arrive 10 or more minutes late.  We strive to give you quality time with our providers, and arriving late affects you and other patients whose appointments are after yours.  Also, if you no show three or more times for appointments you may be dismissed from the clinic at the providers discretion.     ?Again, thank you for choosing [facility].  Our hope is that these requests will decrease the amount of time that you wait before being seen by our physicians.       ?_____________________________________________________________ ? ?Should you have questions after your visit to [facility], please contact our office at [phone] and follow the prompts.  Our office hours are 8:00 a.m. and 4:30 p.m. Monday - Friday.  Please note that voicemails left after 4:00 p.m. may not be returned until the following business day.  We are closed weekends and major holidays.  You do have access to a nurse 24-7, just call the main number to the clinic [phone] and do not press any options, hold on the line and a nurse will answer the phone.   ? ?For prescription refill  requests, have your pharmacy contact our office and allow 72 hours.   ? ?Due to Covid, you will need to wear a mask upon entering the hospital. If you do not have a mask, a mask will be given to you at the Main Entrance upon arrival. For doctor visits, patients may have 1 support person age 17 or older with them. For treatment visits, patients can not have anyone with them due to social distancing guidelines and our immunocompromised population.  ? ?   ?

## 2022-01-30 ENCOUNTER — Ambulatory Visit: Payer: Medicare Other | Admitting: Gastroenterology

## 2022-01-31 ENCOUNTER — Ambulatory Visit: Payer: Medicare Other | Admitting: Cardiology

## 2022-02-19 ENCOUNTER — Encounter: Payer: Self-pay | Admitting: Cardiology

## 2022-02-19 ENCOUNTER — Ambulatory Visit (INDEPENDENT_AMBULATORY_CARE_PROVIDER_SITE_OTHER): Payer: Medicare Other | Admitting: Cardiology

## 2022-02-19 VITALS — BP 114/64 | HR 76 | Ht 69.0 in | Wt 277.0 lb

## 2022-02-19 DIAGNOSIS — I2510 Atherosclerotic heart disease of native coronary artery without angina pectoris: Secondary | ICD-10-CM

## 2022-02-19 DIAGNOSIS — I5032 Chronic diastolic (congestive) heart failure: Secondary | ICD-10-CM | POA: Diagnosis not present

## 2022-02-19 DIAGNOSIS — I890 Lymphedema, not elsewhere classified: Secondary | ICD-10-CM | POA: Diagnosis not present

## 2022-02-19 DIAGNOSIS — E782 Mixed hyperlipidemia: Secondary | ICD-10-CM | POA: Diagnosis not present

## 2022-02-19 NOTE — Progress Notes (Signed)
? ? ? ?Clinical Summary ?Shane Chambers is a 61 y.o.male seen today for follow up of the following medical problems.  ?  ?1. CAD ?- s/p CABG in 03/2020 with LIMA-LAD, RIMA-PL, RA-D1-RI-OM1 ?- his CABG was in the setting of NSTEMI, committed to 1 year DAPT ?  ?- no recent chest pain. CHronic SOB unchanged ?- compliant with meds ?  ?  ?2. Chronic diastolic HF ?-  - 01/1539 Echo: LVEF 60-65%, mod LVH, grade II DDx, normal RV function ?- 07/2020 limited echo LVEF 55-60%, grade II DDx (unchanged) ?- some improvement in LE edema ?- weights at nursing home 279, slow trending down.  ?  ?- chronic LE edema, nursing home weight 277 lbs.  ?- some SOB recently. No orthopnea.  ?  ?3 Hyperlipidemia ?- compliant with meds ? - no recent labs ?  ?4. Cellulitis ?- admission Jan 2022 with sepsis, cellulitis ? ?5. Rectal cancer ?- followed by oncology ?Past Medical History:  ?Diagnosis Date  ? Anemia   ? Arthritis   ? CAD (coronary artery disease)   ? a. s/p CABG in 03/2020 with LIMA-LAD, RIMA-PL, RA-D1-RI-OM1  ? Cancer [facility])   ? rectal  ? Cellulitis   ? COPD (chronic obstructive pulmonary disease) ([place])   ? Depression   ? Diabetes mellitus without complication ([place])   ? GERD (gastroesophageal reflux disease)   ? History of kidney stones   ? Hyperlipidemia 12/01/2019  ? Hypertension   ? Hypothyroidism   ? Ischemic cardiomyopathy   ? a. EF 20-25% by echo in 02/2020 b. at 40% by echo in 03/2020 c. EF normalized to 60-65% by echo in 04/2020  ? Myocardial infarction [facility])   ? Peripheral vascular disease ([place])   ? Sleep apnea   ? Type 2 diabetes mellitus ([place])   ? ? ? ?Allergies  ?Allergen Reactions  ? Ace Inhibitors Swelling and Cough  ?  (Not on [facility] and [place])  ? Other Itching  ?  [name]  ? Penicillins   ?  Has patient had a PCN reaction causing immediate rash, facial/tongue/throat swelling, SOB or lightheadedness with hypotension: Unknown ?Has patient had a PCN reaction causing severe rash involving mucus  membranes or skin necrosis: Unknown ?Has patient had a PCN reaction that required hospitalization: Unknown ?Has patient had a PCN reaction occurring within the last 10 years: Unknown ?If all of the above answers are "NO", then may proceed with Cephalosporin use. ?  ? ? ? ?Current Outpatient Medications  ?Medication Sig Dispense Refill  ? acetaminophen (TYLENOL) 500 MG tablet Take 1,000 mg by mouth every 8 (eight) hours as needed for fever or moderate pain.    ? albuterol (VENTOLIN HFA) 108 (90 Base) MCG/ACT inhaler Inhale 1-2 puffs into the lungs every 6 (six) hours as needed for wheezing or shortness of breath. (Patient taking differently: Inhale 2 puffs into the lungs every 6 (six) hours as needed for wheezing or shortness of breath.) 18 g 3  ? amitriptyline (ELAVIL) 25 MG tablet Take 25 mg by mouth at bedtime.    ? aspirin 81 MG EC tablet Take 1 tablet (81 mg total) by mouth daily with breakfast. 30 tablet 12  ? atorvastatin (LIPITOR) 80 MG tablet Take 80 mg by mouth at bedtime.    ? budesonide-formoterol (SYMBICORT) 160-4.5 MCG/ACT inhaler Inhale 2 puffs into the lungs in the morning and at bedtime. 1 Inhaler 6  ? Cholecalciferol (VITAMIN D) 50 MCG (2000 UT) tablet  Take 2,000 Units by mouth daily.    ? gabapentin (NEURONTIN) 300 MG capsule Take 900 mg by mouth 2 (two) times daily.    ? insulin aspart protamine- aspart (NOVOLOG MIX 70/30) (70-30) 100 UNIT/ML injection Inject 15 Units into the skin 2 (two) times daily with a meal.    ? levothyroxine (SYNTHROID) 75 MCG tablet Take 75 mcg by mouth daily before breakfast.    ? loperamide HCl (IMODIUM) 2 MG/15ML solution Take 4 mg by mouth every 6 (six) hours as needed for diarrhea or loose stools. 73m as needed    ? losartan (COZAAR) 50 MG tablet Take 50 mg by mouth daily.    ? magnesium oxide (MAG-OX) 400 MG tablet Take 400 mg by mouth daily.    ? metFORMIN (GLUCOPHAGE) 1000 MG tablet Take 1,000 mg by mouth 2 (two) times daily.    ? metoprolol succinate (TOPROL-XL)  25 MG 24 hr tablet Take 0.5 tablets (12.5 mg total) by mouth 2 (two) times daily. (Patient taking differently: Take 25 mg by mouth 2 (two) times daily.) 90 tablet 1  ? omeprazole (PRILOSEC) 40 MG capsule Take 40 mg by mouth daily.    ? polyethylene glycol (MIRALAX / GLYCOLAX) 17 g packet Take 17 g by mouth daily as needed for moderate constipation.    ? polyvinyl alcohol (LIQUIFILM TEARS) 1.4 % ophthalmic solution Place 1 drop into both eyes daily.    ? potassium chloride SA (KLOR-CON) 20 MEQ tablet Take 1 tablet (20 mEq total) by mouth daily. 90 tablet 1  ? tamsulosin (FLOMAX) 0.4 MG CAPS capsule Take 0.8 mg by mouth daily.    ? torsemide (DEMADEX) 20 MG tablet Take 3 tablets (60 mg total) by mouth daily. & 20 mg daily as needed for 3 lb weight gain in 24 hours or 5 lbs in one week 90 tablet 6  ? traMADol (ULTRAM) 50 MG tablet Take 50 mg by mouth every 6 (six) hours as needed (pain).    ? venlafaxine XR (EFFEXOR-XR) 150 MG 24 hr capsule Take 150 mg by mouth daily with breakfast.    ? ?No current facility-administered medications for this visit.  ? ? ? ?Past Surgical History:  ?Procedure Laterality Date  ? BIOPSY  07/18/2021  ? Procedure: BIOPSY;  Surgeon: CEloise Harman DO;  [facility];  Service: Endoscopy;;  ? BIOPSY  01/09/2022  ? Procedure: BIOPSY;  Surgeon: CEloise Harman DO;  [facility];  Service: Endoscopy;;  ? COLONOSCOPY WITH PROPOFOL N/A 07/18/2021  ? [place]ver: 15 millimeter polyp removed from the sigmoid colon, 5 mm polyp removed from sigmoid colon.  Nonbleeding internal hemorrhoids.  Significant looping of the colon. sigmoid path showed invasive colonic adenocarcinoma involving tubular adenoma (invades to depth of 264m carcinoma 13m45mrom margin, no lymphovascular invasion, no poorly differentiated component.  ? CORONARY ARTERY BYPASS GRAFT N/A 03/13/2020  ? Procedure: CORONARY ARTERY BYPASS GRAFTING (CABG) times five using bilateral Internal mammary arteries and left radial  artery;  Surgeon: AtkWonda OldsD;  [facility];  Laterality: N/A;  ? DENTAL SURGERY    ? ESOPHAGOGASTRODUODENOSCOPY (EGD) WITH PROPOFOL N/A 07/18/2021  ? Carver: 1 gastric polyp status post biopsy, gastritis. gastric bx with slight chronic inflammation and no H.pyori. GEJ polypectomy with mild inflammation only  ? FLEXIBLE SIGMOIDOSCOPY N/A 08/26/2021  ? Carver: Nonbleeding internal hemorrhoids.  15 mm ulcers from previous polypectomy found in the rectum.  No evidence of previous  polyp.  Located 5 to 8 cm from anal verge.  ? FLEXIBLE SIGMOIDOSCOPY N/A 01/09/2022  ? Procedure: FLEXIBLE SIGMOIDOSCOPY;  Surgeon: Eloise Harman, DO;  [facility];  Service: Endoscopy;  Laterality: N/A;  ? POLYPECTOMY  07/18/2021  ? Procedure: POLYPECTOMY INTESTINAL;  Surgeon: Eloise Harman, DO;  [facility];  Service: Endoscopy;;  ? RADIAL ARTERY HARVEST Left 03/13/2020  ? Procedure: RADIAL ARTERY HARVEST,;  Surgeon: [name], MD;  Location: [place];  Service: Open Heart Surgery;  Laterality: Left;  ? RIGHT/LEFT HEART CATH AND CORONARY ANGIOGRAPHY N/A 03/07/2020  ? Procedure: RIGHT/LEFT HEART CATH AND CORONARY ANGIOGRAPHY;  Surgeon: Sherren Mocha, MD;  Location: [place] CV LAB;  Service: Cardiovascular;  Laterality: N/A;  ? SUBMUCOSAL LIFTING INJECTION  01/09/2022  ? Procedure: SUBMUCOSAL LIFTING INJECTION;  Surgeon: Eloise Harman, DO;  [facility];  Service: Endoscopy;;  ? SUBMUCOSAL TATTOO INJECTION  01/09/2022  ? Procedure: SUBMUCOSAL TATTOO INJECTION;  Surgeon: Eloise Harman, DO;  [facility];  Service: Endoscopy;;  ? TEE WITHOUT CARDIOVERSION N/A 03/13/2020  ? Procedure: TRANSESOPHAGEAL ECHOCARDIOGRAM (TEE);  Surgeon: [name], MD;  Location: [place];  Service: Open Heart Surgery;  Laterality: N/A;  ? ? ? ?Allergies  ?Allergen Reactions  ? Ace Inhibitors Swelling and Cough  ?  (Not on [facility] and  [place])  ? Other Itching  ?  [name]  ? Penicillins   ?  Has patient had a PCN reaction causing immediate rash, facial/tongue/throat swelling, SOB or lightheadedness with hypotension: U

## 2022-02-19 NOTE — Patient Instructions (Signed)
Medication Instructions:  ?Your physician recommends that you continue on your current medications as directed. Please refer to the Current Medication list given to you today.  ? ?Labwork: ?FLP in 2 weeks ? ?Testing/Procedures: ?none ? ?Follow-Up: ?Your physician recommends that you schedule a follow-up appointment in: 6 months ? ?Any Other Special Instructions Will Be Listed Below (If Applicable). ? ?If you need a refill on your cardiac medications before your next appointment, please call your pharmacy. ? ?

## 2022-04-22 ENCOUNTER — Emergency Department (HOSPITAL_COMMUNITY): Payer: Medicare Other

## 2022-04-22 ENCOUNTER — Inpatient Hospital Stay (HOSPITAL_COMMUNITY)
Admission: EM | Admit: 2022-04-22 | Discharge: 2022-05-02 | DRG: 871 | Disposition: A | Discharge status: Skilled Nursing Facility | Payer: Medicare Other | Source: Skilled Nursing Facility | Attending: Family Medicine | Admitting: Family Medicine

## 2022-04-22 ENCOUNTER — Other Ambulatory Visit: Payer: Self-pay

## 2022-04-22 ENCOUNTER — Encounter (HOSPITAL_COMMUNITY): Payer: Self-pay | Admitting: Emergency Medicine

## 2022-04-22 DIAGNOSIS — L03116 Cellulitis of left lower limb: Secondary | ICD-10-CM | POA: Diagnosis present

## 2022-04-22 DIAGNOSIS — C20 Malignant neoplasm of rectum: Secondary | ICD-10-CM | POA: Diagnosis present

## 2022-04-22 DIAGNOSIS — Z87891 Personal history of nicotine dependence: Secondary | ICD-10-CM

## 2022-04-22 DIAGNOSIS — R6520 Severe sepsis without septic shock: Secondary | ICD-10-CM

## 2022-04-22 DIAGNOSIS — S81802D Unspecified open wound, left lower leg, subsequent encounter: Secondary | ICD-10-CM

## 2022-04-22 DIAGNOSIS — Z794 Long term (current) use of insulin: Secondary | ICD-10-CM

## 2022-04-22 DIAGNOSIS — I5043 Acute on chronic combined systolic (congestive) and diastolic (congestive) heart failure: Secondary | ICD-10-CM | POA: Diagnosis not present

## 2022-04-22 DIAGNOSIS — E1151 Type 2 diabetes mellitus with diabetic peripheral angiopathy without gangrene: Secondary | ICD-10-CM | POA: Diagnosis present

## 2022-04-22 DIAGNOSIS — I248 Other forms of acute ischemic heart disease: Secondary | ICD-10-CM | POA: Diagnosis present

## 2022-04-22 DIAGNOSIS — I5042 Chronic combined systolic (congestive) and diastolic (congestive) heart failure: Secondary | ICD-10-CM

## 2022-04-22 DIAGNOSIS — N179 Acute kidney failure, unspecified: Secondary | ICD-10-CM

## 2022-04-22 DIAGNOSIS — N1831 Chronic kidney disease, stage 3a: Secondary | ICD-10-CM | POA: Diagnosis present

## 2022-04-22 DIAGNOSIS — I130 Hypertensive heart and chronic kidney disease with heart failure and stage 1 through stage 4 chronic kidney disease, or unspecified chronic kidney disease: Secondary | ICD-10-CM | POA: Diagnosis present

## 2022-04-22 DIAGNOSIS — A419 Sepsis, unspecified organism: Principal | ICD-10-CM

## 2022-04-22 DIAGNOSIS — Z79890 Hormone replacement therapy (postmenopausal): Secondary | ICD-10-CM

## 2022-04-22 DIAGNOSIS — D72829 Elevated white blood cell count, unspecified: Secondary | ICD-10-CM | POA: Diagnosis not present

## 2022-04-22 DIAGNOSIS — D631 Anemia in chronic kidney disease: Secondary | ICD-10-CM | POA: Diagnosis present

## 2022-04-22 DIAGNOSIS — Z6841 Body Mass Index (BMI) 40.0 and over, adult: Secondary | ICD-10-CM

## 2022-04-22 DIAGNOSIS — E875 Hyperkalemia: Secondary | ICD-10-CM | POA: Diagnosis present

## 2022-04-22 DIAGNOSIS — R7989 Other specified abnormal findings of blood chemistry: Secondary | ICD-10-CM | POA: Diagnosis present

## 2022-04-22 DIAGNOSIS — Z7951 Long term (current) use of inhaled steroids: Secondary | ICD-10-CM

## 2022-04-22 DIAGNOSIS — E039 Hypothyroidism, unspecified: Secondary | ICD-10-CM | POA: Diagnosis present

## 2022-04-22 DIAGNOSIS — I2510 Atherosclerotic heart disease of native coronary artery without angina pectoris: Secondary | ICD-10-CM | POA: Diagnosis present

## 2022-04-22 DIAGNOSIS — I252 Old myocardial infarction: Secondary | ICD-10-CM

## 2022-04-22 DIAGNOSIS — R778 Other specified abnormalities of plasma proteins: Secondary | ICD-10-CM

## 2022-04-22 DIAGNOSIS — I10 Essential (primary) hypertension: Secondary | ICD-10-CM

## 2022-04-22 DIAGNOSIS — E872 Acidosis, unspecified: Secondary | ICD-10-CM

## 2022-04-22 DIAGNOSIS — E1159 Type 2 diabetes mellitus with other circulatory complications: Secondary | ICD-10-CM

## 2022-04-22 DIAGNOSIS — J449 Chronic obstructive pulmonary disease, unspecified: Secondary | ICD-10-CM | POA: Diagnosis present

## 2022-04-22 DIAGNOSIS — E1122 Type 2 diabetes mellitus with diabetic chronic kidney disease: Secondary | ICD-10-CM | POA: Diagnosis present

## 2022-04-22 DIAGNOSIS — Z7984 Long term (current) use of oral hypoglycemic drugs: Secondary | ICD-10-CM

## 2022-04-22 DIAGNOSIS — Z951 Presence of aortocoronary bypass graft: Secondary | ICD-10-CM | POA: Diagnosis not present

## 2022-04-22 DIAGNOSIS — E871 Hypo-osmolality and hyponatremia: Secondary | ICD-10-CM | POA: Diagnosis not present

## 2022-04-22 DIAGNOSIS — R338 Other retention of urine: Secondary | ICD-10-CM | POA: Diagnosis present

## 2022-04-22 DIAGNOSIS — D638 Anemia in other chronic diseases classified elsewhere: Secondary | ICD-10-CM | POA: Diagnosis not present

## 2022-04-22 DIAGNOSIS — I25110 Atherosclerotic heart disease of native coronary artery with unstable angina pectoris: Secondary | ICD-10-CM | POA: Diagnosis not present

## 2022-04-22 DIAGNOSIS — Z79899 Other long term (current) drug therapy: Secondary | ICD-10-CM

## 2022-04-22 DIAGNOSIS — N401 Enlarged prostate with lower urinary tract symptoms: Secondary | ICD-10-CM | POA: Diagnosis present

## 2022-04-22 DIAGNOSIS — E785 Hyperlipidemia, unspecified: Secondary | ICD-10-CM | POA: Diagnosis present

## 2022-04-22 DIAGNOSIS — I255 Ischemic cardiomyopathy: Secondary | ICD-10-CM | POA: Diagnosis present

## 2022-04-22 DIAGNOSIS — K219 Gastro-esophageal reflux disease without esophagitis: Secondary | ICD-10-CM | POA: Diagnosis present

## 2022-04-22 DIAGNOSIS — E11649 Type 2 diabetes mellitus with hypoglycemia without coma: Secondary | ICD-10-CM

## 2022-04-22 DIAGNOSIS — Z7982 Long term (current) use of aspirin: Secondary | ICD-10-CM

## 2022-04-22 DIAGNOSIS — E119 Type 2 diabetes mellitus without complications: Secondary | ICD-10-CM

## 2022-04-22 DIAGNOSIS — N400 Enlarged prostate without lower urinary tract symptoms: Secondary | ICD-10-CM | POA: Diagnosis not present

## 2022-04-22 DIAGNOSIS — F32A Depression, unspecified: Secondary | ICD-10-CM | POA: Diagnosis present

## 2022-04-22 DIAGNOSIS — Z8249 Family history of ischemic heart disease and other diseases of the circulatory system: Secondary | ICD-10-CM

## 2022-04-22 DIAGNOSIS — L03115 Cellulitis of right lower limb: Secondary | ICD-10-CM

## 2022-04-22 DIAGNOSIS — E876 Hypokalemia: Secondary | ICD-10-CM | POA: Diagnosis not present

## 2022-04-22 LAB — CBC WITH DIFFERENTIAL/PLATELET
Abs Immature Granulocytes: 0.4 10*3/uL — ABNORMAL HIGH (ref 0.00–0.07)
Band Neutrophils: 20 %
Basophils Absolute: 0.1 10*3/uL (ref 0.0–0.1)
Basophils Relative: 1 %
Eosinophils Absolute: 0 10*3/uL (ref 0.0–0.5)
Eosinophils Relative: 0 %
HCT: 30.1 % — ABNORMAL LOW (ref 39.0–52.0)
Hemoglobin: 9.6 g/dL — ABNORMAL LOW (ref 13.0–17.0)
Lymphocytes Relative: 12 %
Lymphs Abs: 1.7 10*3/uL (ref 0.7–4.0)
MCH: 29.6 pg (ref 26.0–34.0)
MCHC: 31.9 g/dL (ref 30.0–36.0)
MCV: 92.9 fL (ref 80.0–100.0)
Metamyelocytes Relative: 2 %
Monocytes Absolute: 1 10*3/uL (ref 0.1–1.0)
Monocytes Relative: 7 %
Myelocytes: 1 %
Neutro Abs: 10.7 10*3/uL — ABNORMAL HIGH (ref 1.7–7.7)
Neutrophils Relative %: 57 %
Platelets: 216 10*3/uL (ref 150–400)
RBC: 3.24 MIL/uL — ABNORMAL LOW (ref 4.22–5.81)
RDW: 13.2 % (ref 11.5–15.5)
WBC: 13.9 10*3/uL — ABNORMAL HIGH (ref 4.0–10.5)
nRBC: 0 % (ref 0.0–0.2)

## 2022-04-22 LAB — URINALYSIS, ROUTINE W REFLEX MICROSCOPIC
Bacteria, UA: NONE SEEN
Bilirubin Urine: NEGATIVE
Glucose, UA: NEGATIVE mg/dL
Ketones, ur: NEGATIVE mg/dL
Leukocytes,Ua: NEGATIVE
Nitrite: NEGATIVE
Protein, ur: 100 mg/dL — AB
Specific Gravity, Urine: 1.015 (ref 1.005–1.030)
pH: 5 (ref 5.0–8.0)

## 2022-04-22 LAB — COMPREHENSIVE METABOLIC PANEL
ALT: 24 U/L (ref 0–44)
AST: 64 U/L — ABNORMAL HIGH (ref 15–41)
Albumin: 2.6 g/dL — ABNORMAL LOW (ref 3.5–5.0)
Alkaline Phosphatase: 71 U/L (ref 38–126)
Anion gap: 8 (ref 5–15)
BUN: 58 mg/dL — ABNORMAL HIGH (ref 6–20)
CO2: 26 mmol/L (ref 22–32)
Calcium: 7.4 mg/dL — ABNORMAL LOW (ref 8.9–10.3)
Chloride: 102 mmol/L (ref 98–111)
Creatinine, Ser: 2.45 mg/dL — ABNORMAL HIGH (ref 0.61–1.24)
GFR, Estimated: 30 mL/min — ABNORMAL LOW (ref >60)
Glucose, Bld: 120 mg/dL — ABNORMAL HIGH (ref 70–99)
Potassium: 5.2 mmol/L — ABNORMAL HIGH (ref 3.5–5.1)
Sodium: 136 mmol/L (ref 135–145)
Total Bilirubin: 1.1 mg/dL (ref 0.3–1.2)
Total Protein: 5.8 g/dL — ABNORMAL LOW (ref 6.5–8.1)

## 2022-04-22 LAB — TROPONIN I (HIGH SENSITIVITY)
Troponin I (High Sensitivity): 110 ng/L (ref <18)
Troponin I (High Sensitivity): 116 ng/L (ref <18)

## 2022-04-22 LAB — PROTIME-INR
INR: 1.6 — ABNORMAL HIGH (ref 0.8–1.2)
Prothrombin Time: 18.6 seconds — ABNORMAL HIGH (ref 11.4–15.2)

## 2022-04-22 LAB — LACTIC ACID, PLASMA
Lactic Acid, Venous: 4.1 mmol/L (ref 0.5–1.9)
Lactic Acid, Venous: 2.6 mmol/L (ref 0.5–1.9)

## 2022-04-22 IMAGING — DX DG CHEST 1V PORT
1 series · 1 of 1 positions shown · non-contrast
Comparison: Chest x-ray [DATE]

CLINICAL DATA: Sepsis.

EXAM:
PORTABLE CHEST 1 VIEW

[chest ap]
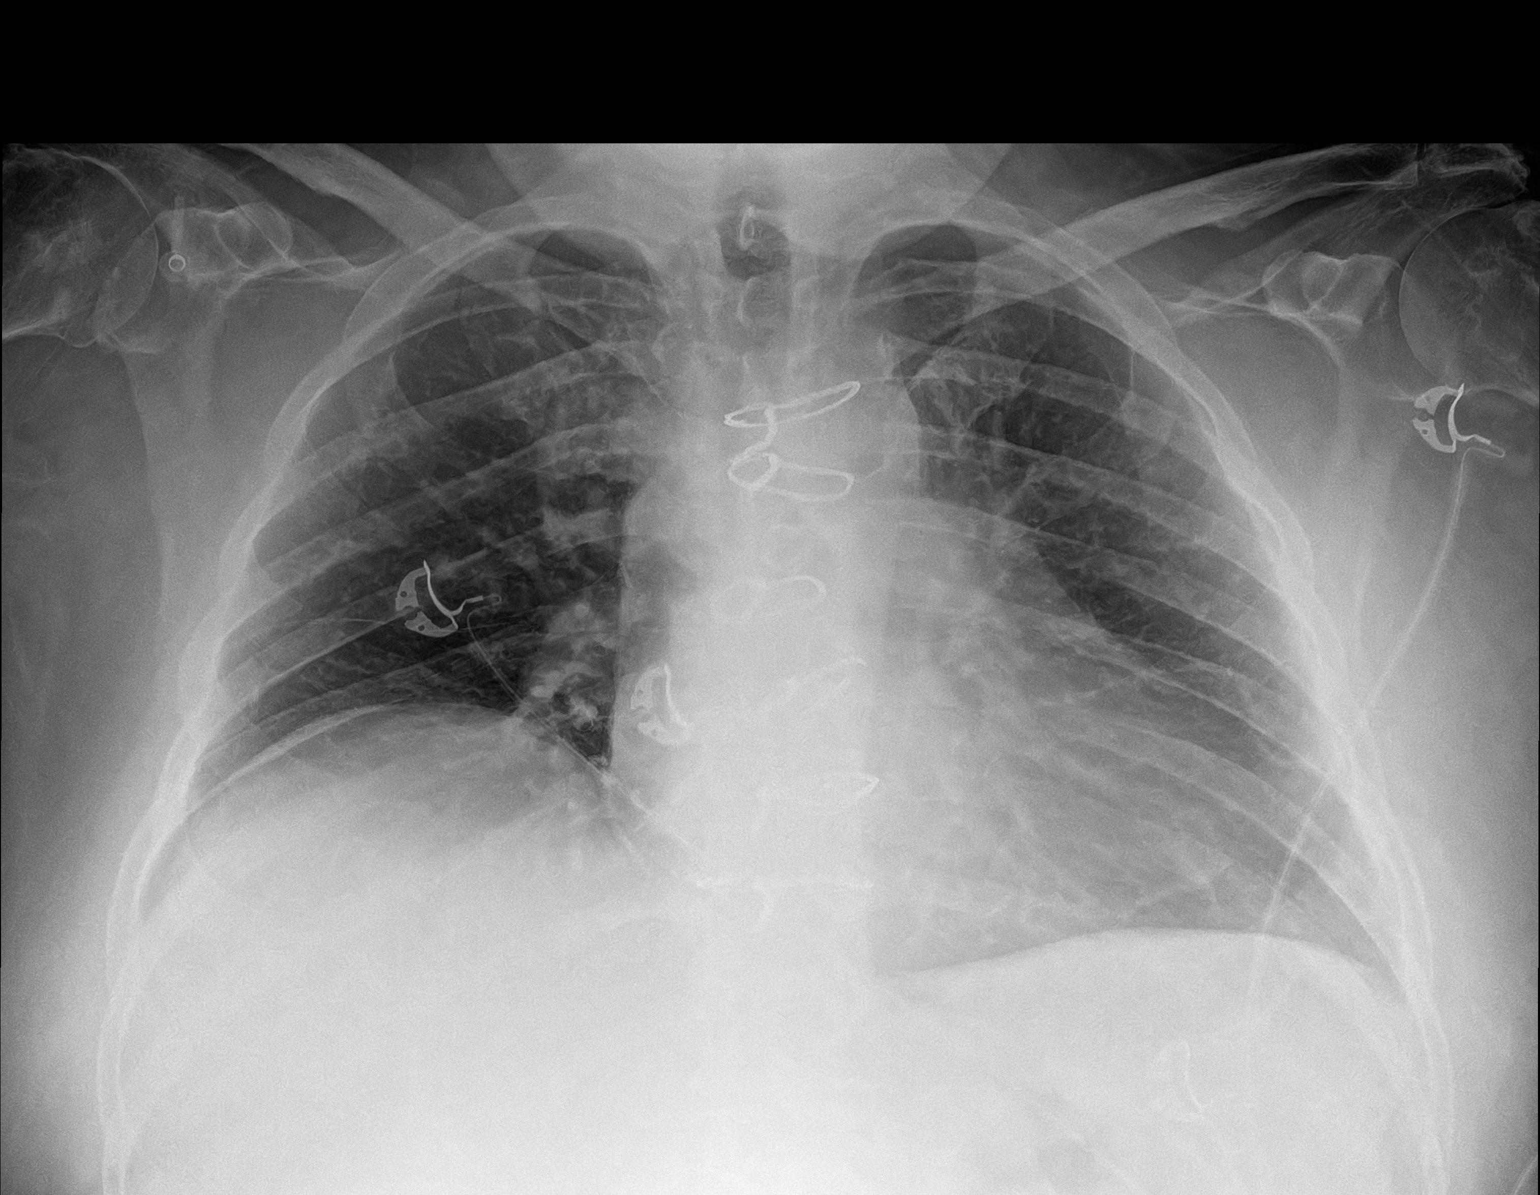

[1 of 1 positions shown; findings below may reference images not displayed]

FINDINGS: Median sternotomy wires are present. Lung volumes are low. The heart
size and mediastinal contours are within normal limits. Both lungs
are clear. The visualized skeletal structures are unremarkable.
IMPRESSION: No active disease.

## 2022-04-22 MED ORDER — INSULIN ASPART 100 UNIT/ML IJ SOLN
0.0000 [IU] | Freq: Every day | INTRAMUSCULAR | Status: DC
  Administered 2022-04-27 – 2022-05-01 (×3): 2 [IU] via SUBCUTANEOUS

## 2022-04-22 MED ORDER — OXYCODONE-ACETAMINOPHEN 5-325 MG PO TABS
1.0000 | ORAL_TABLET | Freq: Four times a day (QID) | ORAL | Status: DC | PRN
  Administered 2022-04-22 – 2022-05-02 (×26): 1 via ORAL
  Filled 2022-04-22 (×27): qty 1

## 2022-04-22 MED ORDER — ONDANSETRON HCL 4 MG PO TABS
4.0000 mg | ORAL_TABLET | Freq: Four times a day (QID) | ORAL | Status: DC | PRN
  Administered 2022-04-30: 4 mg via ORAL
  Filled 2022-04-22: qty 1

## 2022-04-22 MED ORDER — VANCOMYCIN HCL IN DEXTROSE 1-5 GM/200ML-% IV SOLN
1000.0000 mg | INTRAVENOUS | Status: DC
Start: 2022-04-23 — End: 2022-04-24
  Administered 2022-04-23: 1000 mg via INTRAVENOUS
  Filled 2022-04-22: qty 200

## 2022-04-22 MED ORDER — MOMETASONE FURO-FORMOTEROL FUM 200-5 MCG/ACT IN AERO
2.0000 | INHALATION_SPRAY | Freq: Two times a day (BID) | RESPIRATORY_TRACT | Status: DC
  Administered 2022-04-23 – 2022-05-02 (×18): 2 via RESPIRATORY_TRACT
  Filled 2022-04-22 (×3): qty 8.8

## 2022-04-22 MED ORDER — INSULIN ASPART 100 UNIT/ML IJ SOLN
0.0000 [IU] | Freq: Three times a day (TID) | INTRAMUSCULAR | Status: DC
  Administered 2022-04-23: 3 [IU] via SUBCUTANEOUS
  Administered 2022-04-24 – 2022-04-25 (×3): 2 [IU] via SUBCUTANEOUS
  Administered 2022-04-25: 3 [IU] via SUBCUTANEOUS
  Administered 2022-04-26: 2 [IU] via SUBCUTANEOUS
  Administered 2022-04-26 (×2): 3 [IU] via SUBCUTANEOUS
  Administered 2022-04-27: 2 [IU] via SUBCUTANEOUS
  Administered 2022-04-27: 5 [IU] via SUBCUTANEOUS
  Administered 2022-04-27: 3 [IU] via SUBCUTANEOUS
  Administered 2022-04-28 (×2): 2 [IU] via SUBCUTANEOUS
  Administered 2022-04-28 – 2022-04-29 (×2): 3 [IU] via SUBCUTANEOUS
  Administered 2022-04-29 – 2022-04-30 (×4): 2 [IU] via SUBCUTANEOUS
  Administered 2022-04-30 – 2022-05-01 (×2): 3 [IU] via SUBCUTANEOUS
  Administered 2022-05-01: 2 [IU] via SUBCUTANEOUS
  Administered 2022-05-01: 3 [IU] via SUBCUTANEOUS
  Administered 2022-05-02: 2 [IU] via SUBCUTANEOUS

## 2022-04-22 MED ORDER — SODIUM CHLORIDE 0.9 % IV BOLUS
500.0000 mL | Freq: Once | INTRAVENOUS | Status: AC
  Administered 2022-04-22: 500 mL via INTRAVENOUS

## 2022-04-22 MED ORDER — VANCOMYCIN HCL 2000 MG/400ML IV SOLN
2000.0000 mg | Freq: Once | INTRAVENOUS | Status: AC
  Administered 2022-04-22: 2000 mg via INTRAVENOUS
  Filled 2022-04-22: qty 400

## 2022-04-22 MED ORDER — SODIUM CHLORIDE 0.9 % IV BOLUS
1000.0000 mL | Freq: Once | INTRAVENOUS | Status: AC
  Administered 2022-04-22: 1000 mL via INTRAVENOUS

## 2022-04-22 MED ORDER — SODIUM CHLORIDE 0.9 % IV BOLUS
1000.0000 mL | Freq: Once | INTRAVENOUS | Status: AC
Start: 2022-04-22 — End: 2022-04-22
  Administered 2022-04-22: 1000 mL via INTRAVENOUS

## 2022-04-22 MED ORDER — SODIUM CHLORIDE 0.9 % IV SOLN
1.0000 g | Freq: Once | INTRAVENOUS | Status: AC
  Administered 2022-04-22: 1 g via INTRAVENOUS
  Filled 2022-04-22: qty 10

## 2022-04-22 MED ORDER — HEPARIN SODIUM (PORCINE) 5000 UNIT/ML IJ SOLN
5000.0000 [IU] | Freq: Three times a day (TID) | INTRAMUSCULAR | Status: DC
  Administered 2022-04-23 – 2022-05-02 (×28): 5000 [IU] via SUBCUTANEOUS
  Filled 2022-04-22 (×28): qty 1

## 2022-04-22 MED ORDER — AMITRIPTYLINE HCL 25 MG PO TABS
25.0000 mg | ORAL_TABLET | Freq: Every day | ORAL | Status: DC
  Administered 2022-04-23 – 2022-05-01 (×10): 25 mg via ORAL
  Filled 2022-04-22 (×10): qty 1

## 2022-04-22 MED ORDER — SODIUM CHLORIDE 0.9 % IV SOLN: INTRAVENOUS | Status: DC

## 2022-04-22 MED ORDER — LEVOTHYROXINE SODIUM 50 MCG PO TABS: 75.0000 ug | ORAL_TABLET | Freq: Every day | ORAL | Status: DC

## 2022-04-22 MED ORDER — ACETAMINOPHEN 650 MG RE SUPP: 650.0000 mg | Freq: Four times a day (QID) | RECTAL | Status: DC | PRN

## 2022-04-22 MED ORDER — TAMSULOSIN HCL 0.4 MG PO CAPS
0.8000 mg | ORAL_CAPSULE | Freq: Every day | ORAL | Status: DC
  Administered 2022-04-23 – 2022-05-02 (×10): 0.8 mg via ORAL
  Filled 2022-04-22 (×10): qty 2

## 2022-04-22 MED ORDER — PANTOPRAZOLE SODIUM 40 MG PO TBEC
40.0000 mg | DELAYED_RELEASE_TABLET | Freq: Every day | ORAL | Status: DC
  Administered 2022-04-23 – 2022-05-02 (×10): 40 mg via ORAL
  Filled 2022-04-22 (×9): qty 1

## 2022-04-22 MED ORDER — SODIUM CHLORIDE 0.9 % IV SOLN
2.0000 g | INTRAVENOUS | Status: AC
  Administered 2022-04-23 – 2022-04-29 (×7): 2 g via INTRAVENOUS
  Filled 2022-04-22 (×7): qty 20

## 2022-04-22 MED ORDER — ONDANSETRON HCL 4 MG/2ML IJ SOLN: 4.0000 mg | Freq: Four times a day (QID) | INTRAMUSCULAR | Status: DC | PRN

## 2022-04-22 MED ORDER — ASPIRIN 81 MG PO TBEC
81.0000 mg | DELAYED_RELEASE_TABLET | Freq: Every day | ORAL | Status: DC
  Administered 2022-04-23 – 2022-05-02 (×10): 81 mg via ORAL
  Filled 2022-04-22 (×10): qty 1

## 2022-04-22 MED ORDER — POLYETHYLENE GLYCOL 3350 17 G PO PACK
17.0000 g | PACK | Freq: Every day | ORAL | Status: DC | PRN
  Administered 2022-04-28 – 2022-05-02 (×3): 17 g via ORAL
  Filled 2022-04-22 (×3): qty 1

## 2022-04-22 MED ORDER — LEVOTHYROXINE SODIUM 75 MCG PO TABS
75.0000 ug | ORAL_TABLET | Freq: Every day | ORAL | Status: DC
  Administered 2022-04-23 – 2022-05-02 (×10): 75 ug via ORAL
  Filled 2022-04-22 (×10): qty 1

## 2022-04-22 MED ORDER — INSULIN ASPART PROT & ASPART (70-30 MIX) 100 UNIT/ML ~~LOC~~ SUSP
8.0000 [IU] | Freq: Two times a day (BID) | SUBCUTANEOUS | Status: DC
Start: 2022-04-23 — End: 2022-05-02
  Administered 2022-04-23 – 2022-05-02 (×19): 8 [IU] via SUBCUTANEOUS
  Filled 2022-04-22 (×2): qty 10

## 2022-04-22 MED ORDER — ACETAMINOPHEN 325 MG PO TABS
650.0000 mg | ORAL_TABLET | Freq: Four times a day (QID) | ORAL | Status: DC | PRN
  Administered 2022-04-23 – 2022-04-30 (×5): 650 mg via ORAL
  Filled 2022-04-22 (×6): qty 2

## 2022-04-22 MED ORDER — VENLAFAXINE HCL ER 75 MG PO CP24
225.0000 mg | ORAL_CAPSULE | Freq: Every day | ORAL | Status: DC
  Administered 2022-04-23 – 2022-05-02 (×10): 225 mg via ORAL
  Filled 2022-04-22 (×10): qty 3

## 2022-04-22 NOTE — ED Provider Notes (Signed)
[facility] EMERGENCY DEPARTMENT Provider Note   CSN: [id number] Arrival date & time: 04/22/22  1527     History {Add pertinent medical, surgical, social history, OB history to HPI:1} Chief Complaint  Patient presents with   Cellulitis    Shane Chambers is a 61 y.o. male presenting emerged department with complaint of generalized fatigue and leg swelling.  The patient is here from [facility].  He says he has felt "malaised" for the past several days.  He has chronic edema and redness in his legs, reports that he has been treated repeatedly for "cellulitis" that the redness never truly goes away.  He denies fevers or chills.  He is on losartan 50 mg daily for blood pressure  HPI     Home Medications Prior to Admission medications   Medication Sig Start Date End Date Taking? Authorizing Provider  acetaminophen (TYLENOL) 500 MG tablet Take 1,000 mg by mouth every 8 (eight) hours as needed for fever or moderate pain.    [provider]  albuterol (VENTOLIN HFA) 108 (90 Base) MCG/ACT inhaler Inhale 1-2 puffs into the lungs every 6 (six) hours as needed for wheezing or shortness of breath. Patient taking differently: Inhale 2 puffs into the lungs every 6 (six) hours as needed for wheezing or shortness of breath. 05/24/20   Rigoberto Noel, MD  amitriptyline (ELAVIL) 25 MG tablet Take 25 mg by mouth at bedtime. 12/18/21   [provider]  aspirin 81 MG EC tablet Take 1 tablet (81 mg total) by mouth daily with breakfast. 11/29/20   Roxan Hockey, MD  atorvastatin (LIPITOR) 80 MG tablet Take 80 mg by mouth at bedtime.    [provider]  budesonide-formoterol (SYMBICORT) 160-4.5 MCG/ACT inhaler Inhale 2 puffs into the lungs in the morning and at bedtime. 05/24/20   Rigoberto Noel, MD  Cholecalciferol (VITAMIN D) 50 MCG (2000 UT) tablet Take 2,000 Units by mouth daily.    [provider]  gabapentin (NEURONTIN) 300 MG capsule Take 900 mg by mouth 2 (two)  times daily.    [provider]  insulin aspart protamine- aspart (NOVOLOG MIX 70/30) (70-30) 100 UNIT/ML injection Inject 15 Units into the skin 2 (two) times daily with a meal.    [provider]  levothyroxine (SYNTHROID) 75 MCG tablet Take 75 mcg by mouth daily before breakfast.    [provider]  loperamide HCl (IMODIUM) 2 MG/15ML solution Take 4 mg by mouth every 6 (six) hours as needed for diarrhea or loose stools. 48m as needed    [provider]  losartan (COZAAR) 50 MG tablet Take 50 mg by mouth daily.    [provider]  magnesium oxide (MAG-OX) 400 MG tablet Take 400 mg by mouth daily.    [provider]  metFORMIN (GLUCOPHAGE) 1000 MG tablet Take 1,000 mg by mouth 2 (two) times daily.    [provider]  metoprolol succinate (TOPROL-XL) 25 MG 24 hr tablet Take 0.5 tablets (12.5 mg total) by mouth 2 (two) times daily. 08/21/20   QVerta Ellen, NP  omeprazole (PRILOSEC) 40 MG capsule Take 40 mg by mouth daily. 01/01/22   [provider]  polyethylene glycol (MIRALAX / GLYCOLAX) 17 g packet Take 17 g by mouth daily as needed for moderate constipation.    [provider]  polyvinyl alcohol (LIQUIFILM TEARS) 1.4 % ophthalmic solution Place 1 drop into both eyes daily.    [provider]  potassium chloride  SA (KLOR-CON) 20 MEQ tablet Take 1 tablet (20 mEq total) by mouth daily. 08/21/20   Verta Ellen., NP  tamsulosin (FLOMAX) 0.4 MG CAPS capsule Take 0.8 mg by mouth daily.    [provider]  torsemide (DEMADEX) 20 MG tablet Take 3 tablets (60 mg total) by mouth daily. & 20 mg daily as needed for 3 lb weight gain in 24 hours or 5 lbs in one week 07/25/21   Verta Ellen., NP  traMADol (ULTRAM) 50 MG tablet Take 50 mg by mouth every 6 (six) hours as needed (pain).    [provider]  venlafaxine XR (EFFEXOR-XR) 75 MG 24 hr capsule Take 3 capsules by mouth daily. 01/21/22    [provider]      Allergies    Ace inhibitors, Other, and Penicillins    Review of Systems   Review of Systems  Physical Exam Updated Vital Signs BP (!) 86/58   Pulse 93   Temp 98.3 F (36.8 C)   Resp (!) 22   Ht '5\' 9"'$  (1.753 m)   Wt 125.6 kg   SpO2 92%   BMI 40.91 kg/m  Physical Exam Constitutional:      General: He is not in acute distress. HENT:     Head: Normocephalic and atraumatic.  Eyes:     Conjunctiva/sclera: Conjunctivae normal.     Pupils: Pupils are equal, round, and reactive to light.  Cardiovascular:     Rate and Rhythm: Normal rate and regular rhythm.  Pulmonary:     Effort: Pulmonary effort is normal. No respiratory distress.  Musculoskeletal:     Comments: Patient is chronic pitting edema of the lower extremities with weeping edema, overlying redness with some warmth that is slightly more pronounced on the left extremity and the right extremity  Skin:    General: Skin is warm and dry.  Neurological:     General: No focal deficit present.     Mental Status: He is alert. Mental status is at baseline.  Psychiatric:        Mood and Affect: Mood normal.        Behavior: Behavior normal.     ED Results / Procedures / Treatments   Labs (all labs ordered are listed, but only abnormal results are displayed) Labs Reviewed  CULTURE, BLOOD (ROUTINE X 2)  CULTURE, BLOOD (ROUTINE X 2)  COMPREHENSIVE METABOLIC PANEL  LACTIC ACID, PLASMA  LACTIC ACID, PLASMA  CBC WITH DIFFERENTIAL/PLATELET  PROTIME-INR  URINALYSIS, ROUTINE W REFLEX MICROSCOPIC    EKG None  Radiology No results found.  Procedures Procedures  {Document cardiac monitor, telemetry assessment procedure when appropriate:1}  Medications Ordered in ED Medications - No data to display  ED Course/ Medical Decision Making/ A&P                           Medical Decision Making  This patient presents to the ED with concern for generalized fatigue, redness of the leg. This  involves an extensive number of treatment options, and is a complaint that carries with it a high risk of complications and morbidity.  The differential diagnosis includes stasis dermatitis is most likely cause of this ongoing chronic bilateral symmetrical redness of the legs, however cannot exclude the possibility of superimposed cellulitis or infection given that he does have open wounds on the legs from the edema, and he is diabetic, with likely poor blood flow, and I do  note that the left leg appears more erythematous and warm than the right.  He is mildly hypotensive on arrival.  He will be given a small fluid bolus.  He is otherwise afebrile not tachycardic, does not show signs of sepsis at this moment.  Blood cultures were ordered by triage staff.  We are awaiting white blood cell count.  Given his generalized malaise and his slightly lower blood pressure, I have also ordered chest x-ray and UA as part of an infectious work-up in the ED.  Co-morbidities that complicate the patient evaluation: History of diabetes at high risk for wound infections,  Additional history obtained from EMS on arrival  External records from outside source obtained and reviewed including last echocardiogram in September 2021 showing an EF that was normal 55 to 70%, grade 2 diastolic dysfunction, moderate calcification of the aortic valve, moderate thickening of aortic valve.  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 MRSA positive on prior screening  *  I ordered and personally interpreted labs.  The pertinent results include:  ***  I ordered imaging studies including x-ray of the chest I independently visualized and interpreted imaging which showed *** I agree with the radiologist  interpretation  The patient was maintained on a cardiac monitor.  I personally viewed and interpreted the cardiac monitored which showed an underlying rhythm of: ***  Per my interpretation the patient's ECG shows normal sinus rhythm no acute ischemic findings  I ordered medication including IV fluid bolus for hypotension   Test Considered: ***  I requested consultation with the ***,  and discussed lab and imaging findings as well as pertinent plan - they recommend: ***  After the interventions noted above, I reevaluated the patient and found that they have: {resolved/improved/worsened:23923::"improved"}  Social Determinants of Health:***  Dispostion:  After consideration of the diagnostic results and the patients response to treatment, I feel that the patent would benefit from ***.   {Document critical care time when appropriate:1} {Document review of labs and clinical decision tools ie heart score, Chads2Vasc2 etc:1}  {Document your independent review of radiology images, and any outside records:1} {Document your discussion with family members, caretakers, and with consultants:1} {Document social determinants of health affecting pt's care:1} {Document your decision making why or why not admission, treatments were needed:1} Final Clinical Impression(s) / ED Diagnoses Final diagnoses:  None    Rx / DC Orders ED Discharge Orders     None

## 2022-04-22 NOTE — Assessment & Plan Note (Addendum)
Source of infection likely bilateral lower extremity cellulitis.  Meeting severe sepsis criteria, with leukocytosis of 13.9, hypotension that has improved with IV fluids, blood pressure down to 86/58, significant lactic acidosis 4.1 > 2.6 after fluids, and acute kidney injury. (He is on beta-blocker metoprolol which may be masking tachycardia). -  UA and chest x-ray not suggestive of infection.  Prior hospitalization for cellulitis 11/2020. -Broad-spectrum antibiotics IV vancomycin and ceftriaxone -Follow-up blood cultures - Add-on urine cultures -2.5 L bolus given, continue LR 100cc/hr x 15hrs

## 2022-04-22 NOTE — Assessment & Plan Note (Signed)
Troponin 110 > 106, flats.  In the setting of severe sepsis hypotension.  History of CABG.  EKG without acute abnormalities.  No chest pain.

## 2022-04-22 NOTE — Assessment & Plan Note (Signed)
Compensated, but with severe sepsis.  Last echo 2021 EF 55 to 60% with grade 2 diastolic dysfunction. -Diuretics, hydrate.

## 2022-04-22 NOTE — Assessment & Plan Note (Addendum)
Creatinine 2.45, recent baseline  ~ 1.3-1.6.  In the setting of severe sepsis and hypotension, diuretics and ARB. -Hydrate -Hold home losartan, torsemide

## 2022-04-22 NOTE — ED Triage Notes (Addendum)
Pt c/o BLE redness/swelling/pain. Pt also c/o pain to sacrum. CBG-137. Temp 98.4. Pt resident at the [facility] [place] [facility].

## 2022-04-22 NOTE — Assessment & Plan Note (Addendum)
Hypotensive, blood pressure down to 39Q systolic. -Hold torsemide, metoprolol, losartan.

## 2022-04-22 NOTE — H&P (Addendum)
History and Physical    Shane Chambers FKC:[id number] DOB: 05/03/1961 DOA: 04/22/2022  PCP: Caprice Renshaw, MD  Patient coming from: [facility]  I have personally briefly reviewed patient's old medical records in [place]  Chief Complaint: Bilateral lower extremity pain swelling and redness.  HPI: Shane Chambers is a 61 y.o. male with medical history significant for diabetes mellitus, systolic and diastolic CHF, depression, CABG x5, COPD OSA, rectal cancer. Patient was brought to the ED reports of bilateral lower extremity pain swelling and redness.  Patient tells me he has had chronic pain in his lower extremities but the pain was worse yesterday.  Also reports feeling ill.  Denies vomiting, no loose stools no abdominal pain, denies chest pain, no cough no difficulty breathing.  No neck pain or stiffness, denies urinary symptoms.  Reports pain in his buttock area, but he denies any ulcers involving his back or buttock area.  ED Course: Temperature 98.3.  Heart rate 80s to 90s.  Respiratory rate 12-25.  Blood pressure systolic down to 74/94.  O2 sats greater than 92% on room air.  WBC 13.9.  Lactic acidosis 4.1 > 2.6.  Troponin 110 > 116.  Creatinine elevated 2.45.  UA Not suggestive of UTI.  Chest x-ray without acute abnormality.  Blood cultures obtained IV vancomycin and ceftriaxone started. Hospitalist to admit for sepsis.  Review of Systems: As per HPI all other systems reviewed and negative.  Past Medical History:  Diagnosis Date   Anemia    Arthritis    CAD (coronary artery disease)    a. s/p CABG in 03/2020 with LIMA-LAD, RIMA-PL, [id number]-OM1   Cancer ([place])    rectal   Cellulitis    COPD (chronic obstructive pulmonary disease) ([place])    Depression    Diabetes mellitus without complication (HCC)    GERD (gastroesophageal reflux disease)    History of kidney stones    Hyperlipidemia 12/01/2019   Hypertension    Hypothyroidism    Ischemic cardiomyopathy     a. EF 20-25% by echo in 02/2020 b. at 40% by echo in 03/2020 c. EF normalized to 60-65% by echo in 04/2020   Myocardial infarction [facility])    Peripheral vascular disease ([place])    Sleep apnea    Type 2 diabetes mellitus ([place])     Past Surgical History:  Procedure Laterality Date   BIOPSY  07/18/2021   Procedure: BIOPSY;  Surgeon: Eloise Harman, DO;  [facility];  Service: Endoscopy;;   BIOPSY  01/09/2022   Procedure: BIOPSY;  Surgeon: Eloise Harman, DO;  [facility];  Service: Endoscopy;;   COLONOSCOPY WITH PROPOFOL N/A 07/18/2021   Carver: 15 millimeter polyp removed from the sigmoid colon, 5 mm polyp removed from sigmoid colon.  Nonbleeding internal hemorrhoids.  Significant looping of the colon. sigmoid path showed invasive colonic adenocarcinoma involving tubular adenoma (invades to depth of 37m, carcinoma 170mfrom margin, no lymphovascular invasion, no poorly differentiated component.   CORONARY ARTERY BYPASS GRAFT N/A 03/13/2020   Procedure: CORONARY ARTERY BYPASS GRAFTING (CABG) times five using bilateral Internal mammary arteries and left radial artery;  Surgeon: AtWonda OldsMD;  [facility] [facility] Service: Open [facility] Surgery;  Laterality: N/A;   DENTAL SURGERY     ESOPHAGOGASTRODUODENOSCOPY (EGD) WITH PROPOFOL N/A 07/18/2021   Carver: 1 gastric polyp status post biopsy, gastritis. gastric bx with slight chronic inflammation and no H.pyori. GEJ polypectomy with mild inflammation only  FLEXIBLE SIGMOIDOSCOPY N/A 08/26/2021   Carver: Nonbleeding internal hemorrhoids.  15 mm ulcers from previous polypectomy found in the rectum.  No evidence of previous polyp.  Located 5 to 8 cm from anal verge.   FLEXIBLE SIGMOIDOSCOPY N/A 01/09/2022   Procedure: FLEXIBLE SIGMOIDOSCOPY;  Surgeon: Eloise Harman, DO;  [facility];  Service: Endoscopy;  Laterality: N/A;   POLYPECTOMY  07/18/2021   Procedure: POLYPECTOMY INTESTINAL;  Surgeon: Eloise Harman, DO;  [facility];  Service: Endoscopy;;   RADIAL ARTERY HARVEST Left 03/13/2020   Procedure: [place],;  Surgeon: [name], MD;  Location: [place];  Service: Open Heart Surgery;  Laterality: Left;   RIGHT/LEFT HEART CATH AND CORONARY ANGIOGRAPHY N/A 03/07/2020   Procedure: RIGHT/LEFT HEART CATH AND CORONARY ANGIOGRAPHY;  Surgeon: Sherren Mocha, MD;  Location: [place] CV LAB;  Service: Cardiovascular;  Laterality: N/A;   SUBMUCOSAL LIFTING INJECTION  01/09/2022   Procedure: SUBMUCOSAL LIFTING INJECTION;  Surgeon: Eloise Harman, DO;  [facility];  Service: Endoscopy;;   SUBMUCOSAL TATTOO INJECTION  01/09/2022   Procedure: SUBMUCOSAL TATTOO INJECTION;  Surgeon: Eloise Harman, DO;  [facility];  Service: Endoscopy;;   TEE WITHOUT CARDIOVERSION N/A 03/13/2020   Procedure: TRANSESOPHAGEAL ECHOCARDIOGRAM (TEE);  Surgeon: [name], MD;  Location: [place];  Service: Open Heart Surgery;  Laterality: N/A;     reports that he quit smoking about 26 years ago. His smoking use included cigarettes. He smoked an average of 1.5 packs per day. He has never used smokeless tobacco. He reports that he does not currently use alcohol. He reports that he does not use drugs.  Allergies  Allergen Reactions   Ace Inhibitors Swelling and Cough    (Not on MAR at [facility] and [place])   Other Itching    [name]   Penicillins     Has patient had a PCN reaction causing immediate rash, facial/tongue/throat swelling, SOB or lightheadedness with hypotension: Unknown Has patient had a PCN reaction causing severe rash involving mucus membranes or skin necrosis: Unknown Has patient had a PCN reaction that required hospitalization: Unknown Has patient had a PCN reaction occurring within the last 10 years: Unknown If all of the above answers are "NO", then may proceed with Cephalosporin use.     Family History  Problem  Relation Age of Onset   Hypertension Mother     Prior to Admission medications   Medication Sig Start Date End Date Taking? Authorizing Provider  acetaminophen (TYLENOL) 500 MG tablet Take 1,000 mg by mouth every 8 (eight) hours as needed for fever or moderate pain.   Yes [provider]  albuterol (VENTOLIN HFA) 108 (90 Base) MCG/ACT inhaler Inhale 1-2 puffs into the lungs every 6 (six) hours as needed for wheezing or shortness of breath. Patient taking differently: Inhale 2 puffs into the lungs every 6 (six) hours as needed for wheezing or shortness of breath. 05/24/20  Yes Rigoberto Noel, MD  amitriptyline (ELAVIL) 25 MG tablet Take 25 mg by mouth at bedtime. 12/18/21  Yes [provider]  aspirin 81 MG EC tablet Take 1 tablet (81 mg total) by mouth daily with breakfast. 11/29/20  Yes Torie Priebe, [place], MD  atorvastatin (LIPITOR) 80 MG tablet Take 80 mg by mouth at bedtime.   Yes [provider]  budesonide-formoterol (SYMBICORT) 160-4.5 MCG/ACT inhaler Inhale 2 puffs into the lungs in the morning and at bedtime.  05/24/20  Yes Rigoberto Noel, MD  Cholecalciferol (VITAMIN D) 50 MCG (2000 UT) tablet Take 2,000 Units by mouth daily.   Yes [provider]  gabapentin (NEURONTIN) 300 MG capsule Take 900 mg by mouth 2 (two) times daily.   Yes [provider]  levothyroxine (SYNTHROID) 75 MCG tablet Take 75 mcg by mouth daily before breakfast.   Yes [provider]  loperamide HCl (IMODIUM) 2 MG/15ML solution Take 4 mg by mouth every 6 (six) hours as needed for diarrhea or loose stools. 17m as needed   Yes [provider]  losartan (COZAAR) 50 MG tablet Take 50 mg by mouth daily.   Yes [provider]  magnesium oxide (MAG-OX) 400 MG tablet Take 400 mg by mouth daily.   Yes [provider]  metFORMIN (GLUCOPHAGE) 1000 MG tablet Take 1,000 mg by mouth 2 (two) times daily.   Yes [provider]  metoprolol succinate  (TOPROL-XL) 25 MG 24 hr tablet Take 0.5 tablets (12.5 mg total) by mouth 2 (two) times daily. 08/21/20  Yes QVerta Ellen, NP  omeprazole (PRILOSEC) 40 MG capsule Take 40 mg by mouth daily. 01/01/22  Yes [provider]  polyethylene glycol (MIRALAX / GLYCOLAX) 17 g packet Take 17 g by mouth daily as needed for moderate constipation.   Yes [provider]  potassium chloride SA (KLOR-CON) 20 MEQ tablet Take 1 tablet (20 mEq total) by mouth daily. 08/21/20  Yes QVerta Ellen, NP  tamsulosin (FLOMAX) 0.4 MG CAPS capsule Take 0.8 mg by mouth daily.   Yes [provider]  torsemide (DEMADEX) 20 MG tablet Take 3 tablets (60 mg total) by mouth daily. & 20 mg daily as needed for 3 lb weight gain in 24 hours or 5 lbs in one week 07/25/21  Yes QVerta Ellen, NP  traMADol (ULTRAM) 50 MG tablet Take 50 mg by mouth every 6 (six) hours as needed (pain).   Yes [provider]  venlafaxine XR (EFFEXOR-XR) 75 MG 24 hr capsule Take 3 capsules by mouth daily. 01/21/22  Yes [provider]  insulin aspart protamine- aspart (NOVOLOG MIX 70/30) (70-30) 100 UNIT/ML injection Inject 15 Units into the skin 2 (two) times daily with a meal.    [provider]  polyvinyl alcohol (LIQUIFILM TEARS) 1.4 % ophthalmic solution Place 1 drop into both eyes daily.    [provider]    Physical Exam: Vitals:   04/22/22 2100 04/22/22 2110 04/22/22 2120 04/22/22 2130  BP: (!) 102/48 98/61 108/70 106/65  Pulse: 88 87 86 86  Resp: (!) 27 (!) 25 (!) 24 19  Temp:      SpO2: 96% 94% 93% 95%  Weight:      Height:        Constitutional: NAD, calm, comfortable Vitals:   04/22/22 2100 04/22/22 2110 04/22/22 2120 04/22/22 2130  BP: (!) 102/48 98/61 108/70 106/65  Pulse: 88 87 86 86  Resp: (!) 27 (!) 25 (!) 24 19  Temp:      SpO2: 96% 94% 93% 95%  Weight:      Height:       Eyes: PERRL, lids and conjunctivae normal ENMT: Mucous membranes are very  dry Neck: normal, supple, no masses, no thyromegaly Respiratory: clear to auscultation bilaterally, no wheezing, no crackles. Normal respiratory effort. No accessory muscle use.  Cardiovascular: Regular rate and rhythm, no murmurs / rubs / gallops.  1+ pitting extremity edema to  left lower extremity.  Chronic stasis changes to both lower extremities. Abdomen: no tenderness, no masses palpated. No hepatosplenomegaly. Bowel sounds positive.  Musculoskeletal: no clubbing / cyanosis. No joint deformity upper and lower extremities.  Skin: Open wounds to bilateral lower extremity worse on the lateral side of the left leg, with weeping, tenderness to palpation worse on the left, bilateral erythema also worse on the left. Neurologic: No apparent cranial nerve abnormality, moving extremities spontaneously. Psychiatric: Normal judgment and insight. Alert and oriented x 3. Normal mood.      Labs on Admission: I have personally reviewed following labs and imaging studies  CBC: Recent Labs  Lab 04/22/22 1637 04/22/22 1953  WBC 17.9* 13.9*  NEUTROABS PENDING 10.7*  HGB 10.4* 9.6*  HCT 31.9* 30.1*  MCV 91.7 92.9  PLT 257 921   Basic Metabolic Panel: Recent Labs  Lab 04/22/22 1953  NA 136  K 5.2*  CL 102  CO2 26  GLUCOSE 120*  BUN 58*  CREATININE 2.45*  CALCIUM 7.4*   GFR: Estimated Creatinine Clearance: 42.6 mL/min (A) (by C-G formula based on SCr of 2.45 mg/dL (H)). Liver Function Tests: Recent Labs  Lab 04/22/22 1953  AST 64*  ALT 24  ALKPHOS 71  BILITOT 1.1  PROT 5.8*  ALBUMIN 2.6*   Coagulation Profile: Recent Labs  Lab 04/22/22 1953  INR 1.6*   Urine analysis:    Component Value Date/Time   COLORURINE YELLOW 04/22/2022 1846   APPEARANCEUR HAZY (A) 04/22/2022 1846   LABSPEC 1.015 04/22/2022 1846   PHURINE 5.0 04/22/2022 1846   GLUCOSEU NEGATIVE 04/22/2022 1846   HGBUR SMALL (A) 04/22/2022 1846   BILIRUBINUR NEGATIVE 04/22/2022 1846   KETONESUR NEGATIVE  04/22/2022 1846   PROTEINUR 100 (A) 04/22/2022 1846   NITRITE NEGATIVE 04/22/2022 1846   LEUKOCYTESUR NEGATIVE 04/22/2022 1846    Radiological Exams on Admission: DG Chest Port 1 View  Result Date: 04/22/2022 CLINICAL DATA:  Sepsis. EXAM: PORTABLE CHEST 1 VIEW COMPARISON:  Chest x-ray 11/22/2020 FINDINGS: Median sternotomy wires are present. Lung volumes are low. The heart size and mediastinal contours are within normal limits. Both lungs are clear. The visualized skeletal structures are unremarkable. IMPRESSION: No active disease. Electronically Signed   By: Ronney Asters M.D.   On: 04/22/2022 17:14    EKG: Independently reviewed.  Sinus rhythm rate 92, QTc 475.  No significant changes changes from prior.  Assessment/Plan Principal Problem:   Severe sepsis (HCC) Active Problems:   Hypertension   Type 2 diabetes mellitus (HCC)   S/P CABG x 5   Bilateral lower leg cellulitis   Systolic and diastolic CHF, chronic (HCC)   Rectal cancer (HCC)   AKI (acute kidney injury) (HCC)   Elevated troponin  Assessment and Plan: * Severe sepsis (HCC) Source of infection likely bilateral lower extremity cellulitis.  Meeting severe sepsis criteria, with leukocytosis of 13.9, hypotension that has improved with IV fluids, blood pressure down to 86/58, significant lactic acidosis 4.1 > 2.6 after fluids, and acute kidney injury. (He is on beta-blocker metoprolol which may be masking tachycardia). -  UA and chest x-ray not suggestive of infection.  Prior hospitalization for cellulitis 11/2020. -Broad-spectrum antibiotics IV vancomycin and ceftriaxone -Follow-up blood cultures - Add-on urine cultures -2.5 L bolus given, continue LR 100cc/hr x 15hrs  Elevated troponin Troponin 110 > 106, flats.  In the setting of severe sepsis hypotension.  History of CABG.  EKG without acute abnormalities.  No chest pain.  AKI (acute kidney injury) (  [place]) Creatinine 2.45, recent baseline  ~ 1.3-1.6.  In the setting of  severe sepsis and hypotension, diuretics and ARB. -Hydrate -Hold home losartan, torsemide   Rectal cancer ([place]) Follows with Dr. Delton Coombes.  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  Currently under surveillance.     Systolic and diastolic CHF, chronic (HCC) Compensated, but with severe sepsis.  Last echo 2021 EF 55 to 60% with grade 2 diastolic dysfunction. -Diuretics, hydrate.  Bilateral lower leg cellulitis Bilateral lower extremity cellulitis with erythema, and tenderness, both worse on the left.  Presented with severe sepsis.  Going by epic photos, cellulitis today appears worse than when he was admitted 11/2020 for cellulitis.  -Left lower extremity venous Dopplers -IV vancomycin and ceftriaxone as patient is diabetic and they are open wounds  Type 2 diabetes mellitus ([place]) - HgbA1c - SSI- M -Resume home NovoLog at reduced dose 8 units twice daily in a.m. (home dose 15 units twice daily)  Hypertension Hypotensive, blood pressure down to 15Q systolic. -Hold torsemide, metoprolol, losartan.   DVT prophylaxis: Heparin Code Status: Full Family Communication: None at bedside Disposition Plan: ~/> 2 days Consults called: None Admission status: Inpt tele I certify that at the point of admission it is my clinical judgment that the patient will require inpatient hospital care spanning beyond 2 midnights from the point of admission due to high intensity of service, high risk for further deterioration and high frequency of surveillance required.    Author: Bethena Roys, MD 04/22/2022 10:25 PM  For on call review [email or web address].

## 2022-04-22 NOTE — ED Notes (Signed)
Update give to facility

## 2022-04-22 NOTE — Assessment & Plan Note (Signed)
Follows with Dr. Delton Coombes.  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  Currently under surveillance.

## 2022-04-22 NOTE — Assessment & Plan Note (Signed)
Bilateral lower extremity cellulitis with erythema, and tenderness, both worse on the left.  Presented with severe sepsis.  Going by epic photos, cellulitis today appears worse than when he was admitted 11/2020 for cellulitis.  -Left lower extremity venous Dopplers -IV vancomycin and ceftriaxone as patient is diabetic and they are open wounds

## 2022-04-22 NOTE — Progress Notes (Signed)
Pharmacy Antibiotic Note  Shane Chambers is a 60 y.o. male admitted on 04/22/2022 with sepsis possibly from BLE cellulitis .  Pharmacy has been consulted for vancomycin dosing. Patient is also on ceftriaxone.   Labs pending. Last Scr 1.41 in March, estClCr ~70 ml/min. Give Vancomycin load 2000 mg x1.   At 21:10 Labs resulted with ClCr ~ 19m/hr   6/13 Vancomycin 1000 mg Q 24 hr Scr used: 2.45 mg/dL Weight: 125.6 kg Vd coeff: 0.5 L/kg Est AUC: 508   Plan: Vanc '2000mg'$  x1 then '1000mg'$  q24 hr  Monitor cultures, clinical status, renal function, vancomycin level Narrow abx as able and f/u duration    Height: '5\' 9"'$  (175.3 cm) Weight: 125.6 kg (277 lb) IBW/kg (Calculated) : 70.7  Temp (24hrs), Avg:98.3 F (36.8 C), Min:98.3 F (36.8 C), Max:98.3 F (36.8 C)  Recent Labs  Lab 04/22/22 1637 04/22/22 1953  WBC 17.9* 13.9*  LATICACIDVEN 4.1*  --     CrCl cannot be calculated (Patient's most recent lab result is older than the maximum 21 days allowed.).    Allergies  Allergen Reactions   Ace Inhibitors Swelling and Cough    [facility]   Other Itching    [name]   Penicillins     Has patient had a PCN reaction causing immediate rash, facial/tongue/throat swelling, SOB or lightheadedness with hypotension: Unknown Has patient had a PCN reaction causing severe rash involving mucus membranes or skin necrosis: Unknown Has patient had a PCN reaction that required hospitalization: Unknown Has patient had a PCN reaction occurring within the last 10 years: Unknown If all of the above answers are "NO", then may proceed with Cephalosporin use.     Antimicrobials this admission: Vanc 6/13 >>  CTX 6/13 >>   Dose adjustments this admission: N/a  Microbiology results: 6/13 BCx: pend  Thank you for allowing pharmacy to be a part of this patient's care.  LBenetta Spar PharmD, BCPS, BCCP Clinical Pharmacist  Please check AMION for  all [place]phone numbers After 10:00 PM, call [phone]

## 2022-04-22 NOTE — ED Notes (Signed)
Date and time results received: 04/22/22 6:03 PM  Test: Lactic Acid  Critical Value: 4.1  Name of Provider Notified: Dr. Langston Masker  Orders Received? Or Actions Taken?: See orders

## 2022-04-22 NOTE — Assessment & Plan Note (Signed)
-   HgbA1c - SSI- M -Resume home NovoLog at reduced dose 8 units twice daily in a.m. (home dose 15 units twice daily)

## 2022-04-23 ENCOUNTER — Ambulatory Visit (HOSPITAL_COMMUNITY)
Admission: RE | Admit: 2022-04-23 | Discharge: 2022-04-23 | Disposition: A | Discharge status: Home / Self Care | Payer: Medicare Other | Source: Ambulatory Visit | Attending: Hematology | Admitting: Hematology

## 2022-04-23 ENCOUNTER — Inpatient Hospital Stay (HOSPITAL_COMMUNITY): Payer: Medicare Other

## 2022-04-23 ENCOUNTER — Encounter (HOSPITAL_COMMUNITY): Payer: Self-pay | Admitting: Internal Medicine

## 2022-04-23 DIAGNOSIS — Z951 Presence of aortocoronary bypass graft: Secondary | ICD-10-CM

## 2022-04-23 DIAGNOSIS — R778 Other specified abnormalities of plasma proteins: Secondary | ICD-10-CM | POA: Diagnosis not present

## 2022-04-23 DIAGNOSIS — A419 Sepsis, unspecified organism: Secondary | ICD-10-CM | POA: Diagnosis not present

## 2022-04-23 DIAGNOSIS — D638 Anemia in other chronic diseases classified elsewhere: Secondary | ICD-10-CM

## 2022-04-23 DIAGNOSIS — N179 Acute kidney failure, unspecified: Secondary | ICD-10-CM | POA: Diagnosis not present

## 2022-04-23 DIAGNOSIS — L03116 Cellulitis of left lower limb: Secondary | ICD-10-CM | POA: Diagnosis not present

## 2022-04-23 DIAGNOSIS — C20 Malignant neoplasm of rectum: Secondary | ICD-10-CM

## 2022-04-23 DIAGNOSIS — E872 Acidosis, unspecified: Secondary | ICD-10-CM

## 2022-04-23 LAB — BASIC METABOLIC PANEL
Anion gap: 11 (ref 5–15)
BUN: 63 mg/dL — ABNORMAL HIGH (ref 6–20)
CO2: 21 mmol/L — ABNORMAL LOW (ref 22–32)
Calcium: 7.7 mg/dL — ABNORMAL LOW (ref 8.9–10.3)
Chloride: 103 mmol/L (ref 98–111)
Creatinine, Ser: 2.46 mg/dL — ABNORMAL HIGH (ref 0.61–1.24)
GFR, Estimated: 29 mL/min — ABNORMAL LOW (ref >60)
Glucose, Bld: 112 mg/dL — ABNORMAL HIGH (ref 70–99)
Potassium: 4.8 mmol/L (ref 3.5–5.1)
Sodium: 135 mmol/L (ref 135–145)

## 2022-04-23 LAB — MRSA NEXT GEN BY PCR, NASAL: MRSA by PCR Next Gen: DETECTED — AB

## 2022-04-23 LAB — CBC WITH DIFFERENTIAL/PLATELET
HCT: 31.9 % — ABNORMAL LOW (ref 39.0–52.0)
Hemoglobin: 10.4 g/dL — ABNORMAL LOW (ref 13.0–17.0)
MCH: 29.9 pg (ref 26.0–34.0)
MCHC: 32.6 g/dL (ref 30.0–36.0)
MCV: 91.7 fL (ref 80.0–100.0)
Platelets: 257 10*3/uL (ref 150–400)
RBC: 3.48 MIL/uL — ABNORMAL LOW (ref 4.22–5.81)
RDW: 13.2 % (ref 11.5–15.5)
WBC: 17.9 10*3/uL — ABNORMAL HIGH (ref 4.0–10.5)
nRBC: 0 % (ref 0.0–0.2)

## 2022-04-23 LAB — GLUCOSE, CAPILLARY
Glucose-Capillary: 99 mg/dL (ref 70–99)
Glucose-Capillary: 114 mg/dL — ABNORMAL HIGH (ref 70–99)
Glucose-Capillary: 166 mg/dL — ABNORMAL HIGH (ref 70–99)
Glucose-Capillary: 133 mg/dL — ABNORMAL HIGH (ref 70–99)

## 2022-04-23 LAB — CBC
HCT: 31.3 % — ABNORMAL LOW (ref 39.0–52.0)
Hemoglobin: 9.8 g/dL — ABNORMAL LOW (ref 13.0–17.0)
MCH: 29.3 pg (ref 26.0–34.0)
MCHC: 31.3 g/dL (ref 30.0–36.0)
MCV: 93.7 fL (ref 80.0–100.0)
Platelets: 210 10*3/uL (ref 150–400)
RBC: 3.34 MIL/uL — ABNORMAL LOW (ref 4.22–5.81)
RDW: 13.1 % (ref 11.5–15.5)
WBC: 14.5 10*3/uL — ABNORMAL HIGH (ref 4.0–10.5)
nRBC: 0 % (ref 0.0–0.2)

## 2022-04-23 LAB — CBG MONITORING, ED: Glucose-Capillary: 126 mg/dL — ABNORMAL HIGH (ref 70–99)

## 2022-04-23 LAB — HIV ANTIBODY (ROUTINE TESTING W REFLEX): HIV Screen 4th Generation wRfx: NONREACTIVE

## 2022-04-23 IMAGING — DX DG HIP (WITH OR WITHOUT PELVIS) 5+V BILAT
5 series · 5 of 5 positions shown · non-contrast
Comparison: None Available.

CLINICAL DATA: Bilateral hip pain.

EXAM:
DG HIP (WITH OR WITHOUT PELVIS) 5+V BILAT

[pelvis ap]
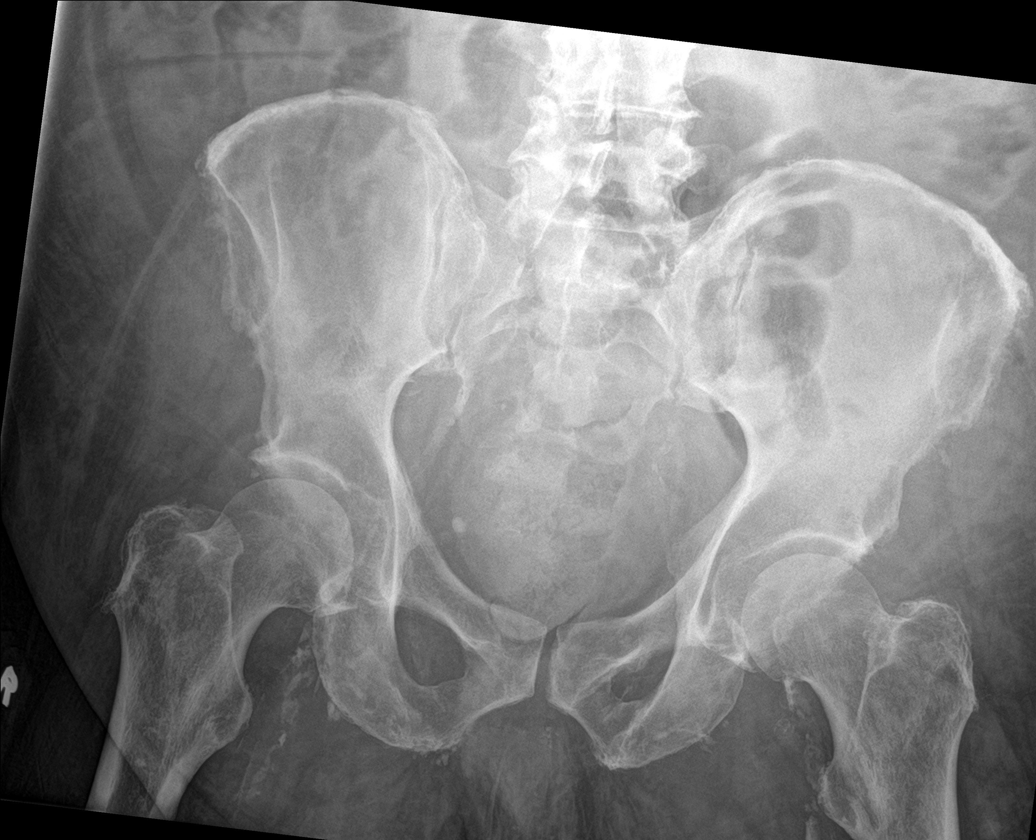

[hip ap (1 of 2)]
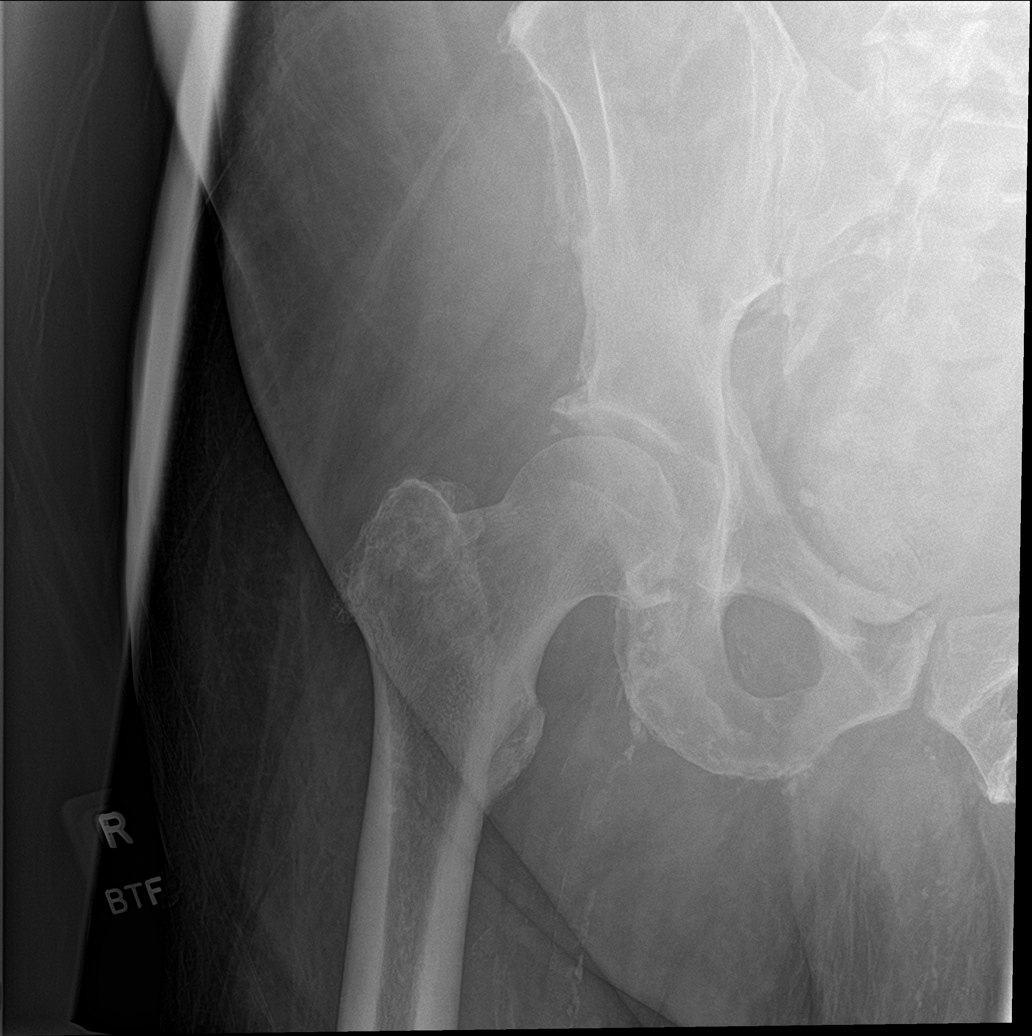

[hip lat (1 of 2)]
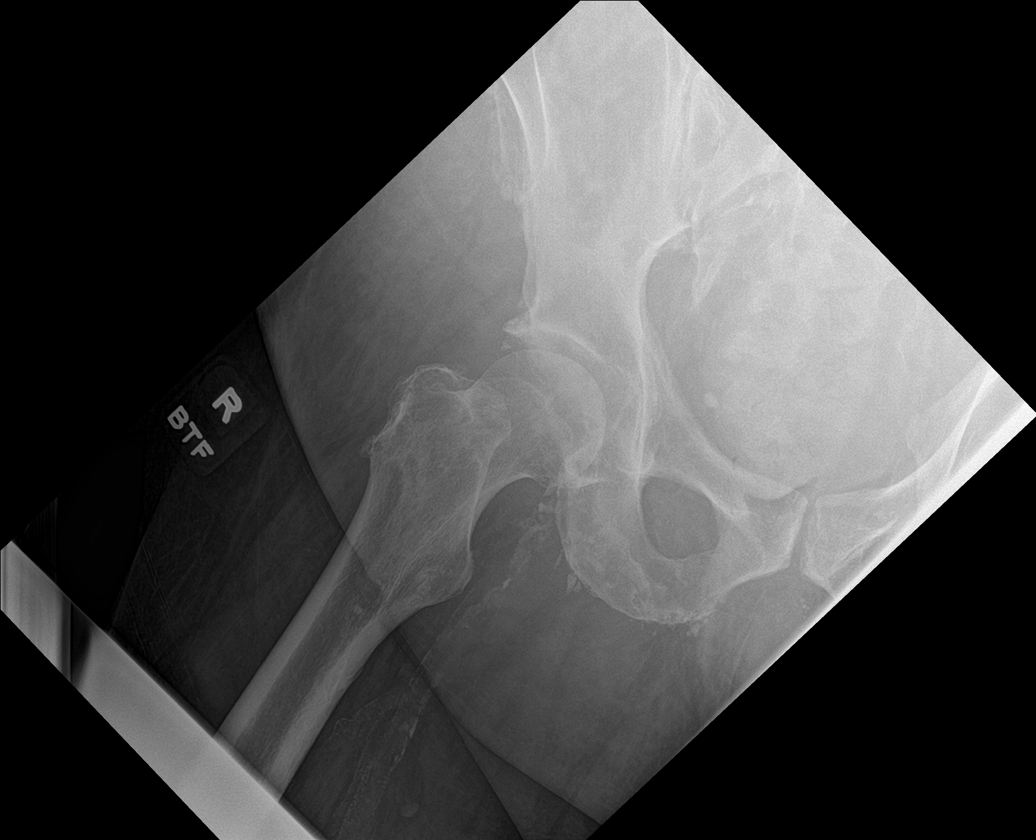

[hip ap (2 of 2)]
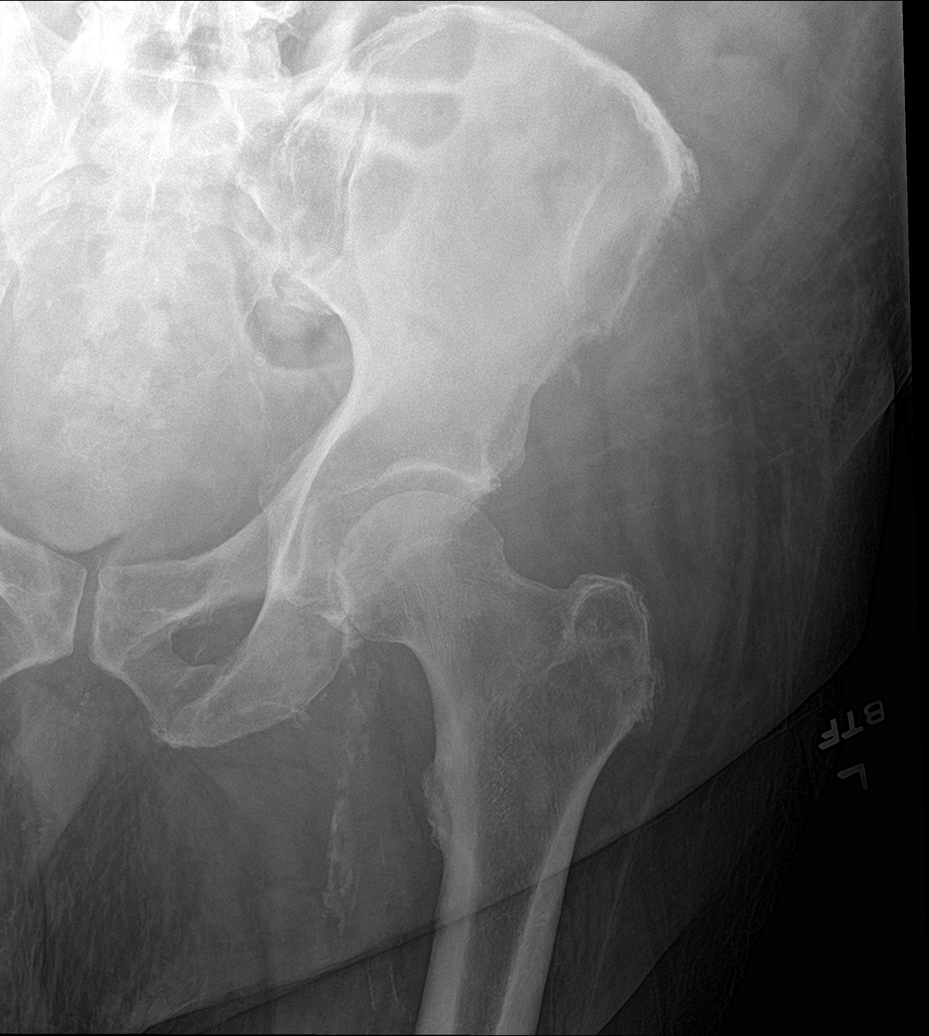

[hip lat (2 of 2)]
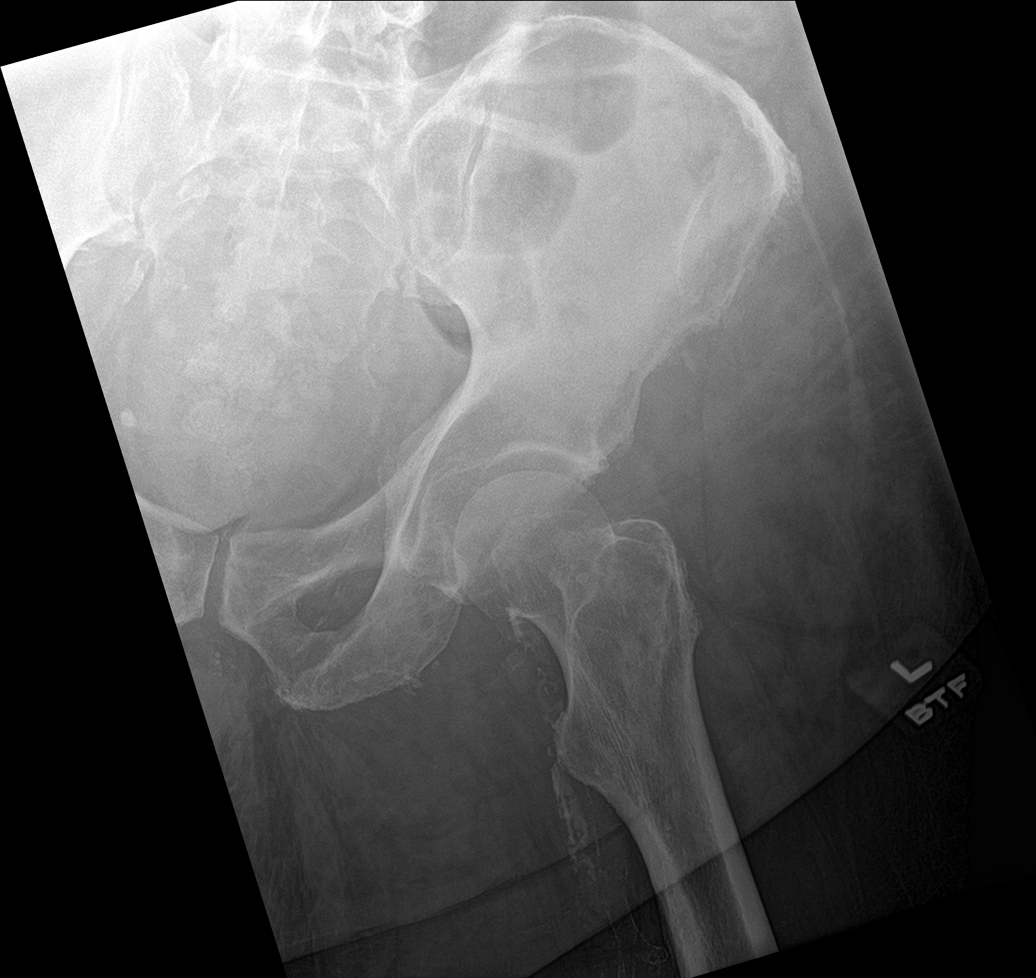

[5 of 5 positions shown; findings below may reference images not displayed]

FINDINGS: Both hip joint spaces are preserved. There is bilateral acetabular
spurring. Femoral heads are well seated. No fracture, erosion, or
bony destruction. Intact pubic rami. Pubic symphysis and sacroiliac
joints are congruent. Prominent vascular calcifications.
IMPRESSION: Mild degenerative acetabular spurring of both hips.

## 2022-04-23 IMAGING — US US EXTREM LOW VENOUS*L*
1 series · 13 of 24 positions shown · non-contrast
Comparison: Venous ultrasound [DATE]

CLINICAL DATA: 61-year-old with left lower extremity swelling.



[Series 1: us venous img lower uni left (dvt) · portal-venous · 13 of 39 slices shown]
[im 1/39]
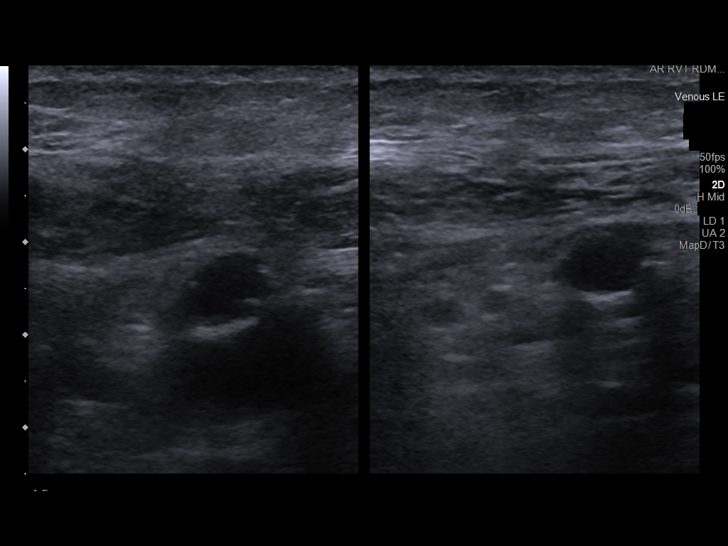
[im 4/39]
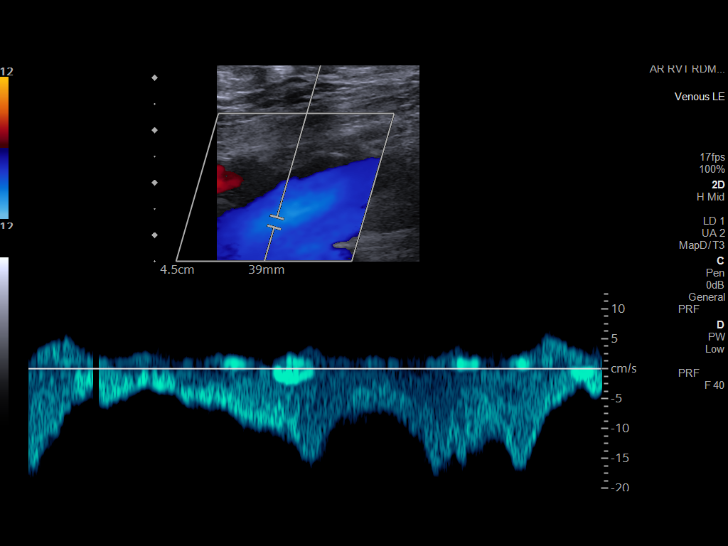
[im 7/39]
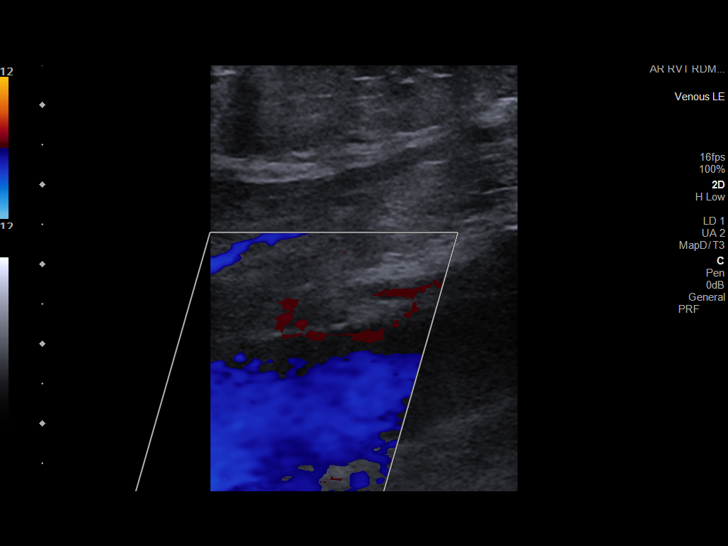
[im 10/39]
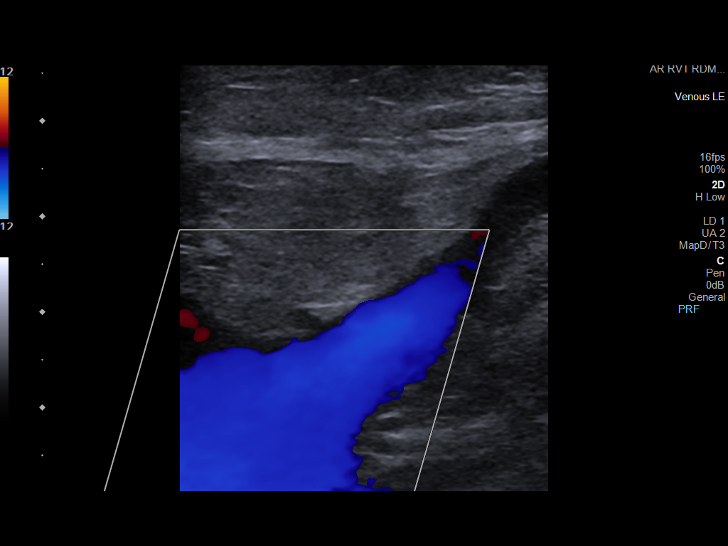
[im 14/39]
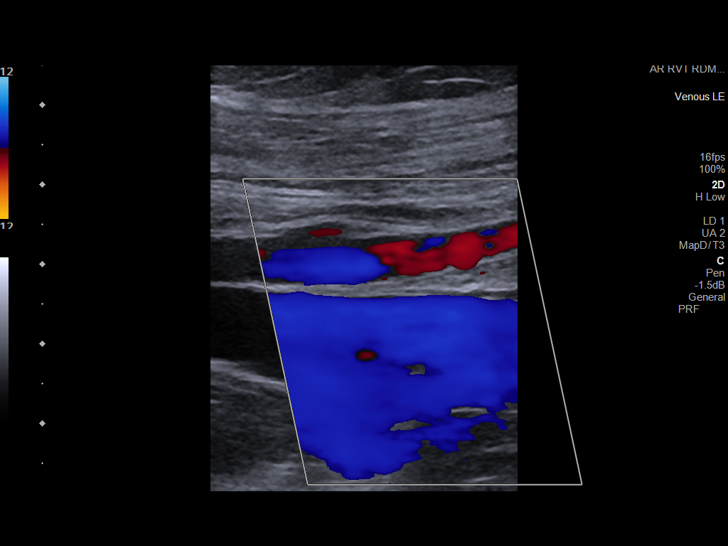
[im 17/39]
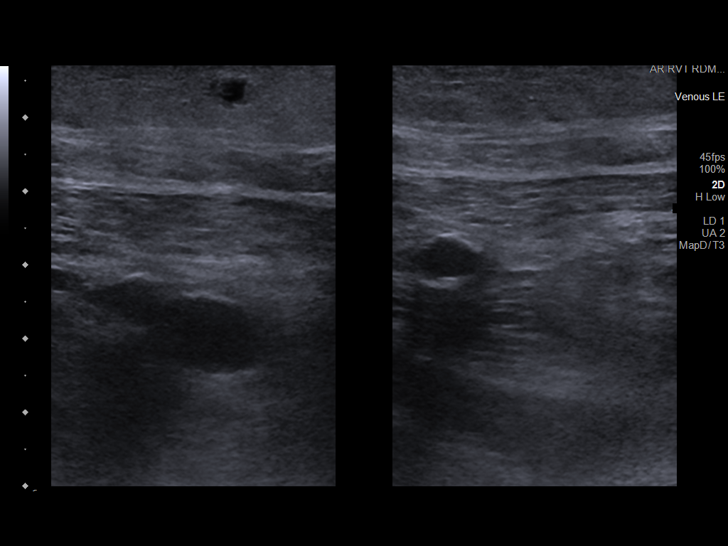
[im 20/39]
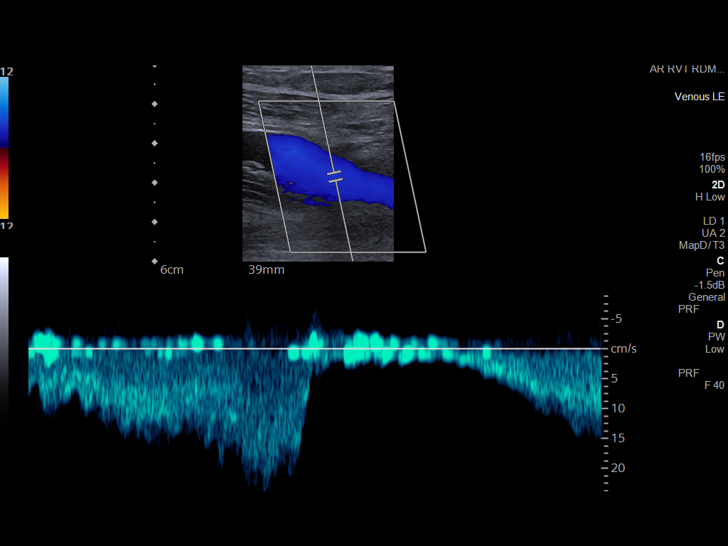
[im 22/39]
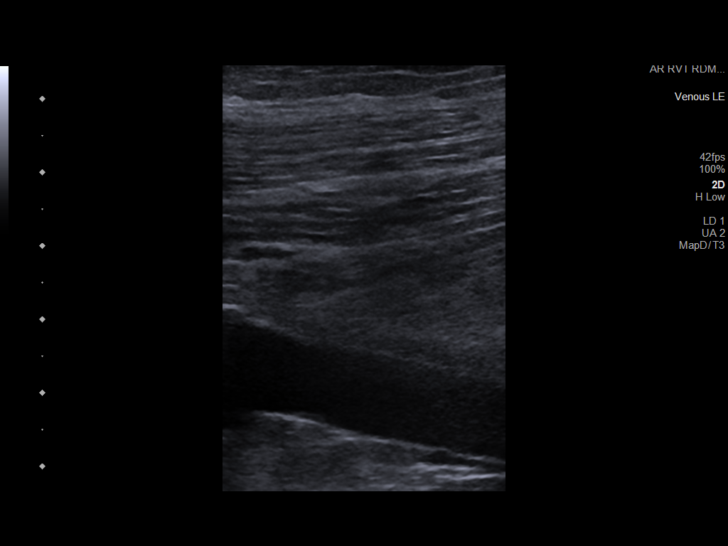
[im 25/39]
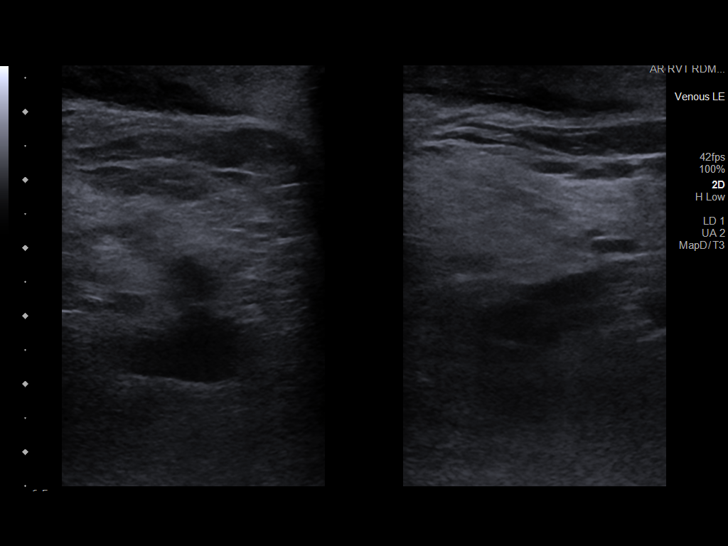
[im 29/39]
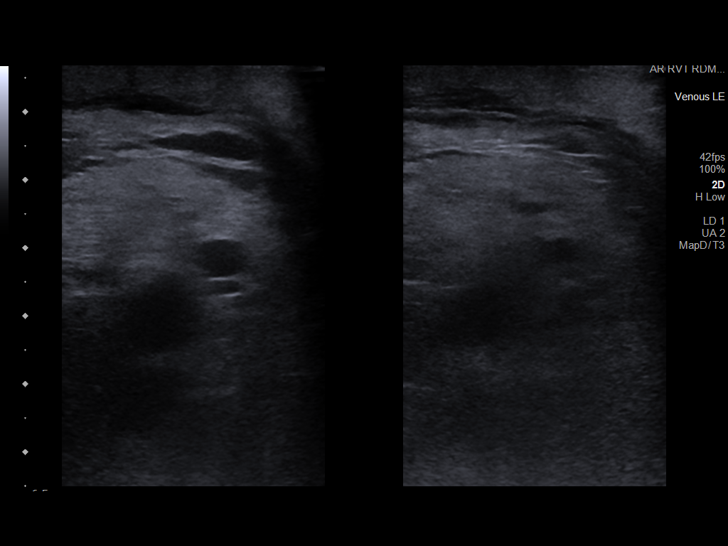
[im 32/39]
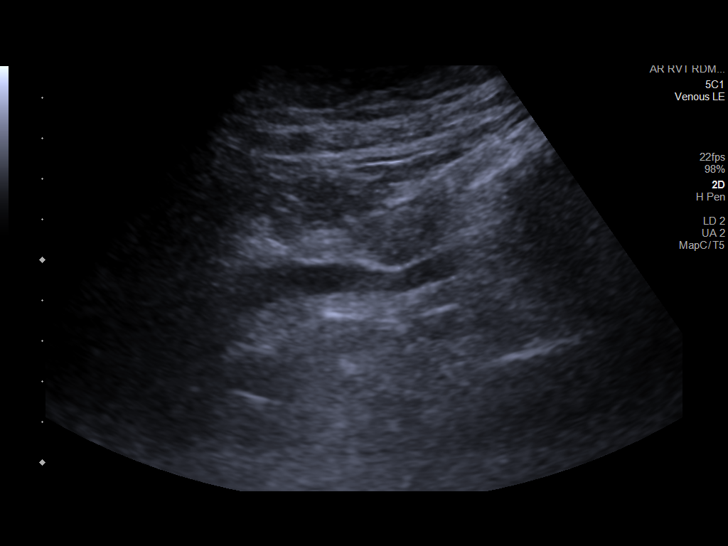
[im 35/39]
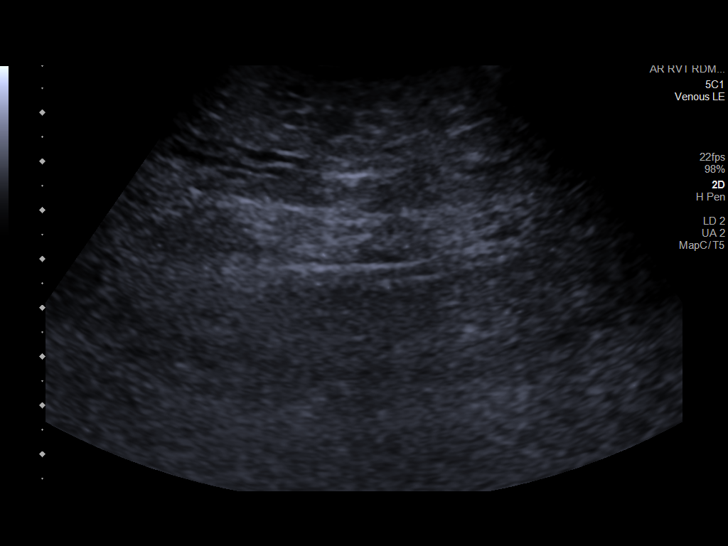
[im 39/39]
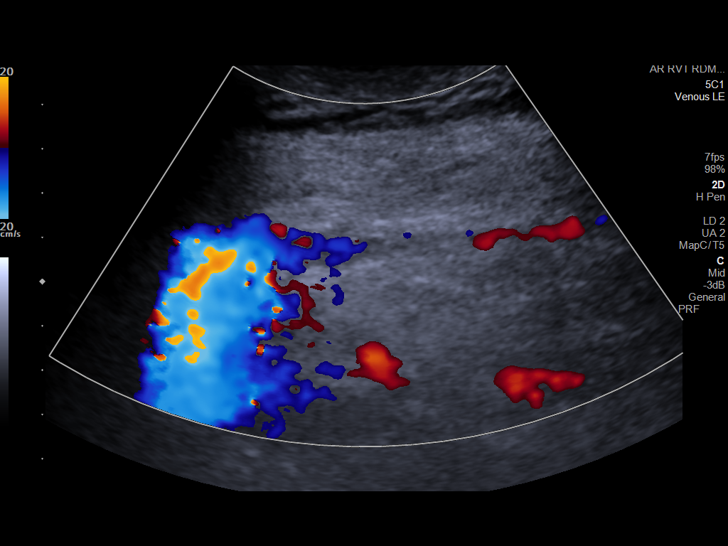

[13 of 24 positions shown; findings below may reference images not displayed]

FINDINGS: Contralateral Common Femoral Vein: Respiratory phasicity is normal
and symmetric with the symptomatic side. No evidence of thrombus.
Normal compressibility.

Common Femoral Vein: No evidence of thrombus. Normal
compressibility, respiratory phasicity and response to augmentation.

Saphenofemoral Junction: No evidence of thrombus. Normal
compressibility and flow on color Doppler imaging.

Profunda Femoral Vein: No evidence of thrombus. Normal
compressibility and flow on color Doppler imaging.

Femoral Vein: No evidence of thrombus. Normal compressibility,
respiratory phasicity and response to augmentation.

Popliteal Vein: No evidence of thrombus. Normal compressibility,
respiratory phasicity and response to augmentation.

Calf Veins: Visualized left deep calf veins are patent without
thrombus.

Other Findings:  None.
IMPRESSION: Negative for deep venous thrombosis in left lower extremity.

## 2022-04-23 MED ORDER — MUPIROCIN 2 % EX OINT
TOPICAL_OINTMENT | Freq: Two times a day (BID) | CUTANEOUS | Status: DC
  Administered 2022-04-23 – 2022-04-27 (×2): 1 via NASAL
  Filled 2022-04-23 (×2): qty 22

## 2022-04-23 MED ORDER — CHLORHEXIDINE GLUCONATE CLOTH 2 % EX PADS
6.0000 | MEDICATED_PAD | Freq: Every day | CUTANEOUS | Status: DC
  Administered 2022-04-23: 6 via TOPICAL

## 2022-04-23 MED ORDER — SODIUM CHLORIDE 0.9 % IV SOLN: INTRAVENOUS | Status: DC

## 2022-04-23 NOTE — Progress Notes (Signed)
PROGRESS NOTE    Shane Chambers  YBO:[id number] DOB: August 29, 1961 DOA: 04/22/2022 PCP: Caprice Renshaw, MD   Brief Narrative:  61 y.o. male with medical history significant for diabetes mellitus, chronic systolic and diastolic CHF, depression, CABG x5, COPD, OSA, rectal cancer presented with bilateral lower extremity pain, swelling and redness.  On presentation, he was hypotensive with systolic blood pressure in the 80s with WBC of 13.9, lactic acid of 4.1 and subsequently 2.6; high-sensitivity troponin 110 and 116 subsequently and creatinine elevated at 2.45.  UA was not suggestive of UTI.  Chest x-ray showed no acute abnormality.  He was started on IV fluids and antibiotics.  Assessment & Plan:   Severe sepsis: Present on admission Bilateral lower leg cellulitis Lactic acidosis Leukocytosis -Presented with hypotension, leukocytosis, lactic acidosis, acute kidney injury secondary to possible bilateral lower extremity cellulitis.  UA and chest x-ray not suggestive of infection. -Blood pressure improving.  Follow cultures.  Continue broad-spectrum antibiotics.  Continue IV fluids. -Left lower extremity venous Doppler pending. -Repeat a.m. labs including WBCs  Acute kidney injury -Recent baseline of 1.3-1.6.  Presented with creatinine of 2.45.  Creatinine 2.46 today.  Continue IV fluids.  Repeat a.m. labs.  Losartan and torsemide on hold.  Elevated troponin History of CAD/CABG -High-sensitivity troponins 110 and 116 subsequently on presentation.  Possibly from demand ischemia from sepsis and hypotension.  No chest pain; EKG without any acute abnormalities.  No further work-up needed -Continue aspirin.  Beta-blocker on hold because of hypotension  Chronic systolic and diastolic CHF -Compensated.  Strict input and output.  Daily weights.  Last echo in 2021 had shown EF of 55 to 60% with grade 2 diastolic dysfunction. -Outpatient follow-up with PCP/cardiology.  Hold losartan and  torsemide  Diabetes mellitus type 2 -Blood sugars on the lower end.  Continue low-dose of NPH  Hypertension -Patient was hypotensive on presentation.  Blood pressure improving.  Torsemide, metoprolol and losartan on hold.  BPH -Continue tamsulosin -Patient needed straight in and out cath this morning for retention.  Continue the same if patient has recurrence of retention.  Outpatient follow-up with urology  Depression -Continue amitriptyline and venlafaxine  Possible anemia of chronic disease -From chronic illnesses.  Hemoglobin currently stable  Hyperkalemia -Resolved  Rectal cancer -Follows with Dr. Delton Coombes.  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  Currently under surveillance.  Outpatient follow-up with oncology.  Morbid obesity -Outpatient follow-up  DVT prophylaxis: Heparin Code Status: Full Family Communication: None at bedside Disposition Plan: Status is: Inpatient Remains inpatient appropriate because: Of severity of illness.  Need for IV fluids and antibiotics.   Consultants: None  Procedures: None  Antimicrobials: Rocephin and vancomycin from 04/22/2022 onwards   Subjective: Patient seen and examined at bedside.  Slow to respond, poor historian.  Does not know if he feels better.  Nursing staff reports an episode of urinary retention requiring straight in and out cath this morning.  No overnight fever, vomiting reported.  Objective: Vitals:   04/23/22 0312 04/23/22 0738 04/23/22 0755 04/23/22 0835  BP: (!) 126/107 118/66    Pulse: 89 87    Resp: (!) 22 19    Temp: 98.1 F (36.7 C)  97.8 F (36.6 C)   TempSrc: Oral  Oral   SpO2: 95% 96%  97%  Weight: 126.4 kg     Height: '5\' 9"'$  (1.753 m)       Intake/Output Summary (Last 24 hours) at 04/23/2022 0955 Last data filed at 04/23/2022 [phone]  Gross per 24 hour  Intake 3430.93 ml  Output 1175 ml  Net 2255.93 ml   Filed Weights   04/22/22 1558 04/23/22 0312  Weight: 125.6 kg  126.4 kg    Examination:  General exam: Appears calm and comfortable.  Looks chronically ill and deconditioned.  Currently on room air. Respiratory system: Bilateral decreased breath sounds at bases Cardiovascular system: S1 & S2 heard, Rate controlled Gastrointestinal system: Abdomen is morbidly obese, nondistended, soft and nontender. Normal bowel sounds heard. Extremities: No cyanosis, clubbing; bilateral lower extremity edema present Central nervous system: Awake, slow to respond, poor historian. No focal neurological deficits. Moving extremities Skin: Bilateral lower extremity erythematous, edematous, tender from mid shin to above ankle with open wounds to bilateral lower extremities with not much discharge currently  psychiatry: Affect is flat.  No signs of agitation currently.    Data Reviewed: I have personally reviewed following labs and imaging studies  CBC: Recent Labs  Lab 04/22/22 1637 04/22/22 1953 04/23/22 0403  WBC 17.9* 13.9* 14.5*  NEUTROABS  --  10.7*  --   HGB 10.4* 9.6* 9.8*  HCT 31.9* 30.1* 31.3*  MCV 91.7 92.9 93.7  PLT 257 216 301   Basic Metabolic Panel: Recent Labs  Lab 04/22/22 1953 04/23/22 0403  NA 136 135  K 5.2* 4.8  CL 102 103  CO2 26 21*  GLUCOSE 120* 112*  BUN 58* 63*  CREATININE 2.45* 2.46*  CALCIUM 7.4* 7.7*   GFR: Estimated Creatinine Clearance: 42.5 mL/min (A) (by C-G formula based on SCr of 2.46 mg/dL (H)). Liver Function Tests: Recent Labs  Lab 04/22/22 1953  AST 64*  ALT 24  ALKPHOS 71  BILITOT 1.1  PROT 5.8*  ALBUMIN 2.6*   No results for input(s): "LIPASE", "AMYLASE" in the last 168 hours. No results for input(s): "AMMONIA" in the last 168 hours. Coagulation Profile: Recent Labs  Lab 04/22/22 1953  INR 1.6*   Cardiac Enzymes: No results for input(s): "CKTOTAL", "CKMB", "CKMBINDEX", "TROPONINI" in the last 168 hours. BNP (last 3 results) No results for input(s): "PROBNP" in the last 8760 hours. HbA1C: No  results for input(s): "HGBA1C" in the last 72 hours. CBG: Recent Labs  Lab 04/23/22 0008 04/23/22 0752  GLUCAP 126* 99   Lipid Profile: No results for input(s): "CHOL", "HDL", "LDLCALC", "TRIG", "CHOLHDL", "LDLDIRECT" in the last 72 hours. Thyroid Function Tests: No results for input(s): "TSH", "T4TOTAL", "FREET4", "T3FREE", "THYROIDAB" in the last 72 hours. Anemia Panel: No results for input(s): "VITAMINB12", "FOLATE", "FERRITIN", "TIBC", "IRON", "RETICCTPCT" in the last 72 hours. Sepsis Labs: Recent Labs  Lab 04/22/22 1637 04/22/22 1953  LATICACIDVEN 4.1* 2.6*    Recent Results (from the past 240 hour(s))  Culture, blood (Routine x 2)     Status: None (Preliminary result)   Collection Time: 04/22/22  4:37 PM   Specimen: Right Antecubital; Blood  Result Value Ref Range Status   Specimen Description RIGHT ANTECUBITAL  Final   Special Requests   Final    BOTTLES DRAWN AEROBIC AND ANAEROBIC Blood Culture results may not be optimal due to an inadequate volume of blood received in culture bottles   Culture   Final    NO GROWTH < 24 HOURS Performed at [facility], [address] [place],  [address]    Report Status PENDING  Incomplete  Culture, blood (Routine x 2)     Status: None (Preliminary result)   Collection Time: 04/22/22  4:37 PM   Specimen: Left Antecubital; Blood  Result Value Ref Range Status   Specimen Description LEFT ANTECUBITAL  Final   Special Requests   Final    BOTTLES DRAWN AEROBIC AND ANAEROBIC Blood Culture adequate volume   Culture   Final    NO GROWTH < 24 HOURS Performed at [facility], [address], [place], [place] [address]    Report Status PENDING  Incomplete         Radiology Studies: DG Chest Port 1 View  Result Date: 04/22/2022 CLINICAL DATA:  Sepsis. EXAM: PORTABLE CHEST 1 VIEW COMPARISON:  Chest x-ray 11/22/2020 FINDINGS: Median sternotomy wires are present. Lung volumes are low. The heart size and mediastinal contours are  within normal limits. Both lungs are clear. The visualized skeletal structures are unremarkable. IMPRESSION: No active disease. Electronically Signed   By: Ronney Asters M.D.   On: 04/22/2022 17:14        Scheduled Meds:  amitriptyline  25 mg Oral QHS   aspirin EC  81 mg Oral Q breakfast   Chlorhexidine Gluconate Cloth  6 each Topical Daily   heparin  5,000 Units Subcutaneous Q8H   insulin aspart  0-15 Units Subcutaneous TID WC   insulin aspart  0-5 Units Subcutaneous QHS   insulin aspart protamine- aspart  8 Units Subcutaneous BID WC   levothyroxine  75 mcg Oral Q0600   mometasone-formoterol  2 puff Inhalation BID   pantoprazole  40 mg Oral Daily   tamsulosin  0.8 mg Oral Daily   venlafaxine XR  225 mg Oral Daily   Continuous Infusions:  sodium chloride 100 mL/hr at 04/23/22 0831   cefTRIAXone (ROCEPHIN)  IV     vancomycin            Aline August, MD [facility] 04/23/2022, 9:55 AM

## 2022-04-23 NOTE — TOC Initial Note (Signed)
Transition of Care [facility]) - Initial/Assessment Note    Patient Details  Name: Shane Chambers MRN: [id number] Date of Birth: 26-Aug-1961  [facility]) CM/SW Contact:    Iona Beard, [place] Phone Number: 04/23/2022, 10:22 AM  Clinical Narrative:                 TOC noted that pt arrived to ED from [facility] and rehab. CSW spoke to [facility] in admissions who states pt is a resident at their facility. Plan for return to facility once medically stable. CSW started Fl2. TOC to follow.   Expected Discharge Plan: Long Term Nursing Home Barriers to Discharge: Continued Medical Work up   Patient Goals and CMS Choice Patient states their goals for this hospitalization and ongoing recovery are:: return to facility CMS Medicare.gov Compare Post Acute Care list provided to:: Patient Choice offered to / list presented to : Patient  Expected Discharge Plan and Services Expected Discharge Plan: [place] Acute Care Choice: Nursing Home Living arrangements for the past 2 months: [place]                                      Prior Living Arrangements/Services Living arrangements for the past 2 months: [place] Lives with:: Facility Resident Patient language and need for interpreter reviewed:: Yes Do you feel safe going back to the place where you live?: Yes      Need for Family Participation in Patient Care: Yes (Comment) Care giver support system in place?: Yes (comment)   Criminal Activity/Legal Involvement Pertinent to Current Situation/Hospitalization: No - Comment as needed  Activities of Daily Living Home Assistive Devices/Equipment: Wheelchair ADL Screening (condition at time of admission) Patient's cognitive ability adequate to safely complete daily activities?: No Is the patient deaf or have difficulty hearing?: No Does the patient have difficulty seeing, even when wearing glasses/contacts?:  No Does the patient have difficulty concentrating, remembering, or making decisions?: Yes Patient able to express need for assistance with ADLs?: Yes Does the patient have difficulty dressing or bathing?: Yes Independently performs ADLs?: No Communication: Needs assistance Is this a change from baseline?: Pre-admission baseline Dressing (OT): Needs assistance Is this a change from baseline?: Pre-admission baseline Grooming: Needs assistance Is this a change from baseline?: Pre-admission baseline Feeding: Needs assistance Is this a change from baseline?: Pre-admission baseline Bathing: Dependent Is this a change from baseline?: Pre-admission baseline Toileting: Dependent Is this a change from baseline?: Pre-admission baseline In/Out Bed: Dependent Is this a change from baseline?: Pre-admission baseline Walks in Home: Dependent (uses wheelchair) Is this a change from baseline?: Pre-admission baseline Does the patient have difficulty walking or climbing stairs?: Yes Weakness of Legs: Both Weakness of Arms/Hands: Both  Permission Sought/Granted                  Emotional Assessment Appearance:: Appears stated age     Orientation: : Oriented to Place, Oriented to Self Alcohol / Substance Use: Not Applicable Psych Involvement: No (comment)  Admission diagnosis:  Elevated troponin [R77.8] AKI (acute kidney injury) ([place]) [N17.9] Sepsis ([place]) [A41.9] Sepsis, due to unspecified organism, unspecified whether acute organ dysfunction present [facility]) [A41.9] Patient Active Problem List   Diagnosis Date Noted   Lactic acidosis 04/23/2022   Anemia of chronic disease 04/23/2022   Obesity, Class III, BMI 40-49.9 (morbid obesity) ([place]) 04/23/2022  AKI (acute kidney injury) ([place]) 04/22/2022   Elevated troponin 04/22/2022   Cancer of sigmoid colon ([place]) 12/16/2021   Constipation 12/16/2021   GERD (gastroesophageal reflux disease) 12/16/2021   Iron deficiency anemia 09/17/2021    Rectal cancer ([place]) 09/03/2021   Bilateral lower leg cellulitis 79/89/2119   Systolic and diastolic CHF, chronic ([place]) 11/22/2020   CHF exacerbation ([place]) 08/01/2020   Visit for wound check 05/28/2020   OSA (obstructive sleep apnea) 05/24/2020   Acute exacerbation of chronic obstructive pulmonary disease (COPD) ([place]) 04/29/2020   Leukocytosis 04/29/2020   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   Ischemic cardiomyopathy    Coronary artery disease    Acute on chronic combined systolic and diastolic CHF (congestive heart failure) ([place]) 03/05/2020   Acute hyponatremia 03/05/2020   Severe Vitamin D deficiency 12/02/2019   Hypokalemia 12/01/2019   Hyperlipidemia 12/01/2019   BPH (benign prostatic hyperplasia) 12/01/2019   Normocytic anemia 12/01/2019   Pneumonia due to COVID-19 virus 12/01/2019   Hypoxia    SOB (shortness of breath) 11/16/2019   Severe sepsis ([place]) 11/16/2019   Hypertension    Type 2 diabetes mellitus ([place])    Depression    Acute bronchitis with bronchospasm 11/15/2019   PCP:  Caprice Renshaw, MD Pharmacy:   [facility] [address] [place] [address] Phone: [phone] Fax: [phone]     Social Determinants of Health (SDOH) Interventions    Readmission Risk Interventions    04/23/2022   10:20 AM 11/25/2020   10:15 AM 11/23/2020    3:04 PM  Readmission Risk Prevention Plan  Transportation Screening Complete Complete Complete  PCP or Specialist Appt within 3-5 Days Complete    HRI or [place] Complete    Social Work Consult for [place] Planning/Counseling Complete    Palliative Care Screening Not Applicable    Medication Review Press photographer) Complete Complete   HRI or Home Care Consult  Complete   SW Recovery Care/Counseling Consult  Complete Complete  Palliative Care Screening  Not Applicable Not Applicable  Skilled Nursing Facility  Complete Patient Refused

## 2022-04-23 NOTE — Plan of Care (Signed)
  Problem: Acute Rehab PT Goals(only PT should resolve) Goal: Pt Will Go Supine/Side To Sit Outcome: Progressing Flowsheets (Taken 04/23/2022 1538) Pt will go Supine/Side to Sit:  with min guard assist  with minimal assist Goal: Patient Will Transfer Sit To/From Stand Outcome: Progressing Flowsheets (Taken 04/23/2022 1538) Patient will transfer sit to/from stand:  with min guard assist  with minimal assist Goal: Pt Will Transfer Bed To Chair/Chair To Bed Outcome: Progressing Flowsheets (Taken 04/23/2022 1538) Pt will Transfer Bed to Chair/Chair to Bed: with min assist Goal: Pt Will Ambulate Outcome: Progressing Flowsheets (Taken 04/23/2022 1538) Pt will Ambulate:  15 feet  with minimal assist  with rolling walker   3:39 PM, 04/23/22 Lonell Grandchild, MPT Physical Therapist with [facility] [phone] office [phone] mobile phone

## 2022-04-23 NOTE — Progress Notes (Signed)
   04/23/22 1715  Assess: MEWS Score  Temp 98.4 F (36.9 C)  BP 138/77  MAP (mmHg) 94  Pulse Rate (!) 109  Resp 18  SpO2 100 %  Assess: MEWS Score  MEWS Temp 0  MEWS Systolic 0  MEWS Pulse 1  MEWS RR 0  MEWS LOC 1  MEWS Score 2  MEWS Score Color Yellow  Assess: if the MEWS score is Yellow or Red  Were vital signs taken at a resting state? Yes  Focused Assessment Change from prior assessment (see assessment flowsheet)  Treat  Pain Scale 0-10  Pain Score 8  Pain Type Acute pain  Pain Location Hip  Pain Intervention(s)  (Patient given medication earlier and patient just ambulated from wheelchair to bed.)  Take Vital Signs  Increase Vital Sign Frequency  Yellow: Q 2hr X 2 then Q 4hr X 2, if remains yellow, continue Q 4hrs  Escalate  MEWS: Escalate Yellow: discuss with charge nurse/RN and consider discussing with provider and RRT  Notify: Charge Nurse/RN  Name of Charge Nurse/RN Notified [facility] RN  Date Charge Nurse/RN Notified 04/23/22  Time Charge Nurse/RN Notified 1722  Notify: Provider  Provider Name/Title Aline August MD  Date Provider Notified 04/23/22  Time Provider Notified 1720  Method of Notification Page (Secure chat)  Notification Reason Other (Comment) (yellow MEWS)  Provider response No new orders  Date of Provider Response 04/23/22  Time of Provider Response 1722  Assess: SIRS CRITERIA  SIRS Temperature  0  SIRS Pulse 1  SIRS Respirations  0  SIRS WBC 0  SIRS Score Sum  1

## 2022-04-23 NOTE — Evaluation (Signed)
Physical Therapy Evaluation Patient Details Name: Shane Chambers MRN: [id number] DOB: 1961-07-30 Today's Date: 04/23/2022  History of Present Illness  Shane Chambers is a 61 y.o. male with medical history significant for diabetes mellitus, systolic and diastolic CHF, depression, CABG x5, COPD OSA, rectal cancer.  Patient was brought to the ED reports of bilateral lower extremity pain swelling and redness.  Patient tells me he has had chronic pain in his lower extremities but the pain was worse yesterday.  Also reports feeling ill.  Denies vomiting, no loose stools no abdominal pain, denies chest pain, no cough no difficulty breathing.  No neck pain or stiffness, denies urinary symptoms.  Reports pain in his buttock area, but he denies any ulcers involving his back or buttock area.   Clinical Impression  Patient demonstrates slow labored movement for sitting up at bedside requiring Mod assist, use of bed rail and HOB raised, once seated had difficulty flexing knees due to BLE swelling, left worse than right, limited to a few slow labored side steps before having to sit mostly due to left knee pain and generalized weakness.  Patient tolerated sitting up in chair after therapy - RN aware.  Patient will benefit from continued skilled physical therapy in hospital and recommended venue below to increase strength, balance, endurance for safe ADLs and gait.       Recommendations for follow up therapy are one component of a multi-disciplinary discharge planning process, led by the attending physician.  Recommendations may be updated based on patient status, additional functional criteria and insurance authorization.  Follow Up Recommendations Skilled nursing-short term rehab (<3 hours/day)    Assistance Recommended at Discharge Set up Supervision/Assistance  Patient can return home with the following  A lot of help with walking and/or transfers;A little help with bathing/dressing/bathroom;Help with  stairs or ramp for entrance;Assistance with cooking/housework    Equipment Recommendations None recommended by PT  Recommendations for Other Services       Functional Status Assessment Patient has had a recent decline in their functional status and demonstrates the ability to make significant improvements in function in a reasonable and predictable amount of time.     Precautions / Restrictions Precautions Precautions: Fall Restrictions Weight Bearing Restrictions: No      Mobility  Bed Mobility Overal bed mobility: Needs Assistance Bed Mobility: Supine to Sit     Supine to sit: Mod assist, HOB elevated     General bed mobility comments: increased time, labored movement requiring use of bed rail    Transfers Overall transfer level: Needs assistance Equipment used: Rolling walker (2 wheels) Transfers: Sit to/from Stand, Bed to chair/wheelchair/BSC Sit to Stand: Min assist, Mod assist   Step pivot transfers: Min assist, Mod assist       General transfer comment: slow labored movement    Ambulation/Gait Ambulation/Gait assistance: Mod assist Gait Distance (Feet): 5 Feet Assistive device: Rolling walker (2 wheels) Gait Pattern/deviations: Decreased step length - right, Decreased step length - left, Decreased stride length Gait velocity: decreased     General Gait Details: limited to a few slow labored unsteady side steps mostly due to left knee pain and fatigue  Stairs            Wheelchair Mobility    Modified Rankin (Stroke Patients Only)       Balance Overall balance assessment: Needs assistance Sitting-balance support: Feet supported, No upper extremity supported Sitting balance-Leahy Scale: Fair Sitting balance - Comments: fair/good seated at EOB  Standing balance support: During functional activity, Bilateral upper extremity supported Standing balance-Leahy Scale: Fair Standing balance comment: using RW                              Pertinent Vitals/Pain Pain Assessment Pain Assessment: Faces Faces Pain Scale: Hurts even more Pain Location: BLE, LLE worse especially knee Pain Descriptors / Indicators: Guarding, Grimacing, Sore Pain Intervention(s): Limited activity within patient's tolerance, Monitored during session, Repositioned    Home Living Family/patient expects to be discharged to:: Skilled nursing facility                        Prior Function Prior Level of Function : Needs assist       Physical Assist : Mobility (physical);ADLs (physical)     Mobility Comments: Mod Indep bed/wc transfers, assisted short distanced household using RW ADLs Comments: assisted by SNF staff     Hand Dominance   Dominant Hand: Right    Extremity/Trunk Assessment   Upper Extremity Assessment Upper Extremity Assessment: Generalized weakness    Lower Extremity Assessment Lower Extremity Assessment: Generalized weakness    Cervical / Trunk Assessment Cervical / Trunk Assessment: Normal  Communication   Communication: No difficulties  Cognition Arousal/Alertness: Awake/alert Behavior During Therapy: WFL for tasks assessed/performed Overall Cognitive Status: Within Functional Limits for tasks assessed                                          General Comments      Exercises     Assessment/Plan    PT Assessment Patient needs continued PT services  PT Problem List Decreased strength;Decreased activity tolerance;Decreased balance;Decreased mobility       PT Treatment Interventions DME instruction;Gait training;Stair training;Functional mobility training;Therapeutic activities;Therapeutic exercise;Balance training;Patient/family education    PT Goals (Current goals can be found in the Care Plan section)  Acute Rehab PT Goals Patient Stated Goal: return home PT Goal Formulation: With patient Time For Goal Achievement: 05/07/22 Potential to Achieve Goals: Good     Frequency Min 3X/week     Co-evaluation               AM-PAC PT "6 Clicks" Mobility  Outcome Measure Help needed turning from your back to your side while in a flat bed without using bedrails?: A Little Help needed moving from lying on your back to sitting on the side of a flat bed without using bedrails?: A Lot Help needed moving to and from a bed to a chair (including a wheelchair)?: A Lot Help needed standing up from a chair using your arms (e.g., wheelchair or bedside chair)?: A Lot Help needed to walk in hospital room?: A Lot Help needed climbing 3-5 steps with a railing? : Total 6 Click Score: 12    End of Session   Activity Tolerance: Patient tolerated treatment well;Patient limited by fatigue Patient left: in chair;with call bell/phone within reach Nurse Communication: Mobility status PT Visit Diagnosis: Unsteadiness on feet (R26.81);Other abnormalities of gait and mobility (R26.89);Muscle weakness (generalized) (M62.81)    Time: 4742-5956 PT Time Calculation (min) (ACUTE ONLY): 20 min   Charges:   PT Evaluation $PT Eval Moderate Complexity: 1 Mod PT Treatments $Therapeutic Activity: 8-22 mins        3:37 PM, 04/23/22 Lonell Grandchild, MPT Physical Therapist with [facility]  [facility] [phone] office [phone] mobile phone

## 2022-04-23 NOTE — Progress Notes (Signed)
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 Or Actions Taken?:  Bactroban ointment ordered per protocol

## 2022-04-24 DIAGNOSIS — A419 Sepsis, unspecified organism: Secondary | ICD-10-CM | POA: Diagnosis not present

## 2022-04-24 DIAGNOSIS — L03116 Cellulitis of left lower limb: Secondary | ICD-10-CM | POA: Diagnosis not present

## 2022-04-24 DIAGNOSIS — N179 Acute kidney failure, unspecified: Secondary | ICD-10-CM | POA: Diagnosis not present

## 2022-04-24 DIAGNOSIS — D638 Anemia in other chronic diseases classified elsewhere: Secondary | ICD-10-CM

## 2022-04-24 DIAGNOSIS — D72829 Elevated white blood cell count, unspecified: Secondary | ICD-10-CM

## 2022-04-24 DIAGNOSIS — N400 Enlarged prostate without lower urinary tract symptoms: Secondary | ICD-10-CM

## 2022-04-24 LAB — COMPREHENSIVE METABOLIC PANEL
ALT: 25 U/L (ref 0–44)
AST: 46 U/L — ABNORMAL HIGH (ref 15–41)
Albumin: 2.7 g/dL — ABNORMAL LOW (ref 3.5–5.0)
Alkaline Phosphatase: 97 U/L (ref 38–126)
Anion gap: 8 (ref 5–15)
BUN: 54 mg/dL — ABNORMAL HIGH (ref 6–20)
CO2: 22 mmol/L (ref 22–32)
Calcium: 7.6 mg/dL — ABNORMAL LOW (ref 8.9–10.3)
Chloride: 104 mmol/L (ref 98–111)
Creatinine, Ser: 1.84 mg/dL — ABNORMAL HIGH (ref 0.61–1.24)
GFR, Estimated: 42 mL/min — ABNORMAL LOW (ref >60)
Glucose, Bld: 131 mg/dL — ABNORMAL HIGH (ref 70–99)
Potassium: 3.9 mmol/L (ref 3.5–5.1)
Sodium: 134 mmol/L — ABNORMAL LOW (ref 135–145)
Total Bilirubin: 0.7 mg/dL (ref 0.3–1.2)
Total Protein: 6.4 g/dL — ABNORMAL LOW (ref 6.5–8.1)

## 2022-04-24 LAB — CBC WITH DIFFERENTIAL/PLATELET
Abs Immature Granulocytes: 0.18 10*3/uL — ABNORMAL HIGH (ref 0.00–0.07)
Basophils Absolute: 0.1 10*3/uL (ref 0.0–0.1)
Basophils Relative: 1 %
Eosinophils Absolute: 0.2 10*3/uL (ref 0.0–0.5)
Eosinophils Relative: 1 %
HCT: 30.3 % — ABNORMAL LOW (ref 39.0–52.0)
Hemoglobin: 9.7 g/dL — ABNORMAL LOW (ref 13.0–17.0)
Immature Granulocytes: 1 %
Lymphocytes Relative: 6 %
Lymphs Abs: 1 10*3/uL (ref 0.7–4.0)
MCH: 29.8 pg (ref 26.0–34.0)
MCHC: 32 g/dL (ref 30.0–36.0)
MCV: 93.2 fL (ref 80.0–100.0)
Monocytes Absolute: 0.9 10*3/uL (ref 0.1–1.0)
Monocytes Relative: 5 %
Neutro Abs: 15.6 10*3/uL — ABNORMAL HIGH (ref 1.7–7.7)
Neutrophils Relative %: 86 %
Platelets: 231 10*3/uL (ref 150–400)
RBC: 3.25 MIL/uL — ABNORMAL LOW (ref 4.22–5.81)
RDW: 13.2 % (ref 11.5–15.5)
WBC: 18 10*3/uL — ABNORMAL HIGH (ref 4.0–10.5)
nRBC: 0 % (ref 0.0–0.2)

## 2022-04-24 LAB — GLUCOSE, CAPILLARY
Glucose-Capillary: 111 mg/dL — ABNORMAL HIGH (ref 70–99)
Glucose-Capillary: 147 mg/dL — ABNORMAL HIGH (ref 70–99)
Glucose-Capillary: 146 mg/dL — ABNORMAL HIGH (ref 70–99)
Glucose-Capillary: 131 mg/dL — ABNORMAL HIGH (ref 70–99)

## 2022-04-24 LAB — URINE CULTURE: Culture: NO GROWTH

## 2022-04-24 LAB — MAGNESIUM: Magnesium: 1.3 mg/dL — ABNORMAL LOW (ref 1.7–2.4)

## 2022-04-24 MED ORDER — VANCOMYCIN HCL 1250 MG/250ML IV SOLN
1250.0000 mg | INTRAVENOUS | Status: DC
  Administered 2022-04-24 – 2022-04-25 (×2): 1250 mg via INTRAVENOUS
  Filled 2022-04-24 (×2): qty 250

## 2022-04-24 MED ORDER — METOPROLOL SUCCINATE ER 25 MG PO TB24
12.5000 mg | ORAL_TABLET | Freq: Two times a day (BID) | ORAL | Status: DC
  Administered 2022-04-24 – 2022-05-02 (×16): 12.5 mg via ORAL
  Filled 2022-04-24 (×17): qty 1

## 2022-04-24 NOTE — Progress Notes (Signed)
PROGRESS NOTE    Shane Chambers  PNT:[id number] DOB: 03/18/1961 DOA: 04/22/2022 PCP: Caprice Renshaw, MD   Brief Narrative:  61 y.o. male with medical history significant for diabetes mellitus, chronic systolic and diastolic CHF, depression, CABG x5, COPD, OSA, rectal cancer presented with bilateral lower extremity pain, swelling and redness.  On presentation, he was hypotensive with systolic blood pressure in the 80s with WBC of 13.9, lactic acid of 4.1 and subsequently 2.6; high-sensitivity troponin 110 and 116 subsequently and creatinine elevated at 2.45.  UA was not suggestive of UTI.  Chest x-ray showed no acute abnormality.  He was started on IV fluids and antibiotics.  Assessment & Plan:   Severe sepsis: Present on admission Bilateral lower leg cellulitis Lactic acidosis Leukocytosis -Presented with hypotension, leukocytosis, lactic acidosis, acute kidney injury secondary to possible bilateral lower extremity cellulitis.  UA and chest x-ray not suggestive of infection. -Blood pressure improving.  Blood cultures negative so far.  Continue broad-spectrum antibiotics.  No temperature spikes over the last 24 hours. -Left lower extremity venous Doppler was negative for DVT -WBCs worsening to 18 today.  Repeat a.m. labs.  Acute kidney injury -Recent baseline of 1.3-1.6.  Presented with creatinine of 2.45.  Creatinine improving to 1.84 today. DC iv fluids. Legs are starting to swell up. Repeat a.m. labs.  Losartan and torsemide on hold.  Elevated troponin History of CAD/CABG -High-sensitivity troponins 110 and 116 subsequently on presentation.  Possibly from demand ischemia from sepsis and hypotension.  No chest pain; EKG without any acute abnormalities.  No further work-up needed -Continue aspirin.  Resume beta-blocker.  Blood pressure has improved.  Chronic systolic and diastolic CHF -Compensated.  Strict input and output.  Daily weights.  Last echo in 2021 had shown EF of 55 to 60% with  grade 2 diastolic dysfunction. -Outpatient follow-up with PCP/cardiology.  Hold losartan and torsemide.  Might have to restart diuretics soon.  Diabetes mellitus type 2 -Blood sugars stable.  Continue lower-dose of NPH  Hypertension -Patient was hypotensive on presentation.  Blood pressure improving.  Torsemide, metoprolol and losartan on hold.  Resume metoprolol.  BPH -Continue tamsulosin -Patient needed straight in and out cath this morning for retention.  Continue the same if patient has recurrence of retention.  Outpatient follow-up with urology  Depression -Continue amitriptyline and venlafaxine  Possible anemia of chronic disease -From chronic illnesses.  Hemoglobin currently stable  Hyponatremia -Mild.  Monitor  Hypomagnesemia -Replace.  Repeat a.m. labs  Hyperkalemia -Resolved  Rectal cancer -Follows with Dr. Delton Coombes.  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  Currently under surveillance.  Outpatient follow-up with oncology.  Morbid obesity -Outpatient follow-up  Physical deconditioning -Patient is from SNF and will have to return back to SNF.  PT following.  DVT prophylaxis: Heparin Code Status: Full Family Communication: None at bedside Disposition Plan: Status is: Inpatient Remains inpatient appropriate because: Of severity of illness.  Need for antibiotics.   Consultants: None  Procedures: None  Antimicrobials: Rocephin and vancomycin from 04/22/2022 onwards   Subjective: Patient seen and examined at bedside.  Slow to respond, poor historian.  Denies worsening shortness of breath, nausea, vomiting.  Complains of some hip pain and lower extremity pain.  Cannot tell if he feels any better since admission  Objective: Vitals:   04/23/22 2041 04/24/22 0022 04/24/22 0500 04/24/22 0601  BP: 136/64 117/61  136/82  Pulse: (!) 109 (!) 117  (!) 104  Resp: '19 20  20  '$ Temp: 98.8  F (37.1 C) 98 F (36.7 C)  98 F (36.7 C)  TempSrc:  Oral     SpO2: 99% 94%  99%  Weight:   130.5 kg   Height:        Intake/Output Summary (Last 24 hours) at 04/24/2022 0743 Last data filed at 04/24/2022 0644 Gross per 24 hour  Intake 963.52 ml  Output 1375 ml  Net -411.48 ml    Filed Weights   04/22/22 1558 04/23/22 0312 04/24/22 0500  Weight: 125.6 kg 126.4 kg 130.5 kg    Examination:  General: On room air.  No distress.  Looks chronically ill and deconditioned. ENT/neck: No thyromegaly.  JVD is not elevated  respiratory: Decreased breath sounds at bases bilaterally with some crackles; no wheezing  CVS: S1-S2 heard, tachycardic  abdominal: Soft, nontender, slightly distended; no organomegaly,  bowel sounds are heard Extremities: Bilateral lower extremity swelling worse today; no cyanosis  CNS: Awake; still slow to respond.  Poor historian.  No focal neurologic deficit.  Moves extremities Lymph: No obvious lymphadenopathy Skin: Bilateral lower extremity erythematous, edematous, tender from mid shin to above ankle with open wounds to bilateral lower extremities with not much discharge currently; some bullae present.  No obvious ecchymosis psych: Affect is flat mostly.  Currently not agitated. musculoskeletal: No obvious joint swelling/deformity     Data Reviewed: I have personally reviewed following labs and imaging studies  CBC: Recent Labs  Lab 04/22/22 1637 04/22/22 1953 04/23/22 0403 04/24/22 0526  WBC 17.9* 13.9* 14.5* 18.0*  NEUTROABS  --  10.7*  --  15.6*  HGB 10.4* 9.6* 9.8* 9.7*  HCT 31.9* 30.1* 31.3* 30.3*  MCV 91.7 92.9 93.7 93.2  PLT 257 216 210 619    Basic Metabolic Panel: Recent Labs  Lab 04/22/22 1953 04/23/22 0403 04/24/22 0526  NA 136 135 134*  K 5.2* 4.8 3.9  CL 102 103 104  CO2 26 21* 22  GLUCOSE 120* 112* 131*  BUN 58* 63* 54*  CREATININE 2.45* 2.46* 1.84*  CALCIUM 7.4* 7.7* 7.6*  MG  --   --  1.3*    GFR: Estimated Creatinine Clearance: 57.8 mL/min (A) (by C-G formula based on  SCr of 1.84 mg/dL (H)). Liver Function Tests: Recent Labs  Lab 04/22/22 1953 04/24/22 0526  AST 64* 46*  ALT 24 25  ALKPHOS 71 97  BILITOT 1.1 0.7  PROT 5.8* 6.4*  ALBUMIN 2.6* 2.7*    No results for input(s): "LIPASE", "AMYLASE" in the last 168 hours. No results for input(s): "AMMONIA" in the last 168 hours. Coagulation Profile: Recent Labs  Lab 04/22/22 1953  INR 1.6*    Cardiac Enzymes: No results for input(s): "CKTOTAL", "CKMB", "CKMBINDEX", "TROPONINI" in the last 168 hours. BNP (last 3 results) No results for input(s): "PROBNP" in the last 8760 hours. HbA1C: No results for input(s): "HGBA1C" in the last 72 hours. CBG: Recent Labs  Lab 04/23/22 0752 04/23/22 1105 04/23/22 1617 04/23/22 2148 04/24/22 0725  GLUCAP 99 114* 166* 133* 111*    Lipid Profile: No results for input(s): "CHOL", "HDL", "LDLCALC", "TRIG", "CHOLHDL", "LDLDIRECT" in the last 72 hours. Thyroid Function Tests: No results for input(s): "TSH", "T4TOTAL", "FREET4", "T3FREE", "THYROIDAB" in the last 72 hours. Anemia Panel: No results for input(s): "VITAMINB12", "FOLATE", "FERRITIN", "TIBC", "IRON", "RETICCTPCT" in the last 72 hours. Sepsis Labs: Recent Labs  Lab 04/22/22 1637 04/22/22 1953  LATICACIDVEN 4.1* 2.6*     Recent Results (from the past 240 hour(s))  Culture, blood (Routine x  2)     Status: None (Preliminary result)   Collection Time: 04/22/22  4:37 PM   Specimen: Right Antecubital; Blood  Result Value Ref Range Status   Specimen Description RIGHT ANTECUBITAL  Final   Special Requests   Final    BOTTLES DRAWN AEROBIC AND ANAEROBIC Blood Culture results may not be optimal due to an inadequate volume of blood received in culture bottles   Culture   Final    NO GROWTH 2 DAYS Performed at [facility], [address] [place], [place] [address]    Report Status PENDING  Incomplete  Culture, blood (Routine x 2)     Status: None (Preliminary result)   Collection Time: 04/22/22   4:37 PM   Specimen: Left Antecubital; Blood  Result Value Ref Range Status   Specimen Description LEFT ANTECUBITAL  Final   Special Requests   Final    BOTTLES DRAWN AEROBIC AND ANAEROBIC Blood Culture adequate volume   Culture   Final    NO GROWTH 2 DAYS Performed at [facility], [address], [place], [place] [address]    Report Status PENDING  Incomplete  MRSA Next Gen by PCR, Nasal     Status: Abnormal   Collection Time: 04/23/22 11:55 AM   Specimen: Nasal Mucosa; Nasal Swab  Result Value Ref Range Status   MRSA by PCR Next Gen DETECTED (A) NOT DETECTED Final    Comment: RESULT CALLED TO, READ BACK BY AND VERIFIED WITH: JENNIFER KING @ 7782 ON 04/23/22 C VARNER (NOTE) The GeneXpert MRSA Assay (FDA approved for NASAL specimens only), is one component of a comprehensive MRSA colonization surveillance program. It is not intended to diagnose MRSA infection nor to guide or monitor treatment for MRSA infections. Test performance is not FDA approved in patients less than 67 years old. Performed at [facility], [address] [place], [place] [address]          Radiology Studies: DG HIPS BILAT WITH PELVIS MIN 5 VIEWS  Result Date: 04/23/2022 CLINICAL DATA:  Bilateral hip pain. EXAM: DG HIP (WITH OR WITHOUT PELVIS) 5+V BILAT COMPARISON:  None Available. FINDINGS: Both hip joint spaces are preserved. There is bilateral acetabular spurring. Femoral heads are well seated. No fracture, erosion, or bony destruction. Intact pubic rami. Pubic symphysis and sacroiliac joints are congruent. Prominent vascular calcifications. IMPRESSION: Mild degenerative acetabular spurring of both hips. Electronically Signed   By: Keith Rake M.D.   On: 04/23/2022 18:42   US Venous Img Lower Unilateral Left (DVT)  Result Date: 04/23/2022 CLINICAL DATA:  61 year old with left lower extremity swelling. EXAM: LEFT LOWER EXTREMITY VENOUS DOPPLER ULTRASOUND TECHNIQUE: Gray-scale sonography with graded  compression, as well as color Doppler and duplex ultrasound were performed to evaluate the lower extremity deep venous systems from the level of the common femoral vein and including the common femoral, femoral, profunda femoral, popliteal and calf veins including the posterior tibial, peroneal and gastrocnemius veins when visible. The superficial great saphenous vein was also interrogated. Spectral Doppler was utilized to evaluate flow at rest and with distal augmentation maneuvers in the common femoral, femoral and popliteal veins. COMPARISON:  Venous ultrasound 11/23/2020 FINDINGS: Contralateral Common Femoral Vein: Respiratory phasicity is normal and symmetric with the symptomatic side. No evidence of thrombus. Normal compressibility. Common Femoral Vein: No evidence of thrombus. Normal compressibility, respiratory phasicity and response to augmentation. Saphenofemoral Junction: No evidence of thrombus. Normal compressibility and flow on color Doppler imaging. Profunda Femoral Vein: No evidence of thrombus.  Normal compressibility and flow on color Doppler imaging. Femoral Vein: No evidence of thrombus. Normal compressibility, respiratory phasicity and response to augmentation. Popliteal Vein: No evidence of thrombus. Normal compressibility, respiratory phasicity and response to augmentation. Calf Veins: Visualized left deep calf veins are patent without thrombus. Other Findings:  None. IMPRESSION: Negative for deep venous thrombosis in left lower extremity. Electronically Signed   By: Markus Daft M.D.   On: 04/23/2022 11:57   DG Chest Port 1 View  Result Date: 04/22/2022 CLINICAL DATA:  Sepsis. EXAM: PORTABLE CHEST 1 VIEW COMPARISON:  Chest x-ray 11/22/2020 FINDINGS: Median sternotomy wires are present. Lung volumes are low. The heart size and mediastinal contours are within normal limits. Both lungs are clear. The visualized skeletal structures are unremarkable. IMPRESSION: No active disease. Electronically  Signed   By: Ronney Asters M.D.   On: 04/22/2022 17:14        Scheduled Meds:  amitriptyline  25 mg Oral QHS   aspirin EC  81 mg Oral Q breakfast   Chlorhexidine Gluconate Cloth  6 each Topical Daily   heparin  5,000 Units Subcutaneous Q8H   insulin aspart  0-15 Units Subcutaneous TID WC   insulin aspart  0-5 Units Subcutaneous QHS   insulin aspart protamine- aspart  8 Units Subcutaneous BID WC   levothyroxine  75 mcg Oral Q0600   mometasone-formoterol  2 puff Inhalation BID   mupirocin ointment   Nasal BID   pantoprazole  40 mg Oral Daily   tamsulosin  0.8 mg Oral Daily   venlafaxine XR  225 mg Oral Daily   Continuous Infusions:  sodium chloride Stopped (04/23/22 1755)   cefTRIAXone (ROCEPHIN)  IV 200 mL/hr at 04/23/22 1756   vancomycin 1,000 mg (04/23/22 2205)          Aline August, MD [facility] 04/24/2022, 7:43 AM

## 2022-04-24 NOTE — Progress Notes (Signed)
Physical Therapy Treatment Patient Details Name: Shane Chambers MRN: [id number] DOB: 31-Jul-1961 Today's Date: 04/24/2022   History of Present Illness Shane Chambers is a 61 y.o. male with medical history significant for diabetes mellitus, systolic and diastolic CHF, depression, CABG x5, COPD OSA, rectal cancer.  Patient was brought to the ED reports of bilateral lower extremity pain swelling and redness.  Patient tells me he has had chronic pain in his lower extremities but the pain was worse yesterday.  Also reports feeling ill.  Denies vomiting, no loose stools no abdominal pain, denies chest pain, no cough no difficulty breathing.  No neck pain or stiffness, denies urinary symptoms.  Reports pain in his buttock area, but he denies any ulcers involving his back or buttock area.    PT Comments    Patient presents seated at EOB, is awake, alert and oriented. He had just finished breakfast. Patient reports increased pain throughout BLE with LT> RT. Patient limited in ability to perform seated LE strengthening and AROM exercise due to LE edema and pain. Patient able to perform sit to stand x 2 with use of RW and Min A. Able to take 3-4 small, shuffling steps at bedside, but limited due to LLE pain with WB. Patient able to reposition to [facility] with Min A, and placed in supine with LE elevation for LE edema. Educated patient on continued performance of LE strength and ROM exercise as tolerated to maximize function. Phone and call bell in reach. Patient will benefit from continued physical therapy in hospital and recommended venue below to increase strength, balance, endurance for safe ADLs and gait.    Recommendations for follow up therapy are one component of a multi-disciplinary discharge planning process, led by the attending physician.  Recommendations may be updated based on patient status, additional functional criteria and insurance authorization.  Follow Up Recommendations  Skilled nursing-short  term rehab (<3 hours/day)     Assistance Recommended at Discharge Set up Supervision/Assistance  Patient can return home with the following A lot of help with walking and/or transfers;A little help with bathing/dressing/bathroom;Help with stairs or ramp for entrance;Assistance with cooking/housework   Equipment Recommendations  None recommended by PT    Recommendations for Other Services       Precautions / Restrictions Precautions Precautions: Fall Restrictions Weight Bearing Restrictions: No     Mobility  Bed Mobility Overal bed mobility: Needs Assistance Bed Mobility: Supine to Sit, Sit to Supine     Supine to sit: Min assist Sit to supine: Min assist   General bed mobility comments: Assist with LLE, uses hand rails    Transfers Overall transfer level: Needs assistance Equipment used: Rolling walker (2 wheels)   Sit to Stand: Min assist           General transfer comment: slow labored movement    Ambulation/Gait Ambulation/Gait assistance: Min assist Gait Distance (Feet): 3 Feet Assistive device: Rolling walker (2 wheels) Gait Pattern/deviations: Decreased step length - right, Decreased step length - left, Decreased stride length Gait velocity: decreased     General Gait Details: Few small, steps at bed side. Limited by LLE pain with WB   Stairs             Wheelchair Mobility    Modified Rankin (Stroke Patients Only)       Balance Overall balance assessment: Needs assistance Sitting-balance support: Feet supported, No upper extremity supported Sitting balance-Leahy Scale: Fair Sitting balance - Comments: fair/good seated at EOB  Standing balance support: During functional activity, Bilateral upper extremity supported Standing balance-Leahy Scale: Fair Standing balance comment: using RW                            Cognition Arousal/Alertness: Awake/alert Behavior During Therapy: WFL for tasks assessed/performed Overall  Cognitive Status: Within Functional Limits for tasks assessed                                          Exercises General Exercises - Lower Extremity Ankle Circles/Pumps: AROM, Both, 5 reps Long Arc Quad: Strengthening, Both, 5 reps, Seated    General Comments        Pertinent Vitals/Pain Pain Assessment Pain Assessment: 0-10 Pain Score: 8  Pain Location: BLE, LLE worse especially knee Pain Descriptors / Indicators: Guarding, Grimacing, Sore Pain Intervention(s): Limited activity within patient's tolerance, Monitored during session    Home Living                          Prior Function            PT Goals (current goals can now be found in the care plan section) Acute Rehab PT Goals Patient Stated Goal: return home PT Goal Formulation: With patient Time For Goal Achievement: 05/07/22 Potential to Achieve Goals: Good Progress towards PT goals: Progressing toward goals    Frequency    Min 3X/week      PT Plan Current plan remains appropriate    Co-evaluation     PT goals addressed during session: Mobility/safety with mobility;Strengthening/ROM        AM-PAC PT "6 Clicks" Mobility   Outcome Measure  Help needed turning from your back to your side while in a flat bed without using bedrails?: A Little Help needed moving from lying on your back to sitting on the side of a flat bed without using bedrails?: A Little Help needed moving to and from a bed to a chair (including a wheelchair)?: A Little Help needed standing up from a chair using your arms (e.g., wheelchair or bedside chair)?: A Little Help needed to walk in hospital room?: A Lot Help needed climbing 3-5 steps with a railing? : Total 6 Click Score: 15    End of Session   Activity Tolerance: Patient limited by fatigue;Patient limited by pain Patient left: with call bell/phone within reach;in bed Nurse Communication: Mobility status PT Visit Diagnosis: Unsteadiness on  feet (R26.81);Other abnormalities of gait and mobility (R26.89);Muscle weakness (generalized) (M62.81)     Time: 0940-1005 PT Time Calculation (min) (ACUTE ONLY): 25 min  Charges:  $Therapeutic Exercise: 8-22 mins                    10:20 AM, 04/24/22 Josue Hector PT DPT  Physical Therapist with [facility]  [phone]

## 2022-04-25 ENCOUNTER — Inpatient Hospital Stay (HOSPITAL_COMMUNITY): Payer: Medicare Other

## 2022-04-25 DIAGNOSIS — L03116 Cellulitis of left lower limb: Secondary | ICD-10-CM | POA: Diagnosis not present

## 2022-04-25 DIAGNOSIS — D638 Anemia in other chronic diseases classified elsewhere: Secondary | ICD-10-CM | POA: Diagnosis not present

## 2022-04-25 DIAGNOSIS — N179 Acute kidney failure, unspecified: Secondary | ICD-10-CM | POA: Diagnosis not present

## 2022-04-25 DIAGNOSIS — A419 Sepsis, unspecified organism: Secondary | ICD-10-CM | POA: Diagnosis not present

## 2022-04-25 LAB — CBC WITH DIFFERENTIAL/PLATELET
Abs Immature Granulocytes: 0.32 10*3/uL — ABNORMAL HIGH (ref 0.00–0.07)
Basophils Absolute: 0.1 10*3/uL (ref 0.0–0.1)
Basophils Relative: 0 %
Eosinophils Absolute: 0.4 10*3/uL (ref 0.0–0.5)
Eosinophils Relative: 2 %
HCT: 29 % — ABNORMAL LOW (ref 39.0–52.0)
Hemoglobin: 9.2 g/dL — ABNORMAL LOW (ref 13.0–17.0)
Immature Granulocytes: 2 %
Lymphocytes Relative: 4 %
Lymphs Abs: 0.9 10*3/uL (ref 0.7–4.0)
MCH: 29.1 pg (ref 26.0–34.0)
MCHC: 31.7 g/dL (ref 30.0–36.0)
MCV: 91.8 fL (ref 80.0–100.0)
Monocytes Absolute: 1.2 10*3/uL — ABNORMAL HIGH (ref 0.1–1.0)
Monocytes Relative: 6 %
Neutro Abs: 17.7 10*3/uL — ABNORMAL HIGH (ref 1.7–7.7)
Neutrophils Relative %: 86 %
Platelets: 252 10*3/uL (ref 150–400)
RBC: 3.16 MIL/uL — ABNORMAL LOW (ref 4.22–5.81)
RDW: 13.3 % (ref 11.5–15.5)
WBC: 20.5 10*3/uL — ABNORMAL HIGH (ref 4.0–10.5)
nRBC: 0 % (ref 0.0–0.2)

## 2022-04-25 LAB — GLUCOSE, CAPILLARY
Glucose-Capillary: 94 mg/dL (ref 70–99)
Glucose-Capillary: 159 mg/dL — ABNORMAL HIGH (ref 70–99)
Glucose-Capillary: 129 mg/dL — ABNORMAL HIGH (ref 70–99)
Glucose-Capillary: 159 mg/dL — ABNORMAL HIGH (ref 70–99)

## 2022-04-25 LAB — BASIC METABOLIC PANEL
Anion gap: 10 (ref 5–15)
BUN: 41 mg/dL — ABNORMAL HIGH (ref 6–20)
CO2: 21 mmol/L — ABNORMAL LOW (ref 22–32)
Calcium: 7.7 mg/dL — ABNORMAL LOW (ref 8.9–10.3)
Chloride: 104 mmol/L (ref 98–111)
Creatinine, Ser: 1.4 mg/dL — ABNORMAL HIGH (ref 0.61–1.24)
GFR, Estimated: 58 mL/min — ABNORMAL LOW (ref >60)
Glucose, Bld: 95 mg/dL (ref 70–99)
Potassium: 3.8 mmol/L (ref 3.5–5.1)
Sodium: 135 mmol/L (ref 135–145)

## 2022-04-25 LAB — MAGNESIUM: Magnesium: 1.3 mg/dL — ABNORMAL LOW (ref 1.7–2.4)

## 2022-04-25 IMAGING — US US EXTREM LOW*R* LIMITED
1 series · 14 of 25 positions shown · non-contrast
Comparison: None Available.

CLINICAL DATA: Bilateral soft tissue ultrasound

EXAM:
ULTRASOUND bilateral LOWER EXTREMITY LIMITED
TECHNIQUE: Ultrasound examination of the lower extremity soft tissues was
performed in the area of clinical concern.

[Series 1: us left lower extrem ltd soft tissue non vascular · 27 acquisitions, 14 frames shown]
[im 1/27]
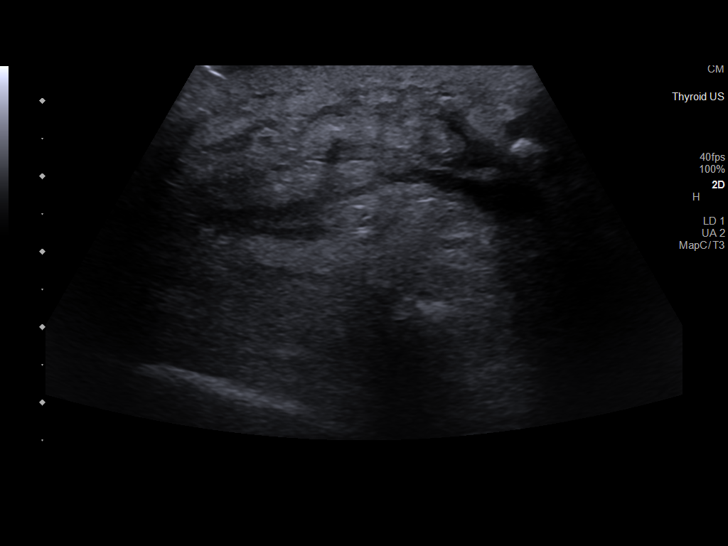
[im 3/27]
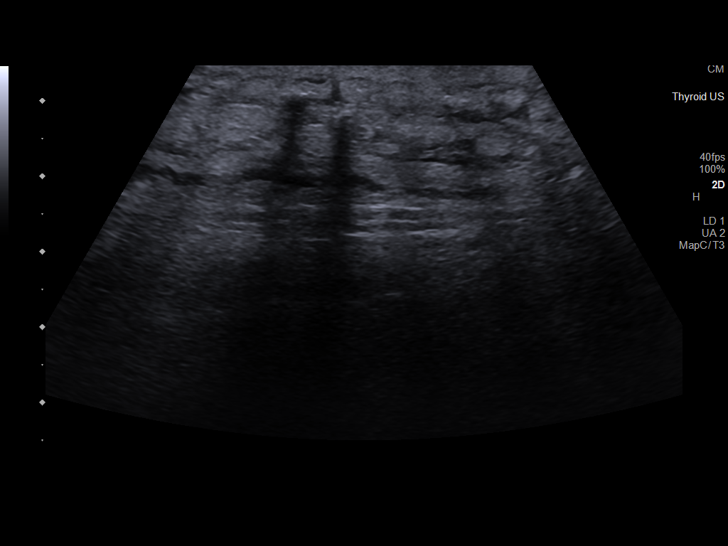
[im 5/27]
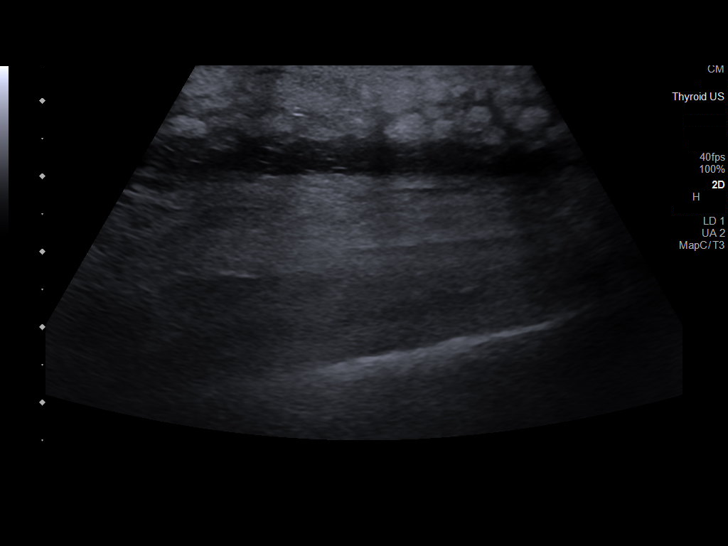
[im 7/27]
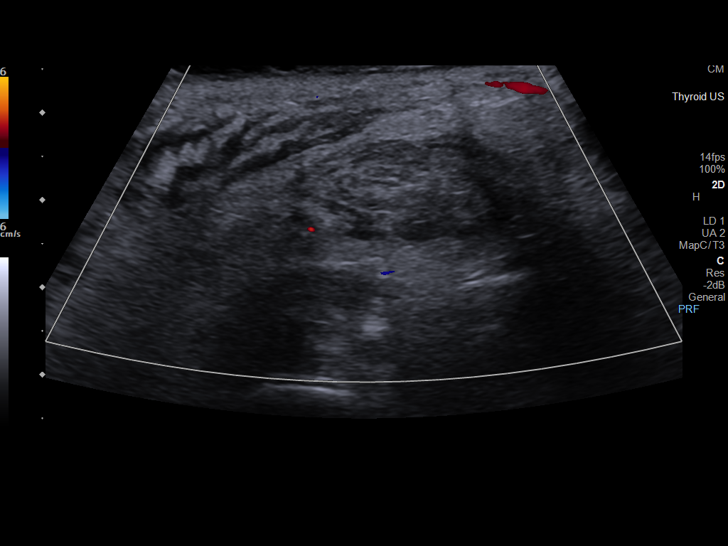
[im 9/27]
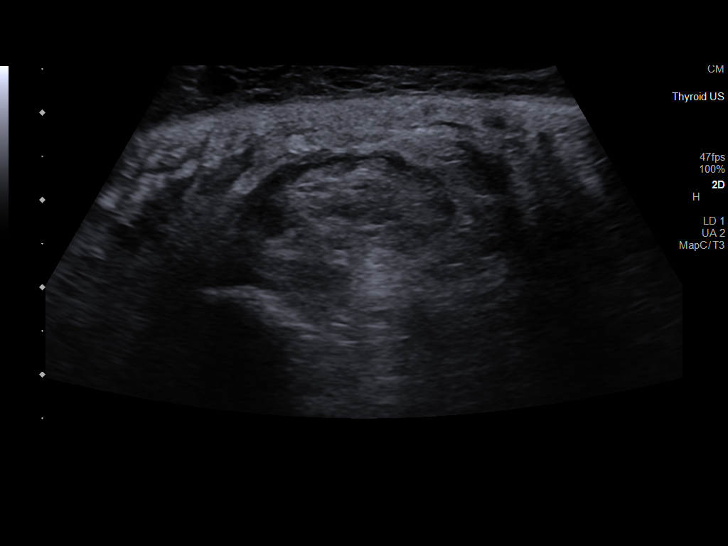
[im 10/27]
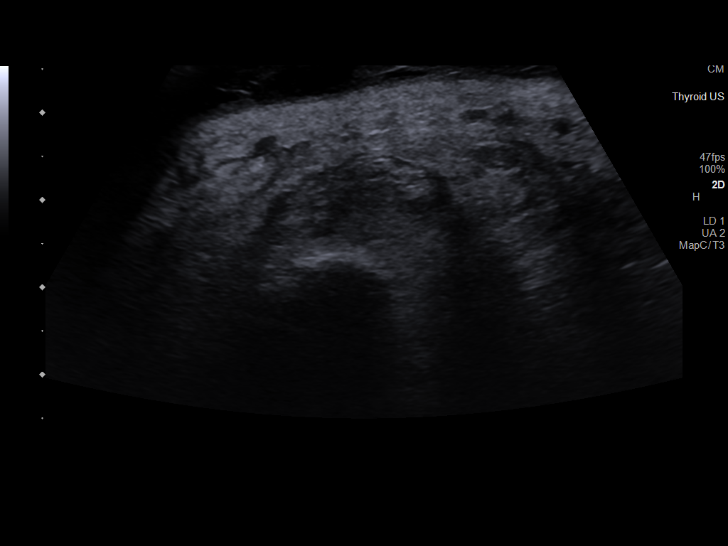
[im 12/27]
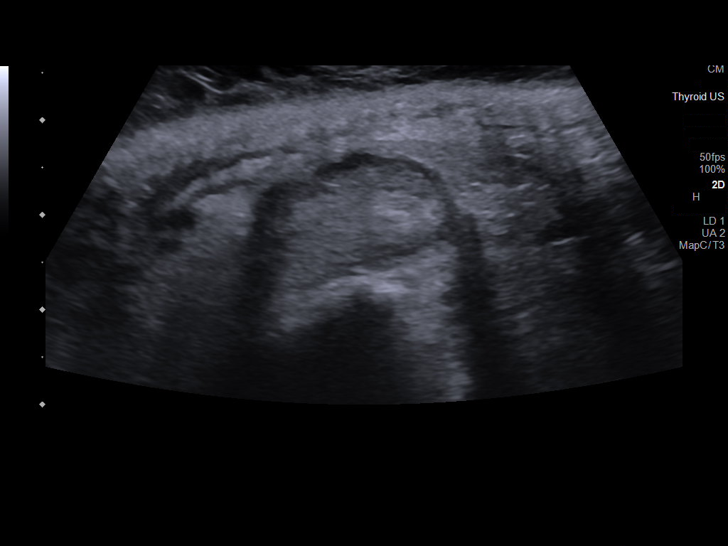
[im 15/27]
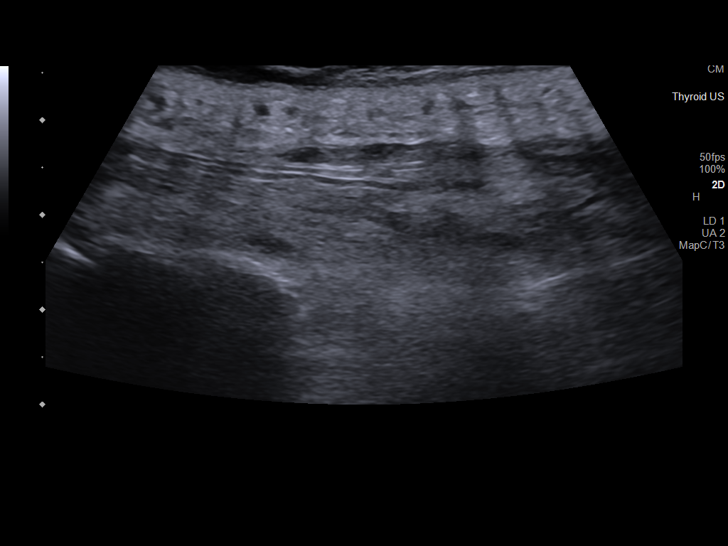
[im 17/27]
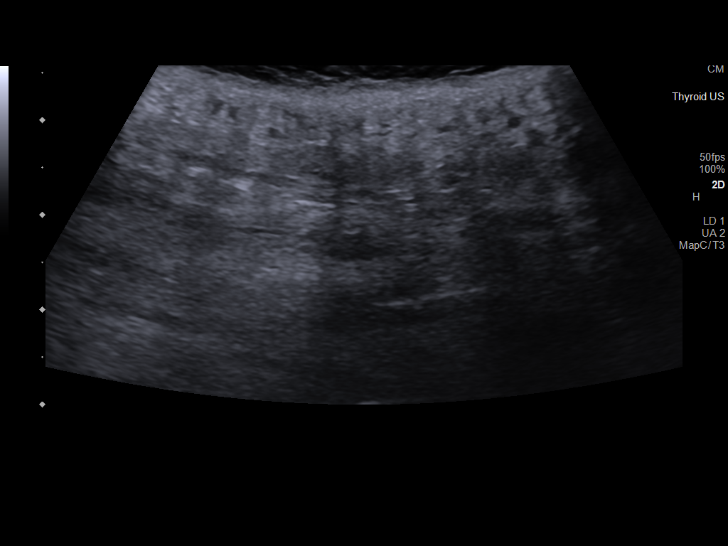
[im 18/27]
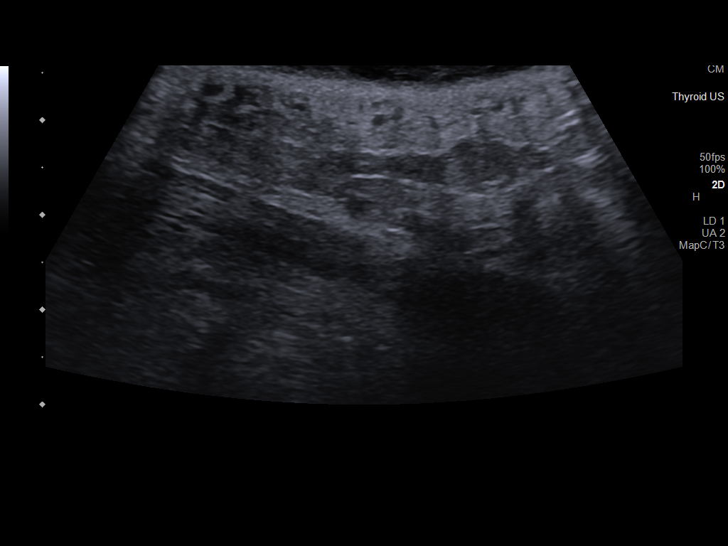
[im 20/27]
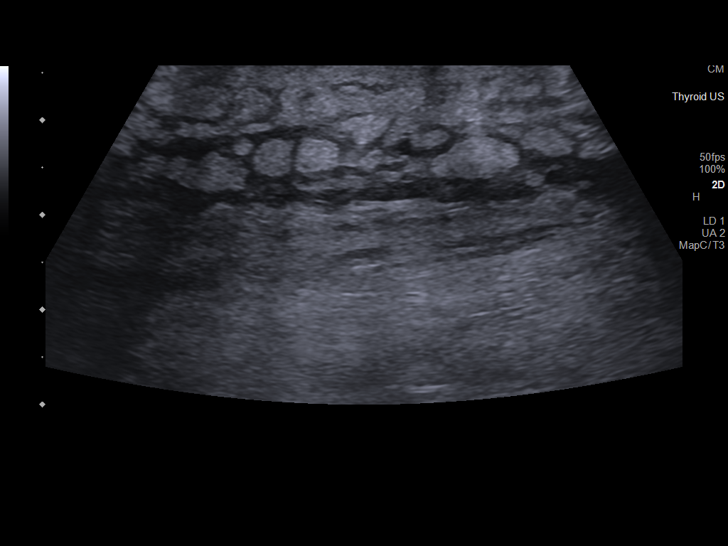
[im 22/27]
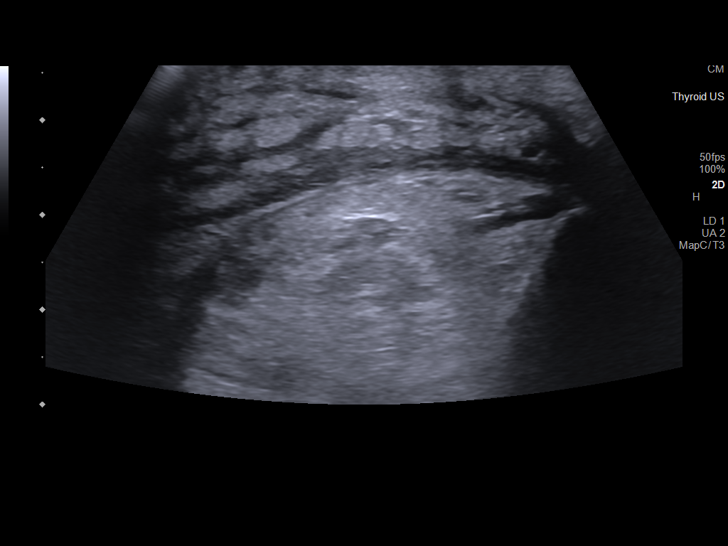
[im 24/27]
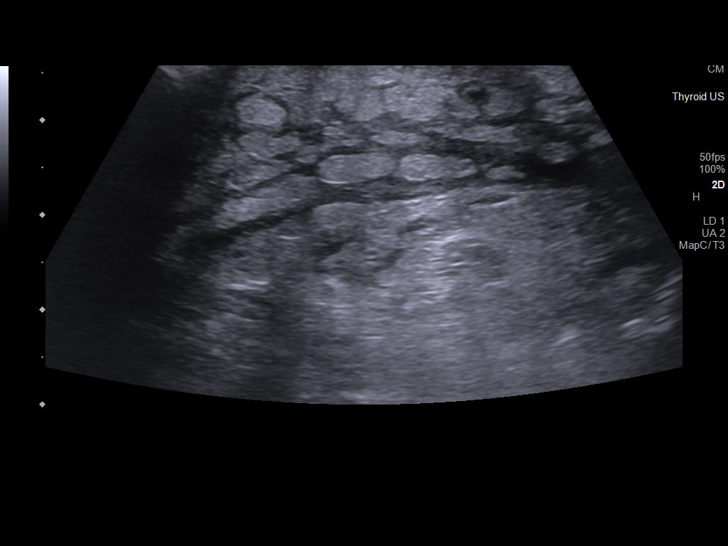
[im 27/27]
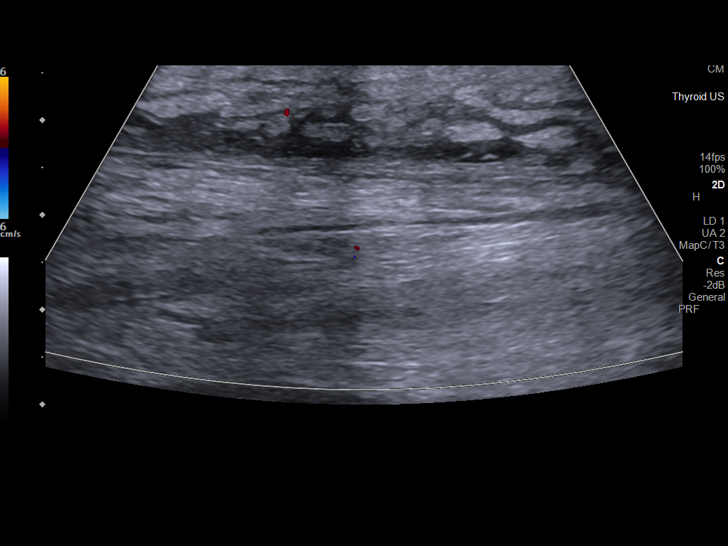

[14 of 25 positions shown; findings below may reference images not displayed]

FINDINGS: Targeted ultrasound was performed of the bilateral anterior lower
legs and left medial dorsal foot. Significant soft tissue edema is
seen with no evidence of organized fluid collection.
IMPRESSION: Soft tissue edema, compatible with history of cellulitis. No
evidence of organized fluid collection.

## 2022-04-25 IMAGING — US US EXTREM LOW*L* LIMITED
1 series · 14 of 25 positions shown · non-contrast
Comparison: None Available.

CLINICAL DATA: Bilateral soft tissue ultrasound

EXAM:
ULTRASOUND bilateral LOWER EXTREMITY LIMITED
TECHNIQUE: Ultrasound examination of the lower extremity soft tissues was
performed in the area of clinical concern.

[Series 1: us left lower extrem ltd soft tissue non vascular · 27 acquisitions, 14 frames shown]
[im 1/27]
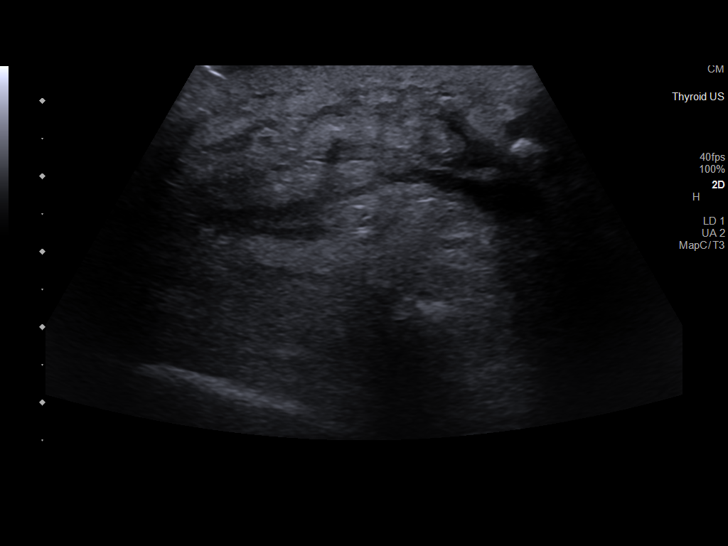
[im 3/27]
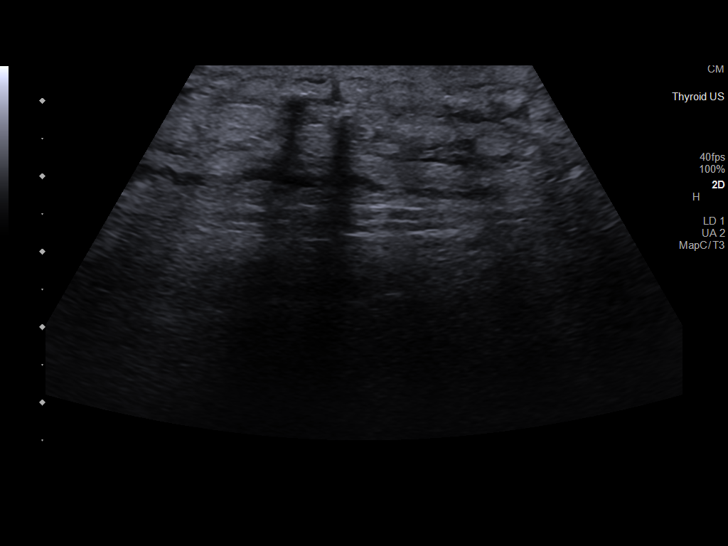
[im 5/27]
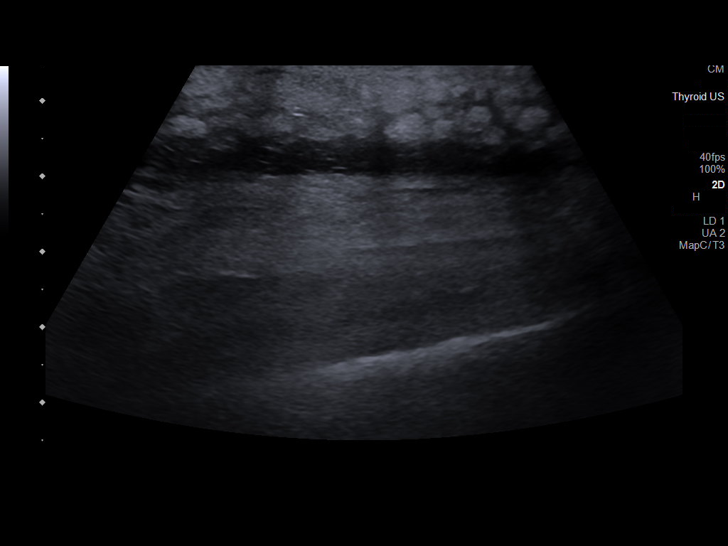
[im 7/27]
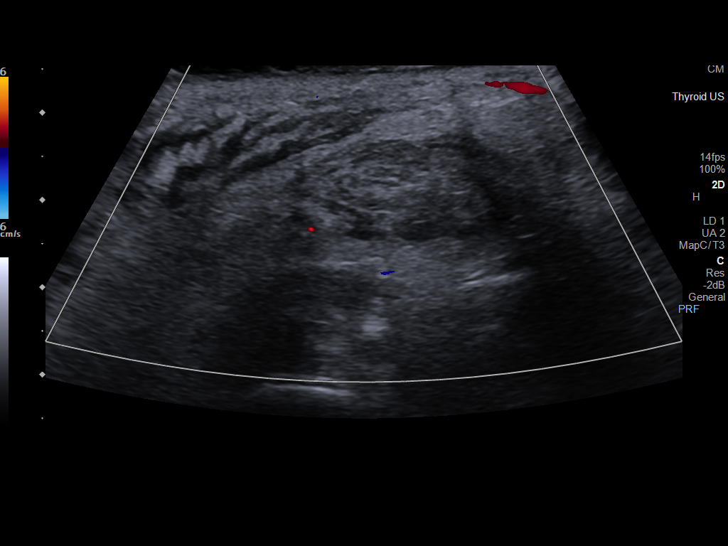
[im 9/27]
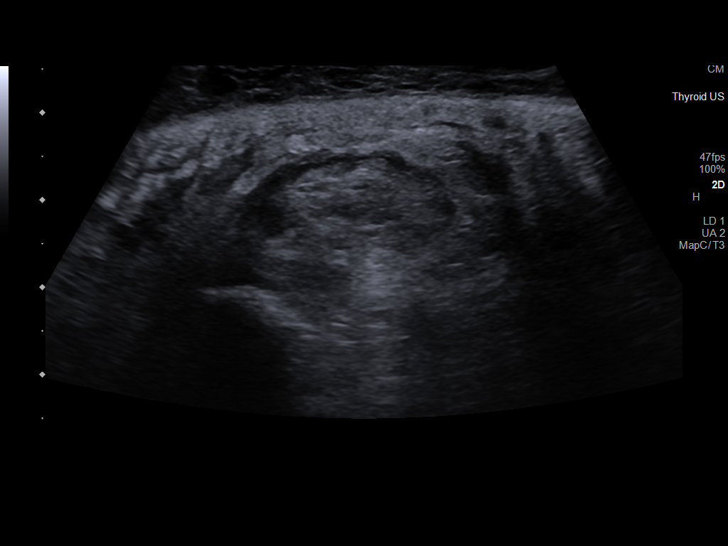
[im 10/27]
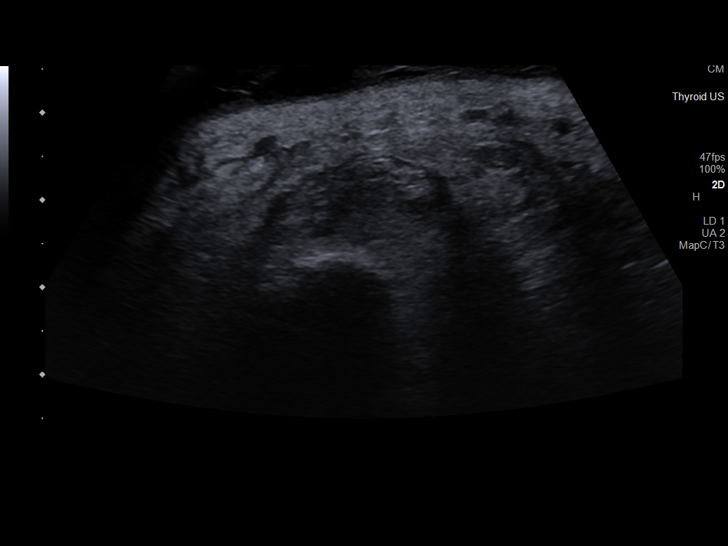
[im 12/27]
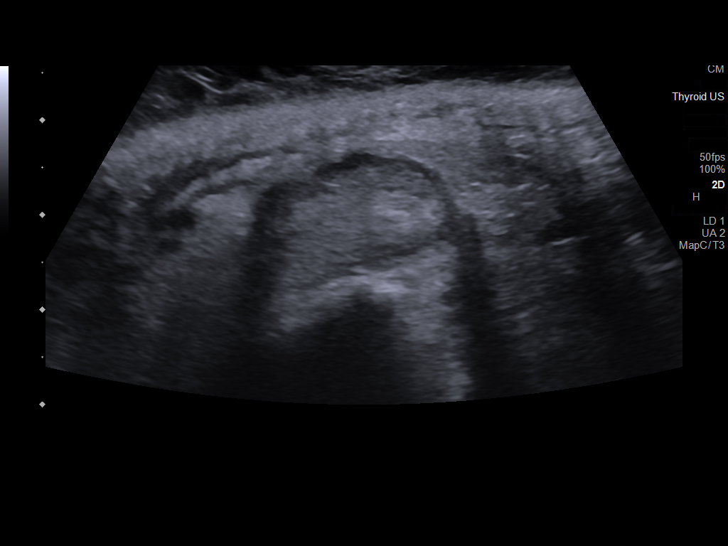
[im 15/27]
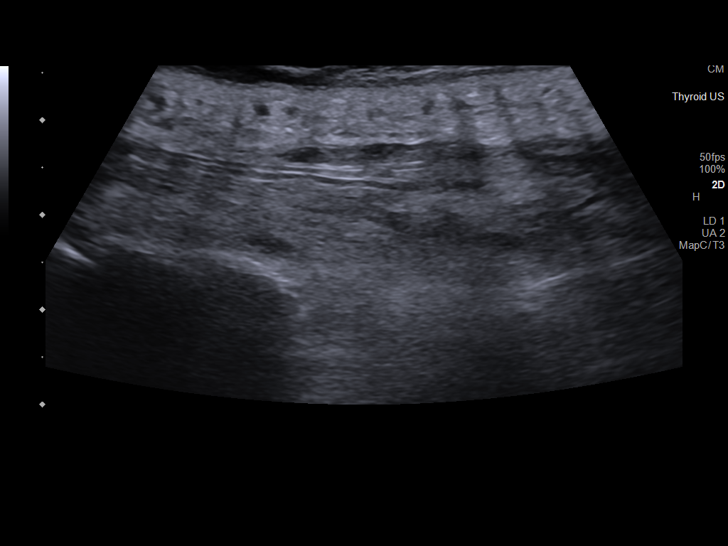
[im 17/27]
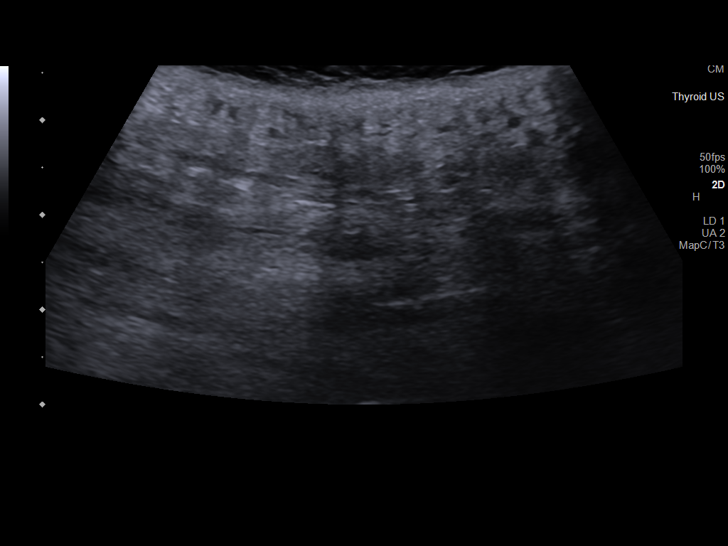
[im 18/27]
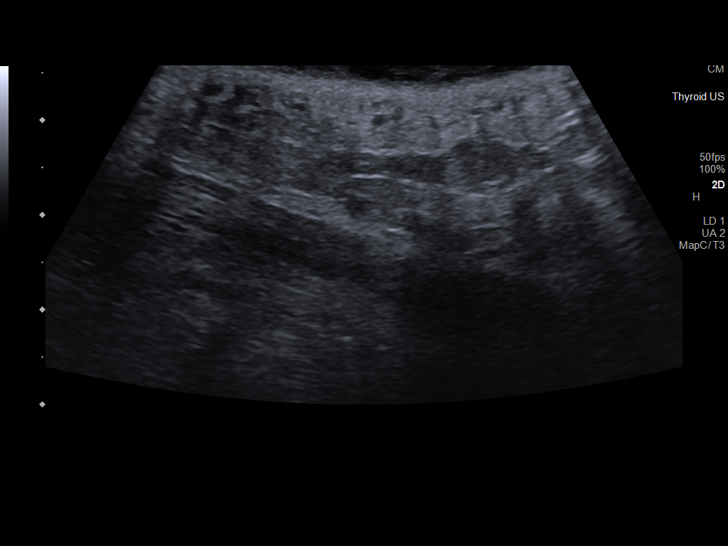
[im 20/27]
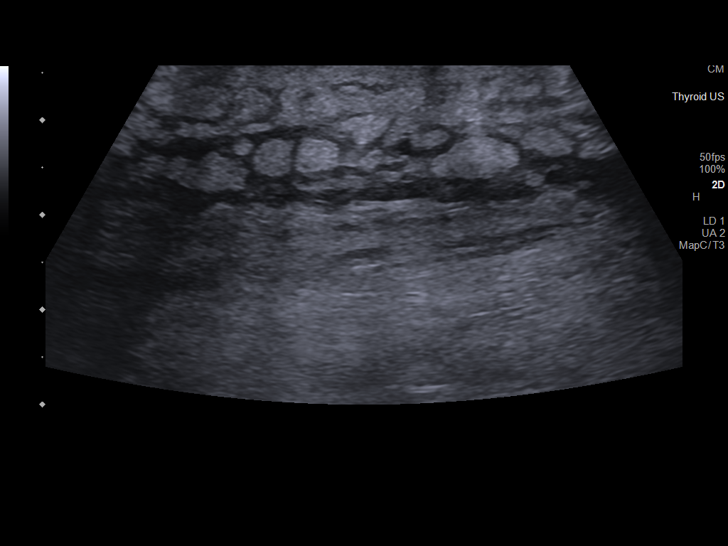
[im 22/27]
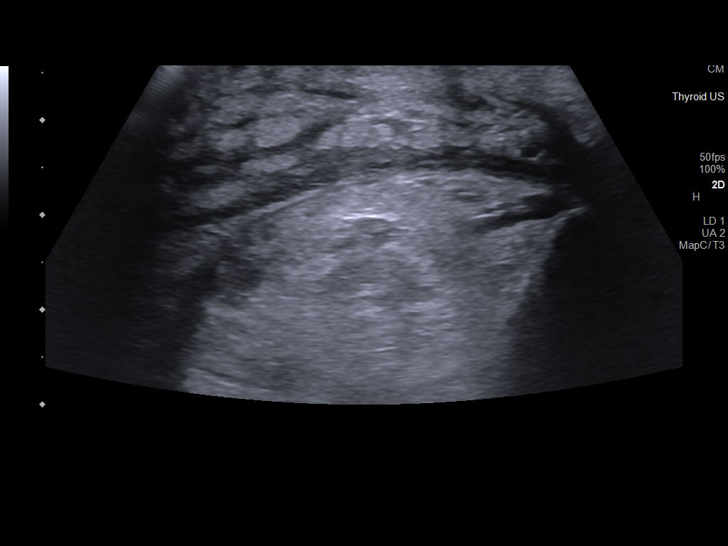
[im 24/27]
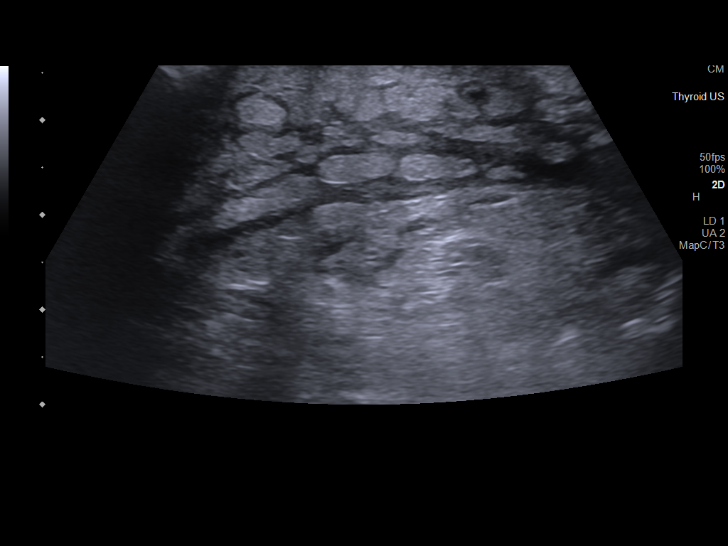
[im 27/27]
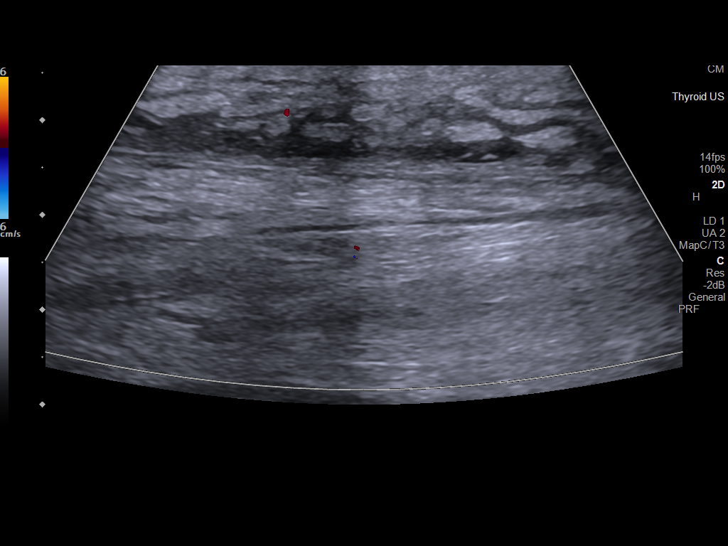

[14 of 25 positions shown; findings below may reference images not displayed]

FINDINGS: Targeted ultrasound was performed of the bilateral anterior lower
legs and left medial dorsal foot. Significant soft tissue edema is
seen with no evidence of organized fluid collection.
IMPRESSION: Soft tissue edema, compatible with history of cellulitis. No
evidence of organized fluid collection.

## 2022-04-25 MED ORDER — FUROSEMIDE 10 MG/ML IJ SOLN
60.0000 mg | Freq: Two times a day (BID) | INTRAMUSCULAR | Status: DC
  Administered 2022-04-25 – 2022-04-26 (×4): 60 mg via INTRAVENOUS
  Filled 2022-04-25 (×4): qty 6

## 2022-04-25 MED ORDER — MAGNESIUM SULFATE 4 GM/100ML IV SOLN
4.0000 g | Freq: Once | INTRAVENOUS | Status: AC
  Administered 2022-04-25: 4 g via INTRAVENOUS
  Filled 2022-04-25: qty 100

## 2022-04-25 MED ORDER — GABAPENTIN 300 MG PO CAPS
600.0000 mg | ORAL_CAPSULE | Freq: Two times a day (BID) | ORAL | Status: DC
  Administered 2022-04-25 – 2022-05-02 (×15): 600 mg via ORAL
  Filled 2022-04-25 (×15): qty 2

## 2022-04-25 NOTE — Progress Notes (Signed)
PROGRESS NOTE    Shane Chambers  FTD:[id number] DOB: 03-Sep-1961 DOA: 04/22/2022 PCP: Caprice Renshaw, MD   Brief Narrative:  61 y.o. male with medical history significant for diabetes mellitus, chronic systolic and diastolic CHF, depression, CABG x5, COPD, OSA, rectal cancer presented with bilateral lower extremity pain, swelling and redness.  On presentation, he was hypotensive with systolic blood pressure in the 80s with WBC of 13.9, lactic acid of 4.1 and subsequently 2.6; high-sensitivity troponin 110 and 116 subsequently and creatinine elevated at 2.45.  UA was not suggestive of UTI.  Chest x-ray showed no acute abnormality.  He was started on IV fluids and antibiotics.  Assessment & Plan:   Severe sepsis: Present on admission Bilateral lower leg cellulitis Lactic acidosis Leukocytosis -Presented with hypotension, leukocytosis, lactic acidosis, acute kidney injury secondary to possible bilateral lower extremity cellulitis.  UA and chest x-ray not suggestive of infection. -Blood pressure improving.  Blood cultures negative so far.  Continue broad-spectrum antibiotics.  No temperature spikes over the last 48 hours. -Left lower extremity venous Doppler was negative for DVT -WBCs worsening to 20 today.  Will check bilateral lower extremity soft tissue ultrasound to rule out abscess.  Repeat a.m. labs. -Consult wound care  Acute kidney injury -Recent baseline of 1.3-1.6.  Presented with creatinine of 2.45.  Creatinine improving to 1.40 today.  Treated with IV fluids and discontinued on 04/24/2022.  Currently appears volume overloaded: We will start IV Lasix.  Repeat a.m. labs.  Losartan and torsemide on hold.  Elevated troponin History of CAD/CABG -High-sensitivity troponins 110 and 116 subsequently on presentation.  Possibly from demand ischemia from sepsis and hypotension.  No chest pain; EKG without any acute abnormalities.  No further work-up needed -Continue aspirin.  Beta-blocker resumed  on 04/24/2022.  Blood pressures have improved.  Chronic systolic and diastolic CHF -Compensated.  Strict input and output.  Daily weights.  Last echo in 2021 had shown EF of 55 to 60% with grade 2 diastolic dysfunction. -Outpatient follow-up with PCP/cardiology.  Diuretic plan as above.  Hold losartan and torsemide.    Diabetes mellitus type 2 -Blood sugars stable.  Continue lower-dose of NPH  Hypertension -Patient was hypotensive on presentation.  Blood pressure improving.  Torsemide and losartan on hold.  Continue metoprolol.  Start Lasix.  BPH -Continue tamsulosin -Patient needed straight in and out cath this morning for retention.  Continue the same if patient has recurrence of retention.  Outpatient follow-up with urology  Depression -Continue amitriptyline and venlafaxine  Possible anemia of chronic disease -From chronic illnesses.  Hemoglobin currently stable  Hyponatremia -Improved  Hypomagnesemia -Replace.  Repeat a.m. labs  Hyperkalemia -Resolved  Rectal cancer -Follows with Dr. Delton Coombes.  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  Currently under surveillance.  Outpatient follow-up with oncology.  Morbid obesity -Outpatient follow-up  Physical deconditioning -Patient is from SNF and will have to return back to SNF.  PT following.  DVT prophylaxis: Heparin Code Status: Full Family Communication: None at bedside Disposition Plan: Status is: Inpatient Remains inpatient appropriate because: Of severity of illness.  Need for antibiotics.   Consultants: None  Procedures: None  Antimicrobials: Rocephin and vancomycin from 04/22/2022 onwards   Subjective: Patient seen and examined at bedside.  Slow to respond, poor historian.  Does not feel any better.  Still feels that his legs are swollen and painful.  No overnight fever, agitation, vomiting reported. Objective: Vitals:   04/24/22 2035 04/24/22 2105 04/25/22 0500 04/25/22 0501  BP:   (!) 148/77  135/82  Pulse:  100  97  Resp:  20    Temp:  97.7 F (36.5 C)  98 F (36.7 C)  TempSrc:  Oral  Oral  SpO2: 93% 97%  95%  Weight:   127.5 kg   Height:        Intake/Output Summary (Last 24 hours) at 04/25/2022 0742 Last data filed at 04/24/2022 1900 Gross per 24 hour  Intake 1272.7 ml  Output 800 ml  Net 472.7 ml    Filed Weights   04/23/22 0312 04/24/22 0500 04/25/22 0500  Weight: 126.4 kg 130.5 kg 127.5 kg    Examination:  General: No acute distress.  Still on room air. Looks chronically ill and deconditioned. ENT/neck: No palpable neck masses.  No JVD elevation noted  respiratory: Bilateral decreased breath sounds at bases with scattered crackles CVS: Currently rate controlled; S1 and S2 are heard abdominal: Soft, nontender, distended mildly; no organomegaly, normal bowel sounds heard  extremities: Bilateral lower extremity are more swollen this morning; no clubbing CNS: Extremely slow to respond; alert.  Poor historian.  No focal neurologic deficit.  Able to move extremities Lymph: No cervical lymphadenopathy noted  skin: Bilateral lower extremity erythematous, edematous, tender from mid shin to above ankle with open wounds to bilateral lower extremities with not much discharge currently; scattered bullae present.  No obvious petechiae or other rashes psych: No signs of agitation; flat affect  musculoskeletal: No obvious joint swelling/deformity     Data Reviewed: I have personally reviewed following labs and imaging studies  CBC: Recent Labs  Lab 04/22/22 1637 04/22/22 1953 04/23/22 0403 04/24/22 0526 04/25/22 0455  WBC 17.9* 13.9* 14.5* 18.0* 20.5*  NEUTROABS  --  10.7*  --  15.6* 17.7*  HGB 10.4* 9.6* 9.8* 9.7* 9.2*  HCT 31.9* 30.1* 31.3* 30.3* 29.0*  MCV 91.7 92.9 93.7 93.2 91.8  PLT 257 216 210 231 790    Basic Metabolic Panel: Recent Labs  Lab 04/22/22 1953 04/23/22 0403 04/24/22 0526 04/25/22 0455  NA 136 135 134* 135  K 5.2* 4.8  3.9 3.8  CL 102 103 104 104  CO2 26 21* 22 21*  GLUCOSE 120* 112* 131* 95  BUN 58* 63* 54* 41*  CREATININE 2.45* 2.46* 1.84* 1.40*  CALCIUM 7.4* 7.7* 7.6* 7.7*  MG  --   --  1.3* 1.3*    GFR: Estimated Creatinine Clearance: 75.1 mL/min (A) (by C-G formula based on SCr of 1.4 mg/dL (H)). Liver Function Tests: Recent Labs  Lab 04/22/22 1953 04/24/22 0526  AST 64* 46*  ALT 24 25  ALKPHOS 71 97  BILITOT 1.1 0.7  PROT 5.8* 6.4*  ALBUMIN 2.6* 2.7*    No results for input(s): "LIPASE", "AMYLASE" in the last 168 hours. No results for input(s): "AMMONIA" in the last 168 hours. Coagulation Profile: Recent Labs  Lab 04/22/22 1953  INR 1.6*    Cardiac Enzymes: No results for input(s): "CKTOTAL", "CKMB", "CKMBINDEX", "TROPONINI" in the last 168 hours. BNP (last 3 results) No results for input(s): "PROBNP" in the last 8760 hours. HbA1C: No results for input(s): "HGBA1C" in the last 72 hours. CBG: Recent Labs  Lab 04/24/22 0725 04/24/22 1108 04/24/22 1607 04/24/22 2036 04/25/22 0726  GLUCAP 111* 147* 146* 131* 94    Lipid Profile: No results for input(s): "CHOL", "HDL", "LDLCALC", "TRIG", "CHOLHDL", "LDLDIRECT" in the last 72 hours. Thyroid Function Tests: No results for input(s): "TSH", "T4TOTAL", "FREET4", "T3FREE", "THYROIDAB" in the last 72  hours. Anemia Panel: No results for input(s): "VITAMINB12", "FOLATE", "FERRITIN", "TIBC", "IRON", "RETICCTPCT" in the last 72 hours. Sepsis Labs: Recent Labs  Lab 04/22/22 1637 04/22/22 1953  LATICACIDVEN 4.1* 2.6*     Recent Results (from the past 240 hour(s))  Culture, blood (Routine x 2)     Status: None (Preliminary result)   Collection Time: 04/22/22  4:37 PM   Specimen: Right Antecubital; Blood  Result Value Ref Range Status   Specimen Description RIGHT ANTECUBITAL  Final   Special Requests   Final    BOTTLES DRAWN AEROBIC AND ANAEROBIC Blood Culture results may not be optimal due to an inadequate volume of blood  received in culture bottles   Culture   Final    NO GROWTH 3 DAYS Performed at [facility], [address] [place], [place] [address]    Report Status PENDING  Incomplete  Culture, blood (Routine x 2)     Status: None (Preliminary result)   Collection Time: 04/22/22  4:37 PM   Specimen: Left Antecubital; Blood  Result Value Ref Range Status   Specimen Description LEFT ANTECUBITAL  Final   Special Requests   Final    BOTTLES DRAWN AEROBIC AND ANAEROBIC Blood Culture adequate volume   Culture   Final    NO GROWTH 3 DAYS Performed at [facility], [address], [place], [place] [address]    Report Status PENDING  Incomplete  Urine Culture     Status: None   Collection Time: 04/22/22  6:46 PM   Specimen: Urine, Clean Catch  Result Value Ref Range Status   Specimen Description   Final    URINE, CLEAN CATCH Performed at [facility], [address], [place], [place] [address]    Special Requests   Final    NONE Performed at [facility], [address], [place], [place] [address]    Culture   Final    NO GROWTH Performed at [facility], [place] [address], [place],  [address]    Report Status 04/24/2022 FINAL  Final  MRSA Next Gen by PCR, Nasal     Status: Abnormal   Collection Time: 04/23/22 11:55 AM   Specimen: Nasal Mucosa; Nasal Swab  Result Value Ref Range Status   MRSA by PCR Next Gen DETECTED (A) NOT DETECTED Final    Comment: RESULT CALLED TO, READ BACK BY AND VERIFIED WITH: JENNIFER KING @ 5329 ON 04/23/22 C VARNER (NOTE) The GeneXpert MRSA Assay (FDA approved for NASAL specimens only), is one component of a comprehensive MRSA colonization surveillance program. It is not intended to diagnose MRSA infection nor to guide or monitor treatment for MRSA infections. Test performance is not FDA approved in patients less than 65 years old. Performed at [facility], [address], [place],  [address]          Radiology Studies: DG HIPS BILAT WITH  PELVIS MIN 5 VIEWS  Result Date: 04/23/2022 CLINICAL DATA:  Bilateral hip pain. EXAM: DG HIP (WITH OR WITHOUT PELVIS) 5+V BILAT COMPARISON:  None Available. FINDINGS: Both hip joint spaces are preserved. There is bilateral acetabular spurring. Femoral heads are well seated. No fracture, erosion, or bony destruction. Intact pubic rami. Pubic symphysis and sacroiliac joints are congruent. Prominent vascular calcifications. IMPRESSION: Mild degenerative acetabular spurring of both hips. Electronically Signed   By: Keith Rake M.D.   On: 04/23/2022 18:42   US Venous Img Lower Unilateral Left (DVT)  Result Date: 04/23/2022 CLINICAL DATA:  61 year old  with left lower extremity swelling. EXAM: LEFT LOWER EXTREMITY VENOUS DOPPLER ULTRASOUND TECHNIQUE: Gray-scale sonography with graded compression, as well as color Doppler and duplex ultrasound were performed to evaluate the lower extremity deep venous systems from the level of the common femoral vein and including the common femoral, femoral, profunda femoral, popliteal and calf veins including the posterior tibial, peroneal and gastrocnemius veins when visible. The superficial great saphenous vein was also interrogated. Spectral Doppler was utilized to evaluate flow at rest and with distal augmentation maneuvers in the common femoral, femoral and popliteal veins. COMPARISON:  Venous ultrasound 11/23/2020 FINDINGS: Contralateral Common Femoral Vein: Respiratory phasicity is normal and symmetric with the symptomatic side. No evidence of thrombus. Normal compressibility. Common Femoral Vein: No evidence of thrombus. Normal compressibility, respiratory phasicity and response to augmentation. Saphenofemoral Junction: No evidence of thrombus. Normal compressibility and flow on color Doppler imaging. Profunda Femoral Vein: No evidence of thrombus. Normal compressibility and flow on color Doppler imaging. Femoral Vein: No evidence of thrombus. Normal compressibility,  respiratory phasicity and response to augmentation. Popliteal Vein: No evidence of thrombus. Normal compressibility, respiratory phasicity and response to augmentation. Calf Veins: Visualized left deep calf veins are patent without thrombus. Other Findings:  None. IMPRESSION: Negative for deep venous thrombosis in left lower extremity. Electronically Signed   By: Markus Daft M.D.   On: 04/23/2022 11:57        Scheduled Meds:  amitriptyline  25 mg Oral QHS   aspirin EC  81 mg Oral Q breakfast   heparin  5,000 Units Subcutaneous Q8H   insulin aspart  0-15 Units Subcutaneous TID WC   insulin aspart  0-5 Units Subcutaneous QHS   insulin aspart protamine- aspart  8 Units Subcutaneous BID WC   levothyroxine  75 mcg Oral Q0600   metoprolol succinate  12.5 mg Oral BID   mometasone-formoterol  2 puff Inhalation BID   mupirocin ointment   Nasal BID   pantoprazole  40 mg Oral Daily   tamsulosin  0.8 mg Oral Daily   venlafaxine XR  225 mg Oral Daily   Continuous Infusions:  cefTRIAXone (ROCEPHIN)  IV Stopped (04/24/22 1718)   vancomycin 1,250 mg (04/24/22 2143)          Aline August, MD [facility] 04/25/2022, 7:42 AM

## 2022-04-25 NOTE — Care Management Important Message (Signed)
Important Message  Patient Details  Name: Shane Chambers MRN: [id number] Date of Birth: 19-Jan-1961   Medicare Important Message Given:  Yes     Tommy Medal 04/25/2022, 2:16 PM

## 2022-04-25 NOTE — Progress Notes (Signed)
Physical Therapy Treatment Patient Details Name: Shane Chambers MRN: [id number] DOB: June 28, 1961 Today's Date: 04/25/2022   History of Present Illness Shane Chambers is a 61 y.o. male with medical history significant for diabetes mellitus, systolic and diastolic CHF, depression, CABG x5, COPD OSA, rectal cancer.  Patient was brought to the ED reports of bilateral lower extremity pain swelling and redness.  Patient tells me he has had chronic pain in his lower extremities but the pain was worse yesterday.  Also reports feeling ill.  Denies vomiting, no loose stools no abdominal pain, denies chest pain, no cough no difficulty breathing.  No neck pain or stiffness, denies urinary symptoms.  Reports pain in his buttock area, but he denies any ulcers involving his back or buttock area.    PT Comments    Pt sitting in chair and requested assistance back to bed.  Pt limited by pain Lt LE with decreased tolerance for weight bearing and gait.  Cueing for foot placement and hand placement on chair to assist with standing.  Mod-Max A with STS and mod A SPT back to bed.  RN in room to place IV in arm.  Pt left with call bell within reach.    Recommendations for follow up therapy are one component of a multi-disciplinary discharge planning process, led by the attending physician.  Recommendations may be updated based on patient status, additional functional criteria and insurance authorization.  Follow Up Recommendations        Assistance Recommended at Discharge    Patient can return home with the following     Equipment Recommendations       Recommendations for Other Services       Precautions / Restrictions Precautions Precautions: Fall Restrictions Weight Bearing Restrictions: No     Mobility  Bed Mobility               General bed mobility comments: Sitting in chair upon entrance    Transfers Overall transfer level: Needs assistance       Stand pivot transfers: Mod assist,  Max assist         General transfer comment: SPT from chair to bed    Ambulation/Gait   Gait Distance (Feet): 3 Feet Assistive device: Rolling walker (2 wheels)             Stairs             Wheelchair Mobility    Modified Rankin (Stroke Patients Only)       Balance                                            Cognition Arousal/Alertness: Awake/alert Behavior During Therapy: WFL for tasks assessed/performed Overall Cognitive Status: Within Functional Limits for tasks assessed                                          Exercises      General Comments        Pertinent Vitals/Pain Pain Assessment Pain Score: 7  Pain Location: Lt LE Pain Descriptors / Indicators: Guarding, Grimacing, Sore Pain Intervention(s): Monitored during session, Limited activity within patient's tolerance, Repositioned    Home Living  Prior Function            PT Goals (current goals can now be found in the care plan section)      Frequency           PT Plan Current plan remains appropriate    Co-evaluation              AM-PAC PT "6 Clicks" Mobility   Outcome Measure  Help needed turning from your back to your side while in a flat bed without using bedrails?: A Little Help needed moving from lying on your back to sitting on the side of a flat bed without using bedrails?: A Little Help needed moving to and from a bed to a chair (including a wheelchair)?: A Little Help needed standing up from a chair using your arms (e.g., wheelchair or bedside chair)?: A Little Help needed to walk in hospital room?: A Lot Help needed climbing 3-5 steps with a railing? : Total 6 Click Score: 15    End of Session Equipment Utilized During Treatment: Gait belt Activity Tolerance: Patient limited by fatigue;Patient limited by pain Patient left: with call bell/phone within reach;in bed;with nursing/sitter  in room Nurse Communication: Mobility status PT Visit Diagnosis: Unsteadiness on feet (R26.81);Other abnormalities of gait and mobility (R26.89);Muscle weakness (generalized) (M62.81)     Time: 9379-0240 PT Time Calculation (min) (ACUTE ONLY): 12 min  Charges:  $Therapeutic Activity: 8-22 mins                     Ihor Austin, LPTA/CLT; CBIS [phone]  Aldona Lento 04/25/2022, 6:41 PM

## 2022-04-25 NOTE — Consult Note (Signed)
[facility] Nurse Consult Note: Reason for Consult:Patient with bilateral LE erythema and edema. Negative Left LE venous doppler for DVT. Full and partial thickness wounds/bullae.See photo of LEs uploaded to EHR by Provider. Wound type: Infectious Pressure Injury POA: N/A Measurement:N/A   Wound IOM:BTDH thickness wounds and intact, serum filled bullae  Drainage (amount, consistency, odor) small to moderate amount serous to light yellow  Periwound: erythema, edema Dressing procedure/placement/frequency:Patient will have continued workup for infection source, he is on IV antibiotics. Topical care plan of care for bilateral LEs is with soap and water cleansing once daily followed by rinse and gently pat dry. Wounds are to be covered with folded layers of antimicrobial nonadherent gauze (xeroform)  topped with dry gauze, ABD pad for absorption and comfort and secured with a few turns of Kerlix roil gauze/paper tape. Feet are to be placed into Prevalon boots. Elevation of LEs on pillows while in bed.  Guidance for pressure injury prevention measures are provided for Nursing via the Orders and include placement of a sacral foam dressing and bilateral pressure redistribution heel boots that he will take with him back to the facility.   [facility] nursing team will not follow, but will remain available to this patient, the nursing and medical teams.  Please re-consult if needed.  Thank you for inviting [place] to participate in this patient's Plan of Care.  Maudie Flakes, MSN, RN, CNS, [place], Serita Grammes, [employer], [employer] phone:  [phone]

## 2022-04-26 DIAGNOSIS — R778 Other specified abnormalities of plasma proteins: Secondary | ICD-10-CM | POA: Diagnosis not present

## 2022-04-26 DIAGNOSIS — A419 Sepsis, unspecified organism: Secondary | ICD-10-CM | POA: Diagnosis not present

## 2022-04-26 DIAGNOSIS — L03116 Cellulitis of left lower limb: Secondary | ICD-10-CM | POA: Diagnosis not present

## 2022-04-26 DIAGNOSIS — N179 Acute kidney failure, unspecified: Secondary | ICD-10-CM | POA: Diagnosis not present

## 2022-04-26 LAB — CBC WITH DIFFERENTIAL/PLATELET
Abs Immature Granulocytes: 0.78 10*3/uL — ABNORMAL HIGH (ref 0.00–0.07)
Basophils Absolute: 0.1 10*3/uL (ref 0.0–0.1)
Basophils Relative: 1 %
Eosinophils Absolute: 0.4 10*3/uL (ref 0.0–0.5)
Eosinophils Relative: 3 %
HCT: 26.7 % — ABNORMAL LOW (ref 39.0–52.0)
Hemoglobin: 8.7 g/dL — ABNORMAL LOW (ref 13.0–17.0)
Immature Granulocytes: 5 %
Lymphocytes Relative: 6 %
Lymphs Abs: 0.9 10*3/uL (ref 0.7–4.0)
MCH: 29.6 pg (ref 26.0–34.0)
MCHC: 32.6 g/dL (ref 30.0–36.0)
MCV: 90.8 fL (ref 80.0–100.0)
Monocytes Absolute: 1.2 10*3/uL — ABNORMAL HIGH (ref 0.1–1.0)
Monocytes Relative: 7 %
Neutro Abs: 13 10*3/uL — ABNORMAL HIGH (ref 1.7–7.7)
Neutrophils Relative %: 78 %
Platelets: 287 10*3/uL (ref 150–400)
RBC: 2.94 MIL/uL — ABNORMAL LOW (ref 4.22–5.81)
RDW: 13.6 % (ref 11.5–15.5)
WBC: 16.4 10*3/uL — ABNORMAL HIGH (ref 4.0–10.5)
nRBC: 0 % (ref 0.0–0.2)

## 2022-04-26 LAB — GLUCOSE, CAPILLARY
Glucose-Capillary: 125 mg/dL — ABNORMAL HIGH (ref 70–99)
Glucose-Capillary: 169 mg/dL — ABNORMAL HIGH (ref 70–99)
Glucose-Capillary: 176 mg/dL — ABNORMAL HIGH (ref 70–99)
Glucose-Capillary: 174 mg/dL — ABNORMAL HIGH (ref 70–99)

## 2022-04-26 LAB — COMPREHENSIVE METABOLIC PANEL
ALT: 19 U/L (ref 0–44)
AST: 21 U/L (ref 15–41)
Albumin: 2.3 g/dL — ABNORMAL LOW (ref 3.5–5.0)
Alkaline Phosphatase: 132 U/L — ABNORMAL HIGH (ref 38–126)
Anion gap: 11 (ref 5–15)
BUN: 35 mg/dL — ABNORMAL HIGH (ref 6–20)
CO2: 22 mmol/L (ref 22–32)
Calcium: 7.9 mg/dL — ABNORMAL LOW (ref 8.9–10.3)
Chloride: 104 mmol/L (ref 98–111)
Creatinine, Ser: 1.23 mg/dL (ref 0.61–1.24)
GFR, Estimated: >60 mL/min (ref >60)
Glucose, Bld: 121 mg/dL — ABNORMAL HIGH (ref 70–99)
Potassium: 3.6 mmol/L (ref 3.5–5.1)
Sodium: 137 mmol/L (ref 135–145)
Total Bilirubin: 1 mg/dL (ref 0.3–1.2)
Total Protein: 6.3 g/dL — ABNORMAL LOW (ref 6.5–8.1)

## 2022-04-26 LAB — MAGNESIUM: Magnesium: 1.6 mg/dL — ABNORMAL LOW (ref 1.7–2.4)

## 2022-04-26 LAB — C-REACTIVE PROTEIN: CRP: 36.5 mg/dL — ABNORMAL HIGH (ref <1.0)

## 2022-04-26 MED ORDER — VANCOMYCIN HCL 1750 MG/350ML IV SOLN
1750.0000 mg | INTRAVENOUS | Status: DC
  Administered 2022-04-26 – 2022-04-28 (×3): 1750 mg via INTRAVENOUS
  Filled 2022-04-26 (×4): qty 350

## 2022-04-26 NOTE — Progress Notes (Signed)
PROGRESS NOTE    Shane Chambers  GBT:[id number] DOB: [birth year] DOA: 04/22/2022 PCP: Caprice Renshaw, MD   Brief Narrative:  61 y.o. male with medical history significant for diabetes mellitus, chronic systolic and diastolic CHF, depression, CABG x5, COPD, OSA, rectal cancer presented with bilateral lower extremity pain, swelling and redness.  On presentation, he was hypotensive with systolic blood pressure in the 80s with WBC of 13.9, lactic acid of 4.1 and subsequently 2.6; high-sensitivity troponin 110 and 116 subsequently and creatinine elevated at 2.45.  UA was not suggestive of UTI.  Chest x-ray showed no acute abnormality.  He was started on IV fluids and antibiotics.  Assessment & Plan:   Severe sepsis: Present on admission Bilateral lower leg cellulitis Lactic acidosis Leukocytosis -Presented with hypotension, leukocytosis, lactic acidosis, acute kidney injury secondary to possible bilateral lower extremity cellulitis.  UA and chest x-ray not suggestive of infection. -Blood pressure improving.  Blood cultures negative so far.  Continue broad-spectrum antibiotics.  No temperature spikes since admission -Left lower extremity venous Doppler was negative for DVT -WBCs improving to 16.4 today.  Bilateral lower extremity soft tissue ultrasound showed no organized fluid collection and was compatible with cellulitis -Wound care as per wound care RN recommendations  Acute kidney injury -Recent baseline of 1.3-1.6.  Presented with creatinine of 2.45.  Creatinine improving to 1.23 today.  Treated with IV fluids and discontinued on 04/24/2022.  Currently still appears volume overloaded: Continue IV Lasix.  Had good diuresis after Lasix was started on 04/25/2022.  Repeat a.m. labs.  Losartan and torsemide on hold.  Elevated troponin History of CAD/CABG -High-sensitivity troponins 110 and 116 subsequently on presentation.  Possibly from demand ischemia from sepsis and hypotension.  No chest pain; EKG  without any acute abnormalities.  No further work-up needed -Continue aspirin.  Beta-blocker resumed on 04/24/2022.  Blood pressures have improved.  Chronic systolic and diastolic CHF -Compensated.  Strict input and output.  Daily weights.  Last echo in 2021 had shown EF of 55 to 60% with grade 2 diastolic dysfunction. -Outpatient follow-up with PCP/cardiology.  Diuretic plan as above.  Hold losartan and torsemide.    Diabetes mellitus type 2 -Blood sugars stable.  Continue lower-dose of NPH  Hypertension -Patient was hypotensive on presentation.  Blood pressure improving.  Torsemide and losartan on hold.  Continue metoprolol.  Lasix plan as above.  BPH -Continue tamsulosin -Patient needed straight in and out cath this morning for retention.  Continue the same if patient has recurrence of retention.  Outpatient follow-up with urology  Depression -Continue amitriptyline and venlafaxine  Possible anemia of chronic disease -From chronic illnesses.  Hemoglobin currently stable  Hyponatremia -Improved  Hypomagnesemia -Replace.  Repeat a.m. labs  Hyperkalemia -Resolved  Rectal cancer -Follows with Dr. Delton Coombes.  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  Currently under surveillance.  Outpatient follow-up with oncology.  Morbid obesity -Outpatient follow-up  Physical deconditioning -Patient is from SNF and will have to return back to SNF.  PT following.  DVT prophylaxis: Heparin Code Status: Full Family Communication: None at bedside Disposition Plan: Status is: Inpatient Remains inpatient appropriate because: Of severity of illness.  Need for antibiotics and IV Lasix.   Consultants: None  Procedures: None  Antimicrobials: Rocephin and vancomycin from 04/22/2022 onwards   Subjective: Patient seen and examined at bedside.  Slow to respond, poor historian.  Legs are still swollen and painful but improving.  Still feels weak.  No overnight fever,  agitation, vomiting  or seizures reported.   Objective: Vitals:   04/25/22 1952 04/25/22 2204 04/26/22 0551 04/26/22 0749  BP: (!) 114/100 126/61 (!) 106/47   Pulse: 87 93 85   Resp: 20  20   Temp: (!) 96.8 F (36 C)  98.4 F (36.9 C)   TempSrc: Oral  Oral   SpO2: 96% 99% 96% 97%  Weight:   127 kg   Height:        Intake/Output Summary (Last 24 hours) at 04/26/2022 0810 Last data filed at 04/26/2022 0600 Gross per 24 hour  Intake 941.52 ml  Output 2250 ml  Net -1308.48 ml    Filed Weights   04/24/22 0500 04/25/22 0500 04/26/22 0551  Weight: 130.5 kg 127.5 kg 127 kg    Examination:  General: Currently on room air.  No distress.  Looks chronically ill and deconditioned. ENT/neck: No JVD elevation appreciated.  No palpable thyromegaly noted respiratory: Decreased breath sounds at bases bilaterally with some crackles CVS: S1 and S2 heard; rate is currently controlled abdominal: Soft, morbidly obese, nontender, still distended; no organomegaly, bowel sounds are heard extremities: Bilateral lower extremity are less swollen this morning; no clubbing CNS: Awake; slow to respond.  Poor historian.  No focal neurologic deficit.  Can move extremities  lymph: No obvious palpable lymphadenopathy noted  skin: Bilateral lower extremities are wrapped.  No obvious ecchymosis or other lesions present  psych: Extremely flat affect.  Currently not agitated musculoskeletal: No obvious other joint swelling/deformity     Data Reviewed: I have personally reviewed following labs and imaging studies  CBC: Recent Labs  Lab 04/22/22 1953 04/23/22 0403 04/24/22 0526 04/25/22 0455 04/26/22 0535  WBC 13.9* 14.5* 18.0* 20.5* 16.4*  NEUTROABS 10.7*  --  15.6* 17.7* 13.0*  HGB 9.6* 9.8* 9.7* 9.2* 8.7*  HCT 30.1* 31.3* 30.3* 29.0* 26.7*  MCV 92.9 93.7 93.2 91.8 90.8  PLT 216 210 231 252 518    Basic Metabolic Panel: Recent Labs  Lab 04/22/22 1953 04/23/22 0403 04/24/22 0526  04/25/22 0455 04/26/22 0535  NA 136 135 134* 135 137  K 5.2* 4.8 3.9 3.8 3.6  CL 102 103 104 104 104  CO2 26 21* 22 21* 22  GLUCOSE 120* 112* 131* 95 121*  BUN 58* 63* 54* 41* 35*  CREATININE 2.45* 2.46* 1.84* 1.40* 1.23  CALCIUM 7.4* 7.7* 7.6* 7.7* 7.9*  MG  --   --  1.3* 1.3* 1.6*    GFR: Estimated Creatinine Clearance: 85.2 mL/min (by C-G formula based on SCr of 1.23 mg/dL). Liver Function Tests: Recent Labs  Lab 04/22/22 1953 04/24/22 0526 04/26/22 0535  AST 64* 46* 21  ALT '24 25 19  '$ ALKPHOS 71 97 132*  BILITOT 1.1 0.7 1.0  PROT 5.8* 6.4* 6.3*  ALBUMIN 2.6* 2.7* 2.3*    No results for input(s): "LIPASE", "AMYLASE" in the last 168 hours. No results for input(s): "AMMONIA" in the last 168 hours. Coagulation Profile: Recent Labs  Lab 04/22/22 1953  INR 1.6*    Cardiac Enzymes: No results for input(s): "CKTOTAL", "CKMB", "CKMBINDEX", "TROPONINI" in the last 168 hours. BNP (last 3 results) No results for input(s): "PROBNP" in the last 8760 hours. HbA1C: No results for input(s): "HGBA1C" in the last 72 hours. CBG: Recent Labs  Lab 04/25/22 0726 04/25/22 1152 04/25/22 1622 04/25/22 2202 04/26/22 0743  GLUCAP 94 159* 129* 159* 125*    Lipid Profile: No results for input(s): "CHOL", "HDL", "LDLCALC", "TRIG", "CHOLHDL", "LDLDIRECT" in the last 72 hours. Thyroid  Function Tests: No results for input(s): "TSH", "T4TOTAL", "FREET4", "T3FREE", "THYROIDAB" in the last 72 hours. Anemia Panel: No results for input(s): "VITAMINB12", "FOLATE", "FERRITIN", "TIBC", "IRON", "RETICCTPCT" in the last 72 hours. Sepsis Labs: Recent Labs  Lab 04/22/22 1637 04/22/22 1953  LATICACIDVEN 4.1* 2.6*     Recent Results (from the past 240 hour(s))  Culture, blood (Routine x 2)     Status: None (Preliminary result)   Collection Time: 04/22/22  4:37 PM   Specimen: Right Antecubital; Blood  Result Value Ref Range Status   Specimen Description RIGHT ANTECUBITAL  Final    Special Requests   Final    BOTTLES DRAWN AEROBIC AND ANAEROBIC Blood Culture results may not be optimal due to an inadequate volume of blood received in culture bottles   Culture   Final    NO GROWTH 4 DAYS Performed at [facility], [address], [place], [place] [address]    Report Status PENDING  Incomplete  Culture, blood (Routine x 2)     Status: None (Preliminary result)   Collection Time: 04/22/22  4:37 PM   Specimen: Left Antecubital; Blood  Result Value Ref Range Status   Specimen Description LEFT ANTECUBITAL  Final   Special Requests   Final    BOTTLES DRAWN AEROBIC AND ANAEROBIC Blood Culture adequate volume   Culture   Final    NO GROWTH 4 DAYS Performed at [facility], [address], [place], [place] [address]    Report Status PENDING  Incomplete  Urine Culture     Status: None   Collection Time: 04/22/22  6:46 PM   Specimen: Urine, Clean Catch  Result Value Ref Range Status   Specimen Description   Final    URINE, CLEAN CATCH Performed at [facility], [address], [place], [place] [address]    Special Requests   Final    NONE Performed at [facility], [address] [place], [place] [address]    Culture   Final    NO GROWTH Performed at [facility], [place] [address] [place], [place] [address]    Report Status 04/24/2022 FINAL  Final  MRSA Next Gen by PCR, Nasal     Status: Abnormal   Collection Time: 04/23/22 11:55 AM   Specimen: Nasal Mucosa; Nasal Swab  Result Value Ref Range Status   MRSA by PCR Next Gen DETECTED (A) NOT DETECTED Final    Comment: RESULT CALLED TO, READ BACK BY AND VERIFIED WITH: JENNIFER KING @ 9242 ON 04/23/22 C VARNER (NOTE) The GeneXpert MRSA Assay (FDA approved for NASAL specimens only), is one component of a comprehensive MRSA colonization surveillance program. It is not intended to diagnose MRSA infection nor to guide or monitor treatment for MRSA infections. Test performance is not FDA approved in patients less than  60 years old. Performed at [facility], [address] [place], [place] [address]          Radiology Studies: [place] LT LOWER EXTREM LTD SOFT TISSUE NON VASCULAR  Result Date: 04/25/2022 CLINICAL DATA:  Bilateral soft tissue ultrasound EXAM: ULTRASOUND bilateral LOWER EXTREMITY LIMITED TECHNIQUE: Ultrasound examination of the lower extremity soft tissues was performed in the area of clinical concern. COMPARISON:  None Available. FINDINGS: Targeted ultrasound was performed of the bilateral anterior lower legs and left medial dorsal foot. Significant soft tissue edema is seen with no evidence of organized fluid collection. IMPRESSION: Soft tissue edema, compatible with history of cellulitis. No evidence of organized fluid collection.  Electronically Signed   By: Yetta Glassman M.D.   On: 04/25/2022 11:48   [place] RT LOWER EXTREM LTD SOFT TISSUE NON VASCULAR  Result Date: 04/25/2022 CLINICAL DATA:  Bilateral soft tissue ultrasound EXAM: ULTRASOUND bilateral LOWER EXTREMITY LIMITED TECHNIQUE: Ultrasound examination of the lower extremity soft tissues was performed in the area of clinical concern. COMPARISON:  None Available. FINDINGS: Targeted ultrasound was performed of the bilateral anterior lower legs and left medial dorsal foot. Significant soft tissue edema is seen with no evidence of organized fluid collection. IMPRESSION: Soft tissue edema, compatible with history of cellulitis. No evidence of organized fluid collection. Electronically Signed   By: Yetta Glassman M.D.   On: 04/25/2022 11:48        Scheduled Meds:  amitriptyline  25 mg Oral QHS   aspirin EC  81 mg Oral Q breakfast   furosemide  60 mg Intravenous Q12H   gabapentin  600 mg Oral BID   heparin  5,000 Units Subcutaneous Q8H   insulin aspart  0-15 Units Subcutaneous TID WC   insulin aspart  0-5 Units Subcutaneous QHS   insulin aspart protamine- aspart  8 Units Subcutaneous BID WC   levothyroxine  75 mcg Oral Q0600    metoprolol succinate  12.5 mg Oral BID   mometasone-formoterol  2 puff Inhalation BID   mupirocin ointment   Nasal BID   pantoprazole  40 mg Oral Daily   tamsulosin  0.8 mg Oral Daily   venlafaxine XR  225 mg Oral Daily   Continuous Infusions:  cefTRIAXone (ROCEPHIN)  IV Stopped (04/25/22 1702)   vancomycin Stopped (04/25/22 2147)          Aline August, MD [facility] 04/26/2022, 8:10 AM

## 2022-04-26 NOTE — Progress Notes (Signed)
Pharmacy Antibiotic Note  Shane Chambers is a 61 y.o. male admitted on 04/22/2022 with sepsis possibly from BLE cellulitis .  Pharmacy has been consulted for vancomycin dosing. Patient is also on ceftriaxone.   6/13 Vancomycin 1000 mg Q 24 hr Scr used: 2.45 mg/dL Weight: 125.6 kg Vd coeff: 0.5 L/kg Est AUC: 508  6/15:  Vancomycin dose was adjusted to '1250mg'$  IV q24hrs due to improved renal fxn.  6/17:  Vancomycin dose will be adjusted again today due to improved renal fxn.  Height: '5\' 9"'$  (175.3 cm) Weight: 127 kg (279 lb 15.8 oz) IBW/kg (Calculated) : 70.7  Temp (24hrs), Avg:97.6 F (36.4 C), Min:96.8 F (36 C), Max:98.4 F (36.9 C)  Recent Labs  Lab 04/22/22 1637 04/22/22 1953 04/23/22 0403 04/24/22 0526 04/25/22 0455 04/26/22 0535  WBC 17.9* 13.9* 14.5* 18.0* 20.5* 16.4*  CREATININE  --  2.45* 2.46* 1.84* 1.40* 1.23  LATICACIDVEN 4.1* 2.6*  --   --   --   --      Estimated Creatinine Clearance: 85.2 mL/min (by C-G formula based on SCr of 1.23 mg/dL).    Allergies  Allergen Reactions   Ace Inhibitors Swelling and Cough    (Not on MAR at [facility] and [place])   Other Itching    [name]   Penicillins     Has patient had a PCN reaction causing immediate rash, facial/tongue/throat swelling, SOB or lightheadedness with hypotension: Unknown Has patient had a PCN reaction causing severe rash involving mucus membranes or skin necrosis: Unknown Has patient had a PCN reaction that required hospitalization: Unknown Has patient had a PCN reaction occurring within the last 10 years: Unknown If all of the above answers are "NO", then may proceed with Cephalosporin use.     Antimicrobials this admission: Vanc 6/13 >>  CTX 6/13 >>   Dose adjustments this admission: N/a  Microbiology results: 6/13 BCx: pend  Thank you for allowing pharmacy to be a part of this patient's care.  Plan: Increase Vancomycin to '1750mg'$  IV q24hrs Scr used: 1.23  mg/dL Weight: 127 kg Vd coeff: 0.5 L/kg Est AUC: 474.6 Calculated Css min 10.2 Monitor cultures, clinical status, renal function, vancomycin level Narrow abx as able and f/u duration   Hart Robinsons, PharmD Clinical Pharmacist   Please check AMION for all [place] phone numbers After 10:00 PM, call [place] [phone]

## 2022-04-26 NOTE — Progress Notes (Signed)
Patient has had multiple iv sticks since admission.  Patient is requesting a PICC line.

## 2022-04-27 DIAGNOSIS — I25110 Atherosclerotic heart disease of native coronary artery with unstable angina pectoris: Secondary | ICD-10-CM

## 2022-04-27 DIAGNOSIS — L03116 Cellulitis of left lower limb: Secondary | ICD-10-CM | POA: Diagnosis not present

## 2022-04-27 DIAGNOSIS — N179 Acute kidney failure, unspecified: Secondary | ICD-10-CM | POA: Diagnosis not present

## 2022-04-27 DIAGNOSIS — A419 Sepsis, unspecified organism: Secondary | ICD-10-CM | POA: Diagnosis not present

## 2022-04-27 LAB — CBC WITH DIFFERENTIAL/PLATELET
Abs Immature Granulocytes: 0.9 10*3/uL — ABNORMAL HIGH (ref 0.00–0.07)
Band Neutrophils: 6 %
Basophils Absolute: 0 10*3/uL (ref 0.0–0.1)
Basophils Relative: 0 %
Eosinophils Absolute: 0 10*3/uL (ref 0.0–0.5)
Eosinophils Relative: 0 %
HCT: 26.1 % — ABNORMAL LOW (ref 39.0–52.0)
Hemoglobin: 8.6 g/dL — ABNORMAL LOW (ref 13.0–17.0)
Lymphocytes Relative: 9 %
Lymphs Abs: 1.4 10*3/uL (ref 0.7–4.0)
MCH: 29.6 pg (ref 26.0–34.0)
MCHC: 33 g/dL (ref 30.0–36.0)
MCV: 89.7 fL (ref 80.0–100.0)
Metamyelocytes Relative: 3 %
Monocytes Absolute: 0.8 10*3/uL (ref 0.1–1.0)
Monocytes Relative: 5 %
Myelocytes: 2 %
Neutro Abs: 12.6 10*3/uL — ABNORMAL HIGH (ref 1.7–7.7)
Neutrophils Relative %: 74 %
Platelets: 320 10*3/uL (ref 150–400)
Promyelocytes Relative: 1 %
RBC: 2.91 MIL/uL — ABNORMAL LOW (ref 4.22–5.81)
RDW: 13.6 % (ref 11.5–15.5)
WBC: 15.8 10*3/uL — ABNORMAL HIGH (ref 4.0–10.5)
nRBC: 0 % (ref 0.0–0.2)

## 2022-04-27 LAB — BASIC METABOLIC PANEL
Anion gap: 8 (ref 5–15)
BUN: 29 mg/dL — ABNORMAL HIGH (ref 6–20)
CO2: 23 mmol/L (ref 22–32)
Calcium: 8 mg/dL — ABNORMAL LOW (ref 8.9–10.3)
Chloride: 104 mmol/L (ref 98–111)
Creatinine, Ser: 1.18 mg/dL (ref 0.61–1.24)
GFR, Estimated: >60 mL/min (ref >60)
Glucose, Bld: 138 mg/dL — ABNORMAL HIGH (ref 70–99)
Potassium: 3.4 mmol/L — ABNORMAL LOW (ref 3.5–5.1)
Sodium: 135 mmol/L (ref 135–145)

## 2022-04-27 LAB — CULTURE, BLOOD (ROUTINE X 2)
Culture: NO GROWTH
Culture: NO GROWTH
Special Requests: ADEQUATE

## 2022-04-27 LAB — GLUCOSE, CAPILLARY
Glucose-Capillary: 219 mg/dL — ABNORMAL HIGH (ref 70–99)
Glucose-Capillary: 182 mg/dL — ABNORMAL HIGH (ref 70–99)
Glucose-Capillary: 212 mg/dL — ABNORMAL HIGH (ref 70–99)

## 2022-04-27 LAB — MAGNESIUM: Magnesium: 1.4 mg/dL — ABNORMAL LOW (ref 1.7–2.4)

## 2022-04-27 MED ORDER — FUROSEMIDE 10 MG/ML IJ SOLN
80.0000 mg | Freq: Two times a day (BID) | INTRAMUSCULAR | Status: DC
Start: 2022-04-27 — End: 2022-05-02
  Administered 2022-04-27 – 2022-05-02 (×11): 80 mg via INTRAVENOUS
  Filled 2022-04-27 (×11): qty 8

## 2022-04-27 MED ORDER — MAGNESIUM SULFATE 2 GM/50ML IV SOLN
2.0000 g | Freq: Once | INTRAVENOUS | Status: AC
  Administered 2022-04-27: 2 g via INTRAVENOUS
  Filled 2022-04-27: qty 50

## 2022-04-27 MED ORDER — POTASSIUM CHLORIDE CRYS ER 20 MEQ PO TBCR
40.0000 meq | EXTENDED_RELEASE_TABLET | Freq: Once | ORAL | Status: AC
  Administered 2022-04-27: 40 meq via ORAL
  Filled 2022-04-27: qty 2

## 2022-04-27 NOTE — Progress Notes (Signed)
PROGRESS NOTE    Shane Chambers  VCB:[id number] DOB: Oct 14, 1961 DOA: 04/22/2022 PCP: Caprice Renshaw, MD   Brief Narrative:  61 y.o. male with medical history significant for diabetes mellitus, chronic systolic and diastolic CHF, depression, CABG x5, COPD, OSA, rectal cancer presented with bilateral lower extremity pain, swelling and redness.  On presentation, he was hypotensive with systolic blood pressure in the 80s with WBC of 13.9, lactic acid of 4.1 and subsequently 2.6; high-sensitivity troponin 110 and 116 subsequently and creatinine elevated at 2.45.  UA was not suggestive of UTI.  Chest x-ray showed no acute abnormality.  He was started on IV fluids and antibiotics.  Assessment & Plan:   Severe sepsis: Present on admission Bilateral lower leg cellulitis Lactic acidosis Leukocytosis -Presented with hypotension, leukocytosis, lactic acidosis, acute kidney injury secondary to possible bilateral lower extremity cellulitis.  UA and chest x-ray not suggestive of infection. -Blood pressure improving.  Blood cultures negative so far.  Continue broad-spectrum antibiotics.  No temperature spikes since admission -Left lower extremity venous Doppler was negative for DVT -WBCs improving to 15.8 today.  Bilateral lower extremity soft tissue ultrasound showed no organized fluid collection and was compatible with cellulitis -Wound care as per wound care RN recommendations  Acute kidney injury -Recent baseline of 1.3-1.6.  Presented with creatinine of 2.45.  Creatinine improving to 1.18 today.  Treated with IV fluids and discontinued on 04/24/2022.  Currently still appears volume overloaded: Increase IV Lasix to 80 mg every 12 hours. Repeat a.m. labs.  Losartan and torsemide on hold.  Elevated troponin History of CAD/CABG -High-sensitivity troponins 110 and 116 subsequently on presentation.  Possibly from demand ischemia from sepsis and hypotension.  No chest pain; EKG without any acute abnormalities.   No further work-up needed -Continue aspirin.  Beta-blocker resumed on 04/24/2022.  Blood pressures have improved.  Acute on chronic systolic and diastolic CHF -Compensated.  Strict input and output.  Daily weights.  Last echo in 2021 had shown EF of 55 to 60% with grade 2 diastolic dysfunction. -Outpatient follow-up with PCP/cardiology.  Diuretic plan as above.  Hold losartan and torsemide.    Diabetes mellitus type 2 -Blood sugars stable.  Continue lower-dose of NPH  Hypertension -Patient was hypotensive on presentation.  Blood pressure improving.  Torsemide and losartan on hold.  Continue metoprolol.  Lasix plan as above.  BPH -Continue tamsulosin -Patient needed straight in and out cath this morning for retention.  Continue the same if patient has recurrence of retention.  Outpatient follow-up with urology  Depression -Continue amitriptyline and venlafaxine  Possible anemia of chronic disease -From chronic illnesses.  Hemoglobin currently stable  Hyponatremia -Improved  Hypomagnesemia -Replace.  Repeat a.m. labs  Hypokalemia -Replace.  Repeat a.m. labs  Rectal cancer -Follows with Dr. Delton Coombes.  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  Currently under surveillance.  Outpatient follow-up with oncology.  Morbid obesity -Outpatient follow-up  Physical deconditioning -Patient is from SNF and will have to return back to SNF.  PT following.  DVT prophylaxis: Heparin Code Status: Full Family Communication: None at bedside Disposition Plan: Status is: Inpatient Remains inpatient appropriate because: Of severity of illness.  Need for antibiotics and IV Lasix.  Possible discharge in 1 to 3 days pending clinical improvement   Consultants: None  Procedures: None  Antimicrobials: Rocephin and vancomycin from 04/22/2022 onwards   Subjective: Patient seen and examined at bedside.  Poor historian.  No overnight fever, seizures, vomiting reported.   Doesn't feel  better but doesn't elaborate on it. Objective: Vitals:   04/26/22 1946 04/26/22 2100 04/27/22 0442 04/27/22 0500  BP:  129/61 120/61   Pulse: 84 82 81   Resp: '16 20 18   '$ Temp:  99 F (37.2 C) 97.6 F (36.4 C)   TempSrc:  Oral Oral   SpO2: 97% 95% 95%   Weight:    127.3 kg  Height:        Intake/Output Summary (Last 24 hours) at 04/27/2022 0803 Last data filed at 04/27/2022 0100 Gross per 24 hour  Intake 1497.88 ml  Output 2650 ml  Net -1152.12 ml    Filed Weights   04/25/22 0500 04/26/22 0551 04/27/22 0500  Weight: 127.5 kg 127 kg 127.3 kg    Examination:  General: No acute distress.  Still on room air.  Looks chronically ill and deconditioned. ENT/neck: No obvious thyromegaly or JVD elevation noted.  No neck masses  respiratory: Bilateral decreased breath sounds bases with bibasilar crackles, no wheezing  CVS: Currently rate controlled; S1 and S2 are heard  abdominal: Soft, morbidly obese, nontender, distended mildly; no organomegaly, normal bowel sounds heard extremities: Lower extremity edema present bilaterally; no cyanosis  CNS: Alert; still slow to respond.  Poor historian.  No focal neurologic deficit.  Able to move extremities.   Lymph: No cervical lymphadenopathy noted  skin: Bilateral lower extremities are wrapped.  No obvious petechiae/other rashes present  psych: No signs of agitation; flat affect  musculoskeletal: No obvious other joint swelling/deformity     Data Reviewed: I have personally reviewed following labs and imaging studies  CBC: Recent Labs  Lab 04/22/22 1953 04/23/22 0403 04/24/22 0526 04/25/22 0455 04/26/22 0535 04/27/22 0605  WBC 13.9* 14.5* 18.0* 20.5* 16.4* 15.8*  NEUTROABS 10.7*  --  15.6* 17.7* 13.0* 12.6*  HGB 9.6* 9.8* 9.7* 9.2* 8.7* 8.6*  HCT 30.1* 31.3* 30.3* 29.0* 26.7* 26.1*  MCV 92.9 93.7 93.2 91.8 90.8 89.7  PLT 216 210 231 252 287 388    Basic Metabolic Panel: Recent Labs  Lab 04/23/22 0403  04/24/22 0526 04/25/22 0455 04/26/22 0535 04/27/22 0605  NA 135 134* 135 137 135  K 4.8 3.9 3.8 3.6 3.4*  CL 103 104 104 104 104  CO2 21* 22 21* 22 23  GLUCOSE 112* 131* 95 121* 138*  BUN 63* 54* 41* 35* 29*  CREATININE 2.46* 1.84* 1.40* 1.23 1.18  CALCIUM 7.7* 7.6* 7.7* 7.9* 8.0*  MG  --  1.3* 1.3* 1.6* 1.4*    GFR: Estimated Creatinine Clearance: 89 mL/min (by C-G formula based on SCr of 1.18 mg/dL). Liver Function Tests: Recent Labs  Lab 04/22/22 1953 04/24/22 0526 04/26/22 0535  AST 64* 46* 21  ALT '24 25 19  '$ ALKPHOS 71 97 132*  BILITOT 1.1 0.7 1.0  PROT 5.8* 6.4* 6.3*  ALBUMIN 2.6* 2.7* 2.3*    No results for input(s): "LIPASE", "AMYLASE" in the last 168 hours. No results for input(s): "AMMONIA" in the last 168 hours. Coagulation Profile: Recent Labs  Lab 04/22/22 1953  INR 1.6*    Cardiac Enzymes: No results for input(s): "CKTOTAL", "CKMB", "CKMBINDEX", "TROPONINI" in the last 168 hours. BNP (last 3 results) No results for input(s): "PROBNP" in the last 8760 hours. HbA1C: No results for input(s): "HGBA1C" in the last 72 hours. CBG: Recent Labs  Lab 04/25/22 2202 04/26/22 0743 04/26/22 1110 04/26/22 1653 04/26/22 2233  GLUCAP 159* 125* 169* 176* 174*    Lipid Profile: No results for input(s): "CHOL", "HDL", "LDLCALC", "  TRIG", "CHOLHDL", "LDLDIRECT" in the last 72 hours. Thyroid Function Tests: No results for input(s): "TSH", "T4TOTAL", "FREET4", "T3FREE", "THYROIDAB" in the last 72 hours. Anemia Panel: No results for input(s): "VITAMINB12", "FOLATE", "FERRITIN", "TIBC", "IRON", "RETICCTPCT" in the last 72 hours. Sepsis Labs: Recent Labs  Lab 04/22/22 1637 04/22/22 1953  LATICACIDVEN 4.1* 2.6*     Recent Results (from the past 240 hour(s))  Culture, blood (Routine x 2)     Status: None (Preliminary result)   Collection Time: 04/22/22  4:37 PM   Specimen: Right Antecubital; Blood  Result Value Ref Range Status   Specimen Description  RIGHT ANTECUBITAL  Final   Special Requests   Final    BOTTLES DRAWN AEROBIC AND ANAEROBIC Blood Culture results may not be optimal due to an inadequate volume of blood received in culture bottles   Culture   Final    NO GROWTH 4 DAYS Performed at [facility], [address], [place], [place] [address]    Report Status PENDING  Incomplete  Culture, blood (Routine x 2)     Status: None (Preliminary result)   Collection Time: 04/22/22  4:37 PM   Specimen: Left Antecubital; Blood  Result Value Ref Range Status   Specimen Description LEFT ANTECUBITAL  Final   Special Requests   Final    BOTTLES DRAWN AEROBIC AND ANAEROBIC Blood Culture adequate volume   Culture   Final    NO GROWTH 4 DAYS Performed at [facility], [address] [place], [place] [address]    Report Status PENDING  Incomplete  Urine Culture     Status: None   Collection Time: 04/22/22  6:46 PM   Specimen: Urine, Clean Catch  Result Value Ref Range Status   Specimen Description   Final    URINE, CLEAN CATCH Performed at [facility], [address] [place], [place] [address]    Special Requests   Final    NONE Performed at [facility], [address], [place], [place] [address]    Culture   Final    NO GROWTH Performed at [facility], [place] [address] [place], [place] [address]    Report Status 04/24/2022 FINAL  Final  MRSA Next Gen by PCR, Nasal     Status: Abnormal   Collection Time: 04/23/22 11:55 AM   Specimen: Nasal Mucosa; Nasal Swab  Result Value Ref Range Status   MRSA by PCR Next Gen DETECTED (A) NOT DETECTED Final    Comment: RESULT CALLED TO, READ BACK BY AND VERIFIED WITH: JENNIFER KING @ 7782 ON 04/23/22 C VARNER (NOTE) The GeneXpert MRSA Assay (FDA approved for NASAL specimens only), is one component of a comprehensive MRSA colonization surveillance program. It is not intended to diagnose MRSA infection nor to guide or monitor treatment for MRSA infections. Test performance is not FDA  approved in patients less than 61 years old. Performed at [facility], [address] [place], [place] [address]          Radiology Studies: [place] LT LOWER EXTREM LTD SOFT TISSUE NON VASCULAR  Result Date: 04/25/2022 CLINICAL DATA:  Bilateral soft tissue ultrasound EXAM: ULTRASOUND bilateral LOWER EXTREMITY LIMITED TECHNIQUE: Ultrasound examination of the lower extremity soft tissues was performed in the area of clinical concern. COMPARISON:  None Available. FINDINGS: Targeted ultrasound was performed of the bilateral anterior lower legs and left medial dorsal foot. Significant soft tissue edema is seen with no evidence of organized fluid collection. IMPRESSION: Soft tissue edema, compatible with  history of cellulitis. No evidence of organized fluid collection. Electronically Signed   By: Yetta Glassman M.D.   On: 04/25/2022 11:48   [place] RT LOWER EXTREM LTD SOFT TISSUE NON VASCULAR  Result Date: 04/25/2022 CLINICAL DATA:  Bilateral soft tissue ultrasound EXAM: ULTRASOUND bilateral LOWER EXTREMITY LIMITED TECHNIQUE: Ultrasound examination of the lower extremity soft tissues was performed in the area of clinical concern. COMPARISON:  None Available. FINDINGS: Targeted ultrasound was performed of the bilateral anterior lower legs and left medial dorsal foot. Significant soft tissue edema is seen with no evidence of organized fluid collection. IMPRESSION: Soft tissue edema, compatible with history of cellulitis. No evidence of organized fluid collection. Electronically Signed   By: Yetta Glassman M.D.   On: 04/25/2022 11:48        Scheduled Meds:  amitriptyline  25 mg Oral QHS   aspirin EC  81 mg Oral Q breakfast   furosemide  60 mg Intravenous Q12H   gabapentin  600 mg Oral BID   heparin  5,000 Units Subcutaneous Q8H   insulin aspart  0-15 Units Subcutaneous TID WC   insulin aspart  0-5 Units Subcutaneous QHS   insulin aspart protamine- aspart  8 Units Subcutaneous BID WC   levothyroxine   75 mcg Oral Q0600   metoprolol succinate  12.5 mg Oral BID   mometasone-formoterol  2 puff Inhalation BID   mupirocin ointment   Nasal BID   pantoprazole  40 mg Oral Daily   tamsulosin  0.8 mg Oral Daily   venlafaxine XR  225 mg Oral Daily   Continuous Infusions:  cefTRIAXone (ROCEPHIN)  IV 2 g (04/26/22 1839)   vancomycin 1,750 mg (04/26/22 1846)          Aline August, MD [facility] 04/27/2022, 8:03 AM

## 2022-04-27 NOTE — Progress Notes (Signed)
Wound care performed to left lower leg. Old dsg removed, skin cleaned with soap and water, patted dry. Xeroform/vaseline gauze applied over open areas, covered with abd pads for absorbency (open blisters draining moderate amount serosanguinous drainage), secured with kerlix. Pt tolerated well.  Pt then assisted up OOB to recliner. Pt able to bear wt on both feet using FWW and 2 assist to stand and pivot to recliner. Pt also tolerated this well.  Denies c/o or needs at this time.

## 2022-04-28 DIAGNOSIS — D638 Anemia in other chronic diseases classified elsewhere: Secondary | ICD-10-CM | POA: Diagnosis not present

## 2022-04-28 DIAGNOSIS — A419 Sepsis, unspecified organism: Secondary | ICD-10-CM | POA: Diagnosis not present

## 2022-04-28 DIAGNOSIS — L03116 Cellulitis of left lower limb: Secondary | ICD-10-CM | POA: Diagnosis not present

## 2022-04-28 DIAGNOSIS — N179 Acute kidney failure, unspecified: Secondary | ICD-10-CM | POA: Diagnosis not present

## 2022-04-28 LAB — BASIC METABOLIC PANEL
Anion gap: 8 (ref 5–15)
BUN: 27 mg/dL — ABNORMAL HIGH (ref 6–20)
CO2: 24 mmol/L (ref 22–32)
Calcium: 8 mg/dL — ABNORMAL LOW (ref 8.9–10.3)
Chloride: 104 mmol/L (ref 98–111)
Creatinine, Ser: 1.22 mg/dL (ref 0.61–1.24)
GFR, Estimated: >60 mL/min (ref >60)
Glucose, Bld: 138 mg/dL — ABNORMAL HIGH (ref 70–99)
Potassium: 3.4 mmol/L — ABNORMAL LOW (ref 3.5–5.1)
Sodium: 136 mmol/L (ref 135–145)

## 2022-04-28 LAB — GLUCOSE, CAPILLARY
Glucose-Capillary: 125 mg/dL — ABNORMAL HIGH (ref 70–99)
Glucose-Capillary: 129 mg/dL — ABNORMAL HIGH (ref 70–99)
Glucose-Capillary: 141 mg/dL — ABNORMAL HIGH (ref 70–99)
Glucose-Capillary: 196 mg/dL — ABNORMAL HIGH (ref 70–99)
Glucose-Capillary: 167 mg/dL — ABNORMAL HIGH (ref 70–99)

## 2022-04-28 LAB — CBC WITH DIFFERENTIAL/PLATELET
Abs Immature Granulocytes: 1.6 10*3/uL — ABNORMAL HIGH (ref 0.00–0.07)
Basophils Absolute: 0.1 10*3/uL (ref 0.0–0.1)
Basophils Relative: 1 %
Eosinophils Absolute: 0.4 10*3/uL (ref 0.0–0.5)
Eosinophils Relative: 2 %
HCT: 25.1 % — ABNORMAL LOW (ref 39.0–52.0)
Hemoglobin: 8.2 g/dL — ABNORMAL LOW (ref 13.0–17.0)
Immature Granulocytes: 9 %
Lymphocytes Relative: 8 %
Lymphs Abs: 1.3 10*3/uL (ref 0.7–4.0)
MCH: 29.5 pg (ref 26.0–34.0)
MCHC: 32.7 g/dL (ref 30.0–36.0)
MCV: 90.3 fL (ref 80.0–100.0)
Monocytes Absolute: 1.8 10*3/uL — ABNORMAL HIGH (ref 0.1–1.0)
Monocytes Relative: 10 %
Neutro Abs: 12.1 10*3/uL — ABNORMAL HIGH (ref 1.7–7.7)
Neutrophils Relative %: 70 %
Platelets: 385 10*3/uL (ref 150–400)
RBC: 2.78 MIL/uL — ABNORMAL LOW (ref 4.22–5.81)
RDW: 13.8 % (ref 11.5–15.5)
WBC: 17.3 10*3/uL — ABNORMAL HIGH (ref 4.0–10.5)
nRBC: 0 % (ref 0.0–0.2)

## 2022-04-28 LAB — C-REACTIVE PROTEIN: CRP: 33.6 mg/dL — ABNORMAL HIGH (ref <1.0)

## 2022-04-28 LAB — MAGNESIUM: Magnesium: 1.6 mg/dL — ABNORMAL LOW (ref 1.7–2.4)

## 2022-04-28 MED ORDER — POTASSIUM CHLORIDE CRYS ER 20 MEQ PO TBCR
40.0000 meq | EXTENDED_RELEASE_TABLET | Freq: Two times a day (BID) | ORAL | Status: DC
  Administered 2022-04-28 – 2022-05-02 (×9): 40 meq via ORAL
  Filled 2022-04-28 (×9): qty 2

## 2022-04-28 MED ORDER — MAGNESIUM SULFATE 2 GM/50ML IV SOLN
2.0000 g | Freq: Once | INTRAVENOUS | Status: AC
  Administered 2022-04-28: 2 g via INTRAVENOUS
  Filled 2022-04-28: qty 50

## 2022-04-28 NOTE — Progress Notes (Signed)
PROGRESS NOTE    Shane Chambers  FIE:[id number] DOB: May 20, 1961 DOA: 04/22/2022 PCP: Caprice Renshaw, MD   Brief Narrative:  61 y.o. male with medical history significant for diabetes mellitus, chronic systolic and diastolic CHF, depression, CABG x5, COPD, OSA, rectal cancer presented with bilateral lower extremity pain, swelling and redness.  On presentation, he was hypotensive with systolic blood pressure in the 80s with WBC of 13.9, lactic acid of 4.1 and subsequently 2.6; high-sensitivity troponin 110 and 116 subsequently and creatinine elevated at 2.45.  UA was not suggestive of UTI.  Chest x-ray showed no acute abnormality.  He was started on IV fluids and antibiotics.  Assessment & Plan:   Severe sepsis: Present on admission Bilateral lower leg cellulitis Lactic acidosis Leukocytosis -Presented with hypotension, leukocytosis, lactic acidosis, acute kidney injury secondary to possible bilateral lower extremity cellulitis.  UA and chest x-ray not suggestive of infection. -Blood pressure improving.  Blood cultures negative so far.  Continue broad-spectrum antibiotics, today is day #7.  We will switch to oral antibiotics from tomorrow.  No temperature spikes since admission -Left lower extremity venous Doppler was negative for DVT -WBCs worsened to 17.3 today.  Bilateral lower extremity soft tissue ultrasound showed no organized fluid collection and was compatible with cellulitis -Wound care as per wound care RN recommendations  Acute kidney injury -Recent baseline of 1.3-1.6.  Presented with creatinine of 2.45.  Creatinine improving to 1.22 today.  Treated with IV fluids and discontinued on 04/24/2022.  Currently still appears volume overloaded: Continue IV Lasix 80 mg every 12 hours.  Repeat a.m. labs.  Losartan and torsemide on hold.  Elevated troponin History of CAD/CABG -High-sensitivity troponins 110 and 116 subsequently on presentation.  Possibly from demand ischemia from sepsis and  hypotension.  No chest pain; EKG without any acute abnormalities.  No further work-up needed -Continue aspirin.  Beta-blocker resumed on 04/24/2022.  Blood pressures have improved.  Acute on chronic systolic and diastolic CHF -Compensated.  Strict input and output.  Daily weights.  Last echo in 2021 had shown EF of 55 to 60% with grade 2 diastolic dysfunction. -Outpatient follow-up with PCP/cardiology.  Diuretic plan as above.  Hold losartan and torsemide.    Diabetes mellitus type 2 -Blood sugars stable.  Continue lower-dose of NPH  Hypertension -Patient was hypotensive on presentation.  Blood pressure improving.  Torsemide and losartan on hold.  Continue metoprolol.  Lasix plan as above.  BPH -Continue tamsulosin -Patient needed straight in and out cath this morning for retention.  Continue the same if patient has recurrence of retention.  Outpatient follow-up with urology  Depression -Continue amitriptyline and venlafaxine  Possible anemia of chronic disease -From chronic illnesses.  Hemoglobin currently stable  Hyponatremia -Improved  Hypomagnesemia -Replace.  Repeat a.m. labs  Hypokalemia -Replace.  Repeat a.m. labs  Rectal cancer -Follows with Dr. Delton Coombes.  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  Currently under surveillance.  Outpatient follow-up with oncology.  Morbid obesity -Outpatient follow-up  Physical deconditioning -Patient is from SNF and will have to return back to SNF.  PT following.  DVT prophylaxis: Heparin Code Status: Full Family Communication: None at bedside Disposition Plan: Status is: Inpatient Remains inpatient appropriate because: Of severity of illness.  Need for antibiotics and IV Lasix.  Possible discharge in 1 to 3 days pending clinical improvement   Consultants: None  Procedures: None  Antimicrobials: Rocephin and vancomycin from 04/22/2022 onwards   Subjective: Patient seen and examined at bedside.  Poor  historian.  No seizures, vomiting, chest pain or fever reported.  Still does not feel well.  Does not elaborate on it.   Objective: Vitals:   04/27/22 1949 04/28/22 0500 04/28/22 0551 04/28/22 0715  BP:   (!) 145/60   Pulse:   70   Resp:   20   Temp:   97.6 F (36.4 C)   TempSrc:   Oral   SpO2: 97%  95% 96%  Weight:  130.9 kg    Height:        Intake/Output Summary (Last 24 hours) at 04/28/2022 0749 Last data filed at 04/28/2022 0700 Gross per 24 hour  Intake 1586.37 ml  Output 2100 ml  Net -513.63 ml    Filed Weights   04/26/22 0551 04/27/22 0500 04/28/22 0500  Weight: 127 kg 127.3 kg 130.9 kg    Examination:  General: Currently still on room air.  No distress.  Looks chronically ill and deconditioned. ENT/neck: No palpable neck masses; no JVD elevation noted respiratory: Decreased breath sounds at bases bilaterally with some scattered crackles, no wheezing  CVS: S1 and S2 heard; rate is currently controlled abdominal: Soft, morbidly obese, nontender, distended still; no organomegaly, bowel sounds are heard extremities: No clubbing; bilateral lower extremity edema present CNS: Slow to respond; awake; poor historian.  No focal neurologic deficit.  Moves extremities  lymph: No obvious palpable lymphadenopathy skin: Left lower extremity is wrapped.  Right lower extremity erythema and swelling improving.  No obvious other ecchymosis or lesions present  psych: Affect is flat.  Currently not agitated.  Does not participate in conversation much.   Musculoskeletal: No obvious other joint swelling/deformity     Data Reviewed: I have personally reviewed following labs and imaging studies  CBC: Recent Labs  Lab 04/24/22 0526 04/25/22 0455 04/26/22 0535 04/27/22 0605 04/28/22 0434  WBC 18.0* 20.5* 16.4* 15.8* 17.3*  NEUTROABS 15.6* 17.7* 13.0* 12.6* 12.1*  HGB 9.7* 9.2* 8.7* 8.6* 8.2*  HCT 30.3* 29.0* 26.7* 26.1* 25.1*  MCV 93.2 91.8 90.8 89.7 90.3  PLT 231 252 287 320  675    Basic Metabolic Panel: Recent Labs  Lab 04/24/22 0526 04/25/22 0455 04/26/22 0535 04/27/22 0605 04/28/22 0434  NA 134* 135 137 135 136  K 3.9 3.8 3.6 3.4* 3.4*  CL 104 104 104 104 104  CO2 22 21* '22 23 24  '$ GLUCOSE 131* 95 121* 138* 138*  BUN 54* 41* 35* 29* 27*  CREATININE 1.84* 1.40* 1.23 1.18 1.22  CALCIUM 7.6* 7.7* 7.9* 8.0* 8.0*  MG 1.3* 1.3* 1.6* 1.4* 1.6*    GFR: Estimated Creatinine Clearance: 87.4 mL/min (by C-G formula based on SCr of 1.22 mg/dL). Liver Function Tests: Recent Labs  Lab 04/22/22 1953 04/24/22 0526 04/26/22 0535  AST 64* 46* 21  ALT '24 25 19  '$ ALKPHOS 71 97 132*  BILITOT 1.1 0.7 1.0  PROT 5.8* 6.4* 6.3*  ALBUMIN 2.6* 2.7* 2.3*    No results for input(s): "LIPASE", "AMYLASE" in the last 168 hours. No results for input(s): "AMMONIA" in the last 168 hours. Coagulation Profile: Recent Labs  Lab 04/22/22 1953  INR 1.6*    Cardiac Enzymes: No results for input(s): "CKTOTAL", "CKMB", "CKMBINDEX", "TROPONINI" in the last 168 hours. BNP (last 3 results) No results for input(s): "PROBNP" in the last 8760 hours. HbA1C: No results for input(s): "HGBA1C" in the last 72 hours. CBG: Recent Labs  Lab 04/27/22 0720 04/27/22 1124 04/27/22 1600 04/27/22 2228 04/28/22 0705  GLUCAP 125* 219*  182* 212* 129*    Lipid Profile: No results for input(s): "CHOL", "HDL", "LDLCALC", "TRIG", "CHOLHDL", "LDLDIRECT" in the last 72 hours. Thyroid Function Tests: No results for input(s): "TSH", "T4TOTAL", "FREET4", "T3FREE", "THYROIDAB" in the last 72 hours. Anemia Panel: No results for input(s): "VITAMINB12", "FOLATE", "FERRITIN", "TIBC", "IRON", "RETICCTPCT" in the last 72 hours. Sepsis Labs: Recent Labs  Lab 04/22/22 1637 04/22/22 1953  LATICACIDVEN 4.1* 2.6*     Recent Results (from the past 240 hour(s))  Culture, blood (Routine x 2)     Status: None   Collection Time: 04/22/22  4:37 PM   Specimen: Right Antecubital; Blood  Result  Value Ref Range Status   Specimen Description RIGHT ANTECUBITAL  Final   Special Requests   Final    BOTTLES DRAWN AEROBIC AND ANAEROBIC Blood Culture results may not be optimal due to an inadequate volume of blood received in culture bottles   Culture   Final    NO GROWTH 5 DAYS Performed at [facility], [address], [place], [place] [address]    Report Status 04/27/2022 FINAL  Final  Culture, blood (Routine x 2)     Status: None   Collection Time: 04/22/22  4:37 PM   Specimen: Left Antecubital; Blood  Result Value Ref Range Status   Specimen Description LEFT ANTECUBITAL  Final   Special Requests   Final    BOTTLES DRAWN AEROBIC AND ANAEROBIC Blood Culture adequate volume   Culture   Final    NO GROWTH 5 DAYS Performed at [facility], [address], [place], [place] [address]    Report Status 04/27/2022 FINAL  Final  Urine Culture     Status: None   Collection Time: 04/22/22  6:46 PM   Specimen: Urine, Clean Catch  Result Value Ref Range Status   Specimen Description   Final    URINE, CLEAN CATCH Performed at [facility], [address] [place], [place] [address]    Special Requests   Final    NONE Performed at [facility], [address], [place], [place] [address]    Culture   Final    NO GROWTH Performed at [facility], [place] [name], [place], [place] [address]    Report Status 04/24/2022 FINAL  Final  MRSA Next Gen by PCR, Nasal     Status: Abnormal   Collection Time: 04/23/22 11:55 AM   Specimen: Nasal Mucosa; Nasal Swab  Result Value Ref Range Status   MRSA by PCR Next Gen DETECTED (A) NOT DETECTED Final    Comment: RESULT CALLED TO, READ BACK BY AND VERIFIED WITH: JENNIFER KING @ 7262 ON 04/23/22 C VARNER (NOTE) The GeneXpert MRSA Assay (FDA approved for NASAL specimens only), is one component of a comprehensive MRSA colonization surveillance program. It is not intended to diagnose MRSA infection nor to guide or monitor treatment for MRSA  infections. Test performance is not FDA approved in patients less than 83 years old. Performed at [facility], [address], [place],  [address]          Radiology Studies: No results found.      Scheduled Meds:  amitriptyline  25 mg Oral QHS   aspirin EC  81 mg Oral Q breakfast   furosemide  80 mg Intravenous BID   gabapentin  600 mg Oral BID   heparin  5,000 Units Subcutaneous Q8H   insulin aspart  0-15 Units Subcutaneous TID WC   insulin aspart  0-5 Units Subcutaneous  QHS   insulin aspart protamine- aspart  8 Units Subcutaneous BID WC   levothyroxine  75 mcg Oral Q0600   metoprolol succinate  12.5 mg Oral BID   mometasone-formoterol  2 puff Inhalation BID   mupirocin ointment   Nasal BID   pantoprazole  40 mg Oral Daily   tamsulosin  0.8 mg Oral Daily   venlafaxine XR  225 mg Oral Daily   Continuous Infusions:  cefTRIAXone (ROCEPHIN)  IV 2 g (04/27/22 2201)   vancomycin 1,750 mg (04/27/22 1831)          Aline August, MD [facility] 04/28/2022, 7:49 AM

## 2022-04-28 NOTE — Progress Notes (Signed)
Wound care performed on left lower leg. Per order. Old dressing removed. Pt tolerated well.

## 2022-04-28 NOTE — Consult Note (Addendum)
[place] Nurse Consult Note: Discussed with Bedside nurse, B. Alford Highland that patient's left LE is not responding to topical care. She performed dressing change on Friday, 6/16. Right leg is responding to xeroform dressings as a wound contact layer, topping with ABD pads for comfort and absorption and securement with Kerlix roll gauze. She reports to this writer that she noted today two large serum-filled blisters at the anterior left foot, each "golf-ball" sized and with increased serous and serosanguinous exudate.  I have communicated to Dr. Starla Link via Secure Chat the findings of the Bedside nurse and my recommendation that a consult with ID or General Surgery be considered, particularly in light of improvement in the RLE but deterioration in the LLE.   Patient continues with LE elevation, both when in the chair and in bed. Prevalon boots are in use to Float heels when in bed.  [place] nursing team will not follow, but will remain available to this patient, the nursing and medical teams.  Please re-consult if needed.  Thank you for inviting [place] to participate in this patient's Plan of Care.  Maudie Flakes, MSN, RN, CNS, [place], Serita Grammes, [employer], [employer] phone:  [phone]

## 2022-04-28 NOTE — Progress Notes (Signed)
Physical Therapy Treatment Patient Details Name: Shane Chambers MRN: [id number] DOB: 1960/12/31 Today's Date: 04/28/2022   History of Present Illness Shane Chambers is a 61 y.o. male with medical history significant for diabetes mellitus, systolic and diastolic CHF, depression, CABG x5, COPD OSA, rectal cancer.  Patient was brought to the ED reports of bilateral lower extremity pain swelling and redness.  Patient tells me he has had chronic pain in his lower extremities but the pain was worse yesterday.  Also reports feeling ill.  Denies vomiting, no loose stools no abdominal pain, denies chest pain, no cough no difficulty breathing.  No neck pain or stiffness, denies urinary symptoms.  Reports pain in his buttock area, but he denies any ulcers involving his back or buttock area.    PT Comments    Patient agreeable to participating in therapy session today. Patient reports 10/10 left lower extremity pain. Nursing notified patient requesting pain medication. Patient able to slowly complete seated therapeutic exercises complaining of left lower extremity pain throughout. Patient required rest breaks between sets of exercises. Post therapeutic exercises, patient declined sit to stand exercises or short distance ambulation in room.  Patient positioned in recliner with feet up. Patient had been complaining of being cold before and during session, so PT placed 3 double blankets over him for comfort. Patient would continue to benefit from skilled physical therapy in current environment and next venue to continue return to prior function and increase strength, endurance, balance, coordination, and functional mobility and gait skills.    Recommendations for follow up therapy are one component of a multi-disciplinary discharge planning process, led by the attending physician.  Recommendations may be updated based on patient status, additional functional criteria and insurance authorization.  Follow Up  Recommendations  Skilled nursing-short term rehab (<3 hours/day)     Assistance Recommended at Discharge Set up Supervision/Assistance  Patient can return home with the following A lot of help with walking and/or transfers;A little help with bathing/dressing/bathroom;Help with stairs or ramp for entrance;Assistance with cooking/housework   Equipment Recommendations  None recommended by PT    Recommendations for Other Services       Precautions / Restrictions Precautions Precautions: Fall Restrictions Weight Bearing Restrictions: No     Mobility  Bed Mobility  Patient seated in chair at beginning and end of session.    Transfers        Ambulation/Gait                   Stairs             Wheelchair Mobility    Modified Rankin (Stroke Patients Only)       Balance                  Cognition Arousal/Alertness: Awake/alert Behavior During Therapy: WFL for tasks assessed/performed Overall Cognitive Status: Within Functional Limits for tasks assessed            Exercises General Exercises - Lower Extremity Ankle Circles/Pumps: AROM, Strengthening, Both, 10 reps, Seated Long Arc Quad: AROM, Strengthening, Both, 10 reps, Seated Heel Slides: AROM, Strengthening, Left, 10 reps, Seated Hip ABduction/ADduction: Strengthening, Both, 10 reps, Seated Hip Flexion/Marching: AROM, Strengthening, Both, 10 reps, Seated Toe Raises: AROM, Strengthening, Both, 10 reps, Seated Heel Raises: AROM, Strengthening, Both, 10 reps, Seated    General Comments        Pertinent Vitals/Pain Pain Assessment Pain Assessment: 0-10 Pain Score: 10-Worst pain ever Pain Location: BLE, LLE worse especially  knee Pain Descriptors / Indicators: Grimacing, Guarding, Sore Pain Intervention(s): Limited activity within patient's tolerance, Patient requesting pain meds-RN notified, Monitored during session, Repositioned    Home Living             Prior Function             PT Goals (current goals can now be found in the care plan section) Acute Rehab PT Goals Patient Stated Goal: return home PT Goal Formulation: With patient Time For Goal Achievement: 05/07/22 Potential to Achieve Goals: Good Progress towards PT goals: Progressing toward goals    Frequency    Min 3X/week      PT Plan Current plan remains appropriate       AM-PAC PT "6 Clicks" Mobility   Outcome Measure  Help needed turning from your back to your side while in a flat bed without using bedrails?: A Little Help needed moving from lying on your back to sitting on the side of a flat bed without using bedrails?: A Little Help needed moving to and from a bed to a chair (including a wheelchair)?: A Little Help needed standing up from a chair using your arms (e.g., wheelchair or bedside chair)?: A Little Help needed to walk in hospital room?: A Lot Help needed climbing 3-5 steps with a railing? : Total 6 Click Score: 15    End of Session   Activity Tolerance: Patient limited by pain Patient left: with call bell/phone within reach;in chair Nurse Communication: Mobility status PT Visit Diagnosis: Unsteadiness on feet (R26.81);Other abnormalities of gait and mobility (R26.89);Muscle weakness (generalized) (M62.81)     Time: 1343-1410 PT Time Calculation (min) (ACUTE ONLY): 27 min  Charges:  $Therapeutic Exercise: 23-37 mins                     Floria Raveling. Hartnett-Rands, MS, PT [place] [phone]  Pamala Hurry  Hartnett-Rands 04/28/2022, 2:16 PM

## 2022-04-28 NOTE — Progress Notes (Signed)
Drsg change to LLE again d/t increased serous and serosanguinous drainage .

## 2022-04-29 ENCOUNTER — Inpatient Hospital Stay (HOSPITAL_COMMUNITY): Payer: Medicare Other

## 2022-04-29 DIAGNOSIS — N179 Acute kidney failure, unspecified: Secondary | ICD-10-CM | POA: Diagnosis not present

## 2022-04-29 DIAGNOSIS — L03115 Cellulitis of right lower limb: Secondary | ICD-10-CM | POA: Diagnosis not present

## 2022-04-29 DIAGNOSIS — E872 Acidosis, unspecified: Secondary | ICD-10-CM

## 2022-04-29 DIAGNOSIS — A419 Sepsis, unspecified organism: Secondary | ICD-10-CM | POA: Diagnosis not present

## 2022-04-29 DIAGNOSIS — R6520 Severe sepsis without septic shock: Secondary | ICD-10-CM | POA: Diagnosis not present

## 2022-04-29 DIAGNOSIS — L03116 Cellulitis of left lower limb: Secondary | ICD-10-CM | POA: Diagnosis not present

## 2022-04-29 DIAGNOSIS — R778 Other specified abnormalities of plasma proteins: Secondary | ICD-10-CM | POA: Diagnosis not present

## 2022-04-29 LAB — CBC WITH DIFFERENTIAL/PLATELET
Abs Immature Granulocytes: 1.4 10*3/uL — ABNORMAL HIGH (ref 0.00–0.07)
Band Neutrophils: 2 %
Basophils Absolute: 0.2 10*3/uL — ABNORMAL HIGH (ref 0.0–0.1)
Basophils Relative: 1 %
Eosinophils Absolute: 0.2 10*3/uL (ref 0.0–0.5)
Eosinophils Relative: 1 %
HCT: 25.7 % — ABNORMAL LOW (ref 39.0–52.0)
Hemoglobin: 8.2 g/dL — ABNORMAL LOW (ref 13.0–17.0)
Lymphocytes Relative: 5 %
Lymphs Abs: 1 10*3/uL (ref 0.7–4.0)
MCH: 29.4 pg (ref 26.0–34.0)
MCHC: 31.9 g/dL (ref 30.0–36.0)
MCV: 92.1 fL (ref 80.0–100.0)
Metamyelocytes Relative: 5 %
Monocytes Absolute: 1.7 10*3/uL — ABNORMAL HIGH (ref 0.1–1.0)
Monocytes Relative: 9 %
Myelocytes: 2 %
Neutro Abs: 14.9 10*3/uL — ABNORMAL HIGH (ref 1.7–7.7)
Neutrophils Relative %: 75 %
Platelets: 419 10*3/uL — ABNORMAL HIGH (ref 150–400)
RBC: 2.79 MIL/uL — ABNORMAL LOW (ref 4.22–5.81)
RDW: 13.9 % (ref 11.5–15.5)
WBC: 19.4 10*3/uL — ABNORMAL HIGH (ref 4.0–10.5)
nRBC: 0 % (ref 0.0–0.2)

## 2022-04-29 LAB — BASIC METABOLIC PANEL
Anion gap: 8 (ref 5–15)
BUN: 28 mg/dL — ABNORMAL HIGH (ref 6–20)
CO2: 22 mmol/L (ref 22–32)
Calcium: 8.2 mg/dL — ABNORMAL LOW (ref 8.9–10.3)
Chloride: 104 mmol/L (ref 98–111)
Creatinine, Ser: 1.21 mg/dL (ref 0.61–1.24)
GFR, Estimated: >60 mL/min (ref >60)
Glucose, Bld: 171 mg/dL — ABNORMAL HIGH (ref 70–99)
Potassium: 3.7 mmol/L (ref 3.5–5.1)
Sodium: 134 mmol/L — ABNORMAL LOW (ref 135–145)

## 2022-04-29 LAB — URINALYSIS, COMPLETE (UACMP) WITH MICROSCOPIC
Bilirubin Urine: NEGATIVE
Glucose, UA: NEGATIVE mg/dL
Ketones, ur: NEGATIVE mg/dL
Leukocytes,Ua: NEGATIVE
Nitrite: NEGATIVE
Protein, ur: 100 mg/dL — AB
Specific Gravity, Urine: 1.012 (ref 1.005–1.030)
pH: 5 (ref 5.0–8.0)

## 2022-04-29 LAB — GLUCOSE, CAPILLARY
Glucose-Capillary: 134 mg/dL — ABNORMAL HIGH (ref 70–99)
Glucose-Capillary: 180 mg/dL — ABNORMAL HIGH (ref 70–99)
Glucose-Capillary: 141 mg/dL — ABNORMAL HIGH (ref 70–99)
Glucose-Capillary: 206 mg/dL — ABNORMAL HIGH (ref 70–99)

## 2022-04-29 LAB — MAGNESIUM: Magnesium: 1.6 mg/dL — ABNORMAL LOW (ref 1.7–2.4)

## 2022-04-29 LAB — LACTIC ACID, PLASMA: Lactic Acid, Venous: 0.5 mmol/L (ref 0.5–1.9)

## 2022-04-29 LAB — CK: Total CK: 58 U/L (ref 49–397)

## 2022-04-29 LAB — PROCALCITONIN: Procalcitonin: 0.87 ng/mL

## 2022-04-29 IMAGING — MR MR FOOT*L* WO/W CM
6 of 7 series · 30 of 40 positions shown · IV contrast (gadavist)
Comparison: None Available.

CLINICAL DATA: Non diabetic. Foot swelling. Osteomyelitis
suspected. Cellulitis of the foot. Concerned about soft tissue
abscess. Wound on top and bottom of left foot onto left lower leg
for 2 months.

EXAM:
MRI OF THE LEFT FOREFOOT WITHOUT AND WITH CONTRAST
TECHNIQUE: Multiplanar, multisequence MR imaging of the left hindfoot and
midfoot was performed both before and after administration of
intravenous contrast. Images were performed of the foot from the
level of the posterior calcaneus through the distal metatarsals on
sagittal and coronal images and the entire phalanges on axial
images.
CONTRAST:  10mL GADAVIST GADOBUTROL 1 MMOL/ML IV SOLN

[Series 3: PD fat-sat · axial · left · 3.0mm · 1.09mm/px · z∈[-103,+27]mm · 6 of 35 slices shown]
[im 1/35]
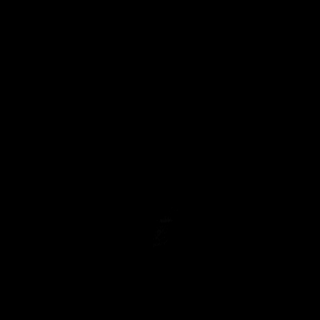
[im 7/35]
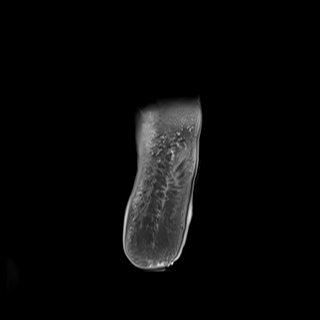
[im 14/35]
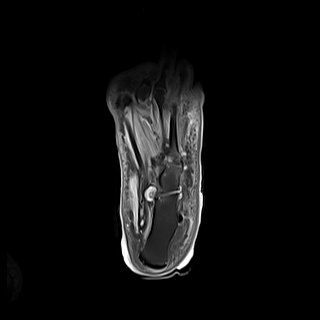
[im 21/35]
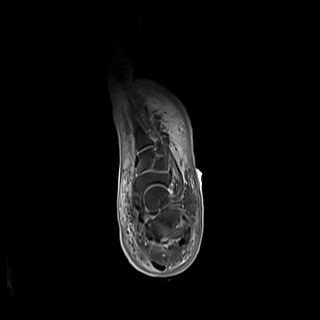
[im 28/35]
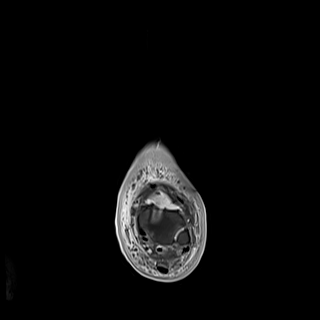
[im 35/35]
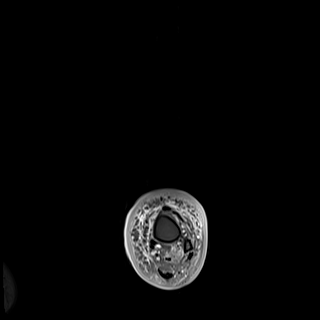

[Series 4: T2 fat-sat · axial · left · 3.0mm · 1.09mm/px · z∈[-103,+27]mm · 6 of 35 slices shown (1 of 2)]
[im 1/35]
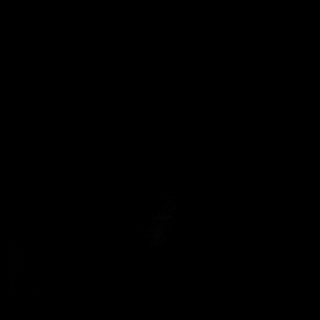
[im 7/35]
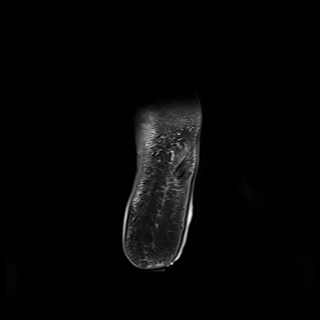
[im 14/35]
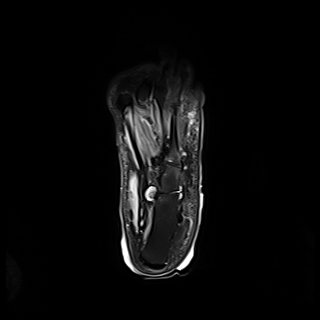
[im 21/35]
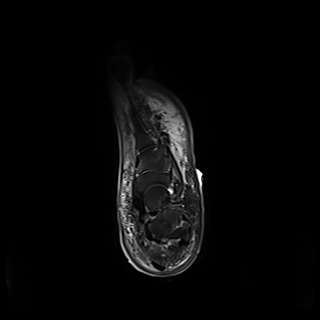
[im 28/35]
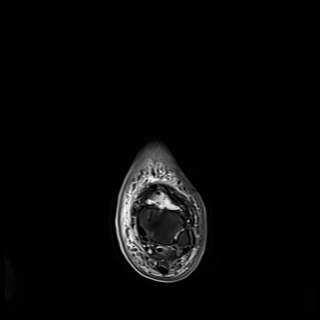
[im 35/35]
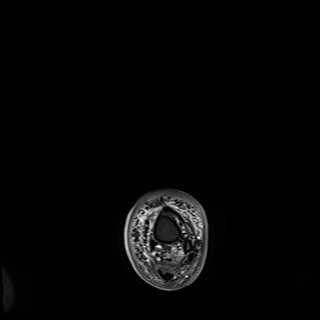

[Series 5: T2 fat-sat · coronal · left · 4.0mm · 0.50mm/px · 6 of 35 slices shown (2 of 2)]
[im 1/35]
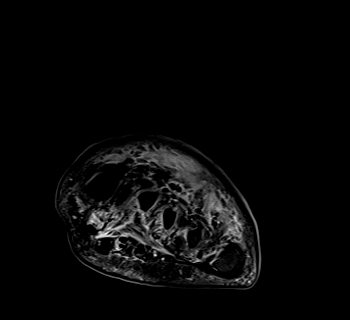
[im 7/35]
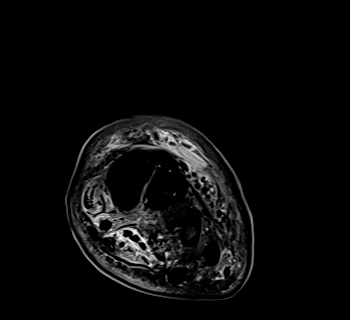
[im 14/35]
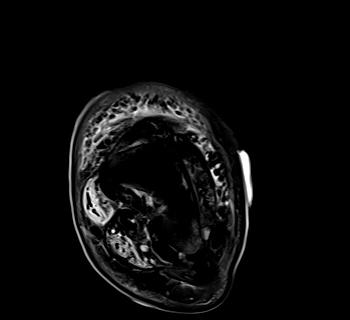
[im 21/35]
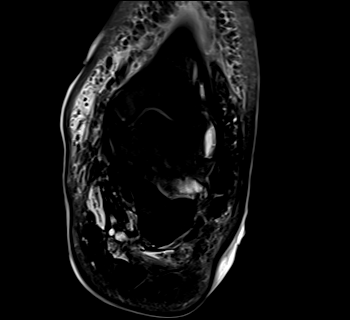
[im 28/35]
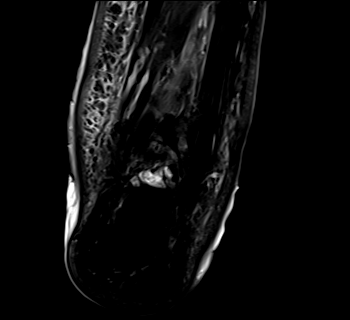
[im 35/35]
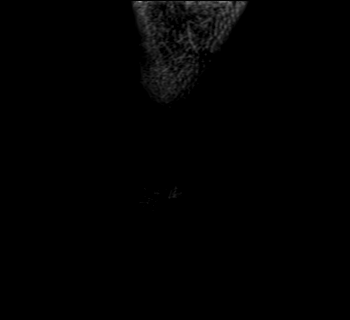

[Series 6: T1 · sagittal · left · 4.0mm · 0.64mm/px · 4 of 25 slices shown]
[im 1/25]
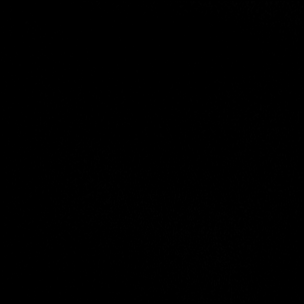
[im 9/25]
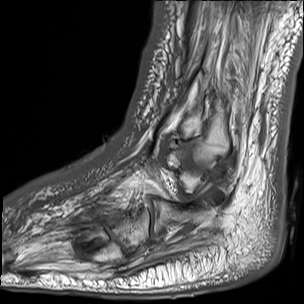
[im 17/25]
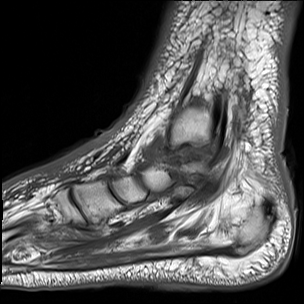
[im 25/25]
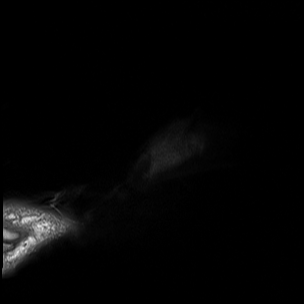

[Series 11: T1 fat-sat post-contrast · axial · left · 3.0mm · 0.50mm/px · z∈[-138,-8]mm · 6 of 35 slices shown (1 of 2)]
[im 1/35]
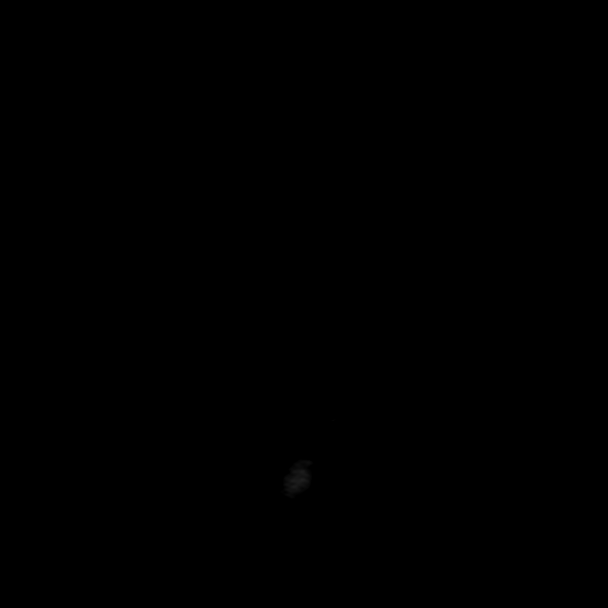
[im 7/35]
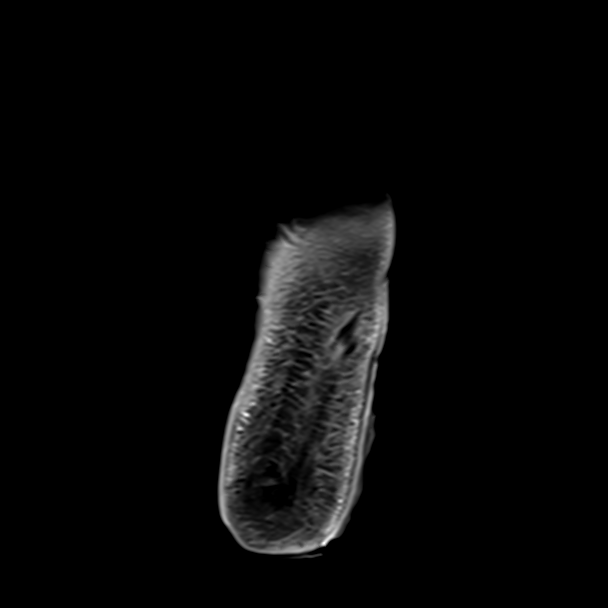
[im 14/35]
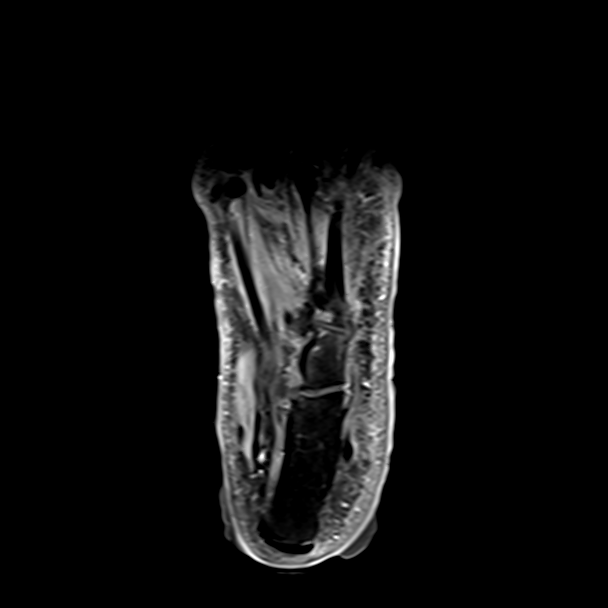
[im 21/35]
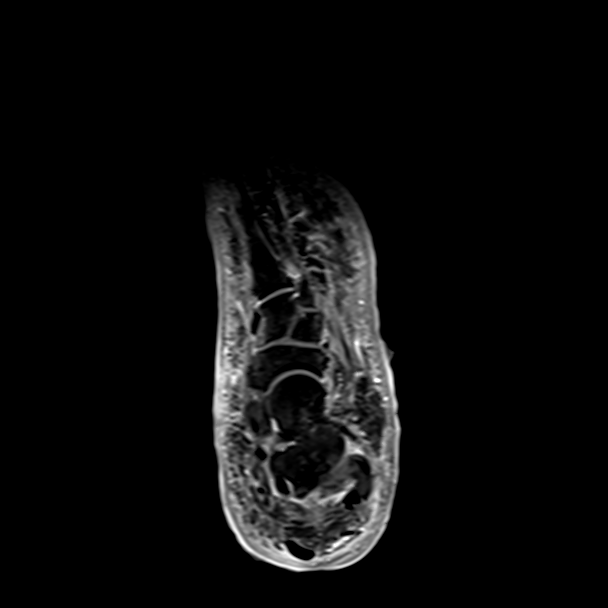
[im 28/35]
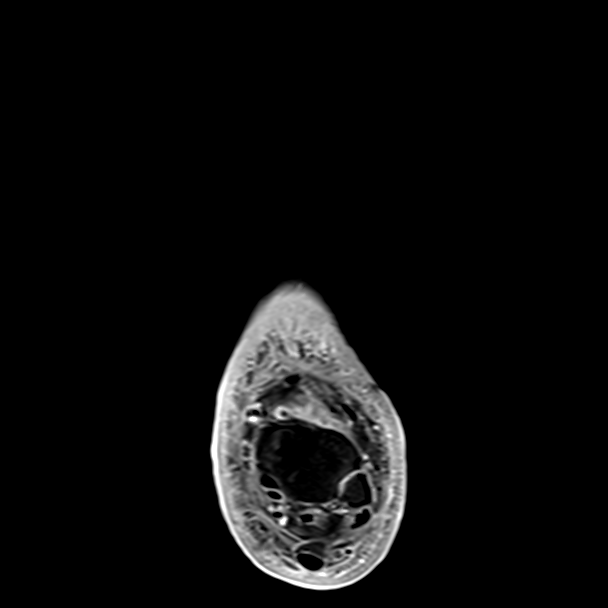
[im 35/35]
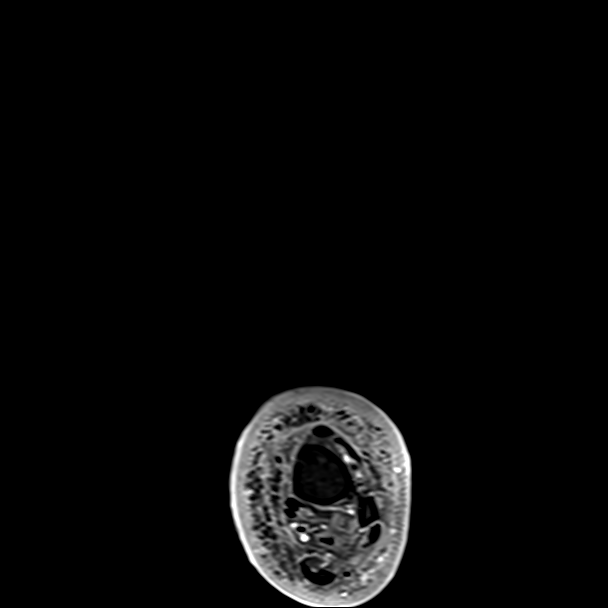

[Series 12: T1 fat-sat post-contrast · coronal · left · 3.0mm · 0.50mm/px · 2 of 44 slices shown (2 of 2)]
[im 1/44]
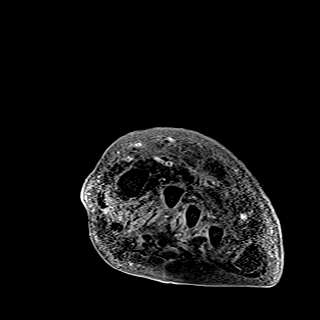
[im 7/44]
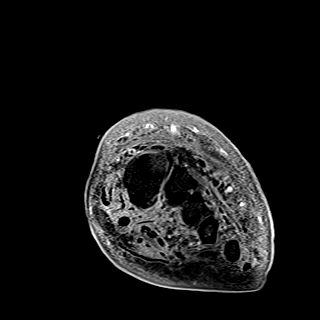

[30 of 40 positions shown; findings below may reference images not displayed]

FINDINGS: Bones/Joint/Cartilage

There appears to be cartilage thinning and subchondral cystic change
and peripheral osteophytosis indicating osteoarthritis that is
severe at the third moderate to severe at the fourth, moderate at
the second, and mild-to-moderate at the fifth tarsometatarsal
joints. Mild-to-moderate calcaneocuboid cartilage thinning.

Moderate tibiotalar and posterior subtalar joint effusions.

No cortical erosion.  No marrow edema.

Ligaments

The Lisfranc ligament complex appears intact. No high-grade ankle
ligament tear is visualized.

Muscles and Tendons

No gross large tear is visualized within the peroneus longus
peroneus brevis and posterior tibial, flexor digitorum longus,
flexor hallucis longus, tibialis anterior, extensor hallucis longus
the extensor digitorum longus tendons.

There is moderate edema and enhancement throughout the plantar
greater than dorsal intrinsic foot musculature, nonspecific
myositis.

Soft tissues

There is a decreased T1 and increased T2 signal walled-off likely
ganglion with mild internal decreased T2 complexity and no definite
internal enhancement suggesting a 2.0 cm ganglion extending medially
from the midfoot, possibly the inferior medial aspect of the
calcaneocuboid joint (axial series 3, image 21 sagittal series 7
image 11, coronal series 5, image 17).

Moderate to high-grade dorsal foot subcutaneous fat edema and
swelling.
IMPRESSION: 1. Moderate to high-grade subcutaneous fat edema and swelling
consistent with reported cellulitis.
2. Moderate to severe tarsometatarsal osteoarthritis.
3. Moderate edema and enhancement throughout the plantar greater
than dorsal intrinsic foot musculature, nonspecific myositis.
4. No cortical erosion is seen to indicate radiographic evidence of
acute osteomyelitis.
5. Mildly complex ganglion measuring up to approximately 2 cm likely
extending medially from the inferior aspect of the calcaneocuboid
articulation.

## 2022-04-29 IMAGING — MR MR [PERSON_NAME] LOW WO/W CM*L*
4 of 8 series · 17 of 40 positions shown · IV contrast (gadavist)
Comparison: Bilateral lower extremity ultrasound demonstrating soft
tissue edema [DATE];

CLINICAL DATA: Soft tissue infection suspected of left lower leg.
Cellulitis. No improvement with antibiotics. Open wound on top and
bottom of left foot onto lower leg for 2 months. History of
diabetes. No known injury.

EXAM:
MRI OF LOWER LEFT EXTREMITY WITHOUT AND WITH CONTRAST
TECHNIQUE: Multiplanar, multisequence MR imaging of the left tibia and fibula
was performed both before and after administration of intravenous
contrast.
CONTRAST:  10mL GADAVIST GADOBUTROL 1 MMOL/ML IV SOLN

[Series 4: [id number]_ · coronal · left · 4.0mm · 0.48mm/px · 3 of 36 slices shown]
[im 1/36]
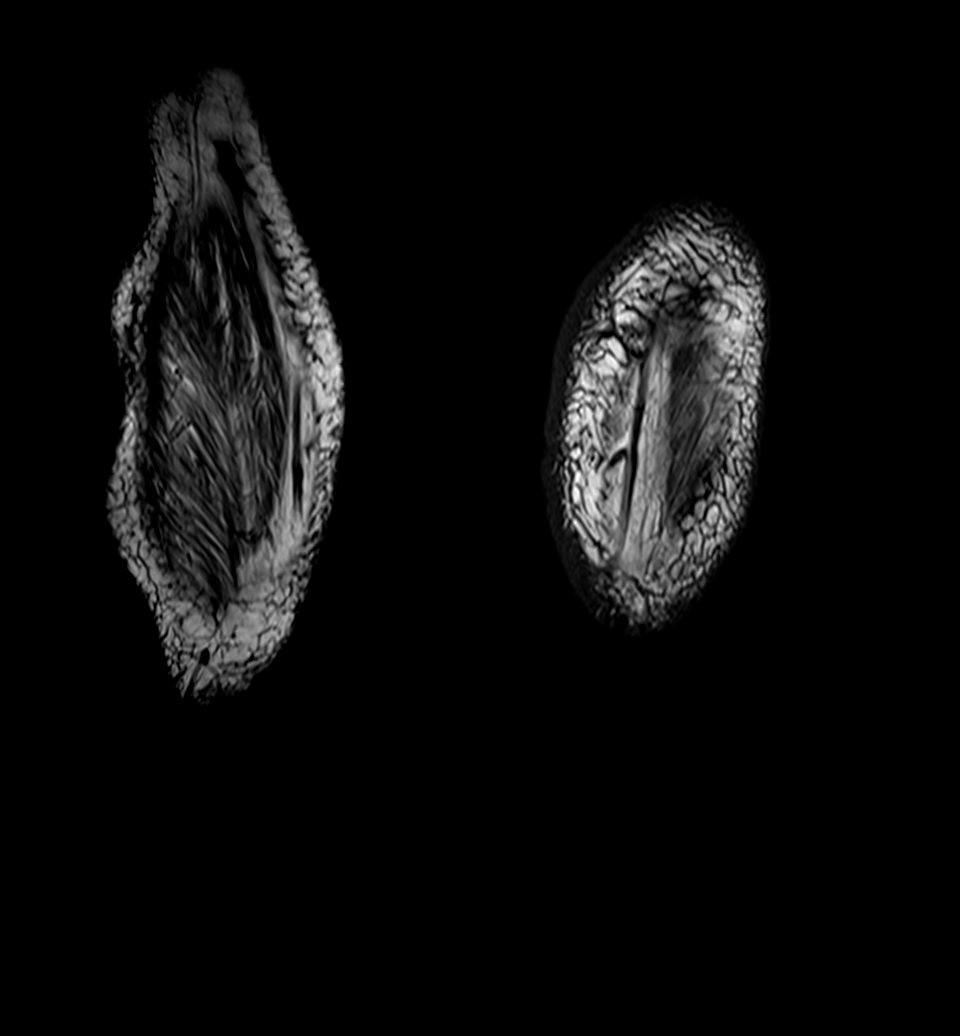
[im 24/36]
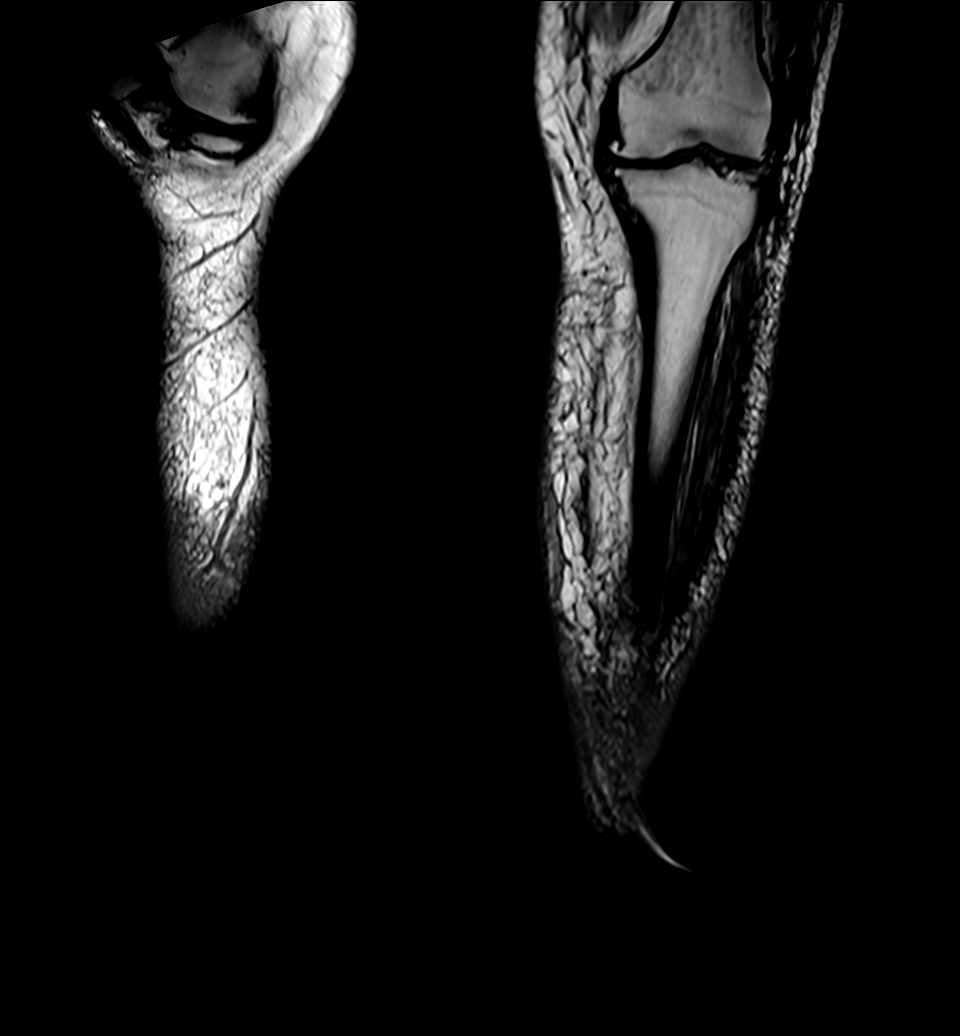
[im 36/36]
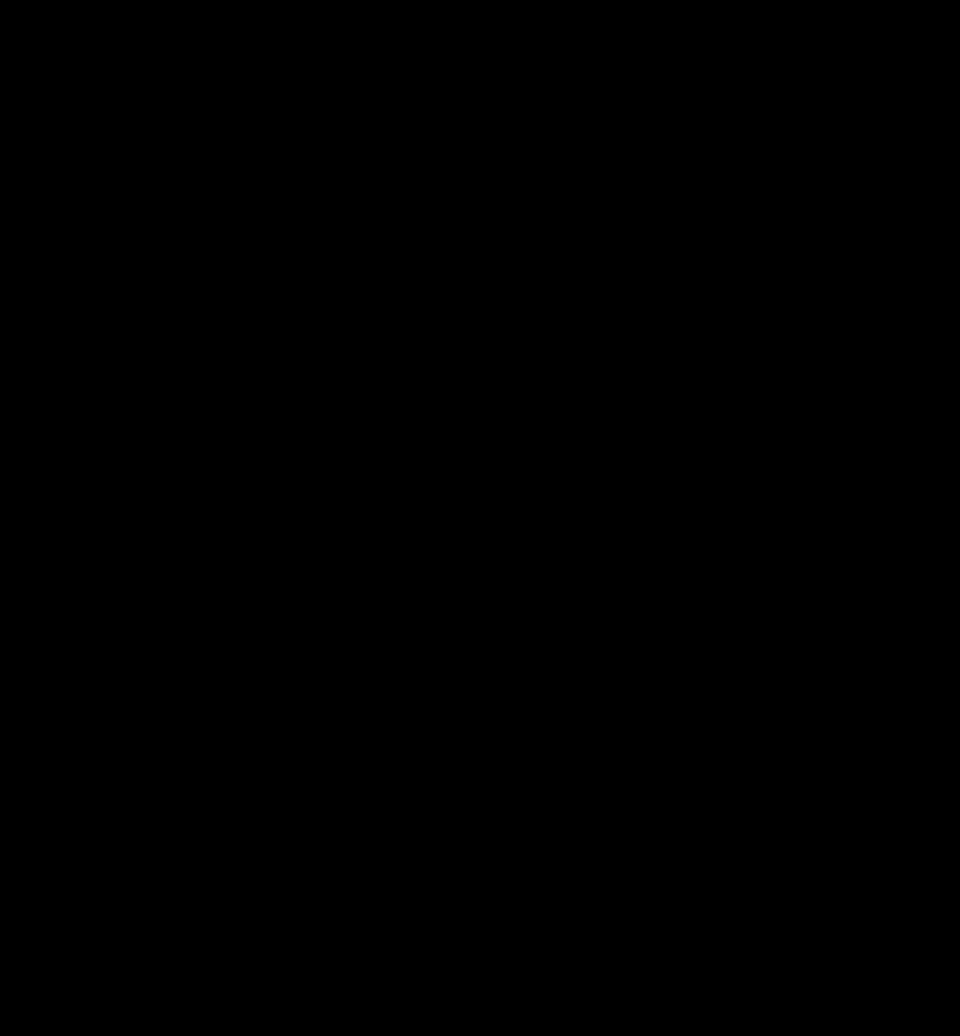

[Series 15: T1 fat-sat · axial · left · 4.0mm · 0.78mm/px · z∈[-301,+179]mm · 7 of 96 slices shown (1 of 2)]
[im 1/96]
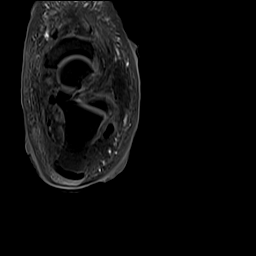
[im 16/96]
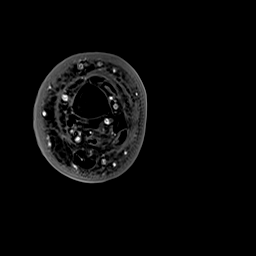
[im 32/96]
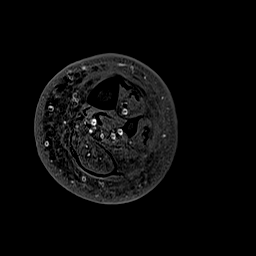
[im 48/96]
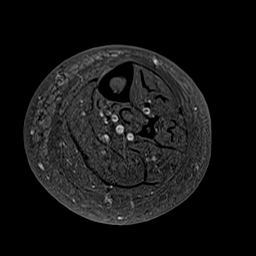
[im 64/96]
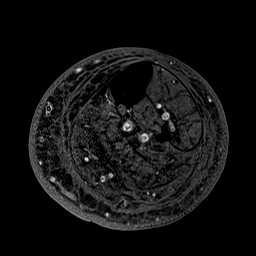
[im 80/96]
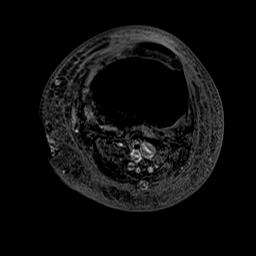
[im 96/96]
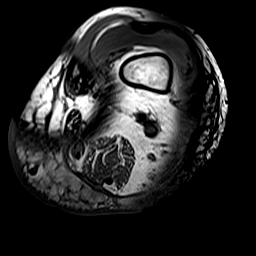

[Series 18: T1 fat-sat · axial · left · 4.0mm · 0.78mm/px · z∈[-301,+94]mm · 5 of 94 slices shown (2 of 2)]
[im 1/94]
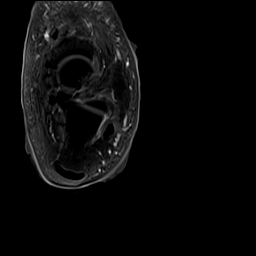
[im 16/94]
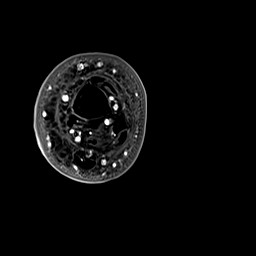
[im 32/94]
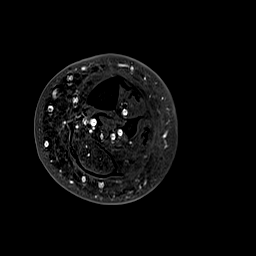
[im 47/94]
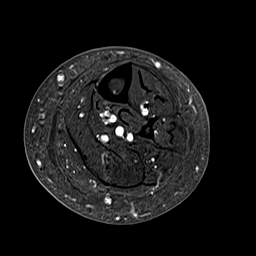
[im 78/94]
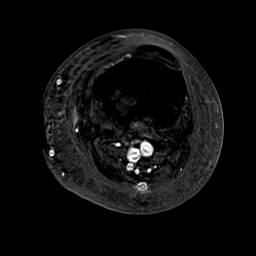

[Series 19: T1 · sagittal · left · 4.5mm · 0.46mm/px · 2 of 30 slices shown]
[im 1/30]
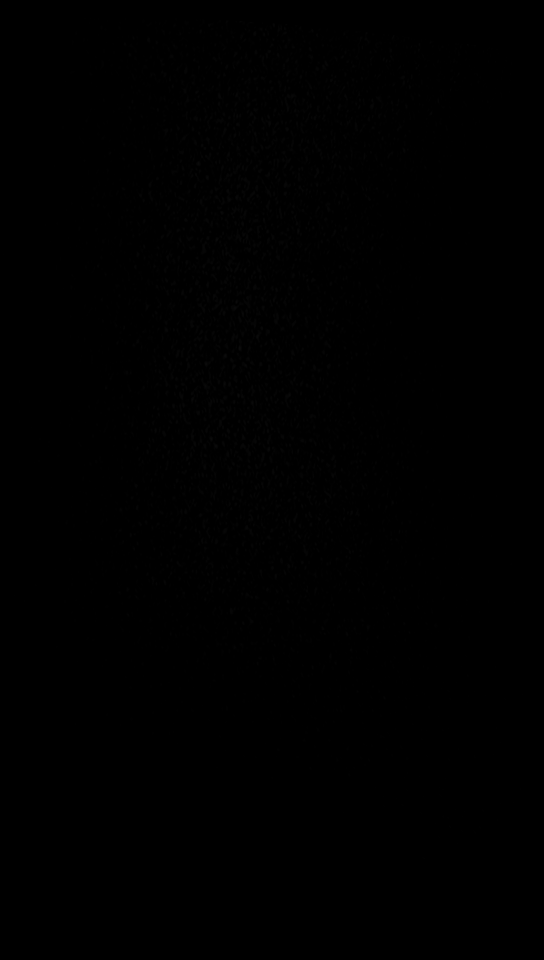
[im 30/30]
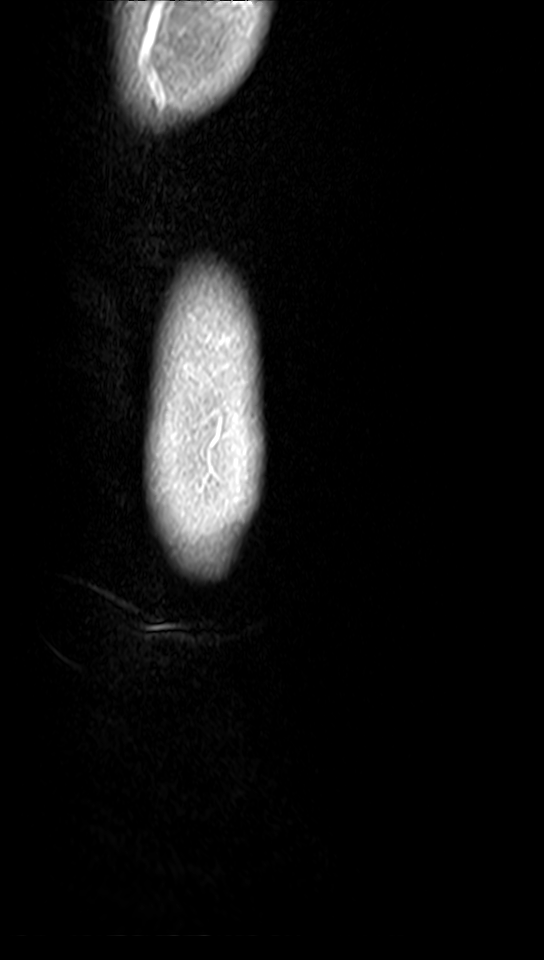

[17 of 40 positions shown; findings below may reference images not displayed]

FINDINGS: Bones/Joint/Cartilage

Normal marrow signal without edema or acute fracture. The bone
cortices appear intact.

Moderate to high-grade peripheral posteromedial femoral condyle and
more mild-to-moderate posterolateral femoral condyle degenerative
osteophytes. Mild degenerative/chronic cystic change within the
anterior patella just distal to the quadriceps tendon insertion
chronic stress related change. Moderate inferior patellar
degenerative osteophytosis.

The left knee is included on large field-of-view coronal images and
there is severe lateral and moderate medial compartment
osteoarthritis.

Ligaments

The medial and lateral collateral ligaments of the knee appear
intact.

Muscles and Tendons

Mild-to-moderate edema is seen throughout the posterior deep and
posterior superficial muscle compartments especially the proximal
distal soleus, mid to distal tibialis anterior and extensor
digitorum and extensor hallucis longus and distal posterior tibial
muscles. There is moderate diffuse feathery fatty infiltration of
the soleus and gastrocnemius muscles and more mild fatty
infiltration of the deep posterior anterior and lateral muscle
compartments.

Soft tissues

Partially visualized likely moderate knee joint effusion.

Moderate subcutaneous fat edema and swelling diffusely throughout
the left calf. Mild fluid at the far deep aspect of the medial
anterior and lateral subcutaneous fat. No walled-off fluid
collection or rim enhancing abscess is seen.

On large field-of-view images including the left lower extremity,
there is similar moderate subcutaneous fat edema and swelling and
left calf muscle swelling.
IMPRESSION: 1. Moderate subcutaneous fat edema and swelling consistent with
cellulitis.
2. Mild-to-moderate calf muscle edema greatest within the posterior
superficial and posterior deep compartments. This is consistent with
nonspecific myositis. No significant fluid is seen tracking along
the fascial planes.
3. On large field-of-view coronal images, there also appears to be
similar edema and swelling of the left calf subcutaneous fat. Left
calf muscle edema is slightly less than the right side.
4. No cortical erosion or MRI evidence of acute osteomyelitis.
5. Moderate to severe bilateral knee osteoarthritis, greatest within
the patellofemoral and medial compartments on the right and of the
visualized medial compartment on the left.

## 2022-04-29 MED ORDER — GADOBUTROL 1 MMOL/ML IV SOLN
10.0000 mL | Freq: Once | INTRAVENOUS | Status: AC | PRN
Start: 2022-04-29 — End: 2022-04-29
  Administered 2022-04-29: 10 mL via INTRAVENOUS

## 2022-04-29 MED ORDER — SODIUM CHLORIDE 0.9 % IV SOLN
2.0000 g | INTRAVENOUS | Status: DC
  Administered 2022-04-30 – 2022-05-01 (×2): 2 g via INTRAVENOUS
  Filled 2022-04-29 (×2): qty 20

## 2022-04-29 MED ORDER — LINEZOLID 600 MG/300ML IV SOLN
600.0000 mg | Freq: Two times a day (BID) | INTRAVENOUS | Status: DC
  Administered 2022-04-29 – 2022-05-02 (×6): 600 mg via INTRAVENOUS
  Filled 2022-04-29 (×8): qty 300

## 2022-04-29 NOTE — Progress Notes (Signed)
Pharmacy Antibiotic Note  Shane Chambers is a 61 y.o. male admitted on 04/22/2022 with sepsis possibly from BLE cellulitis .    Pt has been on abx for 7 days. Condition is worsened per MD and surgery. Plan to continue abx for now. D/w Dr Tat and we will continue the ceftriaxone order and change vanc to linezolid. Risk of serotonin syndrome is very low with SSRI and linezolid.  Height: '5\' 9"'$  (175.3 cm) Weight: 129.8 kg (286 lb 2.5 oz) IBW/kg (Calculated) : 70.7  Temp (24hrs), Avg:98.5 F (36.9 C), Min:98.4 F (36.9 C), Max:98.6 F (37 C)  Recent Labs  Lab 04/22/22 1953 04/23/22 0403 04/25/22 0455 04/26/22 0535 04/27/22 0605 04/28/22 0434 04/29/22 0453 04/29/22 1300  WBC 13.9*   < > 20.5* 16.4* 15.8* 17.3* 19.4*  --   CREATININE 2.45*   < > 1.40* 1.23 1.18 1.22 1.21  --   LATICACIDVEN 2.6*  --   --   --   --   --   --  0.5   < > = values in this interval not displayed.     Estimated Creatinine Clearance: 87.7 mL/min (by C-G formula based on SCr of 1.21 mg/dL).    Allergies  Allergen Reactions   Ace Inhibitors Swelling and Cough    (Not on MAR at [facility] and [facility])   Other Itching    [name]   Penicillins     Has patient had a PCN reaction causing immediate rash, facial/tongue/throat swelling, SOB or lightheadedness with hypotension: Unknown Has patient had a PCN reaction causing severe rash involving mucus membranes or skin necrosis: Unknown Has patient had a PCN reaction that required hospitalization: Unknown Has patient had a PCN reaction occurring within the last 10 years: Unknown If all of the above answers are "NO", then may proceed with Cephalosporin use.     Antimicrobials this admission: Vanc 6/13 >> 6/19 CTX 6/13 >>  Linezolid 6/20>>  Dose adjustments this admission: N/a  Microbiology results: 6/20 Bcx: pending 6/13 BCx: ng 6/13 Ucx: ng MRSA PCR: positive  Plan: Dc vanc Linezolid '600mg'$  IV BID Continue ceftriaxone 2g IV  q24  Onnie Boer, PharmD, [place], AAHIVP, CPP Infectious Disease Pharmacist 04/29/2022 6:59 PM

## 2022-04-29 NOTE — Progress Notes (Addendum)
PROGRESS NOTE  Shane Chambers BPZ:[id number] DOB: [birth year] DOA: 04/22/2022 PCP: Caprice Renshaw, MD  Brief History:  61 y.o. Chambers with medical history significant for diabetes mellitus, chronic systolic and diastolic CHF, depression, CABG x5, COPD, OSA, rectal cancer presented with bilateral lower extremity pain, swelling and redness.  On presentation, he was hypotensive with systolic blood pressure in the 80s with WBC of 13.9, lactic acid of 4.1 and subsequently 2.6; high-sensitivity troponin 110 and 116 subsequently and creatinine elevated at 2.45.  UA was not suggestive of UTI.  Chest x-ray showed no acute abnormality.  He was started on IV fluids and antibiotics.  The patient has been on ceftriaxone and vancomycin since 04/22/2022.  In the last 72 hours, the patient's WBC count has gradually increased.  It appears that his left leg erythema, edema and pain have not much.  The patient remains afebrile and hemodynamically stable.  MRI of the left lower extremity was ordered on 04/29/2022.  Repeat wound care consult was ordered after new photos were taken on 04/29/2022.    Assessment and Plan: Severe sepsis: Present on admission Bilateral lower leg cellulitis Lactic acidosis Leukocytosis -Presented with hypotension, leukocytosis, lactic acidosis, acute kidney injury secondary to possible bilateral lower extremity cellulitis.  UA and chest x-ray not suggestive of infection. -Blood pressure improved.  6/13  Blood cultures negative. -Patient has been on vancomycin and ceftriaxone since 04/22/2022 -Left lower extremity venous Doppler was negative for DVT -WBCs worsened in the last 72 hours ultrasound showed no organized fluid collection and was compatible with cellulitis -Repeat wound care consult was ordered after new photos were taken on 04/29/2022. -MRI left lower extremity -d/c vanc, start zyvox   Acute on chronic renal failure--CKD stage IIIa -Recent baseline of 1.1-1.4.  Presented  with creatinine of 2.45.  Creatinine improving to 1.22 today.  Treated with IV fluids and discontinued on 04/24/2022.  Currently still appears volume overloaded: Continue IV Lasix 80 mg every 12 hours.  Repeat a.m. labs.  Losartan and torsemide on hold.   Elevated troponin History of CAD/CABG -High-sensitivity troponins 110 and 116 subsequently on presentation. -from demand ischemia from sepsis and hypotension.   -No chest pain; EKG without any acute abnormalities.  No further work-up needed -Continue aspirin.  Beta-blocker resumed on 04/24/2022.  Blood pressures have improved.   Acute on chronic systolic and diastolic CHF -Strict input and output.  Daily weights.  Last echo in 2021 had shown EF of 55 to 60% with grade 2 diastolic dysfunction. -Continue Lasix 80 mg IV twice daily   Diabetes mellitus type 2 -Blood sugars stable.  Continue lower-dose of 70/30 -CBGs largely controlled  Hypertension -Patient was hypotensive on presentation.  Blood pressure improving.  Torsemide and losartan on hold.  Continue metoprolol.  Lasix plan as above.  BPH -Continue tamsulosin -Patient needed straight in and out cath 6/19  Continue the same if patient has recurrence of retention.  Outpatient follow-up with urology  Depression -Continue amitriptyline and venlafaxine   anemia of chronic disease -From chronic illnesses.  Hemoglobin currently stable   Hyponatremia -Improved   Hypomagnesemia -replete  Hypokalemia -Replace.  Repeat a.m. labs   Rectal cancer -Follows with Dr. Delton Coombes.  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  Currently under surveillance.  Outpatient follow-up with oncology.   Morbid obesity -BMI 42.26 -Lifestyle modification   Physical deconditioning -Patient is from SNF and will have to return back to SNF.  Family Communication:   no Family at bedside  Consultants:  none  Code Status:  FULL   DVT Prophylaxis:  Rolfe Heparin     Procedures: As Listed in Progress Note Above  Antibiotics: Ceftriaxone 6/13>> Vanc 6/13>>6/20 Zyvox 6/20>>     Subjective: Patient complains of left leg pain not much changed.  He denies any fevers, chills, headache, chest pain, shortness breath, nausea, vomiting, diarrhea, abdominal pain.  He has a nonproductive cough.  Objective: Vitals:   04/28/22 1304 04/28/22 2002 04/28/22 2111 04/29/22 0534  BP: (!) 147/76  (!) 142/63 (!) 154/73  Pulse: 88 82 83 80  Resp: 20 (!) 22 20 (!) 21  Temp: 98.1 F (36.7 C)  98.4 F (36.9 C) 98.6 F (37 C)  TempSrc: Oral  Oral   SpO2: 96% 95% 94% 96%  Weight:    129.8 kg  Height:        Intake/Output Summary (Last 24 hours) at 04/29/2022 1120 Last data filed at 04/29/2022 0900 Gross per 24 hour  Intake 1410 ml  Output 1750 ml  Net -340 ml   Weight change: -1.1 kg Exam:  General:  Pt is alert, follows commands appropriately, not in acute distress HEENT: No icterus, No thrush, No neck mass, [place]/AT Cardiovascular: RRR, S1/S2, no rubs, no gallops Respiratory: Bibasilar crackles.  No wheezing.  Air movement Abdomen: Soft/+BS, non tender, non distended, no guarding Extremities: 2+ edema left lower extremity with erythema and bullous lesions. LEFT LEG             Data Reviewed: I have personally reviewed following labs and imaging studies Basic Metabolic Panel: Recent Labs  Lab 04/25/22 0455 04/26/22 0535 04/27/22 0605 04/28/22 0434 04/29/22 0453  NA 135 137 135 136 134*  K 3.8 3.6 3.4* 3.4* 3.7  CL 104 104 104 104 104  CO2 21* '22 23 24 22  '$ GLUCOSE 95 121* 138* 138* 171*  BUN 41* 35* 29* 27* 28*  CREATININE 1.40* 1.23 1.18 1.22 1.21  CALCIUM 7.7* 7.9* 8.0* 8.0* 8.2*  MG 1.3* 1.6* 1.4* 1.6* 1.6*   Liver Function Tests: Recent Labs  Lab 04/22/22 1953 04/24/22 0526 04/26/22 0535  AST 64* 46* 21  ALT '24 25 19  '$ ALKPHOS 71 97 132*  BILITOT 1.1 0.7 1.0  PROT 5.8* 6.4* 6.3*  ALBUMIN 2.6* 2.7* 2.3*   No  results for input(s): "LIPASE", "AMYLASE" in the last 168 hours. No results for input(s): "AMMONIA" in the last 168 hours. Coagulation Profile: Recent Labs  Lab 04/22/22 1953  INR 1.6*   CBC: Recent Labs  Lab 04/25/22 0455 04/26/22 0535 04/27/22 0605 04/28/22 0434 04/29/22 0453  WBC 20.5* 16.4* 15.8* 17.3* 19.4*  NEUTROABS 17.7* 13.0* 12.6* 12.1* 14.9*  HGB 9.2* 8.7* 8.6* 8.2* 8.2*  HCT 29.0* 26.7* 26.1* 25.1* 25.7*  MCV 91.8 90.8 89.7 90.3 92.1  PLT 252 287 320 385 419*   Cardiac Enzymes: Recent Labs  Lab 04/29/22 0453  CKTOTAL 58   BNP: Invalid input(s): "POCBNP" CBG: Recent Labs  Lab 04/28/22 1123 04/28/22 1618 04/28/22 2108 04/29/22 0711 04/29/22 1108  GLUCAP 141* 196* 167* 134* 180*   HbA1C: No results for input(s): "HGBA1C" in the last 72 hours. Urine analysis:    Component Value Date/Time   COLORURINE YELLOW 04/22/2022 1846   APPEARANCEUR HAZY (A) 04/22/2022 1846   LABSPEC 1.015 04/22/2022 1846   PHURINE 5.0 04/22/2022 1846   GLUCOSEU NEGATIVE 04/22/2022 1846   HGBUR SMALL (A) 04/22/2022 1846   BILIRUBINUR NEGATIVE  04/22/2022 [place] 04/22/2022 1846   PROTEINUR 100 (A) 04/22/2022 1846   NITRITE NEGATIVE 04/22/2022 1846   LEUKOCYTESUR NEGATIVE 04/22/2022 1846   Sepsis Labs: '@LABRCNTIP'$ (procalcitonin:4,lacticidven:4) ) Recent Results (from the past 240 hour(s))  Culture, blood (Routine x 2)     Status: None   Collection Time: 04/22/22  4:37 PM   Specimen: Right Antecubital; Blood  Result Value Ref Range Status   Specimen Description RIGHT ANTECUBITAL  Final   Special Requests   Final    BOTTLES DRAWN AEROBIC AND ANAEROBIC Blood Culture results may not be optimal due to an inadequate volume of blood received in culture bottles   Culture   Final    NO GROWTH 5 DAYS Performed at [facility], [address], [place], [place] [address]    Report Status 04/27/2022 FINAL  Final  Culture, blood (Routine x 2)     Status: None    Collection Time: 04/22/22  4:37 PM   Specimen: Left Antecubital; Blood  Result Value Ref Range Status   Specimen Description LEFT ANTECUBITAL  Final   Special Requests   Final    BOTTLES DRAWN AEROBIC AND ANAEROBIC Blood Culture adequate volume   Culture   Final    NO GROWTH 5 DAYS Performed at [facility], [address] [place], [place] [address]    Report Status 04/27/2022 FINAL  Final  Urine Culture     Status: None   Collection Time: 04/22/22  6:46 PM   Specimen: Urine, Clean Catch  Result Value Ref Range Status   Specimen Description   Final    URINE, CLEAN CATCH Performed at [facility], [address] [place], [place] [address]    Special Requests   Final    NONE Performed at [facility], [address] [place], [place] [address]    Culture   Final    NO GROWTH Performed at [facility], [place] [address], [place], [place] [address]    Report Status 04/24/2022 FINAL  Final  MRSA Next Gen by PCR, Nasal     Status: Abnormal   Collection Time: 04/23/22 11:55 AM   Specimen: Nasal Mucosa; Nasal Swab  Result Value Ref Range Status   MRSA by PCR Next Gen DETECTED (A) NOT DETECTED Final    Comment: RESULT CALLED TO, READ BACK BY AND VERIFIED WITH: JENNIFER KING @ [id number] ON 04/23/22 C VARNER (NOTE) The GeneXpert MRSA Assay (FDA approved for NASAL specimens only), is one component of a comprehensive MRSA colonization surveillance program. It is not intended to diagnose MRSA infection nor to guide or monitor treatment for MRSA infections. Test performance is not FDA approved in patients less than 18 years old. Performed at [facility], [address], [place], [place] [address]      Scheduled Meds:  amitriptyline  25 mg Oral QHS   aspirin EC  81 mg Oral Q breakfast   furosemide  80 mg Intravenous BID   gabapentin  600 mg Oral BID   heparin  5,000 Units Subcutaneous Q8H   insulin aspart  0-15 Units Subcutaneous TID WC   insulin aspart  0-5 Units Subcutaneous QHS    insulin aspart protamine- aspart  8 Units Subcutaneous BID WC   levothyroxine  Shane mcg Oral Q0600   metoprolol succinate  12.5 mg Oral BID   mometasone-formoterol  2 puff Inhalation BID   mupirocin ointment   Nasal BID   pantoprazole  40 mg Oral Daily  potassium chloride  40 mEq Oral BID   tamsulosin  0.8 mg Oral Daily   venlafaxine XR  225 mg Oral Daily   Continuous Infusions:  cefTRIAXone (ROCEPHIN)  IV 2 g (04/28/22 1717)   vancomycin 1,750 mg (04/28/22 1801)    Procedures/Studies: [place] LT LOWER EXTREM LTD SOFT TISSUE NON VASCULAR  Result Date: 04/25/2022 CLINICAL DATA:  Bilateral soft tissue ultrasound EXAM: ULTRASOUND bilateral LOWER EXTREMITY LIMITED TECHNIQUE: Ultrasound examination of the lower extremity soft tissues was performed in the area of clinical concern. COMPARISON:  None Available. FINDINGS: Targeted ultrasound was performed of the bilateral anterior lower legs and left medial dorsal foot. Significant soft tissue edema is seen with no evidence of organized fluid collection. IMPRESSION: Soft tissue edema, compatible with history of cellulitis. No evidence of organized fluid collection. Electronically Signed   By: Yetta Glassman M.D.   On: 04/25/2022 11:48   [place] RT LOWER EXTREM LTD SOFT TISSUE NON VASCULAR  Result Date: 04/25/2022 CLINICAL DATA:  Bilateral soft tissue ultrasound EXAM: ULTRASOUND bilateral LOWER EXTREMITY LIMITED TECHNIQUE: Ultrasound examination of the lower extremity soft tissues was performed in the area of clinical concern. COMPARISON:  None Available. FINDINGS: Targeted ultrasound was performed of the bilateral anterior lower legs and left medial dorsal foot. Significant soft tissue edema is seen with no evidence of organized fluid collection. IMPRESSION: Soft tissue edema, compatible with history of cellulitis. No evidence of organized fluid collection. Electronically Signed   By: Yetta Glassman M.D.   On: 04/25/2022 11:48   DG HIPS BILAT WITH PELVIS MIN  5 VIEWS  Result Date: 04/23/2022 CLINICAL DATA:  Bilateral hip pain. EXAM: DG HIP (WITH OR WITHOUT PELVIS) 5+V BILAT COMPARISON:  None Available. FINDINGS: Both hip joint spaces are preserved. There is bilateral acetabular spurring. Femoral heads are well seated. No fracture, erosion, or bony destruction. Intact pubic rami. Pubic symphysis and sacroiliac joints are congruent. Prominent vascular calcifications. IMPRESSION: Mild degenerative acetabular spurring of both hips. Electronically Signed   By: Keith Rake M.D.   On: 04/23/2022 18:42   US Venous Img Lower Unilateral Left (DVT)  Result Date: 04/23/2022 CLINICAL DATA:  62 year old with left lower extremity swelling. EXAM: LEFT LOWER EXTREMITY VENOUS DOPPLER ULTRASOUND TECHNIQUE: Gray-scale sonography with graded compression, as well as color Doppler and duplex ultrasound were performed to evaluate the lower extremity deep venous systems from the level of the common femoral vein and including the common femoral, femoral, profunda femoral, popliteal and calf veins including the posterior tibial, peroneal and gastrocnemius veins when visible. The superficial great saphenous vein was also interrogated. Spectral Doppler was utilized to evaluate flow at rest and with distal augmentation maneuvers in the common femoral, femoral and popliteal veins. COMPARISON:  Venous ultrasound 11/23/2020 FINDINGS: Contralateral Common Femoral Vein: Respiratory phasicity is normal and symmetric with the symptomatic side. No evidence of thrombus. Normal compressibility. Common Femoral Vein: No evidence of thrombus. Normal compressibility, respiratory phasicity and response to augmentation. Saphenofemoral Junction: No evidence of thrombus. Normal compressibility and flow on color Doppler imaging. Profunda Femoral Vein: No evidence of thrombus. Normal compressibility and flow on color Doppler imaging. Femoral Vein: No evidence of thrombus. Normal compressibility, respiratory  phasicity and response to augmentation. Popliteal Vein: No evidence of thrombus. Normal compressibility, respiratory phasicity and response to augmentation. Calf Veins: Visualized left deep calf veins are patent without thrombus. Other Findings:  None. IMPRESSION: Negative for deep venous thrombosis in left lower extremity. Electronically Signed   By: Scherrie Gerlach.D.  On: 04/23/2022 11:57   DG Chest Port 1 View  Result Date: 04/22/2022 CLINICAL DATA:  Sepsis. EXAM: PORTABLE CHEST 1 VIEW COMPARISON:  Chest x-ray 11/22/2020 FINDINGS: Median sternotomy wires are present. Lung volumes are low. The heart size and mediastinal contours are within normal limits. Both lungs are clear. The visualized skeletal structures are unremarkable. IMPRESSION: No active disease. Electronically Signed   By: Ronney Asters M.D.   On: 04/22/2022 17:14    Orson Eva, DO  [facility]  If 7PM-7AM, please contact night-coverage [email or web address] Password [email or web address] 04/29/2022, 11:20 AM   LOS: 7 days

## 2022-04-29 NOTE — Consult Note (Signed)
[facility] Consult  Reason for Consult: Worsening left lower extremity bullous cellulitis Referring Physician: Dr. Starla Link  Chief Complaint   Cellulitis     HPI: Shane Chambers is a 61 y.o. male who was admitted to the hospital on 6/13 with bilateral lower extremity cellulitis.  Over the last couple of days, his right lower extremity cellulitis has improved, but his left lower extremity cellulitis has continued to progress.  He now has bullae on the top of his foot and the bottom of his foot.  He states that he has been in the hospital for different times for treatment for cellulitis, but he believes that this is the worst bout of cellulitis as he has had.  He does confirm chronic wounds on his bilateral lower extremities.  He has never had any surgical procedures on either lower extremity.  His surgical history significant for a quadruple bypass in 2021.  He denies use of any blood thinning medications at this time.  His past medical history is significant for coronary artery disease, hypertension, hyperlipidemia, diabetes, and peripheral vascular disease.  Past Medical History:  Diagnosis Date   Anemia    Arthritis    CAD (coronary artery disease)    a. s/p CABG in 03/2020 with LIMA-LAD, RIMA-PL, [id number]   Cancer ([place])    rectal   Cellulitis    COPD (chronic obstructive pulmonary disease) ([place])    Depression    Diabetes mellitus without complication (HCC)    GERD (gastroesophageal reflux disease)    History of kidney stones    Hyperlipidemia 12/01/2019   Hypertension    Hypothyroidism    Ischemic cardiomyopathy    a. EF 20-25% by echo in 02/2020 b. at 40% by echo in 03/2020 c. EF normalized to 60-65% by echo in 04/2020   Myocardial infarction [facility])    Peripheral vascular disease ([place])    Sleep apnea    Type 2 diabetes mellitus ([place])     Past Surgical History:  Procedure Laterality Date   BIOPSY  07/18/2021   Procedure: BIOPSY;  Surgeon: Eloise Harman, DO;  [facility];  Service: Endoscopy;;   BIOPSY  01/09/2022   Procedure: BIOPSY;  Surgeon: Eloise Harman, DO;  [facility];  Service: Endoscopy;;   COLONOSCOPY WITH PROPOFOL N/A 07/18/2021   [place]: 15 millimeter polyp removed from the sigmoid colon, 5 mm polyp removed from sigmoid colon.  Nonbleeding internal hemorrhoids.  Significant looping of the colon. sigmoid path showed invasive colonic adenocarcinoma involving tubular adenoma (invades to depth of 54m, carcinoma 170mfrom margin, no lymphovascular invasion, no poorly differentiated component.   CORONARY ARTERY BYPASS GRAFT N/A 03/13/2020   Procedure: CORONARY ARTERY BYPASS GRAFTING (CABG) times five using bilateral Internal mammary arteries and left radial artery;  Surgeon: AtWonda OldsMD;  [facility] Service: Open Heart Surgery;  Laterality: N/A;   DENTAL SURGERY     ESOPHAGOGASTRODUODENOSCOPY (EGD) WITH PROPOFOL N/A 07/18/2021   Carver: 1 gastric polyp status post biopsy, gastritis. gastric bx with slight chronic inflammation and no H.pyori. GEJ polypectomy with mild inflammation only   FLEXIBLE SIGMOIDOSCOPY N/A 08/26/2021   Carver: Nonbleeding internal hemorrhoids.  15 mm ulcers from previous polypectomy found in the rectum.  No evidence of previous polyp.  Located 5 to 8 cm from anal verge.   FLEXIBLE SIGMOIDOSCOPY N/A 01/09/2022   Procedure: FLEXIBLE SIGMOIDOSCOPY;  Surgeon: CaEloise HarmanDO;  [facility];  Service: Endoscopy;  Laterality: N/A;   POLYPECTOMY  07/18/2021   Procedure: POLYPECTOMY INTESTINAL;  Surgeon: Eloise Harman, DO;  [facility];  Service: Endoscopy;;   RADIAL ARTERY HARVEST Left 03/13/2020   Procedure: [place],;  Surgeon: [name], MD;  Location: [place];  Service: Open Heart Surgery;  Laterality: Left;   RIGHT/LEFT HEART CATH AND CORONARY ANGIOGRAPHY N/A 03/07/2020   Procedure: RIGHT/LEFT HEART CATH AND CORONARY  ANGIOGRAPHY;  Surgeon: Sherren Mocha, MD;  Location: [place] [facility];  Service: Cardiovascular;  Laterality: N/A;   SUBMUCOSAL LIFTING INJECTION  01/09/2022   Procedure: SUBMUCOSAL LIFTING INJECTION;  Surgeon: Eloise Harman, DO;  [facility];  Service: Endoscopy;;   SUBMUCOSAL TATTOO INJECTION  01/09/2022   Procedure: SUBMUCOSAL TATTOO INJECTION;  Surgeon: Eloise Harman, DO;  [facility];  Service: Endoscopy;;   TEE WITHOUT CARDIOVERSION N/A 03/13/2020   Procedure: TRANSESOPHAGEAL ECHOCARDIOGRAM (TEE);  Surgeon: [name], MD;  Location: [place];  Service: Open Heart Surgery;  Laterality: N/A;    Family History  Problem Relation Age of Onset   Hypertension Mother     Social History   Tobacco Use   Smoking status: Former    Packs/day: 1.50    Types: Cigarettes    Quit date: 05/25/1995    Years since quitting: 26.9   Smokeless tobacco: Never  Vaping Use   Vaping Use: Never used  Substance Use Topics   Alcohol use: Not Currently    Comment: rarely   Drug use: No    Medications: I have reviewed the patient's current medications.  Allergies  Allergen Reactions   Ace Inhibitors Swelling and Cough    (Not on MAR at [facility] and [place])   Other Itching    [name]   Penicillins     Has patient had a PCN reaction causing immediate rash, facial/tongue/throat swelling, SOB or lightheadedness with hypotension: Unknown Has patient had a PCN reaction causing severe rash involving mucus membranes or skin necrosis: Unknown Has patient had a PCN reaction that required hospitalization: Unknown Has patient had a PCN reaction occurring within the last 10 years: Unknown If all of the above answers are "NO", then may proceed with Cephalosporin use.      ROS:  Constitutional: negative for chills, fatigue, and fevers Respiratory: negative for shortness of breath Cardiovascular: negative for chest pain Gastrointestinal:  negative for abdominal pain, nausea, and vomiting Integument/breast: positive for cellulitis of bilateral lower extremities, with blisters on his left leg Musculoskeletal:positive for bilateral lower extremity pain  Blood pressure (!) 154/73, pulse 80, temperature 98.6 F (37 C), resp. rate (!) 21, height '5\' 9"'$  (1.753 m), weight 129.8 kg, SpO2 96 %. Physical Exam Vitals reviewed.  Constitutional:      Appearance: Normal appearance.  HENT:     Head: Normocephalic and atraumatic.  Eyes:     Extraocular Movements: Extraocular movements intact.     Pupils: Pupils are equal, round, and reactive to light.  Cardiovascular:     Rate and Rhythm: Normal rate.  Pulmonary:     Effort: Pulmonary effort is normal.  Abdominal:     General: There is no distension.     Palpations: Abdomen is soft.     Tenderness: There is no abdominal tenderness.  Musculoskeletal:        General: Normal range of motion.     Cervical back: Normal range of motion.  Skin:    Comments: Right lower  extremity with mild erythema and edema; left lower extremity with significant erythema, warmth, and edema; skin sloughing on dorsal and plantar aspects of the foot, significant edema of the skin without any underlying pus or ischemia  Neurological:     General: No focal deficit present.     Mental Status: He is alert and oriented to person, place, and time.  Psychiatric:        Mood and Affect: Mood normal.        Behavior: Behavior normal.        Results: Results for orders placed or performed during the hospital encounter of 04/22/22 (from the past 48 hour(s))  Glucose, capillary     Status: Abnormal   Collection Time: 04/27/22  4:00 PM  Result Value Ref Range   Glucose-Capillary 182 (H) 70 - 99 mg/dL    Comment: Glucose reference range applies only to samples taken after fasting for at least 8 hours.  Glucose, capillary     Status: Abnormal   Collection Time: 04/27/22 10:28 PM  Result Value Ref Range    Glucose-Capillary 212 (H) 70 - 99 mg/dL    Comment: Glucose reference range applies only to samples taken after fasting for at least 8 hours.  CBC with Differential/Platelet     Status: Abnormal   Collection Time: 04/28/22  4:34 AM  Result Value Ref Range   WBC 17.3 (H) 4.0 - 10.5 K/uL   RBC 2.78 (L) 4.22 - 5.81 MIL/uL   Hemoglobin 8.2 (L) 13.0 - 17.0 g/dL   HCT 25.1 (L) 39.0 - 52.0 %   MCV 90.3 80.0 - 100.0 fL   MCH 29.5 26.0 - 34.0 pg   MCHC 32.7 30.0 - 36.0 g/dL   RDW 13.8 11.5 - 15.5 %   Platelets 385 150 - 400 K/uL   nRBC 0.0 0.0 - 0.2 %   Neutrophils Relative % 70 %   Neutro Abs 12.1 (H) 1.7 - 7.7 K/uL   Lymphocytes Relative 8 %   Lymphs Abs 1.3 0.7 - 4.0 K/uL   Monocytes Relative 10 %   Monocytes Absolute 1.8 (H) 0.1 - 1.0 K/uL   Eosinophils Relative 2 %   Eosinophils Absolute 0.4 0.0 - 0.5 K/uL   Basophils Relative 1 %   Basophils Absolute 0.1 0.0 - 0.1 K/uL   RBC Morphology MORPHOLOGY UNREMARKABLE    Smear Review MORPHOLOGY UNREMARKABLE    Immature Granulocytes 9 %   Abs Immature Granulocytes 1.60 (H) 0.00 - 0.07 K/uL    Comment: Performed at [facility], [address], [place], [place] [address]  Basic metabolic panel     Status: Abnormal   Collection Time: 04/28/22  4:34 AM  Result Value Ref Range   Sodium 136 135 - 145 mmol/L   Potassium 3.4 (L) 3.5 - 5.1 mmol/L   Chloride 104 98 - 111 mmol/L   CO2 24 22 - 32 mmol/L   Glucose, Bld 138 (H) 70 - 99 mg/dL    Comment: Glucose reference range applies only to samples taken after fasting for at least 8 hours.   BUN 27 (H) 6 - 20 mg/dL   Creatinine, Ser 1.22 0.61 - 1.24 mg/dL   Calcium 8.0 (L) 8.9 - 10.3 mg/dL   GFR, Estimated >60 >60 mL/min    Comment: (NOTE) Calculated using the CKD-EPI Creatinine Equation (2021)    Anion gap 8 5 - 15    Comment: Performed at [facility], [address] [place], [place] [address]  Magnesium  Status: Abnormal   Collection Time: 04/28/22  4:34 AM  Result Value Ref Range    Magnesium 1.6 (L) 1.7 - 2.4 mg/dL    Comment: Performed at [facility], [address], [place], [place] [address]  C-reactive protein     Status: Abnormal   Collection Time: 04/28/22  4:34 AM  Result Value Ref Range   CRP 33.6 (H) <1.0 mg/dL    Comment: Performed at [place] [address], [place], [place] [address]  Glucose, capillary     Status: Abnormal   Collection Time: 04/28/22  7:05 AM  Result Value Ref Range   Glucose-Capillary 129 (H) 70 - 99 mg/dL    Comment: Glucose reference range applies only to samples taken after fasting for at least 8 hours.  Glucose, capillary     Status: Abnormal   Collection Time: 04/28/22 11:23 AM  Result Value Ref Range   Glucose-Capillary 141 (H) 70 - 99 mg/dL    Comment: Glucose reference range applies only to samples taken after fasting for at least 8 hours.  Glucose, capillary     Status: Abnormal   Collection Time: 04/28/22  4:18 PM  Result Value Ref Range   Glucose-Capillary 196 (H) 70 - 99 mg/dL    Comment: Glucose reference range applies only to samples taken after fasting for at least 8 hours.  Glucose, capillary     Status: Abnormal   Collection Time: 04/28/22  9:08 PM  Result Value Ref Range   Glucose-Capillary 167 (H) 70 - 99 mg/dL    Comment: Glucose reference range applies only to samples taken after fasting for at least 8 hours.   Comment 1 Notify RN    Comment 2 Document in Chart   CBC with Differential/Platelet     Status: Abnormal   Collection Time: 04/29/22  4:53 AM  Result Value Ref Range   WBC 19.4 (H) 4.0 - 10.5 K/uL   RBC 2.79 (L) 4.22 - 5.81 MIL/uL   Hemoglobin 8.2 (L) 13.0 - 17.0 g/dL   HCT 25.7 (L) 39.0 - 52.0 %   MCV 92.1 80.0 - 100.0 fL   MCH 29.4 26.0 - 34.0 pg   MCHC 31.9 30.0 - 36.0 g/dL   RDW 13.9 11.5 - 15.5 %   Platelets 419 (H) 150 - 400 K/uL   nRBC 0.0 0.0 - 0.2 %   Neutrophils Relative % 75 %   Neutro Abs 14.9 (H) 1.7 - 7.7 K/uL   Band Neutrophils 2 %   Lymphocytes Relative 5 %    Lymphs Abs 1.0 0.7 - 4.0 K/uL   Monocytes Relative 9 %   Monocytes Absolute 1.7 (H) 0.1 - 1.0 K/uL   Eosinophils Relative 1 %   Eosinophils Absolute 0.2 0.0 - 0.5 K/uL   Basophils Relative 1 %   Basophils Absolute 0.2 (H) 0.0 - 0.1 K/uL   WBC Morphology TOXIC GRANULATION    RBC Morphology MORPHOLOGY UNREMARKABLE    Smear Review MORPHOLOGY UNREMARKABLE    Metamyelocytes Relative 5 %   Myelocytes 2 %   Abs Immature Granulocytes 1.40 (H) 0.00 - 0.07 K/uL    Comment: Performed at [facility], [address], [place], [place] [address]  Basic metabolic panel     Status: Abnormal   Collection Time: 04/29/22  4:53 AM  Result Value Ref Range   Sodium 134 (L) 135 - 145 mmol/L   Potassium 3.7 3.5 - 5.1 mmol/L   Chloride 104 98 - 111 mmol/L  CO2 22 22 - 32 mmol/L   Glucose, Bld 171 (H) 70 - 99 mg/dL    Comment: Glucose reference range applies only to samples taken after fasting for at least 8 hours.   BUN 28 (H) 6 - 20 mg/dL   Creatinine, Ser 1.21 0.61 - 1.24 mg/dL   Calcium 8.2 (L) 8.9 - 10.3 mg/dL   GFR, Estimated >60 >60 mL/min    Comment: (NOTE) Calculated using the CKD-EPI Creatinine Equation (2021)    Anion gap 8 5 - 15    Comment: Performed at [facility], [address] [place], [place] [address]  Magnesium     Status: Abnormal   Collection Time: 04/29/22  4:53 AM  Result Value Ref Range   Magnesium 1.6 (L) 1.7 - 2.4 mg/dL    Comment: Performed at [facility], [address] [place], [place] [address]  CK     Status: None   Collection Time: 04/29/22  4:53 AM  Result Value Ref Range   Total CK 58 49 - 397 U/L    Comment: Performed at [facility], [address] [place], [place] [address]  Procalcitonin - Baseline     Status: None   Collection Time: 04/29/22  4:53 AM  Result Value Ref Range   Procalcitonin 0.87 ng/mL    Comment:        Interpretation: PCT > 0.5 ng/mL and <= 2 ng/mL: Systemic infection (sepsis) is possible, but other conditions are known to  elevate PCT as well. (NOTE)       Sepsis PCT Algorithm           Lower Respiratory Tract                                      Infection PCT Algorithm    ----------------------------     ----------------------------         PCT < 0.25 ng/mL                PCT < 0.10 ng/mL          Strongly encourage             Strongly discourage   discontinuation of antibiotics    initiation of antibiotics    ----------------------------     -----------------------------       PCT 0.25 - 0.50 ng/mL            PCT 0.10 - 0.25 ng/mL               OR       >80% decrease in PCT            Discourage initiation of                                            antibiotics      Encourage discontinuation           of antibiotics    ----------------------------     -----------------------------         PCT >= 0.50 ng/mL              PCT 0.26 - 0.50 ng/mL                AND       <80% decrease in  PCT             Encourage initiation of                                             antibiotics       Encourage continuation           of antibiotics    ----------------------------     -----------------------------        PCT >= 0.50 ng/mL                  PCT > 0.50 ng/mL               AND         increase in PCT                  Strongly encourage                                      initiation of antibiotics    Strongly encourage escalation           of antibiotics                                     -----------------------------                                           PCT <= 0.25 ng/mL                                                 OR                                        > 80% decrease in PCT                                      Discontinue / Do not initiate                                             antibiotics  Performed at [facility], [address] [place], [place] [address]   Glucose, capillary     Status: Abnormal   Collection Time: 04/29/22  7:11 AM  Result Value Ref Range    Glucose-Capillary 134 (H) 70 - 99 mg/dL    Comment: Glucose reference range applies only to samples taken after fasting for at least 8 hours.  Glucose, capillary     Status: Abnormal   Collection Time: 04/29/22 11:08 AM  Result Value Ref Range   Glucose-Capillary 180 (H) 70 - 99 mg/dL    Comment: Glucose reference range applies only to samples taken after fasting for at least 8  hours.  Urinalysis, Complete w Microscopic     Status: Abnormal   Collection Time: 04/29/22 11:37 AM  Result Value Ref Range   Color, Urine YELLOW YELLOW   APPearance CLEAR CLEAR   Specific Gravity, Urine 1.012 1.005 - 1.030   pH 5.0 5.0 - 8.0   Glucose, UA NEGATIVE NEGATIVE mg/dL   Hgb urine dipstick SMALL (A) NEGATIVE   Bilirubin Urine NEGATIVE NEGATIVE   Ketones, ur NEGATIVE NEGATIVE mg/dL   Protein, ur 100 (A) NEGATIVE mg/dL   Nitrite NEGATIVE NEGATIVE   Leukocytes,Ua NEGATIVE NEGATIVE   RBC / HPF 0-5 0 - 5 RBC/hpf   WBC, UA 0-5 0 - 5 WBC/hpf   Bacteria, UA RARE (A) NONE SEEN   Mucus PRESENT     Comment: Performed at [facility], [address] [place], [place] [address]    No results found.   Assessment & Plan:  Shane Chambers is a 61 y.o. male who was admitted with bilateral lower extremity cellulitis.  He is worsening left lower extremity bullous cellulitis.  Leukocytosis of 19.4 today.  -Given that the patient's left lower extremity cellulitis has failed to improve with antibiotics, recommend further imaging evaluation with either CT or MRI -MRI of the left lower extremity ordered by Dr. Carles Collet -No abscess noted on the plantar aspect of the left foot.  I debrided some of his sloughing skin to verify there was no underlying abscess.  His skin and soft tissues were just extremely edematous without purulent drainage -Continue with daily and as needed dressing changes to left lower extremity.  I replaced the dressing with Xeroform, 4x4's, ABD pads to the heel and base of the foot, and wrapped with  Kerlix -Appreciate wound care recommendations -Continue IV antibiotics per primary team.  Patient may require change in antibiotics given failure of his left lower extremity cellulitis to respond appropriately -Continue pain control -Further recommendations to follow imaging  All questions were answered to the satisfaction of the patient.  -- Graciella Freer, DO [facility] [address] [place], [place] [address] [phone] (office)

## 2022-04-29 NOTE — Plan of Care (Signed)
  Problem: Clinical Measurements: Goal: Ability to avoid or minimize complications of infection will improve Outcome: Progressing   Problem: Skin Integrity: Goal: Skin integrity will improve Outcome: Progressing   Problem: Education: Goal: Knowledge of General Education information will improve Description: Including pain rating scale, medication(s)/side effects and non-pharmacologic comfort measures Outcome: Progressing   Problem: Health Behavior/Discharge Planning: Goal: Ability to manage health-related needs will improve Outcome: Progressing   

## 2022-04-29 NOTE — Consult Note (Addendum)
[place] Nurse wound follow up Refer to previous [place] consults on 6/16 and 6/19.  Surgical team was consulted today and performed bedside debridement of loose nonviable tissue; refer to their progress notes. MRI results are pending to R/O infectious process to LLE.  Dressing procedure/placement/frequency: Topical treatment orders have previously been provided for bedside nurses to perform as follows: Wound care to BLEs:  Wash BLEs with soap and water, rinse and pat gently dry. Cover affected areas in region with folded layers of xeroform gauze Kellie Simmering [phone]). Top with dry gauze, ABDs pad for comfort and secure with a few turns of Kerlix roll gauze/paper tape. Elevate LEs on pillows while in bed, float heels using Prevalon Boots. Please re-consult if further assistance is needed.  Thank-you,  Julien Girt MSN, [place], [place], [place], [place]

## 2022-04-30 ENCOUNTER — Ambulatory Visit (HOSPITAL_COMMUNITY): Payer: Medicare Other | Admitting: Hematology

## 2022-04-30 DIAGNOSIS — R6520 Severe sepsis without septic shock: Secondary | ICD-10-CM | POA: Diagnosis not present

## 2022-04-30 DIAGNOSIS — D638 Anemia in other chronic diseases classified elsewhere: Secondary | ICD-10-CM | POA: Diagnosis not present

## 2022-04-30 DIAGNOSIS — L03115 Cellulitis of right lower limb: Secondary | ICD-10-CM | POA: Diagnosis not present

## 2022-04-30 DIAGNOSIS — L03116 Cellulitis of left lower limb: Secondary | ICD-10-CM | POA: Diagnosis not present

## 2022-04-30 DIAGNOSIS — N179 Acute kidney failure, unspecified: Secondary | ICD-10-CM | POA: Diagnosis not present

## 2022-04-30 DIAGNOSIS — A419 Sepsis, unspecified organism: Secondary | ICD-10-CM | POA: Diagnosis not present

## 2022-04-30 LAB — URINE CULTURE: Culture: NO GROWTH

## 2022-04-30 LAB — BASIC METABOLIC PANEL
Anion gap: 8 (ref 5–15)
BUN: 26 mg/dL — ABNORMAL HIGH (ref 6–20)
CO2: 23 mmol/L (ref 22–32)
Calcium: 8.4 mg/dL — ABNORMAL LOW (ref 8.9–10.3)
Chloride: 102 mmol/L (ref 98–111)
Creatinine, Ser: 0.98 mg/dL (ref 0.61–1.24)
GFR, Estimated: >60 mL/min (ref >60)
Glucose, Bld: 124 mg/dL — ABNORMAL HIGH (ref 70–99)
Potassium: 4.4 mmol/L (ref 3.5–5.1)
Sodium: 133 mmol/L — ABNORMAL LOW (ref 135–145)

## 2022-04-30 LAB — CBC
HCT: 25.9 % — ABNORMAL LOW (ref 39.0–52.0)
Hemoglobin: 8.6 g/dL — ABNORMAL LOW (ref 13.0–17.0)
MCH: 29.7 pg (ref 26.0–34.0)
MCHC: 33.2 g/dL (ref 30.0–36.0)
MCV: 89.3 fL (ref 80.0–100.0)
Platelets: 470 10*3/uL — ABNORMAL HIGH (ref 150–400)
RBC: 2.9 MIL/uL — ABNORMAL LOW (ref 4.22–5.81)
RDW: 13.7 % (ref 11.5–15.5)
WBC: 19.3 10*3/uL — ABNORMAL HIGH (ref 4.0–10.5)
nRBC: 0 % (ref 0.0–0.2)

## 2022-04-30 LAB — GLUCOSE, CAPILLARY
Glucose-Capillary: 141 mg/dL — ABNORMAL HIGH (ref 70–99)
Glucose-Capillary: 186 mg/dL — ABNORMAL HIGH (ref 70–99)

## 2022-04-30 MED ORDER — SILVER SULFADIAZINE 1 % EX CREA
TOPICAL_CREAM | Freq: Every day | CUTANEOUS | Status: DC
  Filled 2022-04-30: qty 85

## 2022-04-30 NOTE — Progress Notes (Signed)
PROGRESS NOTE  Shane Chambers MEQ:[id number] DOB: [birth year] DOA: 04/22/2022 PCP: Caprice Renshaw, MD  Brief History:  61 y.o. male with medical history significant for diabetes mellitus, chronic systolic and diastolic CHF, depression, CABG x5, COPD, OSA, rectal cancer presented with bilateral lower extremity pain, swelling and redness.  On presentation, he was hypotensive with systolic blood pressure in the 80s with WBC of 13.9, lactic acid of 4.1 and subsequently 2.6; high-sensitivity troponin 110 and 116 subsequently and creatinine elevated at 2.45.  UA was not suggestive of UTI.  Chest x-ray showed no acute abnormality.  He was started on IV fluids and antibiotics.  The patient has been on ceftriaxone and vancomycin since 04/22/2022.  In the last 72 hours, the patient's WBC count has gradually increased.  It appears that his left leg erythema, edema and pain have not much.  The patient remains afebrile and hemodynamically stable.  MRI of the left lower extremity was ordered on 04/29/2022.  Repeat wound care consult was ordered after new photos were taken on 04/29/2022.  Assessment and Plan: Severe sepsis: Present on admission Bilateral lower leg cellulitis Lactic acidosis Leukocytosis -Presented with hypotension, leukocytosis, lactic acidosis, acute kidney injury secondary to possible bilateral lower extremity cellulitis.  UA and chest x-ray not suggestive of infection. -Blood pressure improved.  6/13  Blood cultures negative. -Patient has been on vancomycin and ceftriaxone since 04/22/2022 -Left lower extremity venous Doppler was negative for DVT -WBCs worsened in the last 72 hours ultrasound showed no organized fluid collection and was compatible with cellulitis -Repeat wound care consult was ordered after new photos were taken on 04/29/2022. -MRI left lower extremity -d/c vanc, continue IV zyvox   Acute on chronic renal failure--CKD stage IIIa -Recent baseline of 1.1-1.4.  Presented  with creatinine of 2.45.  Creatinine improving to 1.22 today.  Treated with IV fluids and discontinued on 04/24/2022.  Currently still appears volume overloaded: Continue IV Lasix 80 mg every 12 hours.  Repeat a.m. labs.  Losartan and torsemide on hold.   Elevated troponin History of CAD/CABG -High-sensitivity troponins 110 and 116 subsequently on presentation. -from demand ischemia from sepsis and hypotension.   -No chest pain; EKG without any acute abnormalities.  No further work-up needed -Continue aspirin.  Beta-blocker resumed on 04/24/2022.  Blood pressures have improved.   Acute on chronic systolic and diastolic CHF -Strict input and output.  Daily weights.  Last echo in 2021 had shown EF of 55 to 60% with grade 2 diastolic dysfunction. -Continue Lasix 80 mg IV twice daily   Diabetes mellitus type 2 -Blood sugars stable.  Continue lower-dose of 70/30 -CBGs largely controlled  Hypertension -Patient was hypotensive on presentation.  Blood pressure improving.  Torsemide and losartan on hold.  Continue metoprolol.  Lasix plan as above.  BPH -Continue tamsulosin -Patient needed straight in and out cath 6/19  Continue the same if patient has recurrence of retention.  Outpatient follow-up with urology  Depression -Continue amitriptyline and venlafaxine   anemia of chronic disease -From chronic illnesses.  Hemoglobin currently stable   Hyponatremia -Improved   Hypomagnesemia -replete  Hypokalemia -Replace.  Repeat a.m. labs   Rectal cancer -Follows with Dr. Delton Coombes.  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  Currently under surveillance.  Outpatient follow-up with oncology.   Morbid obesity -BMI 42.26 -Lifestyle modification   Physical deconditioning -Patient is from SNF and will have to return back to SNF.  Family  Communication:   no Family at bedside  Consultants:  none  Code Status:  FULL   DVT Prophylaxis:  [place] Heparin    Procedures: As  Listed in Progress Note Above  Antibiotics: Ceftriaxone 6/13>> Vanc 6/13>>6/20 Zyvox 6/20>>  Subjective: No specific complaints.  Objective: Vitals:   04/30/22 0504 04/30/22 0705 04/30/22 0751 04/30/22 1400  BP:   138/65 (!) 142/61  Pulse:   85 74  Resp:    18  Temp:   99.2 F (37.3 C) 98.2 F (36.8 C)  TempSrc:   Oral Oral  SpO2:  98% 94% 98%  Weight: 126.8 kg     Height:        Intake/Output Summary (Last 24 hours) at 04/30/2022 1748 Last data filed at 04/30/2022 1717 Gross per 24 hour  Intake 823.75 ml  Output 2500 ml  Net -1676.25 ml   Weight change: -3 kg Exam:  General:  Pt is alert, follows commands appropriately, not in acute distress HEENT: No icterus, No thrush, No neck mass, [place]/AT Cardiovascular: RRR, S1/S2, no rubs, no gallops Respiratory: Bibasilar crackles.  No wheezing.  Air movement Abdomen: Soft/+BS, non tender, non distended, no guarding Extremities: sloughing skin seen left leg and foot.     Data Reviewed: I have personally reviewed following labs and imaging studies Basic Metabolic Panel: Recent Labs  Lab 04/25/22 0455 04/26/22 0535 04/27/22 0605 04/28/22 0434 04/29/22 0453 04/30/22 0611  NA 135 137 135 136 134* 133*  K 3.8 3.6 3.4* 3.4* 3.7 4.4  CL 104 104 104 104 104 102  CO2 21* '22 23 24 22 23  '$ GLUCOSE 95 121* 138* 138* 171* 124*  BUN 41* 35* 29* 27* 28* 26*  CREATININE 1.40* 1.23 1.18 1.22 1.21 0.98  CALCIUM 7.7* 7.9* 8.0* 8.0* 8.2* 8.4*  MG 1.3* 1.6* 1.4* 1.6* 1.6*  --    Liver Function Tests: Recent Labs  Lab 04/24/22 0526 04/26/22 0535  AST 46* 21  ALT 25 19  ALKPHOS 97 132*  BILITOT 0.7 1.0  PROT 6.4* 6.3*  ALBUMIN 2.7* 2.3*   No results for input(s): "LIPASE", "AMYLASE" in the last 168 hours. No results for input(s): "AMMONIA" in the last 168 hours. Coagulation Profile: No results for input(s): "INR", "PROTIME" in the last 168 hours.  CBC: Recent Labs  Lab 04/25/22 0455 04/26/22 0535 04/27/22 0605  04/28/22 0434 04/29/22 0453 04/30/22 0611  WBC 20.5* 16.4* 15.8* 17.3* 19.4* 19.3*  NEUTROABS 17.7* 13.0* 12.6* 12.1* 14.9*  --   HGB 9.2* 8.7* 8.6* 8.2* 8.2* 8.6*  HCT 29.0* 26.7* 26.1* 25.1* 25.7* 25.9*  MCV 91.8 90.8 89.7 90.3 92.1 89.3  PLT 252 287 320 385 419* 470*   Cardiac Enzymes: Recent Labs  Lab 04/29/22 0453  CKTOTAL 58   BNP: Invalid input(s): "POCBNP" CBG: Recent Labs  Lab 04/29/22 1108 04/29/22 1605 04/29/22 2031 04/30/22 0732 04/30/22 1154  GLUCAP 180* 141* 206* 141* 186*   HbA1C: No results for input(s): "HGBA1C" in the last 72 hours. Urine analysis:    Component Value Date/Time   COLORURINE YELLOW 04/29/2022 1137   APPEARANCEUR CLEAR 04/29/2022 1137   LABSPEC 1.012 04/29/2022 1137   PHURINE 5.0 04/29/2022 1137   GLUCOSEU NEGATIVE 04/29/2022 1137   HGBUR SMALL (A) 04/29/2022 1137   BILIRUBINUR NEGATIVE 04/29/2022 1137   KETONESUR NEGATIVE 04/29/2022 1137   PROTEINUR 100 (A) 04/29/2022 1137   NITRITE NEGATIVE 04/29/2022 1137   LEUKOCYTESUR NEGATIVE 04/29/2022 1137   Sepsis Labs: '@LABRCNTIP'$ (procalcitonin:4,lacticidven:4) ) Recent Results (from the  past 240 hour(s))  Culture, blood (Routine x 2)     Status: None   Collection Time: 04/22/22  4:37 PM   Specimen: Right Antecubital; Blood  Result Value Ref Range Status   Specimen Description RIGHT ANTECUBITAL  Final   Special Requests   Final    BOTTLES DRAWN AEROBIC AND ANAEROBIC Blood Culture results may not be optimal due to an inadequate volume of blood received in culture bottles   Culture   Final    NO GROWTH 5 DAYS Performed at [facility], [address], [place], [place] [address]    Report Status 04/27/2022 FINAL  Final  Culture, blood (Routine x 2)     Status: None   Collection Time: 04/22/22  4:37 PM   Specimen: Left Antecubital; Blood  Result Value Ref Range Status   Specimen Description LEFT ANTECUBITAL  Final   Special Requests   Final    BOTTLES DRAWN AEROBIC AND ANAEROBIC  Blood Culture adequate volume   Culture   Final    NO GROWTH 5 DAYS Performed at [facility], [address], [place], [place] [address]    Report Status 04/27/2022 FINAL  Final  Urine Culture     Status: None   Collection Time: 04/22/22  6:46 PM   Specimen: Urine, Clean Catch  Result Value Ref Range Status   Specimen Description   Final    URINE, CLEAN CATCH Performed at [facility], [address], [place], [place] [address]    Special Requests   Final    NONE Performed at [facility], [address], [place], [place] [address]    Culture   Final    NO GROWTH Performed at [facility], [place] [address] [place], [place] [address]    Report Status 04/24/2022 FINAL  Final  MRSA Next Gen by PCR, Nasal     Status: Abnormal   Collection Time: 04/23/22 11:55 AM   Specimen: Nasal Mucosa; Nasal Swab  Result Value Ref Range Status   MRSA by PCR Next Gen DETECTED (A) NOT DETECTED Final    Comment: RESULT CALLED TO, READ BACK BY AND VERIFIED WITH: JENNIFER KING @ 6226 ON 04/23/22 C VARNER (NOTE) The GeneXpert MRSA Assay (FDA approved for NASAL specimens only), is one component of a comprehensive MRSA colonization surveillance program. It is not intended to diagnose MRSA infection nor to guide or monitor treatment for MRSA infections. Test performance is not FDA approved in patients less than 8 years old. Performed at [facility], [address], [place], [place] [address]   Urine Culture     Status: None   Collection Time: 04/29/22 11:37 AM   Specimen: Urine, Clean Catch  Result Value Ref Range Status   Specimen Description   Final    URINE, CLEAN CATCH Performed at [facility], [address] [place], [place] [address]    Special Requests   Final    NONE Performed at [facility], [address] [place], [place] [address]    Culture   Final    NO GROWTH Performed at [facility], [place] [address] [place], [place] [address]    Report Status 04/30/2022 FINAL   Final  Culture, blood (Routine X 2) w Reflex to ID Panel     Status: None (Preliminary result)   Collection Time: 04/29/22  1:00 PM   Specimen: Left Antecubital; Blood  Result Value Ref Range Status   Specimen Description LEFT ANTECUBITAL  Final  Special Requests   Final    BOTTLES DRAWN AEROBIC AND ANAEROBIC Blood Culture adequate volume   Culture   Final    NO GROWTH 1 DAY Performed at [facility], [address], [place], [place] [address]    Report Status PENDING  Incomplete  Culture, blood (Routine X 2) w Reflex to ID Panel     Status: None (Preliminary result)   Collection Time: 04/29/22  1:00 PM   Specimen: BLOOD LEFT HAND  Result Value Ref Range Status   Specimen Description BLOOD LEFT HAND  Final   Special Requests   Final    BOTTLES DRAWN AEROBIC ONLY Blood Culture adequate volume   Culture   Final    NO GROWTH 1 DAY Performed at [facility], [address] [place], [place] [address]    Report Status PENDING  Incomplete     Scheduled Meds:  amitriptyline  25 mg Oral QHS   aspirin EC  81 mg Oral Q breakfast   furosemide  80 mg Intravenous BID   gabapentin  600 mg Oral BID   heparin  5,000 Units Subcutaneous Q8H   insulin aspart  0-15 Units Subcutaneous TID WC   insulin aspart  0-5 Units Subcutaneous QHS   insulin aspart protamine- aspart  8 Units Subcutaneous BID WC   levothyroxine  75 mcg Oral Q0600   metoprolol succinate  12.5 mg Oral BID   mometasone-formoterol  2 puff Inhalation BID   mupirocin ointment   Nasal BID   pantoprazole  40 mg Oral Daily   potassium chloride  40 mEq Oral BID   silver sulfADIAZINE   Topical Daily   tamsulosin  0.8 mg Oral Daily   venlafaxine XR  225 mg Oral Daily   Continuous Infusions:  cefTRIAXone (ROCEPHIN)  IV 200 mL/hr at 04/30/22 1717   linezolid (ZYVOX) IV Stopped (04/30/22 1046)    Procedures/Studies: MR FOOT LEFT W WO CONTRAST  Result Date: 04/29/2022 CLINICAL DATA:  Non diabetic. Foot swelling. Osteomyelitis  suspected. Cellulitis of the foot. Concerned about soft tissue abscess. Wound on top and bottom of left foot onto left lower leg for 2 months. EXAM: MRI OF THE LEFT FOREFOOT WITHOUT AND WITH CONTRAST TECHNIQUE: Multiplanar, multisequence MR imaging of the left hindfoot and midfoot was performed both before and after administration of intravenous contrast. Images were performed of the foot from the level of the posterior calcaneus through the distal metatarsals on sagittal and coronal images and the entire phalanges on axial images. CONTRAST:  46m GADAVIST GADOBUTROL 1 MMOL/ML IV SOLN COMPARISON:  None Available. FINDINGS: Bones/Joint/Cartilage There appears to be cartilage thinning and subchondral cystic change and peripheral osteophytosis indicating osteoarthritis that is severe at the third moderate to severe at the fourth, moderate at the second, and mild-to-moderate at the fifth tarsometatarsal joints. Mild-to-moderate calcaneocuboid cartilage thinning. Moderate tibiotalar and posterior subtalar joint effusions. No cortical erosion.  No marrow edema. Ligaments The Lisfranc ligament complex appears intact. No high-grade ankle ligament tear is visualized. Muscles and Tendons No gross large tear is visualized within the peroneus longus peroneus brevis and posterior tibial, flexor digitorum longus, flexor hallucis longus, tibialis anterior, extensor hallucis longus the extensor digitorum longus tendons. There is moderate edema and enhancement throughout the plantar greater than dorsal intrinsic foot musculature, nonspecific myositis. Soft tissues There is a decreased T1 and increased T2 signal walled-off likely ganglion with mild internal decreased T2 complexity and no definite internal enhancement suggesting a 2.0 cm ganglion extending medially from the  midfoot, possibly the inferior medial aspect of the calcaneocuboid joint (axial series 3, image 21 sagittal series 7 image 11, coronal series 5, image 17).  Moderate to high-grade dorsal foot subcutaneous fat edema and swelling. IMPRESSION: 1. Moderate to high-grade subcutaneous fat edema and swelling consistent with reported cellulitis. 2. 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 3. Moderate edema and enhancement throughout the plantar greater than dorsal intrinsic foot musculature, nonspecific myositis. 4. No cortical erosion is seen to indicate radiographic evidence of acute osteomyelitis. 5. Mildly complex ganglion measuring up to approximately 2 cm likely extending medially from the inferior aspect of the calcaneocuboid articulation. Electronically Signed   By: Yvonne Kendall M.D.   On: 04/29/2022 19:01   MR TIBIA FIBULA LEFT W WO CONTRAST  Result Date: 04/29/2022 CLINICAL DATA:  Soft tissue infection suspected of left lower leg. Cellulitis. No improvement with antibiotics. Open wound on top and bottom of left foot onto lower leg for 2 months. History of diabetes. No known injury. EXAM: MRI OF LOWER LEFT EXTREMITY WITHOUT AND WITH CONTRAST TECHNIQUE: Multiplanar, multisequence MR imaging of the left tibia and fibula was performed both before and after administration of intravenous contrast. CONTRAST:  3m GADAVIST GADOBUTROL 1 MMOL/ML IV SOLN COMPARISON:  Bilateral lower extremity ultrasound demonstrating soft tissue edema 04/25/2022; FINDINGS: Bones/Joint/Cartilage Normal marrow signal without edema or acute fracture. The bone cortices appear intact. Moderate to high-grade peripheral posteromedial femoral condyle and more mild-to-moderate posterolateral femoral condyle degenerative osteophytes. Mild degenerative/chronic cystic change within the anterior patella just distal to the quadriceps tendon insertion chronic stress related change. Moderate inferior patellar degenerative osteophytosis. The left knee is included on large field-of-view coronal images and there is severe lateral and moderate medial compartment osteoarthritis. Ligaments The medial  and lateral collateral ligaments of the knee appear intact. Muscles and Tendons Mild-to-moderate edema is seen throughout the posterior deep and posterior superficial muscle compartments especially the proximal distal soleus, mid to distal tibialis anterior and extensor digitorum and extensor hallucis longus and distal posterior tibial muscles. There is moderate diffuse feathery fatty infiltration of the soleus and gastrocnemius muscles and more mild fatty infiltration of the deep posterior anterior and lateral muscle compartments. Soft tissues Partially visualized likely moderate knee joint effusion. Moderate subcutaneous fat edema and swelling diffusely throughout the left calf. Mild fluid at the far deep aspect of the medial anterior and lateral subcutaneous fat. No walled-off fluid collection or rim enhancing abscess is seen. On large field-of-view images including the left lower extremity, there is similar moderate subcutaneous fat edema and swelling and left calf muscle swelling. IMPRESSION: 1. Moderate subcutaneous fat edema and swelling consistent with cellulitis. 2. Mild-to-moderate calf muscle edema greatest within the posterior superficial and posterior deep compartments. This is consistent with nonspecific myositis. No significant fluid is seen tracking along the fascial planes. 3. On large field-of-view coronal images, there also appears to be similar edema and swelling of the left calf subcutaneous fat. Left calf muscle edema is slightly less than the right side. 4. No cortical erosion or MRI evidence of acute osteomyelitis. 5. 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 Electronically Signed   By: RYvonne KendallM.D.   On: 04/29/2022 18:44   [place] LOWER EXTREM LTD SOFT TISSUE NON VASCULAR  Result Date: 04/25/2022 CLINICAL DATA:  Bilateral soft tissue ultrasound EXAM:  ULTRASOUND bilateral LOWER EXTREMITY LIMITED TECHNIQUE: Ultrasound examination of the lower extremity  soft tissues was performed in the area of clinical concern. COMPARISON:  None Available. FINDINGS: Targeted ultrasound was performed of the bilateral anterior lower legs and left medial dorsal foot. Significant soft tissue edema is seen with no evidence of organized fluid collection. IMPRESSION: Soft tissue edema, compatible with history of cellulitis. No evidence of organized fluid collection. Electronically Signed   By: Yetta Glassman M.D.   On: 04/25/2022 11:48   [place] RT LOWER EXTREM LTD SOFT TISSUE NON VASCULAR  Result Date: 04/25/2022 CLINICAL DATA:  Bilateral soft tissue ultrasound EXAM: ULTRASOUND bilateral LOWER EXTREMITY LIMITED TECHNIQUE: Ultrasound examination of the lower extremity soft tissues was performed in the area of clinical concern. COMPARISON:  None Available. FINDINGS: Targeted ultrasound was performed of the bilateral anterior lower legs and left medial dorsal foot. Significant soft tissue edema is seen with no evidence of organized fluid collection. IMPRESSION: Soft tissue edema, compatible with history of cellulitis. No evidence of organized fluid collection. Electronically Signed   By: Yetta Glassman M.D.   On: 04/25/2022 11:48   DG HIPS BILAT WITH PELVIS MIN 5 VIEWS  Result Date: 04/23/2022 CLINICAL DATA:  Bilateral hip pain. EXAM: DG HIP (WITH OR WITHOUT PELVIS) 5+V BILAT COMPARISON:  None Available. FINDINGS: Both hip joint spaces are preserved. There is bilateral acetabular spurring. Femoral heads are well seated. No fracture, erosion, or bony destruction. Intact pubic rami. Pubic symphysis and sacroiliac joints are congruent. Prominent vascular calcifications. IMPRESSION: Mild degenerative acetabular spurring of both hips. Electronically Signed   By: Keith Rake M.D.   On: 04/23/2022 18:42   US Venous Img Lower Unilateral Left (DVT)  Result Date: 04/23/2022 CLINICAL  DATA:  61 year old with left lower extremity swelling. EXAM: LEFT LOWER EXTREMITY VENOUS DOPPLER ULTRASOUND TECHNIQUE: Gray-scale sonography with graded compression, as well as color Doppler and duplex ultrasound were performed to evaluate the lower extremity deep venous systems from the level of the common femoral vein and including the common femoral, femoral, profunda femoral, popliteal and calf veins including the posterior tibial, peroneal and gastrocnemius veins when visible. The superficial great saphenous vein was also interrogated. Spectral Doppler was utilized to evaluate flow at rest and with distal augmentation maneuvers in the common femoral, femoral and popliteal veins. COMPARISON:  Venous ultrasound 11/23/2020 FINDINGS: Contralateral Common Femoral Vein: Respiratory phasicity is normal and symmetric with the symptomatic side. No evidence of thrombus. Normal compressibility. Common Femoral Vein: No evidence of thrombus. Normal compressibility, respiratory phasicity and response to augmentation. Saphenofemoral Junction: No evidence of thrombus. Normal compressibility and flow on color Doppler imaging. Profunda Femoral Vein: No evidence of thrombus. Normal compressibility and flow on color Doppler imaging. Femoral Vein: No evidence of thrombus. Normal compressibility, respiratory phasicity and response to augmentation. Popliteal Vein: No evidence of thrombus. Normal compressibility, respiratory phasicity and response to augmentation. Calf Veins: Visualized left deep calf veins are patent without thrombus. Other Findings:  None. IMPRESSION: Negative for deep venous thrombosis in left lower extremity. Electronically Signed   By: Markus Daft M.D.   On: 04/23/2022 11:57   DG Chest Port 1 View  Result Date: 04/22/2022 CLINICAL DATA:  Sepsis. EXAM: PORTABLE CHEST 1 VIEW COMPARISON:  Chest x-ray 11/22/2020 FINDINGS: Median sternotomy wires are present. Lung volumes are low. The heart size and mediastinal  contours are within normal limits. Both lungs are clear. The visualized skeletal structures are unremarkable. IMPRESSION: No active disease. Electronically Signed   By: Ronney Asters M.D.   On: 04/22/2022 17:14    Santia Labate Wynetta Emery,  MD  [facility]  If 7PM-7AM, please contact night-coverage [email or web address] Password [facility] 04/30/2022, 5:48 PM   LOS: 8 days

## 2022-04-30 NOTE — Plan of Care (Signed)

## 2022-04-30 NOTE — Progress Notes (Signed)
Patient got up and sat in the recliner this am. Once he got back in the bed wound care was performed on his LLE. Patient has been sleeping comfortably for most of the afternoon.

## 2022-04-30 NOTE — Progress Notes (Signed)
[facility] Progress Note     Subjective: Patient seen and examined.  He is sitting in the bed.  He only complains of pain in his left lower extremity.  He is tolerating a diet without nausea and vomiting.  Objective: Vital signs in last 24 hours: Temp:  [97 F (36.1 C)-99.2 F (37.3 C)] 99.2 F (37.3 C) (06/21 0751) Pulse Rate:  [73-85] 85 (06/21 0751) Resp:  [14-19] 18 (06/21 0408) BP: (128-147)/(59-68) 138/65 (06/21 0751) SpO2:  [94 %-98 %] 94 % (06/21 0751) Weight:  [126.8 kg] 126.8 kg (06/21 0504) Last BM Date : 04/26/22  Intake/Output from previous day: 06/20 0701 - 06/21 0700 In: 1260 [P.O.:960; IV Piggyback:300] Out: 3800 [Urine:3800] Intake/Output this shift: Total I/O In: 240 [P.O.:240] Out: -   General appearance: alert, cooperative, and no distress GI: soft, non-tender; bowel sounds normal; no masses,  no organomegaly Skin: Right lower extremity with mild erythema and edema; left lower extremity with significant erythema, warmth, and edema; skin sloughing on dorsal and plantar aspects of foot, no evidence of infection or ischemia       Lab Results:  Recent Labs    04/29/22 0453 04/30/22 0611  WBC 19.4* 19.3*  HGB 8.2* 8.6*  HCT 25.7* 25.9*  PLT 419* 470*   BMET Recent Labs    04/29/22 0453 04/30/22 0611  NA 134* 133*  K 3.7 4.4  CL 104 102  CO2 22 23  GLUCOSE 171* 124*  BUN 28* 26*  CREATININE 1.21 0.98  CALCIUM 8.2* 8.4*   PT/INR No results for input(s): "LABPROT", "INR" in the last 72 hours.  Studies/Results: MR FOOT LEFT W WO CONTRAST  Result Date: 04/29/2022 CLINICAL DATA:  Non diabetic. Foot swelling. Osteomyelitis suspected. Cellulitis of the foot. Concerned about soft tissue abscess. Wound on top and bottom of left foot onto left lower leg for 2 months. EXAM: MRI OF THE LEFT FOREFOOT WITHOUT AND WITH CONTRAST TECHNIQUE: Multiplanar, multisequence MR imaging of the left hindfoot and midfoot was performed both  before and after administration of intravenous contrast. Images were performed of the foot from the level of the posterior calcaneus through the distal metatarsals on sagittal and coronal images and the entire phalanges on axial images. CONTRAST:  75m GADAVIST GADOBUTROL 1 MMOL/ML IV SOLN COMPARISON:  None Available. FINDINGS: Bones/Joint/Cartilage There appears to be cartilage thinning and subchondral cystic change and peripheral osteophytosis indicating osteoarthritis that is severe at the third moderate to severe at the fourth, moderate at the second, and mild-to-moderate at the fifth tarsometatarsal joints. Mild-to-moderate calcaneocuboid cartilage thinning. Moderate tibiotalar and posterior subtalar joint effusions. No cortical erosion.  No marrow edema. Ligaments The Lisfranc ligament complex appears intact. No high-grade ankle ligament tear is visualized. Muscles and Tendons No gross large tear is visualized within the peroneus longus peroneus brevis and posterior tibial, flexor digitorum longus, flexor hallucis longus, tibialis anterior, extensor hallucis longus the extensor digitorum longus tendons. There is moderate edema and enhancement throughout the plantar greater than dorsal intrinsic foot musculature, nonspecific myositis. Soft tissues There is a decreased T1 and increased T2 signal walled-off likely ganglion with mild internal decreased T2 complexity and no definite internal enhancement suggesting a 2.0 cm ganglion extending medially from the midfoot, possibly the inferior medial aspect of the calcaneocuboid joint (axial series 3, image 21 sagittal series 7 image 11, coronal series 5, image 17). Moderate to high-grade dorsal foot subcutaneous fat edema and swelling. IMPRESSION: 1. Moderate to high-grade subcutaneous fat edema and swelling consistent  with reported cellulitis. 2. 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 3. Moderate edema and enhancement throughout the plantar greater than  dorsal intrinsic foot musculature, nonspecific myositis. 4. No cortical erosion is seen to indicate radiographic evidence of acute osteomyelitis. 5. Mildly complex ganglion measuring up to approximately 2 cm likely extending medially from the inferior aspect of the calcaneocuboid articulation. Electronically Signed   By: Yvonne Kendall M.D.   On: 04/29/2022 19:01   MR TIBIA FIBULA LEFT W WO CONTRAST  Result Date: 04/29/2022 CLINICAL DATA:  Soft tissue infection suspected of left lower leg. Cellulitis. No improvement with antibiotics. Open wound on top and bottom of left foot onto lower leg for 2 months. History of diabetes. No known injury. EXAM: MRI OF LOWER LEFT EXTREMITY WITHOUT AND WITH CONTRAST TECHNIQUE: Multiplanar, multisequence MR imaging of the left tibia and fibula was performed both before and after administration of intravenous contrast. CONTRAST:  59m GADAVIST GADOBUTROL 1 MMOL/ML IV SOLN COMPARISON:  Bilateral lower extremity ultrasound demonstrating soft tissue edema 04/25/2022; FINDINGS: Bones/Joint/Cartilage Normal marrow signal without edema or acute fracture. The bone cortices appear intact. Moderate to high-grade peripheral posteromedial femoral condyle and more mild-to-moderate posterolateral femoral condyle degenerative osteophytes. Mild degenerative/chronic cystic change within the anterior patella just distal to the quadriceps tendon insertion chronic stress related change. Moderate inferior patellar degenerative osteophytosis. The left knee is included on large field-of-view coronal images and there is severe lateral and moderate medial compartment osteoarthritis. Ligaments The medial and lateral collateral ligaments of the knee appear intact. Muscles and Tendons Mild-to-moderate edema is seen throughout the posterior deep and posterior superficial muscle compartments especially the proximal distal soleus, mid to distal tibialis anterior and extensor digitorum and extensor hallucis  longus and distal posterior tibial muscles. There is moderate diffuse feathery fatty infiltration of the soleus and gastrocnemius muscles and more mild fatty infiltration of the deep posterior anterior and lateral muscle compartments. Soft tissues Partially visualized likely moderate knee joint effusion. Moderate subcutaneous fat edema and swelling diffusely throughout the left calf. Mild fluid at the far deep aspect of the medial anterior and lateral subcutaneous fat. No walled-off fluid collection or rim enhancing abscess is seen. On large field-of-view images including the left lower extremity, there is similar moderate subcutaneous fat edema and swelling and left calf muscle swelling. IMPRESSION: 1. Moderate subcutaneous fat edema and swelling consistent with cellulitis. 2. Mild-to-moderate calf muscle edema greatest within the posterior superficial and posterior deep compartments. This is consistent with nonspecific myositis. No significant fluid is seen tracking along the fascial planes. 3. On large field-of-view coronal images, there also appears to be similar edema and swelling of the left calf subcutaneous fat. Left calf muscle edema is slightly less than the right side. 4. No cortical erosion or MRI evidence of acute osteomyelitis. 5. 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 Electronically Signed   By: RYvonne KendallM.D.   On: 04/29/2022 18:44    Anti-infectives: Anti-infectives (From admission, onward)    Start     Dose/Rate Route Frequency Ordered Stop   04/30/22 1800  cefTRIAXone (ROCEPHIN) 2 g in sodium chloride 0.9 % 100 mL IVPB        2 g 200 mL/hr over 30 Minutes Intravenous Every 24 hours 04/29/22 1852     04/29/22 1945  linezolid (ZYVOX) IVPB 600 mg        600 mg 300 mL/hr over  60 Minutes Intravenous 2 times daily 04/29/22 1852     04/26/22 1800   vancomycin (VANCOREADY) IVPB 1750 mg/350 mL  Status:  Discontinued        1,750 mg 175 mL/hr over 120 Minutes Intravenous Every 24 hours 04/26/22 0908 04/29/22 1852   04/24/22 2200  vancomycin (VANCOREADY) IVPB 1250 mg/250 mL  Status:  Discontinued        1,250 mg 166.7 mL/hr over 90 Minutes Intravenous Every 24 hours 04/24/22 0913 04/26/22 0908   04/23/22 2200  vancomycin (VANCOCIN) IVPB 1000 mg/200 mL premix  Status:  Discontinued        1,000 mg 200 mL/hr over 60 Minutes Intravenous Every 24 hours 04/22/22 2118 04/24/22 0913   04/23/22 1800  cefTRIAXone (ROCEPHIN) 2 g in sodium chloride 0.9 % 100 mL IVPB        2 g 200 mL/hr over 30 Minutes Intravenous Every 24 hours 04/22/22 2355 04/29/22 2000   04/22/22 2230  cefTRIAXone (ROCEPHIN) 1 g in sodium chloride 0.9 % 100 mL IVPB       Note to Pharmacy: Totaling 2g, adjusted for weight.   1 g 200 mL/hr over 30 Minutes Intravenous  Once 04/22/22 2223 04/22/22 2317   04/22/22 1815  vancomycin (VANCOREADY) IVPB 2000 mg/400 mL        2,000 mg 200 mL/hr over 120 Minutes Intravenous  Once 04/22/22 1804 04/22/22 2058   04/22/22 1800  cefTRIAXone (ROCEPHIN) 1 g in sodium chloride 0.9 % 100 mL IVPB        1 g 200 mL/hr over 30 Minutes Intravenous  Once 04/22/22 1757 04/22/22 1851       Assessment/Plan:  Patient is a 61 year old male who was admitted with bilateral lower extremity cellulitis.  He has worsening left lower extremity bullous cellulitis.  -Leukocytosis stable, 19.3 from 19.4 yesterday -MRI of the left lower extremity and foot demonstrates significant subcutaneous edema consistent with cellulitis in addition to nonspecific myositis.  No evidence of abscess or subcutaneous emphysema -Given that there is no concern for underlying fluid collections, no indication for debridement at this time -Given the significant wound on his left foot, I have ordered Silvadene to be applied to the foot wounds with dressing changes -Continue daily  dressing changes -Antibiotics changed yesterday by primary team, currently on Rocephin and linezolid -Care per primary team -Will follow the patient peripherally.  Please call with any questions or concerns.   LOS: 8 days    Darianny Momon A Lujain Kraszewski 04/30/2022

## 2022-05-01 DIAGNOSIS — L03115 Cellulitis of right lower limb: Secondary | ICD-10-CM | POA: Diagnosis not present

## 2022-05-01 DIAGNOSIS — L03116 Cellulitis of left lower limb: Secondary | ICD-10-CM | POA: Diagnosis not present

## 2022-05-01 DIAGNOSIS — A419 Sepsis, unspecified organism: Secondary | ICD-10-CM | POA: Diagnosis not present

## 2022-05-01 DIAGNOSIS — R6520 Severe sepsis without septic shock: Secondary | ICD-10-CM | POA: Diagnosis not present

## 2022-05-01 LAB — GLUCOSE, CAPILLARY
Glucose-Capillary: 146 mg/dL — ABNORMAL HIGH (ref 70–99)
Glucose-Capillary: 166 mg/dL — ABNORMAL HIGH (ref 70–99)
Glucose-Capillary: 121 mg/dL — ABNORMAL HIGH (ref 70–99)
Glucose-Capillary: 165 mg/dL — ABNORMAL HIGH (ref 70–99)

## 2022-05-01 MED ORDER — SACCHAROMYCES BOULARDII 250 MG PO CAPS
250.0000 mg | ORAL_CAPSULE | Freq: Two times a day (BID) | ORAL | Status: DC
  Administered 2022-05-01 – 2022-05-02 (×2): 250 mg via ORAL
  Filled 2022-05-01 (×2): qty 1

## 2022-05-01 MED ORDER — MORPHINE SULFATE (PF) 4 MG/ML IV SOLN
4.0000 mg | Freq: Once | INTRAVENOUS | Status: AC
  Administered 2022-05-01: 4 mg via INTRAVENOUS
  Filled 2022-05-01: qty 1

## 2022-05-01 MED ORDER — LOSARTAN POTASSIUM 50 MG PO TABS
50.0000 mg | ORAL_TABLET | Freq: Every day | ORAL | Status: DC
  Administered 2022-05-01 – 2022-05-02 (×2): 50 mg via ORAL
  Filled 2022-05-01 (×2): qty 1

## 2022-05-01 NOTE — Progress Notes (Signed)
Physical Therapy Treatment Patient Details Name: ADRIANO BISCHOF MRN: [id number] DOB: 1961-08-02 Today's Date: 05/01/2022   History of Present Illness CAROLINE MATTERS is a 61 y.o. male with medical history significant for diabetes mellitus, systolic and diastolic CHF, depression, CABG x5, COPD OSA, rectal cancer.  Patient was brought to the ED reports of bilateral lower extremity pain swelling and redness.  Patient tells me he has had chronic pain in his lower extremities but the pain was worse yesterday.  Also reports feeling ill.  Denies vomiting, no loose stools no abdominal pain, denies chest pain, no cough no difficulty breathing.  No neck pain or stiffness, denies urinary symptoms.  Reports pain in his buttock area, but he denies any ulcers involving his back or buttock area.    PT Comments    Patient supine in bed upon therapist arrival. Patient agreeable to participating in bed level therapeutic exercises. Nursing reports patient received pain medication at 12:46 pm with treatment beginning at 3:15 pm. Patient reporting a 9/10 pain in left lower leg at rest at beginning of session. Trunk and lower extremity bed level exercises difficult for patient to perform especially limited by pain in the left lower extremity. Exercise program handout given to patient at end of session with instructions to perform 1-3 times per day for continued strengthening in preparation for more transfers and ambulation next session.  Patient would continue to benefit from skilled physical therapy in current environment and next venue to continue return to prior function and increase strength, endurance, balance, coordination, and functional mobility and gait skills.     Recommendations for follow up therapy are one component of a multi-disciplinary discharge planning process, led by the attending physician.  Recommendations may be updated based on patient status, additional functional criteria and insurance  authorization.  Follow Up Recommendations  Skilled nursing-short term rehab (<3 hours/day)     Assistance Recommended at Discharge Set up Supervision/Assistance  Patient can return home with the following A lot of help with walking and/or transfers;A little help with bathing/dressing/bathroom;Help with stairs or ramp for entrance;Assistance with cooking/housework   Equipment Recommendations  None recommended by PT    Recommendations for Other Services       Precautions / Restrictions Precautions Precautions: Fall Restrictions Weight Bearing Restrictions: No     Mobility  Bed Mobility        Transfers        Ambulation/Gait     Stairs       Wheelchair Mobility    Modified Rankin (Stroke Patients Only)       Balance          Cognition Arousal/Alertness: Awake/alert Behavior During Therapy: WFL for tasks assessed/performed Overall Cognitive Status: Within Functional Limits for tasks assessed          Exercises General Exercises - Lower Extremity Ankle Circles/Pumps: (P) AROM, Strengthening, Both, 10 reps, Supine Quad Sets: (P) Strengthening, Both, 10 reps, Supine Gluteal Sets: (P) Strengthening, Both, 10 reps, Supine Short Arc Quad: (P) AROM, Strengthening, Both, 10 reps, Supine Heel Slides: (P) AROM, Strengthening, 10 reps, Right, AAROM, Left, Supine Hip ABduction/ADduction: (P) Strengthening, Both, 10 reps, Seated Other Exercises Other Exercises: abdominal sit up sliding hands towards knee starting in hooklying x10    General Comments        Pertinent Vitals/Pain Pain Assessment Pain Assessment: 0-10 Pain Score: 9  Pain Location: left lower leg Pain Intervention(s): Limited activity within patient's tolerance, Monitored during session, Premedicated before session, Repositioned  Home Living         Prior Function            PT Goals (current goals can now be found in the care plan section) Acute Rehab PT Goals Patient  Stated Goal: return home PT Goal Formulation: With patient Time For Goal Achievement: 05/07/22 Potential to Achieve Goals: Good Progress towards PT goals: Progressing toward goals    Frequency    Min 3X/week      PT Plan Current plan remains appropriate       AM-PAC PT "6 Clicks" Mobility   Outcome Measure  Help needed turning from your back to your side while in a flat bed without using bedrails?: A Little Help needed moving from lying on your back to sitting on the side of a flat bed without using bedrails?: A Little Help needed moving to and from a bed to a chair (including a wheelchair)?: A Little Help needed standing up from a chair using your arms (e.g., wheelchair or bedside chair)?: A Little Help needed to walk in hospital room?: A Lot Help needed climbing 3-5 steps with a railing? : Total 6 Click Score: 15    End of Session   Activity Tolerance: Patient limited by pain Patient left: with call bell/phone within reach;in bed;with bed alarm set Nurse Communication: Mobility status PT Visit Diagnosis: Unsteadiness on feet (R26.81);Other abnormalities of gait and mobility (R26.89);Muscle weakness (generalized) (M62.81)     Time: 6384-6659 PT Time Calculation (min) (ACUTE ONLY): 28 min  Charges:  $Therapeutic Exercise: 23-37 mins                     Floria Raveling. Hartnett-Rands, MS, PT Per [place] [phone]  Pamala Hurry  Hartnett-Rands 05/01/2022, 3:52 PM

## 2022-05-01 NOTE — Progress Notes (Signed)
PROGRESS NOTE  Shane Chambers RAQ:[id number] DOB: [birth year] DOA: 04/22/2022 PCP: Caprice Renshaw, MD  Brief History:  61 y.o. male with medical history significant for diabetes mellitus, chronic systolic and diastolic CHF, depression, CABG x5, COPD, OSA, rectal cancer presented with bilateral lower extremity pain, swelling and redness.  On presentation, he was hypotensive with systolic blood pressure in the 80s with WBC of 13.9, lactic acid of 4.1 and subsequently 2.6; high-sensitivity troponin 110 and 116 subsequently and creatinine elevated at 2.45.  UA was not suggestive of UTI.  Chest x-ray showed no acute abnormality.  He was started on IV fluids and antibiotics.  The patient has been on ceftriaxone and vancomycin since 04/22/2022.  In the last 72 hours, the patient's WBC count has gradually increased.  It appears that his left leg erythema, edema and pain have not much.  The patient remains afebrile and hemodynamically stable.  MRI of the left lower extremity was ordered on 04/29/2022.  Repeat wound care consult was ordered after new photos were taken on 04/29/2022.  Assessment and Plan: Severe sepsis: Present on admission Bilateral lower leg cellulitis Lactic acidosis Leukocytosis -Presented with hypotension, leukocytosis, lactic acidosis, acute kidney injury secondary to possible bilateral lower extremity cellulitis.  UA and chest x-ray not suggestive of infection. -Blood pressure improved.  6/13  Blood cultures negative. -Patient has been on vancomycin and ceftriaxone since 04/22/2022 -Left lower extremity venous Doppler was negative for DVT -WBCs worsened in the last 72 hours ultrasound showed no organized fluid collection and was compatible with cellulitis -Repeat wound care consult was ordered after new photos were taken on 04/29/2022. -MRI left lower extremity -d/c vanc, continue IV zyvox   Acute on chronic renal failure--CKD stage IIIa -Recent baseline of 1.1-1.4.  Presented  with creatinine of 2.45.  Creatinine improving to 1.22 today.  Treated with IV fluids and discontinued on 04/24/2022.  Currently still appears volume overloaded: Continue IV Lasix 80 mg every 12 hours.  Repeat a.m. labs.  Losartan restarted as renal function now at baseline and torsemide on hold while he is on IV lasix.   Elevated troponin History of CAD/CABG -High-sensitivity troponins 110 and 116 subsequently on presentation. -from demand ischemia from sepsis and hypotension.   -No chest pain; EKG without any acute abnormalities.  No further work-up needed -Continue aspirin.  Beta-blocker resumed on 04/24/2022.  Blood pressures have improved.   Acute on chronic systolic and diastolic CHF -Strict input and output.  Daily weights.  Last echo in 2021 had shown EF of 55 to 60% with grade 2 diastolic dysfunction. -Continue Lasix 80 mg IV twice daily but planning to resume home torsemide at discharge    Diabetes mellitus type 2 -Blood sugars stable.  Continue lower-dose of 70/30 -CBGs  have been stable CBG (last 3)  Recent Labs    04/30/22 2159 05/01/22 0742 05/01/22 1131  GLUCAP 166* 121* 165*   Hypertension -Patient was hypotensive on presentation.  Blood pressure improving.  Torsemide and losartan on hold.  Continue metoprolol.  Lasix plan as above.  BPH -Continue tamsulosin -Patient needed straight in and out cath 6/19  Continue the same if patient has recurrence of retention.  Outpatient follow-up with urology  Depression -Continue amitriptyline and venlafaxine   anemia of chronic disease -From chronic illnesses.  Hemoglobin currently stable   Hyponatremia -Improved   Hypomagnesemia -replete  Hypokalemia -Repleted.    Rectal cancer -Follows with Dr. Delton Coombes.  Rectal adenocarcinoma  arising in a polyp, which was removed.  Currently under surveillance.  Outpatient follow-up with oncology.   Morbid obesity -BMI 42.26 -Lifestyle modification   Physical  deconditioning -Patient is from SNF and will have to return back to SNF. Plan to return to SNF on 6/23 if he continues to improve.    Family Communication:   no Family at bedside  Consultants:  none  Code Status:  FULL   DVT Prophylaxis:   Heparin    Procedures: As Listed in Progress Note Above  Antibiotics: Ceftriaxone 6/13>> Vanc 6/13>>6/20 Zyvox 6/20>>  Subjective: His left leg and foot seem to be much improved with new treatments.    Objective: Vitals:   04/30/22 2156 05/01/22 0439 05/01/22 0718 05/01/22 1100  BP: (!) 172/73 (!) 153/71  (!) 152/65  Pulse: 81 74  76  Resp: '20 19  18  '$ Temp: 98.5 F (36.9 C) 98 F (36.7 C)  98.2 F (36.8 C)  TempSrc: Oral   Oral  SpO2: 94% 92% 96% 93%  Weight:      Height:        Intake/Output Summary (Last 24 hours) at 05/01/2022 1645 Last data filed at 05/01/2022 0900 Gross per 24 hour  Intake 840.07 ml  Output 4025 ml  Net -3184.93 ml   Weight change:  Exam:  General:  Pt is alert, follows commands appropriately, not in acute distress HEENT: No icterus, No thrush, No neck mass, [place]/AT Cardiovascular: RRR, S1/S2, no rubs, no gallops Respiratory: Bibasilar crackles.  No wheezing.  Air movement Abdomen: Soft/+BS, non tender, non distended, no guarding Extremities: sloughing skin seen left leg and foot. Markedly improved redness and no drainage seen.   Data Reviewed: I have personally reviewed following labs and imaging studies Basic Metabolic Panel: Recent Labs  Lab 04/25/22 0455 04/26/22 0535 04/27/22 0605 04/28/22 0434 04/29/22 0453 04/30/22 0611  NA 135 137 135 136 134* 133*  K 3.8 3.6 3.4* 3.4* 3.7 4.4  CL 104 104 104 104 104 102  CO2 21* '22 23 24 22 23  '$ GLUCOSE 95 121* 138* 138* 171* 124*  BUN 41* 35* 29* 27* 28* 26*  CREATININE 1.40* 1.23 1.18 1.22 1.21 0.98  CALCIUM 7.7* 7.9* 8.0* 8.0* 8.2* 8.4*  MG 1.3* 1.6* 1.4* 1.6* 1.6*  --    Liver Function Tests: Recent Labs  Lab 04/26/22 0535  AST 21  ALT  19  ALKPHOS 132*  BILITOT 1.0  PROT 6.3*  ALBUMIN 2.3*   No results for input(s): "LIPASE", "AMYLASE" in the last 168 hours. No results for input(s): "AMMONIA" in the last 168 hours. Coagulation Profile: No results for input(s): "INR", "PROTIME" in the last 168 hours.  CBC: Recent Labs  Lab 04/25/22 0455 04/26/22 0535 04/27/22 0605 04/28/22 0434 04/29/22 0453 04/30/22 0611  WBC 20.5* 16.4* 15.8* 17.3* 19.4* 19.3*  NEUTROABS 17.7* 13.0* 12.6* 12.1* 14.9*  --   HGB 9.2* 8.7* 8.6* 8.2* 8.2* 8.6*  HCT 29.0* 26.7* 26.1* 25.1* 25.7* 25.9*  MCV 91.8 90.8 89.7 90.3 92.1 89.3  PLT 252 287 320 385 419* 470*   Cardiac Enzymes: Recent Labs  Lab 04/29/22 0453  CKTOTAL 58   BNP: Invalid input(s): "POCBNP" CBG: Recent Labs  Lab 04/30/22 1154 04/30/22 1637 04/30/22 2159 05/01/22 0742 05/01/22 1131  GLUCAP 186* 146* 166* 121* 165*   HbA1C: No results for input(s): "HGBA1C" in the last 72 hours. Urine analysis:    Component Value Date/Time   COLORURINE YELLOW 04/29/2022 [place]  04/29/2022 1137   LABSPEC 1.012 04/29/2022 1137   PHURINE 5.0 04/29/2022 1137   GLUCOSEU NEGATIVE 04/29/2022 1137   HGBUR SMALL (A) 04/29/2022 1137   BILIRUBINUR NEGATIVE 04/29/2022 1137   KETONESUR NEGATIVE 04/29/2022 1137   PROTEINUR 100 (A) 04/29/2022 1137   NITRITE NEGATIVE 04/29/2022 1137   LEUKOCYTESUR NEGATIVE 04/29/2022 1137   Recent Results (from the past 240 hour(s))  Culture, blood (Routine x 2)     Status: None   Collection Time: 04/22/22  4:37 PM   Specimen: Right Antecubital; Blood  Result Value Ref Range Status   Specimen Description RIGHT ANTECUBITAL  Final   Special Requests   Final    BOTTLES DRAWN AEROBIC AND ANAEROBIC Blood Culture results may not be optimal due to an inadequate volume of blood received in culture bottles   Culture   Final    NO GROWTH 5 DAYS Performed at [facility], [address], [place], [place] [address]    Report Status  04/27/2022 FINAL  Final  Culture, blood (Routine x 2)     Status: None   Collection Time: 04/22/22  4:37 PM   Specimen: Left Antecubital; Blood  Result Value Ref Range Status   Specimen Description LEFT ANTECUBITAL  Final   Special Requests   Final    BOTTLES DRAWN AEROBIC AND ANAEROBIC Blood Culture adequate volume   Culture   Final    NO GROWTH 5 DAYS Performed at [facility], [address], [place], [place] [address]    Report Status 04/27/2022 FINAL  Final  Urine Culture     Status: None   Collection Time: 04/22/22  6:46 PM   Specimen: Urine, Clean Catch  Result Value Ref Range Status   Specimen Description   Final    URINE, CLEAN CATCH Performed at [facility], [address] [place], [place] [address]    Special Requests   Final    NONE Performed at [facility], [address], [place], [place] [address]    Culture   Final    NO GROWTH Performed at [facility], [place] [address] [place], [place] [address]    Report Status 04/24/2022 FINAL  Final  MRSA Next Gen by PCR, Nasal     Status: Abnormal   Collection Time: 04/23/22 11:55 AM   Specimen: Nasal Mucosa; Nasal Swab  Result Value Ref Range Status   MRSA by PCR Next Gen DETECTED (A) NOT DETECTED Final    Comment: RESULT CALLED TO, READ BACK BY AND VERIFIED WITH: JENNIFER KING @ 1308 ON 04/23/22 C VARNER (NOTE) The GeneXpert MRSA Assay (FDA approved for NASAL specimens only), is one component of a comprehensive MRSA colonization surveillance program. It is not intended to diagnose MRSA infection nor to guide or monitor treatment for MRSA infections. Test performance is not FDA approved in patients less than 63 years old. Performed at [facility], [address] [place], [place] [address]   Urine Culture     Status: None   Collection Time: 04/29/22 11:37 AM   Specimen: Urine, Clean Catch  Result Value Ref Range Status   Specimen Description   Final    URINE, CLEAN CATCH Performed at [facility],  [address] [place], [place] [address]    Special Requests   Final    NONE Performed at [facility], [address], [place],  [address]    Culture   Final    NO GROWTH Performed at [facility], [address] [place], [place] [address]    Report Status 04/30/2022 FINAL  Final  Culture, blood (Routine X 2) w Reflex to ID Panel     Status: None (Preliminary result)   Collection Time: 04/29/22  1:00 PM   Specimen: Left Antecubital; Blood  Result Value Ref Range Status   Specimen Description LEFT ANTECUBITAL  Final   Special Requests   Final    BOTTLES DRAWN AEROBIC AND ANAEROBIC Blood Culture adequate volume   Culture   Final    NO GROWTH 2 DAYS Performed at [facility], [address] [place], [place] [address]    Report Status PENDING  Incomplete  Culture, blood (Routine X 2) w Reflex to ID Panel     Status: None (Preliminary result)   Collection Time: 04/29/22  1:00 PM   Specimen: BLOOD LEFT HAND  Result Value Ref Range Status   Specimen Description BLOOD LEFT HAND  Final   Special Requests   Final    BOTTLES DRAWN AEROBIC ONLY Blood Culture adequate volume   Culture   Final    NO GROWTH 2 DAYS Performed at [facility], [address], [name], [place] [address]    Report Status PENDING  Incomplete     Scheduled Meds:  amitriptyline  25 mg Oral QHS   aspirin EC  81 mg Oral Q breakfast   furosemide  80 mg Intravenous BID   gabapentin  600 mg Oral BID   heparin  5,000 Units Subcutaneous Q8H   insulin aspart  0-15 Units Subcutaneous TID WC   insulin aspart  0-5 Units Subcutaneous QHS   insulin aspart protamine- aspart  8 Units Subcutaneous BID WC   levothyroxine  75 mcg Oral Q0600   metoprolol succinate  12.5 mg Oral BID   mometasone-formoterol  2 puff Inhalation BID   mupirocin ointment   Nasal BID   pantoprazole  40 mg Oral Daily   potassium chloride  40 mEq Oral BID   saccharomyces boulardii  250 mg Oral BID   silver sulfADIAZINE   Topical Daily    tamsulosin  0.8 mg Oral Daily   venlafaxine XR  225 mg Oral Daily   Continuous Infusions:  cefTRIAXone (ROCEPHIN)  IV Stopped (04/30/22 1736)   linezolid (ZYVOX) IV 600 mg (05/01/22 0940)    Procedures/Studies: MR FOOT LEFT W WO CONTRAST  Result Date: 04/29/2022 CLINICAL DATA:  Non diabetic. Foot swelling. Osteomyelitis suspected. Cellulitis of the foot. Concerned about soft tissue abscess. Wound on top and bottom of left foot onto left lower leg for 2 months. EXAM: MRI OF THE LEFT FOREFOOT WITHOUT AND WITH CONTRAST TECHNIQUE: Multiplanar, multisequence MR imaging of the left hindfoot and midfoot was performed both before and after administration of intravenous contrast. Images were performed of the foot from the level of the posterior calcaneus through the distal metatarsals on sagittal and coronal images and the entire phalanges on axial images. CONTRAST:  28m GADAVIST GADOBUTROL 1 MMOL/ML IV SOLN COMPARISON:  None Available. FINDINGS: Bones/Joint/Cartilage There appears to be cartilage thinning and subchondral cystic change and peripheral osteophytosis indicating osteoarthritis that is severe at the third moderate to severe at the fourth, moderate at the second, and mild-to-moderate at the fifth tarsometatarsal joints. Mild-to-moderate calcaneocuboid cartilage thinning. Moderate tibiotalar and posterior subtalar joint effusions. No cortical erosion.  No marrow edema. Ligaments The Lisfranc ligament complex appears intact. No high-grade ankle ligament tear is visualized. Muscles and Tendons No gross large tear is visualized within the peroneus longus  peroneus brevis and posterior tibial, flexor digitorum longus, flexor hallucis longus, tibialis anterior, extensor hallucis longus the extensor digitorum longus tendons. There is moderate edema and enhancement throughout the plantar greater than dorsal intrinsic foot musculature, nonspecific myositis. Soft tissues There is a decreased T1 and increased T2  signal walled-off likely ganglion with mild internal decreased T2 complexity and no definite internal enhancement suggesting a 2.0 cm ganglion extending medially from the midfoot, possibly the inferior medial aspect of the calcaneocuboid joint (axial series 3, image 21 sagittal series 7 image 11, coronal series 5, image 17). Moderate to high-grade dorsal foot subcutaneous fat edema and swelling. IMPRESSION: 1. Moderate to high-grade subcutaneous fat edema and swelling consistent with reported cellulitis. 2. 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 3. Moderate edema and enhancement throughout the plantar greater than dorsal intrinsic foot musculature, nonspecific myositis. 4. No cortical erosion is seen to indicate radiographic evidence of acute osteomyelitis. 5. Mildly complex ganglion measuring up to approximately 2 cm likely extending medially from the inferior aspect of the calcaneocuboid articulation. Electronically Signed   By: Yvonne Kendall M.D.   On: 04/29/2022 19:01   MR TIBIA FIBULA LEFT W WO CONTRAST  Result Date: 04/29/2022 CLINICAL DATA:  Soft tissue infection suspected of left lower leg. Cellulitis. No improvement with antibiotics. Open wound on top and bottom of left foot onto lower leg for 2 months. History of diabetes. No known injury. EXAM: MRI OF LOWER LEFT EXTREMITY WITHOUT AND WITH CONTRAST TECHNIQUE: Multiplanar, multisequence MR imaging of the left tibia and fibula was performed both before and after administration of intravenous contrast. CONTRAST:  62m GADAVIST GADOBUTROL 1 MMOL/ML IV SOLN COMPARISON:  Bilateral lower extremity ultrasound demonstrating soft tissue edema 04/25/2022; FINDINGS: Bones/Joint/Cartilage Normal marrow signal without edema or acute fracture. The bone cortices appear intact. Moderate to high-grade peripheral posteromedial femoral condyle and more mild-to-moderate posterolateral femoral condyle degenerative osteophytes. Mild degenerative/chronic cystic  change within the anterior patella just distal to the quadriceps tendon insertion chronic stress related change. Moderate inferior patellar degenerative osteophytosis. The left knee is included on large field-of-view coronal images and there is severe lateral and moderate medial compartment osteoarthritis. Ligaments The medial and lateral collateral ligaments of the knee appear intact. Muscles and Tendons Mild-to-moderate edema is seen throughout the posterior deep and posterior superficial muscle compartments especially the proximal distal soleus, mid to distal tibialis anterior and extensor digitorum and extensor hallucis longus and distal posterior tibial muscles. There is moderate diffuse feathery fatty infiltration of the soleus and gastrocnemius muscles and more mild fatty infiltration of the deep posterior anterior and lateral muscle compartments. Soft tissues Partially visualized likely moderate knee joint effusion. Moderate subcutaneous fat edema and swelling diffusely throughout the left calf. Mild fluid at the far deep aspect of the medial anterior and lateral subcutaneous fat. No walled-off fluid collection or rim enhancing abscess is seen. On large field-of-view images including the left lower extremity, there is similar moderate subcutaneous fat edema and swelling and left calf muscle swelling. IMPRESSION: 1. Moderate subcutaneous fat edema and swelling consistent with cellulitis. 2. Mild-to-moderate calf muscle edema greatest within the posterior superficial and posterior deep compartments. This is consistent with nonspecific myositis. No significant fluid is seen tracking along the fascial planes. 3. On large field-of-view coronal images, there also appears to be similar edema and swelling of the left calf subcutaneous fat. Left calf muscle edema is slightly less than the right side. 4. No cortical erosion or MRI evidence of acute osteomyelitis. 5. 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 Electronically Signed   By: Yvonne Kendall M.D.   On: 04/29/2022 18:44   [place] LT LOWER EXTREM LTD SOFT TISSUE NON VASCULAR  Result Date: 04/25/2022 CLINICAL DATA:  Bilateral soft tissue ultrasound EXAM: ULTRASOUND bilateral LOWER EXTREMITY LIMITED TECHNIQUE: Ultrasound examination of the lower extremity soft tissues was performed in the area of clinical concern. COMPARISON:  None Available. FINDINGS: Targeted ultrasound was performed of the bilateral anterior lower legs and left medial dorsal foot. Significant soft tissue edema is seen with no evidence of organized fluid collection. IMPRESSION: Soft tissue edema, compatible with history of cellulitis. No evidence of organized fluid collection. Electronically Signed   By: Yetta Glassman M.D.   On: 04/25/2022 11:48   [place] RT LOWER EXTREM LTD SOFT TISSUE NON VASCULAR  Result Date: 04/25/2022 CLINICAL DATA:  Bilateral soft tissue ultrasound EXAM: ULTRASOUND bilateral LOWER EXTREMITY LIMITED TECHNIQUE: Ultrasound examination of the lower extremity soft tissues was performed in the area of clinical concern. COMPARISON:  None Available. FINDINGS: Targeted ultrasound was performed of the bilateral anterior lower legs and left medial dorsal foot. Significant soft tissue edema is seen with no evidence of organized fluid collection. IMPRESSION: Soft tissue edema, compatible with history of cellulitis. No evidence of organized fluid collection. Electronically Signed   By: Yetta Glassman M.D.   On: 04/25/2022 11:48   DG HIPS BILAT WITH PELVIS MIN 5 VIEWS  Result Date: 04/23/2022 CLINICAL DATA:  Bilateral hip pain. EXAM: DG HIP (WITH OR WITHOUT PELVIS) 5+V BILAT COMPARISON:  None Available. FINDINGS: Both hip joint spaces are preserved. There is bilateral acetabular spurring. Femoral heads are well seated. No fracture, erosion, or bony  destruction. Intact pubic rami. Pubic symphysis and sacroiliac joints are congruent. Prominent vascular calcifications. IMPRESSION: Mild degenerative acetabular spurring of both hips. Electronically Signed   By: Keith Rake M.D.   On: 04/23/2022 18:42   US Venous Img Lower Unilateral Left (DVT)  Result Date: 04/23/2022 CLINICAL DATA:  61 year old with left lower extremity swelling. EXAM: LEFT LOWER EXTREMITY VENOUS DOPPLER ULTRASOUND TECHNIQUE: Gray-scale sonography with graded compression, as well as color Doppler and duplex ultrasound were performed to evaluate the lower extremity deep venous systems from the level of the common femoral vein and including the common femoral, femoral, profunda femoral, popliteal and calf veins including the posterior tibial, peroneal and gastrocnemius veins when visible. The superficial great saphenous vein was also interrogated. Spectral Doppler was utilized to evaluate flow at rest and with distal augmentation maneuvers in the common femoral, femoral and popliteal veins. COMPARISON:  Venous ultrasound 11/23/2020 FINDINGS: Contralateral Common Femoral Vein: Respiratory phasicity is normal and symmetric with the symptomatic side. No evidence of thrombus. Normal compressibility. Common Femoral Vein: No evidence of thrombus. Normal compressibility, respiratory phasicity and response to augmentation. Saphenofemoral Junction: No evidence of thrombus. Normal compressibility and flow on color Doppler imaging. Profunda Femoral Vein: No evidence of thrombus. Normal compressibility and flow on color Doppler imaging. Femoral Vein: No evidence of thrombus. Normal compressibility, respiratory phasicity and response to augmentation. Popliteal Vein: No evidence of thrombus. Normal compressibility, respiratory phasicity and response to augmentation. Calf Veins: Visualized left deep calf veins are patent without thrombus. Other Findings:  None. IMPRESSION: Negative for deep venous  thrombosis in left lower extremity. Electronically Signed   By: Markus Daft M.D.   On: 04/23/2022 11:57   DG Chest Port 1  View  Result Date: 04/22/2022 CLINICAL DATA:  Sepsis. EXAM: PORTABLE CHEST 1 VIEW COMPARISON:  Chest x-ray 11/22/2020 FINDINGS: Median sternotomy wires are present. Lung volumes are low. The heart size and mediastinal contours are within normal limits. Both lungs are clear. The visualized skeletal structures are unremarkable. IMPRESSION: No active disease. Electronically Signed   By: Ronney Asters M.D.   On: 04/22/2022 17:14    Irwin Brakeman, MD  [facility]  If 7PM-7AM, please contact night-coverage [email or web address] Password [facility] 05/01/2022, 4:45 PM   LOS: 9 days

## 2022-05-02 DIAGNOSIS — L03116 Cellulitis of left lower limb: Secondary | ICD-10-CM | POA: Diagnosis not present

## 2022-05-02 DIAGNOSIS — A419 Sepsis, unspecified organism: Secondary | ICD-10-CM | POA: Diagnosis not present

## 2022-05-02 DIAGNOSIS — N179 Acute kidney failure, unspecified: Secondary | ICD-10-CM | POA: Diagnosis not present

## 2022-05-02 DIAGNOSIS — D638 Anemia in other chronic diseases classified elsewhere: Secondary | ICD-10-CM | POA: Diagnosis not present

## 2022-05-02 LAB — BASIC METABOLIC PANEL
Anion gap: 6 (ref 5–15)
BUN: 28 mg/dL — ABNORMAL HIGH (ref 6–20)
CO2: 26 mmol/L (ref 22–32)
Calcium: 8.7 mg/dL — ABNORMAL LOW (ref 8.9–10.3)
Chloride: 103 mmol/L (ref 98–111)
Creatinine, Ser: 1.08 mg/dL (ref 0.61–1.24)
GFR, Estimated: >60 mL/min (ref >60)
Glucose, Bld: 126 mg/dL — ABNORMAL HIGH (ref 70–99)
Potassium: 5.3 mmol/L — ABNORMAL HIGH (ref 3.5–5.1)
Sodium: 135 mmol/L (ref 135–145)

## 2022-05-02 LAB — GLUCOSE, CAPILLARY
Glucose-Capillary: 163 mg/dL — ABNORMAL HIGH (ref 70–99)
Glucose-Capillary: 238 mg/dL — ABNORMAL HIGH (ref 70–99)
Glucose-Capillary: 122 mg/dL — ABNORMAL HIGH (ref 70–99)
Glucose-Capillary: 158 mg/dL — ABNORMAL HIGH (ref 70–99)

## 2022-05-02 LAB — MAGNESIUM: Magnesium: 1.5 mg/dL — ABNORMAL LOW (ref 1.7–2.4)

## 2022-05-02 MED ORDER — ALBUTEROL SULFATE HFA 108 (90 BASE) MCG/ACT IN AERS: 2.0000 | INHALATION_SPRAY | Freq: Four times a day (QID) | RESPIRATORY_TRACT | Status: DC | PRN

## 2022-05-02 MED ORDER — TORSEMIDE 20 MG PO TABS: 60.0000 mg | ORAL_TABLET | Freq: Every day | ORAL | 6 refills | Status: DC

## 2022-05-02 MED ORDER — SODIUM ZIRCONIUM CYCLOSILICATE 10 G PO PACK
10.0000 g | PACK | Freq: Once | ORAL | Status: AC
  Administered 2022-05-02: 10 g via ORAL
  Filled 2022-05-02: qty 1

## 2022-05-02 MED ORDER — MAGNESIUM SULFATE 4 GM/100ML IV SOLN
4.0000 g | Freq: Once | INTRAVENOUS | Status: AC
  Administered 2022-05-02: 4 g via INTRAVENOUS
  Filled 2022-05-02: qty 100

## 2022-05-02 MED ORDER — SACCHAROMYCES BOULARDII 250 MG PO CAPS: 250.0000 mg | ORAL_CAPSULE | Freq: Two times a day (BID) | ORAL | 0 refills | Status: AC

## 2022-05-02 MED ORDER — SILVER SULFADIAZINE 1 % EX CREA: TOPICAL_CREAM | Freq: Every day | CUTANEOUS | 0 refills | Status: DC

## 2022-05-02 MED ORDER — GABAPENTIN 300 MG PO CAPS: 600.0000 mg | ORAL_CAPSULE | Freq: Two times a day (BID) | ORAL | Status: DC

## 2022-05-02 MED ORDER — POTASSIUM CHLORIDE CRYS ER 20 MEQ PO TBCR: 20.0000 meq | EXTENDED_RELEASE_TABLET | Freq: Every day | ORAL | 1 refills | Status: DC

## 2022-05-02 MED ORDER — INSULIN ASPART PROT & ASPART (70-30 MIX) 100 UNIT/ML ~~LOC~~ SUSP
8.0000 [IU] | Freq: Two times a day (BID) | SUBCUTANEOUS | 11 refills | Status: DC
Start: 2022-05-02 — End: 2022-10-17

## 2022-05-02 MED ORDER — DOXYCYCLINE HYCLATE 100 MG PO CAPS: 100.0000 mg | ORAL_CAPSULE | Freq: Two times a day (BID) | ORAL | 0 refills | Status: AC

## 2022-05-02 MED ORDER — OXYCODONE HCL 5 MG PO TABS: 5.0000 mg | ORAL_TABLET | Freq: Four times a day (QID) | ORAL | 0 refills | Status: AC | PRN

## 2022-05-02 NOTE — Progress Notes (Signed)
Pt OOB and up in the recliner with legs elevated. Call light within reach.

## 2022-05-04 LAB — CULTURE, BLOOD (ROUTINE X 2)
Culture: NO GROWTH
Culture: NO GROWTH
Special Requests: ADEQUATE
Special Requests: ADEQUATE

## 2022-05-26 NOTE — ED Provider Notes (Signed)
ED sepsis - IV fluid bolus ordered per ideal body weight   Wyvonnia Dusky, MD 05/26/22 1212

## 2022-05-27 ENCOUNTER — Encounter: Payer: Self-pay | Admitting: Internal Medicine

## 2022-06-10 ENCOUNTER — Inpatient Hospital Stay: Payer: Medicare Other | Attending: Hematology

## 2022-06-10 ENCOUNTER — Ambulatory Visit (HOSPITAL_COMMUNITY)
Admission: RE | Admit: 2022-06-10 | Discharge: 2022-06-10 | Disposition: A | Discharge status: Home / Self Care | Payer: Medicare Other | Source: Ambulatory Visit | Attending: Hematology | Admitting: Hematology

## 2022-06-10 DIAGNOSIS — Z85048 Personal history of other malignant neoplasm of rectum, rectosigmoid junction, and anus: Secondary | ICD-10-CM | POA: Insufficient documentation

## 2022-06-10 DIAGNOSIS — C20 Malignant neoplasm of rectum: Secondary | ICD-10-CM

## 2022-06-10 DIAGNOSIS — D509 Iron deficiency anemia, unspecified: Secondary | ICD-10-CM | POA: Insufficient documentation

## 2022-06-10 LAB — COMPREHENSIVE METABOLIC PANEL
ALT: 14 U/L (ref 0–44)
AST: 19 U/L (ref 15–41)
Albumin: 3.2 g/dL — ABNORMAL LOW (ref 3.5–5.0)
Alkaline Phosphatase: 145 U/L — ABNORMAL HIGH (ref 38–126)
Anion gap: 11 (ref 5–15)
BUN: 41 mg/dL — ABNORMAL HIGH (ref 6–20)
CO2: 26 mmol/L (ref 22–32)
Calcium: 9.3 mg/dL (ref 8.9–10.3)
Chloride: 97 mmol/L — ABNORMAL LOW (ref 98–111)
Creatinine, Ser: 1.44 mg/dL — ABNORMAL HIGH (ref 0.61–1.24)
GFR, Estimated: 56 mL/min — ABNORMAL LOW (ref >60)
Glucose, Bld: 101 mg/dL — ABNORMAL HIGH (ref 70–99)
Potassium: 4.4 mmol/L (ref 3.5–5.1)
Sodium: 134 mmol/L — ABNORMAL LOW (ref 135–145)
Total Bilirubin: 0.6 mg/dL (ref 0.3–1.2)
Total Protein: 8.8 g/dL — ABNORMAL HIGH (ref 6.5–8.1)

## 2022-06-10 LAB — CBC WITH DIFFERENTIAL/PLATELET
Abs Immature Granulocytes: 0.12 10*3/uL — ABNORMAL HIGH (ref 0.00–0.07)
Basophils Absolute: 0.1 10*3/uL (ref 0.0–0.1)
Basophils Relative: 1 %
Eosinophils Absolute: 0.5 10*3/uL (ref 0.0–0.5)
Eosinophils Relative: 5 %
HCT: 32.1 % — ABNORMAL LOW (ref 39.0–52.0)
Hemoglobin: 10.1 g/dL — ABNORMAL LOW (ref 13.0–17.0)
Immature Granulocytes: 1 %
Lymphocytes Relative: 21 %
Lymphs Abs: 2.2 10*3/uL (ref 0.7–4.0)
MCH: 28.7 pg (ref 26.0–34.0)
MCHC: 31.5 g/dL (ref 30.0–36.0)
MCV: 91.2 fL (ref 80.0–100.0)
Monocytes Absolute: 0.8 10*3/uL (ref 0.1–1.0)
Monocytes Relative: 8 %
Neutro Abs: 6.7 10*3/uL (ref 1.7–7.7)
Neutrophils Relative %: 64 %
Platelets: 415 10*3/uL — ABNORMAL HIGH (ref 150–400)
RBC: 3.52 MIL/uL — ABNORMAL LOW (ref 4.22–5.81)
RDW: 13.5 % (ref 11.5–15.5)
WBC: 10.4 10*3/uL (ref 4.0–10.5)
nRBC: 0 % (ref 0.0–0.2)

## 2022-06-10 LAB — IRON AND TIBC
Iron: 44 ug/dL — ABNORMAL LOW (ref 45–182)
Saturation Ratios: 15 % — ABNORMAL LOW (ref 17.9–39.5)
TIBC: 299 ug/dL (ref 250–450)
UIBC: 255 ug/dL

## 2022-06-10 LAB — FERRITIN: Ferritin: 426 ng/mL — ABNORMAL HIGH (ref 24–336)

## 2022-06-10 MED ORDER — IOHEXOL 300 MG/ML  SOLN
100.0000 mL | Freq: Once | INTRAMUSCULAR | Status: AC | PRN
  Administered 2022-06-10: 100 mL via INTRAVENOUS

## 2022-06-11 LAB — CEA: CEA: 2.1 ng/mL (ref 0.0–4.7)

## 2022-06-16 ENCOUNTER — Inpatient Hospital Stay (HOSPITAL_BASED_OUTPATIENT_CLINIC_OR_DEPARTMENT_OTHER): Payer: Medicare Other | Admitting: Hematology

## 2022-06-16 VITALS — BP 129/73 | HR 80 | Temp 97.7°F | Resp 18 | Ht 69.5 in | Wt 243.8 lb

## 2022-06-16 DIAGNOSIS — C20 Malignant neoplasm of rectum: Secondary | ICD-10-CM

## 2022-06-16 DIAGNOSIS — D509 Iron deficiency anemia, unspecified: Secondary | ICD-10-CM

## 2022-06-16 DIAGNOSIS — Z85048 Personal history of other malignant neoplasm of rectum, rectosigmoid junction, and anus: Secondary | ICD-10-CM | POA: Diagnosis present

## 2022-06-16 DIAGNOSIS — D649 Anemia, unspecified: Secondary | ICD-10-CM | POA: Diagnosis not present

## 2022-06-16 NOTE — Progress Notes (Signed)
[name], [place] [address]   CLINIC:  Medical Oncology/Hematology  PCP:  Shane Renshaw, MD [address] / [place] [address] [phone]   REASON FOR VISIT:  Follow-up for rectal adenocarcinoma  PRIOR THERAPY: none  CURRENT THERAPY: surveillance  BRIEF ONCOLOGIC HISTORY:  Oncology History   No history exists.    CANCER STAGING:  Cancer Staging  Rectal cancer [facility]) Staging form: Colon and Rectum, AJCC 8th Edition - Clinical stage from 09/03/2021: Stage 0 (cTis, cN0, cM0) - Unsigned   INTERVAL HISTORY:  [name], a 61 y.o. male, seen for follow-up of rectal cancer.  He denies any bleeding per rectum or melena.  No change in bowel habits.  Denies any new onset pains.  REVIEW OF SYSTEMS:  Review of Systems  Constitutional:  Negative for appetite change and fatigue.  Respiratory:  Positive for shortness of breath (with exertion).   Gastrointestinal:  Positive for diarrhea and nausea.  Genitourinary:  Negative for hematuria.   Neurological:  Positive for dizziness.  Psychiatric/Behavioral:  Positive for depression. The patient is nervous/anxious.   All other systems reviewed and are negative.   PAST MEDICAL/SURGICAL HISTORY:  Past Medical History:  Diagnosis Date   Anemia    Arthritis    CAD (coronary artery disease)    a. s/p CABG in 03/2020 with LIMA-LAD, RIMA-PL, RA-[id number]-OM1   Cancer ([place])    rectal   Cellulitis    COPD (chronic obstructive pulmonary disease) ([place])    Depression    Diabetes mellitus without complication (HCC)    GERD (gastroesophageal reflux disease)    History of kidney stones    Hyperlipidemia 12/01/2019   Hypertension    Hypothyroidism    Ischemic cardiomyopathy    a. EF 20-25% by echo in 02/2020 b. at 40% by echo in 03/2020 c. EF normalized to 60-65% by echo in 04/2020   Myocardial infarction [facility])    Peripheral vascular disease ([place])    Sleep apnea    Type 2 diabetes mellitus  ([place])    Past Surgical History:  Procedure Laterality Date   BIOPSY  07/18/2021   Procedure: BIOPSY;  Surgeon: Shane Harman, DO;  [facility];  Service: Endoscopy;;   BIOPSY  01/09/2022   Procedure: BIOPSY;  Surgeon: Shane Harman, DO;  [facility];  Service: Endoscopy;;   COLONOSCOPY WITH PROPOFOL N/A 07/18/2021   [place]: 15 millimeter polyp removed from the sigmoid colon, 5 mm polyp removed from sigmoid colon.  Nonbleeding internal hemorrhoids.  Significant looping of the colon. sigmoid path showed invasive colonic adenocarcinoma involving tubular adenoma (invades to depth of 44m, carcinoma 172mfrom margin, no lymphovascular invasion, no poorly differentiated component.   CORONARY ARTERY BYPASS GRAFT N/A 03/13/2020   Procedure: CORONARY ARTERY BYPASS GRAFTING (CABG) times five using bilateral Internal mammary arteries and left radial artery;  Surgeon: Shane OldsMD;  [facility] Service: Open [facility] Surgery;  Laterality: N/A;   DENTAL SURGERY     ESOPHAGOGASTRODUODENOSCOPY (EGD) WITH PROPOFOL N/A 07/18/2021   [place]ver: 1 gastric polyp status post biopsy, gastritis. gastric bx with slight chronic inflammation and no H.pyori. GEJ polypectomy with mild inflammation only   FLEXIBLE SIGMOIDOSCOPY N/A 08/26/2021   [place]: Nonbleeding internal hemorrhoids.  15 mm ulcers from previous polypectomy found in the rectum.  No evidence of previous polyp.  Located 5 to 8 cm from anal verge.   FLEXIBLE SIGMOIDOSCOPY N/A 01/09/2022  Procedure: FLEXIBLE SIGMOIDOSCOPY;  Surgeon: Shane Harman, DO;  [facility];  Service: Endoscopy;  Laterality: N/A;   POLYPECTOMY  07/18/2021   Procedure: POLYPECTOMY INTESTINAL;  Surgeon: Shane Harman, DO;  [facility];  Service: Endoscopy;;   RADIAL ARTERY HARVEST Left 03/13/2020   Procedure: [place],;  Surgeon: Shane Olds, MD;  Location: [facility];  Service: Open Heart Surgery;   Laterality: Left;   RIGHT/LEFT HEART CATH AND CORONARY ANGIOGRAPHY N/A 03/07/2020   Procedure: RIGHT/LEFT HEART CATH AND CORONARY ANGIOGRAPHY;  Surgeon: Shane Mocha, MD;  Location: [facility];  Service: Cardiovascular;  Laterality: N/A;   SUBMUCOSAL LIFTING INJECTION  01/09/2022   Procedure: SUBMUCOSAL LIFTING INJECTION;  Surgeon: Shane Harman, DO;  [facility];  Service: Endoscopy;;   SUBMUCOSAL TATTOO INJECTION  01/09/2022   Procedure: SUBMUCOSAL TATTOO INJECTION;  Surgeon: Shane Harman, DO;  [facility];  Service: Endoscopy;;   TEE WITHOUT CARDIOVERSION N/A 03/13/2020   Procedure: TRANSESOPHAGEAL ECHOCARDIOGRAM (TEE);  Surgeon: Shane Olds, MD;  Location: [facility];  Service: Open Heart Surgery;  Laterality: N/A;    SOCIAL HISTORY:  Social History   Socioeconomic History   Marital status: Single    Spouse name: Not on file   Number of children: Not on file   Years of education: Not on file   Highest education level: Not on file  Occupational History   Not on file  Tobacco Use   Smoking status: Former    Packs/day: 1.50    Types: Cigarettes    Quit date: 05/25/1995    Years since quitting: 27.0   Smokeless tobacco: Never  Vaping Use   Vaping Use: Never used  Substance and Sexual Activity   Alcohol use: Not Currently    Comment: rarely   Drug use: No   Sexual activity: Not on file  Other Topics Concern   Not on file  Social History Narrative   Not on file   Social Determinants of Health   Financial Resource Strain: Not on file  Food Insecurity: Not on file  Transportation Needs: Not on file  Physical Activity: Not on file  Stress: Not on file  Social Connections: Not on file  Intimate Partner Violence: Not on file    FAMILY HISTORY:  Family History  Problem Relation Age of Onset   Hypertension Mother     CURRENT MEDICATIONS:  Current Outpatient Medications  Medication Sig Dispense Refill   acetaminophen (TYLENOL)  500 MG tablet Take 1,000 mg by mouth every 8 (eight) hours as needed for fever or moderate pain.     albuterol (VENTOLIN HFA) 108 (90 Base) MCG/ACT inhaler Inhale 2 puffs into the lungs every 6 (six) hours as needed for wheezing or shortness of breath.     amitriptyline (ELAVIL) 25 MG tablet Take 25 mg by mouth at bedtime.     aspirin 81 MG EC tablet Take 1 tablet (81 mg total) by mouth daily with breakfast. 30 tablet 12   atorvastatin (LIPITOR) 80 MG tablet Take 80 mg by mouth at bedtime.     budesonide-formoterol (SYMBICORT) 160-4.5 MCG/ACT inhaler Inhale 2 puffs into the lungs in the morning and at bedtime. 1 Inhaler 6   Cholecalciferol (VITAMIN D) 50 MCG (2000 UT) tablet Take 2,000 Units by mouth daily.     gabapentin (NEURONTIN) 300 MG capsule Take 2 capsules (600 mg total) by mouth 2 (two) times daily.  insulin aspart protamine- aspart (NOVOLOG MIX 70/30) (70-30) 100 UNIT/ML injection Inject 0.08 mLs (8 Units total) into the skin 2 (two) times daily with a meal. 10 mL 11   levothyroxine (SYNTHROID) 75 MCG tablet Take 75 mcg by mouth daily before breakfast.     loperamide HCl (IMODIUM) 2 MG/15ML solution Take 4 mg by mouth every 6 (six) hours as needed for diarrhea or loose stools. 18m as needed     losartan (COZAAR) 50 MG tablet Take 50 mg by mouth daily.     magnesium oxide (MAG-OX) 400 MG tablet Take 400 mg by mouth daily.     metFORMIN (GLUCOPHAGE) 1000 MG tablet Take 1,000 mg by mouth 2 (two) times daily.     metoprolol succinate (TOPROL-XL) 25 MG 24 hr tablet Take 0.5 tablets (12.5 mg total) by mouth 2 (two) times daily. 90 tablet 1   omeprazole (PRILOSEC) 40 MG capsule Take 40 mg by mouth daily.     polyethylene glycol (MIRALAX / GLYCOLAX) 17 g packet Take 17 g by mouth daily as needed for moderate constipation.     polyvinyl alcohol (LIQUIFILM TEARS) 1.4 % ophthalmic solution Place 1 drop into both eyes daily.     potassium chloride SA (KLOR-CON M) 20 MEQ tablet Take 1 tablet (20  mEq total) by mouth daily. 90 tablet 1   silver sulfADIAZINE (SILVADENE) 1 % cream Apply topically daily. 50 g 0   tamsulosin (FLOMAX) 0.4 MG CAPS capsule Take 0.8 mg by mouth daily.     torsemide (DEMADEX) 20 MG tablet Take 3 tablets (60 mg total) by mouth daily. 90 tablet 6   venlafaxine XR (EFFEXOR-XR) 75 MG 24 hr capsule Take 3 capsules by mouth daily.     No current facility-administered medications for this visit.    ALLERGIES:  Allergies  Allergen Reactions   Ace Inhibitors Swelling and Cough    [facility]   Other Itching    [name]   Penicillins     Has patient had a PCN reaction causing immediate rash, facial/tongue/throat swelling, SOB or lightheadedness with hypotension: Unknown Has patient had a PCN reaction causing severe rash involving mucus membranes or skin necrosis: Unknown Has patient had a PCN reaction that required hospitalization: Unknown Has patient had a PCN reaction occurring within the last 10 years: Unknown If all of the above answers are "NO", then may proceed with Cephalosporin use.     PHYSICAL EXAM:  Performance status (ECOG): 2 - Symptomatic, <50% confined to bed  Vitals:   06/16/22 1444  BP: 129/73  Pulse: 80  Resp: 18  Temp: 97.7 F (36.5 C)  SpO2: 98%   Wt Readings from Last 3 Encounters:  06/16/22 243 lb 12.8 oz (110.6 kg)  05/02/22 273 lb 2.4 oz (123.9 kg)  02/19/22 277 lb (125.6 kg)   Physical Exam Vitals reviewed.  Constitutional:      Appearance: Normal appearance. He is obese.  Cardiovascular:     Rate and Rhythm: Normal rate and regular rhythm.     Pulses: Normal pulses.     Heart sounds: Normal heart sounds.  Pulmonary:     Effort: Pulmonary effort is normal.     Breath sounds: Normal breath sounds.  Neurological:     General: No focal deficit present.     Mental Status: He is alert and oriented to person, place, and time.  Psychiatric:        Mood and Affect:  Mood  normal.        Behavior: Behavior normal.      LABORATORY DATA:  I have reviewed the labs as listed.     Latest Ref Rng & Units 06/10/2022    9:25 AM 04/30/2022    6:11 AM 04/29/2022    4:53 AM  CBC  WBC 4.0 - 10.5 K/uL 10.4  19.3  19.4   Hemoglobin 13.0 - 17.0 g/dL 10.1  8.6  8.2   Hematocrit 39.0 - 52.0 % 32.1  25.9  25.7   Platelets 150 - 400 K/uL 415  470  419       Latest Ref Rng & Units 06/10/2022    9:25 AM 05/02/2022    5:11 AM 04/30/2022    6:11 AM  CMP  Glucose 70 - 99 mg/dL 101  126  124   BUN 6 - 20 mg/dL 41  28  26   Creatinine 0.61 - 1.24 mg/dL 1.44  1.08  0.98   Sodium 135 - 145 mmol/L 134  135  133   Potassium 3.5 - 5.1 mmol/L 4.4  5.3  4.4   Chloride 98 - 111 mmol/L 97  103  102   CO2 22 - 32 mmol/L 26  26  23    Calcium 8.9 - 10.3 mg/dL 9.3  8.7  8.4   Total Protein 6.5 - 8.1 g/dL 8.8     Total Bilirubin 0.3 - 1.2 mg/dL 0.6     Alkaline Phos 38 - 126 U/L 145     AST 15 - 41 U/L 19     ALT 0 - 44 U/L 14       DIAGNOSTIC IMAGING:  I have independently reviewed the scans and discussed with the patient. CT Abdomen Pelvis W Contrast  Result Date: 06/10/2022 CLINICAL DATA:  Rectal cancer surveillance.  * Tracking Code: BO * EXAM: CT ABDOMEN AND PELVIS WITH CONTRAST TECHNIQUE: Multidetector CT imaging of the abdomen and pelvis was performed using the standard protocol following bolus administration of intravenous contrast. RADIATION DOSE REDUCTION: This exam was performed according to the departmental dose-optimization program which includes automated exposure control, adjustment of the mA and/or kV according to patient size and/or use of iterative reconstruction technique. CONTRAST:  159m OMNIPAQUE IOHEXOL 300 MG/ML  SOLN COMPARISON:  CT 10/16/2021 FINDINGS: Lower chest: Lung bases are clear. Hepatobiliary: No focal hepatic lesion. No biliary duct dilatation. Common bile duct is normal. Pancreas: Pancreas is normal. No ductal dilatation. No pancreatic inflammation.  Spleen: Normal spleen Adrenals/urinary tract: Adrenal glands and kidneys are normal. The ureters and bladder normal. Stomach/Bowel: Stomach, small bowel, appendix, and cecum are normal. The colon and rectosigmoid colon are normal. Rectum normal. Moderate volume stool throughout the colon. Vascular/Lymphatic: Abdominal aorta normal caliber. No calcification. There small periaortic lymph nodes unchanged from prior which measure less than 10 mm short axis. Similar small common iliac lymph nodes are unchanged from prior Reproductive: Prostate unremarkable Other: No peritoneal disease Musculoskeletal: No aggressive osseous lesion. IMPRESSION: 1. No evidence rectal carcinoma metastasis. 2. Stable small periaortic retroperitoneal lymph nodes and common iliac lymph nodes. 3. Moderate volume stool throughout the colon. No obstructing lesion identified. No rectal abnormality identified by CT. Electronically Signed   By: SSuzy BouchardM.D.   On: 06/10/2022 16:55     ASSESSMENT:  Rectal adenocarcinoma arising in a polyp: - Colonoscopy on 07/18/2021 with 15 mm sessile polyp in the sigmoid colon removed with hot snare, 5 mm sessile polyp in the sigmoid  colon removed. - Pathology of the sigmoid polypectomy shows invasive colonic adenocarcinoma involving a tubular adenoma.  Carcinoma invades to a depth of 2 mm.  Carcinoma is 1 mm from the margin.  No lymphovascular invasion.  No poorly differentiated component. - Flexible sigmoidoscopy on 08/26/2021 with 15 mm ulcer from previous polypectomy found in the rectum.  This is in the rectum approximately 5 to 8 cm from the anal verge. - CT chest and abdomen with contrast from 09/12/2021 with no sign of metastatic disease in the chest.  Top normal size of left parotic lymph node measuring 10 to 12 mm but with fatty hila, nonspecific. - MRI of the pelvis without contrast from 09/12/2021 was suboptimal due to rectal spasm and gross patient motion which limits assessment.  Site of  biopsy not visible and not well assessed.  No gross extracolonic soft tissue.  Potential left meso rectal lymph node within 1 to 2 mm of mesorectal fascia.  - CT pelvis in December 2022 showed scattered pelvic lymph nodes and left para-aortic lymph node. - He was evaluated by Dr. Marcello Moores.  Pathologist reviewed his slides and felt like his cancer was superficial. - He underwent sigmoidoscopy by Dr. Abbey Chatters and biopsy at the previous resection site which showed benign colonic mucosa with benign lymphoid aggregate.  No adenomatous change or malignancy.  Social/family history: - He is a retired Marine scientist.  He works for [employer]. - He quit smoking in 1996. - He is currently residing at [facility] in [place] since January 2022.  He was originally admitted there for rehab after cellulitis.  He reports that he will soon be discharged to a community apartment. - Maternal great aunt had breast cancer.  No other malignancies.   PLAN:  Rectal adenocarcinoma in a polyp, MSI-stable, MMR preserved: - She denies any bleeding per rectum or melena.  No change in bowel habits. - Reviewed CTAP (06/10/2022): No evidence of rectal cancer.  Stable small periaortic retroperitoneal lymph nodes and common iliac lymph nodes. - Blood work showed CEA was normal at 2.1.  LFTs were grossly normal with stable elevated alk phos.  Creatinine is 1.4. - Recommend follow-up in 3 months with repeat CEA.  2.  Normocytic anemia: - Combination anemia from CKD and relative iron deficiency. - Hemoglobin today is 10.1.  Ferritin is 426 and percent saturation 15. - No indication for parenteral iron therapy. - We will recheck labs in 3 months.   Orders placed this encounter:  Orders Placed This Encounter  Procedures   CBC with Differential/Platelet   Comprehensive metabolic panel   CEA   Iron and TIBC   Ferritin      Derek Jack, MD [place] [phone]   I, Thana Ates, am acting as a  scribe for Dr. Derek Jack.  I, Derek Jack MD, have reviewed the above documentation for accuracy and completeness, and I agree with the above.

## 2022-06-16 NOTE — Patient Instructions (Signed)
Shane Chambers at [facility] Discharge Instructions  You were seen and examined today by Dr. Delton Coombes.  Dr. Delton Coombes discussed your most recent lab work and CT scan which revealed everything looks good except for your kidney functions are elevated.  Follow-up as scheduled in 3 months.    Thank you for choosing [place] at [facility] to provide your oncology and hematology care.  To afford each patient quality time with our provider, please arrive at least 15 minutes before your scheduled appointment time.   If you have a lab appointment with the [facility] please come in thru the Main Entrance and check in at the main information desk.  You need to re-schedule your appointment should you arrive 10 or more minutes late.  We strive to give you quality time with our providers, and arriving late affects you and other patients whose appointments are after yours.  Also, if you no show three or more times for appointments you may be dismissed from the clinic at the providers discretion.     Again, thank you for choosing [facility].  Our hope is that these requests will decrease the amount of time that you wait before being seen by our physicians.       _____________________________________________________________  Should you have questions after your visit to [facility], please contact our office at [phone] and follow the prompts.  Our office hours are 8:00 a.m. and 4:30 p.m. Monday - Friday.  Please note that voicemails left after 4:00 p.m. may not be returned until the following business day.  We are closed weekends and major holidays.  You do have access to a nurse 24-7, just call the main number to the clinic [phone] and do not press any options, hold on the line and a nurse will answer the phone.    For prescription refill requests, have your pharmacy contact our office and allow 72 hours.

## 2022-06-24 ENCOUNTER — Ambulatory Visit: Payer: Medicare Other | Admitting: Cardiology

## 2022-09-17 ENCOUNTER — Ambulatory Visit: Payer: Medicare Other | Admitting: Hematology

## 2022-09-17 ENCOUNTER — Inpatient Hospital Stay: Payer: Medicare Other | Attending: Hematology

## 2022-09-17 DIAGNOSIS — D509 Iron deficiency anemia, unspecified: Secondary | ICD-10-CM

## 2022-09-17 DIAGNOSIS — N189 Chronic kidney disease, unspecified: Secondary | ICD-10-CM | POA: Diagnosis not present

## 2022-09-17 DIAGNOSIS — Z85048 Personal history of other malignant neoplasm of rectum, rectosigmoid junction, and anus: Secondary | ICD-10-CM | POA: Diagnosis present

## 2022-09-17 DIAGNOSIS — C20 Malignant neoplasm of rectum: Secondary | ICD-10-CM

## 2022-09-17 DIAGNOSIS — D649 Anemia, unspecified: Secondary | ICD-10-CM

## 2022-09-17 LAB — CBC WITH DIFFERENTIAL/PLATELET
Abs Immature Granulocytes: 0.2 10*3/uL — ABNORMAL HIGH (ref 0.00–0.07)
Basophils Absolute: 0.1 10*3/uL (ref 0.0–0.1)
Basophils Relative: 1 %
Eosinophils Absolute: 0.7 10*3/uL — ABNORMAL HIGH (ref 0.0–0.5)
Eosinophils Relative: 7 %
HCT: 30.1 % — ABNORMAL LOW (ref 39.0–52.0)
Hemoglobin: 9.7 g/dL — ABNORMAL LOW (ref 13.0–17.0)
Immature Granulocytes: 2 %
Lymphocytes Relative: 15 %
Lymphs Abs: 1.5 10*3/uL (ref 0.7–4.0)
MCH: 29 pg (ref 26.0–34.0)
MCHC: 32.2 g/dL (ref 30.0–36.0)
MCV: 90.1 fL (ref 80.0–100.0)
Monocytes Absolute: 0.8 10*3/uL (ref 0.1–1.0)
Monocytes Relative: 8 %
Neutro Abs: 6.3 10*3/uL (ref 1.7–7.7)
Neutrophils Relative %: 67 %
Platelets: 350 10*3/uL (ref 150–400)
RBC: 3.34 MIL/uL — ABNORMAL LOW (ref 4.22–5.81)
RDW: 13.5 % (ref 11.5–15.5)
WBC: 9.5 10*3/uL (ref 4.0–10.5)
nRBC: 0 % (ref 0.0–0.2)

## 2022-09-17 LAB — COMPREHENSIVE METABOLIC PANEL
ALT: 20 U/L (ref 0–44)
AST: 25 U/L (ref 15–41)
Albumin: 3.5 g/dL (ref 3.5–5.0)
Alkaline Phosphatase: 94 U/L (ref 38–126)
Anion gap: 10 (ref 5–15)
BUN: 61 mg/dL — ABNORMAL HIGH (ref 6–20)
CO2: 26 mmol/L (ref 22–32)
Calcium: 9.2 mg/dL (ref 8.9–10.3)
Chloride: 101 mmol/L (ref 98–111)
Creatinine, Ser: 1.86 mg/dL — ABNORMAL HIGH (ref 0.61–1.24)
GFR, Estimated: 41 mL/min — ABNORMAL LOW (ref >60)
Glucose, Bld: 134 mg/dL — ABNORMAL HIGH (ref 70–99)
Potassium: 4.5 mmol/L (ref 3.5–5.1)
Sodium: 137 mmol/L (ref 135–145)
Total Bilirubin: 0.3 mg/dL (ref 0.3–1.2)
Total Protein: 8 g/dL (ref 6.5–8.1)

## 2022-09-17 LAB — IRON AND TIBC
Iron: 42 ug/dL — ABNORMAL LOW (ref 45–182)
Saturation Ratios: 14 % — ABNORMAL LOW (ref 17.9–39.5)
TIBC: 294 ug/dL (ref 250–450)
UIBC: 252 ug/dL

## 2022-09-17 LAB — FERRITIN: Ferritin: 249 ng/mL (ref 24–336)

## 2022-09-18 LAB — CEA: CEA: 1.8 ng/mL (ref 0.0–4.7)

## 2022-09-23 ENCOUNTER — Inpatient Hospital Stay (HOSPITAL_BASED_OUTPATIENT_CLINIC_OR_DEPARTMENT_OTHER): Payer: Medicare Other | Admitting: Hematology

## 2022-09-23 VITALS — BP 130/76 | HR 89 | Temp 97.9°F | Resp 20 | Ht 69.0 in | Wt 255.9 lb

## 2022-09-23 DIAGNOSIS — D649 Anemia, unspecified: Secondary | ICD-10-CM | POA: Diagnosis not present

## 2022-09-23 DIAGNOSIS — D509 Iron deficiency anemia, unspecified: Secondary | ICD-10-CM | POA: Diagnosis not present

## 2022-09-23 DIAGNOSIS — C20 Malignant neoplasm of rectum: Secondary | ICD-10-CM

## 2022-09-23 DIAGNOSIS — L03115 Cellulitis of right lower limb: Secondary | ICD-10-CM | POA: Diagnosis not present

## 2022-09-23 NOTE — Patient Instructions (Addendum)
[facility]  Discharge Instructions  You were seen and examined today by Dr. Delton Coombes.  Dr. Delton Coombes discussed your most recent lab work which revealed that everything looks good except your hemoglobin and iron has came down some.   Dr. Delton Coombes recommends that you take iron over the counter once daily. If it causes constipation you should take a stool softener. If no improvement with oral iron we will consider IV iron.  Follow-up as scheduled in 3 months with labs and scan prior.    Thank you for choosing [facility] to provide your oncology and hematology care.   To afford each patient quality time with our provider, please arrive at least 15 minutes before your scheduled appointment time. You may need to reschedule your appointment if you arrive late (10 or more minutes). Arriving late affects you and other patients whose appointments are after yours.  Also, if you miss three or more appointments without notifying the office, you may be dismissed from the clinic at the provider's discretion.    Again, thank you for choosing [facility].  Our hope is that these requests will decrease the amount of time that you wait before being seen by our physicians.   If you have a lab appointment with the [facility] please come in thru the Main Entrance and check in at the main information desk.           _____________________________________________________________  Should you have questions after your visit to [facility], please contact our office at [phone] and follow the prompts.  Our office hours are 8:00 a.m. to 4:30 p.m. Monday - Thursday and 8:00 a.m. to 2:30 p.m. Friday.  Please note that voicemails left after 4:00 p.m. may not be returned until the following business day.  We are closed weekends and all major holidays.  You do have access to a nurse 24-7, just call the main number to the clinic  [phone] and do not press any options, hold on the line and a nurse will answer the phone.    For prescription refill requests, have your pharmacy contact our office and allow 72 hours.    Masks are optional in the cancer centers. If you would like for your care team to wear a mask while they are taking care of you, please let them know. You may have one support person who is at least 59 years old accompany you for your appointments.

## 2022-09-23 NOTE — Progress Notes (Signed)
[place],  [address]   CLINIC:  Medical Oncology/Hematology  PCP:  Shane Renshaw, MD [address] / [place] [address] [phone]   REASON FOR VISIT:  Follow-up for rectal adenocarcinoma  PRIOR THERAPY: none  CURRENT THERAPY: surveillance  BRIEF ONCOLOGIC HISTORY:  Oncology History   No history exists.    CANCER STAGING:  Cancer Staging  Rectal cancer [facility]) Staging form: Colon and Rectum, AJCC 8th Edition - Clinical stage from 09/03/2021: Stage 0 (cTis, cN0, cM0) - Unsigned   INTERVAL HISTORY:  [name], a 61 y.o. male, seen for follow-up of rectal cancer.  Denies any bleeding per rectum or any melena.  Reports dyspnea on exertion which is stable.  Denies new onset pains.  REVIEW OF SYSTEMS:  Review of Systems  Constitutional:  Negative for appetite change and fatigue.  Respiratory:  Positive for shortness of breath (with exertion).   Gastrointestinal:  Positive for diarrhea and nausea.  Genitourinary:  Negative for hematuria.   Neurological:  Positive for dizziness and headaches.  Psychiatric/Behavioral:  Positive for depression and sleep disturbance. The patient is nervous/anxious.   All other systems reviewed and are negative.   PAST MEDICAL/SURGICAL HISTORY:  Past Medical History:  Diagnosis Date   Anemia    Arthritis    CAD (coronary artery disease)    a. s/p CABG in 03/2020 with LIMA-LAD, RIMA-PL, [id number]-OM1   Cancer ([place])    rectal   Cellulitis    COPD (chronic obstructive pulmonary disease) ([place])    Depression    Diabetes mellitus without complication (HCC)    GERD (gastroesophageal reflux disease)    History of kidney stones    Hyperlipidemia 12/01/2019   Hypertension    Hypothyroidism    Ischemic cardiomyopathy    a. EF 20-25% by echo in 02/2020 b. at 40% by echo in 03/2020 c. EF normalized to 60-65% by echo in 04/2020   Myocardial infarction [facility])    Peripheral vascular disease  ([place])    Sleep apnea    Type 2 diabetes mellitus ([place])    Past Surgical History:  Procedure Laterality Date   BIOPSY  07/18/2021   Procedure: BIOPSY;  Surgeon: Eloise Harman, DO;  [facility];  Service: Endoscopy;;   BIOPSY  01/09/2022   Procedure: BIOPSY;  Surgeon: Eloise Harman, DO;  [facility];  Service: Endoscopy;;   COLONOSCOPY WITH PROPOFOL N/A 07/18/2021   [place]: 15 millimeter polyp removed from the sigmoid colon, 5 mm polyp removed from sigmoid colon.  Nonbleeding internal hemorrhoids.  Significant looping of the colon. sigmoid path showed invasive colonic adenocarcinoma involving tubular adenoma (invades to depth of 55m, carcinoma 11mfrom margin, no lymphovascular invasion, no poorly differentiated component.   CORONARY ARTERY BYPASS GRAFT N/A 03/13/2020   Procedure: CORONARY ARTERY BYPASS GRAFTING (CABG) times five using bilateral Internal mammary arteries and left radial artery;  Surgeon: AtWonda OldsMD;  [facility] Service: Open Heart Surgery;  Laterality: N/A;   DENTAL SURGERY     ESOPHAGOGASTRODUODENOSCOPY (EGD) WITH PROPOFOL N/A 07/18/2021   [place]: 1 gastric polyp status post biopsy, gastritis. gastric bx with slight chronic inflammation and no H.pyori. GEJ polypectomy with mild inflammation only   FLEXIBLE SIGMOIDOSCOPY N/A 08/26/2021   [place]: Nonbleeding internal hemorrhoids.  15 mm ulcers from previous polypectomy found in the rectum.  No evidence of previous polyp.  Located 5 to 8 cm from anal verge.  FLEXIBLE SIGMOIDOSCOPY N/A 01/09/2022   Procedure: FLEXIBLE SIGMOIDOSCOPY;  Surgeon: Eloise Harman, DO;  [facility];  Service: Endoscopy;  Laterality: N/A;   POLYPECTOMY  07/18/2021   Procedure: POLYPECTOMY INTESTINAL;  Surgeon: Eloise Harman, DO;  [facility];  Service: Endoscopy;;   RADIAL ARTERY HARVEST Left 03/13/2020   Procedure: [place],;  Surgeon: [name], MD;   Location: [place];  Service: Open Heart Surgery;  Laterality: Left;   RIGHT/LEFT HEART CATH AND CORONARY ANGIOGRAPHY N/A 03/07/2020   Procedure: RIGHT/LEFT HEART CATH AND CORONARY ANGIOGRAPHY;  Surgeon: Sherren Mocha, MD;  Location: [place] [facility];  Service: Cardiovascular;  Laterality: N/A;   SUBMUCOSAL LIFTING INJECTION  01/09/2022   Procedure: SUBMUCOSAL LIFTING INJECTION;  Surgeon: Eloise Harman, DO;  [facility];  Service: Endoscopy;;   SUBMUCOSAL TATTOO INJECTION  01/09/2022   Procedure: SUBMUCOSAL TATTOO INJECTION;  Surgeon: Eloise Harman, DO;  [facility];  Service: Endoscopy;;   TEE WITHOUT CARDIOVERSION N/A 03/13/2020   Procedure: TRANSESOPHAGEAL ECHOCARDIOGRAM (TEE);  Surgeon: [name], MD;  Location: [place];  Service: Open Heart Surgery;  Laterality: N/A;    SOCIAL HISTORY:  Social History   Socioeconomic History   Marital status: Single    Spouse name: Not on file   Number of children: Not on file   Years of education: Not on file   Highest education level: Not on file  Occupational History   Not on file  Tobacco Use   Smoking status: Former    Packs/day: 1.50    Types: Cigarettes    Quit date: 05/25/1995    Years since quitting: 27.3   Smokeless tobacco: Never  Vaping Use   Vaping Use: Never used  Substance and Sexual Activity   Alcohol use: Not Currently    Comment: rarely   Drug use: No   Sexual activity: Not on file  Other Topics Concern   Not on file  Social History Narrative   Not on file   Social Determinants of Health   Financial Resource Strain: Not on file  Food Insecurity: Not on file  Transportation Needs: Not on file  Physical Activity: Not on file  Stress: Not on file  Social Connections: Not on file  Intimate Partner Violence: Not on file    FAMILY HISTORY:  Family History  Problem Relation Age of Onset   Hypertension Mother     CURRENT MEDICATIONS:  Current Outpatient Medications   Medication Sig Dispense Refill   amitriptyline (ELAVIL) 75 MG tablet Take 75 mg by mouth at bedtime.     Insulin Lispro Prot & Lispro (HUMALOG 75/25 MIX) (75-25) 100 UNIT/ML Kwikpen Inject into the skin.     acetaminophen (TYLENOL) 500 MG tablet Take 1,000 mg by mouth every 8 (eight) hours as needed for fever or moderate pain.     albuterol (VENTOLIN HFA) 108 (90 Base) MCG/ACT inhaler Inhale 2 puffs into the lungs every 6 (six) hours as needed for wheezing or shortness of breath.     aspirin 81 MG EC tablet Take 1 tablet (81 mg total) by mouth daily with breakfast. 30 tablet 12   atorvastatin (LIPITOR) 80 MG tablet Take 80 mg by mouth at bedtime.     budesonide-formoterol (SYMBICORT) 160-4.5 MCG/ACT inhaler Inhale 2 puffs into the lungs in the morning and at bedtime. 1 Inhaler 6   Cholecalciferol (VITAMIN D) 50 MCG (2000 UT) tablet Take 2,000 Units by mouth  daily.     gabapentin (NEURONTIN) 300 MG capsule Take 2 capsules (600 mg total) by mouth 2 (two) times daily.     insulin aspart protamine- aspart (NOVOLOG MIX 70/30) (70-30) 100 UNIT/ML injection Inject 0.08 mLs (8 Units total) into the skin 2 (two) times daily with a meal. 10 mL 11   levothyroxine (SYNTHROID) 75 MCG tablet Take 75 mcg by mouth daily before breakfast.     loperamide HCl (IMODIUM) 2 MG/15ML solution Take 4 mg by mouth every 6 (six) hours as needed for diarrhea or loose stools. 72m as needed     losartan (COZAAR) 50 MG tablet Take 50 mg by mouth daily.     magnesium oxide (MAG-OX) 400 MG tablet Take 400 mg by mouth daily.     metFORMIN (GLUCOPHAGE) 1000 MG tablet Take 1,000 mg by mouth 2 (two) times daily.     metoprolol succinate (TOPROL-XL) 25 MG 24 hr tablet Take 0.5 tablets (12.5 mg total) by mouth 2 (two) times daily. 90 tablet 1   omeprazole (PRILOSEC) 40 MG capsule Take 40 mg by mouth daily.     polyethylene glycol (MIRALAX / GLYCOLAX) 17 g packet Take 17 g by mouth daily as needed for moderate constipation.      polyvinyl alcohol (LIQUIFILM TEARS) 1.4 % ophthalmic solution Place 1 drop into both eyes daily.     potassium chloride SA (KLOR-CON M) 20 MEQ tablet Take 1 tablet (20 mEq total) by mouth daily. 90 tablet 1   silver sulfADIAZINE (SILVADENE) 1 % cream Apply topically daily. 50 g 0   tamsulosin (FLOMAX) 0.4 MG CAPS capsule Take 0.8 mg by mouth daily.     torsemide (DEMADEX) 20 MG tablet Take 3 tablets (60 mg total) by mouth daily. 90 tablet 6   venlafaxine XR (EFFEXOR-XR) 75 MG 24 hr capsule Take 3 capsules by mouth daily.     No current facility-administered medications for this visit.    ALLERGIES:  Allergies  Allergen Reactions   Ace Inhibitors Swelling and Cough    ([facility]   Other Itching    [name]   Penicillins     Has patient had a PCN reaction causing immediate rash, facial/tongue/throat swelling, SOB or lightheadedness with hypotension: Unknown Has patient had a PCN reaction causing severe rash involving mucus membranes or skin necrosis: Unknown Has patient had a PCN reaction that required hospitalization: Unknown Has patient had a PCN reaction occurring within the last 10 years: Unknown If all of the above answers are "NO", then may proceed with Cephalosporin use.     PHYSICAL EXAM:  Performance status (ECOG): 2 - Symptomatic, <50% confined to bed  Vitals:   09/23/22 1405  BP: 130/76  Pulse: 89  Resp: 20  Temp: 97.9 F (36.6 C)  SpO2: 98%   Wt Readings from Last 3 Encounters:  09/23/22 255 lb 14.4 oz (116.1 kg)  06/16/22 243 lb 12.8 oz (110.6 kg)  05/02/22 273 lb 2.4 oz (123.9 kg)   Physical Exam Vitals reviewed.  Constitutional:      Appearance: Normal appearance. He is obese.  Cardiovascular:     Rate and Rhythm: Normal rate and regular rhythm.     Pulses: Normal pulses.     Heart sounds: Normal heart sounds.  Pulmonary:     Effort: Pulmonary effort is normal.     Breath sounds: Normal breath sounds.   Neurological:     General: No focal deficit  present.     Mental Status: He is alert and oriented to person, place, and time.  Psychiatric:        Mood and Affect: Mood normal.        Behavior: Behavior normal.      LABORATORY DATA:  I have reviewed the labs as listed.     Latest Ref Rng & Units 09/17/2022    1:47 PM 06/10/2022    9:25 AM 04/30/2022    6:11 AM  CBC  WBC 4.0 - 10.5 K/uL 9.5  10.4  19.3   Hemoglobin 13.0 - 17.0 g/dL 9.7  10.1  8.6   Hematocrit 39.0 - 52.0 % 30.1  32.1  25.9   Platelets 150 - 400 K/uL 350  415  470       Latest Ref Rng & Units 09/17/2022    1:47 PM 06/10/2022    9:25 AM 05/02/2022    5:11 AM  CMP  Glucose 70 - 99 mg/dL 134  101  126   BUN 6 - 20 mg/dL 61  41  28   Creatinine 0.61 - 1.24 mg/dL 1.86  1.44  1.08   Sodium 135 - 145 mmol/L 137  134  135   Potassium 3.5 - 5.1 mmol/L 4.5  4.4  5.3   Chloride 98 - 111 mmol/L 101  97  103   CO2 22 - 32 mmol/L _0 Calcium 8.9 - 10.3 mg/dL 9.2  9.3  8.7   Total Protein 6.5 - 8.1 g/dL 8.0  8.8    Total Bilirubin 0.3 - 1.2 mg/dL 0.3  0.6    Alkaline Phos 38 - 126 U/L 94  145    AST 15 - 41 U/L 25  19    ALT 0 - 44 U/L 20  14      DIAGNOSTIC IMAGING:  I have independently reviewed the scans and discussed with the patient. No results found.   ASSESSMENT:  Rectal adenocarcinoma arising in a polyp: - Colonoscopy on 07/18/2021 with 15 mm sessile polyp in the sigmoid colon removed with hot snare, 5 mm sessile polyp in the sigmoid colon removed. - Pathology of the sigmoid polypectomy shows invasive colonic adenocarcinoma involving a tubular adenoma.  Carcinoma invades to a depth of 2 mm.  Carcinoma is 1 mm from the margin.  No lymphovascular invasion.  No poorly differentiated component. - Flexible sigmoidoscopy on 08/26/2021 with 15 mm ulcer from previous polypectomy found in the rectum.  This is in the rectum approximately 5 to 8 cm from the anal verge. - CT chest and abdomen with contrast from  09/12/2021 with no sign of metastatic disease in the chest.  Top normal size of left parotic lymph node measuring 10 to 12 mm but with fatty hila, nonspecific. - MRI of the pelvis without contrast from 09/12/2021 was suboptimal due to rectal spasm and gross patient motion which limits assessment.  Site of biopsy not visible and not well assessed.  No gross extracolonic soft tissue.  Potential left meso rectal lymph node within 1 to 2 mm of mesorectal fascia.  - CT pelvis in December 2022 showed scattered pelvic lymph nodes and left para-aortic lymph node. - He was evaluated by Dr. Marcello Moores.  Pathologist reviewed his slides and felt like his cancer was superficial. - He underwent sigmoidoscopy by Dr. Abbey Chatters and biopsy at the previous resection site which showed benign colonic mucosa with benign lymphoid aggregate.  No adenomatous change or malignancy.  Social/family history: - He is a retired Marine scientist.  He works for [employer]. - He quit smoking in 1996. - He is currently residing at [facility] in [place] since January 2022.  He was originally admitted there for rehab after cellulitis.  He reports that he will soon be discharged to a community apartment. - Maternal great aunt had breast cancer.  No other malignancies.   PLAN:  Rectal adenocarcinoma in a polyp, MSI-stable, MMR preserved: - CTAP (06/10/2022): No evidence of rectal cancer.  Stable small periaortic retroperitoneal lymph nodes and common iliac lymph nodes. - Reviewed labs from 09/17/2022 which showed normal LFTs.  CEA was normal at 1.8. - Recommend follow-up in 3 months with repeat CTAP with contrast and CEA level along with LFTs.   2.  Normocytic anemia: - Combination anemia from CKD and relative iron deficiency. - Hemoglobin is 9.7.  Ferritin is 249 and percent saturation 14.  Creatinine is 1.8. - Recommend starting iron tablet daily with stool softener. - We will repeat ferritin and iron panel at next visit in 3 months.  If  no improvement, will consider parenteral iron therapy.   Orders placed this encounter:  No orders of the defined types were placed in this encounter.     Derek Jack, MD [place] [phone]

## 2022-09-25 ENCOUNTER — Other Ambulatory Visit: Payer: Self-pay

## 2022-09-25 ENCOUNTER — Encounter (HOSPITAL_COMMUNITY): Payer: Self-pay | Admitting: Emergency Medicine

## 2022-09-25 ENCOUNTER — Emergency Department (HOSPITAL_COMMUNITY): Payer: Medicare Other

## 2022-09-25 ENCOUNTER — Inpatient Hospital Stay (HOSPITAL_COMMUNITY)
Admission: EM | Admit: 2022-09-25 | Discharge: 2022-10-01 | DRG: 602 | Disposition: A | Discharge status: Skilled Nursing Facility | Payer: Medicare Other | Source: Skilled Nursing Facility | Attending: Internal Medicine | Admitting: Internal Medicine

## 2022-09-25 DIAGNOSIS — M009 Pyogenic arthritis, unspecified: Secondary | ICD-10-CM | POA: Diagnosis not present

## 2022-09-25 DIAGNOSIS — Z794 Long term (current) use of insulin: Secondary | ICD-10-CM | POA: Diagnosis not present

## 2022-09-25 DIAGNOSIS — Z951 Presence of aortocoronary bypass graft: Secondary | ICD-10-CM | POA: Diagnosis not present

## 2022-09-25 DIAGNOSIS — E785 Hyperlipidemia, unspecified: Secondary | ICD-10-CM | POA: Diagnosis present

## 2022-09-25 DIAGNOSIS — Z87891 Personal history of nicotine dependence: Secondary | ICD-10-CM | POA: Diagnosis not present

## 2022-09-25 DIAGNOSIS — J449 Chronic obstructive pulmonary disease, unspecified: Secondary | ICD-10-CM | POA: Diagnosis not present

## 2022-09-25 DIAGNOSIS — Z880 Allergy status to penicillin: Secondary | ICD-10-CM

## 2022-09-25 DIAGNOSIS — I2510 Atherosclerotic heart disease of native coronary artery without angina pectoris: Secondary | ICD-10-CM | POA: Diagnosis present

## 2022-09-25 DIAGNOSIS — Z79899 Other long term (current) drug therapy: Secondary | ICD-10-CM

## 2022-09-25 DIAGNOSIS — R6520 Severe sepsis without septic shock: Secondary | ICD-10-CM | POA: Diagnosis not present

## 2022-09-25 DIAGNOSIS — M869 Osteomyelitis, unspecified: Secondary | ICD-10-CM | POA: Diagnosis not present

## 2022-09-25 DIAGNOSIS — I110 Hypertensive heart disease with heart failure: Secondary | ICD-10-CM | POA: Diagnosis present

## 2022-09-25 DIAGNOSIS — Z8249 Family history of ischemic heart disease and other diseases of the circulatory system: Secondary | ICD-10-CM | POA: Diagnosis not present

## 2022-09-25 DIAGNOSIS — I5042 Chronic combined systolic (congestive) and diastolic (congestive) heart failure: Secondary | ICD-10-CM | POA: Diagnosis present

## 2022-09-25 DIAGNOSIS — D638 Anemia in other chronic diseases classified elsewhere: Secondary | ICD-10-CM | POA: Diagnosis present

## 2022-09-25 DIAGNOSIS — Z85048 Personal history of other malignant neoplasm of rectum, rectosigmoid junction, and anus: Secondary | ICD-10-CM

## 2022-09-25 DIAGNOSIS — D72829 Elevated white blood cell count, unspecified: Secondary | ICD-10-CM | POA: Diagnosis not present

## 2022-09-25 DIAGNOSIS — E1169 Type 2 diabetes mellitus with other specified complication: Secondary | ICD-10-CM | POA: Diagnosis not present

## 2022-09-25 DIAGNOSIS — L03115 Cellulitis of right lower limb: Principal | ICD-10-CM | POA: Diagnosis present

## 2022-09-25 DIAGNOSIS — D649 Anemia, unspecified: Secondary | ICD-10-CM | POA: Diagnosis not present

## 2022-09-25 DIAGNOSIS — F32A Depression, unspecified: Secondary | ICD-10-CM | POA: Diagnosis present

## 2022-09-25 DIAGNOSIS — I878 Other specified disorders of veins: Secondary | ICD-10-CM | POA: Diagnosis present

## 2022-09-25 DIAGNOSIS — Z7951 Long term (current) use of inhaled steroids: Secondary | ICD-10-CM

## 2022-09-25 DIAGNOSIS — Z888 Allergy status to other drugs, medicaments and biological substances status: Secondary | ICD-10-CM

## 2022-09-25 DIAGNOSIS — K5900 Constipation, unspecified: Secondary | ICD-10-CM | POA: Diagnosis present

## 2022-09-25 DIAGNOSIS — Z6838 Body mass index (BMI) 38.0-38.9, adult: Secondary | ICD-10-CM

## 2022-09-25 DIAGNOSIS — A419 Sepsis, unspecified organism: Secondary | ICD-10-CM | POA: Diagnosis not present

## 2022-09-25 DIAGNOSIS — E669 Obesity, unspecified: Secondary | ICD-10-CM | POA: Diagnosis present

## 2022-09-25 DIAGNOSIS — E870 Hyperosmolality and hypernatremia: Secondary | ICD-10-CM | POA: Diagnosis not present

## 2022-09-25 DIAGNOSIS — K219 Gastro-esophageal reflux disease without esophagitis: Secondary | ICD-10-CM | POA: Diagnosis present

## 2022-09-25 DIAGNOSIS — E1159 Type 2 diabetes mellitus with other circulatory complications: Secondary | ICD-10-CM | POA: Diagnosis not present

## 2022-09-25 DIAGNOSIS — G4733 Obstructive sleep apnea (adult) (pediatric): Secondary | ICD-10-CM | POA: Diagnosis present

## 2022-09-25 DIAGNOSIS — E1151 Type 2 diabetes mellitus with diabetic peripheral angiopathy without gangrene: Secondary | ICD-10-CM | POA: Diagnosis present

## 2022-09-25 DIAGNOSIS — E039 Hypothyroidism, unspecified: Secondary | ICD-10-CM | POA: Diagnosis present

## 2022-09-25 DIAGNOSIS — Z7984 Long term (current) use of oral hypoglycemic drugs: Secondary | ICD-10-CM

## 2022-09-25 DIAGNOSIS — N179 Acute kidney failure, unspecified: Secondary | ICD-10-CM | POA: Diagnosis present

## 2022-09-25 DIAGNOSIS — I10 Essential (primary) hypertension: Secondary | ICD-10-CM | POA: Diagnosis present

## 2022-09-25 DIAGNOSIS — R309 Painful micturition, unspecified: Secondary | ICD-10-CM | POA: Diagnosis present

## 2022-09-25 DIAGNOSIS — F331 Major depressive disorder, recurrent, moderate: Secondary | ICD-10-CM | POA: Diagnosis not present

## 2022-09-25 DIAGNOSIS — E119 Type 2 diabetes mellitus without complications: Secondary | ICD-10-CM

## 2022-09-25 DIAGNOSIS — Z79890 Hormone replacement therapy (postmenopausal): Secondary | ICD-10-CM

## 2022-09-25 DIAGNOSIS — R262 Difficulty in walking, not elsewhere classified: Secondary | ICD-10-CM | POA: Diagnosis present

## 2022-09-25 DIAGNOSIS — Z1152 Encounter for screening for COVID-19: Secondary | ICD-10-CM | POA: Diagnosis not present

## 2022-09-25 DIAGNOSIS — I252 Old myocardial infarction: Secondary | ICD-10-CM | POA: Diagnosis not present

## 2022-09-25 DIAGNOSIS — R532 Functional quadriplegia: Secondary | ICD-10-CM | POA: Diagnosis present

## 2022-09-25 DIAGNOSIS — I4721 Torsades de pointes: Secondary | ICD-10-CM | POA: Diagnosis not present

## 2022-09-25 DIAGNOSIS — E11649 Type 2 diabetes mellitus with hypoglycemia without coma: Secondary | ICD-10-CM

## 2022-09-25 DIAGNOSIS — R7881 Bacteremia: Secondary | ICD-10-CM | POA: Diagnosis not present

## 2022-09-25 DIAGNOSIS — Z7982 Long term (current) use of aspirin: Secondary | ICD-10-CM

## 2022-09-25 DIAGNOSIS — M86271 Subacute osteomyelitis, right ankle and foot: Secondary | ICD-10-CM | POA: Diagnosis not present

## 2022-09-25 LAB — COMPREHENSIVE METABOLIC PANEL
ALT: 34 U/L (ref 0–44)
AST: 49 U/L — ABNORMAL HIGH (ref 15–41)
Albumin: 3.3 g/dL — ABNORMAL LOW (ref 3.5–5.0)
Alkaline Phosphatase: 90 U/L (ref 38–126)
Anion gap: 11 (ref 5–15)
BUN: 75 mg/dL — ABNORMAL HIGH (ref 6–20)
CO2: 25 mmol/L (ref 22–32)
Calcium: 9.4 mg/dL (ref 8.9–10.3)
Chloride: 99 mmol/L (ref 98–111)
Creatinine, Ser: 2.19 mg/dL — ABNORMAL HIGH (ref 0.61–1.24)
GFR, Estimated: 34 mL/min — ABNORMAL LOW (ref >60)
Glucose, Bld: 99 mg/dL (ref 70–99)
Potassium: 4.7 mmol/L (ref 3.5–5.1)
Sodium: 135 mmol/L (ref 135–145)
Total Bilirubin: 0.4 mg/dL (ref 0.3–1.2)
Total Protein: 8 g/dL (ref 6.5–8.1)

## 2022-09-25 LAB — CBC WITH DIFFERENTIAL/PLATELET
Abs Immature Granulocytes: 0.54 10*3/uL — ABNORMAL HIGH (ref 0.00–0.07)
Basophils Absolute: 0 10*3/uL (ref 0.0–0.1)
Basophils Relative: 0 %
Eosinophils Absolute: 0.2 10*3/uL (ref 0.0–0.5)
Eosinophils Relative: 1 %
HCT: 29.1 % — ABNORMAL LOW (ref 39.0–52.0)
Hemoglobin: 9.2 g/dL — ABNORMAL LOW (ref 13.0–17.0)
Immature Granulocytes: 3 %
Lymphocytes Relative: 4 %
Lymphs Abs: 0.8 10*3/uL (ref 0.7–4.0)
MCH: 28.3 pg (ref 26.0–34.0)
MCHC: 31.6 g/dL (ref 30.0–36.0)
MCV: 89.5 fL (ref 80.0–100.0)
Monocytes Absolute: 1.1 10*3/uL — ABNORMAL HIGH (ref 0.1–1.0)
Monocytes Relative: 5 %
Neutro Abs: 18.8 10*3/uL — ABNORMAL HIGH (ref 1.7–7.7)
Neutrophils Relative %: 87 %
Platelets: 282 10*3/uL (ref 150–400)
RBC: 3.25 MIL/uL — ABNORMAL LOW (ref 4.22–5.81)
RDW: 14.1 % (ref 11.5–15.5)
WBC: 21.4 10*3/uL — ABNORMAL HIGH (ref 4.0–10.5)
nRBC: 0 % (ref 0.0–0.2)

## 2022-09-25 LAB — PROTIME-INR
INR: 1.5 — ABNORMAL HIGH (ref 0.8–1.2)
Prothrombin Time: 17.9 seconds — ABNORMAL HIGH (ref 11.4–15.2)

## 2022-09-25 LAB — LACTIC ACID, PLASMA
Lactic Acid, Venous: 2.5 mmol/L (ref 0.5–1.9)
Lactic Acid, Venous: 2 mmol/L (ref 0.5–1.9)

## 2022-09-25 LAB — RESP PANEL BY RT-PCR (FLU A&B, COVID) ARPGX2
Influenza A by PCR: NEGATIVE
Influenza B by PCR: NEGATIVE
SARS Coronavirus 2 by RT PCR: NEGATIVE

## 2022-09-25 MED ORDER — INSULIN ASPART 100 UNIT/ML IJ SOLN
0.0000 [IU] | Freq: Every day | INTRAMUSCULAR | Status: DC
  Administered 2022-09-30: 3 [IU] via SUBCUTANEOUS

## 2022-09-25 MED ORDER — VANCOMYCIN HCL 2000 MG/400ML IV SOLN
2000.0000 mg | Freq: Once | INTRAVENOUS | Status: AC
  Administered 2022-09-25: 2000 mg via INTRAVENOUS
  Filled 2022-09-25: qty 400

## 2022-09-25 MED ORDER — VANCOMYCIN HCL IN DEXTROSE 1-5 GM/200ML-% IV SOLN
1000.0000 mg | INTRAVENOUS | Status: DC
  Administered 2022-09-26 – 2022-09-27 (×2): 1000 mg via INTRAVENOUS
  Filled 2022-09-25 (×2): qty 200

## 2022-09-25 MED ORDER — ALBUTEROL SULFATE (2.5 MG/3ML) 0.083% IN NEBU
3.0000 mL | INHALATION_SOLUTION | Freq: Four times a day (QID) | RESPIRATORY_TRACT | Status: DC | PRN
  Administered 2022-09-27 – 2022-09-29 (×7): 3 mL via RESPIRATORY_TRACT
  Filled 2022-09-25 (×8): qty 3

## 2022-09-25 MED ORDER — LEVOTHYROXINE SODIUM 75 MCG PO TABS
75.0000 ug | ORAL_TABLET | Freq: Every day | ORAL | Status: DC
  Administered 2022-09-26 – 2022-10-01 (×6): 75 ug via ORAL
  Filled 2022-09-25 (×6): qty 1

## 2022-09-25 MED ORDER — ATORVASTATIN CALCIUM 40 MG PO TABS
80.0000 mg | ORAL_TABLET | Freq: Every day | ORAL | Status: DC
  Administered 2022-09-26 – 2022-09-30 (×6): 80 mg via ORAL
  Filled 2022-09-25 (×6): qty 2

## 2022-09-25 MED ORDER — ACETAMINOPHEN 325 MG PO TABS
650.0000 mg | ORAL_TABLET | Freq: Four times a day (QID) | ORAL | Status: DC | PRN
  Administered 2022-09-26 – 2022-09-30 (×3): 650 mg via ORAL
  Filled 2022-09-25 (×3): qty 2

## 2022-09-25 MED ORDER — MORPHINE SULFATE (PF) 4 MG/ML IV SOLN
4.0000 mg | INTRAVENOUS | Status: DC | PRN
  Administered 2022-09-25: 4 mg via INTRAVENOUS
  Filled 2022-09-25: qty 1

## 2022-09-25 MED ORDER — MORPHINE SULFATE (PF) 4 MG/ML IV SOLN
4.0000 mg | Freq: Once | INTRAVENOUS | Status: AC
  Administered 2022-09-25: 4 mg via INTRAVENOUS
  Filled 2022-09-25: qty 1

## 2022-09-25 MED ORDER — ASPIRIN 81 MG PO TBEC
81.0000 mg | DELAYED_RELEASE_TABLET | Freq: Every day | ORAL | Status: DC
  Administered 2022-09-26 – 2022-10-01 (×6): 81 mg via ORAL
  Filled 2022-09-25 (×6): qty 1

## 2022-09-25 MED ORDER — MOMETASONE FURO-FORMOTEROL FUM 200-5 MCG/ACT IN AERO
2.0000 | INHALATION_SPRAY | Freq: Two times a day (BID) | RESPIRATORY_TRACT | Status: DC
  Administered 2022-09-26 – 2022-10-01 (×11): 2 via RESPIRATORY_TRACT
  Filled 2022-09-25: qty 8.8

## 2022-09-25 MED ORDER — LACTATED RINGERS IV SOLN
INTRAVENOUS | Status: DC
Start: 2022-09-25 — End: 2022-09-25

## 2022-09-25 MED ORDER — POLYETHYLENE GLYCOL 3350 17 G PO PACK
17.0000 g | PACK | Freq: Every day | ORAL | Status: DC | PRN
  Administered 2022-09-26 – 2022-09-27 (×2): 17 g via ORAL
  Filled 2022-09-25 (×2): qty 1

## 2022-09-25 MED ORDER — SODIUM CHLORIDE 0.9 % IV SOLN
2.0000 g | Freq: Once | INTRAVENOUS | Status: AC
  Administered 2022-09-25: 2 g via INTRAVENOUS
  Filled 2022-09-25: qty 20

## 2022-09-25 MED ORDER — HEPARIN SODIUM (PORCINE) 5000 UNIT/ML IJ SOLN
5000.0000 [IU] | Freq: Three times a day (TID) | INTRAMUSCULAR | Status: DC
  Administered 2022-09-26 – 2022-09-29 (×11): 5000 [IU] via SUBCUTANEOUS
  Filled 2022-09-25 (×11): qty 1

## 2022-09-25 MED ORDER — ACETAMINOPHEN 500 MG PO TABS
1000.0000 mg | ORAL_TABLET | Freq: Once | ORAL | Status: AC
Start: 2022-09-25 — End: 2022-09-25
  Administered 2022-09-25: 1000 mg via ORAL
  Filled 2022-09-25: qty 2

## 2022-09-25 MED ORDER — LACTATED RINGERS IV SOLN: INTRAVENOUS | Status: AC

## 2022-09-25 MED ORDER — ONDANSETRON HCL 4 MG PO TABS: 4.0000 mg | ORAL_TABLET | Freq: Four times a day (QID) | ORAL | Status: DC | PRN

## 2022-09-25 MED ORDER — ONDANSETRON HCL 4 MG/2ML IJ SOLN: 4.0000 mg | Freq: Four times a day (QID) | INTRAMUSCULAR | Status: DC | PRN

## 2022-09-25 MED ORDER — SODIUM CHLORIDE 0.9 % IV SOLN
2.0000 g | Freq: Once | INTRAVENOUS | Status: AC
  Administered 2022-09-26: 2 g via INTRAVENOUS
  Filled 2022-09-25: qty 20

## 2022-09-25 MED ORDER — INSULIN ASPART 100 UNIT/ML IJ SOLN
0.0000 [IU] | Freq: Three times a day (TID) | INTRAMUSCULAR | Status: DC
  Administered 2022-09-26 – 2022-09-27 (×3): 1 [IU] via SUBCUTANEOUS
  Administered 2022-09-28: 2 [IU] via SUBCUTANEOUS
  Administered 2022-09-28: 1 [IU] via SUBCUTANEOUS
  Administered 2022-09-28: 3 [IU] via SUBCUTANEOUS
  Administered 2022-09-29 – 2022-09-30 (×3): 2 [IU] via SUBCUTANEOUS
  Administered 2022-09-30 – 2022-10-01 (×2): 1 [IU] via SUBCUTANEOUS

## 2022-09-25 MED ORDER — ACETAMINOPHEN 650 MG RE SUPP: 650.0000 mg | Freq: Four times a day (QID) | RECTAL | Status: DC | PRN

## 2022-09-25 NOTE — Assessment & Plan Note (Signed)
-   SSI- S -Hold home insulins - HgbA1c

## 2022-09-25 NOTE — Assessment & Plan Note (Signed)
Cr- 2.19.  4 months ago his baseline creatinine was about 1.  But gradually it has up trended to 2.1 today. - Hydrate -Hold diuretics, losartan

## 2022-09-25 NOTE — Progress Notes (Signed)
Pharmacy Antibiotic Note  Shane Chambers is a 61 y.o. male admitted on 09/25/2022 with cellulitis.  Pharmacy has been consulted for Vancomycin dosing.  Plan: Vancomycin 2000 mg IV loading dose, then 1gm IV Q 24 hrs. Goal AUC 400-550. Expected AUC: 433 SCr used: 1.86 F/U cxs and clinical progress Monitor V/S, labs and levels as indicated     Temp (24hrs), Avg:100.3 F (37.9 C), Min:100.3 F (37.9 C), Max:100.3 F (37.9 C)  No results for input(s): "WBC", "CREATININE", "LATICACIDVEN", "VANCOTROUGH", "VANCOPEAK", "VANCORANDOM", "GENTTROUGH", "GENTPEAK", "GENTRANDOM", "TOBRATROUGH", "TOBRAPEAK", "TOBRARND", "AMIKACINPEAK", "AMIKACINTROU", "AMIKACIN" in the last 168 hours.  Estimated Creatinine Clearance: 53.8 mL/min (A) (by C-G formula based on SCr of 1.86 mg/dL (H)).    Allergies  Allergen Reactions   Ace Inhibitors Swelling and Cough    (Not on MAR at [facility] [place])   Other Itching    [name]   Penicillins     Has patient had a PCN reaction causing immediate rash, facial/tongue/throat swelling, SOB or lightheadedness with hypotension: Unknown Has patient had a PCN reaction causing severe rash involving mucus membranes or skin necrosis: Unknown Has patient had a PCN reaction that required hospitalization: Unknown Has patient had a PCN reaction occurring within the last 10 years: Unknown If all of the above answers are "NO", then may proceed with Cephalosporin use.     Antimicrobials this admission: Vancomycin 11/16 >>  Ceftriaxone 11/16x 1 dose in ED  Microbiology results: 11/16 BCx: pending  Thank you for allowing pharmacy to be a part of this patient's care.  Isac Sarna, BS Pharm D, BCPS Clinical Pharmacist 09/25/2022 5:24 PM

## 2022-09-25 NOTE — Assessment & Plan Note (Signed)
CPAP QHS

## 2022-09-25 NOTE — ED Triage Notes (Signed)
Pt from [place] and rehab with reports of elevated WBC count. Pt has redness and swelling to the left and right lower extremities.

## 2022-09-25 NOTE — Progress Notes (Signed)
Patient has declined use of a CPAP at this time; RT spoke with nurse and we will continue ti monitor the patient through the evening.

## 2022-09-25 NOTE — Assessment & Plan Note (Signed)
-   Soft to stable -Hold losartan also in the setting of AKI -Hold metoprolol, and torsemide for now.

## 2022-09-25 NOTE — Progress Notes (Signed)
Elink following code sepsis °

## 2022-09-25 NOTE — Progress Notes (Signed)
Patient arrived to room 337 from ED.  Assessment complete, VS obtained, and Admission database began.

## 2022-09-25 NOTE — Assessment & Plan Note (Signed)
Appears stable and compensated.  Weight per chart appears stable.  Currently septic.  Blood pressure intermittently soft.  Last echo 2021, EF of 55 to 60% with grade 2 DD. -Hold torsemide 60 mg daily for now -Gentle hydration in the setting of severe sepsis

## 2022-09-25 NOTE — Assessment & Plan Note (Signed)
Reports chronic dyspnea on exertion over the past several years, unchanged.  EKG unchanged. -Resume statins, aspirin

## 2022-09-25 NOTE — Assessment & Plan Note (Signed)
Cellulitis of right lower extremity

## 2022-09-25 NOTE — ED Provider Notes (Signed)
[facility] Provider Note   CSN: [id number] Arrival date & time: 09/25/22  1640     History  Chief Complaint  Patient presents with   Abnormal Lab    [facility] is a 61 y.o. male.  Pt is a 61 yo male with pmhx significant for DM, HTN, depression, HLD, CAD, ischemic CM, sleep apnea, pvd, rectal cancer, copd, hypothyroidism, and gerd.  Pt is followed by Dr. Delton Coombes for his cancer.  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  Pt lives at [facility] and rehab.  They did some labs today which were abnormal.  He was sent here for eval.  Pt has had cellulitis and sepsis in the past.  He is worried he is septic again.  Pt did notice redness to his right leg a few days ago.        Home Medications Prior to Admission medications   Medication Sig Start Date End Date Taking? Authorizing Provider  acetaminophen (TYLENOL) 500 MG tablet Take 1,000 mg by mouth every 8 (eight) hours as needed for fever or moderate pain.    [provider]  albuterol (VENTOLIN HFA) 108 (90 Base) MCG/ACT inhaler Inhale 2 puffs into the lungs every 6 (six) hours as needed for wheezing or shortness of breath. 05/02/22   Johnson, Clanford L, MD  amitriptyline (ELAVIL) 75 MG tablet Take 75 mg by mouth at bedtime. 08/29/22   [provider]  aspirin 81 MG EC tablet Take 1 tablet (81 mg total) by mouth daily with breakfast. 11/29/20   Roxan Hockey, MD  atorvastatin (LIPITOR) 80 MG tablet Take 80 mg by mouth at bedtime.    [provider]  budesonide-formoterol (SYMBICORT) 160-4.5 MCG/ACT inhaler Inhale 2 puffs into the lungs in the morning and at bedtime. 05/24/20   Rigoberto Noel, MD  Cholecalciferol (VITAMIN D) 50 MCG (2000 UT) tablet Take 2,000 Units by mouth daily.    [provider]  gabapentin (NEURONTIN) 300 MG capsule Take 2 capsules (600 mg total) by mouth 2 (two) times daily. 05/02/22   Johnson, Clanford L, MD  insulin  aspart protamine- aspart (NOVOLOG MIX 70/30) (70-30) 100 UNIT/ML injection Inject 0.08 mLs (8 Units total) into the skin 2 (two) times daily with a meal. 05/02/22   Johnson, Clanford L, MD  Insulin Lispro Prot & Lispro (HUMALOG 75/25 MIX) (75-25) 100 UNIT/ML Kwikpen Inject into the skin. 09/05/22   [provider]  levothyroxine (SYNTHROID) 75 MCG tablet Take 75 mcg by mouth daily before breakfast.    [provider]  loperamide HCl (IMODIUM) 2 MG/15ML solution Take 4 mg by mouth every 6 (six) hours as needed for diarrhea or loose stools. 89m as needed    [provider]  losartan (COZAAR) 50 MG tablet Take 50 mg by mouth daily.    [provider]  magnesium oxide (MAG-OX) 400 MG tablet Take 400 mg by mouth daily.    [provider]  metFORMIN (GLUCOPHAGE) 1000 MG tablet Take 1,000 mg by mouth 2 (two) times daily.    [provider]  metoprolol succinate (TOPROL-XL) 25 MG 24 hr tablet Take 0.5 tablets (12.5 mg total) by mouth 2 (two) times daily. 08/21/20   QVerta Ellen, NP  omeprazole (PRILOSEC) 40 MG capsule Take 40 mg by mouth daily. 01/01/22   [provider]  polyethylene glycol (MIRALAX / GLYCOLAX) 17 g packet Take 17 g by mouth daily as  needed for moderate constipation.    [provider]  polyvinyl alcohol (LIQUIFILM TEARS) 1.4 % ophthalmic solution Place 1 drop into both eyes daily.    [provider]  potassium chloride SA (KLOR-CON M) 20 MEQ tablet Take 1 tablet (20 mEq total) by mouth daily. 05/05/22   Johnson, Clanford L, MD  silver sulfADIAZINE (SILVADENE) 1 % cream Apply topically daily. 05/03/22   Johnson, Clanford L, MD  tamsulosin (FLOMAX) 0.4 MG CAPS capsule Take 0.8 mg by mouth daily.    [provider]  torsemide (DEMADEX) 20 MG tablet Take 3 tablets (60 mg total) by mouth daily. 05/03/22   Johnson, Clanford L, MD  venlafaxine XR (EFFEXOR-XR) 75 MG 24 hr capsule Take 3 capsules by mouth  daily. 01/21/22   [provider]      Allergies    Ace inhibitors, Other, and Penicillins    Review of Systems   Review of Systems  Skin:  Positive for rash.  All other systems reviewed and are negative.   Physical Exam Updated Vital Signs BP (!) 142/67   Pulse 97   Temp 100.3 F (37.9 C)   Resp 18   Ht '5\' 9"'$  (1.753 m)   Wt 115.7 kg   SpO2 95%   BMI 37.66 kg/m  Physical Exam Vitals and nursing note reviewed.  Constitutional:      Appearance: Normal appearance.  HENT:     Head: Normocephalic and atraumatic.     Right Ear: External ear normal.     Left Ear: External ear normal.     Nose: Nose normal.     Mouth/Throat:     Mouth: Mucous membranes are dry.  Eyes:     Extraocular Movements: Extraocular movements intact.     Conjunctiva/sclera: Conjunctivae normal.     Pupils: Pupils are equal, round, and reactive to light.  Cardiovascular:     Rate and Rhythm: Normal rate and regular rhythm.     Pulses: Normal pulses.     Heart sounds: Normal heart sounds.  Pulmonary:     Effort: Pulmonary effort is normal.     Breath sounds: Normal breath sounds.  Abdominal:     General: Abdomen is flat. Bowel sounds are normal.     Palpations: Abdomen is soft.  Musculoskeletal:        General: Normal range of motion.     Cervical back: Normal range of motion and neck supple.     Right lower leg: Edema present.     Left lower leg: Edema present.  Skin:    Capillary Refill: Capillary refill takes less than 2 seconds.     Comments: Cellulitis RLE with lymphangitis up to groin.  See image.  Neurological:     General: No focal deficit present.     Mental Status: He is alert and oriented to person, place, and time.  Psychiatric:        Mood and Affect: Mood is anxious.     ED Results / Procedures / Treatments   Labs (all labs ordered are listed, but only abnormal results are displayed) Labs Reviewed  LACTIC ACID, PLASMA - Abnormal; Notable for the following  components:      Result Value   Lactic Acid, Venous 2.5 (*)    All other components within normal limits  COMPREHENSIVE METABOLIC PANEL - Abnormal; Notable for the following components:   BUN 75 (*)    Creatinine, Ser 2.19 (*)    Albumin 3.3 (*)  AST 49 (*)    GFR, Estimated 34 (*)    All other components within normal limits  CBC WITH DIFFERENTIAL/PLATELET - Abnormal; Notable for the following components:   WBC 21.4 (*)    RBC 3.25 (*)    Hemoglobin 9.2 (*)    HCT 29.1 (*)    Neutro Abs 18.8 (*)    Monocytes Absolute 1.1 (*)    Abs Immature Granulocytes 0.54 (*)    All other components within normal limits  PROTIME-INR - Abnormal; Notable for the following components:   Prothrombin Time 17.9 (*)    INR 1.5 (*)    All other components within normal limits  CULTURE, BLOOD (ROUTINE X 2)  RESP PANEL BY RT-PCR (FLU A&B, COVID) [id number]  CULTURE, BLOOD (ROUTINE X 2)  LACTIC ACID, PLASMA  URINALYSIS, ROUTINE W REFLEX MICROSCOPIC    EKG EKG Interpretation  Date/Time:  Thursday September 25 2022 17:28:10 EST Ventricular Rate:  94 PR Interval:  166 QRS Duration: 116 QT Interval:  370 QTC Calculation: 463 R Axis:   67 Text Interpretation: Sinus rhythm Nonspecific intraventricular conduction delay Abnormal inferior Q waves No significant change since last tracing Confirmed by Isla Pence [phone]) on 09/25/2022 6:21:36 PM  Radiology DG Chest Port 1 View  Result Date: 09/25/2022 CLINICAL DATA:  Sepsis, elevated white blood cell count, lower extremity erythema and swelling EXAM: PORTABLE CHEST 1 VIEW COMPARISON:  04/22/2022 FINDINGS: Single frontal view of the chest was obtained in lordotic positioning. Cardiac silhouette is unremarkable. Postsurgical changes from median sternotomy. No airspace disease, effusion, or pneumothorax. No acute bony abnormalities. IMPRESSION: 1. No acute intrathoracic process. Electronically Signed   By: Randa Ngo M.D.   On: 09/25/2022 17:26     Procedures Procedures    Medications Ordered in ED Medications  lactated ringers infusion ( Intravenous New Bag/Given 09/25/22 1819)  vancomycin (VANCOREADY) IVPB 2000 mg/400 mL (2,000 mg Intravenous New Bag/Given 09/25/22 1820)    Followed by  vancomycin (VANCOCIN) IVPB 1000 mg/200 mL premix (has no administration in time range)  cefTRIAXone (ROCEPHIN) 2 g in sodium chloride 0.9 % 100 mL IVPB (0 g Intravenous Stopped 09/25/22 1830)  acetaminophen (TYLENOL) tablet 1,000 mg (1,000 mg Oral Given 09/25/22 1744)  morphine (PF) 4 MG/ML injection 4 mg (4 mg Intravenous Given 09/25/22 1744)    ED Course/ Medical Decision Making/ A&P                           Medical Decision Making Amount and/or Complexity of Data Reviewed Labs: ordered. Radiology: ordered. ECG/medicine tests: ordered.  Risk OTC drugs. Prescription drug management. Decision regarding hospitalization.   This patient presents to the ED for concern of sepsis, this involves an extensive number of treatment options, and is a complaint that carries with it a high risk of complications and morbidity.  The differential diagnosis includes cellulitis, other infection   Co morbidities that complicate the patient evaluation  DM, HTN, depression, HLD, CAD, ischemic CM, sleep apnea, pvd, rectal cancer, copd, hypothyroidism, and gerd   Additional history obtained:  Additional history obtained from epic chart review External records from outside source obtained and reviewed including EMS report   Lab Tests:  I Ordered, and personally interpreted labs.  The pertinent results include:  wbc elevated at 21.4; hgb 9.2 (anemia is chronic), cmp with bun elevated at 75 and cr elevated at 2.19 (61 and 1.86 on 11/8)   Imaging Studies ordered:  I ordered  imaging studies including cxr  I independently visualized and interpreted imaging which showed nothing acute I agree with the radiologist interpretation   Cardiac  Monitoring:  The patient was maintained on a cardiac monitor.  I personally viewed and interpreted the cardiac monitored which showed an underlying rhythm of: nsr   Medicines ordered and prescription drug management:  I ordered medication including rocephin/vancomycin  for cellulitis  Reevaluation of the patient after these medicines showed that the patient improved I have reviewed the patients home medicines and have made adjustments as needed   Critical Interventions:  abx   Consultations Obtained:  I requested consultation with the hospitalist (Dr. Denton Brick),  and discussed lab and imaging findings as well as pertinent plan - she will admit.   Problem List / ED Course:  Cellulitis:  pt started on rocephin/vanc.  Code sepsis called due to elevated wbc and lactic acid.  He is not in septic shock, so did not get the sepsis fluids.  Pt d/w Dr. Denton Brick for admission.   Reevaluation:  After the interventions noted above, I reevaluated the patient and found that they have :improved   Social Determinants of Health:  Lives in rehab   Dispostion:  After consideration of the diagnostic results and the patients response to treatment, I feel that the patent would benefit from admission.    CRITICAL CARE Performed by: Isla Pence   Total critical care time: 30 minutes  Critical care time was exclusive of separately billable procedures and treating other patients.  Critical care was necessary to treat or prevent imminent or life-threatening deterioration.  Critical care was time spent personally by me on the following activities: development of treatment plan with patient and/or surrogate as well as nursing, discussions with consultants, evaluation of patient's response to treatment, examination of patient, obtaining history from patient or surrogate, ordering and performing treatments and interventions, ordering and review of laboratory studies, ordering and review of  radiographic studies, pulse oximetry and re-evaluation of patient's condition.         Final Clinical Impression(s) / ED Diagnoses Final diagnoses:  Cellulitis of right lower extremity    Rx / DC Orders ED Discharge Orders     None         Isla Pence, MD 09/25/22 1905

## 2022-09-25 NOTE — H&P (Signed)
History and Physical    [name] TDV:[id number] DOB: 09/12/1961 DOA: 09/25/2022  PCP: Caprice Renshaw, MD   Patient coming from: [facility]  I have personally briefly reviewed patient's old medical records in [place]  Chief Complaint: Leg pain and swelling  HPI: Shane Chambers is a 61 y.o. male with medical history significant for mellitus, systolic and diastolic CHF, CABG x5, OSA, hypertension, COPD, rectal cancer. Presented to the ED with complaints of pain and redness to bilateral legs but significantly worse on the right.  Was sent to the ED from nursing home reports of elevated white count.  Patient reports that symptoms began again 2 to 3 weeks after he was discharged from the hospital- ~ 5 months ago.  He denies having any pets causing any wounds to his lower extremities. He also reports some pain with urination ongoing for weeks to months.  Was hospitalized in June from 6/13-6/23 for bilateral lower extremity cellulitis for which she was treated with IV Vanco and ceftriaxone and then switched to Zyvox, discharged on doxycycline.  He had MRI of his left lower extremity findings negative for osteomyelitis.  ED Course: Tmax 100.3.  Heart rate 84-97.  Respirate rate 18-20.  Blood pressure systolic 06-269.  Leukocytosis of 21.4.  Lactic acidosis of 2.5. Port Chest x-ray negative for acute abnormality. Ceftriaxone and vancomycin started.  Hospitalist admit for cellulitis.  Review of Systems: As per HPI all other systems reviewed and negative.  Past Medical History:  Diagnosis Date   Anemia    Arthritis    CAD (coronary artery disease)    a. s/p CABG in 03/2020 with LIMA-LAD, RIMA-PL, RA-[id number]-OM1   Cancer ([place])    rectal   Cellulitis    COPD (chronic obstructive pulmonary disease) ([place])    Depression    Diabetes mellitus without complication (HCC)    GERD (gastroesophageal reflux disease)    History of kidney stones    Hyperlipidemia 12/01/2019    Hypertension    Hypothyroidism    Ischemic cardiomyopathy    a. EF 20-25% by echo in 02/2020 b. at 40% by echo in 03/2020 c. EF normalized to 60-65% by echo in 04/2020   Myocardial infarction [facility])    Peripheral vascular disease ([place])    Sleep apnea    Type 2 diabetes mellitus ([place])     Past Surgical History:  Procedure Laterality Date   BIOPSY  07/18/2021   Procedure: BIOPSY;  Surgeon: Eloise Harman, DO;  [facility];  Service: Endoscopy;;   BIOPSY  01/09/2022   Procedure: BIOPSY;  Surgeon: Eloise Harman, DO;  [facility];  Service: Endoscopy;;   COLONOSCOPY WITH PROPOFOL N/A 07/18/2021   Carver: 15 millimeter polyp removed from the sigmoid colon, 5 mm polyp removed from sigmoid colon.  Nonbleeding internal hemorrhoids.  Significant looping of the colon. sigmoid path showed invasive colonic adenocarcinoma involving tubular adenoma (invades to depth of 77m, carcinoma 152mfrom margin, no lymphovascular invasion, no poorly differentiated component.   CORONARY ARTERY BYPASS GRAFT N/A 03/13/2020   Procedure: CORONARY ARTERY BYPASS GRAFTING (CABG) times five using bilateral Internal mammary arteries and left radial artery;  Surgeon: AtWonda OldsMD;  Location: [facility] Service: Open Heart Surgery;  Laterality: N/A;   DENTAL SURGERY     ESOPHAGOGASTRODUODENOSCOPY (EGD) WITH PROPOFOL N/A 07/18/2021   Carver: 1 gastric polyp status post biopsy, gastritis. gastric bx with slight chronic inflammation and no H.pyori. GEJ polypectomy  with mild inflammation only   FLEXIBLE SIGMOIDOSCOPY N/A 08/26/2021   Carver: Nonbleeding internal hemorrhoids.  15 mm ulcers from previous polypectomy found in the rectum.  No evidence of previous polyp.  Located 5 to 8 cm from anal verge.   FLEXIBLE SIGMOIDOSCOPY N/A 01/09/2022   Procedure: FLEXIBLE SIGMOIDOSCOPY;  Surgeon: Eloise Harman, DO;  [facility];  Service: Endoscopy;  Laterality: N/A;   POLYPECTOMY   07/18/2021   Procedure: POLYPECTOMY INTESTINAL;  Surgeon: Eloise Harman, DO;  [facility];  Service: Endoscopy;;   RADIAL ARTERY HARVEST Left 03/13/2020   Procedure: [place],;  Surgeon: [name], MD;  Location: [facility];  Service: Open Heart Surgery;  Laterality: Left;   RIGHT/LEFT HEART CATH AND CORONARY ANGIOGRAPHY N/A 03/07/2020   Procedure: RIGHT/LEFT HEART CATH AND CORONARY ANGIOGRAPHY;  Surgeon: Sherren Mocha, MD;  Location: [facility];  Service: Cardiovascular;  Laterality: N/A;   SUBMUCOSAL LIFTING INJECTION  01/09/2022   Procedure: SUBMUCOSAL LIFTING INJECTION;  Surgeon: Eloise Harman, DO;  [facility];  Service: Endoscopy;;   SUBMUCOSAL TATTOO INJECTION  01/09/2022   Procedure: SUBMUCOSAL TATTOO INJECTION;  Surgeon: Eloise Harman, DO;  [facility];  Service: Endoscopy;;   TEE WITHOUT CARDIOVERSION N/A 03/13/2020   Procedure: TRANSESOPHAGEAL ECHOCARDIOGRAM (TEE);  Surgeon: [name], MD;  Location: [facility];  Service: Open Heart Surgery;  Laterality: N/A;     reports that he quit smoking about 27 years ago. His smoking use included cigarettes. He smoked an average of 1.5 packs per day. He has never used smokeless tobacco. He reports that he does not currently use alcohol. He reports that he does not use drugs.  Allergies  Allergen Reactions   Ace Inhibitors Swelling and Cough    (Not on MAR at [facility])   Other Itching    [name]   Penicillins     Has patient had a PCN reaction causing immediate rash, facial/tongue/throat swelling, SOB or lightheadedness with hypotension: Unknown Has patient had a PCN reaction causing severe rash involving mucus membranes or skin necrosis: Unknown Has patient had a PCN reaction that required hospitalization: Unknown Has patient had a PCN reaction occurring within the last 10 years: Unknown If all of the above answers are "NO", then  may proceed with Cephalosporin use.     Family History  Problem Relation Age of Onset   Hypertension Mother     Prior to Admission medications   Medication Sig Start Date End Date Taking? Authorizing Provider  acetaminophen (TYLENOL) 500 MG tablet Take 1,000 mg by mouth every 8 (eight) hours as needed for fever or moderate pain.    [provider]  albuterol (VENTOLIN HFA) 108 (90 Base) MCG/ACT inhaler Inhale 2 puffs into the lungs every 6 (six) hours as needed for wheezing or shortness of breath. 05/02/22   Johnson, Clanford L, MD  amitriptyline (ELAVIL) 75 MG tablet Take 75 mg by mouth at bedtime. 08/29/22   [provider]  aspirin 81 MG EC tablet Take 1 tablet (81 mg total) by mouth daily with breakfast. 11/29/20   Roxan Hockey, MD  atorvastatin (LIPITOR) 80 MG tablet Take 80 mg by mouth at bedtime.    [provider]  budesonide-formoterol (SYMBICORT) 160-4.5 MCG/ACT inhaler Inhale 2 puffs into the lungs in the morning and at bedtime. 05/24/20   Rigoberto Noel, MD  Cholecalciferol (VITAMIN D) 50 MCG (2000 UT)  tablet Take 2,000 Units by mouth daily.    [provider]  gabapentin (NEURONTIN) 300 MG capsule Take 2 capsules (600 mg total) by mouth 2 (two) times daily. 05/02/22   Johnson, Clanford L, MD  insulin aspart protamine- aspart (NOVOLOG MIX 70/30) (70-30) 100 UNIT/ML injection Inject 0.08 mLs (8 Units total) into the skin 2 (two) times daily with a meal. 05/02/22   Johnson, Clanford L, MD  Insulin Lispro Prot & Lispro (HUMALOG 75/25 MIX) (75-25) 100 UNIT/ML Kwikpen Inject into the skin. 09/05/22   [provider]  levothyroxine (SYNTHROID) 75 MCG tablet Take 75 mcg by mouth daily before breakfast.    [provider]  loperamide HCl (IMODIUM) 2 MG/15ML solution Take 4 mg by mouth every 6 (six) hours as needed for diarrhea or loose stools. 12m as needed    [provider]  losartan (COZAAR) 50 MG tablet Take 50 mg by mouth  daily.    [provider]  magnesium oxide (MAG-OX) 400 MG tablet Take 400 mg by mouth daily.    [provider]  metFORMIN (GLUCOPHAGE) 1000 MG tablet Take 1,000 mg by mouth 2 (two) times daily.    [provider]  metoprolol succinate (TOPROL-XL) 25 MG 24 hr tablet Take 0.5 tablets (12.5 mg total) by mouth 2 (two) times daily. 08/21/20   QVerta Ellen, NP  omeprazole (PRILOSEC) 40 MG capsule Take 40 mg by mouth daily. 01/01/22   [provider]  polyethylene glycol (MIRALAX / GLYCOLAX) 17 g packet Take 17 g by mouth daily as needed for moderate constipation.    [provider]  polyvinyl alcohol (LIQUIFILM TEARS) 1.4 % ophthalmic solution Place 1 drop into both eyes daily.    [provider]  potassium chloride SA (KLOR-CON M) 20 MEQ tablet Take 1 tablet (20 mEq total) by mouth daily. 05/05/22   Johnson, Clanford L, MD  silver sulfADIAZINE (SILVADENE) 1 % cream Apply topically daily. 05/03/22   Johnson, Clanford L, MD  tamsulosin (FLOMAX) 0.4 MG CAPS capsule Take 0.8 mg by mouth daily.    [provider]  torsemide (DEMADEX) 20 MG tablet Take 3 tablets (60 mg total) by mouth daily. 05/03/22   Johnson, Clanford L, MD  venlafaxine XR (EFFEXOR-XR) 75 MG 24 hr capsule Take 3 capsules by mouth daily. 01/21/22   [provider]    Physical Exam: Vitals:   09/25/22 1655 09/25/22 1755  BP: (!) 142/67   Pulse: 97   Resp: 18   Temp: 100.3 F (37.9 C)   SpO2: 95%   Weight:  115.7 kg  Height:  '5\' 9"'$  (1.753 m)    Constitutional: NAD, calm, comfortable Vitals:   09/25/22 1655 09/25/22 1755  BP: (!) 142/67   Pulse: 97   Resp: 18   Temp: 100.3 F (37.9 C)   SpO2: 95%   Weight:  115.7 kg  Height:  '5\' 9"'$  (1.753 m)   Eyes: PERRL, lids and conjunctivae normal ENMT: Mucous membranes are very dry.  Neck: normal, supple, no masses, no thyromegaly Respiratory: clear to auscultation bilaterally, no wheezing, no crackles.  Normal respiratory effort. No accessory muscle use.  Cardiovascular: Regular rate and rhythm, 3/6 known systolic murmur / rubs / gallops.  At least trace Bilateral pitting lower extremity edema worse on the right-unable to fully appreciate due to tenderness Abdomen: full, no tenderness, no masses palpated. No hepatosplenomegaly. Bowel sounds positive.  Musculoskeletal: no clubbing / cyanosis. No joint deformity upper and lower  extremities. Good ROM, no contractures. Normal muscle tone.  Skin: Significant erythema to right lower extremity, compared to left, with small scab wound to mid shin. Neurologic: No apparent cranial nerve abnormality, moving extremities spontaneously Psychiatric: Normal judgment and insight. Alert and oriented x 3. Normal mood.     Labs on Admission: I have personally reviewed following labs and imaging studies  CBC: Recent Labs  Lab 09/25/22 1720  WBC 21.4*  NEUTROABS 18.8*  HGB 9.2*  HCT 29.1*  MCV 89.5  PLT 732   Basic Metabolic Panel: Recent Labs  Lab 09/25/22 1720  NA 135  K 4.7  CL 99  CO2 25  GLUCOSE 99  BUN 75*  CREATININE 2.19*  CALCIUM 9.4   GFR: Estimated Creatinine Clearance: 45.6 mL/min (A) (by C-G formula based on SCr of 2.19 mg/dL (H)). Liver Function Tests: Recent Labs  Lab 09/25/22 1720  AST 49*  ALT 34  ALKPHOS 90  BILITOT 0.4  PROT 8.0  ALBUMIN 3.3*    Coagulation Profile: Recent Labs  Lab 09/25/22 1801  INR 1.5*    Radiological Exams on Admission: DG Chest Port 1 View  Result Date: 09/25/2022 CLINICAL DATA:  Sepsis, elevated white blood cell count, lower extremity erythema and swelling EXAM: PORTABLE CHEST 1 VIEW COMPARISON:  04/22/2022 FINDINGS: Single frontal view of the chest was obtained in lordotic positioning. Cardiac silhouette is unremarkable. Postsurgical changes from median sternotomy. No airspace disease, effusion, or pneumothorax. No acute bony abnormalities. IMPRESSION: 1. No acute intrathoracic  process. Electronically Signed   By: Randa Ngo M.D.   On: 09/25/2022 17:26    EKG: Independently reviewed.  Sinus tachycardia rate 94, QTc 463.  No significant ST or T wave abnormalities.  Assessment/Plan Principal Problem:   Cellulitis of right lower extremity Active Problems:   Severe sepsis (HCC)   Hypertension   Type 2 diabetes mellitus (HCC)   S/P CABG x 5   OSA (obstructive sleep apnea)   Systolic and diastolic CHF, chronic (HCC)   AKI (acute kidney injury) ([place])  Assessment and Plan: * Cellulitis of right lower extremity Cellulitis of right lower extremity with severe sepsis.  Leukocytosis of 21.4, Tmax 100.3, heart rate 84-97, evidence of endorgan dysfunction, lactic acidosis 2.5 > 2 and AKI.  Recurrent cellulitis -Continue IV vancomycin and ceftriaxone -Follow-up blood cultures -Obtain UA -Right lower extremity venous ultrasound -Continuous fluids LR initially started at 150 cc/h, will reduce to 75 cc/h  AKI (acute kidney injury) ([place]) Cr- 2.19.  4 months ago his baseline creatinine was about 1.  But gradually it has up trended to 2.1 today. - Hydrate -Hold diuretics, losartan  Systolic and diastolic CHF, chronic (HCC) Appears stable and compensated.  Weight per chart appears stable.  Currently septic.  Blood pressure intermittently soft.  Last echo 2021, EF of 55 to 60% with grade 2 DD. -Hold torsemide 60 mg daily for now -Gentle hydration in the setting of severe sepsis  OSA (obstructive sleep apnea) CPAP QHS  S/P CABG x 5 Reports chronic dyspnea on exertion over the past several years, unchanged.  EKG unchanged. -Resume statins, aspirin  Type 2 diabetes mellitus ([place]) - SSI- S -Hold home insulins - HgbA1c  Hypertension - Soft to stable -Hold losartan also in the setting of AKI -Hold metoprolol, and torsemide for now.   DVT prophylaxis: Heparin Code Status: Full Family Communication: None at bedside Disposition Plan: > 2 days Consults called:  None  Admission status: Inpt Tele  I certify that at  the point of admission it is my clinical judgment that the patient will require inpatient hospital care spanning beyond 2 midnights from the point of admission due to high intensity of service, high risk for further deterioration and high frequency of surveillance required.    Author: Bethena Roys, MD 09/25/2022 9:55 PM  For on call review [email or web address].

## 2022-09-25 NOTE — Assessment & Plan Note (Addendum)
Cellulitis of right lower extremity with severe sepsis.  Leukocytosis of 21.4, Tmax 100.3, heart rate 84-97, evidence of endorgan dysfunction, lactic acidosis 2.5 > 2 and AKI.  Recurrent cellulitis -Continue IV vancomycin and ceftriaxone -Follow-up blood cultures -Obtain UA -Right lower extremity venous ultrasound -Continuous fluids LR initially started at 150 cc/h, will reduce to 75 cc/h

## 2022-09-26 ENCOUNTER — Inpatient Hospital Stay (HOSPITAL_COMMUNITY): Payer: Medicare Other

## 2022-09-26 ENCOUNTER — Ambulatory Visit: Payer: Medicare Other | Admitting: Student

## 2022-09-26 DIAGNOSIS — L03115 Cellulitis of right lower limb: Secondary | ICD-10-CM | POA: Diagnosis not present

## 2022-09-26 LAB — BASIC METABOLIC PANEL
Anion gap: 10 (ref 5–15)
BUN: 75 mg/dL — ABNORMAL HIGH (ref 6–20)
CO2: 24 mmol/L (ref 22–32)
Calcium: 8.8 mg/dL — ABNORMAL LOW (ref 8.9–10.3)
Chloride: 103 mmol/L (ref 98–111)
Creatinine, Ser: 2.17 mg/dL — ABNORMAL HIGH (ref 0.61–1.24)
GFR, Estimated: 34 mL/min — ABNORMAL LOW (ref >60)
Glucose, Bld: 103 mg/dL — ABNORMAL HIGH (ref 70–99)
Potassium: 4.7 mmol/L (ref 3.5–5.1)
Sodium: 137 mmol/L (ref 135–145)

## 2022-09-26 LAB — CBC
HCT: 25.3 % — ABNORMAL LOW (ref 39.0–52.0)
Hemoglobin: 7.9 g/dL — ABNORMAL LOW (ref 13.0–17.0)
MCH: 28.1 pg (ref 26.0–34.0)
MCHC: 31.2 g/dL (ref 30.0–36.0)
MCV: 90 fL (ref 80.0–100.0)
Platelets: 220 10*3/uL (ref 150–400)
RBC: 2.81 MIL/uL — ABNORMAL LOW (ref 4.22–5.81)
RDW: 14.2 % (ref 11.5–15.5)
WBC: 15.2 10*3/uL — ABNORMAL HIGH (ref 4.0–10.5)
nRBC: 0 % (ref 0.0–0.2)

## 2022-09-26 LAB — GLUCOSE, CAPILLARY
Glucose-Capillary: 94 mg/dL (ref 70–99)
Glucose-Capillary: 141 mg/dL — ABNORMAL HIGH (ref 70–99)
Glucose-Capillary: 116 mg/dL — ABNORMAL HIGH (ref 70–99)
Glucose-Capillary: 148 mg/dL — ABNORMAL HIGH (ref 70–99)

## 2022-09-26 LAB — URINALYSIS, ROUTINE W REFLEX MICROSCOPIC
Bilirubin Urine: NEGATIVE
Glucose, UA: NEGATIVE mg/dL
Hgb urine dipstick: NEGATIVE
Ketones, ur: NEGATIVE mg/dL
Leukocytes,Ua: NEGATIVE
Nitrite: NEGATIVE
Protein, ur: 100 mg/dL — AB
Specific Gravity, Urine: 1.013 (ref 1.005–1.030)
pH: 5 (ref 5.0–8.0)

## 2022-09-26 LAB — HEMOGLOBIN A1C
Hgb A1c MFr Bld: 5.6 % (ref 4.8–5.6)
Mean Plasma Glucose: 114.02 mg/dL

## 2022-09-26 MED ORDER — PANTOPRAZOLE SODIUM 40 MG PO TBEC
40.0000 mg | DELAYED_RELEASE_TABLET | Freq: Every day | ORAL | Status: DC
  Administered 2022-09-26 – 2022-10-01 (×6): 40 mg via ORAL
  Filled 2022-09-26 (×6): qty 1

## 2022-09-26 MED ORDER — NALOXONE HCL 0.4 MG/ML IJ SOLN: 0.4000 mg | INTRAMUSCULAR | Status: DC | PRN

## 2022-09-26 MED ORDER — MAGNESIUM OXIDE -MG SUPPLEMENT 400 (240 MG) MG PO TABS
400.0000 mg | ORAL_TABLET | Freq: Every day | ORAL | Status: DC
  Administered 2022-09-26 – 2022-10-01 (×6): 400 mg via ORAL
  Filled 2022-09-26 (×6): qty 1

## 2022-09-26 MED ORDER — ADULT MULTIVITAMIN W/MINERALS CH
1.0000 | ORAL_TABLET | Freq: Every day | ORAL | Status: DC
  Administered 2022-09-26 – 2022-10-01 (×6): 1 via ORAL
  Filled 2022-09-26 (×6): qty 1

## 2022-09-26 MED ORDER — TAMSULOSIN HCL 0.4 MG PO CAPS
0.8000 mg | ORAL_CAPSULE | Freq: Every day | ORAL | Status: DC
  Administered 2022-09-26 – 2022-10-01 (×6): 0.8 mg via ORAL
  Filled 2022-09-26 (×6): qty 2

## 2022-09-26 MED ORDER — AMITRIPTYLINE HCL 25 MG PO TABS
75.0000 mg | ORAL_TABLET | Freq: Every day | ORAL | Status: DC
  Administered 2022-09-26 – 2022-09-30 (×5): 75 mg via ORAL
  Filled 2022-09-26 (×5): qty 3

## 2022-09-26 MED ORDER — DOCUSATE SODIUM 100 MG PO CAPS
100.0000 mg | ORAL_CAPSULE | Freq: Every day | ORAL | Status: DC
  Administered 2022-09-26 – 2022-09-28 (×3): 100 mg via ORAL
  Filled 2022-09-26 (×3): qty 1

## 2022-09-26 MED ORDER — OXYCODONE HCL 5 MG PO TABS
5.0000 mg | ORAL_TABLET | Freq: Four times a day (QID) | ORAL | Status: DC | PRN
  Administered 2022-09-29 – 2022-09-30 (×2): 5 mg via ORAL
  Filled 2022-09-26 (×4): qty 1

## 2022-09-26 MED ORDER — POLYVINYL ALCOHOL 1.4 % OP SOLN
1.0000 [drp] | Freq: Every day | OPHTHALMIC | Status: DC
  Administered 2022-09-26 – 2022-10-01 (×6): 1 [drp] via OPHTHALMIC
  Filled 2022-09-26: qty 15

## 2022-09-26 MED ORDER — METOPROLOL SUCCINATE ER 25 MG PO TB24
12.5000 mg | ORAL_TABLET | Freq: Two times a day (BID) | ORAL | Status: DC
  Administered 2022-09-26 – 2022-10-01 (×10): 12.5 mg via ORAL
  Filled 2022-09-26 (×10): qty 1

## 2022-09-26 MED ORDER — VENLAFAXINE HCL ER 75 MG PO CP24
225.0000 mg | ORAL_CAPSULE | Freq: Every day | ORAL | Status: DC
  Administered 2022-09-26 – 2022-10-01 (×6): 225 mg via ORAL
  Filled 2022-09-26 (×7): qty 3

## 2022-09-26 MED ORDER — GABAPENTIN 300 MG PO CAPS
300.0000 mg | ORAL_CAPSULE | Freq: Two times a day (BID) | ORAL | Status: DC
  Administered 2022-09-26 – 2022-10-01 (×10): 300 mg via ORAL
  Filled 2022-09-26 (×10): qty 1

## 2022-09-26 MED ORDER — LACTATED RINGERS IV SOLN: INTRAVENOUS | Status: DC

## 2022-09-26 MED ORDER — HYDROMORPHONE HCL 1 MG/ML IJ SOLN
0.5000 mg | INTRAMUSCULAR | Status: DC | PRN
  Administered 2022-09-26 – 2022-09-30 (×10): 0.5 mg via INTRAVENOUS
  Filled 2022-09-26 (×10): qty 0.5

## 2022-09-26 MED ORDER — FERROUS GLUCONATE 324 (38 FE) MG PO TABS
325.0000 mg | ORAL_TABLET | Freq: Every day | ORAL | Status: DC
  Administered 2022-09-26 – 2022-10-01 (×6): 325 mg via ORAL
  Filled 2022-09-26 (×6): qty 2

## 2022-09-26 MED ORDER — VITAMIN D 25 MCG (1000 UNIT) PO TABS
2000.0000 [IU] | ORAL_TABLET | Freq: Every day | ORAL | Status: DC
  Administered 2022-09-26 – 2022-10-01 (×6): 2000 [IU] via ORAL
  Filled 2022-09-26 (×5): qty 2

## 2022-09-26 NOTE — Care Management Important Message (Signed)
Important Message  Patient Details  Name: Shane Chambers MRN: [id number] Date of Birth: 21-Apr-1961   Medicare Important Message Given:  Yes     Tommy Medal 09/26/2022, 2:28 PM

## 2022-09-26 NOTE — Progress Notes (Signed)
PROGRESS NOTE   Shane Chambers  AVW:[id number]    DOB: 21-Sep-1961    DOA: 09/25/2022  PCP: Caprice Renshaw, MD   I have briefly reviewed patients previous medical records in [facility].  Chief Complaint  Patient presents with   Abnormal Lab    Brief Narrative:  61 year old male, nursing home resident for the last 2 years, ambulates some with a walker, PMH of DM, chronic systolic and diastolic CHF, CABG x5, OSA, HTN, COPD, rectal cancer, presented to the [facility] ED on 09/25/2022 with complaints of pain and redness to bilateral legs but significantly worse on the right and noted to have leukocytosis.  History of repeatedly bumping his legs while walking and has multiple healing wounds on his legs.  Hospitalized 6/13 - 6/23 for bilateral lower extremity cellulitis when he was treated with IV vancomycin and ceftriaxone and then switched to Zyvox and discharged on doxycycline.   Assessment & Plan:  Principal Problem:   Cellulitis of right lower extremity Active Problems:   Severe sepsis (HCC)   Hypertension   Type 2 diabetes mellitus (HCC)   S/P CABG x 5   OSA (obstructive sleep apnea)   Systolic and diastolic CHF, chronic (HCC)   AKI (acute kidney injury) ([place])   Cellulitis of right lower extremity Sepsis ruled out.  Temperature of 100.3 F on ED arrival but no persistent tachypnea, tachycardia.  Normotensive and not hypoxic.  WBC 21.4.  Mildly elevated lactate which is nearly normalized.  Likely precipitated by skin breakdown of his legs related to frequently bumping his legs during ambulatory dysfunction.  Continue empirically started IV vancomycin and ceftriaxone.  Follow blood cultures.  RLE venous ultrasound negative for DVT.  Reduce IV fluids due to concern for leg edema.  May consider bilateral leg compression stockings.     Acute kidney injury Creatinine 1.08 in June 2023.  Presented with creatinine of 2.19.  Creatinine has gradually crept up since June to  current.  Hold diuretics and ARB's.  Avoid nephrotoxic.  Continue gentle hydration.  Follow BMP in AM.  Creatinine unchanged compared to yesterday, 2.19 > 2.17.  Renal ultrasound.  Elevated creatinine may be related to diuretics and ARB.   Systolic and diastolic CHF, chronic (HCC) Appears stable and compensated.  Weight per chart appears stable. Last echo 2021, EF of 55 to 60% with grade 2 DD. Hold torsemide 60 mg daily for now and could consider initiating in a.m.   OSA (obstructive sleep apnea) CPAP QHS   S/P CABG x 5 Reports chronic dyspnea on exertion over the past several years, unchanged.  EKG unchanged.  Continue statins and aspirin.   Type 2 diabetes mellitus (HCC) Check A1c.  Holding home insulins.  Treating with SSI.  Adjust insulins as needed.   Hypertension Soft blood pressures.  Holding losartan, metoprolol and torsemide for now in the setting of low BP and AKI.  Anemia of chronic disease:  Baseline hemoglobin probably ranges between 8-10.  Presented with hemoglobin of 9.2 which is dropped to 7.9, secondary to IV hydration probably.  No overt bleeding.  Follow CBC in AM.  Functional quadriplegia: Unclear etiology.  Has significant wasting of distal muscles of his upper extremity and may be contractures of his fingers/hands.  Outpatient evaluation including neurology consultation if he has not had 1.  Body mass index is 37.66 kg/m.  Obesity   DVT prophylaxis: heparin injection 5,000 Units Start: 09/25/22 2245     Code Status: Full Code:  Family Communication: None at bedside Disposition:  Status is: Inpatient Remains inpatient appropriate because: Significant cellulitis of right lower extremity with need for IV antibiotics.  AKI on IV fluids.     Consultants:     Procedures:     Antimicrobials:   IV ceftriaxone and vancomycin.   Subjective:  Feeling hot and cold.  States that he is barely able to walk with a walker and bumps into things frequently.  No chest  pain or dyspnea reported.  Objective:   Vitals:   09/26/22 0011 09/26/22 0046 09/26/22 0322 09/26/22 0752  BP: (!) 79/57 (!) 109/49 (!) 100/52   Pulse: 79 78 81   Resp: (!) 22 (!) 22 18   Temp: 98.4 F (36.9 C) 98.4 F (36.9 C) 97.6 F (36.4 C)   TempSrc: Oral  Oral   SpO2: 97% 97%  99%  Weight:      Height:        General exam: Young male, looks older than stated age, moderately built and obese lying comfortably propped up in bed without distress. Respiratory system: Clear to auscultation. Respiratory effort normal. Cardiovascular system: S1 & S2 heard, RRR. No JVD, murmurs, rubs, gallops or clicks. No pedal edema.  Not on telemetry. Gastrointestinal system: Abdomen is nondistended, soft and nontender. No organomegaly or masses felt. Normal bowel sounds heard. Central nervous system: Alert and oriented. No focal neurological deficits. Extremities: Grade 4 x 5 power in upper extremities and may be 3 x 5 power in lower extremities.  Significant distal muscle wasting of upper extremities especially the hands and fingers with may be contractures on the left hand. Skin: Right leg with significant erythema, warmth, tenderness, swelling but no crepitus or fluctuation.  Details as in picture below from admission.  Has multiple scabs of healing wounds on bilateral legs.  Also has a small area of redness over the mid shin on the left side but no overt cellulitis. Psychiatry: Judgement and insight appear normal. Mood & affect appropriate.     Data Reviewed:   I have personally reviewed following labs and imaging studies   CBC: Recent Labs  Lab 09/25/22 1720 09/26/22 0404  WBC 21.4* 15.2*  NEUTROABS 18.8*  --   HGB 9.2* 7.9*  HCT 29.1* 25.3*  MCV 89.5 90.0  PLT 282 026    Basic Metabolic Panel: Recent Labs  Lab 09/25/22 1720 09/26/22 0404  NA 135 137  K 4.7 4.7  CL 99 103  CO2 25 24  GLUCOSE 99 103*  BUN 75* 75*  CREATININE 2.19* 2.17*  CALCIUM 9.4 8.8*    Liver  Function Tests: Recent Labs  Lab 09/25/22 1720  AST 49*  ALT 34  ALKPHOS 90  BILITOT 0.4  PROT 8.0  ALBUMIN 3.3*    CBG: Recent Labs  Lab 09/26/22 0713  GLUCAP 94    Microbiology Studies:   Recent Results (from the past 240 hour(s))  Blood Culture (routine x 2)     Status: None (Preliminary result)   Collection Time: 09/25/22  5:20 PM   Specimen: Right Antecubital; Blood  Result Value Ref Range Status   Specimen Description RIGHT ANTECUBITAL  Final   Special Requests   Final    BOTTLES DRAWN AEROBIC AND ANAEROBIC Blood Culture results may not be optimal due to an inadequate volume of blood received in culture bottles   Culture   Final    NO GROWTH < 24 HOURS Performed at [facility], [address] [place], [place]  [address]    Report Status PENDING  Incomplete  Resp Panel by RT-PCR (Flu A&B, Covid) Anterior Nasal Swab     Status: None   Collection Time: 09/25/22  5:59 PM   Specimen: Anterior Nasal Swab  Result Value Ref Range Status   SARS Coronavirus 2 by RT PCR NEGATIVE NEGATIVE Final    Comment: (NOTE) SARS-CoV-2 target nucleic acids are NOT DETECTED.  The SARS-CoV-2 RNA is generally detectable in upper respiratory specimens during the acute phase of infection. The lowest concentration of SARS-CoV-2 viral copies this assay can detect is 138 copies/mL. A negative result does not preclude SARS-Cov-2 infection and should not be used as the sole basis for treatment or other patient management decisions. A negative result may occur with  improper specimen collection/handling, submission of specimen other than nasopharyngeal swab, presence of viral mutation(s) within the areas targeted by this assay, and inadequate number of viral copies(<138 copies/mL). A negative result must be combined with clinical observations, patient history, and epidemiological information. The expected result is Negative.  Fact Sheet for Patients:   EntrepreneurPulse.com.au  Fact Sheet for Healthcare Providers:  IncredibleEmployment.be  This test is no t yet approved or cleared by the [place] FDA and  has been authorized for detection and/or diagnosis of SARS-CoV-2 by FDA under an Emergency Use Authorization (EUA). This EUA will remain  in effect (meaning this test can be used) for the duration of the COVID-19 declaration under Section 564(b)(1) of the Act, 21 U.S.C.section 360bbb-3(b)(1), unless the authorization is terminated  or revoked sooner.       Influenza A by PCR NEGATIVE NEGATIVE Final   Influenza B by PCR NEGATIVE NEGATIVE Final    Comment: (NOTE) The Xpert Xpress SARS-CoV-2/FLU/RSV plus assay is intended as an aid in the diagnosis of influenza from Nasopharyngeal swab specimens and should not be used as a sole basis for treatment. Nasal washings and aspirates are unacceptable for Xpert Xpress SARS-CoV-2/FLU/RSV testing.  Fact Sheet for Patients: EntrepreneurPulse.com.au  Fact Sheet for Healthcare Providers: IncredibleEmployment.be  This test is not yet approved or cleared by the [place] FDA and has been authorized for detection and/or diagnosis of SARS-CoV-2 by FDA under an Emergency Use Authorization (EUA). This EUA will remain in effect (meaning this test can be used) for the duration of the COVID-19 declaration under Section 564(b)(1) of the Act, 21 U.S.C. section 360bbb-3(b)(1), unless the authorization is terminated or revoked.  Performed at [facility], [address], [place], [place] [address]   Blood Culture (routine x 2)     Status: None (Preliminary result)   Collection Time: 09/25/22  6:00 PM   Specimen: Left Antecubital; Blood  Result Value Ref Range Status   Specimen Description LEFT ANTECUBITAL  Final   Special Requests   Final    BOTTLES DRAWN AEROBIC ONLY Blood Culture adequate volume   Culture   Final     NO GROWTH < 24 HOURS Performed at [facility], [address], [place],  [address]    Report Status PENDING  Incomplete    Radiology Studies:  US Venous Img Lower Unilateral Right (DVT)  Result Date: 09/26/2022 CLINICAL DATA:  Leg swelling EXAM: RIGHT LOWER EXTREMITY VENOUS DOPPLER ULTRASOUND TECHNIQUE: Gray-scale sonography with compression, as well as color and duplex ultrasound, were performed to evaluate the deep venous system(s) from the level of the common femoral vein through the popliteal and proximal calf veins. COMPARISON:  04/25/2022 FINDINGS: VENOUS Normal compressibility of the common femoral, superficial femoral,  and popliteal veins, as well as the visualized calf veins. Visualized portions of profunda femoral vein and great saphenous vein unremarkable. No filling defects to suggest DVT on grayscale or color Doppler imaging. Doppler waveforms show normal direction of venous flow, normal respiratory plasticity and response to augmentation. Limited views of the contralateral common femoral vein are unremarkable. OTHER None. Limitations: none IMPRESSION: No right lower extremity DVT Electronically Signed   By: Miachel Roux M.D.   On: 09/26/2022 08:47   DG Chest Port 1 View  Result Date: 09/25/2022 CLINICAL DATA:  Sepsis, elevated white blood cell count, lower extremity erythema and swelling EXAM: PORTABLE CHEST 1 VIEW COMPARISON:  04/22/2022 FINDINGS: Single frontal view of the chest was obtained in lordotic positioning. Cardiac silhouette is unremarkable. Postsurgical changes from median sternotomy. No airspace disease, effusion, or pneumothorax. No acute bony abnormalities. IMPRESSION: 1. No acute intrathoracic process. Electronically Signed   By: Randa Ngo M.D.   On: 09/25/2022 17:26    Scheduled Meds:    aspirin EC  81 mg Oral Q breakfast   atorvastatin  80 mg Oral QHS   heparin  5,000 Units Subcutaneous Q8H   insulin aspart  0-5 Units Subcutaneous QHS   insulin  aspart  0-9 Units Subcutaneous TID WC   levothyroxine  75 mcg Oral Q0600   mometasone-formoterol  2 puff Inhalation BID    Continuous Infusions:    cefTRIAXone (ROCEPHIN)  IV     lactated ringers 75 mL/hr at 09/26/22 0100   vancomycin       LOS: 1 day     Vernell Leep, MD,  FACP, [facility], [facility], [facility], [place]     To contact the attending provider between 7A-7P or the covering provider during after hours 7P-7A, please log into the web site [email or web address] and access using universal [place] password for that web site. If you do not have the password, please call the hospital operator.  09/26/2022, 10:05 AM

## 2022-09-26 NOTE — TOC Initial Note (Signed)
Transition of Care [facility]) - Initial/Assessment Note    Patient Details  Name: Shane Chambers MRN: [id number] Date of Birth: 1961-04-26  [facility]) CM/SW Contact:    Iona Beard, [place] Phone Number: 09/26/2022, 1:17 PM  Clinical Narrative:                 Pt admitted from [facility]. CSW spoke to [place] with the facility who states that pt is a long term resident. Pt can return at D/C. CSW started pts Fl2, to be completed at D/C. TOC to follow.   Expected Discharge Plan: Long Term Nursing Home Barriers to Discharge: Continued Medical Work up   Patient Goals and CMS Choice Patient states their goals for this hospitalization and ongoing recovery are:: return to LTC CMS Medicare.gov Compare Post Acute Care list provided to:: Patient Choice offered to / list presented to : Patient  Expected Discharge Plan and Services Expected Discharge Plan: Long Term Nursing Home In-house Referral: Clinical Social Work Discharge Planning Services: CM Consult Post Acute Care Choice: Nursing Home Living arrangements for the past 2 months: [place]                                      Prior Living Arrangements/Services Living arrangements for the past 2 months: [place] Lives with:: Facility Resident Patient language and need for interpreter reviewed:: Yes Do you feel safe going back to the place where you live?: Yes      Need for Family Participation in Patient Care: Yes (Comment) Care giver support system in place?: Yes (comment)   Criminal Activity/Legal Involvement Pertinent to Current Situation/Hospitalization: No - Comment as needed  Activities of Daily Living Home Assistive Devices/Equipment: Wheelchair ADL Screening (condition at time of admission) Patient's cognitive ability adequate to safely complete daily activities?: Yes Is the patient deaf or have difficulty hearing?: No Does the patient have  difficulty seeing, even when wearing glasses/contacts?: No Does the patient have difficulty concentrating, remembering, or making decisions?: No Patient able to express need for assistance with ADLs?: Yes Does the patient have difficulty dressing or bathing?: Yes Independently performs ADLs?: No Communication: Independent Dressing (OT): Needs assistance Is this a change from baseline?: Pre-admission baseline Grooming: Needs assistance Is this a change from baseline?: Pre-admission baseline Feeding: Independent Bathing: Dependent Is this a change from baseline?: Pre-admission baseline Toileting: Needs assistance Is this a change from baseline?: Pre-admission baseline In/Out Bed: Dependent Is this a change from baseline?: Change from baseline, expected to last <3 days Walks in Home: Needs assistance, Dependent Is this a change from baseline?: Change from baseline, expected to last <3 days Does the patient have difficulty walking or climbing stairs?: Yes Weakness of Legs: Both Weakness of Arms/Hands: Both  Permission Sought/Granted                  Emotional Assessment Appearance:: Appears older than stated age       Alcohol / Substance Use: Not Applicable Psych Involvement: No (comment)  Admission diagnosis:  Cellulitis of right foot [L03.115] Cellulitis of right lower extremity [L03.115] Patient Active Problem List   Diagnosis Date Noted   Cellulitis of right lower extremity 09/25/2022   Lactic acidosis 04/23/2022   Anemia of chronic disease 04/23/2022   Obesity, Class III, BMI 40-49.9 (morbid obesity) ([place]) 04/23/2022   AKI (acute kidney injury) ([place]) 04/22/2022  Elevated troponin 04/22/2022   Cancer of sigmoid colon ([place]) 12/16/2021   Constipation 12/16/2021   GERD (gastroesophageal reflux disease) 12/16/2021   Iron deficiency anemia 09/17/2021   Rectal cancer ([place]) 09/03/2021   Bilateral lower leg cellulitis 16/08/9603   Systolic and diastolic CHF, chronic  ([place]) 11/22/2020   CHF exacerbation ([place]) 08/01/2020   Visit for wound check 05/28/2020   OSA (obstructive sleep apnea) 05/24/2020   Acute exacerbation of chronic obstructive pulmonary disease (COPD) ([place]) 04/29/2020   Leukocytosis 04/29/2020   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   Ischemic cardiomyopathy    Coronary artery disease    Acute on chronic combined systolic and diastolic CHF (congestive heart failure) ([place]) 03/05/2020   Acute hyponatremia 03/05/2020   Severe Vitamin D deficiency 12/02/2019   Hypokalemia 12/01/2019   Hyperlipidemia 12/01/2019   BPH (benign prostatic hyperplasia) 12/01/2019   Normocytic anemia 12/01/2019   Pneumonia due to COVID-19 virus 12/01/2019   Hypoxia    SOB (shortness of breath) 11/16/2019   Severe sepsis ([place]) 11/16/2019   Hypertension    Type 2 diabetes mellitus ([place])    Depression    Acute bronchitis with bronchospasm 11/15/2019   PCP:  Caprice Renshaw, MD Pharmacy:   [facility], [place] [address] [place] [address] Phone: [phone] Fax: [phone]     Social Determinants of Health (SDOH) Interventions Housing Interventions: Intervention Not Indicated  Readmission Risk Interventions    09/26/2022    1:16 PM 04/23/2022   10:20 AM 11/25/2020   10:15 AM  Readmission Risk Prevention Plan  Transportation Screening Complete Complete Complete  PCP or Specialist Appt within 3-5 Days Complete Complete   HRI or Home Care Consult Complete Complete   Social Work Consult for [place] Planning/Counseling Complete Complete   Palliative Care Screening Not Applicable Not Applicable   Medication Review Press photographer) Complete Complete Complete  HRI or Home Care Consult   Complete  SW Recovery Care/Counseling Consult   Complete  Palliative Care Screening   Not Applicable  Skilled Nursing Facility   Complete

## 2022-09-27 DIAGNOSIS — L03115 Cellulitis of right lower limb: Secondary | ICD-10-CM | POA: Diagnosis not present

## 2022-09-27 LAB — CBC
HCT: 25.5 % — ABNORMAL LOW (ref 39.0–52.0)
Hemoglobin: 8 g/dL — ABNORMAL LOW (ref 13.0–17.0)
MCH: 28.3 pg (ref 26.0–34.0)
MCHC: 31.4 g/dL (ref 30.0–36.0)
MCV: 90.1 fL (ref 80.0–100.0)
Platelets: 264 10*3/uL (ref 150–400)
RBC: 2.83 MIL/uL — ABNORMAL LOW (ref 4.22–5.81)
RDW: 14 % (ref 11.5–15.5)
WBC: 12.6 10*3/uL — ABNORMAL HIGH (ref 4.0–10.5)
nRBC: 0 % (ref 0.0–0.2)

## 2022-09-27 LAB — BASIC METABOLIC PANEL
Anion gap: 9 (ref 5–15)
BUN: 58 mg/dL — ABNORMAL HIGH (ref 6–20)
CO2: 25 mmol/L (ref 22–32)
Calcium: 8.6 mg/dL — ABNORMAL LOW (ref 8.9–10.3)
Chloride: 103 mmol/L (ref 98–111)
Creatinine, Ser: 1.71 mg/dL — ABNORMAL HIGH (ref 0.61–1.24)
GFR, Estimated: 46 mL/min — ABNORMAL LOW (ref >60)
Glucose, Bld: 134 mg/dL — ABNORMAL HIGH (ref 70–99)
Potassium: 4.2 mmol/L (ref 3.5–5.1)
Sodium: 137 mmol/L (ref 135–145)

## 2022-09-27 LAB — GLUCOSE, CAPILLARY
Glucose-Capillary: 128 mg/dL — ABNORMAL HIGH (ref 70–99)
Glucose-Capillary: 116 mg/dL — ABNORMAL HIGH (ref 70–99)
Glucose-Capillary: 149 mg/dL — ABNORMAL HIGH (ref 70–99)
Glucose-Capillary: 172 mg/dL — ABNORMAL HIGH (ref 70–99)

## 2022-09-27 MED ORDER — SODIUM CHLORIDE 0.9 % IV SOLN
2.0000 g | INTRAVENOUS | Status: DC
  Administered 2022-09-27 – 2022-09-29 (×3): 2 g via INTRAVENOUS
  Filled 2022-09-27 (×3): qty 20

## 2022-09-27 MED ORDER — SODIUM CHLORIDE 0.9 % IV SOLN
INTRAVENOUS | Status: AC
  Filled 2022-09-27: qty 100

## 2022-09-27 MED ORDER — SODIUM CHLORIDE 0.9 % IV SOLN
100.0000 mg | Freq: Two times a day (BID) | INTRAVENOUS | Status: DC
  Administered 2022-09-27 – 2022-09-29 (×4): 100 mg via INTRAVENOUS
  Filled 2022-09-27 (×7): qty 100

## 2022-09-27 MED ORDER — TORSEMIDE 20 MG PO TABS
40.0000 mg | ORAL_TABLET | Freq: Every day | ORAL | Status: DC
  Administered 2022-09-27 – 2022-10-01 (×5): 40 mg via ORAL
  Filled 2022-09-27 (×5): qty 2

## 2022-09-27 NOTE — Progress Notes (Signed)
Prelim blood cultures from 11/17 positive for gram positive rods in anaerobic bottle. Discussed with Dr. Candiss Norse, ID MD on call and as per recommendations, DC'd Vancomycin and started IV Doxycycline and Ceftriaxone.

## 2022-09-27 NOTE — Plan of Care (Signed)

## 2022-09-27 NOTE — Progress Notes (Addendum)
PROGRESS NOTE   Shane Chambers  AYT:[id number]    DOB: May 13, 1961    DOA: 09/25/2022  PCP: Caprice Renshaw, MD   I have briefly reviewed patients previous medical records in [facility].  Chief Complaint  Patient presents with   Abnormal Lab    Brief Narrative:  61 year old male, nursing home resident for the last 2 years, ambulates some with a walker, PMH of DM, chronic systolic and diastolic CHF, CABG x5, OSA, HTN, COPD, rectal cancer, presented to the [facility] ED on 09/25/2022 with complaints of pain and redness to bilateral legs but significantly worse on the right and noted to have leukocytosis.  History of repeatedly bumping his legs while walking and has multiple healing wounds on his legs.  Hospitalized 6/13 - 6/23 for bilateral lower extremity cellulitis when he was treated with IV vancomycin and ceftriaxone and then switched to Zyvox and discharged on doxycycline.  Improving.   Assessment & Plan:  Principal Problem:   Cellulitis of right lower extremity Active Problems:   Severe sepsis (HCC)   Hypertension   Type 2 diabetes mellitus (HCC)   S/P CABG x 5   OSA (obstructive sleep apnea)   Systolic and diastolic CHF, chronic (HCC)   AKI (acute kidney injury) ([place])   Cellulitis of right lower extremity Sepsis ruled out.  Temperature of 100.3 F on ED arrival but no persistent tachypnea, tachycardia.  Normotensive and not hypoxic.  WBC 21.4.  Mildly elevated lactate which is nearly normalized.  Likely precipitated by skin breakdown of his legs related to frequently bumping his legs during ambulatory dysfunction.  Continue empirically started IV vancomycin and ceftriaxone.  Blood cultures x2: Negative to date.  RLE venous ultrasound negative for DVT.  Bilateral leg compression stockings placed.  Completed IV fluids.  Improving.  Appears to have some bilateral edema.  Continue to hold ARB but resume torsemide.  His creatinine currently is about the same what it was on  11/8.   Acute kidney injury Creatinine 1.08 in June 2023.  Presented with creatinine of 2.19.  Creatinine has gradually crept up since June to current.  Hold diuretics and ARB's.  Avoid nephrotoxic.  Completed gentle hydration.  Creatinine unchanged compared to yesterday, 2.19 > 2.17>1.71.  Renal ultrasound unremarkable.  Elevated creatinine may be related to diuretics and ARB.  Resume torsemide but continue to hold ARB and follow BMP daily   Systolic and diastolic CHF, chronic (HCC) Appears stable and compensated.  Weight per chart appears stable. Last echo 2021, EF of 55 to 60% with grade 2 DD.  Resumed home dose of torsemide.   OSA (obstructive sleep apnea) CPAP QHS   S/P CABG x 5 Reports chronic dyspnea on exertion over the past several years, unchanged.  EKG unchanged.  Continue statins and aspirin.   Type 2 diabetes mellitus (HCC) Check A1c.  Holding home insulins.  Treating with SSI.  Adjust insulins as needed.  A1c 5.6 indicates good control.   Hypertension Had soft blood pressures and antihypertensives and diuretics were briefly held.  Was resumed on metoprolol yesterday.  Continue to hold losartan.  Resume torsemide today.  Anemia of chronic disease:  Baseline hemoglobin probably ranges between 8-10.  Presented with hemoglobin of 9.2 which is dropped to 7.9, secondary to IV hydration probably.  No overt bleeding.  Hemoglobin stable at 8 g per DL.  Follow CBC in AM.  Functional quadriplegia: Unclear etiology.  Has significant wasting of distal muscles of his upper extremity  and may be contractures of his fingers/hands.  Outpatient evaluation including neurology consultation if he has not had one.  Patient may return to his prior LTC facility when medically stabilized even on weekends.  Body mass index is 37.66 kg/m.  Obesity   DVT prophylaxis: Place TED hose Start: 09/26/22 1024 heparin injection 5,000 Units Start: 09/25/22 2245     Code Status: Full Code:  Family  Communication: None at bedside Disposition:  Status is: Inpatient Remains inpatient appropriate because: Significant cellulitis of right lower extremity with need for IV antibiotics.  Hopeful DC back to LTC SNF in 1 to 2 days.     Consultants:     Procedures:     Antimicrobials:   IV ceftriaxone and vancomycin.   Subjective:  Limited historian.  Right leg feels better.  Denies dyspnea.  Interviewed and examined along with his male nurse in the room.  Objective:   Vitals:   09/26/22 1723 09/26/22 2045 09/27/22 0459 09/27/22 0933  BP: 116/71 (!) 140/78 139/65   Pulse: 77 86 85   Resp:  16 18   Temp: 98.4 F (36.9 C) 98.4 F (36.9 C) 98.1 F (36.7 C)   TempSrc: Oral Oral Oral   SpO2: 98% 99% 96% (!) 86%  Weight:      Height:        General exam: Young male, looks older than stated age, moderately built and obese lying comfortably propped up in bed without distress. Respiratory system: Occasional bilateral rhonchi and slightly harsh breath sounds but no increased work of breathing. Cardiovascular system: S1 & S2 heard, RRR. No JVD, murmurs, rubs, gallops or clicks.  Trace to 1+ bilateral leg edema, right greater than left.  Telemetry personally reviewed: Sinus rhythm. Gastrointestinal system: Abdomen is nondistended, soft and nontender. No organomegaly or masses felt. Normal bowel sounds heard. Central nervous system: Alert and oriented. No focal neurological deficits. Extremities: Grade 4 x 5 power in upper extremities and may be 3 x 5 power in lower extremities.  Significant distal muscle wasting of upper extremities especially the hands and fingers with may be contractures on the left hand. Skin: Now has bilateral leg compression stockings.  Improving erythema and warmth of right leg but still tender to touch.  Has multiple scabs of healing wounds on bilateral legs.  Also has a small area of redness over the mid shin on the left side but no overt cellulitis. Psychiatry:  Judgement and insight appear normal. Mood & affect appropriate.   Below picture taken on admission.   Data Reviewed:   I have personally reviewed following labs and imaging studies   CBC: Recent Labs  Lab 09/25/22 1720 09/26/22 0404 09/27/22 0617  WBC 21.4* 15.2* 12.6*  NEUTROABS 18.8*  --   --   HGB 9.2* 7.9* 8.0*  HCT 29.1* 25.3* 25.5*  MCV 89.5 90.0 90.1  PLT 282 220 962    Basic Metabolic Panel: Recent Labs  Lab 09/25/22 1720 09/26/22 0404 09/27/22 0617  NA 135 137 137  K 4.7 4.7 4.2  CL 99 103 103  CO2 '25 24 25  '$ GLUCOSE 99 103* 134*  BUN 75* 75* 58*  CREATININE 2.19* 2.17* 1.71*  CALCIUM 9.4 8.8* 8.6*    Liver Function Tests: Recent Labs  Lab 09/25/22 1720  AST 49*  ALT 34  ALKPHOS 90  BILITOT 0.4  PROT 8.0  ALBUMIN 3.3*    CBG: Recent Labs  Lab 09/26/22 1623 09/26/22 2254 09/27/22 0703  GLUCAP 116* 148*  128*    Microbiology Studies:   Recent Results (from the past 240 hour(s))  Blood Culture (routine x 2)     Status: None (Preliminary result)   Collection Time: 09/25/22  5:20 PM   Specimen: Right Antecubital; Blood  Result Value Ref Range Status   Specimen Description RIGHT ANTECUBITAL  Final   Special Requests   Final    BOTTLES DRAWN AEROBIC AND ANAEROBIC Blood Culture results may not be optimal due to an inadequate volume of blood received in culture bottles   Culture   Final    NO GROWTH 2 DAYS Performed at [facility], [address] [place], [place] [address]    Report Status PENDING  Incomplete  Resp Panel by RT-PCR (Flu A&B, Covid) Anterior Nasal Swab     Status: None   Collection Time: 09/25/22  5:59 PM   Specimen: Anterior Nasal Swab  Result Value Ref Range Status   SARS Coronavirus 2 by RT PCR NEGATIVE NEGATIVE Final    Comment: (NOTE) SARS-CoV-2 target nucleic acids are NOT DETECTED.  The SARS-CoV-2 RNA is generally detectable in upper respiratory specimens during the acute phase of infection. The  lowest concentration of SARS-CoV-2 viral copies this assay can detect is 138 copies/mL. A negative result does not preclude SARS-Cov-2 infection and should not be used as the sole basis for treatment or other patient management decisions. A negative result may occur with  improper specimen collection/handling, submission of specimen other than nasopharyngeal swab, presence of viral mutation(s) within the areas targeted by this assay, and inadequate number of viral copies(<138 copies/mL). A negative result must be combined with clinical observations, patient history, and epidemiological information. The expected result is Negative.  Fact Sheet for Patients:  EntrepreneurPulse.com.au  Fact Sheet for Healthcare Providers:  IncredibleEmployment.be  This test is no t yet approved or cleared by the [place] FDA and  has been authorized for detection and/or diagnosis of SARS-CoV-2 by FDA under an Emergency Use Authorization (EUA). This EUA will remain  in effect (meaning this test can be used) for the duration of the COVID-19 declaration under Section 564(b)(1) of the Act, 21 U.S.C.section 360bbb-3(b)(1), unless the authorization is terminated  or revoked sooner.       Influenza A by PCR NEGATIVE NEGATIVE Final   Influenza B by PCR NEGATIVE NEGATIVE Final    Comment: (NOTE) The Xpert Xpress SARS-CoV-2/FLU/RSV plus assay is intended as an aid in the diagnosis of influenza from Nasopharyngeal swab specimens and should not be used as a sole basis for treatment. Nasal washings and aspirates are unacceptable for Xpert Xpress SARS-CoV-2/FLU/RSV testing.  Fact Sheet for Patients: EntrepreneurPulse.com.au  Fact Sheet for Healthcare Providers: IncredibleEmployment.be  This test is not yet approved or cleared by the [place] FDA and has been authorized for detection and/or diagnosis of SARS-CoV-2 by FDA under  an Emergency Use Authorization (EUA). This EUA will remain in effect (meaning this test can be used) for the duration of the COVID-19 declaration under Section 564(b)(1) of the Act, 21 U.S.C. section 360bbb-3(b)(1), unless the authorization is terminated or revoked.  Performed at [facility], [address], [place], [place] [address]   Blood Culture (routine x 2)     Status: None (Preliminary result)   Collection Time: 09/25/22  6:00 PM   Specimen: Left Antecubital; Blood  Result Value Ref Range Status   Specimen Description LEFT ANTECUBITAL  Final   Special Requests   Final    BOTTLES DRAWN AEROBIC ONLY Blood  Culture adequate volume   Culture   Final    NO GROWTH 2 DAYS Performed at [facility], [address], [place], [place] [address]    Report Status PENDING  Incomplete    Radiology Studies:  US RENAL  Result Date: 10-19-22 CLINICAL DATA:  Acute kidney injury EXAM: RENAL / URINARY TRACT ULTRASOUND COMPLETE COMPARISON:  None available FINDINGS: Right Kidney: Renal measurements: 12.9 x 6.8 x 7.0 cm = volume: 323 mL. Echogenicity within normal limits. No mass or hydronephrosis visualized. Left Kidney: Renal measurements: 12.2 x 6.3 x 6.6 cm = volume: 265 mL. Echogenicity within normal limits. No mass or hydronephrosis visualized. Bladder: Unable to evaluate due to collapse configuration. Other: None. IMPRESSION: No significant sonographic abnormality of the kidneys. Electronically Signed   By: Miachel Roux M.D.   On: 19-Oct-2022 11:14   US Venous Img Lower Unilateral Right (DVT)  Result Date: 10/19/2022 CLINICAL DATA:  Leg swelling EXAM: RIGHT LOWER EXTREMITY VENOUS DOPPLER ULTRASOUND TECHNIQUE: Gray-scale sonography with compression, as well as color and duplex ultrasound, were performed to evaluate the deep venous system(s) from the level of the common femoral vein through the popliteal and proximal calf veins. COMPARISON:  04/25/2022 FINDINGS: VENOUS Normal compressibility of  the common femoral, superficial femoral, and popliteal veins, as well as the visualized calf veins. Visualized portions of profunda femoral vein and great saphenous vein unremarkable. No filling defects to suggest DVT on grayscale or color Doppler imaging. Doppler waveforms show normal direction of venous flow, normal respiratory plasticity and response to augmentation. Limited views of the contralateral common femoral vein are unremarkable. OTHER None. Limitations: none IMPRESSION: No right lower extremity DVT Electronically Signed   By: Miachel Roux M.D.   On: 2022/10/19 08:47   DG Chest Port 1 View  Result Date: 09/25/2022 CLINICAL DATA:  Sepsis, elevated white blood cell count, lower extremity erythema and swelling EXAM: PORTABLE CHEST 1 VIEW COMPARISON:  04/22/2022 FINDINGS: Single frontal view of the chest was obtained in lordotic positioning. Cardiac silhouette is unremarkable. Postsurgical changes from median sternotomy. No airspace disease, effusion, or pneumothorax. No acute bony abnormalities. IMPRESSION: 1. No acute intrathoracic process. Electronically Signed   By: Randa Ngo M.D.   On: 09/25/2022 17:26    Scheduled Meds:    amitriptyline  75 mg Oral QHS   aspirin EC  81 mg Oral Q breakfast   atorvastatin  80 mg Oral QHS   cholecalciferol  2,000 Units Oral Daily   docusate sodium  100 mg Oral Daily   ferrous gluconate  325 mg Oral Daily   gabapentin  300 mg Oral BID   heparin  5,000 Units Subcutaneous Q8H   insulin aspart  0-5 Units Subcutaneous QHS   insulin aspart  0-9 Units Subcutaneous TID WC   levothyroxine  75 mcg Oral Q0600   magnesium oxide  400 mg Oral Daily   metoprolol succinate  12.5 mg Oral BID   mometasone-formoterol  2 puff Inhalation BID   multivitamin with minerals  1 tablet Oral Daily   pantoprazole  40 mg Oral Daily   polyvinyl alcohol  1 drop Both Eyes Daily   tamsulosin  0.8 mg Oral Daily   venlafaxine XR  225 mg Oral Daily    Continuous Infusions:     vancomycin 1,000 mg (Oct 19, 2022 1825)     LOS: 2 days     Vernell Leep, MD,  FACP, FHM, SFHM, [facility], [place]  To contact the attending provider between 7A-7P or the covering provider during after hours 7P-7A, please log into the web site [email or web address] and access using universal [place] password for that web site. If you do not have the password, please call the hospital operator.  09/27/2022, 9:46 AM

## 2022-09-28 DIAGNOSIS — R7881 Bacteremia: Secondary | ICD-10-CM | POA: Diagnosis not present

## 2022-09-28 DIAGNOSIS — L03115 Cellulitis of right lower limb: Secondary | ICD-10-CM | POA: Diagnosis not present

## 2022-09-28 DIAGNOSIS — N179 Acute kidney failure, unspecified: Secondary | ICD-10-CM | POA: Diagnosis not present

## 2022-09-28 LAB — GLUCOSE, CAPILLARY
Glucose-Capillary: 212 mg/dL — ABNORMAL HIGH (ref 70–99)
Glucose-Capillary: 176 mg/dL — ABNORMAL HIGH (ref 70–99)
Glucose-Capillary: 139 mg/dL — ABNORMAL HIGH (ref 70–99)
Glucose-Capillary: 175 mg/dL — ABNORMAL HIGH (ref 70–99)

## 2022-09-28 LAB — BASIC METABOLIC PANEL
Anion gap: 10 (ref 5–15)
BUN: 43 mg/dL — ABNORMAL HIGH (ref 6–20)
CO2: 25 mmol/L (ref 22–32)
Calcium: 8.6 mg/dL — ABNORMAL LOW (ref 8.9–10.3)
Chloride: 102 mmol/L (ref 98–111)
Creatinine, Ser: 1.48 mg/dL — ABNORMAL HIGH (ref 0.61–1.24)
GFR, Estimated: 54 mL/min — ABNORMAL LOW (ref >60)
Glucose, Bld: 152 mg/dL — ABNORMAL HIGH (ref 70–99)
Potassium: 4.1 mmol/L (ref 3.5–5.1)
Sodium: 137 mmol/L (ref 135–145)

## 2022-09-28 LAB — CBC
HCT: 25.6 % — ABNORMAL LOW (ref 39.0–52.0)
Hemoglobin: 8 g/dL — ABNORMAL LOW (ref 13.0–17.0)
MCH: 27.6 pg (ref 26.0–34.0)
MCHC: 31.3 g/dL (ref 30.0–36.0)
MCV: 88.3 fL (ref 80.0–100.0)
Platelets: 285 10*3/uL (ref 150–400)
RBC: 2.9 MIL/uL — ABNORMAL LOW (ref 4.22–5.81)
RDW: 14.1 % (ref 11.5–15.5)
WBC: 12.4 10*3/uL — ABNORMAL HIGH (ref 4.0–10.5)
nRBC: 0 % (ref 0.0–0.2)

## 2022-09-28 MED ORDER — POLYETHYLENE GLYCOL 3350 17 G PO PACK
17.0000 g | PACK | Freq: Two times a day (BID) | ORAL | Status: DC
  Administered 2022-09-28 – 2022-10-01 (×7): 17 g via ORAL
  Filled 2022-09-28 (×7): qty 1

## 2022-09-28 MED ORDER — BISACODYL 5 MG PO TBEC: 10.0000 mg | DELAYED_RELEASE_TABLET | Freq: Every day | ORAL | Status: DC | PRN

## 2022-09-28 MED ORDER — SENNOSIDES-DOCUSATE SODIUM 8.6-50 MG PO TABS
1.0000 | ORAL_TABLET | Freq: Two times a day (BID) | ORAL | Status: DC
  Administered 2022-09-28 – 2022-10-01 (×7): 1 via ORAL
  Filled 2022-09-28 (×7): qty 1

## 2022-09-28 NOTE — Progress Notes (Signed)
PROGRESS NOTE   Shane Chambers  FYB:[id number]    DOB: 06/13/1961    DOA: 09/25/2022  PCP: Caprice Renshaw, MD   I have briefly reviewed patients previous medical records in [facility].  Chief Complaint  Patient presents with   Abnormal Lab    Brief Narrative:  61 year old male, nursing home resident for the last 2 years, ambulates some with a walker, PMH of DM, chronic systolic and diastolic CHF, CABG x5, OSA, HTN, COPD, rectal cancer, presented to the [facility] ED on 09/25/2022 with complaints of pain and redness to bilateral legs but significantly worse on the right and noted to have leukocytosis.  History of repeatedly bumping his legs while walking and has multiple healing wounds on his legs.  Hospitalized 6/13 - 6/23 for bilateral lower extremity cellulitis when he was treated with IV vancomycin and ceftriaxone and then switched to Zyvox and discharged on doxycycline.  Course complicated by bacteremia.   Assessment & Plan:  Principal Problem:   Cellulitis of right lower extremity Active Problems:   Severe sepsis (HCC)   Hypertension   Type 2 diabetes mellitus (HCC)   S/P CABG x 5   OSA (obstructive sleep apnea)   Systolic and diastolic CHF, chronic (HCC)   AKI (acute kidney injury) ([place])   Cellulitis of right lower extremity Sepsis ruled out.  Temperature of 100.3 F on ED arrival but no persistent tachypnea, tachycardia.  Normotensive and not hypoxic.  WBC 21.4.  Mildly elevated lactate which nearly normalized.  Likely precipitated by skin breakdown of his legs related to frequently bumping his legs during ambulatory dysfunction.  RLE venous ultrasound negative for DVT.  Bilateral leg compression stockings placed.  Completed IV fluids.  Initiated home dose of torsemide and edema improving.  Initially on IV ceftriaxone and vancomycin for first 48 hours.  Due to positive blood culture on 11/18, gram-positive rods in anaerobic bottle only, after discussing with ID,  antibiotics changed to IV ceftriaxone and doxycycline.  Improving but slowly.  Gram-positive rod bacteremia: Blood cultures returned positive on 11/18.  Discussed with ID MD on call and as per recommendations discontinued vancomycin, continued IV ceftriaxone and added IV doxycycline.  Repeated blood cultures 11/18: Negative to date.  Per ID, this could be a variety (acting/propio/lactobacillus) and treating her for cellulitis and bacteremia. Depending on what grows could transition to PO to treat both.  Sensitivities not back, follow final cultures.   Acute kidney injury Creatinine 1.08 in June 2023.  Presented with creatinine of 2.19.  Creatinine has gradually crept up since June to current.  Hold diuretics and ARB's.  Avoid nephrotoxic.  Completed gentle hydration.  Creatinine unchanged compared to yesterday, 2.19 > 2.17>1.71 >1.48.  Renal ultrasound unremarkable.  Elevated creatinine may be related to diuretics and ARB.  Continue torsemide but hold ARB.  Trend daily BMP   Systolic and diastolic CHF, chronic (HCC) Appears stable and compensated.  Weight per chart appears stable. Last echo 2021, EF of 55 to 60% with grade 2 DD.  Resumed home dose of torsemide.  Strict intake output and daily weights.   OSA (obstructive sleep apnea) CPAP QHS   S/P CABG x 5 Reports chronic dyspnea on exertion over the past several years, unchanged.  EKG unchanged.  Continue statins and aspirin.   Type 2 diabetes mellitus (HCC) Check A1c.  Holding home insulins.  Treating with SSI.  Adjust insulins as needed.  A1c 5.6 indicates good control.   Hypertension Had soft blood  pressures and antihypertensives and diuretics were briefly held.  Continue metoprolol and torsemide.  BP controlled.  Holding losartan.  Anemia of chronic disease:  Baseline hemoglobin probably ranges between 8-10.  Presented with hemoglobin of 9.2 which has dropped but stable in the 8 g range for the last 3 days.  No overt bleeding.   Periodically follow CBCs.  Functional quadriplegia: Unclear etiology.  Has significant wasting of distal muscles of his upper extremity and may be contractures of his fingers/hands.  Outpatient evaluation including neurology consultation if he has not had one.  Patient may return to his prior LTC facility when medically stabilized even on weekends.  Constipation:  Reports straining for stools.  Initiated aggressive bowel regimen.  Body mass index is 37.66 kg/m.  Obesity   DVT prophylaxis: Place TED hose Start: 09/26/22 1024 heparin injection 5,000 Units Start: 09/25/22 2245     Code Status: Full Code:  Family Communication: None at bedside Disposition:  Status is: Inpatient Right lower extremity cellulitis, bacteremia on IV antibiotics, awaiting sensitivities to adjust antibiotics.     Consultants:     Procedures:     Antimicrobials:   IV ceftriaxone and vancomycin.   Subjective:  Asking if he can have a PICC line because he is tired of being stuck for lab draws.  Advised him that currently no indication for PICC line placement and risks of PICC line outweigh benefits at this time.  Verbalized understanding.  Had BM today or yesterday, straining for stools and asking for bowel regimen.  No dyspnea.  No other complaints.  Objective:   Vitals:   09/27/22 1508 09/27/22 1959 09/27/22 2051 09/28/22 0530  BP:   (!) 145/61 (!) 154/76  Pulse:   95 91  Resp:   18   Temp:   99.1 F (37.3 C) 98.6 F (37 C)  TempSrc:   Oral Oral  SpO2: 92% 91% 95% 93%  Weight:      Height:        General exam: Young male, looks older than stated age, moderately built and obese lying almost find in bed without distress. Respiratory system: Okay no rhonchi but better than yesterday and clear to auscultation.  No increased work of breathing. Cardiovascular system: S1 & S2 heard, RRR. No JVD, murmurs, rubs, gallops or clicks.  Trace bilateral leg edema, slightly better than yesterday.  No longer  on telemetry. Gastrointestinal system: Abdomen is nondistended, soft and nontender. No organomegaly or masses felt. Normal bowel sounds heard. Central nervous system: Alert and oriented. No focal neurological deficits. Extremities: Grade 4 x 5 power in upper extremities and may be 3 x 5 power in lower extremities.  Significant distal muscle wasting of upper extremities especially the hands and fingers with may be contractures on the left hand. Skin: Now has bilateral leg compression stockings.  Slowly improving erythema and warmth of right leg but still tender to touch.  Has multiple scabs of healing wounds on bilateral legs.  Also has a small area of redness over the mid shin on the left side but no overt cellulitis. Psychiatry: Judgement and insight appear normal. Mood & affect appropriate.   Below picture taken on admission.   Data Reviewed:   I have personally reviewed following labs and imaging studies   CBC: Recent Labs  Lab 09/25/22 1720 09/26/22 0404 09/27/22 0617 09/28/22 0547  WBC 21.4* 15.2* 12.6* 12.4*  NEUTROABS 18.8*  --   --   --   HGB 9.2* 7.9* 8.0* 8.0*  HCT 29.1* 25.3* 25.5* 25.6*  MCV 89.5 90.0 90.1 88.3  PLT 282 220 264 741    Basic Metabolic Panel: Recent Labs  Lab 09/25/22 1720 09/26/22 0404 09/27/22 0617 09/28/22 0547  NA 135 137 137 137  K 4.7 4.7 4.2 4.1  CL 99 103 103 102  CO2 '25 24 25 25  '$ GLUCOSE 99 103* 134* 152*  BUN 75* 75* 58* 43*  CREATININE 2.19* 2.17* 1.71* 1.48*  CALCIUM 9.4 8.8* 8.6* 8.6*    Liver Function Tests: Recent Labs  Lab 09/25/22 1720  AST 49*  ALT 34  ALKPHOS 90  BILITOT 0.4  PROT 8.0  ALBUMIN 3.3*    CBG: Recent Labs  Lab 09/27/22 1124 09/27/22 1659 09/27/22 2115  GLUCAP 116* 149* 172*    Microbiology Studies:   Recent Results (from the past 240 hour(s))  Blood Culture (routine x 2)     Status: None (Preliminary result)   Collection Time: 09/25/22  5:20 PM   Specimen: Right Antecubital; Blood   Result Value Ref Range Status   Specimen Description   Final    RIGHT ANTECUBITAL Performed at [facility], [address] [place], [place] [address]    Special Requests   Final    BOTTLES DRAWN AEROBIC AND ANAEROBIC Blood Culture results may not be optimal due to an inadequate volume of blood received in culture bottles Performed at [facility], [address], [place], [place] [address]    Culture  Setup Time   Final    WBC PRESENT, PREDOMINANTLY PMN GRAM POSITIVE RODS ANAEROBIC BOTTLE ONLY Gram Stain Report Called to,Read Back By and Verified With: M.LAWSON BY LBASTON 1632, 09/27/22 Performed at [facility], [address] [place], [place] [address]    Culture   Final    CULTURE REINCUBATED FOR BETTER GROWTH Performed at [facility], [place] [address] [place],  [address]    Report Status PENDING  Incomplete  Resp Panel by RT-PCR (Flu A&B, Covid) Anterior Nasal Swab     Status: None   Collection Time: 09/25/22  5:59 PM   Specimen: Anterior Nasal Swab  Result Value Ref Range Status   SARS Coronavirus 2 by RT PCR NEGATIVE NEGATIVE Final    Comment: (NOTE) SARS-CoV-2 target nucleic acids are NOT DETECTED.  The SARS-CoV-2 RNA is generally detectable in upper respiratory specimens during the acute phase of infection. The lowest concentration of SARS-CoV-2 viral copies this assay can detect is 138 copies/mL. A negative result does not preclude SARS-Cov-2 infection and should not be used as the sole basis for treatment or other patient management decisions. A negative result may occur with  improper specimen collection/handling, submission of specimen other than nasopharyngeal swab, presence of viral mutation(s) within the areas targeted by this assay, and inadequate number of viral copies(<138 copies/mL). A negative result must be combined with clinical observations, patient history, and epidemiological information. The expected result is Negative.  Fact Sheet for  Patients:  EntrepreneurPulse.com.au  Fact Sheet for Healthcare Providers:  IncredibleEmployment.be  This test is no t yet approved or cleared by the [place] FDA and  has been authorized for detection and/or diagnosis of SARS-CoV-2 by FDA under an Emergency Use Authorization (EUA). This EUA will remain  in effect (meaning this test can be used) for the duration of the COVID-19 declaration under Section 564(b)(1) of the Act, 21 U.S.C.section 360bbb-3(b)(1), unless the authorization is terminated  or revoked sooner.       Influenza A by  PCR NEGATIVE NEGATIVE Final   Influenza B by PCR NEGATIVE NEGATIVE Final    Comment: (NOTE) The Xpert Xpress SARS-CoV-2/FLU/RSV plus assay is intended as an aid in the diagnosis of influenza from Nasopharyngeal swab specimens and should not be used as a sole basis for treatment. Nasal washings and aspirates are unacceptable for Xpert Xpress SARS-CoV-2/FLU/RSV testing.  Fact Sheet for Patients: EntrepreneurPulse.com.au  Fact Sheet for Healthcare Providers: IncredibleEmployment.be  This test is not yet approved or cleared by the [place] FDA and has been authorized for detection and/or diagnosis of SARS-CoV-2 by FDA under an Emergency Use Authorization (EUA). This EUA will remain in effect (meaning this test can be used) for the duration of the COVID-19 declaration under Section 564(b)(1) of the Act, 21 U.S.C. section 360bbb-3(b)(1), unless the authorization is terminated or revoked.  Performed at [facility], [address] [place], [place] [address]   Blood Culture (routine x 2)     Status: None (Preliminary result)   Collection Time: 09/25/22  6:00 PM   Specimen: Left Antecubital; Blood  Result Value Ref Range Status   Specimen Description LEFT ANTECUBITAL  Final   Special Requests   Final    BOTTLES DRAWN AEROBIC ONLY Blood Culture adequate volume   Culture    Final    NO GROWTH 3 DAYS Performed at [facility], [address], [place], [place] [address]    Report Status PENDING  Incomplete  Culture, blood (Routine X 2) w Reflex to ID Panel     Status: None (Preliminary result)   Collection Time: 09/27/22  5:30 PM   Specimen: Right Antecubital; Blood  Result Value Ref Range Status   Specimen Description   Final    RIGHT ANTECUBITAL BOTTLES DRAWN AEROBIC AND ANAEROBIC   Special Requests Blood Culture adequate volume  Final   Culture   Final    NO GROWTH < 12 HOURS Performed at [facility], [address] [place], [place] [address]    Report Status PENDING  Incomplete  Culture, blood (Routine X 2) w Reflex to ID Panel     Status: None (Preliminary result)   Collection Time: 09/27/22  5:48 PM   Specimen: Left Antecubital; Blood  Result Value Ref Range Status   Specimen Description   Final    LEFT ANTECUBITAL BOTTLES DRAWN AEROBIC AND ANAEROBIC   Special Requests Blood Culture adequate volume  Final   Culture   Final    NO GROWTH < 12 HOURS Performed at [facility], [address], [place], [place] [address]    Report Status PENDING  Incomplete    Radiology Studies:  US RENAL  Result Date: 2022/10/10 CLINICAL DATA:  Acute kidney injury EXAM: RENAL / URINARY TRACT ULTRASOUND COMPLETE COMPARISON:  None available FINDINGS: Right Kidney: Renal measurements: 12.9 x 6.8 x 7.0 cm = volume: 323 mL. Echogenicity within normal limits. No mass or hydronephrosis visualized. Left Kidney: Renal measurements: 12.2 x 6.3 x 6.6 cm = volume: 265 mL. Echogenicity within normal limits. No mass or hydronephrosis visualized. Bladder: Unable to evaluate due to collapse configuration. Other: None. IMPRESSION: No significant sonographic abnormality of the kidneys. Electronically Signed   By: Miachel Roux M.D.   On: 10-10-2022 11:14    Scheduled Meds:    amitriptyline  75 mg Oral QHS   aspirin EC  81 mg Oral Q breakfast   atorvastatin  80 mg Oral QHS    cholecalciferol  2,000 Units Oral Daily   ferrous gluconate  325  mg Oral Daily   gabapentin  300 mg Oral BID   heparin  5,000 Units Subcutaneous Q8H   insulin aspart  0-5 Units Subcutaneous QHS   insulin aspart  0-9 Units Subcutaneous TID WC   levothyroxine  75 mcg Oral Q0600   magnesium oxide  400 mg Oral Daily   metoprolol succinate  12.5 mg Oral BID   mometasone-formoterol  2 puff Inhalation BID   multivitamin with minerals  1 tablet Oral Daily   pantoprazole  40 mg Oral Daily   polyethylene glycol  17 g Oral BID   polyvinyl alcohol  1 drop Both Eyes Daily   senna-docusate  1 tablet Oral BID   tamsulosin  0.8 mg Oral Daily   torsemide  40 mg Oral Daily   venlafaxine XR  225 mg Oral Daily    Continuous Infusions:    cefTRIAXone (ROCEPHIN)  IV 2 g (09/27/22 1830)   doxycycline (VIBRAMYCIN) IV 100 mg (09/28/22 9735)     LOS: 3 days     Vernell Leep, MD,  FACP, [place], [place], [facility], [facility]   [facility] [place]     To contact the attending provider between 7A-7P or the covering provider during after hours 7P-7A, please log into the web site [email or web address] and access using universal [place] password for that web site. If you do not have the password, please call the hospital operator.  09/28/2022, 8:28 AM

## 2022-09-29 DIAGNOSIS — N179 Acute kidney failure, unspecified: Secondary | ICD-10-CM | POA: Diagnosis not present

## 2022-09-29 DIAGNOSIS — L03115 Cellulitis of right lower limb: Secondary | ICD-10-CM | POA: Diagnosis not present

## 2022-09-29 DIAGNOSIS — R7881 Bacteremia: Secondary | ICD-10-CM | POA: Diagnosis not present

## 2022-09-29 LAB — CBC
HCT: 23.3 % — ABNORMAL LOW (ref 39.0–52.0)
HCT: 23.4 % — ABNORMAL LOW (ref 39.0–52.0)
Hemoglobin: 7.4 g/dL — ABNORMAL LOW (ref 13.0–17.0)
Hemoglobin: 7.3 g/dL — ABNORMAL LOW (ref 13.0–17.0)
MCH: 28.4 pg (ref 26.0–34.0)
MCH: 27.5 pg (ref 26.0–34.0)
MCHC: 31.8 g/dL (ref 30.0–36.0)
MCHC: 31.2 g/dL (ref 30.0–36.0)
MCV: 89.3 fL (ref 80.0–100.0)
MCV: 88.3 fL (ref 80.0–100.0)
Platelets: 289 10*3/uL (ref 150–400)
Platelets: 319 10*3/uL (ref 150–400)
RBC: 2.61 MIL/uL — ABNORMAL LOW (ref 4.22–5.81)
RBC: 2.65 MIL/uL — ABNORMAL LOW (ref 4.22–5.81)
RDW: 14.3 % (ref 11.5–15.5)
RDW: 14.3 % (ref 11.5–15.5)
WBC: 12.8 10*3/uL — ABNORMAL HIGH (ref 4.0–10.5)
WBC: 13.1 10*3/uL — ABNORMAL HIGH (ref 4.0–10.5)
nRBC: 0 % (ref 0.0–0.2)
nRBC: 0 % (ref 0.0–0.2)

## 2022-09-29 LAB — BASIC METABOLIC PANEL
Anion gap: 8 (ref 5–15)
BUN: 38 mg/dL — ABNORMAL HIGH (ref 6–20)
CO2: 25 mmol/L (ref 22–32)
Calcium: 8.5 mg/dL — ABNORMAL LOW (ref 8.9–10.3)
Chloride: 106 mmol/L (ref 98–111)
Creatinine, Ser: 1.59 mg/dL — ABNORMAL HIGH (ref 0.61–1.24)
GFR, Estimated: 50 mL/min — ABNORMAL LOW (ref >60)
Glucose, Bld: 122 mg/dL — ABNORMAL HIGH (ref 70–99)
Potassium: 3.8 mmol/L (ref 3.5–5.1)
Sodium: 139 mmol/L (ref 135–145)

## 2022-09-29 LAB — RETICULOCYTES
Immature Retic Fract: 16.5 % — ABNORMAL HIGH (ref 2.3–15.9)
RBC.: 2.6 MIL/uL — ABNORMAL LOW (ref 4.22–5.81)
Retic Count, Absolute: 15.6 10*3/uL — ABNORMAL LOW (ref 19.0–186.0)
Retic Ct Pct: 0.6 % (ref 0.4–3.1)

## 2022-09-29 LAB — CULTURE, BLOOD (ROUTINE X 2)

## 2022-09-29 LAB — IRON AND TIBC
Iron: 11 ug/dL — ABNORMAL LOW (ref 45–182)
Saturation Ratios: 7 % — ABNORMAL LOW (ref 17.9–39.5)
TIBC: 153 ug/dL — ABNORMAL LOW (ref 250–450)
UIBC: 142 ug/dL

## 2022-09-29 LAB — GLUCOSE, CAPILLARY
Glucose-Capillary: 119 mg/dL — ABNORMAL HIGH (ref 70–99)
Glucose-Capillary: 161 mg/dL — ABNORMAL HIGH (ref 70–99)
Glucose-Capillary: 181 mg/dL — ABNORMAL HIGH (ref 70–99)
Glucose-Capillary: 143 mg/dL — ABNORMAL HIGH (ref 70–99)

## 2022-09-29 LAB — HEMOGLOBIN AND HEMATOCRIT, BLOOD
HCT: 23.5 % — ABNORMAL LOW (ref 39.0–52.0)
Hemoglobin: 7.5 g/dL — ABNORMAL LOW (ref 13.0–17.0)

## 2022-09-29 LAB — TYPE AND SCREEN
ABO/RH(D): A POS
Antibody Screen: NEGATIVE

## 2022-09-29 LAB — FOLATE: Folate: 11.3 ng/mL (ref >5.9)

## 2022-09-29 LAB — VITAMIN B12: Vitamin B-12: 934 pg/mL — ABNORMAL HIGH (ref 180–914)

## 2022-09-29 LAB — OCCULT BLOOD X 1 CARD TO LAB, STOOL: Fecal Occult Bld: NEGATIVE

## 2022-09-29 LAB — FERRITIN: Ferritin: 913 ng/mL — ABNORMAL HIGH (ref 24–336)

## 2022-09-29 MED ORDER — DOXYCYCLINE HYCLATE 100 MG PO TABS
100.0000 mg | ORAL_TABLET | Freq: Two times a day (BID) | ORAL | Status: DC
  Administered 2022-09-29 – 2022-10-01 (×4): 100 mg via ORAL
  Filled 2022-09-29 (×4): qty 1

## 2022-09-29 NOTE — Progress Notes (Addendum)
PROGRESS NOTE   Shane Chambers  JXB:[id number]    DOB: [birth year]    DOA: 09/25/2022  PCP: Caprice Renshaw, MD   I have briefly reviewed patients previous medical records in [facility]: [name]  Chief Complaint  Patient presents with   Abnormal Lab    Brief Narrative:  61 year old male, nursing home resident for the last 2 years, ambulates some with a walker, PMH of DM, chronic systolic and diastolic CHF, CABG x5, OSA, HTN, COPD, rectal cancer, presented to the [facility] ED on 09/25/2022 with complaints of pain and redness to bilateral legs but significantly worse on the right and noted to have leukocytosis.  History of repeatedly bumping his legs while walking and has multiple healing wounds on his legs.  Hospitalized 6/13 - 6/23 for bilateral lower extremity cellulitis when he was treated with IV vancomycin and ceftriaxone and then switched to Zyvox and discharged on doxycycline.  Course complicated by bacteremia.   Assessment & Plan:  Principal Problem:   Cellulitis of right lower extremity Active Problems:   Severe sepsis (HCC)   Hypertension   Type 2 diabetes mellitus (HCC)   S/P CABG x 5   OSA (obstructive sleep apnea)   Systolic and diastolic CHF, chronic (HCC)   AKI (acute kidney injury) ([place])   Cellulitis of right lower extremity Sepsis ruled out.  Temperature of 100.3 F on ED arrival but no persistent tachypnea, tachycardia.  Normotensive and not hypoxic.  WBC 21.4.  Mildly elevated lactate which nearly normalized.  Likely precipitated by skin breakdown of his legs related to frequently bumping his legs during ambulatory dysfunction.  RLE venous ultrasound negative for DVT.  Bilateral leg compression stockings placed.  Completed IV fluids.  Initiated home dose of torsemide and edema improving.  Initially on IV ceftriaxone and vancomycin for first 48 hours.  Due to positive blood culture on 11/18, gram-positive rods in anaerobic bottle only, after discussing with ID,  antibiotics changed to IV ceftriaxone and doxycycline.  Has improved compared to admission.  Still with substantial redness of right leg, may take a while to improve.  Discussed with ID MD on-call who recommended transitioning to doxycycline and cefadroxil at discharge.  Gram-positive rod/bacillus bacteremia: Blood cultures returned positive on 11/18.  Discussed with ID MD on call and as per recommendations discontinued vancomycin, continued IV ceftriaxone and added IV doxycycline.  Repeated blood cultures 11/18: Negative to date.  Per ID, this could be a variety (acting/propio/lactobacillus) and treating her for cellulitis and bacteremia. Depending on what grows could transition to PO to treat both.  As per ID, this is likely a contaminant.   Acute kidney injury Creatinine 1.08 in June 2023.  Presented with creatinine of 2.19.  Creatinine has gradually crept up since June to current.  Hold diuretics and ARB's.  Avoid nephrotoxic.  Completed gentle hydration.  Creatinine unchanged compared to yesterday, 2.19 > 2.17>1.71 >1.48.  Renal ultrasound unremarkable.  Elevated creatinine may be related to diuretics and ARB.  Continue torsemide but hold ARB.  Creatinine slightly high at 1.5, follow BMP in AM.   Systolic and diastolic CHF, chronic (HCC) Appears stable and compensated.  Weight per chart appears stable. Last echo 2021, EF of 55 to 60% with grade 2 DD.  Resumed home dose of torsemide.  Strict intake output and daily weights, do not think that either one of them are being accurately charted.   OSA (obstructive sleep apnea) CPAP QHS   S/P CABG x 5 Reports chronic  dyspnea on exertion over the past several years, unchanged.  EKG unchanged.  Continue statins and aspirin.   Type 2 diabetes mellitus (HCC) Check A1c.  Holding home insulins.  Treating with SSI.  Adjust insulins as needed.  A1c 5.6 indicates good control.   Hypertension Had soft blood pressures and antihypertensives and diuretics were  briefly held.  Continue metoprolol and torsemide.  BP controlled.  Holding losartan.  Anemia of chronic disease:  Baseline hemoglobin probably ranges between 8-10.  Presented with hemoglobin of 9.2 which has dropped but stable in the 8 g range for the last 3 days.  Hemoglobin dropped to 7.4 this morning.  Confirmed with nursing that there was no overt bleeding.  Follow-up hemoglobin 6 hours later was 7.3.  Stool per RN was brown in color and sent for FOBT, follow-up.  Discontinued DVT prophylactic dose heparin and placed on SCDs.  Follow CBC at 8 PM and again tomorrow and transfuse for hemoglobin 7 g or less.  May need further evaluation if continues to drop.  Functional quadriplegia: Unclear etiology.  Has significant wasting of distal muscles of his upper extremity and may be contractures of his fingers/hands.  Outpatient evaluation including neurology consultation if he has not had one.  Patient may return to his prior LTC facility when medically stabilized even on weekends.  Constipation:  Reports straining for stools.  Initiated aggressive bowel regimen.  Body mass index is 37.66 kg/m.  Obesity   DVT prophylaxis: Place TED hose Start: 09/26/22 1024 heparin injection 5,000 Units Start: 09/25/22 2245     Code Status: Full Code:  Family Communication: Attempted to reach patient's mother listed on contacts but phone number not working. Disposition:  Status is: Inpatient Plan to discuss with ID, review CBC and BMP and pending confirmation that his mental status is okay, consider DC to SNF later today.     Consultants:     Procedures:     Antimicrobials:   IV ceftriaxone and vancomycin.   Subjective:  Sleepy this morning.  Difficult to arouse.  Mumbles incomprehensibly and drifts back to sleep.  Did receive a dose of IV Dilaudid 0.5 Mg at 4:46 AM.  Objective:   Vitals:   09/28/22 2015 09/28/22 2102 09/29/22 0551 09/29/22 0724  BP:  (!) 156/68 133/64   Pulse:  83 87 82   Resp:  '18 18 18  '$ Temp:  98 F (36.7 C) 98.1 F (36.7 C)   TempSrc:  Oral Oral   SpO2: 95% 97% 95% 98%  Weight:      Height:        General exam: Young male, looks older than stated age, moderately built and obese lying comfortably propped up in bed without distress. Respiratory system: Clear to auscultation.  No increased work of breathing. Cardiovascular system: S1 & S2 heard, RRR. No JVD, murmurs, rubs, gallops or clicks.  No pitting leg edema. Gastrointestinal system: Abdomen is nondistended, soft and nontender. No organomegaly or masses felt. Normal bowel sounds heard. Central nervous system: Mental status is as noted above. No focal neurological deficits. Extremities: Grade 4 x 5 power in upper extremities and may be 3 x 5 power in lower extremities.  Significant distal muscle wasting of upper extremities especially the hands and fingers with may be contractures on the left hand. Skin: Now has bilateral leg compression stockings.  Removed bilateral compression stockings and examined.  Right leg swelling has improved but still swollen asymmetrically compared to the left.  Erythema of right leg  is also improved no more brawny discoloration.  Decreased warmth and tenderness.  Compartments soft.  Left shin area of erythema is also resolved.  Has multiple scabs of healing wounds on bilateral legs.  Psychiatry: Judgement and insight unable to assess today due to mental status as noted above. Mood & affect appropriate.   Below picture taken on admission.   Picture from 09/29/2022  Data Reviewed:   I have personally reviewed following labs and imaging studies   CBC: Recent Labs  Lab 09/25/22 1720 09/26/22 0404 09/27/22 0617 09/28/22 0547  WBC 21.4* 15.2* 12.6* 12.4*  NEUTROABS 18.8*  --   --   --   HGB 9.2* 7.9* 8.0* 8.0*  HCT 29.1* 25.3* 25.5* 25.6*  MCV 89.5 90.0 90.1 88.3  PLT 282 220 264 476    Basic Metabolic Panel: Recent Labs  Lab 09/25/22 1720 09/26/22 0404  09/27/22 0617 09/28/22 0547  NA 135 137 137 137  K 4.7 4.7 4.2 4.1  CL 99 103 103 102  CO2 '25 24 25 25  '$ GLUCOSE 99 103* 134* 152*  BUN 75* 75* 58* 43*  CREATININE 2.19* 2.17* 1.71* 1.48*  CALCIUM 9.4 8.8* 8.6* 8.6*    Liver Function Tests: Recent Labs  Lab 09/25/22 1720  AST 49*  ALT 34  ALKPHOS 90  BILITOT 0.4  PROT 8.0  ALBUMIN 3.3*    CBG: Recent Labs  Lab 09/28/22 1654 09/28/22 2110 09/29/22 0753  GLUCAP 139* 175* 119*    Microbiology Studies:   Recent Results (from the past 240 hour(s))  Blood Culture (routine x 2)     Status: Abnormal   Collection Time: 09/25/22  5:20 PM   Specimen: Right Antecubital; Blood  Result Value Ref Range Status   Specimen Description   Final    RIGHT ANTECUBITAL Performed at [facility], [address], [place], [place] [address]    Special Requests   Final    BOTTLES DRAWN AEROBIC AND ANAEROBIC Blood Culture results may not be optimal due to an inadequate volume of blood received in culture bottles Performed at [facility], [address], [place], [place] [address]    Culture  Setup Time   Final    WBC PRESENT, PREDOMINANTLY PMN GRAM POSITIVE RODS ANAEROBIC BOTTLE ONLY Gram Stain Report Called to,Read Back By and Verified With: [place] [id number], 09/27/22 Performed at [facility], [address], [place], [place] [address]    Culture (A)  Final    BACILLUS SPECIES Standardized susceptibility testing for this organism is not available. Performed at [facility], [place] [address] [place], [place] [address]    Report Status 09/29/2022 FINAL  Final  Resp Panel by RT-PCR (Flu A&B, Covid) Anterior Nasal Swab     Status: None   Collection Time: 09/25/22  5:59 PM   Specimen: Anterior Nasal Swab  Result Value Ref Range Status   SARS Coronavirus 2 by RT PCR NEGATIVE NEGATIVE Final    Comment: (NOTE) SARS-CoV-2 target nucleic acids are NOT DETECTED.  The SARS-CoV-2 RNA is generally detectable in upper  respiratory specimens during the acute phase of infection. The lowest concentration of SARS-CoV-2 viral copies this assay can detect is 138 copies/mL. A negative result does not preclude SARS-Cov-2 infection and should not be used as the sole basis for treatment or other patient management decisions. A negative result may occur with  improper specimen collection/handling, submission of specimen other than nasopharyngeal swab, presence of viral mutation(s) within the areas targeted  by this assay, and inadequate number of viral copies(<138 copies/mL). A negative result must be combined with clinical observations, patient history, and epidemiological information. The expected result is Negative.  Fact Sheet for Patients:  EntrepreneurPulse.com.au  Fact Sheet for Healthcare Providers:  IncredibleEmployment.be  This test is no t yet approved or cleared by the [place] FDA and  has been authorized for detection and/or diagnosis of SARS-CoV-2 by FDA under an Emergency Use Authorization (EUA). This EUA will remain  in effect (meaning this test can be used) for the duration of the COVID-19 declaration under Section 564(b)(1) of the Act, 21 U.S.C.section 360bbb-3(b)(1), unless the authorization is terminated  or revoked sooner.       Influenza A by PCR NEGATIVE NEGATIVE Final   Influenza B by PCR NEGATIVE NEGATIVE Final    Comment: (NOTE) The Xpert Xpress SARS-CoV-2/FLU/RSV plus assay is intended as an aid in the diagnosis of influenza from Nasopharyngeal swab specimens and should not be used as a sole basis for treatment. Nasal washings and aspirates are unacceptable for Xpert Xpress SARS-CoV-2/FLU/RSV testing.  Fact Sheet for Patients: EntrepreneurPulse.com.au  Fact Sheet for Healthcare Providers: IncredibleEmployment.be  This test is not yet approved or cleared by the [place] FDA and has been  authorized for detection and/or diagnosis of SARS-CoV-2 by FDA under an Emergency Use Authorization (EUA). This EUA will remain in effect (meaning this test can be used) for the duration of the COVID-19 declaration under Section 564(b)(1) of the Act, 21 U.S.C. section 360bbb-3(b)(1), unless the authorization is terminated or revoked.  Performed at [facility], [address], [place], [place] [address]   Blood Culture (routine x 2)     Status: None (Preliminary result)   Collection Time: 09/25/22  6:00 PM   Specimen: Left Antecubital; Blood  Result Value Ref Range Status   Specimen Description LEFT ANTECUBITAL  Final   Special Requests   Final    BOTTLES DRAWN AEROBIC ONLY Blood Culture adequate volume   Culture   Final    NO GROWTH 4 DAYS Performed at [facility], [address] [place], [place] [address]    Report Status PENDING  Incomplete  Culture, blood (Routine X 2) w Reflex to ID Panel     Status: None (Preliminary result)   Collection Time: 09/27/22  5:30 PM   Specimen: Right Antecubital; Blood  Result Value Ref Range Status   Specimen Description   Final    RIGHT ANTECUBITAL BOTTLES DRAWN AEROBIC AND ANAEROBIC   Special Requests Blood Culture adequate volume  Final   Culture   Final    NO GROWTH 2 DAYS Performed at [facility], [address], [place], [place] [address]    Report Status PENDING  Incomplete  Culture, blood (Routine X 2) w Reflex to ID Panel     Status: None (Preliminary result)   Collection Time: 09/27/22  5:48 PM   Specimen: Left Antecubital; Blood  Result Value Ref Range Status   Specimen Description   Final    LEFT ANTECUBITAL BOTTLES DRAWN AEROBIC AND ANAEROBIC   Special Requests Blood Culture adequate volume  Final   Culture   Final    NO GROWTH 2 DAYS Performed at [facility], [address] [place], [place] [address]    Report Status PENDING  Incomplete    Radiology Studies:  No results found.  Scheduled Meds:    amitriptyline  75  mg Oral QHS   aspirin EC  81 mg Oral Q breakfast  atorvastatin  80 mg Oral QHS   cholecalciferol  2,000 Units Oral Daily   ferrous gluconate  325 mg Oral Daily   gabapentin  300 mg Oral BID   heparin  5,000 Units Subcutaneous Q8H   insulin aspart  0-5 Units Subcutaneous QHS   insulin aspart  0-9 Units Subcutaneous TID WC   levothyroxine  75 mcg Oral Q0600   magnesium oxide  400 mg Oral Daily   metoprolol succinate  12.5 mg Oral BID   mometasone-formoterol  2 puff Inhalation BID   multivitamin with minerals  1 tablet Oral Daily   pantoprazole  40 mg Oral Daily   polyethylene glycol  17 g Oral BID   polyvinyl alcohol  1 drop Both Eyes Daily   senna-docusate  1 tablet Oral BID   tamsulosin  0.8 mg Oral Daily   torsemide  40 mg Oral Daily   venlafaxine XR  225 mg Oral Daily    Continuous Infusions:    cefTRIAXone (ROCEPHIN)  IV 2 g (09/28/22 1719)   doxycycline (VIBRAMYCIN) IV 100 mg (09/29/22 0625)     LOS: 4 days     Vernell Leep, MD,  FACP, [place], [facility], [facility], [facility]   [facility] [place]     To contact the attending provider between 7A-7P or the covering provider during after hours 7P-7A, please log into the web site [email or web address] and access using universal [place] password for that web site. If you do not have the password, please call the hospital operator.  09/29/2022, 8:15 AM

## 2022-09-30 DIAGNOSIS — D72829 Elevated white blood cell count, unspecified: Secondary | ICD-10-CM

## 2022-09-30 DIAGNOSIS — D649 Anemia, unspecified: Secondary | ICD-10-CM

## 2022-09-30 LAB — BASIC METABOLIC PANEL
Anion gap: 10 (ref 5–15)
BUN: 35 mg/dL — ABNORMAL HIGH (ref 6–20)
CO2: 24 mmol/L (ref 22–32)
Calcium: 8.6 mg/dL — ABNORMAL LOW (ref 8.9–10.3)
Chloride: 104 mmol/L (ref 98–111)
Creatinine, Ser: 1.49 mg/dL — ABNORMAL HIGH (ref 0.61–1.24)
GFR, Estimated: 54 mL/min — ABNORMAL LOW (ref >60)
Glucose, Bld: 140 mg/dL — ABNORMAL HIGH (ref 70–99)
Potassium: 3.7 mmol/L (ref 3.5–5.1)
Sodium: 138 mmol/L (ref 135–145)

## 2022-09-30 LAB — CBC
HCT: 23.8 % — ABNORMAL LOW (ref 39.0–52.0)
Hemoglobin: 7.6 g/dL — ABNORMAL LOW (ref 13.0–17.0)
MCH: 28 pg (ref 26.0–34.0)
MCHC: 31.9 g/dL (ref 30.0–36.0)
MCV: 87.8 fL (ref 80.0–100.0)
Platelets: 356 10*3/uL (ref 150–400)
RBC: 2.71 MIL/uL — ABNORMAL LOW (ref 4.22–5.81)
RDW: 14.6 % (ref 11.5–15.5)
WBC: 16.1 10*3/uL — ABNORMAL HIGH (ref 4.0–10.5)
nRBC: 0 % (ref 0.0–0.2)

## 2022-09-30 LAB — CULTURE, BLOOD (ROUTINE X 2)
Culture: NO GROWTH
Special Requests: ADEQUATE

## 2022-09-30 LAB — GLUCOSE, CAPILLARY
Glucose-Capillary: 117 mg/dL — ABNORMAL HIGH (ref 70–99)
Glucose-Capillary: 183 mg/dL — ABNORMAL HIGH (ref 70–99)
Glucose-Capillary: 147 mg/dL — ABNORMAL HIGH (ref 70–99)
Glucose-Capillary: 245 mg/dL — ABNORMAL HIGH (ref 70–99)

## 2022-09-30 MED ORDER — CEFADROXIL 500 MG PO CAPS
500.0000 mg | ORAL_CAPSULE | Freq: Two times a day (BID) | ORAL | Status: DC
  Administered 2022-09-30 – 2022-10-01 (×3): 500 mg via ORAL
  Filled 2022-09-30 (×7): qty 1

## 2022-09-30 NOTE — Progress Notes (Signed)
Patient has refused CPAP for tonight.

## 2022-09-30 NOTE — Plan of Care (Signed)

## 2022-09-30 NOTE — Progress Notes (Addendum)
PROGRESS NOTE   Shane Chambers  RXV:[id number]    DOB: 1961/10/05    DOA: 09/25/2022  PCP: Caprice Renshaw, MD   I have briefly reviewed patients previous medical records in [facility].  Chief Complaint  Patient presents with   Abnormal Lab    Brief Narrative:  61 year old male, nursing home resident for the last 2 years, ambulates some with a walker, PMH of DM, chronic systolic and diastolic CHF, CABG x5, OSA, HTN, COPD, rectal cancer, presented to the [facility] ED on 09/25/2022 with complaints of pain and redness to bilateral legs but significantly worse on the right and noted to have leukocytosis.  History of repeatedly bumping his legs while walking and has multiple healing wounds on his legs.  Hospitalized 6/13 - 6/23 for bilateral lower extremity cellulitis when he was treated with IV vancomycin and ceftriaxone and then switched to Zyvox and discharged on doxycycline.  Blood culture positive but suspect contaminant.  Clinically improved but concerned today regarding low-grade fever of 100.5 F overnight, worsened leukocytosis, drop in hemoglobin but stable over the last 24 hours in the absence of overt bleeding.  Thereby would like to monitor him closely over the next 24 hours for any decline in his condition as we switch him over to oral antibiotics.  If stable tomorrow, can be discharged to [place] facility.   Assessment & Plan:  Principal Problem:   Cellulitis of right lower extremity Active Problems:   Severe sepsis (HCC)   Hypertension   Type 2 diabetes mellitus (HCC)   S/P CABG x 5   OSA (obstructive sleep apnea)   Systolic and diastolic CHF, chronic (HCC)   AKI (acute kidney injury) ([place])   Cellulitis of right lower extremity Sepsis ruled out.  Temperature of 100.3 F on ED arrival but no persistent tachypnea, tachycardia.  Normotensive and not hypoxic.  WBC 21.4.  Mildly elevated lactate.  Likely precipitated by skin breakdown of his legs related to frequently  bumping his legs during ambulatory dysfunction.  RLE venous ultrasound negative for DVT.  Bilateral leg compression stockings placed.  Completed IV fluids.  Initiated home dose of torsemide and edema improving.  Initially on IV ceftriaxone and vancomycin for first 48 hours.  Due to positive blood culture on 11/18, turned out to be bacillus species/suspected contaminant, after discussing with ID, antibiotics changed to IV ceftriaxone and doxycycline.  Has improved compared to admission.  Still with substantial redness of right leg, may take a while to improve.  Discussed with ID MD on-call 11/20 who recommended transitioning to doxycycline and cefadroxil at discharge.  Clinically improved but concerned today regarding low-grade fever of 100.5 F overnight, worsened leukocytosis.  The right leg itself does not look any worse.  The compression stockings seem too tight especially just under the knee and hence remove them.  Changed IV antibiotics to oral doxycycline and cefadroxil.  Monitor for additional 24 hours and if no high fever, leukocytosis stable or improving, possible DC to LTC tomorrow to complete total 7 to 10 days of antibiotics.  Bacillus species in blood culture/suspected contaminant Blood cultures returned positive on 11/18.  Discussed with ID MD on call and as per recommendations discontinued vancomycin, continued IV ceftriaxone and added IV doxycycline.  Repeated blood cultures 11/18: Negative to date.  As per discussion with ID 11/20, this is likely a contaminant.   Acute kidney injury Creatinine 1.08 in June 2023.  Presented with creatinine of 2.19.  Creatinine has gradually crept up  since June to current.  Hold diuretics and ARB's.  Avoid nephrotoxic.  Completed gentle hydration. Renal ultrasound unremarkable.  Elevated creatinine may be related to diuretics and ARB.  Continue torsemide but hold ARB.  Creatinine has improved and stabilized in the 1.4-1.5 range.  Continue to trend daily BMP.    Systolic and diastolic CHF, chronic (HCC) Appears stable and compensated.  Weight per chart appears stable. Last echo 2021, EF of 55 to 60% with grade 2 DD.  Resumed home dose of torsemide.  -3.3 L thus far, not sure if this is completely accurate.   OSA (obstructive sleep apnea) CPAP QHS   S/P CABG x 5 Reports chronic dyspnea on exertion over the past several years, unchanged.  EKG unchanged.  Continue statins and aspirin.   Type 2 diabetes mellitus ([place]) Holding home insulins.  Treating with SSI.  Adjust insulins as needed.  A1c 5.6 indicates good control.   Hypertension Continue metoprolol and torsemide.  BP controlled.  Holding losartan.  Anemia of chronic disease:  Baseline hemoglobin probably ranges between 8-10.  Presented with hemoglobin of 9.2 which had dropped but was stable in the 8 g range for a couple of days and then dropped abruptly on 11/20 to 7.4 g per DL in the absence of overt bleeding.  Hemoglobin was checked multiple times yesterday and essentially stable, 7.4 > 7.3 > 7.5 > 7.6.  Stool was normal brown in color.  FOBT negative.  Anemia panel reviewed: Iron 11, ferritin up at 913 likely acute phase reactant.  Folate 11.3 and B12: 934.  Discontinued DVT prophylactic dose heparin and placed on SCDs.  Follow CBC in a.m. and transfuse if hemoglobin 7 g or less.  Functional quadriplegia: Unclear etiology.  Has significant wasting of distal muscles of his upper extremity and may be contractures of his fingers/hands.  Outpatient evaluation including neurology consultation if he has not had one.  Patient may return to his prior LTC facility when medically stabilized even on weekends.  Constipation:  Continue bowel regimen.  Appears that he had a large BM on 11/18.?  Also on 11/20  Body mass index is 38.12 kg/m.  Obesity   DVT prophylaxis: Place and maintain sequential compression device Start: 09/29/22 1605 Place TED hose Start: 09/26/22 1024     Code Status: Full Code:   Family Communication: Attempted to reach patient's mother listed on contacts on 11/20 but phone number not working. Disposition:  Please see above.  Pending no fevers, improved or stable WBCs and stable hemoglobin, possible DC to LTC 11/22.     Consultants:     Procedures:     Antimicrobials:   IV ceftriaxone and vancomycin.   Subjective:  No specific complaints reported this morning.  Denies any overt bleeding.  Denies prior blood transfusions.  States that he had colonoscopy last year (noted flex sig on 3/12 that showed a post polypectomy scar).  Objective:   Vitals:   09/30/22 0508 09/30/22 0822 09/30/22 0930 09/30/22 1324  BP: 121/64  (!) 155/74 135/71  Pulse: 75  85 81  Resp: 16     Temp: 98.2 F (36.8 C)  98.5 F (36.9 C) 98.6 F (37 C)  TempSrc: Oral  Oral Oral  SpO2: 92% 94% 92% 97%  Weight: 117.1 kg     Height:        General exam: Young male, looks older than stated age, moderately built and obese lying comfortably propped up in bed without distress. Respiratory system: Clear to  auscultation.  No increased work of breathing. Cardiovascular system: S1 & S2 heard, RRR. No JVD, murmurs, rubs, gallops or clicks.  Nonpitting right greater than left leg edema. Gastrointestinal system: Abdomen is nondistended, soft and nontender. No organomegaly or masses felt. Normal bowel sounds heard. Central nervous system: Alert and oriented x 2. No focal neurological deficits. Extremities: Grade 4 x 5 power in upper extremities and may be 3 x 5 power in lower extremities.  Significant distal muscle wasting of upper extremities especially the hands and fingers with may be contractures on the left hand. Skin: Had compression stockings on both his legs this morning.  The upper part of the right leg stocking is biting into his skin underneath the knee.  Removed both stockings with the assistance of nursing tech.  Right leg warmth and tenderness has definitely improved.  Brawny red  color discoloration with some scaling.  As seen in pictures below from yesterday.  Compartments soft.  Left shin area of erythema is also resolved.  Has multiple scabs of healing wounds on bilateral legs.  Psychiatry: Judgement and insight overall is impaired. Mood & affect appropriate.   Below picture taken on admission.   Picture from 09/29/2022  Data Reviewed:   I have personally reviewed following labs and imaging studies   CBC: Recent Labs  Lab 09/25/22 1720 09/26/22 0404 09/29/22 0835 09/29/22 1330 09/29/22 2016 09/30/22 0358  WBC 21.4*   < > 12.8* 13.1*  --  16.1*  NEUTROABS 18.8*  --   --   --   --   --   HGB 9.2*   < > 7.4* 7.3* 7.5* 7.6*  HCT 29.1*   < > 23.3* 23.4* 23.5* 23.8*  MCV 89.5   < > 89.3 88.3  --  87.8  PLT 282   < > 289 319  --  356   < > = values in this interval not displayed.    Basic Metabolic Panel: Recent Labs  Lab 09/26/22 0404 09/27/22 0617 09/28/22 0547 09/29/22 0835 09/30/22 0358  NA 137 137 137 139 138  K 4.7 4.2 4.1 3.8 3.7  CL 103 103 102 106 104  CO2 '24 25 25 25 24  '$ GLUCOSE 103* 134* 152* 122* 140*  BUN 75* 58* 43* 38* 35*  CREATININE 2.17* 1.71* 1.48* 1.59* 1.49*  CALCIUM 8.8* 8.6* 8.6* 8.5* 8.6*    Liver Function Tests: Recent Labs  Lab 09/25/22 1720  AST 49*  ALT 34  ALKPHOS 90  BILITOT 0.4  PROT 8.0  ALBUMIN 3.3*    CBG: Recent Labs  Lab 09/29/22 2136 09/30/22 0745 09/30/22 1209  GLUCAP 143* 117* 183*    Microbiology Studies:   Recent Results (from the past 240 hour(s))  Blood Culture (routine x 2)     Status: Abnormal   Collection Time: 09/25/22  5:20 PM   Specimen: Right Antecubital; Blood  Result Value Ref Range Status   Specimen Description   Final    RIGHT ANTECUBITAL Performed at [facility], [address], [place], [place] [address]    Special Requests   Final    BOTTLES DRAWN AEROBIC AND ANAEROBIC Blood Culture results may not be optimal due to an inadequate volume of blood received in  culture bottles Performed at [facility], [address] [place], [place] [address]    Culture  Setup Time   Final    WBC PRESENT, PREDOMINANTLY PMN GRAM POSITIVE RODS ANAEROBIC BOTTLE ONLY Gram Stain Report Called to,Read Back By  and Verified With: [place], 09/27/22 Performed at [facility], [address] [place], [place] [address]    Culture (A)  Final    BACILLUS SPECIES Standardized susceptibility testing for this organism is not available. Performed at [facility], [place] [address] [place], [place] [address]    Report Status 09/29/2022 FINAL  Final  Resp Panel by RT-PCR (Flu A&B, Covid) Anterior Nasal Swab     Status: None   Collection Time: 09/25/22  5:59 PM   Specimen: Anterior Nasal Swab  Result Value Ref Range Status   SARS Coronavirus 2 by RT PCR NEGATIVE NEGATIVE Final    Comment: (NOTE) SARS-CoV-2 target nucleic acids are NOT DETECTED.  The SARS-CoV-2 RNA is generally detectable in upper respiratory specimens during the acute phase of infection. The lowest concentration of SARS-CoV-2 viral copies this assay can detect is 138 copies/mL. A negative result does not preclude SARS-Cov-2 infection and should not be used as the sole basis for treatment or other patient management decisions. A negative result may occur with  improper specimen collection/handling, submission of specimen other than nasopharyngeal swab, presence of viral mutation(s) within the areas targeted by this assay, and inadequate number of viral copies(<138 copies/mL). A negative result must be combined with clinical observations, patient history, and epidemiological information. The expected result is Negative.  Fact Sheet for Patients:  EntrepreneurPulse.com.au  Fact Sheet for Healthcare Providers:  IncredibleEmployment.be  This test is no t yet approved or cleared by the [place] FDA and  has been authorized for detection and/or  diagnosis of SARS-CoV-2 by FDA under an Emergency Use Authorization (EUA). This EUA will remain  in effect (meaning this test can be used) for the duration of the COVID-19 declaration under Section 564(b)(1) of the Act, 21 U.S.C.section 360bbb-3(b)(1), unless the authorization is terminated  or revoked sooner.       Influenza A by PCR NEGATIVE NEGATIVE Final   Influenza B by PCR NEGATIVE NEGATIVE Final    Comment: (NOTE) The Xpert Xpress SARS-CoV-2/FLU/RSV plus assay is intended as an aid in the diagnosis of influenza from Nasopharyngeal swab specimens and should not be used as a sole basis for treatment. Nasal washings and aspirates are unacceptable for Xpert Xpress SARS-CoV-2/FLU/RSV testing.  Fact Sheet for Patients: EntrepreneurPulse.com.au  Fact Sheet for Healthcare Providers: IncredibleEmployment.be  This test is not yet approved or cleared by the [place] FDA and has been authorized for detection and/or diagnosis of SARS-CoV-2 by FDA under an Emergency Use Authorization (EUA). This EUA will remain in effect (meaning this test can be used) for the duration of the COVID-19 declaration under Section 564(b)(1) of the Act, 21 U.S.C. section 360bbb-3(b)(1), unless the authorization is terminated or revoked.  Performed at [facility], [address], [place], [place] [address]   Blood Culture (routine x 2)     Status: None   Collection Time: 09/25/22  6:00 PM   Specimen: Left Antecubital; Blood  Result Value Ref Range Status   Specimen Description LEFT ANTECUBITAL  Final   Special Requests   Final    BOTTLES DRAWN AEROBIC ONLY Blood Culture adequate volume   Culture   Final    NO GROWTH 5 DAYS Performed at [facility], [address] [place], [place] [address]    Report Status 09/30/2022 FINAL  Final  Culture, blood (Routine X 2) w Reflex to ID Panel     Status: None (Preliminary result)   Collection Time: 09/27/22  5:30 PM  Specimen: Right Antecubital; Blood  Result Value Ref Range Status   Specimen Description   Final    RIGHT ANTECUBITAL BOTTLES DRAWN AEROBIC AND ANAEROBIC   Special Requests Blood Culture adequate volume  Final   Culture   Final    NO GROWTH 3 DAYS Performed at [facility], [address] [place], [place] [address]    Report Status PENDING  Incomplete  Culture, blood (Routine X 2) w Reflex to ID Panel     Status: None (Preliminary result)   Collection Time: 09/27/22  5:48 PM   Specimen: Left Antecubital; Blood  Result Value Ref Range Status   Specimen Description   Final    LEFT ANTECUBITAL BOTTLES DRAWN AEROBIC AND ANAEROBIC   Special Requests Blood Culture adequate volume  Final   Culture   Final    NO GROWTH 3 DAYS Performed at [facility], [address], [place], [place] [address]    Report Status PENDING  Incomplete    Radiology Studies:  No results found.  Scheduled Meds:    amitriptyline  75 mg Oral QHS   aspirin EC  81 mg Oral Q breakfast   atorvastatin  80 mg Oral QHS   cefadroxil  500 mg Oral BID   cholecalciferol  2,000 Units Oral Daily   doxycycline  100 mg Oral Q12H   ferrous gluconate  325 mg Oral Daily   gabapentin  300 mg Oral BID   insulin aspart  0-5 Units Subcutaneous QHS   insulin aspart  0-9 Units Subcutaneous TID WC   levothyroxine  75 mcg Oral Q0600   magnesium oxide  400 mg Oral Daily   metoprolol succinate  12.5 mg Oral BID   mometasone-formoterol  2 puff Inhalation BID   multivitamin with minerals  1 tablet Oral Daily   pantoprazole  40 mg Oral Daily   polyethylene glycol  17 g Oral BID   polyvinyl alcohol  1 drop Both Eyes Daily   senna-docusate  1 tablet Oral BID   tamsulosin  0.8 mg Oral Daily   torsemide  40 mg Oral Daily   venlafaxine XR  225 mg Oral Daily    Continuous Infusions:       LOS: 5 days     Vernell Leep, MD,  FACP, FHM, SFHM, [facility], [place]     To contact the attending provider between 7A-7P or the covering provider during after hours 7P-7A, please log into the web site [email or web address] and access using universal [place] password for that web site. If you do not have the password, please call the hospital operator.  09/30/2022, 1:31 PM

## 2022-09-30 NOTE — Plan of Care (Signed)
  Problem: Clinical Measurements: Goal: Will remain free from infection Outcome: Not Progressing   Problem: Clinical Measurements: Goal: Diagnostic test results will improve Outcome: Not Progressing   Problem: Pain Managment: Goal: General experience of comfort will improve Outcome: Not Progressing   Problem: Safety: Goal: Ability to remain free from injury will improve Outcome: Not Progressing   Problem: Skin Integrity: Goal: Risk for impaired skin integrity will decrease Outcome: Not Progressing

## 2022-10-01 LAB — BASIC METABOLIC PANEL
Anion gap: 8 (ref 5–15)
BUN: 32 mg/dL — ABNORMAL HIGH (ref 6–20)
CO2: 24 mmol/L (ref 22–32)
Calcium: 8.5 mg/dL — ABNORMAL LOW (ref 8.9–10.3)
Chloride: 105 mmol/L (ref 98–111)
Creatinine, Ser: 1.44 mg/dL — ABNORMAL HIGH (ref 0.61–1.24)
GFR, Estimated: 56 mL/min — ABNORMAL LOW (ref >60)
Glucose, Bld: 124 mg/dL — ABNORMAL HIGH (ref 70–99)
Potassium: 3.3 mmol/L — ABNORMAL LOW (ref 3.5–5.1)
Sodium: 137 mmol/L (ref 135–145)

## 2022-10-01 LAB — CBC
HCT: 23.6 % — ABNORMAL LOW (ref 39.0–52.0)
Hemoglobin: 7.6 g/dL — ABNORMAL LOW (ref 13.0–17.0)
MCH: 28 pg (ref 26.0–34.0)
MCHC: 32.2 g/dL (ref 30.0–36.0)
MCV: 87.1 fL (ref 80.0–100.0)
Platelets: 418 10*3/uL — ABNORMAL HIGH (ref 150–400)
RBC: 2.71 MIL/uL — ABNORMAL LOW (ref 4.22–5.81)
RDW: 14.3 % (ref 11.5–15.5)
WBC: 14.7 10*3/uL — ABNORMAL HIGH (ref 4.0–10.5)
nRBC: 0 % (ref 0.0–0.2)

## 2022-10-01 LAB — GLUCOSE, CAPILLARY: Glucose-Capillary: 125 mg/dL — ABNORMAL HIGH (ref 70–99)

## 2022-10-01 MED ORDER — POTASSIUM CHLORIDE CRYS ER 20 MEQ PO TBCR
40.0000 meq | EXTENDED_RELEASE_TABLET | Freq: Once | ORAL | Status: AC
  Administered 2022-10-01: 40 meq via ORAL
  Filled 2022-10-01: qty 2

## 2022-10-01 MED ORDER — DOXYCYCLINE HYCLATE 100 MG PO TABS: 100.0000 mg | ORAL_TABLET | Freq: Two times a day (BID) | ORAL | 0 refills | Status: DC

## 2022-10-01 MED ORDER — OXYCODONE HCL 5 MG PO TABS: 5.0000 mg | ORAL_TABLET | Freq: Four times a day (QID) | ORAL | 0 refills | Status: DC | PRN

## 2022-10-01 MED ORDER — CEFADROXIL 500 MG PO CAPS: 500.0000 mg | ORAL_CAPSULE | Freq: Two times a day (BID) | ORAL | 0 refills | Status: DC

## 2022-10-01 NOTE — TOC Transition Note (Signed)
Transition of Care [facility]) - CM/SW Discharge Note   Patient Details  Name: Shane Chambers MRN: [id number] Date of Birth: 08/21/1961  Transition of Care [facility]) CM/SW Contact:  Iona Beard, [facility] Phone Number: 10/01/2022, 9:31 AM   Clinical Narrative:    CSW updated Ebony Hail in admissions at [facility] that pt is ready for D/C back to LTC facility. Facility can accept back today. CSW updated RN of room and report numbers. CSW completed med necessity and sent to the floor for RN. CSW attempted to call pts mother on number listed in chart, number has been disconnected. CSW awaiting to call for transport when RN is ready. TOC signing off.   Final next level of care: Long Term Nursing Home Barriers to Discharge: Barriers Resolved   Patient Goals and CMS Choice Patient states their goals for this hospitalization and ongoing recovery are:: return to LTC CMS Medicare.gov Compare Post Acute Care list provided to:: Patient Choice offered to / list presented to : Patient  Discharge Placement              Patient chooses bed at: [facility] Patient to be transferred to facility by: EMS   Patient and family notified of of transfer: 10/01/22  Discharge Plan and Services In-house Referral: Clinical Social Work Discharge Planning Services: CM Consult Post Acute Care Choice: Nursing Home                               Social Determinants of Health (SDOH) Interventions Housing Interventions: Intervention Not Indicated   Readmission Risk Interventions    09/26/2022    1:16 PM 04/23/2022   10:20 AM 11/25/2020   10:15 AM  Readmission Risk Prevention Plan  Transportation Screening Complete Complete Complete  PCP or Specialist Appt within 3-5 Days Complete Complete   HRI or [place] Complete Complete   Social Work Consult for [place] Planning/Counseling Complete Complete   Palliative Care Screening Not Applicable Not Applicable    Medication Review Press photographer) Complete Complete Complete  HRI or Home Care Consult   Complete  SW Recovery Care/Counseling Consult   Complete  Palliative Care Screening   Not Applicable  Skilled Nursing Facility   Complete

## 2022-10-01 NOTE — Discharge Summary (Signed)
Physician Discharge Summary  Shane Chambers GYF:[id number] DOB: Dec 06, 1960 DOA: 09/25/2022  PCP: Caprice Renshaw, MD  Admit date: 09/25/2022 Discharge date: 10/01/2022  Admitted From: Long-term nursing home Disposition: Long-term nursing home  Recommendations for Outpatient Follow-up:  Follow up with PCP in 1-2 weeks Please obtain BMP/CBC in one week Elevate both legs, moisturizer cream, compression stockings  Discharge Condition: Fair CODE STATUS: Full code Diet recommendation: Low-salt, low-carb diet  Discharge summary: 61 year old extensive medical issues from nursing home with history of type 2 diabetes on insulin, chronic heart failure, CABG, obstructive sleep apnea on CPAP, hypertension, COPD brought to the emergency room on 11/16 with pain and redness of the bilateral legs right more than left with previous history of chronic venous stasis changes on the legs.  Patient walks with a walker.  He keeps bumping his legs with difficulty walking.  He was recently hospitalized and treated with broad-spectrum antibiotics.  Patient was admitted to the hospital and treated with broad-spectrum antibiotics, clinically stabilizing, no evidence of underlying abscess or surgical drainage.  He will be discharged back to nursing home today.  He will complete total of 2 weeks of antibiotic therapy with 9 additional days of doxycycline and cefadroxil.   Cellulitis of right lower extremity Sepsis ruled out.  Temperature of 100.3 F on ED arrival but no persistent tachypnea, tachycardia.  Normotensive and not hypoxic.  WBC 21.4.  Mildly elevated lactate.  Likely precipitated by skin breakdown of his legs related to frequently bumping his legs during ambulatory dysfunction.  RLE venous ultrasound negative for DVT.  Bilateral leg compression stockings placed.  Treated with IV ceftriaxone and vancomycin.  Blood cultures with bacillus suspect contaminant.  Transition to doxycycline and cefadroxil on discharge.   He will need diuretics, local wound care, decreasing edema by compression stockings and elevation.    Bacillus species in blood culture/suspected contaminant Blood cultures returned positive on 11/18.  Discussed with ID MD on call and as per recommendations discontinued vancomycin, continued IV ceftriaxone and added IV doxycycline.  Repeated blood cultures 11/18: Negative to date.  As per discussion with ID 11/20, this is likely a contaminant.   Acute kidney injury Creatinine 1.08 in June 2023.  Presented with creatinine of 2.19.  Stabilizing.  Recheck in 1 week.   Systolic and diastolic CHF, chronic (HCC) Appears stable and compensated.  He can go back on torsemide and losartan.   OSA (obstructive sleep apnea) CPAP QHS   S/P CABG x 5 Reports chronic dyspnea on exertion over the past several years, unchanged.  EKG unchanged.  Continue statins and aspirin.   Type 2 diabetes mellitus ([place]) Resume home dose of insulin.   Hypertension Continue metoprolol and torsemide.  BP controlled.  Resume losartan today on discharge.   Anemia of chronic disease:  Baseline hemoglobin probably ranges between 8-10.  Hemoglobin 7.6.  Anemia of chronic disease.  No evidence of active bleeding.  FOBT negative.  Functional quadriplegia: Unclear etiology.  Has significant wasting of distal muscles of his upper extremity and may be contractures of his fingers/hands.  Work with PT OT at the nursing home.    Constipation:  Continue bowel regimen.  Appears that he had a large BM on 11/18.?  Also on 11/20   Body mass index is 38.12 kg/m.  Obesity  Stable for discharge back to nursing home.  Discharge Diagnoses:  Principal Problem:   Cellulitis of right lower extremity Active Problems:   Severe sepsis (HCC)   Hypertension   Type  2 diabetes mellitus (HCC)   S/P CABG x 5   OSA (obstructive sleep apnea)   Systolic and diastolic CHF, chronic (HCC)   AKI (acute kidney injury) [facility])    Discharge  Instructions  Discharge Instructions     Diet - low sodium heart healthy   Complete by: As directed    Diet Carb Modified   Complete by: As directed    Discharge wound care:   Complete by: As directed    Clean , moisturize and keep elevated   Increase activity slowly   Complete by: As directed       Allergies as of 10/01/2022       Reactions   Ace Inhibitors Swelling, Cough   (Not on MAR at [facility] and [place])   Other Itching   [name]   Penicillins    Has patient had a PCN reaction causing immediate rash, facial/tongue/throat swelling, SOB or lightheadedness with hypotension: Unknown Has patient had a PCN reaction causing severe rash involving mucus membranes or skin necrosis: Unknown Has patient had a PCN reaction that required hospitalization: Unknown Has patient had a PCN reaction occurring within the last 10 years: Unknown If all of the above answers are "NO", then may proceed with Cephalosporin use.        Medication List     TAKE these medications    acetaminophen 500 MG tablet Commonly known as: TYLENOL Take 1,000 mg by mouth every 8 (eight) hours as needed for fever or moderate pain.   ACIDOPHILUS PO Take 2 capsules by mouth 2 (two) times daily.   albuterol 108 (90 Base) MCG/ACT inhaler Commonly known as: VENTOLIN HFA Inhale 2 puffs into the lungs every 6 (six) hours as needed for wheezing or shortness of breath.   amitriptyline 75 MG tablet Commonly known as: ELAVIL Take 75 mg by mouth at bedtime.   aspirin EC 81 MG tablet Take 1 tablet (81 mg total) by mouth daily with breakfast.   atorvastatin 80 MG tablet Commonly known as: LIPITOR Take 80 mg by mouth at bedtime.   budesonide-formoterol 160-4.5 MCG/ACT inhaler Commonly known as: Symbicort Inhale 2 puffs into the lungs in the morning and at bedtime.   cefadroxil 500 MG capsule Commonly known as: DURICEF Take 1 capsule (500 mg total) by mouth 2 (two) times daily  for 9 days.   docusate sodium 100 MG capsule Commonly known as: COLACE Take 100 mg by mouth daily.   doxycycline 100 MG tablet Commonly known as: VIBRA-TABS Take 1 tablet (100 mg total) by mouth 2 (two) times daily for 9 days.   FERROUS GLUCONATE PO Take 325 mg by mouth daily.   gabapentin 300 MG capsule Commonly known as: NEURONTIN Take 2 capsules (600 mg total) by mouth 2 (two) times daily.   insulin aspart protamine- aspart (70-30) 100 UNIT/ML injection Commonly known as: NOVOLOG MIX 70/30 Inject 0.08 mLs (8 Units total) into the skin 2 (two) times daily with a meal.   levothyroxine 75 MCG tablet Commonly known as: SYNTHROID Take 75 mcg by mouth daily before breakfast.   loperamide HCl 2 MG/15ML solution Commonly known as: IMODIUM Take 4 mg by mouth every 6 (six) hours as needed for diarrhea or loose stools.   losartan 50 MG tablet Commonly known as: COZAAR Take 50 mg by mouth daily.   magnesium oxide 400 MG tablet Commonly known as: MAG-OX Take 400 mg by mouth daily.   metFORMIN 1000 MG tablet Commonly known as:  GLUCOPHAGE Take 1,000 mg by mouth 2 (two) times daily.   metoprolol succinate 25 MG 24 hr tablet Commonly known as: TOPROL-XL Take 0.5 tablets (12.5 mg total) by mouth 2 (two) times daily.   multivitamin tablet Take 1 tablet by mouth daily.   omeprazole 40 MG capsule Commonly known as: PRILOSEC Take 40 mg by mouth daily.   oxyCODONE 5 MG immediate release tablet Commonly known as: Oxy IR/ROXICODONE Take 1 tablet (5 mg total) by mouth every 6 (six) hours as needed for up to 3 days for moderate pain or severe pain.   polyethylene glycol 17 g packet Commonly known as: MIRALAX / GLYCOLAX Take 17 g by mouth daily as needed for moderate constipation.   polyvinyl alcohol 1.4 % ophthalmic solution Commonly known as: LIQUIFILM TEARS Place 1 drop into both eyes daily.   potassium chloride SA 20 MEQ tablet Commonly known as: KLOR-CON M Take 1 tablet  (20 mEq total) by mouth daily.   PRO-STAT AWC PO Take 30 mLs by mouth 2 (two) times daily.   tamsulosin 0.4 MG Caps capsule Commonly known as: FLOMAX Take 0.8 mg by mouth daily.   torsemide 20 MG tablet Commonly known as: DEMADEX Take 3 tablets (60 mg total) by mouth daily.   venlafaxine XR 75 MG 24 hr capsule Commonly known as: EFFEXOR-XR Take 225 mg by mouth daily.   Vitamin D 50 MCG (2000 UT) tablet Take 2,000 Units by mouth daily.               Discharge Care Instructions  (From admission, onward)           Start     Ordered   10/01/22 0000  Discharge wound care:       Comments: Clean , moisturize and keep elevated   10/01/22 0825            Allergies  Allergen Reactions   Ace Inhibitors Swelling and Cough    (Not on MAR at [facility] and [place])   Other Itching    [name]   Penicillins     Has patient had a PCN reaction causing immediate rash, facial/tongue/throat swelling, SOB or lightheadedness with hypotension: Unknown Has patient had a PCN reaction causing severe rash involving mucus membranes or skin necrosis: Unknown Has patient had a PCN reaction that required hospitalization: Unknown Has patient had a PCN reaction occurring within the last 10 years: Unknown If all of the above answers are "NO", then may proceed with Cephalosporin use.     Consultations: Infectious disease, phone consultation   Procedures/Studies: US RENAL  Result Date: 09/26/2022 CLINICAL DATA:  Acute kidney injury EXAM: RENAL / URINARY TRACT ULTRASOUND COMPLETE COMPARISON:  Shane Chambers available FINDINGS: Right Kidney: Renal measurements: 12.9 x 6.8 x 7.0 cm = volume: 323 mL. Echogenicity within normal limits. No mass or hydronephrosis visualized. Left Kidney: Renal measurements: 12.2 x 6.3 x 6.6 cm = volume: 265 mL. Echogenicity within normal limits. No mass or hydronephrosis visualized. Bladder: Unable to evaluate due to collapse configuration.  Other: Shane Chambers. IMPRESSION: No significant sonographic abnormality of the kidneys. Electronically Signed   By: Miachel Roux M.D.   On: 09/26/2022 11:14   US Venous Img Lower Unilateral Right (DVT)  Result Date: 09/26/2022 CLINICAL DATA:  Leg swelling EXAM: RIGHT LOWER EXTREMITY VENOUS DOPPLER ULTRASOUND TECHNIQUE: Gray-scale sonography with compression, as well as color and duplex ultrasound, were performed to evaluate the deep venous system(s) from the level of the common femoral  vein through the popliteal and proximal calf veins. COMPARISON:  04/25/2022 FINDINGS: VENOUS Normal compressibility of the common femoral, superficial femoral, and popliteal veins, as well as the visualized calf veins. Visualized portions of profunda femoral vein and great saphenous vein unremarkable. No filling defects to suggest DVT on grayscale or color Doppler imaging. Doppler waveforms show normal direction of venous flow, normal respiratory plasticity and response to augmentation. Limited views of the contralateral common femoral vein are unremarkable. OTHER Shane Chambers. Limitations: Shane Chambers IMPRESSION: No right lower extremity DVT Electronically Signed   By: Miachel Roux M.D.   On: 09/26/2022 08:47   DG Chest Port 1 View  Result Date: 09/25/2022 CLINICAL DATA:  Sepsis, elevated white blood cell count, lower extremity erythema and swelling EXAM: PORTABLE CHEST 1 VIEW COMPARISON:  04/22/2022 FINDINGS: Single frontal view of the chest was obtained in lordotic positioning. Cardiac silhouette is unremarkable. Postsurgical changes from median sternotomy. No airspace disease, effusion, or pneumothorax. No acute bony abnormalities. IMPRESSION: 1. No acute intrathoracic process. Electronically Signed   By: Randa Ngo M.D.   On: 09/25/2022 17:26   (Echo, Carotid, EGD, Colonoscopy, ERCP)    Subjective: Patient seen and examined.  Denies any complaints.  Afebrile last 24 hours.  Blood pressure stable.  He tells me that his legs are little  bit more swollen than usual.   Discharge Exam: Vitals:   10/01/22 0513 10/01/22 0823  BP: (!) 148/67   Pulse: 74   Resp: 20   Temp: (!) 97.5 F (36.4 C)   SpO2: 91% 93%   Vitals:   09/30/22 2034 09/30/22 2130 10/01/22 0513 10/01/22 0823  BP:  (!) 156/68 (!) 148/67   Pulse:  91 74   Resp:  20 20   Temp:  99.2 F (37.3 C) (!) 97.5 F (36.4 C)   TempSrc:   Oral   SpO2: 92% 94% 91% 93%  Weight:   118 kg   Height:        General: Pt is alert, awake, not in acute distress Chronically sick looking.  All extremities are weak. Cardiovascular: RRR, S1/S2 +, no rubs, no gallops Respiratory: CTA bilaterally, no wheezing, no rhonchi Abdominal: Soft, NT, ND, bowel sounds + Extremities:   Receding erythema of the right leg as compared to admission.  He does have significant Shane Chambers pedal edema, no fluctuation.       The results of significant diagnostics from this hospitalization (including imaging, microbiology, ancillary and laboratory) are listed below for reference.     Microbiology: Recent Results (from the past 240 hour(s))  Blood Culture (routine x 2)     Status: Abnormal   Collection Time: 09/25/22  5:20 PM   Specimen: Right Antecubital; Blood  Result Value Ref Range Status   Specimen Description   Final    RIGHT ANTECUBITAL Performed at [facility], [address], [place], [place] [address]    Special Requests   Final    BOTTLES DRAWN AEROBIC AND ANAEROBIC Blood Culture results may not be optimal due to an inadequate volume of blood received in culture bottles Performed at [facility], [address] [place], [place] [address]    Culture  Setup Time   Final    WBC PRESENT, PREDOMINANTLY PMN GRAM POSITIVE RODS ANAEROBIC BOTTLE ONLY Gram Stain Report Called to,Read Back By and Verified With: M.LAWSON BY LBASTON 1632, 09/27/22 Performed at [facility], [address] [place], [place] [address]    Culture (A)  Final    BACILLUS SPECIES  Standardized  susceptibility testing for this organism is not available. Performed at [facility], [place] [address], [place], [place] [address]    Report Status 09/29/2022 FINAL  Final  Resp Panel by RT-PCR (Flu A&B, Covid) Anterior Nasal Swab     Status: Shane Chambers   Collection Time: 09/25/22  5:59 PM   Specimen: Anterior Nasal Swab  Result Value Ref Range Status   SARS Coronavirus 2 by RT PCR NEGATIVE NEGATIVE Final    Comment: (NOTE) SARS-CoV-2 target nucleic acids are NOT DETECTED.  The SARS-CoV-2 RNA is generally detectable in upper respiratory specimens during the acute phase of infection. The lowest concentration of SARS-CoV-2 viral copies this assay can detect is 138 copies/mL. A negative result does not preclude SARS-Cov-2 infection and should not be used as the sole basis for treatment or other patient management decisions. A negative result may occur with  improper specimen collection/handling, submission of specimen other than nasopharyngeal swab, presence of viral mutation(s) within the areas targeted by this assay, and inadequate number of viral copies(<138 copies/mL). A negative result must be combined with clinical observations, patient history, and epidemiological information. The expected result is Negative.  Fact Sheet for Patients:  EntrepreneurPulse.com.au  Fact Sheet for Healthcare Providers:  IncredibleEmployment.be  This test is no t yet approved or cleared by the [place] FDA and  has been authorized for detection and/or diagnosis of SARS-CoV-2 by FDA under an Emergency Use Authorization (EUA). This EUA will remain  in effect (meaning this test can be used) for the duration of the COVID-19 declaration under Section 564(b)(1) of the Act, 21 U.S.C.section 360bbb-3(b)(1), unless the authorization is terminated  or revoked sooner.       Influenza A by PCR NEGATIVE NEGATIVE Final   Influenza B by PCR NEGATIVE NEGATIVE Final     Comment: (NOTE) The Xpert Xpress SARS-CoV-2/FLU/RSV plus assay is intended as an aid in the diagnosis of influenza from Nasopharyngeal swab specimens and should not be used as a sole basis for treatment. Nasal washings and aspirates are unacceptable for Xpert Xpress SARS-CoV-2/FLU/RSV testing.  Fact Sheet for Patients: EntrepreneurPulse.com.au  Fact Sheet for Healthcare Providers: IncredibleEmployment.be  This test is not yet approved or cleared by the [place] FDA and has been authorized for detection and/or diagnosis of SARS-CoV-2 by FDA under an Emergency Use Authorization (EUA). This EUA will remain in effect (meaning this test can be used) for the duration of the COVID-19 declaration under Section 564(b)(1) of the Act, 21 U.S.C. section 360bbb-3(b)(1), unless the authorization is terminated or revoked.  Performed at [facility], [address] [place], [place] [address]   Blood Culture (routine x 2)     Status: Shane Chambers   Collection Time: 09/25/22  6:00 PM   Specimen: Left Antecubital; Blood  Result Value Ref Range Status   Specimen Description LEFT ANTECUBITAL  Final   Special Requests   Final    BOTTLES DRAWN AEROBIC ONLY Blood Culture adequate volume   Culture   Final    NO GROWTH 5 DAYS Performed at [facility], [address], [place],  [address]    Report Status 09/30/2022 FINAL  Final  Culture, blood (Routine X 2) w Reflex to ID Panel     Status: Shane Chambers (Preliminary result)   Collection Time: 09/27/22  5:30 PM   Specimen: Right Antecubital; Blood  Result Value Ref Range Status   Specimen Description   Final    RIGHT ANTECUBITAL BOTTLES DRAWN AEROBIC AND ANAEROBIC  Special Requests Blood Culture adequate volume  Final   Culture   Final    NO GROWTH 3 DAYS Performed at [facility], [address], [place], [place] [address]    Report Status PENDING  Incomplete  Culture, blood (Routine X 2) w Reflex to ID Panel      Status: Shane Chambers (Preliminary result)   Collection Time: 09/27/22  5:48 PM   Specimen: Left Antecubital; Blood  Result Value Ref Range Status   Specimen Description   Final    LEFT ANTECUBITAL BOTTLES DRAWN AEROBIC AND ANAEROBIC   Special Requests Blood Culture adequate volume  Final   Culture   Final    NO GROWTH 3 DAYS Performed at [facility], [address], [place], [place] [address]    Report Status PENDING  Incomplete     Labs: BNP (last 3 results) No results for input(s): "BNP" in the last 8760 hours. Basic Metabolic Panel: Recent Labs  Lab 09/27/22 0617 09/28/22 0547 09/29/22 0835 09/30/22 0358 10/01/22 0411  NA 137 137 139 138 137  K 4.2 4.1 3.8 3.7 3.3*  CL 103 102 106 104 105  CO2 '25 25 25 24 24  '$ GLUCOSE 134* 152* 122* 140* 124*  BUN 58* 43* 38* 35* 32*  CREATININE 1.71* 1.48* 1.59* 1.49* 1.44*  CALCIUM 8.6* 8.6* 8.5* 8.6* 8.5*   Liver Function Tests: Recent Labs  Lab 09/25/22 1720  AST 49*  ALT 34  ALKPHOS 90  BILITOT 0.4  PROT 8.0  ALBUMIN 3.3*   No results for input(s): "LIPASE", "AMYLASE" in the last 168 hours. No results for input(s): "AMMONIA" in the last 168 hours. CBC: Recent Labs  Lab 09/25/22 1720 09/26/22 0404 09/28/22 0547 09/29/22 0835 09/29/22 1330 09/29/22 2016 09/30/22 0358 10/01/22 0411  WBC 21.4*   < > 12.4* 12.8* 13.1*  --  16.1* 14.7*  NEUTROABS 18.8*  --   --   --   --   --   --   --   HGB 9.2*   < > 8.0* 7.4* 7.3* 7.5* 7.6* 7.6*  HCT 29.1*   < > 25.6* 23.3* 23.4* 23.5* 23.8* 23.6*  MCV 89.5   < > 88.3 89.3 88.3  --  87.8 87.1  PLT 282   < > 285 289 319  --  356 418*   < > = values in this interval not displayed.   Cardiac Enzymes: No results for input(s): "CKTOTAL", "CKMB", "CKMBINDEX", "TROPONINI" in the last 168 hours. BNP: Invalid input(s): "POCBNP" CBG: Recent Labs  Lab 09/30/22 0745 09/30/22 1209 09/30/22 1704 09/30/22 2140 10/01/22 0756  GLUCAP 117* 183* 147* 245* 125*   D-Dimer No results for  input(s): "DDIMER" in the last 72 hours. Hgb A1c No results for input(s): "HGBA1C" in the last 72 hours. Lipid Profile No results for input(s): "CHOL", "HDL", "LDLCALC", "TRIG", "CHOLHDL", "LDLDIRECT" in the last 72 hours. Thyroid function studies No results for input(s): "TSH", "T4TOTAL", "T3FREE", "THYROIDAB" in the last 72 hours.  Invalid input(s): "FREET3" Anemia work up Recent Labs    09/29/22 1330  VITAMINB12 934*  FOLATE 11.3  FERRITIN 913*  TIBC 153*  IRON 11*  RETICCTPCT 0.6   Urinalysis    Component Value Date/Time   COLORURINE YELLOW 09/26/2022 0530   APPEARANCEUR CLEAR 09/26/2022 0530   LABSPEC 1.013 09/26/2022 0530   PHURINE 5.0 09/26/2022 0530   GLUCOSEU NEGATIVE 09/26/2022 0530   HGBUR NEGATIVE 09/26/2022 0530   BILIRUBINUR NEGATIVE 09/26/2022 0530   KETONESUR NEGATIVE 09/26/2022  0530   PROTEINUR 100 (A) 09/26/2022 0530   NITRITE NEGATIVE 09/26/2022 0530   LEUKOCYTESUR NEGATIVE 09/26/2022 0530   Sepsis Labs Recent Labs  Lab 09/29/22 0835 09/29/22 1330 09/30/22 0358 10/01/22 0411  WBC 12.8* 13.1* 16.1* 14.7*   Microbiology Recent Results (from the past 240 hour(s))  Blood Culture (routine x 2)     Status: Abnormal   Collection Time: 09/25/22  5:20 PM   Specimen: Right Antecubital; Blood  Result Value Ref Range Status   Specimen Description   Final    RIGHT ANTECUBITAL Performed at [facility], [address], [place], [place] [address]    Special Requests   Final    BOTTLES DRAWN AEROBIC AND ANAEROBIC Blood Culture results may not be optimal due to an inadequate volume of blood received in culture bottles Performed at [facility], [address] [place], [place] [address]    Culture  Setup Time   Final    WBC PRESENT, PREDOMINANTLY PMN GRAM POSITIVE RODS ANAEROBIC BOTTLE ONLY Gram Stain Report Called to,Read Back By and Verified With: M.LAWSON BY LBASTON 1632, 09/27/22 Performed at [facility], [address] [place], [place] [address]     Culture (A)  Final    BACILLUS SPECIES Standardized susceptibility testing for this organism is not available. Performed at [facility], [place] [address], [place],  [address]    Report Status 09/29/2022 FINAL  Final  Resp Panel by RT-PCR (Flu A&B, Covid) Anterior Nasal Swab     Status: Shane Chambers   Collection Time: 09/25/22  5:59 PM   Specimen: Anterior Nasal Swab  Result Value Ref Range Status   SARS Coronavirus 2 by RT PCR NEGATIVE NEGATIVE Final    Comment: (NOTE) SARS-CoV-2 target nucleic acids are NOT DETECTED.  The SARS-CoV-2 RNA is generally detectable in upper respiratory specimens during the acute phase of infection. The lowest concentration of SARS-CoV-2 viral copies this assay can detect is 138 copies/mL. A negative result does not preclude SARS-Cov-2 infection and should not be used as the sole basis for treatment or other patient management decisions. A negative result may occur with  improper specimen collection/handling, submission of specimen other than nasopharyngeal swab, presence of viral mutation(s) within the areas targeted by this assay, and inadequate number of viral copies(<138 copies/mL). A negative result must be combined with clinical observations, patient history, and epidemiological information. The expected result is Negative.  Fact Sheet for Patients:  EntrepreneurPulse.com.au  Fact Sheet for Healthcare Providers:  IncredibleEmployment.be  This test is no t yet approved or cleared by the [place] FDA and  has been authorized for detection and/or diagnosis of SARS-CoV-2 by FDA under an Emergency Use Authorization (EUA). This EUA will remain  in effect (meaning this test can be used) for the duration of the COVID-19 declaration under Section 564(b)(1) of the Act, 21 U.S.C.section 360bbb-3(b)(1), unless the authorization is terminated  or revoked sooner.       Influenza A by PCR NEGATIVE NEGATIVE  Final   Influenza B by PCR NEGATIVE NEGATIVE Final    Comment: (NOTE) The Xpert Xpress SARS-CoV-2/FLU/RSV plus assay is intended as an aid in the diagnosis of influenza from Nasopharyngeal swab specimens and should not be used as a sole basis for treatment. Nasal washings and aspirates are unacceptable for Xpert Xpress SARS-CoV-2/FLU/RSV testing.  Fact Sheet for Patients: EntrepreneurPulse.com.au  Fact Sheet for Healthcare Providers: IncredibleEmployment.be  This test is not yet approved or cleared by the [place] FDA and has  been authorized for detection and/or diagnosis of SARS-CoV-2 by FDA under an Emergency Use Authorization (EUA). This EUA will remain in effect (meaning this test can be used) for the duration of the COVID-19 declaration under Section 564(b)(1) of the Act, 21 U.S.C. section 360bbb-3(b)(1), unless the authorization is terminated or revoked.  Performed at [facility], [address], [place], [place] [address]   Blood Culture (routine x 2)     Status: Shane Chambers   Collection Time: 09/25/22  6:00 PM   Specimen: Left Antecubital; Blood  Result Value Ref Range Status   Specimen Description LEFT ANTECUBITAL  Final   Special Requests   Final    BOTTLES DRAWN AEROBIC ONLY Blood Culture adequate volume   Culture   Final    NO GROWTH 5 DAYS Performed at [facility], [address] [place], [place] [address]    Report Status 09/30/2022 FINAL  Final  Culture, blood (Routine X 2) w Reflex to ID Panel     Status: Shane Chambers (Preliminary result)   Collection Time: 09/27/22  5:30 PM   Specimen: Right Antecubital; Blood  Result Value Ref Range Status   Specimen Description   Final    RIGHT ANTECUBITAL BOTTLES DRAWN AEROBIC AND ANAEROBIC   Special Requests Blood Culture adequate volume  Final   Culture   Final    NO GROWTH 3 DAYS Performed at [facility], [address] [place], [place] [address]    Report Status PENDING  Incomplete   Culture, blood (Routine X 2) w Reflex to ID Panel     Status: Shane Chambers (Preliminary result)   Collection Time: 09/27/22  5:48 PM   Specimen: Left Antecubital; Blood  Result Value Ref Range Status   Specimen Description   Final    LEFT ANTECUBITAL BOTTLES DRAWN AEROBIC AND ANAEROBIC   Special Requests Blood Culture adequate volume  Final   Culture   Final    NO GROWTH 3 DAYS Performed at [facility], [address] [place],  [address]    Report Status PENDING  Incomplete     Time coordinating discharge: 35 minutes  SIGNED:   Barb Merino, MD  [facility] 10/01/2022, 8:25 AM

## 2022-10-01 NOTE — Progress Notes (Signed)
Patient discharged to [facility], report called and given to Rhae Hammock LPN. EMS to transport patient to awaiting facility. IV discontinued,catheter intact. Today's Vitals   09/30/22 2255 10/01/22 0513 10/01/22 0823 10/01/22 0901  BP:  (!) 148/67  (!) 157/79  Pulse:  74  98  Resp:  20    Temp:  (!) 97.5 F (36.4 C)  98.3 F (36.8 C)  TempSrc:  Oral  Oral  SpO2:  91% 93%   Weight:  118 kg    Height:      PainSc: Asleep      Body mass index is 38.42 kg/m.

## 2022-10-01 NOTE — Care Management Important Message (Signed)
Important Message  Patient Details  Name: Shane Chambers MRN: [id number] Date of Birth: July 16, 1961   Medicare Important Message Given:  Yes     Tommy Medal 10/01/2022, 10:25 AM

## 2022-10-02 ENCOUNTER — Emergency Department (HOSPITAL_COMMUNITY): Payer: Medicare Other

## 2022-10-02 ENCOUNTER — Inpatient Hospital Stay (HOSPITAL_COMMUNITY)
Admission: EM | Admit: 2022-10-02 | Discharge: 2022-10-17 | DRG: 573 | Disposition: A | Discharge status: Skilled Nursing Facility | Payer: Medicare Other | Source: Skilled Nursing Facility | Attending: Internal Medicine | Admitting: Internal Medicine

## 2022-10-02 DIAGNOSIS — E039 Hypothyroidism, unspecified: Secondary | ICD-10-CM | POA: Diagnosis present

## 2022-10-02 DIAGNOSIS — R4182 Altered mental status, unspecified: Secondary | ICD-10-CM | POA: Diagnosis not present

## 2022-10-02 DIAGNOSIS — Z6838 Body mass index (BMI) 38.0-38.9, adult: Secondary | ICD-10-CM

## 2022-10-02 DIAGNOSIS — E1122 Type 2 diabetes mellitus with diabetic chronic kidney disease: Secondary | ICD-10-CM | POA: Diagnosis not present

## 2022-10-02 DIAGNOSIS — R532 Functional quadriplegia: Secondary | ICD-10-CM | POA: Diagnosis not present

## 2022-10-02 DIAGNOSIS — M009 Pyogenic arthritis, unspecified: Secondary | ICD-10-CM | POA: Diagnosis not present

## 2022-10-02 DIAGNOSIS — E870 Hyperosmolality and hypernatremia: Secondary | ICD-10-CM | POA: Diagnosis not present

## 2022-10-02 DIAGNOSIS — E872 Acidosis, unspecified: Secondary | ICD-10-CM | POA: Diagnosis not present

## 2022-10-02 DIAGNOSIS — E1159 Type 2 diabetes mellitus with other circulatory complications: Secondary | ICD-10-CM | POA: Diagnosis not present

## 2022-10-02 DIAGNOSIS — E46 Unspecified protein-calorie malnutrition: Secondary | ICD-10-CM | POA: Diagnosis present

## 2022-10-02 DIAGNOSIS — F13921 Sedative, hypnotic or anxiolytic use, unspecified with intoxication delirium: Secondary | ICD-10-CM | POA: Diagnosis not present

## 2022-10-02 DIAGNOSIS — Z794 Long term (current) use of insulin: Secondary | ICD-10-CM

## 2022-10-02 DIAGNOSIS — N400 Enlarged prostate without lower urinary tract symptoms: Secondary | ICD-10-CM | POA: Diagnosis present

## 2022-10-02 DIAGNOSIS — F05 Delirium due to known physiological condition: Secondary | ICD-10-CM | POA: Diagnosis not present

## 2022-10-02 DIAGNOSIS — I472 Ventricular tachycardia, unspecified: Secondary | ICD-10-CM | POA: Diagnosis not present

## 2022-10-02 DIAGNOSIS — K30 Functional dyspepsia: Secondary | ICD-10-CM | POA: Diagnosis not present

## 2022-10-02 DIAGNOSIS — E1169 Type 2 diabetes mellitus with other specified complication: Secondary | ICD-10-CM | POA: Diagnosis not present

## 2022-10-02 DIAGNOSIS — Z79899 Other long term (current) drug therapy: Secondary | ICD-10-CM

## 2022-10-02 DIAGNOSIS — Z7984 Long term (current) use of oral hypoglycemic drugs: Secondary | ICD-10-CM | POA: Diagnosis not present

## 2022-10-02 DIAGNOSIS — A419 Sepsis, unspecified organism: Secondary | ICD-10-CM | POA: Diagnosis not present

## 2022-10-02 DIAGNOSIS — F331 Major depressive disorder, recurrent, moderate: Secondary | ICD-10-CM | POA: Diagnosis not present

## 2022-10-02 DIAGNOSIS — F0153 Vascular dementia, unspecified severity, with mood disturbance: Secondary | ICD-10-CM | POA: Diagnosis present

## 2022-10-02 DIAGNOSIS — L89301 Pressure ulcer of unspecified buttock, stage 1: Secondary | ICD-10-CM | POA: Diagnosis present

## 2022-10-02 DIAGNOSIS — M60070 Infective myositis, right ankle: Secondary | ICD-10-CM | POA: Diagnosis present

## 2022-10-02 DIAGNOSIS — Z7982 Long term (current) use of aspirin: Secondary | ICD-10-CM

## 2022-10-02 DIAGNOSIS — Z888 Allergy status to other drugs, medicaments and biological substances status: Secondary | ICD-10-CM

## 2022-10-02 DIAGNOSIS — K219 Gastro-esophageal reflux disease without esophagitis: Secondary | ICD-10-CM | POA: Diagnosis not present

## 2022-10-02 DIAGNOSIS — L0390 Cellulitis, unspecified: Secondary | ICD-10-CM | POA: Diagnosis present

## 2022-10-02 DIAGNOSIS — Z85048 Personal history of other malignant neoplasm of rectum, rectosigmoid junction, and anus: Secondary | ICD-10-CM

## 2022-10-02 DIAGNOSIS — F32A Depression, unspecified: Secondary | ICD-10-CM | POA: Diagnosis present

## 2022-10-02 DIAGNOSIS — E782 Mixed hyperlipidemia: Secondary | ICD-10-CM | POA: Diagnosis not present

## 2022-10-02 DIAGNOSIS — L03115 Cellulitis of right lower limb: Principal | ICD-10-CM

## 2022-10-02 DIAGNOSIS — G9341 Metabolic encephalopathy: Secondary | ICD-10-CM | POA: Diagnosis not present

## 2022-10-02 DIAGNOSIS — Z880 Allergy status to penicillin: Secondary | ICD-10-CM

## 2022-10-02 DIAGNOSIS — E1165 Type 2 diabetes mellitus with hyperglycemia: Secondary | ICD-10-CM | POA: Diagnosis present

## 2022-10-02 DIAGNOSIS — E871 Hypo-osmolality and hyponatremia: Secondary | ICD-10-CM | POA: Diagnosis present

## 2022-10-02 DIAGNOSIS — E119 Type 2 diabetes mellitus without complications: Secondary | ICD-10-CM

## 2022-10-02 DIAGNOSIS — Z87442 Personal history of urinary calculi: Secondary | ICD-10-CM

## 2022-10-02 DIAGNOSIS — N1832 Chronic kidney disease, stage 3b: Secondary | ICD-10-CM | POA: Diagnosis present

## 2022-10-02 DIAGNOSIS — I4721 Torsades de pointes: Secondary | ICD-10-CM | POA: Diagnosis not present

## 2022-10-02 DIAGNOSIS — J690 Pneumonitis due to inhalation of food and vomit: Secondary | ICD-10-CM | POA: Diagnosis not present

## 2022-10-02 DIAGNOSIS — M1990 Unspecified osteoarthritis, unspecified site: Secondary | ICD-10-CM | POA: Diagnosis present

## 2022-10-02 DIAGNOSIS — E876 Hypokalemia: Secondary | ICD-10-CM | POA: Diagnosis not present

## 2022-10-02 DIAGNOSIS — E8809 Other disorders of plasma-protein metabolism, not elsewhere classified: Secondary | ICD-10-CM | POA: Diagnosis not present

## 2022-10-02 DIAGNOSIS — F0154 Vascular dementia, unspecified severity, with anxiety: Secondary | ICD-10-CM | POA: Diagnosis present

## 2022-10-02 DIAGNOSIS — I5032 Chronic diastolic (congestive) heart failure: Secondary | ICD-10-CM | POA: Diagnosis present

## 2022-10-02 DIAGNOSIS — Z79890 Hormone replacement therapy (postmenopausal): Secondary | ICD-10-CM

## 2022-10-02 DIAGNOSIS — D62 Acute posthemorrhagic anemia: Secondary | ICD-10-CM | POA: Diagnosis not present

## 2022-10-02 DIAGNOSIS — R410 Disorientation, unspecified: Secondary | ICD-10-CM | POA: Diagnosis not present

## 2022-10-02 DIAGNOSIS — I10 Essential (primary) hypertension: Secondary | ICD-10-CM

## 2022-10-02 DIAGNOSIS — R809 Proteinuria, unspecified: Secondary | ICD-10-CM | POA: Diagnosis not present

## 2022-10-02 DIAGNOSIS — Z1152 Encounter for screening for COVID-19: Secondary | ICD-10-CM | POA: Diagnosis not present

## 2022-10-02 DIAGNOSIS — M869 Osteomyelitis, unspecified: Secondary | ICD-10-CM

## 2022-10-02 DIAGNOSIS — E1151 Type 2 diabetes mellitus with diabetic peripheral angiopathy without gangrene: Secondary | ICD-10-CM | POA: Diagnosis present

## 2022-10-02 DIAGNOSIS — E875 Hyperkalemia: Secondary | ICD-10-CM | POA: Diagnosis present

## 2022-10-02 DIAGNOSIS — N179 Acute kidney failure, unspecified: Secondary | ICD-10-CM

## 2022-10-02 DIAGNOSIS — D631 Anemia in chronic kidney disease: Secondary | ICD-10-CM | POA: Diagnosis present

## 2022-10-02 DIAGNOSIS — Z7951 Long term (current) use of inhaled steroids: Secondary | ICD-10-CM

## 2022-10-02 DIAGNOSIS — I130 Hypertensive heart and chronic kidney disease with heart failure and stage 1 through stage 4 chronic kidney disease, or unspecified chronic kidney disease: Secondary | ICD-10-CM | POA: Diagnosis present

## 2022-10-02 DIAGNOSIS — Z87891 Personal history of nicotine dependence: Secondary | ICD-10-CM

## 2022-10-02 DIAGNOSIS — I252 Old myocardial infarction: Secondary | ICD-10-CM

## 2022-10-02 DIAGNOSIS — M8610 Other acute osteomyelitis, unspecified site: Secondary | ICD-10-CM | POA: Diagnosis present

## 2022-10-02 DIAGNOSIS — M86271 Subacute osteomyelitis, right ankle and foot: Secondary | ICD-10-CM | POA: Diagnosis present

## 2022-10-02 DIAGNOSIS — J449 Chronic obstructive pulmonary disease, unspecified: Secondary | ICD-10-CM | POA: Diagnosis present

## 2022-10-02 DIAGNOSIS — E11649 Type 2 diabetes mellitus with hypoglycemia without coma: Secondary | ICD-10-CM

## 2022-10-02 DIAGNOSIS — G8918 Other acute postprocedural pain: Secondary | ICD-10-CM | POA: Diagnosis not present

## 2022-10-02 DIAGNOSIS — G4733 Obstructive sleep apnea (adult) (pediatric): Secondary | ICD-10-CM | POA: Diagnosis present

## 2022-10-02 DIAGNOSIS — Z951 Presence of aortocoronary bypass graft: Secondary | ICD-10-CM

## 2022-10-02 DIAGNOSIS — Z8616 Personal history of COVID-19: Secondary | ICD-10-CM | POA: Diagnosis not present

## 2022-10-02 DIAGNOSIS — T424X5A Adverse effect of benzodiazepines, initial encounter: Secondary | ICD-10-CM | POA: Diagnosis not present

## 2022-10-02 DIAGNOSIS — E785 Hyperlipidemia, unspecified: Secondary | ICD-10-CM | POA: Diagnosis present

## 2022-10-02 DIAGNOSIS — D6489 Other specified anemias: Secondary | ICD-10-CM | POA: Diagnosis not present

## 2022-10-02 DIAGNOSIS — I255 Ischemic cardiomyopathy: Secondary | ICD-10-CM | POA: Diagnosis present

## 2022-10-02 DIAGNOSIS — L97316 Non-pressure chronic ulcer of right ankle with bone involvement without evidence of necrosis: Secondary | ICD-10-CM | POA: Diagnosis present

## 2022-10-02 DIAGNOSIS — Z8249 Family history of ischemic heart disease and other diseases of the circulatory system: Secondary | ICD-10-CM

## 2022-10-02 DIAGNOSIS — I2510 Atherosclerotic heart disease of native coronary artery without angina pectoris: Secondary | ICD-10-CM | POA: Diagnosis present

## 2022-10-02 LAB — CBC WITH DIFFERENTIAL/PLATELET
Abs Immature Granulocytes: 0.63 10*3/uL — ABNORMAL HIGH (ref 0.00–0.07)
Basophils Absolute: 0.1 10*3/uL (ref 0.0–0.1)
Basophils Relative: 0 %
Eosinophils Absolute: 0.6 10*3/uL — ABNORMAL HIGH (ref 0.0–0.5)
Eosinophils Relative: 3 %
HCT: 24 % — ABNORMAL LOW (ref 39.0–52.0)
Hemoglobin: 7.7 g/dL — ABNORMAL LOW (ref 13.0–17.0)
Immature Granulocytes: 3 %
Lymphocytes Relative: 7 %
Lymphs Abs: 1.4 10*3/uL (ref 0.7–4.0)
MCH: 28.2 pg (ref 26.0–34.0)
MCHC: 32.1 g/dL (ref 30.0–36.0)
MCV: 87.9 fL (ref 80.0–100.0)
Monocytes Absolute: 1.8 10*3/uL — ABNORMAL HIGH (ref 0.1–1.0)
Monocytes Relative: 9 %
Neutro Abs: 14.2 10*3/uL — ABNORMAL HIGH (ref 1.7–7.7)
Neutrophils Relative %: 78 %
Platelets: 440 10*3/uL — ABNORMAL HIGH (ref 150–400)
RBC: 2.73 MIL/uL — ABNORMAL LOW (ref 4.22–5.81)
RDW: 14.6 % (ref 11.5–15.5)
WBC: 18.6 10*3/uL — ABNORMAL HIGH (ref 4.0–10.5)
nRBC: 0 % (ref 0.0–0.2)

## 2022-10-02 LAB — CULTURE, BLOOD (ROUTINE X 2)
Culture: NO GROWTH
Culture: NO GROWTH
Special Requests: ADEQUATE
Special Requests: ADEQUATE

## 2022-10-02 LAB — C-REACTIVE PROTEIN: CRP: 33.7 mg/dL — ABNORMAL HIGH (ref <1.0)

## 2022-10-02 LAB — COMPREHENSIVE METABOLIC PANEL
ALT: 40 U/L (ref 0–44)
AST: 58 U/L — ABNORMAL HIGH (ref 15–41)
Albumin: 2.2 g/dL — ABNORMAL LOW (ref 3.5–5.0)
Alkaline Phosphatase: 236 U/L — ABNORMAL HIGH (ref 38–126)
Anion gap: 11 (ref 5–15)
BUN: 44 mg/dL — ABNORMAL HIGH (ref 6–20)
CO2: 21 mmol/L — ABNORMAL LOW (ref 22–32)
Calcium: 8.5 mg/dL — ABNORMAL LOW (ref 8.9–10.3)
Chloride: 102 mmol/L (ref 98–111)
Creatinine, Ser: 1.89 mg/dL — ABNORMAL HIGH (ref 0.61–1.24)
GFR, Estimated: 40 mL/min — ABNORMAL LOW (ref >60)
Glucose, Bld: 112 mg/dL — ABNORMAL HIGH (ref 70–99)
Potassium: 4.1 mmol/L (ref 3.5–5.1)
Sodium: 134 mmol/L — ABNORMAL LOW (ref 135–145)
Total Bilirubin: 0.7 mg/dL (ref 0.3–1.2)
Total Protein: 7.1 g/dL (ref 6.5–8.1)

## 2022-10-02 LAB — LACTIC ACID, PLASMA
Lactic Acid, Venous: 1.8 mmol/L (ref 0.5–1.9)
Lactic Acid, Venous: 1.2 mmol/L (ref 0.5–1.9)

## 2022-10-02 LAB — SEDIMENTATION RATE: Sed Rate: >140 mm/hr — ABNORMAL HIGH (ref 0–16)

## 2022-10-02 MED ORDER — SODIUM CHLORIDE 0.9 % IV SOLN: 2.0000 g | Freq: Three times a day (TID) | INTRAVENOUS | Status: DC

## 2022-10-02 MED ORDER — OXYCODONE-ACETAMINOPHEN 5-325 MG PO TABS
1.0000 | ORAL_TABLET | Freq: Once | ORAL | Status: AC
  Administered 2022-10-02: 1 via ORAL
  Filled 2022-10-02: qty 1

## 2022-10-02 MED ORDER — VANCOMYCIN HCL 1750 MG/350ML IV SOLN
1750.0000 mg | Freq: Once | INTRAVENOUS | Status: AC
  Administered 2022-10-02: 1750 mg via INTRAVENOUS
  Filled 2022-10-02: qty 350

## 2022-10-02 MED ORDER — IOHEXOL 300 MG/ML  SOLN
60.0000 mL | Freq: Once | INTRAMUSCULAR | Status: AC | PRN
  Administered 2022-10-02: 60 mL via INTRAVENOUS

## 2022-10-02 MED ORDER — SODIUM CHLORIDE 0.9 % IV SOLN
2.0000 g | Freq: Two times a day (BID) | INTRAVENOUS | Status: AC
  Administered 2022-10-03 – 2022-10-06 (×8): 2 g via INTRAVENOUS
  Filled 2022-10-02 (×8): qty 12.5

## 2022-10-02 MED ORDER — VANCOMYCIN HCL 750 MG/150ML IV SOLN
750.0000 mg | Freq: Two times a day (BID) | INTRAVENOUS | Status: DC
  Administered 2022-10-03: 750 mg via INTRAVENOUS
  Filled 2022-10-02 (×4): qty 150

## 2022-10-02 MED ORDER — SODIUM CHLORIDE 0.9 % IV SOLN: INTRAVENOUS | Status: DC

## 2022-10-02 MED ORDER — SODIUM CHLORIDE 0.9 % IV SOLN: 2.0000 g | INTRAVENOUS | Status: DC

## 2022-10-02 MED ORDER — SODIUM CHLORIDE 0.9 % IV SOLN
2.0000 g | Freq: Once | INTRAVENOUS | Status: AC
  Administered 2022-10-02: 2 g via INTRAVENOUS
  Filled 2022-10-02: qty 12.5

## 2022-10-02 MED ORDER — CLINDAMYCIN HCL 150 MG PO CAPS
300.0000 mg | ORAL_CAPSULE | Freq: Once | ORAL | Status: AC
  Administered 2022-10-02: 300 mg via ORAL
  Filled 2022-10-02: qty 2

## 2022-10-02 NOTE — ED Provider Notes (Signed)
[facility] EMERGENCY DEPARTMENT Provider Note  CSN: [id number] Arrival date & time: 10/02/22 1647  Chief Complaint(s) Leg Pain  HPI Shane Chambers is a 61 y.o. male with PMH T2 DM on insulin, chronic CHF,'s CAD status post CABG, OSA on CPAP, COPD, rectal cancer in remission who presents emergency department for evaluation of lower extremity pain and cellulitis.  Patient was discharged yesterday after an extended hospital stay for cellulitis and was transitioned from IV antibiotics to oral antibiotics.  Over the last 24 hours, his cellulitis appears to have significantly progressed and patient arrives with purulent bulla and worsening erythema of the right lower extremity when compared to previous images taken yesterday.  Denies chest pain, shortness of breath, headache, fever or other systemic symptoms   Past Medical History Past Medical History:  Diagnosis Date   Anemia    Arthritis    CAD (coronary artery disease)    a. s/p CABG in 03/2020 with LIMA-LAD, RIMA-PL, RA-D1-RI-OM1   Cancer ([place])    rectal   Cellulitis    COPD (chronic obstructive pulmonary disease) ([place])    Depression    Diabetes mellitus without complication (HCC)    GERD (gastroesophageal reflux disease)    History of kidney stones    Hyperlipidemia 12/01/2019   Hypertension    Hypothyroidism    Ischemic cardiomyopathy    a. EF 20-25% by echo in 02/2020 b. at 40% by echo in 03/2020 c. EF normalized to 60-65% by echo in 04/2020   Myocardial infarction [facility])    Peripheral vascular disease ([place])    Sleep apnea    Type 2 diabetes mellitus ([place])    Patient Active Problem List   Diagnosis Date Noted   Cellulitis of right lower extremity 09/25/2022   Lactic acidosis 04/23/2022   Anemia of chronic disease 04/23/2022   Obesity, Class III, BMI 40-49.9 (morbid obesity) ([place]) 04/23/2022   AKI (acute kidney injury) ([place]) 04/22/2022   Elevated troponin 04/22/2022   Cancer of sigmoid colon ([place]) 12/16/2021    Constipation 12/16/2021   GERD (gastroesophageal reflux disease) 12/16/2021   Iron deficiency anemia 09/17/2021   Rectal cancer ([place]) 09/03/2021   Bilateral lower leg cellulitis 29/51/8841   Systolic and diastolic CHF, chronic ([place]) 11/22/2020   CHF exacerbation ([place]) 08/01/2020   Visit for wound check 05/28/2020   OSA (obstructive sleep apnea) 05/24/2020   Acute exacerbation of chronic obstructive pulmonary disease (COPD) ([place]) 04/29/2020   Leukocytosis 04/29/2020   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   Ischemic cardiomyopathy    Coronary artery disease    Acute on chronic combined systolic and diastolic CHF (congestive heart failure) ([place]) 03/05/2020   Acute hyponatremia 03/05/2020   Severe Vitamin D deficiency 12/02/2019   Hypokalemia 12/01/2019   Hyperlipidemia 12/01/2019   BPH (benign prostatic hyperplasia) 12/01/2019   Normocytic anemia 12/01/2019   Pneumonia due to COVID-19 virus 12/01/2019   Hypoxia    SOB (shortness of breath) 11/16/2019   Severe sepsis ([place]) 11/16/2019   Hypertension    Type 2 diabetes mellitus ([place])    Depression    Acute bronchitis with bronchospasm 11/15/2019   Home Medication(s) Prior to Admission medications   Medication Sig Start Date End Date Taking? Authorizing Provider  acetaminophen (TYLENOL) 500 MG tablet Take 1,000 mg by mouth every 8 (eight) hours as needed for fever or moderate pain.    [provider]  albuterol (VENTOLIN HFA) 108 (90 Base) MCG/ACT inhaler Inhale 2 puffs into the lungs every 6 (six) hours  as needed for wheezing or shortness of breath. 05/02/22   Johnson, Clanford L, MD  Amino Acids-Protein Hydrolys (PRO-STAT AWC PO) Take 30 mLs by mouth 2 (two) times daily.    [provider]  amitriptyline (ELAVIL) 75 MG tablet Take 75 mg by mouth at bedtime. 08/29/22   [provider]  aspirin 81 MG EC tablet Take 1 tablet (81 mg total) by mouth daily with breakfast. 11/29/20   Roxan Hockey, MD  atorvastatin (LIPITOR)  80 MG tablet Take 80 mg by mouth at bedtime.    [provider]  budesonide-formoterol (SYMBICORT) 160-4.5 MCG/ACT inhaler Inhale 2 puffs into the lungs in the morning and at bedtime. 05/24/20   Rigoberto Noel, MD  cefadroxil (DURICEF) 500 MG capsule Take 1 capsule (500 mg total) by mouth 2 (two) times daily for 9 days. 10/01/22 10/10/22  Barb Merino, MD  Cholecalciferol (VITAMIN D) 50 MCG (2000 UT) tablet Take 2,000 Units by mouth daily.    [provider]  docusate sodium (COLACE) 100 MG capsule Take 100 mg by mouth daily. 09/23/22   [provider]  doxycycline (VIBRA-TABS) 100 MG tablet Take 1 tablet (100 mg total) by mouth 2 (two) times daily for 9 days. 10/01/22 10/10/22  Barb Merino, MD  FERROUS GLUCONATE PO Take 325 mg by mouth daily. 09/23/22   [provider]  gabapentin (NEURONTIN) 300 MG capsule Take 2 capsules (600 mg total) by mouth 2 (two) times daily. 05/02/22   Johnson, Clanford L, MD  insulin aspart protamine- aspart (NOVOLOG MIX 70/30) (70-30) 100 UNIT/ML injection Inject 0.08 mLs (8 Units total) into the skin 2 (two) times daily with a meal. 05/02/22   Johnson, Clanford L, MD  Lactobacillus (ACIDOPHILUS PO) Take 2 capsules by mouth 2 (two) times daily. 09/24/22 10/08/22  [provider]  levothyroxine (SYNTHROID) 75 MCG tablet Take 75 mcg by mouth daily before breakfast.    [provider]  loperamide HCl (IMODIUM) 2 MG/15ML solution Take 4 mg by mouth every 6 (six) hours as needed for diarrhea or loose stools.    [provider]  losartan (COZAAR) 50 MG tablet Take 50 mg by mouth daily.    [provider]  magnesium oxide (MAG-OX) 400 MG tablet Take 400 mg by mouth daily.    [provider]  metFORMIN (GLUCOPHAGE) 1000 MG tablet Take 1,000 mg by mouth 2 (two) times daily.    [provider]  metoprolol succinate (TOPROL-XL) 25 MG 24 hr tablet Take 0.5 tablets (12.5 mg total) by mouth 2 (two)  times daily. 08/21/20   Verta Ellen., NP  Multiple Vitamin (MULTIVITAMIN) tablet Take 1 tablet by mouth daily.    [provider]  omeprazole (PRILOSEC) 40 MG capsule Take 40 mg by mouth daily. 01/01/22   [provider]  oxyCODONE (OXY IR/ROXICODONE) 5 MG immediate release tablet Take 1 tablet (5 mg total) by mouth every 6 (six) hours as needed for up to 3 days for moderate pain or severe pain. 10/01/22 10/04/22  Barb Merino, MD  polyethylene glycol (MIRALAX / GLYCOLAX) 17 g packet Take 17 g by mouth daily as needed for moderate constipation.    [provider]  polyvinyl alcohol (LIQUIFILM TEARS) 1.4 % ophthalmic solution Place 1 drop into both eyes daily.    [provider]  potassium chloride SA (KLOR-CON M) 20 MEQ tablet Take 1 tablet (20 mEq total) by mouth daily. 05/05/22   Murlean Iba, MD  tamsulosin (  FLOMAX) 0.4 MG CAPS capsule Take 0.8 mg by mouth daily.    [provider]  torsemide (DEMADEX) 20 MG tablet Take 3 tablets (60 mg total) by mouth daily. 05/03/22   Johnson, Clanford L, MD  venlafaxine XR (EFFEXOR-XR) 75 MG 24 hr capsule Take 225 mg by mouth daily. 01/21/22   [provider]                                                                                                                                    Past Surgical History Past Surgical History:  Procedure Laterality Date   BIOPSY  07/18/2021   Procedure: BIOPSY;  Surgeon: Eloise Harman, DO;  [facility];  Service: Endoscopy;;   BIOPSY  01/09/2022   Procedure: BIOPSY;  Surgeon: Eloise Harman, DO;  [facility];  Service: Endoscopy;;   COLONOSCOPY WITH PROPOFOL N/A 07/18/2021   [place]: 15 millimeter polyp removed from the sigmoid colon, 5 mm polyp removed from sigmoid colon.  Nonbleeding internal hemorrhoids.  Significant looping of the colon. sigmoid path showed invasive colonic adenocarcinoma involving tubular adenoma (invades  to depth of 55m, carcinoma 15mfrom margin, no lymphovascular invasion, no poorly differentiated component.   CORONARY ARTERY BYPASS GRAFT N/A 03/13/2020   Procedure: CORONARY ARTERY BYPASS GRAFTING (CABG) times five using bilateral Internal mammary arteries and left radial artery;  Surgeon: AtWonda OldsMD;  [place] [facility]: [facility];  Laterality: N/A;   DENTAL SURGERY     ESOPHAGOGASTRODUODENOSCOPY (EGD) WITH PROPOFOL N/A 07/18/2021   [place]: 1 gastric polyp status post biopsy, gastritis. gastric bx with slight chronic inflammation and no H.pyori. GEJ polypectomy with mild inflammation only   FLEXIBLE SIGMOIDOSCOPY N/A 08/26/2021   [place]: Nonbleeding internal hemorrhoids.  15 mm ulcers from previous polypectomy found in the rectum.  No evidence of previous polyp.  Located 5 to 8 cm from anal verge.   FLEXIBLE SIGMOIDOSCOPY N/A 01/09/2022   Procedure: FLEXIBLE SIGMOIDOSCOPY;  Surgeon: CaEloise HarmanDO;  [facility];  Service: Endoscopy;  Laterality: N/A;   POLYPECTOMY  07/18/2021   Procedure: POLYPECTOMY INTESTINAL;  Surgeon: CaEloise HarmanDO;  [facility];  Service: Endoscopy;;   RADIAL ARTERY HARVEST Left 03/13/2020   Procedure: RABonne Terre  Surgeon: AtWonda OldsMD;  [place] [facility] [place]: [place];  Laterality: Left;   RIGHT/LEFT HEART CATH AND CORONARY ANGIOGRAPHY N/A 03/07/2020   Procedure: RIGHT/LEFT HEART CATH AND CORONARY ANGIOGRAPHY;  Surgeon: CoSherren MochaMD;  [place];  Service: Cardiovascular;  Laterality: N/A;   SUBMUCOSAL LIFTING INJECTION  01/09/2022   Procedure: SUBMUCOSAL LIFTING INJECTION;  Surgeon: CaEloise HarmanDO;  [facility];  Service: Endoscopy;;   SUBMUCOSAL TATTOO INJECTION  01/09/2022   Procedure: SUBMUCOSAL TATTOO INJECTION;  Surgeon: CaEloise HarmanDO;  [facility];  Service: Endoscopy;;   TEE WITHOUT CARDIOVERSION N/A  03/13/2020   Procedure: TRANSESOPHAGEAL ECHOCARDIOGRAM (TEE);  Surgeon: [name], MD;  Location: [place];  Service: Open Heart Surgery;  Laterality: N/A;   Family History Family History  Problem Relation Age of Onset   Hypertension Mother     Social History Social History   Tobacco Use   Smoking status: Former    Packs/day: 1.50    Types: Cigarettes    Quit date: 05/25/1995    Years since quitting: 27.3   Smokeless tobacco: Never  Vaping Use   Vaping Use: Never used  Substance Use Topics   Alcohol use: Not Currently    Comment: rarely   Drug use: No   Allergies Ace inhibitors, Other, and Penicillins  Review of Systems Review of Systems  Skin:  Positive for rash and wound.    Physical Exam Vital Signs  I have reviewed the triage vital signs BP (!) 153/73 (BP Location: Left Arm)   Pulse 78   Temp 98.4 F (36.9 C) (Oral)   Resp 20   SpO2 99%   Physical Exam Constitutional:      General: He is not in acute distress.    Appearance: Normal appearance.  HENT:     Head: Normocephalic and atraumatic.     Nose: No congestion or rhinorrhea.  Eyes:     General:        Right eye: No discharge.        Left eye: No discharge.     Extraocular Movements: Extraocular movements intact.     Pupils: Pupils are equal, round, and reactive to light.  Cardiovascular:     Rate and Rhythm: Normal rate and regular rhythm.     Heart sounds: No murmur heard. Pulmonary:     Effort: No respiratory distress.     Breath sounds: No wheezing or rales.  Abdominal:     General: There is no distension.     Tenderness: There is no abdominal tenderness.  Musculoskeletal:        General: Swelling present. Normal range of motion.     Cervical back: Normal range of motion.  Skin:    General: Skin is warm and dry.     Findings: Erythema and lesion present.  Neurological:     General: No focal deficit present.     Mental Status: He is alert.     ED Results and  Treatments Labs (all labs ordered are listed, but only abnormal results are displayed) Labs Reviewed  COMPREHENSIVE METABOLIC PANEL  CBC WITH DIFFERENTIAL/PLATELET  SEDIMENTATION RATE  C-REACTIVE PROTEIN  LACTIC ACID, PLASMA  LACTIC ACID, PLASMA                                                                                                                          Radiology No results found.  Pertinent labs & imaging results that were available during my care of the patient were reviewed by me  and considered in my medical decision making (see MDM for details).  Medications Ordered in ED Medications  ceFEPIme (MAXIPIME) 2 g in sodium chloride 0.9 % 100 mL IVPB (has no administration in time range)  clindamycin (CLEOCIN) capsule 300 mg (has no administration in time range)  oxyCODONE-acetaminophen (PERCOCET/ROXICET) 5-325 MG per tablet 1 tablet (has no administration in time range)  vancomycin (VANCOREADY) IVPB 1750 mg/350 mL (has no administration in time range)  vancomycin (VANCOREADY) IVPB 750 mg/150 mL (has no administration in time range)                                                                                                                                     Procedures .Critical Care  Performed by: Teressa Lower, MD Authorized by: Teressa Lower, MD   Critical care provider statement:    Critical care time (minutes):  30   Critical care was time spent personally by me on the following activities:  Development of treatment plan with patient or surrogate, discussions with consultants, evaluation of patient's response to treatment, examination of patient, ordering and review of laboratory studies, ordering and review of radiographic studies, ordering and performing treatments and interventions, pulse oximetry, re-evaluation of patient's condition and review of old charts   (including critical care time)  Medical Decision Making / ED Course   This patient presents  to the ED for concern of wound, leg pain, this involves an extensive number of treatment options, and is a complaint that carries with it a high risk of complications and morbidity.  The differential diagnosis includes cellulitis, necrotizing fasciitis, myositis, abscess, failure of outpatient antibiotic regimen  MDM: Patient seen emergency room for evaluation of leg wounds.  Physical exam reveals significant worsening of the previously seen cellulitis with purulent bulla as seen above.  Physical exam otherwise unremarkable.  Laboratory evaluation with a leukocytosis to 18.7, creatinine 1.89, ESR greater than 140, sed rate 33.7.  With new bulla, I did have initial concern for necrotizing fasciitis and broad-spectrum biotics with bank, cefepime clinda was initiated and an x-ray of the tib-fib showed no soft tissue gas.  I did have increasing concern despite this x-ray finding and a CT of the lower extremity was performed that showed severe cellulitis and probable mild fasciitis as well as multiple complex fluid collections surrounding the medial and lateral aspects of the ankle suspicious for abscess or septic tenosynovitis, likely osteomyelitis at the medial malleolus.  I spoke with the orthopedic surgeon on-call Dr. Ninfa Linden who did not have concern for necrotizing fasciitis and states that admission at [facility] would be reasonable if symptoms progress and vital signs become unstable.  Patient admitted to medicine on broad-spectrum antibiotics.  Additional history obtained:  -External records from outside source obtained and reviewed including: Chart review including previous notes, labs, imaging, consultation notes   Lab Tests: -I ordered, reviewed, and interpreted labs.   The pertinent results include:  Labs Reviewed  COMPREHENSIVE METABOLIC PANEL  CBC WITH DIFFERENTIAL/PLATELET  SEDIMENTATION RATE  C-REACTIVE PROTEIN  LACTIC ACID, PLASMA  LACTIC ACID, PLASMA      Imaging Studies ordered: I  ordered imaging studies including x-ray tib-fib, CT tib-fib I independently visualized and interpreted imaging. I agree with the radiologist interpretation   Medicines ordered and prescription drug management: Meds ordered this encounter  Medications   ceFEPIme (MAXIPIME) 2 g in sodium chloride 0.9 % 100 mL IVPB   clindamycin (CLEOCIN) capsule 300 mg   oxyCODONE-acetaminophen (PERCOCET/ROXICET) 5-325 MG per tablet 1 tablet   vancomycin (VANCOREADY) IVPB 1750 mg/350 mL    Order Specific Question:   Indication:    Answer:   Wound Infection   vancomycin (VANCOREADY) IVPB 750 mg/150 mL    Order Specific Question:   Indication:    Answer:   Cellulitis    -I have reviewed the patients home medicines and have made adjustments as needed  Critical interventions Broad-spectrum antibiotics, multiple stat imaging studies, stat orthopedic consultation with concern for possible necrotizing fasciitis  Consultations Obtained: I requested consultation with the orthopedic surgeon on-call Dr. Ninfa Linden,  and discussed lab and imaging findings as well as pertinent plan - they recommend: Antibiotics and admission at site with surgical capability if symptoms were to worsen   Cardiac Monitoring: The patient was maintained on a cardiac monitor.  I personally viewed and interpreted the cardiac monitored which showed an underlying rhythm of: NSR  Social Determinants of Health:  Factors impacting patients care include: none   Reevaluation: After the interventions noted above, I reevaluated the patient and found that they have :stayed the same  Co morbidities that complicate the patient evaluation  Past Medical History:  Diagnosis Date   Anemia    Arthritis    CAD (coronary artery disease)    a. s/p CABG in 03/2020 with LIMA-LAD, RIMA-PL, RA-D1-RI-OM1   Cancer ([place])    rectal   Cellulitis    COPD (chronic obstructive pulmonary disease) ([place])    Depression    Diabetes mellitus without  complication ([place])    GERD (gastroesophageal reflux disease)    History of kidney stones    Hyperlipidemia 12/01/2019   Hypertension    Hypothyroidism    Ischemic cardiomyopathy    a. EF 20-25% by echo in 02/2020 b. at 40% by echo in 03/2020 c. EF normalized to 60-65% by echo in 04/2020   Myocardial infarction [facility])    Peripheral vascular disease ([place])    Sleep apnea    Type 2 diabetes mellitus ([place])       Dispostion: I considered admission for this patient, and patient was admitted due to worsening cellulitis     Final Clinical Impression(s) / ED Diagnoses Final diagnoses:  None     _0 @    Teressa Lower, MD 10/03/22 1229

## 2022-10-02 NOTE — Assessment & Plan Note (Signed)
-   Holding losartan setting AKI - Continue metoprolol

## 2022-10-02 NOTE — H&P (Signed)
History and Physical    Patient: Shane Chambers SWF:[id number] DOB: 1961/04/08 DOA: 10/02/2022 DOS: the patient was seen and examined on 10/02/2022 PCP: Caprice Renshaw, MD  Patient coming from: Home  Chief Complaint:  Chief Complaint  Patient presents with   Leg Pain   HPI: Shane Chambers is a 61 y.o. male with medical history significant of anemia, coronary artery disease, COPD, depression, diabetes mellitus type 2, GERD, hyperlipidemia, hypertension, hypothyroidism, ischemic cardiomyopathy, peripheral vascular disease, sleep apnea, and more presents the ED with a chief complaint of worsening cellulitis.  Patient reports that the RN at his facility noted that his cellulitis was worse and he needed to come into the ER.  Of note patient was recently admitted on 16 November and discharged on the 22nd.  At that admission he was treated for cellulitis of the right lower extremity with IV vancomycin and ceftriaxone.  He had blood cultures that were positive for bacillus.  He was transitioned to doxycycline and cefadroxil on discharge.  Patient reports that since he has been back to his facility his legs become more painful, more erythematous, and is draining more.  Patient describes the pain as sharp and throbbing.  Weightbearing makes it worse.  Its been so bad that he could not transfer from bed to chair.  The pain medication given in the ED helped to make the pain better, but did not resolve it completely.  Patient reports that the leg has been erythematous today.  He is also noted fluid-filled vesicles which he calls bubbles, with white fluid inside.  He has not seen any open purulent drainage.  Patient denies any fevers.  He is unsure if the leg has become malodorous or not.  He reports he was feeling very tired today, and the staff at the facility told him he was acting "goofy."  Patient has no other associated symptoms at this time.  Patient does not smoke, does not drink, does not use illicit  drugs.  He is vaccinated for COVID.  Patient is full code. Review of Systems: As mentioned in the history of present illness. All other systems reviewed and are negative. Past Medical History:  Diagnosis Date   Anemia    Arthritis    CAD (coronary artery disease)    a. s/p CABG in 03/2020 with LIMA-LAD, RIMA-PL, [id number]   Cancer ([place])    rectal   Cellulitis    COPD (chronic obstructive pulmonary disease) ([place])    Depression    Diabetes mellitus without complication (HCC)    GERD (gastroesophageal reflux disease)    History of kidney stones    Hyperlipidemia 12/01/2019   Hypertension    Hypothyroidism    Ischemic cardiomyopathy    a. EF 20-25% by echo in 02/2020 b. at 40% by echo in 03/2020 c. EF normalized to 60-65% by echo in 04/2020   Myocardial infarction [facility])    Peripheral vascular disease ([place])    Sleep apnea    Type 2 diabetes mellitus ([place])    Past Surgical History:  Procedure Laterality Date   BIOPSY  07/18/2021   Procedure: BIOPSY;  Surgeon: Eloise Harman, DO;  [facility];  Service: Endoscopy;;   BIOPSY  01/09/2022   Procedure: BIOPSY;  Surgeon: Eloise Harman, DO;  [facility];  Service: Endoscopy;;   COLONOSCOPY WITH PROPOFOL N/A 07/18/2021   Carver: 15 millimeter polyp removed from the sigmoid colon, 5 mm polyp removed from sigmoid colon.  Nonbleeding internal hemorrhoids.  Significant looping of the colon. sigmoid path showed invasive colonic adenocarcinoma involving tubular adenoma (invades to depth of 33m, carcinoma 157mfrom margin, no lymphovascular invasion, no poorly differentiated component.   CORONARY ARTERY BYPASS GRAFT N/A 03/13/2020   Procedure: CORONARY ARTERY BYPASS GRAFTING (CABG) times five using bilateral Internal mammary arteries and left radial artery;  Surgeon: AtWonda OldsMD;  [facility] Service: Open Heart Surgery;  Laterality: N/A;   DENTAL SURGERY     ESOPHAGOGASTRODUODENOSCOPY (EGD) WITH  PROPOFOL N/A 07/18/2021   Carver: 1 gastric polyp status post biopsy, gastritis. gastric bx with slight chronic inflammation and no H.pyori. GEJ polypectomy with mild inflammation only   FLEXIBLE SIGMOIDOSCOPY N/A 08/26/2021   Carver: Nonbleeding internal hemorrhoids.  15 mm ulcers from previous polypectomy found in the rectum.  No evidence of previous polyp.  Located 5 to 8 cm from anal verge.   FLEXIBLE SIGMOIDOSCOPY N/A 01/09/2022   Procedure: FLEXIBLE SIGMOIDOSCOPY;  Surgeon: CaEloise HarmanDO;  [facility];  Service: Endoscopy;  Laterality: N/A;   POLYPECTOMY  07/18/2021   Procedure: POLYPECTOMY INTESTINAL;  Surgeon: CaEloise HarmanDO;  [facility];  Service: Endoscopy;;   RADIAL ARTERY HARVEST Left 03/13/2020   Procedure: [place]  Surgeon: AtWonda OldsMD;  [place] [facility] Service: Open [facility] Surgery;  Laterality: Left;   RIGHT/LEFT HEART CATH AND CORONARY ANGIOGRAPHY N/A 03/07/2020   Procedure: RIGHT/LEFT HEART CATH AND CORONARY ANGIOGRAPHY;  Surgeon: CoSherren MochaMD;  [facility];  Service: Cardiovascular;  Laterality: N/A;   SUBMUCOSAL LIFTING INJECTION  01/09/2022   Procedure: SUBMUCOSAL LIFTING INJECTION;  Surgeon: CaEloise HarmanDO;  [facility];  Service: Endoscopy;;   SUBMUCOSAL TATTOO INJECTION  01/09/2022   Procedure: SUBMUCOSAL TATTOO INJECTION;  Surgeon: CaEloise HarmanDO;  [facility];  Service: Endoscopy;;   TEE WITHOUT CARDIOVERSION N/A 03/13/2020   Procedure: TRANSESOPHAGEAL ECHOCARDIOGRAM (TEE);  Surgeon: AtWonda OldsMD;  [place]: [facility] Service: Open Heart Surgery;  Laterality: N/A;   Social History:  reports that he quit smoking about 27 years ago. His smoking use included cigarettes. He smoked an average of 1.5 packs per day. He has never used smokeless tobacco. He reports that he does not currently use alcohol. He reports that he does not use  drugs.  Allergies  Allergen Reactions   Ace Inhibitors Swelling and Cough    [facility]  Other Itching    [name]   Penicillins     Has patient had a PCN reaction causing immediate rash, facial/tongue/throat swelling, SOB or lightheadedness with hypotension: Unknown Has patient had a PCN reaction causing severe rash involving mucus membranes or skin necrosis: Unknown Has patient had a PCN reaction that required hospitalization: Unknown Has patient had a PCN reaction occurring within the last 10 years: Unknown If all of the above answers are "NO", then may proceed with Cephalosporin use.     Family History  Problem Relation Age of Onset   Hypertension Mother     Prior to Admission medications   Medication Sig Start Date End Date Taking? Authorizing Provider  acetaminophen (TYLENOL) 500 MG tablet Take 1,000 mg by mouth every 8 (eight) hours as needed for fever or moderate pain.    [provider]  albuterol (VENTOLIN HFA) 108 (90 Base) MCG/ACT inhaler Inhale 2 puffs into the lungs every 6 (  six) hours as needed for wheezing or shortness of breath. 05/02/22   Johnson, Clanford L, MD  Amino Acids-Protein Hydrolys (PRO-STAT AWC PO) Take 30 mLs by mouth 2 (two) times daily.    [provider]  amitriptyline (ELAVIL) 75 MG tablet Take 75 mg by mouth at bedtime. 08/29/22   [provider]  aspirin 81 MG EC tablet Take 1 tablet (81 mg total) by mouth daily with breakfast. 11/29/20   Roxan Hockey, MD  atorvastatin (LIPITOR) 80 MG tablet Take 80 mg by mouth at bedtime.    [provider]  budesonide-formoterol (SYMBICORT) 160-4.5 MCG/ACT inhaler Inhale 2 puffs into the lungs in the morning and at bedtime. 05/24/20   Rigoberto Noel, MD  cefadroxil (DURICEF) 500 MG capsule Take 1 capsule (500 mg total) by mouth 2 (two) times daily for 9 days. 10/01/22 10/10/22  Barb Merino, MD  Cholecalciferol (VITAMIN D) 50  MCG (2000 UT) tablet Take 2,000 Units by mouth daily.    [provider]  docusate sodium (COLACE) 100 MG capsule Take 100 mg by mouth daily. 09/23/22   [provider]  doxycycline (VIBRA-TABS) 100 MG tablet Take 1 tablet (100 mg total) by mouth 2 (two) times daily for 9 days. 10/01/22 10/10/22  Barb Merino, MD  FERROUS GLUCONATE PO Take 325 mg by mouth daily. 09/23/22   [provider]  gabapentin (NEURONTIN) 300 MG capsule Take 2 capsules (600 mg total) by mouth 2 (two) times daily. 05/02/22   Johnson, Clanford L, MD  insulin aspart protamine- aspart (NOVOLOG MIX 70/30) (70-30) 100 UNIT/ML injection Inject 0.08 mLs (8 Units total) into the skin 2 (two) times daily with a meal. 05/02/22   Johnson, Clanford L, MD  Lactobacillus (ACIDOPHILUS PO) Take 2 capsules by mouth 2 (two) times daily. 09/24/22 10/08/22  [provider]  levothyroxine (SYNTHROID) 75 MCG tablet Take 75 mcg by mouth daily before breakfast.    [provider]  loperamide HCl (IMODIUM) 2 MG/15ML solution Take 4 mg by mouth every 6 (six) hours as needed for diarrhea or loose stools.    [provider]  losartan (COZAAR) 50 MG tablet Take 50 mg by mouth daily.    [provider]  magnesium oxide (MAG-OX) 400 MG tablet Take 400 mg by mouth daily.    [provider]  metFORMIN (GLUCOPHAGE) 1000 MG tablet Take 1,000 mg by mouth 2 (two) times daily.    [provider]  metoprolol succinate (TOPROL-XL) 25 MG 24 hr tablet Take 0.5 tablets (12.5 mg total) by mouth 2 (two) times daily. 08/21/20   Verta Ellen., NP  Multiple Vitamin (MULTIVITAMIN) tablet Take 1 tablet by mouth daily.    [provider]  omeprazole (PRILOSEC) 40 MG capsule Take 40 mg by mouth daily. 01/01/22   [provider]  oxyCODONE (OXY IR/ROXICODONE) 5 MG immediate release tablet Take 1 tablet (5 mg total) by mouth every 6 (six) hours as needed for up to 3 days for  moderate pain or severe pain. 10/01/22 10/04/22  Barb Merino, MD  polyethylene glycol (MIRALAX / GLYCOLAX) 17 g packet Take 17 g by mouth daily as needed for moderate constipation.    [provider]  polyvinyl alcohol (LIQUIFILM TEARS) 1.4 % ophthalmic solution Place 1 drop into both eyes daily.    [provider]  potassium chloride SA (KLOR-CON M) 20 MEQ tablet Take 1 tablet (20 mEq total) by mouth daily. 05/05/22   Murlean Iba, MD  tamsulosin (FLOMAX) 0.4 MG CAPS capsule Take 0.8 mg by mouth daily.    [provider]  torsemide (DEMADEX) 20 MG tablet Take 3 tablets (60 mg total) by mouth daily. 05/03/22   Johnson, Clanford L, MD  venlafaxine XR (EFFEXOR-XR) 75 MG 24 hr capsule Take 225 mg by mouth daily. 01/21/22   [provider]    Physical Exam: Vitals:   10/02/22 1830 10/02/22 1900 10/02/22 2000 10/02/22 2130  BP: (!) 115/59 135/65 (!) 156/75 129/75  Pulse: 81 78 77 77  Resp: (!) _0 Temp:      TempSrc:      SpO2: 100% 98% 100% 98%   1.  General: Patient lying supine in bed, chronically ill-appearing  2. Psychiatric: Alert and oriented x 3, mood and behavior normal for situation, pleasant and cooperative with exam   3. Neurologic: Speech and language are normal, face is symmetric, functional quadriplegic with very weak extremities, at baseline without acute deficits on limited exam   4. HEENMT:  Head is atraumatic, normocephalic, pupils reactive to light, neck is supple, trachea is midline, mucous membranes are moist   5. Respiratory : Lungs are clear to auscultation bilaterally without wheezing, rhonchi, rales, no cyanosis, no increase in work of breathing or accessory muscle use   6. Cardiovascular : Heart rate normal, rhythm is regular, no murmurs, rubs or gallops, no peripheral edema, peripheral pulses palpated   7. Gastrointestinal:  Abdomen is soft, nondistended, nontender to palpation bowel sounds active, no  masses or organomegaly palpated   8. Skin:  Right lower extremity is erythematous including the foot, ankle, moving up two thirds of the way towards the knee, there are white opaque fluid-filled vesicles, no overt drainage, skin peeling   9.Musculoskeletal:  Lower extremities are tender to palpation, no acute deformities  Data Reviewed: In the ED Temp 98.4, heart rate 76-81, respiratory rate 19-21, blood pressure 115/59-156/78, satting 98-100% on 2 L nasal cannula At the time of my exam patient is nasal cannula is on his forehead and he satting at 100% Leukocytosis 18.6, hemoglobin 7.7, platelets 440 Chemistry reveals an elevated BUN at 44, elevated creatinine 1.9, decreased bicarb 21 Sed rate is greater than 140 X-ray tib-fib shows no gas and diffuse subcu edema CT the right lower extremity shows severe cellulitis with probable mild fasciitis.  Multiple fluid-filled collections surrounding the medial and lateral aspects of the ankle.  Changes indicating syndesmosis.  Probable medial malleolus osteo recommending MRI patient was started on clindamycin, cefepime, vancomycin Patient was given Percocet for pain control Dr. Shanon Brow consulted and advised that they will see patient if transferred [facility] Admission requested for cellulitis and probable osteomyelitis  Assessment and Plan: * Cellulitis - With leukocytosis 18.6 - Tib-fib x-ray shows no evidence of osteo, no gas, diffuse subcu edema\ -CT right lower extremity shows severe cellulitis with probable mild fasciitis without pyomyositis.  Multiple complex fluid collections surrounding the medial and lateral aspects of the ankle.  Findings indicating syndesmosis.  And probable medial malleolus osteomyelitis - Patient was started on clindamycin, cefepime, vancomycin - On previous admission blood cultures were positive for bacillus species - Repeat blood cultures - Patient had been discharged on doxycycline and reports that he is  likely taking it since he lives at a SNF where his medications are administered to him - Continue vancomycin and cefepime - Dr. Shanon Brow was consulted from the ED and  will see patient when the patient is transferred to [facility] - Last hospitalization patient had a negative right lower extremity DVT ultrasound - Patient has been treated with IV Rocephin and vancomycin and then transition to Doxy at discharge - Continue to monitor  Hypoalbuminemia - Albumin low at 2.2 - Encourage nutrient dense diet especially when trying to heal from this infection -  Hypothyroidism - Continue Synthroid  Osteomyelitis (HCC) - Continue vancomycin and cefepime - See plan for cellulitis as there is overlap between these 2 plans - MRI right lower extremity ordered - ESR greater than 140   AKI (acute kidney injury) ([place]) - Creatinine at last discharge was 1.44, now 1.89 - Most likely prerenal - Gentle IV hydration - Hold losartan, PPI - Hold other nephrotoxic agents when possible - Recheck in the a.m.  GERD (gastroesophageal reflux disease) - Holding PPI in the setting of AKI  Hyperlipidemia - Continue statin  Type 2 diabetes mellitus (HCC) - Basal insulin dose at home is 8 units - We will start with just sliding scale here - We will add basal insulin if needed - Carb modified diet  Hypertension - Holding losartan setting AKI - Continue metoprolol      Advance Care Planning:   Code Status: Prior  Consults: Dr. Shanon Brow  Family Communication: No family at bedside  Severity of Illness: The appropriate patient status for this patient is INPATIENT. Inpatient status is judged to be reasonable and necessary in order to provide the required intensity of service to ensure the patient's safety. The patient's presenting symptoms, physical exam findings, and initial radiographic and laboratory data in the context of their chronic comorbidities is felt to place them at high risk for further  clinical deterioration. Furthermore, it is not anticipated that the patient will be medically stable for discharge from the hospital within 2 midnights of admission.   * I certify that at the point of admission it is my clinical judgment that the patient will require inpatient hospital care spanning beyond 2 midnights from the point of admission due to high intensity of service, high risk for further deterioration and high frequency of surveillance required.*  Author: Rolla Plate, DO 10/02/2022 10:29 PM  For on call review [email or web address].

## 2022-10-02 NOTE — Progress Notes (Addendum)
Pharmacy Antibiotic Note  Shane Chambers is a 61 y.o. male admitted on 10/02/2022 with cellulitis.  Pt reports R lower leg pain, swelling, and blisters. Pt was recently discharged on 11/22 from admission w/ cellulitis. Pharmacy has been consulted for vancomycin dosing.  Cefadroxil 500 BID + doxycycline 100 BID for outpatient therapy (started 09/29/22)  Plan: Vancomycin 1750 mg IV x1 followed by vancomycin '750mg'$  IV q 12h  Vancomycin levels per protocol PRN, goal trough 10-'20mg'$ /mL F/u renal func, culture data as able, and clinical picture Cefepime, clindamycin per MD    Temp (24hrs), Avg:98.4 F (36.9 C), Min:98.4 F (36.9 C), Max:98.4 F (36.9 C)  Recent Labs  Lab 09/25/22 1900 09/26/22 0404 09/27/22 0617 09/28/22 0547 09/29/22 0835 09/29/22 1330 09/30/22 0358 10/01/22 0411  WBC  --    < > 12.6* 12.4* 12.8* 13.1* 16.1* 14.7*  CREATININE  --    < > 1.71* 1.48* 1.59*  --  1.49* 1.44*  LATICACIDVEN 2.0*  --   --   --   --   --   --   --    < > = values in this interval not displayed.    Estimated Creatinine Clearance: 70 mL/min (A) (by C-G formula based on SCr of 1.44 mg/dL (H)).    Allergies  Allergen Reactions   Ace Inhibitors Swelling and Cough    (Not on MAR at [facility] [place])   Other Itching    [name]   Penicillins     Has patient had a PCN reaction causing immediate rash, facial/tongue/throat swelling, SOB or lightheadedness with hypotension: Unknown Has patient had a PCN reaction causing severe rash involving mucus membranes or skin necrosis: Unknown Has patient had a PCN reaction that required hospitalization: Unknown Has patient had a PCN reaction occurring within the last 10 years: Unknown If all of the above answers are "NO", then may proceed with Cephalosporin use.     Antimicrobials this admission: Vancomycin 11/23> Clindamycin x1  Cefepime 11/23>  Dose adjustments this admission:   Microbiology results:   Thank you  for allowing pharmacy to be a part of this patient's care.  Carolin Guernsey 10/02/2022 5:25 PM  ---  Consult added for cefepime-- Start cefepime 2g IV q12h

## 2022-10-02 NOTE — Assessment & Plan Note (Addendum)
-   Continue vancomycin and cefepime - Dr. Sharol Given consulted by Dr. Ninfa Linden and will see 11/25

## 2022-10-02 NOTE — Assessment & Plan Note (Addendum)
-   Creatinine at last discharge was 1.44, now 1.89 - Most likely prerenal - Gentle IV hydration - Hold losartan, PPI temporarily  - Hold other nephrotoxic agents when possible--DC MORPHINE; replaced with dilaudid 11/24 - Follow BMP

## 2022-10-02 NOTE — ED Triage Notes (Signed)
BIB EMS for right lower leg pain, swelling, and blisters. Pt d/c 2 days ago for cellulitis to LE

## 2022-10-02 NOTE — Assessment & Plan Note (Addendum)
-   Basal insulin dose at home is 8 units -  add basal insulin if needed - Carb modified diet CBG (last 3)  Recent Labs    09/30/22 2140 10/01/22 0756 10/03/22 1539  GLUCAP 245* 125* 180*

## 2022-10-02 NOTE — ED Notes (Signed)
Spoke with Freda Munro at [facility] regarding pt's disposition.

## 2022-10-02 NOTE — Assessment & Plan Note (Signed)
-   Albumin low at 2.2 - Encourage nutrient dense diet especially when trying to heal from this infection -

## 2022-10-02 NOTE — Assessment & Plan Note (Signed)
Continue Synthroid °

## 2022-10-02 NOTE — Assessment & Plan Note (Signed)
Continue statin. 

## 2022-10-02 NOTE — Assessment & Plan Note (Addendum)
-   no current symptoms; monitor

## 2022-10-02 NOTE — Assessment & Plan Note (Addendum)
-   With leukocytosis 18.6 - Tib-fib x-ray shows no evidence of osteo, no gas, diffuse subcu edema\ -CT right lower extremity shows severe cellulitis with probable mild fasciitis without pyomyositis.  Multiple complex fluid collections surrounding the medial and lateral aspects of the ankle.  Findings indicating syndesmosis.  And probable medial malleolus osteomyelitis - Patient was started on clindamycin, cefepime, vancomycin - On previous admission blood cultures were positive for bacillus species - Repeat blood cultures - Patient had been discharged on doxycycline and reports that he is likely taking it since he lives at a SNF where his medications are administered to him - Continue vancomycin and cefepime - Dr. Ninfa Linden was consulted from the ED and  will see patient when the patient is transferred to [facility] - Dr. Sharol Given will see on 11/25

## 2022-10-03 ENCOUNTER — Inpatient Hospital Stay (HOSPITAL_COMMUNITY): Payer: Medicare Other

## 2022-10-03 DIAGNOSIS — M869 Osteomyelitis, unspecified: Secondary | ICD-10-CM | POA: Diagnosis not present

## 2022-10-03 DIAGNOSIS — E1159 Type 2 diabetes mellitus with other circulatory complications: Secondary | ICD-10-CM | POA: Diagnosis not present

## 2022-10-03 DIAGNOSIS — N179 Acute kidney failure, unspecified: Secondary | ICD-10-CM | POA: Diagnosis not present

## 2022-10-03 DIAGNOSIS — L03115 Cellulitis of right lower limb: Secondary | ICD-10-CM | POA: Diagnosis not present

## 2022-10-03 LAB — CBC WITH DIFFERENTIAL/PLATELET
Abs Immature Granulocytes: 0.67 10*3/uL — ABNORMAL HIGH (ref 0.00–0.07)
Basophils Absolute: 0.1 10*3/uL (ref 0.0–0.1)
Basophils Relative: 0 %
Eosinophils Absolute: 0.6 10*3/uL — ABNORMAL HIGH (ref 0.0–0.5)
Eosinophils Relative: 3 %
HCT: 23.9 % — ABNORMAL LOW (ref 39.0–52.0)
Hemoglobin: 7.5 g/dL — ABNORMAL LOW (ref 13.0–17.0)
Immature Granulocytes: 4 %
Lymphocytes Relative: 7 %
Lymphs Abs: 1.2 10*3/uL (ref 0.7–4.0)
MCH: 27.6 pg (ref 26.0–34.0)
MCHC: 31.4 g/dL (ref 30.0–36.0)
MCV: 87.9 fL (ref 80.0–100.0)
Monocytes Absolute: 1.4 10*3/uL — ABNORMAL HIGH (ref 0.1–1.0)
Monocytes Relative: 8 %
Neutro Abs: 13.9 10*3/uL — ABNORMAL HIGH (ref 1.7–7.7)
Neutrophils Relative %: 78 %
Platelets: 464 10*3/uL — ABNORMAL HIGH (ref 150–400)
RBC: 2.72 MIL/uL — ABNORMAL LOW (ref 4.22–5.81)
RDW: 14.6 % (ref 11.5–15.5)
WBC: 17.8 10*3/uL — ABNORMAL HIGH (ref 4.0–10.5)
nRBC: 0 % (ref 0.0–0.2)

## 2022-10-03 LAB — COMPREHENSIVE METABOLIC PANEL
ALT: 33 U/L (ref 0–44)
AST: 43 U/L — ABNORMAL HIGH (ref 15–41)
Albumin: 2 g/dL — ABNORMAL LOW (ref 3.5–5.0)
Alkaline Phosphatase: 206 U/L — ABNORMAL HIGH (ref 38–126)
Anion gap: 12 (ref 5–15)
BUN: 45 mg/dL — ABNORMAL HIGH (ref 6–20)
CO2: 22 mmol/L (ref 22–32)
Calcium: 8.6 mg/dL — ABNORMAL LOW (ref 8.9–10.3)
Chloride: 102 mmol/L (ref 98–111)
Creatinine, Ser: 1.82 mg/dL — ABNORMAL HIGH (ref 0.61–1.24)
GFR, Estimated: 42 mL/min — ABNORMAL LOW (ref >60)
Glucose, Bld: 91 mg/dL (ref 70–99)
Potassium: 3.8 mmol/L (ref 3.5–5.1)
Sodium: 136 mmol/L (ref 135–145)
Total Bilirubin: 0.8 mg/dL (ref 0.3–1.2)
Total Protein: 6.6 g/dL (ref 6.5–8.1)

## 2022-10-03 LAB — GLUCOSE, CAPILLARY
Glucose-Capillary: 180 mg/dL — ABNORMAL HIGH (ref 70–99)
Glucose-Capillary: 143 mg/dL — ABNORMAL HIGH (ref 70–99)

## 2022-10-03 LAB — MAGNESIUM: Magnesium: 1.5 mg/dL — ABNORMAL LOW (ref 1.7–2.4)

## 2022-10-03 MED ORDER — ACETAMINOPHEN 325 MG PO TABS: 650.0000 mg | ORAL_TABLET | Freq: Four times a day (QID) | ORAL | Status: DC | PRN

## 2022-10-03 MED ORDER — ASPIRIN 81 MG PO TBEC
81.0000 mg | DELAYED_RELEASE_TABLET | Freq: Every day | ORAL | Status: DC
  Administered 2022-10-03 – 2022-10-17 (×11): 81 mg via ORAL
  Filled 2022-10-03 (×12): qty 1

## 2022-10-03 MED ORDER — MAGNESIUM SULFATE 4 GM/100ML IV SOLN
4.0000 g | Freq: Once | INTRAVENOUS | Status: AC
  Administered 2022-10-03: 4 g via INTRAVENOUS
  Filled 2022-10-03: qty 100

## 2022-10-03 MED ORDER — ONDANSETRON HCL 4 MG/2ML IJ SOLN
4.0000 mg | Freq: Four times a day (QID) | INTRAMUSCULAR | Status: DC | PRN
  Administered 2022-10-03 (×2): 4 mg via INTRAVENOUS
  Filled 2022-10-03 (×2): qty 2

## 2022-10-03 MED ORDER — GADOBUTROL 1 MMOL/ML IV SOLN
10.0000 mL | Freq: Once | INTRAVENOUS | Status: AC | PRN
  Administered 2022-10-03: 10 mL via INTRAVENOUS

## 2022-10-03 MED ORDER — OXYCODONE HCL 5 MG PO TABS
5.0000 mg | ORAL_TABLET | ORAL | Status: DC | PRN
  Administered 2022-10-03 – 2022-10-05 (×5): 5 mg via ORAL
  Filled 2022-10-03 (×6): qty 1

## 2022-10-03 MED ORDER — HYDROMORPHONE HCL 1 MG/ML IJ SOLN
0.5000 mg | INTRAMUSCULAR | Status: DC | PRN
  Administered 2022-10-05 (×4): 0.5 mg via INTRAVENOUS
  Filled 2022-10-03 (×5): qty 0.5

## 2022-10-03 MED ORDER — ATORVASTATIN CALCIUM 80 MG PO TABS
80.0000 mg | ORAL_TABLET | Freq: Every day | ORAL | Status: DC
  Administered 2022-10-03 – 2022-10-05 (×4): 80 mg via ORAL
  Filled 2022-10-03: qty 2
  Filled 2022-10-03 (×4): qty 1

## 2022-10-03 MED ORDER — METOPROLOL SUCCINATE ER 25 MG PO TB24
12.5000 mg | ORAL_TABLET | Freq: Two times a day (BID) | ORAL | Status: DC
  Administered 2022-10-03 – 2022-10-06 (×7): 12.5 mg via ORAL
  Filled 2022-10-03 (×10): qty 1

## 2022-10-03 MED ORDER — SODIUM CHLORIDE 0.9 % IV SOLN: INTRAVENOUS | Status: DC

## 2022-10-03 MED ORDER — TAMSULOSIN HCL 0.4 MG PO CAPS
0.8000 mg | ORAL_CAPSULE | Freq: Every day | ORAL | Status: DC
  Administered 2022-10-03 – 2022-10-17 (×11): 0.8 mg via ORAL
  Filled 2022-10-03 (×13): qty 2

## 2022-10-03 MED ORDER — GABAPENTIN 300 MG PO CAPS
600.0000 mg | ORAL_CAPSULE | Freq: Two times a day (BID) | ORAL | Status: DC
  Administered 2022-10-03 – 2022-10-04 (×5): 600 mg via ORAL
  Filled 2022-10-03 (×6): qty 2

## 2022-10-03 MED ORDER — HEPARIN SODIUM (PORCINE) 5000 UNIT/ML IJ SOLN
5000.0000 [IU] | Freq: Three times a day (TID) | INTRAMUSCULAR | Status: DC
  Administered 2022-10-03 – 2022-10-17 (×43): 5000 [IU] via SUBCUTANEOUS
  Filled 2022-10-03 (×43): qty 1

## 2022-10-03 MED ORDER — POLYETHYLENE GLYCOL 3350 17 G PO PACK: 17.0000 g | PACK | Freq: Every day | ORAL | Status: DC | PRN

## 2022-10-03 MED ORDER — MOMETASONE FURO-FORMOTEROL FUM 200-5 MCG/ACT IN AERO
2.0000 | INHALATION_SPRAY | Freq: Two times a day (BID) | RESPIRATORY_TRACT | Status: DC
  Administered 2022-10-03 – 2022-10-08 (×10): 2 via RESPIRATORY_TRACT
  Filled 2022-10-03 (×2): qty 8.8

## 2022-10-03 MED ORDER — ONDANSETRON HCL 4 MG PO TABS: 4.0000 mg | ORAL_TABLET | Freq: Four times a day (QID) | ORAL | Status: DC | PRN

## 2022-10-03 MED ORDER — LEVOTHYROXINE SODIUM 75 MCG PO TABS
75.0000 ug | ORAL_TABLET | Freq: Every day | ORAL | Status: DC
  Administered 2022-10-03 – 2022-10-04 (×2): 75 ug via ORAL
  Filled 2022-10-03 (×3): qty 1
  Filled 2022-10-03: qty 2
  Filled 2022-10-03: qty 1

## 2022-10-03 MED ORDER — VENLAFAXINE HCL ER 75 MG PO CP24
225.0000 mg | ORAL_CAPSULE | Freq: Every day | ORAL | Status: DC
  Administered 2022-10-03 – 2022-10-10 (×4): 225 mg via ORAL
  Filled 2022-10-03 (×5): qty 3
  Filled 2022-10-03: qty 6

## 2022-10-03 MED ORDER — AMITRIPTYLINE HCL 25 MG PO TABS
75.0000 mg | ORAL_TABLET | Freq: Every day | ORAL | Status: DC
  Administered 2022-10-03 – 2022-10-05 (×4): 75 mg via ORAL
  Filled 2022-10-03 (×3): qty 3
  Filled 2022-10-03: qty 1
  Filled 2022-10-03 (×6): qty 3

## 2022-10-03 MED ORDER — VANCOMYCIN HCL 1250 MG/250ML IV SOLN
1250.0000 mg | INTRAVENOUS | Status: DC
  Administered 2022-10-03 – 2022-10-04 (×2): 1250 mg via INTRAVENOUS
  Filled 2022-10-03 (×2): qty 250

## 2022-10-03 MED ORDER — ACETAMINOPHEN 650 MG RE SUPP: 650.0000 mg | Freq: Four times a day (QID) | RECTAL | Status: DC | PRN

## 2022-10-03 MED ORDER — MORPHINE SULFATE (PF) 2 MG/ML IV SOLN
2.0000 mg | INTRAVENOUS | Status: DC | PRN
  Administered 2022-10-03: 2 mg via INTRAVENOUS
  Filled 2022-10-03: qty 1

## 2022-10-03 MED ORDER — HEPARIN SODIUM (PORCINE) 5000 UNIT/ML IJ SOLN
5000.0000 [IU] | Freq: Three times a day (TID) | INTRAMUSCULAR | Status: DC
  Filled 2022-10-03: qty 1

## 2022-10-03 NOTE — Progress Notes (Signed)
Patient ID: Shane Chambers, male   DOB: 07-14-1961, 61 y.o.   MRN: [id number] The patient still has not arrived to [facility] from [facility].  I am now post call animal for the rest of the day after long call day yesterday and surgery today already.  I did let Dr. Sharol Given my partner know of this gentleman.  He was able to at least look at the photos in the patient's chart as well as his radiographic studies.  For now the plan should be to continue IV antibiotics until further orthopedic assessment.  Dr. Sharol Given will be operating tomorrow morning (Saturday).  He will likely check on the patient tomorrow.

## 2022-10-03 NOTE — TOC Initial Note (Signed)
Transition of Care [facility]) - Initial/Assessment Note    Patient Details  Name: Shane Chambers MRN: [id number] Date of Birth: 12-29-1960  [facility] CM/SW Contact:    Shade Flood, LCSW Phone Number: 10/03/2022, 10:10 AM  Clinical Narrative:                  Pt admitted from [facility]. Pt was discharged from [facility] back to the SNF two days ago. During that admission, CSW spoke to [place] with the facility who states that pt is a long term resident. Pt will return at D/C. CSW started pts Fl2, to be completed at D/C. TOC to follow.    Expected Discharge Plan: Long Term Nursing Home Barriers to Discharge: Continued Medical Work up   Patient Goals and CMS Choice Patient states their goals for this hospitalization and ongoing recovery are:: return to LTC      Expected Discharge Plan and Services Expected Discharge Plan: [place] In-house Referral: Clinical Social Work     Living arrangements for the past 2 months: [place]                                      Prior Living Arrangements/Services Living arrangements for the past 2 months: [place]   Patient language and need for interpreter reviewed:: Yes Do you feel safe going back to the place where you live?: Yes      Need for Family Participation in Patient Care: No (Comment) Care giver support system in place?: Yes (comment)   Criminal Activity/Legal Involvement Pertinent to Current Situation/Hospitalization: No - Comment as needed  Activities of Daily Living      Permission Sought/Granted                  Emotional Assessment       Orientation: : Oriented to Self, Oriented to Place, Oriented to  Time, Oriented to Situation Alcohol / Substance Use: Not Applicable Psych Involvement: No (comment)  Admission diagnosis:  Cellulitis [L03.90] Patient Active Problem List   Diagnosis Date Noted   Cellulitis 10/02/2022    Osteomyelitis ([place]) 10/02/2022   Hypothyroidism 10/02/2022   Hypoalbuminemia 10/02/2022   Cellulitis of right lower extremity 09/25/2022   Lactic acidosis 04/23/2022   Anemia of chronic disease 04/23/2022   Obesity, Class III, BMI 40-49.9 (morbid obesity) ([place]) 04/23/2022   AKI (acute kidney injury) ([place]) 04/22/2022   Elevated troponin 04/22/2022   Cancer of sigmoid colon ([place]) 12/16/2021   Constipation 12/16/2021   GERD (gastroesophageal reflux disease) 12/16/2021   Iron deficiency anemia 09/17/2021   Rectal cancer ([place]) 09/03/2021   Bilateral lower leg cellulitis 00/17/4944   Systolic and diastolic CHF, chronic ([place]) 11/22/2020   CHF exacerbation ([place]) 08/01/2020   Visit for wound check 05/28/2020   OSA (obstructive sleep apnea) 05/24/2020   Acute exacerbation of chronic obstructive pulmonary disease (COPD) ([place]) 04/29/2020   Leukocytosis 04/29/2020   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   Ischemic cardiomyopathy    Coronary artery disease    Acute on chronic combined systolic and diastolic CHF (congestive heart failure) ([place]) 03/05/2020   Acute hyponatremia 03/05/2020   Severe Vitamin D deficiency 12/02/2019   Hypokalemia 12/01/2019   Hyperlipidemia 12/01/2019   BPH (benign prostatic hyperplasia) 12/01/2019   Normocytic anemia 12/01/2019   Pneumonia due to COVID-19 virus 12/01/2019   Hypoxia  SOB (shortness of breath) 11/16/2019   Severe sepsis ([place]) 11/16/2019   Hypertension    Type 2 diabetes mellitus ([place])    Depression    Acute bronchitis with bronchospasm 11/15/2019   PCP:  Caprice Renshaw, MD Pharmacy:   [facility] [place] [address] [place] [address] Phone: [phone] Fax: [phone]     Social Determinants of Health (SDOH) Interventions    Readmission Risk Interventions    10/03/2022   10:08 AM 09/26/2022    1:16 PM 04/23/2022   10:20 AM  Readmission Risk Prevention Plan  Transportation Screening  Complete Complete Complete  PCP or Specialist Appt within 3-5 Days  Complete Complete  HRI or [place]  Complete Complete  Social Work Consult for [place] Planning/Counseling  Complete Complete  Palliative Care Screening  Not Applicable Not Applicable  Medication Review Press photographer)  Complete Complete  HRI or Home Care Consult Complete    SW Recovery Care/Counseling Consult Complete    Palliative Care Screening Not Applicable    Skilled Nursing Facility Complete

## 2022-10-03 NOTE — Progress Notes (Signed)
PROGRESS NOTE   Shane Chambers  DPO:[id number] DOB: 1961-08-06 DOA: 10/02/2022 PCP: Caprice Renshaw, MD   Chief Complaint  Patient presents with   Leg Pain   Level of care: Telemetry Medical  Brief Admission History:   61 y.o. male with medical history significant of anemia, coronary artery disease, COPD, depression, diabetes mellitus type 2, GERD, hyperlipidemia, hypertension, hypothyroidism, ischemic cardiomyopathy, peripheral vascular disease, sleep apnea, and more presents the ED with a chief complaint of worsening cellulitis.  Patient reports that the RN at his facility noted that his cellulitis was worse and he needed to come into the ER.  Of note patient was recently admitted on 16 November and discharged on the 22nd.  At that admission he was treated for cellulitis of the right lower extremity with IV vancomycin and ceftriaxone.  He had blood cultures that were positive for bacillus.  He was transitioned to doxycycline and cefadroxil on discharge.  Patient reports that since he has been back to his facility his legs become more painful, more erythematous, and is draining more.  Patient describes the pain as sharp and throbbing.  Weightbearing makes it worse.  Its been so bad that he could not transfer from bed to chair.  The pain medication given in the ED helped to make the pain better, but did not resolve it completely.  Patient reports that the leg has been erythematous today.  He is also noted fluid-filled vesicles which he calls bubbles, with white fluid inside.  He has not seen any open purulent drainage.  Patient denies any fevers.  He is unsure if the leg has become malodorous or not.  He reports he was feeling very tired today, and the staff at the facility told him he was acting "goofy."  Patient has no other associated symptoms at this time.    Assessment and Plan: * Cellulitis - With leukocytosis 18.6 - Tib-fib x-ray shows no evidence of osteo, no gas, diffuse subcu edema\ -CT  right lower extremity shows severe cellulitis with probable mild fasciitis without pyomyositis.  Multiple complex fluid collections surrounding the medial and lateral aspects of the ankle.  Findings indicating syndesmosis.  And probable medial malleolus osteomyelitis - Patient was started on clindamycin, cefepime, vancomycin - On previous admission blood cultures were positive for bacillus species - Repeat blood cultures - Patient had been discharged on doxycycline and reports that he is likely taking it since he lives at a SNF where his medications are administered to him - Continue vancomycin and cefepime - Dr. Ninfa Linden was consulted from the ED and  will see patient when the patient is transferred to [facility] - Dr. Sharol Given will see on 11/25   Hypoalbuminemia - Albumin low at 2.2 - Encourage nutrient dense diet especially when trying to heal from this infection -  Hypothyroidism - Continue Synthroid  Osteomyelitis ([place]) - Continue vancomycin and cefepime - Dr. Sharol Given consulted by Dr. Ninfa Linden and will see 11/25    AKI (acute kidney injury) ([place]) - Creatinine at last discharge was 1.44, now 1.89 - Most likely prerenal - Gentle IV hydration - Hold losartan, PPI temporarily  - Hold other nephrotoxic agents when possible--DC MORPHINE; replaced with dilaudid 11/24 - Follow BMP   GERD (gastroesophageal reflux disease) - no current symptoms; monitor   Hyperlipidemia - Continue statin  Type 2 diabetes mellitus (HCC) - Basal insulin dose at home is 8 units -  add basal insulin if needed - Carb modified diet CBG (last 3)  Recent  Labs    09/30/22 2140 10/01/22 0756 10/03/22 1539  GLUCAP 245* 125* 180*     Hypertension - Holding losartan setting AKI - Continue metoprolol  DVT prophylaxis: [place] heparin  Code Status: full  Family Communication:  Disposition: Status is: Inpatient Remains inpatient appropriate because: IV antibiotics    Consultants:  Orthopedics Dr.  Earl Lagos. Sharol Given  Procedures:   Antimicrobials:  Cefepime 11/23>> Vancomycin 11/23>> Subjective: Pt seen in ED while waiting for transfer to [facility] for surgical consultation.  He c/o right leg ankle and foot pain.   Objective: Vitals:   10/03/22 1200 10/03/22 1330 10/03/22 1345 10/03/22 1400  BP: 137/66 (!) 143/63 (!) 143/63 (!) 147/65  Pulse: 77 81 79 75  Resp: '18 16 20 '$ (!) 21  Temp:   (!) 97.2 F (36.2 C) 98.2 F (36.8 C)  TempSrc:   Axillary Oral  SpO2: 97% 100% 100% 100%    Intake/Output Summary (Last 24 hours) at 10/03/2022 1552 Last data filed at 10/03/2022 2297 Gross per 24 hour  Intake 100 ml  Output 950 ml  Net -850 ml   There were no vitals filed for this visit. Examination:  General exam: Appears calm and comfortable  Respiratory system: Clear to auscultation. Respiratory effort normal. Cardiovascular system: normal S1 & S2 heard. No JVD, murmurs, rubs, gallops or clicks. No pedal edema. Gastrointestinal system: Abdomen is nondistended, soft and nontender. No organomegaly or masses felt. Normal bowel sounds heard. Central nervous system: Alert and oriented. No focal neurological deficits. Extremities: edematous bullous lesions, cellulitic changes right foot and left leg   Skin: see photos Psychiatry: Judgement and insight appear normal. Mood & affect appropriate.   Data Reviewed: I have personally reviewed following labs and imaging studies  CBC: Recent Labs  Lab 09/29/22 1330 09/29/22 2016 09/30/22 0358 10/01/22 0411 10/02/22 1720 10/03/22 0519  WBC 13.1*  --  16.1* 14.7* 18.6* 17.8*  NEUTROABS  --   --   --   --  14.2* 13.9*  HGB 7.3* 7.5* 7.6* 7.6* 7.7* 7.5*  HCT 23.4* 23.5* 23.8* 23.6* 24.0* 23.9*  MCV 88.3  --  87.8 87.1 87.9 87.9  PLT 319  --  356 418* 440* 464*    Basic Metabolic Panel: Recent Labs  Lab 09/29/22 0835 09/30/22 0358 10/01/22 0411 10/02/22 1720 10/03/22 0519  NA 139 138 137 134* 136  K 3.8 3.7 3.3* 4.1 3.8  CL 106 104  105 102 102  CO2 '25 24 24 '$ 21* 22  GLUCOSE 122* 140* 124* 112* 91  BUN 38* 35* 32* 44* 45*  CREATININE 1.59* 1.49* 1.44* 1.89* 1.82*  CALCIUM 8.5* 8.6* 8.5* 8.5* 8.6*  MG  --   --   --   --  1.5*    CBG: Recent Labs  Lab 09/30/22 1209 09/30/22 1704 09/30/22 2140 10/01/22 0756 10/03/22 1539  GLUCAP 183* 147* 245* 125* 180*    Recent Results (from the past 240 hour(s))  Blood Culture (routine x 2)     Status: Abnormal   Collection Time: 09/25/22  5:20 PM   Specimen: Right Antecubital; Blood  Result Value Ref Range Status   Specimen Description   Final    RIGHT ANTECUBITAL Performed at [facility], [address], [place], [place] [address]    Special Requests   Final    BOTTLES DRAWN AEROBIC AND ANAEROBIC Blood Culture results may not be optimal due to an inadequate volume of blood received in culture bottles Performed at [facility], [address], [place], [place] [address]    Culture  Setup Time   Final    WBC PRESENT, PREDOMINANTLY PMN GRAM POSITIVE RODS ANAEROBIC BOTTLE ONLY Gram Stain Report Called to,Read Back By and Verified With: M.LAWSON BY LBASTON 1632, 09/27/22 Performed at [facility], [address] [place], [place] [address]    Culture (A)  Final    BACILLUS SPECIES Standardized susceptibility testing for this organism is not available. Performed at [facility], [place] [address] [place], [place] [address]    Report Status 09/29/2022 FINAL  Final  Resp Panel by RT-PCR (Flu A&B, Covid) Anterior Nasal Swab     Status: None   Collection Time: 09/25/22  5:59 PM   Specimen: Anterior Nasal Swab  Result Value Ref Range Status   SARS Coronavirus 2 by RT PCR NEGATIVE NEGATIVE Final    Comment: (NOTE) SARS-CoV-2 target nucleic acids are NOT DETECTED.  The SARS-CoV-2 RNA is generally detectable in upper respiratory specimens during the acute phase of infection. The lowest concentration of SARS-CoV-2 viral copies this assay can detect is 138 copies/mL. A  negative result does not preclude SARS-Cov-2 infection and should not be used as the sole basis for treatment or other patient management decisions. A negative result may occur with  improper specimen collection/handling, submission of specimen other than nasopharyngeal swab, presence of viral mutation(s) within the areas targeted by this assay, and inadequate number of viral copies(<138 copies/mL). A negative result must be combined with clinical observations, patient history, and epidemiological information. The expected result is Negative.  Fact Sheet for Patients:  EntrepreneurPulse.com.au  Fact Sheet for Healthcare Providers:  IncredibleEmployment.be  This test is no t yet approved or cleared by the [place] FDA and  has been authorized for detection and/or diagnosis of SARS-CoV-2 by FDA under an Emergency Use Authorization (EUA). This EUA will remain  in effect (meaning this test can be used) for the duration of the COVID-19 declaration under Section 564(b)(1) of the Act, 21 U.S.C.section 360bbb-3(b)(1), unless the authorization is terminated  or revoked sooner.       Influenza A by PCR NEGATIVE NEGATIVE Final   Influenza B by PCR NEGATIVE NEGATIVE Final    Comment: (NOTE) The Xpert Xpress SARS-CoV-2/FLU/RSV plus assay is intended as an aid in the diagnosis of influenza from Nasopharyngeal swab specimens and should not be used as a sole basis for treatment. Nasal washings and aspirates are unacceptable for Xpert Xpress SARS-CoV-2/FLU/RSV testing.  Fact Sheet for Patients: EntrepreneurPulse.com.au  Fact Sheet for Healthcare Providers: IncredibleEmployment.be  This test is not yet approved or cleared by the [place] FDA and has been authorized for detection and/or diagnosis of SARS-CoV-2 by FDA under an Emergency Use Authorization (EUA). This EUA will remain in effect (meaning this test can  be used) for the duration of the COVID-19 declaration under Section 564(b)(1) of the Act, 21 U.S.C. section 360bbb-3(b)(1), unless the authorization is terminated or revoked.  Performed at [facility], [address], [place], [place] [address]   Blood Culture (routine x 2)     Status: None   Collection Time: 09/25/22  6:00 PM   Specimen: Left Antecubital; Blood  Result Value Ref Range Status   Specimen Description LEFT ANTECUBITAL  Final   Special Requests   Final    BOTTLES DRAWN AEROBIC ONLY Blood Culture adequate volume   Culture   Final    NO GROWTH 5 DAYS Performed at [facility], [address] [place], [place] [address]  Report Status 09/30/2022 FINAL  Final  Culture, blood (Routine X 2) w Reflex to ID Panel     Status: None   Collection Time: 09/27/22  5:30 PM   Specimen: Right Antecubital; Blood  Result Value Ref Range Status   Specimen Description   Final    RIGHT ANTECUBITAL BOTTLES DRAWN AEROBIC AND ANAEROBIC   Special Requests Blood Culture adequate volume  Final   Culture   Final    NO GROWTH 5 DAYS Performed at [facility], [address] [place], [place] [address]    Report Status 10/02/2022 FINAL  Final  Culture, blood (Routine X 2) w Reflex to ID Panel     Status: None   Collection Time: 09/27/22  5:48 PM   Specimen: Left Antecubital; Blood  Result Value Ref Range Status   Specimen Description   Final    LEFT ANTECUBITAL BOTTLES DRAWN AEROBIC AND ANAEROBIC   Special Requests Blood Culture adequate volume  Final   Culture   Final    NO GROWTH 5 DAYS Performed at [facility], [address] [place], [place] [address]    Report Status 10/02/2022 FINAL  Final     Radiology Studies: MR TIBIA FIBULA RIGHT W WO CONTRAST  Result Date: 10/03/2022 CLINICAL DATA:  Soft tissue infection suspected, lower leg, xray done EXAM: MRI OF LOWER RIGHT EXTREMITY WITHOUT AND WITH CONTRAST TECHNIQUE: Multiplanar, multisequence MR imaging of the right lower extremity  was performed both before and after administration of intravenous contrast. CONTRAST:  51m GADAVIST GADOBUTROL 1 MMOL/ML IV SOLN COMPARISON:  CT 10/02/2022 FINDINGS: Bones/Joint/Cartilage There is periarticular marrow edema and low T1 signal in the distal tibia, talus, and lateral malleolus with associated marrow enhancement. There is a tibiotalar joint effusion with a lobular appearance and mildly thickened synovial enhancement. 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 Ligaments The interosseous membrane is unremarkable. Muscles and Tendons There is tenosynovitis of the posterior tibial tendon and peroneal tendons above and below the medial malleolus, favored to be reactive. No acute tendon tear. There is generalized muscle atrophy of the right lower extremity. Soft tissues There is skin thickening and subcutaneous soft tissue swelling of the lower extremity with blister like skin lesions noted. IMPRESSION: Findings are suspicious for right ankle septic arthritis with periarticular osteomyelitis. Moderate size tibiotalar joint effusion. Diffuse lower extremity soft tissue swelling, as can be seen in cellulitis or lymphedema. Tenosynovitis of the posterior tibial tendon and peroneal tendons, favored to be reactive. Electronically Signed   By: JMaurine SimmeringM.D.   On: 10/03/2022 11:30   CT EXTREMITY LOWER RIGHT W CONTRAST  Result Date: 10/02/2022 CLINICAL DATA:  Soft tissue swelling of the right lower extremity. EXAM: CT OF THE LOWER RIGHT EXTREMITY WITH CONTRAST TECHNIQUE: Multidetector CT imaging of the lower right extremity was performed according to the standard protocol following intravenous contrast administration. RADIATION DOSE REDUCTION: This exam was performed according to the departmental dose-optimization program which includes automated exposure control, adjustment of the mA and/or kV according to patient size and/or use of iterative reconstruction technique.  CONTRAST:  623mOMNIPAQUE IOHEXOL 300 MG/ML  SOLN COMPARISON:  Radiographs, same date. FINDINGS: Diffuse skin thickening and marked subcutaneous soft tissue swelling/edema/fluid consistent with severe cellulitis. Suspect scattered skin blisters. There are complex fluid collections surrounding the medial and aspects of the ankle. Medially the fluid collection measures approximately the 4.2 x 3.6 x 2.1 cm. This could represent an abscess or septic tenosynovitis. It is also adjacent to  a small erosive lesion involving the superficial aspect of the medial malleolus. Could not exclude osteomyelitis. Laterally there is a smaller fluid collection anterior to the fibula measuring 18 mm on image 221/9. More inferiorly below the lateral malleolus there is a second complex fluid collection which measures a maximum 2.7 cm. This could be an abscess or septic tenosynovitis associated with the peroneal tendons. There is also an ankle joint effusion but no obvious findings for septic arthritis. Erosive changes along the deep cortex of the fibula at the level of the tibiofibular syndesmosis. Indeterminate finding. Significant fatty atrophy of the calf musculature. Suspect myofasciitis without definite pyomyositis. Advanced vascular calcifications. IMPRESSION: 1. Severe cellulitis and probable myofasciitis without definite pyomyositis. 2. Multiple complex fluid collections surrounding the medial and lateral aspects of the ankle as described above. These are suspicious for abscesses or septic tenosynovitis. 3. Erosive changes along the deep cortex of the fibula at the level of the tibiofibular syndesmosis. Indeterminate finding. 4. Erosive changes along the superficial cortex of the medial malleolus suspicious for osteomyelitis. 5. Small ankle joint effusion but no obvious findings for septic arthritis. 6. MRI without and with contrast (if possible) is recommended for further evaluation of this complex process. Electronically Signed    By: Marijo Sanes M.D.   On: 10/02/2022 19:40   DG Tibia/Fibula Right  Result Date: 10/02/2022 CLINICAL DATA:  Right leg pain swelling and blisters. Rule out soft tissue gas. EXAM: RIGHT TIBIA AND FIBULA - 2 VIEW COMPARISON:  None Available. FINDINGS: Cortical margins of the tibia and fibula are intact. No fracture, erosion, or bony destruction. Degenerative change of the knee and to a lesser extent ankle. Diffuse subcutaneous edema. Prominent vascular calcifications. There also multiple soft tissue calcifications. No soft tissue gas or radiopaque foreign body. IMPRESSION: 1. Diffuse subcutaneous edema. No soft tissue gas or radiopaque foreign body. 2. No evidence of osteomyelitis. 3. Prominent vascular calcifications. Multiple small rounded soft tissue calcifications, nonspecific. Electronically Signed   By: Keith Rake M.D.   On: 10/02/2022 17:45    Scheduled Meds:  amitriptyline  75 mg Oral QHS   aspirin EC  81 mg Oral Q breakfast   atorvastatin  80 mg Oral QHS   gabapentin  600 mg Oral BID   heparin  5,000 Units Subcutaneous Q8H   levothyroxine  75 mcg Oral QAC breakfast   metoprolol succinate  12.5 mg Oral BID   mometasone-formoterol  2 puff Inhalation BID   tamsulosin  0.8 mg Oral Daily   venlafaxine XR  225 mg Oral Daily   Continuous Infusions:  sodium chloride     ceFEPime (MAXIPIME) IV Stopped (10/03/22 0641)   vancomycin       LOS: 1 day   Time spent: 35 mins  Ilda Laskin Wynetta Emery, MD How to contact the [facility] Attending or Consulting provider [facility] or covering provider during after hours [place], for this patient?  Check the care team in [facility] and look for a) attending/consulting TRH provider listed and b) the [facility] team listed Log into [email or web address] and use [place]'s universal password to access. If you do not have the password, please contact the hospital operator. Locate the [facility] provider you are looking for under [facility] and page to a number that you can be  directly reached. If you still have difficulty reaching the provider, please page the [facility] (Director on Call) for the Hospitalists listed on amion for assistance.  10/03/2022, 3:52 PM

## 2022-10-03 NOTE — ED Notes (Signed)
IV attempts have failed. Another RN to try [place] IV

## 2022-10-03 NOTE — Hospital Course (Signed)
61 y.o. male with medical history significant of anemia, coronary artery disease, COPD, depression, diabetes mellitus type 2, GERD, hyperlipidemia, hypertension, hypothyroidism, ischemic cardiomyopathy, peripheral vascular disease, sleep apnea, and more presents the ED with a chief complaint of worsening cellulitis.  Patient reports that the RN at his facility noted that his cellulitis was worse and he needed to come into the ER.  Of note patient was recently admitted on 16 November and discharged on the 22nd.  At that admission he was treated for cellulitis of the right lower extremity with IV vancomycin and ceftriaxone.  He had blood cultures that were positive for bacillus.  He was transitioned to doxycycline and cefadroxil on discharge.  Patient reports that since he has been back to his facility his legs become more painful, more erythematous, and is draining more.  Patient describes the pain as sharp and throbbing.  Weightbearing makes it worse.  Its been so bad that he could not transfer from bed to chair.  The pain medication given in the ED helped to make the pain better, but did not resolve it completely.  Patient reports that the leg has been erythematous today.  He is also noted fluid-filled vesicles which he calls bubbles, with white fluid inside.  He has not seen any open purulent drainage.  Patient denies any fevers.  He is unsure if the leg has become malodorous or not.  He reports he was feeling very tired today, and the staff at the facility told him he was acting "goofy."  Patient has no other associated symptoms at this time.

## 2022-10-03 NOTE — ED Notes (Signed)
Pt's IV was pulled out while pt was sleeping. IV antibiotics delayed while attempting to establish an IV

## 2022-10-03 NOTE — ED Notes (Signed)
Breakfast tray given. °

## 2022-10-04 DIAGNOSIS — M86271 Subacute osteomyelitis, right ankle and foot: Secondary | ICD-10-CM

## 2022-10-04 DIAGNOSIS — M009 Pyogenic arthritis, unspecified: Secondary | ICD-10-CM

## 2022-10-04 LAB — CBC WITH DIFFERENTIAL/PLATELET
Abs Immature Granulocytes: 0.55 10*3/uL — ABNORMAL HIGH (ref 0.00–0.07)
Basophils Absolute: 0.1 10*3/uL (ref 0.0–0.1)
Basophils Relative: 0 %
Eosinophils Absolute: 0.6 10*3/uL — ABNORMAL HIGH (ref 0.0–0.5)
Eosinophils Relative: 3 %
HCT: 21.7 % — ABNORMAL LOW (ref 39.0–52.0)
Hemoglobin: 7.1 g/dL — ABNORMAL LOW (ref 13.0–17.0)
Immature Granulocytes: 3 %
Lymphocytes Relative: 7 %
Lymphs Abs: 1.3 10*3/uL (ref 0.7–4.0)
MCH: 28.1 pg (ref 26.0–34.0)
MCHC: 32.7 g/dL (ref 30.0–36.0)
MCV: 85.8 fL (ref 80.0–100.0)
Monocytes Absolute: 1.4 10*3/uL — ABNORMAL HIGH (ref 0.1–1.0)
Monocytes Relative: 8 %
Neutro Abs: 14.1 10*3/uL — ABNORMAL HIGH (ref 1.7–7.7)
Neutrophils Relative %: 79 %
Platelets: 475 10*3/uL — ABNORMAL HIGH (ref 150–400)
RBC: 2.53 MIL/uL — ABNORMAL LOW (ref 4.22–5.81)
RDW: 14.6 % (ref 11.5–15.5)
WBC: 17.9 10*3/uL — ABNORMAL HIGH (ref 4.0–10.5)
nRBC: 0 % (ref 0.0–0.2)

## 2022-10-04 LAB — BASIC METABOLIC PANEL
Anion gap: 16 — ABNORMAL HIGH (ref 5–15)
BUN: 49 mg/dL — ABNORMAL HIGH (ref 6–20)
CO2: 19 mmol/L — ABNORMAL LOW (ref 22–32)
Calcium: 8.9 mg/dL (ref 8.9–10.3)
Chloride: 99 mmol/L (ref 98–111)
Creatinine, Ser: 2.33 mg/dL — ABNORMAL HIGH (ref 0.61–1.24)
GFR, Estimated: 31 mL/min — ABNORMAL LOW (ref >60)
Glucose, Bld: 120 mg/dL — ABNORMAL HIGH (ref 70–99)
Potassium: 3.9 mmol/L (ref 3.5–5.1)
Sodium: 134 mmol/L — ABNORMAL LOW (ref 135–145)

## 2022-10-04 LAB — GLUCOSE, CAPILLARY
Glucose-Capillary: 111 mg/dL — ABNORMAL HIGH (ref 70–99)
Glucose-Capillary: 138 mg/dL — ABNORMAL HIGH (ref 70–99)
Glucose-Capillary: 155 mg/dL — ABNORMAL HIGH (ref 70–99)
Glucose-Capillary: 148 mg/dL — ABNORMAL HIGH (ref 70–99)

## 2022-10-04 LAB — MAGNESIUM: Magnesium: 2 mg/dL (ref 1.7–2.4)

## 2022-10-04 LAB — PREPARE RBC (CROSSMATCH)

## 2022-10-04 MED ORDER — CHLORHEXIDINE GLUCONATE 4 % EX LIQD
60.0000 mL | Freq: Once | CUTANEOUS | Status: AC
  Administered 2022-10-05: 4 via TOPICAL
  Filled 2022-10-04: qty 60

## 2022-10-04 MED ORDER — POVIDONE-IODINE 10 % EX SWAB: 2.0000 | Freq: Once | CUTANEOUS | Status: DC

## 2022-10-04 MED ORDER — SODIUM CHLORIDE 0.9% IV SOLUTION: Freq: Once | INTRAVENOUS | Status: AC

## 2022-10-04 MED ORDER — INSULIN ASPART 100 UNIT/ML IJ SOLN
0.0000 [IU] | Freq: Three times a day (TID) | INTRAMUSCULAR | Status: DC
  Administered 2022-10-04: 3 [IU] via SUBCUTANEOUS
  Administered 2022-10-05 – 2022-10-08 (×3): 2 [IU] via SUBCUTANEOUS
  Administered 2022-10-08 – 2022-10-09 (×3): 3 [IU] via SUBCUTANEOUS
  Administered 2022-10-09: 2 [IU] via SUBCUTANEOUS
  Administered 2022-10-10 (×3): 3 [IU] via SUBCUTANEOUS

## 2022-10-04 MED ORDER — CEFAZOLIN IN SODIUM CHLORIDE 3-0.9 GM/100ML-% IV SOLN: 3.0000 g | INTRAVENOUS | Status: DC

## 2022-10-04 NOTE — Progress Notes (Signed)
   10/04/22 1959  Clinical Encounter Type  Visited With Patient  Visit Type Follow-up;Spiritual support;Social support;Pre-op   Chaplain provided follow-up care. Talked with the patient a bit more about life goals, the changes with the amputation, and emotions. Chaplain mentioned Social Work could be a good resource to think through any kind of community support for transportation needs. Chaplain offered support. Spiritual care services available as needed.   Jeri Lager, Chaplain 10/04/22

## 2022-10-04 NOTE — H&P (View-Only) (Signed)
ORTHOPAEDIC CONSULTATION  REQUESTING PHYSICIAN: Debbe Odea, MD  Chief Complaint: Chronic ulcerations right ankle.  HPI: Shane Chambers is a 61 y.o. male who presents with patient states that he has had ulceration around the right ankle since 2001.  He has not been to a wound clinic.  Patient has a history of uncontrolled type 2 diabetes.  Past Medical History:  Diagnosis Date   Anemia    Arthritis    CAD (coronary artery disease)    a. s/p CABG in 03/2020 with LIMA-LAD, RIMA-PL, RA-[id number]-OM1   Cancer ([place])    rectal   Cellulitis    COPD (chronic obstructive pulmonary disease) ([place])    Depression    Diabetes mellitus without complication (HCC)    GERD (gastroesophageal reflux disease)    History of kidney stones    Hyperlipidemia 12/01/2019   Hypertension    Hypothyroidism    Ischemic cardiomyopathy    a. EF 20-25% by echo in 02/2020 b. at 40% by echo in 03/2020 c. EF normalized to 60-65% by echo in 04/2020   Myocardial infarction [facility])    Peripheral vascular disease ([place])    Sleep apnea    Type 2 diabetes mellitus ([place])    Past Surgical History:  Procedure Laterality Date   BIOPSY  07/18/2021   Procedure: BIOPSY;  Surgeon: Eloise Harman, DO;  [facility];  Service: Endoscopy;;   BIOPSY  01/09/2022   Procedure: BIOPSY;  Surgeon: Eloise Harman, DO;  [facility];  Service: Endoscopy;;   COLONOSCOPY WITH PROPOFOL N/A 07/18/2021   Carver: 15 millimeter polyp removed from the sigmoid colon, 5 mm polyp removed from sigmoid colon.  Nonbleeding internal hemorrhoids.  Significant looping of the colon. sigmoid path showed invasive colonic adenocarcinoma involving tubular adenoma (invades to depth of 60m, carcinoma 140mfrom margin, no lymphovascular invasion, no poorly differentiated component.   CORONARY ARTERY BYPASS GRAFT N/A 03/13/2020   Procedure: CORONARY ARTERY BYPASS GRAFTING (CABG) times five using bilateral Internal mammary arteries and  left radial artery;  Surgeon: AtWonda OldsMD;  [facility] Service: [facility];  Laterality: N/A;   DENTAL SURGERY     ESOPHAGOGASTRODUODENOSCOPY (EGD) WITH PROPOFOL N/A 07/18/2021   Carver: 1 gastric polyp status post biopsy, gastritis. gastric bx with slight chronic inflammation and no H.pyori. GEJ polypectomy with mild inflammation only   FLEXIBLE SIGMOIDOSCOPY N/A 08/26/2021   Carver: Nonbleeding internal hemorrhoids.  15 mm ulcers from previous polypectomy found in the rectum.  No evidence of previous polyp.  Located 5 to 8 cm from anal verge.   FLEXIBLE SIGMOIDOSCOPY N/A 01/09/2022   Procedure: FLEXIBLE SIGMOIDOSCOPY;  Surgeon: CaEloise HarmanDO;  [facility];  Service: Endoscopy;  Laterality: N/A;   POLYPECTOMY  07/18/2021   Procedure: POLYPECTOMY INTESTINAL;  Surgeon: CaEloise HarmanDO;  [facility];  Service: Endoscopy;;   RADIAL ARTERY HARVEST Left 03/13/2020   Procedure: RAHays  Surgeon: AtWonda OldsMD;  [place] [facility]: [facility] Surgery;  Laterality: Left;   RIGHT/LEFT HEART CATH AND CORONARY ANGIOGRAPHY N/A 03/07/2020   Procedure: RIGHT/LEFT HEART CATH AND CORONARY ANGIOGRAPHY;  Surgeon: CoSherren MochaMD;  [facility];  Service: Cardiovascular;  Laterality: N/A;   SUBMUCOSAL LIFTING INJECTION  01/09/2022   Procedure: SUBMUCOSAL LIFTING INJECTION;  Surgeon: CaEloise HarmanDO;  [facility];  Service: Endoscopy;;   SUBMUCOSAL TATTOO INJECTION  01/09/2022   Procedure: SUBMUCOSAL TATTOO INJECTION;  Surgeon: Eloise Harman, DO;  [facility];  Service: Endoscopy;;   TEE WITHOUT CARDIOVERSION N/A 03/13/2020   Procedure: TRANSESOPHAGEAL ECHOCARDIOGRAM (TEE);  Surgeon: [name], MD;  Location: [place];  Service: Open Heart Surgery;  Laterality: N/A;   Social History   Socioeconomic History   Marital status: Single    Spouse name: Not on file    Number of children: Not on file   Years of education: Not on file   Highest education level: Not on file  Occupational History   Not on file  Tobacco Use   Smoking status: Former    Packs/day: 1.50    Types: Cigarettes    Quit date: 05/25/1995    Years since quitting: 27.3   Smokeless tobacco: Never  Vaping Use   Vaping Use: Never used  Substance and Sexual Activity   Alcohol use: Not Currently    Comment: rarely   Drug use: No   Sexual activity: Not on file  Other Topics Concern   Not on file  Social History Narrative   Not on file   Social Determinants of Health   Financial Resource Strain: Not on file  Food Insecurity: No Food Insecurity (09/26/2022)   Hunger Vital Sign    Worried About Running Out of Food in the Last Year: Never true    Ran Out of Food in the Last Year: Never true  Transportation Needs: No Transportation Needs (09/26/2022)   PRAPARE - Hydrologist (Medical): No    Lack of Transportation (Non-Medical): No  Physical Activity: Not on file  Stress: Not on file  Social Connections: Not on file   Family History  Problem Relation Age of Onset   Hypertension Mother    - negative except otherwise stated in the family history section Allergies  Allergen Reactions   Ace Inhibitors Swelling and Cough    (Not on MAR at [facility])   Other Itching    [name]   Penicillins Itching and Rash    Has patient had a PCN reaction causing immediate rash, facial/tongue/throat swelling, SOB or lightheadedness with hypotension: Unknown Has patient had a PCN reaction causing severe rash involving mucus membranes or skin necrosis: Unknown Has patient had a PCN reaction that required hospitalization: Unknown Has patient had a PCN reaction occurring within the last 10 years: Unknown If all of the above answers are "NO", then may proceed with Cephalosporin use.    Prior to Admission medications   Medication  Sig Start Date End Date Taking? Authorizing Provider  acetaminophen (TYLENOL) 500 MG tablet Take 1,000 mg by mouth every 8 (eight) hours as needed for fever or moderate pain.   Yes [provider]  albuterol (VENTOLIN HFA) 108 (90 Base) MCG/ACT inhaler Inhale 2 puffs into the lungs every 6 (six) hours as needed for wheezing or shortness of breath. 05/02/22  Yes Johnson, Clanford L, MD  amitriptyline (ELAVIL) 75 MG tablet Take 75 mg by mouth at bedtime. 08/29/22  Yes [provider]  aspirin 81 MG EC tablet Take 1 tablet (81 mg total) by mouth daily with breakfast. 11/29/20  Yes Emokpae, Courage, MD  atorvastatin (LIPITOR) 80 MG tablet Take 80 mg by mouth at bedtime.   Yes [provider]  budesonide-formoterol (SYMBICORT) 160-4.5 MCG/ACT inhaler Inhale 2 puffs into the lungs in the morning and at bedtime. 05/24/20  Yes Kara Mead  V, MD  Cholecalciferol (VITAMIN D) 50 MCG (2000 UT) tablet Take 2,000 Units by mouth daily.   Yes [provider]  docusate sodium (COLACE) 100 MG capsule Take 100 mg by mouth daily. 09/23/22  Yes [provider]  doxycycline (VIBRA-TABS) 100 MG tablet Take 1 tablet (100 mg total) by mouth 2 (two) times daily for 9 days. 10/01/22 10/10/22 Yes Ghimire, Dante Gang, MD  FERROUS GLUCONATE PO Take 325 mg by mouth daily. 09/23/22  Yes [provider]  gabapentin (NEURONTIN) 300 MG capsule Take 2 capsules (600 mg total) by mouth 2 (two) times daily. 05/02/22  Yes Johnson, Clanford L, MD  insulin aspart protamine- aspart (NOVOLOG MIX 70/30) (70-30) 100 UNIT/ML injection Inject 0.08 mLs (8 Units total) into the skin 2 (two) times daily with a meal. 05/02/22  Yes Johnson, Clanford L, MD  Lactobacillus (ACIDOPHILUS PO) Take 2 capsules by mouth 2 (two) times daily. 09/24/22 10/08/22 Yes [provider]  levothyroxine (SYNTHROID) 75 MCG tablet Take 75 mcg by mouth daily before breakfast.   Yes [provider]  loperamide HCl  (IMODIUM) 2 MG/15ML solution Take 4 mg by mouth every 6 (six) hours as needed for diarrhea or loose stools.   Yes [provider]  losartan (COZAAR) 50 MG tablet Take 50 mg by mouth daily.   Yes [provider]  magnesium oxide (MAG-OX) 400 MG tablet Take 400 mg by mouth daily.   Yes [provider]  metFORMIN (GLUCOPHAGE) 1000 MG tablet Take 1,000 mg by mouth 2 (two) times daily.   Yes [provider]  metoprolol succinate (TOPROL-XL) 25 MG 24 hr tablet Take 0.5 tablets (12.5 mg total) by mouth 2 (two) times daily. 08/21/20  Yes Verta Ellen., NP  Multiple Vitamin (MULTIVITAMIN) tablet Take 1 tablet by mouth daily.   Yes [provider]  omeprazole (PRILOSEC) 40 MG capsule Take 40 mg by mouth daily. 01/01/22  Yes [provider]  oxyCODONE (OXY IR/ROXICODONE) 5 MG immediate release tablet Take 1 tablet (5 mg total) by mouth every 6 (six) hours as needed for up to 3 days for moderate pain or severe pain. 10/01/22 10/04/22 Yes Ghimire, Dante Gang, MD  polyethylene glycol (MIRALAX / GLYCOLAX) 17 g packet Take 17 g by mouth daily as needed for moderate constipation.   Yes [provider]  polyvinyl alcohol (LIQUIFILM TEARS) 1.4 % ophthalmic solution Place 1 drop into both eyes daily.   Yes [provider]  potassium chloride SA (KLOR-CON M) 20 MEQ tablet Take 1 tablet (20 mEq total) by mouth daily. 05/05/22  Yes Johnson, Clanford L, MD  tamsulosin (FLOMAX) 0.4 MG CAPS capsule Take 0.8 mg by mouth daily.   Yes [provider]  torsemide (DEMADEX) 20 MG tablet Take 3 tablets (60 mg total) by mouth daily. 05/03/22  Yes Johnson, Clanford L, MD  venlafaxine XR (EFFEXOR-XR) 75 MG 24 hr capsule Take 225 mg by mouth daily. 01/21/22  Yes [provider]  Amino Acids-Protein Hydrolys (PRO-STAT AWC PO) Take 30 mLs by mouth 2 (two) times daily.    [provider]  cefadroxil (DURICEF) 500 MG capsule Take 1 capsule (500 mg  total) by mouth 2 (two) times daily for 9 days. 10/01/22 10/10/22  Barb Merino, MD   MR TIBIA FIBULA RIGHT W WO CONTRAST  Result Date: 10/03/2022 CLINICAL DATA:  Soft tissue infection suspected, lower leg, xray done EXAM: MRI OF LOWER RIGHT EXTREMITY WITHOUT AND WITH CONTRAST TECHNIQUE: Multiplanar, multisequence MR imaging  of the right lower extremity was performed both before and after administration of intravenous contrast. CONTRAST:  45m GADAVIST GADOBUTROL 1 MMOL/ML IV SOLN COMPARISON:  CT 10/02/2022 FINDINGS: Bones/Joint/Cartilage There is periarticular marrow edema and low T1 signal in the distal tibia, talus, and lateral malleolus with associated marrow enhancement. There is a tibiotalar joint effusion with a lobular appearance and mildly thickened synovial enhancement. 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 Ligaments The interosseous membrane is unremarkable. Muscles and Tendons There is tenosynovitis of the posterior tibial tendon and peroneal tendons above and below the medial malleolus, favored to be reactive. No acute tendon tear. There is generalized muscle atrophy of the right lower extremity. Soft tissues There is skin thickening and subcutaneous soft tissue swelling of the lower extremity with blister like skin lesions noted. IMPRESSION: Findings are suspicious for right ankle septic arthritis with periarticular osteomyelitis. Moderate size tibiotalar joint effusion. Diffuse lower extremity soft tissue swelling, as can be seen in cellulitis or lymphedema. Tenosynovitis of the posterior tibial tendon and peroneal tendons, favored to be reactive. Electronically Signed   By: JMaurine SimmeringM.D.   On: 10/03/2022 11:30   CT EXTREMITY LOWER RIGHT W CONTRAST  Result Date: 10/02/2022 CLINICAL DATA:  Soft tissue swelling of the right lower extremity. EXAM: CT OF THE LOWER RIGHT EXTREMITY WITH CONTRAST TECHNIQUE: Multidetector CT imaging of the lower right  extremity was performed according to the standard protocol following intravenous contrast administration. RADIATION DOSE REDUCTION: This exam was performed according to the departmental dose-optimization program which includes automated exposure control, adjustment of the mA and/or kV according to patient size and/or use of iterative reconstruction technique. CONTRAST:  635mOMNIPAQUE IOHEXOL 300 MG/ML  SOLN COMPARISON:  Radiographs, same date. FINDINGS: Diffuse skin thickening and marked subcutaneous soft tissue swelling/edema/fluid consistent with severe cellulitis. Suspect scattered skin blisters. There are complex fluid collections surrounding the medial and aspects of the ankle. Medially the fluid collection measures approximately the 4.2 x 3.6 x 2.1 cm. This could represent an abscess or septic tenosynovitis. It is also adjacent to a small erosive lesion involving the superficial aspect of the medial malleolus. Could not exclude osteomyelitis. Laterally there is a smaller fluid collection anterior to the fibula measuring 18 mm on image 221/9. More inferiorly below the lateral malleolus there is a second complex fluid collection which measures a maximum 2.7 cm. This could be an abscess or septic tenosynovitis associated with the peroneal tendons. There is also an ankle joint effusion but no obvious findings for septic arthritis. Erosive changes along the deep cortex of the fibula at the level of the tibiofibular syndesmosis. Indeterminate finding. Significant fatty atrophy of the calf musculature. Suspect myofasciitis without definite pyomyositis. Advanced vascular calcifications. IMPRESSION: 1. Severe cellulitis and probable myofasciitis without definite pyomyositis. 2. Multiple complex fluid collections surrounding the medial and lateral aspects of the ankle as described above. These are suspicious for abscesses or septic tenosynovitis. 3. Erosive changes along the deep cortex of the fibula at the level of  the tibiofibular syndesmosis. Indeterminate finding. 4. Erosive changes along the superficial cortex of the medial malleolus suspicious for osteomyelitis. 5. Small ankle joint effusion but no obvious findings for septic arthritis. 6. MRI without and with contrast (if possible) is recommended for further evaluation of this complex process. Electronically Signed   By: P.Marijo Sanes.D.   On: 10/02/2022 19:40   DG Tibia/Fibula Right  Result Date: 10/02/2022 CLINICAL DATA:  Right leg pain swelling and blisters. Rule out  soft tissue gas. EXAM: RIGHT TIBIA AND FIBULA - 2 VIEW COMPARISON:  None Available. FINDINGS: Cortical margins of the tibia and fibula are intact. No fracture, erosion, or bony destruction. Degenerative change of the knee and to a lesser extent ankle. Diffuse subcutaneous edema. Prominent vascular calcifications. There also multiple soft tissue calcifications. No soft tissue gas or radiopaque foreign body. IMPRESSION: 1. Diffuse subcutaneous edema. No soft tissue gas or radiopaque foreign body. 2. No evidence of osteomyelitis. 3. Prominent vascular calcifications. Multiple small rounded soft tissue calcifications, nonspecific. Electronically Signed   By: Keith Rake M.D.   On: 10/02/2022 17:45   - pertinent xrays, CT, MRI studies were reviewed and independently interpreted  Positive ROS: All other systems have been reviewed and were otherwise negative with the exception of those mentioned in the HPI and as above.  Physical Exam: General: Alert, no acute distress Psychiatric: Patient is competent for consent with normal mood and affect Lymphatic: No axillary or cervical lymphadenopathy Cardiovascular: No pedal edema Respiratory: No cyanosis, no use of accessory musculature GI: No organomegaly, abdomen is soft and non-tender    Images:  '@ENCIMAGES'$ @  Labs:  Lab Results  Component Value Date   HGBA1C 5.6 09/26/2022   HGBA1C 11.1 (H) 11/23/2020   HGBA1C 7.3 (H) 08/01/2020    ESRSEDRATE >140 (H) 10/02/2022   CRP 33.7 (H) 10/02/2022   CRP 33.6 (H) 04/28/2022   CRP 36.5 (H) 04/26/2022   REPTSTATUS 10/02/2022 FINAL 09/27/2022   GRAMSTAIN  08/01/2020    CYTOSPIN SMEAR NO ORGANISMS SEEN WBC PRESENT,BOTH PMN AND MONONUCLEAR Performed at [facility], [address] [place], [place] [address]    CULT  09/27/2022    NO GROWTH 5 DAYS Performed at [facility], [address] [place], [place] [address]    LABORGA KLEBSIELLA PNEUMONIAE (A) 10/19/2021    Lab Results  Component Value Date   ALBUMIN 2.0 (L) 10/03/2022   ALBUMIN 2.2 (L) 10/02/2022   ALBUMIN 3.3 (L) 09/25/2022        Latest Ref Rng & Units 10/04/2022    2:31 AM 10/03/2022    5:19 AM 10/02/2022    5:20 PM  CBC EXTENDED  WBC 4.0 - 10.5 K/uL 17.9  17.8  18.6   RBC 4.22 - 5.81 MIL/uL 2.53  2.72  2.73   Hemoglobin 13.0 - 17.0 g/dL 7.1  7.5  7.7   HCT 39.0 - 52.0 % 21.7  23.9  24.0   Platelets 150 - 400 K/uL 475  464  440   NEUT# 1.7 - 7.7 K/uL 14.1  13.9  14.2   Lymph# 0.7 - 4.0 K/uL 1.3  1.2  1.4     Neurologic: Patient does not have protective sensation bilateral lower extremities.   MUSCULOSKELETAL:   Skin: Examination patient has ulcerative blister and swelling around the right ankle.  I cannot palpate a pulse secondary to the swelling.  The CT scan and MRI scan are reviewed.  The MRI scan shows a septic right ankle with involvement of the entire talus as well as involvement of the distal tibia with an abscess from the joint extending into the soft tissue.  Patient's white cell count of 17.9 hemoglobin 7.1.  Absolute neutrophil count 14.1 with a lymphocyte of 1.3.  Albumin 2.0.  Previous hemoglobin A1c 11.1.  CRP greater than 140.  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  Plan: Plan for a right transtibial amputation tomorrow.  Risk and benefits were discussed  including risk of the wound not healing need for additional  surgery.  Will also type and cross.  Thank you for the consult and the opportunity to see Mr. Thaine Garriga, [place] [phone] 11:24 AM

## 2022-10-04 NOTE — Progress Notes (Signed)
[facility] Progress Note  Patient: Shane Chambers     IRJ:[id number]  DOA: 10/02/2022   PCP: Caprice Renshaw, MD       Brief hospital course: This is a 61 year old male with diabetes mellitus, coronary artery disease status post CABG, COPD, hypertension, coronary artery disease, peripheral vascular disease who presents to the hospital with right leg pain and swelling. The patient was hospitalized from 11/16 through 11/22 for cellulitis of the right leg, treated with ceftriaxone and vancomycin and discharged with doxycycline and cefadroxil. Blood cultures grew Bacillus species which was felt to be a contaminant.   Subjective:  No new complaints  Assessment and Plan: Principal Problem:   Cellulitis-septic arthritis - WBC noted to be 18.6 - Imaging with CT scan and MRI suggestive of right ankle septic arthritis with osteomyelitis.  -The patient has been evaluated by orthopedic surgery and the plan is for a right transtibial amputation - Continue vancomycin and cefepime  Active Problems:   Hypertension   Type 2 diabetes mellitus ([place]) -Home medications include 70/30 and Glucophage -NovoLog sliding scale ordered -Hemoglobin A1C    Component Value Date/Time   HGBA1C 5.6 09/26/2022 0404   Hypothyroidism - Continue levothyroxine  Chronic diastolic heart failure/hypertension - Last echo from 2021 revealed grade 2 diastolic dysfunction with an EF of 55 to 60% - Diuretics currently on hold due to AKI -Continue metoprolol  AKI - Baseline creatinine is about 1.4 - Creatinine is 2.33 today -Continue to hold losartan and torsemide  History of coronary artery disease status post CABG - Continue aspirin and Lipitor  BPH - Continue Flomax  Normocytic anemia-anemia of chronic disease - Hemoglobin is 7-8 -Continue to follow and transfuse if persistently below 7.0 - anemia panel was obtained on 11/20-ferritin was 913 iron saturation was low as was iron binding      Code  Status: Full Code Consultants: Orthopedic surgery Level of Care: Level of care: Telemetry Medical Total time on patient care: 35 DVT prophylaxis:  heparin injection 5,000 Units Start: 10/03/22 0600 SCDs Start: 10/03/22 0127     Objective:   Vitals:   10/04/22 0400 10/04/22 0600 10/04/22 0739 10/04/22 0800  BP: (!) 146/66   (!) 144/68  Pulse: 69 66  68  Resp:  19  (!) 26  Temp:      TempSrc:      SpO2: 90% 100% 100% 99%   There were no vitals filed for this visit. Exam: General exam: Appears comfortable  HEENT: oral mucosa moist Respiratory system: Clear to auscultation.  Cardiovascular system: S1 & S2 heard  Gastrointestinal system: Abdomen soft, non-tender, nondistended. Normal bowel sounds   Extremities: No cyanosis, clubbing  - edema and erythema of right foot leg is present Psychiatry:  Mood & affect appropriate.      CBC: Recent Labs  Lab 09/30/22 0358 10/01/22 0411 10/02/22 1720 10/03/22 0519 10/04/22 0231  WBC 16.1* 14.7* 18.6* 17.8* 17.9*  NEUTROABS  --   --  14.2* 13.9* 14.1*  HGB 7.6* 7.6* 7.7* 7.5* 7.1*  HCT 23.8* 23.6* 24.0* 23.9* 21.7*  MCV 87.8 87.1 87.9 87.9 85.8  PLT 356 418* 440* 464* 301*   Basic Metabolic Panel: Recent Labs  Lab 09/30/22 0358 10/01/22 0411 10/02/22 1720 10/03/22 0519 10/04/22 0231  NA 138 137 134* 136 134*  K 3.7 3.3* 4.1 3.8 3.9  CL 104 105 102 102 99  CO2 24 24 21* 22 19*  GLUCOSE 140* 124* 112* 91 120*  BUN 35* 32* 44*  45* 49*  CREATININE 1.49* 1.44* 1.89* 1.82* 2.33*  CALCIUM 8.6* 8.5* 8.5* 8.6* 8.9  MG  --   --   --  1.5* 2.0   GFR: Estimated Creatinine Clearance: 43.3 mL/min (A) (by C-G formula based on SCr of 2.33 mg/dL (H)).  Scheduled Meds:  amitriptyline  75 mg Oral QHS   aspirin EC  81 mg Oral Q breakfast   atorvastatin  80 mg Oral QHS   gabapentin  600 mg Oral BID   heparin  5,000 Units Subcutaneous Q8H   levothyroxine  75 mcg Oral QAC breakfast   metoprolol succinate  12.5 mg Oral BID    mometasone-formoterol  2 puff Inhalation BID   tamsulosin  0.8 mg Oral Daily   venlafaxine XR  225 mg Oral Daily   Continuous Infusions:  sodium chloride Stopped (10/03/22 2054)   ceFEPime (MAXIPIME) IV 2 g (10/04/22 0521)   vancomycin 124.9 mL/hr at 10/03/22 2156   Imaging and lab data was personally reviewed MR TIBIA FIBULA RIGHT W WO CONTRAST  Result Date: 10/03/2022 CLINICAL DATA:  Soft tissue infection suspected, lower leg, xray done EXAM: MRI OF LOWER RIGHT EXTREMITY WITHOUT AND WITH CONTRAST TECHNIQUE: Multiplanar, multisequence MR imaging of the right lower extremity was performed both before and after administration of intravenous contrast. CONTRAST:  34m GADAVIST GADOBUTROL 1 MMOL/ML IV SOLN COMPARISON:  CT 10/02/2022 FINDINGS: Bones/Joint/Cartilage There is periarticular marrow edema and low T1 signal in the distal tibia, talus, and lateral malleolus with associated marrow enhancement. There is a tibiotalar joint effusion with a lobular appearance and mildly thickened synovial enhancement. 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 Ligaments The interosseous membrane is unremarkable. Muscles and Tendons There is tenosynovitis of the posterior tibial tendon and peroneal tendons above and below the medial malleolus, favored to be reactive. No acute tendon tear. There is generalized muscle atrophy of the right lower extremity. Soft tissues There is skin thickening and subcutaneous soft tissue swelling of the lower extremity with blister like skin lesions noted. IMPRESSION: Findings are suspicious for right ankle septic arthritis with periarticular osteomyelitis. Moderate size tibiotalar joint effusion. Diffuse lower extremity soft tissue swelling, as can be seen in cellulitis or lymphedema. Tenosynovitis of the posterior tibial tendon and peroneal tendons, favored to be reactive. Electronically Signed   By: JMaurine SimmeringM.D.   On: 10/03/2022 11:30   CT EXTREMITY  LOWER RIGHT W CONTRAST  Result Date: 10/02/2022 CLINICAL DATA:  Soft tissue swelling of the right lower extremity. EXAM: CT OF THE LOWER RIGHT EXTREMITY WITH CONTRAST TECHNIQUE: Multidetector CT imaging of the lower right extremity was performed according to the standard protocol following intravenous contrast administration. RADIATION DOSE REDUCTION: This exam was performed according to the departmental dose-optimization program which includes automated exposure control, adjustment of the mA and/or kV according to patient size and/or use of iterative reconstruction technique. CONTRAST:  65mOMNIPAQUE IOHEXOL 300 MG/ML  SOLN COMPARISON:  Radiographs, same date. FINDINGS: Diffuse skin thickening and marked subcutaneous soft tissue swelling/edema/fluid consistent with severe cellulitis. Suspect scattered skin blisters. There are complex fluid collections surrounding the medial and aspects of the ankle. Medially the fluid collection measures approximately the 4.2 x 3.6 x 2.1 cm. This could represent an abscess or septic tenosynovitis. It is also adjacent to a small erosive lesion involving the superficial aspect of the medial malleolus. Could not exclude osteomyelitis. Laterally there is a smaller fluid collection anterior to the fibula measuring 18 mm on image 221/9. More inferiorly  below the lateral malleolus there is a second complex fluid collection which measures a maximum 2.7 cm. This could be an abscess or septic tenosynovitis associated with the peroneal tendons. There is also an ankle joint effusion but no obvious findings for septic arthritis. Erosive changes along the deep cortex of the fibula at the level of the tibiofibular syndesmosis. Indeterminate finding. Significant fatty atrophy of the calf musculature. Suspect myofasciitis without definite pyomyositis. Advanced vascular calcifications. IMPRESSION: 1. Severe cellulitis and probable myofasciitis without definite pyomyositis. 2. Multiple complex  fluid collections surrounding the medial and lateral aspects of the ankle as described above. These are suspicious for abscesses or septic tenosynovitis. 3. Erosive changes along the deep cortex of the fibula at the level of the tibiofibular syndesmosis. Indeterminate finding. 4. Erosive changes along the superficial cortex of the medial malleolus suspicious for osteomyelitis. 5. Small ankle joint effusion but no obvious findings for septic arthritis. 6. MRI without and with contrast (if possible) is recommended for further evaluation of this complex process. Electronically Signed   By: Marijo Sanes M.D.   On: 10/02/2022 19:40   DG Tibia/Fibula Right  Result Date: 10/02/2022 CLINICAL DATA:  Right leg pain swelling and blisters. Rule out soft tissue gas. EXAM: RIGHT TIBIA AND FIBULA - 2 VIEW COMPARISON:  None Available. FINDINGS: Cortical margins of the tibia and fibula are intact. No fracture, erosion, or bony destruction. Degenerative change of the knee and to a lesser extent ankle. Diffuse subcutaneous edema. Prominent vascular calcifications. There also multiple soft tissue calcifications. No soft tissue gas or radiopaque foreign body. IMPRESSION: 1. Diffuse subcutaneous edema. No soft tissue gas or radiopaque foreign body. 2. No evidence of osteomyelitis. 3. Prominent vascular calcifications. Multiple small rounded soft tissue calcifications, nonspecific. Electronically Signed   By: Keith Rake M.D.   On: 10/02/2022 17:45    LOS: 2 days   Author: Eunice Blase Franky Reier  10/04/2022 11:27 AM  To contact [facility]>   Check the care team in [facility] and look for the attending/consulting [place] provider listed  Log into [email or web address] and use [place]'s universal password   Go to> "[facility]"  and find provider  If you still have difficulty reaching the provider, please page the [facility] (Director on Call) for the Hospitalists listed on amion

## 2022-10-04 NOTE — Consult Note (Signed)
ORTHOPAEDIC CONSULTATION  REQUESTING PHYSICIAN: Debbe Odea, MD  Chief Complaint: Chronic ulcerations right ankle.  HPI: Shane Chambers is a 61 y.o. male who presents with patient states that he has had ulceration around the right ankle since 2001.  He has not been to a wound clinic.  Patient has a history of uncontrolled type 2 diabetes.  Past Medical History:  Diagnosis Date   Anemia    Arthritis    CAD (coronary artery disease)    a. s/p CABG in 03/2020 with LIMA-LAD, RIMA-PL, RA-[id number]-OM1   Cancer ([place])    rectal   Cellulitis    COPD (chronic obstructive pulmonary disease) ([place])    Depression    Diabetes mellitus without complication (HCC)    GERD (gastroesophageal reflux disease)    History of kidney stones    Hyperlipidemia 12/01/2019   Hypertension    Hypothyroidism    Ischemic cardiomyopathy    a. EF 20-25% by echo in 02/2020 b. at 40% by echo in 03/2020 c. EF normalized to 60-65% by echo in 04/2020   Myocardial infarction [facility])    Peripheral vascular disease ([place])    Sleep apnea    Type 2 diabetes mellitus ([place])    Past Surgical History:  Procedure Laterality Date   BIOPSY  07/18/2021   Procedure: BIOPSY;  Surgeon: Eloise Harman, DO;  [facility];  Service: Endoscopy;;   BIOPSY  01/09/2022   Procedure: BIOPSY;  Surgeon: Eloise Harman, DO;  [facility];  Service: Endoscopy;;   COLONOSCOPY WITH PROPOFOL N/A 07/18/2021   Carver: 15 millimeter polyp removed from the sigmoid colon, 5 mm polyp removed from sigmoid colon.  Nonbleeding internal hemorrhoids.  Significant looping of the colon. sigmoid path showed invasive colonic adenocarcinoma involving tubular adenoma (invades to depth of 22m, carcinoma 142mfrom margin, no lymphovascular invasion, no poorly differentiated component.   CORONARY ARTERY BYPASS GRAFT N/A 03/13/2020   Procedure: CORONARY ARTERY BYPASS GRAFTING (CABG) times five using bilateral Internal mammary arteries and  left radial artery;  Surgeon: AtWonda OldsMD;  [place] Service: Open Heart Surgery;  Laterality: N/A;   DENTAL SURGERY     ESOPHAGOGASTRODUODENOSCOPY (EGD) WITH PROPOFOL N/A 07/18/2021   Carver: 1 gastric polyp status post biopsy, gastritis. gastric bx with slight chronic inflammation and no H.pyori. GEJ polypectomy with mild inflammation only   FLEXIBLE SIGMOIDOSCOPY N/A 08/26/2021   Carver: Nonbleeding internal hemorrhoids.  15 mm ulcers from previous polypectomy found in the rectum.  No evidence of previous polyp.  Located 5 to 8 cm from anal verge.   FLEXIBLE SIGMOIDOSCOPY N/A 01/09/2022   Procedure: FLEXIBLE SIGMOIDOSCOPY;  Surgeon: CaEloise HarmanDO;  [facility];  Service: Endoscopy;  Laterality: N/A;   POLYPECTOMY  07/18/2021   Procedure: POLYPECTOMY INTESTINAL;  Surgeon: CaEloise HarmanDO;  [facility];  Service: Endoscopy;;   RADIAL ARTERY HARVEST Left 03/13/2020   Procedure: RAN[place]  Surgeon: AtWonda OldsMD;  [place] Service: Open Heart Surgery;  Laterality: Left;   RIGHT/LEFT HEART CATH AND CORONARY ANGIOGRAPHY N/A 03/07/2020   Procedure: RIGHT/LEFT HEART CATH AND CORONARY ANGIOGRAPHY;  Surgeon: CoSherren MochaMD;  [facility];  Service: Cardiovascular;  Laterality: N/A;   SUBMUCOSAL LIFTING INJECTION  01/09/2022   Procedure: SUBMUCOSAL LIFTING INJECTION;  Surgeon: CaEloise HarmanDO;  [facility];  Service: Endoscopy;;   SUBMUCOSAL TATTOO INJECTION  01/09/2022   Procedure: SUBMUCOSAL TATTOO INJECTION;  Surgeon: Eloise Harman, DO;  [facility];  Service: Endoscopy;;   TEE WITHOUT CARDIOVERSION N/A 03/13/2020   Procedure: TRANSESOPHAGEAL ECHOCARDIOGRAM (TEE);  Surgeon: [name], MD;  Location: [place];  Service: Open Heart Surgery;  Laterality: N/A;   Social History   Socioeconomic History   Marital status: Single    Spouse name: Not on file    Number of children: Not on file   Years of education: Not on file   Highest education level: Not on file  Occupational History   Not on file  Tobacco Use   Smoking status: Former    Packs/day: 1.50    Types: Cigarettes    Quit date: 05/25/1995    Years since quitting: 27.3   Smokeless tobacco: Never  Vaping Use   Vaping Use: Never used  Substance and Sexual Activity   Alcohol use: Not Currently    Comment: rarely   Drug use: No   Sexual activity: Not on file  Other Topics Concern   Not on file  Social History Narrative   Not on file   Social Determinants of Health   Financial Resource Strain: Not on file  Food Insecurity: No Food Insecurity (09/26/2022)   Hunger Vital Sign    Worried About Running Out of Food in the Last Year: Never true    Ran Out of Food in the Last Year: Never true  Transportation Needs: No Transportation Needs (09/26/2022)   PRAPARE - Hydrologist (Medical): No    Lack of Transportation (Non-Medical): No  Physical Activity: Not on file  Stress: Not on file  Social Connections: Not on file   Family History  Problem Relation Age of Onset   Hypertension Mother    - negative except otherwise stated in the family history section Allergies  Allergen Reactions   Ace Inhibitors Swelling and Cough    (Not on MAR at [facility])   Other Itching    [name]   Penicillins Itching and Rash    Has patient had a PCN reaction causing immediate rash, facial/tongue/throat swelling, SOB or lightheadedness with hypotension: Unknown Has patient had a PCN reaction causing severe rash involving mucus membranes or skin necrosis: Unknown Has patient had a PCN reaction that required hospitalization: Unknown Has patient had a PCN reaction occurring within the last 10 years: Unknown If all of the above answers are "NO", then may proceed with Cephalosporin use.    Prior to Admission medications   Medication  Sig Start Date End Date Taking? Authorizing Provider  acetaminophen (TYLENOL) 500 MG tablet Take 1,000 mg by mouth every 8 (eight) hours as needed for fever or moderate pain.   Yes [provider]  albuterol (VENTOLIN HFA) 108 (90 Base) MCG/ACT inhaler Inhale 2 puffs into the lungs every 6 (six) hours as needed for wheezing or shortness of breath. 05/02/22  Yes Johnson, Clanford L, MD  amitriptyline (ELAVIL) 75 MG tablet Take 75 mg by mouth at bedtime. 08/29/22  Yes [provider]  aspirin 81 MG EC tablet Take 1 tablet (81 mg total) by mouth daily with breakfast. 11/29/20  Yes Emokpae, Courage, MD  atorvastatin (LIPITOR) 80 MG tablet Take 80 mg by mouth at bedtime.   Yes [provider]  budesonide-formoterol (SYMBICORT) 160-4.5 MCG/ACT inhaler Inhale 2 puffs into the lungs in the morning and at bedtime. 05/24/20  Yes Kara Mead  V, MD  Cholecalciferol (VITAMIN D) 50 MCG (2000 UT) tablet Take 2,000 Units by mouth daily.   Yes [provider]  docusate sodium (COLACE) 100 MG capsule Take 100 mg by mouth daily. 09/23/22  Yes [provider]  doxycycline (VIBRA-TABS) 100 MG tablet Take 1 tablet (100 mg total) by mouth 2 (two) times daily for 9 days. 10/01/22 10/10/22 Yes Ghimire, Dante Gang, MD  FERROUS GLUCONATE PO Take 325 mg by mouth daily. 09/23/22  Yes [provider]  gabapentin (NEURONTIN) 300 MG capsule Take 2 capsules (600 mg total) by mouth 2 (two) times daily. 05/02/22  Yes Johnson, Clanford L, MD  insulin aspart protamine- aspart (NOVOLOG MIX 70/30) (70-30) 100 UNIT/ML injection Inject 0.08 mLs (8 Units total) into the skin 2 (two) times daily with a meal. 05/02/22  Yes Johnson, Clanford L, MD  Lactobacillus (ACIDOPHILUS PO) Take 2 capsules by mouth 2 (two) times daily. 09/24/22 10/08/22 Yes [provider]  levothyroxine (SYNTHROID) 75 MCG tablet Take 75 mcg by mouth daily before breakfast.   Yes [provider]  loperamide HCl  (IMODIUM) 2 MG/15ML solution Take 4 mg by mouth every 6 (six) hours as needed for diarrhea or loose stools.   Yes [provider]  losartan (COZAAR) 50 MG tablet Take 50 mg by mouth daily.   Yes [provider]  magnesium oxide (MAG-OX) 400 MG tablet Take 400 mg by mouth daily.   Yes [provider]  metFORMIN (GLUCOPHAGE) 1000 MG tablet Take 1,000 mg by mouth 2 (two) times daily.   Yes [provider]  metoprolol succinate (TOPROL-XL) 25 MG 24 hr tablet Take 0.5 tablets (12.5 mg total) by mouth 2 (two) times daily. 08/21/20  Yes Verta Ellen., NP  Multiple Vitamin (MULTIVITAMIN) tablet Take 1 tablet by mouth daily.   Yes [provider]  omeprazole (PRILOSEC) 40 MG capsule Take 40 mg by mouth daily. 01/01/22  Yes [provider]  oxyCODONE (OXY IR/ROXICODONE) 5 MG immediate release tablet Take 1 tablet (5 mg total) by mouth every 6 (six) hours as needed for up to 3 days for moderate pain or severe pain. 10/01/22 10/04/22 Yes Ghimire, Dante Gang, MD  polyethylene glycol (MIRALAX / GLYCOLAX) 17 g packet Take 17 g by mouth daily as needed for moderate constipation.   Yes [provider]  polyvinyl alcohol (LIQUIFILM TEARS) 1.4 % ophthalmic solution Place 1 drop into both eyes daily.   Yes [provider]  potassium chloride SA (KLOR-CON M) 20 MEQ tablet Take 1 tablet (20 mEq total) by mouth daily. 05/05/22  Yes Johnson, Clanford L, MD  tamsulosin (FLOMAX) 0.4 MG CAPS capsule Take 0.8 mg by mouth daily.   Yes [provider]  torsemide (DEMADEX) 20 MG tablet Take 3 tablets (60 mg total) by mouth daily. 05/03/22  Yes Johnson, Clanford L, MD  venlafaxine XR (EFFEXOR-XR) 75 MG 24 hr capsule Take 225 mg by mouth daily. 01/21/22  Yes [provider]  Amino Acids-Protein Hydrolys (PRO-STAT AWC PO) Take 30 mLs by mouth 2 (two) times daily.    [provider]  cefadroxil (DURICEF) 500 MG capsule Take 1 capsule (500 mg  total) by mouth 2 (two) times daily for 9 days. 10/01/22 10/10/22  Barb Merino, MD   MR TIBIA FIBULA RIGHT W WO CONTRAST  Result Date: 10/03/2022 CLINICAL DATA:  Soft tissue infection suspected, lower leg, xray done EXAM: MRI OF LOWER RIGHT EXTREMITY WITHOUT AND WITH CONTRAST TECHNIQUE: Multiplanar, multisequence MR imaging  of the right lower extremity was performed both before and after administration of intravenous contrast. CONTRAST:  58m GADAVIST GADOBUTROL 1 MMOL/ML IV SOLN COMPARISON:  CT 10/02/2022 FINDINGS: Bones/Joint/Cartilage There is periarticular marrow edema and low T1 signal in the distal tibia, talus, and lateral malleolus with associated marrow enhancement. There is a tibiotalar joint effusion with a lobular appearance and mildly thickened synovial enhancement. 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 Ligaments The interosseous membrane is unremarkable. Muscles and Tendons There is tenosynovitis of the posterior tibial tendon and peroneal tendons above and below the medial malleolus, favored to be reactive. No acute tendon tear. There is generalized muscle atrophy of the right lower extremity. Soft tissues There is skin thickening and subcutaneous soft tissue swelling of the lower extremity with blister like skin lesions noted. IMPRESSION: Findings are suspicious for right ankle septic arthritis with periarticular osteomyelitis. Moderate size tibiotalar joint effusion. Diffuse lower extremity soft tissue swelling, as can be seen in cellulitis or lymphedema. Tenosynovitis of the posterior tibial tendon and peroneal tendons, favored to be reactive. Electronically Signed   By: JMaurine SimmeringM.D.   On: 10/03/2022 11:30   CT EXTREMITY LOWER RIGHT W CONTRAST  Result Date: 10/02/2022 CLINICAL DATA:  Soft tissue swelling of the right lower extremity. EXAM: CT OF THE LOWER RIGHT EXTREMITY WITH CONTRAST TECHNIQUE: Multidetector CT imaging of the lower right  extremity was performed according to the standard protocol following intravenous contrast administration. RADIATION DOSE REDUCTION: This exam was performed according to the departmental dose-optimization program which includes automated exposure control, adjustment of the mA and/or kV according to patient size and/or use of iterative reconstruction technique. CONTRAST:  652mOMNIPAQUE IOHEXOL 300 MG/ML  SOLN COMPARISON:  Radiographs, same date. FINDINGS: Diffuse skin thickening and marked subcutaneous soft tissue swelling/edema/fluid consistent with severe cellulitis. Suspect scattered skin blisters. There are complex fluid collections surrounding the medial and aspects of the ankle. Medially the fluid collection measures approximately the 4.2 x 3.6 x 2.1 cm. This could represent an abscess or septic tenosynovitis. It is also adjacent to a small erosive lesion involving the superficial aspect of the medial malleolus. Could not exclude osteomyelitis. Laterally there is a smaller fluid collection anterior to the fibula measuring 18 mm on image 221/9. More inferiorly below the lateral malleolus there is a second complex fluid collection which measures a maximum 2.7 cm. This could be an abscess or septic tenosynovitis associated with the peroneal tendons. There is also an ankle joint effusion but no obvious findings for septic arthritis. Erosive changes along the deep cortex of the fibula at the level of the tibiofibular syndesmosis. Indeterminate finding. Significant fatty atrophy of the calf musculature. Suspect myofasciitis without definite pyomyositis. Advanced vascular calcifications. IMPRESSION: 1. Severe cellulitis and probable myofasciitis without definite pyomyositis. 2. Multiple complex fluid collections surrounding the medial and lateral aspects of the ankle as described above. These are suspicious for abscesses or septic tenosynovitis. 3. Erosive changes along the deep cortex of the fibula at the level of  the tibiofibular syndesmosis. Indeterminate finding. 4. Erosive changes along the superficial cortex of the medial malleolus suspicious for osteomyelitis. 5. Small ankle joint effusion but no obvious findings for septic arthritis. 6. MRI without and with contrast (if possible) is recommended for further evaluation of this complex process. Electronically Signed   By: P.Marijo Sanes.D.   On: 10/02/2022 19:40   DG Tibia/Fibula Right  Result Date: 10/02/2022 CLINICAL DATA:  Right leg pain swelling and blisters. Rule out  soft tissue gas. EXAM: RIGHT TIBIA AND FIBULA - 2 VIEW COMPARISON:  None Available. FINDINGS: Cortical margins of the tibia and fibula are intact. No fracture, erosion, or bony destruction. Degenerative change of the knee and to a lesser extent ankle. Diffuse subcutaneous edema. Prominent vascular calcifications. There also multiple soft tissue calcifications. No soft tissue gas or radiopaque foreign body. IMPRESSION: 1. Diffuse subcutaneous edema. No soft tissue gas or radiopaque foreign body. 2. No evidence of osteomyelitis. 3. Prominent vascular calcifications. Multiple small rounded soft tissue calcifications, nonspecific. Electronically Signed   By: Keith Rake M.D.   On: 10/02/2022 17:45   - pertinent xrays, CT, MRI studies were reviewed and independently interpreted  Positive ROS: All other systems have been reviewed and were otherwise negative with the exception of those mentioned in the HPI and as above.  Physical Exam: General: Alert, no acute distress Psychiatric: Patient is competent for consent with normal mood and affect Lymphatic: No axillary or cervical lymphadenopathy Cardiovascular: No pedal edema Respiratory: No cyanosis, no use of accessory musculature GI: No organomegaly, abdomen is soft and non-tender    Images:  '@ENCIMAGES'$ @  Labs:  Lab Results  Component Value Date   HGBA1C 5.6 09/26/2022   HGBA1C 11.1 (H) 11/23/2020   HGBA1C 7.3 (H) 08/01/2020    ESRSEDRATE >140 (H) 10/02/2022   CRP 33.7 (H) 10/02/2022   CRP 33.6 (H) 04/28/2022   CRP 36.5 (H) 04/26/2022   REPTSTATUS 10/02/2022 FINAL 09/27/2022   GRAMSTAIN  08/01/2020    CYTOSPIN SMEAR NO ORGANISMS SEEN WBC PRESENT,BOTH PMN AND MONONUCLEAR Performed at [facility], [address], [place], [place] [address]    CULT  09/27/2022    NO GROWTH 5 DAYS Performed at [facility], [address], [place], [place] [address]    LABORGA KLEBSIELLA PNEUMONIAE (A) 10/19/2021    Lab Results  Component Value Date   ALBUMIN 2.0 (L) 10/03/2022   ALBUMIN 2.2 (L) 10/02/2022   ALBUMIN 3.3 (L) 09/25/2022        Latest Ref Rng & Units 10/04/2022    2:31 AM 10/03/2022    5:19 AM 10/02/2022    5:20 PM  CBC EXTENDED  WBC 4.0 - 10.5 K/uL 17.9  17.8  18.6   RBC 4.22 - 5.81 MIL/uL 2.53  2.72  2.73   Hemoglobin 13.0 - 17.0 g/dL 7.1  7.5  7.7   HCT 39.0 - 52.0 % 21.7  23.9  24.0   Platelets 150 - 400 K/uL 475  464  440   NEUT# 1.7 - 7.7 K/uL 14.1  13.9  14.2   Lymph# 0.7 - 4.0 K/uL 1.3  1.2  1.4     Neurologic: Patient does not have protective sensation bilateral lower extremities.   MUSCULOSKELETAL:   Skin: Examination patient has ulcerative blister and swelling around the right ankle.  I cannot palpate a pulse secondary to the swelling.  The CT scan and MRI scan are reviewed.  The MRI scan shows a septic right ankle with involvement of the entire talus as well as involvement of the distal tibia with an abscess from the joint extending into the soft tissue.  Patient's white cell count of 17.9 hemoglobin 7.1.  Absolute neutrophil count 14.1 with a lymphocyte of 1.3.  Albumin 2.0.  Previous hemoglobin A1c 11.1.  CRP greater than 140.  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  Plan: Plan for a right transtibial amputation tomorrow.  Risk and benefits were discussed  including risk of the wound not healing need for additional  surgery.  Will also type and cross.  Thank you for the consult and the opportunity to see Shane Chambers, [place] [phone] 11:24 AM

## 2022-10-04 NOTE — Progress Notes (Signed)
   10/04/22 1325  Clinical Encounter Type  Visited With Patient;Health care provider  Visit Type Initial;Spiritual support;Pre-op  Referral From Patient;Nurse   Chaplain responded to visit with a patient who is going through an amputation this upcoming week. Patient has been through a lot and has some spiritual uncertainties and challenges. Chaplain offered listening and gave some support. Chaplain offered prayer and support.Chaplain will seek to follow-up. Chaplain introduced spiritual care services. Spiritual care services available as needed.   Jeri Lager, Chaplain 10/04/22

## 2022-10-05 ENCOUNTER — Encounter (HOSPITAL_COMMUNITY): Payer: Self-pay | Admitting: Family Medicine

## 2022-10-05 ENCOUNTER — Inpatient Hospital Stay (HOSPITAL_COMMUNITY): Payer: Medicare Other | Admitting: Certified Registered Nurse Anesthetist

## 2022-10-05 ENCOUNTER — Inpatient Hospital Stay (HOSPITAL_COMMUNITY): Payer: Medicare Other

## 2022-10-05 ENCOUNTER — Encounter (HOSPITAL_COMMUNITY)
Admission: EM | Disposition: A | Discharge status: Skilled Nursing Facility | Payer: Self-pay | Source: Skilled Nursing Facility | Attending: Internal Medicine

## 2022-10-05 ENCOUNTER — Other Ambulatory Visit: Payer: Self-pay

## 2022-10-05 DIAGNOSIS — J449 Chronic obstructive pulmonary disease, unspecified: Secondary | ICD-10-CM

## 2022-10-05 DIAGNOSIS — N179 Acute kidney failure, unspecified: Secondary | ICD-10-CM | POA: Diagnosis not present

## 2022-10-05 DIAGNOSIS — M86271 Subacute osteomyelitis, right ankle and foot: Secondary | ICD-10-CM | POA: Diagnosis not present

## 2022-10-05 DIAGNOSIS — E1169 Type 2 diabetes mellitus with other specified complication: Secondary | ICD-10-CM

## 2022-10-05 DIAGNOSIS — M869 Osteomyelitis, unspecified: Secondary | ICD-10-CM

## 2022-10-05 DIAGNOSIS — Z7984 Long term (current) use of oral hypoglycemic drugs: Secondary | ICD-10-CM

## 2022-10-05 DIAGNOSIS — Z87891 Personal history of nicotine dependence: Secondary | ICD-10-CM

## 2022-10-05 DIAGNOSIS — M009 Pyogenic arthritis, unspecified: Secondary | ICD-10-CM | POA: Diagnosis not present

## 2022-10-05 DIAGNOSIS — Z794 Long term (current) use of insulin: Secondary | ICD-10-CM

## 2022-10-05 HISTORY — PX: APPLICATION OF WOUND VAC: SHX5189

## 2022-10-05 HISTORY — PX: AMPUTATION: SHX166

## 2022-10-05 LAB — BASIC METABOLIC PANEL
Anion gap: 13 (ref 5–15)
BUN: 56 mg/dL — ABNORMAL HIGH (ref 6–20)
CO2: 19 mmol/L — ABNORMAL LOW (ref 22–32)
Calcium: 8.8 mg/dL — ABNORMAL LOW (ref 8.9–10.3)
Chloride: 100 mmol/L (ref 98–111)
Creatinine, Ser: 3.12 mg/dL — ABNORMAL HIGH (ref 0.61–1.24)
GFR, Estimated: 22 mL/min — ABNORMAL LOW (ref >60)
Glucose, Bld: 114 mg/dL — ABNORMAL HIGH (ref 70–99)
Potassium: 4.2 mmol/L (ref 3.5–5.1)
Sodium: 132 mmol/L — ABNORMAL LOW (ref 135–145)

## 2022-10-05 LAB — GLUCOSE, CAPILLARY
Glucose-Capillary: 105 mg/dL — ABNORMAL HIGH (ref 70–99)
Glucose-Capillary: 115 mg/dL — ABNORMAL HIGH (ref 70–99)
Glucose-Capillary: 108 mg/dL — ABNORMAL HIGH (ref 70–99)
Glucose-Capillary: 112 mg/dL — ABNORMAL HIGH (ref 70–99)
Glucose-Capillary: 112 mg/dL — ABNORMAL HIGH (ref 70–99)
Glucose-Capillary: 135 mg/dL — ABNORMAL HIGH (ref 70–99)
Glucose-Capillary: 152 mg/dL — ABNORMAL HIGH (ref 70–99)

## 2022-10-05 LAB — VANCOMYCIN, RANDOM: Vancomycin Rm: 33 ug/mL

## 2022-10-05 LAB — CBC WITH DIFFERENTIAL/PLATELET
Abs Immature Granulocytes: 0.68 10*3/uL — ABNORMAL HIGH (ref 0.00–0.07)
Basophils Absolute: 0.1 10*3/uL (ref 0.0–0.1)
Basophils Relative: 0 %
Eosinophils Absolute: 0.5 10*3/uL (ref 0.0–0.5)
Eosinophils Relative: 3 %
HCT: 23.7 % — ABNORMAL LOW (ref 39.0–52.0)
Hemoglobin: 7.4 g/dL — ABNORMAL LOW (ref 13.0–17.0)
Immature Granulocytes: 4 %
Lymphocytes Relative: 7 %
Lymphs Abs: 1.2 10*3/uL (ref 0.7–4.0)
MCH: 27.8 pg (ref 26.0–34.0)
MCHC: 31.2 g/dL (ref 30.0–36.0)
MCV: 89.1 fL (ref 80.0–100.0)
Monocytes Absolute: 1.5 10*3/uL — ABNORMAL HIGH (ref 0.1–1.0)
Monocytes Relative: 8 %
Neutro Abs: 13.5 10*3/uL — ABNORMAL HIGH (ref 1.7–7.7)
Neutrophils Relative %: 78 %
Platelets: 489 10*3/uL — ABNORMAL HIGH (ref 150–400)
RBC: 2.66 MIL/uL — ABNORMAL LOW (ref 4.22–5.81)
RDW: 14.6 % (ref 11.5–15.5)
WBC: 17.5 10*3/uL — ABNORMAL HIGH (ref 4.0–10.5)
nRBC: 0 % (ref 0.0–0.2)

## 2022-10-05 LAB — SURGICAL PCR SCREEN
MRSA, PCR: POSITIVE — AB
Staphylococcus aureus: POSITIVE — AB

## 2022-10-05 SURGERY — AMPUTATION BELOW KNEE
Anesthesia: General | Site: Leg Lower | Laterality: Right

## 2022-10-05 MED ORDER — OXYCODONE HCL 5 MG/5ML PO SOLN: 5.0000 mg | Freq: Once | ORAL | Status: DC | PRN

## 2022-10-05 MED ORDER — VANCOMYCIN VARIABLE DOSE PER UNSTABLE RENAL FUNCTION (PHARMACIST DOSING)
Status: DC
  Filled 2022-10-05: qty 1

## 2022-10-05 MED ORDER — PROPOFOL 10 MG/ML IV BOLUS
INTRAVENOUS | Status: DC | PRN
  Administered 2022-10-05: 100 mg via INTRAVENOUS

## 2022-10-05 MED ORDER — LIDOCAINE 2% (20 MG/ML) 5 ML SYRINGE
INTRAMUSCULAR | Status: DC | PRN
  Administered 2022-10-05: 60 mg via INTRAVENOUS

## 2022-10-05 MED ORDER — ACETAMINOPHEN 10 MG/ML IV SOLN
1000.0000 mg | Freq: Once | INTRAVENOUS | Status: DC | PRN
  Administered 2022-10-05: 1000 mg via INTRAVENOUS

## 2022-10-05 MED ORDER — MUPIROCIN 2 % EX OINT: 1.0000 | TOPICAL_OINTMENT | Freq: Once | CUTANEOUS | Status: AC

## 2022-10-05 MED ORDER — ALBUMIN HUMAN 25 % IV SOLN
25.0000 g | Freq: Four times a day (QID) | INTRAVENOUS | Status: AC
  Administered 2022-10-05 (×2): 25 g via INTRAVENOUS
  Filled 2022-10-05 (×2): qty 100

## 2022-10-05 MED ORDER — FENTANYL CITRATE (PF) 100 MCG/2ML IJ SOLN: 25.0000 ug | INTRAMUSCULAR | Status: DC | PRN

## 2022-10-05 MED ORDER — ORAL CARE MOUTH RINSE: 15.0000 mL | Freq: Once | OROMUCOSAL | Status: AC

## 2022-10-05 MED ORDER — FENTANYL CITRATE (PF) 250 MCG/5ML IJ SOLN
INTRAMUSCULAR | Status: AC
  Filled 2022-10-05: qty 5

## 2022-10-05 MED ORDER — GABAPENTIN 300 MG PO CAPS
300.0000 mg | ORAL_CAPSULE | Freq: Two times a day (BID) | ORAL | Status: DC
  Administered 2022-10-05 – 2022-10-06 (×2): 300 mg via ORAL
  Filled 2022-10-05 (×2): qty 1

## 2022-10-05 MED ORDER — INSULIN ASPART 100 UNIT/ML IJ SOLN: 0.0000 [IU] | INTRAMUSCULAR | Status: DC | PRN

## 2022-10-05 MED ORDER — HYDROMORPHONE HCL 1 MG/ML IJ SOLN
0.5000 mg | Freq: Once | INTRAMUSCULAR | Status: AC
  Administered 2022-10-05: 0.5 mg via INTRAVENOUS

## 2022-10-05 MED ORDER — FENTANYL CITRATE (PF) 100 MCG/2ML IJ SOLN
25.0000 ug | INTRAMUSCULAR | Status: DC | PRN
  Administered 2022-10-05 (×2): 50 ug via INTRAVENOUS

## 2022-10-05 MED ORDER — PROMETHAZINE HCL 25 MG/ML IJ SOLN: 6.2500 mg | INTRAMUSCULAR | Status: DC | PRN

## 2022-10-05 MED ORDER — 0.9 % SODIUM CHLORIDE (POUR BTL) OPTIME
TOPICAL | Status: DC | PRN
  Administered 2022-10-05: 1000 mL

## 2022-10-05 MED ORDER — OXYCODONE HCL 5 MG PO TABS: 5.0000 mg | ORAL_TABLET | Freq: Once | ORAL | Status: DC | PRN

## 2022-10-05 MED ORDER — ONDANSETRON HCL 4 MG/2ML IJ SOLN
INTRAMUSCULAR | Status: DC | PRN
  Administered 2022-10-05: 4 mg via INTRAVENOUS

## 2022-10-05 MED ORDER — MUPIROCIN 2 % EX OINT
TOPICAL_OINTMENT | CUTANEOUS | Status: AC
  Administered 2022-10-05: 1 via TOPICAL
  Filled 2022-10-05: qty 22

## 2022-10-05 MED ORDER — CEFAZOLIN IN SODIUM CHLORIDE 3-0.9 GM/100ML-% IV SOLN
INTRAVENOUS | Status: AC
  Filled 2022-10-05: qty 100

## 2022-10-05 MED ORDER — CHLORHEXIDINE GLUCONATE 0.12 % MT SOLN
OROMUCOSAL | Status: AC
  Administered 2022-10-05: 15 mL via OROMUCOSAL
  Filled 2022-10-05: qty 15

## 2022-10-05 MED ORDER — HYDROMORPHONE HCL 1 MG/ML IJ SOLN
1.0000 mg | INTRAMUSCULAR | Status: DC | PRN
  Administered 2022-10-05 – 2022-10-08 (×11): 1 mg via INTRAVENOUS
  Filled 2022-10-05 (×11): qty 1

## 2022-10-05 MED ORDER — ACETAMINOPHEN 10 MG/ML IV SOLN: 1000.0000 mg | Freq: Once | INTRAVENOUS | Status: DC | PRN

## 2022-10-05 MED ORDER — DEXAMETHASONE SODIUM PHOSPHATE 10 MG/ML IJ SOLN
INTRAMUSCULAR | Status: DC | PRN
  Administered 2022-10-05: 5 mg via INTRAVENOUS

## 2022-10-05 MED ORDER — EPHEDRINE SULFATE-NACL 50-0.9 MG/10ML-% IV SOSY
PREFILLED_SYRINGE | INTRAVENOUS | Status: DC | PRN
  Administered 2022-10-05: 10 mg via INTRAVENOUS

## 2022-10-05 MED ORDER — FENTANYL CITRATE (PF) 100 MCG/2ML IJ SOLN
INTRAMUSCULAR | Status: AC
  Filled 2022-10-05: qty 2

## 2022-10-05 MED ORDER — CHLORHEXIDINE GLUCONATE 0.12 % MT SOLN: 15.0000 mL | Freq: Once | OROMUCOSAL | Status: AC

## 2022-10-05 MED ORDER — FENTANYL CITRATE (PF) 100 MCG/2ML IJ SOLN
INTRAMUSCULAR | Status: DC | PRN
  Administered 2022-10-05: 100 ug via INTRAVENOUS

## 2022-10-05 MED ORDER — ROCURONIUM BROMIDE 10 MG/ML (PF) SYRINGE
PREFILLED_SYRINGE | INTRAVENOUS | Status: DC | PRN
  Administered 2022-10-05: 30 mg via INTRAVENOUS

## 2022-10-05 MED ORDER — MIDAZOLAM HCL 2 MG/2ML IJ SOLN
INTRAMUSCULAR | Status: AC
  Filled 2022-10-05: qty 2

## 2022-10-05 MED ORDER — LIDOCAINE 2% (20 MG/ML) 5 ML SYRINGE
INTRAMUSCULAR | Status: AC
  Filled 2022-10-05: qty 5

## 2022-10-05 MED ORDER — SODIUM CHLORIDE 0.9 % IV SOLN: INTRAVENOUS | Status: DC

## 2022-10-05 MED ORDER — SUGAMMADEX SODIUM 200 MG/2ML IV SOLN
INTRAVENOUS | Status: DC | PRN
  Administered 2022-10-05: 200 mg via INTRAVENOUS

## 2022-10-05 SURGICAL SUPPLY — 38 items
BLADE SAW RECIP 87.9 MT (BLADE) ×3 IMPLANT
BLADE SURG 21 STRL SS (BLADE) ×3 IMPLANT
BNDG COHESIVE 6X5 TAN STRL LF (GAUZE/BANDAGES/DRESSINGS) IMPLANT
CANISTER WOUND CARE 500ML ATS (WOUND CARE) ×3 IMPLANT
COVER SURGICAL LIGHT HANDLE (MISCELLANEOUS) ×3 IMPLANT
CUFF TOURN SGL QUICK 34 (TOURNIQUET CUFF) ×2
CUFF TRNQT CYL 34X4.125X (TOURNIQUET CUFF) ×3 IMPLANT
DRAPE DERMATAC (DRAPES) IMPLANT
DRAPE INCISE IOBAN 66X45 STRL (DRAPES) ×3 IMPLANT
DRAPE U-SHAPE 47X51 STRL (DRAPES) ×3 IMPLANT
DRESSING PREVENA PLUS CUSTOM (GAUZE/BANDAGES/DRESSINGS) ×3 IMPLANT
DRSG PREVENA PLUS CUSTOM (GAUZE/BANDAGES/DRESSINGS) ×2
DURAPREP 26ML APPLICATOR (WOUND CARE) ×3 IMPLANT
ELECT REM PT RETURN 9FT ADLT (ELECTROSURGICAL) ×2
ELECTRODE REM PT RTRN 9FT ADLT (ELECTROSURGICAL) ×3 IMPLANT
GLOVE BIOGEL PI IND STRL 9 (GLOVE) ×3 IMPLANT
GLOVE SURG ORTHO 9.0 STRL STRW (GLOVE) ×3 IMPLANT
GOWN STRL REUS W/ TWL XL LVL3 (GOWN DISPOSABLE) ×6 IMPLANT
GOWN STRL REUS W/TWL XL LVL3 (GOWN DISPOSABLE) ×4
GRAFT SKIN WND MICRO 38 (Tissue) IMPLANT
GRAFT SKIN WND OMEGA3 SB 7X10 (Tissue) IMPLANT
KIT BASIN OR (CUSTOM PROCEDURE TRAY) ×3 IMPLANT
KIT TURNOVER KIT B (KITS) ×3 IMPLANT
MANIFOLD NEPTUNE II (INSTRUMENTS) ×3 IMPLANT
NS IRRIG 1000ML POUR BTL (IV SOLUTION) ×3 IMPLANT
PACK ORTHO EXTREMITY (CUSTOM PROCEDURE TRAY) ×3 IMPLANT
PAD ARMBOARD 7.5X6 YLW CONV (MISCELLANEOUS) ×3 IMPLANT
PREVENA RESTOR ARTHOFORM 46X30 (CANNISTER) ×3 IMPLANT
SPONGE T-LAP 18X18 ~~LOC~~+RFID (SPONGE) IMPLANT
STAPLER VISISTAT 35W (STAPLE) IMPLANT
STOCKINETTE IMPERVIOUS LG (DRAPES) ×3 IMPLANT
SUT ETHILON 2 0 PSLX (SUTURE) IMPLANT
SUT SILK 2 0 (SUTURE) ×2
SUT SILK 2-0 18XBRD TIE 12 (SUTURE) ×3 IMPLANT
SUT VIC AB 1 CTX 27 (SUTURE) ×6 IMPLANT
TOWEL GREEN STERILE (TOWEL DISPOSABLE) ×3 IMPLANT
TUBE CONNECTING 12X1/4 (SUCTIONS) ×3 IMPLANT
YANKAUER SUCT BULB TIP NO VENT (SUCTIONS) ×3 IMPLANT

## 2022-10-05 NOTE — Progress Notes (Signed)
[facility] Progress Note  Patient: Shane Chambers     RXV:[id number]  DOA: 10/02/2022   PCP: Caprice Renshaw, MD       Brief hospital course: This is a 61 year old male with diabetes mellitus, coronary artery disease status post CABG, COPD, hypertension, coronary artery disease, peripheral vascular disease who presents to the hospital with right leg pain and swelling. The patient was hospitalized from 11/16 through 11/22 for cellulitis of the right leg, treated with ceftriaxone and vancomycin and discharged with doxycycline and cefadroxil. Blood cultures grew Bacillus species which was felt to be a contaminant.   Subjective:  No new complaints  Assessment and Plan: Principal Problem:   Cellulitis-septic arthritis - WBC noted to be 18.6 - Imaging with CT scan and MRI suggestive of right ankle septic arthritis with osteomyelitis.  - Continue vancomycin and cefepime - s/p AKA today  Active Problems:   AKI - Baseline creatinine is about 0.9-1.4 - Creatinine is steadily rising -Continue to hold losartan and torsemide - he states he is voiding without issues - have asked for nephrology consult    Type 2 diabetes mellitus ([place]) -Home medications include 70/30 and Glucophage -NovoLog sliding scale ordered -Hemoglobin A1C    Component Value Date/Time   HGBA1C 5.6 09/26/2022 0404   Hypothyroidism - Continue levothyroxine  Chronic diastolic heart failure/hypertension - Last echo from 2021 revealed grade 2 diastolic dysfunction with an EF of 55 to 60% - Diuretics currently on hold due to AKI -Continue metoprolol  History of coronary artery disease status post CABG - Continue aspirin and Lipitor  BPH - Continue Flomax  Normocytic anemia-anemia of chronic disease - Hemoglobin is 7-8 -Continue to follow and transfuse if persistently below 7.0 - anemia panel was obtained on 11/20-ferritin was 913 iron saturation was low as was iron binding      Code Status: Full  Code Consultants: Orthopedic surgery Level of Care: Level of care: Telemetry Medical Total time on patient care: 35 DVT prophylaxis:  heparin injection 5,000 Units Start: 10/03/22 0600 SCDs Start: 10/03/22 0127     Objective:   Vitals:   10/05/22 1045 10/05/22 1230 10/05/22 1245 10/05/22 1300  BP: (!) 152/80 121/63 116/68 136/71  Pulse: 73 73 74 71  Resp: 18 18 (!) 23 11  Temp:  98.3 F (36.8 C)  98 F (36.7 C)  TempSrc:      SpO2: 95% 92% 94% 94%  Weight:      Height:       Filed Weights   10/05/22 1026  Weight: 118 kg   Exam: General exam: Appears comfortable  HEENT: oral mucosa moist Respiratory system: Clear to auscultation.  Cardiovascular system: S1 & S2 heard  Gastrointestinal system: Abdomen soft, non-tender, nondistended. Normal bowel sounds   Extremities: No cyanosis, clubbing or edema- left AKA Psychiatry:  Mood & affect appropriate.       CBC: Recent Labs  Lab 10/01/22 0411 10/02/22 1720 10/03/22 0519 10/04/22 0231 10/05/22 0816  WBC 14.7* 18.6* 17.8* 17.9* 17.5*  NEUTROABS  --  14.2* 13.9* 14.1* 13.5*  HGB 7.6* 7.7* 7.5* 7.1* 7.4*  HCT 23.6* 24.0* 23.9* 21.7* 23.7*  MCV 87.1 87.9 87.9 85.8 89.1  PLT 418* 440* 464* 475* 489*    Basic Metabolic Panel: Recent Labs  Lab 10/01/22 0411 10/02/22 1720 10/03/22 0519 10/04/22 0231 10/05/22 0816  NA 137 134* 136 134* 132*  K 3.3* 4.1 3.8 3.9 4.2  CL 105 102 102 99 100  CO2 24 21* 22  19* 19*  GLUCOSE 124* 112* 91 120* 114*  BUN 32* 44* 45* 49* 56*  CREATININE 1.44* 1.89* 1.82* 2.33* 3.12*  CALCIUM 8.5* 8.5* 8.6* 8.9 8.8*  MG  --   --  1.5* 2.0  --     GFR: Estimated Creatinine Clearance: 32.3 mL/min (A) (by C-G formula based on SCr of 3.12 mg/dL (H)).  Scheduled Meds:  amitriptyline  75 mg Oral QHS   aspirin EC  81 mg Oral Q breakfast   atorvastatin  80 mg Oral QHS   fentaNYL       gabapentin  600 mg Oral BID   heparin  5,000 Units Subcutaneous Q8H   insulin aspart  0-15 Units  Subcutaneous TID WC   levothyroxine  75 mcg Oral QAC breakfast   metoprolol succinate  12.5 mg Oral BID   mometasone-formoterol  2 puff Inhalation BID   tamsulosin  0.8 mg Oral Daily   vancomycin variable dose per unstable renal function (pharmacist dosing)   Does not apply See admin instructions   venlafaxine XR  225 mg Oral Daily   Continuous Infusions:  ceFAZolin     ceFEPime (MAXIPIME) IV 2 g (10/05/22 0502)   Imaging and lab data was personally reviewed No results found.  LOS: 3 days   Author: Debbe Odea  10/05/2022 3:17 PM  To contact [facility]>   Check the care team in [facility] and look for the attending/consulting [place] provider listed  Log into [email or web address] and use [place]'s universal password   Go to> "[facility]"  and find provider  If you still have difficulty reaching the provider, please page the [facility] (Director on Call) for the Hospitalists listed on amion

## 2022-10-05 NOTE — Consult Note (Signed)
Nephrology Consult   Requesting provider: Debbe Odea Service requesting consult: Hospitalist Reason for consult: AKI on CKD 3b   Assessment/Recommendations: Shane Chambers is a/an 61 y.o. male with a past medical history DM2, CAD, CABG, COPD, HTN, PVD who presents with osteomyelitis c/b AKI  Non-Oliguric AKI on CKD 3b: Rising since admission.  No obvious hypotension.  Could be related to vancomycin.  No vancomycin trough has been obtained -Vancomycin was dosed this morning.  Recommend vancomycin level prior to next dosing -Decrease gabapentin to 300 mg twice daily in the setting of AKI -Has some edema but may need volume ultimately.  Will provide albumin 25 g x 2 doses today -Continue to monitor daily Cr, Dose meds for GFR -Monitor Daily I/Os, Daily weight  -Maintain MAP>65 for optimal renal perfusion.  -Avoid nephrotoxic medications including NSAIDs -Use synthetic opioids (Fentanyl/Dilaudid) if needed -Check Renal U/S to rule out obstruction; obtain UA as well -Currently no indication for HD  Osteomyelitis: As well as pyomyositis.  Status post right BKA.  Management per orthopedics and antibiotics per primary team  Hypertension: On the Toprol.  Agree with holding losartan  Hyponatremia: Mild decrease with sodium of 132 in the setting of AKI likely related to free water retention.  Continue to monitor  Metabolic acidosis: Bicarb 19.  Continue to monitor  Uncontrolled Diabetes Mellitus Type 2 with Hyperglycemia: Management per primary  Anemia: Likely related to illness.  Transfusions per primary   Recommendations conveyed to primary service.    [facility] 10/05/2022 3:41 PM   _____________________________________________________________________________________ CC: osteomyelitis  History of Present Illness: Shane Chambers is a/an 61 y.o. male with a past medical history of DM2, CAD, CABG, COPD, HTN, PVD who presents with right lower  extremity pain and swelling.  The patient is in significant pain postoperatively and has a hard time providing history.  History was mainly obtained per chart review.  Patient was hospitalized from 11/16 until 11/22 with cellulitis of the right lower extremity.  He was treated with antibiotics and discharged with oral antibiotics.  The patient presented 2 days ago because he felt like his leg was getting worse and he was having more pain.  He was unable to ambulate easily because of it.  He also had some chills and possibly some fevers.  He does not remember taking any NSAIDs at his facility but he was on losartan.  He denied any chest pain, shortness of breath, nausea, vomiting, diarrhea, dysuria, hematuria.  In the emergency department found to have leukocytosis and CT scan was concerning for pyomyositis and likely osteomyelitis.  Patient was started on antibiotics and orthopedics was consulted.  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  Patient is being treated with vancomycin and cefepime.  No obvious hypotensive episodes.  Baseline creatinine is likely around 1.4-1.8.  Creatinine was 1.8 on 11/24 and increased to 2.3 yesterday and now 3.1.  Patient's albumin is fairly low at 2.  No obvious hypotension with procedure today.   Medications:  Current Facility-Administered Medications  Medication Dose Route Frequency Provider Last Rate Last Admin   acetaminophen (TYLENOL) tablet 650 mg  650 mg Oral Q6H PRN Zierle-Ghosh, Asia B, DO       Or   acetaminophen (TYLENOL) suppository 650 mg  650 mg Rectal Q6H PRN Zierle-Ghosh, Asia B, DO       amitriptyline (ELAVIL) tablet 75 mg  75 mg Oral QHS Zierle-Ghosh, Asia B, DO   75 mg at 10/04/22 2243  aspirin EC tablet 81 mg  81 mg Oral Q breakfast Zierle-Ghosh, Asia B, DO   81 mg at 10/04/22 0834   atorvastatin (LIPITOR) tablet 80 mg  80 mg Oral QHS Zierle-Ghosh, Asia B, DO   80 mg at 10/04/22 2043   ceFAZolin (ANCEF) 3-0.9 GM/100ML-% IVPB            ceFEPIme  (MAXIPIME) 2 g in sodium chloride 0.9 % 100 mL IVPB  2 g Intravenous Q12H Wilson Singer I, RPH 200 mL/hr at 10/05/22 0502 2 g at 10/05/22 0502   fentaNYL (SUBLIMAZE) 100 MCG/2ML injection            gabapentin (NEURONTIN) capsule 300 mg  300 mg Oral BID Reesa Chew, MD       heparin injection 5,000 Units  5,000 Units Subcutaneous Q8H Zierle-Ghosh, Asia B, DO   5,000 Units at 10/05/22 0503   HYDROmorphone (DILAUDID) injection 0.5 mg  0.5 mg Intravenous Q4H PRN Wynetta Emery, Clanford L, MD   0.5 mg at 10/05/22 1500   insulin aspart (novoLOG) injection 0-15 Units  0-15 Units Subcutaneous TID WC Debbe Odea, MD   3 Units at 10/04/22 1825   levothyroxine (SYNTHROID) tablet 75 mcg  75 mcg Oral QAC breakfast Zierle-Ghosh, Asia B, DO   75 mcg at 10/04/22 0522   metoprolol succinate (TOPROL-XL) 24 hr tablet 12.5 mg  12.5 mg Oral BID Zierle-Ghosh, Asia B, DO   12.5 mg at 10/04/22 2043   mometasone-formoterol (DULERA) 200-5 MCG/ACT inhaler 2 puff  2 puff Inhalation BID Zierle-Ghosh, Asia B, DO   2 puff at 10/05/22 0751   ondansetron (ZOFRAN) tablet 4 mg  4 mg Oral Q6H PRN Zierle-Ghosh, Asia B, DO       Or   ondansetron (ZOFRAN) injection 4 mg  4 mg Intravenous Q6H PRN Zierle-Ghosh, Asia B, DO   4 mg at 10/03/22 1202   oxyCODONE (Oxy IR/ROXICODONE) immediate release tablet 5 mg  5 mg Oral Q4H PRN Zierle-Ghosh, Asia B, DO   5 mg at 10/05/22 1459   polyethylene glycol (MIRALAX / GLYCOLAX) packet 17 g  17 g Oral Daily PRN Zierle-Ghosh, Asia B, DO       tamsulosin (FLOMAX) capsule 0.8 mg  0.8 mg Oral Daily Zierle-Ghosh, Asia B, DO   0.8 mg at 10/04/22 0834   vancomycin variable dose per unstable renal function (pharmacist dosing)   Does not apply See admin instructions Gena Fray, RPH       venlafaxine XR (EFFEXOR-XR) 24 hr capsule 225 mg  225 mg Oral Daily Zierle-Ghosh, Asia B, DO   225 mg at 10/04/22 4656     ALLERGIES Ace inhibitors, Other, Shellfish allergy, and Penicillins  MEDICAL  HISTORY Past Medical History:  Diagnosis Date   Anemia    Arthritis    CAD (coronary artery disease)    a. s/p CABG in 03/2020 with LIMA-LAD, RIMA-PL, RA-D1-RI-OM1   Cancer ([place])    rectal   Cellulitis    COPD (chronic obstructive pulmonary disease) ([place])    Depression    Diabetes mellitus without complication ([place])    GERD (gastroesophageal reflux disease)    History of kidney stones    Hyperlipidemia 12/01/2019   Hypertension    Hypothyroidism    Ischemic cardiomyopathy    a. EF 20-25% by echo in 02/2020 b. at 40% by echo in 03/2020 c. EF normalized to 60-65% by echo in 04/2020   Myocardial infarction [facility])    Peripheral vascular disease ([place])  Sleep apnea    Type 2 diabetes mellitus ([place])      SOCIAL HISTORY Social History   Socioeconomic History   Marital status: Single    Spouse name: Not on file   Number of children: Not on file   Years of education: Not on file   Highest education level: Not on file  Occupational History   Not on file  Tobacco Use   Smoking status: Former    Packs/day: 1.50    Types: Cigarettes    Quit date: 05/25/1995    Years since quitting: 27.3   Smokeless tobacco: Never  Vaping Use   Vaping Use: Never used  Substance and Sexual Activity   Alcohol use: Not Currently    Comment: rarely   Drug use: No   Sexual activity: Not on file  Other Topics Concern   Not on file  Social History Narrative   Not on file   Social Determinants of Health   Financial Resource Strain: Not on file  Food Insecurity: No Food Insecurity (09/26/2022)   Hunger Vital Sign    Worried About Running Out of Food in the Last Year: Never true    Ran Out of Food in the Last Year: Never true  Transportation Needs: No Transportation Needs (09/26/2022)   PRAPARE - Hydrologist (Medical): No    Lack of Transportation (Non-Medical): No  Physical Activity: Not on file  Stress: Not on file  Social Connections: Not on file  Intimate  Partner Violence: Not At Risk (09/26/2022)   Humiliation, Afraid, Rape, and Kick questionnaire    Fear of Current or Ex-Partner: No    Emotionally Abused: No    Physically Abused: No    Sexually Abused: No     FAMILY HISTORY Family History  Problem Relation Age of Onset   Hypertension Mother       Review of Systems: 12 systems reviewed Otherwise as per HPI, all other systems reviewed and negative  Physical Exam: Vitals:   10/05/22 1245 10/05/22 1300  BP: 116/68 136/71  Pulse: 74 71  Resp: (!) 23 11  Temp:  98 F (36.7 C)  SpO2: 94% 94%   Total I/O In: -  Out: 200 [Blood:200]  Intake/Output Summary (Last 24 hours) at 10/05/2022 1541 Last data filed at 10/05/2022 1224 Gross per 24 hour  Intake 10 ml  Output 600 ml  Net -590 ml   General: lying in bed, in distress because of pain HEENT: anicteric sclera, oropharynx clear without lesions CV: normal rate, no rub, 1+ edema in the LLE Lungs: clear to auscultation bilaterally, normal work of breathing Abd: soft, non-tender, non-distended Skin: no visible lesions or rashes Psych: alert, engaged, appropriate mood and affect Musculoskeletal: RLE BKA Neuro: normal speech, no gross focal deficits   Test Results Reviewed Lab Results  Component Value Date   NA 132 (L) 10/05/2022   K 4.2 10/05/2022   CL 100 10/05/2022   CO2 19 (L) 10/05/2022   BUN 56 (H) 10/05/2022   CREATININE 3.12 (H) 10/05/2022   CALCIUM 8.8 (L) 10/05/2022   ALBUMIN 2.0 (L) 10/03/2022   PHOS 5.6 (H) 03/06/2020    CBC Recent Labs  Lab 10/03/22 0519 10/04/22 0231 10/05/22 0816  WBC 17.8* 17.9* 17.5*  NEUTROABS 13.9* 14.1* 13.5*  HGB 7.5* 7.1* 7.4*  HCT 23.9* 21.7* 23.7*  MCV 87.9 85.8 89.1  PLT 464* 475* 489*    I have reviewed all relevant outside healthcare records related  to the patient's current hospitalization

## 2022-10-05 NOTE — Anesthesia Postprocedure Evaluation (Signed)
Anesthesia Post Note  Patient: Shane Chambers  Procedure(s) Performed: AMPUTATION BELOW KNEE (Right: Knee) APPLICATION OF WOUND VAC (Right: Leg Lower)     Patient location during evaluation: PACU Anesthesia Type: General Level of consciousness: awake Pain management: pain level controlled Vital Signs Assessment: post-procedure vital signs reviewed and stable Respiratory status: spontaneous breathing, nonlabored ventilation and respiratory function stable Cardiovascular status: blood pressure returned to baseline and stable Postop Assessment: no apparent nausea or vomiting Anesthetic complications: no   No notable events documented.  Last Vitals:  Vitals:   10/05/22 1245 10/05/22 1300  BP: 116/68 136/71  Pulse: 74 71  Resp: (!) 23 11  Temp:  36.7 C  SpO2: 94% 94%    Last Pain:  Vitals:   10/05/22 1300  TempSrc:   PainSc: Asleep                 Johnjoseph Rolfe P Islay Polanco

## 2022-10-05 NOTE — Progress Notes (Addendum)
Pharmacy Antibiotic Note  Shane Chambers is a 61 y.o. male admitted on 10/02/2022 with cellulitis.  Pt reports R lower leg pain, swelling, and blisters. Pt was recently discharged on 11/22 from admission w/ cellulitis and was taking cefadroxil 500 BID + doxycycline 100 BID for outpatient therapy (started 09/29/22). Re-admission for septic arthritis. Pharmacy has been consulted for vancomycin dosing.  Patient has developed an AKI with worsening renal function. Scr 3.12.  Elevated wbc 17.5 but afebrile. Plan to go to OR today with Dr. Sharol Given for right transtibial amputation. With worsening renal function, will dose per unstable renal function since Scr continues to increase.   Addendum  Pt is s/p BKA. Plan for 24 hrs abx post-op per ortho. Due to his AKI, he should have plenty of vanc in his system to cover for at least 24 hrs. We will add stop time to cefepime for tomorrow.   Random vanc level 33 Plan: No further vanc needed Add stop date for cefepime 11/27     Temp (24hrs), Avg:98.1 F (36.7 C), Min:97.9 F (36.6 C), Max:98.6 F (37 C)  Recent Labs  Lab 10/01/22 0411 10/02/22 1720 10/02/22 2007 10/03/22 0519 10/04/22 0231 10/05/22 0816  WBC 14.7* 18.6*  --  17.8* 17.9* 17.5*  CREATININE 1.44* 1.89*  --  1.82* 2.33* 3.12*  LATICACIDVEN  --  1.8 1.2  --   --   --     Estimated Creatinine Clearance: 32.3 mL/min (A) (by C-G formula based on SCr of 3.12 mg/dL (H)).    Allergies  Allergen Reactions   Ace Inhibitors Swelling and Cough    (Not on MAR at [facility])   Other Itching    [name]   Penicillins Itching and Rash    Has patient had a PCN reaction causing immediate rash, facial/tongue/throat swelling, SOB or lightheadedness with hypotension: Unknown Has patient had a PCN reaction causing severe rash involving mucus membranes or skin necrosis: Unknown Has patient had a PCN reaction that required hospitalization: Unknown Has patient had a  PCN reaction occurring within the last 10 years: Unknown If all of the above answers are "NO", then may proceed with Cephalosporin use.     Antimicrobials this admission: Vancomycin 11/23>11/26 Cefepime 11/23>11/27 Clindamycin x1   Dose adjustments this admission:  N/A  Microbiology results:   Onnie Boer, PharmD, BCIDP, AAHIVP, CPP Infectious Disease Pharmacist 10/05/2022 7:14 PM

## 2022-10-05 NOTE — Op Note (Signed)
10/05/2022  12:34 PM  PATIENT:  Shane Chambers    PRE-OPERATIVE DIAGNOSIS:  Osteomyelitis  POST-OPERATIVE DIAGNOSIS:  Same  PROCEDURE:  AMPUTATION BELOW KNEE, APPLICATION OF WOUND VAC Application of Kerecis micro graft 38 cm and Kerecis sheet 7 x 10 cm. Application of Prevena customizable and Prevena arthroform wound VAC dressings Application of Vive Wear stump shrinker and the Hanger limb protector  SURGEON:  Newt Minion, MD  ANESTHESIA:   General  PREOPERATIVE INDICATIONS:  Shane Chambers is a  61 y.o. male with a diagnosis of Osteomyelitis who failed conservative measures and elected for surgical management.    The risks benefits and alternatives were discussed with the patient preoperatively including but not limited to the risks of infection, bleeding, nerve injury, cardiopulmonary complications, the need for revision surgery, among others, and the patient was willing to proceed.  OPERATIVE IMPLANTS: Kerecis micro graft 38 cm and Kerecis sheet 7 x 10 cm.   OPERATIVE FINDINGS: Muscle with ischemic changes.  No signs of abscess or infection from the septic ankle.  OPERATIVE PROCEDURE: Patient was brought to the operating room after undergoing a regional anesthetic.  After adequate levels anesthesia were obtained a thigh tourniquet was placed and the lower extremity was prepped using DuraPrep draped into a sterile field. The foot was draped out of the sterile field with impervious stockinette.  A timeout was called and the tourniquet inflated.  A transverse skin incision was made 12 cm distal to the tibial tubercle, the incision curved proximally, and a large posterior flap was created.  The tibia was transected just proximal to the skin incision and beveled anteriorly.  The fibula was transected just proximal to the tibial incision.  The sciatic nerve was pulled cut and allowed to retract.  The vascular bundles were suture ligated with 2-0 silk.  The tourniquet was deflated and  hemostasis obtained.    Drill holes were placed through the tibia and fibula to secure the [facility] tissue graft and the gastrocnemius fascia.    The Kerecis micro powder 38 cm was applied to the open wound that has a 200 cm surface area.  The 7 x 10 cm Kerecis sheet was then folded and secured to the distal tibia and fibula with #1 Vicryl.  A separate drill hole was then used to secure the [facility] tissue graft and the gastrocnemius fascia to the dorsum of the tibia.    The deep and superficial fascial layers were closed using #1 Vicryl.  The skin was closed using staples.    The Prevena customizable dressing was applied this was overwrapped with the arthroform sponge.  [name] was used to secure the sponges and the circumferential compression was secured to the skin with Dermatac.  This was connected to the wound VAC pump and had a good suction fit this was covered with a stump shrinker and a limb protector.  Patient was taken to the PACU in stable condition.   DISCHARGE PLANNING:  Antibiotic duration: 24-hour antibiotics  Weightbearing: Nonweightbearing on the operative extremity  Pain medication: Opioid pathway  Dressing care/ Wound VAC: Continue wound VAC with the Prevena plus pump at discharge for 1 week  Ambulatory devices: Walker or kneeling scooter  Discharge to: Discharge planning based on recommendations per physical therapy  Follow-up: In the office 1 week after discharge.

## 2022-10-05 NOTE — Anesthesia Procedure Notes (Signed)
Procedure Name: Intubation Date/Time: 10/05/2022 11:37 AM  Performed by: Moshe Salisbury, CRNAPre-anesthesia Checklist: Patient identified, Emergency Drugs available, Suction available and Patient being monitored Patient Re-evaluated:Patient Re-evaluated prior to induction Oxygen Delivery Method: Circle System Utilized Preoxygenation: Pre-oxygenation with 100% oxygen Induction Type: IV induction Ventilation: Mask ventilation without difficulty Laryngoscope Size: Mac and 4 Grade View: Grade II Tube type: Oral Tube size: 8.0 mm Number of attempts: 1 Airway Equipment and Method: Stylet Placement Confirmation: ETT inserted through vocal cords under direct vision, positive ETCO2 and breath sounds checked- equal and bilateral Secured at: 23 cm Tube secured with: Tape Dental Injury: Teeth and Oropharynx as per pre-operative assessment

## 2022-10-05 NOTE — Transfer of Care (Signed)
Immediate Anesthesia Transfer of Care Note  Patient: Shane Chambers  Procedure(s) Performed: AMPUTATION BELOW KNEE (Right: Knee) APPLICATION OF WOUND VAC (Right: Leg Lower)  Patient Location: PACU  Anesthesia Type:General  Level of Consciousness: unresponsive and drowsy  Airway & Oxygen Therapy: Patient Spontanous Breathing and Patient connected to nasal cannula oxygen  Post-op Assessment: Report given to RN, Post -op Vital signs reviewed and stable, and Patient moving all extremities  Post vital signs: Reviewed and stable  Last Vitals:  Vitals Value Taken Time  BP 116/68 10/05/22 1245  Temp    Pulse 75 10/05/22 1246  Resp 17 10/05/22 1246  SpO2 98 % 10/05/22 1246  Vitals shown include unvalidated device data.  Last Pain:  Vitals:   10/05/22 1041  TempSrc: Oral  PainSc:          Complications: No notable events documented.

## 2022-10-05 NOTE — Anesthesia Preprocedure Evaluation (Addendum)
Anesthesia Evaluation  Patient identified by MRN, date of birth, ID band Patient confused    Reviewed: Allergy & Precautions, NPO status , Patient's Chart, lab work & pertinent test results, Unable to perform ROS - Chart review only  Airway Mallampati: III  TM Distance: >3 FB Neck ROM: Full    Dental no notable dental hx.    Pulmonary sleep apnea , COPD,  COPD inhaler, former smoker   Pulmonary exam normal        Cardiovascular hypertension, Pt. on medications and Pt. on home beta blockers + CAD, + Past MI, + CABG, + Peripheral Vascular Disease and +CHF  Normal cardiovascular exam     Neuro/Psych  PSYCHIATRIC DISORDERS  Depression     Neuromuscular disease    GI/Hepatic Neg liver ROS,GERD  Medicated,,  Endo/Other  diabetes, Insulin Dependent, Oral Hypoglycemic AgentsHypothyroidism    Renal/GU CRFRenal disease     Musculoskeletal  (+) Arthritis ,    Abdominal  (+) + obese  Peds  Hematology  (+) Blood dyscrasia, anemia   Anesthesia Other Findings Osteomyelititis Sepsis  Reproductive/Obstetrics                             Anesthesia Physical Anesthesia Plan  ASA: 4 and emergent  Anesthesia Plan: General   Post-op Pain Management:    Induction: Intravenous  PONV Risk Score and Plan: 2 and Ondansetron, Dexamethasone and Treatment may vary due to age or medical condition  Airway Management Planned: Oral ETT  Additional Equipment:   Intra-op Plan:   Post-operative Plan: Extubation in OR  Informed Consent:      Only emergency history available  Plan Discussed with: CRNA and Surgeon  Anesthesia Plan Comments: (Patient confused. Unable to reach next of kin (mother). Case declared an emergency. )       Anesthesia Quick Evaluation

## 2022-10-05 NOTE — Interval H&P Note (Signed)
History and Physical Interval Note:  10/05/2022 7:57 AM  Shane Chambers  has presented today for surgery, with the diagnosis of Osteomyelititis.  The various methods of treatment have been discussed with the patient and family. After consideration of risks, benefits and other options for treatment, the patient has consented to  Procedure(s): AMPUTATION BELOW KNEE (Right) as a surgical intervention.  The patient's history has been reviewed, patient examined, no change in status, stable for surgery.  I have reviewed the patient's chart and labs.  Questions were answered to the patient's satisfaction.     Newt Minion

## 2022-10-06 ENCOUNTER — Encounter (HOSPITAL_COMMUNITY): Payer: Self-pay | Admitting: Orthopedic Surgery

## 2022-10-06 DIAGNOSIS — M009 Pyogenic arthritis, unspecified: Secondary | ICD-10-CM | POA: Diagnosis not present

## 2022-10-06 DIAGNOSIS — N179 Acute kidney failure, unspecified: Secondary | ICD-10-CM | POA: Diagnosis not present

## 2022-10-06 LAB — BASIC METABOLIC PANEL
Anion gap: 14 (ref 5–15)
BUN: 62 mg/dL — ABNORMAL HIGH (ref 6–20)
CO2: 19 mmol/L — ABNORMAL LOW (ref 22–32)
Calcium: 9.1 mg/dL (ref 8.9–10.3)
Chloride: 103 mmol/L (ref 98–111)
Creatinine, Ser: 3.06 mg/dL — ABNORMAL HIGH (ref 0.61–1.24)
GFR, Estimated: 23 mL/min — ABNORMAL LOW (ref >60)
Glucose, Bld: 118 mg/dL — ABNORMAL HIGH (ref 70–99)
Potassium: 5 mmol/L (ref 3.5–5.1)
Sodium: 136 mmol/L (ref 135–145)

## 2022-10-06 LAB — CBC WITH DIFFERENTIAL/PLATELET
Abs Immature Granulocytes: 0.76 10*3/uL — ABNORMAL HIGH (ref 0.00–0.07)
Basophils Absolute: 0.1 10*3/uL (ref 0.0–0.1)
Basophils Relative: 0 %
Eosinophils Absolute: 0.2 10*3/uL (ref 0.0–0.5)
Eosinophils Relative: 1 %
HCT: 21.4 % — ABNORMAL LOW (ref 39.0–52.0)
Hemoglobin: 7 g/dL — ABNORMAL LOW (ref 13.0–17.0)
Immature Granulocytes: 4 %
Lymphocytes Relative: 8 %
Lymphs Abs: 1.4 10*3/uL (ref 0.7–4.0)
MCH: 28.7 pg (ref 26.0–34.0)
MCHC: 32.7 g/dL (ref 30.0–36.0)
MCV: 87.7 fL (ref 80.0–100.0)
Monocytes Absolute: 1.3 10*3/uL — ABNORMAL HIGH (ref 0.1–1.0)
Monocytes Relative: 7 %
Neutro Abs: 13.7 10*3/uL — ABNORMAL HIGH (ref 1.7–7.7)
Neutrophils Relative %: 80 %
Platelets: 473 10*3/uL — ABNORMAL HIGH (ref 150–400)
RBC: 2.44 MIL/uL — ABNORMAL LOW (ref 4.22–5.81)
RDW: 14.6 % (ref 11.5–15.5)
WBC: 17.3 10*3/uL — ABNORMAL HIGH (ref 4.0–10.5)
nRBC: 0 % (ref 0.0–0.2)

## 2022-10-06 LAB — GLUCOSE, CAPILLARY
Glucose-Capillary: 112 mg/dL — ABNORMAL HIGH (ref 70–99)
Glucose-Capillary: 110 mg/dL — ABNORMAL HIGH (ref 70–99)
Glucose-Capillary: 100 mg/dL — ABNORMAL HIGH (ref 70–99)
Glucose-Capillary: 97 mg/dL (ref 70–99)

## 2022-10-06 LAB — PREPARE RBC (CROSSMATCH)

## 2022-10-06 MED ORDER — SODIUM CHLORIDE 0.9% IV SOLUTION: Freq: Once | INTRAVENOUS | Status: AC

## 2022-10-06 MED ORDER — SODIUM CHLORIDE 0.9 % IV SOLN: INTRAVENOUS | Status: AC

## 2022-10-06 MED ORDER — ACETAMINOPHEN 500 MG PO TABS
1000.0000 mg | ORAL_TABLET | Freq: Three times a day (TID) | ORAL | Status: DC
  Filled 2022-10-06: qty 2

## 2022-10-06 MED ORDER — METOPROLOL TARTRATE 5 MG/5ML IV SOLN
5.0000 mg | Freq: Once | INTRAVENOUS | Status: AC
  Administered 2022-10-06: 5 mg via INTRAVENOUS
  Filled 2022-10-06: qty 5

## 2022-10-06 MED ORDER — SODIUM CHLORIDE 0.9 % IV SOLN
250.0000 mg | Freq: Once | INTRAVENOUS | Status: AC
  Administered 2022-10-06: 250 mg via INTRAVENOUS
  Filled 2022-10-06: qty 20

## 2022-10-06 MED ORDER — OXYCODONE HCL 5 MG PO TABS
5.0000 mg | ORAL_TABLET | ORAL | Status: DC
  Administered 2022-10-06 – 2022-10-10 (×3): 5 mg via ORAL
  Filled 2022-10-06 (×5): qty 1

## 2022-10-06 MED ORDER — SODIUM BICARBONATE 650 MG PO TABS
650.0000 mg | ORAL_TABLET | Freq: Two times a day (BID) | ORAL | Status: DC
  Filled 2022-10-06: qty 1

## 2022-10-06 NOTE — Care Management Important Message (Signed)
Important Message  Patient Details  Name: Shane Chambers MRN: [id number] Date of Birth: 20-Sep-1961   Medicare Important Message Given:  Yes Patient has a precaution order in place will mail to the patient home address.    Cosme Jacob 10/06/2022, 2:52 PM

## 2022-10-06 NOTE — Progress Notes (Signed)
TR[facility] night cross cover note:   I was notified by RN that the patient is c/o severe RLE pain less than 2 hours after receiving Dilaudid 0.5 mg IV, as component of existing order for Dilaudid 0.5 mg IV q4h prn. At that time, I ordered Dilaudid 0.5 mg IV x 1 additional dose, which seemed to help with the patient's pain, although pain significantly intensified within 2 hours of receiving this additional dose of Dilaudid. Given this report, I modified both the dose and frequency of his prn Dilaudid, with order updated to Dilaudid 1 mg IV q2h prn.     Babs Bertin, DO Hospitalist

## 2022-10-06 NOTE — Progress Notes (Signed)
[facility] Progress Note  Patient: Shane Chambers     TIW:[id number]  DOA: 10/02/2022   PCP: Caprice Renshaw, MD       Brief hospital course: This is a 61 year old male with diabetes mellitus, coronary artery disease status post CABG, COPD, hypertension, coronary artery disease, peripheral vascular disease who presents to the hospital with right leg pain and swelling. The patient was hospitalized from 11/16 through 11/22 for cellulitis of the right leg, treated with ceftriaxone and vancomycin and discharged with doxycycline and cefadroxil. Blood cultures grew Bacillus species which was felt to be a contaminant.   Subjective:  Sedated this AM. Dilaudid was increased due to pain yesterday.   Assessment and Plan: Principal Problem:   Cellulitis-septic arthritis - WBC noted to be 18.6 - Imaging with CT scan and MRI suggestive of right ankle septic arthritis with osteomyelitis.  - Continue vancomycin and cefepime x 3 days post op per Dr Sharol Given - 11/26- s/p AKA  - having difficulty with pain control- cont Oxycodone and try to limit Dilaudid today due to excess sedation- discussed with RN  Active Problems:   AKI - Baseline creatinine is about 0.9-1.4 - Creatinine has been steadily rising -Continue to hold losartan and torsemide - he states he is voiding without issues - have asked for nephrology consult - renal ultrasound unremarkable - Cr slightly better today- follow    Type 2 diabetes mellitus ([place]) -Home medications include 70/30 and Glucophage -NovoLog sliding scale ordered -Hemoglobin A1C    Component Value Date/Time   HGBA1C 5.6 09/26/2022 0404   Hypothyroidism - Continue levothyroxine  Chronic diastolic heart failure/hypertension - Last echo from 2021 revealed grade 2 diastolic dysfunction with an EF of 55 to 60% - Diuretics currently on hold due to AKI -Continue metoprolol  History of coronary artery disease status post CABG - Continue aspirin and  Lipitor  BPH - Continue Flomax  Normocytic anemia-anemia of chronic disease - Hemoglobin is 7 - transfuse 1 U PRBC today and he has ongoing bloody drainage from wound vac - anemia panel was obtained on 11/20-ferritin was 913 - iron saturation was low as was iron binding - will give him a dose of IV Iron today      Code Status: Full Code Consultants: Orthopedic surgery Level of Care: Level of care: Telemetry Medical Total time on patient care: 35 DVT prophylaxis:  heparin injection 5,000 Units Start: 10/03/22 0600 SCDs Start: 10/03/22 0127     Objective:   Vitals:   10/05/22 2320 10/06/22 0744 10/06/22 0817 10/06/22 1057  BP: (!) 150/81  (!) 143/77 133/65  Pulse: 78  65 68  Resp: '14  12 11  '$ Temp: 98 F (36.7 C)  97.6 F (36.4 C) 97.6 F (36.4 C)  TempSrc: Axillary  Axillary Axillary  SpO2: 96% 99% 100% 99%  Weight:      Height:       Filed Weights   10/05/22 1026  Weight: 118 kg   Exam: General exam: Appears comfortable - very somnolent and moans when tried to be awakened HEENT: oral mucosa moist Respiratory system: Clear to auscultation.  Cardiovascular system: S1 & S2 heard  Gastrointestinal system: Abdomen soft, non-tender, nondistended. Normal bowel sounds   Extremities: No cyanosis, clubbing or edema- right AKA        CBC: Recent Labs  Lab 10/02/22 1720 10/03/22 0519 10/04/22 0231 10/05/22 0816 10/06/22 0641  WBC 18.6* 17.8* 17.9* 17.5* 17.3*  NEUTROABS 14.2* 13.9* 14.1* 13.5* 13.7*  HGB 7.7*  7.5* 7.1* 7.4* 7.0*  HCT 24.0* 23.9* 21.7* 23.7* 21.4*  MCV 87.9 87.9 85.8 89.1 87.7  PLT 440* 464* 475* 489* 473*    Basic Metabolic Panel: Recent Labs  Lab 10/02/22 1720 10/03/22 0519 10/04/22 0231 10/05/22 0816 10/06/22 0641  NA 134* 136 134* 132* 136  K 4.1 3.8 3.9 4.2 5.0  CL 102 102 99 100 103  CO2 21* 22 19* 19* 19*  GLUCOSE 112* 91 120* 114* 118*  BUN 44* 45* 49* 56* 62*  CREATININE 1.89* 1.82* 2.33* 3.12* 3.06*  CALCIUM 8.5* 8.6*  8.9 8.8* 9.1  MG  --  1.5* 2.0  --   --     GFR: Estimated Creatinine Clearance: 32.9 mL/min (A) (by C-G formula based on SCr of 3.06 mg/dL (H)).  Scheduled Meds:  sodium chloride   Intravenous Once   acetaminophen  1,000 mg Oral TID   amitriptyline  75 mg Oral QHS   aspirin EC  81 mg Oral Q breakfast   atorvastatin  80 mg Oral QHS   gabapentin  300 mg Oral BID   heparin  5,000 Units Subcutaneous Q8H   insulin aspart  0-15 Units Subcutaneous TID WC   levothyroxine  75 mcg Oral QAC breakfast   metoprolol succinate  12.5 mg Oral BID   mometasone-formoterol  2 puff Inhalation BID   oxyCODONE  5 mg Oral Q4H   tamsulosin  0.8 mg Oral Daily   venlafaxine XR  225 mg Oral Daily   Continuous Infusions:  ceFEPime (MAXIPIME) IV 2 g (10/06/22 0503)   Imaging and lab data was personally reviewed US RENAL  Result Date: 10/05/2022 CLINICAL DATA:  Acute kidney injury. EXAM: RENAL / URINARY TRACT ULTRASOUND COMPLETE COMPARISON:  Renal ultrasound 09/26/2022 and CT scan 06/10/2022 FINDINGS: Right Kidney: Renal measurements: 12.3 x 6.0 x 6.3 cm = volume: 239.4 mL. Normal renal cortical thickness and echogenicity without focal lesions or hydronephrosis. Left Kidney: Renal measurements: 12.6 x 5.8 x 6.1 cm = volume: 230.2 mL. Normal renal cortical thickness and echogenicity without focal lesions or hydronephrosis. Bladder: Appears normal for degree of bladder distention. Other: None. IMPRESSION: Normal renal ultrasound examination. Electronically Signed   By: Marijo Sanes M.D.   On: 10/05/2022 16:19    LOS: 4 days   Author: Debbe Odea  10/06/2022 11:41 AM  To contact [facility]>   Check the care team in [facility] and look for the attending/consulting [place] provider listed  Log into [email or web address] and use [place]'s universal password   Go to> "[facility]"  and find provider  If you still have difficulty reaching the provider, please page the [facility] (Director on Call) for the Hospitalists  listed on amion

## 2022-10-06 NOTE — Progress Notes (Signed)
[facility] Progress Note  Name: Shane Chambers MRN: [id number] DOB: [birth year]  Chief Complaint:  Right lower extremity pain and swelling  Subjective:   renal was consulted yesterday for AKI.  UOP 600 mL over 11/26 charted.  He is getting a unit of blood.  I spoke with RN who reported patient has been pretty sedated today and patient had difficulty verbalizing with RN this am.  Spoke with primary team and states that pt got increased dose of dilaudid today.  team feels this is due to narcotics.  Pt was alert and oriented yesterday per primary report  Review of systems:  Unable to obtain given AMS   ------------------ Background on consult:  Shane Chambers is a/an 61 y.o. male with a past medical history of DM2, CAD, CABG, COPD, HTN, PVD who presents with right lower extremity pain and swelling.  The patient is in significant pain postoperatively and has a hard time providing history.  History was mainly obtained per chart review. Patient was hospitalized from 11/16 until 11/22 with cellulitis of the right lower extremity.  He was treated with antibiotics and discharged with oral antibiotics.  The patient presented 2 days ago because he felt like his leg was getting worse and he was having more pain.  He was unable to ambulate easily because of it.  He also had some chills and possibly some fevers.  He does not remember taking any NSAIDs at his facility but he was on losartan.  He denied any chest pain, shortness of breath, nausea, vomiting, diarrhea, dysuria, hematuria. In the emergency department found to have leukocytosis and CT scan was concerning for pyomyositis and likely osteomyelitis.  Patient was started on antibiotics and orthopedics was consulted.  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  Patient is being treated with vancomycin and cefepime.  No obvious hypotensive episodes.  Baseline creatinine is likely around 1.4-1.8.  Creatinine was 1.8 on 11/24 and increased to 2.3 yesterday  and now 3.1.  Patient's albumin is fairly low at 2.  No obvious hypotension with procedure today  Intake/Output Summary (Last 24 hours) at 10/06/2022 1142 Last data filed at 10/06/2022 0600 Gross per 24 hour  Intake --  Output 940 ml  Net -940 ml    Vitals:  Vitals:   10/05/22 2320 10/06/22 0744 10/06/22 0817 10/06/22 1057  BP: (!) 150/81  (!) 143/77 133/65  Pulse: 78  65 68  Resp: '14  12 11  '$ Temp: 98 F (36.7 C)  97.6 F (36.4 C) 97.6 F (36.4 C)  TempSrc: Axillary  Axillary Axillary  SpO2: 96% 99% 100% 99%  Weight:      Height:         Physical Exam:  General adult male in bed, sleepy and does not respond to questions  HEENT normocephalic atraumatic extraocular movements intact sclera anicteric Neck supple trachea midline Lungs clear to auscultation bilaterally normal work of breathing at rest on 2 liters Heart S1S2 no rub Abdomen soft nontender nondistended Extremities no edema; right BKA with brace on  Neuro - somnolent and is agitated.  Does not respond to questions or commands or open his eyes   Medications reviewed   Labs:     Latest Ref Rng & Units 10/06/2022    6:41 AM 10/05/2022    8:16 AM 10/04/2022    2:31 AM  BMP  Glucose 70 - 99 mg/dL 118  114  120   BUN 6 - 20 mg/dL 62  56  49  Creatinine 0.61 - 1.24 mg/dL 3.06  3.12  2.33   Sodium 135 - 145 mmol/L 136  132  134   Potassium 3.5 - 5.1 mmol/L 5.0  4.2  3.9   Chloride 98 - 111 mmol/L 103  100  99   CO2 22 - 32 mmol/L '19  19  19   '$ Calcium 8.9 - 10.3 mg/dL 9.1  8.8  8.9      Assessment/Plan:   Shane Chambers is a/an 61 y.o. male with a past medical history DM2, CAD, CABG, COPD, HTN, PVD who presents with osteomyelitis c/b AKI   Non-Oliguric AKI on CKD 3b: Rising since admission.  Pre-renal insults of infection and had been on ARB and diuretic.  Could also be related to vancomycin. Note random vanc was 33 on 11/26.  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  Management per orthopedics and antibiotics per primary team   CKD stage 3b - note his baseline Cr appears to be 1.4 - 1.8 per renal charting  AMS - has received sedating meds per RN. Will stop gabapentin (in light of renal failure if this is resumed would need max of 300 mg daily for now) - note he is on cefepime which can cause AMS esp with renal failure patients.  Reaching out to primary team -would reduce cefepime dose per renal function - he is currently on 2 gram every 12 hours - could try 1 gram or change to alternate regimen if mental status doesn't improve.  Pt does also have AKI but this is thankfully plateauing    Hypertension: Holding losartan.  Continue other current regimen    Hyperkalemia - change to renal diet; also starting bicarb   Hyponatremia: Mild; in the setting of AKI likely related to free water retention.   Metabolic acidosis: Start sodium bicarbonate   Uncontrolled Diabetes Mellitus Type 2 with Hyperglycemia: Management per primary   Anemia: Likely related to illness.  Transfusions per primary and getting one unit now   Disposition - would continue inpatient monitoring.  Per ortho note they anticipate discharge to SNF    Claudia Desanctis, MD 10/06/2022 12:15 PM

## 2022-10-06 NOTE — Progress Notes (Signed)
Patient ID: Shane Chambers, male   DOB: 1960-12-16, 61 y.o.   MRN: [id number] Patient is postoperative day 1 transtibial amputation.  There is 150 cc in the wound VAC canister.  Anticipate discharge to skilled nursing.

## 2022-10-07 DIAGNOSIS — M009 Pyogenic arthritis, unspecified: Secondary | ICD-10-CM | POA: Diagnosis not present

## 2022-10-07 LAB — RENAL FUNCTION PANEL
Albumin: 2.1 g/dL — ABNORMAL LOW (ref 3.5–5.0)
Anion gap: 9 (ref 5–15)
BUN: 60 mg/dL — ABNORMAL HIGH (ref 6–20)
CO2: 20 mmol/L — ABNORMAL LOW (ref 22–32)
Calcium: 9 mg/dL (ref 8.9–10.3)
Chloride: 109 mmol/L (ref 98–111)
Creatinine, Ser: 2.62 mg/dL — ABNORMAL HIGH (ref 0.61–1.24)
GFR, Estimated: 27 mL/min — ABNORMAL LOW (ref >60)
Glucose, Bld: 89 mg/dL (ref 70–99)
Phosphorus: 5.1 mg/dL — ABNORMAL HIGH (ref 2.5–4.6)
Potassium: 4.8 mmol/L (ref 3.5–5.1)
Sodium: 138 mmol/L (ref 135–145)

## 2022-10-07 LAB — TYPE AND SCREEN
ABO/RH(D): A POS
Antibody Screen: NEGATIVE
Unit division: 0
Unit division: 0

## 2022-10-07 LAB — BPAM RBC
Blood Product Expiration Date: 202312012359
Blood Product Expiration Date: 202312022359
ISSUE DATE / TIME: 202311260153
ISSUE DATE / TIME: 202311271051
Unit Type and Rh: 600
Unit Type and Rh: 6200

## 2022-10-07 LAB — URINALYSIS, COMPLETE (UACMP) WITH MICROSCOPIC
Bilirubin Urine: NEGATIVE
Glucose, UA: NEGATIVE mg/dL
Ketones, ur: NEGATIVE mg/dL
Leukocytes,Ua: NEGATIVE
Nitrite: NEGATIVE
Protein, ur: 100 mg/dL — AB
Specific Gravity, Urine: 1.025 (ref 1.005–1.030)
pH: 5 (ref 5.0–8.0)

## 2022-10-07 LAB — CBC
HCT: 24.1 % — ABNORMAL LOW (ref 39.0–52.0)
Hemoglobin: 7.5 g/dL — ABNORMAL LOW (ref 13.0–17.0)
MCH: 28.1 pg (ref 26.0–34.0)
MCHC: 31.1 g/dL (ref 30.0–36.0)
MCV: 90.3 fL (ref 80.0–100.0)
Platelets: 528 10*3/uL — ABNORMAL HIGH (ref 150–400)
RBC: 2.67 MIL/uL — ABNORMAL LOW (ref 4.22–5.81)
RDW: 14.9 % (ref 11.5–15.5)
WBC: 13.7 10*3/uL — ABNORMAL HIGH (ref 4.0–10.5)
nRBC: 0 % (ref 0.0–0.2)

## 2022-10-07 LAB — GLUCOSE, CAPILLARY
Glucose-Capillary: 83 mg/dL (ref 70–99)
Glucose-Capillary: 85 mg/dL (ref 70–99)
Glucose-Capillary: 100 mg/dL — ABNORMAL HIGH (ref 70–99)
Glucose-Capillary: 117 mg/dL — ABNORMAL HIGH (ref 70–99)

## 2022-10-07 MED ORDER — SODIUM CHLORIDE 0.9 % IV SOLN
2.0000 g | INTRAVENOUS | Status: AC
  Administered 2022-10-07 – 2022-10-08 (×2): 2 g via INTRAVENOUS
  Filled 2022-10-07 (×2): qty 20

## 2022-10-07 MED ORDER — LORAZEPAM 2 MG/ML IJ SOLN
1.0000 mg | Freq: Once | INTRAMUSCULAR | Status: AC | PRN
  Administered 2022-10-07: 1 mg via INTRAVENOUS
  Filled 2022-10-07: qty 1

## 2022-10-07 MED ORDER — SODIUM CHLORIDE 0.9 % IV SOLN: INTRAVENOUS | Status: AC

## 2022-10-07 MED ORDER — VANCOMYCIN HCL 1250 MG/250ML IV SOLN
1250.0000 mg | INTRAVENOUS | Status: AC
  Administered 2022-10-07 – 2022-10-08 (×2): 1250 mg via INTRAVENOUS
  Filled 2022-10-07 (×3): qty 250

## 2022-10-07 MED ORDER — ACETAMINOPHEN 10 MG/ML IV SOLN
1000.0000 mg | Freq: Every day | INTRAVENOUS | Status: AC
  Administered 2022-10-07: 1000 mg via INTRAVENOUS
  Filled 2022-10-07: qty 100

## 2022-10-07 MED ORDER — CHLORHEXIDINE GLUCONATE CLOTH 2 % EX PADS
6.0000 | MEDICATED_PAD | Freq: Every day | CUTANEOUS | Status: AC
  Administered 2022-10-07 – 2022-10-11 (×5): 6 via TOPICAL

## 2022-10-07 MED ORDER — MUPIROCIN 2 % EX OINT
1.0000 | TOPICAL_OINTMENT | Freq: Two times a day (BID) | CUTANEOUS | Status: AC
  Administered 2022-10-07 – 2022-10-11 (×10): 1 via NASAL
  Filled 2022-10-07 (×3): qty 22

## 2022-10-07 MED ORDER — SODIUM CHLORIDE 0.9 % IV SOLN
2.0000 g | Freq: Two times a day (BID) | INTRAVENOUS | Status: DC
  Administered 2022-10-07: 2 g via INTRAVENOUS
  Filled 2022-10-07: qty 12.5

## 2022-10-07 NOTE — Progress Notes (Signed)
Patient ID: Shane Chambers, male   DOB: [birth year], 61 y.o.   MRN: [id number] Patient is postoperative day 2 transtibial amputation.  There is 250 cc in the wound VAC canister.  Patient will continue with the wound VAC during the hospital stay and will have the wound VAC discontinued at discharge and a dry dressing applied.

## 2022-10-07 NOTE — Progress Notes (Signed)
Patient confused disoriented x4, not responding to questions/commands.  RN unable to give his scheduled oral medications at this time. MD notified

## 2022-10-07 NOTE — Progress Notes (Signed)
[facility] night cross cover note:   I was notified by RN of the patient has been confused starting with day shift on 11/27, and noted be intermittently yelling out over that time. While awake, he is unable to follow instructions, and unable to take his evening scheduled po meds as a result, including scheduled metoprolol succinate. In setting of persistent yelling, I placed one-time order for prn IV ativan for agitation.      Babs Bertin, DO Hospitalist

## 2022-10-07 NOTE — Plan of Care (Signed)
Problem: Clinical Measurements: Goal: Ability to avoid or minimize complications of infection will improve 10/07/2022 2332 by Lonny Prude, RN Outcome: Progressing 10/07/2022 2332 by Lonny Prude, RN Outcome: Progressing   Problem: Skin Integrity: Goal: Skin integrity will improve 10/07/2022 2332 by Lonny Prude, RN Outcome: Progressing 10/07/2022 2332 by Lonny Prude, RN Outcome: Progressing   Problem: Education: Goal: Knowledge of General Education information will improve Description: Including pain rating scale, medication(s)/side effects and non-pharmacologic comfort measures 10/07/2022 2332 by Lonny Prude, RN Outcome: Progressing 10/07/2022 2332 by Lonny Prude, RN Outcome: Progressing   Problem: Health Behavior/Discharge Planning: Goal: Ability to manage health-related needs will improve 10/07/2022 2332 by Lonny Prude, RN Outcome: Progressing 10/07/2022 2332 by Lonny Prude, RN Outcome: Progressing   Problem: Clinical Measurements: Goal: Ability to maintain clinical measurements within normal limits will improve 10/07/2022 2332 by Lonny Prude, RN Outcome: Progressing 10/07/2022 2332 by Lonny Prude, RN Outcome: Progressing Goal: Will remain free from infection 10/07/2022 2332 by Lonny Prude, RN Outcome: Progressing 10/07/2022 2332 by Lonny Prude, RN Outcome: Progressing Goal: Diagnostic test results will improve 10/07/2022 2332 by Lonny Prude, RN Outcome: Progressing 10/07/2022 2332 by Lonny Prude, RN Outcome: Progressing Goal: Respiratory complications will improve 10/07/2022 2332 by Lonny Prude, RN Outcome: Progressing 10/07/2022 2332 by Lonny Prude, RN Outcome: Progressing Goal: Cardiovascular complication will be avoided 10/07/2022 2332 by Lonny Prude, RN Outcome: Progressing 10/07/2022 2332 by Lonny Prude, RN Outcome: Progressing   Problem: Activity: Goal: Risk for activity intolerance will decrease 10/07/2022 2332  by Lonny Prude, RN Outcome: Progressing 10/07/2022 2332 by Lonny Prude, RN Outcome: Progressing   Problem: Nutrition: Goal: Adequate nutrition will be maintained 10/07/2022 2332 by Lonny Prude, RN Outcome: Progressing 10/07/2022 2332 by Lonny Prude, RN Outcome: Progressing   Problem: Coping: Goal: Level of anxiety will decrease 10/07/2022 2332 by Lonny Prude, RN Outcome: Progressing 10/07/2022 2332 by Lonny Prude, RN Outcome: Progressing   Problem: Elimination: Goal: Will not experience complications related to bowel motility 10/07/2022 2332 by Lonny Prude, RN Outcome: Progressing 10/07/2022 2332 by Lonny Prude, RN Outcome: Progressing Goal: Will not experience complications related to urinary retention 10/07/2022 2332 by Lonny Prude, RN Outcome: Progressing 10/07/2022 2332 by Lonny Prude, RN Outcome: Progressing   Problem: Pain Managment: Goal: General experience of comfort will improve 10/07/2022 2332 by Lonny Prude, RN Outcome: Progressing 10/07/2022 2332 by Lonny Prude, RN Outcome: Progressing   Problem: Safety: Goal: Ability to remain free from injury will improve 10/07/2022 2332 by Lonny Prude, RN Outcome: Progressing 10/07/2022 2332 by Lonny Prude, RN Outcome: Progressing   Problem: Skin Integrity: Goal: Risk for impaired skin integrity will decrease 10/07/2022 2332 by Lonny Prude, RN Outcome: Progressing 10/07/2022 2332 by Lonny Prude, RN Outcome: Progressing   Problem: Education: Goal: Ability to describe self-care measures that may prevent or decrease complications (Diabetes Survival Skills Education) will improve 10/07/2022 2332 by Lonny Prude, RN Outcome: Progressing 10/07/2022 2332 by Lonny Prude, RN Outcome: Progressing Goal: Individualized Educational Video(s) 10/07/2022 2332 by Lonny Prude, RN Outcome: Progressing 10/07/2022 2332 by Lonny Prude, RN Outcome: Progressing   Problem: Coping: Goal:  Ability to adjust to condition or change in health will improve 10/07/2022 2332 by Lonny Prude, RN Outcome: Progressing 10/07/2022 2332 by Lonny Prude, RN Outcome: Progressing   Problem: Fluid Volume:  Goal: Ability to maintain a balanced intake and output will improve 10/07/2022 2332 by Lonny Prude, RN Outcome: Progressing 10/07/2022 2332 by Lonny Prude, RN Outcome: Progressing   Problem: Health Behavior/Discharge Planning: Goal: Ability to identify and utilize available resources and services will improve 10/07/2022 2332 by Lonny Prude, RN Outcome: Progressing 10/07/2022 2332 by Lonny Prude, RN Outcome: Progressing Goal: Ability to manage health-related needs will improve 10/07/2022 2332 by Lonny Prude, RN Outcome: Progressing 10/07/2022 2332 by Lonny Prude, RN Outcome: Progressing   Problem: Metabolic: Goal: Ability to maintain appropriate glucose levels will improve 10/07/2022 2332 by Lonny Prude, RN Outcome: Progressing 10/07/2022 2332 by Lonny Prude, RN Outcome: Progressing   Problem: Nutritional: Goal: Maintenance of adequate nutrition will improve 10/07/2022 2332 by Lonny Prude, RN Outcome: Progressing 10/07/2022 2332 by Lonny Prude, RN Outcome: Progressing Goal: Progress toward achieving an optimal weight will improve 10/07/2022 2332 by Lonny Prude, RN Outcome: Progressing 10/07/2022 2332 by Lonny Prude, RN Outcome: Progressing   Problem: Skin Integrity: Goal: Risk for impaired skin integrity will decrease 10/07/2022 2332 by Lonny Prude, RN Outcome: Progressing 10/07/2022 2332 by Lonny Prude, RN Outcome: Progressing   Problem: Tissue Perfusion: Goal: Adequacy of tissue perfusion will improve 10/07/2022 2332 by Lonny Prude, RN Outcome: Progressing 10/07/2022 2332 by Lonny Prude, RN Outcome: Progressing   Problem: Education: Goal: Knowledge of the prescribed therapeutic regimen will improve 10/07/2022 2332 by Lonny Prude, RN Outcome: Progressing 10/07/2022 2332 by Lonny Prude, RN Outcome: Progressing Goal: Ability to verbalize activity precautions or restrictions will improve 10/07/2022 2332 by Lonny Prude, RN Outcome: Progressing 10/07/2022 2332 by Lonny Prude, RN Outcome: Progressing Goal: Understanding of discharge needs will improve 10/07/2022 2332 by Lonny Prude, RN Outcome: Progressing 10/07/2022 2332 by Lonny Prude, RN Outcome: Progressing   Problem: Activity: Goal: Ability to perform//tolerate increased activity and mobilize with assistive devices will improve 10/07/2022 2332 by Lonny Prude, RN Outcome: Progressing 10/07/2022 2332 by Lonny Prude, RN Outcome: Progressing   Problem: Clinical Measurements: Goal: Postoperative complications will be avoided or minimized 10/07/2022 2332 by Lonny Prude, RN Outcome: Progressing 10/07/2022 2332 by Lonny Prude, RN Outcome: Progressing   Problem: Self-Care: Goal: Ability to meet self-care needs will improve 10/07/2022 2332 by Lonny Prude, RN Outcome: Progressing 10/07/2022 2332 by Lonny Prude, RN Outcome: Progressing   Problem: Self-Concept: Goal: Ability to maintain and perform role responsibilities to the fullest extent possible will improve 10/07/2022 2332 by Lonny Prude, RN Outcome: Progressing 10/07/2022 2332 by Lonny Prude, RN Outcome: Progressing   Problem: Pain Management: Goal: Pain level will decrease with appropriate interventions 10/07/2022 2332 by Lonny Prude, RN Outcome: Progressing 10/07/2022 2332 by Lonny Prude, RN Outcome: Progressing   Problem: Skin Integrity: Goal: Demonstration of wound healing without infection will improve 10/07/2022 2332 by Lonny Prude, RN Outcome: Progressing 10/07/2022 2332 by Lonny Prude, RN Outcome: Progressing

## 2022-10-07 NOTE — Progress Notes (Signed)
[facility] Progress Note  Name: Shane Chambers MRN: [id number] DOB: August 13, 1961  Chief Complaint:  Right lower extremity pain and swelling  Subjective:  Pt had 1.6 liters UOP over 11/27 charted.  Per nurse tech patient has been yelling out throughout the night.  (Improved from his previous baseline of difficulty arousing with sternal rub).  Purewick is now full and tech is going to change this out   Review of systems:   Unable to obtain given AMS   ------------------ Background on consult:  Shane Chambers is a/an 61 y.o. male with a past medical history of DM2, CAD, CABG, COPD, HTN, PVD who presents with right lower extremity pain and swelling.  The patient is in significant pain postoperatively and has a hard time providing history.  History was mainly obtained per chart review. Patient was hospitalized from 11/16 until 11/22 with cellulitis of the right lower extremity.  He was treated with antibiotics and discharged with oral antibiotics.  The patient presented 2 days ago because he felt like his leg was getting worse and he was having more pain.  He was unable to ambulate easily because of it.  He also had some chills and possibly some fevers.  He does not remember taking any NSAIDs at his facility but he was on losartan.  He denied any chest pain, shortness of breath, nausea, vomiting, diarrhea, dysuria, hematuria. In the emergency department found to have leukocytosis and CT scan was concerning for pyomyositis and likely osteomyelitis.  Patient was started on antibiotics and orthopedics was consulted.  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  Patient is being treated with vancomycin and cefepime.  No obvious hypotensive episodes.  Baseline creatinine is likely around 1.4-1.8.  Creatinine was 1.8 on 11/24 and increased to 2.3 yesterday and now 3.1.  Patient's albumin is fairly low at 2.  No obvious hypotension with procedure today  Intake/Output Summary (Last 24 hours) at 10/07/2022  1351 Last data filed at 10/07/2022 1201 Gross per 24 hour  Intake 713.45 ml  Output 2350 ml  Net -1636.55 ml    Vitals:  Vitals:   10/06/22 2351 10/07/22 0407 10/07/22 0800 10/07/22 1200  BP: (!) 150/90 137/61 (!) 147/59 (!) 162/62  Pulse: 87 80 80 (!) 104  Resp: '13 16 18 20  '$ Temp: 98 F (36.7 C) 98 F (36.7 C) 98.6 F (37 C) 98 F (36.7 C)  TempSrc: Axillary Axillary Oral Oral  SpO2: 96% 92% 92% 95%  Weight:      Height:         Physical Exam:    General adult male in bed, nonverbal for me   HEENT normocephalic atraumatic extraocular movements intact sclera anicteric Neck supple trachea midline Lungs clear to auscultation bilaterally normal work of breathing at rest on room air Heart S1S2 no rub Abdomen soft nontender nondistended Extremities no pitting edema; right BKA with brace on  Neuro - agitated. Eyes are open and he intermittently yells out but is non-verbal "ahhhh"    Medications reviewed   Labs:     Latest Ref Rng & Units 10/07/2022    5:51 AM 10/06/2022    6:41 AM 10/05/2022    8:16 AM  BMP  Glucose 70 - 99 mg/dL 89  118  114   BUN 6 - 20 mg/dL 60  62  56   Creatinine 0.61 - 1.24 mg/dL 2.62  3.06  3.12   Sodium 135 - 145 mmol/L 138  136  132  Potassium 3.5 - 5.1 mmol/L 4.8  5.0  4.2   Chloride 98 - 111 mmol/L 109  103  100   CO2 22 - 32 mmol/L '20  19  19   '$ Calcium 8.9 - 10.3 mg/dL 9.0  9.1  8.8      Assessment/Plan:   Shane Chambers is a/an 61 y.o. male with a past medical history DM2, CAD, CABG, COPD, HTN, PVD who presents with osteomyelitis c/b AKI   Non-Oliguric AKI on CKD 3b: Rising since admission.  Pre-renal insults of infection and had been on ARB and diuretic.  Could also be related to vancomycin. Note random vanc was 33 on 11/26.  Renal US normal echogenicity and no hydro.  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  Management per orthopedics and antibiotics per primary team   CKD stage 3b - note his baseline Cr appears to be 1.4 - 1.8 per renal charting  Proteinuria  - check up/cr ratio.  100 mg/dl protein noted back to 2021 UA on brief review  AMS  - per primary team  - still altered but awake on arrival - yelling out - I stopped gabapentin (in light of renal failure if this is resumed would need max of 300 mg daily for now) - note he was on cefepime - he has been transitioned to another agent per primary team  - optimizing AKI as above  Hypertension: Holding losartan.  Continue other current regimen    Hyperkalemia - changed to renal diet; on bicarb   Hyponatremia: Mild; in the setting of AKI likely related to free water retention.   Metabolic acidosis: on sodium bicarbonate   Uncontrolled Diabetes Mellitus Type 2 with Hyperglycemia: Management per primary   Anemia: Likely related to illness.  Transfusions per primary team  Disposition - would continue inpatient monitoring.  Per ortho note they anticipate discharge to SNF    Claudia Desanctis, MD 10/07/2022 2:07 PM

## 2022-10-07 NOTE — Progress Notes (Signed)
[facility] Progress Note  Patient: Shane Chambers     IRJ:[id number]  DOA: 10/02/2022   PCP: Caprice Renshaw, MD       Brief hospital course: This is a 61 year old male with diabetes mellitus, coronary artery disease status post CABG, COPD, hypertension, coronary artery disease, peripheral vascular disease who presents to the hospital with right leg pain and swelling. The patient was hospitalized from 11/16 through 11/22 for cellulitis of the right leg, treated with ceftriaxone and vancomycin and discharged with doxycycline and cefadroxil. Blood cultures grew Bacillus species which was felt to be a contaminant.   Subjective:  Agitated due to pain. He was given Ativan in the AM and has been delirious since the Ativan. He is still yelling out due to pain when he is awake but now is not able to communicate further.   Assessment and Plan: Principal Problem:   Cellulitis-septic arthritis Severe post op pain - WBC noted to be 18.6 - Imaging with CT scan and MRI suggestive of right ankle septic arthritis with osteomyelitis.  - 11/26- s/p AKA - has been receiving vancomycin and cefepime - recommended to continue x 3 days post op per Dr Sharol Given - due to delirium today, change Cefepime to Ceftriaxone- unable to change to Zosyn- he is allergic to PCN - having difficulty with pain control- cont IV pain meds today as he is too delirious from the Ativan to take oral meds  Active Problems:  Delirium - due to Ativan- will need to avoid all benzodiazepines    AKI with metabolic acidosis - Baseline creatinine is about 0.9-1.4 - Creatinine has been steadily rising -Continue to hold losartan and torsemide - have asked for nephrology consult - renal ultrasound unremarkable - Cr is now improving  Acute blood loss anemia-anemia of chronic disease - Hemoglobin is 7.5 now - transfused 1 U PRBC on 11/25 and again on 11/27 - anemia panel was obtained on 11/20-ferritin was 913 - iron saturation  was low as was iron binding - IV Ferric gluconate given on 11/27 - cont to follow Hgb- has bloody drainage in wound vac and may need further transfusions    Type 2 diabetes mellitus (HCC) -Home medications include 70/30 and Glucophage -NovoLog sliding scale ordered -Hemoglobin A1C    Component Value Date/Time   HGBA1C 5.6 09/26/2022 0404   Hypothyroidism - Continue levothyroxine  Chronic diastolic heart failure/hypertension - Last echo from 2021 revealed grade 2 diastolic dysfunction with an EF of 55 to 60% - Diuretics currently on hold due to AKI -Continue metoprolol  History of coronary artery disease status post CABG - Continue aspirin and Lipitor  BPH - Continue Flomax        Code Status: Full Code Consultants: Orthopedic surgery Level of Care: Level of care: Telemetry Medical Total time on patient care: 35 DVT prophylaxis:  heparin injection 5,000 Units Start: 10/03/22 0600 SCDs Start: 10/03/22 0127     Objective:   Vitals:   10/06/22 2315 10/06/22 2351 10/07/22 0407 10/07/22 0800  BP: (!) 153/110 (!) 150/90 137/61 (!) 147/59  Pulse: 79 87 80 80  Resp: '18 13 16 18  '$ Temp: 98.1 F (36.7 C) 98 F (36.7 C) 98 F (36.7 C) 98.6 F (37 C)  TempSrc: Axillary Axillary Axillary Oral  SpO2: (!) 88% 96% 92% 92%  Weight:      Height:       Filed Weights   10/05/22 1026  Weight: 118 kg   Exam: General exam: Appears uncomfortable- yelling  due to pain HEENT: oral mucosa moist Respiratory system: Clear to auscultation.  Cardiovascular system: S1 & S2 heard  Gastrointestinal system: Abdomen soft, non-tender, nondistended. Normal bowel sounds   Extremities: No cyanosis, clubbing or edema- right AKA        CBC: Recent Labs  Lab 10/02/22 1720 10/03/22 0519 10/04/22 0231 10/05/22 0816 10/06/22 0641  WBC 18.6* 17.8* 17.9* 17.5* 17.3*  NEUTROABS 14.2* 13.9* 14.1* 13.5* 13.7*  HGB 7.7* 7.5* 7.1* 7.4* 7.0*  HCT 24.0* 23.9* 21.7* 23.7* 21.4*  MCV 87.9  87.9 85.8 89.1 87.7  PLT 440* 464* 475* 489* 473*    Basic Metabolic Panel: Recent Labs  Lab 10/03/22 0519 10/04/22 0231 10/05/22 0816 10/06/22 0641 10/07/22 0551  NA 136 134* 132* 136 138  K 3.8 3.9 4.2 5.0 4.8  CL 102 99 100 103 109  CO2 22 19* 19* 19* 20*  GLUCOSE 91 120* 114* 118* 89  BUN 45* 49* 56* 62* 60*  CREATININE 1.82* 2.33* 3.12* 3.06* 2.62*  CALCIUM 8.6* 8.9 8.8* 9.1 9.0  MG 1.5* 2.0  --   --   --   PHOS  --   --   --   --  5.1*    GFR: Estimated Creatinine Clearance: 38.5 mL/min (A) (by C-G formula based on SCr of 2.62 mg/dL (H)).  Scheduled Meds:  amitriptyline  75 mg Oral QHS   aspirin EC  81 mg Oral Q breakfast   atorvastatin  80 mg Oral QHS   heparin  5,000 Units Subcutaneous Q8H   insulin aspart  0-15 Units Subcutaneous TID WC   levothyroxine  75 mcg Oral QAC breakfast   metoprolol succinate  12.5 mg Oral BID   mometasone-formoterol  2 puff Inhalation BID   oxyCODONE  5 mg Oral Q4H   sodium bicarbonate  650 mg Oral BID   tamsulosin  0.8 mg Oral Daily   venlafaxine XR  225 mg Oral Daily   Continuous Infusions:  acetaminophen     ceFEPime (MAXIPIME) IV 2 g (10/07/22 0940)   vancomycin     Imaging and lab data was personally reviewed US RENAL  Result Date: 10/05/2022 CLINICAL DATA:  Acute kidney injury. EXAM: RENAL / URINARY TRACT ULTRASOUND COMPLETE COMPARISON:  Renal ultrasound 09/26/2022 and CT scan 06/10/2022 FINDINGS: Right Kidney: Renal measurements: 12.3 x 6.0 x 6.3 cm = volume: 239.4 mL. Normal renal cortical thickness and echogenicity without focal lesions or hydronephrosis. Left Kidney: Renal measurements: 12.6 x 5.8 x 6.1 cm = volume: 230.2 mL. Normal renal cortical thickness and echogenicity without focal lesions or hydronephrosis. Bladder: Appears normal for degree of bladder distention. Other: None. IMPRESSION: Normal renal ultrasound examination. Electronically Signed   By: Marijo Sanes M.D.   On: 10/05/2022 16:19    LOS: 5 days    Author: Debbe Odea  10/07/2022 9:59 AM  To contact [facility]>   Check the care team in [facility] and look for the attending/consulting [place] provider listed  Log into [email or web address] and use [place]'s universal password   Go to> "[facility]"  and find provider  If you still have difficulty reaching the provider, please page the [facility] (Director on Call) for the Hospitalists listed on amion

## 2022-10-08 ENCOUNTER — Inpatient Hospital Stay (HOSPITAL_COMMUNITY): Payer: Medicare Other

## 2022-10-08 DIAGNOSIS — R410 Disorientation, unspecified: Secondary | ICD-10-CM | POA: Diagnosis not present

## 2022-10-08 DIAGNOSIS — N179 Acute kidney failure, unspecified: Secondary | ICD-10-CM | POA: Diagnosis not present

## 2022-10-08 DIAGNOSIS — M86271 Subacute osteomyelitis, right ankle and foot: Secondary | ICD-10-CM | POA: Diagnosis not present

## 2022-10-08 LAB — BASIC METABOLIC PANEL
Anion gap: 15 (ref 5–15)
BUN: 43 mg/dL — ABNORMAL HIGH (ref 6–20)
CO2: 15 mmol/L — ABNORMAL LOW (ref 22–32)
Calcium: 9.2 mg/dL (ref 8.9–10.3)
Chloride: 113 mmol/L — ABNORMAL HIGH (ref 98–111)
Creatinine, Ser: 2.03 mg/dL — ABNORMAL HIGH (ref 0.61–1.24)
GFR, Estimated: 37 mL/min — ABNORMAL LOW (ref >60)
Glucose, Bld: 132 mg/dL — ABNORMAL HIGH (ref 70–99)
Potassium: 4.3 mmol/L (ref 3.5–5.1)
Sodium: 143 mmol/L (ref 135–145)

## 2022-10-08 LAB — GLUCOSE, CAPILLARY
Glucose-Capillary: 127 mg/dL — ABNORMAL HIGH (ref 70–99)
Glucose-Capillary: 147 mg/dL — ABNORMAL HIGH (ref 70–99)
Glucose-Capillary: 151 mg/dL — ABNORMAL HIGH (ref 70–99)
Glucose-Capillary: 142 mg/dL — ABNORMAL HIGH (ref 70–99)

## 2022-10-08 LAB — CBC
HCT: 24 % — ABNORMAL LOW (ref 39.0–52.0)
Hemoglobin: 7.7 g/dL — ABNORMAL LOW (ref 13.0–17.0)
MCH: 28.5 pg (ref 26.0–34.0)
MCHC: 32.1 g/dL (ref 30.0–36.0)
MCV: 88.9 fL (ref 80.0–100.0)
Platelets: 617 10*3/uL — ABNORMAL HIGH (ref 150–400)
RBC: 2.7 MIL/uL — ABNORMAL LOW (ref 4.22–5.81)
RDW: 14.9 % (ref 11.5–15.5)
WBC: 14.4 10*3/uL — ABNORMAL HIGH (ref 4.0–10.5)
nRBC: 0 % (ref 0.0–0.2)

## 2022-10-08 LAB — TYPE AND SCREEN
ABO/RH(D): A POS
Antibody Screen: NEGATIVE

## 2022-10-08 MED ORDER — QUETIAPINE FUMARATE 25 MG PO TABS: 25.0000 mg | ORAL_TABLET | Freq: Two times a day (BID) | ORAL | Status: DC

## 2022-10-08 MED ORDER — SODIUM BICARBONATE 650 MG PO TABS: 650.0000 mg | ORAL_TABLET | Freq: Three times a day (TID) | ORAL | Status: DC

## 2022-10-08 MED ORDER — SODIUM BICARBONATE 650 MG PO TABS: 1300.0000 mg | ORAL_TABLET | Freq: Two times a day (BID) | ORAL | Status: DC

## 2022-10-08 MED ORDER — HYDRALAZINE HCL 20 MG/ML IJ SOLN
5.0000 mg | INTRAMUSCULAR | Status: DC | PRN
  Administered 2022-10-09 (×2): 5 mg via INTRAVENOUS
  Filled 2022-10-08 (×2): qty 1

## 2022-10-08 MED ORDER — LACTATED RINGERS IV SOLN: INTRAVENOUS | Status: AC

## 2022-10-08 MED ORDER — HALOPERIDOL LACTATE 5 MG/ML IJ SOLN
2.0000 mg | Freq: Four times a day (QID) | INTRAMUSCULAR | Status: DC | PRN
  Administered 2022-10-08 – 2022-10-09 (×3): 2 mg via INTRAVENOUS
  Filled 2022-10-08 (×3): qty 1

## 2022-10-08 NOTE — Evaluation (Signed)
Occupational Therapy Evaluation Patient Details Name: Shane Chambers MRN: [id number] DOB: [birth year] Today's Date: 10/08/2022   History of Present Illness Pt is a 61 yo male admitted after recent hospitalization for cellulitis from 11/16-11/22. Pt sent home with antibiotics but returned with leg pain.  Pt underwent a R BKA and has had some delirium since and being followed by nephrology. CT scan with no acute abnomality.  PMH: DM, CAD, CABG, COPD, HTN, PVD, AKI.   Clinical Impression   Pt admitted with the above diagnosis and has the deficits listed below. Pt was very confused and agitated during evaluation. CT was neg for acute changes.  Pt unable to follow commands or participate at any level.  Pt comes from LTC facility and will need rehab post new R BKA. Will continue to follow and educate as able to increase independence with adls back to baseline.       Recommendations for follow up therapy are one component of a multi-disciplinary discharge planning process, led by the attending physician.  Recommendations may be updated based on patient status, additional functional criteria and insurance authorization.   Follow Up Recommendations  Skilled nursing-short term rehab (<3 hours/day)     Assistance Recommended at Discharge Frequent or constant Supervision/Assistance  Patient can return home with the following Two people to help with walking and/or transfers;Two people to help with bathing/dressing/bathroom;Assistance with cooking/housework;Direct supervision/assist for medications management;Assist for transportation;Help with stairs or ramp for entrance    Functional Status Assessment  Patient has had a recent decline in their functional status and demonstrates the ability to make significant improvements in function in a reasonable and predictable amount of time.  Equipment Recommendations  None recommended by OT    Recommendations for Other Services       Precautions /  Restrictions Precautions Precautions: Fall Required Braces or Orthoses: Splint/Cast Splint/Cast: For R BKA Restrictions Weight Bearing Restrictions: No      Mobility Bed Mobility Overal bed mobility: Needs Assistance Bed Mobility: Rolling Rolling: Total assist, +2 for physical assistance         General bed mobility comments: Deferred all other bed mobilty due to inability to participate and agitation.    Transfers                   General transfer comment: Did not attempt due to agitation      Balance                                           ADL either performed or assessed with clinical judgement   ADL Overall ADL's : Needs assistance/impaired Eating/Feeding: Total assistance   Grooming: Total assistance   Upper Body Bathing: Total assistance   Lower Body Bathing: Total assistance   Upper Body Dressing : Total assistance   Lower Body Dressing: Total assistance   Toilet Transfer: Total assistance   Toileting- Clothing Manipulation and Hygiene: Total assistance       Functional mobility during ADLs: Total assistance;+2 for physical assistance General ADL Comments: Pt unable to participate in any adls due to cognitive state     Vision   Vision Assessment?:  (to be evaluated when pt more cooperative) Additional Comments: Pt agitated and not follwing commands. Eyes open at times and yelling out with any mobilty.  CT-. Nursing suggested attempting to mobilize. Unable to do so.  Perception Perception Perception Tested?: No   Praxis Praxis Praxis tested?: Not tested    Pertinent Vitals/Pain Pain Assessment Pain Assessment: Faces Faces Pain Scale: Hurts even more Pain Location: RLE Pain Descriptors / Indicators: Discomfort, Grimacing, Moaning Pain Intervention(s): Limited activity within patient's tolerance, Monitored during session, Premedicated before session, Repositioned     Hand Dominance Right   Extremity/Trunk  Assessment Upper Extremity Assessment Upper Extremity Assessment: Difficult to assess due to impaired cognition   Lower Extremity Assessment Lower Extremity Assessment: Defer to PT evaluation   Cervical / Trunk Assessment Cervical / Trunk Assessment: Normal   Communication Communication Communication:  (Pt not speaking; only yelling out)   Cognition Arousal/Alertness: Suspect due to medications, Lethargic Behavior During Therapy: Agitated Overall Cognitive Status: Impaired/Different from baseline Area of Impairment: Orientation, Attention, Memory, Following commands, Safety/judgement, Awareness, Problem solving                 Orientation Level: Disoriented to, Person, Place, Time, Situation Current Attention Level: Focused Memory: Decreased recall of precautions, Decreased short-term memory Following Commands:  (Not following commands) Safety/Judgement: Decreased awareness of safety, Decreased awareness of deficits Awareness: Intellectual Problem Solving: Slow processing, Decreased initiation, Difficulty sequencing, Requires verbal cues, Requires tactile cues General Comments: Pt not folowing any commands, agitated and resisting all movements.     General Comments  Pt wet on arrival. Rolled side to side to clean pt and attempt bed mobility and initial eval with no cooperation from pt on this date.    Exercises     Shoulder Instructions      Home Living Family/patient expects to be discharged to:: Skilled nursing facility                                 Additional Comments: Pt lives in [place] facility      Prior Functioning/Environment Prior Level of Function : Needs assist       Physical Assist : Mobility (physical);ADLs (physical)     Mobility Comments: Pt was mod I with transfers and walked short distances with RW in June 2023. Unsure at this time. ADLs Comments: SNF staff assisted pt with adls PTA in June 9323        OT Problem List:  Decreased activity tolerance;Impaired balance (sitting and/or standing);Decreased cognition;Decreased safety awareness;Decreased knowledge of use of DME or AE;Decreased knowledge of precautions;Pain;Decreased strength      OT Treatment/Interventions: Self-care/ADL training;Therapeutic activities;DME and/or AE instruction    OT Goals(Current goals can be found in the care plan section) Acute Rehab OT Goals Patient Stated Goal: none stated OT Goal Formulation: Patient unable to participate in goal setting Time For Goal Achievement: 10/22/22 Potential to Achieve Goals: Fair ADL Goals Pt Will Perform Grooming: with supervision;sitting Pt Will Perform Upper Body Bathing: with set-up;sitting Pt Will Perform Lower Body Bathing: with set-up;bed level Pt Will Transfer to Toilet: anterior/posterior transfer;bedside commode;with mod assist Additional ADL Goal #1: Pt will follow 100% of commands to increase participation in adls.  OT Frequency: Min 2X/week    Co-evaluation              AM-PAC OT "6 Clicks" Daily Activity     Outcome Measure Help from another person eating meals?: Total Help from another person taking care of personal grooming?: Total Help from another person toileting, which includes using toliet, bedpan, or urinal?: Total Help from another person bathing (including washing, rinsing, drying)?: Total Help from another person  to put on and taking off regular upper body clothing?: Total Help from another person to put on and taking off regular lower body clothing?: Total 6 Click Score: 6   End of Session Nurse Communication: Mobility status;Other (comment) (cognitive status)  Activity Tolerance: Treatment limited secondary to agitation Patient left: in bed;with call bell/phone within reach;with nursing/sitter in room;with bed alarm set  OT Visit Diagnosis: Unsteadiness on feet (R26.81)                Time: 0623-7628 OT Time Calculation (min): 15 min Charges:  OT General  Charges $OT Visit: 1 Visit OT Evaluation $OT Eval Moderate Complexity: 1 Mod  Glenford Peers 10/08/2022, 11:58 AM

## 2022-10-08 NOTE — Progress Notes (Signed)
[facility] Progress Note  Patient: Shane Chambers     IHK:[id number]  DOA: 10/02/2022   PCP: Caprice Renshaw, MD       Brief hospital course: This is a 61 year old male with diabetes mellitus, coronary artery disease status post CABG, COPD, hypertension, coronary artery disease, peripheral vascular disease who presents to the hospital with right leg pain and swelling. The patient was hospitalized from 11/16 through 11/22 for cellulitis of the right leg, treated with ceftriaxone and vancomycin and discharged with doxycycline and cefadroxil. Blood cultures grew Bacillus species which was felt to be a contaminant.   Subjective:   Patient unable to provide any reliable complaints, he appears to be delirious, screaming loudly intermittently, this appears to be unrelated to pain.  Assessment and Plan: Principal Problem:   Cellulitis-septic arthritis/osteomyelitis Severe post op pain - WBC noted to be 18.6 - Imaging with CT scan and MRI suggestive of right ankle septic arthritis with osteomyelitis.  - 11/26- s/p AKA - has been receiving vancomycin and cefepime - recommended to continue x 3 days post op per Dr Sharol Given -Cefepime has been changed to Rocephin due to . encephalopathy.  unable to change to Zosyn- he is allergic to PCN -Screaming intermittently, appears comfortable, not in pain, but will continue with current regimen (he only required 3 doses of Dilaudid yesterday) .  Delirium Acute metabolic encephalopathy -Patient is screaming/yelling intermittently, unable to answer any commands or follow any questions will obtain CT head. -Appears to be due to hospital delirium, will keep on as needed Haldol and started on scheduled Seroquel.   AKI with metabolic acidosis - Baseline creatinine is about 0.9-1.4 - Creatinine has been steadily rising peaked at 3.06 -Continue to hold losartan and torsemide - have asked for nephrology consult - renal ultrasound unremarkable - Cr is now  improving it is 2.03 today.  Acute blood loss anemia-anemia of chronic disease -Hemoglobin 7.7 today - transfused 1 U PRBC on 11/25 and again on 11/27 - anemia panel was obtained on 11/20-ferritin was 913 - iron saturation was low as was iron binding - IV Ferric gluconate given on 11/27 - cont to follow Hgb- has bloody drainage in wound vac and may need further transfusions    Type 2 diabetes mellitus ([place]) -Home medications include 70/30 and Glucophage -NovoLog sliding scale ordered -Hemoglobin A1C    Component Value Date/Time   HGBA1C 5.6 09/26/2022 0404   Hypothyroidism - Continue levothyroxine  Chronic diastolic heart failure/hypertension - Last echo from 2021 revealed grade 2 diastolic dysfunction with an EF of 55 to 60% - Diuretics currently on hold due to AKI -Continue metoprolol  History of coronary artery disease status post CABG - Continue aspirin and Lipitor  BPH - Continue Flomax        Code Status: Full Code Consultants: Orthopedic surgery Level of Care: Level of care: Telemetry Medical Total time on patient care: 35 DVT prophylaxis:  heparin injection 5,000 Units Start: 10/03/22 0600 SCDs Start: 10/03/22 0127     Objective:   Vitals:   10/08/22 0011 10/08/22 0326 10/08/22 0711 10/08/22 0804  BP: (!) 151/99 (!) 147/63    Pulse: 99 92  98  Resp: 19 (!) 21  20  Temp: 98 F (36.7 C) 98.3 F (36.8 C)  98.2 F (36.8 C)  TempSrc: Axillary   Axillary  SpO2: 98% 97% 98%   Weight:      Height:       Filed Weights   10/05/22 1026  Weight:  118 kg   Exam:    Awake, patient appears to be healing intermittently, but this appears to be unrelated to pain not answer any questions or follow any commands Symmetrical Chest wall movement, Good air movement bilaterally, CTAB RRR,No Gallops,Rubs or new Murmurs, No Parasternal Heave +ve B.Sounds, Abd Soft, No tenderness, No rebound - guarding or rigidity. No Cyanosis, Clubbing or edema, right  BKA     CBC: Recent Labs  Lab 10/02/22 1720 10/03/22 0519 10/04/22 0231 10/05/22 0816 10/06/22 0641 10/07/22 0551 10/08/22 0744  WBC 18.6* 17.8* 17.9* 17.5* 17.3* 13.7* 14.4*  NEUTROABS 14.2* 13.9* 14.1* 13.5* 13.7*  --   --   HGB 7.7* 7.5* 7.1* 7.4* 7.0* 7.5* 7.7*  HCT 24.0* 23.9* 21.7* 23.7* 21.4* 24.1* 24.0*  MCV 87.9 87.9 85.8 89.1 87.7 90.3 88.9  PLT 440* 464* 475* 489* 473* 528* 465*   Basic Metabolic Panel: Recent Labs  Lab 10/03/22 0519 10/04/22 0231 10/05/22 0816 10/06/22 0641 10/07/22 0551 10/08/22 0744  NA 136 134* 132* 136 138 143  K 3.8 3.9 4.2 5.0 4.8 4.3  CL 102 99 100 103 109 113*  CO2 22 19* 19* 19* 20* 15*  GLUCOSE 91 120* 114* 118* 89 132*  BUN 45* 49* 56* 62* 60* 43*  CREATININE 1.82* 2.33* 3.12* 3.06* 2.62* 2.03*  CALCIUM 8.6* 8.9 8.8* 9.1 9.0 9.2  MG 1.5* 2.0  --   --   --   --   PHOS  --   --   --   --  5.1*  --    GFR: Estimated Creatinine Clearance: 49.7 mL/min (A) (by C-G formula based on SCr of 2.03 mg/dL (H)).  Scheduled Meds:  amitriptyline  75 mg Oral QHS   aspirin EC  81 mg Oral Q breakfast   atorvastatin  80 mg Oral QHS   Chlorhexidine Gluconate Cloth  6 each Topical Q0600   heparin  5,000 Units Subcutaneous Q8H   insulin aspart  0-15 Units Subcutaneous TID WC   levothyroxine  75 mcg Oral QAC breakfast   metoprolol succinate  12.5 mg Oral BID   mometasone-formoterol  2 puff Inhalation BID   mupirocin ointment  1 Application Nasal BID   oxyCODONE  5 mg Oral Q4H   QUEtiapine  25 mg Oral BID   sodium bicarbonate  650 mg Oral BID   tamsulosin  0.8 mg Oral Daily   venlafaxine XR  225 mg Oral Daily   Continuous Infusions:  cefTRIAXone (ROCEPHIN)  IV 2 g (10/07/22 2204)   vancomycin 1,250 mg (10/08/22 0824)   Imaging and lab data was personally reviewed No results found.  LOS: 6 days   Author: Phillips Climes MD 10/08/2022 9:16 AM  To contact [facility]>   Check the care team in [facility] and look for the  attending/consulting [place] provider listed  Log into [email or web address] and use [facility]'s universal password   Go to> "[facility]"  and find provider  If you still have difficulty reaching the provider, please page the [facility] (Director on Call) for the Hospitalists listed on amion

## 2022-10-08 NOTE — Progress Notes (Signed)
[facility] Progress Note  Name: Shane Chambers MRN: [id number] DOB: [birth year]  Chief Complaint:  Right lower extremity pain and swelling  Subjective:  Pt had 3.9 liters UOP over 11/28 charted.  Spoke with primary team.  He has continued to be awake but agitated.     Review of systems:   Unable to obtain given AMS   ------------------ Background on consult:  Shane Chambers is a/an 61 y.o. male with a past medical history of DM2, CAD, CABG, COPD, HTN, PVD who presents with right lower extremity pain and swelling.  The patient is in significant pain postoperatively and has a hard time providing history.  History was mainly obtained per chart review. Patient was hospitalized from 11/16 until 11/22 with cellulitis of the right lower extremity.  He was treated with antibiotics and discharged with oral antibiotics.  The patient presented 2 days ago because he felt like his leg was getting worse and he was having more pain.  He was unable to ambulate easily because of it.  He also had some chills and possibly some fevers.  He does not remember taking any NSAIDs at his facility but he was on losartan.  He denied any chest pain, shortness of breath, nausea, vomiting, diarrhea, dysuria, hematuria. In the emergency department found to have leukocytosis and CT scan was concerning for pyomyositis and likely osteomyelitis.  Patient was started on antibiotics and orthopedics was consulted.  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  Patient is being treated with vancomycin and cefepime.  No obvious hypotensive episodes.  Baseline creatinine is likely around 1.4-1.8.  Creatinine was 1.8 on 11/24 and increased to 2.3 yesterday and now 3.1.  Patient's albumin is fairly low at 2.  No obvious hypotension with procedure today  Intake/Output Summary (Last 24 hours) at 10/08/2022 0919 Last data filed at 10/08/2022 0200 Gross per 24 hour  Intake 250.98 ml  Output 3850 ml  Net -3599.02 ml    Vitals:   Vitals:   10/08/22 0011 10/08/22 0326 10/08/22 0711 10/08/22 0804  BP: (!) 151/99 (!) 147/63    Pulse: 99 92  98  Resp: 19 (!) 21  20  Temp: 98 F (36.7 C) 98.3 F (36.8 C)  98.2 F (36.8 C)  TempSrc: Axillary   Axillary  SpO2: 98% 97% 98%   Weight:      Height:         Physical Exam:     General adult male in bed, nonverbal  HEENT normocephalic atraumatic extraocular movements intact sclera anicteric Neck supple trachea midline Lungs clear to auscultation bilaterally normal work of breathing at rest on room air Heart S1S2 no rub Abdomen soft nontender nondistended Extremities no pitting edema; right BKA with brace on  Neuro - agitated. Eyes are open slightly and he yells "ahhh" in response to any question or request   Medications reviewed   Labs:     Latest Ref Rng & Units 10/08/2022    7:44 AM 10/07/2022    5:51 AM 10/06/2022    6:41 AM  BMP  Glucose 70 - 99 mg/dL 132  89  118   BUN 6 - 20 mg/dL 43  60  62   Creatinine 0.61 - 1.24 mg/dL 2.03  2.62  3.06   Sodium 135 - 145 mmol/L 143  138  136   Potassium 3.5 - 5.1 mmol/L 4.3  4.8  5.0   Chloride 98 - 111 mmol/L 113  109  103   CO2  22 - 32 mmol/L '15  20  19   '$ Calcium 8.9 - 10.3 mg/dL 9.2  9.0  9.1      Assessment/Plan:   Shane Chambers is a/an 61 y.o. male with a past medical history DM2, CAD, CABG, COPD, HTN, PVD who presents with osteomyelitis c/b AKI   Non-Oliguric AKI on CKD 3b: Rising since admission.  Pre-renal insults of infection and had been on ARB and diuretic.  Could also be related to vancomycin. Note random vanc was 33 on 11/26.  Renal US normal echogenicity and no hydro.  UA with 0-5 RBC and 100 mg/dL protein - renal function is improving with supportive care   - Continue supportive care - LR at 75 ml/hr x 24 hours   - hold losartan   Osteomyelitis: As well as pyomyositis.  Status post right BKA.  Management per orthopedics and antibiotics per primary team   CKD stage 3b - note his baseline  Cr appears to be 1.4 - 1.8 per renal charting  Proteinuria   - 100 mg/dl protein noted back to 2021 UA on brief review - check up/cr ratio - this was ordered but not obtained.  Requested Urine be sent   AMS  - per primary team  - note he was on cefepime - he has been transitioned to another agent per primary team  - optimizing AKI as above  Hypertension: Holding losartan.  Continue other current regimen    Hyperkalemia - changed to renal diet; on bicarb (but can't take oral meds)  Hyponatremia: Mild; in the setting of AKI likely related to free water retention.   Metabolic acidosis: he is ordered sodium bicarbonate but not getting due to AMS. Ordered bicarb for TID for now for when can take PO. s/p IV fluids without bicarb.  For LR today as above.  Lactic acid normal last check   Uncontrolled Diabetes Mellitus Type 2 with Hyperglycemia: Management per primary   Anemia: Likely related to illness.  Transfusions per primary team  Disposition - would continue inpatient monitoring.  Per ortho note they anticipate discharge to SNF    Claudia Desanctis, MD 10/08/2022 9:40 AM

## 2022-10-08 NOTE — Plan of Care (Signed)
  Problem: Clinical Measurements: Goal: Ability to avoid or minimize complications of infection will improve Outcome: Progressing   Problem: Skin Integrity: Goal: Skin integrity will improve Outcome: Progressing   Problem: Education: Goal: Knowledge of General Education information will improve Description: Including pain rating scale, medication(s)/side effects and non-pharmacologic comfort measures Outcome: Progressing   Problem: Health Behavior/Discharge Planning: Goal: Ability to manage health-related needs will improve Outcome: Progressing   Problem: Clinical Measurements: Goal: Ability to maintain clinical measurements within normal limits will improve Outcome: Progressing Goal: Will remain free from infection Outcome: Progressing Goal: Diagnostic test results will improve Outcome: Progressing Goal: Respiratory complications will improve Outcome: Progressing Goal: Cardiovascular complication will be avoided Outcome: Progressing   Problem: Activity: Goal: Risk for activity intolerance will decrease Outcome: Progressing   Problem: Nutrition: Goal: Adequate nutrition will be maintained Outcome: Progressing   Problem: Coping: Goal: Level of anxiety will decrease Outcome: Progressing   Problem: Elimination: Goal: Will not experience complications related to bowel motility Outcome: Progressing Goal: Will not experience complications related to urinary retention Outcome: Progressing   Problem: Pain Managment: Goal: General experience of comfort will improve Outcome: Progressing   Problem: Safety: Goal: Ability to remain free from injury will improve Outcome: Progressing   Problem: Skin Integrity: Goal: Risk for impaired skin integrity will decrease Outcome: Progressing   Problem: Education: Goal: Ability to describe self-care measures that may prevent or decrease complications (Diabetes Survival Skills Education) will improve Outcome: Progressing Goal:  Individualized Educational Video(s) Outcome: Progressing   Problem: Coping: Goal: Ability to adjust to condition or change in health will improve Outcome: Progressing   Problem: Fluid Volume: Goal: Ability to maintain a balanced intake and output will improve Outcome: Progressing   Problem: Health Behavior/Discharge Planning: Goal: Ability to identify and utilize available resources and services will improve Outcome: Progressing Goal: Ability to manage health-related needs will improve Outcome: Progressing   Problem: Metabolic: Goal: Ability to maintain appropriate glucose levels will improve Outcome: Progressing   Problem: Nutritional: Goal: Maintenance of adequate nutrition will improve Outcome: Progressing Goal: Progress toward achieving an optimal weight will improve Outcome: Progressing   Problem: Skin Integrity: Goal: Risk for impaired skin integrity will decrease Outcome: Progressing   Problem: Tissue Perfusion: Goal: Adequacy of tissue perfusion will improve Outcome: Progressing   Problem: Education: Goal: Knowledge of the prescribed therapeutic regimen will improve Outcome: Progressing Goal: Ability to verbalize activity precautions or restrictions will improve Outcome: Progressing Goal: Understanding of discharge needs will improve Outcome: Progressing   Problem: Activity: Goal: Ability to perform//tolerate increased activity and mobilize with assistive devices will improve Outcome: Progressing   Problem: Clinical Measurements: Goal: Postoperative complications will be avoided or minimized Outcome: Progressing   Problem: Self-Care: Goal: Ability to meet self-care needs will improve Outcome: Progressing   Problem: Self-Concept: Goal: Ability to maintain and perform role responsibilities to the fullest extent possible will improve Outcome: Progressing   Problem: Pain Management: Goal: Pain level will decrease with appropriate interventions Outcome:  Progressing   Problem: Skin Integrity: Goal: Demonstration of wound healing without infection will improve Outcome: Progressing

## 2022-10-09 ENCOUNTER — Other Ambulatory Visit: Payer: Self-pay

## 2022-10-09 ENCOUNTER — Inpatient Hospital Stay (HOSPITAL_COMMUNITY): Payer: Medicare Other

## 2022-10-09 DIAGNOSIS — E870 Hyperosmolality and hypernatremia: Secondary | ICD-10-CM

## 2022-10-09 LAB — GLUCOSE, CAPILLARY
Glucose-Capillary: 150 mg/dL — ABNORMAL HIGH (ref 70–99)
Glucose-Capillary: 157 mg/dL — ABNORMAL HIGH (ref 70–99)
Glucose-Capillary: 163 mg/dL — ABNORMAL HIGH (ref 70–99)
Glucose-Capillary: 154 mg/dL — ABNORMAL HIGH (ref 70–99)

## 2022-10-09 LAB — CBC
HCT: 25.2 % — ABNORMAL LOW (ref 39.0–52.0)
Hemoglobin: 8.1 g/dL — ABNORMAL LOW (ref 13.0–17.0)
MCH: 28.9 pg (ref 26.0–34.0)
MCHC: 32.1 g/dL (ref 30.0–36.0)
MCV: 90 fL (ref 80.0–100.0)
Platelets: 634 10*3/uL — ABNORMAL HIGH (ref 150–400)
RBC: 2.8 MIL/uL — ABNORMAL LOW (ref 4.22–5.81)
RDW: 14.9 % (ref 11.5–15.5)
WBC: 12 10*3/uL — ABNORMAL HIGH (ref 4.0–10.5)
nRBC: 0 % (ref 0.0–0.2)

## 2022-10-09 LAB — BASIC METABOLIC PANEL
Anion gap: 14 (ref 5–15)
BUN: 33 mg/dL — ABNORMAL HIGH (ref 6–20)
CO2: 20 mmol/L — ABNORMAL LOW (ref 22–32)
Calcium: 9.7 mg/dL (ref 8.9–10.3)
Chloride: 117 mmol/L — ABNORMAL HIGH (ref 98–111)
Creatinine, Ser: 1.56 mg/dL — ABNORMAL HIGH (ref 0.61–1.24)
GFR, Estimated: 51 mL/min — ABNORMAL LOW (ref >60)
Glucose, Bld: 150 mg/dL — ABNORMAL HIGH (ref 70–99)
Potassium: 3.7 mmol/L (ref 3.5–5.1)
Sodium: 151 mmol/L — ABNORMAL HIGH (ref 135–145)

## 2022-10-09 LAB — SURGICAL PATHOLOGY

## 2022-10-09 LAB — PROCALCITONIN: Procalcitonin: <0.1 ng/mL

## 2022-10-09 MED ORDER — HALOPERIDOL LACTATE 5 MG/ML IJ SOLN: 1.0000 mg | Freq: Two times a day (BID) | INTRAMUSCULAR | Status: DC | PRN

## 2022-10-09 MED ORDER — HYDROMORPHONE HCL 1 MG/ML IJ SOLN: 0.5000 mg | INTRAMUSCULAR | Status: DC | PRN

## 2022-10-09 MED ORDER — THIAMINE HCL 100 MG/ML IJ SOLN
500.0000 mg | Freq: Every day | INTRAVENOUS | Status: DC
  Administered 2022-10-09 – 2022-10-12 (×4): 500 mg via INTRAVENOUS
  Filled 2022-10-09 (×7): qty 5

## 2022-10-09 MED ORDER — SODIUM CHLORIDE 0.9 % IV BOLUS
1000.0000 mL | Freq: Once | INTRAVENOUS | Status: AC
  Administered 2022-10-09: 1000 mL via INTRAVENOUS

## 2022-10-09 MED ORDER — AMITRIPTYLINE HCL 50 MG PO TABS
50.0000 mg | ORAL_TABLET | Freq: Once | ORAL | Status: DC
  Filled 2022-10-09: qty 1

## 2022-10-09 MED ORDER — DEXTROSE 5 % IV SOLN: INTRAVENOUS | Status: DC

## 2022-10-09 MED ORDER — VENLAFAXINE HCL 75 MG PO TABS
75.0000 mg | ORAL_TABLET | Freq: Once | ORAL | Status: DC
  Filled 2022-10-09: qty 1

## 2022-10-09 MED ORDER — CLONIDINE HCL 0.2 MG/24HR TD PTWK
0.2000 mg | MEDICATED_PATCH | TRANSDERMAL | Status: DC
  Administered 2022-10-09: 0.2 mg via TRANSDERMAL
  Filled 2022-10-09: qty 1

## 2022-10-09 MED ORDER — HALOPERIDOL LACTATE 5 MG/ML IJ SOLN: 1.0000 mg | Freq: Four times a day (QID) | INTRAMUSCULAR | Status: DC | PRN

## 2022-10-09 MED ORDER — METOPROLOL TARTRATE 5 MG/5ML IV SOLN
5.0000 mg | Freq: Four times a day (QID) | INTRAVENOUS | Status: DC
  Administered 2022-10-09: 5 mg via INTRAVENOUS
  Filled 2022-10-09: qty 5

## 2022-10-09 NOTE — Progress Notes (Signed)
[facility] Progress Note  Name: [name] MRN: [id number] DOB: [birth year]  Chief Complaint:  Right lower extremity pain and swelling  Subjective:  Pt had 1.1 liters UOP over 11/29 charted.  His mother is at bedside and she states that he is not at his baseline - she is concerned about his confusion/AMS.  Nurse has paged the primary team to let them know that she is here.    Review of systems:    Unable to obtain given AMS   ------------------ Background on consult:  Shane Chambers is a/an 61 y.o. male with a past medical history of DM2, CAD, CABG, COPD, HTN, PVD who presents with right lower extremity pain and swelling.  The patient is in significant pain postoperatively and has a hard time providing history.  History was mainly obtained per chart review. Patient was hospitalized from 11/16 until 11/22 with cellulitis of the right lower extremity.  He was treated with antibiotics and discharged with oral antibiotics.  The patient presented 2 days ago because he felt like his leg was getting worse and he was having more pain.  He was unable to ambulate easily because of it.  He also had some chills and possibly some fevers.  He does not remember taking any NSAIDs at his facility but he was on losartan.  He denied any chest pain, shortness of breath, nausea, vomiting, diarrhea, dysuria, hematuria. In the emergency department found to have leukocytosis and CT scan was concerning for pyomyositis and likely osteomyelitis.  Patient was started on antibiotics and orthopedics was consulted.  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  Patient is being treated with vancomycin and cefepime.  No obvious hypotensive episodes.  Baseline creatinine is likely around 1.4-1.8.  Creatinine was 1.8 on 11/24 and increased to 2.3 yesterday and now 3.1.  Patient's albumin is fairly low at 2.  No obvious hypotension with procedure today  Intake/Output Summary (Last 24 hours) at 10/09/2022 1120 Last data filed  at 10/09/2022 0500 Gross per 24 hour  Intake --  Output 1100 ml  Net -1100 ml    Vitals:  Vitals:   10/09/22 0000 10/09/22 0506 10/09/22 0753 10/09/22 0853  BP: (!) 184/84 (!) 170/80 (!) 199/99 (!) 188/95  Pulse: 98 96 96   Resp: '20 18 17   '$ Temp: 97.6 F (36.4 C) 98.2 F (36.8 C) 98.6 F (37 C)   TempSrc: Axillary Axillary Axillary   SpO2: 98% 100% 100%   Weight:      Height:         Physical Exam:      General adult male in bed, nonverbal  HEENT normocephalic atraumatic extraocular movements intact sclera anicteric Neck supple trachea midline Lungs clear to auscultation bilaterally normal work of breathing at rest on room air Heart S1S2 no rub Abdomen soft nontender nondistended Extremities no pitting edema; right BKA with brace on  Neuro - less agitated.  He does not respond to my questions. Does open his eyes once.  Mother is at bedside and he does not follow her commands or answer her questions during my exam    Medications reviewed   Labs:     Latest Ref Rng & Units 10/09/2022    6:37 AM 10/08/2022    7:44 AM 10/07/2022    5:51 AM  BMP  Glucose 70 - 99 mg/dL 150  132  89   BUN 6 - 20 mg/dL 33  43  60   Creatinine 0.61 - 1.24 mg/dL  1.56  2.03  2.62   Sodium 135 - 145 mmol/L 151  143  138   Potassium 3.5 - 5.1 mmol/L 3.7  4.3  4.8   Chloride 98 - 111 mmol/L 117  113  109   CO2 22 - 32 mmol/L '20  15  20   '$ Calcium 8.9 - 10.3 mg/dL 9.7  9.2  9.0      Assessment/Plan:   Shane Chambers is a/an 61 y.o. male with a past medical history DM2, CAD, CABG, COPD, HTN, PVD who presents with osteomyelitis c/b AKI   Non-Oliguric AKI on CKD 3b: Rising since admission.  Pre-renal insults of infection and had been on ARB and diuretic.  Could also be related to vancomycin. Note random vanc was 33 on 11/26.  Renal US normal echogenicity and no hydro.  UA with 0-5 RBC and 100 mg/dL protein - Resolving with supportive care - hold losartan   Osteomyelitis: As well as  pyomyositis.  Status post right BKA.  Management per orthopedics and antibiotics per primary team   CKD stage 3b - note his baseline Cr appears to be 1.4 - 1.8 per renal charting  Hypernatremia  - Free water deficit  - primary team is getting enteral access for enteric free water  - Start D5W at 75 ml/hr    Proteinuria   - 100 mg/dl protein noted back to 2021 UA on brief review.  Cancelled the up/cr ratio - still not collected - felt likely 2/2 DM  AMS  - per primary team  - note he previously was on cefepime - he has been transitioned to another agent per primary team  - optimizing AKI as above  Hypertension: Holding losartan.  Continue other current regimen    Hyperkalemia - improved   Metabolic acidosis: he is ordered sodium bicarbonate but not getting due to AMS. Will discontinue   Uncontrolled Diabetes Mellitus Type 2 with Hyperglycemia: Management per primary   Anemia: Likely related to illness.  Transfusions per primary team  Disposition - Nephrology will sign off.  Spoke with primary team.  Please do not hesitate to contact me with any questions  Claudia Desanctis, MD 10/09/2022 11:20 AM

## 2022-10-09 NOTE — Progress Notes (Signed)
Several attempts to insert NG tube were unsuccessful, so we will hold an NG tube for today, and plan for cortrak tomorrow. Phillips Climes MD

## 2022-10-09 NOTE — Progress Notes (Addendum)
[facility] Progress Note  Patient: Shane Chambers     ELF:[id number]  DOA: 10/02/2022   PCP: Caprice Renshaw, MD       Brief hospital course: This is a 61 year old male with diabetes mellitus, coronary artery disease status post CABG, COPD, hypertension, coronary artery disease, peripheral vascular disease who presents to the hospital with right leg pain and swelling. The patient was hospitalized from 11/16 through 11/22 for cellulitis of the right leg, treated with ceftriaxone and vancomycin and discharged with doxycycline and cefadroxil. Blood cultures grew Bacillus species which was felt to be a contaminant.  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   Subjective:   Patient unable to provide any reliable complaints, he appears to be delirious, screaming loudly intermittently, this appears to be unrelated to pain.  Assessment and Plan: Principal Problem:   Cellulitis-septic arthritis/osteomyelitis Severe post op pain - WBC noted to be 18.6 - Imaging with CT scan and MRI suggestive of right ankle septic arthritis with osteomyelitis.  - 11/26- s/p AKA - has been receiving vancomycin and cefepime - recommended to continue x 3 days post op per Dr Sharol Given -Cefepime has been changed to Rocephin due to encephalopathy.  unable to change to Zosyn- he is allergic to PCN -Nightly of antibiotics. -Screaming is most likely due to delirium/agitation more than an actual pain.  Fever - patient with temperature 100.4, will repeat blood cultures, will obtain chest x-ray, will check procalcitonin -Patient with tachycardia, hypertension, fever, he has not taking any of his Effexor or amitriptyline for last 3 days, unclear of the symptoms related to withdrawal, will insert NG tube and give 1 dose of amitriptyline and Effexor.  Delirium Acute metabolic encephalopathy -Certainly with some underlying vascular dementia, as has been living at nursing home for  last 2 years, with evidence of significant trophy and CVA on CT head obtained yesterday. -Patient is screaming/yelling intermittently, unable to answer any commands or follow any questions will obtain CT head. -Hospital bleeding contributing, will keep on as needed Haldol. -Minimize narcotics and sedative medications -Remains significantly encephalopathic, will request core track and tube feed. -Discussion regarding Effexor and amitriptyline which she did not take for last 3 days, may be contributing to encephalopathy as well, will give 1 dose today.  Hypernatremia -Started on D5W,    AKI with metabolic acidosis - Baseline creatinine is about 0.9-1.4 - Creatinine has been steadily rising peaked at 3.06 -Continue to hold losartan and torsemide - have asked for nephrology consult - renal ultrasound unremarkable - Cr is now improving  Acute blood loss anemia-anemia of chronic disease -Hemoglobin 7.7 today - transfused 1 U PRBC on 11/25 and again on 11/27 - anemia panel was obtained on 11/20-ferritin was 913 - iron saturation was low as was iron binding - IV Ferric gluconate given on 11/27 - cont to follow Hgb- has bloody drainage in wound vac and may need further transfusions    Type 2 diabetes mellitus ([place]) -Home medications include 70/30 and Glucophage -NovoLog sliding scale ordered -Hemoglobin A1C    Component Value Date/Time   HGBA1C 5.6 09/26/2022 0404   Hypothyroidism - Continue levothyroxine  Chronic diastolic heart failure/hypertension - Last echo from 2021 revealed grade 2 diastolic dysfunction with an EF of 55 to 60% - Diuretics currently on hold due to AKI -Continue metoprolol  History of coronary artery disease status post CABG - Continue aspirin and Lipitor  Hypertension -Blood pressure remains significantly elevated, oral intake is unreliable, he  is on scheduled IV metoprolol, as needed IV hydralazine, will start on clonidine patch  BPH - Continue  Flomax     Code Status: Full Code Consultants: Orthopedic surgery Level of Care: Level of care: Telemetry Medical Family communication: Discussed with mother at bedside DVT prophylaxis:  heparin injection 5,000 Units Start: 10/03/22 0600 SCDs Start: 10/03/22 0127     Objective:   Vitals:   10/09/22 0506 10/09/22 0753 10/09/22 0853 10/09/22 1100  BP: (!) 170/80 (!) 199/99 (!) 188/95 (!) 174/88  Pulse: 96 96  (!) 113  Resp: 18 17  (!) 21  Temp: 98.2 F (36.8 C) 98.6 F (37 C)  (!) 100.4 F (38 C)  TempSrc: Axillary Axillary  Axillary  SpO2: 100% 100%  100%  Weight:      Height:       Filed Weights   10/05/22 1026  Weight: 118 kg   Exam:    He is more more lethargic today, he wakes up, open his eyes intermittently then go back to sleep, frail, chronically ill-appearing.   Symmetrical Chest wall movement, diminished air entry at the bases RRR,No Gallops,Rubs or new Murmurs, No Parasternal Heave +ve B.Sounds, Abd Soft, No tenderness, No rebound - guarding or rigidity. No Cyanosis, Clubbing or edema, right BKA     CBC: Recent Labs  Lab 10/02/22 1720 10/03/22 0519 10/04/22 0231 10/05/22 0816 10/06/22 0641 10/07/22 0551 10/08/22 0744 10/09/22 0637  WBC 18.6* 17.8* 17.9* 17.5* 17.3* 13.7* 14.4* 12.0*  NEUTROABS 14.2* 13.9* 14.1* 13.5* 13.7*  --   --   --   HGB 7.7* 7.5* 7.1* 7.4* 7.0* 7.5* 7.7* 8.1*  HCT 24.0* 23.9* 21.7* 23.7* 21.4* 24.1* 24.0* 25.2*  MCV 87.9 87.9 85.8 89.1 87.7 90.3 88.9 90.0  PLT 440* 464* 475* 489* 473* 528* 617* 427*   Basic Metabolic Panel: Recent Labs  Lab 10/03/22 0519 10/04/22 0231 10/05/22 0816 10/06/22 0641 10/07/22 0551 10/08/22 0744 10/09/22 0637  NA 136 134* 132* 136 138 143 151*  K 3.8 3.9 4.2 5.0 4.8 4.3 3.7  CL 102 99 100 103 109 113* 117*  CO2 22 19* 19* 19* 20* 15* 20*  GLUCOSE 91 120* 114* 118* 89 132* 150*  BUN 45* 49* 56* 62* 60* 43* 33*  CREATININE 1.82* 2.33* 3.12* 3.06* 2.62* 2.03* 1.56*  CALCIUM 8.6* 8.9  8.8* 9.1 9.0 9.2 9.7  MG 1.5* 2.0  --   --   --   --   --   PHOS  --   --   --   --  5.1*  --   --    GFR: Estimated Creatinine Clearance: 64.6 mL/min (A) (by C-G formula based on SCr of 1.56 mg/dL (H)).  Scheduled Meds:  amitriptyline  75 mg Oral QHS   aspirin EC  81 mg Oral Q breakfast   atorvastatin  80 mg Oral QHS   Chlorhexidine Gluconate Cloth  6 each Topical Q0600   heparin  5,000 Units Subcutaneous Q8H   insulin aspart  0-15 Units Subcutaneous TID WC   levothyroxine  75 mcg Oral QAC breakfast   metoprolol succinate  12.5 mg Oral BID   mometasone-formoterol  2 puff Inhalation BID   mupirocin ointment  1 Application Nasal BID   oxyCODONE  5 mg Oral Q4H   tamsulosin  0.8 mg Oral Daily   venlafaxine XR  225 mg Oral Daily   Continuous Infusions:  thiamine (VITAMIN B1) injection     Imaging and lab data was  personally reviewed CT HEAD WO CONTRAST (5MM)  Result Date: 10/08/2022 CLINICAL DATA:  Delirium, altered mental status EXAM: CT HEAD WITHOUT CONTRAST TECHNIQUE: Contiguous axial images were obtained from the base of the skull through the vertex without intravenous contrast. RADIATION DOSE REDUCTION: This exam was performed according to the departmental dose-optimization program which includes automated exposure control, adjustment of the mA and/or kV according to patient size and/or use of iterative reconstruction technique. COMPARISON:  None Available. FINDINGS: Brain: There is diffuse parenchymal volume loss, relatively advanced in relation of the patient's given age. Remote lacunar infarct noted within the left caudate body. No evidence of acute infarction, hemorrhage, hydrocephalus, extra-axial collection or mass lesion/mass effect. Vascular: No hyperdense vessel or unexpected calcification. Skull: Normal. Negative for fracture or focal lesion. Sinuses/Orbits: No acute finding. Other: Mastoid air cells and middle ear cavities are clear. IMPRESSION: 1. No acute intracranial  abnormality. 2. Diffuse parenchymal volume loss, relatively advanced in relation of the patient's given age. 3. Remote lacunar infarct within the left caudate body. Electronically Signed   By: Fidela Salisbury M.D.   On: 10/08/2022 11:02    LOS: 7 days   Author: Phillips Climes MD 10/09/2022 1:44 PM  To contact [facility]>   Check the care team in [facility] and look for the attending/consulting [facility] provider listed  Log into [email or web address] and use 's universal password   Go to> "[facility]"  and find provider  If you still have difficulty reaching the provider, please page the [facility] (Director on Call) for the Hospitalists listed on amion

## 2022-10-09 NOTE — Progress Notes (Signed)
Initial Nutrition Assessment  DOCUMENTATION CODES:   Not applicable  INTERVENTION:   Once Cortrak is placed, recommend stating tube feeds: Initiate Jevity 1.5 at 20 mL/hr and advance by 10 mL every 6 hours to goal rate of 60 mL/hr  60 mL ProSource TF20 - daily 185 mL free water flush q4h Provides 2240 kcal, 12 gm protein, and 2204 mL total free water daily. Liberalize pt diet to regular due to inadequate oral intake and lab levels within range. Continue with 1800 mL free water restriction Recommend scheduling a bowel regimen due to no BM in > 7 days  NUTRITION DIAGNOSIS:   Increased nutrient needs related to wound healing as evidenced by estimated needs.  GOAL:   Patient will meet greater than or equal to 90% of their needs  MONITOR:   TF tolerance, I & O's, Skin, PO intake  REASON FOR ASSESSMENT:   Consult Enteral/tube feeding initiation and management  ASSESSMENT:   61 y.o. male presented to the ED with worsening cellulitis. PMH includes COPD, CAD, T2DM, GERD, HTN, PAD, anemia, hypothyroidism, and CHF. Pt admitted with cellulitis, osteomyelitis, and AKI.   11/30 - R BKA, wound VAC placed  Pt laying in bed, arms contracted. Pt opens eyes to RD voice but does not answer RD. Per EMR, pt has had a 12.7% weight loss within 9 months. Although, unable to determine actual weight loss from fluid loss due to chronic conditions.   Per notes, pt with increased lethargy. MD wants to proceed with Cortrak placement.    Medications reviewed and include: NovoLog SSI, IV thiamine Labs reviewed: Sodium 151, BUN 33, Creatinine 1.56, CRP 33.7 (11/23), Hgb A1c 5.6%, 24 hr CBGs 142-157  NUTRITION - FOCUSED PHYSICAL EXAM: Deferred to follow-up due to pt contractions.  Diet Order:   Diet Order             Diet regular Room service appropriate? Yes with Assist; Fluid consistency: Thin; Fluid restriction: 1800 mL Fluid  Diet effective now                   EDUCATION NEEDS:    Not appropriate for education at this time  Skin:  Skin Assessment: Skin Integrity Issues: Skin Integrity Issues:: Stage I, Incisions Stage I: Buttocks Incisions: R Leg  Last BM:  11/22  Height:   Ht Readings from Last 1 Encounters:  10/05/22 5' 9.02" (1.753 m)    Weight:   Wt Readings from Last 1 Encounters:  10/05/22 118 kg    Ideal Body Weight:  52.7 kg  BMI:  Body mass index is 38.4 kg/m.  Estimated Nutritional Needs:  Kcal:  2200-2400 Protein:  110-130 grams Fluid:  >/= 2 L   Hermina Barters RD, LDN Clinical Dietitian See [facility] for contact information.

## 2022-10-09 NOTE — Progress Notes (Signed)
Pt mother Estill Bamberg at bedside requesting to speak with MD for an update of care for pt. Elgergawy, MD notified of request.

## 2022-10-09 NOTE — Progress Notes (Signed)
Physical Therapy Evaluation Patient Details Name: Shane Chambers MRN: [id number] DOB: [birth year] Today's Date: 10/09/2022  History of Present Illness  Pt is a 61 yo male admitted after recent hospitalization for cellulitis from 11/16-11/22. Pt sent home with antibiotics but returned with leg pain.  Pt underwent a R BKA and has had some delirium since and being followed by nephrology. CT scan with no acute abnomality.  PMH: DM, CAD, CABG, COPD, HTN, PVD, AKI.  Clinical Impression  Pt is seen with limitations imposed by current cognitive status, with encephalopathy and delirium making pt non communicative.  Even so, he is actively resisting attempts to roll and move him.  This combined challenge with communication and movement is making pt look appropriate for SNF request, but this was also his PLOF.  Pt has been unable to share functional level so will work on mobility as is appropriate for his current functional level.  Pt would also be anticipated to have access to full PT and OT services upon discharge to this setting.      Recommendations for follow up therapy are one component of a multi-disciplinary discharge planning process, led by the attending physician.  Recommendations may be updated based on patient status, additional functional criteria and insurance authorization.  Follow Up Recommendations Skilled nursing-short term rehab (<3 hours/day) Can patient physically be transported by private vehicle: No    Assistance Recommended at Discharge Frequent or constant Supervision/Assistance  Patient can return home with the following  Two people to help with walking and/or transfers;Two people to help with bathing/dressing/bathroom;Direct supervision/assist for medications management;Assist for transportation;Direct supervision/assist for financial management;Help with stairs or ramp for entrance    Equipment Recommendations None recommended by PT  Recommendations for Other Services        Functional Status Assessment Patient has had a recent decline in their functional status and/or demonstrates limited ability to make significant improvements in function in a reasonable and predictable amount of time     Precautions / Restrictions Precautions Precautions: Fall Precaution Comments: non verbal and actively resists moving Required Braces or Orthoses: Splint/Cast (limb protector) Splint/Cast: For R BKA Restrictions Weight Bearing Restrictions: No      Mobility  Bed Mobility Overal bed mobility: Needs Assistance Bed Mobility: Rolling, Supine to Sit Rolling: Total assist   Supine to sit: Total assist     General bed mobility comments: deferred to side of bed due to pt actively resisting all movement initially    Transfers                   General transfer comment: unable to attempt    Ambulation/Gait                  Stairs            Wheelchair Mobility    Modified Rankin (Stroke Patients Only)       Balance Overall balance assessment: Needs assistance Sitting-balance support:  (unable to get to sitting posture)                                         Pertinent Vitals/Pain Pain Assessment Pain Assessment: Faces Faces Pain Scale: Hurts little more Breathing: occasional labored breathing, short period of hyperventilation Facial Expression: facial grimacing Body Language: tense, distressed pacing, fidgeting Consolability: unable to console, distract or reassure Pain Location: assumed RLE Pain Descriptors / Indicators: Grimacing,  Guarding Pain Intervention(s): Limited activity within patient's tolerance, Monitored during session, Repositioned    Home Living Family/patient expects to be discharged to:: Skilled nursing facility                   Additional Comments: Pt lives in [place] facility    Prior Function Prior Level of Function : Needs assist       Physical Assist : Mobility  (physical) Mobility (physical): Gait   Mobility Comments: Pt was mod I with transfers and walked short distances with RW in June 2023. Unsure at this time. ADLs Comments: likely unchanged from June and requires help from SNF staff     Hand Dominance   Dominant Hand: Right    Extremity/Trunk Assessment   Upper Extremity Assessment Upper Extremity Assessment: Generalized weakness    Lower Extremity Assessment Lower Extremity Assessment: Generalized weakness    Cervical / Trunk Assessment Cervical / Trunk Assessment: Normal  Communication   Communication: Expressive difficulties (non verbal)  Cognition Arousal/Alertness: Lethargic Behavior During Therapy: Anxious, Flat affect Overall Cognitive Status: Impaired/Different from baseline Area of Impairment: Orientation, Attention, Following commands, Safety/judgement, Awareness                 Orientation Level: Person, Place, Time, Situation Current Attention Level: Selective   Following Commands:  (not following commands)                General Comments General comments (skin integrity, edema, etc.): pt is minimally able to assist and is actively resisting PT trying to move him.  Pt is likely in some level of discomfort but cannot verbalize it.    Exercises     Assessment/Plan    PT Assessment Patient needs continued PT services  PT Problem List Decreased strength;Decreased range of motion;Decreased activity tolerance;Decreased balance;Decreased mobility;Decreased coordination;Decreased cognition;Decreased safety awareness;Decreased knowledge of precautions;Pain;Decreased skin integrity;Obesity       PT Treatment Interventions DME instruction;Therapeutic activities;Therapeutic exercise;Functional mobility training;Balance training;Neuromuscular re-education;Patient/family education    PT Goals (Current goals can be found in the Care Plan section)  Acute Rehab PT Goals Patient Stated Goal: non verbal PT  Goal Formulation: Patient unable to participate in goal setting Time For Goal Achievement: 10/23/22 Potential to Achieve Goals: Fair    Frequency Min 2X/week     Co-evaluation               AM-PAC PT "6 Clicks" Mobility  Outcome Measure Help needed turning from your back to your side while in a flat bed without using bedrails?: Total Help needed moving from lying on your back to sitting on the side of a flat bed without using bedrails?: Total Help needed moving to and from a bed to a chair (including a wheelchair)?: Total Help needed standing up from a chair using your arms (e.g., wheelchair or bedside chair)?: Total Help needed to walk in hospital room?: Total Help needed climbing 3-5 steps with a railing? : Total 6 Click Score: 6    End of Session Equipment Utilized During Treatment: Oxygen Activity Tolerance: Treatment limited secondary to medical complications (Comment) Patient left: in bed;with call bell/phone within reach;with bed alarm set Nurse Communication: Mobility status PT Visit Diagnosis: Muscle weakness (generalized) (M62.81);Difficulty in walking, not elsewhere classified (R26.2);Other abnormalities of gait and mobility (R26.89)    Time: 0240-9735 PT Time Calculation (min) (ACUTE ONLY): 20 min   Charges:   PT Evaluation $PT Eval Moderate Complexity: 1 Mod         Rod Holler  Bronson Curb 10/09/2022, 3:07 PM  Mee Hives, PT PhD Acute Rehab Dept. Number: [place] and [place]

## 2022-10-09 NOTE — Progress Notes (Signed)
   10/09/22 2332  Assess: MEWS Score  Temp 99.3 F (37.4 C)  BP (!) 198/102  MAP (mmHg) 129  Pulse Rate (!) 112  ECG Heart Rate (!) 113  Resp (!) 29  SpO2 97 %  O2 Device Room Air  Assess: MEWS Score  MEWS Temp 0  MEWS Systolic 0  MEWS Pulse 2  MEWS RR 2  MEWS LOC 0  MEWS Score 4  MEWS Score Color Red  Assess: if the MEWS score is Yellow or Red  Were vital signs taken at a resting state? Yes  Focused Assessment No change from prior assessment  Does the patient meet 2 or more of the SIRS criteria? Yes  Does the patient have a confirmed or suspected source of infection? Yes  Provider and Rapid Response Notified? Yes  Treat  MEWS Interventions Escalated (See documentation below)  Pain Score Asleep  Take Vital Signs  Increase Vital Sign Frequency  Red: Q 1hr X 4 then Q 4hr X 4, if remains red, continue Q 4hrs  Escalate  MEWS: Escalate Red: discuss with charge nurse/RN and provider, consider discussing with RRT  Notify: Charge Nurse/RN  Name of Charge Nurse/RN Notified Matt, RN  Date Charge Nurse/RN Notified 10/09/22  Time Charge Nurse/RN Notified 2347  Provider Notification  Provider Name/Title Howerter, MD  Date Provider Notified 10/09/22  Time Provider Notified 2347  Method of Notification Page  Document  Patient Outcome Not stable and remains on department  Progress note created (see row info) Yes  Assess: SIRS CRITERIA  SIRS Temperature  0  SIRS Pulse 1  SIRS Respirations  1  SIRS WBC 1  SIRS Score Sum  3

## 2022-10-10 ENCOUNTER — Inpatient Hospital Stay (HOSPITAL_COMMUNITY): Payer: Medicare Other

## 2022-10-10 ENCOUNTER — Other Ambulatory Visit: Payer: Self-pay | Admitting: Internal Medicine

## 2022-10-10 DIAGNOSIS — N179 Acute kidney failure, unspecified: Secondary | ICD-10-CM | POA: Diagnosis not present

## 2022-10-10 DIAGNOSIS — R4182 Altered mental status, unspecified: Secondary | ICD-10-CM

## 2022-10-10 DIAGNOSIS — M86271 Subacute osteomyelitis, right ankle and foot: Secondary | ICD-10-CM | POA: Diagnosis not present

## 2022-10-10 LAB — BASIC METABOLIC PANEL
Anion gap: 16 — ABNORMAL HIGH (ref 5–15)
Anion gap: 12 (ref 5–15)
BUN: 29 mg/dL — ABNORMAL HIGH (ref 6–20)
BUN: 31 mg/dL — ABNORMAL HIGH (ref 6–20)
CO2: 20 mmol/L — ABNORMAL LOW (ref 22–32)
CO2: 20 mmol/L — ABNORMAL LOW (ref 22–32)
Calcium: 9.5 mg/dL (ref 8.9–10.3)
Calcium: 10.3 mg/dL (ref 8.9–10.3)
Chloride: 116 mmol/L — ABNORMAL HIGH (ref 98–111)
Chloride: 120 mmol/L — ABNORMAL HIGH (ref 98–111)
Creatinine, Ser: 1.37 mg/dL — ABNORMAL HIGH (ref 0.61–1.24)
Creatinine, Ser: 1.35 mg/dL — ABNORMAL HIGH (ref 0.61–1.24)
GFR, Estimated: 59 mL/min — ABNORMAL LOW (ref >60)
GFR, Estimated: >60 mL/min (ref >60)
Glucose, Bld: 191 mg/dL — ABNORMAL HIGH (ref 70–99)
Glucose, Bld: 231 mg/dL — ABNORMAL HIGH (ref 70–99)
Potassium: 3.3 mmol/L — ABNORMAL LOW (ref 3.5–5.1)
Potassium: 4 mmol/L (ref 3.5–5.1)
Sodium: 152 mmol/L — ABNORMAL HIGH (ref 135–145)
Sodium: 152 mmol/L — ABNORMAL HIGH (ref 135–145)

## 2022-10-10 LAB — BLOOD GAS, ARTERIAL
Acid-Base Excess: 0.4 mmol/L (ref 0.0–2.0)
Bicarbonate: 22.6 mmol/L (ref 20.0–28.0)
Drawn by: 164
O2 Saturation: 97.9 %
Patient temperature: 37
pCO2 arterial: 29 mmHg — ABNORMAL LOW (ref 32–48)
pH, Arterial: 7.5 — ABNORMAL HIGH (ref 7.35–7.45)
pO2, Arterial: 98 mmHg (ref 83–108)

## 2022-10-10 LAB — GLUCOSE, CAPILLARY
Glucose-Capillary: 182 mg/dL — ABNORMAL HIGH (ref 70–99)
Glucose-Capillary: 185 mg/dL — ABNORMAL HIGH (ref 70–99)
Glucose-Capillary: 195 mg/dL — ABNORMAL HIGH (ref 70–99)
Glucose-Capillary: 198 mg/dL — ABNORMAL HIGH (ref 70–99)
Glucose-Capillary: 208 mg/dL — ABNORMAL HIGH (ref 70–99)

## 2022-10-10 LAB — PROCALCITONIN: Procalcitonin: <0.1 ng/mL

## 2022-10-10 LAB — CBC
HCT: 28.6 % — ABNORMAL LOW (ref 39.0–52.0)
Hemoglobin: 9.2 g/dL — ABNORMAL LOW (ref 13.0–17.0)
MCH: 28.7 pg (ref 26.0–34.0)
MCHC: 32.2 g/dL (ref 30.0–36.0)
MCV: 89.1 fL (ref 80.0–100.0)
Platelets: 667 10*3/uL — ABNORMAL HIGH (ref 150–400)
RBC: 3.21 MIL/uL — ABNORMAL LOW (ref 4.22–5.81)
RDW: 15.3 % (ref 11.5–15.5)
WBC: 13.9 10*3/uL — ABNORMAL HIGH (ref 4.0–10.5)
nRBC: 0 % (ref 0.0–0.2)

## 2022-10-10 LAB — MAGNESIUM: Magnesium: 1.7 mg/dL (ref 1.7–2.4)

## 2022-10-10 LAB — PHOSPHORUS: Phosphorus: 2.5 mg/dL (ref 2.5–4.6)

## 2022-10-10 MED ORDER — METOPROLOL TARTRATE 50 MG PO TABS
75.0000 mg | ORAL_TABLET | Freq: Two times a day (BID) | ORAL | Status: DC
  Administered 2022-10-10 – 2022-10-12 (×5): 75 mg
  Filled 2022-10-10 (×5): qty 1

## 2022-10-10 MED ORDER — MAGNESIUM SULFATE IN D5W 1-5 GM/100ML-% IV SOLN
1.0000 g | Freq: Once | INTRAVENOUS | Status: AC
  Administered 2022-10-10: 1 g via INTRAVENOUS
  Filled 2022-10-10: qty 100

## 2022-10-10 MED ORDER — JEVITY 1.5 CAL/FIBER PO LIQD
1000.0000 mL | ORAL | Status: DC
  Administered 2022-10-10 – 2022-10-13 (×3): 1000 mL
  Filled 2022-10-10 (×7): qty 1000

## 2022-10-10 MED ORDER — LABETALOL HCL 5 MG/ML IV SOLN
10.0000 mg | INTRAVENOUS | Status: DC | PRN
  Administered 2022-10-10 – 2022-10-17 (×4): 10 mg via INTRAVENOUS
  Filled 2022-10-10 (×4): qty 4

## 2022-10-10 MED ORDER — FREE WATER
200.0000 mL | Status: DC
  Administered 2022-10-10 (×2): 200 mL

## 2022-10-10 MED ORDER — INSULIN ASPART 100 UNIT/ML IJ SOLN
0.0000 [IU] | INTRAMUSCULAR | Status: DC
  Administered 2022-10-10: 3 [IU] via SUBCUTANEOUS
  Administered 2022-10-10: 5 [IU] via SUBCUTANEOUS
  Administered 2022-10-11: 3 [IU] via SUBCUTANEOUS
  Administered 2022-10-11: 2 [IU] via SUBCUTANEOUS
  Administered 2022-10-11 (×3): 3 [IU] via SUBCUTANEOUS
  Administered 2022-10-12 (×2): 5 [IU] via SUBCUTANEOUS
  Administered 2022-10-12: 2 [IU] via SUBCUTANEOUS
  Administered 2022-10-12 – 2022-10-13 (×5): 3 [IU] via SUBCUTANEOUS
  Administered 2022-10-13 – 2022-10-14 (×5): 2 [IU] via SUBCUTANEOUS
  Administered 2022-10-15 (×4): 3 [IU] via SUBCUTANEOUS
  Administered 2022-10-15 (×2): 2 [IU] via SUBCUTANEOUS
  Administered 2022-10-16 (×4): 3 [IU] via SUBCUTANEOUS
  Administered 2022-10-16: 5 [IU] via SUBCUTANEOUS
  Administered 2022-10-16 – 2022-10-17 (×3): 3 [IU] via SUBCUTANEOUS
  Administered 2022-10-17: 5 [IU] via SUBCUTANEOUS
  Administered 2022-10-17: 3 [IU] via SUBCUTANEOUS
  Administered 2022-10-17: 2 [IU] via SUBCUTANEOUS

## 2022-10-10 MED ORDER — POTASSIUM PHOSPHATES 15 MMOLE/5ML IV SOLN
15.0000 mmol | Freq: Once | INTRAVENOUS | Status: AC
  Filled 2022-10-10: qty 5

## 2022-10-10 MED ORDER — VENLAFAXINE HCL ER 75 MG PO CP24
150.0000 mg | ORAL_CAPSULE | Freq: Every day | ORAL | Status: DC
  Administered 2022-10-11: 150 mg via ORAL
  Filled 2022-10-10: qty 2

## 2022-10-10 MED ORDER — PROSOURCE TF20 ENFIT COMPATIBL EN LIQD
60.0000 mL | Freq: Every day | ENTERAL | Status: DC
  Administered 2022-10-10 – 2022-10-17 (×8): 60 mL
  Filled 2022-10-10 (×8): qty 60

## 2022-10-10 MED ORDER — IPRATROPIUM-ALBUTEROL 0.5-2.5 (3) MG/3ML IN SOLN
3.0000 mL | Freq: Four times a day (QID) | RESPIRATORY_TRACT | Status: DC
  Administered 2022-10-10 (×3): 3 mL via RESPIRATORY_TRACT
  Filled 2022-10-10 (×3): qty 3

## 2022-10-10 MED ORDER — FREE WATER
300.0000 mL | Status: DC
  Administered 2022-10-10 – 2022-10-11 (×3): 300 mL

## 2022-10-10 MED ORDER — LEVOTHYROXINE SODIUM 75 MCG PO TABS
75.0000 ug | ORAL_TABLET | Freq: Every day | ORAL | Status: DC
  Administered 2022-10-11 – 2022-10-13 (×3): 75 ug
  Filled 2022-10-10 (×3): qty 1

## 2022-10-10 MED ORDER — AMLODIPINE BESYLATE 10 MG PO TABS
10.0000 mg | ORAL_TABLET | Freq: Every day | ORAL | Status: DC
  Administered 2022-10-10 – 2022-10-13 (×4): 10 mg
  Filled 2022-10-10 (×4): qty 1

## 2022-10-10 MED ORDER — OXYCODONE HCL 5 MG PO TABS
5.0000 mg | ORAL_TABLET | ORAL | Status: DC
  Administered 2022-10-10 – 2022-10-13 (×13): 5 mg
  Filled 2022-10-10 (×12): qty 1

## 2022-10-10 MED ORDER — ARFORMOTEROL TARTRATE 15 MCG/2ML IN NEBU
15.0000 ug | INHALATION_SOLUTION | Freq: Two times a day (BID) | RESPIRATORY_TRACT | Status: DC
  Administered 2022-10-10 – 2022-10-17 (×15): 15 ug via RESPIRATORY_TRACT
  Filled 2022-10-10 (×16): qty 2

## 2022-10-10 MED ORDER — POTASSIUM CHLORIDE 10 MEQ/100ML IV SOLN
10.0000 meq | INTRAVENOUS | Status: AC
  Administered 2022-10-10 (×4): 10 meq via INTRAVENOUS
  Filled 2022-10-10 (×4): qty 100

## 2022-10-10 MED ORDER — FREE WATER
250.0000 mL | Freq: Once | Status: AC
  Administered 2022-10-10: 250 mL

## 2022-10-10 MED ORDER — METRONIDAZOLE 500 MG/100ML IV SOLN
500.0000 mg | Freq: Two times a day (BID) | INTRAVENOUS | Status: DC
  Administered 2022-10-10 – 2022-10-12 (×5): 500 mg via INTRAVENOUS
  Filled 2022-10-10 (×5): qty 100

## 2022-10-10 MED ORDER — HYDROCODONE-ACETAMINOPHEN 5-325 MG PO TABS: 1.0000 | ORAL_TABLET | Freq: Four times a day (QID) | ORAL | Status: DC | PRN

## 2022-10-10 MED ORDER — SODIUM CHLORIDE 0.9 % IV SOLN
2.0000 g | INTRAVENOUS | Status: AC
  Administered 2022-10-10 – 2022-10-14 (×5): 2 g via INTRAVENOUS
  Filled 2022-10-10 (×5): qty 20

## 2022-10-10 MED ORDER — ATORVASTATIN CALCIUM 80 MG PO TABS
80.0000 mg | ORAL_TABLET | Freq: Every day | ORAL | Status: DC
  Administered 2022-10-10 – 2022-10-12 (×3): 80 mg
  Filled 2022-10-10 (×2): qty 1

## 2022-10-10 MED ORDER — METOPROLOL TARTRATE 5 MG/5ML IV SOLN
5.0000 mg | INTRAVENOUS | Status: DC
  Administered 2022-10-10 (×2): 5 mg via INTRAVENOUS
  Filled 2022-10-10: qty 5

## 2022-10-10 MED ORDER — BUDESONIDE 0.25 MG/2ML IN SUSP
0.2500 mg | Freq: Two times a day (BID) | RESPIRATORY_TRACT | Status: DC
  Administered 2022-10-10 – 2022-10-17 (×15): 0.25 mg via RESPIRATORY_TRACT
  Filled 2022-10-10 (×16): qty 2

## 2022-10-10 NOTE — Progress Notes (Addendum)
Nutrition Brief Note  Pt is s/p cortrak placement this AM. Cortrak placement confirmed with x ray. Spoke with MD about free water flush. Pt is behind on fluid. Calculated free water deficit of 2.8L. Will provide 270m FWF q 4hrs to provide 12078mfree water/day. As EN advances monitor labs and adjust FWF as clinically appropriate. Pt appears to be refeeding with the start of D5 yesterday, lytes repleted today. Recommend slow advance of EN, q 12hrs. Pt already started on thiamine.   Labs reviewed: Na:152, K:3.3(repleted), BG:154-191, BUN:29, Cr:1.37, Phos:2.5, Mg:1.7  Estimated Nutritional Needs:  Kcal:  2200-2400 Protein:  110-130 grams Fluid:  >/= 2 L  Interventions: Tube feeding via cortrak: Initiate Jevity 1.5 at 20 mL/hr and advance by 10 mL every 12 hours to goal rate of 60 mL/hr x 24hrs (144072motal volume) Provide 60 mL ProSource TF20 daily 200 mL free water flush q4h to provide an additional 1200m52mee water/day. Monitor labs as tube feeding advances and decrease FWF as clinically appropriate. Provides 2240kcal, 112g protein, and 2294mL44me water. Please check lytes q 12-24hrs to monitor for refeeding. Replete prn Provide regular diet as desired Recommend bowel regimen if no BM in next 24hrs.  KatieCandise Bowens RD, LDN, CNSC See [facility] for contact information

## 2022-10-10 NOTE — Progress Notes (Signed)
TR[facility] night cross cover note:   I was notified by patient's RN that the machine associated with the patient's wound vac is consistently conveying the presence of a blockage; following of existing protocols as well as independent trouble-shooting have been unsuccessful in resolving this issue. Wound care RN unavailable overnight. I have placed consult with the wound care team for the morning to assist with the above.     Babs Bertin, DO Hospitalist

## 2022-10-10 NOTE — Progress Notes (Signed)
[facility] Progress Note  Patient: Shane Chambers     SKA:[id number]  DOA: 10/02/2022   PCP: Caprice Renshaw, MD       Brief hospital course: This is a 61 year old male with diabetes mellitus, coronary artery disease status post CABG, COPD, hypertension, coronary artery disease, peripheral vascular disease who presents to the hospital with right leg pain and swelling. The patient was hospitalized from 11/16 through 11/22 for cellulitis of the right leg, treated with ceftriaxone and vancomycin and discharged with doxycycline and cefadroxil. Blood cultures grew Bacillus species which was felt to be a contaminant.  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  Patient hospital course was complicated by significant encephalopathy and altered mental status.   Subjective:   No significant events overnight, patient is altered, unable to provide any reliable complaints.   Assessment and Plan: Principal Problem: Cellulitis-septic arthritis/osteomyelitis - WBC noted to be 18.6 - Imaging with CT scan and MRI suggestive of right ankle septic arthritis with osteomyelitis.  - 11/26- s/p AKA - has been receiving vancomycin and cefepime - recommended to continue x 3 days post op per Dr Sharol Given -Cefepime has been changed to Rocephin due to encephalopathy.  unable to change to Zosyn- he is allergic to PCN  Fever/PNA - patient with temperature 100.4 11/30, chest x-ray significant for right upper lung opacity, will start on Rocephin and azithromycin for up pneumonia.  -Patient with tachycardia, hypertension, fever, he has not taking any of his Effexor or amitriptyline for last 3 days, unclear of the symptoms related to withdrawal, will insert NG tube and give 1 dose of amitriptyline and Effexor.  Delirium Acute metabolic encephalopathy -Certainly with some underlying vascular dementia, as has been living at nursing home for last 2 years, with evidence of  significant trophy and CVA on CT head obtained yesterday. -Patient is screaming/yelling intermittently, unable to answer any commands or follow any questions . -Hospital delirium contributing, will keep on as needed Haldol. -Minimize narcotics and sedative medications -Remains significantly encephalopathic, inserted core track for tube feed -Discussion regarding Effexor and amitriptyline which she did not take for last 3 day -Patient with tachycardia, hypertension, fever, he has not taking any of his Effexor or amitriptyline for last 3 days, unclear of the symptoms related to withdrawal, resumed back on these meds after core track tube was inserted today.  Hypernatremia -Started on D5W, and free water via tube as well   AKI with metabolic acidosis - Baseline creatinine is about 0.9-1.4 - Creatinine has been steadily rising peaked at 3.06 -Continue to hold losartan and torsemide - have asked for nephrology consult - renal ultrasound unremarkable - Cr is now improving  Acute blood loss anemia-anemia of chronic disease -Hemoglobin 7.7 today - transfused 1 U PRBC on 11/25 and again on 11/27 - anemia panel was obtained on 11/20-ferritin was 913 - iron saturation was low as was iron binding - IV Ferric gluconate given on 11/27 - cont to follow Hgb- has bloody drainage in wound vac and may need further transfusions    Type 2 diabetes mellitus ([place]) -Home medications include 70/30 and Glucophage -NovoLog sliding scale ordered -Hemoglobin A1C    Component Value Date/Time   HGBA1C 5.6 09/26/2022 0404   Hypothyroidism - Continue levothyroxine  Chronic diastolic heart failure/hypertension - Last echo from 2021 revealed grade 2 diastolic dysfunction with an EF of 55 to 60% - Diuretics currently on hold due to AKI -Continue metoprolol  History of coronary artery disease  status post CABG - Continue aspirin and Lipitor  Hypertension -Blood pressure remains significantly elevated, but on  clonidine patch as oral intake unreliable, now he has feeding tube will start on scheduled metoprolol as well, continue with as needed hydralazine and metoprolol.   BPH - Continue Flomax  Hypokalemia - repleted, recheck in am.     Code Status: Full Code Consultants: Orthopedic surgery Level of Care: Level of care: Telemetry Medical Family communication: Discussed with mother at bedside 11/30 DVT prophylaxis:  heparin injection 5,000 Units Start: 10/03/22 0600 SCDs Start: 10/03/22 0127     Objective:   Vitals:   10/10/22 0524 10/10/22 0740 10/10/22 0800 10/10/22 1052  BP: (!) 175/86 (!) 183/99 (!) 177/113 (!) 180/100  Pulse: 79 (!) 107  (!) 105  Resp: (!) 29 (!) 29  20  Temp:  98 F (36.7 C)    TempSrc:  Axillary    SpO2: 95% 96%  95%  Weight:      Height:       Filed Weights   10/05/22 1026  Weight: 118 kg   Exam:   Lethargic, open eyes intermittently, does not answer any questions or follow commands, he is extremely frail, chronically ill-appearing . Symmetrical Chest wall movement, Good air movement bilaterally, CTAB RRR,No Gallops,Rubs or new Murmurs, No Parasternal Heave +ve B.Sounds, Abd Soft, No tenderness, No rebound - guarding or rigidity. No Cyanosis, Clubbing or edema, right BKA    CBC: Recent Labs  Lab 10/04/22 0231 10/05/22 0816 10/06/22 0641 10/07/22 0551 10/08/22 0744 10/09/22 0637 10/10/22 0456  WBC 17.9* 17.5* 17.3* 13.7* 14.4* 12.0* 13.9*  NEUTROABS 14.1* 13.5* 13.7*  --   --   --   --   HGB 7.1* 7.4* 7.0* 7.5* 7.7* 8.1* 9.2*  HCT 21.7* 23.7* 21.4* 24.1* 24.0* 25.2* 28.6*  MCV 85.8 89.1 87.7 90.3 88.9 90.0 89.1  PLT 475* 489* 473* 528* 617* 634* 809*   Basic Metabolic Panel: Recent Labs  Lab 10/04/22 0231 10/05/22 0816 10/06/22 0641 10/07/22 0551 10/08/22 0744 10/09/22 0637 10/10/22 0456  NA 134*   < > 136 138 143 151* 152*  K 3.9   < > 5.0 4.8 4.3 3.7 3.3*  CL 99   < > 103 109 113* 117* 116*  CO2 19*   < > 19* 20* 15* 20*  20*  GLUCOSE 120*   < > 118* 89 132* 150* 191*  BUN 49*   < > 62* 60* 43* 33* 29*  CREATININE 2.33*   < > 3.06* 2.62* 2.03* 1.56* 1.37*  CALCIUM 8.9   < > 9.1 9.0 9.2 9.7 9.5  MG 2.0  --   --   --   --   --  1.7  PHOS  --   --   --  5.1*  --   --  2.5   < > = values in this interval not displayed.   GFR: Estimated Creatinine Clearance: 73.6 mL/min (A) (by C-G formula based on SCr of 1.37 mg/dL (H)).  Scheduled Meds:  amitriptyline  50 mg Per Tube Once   amitriptyline  75 mg Oral QHS   arformoterol  15 mcg Nebulization BID   aspirin EC  81 mg Oral Q breakfast   atorvastatin  80 mg Oral QHS   budesonide (PULMICORT) nebulizer solution  0.25 mg Nebulization BID   Chlorhexidine Gluconate Cloth  6 each Topical Q0600   cloNIDine  0.2 mg Transdermal Weekly   feeding supplement (PROSource TF20)  60  mL Per Tube Daily   free water  200 mL Per Tube Q4H   free water  250 mL Per Tube Once   heparin  5,000 Units Subcutaneous Q8H   insulin aspart  0-15 Units Subcutaneous TID WC   ipratropium-albuterol  3 mL Nebulization Q6H   levothyroxine  75 mcg Oral QAC breakfast   metoprolol tartrate  75 mg Per Tube BID   mupirocin ointment  1 Application Nasal BID   oxyCODONE  5 mg Oral Q4H   tamsulosin  0.8 mg Oral Daily   venlafaxine  75 mg Per Tube Once   venlafaxine XR  225 mg Oral Daily   Continuous Infusions:  cefTRIAXone (ROCEPHIN)  IV 2 g (10/10/22 0758)   dextrose 125 mL/hr at 10/10/22 0800   feeding supplement (JEVITY 1.5 CAL/FIBER) 1,000 mL (10/10/22 1112)   metronidazole     potassium chloride 10 mEq (10/10/22 1051)   potassium PHOSPHATE IVPB (in mmol)     thiamine (VITAMIN B1) injection 500 mg (10/10/22 1046)   Imaging and lab data was personally reviewed DG Abd Portable 1V  Result Date: 10/10/2022 CLINICAL DATA:  [id number] Encounter for feeding tube placement [id number] EXAM: PORTABLE ABDOMEN - 1 VIEW COMPARISON:  None Available. FINDINGS: Feeding tube tip overlies the stomach. Unremarkable  upper abdominal bowel gas pattern. IMPRESSION: Feeding tube tip overlies the stomach. Electronically Signed   By: Maurine Simmering M.D.   On: 10/10/2022 09:46   DG Chest Port 1 View  Result Date: 10/09/2022 CLINICAL DATA:  [id number] Fever [id number] EXAM: PORTABLE CHEST 1 VIEW COMPARISON:  Chest x-ray 09/25/2022, chest x-ray 04/22/2022 FINDINGS: The heart and mediastinal contours are unchanged. Developing hazy airspace opacity along the left upper lobe. No pulmonary edema. No pleural effusion. No pneumothorax. No acute osseous abnormality. Similar-appearing fractured sternotomy wires. IMPRESSION: 1. Developing hazy airspace opacity along the left upper lobe. 2. Similar-appearing fractured sternotomy wires. Electronically Signed   By: Iven Finn M.D.   On: 10/09/2022 14:52    LOS: 8 days   Author: Phillips Climes MD 10/10/2022 11:53 AM  To contact [facility]>   Check the care team in [facility] and look for the attending/consulting [place] provider listed  Log into [email or web address] and use [facility] universal password   Go to> "[facility]"  and find provider  If you still have difficulty reaching the provider, please page the [facility] (Director on Call) for the Hospitalists listed on amion

## 2022-10-10 NOTE — Consult Note (Signed)
[facility] New Face-to-Face Psychiatric Evaluation  Service Date: October 10, 2022 LOS:  LOS: 8 days   Assessment  Shane Chambers is a 61 y.o. male admitted medically for 10/02/2022  4:53 PM for cellulitis - septic arthritis/osteomyelitis. He carries the psychiatric diagnoses of depression and has a past medical history of rectal cancer, CAD s/p CABG 2021, COPD, T2DM, HLD, ischemic cardiomyopathy, OSA, hypothyroidism, HTN, PVD. Psychiatry was consulted for AMS, not taking amitriptyline for 3 days by Dr. Waldron Labs.   His current presentation of altered mental status is most consistent with hypoactive delirium vs. untreated OSA vs. residual anesthesia from recent surgery. Suspect also some underlying neurocognitive disorder given diffuse parenchymal loss on CT head. Less suspicious for amitriptyline withdrawal given no observed anxiety, agitation, confusion, insomnia, irritability noted on exam that would be consistent with withdrawal. Also considered catatonia given his minimal response to stimuli and mutism but no posturing, grimacing, stereotyping, waxy flexibility noted. Can continue to monitor. Recommend discontinuing amitriptyline due to concern for sedation, recommend removing sedating meds and keeping haldol for agitation, and consider pulmonology consult to evaluate for starting CPAP inpatient if able (patient had prior order placed after sleep study but looks like he never picked it up or followed up). Would also decrease effexor to '150mg'$  given his elevated blood pressures. Current outpatient psychotropic medications include elavil '75mg'$ , effexor '225mg'$  daily, gabapentin '600mg'$  BID. Unable to assess historical compliance or response given his altered mental status and inability to obtain collateral (number not in service). On initial examination, patient will open eyes and will intermittently track but does not respond to any questions or commands. Please see plan below for detailed  recommendations.   Diagnoses:  Active Hospital problems: Principal Problem:   Cellulitis Active Problems:   Hypertension   Type 2 diabetes mellitus (HCC)   Hyperlipidemia   OSA (obstructive sleep apnea)   GERD (gastroesophageal reflux disease)   AKI (acute kidney injury) ([place])   Subacute osteomyelitis, right ankle and foot (HCC)   Hypothyroidism   Hypoalbuminemia   Septic arthritis of right ankle ([place])    Plan  ## Safety and Observation Level:  - Based on my clinical evaluation, I estimate the patient to be at low risk of self harm in the current setting - At this time, we recommend a routine level of observation. This decision is based on my review of the chart including patient's history and current presentation, interview of the patient, mental status examination, and consideration of suicide risk including evaluating suicidal ideation, plan, intent, suicidal or self-harm behaviors, risk factors, and protective factors. This judgment is based on our ability to directly address suicide risk, implement suicide prevention strategies and develop a safety plan while the patient is in the clinical setting. Please contact our team if there is a concern that risk level has changed.  ## Medications:  -- STOP amitriptyline given concern for sedation contributing to AMS  -- DECREASE effexor from 225 to '150mg'$  given his elevated BP  -- Consider pulmonology referral to treat OSA while inpatient   ## Medical Decision Making Capacity:  Not assessed   ## Further Work-up:  -- most recent EKG on 11/26 had QtC of 506 -- Pertinent labwork reviewed earlier this admission includes: CO2 20 -- 11/29 CT head with no acute intracranial abnormality, diffuse parenchymal volume loss that is relatively advanced in relation to patient's age.   ## Disposition:  -- per primary   ## Behavioral / Environmental:  -- delirium precautions   ##  Legal Status -- Voluntary   Thank you for this consult request.  Recommendations have been communicated to the primary team.  We will follow at this time.   Rolanda Lundborg, MD, PGY-1   NEW history  Relevant Aspects of Hospital Course:  Admitted on 10/02/2022 for cellulitis.  11/27: Has had episodes of agitation, unable to answer commands or follow questions. He had not been taking effexor or amitriptyline for the past 3 days given encephalopathy, inability to place coretrack.   Patient Report:  Patient with eyes opened, intermittently tracking. Does not answer questions or speak spontaneously. Per nurse, patient has been occasionally difficult to arouse in the morning. Otherwise he has been alert but non-verbal.   ROS:  Unable to obtain  Collateral information:  As above   Attempted to call mom for collateral however phone number was out of service.   Psychiatric History:  Information collected from chart review Seen by [facility], unspecified depressive disorder and adjustment disorder are prior diagnoses No prior psychiatric admissions Home psychiatric meds include amitriptyline 75 QHS, effexor 225 daily.   Family psych history: mother history of mental disorder  Social History:  Per chart review, reports that he quit smoking about 27 years ago. His smoking use included cigarettes. He smoked an average of 1.5 packs per day. He has never used smokeless tobacco. He reports that he does not currently use alcohol. He reports that he does not use drugs.   Family History:  Unknown   The patient's family history includes Hypertension in his mother.  Medical History: Past Medical History:  Diagnosis Date   Anemia    Arthritis    CAD (coronary artery disease)    a. s/p CABG in 03/2020 with LIMA-LAD, RIMA-PL, RA-[id number]-OM1   Cancer ([place])    rectal   Cellulitis    COPD (chronic obstructive pulmonary disease) ([place])    Depression    Diabetes mellitus without complication (HCC)    GERD (gastroesophageal reflux disease)    History of kidney  stones    Hyperlipidemia 12/01/2019   Hypertension    Hypothyroidism    Ischemic cardiomyopathy    a. EF 20-25% by echo in 02/2020 b. at 40% by echo in 03/2020 c. EF normalized to 60-65% by echo in 04/2020   Myocardial infarction [facility])    Peripheral vascular disease ([place])    Sleep apnea    Type 2 diabetes mellitus ([place])     Surgical History: Past Surgical History:  Procedure Laterality Date   AMPUTATION Right 10/05/2022   Procedure: AMPUTATION BELOW KNEE;  Surgeon: Newt Minion, MD;  Location: [place];  Service: Orthopedics;  Laterality: Right;   APPLICATION OF WOUND VAC Right 10/05/2022   Procedure: APPLICATION OF WOUND VAC;  Surgeon: Newt Minion, MD;  Location: [place];  Service: Orthopedics;  Laterality: Right;   BIOPSY  07/18/2021   Procedure: BIOPSY;  Surgeon: Eloise Harman, DO;  [facility];  Service: Endoscopy;;   BIOPSY  01/09/2022   Procedure: BIOPSY;  Surgeon: Eloise Harman, DO;  [facility];  Service: Endoscopy;;   COLONOSCOPY WITH PROPOFOL N/A 07/18/2021   [place]: 15 millimeter polyp removed from the sigmoid colon, 5 mm polyp removed from sigmoid colon.  Nonbleeding internal hemorrhoids.  Significant looping of the colon. sigmoid path showed invasive colonic adenocarcinoma involving tubular adenoma (invades to depth of 26m, carcinoma 14mfrom margin, no lymphovascular invasion, no poorly differentiated component.   CORONARY ARTERY BYPASS GRAFT N/A 03/13/2020  Procedure: CORONARY ARTERY BYPASS GRAFTING (CABG) times five using bilateral Internal mammary arteries and left radial artery;  Surgeon: [name], MD;  Location: [place];  Service: Open Heart Surgery;  Laterality: N/A;   DENTAL SURGERY     ESOPHAGOGASTRODUODENOSCOPY (EGD) WITH PROPOFOL N/A 07/18/2021   [place]ver: 1 gastric polyp status post biopsy, gastritis. gastric bx with slight chronic inflammation and no H.pyori. GEJ polypectomy with mild inflammation only   FLEXIBLE  SIGMOIDOSCOPY N/A 08/26/2021   [place]: Nonbleeding internal hemorrhoids.  15 mm ulcers from previous polypectomy found in the rectum.  No evidence of previous polyp.  Located 5 to 8 cm from anal verge.   FLEXIBLE SIGMOIDOSCOPY N/A 01/09/2022   Procedure: FLEXIBLE SIGMOIDOSCOPY;  Surgeon: Eloise Harman, DO;  [facility];  Service: Endoscopy;  Laterality: N/A;   POLYPECTOMY  07/18/2021   Procedure: POLYPECTOMY INTESTINAL;  Surgeon: Eloise Harman, DO;  [facility];  Service: Endoscopy;;   RADIAL ARTERY HARVEST Left 03/13/2020   Procedure: [place],;  Surgeon: [name], MD;  Location: [place];  Service: Open Heart Surgery;  Laterality: Left;   RIGHT/LEFT HEART CATH AND CORONARY ANGIOGRAPHY N/A 03/07/2020   Procedure: RIGHT/LEFT HEART CATH AND CORONARY ANGIOGRAPHY;  Surgeon: Sherren Mocha, MD;  Location: [place] [facility];  Service: Cardiovascular;  Laterality: N/A;   SUBMUCOSAL LIFTING INJECTION  01/09/2022   Procedure: SUBMUCOSAL LIFTING INJECTION;  Surgeon: Eloise Harman, DO;  [facility];  Service: Endoscopy;;   SUBMUCOSAL TATTOO INJECTION  01/09/2022   Procedure: SUBMUCOSAL TATTOO INJECTION;  Surgeon: Eloise Harman, DO;  [facility];  Service: Endoscopy;;   TEE WITHOUT CARDIOVERSION N/A 03/13/2020   Procedure: TRANSESOPHAGEAL ECHOCARDIOGRAM (TEE);  Surgeon: [name], MD;  Location: [place];  Service: Open Heart Surgery;  Laterality: N/A;    Medications:   Current Facility-Administered Medications:    amLODipine (NORVASC) tablet 10 mg, 10 mg, Per Tube, Daily, Elgergawy, Silver Huguenin, MD, 10 mg at 10/10/22 1650   arformoterol (BROVANA) nebulizer solution 15 mcg, 15 mcg, Nebulization, BID, Elgergawy, Silver Huguenin, MD, 15 mcg at 10/10/22 1131   aspirin EC tablet 81 mg, 81 mg, Oral, Q breakfast, Zierle-Ghosh, Asia B, DO, 81 mg at 10/10/22 1055   atorvastatin (LIPITOR) tablet 80 mg, 80 mg, Oral, QHS, Zierle-Ghosh, Asia  B, DO, 80 mg at 10/05/22 2120   budesonide (PULMICORT) nebulizer solution 0.25 mg, 0.25 mg, Nebulization, BID, Elgergawy, Silver Huguenin, MD, 0.25 mg at 10/10/22 1131   cefTRIAXone (ROCEPHIN) 2 g in sodium chloride 0.9 % 100 mL IVPB, 2 g, Intravenous, Q24H, Elgergawy, Silver Huguenin, MD, Last Rate: 200 mL/hr at 10/10/22 0758, 2 g at 10/10/22 0758   Chlorhexidine Gluconate Cloth 2 % PADS 6 each, 6 each, Topical, Q0600, Debbe Odea, MD, 6 each at 10/10/22 0509   cloNIDine (CATAPRES - Dosed in mg/24 hr) patch 0.2 mg, 0.2 mg, Transdermal, Weekly, Elgergawy, Silver Huguenin, MD, 0.2 mg at 10/09/22 1525   dextrose 5 % solution, , Intravenous, Continuous, Elgergawy, Silver Huguenin, MD, Last Rate: 125 mL/hr at 10/10/22 0800, New Bag at 10/10/22 0800   feeding supplement (JEVITY 1.5 CAL/FIBER) liquid 1,000 mL, 1,000 mL, Per Tube, Continuous, Elgergawy, Silver Huguenin, MD, Last Rate: 60 mL/hr at 10/10/22 1112, 1,000 mL at 10/10/22 1112   feeding supplement (PROSource TF20) liquid 60 mL, 60 mL, Per Tube, Daily, Elgergawy, Silver Huguenin, MD, 60 mL at 10/10/22 1106   free water 300 mL, 300 mL,  Per Tube, Q4H, Elgergawy, Silver Huguenin, MD   haloperidol lactate (HALDOL) injection 1 mg, 1 mg, Intravenous, Q12H PRN, Elgergawy, Silver Huguenin, MD   heparin injection 5,000 Units, 5,000 Units, Subcutaneous, Q8H, Zierle-Ghosh, Asia B, DO, 5,000 Units at 10/10/22 1518   HYDROmorphone (DILAUDID) injection 0.5 mg, 0.5 mg, Intravenous, Q4H PRN, Elgergawy, Silver Huguenin, MD   insulin aspart (novoLOG) injection 0-15 Units, 0-15 Units, Subcutaneous, TID WC, Rizwan, Saima, MD, 3 Units at 10/10/22 1650   ipratropium-albuterol (DUONEB) 0.5-2.5 (3) MG/3ML nebulizer solution 3 mL, 3 mL, Nebulization, Q6H, Elgergawy, Silver Huguenin, MD, 3 mL at 10/10/22 1457   labetalol (NORMODYNE) injection 10 mg, 10 mg, Intravenous, Q4H PRN, Howerter, Justin B, DO, 10 mg at 10/10/22 0510   levothyroxine (SYNTHROID) tablet 75 mcg, 75 mcg, Oral, QAC breakfast, Zierle-Ghosh, Asia B, DO, 75 mcg at 10/04/22  0522   metoprolol tartrate (LOPRESSOR) tablet 75 mg, 75 mg, Per Tube, BID, Elgergawy, Silver Huguenin, MD, 75 mg at 10/10/22 1150   metroNIDAZOLE (FLAGYL) IVPB 500 mg, 500 mg, Intravenous, Q12H, Elgergawy, Silver Huguenin, MD, Last Rate: 100 mL/hr at 10/10/22 1202, 500 mg at 10/10/22 1202   mupirocin ointment (BACTROBAN) 2 % 1 Application, 1 Application, Nasal, BID, Rizwan, Eunice Blase, MD, 1 Application at 28/31/51 0815   ondansetron (ZOFRAN) tablet 4 mg, 4 mg, Oral, Q6H PRN **OR** ondansetron (ZOFRAN) injection 4 mg, 4 mg, Intravenous, Q6H PRN, Zierle-Ghosh, Asia B, DO, 4 mg at 10/03/22 1202   oxyCODONE (Oxy IR/ROXICODONE) immediate release tablet 5 mg, 5 mg, Oral, Q4H, Rizwan, Saima, MD, 5 mg at 10/10/22 1519   polyethylene glycol (MIRALAX / GLYCOLAX) packet 17 g, 17 g, Oral, Daily PRN, Zierle-Ghosh, Asia B, DO   tamsulosin (FLOMAX) capsule 0.8 mg, 0.8 mg, Oral, Daily, Zierle-Ghosh, Asia B, DO, 0.8 mg at 10/10/22 1107   thiamine (VITAMIN B1) 500 mg in normal saline (50 mL) IVPB, 500 mg, Intravenous, Daily, Elgergawy, Silver Huguenin, MD, Last Rate: 100 mL/hr at 10/10/22 1046, 500 mg at 10/10/22 1046   [START ON 10/11/2022] venlafaxine XR (EFFEXOR-XR) 24 hr capsule 150 mg, 150 mg, Oral, Daily, Leverne Humbles Guadalupe Maple, MD  Allergies: Allergies  Allergen Reactions   Ace Inhibitors Swelling and Cough    (Not on MAR at [facility])   Other Itching    [name]   Shellfish Allergy    Penicillins Itching and Rash    Has patient had a PCN reaction causing immediate rash, facial/tongue/throat swelling, SOB or lightheadedness with hypotension: Unknown Has patient had a PCN reaction causing severe rash involving mucus membranes or skin necrosis: Unknown Has patient had a PCN reaction that required hospitalization: Unknown Has patient had a PCN reaction occurring within the last 10 years: Unknown If all of the above answers are "NO", then may proceed with Cephalosporin use.      Objective  Vital  signs:  Temp:  [98 F (36.7 C)-99.3 F (37.4 C)] 98.8 F (37.1 C) (12/01 1600) Pulse Rate:  [77-118] 84 (12/01 1657) Resp:  [18-29] 20 (12/01 1657) BP: (163-198)/(73-113) 172/81 (12/01 1657) SpO2:  [91 %-100 %] 97 % (12/01 1657)  Psychiatric Specialty Exam: Presentation  General Appearance: Appears as stated age, sitting in bed with eyes open, getting nebulizer  Eye Contact: Good, intermittently tracking  Speech:None  Speech Volume:Unable to assess   Mood and Affect  Mood:Unable to assess  Affect: Flat   Thought Process  Thought Processes:Unable to assess Descriptions of Associations:Unable to assess Orientation:Unable to assess  Thought  Content: Unable to assess  History of Schizophrenia/Schizoaffective disorder:Unable to assess, none noted in chart review Duration of Psychotic Symptoms: Unable to assess  Hallucinations: Unable to assess  Ideas of Reference: Unable to assess  Suicidal Thoughts: Unable to assess  Homicidal Thoughts: Unable to assess   Sensorium  Memory: Unable to assess  Judgment: Unable to assess  Insight: Unable to assess   Executive Functions  Concentration: Unable to assess  Attention Span: Unable to assess  Recall: Unable to assess  Fund of Knowledge: Unable to assess  Language: Unable to assess   Psychomotor Activity  Psychomotor Activity: No psychomotor agitation noted.   Assets  Assets: Unable to assess   Sleep  Sleep: Not charted   Physical Exam Constitutional:      Appearance: the patient is not toxic-appearing.  Pulmonary:     Effort: Pulmonary effort is normal.  Neurological:     General: No focal deficit present.     Mental Status: the patient is alert.   Review of Systems  Unable to assess secondary to altered mental status   Blood pressure (!) 172/81, pulse 84, temperature 98.8 F (37.1 C), temperature source Oral, resp. rate 20, height 5' 9.02" (1.753 m), weight 118 kg, SpO2 97 %. Body mass index is 38.4 kg/m.

## 2022-10-10 NOTE — Procedures (Signed)
Cortrak  Person Inserting Tube:  Rhea Thrun L, RD Tube Type:  Cortrak - 43 inches Tube Size:  10 Tube Location:  Left nare Secured by: Bridle Technique Used to Measure Tube Placement:  Marking at nare/corner of mouth Cortrak Secured At:  65 cm   Cortrak Tube Team Note:  Consult received to place a Cortrak feeding tube.   X-ray is required, abdominal x-ray has been ordered by the Cortrak team. Please confirm tube placement before using the Cortrak tube.   If the tube becomes dislodged please keep the tube and contact the Cortrak team at [email or web address] for replacement.  If after hours and replacement cannot be delayed, place a NG tube and confirm placement with an abdominal x-ray.    Tashay Bozich RD, LDN Clinical Dietitian See AMiON for contact information.    

## 2022-10-10 NOTE — TOC Progression Note (Signed)
Transition of Care [facility]) - Progression Note    Patient Details  Name: [name] MRN: [id number] Date of Birth: 1961-11-03  Transition of Care [facility]) CM/SW [place], RN Phone Number: 10/10/2022, 2:19 PM  Clinical Narrative:     Patient somnolent, unable to answer questions called mother for information regarding LTAC, number [place] , the number is no longer in service. Patient is a resident in [place], called  Home number listed.for patient, [phone]  No answer called cell phone listed, [phone] left confidential message.   Expected Discharge Plan: Long Term Nursing Home Barriers to Discharge: Continued Medical Work up  Expected Discharge Plan and Services Expected Discharge Plan: [place] In-house Referral: Clinical Social Work     Living arrangements for the past 2 months: [place]                                       Social Determinants of Health (SDOH) Interventions    Readmission Risk Interventions    10/03/2022   10:08 AM 09/26/2022    1:16 PM 04/23/2022   10:20 AM  Readmission Risk Prevention Plan  Transportation Screening Complete Complete Complete  PCP or Specialist Appt within 3-5 Days  Complete Complete  HRI or [place]  Complete Complete  Social Work Consult for [place] Planning/Counseling  Complete Complete  Palliative Care Screening  Not Applicable Not Applicable  Medication Review [employer])  Complete Complete  HRI or Home Care Consult Complete    SW Recovery Care/Counseling Consult Complete    Palliative Care Screening Not Applicable    Skilled Nursing Facility Complete

## 2022-10-10 NOTE — Progress Notes (Addendum)
TRH night cross cover note:   I was contacted by RN requesting that the route of administration for patient's atorvastatin and prn oxycodone be changed to per tube, as the patient is not currently taking any medications by mouth.  I subsequently modified the route of administration for both of these medications, as requested.  I also changed route of administration for existing Synthroid to per tube, next dose to occur tomorrow AM.     Babs Bertin, DO Hospitalist

## 2022-10-10 NOTE — Consult Note (Signed)
[place] Nurse Consult Note: Patient receiving care in [facility] [phone]. In completing this consult I spoke with primary nurse Devin by phone. Reason for Consult: "assistance with patient's wound vac, as the associated machicine is consistently conveying the presence of a blockage; independent trouble-shooting and existing protocols unsuccessful in resolving; thank you very much. " Wound type: Surgical wound by Dr. Sharol Given on 10/05/22. When I spoke with Devin by phone to let him know they needed to reach out to Dr. Sharol Given and his team for assistance with the [facility] issue, he told me the nurse last night took the [facility] off.  I explained that the patient's nurse still needed to let Dr. Sharol Given know what has happened.  [place] nurse will not follow at this time.  Please re-consult the [place] team if needed.  Val Riles, RN, MSN, CWOCN, CNS-BC, pager [phone]

## 2022-10-11 DIAGNOSIS — N179 Acute kidney failure, unspecified: Secondary | ICD-10-CM | POA: Diagnosis not present

## 2022-10-11 DIAGNOSIS — M86271 Subacute osteomyelitis, right ankle and foot: Secondary | ICD-10-CM | POA: Diagnosis not present

## 2022-10-11 DIAGNOSIS — I10 Essential (primary) hypertension: Secondary | ICD-10-CM | POA: Diagnosis not present

## 2022-10-11 DIAGNOSIS — F331 Major depressive disorder, recurrent, moderate: Secondary | ICD-10-CM

## 2022-10-11 LAB — BASIC METABOLIC PANEL
Anion gap: 8 (ref 5–15)
Anion gap: 8 (ref 5–15)
BUN: 31 mg/dL — ABNORMAL HIGH (ref 6–20)
BUN: 28 mg/dL — ABNORMAL HIGH (ref 6–20)
CO2: 21 mmol/L — ABNORMAL LOW (ref 22–32)
CO2: 19 mmol/L — ABNORMAL LOW (ref 22–32)
Calcium: 9.2 mg/dL (ref 8.9–10.3)
Calcium: 8.5 mg/dL — ABNORMAL LOW (ref 8.9–10.3)
Chloride: 122 mmol/L — ABNORMAL HIGH (ref 98–111)
Chloride: 122 mmol/L — ABNORMAL HIGH (ref 98–111)
Creatinine, Ser: 1.48 mg/dL — ABNORMAL HIGH (ref 0.61–1.24)
Creatinine, Ser: 1.27 mg/dL — ABNORMAL HIGH (ref 0.61–1.24)
GFR, Estimated: 54 mL/min — ABNORMAL LOW (ref >60)
GFR, Estimated: >60 mL/min (ref >60)
Glucose, Bld: 199 mg/dL — ABNORMAL HIGH (ref 70–99)
Glucose, Bld: 171 mg/dL — ABNORMAL HIGH (ref 70–99)
Potassium: 3.5 mmol/L (ref 3.5–5.1)
Potassium: 3.7 mmol/L (ref 3.5–5.1)
Sodium: 151 mmol/L — ABNORMAL HIGH (ref 135–145)
Sodium: 149 mmol/L — ABNORMAL HIGH (ref 135–145)

## 2022-10-11 LAB — GLUCOSE, CAPILLARY
Glucose-Capillary: 183 mg/dL — ABNORMAL HIGH (ref 70–99)
Glucose-Capillary: 172 mg/dL — ABNORMAL HIGH (ref 70–99)
Glucose-Capillary: 135 mg/dL — ABNORMAL HIGH (ref 70–99)
Glucose-Capillary: 159 mg/dL — ABNORMAL HIGH (ref 70–99)
Glucose-Capillary: 160 mg/dL — ABNORMAL HIGH (ref 70–99)

## 2022-10-11 LAB — CBC
HCT: 28.5 % — ABNORMAL LOW (ref 39.0–52.0)
Hemoglobin: 8.9 g/dL — ABNORMAL LOW (ref 13.0–17.0)
MCH: 28.4 pg (ref 26.0–34.0)
MCHC: 31.2 g/dL (ref 30.0–36.0)
MCV: 91.1 fL (ref 80.0–100.0)
Platelets: 655 10*3/uL — ABNORMAL HIGH (ref 150–400)
RBC: 3.13 MIL/uL — ABNORMAL LOW (ref 4.22–5.81)
RDW: 15.7 % — ABNORMAL HIGH (ref 11.5–15.5)
WBC: 16.4 10*3/uL — ABNORMAL HIGH (ref 4.0–10.5)
nRBC: 0 % (ref 0.0–0.2)

## 2022-10-11 LAB — MAGNESIUM: Magnesium: 1.8 mg/dL (ref 1.7–2.4)

## 2022-10-11 LAB — PROCALCITONIN: Procalcitonin: <0.1 ng/mL

## 2022-10-11 LAB — PHOSPHORUS: Phosphorus: 2.6 mg/dL (ref 2.5–4.6)

## 2022-10-11 MED ORDER — K PHOS MONO-SOD PHOS DI & MONO 155-852-130 MG PO TABS
250.0000 mg | ORAL_TABLET | Freq: Two times a day (BID) | ORAL | Status: AC
  Administered 2022-10-11 – 2022-10-12 (×4): 250 mg
  Filled 2022-10-11 (×4): qty 1

## 2022-10-11 MED ORDER — IPRATROPIUM-ALBUTEROL 0.5-2.5 (3) MG/3ML IN SOLN
3.0000 mL | Freq: Two times a day (BID) | RESPIRATORY_TRACT | Status: DC
  Administered 2022-10-11 – 2022-10-16 (×10): 3 mL via RESPIRATORY_TRACT
  Filled 2022-10-11 (×11): qty 3

## 2022-10-11 MED ORDER — POTASSIUM CHLORIDE 20 MEQ PO PACK
20.0000 meq | PACK | Freq: Once | ORAL | Status: AC
  Administered 2022-10-11: 20 meq
  Filled 2022-10-11: qty 1

## 2022-10-11 MED ORDER — FREE WATER
300.0000 mL | Status: DC
  Administered 2022-10-11 – 2022-10-13 (×14): 300 mL

## 2022-10-11 NOTE — Consult Note (Signed)
[facility] New Face-to-Face Psychiatric Evaluation  Service Date: October 11, 2022 LOS:  LOS: 9 days   Assessment  Shane Chambers is a 61 y.o. male admitted medically for 10/02/2022  4:53 PM for cellulitis - septic arthritis/osteomyelitis. He carries the psychiatric diagnoses of depression and has a past medical history of rectal cancer, CAD s/p CABG 2021, COPD, T2DM, HLD, ischemic cardiomyopathy, OSA, hypothyroidism, HTN, PVD. Psychiatry was consulted for AMS, not taking amitriptyline for 3 days by Dr. Waldron Labs.   His current presentation of altered mental status is most consistent with hypoactive delirium vs. untreated OSA vs. residual anesthesia from recent surgery. Suspect also some underlying neurocognitive disorder given diffuse parenchymal loss on CT head. Less suspicious for amitriptyline withdrawal given no observed anxiety, agitation, confusion, insomnia, irritability noted on exam that would be consistent with withdrawal. Also considered catatonia given his minimal response to stimuli and mutism but no posturing, grimacing, stereotyping, waxy flexibility noted. Can continue to monitor. Recommend discontinuing amitriptyline due to concern for sedation, recommend removing sedating meds and keeping haldol for agitation, and consider pulmonology consult to evaluate for starting CPAP inpatient if able (patient had prior order placed after sleep study but looks like he never picked it up or followed up). Would also decrease effexor to '150mg'$  given his elevated blood pressures. Current outpatient psychotropic medications include elavil '75mg'$ , effexor '225mg'$  daily, gabapentin '600mg'$  BID. Unable to assess historical compliance or response given his altered mental status and inability to obtain collateral (number not in service). On initial examination, patient will open eyes and will intermittently track but does not respond to any questions or commands. Please see plan below for detailed  recommendations.   10/11/22: Patient seen face to face lying in his hospital bed, his mother at his bedside. Today, he is awake, alert, oriented x 3, calm, cooperative with slow but audible speech. He states that he is making a slow but steady progress as evidence by increase alertness, self motivation and decreased anxiety and depressive symptoms. However, he is still concern by his inability to speak fluently but appreciate his improvement on current medication dosage-Effexor XR 150 mg daily. Overall, patient denies psychosis, delusions, mood swings and self harming thoughts.   Diagnoses:  Active Hospital problems: Principal Problem:   Cellulitis Active Problems:   Hypertension   Type 2 diabetes mellitus (HCC)   Hyperlipidemia   OSA (obstructive sleep apnea)   GERD (gastroesophageal reflux disease)   AKI (acute kidney injury) ([place])   Subacute osteomyelitis, right ankle and foot (HCC)   Hypothyroidism   Hypoalbuminemia   Septic arthritis of right ankle ([place])    Plan  ## Safety and Observation Level:  - Based on my clinical evaluation, I estimate the patient to be at low risk of self harm in the current setting - At this time, we recommend a routine level of observation. This decision is based on my review of the chart including patient's history and current presentation, interview of the patient, mental status examination, and consideration of suicide risk including evaluating suicidal ideation, plan, intent, suicidal or self-harm behaviors, risk factors, and protective factors. This judgment is based on our ability to directly address suicide risk, implement suicide prevention strategies and develop a safety plan while the patient is in the clinical setting. Please contact our team if there is a concern that risk level has changed.  ## Medications:  -- STOP amitriptyline given concern for sedation contributing to AMS  -- Continue Effexor XR 150 mg  daily, reduced from 225 mg daily due  to elevated BP  -- Consider pulmonology referral to treat OSA while inpatient   ## Medical Decision Making Capacity:  Not assessed   ## Further Work-up:  -- most recent EKG on 11/26 had QtC of 506 -- Pertinent labwork reviewed earlier this admission includes: CO2 20 -- 11/29 CT head with no acute intracranial abnormality, diffuse parenchymal volume loss that is relatively advanced in relation to patient's age.   ## Disposition:  -- per primary   ## Behavioral / Environmental:  -- delirium precautions   ##Legal Status -- Voluntary   Thank you for this consult request. Recommendations have been communicated to the primary team.  We will follow at this time.   Corena Pilgrim, MD,   NEW history  Relevant Aspects of Hospital Course:  Admitted on 10/02/2022 for cellulitis.  11/27: Has had episodes of agitation, unable to answer commands or follow questions. He had not been taking effexor or amitriptyline for the past 3 days given encephalopathy, inability to place coretrack.   Patient Report:  Patient with eyes opened, intermittently tracking. Does not answer questions or speak spontaneously. Per nurse, patient has been occasionally difficult to arouse in the morning. Otherwise he has been alert but non-verbal.   ROS:  Unable to obtain  Collateral information:  As above   Attempted to call mom for collateral however phone number was out of service.   Psychiatric History:  Information collected from chart review Seen by [facility], unspecified depressive disorder and adjustment disorder are prior diagnoses No prior psychiatric admissions Home psychiatric meds include amitriptyline 75 QHS, effexor 225 daily.   Family psych history: mother history of mental disorder  Social History:  Per chart review, reports that he quit smoking about 27 years ago. His smoking use included cigarettes. He smoked an average of 1.5 packs per day. He has never used smokeless tobacco. He reports  that he does not currently use alcohol. He reports that he does not use drugs.   Family History:  Unknown   The patient's family history includes Hypertension in his mother.  Medical History: Past Medical History:  Diagnosis Date   Anemia    Arthritis    CAD (coronary artery disease)    a. s/p CABG in 03/2020 with LIMA-LAD, RIMA-PL, RA-[id number]-OM1   Cancer ([place])    rectal   Cellulitis    COPD (chronic obstructive pulmonary disease) ([place])    Depression    Diabetes mellitus without complication (HCC)    GERD (gastroesophageal reflux disease)    History of kidney stones    Hyperlipidemia 12/01/2019   Hypertension    Hypothyroidism    Ischemic cardiomyopathy    a. EF 20-25% by echo in 02/2020 b. at 40% by echo in 03/2020 c. EF normalized to 60-65% by echo in 04/2020   Myocardial infarction [facility])    Peripheral vascular disease ([place])    Sleep apnea    Type 2 diabetes mellitus ([place])     Surgical History: Past Surgical History:  Procedure Laterality Date   AMPUTATION Right 10/05/2022   Procedure: AMPUTATION BELOW KNEE;  Surgeon: Newt Minion, MD;  Location: [place];  Service: Orthopedics;  Laterality: Right;   APPLICATION OF WOUND VAC Right 10/05/2022   Procedure: APPLICATION OF WOUND VAC;  Surgeon: Newt Minion, MD;  Location: [place];  Service: Orthopedics;  Laterality: Right;   BIOPSY  07/18/2021   Procedure: BIOPSY;  Surgeon: Eloise Harman, DO;  [facility];  Service: Endoscopy;;   BIOPSY  01/09/2022   Procedure: BIOPSY;  Surgeon: Eloise Harman, DO;  [facility];  Service: Endoscopy;;   COLONOSCOPY WITH PROPOFOL N/A 07/18/2021   Carver: 15 millimeter polyp removed from the sigmoid colon, 5 mm polyp removed from sigmoid colon.  Nonbleeding internal hemorrhoids.  Significant looping of the colon. sigmoid path showed invasive colonic adenocarcinoma involving tubular adenoma (invades to depth of 32m, carcinoma 162mfrom margin, no lymphovascular  invasion, no poorly differentiated component.   CORONARY ARTERY BYPASS GRAFT N/A 03/13/2020   Procedure: CORONARY ARTERY BYPASS GRAFTING (CABG) times five using bilateral Internal mammary arteries and left radial artery;  Surgeon: AtWonda OldsMD;  [place] [facility] [place]: [place];  Laterality: N/A;   DENTAL SURGERY     ESOPHAGOGASTRODUODENOSCOPY (EGD) WITH PROPOFOL N/A 07/18/2021   Carver: 1 gastric polyp status post biopsy, gastritis. gastric bx with slight chronic inflammation and no H.pyori. GEJ polypectomy with mild inflammation only   FLEXIBLE SIGMOIDOSCOPY N/A 08/26/2021   Carver: Nonbleeding internal hemorrhoids.  15 mm ulcers from previous polypectomy found in the rectum.  No evidence of previous polyp.  Located 5 to 8 cm from anal verge.   FLEXIBLE SIGMOIDOSCOPY N/A 01/09/2022   Procedure: FLEXIBLE SIGMOIDOSCOPY;  Surgeon: CaEloise HarmanDO;  [facility];  Service: Endoscopy;  Laterality: N/A;   POLYPECTOMY  07/18/2021   Procedure: POLYPECTOMY INTESTINAL;  Surgeon: CaEloise HarmanDO;  [facility];  Service: Endoscopy;;   RADIAL ARTERY HARVEST Left 03/13/2020   Procedure: [place]  Surgeon: AtWonda OldsMD;  [place] [facility] [place]: [place] [facility];  Laterality: Left;   RIGHT/LEFT HEART CATH AND CORONARY ANGIOGRAPHY N/A 03/07/2020   Procedure: RIGHT/LEFT HEART CATH AND CORONARY ANGIOGRAPHY;  Surgeon: CoSherren MochaMD;  [facility];  Service: Cardiovascular;  Laterality: N/A;   SUBMUCOSAL LIFTING INJECTION  01/09/2022   Procedure: SUBMUCOSAL LIFTING INJECTION;  Surgeon: CaEloise HarmanDO;  [facility];  Service: Endoscopy;;   SUBMUCOSAL TATTOO INJECTION  01/09/2022   Procedure: SUBMUCOSAL TATTOO INJECTION;  Surgeon: CaEloise HarmanDO;  [facility];  Service: Endoscopy;;   TEE WITHOUT CARDIOVERSION N/A 03/13/2020   Procedure: TRANSESOPHAGEAL ECHOCARDIOGRAM (TEE);   Surgeon: AtWonda OldsMD;  [place] [facility] Service: Open [facility] Surgery;  Laterality: N/A;    Medications:   Current Facility-Administered Medications:    amLODipine (NORVASC) tablet 10 mg, 10 mg, Per Tube, Daily, Elgergawy, DaSilver HugueninMD, 10 mg at 10/11/22 0848   arformoterol (BROVANA) nebulizer solution 15 mcg, 15 mcg, Nebulization, BID, Elgergawy, DaSilver HugueninMD, 15 mcg at 10/11/22 078299 aspirin EC tablet 81 mg, 81 mg, Oral, Q breakfast, Zierle-Ghosh, Asia B, DO, 81 mg at 10/11/22 0846   atorvastatin (LIPITOR) tablet 80 mg, 80 mg, Per Tube, QHS, Howerter, Justin B, DO, 80 mg at 10/10/22 2107   budesonide (PULMICORT) nebulizer solution 0.25 mg, 0.25 mg, Nebulization, BID, Elgergawy, DaSilver HugueninMD, 0.25 mg at 10/11/22 073716 cefTRIAXone (ROCEPHIN) 2 g in sodium chloride 0.9 % 100 mL IVPB, 2 g, Intravenous, Q24H, Elgergawy, DaSilver HugueninMD, Last Rate: 200 mL/hr at 10/11/22 0847, 2 g at 10/11/22 0847   cloNIDine (CATAPRES - Dosed in mg/24 hr) patch 0.2 mg, 0.2 mg, Transdermal, Weekly, Elgergawy, DaSilver HugueninMD, 0.2 mg at 10/09/22 1525   dextrose 5 % solution, , Intravenous, Continuous, Elgergawy,  Silver Huguenin, MD, Last Rate: 150 mL/hr at 10/11/22 0844, Rate Change at 10/11/22 0844   feeding supplement (JEVITY 1.5 CAL/FIBER) liquid 1,000 mL, 1,000 mL, Per Tube, Continuous, Elgergawy, Silver Huguenin, MD, Last Rate: 60 mL/hr at 10/10/22 1112, 1,000 mL at 10/10/22 1112   feeding supplement (PROSource TF20) liquid 60 mL, 60 mL, Per Tube, Daily, Elgergawy, Silver Huguenin, MD, 60 mL at 10/11/22 0845   free water 300 mL, 300 mL, Per Tube, Q3H, Elgergawy, Silver Huguenin, MD, 300 mL at 10/11/22 0845   haloperidol lactate (HALDOL) injection 1 mg, 1 mg, Intravenous, Q12H PRN, Elgergawy, Silver Huguenin, MD   heparin injection 5,000 Units, 5,000 Units, Subcutaneous, Q8H, Zierle-Ghosh, Asia B, DO, 5,000 Units at 10/11/22 0450   HYDROcodone-acetaminophen (NORCO/VICODIN) 5-325 MG per tablet 1 tablet, 1 tablet, Per Tube, Q6H PRN, Elgergawy,  Silver Huguenin, MD   insulin aspart (novoLOG) injection 0-15 Units, 0-15 Units, Subcutaneous, Q4H, Elgergawy, Silver Huguenin, MD, 3 Units at 10/11/22 0852   ipratropium-albuterol (DUONEB) 0.5-2.5 (3) MG/3ML nebulizer solution 3 mL, 3 mL, Nebulization, BID, Elgergawy, Silver Huguenin, MD   labetalol (NORMODYNE) injection 10 mg, 10 mg, Intravenous, Q4H PRN, Howerter, Justin B, DO, 10 mg at 10/11/22 0458   levothyroxine (SYNTHROID) tablet 75 mcg, 75 mcg, Per Tube, QAC breakfast, Howerter, Justin B, DO, 75 mcg at 10/11/22 0450   metoprolol tartrate (LOPRESSOR) tablet 75 mg, 75 mg, Per Tube, BID, Elgergawy, Silver Huguenin, MD, 75 mg at 10/11/22 0845   metroNIDAZOLE (FLAGYL) IVPB 500 mg, 500 mg, Intravenous, Q12H, Elgergawy, Silver Huguenin, MD, Last Rate: 100 mL/hr at 10/11/22 0850, 500 mg at 10/11/22 0850   mupirocin ointment (BACTROBAN) 2 % 1 Application, 1 Application, Nasal, BID, Rizwan, Eunice Blase, MD, 1 Application at 77/82/42 0849   ondansetron (ZOFRAN) tablet 4 mg, 4 mg, Oral, Q6H PRN **OR** ondansetron (ZOFRAN) injection 4 mg, 4 mg, Intravenous, Q6H PRN, Zierle-Ghosh, Asia B, DO, 4 mg at 10/03/22 1202   oxyCODONE (Oxy IR/ROXICODONE) immediate release tablet 5 mg, 5 mg, Per Tube, Q4H, Howerter, Justin B, DO, 5 mg at 10/11/22 0849   phosphorus (K PHOS NEUTRAL) tablet 250 mg, 250 mg, Per Tube, BID, Elgergawy, Silver Huguenin, MD, 250 mg at 10/11/22 0849   polyethylene glycol (MIRALAX / GLYCOLAX) packet 17 g, 17 g, Oral, Daily PRN, Zierle-Ghosh, Asia B, DO   tamsulosin (FLOMAX) capsule 0.8 mg, 0.8 mg, Oral, Daily, Zierle-Ghosh, Asia B, DO, 0.8 mg at 10/11/22 0850   thiamine (VITAMIN B1) 500 mg in normal saline (50 mL) IVPB, 500 mg, Intravenous, Daily, Elgergawy, Silver Huguenin, MD, Last Rate: 100 mL/hr at 10/10/22 1046, 500 mg at 10/10/22 1046   venlafaxine XR (EFFEXOR-XR) 24 hr capsule 150 mg, 150 mg, Oral, Daily, Lavella Hammock, MD, 150 mg at 10/11/22 0850  Allergies: Allergies  Allergen Reactions   Ace Inhibitors Swelling and Cough    (Not  on MAR at [facility])   Other Itching    [name]   Shellfish Allergy    Penicillins Itching and Rash    Has patient had a PCN reaction causing immediate rash, facial/tongue/throat swelling, SOB or lightheadedness with hypotension: Unknown Has patient had a PCN reaction causing severe rash involving mucus membranes or skin necrosis: Unknown Has patient had a PCN reaction that required hospitalization: Unknown Has patient had a PCN reaction occurring within the last 10 years: Unknown If all of the above answers are "NO", then may proceed with Cephalosporin use.      Objective  Vital signs:  Temp:  [97.7 F (36.5 C)-99.2 F (37.3 C)] 97.7 F (36.5 C) (12/02 1149) Pulse Rate:  [66-96] 66 (12/02 1149) Resp:  [12-25] 22 (12/02 1149) BP: (143-175)/(68-87) 143/68 (12/02 1149) SpO2:  [94 %-100 %] 98 % (12/02 1149)  Psychiatric Specialty Exam: Presentation  General Appearance: Appears as stated age, sitting in bed with eyes open, getting nebulizer  Eye Contact: Good, intermittently tracking  Speech:None  Speech Volume:slow  Mood and Affect  Mood:dysphoric  Affect: constricted   Thought Process  Thought Processes:intact Descriptions of Associations:intact Orientation: to time, place and person Thought Content: intact History of Schizophrenia/Schizoaffective disorder:denies Duration of Psychotic Symptoms:   Hallucinations: denies Ideas of Reference:   Suicidal Thoughts: denies Homicidal Thoughts: denies  Sensorium  Memory: fair  Judgment: intact Insight: fair  Executive Functions  Concentration: fair Attention Span: fair Recall: fair Fund of Knowledge: fair Language: good  Psychomotor Activity  Psychomotor Activity: decreased.   Assets  Assets: family support  Sleep  Sleep: fair   Physical Exam Constitutional:      Appearance: the patient is not toxic-appearing.  Pulmonary:     Effort: Pulmonary effort is normal.   Neurological:     General: No focal deficit present.     Mental Status: the patient is alert.   Review of Systems  Unable to assess secondary to altered mental status   Blood pressure (!) 143/68, pulse 66, temperature 97.7 F (36.5 C), temperature source Oral, resp. rate (!) 22, height 5' 9.02" (1.753 m), weight 118 kg, SpO2 98 %. Body mass index is 38.4 kg/m.

## 2022-10-11 NOTE — Plan of Care (Signed)
  Problem: Clinical Measurements: Goal: Ability to avoid or minimize complications of infection will improve Outcome: Progressing   Problem: Skin Integrity: Goal: Skin integrity will improve Outcome: Progressing   Problem: Education: Goal: Knowledge of General Education information will improve Description: Including pain rating scale, medication(s)/side effects and non-pharmacologic comfort measures Outcome: Progressing   Problem: Health Behavior/Discharge Planning: Goal: Ability to manage health-related needs will improve Outcome: Progressing   Problem: Clinical Measurements: Goal: Ability to maintain clinical measurements within normal limits will improve Outcome: Progressing Goal: Will remain free from infection Outcome: Progressing Goal: Diagnostic test results will improve Outcome: Progressing Goal: Respiratory complications will improve Outcome: Progressing Goal: Cardiovascular complication will be avoided Outcome: Progressing   Problem: Activity: Goal: Risk for activity intolerance will decrease Outcome: Progressing   Problem: Nutrition: Goal: Adequate nutrition will be maintained Outcome: Progressing   Problem: Coping: Goal: Level of anxiety will decrease Outcome: Progressing   Problem: Elimination: Goal: Will not experience complications related to bowel motility Outcome: Progressing Goal: Will not experience complications related to urinary retention Outcome: Progressing   Problem: Pain Managment: Goal: General experience of comfort will improve Outcome: Progressing   Problem: Safety: Goal: Ability to remain free from injury will improve Outcome: Progressing   Problem: Skin Integrity: Goal: Risk for impaired skin integrity will decrease Outcome: Progressing   Problem: Education: Goal: Ability to describe self-care measures that may prevent or decrease complications (Diabetes Survival Skills Education) will improve Outcome: Progressing Goal:  Individualized Educational Video(s) Outcome: Progressing   Problem: Coping: Goal: Ability to adjust to condition or change in health will improve Outcome: Progressing   Problem: Fluid Volume: Goal: Ability to maintain a balanced intake and output will improve Outcome: Progressing   Problem: Health Behavior/Discharge Planning: Goal: Ability to identify and utilize available resources and services will improve Outcome: Progressing Goal: Ability to manage health-related needs will improve Outcome: Progressing   Problem: Metabolic: Goal: Ability to maintain appropriate glucose levels will improve Outcome: Progressing   Problem: Nutritional: Goal: Maintenance of adequate nutrition will improve Outcome: Progressing Goal: Progress toward achieving an optimal weight will improve Outcome: Progressing   Problem: Skin Integrity: Goal: Risk for impaired skin integrity will decrease Outcome: Progressing   Problem: Tissue Perfusion: Goal: Adequacy of tissue perfusion will improve Outcome: Progressing   Problem: Education: Goal: Knowledge of the prescribed therapeutic regimen will improve Outcome: Progressing Goal: Ability to verbalize activity precautions or restrictions will improve Outcome: Progressing Goal: Understanding of discharge needs will improve Outcome: Progressing   Problem: Activity: Goal: Ability to perform//tolerate increased activity and mobilize with assistive devices will improve Outcome: Progressing   Problem: Clinical Measurements: Goal: Postoperative complications will be avoided or minimized Outcome: Progressing   Problem: Self-Care: Goal: Ability to meet self-care needs will improve Outcome: Progressing   Problem: Self-Concept: Goal: Ability to maintain and perform role responsibilities to the fullest extent possible will improve Outcome: Progressing   Problem: Pain Management: Goal: Pain level will decrease with appropriate interventions Outcome:  Progressing   Problem: Skin Integrity: Goal: Demonstration of wound healing without infection will improve Outcome: Progressing

## 2022-10-11 NOTE — Progress Notes (Signed)
Patient ID: NOX TALENT, male   DOB: 13-Dec-1960, 61 y.o.   MRN: [id number] Patient is status post right transtibial amputation.  The dressing is dry the wound VAC is off.  Patient has no complaints of pain.

## 2022-10-11 NOTE — Progress Notes (Signed)
[facility] Progress Note  Patient: Shane Chambers     TMA:[id number]  DOA: 10/02/2022   PCP: Caprice Renshaw, MD       Brief hospital course: This is a 61 year old male with diabetes mellitus, coronary artery disease status post CABG, COPD, hypertension, coronary artery disease, peripheral vascular disease who presents to the hospital with right leg pain and swelling. The patient was hospitalized from 11/16 through 11/22 for cellulitis of the right leg, treated with ceftriaxone and vancomycin and discharged with doxycycline and cefadroxil. Blood cultures grew Bacillus species which was felt to be a contaminant.  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  Patient hospital course was complicated by significant encephalopathy and altered mental status.   Subjective:   Significant events overnight, patient is more awake and alert today, communicative, asking if he can eat.   Assessment and Plan: Principal Problem: Cellulitis-septic arthritis/osteomyelitis - WBC noted to be 18.6 - Imaging with CT scan and MRI suggestive of right ankle septic arthritis with osteomyelitis.  - 11/26- s/p AKA - has been receiving vancomycin and cefepime - recommended to continue x 3 days post op per Dr Sharol Given -Cefepime has been changed to Rocephin due to encephalopathy.  unable to change to Zosyn- he is allergic to PCN  Fever/PNA - patient with temperature 100.4 11/30, chest x-ray significant for right upper lung opacity, will start on Rocephin and azithromycin for up pneumonia.  -Patient with tachycardia, hypertension, fever, he has not taking any of his Effexor or amitriptyline for last 3 days, unclear of the symptoms related to withdrawal, will insert NG tube and give 1 dose of amitriptyline and Effexor.  Delirium Acute metabolic encephalopathy -Certainly with some underlying vascular dementia, as has been living at nursing home for last 2 years, with evidence  of significant trophy and CVA on CT head obtained yesterday. -Patient is screaming/yelling intermittently, unable to answer any commands or follow any questions . -Hospital delirium contributing, will keep on as needed Haldol. -Minimize narcotics and sedative medications -Patient mentation much improved this morning, will start on dysphagia 3 diet  Hypernatremia -Significantly elevated at 151 despite being on D5W and free water via tube, will increase both today.   AKI with metabolic acidosis - Baseline creatinine is about 0.9-1.4 - Creatinine has been steadily rising peaked at 3.06 -Continue to hold losartan and torsemide - have asked for nephrology consult - renal ultrasound unremarkable - Cr is now improving  Acute blood loss anemia-anemia of chronic disease -Hemoglobin 7.7 today - transfused 1 U PRBC on 11/25 and again on 11/27 - anemia panel was obtained on 11/20-ferritin was 913 - iron saturation was low as was iron binding - IV Ferric gluconate given on 11/27 - cont to follow Hgb- has bloody drainage in wound vac and may need further transfusions    Type 2 diabetes mellitus ([place]) -Home medications include 70/30 and Glucophage -NovoLog sliding scale ordered -Hemoglobin A1C    Component Value Date/Time   HGBA1C 5.6 09/26/2022 0404   Hypothyroidism - Continue levothyroxine  Chronic diastolic heart failure/hypertension - Last echo from 2021 revealed grade 2 diastolic dysfunction with an EF of 55 to 60% - Diuretics currently on hold due to AKI -Continue metoprolol  History of coronary artery disease status post CABG - Continue aspirin and Lipitor  Hypertension -Blood pressure remains significantly elevated, but on clonidine patch as oral intake unreliable, now he has feeding tube will start on scheduled metoprolol as well, continue with as needed  hydralazine and metoprolol.   BPH - Continue Flomax  Hypokalemia - repleted, recheck in am.     Code Status: Full  Code Consultants: Orthopedic surgery Level of Care: Level of care: Telemetry Medical Family communication: Discussed with mother at bedside 11/30, 12/2 DVT prophylaxis:  heparin injection 5,000 Units Start: 10/03/22 0600 SCDs Start: 10/03/22 0127     Objective:   Vitals:   10/11/22 0450 10/11/22 0517 10/11/22 0741 10/11/22 1149  BP: (!) 175/80 (!) 159/75 (!) 168/83 (!) 143/68  Pulse: 78 75 86 66  Resp: 20 18 (!) 23 (!) 22  Temp:   98 F (36.7 C) 97.7 F (36.5 C)  TempSrc:   Oral Oral  SpO2: 94% 97% 97% 98%  Weight:      Height:       Filed Weights   10/05/22 1026  Weight: 118 kg   Exam:  Awake alert oriented x 3, he is very appropriate and coherent today, frail Symmetrical Chest wall movement, Good air movement bilaterally, CTAB RRR,No Gallops,Rubs or new Murmurs, No Parasternal Heave +ve B.Sounds, Abd Soft, No tenderness, No rebound - guarding or rigidity. No Cyanosis, Clubbing or edema, right BKA   CBC: Recent Labs  Lab 10/05/22 0816 10/06/22 0641 10/07/22 0551 10/08/22 0744 10/09/22 0637 10/10/22 0456 10/11/22 0153  WBC 17.5* 17.3* 13.7* 14.4* 12.0* 13.9* 16.4*  NEUTROABS 13.5* 13.7*  --   --   --   --   --   HGB 7.4* 7.0* 7.5* 7.7* 8.1* 9.2* 8.9*  HCT 23.7* 21.4* 24.1* 24.0* 25.2* 28.6* 28.5*  MCV 89.1 87.7 90.3 88.9 90.0 89.1 91.1  PLT 489* 473* 528* 617* 634* 667* 858*   Basic Metabolic Panel: Recent Labs  Lab 10/07/22 0551 10/08/22 0744 10/09/22 0637 10/10/22 0456 10/10/22 1522 10/11/22 0153  NA 138 143 151* 152* 152* 151*  K 4.8 4.3 3.7 3.3* 4.0 3.5  CL 109 113* 117* 116* 120* 122*  CO2 20* 15* 20* 20* 20* 21*  GLUCOSE 89 132* 150* 191* 231* 199*  BUN 60* 43* 33* 29* 31* 31*  CREATININE 2.62* 2.03* 1.56* 1.37* 1.35* 1.48*  CALCIUM 9.0 9.2 9.7 9.5 10.3 9.2  MG  --   --   --  1.7  --  1.8  PHOS 5.1*  --   --  2.5  --  2.6   GFR: Estimated Creatinine Clearance: 68.1 mL/min (A) (by C-G formula based on SCr of 1.48 mg/dL (H)).  Scheduled  Meds:  amLODipine  10 mg Per Tube Daily   arformoterol  15 mcg Nebulization BID   aspirin EC  81 mg Oral Q breakfast   atorvastatin  80 mg Per Tube QHS   budesonide (PULMICORT) nebulizer solution  0.25 mg Nebulization BID   cloNIDine  0.2 mg Transdermal Weekly   feeding supplement (PROSource TF20)  60 mL Per Tube Daily   free water  300 mL Per Tube Q3H   heparin  5,000 Units Subcutaneous Q8H   insulin aspart  0-15 Units Subcutaneous Q4H   ipratropium-albuterol  3 mL Nebulization BID   levothyroxine  75 mcg Per Tube QAC breakfast   metoprolol tartrate  75 mg Per Tube BID   mupirocin ointment  1 Application Nasal BID   oxyCODONE  5 mg Per Tube Q4H   phosphorus  250 mg Per Tube BID   tamsulosin  0.8 mg Oral Daily   venlafaxine XR  150 mg Oral Daily   Continuous Infusions:  cefTRIAXone (ROCEPHIN)  IV 2  g (10/11/22 0847)   dextrose 150 mL/hr at 10/11/22 1411   feeding supplement (JEVITY 1.5 CAL/FIBER) 1,000 mL (10/10/22 1112)   metronidazole 500 mg (10/11/22 0850)   thiamine (VITAMIN B1) injection 500 mg (10/11/22 1052)   Imaging and lab data was personally reviewed DG Abd Portable 1V  Result Date: 10/10/2022 CLINICAL DATA:  [id number] Encounter for feeding tube placement [id number] EXAM: PORTABLE ABDOMEN - 1 VIEW COMPARISON:  None Available. FINDINGS: Feeding tube tip overlies the stomach. Unremarkable upper abdominal bowel gas pattern. IMPRESSION: Feeding tube tip overlies the stomach. Electronically Signed   By: Maurine Simmering M.D.   On: 10/10/2022 09:46   DG Chest Port 1 View  Result Date: 10/09/2022 CLINICAL DATA:  [id number] Fever [id number] EXAM: PORTABLE CHEST 1 VIEW COMPARISON:  Chest x-ray 09/25/2022, chest x-ray 04/22/2022 FINDINGS: The heart and mediastinal contours are unchanged. Developing hazy airspace opacity along the left upper lobe. No pulmonary edema. No pleural effusion. No pneumothorax. No acute osseous abnormality. Similar-appearing fractured sternotomy wires. IMPRESSION: 1.  Developing hazy airspace opacity along the left upper lobe. 2. Similar-appearing fractured sternotomy wires. Electronically Signed   By: Iven Finn M.D.   On: 10/09/2022 14:52    LOS: 9 days   Author: Phillips Climes MD 10/11/2022 2:27 PM  To contact [facility]>   Check the care team in [facility] and look for the attending/consulting [facility] provider listed  Log into [email or web address] and use [place]'s universal password   Go to> "[facility]"  and find provider  If you still have difficulty reaching the provider, please page the [facility] (Director on Call) for the Hospitalists listed on amion

## 2022-10-12 ENCOUNTER — Inpatient Hospital Stay (HOSPITAL_COMMUNITY): Payer: Medicare Other

## 2022-10-12 DIAGNOSIS — N179 Acute kidney failure, unspecified: Secondary | ICD-10-CM | POA: Diagnosis not present

## 2022-10-12 DIAGNOSIS — I4721 Torsades de pointes: Secondary | ICD-10-CM

## 2022-10-12 DIAGNOSIS — M009 Pyogenic arthritis, unspecified: Secondary | ICD-10-CM | POA: Diagnosis not present

## 2022-10-12 DIAGNOSIS — K219 Gastro-esophageal reflux disease without esophagitis: Secondary | ICD-10-CM | POA: Diagnosis not present

## 2022-10-12 LAB — CBC WITH DIFFERENTIAL/PLATELET
Abs Immature Granulocytes: 0.29 10*3/uL — ABNORMAL HIGH (ref 0.00–0.07)
Basophils Absolute: 0.1 10*3/uL (ref 0.0–0.1)
Basophils Relative: 1 %
Eosinophils Absolute: 0.8 10*3/uL — ABNORMAL HIGH (ref 0.0–0.5)
Eosinophils Relative: 6 %
HCT: 29.5 % — ABNORMAL LOW (ref 39.0–52.0)
Hemoglobin: 9.1 g/dL — ABNORMAL LOW (ref 13.0–17.0)
Immature Granulocytes: 2 %
Lymphocytes Relative: 15 %
Lymphs Abs: 2.1 10*3/uL (ref 0.7–4.0)
MCH: 28.6 pg (ref 26.0–34.0)
MCHC: 30.8 g/dL (ref 30.0–36.0)
MCV: 92.8 fL (ref 80.0–100.0)
Monocytes Absolute: 1 10*3/uL (ref 0.1–1.0)
Monocytes Relative: 7 %
Neutro Abs: 10 10*3/uL — ABNORMAL HIGH (ref 1.7–7.7)
Neutrophils Relative %: 69 %
Platelets: 489 10*3/uL — ABNORMAL HIGH (ref 150–400)
RBC: 3.18 MIL/uL — ABNORMAL LOW (ref 4.22–5.81)
RDW: 15.4 % (ref 11.5–15.5)
WBC: 14.4 10*3/uL — ABNORMAL HIGH (ref 4.0–10.5)
nRBC: 0 % (ref 0.0–0.2)

## 2022-10-12 LAB — GLUCOSE, CAPILLARY
Glucose-Capillary: 143 mg/dL — ABNORMAL HIGH (ref 70–99)
Glucose-Capillary: 166 mg/dL — ABNORMAL HIGH (ref 70–99)
Glucose-Capillary: 171 mg/dL — ABNORMAL HIGH (ref 70–99)
Glucose-Capillary: 198 mg/dL — ABNORMAL HIGH (ref 70–99)
Glucose-Capillary: 202 mg/dL — ABNORMAL HIGH (ref 70–99)
Glucose-Capillary: 230 mg/dL — ABNORMAL HIGH (ref 70–99)

## 2022-10-12 LAB — COMPREHENSIVE METABOLIC PANEL
ALT: 19 U/L (ref 0–44)
AST: 25 U/L (ref 15–41)
Albumin: 2.1 g/dL — ABNORMAL LOW (ref 3.5–5.0)
Alkaline Phosphatase: 110 U/L (ref 38–126)
Anion gap: 8 (ref 5–15)
BUN: 28 mg/dL — ABNORMAL HIGH (ref 6–20)
CO2: 20 mmol/L — ABNORMAL LOW (ref 22–32)
Calcium: 8.5 mg/dL — ABNORMAL LOW (ref 8.9–10.3)
Chloride: 119 mmol/L — ABNORMAL HIGH (ref 98–111)
Creatinine, Ser: 1.26 mg/dL — ABNORMAL HIGH (ref 0.61–1.24)
GFR, Estimated: >60 mL/min (ref >60)
Glucose, Bld: 170 mg/dL — ABNORMAL HIGH (ref 70–99)
Potassium: 3.8 mmol/L (ref 3.5–5.1)
Sodium: 147 mmol/L — ABNORMAL HIGH (ref 135–145)
Total Bilirubin: <0.1 mg/dL — ABNORMAL LOW (ref 0.3–1.2)
Total Protein: 6.6 g/dL (ref 6.5–8.1)

## 2022-10-12 LAB — BASIC METABOLIC PANEL
Anion gap: 8 (ref 5–15)
BUN: 27 mg/dL — ABNORMAL HIGH (ref 6–20)
CO2: 22 mmol/L (ref 22–32)
Calcium: 8.9 mg/dL (ref 8.9–10.3)
Chloride: 117 mmol/L — ABNORMAL HIGH (ref 98–111)
Creatinine, Ser: 1.31 mg/dL — ABNORMAL HIGH (ref 0.61–1.24)
GFR, Estimated: >60 mL/min (ref >60)
Glucose, Bld: 152 mg/dL — ABNORMAL HIGH (ref 70–99)
Potassium: 3.7 mmol/L (ref 3.5–5.1)
Sodium: 147 mmol/L — ABNORMAL HIGH (ref 135–145)

## 2022-10-12 LAB — CBC
HCT: 28.5 % — ABNORMAL LOW (ref 39.0–52.0)
Hemoglobin: 8.4 g/dL — ABNORMAL LOW (ref 13.0–17.0)
MCH: 27.9 pg (ref 26.0–34.0)
MCHC: 29.5 g/dL — ABNORMAL LOW (ref 30.0–36.0)
MCV: 94.7 fL (ref 80.0–100.0)
Platelets: 544 10*3/uL — ABNORMAL HIGH (ref 150–400)
RBC: 3.01 MIL/uL — ABNORMAL LOW (ref 4.22–5.81)
RDW: 15.5 % (ref 11.5–15.5)
WBC: 15.5 10*3/uL — ABNORMAL HIGH (ref 4.0–10.5)
nRBC: 0 % (ref 0.0–0.2)

## 2022-10-12 LAB — MAGNESIUM
Magnesium: 1.7 mg/dL (ref 1.7–2.4)
Magnesium: 1.7 mg/dL (ref 1.7–2.4)

## 2022-10-12 LAB — PHOSPHORUS: Phosphorus: 3.5 mg/dL (ref 2.5–4.6)

## 2022-10-12 MED ORDER — CLONIDINE HCL 0.2 MG PO TABS: 0.2000 mg | ORAL_TABLET | Freq: Two times a day (BID) | ORAL | Status: DC

## 2022-10-12 MED ORDER — METOPROLOL TARTRATE 100 MG PO TABS
100.0000 mg | ORAL_TABLET | Freq: Two times a day (BID) | ORAL | Status: DC
  Administered 2022-10-12 – 2022-10-13 (×2): 100 mg
  Filled 2022-10-12 (×2): qty 1

## 2022-10-12 MED ORDER — POTASSIUM CHLORIDE 20 MEQ PO PACK
40.0000 meq | PACK | Freq: Once | ORAL | Status: AC
  Administered 2022-10-12: 40 meq
  Filled 2022-10-12: qty 2

## 2022-10-12 MED ORDER — MAGNESIUM SULFATE 2 GM/50ML IV SOLN
2.0000 g | Freq: Once | INTRAVENOUS | Status: AC
  Administered 2022-10-12: 2 g via INTRAVENOUS
  Filled 2022-10-12: qty 50

## 2022-10-12 MED ORDER — CLONIDINE HCL 0.1 MG PO TABS
0.1000 mg | ORAL_TABLET | Freq: Two times a day (BID) | ORAL | Status: DC
  Administered 2022-10-12 – 2022-10-14 (×4): 0.1 mg via ORAL
  Filled 2022-10-12 (×4): qty 1

## 2022-10-12 NOTE — Progress Notes (Signed)
Patient noted to be in [facility] as evidenced by telemetry monitor. Upon entering room, patient was in [facility] with heart rate in 200's. Patient unresponsive and agonal respirations noted. Code blue initiated and patient placed in supine position and crash cart brought to the room. Patient then spontaneously converted from [facility] to SB with heart rate in the 20's. Heart rate began to climb up to the 60's and patient began spontaneous respirations. Radial pulse palpable. Respirations in teens. 100% nonrebreather mask placed on patient and sats noted to be 100%.  BP 165/81. Patient still unresponsive but Sinus rhythm noted on telemetry monitor. Code blue team present in room. MD notified and labs, cxr, and EKG ordered. Will continue to monitor.

## 2022-10-12 NOTE — Consult Note (Addendum)
Cardiology Consultation   Patient ID: Shane Chambers MRN: [id number]; DOB: 03-Mar-1961  Admit date: 10/02/2022 Date of Consult: 10/12/2022  PCP:  Caprice Renshaw, [place] Prov[facility] Cardiologist:  Carlyle Dolly, MD   {    Patient Profile:   Shane Chambers is a 61 y.o. male with a hx of CAD s/p CABG 03/2020 with( LIMA-LAD, RIMA-PL, RA-D1-RI-OM1), ischemic cardiomyopathy with recovered LVEF, chronic diastolic heart failure, COPD, depression,  hyperlipidemia, recurrent cellulitis,  who is being seen 10/12/2022 for the evaluation of ventricular tachycardia by Dr. Waldron Labs.  History of Present Illness:   Shane Chambers follows Dr. Harl Bowie outpatient, underwent CABG 03/2020 by Dr Orvan Seen with LIMA-LAD, RIMA-PL, RA-D1-RI-OM1 for multivessel CAD, has completed 1 year of DAPT.  He suffered ischemic cardiomyopathy with LVEF down to 20-25% on 03/06/2020 Echo.  LVEF has recovered to 60 to 65% on 04/30/2020 Echo.  He has chronic shortness of breath with exertion and lower extremity edema.  He was last seen by Dr. Harl Bowie in the office on 02/19/2022, doing well without anginal symptoms, leg edema was concerning for lymphedema versus heart failure, noted with some leg wound.  He was continued on medical therapy with aspirin 81 mg, Lipitor 80 mg, losartan 50 mg daily, metoprolol XL 25 mg twice daily, and torsemide 60 mg daily.  Patient has been admitted since 09/25/2022-10/01/2022 for right lower extremity cellulitis, blood culture from 09/25/2022 was positive for bacillus.  He returned to the hospital 10/02/2022 with worsening leg cellulitis symptoms.  CT scan revealed severe cellulitis with probable fasciitis, multiple complex fluid collection surrounding the medial and lateral aspect of ankle suspicion for abscess versus septic tenosynovitis, Erosive changes along the superficial cortex of the medial malleolus suspicious for osteomyelitis.  Chest x-ray from 11/30 concerning for left upper lobe  pneumonia.  He was admitted for sepsis 2/2 cellulitis/septic arthritis/osteomyelitis/pneumonia started on antibiotic.  He was seen by Ortho and underwent right below knee amputation and wound vac application on 62/22/97.  Course complicated by AKI on CKD stage III with creatinine peaked at 3.12 on 10/05/2022, etiology was felt due to prerenal AKI from underlying infection plus ARB-diuretic/vancomycin use.  Nephrology was consulted, patient was given fluids and supportive measures, AKI has resolved.  He then developed hyponatremia required D5W infusion as well as free water.  Course is further complicated by altered mental status, seen by psychiatry 10/10/2022, felt presentation is consistent with hypoactive delirium 2/2 infection, medication use, pain, Altru sleep cycle, limited mobility; suspicion for amitriptyline withdrawal was low, ultimately amitriptyline was discontinued due to concern of sedation.  Patient was noted new onset of ventricular tachycardia on telemetry on 10/11/2022 evening.  He was unresponsive with agonal breathing where CODE BLUE was initiated by nursing staff.  Hospitalist reports patient had transient runs of V. tach with heart rate at 200s beats per minute, had spontaneous conversion to sinus rhythm.  He did not lose pulse. He did not have complaints or symptoms.  Lab work completed at midnight revealed hyponatremia 147,  bicarb 20, BUN 28, creatinine 1.26, potassium 3.8, and magnesium 1.7.  CBC with leukocytosis 14 400, hemoglobin 9.1.  Chest x-ray revealed no pulmonary edema or infiltrate. EKG 10/11/2022 0028 revealed sinus rhythm at 67 bpm, QT prolongation with manual QT 542mec.  Cardiology is consulted today for concern of torsades.   Upon encounter, he states he is feeling well, has not had any chest pain, He does not have much memory about current hospitalization as he has  been here for a long time. He is aware of his leg infection. He was taking Effexor and amitriptyline PTA for  depression. He does not recall having any chest discomfort last night when code blue was called. 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 He is feeling overall improved and eating his first meal today.       Past Medical History:  Diagnosis Date   Anemia    Arthritis    CAD (coronary artery disease)    a. s/p CABG in 03/2020 with LIMA-LAD, RIMA-PL, RA-D1-RI-OM1   Cancer ([place])    rectal   Cellulitis    COPD (chronic obstructive pulmonary disease) ([place])    Depression    Diabetes mellitus without complication (HCC)    GERD (gastroesophageal reflux disease)    History of kidney stones    Hyperlipidemia 12/01/2019   Hypertension    Hypothyroidism    Ischemic cardiomyopathy    a. EF 20-25% by echo in 02/2020 b. at 40% by echo in 03/2020 c. EF normalized to 60-65% by echo in 04/2020   Myocardial infarction [facility])    Peripheral vascular disease ([place])    Sleep apnea    Type 2 diabetes mellitus ([place])     Past Surgical History:  Procedure Laterality Date   AMPUTATION Right 10/05/2022   Procedure: AMPUTATION BELOW KNEE;  Surgeon: Newt Minion, MD;  Location: [facility];  Service: Orthopedics;  Laterality: Right;   APPLICATION OF WOUND VAC Right 10/05/2022   Procedure: APPLICATION OF WOUND VAC;  Surgeon: Newt Minion, MD;  Location: [facility];  Service: Orthopedics;  Laterality: Right;   BIOPSY  07/18/2021   Procedure: BIOPSY;  Surgeon: Eloise Harman, DO;  [facility];  Service: Endoscopy;;   BIOPSY  01/09/2022   Procedure: BIOPSY;  Surgeon: Eloise Harman, DO;  [facility];  Service: Endoscopy;;   COLONOSCOPY WITH PROPOFOL N/A 07/18/2021   Carver: 15 millimeter polyp removed from the sigmoid colon, 5 mm polyp removed from sigmoid colon.  Nonbleeding internal hemorrhoids.  Significant looping of the colon. sigmoid path showed invasive colonic adenocarcinoma involving tubular adenoma (invades to depth of 40m, carcinoma 130mfrom margin, no  lymphovascular invasion, no poorly differentiated component.   CORONARY ARTERY BYPASS GRAFT N/A 03/13/2020   Procedure: CORONARY ARTERY BYPASS GRAFTING (CABG) times five using bilateral Internal mammary arteries and left radial artery;  Surgeon: AtWonda OldsMD;  [facility] Service: Open [facility] Surgery;  Laterality: N/A;   DENTAL SURGERY     ESOPHAGOGASTRODUODENOSCOPY (EGD) WITH PROPOFOL N/A 07/18/2021   Carver: 1 gastric polyp status post biopsy, gastritis. gastric bx with slight chronic inflammation and no H.pyori. GEJ polypectomy with mild inflammation only   FLEXIBLE SIGMOIDOSCOPY N/A 08/26/2021   Carver: Nonbleeding internal hemorrhoids.  15 mm ulcers from previous polypectomy found in the rectum.  No evidence of previous polyp.  Located 5 to 8 cm from anal verge.   FLEXIBLE SIGMOIDOSCOPY N/A 01/09/2022   Procedure: FLEXIBLE SIGMOIDOSCOPY;  Surgeon: CaEloise HarmanDO;  [facility];  Service: Endoscopy;  Laterality: N/A;   POLYPECTOMY  07/18/2021   Procedure: POLYPECTOMY INTESTINAL;  Surgeon: CaEloise HarmanDO;  [facility];  Service: Endoscopy;;   RADIAL ARTERY HARVEST Left 03/13/2020   Procedure: RAEvans  Surgeon: AtWonda OldsMD;  [place] Service: Open Heart Surgery;  Laterality: Left;   RIGHT/LEFT HEART CATH  AND CORONARY ANGIOGRAPHY N/A 03/07/2020   Procedure: RIGHT/LEFT HEART CATH AND CORONARY ANGIOGRAPHY;  Surgeon: Sherren Mocha, MD;  Location: [facility];  Service: Cardiovascular;  Laterality: N/A;   SUBMUCOSAL LIFTING INJECTION  01/09/2022   Procedure: SUBMUCOSAL LIFTING INJECTION;  Surgeon: Eloise Harman, DO;  [facility];  Service: Endoscopy;;   SUBMUCOSAL TATTOO INJECTION  01/09/2022   Procedure: SUBMUCOSAL TATTOO INJECTION;  Surgeon: Eloise Harman, DO;  [facility];  Service: Endoscopy;;   TEE WITHOUT CARDIOVERSION N/A 03/13/2020   Procedure: TRANSESOPHAGEAL  ECHOCARDIOGRAM (TEE);  Surgeon: [name], MD;  Location: [facility];  Service: Open Heart Surgery;  Laterality: N/A;     Home Medications:  Prior to Admission medications   Medication Sig Start Date End Date Taking? Authorizing Provider  acetaminophen (TYLENOL) 500 MG tablet Take 1,000 mg by mouth every 8 (eight) hours as needed for fever or moderate pain.   Yes [provider]  albuterol (VENTOLIN HFA) 108 (90 Base) MCG/ACT inhaler Inhale 2 puffs into the lungs every 6 (six) hours as needed for wheezing or shortness of breath. 05/02/22  Yes Johnson, Clanford L, MD  amitriptyline (ELAVIL) 75 MG tablet Take 75 mg by mouth at bedtime. 08/29/22  Yes [provider]  aspirin 81 MG EC tablet Take 1 tablet (81 mg total) by mouth daily with breakfast. 11/29/20  Yes Emokpae, Courage, MD  atorvastatin (LIPITOR) 80 MG tablet Take 80 mg by mouth at bedtime.   Yes [provider]  budesonide-formoterol (SYMBICORT) 160-4.5 MCG/ACT inhaler Inhale 2 puffs into the lungs in the morning and at bedtime. 05/24/20  Yes Rigoberto Noel, MD  Cholecalciferol (VITAMIN D) 50 MCG (2000 UT) tablet Take 2,000 Units by mouth daily.   Yes [provider]  docusate sodium (COLACE) 100 MG capsule Take 100 mg by mouth daily. 09/23/22  Yes [provider]  FERROUS GLUCONATE PO Take 325 mg by mouth daily. 09/23/22  Yes [provider]  gabapentin (NEURONTIN) 300 MG capsule Take 2 capsules (600 mg total) by mouth 2 (two) times daily. 05/02/22  Yes Johnson, Clanford L, MD  insulin aspart protamine- aspart (NOVOLOG MIX 70/30) (70-30) 100 UNIT/ML injection Inject 0.08 mLs (8 Units total) into the skin 2 (two) times daily with a meal. 05/02/22  Yes Johnson, Clanford L, MD  levothyroxine (SYNTHROID) 75 MCG tablet Take 75 mcg by mouth daily before breakfast.   Yes [provider]  loperamide HCl (IMODIUM) 2 MG/15ML solution Take 4 mg by mouth every 6 (six) hours as needed for  diarrhea or loose stools.   Yes [provider]  losartan (COZAAR) 50 MG tablet Take 50 mg by mouth daily.   Yes [provider]  magnesium oxide (MAG-OX) 400 MG tablet Take 400 mg by mouth daily.   Yes [provider]  metFORMIN (GLUCOPHAGE) 1000 MG tablet Take 1,000 mg by mouth 2 (two) times daily.   Yes [provider]  metoprolol succinate (TOPROL-XL) 25 MG 24 hr tablet Take 0.5 tablets (12.5 mg total) by mouth 2 (two) times daily. 08/21/20  Yes Verta Ellen., NP  Multiple Vitamin (MULTIVITAMIN) tablet Take 1 tablet by mouth daily.   Yes [provider]  omeprazole (PRILOSEC) 40 MG capsule Take 40 mg by mouth daily. 01/01/22  Yes [provider]  polyethylene glycol (MIRALAX / GLYCOLAX) 17 g packet Take 17 g by mouth daily as needed for moderate constipation.   Yes [provider]  polyvinyl alcohol (LIQUIFILM TEARS) 1.4 % ophthalmic solution Place 1 drop into both eyes daily.   Yes [provider]  potassium chloride SA (KLOR-CON M) 20 MEQ tablet Take 1 tablet (20 mEq total) by mouth daily. 05/05/22  Yes Johnson, Clanford L, MD  tamsulosin (FLOMAX) 0.4 MG CAPS capsule Take 0.8 mg by mouth daily.   Yes [provider]  torsemide (DEMADEX) 20 MG tablet Take 3 tablets (60 mg total) by mouth daily. 05/03/22  Yes Johnson, Clanford L, MD  venlafaxine XR (EFFEXOR-XR) 75 MG 24 hr capsule Take 225 mg by mouth daily. 01/21/22  Yes [provider]  Amino Acids-Protein Hydrolys (PRO-STAT AWC PO) Take 30 mLs by mouth 2 (two) times daily.    [provider]    Inpatient Medications: Scheduled Meds:  amLODipine  10 mg Per Tube Daily   arformoterol  15 mcg Nebulization BID   aspirin EC  81 mg Oral Q breakfast   atorvastatin  80 mg Per Tube QHS   budesonide (PULMICORT) nebulizer solution  0.25 mg Nebulization BID   cloNIDine  0.2 mg Transdermal Weekly   feeding supplement (PROSource TF20)  60 mL Per Tube  Daily   free water  300 mL Per Tube Q3H   heparin  5,000 Units Subcutaneous Q8H   insulin aspart  0-15 Units Subcutaneous Q4H   ipratropium-albuterol  3 mL Nebulization BID   levothyroxine  75 mcg Per Tube QAC breakfast   metoprolol tartrate  75 mg Per Tube BID   oxyCODONE  5 mg Per Tube Q4H   phosphorus  250 mg Per Tube BID   tamsulosin  0.8 mg Oral Daily   Continuous Infusions:  cefTRIAXone (ROCEPHIN)  IV 2 g (10/12/22 1005)   dextrose 100 mL/hr at 10/12/22 1003   feeding supplement (JEVITY 1.5 CAL/FIBER) 1,000 mL (10/12/22 0510)   magnesium sulfate bolus IVPB 2 g (10/12/22 1100)   metronidazole 500 mg (10/11/22 2050)   thiamine (VITAMIN B1) injection 500 mg (10/11/22 1052)   PRN Meds: haloperidol lactate, HYDROcodone-acetaminophen, labetalol, ondansetron **OR** ondansetron (ZOFRAN) IV, polyethylene glycol  Allergies:    Allergies  Allergen Reactions   Ace Inhibitors Swelling and Cough    (Not on MAR at [facility])   Other Itching    [name]   Shellfish Allergy    Penicillins Itching and Rash    Has patient had a PCN reaction causing immediate rash, facial/tongue/throat swelling, SOB or lightheadedness with hypotension: Unknown Has patient had a PCN reaction causing severe rash involving mucus membranes or skin necrosis: Unknown Has patient had a PCN reaction that required hospitalization: Unknown Has patient had a PCN reaction occurring within the last 10 years: Unknown If all of the above answers are "NO", then may proceed with Cephalosporin use.     Social History:   Social History   Socioeconomic History   Marital status: Single    Spouse name: Not on file   Number of children: Not on file   Years of education: Not on file   Highest education level: Not on file  Occupational History   Not on file  Tobacco Use   Smoking status: Former    Packs/day: 1.50    Types: Cigarettes    Quit date: 05/25/1995    Years since quitting:  27.4   Smokeless tobacco: Never  Vaping Use   Vaping Use: Never used  Substance and Sexual Activity   Alcohol use: Not Currently  Comment: rarely   Drug use: No   Sexual activity: Not on file  Other Topics Concern   Not on file  Social History Narrative   Not on file   Social Determinants of Health   Financial Resource Strain: Not on file  Food Insecurity: No Food Insecurity (09/26/2022)   Hunger Vital Sign    Worried About Running Out of Food in the Last Year: Never true    Ran Out of Food in the Last Year: Never true  Transportation Needs: No Transportation Needs (09/26/2022)   PRAPARE - Hydrologist (Medical): No    Lack of Transportation (Non-Medical): No  Physical Activity: Not on file  Stress: Not on file  Social Connections: Not on file  Intimate Partner Violence: Not At Risk (09/26/2022)   Humiliation, Afraid, Rape, and Kick questionnaire    Fear of Current or Ex-Partner: No    Emotionally Abused: No    Physically Abused: No    Sexually Abused: No    Family History:    Family History  Problem Relation Age of Onset   Hypertension Mother      ROS:  Constitutional: Denied fever, chills, malaise, night sweats Eyes: Denied vision change or loss Ears/Nose/Mouth/Throat: Denied ear ache, sore throat, coughing, sinus pain Cardiovascular: see HPI  Respiratory: denied shortness of breath Gastrointestinal: Denied nausea, vomiting, abdominal pain, diarrhea Genital/Urinary: Denied dysuria, hematuria, urinary frequency/urgency Musculoskeletal: Denied muscle ache, joint pain, weakness Skin: see HPI  Neuro: Denied headache, dizziness, syncope Psych: Denied history of depression/anxiety  Endocrine: Denied history of diabetes   Physical Exam/Data:   Vitals:   10/12/22 0104 10/12/22 0400 10/12/22 0852 10/12/22 0900  BP: (!) 153/82 (!) 159/77    Pulse: 66 66 78   Resp: '18 18 16   '$ Temp:  98.2 F (36.8 C)    TempSrc:  Oral    SpO2:  100% 100%  100%  Weight:      Height:        Intake/Output Summary (Last 24 hours) at 10/12/2022 1128 Last data filed at 10/12/2022 0516 Gross per 24 hour  Intake --  Output 1100 ml  Net -1100 ml      10/05/2022   10:26 AM 10/01/2022    5:13 AM 09/30/2022    5:08 AM  Last 3 Weights  Weight (lbs) 260 lb 2.3 oz 260 lb 2.3 oz 258 lb 2.5 oz  Weight (kg) 118 kg 118 kg 117.1 kg     Body mass index is 38.4 kg/m.   Vitals:  Vitals:   10/12/22 0852 10/12/22 0900  BP:    Pulse: 78   Resp: 16   Temp:    SpO2:  100%   General Appearance: In no apparent distress, laying in bed, chronic ill appearing  HEENT: Normocephalic, atraumatic.  Neck: Supple, trachea midline, no JVDs Cardiovascular: Regular rate and rhythm, normal S1-S2,  no murmur Respiratory: Resting breathing unlabored, lungs sounds clear to auscultation bilaterally, no use of accessory muscles. On [place] oxygen.  Gastrointestinal: Bowel sounds positive, abdomen soft, Dobhoff tube in place  Extremities: S/P R BKA, immobilizer in place, LLE with scabbed and erythema  Musculoskeletal: Normal muscle bulk and tone Skin: Intact, warm, dry. No rashes or petechiae noted in exposed areas.  Neurologic: Alert, oriented to person, place and time. Memory deficit. No focal deficit  Psychiatric: Normal affect. Mood is appropriate.      EKG:  The EKG was personally reviewed and demonstrates:  EKG from 10/11/2022  0028 revealed sinus rhythm 67 bpm, TWI lead I and aVL, prolonged QT with manual QT 507 msec  EKG 10/12/2022 0052 revealed sinus rhythm at 70, TWI of lead I and aVL, manual QT 475 msec  Telemetry:  Telemetry was personally reviewed and demonstrates:    Sinus rhythm 60s currently  2342-2344 on 10/11/22 Torsades de pointes with VR up to 230s   Relevant CV Studies:  Echocardiogram from today is pending  Echo from 08/03/2020:  1. Left ventricular ejection fraction, by estimation, is 55 to 60%. The  left ventricle has  normal function. The left ventricle has no regional  wall motion abnormalities. There is mild left ventricular hypertrophy.  Left ventricular diastolic parameters  are consistent with Grade II diastolic dysfunction (pseudonormalization).  Elevated left atrial pressure.   2. There is moderate calcification of the aortic valve. There is moderate  thickening of the aortic valve.   3. Limited echo to evaluate LV function    Laboratory Data:  High Sensitivity Troponin:  No results for input(s): "TROPONINIHS" in the last 720 hours.   Chemistry Recent Labs  Lab 10/11/22 0153 10/11/22 1530 10/12/22 0030 10/12/22 0155  NA 151* 149* 147* 147*  K 3.5 3.7 3.8 3.7  CL 122* 122* 119* 117*  CO2 21* 19* 20* 22  GLUCOSE 199* 171* 170* 152*  BUN 31* 28* 28* 27*  CREATININE 1.48* 1.27* 1.26* 1.31*  CALCIUM 9.2 8.5* 8.5* 8.9  MG 1.8  --  1.7 1.7  GFRNONAA 54* >60 >60 >60  ANIONGAP '8 8 8 8    '$ Recent Labs  Lab 10/07/22 0551 10/12/22 0030  PROT  --  6.6  ALBUMIN 2.1* 2.1*  AST  --  25  ALT  --  19  ALKPHOS  --  110  BILITOT  --  <0.1*   Lipids No results for input(s): "CHOL", "TRIG", "HDL", "LABVLDL", "LDLCALC", "CHOLHDL" in the last 168 hours.  Hematology Recent Labs  Lab 10/11/22 0153 10/12/22 0030 10/12/22 0155  WBC 16.4* 14.4* 15.5*  RBC 3.13* 3.18* 3.01*  HGB 8.9* 9.1* 8.4*  HCT 28.5* 29.5* 28.5*  MCV 91.1 92.8 94.7  MCH 28.4 28.6 27.9  MCHC 31.2 30.8 29.5*  RDW 15.7* 15.4 15.5  PLT 655* 489* 544*   Thyroid No results for input(s): "TSH", "FREET4" in the last 168 hours.  BNPNo results for input(s): "BNP", "PROBNP" in the last 168 hours.  DDimer No results for input(s): "DDIMER" in the last 168 hours.   Radiology/Studies:  DG CHEST PORT 1 VIEW  Result Date: 10/12/2022 CLINICAL DATA:  [id number] with ventricular arrhythmia. EXAM: PORTABLE CHEST 1 VIEW COMPARISON:  Portable chest 10/09/2022 FINDINGS: 12:30 a.m. A feeding tube has been inserted. The Dobbhoff tip is in the  gastric antrum. Median sternotomy sutures and CABG changes. There is mild cardiomegaly and mild central vascular prominence but no overt edema. There are low lung volumes, particularly on the right where there is elevation of the hemidiaphragm to the mid hilar level. There are perihilar atelectatic bands in the lungs. No focal pneumonic process is seen but please note the right lower lung field is obscured by the elevated right diaphragm. No significant pleural effusion is seen. The mediastinum is normally outlined. Thoracic spondylosis and degenerative disc disease. The IMPRESSION: 1. Mild cardiomegaly and mild central vascular prominence but no overt edema. 2. Low lung volumes, particularly on the right where there is elevation of the hemidiaphragm. Perihilar atelectatic bands. 3. No visible infiltrate with  right lower lung field obscured. 4. Feeding tube radiopaque tip in the gastric antrum. Electronically Signed   By: Telford Nab M.D.   On: 10/12/2022 00:48   DG Abd Portable 1V  Result Date: 10/10/2022 CLINICAL DATA:  [id number] Encounter for feeding tube placement [id number] EXAM: PORTABLE ABDOMEN - 1 VIEW COMPARISON:  None Available. FINDINGS: Feeding tube tip overlies the stomach. Unremarkable upper abdominal bowel gas pattern. IMPRESSION: Feeding tube tip overlies the stomach. Electronically Signed   By: Maurine Simmering M.D.   On: 10/10/2022 09:46   DG Chest Port 1 View  Result Date: 10/09/2022 CLINICAL DATA:  [id number] Fever [id number] EXAM: PORTABLE CHEST 1 VIEW COMPARISON:  Chest x-ray 09/25/2022, chest x-ray 04/22/2022 FINDINGS: The heart and mediastinal contours are unchanged. Developing hazy airspace opacity along the left upper lobe. No pulmonary edema. No pleural effusion. No pneumothorax. No acute osseous abnormality. Similar-appearing fractured sternotomy wires. IMPRESSION: 1. Developing hazy airspace opacity along the left upper lobe. 2. Similar-appearing fractured sternotomy wires. Electronically Signed    By: Iven Finn M.D.   On: 10/09/2022 14:52     Assessment and Plan:   Torsades de pointes QT prolongation  - ECG serial reviewed from 10/05/22 to 10/11/22, noted new onset of QT prolongation - QT prolongation likely is the cause of torsades, patient has been receiving QT prolonging agents including amitriptyline, venlafaxine, metronidazole, Haldol, and Zofran per MAR review since 10/03/22  - Please avoid QT prolonging medication such as psychotropic drugs (SSRI, TCAs, SNRI), haldol, fluoroquinolone, Flagyl, Zofran, antihistamines,etc - Please maintain electrolytes to keep K above 4 and magnesium above 2 - Echocardiogram has been ordered by primary team, will follow - Continue metoprolol 75 mg twice daily - May call back to cardiology if there is further concern  CAD with history of CABG 2021 -Has been doing well post CABG anginal symptoms -Continue medical therapy with aspirin 81, Lipitor 80, metoprolol 25 twice daily -Will follow-up on echocardiogram  Ischemic cardiomyopathy with recovered LVEF Chronic diastolic heart failure -Last echo from 07/30/2020 with LVEF recovered to 55 to 60%, no regional wall motion abnormality, grade 2 diastolic dysfunction -Clinically euvolemic -Chest x-ray today revealed no overt pulmonary edema -May diuresis as needed with as needed torsemide  Sepsis secondary to leg cellulitis/septic arthritis/osteomyelitis Pneumonia, healthcare associated Acute metabolic encephalopathy with hypoactive delirium Hypernatremia Anemia Type 2 diabetes Hypothyroidism BPH - per IM  Risk Assessment/Risk Scores:   For questions or updates, please contact [place] Please consult [email or web address] for contact info under    Signed, Margie Billet, NP  10/12/2022 11:28 AM   EP Attending  Patient seen and examined. Agree with above. The patient has torsades due to a combination of QT prolonging medications as nicely outlined by NP Maylon Peppers above. He is feeling  better. Agree with uptitration of the beta blocker as you have done. If the patient were to have more torsades off of the QT prolonging meds, then switching to nadolol or propranolol would be appropriate. Avoid hypokalemia or hypomagnesemia.   Carleene Overlie Val[place]e Agent,MD

## 2022-10-12 NOTE — Progress Notes (Signed)
[facility] Progress Note  Patient: Shane Chambers     FBP:[id number]  DOA: 10/02/2022   PCP: Caprice Renshaw, MD       Brief hospital course: This is a 61 year old male with diabetes mellitus, coronary artery disease status post CABG, COPD, hypertension, coronary artery disease, peripheral vascular disease who presents to the hospital with right leg pain and swelling. The patient was hospitalized from 11/16 through 11/22 for cellulitis of the right leg, treated with ceftriaxone and vancomycin and discharged with doxycycline and cefadroxil. Blood cultures grew Bacillus species which was felt to be a contaminant.  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  Patient hospital course was complicated by significant encephalopathy and altered mental status.   Subjective:   Patient with an episode of torsades de points yesterday for which CODE BLUE had been called, he never lost pulse.   Assessment and Plan: Principal Problem: Cellulitis-septic arthritis/osteomyelitis - WBC noted to be 18.6 - Imaging with CT scan and MRI suggestive of right ankle septic arthritis with osteomyelitis.  - 11/26- s/p AKA - has been receiving vancomycin and cefepime - recommended to continue x 3 days post op per Dr Sharol Given -Further management per Dr. Sharol Given.  Fever/PNA - patient with temperature 100.4 11/30, chest x-ray significant for right upper lung opacity.  Was treated with IV Rocephin and Flagyl for aspiration pneumonia, -IV Flagyl discontinued given torsades.  Delirium Acute metabolic encephalopathy -Certainly with some underlying vascular dementia, as has been living at nursing home for last 2 years, with evidence of significant trophy and CVA on CT head obtained yesterday. -Patient is screaming/yelling intermittently, unable to answer any commands or follow any questions . -Hospital delirium contributing, will keep on as needed Haldol. -Minimize narcotics and  sedative medications -Patient mentation much improved, tolerating p.o. diet  Hypernatremia -Proving with D5W and free water via tube.   AKI with metabolic acidosis - Baseline creatinine is about 0.9-1.4 - Creatinine has been steadily rising peaked at 3.06 -Continue to hold losartan and torsemide - have asked for nephrology consult - renal ultrasound unremarkable - Cr is now improving  Acute blood loss anemia-anemia of chronic disease -Hemoglobin 7.7 today - transfused 1 U PRBC on 11/25 and again on 11/27 - anemia panel was obtained on 11/20-ferritin was 913 - iron saturation was low as was iron binding - IV Ferric gluconate given on 11/27 - cont to follow Hgb- has bloody drainage in wound vac and may need further transfusions    Type 2 diabetes mellitus ([place]) -Home medications include 70/30 and Glucophage -NovoLog sliding scale ordered -Hemoglobin A1C    Component Value Date/Time   HGBA1C 5.6 09/26/2022 0404   Hypothyroidism - Continue levothyroxine  Chronic diastolic heart failure/hypertension - Last echo from 2021 revealed grade 2 diastolic dysfunction with an EF of 55 to 60% - Diuretics currently on hold due to AKI -Continue metoprolol  History of coronary artery disease status post CABG - Continue aspirin and Lipitor  Hypertension -He is taking oral medications, will change clonidine patch to p.o., will uptitrate his metoprolol further given torsades.  Continue with as needed metoprolol and hydralazine.    Torsades de pointes QTc -Cardiology input greatly appreciated, avoid prolonging agents, keep potassium>, magnesium> 2. -Will uptitrate metoprolol to 100 mg p.o. twice daily.  BPH - Continue Flomax  Hypokalemia - repleted, recheck in am.     Code Status: Full Code Consultants: Orthopedic surgery Level of Care: Level of care: Telemetry Medical Family communication:  Discussed with mother at bedside 11/30, 12/2 DVT prophylaxis:  heparin injection 5,000  Units Start: 10/03/22 0600 SCDs Start: 10/03/22 0127     Objective:   Vitals:   10/12/22 0104 10/12/22 0400 10/12/22 0852 10/12/22 0900  BP: (!) 153/82 (!) 159/77    Pulse: 66 66 78   Resp: '18 18 16   '$ Temp:  98.2 F (36.8 C)    TempSrc:  Oral    SpO2: 100% 100%  100%  Weight:      Height:       Filed Weights   10/05/22 1026  Weight: 118 kg   Exam:  Awake alert oriented x 3, he is very appropriate and coherent today, frail, chronically ill-appearing Awake Alert, Oriented X 3, No new F.N deficits, Normal affectSymmetrical Chest wall movement, Good air movement bilaterally, CTAB RRR,No Gallops,Rubs or new Murmurs, No Parasternal Heave +ve B.Sounds, Abd Soft, No tenderness, No rebound - guarding or rigidity. No Cyanosis, Clubbing or edema, right BKA   CBC: Recent Labs  Lab 10/06/22 0641 10/07/22 0551 10/09/22 0637 10/10/22 0456 10/11/22 0153 10/12/22 0030 10/12/22 0155  WBC 17.3*   < > 12.0* 13.9* 16.4* 14.4* 15.5*  NEUTROABS 13.7*  --   --   --   --  10.0*  --   HGB 7.0*   < > 8.1* 9.2* 8.9* 9.1* 8.4*  HCT 21.4*   < > 25.2* 28.6* 28.5* 29.5* 28.5*  MCV 87.7   < > 90.0 89.1 91.1 92.8 94.7  PLT 473*   < > 634* 667* 655* 489* 544*   < > = values in this interval not displayed.   Basic Metabolic Panel: Recent Labs  Lab 10/07/22 0551 10/08/22 0744 10/10/22 0456 10/10/22 1522 10/11/22 0153 10/11/22 1530 10/12/22 0030 10/12/22 0155  NA 138   < > 152* 152* 151* 149* 147* 147*  K 4.8   < > 3.3* 4.0 3.5 3.7 3.8 3.7  CL 109   < > 116* 120* 122* 122* 119* 117*  CO2 20*   < > 20* 20* 21* 19* 20* 22  GLUCOSE 89   < > 191* 231* 199* 171* 170* 152*  BUN 60*   < > 29* 31* 31* 28* 28* 27*  CREATININE 2.62*   < > 1.37* 1.35* 1.48* 1.27* 1.26* 1.31*  CALCIUM 9.0   < > 9.5 10.3 9.2 8.5* 8.5* 8.9  MG  --   --  1.7  --  1.8  --  1.7 1.7  PHOS 5.1*  --  2.5  --  2.6  --   --  3.5   < > = values in this interval not displayed.   GFR: Estimated Creatinine Clearance: 76.9  mL/min (A) (by C-G formula based on SCr of 1.31 mg/dL (H)).  Scheduled Meds:  amLODipine  10 mg Per Tube Daily   arformoterol  15 mcg Nebulization BID   aspirin EC  81 mg Oral Q breakfast   atorvastatin  80 mg Per Tube QHS   budesonide (PULMICORT) nebulizer solution  0.25 mg Nebulization BID   cloNIDine  0.2 mg Transdermal Weekly   feeding supplement (PROSource TF20)  60 mL Per Tube Daily   free water  300 mL Per Tube Q3H   heparin  5,000 Units Subcutaneous Q8H   insulin aspart  0-15 Units Subcutaneous Q4H   ipratropium-albuterol  3 mL Nebulization BID   levothyroxine  75 mcg Per Tube QAC breakfast   metoprolol tartrate  75 mg Per Tube  BID   oxyCODONE  5 mg Per Tube Q4H   phosphorus  250 mg Per Tube BID   tamsulosin  0.8 mg Oral Daily   Continuous Infusions:  cefTRIAXone (ROCEPHIN)  IV 2 g (10/12/22 1005)   dextrose 100 mL/hr at 10/12/22 1003   feeding supplement (JEVITY 1.5 CAL/FIBER) 1,000 mL (10/12/22 0510)   metronidazole 500 mg (10/12/22 1226)   thiamine (VITAMIN B1) injection 500 mg (10/11/22 1052)   Imaging and lab data was personally reviewed DG CHEST PORT 1 VIEW  Result Date: 10/12/2022 CLINICAL DATA:  [id number] with ventricular arrhythmia. EXAM: PORTABLE CHEST 1 VIEW COMPARISON:  Portable chest 10/09/2022 FINDINGS: 12:30 a.m. A feeding tube has been inserted. The Dobbhoff tip is in the gastric antrum. Median sternotomy sutures and CABG changes. There is mild cardiomegaly and mild central vascular prominence but no overt edema. There are low lung volumes, particularly on the right where there is elevation of the hemidiaphragm to the mid hilar level. There are perihilar atelectatic bands in the lungs. No focal pneumonic process is seen but please note the right lower lung field is obscured by the elevated right diaphragm. No significant pleural effusion is seen. The mediastinum is normally outlined. Thoracic spondylosis and degenerative disc disease. The IMPRESSION: 1. Mild  cardiomegaly and mild central vascular prominence but no overt edema. 2. Low lung volumes, particularly on the right where there is elevation of the hemidiaphragm. Perihilar atelectatic bands. 3. No visible infiltrate with right lower lung field obscured. 4. Feeding tube radiopaque tip in the gastric antrum. Electronically Signed   By: Telford Nab M.D.   On: 10/12/2022 00:48    LOS: 10 days   Author: Phillips Climes MD 10/12/2022 1:42 PM  To contact [facility]>   Check the care team in [facility] and look for the attending/consulting [place] provider listed  Log into [email or web address] and use [place]'s universal password   Go to> "[facility]"  and find provider  If you still have difficulty reaching the provider, please page the [facility] (Director on Call) for the Hospitalists listed on amion

## 2022-10-12 NOTE — Progress Notes (Signed)
[facility] night cross cover note:   I was notified of transient run of V tach noted on tele, with HR reported up to 200 bpm, although specific duration not clear, before spontaneously converting back to SR. Patient with pulse. Other VSS. No new patient reported symptoms immediately before or after the above. Will pursue STAT EKG, as well as updated labs including cmp, mag, cbc, trop. Will also check cxr. Noted to be on oral metoprolol tartrate. Considered increasing dose of BB, although noted HR's currently in the 60's, so no adjustment to BB dose was made at this time.     Babs Bertin, DO Hospitalist

## 2022-10-12 NOTE — Progress Notes (Signed)
Responded to Code [employer]. Code was cancelled, patient being cared for by medical staff. No needs from the chaplain, will follow up with patient.

## 2022-10-13 ENCOUNTER — Inpatient Hospital Stay (HOSPITAL_COMMUNITY): Payer: Medicare Other

## 2022-10-13 DIAGNOSIS — I10 Essential (primary) hypertension: Secondary | ICD-10-CM | POA: Diagnosis not present

## 2022-10-13 DIAGNOSIS — I4721 Torsades de pointes: Secondary | ICD-10-CM | POA: Diagnosis not present

## 2022-10-13 DIAGNOSIS — I472 Ventricular tachycardia, unspecified: Secondary | ICD-10-CM | POA: Diagnosis not present

## 2022-10-13 DIAGNOSIS — M86271 Subacute osteomyelitis, right ankle and foot: Secondary | ICD-10-CM | POA: Diagnosis not present

## 2022-10-13 DIAGNOSIS — I5032 Chronic diastolic (congestive) heart failure: Secondary | ICD-10-CM

## 2022-10-13 DIAGNOSIS — F05 Delirium due to known physiological condition: Secondary | ICD-10-CM

## 2022-10-13 LAB — ECHOCARDIOGRAM COMPLETE
AR max vel: 2.01 cm2
AV Area VTI: 2.07 cm2
AV Area mean vel: 1.9 cm2
AV Mean grad: 10.5 mmHg
AV Peak grad: 18.1 mmHg
Ao pk vel: 2.13 m/s
Area-P 1/2: 4.31 cm2
Height: 69.016 in
S' Lateral: 3.6 cm
Weight: 4162.28 oz

## 2022-10-13 LAB — BASIC METABOLIC PANEL
Anion gap: 7 (ref 5–15)
BUN: 29 mg/dL — ABNORMAL HIGH (ref 6–20)
CO2: 23 mmol/L (ref 22–32)
Calcium: 8.2 mg/dL — ABNORMAL LOW (ref 8.9–10.3)
Chloride: 109 mmol/L (ref 98–111)
Creatinine, Ser: 1.15 mg/dL (ref 0.61–1.24)
GFR, Estimated: >60 mL/min (ref >60)
Glucose, Bld: 168 mg/dL — ABNORMAL HIGH (ref 70–99)
Potassium: 3.8 mmol/L (ref 3.5–5.1)
Sodium: 139 mmol/L (ref 135–145)

## 2022-10-13 LAB — GLUCOSE, CAPILLARY
Glucose-Capillary: 118 mg/dL — ABNORMAL HIGH (ref 70–99)
Glucose-Capillary: 133 mg/dL — ABNORMAL HIGH (ref 70–99)
Glucose-Capillary: 146 mg/dL — ABNORMAL HIGH (ref 70–99)
Glucose-Capillary: 155 mg/dL — ABNORMAL HIGH (ref 70–99)
Glucose-Capillary: 169 mg/dL — ABNORMAL HIGH (ref 70–99)
Glucose-Capillary: 174 mg/dL — ABNORMAL HIGH (ref 70–99)
Glucose-Capillary: 181 mg/dL — ABNORMAL HIGH (ref 70–99)

## 2022-10-13 LAB — CBC
HCT: 26.9 % — ABNORMAL LOW (ref 39.0–52.0)
Hemoglobin: 8.2 g/dL — ABNORMAL LOW (ref 13.0–17.0)
MCH: 28.9 pg (ref 26.0–34.0)
MCHC: 30.5 g/dL (ref 30.0–36.0)
MCV: 94.7 fL (ref 80.0–100.0)
Platelets: 361 10*3/uL (ref 150–400)
RBC: 2.84 MIL/uL — ABNORMAL LOW (ref 4.22–5.81)
RDW: 15.2 % (ref 11.5–15.5)
WBC: 14.3 10*3/uL — ABNORMAL HIGH (ref 4.0–10.5)
nRBC: 0 % (ref 0.0–0.2)

## 2022-10-13 LAB — MAGNESIUM: Magnesium: 1.6 mg/dL — ABNORMAL LOW (ref 1.7–2.4)

## 2022-10-13 LAB — TROPONIN I (HIGH SENSITIVITY)
Troponin I (High Sensitivity): 49 ng/L — ABNORMAL HIGH (ref <18)
Troponin I (High Sensitivity): 50 ng/L — ABNORMAL HIGH (ref <18)

## 2022-10-13 LAB — PHOSPHORUS: Phosphorus: 3.1 mg/dL (ref 2.5–4.6)

## 2022-10-13 MED ORDER — POTASSIUM CHLORIDE 20 MEQ PO PACK
40.0000 meq | PACK | Freq: Once | ORAL | Status: AC
  Administered 2022-10-13: 40 meq
  Filled 2022-10-13: qty 2

## 2022-10-13 MED ORDER — FREE WATER
200.0000 mL | Status: DC
  Administered 2022-10-13 – 2022-10-17 (×21): 200 mL

## 2022-10-13 MED ORDER — MAGNESIUM OXIDE -MG SUPPLEMENT 400 (240 MG) MG PO TABS
400.0000 mg | ORAL_TABLET | Freq: Two times a day (BID) | ORAL | Status: DC
  Administered 2022-10-13 – 2022-10-17 (×9): 400 mg via ORAL
  Filled 2022-10-13 (×9): qty 1

## 2022-10-13 MED ORDER — MAGNESIUM SULFATE 4 GM/100ML IV SOLN
4.0000 g | Freq: Once | INTRAVENOUS | Status: AC
  Administered 2022-10-13: 4 g via INTRAVENOUS
  Filled 2022-10-13: qty 100

## 2022-10-13 MED ORDER — AMLODIPINE BESYLATE 10 MG PO TABS
10.0000 mg | ORAL_TABLET | Freq: Every day | ORAL | Status: DC
  Administered 2022-10-14 – 2022-10-17 (×4): 10 mg via ORAL
  Filled 2022-10-13 (×4): qty 1

## 2022-10-13 MED ORDER — OXYCODONE HCL 5 MG PO TABS
5.0000 mg | ORAL_TABLET | ORAL | Status: DC
  Administered 2022-10-13 – 2022-10-16 (×16): 5 mg via ORAL
  Filled 2022-10-13 (×18): qty 1

## 2022-10-13 MED ORDER — SENNOSIDES-DOCUSATE SODIUM 8.6-50 MG PO TABS
1.0000 | ORAL_TABLET | Freq: Two times a day (BID) | ORAL | Status: DC
  Administered 2022-10-13 – 2022-10-17 (×9): 1 via ORAL
  Filled 2022-10-13 (×9): qty 1

## 2022-10-13 MED ORDER — MAGNESIUM SULFATE 2 GM/50ML IV SOLN: 2.0000 g | Freq: Once | INTRAVENOUS | Status: DC

## 2022-10-13 MED ORDER — HYDROCODONE-ACETAMINOPHEN 5-325 MG PO TABS
1.0000 | ORAL_TABLET | Freq: Four times a day (QID) | ORAL | Status: DC | PRN
  Administered 2022-10-16 – 2022-10-17 (×2): 1 via ORAL
  Filled 2022-10-13 (×2): qty 1

## 2022-10-13 MED ORDER — PANTOPRAZOLE SODIUM 40 MG IV SOLR
40.0000 mg | INTRAVENOUS | Status: DC
  Administered 2022-10-13 – 2022-10-16 (×4): 40 mg via INTRAVENOUS
  Filled 2022-10-13 (×4): qty 10

## 2022-10-13 MED ORDER — ENSURE ENLIVE PO LIQD
237.0000 mL | Freq: Two times a day (BID) | ORAL | Status: DC
  Administered 2022-10-14 – 2022-10-17 (×8): 237 mL via ORAL

## 2022-10-13 MED ORDER — POTASSIUM CHLORIDE CRYS ER 20 MEQ PO TBCR
20.0000 meq | EXTENDED_RELEASE_TABLET | Freq: Every day | ORAL | Status: DC
  Administered 2022-10-13 – 2022-10-14 (×2): 20 meq via ORAL
  Filled 2022-10-13 (×2): qty 1

## 2022-10-13 MED ORDER — MAGNESIUM GLUCONATE 500 MG PO TABS
500.0000 mg | ORAL_TABLET | Freq: Every day | ORAL | Status: DC
  Filled 2022-10-13: qty 1

## 2022-10-13 MED ORDER — LEVOTHYROXINE SODIUM 75 MCG PO TABS
75.0000 ug | ORAL_TABLET | Freq: Every day | ORAL | Status: DC
  Administered 2022-10-14 – 2022-10-17 (×4): 75 ug via ORAL
  Filled 2022-10-13 (×4): qty 1

## 2022-10-13 MED ORDER — METOPROLOL TARTRATE 100 MG PO TABS
100.0000 mg | ORAL_TABLET | Freq: Two times a day (BID) | ORAL | Status: DC
  Administered 2022-10-13 – 2022-10-17 (×8): 100 mg via ORAL
  Filled 2022-10-13 (×8): qty 1

## 2022-10-13 MED ORDER — JEVITY 1.5 CAL/FIBER PO LIQD
1000.0000 mL | ORAL | Status: DC
  Administered 2022-10-14 – 2022-10-16 (×3): 1000 mL
  Filled 2022-10-13 (×4): qty 1000

## 2022-10-13 MED ORDER — ATORVASTATIN CALCIUM 80 MG PO TABS
80.0000 mg | ORAL_TABLET | Freq: Every day | ORAL | Status: DC
  Administered 2022-10-13 – 2022-10-16 (×4): 80 mg via ORAL
  Filled 2022-10-13 (×4): qty 1

## 2022-10-13 MED ORDER — THIAMINE MONONITRATE 100 MG PO TABS
100.0000 mg | ORAL_TABLET | Freq: Every day | ORAL | Status: DC
  Administered 2022-10-13 – 2022-10-17 (×5): 100 mg via ORAL
  Filled 2022-10-13 (×5): qty 1

## 2022-10-13 MED ORDER — ALUM & MAG HYDROXIDE-SIMETH 200-200-20 MG/5ML PO SUSP
30.0000 mL | Freq: Four times a day (QID) | ORAL | Status: DC | PRN
  Administered 2022-10-13: 30 mL via ORAL
  Filled 2022-10-13: qty 30

## 2022-10-13 MED ORDER — POLYETHYLENE GLYCOL 3350 17 G PO PACK
34.0000 g | PACK | Freq: Once | ORAL | Status: AC
  Administered 2022-10-13: 34 g
  Filled 2022-10-13: qty 2

## 2022-10-13 NOTE — Progress Notes (Incomplete)
Echocardiogram 2D Echocardiogram has been performed.  Shane Chambers 10/13/2022, 10:47 AM

## 2022-10-13 NOTE — Care Management Important Message (Signed)
Important Message  Patient Details  Name: Shane Chambers MRN: [id number] Date of Birth: 1961/08/01   Medicare Important Message Given:  Yes     Orbie Pyo 10/13/2022, 2:58 PM

## 2022-10-13 NOTE — Progress Notes (Signed)
[facility] Progress Note  Patient: Shane Chambers     MCN:[id number]  DOA: 10/02/2022   PCP: Caprice Renshaw, MD       Brief hospital course: This is a 61 year old male with diabetes mellitus, coronary artery disease status post CABG, COPD, hypertension, coronary artery disease, peripheral vascular disease who presents to the hospital with right leg pain and swelling. The patient was hospitalized from 11/16 through 11/22 for cellulitis of the right leg, treated with ceftriaxone and vancomycin and discharged with doxycycline and cefadroxil. Blood cultures grew Bacillus species which was felt to be a contaminant.  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  Patient hospital course was complicated by significant encephalopathy and altered mental status.   Subjective:   No significant events overnight, he reports constipation.   Assessment and Plan: Principal Problem: Cellulitis-septic arthritis/osteomyelitis - WBC noted to be 18.6 - Imaging with CT scan and MRI suggestive of right ankle septic arthritis with osteomyelitis.  - 11/26- s/p AKA -treated with vancomycin and cefepime.  -Further management per Dr. Sharol Given.  Fever/PNA - patient with temperature 100.4 11/30, chest x-ray significant for right upper lung opacity.  Was treated with IV Rocephin and Flagyl for aspiration pneumonia, -IV Flagyl discontinued given torsades.  Delirium Acute metabolic encephalopathy -Certainly with some underlying vascular dementia, as has been living at nursing home for last 2 years, with evidence of significant trophy and CVA on CT head obtained yesterday. -Patient is screaming/yelling intermittently, unable to answer any commands or follow any questions . -Hospital delirium contributing, will keep on as needed Haldol. -Minimize narcotics and sedative medications -Patient mentation much improved, tolerating p.o. diet  Hypernatremia -Resolved, will DC D5W,  and decrease his free water via tube.   AKI with metabolic acidosis - Baseline creatinine is about 0.9-1.4 - Creatinine has been steadily rising peaked at 3.06 -Continue to hold losartan and torsemide - have asked for nephrology consult - renal ultrasound unremarkable - Cr is now improving  Acute blood loss anemia-anemia of chronic disease -Hemoglobin 7.7 today - transfused 1 U PRBC on 11/25 and again on 11/27 - anemia panel was obtained on 11/20-ferritin was 913 - iron saturation was low as was iron binding - IV Ferric gluconate given on 11/27 - cont to follow Hgb- has bloody drainage in wound vac and may need further transfusions    Type 2 diabetes mellitus ([place]) -Home medications include 70/30 and Glucophage -NovoLog sliding scale ordered -Hemoglobin A1C    Component Value Date/Time   HGBA1C 5.6 09/26/2022 0404   Hypothyroidism - Continue levothyroxine  Chronic diastolic heart failure/hypertension - Last echo from 2021 revealed grade 2 diastolic dysfunction with an EF of 55 to 60% - Diuretics currently on hold due to AKI -Continue metoprolol  History of coronary artery disease status post CABG - Continue aspirin and Lipitor  Hypertension -He is taking oral medications, will change clonidine patch to p.o., will uptitrate his metoprolol further given torsades.  Continue with as needed metoprolol and hydralazine.    Torsades de pointes QTc -Cardiology input greatly appreciated, avoid prolonging agents, keep potassium>, magnesium> 2. -Will uptitrate metoprolol to 100 mg p.o. twice daily.  BPH - Continue Flomax  Hypokalemia - repleted, recheck in am.     Code Status: Full Code Consultants: Orthopedic surgery Level of Care: Level of care: Telemetry Medical Family communication: Discussed with mother at bedside 11/30, 12/2 DVT prophylaxis:  heparin injection 5,000 Units Start: 10/03/22 0600 SCDs Start: 10/03/22 0127  Objective:   Vitals:   10/13/22 0400  10/13/22 0807 10/13/22 0944 10/13/22 1128  BP: (!) 149/66 (!) 150/74  125/81  Pulse: 67 67 67   Resp: '20 18 20   '$ Temp: 98.4 F (36.9 C) 98.2 F (36.8 C)  98 F (36.7 C)  TempSrc: Oral Oral  Oral  SpO2: 94%  100%   Weight:      Height:       Filed Weights   10/05/22 1026  Weight: 118 kg   Exam:  Awake alert oriented x 3, he is very appropriate and coherent today, frail, chronically ill-appearing Awake Alert, Oriented X 3, No new F.N deficits, Normal affectSymmetrical Chest wall movement, Good air movement bilaterally, CTAB RRR,No Gallops,Rubs or new Murmurs, No Parasternal Heave +ve B.Sounds, Abd Soft, No tenderness, No rebound - guarding or rigidity. No Cyanosis, Clubbing or edema, right BKA   CBC: Recent Labs  Lab 10/10/22 0456 10/11/22 0153 10/12/22 0030 10/12/22 0155 10/13/22 0428  WBC 13.9* 16.4* 14.4* 15.5* 14.3*  NEUTROABS  --   --  10.0*  --   --   HGB 9.2* 8.9* 9.1* 8.4* 8.2*  HCT 28.6* 28.5* 29.5* 28.5* 26.9*  MCV 89.1 91.1 92.8 94.7 94.7  PLT 667* 655* 489* 544* 381   Basic Metabolic Panel: Recent Labs  Lab 10/07/22 0551 10/08/22 0744 10/10/22 0456 10/10/22 1522 10/11/22 0153 10/11/22 1530 10/12/22 0030 10/12/22 0155 10/13/22 0428  NA 138   < > 152*   < > 151* 149* 147* 147* 139  K 4.8   < > 3.3*   < > 3.5 3.7 3.8 3.7 3.8  CL 109   < > 116*   < > 122* 122* 119* 117* 109  CO2 20*   < > 20*   < > 21* 19* 20* 22 23  GLUCOSE 89   < > 191*   < > 199* 171* 170* 152* 168*  BUN 60*   < > 29*   < > 31* 28* 28* 27* 29*  CREATININE 2.62*   < > 1.37*   < > 1.48* 1.27* 1.26* 1.31* 1.15  CALCIUM 9.0   < > 9.5   < > 9.2 8.5* 8.5* 8.9 8.2*  MG  --   --  1.7  --  1.8  --  1.7 1.7 1.6*  PHOS 5.1*  --  2.5  --  2.6  --   --  3.5 3.1   < > = values in this interval not displayed.   GFR: Estimated Creatinine Clearance: 87.7 mL/min (by C-G formula based on SCr of 1.15 mg/dL).  Scheduled Meds:  [START ON 10/14/2022] amLODipine  10 mg Oral Daily   arformoterol   15 mcg Nebulization BID   aspirin EC  81 mg Oral Q breakfast   atorvastatin  80 mg Oral QHS   budesonide (PULMICORT) nebulizer solution  0.25 mg Nebulization BID   cloNIDine  0.1 mg Oral BID   feeding supplement (PROSource TF20)  60 mL Per Tube Daily   free water  200 mL Per Tube Q4H   heparin  5,000 Units Subcutaneous Q8H   insulin aspart  0-15 Units Subcutaneous Q4H   ipratropium-albuterol  3 mL Nebulization BID   [START ON 10/14/2022] levothyroxine  75 mcg Oral QAC breakfast   metoprolol tartrate  100 mg Oral BID   oxyCODONE  5 mg Oral Q4H   polyethylene glycol  34 g Per Tube Once   senna-docusate  1 tablet Oral BID  tamsulosin  0.8 mg Oral Daily   thiamine  100 mg Oral Daily   Continuous Infusions:  cefTRIAXone (ROCEPHIN)  IV 2 g (10/13/22 0941)   feeding supplement (JEVITY 1.5 CAL/FIBER) 1,000 mL (10/13/22 0037)   Imaging and lab data was personally reviewed DG CHEST PORT 1 VIEW  Result Date: 10/12/2022 CLINICAL DATA:  [id number] with ventricular arrhythmia. EXAM: PORTABLE CHEST 1 VIEW COMPARISON:  Portable chest 10/09/2022 FINDINGS: 12:30 a.m. A feeding tube has been inserted. The Dobbhoff tip is in the gastric antrum. Median sternotomy sutures and CABG changes. There is mild cardiomegaly and mild central vascular prominence but no overt edema. There are low lung volumes, particularly on the right where there is elevation of the hemidiaphragm to the mid hilar level. There are perihilar atelectatic bands in the lungs. No focal pneumonic process is seen but please note the right lower lung field is obscured by the elevated right diaphragm. No significant pleural effusion is seen. The mediastinum is normally outlined. Thoracic spondylosis and degenerative disc disease. The IMPRESSION: 1. Mild cardiomegaly and mild central vascular prominence but no overt edema. 2. Low lung volumes, particularly on the right where there is elevation of the hemidiaphragm. Perihilar atelectatic bands. 3. No  visible infiltrate with right lower lung field obscured. 4. Feeding tube radiopaque tip in the gastric antrum. Electronically Signed   By: Telford Nab M.D.   On: 10/12/2022 00:48    LOS: 11 days   Author: Phillips Climes MD 10/13/2022 2:05 PM  To contact [facility]>   Check the care team in [facility] and look for the attending/consulting [facility] provider listed  Log into [email or web address] and use [place]'s universal password   Go to> "[facility]"  and find provider  If you still have difficulty reaching the provider, please page the [facility] (Director on Call) for the Hospitalists listed on amion

## 2022-10-13 NOTE — Progress Notes (Signed)
TR[facility] night cross cover note:   I was notified by RN that patient experiencing indigestion today, and that the patient conveys a history of GERD. He is receiving a dose of Maalox, via existing prn order.  Additionally, I have placed an order for Protonix 40 mg IV daily, with first dose now.       Babs Bertin, DO Hospitalist

## 2022-10-13 NOTE — Progress Notes (Signed)
Physical Therapy Treatment Patient Details Name: Shane Chambers MRN: [id number] DOB: 11-Jul-1961 Today's Date: 10/13/2022   History of Present Illness Pt is a 61 yo male admitted after recent hospitalization for cellulitis from 11/16-11/22. Pt sent home with antibiotics but returned with leg pain.  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 CT scan with no acute abnomality.  PMH: DM, CAD, CABG, COPD, HTN, PVD, AKI.    PT Comments    Pt more alert and able to participate in therapy, as compared to previous session. Pt very pleasant and eager to participate. He required max assist supine to sit, max assist lateral scoots EOB, and mod assist sit to supine. He sat EOB x 10 minutes with fair sitting balance. LE exercises in supine and sit. VSS on 3L. Appropriate but mild confusion still present. States his desire is to go to an intensive rehab program instead of returning to SNF.  Pt supine in bed at end of session. Will need maximove for bed to recliner transfers.   Recommendations for follow up therapy are one component of a multi-disciplinary discharge planning process, led by the attending physician.  Recommendations may be updated based on patient status, additional functional criteria and insurance authorization.  Follow Up Recommendations  Skilled nursing-short term rehab (<3 hours/day) Can patient physically be transported by private vehicle: No   Assistance Recommended at Discharge Frequent or constant Supervision/Assistance  Patient can return home with the following Two people to help with walking and/or transfers;Two people to help with bathing/dressing/bathroom;Direct supervision/assist for medications management;Assist for transportation;Direct supervision/assist for financial management;Help with stairs or ramp for entrance   Equipment Recommendations  None recommended by PT    Recommendations for Other Services       Precautions  / Restrictions Precautions Precautions: Fall Required Braces or Orthoses: Other Brace Other Brace: limb protector R residual limb Restrictions Weight Bearing Restrictions: Yes RLE Weight Bearing: Non weight bearing     Mobility  Bed Mobility Overal bed mobility: Needs Assistance Bed Mobility: Rolling, Supine to Sit, Sit to Sidelying Rolling: Mod assist   Supine to sit: Max assist, HOB elevated   Sit to sidelying: Mod assist General bed mobility comments: +rail, cues for sequencing, increased time    Transfers                   General transfer comment: max assist lateral scoots EOB    Ambulation/Gait                   Stairs             Wheelchair Mobility    Modified Rankin (Stroke Patients Only)       Balance Overall balance assessment: Needs assistance Sitting-balance support: Bilateral upper extremity supported, Single extremity supported, Feet supported Sitting balance-Leahy Scale: Fair Sitting balance - Comments: Pt sat EOB x 10 minutes. Initially required min assist but progressed to min guard. Able to perform LE exercises sitting                                    Cognition Arousal/Alertness: Awake/alert Behavior During Therapy: WFL for tasks assessed/performed Overall Cognitive Status: Impaired/Different from baseline Area of Impairment: Orientation, Attention, Memory, Following commands, Safety/judgement, Awareness, Problem solving                 Orientation Level: Disoriented  to, Place Current Attention Level: Selective Memory: Decreased recall of precautions, Decreased short-term memory Following Commands: Follows one step commands consistently Safety/Judgement: Decreased awareness of safety, Decreased awareness of deficits Awareness: Emergent Problem Solving: Slow processing, Difficulty sequencing, Requires verbal cues General Comments: word finding difficulty        Exercises General Exercises -  Lower Extremity Ankle Circles/Pumps: AROM, Left, 10 reps Amputee Exercises Hip ABduction/ADduction: AROM, Both, 5 reps, Supine Knee Flexion: AROM, Right, Left, 10 reps, Seated Knee Extension: AROM, Right, Left, 10 reps, Seated    General Comments General comments (skin integrity, edema, etc.): VSS on 3L      Pertinent Vitals/Pain Pain Assessment Pain Assessment: Faces Faces Pain Scale: Hurts little more Pain Location: R residual limb Pain Descriptors / Indicators: Discomfort, Grimacing Pain Intervention(s): Monitored during session, Repositioned    Home Living                          Prior Function            PT Goals (current goals can now be found in the care plan section) Acute Rehab PT Goals Patient Stated Goal: rehab Progress towards PT goals: Progressing toward goals    Frequency    Min 3X/week      PT Plan Frequency needs to be updated    Co-evaluation              AM-PAC PT "6 Clicks" Mobility   Outcome Measure  Help needed turning from your back to your side while in a flat bed without using bedrails?: A Lot Help needed moving from lying on your back to sitting on the side of a flat bed without using bedrails?: A Lot Help needed moving to and from a bed to a chair (including a wheelchair)?: Total Help needed standing up from a chair using your arms (e.g., wheelchair or bedside chair)?: Total Help needed to walk in hospital room?: Total Help needed climbing 3-5 steps with a railing? : Total 6 Click Score: 8    End of Session Equipment Utilized During Treatment: Oxygen Activity Tolerance: Patient tolerated treatment well Patient left: in bed;with call bell/phone within reach;with bed alarm set Nurse Communication: Mobility status PT Visit Diagnosis: Muscle weakness (generalized) (M62.81);Difficulty in walking, not elsewhere classified (R26.2);Other abnormalities of gait and mobility (R26.89)     Time: 6195-0932 PT Time  Calculation (min) (ACUTE ONLY): 25 min  Charges:  $Therapeutic Exercise: 8-22 mins $Therapeutic Activity: 8-22 mins                     Shane Chambers, PT  Office # [phone] Pager [phone]    Lorriane Shire 10/13/2022, 9:46 AM

## 2022-10-13 NOTE — Progress Notes (Signed)
[facility] Followup Face-to-Face Psychiatric Evaluation  Service Date: October 13, 2022 LOS:  LOS: 11 days   Assessment  Shane Chambers is a 61 y.o. male admitted medically for 10/02/2022  4:53 PM for cellulitis. He carries the psychiatric diagnoses of depression and has a past medical history of rectal cancer, CAD s/p CABG 2021, COPD, T2DM, HLD, ischemic cardiomyopathy, OSA, hypothyroidism, HTN, PVD . AMS, not taking amitriptyline for 3 days by Dr. Waldron Labs.   His current presentation of resolving altered mental status is most consistent with resolving hypoactive delirium in the setting of underlying neurocognitive disorder. He meets criteria for delirium based on the fluctuating awareness that developed over a short period of time and is now resolving. On initial exam, he was not responding to any questions or commands. However, on exam today, he is alert and engaged in discussion. Current outpatient psychotropic medications included elavil '75mg'$ , effexor '225mg'$  daily, gabapentin '600mg'$  BID. Over the weekend, patient had episode of Vtach and Torsades suspected to be secondary to QT prolongation. His antidepressants have been discontinued in this setting. Discussed with cardiology who did not recommend restarting any psychiatric meds that could prolong QT at this time. He could potentially restart in the future if his Qtc normalizes but would need very close follow-up with cardiology. We also discussed with patient the importance of therapy to help with managing his depression. Patient also amenable to seeing Chaplain for additional support with his medical course. Please see plan below for detailed recommendations.   Diagnoses:  Active Hospital problems: Principal Problem:   Cellulitis Active Problems:   Hypertension   Type 2 diabetes mellitus (HCC)   Hyperlipidemia   OSA (obstructive sleep apnea)   GERD (gastroesophageal reflux disease)   AKI (acute kidney injury) ([place])    Subacute osteomyelitis, right ankle and foot (HCC)   Hypothyroidism   Hypoalbuminemia   Septic arthritis of right ankle ([place])    Plan  ## Safety and Observation Level:  - Based on my clinical evaluation, I estimate the patient to be at low risk of self harm in the current setting - At this time, we recommend a routine level of observation. This decision is based on my review of the chart including patient's history and current presentation, interview of the patient, mental status examination, and consideration of suicide risk including evaluating suicidal ideation, plan, intent, suicidal or self-harm behaviors, risk factors, and protective factors. This judgment is based on our ability to directly address suicide risk, implement suicide prevention strategies and develop a safety plan while the patient is in the clinical setting. Please contact our team if there is a concern that risk level has changed.  ## Medications:  -- STOP amitryptiline and effexor given Torsades caused by QT prolonging agents  -- Could potentially consider restarting in the future if his QT normalizes but would need close follow-up with cardiology -- Placed order for chaplain  -- Consider pulmonology referral to treat OSA while inpatient   ## Medical Decision Making Capacity:  -- Not assessed   ## Further Work-up:  Per primary   -- most recent EKG on 12/4 had Qtc of 501 -- Pertinent labwork reviewed earlier this admission includes: CO2 20, pCO2 29 (12/1) -- 11/29 CT head with no acute intracranial abnormality, diffuse parenchymal volume loss that is relatively advanced in relation to patient's age.   ## Disposition:  -- TBD  ## Behavioral / Environmental:  -- Delirium precautions   ##Legal Status -Voluntary  Thank you  for this consult request. Recommendations have been communicated to the primary team.  We will follow at this time.   Rolanda Lundborg, MD   Followup history  Relevant Aspects of Hospital  Course:  Admitted on 10/02/2022 for cellulitis.  12/2 evening: Transient run of Vtach. CODE BLUE due to unresponsiveness with agnoal breathing. Cardiology consulted, noted new onset of QT prolongation which they suspect to be the cause of his torsades and recommended avoiding QT prolonging medications.   Patient Report:  10/10/22: Patient with eyes opened, intermittently tracking. Does not answer questions or speak spontaneously. Per nurse, patient has been occasionally difficult to arouse in the morning. Otherwise he has been alert but non-verbal   10/11/22: Patient seen face to face lying in his hospital bed, his mother at his bedside. Today, he is awake, alert, oriented x 3, calm, cooperative with slow but audible speech. He states that he is making a slow but steady progress as evidence by increase alertness, self motivation and decreased anxiety and depressive symptoms. However, he is still concern by his inability to speak fluently but appreciate his improvement on current medication dosage-Effexor XR 150 mg daily. Overall, patient denies psychosis, delusions, mood swings and self harming thoughts.  10/13/22: Pt aware of current medical status and accurately describes current cardiology workup. He is much more engaged in evaluation than prior eval by this team 12/4. He has ongoing marked short term memory deficits and difficulty answering many timeline questions. Aware of these deficits. He had been seeing [facility] for depression and adjustment disorder. No suicidal thoughts today, homicidal thoughts today. Has been having intermittent hallucinations since he got his surgery done primarily has decided that one of the departments here are haunted. Sounds like this is largely to do with unfamiliar faces around him while hospitalized - seems to have good insight into this not being real. Fairly oriented today - hospital, city, year, month. Thought it was Tuesday but was unsure about this. Discussed with  patient importance of therapy follow-up for his mental health. Discussed with patient having the chaplain come by and talk with him and he was agreeable.   ROS:  As above   Collateral information:  Mom's phone number out of service. Provider saw patient with mother at bedside on 12/2.   Psychiatric History:  Information collected from chart review Seen by [facility], unspecified depressive disorder and adjustment disorder are prior diagnoses No prior psychiatric admissions Home psychiatric meds include amitriptyline 75 QHS, effexor 225 daily.    Family psych history: mother history of mental disorder  Social History:  Per chart review, reports that he quit smoking about 27 years ago. His smoking use included cigarettes. He smoked an average of 1.5 packs per day. He has never used smokeless tobacco. He reports that he does not currently use alcohol. He reports that he does not use drugs.   Family History:  The patient's family history includes Hypertension in his mother.  Medical History: Past Medical History:  Diagnosis Date   Anemia    Arthritis    CAD (coronary artery disease)    a. s/p CABG in 03/2020 with LIMA-LAD, RIMA-PL, RA-[id number]-OM1   Cancer ([place])    rectal   Cellulitis    COPD (chronic obstructive pulmonary disease) ([place])    Depression    Diabetes mellitus without complication (HCC)    GERD (gastroesophageal reflux disease)    History of kidney stones    Hyperlipidemia 12/01/2019   Hypertension    Hypothyroidism  Ischemic cardiomyopathy    a. EF 20-25% by echo in 02/2020 b. at 40% by echo in 03/2020 c. EF normalized to 60-65% by echo in 04/2020   Myocardial infarction [facility])    Peripheral vascular disease ([place])    Sleep apnea    Type 2 diabetes mellitus [facility])     Surgical History: Past Surgical History:  Procedure Laterality Date   AMPUTATION Right 10/05/2022   Procedure: AMPUTATION BELOW KNEE;  Surgeon: Newt Minion, MD;  Location: [place];  Service:  Orthopedics;  Laterality: Right;   APPLICATION OF WOUND VAC Right 10/05/2022   Procedure: APPLICATION OF WOUND VAC;  Surgeon: Newt Minion, MD;  Location: [place];  Service: Orthopedics;  Laterality: Right;   BIOPSY  07/18/2021   Procedure: BIOPSY;  Surgeon: Eloise Harman, DO;  [facility];  Service: Endoscopy;;   BIOPSY  01/09/2022   Procedure: BIOPSY;  Surgeon: Eloise Harman, DO;  [facility];  Service: Endoscopy;;   COLONOSCOPY WITH PROPOFOL N/A 07/18/2021   Carver: 15 millimeter polyp removed from the sigmoid colon, 5 mm polyp removed from sigmoid colon.  Nonbleeding internal hemorrhoids.  Significant looping of the colon. sigmoid path showed invasive colonic adenocarcinoma involving tubular adenoma (invades to depth of 68m, carcinoma 157mfrom margin, no lymphovascular invasion, no poorly differentiated component.   CORONARY ARTERY BYPASS GRAFT N/A 03/13/2020   Procedure: CORONARY ARTERY BYPASS GRAFTING (CABG) times five using bilateral Internal mammary arteries and left radial artery;  Surgeon: AtWonda OldsMD;  [place] Service: Open Heart Surgery;  Laterality: N/A;   DENTAL SURGERY     ESOPHAGOGASTRODUODENOSCOPY (EGD) WITH PROPOFOL N/A 07/18/2021   Carver: 1 gastric polyp status post biopsy, gastritis. gastric bx with slight chronic inflammation and no H.pyori. GEJ polypectomy with mild inflammation only   FLEXIBLE SIGMOIDOSCOPY N/A 08/26/2021   Carver: Nonbleeding internal hemorrhoids.  15 mm ulcers from previous polypectomy found in the rectum.  No evidence of previous polyp.  Located 5 to 8 cm from anal verge.   FLEXIBLE SIGMOIDOSCOPY N/A 01/09/2022   Procedure: FLEXIBLE SIGMOIDOSCOPY;  Surgeon: CaEloise HarmanDO;  [facility];  Service: Endoscopy;  Laterality: N/A;   POLYPECTOMY  07/18/2021   Procedure: POLYPECTOMY INTESTINAL;  Surgeon: CaEloise HarmanDO;  [facility];  Service: Endoscopy;;   RADIAL ARTERY  HARVEST Left 03/13/2020   Procedure: [place]  Surgeon: AtWonda OldsMD;  [place] Service: [place] Surgery;  Laterality: Left;   RIGHT/LEFT HEART CATH AND CORONARY ANGIOGRAPHY N/A 03/07/2020   Procedure: RIGHT/LEFT HEART CATH AND CORONARY ANGIOGRAPHY;  Surgeon: CoSherren MochaMD;  [place] [facility];  Service: Cardiovascular;  Laterality: N/A;   SUBMUCOSAL LIFTING INJECTION  01/09/2022   Procedure: SUBMUCOSAL LIFTING INJECTION;  Surgeon: CaEloise HarmanDO;  [facility];  Service: Endoscopy;;   SUBMUCOSAL TATTOO INJECTION  01/09/2022   Procedure: SUBMUCOSAL TATTOO INJECTION;  Surgeon: CaEloise HarmanDO;  [facility];  Service: Endoscopy;;   TEE WITHOUT CARDIOVERSION N/A 03/13/2020   Procedure: TRANSESOPHAGEAL ECHOCARDIOGRAM (TEE);  Surgeon: AtWonda OldsMD;  [place] Service: [place];  Laterality: N/A;    Medications:   Current Facility-Administered Medications:    [START ON 10/14/2022] amLODipine (NORVASC) tablet 10 mg, 10 mg, Oral, Daily, Elgergawy, DaSilver HugueninMD   arformoterol (BROVANA) nebulizer solution 15 mcg, 15 mcg, Nebulization, BID, Elgergawy, DaEmeline Gins  S, MD, 15 mcg at 10/13/22 8546   aspirin EC tablet 81 mg, 81 mg, Oral, Q breakfast, Zierle-Ghosh, Asia B, DO, 81 mg at 10/13/22 0945   atorvastatin (LIPITOR) tablet 80 mg, 80 mg, Oral, QHS, Elgergawy, Silver Huguenin, MD   budesonide (PULMICORT) nebulizer solution 0.25 mg, 0.25 mg, Nebulization, BID, Elgergawy, Silver Huguenin, MD, 0.25 mg at 10/13/22 0837   cefTRIAXone (ROCEPHIN) 2 g in sodium chloride 0.9 % 100 mL IVPB, 2 g, Intravenous, Q24H, Elgergawy, Silver Huguenin, MD, Last Rate: 200 mL/hr at 10/13/22 0941, 2 g at 10/13/22 0941   cloNIDine (CATAPRES) tablet 0.1 mg, 0.1 mg, Oral, BID, Elgergawy, Silver Huguenin, MD, 0.1 mg at 10/13/22 0945   [START ON 10/14/2022] feeding supplement (ENSURE ENLIVE / ENSURE PLUS) liquid 237 mL, 237 mL, Oral, BID BM, Elgergawy, Silver Huguenin, MD   [START ON 10/14/2022] feeding supplement (JEVITY 1.5 CAL/FIBER) liquid 1,000 mL, 1,000 mL, Per Tube, Q24H, Elgergawy, Silver Huguenin, MD   feeding supplement (PROSource TF20) liquid 60 mL, 60 mL, Per Tube, Daily, Elgergawy, Silver Huguenin, MD, 60 mL at 10/13/22 0945   free water 200 mL, 200 mL, Per Tube, Q4H, Elgergawy, Silver Huguenin, MD, 200 mL at 10/13/22 1145   heparin injection 5,000 Units, 5,000 Units, Subcutaneous, Q8H, Zierle-Ghosh, Asia B, DO, 5,000 Units at 10/13/22 0507   HYDROcodone-acetaminophen (NORCO/VICODIN) 5-325 MG per tablet 1 tablet, 1 tablet, Oral, Q6H PRN, Elgergawy, Silver Huguenin, MD   insulin aspart (novoLOG) injection 0-15 Units, 0-15 Units, Subcutaneous, Q4H, Elgergawy, Silver Huguenin, MD, 3 Units at 10/13/22 0945   ipratropium-albuterol (DUONEB) 0.5-2.5 (3) MG/3ML nebulizer solution 3 mL, 3 mL, Nebulization, BID, Elgergawy, Silver Huguenin, MD, 3 mL at 10/13/22 0837   labetalol (NORMODYNE) injection 10 mg, 10 mg, Intravenous, Q4H PRN, Howerter, Justin B, DO, 10 mg at 10/11/22 0458   [START ON 10/14/2022] levothyroxine (SYNTHROID) tablet 75 mcg, 75 mcg, Oral, QAC breakfast, Elgergawy, Silver Huguenin, MD   magnesium oxide (MAG-OX) tablet 400 mg, 400 mg, Oral, BID, Elgergawy, Silver Huguenin, MD   metoprolol tartrate (LOPRESSOR) tablet 100 mg, 100 mg, Oral, BID, Elgergawy, Silver Huguenin, MD   oxyCODONE (Oxy IR/ROXICODONE) immediate release tablet 5 mg, 5 mg, Oral, Q4H, Elgergawy, Silver Huguenin, MD, 5 mg at 10/13/22 1146   polyethylene glycol (MIRALAX / GLYCOLAX) packet 17 g, 17 g, Oral, Daily PRN, Zierle-Ghosh, Asia B, DO   polyethylene glycol (MIRALAX / GLYCOLAX) packet 34 g, 34 g, Per Tube, Once, Elgergawy, Dawood S, MD   potassium chloride SA (KLOR-CON M) CR tablet 20 mEq, 20 mEq, Oral, Daily, Elgergawy, Silver Huguenin, MD   senna-docusate (Senokot-S) tablet 1 tablet, 1 tablet, Oral, BID, Elgergawy, Silver Huguenin, MD   tamsulosin (FLOMAX) capsule 0.8 mg, 0.8 mg, Oral, Daily, Zierle-Ghosh, Asia B, DO, 0.8 mg at 10/13/22 0945   thiamine  (VITAMIN B1) tablet 100 mg, 100 mg, Oral, Daily, Elgergawy, Silver Huguenin, MD, 100 mg at 10/13/22 1146  Allergies: Allergies  Allergen Reactions   Ace Inhibitors Swelling and Cough    (Not on MAR at [facility])   Other Itching    [name]   Shellfish Allergy    Penicillins Itching and Rash    Has patient had a PCN reaction causing immediate rash, facial/tongue/throat swelling, SOB or lightheadedness with hypotension: Unknown Has patient had a PCN reaction causing severe rash involving mucus membranes or skin necrosis: Unknown Has patient had a PCN reaction that required hospitalization: Unknown Has patient had a PCN reaction occurring within  the last 10 years: Unknown If all of the above answers are "NO", then may proceed with Cephalosporin use.      Objective  Vital signs:  Temp:  [97.8 F (36.6 C)-98.4 F (36.9 C)] 98 F (36.7 C) (12/04 1128) Pulse Rate:  [60-68] 67 (12/04 0944) Resp:  [11-20] 20 (12/04 0944) BP: (125-150)/(63-81) 125/81 (12/04 1128) SpO2:  [94 %-100 %] 100 % (12/04 0944)  Mental Status Exam   Appearance and Grooming: Patient is laying in bed in scrubs. The patient has no noticeable scent or odor.  Motor activity: The patient's movement speed was normal; his gait was not observed during encounter. There was no notable abnormal facial movements and no notable abnormal extremity movements. Behavior: The patient appears in no acute distress, and during the interview, was calm, focused, required minimal redirection, and behaving appropriately to scenario; he was able to follow commands and compliant to requests and made good eye contact. The patient did not appear internally or externally preoccupied. Attitude: Patient's attitude towards the interviewer was cooperative and open. Speech: The patient's speech was clear, fluent, with good articulation, and with appropriately placed inflections. The volume of his speech was normal and  normal in quantity. The rate was normal with a normal rhythm. Responses were normal in latency. There were no abnormal patterns in speech. Mood: "Okay" Affect: Patient's affect is euthymic with broad range and even fluctuations; his affect is congruent with his stated mood. ------------------------------------------------------------------------------------------------------------------------- Thought Content The patient experiences no hallucinations currently. Reported intermittent hallucinations of seeing unfamiliar faces while in the hospital. The patient describes no delusional thoughts; he denies thought insertion, denies thought withdrawal, denies thought interruption, and denies thought broadcasting. Patient at the time of interview denies active suicidal intent and denies passive suicidal ideation; he denies homicidal intent. Thought Process The patient's thought process is linear and is goal-directed. Insight The patient at the time of interview demonstrates fair insight Judgement The patient over the past 24 hours demonstrates fair judgement  Memory: Limited  Executive Functions  Concentration: Intact Attention Span: Fair Recall: Poor Fund of Knowledge: Fair  Alertness/Orientation: Alert and oriented to hospital, city, year, and month. Not to time of month.   Physical Exam Constitutional:      Appearance: the patient is not toxic-appearing.  Pulmonary:     Effort: Pulmonary effort is normal.  Neurological:     General: No focal deficit present.     Mental Status: the patient is alert and oriented to person, place, year, and month   Review of Systems  Respiratory:  Negative for shortness of breath.   Cardiovascular:  Negative for chest pain.  Gastrointestinal:  Negative for abdominal pain.   Blood pressure 125/81, pulse 67, temperature 98 F (36.7 C), temperature source Oral, resp. rate 20, height 5' 9.02" (1.753 m), weight 118 kg, SpO2 100 %. Body mass index is 38.4  kg/m.

## 2022-10-13 NOTE — Progress Notes (Signed)
Rounding Note    Patient Name: Shane Chambers Date of Encounter: 10/13/2022  [place] Cardiologist: Carlyle Dolly, MD   Subjective   He states he is doing well, no chest pain, no appetite to eat.   Inpatient Medications    Scheduled Meds:  amLODipine  10 mg Per Tube Daily   arformoterol  15 mcg Nebulization BID   aspirin EC  81 mg Oral Q breakfast   atorvastatin  80 mg Per Tube QHS   budesonide (PULMICORT) nebulizer solution  0.25 mg Nebulization BID   cloNIDine  0.1 mg Oral BID   feeding supplement (PROSource TF20)  60 mL Per Tube Daily   free water  200 mL Per Tube Q4H   heparin  5,000 Units Subcutaneous Q8H   insulin aspart  0-15 Units Subcutaneous Q4H   ipratropium-albuterol  3 mL Nebulization BID   levothyroxine  75 mcg Per Tube QAC breakfast   metoprolol tartrate  100 mg Per Tube BID   oxyCODONE  5 mg Per Tube Q4H   potassium chloride  40 mEq Per Tube Once   tamsulosin  0.8 mg Oral Daily   Continuous Infusions:  cefTRIAXone (ROCEPHIN)  IV 2 g (10/12/22 1005)   feeding supplement (JEVITY 1.5 CAL/FIBER) 1,000 mL (10/13/22 0037)   magnesium sulfate bolus IVPB     thiamine (VITAMIN B1) injection 500 mg (10/12/22 1704)   PRN Meds: HYDROcodone-acetaminophen, labetalol, polyethylene glycol   Vital Signs    Vitals:   10/12/22 2002 10/13/22 0044 10/13/22 0400 10/13/22 0807  BP: 131/68 134/63 (!) 149/66 (!) 150/74  Pulse: 68 60 67   Resp: '11 12 20   '$ Temp: 97.8 F (36.6 C) 98 F (36.7 C) 98.4 F (36.9 C) 98.2 F (36.8 C)  TempSrc: Oral Oral Oral Oral  SpO2: 96% 96% 94%   Weight:      Height:        Intake/Output Summary (Last 24 hours) at 10/13/2022 0851 Last data filed at 10/13/2022 0413 Gross per 24 hour  Intake --  Output 900 ml  Net -900 ml      10/05/2022   10:26 AM 10/01/2022    5:13 AM 09/30/2022    5:08 AM  Last 3 Weights  Weight (lbs) 260 lb 2.3 oz 260 lb 2.3 oz 258 lb 2.5 oz  Weight (kg) 118 kg 118 kg 117.1 kg       Telemetry    Sinus rhythm, no further episodes of Torsades - Personally Reviewed  ECG    EKG today showed sinus rhythm 66 bpm,manual Qtc is 461 msec, non-sepcific T wave abnormalities - Personally Reviewed  Physical Exam   GEN: No acute distress.  Chronic ill appearing  Neck: No JVD Cardiac: RRR, no murmurs, rubs, or gallops.  Respiratory: Clear to auscultation bilaterally. On [place] oxygen  GI: Soft, nontender, Dobhoff in place  MS: Trace LLE edema; Right BKA  Neuro:  Alert and oriented x3 , no focal deficit  Psych: Normal affect   Labs    High Sensitivity Troponin:   Recent Labs  Lab 10/13/22 0428  TROPONINIHS 49*     Chemistry Recent Labs  Lab 10/07/22 0551 10/08/22 0744 10/12/22 0030 10/12/22 0155 10/13/22 0428  NA 138   < > 147* 147* 139  K 4.8   < > 3.8 3.7 3.8  CL 109   < > 119* 117* 109  CO2 20*   < > 20* 22 23  GLUCOSE 89   < >  170* 152* 168*  BUN 60*   < > 28* 27* 29*  CREATININE 2.62*   < > 1.26* 1.31* 1.15  CALCIUM 9.0   < > 8.5* 8.9 8.2*  MG  --    < > 1.7 1.7 1.6*  PROT  --   --  6.6  --   --   ALBUMIN 2.1*  --  2.1*  --   --   AST  --   --  25  --   --   ALT  --   --  19  --   --   ALKPHOS  --   --  110  --   --   BILITOT  --   --  <0.1*  --   --   GFRNONAA 27*   < > >60 >60 >60  ANIONGAP 9   < > '8 8 7   '$ < > = values in this interval not displayed.    Lipids No results for input(s): "CHOL", "TRIG", "HDL", "LABVLDL", "LDLCALC", "CHOLHDL" in the last 168 hours.  Hematology Recent Labs  Lab 10/12/22 0030 10/12/22 0155 10/13/22 0428  WBC 14.4* 15.5* 14.3*  RBC 3.18* 3.01* 2.84*  HGB 9.1* 8.4* 8.2*  HCT 29.5* 28.5* 26.9*  MCV 92.8 94.7 94.7  MCH 28.6 27.9 28.9  MCHC 30.8 29.5* 30.5  RDW 15.4 15.5 15.2  PLT 489* 544* 361   Thyroid No results for input(s): "TSH", "FREET4" in the last 168 hours.  BNPNo results for input(s): "BNP", "PROBNP" in the last 168 hours.  DDimer No results for input(s): "DDIMER" in the last 168 hours.    Radiology    DG CHEST PORT 1 VIEW  Result Date: 10/12/2022 CLINICAL DATA:  02637 with ventricular arrhythmia. EXAM: PORTABLE CHEST 1 VIEW COMPARISON:  Portable chest 10/09/2022 FINDINGS: 12:30 a.m. A feeding tube has been inserted. The Dobbhoff tip is in the gastric antrum. Median sternotomy sutures and CABG changes. There is mild cardiomegaly and mild central vascular prominence but no overt edema. There are low lung volumes, particularly on the right where there is elevation of the hemidiaphragm to the mid hilar level. There are perihilar atelectatic bands in the lungs. No focal pneumonic process is seen but please note the right lower lung field is obscured by the elevated right diaphragm. No significant pleural effusion is seen. The mediastinum is normally outlined. Thoracic spondylosis and degenerative disc disease. The IMPRESSION: 1. Mild cardiomegaly and mild central vascular prominence but no overt edema. 2. Low lung volumes, particularly on the right where there is elevation of the hemidiaphragm. Perihilar atelectatic bands. 3. No visible infiltrate with right lower lung field obscured. 4. Feeding tube radiopaque tip in the gastric antrum. Electronically Signed   By: Telford Nab M.D.   On: 10/12/2022 00:48    Cardiac Studies   Echo is pending today   Echo from 08/03/20:   1. Left ventricular ejection fraction, by estimation, is 55 to 60%. The  left ventricle has normal function. The left ventricle has no regional  wall motion abnormalities. There is mild left ventricular hypertrophy.  Left ventricular diastolic parameters  are consistent with Grade II diastolic dysfunction (pseudonormalization).  Elevated left atrial pressure.   2. There is moderate calcification of the aortic valve. There is moderate  thickening of the aortic valve.   3. Limited echo to evaluate LV function    Patient Profile   61 y.o. male with a hx of CAD s/p CABG 03/2020 with( LIMA-LAD, RIMA-PL,  RA-D1-RI-OM1), ischemic cardiomyopathy with recovered LVEF, chronic diastolic heart failure, COPD, depression,  hyperlipidemia, recurrent cellulitis,  cardiology is following since 10/12/22 for the evaluation of Torsades.   Assessment & Plan    Torsades de pointes QT prolongation  - ECG serial reviewed from 10/05/22 to 10/11/22, noted new onset of QT prolongation - QT prolongation likely is the cause of torsades, patient has been receiving QT prolonging agents including amitriptyline, venlafaxine, metronidazole, Haldol, and Zofran per MAR review since 10/03/22  - Please avoid QT prolonging medication such as psychotropic drugs (SSRI, TCAs, SNRI), haldol, fluoroquinolone, Flagyl, Zofran, antihistamines,etc - Please maintain electrolytes to keep K above 4 and magnesium above 2 - Echocardiogram has been ordered by primary team, will follow - Continue metoprolol, OK up-titrate per primary team  - Repeat EKG showed resolved QT prolongation    CAD with history of CABG 2021 -Has been doing well post CABG anginal symptoms -Continue medical therapy with aspirin 81, Lipitor 80, metoprolol  -Will follow-up on echocardiogram - Noted Hs trop 49 x1 ordered per primary team, no angina, will stop trend    Ischemic cardiomyopathy with recovered LVEF Chronic diastolic heart failure -Last echo from 07/30/2020 with LVEF recovered to 55 to 60%, no regional wall motion abnormality, grade 2 diastolic dysfunction -Clinically euvolemic -Chest x-ray 10/12/22 revealed no overt pulmonary edema -May diuresis as needed with as needed torsemide   Sepsis secondary to leg cellulitis/septic arthritis/osteomyelitis Pneumonia, healthcare associated Acute metabolic encephalopathy with hypoactive delirium, resolved  AKI, resolved  Hypernatremia, resolved  Anemia Type 2 diabetes Hypothyroidism BPH - per IM     For questions or updates, please contact [place] Please consult [email or web address] for contact info  under        Signed, Margie Billet, NP  10/13/2022, 8:51 AM

## 2022-10-13 NOTE — Progress Notes (Signed)
Nutrition Follow-up  DOCUMENTATION CODES:   Not applicable  INTERVENTION:   Tube feeds via Cortrak: Jevity 1.5 at 83 mL x 12 hours  60 mL ProSource TF20 - Daily 200 mL free water flush q4h per MD Provides 1580 kcal, 84 gm protein, and 1960 mL total free water daily. Ensure Enlive po BID, each supplement provides 350 kcal and 20 grams of protein. Meal ordering with assist  NUTRITION DIAGNOSIS:   Increased nutrient needs related to wound healing as evidenced by estimated needs.  GOAL:   Patient will meet greater than or equal to 90% of their needs  MONITOR:   PO intake, Supplement acceptance, Labs, TF tolerance  REASON FOR ASSESSMENT:   Consult Enteral/tube feeding initiation and management  ASSESSMENT:   61 y.o. male presented to the ED with worsening cellulitis. PMH includes COPD, CAD, T2DM, GERD, HTN, PAD, anemia, hypothyroidism, and CHF. Pt admitted with cellulitis, osteomyelitis, and AKI.   11/30 - R BKA, wound VAC placed 12/01 - Cortrak placed 12/02 - diet downgraded to Dysphagia 3   Pt sleeping at time of RD visit, woke to RD voice. Pt reports that he does not have much of an appetite. States that he would rather drink something versus eat something. Agreeable to Ensure shake during the day and nocturnal feeds at night. States that he use to drink Glucerna a long time ago. RD provided pt with a menu and reviewed the menu with him.  No meal intakes have been recorded.   Reached out to MD about able to advance diet to help with texture modification.   Medications reviewed and include: NovoLog SSI, Magnesium Oxide, Miralax, Potassium Chloride, Senokot-S, Thiamine, IV antibiotics  Labs reviewed: BUN 29, Magnesium 1.6, 24 hr CBGs 143-230  Diet Order:   Diet Order             Diet regular Room service appropriate? Yes; Fluid consistency: Thin  Diet effective now                   EDUCATION NEEDS:   No education needs have been identified at this  time  Skin:  Skin Assessment: Skin Integrity Issues: Skin Integrity Issues:: Stage I, Incisions Stage I: Buttocks Incisions: R Leg  Last BM:  11/22  Height:   Ht Readings from Last 1 Encounters:  10/05/22 5' 9.02" (1.753 m)    Weight:   Wt Readings from Last 1 Encounters:  10/05/22 118 kg    Ideal Body Weight:  52.7 kg  BMI:  Body mass index is 38.4 kg/m.  Estimated Nutritional Needs:  Kcal:  2200-2400 Protein:  110-130 grams Fluid:  >/= 2 L   Hermina Barters RD, LDN Clinical Dietitian See [facility] for contact information.

## 2022-10-14 ENCOUNTER — Other Ambulatory Visit: Payer: Self-pay | Admitting: Physician Assistant

## 2022-10-14 DIAGNOSIS — N179 Acute kidney failure, unspecified: Secondary | ICD-10-CM | POA: Diagnosis not present

## 2022-10-14 DIAGNOSIS — R9431 Abnormal electrocardiogram [ECG] [EKG]: Secondary | ICD-10-CM

## 2022-10-14 DIAGNOSIS — F05 Delirium due to known physiological condition: Secondary | ICD-10-CM

## 2022-10-14 DIAGNOSIS — E039 Hypothyroidism, unspecified: Secondary | ICD-10-CM | POA: Diagnosis not present

## 2022-10-14 DIAGNOSIS — M86271 Subacute osteomyelitis, right ankle and foot: Secondary | ICD-10-CM | POA: Diagnosis not present

## 2022-10-14 LAB — BASIC METABOLIC PANEL
Anion gap: 8 (ref 5–15)
BUN: 29 mg/dL — ABNORMAL HIGH (ref 6–20)
CO2: 18 mmol/L — ABNORMAL LOW (ref 22–32)
Calcium: 8.5 mg/dL — ABNORMAL LOW (ref 8.9–10.3)
Chloride: 112 mmol/L — ABNORMAL HIGH (ref 98–111)
Creatinine, Ser: 1.12 mg/dL (ref 0.61–1.24)
GFR, Estimated: >60 mL/min (ref >60)
Glucose, Bld: 139 mg/dL — ABNORMAL HIGH (ref 70–99)
Potassium: 5 mmol/L (ref 3.5–5.1)
Sodium: 138 mmol/L (ref 135–145)

## 2022-10-14 LAB — CBC
HCT: 26.4 % — ABNORMAL LOW (ref 39.0–52.0)
Hemoglobin: 8.1 g/dL — ABNORMAL LOW (ref 13.0–17.0)
MCH: 28.4 pg (ref 26.0–34.0)
MCHC: 30.7 g/dL (ref 30.0–36.0)
MCV: 92.6 fL (ref 80.0–100.0)
Platelets: 313 10*3/uL (ref 150–400)
RBC: 2.85 MIL/uL — ABNORMAL LOW (ref 4.22–5.81)
RDW: 15.4 % (ref 11.5–15.5)
WBC: 15.9 10*3/uL — ABNORMAL HIGH (ref 4.0–10.5)
nRBC: 0 % (ref 0.0–0.2)

## 2022-10-14 LAB — CULTURE, BLOOD (ROUTINE X 2)
Culture: NO GROWTH
Culture: NO GROWTH
Special Requests: ADEQUATE
Special Requests: ADEQUATE

## 2022-10-14 LAB — GLUCOSE, CAPILLARY
Glucose-Capillary: 114 mg/dL — ABNORMAL HIGH (ref 70–99)
Glucose-Capillary: 119 mg/dL — ABNORMAL HIGH (ref 70–99)
Glucose-Capillary: 128 mg/dL — ABNORMAL HIGH (ref 70–99)
Glucose-Capillary: 130 mg/dL — ABNORMAL HIGH (ref 70–99)
Glucose-Capillary: 136 mg/dL — ABNORMAL HIGH (ref 70–99)
Glucose-Capillary: 143 mg/dL — ABNORMAL HIGH (ref 70–99)
Glucose-Capillary: 157 mg/dL — ABNORMAL HIGH (ref 70–99)

## 2022-10-14 LAB — PHOSPHORUS: Phosphorus: 3.6 mg/dL (ref 2.5–4.6)

## 2022-10-14 LAB — MAGNESIUM: Magnesium: 2.1 mg/dL (ref 1.7–2.4)

## 2022-10-14 MED ORDER — POLYETHYLENE GLYCOL 3350 17 G PO PACK
34.0000 g | PACK | Freq: Once | ORAL | Status: AC
  Administered 2022-10-14: 34 g
  Filled 2022-10-14: qty 2

## 2022-10-14 MED ORDER — SODIUM BICARBONATE 650 MG PO TABS
650.0000 mg | ORAL_TABLET | Freq: Two times a day (BID) | ORAL | Status: AC
  Administered 2022-10-14 (×2): 650 mg
  Filled 2022-10-14 (×2): qty 1

## 2022-10-14 NOTE — Progress Notes (Signed)
Referral to EP for QT prolongation and torsades with need for psych medications.

## 2022-10-14 NOTE — TOC Progression Note (Signed)
Transition of Care [facility]) - Progression Note    Patient Details  Name: Shane Chambers MRN: [id number] Date of Birth: 04-22-1961  Transition of Care [facility]) CM/SW [place], LCSW Phone Number: 10/14/2022, 11:27 AM  Clinical Narrative:    CSW met with mother at bedside and corrected her number in the chart. CSW discussed potential LTACH referral and choices. She has requested [facility]. CSW requested [facility] review referral.    Expected Discharge Plan: Long Term Nursing Home Barriers to Discharge: Continued Medical Work up  Expected Discharge Plan and Services Expected Discharge Plan: [place] In-house Referral: Clinical Social Work     Living arrangements for the past 2 months: [place]                                       Social Determinants of Health (SDOH) Interventions    Readmission Risk Interventions    10/03/2022   10:08 AM 09/26/2022    1:16 PM 04/23/2022   10:20 AM  Readmission Risk Prevention Plan  Transportation Screening Complete Complete Complete  PCP or Specialist Appt within 3-5 Days  Complete Complete  HRI or [place]  Complete Complete  Social Work Consult for [place] Planning/Counseling  Complete Complete  Palliative Care Screening  Not Applicable Not Applicable  Medication Review Press photographer)  Complete Complete  HRI or Home Care Consult Complete    SW Recovery Care/Counseling Consult Complete    Palliative Care Screening Not Applicable    Skilled Nursing Facility Complete

## 2022-10-14 NOTE — Plan of Care (Signed)
Qtc is improving. No changes in recomendations

## 2022-10-14 NOTE — Progress Notes (Signed)
[facility] Progress Note  Patient: Shane Chambers     HYW:[id number]  DOA: 10/02/2022   PCP: Caprice Renshaw, MD       Brief hospital course:  This is a 61 year old male with diabetes mellitus, coronary artery disease status post CABG, COPD, hypertension, coronary artery disease, peripheral vascular disease who presents to the hospital with right leg pain and swelling. The patient was hospitalized from 11/16 through 11/22 for cellulitis of the right leg, treated with ceftriaxone and vancomycin and discharged with doxycycline and cefadroxil. Blood cultures grew Bacillus species which was felt to be a contaminant. Work up  significant for septic arthritis/osteomyelitis for which he required BKA.  Patient hospital course was complicated by significant encephalopathy and altered mental status.  It has been gradually improving.  As well he did develop torsades de points which resolved without intervention.  He is being followed by cardiology.   Subjective:   No significant events overnight, he denies any complaints today.   Assessment and Plan:  Cellulitis-septic arthritis/osteomyelitis - WBC noted to be 18.6 - Imaging with CT scan and MRI suggestive of right ankle septic arthritis with osteomyelitis.  - 11/26- s/p AKA -treated with vancomycin and cefepime.  -Further management per Dr. Sharol Given.  Fever/PNA - patient with temperature 100.4 11/30, chest x-ray significant for right upper lung opacity.  Was treated with IV Rocephin and Flagyl for aspiration pneumonia, -IV Flagyl discontinued given torsades. -Finished 5 days of IV Rocephin.  Delirium Acute metabolic encephalopathy -Certainly with some underlying vascular dementia, as has been living at nursing home for last 2 years, with evidence of significant trophy and CVA on CT head obtained yesterday. -Patient is screaming/yelling intermittently, unable to answer any commands or follow any questions , difficultly improved, but this  morning he appears to be more lethargic,. -Minimize narcotics and sedative medications  Torsades de pointes QTc -Cardiology input greatly appreciated, avoid prolonging agents, keep potassium>, magnesium> 2. -Will uptitrate metoprolol to 100 mg p.o. twice daily.  Hypernatremia -Resolved, will DC D5W, and decrease his free water via tube.   AKI with metabolic acidosis - Baseline creatinine is about 0.9-1.4 - Creatinine has been steadily rising peaked at 3.06 -Continue to hold losartan and torsemide - have asked for nephrology consult - renal ultrasound unremarkable - Cr is now improving  Acute blood loss anemia-anemia of chronic disease -Hemoglobin 7.7 today - transfused 1 U PRBC on 11/25 and again on 11/27 - anemia panel was obtained on 11/20-ferritin was 913 - iron saturation was low as was iron binding - IV Ferric gluconate given on 11/27 - cont to follow Hgb- has bloody drainage in wound vac and may need further transfusions    Type 2 diabetes mellitus ([place]) -Home medications include 70/30 and Glucophage -NovoLog sliding scale ordered -Hemoglobin A1C    Component Value Date/Time   HGBA1C 5.6 09/26/2022 0404   Hypothyroidism - Continue levothyroxine  Chronic diastolic heart failure/hypertension - Last echo from 2021 revealed grade 2 diastolic dysfunction with an EF of 55 to 60% - Diuretics currently on hold due to AKI -Continue metoprolol  History of coronary artery disease status post CABG - Continue aspirin and Lipitor  Hypertension -Overall blood pressure has much improved, will DC his clonidine given heart rate in the 50s, meanwhile we will continue with Toprol 100 mg oral twice daily.   -  Continue with as needed metoprolol and hydralazine.    BPH - Continue Flomax  Hypokalemia - repleted,     Code Status:  Full Code Consultants: Orthopedic surgery Level of Care: Level of care: Telemetry Medical Family communication: Discussed with mother at bedside  11/30, 12/2,12/5 DVT prophylaxis:  heparin injection 5,000 Units Start: 10/03/22 0600 SCDs Start: 10/03/22 0127     Objective:   Vitals:   10/13/22 2000 10/14/22 0000 10/14/22 0733 10/14/22 0815  BP: (!) 150/69 135/65  126/63  Pulse:    (!) 59  Resp: 19 (!) 21  15  Temp: 97.9 F (36.6 C) 97.9 F (36.6 C)  97.8 F (36.6 C)  TempSrc: Oral   Axillary  SpO2:   99%   Weight:      Height:       Filed Weights   10/05/22 1026  Weight: 118 kg   Exam:  Awake Alert, Oriented X 3, but he appears to be more lethargic today, less interactive. Symmetrical Chest wall movement, Good air movement bilaterally, CTAB RRR,No Gallops,Rubs or new Murmurs, No Parasternal Heave +ve B.Sounds, Abd Soft, No tenderness, No rebound - guarding or rigidity. No Cyanosis, Clubbing or edema, right BK   CBC: Recent Labs  Lab 10/11/22 0153 10/12/22 0030 10/12/22 0155 10/13/22 0428 10/14/22 0518  WBC 16.4* 14.4* 15.5* 14.3* 15.9*  NEUTROABS  --  10.0*  --   --   --   HGB 8.9* 9.1* 8.4* 8.2* 8.1*  HCT 28.5* 29.5* 28.5* 26.9* 26.4*  MCV 91.1 92.8 94.7 94.7 92.6  PLT 655* 489* 544* 361 875   Basic Metabolic Panel: Recent Labs  Lab 10/10/22 0456 10/10/22 1522 10/11/22 0153 10/11/22 1530 10/12/22 0030 10/12/22 0155 10/13/22 0428 10/14/22 0518  NA 152*   < > 151* 149* 147* 147* 139 138  K 3.3*   < > 3.5 3.7 3.8 3.7 3.8 5.0  CL 116*   < > 122* 122* 119* 117* 109 112*  CO2 20*   < > 21* 19* 20* 22 23 18*  GLUCOSE 191*   < > 199* 171* 170* 152* 168* 139*  BUN 29*   < > 31* 28* 28* 27* 29* 29*  CREATININE 1.37*   < > 1.48* 1.27* 1.26* 1.31* 1.15 1.12  CALCIUM 9.5   < > 9.2 8.5* 8.5* 8.9 8.2* 8.5*  MG 1.7  --  1.8  --  1.7 1.7 1.6* 2.1  PHOS 2.5  --  2.6  --   --  3.5 3.1 3.6   < > = values in this interval not displayed.   GFR: Estimated Creatinine Clearance: 90 mL/min (by C-G formula based on SCr of 1.12 mg/dL).  Scheduled Meds:  amLODipine  10 mg Oral Daily   arformoterol  15 mcg  Nebulization BID   aspirin EC  81 mg Oral Q breakfast   atorvastatin  80 mg Oral QHS   budesonide (PULMICORT) nebulizer solution  0.25 mg Nebulization BID   feeding supplement  237 mL Oral BID BM   feeding supplement (JEVITY 1.5 CAL/FIBER)  1,000 mL Per Tube Q24H   feeding supplement (PROSource TF20)  60 mL Per Tube Daily   free water  200 mL Per Tube Q4H   heparin  5,000 Units Subcutaneous Q8H   insulin aspart  0-15 Units Subcutaneous Q4H   ipratropium-albuterol  3 mL Nebulization BID   levothyroxine  75 mcg Oral QAC breakfast   magnesium oxide  400 mg Oral BID   metoprolol tartrate  100 mg Oral BID   oxyCODONE  5 mg Oral Q4H   pantoprazole (PROTONIX) IV  40 mg Intravenous Q24H  potassium chloride  20 mEq Oral Daily   senna-docusate  1 tablet Oral BID   sodium bicarbonate  650 mg Per Tube BID   tamsulosin  0.8 mg Oral Daily   thiamine  100 mg Oral Daily   Continuous Infusions:   Imaging and lab data was personally reviewed ECHOCARDIOGRAM COMPLETE  Result Date: 10/13/2022    ECHOCARDIOGRAM REPORT   Patient Name:   Shane Chambers Date of Exam: 10/13/2022 Medical Rec #:  [id number]       Height:       69.0 in Accession #:    [id number]      Weight:       260.1 lb Date of Birth:  [birth year]       BSA:          2.310 m Patient Age:    45 years        BP:           149/66 mmHg Patient Gender: M               HR:           68 bpm. Exam Location:  Inpatient Procedure: 2D Echo, Cardiac Doppler and Color Doppler Indications:    Ventricular Tachycardia I47.2  History:        Patient has prior history of Echocardiogram examinations, most                 recent 08/03/2020. Cardiomyopathy and CHF, CAD, Prior CABG,                 Signs/Symptoms:Shortness of Breath; Risk Factors:Hypertension,                 Sleep Apnea, Diabetes and Dyslipidemia. Prior CABG 03/13/20.  Sonographer:    Ronny Flurry Referring Phys: [place]  1. Left ventricular ejection fraction, by  estimation, is 55 to 60%. The left ventricle has normal function. The left ventricle has no regional wall motion abnormalities. There is moderate concentric left ventricular hypertrophy. Left ventricular diastolic parameters are indeterminate.  2. Right ventricular systolic function is normal. The right ventricular size is normal.  3. The mitral valve is normal in structure. Mild mitral valve regurgitation.  4. AV is thickened, calcified. Difficult to see well Peak and mean gradients through the valve are 21 and 12 mm Hg respectively. AVA (VTI) is 1.8 cm2. Dimensionless index is 0.5 consistent with mild AS.Marland Kitchen The aortic valve was not well visualized. Aortic valve regurgitation is not visualized. FINDINGS  Left Ventricle: Left ventricular ejection fraction, by estimation, is 55 to 60%. The left ventricle has normal function. The left ventricle has no regional wall motion abnormalities. The left ventricular internal cavity size was normal in size. There is  moderate concentric left ventricular hypertrophy. Left ventricular diastolic parameters are indeterminate. Right Ventricle: The right ventricular size is normal. Right vetricular wall thickness was not assessed. Right ventricular systolic function is normal. Left Atrium: Left atrial size was normal in size. Right Atrium: Right atrial size was normal in size. Pericardium: There is no evidence of pericardial effusion. Mitral Valve: The mitral valve is normal in structure. Mild mitral valve regurgitation. Tricuspid Valve: The tricuspid valve is normal in structure. Tricuspid valve regurgitation is mild. Aortic Valve: AV is thickened, calcified. Difficult to see well Peak and mean gradients through the valve are 21 and 12 mm Hg respectively. AVA (VTI) is 1.8 cm2. Dimensionless index is 0.5 consistent with mild AS. The  aortic valve was not well visualized. Aortic valve regurgitation is not visualized. Aortic valve mean gradient measures 10.5 mmHg. Aortic valve peak  gradient measures 18.1 mmHg. Aortic valve area, by VTI measures 2.07 cm. Pulmonic Valve: The pulmonic valve was normal in structure. Pulmonic valve regurgitation is not visualized. Aorta: The aortic root and ascending aorta are structurally normal, with no evidence of dilitation. IAS/Shunts: No atrial level shunt detected by color flow Doppler.  LEFT VENTRICLE PLAX 2D LVIDd:         5.10 cm   Diastology LVIDs:         3.60 cm   LV e' medial:    4.01 cm/s LV PW:         1.50 cm   LV E/e' medial:  30.1 LV IVS:        1.40 cm   LV e' lateral:   6.89 cm/s LVOT diam:     2.30 cm   LV E/e' lateral: 17.6 LV SV:         96 LV SV Index:   42 LVOT Area:     4.15 cm  RIGHT VENTRICLE            IVC RV S prime:     8.45 cm/s  IVC diam: 1.80 cm TAPSE (M-mode): 1.4 cm LEFT ATRIUM             Index        RIGHT ATRIUM           Index LA diam:        4.90 cm 2.12 cm/m   RA Area:     12.70 cm LA Vol (A2C):   72.7 ml 31.47 ml/m  RA Volume:   25.40 ml  10.99 ml/m LA Vol (A4C):   65.9 ml 28.53 ml/m LA Biplane Vol: 69.6 ml 30.13 ml/m  AORTIC VALVE AV Area (Vmax):    2.01 cm AV Area (Vmean):   1.90 cm AV Area (VTI):     2.07 cm AV Vmax:           213.00 cm/s AV Vmean:          151.500 cm/s AV VTI:            0.464 m AV Peak Grad:      18.1 mmHg AV Mean Grad:      10.5 mmHg LVOT Vmax:         103.00 cm/s LVOT Vmean:        69.400 cm/s LVOT VTI:          0.231 m LVOT/AV VTI ratio: 0.50  AORTA Ao Root diam: 3.30 cm Ao Asc diam:  3.60 cm MITRAL VALVE MV Area (PHT): 4.31 cm     SHUNTS MV Decel Time: 176 msec     Systemic VTI:  0.23 m MV E velocity: 121.00 cm/s  Systemic Diam: 2.30 cm MV A velocity: 62.70 cm/s MV E/A ratio:  1.93 Dorris Carnes MD Electronically signed by Dorris Carnes MD Signature Date/Time: 10/13/2022/2:37:40 PM    Final     LOS: 12 days   Author: Phillips Climes MD 10/14/2022 11:29 AM  To contact [facility]>   Check the care team in [employer] and look for the attending/consulting [facility] provider listed  Log into  [email or web address] and use [place]'s universal password   Go to> "[facility]"  and find provider  If you still have difficulty reaching the provider, please page the [facility] (Director on Call) for the  Hospitalists listed on amion

## 2022-10-14 NOTE — Progress Notes (Addendum)
[facility] Followup Face-to-Face Psychiatric Evaluation  Service Date: October 14, 2022 LOS:  LOS: 12 days   Assessment  Shane Chambers is a 61 y.o. male admitted medically for 10/02/2022  4:53 PM for cellulitis. He carries the psychiatric diagnoses of depression and has a past medical history of rectal cancer, CAD s/p CABG 2021, COPD, T2DM, HLD, ischemic cardiomyopathy, OSA, hypothyroidism, HTN, PVD . AMS, not taking amitriptyline for 3 days by Dr. Waldron Labs.   His current presentation of resolving altered mental status is most consistent with hypoactive delirium in the setting of underlying neurocognitive disorder. He meets criteria for delirium based on the fluctuating awareness that developed over a short period of time and continues to fluctuate. On initial exam, he was not responding to any questions or commands. On exam 12/4, he was alert and engaged in discussion. Today, patient is somnolent with poor attention during exam (could not spell WORLD backwards). He will answer questions with prompting and is oriented to self and DOB. Current outpatient psychotropic medications included elavil 21m, effexor 2270mdaily, gabapentin 60019mID. Over 12/2-3, patient had episode of Vtach and Torsades suspected to be secondary to QT prolongation. His antidepressants have been discontinued in this setting. Discussed with cardiology who did not recommend restarting any psychiatric meds that could prolong QT at this time. He could potentially restart in the future if his Qtc normalizes but would need very close follow-up with cardiology. At that time, would continue to re-evaluate for depression in the outpatient setting and would recommend starting remeron at that time given its minimal risk of prolonging QT. We also discussed with patient the importance of therapy to help with managing his depression. Patient also amenable to seeing Chaplain for additional support with his medical course.  Please see plan below for detailed recommendations.   Diagnoses:  Active Hospital problems: Principal Problem:   Cellulitis Active Problems:   Hypertension   Type 2 diabetes mellitus (HCC)   Hyperlipidemia   OSA (obstructive sleep apnea)   GERD (gastroesophageal reflux disease)   AKI (acute kidney injury) (HCCJones Subacute osteomyelitis, right ankle and foot (HCC)   Hypothyroidism   Hypoalbuminemia   Septic arthritis of right ankle (HCC)   Acute hypoactive delirium due to another medical condition    Plan  ## Safety and Observation Level:  - Based on my clinical evaluation, I estimate the patient to be at low risk of self harm in the current setting - At this time, we recommend a routine level of observation. This decision is based on my review of the chart including patient's history and current presentation, interview of the patient, mental status examination, and consideration of suicide risk including evaluating suicidal ideation, plan, intent, suicidal or self-harm behaviors, risk factors, and protective factors. This judgment is based on our ability to directly address suicide risk, implement suicide prevention strategies and develop a safety plan while the patient is in the clinical setting. Please contact our team if there is a concern that risk level has changed.  ## Medications:  -- STOP amitryptiline and effexor given Torsades caused by QT prolonging agents  -- Could potentially consider starting remeron as an outpatient if his QTc normalizes but would recommend coordination with cardiology prior to starting this -- Pending chaplain  -- Consider pulmonology referral to treat OSA while inpatient  -- Minimize sedating medications   ## Medical Decision Making Capacity:  -- Not assessed   ## Further Work-up:  Per primary   --  most recent EKG on 12/5 had Qtc of 510 -- Pertinent labwork reviewed earlier this admission includes: CO2 20, pCO2 29 (12/1) -- 11/29 CT head with  no acute intracranial abnormality, diffuse parenchymal volume loss that is relatively advanced in relation to patient's age.   ## Disposition:  -- TBD  ## Behavioral / Environmental:  -- Delirium precautions   ##Legal Status -Voluntary  Thank you for this consult request. Recommendations have been communicated to the primary team.  We will sign off at this time.   Rolanda Lundborg, MD, PGY-1   Followup history  Relevant Aspects of Hospital Course:  Admitted on 10/02/2022 for cellulitis.  12/2 evening: Transient run of Vtach. CODE BLUE due to unresponsiveness with agnoal breathing. Cardiology consulted, noted new onset of QT prolongation which they suspect to be the cause of his torsades and recommended avoiding QT prolonging medications.   Patient Report:  10/10/22: Patient with eyes opened, intermittently tracking. Does not answer questions or speak spontaneously. Per nurse, patient has been occasionally difficult to arouse in the morning. Otherwise he has been alert but non-verbal   10/11/22: Patient seen face to face lying in his hospital bed, his mother at his bedside. Today, he is awake, alert, oriented x 3, calm, cooperative with slow but audible speech. He states that he is making a slow but steady progress as evidence by increase alertness, self motivation and decreased anxiety and depressive symptoms. However, he is still concern by his inability to speak fluently but appreciate his improvement on current medication dosage-Effexor XR 150 mg daily. Overall, patient denies psychosis, delusions, mood swings and self harming thoughts.  10/13/22: Pt aware of current medical status and accurately describes current cardiology workup. He is much more engaged in evaluation than prior eval by this team 12/4. He has ongoing marked short term memory deficits and difficulty answering many timeline questions. Aware of these deficits. He had been seeing [facility] for depression and adjustment  disorder. No suicidal thoughts today, homicidal thoughts today. Has been having intermittent hallucinations since he got his surgery done primarily has decided that one of the departments here are haunted. Sounds like this is largely to do with unfamiliar faces around him while hospitalized - seems to have good insight into this not being real. Fairly oriented today - hospital, city, year, month. Thought it was Tuesday but was unsure about this. Discussed with patient importance of therapy follow-up for his mental health. Discussed with patient having the chaplain come by and talk with him and he was agreeable.   10/14/22 Patient seen with mom at bedside. He reports SOB sometimes. He is oriented to self and date of birth. He reports that he is tired. He has poor attention during interview today - cannot spell WORLD backwards or finish orientation questions. He does not answer when asked about year, month, or city. He reports visual hallucinations of ghosts at night and he endorses auditory hallucinations but does not specify.   ROS:  As above   Collateral information:  Mom's phone number out of service. Provider saw patient with mother at bedside on 12/2.   Met with mom at bedside 12/5. Mom was not aware that patient had depression or was taking antidepressant medications. Reports at baseline he is in good spirits.   Psychiatric History:  Information collected from chart review Seen by [facility], unspecified depressive disorder and adjustment disorder are prior diagnoses No prior psychiatric admissions Home psychiatric meds include amitriptyline 75 QHS, effexor 225 daily.  Family psych history: mother history of mental disorder  Social History:  Per chart review, reports that he quit smoking about 27 years ago. His smoking use included cigarettes. He smoked an average of 1.5 packs per day. He has never used smokeless tobacco. He reports that he does not currently use alcohol. He reports that he  does not use drugs.   Family History:  The patient's family history includes Hypertension in his mother.  Medical History: Past Medical History:  Diagnosis Date   Anemia    Arthritis    CAD (coronary artery disease)    a. s/p CABG in 03/2020 with LIMA-LAD, RIMA-PL, [id number]   Cancer ([place])    rectal   Cellulitis    COPD (chronic obstructive pulmonary disease) ([place])    Depression    Diabetes mellitus without complication (HCC)    GERD (gastroesophageal reflux disease)    History of kidney stones    Hyperlipidemia 12/01/2019   Hypertension    Hypothyroidism    Ischemic cardiomyopathy    a. EF 20-25% by echo in 02/2020 b. at 40% by echo in 03/2020 c. EF normalized to 60-65% by echo in 04/2020   Myocardial infarction [facility])    Peripheral vascular disease ([place])    Sleep apnea    Type 2 diabetes mellitus ([place])     Surgical History: Past Surgical History:  Procedure Laterality Date   AMPUTATION Right 10/05/2022   Procedure: AMPUTATION BELOW KNEE;  Surgeon: Newt Minion, MD;  Location: [place];  Service: Orthopedics;  Laterality: Right;   APPLICATION OF WOUND VAC Right 10/05/2022   Procedure: APPLICATION OF WOUND VAC;  Surgeon: Newt Minion, MD;  Location: [place];  Service: Orthopedics;  Laterality: Right;   BIOPSY  07/18/2021   Procedure: BIOPSY;  Surgeon: Eloise Harman, DO;  [facility];  Service: Endoscopy;;   BIOPSY  01/09/2022   Procedure: BIOPSY;  Surgeon: Eloise Harman, DO;  [facility];  Service: Endoscopy;;   COLONOSCOPY WITH PROPOFOL N/A 07/18/2021   [place]: 15 millimeter polyp removed from the sigmoid colon, 5 mm polyp removed from sigmoid colon.  Nonbleeding internal hemorrhoids.  Significant looping of the colon. sigmoid path showed invasive colonic adenocarcinoma involving tubular adenoma (invades to depth of 84m, carcinoma 138mfrom margin, no lymphovascular invasion, no poorly differentiated component.   CORONARY ARTERY BYPASS GRAFT  N/A 03/13/2020   Procedure: CORONARY ARTERY BYPASS GRAFTING (CABG) times five using bilateral Internal mammary arteries and left radial artery;  Surgeon: AtWonda OldsMD;  [facility] Service: Open Heart Surgery;  Laterality: N/A;   DENTAL SURGERY     ESOPHAGOGASTRODUODENOSCOPY (EGD) WITH PROPOFOL N/A 07/18/2021   [place]: 1 gastric polyp status post biopsy, gastritis. gastric bx with slight chronic inflammation and no H.pyori. GEJ polypectomy with mild inflammation only   FLEXIBLE SIGMOIDOSCOPY N/A 08/26/2021   [place]: Nonbleeding internal hemorrhoids.  15 mm ulcers from previous polypectomy found in the rectum.  No evidence of previous polyp.  Located 5 to 8 cm from anal verge.   FLEXIBLE SIGMOIDOSCOPY N/A 01/09/2022   Procedure: FLEXIBLE SIGMOIDOSCOPY;  Surgeon: CaEloise HarmanDO;  [facility];  Service: Endoscopy;  Laterality: N/A;   POLYPECTOMY  07/18/2021   Procedure: POLYPECTOMY INTESTINAL;  Surgeon: CaEloise HarmanDO;  [facility];  Service: Endoscopy;;   RADIAL ARTERY HARVEST Left 03/13/2020   Procedure: [place]  Surgeon: AtWonda OldsMD;  [facility]  Service: Open Heart Surgery;  Laterality: Left;   RIGHT/LEFT HEART CATH AND CORONARY ANGIOGRAPHY N/A 03/07/2020   Procedure: RIGHT/LEFT HEART CATH AND CORONARY ANGIOGRAPHY;  Surgeon: Sherren Mocha, MD;  Location: [place] [facility];  Service: Cardiovascular;  Laterality: N/A;   SUBMUCOSAL LIFTING INJECTION  01/09/2022   Procedure: SUBMUCOSAL LIFTING INJECTION;  Surgeon: Eloise Harman, DO;  [facility];  Service: Endoscopy;;   SUBMUCOSAL TATTOO INJECTION  01/09/2022   Procedure: SUBMUCOSAL TATTOO INJECTION;  Surgeon: Eloise Harman, DO;  [facility];  Service: Endoscopy;;   TEE WITHOUT CARDIOVERSION N/A 03/13/2020   Procedure: TRANSESOPHAGEAL ECHOCARDIOGRAM (TEE);  Surgeon: [name], MD;  Location: [place];  Service: Open Heart Surgery;   Laterality: N/A;    Medications:   Current Facility-Administered Medications:    alum & mag hydroxide-simeth (MAALOX/MYLANTA) 200-200-20 MG/5ML suspension 30 mL, 30 mL, Oral, Q6H PRN, Elgergawy, Silver Huguenin, MD, 30 mL at 10/13/22 2018   amLODipine (NORVASC) tablet 10 mg, 10 mg, Oral, Daily, Elgergawy, Silver Huguenin, MD, 10 mg at 10/14/22 0901   arformoterol (BROVANA) nebulizer solution 15 mcg, 15 mcg, Nebulization, BID, Elgergawy, Silver Huguenin, MD, 15 mcg at 10/14/22 6967   aspirin EC tablet 81 mg, 81 mg, Oral, Q breakfast, Zierle-Ghosh, Asia B, DO, 81 mg at 10/14/22 0901   atorvastatin (LIPITOR) tablet 80 mg, 80 mg, Oral, QHS, Elgergawy, Silver Huguenin, MD, 80 mg at 10/13/22 2032   budesonide (PULMICORT) nebulizer solution 0.25 mg, 0.25 mg, Nebulization, BID, Elgergawy, Silver Huguenin, MD, 0.25 mg at 10/14/22 8938   feeding supplement (ENSURE ENLIVE / ENSURE PLUS) liquid 237 mL, 237 mL, Oral, BID BM, Elgergawy, Silver Huguenin, MD, 237 mL at 10/14/22 1322   feeding supplement (JEVITY 1.5 CAL/FIBER) liquid 1,000 mL, 1,000 mL, Per Tube, Q24H, Elgergawy, Silver Huguenin, MD   feeding supplement (PROSource TF20) liquid 60 mL, 60 mL, Per Tube, Daily, Elgergawy, Silver Huguenin, MD, 60 mL at 10/14/22 0902   free water 200 mL, 200 mL, Per Tube, Q4H, Elgergawy, Silver Huguenin, MD, 200 mL at 10/14/22 1322   heparin injection 5,000 Units, 5,000 Units, Subcutaneous, Q8H, Zierle-Ghosh, Asia B, DO, 5,000 Units at 10/14/22 1322   HYDROcodone-acetaminophen (NORCO/VICODIN) 5-325 MG per tablet 1 tablet, 1 tablet, Oral, Q6H PRN, Elgergawy, Dawood S, MD   insulin aspart (novoLOG) injection 0-15 Units, 0-15 Units, Subcutaneous, Q4H, Elgergawy, Silver Huguenin, MD, 2 Units at 10/14/22 0859   ipratropium-albuterol (DUONEB) 0.5-2.5 (3) MG/3ML nebulizer solution 3 mL, 3 mL, Nebulization, BID, Elgergawy, Silver Huguenin, MD, 3 mL at 10/14/22 0733   labetalol (NORMODYNE) injection 10 mg, 10 mg, Intravenous, Q4H PRN, Howerter, Justin B, DO, 10 mg at 10/11/22 0458   levothyroxine  (SYNTHROID) tablet 75 mcg, 75 mcg, Oral, QAC breakfast, Elgergawy, Silver Huguenin, MD, 75 mcg at 10/14/22 0610   magnesium oxide (MAG-OX) tablet 400 mg, 400 mg, Oral, BID, Elgergawy, Silver Huguenin, MD, 400 mg at 10/14/22 0902   metoprolol tartrate (LOPRESSOR) tablet 100 mg, 100 mg, Oral, BID, Elgergawy, Silver Huguenin, MD, 100 mg at 10/14/22 1017   oxyCODONE (Oxy IR/ROXICODONE) immediate release tablet 5 mg, 5 mg, Oral, Q4H, Elgergawy, Silver Huguenin, MD, 5 mg at 10/14/22 1322   pantoprazole (PROTONIX) injection 40 mg, 40 mg, Intravenous, Q24H, Howerter, Justin B, DO, 40 mg at 10/13/22 2027   polyethylene glycol (MIRALAX / GLYCOLAX) packet 17 g, 17 g, Oral, Daily PRN, Zierle-Ghosh, Asia B, DO   senna-docusate (Senokot-S) tablet 1 tablet, 1 tablet, Oral, BID, Elgergawy, Silver Huguenin,  MD, 1 tablet at 10/14/22 0901   sodium bicarbonate tablet 650 mg, 650 mg, Per Tube, BID, Elgergawy, Silver Huguenin, MD, 650 mg at 10/14/22 1322   tamsulosin (FLOMAX) capsule 0.8 mg, 0.8 mg, Oral, Daily, Zierle-Ghosh, Asia B, DO, 0.8 mg at 10/14/22 8099   thiamine (VITAMIN B1) tablet 100 mg, 100 mg, Oral, Daily, Elgergawy, Silver Huguenin, MD, 100 mg at 10/14/22 0901  Allergies: Allergies  Allergen Reactions   Ace Inhibitors Swelling and Cough    (Not on MAR at [facility])   Haloperidol And Related     Do NOT give anti-psychotics due to risk of  Torsades and QT prolongation (per Dr. Graciela Husbands request)   Other Itching    [name]   Shellfish Allergy    Penicillins Itching and Rash    Has patient had a PCN reaction causing immediate rash, facial/tongue/throat swelling, SOB or lightheadedness with hypotension: Unknown Has patient had a PCN reaction causing severe rash involving mucus membranes or skin necrosis: Unknown Has patient had a PCN reaction that required hospitalization: Unknown Has patient had a PCN reaction occurring within the last 10 years: Unknown If all of the above answers are "NO", then may proceed with  Cephalosporin use.      Objective  Vital signs:  Temp:  [97.5 F (36.4 C)-97.9 F (36.6 C)] 97.5 F (36.4 C) (12/05 1212) Pulse Rate:  [52-59] 52 (12/05 1212) Resp:  [15-21] 20 (12/05 1212) BP: (111-150)/(59-69) 111/59 (12/05 1212) SpO2:  [99 %] 99 % (12/05 0733)  Mental Status Exam   Appearance and Grooming: Patient is laying in bed, intermittently opens eyes more but appears somnolent. The patient has no noticeable scent or odor.  Motor activity: The patient's movement speed and gait were not observed during encounter. There was no notable abnormal facial movements and no notable abnormal extremity movements. Behavior: The patient appears in no acute distress, and during the interview, was somnolent and required frequent redirection; he was able to follow commands and compliant to requests and made good eye contact. The patient did not appear internally or externally preoccupied. Attitude: Patient's attitude towards the interviewer was cooperative and open. Speech: The patient's speech was clear, fluent, with good articulation, and with appropriately placed inflections. The volume of his speech was normal and normal in quantity. The rate was normal with a normal rhythm. Responses were normal in latency. There were no abnormal patterns in speech. Mood: "Tired" Affect: Patient's affect is blunted with broad range and even fluctuations; his affect is congruent with his stated mood. ------------------------------------------------------------------------------------------------------------------------- Thought Content The patient experiences visual hallucinations, specifically, ghosts at night  currently. The patient describes no delusional thoughts; he denies thought insertion, denies thought withdrawal, denies thought interruption, and denies thought broadcasting. Patient at the time of interview denies active suicidal intent and denies passive suicidal ideation; he denies homicidal  intent. Thought Process The patient's thought process is linear and is goal-directed. Insight The patient at the time of interview demonstrates poor insight Judgement The patient over the past 24 hours demonstrates fair judgement  Memory: Limited  Executive Functions  Concentration: Poor Attention Span: Poor, unable to state WORLD backwards. Unable to complete orientation testing and required redirection frequently Recall: Poor Fund of Knowledge: Fair  Alertness/Orientation: Somnolent, oriented to self and date of birth.   Physical Exam Constitutional:      Appearance: the patient is chronically ill-appearing  Pulmonary:     Effort: Pulmonary effort is normal.  Neurological:  General: No focal deficit present.     Mental Status: the patient is somnolent and oriented to self and date of birth.   Review of Systems  Respiratory:  Positive for occasional shortness of breath.   Cardiovascular:  Negative for chest pain.  Gastrointestinal:  Negative for abdominal pain.   Blood pressure (!) 111/59, pulse (!) 52, temperature (!) 97.5 F (36.4 C), temperature source Oral, resp. rate 20, height 5' 9.02" (1.753 m), weight 118 kg, SpO2 99 %. Body mass index is 38.4 kg/m.

## 2022-10-15 DIAGNOSIS — M86271 Subacute osteomyelitis, right ankle and foot: Secondary | ICD-10-CM | POA: Diagnosis not present

## 2022-10-15 DIAGNOSIS — E8809 Other disorders of plasma-protein metabolism, not elsewhere classified: Secondary | ICD-10-CM | POA: Diagnosis not present

## 2022-10-15 DIAGNOSIS — N179 Acute kidney failure, unspecified: Secondary | ICD-10-CM | POA: Diagnosis not present

## 2022-10-15 DIAGNOSIS — I10 Essential (primary) hypertension: Secondary | ICD-10-CM | POA: Diagnosis not present

## 2022-10-15 LAB — GLUCOSE, CAPILLARY
Glucose-Capillary: 137 mg/dL — ABNORMAL HIGH (ref 70–99)
Glucose-Capillary: 165 mg/dL — ABNORMAL HIGH (ref 70–99)
Glucose-Capillary: 165 mg/dL — ABNORMAL HIGH (ref 70–99)
Glucose-Capillary: 172 mg/dL — ABNORMAL HIGH (ref 70–99)
Glucose-Capillary: 183 mg/dL — ABNORMAL HIGH (ref 70–99)
Glucose-Capillary: 195 mg/dL — ABNORMAL HIGH (ref 70–99)

## 2022-10-15 LAB — BASIC METABOLIC PANEL
Anion gap: 9 (ref 5–15)
BUN: 28 mg/dL — ABNORMAL HIGH (ref 6–20)
CO2: 23 mmol/L (ref 22–32)
Calcium: 9.2 mg/dL (ref 8.9–10.3)
Chloride: 107 mmol/L (ref 98–111)
Creatinine, Ser: 1.18 mg/dL (ref 0.61–1.24)
GFR, Estimated: >60 mL/min (ref >60)
Glucose, Bld: 180 mg/dL — ABNORMAL HIGH (ref 70–99)
Potassium: 4.6 mmol/L (ref 3.5–5.1)
Sodium: 139 mmol/L (ref 135–145)

## 2022-10-15 LAB — CBC
HCT: 25.9 % — ABNORMAL LOW (ref 39.0–52.0)
Hemoglobin: 8.3 g/dL — ABNORMAL LOW (ref 13.0–17.0)
MCH: 29.3 pg (ref 26.0–34.0)
MCHC: 32 g/dL (ref 30.0–36.0)
MCV: 91.5 fL (ref 80.0–100.0)
Platelets: 327 10*3/uL (ref 150–400)
RBC: 2.83 MIL/uL — ABNORMAL LOW (ref 4.22–5.81)
RDW: 15.6 % — ABNORMAL HIGH (ref 11.5–15.5)
WBC: 15.6 10*3/uL — ABNORMAL HIGH (ref 4.0–10.5)
nRBC: 0 % (ref 0.0–0.2)

## 2022-10-15 LAB — MAGNESIUM: Magnesium: 2 mg/dL (ref 1.7–2.4)

## 2022-10-15 NOTE — Plan of Care (Signed)
  Problem: Clinical Measurements: Goal: Ability to avoid or minimize complications of infection will improve Outcome: Progressing   Problem: Skin Integrity: Goal: Skin integrity will improve Outcome: Progressing   Problem: Education: Goal: Knowledge of General Education information will improve Description: Including pain rating scale, medication(s)/side effects and non-pharmacologic comfort measures Outcome: Progressing   Problem: Health Behavior/Discharge Planning: Goal: Ability to manage health-related needs will improve Outcome: Progressing   

## 2022-10-15 NOTE — TOC Progression Note (Addendum)
Transition of Care [facility]) - Progression Note    Patient Details  Name: Shane Chambers MRN: [id number] Date of Birth: February 18, 1961  Transition of Care [facility] CM/SW [place], LCSW Phone Number: 10/15/2022, 10:02 AM  Clinical Narrative:    Per [facility], patient does not meet criteria. [facility] review as well and stated patient does not meet criteria. Patient will [facility] to [facility] when stable. CSW updated patient's mother.    Expected Discharge Plan: Long Term Nursing Home Barriers to Discharge: Continued Medical Work up  Expected Discharge Plan and Services Expected Discharge Plan: [place] In-house Referral: Clinical Social Work     Living arrangements for the past 2 months: [place]                                       Social Determinants of Health (SDOH) Interventions    Readmission Risk Interventions    10/03/2022   10:08 AM 09/26/2022    1:16 PM 04/23/2022   10:20 AM  Readmission Risk Prevention Plan  Transportation Screening Complete Complete Complete  PCP or Specialist Appt within 3-5 Days  Complete Complete  HRI or [place]  Complete Complete  Social Work Consult for [place] Planning/Counseling  Complete Complete  Palliative Care Screening  Not Applicable Not Applicable  Medication Review [employer])  Complete Complete  HRI or Home Care Consult Complete    SW Recovery Care/Counseling Consult Complete    Palliative Care Screening Not Applicable    Skilled Nursing Facility Complete

## 2022-10-15 NOTE — Plan of Care (Signed)
  Problem: Clinical Measurements: Goal: Ability to avoid or minimize complications of infection will improve Outcome: Progressing   Problem: Skin Integrity: Goal: Skin integrity will improve Outcome: Progressing   Problem: Education: Goal: Knowledge of General Education information will improve Description: Including pain rating scale, medication(s)/side effects and non-pharmacologic comfort measures Outcome: Progressing   Problem: Health Behavior/Discharge Planning: Goal: Ability to manage health-related needs will improve Outcome: Progressing   Problem: Clinical Measurements: Goal: Ability to maintain clinical measurements within normal limits will improve Outcome: Progressing Goal: Will remain free from infection Outcome: Progressing Goal: Diagnostic test results will improve Outcome: Progressing Goal: Respiratory complications will improve Outcome: Progressing Goal: Cardiovascular complication will be avoided Outcome: Progressing   Problem: Activity: Goal: Risk for activity intolerance will decrease Outcome: Progressing   Problem: Nutrition: Goal: Adequate nutrition will be maintained Outcome: Progressing   Problem: Coping: Goal: Level of anxiety will decrease Outcome: Progressing   Problem: Elimination: Goal: Will not experience complications related to bowel motility Outcome: Progressing Goal: Will not experience complications related to urinary retention Outcome: Progressing   Problem: Pain Managment: Goal: General experience of comfort will improve Outcome: Progressing   Problem: Safety: Goal: Ability to remain free from injury will improve Outcome: Progressing   Problem: Skin Integrity: Goal: Risk for impaired skin integrity will decrease Outcome: Progressing   Problem: Education: Goal: Ability to describe self-care measures that may prevent or decrease complications (Diabetes Survival Skills Education) will improve Outcome: Progressing Goal:  Individualized Educational Video(s) Outcome: Progressing   Problem: Coping: Goal: Ability to adjust to condition or change in health will improve Outcome: Progressing   Problem: Fluid Volume: Goal: Ability to maintain a balanced intake and output will improve Outcome: Progressing   Problem: Health Behavior/Discharge Planning: Goal: Ability to identify and utilize available resources and services will improve Outcome: Progressing Goal: Ability to manage health-related needs will improve Outcome: Progressing   Problem: Metabolic: Goal: Ability to maintain appropriate glucose levels will improve Outcome: Progressing   Problem: Nutritional: Goal: Maintenance of adequate nutrition will improve Outcome: Progressing Goal: Progress toward achieving an optimal weight will improve Outcome: Progressing   Problem: Skin Integrity: Goal: Risk for impaired skin integrity will decrease Outcome: Progressing   Problem: Tissue Perfusion: Goal: Adequacy of tissue perfusion will improve Outcome: Progressing   Problem: Education: Goal: Knowledge of the prescribed therapeutic regimen will improve Outcome: Progressing Goal: Ability to verbalize activity precautions or restrictions will improve Outcome: Progressing Goal: Understanding of discharge needs will improve Outcome: Progressing   Problem: Activity: Goal: Ability to perform//tolerate increased activity and mobilize with assistive devices will improve Outcome: Progressing   Problem: Clinical Measurements: Goal: Postoperative complications will be avoided or minimized Outcome: Progressing   Problem: Self-Care: Goal: Ability to meet self-care needs will improve Outcome: Progressing   Problem: Self-Concept: Goal: Ability to maintain and perform role responsibilities to the fullest extent possible will improve Outcome: Progressing   Problem: Pain Management: Goal: Pain level will decrease with appropriate interventions Outcome:  Progressing   Problem: Skin Integrity: Goal: Demonstration of wound healing without infection will improve Outcome: Progressing

## 2022-10-15 NOTE — Care Plan (Signed)
QTc prolongation improved. Can follow peripherally. He has follow-up

## 2022-10-15 NOTE — Progress Notes (Signed)
PROGRESS NOTE        PATIENT DETAILS Name: Shane Chambers Age: 61 y.o. Sex: male Date of Birth: October 30, 1961 Admit Date: 10/02/2022 Admitting Physician Rolla Plate, DO BEE:FEOFH, Fara Olden, MD  Brief Summary: Patient is a 61 y.o.  male with history of CAD s/p CABG, COPD, HTN, DM-2, PAD who was just hospitalized from 11/16-11/22 for cellulitis of right leg-presented with worsening right leg pain-upon further evaluation-he was found to have severe soft tissue infection of the right leg with septic right ankle and osteomyelitis.  Patient was evaluated by orthopedics-underwent BKA-subsequently hospital course complicated by severe encephalopathy, and torsades de points.  Significant events: 11/16-11/22>> hospitalization for RLE cellulitis-discharged on oral antibiotics. 11/23>> admit to [facility] cellulitis-osteomyelitis/septic arthritis. 11/26>> BKA 12/02>> torsades de points-spontaneously converted to sinus rhythm.  Significant studies: 11/23>> CT right leg: Severe cellulitis/probable myofascitis-multiple complex fluid collections around the ankle-erosive changes along the deep cortex of the fibula, medial malleolus-suspicious for osteomyelitis. 11/24>> MRI right leg: Suspicious for right ankle septic arthritis with periarticular osteomyelitis.  Moderate-sized tibiotalar joint effusion. 11/26>> renal ultrasound: Normal 11/29>> CT head: No acute intracranial abnormality. 11/30>> hazy airspace opacity left upper lobe. 12/04>> echo: EF 55-60%  Significant microbiology data: 11/30>> blood culture: Negative  Procedures: 11/26>> BKA Sharol Given) 12/01>>[place]   Consults: Orthopedics Psychiatry Nephrology Cardiology  Subjective: Lying comfortably in bed-denies any chest pain or shortness of breath.  Objective: Vitals: Blood pressure (!) 111/59, pulse (!) 52, temperature (!) 97.5 F (36.4 C), temperature source Oral, resp. rate 20, height 5' 9.02"  (1.753 m), weight 117.5 kg, SpO2 100 %.   Exam: Gen Exam:Alert awake-not in any distress HEENT:atraumatic, normocephalic Chest: B/L clear to auscultation anteriorly CVS:S1S2 regular Abdomen:soft non tender, non distended Extremities:no edema-s/p right BKA. Neurology: Non focal Skin: no rash  Pertinent Labs/Radiology:    Latest Ref Rng & Units 10/15/2022    3:56 AM 10/14/2022    5:18 AM 10/13/2022    4:28 AM  CBC  WBC 4.0 - 10.5 K/uL 15.6  15.9  14.3   Hemoglobin 13.0 - 17.0 g/dL 8.3  8.1  8.2   Hematocrit 39.0 - 52.0 % 25.9  26.4  26.9   Platelets 150 - 400 K/uL 327  313  361     Lab Results  Component Value Date   NA 139 10/15/2022   K 4.6 10/15/2022   CL 107 10/15/2022   CO2 23 10/15/2022     Assessment/Plan: Right lower extremity severe soft tissue infection with acute osteomyelitis right ankle septic arthritis Evaluated by orthopedics-s/p BKA on 11/26 Completed course of antibiotics Defer further to orthopedics Mobilize with PT/OT  SIRS Aspiration PNA Afebrile Suspected may have had an aspiration event when he had torsades/delirium Completed course of antibiotics Maintain aspiration precautions.  Torsades de points on 12/02 QTc elongation due to antipsychotics QTc much better as of EKG on 12/6 Keep K>4, Mg>2 Telemetry monitoring Avoid QTc prolonging agents  AKI on CKD stage IIIb Hemodynamically mediated Resolved-creatinine now at baseline.  Acute metabolic encephalopathy Due to AKI/RLE soft tissue infection Some suspicion for underlying dementia Significantly improved-awake/alert answering all questions appropriately Supportive care Continue delirium precautions.  Normocytic anemia Acute blood loss anemia-s/p RLE BKA Hb now stable-has chronic normocytic anemia at baseline-with some component of perioperative blood loss post BKA. S/p 2 units of PRBC so far. Follow CBC periodically.  Hypernatremia  Resolved  CAD s/p CABG No anginal  symptoms Aspirin/statin/beta-blocker  Chronic HFpEF Euvolemic Not on diuretics-resume when able  HTN Stable on beta-blocker  DM2 (A1c 5.6 on 11/17) Stable on SSI  Recent Labs    10/14/22 2334 10/15/22 0359 10/15/22 0832  GLUCAP 157* 165* 172*     Hypothyroidism Levothyroxine  BPH Flomax  Depression Currently off all antidepressant medications given torsade/QTc prolongation Per psych-could consider initiation of Remeron in the future  Debility/deconditioning/functional quadriplegia SNF planned on discharge.  Nutrition Status: Nutrition Problem: Increased nutrient needs Etiology: wound healing Signs/Symptoms: estimated needs Interventions: Ensure Enlive (each supplement provides 350kcal and 20 grams of protein), Tube feeding Now on nocturnal tube feeds-if has adequate oral intake during daytime-suspect could remove NG tube.  Pressure Ulcer: Pressure Injury 10/03/22 Buttocks Stage 1 -  Intact skin with non-blanchable redness of a localized area usually over a bony prominence. (Active)  10/03/22 0220  Location: Buttocks  Location Orientation:   Staging: Stage 1 -  Intact skin with non-blanchable redness of a localized area usually over a bony prominence.  Wound Description (Comments):   Present on Admission: Yes  Dressing Type Foam - Lift dressing to assess site every shift 10/14/22 2000    Morbid Obesity: Estimated body mass index is 38.24 kg/m as calculated from the following:   Height as of this encounter: 5' 9.02" (1.753 m).   Weight as of this encounter: 117.5 kg.   Code status:   Code Status: Full Code   DVT Prophylaxis: heparin injection 5,000 Units Start: 10/03/22 0600 SCDs Start: 10/03/22 0127   Family Communication: None at bedside   Disposition Plan: Status is: Inpatient Remains inpatient appropriate because: severity of illness   Planned Discharge Destination:Skilled nursing facility   Diet: Diet Order             Diet regular  Room service appropriate? Yes; Fluid consistency: Thin  Diet effective now                     Antimicrobial agents: Anti-infectives (From admission, onward)    Start     Dose/Rate Route Frequency Ordered Stop   10/10/22 0815  cefTRIAXone (ROCEPHIN) 2 g in sodium chloride 0.9 % 100 mL IVPB        2 g 200 mL/hr over 30 Minutes Intravenous Every 24 hours 10/10/22 0716 10/14/22 0936   10/10/22 0815  metroNIDAZOLE (FLAGYL) IVPB 500 mg  Status:  Discontinued        500 mg 100 mL/hr over 60 Minutes Intravenous Every 12 hours 10/10/22 0716 10/12/22 1345   10/07/22 2200  cefTRIAXone (ROCEPHIN) 2 g in sodium chloride 0.9 % 100 mL IVPB        2 g 200 mL/hr over 30 Minutes Intravenous Every 24 hours 10/07/22 1005 10/08/22 2252   10/07/22 0845  ceFEPIme (MAXIPIME) 2 g in sodium chloride 0.9 % 100 mL IVPB  Status:  Discontinued        2 g 200 mL/hr over 30 Minutes Intravenous Every 12 hours 10/07/22 0745 10/07/22 1004   10/07/22 0845  vancomycin (VANCOREADY) IVPB 1250 mg/250 mL        1,250 mg 166.7 mL/hr over 90 Minutes Intravenous Every 24 hours 10/07/22 0747 10/08/22 0954   10/05/22 1000  vancomycin variable dose per unstable renal function (pharmacist dosing)  Status:  Discontinued         Does not apply See admin instructions 10/05/22 0937 10/05/22 1915   10/05/22 0956  ceFAZolin (  ANCEF) 3-0.9 GM/100ML-% IVPB       Note to Pharmacy: Nyoka Cowden D: cabinet override      10/05/22 0956 10/05/22 2159   10/05/22 0600  ceFAZolin (ANCEF) IVPB 3g/100 mL premix  Status:  Discontinued        3 g 200 mL/hr over 30 Minutes Intravenous On call to O.R. 10/04/22 2058 10/04/22 2116   10/03/22 2200  vancomycin (VANCOREADY) IVPB 1250 mg/250 mL  Status:  Discontinued        1,250 mg 166.7 mL/hr over 90 Minutes Intravenous Every 24 hours 10/03/22 1104 10/05/22 0937   10/03/22 1000  cefTRIAXone (ROCEPHIN) 2 g in sodium chloride 0.9 % 100 mL IVPB  Status:  Discontinued        2 g 200 mL/hr over 30  Minutes Intravenous Every 24 hours 10/02/22 2140 10/02/22 2144   10/03/22 0500  vancomycin (VANCOREADY) IVPB 750 mg/150 mL  Status:  Discontinued        750 mg 150 mL/hr over 60 Minutes Intravenous Every 12 hours 10/02/22 1731 10/03/22 1104   10/03/22 0500  ceFEPIme (MAXIPIME) 2 g in sodium chloride 0.9 % 100 mL IVPB        2 g 200 mL/hr over 30 Minutes Intravenous Every 12 hours 10/02/22 2151 10/06/22 1800   10/03/22 0100  ceFEPIme (MAXIPIME) 2 g in sodium chloride 0.9 % 100 mL IVPB  Status:  Discontinued        2 g 200 mL/hr over 30 Minutes Intravenous Every 8 hours 10/02/22 2150 10/02/22 2151   10/02/22 1730  ceFEPIme (MAXIPIME) 2 g in sodium chloride 0.9 % 100 mL IVPB        2 g 200 mL/hr over 30 Minutes Intravenous  Once 10/02/22 1720 10/02/22 1825   10/02/22 1730  clindamycin (CLEOCIN) capsule 300 mg        300 mg Oral  Once 10/02/22 1720 10/02/22 1751   10/02/22 1730  vancomycin (VANCOREADY) IVPB 1750 mg/350 mL        1,750 mg 175 mL/hr over 120 Minutes Intravenous  Once 10/02/22 1724 10/02/22 2125        MEDICATIONS: Scheduled Meds:  amLODipine  10 mg Oral Daily   arformoterol  15 mcg Nebulization BID   aspirin EC  81 mg Oral Q breakfast   atorvastatin  80 mg Oral QHS   budesonide (PULMICORT) nebulizer solution  0.25 mg Nebulization BID   feeding supplement  237 mL Oral BID BM   feeding supplement (JEVITY 1.5 CAL/FIBER)  1,000 mL Per Tube Q24H   feeding supplement (PROSource TF20)  60 mL Per Tube Daily   free water  200 mL Per Tube Q4H   heparin  5,000 Units Subcutaneous Q8H   insulin aspart  0-15 Units Subcutaneous Q4H   ipratropium-albuterol  3 mL Nebulization BID   levothyroxine  75 mcg Oral QAC breakfast   magnesium oxide  400 mg Oral BID   metoprolol tartrate  100 mg Oral BID   oxyCODONE  5 mg Oral Q4H   pantoprazole (PROTONIX) IV  40 mg Intravenous Q24H   senna-docusate  1 tablet Oral BID   tamsulosin  0.8 mg Oral Daily   thiamine  100 mg Oral Daily    Continuous Infusions: PRN Meds:.alum & mag hydroxide-simeth, HYDROcodone-acetaminophen, labetalol, polyethylene glycol   I have personally reviewed following labs and imaging studies  LABORATORY DATA: CBC: Recent Labs  Lab 10/12/22 0030 10/12/22 0155 10/13/22 0428 10/14/22 0518 10/15/22 0356  WBC 14.4*  15.5* 14.3* 15.9* 15.6*  NEUTROABS 10.0*  --   --   --   --   HGB 9.1* 8.4* 8.2* 8.1* 8.3*  HCT 29.5* 28.5* 26.9* 26.4* 25.9*  MCV 92.8 94.7 94.7 92.6 91.5  PLT 489* 544* 361 313 829    Basic Metabolic Panel: Recent Labs  Lab 10/10/22 0456 10/10/22 1522 10/11/22 0153 10/11/22 1530 10/12/22 0030 10/12/22 0155 10/13/22 0428 10/14/22 0518 10/15/22 0356  NA 152*   < > 151*   < > 147* 147* 139 138 139  K 3.3*   < > 3.5   < > 3.8 3.7 3.8 5.0 4.6  CL 116*   < > 122*   < > 119* 117* 109 112* 107  CO2 20*   < > 21*   < > 20* 22 23 18* 23  GLUCOSE 191*   < > 199*   < > 170* 152* 168* 139* 180*  BUN 29*   < > 31*   < > 28* 27* 29* 29* 28*  CREATININE 1.37*   < > 1.48*   < > 1.26* 1.31* 1.15 1.12 1.18  CALCIUM 9.5   < > 9.2   < > 8.5* 8.9 8.2* 8.5* 9.2  MG 1.7  --  1.8  --  1.7 1.7 1.6* 2.1 2.0  PHOS 2.5  --  2.6  --   --  3.5 3.1 3.6  --    < > = values in this interval not displayed.    GFR: Estimated Creatinine Clearance: 85.2 mL/min (by C-G formula based on SCr of 1.18 mg/dL).  Liver Function Tests: Recent Labs  Lab 10/12/22 0030  AST 25  ALT 19  ALKPHOS 110  BILITOT <0.1*  PROT 6.6  ALBUMIN 2.1*   No results for input(s): "LIPASE", "AMYLASE" in the last 168 hours. No results for input(s): "AMMONIA" in the last 168 hours.  Coagulation Profile: No results for input(s): "INR", "PROTIME" in the last 168 hours.  Cardiac Enzymes: No results for input(s): "CKTOTAL", "CKMB", "CKMBINDEX", "TROPONINI" in the last 168 hours.  BNP (last 3 results) No results for input(s): "PROBNP" in the last 8760 hours.  Lipid Profile: No results for input(s): "CHOL", "HDL",  "LDLCALC", "TRIG", "CHOLHDL", "LDLDIRECT" in the last 72 hours.  Thyroid Function Tests: No results for input(s): "TSH", "T4TOTAL", "FREET4", "T3FREE", "THYROIDAB" in the last 72 hours.  Anemia Panel: No results for input(s): "VITAMINB12", "FOLATE", "FERRITIN", "TIBC", "IRON", "RETICCTPCT" in the last 72 hours.  Urine analysis:    Component Value Date/Time   COLORURINE YELLOW 10/07/2022 0410   APPEARANCEUR CLEAR 10/07/2022 0410   LABSPEC 1.025 10/07/2022 0410   PHURINE 5.0 10/07/2022 0410   GLUCOSEU NEGATIVE 10/07/2022 0410   HGBUR TRACE (A) 10/07/2022 0410   BILIRUBINUR NEGATIVE 10/07/2022 0410   KETONESUR NEGATIVE 10/07/2022 0410   PROTEINUR 100 (A) 10/07/2022 0410   NITRITE NEGATIVE 10/07/2022 0410   LEUKOCYTESUR NEGATIVE 10/07/2022 0410    Sepsis Labs: Lactic Acid, Venous    Component Value Date/Time   LATICACIDVEN 1.2 10/02/2022 2007    MICROBIOLOGY: Recent Results (from the past 240 hour(s))  Culture, blood (Routine X 2) w Reflex to ID Panel     Status: None   Collection Time: 10/09/22  2:23 PM   Specimen: BLOOD RIGHT HAND  Result Value Ref Range Status   Specimen Description BLOOD RIGHT HAND  Final   Special Requests   Final    BOTTLES DRAWN AEROBIC AND ANAEROBIC Blood Culture adequate volume  Culture   Final    NO GROWTH 5 DAYS Performed at [facility], [place] [address], [place], [place] [address]    Report Status 10/14/2022 FINAL  Final  Culture, blood (Routine X 2) w Reflex to ID Panel     Status: None   Collection Time: 10/09/22  2:31 PM   Specimen: BLOOD RIGHT HAND  Result Value Ref Range Status   Specimen Description BLOOD RIGHT HAND  Final   Special Requests   Final    BOTTLES DRAWN AEROBIC AND ANAEROBIC Blood Culture adequate volume   Culture   Final    NO GROWTH 5 DAYS Performed at [facility], [place] [address] [place], [place] [address]    Report Status 10/14/2022 FINAL  Final    RADIOLOGY STUDIES/RESULTS: ECHOCARDIOGRAM  COMPLETE  Result Date: 10/13/2022    ECHOCARDIOGRAM REPORT   Patient Name:   TETSUO COPPOLA Date of Exam: 10/13/2022 Medical Rec #:  [id number]       Height:       69.0 in Accession #:    [id number]      Weight:       260.1 lb Date of Birth:  [birth year]       BSA:          2.310 m Patient Age:    50 years        BP:           149/66 mmHg Patient Gender: M               HR:           68 bpm. Exam Location:  Inpatient Procedure: 2D Echo, Cardiac Doppler and Color Doppler Indications:    Ventricular Tachycardia I47.2  History:        Patient has prior history of Echocardiogram examinations, most                 recent 08/03/2020. Cardiomyopathy and CHF, CAD, Prior CABG,                 Signs/Symptoms:Shortness of Breath; Risk Factors:Hypertension,                 Sleep Apnea, Diabetes and Dyslipidemia. Prior CABG 03/13/20.  Sonographer:    Ronny Flurry Referring Phys: [place]  1. Left ventricular ejection fraction, by estimation, is 55 to 60%. The left ventricle has normal function. The left ventricle has no regional wall motion abnormalities. There is moderate concentric left ventricular hypertrophy. Left ventricular diastolic parameters are indeterminate.  2. Right ventricular systolic function is normal. The right ventricular size is normal.  3. The mitral valve is normal in structure. Mild mitral valve regurgitation.  4. AV is thickened, calcified. Difficult to see well Peak and mean gradients through the valve are 21 and 12 mm Hg respectively. AVA (VTI) is 1.8 cm2. Dimensionless index is 0.5 consistent with mild AS.Marland Kitchen The aortic valve was not well visualized. Aortic valve regurgitation is not visualized. FINDINGS  Left Ventricle: Left ventricular ejection fraction, by estimation, is 55 to 60%. The left ventricle has normal function. The left ventricle has no regional wall motion abnormalities. The left ventricular internal cavity size was normal in size. There is  moderate concentric  left ventricular hypertrophy. Left ventricular diastolic parameters are indeterminate. Right Ventricle: The right ventricular size is normal. Right vetricular wall thickness was not assessed. Right ventricular systolic function is normal. Left Atrium: Left atrial size was  normal in size. Right Atrium: Right atrial size was normal in size. Pericardium: There is no evidence of pericardial effusion. Mitral Valve: The mitral valve is normal in structure. Mild mitral valve regurgitation. Tricuspid Valve: The tricuspid valve is normal in structure. Tricuspid valve regurgitation is mild. Aortic Valve: AV is thickened, calcified. Difficult to see well Peak and mean gradients through the valve are 21 and 12 mm Hg respectively. AVA (VTI) is 1.8 cm2. Dimensionless index is 0.5 consistent with mild AS. The aortic valve was not well visualized. Aortic valve regurgitation is not visualized. Aortic valve mean gradient measures 10.5 mmHg. Aortic valve peak gradient measures 18.1 mmHg. Aortic valve area, by VTI measures 2.07 cm. Pulmonic Valve: The pulmonic valve was normal in structure. Pulmonic valve regurgitation is not visualized. Aorta: The aortic root and ascending aorta are structurally normal, with no evidence of dilitation. IAS/Shunts: No atrial level shunt detected by color flow Doppler.  LEFT VENTRICLE PLAX 2D LVIDd:         5.10 cm   Diastology LVIDs:         3.60 cm   LV e' medial:    4.01 cm/s LV PW:         1.50 cm   LV E/e' medial:  30.1 LV IVS:        1.40 cm   LV e' lateral:   6.89 cm/s LVOT diam:     2.30 cm   LV E/e' lateral: 17.6 LV SV:         96 LV SV Index:   42 LVOT Area:     4.15 cm  RIGHT VENTRICLE            IVC RV S prime:     8.45 cm/s  IVC diam: 1.80 cm TAPSE (M-mode): 1.4 cm LEFT ATRIUM             Index        RIGHT ATRIUM           Index LA diam:        4.90 cm 2.12 cm/m   RA Area:     12.70 cm LA Vol (A2C):   72.7 ml 31.47 ml/m  RA Volume:   25.40 ml  10.99 ml/m LA Vol (A4C):   65.9 ml 28.53  ml/m LA Biplane Vol: 69.6 ml 30.13 ml/m  AORTIC VALVE AV Area (Vmax):    2.01 cm AV Area (Vmean):   1.90 cm AV Area (VTI):     2.07 cm AV Vmax:           213.00 cm/s AV Vmean:          151.500 cm/s AV VTI:            0.464 m AV Peak Grad:      18.1 mmHg AV Mean Grad:      10.5 mmHg LVOT Vmax:         103.00 cm/s LVOT Vmean:        69.400 cm/s LVOT VTI:          0.231 m LVOT/AV VTI ratio: 0.50  AORTA Ao Root diam: 3.30 cm Ao Asc diam:  3.60 cm MITRAL VALVE MV Area (PHT): 4.31 cm     SHUNTS MV Decel Time: 176 msec     Systemic VTI:  0.23 m MV E velocity: 121.00 cm/s  Systemic Diam: 2.30 cm MV A velocity: 62.70 cm/s MV E/A ratio:  1.93 Dorris Carnes MD Electronically signed by Dorris Carnes  MD Signature Date/Time: 10/13/2022/2:37:40 PM    Final      LOS: 13 days   Oren Binet, MD  [facility]    To contact the attending provider between 7A-7P or the covering provider during after hours 7P-7A, please log into the web site [email or web address] and access using universal [place] password for that web site. If you do not have the password, please call the hospital operator.  10/15/2022, 8:50 AM

## 2022-10-15 NOTE — Progress Notes (Signed)
Physical Therapy Treatment Patient Details Name: Shane Chambers MRN: [id number] DOB: November 04, 1961 Today's Date: 10/15/2022   History of Present Illness Pt is a 61 yo male admitted after recent hospitalization for cellulitis from 11/16-11/22. Pt sent home with antibiotics but returned with leg pain.  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 CT scan with no acute abnomality.  PMH: DM, CAD, CABG, COPD, HTN, PVD, AKI.    PT Comments    Pt demonstrates significantly impaired sitting balance at this time, with strong left lateral lean. PT provides max tactile, verbal and visual cues to aide in improving balance, but pt continues to require minA. Due to L lean and weakness PT is unable to assist pt in lateral scoots at this time. Pt will benefit from continued acute PT services in an effort to reduce falls risk and caregiver burden. PT recommends return to SNF when medically ready.   Recommendations for follow up therapy are one component of a multi-disciplinary discharge planning process, led by the attending physician.  Recommendations may be updated based on patient status, additional functional criteria and insurance authorization.  Follow Up Recommendations  Skilled nursing-short term rehab (<3 hours/day) Can patient physically be transported by private vehicle: No   Assistance Recommended at Discharge Frequent or constant Supervision/Assistance  Patient can return home with the following Two people to help with walking and/or transfers;Two people to help with bathing/dressing/bathroom;Direct supervision/assist for medications management;Assist for transportation;Direct supervision/assist for financial management;Help with stairs or ramp for entrance   Equipment Recommendations  None recommended by PT    Recommendations for Other Services       Precautions / Restrictions Precautions Precautions: Fall Required Braces or Orthoses: Other  Brace Splint/Cast: For R BKA Other Brace: limb protector R residual limb Restrictions Weight Bearing Restrictions: Yes RLE Weight Bearing: Non weight bearing     Mobility  Bed Mobility Overal bed mobility: Needs Assistance Bed Mobility: Supine to Sit, Sit to Supine     Supine to sit: Max assist, HOB elevated Sit to supine: Mod assist, HOB elevated        Transfers Overall transfer level:  (attempt to initiate sit to squat or lateral scoots but unable to clear buttocks. Poor sitting balance and pt anxiety contributing)                      Ambulation/Gait                   Stairs             Wheelchair Mobility    Modified Rankin (Stroke Patients Only)       Balance Overall balance assessment: Needs assistance Sitting-balance support: Feet supported, Single extremity supported, Bilateral upper extremity supported Sitting balance-Leahy Scale: Poor Sitting balance - Comments: mod-maxA initially, minA with increased time sitting and external visual cues to achieve more cenetered positioning Postural control: Left lateral lean                                  Cognition Arousal/Alertness: Awake/alert Behavior During Therapy: WFL for tasks assessed/performed Overall Cognitive Status: Difficult to assess Area of Impairment: Orientation, Attention, Memory, Following commands, Safety/judgement, Problem solving, Awareness                 Orientation Level: Disoriented to, Time Current Attention Level: Sustained Memory: Decreased recall  of precautions, Decreased short-term memory Following Commands: Follows one step commands with increased time, Follows multi-step commands inconsistently Safety/Judgement: Decreased awareness of safety, Decreased awareness of deficits Awareness: Intellectual Problem Solving: Slow processing, Decreased initiation, Requires tactile cues, Requires verbal cues          Exercises      General  Comments General comments (skin integrity, edema, etc.): VSS on 3L [place]      Pertinent Vitals/Pain Pain Assessment Pain Assessment: Faces Faces Pain Scale: Hurts even more Pain Location: LUE Pain Descriptors / Indicators: Sharp Pain Intervention(s): Monitored during session    Home Living                          Prior Function            PT Goals (current goals can now be found in the care plan section) Acute Rehab PT Goals Patient Stated Goal: rehab Progress towards PT goals: Not progressing toward goals - comment (poor balance)    Frequency    Min 2X/week      PT Plan Frequency needs to be updated    Co-evaluation              AM-PAC PT "6 Clicks" Mobility   Outcome Measure  Help needed turning from your back to your side while in a flat bed without using bedrails?: A Lot Help needed moving from lying on your back to sitting on the side of a flat bed without using bedrails?: A Lot Help needed moving to and from a bed to a chair (including a wheelchair)?: Total Help needed standing up from a chair using your arms (e.g., wheelchair or bedside chair)?: Total Help needed to walk in hospital room?: Total Help needed climbing 3-5 steps with a railing? : Total 6 Click Score: 8    End of Session Equipment Utilized During Treatment: Oxygen Activity Tolerance: Patient limited by fatigue Patient left: in bed;with call bell/phone within reach;with bed alarm set Nurse Communication: Mobility status;Need for lift equipment PT Visit Diagnosis: Muscle weakness (generalized) (M62.81);Difficulty in walking, not elsewhere classified (R26.2);Other abnormalities of gait and mobility (R26.89)     Time: 9604-5409 PT Time Calculation (min) (ACUTE ONLY): 25 min  Charges:  $Therapeutic Activity: 23-37 mins                     Zenaida Niece, PT, DPT Acute Rehabilitation Office [phone]    Zenaida Niece 10/15/2022, 11:05 AM

## 2022-10-15 NOTE — Progress Notes (Signed)
Occupational Therapy Treatment Patient Details Name: Shane Chambers MRN: [id number] DOB: 05-02-1961 Today's Date: 10/15/2022   History of present illness Pt is a 61 yo male admitted after recent hospitalization for cellulitis from 11/16-11/22. Pt sent home with antibiotics but returned with leg pain.  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 CT scan with no acute abnomality.  PMH: DM, CAD, CABG, COPD, HTN, PVD, AKI.   OT comments  Pt in bed upon therapy arrival sleeping. Able to wake up with verbal stim. Lunch sitting on bedside try untouched. Pt declined eating lunch with therapy assisting. Session focused on BUE strength and ROM. Pt with difficulty maintaining alertness in order to fully participate in session. Unable to keep eyes open for an extended amount of time even with VC and physical cues to stay on task and participate. Session ended early due to level of consciousness. Pt will benefit from UE ROM exercises when seated on EOB if able at next session. OT will continue to follow pt acutely.    Recommendations for follow up therapy are one component of a multi-disciplinary discharge planning process, led by the attending physician.  Recommendations may be updated based on patient status, additional functional criteria and insurance authorization.    Follow Up Recommendations  Skilled nursing-short term rehab (<3 hours/day)     Assistance Recommended at Discharge Frequent or constant Supervision/Assistance  Patient can return home with the following  Two people to help with walking and/or transfers;Two people to help with bathing/dressing/bathroom;Assistance with cooking/housework;Direct supervision/assist for medications management;Assist for transportation;Help with stairs or ramp for entrance;Direct supervision/assist for financial management   Equipment Recommendations  None recommended by OT       Precautions / Restrictions  Precautions Precautions: Fall Required Braces or Orthoses: Other Brace Splint/Cast: For R BKA Other Brace: limb protector R residual limb Restrictions Weight Bearing Restrictions: Yes RLE Weight Bearing: Non weight bearing              ADL either performed or assessed with clinical judgement    Extremity/Trunk Assessment Upper Extremity Assessment Upper Extremity Assessment: LUE deficits/detail LUE Deficits / Details: Pt reports pain in elbow with passive flexion/extension. Unable to tolerate full pasive ROM.                      Cognition Arousal/Alertness: Lethargic Behavior During Therapy: Flat affect Overall Cognitive Status: No family/caregiver present to determine baseline cognitive functioning Area of Impairment: Following commands, Awareness, Memory       Memory: Decreased short-term memory       Problem Solving: Slow processing, Decreased initiation, Requires tactile cues, Requires verbal cues, Difficulty sequencing          Exercises General Exercises - Upper Extremity Shoulder Flexion: Right, AAROM, Left, PROM, 5 reps, Supine Elbow Flexion: Right, AAROM, 5 reps, Supine Elbow Extension: AAROM, Right, 5 reps, Supine Wrist Flexion: Left, PROM, 5 reps, Supine Wrist Extension: PROM, Left, 5 reps, Supine Composite Extension: PROM, Left, 5 reps, Supine            Pertinent Vitals/ Pain       Pain Assessment Pain Assessment: Faces Faces Pain Scale: Hurts little more Pain Location: left elbow with mobility Pain Descriptors / Indicators: Grimacing, Discomfort Pain Intervention(s): Monitored during session, Limited activity within patient's tolerance         Frequency  Min 2X/week        Progress Toward Goals  OT Goals(current goals can now be found in the care plan section)  Progress towards OT goals: Not progressing toward goals - comment (lethargy limiting pt's participation this session)     Plan Discharge plan remains  appropriate;Frequency remains appropriate       AM-PAC OT "6 Clicks" Daily Activity     Outcome Measure   Help from another person eating meals?: Total Help from another person taking care of personal grooming?: Total Help from another person toileting, which includes using toliet, bedpan, or urinal?: Total Help from another person bathing (including washing, rinsing, drying)?: Total Help from another person to put on and taking off regular upper body clothing?: Total Help from another person to put on and taking off regular lower body clothing?: Total 6 Click Score: 6    End of Session    OT Visit Diagnosis: Unsteadiness on feet (R26.81);Muscle weakness (generalized) (M62.81)   Activity Tolerance Patient limited by lethargy;Patient limited by fatigue   Patient Left in bed;with call bell/phone within reach;with bed alarm set           Time: 1355-1411 OT Time Calculation (min): 16 min  Charges: OT General Charges $OT Visit: 1 Visit OT Treatments $Therapeutic Exercise: 8-22 mins  Ailene Ravel, OTR/L,CBIS  Supplemental OT - [facility] and [facility]   Yeily Link, Clarene Duke 10/15/2022, 2:21 PM

## 2022-10-16 DIAGNOSIS — N179 Acute kidney failure, unspecified: Secondary | ICD-10-CM | POA: Diagnosis not present

## 2022-10-16 DIAGNOSIS — I10 Essential (primary) hypertension: Secondary | ICD-10-CM | POA: Diagnosis not present

## 2022-10-16 DIAGNOSIS — E8809 Other disorders of plasma-protein metabolism, not elsewhere classified: Secondary | ICD-10-CM | POA: Diagnosis not present

## 2022-10-16 DIAGNOSIS — M86271 Subacute osteomyelitis, right ankle and foot: Secondary | ICD-10-CM | POA: Diagnosis not present

## 2022-10-16 LAB — GLUCOSE, CAPILLARY
Glucose-Capillary: 162 mg/dL — ABNORMAL HIGH (ref 70–99)
Glucose-Capillary: 193 mg/dL — ABNORMAL HIGH (ref 70–99)
Glucose-Capillary: 158 mg/dL — ABNORMAL HIGH (ref 70–99)
Glucose-Capillary: 167 mg/dL — ABNORMAL HIGH (ref 70–99)
Glucose-Capillary: 205 mg/dL — ABNORMAL HIGH (ref 70–99)

## 2022-10-16 LAB — BASIC METABOLIC PANEL
Anion gap: 8 (ref 5–15)
BUN: 28 mg/dL — ABNORMAL HIGH (ref 6–20)
CO2: 24 mmol/L (ref 22–32)
Calcium: 8.7 mg/dL — ABNORMAL LOW (ref 8.9–10.3)
Chloride: 107 mmol/L (ref 98–111)
Creatinine, Ser: 1.17 mg/dL (ref 0.61–1.24)
GFR, Estimated: >60 mL/min (ref >60)
Glucose, Bld: 191 mg/dL — ABNORMAL HIGH (ref 70–99)
Potassium: 4.8 mmol/L (ref 3.5–5.1)
Sodium: 139 mmol/L (ref 135–145)

## 2022-10-16 LAB — CBC
HCT: 22.3 % — ABNORMAL LOW (ref 39.0–52.0)
Hemoglobin: 6.9 g/dL — CL (ref 13.0–17.0)
MCH: 28.8 pg (ref 26.0–34.0)
MCHC: 30.9 g/dL (ref 30.0–36.0)
MCV: 92.9 fL (ref 80.0–100.0)
Platelets: 312 10*3/uL (ref 150–400)
RBC: 2.4 MIL/uL — ABNORMAL LOW (ref 4.22–5.81)
RDW: 16.1 % — ABNORMAL HIGH (ref 11.5–15.5)
WBC: 12.7 10*3/uL — ABNORMAL HIGH (ref 4.0–10.5)
nRBC: 0 % (ref 0.0–0.2)

## 2022-10-16 LAB — MAGNESIUM: Magnesium: 1.9 mg/dL (ref 1.7–2.4)

## 2022-10-16 LAB — PREPARE RBC (CROSSMATCH)

## 2022-10-16 MED ORDER — OXYCODONE HCL 5 MG PO TABS
5.0000 mg | ORAL_TABLET | ORAL | Status: DC | PRN
  Administered 2022-10-16 – 2022-10-17 (×3): 5 mg via ORAL
  Filled 2022-10-16 (×3): qty 1

## 2022-10-16 MED ORDER — IPRATROPIUM-ALBUTEROL 0.5-2.5 (3) MG/3ML IN SOLN: 3.0000 mL | Freq: Four times a day (QID) | RESPIRATORY_TRACT | Status: DC | PRN

## 2022-10-16 MED ORDER — MAGNESIUM SULFATE IN D5W 1-5 GM/100ML-% IV SOLN
1.0000 g | Freq: Once | INTRAVENOUS | Status: AC
  Administered 2022-10-16: 1 g via INTRAVENOUS
  Filled 2022-10-16: qty 100

## 2022-10-16 MED ORDER — SODIUM CHLORIDE 0.9% IV SOLUTION: Freq: Once | INTRAVENOUS | Status: AC

## 2022-10-16 NOTE — Progress Notes (Signed)
   10/16/22 1500  Clinical Encounter Type  Visited With Patient  Visit Type Initial;Follow-up  Referral From Nurse;Patient  Consult/Referral To Chaplain  Spiritual Encounters  Spiritual Needs Emotional   Chaplain spoke with patient on 10/15/22 who are groggy from medication. On 10/16/22, Chaplain attempted to see patient and was able to speak with nurse. Patient has been on medication which affects his ability to be alert. As such, Chaplain was unable to speak with patient.   Melody Haver, Resident Chaplain [phone]

## 2022-10-16 NOTE — Progress Notes (Signed)
PROGRESS NOTE        PATIENT DETAILS Name: Shane Chambers Age: 61 y.o. Sex: male Date of Birth: 1961-09-23 Admit Date: 10/02/2022 Admitting Physician Rolla Plate, DO GUY:[id number], Fara Olden, MD  Brief Summary: Patient is a 61 y.o.  male with history of CAD s/p CABG, COPD, HTN, DM-2, PAD who was just hospitalized from 11/16-11/22 for cellulitis of right leg-presented with worsening right leg pain-upon further evaluation-he was found to have severe soft tissue infection of the right leg with septic right ankle and osteomyelitis.  Patient was evaluated by orthopedics-underwent BKA-subsequently hospital course complicated by severe encephalopathy, and torsades de points.  Significant events: 11/16-11/22>> hospitalization for RLE cellulitis-discharged on oral antibiotics. 11/23>> admit to [facility] cellulitis-osteomyelitis/septic arthritis. 11/26>> BKA 12/02>> torsades de points-spontaneously converted to sinus rhythm.  Significant studies: 11/23>> CT right leg: Severe cellulitis/probable myofascitis-multiple complex fluid collections around the ankle-erosive changes along the deep cortex of the fibula, medial malleolus-suspicious for osteomyelitis. 11/24>> MRI right leg: Suspicious for right ankle septic arthritis with periarticular osteomyelitis.  Moderate-sized tibiotalar joint effusion. 11/26>> renal ultrasound: Normal 11/29>> CT head: No acute intracranial abnormality. 11/30>> hazy airspace opacity left upper lobe. 12/04>> echo: EF 55-60%  Significant microbiology data: 11/30>> blood culture: Negative  Procedures: 11/26>> BKA Sharol Given) 12/01>>[place]   Consults: Orthopedics Psychiatry Nephrology Cardiology  Subjective: No issues overnight-lying comfortably in bed.  Objective: Vitals: Blood pressure 134/64, pulse 63, temperature 97.6 F (36.4 C), temperature source Axillary, resp. rate 16, height 5' 9.02" (1.753 m), weight 117.5 kg, SpO2  100 %.   Exam: Gen Exam:Alert awake-not in any distress HEENT:atraumatic, normocephalic Chest: B/L clear to auscultation anteriorly CVS:S1S2 regular Abdomen:soft non tender, non distended Extremities: S/p right BKA Neurology: Non focal Skin: no rash  Pertinent Labs/Radiology:    Latest Ref Rng & Units 10/16/2022    6:30 AM 10/15/2022    3:56 AM 10/14/2022    5:18 AM  CBC  WBC 4.0 - 10.5 K/uL 12.7  15.6  15.9   Hemoglobin 13.0 - 17.0 g/dL 6.9  8.3  8.1   Hematocrit 39.0 - 52.0 % 22.3  25.9  26.4   Platelets 150 - 400 K/uL 312  327  313     Lab Results  Component Value Date   NA 139 10/16/2022   K 4.8 10/16/2022   CL 107 10/16/2022   CO2 24 10/16/2022     Assessment/Plan: Right lower extremity severe soft tissue infection with acute osteomyelitis right ankle septic arthritis Evaluated by orthopedics-s/p BKA on 11/26 Completed course of antibiotics Defer further to orthopedics Mobilize with PT/OT  SIRS Aspiration PNA Afebrile Suspected may have had an aspiration event when he had torsades/delirium Completed course of antibiotics Maintain aspiration precautions.  Torsades de points on 12/02 QTc elongation due to antipsychotics QTc much better as of EKG on 12/6 Keep K>4, Mg>2 Telemetry monitoring Avoid QTc prolonging agents  AKI on CKD stage IIIb Hemodynamically mediated Resolved-creatinine now at baseline.  Acute metabolic encephalopathy Due to AKI/RLE soft tissue infection Some suspicion for underlying dementia Significantly improved-awake/alert answering all questions appropriately Supportive care Continue delirium precautions.  Normocytic anemia due to CKD Acute blood loss anemia-s/p RLE BKA Anemia of critical illness Drop in hemoglobin today-suspect this is due to acute illness/CKD-no evidence of blood loss this morning Transfuse 1 additional unit of PRBC today Follow CBC  Hypernatremia Resolved  CAD s/p CABG No anginal  symptoms Aspirin/statin/beta-blocker  Chronic HFpEF Euvolemic Not on diuretics-resume when able  HTN Stable on beta-blocker  DM2 (A1c 5.6 on 11/17) Stable on SSI  Recent Labs    10/15/22 2345 10/16/22 0401 10/16/22 0745  GLUCAP 183* 162* 193*     Hypothyroidism Levothyroxine  BPH Flomax  Depression Currently off all antidepressant medications given torsade/QTc prolongation Received secure text from Priscille Loveless Perry-FNP-with psychiatry on 12/7-recommendations are to start Remeron in the outpatient setting.  No additional recommendations.  Debility/deconditioning/functional quadriplegia SNF planned on discharge.  Nutrition Status: Nutrition Problem: Increased nutrient needs Etiology: wound healing Signs/Symptoms: estimated needs Interventions: Ensure Enlive (each supplement provides 350kcal and 20 grams of protein), Tube feeding Now on nocturnal tube feeds-if has adequate oral intake during daytime-suspect could remove NG tube.  Pressure Ulcer: Pressure Injury 10/03/22 Buttocks Stage 1 -  Intact skin with non-blanchable redness of a localized area usually over a bony prominence. (Active)  10/03/22 0220  Location: Buttocks  Location Orientation:   Staging: Stage 1 -  Intact skin with non-blanchable redness of a localized area usually over a bony prominence.  Wound Description (Comments):   Present on Admission: Yes  Dressing Type Foam - Lift dressing to assess site every shift 10/15/22 1919    Morbid Obesity: Estimated body mass index is 38.24 kg/m as calculated from the following:   Height as of this encounter: 5' 9.02" (1.753 m).   Weight as of this encounter: 117.5 kg.   Code status:   Code Status: Full Code   DVT Prophylaxis: heparin injection 5,000 Units Start: 10/03/22 0600 SCDs Start: 10/03/22 0127   Family Communication: None at bedside   Disposition Plan: Status is: Inpatient Remains inpatient appropriate because: severity of illness    Planned Discharge Destination:Skilled nursing facility   Diet: Diet Order             Diet regular Room service appropriate? Yes; Fluid consistency: Thin  Diet effective now                     Antimicrobial agents: Anti-infectives (From admission, onward)    Start     Dose/Rate Route Frequency Ordered Stop   10/10/22 0815  cefTRIAXone (ROCEPHIN) 2 g in sodium chloride 0.9 % 100 mL IVPB        2 g 200 mL/hr over 30 Minutes Intravenous Every 24 hours 10/10/22 0716 10/14/22 0936   10/10/22 0815  metroNIDAZOLE (FLAGYL) IVPB 500 mg  Status:  Discontinued        500 mg 100 mL/hr over 60 Minutes Intravenous Every 12 hours 10/10/22 0716 10/12/22 1345   10/07/22 2200  cefTRIAXone (ROCEPHIN) 2 g in sodium chloride 0.9 % 100 mL IVPB        2 g 200 mL/hr over 30 Minutes Intravenous Every 24 hours 10/07/22 1005 10/08/22 2252   10/07/22 0845  ceFEPIme (MAXIPIME) 2 g in sodium chloride 0.9 % 100 mL IVPB  Status:  Discontinued        2 g 200 mL/hr over 30 Minutes Intravenous Every 12 hours 10/07/22 0745 10/07/22 1004   10/07/22 0845  vancomycin (VANCOREADY) IVPB 1250 mg/250 mL        1,250 mg 166.7 mL/hr over 90 Minutes Intravenous Every 24 hours 10/07/22 0747 10/08/22 0954   10/05/22 1000  vancomycin variable dose per unstable renal function (pharmacist dosing)  Status:  Discontinued         Does not apply See admin  instructions 10/05/22 0937 10/05/22 1915   10/05/22 0956  ceFAZolin (ANCEF) 3-0.9 GM/100ML-% IVPB       Note to Pharmacy: Nyoka Cowden D: cabinet override      10/05/22 0956 10/05/22 2159   10/05/22 0600  ceFAZolin (ANCEF) IVPB 3g/100 mL premix  Status:  Discontinued        3 g 200 mL/hr over 30 Minutes Intravenous On call to O.R. 10/04/22 2058 10/04/22 2116   10/03/22 2200  vancomycin (VANCOREADY) IVPB 1250 mg/250 mL  Status:  Discontinued        1,250 mg 166.7 mL/hr over 90 Minutes Intravenous Every 24 hours 10/03/22 1104 10/05/22 0937   10/03/22 1000  cefTRIAXone  (ROCEPHIN) 2 g in sodium chloride 0.9 % 100 mL IVPB  Status:  Discontinued        2 g 200 mL/hr over 30 Minutes Intravenous Every 24 hours 10/02/22 2140 10/02/22 2144   10/03/22 0500  vancomycin (VANCOREADY) IVPB 750 mg/150 mL  Status:  Discontinued        750 mg 150 mL/hr over 60 Minutes Intravenous Every 12 hours 10/02/22 1731 10/03/22 1104   10/03/22 0500  ceFEPIme (MAXIPIME) 2 g in sodium chloride 0.9 % 100 mL IVPB        2 g 200 mL/hr over 30 Minutes Intravenous Every 12 hours 10/02/22 2151 10/06/22 1800   10/03/22 0100  ceFEPIme (MAXIPIME) 2 g in sodium chloride 0.9 % 100 mL IVPB  Status:  Discontinued        2 g 200 mL/hr over 30 Minutes Intravenous Every 8 hours 10/02/22 2150 10/02/22 2151   10/02/22 1730  ceFEPIme (MAXIPIME) 2 g in sodium chloride 0.9 % 100 mL IVPB        2 g 200 mL/hr over 30 Minutes Intravenous  Once 10/02/22 1720 10/02/22 1825   10/02/22 1730  clindamycin (CLEOCIN) capsule 300 mg        300 mg Oral  Once 10/02/22 1720 10/02/22 1751   10/02/22 1730  vancomycin (VANCOREADY) IVPB 1750 mg/350 mL        1,750 mg 175 mL/hr over 120 Minutes Intravenous  Once 10/02/22 1724 10/02/22 2125        MEDICATIONS: Scheduled Meds:  sodium chloride   Intravenous Once   amLODipine  10 mg Oral Daily   arformoterol  15 mcg Nebulization BID   aspirin EC  81 mg Oral Q breakfast   atorvastatin  80 mg Oral QHS   budesonide (PULMICORT) nebulizer solution  0.25 mg Nebulization BID   feeding supplement  237 mL Oral BID BM   feeding supplement (JEVITY 1.5 CAL/FIBER)  1,000 mL Per Tube Q24H   feeding supplement (PROSource TF20)  60 mL Per Tube Daily   free water  200 mL Per Tube Q4H   heparin  5,000 Units Subcutaneous Q8H   insulin aspart  0-15 Units Subcutaneous Q4H   ipratropium-albuterol  3 mL Nebulization BID   levothyroxine  75 mcg Oral QAC breakfast   magnesium oxide  400 mg Oral BID   metoprolol tartrate  100 mg Oral BID   oxyCODONE  5 mg Oral Q4H   pantoprazole  (PROTONIX) IV  40 mg Intravenous Q24H   senna-docusate  1 tablet Oral BID   tamsulosin  0.8 mg Oral Daily   thiamine  100 mg Oral Daily   Continuous Infusions: PRN Meds:.alum & mag hydroxide-simeth, HYDROcodone-acetaminophen, labetalol, polyethylene glycol   I have personally reviewed following labs and imaging studies  LABORATORY  DATA: CBC: Recent Labs  Lab 10/12/22 0030 10/12/22 0155 10/13/22 0428 10/14/22 0518 10/15/22 0356 10/16/22 0630  WBC 14.4* 15.5* 14.3* 15.9* 15.6* 12.7*  NEUTROABS 10.0*  --   --   --   --   --   HGB 9.1* 8.4* 8.2* 8.1* 8.3* 6.9*  HCT 29.5* 28.5* 26.9* 26.4* 25.9* 22.3*  MCV 92.8 94.7 94.7 92.6 91.5 92.9  PLT 489* 544* 361 313 327 312     Basic Metabolic Panel: Recent Labs  Lab 10/10/22 0456 10/10/22 1522 10/11/22 0153 10/11/22 1530 10/12/22 0155 10/13/22 0428 10/14/22 0518 10/15/22 0356 10/16/22 0630  NA 152*   < > 151*   < > 147* 139 138 139 139  K 3.3*   < > 3.5   < > 3.7 3.8 5.0 4.6 4.8  CL 116*   < > 122*   < > 117* 109 112* 107 107  CO2 20*   < > 21*   < > 22 23 18* 23 24  GLUCOSE 191*   < > 199*   < > 152* 168* 139* 180* 191*  BUN 29*   < > 31*   < > 27* 29* 29* 28* 28*  CREATININE 1.37*   < > 1.48*   < > 1.31* 1.15 1.12 1.18 1.17  CALCIUM 9.5   < > 9.2   < > 8.9 8.2* 8.5* 9.2 8.7*  MG 1.7  --  1.8   < > 1.7 1.6* 2.1 2.0 1.9  PHOS 2.5  --  2.6  --  3.5 3.1 3.6  --   --    < > = values in this interval not displayed.     GFR: Estimated Creatinine Clearance: 86 mL/min (by C-G formula based on SCr of 1.17 mg/dL).  Liver Function Tests: Recent Labs  Lab 10/12/22 0030  AST 25  ALT 19  ALKPHOS 110  BILITOT <0.1*  PROT 6.6  ALBUMIN 2.1*    No results for input(s): "LIPASE", "AMYLASE" in the last 168 hours. No results for input(s): "AMMONIA" in the last 168 hours.  Coagulation Profile: No results for input(s): "INR", "PROTIME" in the last 168 hours.  Cardiac Enzymes: No results for input(s): "CKTOTAL", "CKMB",  "CKMBINDEX", "TROPONINI" in the last 168 hours.  BNP (last 3 results) No results for input(s): "PROBNP" in the last 8760 hours.  Lipid Profile: No results for input(s): "CHOL", "HDL", "LDLCALC", "TRIG", "CHOLHDL", "LDLDIRECT" in the last 72 hours.  Thyroid Function Tests: No results for input(s): "TSH", "T4TOTAL", "FREET4", "T3FREE", "THYROIDAB" in the last 72 hours.  Anemia Panel: No results for input(s): "VITAMINB12", "FOLATE", "FERRITIN", "TIBC", "IRON", "RETICCTPCT" in the last 72 hours.  Urine analysis:    Component Value Date/Time   COLORURINE YELLOW 10/07/2022 0410   APPEARANCEUR CLEAR 10/07/2022 0410   LABSPEC 1.025 10/07/2022 0410   PHURINE 5.0 10/07/2022 0410   GLUCOSEU NEGATIVE 10/07/2022 0410   HGBUR TRACE (A) 10/07/2022 0410   BILIRUBINUR NEGATIVE 10/07/2022 0410   KETONESUR NEGATIVE 10/07/2022 0410   PROTEINUR 100 (A) 10/07/2022 0410   NITRITE NEGATIVE 10/07/2022 0410   LEUKOCYTESUR NEGATIVE 10/07/2022 0410    Sepsis Labs: Lactic Acid, Venous    Component Value Date/Time   LATICACIDVEN 1.2 10/02/2022 2007    MICROBIOLOGY: Recent Results (from the past 240 hour(s))  Culture, blood (Routine X 2) w Reflex to ID Panel     Status: None   Collection Time: 10/09/22  2:23 PM   Specimen: BLOOD RIGHT HAND  Result Value  Ref Range Status   Specimen Description BLOOD RIGHT HAND  Final   Special Requests   Final    BOTTLES DRAWN AEROBIC AND ANAEROBIC Blood Culture adequate volume   Culture   Final    NO GROWTH 5 DAYS Performed at [facility], [name] [place], [place] [address]    Report Status 10/14/2022 FINAL  Final  Culture, blood (Routine X 2) w Reflex to ID Panel     Status: None   Collection Time: 10/09/22  2:31 PM   Specimen: BLOOD RIGHT HAND  Result Value Ref Range Status   Specimen Description BLOOD RIGHT HAND  Final   Special Requests   Final    BOTTLES DRAWN AEROBIC AND ANAEROBIC Blood Culture adequate volume   Culture   Final    NO  GROWTH 5 DAYS Performed at [facility], [place] [address], [place], [place] [address]    Report Status 10/14/2022 FINAL  Final    RADIOLOGY STUDIES/RESULTS: No results found.   LOS: 14 days   Oren Binet, MD  [facility]    To contact the attending provider between 7A-7P or the covering provider during after hours 7P-7A, please log into the web site [email or web address] and access using universal  password for that web site. If you do not have the password, please call the hospital operator.  10/16/2022, 10:39 AM

## 2022-10-17 LAB — CBC
HCT: 28.3 % — ABNORMAL LOW (ref 39.0–52.0)
Hemoglobin: 8.7 g/dL — ABNORMAL LOW (ref 13.0–17.0)
MCH: 28.6 pg (ref 26.0–34.0)
MCHC: 30.7 g/dL (ref 30.0–36.0)
MCV: 93.1 fL (ref 80.0–100.0)
Platelets: 330 10*3/uL (ref 150–400)
RBC: 3.04 MIL/uL — ABNORMAL LOW (ref 4.22–5.81)
RDW: 16.8 % — ABNORMAL HIGH (ref 11.5–15.5)
WBC: 11.1 10*3/uL — ABNORMAL HIGH (ref 4.0–10.5)
nRBC: 0 % (ref 0.0–0.2)

## 2022-10-17 LAB — GLUCOSE, CAPILLARY
Glucose-Capillary: 149 mg/dL — ABNORMAL HIGH (ref 70–99)
Glucose-Capillary: 166 mg/dL — ABNORMAL HIGH (ref 70–99)
Glucose-Capillary: 168 mg/dL — ABNORMAL HIGH (ref 70–99)
Glucose-Capillary: 178 mg/dL — ABNORMAL HIGH (ref 70–99)
Glucose-Capillary: 202 mg/dL — ABNORMAL HIGH (ref 70–99)

## 2022-10-17 LAB — TYPE AND SCREEN
ABO/RH(D): A POS
Antibody Screen: NEGATIVE
Unit division: 0

## 2022-10-17 LAB — BPAM RBC
Blood Product Expiration Date: 202312282359
ISSUE DATE / TIME: 202312071337
Unit Type and Rh: 6200

## 2022-10-17 MED ORDER — IPRATROPIUM-ALBUTEROL 0.5-2.5 (3) MG/3ML IN SOLN: 3.0000 mL | Freq: Four times a day (QID) | RESPIRATORY_TRACT | Status: DC | PRN

## 2022-10-17 MED ORDER — NOVOLOG FLEXPEN 100 UNIT/ML ~~LOC~~ SOPN: PEN_INJECTOR | SUBCUTANEOUS | 0 refills | Status: DC

## 2022-10-17 MED ORDER — VITAMIN B-1 100 MG PO TABS: 100.0000 mg | ORAL_TABLET | Freq: Every day | ORAL | Status: AC

## 2022-10-17 MED ORDER — TORSEMIDE 20 MG PO TABS: 40.0000 mg | ORAL_TABLET | Freq: Every day | ORAL | 6 refills | Status: DC

## 2022-10-17 MED ORDER — SENNOSIDES-DOCUSATE SODIUM 8.6-50 MG PO TABS: 1.0000 | ORAL_TABLET | Freq: Two times a day (BID) | ORAL | Status: DC

## 2022-10-17 MED ORDER — AMLODIPINE BESYLATE 10 MG PO TABS: 10.0000 mg | ORAL_TABLET | Freq: Every day | ORAL | Status: DC

## 2022-10-17 MED ORDER — METOPROLOL TARTRATE 100 MG PO TABS: 100.0000 mg | ORAL_TABLET | Freq: Two times a day (BID) | ORAL | Status: DC

## 2022-10-17 MED ORDER — ENSURE ENLIVE PO LIQD: 237.0000 mL | Freq: Three times a day (TID) | ORAL | 12 refills | Status: DC

## 2022-10-17 MED ORDER — ALUM & MAG HYDROXIDE-SIMETH 200-200-20 MG/5ML PO SUSP: 30.0000 mL | Freq: Four times a day (QID) | ORAL | 0 refills | Status: DC | PRN

## 2022-10-17 MED ORDER — OXYCODONE HCL 5 MG PO TABS: 5.0000 mg | ORAL_TABLET | Freq: Four times a day (QID) | ORAL | 0 refills | Status: AC | PRN

## 2022-10-17 NOTE — TOC Transition Note (Signed)
Transition of Care [facility]) - CM/SW Discharge Note   Patient Details  Name: Shane Chambers MRN: [id number] Date of Birth: 1961-06-22  Transition of Care [facility]) CM/SW Contact:  Amador Cunas, [place] Phone Number: 10/17/2022, 2:44 PM   Clinical Narrative:  Pt for dc back to [facility] where he is a LTC resident. Confirmed with Ebony Hail in admissions they are prepared to admit pt to room 423B and will continue PT/OT. Pt's mother aware of dc and reports agreeable. RN provided with number for report and PTAR arranged for transport. SW signing off at dc.  Wandra Feinstein, MSW, LCSW [phone] (coverage)      Final next level of care: Skilled Nursing Facility Barriers to Discharge: No Barriers Identified   Patient Goals and CMS Choice Patient states their goals for this hospitalization and ongoing recovery are:: return to LTC   Choice offered to / list presented to : NA  Discharge Placement              Patient chooses bed at: [facility] Patient to be transferred to facility by: [place] Name of family member notified: [name] Patient and family notified of of transfer: 10/17/22  Discharge Plan and Services In-house Referral: Clinical Social Work                                   Social Determinants of Health ([place]) Interventions     Readmission Risk Interventions    10/03/2022   10:08 AM 09/26/2022    1:16 PM 04/23/2022   10:20 AM  Readmission Risk Prevention Plan  Transportation Screening Complete Complete Complete  PCP or Specialist Appt within 3-5 Days  Complete Complete  HRI or Home Care Consult  Complete Complete  Social Work Consult for [place] Planning/Counseling  Complete Complete  Palliative Care Screening  Not Applicable Not Applicable  Medication Review [employer])  Complete Complete  HRI or Home Care Consult Complete    SW Recovery Care/Counseling Consult Complete    Palliative Care Screening Not  Applicable    Skilled Nursing Facility Complete

## 2022-10-17 NOTE — Progress Notes (Signed)
Report called to [facility]. Report called to nurse. AVS reviewed and sent with patient. Transported via [employer]

## 2022-10-17 NOTE — Progress Notes (Signed)
Physical Therapy Treatment Patient Details Name: Shane Chambers MRN: [id number] DOB: 02/22/1961 Today's Date: 10/17/2022   History of Present Illness Pt is a 61 yo male admitted after recent hospitalization for cellulitis from 11/16-11/22. Pt sent home with antibiotics but returned with leg pain.  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 CT scan with no acute abnomality.  PMH: DM, CAD, CABG, COPD, HTN, PVD, AKI.    PT Comments    Pt received in bed. Alert but slow to respond. He required max assist supine <> sit. Unable to sustain EOB siting due to heavy L lean. LE exercises performed supine. Limb protector placed RLE. Pt supine in bed at end of session HOB at 45 degrees.    Recommendations for follow up therapy are one component of a multi-disciplinary discharge planning process, led by the attending physician.  Recommendations may be updated based on patient status, additional functional criteria and insurance authorization.  Follow Up Recommendations  Skilled nursing-short term rehab (<3 hours/day) Can patient physically be transported by private vehicle: No   Assistance Recommended at Discharge Frequent or constant Supervision/Assistance  Patient can return home with the following Two people to help with walking and/or transfers;Two people to help with bathing/dressing/bathroom;Direct supervision/assist for medications management;Assist for transportation;Direct supervision/assist for financial management;Help with stairs or ramp for entrance   Equipment Recommendations  None recommended by PT    Recommendations for Other Services       Precautions / Restrictions Precautions Precautions: Fall;Other (comment) Precaution Comments: cortrak Required Braces or Orthoses: Other Brace Splint/Cast: For R BKA Other Brace: limb protector R residual limb Restrictions Weight Bearing Restrictions: Yes RLE Weight Bearing: Non weight  bearing     Mobility  Bed Mobility Overal bed mobility: Needs Assistance Bed Mobility: Supine to Sit, Sit to Supine     Supine to sit: Max assist, HOB elevated Sit to supine: Max assist   General bed mobility comments: decreased initiation, assist for all aspects of mobility, increaed time    Transfers                        Ambulation/Gait                   Stairs             Wheelchair Mobility    Modified Rankin (Stroke Patients Only)       Balance Overall balance assessment: Needs assistance Sitting-balance support: Feet unsupported, Bilateral upper extremity supported Sitting balance-Leahy Scale: Poor Sitting balance - Comments: Unable to sustain sitting due to heavy L lean Postural control: Left lateral lean                                  Cognition Arousal/Alertness: Awake/alert Behavior During Therapy: Flat affect Overall Cognitive Status: Impaired/Different from baseline Area of Impairment: Orientation, Attention, Memory, Following commands, Safety/judgement, Awareness, Problem solving                 Orientation Level: Disoriented to, Time, Situation Current Attention Level: Sustained Memory: Decreased short-term memory, Decreased recall of precautions Following Commands: Follows one step commands with increased time Safety/Judgement: Decreased awareness of safety, Decreased awareness of deficits Awareness: Intellectual Problem Solving: Slow processing, Decreased initiation, Requires tactile cues, Requires verbal cues, Difficulty sequencing General Comments: slow to respond. Perseverating/repeating what therapist says (i.e. when  asked, "what is the month?" Pt starts repeating 'month, month, month.'        Exercises General Exercises - Lower Extremity Straight Leg Raises: AAROM, Left, 5 reps Amputee Exercises Quad Sets: AROM, Right, 10 reps Hip ABduction/ADduction: AROM, Supine, Left, Right, 10  reps Knee Flexion: AROM, Right, Left, 10 reps    General Comments General comments (skin integrity, edema, etc.): VSS on RA      Pertinent Vitals/Pain Pain Assessment Pain Assessment: 0-10 Pain Score: 6  Pain Location: R residual limb Pain Descriptors / Indicators: Discomfort, Guarding, Grimacing, Sore Pain Intervention(s): Monitored during session, Limited activity within patient's tolerance, Repositioned    Home Living                          Prior Function            PT Goals (current goals can now be found in the care plan section) Acute Rehab PT Goals Patient Stated Goal: rehab Progress towards PT goals: Not progressing toward goals - comment (poor balance and activity tolerance)    Frequency    Min 2X/week      PT Plan Current plan remains appropriate    Co-evaluation              AM-PAC PT "6 Clicks" Mobility   Outcome Measure  Help needed turning from your back to your side while in a flat bed without using bedrails?: A Lot Help needed moving from lying on your back to sitting on the side of a flat bed without using bedrails?: A Lot Help needed moving to and from a bed to a chair (including a wheelchair)?: Total Help needed standing up from a chair using your arms (e.g., wheelchair or bedside chair)?: Total Help needed to walk in hospital room?: Total Help needed climbing 3-5 steps with a railing? : Total 6 Click Score: 8    End of Session   Activity Tolerance: Patient limited by fatigue Patient left: in bed;with call bell/phone within reach;with bed alarm set Nurse Communication: Mobility status;Need for lift equipment PT Visit Diagnosis: Muscle weakness (generalized) (M62.81);Difficulty in walking, not elsewhere classified (R26.2);Other abnormalities of gait and mobility (R26.89)     Time: 1003-1020 PT Time Calculation (min) (ACUTE ONLY): 17 min  Charges:  $Therapeutic Activity: 8-22 mins                     Lorrin Goodell, PT   Office # [phone] Pager [phone]    Lorriane Shire 10/17/2022, 11:06 AM

## 2022-10-17 NOTE — Progress Notes (Signed)
Discussed with nursing staff during progression rounds-patient apparently consumed significant portion of his meals along with Ensure yesterday. He was working on his breakfast this morning when I saw him. Remove NG tube Should be okay to discharge to SNF today. Called [phone] to leave a voicemail.

## 2022-10-17 NOTE — NC FL2 (Signed)
[place] LEVEL OF CARE FORM     IDENTIFICATION  Patient Name: Shane Chambers Birthdate: [birth year] Sex: male Admission Date (Current Location): 10/02/2022  [facility] and [place] Number:  Herbalist and Address:  [facility] [place], [place] [address] [place], [place] [address]      Provider Number: [id number]  Attending Physician Name and Address:  Jonetta Osgood, MD  Relative Name and Phone Number:       Current Level of Care: Hospital Recommended Level of Care: [place] Prior Approval Number:    Date Approved/Denied:   PASRR Number:    Discharge Plan: SNF    Current Diagnoses: Patient Active Problem List   Diagnosis Date Noted   Acute hypoactive delirium due to another medical condition 10/13/2022   Septic arthritis of right ankle ([place]) 10/04/2022   Cellulitis 10/02/2022   Subacute osteomyelitis, right ankle and foot ([place]) 10/02/2022   Hypothyroidism 10/02/2022   Hypoalbuminemia 10/02/2022   Cellulitis of right lower extremity 09/25/2022   Lactic acidosis 04/23/2022   Anemia of chronic disease 04/23/2022   Obesity, Class III, BMI 40-49.9 (morbid obesity) ([place]) 04/23/2022   AKI (acute kidney injury) ([place]) 04/22/2022   Elevated troponin 04/22/2022   Cancer of sigmoid colon ([place]) 12/16/2021   Constipation 12/16/2021   GERD (gastroesophageal reflux disease) 12/16/2021   Iron deficiency anemia 09/17/2021   Rectal cancer ([place]) 09/03/2021   Bilateral lower leg cellulitis 66/04/3015   Systolic and diastolic CHF, chronic ([place]) 11/22/2020   CHF exacerbation ([place]) 08/01/2020   Visit for wound check 05/28/2020   OSA (obstructive sleep apnea) 05/24/2020   Acute exacerbation of chronic obstructive pulmonary disease (COPD) ([place]) 04/29/2020   Leukocytosis 04/29/2020   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   Ischemic cardiomyopathy    Coronary artery disease    Acute on chronic combined systolic and diastolic CHF (congestive  heart failure) ([place]) 03/05/2020   Acute hyponatremia 03/05/2020   Severe Vitamin D deficiency 12/02/2019   Hypokalemia 12/01/2019   Hyperlipidemia 12/01/2019   BPH (benign prostatic hyperplasia) 12/01/2019   Normocytic anemia 12/01/2019   Pneumonia due to COVID-19 virus 12/01/2019   Hypoxia    SOB (shortness of breath) 11/16/2019   Severe sepsis ([place]) 11/16/2019   Hypertension    Type 2 diabetes mellitus ([place])    Depression    Acute bronchitis with bronchospasm 11/15/2019    Orientation RESPIRATION BLADDER Height & Weight     Self  Normal Incontinent Weight: 259 lb 0.7 oz (117.5 kg) Height:  5' 9.02" (175.3 cm)  BEHAVIORAL SYMPTOMS/MOOD NEUROLOGICAL BOWEL NUTRITION STATUS      Incontinent Diet (see dc summary)  AMBULATORY STATUS COMMUNICATION OF NEEDS Skin   Extensive Assist Verbally Surgical wounds                       Personal Care Assistance Level of Assistance  Bathing, Feeding, Dressing Bathing Assistance: Maximum assistance Feeding assistance: Limited assistance Dressing Assistance: Maximum assistance     Functional Limitations Info  Sight, Hearing, Speech Sight Info: Adequate Hearing Info: Adequate Speech Info: Adequate    SPECIAL CARE FACTORS FREQUENCY  PT (By licensed PT), OT (By licensed OT)                    Contractures Contractures Info: Not present    Additional Factors Info  Code Status Code Status Info: FULL CODE Allergies Info: Ace Inhibitors, ivory soap, penicillins  Current Medications (10/17/2022):  This is the current hospital active medication list Current Facility-Administered Medications  Medication Dose Route Frequency Provider Last Rate Last Admin   alum & mag hydroxide-simeth (MAALOX/MYLANTA) 200-200-20 MG/5ML suspension 30 mL  30 mL Oral Q6H PRN Elgergawy, Silver Huguenin, MD   30 mL at 10/13/22 2018   amLODipine (NORVASC) tablet 10 mg  10 mg Oral Daily Elgergawy, Silver Huguenin, MD   10 mg at 10/17/22 0900    arformoterol (BROVANA) nebulizer solution 15 mcg  15 mcg Nebulization BID Elgergawy, Silver Huguenin, MD   15 mcg at 10/17/22 0741   aspirin EC tablet 81 mg  81 mg Oral Q breakfast Zierle-Ghosh, Asia B, DO   81 mg at 10/17/22 0900   atorvastatin (LIPITOR) tablet 80 mg  80 mg Oral QHS Elgergawy, Silver Huguenin, MD   80 mg at 10/16/22 2128   budesonide (PULMICORT) nebulizer solution 0.25 mg  0.25 mg Nebulization BID Elgergawy, Silver Huguenin, MD   0.25 mg at 10/17/22 0741   feeding supplement (ENSURE ENLIVE / ENSURE PLUS) liquid 237 mL  237 mL Oral BID BM Elgergawy, Silver Huguenin, MD   237 mL at 10/17/22 1353   feeding supplement (JEVITY 1.5 CAL/FIBER) liquid 1,000 mL  1,000 mL Per Tube Q24H Elgergawy, Silver Huguenin, MD   1,000 mL at 10/16/22 1805   feeding supplement (PROSource TF20) liquid 60 mL  60 mL Per Tube Daily Elgergawy, Silver Huguenin, MD   60 mL at 10/17/22 0904   free water 200 mL  200 mL Per Tube Q4H Elgergawy, Silver Huguenin, MD   200 mL at 10/17/22 0900   heparin injection 5,000 Units  5,000 Units Subcutaneous Q8H Zierle-Ghosh, Asia B, DO   5,000 Units at 10/17/22 1350   HYDROcodone-acetaminophen (NORCO/VICODIN) 5-325 MG per tablet 1 tablet  1 tablet Oral Q6H PRN Elgergawy, Silver Huguenin, MD   1 tablet at 10/17/22 0411   insulin aspart (novoLOG) injection 0-15 Units  0-15 Units Subcutaneous Q4H Elgergawy, Silver Huguenin, MD   3 Units at 10/17/22 1350   ipratropium-albuterol (DUONEB) 0.5-2.5 (3) MG/3ML nebulizer solution 3 mL  3 mL Nebulization Q6H PRN Ghimire, Henreitta Leber, MD       labetalol (NORMODYNE) injection 10 mg  10 mg Intravenous Q4H PRN Howerter, Justin B, DO   10 mg at 10/17/22 1404   levothyroxine (SYNTHROID) tablet 75 mcg  75 mcg Oral QAC breakfast Elgergawy, Silver Huguenin, MD   75 mcg at 10/17/22 0411   magnesium oxide (MAG-OX) tablet 400 mg  400 mg Oral BID Elgergawy, Silver Huguenin, MD   400 mg at 10/17/22 0900   metoprolol tartrate (LOPRESSOR) tablet 100 mg  100 mg Oral BID Elgergawy, Silver Huguenin, MD   100 mg at 10/17/22 0900   oxyCODONE  (Oxy IR/ROXICODONE) immediate release tablet 5 mg  5 mg Oral Q4H PRN Jonetta Osgood, MD   5 mg at 10/17/22 0143   pantoprazole (PROTONIX) injection 40 mg  40 mg Intravenous Q24H Howerter, Justin B, DO   40 mg at 10/16/22 2129   polyethylene glycol (MIRALAX / GLYCOLAX) packet 17 g  17 g Oral Daily PRN Zierle-Ghosh, Asia B, DO       senna-docusate (Senokot-S) tablet 1 tablet  1 tablet Oral BID Elgergawy, Silver Huguenin, MD   1 tablet at 10/17/22 0900   tamsulosin (FLOMAX) capsule 0.8 mg  0.8 mg Oral Daily Zierle-Ghosh, Asia B, DO   0.8 mg at 10/17/22 0900   thiamine (VITAMIN B1) tablet 100  mg  100 mg Oral Daily Elgergawy, Silver Huguenin, MD   100 mg at 10/17/22 0900     Discharge Medications: Please see discharge summary for a list of discharge medications.  Relevant Imaging Results:  Relevant Lab Results:   Additional Information SSN: [id number]  Amador Cunas, [name]

## 2022-10-17 NOTE — Discharge Summary (Signed)
PATIENT DETAILS Name: Shane Chambers Age: 61 y.o. Sex: male Date of Birth: [birth year] MRN: [id number]. Admitting Physician: Rolla Plate, DO BTD:VVOHY, Fara Olden, MD  Admit Date: 10/02/2022 Discharge date: 10/17/2022  Recommendations for Outpatient Follow-up:  Follow up with PCP in 1-2 weeks Please obtain CMP/CBC/Magnesium in one week AVOID QTc prolonging medications Ensure follow up with Psychiatry Ensure follow-up with orthopedics-Dr. Sharol Given  Admitted From:  SNF   Disposition: Skilled nursing facility   Discharge Condition: fair  CODE STATUS:   Code Status: Full Code   Diet recommendation:  Diet Order             Diet - low sodium heart healthy           Diet Carb Modified           Diet regular Room service appropriate? Yes; Fluid consistency: Thin  Diet effective now                    Brief Summary: Patient is a 61 y.o.  male with history of CAD s/p CABG, COPD, HTN, DM-2, PAD who was just hospitalized from 11/16-11/22 for cellulitis of right leg-presented with worsening right leg pain-upon further evaluation-he was found to have severe soft tissue infection of the right leg with septic right ankle and osteomyelitis.  Patient was evaluated by orthopedics-underwent BKA-subsequently hospital course complicated by severe encephalopathy, and torsades de points.   Significant events: 11/16-11/22>> hospitalization for RLE cellulitis-discharged on oral antibiotics. 11/23>> admit to [facility] cellulitis-osteomyelitis/septic arthritis. 11/26>> BKA 12/02>> torsades de points-spontaneously converted to sinus rhythm.   Significant studies: 11/23>> CT right leg: Severe cellulitis/probable myofascitis-multiple complex fluid collections around the ankle-erosive changes along the deep cortex of the fibula, medial malleolus-suspicious for osteomyelitis. 11/24>> MRI right leg: Suspicious for right ankle septic arthritis with periarticular osteomyelitis.   Moderate-sized tibiotalar joint effusion. 11/26>> renal ultrasound: Normal 11/29>> CT head: No acute intracranial abnormality. 11/30>> hazy airspace opacity left upper lobe. 12/04>> echo: EF 55-60%   Significant microbiology data: 11/30>> blood culture: Negative   Procedures: 11/26>> BKA Sharol Given) 12/01>>C[place]rak    Consults: Orthopedics Psychiatry Nephrology Cardiology  Brief Hospital Course: Right lower extremity severe soft tissue infection with acute osteomyelitis right ankle septic arthritis Evaluated by orthopedics-s/p BKA on 11/26 Completed course of antibiotics Please ensure follow-up with Dr. Sharol Given postdischarge. Per Dr. Laurence Ferrari dressing changes as needed Mobilize with PT/OT at [facility] ([facility]   SIRS Aspiration PNA Afebrile Suspected may have had an aspiration event when he had torsades/delirium Completed course of antibiotics Maintain aspiration precautions.   Torsades de points on 12/02 QTc elongation due to antipsychotics QTc much better as of EKG on 12/6 Keep K>4, Mg>2 Avoid QTc prolonging agents   AKI on CKD stage IIIb Hemodynamically mediated Resolved-creatinine now at baseline.   Acute metabolic encephalopathy Due to AKI/RLE soft tissue infection Some suspicion for underlying dementia Significantly improved-awake/alert answering all questions appropriately   Normocytic anemia due to CKD Acute blood loss anemia-s/p RLE BKA Anemia of critical illness Drop in hemoglobin due to acute illness/underlying CKD and acute blood loss anemia s/p BKA.   Required several units of PRBC-hemoglobin stable at 8.7 on discharge. Follow CBC closely at SNF.   Hypernatremia Resolved   CAD s/p CABG No anginal symptoms Aspirin/statin/beta-blocker   Chronic HFpEF Euvolemic Resume diuretics   HTN Stable on beta-blocker and amlodipine  DM2 (A1c 5.6 on 11/17) Stable on SSI Resume metformin on discharge  Pressure Ulcer: Pressure Injury 10/03/22 Buttocks Stage 1 -  Intact skin with non-blanchable redness of a localized area usually over a bony prominence. (Active)  10/03/22 0220  Location: Buttocks  Location Orientation:   Staging: Stage 1 -  Intact skin with non-blanchable redness of a localized area usually over a bony prominence.  Wound Description (Comments):   Present on Admission: Yes  Dressing Type Foam - Lift dressing to assess site every shift 10/16/22 1942   Hypothyroidism Levothyroxine   BPH Flomax   Depression Currently off all antidepressant medications given torsade/QTc prolongation Received secure text from Priscille Loveless Perry-FNP-with psychiatry on 12/7-recommendations are to start Remeron in the outpatient setting upon follow up with psychiatry.  No additional recommendations.   Debility/deconditioning/functional quadriplegia SNF planned on discharge.  OSA Resume CPAP nightly  COPD Not in exacerbation Continue bronchodilators   Nutrition Status: Nutrition Problem: Increased nutrient needs Etiology: wound healing Signs/Symptoms: estimated needs Interventions: Ensure Enlive (each supplement provides 350kcal and 20 grams of protein), Tube feeding Briefly on nocturnal tube feeds-but since oral intake has improved-this has since been discontinued. Continue supplements on discharge.  Morbid Obesity: Estimated body mass index is 38.24 kg/m as calculated from the following:   Height as of this encounter: 5' 9.02" (1.753 m).   Weight as of this encounter: 117.5 kg.   RN pressure injury documentation: Pressure Injury 11/22/20 Foot Right;Medial Deep Tissue Pressure Injury - Purple or maroon localized area of discolored intact skin or blood-filled blister due to damage of underlying soft tissue from pressure and/or shear. (Active)  11/22/20 2312  Location: Foot (bottom: medial)  Location Orientation: Right;Medial  Staging: Deep Tissue Pressure Injury - Purple or maroon localized area of discolored intact skin or blood-filled  blister due to damage of underlying soft tissue from pressure and/or shear.  Wound Description (Comments):   Present on Admission:      Pressure Injury 11/27/20 Pretibial Left Unstageable - Full thickness tissue loss in which the base of the injury is covered by slough (yellow, tan, gray, green or brown) and/or eschar (tan, brown or black) in the wound bed. (Active)  11/27/20 0756  Location: Pretibial  Location Orientation: Left  Staging: Unstageable - Full thickness tissue loss in which the base of the injury is covered by slough (yellow, tan, gray, green or brown) and/or eschar (tan, brown or black) in the wound bed.  Wound Description (Comments):   Present on Admission:      Pressure Injury 10/03/22 Buttocks Stage 1 -  Intact skin with non-blanchable redness of a localized area usually over a bony prominence. (Active)  10/03/22 0220  Location: Buttocks  Location Orientation:   Staging: Stage 1 -  Intact skin with non-blanchable redness of a localized area usually over a bony prominence.  Wound Description (Comments):   Present on Admission: Yes  Dressing Type Foam - Lift dressing to assess site every shift 10/16/22 1942     Discharge Diagnoses:  Principal Problem:   Cellulitis Active Problems:   Hypertension   Type 2 diabetes mellitus (HCC)   Hyperlipidemia   OSA (obstructive sleep apnea)   GERD (gastroesophageal reflux disease)   AKI (acute kidney injury) ([place])   Subacute osteomyelitis, right ankle and foot (HCC)   Hypothyroidism   Hypoalbuminemia   Septic arthritis of right ankle (HCC)   Acute hypoactive delirium due to another medical condition   Discharge Instructions:  Activity:  As tolerated with Full fall precautions use walker/cane & assistance as needed  Discharge Instructions     Call MD for:  difficulty breathing,  headache or visual disturbances   Complete by: As directed    Call MD for:  redness, tenderness, or signs of infection (pain, swelling,  redness, odor or green/yellow discharge around incision site)   Complete by: As directed    Change dressing (specify)   Complete by: As directed    Dressing change: AS NEEDED   Diet - low sodium heart healthy   Complete by: As directed    Diet Carb Modified   Complete by: As directed    Discharge instructions   Complete by: As directed    Follow with Primary MD  Caprice Renshaw, MD in 1-2 weeks  Please get a complete blood count and chemistry panel checked by your Primary MD at your next visit, and again as instructed by your Primary MD.  Get Medicines reviewed and adjusted: Please take all your medications with you for your next visit with your Primary MD  Laboratory/radiological data: Please request your Primary MD to go over all hospital tests and procedure/radiological results at the follow up, please ask your Primary MD to get all Hospital records sent to his/her office.  In some cases, they will be blood work, cultures and biopsy results pending at the time of your discharge. Please request that your primary care M.D. follows up on these results.  Also Note the following: If you experience worsening of your admission symptoms, develop shortness of breath, life threatening emergency, suicidal or homicidal thoughts you must seek medical attention immediately by calling 911 or calling your MD immediately  if symptoms less severe.  You must read complete instructions/literature along with all the possible adverse reactions/side effects for all the Medicines you take and that have been prescribed to you. Take any new Medicines after you have completely understood and accpet all the possible adverse reactions/side effects.   Do not drive when taking Pain medications or sleeping medications (Benzodaizepines)  Do not take more than prescribed Pain, Sleep and Anxiety Medications. It is not advisable to combine anxiety,sleep and pain medications without talking with your primary care  practitioner  Special Instructions: If you have smoked or chewed Tobacco  in the last 2 yrs please stop smoking, stop any regular Alcohol  and or any Recreational drug use.  Wear Seat belts while driving.  Please note: You were cared for by a hospitalist during your hospital stay. Once you are discharged, your primary care physician will handle any further medical issues. Please note that NO REFILLS for any discharge medications will be authorized once you are discharged, as it is imperative that you return to your primary care physician (or establish a relationship with a primary care physician if you do not have one) for your post hospital discharge needs so that they can reassess your need for medications and monitor your lab values.   Check CBGs before meals and at bedtime   Increase activity slowly   Complete by: As directed       Allergies as of 10/17/2022       Reactions   Ace Inhibitors Swelling, Cough   (Not on MAR at [facility])   Haloperidol And Related    Do NOT give anti-psychotics due to risk of  Torsades and QT prolongation (per Dr. Graciela Husbands request)   Other Itching   [name]   Shellfish Allergy    Penicillins Itching, Rash   Has patient had a PCN reaction causing immediate rash, facial/tongue/throat swelling, SOB or lightheadedness with hypotension: Unknown Has  patient had a PCN reaction causing severe rash involving mucus membranes or skin necrosis: Unknown Has patient had a PCN reaction that required hospitalization: Unknown Has patient had a PCN reaction occurring within the last 10 years: Unknown If all of the above answers are "NO", then may proceed with Cephalosporin use.        Medication List     STOP taking these medications    ACIDOPHILUS PO   amitriptyline 75 MG tablet Commonly known as: ELAVIL   cefadroxil 500 MG capsule Commonly known as: DURICEF   doxycycline 100 MG tablet Commonly known as: VIBRA-TABS    gabapentin 300 MG capsule Commonly known as: NEURONTIN   insulin aspart protamine- aspart (70-30) 100 UNIT/ML injection Commonly known as: NOVOLOG MIX 70/30   losartan 50 MG tablet Commonly known as: COZAAR   metoprolol succinate 25 MG 24 hr tablet Commonly known as: TOPROL-XL   venlafaxine XR 75 MG 24 hr capsule Commonly known as: EFFEXOR-XR       TAKE these medications    acetaminophen 500 MG tablet Commonly known as: TYLENOL Take 1,000 mg by mouth every 8 (eight) hours as needed for fever or moderate pain.   albuterol 108 (90 Base) MCG/ACT inhaler Commonly known as: VENTOLIN HFA Inhale 2 puffs into the lungs every 6 (six) hours as needed for wheezing or shortness of breath.   alum & mag hydroxide-simeth 200-200-20 MG/5ML suspension Commonly known as: MAALOX/MYLANTA Take 30 mLs by mouth every 6 (six) hours as needed for indigestion or heartburn.   amLODipine 10 MG tablet Commonly known as: NORVASC Take 1 tablet (10 mg total) by mouth daily. Start taking on: October 18, 2022   aspirin EC 81 MG tablet Take 1 tablet (81 mg total) by mouth daily with breakfast.   atorvastatin 80 MG tablet Commonly known as: LIPITOR Take 80 mg by mouth at bedtime.   budesonide-formoterol 160-4.5 MCG/ACT inhaler Commonly known as: Symbicort Inhale 2 puffs into the lungs in the morning and at bedtime.   docusate sodium 100 MG capsule Commonly known as: COLACE Take 100 mg by mouth daily.   feeding supplement Liqd Take 237 mLs by mouth 3 (three) times daily between meals.   FERROUS GLUCONATE PO Take 325 mg by mouth daily.   ipratropium-albuterol 0.5-2.5 (3) MG/3ML Soln Commonly known as: DUONEB Take 3 mLs by nebulization every 6 (six) hours as needed.   levothyroxine 75 MCG tablet Commonly known as: SYNTHROID Take 75 mcg by mouth daily before breakfast.   loperamide HCl 2 MG/15ML solution Commonly known as: IMODIUM Take 4 mg by mouth every 6 (six) hours as needed for  diarrhea or loose stools.   magnesium oxide 400 MG tablet Commonly known as: MAG-OX Take 400 mg by mouth daily.   metFORMIN 1000 MG tablet Commonly known as: GLUCOPHAGE Take 1,000 mg by mouth 2 (two) times daily.   metoprolol tartrate 100 MG tablet Commonly known as: LOPRESSOR Take 1 tablet (100 mg total) by mouth 2 (two) times daily.   multivitamin tablet Take 1 tablet by mouth daily.   NovoLOG FlexPen 100 UNIT/ML FlexPen Generic drug: insulin aspart 0-15 Units, Subcutaneous, 3 times daily with meals CBG < 70: implement hypoglycemia protocol-call MD CBG 70 - 120: 0 units CBG 121 - 150: 2 units CBG 151 - 200: 3 units CBG 201 - 250: 5 units CBG 251 - 300: 8 units CBG 301 - 350: 11 units CBG 351 - 400: 15 units CBG > 400:   omeprazole 40  MG capsule Commonly known as: PRILOSEC Take 40 mg by mouth daily.   oxyCODONE 5 MG immediate release tablet Commonly known as: Oxy IR/ROXICODONE Take 1 tablet (5 mg total) by mouth every 6 (six) hours as needed for up to 3 days for moderate pain or severe pain.   polyethylene glycol 17 g packet Commonly known as: MIRALAX / GLYCOLAX Take 17 g by mouth daily as needed for moderate constipation.   polyvinyl alcohol 1.4 % ophthalmic solution Commonly known as: LIQUIFILM TEARS Place 1 drop into both eyes daily.   potassium chloride SA 20 MEQ tablet Commonly known as: KLOR-CON M Take 1 tablet (20 mEq total) by mouth daily.   PRO-STAT AWC PO Take 30 mLs by mouth 2 (two) times daily.   senna-docusate 8.6-50 MG tablet Commonly known as: Senokot-S Take 1 tablet by mouth 2 (two) times daily.   tamsulosin 0.4 MG Caps capsule Commonly known as: FLOMAX Take 0.8 mg by mouth daily.   thiamine 100 MG tablet Commonly known as: Vitamin B-1 Take 1 tablet (100 mg total) by mouth daily. Start taking on: October 18, 2022   torsemide 20 MG tablet Commonly known as: DEMADEX Take 2 tablets (40 mg total) by mouth daily. What changed: how much  to take   Vitamin D 50 MCG (2000 UT) tablet Take 2,000 Units by mouth daily.               Discharge Care Instructions  (From admission, onward)           Start     Ordered   10/17/22 0000  Change dressing (specify)       Comments: Dressing change: AS NEEDED   10/17/22 1112            Follow-up Information     Newt Minion, MD Follow up.   Specialty: Orthopedic Surgery Contact information: [place] [address] [phone]         Caprice Renshaw, MD. Schedule an appointment as soon as possible for a visit in 1 week(s).   Specialty: Internal Medicine Contact information: [place] [address] [phone]                Allergies  Allergen Reactions   Ace Inhibitors Swelling and Cough    (Not on MAR at [facility])   Haloperidol And Related     Do NOT give anti-psychotics due to risk of  Torsades and QT prolongation (per Dr. Graciela Husbands request)   Other Itching    [name]   Shellfish Allergy    Penicillins Itching and Rash    Has patient had a PCN reaction causing immediate rash, facial/tongue/throat swelling, SOB or lightheadedness with hypotension: Unknown Has patient had a PCN reaction causing severe rash involving mucus membranes or skin necrosis: Unknown Has patient had a PCN reaction that required hospitalization: Unknown Has patient had a PCN reaction occurring within the last 10 years: Unknown If all of the above answers are "NO", then may proceed with Cephalosporin use.      Other Procedures/Studies: ECHOCARDIOGRAM COMPLETE  Result Date: 10/13/2022    ECHOCARDIOGRAM REPORT   Patient Name:   KRISTAIN HU Date of Exam: 10/13/2022 Medical Rec #:  [id number]       Height:       69.0 in Accession #:    [id number]      Weight:       260.1 lb Date of Birth:  Aug 16, 1961       BSA:          2.310 m Patient Age:    54 years        BP:           149/66 mmHg Patient Gender: M                HR:           68 bpm. Exam Location:  Inpatient Procedure: 2D Echo, Cardiac Doppler and Color Doppler Indications:    Ventricular Tachycardia I47.2  History:        Patient has prior history of Echocardiogram examinations, most                 recent 08/03/2020. Cardiomyopathy and CHF, CAD, Prior CABG,                 Signs/Symptoms:Shortness of Breath; Risk Factors:Hypertension,                 Sleep Apnea, Diabetes and Dyslipidemia. Prior CABG 03/13/20.  Sonographer:    Ronny Flurry Referring Phys: [place]  1. Left ventricular ejection fraction, by estimation, is 55 to 60%. The left ventricle has normal function. The left ventricle has no regional wall motion abnormalities. There is moderate concentric left ventricular hypertrophy. Left ventricular diastolic parameters are indeterminate.  2. Right ventricular systolic function is normal. The right ventricular size is normal.  3. The mitral valve is normal in structure. Mild mitral valve regurgitation.  4. AV is thickened, calcified. Difficult to see well Peak and mean gradients through the valve are 21 and 12 mm Hg respectively. AVA (VTI) is 1.8 cm2. Dimensionless index is 0.5 consistent with mild AS.Marland Kitchen The aortic valve was not well visualized. Aortic valve regurgitation is not visualized. FINDINGS  Left Ventricle: Left ventricular ejection fraction, by estimation, is 55 to 60%. The left ventricle has normal function. The left ventricle has no regional wall motion abnormalities. The left ventricular internal cavity size was normal in size. There is  moderate concentric left ventricular hypertrophy. Left ventricular diastolic parameters are indeterminate. Right Ventricle: The right ventricular size is normal. Right vetricular wall thickness was not assessed. Right ventricular systolic function is normal. Left Atrium: Left atrial size was normal in size. Right Atrium: Right atrial size was normal in size. Pericardium: There is no  evidence of pericardial effusion. Mitral Valve: The mitral valve is normal in structure. Mild mitral valve regurgitation. Tricuspid Valve: The tricuspid valve is normal in structure. Tricuspid valve regurgitation is mild. Aortic Valve: AV is thickened, calcified. Difficult to see well Peak and mean gradients through the valve are 21 and 12 mm Hg respectively. AVA (VTI) is 1.8 cm2. Dimensionless index is 0.5 consistent with mild AS. The aortic valve was not well visualized. Aortic valve regurgitation is not visualized. Aortic valve mean gradient measures 10.5 mmHg. Aortic valve peak gradient measures 18.1 mmHg. Aortic valve area, by VTI measures 2.07 cm. Pulmonic Valve: The pulmonic valve was normal in structure. Pulmonic valve regurgitation is not visualized. Aorta: The aortic root and ascending aorta are structurally normal, with no evidence of dilitation. IAS/Shunts: No atrial level shunt detected by color flow Doppler.  LEFT VENTRICLE PLAX 2D LVIDd:         5.10 cm   Diastology LVIDs:         3.60 cm   LV e' medial:    4.01 cm/s LV PW:  1.50 cm   LV E/e' medial:  30.1 LV IVS:        1.40 cm   LV e' lateral:   6.89 cm/s LVOT diam:     2.30 cm   LV E/e' lateral: 17.6 LV SV:         96 LV SV Index:   42 LVOT Area:     4.15 cm  RIGHT VENTRICLE            IVC RV S prime:     8.45 cm/s  IVC diam: 1.80 cm TAPSE (M-mode): 1.4 cm LEFT ATRIUM             Index        RIGHT ATRIUM           Index LA diam:        4.90 cm 2.12 cm/m   RA Area:     12.70 cm LA Vol (A2C):   72.7 ml 31.47 ml/m  RA Volume:   25.40 ml  10.99 ml/m LA Vol (A4C):   65.9 ml 28.53 ml/m LA Biplane Vol: 69.6 ml 30.13 ml/m  AORTIC VALVE AV Area (Vmax):    2.01 cm AV Area (Vmean):   1.90 cm AV Area (VTI):     2.07 cm AV Vmax:           213.00 cm/s AV Vmean:          151.500 cm/s AV VTI:            0.464 m AV Peak Grad:      18.1 mmHg AV Mean Grad:      10.5 mmHg LVOT Vmax:         103.00 cm/s LVOT Vmean:        69.400 cm/s LVOT VTI:           0.231 m LVOT/AV VTI ratio: 0.50  AORTA Ao Root diam: 3.30 cm Ao Asc diam:  3.60 cm MITRAL VALVE MV Area (PHT): 4.31 cm     SHUNTS MV Decel Time: 176 msec     Systemic VTI:  0.23 m MV E velocity: 121.00 cm/s  Systemic Diam: 2.30 cm MV A velocity: 62.70 cm/s MV E/A ratio:  1.93 Dorris Carnes MD Electronically signed by Dorris Carnes MD Signature Date/Time: 10/13/2022/2:37:40 PM    Final    DG CHEST PORT 1 VIEW  Result Date: 10/12/2022 CLINICAL DATA:  [id number] with ventricular arrhythmia. EXAM: PORTABLE CHEST 1 VIEW COMPARISON:  Portable chest 10/09/2022 FINDINGS: 12:30 a.m. A feeding tube has been inserted. The Dobbhoff tip is in the gastric antrum. Median sternotomy sutures and CABG changes. There is mild cardiomegaly and mild central vascular prominence but no overt edema. There are low lung volumes, particularly on the right where there is elevation of the hemidiaphragm to the mid hilar level. There are perihilar atelectatic bands in the lungs. No focal pneumonic process is seen but please note the right lower lung field is obscured by the elevated right diaphragm. No significant pleural effusion is seen. The mediastinum is normally outlined. Thoracic spondylosis and degenerative disc disease. The IMPRESSION: 1. Mild cardiomegaly and mild central vascular prominence but no overt edema. 2. Low lung volumes, particularly on the right where there is elevation of the hemidiaphragm. Perihilar atelectatic bands. 3. No visible infiltrate with right lower lung field obscured. 4. Feeding tube radiopaque tip in the gastric antrum. Electronically Signed   By: Telford Nab M.D.   On: 10/12/2022 00:48   DG  Abd Portable 1V  Result Date: 10/10/2022 CLINICAL DATA:  [id number] Encounter for feeding tube placement [id number] EXAM: PORTABLE ABDOMEN - 1 VIEW COMPARISON:  None Available. FINDINGS: Feeding tube tip overlies the stomach. Unremarkable upper abdominal bowel gas pattern. IMPRESSION: Feeding tube tip overlies the stomach.  Electronically Signed   By: Maurine Simmering M.D.   On: 10/10/2022 09:46   DG Chest Port 1 View  Result Date: 10/09/2022 CLINICAL DATA:  [id number] Fever [id number] EXAM: PORTABLE CHEST 1 VIEW COMPARISON:  Chest x-ray 09/25/2022, chest x-ray 04/22/2022 FINDINGS: The heart and mediastinal contours are unchanged. Developing hazy airspace opacity along the left upper lobe. No pulmonary edema. No pleural effusion. No pneumothorax. No acute osseous abnormality. Similar-appearing fractured sternotomy wires. IMPRESSION: 1. Developing hazy airspace opacity along the left upper lobe. 2. Similar-appearing fractured sternotomy wires. Electronically Signed   By: Iven Finn M.D.   On: 10/09/2022 14:52   CT HEAD WO CONTRAST (5MM)  Result Date: 10/08/2022 CLINICAL DATA:  Delirium, altered mental status EXAM: CT HEAD WITHOUT CONTRAST TECHNIQUE: Contiguous axial images were obtained from the base of the skull through the vertex without intravenous contrast. RADIATION DOSE REDUCTION: This exam was performed according to the departmental dose-optimization program which includes automated exposure control, adjustment of the mA and/or kV according to patient size and/or use of iterative reconstruction technique. COMPARISON:  None Available. FINDINGS: Brain: There is diffuse parenchymal volume loss, relatively advanced in relation of the patient's given age. Remote lacunar infarct noted within the left caudate body. No evidence of acute infarction, hemorrhage, hydrocephalus, extra-axial collection or mass lesion/mass effect. Vascular: No hyperdense vessel or unexpected calcification. Skull: Normal. Negative for fracture or focal lesion. Sinuses/Orbits: No acute finding. Other: Mastoid air cells and middle ear cavities are clear. IMPRESSION: 1. No acute intracranial abnormality. 2. Diffuse parenchymal volume loss, relatively advanced in relation of the patient's given age. 3. Remote lacunar infarct within the left caudate body.  Electronically Signed   By: Fidela Salisbury M.D.   On: 10/08/2022 11:02   US RENAL  Result Date: 10/05/2022 CLINICAL DATA:  Acute kidney injury. EXAM: RENAL / URINARY TRACT ULTRASOUND COMPLETE COMPARISON:  Renal ultrasound 09/26/2022 and CT scan 06/10/2022 FINDINGS: Right Kidney: Renal measurements: 12.3 x 6.0 x 6.3 cm = volume: 239.4 mL. Normal renal cortical thickness and echogenicity without focal lesions or hydronephrosis. Left Kidney: Renal measurements: 12.6 x 5.8 x 6.1 cm = volume: 230.2 mL. Normal renal cortical thickness and echogenicity without focal lesions or hydronephrosis. Bladder: Appears normal for degree of bladder distention. Other: None. IMPRESSION: Normal renal ultrasound examination. Electronically Signed   By: Marijo Sanes M.D.   On: 10/05/2022 16:19   MR TIBIA FIBULA RIGHT W WO CONTRAST  Result Date: 10/03/2022 CLINICAL DATA:  Soft tissue infection suspected, lower leg, xray done EXAM: MRI OF LOWER RIGHT EXTREMITY WITHOUT AND WITH CONTRAST TECHNIQUE: Multiplanar, multisequence MR imaging of the right lower extremity was performed both before and after administration of intravenous contrast. CONTRAST:  53m GADAVIST GADOBUTROL 1 MMOL/ML IV SOLN COMPARISON:  CT 10/02/2022 FINDINGS: Bones/Joint/Cartilage There is periarticular marrow edema and low T1 signal in the distal tibia, talus, and lateral malleolus with associated marrow enhancement. There is a tibiotalar joint effusion with a lobular appearance and mildly thickened synovial enhancement. 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 Ligaments The interosseous membrane is unremarkable. Muscles and Tendons There is tenosynovitis of the posterior tibial tendon and peroneal tendons above and below the medial malleolus, favored to be reactive.  No acute tendon tear. There is generalized muscle atrophy of the right lower extremity. Soft tissues There is skin thickening and subcutaneous soft tissue swelling  of the lower extremity with blister like skin lesions noted. IMPRESSION: Findings are suspicious for right ankle septic arthritis with periarticular osteomyelitis. Moderate size tibiotalar joint effusion. Diffuse lower extremity soft tissue swelling, as can be seen in cellulitis or lymphedema. Tenosynovitis of the posterior tibial tendon and peroneal tendons, favored to be reactive. Electronically Signed   By: Maurine Simmering M.D.   On: 10/03/2022 11:30   CT EXTREMITY LOWER RIGHT W CONTRAST  Result Date: 10/02/2022 CLINICAL DATA:  Soft tissue swelling of the right lower extremity. EXAM: CT OF THE LOWER RIGHT EXTREMITY WITH CONTRAST TECHNIQUE: Multidetector CT imaging of the lower right extremity was performed according to the standard protocol following intravenous contrast administration. RADIATION DOSE REDUCTION: This exam was performed according to the departmental dose-optimization program which includes automated exposure control, adjustment of the mA and/or kV according to patient size and/or use of iterative reconstruction technique. CONTRAST:  56m OMNIPAQUE IOHEXOL 300 MG/ML  SOLN COMPARISON:  Radiographs, same date. FINDINGS: Diffuse skin thickening and marked subcutaneous soft tissue swelling/edema/fluid consistent with severe cellulitis. Suspect scattered skin blisters. There are complex fluid collections surrounding the medial and aspects of the ankle. Medially the fluid collection measures approximately the 4.2 x 3.6 x 2.1 cm. This could represent an abscess or septic tenosynovitis. It is also adjacent to a small erosive lesion involving the superficial aspect of the medial malleolus. Could not exclude osteomyelitis. Laterally there is a smaller fluid collection anterior to the fibula measuring 18 mm on image 221/9. More inferiorly below the lateral malleolus there is a second complex fluid collection which measures a maximum 2.7 cm. This could be an abscess or septic tenosynovitis associated with the  peroneal tendons. There is also an ankle joint effusion but no obvious findings for septic arthritis. Erosive changes along the deep cortex of the fibula at the level of the tibiofibular syndesmosis. Indeterminate finding. Significant fatty atrophy of the calf musculature. Suspect myofasciitis without definite pyomyositis. Advanced vascular calcifications. IMPRESSION: 1. Severe cellulitis and probable myofasciitis without definite pyomyositis. 2. Multiple complex fluid collections surrounding the medial and lateral aspects of the ankle as described above. These are suspicious for abscesses or septic tenosynovitis. 3. Erosive changes along the deep cortex of the fibula at the level of the tibiofibular syndesmosis. Indeterminate finding. 4. Erosive changes along the superficial cortex of the medial malleolus suspicious for osteomyelitis. 5. Small ankle joint effusion but no obvious findings for septic arthritis. 6. MRI without and with contrast (if possible) is recommended for further evaluation of this complex process. Electronically Signed   By: PMarijo SanesM.D.   On: 10/02/2022 19:40   DG Tibia/Fibula Right  Result Date: 10/02/2022 CLINICAL DATA:  Right leg pain swelling and blisters. Rule out soft tissue gas. EXAM: RIGHT TIBIA AND FIBULA - 2 VIEW COMPARISON:  None Available. FINDINGS: Cortical margins of the tibia and fibula are intact. No fracture, erosion, or bony destruction. Degenerative change of the knee and to a lesser extent ankle. Diffuse subcutaneous edema. Prominent vascular calcifications. There also multiple soft tissue calcifications. No soft tissue gas or radiopaque foreign body. IMPRESSION: 1. Diffuse subcutaneous edema. No soft tissue gas or radiopaque foreign body. 2. No evidence of osteomyelitis. 3. Prominent vascular calcifications. Multiple small rounded soft tissue calcifications, nonspecific. Electronically Signed   By: MKeith RakeM.D.   On: 10/02/2022 17:45  US  RENAL  Result Date: 09/26/2022 CLINICAL DATA:  Acute kidney injury EXAM: RENAL / URINARY TRACT ULTRASOUND COMPLETE COMPARISON:  None available FINDINGS: Right Kidney: Renal measurements: 12.9 x 6.8 x 7.0 cm = volume: 323 mL. Echogenicity within normal limits. No mass or hydronephrosis visualized. Left Kidney: Renal measurements: 12.2 x 6.3 x 6.6 cm = volume: 265 mL. Echogenicity within normal limits. No mass or hydronephrosis visualized. Bladder: Unable to evaluate due to collapse configuration. Other: None. IMPRESSION: No significant sonographic abnormality of the kidneys. Electronically Signed   By: Miachel Roux M.D.   On: 09/26/2022 11:14   US Venous Img Lower Unilateral Right (DVT)  Result Date: 09/26/2022 CLINICAL DATA:  Leg swelling EXAM: RIGHT LOWER EXTREMITY VENOUS DOPPLER ULTRASOUND TECHNIQUE: Gray-scale sonography with compression, as well as color and duplex ultrasound, were performed to evaluate the deep venous system(s) from the level of the common femoral vein through the popliteal and proximal calf veins. COMPARISON:  04/25/2022 FINDINGS: VENOUS Normal compressibility of the common femoral, superficial femoral, and popliteal veins, as well as the visualized calf veins. Visualized portions of profunda femoral vein and great saphenous vein unremarkable. No filling defects to suggest DVT on grayscale or color Doppler imaging. Doppler waveforms show normal direction of venous flow, normal respiratory plasticity and response to augmentation. Limited views of the contralateral common femoral vein are unremarkable. OTHER None. Limitations: none IMPRESSION: No right lower extremity DVT Electronically Signed   By: Miachel Roux M.D.   On: 09/26/2022 08:47   DG Chest Port 1 View  Result Date: 09/25/2022 CLINICAL DATA:  Sepsis, elevated white blood cell count, lower extremity erythema and swelling EXAM: PORTABLE CHEST 1 VIEW COMPARISON:  04/22/2022 FINDINGS: Single frontal view of the chest was  obtained in lordotic positioning. Cardiac silhouette is unremarkable. Postsurgical changes from median sternotomy. No airspace disease, effusion, or pneumothorax. No acute bony abnormalities. IMPRESSION: 1. No acute intrathoracic process. Electronically Signed   By: Randa Ngo M.D.   On: 09/25/2022 17:26     TODAY-DAY OF DISCHARGE:  Subjective:   Shane Chambers today has no headache,no chest abdominal pain,no new weakness tingling or numbness, feels much better wants to go home today.   Objective:   Blood pressure (!) 147/75, pulse 66, temperature 97.6 F (36.4 C), temperature source Oral, resp. rate 18, height 5' 9.02" (1.753 m), weight 117.5 kg, SpO2 95 %.  Intake/Output Summary (Last 24 hours) at 10/17/2022 1115 Last data filed at 10/17/2022 0900 Gross per 24 hour  Intake 1358.83 ml  Output 1900 ml  Net -541.17 ml   Filed Weights   10/05/22 1026 10/15/22 0600  Weight: 118 kg 117.5 kg    Exam: Awake Alert, Oriented *3, No new F.N deficits, Normal affect [place].AT,PERRAL Supple Neck,No JVD, No cervical lymphadenopathy appriciated.  Symmetrical Chest wall movement, Good air movement bilaterally, CTAB RRR,No Gallops,Rubs or new Murmurs, No Parasternal Heave +ve B.Sounds, Abd Soft, Non tender, No organomegaly appriciated, No rebound -guarding or rigidity. No Cyanosis, Clubbing or edema, No new Rash or bruise   PERTINENT RADIOLOGIC STUDIES: No results found.   PERTINENT LAB RESULTS: CBC: Recent Labs    10/16/22 0630 10/17/22 0725  WBC 12.7* 11.1*  HGB 6.9* 8.7*  HCT 22.3* 28.3*  PLT 312 330   CMET CMP     Component Value Date/Time   NA 139 10/16/2022 0630   K 4.8 10/16/2022 0630   CL 107 10/16/2022 0630   CO2 24 10/16/2022 0630   GLUCOSE 191 (H) 10/16/2022  0630   BUN 28 (H) 10/16/2022 0630   CREATININE 1.17 10/16/2022 0630   CALCIUM 8.7 (L) 10/16/2022 0630   PROT 6.6 10/12/2022 0030   ALBUMIN 2.1 (L) 10/12/2022 0030   AST 25 10/12/2022 0030   ALT 19  10/12/2022 0030   ALKPHOS 110 10/12/2022 0030   BILITOT <0.1 (L) 10/12/2022 0030   GFRNONAA >60 10/16/2022 0630   GFRAA >60 08/07/2020 0255    GFR Estimated Creatinine Clearance: 86 mL/min (by C-G formula based on SCr of 1.17 mg/dL). No results for input(s): "LIPASE", "AMYLASE" in the last 72 hours. No results for input(s): "CKTOTAL", "CKMB", "CKMBINDEX", "TROPONINI" in the last 72 hours. Invalid input(s): "POCBNP" No results for input(s): "DDIMER" in the last 72 hours. No results for input(s): "HGBA1C" in the last 72 hours. No results for input(s): "CHOL", "HDL", "LDLCALC", "TRIG", "CHOLHDL", "LDLDIRECT" in the last 72 hours. No results for input(s): "TSH", "T4TOTAL", "T3FREE", "THYROIDAB" in the last 72 hours.  Invalid input(s): "FREET3" No results for input(s): "VITAMINB12", "FOLATE", "FERRITIN", "TIBC", "IRON", "RETICCTPCT" in the last 72 hours. Coags: No results for input(s): "INR" in the last 72 hours.  Invalid input(s): "PT" Microbiology: Recent Results (from the past 240 hour(s))  Culture, blood (Routine X 2) w Reflex to ID Panel     Status: None   Collection Time: 10/09/22  2:23 PM   Specimen: BLOOD RIGHT HAND  Result Value Ref Range Status   Specimen Description BLOOD RIGHT HAND  Final   Special Requests   Final    BOTTLES DRAWN AEROBIC AND ANAEROBIC Blood Culture adequate volume   Culture   Final    NO GROWTH 5 DAYS Performed at [facility], [address], [place], [place] [address]    Report Status 10/14/2022 FINAL  Final  Culture, blood (Routine X 2) w Reflex to ID Panel     Status: None   Collection Time: 10/09/22  2:31 PM   Specimen: BLOOD RIGHT HAND  Result Value Ref Range Status   Specimen Description BLOOD RIGHT HAND  Final   Special Requests   Final    BOTTLES DRAWN AEROBIC AND ANAEROBIC Blood Culture adequate volume   Culture   Final    NO GROWTH 5 DAYS Performed at [facility], [place] [address], [place], [place] [address]    Report  Status 10/14/2022 FINAL  Final    FURTHER DISCHARGE INSTRUCTIONS:  Get Medicines reviewed and adjusted: Please take all your medications with you for your next visit with your Primary MD  Laboratory/radiological data: Please request your Primary MD to go over all hospital tests and procedure/radiological results at the follow up, please ask your Primary MD to get all Hospital records sent to his/her office.  In some cases, they will be blood work, cultures and biopsy results pending at the time of your discharge. Please request that your primary care M.D. goes through all the records of your hospital data and follows up on these results.  Also Note the following: If you experience worsening of your admission symptoms, develop shortness of breath, life threatening emergency, suicidal or homicidal thoughts you must seek medical attention immediately by calling 911 or calling your MD immediately  if symptoms less severe.  You must read complete instructions/literature along with all the possible adverse reactions/side effects for all the Medicines you take and that have been prescribed to you. Take any new Medicines after you have completely understood and accpet all the possible adverse reactions/side effects.  Do not drive when taking Pain medications or sleeping medications (Benzodaizepines)  Do not take more than prescribed Pain, Sleep and Anxiety Medications. It is not advisable to combine anxiety,sleep and pain medications without talking with your primary care practitioner  Special Instructions: If you have smoked or chewed Tobacco  in the last 2 yrs please stop smoking, stop any regular Alcohol  and or any Recreational drug use.  Wear Seat belts while driving.  Please note: You were cared for by a hospitalist during your hospital stay. Once you are discharged, your primary care physician will handle any further medical issues. Please note that NO REFILLS for any discharge medications  will be authorized once you are discharged, as it is imperative that you return to your primary care physician (or establish a relationship with a primary care physician if you do not have one) for your post hospital discharge needs so that they can reassess your need for medications and monitor your lab values.  Total Time spent coordinating discharge including counseling, education and face to face time equals greater than 30 minutes.  SignedOren Binet 10/17/2022 11:15 AM

## 2022-10-17 NOTE — Progress Notes (Addendum)
   10/17/22 1400  Clinical Encounter Type  Visited With Patient  Visit Type Initial;Spiritual support  Referral From Physician Rolanda Lundborg, MD)  Consult/Referral To Chaplain Melvenia Beam)  Spiritual Encounters  Spiritual Needs Emotional   Chaplain responded to Spiritual Care Consultation: "Patient would like chaplain consult for further assistance as he navigates his complex medical course." Chaplain met with Shane Chambers. Shane Chambers at patient's bedside. Patient presents with confusion and significant memory loss. Patient was not able to speak simple sentences and drifted completely out of conversation. Patient was unable to maintain thought or answer simple questions such as: "Who he lives with" / "Mother's name" / "Does he have siblings" / "Father's name."  Possibly associated with recent diagnosis of Severe Encephalopathy. Chaplain will attempt visit when patient is more alert. Total time with patient 35 minutes.  [address] [place], [place]., [phone]

## 2022-10-17 NOTE — Progress Notes (Signed)
Occupational Therapy Treatment Patient Details Name: Shane Chambers MRN: [id number] DOB: [birth year] Today's Date: 10/17/2022   History of present illness Pt is a 61 yo male admitted after recent hospitalization for cellulitis from 11/16-11/22. Pt sent home with antibiotics but returned with leg pain.  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 CT scan with no acute abnomality.  PMH: DM, CAD, CABG, COPD, HTN, PVD, AKI.   OT comments  Pt more alert and participatory today although remains confused. Pt with significant cognitive impairment in all areas. Pt with word finding difficulties, perseveration in chewing when eating and significant problem solving deficits during adls. Pt remains max assist x2 for most mobility and has been unable to tolerate lateral scoot tranfers.  Rec ST consult for cognition and to possibly look at swallowing/feeding.  Will continue to see and focus on EOB tasks. Pt comes from LTC/SNF and should be able to access PT/OT when d/c'd there.   Recommendations for follow up therapy are one component of a multi-disciplinary discharge planning process, led by the attending physician.  Recommendations may be updated based on patient status, additional functional criteria and insurance authorization.    Follow Up Recommendations  Skilled nursing-short term rehab (<3 hours/day)     Assistance Recommended at Discharge Frequent or constant Supervision/Assistance  Patient can return home with the following  Two people to help with walking and/or transfers;Two people to help with bathing/dressing/bathroom;Assistance with cooking/housework;Direct supervision/assist for medications management;Assist for transportation;Help with stairs or ramp for entrance;Direct supervision/assist for financial management   Equipment Recommendations  None recommended by OT    Recommendations for Other Services Speech consult    Precautions /  Restrictions Precautions Precautions: Fall Required Braces or Orthoses: Other Brace Splint/Cast: For R BKA Other Brace: limb protector R residual limb Restrictions Weight Bearing Restrictions: Yes RLE Weight Bearing: Non weight bearing       Mobility Bed Mobility               General bed mobility comments: remained in bed for feeding, grooming and UE exercises    Transfers                   General transfer comment: did not transfer today     Balance Overall balance assessment: Needs assistance                                         ADL either performed or assessed with clinical judgement   ADL Overall ADL's : Needs assistance/impaired Eating/Feeding: Moderate assistance;Sitting Eating/Feeding Details (indicate cue type and reason): cues to take bites and cues to actually swallow. Pt would chew food for extended time and would swallow but only when commanded to do so.  Pt drank from cup with cues. Pt very slow to process. Grooming: Wash/dry hands;Wash/dry face;Oral care;Minimal assistance;Sitting Grooming Details (indicate cue type and reason): Pt washed face and hands when wash cloth handed to him.  Pt brushed teeth with assist for set up and cues to initiate.                             Functional mobility during ADLs: Maximal assistance General ADL Comments: Pt with increased participation today although continues to remain cognitively impaired.  Pt has difficulty finding words in conversation  and decreased STM/ attn span and perseverates on certain words.    Extremity/Trunk Assessment Upper Extremity Assessment Upper Extremity Assessment: [name] deficits/detail;LUE deficits/detail [name] Deficits / Details: presents with full movement and strenght. Grip pattern unusual [name] Sensation: WNL [name] Coordination: decreased fine motor LUE Deficits / Details: Pt reports pain in elbow with passive flexion/extension. Unable to tolerate full  pasive ROM. Strength WFL. LUE Sensation: WNL LUE Coordination: decreased fine motor            Vision   Vision Assessment?: Yes Eye Alignment: Within Functional Limits Alignment/Gaze Preference: Within Defined Limits Tracking/Visual Pursuits: Able to track stimulus in all quads without difficulty Convergence: Within functional limits Visual Fields: No apparent deficits Additional Comments: Vision appears grossly intach finding stated items in room on L and R.  Pt unable to read clock on wall. Pt does not recall if he wears glasses. When clock brought closer, pt able to read.   Perception     Praxis      Cognition Arousal/Alertness: Awake/alert Behavior During Therapy: Flat affect Overall Cognitive Status: Impaired/Different from baseline Area of Impairment: Orientation, Attention, Memory, Following commands, Safety/judgement, Awareness, Problem solving                 Orientation Level: Disoriented to, Time Current Attention Level: Sustained Memory: Decreased short-term memory Following Commands: Follows one step commands with increased time, Follows multi-step commands inconsistently Safety/Judgement: Decreased awareness of safety, Decreased awareness of deficits Awareness: Intellectual Problem Solving: Slow processing, Decreased initiation, Requires tactile cues, Requires verbal cues, Difficulty sequencing General Comments: word finding difficulty        Exercises Exercises: Other exercises General Exercises - Upper Extremity Shoulder Flexion: Right, AAROM, Left, PROM, 5 reps, Supine Elbow Flexion: Right, AAROM, 5 reps, Supine Elbow Extension: AAROM, Right, 5 reps, Supine Wrist Flexion: Left, PROM, 5 reps, Supine Wrist Extension: PROM, Left, 5 reps, Supine Composite Extension: PROM, Left, 5 reps, Supine    Shoulder Instructions       General Comments Pt much more responsive today and following most basic commands which made participation much better. Pt  remains confused and reports being very fatigued today and feeling SOB.    Pertinent Vitals/ Pain       Pain Assessment Pain Assessment: Faces Faces Pain Scale: Hurts little more Pain Location: left elbow with mobility Pain Descriptors / Indicators: Grimacing, Discomfort Pain Intervention(s): Monitored during session  Home Living                                          Prior Functioning/Environment              Frequency  Min 2X/week        Progress Toward Goals  OT Goals(current goals can now be found in the care plan section)  Progress towards OT goals: Progressing toward goals  Acute Rehab OT Goals Patient Stated Goal: to walk OT Goal Formulation: With patient Time For Goal Achievement: 10/22/22 Potential to Achieve Goals: Fair ADL Goals Pt Will Perform Grooming: with supervision;sitting Pt Will Perform Upper Body Bathing: with set-up;sitting Pt Will Perform Lower Body Bathing: with set-up;bed level Pt Will Transfer to Toilet: anterior/posterior transfer;bedside commode;with mod assist Additional ADL Goal #1: Pt will follow 100% of commands to increase participation in adls.  Plan Discharge plan remains appropriate;Frequency remains appropriate    Co-evaluation  AM-PAC OT "6 Clicks" Daily Activity     Outcome Measure   Help from another person eating meals?: A Lot Help from another person taking care of personal grooming?: A Little Help from another person toileting, which includes using toliet, bedpan, or urinal?: Total Help from another person bathing (including washing, rinsing, drying)?: A Lot Help from another person to put on and taking off regular upper body clothing?: Total Help from another person to put on and taking off regular lower body clothing?: Total 6 Click Score: 10    End of Session    OT Visit Diagnosis: Unsteadiness on feet (R26.81);Muscle weakness (generalized) (M62.81)   Activity  Tolerance Patient limited by fatigue   Patient Left in bed;with call bell/phone within reach;with bed alarm set   Nurse Communication Mobility status        Time: 5361-4431 OT Time Calculation (min): 26 min  Charges: OT General Charges $OT Visit: 1 Visit OT Treatments $Self Care/Home Management : 23-37 mins   Glenford Peers 10/17/2022, 10:56 AM

## 2022-10-18 NOTE — Progress Notes (Unsigned)
Cardiology Office Note:    Date:  10/18/2022   ID:  Shane Chambers, DOB 04/28/1961, MRN [id number]  PCP:  Caprice Renshaw, [place] Providers Cardiologist:  Carlyle Dolly, MD { Click to update primary MD,subspecialty MD or APP then REFRESH:1}    Referring MD: Caprice Renshaw, MD   No chief complaint on file. ***  History of Present Illness:    Shane Chambers is a 61 y.o. male with a hx of ***  Past Medical History:  Diagnosis Date   Anemia    Arthritis    CAD (coronary artery disease)    a. s/p CABG in 03/2020 with LIMA-LAD, RIMA-PL, RA-D1-RI-OM1   Cancer (HCC)    rectal   Cellulitis    COPD (chronic obstructive pulmonary disease) ([place])    Depression    Diabetes mellitus without complication (HCC)    GERD (gastroesophageal reflux disease)    History of kidney stones    Hyperlipidemia 12/01/2019   Hypertension    Hypothyroidism    Ischemic cardiomyopathy    a. EF 20-25% by echo in 02/2020 b. at 40% by echo in 03/2020 c. EF normalized to 60-65% by echo in 04/2020   Myocardial infarction [facility])    Peripheral vascular disease ([place])    Sleep apnea    Type 2 diabetes mellitus ([place])     Past Surgical History:  Procedure Laterality Date   AMPUTATION Right 10/05/2022   Procedure: AMPUTATION BELOW KNEE;  Surgeon: Newt Minion, MD;  Location: [facility];  Service: Orthopedics;  Laterality: Right;   APPLICATION OF WOUND VAC Right 10/05/2022   Procedure: APPLICATION OF WOUND VAC;  Surgeon: Newt Minion, MD;  Location: [facility];  Service: Orthopedics;  Laterality: Right;   BIOPSY  07/18/2021   Procedure: BIOPSY;  Surgeon: Eloise Harman, DO;  [facility];  Service: Endoscopy;;   BIOPSY  01/09/2022   Procedure: BIOPSY;  Surgeon: Eloise Harman, DO;  [facility];  Service: Endoscopy;;   COLONOSCOPY WITH PROPOFOL N/A 07/18/2021   Carver: 15 millimeter polyp removed from the sigmoid colon, 5 mm polyp removed from sigmoid colon.  Nonbleeding  internal hemorrhoids.  Significant looping of the colon. sigmoid path showed invasive colonic adenocarcinoma involving tubular adenoma (invades to depth of 39m, carcinoma 122mfrom margin, no lymphovascular invasion, no poorly differentiated component.   CORONARY ARTERY BYPASS GRAFT N/A 03/13/2020   Procedure: CORONARY ARTERY BYPASS GRAFTING (CABG) times five using bilateral Internal mammary arteries and left radial artery;  Surgeon: AtWonda OldsMD;  [facility] Service: Open Heart Surgery;  Laterality: N/A;   DENTAL SURGERY     ESOPHAGOGASTRODUODENOSCOPY (EGD) WITH PROPOFOL N/A 07/18/2021   Carver: 1 gastric polyp status post biopsy, gastritis. gastric bx with slight chronic inflammation and no H.pyori. GEJ polypectomy with mild inflammation only   FLEXIBLE SIGMOIDOSCOPY N/A 08/26/2021   Carver: Nonbleeding internal hemorrhoids.  15 mm ulcers from previous polypectomy found in the rectum.  No evidence of previous polyp.  Located 5 to 8 cm from anal verge.   FLEXIBLE SIGMOIDOSCOPY N/A 01/09/2022   Procedure: FLEXIBLE SIGMOIDOSCOPY;  Surgeon: CaEloise HarmanDO;  [facility];  Service: Endoscopy;  Laterality: N/A;   POLYPECTOMY  07/18/2021   Procedure: POLYPECTOMY INTESTINAL;  Surgeon: CaEloise HarmanDO;  [facility];  Service: Endoscopy;;   RADIAL ARTERY HARVEST Left 03/13/2020   Procedure: RADIAL ARTERY HARVEST,;  Surgeon: AtWonda Olds  MD;  [facility];  Service: Open Heart Surgery;  Laterality: Left;   RIGHT/LEFT HEART CATH AND CORONARY ANGIOGRAPHY N/A 03/07/2020   Procedure: RIGHT/LEFT HEART CATH AND CORONARY ANGIOGRAPHY;  Surgeon: Sherren Mocha, MD;  Location: [facility];  Service: Cardiovascular;  Laterality: N/A;   SUBMUCOSAL LIFTING INJECTION  01/09/2022   Procedure: SUBMUCOSAL LIFTING INJECTION;  Surgeon: Eloise Harman, DO;  [facility];  Service: Endoscopy;;   SUBMUCOSAL TATTOO INJECTION  01/09/2022   Procedure:  SUBMUCOSAL TATTOO INJECTION;  Surgeon: Eloise Harman, DO;  [facility];  Service: Endoscopy;;   TEE WITHOUT CARDIOVERSION N/A 03/13/2020   Procedure: TRANSESOPHAGEAL ECHOCARDIOGRAM (TEE);  Surgeon: [name], MD;  Location: [facility];  Service: Open Heart Surgery;  Laterality: N/A;    Current Medications: No outpatient medications have been marked as taking for the 10/20/22 encounter (Appointment) with Shane Bud, NP.     Allergies:   Ace inhibitors, Haloperidol and related, Other, Shellfish allergy, and Penicillins   Social History   Socioeconomic History   Marital status: Single    Spouse name: Not on file   Number of children: Not on file   Years of education: Not on file   Highest education level: Not on file  Occupational History   Not on file  Tobacco Use   Smoking status: Former    Packs/day: 1.50    Types: Cigarettes    Quit date: 05/25/1995    Years since quitting: 27.4   Smokeless tobacco: Never  Vaping Use   Vaping Use: Never used  Substance and Sexual Activity   Alcohol use: Not Currently    Comment: rarely   Drug use: No   Sexual activity: Not on file  Other Topics Concern   Not on file  Social History Narrative   Not on file   Social Determinants of Health   Financial Resource Strain: Not on file  Food Insecurity: No Food Insecurity (09/26/2022)   Hunger Vital Sign    Worried About Running Out of Food in the Last Year: Never true    Ran Out of Food in the Last Year: Never true  Transportation Needs: No Transportation Needs (09/26/2022)   PRAPARE - Hydrologist (Medical): No    Lack of Transportation (Non-Medical): No  Physical Activity: Not on file  Stress: Not on file  Social Connections: Not on file     Family History: The patient's ***family history includes Hypertension in his mother.  ROS:   Please see the history of present illness.    *** All other systems reviewed and are  negative.  EKGs/Labs/Other Studies Reviewed:    The following studies were reviewed today: ***  EKG:  EKG is *** ordered today.  The ekg ordered today demonstrates ***  Recent Labs: 10/12/2022: ALT 19 10/16/2022: BUN 28; Creatinine, Ser 1.17; Magnesium 1.9; Potassium 4.8; Sodium 139 10/17/2022: Hemoglobin 8.7; Platelets 330  Recent Lipid Panel    Component Value Date/Time   CHOL 140 08/02/2020 0428   TRIG 85 08/02/2020 0428   HDL 41 08/02/2020 0428   CHOLHDL 3.4 08/02/2020 0428   VLDL 17 08/02/2020 0428   LDLCALC 82 08/02/2020 0428     Risk Assessment/Calculations:   {Does this patient have ATRIAL FIBRILLATION?:[id number]}            Physical Exam:    VS:  There were no vitals taken for this visit.    Wt Readings from Last  3 Encounters:  10/15/22 259 lb 0.7 oz (117.5 kg)  10/01/22 260 lb 2.3 oz (118 kg)  09/23/22 255 lb 14.4 oz (116.1 kg)     GEN: *** Well nourished, well developed in no acute distress HEENT: Normal NECK: No JVD; No carotid bruits LYMPHATICS: No lymphadenopathy CARDIAC: ***RRR, no murmurs, rubs, gallops RESPIRATORY:  Clear to auscultation without rales, wheezing or rhonchi  ABDOMEN: Soft, non-tender, non-distended MUSCULOSKELETAL:  No edema; No deformity  SKIN: Warm and dry NEUROLOGIC:  Alert and oriented x 3 PSYCHIATRIC:  Normal affect   ASSESSMENT:    No diagnosis found. PLAN:    In order of problems listed above:  ***      {Are you ordering a CV Procedure (e.g. stress test, cath, DCCV, TEE, etc)?   Press F2        :[id number]}    Medication Adjustments/Labs and Tests Ordered: Current medicines are reviewed at length with the patient today.  Concerns regarding medicines are outlined above.  No orders of the defined types were placed in this encounter.  No orders of the defined types were placed in this encounter.   There are no Patient Instructions on file for this visit.   SignedFinis Bud, NP  10/18/2022 12:05 PM     [place]

## 2022-10-20 ENCOUNTER — Encounter: Payer: Self-pay | Admitting: Nurse Practitioner

## 2022-10-20 ENCOUNTER — Ambulatory Visit: Payer: Medicare Other | Attending: Nurse Practitioner | Admitting: Nurse Practitioner

## 2022-10-20 VITALS — BP 138/80 | HR 79 | Ht 69.0 in

## 2022-10-20 DIAGNOSIS — Z89511 Acquired absence of right leg below knee: Secondary | ICD-10-CM | POA: Diagnosis present

## 2022-10-20 DIAGNOSIS — Z79899 Other long term (current) drug therapy: Secondary | ICD-10-CM | POA: Diagnosis not present

## 2022-10-20 DIAGNOSIS — R9431 Abnormal electrocardiogram [ECG] [EKG]: Secondary | ICD-10-CM | POA: Diagnosis present

## 2022-10-20 DIAGNOSIS — J449 Chronic obstructive pulmonary disease, unspecified: Secondary | ICD-10-CM | POA: Insufficient documentation

## 2022-10-20 DIAGNOSIS — N179 Acute kidney failure, unspecified: Secondary | ICD-10-CM | POA: Diagnosis present

## 2022-10-20 DIAGNOSIS — E782 Mixed hyperlipidemia: Secondary | ICD-10-CM | POA: Insufficient documentation

## 2022-10-20 DIAGNOSIS — I350 Nonrheumatic aortic (valve) stenosis: Secondary | ICD-10-CM | POA: Insufficient documentation

## 2022-10-20 DIAGNOSIS — I4721 Torsades de pointes: Secondary | ICD-10-CM | POA: Insufficient documentation

## 2022-10-20 DIAGNOSIS — I2510 Atherosclerotic heart disease of native coronary artery without angina pectoris: Secondary | ICD-10-CM | POA: Diagnosis not present

## 2022-10-20 DIAGNOSIS — I5032 Chronic diastolic (congestive) heart failure: Secondary | ICD-10-CM | POA: Insufficient documentation

## 2022-10-20 NOTE — Patient Instructions (Signed)
Medication Instructions:  Continue all current medications.  Labwork: CMET, CBC, Mg, FLP, BNP  Will have done at nursing facility.  Office will contact with results via phone, letter or mychart.     Testing/Procedures: none  Follow-Up: 3-4 months   Any Other Special Instructions Will Be Listed Below (If Applicable).   If you need a refill on your cardiac medications before your next appointment, please call your pharmacy.

## 2022-10-21 ENCOUNTER — Encounter (HOSPITAL_COMMUNITY): Payer: Self-pay | Admitting: Hematology

## 2022-10-23 ENCOUNTER — Ambulatory Visit (INDEPENDENT_AMBULATORY_CARE_PROVIDER_SITE_OTHER): Payer: Medicare Other | Admitting: Orthopedic Surgery

## 2022-10-23 DIAGNOSIS — Z89511 Acquired absence of right leg below knee: Secondary | ICD-10-CM

## 2022-10-26 ENCOUNTER — Encounter: Payer: Self-pay | Admitting: Orthopedic Surgery

## 2022-10-26 NOTE — Progress Notes (Signed)
Office Visit Note   Patient: Shane Chambers           Date of Birth: [birth year]           MRN: [id number] Visit Date: 10/23/2022              Requested by: Caprice Renshaw, [place] [address] [place],  [place] [address] PCP: Caprice Renshaw, MD  Chief Complaint  Patient presents with   Right Leg - Routine Post Op    10/05/2022 right BKA kerecis graft       HPI: Patient is a 61 year old gentleman who is 3 weeks status post right transtibial amputation.  Patient states he has decreased p.o. intake since surgery he states he is lost weight.  Patient states he is not nauseated he just reports a decreased appetite.  Assessment & Plan: Visit Diagnoses:  1. Right below-knee amputee ([place])     Plan: Recommended protein supplements 3 times a day.  Continue Dial soap cleansing and compression.  Follow-up in 1 week to remove the staples.  Follow-Up Instructions: Return in about 1 week (around 10/30/2022).   Ortho Exam  Patient is alert, oriented, no adenopathy, well-dressed, normal affect, normal respiratory effort. Examination there is some swelling with mild gaping of her incision.  There is no cellulitis no drainage no signs of infection.  Imaging: No results found.    Labs: Lab Results  Component Value Date   HGBA1C 5.6 09/26/2022   HGBA1C 11.1 (H) 11/23/2020   HGBA1C 7.3 (H) 08/01/2020   ESRSEDRATE >140 (H) 10/02/2022   CRP 33.7 (H) 10/02/2022   CRP 33.6 (H) 04/28/2022   CRP 36.5 (H) 04/26/2022   REPTSTATUS 10/14/2022 FINAL 10/09/2022   GRAMSTAIN  08/01/2020    CYTOSPIN SMEAR NO ORGANISMS SEEN WBC PRESENT,BOTH PMN AND MONONUCLEAR Performed at [facility], [address], [place], [place] [address]    CULT  10/09/2022    NO GROWTH 5 DAYS Performed at [facility], [place] [address], [place], [place] [address]    LABORGA KLEBSIELLA PNEUMONIAE (A) 10/19/2021     Lab Results  Component Value Date   ALBUMIN 2.1 (L) 10/12/2022   ALBUMIN 2.1 (L) 10/07/2022   ALBUMIN 2.0  (L) 10/03/2022    Lab Results  Component Value Date   MG 1.9 10/16/2022   MG 2.0 10/15/2022   MG 2.1 10/14/2022   Lab Results  Component Value Date   VD25OH 8.61 (L) 12/01/2019    No results found for: "PREALBUMIN"    Latest Ref Rng & Units 10/17/2022    7:25 AM 10/16/2022    6:30 AM 10/15/2022    3:56 AM  CBC EXTENDED  WBC 4.0 - 10.5 K/uL 11.1  12.7  15.6   RBC 4.22 - 5.81 MIL/uL 3.04  2.40  2.83   Hemoglobin 13.0 - 17.0 g/dL 8.7  6.9  8.3   HCT 39.0 - 52.0 % 28.3  22.3  25.9   Platelets 150 - 400 K/uL 330  312  327      There is no height or weight on file to calculate BMI.  Orders:  No orders of the defined types were placed in this encounter.  No orders of the defined types were placed in this encounter.    Procedures: No procedures performed  Clinical Data: No additional findings.  ROS:  All other systems negative, except as noted in the HPI. Review of Systems  Objective: Vital Signs: There were no vitals taken for this  visit.  Specialty Comments:  No specialty comments available.  PMFS History: Patient Active Problem List   Diagnosis Date Noted   Acute hypoactive delirium due to another medical condition 10/13/2022   Septic arthritis of right ankle ([place]) 10/04/2022   Cellulitis 10/02/2022   Subacute osteomyelitis, right ankle and foot ([place]) 10/02/2022   Hypothyroidism 10/02/2022   Hypoalbuminemia 10/02/2022   Cellulitis of right lower extremity 09/25/2022   Lactic acidosis 04/23/2022   Anemia of chronic disease 04/23/2022   Obesity, Class III, BMI 40-49.9 (morbid obesity) ([place]) 04/23/2022   AKI (acute kidney injury) ([place]) 04/22/2022   Elevated troponin 04/22/2022   Cancer of sigmoid colon ([place]) 12/16/2021   Constipation 12/16/2021   GERD (gastroesophageal reflux disease) 12/16/2021   Iron deficiency anemia 09/17/2021   Rectal cancer ([place]) 09/03/2021   Bilateral lower leg cellulitis 79/12/4095   Systolic and diastolic CHF, chronic ([place])  11/22/2020   CHF exacerbation ([place]) 08/01/2020   Visit for wound check 05/28/2020   OSA (obstructive sleep apnea) 05/24/2020   Acute exacerbation of chronic obstructive pulmonary disease (COPD) ([place]) 04/29/2020   Leukocytosis 04/29/2020   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   Ischemic cardiomyopathy    Coronary artery disease    Acute on chronic combined systolic and diastolic CHF (congestive heart failure) ([place]) 03/05/2020   Acute hyponatremia 03/05/2020   Severe Vitamin D deficiency 12/02/2019   Hypokalemia 12/01/2019   Hyperlipidemia 12/01/2019   BPH (benign prostatic hyperplasia) 12/01/2019   Normocytic anemia 12/01/2019   Pneumonia due to COVID-19 virus 12/01/2019   Hypoxia    SOB (shortness of breath) 11/16/2019   Severe sepsis ([place]) 11/16/2019   Hypertension    Type 2 diabetes mellitus ([place])    Depression    Acute bronchitis with bronchospasm 11/15/2019   Past Medical History:  Diagnosis Date   Anemia    Arthritis    CAD (coronary artery disease)    a. s/p CABG in 03/2020 with LIMA-LAD, RIMA-PL, RA-D1-RI-OM1   Cancer ([place])    rectal   Cellulitis    COPD (chronic obstructive pulmonary disease) ([place])    Depression    Diabetes mellitus without complication ([place])    GERD (gastroesophageal reflux disease)    History of kidney stones    Hyperlipidemia 12/01/2019   Hypertension    Hypothyroidism    Ischemic cardiomyopathy    a. EF 20-25% by echo in 02/2020 b. at 40% by echo in 03/2020 c. EF normalized to 60-65% by echo in 04/2020   Myocardial infarction [facility])    Peripheral vascular disease ([place])    Sleep apnea    Type 2 diabetes mellitus ([place])     Family History  Problem Relation Age of Onset   Hypertension Mother     Past Surgical History:  Procedure Laterality Date   AMPUTATION Right 10/05/2022   Procedure: AMPUTATION BELOW KNEE;  Surgeon: Newt Minion, MD;  Location: [place];  Service: Orthopedics;  Laterality: Right;   APPLICATION OF WOUND VAC Right 10/05/2022    Procedure: APPLICATION OF WOUND VAC;  Surgeon: Newt Minion, MD;  Location: [facility];  Service: Orthopedics;  Laterality: Right;   BIOPSY  07/18/2021   Procedure: BIOPSY;  Surgeon: Eloise Harman, DO;  [facility];  Service: Endoscopy;;   BIOPSY  01/09/2022   Procedure: BIOPSY;  Surgeon: Eloise Harman, DO;  [facility];  Service: Endoscopy;;   COLONOSCOPY WITH PROPOFOL N/A 07/18/2021   Carver: 15 millimeter polyp removed from the  sigmoid colon, 5 mm polyp removed from sigmoid colon.  Nonbleeding internal hemorrhoids.  Significant looping of the colon. sigmoid path showed invasive colonic adenocarcinoma involving tubular adenoma (invades to depth of 68m, carcinoma 162mfrom margin, no lymphovascular invasion, no poorly differentiated component.   CORONARY ARTERY BYPASS GRAFT N/A 03/13/2020   Procedure: CORONARY ARTERY BYPASS GRAFTING (CABG) times five using bilateral Internal mammary arteries and left radial artery;  Surgeon: AtWonda OldsMD;  [facility] Service: Open Heart Surgery;  Laterality: N/A;   DENTAL SURGERY     ESOPHAGOGASTRODUODENOSCOPY (EGD) WITH PROPOFOL N/A 07/18/2021   Carver: 1 gastric polyp status post biopsy, gastritis. gastric bx with slight chronic inflammation and no H.pyori. GEJ polypectomy with mild inflammation only   FLEXIBLE SIGMOIDOSCOPY N/A 08/26/2021   Carver: Nonbleeding internal hemorrhoids.  15 mm ulcers from previous polypectomy found in the rectum.  No evidence of previous polyp.  Located 5 to 8 cm from anal verge.   FLEXIBLE SIGMOIDOSCOPY N/A 01/09/2022   Procedure: FLEXIBLE SIGMOIDOSCOPY;  Surgeon: CaEloise HarmanDO;  [facility];  Service: Endoscopy;  Laterality: N/A;   POLYPECTOMY  07/18/2021   Procedure: POLYPECTOMY INTESTINAL;  Surgeon: CaEloise HarmanDO;  [facility];  Service: Endoscopy;;   RADIAL ARTERY HARVEST Left 03/13/2020   Procedure: [place]  Surgeon: AtWonda OldsMD;  [facility] Service: Open Heart Surgery;  Laterality: Left;   RIGHT/LEFT HEART CATH AND CORONARY ANGIOGRAPHY N/A 03/07/2020   Procedure: RIGHT/LEFT HEART CATH AND CORONARY ANGIOGRAPHY;  Surgeon: CoSherren MochaMD;  [facility];  Service: Cardiovascular;  Laterality: N/A;   SUBMUCOSAL LIFTING INJECTION  01/09/2022   Procedure: SUBMUCOSAL LIFTING INJECTION;  Surgeon: CaEloise HarmanDO;  [facility];  Service: Endoscopy;;   SUBMUCOSAL TATTOO INJECTION  01/09/2022   Procedure: SUBMUCOSAL TATTOO INJECTION;  Surgeon: CaEloise HarmanDO;  [facility];  Service: Endoscopy;;   TEE WITHOUT CARDIOVERSION N/A 03/13/2020   Procedure: TRANSESOPHAGEAL ECHOCARDIOGRAM (TEE);  Surgeon: AtWonda OldsMD;  [facility] Service: Open [facility] Surgery;  Laterality: N/A;   Social History   Occupational History   Not on file  Tobacco Use   Smoking status: Former    Packs/day: 1.50    Types: Cigarettes    Quit date: 05/25/1995    Years since quitting: 27.4   Smokeless tobacco: Never  Vaping Use   Vaping Use: Never used  Substance and Sexual Activity   Alcohol use: Not Currently    Comment: rarely   Drug use: No   Sexual activity: Not on file

## 2022-10-27 ENCOUNTER — Telehealth: Payer: Self-pay | Admitting: *Deleted

## 2022-10-27 ENCOUNTER — Other Ambulatory Visit: Payer: Self-pay

## 2022-10-27 ENCOUNTER — Encounter (HOSPITAL_COMMUNITY): Payer: Self-pay

## 2022-10-27 ENCOUNTER — Emergency Department (HOSPITAL_COMMUNITY)
Admission: EM | Admit: 2022-10-27 | Discharge: 2022-11-05 | Disposition: A | Discharge status: Other Acute Inpatient Hospital | Payer: Medicare Other | Attending: Emergency Medicine | Admitting: Emergency Medicine

## 2022-10-27 DIAGNOSIS — F32A Depression, unspecified: Secondary | ICD-10-CM | POA: Diagnosis present

## 2022-10-27 DIAGNOSIS — Z7951 Long term (current) use of inhaled steroids: Secondary | ICD-10-CM | POA: Diagnosis not present

## 2022-10-27 DIAGNOSIS — Z7984 Long term (current) use of oral hypoglycemic drugs: Secondary | ICD-10-CM | POA: Insufficient documentation

## 2022-10-27 DIAGNOSIS — F333 Major depressive disorder, recurrent, severe with psychotic symptoms: Secondary | ICD-10-CM | POA: Diagnosis not present

## 2022-10-27 DIAGNOSIS — Z79899 Other long term (current) drug therapy: Secondary | ICD-10-CM | POA: Insufficient documentation

## 2022-10-27 DIAGNOSIS — I10 Essential (primary) hypertension: Secondary | ICD-10-CM | POA: Diagnosis not present

## 2022-10-27 DIAGNOSIS — I2510 Atherosclerotic heart disease of native coronary artery without angina pectoris: Secondary | ICD-10-CM | POA: Insufficient documentation

## 2022-10-27 DIAGNOSIS — F323 Major depressive disorder, single episode, severe with psychotic features: Secondary | ICD-10-CM | POA: Diagnosis present

## 2022-10-27 DIAGNOSIS — Z794 Long term (current) use of insulin: Secondary | ICD-10-CM | POA: Insufficient documentation

## 2022-10-27 DIAGNOSIS — Z955 Presence of coronary angioplasty implant and graft: Secondary | ICD-10-CM | POA: Diagnosis not present

## 2022-10-27 DIAGNOSIS — E119 Type 2 diabetes mellitus without complications: Secondary | ICD-10-CM | POA: Insufficient documentation

## 2022-10-27 DIAGNOSIS — J449 Chronic obstructive pulmonary disease, unspecified: Secondary | ICD-10-CM | POA: Diagnosis not present

## 2022-10-27 DIAGNOSIS — Z7982 Long term (current) use of aspirin: Secondary | ICD-10-CM | POA: Insufficient documentation

## 2022-10-27 DIAGNOSIS — R45851 Suicidal ideations: Secondary | ICD-10-CM | POA: Diagnosis not present

## 2022-10-27 DIAGNOSIS — Z20822 Contact with and (suspected) exposure to covid-19: Secondary | ICD-10-CM | POA: Insufficient documentation

## 2022-10-27 LAB — CBC WITH DIFFERENTIAL/PLATELET
Abs Immature Granulocytes: 0.13 10*3/uL — ABNORMAL HIGH (ref 0.00–0.07)
Basophils Absolute: 0.1 10*3/uL (ref 0.0–0.1)
Basophils Relative: 1 %
Eosinophils Absolute: 0.5 10*3/uL (ref 0.0–0.5)
Eosinophils Relative: 5 %
HCT: 29.8 % — ABNORMAL LOW (ref 39.0–52.0)
Hemoglobin: 9.3 g/dL — ABNORMAL LOW (ref 13.0–17.0)
Immature Granulocytes: 1 %
Lymphocytes Relative: 22 %
Lymphs Abs: 2.1 10*3/uL (ref 0.7–4.0)
MCH: 28.4 pg (ref 26.0–34.0)
MCHC: 31.2 g/dL (ref 30.0–36.0)
MCV: 91.1 fL (ref 80.0–100.0)
Monocytes Absolute: 0.8 10*3/uL (ref 0.1–1.0)
Monocytes Relative: 9 %
Neutro Abs: 6.2 10*3/uL (ref 1.7–7.7)
Neutrophils Relative %: 62 %
Platelets: 357 10*3/uL (ref 150–400)
RBC: 3.27 MIL/uL — ABNORMAL LOW (ref 4.22–5.81)
RDW: 16 % — ABNORMAL HIGH (ref 11.5–15.5)
WBC: 9.8 10*3/uL (ref 4.0–10.5)
nRBC: 0 % (ref 0.0–0.2)

## 2022-10-27 NOTE — ED Triage Notes (Signed)
Pt from [facility]. Pt told staff he wanted to hurt himself. Pt does not explain to staff or EMS.

## 2022-10-27 NOTE — ED Provider Notes (Signed)
[facility] EMERGENCY DEPARTMENT Provider Note   CSN: [id number] Arrival date & time: 10/27/22  2205     History {Add pertinent medical, surgical, social history, OB history to HPI:1} Chief Complaint  Patient presents with   Suicidal    Shane Chambers is a 61 y.o. male.  Patient is a 61 year old male with past medical history of diabetes, hypertension, coronary artery disease status post CABG, ischemic cardiomyopathy, hyperlipidemia, COPD.  Patient sent from the [facility] where he resides for evaluation of suicidal ideation.  Patient expresses to me that he told someone at the facility he wanted to harm himself and that he wished he was dead.  He reiterates this desire to me.  He denies plan.  The history is provided by the patient.       Home Medications Prior to Admission medications   Medication Sig Start Date End Date Taking? Authorizing Provider  acetaminophen (TYLENOL) 500 MG tablet Take 1,000 mg by mouth every 8 (eight) hours as needed for fever or moderate pain.    [provider]  albuterol (VENTOLIN HFA) 108 (90 Base) MCG/ACT inhaler Inhale 2 puffs into the lungs every 6 (six) hours as needed for wheezing or shortness of breath. 05/02/22   Johnson, Clanford L, MD  alum & mag hydroxide-simeth (MAALOX/MYLANTA) 200-200-20 MG/5ML suspension Take 30 mLs by mouth every 6 (six) hours as needed for indigestion or heartburn. 10/17/22   Ghimire, Henreitta Leber, MD  Amino Acids-Protein Hydrolys (PRO-STAT AWC PO) Take 30 mLs by mouth 2 (two) times daily.    [provider]  amLODipine (NORVASC) 10 MG tablet Take 1 tablet (10 mg total) by mouth daily. 10/18/22   Ghimire, Henreitta Leber, MD  aspirin 81 MG EC tablet Take 1 tablet (81 mg total) by mouth daily with breakfast. 11/29/20   Roxan Hockey, MD  atorvastatin (LIPITOR) 80 MG tablet Take 80 mg by mouth at bedtime.    [provider]  budesonide-formoterol (SYMBICORT) 160-4.5 MCG/ACT inhaler Inhale 2 puffs into  the lungs in the morning and at bedtime. 05/24/20   Rigoberto Noel, MD  Cholecalciferol (VITAMIN D) 50 MCG (2000 UT) tablet Take 2,000 Units by mouth daily.    [provider]  docusate sodium (COLACE) 100 MG capsule Take 100 mg by mouth daily. 09/23/22   [provider]  feeding supplement (ENSURE ENLIVE / ENSURE PLUS) LIQD Take 237 mLs by mouth 3 (three) times daily between meals. 10/17/22   Ghimire, Henreitta Leber, MD  FERROUS GLUCONATE PO Take 325 mg by mouth daily. 09/23/22   [provider]  insulin aspart (NOVOLOG FLEXPEN) 100 UNIT/ML FlexPen 0-15 Units, Subcutaneous, 3 times daily with meals CBG < 70: implement hypoglycemia protocol-call MD CBG 70 - 120: 0 units CBG 121 - 150: 2 units CBG 151 - 200: 3 units CBG 201 - 250: 5 units CBG 251 - 300: 8 units CBG 301 - 350: 11 units CBG 351 - 400: 15 units CBG > 400: 10/17/22   Ghimire, Henreitta Leber, MD  ipratropium-albuterol (DUONEB) 0.5-2.5 (3) MG/3ML SOLN Take 3 mLs by nebulization every 6 (six) hours as needed. 10/17/22   Ghimire, Henreitta Leber, MD  levothyroxine (SYNTHROID) 75 MCG tablet Take 75 mcg by mouth daily before breakfast.    [provider]  loperamide HCl (IMODIUM) 2 MG/15ML solution Take 4 mg by mouth every 6 (six) hours as needed for diarrhea or loose stools.    [provider]  magnesium oxide (MAG-OX) 400 MG tablet  Take 400 mg by mouth daily.    [provider]  metFORMIN (GLUCOPHAGE) 1000 MG tablet Take 1,000 mg by mouth 2 (two) times daily.    [provider]  metoprolol tartrate (LOPRESSOR) 100 MG tablet Take 1 tablet (100 mg total) by mouth 2 (two) times daily. 10/17/22   Ghimire, Henreitta Leber, MD  Multiple Vitamin (MULTIVITAMIN) tablet Take 1 tablet by mouth daily.    [provider]  omeprazole (PRILOSEC) 40 MG capsule Take 40 mg by mouth daily. 01/01/22   [provider]  polyethylene glycol (MIRALAX / GLYCOLAX) 17 g packet Take 17 g by mouth daily as  needed for moderate constipation.    [provider]  polyvinyl alcohol (LIQUIFILM TEARS) 1.4 % ophthalmic solution Place 1 drop into both eyes daily.    [provider]  potassium chloride SA (KLOR-CON M) 20 MEQ tablet Take 1 tablet (20 mEq total) by mouth daily. Patient not taking: Reported on 10/20/2022 05/05/22   Irwin Brakeman L, MD  senna-docusate (SENOKOT-S) 8.6-50 MG tablet Take 1 tablet by mouth 2 (two) times daily. 10/17/22   Ghimire, Henreitta Leber, MD  tamsulosin (FLOMAX) 0.4 MG CAPS capsule Take 0.8 mg by mouth daily.    [provider]  thiamine (VITAMIN B-1) 100 MG tablet Take 1 tablet (100 mg total) by mouth daily. 10/18/22   Ghimire, Henreitta Leber, MD  torsemide (DEMADEX) 20 MG tablet Take 2 tablets (40 mg total) by mouth daily. 10/17/22   Ghimire, Henreitta Leber, MD      Allergies    Ace inhibitors, Haloperidol and related, Other, Shellfish allergy, and Penicillins    Review of Systems   Review of Systems  All other systems reviewed and are negative.   Physical Exam Updated Vital Signs BP (!) 163/64 (BP Location: Left Arm)   Pulse 66   Temp 98.1 F (36.7 C) (Oral)   Resp 18   Ht '5\' 9"'$  (1.753 m)   Wt 117.5 kg   SpO2 96%   BMI 38.25 kg/m  Physical Exam Vitals and nursing note reviewed.  Constitutional:      General: He is not in acute distress.    Appearance: He is well-developed. He is not diaphoretic.  HENT:     Head: Normocephalic and atraumatic.  Cardiovascular:     Rate and Rhythm: Normal rate and regular rhythm.     Heart sounds: No murmur heard.    No friction rub.  Pulmonary:     Effort: Pulmonary effort is normal. No respiratory distress.     Breath sounds: Normal breath sounds. No wheezing or rales.  Abdominal:     General: Bowel sounds are normal. There is no distension.     Palpations: Abdomen is soft.     Tenderness: There is no abdominal tenderness.  Musculoskeletal:        General: Normal range of motion.     Cervical back:  Normal range of motion and neck supple.  Skin:    General: Skin is warm and dry.  Neurological:     Mental Status: He is alert and oriented to person, place, and time.     Coordination: Coordination normal.     ED Results / Procedures / Treatments   Labs (all labs ordered are listed, but only abnormal results are displayed) Labs Reviewed - No data to display  EKG None  Radiology No results found.  Procedures Procedures  {Document cardiac monitor, telemetry assessment procedure when appropriate:1}  Medications Ordered in  ED Medications - No data to display  ED Course/ Medical Decision Making/ A&P                           Medical Decision Making Amount and/or Complexity of Data Reviewed Labs: ordered.   ***  {Document critical care time when appropriate:1} {Document review of labs and clinical decision tools ie heart score, Chads2Vasc2 etc:1}  {Document your independent review of radiology images, and any outside records:1} {Document your discussion with family members, caretakers, and with consultants:1} {Document social determinants of health affecting pt's care:1} {Document your decision making why or why not admission, treatments were needed:1} Final Clinical Impression(s) / ED Diagnoses Final diagnoses:  None    Rx / DC Orders ED Discharge Orders     None

## 2022-10-27 NOTE — Telephone Encounter (Signed)
Laurine Blazer, LPN 90/47/5339  1:79 PM EST Back to Top    Notified Delois (nurse at [facility]) notified, copy to pcp.   Merlene Laughter, RN 10/24/2022  2:30 PM EST     Called/no answer   Finis Bud, NP 10/22/2022  4:03 PM EST     Labwork overall unremarkable. However his Mag is minimally low at 1.6. Let's have him continue Mag from 400 daily, will keep an eye on this, may have to go up if no improvement at next lab check. His proBNP is > 4,000. Let's increase Torsemide to 60 mg daily and keep potassium at current dosage. Will recheck BMET and Mag in 2 weeks.   Thanks!   Finis Bud, AGNP-C

## 2022-10-28 DIAGNOSIS — F32A Depression, unspecified: Secondary | ICD-10-CM | POA: Diagnosis not present

## 2022-10-28 DIAGNOSIS — F333 Major depressive disorder, recurrent, severe with psychotic symptoms: Secondary | ICD-10-CM | POA: Diagnosis not present

## 2022-10-28 LAB — BASIC METABOLIC PANEL
Anion gap: 9 (ref 5–15)
BUN: 22 mg/dL — ABNORMAL HIGH (ref 6–20)
CO2: 24 mmol/L (ref 22–32)
Calcium: 8.5 mg/dL — ABNORMAL LOW (ref 8.9–10.3)
Chloride: 104 mmol/L (ref 98–111)
Creatinine, Ser: 1.16 mg/dL (ref 0.61–1.24)
GFR, Estimated: >60 mL/min (ref >60)
Glucose, Bld: 121 mg/dL — ABNORMAL HIGH (ref 70–99)
Potassium: 4 mmol/L (ref 3.5–5.1)
Sodium: 137 mmol/L (ref 135–145)

## 2022-10-28 LAB — ETHANOL: Alcohol, Ethyl (B): <10 mg/dL (ref <10)

## 2022-10-28 MED ORDER — HYDROXYZINE HCL 25 MG PO TABS
25.0000 mg | ORAL_TABLET | Freq: Three times a day (TID) | ORAL | Status: DC | PRN
  Administered 2022-11-03 – 2022-11-05 (×3): 25 mg via ORAL
  Filled 2022-10-28 (×3): qty 1

## 2022-10-28 MED ORDER — TRAZODONE HCL 50 MG PO TABS
50.0000 mg | ORAL_TABLET | Freq: Every evening | ORAL | Status: DC | PRN
  Administered 2022-10-29 – 2022-11-04 (×4): 50 mg via ORAL
  Filled 2022-10-28 (×4): qty 1

## 2022-10-28 MED ORDER — OXYCODONE-ACETAMINOPHEN 5-325 MG PO TABS
1.0000 | ORAL_TABLET | Freq: Four times a day (QID) | ORAL | Status: DC | PRN
  Administered 2022-10-28 – 2022-11-05 (×13): 1 via ORAL
  Filled 2022-10-28 (×13): qty 1

## 2022-10-28 MED ORDER — CEPHALEXIN 500 MG PO CAPS
500.0000 mg | ORAL_CAPSULE | Freq: Two times a day (BID) | ORAL | Status: DC
  Administered 2022-10-28 – 2022-10-31 (×6): 500 mg via ORAL
  Filled 2022-10-28 (×6): qty 1

## 2022-10-28 MED ORDER — MIRTAZAPINE 15 MG PO TBDP
15.0000 mg | ORAL_TABLET | Freq: Every day | ORAL | Status: DC
  Administered 2022-10-28 – 2022-10-30 (×3): 15 mg via ORAL
  Filled 2022-10-28 (×9): qty 1

## 2022-10-28 NOTE — ED Notes (Signed)
Pt's "man-purse" removed from bedside and placed in [facility] strorage locker #4.
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
## 2022-10-28 NOTE — Consult Note (Signed)
Telepsych Consultation   Reason for Consult: SI Referring Physician: Dr. Stark Jock Location of Patient: [id number] Location of Provider: [place] Department  Patient Identification: Shane Chambers MRN:  [id number] Principal Diagnosis: Depression Diagnosis: SI  Total Time spent with patient: 30 minutes  Subjective:   Shane Chambers is a 61 y.o. male patient admitted for depressive disorder with suicidal ideation.  Assessment: Patient was seen and examined lying on his bed at [facility], ED.  He appears bedridden and unable to focus on this provider.  Alert and oriented x 3.  Speech garbled and decreased in volume.  General appearance bizarre and unkempt.  When asked patient what brought him to the hospital, reports, "I want to die.  I will live at [facility] and I cannot move freely, and cannot walk and sits in a wheelchair all day long." Patient reports positive SI without any plans.  He endorses auditory hallucination of hearing people laughing at him.  He denies HI or VH.  He reports sleeping for  approximately4 hours last night.  Reports decreased appetite.  When asked about safety at [facility], patient reports, "I do not feel safe because of my lack of freedom."  Unable to explain further.  Denies access to firearms, denies self injurious behavior.  He denies drug use, alcohol use or tobacco use.  Denies being followed by psychiatrist.  Reports family history of mental illness with deceased aunt diagnosed with schizophrenia.  Labs reviewed with CMP: Glucose 121 high, BUN 22 high, calcium 8.5 low, otherwise normal.  CBC with differential: Red blood cells 3.27 low, hemoglobin 9.3 low, HCT 29.8 low, RDW 16.0 high and immature granulocyte 0.13, otherwise normal.  ED hospitalist to follow-up with abnormal labs.  Disposition: Based on evaluation of patient, he appears to be at imminent risk to himself.  He meets the criteria for inpatient psychiatric admission.  He is not psych  cleared at this time.  CSW to pursue appropriate inpatient psychiatric placement for patient. Supportive therapy provided on ongoing stressors.  [name], ED treatment team and [name], ED physician made aware of patient disposition.   [name] B Shane Chambers is a 61 y.o. male patient with past medical history of diabetes, hypertension, coronary artery disease status post CABG, ischemic cardiomyopathy, hyperlipidemia, COPD.  Patient sent from the [facility] where he resides for evaluation of suicidal ideation.  Patient expresses to me that he told someone at the facility he wanted to harm himself and that he wished he was dead.  He reiterates this desire to me.  He denies plan.     Past Psychiatric History: Depression  Risk to Self:  No Risk to Others:  No Prior Inpatient Therapy:  No Prior Outpatient Therapy:  No  Past Medical History:  Past Medical History:  Diagnosis Date   Anemia    Arthritis    CAD (coronary artery disease)    a. s/p CABG in 03/2020 with LIMA-LAD, RIMA-PL, [id number]-OM1   Cancer ([place])    rectal   Cellulitis    COPD (chronic obstructive pulmonary disease) ([place])    Depression    Diabetes mellitus without complication (HCC)    GERD (gastroesophageal reflux disease)    History of kidney stones    Hyperlipidemia 12/01/2019   Hypertension    Hypothyroidism    Ischemic cardiomyopathy    a. EF 20-25% by echo in 02/2020 b. at 40% by echo in 03/2020 c. EF normalized to 60-65% by echo in 04/2020  Myocardial infarction [facility])    Peripheral vascular disease ([place])    Sleep apnea    Type 2 diabetes mellitus [facility])     Past Surgical History:  Procedure Laterality Date   AMPUTATION Right 10/05/2022   Procedure: AMPUTATION BELOW KNEE;  Surgeon: Shane Minion, MD;  Location: [place];  Service: Orthopedics;  Laterality: Right;   APPLICATION OF WOUND VAC Right 10/05/2022   Procedure: APPLICATION OF WOUND VAC;  Surgeon: Shane Minion, MD;  Location: [place];  Service:  Orthopedics;  Laterality: Right;   BIOPSY  07/18/2021   Procedure: BIOPSY;  Surgeon: Shane Harman, DO;  [facility];  Service: Endoscopy;;   BIOPSY  01/09/2022   Procedure: BIOPSY;  Surgeon: Shane Harman, DO;  [facility];  Service: Endoscopy;;   COLONOSCOPY WITH PROPOFOL N/A 07/18/2021   Carver: 15 millimeter polyp removed from the sigmoid colon, 5 mm polyp removed from sigmoid colon.  Nonbleeding internal hemorrhoids.  Significant looping of the colon. sigmoid path showed invasive colonic adenocarcinoma involving tubular adenoma (invades to depth of 61m, carcinoma 147mfrom margin, no lymphovascular invasion, no poorly differentiated component.   CORONARY ARTERY BYPASS GRAFT N/A 03/13/2020   Procedure: CORONARY ARTERY BYPASS GRAFTING (CABG) times five using bilateral Internal mammary arteries and left radial artery;  Surgeon: Shane Chambers;  [place] Service: Open [place] Surgery;  Laterality: N/A;   DENTAL SURGERY     ESOPHAGOGASTRODUODENOSCOPY (EGD) WITH PROPOFOL N/A 07/18/2021   Carver: 1 gastric polyp status post biopsy, gastritis. gastric bx with slight chronic inflammation and no H.pyori. GEJ polypectomy with mild inflammation only   FLEXIBLE SIGMOIDOSCOPY N/A 08/26/2021   Carver: Nonbleeding internal hemorrhoids.  15 mm ulcers from previous polypectomy found in the rectum.  No evidence of previous polyp.  Located 5 to 8 cm from anal verge.   FLEXIBLE SIGMOIDOSCOPY N/A 01/09/2022   Procedure: FLEXIBLE SIGMOIDOSCOPY;  Surgeon: Shane Chambers;  [facility];  Service: Endoscopy;  Laterality: N/A;   POLYPECTOMY  07/18/2021   Procedure: POLYPECTOMY INTESTINAL;  Surgeon: Shane Chambers;  [facility];  Service: Endoscopy;;   RADIAL ARTERY HARVEST Left 03/13/2020   Procedure: RAHousatonic  Surgeon: Shane Chambers;  [place] Service: Open Heart Surgery;  Laterality: Left;   RIGHT/LEFT HEART  CATH AND CORONARY ANGIOGRAPHY N/A 03/07/2020   Procedure: RIGHT/LEFT HEART CATH AND CORONARY ANGIOGRAPHY;  Surgeon: CoSherren MochaMD;  [place] [facility];  Service: Cardiovascular;  Laterality: N/A;   SUBMUCOSAL LIFTING INJECTION  01/09/2022   Procedure: SUBMUCOSAL LIFTING INJECTION;  Surgeon: Shane Chambers;  [facility];  Service: Endoscopy;;   SUBMUCOSAL TATTOO INJECTION  01/09/2022   Procedure: SUBMUCOSAL TATTOO INJECTION;  Surgeon: Shane Chambers;  [facility];  Service: Endoscopy;;   TEE WITHOUT CARDIOVERSION N/A 03/13/2020   Procedure: TRANSESOPHAGEAL ECHOCARDIOGRAM (TEE);  Surgeon: Shane Chambers;  [place] Service: Open Heart Surgery;  Laterality: N/A;   Family History:  Family History  Problem Relation Age of Onset   Hypertension Mother    Family Psychiatric  History: Deceased aunt had history of schizophrenia  Social History:  Social History   Substance and Sexual Activity  Alcohol Use Not Currently   Comment: rarely     Social History   Substance and Sexual Activity  Drug Use No    Social History  Socioeconomic History   Marital status: Single    Spouse name: Not on file   Number of children: Not on file   Years of education: Not on file   Highest education level: Not on file  Occupational History   Not on file  Tobacco Use   Smoking status: Former    Packs/day: 1.50    Types: Cigarettes    Quit date: 05/25/1995    Years since quitting: 27.4   Smokeless tobacco: Never  Vaping Use   Vaping Use: Never used  Substance and Sexual Activity   Alcohol use: Not Currently    Comment: rarely   Drug use: No   Sexual activity: Not on file  Other Topics Concern   Not on file  Social History Narrative   Not on file   Social Determinants of Health   Financial Resource Strain: Not on file  Food Insecurity: No Food Insecurity (09/26/2022)   Hunger Vital Sign    Worried About Running Out of Food in  the Last Year: Never true    Ran Out of Food in the Last Year: Never true  Transportation Needs: No Transportation Needs (09/26/2022)   PRAPARE - Hydrologist (Medical): No    Lack of Transportation (Non-Medical): No  Physical Activity: Not on file  Stress: Not on file  Social Connections: Not on file   Additional Social History:    Allergies:   Allergies  Allergen Reactions   Ace Inhibitors Swelling and Cough    (Not on MAR at [facility])   Haloperidol And Related     Do NOT give anti-psychotics due to risk of  Torsades and QT prolongation (per Dr. Graciela Husbands request)   Other Itching    [name]   Shellfish Allergy    Penicillins Itching and Rash    Has patient had a PCN reaction causing immediate rash, facial/tongue/throat swelling, SOB or lightheadedness with hypotension: Unknown Has patient had a PCN reaction causing severe rash involving mucus membranes or skin necrosis: Unknown Has patient had a PCN reaction that required hospitalization: Unknown Has patient had a PCN reaction occurring within the last 10 years: Unknown If all of the above answers are "NO", then may proceed with Cephalosporin use.     Labs:  Results for orders placed or performed during the hospital encounter of 10/27/22 (from the past 48 hour(s))  Basic metabolic panel     Status: Abnormal   Collection Time: 10/27/22 11:40 PM  Result Value Ref Range   Sodium 137 135 - 145 mmol/L   Potassium 4.0 3.5 - 5.1 mmol/L   Chloride 104 98 - 111 mmol/L   CO2 24 22 - 32 mmol/L   Glucose, Bld 121 (H) 70 - 99 mg/dL    Comment: Glucose reference range applies only to samples taken after fasting for at least 8 hours.   BUN 22 (H) 6 - 20 mg/dL   Creatinine, Ser 1.16 0.61 - 1.24 mg/dL   Calcium 8.5 (L) 8.9 - 10.3 mg/dL   GFR, Estimated >60 >60 mL/min    Comment: (NOTE) Calculated using the CKD-EPI Creatinine Equation (2021)    Anion gap 9 5 - 15     Comment: Performed at [facility], [address] [place], [place] [address]  CBC with Differential     Status: Abnormal   Collection Time: 10/27/22 11:40 PM  Result Value Ref Range   WBC 9.8 4.0 - 10.5 K/uL  RBC 3.27 (L) 4.22 - 5.81 MIL/uL   Hemoglobin 9.3 (L) 13.0 - 17.0 g/dL   HCT 29.8 (L) 39.0 - 52.0 %   MCV 91.1 80.0 - 100.0 fL   MCH 28.4 26.0 - 34.0 pg   MCHC 31.2 30.0 - 36.0 g/dL   RDW 16.0 (H) 11.5 - 15.5 %   Platelets 357 150 - 400 K/uL   nRBC 0.0 0.0 - 0.2 %   Neutrophils Relative % 62 %   Neutro Abs 6.2 1.7 - 7.7 K/uL   Lymphocytes Relative 22 %   Lymphs Abs 2.1 0.7 - 4.0 K/uL   Monocytes Relative 9 %   Monocytes Absolute 0.8 0.1 - 1.0 K/uL   Eosinophils Relative 5 %   Eosinophils Absolute 0.5 0.0 - 0.5 K/uL   Basophils Relative 1 %   Basophils Absolute 0.1 0.0 - 0.1 K/uL   Immature Granulocytes 1 %   Abs Immature Granulocytes 0.13 (H) 0.00 - 0.07 K/uL    Comment: Performed at [facility], [address], [place], [place] [address]  Ethanol     Status: None   Collection Time: 10/27/22 11:40 PM  Result Value Ref Range   Alcohol, Ethyl (B) <10 <10 mg/dL    Comment: (NOTE) Lowest detectable limit for serum alcohol is 10 mg/dL.  For medical purposes only. Performed at [facility], [address] [place], [place] [address]     Medications:  No current facility-administered medications for this encounter.   Current Outpatient Medications  Medication Sig Dispense Refill   acetaminophen (TYLENOL) 500 MG tablet Take 1,000 mg by mouth every 8 (eight) hours as needed for fever or moderate pain.     albuterol (VENTOLIN HFA) 108 (90 Base) MCG/ACT inhaler Inhale 2 puffs into the lungs every 6 (six) hours as needed for wheezing or shortness of breath.     alum & mag hydroxide-simeth (MAALOX/MYLANTA) 200-200-20 MG/5ML suspension Take 30 mLs by mouth every 6 (six) hours as needed for indigestion or heartburn. 355 mL 0   Amino Acids-Protein Hydrolys (PRO-STAT AWC PO) Take  30 mLs by mouth 2 (two) times daily.     amLODipine (NORVASC) 10 MG tablet Take 1 tablet (10 mg total) by mouth daily.     aspirin 81 MG EC tablet Take 1 tablet (81 mg total) by mouth daily with breakfast. 30 tablet 12   atorvastatin (LIPITOR) 80 MG tablet Take 80 mg by mouth at bedtime.     budesonide-formoterol (SYMBICORT) 160-4.5 MCG/ACT inhaler Inhale 2 puffs into the lungs in the morning and at bedtime. 1 Inhaler 6   Cholecalciferol (VITAMIN D) 50 MCG (2000 UT) tablet Take 2,000 Units by mouth daily.     docusate sodium (COLACE) 100 MG capsule Take 100 mg by mouth daily.     feeding supplement (ENSURE ENLIVE / ENSURE PLUS) LIQD Take 237 mLs by mouth 3 (three) times daily between meals. 237 mL 12   FERROUS GLUCONATE PO Take 325 mg by mouth daily.     insulin aspart (NOVOLOG FLEXPEN) 100 UNIT/ML FlexPen 0-15 Units, Subcutaneous, 3 times daily with meals CBG < 70: implement hypoglycemia protocol-call MD CBG 70 - 120: 0 units CBG 121 - 150: 2 units CBG 151 - 200: 3 units CBG 201 - 250: 5 units CBG 251 - 300: 8 units CBG 301 - 350: 11 units CBG 351 - 400: 15 units CBG > 400: 15 mL 0   ipratropium-albuterol (DUONEB) 0.5-2.5 (3) MG/3ML SOLN Take 3 mLs by nebulization every  6 (six) hours as needed. 360 mL    levothyroxine (SYNTHROID) 75 MCG tablet Take 75 mcg by mouth daily before breakfast.     loperamide HCl (IMODIUM) 2 MG/15ML solution Take 4 mg by mouth every 6 (six) hours as needed for diarrhea or loose stools.     magnesium oxide (MAG-OX) 400 MG tablet Take 400 mg by mouth daily.     metFORMIN (GLUCOPHAGE) 1000 MG tablet Take 1,000 mg by mouth 2 (two) times daily.     metoprolol tartrate (LOPRESSOR) 100 MG tablet Take 1 tablet (100 mg total) by mouth 2 (two) times daily.     Multiple Vitamin (MULTIVITAMIN) tablet Take 1 tablet by mouth daily.     omeprazole (PRILOSEC) 40 MG capsule Take 40 mg by mouth daily.     polyethylene glycol (MIRALAX / GLYCOLAX) 17 g packet Take 17 g by mouth  daily as needed for moderate constipation.     polyvinyl alcohol (LIQUIFILM TEARS) 1.4 % ophthalmic solution Place 1 drop into both eyes daily.     potassium chloride SA (KLOR-CON M) 20 MEQ tablet Take 1 tablet (20 mEq total) by mouth daily. (Patient not taking: Reported on 10/20/2022) 90 tablet 1   senna-docusate (SENOKOT-S) 8.6-50 MG tablet Take 1 tablet by mouth 2 (two) times daily.     tamsulosin (FLOMAX) 0.4 MG CAPS capsule Take 0.8 mg by mouth daily.     thiamine (VITAMIN B-1) 100 MG tablet Take 1 tablet (100 mg total) by mouth daily.     torsemide (DEMADEX) 20 MG tablet Take 2 tablets (40 mg total) by mouth daily. 90 tablet 6    Musculoskeletal: Strength & Muscle Tone: within normal limits Gait & Station: Patient is bed ridden Patient leans: N/A, patient is bed ridden  Psychiatric Specialty Exam:  Presentation  General Appearance:  Bizarre  Eye Contact: Absent; Minimal  Speech: Garbled  Speech Volume: Decreased  Handedness: Right  Mood and Affect  Mood: Anxious; Depressed; Dysphoric  Affect: Congruent  Thought Process  Thought Processes: Coherent  Descriptions of Associations:Intact  Orientation:Full (Time, Place and Person)  Thought Content:Logical  History of Schizophrenia/Schizoaffective disorder:No  Duration of Psychotic Symptoms:N/A  Hallucinations:Hallucinations: Auditory Description of Auditory Hallucinations: Hearing people laughing at him  Ideas of Reference:None  Suicidal Thoughts:Suicidal Thoughts: Yes, Passive SI Passive Intent and/or Plan: Without Intent; Without Plan; With Means to Carry Out  Homicidal Thoughts:Homicidal Thoughts: No  Sensorium  Memory: Immediate Fair; Remote Fair  Judgment: Poor  Insight: Lacking  Executive Functions  Concentration: Fair  Attention Span: Fair  Recall: [employer] of Knowledge: Fair  Language: Fair  Psychomotor Activity  Psychomotor Activity: Psychomotor Activity: Normal  (Arms contracted)  Assets  Assets: Communication Skills; Physical Health  Sleep  Sleep: Sleep: Fair Number of Hours of Sleep: 4  Physical Exam: Physical Exam Vitals and nursing note reviewed.   Review of Systems  Constitutional: Negative.   HENT: Negative.    Eyes: Negative.   Respiratory: Negative.    Cardiovascular: Negative.        Blood pressure 163/64, pulse 66.  Nursing staff to recheck vital signs  Gastrointestinal: Negative.   Genitourinary: Negative.   Musculoskeletal: Negative.   Skin: Negative.   Neurological: Negative.   Endo/Heme/Allergies: Negative.   Psychiatric/Behavioral:  Positive for depression and suicidal ideas. The patient is nervous/anxious and has insomnia.    Blood pressure (!) 163/64, pulse 66, temperature 98.1 F (36.7 C), temperature source Oral, resp. rate 18, height '5\' 9"'$  (1.753 m),  weight 117.5 kg, SpO2 96 %. Body mass index is 38.25 kg/m.  Treatment Plan Summary: Daily contact with patient to assess and evaluate symptoms and progress in treatment and Medication management  Plan: Depression: --Initiate Remeron 15 mg p.o. nightly  Anxiety: --Initiate hydroxyzine 25 mg p.o. 3 times daily as needed  Insomnia: -- Initiate trazodone 50 mg p.o. q. nightly  Disposition: Recommend psychiatric Inpatient admission when medically cleared.  This service was provided via telemedicine using a 2-way, interactive audio and video technology.  Names of all persons participating in this telemedicine service and their role in this encounter. Name: [name] Role: Patient  Name: Garrison Columbus Role: NP provider  Name: Dr. Caswell Corwin Role: [facility] medical Director  Name: Dr. Stark Jock Role: Pain ED physician    Laretta Bolster, FNP 10/28/2022 11:13 AM

## 2022-10-28 NOTE — ED Notes (Signed)
Pt's dressing to R BKA had come off. PT requesting that it be re-dressed. Re-dressed R BKA stub with non-adherent telfa and wrapped with [place].

## 2022-10-28 NOTE — ED Notes (Signed)
Pearline Cables, MD notified re: pts having redness 4cm x 4 cm to L lower anterior leg, MD at bedside, area warm to touch, no drainage noted at this time

## 2022-10-28 NOTE — ED Notes (Signed)
TTS in progress 

## 2022-10-28 NOTE — ED Provider Notes (Signed)
Pt seen at bedside, he has left shin erythema, abrasion noted there as well. No purulent drainage of abscess. No swelling. No streaking. Not septic. Will start on keflex for mild cellulitis. He has right sided BKA with staples intact, surgical site without s/s infection or drainage, staples intact.    Jeanell Sparrow, DO 10/28/22 1521

## 2022-10-29 DIAGNOSIS — F333 Major depressive disorder, recurrent, severe with psychotic symptoms: Secondary | ICD-10-CM | POA: Diagnosis not present

## 2022-10-29 LAB — SARS CORONAVIRUS 2 BY RT PCR: SARS Coronavirus 2 by RT PCR: NEGATIVE

## 2022-10-29 NOTE — ED Provider Notes (Signed)
Emergency Medicine Observation Re-evaluation Note  Shane Chambers is a 61 y.o. male, seen on rounds today.  Pt initially presented to the ED for complaints of Suicidal Currently, the patient is sleeping in NAD.  Physical Exam  BP (!) 148/89 (BP Location: Left Arm)   Pulse 87   Temp 97.6 F (36.4 C) (Oral)   Resp 15   Ht '5\' 9"'$  (1.753 m)   Wt 117.5 kg   SpO2 96%   BMI 38.25 kg/m  Physical Exam General: Appears to be resting comfortably in bed, no acute distress. Cardiac: Regular rate, normal heart rate, non-emergent blood pressure for this morning's vitals. Lungs: No increased work of breathing.  Equal chest rise appreciated Psych: Calm, asleep in bed.   ED Course / MDM  EKG:   I have reviewed the labs performed to date as well as medications administered while in observation.   Plan  Current plan is for inpatient psychiatric admission.    Tretha Sciara, MD 10/29/22 [phone]

## 2022-10-29 NOTE — ED Notes (Signed)
Spoke with staff from [facility] who verified that they are unable to take pt due to him being a right BKA

## 2022-10-29 NOTE — Progress Notes (Signed)
Pt was accepted to [facility] 10/29/2022, pending negative covid faxed to [phone]  Pt meets inpatient criteria per Garrison Columbus, NP  Attending Physician will be Jonelle Sports, MD  Report can be called to: [phone] (this is a pager, please leave call-back number when giving report)  Pt can arrive after pending items are received  Care Team Notified: Idelle Crouch, RN and Garrison Columbus, NP  Denna Haggard, [place]  10/29/2022 12:51 PM

## 2022-10-29 NOTE — Progress Notes (Signed)
CSW received phone call from [facility] requesting to speak with RN about pt's care. CSW provided [facility] with pt's nurse, Idelle Crouch, RN at [phone]. Pt is currently under review at [facility]. CSW will continue to assist and follow with placement.  Denna Haggard, [place]  10/29/2022 12:08 PM

## 2022-10-29 NOTE — ED Notes (Signed)
Pt will not keep door open. Continues to slam it shut. [employer] called to come to department to help de-escalate the pt.

## 2022-10-29 NOTE — Progress Notes (Signed)
LCSW Progress Note  [id number]   Shane Chambers  10/29/2022  11:54 AM  Description:   Inpatient Psychiatric Referral  Patient was recommended inpatient per Garrison Columbus, NP. There are no available beds at [facility]. Patient was referred to the following facilities:   Destination Service Provider Address Phone [facility]  [address], [place] [address] [place]  [facility]  [address] [place] [address] [phone]  [facility]  [place], [place] [address] [phone]  [facility]  [place], [place] [address] [place]  [facility] [address] [place] [address] [phone]  [facility]  [address] [place] [address] [phone]  [facility]  [place], [place] [address] [phone]  [facility]  [address] [place], [place] [address] [phone]  [facility]  [address] [place] [address] [phone]  [facility]  [address], [place] [address] [phone] [place]  [address], [place] [address] [phone] [place]  [address] [place] [address] [phone]  [facility]  [address], [place] [address] [phone] [facility]  [place], [place] [address] [phone]  [facility]  [address] [place] [address] [phone]      Situation ongoing, CSW to continue following and update chart as  more information becomes available.      Denna Haggard, [place]  10/29/2022 11:54 AM

## 2022-10-30 ENCOUNTER — Encounter: Payer: Medicare Other | Admitting: Orthopedic Surgery

## 2022-10-30 DIAGNOSIS — F333 Major depressive disorder, recurrent, severe with psychotic symptoms: Secondary | ICD-10-CM | POA: Diagnosis not present

## 2022-10-30 LAB — CBG MONITORING, ED
Glucose-Capillary: 115 mg/dL — ABNORMAL HIGH (ref 70–99)
Glucose-Capillary: 152 mg/dL — ABNORMAL HIGH (ref 70–99)

## 2022-10-30 MED ORDER — ACETAMINOPHEN 500 MG PO TABS
1000.0000 mg | ORAL_TABLET | Freq: Three times a day (TID) | ORAL | Status: DC | PRN
  Administered 2022-11-04 – 2022-11-05 (×2): 1000 mg via ORAL
  Filled 2022-10-30 (×2): qty 2

## 2022-10-30 MED ORDER — AMLODIPINE BESYLATE 5 MG PO TABS
10.0000 mg | ORAL_TABLET | Freq: Every day | ORAL | Status: DC
  Administered 2022-10-30 – 2022-11-05 (×7): 10 mg via ORAL
  Filled 2022-10-30 (×7): qty 2

## 2022-10-30 MED ORDER — ATORVASTATIN CALCIUM 40 MG PO TABS
80.0000 mg | ORAL_TABLET | Freq: Every day | ORAL | Status: DC
  Administered 2022-10-30 – 2022-11-04 (×6): 80 mg via ORAL
  Filled 2022-10-30 (×6): qty 2

## 2022-10-30 MED ORDER — ASPIRIN 81 MG PO TBEC
81.0000 mg | DELAYED_RELEASE_TABLET | Freq: Every day | ORAL | Status: DC
  Administered 2022-10-30 – 2022-11-05 (×7): 81 mg via ORAL
  Filled 2022-10-30 (×7): qty 1

## 2022-10-30 MED ORDER — MOMETASONE FURO-FORMOTEROL FUM 200-5 MCG/ACT IN AERO
2.0000 | INHALATION_SPRAY | Freq: Two times a day (BID) | RESPIRATORY_TRACT | Status: DC
  Administered 2022-10-30 – 2022-11-05 (×13): 2 via RESPIRATORY_TRACT
  Filled 2022-10-30 (×5): qty 8.8

## 2022-10-30 MED ORDER — TORSEMIDE 20 MG PO TABS
40.0000 mg | ORAL_TABLET | Freq: Every day | ORAL | Status: DC
  Administered 2022-10-30 – 2022-11-05 (×7): 40 mg via ORAL
  Filled 2022-10-30 (×7): qty 2

## 2022-10-30 MED ORDER — METFORMIN HCL 500 MG PO TABS
1000.0000 mg | ORAL_TABLET | Freq: Two times a day (BID) | ORAL | Status: DC
  Administered 2022-10-30 – 2022-11-05 (×14): 1000 mg via ORAL
  Filled 2022-10-30 (×14): qty 2

## 2022-10-30 MED ORDER — THIAMINE MONONITRATE 100 MG PO TABS
100.0000 mg | ORAL_TABLET | Freq: Every day | ORAL | Status: DC
  Administered 2022-10-30 – 2022-11-05 (×7): 100 mg via ORAL
  Filled 2022-10-30 (×7): qty 1

## 2022-10-30 MED ORDER — METFORMIN HCL 500 MG PO TABS: 1000.0000 mg | ORAL_TABLET | Freq: Two times a day (BID) | ORAL | Status: DC

## 2022-10-30 MED ORDER — POTASSIUM CHLORIDE CRYS ER 20 MEQ PO TBCR
20.0000 meq | EXTENDED_RELEASE_TABLET | Freq: Every day | ORAL | Status: AC
  Administered 2022-10-30 – 2022-11-03 (×5): 20 meq via ORAL
  Filled 2022-10-30 (×5): qty 1

## 2022-10-30 NOTE — Progress Notes (Signed)
LCSW Progress Note  [id number]   BOMANI OOMMEN  10/30/2022  10:49 AM  Description:   Inpatient Psychiatric Referral  Patient was recommended inpatient per Garrison Columbus, NP. There are no available beds at [facility]. Patient was referred to the following facilities:   Destination Service Provider Address Phone [facility]  [address], [place] [address] [phone]  [facility]  [place], [place] [address] [phone]  [facility]  [place], [place] [address] [place]  [facility] [address] [place] [address] [phone]  [facility]  [address] [place] [address] [phone]  [facility]  [place], [place] [address] [phone]  [facility]  [address] [place] [address] [place], [place] [address] [phone] [facility]  [address], [place] [address] [phone] [place]  [address] [place] [address] [phone] [place]  [address] [place] [address] [phone] [facility]  [address], [place] [address] [phone] [facility]  [place], [place] [address] [phone]  [facility]  [address], [place] [address] [phone]      Situation ongoing, CSW to continue following and update chart as more information becomes available.      Denna Haggard, Latanya Presser  10/30/2022 10:49 AM

## 2022-10-30 NOTE — ED Notes (Signed)
Breakfast tray provided. 

## 2022-10-30 NOTE — ED Notes (Signed)
Pt picking at leg until it bleeds. Cleaned pts leg and notified RN.

## 2022-10-30 NOTE — ED Provider Notes (Signed)
Emergency Medicine Observation Re-evaluation Note  Shane Chambers is a 61 y.o. male, seen on rounds today.  Pt initially presented to the ED for complaints of Suicidal Currently, the patient is resting in NAD.  Physical Exam  BP (!) 154/72 (BP Location: Right Arm)   Pulse 89   Temp 97.7 F (36.5 C) (Oral)   Resp 20   Ht '5\' 9"'$  (1.753 m)   Wt 117.5 kg   SpO2 99%   BMI 38.25 kg/m  Physical Exam General: Appears to be resting comfortably in bed, no acute distress. Cardiac: Regular rate, normal heart rate, non-emergent blood pressure for this morning's vitals. Lungs: No increased work of breathing.  Equal chest rise appreciated Psych: Calm, asleep in bed.   ED Course / MDM  EKG:   I have reviewed the labs performed to date as well as medications administered while in observation.    Plan  Current plan is for IP psychiatric placement.    Tretha Sciara, MD 10/30/22 [phone]

## 2022-10-30 NOTE — ED Notes (Signed)
Lunch tray provided. 

## 2022-10-30 NOTE — ED Notes (Signed)
Pt given ginger ale to drink. 

## 2022-10-30 NOTE — ED Notes (Signed)
Pt requesting pain medication at this time. Pt educated that he can have his pain medication at 0500. Pt is ok with that. Will continue care

## 2022-10-30 NOTE — Progress Notes (Signed)
Patient has been denied by [facility] due to no appropriate beds available. Patient meets [place] inpatient criteria per Garrison Columbus, NP. Patient has been faxed out to the following facilities:    [facility]  [address] [place] [address] [place]  [facility]  [address] [place] [address] [phone]  [facility]  [place], [place] [address] [phone]  [facility]  [place], [name] [address] [place]  [facility] [address] [place] [address] [phone]  [facility]  [address] [place] [address] [phone]  [facility]  [place], [place] [address] [phone] [facility]  [address] [place], [place] [address] [phone]  [facility]  [address] [place] [address] [phone]  [facility]  [address] [place] [address] [phone] [place]  [address] [place] [address] [phone] [facility]  [address], [place] [address] [phone] [facility]  [employer], [place] [address] [phone]  [facility]  [address] [place]  [address] [phone]    Mariea Clonts, MSW, LCSW-A  8:24 PM 10/30/2022

## 2022-10-31 DIAGNOSIS — F323 Major depressive disorder, single episode, severe with psychotic features: Secondary | ICD-10-CM | POA: Diagnosis not present

## 2022-10-31 DIAGNOSIS — F333 Major depressive disorder, recurrent, severe with psychotic symptoms: Secondary | ICD-10-CM | POA: Diagnosis not present

## 2022-10-31 MED ORDER — TAMSULOSIN HCL 0.4 MG PO CAPS
0.8000 mg | ORAL_CAPSULE | Freq: Every day | ORAL | Status: DC
  Administered 2022-10-31 – 2022-11-05 (×6): 0.8 mg via ORAL
  Filled 2022-10-31 (×6): qty 2

## 2022-10-31 MED ORDER — PANTOPRAZOLE SODIUM 40 MG PO TBEC
40.0000 mg | DELAYED_RELEASE_TABLET | Freq: Every day | ORAL | Status: DC
  Administered 2022-10-31 – 2022-11-05 (×6): 40 mg via ORAL
  Filled 2022-10-31 (×6): qty 1

## 2022-10-31 MED ORDER — ALBUTEROL SULFATE (2.5 MG/3ML) 0.083% IN NEBU: 2.5000 mg | INHALATION_SOLUTION | Freq: Four times a day (QID) | RESPIRATORY_TRACT | Status: DC | PRN

## 2022-10-31 MED ORDER — LEVOTHYROXINE SODIUM 50 MCG PO TABS
75.0000 ug | ORAL_TABLET | Freq: Every day | ORAL | Status: DC
  Administered 2022-10-31 – 2022-11-05 (×6): 75 ug via ORAL
  Filled 2022-10-31 (×6): qty 2

## 2022-10-31 MED ORDER — MAGNESIUM OXIDE -MG SUPPLEMENT 400 (240 MG) MG PO TABS
400.0000 mg | ORAL_TABLET | Freq: Every day | ORAL | Status: DC
  Administered 2022-10-31 – 2022-11-05 (×6): 400 mg via ORAL
  Filled 2022-10-31 (×7): qty 1

## 2022-10-31 MED ORDER — DOCUSATE SODIUM 100 MG PO CAPS
100.0000 mg | ORAL_CAPSULE | Freq: Every day | ORAL | Status: DC
  Administered 2022-10-31 – 2022-11-05 (×6): 100 mg via ORAL
  Filled 2022-10-31 (×6): qty 1

## 2022-10-31 MED ORDER — ENSURE ENLIVE PO LIQD
237.0000 mL | Freq: Three times a day (TID) | ORAL | Status: DC
  Administered 2022-10-31 – 2022-11-05 (×17): 237 mL via ORAL
  Filled 2022-10-31 (×4): qty 237

## 2022-10-31 MED ORDER — ALBUTEROL SULFATE HFA 108 (90 BASE) MCG/ACT IN AERS: 2.0000 | INHALATION_SPRAY | Freq: Four times a day (QID) | RESPIRATORY_TRACT | Status: DC | PRN

## 2022-10-31 MED ORDER — METOPROLOL TARTRATE 50 MG PO TABS
100.0000 mg | ORAL_TABLET | Freq: Two times a day (BID) | ORAL | Status: DC
  Administered 2022-10-31 – 2022-11-05 (×11): 100 mg via ORAL
  Filled 2022-10-31 (×11): qty 2

## 2022-10-31 MED ORDER — MIRTAZAPINE 30 MG PO TBDP
30.0000 mg | ORAL_TABLET | Freq: Every day | ORAL | Status: DC
  Administered 2022-10-31 – 2022-11-04 (×5): 30 mg via ORAL
  Filled 2022-10-31 (×7): qty 1

## 2022-10-31 MED ORDER — CEPHALEXIN 500 MG PO CAPS
500.0000 mg | ORAL_CAPSULE | Freq: Three times a day (TID) | ORAL | Status: AC
  Administered 2022-10-31 – 2022-11-05 (×15): 500 mg via ORAL
  Filled 2022-10-31 (×15): qty 1

## 2022-10-31 NOTE — Progress Notes (Signed)
Patient has been denied by [facility] due to no appropriate beds available. Patient meets [place] inpatient criteria per Garrison Columbus, NP. Patient has been faxed out to the following facilities:   [facility]  [address], [place] [address] [name]  [facility]  [address] [place] [address] [phone]  [facility] [place]  [place], [place] [address] [phone]  [facility]  [place], [place] [address] [place]  [facility] [address] [place] [address] [phone]  [facility]  [address] [place] [address] [phone]  [facility]  [place], [place] [address] [phone] [facility]  [address] [place], [place] [address] [phone]  [facility]  [address] [place] [address] [phone]  [facility]  [address] [place] [address] [phone] [place]  [address] [place] [address] [phone] [facility]  [address], [place] [address] [phone] [facility]  [place], [place] [address] [phone]  [facility]  [address] [place] [address] [phone] [place], MSW, LCSW-A  9:43 PM 10/31/2022

## 2022-10-31 NOTE — ED Notes (Signed)
TTS assessment in progress. 

## 2022-10-31 NOTE — ED Notes (Signed)
Attempted to wake patient up to give morning meds, patient woke up and said "huh" and went back to sleep. EDNT to obtain vital signs

## 2022-10-31 NOTE — ED Notes (Signed)
Report received from Beth, RN.

## 2022-10-31 NOTE — ED Notes (Signed)
Patient asleep and per night shift nurse, was awake until 0400. Hold 0800 medicines until 1000.

## 2022-10-31 NOTE — ED Notes (Signed)
Patient has BKA with redness, yellow eschar with slight dehiscence of wound with staples and some sanguinous drainage on blankets. EDP made aware.

## 2022-10-31 NOTE — ED Notes (Signed)
Patient alert and oriented and was able to swallow morning medications. Patient does chew is medicines prior to swallowing and states "nothing works right anymore."

## 2022-10-31 NOTE — Consult Note (Signed)
Telepsych Consultation   Reason for Consult:  SI Referring Physician:  Veryl Speak, MD Location of Patient: [facility] [id number] Location of Provider: [facility] Department  Patient Identification: Shane Chambers MRN:  [id number] Principal Diagnosis: <principal problem not specified> Diagnosis:  Active Problems:   Depression   Total Time spent with patient: 30 minutes  Subjective:   Shane Chambers is a 61 y.o. male patient admitted with suicidal ideations.  Patient presents sitting on side of the bed. Watching television. Alert and oriented. Withdrawn, slow to participate in assessment. Passive with information and participation. Dysphoric mood. Flat affect. One to two word answers. Minimal eye contact.  Reports feeling "No different than yesterday". When discussing feelings regarding recent right below the knee amputation 10/05/22, 'there is no real adjustment of any sort. They don't care for it like they should, I haven't had any therapy like I should'. States he's been living in his current facility [facility]) for 2 years this month. Previously lived in [place], [place]; endorses family in the area "not sure" if they are supportive. He endorses extensive history of depression '40 or 60 years' with some self harming behavior, 'unfortunately I didn't do it'. No children or marriages. Denies any history of marriage or active significant other. Reports 'always having the thoughts of wanting to close my eyes and not wake up'; states current SI has been for 'months'. Relates 'being in prison in a nursing home' as trigger to thoughts. Says he has been attempting to get back out on his own to live but feels social work at the facility has not been helpful. He reports multiple complaints of conditions and treatment at current facility. Rates current anxiety, depression as 9/10. States he doesn't feel safe at hospital; Endorses active suicidal ideations and auditory hallucinations 'saying  the same stuff they always say. That I'm stupid'. Unable to contract for safety.   Medication trials: most recently on Effexor, unable to name exact dose or other medications at this time. Provider discussed starting Abilify for current symptoms; discussed target symptoms. Patient was initially hesitant then agreed to start treatment.    HPI:  Shane Chambers is a 61 year old male with past psychiatric history of depression and extensive past medical history significant for CHF, HTN, HLD, T2DM, functional quadriplegia, cardiomyopathy with EF 20-25%, cellulitis, and rectal cancer who presented to the ED from the [facility] in [place] for suicidal ideations. Per chart review, patient is a former Marine scientist and has been living in the facility for the past year. During initial TTS assessment patient reported x1 previous suicide attempt of unknown means and daily auditory and visual hallucinations of people laughing at him and seeing people not there. He reported chronic insomnia and poor appetite. No active psychiatric services in place. UDS outstanding; BAL<10. PDMP reviewed, no active prescriptions noted at this time.   Past Psychiatric History: depression  Risk to Self: yes Risk to Others: pt denies Prior Inpatient Therapy: yes Prior Outpatient Therapy: yes  Past Medical History:  Past Medical History:  Diagnosis Date   Anemia    Arthritis    CAD (coronary artery disease)    a. s/p CABG in 03/2020 with LIMA-LAD, RIMA-PL, [id number]   Cancer ([place])    rectal   Cellulitis    COPD (chronic obstructive pulmonary disease) ([place])    Depression    Diabetes mellitus without complication (HCC)    GERD (gastroesophageal reflux disease)    History of kidney stones  Hyperlipidemia 12/01/2019   Hypertension    Hypothyroidism    Ischemic cardiomyopathy    a. EF 20-25% by echo in 02/2020 b. at 40% by echo in 03/2020 c. EF normalized to 60-65% by echo in 04/2020   Myocardial infarction [facility])     Peripheral vascular disease ([place])    Sleep apnea    Type 2 diabetes mellitus [facility])     Past Surgical History:  Procedure Laterality Date   AMPUTATION Right 10/05/2022   Procedure: AMPUTATION BELOW KNEE;  Surgeon: Newt Minion, MD;  Location: [place];  Service: Orthopedics;  Laterality: Right;   APPLICATION OF WOUND VAC Right 10/05/2022   Procedure: APPLICATION OF WOUND VAC;  Surgeon: Newt Minion, MD;  Location: [place];  Service: Orthopedics;  Laterality: Right;   BIOPSY  07/18/2021   Procedure: BIOPSY;  Surgeon: Eloise Harman, DO;  [facility];  Service: Endoscopy;;   BIOPSY  01/09/2022   Procedure: BIOPSY;  Surgeon: Eloise Harman, DO;  [facility];  Service: Endoscopy;;   COLONOSCOPY WITH PROPOFOL N/A 07/18/2021   Carver: 15 millimeter polyp removed from the sigmoid colon, 5 mm polyp removed from sigmoid colon.  Nonbleeding internal hemorrhoids.  Significant looping of the colon. sigmoid path showed invasive colonic adenocarcinoma involving tubular adenoma (invades to depth of 74m, carcinoma 169mfrom margin, no lymphovascular invasion, no poorly differentiated component.   CORONARY ARTERY BYPASS GRAFT N/A 03/13/2020   Procedure: CORONARY ARTERY BYPASS GRAFTING (CABG) times five using bilateral Internal mammary arteries and left radial artery;  Surgeon: AtWonda OldsMD;  Location: [facility] Service: Open Heart Surgery;  Laterality: N/A;   DENTAL SURGERY     ESOPHAGOGASTRODUODENOSCOPY (EGD) WITH PROPOFOL N/A 07/18/2021   Carver: 1 gastric polyp status post biopsy, gastritis. gastric bx with slight chronic inflammation and no H.pyori. GEJ polypectomy with mild inflammation only   FLEXIBLE SIGMOIDOSCOPY N/A 08/26/2021   Carver: Nonbleeding internal hemorrhoids.  15 mm ulcers from previous polypectomy found in the rectum.  No evidence of previous polyp.  Located 5 to 8 cm from anal verge.   FLEXIBLE SIGMOIDOSCOPY N/A 01/09/2022   Procedure: FLEXIBLE  SIGMOIDOSCOPY;  Surgeon: CaEloise HarmanDO;  [facility];  Service: Endoscopy;  Laterality: N/A;   POLYPECTOMY  07/18/2021   Procedure: POLYPECTOMY INTESTINAL;  Surgeon: CaEloise HarmanDO;  [facility];  Service: Endoscopy;;   RADIAL ARTERY HARVEST Left 03/13/2020   Procedure: [place]  Surgeon: AtWonda OldsMD;  [place] Service: Open Heart Surgery;  Laterality: Left;   RIGHT/LEFT HEART CATH AND CORONARY ANGIOGRAPHY N/A 03/07/2020   Procedure: RIGHT/LEFT HEART CATH AND CORONARY ANGIOGRAPHY;  Surgeon: CoSherren MochaMD;  [facility];  Service: Cardiovascular;  Laterality: N/A;   SUBMUCOSAL LIFTING INJECTION  01/09/2022   Procedure: SUBMUCOSAL LIFTING INJECTION;  Surgeon: CaEloise HarmanDO;  [facility];  Service: Endoscopy;;   SUBMUCOSAL TATTOO INJECTION  01/09/2022   Procedure: SUBMUCOSAL TATTOO INJECTION;  Surgeon: CaEloise HarmanDO;  [facility];  Service: Endoscopy;;   TEE WITHOUT CARDIOVERSION N/A 03/13/2020   Procedure: TRANSESOPHAGEAL ECHOCARDIOGRAM (TEE);  Surgeon: AtWonda OldsMD;  [place] [facility] Service: Open Heart Surgery;  Laterality: N/A;   Family History:  Family History  Problem Relation Age of Onset   Hypertension Mother    Family Psychiatric  History: not noted Social History:  Social History  Substance and Sexual Activity  Alcohol Use Not Currently   Comment: rarely     Social History   Substance and Sexual Activity  Drug Use No    Social History   Socioeconomic History   Marital status: Single    Spouse name: Not on file   Number of children: Not on file   Years of education: Not on file   Highest education level: Not on file  Occupational History   Not on file  Tobacco Use   Smoking status: Former    Packs/day: 1.50    Types: Cigarettes    Quit date: 05/25/1995    Years since quitting: 27.4   Smokeless tobacco: Never  Vaping Use    Vaping Use: Never used  Substance and Sexual Activity   Alcohol use: Not Currently    Comment: rarely   Drug use: No   Sexual activity: Not on file  Other Topics Concern   Not on file  Social History Narrative   Not on file   Social Determinants of Health   Financial Resource Strain: Not on file  Food Insecurity: No Food Insecurity (09/26/2022)   Hunger Vital Sign    Worried About Running Out of Food in the Last Year: Never true    Ran Out of Food in the Last Year: Never true  Transportation Needs: No Transportation Needs (09/26/2022)   PRAPARE - Hydrologist (Medical): No    Lack of Transportation (Non-Medical): No  Physical Activity: Not on file  Stress: Not on file  Social Connections: Not on file   Additional Social History:    Allergies:   Allergies  Allergen Reactions   Ace Inhibitors Swelling and Cough    (Not on MAR at [facility])   Haloperidol And Related     Do NOT give anti-psychotics due to risk of  Torsades and QT prolongation (per Dr. Graciela Husbands request)   Other Itching    [name]   Shellfish Allergy    Penicillins Itching and Rash    Has patient had a PCN reaction causing immediate rash, facial/tongue/throat swelling, SOB or lightheadedness with hypotension: Unknown Has patient had a PCN reaction causing severe rash involving mucus membranes or skin necrosis: Unknown Has patient had a PCN reaction that required hospitalization: Unknown Has patient had a PCN reaction occurring within the last 10 years: Unknown If all of the above answers are "NO", then may proceed with Cephalosporin use.     Labs:  Results for orders placed or performed during the hospital encounter of 10/27/22 (from the past 48 hour(s))  CBG monitoring, ED     Status: Abnormal   Collection Time: 10/30/22  9:09 AM  Result Value Ref Range   Glucose-Capillary 115 (H) 70 - 99 mg/dL    Comment: Glucose reference range  applies only to samples taken after fasting for at least 8 hours.  CBG monitoring, ED     Status: Abnormal   Collection Time: 10/30/22  5:51 PM  Result Value Ref Range   Glucose-Capillary 152 (H) 70 - 99 mg/dL    Comment: Glucose reference range applies only to samples taken after fasting for at least 8 hours.    Medications:  Current Facility-Administered Medications  Medication Dose Route Frequency Provider Last Rate Last Admin   acetaminophen (TYLENOL) tablet 1,000 mg  1,000 mg Oral Q8H PRN Countryman, Chase, MD       albuterol (PROVENTIL) (2.5 MG/3ML) 0.083% nebulizer  solution 2.5 mg  2.5 mg Nebulization Q6H PRN Madueme, Elvira C, RPH       amLODipine (NORVASC) tablet 10 mg  10 mg Oral Daily Countryman, Chase, MD   10 mg at 10/31/22 1142   aspirin EC tablet 81 mg  81 mg Oral Q breakfast Countryman, Chase, MD   81 mg at 10/31/22 1140   atorvastatin (LIPITOR) tablet 80 mg  80 mg Oral QHS Tretha Sciara, MD   80 mg at 10/30/22 2303   cephALEXin (KEFLEX) capsule 500 mg  500 mg Oral Q8H Noemi Chapel, MD   500 mg at 10/31/22 1513   docusate sodium (COLACE) capsule 100 mg  100 mg Oral Daily Noemi Chapel, MD   100 mg at 10/31/22 1140   feeding supplement (ENSURE ENLIVE / ENSURE PLUS) liquid 237 mL  237 mL Oral TID BM Noemi Chapel, MD   237 mL at 10/31/22 1512   hydrOXYzine (ATARAX) tablet 25 mg  25 mg Oral TID PRN Laretta Bolster, FNP       levothyroxine (SYNTHROID) tablet 75 mcg  75 mcg Oral QAC breakfast Noemi Chapel, MD   75 mcg at 10/31/22 1141   magnesium oxide (MAG-OX) tablet 400 mg  400 mg Oral Daily Noemi Chapel, MD   400 mg at 10/31/22 1142   metFORMIN (GLUCOPHAGE) tablet 1,000 mg  1,000 mg Oral BID WC Madueme, Elvira C, RPH   1,000 mg at 10/31/22 1141   metoprolol tartrate (LOPRESSOR) tablet 100 mg  100 mg Oral BID Noemi Chapel, MD   100 mg at 10/31/22 1141   mirtazapine (REMERON SOL-TAB) disintegrating tablet 30 mg  30 mg Oral QHS Leevy-Johnson, Raisa Ditto A, NP        mometasone-formoterol (DULERA) 200-5 MCG/ACT inhaler 2 puff  2 puff Inhalation BID Tretha Sciara, MD   2 puff at 10/31/22 0943   oxyCODONE-acetaminophen (PERCOCET/ROXICET) 5-325 MG per tablet 1 tablet  1 tablet Oral Q6H PRN Godfrey Pick, MD   1 tablet at 10/30/22 2304   pantoprazole (PROTONIX) EC tablet 40 mg  40 mg Oral Daily Noemi Chapel, MD   40 mg at 10/31/22 1141   potassium chloride SA (KLOR-CON M) CR tablet 20 mEq  20 mEq Oral Daily Countryman, Cheri Rous, MD   20 mEq at 10/31/22 1140   tamsulosin (FLOMAX) capsule 0.8 mg  0.8 mg Oral Daily Noemi Chapel, MD   0.8 mg at 10/31/22 1140   thiamine (VITAMIN B1) tablet 100 mg  100 mg Oral Daily Countryman, Cheri Rous, MD   100 mg at 10/31/22 1142   torsemide (DEMADEX) tablet 40 mg  40 mg Oral Daily Countryman, Cheri Rous, MD   40 mg at 10/31/22 1140   traZODone (DESYREL) tablet 50 mg  50 mg Oral QHS PRN Laretta Bolster, FNP   50 mg at 10/30/22 2304   Current Outpatient Medications  Medication Sig Dispense Refill   acetaminophen (TYLENOL) 500 MG tablet Take 1,000 mg by mouth every 8 (eight) hours as needed for fever or moderate pain.     albuterol (VENTOLIN HFA) 108 (90 Base) MCG/ACT inhaler Inhale 2 puffs into the lungs every 6 (six) hours as needed for wheezing or shortness of breath.     alum & mag hydroxide-simeth (MAALOX/MYLANTA) 200-200-20 MG/5ML suspension Take 30 mLs by mouth every 6 (six) hours as needed for indigestion or heartburn. 355 mL 0   amLODipine (NORVASC) 10 MG tablet Take 1 tablet (10 mg total) by mouth daily.     aspirin 81 MG  EC tablet Take 1 tablet (81 mg total) by mouth daily with breakfast. 30 tablet 12   atorvastatin (LIPITOR) 80 MG tablet Take 80 mg by mouth at bedtime.     budesonide-formoterol (SYMBICORT) 160-4.5 MCG/ACT inhaler Inhale 2 puffs into the lungs in the morning and at bedtime. 1 Inhaler 6   Cholecalciferol 125 MCG (5000 UT) TABS Take 1 tablet by mouth once a week. On Mondays     clotrimazole-betamethasone (LOTRISONE)  cream Apply 1 Application topically 2 (two) times daily.     docusate sodium (COLACE) 100 MG capsule Take 100 mg by mouth daily.     FERROUS GLUCONATE PO Take 324 mg by mouth daily.     insulin aspart (NOVOLOG FLEXPEN) 100 UNIT/ML FlexPen 0-15 Units, Subcutaneous, 3 times daily with meals CBG < 70: implement hypoglycemia protocol-call MD CBG 70 - 120: 0 units CBG 121 - 150: 2 units CBG 151 - 200: 3 units CBG 201 - 250: 5 units CBG 251 - 300: 8 units CBG 301 - 350: 11 units CBG 351 - 400: 15 units CBG > 400: 15 mL 0   ipratropium-albuterol (DUONEB) 0.5-2.5 (3) MG/3ML SOLN Take 3 mLs by nebulization every 6 (six) hours as needed. 360 mL    levothyroxine (SYNTHROID) 75 MCG tablet Take 75 mcg by mouth daily before breakfast.     loperamide HCl (IMODIUM) 2 MG/15ML solution Take 4 mg by mouth every 6 (six) hours as needed for diarrhea or loose stools.     magnesium oxide (MAG-OX) 400 MG tablet Take 400 mg by mouth daily.     metFORMIN (GLUCOPHAGE) 1000 MG tablet Take 1,000 mg by mouth 2 (two) times daily.     metoprolol tartrate (LOPRESSOR) 100 MG tablet Take 1 tablet (100 mg total) by mouth 2 (two) times daily.     Multiple Vitamin (MULTIVITAMIN) tablet Take 1 tablet by mouth daily.     omeprazole (PRILOSEC) 40 MG capsule Take 40 mg by mouth daily.     polyethylene glycol (MIRALAX / GLYCOLAX) 17 g packet Take 17 g by mouth daily as needed for moderate constipation.     polyvinyl alcohol (LIQUIFILM TEARS) 1.4 % ophthalmic solution Place 1 drop into both eyes daily.     potassium chloride SA (KLOR-CON M) 20 MEQ tablet Take 1 tablet (20 mEq total) by mouth daily. 90 tablet 1   senna-docusate (SENOKOT-S) 8.6-50 MG tablet Take 1 tablet by mouth 2 (two) times daily.     tamsulosin (FLOMAX) 0.4 MG CAPS capsule Take 0.8 mg by mouth daily.     thiamine (VITAMIN B-1) 100 MG tablet Take 1 tablet (100 mg total) by mouth daily.     torsemide (DEMADEX) 20 MG tablet Take 2 tablets (40 mg total) by mouth  daily. 90 tablet 6   Amino Acids-Protein Hydrolys (PRO-STAT AWC PO) Take 30 mLs by mouth 2 (two) times daily.     feeding supplement (ENSURE ENLIVE / ENSURE PLUS) LIQD Take 237 mLs by mouth 3 (three) times daily between meals. 237 mL 12    Musculoskeletal: Strength & Muscle Tone: within normal limits Gait & Station: normal Patient leans: N/A  Psychiatric Specialty Exam:  Presentation  General Appearance:  Disheveled  Eye Contact: Fleeting; Poor; Absent  Speech: Slow  Speech Volume: Decreased  Handedness: Right   Mood and Affect  Mood: Dysphoric; Hopeless; Worthless  Affect: Sports coach Processes: Coherent  Descriptions of Associations:Intact  Orientation:Full (Time, Place and Person)  Thought Content:Illogical  History of Schizophrenia/Schizoaffective disorder:No  Duration of Psychotic Symptoms:N/A  Hallucinations:Hallucinations: Auditory Description of Auditory Hallucinations: calling him stupid  Ideas of Reference:Percusatory  Suicidal Thoughts:Suicidal Thoughts: Yes, Active SI Active Intent and/or Plan: With Intent  Homicidal Thoughts:Homicidal Thoughts: No   Sensorium  Memory: Immediate Good; Recent Good  Judgment: Poor  Insight: Lacking   Executive Functions  Concentration: Fair  Attention Span: Fair  Recall: [place] of Knowledge: Fair  Language: Fair   Psychomotor Activity  Psychomotor Activity:Psychomotor Activity: Normal   Assets  Assets: Housing; Resilience   Sleep  Sleep:Sleep: Poor    Physical Exam: Physical Exam Vitals and nursing note reviewed.  Constitutional:      General: He is not in acute distress.    Appearance: He is obese. He is not ill-appearing.  HENT:     Head: Normocephalic.     Nose: Nose normal.     Mouth/Throat:     Mouth: Mucous membranes are moist.     Pharynx: Oropharynx is clear.  Eyes:     Pupils: Pupils are equal, round, and reactive to  light.  Cardiovascular:     Rate and Rhythm: Tachycardia present.     Pulses: Normal pulses.  Pulmonary:     Effort: Pulmonary effort is normal.  Musculoskeletal:        General: Normal range of motion.     Cervical back: Normal range of motion.  Skin:    General: Skin is dry.  Neurological:     Mental Status: He is oriented to person, place, and time.  Psychiatric:        Attention and Perception: He perceives auditory hallucinations.        Mood and Affect: Mood is depressed. Affect is flat and inappropriate.        Speech: Speech is delayed.        Behavior: Behavior is withdrawn.        Thought Content: Thought content is not paranoid or delusional. Thought content includes suicidal ideation. Thought content does not include homicidal ideation. Thought content includes suicidal plan. Thought content does not include homicidal plan.        Cognition and Memory: Cognition and memory normal.    Review of Systems  Psychiatric/Behavioral:  Positive for depression and suicidal ideas. The patient has insomnia.   All other systems reviewed and are negative.  Blood pressure 105/66, pulse 96, temperature 98.7 F (37.1 C), temperature source Axillary, resp. rate 16, height '5\' 9"'$  (1.753 m), weight 117.5 kg, SpO2 94 %. Body mass index is 38.25 kg/m.  Treatment Plan Summary: Daily contact with patient to assess and evaluate symptoms and progress in treatment, Medication management, and Plan :  Medications:  Major Depressive Disorder Insomnia:  Increase:   - Mirtazepine 30 mg: from 15 mg  daily HS ;patient continues to endorse insomnia, ongoing depression, and poor appetite.   Start:   - Aripriprazole (Abilify)  Disposition: Recommend psychiatric Inpatient admission when medically cleared. Supportive therapy provided about ongoing stressors. Discussed crisis plan, support from social network, calling 911, coming to the Emergency Department, and calling Suicide Hotline.  This  service was provided via telemedicine using a 2-way, interactive audio and video technology.  Names of all persons participating in this telemedicine service and their role in this encounter. Name: Oneida Alar Role: PMHNP  Name: Janine Limbo Role: Attending MD  Name: Shane Chambers Role: patient  Name:  Role:     Inda Merlin, NP 10/31/2022 3:33 PM

## 2022-10-31 NOTE — ED Provider Notes (Signed)
Emergency Medicine Observation Re-evaluation Note  COYLE STORDAHL is a 61 y.o. male, seen on rounds today.  Pt initially presented to the ED for complaints of Suicidal Currently, the patient is calm.  Physical Exam  BP (!) 154/84 (BP Location: Left Arm)   Pulse 97   Temp 98.4 F (36.9 C) (Oral)   Resp 18   Ht 1.753 m ('5\' 9"'$ )   Wt 117.5 kg   SpO2 97%   BMI 38.25 kg/m  Physical Exam General: No acute distress Cardiac: Normal rate and rhythm rate about 95 bpm Lungs: Clear, no wheezing Psych: Calm and redirectable at this time  ED Course / MDM  EKG:   I have reviewed the labs performed to date as well as medications administered while in observation.  Recent changes in the last 24 hours include none.  Started on cephalexin, currently getting, cellulitis looking better  Plan  Current plan is for ongoing evaluation for placement inpatient psychiatry    Noemi Chapel, MD 10/31/22 0900

## 2022-10-31 NOTE — ED Notes (Signed)
Patient unable to swallow meds due to inability to stay awake during conversation. Patient awakes to verbal plus physical stimulation.

## 2022-10-31 NOTE — ED Notes (Signed)
Pt had small soft BM. Pt cleaned and bedding, linen and brief changed. This RN took off old dressing on R leg and replaced with new clean dressing.

## 2022-10-31 NOTE — Progress Notes (Signed)
CSW attempted phone call to [facility] to follow up on referral for [facility] inpatient treatment. CSW was unable to speak with admissions coordinator, but did leave a voicemail inquiring about pt status. CSW will continue to assist and follow with placement.   Herbie Baltimore  10/31/2022 3:38 PM

## 2022-11-01 DIAGNOSIS — F333 Major depressive disorder, recurrent, severe with psychotic symptoms: Secondary | ICD-10-CM | POA: Diagnosis not present

## 2022-11-01 MED ORDER — ARIPIPRAZOLE 5 MG PO TABS
5.0000 mg | ORAL_TABLET | Freq: Every day | ORAL | Status: DC
  Administered 2022-11-01 – 2022-11-05 (×5): 5 mg via ORAL
  Filled 2022-11-01 (×5): qty 1

## 2022-11-01 MED ORDER — BACITRACIN ZINC 500 UNIT/GM EX OINT
TOPICAL_OINTMENT | Freq: Two times a day (BID) | CUTANEOUS | Status: DC
  Administered 2022-11-04: 1 via TOPICAL
  Filled 2022-11-01: qty 2.7
  Filled 2022-11-01 (×8): qty 0.9

## 2022-11-01 NOTE — ED Notes (Signed)
Breakfast tray given. °

## 2022-11-01 NOTE — ED Provider Notes (Signed)
Emergency Medicine Observation Re-evaluation Note  Shane Chambers is a 61 y.o. male, seen on rounds today.  Pt initially presented to the ED for complaints of Suicidal Currently, the patient is resting  Physical Exam  BP (!) 120/53 (BP Location: Right Arm)   Pulse 66   Temp 99 F (37.2 C) (Oral)   Resp 18   Ht '5\' 9"'$  (1.753 m)   Wt 117.5 kg   SpO2 97%   BMI 38.25 kg/m  Physical Exam General: NAD Cardiac: Regular HR Lungs: No respiratory distress Psych: Stable  ED Course / MDM  EKG:   I have reviewed the labs performed to date as well as medications administered while in observation.   Plan  Current plan is for inpatient psychiatry placement.     Wyvonnia Dusky, MD 11/01/22 [phone]

## 2022-11-01 NOTE — ED Notes (Signed)
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 No left. Wound well healed, with small area of redness and minimal oozing from staple site. VO for Bacitracin received. Redressed superficial wound on L lower leg.

## 2022-11-01 NOTE — ED Provider Notes (Signed)
Patient reassessed per medical chart review he was due to have the staples removed from his BKA site 2 days ago.  I was able to remove all of the staples myself.  There is some surrounding erythema, he is already on Keflex and recommended bacitracin twice a day for 5 days which the nurse verbalized understanding of.  The wound on his left lower leg appears quite well-healed.  I do not think he is requiring Xeroform anymore.  There is a superficial ulceration but no streaking erythema of the left lower leg.   Wyvonnia Dusky, MD 11/01/22 [phone]

## 2022-11-01 NOTE — ED Notes (Signed)
Pt awake and requesting ginger ale and ensure, provided to pt.

## 2022-11-01 NOTE — ED Notes (Signed)
Dinner tray given

## 2022-11-02 DIAGNOSIS — F333 Major depressive disorder, recurrent, severe with psychotic symptoms: Secondary | ICD-10-CM | POA: Diagnosis not present

## 2022-11-02 DIAGNOSIS — F323 Major depressive disorder, single episode, severe with psychotic features: Secondary | ICD-10-CM | POA: Diagnosis not present

## 2022-11-02 LAB — CBG MONITORING, ED: Glucose-Capillary: 173 mg/dL — ABNORMAL HIGH (ref 70–99)

## 2022-11-02 NOTE — ED Notes (Signed)
Pt received breakfast tray 

## 2022-11-02 NOTE — Consult Note (Signed)
Telepsych Consultation   Reason for Consult:  SI Referring Physician:  Veryl Speak, MD  Location of Patient: [facility] [id number] Location of Provider: [place] Department  Patient Identification: KAMDEN STANISLAW MRN:  [id number] Principal Diagnosis: Major depressive disorder with psychotic features [facility]) Diagnosis:  Principal Problem:   Major depressive disorder with psychotic features [facility]) Active Problems:   Depression   Total Time spent with patient: 30 minutes  Subjective:   EAVEN SCHWAGER is a 61 y.o. male patient admitted with suicidal ideations.  Patient presents sitting in the bed eating breakfast. Alert and oriented. Calm and cooperative. Casual appearance. Shirt off. Consistent eye contact. Engaged in assessment. Dysphoric mood, brightens on approach; congruent affect.    He reports his mood as 'About the same as usual. Nothing changes a lot with me. But we're off to a good start, they changed my linens this morning for me'. Rates depression 7/10, anxiety 8/10; he verbalizes some improvement from days prior.   Reports 'feeling this way for months'. with ongoing insomnia (for years) he attributes to discomfort of the bed and working night shift for years. Says Trazodone has not been effective in the past. Does get some sleep during the day. This year I hate Christmas being in the hospital. I couldn't even get a piece of candy here without someone making a big deal about it'. He is perseverative on his inability to care for himself and live in skilled nursing facility.  Reports history chronic suicidal ideations 'all of my life not trying to sound like a smart ass. I think about them a lot'. He reports history of x1 attempt in the past. Denies any intent or plans. Says he's unable to return home to live independently due to the trailer being inhabitable. States 'there is no way in hell I'm going back there (nursing home) I'd rather be dead'. Feels his placement is the main  contributing factor to his current mental state. Reports ongoing contact with family (mother) as motivating factor and improving his mood.  He continues to endorse auditory hallucinations that he states have improved some over the past few days. Had questions about aplastic anemia while on Abilify. Provider discussed current lab values and Abilify side effect profile, target symptoms; he remains amenable to treatment. Provider discussed PRN medications to assist in breakthrough anxiety and insomnia. Plan to adjust medications today and place [facility] consult as medication changes made in ED. Patient reports history of chronic depression and passive suicidal ideations dating back years with no active intent, behavior, or plan to complete suicide. He is currently living in a SNF due to health decline where he is under supervision and is currently able to contract for safety. Plan is to continue to observe in ED and re-evaluate plan to return to SNF.    HPI:  CRUZ BONG is a 62 year old male with past psychiatric history of depression and extensive past medical history significant for CHF, HTN, HLD, T2DM, functional quadriplegia, cardiomyopathy with EF 20-25%, cellulitis, and rectal cancer who presented to the ED from the [facility] in [place] for suicidal ideations. Per chart review, patient is a former Marine scientist and has been living in the facility for the past year. During initial TTS assessment patient reported x1 previous suicide attempt of unknown means and daily auditory and visual hallucinations of people laughing at him and seeing people not there. He reported chronic insomnia and poor appetite. No active psychiatric services in place. UDS outstanding; BAL<10. PDMP  reviewed, no active prescriptions noted at this time.    Past Psychiatric History: depression  Risk to Self:  Risk to Others:  Prior Inpatient Therapy:  Prior Outpatient Therapy:   Past Medical History:  Past Medical History:   Diagnosis Date   Anemia    Arthritis    CAD (coronary artery disease)    a. s/p CABG in 03/2020 with LIMA-LAD, RIMA-PL, [id number]-OM1   Cancer ([place])    rectal   Cellulitis    COPD (chronic obstructive pulmonary disease) ([place])    Depression    Diabetes mellitus without complication ([place])    GERD (gastroesophageal reflux disease)    History of kidney stones    Hyperlipidemia 12/01/2019   Hypertension    Hypothyroidism    Ischemic cardiomyopathy    a. EF 20-25% by echo in 02/2020 b. at 40% by echo in 03/2020 c. EF normalized to 60-65% by echo in 04/2020   Myocardial infarction [facility])    Peripheral vascular disease ([place])    Sleep apnea    Type 2 diabetes mellitus ([place])     Past Surgical History:  Procedure Laterality Date   AMPUTATION Right 10/05/2022   Procedure: AMPUTATION BELOW KNEE;  Surgeon: Newt Minion, MD;  Location: [place];  Service: Orthopedics;  Laterality: Right;   APPLICATION OF WOUND VAC Right 10/05/2022   Procedure: APPLICATION OF WOUND VAC;  Surgeon: Newt Minion, MD;  Location: [place];  Service: Orthopedics;  Laterality: Right;   BIOPSY  07/18/2021   Procedure: BIOPSY;  Surgeon: Eloise Harman, DO;  [facility];  Service: Endoscopy;;   BIOPSY  01/09/2022   Procedure: BIOPSY;  Surgeon: Eloise Harman, DO;  [facility];  Service: Endoscopy;;   COLONOSCOPY WITH PROPOFOL N/A 07/18/2021   Carver: 15 millimeter polyp removed from the sigmoid colon, 5 mm polyp removed from sigmoid colon.  Nonbleeding internal hemorrhoids.  Significant looping of the colon. sigmoid path showed invasive colonic adenocarcinoma involving tubular adenoma (invades to depth of 64m, carcinoma 12mfrom margin, no lymphovascular invasion, no poorly differentiated component.   CORONARY ARTERY BYPASS GRAFT N/A 03/13/2020   Procedure: CORONARY ARTERY BYPASS GRAFTING (CABG) times five using bilateral Internal mammary arteries and left radial artery;  Surgeon: AtWonda OldsMD;  [place] Service: Open Heart Surgery;  Laterality: N/A;   DENTAL SURGERY     ESOPHAGOGASTRODUODENOSCOPY (EGD) WITH PROPOFOL N/A 07/18/2021   Carver: 1 gastric polyp status post biopsy, gastritis. gastric bx with slight chronic inflammation and no H.pyori. GEJ polypectomy with mild inflammation only   FLEXIBLE SIGMOIDOSCOPY N/A 08/26/2021   Carver: Nonbleeding internal hemorrhoids.  15 mm ulcers from previous polypectomy found in the rectum.  No evidence of previous polyp.  Located 5 to 8 cm from anal verge.   FLEXIBLE SIGMOIDOSCOPY N/A 01/09/2022   Procedure: FLEXIBLE SIGMOIDOSCOPY;  Surgeon: CaEloise HarmanDO;  [facility];  Service: Endoscopy;  Laterality: N/A;   POLYPECTOMY  07/18/2021   Procedure: POLYPECTOMY INTESTINAL;  Surgeon: CaEloise HarmanDO;  [facility];  Service: Endoscopy;;   RADIAL ARTERY HARVEST Left 03/13/2020   Procedure: R[place]  Surgeon: AtWonda OldsMD;  [place] Service: Open Heart Surgery;  Laterality: Left;   RIGHT/LEFT HEART CATH AND CORONARY ANGIOGRAPHY N/A 03/07/2020   Procedure: RIGHT/LEFT HEART CATH AND CORONARY ANGIOGRAPHY;  Surgeon: CoSherren MochaMD;  [facility];  Service: Cardiovascular;  Laterality:  N/A;   SUBMUCOSAL LIFTING INJECTION  01/09/2022   Procedure: SUBMUCOSAL LIFTING INJECTION;  Surgeon: Eloise Harman, DO;  [facility];  Service: Endoscopy;;   SUBMUCOSAL TATTOO INJECTION  01/09/2022   Procedure: SUBMUCOSAL TATTOO INJECTION;  Surgeon: Eloise Harman, DO;  [facility];  Service: Endoscopy;;   TEE WITHOUT CARDIOVERSION N/A 03/13/2020   Procedure: TRANSESOPHAGEAL ECHOCARDIOGRAM (TEE);  Surgeon: [name], MD;  Location: [place];  Service: Open Heart Surgery;  Laterality: N/A;   Family History:  Family History  Problem Relation Age of Onset   Hypertension Mother    Family Psychiatric History: not noted Social History:   Social History   Substance and Sexual Activity  Alcohol Use Not Currently   Comment: rarely     Social History   Substance and Sexual Activity  Drug Use No    Social History   Socioeconomic History   Marital status: Single    Spouse name: Not on file   Number of children: Not on file   Years of education: Not on file   Highest education level: Not on file  Occupational History   Not on file  Tobacco Use   Smoking status: Former    Packs/day: 1.50    Types: Cigarettes    Quit date: 05/25/1995    Years since quitting: 27.4   Smokeless tobacco: Never  Vaping Use   Vaping Use: Never used  Substance and Sexual Activity   Alcohol use: Not Currently    Comment: rarely   Drug use: No   Sexual activity: Not on file  Other Topics Concern   Not on file  Social History Narrative   Not on file   Social Determinants of Health   Financial Resource Strain: Not on file  Food Insecurity: No Food Insecurity (09/26/2022)   Hunger Vital Sign    Worried About Running Out of Food in the Last Year: Never true    Ran Out of Food in the Last Year: Never true  Transportation Needs: No Transportation Needs (09/26/2022)   PRAPARE - Hydrologist (Medical): No    Lack of Transportation (Non-Medical): No  Physical Activity: Not on file  Stress: Not on file  Social Connections: Not on file   Additional Social History:  Allergies:   Allergies  Allergen Reactions   Ace Inhibitors Swelling and Cough    (Not on MAR at [facility])   Haloperidol And Related     Do NOT give anti-psychotics due to risk of  Torsades and QT prolongation (per Dr. Graciela Husbands request)   Other Itching    [name]   Shellfish Allergy    Penicillins Itching and Rash    Has patient had a PCN reaction causing immediate rash, facial/tongue/throat swelling, SOB or lightheadedness with hypotension: Unknown Has patient had a PCN reaction causing severe rash  involving mucus membranes or skin necrosis: Unknown Has patient had a PCN reaction that required hospitalization: Unknown Has patient had a PCN reaction occurring within the last 10 years: Unknown If all of the above answers are "NO", then may proceed with Cephalosporin use.     Labs: No results found for this or any previous visit (from the past 48 hour(s)).  Medications:  Current Facility-Administered Medications  Medication Dose Route Frequency Provider Last Rate Last Admin   acetaminophen (TYLENOL) tablet 1,000 mg  1,000 mg Oral Q8H PRN Tretha Sciara, MD  albuterol (PROVENTIL) (2.5 MG/3ML) 0.083% nebulizer solution 2.5 mg  2.5 mg Nebulization Q6H PRN Madueme, Elvira C, RPH       amLODipine (NORVASC) tablet 10 mg  10 mg Oral Daily Countryman, Chase, MD   10 mg at 11/01/22 1018   ARIPiprazole (ABILIFY) tablet 5 mg  5 mg Oral Daily Leevy-Johnson, Lilygrace Rodick A, NP   5 mg at 11/01/22 1018   aspirin EC tablet 81 mg  81 mg Oral Q breakfast Tretha Sciara, MD   81 mg at 11/01/22 0709   atorvastatin (LIPITOR) tablet 80 mg  80 mg Oral QHS Tretha Sciara, MD   80 mg at 11/01/22 2257   bacitracin ointment   Topical BID Wyvonnia Dusky, MD   Given at 11/01/22 2136   cephALEXin (KEFLEX) capsule 500 mg  500 mg Oral Q8H Noemi Chapel, MD   500 mg at 11/02/22 0543   docusate sodium (COLACE) capsule 100 mg  100 mg Oral Daily Noemi Chapel, MD   100 mg at 11/01/22 1018   feeding supplement (ENSURE ENLIVE / ENSURE PLUS) liquid 237 mL  237 mL Oral TID BM Noemi Chapel, MD   237 mL at 11/02/22 1517   hydrOXYzine (ATARAX) tablet 25 mg  25 mg Oral TID PRN Laretta Bolster, FNP       levothyroxine (SYNTHROID) tablet 75 mcg  75 mcg Oral QAC breakfast Noemi Chapel, MD   75 mcg at 11/02/22 0543   magnesium oxide (MAG-OX) tablet 400 mg  400 mg Oral Daily Noemi Chapel, MD   400 mg at 11/01/22 1019   metFORMIN (GLUCOPHAGE) tablet 1,000 mg  1,000 mg Oral BID WC Madueme, Elvira C, RPH   1,000 mg at 11/01/22  1624   metoprolol tartrate (LOPRESSOR) tablet 100 mg  100 mg Oral BID Noemi Chapel, MD   100 mg at 11/01/22 2141   mirtazapine (REMERON SOL-TAB) disintegrating tablet 30 mg  30 mg Oral QHS Leevy-Johnson, Nima Kemppainen A, NP   30 mg at 11/01/22 2257   mometasone-formoterol (DULERA) 200-5 MCG/ACT inhaler 2 puff  2 puff Inhalation BID Tretha Sciara, MD   2 puff at 11/02/22 0933   oxyCODONE-acetaminophen (PERCOCET/ROXICET) 5-325 MG per tablet 1 tablet  1 tablet Oral Q6H PRN Godfrey Pick, MD   1 tablet at 11/01/22 1624   pantoprazole (PROTONIX) EC tablet 40 mg  40 mg Oral Daily Noemi Chapel, MD   40 mg at 11/01/22 1017   potassium chloride SA (KLOR-CON M) CR tablet 20 mEq  20 mEq Oral Daily Countryman, Cheri Rous, MD   20 mEq at 11/01/22 1018   tamsulosin (FLOMAX) capsule 0.8 mg  0.8 mg Oral Daily Noemi Chapel, MD   0.8 mg at 11/01/22 1018   thiamine (VITAMIN B1) tablet 100 mg  100 mg Oral Daily Countryman, Cheri Rous, MD   100 mg at 11/01/22 1019   torsemide (DEMADEX) tablet 40 mg  40 mg Oral Daily Countryman, Cheri Rous, MD   40 mg at 11/01/22 1023   traZODone (DESYREL) tablet 50 mg  50 mg Oral QHS PRN Laretta Bolster, FNP   50 mg at 10/30/22 2304   Current Outpatient Medications  Medication Sig Dispense Refill   acetaminophen (TYLENOL) 500 MG tablet Take 1,000 mg by mouth every 8 (eight) hours as needed for fever or moderate pain.     albuterol (VENTOLIN HFA) 108 (90 Base) MCG/ACT inhaler Inhale 2 puffs into the lungs every 6 (six) hours as needed for wheezing or shortness of breath.  alum & mag hydroxide-simeth (MAALOX/MYLANTA) 200-200-20 MG/5ML suspension Take 30 mLs by mouth every 6 (six) hours as needed for indigestion or heartburn. 355 mL 0   amLODipine (NORVASC) 10 MG tablet Take 1 tablet (10 mg total) by mouth daily.     aspirin 81 MG EC tablet Take 1 tablet (81 mg total) by mouth daily with breakfast. 30 tablet 12   atorvastatin (LIPITOR) 80 MG tablet Take 80 mg by mouth at bedtime.      budesonide-formoterol (SYMBICORT) 160-4.5 MCG/ACT inhaler Inhale 2 puffs into the lungs in the morning and at bedtime. 1 Inhaler 6   Cholecalciferol 125 MCG (5000 UT) TABS Take 1 tablet by mouth once a week. On Mondays     clotrimazole-betamethasone (LOTRISONE) cream Apply 1 Application topically 2 (two) times daily.     docusate sodium (COLACE) 100 MG capsule Take 100 mg by mouth daily.     FERROUS GLUCONATE PO Take 324 mg by mouth daily.     insulin aspart (NOVOLOG FLEXPEN) 100 UNIT/ML FlexPen 0-15 Units, Subcutaneous, 3 times daily with meals CBG < 70: implement hypoglycemia protocol-call MD CBG 70 - 120: 0 units CBG 121 - 150: 2 units CBG 151 - 200: 3 units CBG 201 - 250: 5 units CBG 251 - 300: 8 units CBG 301 - 350: 11 units CBG 351 - 400: 15 units CBG > 400: 15 mL 0   ipratropium-albuterol (DUONEB) 0.5-2.5 (3) MG/3ML SOLN Take 3 mLs by nebulization every 6 (six) hours as needed. 360 mL    levothyroxine (SYNTHROID) 75 MCG tablet Take 75 mcg by mouth daily before breakfast.     loperamide HCl (IMODIUM) 2 MG/15ML solution Take 4 mg by mouth every 6 (six) hours as needed for diarrhea or loose stools.     magnesium oxide (MAG-OX) 400 MG tablet Take 400 mg by mouth daily.     metFORMIN (GLUCOPHAGE) 1000 MG tablet Take 1,000 mg by mouth 2 (two) times daily.     metoprolol tartrate (LOPRESSOR) 100 MG tablet Take 1 tablet (100 mg total) by mouth 2 (two) times daily.     Multiple Vitamin (MULTIVITAMIN) tablet Take 1 tablet by mouth daily.     omeprazole (PRILOSEC) 40 MG capsule Take 40 mg by mouth daily.     polyethylene glycol (MIRALAX / GLYCOLAX) 17 g packet Take 17 g by mouth daily as needed for moderate constipation.     polyvinyl alcohol (LIQUIFILM TEARS) 1.4 % ophthalmic solution Place 1 drop into both eyes daily.     potassium chloride SA (KLOR-CON M) 20 MEQ tablet Take 1 tablet (20 mEq total) by mouth daily. 90 tablet 1   senna-docusate (SENOKOT-S) 8.6-50 MG tablet Take 1 tablet by  mouth 2 (two) times daily.     tamsulosin (FLOMAX) 0.4 MG CAPS capsule Take 0.8 mg by mouth daily.     thiamine (VITAMIN B-1) 100 MG tablet Take 1 tablet (100 mg total) by mouth daily.     torsemide (DEMADEX) 20 MG tablet Take 2 tablets (40 mg total) by mouth daily. 90 tablet 6   Amino Acids-Protein Hydrolys (PRO-STAT AWC PO) Take 30 mLs by mouth 2 (two) times daily.     feeding supplement (ENSURE ENLIVE / ENSURE PLUS) LIQD Take 237 mLs by mouth 3 (three) times daily between meals. 237 mL 12   Musculoskeletal: Strength & Muscle Tone: decreased Gait & Station: normal Patient leans: N/A  Psychiatric Specialty Exam:  Presentation  General Appearance:  Casual  Eye Contact: Good  Speech: Clear and Coherent  Speech Volume: Normal  Handedness: Right  Mood and Affect  Mood: Dysphoric  Affect: Congruent; Appropriate  Thought Process  Thought Processes: Coherent  Descriptions of Associations:Intact  Orientation:Full (Time, Place and Person)  Thought Content:Logical; Perseveration  History of Schizophrenia/Schizoaffective disorder:No  Duration of Psychotic Symptoms:N/A  Hallucinations:Hallucinations: Auditory Description of Auditory Hallucinations: 'same, calling me names'  Ideas of Reference:None  Suicidal Thoughts:Suicidal Thoughts: Yes, Passive SI Passive Intent and/or Plan: Without Intent; Without Plan  Homicidal Thoughts:Homicidal Thoughts: No  Sensorium  Memory: Immediate Good; Recent Good  Judgment: Fair  Insight: Shallow  Executive Functions  Concentration: Fair  Attention Span: Fair  Recall: [place] of Knowledge: Fair  Language: Fair  Psychomotor Activity  Psychomotor Activity:Psychomotor Activity: Normal  Assets  Assets: Resilience; Financial Resources/Insurance; Communication Skills  Sleep  Sleep:Sleep: Fair  Physical Exam: Physical Exam Vitals and nursing note reviewed.  Constitutional:      General: He is not in  acute distress.    Appearance: He is obese. He is ill-appearing.  HENT:     Head: Normocephalic.     Nose: Nose normal.     Mouth/Throat:     Mouth: Mucous membranes are moist.     Pharynx: Oropharynx is clear.  Eyes:     Pupils: Pupils are equal, round, and reactive to light.  Cardiovascular:     Rate and Rhythm: Normal rate.     Pulses: Normal pulses.  Pulmonary:     Effort: Pulmonary effort is normal.  Skin:    General: Skin is dry.  Neurological:     Mental Status: He is oriented to person, place, and time. Mental status is at baseline.  Psychiatric:        Attention and Perception: He perceives auditory hallucinations.        Mood and Affect: Affect normal. Mood is depressed.        Speech: Speech normal.        Behavior: Behavior is cooperative.        Thought Content: Thought content is not paranoid or delusional. Thought content does not include homicidal ideation. Thought content does not include homicidal or suicidal plan.        Cognition and Memory: Cognition and memory normal.        Judgment: Judgment normal.    Review of Systems  Psychiatric/Behavioral:  Positive for depression and hallucinations. The patient has insomnia.   All other systems reviewed and are negative.  Blood pressure 138/60, pulse 66, temperature 98.6 F (37 C), temperature source Oral, resp. rate 20, height '5\' 9"'$  (1.753 m), weight 117.5 kg, SpO2 100 %. Body mass index is 38.25 kg/m.  Treatment Plan Summary: Daily contact with patient to assess and evaluate symptoms and progress in treatment, Medication management, and Plan   PLAN:  Medications:  Major Depressive Disorder with psychotic features Continue:   - Mirtazepine 30 mg daily HS:  depressive symptoms  - Abilify 5 mg daily: depression,  auditory hallucinations.    - EKG ordered; pending PRN:  Hydroxyzine 25 mg TID PRN anxiety Trazodone 50 mg HS PRN sleep  Disposition: Supportive therapy provided about ongoing  stressors. Discussed crisis plan, support from social network, calling 911, coming to the Emergency Department, and calling Suicide Hotline.  This service was provided via telemedicine using a 2-way, interactive audio and video technology.  Names of all persons participating in this telemedicine service and their role in this encounter. Name: Oneida Alar Role: PMHNP  Name: Nadir Attiah Role: Attending MD  Name: Marny Lowenstein Role: patient  Name:  Role:     Inda Merlin, NP 11/02/2022 10:08 AM

## 2022-11-02 NOTE — Progress Notes (Signed)
Per Percell Boston, patient meets criteria for inpatient treatment. There are no available beds at [facility] today. CSW faxed referrals to the following facilities for review:  [place]  Accepted N/A [address] [place] [address] [phone] --  [facility]  Pending - Request Sent N/A [address] [place] [address] [phone] --  [place] [address] [place] [address] [phone] --  [place] N/A [place], [place] [address] [phone] --  [place], [place] [address] [phone] --  [address] [place] [address] [phone] --  [address] [place] [address] [phone] --  [facility]  Pending - Request Sent N/A [place], [place] [address] [phone] --  [facility]  Pending - Request Sent N/A [address] [place], [place] [address] [phone] --  [facility]  Pending - Request Sent N/A [place] [place] [address] [phone] --  [place] [address] [place] [address] [phone] --  [place]  Pending - Request Sent N/A [address] [place] [address] [phone] --  [facility] [address] [place] [address] [phone] --  [facility]  Pending - Request Sent  N/A [address], [place] [address] [phone] --  [facility]  Pending - Request Sent N/A [address], [place] [address] [phone] --  [place] No Request Sent N/A [address], [place] [address] [phone] --  [facility]  Pending - No Request Sent N/A [place], [place] [address] [phone] --  [facility]  Pending - No Request Sent N/A [address] [place] [address] [facility]  Pending - No Request Sent N/A [place], [place] [address] [place] --  [facility]  Pending - No Request Sent N/A [address] [place] [address] [phone] --  [facility]  Pending - No Request Sent N/A [address] [place] [address] [phone] --    TTS will continue to seek bed placement.  [place]ie [place], MSW, [place] Phone: [phone] Disposition/TOC

## 2022-11-02 NOTE — ED Notes (Signed)
Pt received dinner tray.

## 2022-11-02 NOTE — ED Notes (Signed)
Pt received lunch tray 

## 2022-11-02 NOTE — ED Notes (Addendum)
Pt asked for a ensure. This sitter provided a chocolate ensure.

## 2022-11-02 NOTE — ED Provider Notes (Signed)
Emergency Medicine Observation Re-evaluation Note  Shane Chambers is a 61 y.o. male, seen on rounds today.  Pt initially presented to the ED for complaints of Suicidal Currently, the patient is watching TV.  Physical Exam  BP 138/60 (BP Location: Right Arm)   Pulse 66   Temp 98.6 F (37 C) (Oral)   Resp 20   Ht '5\' 9"'$  (1.753 m)   Wt 117.5 kg   SpO2 98%   BMI 38.25 kg/m  Physical Exam General: Awake, alert, nondistressed Cardiac: Normal rate Lungs: Breathing is unlabored Psych: No agitation  ED Course / MDM  EKG:   I have reviewed the labs performed to date as well as medications administered while in observation.  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  Patient currently on Keflex and bacitracin.  Plan  Current plan is for inpatient psychiatric admission.    Godfrey Pick, MD 11/02/22 [phone]

## 2022-11-03 DIAGNOSIS — F333 Major depressive disorder, recurrent, severe with psychotic symptoms: Secondary | ICD-10-CM | POA: Diagnosis not present

## 2022-11-03 NOTE — ED Provider Notes (Signed)
Emergency Medicine Observation Re-evaluation Note  Shane Chambers is a 61 y.o. male, seen on rounds today.  Pt initially presented to the ED for complaints of Suicidal Currently, the patient is calm and cooperative.  Physical Exam  BP 137/65   Pulse 68   Temp 98 F (36.7 C) (Oral)   Resp 18   Ht 1.753 m ('5\' 9"'$ )   Wt 117.5 kg   SpO2 98%   BMI 38.25 kg/m  Physical Exam General: No acute distress Cardiac: Regular rate Lungs: Normal respiratory effort Psych:   ED Course / MDM  EKG:   I have reviewed the labs performed to date as well as medications administered while in observation.  Recent changes in the last 24 hours include no acute events overnight..  Plan  Current plan is for inpatient treatment.    Dorie Rank, MD 11/03/22 [phone]

## 2022-11-03 NOTE — ED Notes (Signed)
Pt given dinner tray.

## 2022-11-03 NOTE — Progress Notes (Signed)
CSW Disposition Team requested that pt be reviewed for [facility] BMU during day shift.   At 9:17pm this CSW requested that [facility] BM[facility] review pt. CSW will contuine to assist and follow with placement.   Care Team notified: Denna Haggard, LCSWA, Alethia Berthold, MD, [place], FNP, Lyn Henri, RN, Princess Perna, [place], MSW, [facility] 11/03/2022 9:30 PM

## 2022-11-03 NOTE — Progress Notes (Signed)
LCSW Progress Note  [id number]   Shane Chambers  11/03/2022  9:03 AM  Description:   Inpatient Psychiatric Referral  Patient was recommended inpatient per Oneida Alar, NP. There are no available beds at [facility]. Patient was referred to the following facilities:   Destination  Service Provider Address Phone Fax  [facility]  [address] [place] [address] [phone]  [place], [place] [address] [place]  [facility]  [address] [place] [address] [phone] [place]  [place], [place] [address] [phone]  [facility]  [place], [place] [address] [place]  [facility] [address] [place] [address] [phone]  [facility]  [address], [place] [address] [phone]  [facility]  [place], [place] [address] [phone] [facility]  [address] [place], [place] [address] [phone]  [facility]  [address] [address] [phone]  [facility]  [address], [place] [address] [phone] [place]  [address], [place] [address] [phone]  [facility]  [address], [place] [address] [phone] [facility]  [address], [place] [address] [phone]  [facility]  [address], [place] [address] [phone] [address]  [place] [address] [phone]  [facility]  [place], [place] [address] [phone]  [facility]  [address] [place] [address] [phone] [facility]  [address], [place] [address] [phone] [facility]  [address] [place] [address] [phone]  [facility]  [address] [place]  [address] [phone]      Situation ongoing, CSW to continue following and update chart as more information becomes available.      Denna Haggard, [place]  11/03/2022 9:03 AM

## 2022-11-03 NOTE — ED Notes (Signed)
  Patient requested dressing change for recent right sided BKA.  Patient had taken off bandage and was picking at wound causing it to bleed.  I cleaned the area, applied bacitracin and wrapped it back up.  Spoke with patient and asked that he stop picking with it to keep it from getting more infected.  Patient states he understood but "its hard not to when I am just sitting here all day".

## 2022-11-04 ENCOUNTER — Institutional Professional Consult (permissible substitution): Payer: Medicare Other | Admitting: Internal Medicine

## 2022-11-04 DIAGNOSIS — F333 Major depressive disorder, recurrent, severe with psychotic symptoms: Secondary | ICD-10-CM | POA: Diagnosis not present

## 2022-11-04 LAB — CBG MONITORING, ED: Glucose-Capillary: 112 mg/dL — ABNORMAL HIGH (ref 70–99)

## 2022-11-04 NOTE — Progress Notes (Addendum)
CSW sent referral to [facility] at [phone] for review. CSW will continue to assist and follow with placement.   Denna Haggard, [place]  11/04/2022 2:57 PM

## 2022-11-04 NOTE — Progress Notes (Signed)
Patient has been denied by [facility] due to no appropriate beds available. Patient meets [place] inpatient criteria per Oneida Alar, NP. Patient has been faxed out to the following facilities:   [facility]  [address] [place] [address] [place]  [facility]  [address] [place] [address] [phone]  [facility]  [place], [place] [address] [phone]  [facility]  Red Cross, [place] [address] [phone]  [facility] [address] [place] [address] [phone]  [facility]  [address], [place] [address] [phone]  [facility]  [place], [place] [address] [phone] [facility]  [address] [place], [place] [address] [phone]  [facility]  [address] [place] [address] [phone]  [facility]  [address], [place] [address] [phone] [facility]  [address] [place] [address] [phone] [name]  [address] [place] [address] [phone]  [facility]  [address] [place], [place] [address] [phone] [facility]  [address] [place] [address] [phone]  [facility]  [address], [place] [address] [phone] [address] [place] [address] [phone]  [facility]  [place], [place] [address] [phone]  [facility]  [address] [place] [address] [facility]  [address] [place] [address] [phone] [facility]  [address] [place] [address] [phone]  [facility]  [place], [place] [address] [place]  [place] [place] [address] [phone] [facility]  [address] [place] [address] [address] [place] [address] [place]  [facility]  [address] [place] [address] [phone]  [facility]  [phone] [place] [address] [phone] [place]  [address], [place] [address] [phone]  [place] [address] [place] [address] [place]    Mariea Clonts, MSW, LCSW-A  10:34 PM 11/04/2022

## 2022-11-04 NOTE — Progress Notes (Signed)
CSW inquired about pt being reviewed for [facility] BMU. At this time, CSW is awaiting follow up.  Denna Haggard, [place]  11/04/2022 2:19 PM

## 2022-11-04 NOTE — Progress Notes (Signed)
BHH/BMU LCSW Progress Note   11/04/2022    11:03 PM  Shane Chambers   [id number]   Type of Contact and Topic:  Psychiatric Bed Placement   Pt accepted to [facility]    Patient meets inpatient criteria per Oneida Alar, NP   The attending provider will be Merrie Roof, MD  Call report to [phone]  Verlene Mayer, RN @ [facility] ED notified.     Pt scheduled  to arrive at [place].    Mariea Clonts, MSW, LCSW-A  11:05 PM 11/04/2022

## 2022-11-05 ENCOUNTER — Inpatient Hospital Stay
Admission: AD | Admit: 2022-11-05 | Discharge: 2022-11-11 | DRG: 885 | Disposition: A | Discharge status: Other Acute Inpatient Hospital | Payer: Medicare Other | Source: Intra-hospital | Attending: Internal Medicine | Admitting: Internal Medicine

## 2022-11-05 ENCOUNTER — Other Ambulatory Visit: Payer: Self-pay

## 2022-11-05 ENCOUNTER — Encounter: Payer: Self-pay | Admitting: Psychiatry

## 2022-11-05 DIAGNOSIS — M8610 Other acute osteomyelitis, unspecified site: Secondary | ICD-10-CM | POA: Diagnosis present

## 2022-11-05 DIAGNOSIS — E039 Hypothyroidism, unspecified: Secondary | ICD-10-CM | POA: Diagnosis present

## 2022-11-05 DIAGNOSIS — I10 Essential (primary) hypertension: Secondary | ICD-10-CM | POA: Diagnosis present

## 2022-11-05 DIAGNOSIS — T8743 Infection of amputation stump, right lower extremity: Secondary | ICD-10-CM | POA: Diagnosis not present

## 2022-11-05 DIAGNOSIS — Z91013 Allergy to seafood: Secondary | ICD-10-CM | POA: Diagnosis not present

## 2022-11-05 DIAGNOSIS — E7800 Pure hypercholesterolemia, unspecified: Secondary | ICD-10-CM | POA: Diagnosis present

## 2022-11-05 DIAGNOSIS — I5043 Acute on chronic combined systolic (congestive) and diastolic (congestive) heart failure: Secondary | ICD-10-CM | POA: Diagnosis present

## 2022-11-05 DIAGNOSIS — A4902 Methicillin resistant Staphylococcus aureus infection, unspecified site: Secondary | ICD-10-CM | POA: Diagnosis not present

## 2022-11-05 DIAGNOSIS — Z951 Presence of aortocoronary bypass graft: Secondary | ICD-10-CM | POA: Diagnosis not present

## 2022-11-05 DIAGNOSIS — E1151 Type 2 diabetes mellitus with diabetic peripheral angiopathy without gangrene: Secondary | ICD-10-CM | POA: Diagnosis present

## 2022-11-05 DIAGNOSIS — C20 Malignant neoplasm of rectum: Secondary | ICD-10-CM | POA: Diagnosis present

## 2022-11-05 DIAGNOSIS — Z9151 Personal history of suicidal behavior: Secondary | ICD-10-CM | POA: Diagnosis not present

## 2022-11-05 DIAGNOSIS — E119 Type 2 diabetes mellitus without complications: Secondary | ICD-10-CM

## 2022-11-05 DIAGNOSIS — I252 Old myocardial infarction: Secondary | ICD-10-CM

## 2022-11-05 DIAGNOSIS — Z79899 Other long term (current) drug therapy: Secondary | ICD-10-CM

## 2022-11-05 DIAGNOSIS — Z79890 Hormone replacement therapy (postmenopausal): Secondary | ICD-10-CM

## 2022-11-05 DIAGNOSIS — R9431 Abnormal electrocardiogram [ECG] [EKG]: Secondary | ICD-10-CM | POA: Diagnosis not present

## 2022-11-05 DIAGNOSIS — Z87891 Personal history of nicotine dependence: Secondary | ICD-10-CM

## 2022-11-05 DIAGNOSIS — I2510 Atherosclerotic heart disease of native coronary artery without angina pectoris: Secondary | ICD-10-CM | POA: Diagnosis present

## 2022-11-05 DIAGNOSIS — Z8249 Family history of ischemic heart disease and other diseases of the circulatory system: Secondary | ICD-10-CM | POA: Diagnosis not present

## 2022-11-05 DIAGNOSIS — F332 Major depressive disorder, recurrent severe without psychotic features: Secondary | ICD-10-CM | POA: Diagnosis present

## 2022-11-05 DIAGNOSIS — I5042 Chronic combined systolic (congestive) and diastolic (congestive) heart failure: Secondary | ICD-10-CM | POA: Diagnosis present

## 2022-11-05 DIAGNOSIS — Z880 Allergy status to penicillin: Secondary | ICD-10-CM

## 2022-11-05 DIAGNOSIS — Z7984 Long term (current) use of oral hypoglycemic drugs: Secondary | ICD-10-CM

## 2022-11-05 DIAGNOSIS — E1169 Type 2 diabetes mellitus with other specified complication: Secondary | ICD-10-CM | POA: Diagnosis present

## 2022-11-05 DIAGNOSIS — Z7982 Long term (current) use of aspirin: Secondary | ICD-10-CM

## 2022-11-05 DIAGNOSIS — L03031 Cellulitis of right toe: Secondary | ICD-10-CM | POA: Diagnosis not present

## 2022-11-05 DIAGNOSIS — F333 Major depressive disorder, recurrent, severe with psychotic symptoms: Secondary | ICD-10-CM | POA: Diagnosis not present

## 2022-11-05 DIAGNOSIS — I110 Hypertensive heart disease with heart failure: Secondary | ICD-10-CM | POA: Diagnosis present

## 2022-11-05 DIAGNOSIS — Z794 Long term (current) use of insulin: Secondary | ICD-10-CM

## 2022-11-05 DIAGNOSIS — J449 Chronic obstructive pulmonary disease, unspecified: Secondary | ICD-10-CM | POA: Diagnosis present

## 2022-11-05 DIAGNOSIS — K219 Gastro-esophageal reflux disease without esophagitis: Secondary | ICD-10-CM | POA: Diagnosis present

## 2022-11-05 DIAGNOSIS — Z85048 Personal history of other malignant neoplasm of rectum, rectosigmoid junction, and anus: Secondary | ICD-10-CM

## 2022-11-05 DIAGNOSIS — R45851 Suicidal ideations: Secondary | ICD-10-CM | POA: Diagnosis present

## 2022-11-05 DIAGNOSIS — D649 Anemia, unspecified: Secondary | ICD-10-CM | POA: Diagnosis present

## 2022-11-05 DIAGNOSIS — E669 Obesity, unspecified: Secondary | ICD-10-CM | POA: Diagnosis present

## 2022-11-05 DIAGNOSIS — I255 Ischemic cardiomyopathy: Secondary | ICD-10-CM | POA: Diagnosis present

## 2022-11-05 DIAGNOSIS — Y835 Amputation of limb(s) as the cause of abnormal reaction of the patient, or of later complication, without mention of misadventure at the time of the procedure: Secondary | ICD-10-CM | POA: Diagnosis not present

## 2022-11-05 DIAGNOSIS — R0602 Shortness of breath: Secondary | ICD-10-CM | POA: Diagnosis present

## 2022-11-05 DIAGNOSIS — E11649 Type 2 diabetes mellitus with hypoglycemia without coma: Secondary | ICD-10-CM

## 2022-11-05 DIAGNOSIS — E1159 Type 2 diabetes mellitus with other circulatory complications: Secondary | ICD-10-CM | POA: Diagnosis not present

## 2022-11-05 DIAGNOSIS — Z7951 Long term (current) use of inhaled steroids: Secondary | ICD-10-CM

## 2022-11-05 DIAGNOSIS — T8740 Infection of amputation stump, unspecified extremity: Secondary | ICD-10-CM | POA: Diagnosis not present

## 2022-11-05 LAB — GLUCOSE, CAPILLARY: Glucose-Capillary: 146 mg/dL — ABNORMAL HIGH (ref 70–99)

## 2022-11-05 MED ORDER — ALUM & MAG HYDROXIDE-SIMETH 200-200-20 MG/5ML PO SUSP: 30.0000 mL | ORAL | Status: DC | PRN

## 2022-11-05 MED ORDER — METFORMIN HCL 500 MG PO TABS
1000.0000 mg | ORAL_TABLET | Freq: Two times a day (BID) | ORAL | Status: DC
  Administered 2022-11-06 – 2022-11-10 (×10): 1000 mg via ORAL
  Filled 2022-11-05 (×10): qty 2

## 2022-11-05 MED ORDER — ATORVASTATIN CALCIUM 80 MG PO TABS
80.0000 mg | ORAL_TABLET | Freq: Every day | ORAL | Status: DC
  Administered 2022-11-06 – 2022-11-10 (×5): 80 mg via ORAL
  Filled 2022-11-05 (×5): qty 1

## 2022-11-05 MED ORDER — FLUTICASONE FUROATE-VILANTEROL 100-25 MCG/ACT IN AEPB
1.0000 | INHALATION_SPRAY | Freq: Every day | RESPIRATORY_TRACT | Status: DC
  Administered 2022-11-06 – 2022-11-10 (×5): 1 via RESPIRATORY_TRACT
  Filled 2022-11-05: qty 28

## 2022-11-05 MED ORDER — ALBUTEROL SULFATE HFA 108 (90 BASE) MCG/ACT IN AERS
2.0000 | INHALATION_SPRAY | Freq: Four times a day (QID) | RESPIRATORY_TRACT | Status: DC | PRN
  Administered 2022-11-06: 2 via RESPIRATORY_TRACT
  Filled 2022-11-05: qty 6.7

## 2022-11-05 MED ORDER — SENNOSIDES-DOCUSATE SODIUM 8.6-50 MG PO TABS: 1.0000 | ORAL_TABLET | Freq: Every evening | ORAL | Status: DC | PRN

## 2022-11-05 MED ORDER — MAGNESIUM HYDROXIDE 400 MG/5ML PO SUSP
30.0000 mL | Freq: Every day | ORAL | Status: DC | PRN
  Filled 2022-11-05: qty 30

## 2022-11-05 MED ORDER — TORSEMIDE 20 MG PO TABS
20.0000 mg | ORAL_TABLET | Freq: Every day | ORAL | Status: DC
  Administered 2022-11-06 – 2022-11-10 (×5): 20 mg via ORAL
  Filled 2022-11-05 (×5): qty 1

## 2022-11-05 MED ORDER — METOPROLOL TARTRATE 25 MG PO TABS
100.0000 mg | ORAL_TABLET | Freq: Two times a day (BID) | ORAL | Status: DC
  Administered 2022-11-05 – 2022-11-06 (×3): 100 mg via ORAL
  Filled 2022-11-05 (×4): qty 4

## 2022-11-05 MED ORDER — DOCUSATE SODIUM 100 MG PO CAPS
100.0000 mg | ORAL_CAPSULE | Freq: Two times a day (BID) | ORAL | Status: DC
  Administered 2022-11-05 – 2022-11-08 (×5): 100 mg via ORAL
  Filled 2022-11-05 (×7): qty 1

## 2022-11-05 MED ORDER — POTASSIUM CHLORIDE CRYS ER 20 MEQ PO TBCR
20.0000 meq | EXTENDED_RELEASE_TABLET | Freq: Every day | ORAL | Status: DC
  Administered 2022-11-06 – 2022-11-10 (×5): 20 meq via ORAL
  Filled 2022-11-05 (×5): qty 1

## 2022-11-05 MED ORDER — LEVOTHYROXINE SODIUM 75 MCG PO TABS
75.0000 ug | ORAL_TABLET | Freq: Every day | ORAL | Status: DC
  Administered 2022-11-06 – 2022-11-10 (×5): 75 ug via ORAL
  Filled 2022-11-05 (×5): qty 1

## 2022-11-05 MED ORDER — MAGNESIUM OXIDE -MG SUPPLEMENT 400 (240 MG) MG PO TABS
400.0000 mg | ORAL_TABLET | Freq: Every day | ORAL | Status: DC
  Administered 2022-11-06 – 2022-11-10 (×5): 400 mg via ORAL
  Filled 2022-11-05 (×5): qty 1

## 2022-11-05 MED ORDER — THIAMINE MONONITRATE 100 MG PO TABS
100.0000 mg | ORAL_TABLET | Freq: Every day | ORAL | Status: DC
  Administered 2022-11-06 – 2022-11-10 (×5): 100 mg via ORAL
  Filled 2022-11-05 (×5): qty 1

## 2022-11-05 MED ORDER — TAMSULOSIN HCL 0.4 MG PO CAPS
0.4000 mg | ORAL_CAPSULE | Freq: Every day | ORAL | Status: DC
  Administered 2022-11-06 – 2022-11-10 (×5): 0.4 mg via ORAL
  Filled 2022-11-05 (×5): qty 1

## 2022-11-05 MED ORDER — ASPIRIN 81 MG PO TBEC
81.0000 mg | DELAYED_RELEASE_TABLET | Freq: Every day | ORAL | Status: DC
  Administered 2022-11-06 – 2022-11-10 (×5): 81 mg via ORAL
  Filled 2022-11-05 (×5): qty 1

## 2022-11-05 MED ORDER — AMLODIPINE BESYLATE 5 MG PO TABS
10.0000 mg | ORAL_TABLET | Freq: Every day | ORAL | Status: DC
  Administered 2022-11-06: 10 mg via ORAL
  Filled 2022-11-05 (×2): qty 2

## 2022-11-05 MED ORDER — PANTOPRAZOLE SODIUM 40 MG PO TBEC
40.0000 mg | DELAYED_RELEASE_TABLET | Freq: Every day | ORAL | Status: DC
  Administered 2022-11-06 – 2022-11-10 (×5): 40 mg via ORAL
  Filled 2022-11-05 (×5): qty 1

## 2022-11-05 MED ORDER — ACETAMINOPHEN 325 MG PO TABS
650.0000 mg | ORAL_TABLET | Freq: Four times a day (QID) | ORAL | Status: DC | PRN
  Administered 2022-11-06 – 2022-11-10 (×5): 650 mg via ORAL
  Filled 2022-11-05 (×5): qty 2

## 2022-11-05 MED ORDER — FERROUS GLUCONATE 324 (38 FE) MG PO TABS
324.0000 mg | ORAL_TABLET | Freq: Every day | ORAL | Status: DC
  Administered 2022-11-06 – 2022-11-10 (×5): 324 mg via ORAL
  Filled 2022-11-05 (×6): qty 1

## 2022-11-05 MED ORDER — POLYETHYLENE GLYCOL 3350 17 G PO PACK
17.0000 g | PACK | Freq: Every day | ORAL | Status: DC
  Administered 2022-11-06: 17 g via ORAL
  Filled 2022-11-05 (×2): qty 1

## 2022-11-05 NOTE — Tx Team (Signed)
Initial Treatment Plan 11/05/2022 11:46 PM Shane Chambers [id number]    PATIENT STRESSORS: Health problems Living situation   PATIENT STRENGTHS: Communication skills  General fund of knowledge    PATIENT IDENTIFIED PROBLEMS: Living situation - patient does not want to return to nursing facility                     DISCHARGE CRITERIA:  Adequate post-discharge living arrangements Improved stabilization in mood, thinking, and/or behavior  PRELIMINARY DISCHARGE PLAN: Return to previous living arrangement  PATIENT/FAMILY INVOLVEMENT: This treatment plan has been presented to and reviewed with the patient, Shane Chambers.  The patient has been given the opportunity to ask questions and make suggestions.  Sunday Spillers, RN 11/05/2022, 11:46 PM

## 2022-11-05 NOTE — ED Notes (Signed)
[facility] transport returned the patient due to [facility] deciding they would not accept him with an open wound on his leg.  Patient assisted back to ED room 17 via wheel chair. Case Management aware.

## 2022-11-05 NOTE — ED Notes (Signed)
Voluntary commitment paperwok signed by patient and faxed to [phone]

## 2022-11-05 NOTE — Progress Notes (Addendum)
CSW inquired about pt being reviewed for [facility] BMU. At this time, CSW is awaiting follow up.  Shane Chambers, [place]  11/05/2022 11:56 AM
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
## 2022-11-05 NOTE — Progress Notes (Addendum)
Pt was accepted to [facility], pending voluntary consent faxed to [phone]. Bed assignment: Rm 28  Pt meets inpatient criteria per [place], NP  Attending Physician will be Alethia Berthold, MD  Report can be called to: [phone]  Pt can arrive after pending items are received  Care Team Notified: Maxie Better, RN, Eloisa Northern, RN, Pennelope Bracken, RN, Nicoletta Ba, Counselor, Marylee [place], RN, Rulon Sera, MD, Alethia Berthold, MD, and [address] LCSWA  [place], [place]  11/05/2022 4:12 PM

## 2022-11-05 NOTE — ED Notes (Addendum)
CSW spoke to [employer] to set up transportation for pt to [facility]. Manager with [facility] to be contacted and work on setting up confirmation of ride. Pt will need to transport in a standard wheelchair not a transport chair. CSW updated RN and ED director of this via secure chat. [facility] to call ED secretary when they have confirmation of ride. TOC signing off.   CSW updated by RN that [facility] is now refusing pt and states ride needs to be called for return to [facility] as they will not accept if he arrives. CSW spoke to [employer] to provide update. CSW also updated that pt arrived to [facility] in a transport chair not a standard wheelchair and that this is not what they are able to accommodate. CSW updated RN's and ED director via secure chat that if a pt goes through [facility] it must be a standard wheelchair used.

## 2022-11-05 NOTE — ED Provider Notes (Signed)
7:35 PM Patient is to be admitted to [facility] today 11/05/22 by Dr. Louis Meckel . Attending Physician will be Dr. Daneil Dolin . Patient has been assigned to room 28, by [place], Loree Fee.   EMTALA completed by myself.    Lianne Cure, DO 96/78/93 1935

## 2022-11-05 NOTE — ED Notes (Signed)
[facility] here to take pt to [facility]

## 2022-11-05 NOTE — Progress Notes (Signed)
CSW spoke with [facility] admissions via phone call. Pt has been declined by [facility] due to no available beds. CSW will continue to assist and follow with placement.  Shane Chambers, Shane Chambers  11/05/2022 11:32 AM

## 2022-11-05 NOTE — BH Assessment (Signed)
Patient is to be admitted to [facility] today 11/05/22 by Dr.  Louis Meckel .  Attending Physician will be Dr.  Daneil Dolin .   Patient has been assigned to room 28, by [place], Loree Fee.    ER staff is aware of the admission:   Seth Bake, Patient Access.

## 2022-11-05 NOTE — Progress Notes (Addendum)
CSW received phone call from [facility] admissions. [facility] of pt due to pt having an open wound. CSW will continue to assist and follow with placement.  Denna Haggard, [place]  11/05/2022 10:49 AM

## 2022-11-05 NOTE — ED Notes (Signed)
[facility] called

## 2022-11-06 LAB — GLUCOSE, CAPILLARY: Glucose-Capillary: 154 mg/dL — ABNORMAL HIGH (ref 70–99)

## 2022-11-06 MED ORDER — VENLAFAXINE HCL ER 75 MG PO CP24
75.0000 mg | ORAL_CAPSULE | Freq: Every day | ORAL | Status: DC
  Administered 2022-11-07 – 2022-11-09 (×3): 75 mg via ORAL
  Filled 2022-11-06 (×3): qty 1

## 2022-11-06 MED ORDER — ARIPIPRAZOLE 5 MG PO TABS
5.0000 mg | ORAL_TABLET | Freq: Every day | ORAL | Status: DC
  Administered 2022-11-06 – 2022-11-09 (×4): 5 mg via ORAL
  Filled 2022-11-06 (×4): qty 1

## 2022-11-06 NOTE — Progress Notes (Addendum)
Patient alert and oriented x4. He is assessed at bedside. Patient has a flat affect. He denies SI, HI, and AVH. He endorses 5/10 pain in his abdomen and "gluteal folds". He says that his stomach has been acting up today and that he had cramping, multiple bowel movements, and some nausea earlier in the day. Patient given Tylenol for pain. Patient is compliant with scheduled medications.  Patient is incontinent of urine and bowel. Patient able to ring call bell to request for assistance. Patient is sometimes able to use a urinal, but is not always able to make it in time. Patient used urinal successfully one time. Patient was found with his bed linens wet and urine on the ground two times. Patient stated that he was trying to sit up when he began urinating. Patient also had two large bowel movements. Peri care provided and barrier cream applied each time. Bed linens changed two times. Patient has stage 1 and 2 pressure ulcers on his buttocks/sacral area. Patient describes the area as feeling "raw" and yells out in pain when the area is cleaned with incontinence wipes.

## 2022-11-06 NOTE — BHH Group Notes (Signed)
[facility] Group Notes:  (Nursing/MHT/Case Management/Adjunct)  Date:  11/06/2022  Time:  2:00 PM  Type of Therapy:   community meeting  Participation Level:  Minimal  Participation Quality:  Drowsy  Affect:  Flat  Cognitive:   sleepy  Insight:  None  Engagement in Group:  None  Modes of Intervention:  Discussion and Education  Summary of Progress/Problems:  Antonieta Pert 11/06/2022, 2:00 PM

## 2022-11-06 NOTE — BHH Counselor (Signed)
CSW attempted to meet with pt for completion of the assessment. Upon entering room, CSW noticed pt only had a scrub shirt on laying in the bed. CSW made introduction and explained that he was there to complete an assessment with pt. Pt stated, "No, not right now. I'm in a bad way." CSW validated those feelings and informed pt that assessment would be attempted at a later point. He agreed. No other concerns expressed. Contact ended without incident.   CSW spoke with staff nurse who informed him that pt has been trying to use the bathroom and has not been able to.  Chalmers Guest. Guerry Bruin, MSW, LCSW, LCAS 11/06/2022 1:45 PM

## 2022-11-06 NOTE — Plan of Care (Signed)
D- Patient alert and oriented. Patient presents with a flat and blunted mood and affect. Patient denies SI, HI, AVH, and pain. Patient complaining of constipation. MD made aware and constipation medication administered. This Probation officer and staff cleaned patient up 7-8 times for bowel movements.  A- Scheduled medications administered to patient, per MD orders. Support and encouragement provided.  Routine safety checks conducted every 15 minutes.  Patient informed to notify staff with problems or concerns.  R- No adverse drug reactions noted. Patient contracts for safety at this time. Patient compliant with medications and treatment plan. Patient receptive, calm, and cooperative. Patient did not interact with others on the uni and was in his room throughout shift except for breakfast and lunch.   Patient remains safe at this time.  Problem: Clinical Measurements: Goal: Will remain free from infection Outcome: Not Progressing   Problem: Activity: Goal: Risk for activity intolerance will decrease Outcome: Not Progressing   Problem: Nutrition: Goal: Adequate nutrition will be maintained Outcome: Not Progressing   Problem: Elimination: Goal: Will not experience complications related to bowel motility Outcome: Not Progressing   Problem: Pain Managment: Goal: General experience of comfort will improve Outcome: Not Progressing   Problem: Skin Integrity: Goal: Risk for impaired skin integrity will decrease Outcome: Not Progressing   Problem: Education: Goal: Emotional status will improve Outcome: Not Progressing Goal: Verbalization of understanding the information provided will improve Outcome: Not Progressing
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
## 2022-11-07 DIAGNOSIS — F332 Major depressive disorder, recurrent severe without psychotic features: Secondary | ICD-10-CM | POA: Diagnosis not present

## 2022-11-07 LAB — LIPID PANEL
Cholesterol: 147 mg/dL (ref 0–200)
HDL: 35 mg/dL — ABNORMAL LOW (ref >40)
LDL Cholesterol: 80 mg/dL (ref 0–99)
Total CHOL/HDL Ratio: 4.2 RATIO
Triglycerides: 159 mg/dL — ABNORMAL HIGH (ref <150)
VLDL: 32 mg/dL (ref 0–40)

## 2022-11-07 MED ORDER — METOPROLOL TARTRATE 25 MG PO TABS
50.0000 mg | ORAL_TABLET | Freq: Two times a day (BID) | ORAL | Status: DC
  Administered 2022-11-07 – 2022-11-10 (×8): 50 mg via ORAL
  Filled 2022-11-07 (×8): qty 2

## 2022-11-07 MED ORDER — GERHARDT'S BUTT CREAM
TOPICAL_CREAM | Freq: Three times a day (TID) | CUTANEOUS | Status: DC
  Administered 2022-11-07 – 2022-11-10 (×5): 1 via TOPICAL
  Filled 2022-11-07: qty 1

## 2022-11-07 MED ORDER — AMLODIPINE BESYLATE 5 MG PO TABS
5.0000 mg | ORAL_TABLET | Freq: Every day | ORAL | Status: DC
  Administered 2022-11-07 – 2022-11-10 (×4): 5 mg via ORAL
  Filled 2022-11-07 (×4): qty 1

## 2022-11-07 NOTE — Progress Notes (Signed)
Contacted by staff on behalf of patient requesting a chaplain. Provided presence and space for patient to share his thoughts and concerns. He asked that someone would come to see him again. I am back on call Sunday 12/31, I will follow up that day, a staff chaplain is available if and when patient request one.

## 2022-11-07 NOTE — Progress Notes (Signed)
Xeroform dressing applied to wounds on bilateral legs and Gerhardts cream applied to buttocks per wound care orders.   Patient turned to his left side, pillow behind his back for support.

## 2022-11-07 NOTE — Progress Notes (Signed)
2 RNs pulled patient up in bed and assisted patient onto his back.   Urinal set in place for patient to void. Call bed in reach.  Bed in lowest position.

## 2022-11-07 NOTE — Progress Notes (Signed)
Patient given urinal to use while sitting on the side of the bed.  Staff returned when patient pressed call bell.  Patient's brief and bed pad soiled.  Patient cleaned, bed pad changed.  Clean brief placed on patient.  Patient sitting on the side of the bed.  Bed in lowest position.  Call bell in reach.

## 2022-11-07 NOTE — Progress Notes (Signed)
Patient's bottom cleaned and cream applied as ordered.  Clean brief applied.  Clean bed pad placed on bed. Patient laying on his right side.

## 2022-11-07 NOTE — Progress Notes (Signed)
Patient cleaned and dressed for breakfast.  3 staff members assisted with getting patient from his bed to geri chair using the hoyer lift.  Patient in dayroom eating breakfast.  Patient's bed cleaned, fresh linen applied.   Patient alert and oriented x 4. Patient reports he did not sleep last night. Pain of 8/10 in his rectum.  Pt denies SI/HI.  Patient also denies AVH since being in the hospital.  Patient is fixated on having a bowel movement.

## 2022-11-07 NOTE — Progress Notes (Addendum)
Patient's bed soiled with urine.  Stool in patient's brief.  Patient cleaned.  Clean sheets, bed pads and brief placed on patient.

## 2022-11-07 NOTE — Progress Notes (Signed)
Patient inappropriate for the unit. He requires 3 person assist for transfers, bed mobility, and ADLs. This nurse plus 2 other staff members have been into patients room for Brief changes 4 times before Noon. Patient has completely soiled his bed linen requiring 2 linen changes already.

## 2022-11-07 NOTE — Evaluation (Signed)
Occupational Therapy Evaluation Patient Details Name: Shane Chambers MRN: [id number] DOB: 1961/11/04 Today's Date: 11/07/2022   History of Present Illness Pt is a 61 y.o. male with medical history significant of anemia, coronary artery disease, COPD, depression, diabetes mellitus type 2, GERD, hyperlipidemia, hypertension, hypothyroidism, ischemic cardiomyopathy, peripheral vascular disease, and sleep apnea admitted to the hospital in [place] on the 12/18 after presenting from his living facility with complaints of suicidal thoughts. MD assessment includes: severe recurrent major depression, SOB, and acute on chronic combined systolic and diastolic CHF.  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   Clinical Impression   Chart reviewed, nurse cleared pt for participation in OT evaluation. Pt is alert, oriented x4, increased time for processing with poor awareness of current functional status. Pt endorses he just wants to go to [place] and die rather than return to SNF. Pt requires assist for ADL/IADL at baseline, is from LTC per chart. Pt presents with deficits in strength, endurance, activity tolerance, balance, all affecting safe and optimal ADL completion. MIN A required for bed mobility, TOTAL A for any attempts at standing. Pt reports use of hoyer lift at facility for functional transfers. At this time pt would continue to benfit from ongoing rehab to address physical deficits s/p BKA however endorses he does not want to return to SNF. Pt would require hospital bed, hoyer lift, mwc, maximal physical assist for ADLs if chooses an alternate discharge venue. OT will continue to follow acutely.      Recommendations for follow up therapy are one component of a multi-disciplinary discharge planning process, led by the attending physician.  Recommendations may be updated based on patient status, additional functional criteria and insurance authorization.   Follow Up Recommendations  Skilled nursing-short term  rehab (<3 hours/day) (pt reports he does not ever want to return to a SNF, if pt refuses SNF, would recommend discharge with OT in next venue of care)     Assistance Recommended at Discharge Frequent or constant Supervision/Assistance  Patient can return home with the following Two people to help with walking and/or transfers;Two people to help with bathing/dressing/bathroom;Assistance with cooking/housework;Direct supervision/assist for medications management;Assist for transportation;Help with stairs or ramp for entrance;Direct supervision/assist for financial management    Functional Status Assessment  Patient has had a recent decline in their functional status and demonstrates the ability to make significant improvements in function in a reasonable and predictable amount of time.  Equipment Recommendations  BSC/3in1;Tub/shower seat;Wheelchair (measurements OT);Wheelchair cushion (measurements OT);Hospital bed    Recommendations for Other Services       Precautions / Restrictions Precautions Precautions: Fall Restrictions Other Position/Activity Restrictions: no activity/WB orders in chart, ?wound along R BKA incision, treated as NWBing today      Mobility Bed Mobility Overal bed mobility: Needs Assistance Bed Mobility: Supine to Sit, Sit to Supine     Supine to sit: Min assist Sit to supine: Min assist        Transfers                          Balance Overall balance assessment: Needs assistance Sitting-balance support: Single extremity supported Sitting balance-Leahy Scale: Good       Standing balance-Leahy Scale: Zero                             ADL either performed or assessed with clinical judgement   ADL Overall ADL's :  Needs assistance/impaired                                       General ADL Comments: MIN A for bed mobility supine<>sit, TOTAL A to attempt standing, unable to clear buttocks; SETUP for grooming tasks  seated at edge of bed, MAX A for LLE LB dressing     Vision Patient Visual Report: No change from baseline       Perception     Praxis      Pertinent Vitals/Pain Pain Assessment Pain Assessment: Faces Faces Pain Scale: Hurts little more Pain Location: BLes Pain Descriptors / Indicators: Sore Pain Intervention(s): Limited activity within patient's tolerance, Monitored during session, Repositioned     Hand Dominance     Extremity/Trunk Assessment Upper Extremity Assessment Upper Extremity Assessment: Generalized weakness;[name] deficits/detail [name] Deficits / Details: grossly 4/5 throughout BUE, mild grip strength deficits [name] Coordination: decreased fine motor   Lower Extremity Assessment Lower Extremity Assessment: RLE deficits/detail RLE Deficits / Details: s/p R BKA       Communication Communication Communication: No difficulties   Cognition Arousal/Alertness: Awake/alert Behavior During Therapy: WFL for tasks assessed/performed Overall Cognitive Status: No family/caregiver present to determine baseline cognitive functioning Area of Impairment: Attention, Memory, Following commands, Safety/judgement, Awareness, Problem solving                   Current Attention Level: Selective Memory: Decreased recall of precautions Following Commands: Follows one step commands with increased time Safety/Judgement: Decreased awareness of safety, Decreased awareness of deficits Awareness: Intellectual Problem Solving: Requires verbal cues, Requires tactile cues General Comments: pt reporting "I just want to go to [place] and die rather than going to a SNF"     General Comments  noted small opening on R BKA incision site, redness directly above L ankle (partially wrapped); RNs notified    Exercises Other Exercises Other Exercises: edu re: role of OT, role of rehab, discharge recommendations, participation in ADLs   Shoulder Instructions      Home Living Family/patient  expects to be discharged to:: Skilled nursing facility Living Arrangements: Other (Comment) [facility])                               Additional Comments: pt lives at [facility] facility in [place], endorses he never wants to return to a SNF      Prior Functioning/Environment Prior Level of Function : Needs assist             Mobility Comments: use of patient care lift for transfers, reports unable to stand since recent BKA ADLs Comments: assist for all ADL/IADL        OT Problem List: Decreased activity tolerance;Impaired balance (sitting and/or standing);Decreased cognition;Decreased safety awareness;Decreased knowledge of use of DME or AE;Decreased knowledge of precautions;Pain;Decreased strength      OT Treatment/Interventions: Self-care/ADL training;Therapeutic activities;DME and/or AE instruction;Patient/family education    OT Goals(Current goals can be found in the care plan section) Acute Rehab OT Goals Patient Stated Goal: never return to SNF OT Goal Formulation: With patient Time For Goal Achievement: 11/21/22 Potential to Achieve Goals: Fair ADL Goals Pt Will Perform Grooming: with supervision;sitting Pt Will Perform Lower Body Dressing: with min assist Pt Will Transfer to Toilet: with mod assist;bedside commode Pt Will Perform Toileting - Clothing Manipulation and hygiene: with mod assist;sitting/lateral  leans  OT Frequency: Min 2X/week    Co-evaluation              AM-PAC OT "6 Clicks" Daily Activity     Outcome Measure Help from another person eating meals?: None Help from another person taking care of personal grooming?: A Little Help from another person toileting, which includes using toliet, bedpan, or urinal?: Total Help from another person bathing (including washing, rinsing, drying)?: A Lot Help from another person to put on and taking off regular upper body clothing?: A Lot Help from another person to put on and taking off  regular lower body clothing?: A Lot 6 Click Score: 14   End of Session Nurse Communication: Mobility status (wounds)  Activity Tolerance: Patient limited by fatigue Patient left: in bed (with all needs met)  OT Visit Diagnosis: Unsteadiness on feet (R26.81);Muscle weakness (generalized) (M62.81)                Time: 5183-3582 OT Time Calculation (min): 16 min Charges:  OT General Charges $OT Visit: 1 Visit OT Evaluation $OT Eval Moderate Complexity: 1 Mod  Shanon Payor, OTD OTR/L  11/07/22, 4:19 PM

## 2022-11-07 NOTE — BH IP Treatment Plan (Signed)
Interdisciplinary Treatment and Diagnostic Plan Update  11/07/2022 Time of Session: 11:00 Shane Chambers MRN: [id number]  Principal Diagnosis: Severe recurrent major depression without psychotic features [facility])  Secondary Diagnoses: Principal Problem:   Severe recurrent major depression without psychotic features ([place]) Active Problems:   SOB (shortness of breath)   Hypertension   Type 2 diabetes mellitus ([place])   Normocytic anemia   Acute on chronic combined systolic and diastolic CHF (congestive heart failure) (H[facility])   Ischemic cardiomyopathy   Rectal cancer ([place])   Hypothyroidism   Current Medications:  Current Facility-Administered Medications  Medication Dose Route Frequency Provider Last Rate Last Admin   acetaminophen (TYLENOL) tablet 650 mg  650 mg Oral Q6H PRN Clapacs, John T, MD   650 mg at 11/06/22 1941   albuterol (VENTOLIN HFA) 108 (90 Base) MCG/ACT inhaler 2 puff  2 puff Inhalation Q6H PRN Clapacs, John T, MD   2 puff at 11/06/22 1602   alum & mag hydroxide-simeth (MAALOX/MYLANTA) 200-200-20 MG/5ML suspension 30 mL  30 mL Oral Q4H PRN Clapacs, John T, MD       amLODipine (NORVASC) tablet 5 mg  5 mg Oral Daily Clapacs, John T, MD   5 mg at 11/07/22 1029   ARIPiprazole (ABILIFY) tablet 5 mg  5 mg Oral Daily Clapacs, John T, MD   5 mg at 11/07/22 1029   aspirin EC tablet 81 mg  81 mg Oral Daily Clapacs, John T, MD   81 mg at 11/07/22 1028   atorvastatin (LIPITOR) tablet 80 mg  80 mg Oral QHS Clapacs, John T, MD   80 mg at 11/06/22 2120   docusate sodium (COLACE) capsule 100 mg  100 mg Oral BID Clapacs, John T, MD   100 mg at 11/07/22 1030   ferrous gluconate (FERGON) tablet 324 mg  324 mg Oral Q breakfast Clapacs, John T, MD   324 mg at 11/07/22 1029   fluticasone furoate-vilanterol (BREO ELLIPTA) 100-25 MCG/ACT 1 puff  1 puff Inhalation Daily Clapacs, Madie Reno, MD   1 puff at 11/07/22 1031   Gerhardt's butt cream   Topical TID Clapacs, Madie Reno, MD       levothyroxine  (SYNTHROID) tablet 75 mcg  75 mcg Oral Q0600 Clapacs, John T, MD   75 mcg at 11/07/22 0620   magnesium hydroxide (MILK OF MAGNESIA) suspension 30 mL  30 mL Oral Daily PRN Clapacs, John T, MD       magnesium oxide (MAG-OX) tablet 400 mg  400 mg Oral Daily Clapacs, John T, MD   400 mg at 11/07/22 1030   metFORMIN (GLUCOPHAGE) tablet 1,000 mg  1,000 mg Oral BID WC Clapacs, John T, MD   1,000 mg at 11/07/22 1028   metoprolol tartrate (LOPRESSOR) tablet 50 mg  50 mg Oral BID Clapacs, John T, MD   50 mg at 11/07/22 1029   pantoprazole (PROTONIX) EC tablet 40 mg  40 mg Oral Daily Clapacs, John T, MD   40 mg at 11/07/22 1029   polyethylene glycol (MIRALAX / GLYCOLAX) packet 17 g  17 g Oral Daily Clapacs, John T, MD   17 g at 11/06/22 0917   potassium chloride SA (KLOR-CON M) CR tablet 20 mEq  20 mEq Oral Daily Clapacs, John T, MD   20 mEq at 11/07/22 1030   senna-docusate (Senokot-S) tablet 1 tablet  1 tablet Oral QHS PRN Clapacs, John T, MD       tamsulosin (FLOMAX) capsule 0.4 mg  0.4 mg  Oral QPC breakfast Clapacs, Madie Reno, MD   0.4 mg at 11/07/22 1029   thiamine (VITAMIN B1) tablet 100 mg  100 mg Oral Daily Clapacs, John T, MD   100 mg at 11/07/22 1030   torsemide (DEMADEX) tablet 20 mg  20 mg Oral Daily Clapacs, John T, MD   20 mg at 11/07/22 1030   venlafaxine XR (EFFEXOR-XR) 24 hr capsule 75 mg  75 mg Oral Q breakfast Clapacs, John T, MD   75 mg at 11/07/22 1029   PTA Medications: Medications Prior to Admission  Medication Sig Dispense Refill Last Dose   acetaminophen (TYLENOL) 500 MG tablet Take 1,000 mg by mouth every 8 (eight) hours as needed for fever or moderate pain.      albuterol (VENTOLIN HFA) 108 (90 Base) MCG/ACT inhaler Inhale 2 puffs into the lungs every 6 (six) hours as needed for wheezing or shortness of breath.      alum & mag hydroxide-simeth (MAALOX/MYLANTA) 200-200-20 MG/5ML suspension Take 30 mLs by mouth every 6 (six) hours as needed for indigestion or heartburn. 355 mL 0     Amino Acids-Protein Hydrolys (PRO-STAT AWC PO) Take 30 mLs by mouth 2 (two) times daily.      amLODipine (NORVASC) 10 MG tablet Take 1 tablet (10 mg total) by mouth daily.      aspirin 81 MG EC tablet Take 1 tablet (81 mg total) by mouth daily with breakfast. 30 tablet 12    atorvastatin (LIPITOR) 80 MG tablet Take 80 mg by mouth at bedtime.      budesonide-formoterol (SYMBICORT) 160-4.5 MCG/ACT inhaler Inhale 2 puffs into the lungs in the morning and at bedtime. 1 Inhaler 6    Cholecalciferol 125 MCG (5000 UT) TABS Take 1 tablet by mouth once a week. On Mondays      clotrimazole-betamethasone (LOTRISONE) cream Apply 1 Application topically 2 (two) times daily.      docusate sodium (COLACE) 100 MG capsule Take 100 mg by mouth daily.      feeding supplement (ENSURE ENLIVE / ENSURE PLUS) LIQD Take 237 mLs by mouth 3 (three) times daily between meals. 237 mL 12    FERROUS GLUCONATE PO Take 324 mg by mouth daily.      insulin aspart (NOVOLOG FLEXPEN) 100 UNIT/ML FlexPen 0-15 Units, Subcutaneous, 3 times daily with meals CBG < 70: implement hypoglycemia protocol-call MD CBG 70 - 120: 0 units CBG 121 - 150: 2 units CBG 151 - 200: 3 units CBG 201 - 250: 5 units CBG 251 - 300: 8 units CBG 301 - 350: 11 units CBG 351 - 400: 15 units CBG > 400: 15 mL 0    ipratropium-albuterol (DUONEB) 0.5-2.5 (3) MG/3ML SOLN Take 3 mLs by nebulization every 6 (six) hours as needed. 360 mL     levothyroxine (SYNTHROID) 75 MCG tablet Take 75 mcg by mouth daily before breakfast.      loperamide HCl (IMODIUM) 2 MG/15ML solution Take 4 mg by mouth every 6 (six) hours as needed for diarrhea or loose stools.      magnesium oxide (MAG-OX) 400 MG tablet Take 400 mg by mouth daily.      metFORMIN (GLUCOPHAGE) 1000 MG tablet Take 1,000 mg by mouth 2 (two) times daily.      metoprolol tartrate (LOPRESSOR) 100 MG tablet Take 1 tablet (100 mg total) by mouth 2 (two) times daily.      Multiple Vitamin (MULTIVITAMIN) tablet Take 1  tablet by mouth daily.  omeprazole (PRILOSEC) 40 MG capsule Take 40 mg by mouth daily.      polyethylene glycol (MIRALAX / GLYCOLAX) 17 g packet Take 17 g by mouth daily as needed for moderate constipation.      polyvinyl alcohol (LIQUIFILM TEARS) 1.4 % ophthalmic solution Place 1 drop into both eyes daily.      potassium chloride SA (KLOR-CON M) 20 MEQ tablet Take 1 tablet (20 mEq total) by mouth daily. 90 tablet 1    senna-docusate (SENOKOT-S) 8.6-50 MG tablet Take 1 tablet by mouth 2 (two) times daily.      tamsulosin (FLOMAX) 0.4 MG CAPS capsule Take 0.8 mg by mouth daily.      thiamine (VITAMIN B-1) 100 MG tablet Take 1 tablet (100 mg total) by mouth daily.      torsemide (DEMADEX) 20 MG tablet Take 2 tablets (40 mg total) by mouth daily. 90 tablet 6     Patient Stressors:    Patient Strengths: Marketing executive fund of knowledge   Treatment Modalities: Medication Management, Group therapy, Case management,  1 to 1 session with clinician, Psychoeducation, Recreational therapy.   Physician Treatment Plan for Primary Diagnosis: Severe recurrent major depression without psychotic features ([place]) Long Term Goal(s): Improvement in symptoms so as ready for discharge   Short Term Goals: Ability to identify and develop effective coping behaviors will improve Ability to maintain clinical measurements within normal limits will improve Ability to verbalize feelings will improve Ability to disclose and discuss suicidal ideas Ability to demonstrate self-control will improve  Medication Management: Evaluate patient's response, side effects, and tolerance of medication regimen.  Therapeutic Interventions: 1 to 1 sessions, Unit Group sessions and Medication administration.  Evaluation of Outcomes: Not Progressing  Physician Treatment Plan for Secondary Diagnosis: Principal Problem:   Severe recurrent major depression without psychotic features ([place]) Active Problems:   SOB  (shortness of breath)   Hypertension   Type 2 diabetes mellitus ([facility])   Normocytic anemia   Acute on chronic combined systolic and diastolic CHF (congestive heart failure) ([facility])   Ischemic cardiomyopathy   Rectal cancer ([facility])   Hypothyroidism  Long Term Goal(s): Improvement in symptoms so as ready for discharge   Short Term Goals: Ability to identify and develop effective coping behaviors will improve Ability to maintain clinical measurements within normal limits will improve Ability to verbalize feelings will improve Ability to disclose and discuss suicidal ideas Ability to demonstrate self-control will improve     Medication Management: Evaluate patient's response, side effects, and tolerance of medication regimen.  Therapeutic Interventions: 1 to 1 sessions, Unit Group sessions and Medication administration.  Evaluation of Outcomes: Not Progressing   RN Treatment Plan for Primary Diagnosis: Severe recurrent major depression without psychotic features ([place]) Long Term Goal(s): Knowledge of disease and therapeutic regimen to maintain health will improve  Short Term Goals: Ability to demonstrate self-control, Ability to participate in decision making will improve, Ability to verbalize feelings will improve, Ability to disclose and discuss suicidal ideas, Ability to identify and develop effective coping behaviors will improve, and Compliance with prescribed medications will improve  Medication Management: RN will administer medications as ordered by provider, will assess and evaluate patient's response and provide education to patient for prescribed medication. RN will report any adverse and/or side effects to prescribing provider.  Therapeutic Interventions: 1 on 1 counseling sessions, Psychoeducation, Medication administration, Evaluate responses to treatment, Monitor vital signs and CBGs as ordered, Perform/monitor CIWA, COWS, AIMS and Fall Risk screenings as ordered,  Perform wound  care treatments as ordered.  Evaluation of Outcomes: Not Progressing   LCSW Treatment Plan for Primary Diagnosis: Severe recurrent major depression without psychotic features ([place]) Long Term Goal(s): Safe transition to appropriate next level of care at discharge, Engage patient in therapeutic group addressing interpersonal concerns.  Short Term Goals: Engage patient in aftercare planning with referrals and resources, Increase social support, Increase ability to appropriately verbalize feelings, Increase emotional regulation, Facilitate acceptance of mental health diagnosis and concerns, and Increase skills for wellness and recovery  Therapeutic Interventions: Assess for all discharge needs, 1 to 1 time with Social worker, Explore available resources and support systems, Assess for adequacy in community support network, Educate family and significant other(s) on suicide prevention, Complete Psychosocial Assessment, Interpersonal group therapy.  Evaluation of Outcomes: Not Progressing   Progress in Treatment: Attending groups: No. Participating in groups: No. Taking medication as prescribed: Yes. Toleration medication: Yes. Family/Significant other contact made: No, will contact:  once permission is given. Patient understands diagnosis: Yes. Discussing patient identified problems/goals with staff: Yes. Medical problems stabilized or resolved: No. Denies suicidal/homicidal ideation: Yes. Issues/concerns per patient self-inventory: No. Other: none  New problem(s) identified: Yes, Describe:  Patient has wounds on bottom and leg that need to be monitored.   New Short Term/Long Term Goal(s): medication management for mood stabilization; elimination of SI thoughts; development of comprehensive mental wellness plan.   Patient Goals:  "get some of my strength back and getting out on my own again"  Discharge Plan or Barriers: Patient reports that he does not wish to return to [facility], stating that the facility makes him feel suicidal.  Patient is a 3-person lift and will likely need another SNF placement. CSW to assist in finding placement for patient.   Reason for Continuation of Hospitalization: Anxiety Depression Medical Issues Medication stabilization Suicidal ideation  Estimated Length of Stay:  1-7 days  Last 3 [place] Suicide Severity Risk Score: Welcome Admission (Current) from 11/05/2022 in [place] ED from 10/27/2022 in [place] ED to Hosp-Admission (Discharged) from 10/02/2022 in [facility]  C-SSRS RISK CATEGORY No Risk High Risk No Risk       Last PHQ 2/9 Scores:     No data to display          Scribe for Treatment Team: Rozann Lesches, LCSW 11/07/2022 11:14 AM

## 2022-11-07 NOTE — Progress Notes (Signed)
Nurse manager reached out to Dellie Burns from Risk Management.  Staff is concerned about the risk of infection and further skin breakdown.

## 2022-11-07 NOTE — Plan of Care (Signed)
  Problem: Nutrition: Goal: Adequate nutrition will be maintained Outcome: Progressing   Problem: Safety: Goal: Ability to remain free from injury will improve Outcome: Progressing   Problem: Coping: Goal: Ability to demonstrate self-control will improve Outcome: Progressing   Problem: Education: Goal: Knowledge of the prescribed therapeutic regimen will improve Outcome: Progressing   Problem: Coping: Goal: Will verbalize feelings Outcome: Progressing   Problem: Safety: Goal: Ability to disclose and discuss suicidal ideas will improve Outcome: Progressing   Problem: Activity: Goal: Risk for activity intolerance will decrease Outcome: Not Progressing   Problem: Coping: Goal: Level of anxiety will decrease Outcome: Not Progressing   Problem: Elimination: Goal: Will not experience complications related to bowel motility Outcome: Not Progressing   Problem: Pain Managment: Goal: General experience of comfort will improve Outcome: Not Progressing   Problem: Education: Goal: Emotional status will improve Outcome: Not Progressing Goal: Mental status will improve Outcome: Not Progressing   Problem: Activity: Goal: Interest or engagement in activities will improve Outcome: Not Progressing   Problem: Activity: Goal: Interest or engagement in leisure activities will improve Outcome: Not Progressing   Problem: Coping: Goal: Coping ability will improve Outcome: Not Progressing

## 2022-11-07 NOTE — Consult Note (Addendum)
[facility] Nurse Consult Note: Reason for Consult: Consult requested for bilat buttocks.  Performed remotely after review of the progress notes and the nursing wound care flow sheet.  Pt has been incontinent and has red moist macerated skin and Stage 2 pressure injuries in patchy areas to bilat buttocks.  These can be treated independently by the bedside nurses using the skin care order set in Epic, but I will add Gerhardt's cream as an order for them to apply.  Pressure Injury POA: Yes Dressing procedure/placement/frequency: Topical treatment orders provided for bedside nurses to perform as follows to promote healing and repel moisture and decrease discomfort: Apply Gerhardts butt cream TID and PRN when turning and cleaning., leave dressings off since they are becoming soiled.   POST NOTE 2:00:  Refer to previous consult above. Now requested by bedside nurse via Secure Chat to assess bilat leg wounds. I am currently working on another campus and there is not a Corporate treasurer member available to perform a wound assessment at that location. Secure chat message sent to request photos of the wounds if possible.  Also sent the following note in Secure chat: "I will put in orders for xeroform gauze to BLE until someone from our team is available at [facility] to assess the wounds in person if you are unable to obtain photos.  Thank-you" Thank-you,  Julien Girt MSN, RN, [place], [place], [place]

## 2022-11-07 NOTE — [facility] Group Notes (Signed)
[facility] Notes:  (Nursing/MHT/Case Management/Adjunct)  Date:  11/07/2022  Time:  2:51 PM  Type of Therapy:   community meeting  Participation Level:  Did Not Attend    Shane Chambers 11/07/2022, 2:51 PM

## 2022-11-07 NOTE — Progress Notes (Signed)
Wound care nurse messaged via secure chat.  RN had questions about a bandage for the patient's bottom and wounds on bilateral legs.

## 2022-11-07 NOTE — Progress Notes (Signed)
Patient given urinal to use.  Patient's brief changed.  Patient sitting on the side of the bed. Call bell in reach.
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
## 2022-11-07 NOTE — Progress Notes (Signed)
[facility] MD Progress Note  11/07/2022 3:08 PM Shane Chambers  MRN:  [id number] Subjective: Follow-up 60 year old man with recurrent depression and multiple medical problems.  Patient continues to be complaining of depression.  No acute suicidal intent.  No psychosis.  Major focus of treatment remains his medical issues.  Nursing reports that he is having frequent loose bowel movements which complicated any attempts to put a dressing on his lower back.  At this time we are leaving the lower back without a dressing and are talking with the wound consult team.  Patient is unable to stand up and get out of bed on his own.  Blood sugars are staying within the mid 100s.  Blood pressure stable. Principal Problem: Severe recurrent major depression without psychotic features ([place]) Diagnosis: Principal Problem:   Severe recurrent major depression without psychotic features ([place]) Active Problems:   SOB (shortness of breath)   Hypertension   Type 2 diabetes mellitus (HCC)   Normocytic anemia   Acute on chronic combined systolic and diastolic CHF (congestive heart failure) (HCC)   Ischemic cardiomyopathy   Rectal cancer (HCC)   Hypothyroidism  Total Time spent with patient: 30 minutes  Past Psychiatric History: Past history of recurrent depression which she describes as lifelong.  Past Medical History:  Past Medical History:  Diagnosis Date   Anemia    Arthritis    CAD (coronary artery disease)    a. s/p CABG in 03/2020 with LIMA-LAD, RIMA-PL, RA-D1-RI-OM1   Cancer ([place])    rectal   Cellulitis    COPD (chronic obstructive pulmonary disease) ([place])    Depression    Diabetes mellitus without complication (HCC)    GERD (gastroesophageal reflux disease)    History of kidney stones    Hyperlipidemia 12/01/2019   Hypertension    Hypothyroidism    Ischemic cardiomyopathy    a. EF 20-25% by echo in 02/2020 b. at 40% by echo in 03/2020 c. EF normalized to 60-65% by echo in 04/2020   Myocardial  infarction [facility])    Peripheral vascular disease ([place])    Sleep apnea    Type 2 diabetes mellitus ([place])     Past Surgical History:  Procedure Laterality Date   AMPUTATION Right 10/05/2022   Procedure: AMPUTATION BELOW KNEE;  Surgeon: Newt Minion, MD;  Location: [place];  Service: Orthopedics;  Laterality: Right;   APPLICATION OF WOUND VAC Right 10/05/2022   Procedure: APPLICATION OF WOUND VAC;  Surgeon: Newt Minion, MD;  Location: [place];  Service: Orthopedics;  Laterality: Right;   BIOPSY  07/18/2021   Procedure: BIOPSY;  Surgeon: Eloise Harman, DO;  [facility];  Service: Endoscopy;;   BIOPSY  01/09/2022   Procedure: BIOPSY;  Surgeon: Eloise Harman, DO;  [facility];  Service: Endoscopy;;   COLONOSCOPY WITH PROPOFOL N/A 07/18/2021   Carver: 15 millimeter polyp removed from the sigmoid colon, 5 mm polyp removed from sigmoid colon.  Nonbleeding internal hemorrhoids.  Significant looping of the colon. sigmoid path showed invasive colonic adenocarcinoma involving tubular adenoma (invades to depth of 58m, carcinoma 116mfrom margin, no lymphovascular invasion, no poorly differentiated component.   CORONARY ARTERY BYPASS GRAFT N/A 03/13/2020   Procedure: CORONARY ARTERY BYPASS GRAFTING (CABG) times five using bilateral Internal mammary arteries and left radial artery;  Surgeon: AtWonda OldsMD;  Location: [place] Service: Open Heart Surgery;  Laterality: N/A;   DENTAL SURGERY     ESOPHAGOGASTRODUODENOSCOPY (EGD) WITH PROPOFOL  N/A 07/18/2021   Carver: 1 gastric polyp status post biopsy, gastritis. gastric bx with slight chronic inflammation and no H.pyori. GEJ polypectomy with mild inflammation only   FLEXIBLE SIGMOIDOSCOPY N/A 08/26/2021   Carver: Nonbleeding internal hemorrhoids.  15 mm ulcers from previous polypectomy found in the rectum.  No evidence of previous polyp.  Located 5 to 8 cm from anal verge.   FLEXIBLE SIGMOIDOSCOPY N/A 01/09/2022   Procedure:  FLEXIBLE SIGMOIDOSCOPY;  Surgeon: Eloise Harman, DO;  [facility];  Service: Endoscopy;  Laterality: N/A;   POLYPECTOMY  07/18/2021   Procedure: POLYPECTOMY INTESTINAL;  Surgeon: Eloise Harman, DO;  [facility];  Service: Endoscopy;;   RADIAL ARTERY HARVEST Left 03/13/2020   Procedure: [place],;  Surgeon: [name], MD;  Location: [place];  Service: Open Heart Surgery;  Laterality: Left;   RIGHT/LEFT HEART CATH AND CORONARY ANGIOGRAPHY N/A 03/07/2020   Procedure: RIGHT/LEFT HEART CATH AND CORONARY ANGIOGRAPHY;  Surgeon: Sherren Mocha, MD;  Location: [place] [facility];  Service: Cardiovascular;  Laterality: N/A;   SUBMUCOSAL LIFTING INJECTION  01/09/2022   Procedure: SUBMUCOSAL LIFTING INJECTION;  Surgeon: Eloise Harman, DO;  [facility];  Service: Endoscopy;;   SUBMUCOSAL TATTOO INJECTION  01/09/2022   Procedure: SUBMUCOSAL TATTOO INJECTION;  Surgeon: Eloise Harman, DO;  [facility];  Service: Endoscopy;;   TEE WITHOUT CARDIOVERSION N/A 03/13/2020   Procedure: TRANSESOPHAGEAL ECHOCARDIOGRAM (TEE);  Surgeon: [name], MD;  Location: [place];  Service: Open Heart Surgery;  Laterality: N/A;   Family History:  Family History  Problem Relation Age of Onset   Hypertension Mother    Family Psychiatric  History: See previous Social History:  Social History   Substance and Sexual Activity  Alcohol Use Not Currently   Comment: rarely     Social History   Substance and Sexual Activity  Drug Use No    Social History   Socioeconomic History   Marital status: Single    Spouse name: Not on file   Number of children: Not on file   Years of education: Not on file   Highest education level: Not on file  Occupational History   Not on file  Tobacco Use   Smoking status: Former    Packs/day: 1.50    Types: Cigarettes    Quit date: 05/25/1995    Years since quitting: 27.4   Smokeless tobacco: Never   Vaping Use   Vaping Use: Never used  Substance and Sexual Activity   Alcohol use: Not Currently    Comment: rarely   Drug use: No   Sexual activity: Not on file  Other Topics Concern   Not on file  Social History Narrative   Not on file   Social Determinants of Health   Financial Resource Strain: Not on file  Food Insecurity: No Food Insecurity (11/05/2022)   Hunger Vital Sign    Worried About Running Out of Food in the Last Year: Never true    Ran Out of Food in the Last Year: Never true  Transportation Needs: No Transportation Needs (11/05/2022)   PRAPARE - Hydrologist (Medical): No    Lack of Transportation (Non-Medical): No  Physical Activity: Not on file  Stress: Not on file  Social Connections: Not on file   Additional Social History:  Sleep: Fair  Appetite:  Fair  Current Medications: Current Facility-Administered Medications  Medication Dose Route Frequency Provider Last Rate Last Admin   acetaminophen (TYLENOL) tablet 650 mg  650 mg Oral Q6H PRN Coy Vandoren T, MD   650 mg at 11/06/22 1941   albuterol (VENTOLIN HFA) 108 (90 Base) MCG/ACT inhaler 2 puff  2 puff Inhalation Q6H PRN Mae Cianci,  T, MD   2 puff at 11/06/22 1602   alum & mag hydroxide-simeth (MAALOX/MYLANTA) 200-200-20 MG/5ML suspension 30 mL  30 mL Oral Q4H PRN Jaslyn Bansal T, MD       amLODipine (NORVASC) tablet 5 mg  5 mg Oral Daily Loie Jahr T, MD   5 mg at 11/07/22 1029   ARIPiprazole (ABILIFY) tablet 5 mg  5 mg Oral Daily Deven Audi T, MD   5 mg at 11/07/22 1029   aspirin EC tablet 81 mg  81 mg Oral Daily Dewie Ahart,  T, MD   81 mg at 11/07/22 1028   atorvastatin (LIPITOR) tablet 80 mg  80 mg Oral QHS Meeya Goldin T, MD   80 mg at 11/06/22 2120   docusate sodium (COLACE) capsule 100 mg  100 mg Oral BID Yanuel Tagg T, MD   100 mg at 11/07/22 1030   ferrous gluconate (FERGON) tablet 324 mg  324 mg Oral Q breakfast  Kahleah Crass T, MD   324 mg at 11/07/22 1029   fluticasone furoate-vilanterol (BREO ELLIPTA) 100-25 MCG/ACT 1 puff  1 puff Inhalation Daily Shye Doty, Madie Reno, MD   1 puff at 11/07/22 1031   Gerhardt's butt cream   Topical TID Jaeli Grubb, Madie Reno, MD   1 Application at 92/33/00 1424   levothyroxine (SYNTHROID) tablet 75 mcg  75 mcg Oral Q0600 Alya Smaltz T, MD   75 mcg at 11/07/22 0620   magnesium hydroxide (MILK OF MAGNESIA) suspension 30 mL  30 mL Oral Daily PRN Scotti Motter T, MD       magnesium oxide (MAG-OX) tablet 400 mg  400 mg Oral Daily Ramzy Cappelletti T, MD   400 mg at 11/07/22 1030   metFORMIN (GLUCOPHAGE) tablet 1,000 mg  1,000 mg Oral BID WC Onda Kattner T, MD   1,000 mg at 11/07/22 1028   metoprolol tartrate (LOPRESSOR) tablet 50 mg  50 mg Oral BID Murray Durrell T, MD   50 mg at 11/07/22 1029   pantoprazole (PROTONIX) EC tablet 40 mg  40 mg Oral Daily Dakarri Kessinger T, MD   40 mg at 11/07/22 1029   polyethylene glycol (MIRALAX / GLYCOLAX) packet 17 g  17 g Oral Daily Anne Boltz T, MD   17 g at 11/06/22 7622   potassium chloride SA (KLOR-CON M) CR tablet 20 mEq  20 mEq Oral Daily Makara Lanzo T, MD   20 mEq at 11/07/22 1030   senna-docusate (Senokot-S) tablet 1 tablet  1 tablet Oral QHS PRN , Madie Reno, MD       tamsulosin (FLOMAX) capsule 0.4 mg  0.4 mg Oral QPC breakfast Norelle Runnion T, MD   0.4 mg at 11/07/22 1029   thiamine (VITAMIN B1) tablet 100 mg  100 mg Oral Daily Kincade Granberg T, MD   100 mg at 11/07/22 1030   torsemide (DEMADEX) tablet 20 mg  20 mg Oral Daily Izaac Reisig T, MD   20 mg at 11/07/22 1030   venlafaxine XR (EFFEXOR-XR) 24 hr capsule 75 mg  75 mg Oral Q breakfast , Madie Reno, MD   75  mg at 11/07/22 1029    Lab Results:  Results for orders placed or performed during the hospital encounter of 11/05/22 (from the past 48 hour(s))  Glucose, capillary     Status: Abnormal   Collection Time: 11/05/22 10:04 PM  Result Value Ref Range   Glucose-Capillary 146  (H) 70 - 99 mg/dL    Comment: Glucose reference range applies only to samples taken after fasting for at least 8 hours.  Glucose, capillary     Status: Abnormal   Collection Time: 11/06/22  4:04 PM  Result Value Ref Range   Glucose-Capillary 154 (H) 70 - 99 mg/dL    Comment: Glucose reference range applies only to samples taken after fasting for at least 8 hours.  Lipid panel     Status: Abnormal   Collection Time: 11/07/22  7:23 AM  Result Value Ref Range   Cholesterol 147 0 - 200 mg/dL   Triglycerides 159 (H) <150 mg/dL   HDL 35 (L) >40 mg/dL   Total CHOL/HDL Ratio 4.2 RATIO   VLDL 32 0 - 40 mg/dL   LDL Cholesterol 80 0 - 99 mg/dL    Comment:        Total Cholesterol/HDL:CHD Risk Coronary Heart Disease Risk Table                     Men   Women  1/2 Average Risk   3.4   3.3  Average Risk       5.0   4.4  2 X Average Risk   9.6   7.1  3 X Average Risk  23.4   11.0        Use the calculated Patient Ratio above and the CHD Risk Table to determine the patient's CHD Risk.        ATP III CLASSIFICATION (LDL):  <100     mg/dL   Optimal  100-129  mg/dL   Near or Above                    Optimal  130-159  mg/dL   Borderline  160-189  mg/dL   High  >190     mg/dL   Very High Performed at [facility], [address] [place],  [address]     Blood Alcohol level:  Lab Results  Component Value Date   [facility] Dt <10 23/53/6144    Metabolic Disorder Labs: Lab Results  Component Value Date   HGBA1C 5.6 09/26/2022   MPG 114.02 09/26/2022   MPG 271.87 11/23/2020   No results found for: "PROLACTIN" Lab Results  Component Value Date   CHOL 147 11/07/2022   TRIG 159 (H) 11/07/2022   HDL 35 (L) 11/07/2022   CHOLHDL 4.2 11/07/2022   VLDL 32 11/07/2022   LDLCALC 80 11/07/2022   LDLCALC 82 08/02/2020    Physical Findings: AIMS:  , ,  ,  ,    CIWA:    COWS:     Musculoskeletal: Strength & Muscle Tone: decreased Gait & Station: unable to stand Patient leans:  N/A  Psychiatric Specialty Exam:  Presentation  General Appearance:  Casual  Eye Contact: Good  Speech: Clear and Coherent  Speech Volume: Normal  Handedness: Right   Mood and Affect  Mood: Dysphoric  Affect: Congruent; Appropriate   Thought Process  Thought Processes: Coherent  Descriptions of Associations:Intact  Orientation:Full (Time, Place and Person)  Thought Content:Logical; Perseveration  History of Schizophrenia/Schizoaffective disorder:No  Duration of Psychotic  Symptoms:N/A  Hallucinations:No data recorded Ideas of Reference:None  Suicidal Thoughts:No data recorded Homicidal Thoughts:No data recorded  Sensorium  Memory: Immediate Good; Recent Good  Judgment: Fair  Insight: Shallow   Executive Functions  Concentration: Fair  Attention Span: Fair  Recall: [place] of Knowledge: Fair  Language: Fair   Psychomotor Activity  Psychomotor Activity:No data recorded  Assets  Assets: Resilience; Financial Resources/Insurance; Communication Skills   Sleep  Sleep:No data recorded   Physical Exam: Physical Exam Vitals and nursing note reviewed.  Constitutional:      Appearance: Normal appearance.  HENT:     Head: Normocephalic and atraumatic.     Mouth/Throat:     Pharynx: Oropharynx is clear.  Eyes:     Pupils: Pupils are equal, round, and reactive to light.  Cardiovascular:     Rate and Rhythm: Normal rate and regular rhythm.  Pulmonary:     Effort: Pulmonary effort is normal.     Breath sounds: Normal breath sounds.  Abdominal:     General: Abdomen is flat.     Palpations: Abdomen is soft.  Musculoskeletal:        General: Normal range of motion.  Skin:    General: Skin is warm and dry.  Neurological:     General: No focal deficit present.     Mental Status: He is alert. Mental status is at baseline.  Psychiatric:        Mood and Affect: Mood normal.        Thought Content: Thought content normal.     Review of Systems  Constitutional: Negative.   HENT: Negative.    Eyes: Negative.   Respiratory: Negative.    Cardiovascular: Negative.   Gastrointestinal: Negative.   Musculoskeletal: Negative.   Skin: Negative.   Neurological: Negative.   Psychiatric/Behavioral:  Positive for depression. The patient is nervous/anxious.    Blood pressure (!) 140/57, pulse 70, temperature 99 F (37.2 C), temperature source Oral, resp. rate 18, height '5\' 9"'$  (1.753 m), weight 107 kg, SpO2 96 %. Body mass index is 34.85 kg/m.   Treatment Plan Summary: Medication management and Plan mood symptoms appear to probably be pretty stable at baseline.  Chronically dysthymic a lot of which is related to his medical issues.  Physical therapy is going to see him.  Wound care has commented and supported the decision to just use barrier ointment on the red area on his back.  Nursing reports that it takes multiple staff members and quite a bit of time and effort to move him out of bed into a chair which is limiting their ability to include him in groups and other therapeutic activities on the unit.  At this point the focus is largely going to be on discharge planning and placement.  Alethia Berthold, MD 11/07/2022, 3:08 PM

## 2022-11-07 NOTE — Plan of Care (Signed)
  Problem: Education: Goal: Knowledge of General Education information will improve Description: Including pain rating scale, medication(s)/side effects and non-pharmacologic comfort measures Outcome: Progressing   Problem: Nutrition: Goal: Adequate nutrition will be maintained Outcome: Progressing   Problem: Clinical Measurements: Goal: Will remain free from infection Outcome: Not Progressing   Problem: Activity: Goal: Risk for activity intolerance will decrease Outcome: Not Progressing   Problem: Coping: Goal: Level of anxiety will decrease Outcome: Not Progressing   Problem: Elimination: Goal: Will not experience complications related to bowel motility Outcome: Not Progressing

## 2022-11-07 NOTE — Group Note (Signed)
LCSW Group Therapy Note   Group Date: 11/07/2022 Start Time: 4388 End Time: 1515  Type of Therapy and Topic:  Group Therapy:  Music and Mood   Participation Level:  [facility] PARTICIPATION LEVEL: Did Not Attend   Description of Group:   In this process group, members listened to a variety of genres of music and identified that different types of music evoke different responses.  Patients were encouraged to identify music that was soothing for them and music that was energizing for them.  Patients discussed how this knowledge can help with wellness and recovery in various ways including managing depression and anxiety as well as encouraging healthy sleep habits.     Therapeutic Goals: Patients will explore the impact of different varieties of music on mood Patients will verbalize the thoughts they have when listening to different types of music Patients will identify music that is soothing to them as well as music that is energizing to them Patients will discuss how to use this knowledge to assist in maintaining wellness and recovery Patients will explore the use of music as a coping skill   Summary of Patient Progress:   Patient given the opportunity to attend group, however, declined.   Therapeutic Modalities:  Solution Focused Brief Therapy Activity  Rozann Lesches, [place] 11/07/2022  3:45 PM

## 2022-11-07 NOTE — Evaluation (Signed)
Physical Therapy Evaluation Patient Details Name: Shane Chambers MRN: [id number] DOB: 01-15-1961 Today's Date: 11/07/2022  History of Present Illness  Pt is a 61 y.o. male with medical history significant of anemia, coronary artery disease, COPD, depression, diabetes mellitus type 2, GERD, hyperlipidemia, hypertension, hypothyroidism, ischemic cardiomyopathy, peripheral vascular disease, and sleep apnea admitted to the hospital in [place] on the 12/18 after presenting from his living facility with complaints of suicidal thoughts. MD assessment includes: severe recurrent major depression, SOB, and acute on chronic combined systolic and diastolic CHF.  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   Clinical Impression  Pt was pleasant and motivated to participate during the session and put forth good effort throughout. Pt required mod physical assist with all bed mobility tasks and once in sitting presented with good static sitting balance.  Pt put forth good effort during multiple sit to stand attempts but was unable to clear the surface of the mattress.  Pt reported being told that he should be using a shrinker on his RLE with MD notified.  Pt will benefit from HHPT upon discharge to safely address deficits listed in patient problem list as well as to progress pt in preparation for prosthetic leg for decreased caregiver assistance and eventual return to PLOF.         Recommendations for follow up therapy are one component of a multi-disciplinary discharge planning process, led by the attending physician.  Recommendations may be updated based on patient status, additional functional criteria and insurance authorization.  Follow Up Recommendations Home health PT Can patient physically be transported by private vehicle: No    Assistance Recommended at Discharge Frequent or constant Supervision/Assistance  Patient can return home with the following  Two people to help with walking and/or transfers;Direct  supervision/assist for medications management;Assist for transportation;Help with stairs or ramp for entrance;A lot of help with bathing/dressing/bathroom    Equipment Recommendations Other (comment) (TBD)  Recommendations for Other Services       Functional Status Assessment Patient has had a recent decline in their functional status and/or demonstrates limited ability to make significant improvements in function in a reasonable and predictable amount of time     Precautions / Restrictions Precautions Precautions: Fall Restrictions Weight Bearing Restrictions: No      Mobility  Bed Mobility Overal bed mobility: Needs Assistance Bed Mobility: Supine to Sit, Sit to Supine, Rolling Rolling: Mod assist   Supine to sit: Mod assist Sit to supine: Mod assist   General bed mobility comments: Mod A for BLE and trunk control with cues for sequencing    Transfers                   General transfer comment: Pt able to moderately unweight his bottom from the mattress with heavy assist but unable to clear the surface of the bed    Ambulation/Gait                  Stairs            Wheelchair Mobility    Modified Rankin (Stroke Patients Only)       Balance Overall balance assessment: Needs assistance Sitting-balance support: Feet unsupported Sitting balance-Leahy Scale: Good         Standing balance comment: Unable to stand                             Pertinent Vitals/Pain Pain Assessment Pain Assessment: 0-10  Pain Score: 9  Pain Location: BLEs Pain Descriptors / Indicators: Sore Pain Intervention(s): Repositioned, Premedicated before session, Monitored during session    Home Living                     Additional Comments: Pt lives at the [facility] facility in [place]    Prior Function Prior Level of Function : Needs assist             Mobility Comments: [employer] fast streamer for transfers, Mod Ind w/c mobility in facility,  has not been able to transfer or take steps for around 2 months ADLs Comments: Assist from staff with ADLs     Hand Dominance   Dominant Hand: Right    Extremity/Trunk Assessment   Upper Extremity Assessment Upper Extremity Assessment: Generalized weakness    Lower Extremity Assessment Lower Extremity Assessment: Generalized weakness       Communication   Communication: No difficulties  Cognition Arousal/Alertness: Awake/alert Behavior During Therapy: WFL for tasks assessed/performed Overall Cognitive Status: Within Functional Limits for tasks assessed                                          General Comments      Exercises Total Joint Exercises Quad Sets: AROM, Strengthening, Right, 10 reps, 15 reps Gluteal Sets: Strengthening, Both, 10 reps, 15 reps Straight Leg Raises: Strengthening, Right, 5 reps, 10 reps Long Arc Quad: Strengthening, Right, 5 reps, 10 reps Knee Flexion: Strengthening, Right, 5 reps, 10 reps Bridges: Strengthening, Both, 5 reps, 10 reps (with bridge under R knee) Other Exercises Other Exercises: Positioning education to promote R knee ext PROM Other Exercises: HEP education for R BKA Other Exercises: Max effort sit to stands from elevated EOB 2 x 5 reps Other Exercises: Multiple rolling L/R   Assessment/Plan    PT Assessment Patient needs continued PT services  PT Problem List Decreased strength;Decreased range of motion;Decreased activity tolerance;Decreased balance;Decreased mobility;Pain;Decreased knowledge of use of DME       PT Treatment Interventions DME instruction;Therapeutic activities;Therapeutic exercise;Functional mobility training;Balance training;Patient/family education    PT Goals (Current goals can be found in the Care Plan section)  Acute Rehab PT Goals Patient Stated Goal: To get stronger PT Goal Formulation: With patient Time For Goal Achievement: 11/20/22 Potential to Achieve Goals: Fair     Frequency Min 2X/week     Co-evaluation               AM-PAC PT "6 Clicks" Mobility  Outcome Measure Help needed turning from your back to your side while in a flat bed without using bedrails?: A Lot Help needed moving from lying on your back to sitting on the side of a flat bed without using bedrails?: A Lot Help needed moving to and from a bed to a chair (including a wheelchair)?: Total Help needed standing up from a chair using your arms (e.g., wheelchair or bedside chair)?: Total Help needed to walk in hospital room?: Total Help needed climbing 3-5 steps with a railing? : Total 6 Click Score: 8    End of Session   Activity Tolerance: Patient tolerated treatment well Patient left: in bed;with call bell/phone within reach;with bed alarm set;with nursing/sitter in room Nurse Communication: Mobility status PT Visit Diagnosis: Muscle weakness (generalized) (M62.81)    Time: 5956-3875 PT Time Calculation (min) (ACUTE ONLY): 32 min   Charges:  PT Evaluation $PT Eval Moderate Complexity: 1 Mod PT Treatments $Therapeutic Exercise: 8-22 mins $Therapeutic Activity: 8-22 mins       D. Royetta Asal PT, DPT 11/07/22, 12:31 PM

## 2022-11-08 DIAGNOSIS — F332 Major depressive disorder, recurrent severe without psychotic features: Secondary | ICD-10-CM | POA: Diagnosis not present

## 2022-11-08 LAB — HEMOGLOBIN A1C
Hgb A1c MFr Bld: 6.1 % — ABNORMAL HIGH (ref 4.8–5.6)
Mean Plasma Glucose: 128 mg/dL

## 2022-11-08 NOTE — Group Note (Signed)
LCSW Group Therapy Note  Group Date: 11/08/2022 Start Time: 1300 End Time: 1400   Type of Therapy and Topic:  Group Therapy: Positive Affirmations  Participation Level:  Did Not Attend   Description of Group:   This group addressed positive affirmation towards self and others.  Patients went around the room and identified two positive things about themselves and two positive things about a peer in the room.  Patients reflected on how it felt to share something positive with others, to identify positive things about themselves, and to hear positive things from others/ Patients were encouraged to have a daily reflection of positive characteristics or circumstances.   Therapeutic Goals: Patients will verbalize two of their positive qualities Patients will demonstrate empathy for others by stating two positive qualities about a peer in the group Patients will verbalize their feelings when voicing positive self affirmations and when voicing positive affirmations of others Patients will discuss the potential positive impact on their wellness/recovery of focusing on positive traits of self and others.   Therapeutic Modalities:   Cognitive Behavioral Therapy Motivational Interviewing    Windle Guard, Wayne 11/08/2022  2:56 PM

## 2022-11-08 NOTE — Progress Notes (Signed)
Patient did not attend group.

## 2022-11-08 NOTE — Plan of Care (Signed)
  Problem: Clinical Measurements: Goal: Cardiovascular complication will be avoided Outcome: Progressing   Problem: Nutrition: Goal: Adequate nutrition will be maintained Outcome: Progressing   Problem: Coping: Goal: Level of anxiety will decrease Outcome: Progressing   Problem: Safety: Goal: Ability to remain free from injury will improve Outcome: Progressing   Problem: Coping: Goal: Ability to verbalize frustrations and anger appropriately will improve Outcome: Progressing   Problem: Safety: Goal: Periods of time without injury will increase Outcome: Progressing   Problem: Health Behavior/Discharge Planning: Goal: Ability to manage health-related needs will improve Outcome: Not Progressing   Problem: Activity: Goal: Risk for activity intolerance will decrease Outcome: Not Progressing   Problem: Elimination: Goal: Will not experience complications related to bowel motility Outcome: Not Progressing   Problem: Pain Managment: Goal: General experience of comfort will improve Outcome: Not Progressing   Problem: Activity: Goal: Interest or engagement in activities will improve Outcome: Not Progressing

## 2022-11-08 NOTE — Consult Note (Signed)
[facility] Nurse wound follow up Writer was sent a secure chat message regarding wound care for patient.  [facility] team had performed a remote consult earlier today and provided topical guidance for care.  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  Staples were removed on 12/23 and site was noted to be healed.  Later notes indicate patient was picking at this site because he "has nothing else to do".  My partner had implemented Xeroform gauze orders earlier today.  I advised RN that if she felt the Xeroform gauze was not appropriate for the site, to please inform Dr Sharol Given as this was his surgical patient. Surgery was 10/05/22. After further review and assessment, RN stated the Xeroform worked well and no further action was needed.  The left leg has an abrasion that was noted to be healed, but if that is not the case, the Xeroform is appropriate for this site as well.  Patient is having frequent loose bowel movements as well as urinary incontinence.  There is incontinence associated dermatitis to the buttocks and sacral area.  This is the same wound that is referred to as a wound on the "back" in MD notes.  There are no additional back wounds and Gerhardts paste is appropriate for this area in addition to prompt cleansing of skin when incontinent. Avoiding disposable pads and briefs would be advised to promote skin healing.   Wound type:  Dehiscence to BKA site.  Moisture associate skin damage to buttocks and sacrum Left lower leg abrasion Measurement:bedside RN to assess and document in flow sheet.   Dressing procedure/placement/frequency: orders  provided, see above Thank you for allowing [place] to partner with you in this patient's care.   Will not follow at this time.  Please re-consult if needed.  Estrellita Ludwig MSN, RN, FNP-BC CWON Wound, Ostomy, Continence Nurse [facility] [phone] Pager [phone]

## 2022-11-08 NOTE — Progress Notes (Signed)
Patient is cooperative but confused at times during assessment.  Patient is medication complaint.  Patient is isolative to room this shift , patient states it's too much trouble to get up and he doesn't like to be around a lot of people.  Patient endorse anxiety and depression 8/10 due to his medical condition.  Patient states he is tired of living this way and just wants to go to [place] where he can legally commit suicide and get assistance. Patient states he is having Auditory and visual hallucinations.  Patient states he is hearing voices but can't make out what they are saying.  Patient states he sees men in combat pants and no shirts and that he has seen 3 mouse running around in his room.  Patient medicated for pain x 1 with effect.  Patient has been incontinent  of bowel and bladder x 2  and has used the urinal x2 so far this shift.  Patient can contract for safety while in the hospital.  Q 15 minute rounds in progress, will continue to monitor.

## 2022-11-08 NOTE — Progress Notes (Signed)
[facility] MD Progress Note  11/08/2022 1:01 PM Shane Chambers  MRN:  [id number] Subjective: Shane Chambers was seen on rounds.  He does not have much to say to me today.  No complaints.  Nurses state that he does complain of depression.  On my can make any changes with his current medications since today is my first abdomen.  He has been compliant with medications and no side effects.  He is able contract for safety in the hospital. Principal Problem: Severe recurrent major depression without psychotic features ([place]) Diagnosis: Principal Problem:   Severe recurrent major depression without psychotic features ([place]) Active Problems:   SOB (shortness of breath)   Hypertension   Type 2 diabetes mellitus (HCC)   Normocytic anemia   Acute on chronic combined systolic and diastolic CHF (congestive heart failure) (HCC)   Ischemic cardiomyopathy   Rectal cancer ([place])   Hypothyroidism  Total Time spent with patient: 15 minutes  Past Psychiatric History: 61 year old man with a history of multiple medical problems and depression. Admitted to the hospital in [place] on the 18th after presenting from his living facility with complaints of suicidal thoughts.  Patient had recently been started on Effexor and I believe also Abilify.   Past Medical History:  Past Medical History:  Diagnosis Date   Anemia    Arthritis    CAD (coronary artery disease)    a. s/p CABG in 03/2020 with LIMA-LAD, RIMA-PL, RA-D1-RI-OM1   Cancer ([place])    rectal   Cellulitis    COPD (chronic obstructive pulmonary disease) ([place])    Depression    Diabetes mellitus without complication (HCC)    GERD (gastroesophageal reflux disease)    History of kidney stones    Hyperlipidemia 12/01/2019   Hypertension    Hypothyroidism    Ischemic cardiomyopathy    a. EF 20-25% by echo in 02/2020 b. at 40% by echo in 03/2020 c. EF normalized to 60-65% by echo in 04/2020   Myocardial infarction [facility])    Peripheral vascular disease ([place])     Sleep apnea    Type 2 diabetes mellitus ([place])     Past Surgical History:  Procedure Laterality Date   AMPUTATION Right 10/05/2022   Procedure: AMPUTATION BELOW KNEE;  Surgeon: Newt Minion, MD;  Location: [place];  Service: Orthopedics;  Laterality: Right;   APPLICATION OF WOUND VAC Right 10/05/2022   Procedure: APPLICATION OF WOUND VAC;  Surgeon: Newt Minion, MD;  Location: [place];  Service: Orthopedics;  Laterality: Right;   BIOPSY  07/18/2021   Procedure: BIOPSY;  Surgeon: Eloise Harman, DO;  [facility];  Service: Endoscopy;;   BIOPSY  01/09/2022   Procedure: BIOPSY;  Surgeon: Eloise Harman, DO;  [facility];  Service: Endoscopy;;   COLONOSCOPY WITH PROPOFOL N/A 07/18/2021   [place]: 15 millimeter polyp removed from the sigmoid colon, 5 mm polyp removed from sigmoid colon.  Nonbleeding internal hemorrhoids.  Significant looping of the colon. sigmoid path showed invasive colonic adenocarcinoma involving tubular adenoma (invades to depth of 68m, carcinoma 165mfrom margin, no lymphovascular invasion, no poorly differentiated component.   CORONARY ARTERY BYPASS GRAFT N/A 03/13/2020   Procedure: CORONARY ARTERY BYPASS GRAFTING (CABG) times five using bilateral Internal mammary arteries and left radial artery;  Surgeon: AtWonda OldsMD;  [facility]: [facility] Service: Open Heart Surgery;  Laterality: N/A;   DENTAL SURGERY     ESOPHAGOGASTRODUODENOSCOPY (EGD) WITH PROPOFOL N/A 07/18/2021   [place]: 1  gastric polyp status post biopsy, gastritis. gastric bx with slight chronic inflammation and no H.pyori. GEJ polypectomy with mild inflammation only   FLEXIBLE SIGMOIDOSCOPY N/A 08/26/2021   [place]: Nonbleeding internal hemorrhoids.  15 mm ulcers from previous polypectomy found in the rectum.  No evidence of previous polyp.  Located 5 to 8 cm from anal verge.   FLEXIBLE SIGMOIDOSCOPY N/A 01/09/2022   Procedure: FLEXIBLE SIGMOIDOSCOPY;  Surgeon: Eloise Harman, DO;   [facility];  Service: Endoscopy;  Laterality: N/A;   POLYPECTOMY  07/18/2021   Procedure: POLYPECTOMY INTESTINAL;  Surgeon: Eloise Harman, DO;  [facility];  Service: Endoscopy;;   RADIAL ARTERY HARVEST Left 03/13/2020   Procedure: [place],;  Surgeon: [name], MD;  Location: [place];  Service: Open Heart Surgery;  Laterality: Left;   RIGHT/LEFT HEART CATH AND CORONARY ANGIOGRAPHY N/A 03/07/2020   Procedure: RIGHT/LEFT HEART CATH AND CORONARY ANGIOGRAPHY;  Surgeon: Sherren Mocha, MD;  Location: [place] [facility];  Service: Cardiovascular;  Laterality: N/A;   SUBMUCOSAL LIFTING INJECTION  01/09/2022   Procedure: SUBMUCOSAL LIFTING INJECTION;  Surgeon: Eloise Harman, DO;  [facility];  Service: Endoscopy;;   SUBMUCOSAL TATTOO INJECTION  01/09/2022   Procedure: SUBMUCOSAL TATTOO INJECTION;  Surgeon: Eloise Harman, DO;  [facility];  Service: Endoscopy;;   TEE WITHOUT CARDIOVERSION N/A 03/13/2020   Procedure: TRANSESOPHAGEAL ECHOCARDIOGRAM (TEE);  Surgeon: [name], MD;  Location: [place];  Service: Open Heart Surgery;  Laterality: N/A;   Family History:  Family History  Problem Relation Age of Onset   Hypertension Mother    Family Psychiatric  History: Unremarkable Social History:  Social History   Substance and Sexual Activity  Alcohol Use Not Currently   Comment: rarely     Social History   Substance and Sexual Activity  Drug Use No    Social History   Socioeconomic History   Marital status: Single    Spouse name: Not on file   Number of children: Not on file   Years of education: Not on file   Highest education level: Not on file  Occupational History   Not on file  Tobacco Use   Smoking status: Former    Packs/day: 1.50    Types: Cigarettes    Quit date: 05/25/1995    Years since quitting: 27.4   Smokeless tobacco: Never  Vaping Use   Vaping Use: Never used  Substance and Sexual  Activity   Alcohol use: Not Currently    Comment: rarely   Drug use: No   Sexual activity: Not on file  Other Topics Concern   Not on file  Social History Narrative   Not on file   Social Determinants of Health   Financial Resource Strain: Not on file  Food Insecurity: No Food Insecurity (11/05/2022)   Hunger Vital Sign    Worried About Running Out of Food in the Last Year: Never true    Ran Out of Food in the Last Year: Never true  Transportation Needs: No Transportation Needs (11/05/2022)   PRAPARE - Hydrologist (Medical): No    Lack of Transportation (Non-Medical): No  Physical Activity: Not on file  Stress: Not on file  Social Connections: Not on file   Additional Social History:                         Sleep:  Fair  Appetite:  Fair  Current Medications: Current Facility-Administered Medications  Medication Dose Route Frequency Provider Last Rate Last Admin   acetaminophen (TYLENOL) tablet 650 mg  650 mg Oral Q6H PRN Clapacs, Madie Reno, MD   650 mg at 11/07/22 2138   albuterol (VENTOLIN HFA) 108 (90 Base) MCG/ACT inhaler 2 puff  2 puff Inhalation Q6H PRN Clapacs, John T, MD   2 puff at 11/06/22 1602   alum & mag hydroxide-simeth (MAALOX/MYLANTA) 200-200-20 MG/5ML suspension 30 mL  30 mL Oral Q4H PRN Clapacs, John T, MD       amLODipine (NORVASC) tablet 5 mg  5 mg Oral Daily Clapacs, John T, MD   5 mg at 11/08/22 1055   ARIPiprazole (ABILIFY) tablet 5 mg  5 mg Oral Daily Clapacs, John T, MD   5 mg at 11/08/22 1055   aspirin EC tablet 81 mg  81 mg Oral Daily Clapacs, John T, MD   81 mg at 11/08/22 1053   atorvastatin (LIPITOR) tablet 80 mg  80 mg Oral QHS Clapacs, John T, MD   80 mg at 11/07/22 2138   docusate sodium (COLACE) capsule 100 mg  100 mg Oral BID Clapacs, John T, MD   100 mg at 11/07/22 2138   ferrous gluconate (FERGON) tablet 324 mg  324 mg Oral Q breakfast Clapacs, John T, MD   324 mg at 11/08/22 0821   fluticasone  furoate-vilanterol (BREO ELLIPTA) 100-25 MCG/ACT 1 puff  1 puff Inhalation Daily Clapacs, Madie Reno, MD   1 puff at 11/08/22 2536   Gerhardt's butt cream   Topical TID Clapacs, Madie Reno, MD   Given at 11/08/22 1217   levothyroxine (SYNTHROID) tablet 75 mcg  75 mcg Oral Q0600 Clapacs, Madie Reno, MD   75 mcg at 11/08/22 0608   magnesium hydroxide (MILK OF MAGNESIA) suspension 30 mL  30 mL Oral Daily PRN Clapacs, John T, MD       magnesium oxide (MAG-OX) tablet 400 mg  400 mg Oral Daily Clapacs, John T, MD   400 mg at 11/08/22 1055   metFORMIN (GLUCOPHAGE) tablet 1,000 mg  1,000 mg Oral BID WC Clapacs, John T, MD   1,000 mg at 11/08/22 0820   metoprolol tartrate (LOPRESSOR) tablet 50 mg  50 mg Oral BID Clapacs, John T, MD   50 mg at 11/08/22 1055   pantoprazole (PROTONIX) EC tablet 40 mg  40 mg Oral Daily Clapacs, John T, MD   40 mg at 11/08/22 1053   polyethylene glycol (MIRALAX / GLYCOLAX) packet 17 g  17 g Oral Daily Clapacs, John T, MD   17 g at 11/06/22 6440   potassium chloride SA (KLOR-CON M) CR tablet 20 mEq  20 mEq Oral Daily Clapacs, John T, MD   20 mEq at 11/08/22 1054   senna-docusate (Senokot-S) tablet 1 tablet  1 tablet Oral QHS PRN Clapacs, Madie Reno, MD       tamsulosin (FLOMAX) capsule 0.4 mg  0.4 mg Oral QPC breakfast Clapacs, John T, MD   0.4 mg at 11/08/22 1054   thiamine (VITAMIN B1) tablet 100 mg  100 mg Oral Daily Clapacs, John T, MD   100 mg at 11/08/22 1054   torsemide (DEMADEX) tablet 20 mg  20 mg Oral Daily Clapacs, John T, MD   20 mg at 11/08/22 1055   venlafaxine XR (EFFEXOR-XR) 24 hr capsule 75 mg  75 mg Oral Q breakfast Clapacs, Madie Reno, MD   75 mg at  11/08/22 0820    Lab Results:  Results for orders placed or performed during the hospital encounter of 11/05/22 (from the past 48 hour(s))  Glucose, capillary     Status: Abnormal   Collection Time: 11/06/22  4:04 PM  Result Value Ref Range   Glucose-Capillary 154 (H) 70 - 99 mg/dL    Comment: Glucose reference range applies only  to samples taken after fasting for at least 8 hours.  Hemoglobin A1c     Status: Abnormal   Collection Time: 11/07/22  7:23 AM  Result Value Ref Range   Hgb A1c MFr Bld 6.1 (H) 4.8 - 5.6 %    Comment: (NOTE)         Prediabetes: 5.7 - 6.4         Diabetes: >6.4         Glycemic control for adults with diabetes: <7.0    Mean Plasma Glucose 128 mg/dL    Comment: (NOTE) Performed At: [facility] [address] [place], [place] [id number] Rush Farmer MD [id number]   Lipid panel     Status: Abnormal   Collection Time: 11/07/22  7:23 AM  Result Value Ref Range   Cholesterol 147 0 - 200 mg/dL   Triglycerides 159 (H) <150 mg/dL   HDL 35 (L) >40 mg/dL   Total CHOL/HDL Ratio 4.2 RATIO   VLDL 32 0 - 40 mg/dL   LDL Cholesterol 80 0 - 99 mg/dL    Comment:        Total Cholesterol/HDL:CHD Risk Coronary Heart Disease Risk Table                     Men   Women  1/2 Average Risk   3.4   3.3  Average Risk       5.0   4.4  2 X Average Risk   9.6   7.1  3 X Average Risk  23.4   11.0        Use the calculated Patient Ratio above and the CHD Risk Table to determine the patient's CHD Risk.        ATP III CLASSIFICATION (LDL):  <100     mg/dL   Optimal  100-129  mg/dL   Near or Above                    Optimal  130-159  mg/dL   Borderline  160-189  mg/dL   High  >190     mg/dL   Very High Performed at [facility], [address] [place], [place] [address]     Blood Alcohol level:  Lab Results  Component Value Date   [facility] <10 82/50/0370    Metabolic Disorder Labs: Lab Results  Component Value Date   HGBA1C 6.1 (H) 11/07/2022   MPG 128 11/07/2022   MPG 114.02 09/26/2022   No results found for: "PROLACTIN" Lab Results  Component Value Date   CHOL 147 11/07/2022   TRIG 159 (H) 11/07/2022   HDL 35 (L) 11/07/2022   CHOLHDL 4.2 11/07/2022   VLDL 32 11/07/2022   LDLCALC 80 11/07/2022   LDLCALC 82 08/02/2020    Physical Findings: AIMS:  , ,  ,  ,     CIWA:    COWS:     Musculoskeletal: Strength & Muscle Tone: within normal limits Gait & Station: normal Patient leans: N/A  Psychiatric Specialty Exam:  Presentation  General Appearance:  Casual  Eye Contact: Good  Speech: Clear and Coherent  Speech Volume: Normal  Handedness: Right   Mood and Affect  Mood: Dysphoric  Affect: Congruent; Appropriate   Thought Process  Thought Processes: Coherent  Descriptions of Associations:Intact  Orientation:Full (Time, Place and Person)  Thought Content:Logical; Perseveration  History of Schizophrenia/Schizoaffective disorder:No  Duration of Psychotic Symptoms:N/A  Hallucinations:No data recorded Ideas of Reference:None  Suicidal Thoughts:No data recorded Homicidal Thoughts:No data recorded  Sensorium  Memory: Immediate Good; Recent Good  Judgment: Fair  Insight: Shallow   Executive Functions  Concentration: Fair  Attention Span: Fair  Recall: [place] of Knowledge: Fair  Language: Fair   Psychomotor Activity  Psychomotor Activity:No data recorded  Assets  Assets: Resilience; Financial Resources/Insurance; Communication Skills   Sleep  Sleep:No data recorded   Physical Exam: Physical Exam: Flat ROS: Depressed Blood pressure 127/62, pulse 65, temperature 98.5 F (36.9 C), temperature source Oral, resp. rate 18, height '5\' 9"'$  (1.753 m), weight 107 kg, SpO2 98 %. Body mass index is 34.85 kg/m.   Treatment Plan Summary: Daily contact with patient to assess and evaluate symptoms and progress in treatment, Medication management, and Plan continue current medications.  [place], DO 11/08/2022, 1:01 PM

## 2022-11-08 NOTE — Progress Notes (Signed)
Shane Chambers had a large bowel movement today. He was assisted to the bathroom x2 on this shift. He was OOB for breakfast and was in a pleasant mood. He was medication compliant. Denies SI/HI AVH, expresses no further concerns at this time.

## 2022-11-09 DIAGNOSIS — F332 Major depressive disorder, recurrent severe without psychotic features: Secondary | ICD-10-CM | POA: Diagnosis not present

## 2022-11-09 MED ORDER — ARIPIPRAZOLE 5 MG PO TABS
10.0000 mg | ORAL_TABLET | Freq: Every day | ORAL | Status: DC
  Administered 2022-11-10: 10 mg via ORAL
  Filled 2022-11-09: qty 2

## 2022-11-09 MED ORDER — VENLAFAXINE HCL ER 75 MG PO CP24
150.0000 mg | ORAL_CAPSULE | Freq: Every day | ORAL | Status: DC
  Administered 2022-11-10: 150 mg via ORAL
  Filled 2022-11-09: qty 2

## 2022-11-09 NOTE — Progress Notes (Signed)
Occupational Therapy Treatment Patient Details Name: Shane Chambers MRN: [id number] DOB: 08-02-1961 Today's Date: 11/09/2022   History of present illness Pt is a 61 y.o. male with medical history significant of anemia, coronary artery disease, COPD, depression, diabetes mellitus type 2, GERD, hyperlipidemia, hypertension, hypothyroidism, ischemic cardiomyopathy, peripheral vascular disease, and sleep apnea admitted to the hospital in [place] on the 12/18 after presenting from his living facility with complaints of suicidal thoughts. MD assessment includes: severe recurrent major depression, SOB, and acute on chronic combined systolic and diastolic CHF.  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   OT comments  Chart reviewed, pt greeted in bed agreeable to OT tx session. Pt agreeable to OT tx session, tx session targeted improving functional activity tolerance to improve Adl task completion. Pt reports he was transferring with RW prior to BKA, using patient care lift s/p BKA. Pt performed lateral scoot with MOD-MAX A +1 on this date to bedside chair. Grooming tasks completed with SETUP. Team messaged re: new openings noted around incision,need for mwc with support for R residual limb. Progress is made towards goals, pt continues to report he will not discharge to rehab and wants his own house. OT will continue to follow acutely.    Recommendations for follow up therapy are one component of a multi-disciplinary discharge planning process, led by the attending physician.  Recommendations may be updated based on patient status, additional functional criteria and insurance authorization.    Follow Up Recommendations  Skilled nursing-short term rehab (<3 hours/day)     Assistance Recommended at Discharge Frequent or constant Supervision/Assistance  Patient can return home with the following  Two people to help with walking and/or transfers;Two people to help with bathing/dressing/bathroom;Assistance with  cooking/housework;Direct supervision/assist for medications management;Assist for transportation;Help with stairs or ramp for entrance;Direct supervision/assist for financial management   Equipment Recommendations  BSC/3in1;Tub/shower seat;Wheelchair (measurements OT);Wheelchair cushion (measurements OT);Hospital bed    Recommendations for Other Services      Precautions / Restrictions Precautions Precautions: Fall Restrictions Other Position/Activity Restrictions: no activity/WB orders in chart, wound along R BKA incision, treated as NWBing today       Mobility Bed Mobility Overal bed mobility: Needs Assistance Bed Mobility: Supine to Sit     Supine to sit: Supervision          Transfers                         Balance Overall balance assessment: Needs assistance Sitting-balance support: Single extremity supported Sitting balance-Leahy Scale: Good       Standing balance-Leahy Scale: Zero                             ADL either performed or assessed with clinical judgement   ADL Overall ADL's : Needs assistance/impaired     Grooming: Wash/dry face;Set up;Sitting Grooming Details (indicate cue type and reason): at edge of bed             Lower Body Dressing: Maximal assistance   Toilet Transfer: Maximal assistance Toilet Transfer Details (indicate cue type and reason): lateral scoot to beside chair                Extremity/Trunk Assessment              Vision       Perception     Praxis      Cognition Arousal/Alertness: Awake/alert Behavior During  Therapy: WFL for tasks assessed/performed, Flat affect Overall Cognitive Status: No family/caregiver present to determine baseline cognitive functioning Area of Impairment: Following commands, Safety/judgement, Awareness                       Following Commands: Follows one step commands with increased time Safety/Judgement: Decreased awareness of safety,  Decreased awareness of deficits Awareness: Intellectual Problem Solving: Requires verbal cues, Requires tactile cues          Exercises Other Exercises Other Exercises: educated re: importance of continued mobility efforts, residual limb care    Shoulder Instructions       General Comments BKA incision with additional opening along the site, RN notified; redness around incision noted, RNs notified;    Pertinent Vitals/ Pain       Pain Assessment Pain Assessment: No/denies pain  Home Living                                          Prior Functioning/Environment              Frequency  Min 2X/week        Progress Toward Goals  OT Goals(current goals can now be found in the care plan section)  Progress towards OT goals: Progressing toward goals     Plan Discharge plan remains appropriate;Frequency remains appropriate    Co-evaluation                 AM-PAC OT "6 Clicks" Daily Activity     Outcome Measure   Help from another person eating meals?: None Help from another person taking care of personal grooming?: A Little Help from another person toileting, which includes using toliet, bedpan, or urinal?: Total Help from another person bathing (including washing, rinsing, drying)?: A Lot Help from another person to put on and taking off regular upper body clothing?: A Lot Help from another person to put on and taking off regular lower body clothing?: A Lot 6 Click Score: 14    End of Session    OT Visit Diagnosis: Unsteadiness on feet (R26.81);Muscle weakness (generalized) (M62.81)   Activity Tolerance Patient tolerated treatment well   Patient Left in chair (beside chair in day room with patient care lift sling applied)   Nurse Communication Mobility status (wounds)        Time: 9509-3267 OT Time Calculation (min): 34 min  Charges: OT General Charges $OT Visit: 1 Visit OT Treatments $Self Care/Home Management : 8-22  mins $Therapeutic Activity: 8-22 mins  Shanon Payor, OTD OTR/L  11/09/22, 3:46 PM

## 2022-11-09 NOTE — Plan of Care (Incomplete)
Pt was in received in bed sleeping. Pt woke up took medication, stated pt wanted to go back to sleep. Pt had snack, medications, pt turned from back to side in bed using rail with HOB elevated. Pt wanted HOB placed back to flat to sleep. Pt used urinal at bedside. Pt denies SI.    Problem: Safety: Goal: Ability to remain free from injury will improve Outcome: Progressing

## 2022-11-09 NOTE — Progress Notes (Addendum)
Surgical incision on right leg starting to open in another area.  Dr. Louis Meckel made aware.  RN will apply xeroform and cover with bandage.

## 2022-11-09 NOTE — Progress Notes (Signed)
[facility] MD Progress Note  11/09/2022 10:56 AM Shane Chambers  MRN:  [id number] Subjective: Shane Chambers is seen on rounds.  He still depressed but denies any side effects from his medications.  He states that they have been helpful in the past we talked about going up on them.  He was in agreement.  He is able to contract for safety in the hospital. Principal Problem: Severe recurrent major depression without psychotic features ([place]) Diagnosis: Principal Problem:   Severe recurrent major depression without psychotic features (Shane Chambers) Active Problems:   SOB (shortness of breath)   Hypertension   Type 2 diabetes mellitus (HCC)   Normocytic anemia   Acute on chronic combined systolic and diastolic CHF (congestive heart failure) (HCC)   Ischemic cardiomyopathy   Rectal cancer ([place])   Hypothyroidism  Total Time spent with patient: 15 minutes  Past Psychiatric History:  61 year old man with a history of multiple medical problems and depression. Admitted to the hospital in [place] on the 18th after presenting from his living facility with complaints of suicidal thoughts.  Patient had recently been started on Effexor and I believe also Abilify.   Past Medical History:  Past Medical History:  Diagnosis Date   Anemia    Arthritis    CAD (coronary artery disease)    a. s/p CABG in 03/2020 with LIMA-LAD, RIMA-PL, RA-D1-RI-OM1   Cancer ([place])    rectal   Cellulitis    COPD (chronic obstructive pulmonary disease) ([place])    Depression    Diabetes mellitus without complication (HCC)    GERD (gastroesophageal reflux disease)    History of kidney stones    Hyperlipidemia 12/01/2019   Hypertension    Hypothyroidism    Ischemic cardiomyopathy    a. EF 20-25% by echo in 02/2020 b. at 40% by echo in 03/2020 c. EF normalized to 60-65% by echo in 04/2020   Myocardial infarction [facility])    Peripheral vascular disease ([place])    Sleep apnea    Type 2 diabetes mellitus ([place])     Past Surgical History:   Procedure Laterality Date   AMPUTATION Right 10/05/2022   Procedure: AMPUTATION BELOW KNEE;  Surgeon: Newt Minion, MD;  Location: [place];  Service: Orthopedics;  Laterality: Right;   APPLICATION OF WOUND VAC Right 10/05/2022   Procedure: APPLICATION OF WOUND VAC;  Surgeon: Newt Minion, MD;  Location: [place];  Service: Orthopedics;  Laterality: Right;   BIOPSY  07/18/2021   Procedure: BIOPSY;  Surgeon: Eloise Harman, DO;  [facility];  Service: Endoscopy;;   BIOPSY  01/09/2022   Procedure: BIOPSY;  Surgeon: Eloise Harman, DO;  [facility];  Service: Endoscopy;;   COLONOSCOPY WITH PROPOFOL N/A 07/18/2021   Carver: 15 millimeter polyp removed from the sigmoid colon, 5 mm polyp removed from sigmoid colon.  Nonbleeding internal hemorrhoids.  Significant looping of the colon. sigmoid path showed invasive colonic adenocarcinoma involving tubular adenoma (invades to depth of 38m, carcinoma 174mfrom margin, no lymphovascular invasion, no poorly differentiated component.   CORONARY ARTERY BYPASS GRAFT N/A 03/13/2020   Procedure: CORONARY ARTERY BYPASS GRAFTING (CABG) times five using bilateral Internal mammary arteries and left radial artery;  Surgeon: AtWonda OldsMD;  [place] [facility] Service: Open Heart Surgery;  Laterality: N/A;   DENTAL SURGERY     ESOPHAGOGASTRODUODENOSCOPY (EGD) WITH PROPOFOL N/A 07/18/2021   Carver: 1 gastric polyp status post biopsy, gastritis. gastric bx with slight chronic inflammation and no  H.pyori. GEJ polypectomy with mild inflammation only   FLEXIBLE SIGMOIDOSCOPY N/A 08/26/2021   Carver: Nonbleeding internal hemorrhoids.  15 mm ulcers from previous polypectomy found in the rectum.  No evidence of previous polyp.  Located 5 to 8 cm from anal verge.   FLEXIBLE SIGMOIDOSCOPY N/A 01/09/2022   Procedure: FLEXIBLE SIGMOIDOSCOPY;  Surgeon: Eloise Harman, DO;  [facility];  Service: Endoscopy;  Laterality: N/A;   POLYPECTOMY   07/18/2021   Procedure: POLYPECTOMY INTESTINAL;  Surgeon: Eloise Harman, DO;  [facility];  Service: Endoscopy;;   RADIAL ARTERY HARVEST Left 03/13/2020   Procedure: [place],;  Surgeon: [name], MD;  Location: [place];  Service: Open Heart Surgery;  Laterality: Left;   RIGHT/LEFT HEART CATH AND CORONARY ANGIOGRAPHY N/A 03/07/2020   Procedure: RIGHT/LEFT HEART CATH AND CORONARY ANGIOGRAPHY;  Surgeon: Sherren Mocha, MD;  Location: [place] [facility];  Service: Cardiovascular;  Laterality: N/A;   SUBMUCOSAL LIFTING INJECTION  01/09/2022   Procedure: SUBMUCOSAL LIFTING INJECTION;  Surgeon: Eloise Harman, DO;  [facility];  Service: Endoscopy;;   SUBMUCOSAL TATTOO INJECTION  01/09/2022   Procedure: SUBMUCOSAL TATTOO INJECTION;  Surgeon: Eloise Harman, DO;  [facility];  Service: Endoscopy;;   TEE WITHOUT CARDIOVERSION N/A 03/13/2020   Procedure: TRANSESOPHAGEAL ECHOCARDIOGRAM (TEE);  Surgeon: [name], MD;  Location: [place];  Service: Open Heart Surgery;  Laterality: N/A;   Family History:  Family History  Problem Relation Age of Onset   Hypertension Mother    Family Psychiatric  History: Unremarkable Social History:  Social History   Substance and Sexual Activity  Alcohol Use Not Currently   Comment: rarely     Social History   Substance and Sexual Activity  Drug Use No    Social History   Socioeconomic History   Marital status: Single    Spouse name: Not on file   Number of children: Not on file   Years of education: Not on file   Highest education level: Not on file  Occupational History   Not on file  Tobacco Use   Smoking status: Former    Packs/day: 1.50    Types: Cigarettes    Quit date: 05/25/1995    Years since quitting: 27.4   Smokeless tobacco: Never  Vaping Use   Vaping Use: Never used  Substance and Sexual Activity   Alcohol use: Not Currently    Comment: rarely   Drug use: No    Sexual activity: Not on file  Other Topics Concern   Not on file  Social History Narrative   Not on file   Social Determinants of Health   Financial Resource Strain: Not on file  Food Insecurity: No Food Insecurity (11/05/2022)   Hunger Vital Sign    Worried About Running Out of Food in the Last Year: Never true    Ran Out of Food in the Last Year: Never true  Transportation Needs: No Transportation Needs (11/05/2022)   PRAPARE - Hydrologist (Medical): No    Lack of Transportation (Non-Medical): No  Physical Activity: Not on file  Stress: Not on file  Social Connections: Not on file   Additional Social History:                         Sleep: Good  Appetite:  Good  Current Medications: Current Facility-Administered Medications  Medication Dose  Route Frequency Provider Last Rate Last Admin   acetaminophen (TYLENOL) tablet 650 mg  650 mg Oral Q6H PRN Clapacs, John T, MD   650 mg at 11/08/22 2127   albuterol (VENTOLIN HFA) 108 (90 Base) MCG/ACT inhaler 2 puff  2 puff Inhalation Q6H PRN Clapacs, John T, MD   2 puff at 11/06/22 1602   alum & mag hydroxide-simeth (MAALOX/MYLANTA) 200-200-20 MG/5ML suspension 30 mL  30 mL Oral Q4H PRN Clapacs, John T, MD       amLODipine (NORVASC) tablet 5 mg  5 mg Oral Daily Clapacs, John T, MD   5 mg at 11/09/22 0835   [START ON 11/10/2022] ARIPiprazole (ABILIFY) tablet 10 mg  10 mg Oral Daily Parks Ranger, DO       aspirin EC tablet 81 mg  81 mg Oral Daily Clapacs, John T, MD   81 mg at 11/09/22 0835   atorvastatin (LIPITOR) tablet 80 mg  80 mg Oral QHS Clapacs, John T, MD   80 mg at 11/08/22 2127   docusate sodium (COLACE) capsule 100 mg  100 mg Oral BID Clapacs, John T, MD   100 mg at 11/08/22 2127   ferrous gluconate (FERGON) tablet 324 mg  324 mg Oral Q breakfast Clapacs, John T, MD   324 mg at 11/09/22 0834   fluticasone furoate-vilanterol (BREO ELLIPTA) 100-25 MCG/ACT 1 puff  1 puff Inhalation  Daily Clapacs, Madie Reno, MD   1 puff at 11/09/22 0962   Gerhardt's butt cream   Topical TID Clapacs, Madie Reno, MD   1 Application at 83/66/29 2129   levothyroxine (SYNTHROID) tablet 75 mcg  75 mcg Oral Q0600 Clapacs, John T, MD   75 mcg at 11/09/22 4765   magnesium hydroxide (MILK OF MAGNESIA) suspension 30 mL  30 mL Oral Daily PRN Clapacs, John T, MD       magnesium oxide (MAG-OX) tablet 400 mg  400 mg Oral Daily Clapacs, John T, MD   400 mg at 11/09/22 0835   metFORMIN (GLUCOPHAGE) tablet 1,000 mg  1,000 mg Oral BID WC Clapacs, John T, MD   1,000 mg at 11/09/22 0834   metoprolol tartrate (LOPRESSOR) tablet 50 mg  50 mg Oral BID Clapacs, John T, MD   50 mg at 11/09/22 0836   pantoprazole (PROTONIX) EC tablet 40 mg  40 mg Oral Daily Clapacs, Madie Reno, MD   40 mg at 11/09/22 0834   polyethylene glycol (MIRALAX / GLYCOLAX) packet 17 g  17 g Oral Daily Clapacs, John T, MD   17 g at 11/06/22 0917   potassium chloride SA (KLOR-CON M) CR tablet 20 mEq  20 mEq Oral Daily Clapacs, John T, MD   20 mEq at 11/09/22 0834   senna-docusate (Senokot-S) tablet 1 tablet  1 tablet Oral QHS PRN Clapacs, Madie Reno, MD       tamsulosin (FLOMAX) capsule 0.4 mg  0.4 mg Oral QPC breakfast Clapacs, John T, MD   0.4 mg at 11/09/22 4650   thiamine (VITAMIN B1) tablet 100 mg  100 mg Oral Daily Clapacs, John T, MD   100 mg at 11/09/22 0835   torsemide (DEMADEX) tablet 20 mg  20 mg Oral Daily Clapacs, John T, MD   20 mg at 11/09/22 0834   [START ON 11/10/2022] venlafaxine XR (EFFEXOR-XR) 24 hr capsule 150 mg  150 mg Oral Q breakfast Parks Ranger, DO        Lab Results: No results found for this or  any previous visit (from the past 48 hour(s)).  Blood Alcohol level:  Lab Results  Component Value Date   ETH <10 45/85/9292    Metabolic Disorder Labs: Lab Results  Component Value Date   HGBA1C 6.1 (H) 11/07/2022   MPG 128 11/07/2022   MPG 114.02 09/26/2022   No results found for: "PROLACTIN" Lab Results  Component  Value Date   CHOL 147 11/07/2022   TRIG 159 (H) 11/07/2022   HDL 35 (L) 11/07/2022   CHOLHDL 4.2 11/07/2022   VLDL 32 11/07/2022   LDLCALC 80 11/07/2022   LDLCALC 82 08/02/2020    Physical Findings: AIMS:  , ,  ,  ,    CIWA:    COWS:     Musculoskeletal: Strength & Muscle Tone: within normal limits Gait & Station: normal Patient leans: N/A  Psychiatric Specialty Exam:  Presentation  General Appearance:  Casual  Eye Contact: Good  Speech: Clear and Coherent  Speech Volume: Normal  Handedness: Right   Mood and Affect  Mood: Dysphoric  Affect: Congruent; Appropriate   Thought Process  Thought Processes: Coherent  Descriptions of Associations:Intact  Orientation:Full (Time, Place and Person)  Thought Content:Logical; Perseveration  History of Schizophrenia/Schizoaffective disorder:No  Duration of Psychotic Symptoms:N/A  Hallucinations:No data recorded Ideas of Reference:None  Suicidal Thoughts:No data recorded Homicidal Thoughts:No data recorded  Sensorium  Memory: Immediate Good; Recent Good  Judgment: Fair  Insight: Shallow   Executive Functions  Concentration: Fair  Attention Span: Fair  Recall: [place] of Knowledge: Fair  Language: Fair   Psychomotor Activity  Psychomotor Activity:No data recorded  Assets  Assets: Resilience; Financial Resources/Insurance; Communication Skills   Sleep  Sleep:No data recorded   Physical Exam: Physical Exam flat ROS depressed Blood pressure (!) 148/73, pulse 67, temperature 98.8 F (37.1 C), temperature source Oral, resp. rate 18, height '5\' 9"'$  (1.753 m), weight 107 kg, SpO2 97 %. Body mass index is 34.85 kg/m.   Treatment Plan Summary: Daily contact with patient to assess and evaluate symptoms and progress in treatment, Medication management, and Plan increase Effexor XR to 150 mg/day and increase Abilify to 10 mg/day.  Parks Ranger, DO 11/09/2022, 10:56  AM

## 2022-11-09 NOTE — Progress Notes (Signed)
Patient is alert and cooperative during assessment.  Patient is medication complaint.  Patient is isolative to room this shift.   Patient continues to endorse high anxiety and depression due to his medical condition.  Patient denies AVH and SI/HI at this time. Patient medicated for pain x 1 with effect.  Patient has been incontinent  of bowell x 2  and has used the urinal x2 thus far this shift.  Patient can contract for safety while in the hospital.  Q 15 minute rounds in progress, will continue to monitor.

## 2022-11-09 NOTE — Progress Notes (Addendum)
Patient assisted to dayroom by OT.  After dinner, patient assisted back to bed by 2 RNs.  Clean pad placed on bed.  Perineal care performed, cream put on bottom.  Clean brief on patient.   2nd wound on right leg dressed with xeroform and bandage.

## 2022-11-09 NOTE — Plan of Care (Signed)
  Problem: Safety: Goal: Ability to remain free from injury will improve Outcome: Progressing   Problem: Education: Goal: Knowledge of the prescribed therapeutic regimen will improve Outcome: Progressing   Problem: Coping: Goal: Coping ability will improve Outcome: Progressing   Problem: Health Behavior/Discharge Planning: Goal: Compliance with therapeutic regimen will improve Outcome: Progressing   Problem: Safety: Goal: Ability to disclose and discuss suicidal ideas will improve Outcome: Progressing   Problem: Skin Integrity: Goal: Risk for impaired skin integrity will decrease Outcome: Not Progressing   Problem: Self-Concept: Goal: Level of anxiety will decrease Outcome: Not Progressing

## 2022-11-09 NOTE — Progress Notes (Signed)
Patient's bed pad changed after BM. Perineal care completed.  Clean brief placed on patient.

## 2022-11-09 NOTE — Progress Notes (Signed)
Patient attempted to use urinal and soiled the bed.  Patient's linen changed, perineal care completed, cream applied to bottom.  Clean brief placed on patient.   Dressings on both leg wounds changed.

## 2022-11-09 NOTE — Progress Notes (Signed)
Patient provided perineal care.  Clean bed pad and brief in place.

## 2022-11-09 NOTE — Progress Notes (Signed)
Patient with anxious affect this morning.  Anxiety rated 9/10.  Depression rated 10/10.  Patient denies SI at this time, but wishes to go to [place] to commit suicide. Denies HI/AVH.  Compliant with scheduled medications.  15 min checks in place.  Patient is safe on the unit.    Patient had a BM right after shift change.  Patient cleaned, linen changed and clean brief placed on patient.  Patient sat on the side of his bed to eat breakfast.  Patient did not want to come to dayroom.

## 2022-11-09 NOTE — Progress Notes (Signed)
Patient did not attend group.

## 2022-11-10 DIAGNOSIS — R9431 Abnormal electrocardiogram [ECG] [EKG]: Secondary | ICD-10-CM | POA: Insufficient documentation

## 2022-11-10 DIAGNOSIS — F333 Major depressive disorder, recurrent, severe with psychotic symptoms: Secondary | ICD-10-CM

## 2022-11-10 DIAGNOSIS — L03031 Cellulitis of right toe: Secondary | ICD-10-CM

## 2022-11-10 DIAGNOSIS — M8610 Other acute osteomyelitis, unspecified site: Secondary | ICD-10-CM

## 2022-11-10 DIAGNOSIS — F332 Major depressive disorder, recurrent severe without psychotic features: Secondary | ICD-10-CM | POA: Diagnosis not present

## 2022-11-10 DIAGNOSIS — Z794 Long term (current) use of insulin: Secondary | ICD-10-CM

## 2022-11-10 DIAGNOSIS — E1159 Type 2 diabetes mellitus with other circulatory complications: Secondary | ICD-10-CM

## 2022-11-10 LAB — COMPREHENSIVE METABOLIC PANEL
ALT: 10 U/L (ref 0–44)
AST: 20 U/L (ref 15–41)
Albumin: 3 g/dL — ABNORMAL LOW (ref 3.5–5.0)
Alkaline Phosphatase: 74 U/L (ref 38–126)
Anion gap: 12 (ref 5–15)
BUN: 38 mg/dL — ABNORMAL HIGH (ref 6–20)
CO2: 24 mmol/L (ref 22–32)
Calcium: 8.9 mg/dL (ref 8.9–10.3)
Chloride: 100 mmol/L (ref 98–111)
Creatinine, Ser: 1.21 mg/dL (ref 0.61–1.24)
GFR, Estimated: >60 mL/min (ref >60)
Glucose, Bld: 181 mg/dL — ABNORMAL HIGH (ref 70–99)
Potassium: 4.1 mmol/L (ref 3.5–5.1)
Sodium: 136 mmol/L (ref 135–145)
Total Bilirubin: 0.5 mg/dL (ref 0.3–1.2)
Total Protein: 6.8 g/dL (ref 6.5–8.1)

## 2022-11-10 LAB — CBC WITH DIFFERENTIAL/PLATELET
Abs Immature Granulocytes: 0.1 10*3/uL — ABNORMAL HIGH (ref 0.00–0.07)
Basophils Absolute: 0.1 10*3/uL (ref 0.0–0.1)
Basophils Relative: 0 %
Eosinophils Absolute: 0.7 10*3/uL — ABNORMAL HIGH (ref 0.0–0.5)
Eosinophils Relative: 6 %
HCT: 28.5 % — ABNORMAL LOW (ref 39.0–52.0)
Hemoglobin: 9.1 g/dL — ABNORMAL LOW (ref 13.0–17.0)
Immature Granulocytes: 1 %
Lymphocytes Relative: 12 %
Lymphs Abs: 1.5 10*3/uL (ref 0.7–4.0)
MCH: 29 pg (ref 26.0–34.0)
MCHC: 31.9 g/dL (ref 30.0–36.0)
MCV: 90.8 fL (ref 80.0–100.0)
Monocytes Absolute: 1 10*3/uL (ref 0.1–1.0)
Monocytes Relative: 8 %
Neutro Abs: 9.3 10*3/uL — ABNORMAL HIGH (ref 1.7–7.7)
Neutrophils Relative %: 73 %
Platelets: 303 10*3/uL (ref 150–400)
RBC: 3.14 MIL/uL — ABNORMAL LOW (ref 4.22–5.81)
RDW: 15.8 % — ABNORMAL HIGH (ref 11.5–15.5)
WBC: 12.7 10*3/uL — ABNORMAL HIGH (ref 4.0–10.5)
nRBC: 0 % (ref 0.0–0.2)

## 2022-11-10 LAB — SEDIMENTATION RATE: Sed Rate: 81 mm/hr — ABNORMAL HIGH (ref 0–20)

## 2022-11-10 MED ORDER — AMOXICILLIN-POT CLAVULANATE 875-125 MG PO TABS: 1.0000 | ORAL_TABLET | Freq: Two times a day (BID) | ORAL | Status: DC

## 2022-11-10 MED ORDER — VANCOMYCIN HCL IN DEXTROSE 1-5 GM/200ML-% IV SOLN
1000.0000 mg | INTRAVENOUS | Status: DC
  Filled 2022-11-10: qty 200

## 2022-11-10 MED ORDER — VANCOMYCIN HCL 1500 MG/300ML IV SOLN
1500.0000 mg | INTRAVENOUS | Status: DC
  Filled 2022-11-10: qty 300

## 2022-11-10 MED ORDER — SULFAMETHOXAZOLE-TRIMETHOPRIM 800-160 MG PO TABS
1.0000 | ORAL_TABLET | Freq: Two times a day (BID) | ORAL | Status: DC
  Filled 2022-11-10: qty 1

## 2022-11-10 MED ORDER — DOXYCYCLINE HYCLATE 100 MG PO TABS: 100.0000 mg | ORAL_TABLET | Freq: Two times a day (BID) | ORAL | Status: DC

## 2022-11-10 MED ORDER — SODIUM CHLORIDE 0.9 % IV SOLN
2.0000 g | INTRAVENOUS | Status: DC
  Filled 2022-11-10: qty 20

## 2022-11-10 NOTE — Progress Notes (Addendum)
PT and nursing staff have concerns regarding patient's surgical incision.  Incision is opening, red and warm to the touch.  Dr. Weber Cooks and Dr. Louis Meckel have been made aware.  Consult with hospitalist to be ordered.

## 2022-11-10 NOTE — Progress Notes (Signed)
Patient pressed call button to inform staff he had a BM.  Patient was cleaned and clean brief place on patient.  Patient assisted ( x 2) to recliner and taken to dayroom. RN continues to encourage patient to press call bell before he has BM so bedpan can be used.

## 2022-11-10 NOTE — Consult Note (Addendum)
Initial Consultation Note   Patient: Shane Chambers HUT:[id number] DOB: [birth year] PCP: Caprice Renshaw, MD DOA: 11/05/2022 DOS: the patient was seen and examined on 11/10/2022 Primary service: Gonzella Lex, MD  Referring physician: Dr. Louis Meckel Reason for consult: Amputation complications  Assessment/Plan: Assessment and Plan:  QT prolongation Patient has a history of QTc prolongation with last QTc of 497.  Given he is on multiple agents that would affect QT including the initiation of Bactrim, will obtain an EKG.  - EKG pending  Cellulitis Patient is presenting with 3-day history of gradually worsening right stump pain and redness with evaluation consistent with cellulitis.  There are some areas where the prior BKA incision seems to be breaking down, but there is no evidence of purulent drainage.  I have discussed with vascular surgery, will come to evaluate.  Will also obtain lab work and start broad-spectrum antibiotics.  Initially plan to use doxycycline and amoxicillin, however patient has a penicillin allergy.   - Vascular surgery consulted; appreciate their recommendations - Start Bactrim DS twice daily - CBC, CMP, ESR and CRP pending - If inflammatory markers are elevated, will consider imaging to rule out deeper infection - Tylenol as needed for fever - If patient should develop fever, please obtain blood cultures  ADDENDUM:  Per vascular surgery's note, a probe was able to be placed down to the bone with 200 cc of purulent drainage.  This is concerning concerning for underlying osteomyelitis.  Will switch Bactrim to Rocephin and vancomycin   TRH will continue to follow the patient.  HPI: Shane Chambers is a 62 y.o. male with past medical history of CAD s/p CABG, HFrEF with recovered EF, PVD, type 2 diabetes, COPD, hypertension, hyperlipidemia, hypothyroidism, depression who is current he admitted for suicidal ideation and found to have complication of his amputation  site.  [name] states that for the last 3 days, he has been experiencing increasing pain of his right BKA stump in addition to increasing erythema.  His nurse at bedside states that he has been picking at it.  Patient has not noticed any purulent drainage at this time but there are areas where he is worried that the incision is opening.  He denies any fever, chills, nausea, vomiting, diarrhea.  Per chart review, patient was admitted on 11/23 for cellulitis of the right lower extremity extending from the foot up towards the knee.  CT imaging at that time demonstrated holdable complex fluid collections and possible medial malleolus osteomyelitis.  MRI was subsequently obtained that demonstrated right ankle septic arthritis.  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  He followed up with orthopedic surgery on 12/14.  At that time, he was instructed to follow-up in 1 week for staple removal.  Review of Systems: As mentioned in the history of present illness. All other systems reviewed and are negative. Past Medical History:  Diagnosis Date   Anemia    Arthritis    CAD (coronary artery disease)    a. s/p CABG in 03/2020 with LIMA-LAD, RIMA-PL, [id number]-OM1   Cancer ([place])    rectal   Cellulitis    COPD (chronic obstructive pulmonary disease) ([place])    Depression    Diabetes mellitus without complication (HCC)    GERD (gastroesophageal reflux disease)    History of kidney stones    Hyperlipidemia 12/01/2019   Hypertension    Hypothyroidism    Ischemic cardiomyopathy    a. EF 20-25% by echo in 02/2020 b. at 40% by echo in  03/2020 c. EF normalized to 60-65% by echo in 04/2020   Myocardial infarction [facility])    Peripheral vascular disease ([place])    Sleep apnea    Type 2 diabetes mellitus [facility])    Past Surgical History:  Procedure Laterality Date   AMPUTATION Right 10/05/2022   Procedure: AMPUTATION BELOW KNEE;  Surgeon: Newt Minion, MD;  Location: [place];  Service: Orthopedics;   Laterality: Right;   APPLICATION OF WOUND VAC Right 10/05/2022   Procedure: APPLICATION OF WOUND VAC;  Surgeon: Newt Minion, MD;  Location: [place];  Service: Orthopedics;  Laterality: Right;   BIOPSY  07/18/2021   Procedure: BIOPSY;  Surgeon: Eloise Harman, DO;  [facility];  Service: Endoscopy;;   BIOPSY  01/09/2022   Procedure: BIOPSY;  Surgeon: Eloise Harman, DO;  [facility];  Service: Endoscopy;;   COLONOSCOPY WITH PROPOFOL N/A 07/18/2021   Carver: 15 millimeter polyp removed from the sigmoid colon, 5 mm polyp removed from sigmoid colon.  Nonbleeding internal hemorrhoids.  Significant looping of the colon. sigmoid path showed invasive colonic adenocarcinoma involving tubular adenoma (invades to depth of 52m, carcinoma 13mfrom margin, no lymphovascular invasion, no poorly differentiated component.   CORONARY ARTERY BYPASS GRAFT N/A 03/13/2020   Procedure: CORONARY ARTERY BYPASS GRAFTING (CABG) times five using bilateral Internal mammary arteries and left radial artery;  Surgeon: AtWonda OldsMD;  [place] [place] Service: Open Heart Surgery;  Laterality: N/A;   DENTAL SURGERY     ESOPHAGOGASTRODUODENOSCOPY (EGD) WITH PROPOFOL N/A 07/18/2021   Carver: 1 gastric polyp status post biopsy, gastritis. gastric bx with slight chronic inflammation and no H.pyori. GEJ polypectomy with mild inflammation only   FLEXIBLE SIGMOIDOSCOPY N/A 08/26/2021   Carver: Nonbleeding internal hemorrhoids.  15 mm ulcers from previous polypectomy found in the rectum.  No evidence of previous polyp.  Located 5 to 8 cm from anal verge.   FLEXIBLE SIGMOIDOSCOPY N/A 01/09/2022   Procedure: FLEXIBLE SIGMOIDOSCOPY;  Surgeon: CaEloise HarmanDO;  [facility];  Service: Endoscopy;  Laterality: N/A;   POLYPECTOMY  07/18/2021   Procedure: POLYPECTOMY INTESTINAL;  Surgeon: CaEloise HarmanDO;  [facility];  Service: Endoscopy;;   RADIAL ARTERY HARVEST Left  03/13/2020   [place]  Surgeon: AtWonda OldsMD;  [place] Service: Open [facility] Surgery;  Laterality: Left;   RIGHT/LEFT HEART CATH AND CORONARY ANGIOGRAPHY N/A 03/07/2020   Procedure: RIGHT/LEFT HEART CATH AND CORONARY ANGIOGRAPHY;  Surgeon: CoSherren MochaMD;  [facility];  Service: Cardiovascular;  Laterality: N/A;   SUBMUCOSAL LIFTING INJECTION  01/09/2022   Procedure: SUBMUCOSAL LIFTING INJECTION;  Surgeon: CaEloise HarmanDO;  [facility];  Service: Endoscopy;;   SUBMUCOSAL TATTOO INJECTION  01/09/2022   Procedure: SUBMUCOSAL TATTOO INJECTION;  Surgeon: CaEloise HarmanDO;  [facility];  Service: Endoscopy;;   TEE WITHOUT CARDIOVERSION N/A 03/13/2020   Procedure: TRANSESOPHAGEAL ECHOCARDIOGRAM (TEE);  Surgeon: AtWonda OldsMD;  [place] [facility] Service: Open [facility] Surgery;  Laterality: N/A;   Social History:  reports that he quit smoking about 27 years ago. His smoking use included cigarettes. He smoked an average of 1.5 packs per day. He has never used smokeless tobacco. He reports that he does not currently use alcohol. He reports that he does not use drugs.  Allergies  Allergen Reactions   Ace Inhibitors Swelling and Cough    (  Not on MAR at [facility] [place])   Haloperidol And Related     Do NOT give anti-psychotics due to risk of  Torsades and QT prolongation (per Dr. Graciela Husbands request)   Other Itching    [name]   Shellfish Allergy    Penicillins Itching and Rash    Has patient had a PCN reaction causing immediate rash, facial/tongue/throat swelling, SOB or lightheadedness with hypotension: Unknown Has patient had a PCN reaction causing severe rash involving mucus membranes or skin necrosis: Unknown Has patient had a PCN reaction that required hospitalization: Unknown Has patient had a PCN reaction occurring within the last 10 years: Unknown If all of the  above answers are "NO", then may proceed with Cephalosporin use.     Family History  Problem Relation Age of Onset   Hypertension Mother     Prior to Admission medications   Medication Sig Start Date End Date Taking? Authorizing Provider  acetaminophen (TYLENOL) 500 MG tablet Take 1,000 mg by mouth every 8 (eight) hours as needed for fever or moderate pain.    [provider]  albuterol (VENTOLIN HFA) 108 (90 Base) MCG/ACT inhaler Inhale 2 puffs into the lungs every 6 (six) hours as needed for wheezing or shortness of breath. 05/02/22   Johnson, Clanford L, MD  alum & mag hydroxide-simeth (MAALOX/MYLANTA) 200-200-20 MG/5ML suspension Take 30 mLs by mouth every 6 (six) hours as needed for indigestion or heartburn. 10/17/22   Ghimire, Henreitta Leber, MD  Amino Acids-Protein Hydrolys (PRO-STAT AWC PO) Take 30 mLs by mouth 2 (two) times daily.    [provider]  amLODipine (NORVASC) 10 MG tablet Take 1 tablet (10 mg total) by mouth daily. 10/18/22   Ghimire, Henreitta Leber, MD  aspirin 81 MG EC tablet Take 1 tablet (81 mg total) by mouth daily with breakfast. 11/29/20   Roxan Hockey, MD  atorvastatin (LIPITOR) 80 MG tablet Take 80 mg by mouth at bedtime.    [provider]  budesonide-formoterol (SYMBICORT) 160-4.5 MCG/ACT inhaler Inhale 2 puffs into the lungs in the morning and at bedtime. 05/24/20   Rigoberto Noel, MD  Cholecalciferol 125 MCG (5000 UT) TABS Take 1 tablet by mouth once a week. On Mondays    [provider]  clotrimazole-betamethasone (LOTRISONE) cream Apply 1 Application topically 2 (two) times daily.    [provider]  docusate sodium (COLACE) 100 MG capsule Take 100 mg by mouth daily. 09/23/22   [provider]  feeding supplement (ENSURE ENLIVE / ENSURE PLUS) LIQD Take 237 mLs by mouth 3 (three) times daily between meals. 10/17/22   Ghimire, Henreitta Leber, MD  FERROUS GLUCONATE PO Take 324 mg by mouth daily. 09/23/22   [provider]  insulin aspart (NOVOLOG FLEXPEN) 100 UNIT/ML FlexPen 0-15 Units, Subcutaneous, 3 times daily with meals CBG < 70: implement hypoglycemia protocol-call MD CBG 70 - 120: 0 units CBG 121 - 150: 2 units CBG 151 - 200: 3 units CBG 201 - 250: 5 units CBG 251 - 300: 8 units CBG 301 - 350: 11 units CBG 351 - 400: 15 units CBG > 400: 10/17/22   Ghimire, Henreitta Leber, MD  ipratropium-albuterol (DUONEB) 0.5-2.5 (3) MG/3ML SOLN Take 3 mLs by nebulization every 6 (six) hours as needed. 10/17/22   Ghimire, Henreitta Leber, MD  levothyroxine (SYNTHROID) 75 MCG tablet Take 75 mcg by mouth daily before breakfast.    [provider]  loperamide HCl (IMODIUM) 2 MG/15ML solution  Take 4 mg by mouth every 6 (six) hours as needed for diarrhea or loose stools.    [provider]  magnesium oxide (MAG-OX) 400 MG tablet Take 400 mg by mouth daily.    [provider]  metFORMIN (GLUCOPHAGE) 1000 MG tablet Take 1,000 mg by mouth 2 (two) times daily.    [provider]  metoprolol tartrate (LOPRESSOR) 100 MG tablet Take 1 tablet (100 mg total) by mouth 2 (two) times daily. 10/17/22   Ghimire, Henreitta Leber, MD  Multiple Vitamin (MULTIVITAMIN) tablet Take 1 tablet by mouth daily.    [provider]  omeprazole (PRILOSEC) 40 MG capsule Take 40 mg by mouth daily. 01/01/22   [provider]  polyethylene glycol (MIRALAX / GLYCOLAX) 17 g packet Take 17 g by mouth daily as needed for moderate constipation.    [provider]  polyvinyl alcohol (LIQUIFILM TEARS) 1.4 % ophthalmic solution Place 1 drop into both eyes daily.    [provider]  potassium chloride SA (KLOR-CON M) 20 MEQ tablet Take 1 tablet (20 mEq total) by mouth daily. 05/05/22   Johnson, Clanford L, MD  senna-docusate (SENOKOT-S) 8.6-50 MG tablet Take 1 tablet by mouth 2 (two) times daily. 10/17/22   Ghimire, Henreitta Leber, MD  tamsulosin (FLOMAX) 0.4 MG CAPS capsule Take 0.8 mg by mouth daily.     [provider]  thiamine (VITAMIN B-1) 100 MG tablet Take 1 tablet (100 mg total) by mouth daily. 10/18/22   Ghimire, Henreitta Leber, MD  torsemide (DEMADEX) 20 MG tablet Take 2 tablets (40 mg total) by mouth daily. 10/17/22   Jonetta Osgood, MD    Physical Exam: Vitals:   11/09/22 1426 11/09/22 2030 11/10/22 0841 11/10/22 1553  BP: (!) 140/64 (!) 153/78 (!) 147/72 129/73  Pulse: 69 72 66 64  Resp:  18 18   Temp:  98.3 F (36.8 C) 98.3 F (36.8 C) 98.7 F (37.1 C)  TempSrc:  Oral  Oral  SpO2: 97% 95% 98% 94%  Weight:      Height:       Physical Exam Vitals and nursing note reviewed.  Constitutional:      General: He is not in acute distress.    Appearance: He is normal weight. He is not toxic-appearing.  HENT:     Head: Normocephalic and atraumatic.     Mouth/Throat:     Mouth: Mucous membranes are moist.  Eyes:     Conjunctiva/sclera: Conjunctivae normal.     Pupils: Pupils are equal, round, and reactive to light.  Pulmonary:     Effort: Pulmonary effort is normal. No respiratory distress.  Musculoskeletal:     Right Lower Extremity: Right leg is amputated below knee.  Feet:     Right foot:     Skin integrity: Skin breakdown (Surrounding incision), erythema (Surrounding incision in the distal point of the right stump) and warmth present.  Neurological:     General: No focal deficit present.     Mental Status: He is alert and oriented to person, place, and time.        Data Reviewed:  Last CBC on 12/18 with WBC of 9.8, hemoglobin 9.3. Last BMP on 12/18 with creatinine of 1.16 with GFR over 60  Surgical MRSA screen positive on 10/04/2022  Prior blood cultures obtained on 11/16 positive for bacillus, felt to be contaminant.  No other culture data available  Last EKG obtained on 10/20/2022 with sinus rhythm with rate of 76.  Prolonged QT with QTc of 497.  No other ST or T wave changes notable.  There are no new results to review at this time.   Family  Communication: No family at bedside  Primary team communication: Primary team updated via epic chat  Thank you very much for involving [place] in the care of your patient.  Author: Jose Persia, MD 11/10/2022 7:37 PM  For on call review [email or web address].

## 2022-11-10 NOTE — Consult Note (Addendum)
Pharmacy Antibiotic Note  Shane Chambers is a 62 y.o. male admitted on 11/05/2022 with  right below-knee amputation infection .  Pharmacy has been consulted for vancomycin dosing.  Plan: Vascular has seen patient and is discussing planning for surgical intervention sometime this week.  Give vancomycin '2500mg'$  IV x1 followed by vancomycin '2500mg'$  IV every 24 hours Goal AUC 400-550  Est AUC: 529.3 Est Cmax: 42.1 Est Cmin: 11.3 Calculated with SCr 1.16   Height: '5\' 9"'$  (175.3 cm) Weight: 107 kg (236 lb) IBW/kg (Calculated) : 70.7  Temp (24hrs), Avg:98.4 F (36.9 C), Min:98.3 F (36.8 C), Max:98.7 F (37.1 C)  Recent Labs  Lab 11/10/22 1916  WBC 12.7*    Estimated Creatinine Clearance: 82.6 mL/min (by C-G formula based on SCr of 1.16 mg/dL).    Allergies  Allergen Reactions   Ace Inhibitors Swelling and Cough    (Not on MAR at [facility])   Haloperidol And Related     Do NOT give anti-psychotics due to risk of  Torsades and QT prolongation (per Dr. Graciela Husbands request)   Other Itching    [name]   Shellfish Allergy    Penicillins Itching and Rash    Has patient had a PCN reaction causing immediate rash, facial/tongue/throat swelling, SOB or lightheadedness with hypotension: Unknown Has patient had a PCN reaction causing severe rash involving mucus membranes or skin necrosis: Unknown Has patient had a PCN reaction that required hospitalization: Unknown Has patient had a PCN reaction occurring within the last 10 years: Unknown If all of the above answers are "NO", then may proceed with Cephalosporin use.     Antimicrobials this admission: 1/1 vancomycin >>   Microbiology results: N/A  Thank you for allowing pharmacy to be a part of this patient's care.  Darrick Penna 11/10/2022 7:41 PM

## 2022-11-10 NOTE — Assessment & Plan Note (Signed)
-   SSI, moderate - Hold home metformin

## 2022-11-10 NOTE — Progress Notes (Signed)
Patient reports poor sleep last night.  Patient endorses some feelings of anxiety and depression.  Denies SI/HI and contracts for safety in the hospital.  Denies AVH. Patient ate breakfast in the dayroom.  Minimal interaction with peers.  Encouraging patient to use call bell prior to having BM so patient can use bedpan.  Patient insists he will not return to a facility, but can care for himself at home.   Compliant with scheduled medications. 15 min checks in place.  Patient is safe on the unit.

## 2022-11-10 NOTE — Assessment & Plan Note (Addendum)
-   SSI, moderate - Hold home metformin

## 2022-11-10 NOTE — Assessment & Plan Note (Signed)
Initially patient was seen in consultation at [facility], however required transfer to the medical unit due to inability to administer IV antibiotics.  Patient presented with 3 days of gradually worsening right stump erythema and pain.  Initial concern for cellulitis or only, however vascular surgery was able to put a probe down to the bone with 200 cc of purulent drainage, more consistent with osteomyelitis.  Inflammatory markers are elevated.   - Vascular surgery consulted; appreciate their recommendations - Per vascular surgery, will plan for stump revision this week - Continue broad-spectrum coverage with vancomycin, ceftriaxone, and metronidazole given rapid onset of symptoms and high risk for complications - Tylenol as needed for fever - If patient should develop fever, please obtain blood cultures

## 2022-11-10 NOTE — Assessment & Plan Note (Signed)
-   Continue SSRIs and antipsychotics started on recent [facility] admission

## 2022-11-10 NOTE — Group Note (Signed)
LCSW Group Therapy Note  Group Date: 11/10/2022 Start Time: 1300 End Time: 1400   Type of Therapy and Topic:  Group Therapy - Healthy vs Unhealthy Coping Skills  Participation Level:  Did Not Attend   Description of Group The focus of this group was to determine what unhealthy coping techniques typically are used by group members and what healthy coping techniques would be helpful in coping with various problems. Patients were guided in becoming aware of the differences between healthy and unhealthy coping techniques. Patients were asked to identify 2-3 healthy coping skills they would like to learn to use more effectively.  Therapeutic Goals Patients learned that coping is what human beings do all day long to deal with various situations in their lives Patients defined and discussed healthy vs unhealthy coping techniques Patients identified their preferred coping techniques and identified whether these were healthy or unhealthy Patients determined 2-3 healthy coping skills they would like to become more familiar with and use more often. Patients provided support and ideas to each other   Summary of Patient Progress:   Patient did not attend group despite encouraged participation.     Therapeutic Modalities Cognitive Behavioral Therapy Motivational [place], [place] 11/10/2022  2:44 PM

## 2022-11-10 NOTE — H&P (Signed)
History and Physical    Patient: Shane Chambers WPY:[id number] DOB: Jun 28, 1961 DOA: (Not on file) DOS: the patient was seen and examined on 11/10/2022 PCP: Caprice Renshaw, MD  Patient coming from: [facility]  Chief Complaint: Wound infection   HPI: Shane Chambers is a 62 y.o. male with past medical history of CAD s/p CABG, HFrEF with recovered EF, PVD, type 2 diabetes, COPD, hypertension, hyperlipidemia, hypothyroidism, depression who is current he admitted for suicidal ideation and found to have complication of his amputation site.   [name] states that for the last 3 days, he has been experiencing increasing pain of his right BKA stump in addition to increasing erythema.  His nurse at bedside states that he has been picking at it.  Patient has not noticed any purulent drainage at this time but there are areas where he is worried that the incision is opening.  He denies any fever, chills, nausea, vomiting, diarrhea.   Per chart review, patient was admitted on 11/23 for cellulitis of the right lower extremity extending from the foot up towards the knee.  CT imaging at that time demonstrated holdable complex fluid collections and possible medial malleolus osteomyelitis.  MRI was subsequently obtained that demonstrated right ankle septic arthritis.  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  He followed up with orthopedic surgery on 12/14.  At that time, he was instructed to follow-up in 1 week for staple removal.  Review of Systems: As mentioned in the history of present illness. All other systems reviewed and are negative.  Past Medical History:  Diagnosis Date   Anemia    Arthritis    CAD (coronary artery disease)    a. s/p CABG in 03/2020 with LIMA-LAD, RIMA-PL, RA-D1-RI-OM1   Cancer ([place])    rectal   Cellulitis    COPD (chronic obstructive pulmonary disease) ([place])    Depression    Diabetes mellitus without complication (HCC)    GERD (gastroesophageal reflux disease)    History of  kidney stones    Hyperlipidemia 12/01/2019   Hypertension    Hypothyroidism    Ischemic cardiomyopathy    a. EF 20-25% by echo in 02/2020 b. at 40% by echo in 03/2020 c. EF normalized to 60-65% by echo in 04/2020   Myocardial infarction [facility])    Peripheral vascular disease ([place])    Sleep apnea    Type 2 diabetes mellitus ([place])    Past Surgical History:  Procedure Laterality Date   AMPUTATION Right 10/05/2022   Procedure: AMPUTATION BELOW KNEE;  Surgeon: Newt Minion, MD;  Location: [place];  Service: Orthopedics;  Laterality: Right;   APPLICATION OF WOUND VAC Right 10/05/2022   Procedure: APPLICATION OF WOUND VAC;  Surgeon: Newt Minion, MD;  Location: [place];  Service: Orthopedics;  Laterality: Right;   BIOPSY  07/18/2021   Procedure: BIOPSY;  Surgeon: Eloise Harman, DO;  [facility];  Service: Endoscopy;;   BIOPSY  01/09/2022   Procedure: BIOPSY;  Surgeon: Eloise Harman, DO;  [facility];  Service: Endoscopy;;   COLONOSCOPY WITH PROPOFOL N/A 07/18/2021   Carver: 15 millimeter polyp removed from the sigmoid colon, 5 mm polyp removed from sigmoid colon.  Nonbleeding internal hemorrhoids.  Significant looping of the colon. sigmoid path showed invasive colonic adenocarcinoma involving tubular adenoma (invades to depth of 66m, carcinoma 154mfrom margin, no lymphovascular invasion, no poorly differentiated component.   CORONARY ARTERY BYPASS GRAFT N/A 03/13/2020   Procedure: CORONARY ARTERY BYPASS  GRAFTING (CABG) times five using bilateral Internal mammary arteries and left radial artery;  Surgeon: [name], MD;  Location: [place];  Service: Open Heart Surgery;  Laterality: N/A;   DENTAL SURGERY     ESOPHAGOGASTRODUODENOSCOPY (EGD) WITH PROPOFOL N/A 07/18/2021   Carver: 1 gastric polyp status post biopsy, gastritis. gastric bx with slight chronic inflammation and no H.pyori. GEJ polypectomy with mild inflammation only   FLEXIBLE SIGMOIDOSCOPY N/A  08/26/2021   Carver: Nonbleeding internal hemorrhoids.  15 mm ulcers from previous polypectomy found in the rectum.  No evidence of previous polyp.  Located 5 to 8 cm from anal verge.   FLEXIBLE SIGMOIDOSCOPY N/A 01/09/2022   Procedure: FLEXIBLE SIGMOIDOSCOPY;  Surgeon: Eloise Harman, DO;  [facility];  Service: Endoscopy;  Laterality: N/A;   POLYPECTOMY  07/18/2021   Procedure: POLYPECTOMY INTESTINAL;  Surgeon: Eloise Harman, DO;  [facility];  Service: Endoscopy;;   RADIAL ARTERY HARVEST Left 03/13/2020   Procedure: [place],;  Surgeon: [name], MD;  Location: [place];  Service: Open Heart Surgery;  Laterality: Left;   RIGHT/LEFT HEART CATH AND CORONARY ANGIOGRAPHY N/A 03/07/2020   Procedure: RIGHT/LEFT HEART CATH AND CORONARY ANGIOGRAPHY;  Surgeon: Sherren Mocha, MD;  Location: [place] [facility];  Service: Cardiovascular;  Laterality: N/A;   SUBMUCOSAL LIFTING INJECTION  01/09/2022   Procedure: SUBMUCOSAL LIFTING INJECTION;  Surgeon: Eloise Harman, DO;  [facility];  Service: Endoscopy;;   SUBMUCOSAL TATTOO INJECTION  01/09/2022   Procedure: SUBMUCOSAL TATTOO INJECTION;  Surgeon: Eloise Harman, DO;  [facility];  Service: Endoscopy;;   TEE WITHOUT CARDIOVERSION N/A 03/13/2020   Procedure: TRANSESOPHAGEAL ECHOCARDIOGRAM (TEE);  Surgeon: [name], MD;  Location: [place];  Service: Open Heart Surgery;  Laterality: N/A;   Social History:  reports that he quit smoking about 27 years ago. His smoking use included cigarettes. He smoked an average of 1.5 packs per day. He has never used smokeless tobacco. He reports that he does not currently use alcohol. He reports that he does not use drugs.  Allergies  Allergen Reactions   Ace Inhibitors Swelling and Cough    (Not on MAR at [facility])   Haloperidol And Related     Do NOT give anti-psychotics due to risk of  Torsades and QT  prolongation (per Dr. Graciela Husbands request)   Other Itching    [name]   Shellfish Allergy    Penicillins Itching and Rash    Has patient had a PCN reaction causing immediate rash, facial/tongue/throat swelling, SOB or lightheadedness with hypotension: Unknown Has patient had a PCN reaction causing severe rash involving mucus membranes or skin necrosis: Unknown Has patient had a PCN reaction that required hospitalization: Unknown Has patient had a PCN reaction occurring within the last 10 years: Unknown If all of the above answers are "NO", then may proceed with Cephalosporin use.     Family History  Problem Relation Age of Onset   Hypertension Mother     Prior to Admission medications   Medication Sig Start Date End Date Taking? Authorizing Provider  acetaminophen (TYLENOL) 500 MG tablet Take 1,000 mg by mouth every 8 (eight) hours as needed for fever or moderate pain.    [provider]  albuterol (VENTOLIN HFA) 108 (90 Base) MCG/ACT inhaler Inhale 2 puffs into the lungs every 6 (six) hours as needed for wheezing or shortness of breath. 05/02/22  Johnson, Clanford L, MD  alum & mag hydroxide-simeth (MAALOX/MYLANTA) 200-200-20 MG/5ML suspension Take 30 mLs by mouth every 6 (six) hours as needed for indigestion or heartburn. 10/17/22   Ghimire, Henreitta Leber, MD  Amino Acids-Protein Hydrolys (PRO-STAT AWC PO) Take 30 mLs by mouth 2 (two) times daily.    [provider]  amLODipine (NORVASC) 10 MG tablet Take 1 tablet (10 mg total) by mouth daily. 10/18/22   Ghimire, Henreitta Leber, MD  aspirin 81 MG EC tablet Take 1 tablet (81 mg total) by mouth daily with breakfast. 11/29/20   Roxan Hockey, MD  atorvastatin (LIPITOR) 80 MG tablet Take 80 mg by mouth at bedtime.    [provider]  budesonide-formoterol (SYMBICORT) 160-4.5 MCG/ACT inhaler Inhale 2 puffs into the lungs in the morning and at bedtime. 05/24/20   Rigoberto Noel, MD  Cholecalciferol 125 MCG (5000 UT) TABS  Take 1 tablet by mouth once a week. On Mondays    [provider]  clotrimazole-betamethasone (LOTRISONE) cream Apply 1 Application topically 2 (two) times daily.    [provider]  docusate sodium (COLACE) 100 MG capsule Take 100 mg by mouth daily. 09/23/22   [provider]  feeding supplement (ENSURE ENLIVE / ENSURE PLUS) LIQD Take 237 mLs by mouth 3 (three) times daily between meals. 10/17/22   Ghimire, Henreitta Leber, MD  FERROUS GLUCONATE PO Take 324 mg by mouth daily. 09/23/22   [provider]  insulin aspart (NOVOLOG FLEXPEN) 100 UNIT/ML FlexPen 0-15 Units, Subcutaneous, 3 times daily with meals CBG < 70: implement hypoglycemia protocol-call MD CBG 70 - 120: 0 units CBG 121 - 150: 2 units CBG 151 - 200: 3 units CBG 201 - 250: 5 units CBG 251 - 300: 8 units CBG 301 - 350: 11 units CBG 351 - 400: 15 units CBG > 400: 10/17/22   Ghimire, Henreitta Leber, MD  ipratropium-albuterol (DUONEB) 0.5-2.5 (3) MG/3ML SOLN Take 3 mLs by nebulization every 6 (six) hours as needed. 10/17/22   Ghimire, Henreitta Leber, MD  levothyroxine (SYNTHROID) 75 MCG tablet Take 75 mcg by mouth daily before breakfast.    [provider]  loperamide HCl (IMODIUM) 2 MG/15ML solution Take 4 mg by mouth every 6 (six) hours as needed for diarrhea or loose stools.    [provider]  magnesium oxide (MAG-OX) 400 MG tablet Take 400 mg by mouth daily.    [provider]  metFORMIN (GLUCOPHAGE) 1000 MG tablet Take 1,000 mg by mouth 2 (two) times daily.    [provider]  metoprolol tartrate (LOPRESSOR) 100 MG tablet Take 1 tablet (100 mg total) by mouth 2 (two) times daily. 10/17/22   Ghimire, Henreitta Leber, MD  Multiple Vitamin (MULTIVITAMIN) tablet Take 1 tablet by mouth daily.    [provider]  omeprazole (PRILOSEC) 40 MG capsule Take 40 mg by mouth daily. 01/01/22   [provider]  polyethylene glycol (MIRALAX / GLYCOLAX) 17 g packet Take 17 g by  mouth daily as needed for moderate constipation.    [provider]  polyvinyl alcohol (LIQUIFILM TEARS) 1.4 % ophthalmic solution Place 1 drop into both eyes daily.    [provider]  potassium chloride SA (KLOR-CON M) 20 MEQ tablet Take 1 tablet (20 mEq total) by mouth daily. 05/05/22   Johnson, Clanford L, MD  senna-docusate (SENOKOT-S) 8.6-50 MG tablet Take 1 tablet by mouth 2 (two) times daily. 10/17/22   Ghimire, Henreitta Leber, MD  tamsulosin (FLOMAX) 0.4  MG CAPS capsule Take 0.8 mg by mouth daily.    [provider]  thiamine (VITAMIN B-1) 100 MG tablet Take 1 tablet (100 mg total) by mouth daily. 10/18/22   Ghimire, Henreitta Leber, MD  torsemide (DEMADEX) 20 MG tablet Take 2 tablets (40 mg total) by mouth daily. 10/17/22   Jonetta Osgood, MD    Physical Exam: Physical Exam:       Vitals:    11/09/22 1426 11/09/22 2030 11/10/22 0841 11/10/22 1553  BP: (!) 140/64 (!) 153/78 (!) 147/72 129/73  Pulse: 69 72 66 64  Resp:   18 18    Temp:   98.3 F (36.8 C) 98.3 F (36.8 C) 98.7 F (37.1 C)  TempSrc:   Oral   Oral  SpO2: 97% 95% 98% 94%  Weight:          Height:            Physical Exam Vitals and nursing note reviewed.  Constitutional:      General: He is not in acute distress.    Appearance: He is normal weight. He is not toxic-appearing.  HENT:     Head: Normocephalic and atraumatic.     Mouth/Throat:     Mouth: Mucous membranes are moist.  Eyes:     Conjunctiva/sclera: Conjunctivae normal.     Pupils: Pupils are equal, round, and reactive to light.  Pulmonary:     Effort: Pulmonary effort is normal. No respiratory distress.  Musculoskeletal:     Right Lower Extremity: Right leg is amputated below knee.  Feet:     Right foot:     Skin integrity: Skin breakdown (Surrounding incision), erythema (Surrounding incision in the distal point of the right stump) and warmth present.  Neurological:     General: No focal deficit present.     Mental Status:  He is alert and oriented to person, place, and time.       Data Reviewed: Last CBC on 12/18 with WBC of 9.8, hemoglobin 9.3. Last BMP on 12/18 with creatinine of 1.16 with GFR over 60   Surgical MRSA screen positive on 10/04/2022   Prior blood cultures obtained on 11/16 positive for bacillus, felt to be contaminant.  No other culture data available   Last EKG obtained on 10/20/2022 with sinus rhythm with rate of 76.  Prolonged QT with QTc of 497.  No other ST or T wave changes notable.   There are no new results to review at this time.   Assessment and Plan:  * Acute osteomyelitis ([place]) Initially patient was seen in consultation at [facility], however required transfer to the medical unit due to inability to administer IV antibiotics.  Patient presented with 3 days of gradually worsening right stump erythema and pain.  Initial concern for cellulitis or only, however vascular surgery was able to put a probe down to the bone with 200 cc of purulent drainage, more consistent with osteomyelitis.  Inflammatory markers are elevated.   - Vascular surgery consulted; appreciate their recommendations - Per vascular surgery, will plan for stump revision this week - Continue broad-spectrum coverage with vancomycin, ceftriaxone, and metronidazole given rapid onset of symptoms and high risk for complications - Tylenol as needed for fever - If patient should develop fever, please obtain blood cultures  QT prolongation Patient has a history of QTc prolongation with last QTc of 497.  EKG today with improved QTc.   Depression - Continue SSRIs and antipsychotics started on recent [facility] admission   Type 2 diabetes mellitus (HCC) - SSI, moderate - Hold home metformin   Advance Care Planning:   Code Status: Full Code   Consults: Vascular Surgery  Family Communication: No family at bedside  Severity of Illness: The appropriate patient status for this patient is INPATIENT. Inpatient status is judged  to be reasonable and necessary in order to provide the required intensity of service to ensure the patient's safety. The patient's presenting symptoms, physical exam findings, and initial radiographic and laboratory data in the context of their chronic comorbidities is felt to place them at high risk for further clinical deterioration. Furthermore, it is not anticipated that the patient will be medically stable for discharge from the hospital within 2 midnights of admission.   * I certify that at the point of admission it is my clinical judgment that the patient will require inpatient hospital care spanning beyond 2 midnights from the point of admission due to high intensity of service, high risk for further deterioration and high frequency of surveillance required.*  Author: Jose Persia, MD 11/10/2022 11:43 PM  For on call review [email or web address].

## 2022-11-10 NOTE — Progress Notes (Signed)
Physical Therapy Treatment Patient Details Name: Shane Chambers MRN: [id number] DOB: 04-22-1961 Today's Date: 11/10/2022   History of Present Illness Pt is a 62 y.o. male with medical history significant of anemia, coronary artery disease, COPD, depression, diabetes mellitus type 2, GERD, hyperlipidemia, hypertension, hypothyroidism, ischemic cardiomyopathy, peripheral vascular disease, and sleep apnea admitted to the hospital in [place] on the 12/18 after presenting from his living facility with complaints of suicidal thoughts. MD assessment includes: severe recurrent major depression, SOB, and acute on chronic combined systolic and diastolic CHF.  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    PT Comments    Pt was in bed with RN at bedside. Upon arrival. Pt is alert and cooperative and pleasant throughout. Author observed pt's stump with concerns noted. MDs made aware. Hospitalist consulted then vascular. Pt was agreeable to PT session and OOB activity. He was educated on importance of ROM, strengthening, and need for daily OOB activity. He required extensive assistance to safely stand 1 x to RW from elevated bed height. Minimal (~ 3-5 sec) standing time prior to endorsing need to sit. Perform squat pivot to recliner. Pt tolerated sitting in recliner x ~ 3 hours prior to author returning to squat pivot back to bed. Pt will need extensive assistance/ PT going forward to maximize independence. Follow MD recs for post acute DC disposition. Per chart, pt's living conditions are not safe. Will continue to monitor situation throughout admission updating recs appropriately.    Recommendations for follow up therapy are one component of a multi-disciplinary discharge planning process, led by the attending physician.  Recommendations may be updated based on patient status, additional functional criteria and insurance authorization.  Follow Up Recommendations  Follow physician's recommendations for discharge plan and  follow up therapies Can patient physically be transported by private vehicle: No   Assistance Recommended at Discharge Frequent or constant Supervision/Assistance  Patient can return home with the following Two people to help with walking and/or transfers;Direct supervision/assist for medications management;Assist for transportation;Help with stairs or ramp for entrance;A lot of help with bathing/dressing/bathroom   Equipment Recommendations  Other (comment) (Ongoing assessment. If DC home (poor living situation) will need w/c + cushion, lift, hospital bed, RW, and any assistance/HH services available)       Precautions / Restrictions Precautions Precautions: Fall Restrictions Weight Bearing Restrictions: Yes [name] Weight Bearing: Weight bearing as tolerated LUE Weight Bearing: Weight bearing as tolerated RLE Weight Bearing: Non weight bearing LLE Weight Bearing: Weight bearing as tolerated     Mobility  Bed Mobility Overal bed mobility: Needs Assistance Bed Mobility: Supine to Sit Rolling: Min assist Supine to sit: Min assist Sit to supine: Min assist General bed mobility comments: pt was able to roll R/L min assist to perform pericare after BM. Increased time to perform    Transfers Overall transfer level: Needs assistance Equipment used: Rolling walker (2 wheels) Transfers: Sit to/from Stand, Bed to chair/wheelchair/BSC Sit to Stand: Max assist, From elevated surface  Squat pivot transfers: Max assist, From elevated surface  General transfer comment: Pt was able to stand 1 x with bed height very elevated + max assist. unable to safely stand pivot with using RW so author elected to squat pivot pt to/from recliner. Pt tolerated sitting in recliner x >2 hours prior to squat pivot back to bed.    Ambulation/Gait  General Gait Details: unable/unsafe to attempt      Balance Overall balance assessment: Needs assistance Sitting-balance support: Single extremity  supported Sitting balance-Leahy  Scale: Good Sitting balance - Comments: no LOB while seated EOB   Standing balance support: During functional activity, Reliant on assistive device for balance, Bilateral upper extremity supported Standing balance-Leahy Scale: Zero Standing balance comment: constant max assist to maintain standing even fopr very short period of time     Cognition Arousal/Alertness: Awake/alert Behavior During Therapy: WFL for tasks assessed/performed, Flat affect Overall Cognitive Status: Within Functional Limits for tasks assessed   General Comments: Pt was extremely pleasant and cooperative. Most of session spent educating and getting correct medical team involved in care due to concerns of stump infection/cellulitis           General Comments General comments (skin integrity, edema, etc.): lengthy education on positioning, stretching, and ther ex. pt states understanding but will need reinforcement in future sessions. Did not issued HEP handout but pt would greatly benefit form one being given.        PT Goals (current goals can now be found in the care plan section) Acute Rehab PT Goals Patient Stated Goal: none stated Progress towards PT goals: Progressing toward goals    Frequency    Min 2X/week      PT Plan Discharge plan needs to be updated       AM-PAC PT "6 Clicks" Mobility   Outcome Measure  Help needed turning from your back to your side while in a flat bed without using bedrails?: A Lot Help needed moving from lying on your back to sitting on the side of a flat bed without using bedrails?: A Lot Help needed moving to and from a bed to a chair (including a wheelchair)?: A Lot Help needed standing up from a chair using your arms (e.g., wheelchair or bedside chair)?: Total Help needed to walk in hospital room?: Total Help needed climbing 3-5 steps with a railing? : Total 6 Click Score: 9    End of Session   Activity Tolerance: Patient  tolerated treatment well Patient left: in bed;with call bell/phone within reach;with bed alarm set;with nursing/sitter in room Nurse Communication: Mobility status PT Visit Diagnosis: Muscle weakness (generalized) (M62.81)     Time: 3785-8850 PT Time Calculation (min) (ACUTE ONLY): 24 min  Charges:  $Therapeutic Exercise: 8-22 mins $Therapeutic Activity: 8-22 mins                    Julaine Fusi PTA 11/10/22, 5:59 PM

## 2022-11-10 NOTE — Plan of Care (Signed)
  Problem: Nutrition: Goal: Adequate nutrition will be maintained Outcome: Progressing   Problem: Coping: Goal: Level of anxiety will decrease Outcome: Progressing   Problem: Health Behavior/Discharge Planning: Goal: Ability to manage health-related needs will improve Outcome: Not Progressing   Problem: Clinical Measurements: Goal: Will remain free from infection Outcome: Not Progressing   Problem: Activity: Goal: Risk for activity intolerance will decrease Outcome: Not Progressing   Problem: Pain Managment: Goal: General experience of comfort will improve Outcome: Not Progressing

## 2022-11-10 NOTE — Assessment & Plan Note (Signed)
Patient has a history of QTc prolongation with last QTc of 497.  EKG today with improved QTc.

## 2022-11-10 NOTE — Progress Notes (Signed)
Dr. Charleen Kirks and Dr. Trula Slade on unit for patient consult.   New orders received.  Patient's right limb wrapped.  PO antibiotics ordered.  Surgery will likely be needed. Patient has been made aware.

## 2022-11-10 NOTE — Progress Notes (Signed)
[facility] MD Progress Note  11/10/2022 12:14 PM Shane Chambers  MRN:  [id number] Subjective: Shane Chambers was seen on rounds.  Dr. Weber Cooks gave me some information on wound care that comes from [place].  Amezcua been suicidal his whole life.  I increased his Effexor and Abilify yesterday.  He denies any side effects.  States that he slept okay.  He is from [facility] in the [place].  He states he is not going back and I told him that he probably has to go back before he can go anywhere else. Principal Problem: Severe recurrent major depression without psychotic features ([place]) Diagnosis: Principal Problem:   Severe recurrent major depression without psychotic features ([place]) Active Problems:   SOB (shortness of breath)   Hypertension   Type 2 diabetes mellitus (HCC)   Normocytic anemia   Acute on chronic combined systolic and diastolic CHF (congestive heart failure) (HCC)   Ischemic cardiomyopathy   Rectal cancer (HCC)   Hypothyroidism  Total Time spent with patient: 15 minutes  Past Psychiatric History:  62 year old man with a history of multiple medical problems and depression along with Cluster B Traits.. Admitted to the hospital in [place] on the 18th after presenting from his living facility, [facility] with complaints of suicidal thoughts.  Patient had recently been started on Effexor and I believe also Abilify.   Past Medical History:  Past Medical History:  Diagnosis Date   Anemia    Arthritis    CAD (coronary artery disease)    a. s/p CABG in 03/2020 with LIMA-LAD, RIMA-PL, RA-D1-RI-OM1   Cancer ([place])    rectal   Cellulitis    COPD (chronic obstructive pulmonary disease) ([place])    Depression    Diabetes mellitus without complication (HCC)    GERD (gastroesophageal reflux disease)    History of kidney stones    Hyperlipidemia 12/01/2019   Hypertension    Hypothyroidism    Ischemic cardiomyopathy    a. EF 20-25% by echo in 02/2020 b. at 40% by echo in 03/2020 c. EF normalized  to 60-65% by echo in 04/2020   Myocardial infarction [facility])    Peripheral vascular disease ([place])    Sleep apnea    Type 2 diabetes mellitus ([place])     Past Surgical History:  Procedure Laterality Date   AMPUTATION Right 10/05/2022   Procedure: AMPUTATION BELOW KNEE;  Surgeon: Newt Minion, MD;  Location: [place];  Service: Orthopedics;  Laterality: Right;   APPLICATION OF WOUND VAC Right 10/05/2022   Procedure: APPLICATION OF WOUND VAC;  Surgeon: Newt Minion, MD;  Location: [place];  Service: Orthopedics;  Laterality: Right;   BIOPSY  07/18/2021   Procedure: BIOPSY;  Surgeon: Eloise Harman, DO;  [facility];  Service: Endoscopy;;   BIOPSY  01/09/2022   Procedure: BIOPSY;  Surgeon: Eloise Harman, DO;  [facility];  Service: Endoscopy;;   COLONOSCOPY WITH PROPOFOL N/A 07/18/2021   Carver: 15 millimeter polyp removed from the sigmoid colon, 5 mm polyp removed from sigmoid colon.  Nonbleeding internal hemorrhoids.  Significant looping of the colon. sigmoid path showed invasive colonic adenocarcinoma involving tubular adenoma (invades to depth of 85m, carcinoma 18mfrom margin, no lymphovascular invasion, no poorly differentiated component.   CORONARY ARTERY BYPASS GRAFT N/A 03/13/2020   Procedure: CORONARY ARTERY BYPASS GRAFTING (CABG) times five using bilateral Internal mammary arteries and left radial artery;  Surgeon: AtWonda OldsMD;  Location: [facility];  Service: Open Heart  Surgery;  Laterality: N/A;   DENTAL SURGERY     ESOPHAGOGASTRODUODENOSCOPY (EGD) WITH PROPOFOL N/A 07/18/2021   Carver: 1 gastric polyp status post biopsy, gastritis. gastric bx with slight chronic inflammation and no H.pyori. GEJ polypectomy with mild inflammation only   FLEXIBLE SIGMOIDOSCOPY N/A 08/26/2021   Carver: Nonbleeding internal hemorrhoids.  15 mm ulcers from previous polypectomy found in the rectum.  No evidence of previous polyp.  Located 5 to 8 cm from anal verge.   FLEXIBLE  SIGMOIDOSCOPY N/A 01/09/2022   Procedure: FLEXIBLE SIGMOIDOSCOPY;  Surgeon: Eloise Harman, DO;  [facility];  Service: Endoscopy;  Laterality: N/A;   POLYPECTOMY  07/18/2021   Procedure: POLYPECTOMY INTESTINAL;  Surgeon: Eloise Harman, DO;  [facility];  Service: Endoscopy;;   RADIAL ARTERY HARVEST Left 03/13/2020   Procedure: [place],;  Surgeon: [name], MD;  Location: [place];  Service: Open Heart Surgery;  Laterality: Left;   RIGHT/LEFT HEART CATH AND CORONARY ANGIOGRAPHY N/A 03/07/2020   Procedure: RIGHT/LEFT HEART CATH AND CORONARY ANGIOGRAPHY;  Surgeon: Sherren Mocha, MD;  Location: [place] CV LAB;  Service: Cardiovascular;  Laterality: N/A;   SUBMUCOSAL LIFTING INJECTION  01/09/2022   Procedure: SUBMUCOSAL LIFTING INJECTION;  Surgeon: Eloise Harman, DO;  [facility];  Service: Endoscopy;;   SUBMUCOSAL TATTOO INJECTION  01/09/2022   Procedure: SUBMUCOSAL TATTOO INJECTION;  Surgeon: Eloise Harman, DO;  [facility];  Service: Endoscopy;;   TEE WITHOUT CARDIOVERSION N/A 03/13/2020   Procedure: TRANSESOPHAGEAL ECHOCARDIOGRAM (TEE);  Surgeon: [name], MD;  Location: [place];  Service: Open Heart Surgery;  Laterality: N/A;   Family History:  Family History  Problem Relation Age of Onset   Hypertension Mother    Family Psychiatric  History: Unremarkable Social History:  Social History   Substance and Sexual Activity  Alcohol Use Not Currently   Comment: rarely     Social History   Substance and Sexual Activity  Drug Use No    Social History   Socioeconomic History   Marital status: Single    Spouse name: Not on file   Number of children: Not on file   Years of education: Not on file   Highest education level: Not on file  Occupational History   Not on file  Tobacco Use   Smoking status: Former    Packs/day: 1.50    Types: Cigarettes    Quit date: 05/25/1995    Years since  quitting: 27.4   Smokeless tobacco: Never  Vaping Use   Vaping Use: Never used  Substance and Sexual Activity   Alcohol use: Not Currently    Comment: rarely   Drug use: No   Sexual activity: Not on file  Other Topics Concern   Not on file  Social History Narrative   Not on file   Social Determinants of Health   Financial Resource Strain: Not on file  Food Insecurity: No Food Insecurity (11/05/2022)   Hunger Vital Sign    Worried About Running Out of Food in the Last Year: Never true    Ran Out of Food in the Last Year: Never true  Transportation Needs: No Transportation Needs (11/05/2022)   PRAPARE - Hydrologist (Medical): No    Lack of Transportation (Non-Medical): No  Physical Activity: Not on file  Stress: Not on file  Social Connections: Not on file   Additional Social History:  Sleep: Fair  Appetite:  Good  Current Medications: Current Facility-Administered Medications  Medication Dose Route Frequency Provider Last Rate Last Admin   acetaminophen (TYLENOL) tablet 650 mg  650 mg Oral Q6H PRN Clapacs, John T, MD   650 mg at 11/10/22 0143   albuterol (VENTOLIN HFA) 108 (90 Base) MCG/ACT inhaler 2 puff  2 puff Inhalation Q6H PRN Clapacs, John T, MD   2 puff at 11/06/22 1602   alum & mag hydroxide-simeth (MAALOX/MYLANTA) 200-200-20 MG/5ML suspension 30 mL  30 mL Oral Q4H PRN Clapacs, John T, MD       amLODipine (NORVASC) tablet 5 mg  5 mg Oral Daily Clapacs, John T, MD   5 mg at 11/10/22 0858   ARIPiprazole (ABILIFY) tablet 10 mg  10 mg Oral Daily Parks Ranger, DO   10 mg at 11/10/22 0945   aspirin EC tablet 81 mg  81 mg Oral Daily Clapacs, Madie Reno, MD   81 mg at 11/10/22 0858   atorvastatin (LIPITOR) tablet 80 mg  80 mg Oral QHS Clapacs, John T, MD   80 mg at 11/09/22 2057   docusate sodium (COLACE) capsule 100 mg  100 mg Oral BID Clapacs, John T, MD   100 mg at 11/08/22 2127   ferrous gluconate  (FERGON) tablet 324 mg  324 mg Oral Q breakfast Clapacs, John T, MD   324 mg at 11/10/22 0900   fluticasone furoate-vilanterol (BREO ELLIPTA) 100-25 MCG/ACT 1 puff  1 puff Inhalation Daily Clapacs, John T, MD   1 puff at 11/10/22 0900   Gerhardt's butt cream   Topical TID Clapacs, Madie Reno, MD   1 Application at 53/97/67 1048   levothyroxine (SYNTHROID) tablet 75 mcg  75 mcg Oral Q0600 Clapacs, John T, MD   75 mcg at 11/10/22 3419   magnesium hydroxide (MILK OF MAGNESIA) suspension 30 mL  30 mL Oral Daily PRN Clapacs, John T, MD       magnesium oxide (MAG-OX) tablet 400 mg  400 mg Oral Daily Clapacs, John T, MD   400 mg at 11/10/22 0859   metFORMIN (GLUCOPHAGE) tablet 1,000 mg  1,000 mg Oral BID WC Clapacs, John T, MD   1,000 mg at 11/10/22 0858   metoprolol tartrate (LOPRESSOR) tablet 50 mg  50 mg Oral BID Clapacs, John T, MD   50 mg at 11/10/22 0858   pantoprazole (PROTONIX) EC tablet 40 mg  40 mg Oral Daily Clapacs, John T, MD   40 mg at 11/10/22 0859   polyethylene glycol (MIRALAX / GLYCOLAX) packet 17 g  17 g Oral Daily Clapacs, John T, MD   17 g at 11/06/22 3790   potassium chloride SA (KLOR-CON M) CR tablet 20 mEq  20 mEq Oral Daily Clapacs, John T, MD   20 mEq at 11/10/22 0900   senna-docusate (Senokot-S) tablet 1 tablet  1 tablet Oral QHS PRN Clapacs, Madie Reno, MD       tamsulosin (FLOMAX) capsule 0.4 mg  0.4 mg Oral QPC breakfast Clapacs, John T, MD   0.4 mg at 11/10/22 2409   thiamine (VITAMIN B1) tablet 100 mg  100 mg Oral Daily Clapacs, John T, MD   100 mg at 11/10/22 0858   torsemide (DEMADEX) tablet 20 mg  20 mg Oral Daily Clapacs, John T, MD   20 mg at 11/10/22 0902   venlafaxine XR (EFFEXOR-XR) 24 hr capsule 150 mg  150 mg Oral Q breakfast Parks Ranger, DO   150  mg at 11/10/22 4696    Lab Results: No results found for this or any previous visit (from the past 48 hour(s)).  Blood Alcohol level:  Lab Results  Component Value Date   ETH <10 29/52/8413    Metabolic  Disorder Labs: Lab Results  Component Value Date   HGBA1C 6.1 (H) 11/07/2022   MPG 128 11/07/2022   MPG 114.02 09/26/2022   No results found for: "PROLACTIN" Lab Results  Component Value Date   CHOL 147 11/07/2022   TRIG 159 (H) 11/07/2022   HDL 35 (L) 11/07/2022   CHOLHDL 4.2 11/07/2022   VLDL 32 11/07/2022   LDLCALC 80 11/07/2022   LDLCALC 82 08/02/2020    Physical Findings: AIMS:  , ,  ,  ,    CIWA:    COWS:     Musculoskeletal: Strength & Muscle Tone: within normal limits Gait & Station: Wheel chair Patient leans: N/A  Psychiatric Specialty Exam:  Presentation  General Appearance:  Casual  Eye Contact: Good  Speech: Clear and Coherent  Speech Volume: Normal  Handedness: Right   Mood and Affect  Mood: Dysphoric  Affect: Congruent; Appropriate   Thought Process  Thought Processes: Coherent  Descriptions of Associations:Intact  Orientation:Full (Time, Place and Person)  Thought Content:Logical; Perseveration  History of Schizophrenia/Schizoaffective disorder:No  Duration of Psychotic Symptoms:N/A  Hallucinations:No data recorded Ideas of Reference:None  Suicidal Thoughts:No data recorded Homicidal Thoughts:No data recorded  Sensorium  Memory: Immediate Good; Recent Good  Judgment: Fair  Insight: Shallow   Executive Functions  Concentration: Fair  Attention Span: Fair  Recall: [place] of Knowledge: Fair  Language: Fair   Psychomotor Activity  Psychomotor Activity:No data recorded  Assets  Assets: Resilience; Financial Resources/Insurance; Communication Skills   Sleep  Sleep:No data recorded   Physical Exam: Physical Exam Vitals and nursing note reviewed.  Constitutional:      Appearance: Normal appearance. He is normal weight.  Neurological:     General: No focal deficit present.     Mental Status: He is alert and oriented to person, place, and time.  Psychiatric:        Attention and  Perception: Attention and perception normal.        Mood and Affect: Mood is depressed. Affect is inappropriate.        Speech: Speech normal.        Behavior: Behavior normal. Behavior is cooperative.        Thought Content: Thought content normal.        Cognition and Memory: Cognition and memory normal.        Judgment: Judgment is impulsive and inappropriate.    Review of Systems  Constitutional: Negative.   HENT: Negative.    Eyes: Negative.   Respiratory: Negative.    Cardiovascular: Negative.   Gastrointestinal: Negative.   Genitourinary: Negative.   Musculoskeletal: Negative.   Skin: Negative.   Neurological: Negative.   Endo/Heme/Allergies: Negative.   Psychiatric/Behavioral:  Positive for depression.    Blood pressure (!) 147/72, pulse 66, temperature 98.3 F (36.8 C), resp. rate 18, height '5\' 9"'$  (1.753 m), weight 107 kg, SpO2 98 %. Body mass index is 34.85 kg/m.   Treatment Plan Summary: Daily contact with patient to assess and evaluate symptoms and progress in treatment, Medication management, and Plan continue current medications.  Parks Ranger, DO 11/10/2022, 12:14 PM

## 2022-11-10 NOTE — H&P (View-Only) (Signed)
[facility] [place]  Patient name: Shane Chambers MRN: [id number] DOB: 01-05-1961 Sex: male   REQUESTING PROVIDER:    Dr. Charleen Kirks   REASON FOR CONSULT:    Right below-knee amputation infection  HISTORY OF PRESENT ILLNESS:   Shane Chambers is a 62 y.o. male, who is status post right below-knee amputation by Dr. Sharol Given on 10/05/2022.  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  He was brought into the behavioral health unit for suicidal ideations.  He has been picking at his wound.  Staples were removed a few weeks ago.  There has been worsening pain and redness at his amputation site.  He has been started on antibiotics.  Patient has history of coronary artery disease and is status post CABG in 2021.  He suffers from COPD secondary to tobacco abuse in the past.  He is a type II diabetic.  He is medically managed for hypertension.  He takes a statin for hypercholesterolemia.  PAST MEDICAL HISTORY    Past Medical History:  Diagnosis Date   Anemia    Arthritis    CAD (coronary artery disease)    a. s/p CABG in 03/2020 with LIMA-LAD, RIMA-PL, RA-D1-RI-OM1   Cancer ([place])    rectal   Cellulitis    COPD (chronic obstructive pulmonary disease) (HCC)    Depression    Diabetes mellitus without complication (HCC)    GERD (gastroesophageal reflux disease)    History of kidney stones    Hyperlipidemia 12/01/2019   Hypertension    Hypothyroidism    Ischemic cardiomyopathy    a. EF 20-25% by echo in 02/2020 b. at 40% by echo in 03/2020 c. EF normalized to 60-65% by echo in 04/2020   Myocardial infarction ([place])    Peripheral vascular disease ([place])    Sleep apnea    Type 2 diabetes mellitus ([place])      FAMILY HISTORY   Family History  Problem Relation Age of Onset   Hypertension Mother     SOCIAL HISTORY:   Social History   Socioeconomic History   Marital status: Single    Spouse name: Not on file   Number of  children: Not on file   Years of education: Not on file   Highest education level: Not on file  Occupational History   Not on file  Tobacco Use   Smoking status: Former    Packs/day: 1.50    Types: Cigarettes    Quit date: 05/25/1995    Years since quitting: 27.4   Smokeless tobacco: Never  Vaping Use   Vaping Use: Never used  Substance and Sexual Activity   Alcohol use: Not Currently    Comment: rarely   Drug use: No   Sexual activity: Not on file  Other Topics Concern   Not on file  Social History Narrative   Not on file   Social Determinants of Health   Financial Resource Strain: Not on file  Food Insecurity: No Food Insecurity (11/05/2022)   Hunger Vital Sign    Worried About Running Out of Food in the Last Year: Never true    Ran Out of Food in the Last Year: Never true  Transportation Needs: No Transportation Needs (11/05/2022)   PRAPARE - Hydrologist (Medical): No    Lack of Transportation (Non-Medical): No  Physical Activity: Not on file  Stress: Not on file  Social Connections: Not on file  Intimate Partner Violence: Not At Risk (  11/05/2022)   Humiliation, Afraid, Rape, and Kick questionnaire    Fear of Current or Ex-Partner: No    Emotionally Abused: No    Physically Abused: No    Sexually Abused: No    ALLERGIES:    Allergies  Allergen Reactions   Ace Inhibitors Swelling and Cough    (Not on MAR at [facility] and [place])   Haloperidol And Related     Do NOT give anti-psychotics due to risk of  Torsades and QT prolongation (per Dr. Graciela Husbands request)   Other Itching    [name]   Shellfish Allergy    Penicillins Itching and Rash    Has patient had a PCN reaction causing immediate rash, facial/tongue/throat swelling, SOB or lightheadedness with hypotension: Unknown Has patient had a PCN reaction causing severe rash involving mucus membranes or skin necrosis: Unknown Has patient had a PCN reaction  that required hospitalization: Unknown Has patient had a PCN reaction occurring within the last 10 years: Unknown If all of the above answers are "NO", then may proceed with Cephalosporin use.     CURRENT MEDICATIONS:    Current Facility-Administered Medications  Medication Dose Route Frequency Provider Last Rate Last Admin   acetaminophen (TYLENOL) tablet 650 mg  650 mg Oral Q6H PRN Clapacs, John T, MD   650 mg at 11/10/22 0143   albuterol (VENTOLIN HFA) 108 (90 Base) MCG/ACT inhaler 2 puff  2 puff Inhalation Q6H PRN Clapacs, John T, MD   2 puff at 11/06/22 1602   alum & mag hydroxide-simeth (MAALOX/MYLANTA) 200-200-20 MG/5ML suspension 30 mL  30 mL Oral Q4H PRN Clapacs, John T, MD       amLODipine (NORVASC) tablet 5 mg  5 mg Oral Daily Clapacs, John T, MD   5 mg at 11/10/22 0858   ARIPiprazole (ABILIFY) tablet 10 mg  10 mg Oral Daily Parks Ranger, DO   10 mg at 11/10/22 0945   aspirin EC tablet 81 mg  81 mg Oral Daily Clapacs, John T, MD   81 mg at 11/10/22 0858   atorvastatin (LIPITOR) tablet 80 mg  80 mg Oral QHS Clapacs, John T, MD   80 mg at 11/09/22 2057   docusate sodium (COLACE) capsule 100 mg  100 mg Oral BID Clapacs, John T, MD   100 mg at 11/08/22 2127   ferrous gluconate (FERGON) tablet 324 mg  324 mg Oral Q breakfast Clapacs, John T, MD   324 mg at 11/10/22 0900   fluticasone furoate-vilanterol (BREO ELLIPTA) 100-25 MCG/ACT 1 puff  1 puff Inhalation Daily Clapacs, John T, MD   1 puff at 11/10/22 0900   Gerhardt's butt cream   Topical TID Clapacs, Madie Reno, MD   1 Application at 16/10/96 1048   levothyroxine (SYNTHROID) tablet 75 mcg  75 mcg Oral Q0600 Clapacs, John T, MD   75 mcg at 11/10/22 0454   magnesium hydroxide (MILK OF MAGNESIA) suspension 30 mL  30 mL Oral Daily PRN Clapacs, John T, MD       magnesium oxide (MAG-OX) tablet 400 mg  400 mg Oral Daily Clapacs, John T, MD   400 mg at 11/10/22 0859   metFORMIN (GLUCOPHAGE) tablet 1,000 mg  1,000 mg Oral BID WC  Clapacs, John T, MD   1,000 mg at 11/10/22 1735   metoprolol tartrate (LOPRESSOR) tablet 50 mg  50 mg Oral BID Clapacs, Madie Reno, MD   50 mg at 11/10/22 0858   pantoprazole (PROTONIX) EC  tablet 40 mg  40 mg Oral Daily Clapacs, Madie Reno, MD   40 mg at 11/10/22 0859   polyethylene glycol (MIRALAX / GLYCOLAX) packet 17 g  17 g Oral Daily Clapacs, Madie Reno, MD   17 g at 11/06/22 8527   potassium chloride SA (KLOR-CON M) CR tablet 20 mEq  20 mEq Oral Daily Clapacs, John T, MD   20 mEq at 11/10/22 0900   senna-docusate (Senokot-S) tablet 1 tablet  1 tablet Oral QHS PRN Clapacs, Madie Reno, MD       sulfamethoxazole-trimethoprim (BACTRIM DS) 800-160 MG per tablet 1 tablet  1 tablet Oral Q12H Jose Persia, MD       tamsulosin (FLOMAX) capsule 0.4 mg  0.4 mg Oral QPC breakfast Clapacs, John T, MD   0.4 mg at 11/10/22 7824   thiamine (VITAMIN B1) tablet 100 mg  100 mg Oral Daily Clapacs, Madie Reno, MD   100 mg at 11/10/22 0858   torsemide (DEMADEX) tablet 20 mg  20 mg Oral Daily Clapacs, John T, MD   20 mg at 11/10/22 0902   venlafaxine XR (EFFEXOR-XR) 24 hr capsule 150 mg  150 mg Oral Q breakfast Parks Ranger, DO   150 mg at 11/10/22 2353    REVIEW OF SYSTEMS:   '[X]'$  denotes positive finding, '[ ]'$  denotes negative finding Cardiac  Comments:  Chest pain or chest pressure:    Shortness of breath upon exertion:    Short of breath when lying flat:    Irregular heart rhythm:        Vascular    Pain in calf, thigh, or hip brought on by ambulation:    Pain in feet at night that wakes you up from your sleep:     Blood clot in your veins:    Leg swelling:         Pulmonary    Oxygen at home:    Productive cough:     Wheezing:         Neurologic    Sudden weakness in arms or legs:     Sudden numbness in arms or legs:     Sudden onset of difficulty speaking or slurred speech:    Temporary loss of vision in one eye:     Problems with dizziness:         Gastrointestinal    Blood in stool:       Vomited blood:         Genitourinary    Burning when urinating:     Blood in urine:        Psychiatric    Major depression:         Hematologic    Bleeding problems:    Problems with blood clotting too easily:        Skin    Rashes or ulcers:        Constitutional    Fever or chills:     PHYSICAL EXAM:   Vitals:   11/09/22 1426 11/09/22 2030 11/10/22 0841 11/10/22 1553  BP: (!) 140/64 (!) 153/78 (!) 147/72 129/73  Pulse: 69 72 66 64  Resp:  18 18   Temp:  98.3 F (36.8 C) 98.3 F (36.8 C) 98.7 F (37.1 C)  TempSrc:  Oral  Oral  SpO2: 97% 95% 98% 94%  Weight:      Height:        GENERAL: The patient is a well-nourished male, in no acute distress. The vital signs  are documented above. CARDIAC: There is a regular rate and rhythm.  PULMONARY: Nonlabored respirations ABDOMEN: Soft and non-tender  MUSCULOSKELETAL: Cellulitis and drainage on the below-knee amputation NEUROLOGIC: No focal weakness or paresthesias are detected. SKIN: See photo below PSYCHIATRIC: The patient has a normal affect.     STUDIES:   None  ASSESSMENT and PLAN   Infected right below-knee amputation: There was an area of drainage on the medial aspect of the incision.  I probed this with a Q-tip.  It tracked all the way to the bone.  Approximately 200 cc of fluid was evacuated.  I discussed with the patient that he is going to need exploration and will likely need conversion to an above-knee amputation.  In the interim, he should have daily and as needed dressing changes.  We will discuss timing for surgical intervention this week.   Leia Alf, MD, FACS [facility] [phone] Pager [phone]

## 2022-11-10 NOTE — Progress Notes (Signed)
EKG completed

## 2022-11-10 NOTE — Assessment & Plan Note (Addendum)
Patient has a history of QTc prolongation with last QTc of 497.  EKG today with improved QTc.

## 2022-11-10 NOTE — Assessment & Plan Note (Addendum)
Initially patient was seen in consultation at [facility], however required transfer to the medical unit due to inability to administer IV antibiotics.  Patient presented with 3 days of gradually worsening right stump erythema and pain.  Initial concern for cellulitis or only, however vascular surgery was able to put a probe down to the bone with 200 cc of purulent drainage, more consistent with osteomyelitis.  Inflammatory markers are elevated.  - Vascular surgery consulted; appreciate their recommendations - Per vascular surgery, will plan for stump revision this week - Continue broad-spectrum coverage with vancomycin, ceftriaxone, and metronidazole given rapid onset of symptoms and high risk for complications - Tylenol as needed for fever - If patient should develop fever, please obtain blood cultures

## 2022-11-10 NOTE — Consult Note (Addendum)
[facility]ist [facility] [place]  Patient name: Shane Chambers MRN: [id number] DOB: 09-May-1961 Sex: male   REQUESTING PROVIDER:    Dr. Charleen Kirks   REASON FOR CONSULT:    Right below-knee amputation infection  HISTORY OF PRESENT ILLNESS:   Shane Chambers is a 62 y.o. male, who is status post right below-knee amputation by Dr. Sharol Given on 10/05/2022.  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  He was brought into the behavioral health unit for suicidal ideations.  He has been picking at his wound.  Staples were removed a few weeks ago.  There has been worsening pain and redness at his amputation site.  He has been started on antibiotics.  Patient has history of coronary artery disease and is status post CABG in 2021.  He suffers from COPD secondary to tobacco abuse in the past.  He is a type II diabetic.  He is medically managed for hypertension.  He takes a statin for hypercholesterolemia.  PAST MEDICAL HISTORY    Past Medical History:  Diagnosis Date   Anemia    Arthritis    CAD (coronary artery disease)    a. s/p CABG in 03/2020 with LIMA-LAD, RIMA-PL, RA-D1-RI-OM1   Cancer ([place])    rectal   Cellulitis    COPD (chronic obstructive pulmonary disease) (HCC)    Depression    Diabetes mellitus without complication (HCC)    GERD (gastroesophageal reflux disease)    History of kidney stones    Hyperlipidemia 12/01/2019   Hypertension    Hypothyroidism    Ischemic cardiomyopathy    a. EF 20-25% by echo in 02/2020 b. at 40% by echo in 03/2020 c. EF normalized to 60-65% by echo in 04/2020   Myocardial infarction ([place])    Peripheral vascular disease ([place])    Sleep apnea    Type 2 diabetes mellitus ([place])      FAMILY HISTORY   Family History  Problem Relation Age of Onset   Hypertension Mother     SOCIAL HISTORY:   Social History   Socioeconomic History   Marital status: Single    Spouse name: Not on file   Number of  children: Not on file   Years of education: Not on file   Highest education level: Not on file  Occupational History   Not on file  Tobacco Use   Smoking status: Former    Packs/day: 1.50    Types: Cigarettes    Quit date: 05/25/1995    Years since quitting: 27.4   Smokeless tobacco: Never  Vaping Use   Vaping Use: Never used  Substance and Sexual Activity   Alcohol use: Not Currently    Comment: rarely   Drug use: No   Sexual activity: Not on file  Other Topics Concern   Not on file  Social History Narrative   Not on file   Social Determinants of Health   Financial Resource Strain: Not on file  Food Insecurity: No Food Insecurity (11/05/2022)   Hunger Vital Sign    Worried About Running Out of Food in the Last Year: Never true    Ran Out of Food in the Last Year: Never true  Transportation Needs: No Transportation Needs (11/05/2022)   PRAPARE - Hydrologist (Medical): No    Lack of Transportation (Non-Medical): No  Physical Activity: Not on file  Stress: Not on file  Social Connections: Not on file  Intimate Partner Violence: Not At Risk (  11/05/2022)   Humiliation, Afraid, Rape, and Kick questionnaire    Fear of Current or Ex-Partner: No    Emotionally Abused: No    Physically Abused: No    Sexually Abused: No    ALLERGIES:    Allergies  Allergen Reactions   Ace Inhibitors Swelling and Cough    (Not on MAR at [facility] [place])   Haloperidol And Related     Do NOT give anti-psychotics due to risk of  Torsades and QT prolongation (per Dr. Graciela Husbands request)   Other Itching    [name]   Shellfish Allergy    Penicillins Itching and Rash    Has patient had a PCN reaction causing immediate rash, facial/tongue/throat swelling, SOB or lightheadedness with hypotension: Unknown Has patient had a PCN reaction causing severe rash involving mucus membranes or skin necrosis: Unknown Has patient had a PCN reaction  that required hospitalization: Unknown Has patient had a PCN reaction occurring within the last 10 years: Unknown If all of the above answers are "NO", then may proceed with Cephalosporin use.     CURRENT MEDICATIONS:    Current Facility-Administered Medications  Medication Dose Route Frequency Provider Last Rate Last Admin   acetaminophen (TYLENOL) tablet 650 mg  650 mg Oral Q6H PRN Clapacs, John T, MD   650 mg at 11/10/22 0143   albuterol (VENTOLIN HFA) 108 (90 Base) MCG/ACT inhaler 2 puff  2 puff Inhalation Q6H PRN Clapacs, John T, MD   2 puff at 11/06/22 1602   alum & mag hydroxide-simeth (MAALOX/MYLANTA) 200-200-20 MG/5ML suspension 30 mL  30 mL Oral Q4H PRN Clapacs, John T, MD       amLODipine (NORVASC) tablet 5 mg  5 mg Oral Daily Clapacs, John T, MD   5 mg at 11/10/22 0858   ARIPiprazole (ABILIFY) tablet 10 mg  10 mg Oral Daily Parks Ranger, DO   10 mg at 11/10/22 0945   aspirin EC tablet 81 mg  81 mg Oral Daily Clapacs, John T, MD   81 mg at 11/10/22 0858   atorvastatin (LIPITOR) tablet 80 mg  80 mg Oral QHS Clapacs, John T, MD   80 mg at 11/09/22 2057   docusate sodium (COLACE) capsule 100 mg  100 mg Oral BID Clapacs, John T, MD   100 mg at 11/08/22 2127   ferrous gluconate (FERGON) tablet 324 mg  324 mg Oral Q breakfast Clapacs, John T, MD   324 mg at 11/10/22 0900   fluticasone furoate-vilanterol (BREO ELLIPTA) 100-25 MCG/ACT 1 puff  1 puff Inhalation Daily Clapacs, John T, MD   1 puff at 11/10/22 0900   Gerhardt's butt cream   Topical TID Clapacs, Madie Reno, MD   1 Application at 25/36/64 1048   levothyroxine (SYNTHROID) tablet 75 mcg  75 mcg Oral Q0600 Clapacs, John T, MD   75 mcg at 11/10/22 4034   magnesium hydroxide (MILK OF MAGNESIA) suspension 30 mL  30 mL Oral Daily PRN Clapacs, John T, MD       magnesium oxide (MAG-OX) tablet 400 mg  400 mg Oral Daily Clapacs, John T, MD   400 mg at 11/10/22 0859   metFORMIN (GLUCOPHAGE) tablet 1,000 mg  1,000 mg Oral BID WC  Clapacs, John T, MD   1,000 mg at 11/10/22 1735   metoprolol tartrate (LOPRESSOR) tablet 50 mg  50 mg Oral BID Clapacs, Madie Reno, MD   50 mg at 11/10/22 0858   pantoprazole (PROTONIX) EC  tablet 40 mg  40 mg Oral Daily Clapacs, Madie Reno, MD   40 mg at 11/10/22 0859   polyethylene glycol (MIRALAX / GLYCOLAX) packet 17 g  17 g Oral Daily Clapacs, Madie Reno, MD   17 g at 11/06/22 1914   potassium chloride SA (KLOR-CON M) CR tablet 20 mEq  20 mEq Oral Daily Clapacs, John T, MD   20 mEq at 11/10/22 0900   senna-docusate (Senokot-S) tablet 1 tablet  1 tablet Oral QHS PRN Clapacs, Madie Reno, MD       sulfamethoxazole-trimethoprim (BACTRIM DS) 800-160 MG per tablet 1 tablet  1 tablet Oral Q12H Jose Persia, MD       tamsulosin (FLOMAX) capsule 0.4 mg  0.4 mg Oral QPC breakfast Clapacs, John T, MD   0.4 mg at 11/10/22 7829   thiamine (VITAMIN B1) tablet 100 mg  100 mg Oral Daily Clapacs, Madie Reno, MD   100 mg at 11/10/22 0858   torsemide (DEMADEX) tablet 20 mg  20 mg Oral Daily Clapacs, John T, MD   20 mg at 11/10/22 0902   venlafaxine XR (EFFEXOR-XR) 24 hr capsule 150 mg  150 mg Oral Q breakfast Parks Ranger, DO   150 mg at 11/10/22 5621    REVIEW OF SYSTEMS:   '[X]'$  denotes positive finding, '[ ]'$  denotes negative finding Cardiac  Comments:  Chest pain or chest pressure:    Shortness of breath upon exertion:    Short of breath when lying flat:    Irregular heart rhythm:        Vascular    Pain in calf, thigh, or hip brought on by ambulation:    Pain in feet at night that wakes you up from your sleep:     Blood clot in your veins:    Leg swelling:         Pulmonary    Oxygen at home:    Productive cough:     Wheezing:         Neurologic    Sudden weakness in arms or legs:     Sudden numbness in arms or legs:     Sudden onset of difficulty speaking or slurred speech:    Temporary loss of vision in one eye:     Problems with dizziness:         Gastrointestinal    Blood in stool:       Vomited blood:         Genitourinary    Burning when urinating:     Blood in urine:        Psychiatric    Major depression:         Hematologic    Bleeding problems:    Problems with blood clotting too easily:        Skin    Rashes or ulcers:        Constitutional    Fever or chills:     PHYSICAL EXAM:   Vitals:   11/09/22 1426 11/09/22 2030 11/10/22 0841 11/10/22 1553  BP: (!) 140/64 (!) 153/78 (!) 147/72 129/73  Pulse: 69 72 66 64  Resp:  18 18   Temp:  98.3 F (36.8 C) 98.3 F (36.8 C) 98.7 F (37.1 C)  TempSrc:  Oral  Oral  SpO2: 97% 95% 98% 94%  Weight:      Height:        GENERAL: The patient is a well-nourished male, in no acute distress. The vital signs  are documented above. CARDIAC: There is a regular rate and rhythm.  PULMONARY: Nonlabored respirations ABDOMEN: Soft and non-tender  MUSCULOSKELETAL: Cellulitis and drainage on the below-knee amputation NEUROLOGIC: No focal weakness or paresthesias are detected. SKIN: See photo below PSYCHIATRIC: The patient has a normal affect.     STUDIES:   None  ASSESSMENT and PLAN   Infected right below-knee amputation: There was an area of drainage on the medial aspect of the incision.  I probed this with a Q-tip.  It tracked all the way to the bone.  Approximately 200 cc of fluid was evacuated.  I discussed with the patient that he is going to need exploration and will likely need conversion to an above-knee amputation.  In the interim, he should have daily and as needed dressing changes.  We will discuss timing for surgical intervention this week.   Leia Alf, MD, FACS [facility] [phone] Pager [phone]

## 2022-11-10 NOTE — Progress Notes (Signed)
Patient's right leg re-wrapped after bandage became soiled along with bed pad.

## 2022-11-10 NOTE — Progress Notes (Signed)
Patient stated the wrap on his right leg had "come off."    Wrap found by another RN.

## 2022-11-10 NOTE — Progress Notes (Signed)
Patient was cleaned and provided with a clean brief.  With x 2 assist, patient was brought to dayroom in recliner for breakfast.  Patient was taken to dayroom bathroom to have BM.    Patient then taken back to his room, cream applied to bottom and clean brief placed on patient.     Patient's surgical incision on right leg is opening and painful to the patent.   MD made aware.  Doctor has reached out to would care nurse on secure chat.

## 2022-11-11 ENCOUNTER — Inpatient Hospital Stay
Admission: AD | Admit: 2022-11-11 | Discharge: 2022-11-28 | DRG: 463 | Disposition: A | Discharge status: Skilled Nursing Facility | Payer: Medicare Other | Source: Other Acute Inpatient Hospital | Attending: Student | Admitting: Student

## 2022-11-11 DIAGNOSIS — K5900 Constipation, unspecified: Secondary | ICD-10-CM | POA: Diagnosis present

## 2022-11-11 DIAGNOSIS — L03115 Cellulitis of right lower limb: Secondary | ICD-10-CM | POA: Diagnosis present

## 2022-11-11 DIAGNOSIS — N179 Acute kidney failure, unspecified: Secondary | ICD-10-CM | POA: Diagnosis not present

## 2022-11-11 DIAGNOSIS — Z794 Long term (current) use of insulin: Secondary | ICD-10-CM | POA: Diagnosis not present

## 2022-11-11 DIAGNOSIS — E119 Type 2 diabetes mellitus without complications: Secondary | ICD-10-CM

## 2022-11-11 DIAGNOSIS — M8610 Other acute osteomyelitis, unspecified site: Secondary | ICD-10-CM | POA: Diagnosis present

## 2022-11-11 DIAGNOSIS — F419 Anxiety disorder, unspecified: Secondary | ICD-10-CM | POA: Diagnosis present

## 2022-11-11 DIAGNOSIS — F332 Major depressive disorder, recurrent severe without psychotic features: Secondary | ICD-10-CM | POA: Diagnosis not present

## 2022-11-11 DIAGNOSIS — B9562 Methicillin resistant Staphylococcus aureus infection as the cause of diseases classified elsewhere: Secondary | ICD-10-CM | POA: Diagnosis present

## 2022-11-11 DIAGNOSIS — E039 Hypothyroidism, unspecified: Secondary | ICD-10-CM | POA: Diagnosis present

## 2022-11-11 DIAGNOSIS — E559 Vitamin D deficiency, unspecified: Secondary | ICD-10-CM | POA: Diagnosis present

## 2022-11-11 DIAGNOSIS — Y835 Amputation of limb(s) as the cause of abnormal reaction of the patient, or of later complication, without mention of misadventure at the time of the procedure: Secondary | ICD-10-CM | POA: Diagnosis present

## 2022-11-11 DIAGNOSIS — Z7982 Long term (current) use of aspirin: Secondary | ICD-10-CM

## 2022-11-11 DIAGNOSIS — T8740 Infection of amputation stump, unspecified extremity: Secondary | ICD-10-CM

## 2022-11-11 DIAGNOSIS — R197 Diarrhea, unspecified: Secondary | ICD-10-CM | POA: Diagnosis not present

## 2022-11-11 DIAGNOSIS — M8618 Other acute osteomyelitis, other site: Secondary | ICD-10-CM | POA: Diagnosis present

## 2022-11-11 DIAGNOSIS — L02415 Cutaneous abscess of right lower limb: Secondary | ICD-10-CM | POA: Diagnosis present

## 2022-11-11 DIAGNOSIS — J449 Chronic obstructive pulmonary disease, unspecified: Secondary | ICD-10-CM | POA: Diagnosis present

## 2022-11-11 DIAGNOSIS — E7800 Pure hypercholesterolemia, unspecified: Secondary | ICD-10-CM | POA: Diagnosis present

## 2022-11-11 DIAGNOSIS — I2510 Atherosclerotic heart disease of native coronary artery without angina pectoris: Secondary | ICD-10-CM | POA: Diagnosis present

## 2022-11-11 DIAGNOSIS — Z8249 Family history of ischemic heart disease and other diseases of the circulatory system: Secondary | ICD-10-CM

## 2022-11-11 DIAGNOSIS — A4902 Methicillin resistant Staphylococcus aureus infection, unspecified site: Secondary | ICD-10-CM | POA: Diagnosis not present

## 2022-11-11 DIAGNOSIS — I110 Hypertensive heart disease with heart failure: Secondary | ICD-10-CM | POA: Diagnosis present

## 2022-11-11 DIAGNOSIS — L89312 Pressure ulcer of right buttock, stage 2: Secondary | ICD-10-CM | POA: Diagnosis present

## 2022-11-11 DIAGNOSIS — Z79890 Hormone replacement therapy (postmenopausal): Secondary | ICD-10-CM

## 2022-11-11 DIAGNOSIS — Z91013 Allergy to seafood: Secondary | ICD-10-CM

## 2022-11-11 DIAGNOSIS — Z79899 Other long term (current) drug therapy: Secondary | ICD-10-CM

## 2022-11-11 DIAGNOSIS — F333 Major depressive disorder, recurrent, severe with psychotic symptoms: Secondary | ICD-10-CM | POA: Diagnosis not present

## 2022-11-11 DIAGNOSIS — T8743 Infection of amputation stump, right lower extremity: Principal | ICD-10-CM | POA: Diagnosis present

## 2022-11-11 DIAGNOSIS — Z880 Allergy status to penicillin: Secondary | ICD-10-CM

## 2022-11-11 DIAGNOSIS — I252 Old myocardial infarction: Secondary | ICD-10-CM

## 2022-11-11 DIAGNOSIS — E669 Obesity, unspecified: Secondary | ICD-10-CM | POA: Diagnosis present

## 2022-11-11 DIAGNOSIS — Z951 Presence of aortocoronary bypass graft: Secondary | ICD-10-CM | POA: Diagnosis not present

## 2022-11-11 DIAGNOSIS — E1151 Type 2 diabetes mellitus with diabetic peripheral angiopathy without gangrene: Secondary | ICD-10-CM | POA: Diagnosis present

## 2022-11-11 DIAGNOSIS — G4733 Obstructive sleep apnea (adult) (pediatric): Secondary | ICD-10-CM | POA: Diagnosis present

## 2022-11-11 DIAGNOSIS — Z6836 Body mass index (BMI) 36.0-36.9, adult: Secondary | ICD-10-CM

## 2022-11-11 DIAGNOSIS — I5022 Chronic systolic (congestive) heart failure: Secondary | ICD-10-CM | POA: Diagnosis present

## 2022-11-11 DIAGNOSIS — D509 Iron deficiency anemia, unspecified: Secondary | ICD-10-CM | POA: Diagnosis present

## 2022-11-11 DIAGNOSIS — N400 Enlarged prostate without lower urinary tract symptoms: Secondary | ICD-10-CM | POA: Diagnosis present

## 2022-11-11 DIAGNOSIS — J9690 Respiratory failure, unspecified, unspecified whether with hypoxia or hypercapnia: Secondary | ICD-10-CM | POA: Diagnosis not present

## 2022-11-11 DIAGNOSIS — E860 Dehydration: Secondary | ICD-10-CM | POA: Diagnosis present

## 2022-11-11 DIAGNOSIS — Z87891 Personal history of nicotine dependence: Secondary | ICD-10-CM | POA: Diagnosis not present

## 2022-11-11 DIAGNOSIS — E1169 Type 2 diabetes mellitus with other specified complication: Secondary | ICD-10-CM | POA: Diagnosis present

## 2022-11-11 DIAGNOSIS — F32A Depression, unspecified: Secondary | ICD-10-CM | POA: Diagnosis present

## 2022-11-11 DIAGNOSIS — R45851 Suicidal ideations: Secondary | ICD-10-CM | POA: Diagnosis present

## 2022-11-11 DIAGNOSIS — R9431 Abnormal electrocardiogram [ECG] [EKG]: Secondary | ICD-10-CM | POA: Diagnosis not present

## 2022-11-11 DIAGNOSIS — K219 Gastro-esophageal reflux disease without esophagitis: Secondary | ICD-10-CM | POA: Diagnosis present

## 2022-11-11 DIAGNOSIS — Z7984 Long term (current) use of oral hypoglycemic drugs: Secondary | ICD-10-CM

## 2022-11-11 DIAGNOSIS — Z7951 Long term (current) use of inhaled steroids: Secondary | ICD-10-CM

## 2022-11-11 DIAGNOSIS — E11649 Type 2 diabetes mellitus with hypoglycemia without coma: Secondary | ICD-10-CM

## 2022-11-11 DIAGNOSIS — Z888 Allergy status to other drugs, medicaments and biological substances status: Secondary | ICD-10-CM

## 2022-11-11 LAB — COMPREHENSIVE METABOLIC PANEL
ALT: 10 U/L (ref 0–44)
AST: 15 U/L (ref 15–41)
Albumin: 2.9 g/dL — ABNORMAL LOW (ref 3.5–5.0)
Alkaline Phosphatase: 81 U/L (ref 38–126)
Anion gap: 9 (ref 5–15)
BUN: 33 mg/dL — ABNORMAL HIGH (ref 6–20)
CO2: 25 mmol/L (ref 22–32)
Calcium: 8.9 mg/dL (ref 8.9–10.3)
Chloride: 103 mmol/L (ref 98–111)
Creatinine, Ser: 1.03 mg/dL (ref 0.61–1.24)
GFR, Estimated: >60 mL/min (ref >60)
Glucose, Bld: 93 mg/dL (ref 70–99)
Potassium: 4 mmol/L (ref 3.5–5.1)
Sodium: 137 mmol/L (ref 135–145)
Total Bilirubin: 0.8 mg/dL (ref 0.3–1.2)
Total Protein: 6.7 g/dL (ref 6.5–8.1)

## 2022-11-11 LAB — C-REACTIVE PROTEIN: CRP: 4.7 mg/dL — ABNORMAL HIGH (ref <1.0)

## 2022-11-11 LAB — MAGNESIUM: Magnesium: 1.4 mg/dL — ABNORMAL LOW (ref 1.7–2.4)

## 2022-11-11 LAB — CBC
HCT: 26.8 % — ABNORMAL LOW (ref 39.0–52.0)
Hemoglobin: 8.5 g/dL — ABNORMAL LOW (ref 13.0–17.0)
MCH: 28.9 pg (ref 26.0–34.0)
MCHC: 31.7 g/dL (ref 30.0–36.0)
MCV: 91.2 fL (ref 80.0–100.0)
Platelets: 306 10*3/uL (ref 150–400)
RBC: 2.94 MIL/uL — ABNORMAL LOW (ref 4.22–5.81)
RDW: 15.7 % — ABNORMAL HIGH (ref 11.5–15.5)
WBC: 12.7 10*3/uL — ABNORMAL HIGH (ref 4.0–10.5)
nRBC: 0 % (ref 0.0–0.2)

## 2022-11-11 LAB — PHOSPHORUS: Phosphorus: 3.5 mg/dL (ref 2.5–4.6)

## 2022-11-11 LAB — GLUCOSE, CAPILLARY
Glucose-Capillary: 108 mg/dL — ABNORMAL HIGH (ref 70–99)
Glucose-Capillary: 104 mg/dL — ABNORMAL HIGH (ref 70–99)
Glucose-Capillary: 155 mg/dL — ABNORMAL HIGH (ref 70–99)
Glucose-Capillary: 161 mg/dL — ABNORMAL HIGH (ref 70–99)

## 2022-11-11 LAB — VITAMIN D 25 HYDROXY (VIT D DEFICIENCY, FRACTURES): Vit D, 25-Hydroxy: 28.25 ng/mL — ABNORMAL LOW (ref 30–100)

## 2022-11-11 LAB — MRSA NEXT GEN BY PCR, NASAL: MRSA by PCR Next Gen: DETECTED — AB

## 2022-11-11 MED ORDER — METRONIDAZOLE 500 MG/100ML IV SOLN
500.0000 mg | Freq: Two times a day (BID) | INTRAVENOUS | Status: DC
  Filled 2022-11-11: qty 100

## 2022-11-11 MED ORDER — VANCOMYCIN HCL IN DEXTROSE 1-5 GM/200ML-% IV SOLN
1000.0000 mg | INTRAVENOUS | Status: DC
  Administered 2022-11-11: 1000 mg via INTRAVENOUS
  Filled 2022-11-11: qty 200

## 2022-11-11 MED ORDER — TAMSULOSIN HCL 0.4 MG PO CAPS
0.8000 mg | ORAL_CAPSULE | Freq: Every day | ORAL | Status: DC
  Administered 2022-11-11 – 2022-11-28 (×18): 0.8 mg via ORAL
  Filled 2022-11-11 (×18): qty 2

## 2022-11-11 MED ORDER — ENSURE MAX PROTEIN PO LIQD
11.0000 [oz_av] | Freq: Two times a day (BID) | ORAL | Status: DC
  Administered 2022-11-11 (×2): 11 [oz_av] via ORAL

## 2022-11-11 MED ORDER — THIAMINE MONONITRATE 100 MG PO TABS
100.0000 mg | ORAL_TABLET | Freq: Every day | ORAL | Status: DC
  Administered 2022-11-11 – 2022-11-28 (×16): 100 mg via ORAL
  Filled 2022-11-11 (×17): qty 1

## 2022-11-11 MED ORDER — AMLODIPINE BESYLATE 10 MG PO TABS
10.0000 mg | ORAL_TABLET | Freq: Every day | ORAL | Status: DC
  Administered 2022-11-11 – 2022-11-28 (×17): 10 mg via ORAL
  Filled 2022-11-11 (×17): qty 1

## 2022-11-11 MED ORDER — INSULIN ASPART 100 UNIT/ML IJ SOLN
0.0000 [IU] | Freq: Three times a day (TID) | INTRAMUSCULAR | Status: DC
  Administered 2022-11-11 (×2): 3 [IU] via SUBCUTANEOUS
  Administered 2022-11-12 (×3): 2 [IU] via SUBCUTANEOUS
  Administered 2022-11-13: 3 [IU] via SUBCUTANEOUS
  Administered 2022-11-13: 2 [IU] via SUBCUTANEOUS
  Administered 2022-11-13: 3 [IU] via SUBCUTANEOUS
  Administered 2022-11-14: 5 [IU] via SUBCUTANEOUS
  Administered 2022-11-14: 3 [IU] via SUBCUTANEOUS
  Administered 2022-11-15 (×3): 2 [IU] via SUBCUTANEOUS
  Administered 2022-11-15: 3 [IU] via SUBCUTANEOUS
  Administered 2022-11-16 (×2): 2 [IU] via SUBCUTANEOUS
  Administered 2022-11-16: 3 [IU] via SUBCUTANEOUS
  Administered 2022-11-17: 5 [IU] via SUBCUTANEOUS
  Administered 2022-11-17 – 2022-11-18 (×2): 2 [IU] via SUBCUTANEOUS
  Administered 2022-11-18: 3 [IU] via SUBCUTANEOUS
  Administered 2022-11-18 – 2022-11-19 (×2): 2 [IU] via SUBCUTANEOUS
  Administered 2022-11-19: 5 [IU] via SUBCUTANEOUS
  Administered 2022-11-19: 3 [IU] via SUBCUTANEOUS
  Administered 2022-11-19 – 2022-11-20 (×4): 2 [IU] via SUBCUTANEOUS
  Administered 2022-11-20 – 2022-11-21 (×2): 3 [IU] via SUBCUTANEOUS
  Administered 2022-11-21 – 2022-11-22 (×4): 2 [IU] via SUBCUTANEOUS
  Administered 2022-11-22 – 2022-11-24 (×6): 3 [IU] via SUBCUTANEOUS
  Administered 2022-11-24: 2 [IU] via SUBCUTANEOUS
  Administered 2022-11-24 (×2): 3 [IU] via SUBCUTANEOUS
  Administered 2022-11-25: 2 [IU] via SUBCUTANEOUS
  Administered 2022-11-25 – 2022-11-27 (×5): 3 [IU] via SUBCUTANEOUS
  Filled 2022-11-11 (×51): qty 1

## 2022-11-11 MED ORDER — SODIUM CHLORIDE 0.9 % IV SOLN
2.0000 g | INTRAVENOUS | Status: DC
  Administered 2022-11-11 – 2022-11-17 (×7): 2 g via INTRAVENOUS
  Filled 2022-11-11: qty 20
  Filled 2022-11-11: qty 2
  Filled 2022-11-11: qty 20
  Filled 2022-11-11 (×2): qty 2
  Filled 2022-11-11: qty 20
  Filled 2022-11-11: qty 2

## 2022-11-11 MED ORDER — SODIUM CHLORIDE 0.9% FLUSH: 3.0000 mL | Freq: Two times a day (BID) | INTRAVENOUS | Status: DC

## 2022-11-11 MED ORDER — METOPROLOL TARTRATE 50 MG PO TABS
100.0000 mg | ORAL_TABLET | Freq: Two times a day (BID) | ORAL | Status: DC
  Administered 2022-11-11 – 2022-11-12 (×3): 100 mg via ORAL
  Filled 2022-11-11 (×4): qty 2

## 2022-11-11 MED ORDER — INSULIN ASPART 100 UNIT/ML IJ SOLN: 0.0000 [IU] | Freq: Three times a day (TID) | INTRAMUSCULAR | Status: DC

## 2022-11-11 MED ORDER — LEVOTHYROXINE SODIUM 50 MCG PO TABS
75.0000 ug | ORAL_TABLET | Freq: Every day | ORAL | Status: DC
  Administered 2022-11-11 – 2022-11-28 (×17): 75 ug via ORAL
  Filled 2022-11-11 (×17): qty 2

## 2022-11-11 MED ORDER — VENLAFAXINE HCL ER 75 MG PO CP24
150.0000 mg | ORAL_CAPSULE | Freq: Every day | ORAL | Status: DC
  Administered 2022-11-11 – 2022-11-28 (×17): 150 mg via ORAL
  Filled 2022-11-11 (×17): qty 2

## 2022-11-11 MED ORDER — CLOTRIMAZOLE 1 % EX CREA
TOPICAL_CREAM | Freq: Two times a day (BID) | CUTANEOUS | Status: DC
  Filled 2022-11-11: qty 15

## 2022-11-11 MED ORDER — PANTOPRAZOLE SODIUM 40 MG PO TBEC
40.0000 mg | DELAYED_RELEASE_TABLET | Freq: Every day | ORAL | Status: DC
  Administered 2022-11-11 – 2022-11-28 (×18): 40 mg via ORAL
  Filled 2022-11-11 (×18): qty 1

## 2022-11-11 MED ORDER — POLYVINYL ALCOHOL 1.4 % OP SOLN
1.0000 [drp] | Freq: Every day | OPHTHALMIC | Status: DC
  Administered 2022-11-15 – 2022-11-28 (×11): 1 [drp] via OPHTHALMIC
  Filled 2022-11-11 (×2): qty 15

## 2022-11-11 MED ORDER — ASPIRIN 81 MG PO TBEC
81.0000 mg | DELAYED_RELEASE_TABLET | Freq: Every day | ORAL | Status: DC
  Administered 2022-11-11 – 2022-11-28 (×17): 81 mg via ORAL
  Filled 2022-11-11 (×17): qty 1

## 2022-11-11 MED ORDER — TORSEMIDE 20 MG PO TABS
40.0000 mg | ORAL_TABLET | Freq: Every day | ORAL | Status: DC
  Administered 2022-11-11 – 2022-11-21 (×10): 40 mg via ORAL
  Filled 2022-11-11 (×10): qty 2

## 2022-11-11 MED ORDER — FLUTICASONE FUROATE-VILANTEROL 200-25 MCG/ACT IN AEPB
1.0000 | INHALATION_SPRAY | Freq: Every day | RESPIRATORY_TRACT | Status: DC
  Administered 2022-11-11 – 2022-11-28 (×17): 1 via RESPIRATORY_TRACT
  Filled 2022-11-11: qty 28

## 2022-11-11 MED ORDER — ACETAMINOPHEN 650 MG RE SUPP: 650.0000 mg | Freq: Four times a day (QID) | RECTAL | Status: DC | PRN

## 2022-11-11 MED ORDER — METRONIDAZOLE 500 MG/100ML IV SOLN
500.0000 mg | Freq: Two times a day (BID) | INTRAVENOUS | Status: DC
  Administered 2022-11-11 – 2022-11-17 (×14): 500 mg via INTRAVENOUS
  Filled 2022-11-11 (×15): qty 100

## 2022-11-11 MED ORDER — LOPERAMIDE HCL 1 MG/7.5ML PO SUSP: 4.0000 mg | Freq: Four times a day (QID) | ORAL | Status: DC | PRN

## 2022-11-11 MED ORDER — POLYETHYLENE GLYCOL 3350 17 G PO PACK
17.0000 g | PACK | Freq: Every day | ORAL | Status: DC | PRN
  Administered 2022-11-11 – 2022-11-12 (×2): 17 g via ORAL
  Filled 2022-11-11 (×2): qty 1

## 2022-11-11 MED ORDER — ENSURE ENLIVE PO LIQD
237.0000 mL | Freq: Three times a day (TID) | ORAL | Status: DC
  Administered 2022-11-11 – 2022-11-12 (×4): 237 mL via ORAL

## 2022-11-11 MED ORDER — POTASSIUM CHLORIDE CRYS ER 20 MEQ PO TBCR
20.0000 meq | EXTENDED_RELEASE_TABLET | Freq: Every day | ORAL | Status: DC
  Administered 2022-11-11 – 2022-11-28 (×17): 20 meq via ORAL
  Filled 2022-11-11 (×17): qty 1

## 2022-11-11 MED ORDER — MAGNESIUM OXIDE -MG SUPPLEMENT 400 (240 MG) MG PO TABS: 400.0000 mg | ORAL_TABLET | Freq: Every day | ORAL | Status: DC

## 2022-11-11 MED ORDER — ARIPIPRAZOLE 10 MG PO TABS
10.0000 mg | ORAL_TABLET | Freq: Every day | ORAL | Status: DC
  Administered 2022-11-11 – 2022-11-28 (×18): 10 mg via ORAL
  Filled 2022-11-11 (×18): qty 1

## 2022-11-11 MED ORDER — ADULT MULTIVITAMIN W/MINERALS CH
1.0000 | ORAL_TABLET | Freq: Every day | ORAL | Status: DC
  Administered 2022-11-11 – 2022-11-28 (×16): 1 via ORAL
  Filled 2022-11-11 (×17): qty 1

## 2022-11-11 MED ORDER — ALBUTEROL SULFATE (2.5 MG/3ML) 0.083% IN NEBU: 2.5000 mg | INHALATION_SOLUTION | Freq: Four times a day (QID) | RESPIRATORY_TRACT | Status: DC | PRN

## 2022-11-11 MED ORDER — MAGNESIUM SULFATE 2 GM/50ML IV SOLN
2.0000 g | Freq: Once | INTRAVENOUS | Status: AC
  Administered 2022-11-11: 2 g via INTRAVENOUS
  Filled 2022-11-11: qty 50

## 2022-11-11 MED ORDER — VANCOMYCIN HCL IN DEXTROSE 1-5 GM/200ML-% IV SOLN
1000.0000 mg | Freq: Two times a day (BID) | INTRAVENOUS | Status: DC
  Administered 2022-11-11 – 2022-11-12 (×2): 1000 mg via INTRAVENOUS
  Filled 2022-11-11 (×4): qty 200

## 2022-11-11 MED ORDER — MAGNESIUM OXIDE -MG SUPPLEMENT 400 (240 MG) MG PO TABS
400.0000 mg | ORAL_TABLET | Freq: Every day | ORAL | Status: DC
  Administered 2022-11-12 – 2022-11-28 (×16): 400 mg via ORAL
  Filled 2022-11-11 (×16): qty 1

## 2022-11-11 MED ORDER — ACETAMINOPHEN 325 MG PO TABS
650.0000 mg | ORAL_TABLET | Freq: Four times a day (QID) | ORAL | Status: DC | PRN
  Filled 2022-11-11: qty 2

## 2022-11-11 MED ORDER — ACETAMINOPHEN 500 MG PO TABS
1000.0000 mg | ORAL_TABLET | Freq: Three times a day (TID) | ORAL | Status: DC | PRN
  Administered 2022-11-11 – 2022-11-19 (×6): 1000 mg via ORAL
  Filled 2022-11-11 (×6): qty 2

## 2022-11-11 MED ORDER — ENOXAPARIN SODIUM 60 MG/0.6ML IJ SOSY
0.5000 mg/kg | PREFILLED_SYRINGE | INTRAMUSCULAR | Status: DC
  Administered 2022-11-11 – 2022-11-15 (×4): 52.5 mg via SUBCUTANEOUS
  Filled 2022-11-11 (×5): qty 0.6

## 2022-11-11 MED ORDER — VANCOMYCIN HCL 1500 MG/300ML IV SOLN
1500.0000 mg | INTRAVENOUS | Status: DC
  Filled 2022-11-11: qty 300

## 2022-11-11 MED ORDER — SODIUM CHLORIDE 0.9% FLUSH
3.0000 mL | Freq: Two times a day (BID) | INTRAVENOUS | Status: DC
  Administered 2022-11-11 – 2022-11-28 (×32): 3 mL via INTRAVENOUS

## 2022-11-11 MED ORDER — SODIUM CHLORIDE 0.9 % IV SOLN: 2.0000 g | INTRAVENOUS | Status: DC

## 2022-11-11 MED ORDER — SENNOSIDES-DOCUSATE SODIUM 8.6-50 MG PO TABS
1.0000 | ORAL_TABLET | Freq: Two times a day (BID) | ORAL | Status: DC
  Administered 2022-11-11 – 2022-11-13 (×5): 1 via ORAL
  Filled 2022-11-11 (×5): qty 1

## 2022-11-11 MED ORDER — ENOXAPARIN SODIUM 60 MG/0.6ML IJ SOSY
0.5000 mg/kg | PREFILLED_SYRINGE | INTRAMUSCULAR | Status: DC
  Filled 2022-11-11: qty 0.6

## 2022-11-11 MED ORDER — ATORVASTATIN CALCIUM 20 MG PO TABS
80.0000 mg | ORAL_TABLET | Freq: Every day | ORAL | Status: DC
  Administered 2022-11-11 – 2022-11-23 (×13): 80 mg via ORAL
  Filled 2022-11-11 (×13): qty 4

## 2022-11-11 MED ORDER — FERROUS GLUCONATE 324 (38 FE) MG PO TABS
324.0000 mg | ORAL_TABLET | Freq: Every day | ORAL | Status: DC
  Administered 2022-11-11 – 2022-11-28 (×17): 324 mg via ORAL
  Filled 2022-11-11 (×18): qty 1

## 2022-11-11 MED ORDER — IPRATROPIUM-ALBUTEROL 0.5-2.5 (3) MG/3ML IN SOLN
3.0000 mL | RESPIRATORY_TRACT | Status: DC | PRN
  Administered 2022-11-25 – 2022-11-28 (×3): 3 mL via RESPIRATORY_TRACT
  Filled 2022-11-11 (×2): qty 3

## 2022-11-11 MED ORDER — DOCUSATE SODIUM 100 MG PO CAPS
100.0000 mg | ORAL_CAPSULE | Freq: Every day | ORAL | Status: DC
  Administered 2022-11-11: 100 mg via ORAL
  Filled 2022-11-11: qty 1

## 2022-11-11 MED ORDER — VITAMIN C 500 MG PO TABS
500.0000 mg | ORAL_TABLET | Freq: Every day | ORAL | Status: DC
  Administered 2022-11-11 – 2022-11-12 (×2): 500 mg via ORAL
  Filled 2022-11-11 (×2): qty 1

## 2022-11-11 MED ORDER — OXYCODONE HCL 5 MG PO TABS
5.0000 mg | ORAL_TABLET | Freq: Four times a day (QID) | ORAL | Status: DC | PRN
  Administered 2022-11-11 – 2022-11-13 (×5): 5 mg via ORAL
  Filled 2022-11-11 (×5): qty 1

## 2022-11-11 MED ORDER — POLYETHYLENE GLYCOL 3350 17 G PO PACK: 17.0000 g | PACK | Freq: Every day | ORAL | Status: DC | PRN

## 2022-11-11 NOTE — Inpatient Diabetes Management (Signed)
Inpatient Diabetes Program Recommendations  AACE/ADA: New Consensus Statement on Inpatient Glycemic Control   Target Ranges:  Prepandial:   less than 140 mg/dL      Peak postprandial:   less than 180 mg/dL (1-2 hours)      Critically ill patients:  140 - 180 mg/dL    Latest Reference Range & Units 11/11/22 06:16  Glucose 70 - 99 mg/dL 93    Latest Reference Range & Units 11/04/22 07:28 11/05/22 22:04 11/06/22 16:04  Glucose-Capillary 70 - 99 mg/dL 112 (H) 146 (H) 154 (H)   Review of Glycemic Control  Diabetes history: DM2 Outpatient Diabetes medications: Metformin 1000 mg BID, Novolog 0-15 units TID (per correction scale) Current orders for Inpatient glycemic control: Novolog 0-15 units AC&HS  Inpatient Diabetes Program Recommendations:    Insulin: Agree with current insulin orders.  NOTE: Noted consult for diabetes coordinator. Patient presented to ED on 10/27/22 from [facility] in [place] with reports of SI.  Per chart, patient was admitted to inpatient behavioral unit on 11/06/22 and transferred to inpatient medical units on 11/10/22 due to acute osteomyelitis of right stump. Patient is currently ordered Novolog correction scale and lab glucose this morning 93 mg/dl. Agree with insulin orders at this time.   Thanks, Barnie Alderman, RN, MSN, [place] Diabetes Coordinator Inpatient Diabetes Program [phone] (Team Pager from 8am to [place])

## 2022-11-11 NOTE — NC FL2 (Signed)
[place] LEVEL OF CARE FORM     IDENTIFICATION  Patient Name: Shane Chambers Birthdate: [birth year] Sex: male Admission Date (Current Location): 11/11/2022  [facility] and [place] Number:  Engineering geologist and Address:         Provider Number: [id number]  Attending Physician Name and Address:  Val Riles, MD  Relative Name and Phone Number:       Current Level of Care: Hospital Recommended Level of Care: [place] Prior Approval Number:    Date Approved/Denied:   PASRR Number: [id number]  Discharge Plan: SNF    Current Diagnoses: Patient Active Problem List   Diagnosis Date Noted   QT prolongation 11/10/2022   Severe recurrent major depression without psychotic features ([place]) 11/05/2022   Major depressive disorder with psychotic features ([place]) 11/02/2022   Acute hypoactive delirium due to another medical condition 10/13/2022   Septic arthritis of right ankle ([place]) 10/04/2022   Acute osteomyelitis ([employer]) 10/02/2022   Subacute osteomyelitis, right ankle and foot ([place]) 10/02/2022   Hypothyroidism 10/02/2022   Hypoalbuminemia 10/02/2022   Cellulitis of right lower extremity 09/25/2022   Lactic acidosis 04/23/2022   Anemia of chronic disease 04/23/2022   Obesity, Class III, BMI 40-49.9 (morbid obesity) ([place]) 04/23/2022   AKI (acute kidney injury) ([place]) 04/22/2022   Elevated troponin 04/22/2022   Cancer of sigmoid colon ([place]) 12/16/2021   Constipation 12/16/2021   GERD (gastroesophageal reflux disease) 12/16/2021   Iron deficiency anemia 09/17/2021   Rectal cancer ([place]) 09/03/2021   Bilateral lower leg cellulitis 62/13/0865   Systolic and diastolic CHF, chronic ([place]) 11/22/2020   CHF exacerbation ([place]) 08/01/2020   Visit for wound check 05/28/2020   OSA (obstructive sleep apnea) 05/24/2020   Acute exacerbation of chronic obstructive pulmonary disease (COPD) ([place]) 04/29/2020   Leukocytosis 04/29/2020   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    Ischemic cardiomyopathy    Coronary artery disease    Acute on chronic combined systolic and diastolic CHF (congestive heart failure) ([place]) 03/05/2020   Acute hyponatremia 03/05/2020   Severe Vitamin D deficiency 12/02/2019   Hypokalemia 12/01/2019   Hyperlipidemia 12/01/2019   BPH (benign prostatic hyperplasia) 12/01/2019   Normocytic anemia 12/01/2019   Pneumonia due to COVID-19 virus 12/01/2019   Hypoxia    SOB (shortness of breath) 11/16/2019   Severe sepsis ([place]) 11/16/2019   Hypertension    Type 2 diabetes mellitus ([place])    Depression    Acute bronchitis with bronchospasm 11/15/2019    Orientation RESPIRATION BLADDER Height & Weight     Self, Time, Situation, Place  Normal Continent Weight:   Height:     BEHAVIORAL SYMPTOMS/MOOD NEUROLOGICAL BOWEL NUTRITION STATUS      Continent Diet (Heart healthy Carb modified)  AMBULATORY STATUS COMMUNICATION OF NEEDS Skin   Extensive Assist Verbally Surgical wounds, Skin abrasions, PU Stage and Appropriate Care                       Personal Care Assistance Level of Assistance              Functional Limitations Info             SPECIAL CARE FACTORS FREQUENCY                       Contractures Contractures Info: Not present    Additional Factors Info  Code Status, Allergies Code Status Info: full Allergies Info: Ace Inhibitors, Haloperidol And  Related, Other, Shellfish Allergy, Penicillins           Current Medications (11/11/2022):  This is the current hospital active medication list Current Facility-Administered Medications  Medication Dose Route Frequency Provider Last Rate Last Admin   acetaminophen (TYLENOL) tablet 1,000 mg  1,000 mg Oral Q8H PRN Sharion Settler, NP   1,000 mg at 11/11/22 0624   albuterol (PROVENTIL) (2.5 MG/3ML) 0.083% nebulizer solution 2.5 mg  2.5 mg Inhalation Q6H PRN Sharion Settler, NP       amLODipine (NORVASC) tablet 10 mg  10 mg Oral Daily Sharion Settler, NP   10 mg  at 11/11/22 0926   ARIPiprazole (ABILIFY) tablet 10 mg  10 mg Oral Daily Sharion Settler, NP   10 mg at 11/11/22 6789   ascorbic acid (VITAMIN C) tablet 500 mg  500 mg Oral Daily Val Riles, MD   500 mg at 11/11/22 3810   aspirin EC tablet 81 mg  81 mg Oral Q breakfast Sharion Settler, NP   81 mg at 11/11/22 1751   atorvastatin (LIPITOR) tablet 80 mg  80 mg Oral QHS Sharion Settler, NP       cefTRIAXone (ROCEPHIN) 2 g in sodium chloride 0.9 % 100 mL IVPB  2 g Intravenous Q24H Sharion Settler, NP 200 mL/hr at 11/11/22 0621 2 g at 11/11/22 0258   clotrimazole (LOTRIMIN) 1 % cream   Topical BID Sharion Settler, NP       docusate sodium (COLACE) capsule 100 mg  100 mg Oral Daily Sharion Settler, NP   100 mg at 11/11/22 0926   enoxaparin (LOVENOX) injection 52.5 mg  0.5 mg/kg Subcutaneous Q24H Sharion Settler, NP   52.5 mg at 11/11/22 0925   feeding supplement (ENSURE ENLIVE / ENSURE PLUS) liquid 237 mL  237 mL Oral TID BM Sharion Settler, NP   237 mL at 11/11/22 1330   ferrous gluconate (FERGON) tablet 324 mg  324 mg Oral Daily Sharion Settler, NP   324 mg at 11/11/22 0927   fluticasone furoate-vilanterol (BREO ELLIPTA) 200-25 MCG/ACT 1 puff  1 puff Inhalation Daily Sharion Settler, NP   1 puff at 11/11/22 0927   insulin aspart (novoLOG) injection 0-15 Units  0-15 Units Subcutaneous TID AC & HS Sharion Settler, NP       ipratropium-albuterol (DUONEB) 0.5-2.5 (3) MG/3ML nebulizer solution 3 mL  3 mL Nebulization Q4H PRN Sharion Settler, NP       levothyroxine (SYNTHROID) tablet 75 mcg  75 mcg Oral Q0600 Sharion Settler, NP   75 mcg at 11/11/22 5277   loperamide HCl (IMODIUM) 1 MG/7.5ML suspension 4 mg  4 mg Oral Q6H PRN Sharion Settler, NP       Derrill Memo ON 11/12/2022] magnesium oxide (MAG-OX) tablet 400 mg  400 mg Oral Daily Val Riles, MD       metoprolol tartrate (LOPRESSOR) tablet 100 mg  100 mg Oral BID Sharion Settler, NP   100 mg at 11/11/22 0926   metroNIDAZOLE (FLAGYL) IVPB 500  mg  500 mg Intravenous Q12H Sharion Settler, NP 100 mL/hr at 11/11/22 0922 500 mg at 11/11/22 8242   multivitamin with minerals tablet 1 tablet  1 tablet Oral Daily Sharion Settler, NP   1 tablet at 11/11/22 0925   pantoprazole (PROTONIX) EC tablet 40 mg  40 mg Oral Daily Sharion Settler, NP   40 mg at 11/11/22 0926   polyethylene glycol (MIRALAX / GLYCOLAX) packet 17 g  17 g Oral Daily PRN Sharion Settler, NP  17 g at 11/11/22 5400   polyvinyl alcohol (LIQUIFILM TEARS) 1.4 % ophthalmic solution 1 drop  1 drop Both Eyes Daily Sharion Settler, NP       potassium chloride SA (KLOR-CON M) CR tablet 20 mEq  20 mEq Oral Daily Sharion Settler, NP   20 mEq at 11/11/22 0926   protein supplement (ENSURE MAX) liquid  11 oz Oral BID Sharion Settler, NP   11 oz at 11/11/22 8676   senna-docusate (Senokot-S) tablet 1 tablet  1 tablet Oral BID Sharion Settler, NP   1 tablet at 11/11/22 0926   sodium chloride flush (NS) 0.9 % injection 3 mL  3 mL Intravenous Q12H Sharion Settler, NP   3 mL at 11/11/22 0928   tamsulosin (FLOMAX) capsule 0.8 mg  0.8 mg Oral Daily Sharion Settler, NP   0.8 mg at 11/11/22 1950   thiamine (VITAMIN B1) tablet 100 mg  100 mg Oral Daily Sharion Settler, NP   100 mg at 11/11/22 0926   torsemide (DEMADEX) tablet 40 mg  40 mg Oral Daily Sharion Settler, NP   40 mg at 11/11/22 0925   vancomycin (VANCOCIN) IVPB 1000 mg/200 mL premix  1,000 mg Intravenous Q12H Dallie Piles, RPH       venlafaxine XR (EFFEXOR-XR) 24 hr capsule 150 mg  150 mg Oral Q breakfast Sharion Settler, NP   150 mg at 11/11/22 9326     Discharge Medications: Please see discharge summary for a list of discharge medications.  Relevant Imaging Results:  Relevant Lab Results:   Additional Information SSN: [id number]  Beverly Sessions, RN

## 2022-11-11 NOTE — Progress Notes (Signed)
Patient ID: Shane Chambers, male   DOB: January 03, 1961, 62 y.o.   MRN: [id number]  0240 Pt Aox4, calm, cooperative, discharged from [facility]. Transported to Med Surg. Report given to [facility], RN on Med Surg.

## 2022-11-11 NOTE — Progress Notes (Signed)
[facility] Progress Note  Patient: Shane Chambers    OFH:[id number]  DOA: 11/11/2022     Date of Service: the patient was seen and examined on 11/11/2022  No chief complaint on file.  Brief hospital course:  Shane Chambers is a 62 y.o. male with past medical history of CAD s/p CABG, HFrEF with recovered EF, PVD, type 2 diabetes, COPD, hypertension, hyperlipidemia, hypothyroidism, depression who is current he admitted for suicidal ideation and found to have complication of his amputation site.  Shane Chambers states that for the last 3 days, he has been experiencing increasing pain of his right BKA stump in addition to increasing erythema.  His nurse at bedside states that he has been picking at it. Patient has not noticed any purulent drainage at this time but there are areas where he is worried that the incision is opening. Per chart review, patient was admitted on 11/23 for cellulitis of the right lower extremity extending from the foot up towards the knee.  CT imaging at that time demonstrated holdable complex fluid collections and possible medial malleolus osteomyelitis.  MRI was subsequently obtained that demonstrated right ankle septic arthritis.  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  He followed up with orthopedic surgery on 12/14.  At that time, he was instructed to follow-up in 1 week for staple removal.   Assessment and Plan:  * Acute osteomyelitis () Initially patient was seen in consultation at [facility], however required transfer to the medical unit due to inability to administer IV antibiotics.  Patient presented with 3 days of gradually worsening right stump erythema and pain.  Initial concern for cellulitis or only, however vascular surgery was able to put a probe down to the bone with 200 cc of purulent drainage, more consistent with osteomyelitis.  Inflammatory markers are elevated.   - Vascular surgery consulted; appreciate their recommendations - Per vascular  surgery, will plan for stump revision this week - Continue broad-spectrum coverage with vancomycin, ceftriaxone, and metronidazole given rapid onset of symptoms and high risk for complications - Tylenol as needed for fever - If patient should develop fever, please obtain blood cultures Started oxycodone 5 mg p.o. every 6 hourly as needed   Hypomagnesemia, mag repleted. Monitor and replete as needed.  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  EKG today with improved QTc.    Hypertension, continue amlodipine, Lopressor HLD, continue statin  Depression - Continue SSRIs and antipsychotics started on recent [facility] admission  Continue Abilify and Effexor  Type 2 diabetes mellitus (HCC) - SSI, moderate - Hold home metformin  Hypothyroid, continued Synthroid Constipation, continue Senokot BPH, continue Flomax   There is no height or weight on file to calculate BMI.  Interventions:    Pressure Injury 11/06/22 Buttocks Right Stage 2 -  Partial thickness loss of dermis presenting as a shallow open injury with a red, pink wound bed without slough. no odor. no drainage. no slough. 1cm X 0.3cm (Active)  11/06/22 1800  Location: Buttocks  Location Orientation: Right  Staging: Stage 2 -  Partial thickness loss of dermis presenting as a shallow open injury with a red, pink wound bed without slough.  Wound Description (Comments): no odor. no drainage. no slough. 1cm X 0.3cm  Present on Admission:      Diet: Heart healthy/carb modified DVT Prophylaxis: Subcutaneous Lovenox   Advance goals of care discussion: Full code  Family Communication: family was NOT present at bedside,  at the time of interview.  The pt provided permission to discuss medical plan with the family. Opportunity was given to ask question and all questions were answered satisfactorily.   Disposition:  Pt is from Home, admitted with right BKA infection and depression with SI/HI  ideations under BHU, still has infection, on IV antibiotics, vascular surgery planning for revision of her right BKA, which precludes a safe discharge. Discharge to [facility], when cleared by vascular surgery.  Subjective: No significant events overnight, patient is complaining of pain to the right BKA which is controlled with pain medications.  Patient is also having some constipation but he did move bowels today.  Denies any other active issues.  Physical Exam: General:  alert oriented to time, place, and person.  Appear in no distress, affect appropriate Eyes: PERRLA ENT: Oral Mucosa Clear, moist  Neck: no JVD,  Cardiovascular: S1 and S2 Present, no Murmur,  Respiratory: good respiratory effort, Bilateral Air entry equal and Decreased, no Crackles, no wheezes Abdomen: Bowel Sound present, Soft and no tenderness,  Skin: no rashes Extremities: no Pedal edema, no calf tenderness, s/p Right BKA Neurologic: without any new focal findings Gait not checked due to patient safety concerns  Vitals:   11/11/22 0513 11/11/22 0825  BP: (!) 144/66 139/66  Pulse: 67 64  Resp: 20 16  Temp: 99.4 F (37.4 C) 98.1 F (36.7 C)  TempSrc: Oral Oral  SpO2: 97% 94%    Intake/Output Summary (Last 24 hours) at 11/11/2022 1452 Last data filed at 11/11/2022 1342 Gross per 24 hour  Intake --  Output 550 ml  Net -550 ml   There were no vitals filed for this visit.  Data Reviewed: I have personally reviewed and interpreted daily labs, tele strips, imagings as discussed above. I reviewed all nursing notes, pharmacy notes, vitals, pertinent old records I have discussed plan of care as described above with RN and patient/family.  CBC: Recent Labs  Lab 11/10/22 1916 11/11/22 0616  WBC 12.7* 12.7*  NEUTROABS 9.3*  --   HGB 9.1* 8.5*  HCT 28.5* 26.8*  MCV 90.8 91.2  PLT 303 454   Basic Metabolic Panel: Recent Labs  Lab 11/10/22 1916 11/11/22 0616  NA 136 137  K 4.1 4.0  CL 100 103  CO2 24 25   GLUCOSE 181* 93  BUN 38* 33*  CREATININE 1.21 1.03  CALCIUM 8.9 8.9  MG  --  1.4*  PHOS  --  3.5    Studies: No results found.  Scheduled Meds:  amLODipine  10 mg Oral Daily   ARIPiprazole  10 mg Oral Daily   vitamin C  500 mg Oral Daily   aspirin EC  81 mg Oral Q breakfast   atorvastatin  80 mg Oral QHS   clotrimazole   Topical BID   docusate sodium  100 mg Oral Daily   enoxaparin (LOVENOX) injection  0.5 mg/kg Subcutaneous Q24H   feeding supplement  237 mL Oral TID BM   ferrous gluconate  324 mg Oral Daily   fluticasone furoate-vilanterol  1 puff Inhalation Daily   insulin aspart  0-15 Units Subcutaneous TID AC & HS   levothyroxine  75 mcg Oral Q0600   [START ON 11/12/2022] magnesium oxide  400 mg Oral Daily   metoprolol tartrate  100 mg Oral BID   multivitamin with minerals  1 tablet Oral Daily   pantoprazole  40 mg Oral Daily   polyvinyl alcohol  1 drop Both Eyes Daily  potassium chloride SA  20 mEq Oral Daily   Ensure Max Protein  11 oz Oral BID   senna-docusate  1 tablet Oral BID   sodium chloride flush  3 mL Intravenous Q12H   tamsulosin  0.8 mg Oral Daily   thiamine  100 mg Oral Daily   torsemide  40 mg Oral Daily   venlafaxine XR  150 mg Oral Q breakfast   Continuous Infusions:  cefTRIAXone (ROCEPHIN)  IV 2 g (11/11/22 1610)   metronidazole 500 mg (11/11/22 0922)   vancomycin     PRN Meds: acetaminophen, albuterol, ipratropium-albuterol, loperamide HCl, polyethylene glycol  Time spent: 35 minutes  Author: Val Riles. MD [facility] 11/11/2022 2:52 PM  To reach On-call, see care teams to locate the attending and reach out to them via [email or web address]. If 7PM-7AM, please contact night-coverage If you still have difficulty reaching the attending provider, please page the [facility] (Director on Call) for [facility] on amion for assistance.

## 2022-11-11 NOTE — BHH Suicide Risk Assessment (Signed)
[facility] Discharge Suicide Risk Assessment   Principal Problem: Severe recurrent major depression without psychotic features [facility]) Discharge Diagnoses: Principal Problem:   Severe recurrent major depression without psychotic features ([place]) Active Problems:   SOB (shortness of breath)   Hypertension   Type 2 diabetes mellitus (HCC)   Normocytic anemia   Acute on chronic combined systolic and diastolic CHF (congestive heart failure) (HCC)   Ischemic cardiomyopathy   Rectal cancer (HCC)   Acute osteomyelitis (HCC)   Hypothyroidism   QT prolongation   Total Time spent with patient: 1 hour  Musculoskeletal: Strength & Muscle Tone: within normal limits Gait & Station: unable to stand Patient leans: N/A  Psychiatric Specialty Exam  Presentation  General Appearance:  Casual  Eye Contact: Good  Speech: Clear and Coherent  Speech Volume: Normal  Handedness: Right   Mood and Affect  Mood: Dysphoric  Duration of Depression Symptoms: Greater than two weeks  Affect: Congruent; Appropriate   Thought Process  Thought Processes: Coherent  Descriptions of Associations:Intact  Orientation:Full (Time, Place and Person)  Thought Content:Logical; Perseveration  History of Schizophrenia/Schizoaffective disorder:No  Duration of Psychotic Symptoms:N/A  Hallucinations:No data recorded Ideas of Reference:None  Suicidal Thoughts:No data recorded Homicidal Thoughts:No data recorded  Sensorium  Memory: Immediate Good; Recent Good  Judgment: Fair  Insight: Shallow   Executive Functions  Concentration: Fair  Attention Span: Fair  Recall: [place] of Knowledge: Fair  Language: Fair   Psychomotor Activity  Psychomotor Activity:No data recorded  Assets  Assets: Resilience; Financial Resources/Insurance; Communication Skills   Sleep  Sleep:No data recorded  Physical Exam: Physical Exam: Mood and affect are stable. ROS: Negative Blood  pressure (!) 146/71, pulse 73, temperature 98 F (36.7 C), temperature source Oral, resp. rate 18, height '5\' 9"'$  (1.753 m), weight 107 kg, SpO2 98 %. Body mass index is 34.85 kg/m.  Mental Status Per Nursing Assessment::   On Admission:  Suicidal ideation indicated by patient  Demographic Factors:  Male and Caucasian  Loss Factors: Decline in physical health  Historical Factors: NA  Risk Reduction Factors:   Living with another person, especially a relative and Positive social support  Continued Clinical Symptoms:  Personality Disorders:   Cluster B  Cognitive Features That Contribute To Risk:  None    Suicide Risk:  Minimal: No identifiable suicidal ideation.  Patients presenting with no risk factors but with morbid ruminations; may be classified as minimal risk based on the severity of the depressive symptoms    Plan Of Care/Follow-up recommendations: PCP   [place] Ranger, DO 11/11/2022, 10:18 AM

## 2022-11-11 NOTE — Progress Notes (Signed)
Anticoagulation monitoring(Lovenox):  62 yo male ordered Lovenox 40 mg Q24h    There were no vitals filed for this visit.  BMI  34.85  Lab Results  Component Value Date   CREATININE 1.21 11/10/2022   CREATININE 1.16 10/27/2022   CREATININE 1.17 10/16/2022   Estimated Creatinine Clearance: 79.2 mL/min (by C-G formula based on SCr of 1.21 mg/dL). Hemoglobin & Hematocrit     Component Value Date/Time   HGB 9.1 (L) 11/10/2022 1916   HCT 28.5 (L) 11/10/2022 1916     Per Protocol for Patient with estCrcl > 30 ml/min and BMI > 30, will transition to Lovenox 52.5 mg Q24h.

## 2022-11-11 NOTE — Plan of Care (Signed)
Pt calm, cooperative, motivated to transfer to med surg. Pt denies SI/pain at this time. Pt reported dressing MD had placed fell off of leg. This writer located the gauze underneath of pt. Pt was picking at wound on approach. This writer changed linens, performed peri care, applied medicated cream to buttocks, assisted pt into a hospital gown. Changed dressing, nonstick pads/gauze. Orders received to transfer pt to medical. Awaiting bed per [facility].  0200 Report given to [facility] on Med Surg. Pt informed bed is ready, dressing was no longer on wound pt does not know how it fell off. Pt placed on bed pan, pt could not go at this time, changed brief. Staff transferred pt to wheelchair, transport and MHT walking pt to unit. Pt's medications given to transport.   Problem: Health Behavior/Discharge Planning: Goal: Identification of resources available to assist in meeting health care needs will improve Outcome: Progressing Goal: Compliance with treatment plan for underlying cause of condition will improve Outcome: Progressing
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
## 2022-11-11 NOTE — TOC Initial Note (Addendum)
Transition of Care [facility]) - Initial/Assessment Note    Patient Details  Name: Shane Chambers MRN: [id number] Date of Birth: 16-Nov-1960  Transition of Care [facility]) CM/SW Contact:    Beverly Sessions, RN Phone Number: 11/11/2022, 2:06 PM  Clinical Narrative:                  Admitted for: osteo. Vascular consult. Per Vascular know BKA may have to be revised to AKA Admitted from: inpatient psych. Patient is a LTC resident at [facility]   Per psych patient does not meet inpatient criteria for psych Requested PT OT eval.  Patient states that he does not want to return to [facility]. Patient agreeable to bed search.  Patient states other than [facility] he doesn't have anywhere else to go at discharge. Patient states that he is working with Medical laboratory scientific officer the Person" through [place] to obtain his own housing.  Patient is aware tat if he does not receive any other bed offers he may have to return to [facility] re[facility] and continue working with [employer] [facility] outpatient  Ebony Hail with [facility] checking to see if patient can return at discharge   [place] sent for signature.  Bed search initiated         Patient Goals and CMS Choice            Expected Discharge Plan and Services                                              Prior Living Arrangements/Services                       Activities of Daily Living      Permission Sought/Granted                  Emotional Assessment              Admission diagnosis:  Acute osteomyelitis ([place]) [M86.10] Patient Active Problem List   Diagnosis Date Noted   QT prolongation 11/10/2022   Severe recurrent major depression without psychotic features ([place]) 11/05/2022   Major depressive disorder with psychotic features ([place]) 11/02/2022   Acute hypoactive delirium due to another medical condition 10/13/2022   Septic arthritis of right ankle ([place]) 10/04/2022   Acute osteomyelitis  ([place]) 10/02/2022   Subacute osteomyelitis, right ankle and foot ([place]) 10/02/2022   Hypothyroidism 10/02/2022   Hypoalbuminemia 10/02/2022   Cellulitis of right lower extremity 09/25/2022   Lactic acidosis 04/23/2022   Anemia of chronic disease 04/23/2022   Obesity, Class III, BMI 40-49.9 (morbid obesity) ([place]) 04/23/2022   AKI (acute kidney injury) ([place]) 04/22/2022   Elevated troponin 04/22/2022   Cancer of sigmoid colon ([place]) 12/16/2021   Constipation 12/16/2021   GERD (gastroesophageal reflux disease) 12/16/2021   Iron deficiency anemia 09/17/2021   Rectal cancer ([place]) 09/03/2021   Bilateral lower leg cellulitis 10/62/6948   Systolic and diastolic CHF, chronic ([place]) 11/22/2020   CHF exacerbation ([place]) 08/01/2020   Visit for wound check 05/28/2020   OSA (obstructive sleep apnea) 05/24/2020   Acute exacerbation of chronic obstructive pulmonary disease (COPD) ([place]) 04/29/2020   Leukocytosis 04/29/2020   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   Ischemic cardiomyopathy    Coronary artery disease    Acute on chronic combined systolic and diastolic CHF (congestive heart failure) ([place])  03/05/2020   Acute hyponatremia 03/05/2020   Severe Vitamin D deficiency 12/02/2019   Hypokalemia 12/01/2019   Hyperlipidemia 12/01/2019   BPH (benign prostatic hyperplasia) 12/01/2019   Normocytic anemia 12/01/2019   Pneumonia due to COVID-19 virus 12/01/2019   Hypoxia    SOB (shortness of breath) 11/16/2019   Severe sepsis ([place]) 11/16/2019   Hypertension    Type 2 diabetes mellitus ([place])    Depression    Acute bronchitis with bronchospasm 11/15/2019   PCP:  Caprice Renshaw, MD Pharmacy:   [facility], [place] [address] [place] [address] Phone: [phone] Fax: [phone]     Social Determinants of Health (SDOH) Social History: SDOH Screenings   Food Insecurity: No Food Insecurity (11/05/2022)  Housing: Low Risk  (11/05/2022)  Transportation  Needs: No Transportation Needs (11/05/2022)  Utilities: Not At Risk (11/05/2022)  Alcohol Screen: Low Risk  (11/05/2022)  Tobacco Use: Medium Risk (11/05/2022)   SDOH Interventions:     Readmission Risk Interventions    11/11/2022    2:05 PM 10/03/2022   10:08 AM 09/26/2022    1:16 PM  Readmission Risk Prevention Plan  Transportation Screening Complete Complete Complete  PCP or Specialist Appt within 3-5 Days   Complete  HRI or [place]   Complete  Social Work Consult for [place] Planning/Counseling   Complete  Palliative Care Screening   Not Applicable  Medication Review Press photographer)   Complete  HRI or Home Care Consult Complete Complete   SW Recovery Care/Counseling Consult Complete Complete   Palliative Care Screening Not Applicable Not Applicable   Skilled Nursing Facility Complete Complete

## 2022-11-11 NOTE — Consult Note (Signed)
Pharmacy Antibiotic Note  Shane Chambers is a 62 y.o. male w/ PMH of BPH, DM, CAD, CABG, CHF, HTN, hypothyroidism, depression, BKA admitted on 11/11/2022 with  right below-knee amputation infection .  Pharmacy has been consulted for vancomycin dosing.  Plan: adjust vancomycin 1000 mg IV every 12 hours Goal AUC 400-550  Est AUC: 545.8 Ke 0.068h-1, T1/2 10.1h Calculated with SCr 1.16 mg/dL Follow renal function for needed dose adjustments    Temp (24hrs), Avg:98.6 F (37 C), Min:98 F (36.7 C), Max:99.4 F (37.4 C)  Recent Labs  Lab 11/10/22 1916 11/11/22 0616  WBC 12.7* 12.7*  CREATININE 1.21 1.03     Estimated Creatinine Clearance: 93.1 mL/min (by C-G formula based on SCr of 1.03 mg/dL).    Allergies  Allergen Reactions   Ace Inhibitors Swelling and Cough    (Not on MAR at [facility])   Haloperidol And Related     Do NOT give anti-psychotics due to risk of  Torsades and QT prolongation (per Dr. Graciela Husbands request)   Other Itching    [name]   Shellfish Allergy    Penicillins Itching and Rash    Has patient had a PCN reaction causing immediate rash, facial/tongue/throat swelling, SOB or lightheadedness with hypotension: Unknown Has patient had a PCN reaction causing severe rash involving mucus membranes or skin necrosis: Unknown Has patient had a PCN reaction that required hospitalization: Unknown Has patient had a PCN reaction occurring within the last 10 years: Unknown If all of the above answers are "NO", then may proceed with Cephalosporin use.     Antimicrobials this admission: 1/1 vancomycin >>  1/1 metronidazole >> 1/1 CTX >>  Microbiology results: N/A  Thank you for allowing pharmacy to be a part of this patient's care.  Dallie Piles 11/11/2022 9:17 AM

## 2022-11-12 DIAGNOSIS — M8610 Other acute osteomyelitis, unspecified site: Secondary | ICD-10-CM | POA: Diagnosis not present

## 2022-11-12 LAB — BASIC METABOLIC PANEL
Anion gap: 10 (ref 5–15)
BUN: 35 mg/dL — ABNORMAL HIGH (ref 6–20)
CO2: 24 mmol/L (ref 22–32)
Calcium: 8.7 mg/dL — ABNORMAL LOW (ref 8.9–10.3)
Chloride: 101 mmol/L (ref 98–111)
Creatinine, Ser: 1.15 mg/dL (ref 0.61–1.24)
GFR, Estimated: >60 mL/min (ref >60)
Glucose, Bld: 87 mg/dL (ref 70–99)
Potassium: 4.1 mmol/L (ref 3.5–5.1)
Sodium: 135 mmol/L (ref 135–145)

## 2022-11-12 LAB — CBC
HCT: 26.5 % — ABNORMAL LOW (ref 39.0–52.0)
Hemoglobin: 8.5 g/dL — ABNORMAL LOW (ref 13.0–17.0)
MCH: 28.9 pg (ref 26.0–34.0)
MCHC: 32.1 g/dL (ref 30.0–36.0)
MCV: 90.1 fL (ref 80.0–100.0)
Platelets: 322 10*3/uL (ref 150–400)
RBC: 2.94 MIL/uL — ABNORMAL LOW (ref 4.22–5.81)
RDW: 15.7 % — ABNORMAL HIGH (ref 11.5–15.5)
WBC: 10.8 10*3/uL — ABNORMAL HIGH (ref 4.0–10.5)
nRBC: 0 % (ref 0.0–0.2)

## 2022-11-12 LAB — GLUCOSE, CAPILLARY
Glucose-Capillary: 97 mg/dL (ref 70–99)
Glucose-Capillary: 147 mg/dL — ABNORMAL HIGH (ref 70–99)
Glucose-Capillary: 134 mg/dL — ABNORMAL HIGH (ref 70–99)
Glucose-Capillary: 125 mg/dL — ABNORMAL HIGH (ref 70–99)

## 2022-11-12 LAB — MAGNESIUM: Magnesium: 1.7 mg/dL (ref 1.7–2.4)

## 2022-11-12 LAB — PHOSPHORUS: Phosphorus: 4.3 mg/dL (ref 2.5–4.6)

## 2022-11-12 MED ORDER — VANCOMYCIN HCL 750 MG/150ML IV SOLN
750.0000 mg | Freq: Two times a day (BID) | INTRAVENOUS | Status: DC
  Administered 2022-11-12 – 2022-11-15 (×6): 750 mg via INTRAVENOUS
  Filled 2022-11-12 (×8): qty 150

## 2022-11-12 MED ORDER — ENSURE MAX PROTEIN PO LIQD
11.0000 [oz_av] | Freq: Three times a day (TID) | ORAL | Status: DC
  Administered 2022-11-12 – 2022-11-16 (×9): 11 [oz_av] via ORAL
  Administered 2022-11-16: 1 via ORAL
  Administered 2022-11-16 – 2022-11-28 (×30): 11 [oz_av] via ORAL

## 2022-11-12 MED ORDER — VITAMIN D (ERGOCALCIFEROL) 1.25 MG (50000 UNIT) PO CAPS
50000.0000 [IU] | ORAL_CAPSULE | ORAL | Status: DC
  Administered 2022-11-12 – 2022-11-26 (×3): 50000 [IU] via ORAL
  Filled 2022-11-12 (×3): qty 1

## 2022-11-12 MED ORDER — VITAMIN C 500 MG PO TABS
500.0000 mg | ORAL_TABLET | Freq: Two times a day (BID) | ORAL | Status: DC
  Administered 2022-11-13 – 2022-11-28 (×29): 500 mg via ORAL
  Filled 2022-11-12 (×30): qty 1

## 2022-11-12 MED ORDER — ZINC SULFATE 220 (50 ZN) MG PO CAPS
220.0000 mg | ORAL_CAPSULE | Freq: Every day | ORAL | Status: AC
  Administered 2022-11-13 – 2022-11-26 (×12): 220 mg via ORAL
  Filled 2022-11-12 (×13): qty 1

## 2022-11-12 MED ORDER — VITAMIN D 25 MCG (1000 UNIT) PO TABS: 1000.0000 [IU] | ORAL_TABLET | Freq: Every day | ORAL | Status: DC

## 2022-11-12 NOTE — Progress Notes (Signed)
PT Cancellation Note  Patient Details Name: Shane Chambers MRN: [id number] DOB: 09-01-1961   Cancelled Treatment:    Reason Eval/Treat Not Completed: Patient not medically ready (Per OT, wound not dressed recently, extensive serosanginous drainage in bed, wound appearing susceptible to physical stress that would preclude extensive mobility at this time. Awaiting updates from medical team. Will defer eval to later date/time.)  2:56 PM, 11/12/22 [name], PT, DPT Physical Therapist - [facility]  [phone] ([place])    [place] C 11/12/2022, 2:56 PM

## 2022-11-12 NOTE — Consult Note (Signed)
Pharmacy Antibiotic Note  Shane Chambers is a 63 y.o. male w/ PMH of BPH, DM, CAD, CABG, CHF, HTN, hypothyroidism, depression, BKA admitted on 11/11/2022 with  right below-knee amputation infection .  Pharmacy has been consulted for vancomycin dosing.  Plan: adjust vancomycin to 750 mg IV every 12 hours Goal AUC 400-550  Est AUC: 453.6 Ke 0.062h-1, T1/2 11.2h Calculated with SCr 1.15 mg/dL Follow renal function for needed dose adjustments    Temp (24hrs), Avg:98 F (36.7 C), Min:97.4 F (36.3 C), Max:98.4 F (36.9 C)  Recent Labs  Lab 11/10/22 1916 11/11/22 0616 11/12/22 0610  WBC 12.7* 12.7* 10.8*  CREATININE 1.21 1.03 1.15     Estimated Creatinine Clearance: 83.3 mL/min (by C-G formula based on SCr of 1.15 mg/dL).    Allergies  Allergen Reactions   Ace Inhibitors Swelling and Cough    (Not on MAR at [facility])   Haloperidol And Related     Do NOT give anti-psychotics due to risk of  Torsades and QT prolongation (per Dr. Graciela Husbands request)   Other Itching    [name]   Shellfish Allergy    Penicillins Itching and Rash    Has patient had a PCN reaction causing immediate rash, facial/tongue/throat swelling, SOB or lightheadedness with hypotension: Unknown Has patient had a PCN reaction causing severe rash involving mucus membranes or skin necrosis: Unknown Has patient had a PCN reaction that required hospitalization: Unknown Has patient had a PCN reaction occurring within the last 10 years: Unknown If all of the above answers are "NO", then may proceed with Cephalosporin use.     Antimicrobials this admission: 1/1 vancomycin >>  1/1 metronidazole >> 1/1 CTX >>  Microbiology results: N/A  Thank you for allowing pharmacy to be a part of this patient's care.  Dallie Piles 11/12/2022 12:45 PM

## 2022-11-12 NOTE — Evaluation (Signed)
Occupational Therapy Evaluation Patient Details Name: Shane Chambers MRN: [id number] DOB: 1961-01-12 Today's Date: 11/12/2022   History of Present Illness Pt is a 62 year old male admitted to the medical unit, transfer from gero psych (admitted for SI) found to have complication of his amputation site, followed by vascular; PMH significant for CAD s/p CABG, HFrEF with recovered EF, PVD, type 2 diabetes, COPD, hypertension, hyperlipidemia, hypothyroidism, depression   Clinical Impression   Chart reviewed, pt known to this therapist from [facility] psych admission, evaluation completed upon transfer to medical floors. Pt presents with deficits in strength, endurance, activity tolerance, balance all affecting safe and optimal ADL completion. Pt states he may be in agreement for STR, but would not ever be in agreement for LTC (pt was previously at [place]). Bed sheets noted with discharge around site, pt tranfers to edge of bed with supervision and noted redness on residual RLE with openings noted along incision. RN notified, present to assess. Educated patient on optimal positioning, continued care for R residual limb. Further activity deferred on this date. Team notified re: findings, redness around incision/RLE, pt is eager to meet with team for plan moving forward. OT will continue to follow acutely.      Recommendations for follow up therapy are one component of a multi-disciplinary discharge planning process, led by the attending physician.  Recommendations may be updated based on patient status, additional functional criteria and insurance authorization.   Follow Up Recommendations  Skilled nursing-short term rehab (<3 hours/day)     Assistance Recommended at Discharge Frequent or constant Supervision/Assistance  Patient can return home with the following Two people to help with walking and/or transfers;Two people to help with bathing/dressing/bathroom;Assistance with cooking/housework;Direct  supervision/assist for medications management;Assist for transportation;Help with stairs or ramp for entrance;Direct supervision/assist for financial management    Functional Status Assessment  Patient has had a recent decline in their functional status and demonstrates the ability to make significant improvements in function in a reasonable and predictable amount of time.  Equipment Recommendations  BSC/3in1;Tub/shower seat;Wheelchair (measurements OT);Wheelchair cushion (measurements OT);Hospital bed    Recommendations for Other Services       Precautions / Restrictions Precautions Precautions: Fall Restrictions Other Position/Activity Restrictions: no activity/WB orders in chart, wound along R BKA incision, treated as NWBing today      Mobility Bed Mobility Overal bed mobility: Needs Assistance Bed Mobility: Supine to Sit, Rolling, Sit to Supine Rolling: Supervision   Supine to sit: Supervision, HOB elevated Sit to supine: Supervision, HOB elevated        Transfers                          Balance Overall balance assessment: Needs assistance Sitting-balance support: Single extremity supported Sitting balance-Leahy Scale: Good                                     ADL either performed or assessed with clinical judgement   ADL Overall ADL's : Needs assistance/impaired     Grooming: Wash/dry face;Set up;Sitting           Upper Body Dressing : Minimal assistance   Lower Body Dressing: Moderate assistance Lower Body Dressing Details (indicate cue type and reason): L sock               General ADL Comments: oob activity deferred due to noted opening on R  residual limb     Vision Baseline Vision/History: 1 Wears glasses Patient Visual Report: No change from baseline       Perception     Praxis      Pertinent Vitals/Pain Pain Assessment Pain Assessment: Faces Faces Pain Scale: Hurts little more Pain Location: R residual  limb Pain Descriptors / Indicators: Discomfort Pain Intervention(s): Limited activity within patient's tolerance, Monitored during session, Repositioned     Hand Dominance Right   Extremity/Trunk Assessment Upper Extremity Assessment Upper Extremity Assessment: Generalized weakness;[name] deficits/detail [name] Deficits / Details: grossly 4/5 throughout BUE, mild grip strength deficits [name] Coordination: decreased fine motor   Lower Extremity Assessment Lower Extremity Assessment: RLE deficits/detail RLE Deficits / Details: s/p R BKA, noted redness/ openings along incision       Communication     Cognition Arousal/Alertness: Awake/alert Behavior During Therapy: WFL for tasks assessed/performed, Flat affect Overall Cognitive Status: Within Functional Limits for tasks assessed Area of Impairment: Following commands, Safety/judgement, Awareness                   Current Attention Level: Selective Memory: Decreased recall of precautions Following Commands: Follows one step commands with increased time Safety/Judgement: Decreased awareness of safety, Decreased awareness of deficits Awareness: Emergent Problem Solving: Requires verbal cues, Requires tactile cues       General Comments       Exercises     Shoulder Instructions      Home Living Family/patient expects to be discharged to:: Skilled nursing facility                                 Additional Comments: pt lives at [facility] facility in [place], endorses he never wants to return to a SNF      Prior Functioning/Environment               Mobility Comments: pt reports recent use of patient care lift for transfers since recent BKA ADLs Comments: assist for all ADL/IADL        OT Problem List: Decreased activity tolerance;Impaired balance (sitting and/or standing);Decreased cognition;Decreased safety awareness;Decreased knowledge of use of DME or AE;Decreased knowledge of  precautions;Pain;Decreased strength      OT Treatment/Interventions: Self-care/ADL training;Therapeutic activities;DME and/or AE instruction;Patient/family education    OT Goals(Current goals can be found in the care plan section) Acute Rehab OT Goals Patient Stated Goal: never return to long term care OT Goal Formulation: With patient Time For Goal Achievement: 11/26/22 Potential to Achieve Goals: Fair ADL Goals Pt Will Perform Grooming: with supervision;sitting Pt Will Perform Lower Body Dressing: with min assist Pt Will Transfer to Toilet: with mod assist;bedside commode Pt Will Perform Toileting - Clothing Manipulation and hygiene: with mod assist;sitting/lateral leans  OT Frequency: Min 2X/week    Co-evaluation              AM-PAC OT "6 Clicks" Daily Activity     Outcome Measure   Help from another person taking care of personal grooming?: A Little Help from another person toileting, which includes using toliet, bedpan, or urinal?: Total Help from another person bathing (including washing, rinsing, drying)?: A Lot Help from another person to put on and taking off regular upper body clothing?: A Lot Help from another person to put on and taking off regular lower body clothing?: A Lot 6 Click Score: 10   End of Session Nurse Communication: Mobility status (R residual limb)  Activity Tolerance: Patient tolerated treatment well Patient left: in bed;with call bell/phone within reach;with bed alarm set  OT Visit Diagnosis: Unsteadiness on feet (R26.81);Muscle weakness (generalized) (M62.81)                Time: 9628-3662 OT Time Calculation (min): 20 min Charges:  OT General Charges $OT Visit: 1 Visit OT Evaluation $OT Eval Moderate Complexity: 1 Mod  Shanon Payor, OTD OTR/L  11/12/22, 2:51 PM

## 2022-11-12 NOTE — Progress Notes (Signed)
Initial Nutrition Assessment  DOCUMENTATION CODES:   Obesity unspecified  INTERVENTION:   Ensure Max protein supplement TID, each supplement provides 150kcal and 30g of protein.  MVI po daily   Vitamin C 575m po BID  Zinc 2233mpo daily x 14 days  Cholecalciferol 1000 units po daily   Dysphagia 3 diet   Check vitamins A, E, C, zinc and copper labs  Daily weights  NUTRITION DIAGNOSIS:   Increased nutrient needs related to wound healing as evidenced by estimated needs.  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  Met with pt in room today. Pt reports fairly good appetite and oral intake pta and in hospital. Pt reports eating spaghetti, salad and a roll for lunch today. Pt reports that he is eating most of his meals. Pt reports that he has been drinking his Ensure supplements (prefers strawberry). RD discussed with pt the importance of adequate nutrition needed to preserve lean muscle and to support wound healing. RD will change pt to a mechanical soft diet at his request as he reports trouble chewing tough meats and veggies. RD will add supplements and vitamins to help support wound healing. RD will check vitamin labs important for wound healing to r/o deficiency.   Medications reviewed and include: vitamin C, aspirin, lovenox, ferrous gluconate, insulin, synthroid, Mg sulfate, MVI, protonix, KCl, senokot, thiamine, torsemide, ceftriaxone, metronidazole, vancomycin   Labs reviewed: K 4.1 wnl, BUN 35(H), P 4.3 wnl, Mg 1.7 wnl Vitamin D 28.25(L)- 1/2 WBC- 10.8(H), Hgb 8.5(L), Hct 26.5(L) Cbgs- 147, 97 x 24 hrs  AIC  6.1(H)- 12/29   NUTRITION - FOCUSED PHYSICAL EXAM:  Flowsheet Row Most Recent Value  Orbital Region No depletion  Upper Arm Region Moderate depletion  Thoracic and Lumbar Region No depletion  Buccal Region No depletion  Temple Region No depletion  Clavicle Bone Region Mild depletion  Clavicle and Acromion Bone Region Mild depletion  Scapular Bone Region No depletion  Dorsal Hand Mild depletion  Patellar Region Moderate depletion  Anterior Thigh Region Moderate depletion  Posterior Calf Region Moderate depletion  Edema (RD Assessment) Moderate  Hair Reviewed  Eyes Reviewed  Mouth Reviewed  Skin Reviewed  Nails Reviewed   Diet Order:   Diet Order             DIET DYS 3 Room service appropriate? Yes; Fluid consistency: Thin  Diet effective now                  EDUCATION NEEDS:   Education needs have been addressed  Skin:  Skin Assessment: Reviewed RN Assessment (Stage II buttocks, incision R leg s/p recent BKA)  Last BM:  1/1  Height:   Ht Readings from Last 1 Encounters:  11/05/22 _0  (1.753 m)    Weight:   Wt Readings from Last 1 Encounters:  11/05/22 107 kg    Ideal Body Weight:  72.7 kg  Estimated Nutritional Needs:   Kcal:  2100-2400kcal/day  Protein:  105-120g/day  Fluid:  1.8-2.1L/day  CaKoleen DistanceS, RD, LDN Please refer to [facility] and/or RD on-call/weekend/after hours  pager

## 2022-11-12 NOTE — Progress Notes (Signed)
[facility] Progress Note  Patient: Shane Chambers    VPX:[id number]  DOA: 11/11/2022     Date of Service: the patient was seen and examined on 11/12/2022  No chief complaint on file.  Brief hospital course:  Shane Chambers is a 62 y.o. male with past medical history of CAD s/p CABG, HFrEF with recovered EF, PVD, type 2 diabetes, COPD, hypertension, hyperlipidemia, hypothyroidism, depression who is current he admitted for suicidal ideation and found to have complication of his amputation site.  Shane Chambers states that for the last 3 days, he has been experiencing increasing pain of his right BKA stump in addition to increasing erythema.  His nurse at bedside states that he has been picking at it. Patient has not noticed any purulent drainage at this time but there are areas where he is worried that the incision is opening. Per chart review, patient was admitted on 11/23 for cellulitis of the right lower extremity extending from the foot up towards the knee.  CT imaging at that time demonstrated holdable complex fluid collections and possible medial malleolus osteomyelitis.  MRI was subsequently obtained that demonstrated right ankle septic arthritis.  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  He followed up with orthopedic surgery on 12/14.  At that time, he was instructed to follow-up in 1 week for staple removal.   Assessment and Plan:  * Acute osteomyelitis (Shane Chambers) Initially patient was seen in consultation at [place], however required transfer to the medical unit due to inability to administer IV antibiotics.  Patient presented with 3 days of gradually worsening right stump erythema and pain.  Initial concern for cellulitis or only, however vascular surgery was able to put a probe down to the bone with 200 cc of purulent drainage, more consistent with osteomyelitis.  Inflammatory markers are elevated.   - Vascular surgery consulted; appreciate their recommendations - Per vascular  surgery, will plan for stump revision this week - Continue broad-spectrum coverage with vancomycin, ceftriaxone, and metronidazole given rapid onset of symptoms and high risk for complications - Tylenol as needed for fever - If patient should develop fever, please obtain blood cultures Started oxycodone 5 mg p.o. every 6 hourly as needed WBC count trending down Wound care consulted  Hypomagnesemia, mag repleted.  Resolved Monitor and replete as needed.  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  EKG today with improved QTc.    Hypertension, continue amlodipine, Lopressor HLD, continue statin  Depression - Continue SSRIs and antipsychotics started on recent [place] [facility] admission  Continue Abilify and Effexor  Type 2 diabetes mellitus (HCC) - SSI, moderate - Hold home metformin  Hypothyroid, continued Synthroid Constipation, continue Senokot BPH, continue Flomax Vitamin D insufficiency: started vitamin D 50,000 units p.o. weekly, follow with PCP to repeat vitamin D level after 3 to 6 months.   There is no height or weight on file to calculate BMI.  Interventions:    Pressure Injury 11/06/22 Buttocks Right Stage 2 -  Partial thickness loss of dermis presenting as a shallow open injury with a red, pink wound bed without slough. no odor. no drainage. no slough. 1cm X 0.3cm (Active)  11/06/22 1800  Location: Buttocks  Location Orientation: Right  Staging: Stage 2 -  Partial thickness loss of dermis presenting as a shallow open injury with a red, pink wound bed without slough.  Wound Description (Comments): no odor. no drainage. no slough. 1cm X 0.3cm  Present on Admission:  Diet: Heart healthy/carb modified DVT Prophylaxis: Subcutaneous Lovenox   Advance goals of care discussion: Full code  Family Communication: family was NOT present at bedside, at the time of interview.  The pt provided permission to discuss medical plan with the  family. Opportunity was given to ask question and all questions were answered satisfactorily.   Disposition:  Pt is from Home, admitted with right BKA infection and depression with SI/HI ideations under BHU, still has infection, on IV antibiotics, vascular surgery planning for revision of her right BKA, which precludes a safe discharge. Discharge to [facility], when cleared by vascular surgery.  Subjective: No significant events overnight, patient's left BKA stump pain is 7/10, controlled with current medications.  Patient denies any other active issues.  Physical Exam: General:  alert oriented to time, place, and person.  Appear in no distress, affect appropriate Eyes: PERRLA ENT: Oral Mucosa Clear, moist  Neck: no JVD,  Cardiovascular: S1 and S2 Present, no Murmur,  Respiratory: good respiratory effort, Bilateral Air entry equal and Decreased, no Crackles, no wheezes Abdomen: Bowel Sound present, Soft and no tenderness,  Skin: no rashes Extremities: no Pedal edema, no calf tenderness, s/p Right BKA stump mild erythema and tenderness, wound is open with some drainage. Neurologic: without any new focal findings Gait not checked due to patient safety concerns  Vitals:   11/11/22 2158 11/12/22 0441 11/12/22 0750 11/12/22 1540  BP: (!) 141/66 125/66 (!) 120/56 (!) 111/50  Pulse: 67 (!) 58 (!) 53   Resp:  '18 18 18  '$ Temp:  (!) 97.4 F (36.3 C) 98 F (36.7 C) 98 F (36.7 C)  TempSrc:  Oral  Oral  SpO2:  96% 98% 97%    Intake/Output Summary (Last 24 hours) at 11/12/2022 1620 Last data filed at 11/12/2022 1550 Gross per 24 hour  Intake 1546 ml  Output 800 ml  Net 746 ml   There were no vitals filed for this visit.  Data Reviewed: I have personally reviewed and interpreted daily labs, tele strips, imagings as discussed above. I reviewed all nursing notes, pharmacy notes, vitals, pertinent old records I have discussed plan of care as described above with RN and  patient/family.  CBC: Recent Labs  Lab 11/10/22 1916 11/11/22 0616 11/12/22 0610  WBC 12.7* 12.7* 10.8*  NEUTROABS 9.3*  --   --   HGB 9.1* 8.5* 8.5*  HCT 28.5* 26.8* 26.5*  MCV 90.8 91.2 90.1  PLT 303 306 706   Basic Metabolic Panel: Recent Labs  Lab 11/10/22 1916 11/11/22 0616 11/12/22 0610  NA 136 137 135  K 4.1 4.0 4.1  CL 100 103 101  CO2 '24 25 24  '$ GLUCOSE 181* 93 87  BUN 38* 33* 35*  CREATININE 1.21 1.03 1.15  CALCIUM 8.9 8.9 8.7*  MG  --  1.4* 1.7  PHOS  --  3.5 4.3    Studies: No results found.  Scheduled Meds:  amLODipine  10 mg Oral Daily   ARIPiprazole  10 mg Oral Daily   [START ON 11/13/2022] vitamin C  500 mg Oral BID   aspirin EC  81 mg Oral Q breakfast   atorvastatin  80 mg Oral QHS   [START ON 11/13/2022] cholecalciferol  1,000 Units Oral Daily   clotrimazole   Topical BID   enoxaparin (LOVENOX) injection  0.5 mg/kg Subcutaneous Q24H   ferrous gluconate  324 mg Oral Daily   fluticasone furoate-vilanterol  1 puff Inhalation Daily   insulin aspart  0-15 Units Subcutaneous  TID AC & HS   levothyroxine  75 mcg Oral Q0600   magnesium oxide  400 mg Oral Daily   metoprolol tartrate  100 mg Oral BID   multivitamin with minerals  1 tablet Oral Daily   pantoprazole  40 mg Oral Daily   polyvinyl alcohol  1 drop Both Eyes Daily   potassium chloride SA  20 mEq Oral Daily   Ensure Max Protein  11 oz Oral TID   senna-docusate  1 tablet Oral BID   sodium chloride flush  3 mL Intravenous Q12H   tamsulosin  0.8 mg Oral Daily   thiamine  100 mg Oral Daily   torsemide  40 mg Oral Daily   venlafaxine XR  150 mg Oral Q breakfast   [START ON 11/13/2022] zinc sulfate  220 mg Oral Daily   Continuous Infusions:  cefTRIAXone (ROCEPHIN)  IV Stopped (11/12/22 0523)   metronidazole Stopped (11/12/22 0636)   vancomycin     PRN Meds: acetaminophen, albuterol, ipratropium-albuterol, loperamide HCl, oxyCODONE, polyethylene glycol  Time spent: 35 minutes  Author: Val Riles. MD [facility] 11/12/2022 4:20 PM  To reach On-call, see care teams to locate the attending and reach out to them via [email or web address]. If 7PM-7AM, please contact night-coverage If you still have difficulty reaching the attending provider, please page the [facility] (Director on Call) for [facility] on amion for assistance.

## 2022-11-13 ENCOUNTER — Other Ambulatory Visit: Payer: Self-pay

## 2022-11-13 DIAGNOSIS — M8610 Other acute osteomyelitis, unspecified site: Secondary | ICD-10-CM | POA: Diagnosis not present

## 2022-11-13 LAB — CBC
HCT: 25.2 % — ABNORMAL LOW (ref 39.0–52.0)
Hemoglobin: 8.2 g/dL — ABNORMAL LOW (ref 13.0–17.0)
MCH: 29.3 pg (ref 26.0–34.0)
MCHC: 32.5 g/dL (ref 30.0–36.0)
MCV: 90 fL (ref 80.0–100.0)
Platelets: 288 10*3/uL (ref 150–400)
RBC: 2.8 MIL/uL — ABNORMAL LOW (ref 4.22–5.81)
RDW: 15.5 % (ref 11.5–15.5)
WBC: 9.9 10*3/uL (ref 4.0–10.5)
nRBC: 0 % (ref 0.0–0.2)

## 2022-11-13 LAB — BASIC METABOLIC PANEL
Anion gap: 8 (ref 5–15)
BUN: 49 mg/dL — ABNORMAL HIGH (ref 6–20)
CO2: 26 mmol/L (ref 22–32)
Calcium: 8.3 mg/dL — ABNORMAL LOW (ref 8.9–10.3)
Chloride: 99 mmol/L (ref 98–111)
Creatinine, Ser: 1.34 mg/dL — ABNORMAL HIGH (ref 0.61–1.24)
GFR, Estimated: >60 mL/min (ref >60)
Glucose, Bld: 92 mg/dL (ref 70–99)
Potassium: 4 mmol/L (ref 3.5–5.1)
Sodium: 133 mmol/L — ABNORMAL LOW (ref 135–145)

## 2022-11-13 LAB — GLUCOSE, CAPILLARY
Glucose-Capillary: 94 mg/dL (ref 70–99)
Glucose-Capillary: 179 mg/dL — ABNORMAL HIGH (ref 70–99)
Glucose-Capillary: 158 mg/dL — ABNORMAL HIGH (ref 70–99)
Glucose-Capillary: 131 mg/dL — ABNORMAL HIGH (ref 70–99)

## 2022-11-13 LAB — MAGNESIUM: Magnesium: 1.6 mg/dL — ABNORMAL LOW (ref 1.7–2.4)

## 2022-11-13 LAB — PHOSPHORUS: Phosphorus: 5.2 mg/dL — ABNORMAL HIGH (ref 2.5–4.6)

## 2022-11-13 MED ORDER — METOPROLOL TARTRATE 50 MG PO TABS
50.0000 mg | ORAL_TABLET | Freq: Two times a day (BID) | ORAL | Status: DC
  Administered 2022-11-13 – 2022-11-24 (×20): 50 mg via ORAL
  Administered 2022-11-24: 25 mg via ORAL
  Administered 2022-11-25 – 2022-11-28 (×5): 50 mg via ORAL
  Filled 2022-11-13 (×31): qty 1

## 2022-11-13 MED ORDER — HYDRALAZINE HCL 50 MG PO TABS: 50.0000 mg | ORAL_TABLET | Freq: Four times a day (QID) | ORAL | Status: DC | PRN

## 2022-11-13 MED ORDER — CHLORHEXIDINE GLUCONATE 4 % EX LIQD
60.0000 mL | Freq: Once | CUTANEOUS | Status: AC
  Administered 2022-11-14: 4 via TOPICAL

## 2022-11-13 MED ORDER — POLYETHYLENE GLYCOL 3350 17 G PO PACK
17.0000 g | PACK | Freq: Two times a day (BID) | ORAL | Status: DC
  Administered 2022-11-13 – 2022-11-21 (×16): 17 g via ORAL
  Filled 2022-11-13 (×16): qty 1

## 2022-11-13 MED ORDER — SODIUM CHLORIDE 0.9 % IV SOLN: INTRAVENOUS | Status: DC

## 2022-11-13 MED ORDER — CHLORHEXIDINE GLUCONATE 4 % EX LIQD
60.0000 mL | Freq: Once | CUTANEOUS | Status: AC
  Administered 2022-11-13: 4 via TOPICAL

## 2022-11-13 MED ORDER — FAMOTIDINE 20 MG PO TABS: 40.0000 mg | ORAL_TABLET | Freq: Once | ORAL | Status: DC | PRN

## 2022-11-13 MED ORDER — MEDIHONEY WOUND/BURN DRESSING EX PSTE
1.0000 | PASTE | Freq: Every day | CUTANEOUS | Status: DC
  Administered 2022-11-13: 1 via TOPICAL
  Filled 2022-11-13: qty 44

## 2022-11-13 MED ORDER — DIPHENHYDRAMINE HCL 50 MG/ML IJ SOLN: 50.0000 mg | Freq: Once | INTRAMUSCULAR | Status: DC | PRN

## 2022-11-13 MED ORDER — METHYLPREDNISOLONE SODIUM SUCC 125 MG IJ SOLR: 125.0000 mg | Freq: Once | INTRAMUSCULAR | Status: DC | PRN

## 2022-11-13 MED ORDER — OXYCODONE HCL 5 MG PO TABS
5.0000 mg | ORAL_TABLET | ORAL | Status: DC | PRN
  Administered 2022-11-14 – 2022-11-27 (×14): 5 mg via ORAL
  Filled 2022-11-13 (×15): qty 1

## 2022-11-13 MED ORDER — BISACODYL 5 MG PO TBEC
10.0000 mg | DELAYED_RELEASE_TABLET | Freq: Every day | ORAL | Status: DC
  Administered 2022-11-13 – 2022-11-20 (×7): 10 mg via ORAL
  Filled 2022-11-13 (×8): qty 2

## 2022-11-13 MED ORDER — ONDANSETRON HCL 4 MG/2ML IJ SOLN
4.0000 mg | Freq: Four times a day (QID) | INTRAMUSCULAR | Status: DC | PRN
  Administered 2022-11-18: 4 mg via INTRAVENOUS

## 2022-11-13 MED ORDER — MIDAZOLAM HCL 2 MG/ML PO SYRP: 8.0000 mg | ORAL_SOLUTION | Freq: Once | ORAL | Status: DC | PRN

## 2022-11-13 NOTE — Plan of Care (Signed)
  Problem: Skin Integrity: Goal: Risk for impaired skin integrity will decrease Outcome: Progressing   

## 2022-11-13 NOTE — TOC Progression Note (Signed)
Transition of Care [facility]) - Progression Note    Patient Details  Name: Shane Chambers MRN: [id number] Date of Birth: December 17, 1960  Transition of Care [facility]) CM/SW [place], LCSW Phone Number: 11/13/2022, 3:58 PM  Clinical Narrative:   [facility] made bed offer but admissions coordinator said they don't have any LTC beds and she's pretty sure they don't have short-term rehab beds either. [facility] in [place] also offered a bed. Left voicemail for admissions coordinator to confirm they wouldn't be able to accept him until he had [place].  Expected Discharge Plan and Services                                               Social Determinants of Health (SDOH) Interventions SDOH Screenings   Food Insecurity: No Food Insecurity (11/13/2022)  Housing: Low Risk  (11/13/2022)  Transportation Needs: No Transportation Needs (11/13/2022)  Utilities: Not At Risk (11/13/2022)  Alcohol Screen: Low Risk  (11/05/2022)  Tobacco Use: Medium Risk (11/05/2022)    Readmission Risk Interventions    11/11/2022    2:05 PM 10/03/2022   10:08 AM 09/26/2022    1:16 PM  Readmission Risk Prevention Plan  Transportation Screening Complete Complete Complete  PCP or Specialist Appt within 3-5 Days   Complete  HRI or [place]   Complete  Social Work Consult for [place] Planning/Counseling   Complete  Palliative Care Screening   Not Applicable  Medication Review [employer])   Complete  HRI or Home Care Consult Complete Complete   SW Recovery Care/Counseling Consult Complete Complete   Palliative Care Screening Not Applicable Not Applicable   Skilled Nursing Facility Complete Complete

## 2022-11-13 NOTE — Progress Notes (Signed)
[facility] Progress Note  Patient: Shane Chambers    NWG:[id number]  DOA: 11/11/2022     Date of Service: the patient was seen and examined on 11/13/2022  No chief complaint on file.  Brief hospital course:  Shane Chambers is a 62 y.o. male with past medical history of CAD s/p CABG, HFrEF with recovered EF, PVD, type 2 diabetes, COPD, hypertension, hyperlipidemia, hypothyroidism, depression who is current he admitted for suicidal ideation and found to have complication of his amputation site.  Mr. Kloster states that for the last 3 days, he has been experiencing increasing pain of his right BKA stump in addition to increasing erythema.  His nurse at bedside states that he has been picking at it. Patient has not noticed any purulent drainage at this time but there are areas where he is worried that the incision is opening. Per chart review, patient was admitted on 11/23 for cellulitis of the right lower extremity extending from the foot up towards the knee.  CT imaging at that time demonstrated holdable complex fluid collections and possible medial malleolus osteomyelitis.  MRI was subsequently obtained that demonstrated right ankle septic arthritis.  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  He followed up with orthopedic surgery on 12/14.  At that time, he was instructed to follow-up in 1 week for staple removal.   Assessment and Plan:  * Acute osteomyelitis ([place]) Initially patient was seen in consultation at [facility], however required transfer to the medical unit due to inability to administer IV antibiotics.  Patient presented with 3 days of gradually worsening right stump erythema and pain.  Initial concern for cellulitis or only, however vascular surgery was able to put a probe down to the bone with 200 cc of purulent drainage, more consistent with osteomyelitis.  Inflammatory markers are elevated.   - Vascular surgery consulted; appreciate their recommendations - Per vascular  surgery, will plan for stump revision this week, most likely tomorrow on Friday - Continue broad-spectrum coverage with vancomycin, ceftriaxone, and metronidazole given rapid onset of symptoms and high risk for complications - Tylenol as needed for fever - If patient should develop fever, please obtain blood cultures changed oxycodone 5 mg p.o. every 4 hourly as needed WBC count trending down Wound care consulted  Hypomagnesemia, mag repleted.  Resolved Monitor and replete as needed.  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  EKG today with improved QTc.    Hypertension, continue amlodipine, Lopressor HLD, continue statin  Depression - Continue SSRIs and antipsychotics started on recent [facility] admission  Continue Abilify and Effexor  Type 2 diabetes mellitus (HCC) - SSI, moderate - Hold home metformin  Hypothyroid, continued Synthroid Constipation, continue started MiraLAX and Dulcolax nightly.  Discussed the dose if no constipation BPH, continue Flomax Vitamin D insufficiency: started vitamin D 50,000 units p.o. weekly, follow with PCP to repeat vitamin D level after 3 to 6 months.   There is no height or weight on file to calculate BMI.  Interventions:    Pressure Injury 11/06/22 Buttocks Right Stage 2 -  Partial thickness loss of dermis presenting as a shallow open injury with a red, pink wound bed without slough. no odor. no drainage. no slough. 1cm X 0.3cm (Active)  11/06/22 1800  Location: Buttocks  Location Orientation: Right  Staging: Stage 2 -  Partial thickness loss of dermis presenting as a shallow open injury with a red, pink wound bed without slough.  Wound Description (Comments): no odor. no drainage. no slough. 1cm X 0.3cm  Present on Admission:      Diet: Heart healthy/carb modified DVT Prophylaxis: Subcutaneous Lovenox   Advance goals of care discussion: Full code  Family Communication: family was NOT present at  bedside, at the time of interview.  The pt provided permission to discuss medical plan with the family. Opportunity was given to ask question and all questions were answered satisfactorily.   Disposition:  Pt is from Home, admitted with right BKA infection and depression with SI/HI ideations under BHU, still has infection, on IV antibiotics, vascular surgery planning for revision of right BKA, which precludes a safe discharge. Discharge to [facility], when cleared by vascular surgery.  Subjective: No significant events overnight, patient's left BKA stump pain is 6/10, controlled with current medications.  Patient denies any other active issues.  Physical Exam: General:  alert oriented to time, place, and person.  Appear in no distress, affect appropriate Eyes: PERRLA ENT: Oral Mucosa Clear, moist  Neck: no JVD,  Cardiovascular: S1 and S2 Present, no Murmur,  Respiratory: good respiratory effort, Bilateral Air entry equal and Decreased, no Crackles, no wheezes Abdomen: Bowel Sound present, Soft and no tenderness,  Skin: no rashes Extremities: no Pedal edema, no calf tenderness, s/p Right BKA stump mild erythema and tenderness, wound is open with some drainage. Neurologic: without any new focal findings Gait not checked due to patient safety concerns  Vitals:   11/12/22 1926 11/12/22 2100 11/13/22 0409 11/13/22 0738  BP: (!) 114/59 (!) 109/55 118/68 (!) 112/47  Pulse: 61 (!) 56 (!) 59 (!) 57  Resp: '19  18 18  '$ Temp: 98.1 F (36.7 C)  97.6 F (36.4 C) 98.2 F (36.8 C)  TempSrc:      SpO2: 97%  98% 94%    Intake/Output Summary (Last 24 hours) at 11/13/2022 1418 Last data filed at 11/13/2022 1326 Gross per 24 hour  Intake 1282.19 ml  Output 675 ml  Net 607.19 ml   There were no vitals filed for this visit.  Data Reviewed: I have personally reviewed and interpreted daily labs, tele strips, imagings as discussed above. I reviewed all nursing notes, pharmacy notes, vitals, pertinent old  records I have discussed plan of care as described above with RN and patient/family.  CBC: Recent Labs  Lab 11/10/22 1916 11/11/22 0616 11/12/22 0610 11/13/22 0857  WBC 12.7* 12.7* 10.8* 9.9  NEUTROABS 9.3*  --   --   --   HGB 9.1* 8.5* 8.5* 8.2*  HCT 28.5* 26.8* 26.5* 25.2*  MCV 90.8 91.2 90.1 90.0  PLT 303 306 322 540   Basic Metabolic Panel: Recent Labs  Lab 11/10/22 1916 11/11/22 0616 11/12/22 0610 11/13/22 0857  NA 136 137 135 133*  K 4.1 4.0 4.1 4.0  CL 100 103 101 99  CO2 '24 25 24 26  '$ GLUCOSE 181* 93 87 92  BUN 38* 33* 35* 49*  CREATININE 1.21 1.03 1.15 1.34*  CALCIUM 8.9 8.9 8.7* 8.3*  MG  --  1.4* 1.7 1.6*  PHOS  --  3.5 4.3 5.2*    Studies: No results found.  Scheduled Meds:  amLODipine  10 mg Oral Daily   ARIPiprazole  10 mg Oral Daily   vitamin C  500 mg Oral BID   aspirin EC  81 mg Oral Q breakfast   atorvastatin  80 mg Oral QHS   clotrimazole   Topical BID   enoxaparin (LOVENOX) injection  0.5 mg/kg  Subcutaneous Q24H   ferrous gluconate  324 mg Oral Daily   fluticasone furoate-vilanterol  1 puff Inhalation Daily   insulin aspart  0-15 Units Subcutaneous TID AC & HS   leptospermum manuka honey  1 Application Topical Daily   levothyroxine  75 mcg Oral Q0600   magnesium oxide  400 mg Oral Daily   metoprolol tartrate  50 mg Oral BID   multivitamin with minerals  1 tablet Oral Daily   pantoprazole  40 mg Oral Daily   polyvinyl alcohol  1 drop Both Eyes Daily   potassium chloride SA  20 mEq Oral Daily   Ensure Max Protein  11 oz Oral TID   senna-docusate  1 tablet Oral BID   sodium chloride flush  3 mL Intravenous Q12H   tamsulosin  0.8 mg Oral Daily   thiamine  100 mg Oral Daily   torsemide  40 mg Oral Daily   venlafaxine XR  150 mg Oral Q breakfast   Vitamin D (Ergocalciferol)  50,000 Units Oral Q7 days   zinc sulfate  220 mg Oral Daily   Continuous Infusions:  cefTRIAXone (ROCEPHIN)  IV 200 mL/hr at 11/13/22 0500   metronidazole 500 mg  (11/13/22 0636)   vancomycin 750 mg (11/13/22 1001)   PRN Meds: acetaminophen, albuterol, hydrALAZINE, ipratropium-albuterol, loperamide HCl, oxyCODONE, polyethylene glycol  Time spent: 35 minutes  Author: Val Riles. MD [facility] 11/13/2022 2:18 PM  To reach On-call, see care teams to locate the attending and reach out to them via [email or web address]. If 7PM-7AM, please contact night-coverage If you still have difficulty reaching the attending provider, please page the [facility] (Director on Call) for [facility] on amion for assistance.

## 2022-11-13 NOTE — Consult Note (Addendum)
WOC Nurse Consult Note: Pt was previously in the psych area of the hospital but was discharged and readmitted.   Consult performed remotely after review of progress notes and photos in the EMR.  Today: requested to provide topical treatment for right AKA.  Vascular team is following for assessment and plan of care and progress notes indicate they plan to perform a surgical revision eventually.  Full thickness post-op incision is dehisced with yellow wound bed and probes deeply, according to progress notes. Mod amt yellow drainage.  Dressing procedure/placement/frequency: Topical treatment orders provided for bedside nurses to perform as follows to assist with removal of nonviable tissue: Apply Medihoney to right AKA stump Q day, then cover with gauze and tape or kerlex. Please refer to vascular team for further plan of care.  Please re-consult if further assistance is needed.  Thank-you,  Julien Girt MSN, [place], [place], [place], [place]

## 2022-11-13 NOTE — Progress Notes (Signed)
Patient stated to RN that he heard a voice telling him to, "Get into the chair." Patient stated the voice was not his own internal voice, but someone else's voice that he did not recognize. Patient didn't feel threatened, but felt a little scared about the occurrence. Patient denied to any thoughts of self harm or suicidal ideation due to the voice.    Fuller Mandril, RN

## 2022-11-13 NOTE — Progress Notes (Signed)
Occupational Therapy Treatment Patient Details Name: Shane Chambers MRN: [id number] DOB: 12/17/1960 Today's Date: 11/13/2022   History of present illness Pt is a 62 year old male admitted to the medical unit, transfer from gero psych (admitted for SI) found to have complication of his amputation site, followed by vascular; PMH significant for CAD s/p CABG, HFrEF with recovered EF, PVD, type 2 diabetes, COPD, hypertension, hyperlipidemia, hypothyroidism, depression   OT comments  Chart reviewed, pt greeted in chair agreeable to OT tx session. Pt reports "I had to get up, I was starting to hear voices but it is better now I got up". Tx session targeted improving indep ADL status via task oriented training. Grooming tasks completed with SET UP, UB dressing with MIN A. Pt educated on attempt for further mobility, declined STS attempts and wanted to remain in the chair. Pt and NT educated on lateral scoot with L arm down back to the bed. RLE positioned appropriately, wrapped. Pt is left as received, all needs met. OT will continue to follow acutely.    Recommendations for follow up therapy are one component of a multi-disciplinary discharge planning process, led by the attending physician.  Recommendations may be updated based on patient status, additional functional criteria and insurance authorization.    Follow Up Recommendations  Skilled nursing-short term rehab (<3 hours/day)     Assistance Recommended at Discharge Frequent or constant Supervision/Assistance  Patient can return home with the following  Two people to help with walking and/or transfers;Two people to help with bathing/dressing/bathroom;Assistance with cooking/housework;Direct supervision/assist for medications management;Assist for transportation;Help with stairs or ramp for entrance;Direct supervision/assist for financial management   Equipment Recommendations  BSC/3in1;Tub/shower seat;Wheelchair (measurements OT);Wheelchair  cushion (measurements OT);Hospital bed    Recommendations for Other Services      Precautions / Restrictions Precautions Precautions: Fall Restrictions Weight Bearing Restrictions: Yes RLE Weight Bearing: Non weight bearing       Mobility Bed Mobility               General bed mobility comments: NT in chair pre/post session    Transfers                   General transfer comment: attempted to stand on this date, offered back to bed, pt declined stating he felt better sitting in the chair     Balance Overall balance assessment: Needs assistance Sitting-balance support: Single extremity supported Sitting balance-Leahy Scale: Good                                     ADL either performed or assessed with clinical judgement   ADL Overall ADL's : Needs assistance/impaired                                       General ADL Comments: SET UP for grooming tasks (washing hair, washing face, brushing teeth), MIN A for UB dressing    Extremity/Trunk Assessment              Vision       Perception     Praxis      Cognition Arousal/Alertness: Awake/alert Behavior During Therapy: WFL for tasks assessed/performed, Flat affect Overall Cognitive Status: No family/caregiver present to determine baseline cognitive functioning Area of Impairment: Safety/judgement, Awareness  Safety/Judgement: Decreased awareness of deficits, Decreased awareness of safety Awareness: Emergent Problem Solving: Requires verbal cues, Requires tactile cues          Exercises      Shoulder Instructions       General Comments R residual limb wrapped    Pertinent Vitals/ Pain       Pain Assessment Pain Assessment: Faces Faces Pain Scale: Hurts little more Pain Location: R residual limb Pain Descriptors / Indicators: Discomfort, Grimacing Pain Intervention(s): Limited activity within patient's tolerance,  Monitored during session, Repositioned  Home Living                                          Prior Functioning/Environment              Frequency  Min 2X/week        Progress Toward Goals  OT Goals(current goals can now be found in the care plan section)  Progress towards OT goals: Progressing toward goals     Plan Discharge plan remains appropriate;Frequency remains appropriate    Co-evaluation                 AM-PAC OT "6 Clicks" Daily Activity     Outcome Measure   Help from another person eating meals?: None Help from another person taking care of personal grooming?: A Little Help from another person toileting, which includes using toliet, bedpan, or urinal?: Total Help from another person bathing (including washing, rinsing, drying)?: A Lot Help from another person to put on and taking off regular upper body clothing?: A Lot Help from another person to put on and taking off regular lower body clothing?: A Lot 6 Click Score: 14    End of Session    OT Visit Diagnosis: Unsteadiness on feet (R26.81);Muscle weakness (generalized) (M62.81)   Activity Tolerance Patient tolerated treatment well   Patient Left in chair;with call bell/phone within reach   Nurse Communication Mobility status        Time: 7035-0093 OT Time Calculation (min): 19 min  Charges: OT General Charges $OT Visit: 1 Visit OT Treatments $Self Care/Home Management : 8-22 mins  Shanon Payor, OTD OTR/L  11/13/22, 3:36 PM

## 2022-11-13 NOTE — Evaluation (Signed)
Physical Therapy Evaluation Patient Details Name: Shane Chambers MRN: [id number] DOB: 20-Feb-1961 Today's Date: 11/13/2022  History of Present Illness  Pt is a 62 year old male admitted to the medical unit, transfer from gero psych (admitted for SI) found to have complication of his amputation site, followed by vascular; PMH significant for CAD s/p CABG, HFrEF with recovered EF, PVD, type 2 diabetes, COPD, hypertension, hyperlipidemia, hypothyroidism, depression  Clinical Impression  Though initially he was slow to participate he ultimately showed good effort and interest in working with PT.  He was able to move relatively well in the bed but struggled with any attempts at trying to rise to standing.  He has R>L UE weakness that clearly limited his ability to get weight shifting forward/up into walker, he was unable and willing to do multiple standing efforts but ultimately, even with max assist and elevated bed height, could only minimally get hips off bed despite plenty of cuing and assist.  Encouraged pt to insure he is at least doing some bed push-up exercises TID to help with functional strength during transfers.       Recommendations for follow up therapy are one component of a multi-disciplinary discharge planning process, led by the attending physician.  Recommendations may be updated based on patient status, additional functional criteria and insurance authorization.  Follow Up Recommendations Follow physician's recommendations for discharge plan and follow up therapies Can patient physically be transported by private vehicle: No    Assistance Recommended at Discharge Frequent or constant Supervision/Assistance  Patient can return home with the following  Two people to help with walking and/or transfers;Direct supervision/assist for medications management;Assist for transportation;Help with stairs or ramp for entrance;A lot of help with bathing/dressing/bathroom    Equipment  Recommendations  (TBD per d/c venue)  Recommendations for Other Services       Functional Status Assessment Patient has had a recent decline in their functional status and/or demonstrates limited ability to make significant improvements in function in a reasonable and predictable amount of time     Precautions / Restrictions Precautions Precautions: Fall Restrictions Weight Bearing Restrictions: Yes [name] Weight Bearing: Weight bearing as tolerated LUE Weight Bearing: Weight bearing as tolerated RLE Weight Bearing: Non weight bearing LLE Weight Bearing: Weight bearing as tolerated      Mobility  Bed Mobility Overal bed mobility: Needs Assistance Bed Mobility: Rolling, Sidelying to Sit, Sit to Supine Rolling: Supervision Sidelying to sit: Min guard   Sit to supine: Min guard   General bed mobility comments: Cues to use rail, once he utilized he was able to get himself up to sitting w/o direct assist, though clearly needing heavy effort    Transfers Overall transfer level: Needs assistance Equipment used: Rolling walker (2 wheels) Transfers: Sit to/from Stand, Bed to chair/wheelchair/BSC Sit to Stand: Max assist, From elevated surface           General transfer comment: Attempted at least 4 standing attenpts from progressively increaesd bed height; with heavy cuing and assist.  Ultimately even with Max A and very elevated bed we were only able to elevate hips minimally off bed and despite good apparent effort he showed fatigue and frustration with these. Assist from PT to keep L foot from sliding and direct assist to help knee extension unable to attain upright/get weighty forwardward and over the walker.    Ambulation/Gait                  Stairs  Wheelchair Mobility    Modified Rankin (Stroke Patients Only)       Balance Overall balance assessment: Needs assistance Sitting-balance support: Single extremity supported Sitting balance-Leahy  Scale: Good       Standing balance-Leahy Scale: Zero                               Pertinent Vitals/Pain Pain Assessment Pain Assessment: 0-10 Pain Score: 6  Pain Location: R residual limb    Home Living Family/patient expects to be discharged to:: Skilled nursing facility                   Additional Comments: prior to amputation was apparently living with mother in "run down trailer", has been at [facility] [place])    Prior Function Prior Level of Function : Needs assist             Mobility Comments: reports very limited mobilty/standing and mostly needing lift for transfers since recent BKA ADLs Comments: assist for all ADL/IADL     Hand Dominance        Extremity/Trunk Assessment   Upper Extremity Assessment [name] Deficits / Details: Thenar and genral UE atrophy visually apparent. R UE weaker than L.  Does not show great grip or tricep b/l (R grossly 3+/5, L -4/5), lacks ability to fully open/extend fingers.    Lower Extremity Assessment Lower Extremity Assessment:  (L LE grossly 4/5) RLE Deficits / Details: s/p R BKA, noted redness/ openings along incision       Communication   Communication: No difficulties  Cognition Arousal/Alertness: Awake/alert Behavior During Therapy: WFL for tasks assessed/performed, Flat affect Overall Cognitive Status: Within Functional Limits for tasks assessed                                 General Comments: Pt initially with somewhat protracted interaction, but with continued discussion and encouragement was participratory and cooperative showing good effort t/o        General Comments General comments (skin integrity, edema, etc.): Pt with redness and some open areas on the R residual limb    Exercises     Assessment/Plan    PT Assessment Patient needs continued PT services  PT Problem List Decreased strength;Decreased range of motion;Decreased activity tolerance;Decreased  balance;Decreased mobility;Pain;Decreased knowledge of use of DME       PT Treatment Interventions DME instruction;Therapeutic activities;Therapeutic exercise;Functional mobility training;Balance training;Patient/family education;Wheelchair mobility training    PT Goals (Current goals can be found in the Care Plan section)  Acute Rehab PT Goals Patient Stated Goal: get stronger Time For Goal Achievement: 11/26/22 Potential to Achieve Goals: Fair    Frequency Min 2X/week     Co-evaluation               AM-PAC PT "6 Clicks" Mobility  Outcome Measure Help needed turning from your back to your side while in a flat bed without using bedrails?: A Little Help needed moving from lying on your back to sitting on the side of a flat bed without using bedrails?: A Lot Help needed moving to and from a bed to a chair (including a wheelchair)?: Total Help needed standing up from a chair using your arms (e.g., wheelchair or bedside chair)?: Total Help needed to walk in hospital room?: Total Help needed climbing 3-5 steps with a railing? : Total 6 Click Score:  9    End of Session Equipment Utilized During Treatment: Gait belt Activity Tolerance: Patient tolerated treatment well Patient left: in bed;with call bell/phone within reach;with bed alarm set;with nursing/sitter in room   PT Visit Diagnosis: Muscle weakness (generalized) (M62.81)    Time: 5927-6394 PT Time Calculation (min) (ACUTE ONLY): 39 min   Charges:   PT Evaluation $PT Eval Low Complexity: 1 Low PT Treatments $Therapeutic Activity: 23-37 mins        Kreg Shropshire, DPT 11/13/2022, 11:17 AM

## 2022-11-13 NOTE — Consult Note (Signed)
Pharmacy Antibiotic Note  Shane Chambers is a 62 y.o. male w/ PMH of BPH, DM, CAD, CABG, CHF, HTN, hypothyroidism, depression, BKA admitted on 11/11/2022 with  right below-knee amputation infection .  Pharmacy has been consulted for vancomycin dosing.  Plan: continue vancomycin to 750 mg IV every 12 hours Goal AUC 400-550  Est AUC: 522.4 Ke 0.054h-1, T1/2 12.9h Calculated with SCr 1.34 mg/dL Follow renal function for needed dose adjustments    Temp (24hrs), Avg:98 F (36.7 C), Min:97.6 F (36.4 C), Max:98.2 F (36.8 C)  Recent Labs  Lab 11/10/22 1916 11/11/22 0616 11/12/22 0610  WBC 12.7* 12.7* 10.8*  CREATININE 1.21 1.03 1.15     Estimated Creatinine Clearance: 83.3 mL/min (by C-G formula based on SCr of 1.15 mg/dL).    Allergies  Allergen Reactions   Ace Inhibitors Swelling and Cough    (Not on MAR at [facility])   Haloperidol And Related     Do NOT give anti-psychotics due to risk of  Torsades and QT prolongation (per Dr. Graciela Husbands request)   Other Itching    [name]   Shellfish Allergy    Penicillins Itching and Rash    Has patient had a PCN reaction causing immediate rash, facial/tongue/throat swelling, SOB or lightheadedness with hypotension: Unknown Has patient had a PCN reaction causing severe rash involving mucus membranes or skin necrosis: Unknown Has patient had a PCN reaction that required hospitalization: Unknown Has patient had a PCN reaction occurring within the last 10 years: Unknown If all of the above answers are "NO", then may proceed with Cephalosporin use.     Antimicrobials this admission: 1/1 vancomycin >>  1/1 metronidazole >> 1/1 CTX >>  Microbiology results: N/A  Thank you for allowing pharmacy to be a part of this patient's care.  Lorin Picket, PharmD 11/13/2022 8:35 AM

## 2022-11-14 ENCOUNTER — Inpatient Hospital Stay: Payer: Medicare Other | Admitting: Registered Nurse

## 2022-11-14 ENCOUNTER — Encounter: Admission: AD | Disposition: A | Discharge status: Skilled Nursing Facility | Payer: Self-pay | Attending: Student

## 2022-11-14 ENCOUNTER — Encounter: Payer: Self-pay | Admitting: Internal Medicine

## 2022-11-14 ENCOUNTER — Other Ambulatory Visit: Payer: Self-pay

## 2022-11-14 DIAGNOSIS — M8610 Other acute osteomyelitis, unspecified site: Secondary | ICD-10-CM | POA: Diagnosis not present

## 2022-11-14 DIAGNOSIS — T8743 Infection of amputation stump, right lower extremity: Principal | ICD-10-CM

## 2022-11-14 HISTORY — PX: AMPUTATION: SHX166

## 2022-11-14 LAB — GLUCOSE, CAPILLARY
Glucose-Capillary: 103 mg/dL — ABNORMAL HIGH (ref 70–99)
Glucose-Capillary: 98 mg/dL (ref 70–99)
Glucose-Capillary: 139 mg/dL — ABNORMAL HIGH (ref 70–99)
Glucose-Capillary: 200 mg/dL — ABNORMAL HIGH (ref 70–99)
Glucose-Capillary: 228 mg/dL — ABNORMAL HIGH (ref 70–99)

## 2022-11-14 LAB — CBC
HCT: 26.4 % — ABNORMAL LOW (ref 39.0–52.0)
Hemoglobin: 8.3 g/dL — ABNORMAL LOW (ref 13.0–17.0)
MCH: 28.5 pg (ref 26.0–34.0)
MCHC: 31.4 g/dL (ref 30.0–36.0)
MCV: 90.7 fL (ref 80.0–100.0)
Platelets: 293 10*3/uL (ref 150–400)
RBC: 2.91 MIL/uL — ABNORMAL LOW (ref 4.22–5.81)
RDW: 15.3 % (ref 11.5–15.5)
WBC: 9.5 10*3/uL (ref 4.0–10.5)
nRBC: 0 % (ref 0.0–0.2)

## 2022-11-14 LAB — BASIC METABOLIC PANEL
Anion gap: 8 (ref 5–15)
BUN: 63 mg/dL — ABNORMAL HIGH (ref 6–20)
CO2: 25 mmol/L (ref 22–32)
Calcium: 8.7 mg/dL — ABNORMAL LOW (ref 8.9–10.3)
Chloride: 100 mmol/L (ref 98–111)
Creatinine, Ser: 1.3 mg/dL — ABNORMAL HIGH (ref 0.61–1.24)
GFR, Estimated: >60 mL/min (ref >60)
Glucose, Bld: 109 mg/dL — ABNORMAL HIGH (ref 70–99)
Potassium: 4.2 mmol/L (ref 3.5–5.1)
Sodium: 133 mmol/L — ABNORMAL LOW (ref 135–145)

## 2022-11-14 LAB — MAGNESIUM: Magnesium: 1.7 mg/dL (ref 1.7–2.4)

## 2022-11-14 LAB — PHOSPHORUS: Phosphorus: 4.6 mg/dL (ref 2.5–4.6)

## 2022-11-14 SURGERY — AMPUTATION BELOW KNEE
Anesthesia: General | Site: Knee | Laterality: Right

## 2022-11-14 MED ORDER — ONDANSETRON HCL 4 MG/2ML IJ SOLN
INTRAMUSCULAR | Status: DC | PRN
  Administered 2022-11-14: 4 mg via INTRAVENOUS

## 2022-11-14 MED ORDER — HYDROMORPHONE HCL 1 MG/ML IJ SOLN
1.0000 mg | Freq: Once | INTRAMUSCULAR | Status: AC | PRN
  Administered 2022-11-19: 1 mg via INTRAVENOUS
  Filled 2022-11-14: qty 1

## 2022-11-14 MED ORDER — FENTANYL CITRATE (PF) 100 MCG/2ML IJ SOLN
INTRAMUSCULAR | Status: AC
  Administered 2022-11-14: 25 ug via INTRAVENOUS
  Filled 2022-11-14: qty 2

## 2022-11-14 MED ORDER — DEXAMETHASONE SODIUM PHOSPHATE 10 MG/ML IJ SOLN
INTRAMUSCULAR | Status: DC | PRN
  Administered 2022-11-14: 4 mg via INTRAVENOUS

## 2022-11-14 MED ORDER — EPHEDRINE SULFATE (PRESSORS) 50 MG/ML IJ SOLN
INTRAMUSCULAR | Status: DC | PRN
  Administered 2022-11-14: 5 mg via INTRAVENOUS
  Administered 2022-11-14: 10 mg via INTRAVENOUS

## 2022-11-14 MED ORDER — PHENYLEPHRINE HCL-NACL 20-0.9 MG/250ML-% IV SOLN
INTRAVENOUS | Status: DC | PRN
  Administered 2022-11-14: 25 ug/min via INTRAVENOUS

## 2022-11-14 MED ORDER — PHENYLEPHRINE 80 MCG/ML (10ML) SYRINGE FOR IV PUSH (FOR BLOOD PRESSURE SUPPORT)
PREFILLED_SYRINGE | INTRAVENOUS | Status: AC
  Filled 2022-11-14: qty 10

## 2022-11-14 MED ORDER — MIDAZOLAM HCL 2 MG/2ML IJ SOLN
INTRAMUSCULAR | Status: AC
  Filled 2022-11-14: qty 2

## 2022-11-14 MED ORDER — ROCURONIUM BROMIDE 100 MG/10ML IV SOLN
INTRAVENOUS | Status: DC | PRN
  Administered 2022-11-14: 20 mg via INTRAVENOUS
  Administered 2022-11-14: 50 mg via INTRAVENOUS

## 2022-11-14 MED ORDER — FENTANYL CITRATE (PF) 100 MCG/2ML IJ SOLN
25.0000 ug | INTRAMUSCULAR | Status: DC | PRN
  Administered 2022-11-14 (×3): 25 ug via INTRAVENOUS

## 2022-11-14 MED ORDER — MIDAZOLAM HCL 2 MG/2ML IJ SOLN
INTRAMUSCULAR | Status: DC | PRN
  Administered 2022-11-14: 2 mg via INTRAVENOUS

## 2022-11-14 MED ORDER — PHENYLEPHRINE 80 MCG/ML (10ML) SYRINGE FOR IV PUSH (FOR BLOOD PRESSURE SUPPORT)
PREFILLED_SYRINGE | INTRAVENOUS | Status: DC | PRN
  Administered 2022-11-14: 160 ug via INTRAVENOUS

## 2022-11-14 MED ORDER — ORAL CARE MOUTH RINSE: 15.0000 mL | OROMUCOSAL | Status: DC | PRN

## 2022-11-14 MED ORDER — FENTANYL CITRATE PF 50 MCG/ML IJ SOSY: 12.5000 ug | PREFILLED_SYRINGE | Freq: Once | INTRAMUSCULAR | Status: DC | PRN

## 2022-11-14 MED ORDER — SUGAMMADEX SODIUM 200 MG/2ML IV SOLN
INTRAVENOUS | Status: DC | PRN
  Administered 2022-11-14: 217.8 mg via INTRAVENOUS

## 2022-11-14 MED ORDER — EPHEDRINE 5 MG/ML INJ
INTRAVENOUS | Status: AC
  Filled 2022-11-14: qty 5

## 2022-11-14 MED ORDER — LIDOCAINE HCL (CARDIAC) PF 100 MG/5ML IV SOSY
PREFILLED_SYRINGE | INTRAVENOUS | Status: DC | PRN
  Administered 2022-11-14: 80 mg via INTRAVENOUS

## 2022-11-14 MED ORDER — LIDOCAINE HCL (PF) 2 % IJ SOLN
INTRAMUSCULAR | Status: AC
  Filled 2022-11-14: qty 5

## 2022-11-14 MED ORDER — FENTANYL CITRATE (PF) 100 MCG/2ML IJ SOLN
INTRAMUSCULAR | Status: AC
  Filled 2022-11-14: qty 2

## 2022-11-14 MED ORDER — PROPOFOL 10 MG/ML IV BOLUS
INTRAVENOUS | Status: DC | PRN
  Administered 2022-11-14: 150 mg via INTRAVENOUS

## 2022-11-14 MED ORDER — ONDANSETRON HCL 4 MG/2ML IJ SOLN
INTRAMUSCULAR | Status: AC
  Filled 2022-11-14: qty 2

## 2022-11-14 MED ORDER — PROPOFOL 10 MG/ML IV BOLUS
INTRAVENOUS | Status: AC
  Filled 2022-11-14: qty 40

## 2022-11-14 MED ORDER — DEXAMETHASONE SODIUM PHOSPHATE 10 MG/ML IJ SOLN
INTRAMUSCULAR | Status: AC
  Filled 2022-11-14: qty 1

## 2022-11-14 MED ORDER — PHENYLEPHRINE HCL-NACL 20-0.9 MG/250ML-% IV SOLN
INTRAVENOUS | Status: AC
  Filled 2022-11-14: qty 250

## 2022-11-14 MED ORDER — ROCURONIUM BROMIDE 10 MG/ML (PF) SYRINGE
PREFILLED_SYRINGE | INTRAVENOUS | Status: AC
  Filled 2022-11-14: qty 10

## 2022-11-14 MED ORDER — FENTANYL CITRATE (PF) 100 MCG/2ML IJ SOLN
INTRAMUSCULAR | Status: DC | PRN
  Administered 2022-11-14: 100 ug via INTRAVENOUS

## 2022-11-14 MED ORDER — PROPOFOL 1000 MG/100ML IV EMUL
INTRAVENOUS | Status: AC
  Filled 2022-11-14: qty 100

## 2022-11-14 SURGICAL SUPPLY — 60 items
ADH SKN CLS APL DERMABOND .7 (GAUZE/BANDAGES/DRESSINGS) ×2
APL PRP STRL LF DISP 70% ISPRP (MISCELLANEOUS) ×1
BAG COUNTER SPONGE SURGICOUNT (BAG) ×2 IMPLANT
BAG SPNG CNTER NS LX DISP (BAG) ×1
BLADE SAGITTAL WIDE XTHICK NO (BLADE) ×2 IMPLANT
BLADE SURG SZ10 CARB STEEL (BLADE) ×2 IMPLANT
BNDG CMPR 5X4 CHSV STRCH STRL (GAUZE/BANDAGES/DRESSINGS) ×1
BNDG CMPR STD VLCR NS LF 5.8X4 (GAUZE/BANDAGES/DRESSINGS) ×1
BNDG COHESIVE 4X5 TAN STRL LF (GAUZE/BANDAGES/DRESSINGS) ×2 IMPLANT
BNDG ELASTIC 4X5.8 VLCR NS LF (GAUZE/BANDAGES/DRESSINGS) ×2 IMPLANT
BNDG GAUZE DERMACEA FLUFF 4 (GAUZE/BANDAGES/DRESSINGS) ×2 IMPLANT
BNDG GZE DERMACEA 4 6PLY (GAUZE/BANDAGES/DRESSINGS) ×1
BRUSH SCRUB EZ  4% CHG (MISCELLANEOUS) ×1
BRUSH SCRUB EZ 4% CHG (MISCELLANEOUS) ×2 IMPLANT
CANISTER WOUND CARE 500ML ATS (WOUND CARE) IMPLANT
CHLORAPREP W/TINT 26 (MISCELLANEOUS) ×2 IMPLANT
DERMABOND ADVANCED .7 DNX12 (GAUZE/BANDAGES/DRESSINGS) ×4 IMPLANT
DRAPE FOR WOUND VAC UNIT (DRAPES) IMPLANT
DRAPE INCISE IOBAN 66X45 STRL (DRAPES) ×2 IMPLANT
DRSG GAUZE FLUFF 36X18 (GAUZE/BANDAGES/DRESSINGS) ×2 IMPLANT
DRSG VAC ATS MED SENSATRAC (GAUZE/BANDAGES/DRESSINGS) IMPLANT
ELECT CAUTERY BLADE 6.4 (BLADE) ×2 IMPLANT
ELECT REM PT RETURN 9FT ADLT (ELECTROSURGICAL) ×1
ELECTRODE REM PT RTRN 9FT ADLT (ELECTROSURGICAL) ×2 IMPLANT
GLOVE BIO SURGEON STRL SZ7 (GLOVE) ×4 IMPLANT
GLOVE SURG SYN 8.0 (GLOVE) ×1 IMPLANT
GLOVE SURG SYN 8.0 PF PI (GLOVE) ×2 IMPLANT
GLOVE SURG UNDER LTX SZ7.5 (GLOVE) ×2 IMPLANT
GOWN STRL REUS W/ TWL LRG LVL3 (GOWN DISPOSABLE) ×4 IMPLANT
GOWN STRL REUS W/ TWL XL LVL3 (GOWN DISPOSABLE) ×2 IMPLANT
GOWN STRL REUS W/TWL LRG LVL3 (GOWN DISPOSABLE) ×2
GOWN STRL REUS W/TWL XL LVL3 (GOWN DISPOSABLE) ×1
HANDLE YANKAUER SUCT BULB TIP (MISCELLANEOUS) ×2 IMPLANT
KIT TURNOVER KIT A (KITS) ×2 IMPLANT
LABEL OR SOLS (LABEL) ×2 IMPLANT
MANIFOLD NEPTUNE II (INSTRUMENTS) ×2 IMPLANT
NS IRRIG 500ML POUR BTL (IV SOLUTION) ×2 IMPLANT
PACK EXTREMITY ARMC (MISCELLANEOUS) ×2 IMPLANT
PAD ABD DERMACEA PRESS 5X9 (GAUZE/BANDAGES/DRESSINGS) ×2 IMPLANT
PAD PREP 24X41 OB/GYN DISP (PERSONAL CARE ITEMS) ×2 IMPLANT
SPONGE T-LAP 18X18 ~~LOC~~+RFID (SPONGE) ×4 IMPLANT
STAPLER SKIN PROX 35W (STAPLE) IMPLANT
STOCKINETTE M/LG 89821 (MISCELLANEOUS) ×2 IMPLANT
SUT ETHILON 3 0 FSLX (SUTURE) IMPLANT
SUT MNCRL 4-0 (SUTURE) ×1
SUT MNCRL 4-0 27XMFL (SUTURE) ×1
SUT SILK 2 0 (SUTURE) ×1
SUT SILK 2-0 18XBRD TIE 12 (SUTURE) ×2 IMPLANT
SUT SILK 3 0 (SUTURE) ×1
SUT SILK 3-0 18XBRD TIE 12 (SUTURE) ×2 IMPLANT
SUT VIC AB 0 CT1 36 (SUTURE) ×8 IMPLANT
SUT VIC AB 3-0 SH 27 (SUTURE) ×2
SUT VIC AB 3-0 SH 27X BRD (SUTURE) ×4 IMPLANT
SUT VICRYL PLUS ABS 0 54 (SUTURE) ×2 IMPLANT
SUTURE MNCRL 4-0 27XMF (SUTURE) ×2 IMPLANT
SYR 20ML LL LF (SYRINGE) ×4 IMPLANT
TAPE UMBILICAL 1/8 X36 TWILL (MISCELLANEOUS) ×2 IMPLANT
TOWEL OR 17X26 4PK STRL BLUE (TOWEL DISPOSABLE) ×4 IMPLANT
TRAP FLUID SMOKE EVACUATOR (MISCELLANEOUS) ×2 IMPLANT
WATER STERILE IRR 500ML POUR (IV SOLUTION) ×2 IMPLANT

## 2022-11-14 NOTE — Progress Notes (Signed)
OT Cancellation Note  Patient Details Name: Shane Chambers MRN: [id number] DOB: 1961/03/11   Cancelled Treatment:    Reason Eval/Treat Not Completed: Patient at procedure or test/ unavailable. Pt currently in surgery for BKA revision/washout. Will hold for today.  Vania Rea 11/14/2022, 9:07 AM

## 2022-11-14 NOTE — Progress Notes (Signed)
[facility] Progress Note  Patient: Shane Chambers    NFA:[id number]  DOA: 11/11/2022     Date of Service: the patient was seen and examined on 11/14/2022  No chief complaint on file.  Brief hospital course:  Shane Chambers is a 62 y.o. male with past medical history of CAD s/p CABG, HFrEF with recovered EF, PVD, type 2 diabetes, COPD, hypertension, hyperlipidemia, hypothyroidism, depression who is current he admitted for suicidal ideation and found to have complication of his amputation site.  Shane Chambers states that for the last 3 days, he has been experiencing increasing pain of his right BKA stump in addition to increasing erythema.  His nurse at bedside states that he has been picking at it. Patient has not noticed any purulent drainage at this time but there are areas where he is worried that the incision is opening. Per chart review, patient was admitted on 11/23 for cellulitis of the right lower extremity extending from the foot up towards the knee.  CT imaging at that time demonstrated holdable complex fluid collections and possible medial malleolus osteomyelitis.  MRI was subsequently obtained that demonstrated right ankle septic arthritis.  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  He followed up with orthopedic surgery on 12/14.  At that time, he was instructed to follow-up in 1 week for staple removal.   Assessment and Plan:  * Acute osteomyelitis ([place]) Initially patient was seen in consultation at [facility], however required transfer to the medical unit due to inability to administer IV antibiotics.  Patient presented with 3 days of gradually worsening right stump erythema and pain.  Initial concern for cellulitis or only, however vascular surgery was able to put a probe down to the bone with 200 cc of purulent drainage, more consistent with osteomyelitis. Inflammatory markers are elevated. Vascular surgery consulted, s/p revision of right BKA stump, washout of Abscess in the  posterior portion of the below-knee amputation stump, Deep wound culture to microbiology  - Continue broad-spectrum coverage with vancomycin, ceftriaxone, and metronidazole  - Tylenol as needed for fever - If patient should develop fever, please obtain blood cultures changed oxycodone 5 mg p.o. every 4 hourly as needed WBC count trending down Wound care consulted  Hypomagnesemia, mag repleted.  Resolved Monitor and replete as needed.  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  EKG today with improved QTc.    Hypertension, continue amlodipine, Lopressor HLD, continue statin  Depression - Continue SSRIs and antipsychotics started on recent [facility] admission  Continue Abilify and Effexor  Type 2 diabetes mellitus (HCC) - SSI, moderate - Hold home metformin  Hypothyroid, continued Synthroid Constipation, continue started MiraLAX and Dulcolax nightly.  Discussed the dose if no constipation BPH, continue Flomax Vitamin D insufficiency: started vitamin D 50,000 units p.o. weekly, follow with PCP to repeat vitamin D level after 3 to 6 months.   There is no height or weight on file to calculate BMI.  Interventions:    Pressure Injury 11/06/22 Buttocks Right Stage 2 -  Partial thickness loss of dermis presenting as a shallow open injury with a red, pink wound bed without slough. no odor. no drainage. no slough. 1cm X 0.3cm (Active)  11/06/22 1800  Location: Buttocks  Location Orientation: Right  Staging: Stage 2 -  Partial thickness loss of dermis presenting as a shallow open injury with a red, pink wound bed without slough.  Wound Description (Comments): no odor. no drainage. no  slough. 1cm X 0.3cm  Present on Admission:      Diet: Heart healthy/carb modified DVT Prophylaxis: Subcutaneous Lovenox   Advance goals of care discussion: Full code  Family Communication: family was NOT present at bedside, at the time of interview.  The pt provided  permission to discuss medical plan with the family. Opportunity was given to ask question and all questions were answered satisfactorily.   Disposition:  Pt is from Home, admitted with right BKA infection and depression with SI/HI ideations under BHU, still has infection, on IV antibiotics, s/p right BKA stump washout done on 1/5, wound culture pending, which precludes a safe discharge. Discharge to [facility] vs SNF, when cleared by vascular surgery.  Subjective: No significant events overnight, seen after washout of BKA stump, pain 6/10, denies any other active issues.  Physical Exam: General:  alert oriented to time, place, and person.  Appear in no distress, affect appropriate Eyes: PERRLA ENT: Oral Mucosa Clear, moist  Neck: no JVD,  Cardiovascular: S1 and S2 Present, no Murmur,  Respiratory: good respiratory effort, Bilateral Air entry equal and Decreased, no Crackles, no wheezes Abdomen: Bowel Sound present, Soft and no tenderness,  Skin: no rashes Extremities: no Pedal edema, no calf tenderness, s/p Right BKA stump washout, wound VAC attached. Neurologic: without any new focal findings Gait not checked due to patient safety concerns  Vitals:   11/14/22 1300 11/14/22 1311 11/14/22 1403 11/14/22 1446  BP: 120/70 120/64 126/70 137/65  Pulse: (!) 59 (!) 58 (!) 58 63  Resp: '14 18 18 18  '$ Temp:  98.1 F (36.7 C) 97.9 F (36.6 C) 98.8 F (37.1 C)  TempSrc:  Oral Oral Oral  SpO2: 97% 97% 99% 97%  Weight:        Intake/Output Summary (Last 24 hours) at 11/14/2022 1655 Last data filed at 11/14/2022 1528 Gross per 24 hour  Intake 2256.68 ml  Output 2530 ml  Net -273.32 ml   Filed Weights   11/14/22 0442  Weight: 108.9 kg    Data Reviewed: I have personally reviewed and interpreted daily labs, tele strips, imagings as discussed above. I reviewed all nursing notes, pharmacy notes, vitals, pertinent old records I have discussed plan of care as described above with RN and  patient/family.  CBC: Recent Labs  Lab 11/10/22 1916 11/11/22 0616 11/12/22 0610 11/13/22 0857 11/14/22 0552  WBC 12.7* 12.7* 10.8* 9.9 9.5  NEUTROABS 9.3*  --   --   --   --   HGB 9.1* 8.5* 8.5* 8.2* 8.3*  HCT 28.5* 26.8* 26.5* 25.2* 26.4*  MCV 90.8 91.2 90.1 90.0 90.7  PLT 303 306 322 288 166   Basic Metabolic Panel: Recent Labs  Lab 11/10/22 1916 11/11/22 0616 11/12/22 0610 11/13/22 0857 11/14/22 0552  NA 136 137 135 133* 133*  K 4.1 4.0 4.1 4.0 4.2  CL 100 103 101 99 100  CO2 '24 25 24 26 25  '$ GLUCOSE 181* 93 87 92 109*  BUN 38* 33* 35* 49* 63*  CREATININE 1.21 1.03 1.15 1.34* 1.30*  CALCIUM 8.9 8.9 8.7* 8.3* 8.7*  MG  --  1.4* 1.7 1.6* 1.7  PHOS  --  3.5 4.3 5.2* 4.6    Studies: No results found.  Scheduled Meds:  amLODipine  10 mg Oral Daily   ARIPiprazole  10 mg Oral Daily   vitamin C  500 mg Oral BID   aspirin EC  81 mg Oral Q breakfast   atorvastatin  80 mg Oral QHS  bisacodyl  10 mg Oral QHS   clotrimazole   Topical BID   enoxaparin (LOVENOX) injection  0.5 mg/kg Subcutaneous Q24H   ferrous gluconate  324 mg Oral Daily   fluticasone furoate-vilanterol  1 puff Inhalation Daily   insulin aspart  0-15 Units Subcutaneous TID AC & HS   levothyroxine  75 mcg Oral Q0600   magnesium oxide  400 mg Oral Daily   metoprolol tartrate  50 mg Oral BID   multivitamin with minerals  1 tablet Oral Daily   pantoprazole  40 mg Oral Daily   polyethylene glycol  17 g Oral BID   polyvinyl alcohol  1 drop Both Eyes Daily   potassium chloride SA  20 mEq Oral Daily   Ensure Max Protein  11 oz Oral TID   sodium chloride flush  3 mL Intravenous Q12H   tamsulosin  0.8 mg Oral Daily   thiamine  100 mg Oral Daily   torsemide  40 mg Oral Daily   venlafaxine XR  150 mg Oral Q breakfast   Vitamin D (Ergocalciferol)  50,000 Units Oral Q7 days   zinc sulfate  220 mg Oral Daily   Continuous Infusions:  cefTRIAXone (ROCEPHIN)  IV Stopped (11/14/22 0450)   metronidazole 500 mg  (11/14/22 1645)   vancomycin Stopped (11/13/22 2111)   PRN Meds: acetaminophen, albuterol, fentaNYL (SUBLIMAZE) injection, hydrALAZINE, HYDROmorphone (DILAUDID) injection, ipratropium-albuterol, loperamide HCl, ondansetron (ZOFRAN) IV, mouth rinse, oxyCODONE  Time spent: 35 minutes  Author: Val Riles. MD [facility] 11/14/2022 4:55 PM  To reach On-call, see care teams to locate the attending and reach out to them via [email or web address]. If 7PM-7AM, please contact night-coverage If you still have difficulty reaching the attending provider, please page the [facility] (Director on Call) for [facility] on amion for assistance.

## 2022-11-14 NOTE — Transfer of Care (Signed)
Immediate Anesthesia Transfer of Care Note  Patient: Shane Chambers  Procedure(s) Performed: AMPUTATION BELOW KNEE REVISION; WASHOUT (Right: Knee)  Patient Location: PACU  Anesthesia Type:General  Level of Consciousness: awake, alert , and oriented  Airway & Oxygen Therapy: Patient Spontanous Breathing and Patient connected to face mask oxygen  Post-op Assessment: Report given to RN and Post -op Vital signs reviewed and stable  Post vital signs: stable  Last Vitals:  Vitals Value Taken Time  BP 157/68 11/14/22 1205  Temp    Pulse 72 11/14/22 1207  Resp 15 11/14/22 1207  SpO2 97 % 11/14/22 1207  Vitals shown include unvalidated device data.  Last Pain:  Vitals:   11/14/22 0909  TempSrc: Temporal  PainSc: 5       Patients Stated Pain Goal: 0 (73/22/56 7209)  Complications: No notable events documented.

## 2022-11-14 NOTE — Interval H&P Note (Signed)
History and Physical Interval Note:  11/14/2022 8:14 AM  Shane Chambers  has presented today for surgery, with the diagnosis of Vascular disease.  The various methods of treatment have been discussed with the patient and family. After consideration of risks, benefits and other options for treatment, the patient has consented to  Procedure(s): AMPUTATION BELOW KNEE REVISION; WASHOUT (Right) as a surgical intervention.  The patient's history has been reviewed, patient examined, no change in status, stable for surgery.  I have reviewed the patient's chart and labs.  Questions were answered to the patient's satisfaction.     Hortencia Pilar

## 2022-11-14 NOTE — TOC Progression Note (Addendum)
Transition of Care [facility]) - Progression Note    Patient Details  Name: Shane Chambers MRN: [id number] Date of Birth: 03-06-1961  Transition of [facility]) CM/SW [place], LCSW Phone Number: 11/14/2022, 9:49 AM  Clinical Narrative: [facility] confirmed they cannot bill [employer].    3:53 pm: Notified [facility] that patient now has wound vac.  Expected Discharge Plan and Services                                               Social Determinants of Health (SDOH) Interventions SDOH Screenings   Food Insecurity: No Food Insecurity (11/13/2022)  Housing: Low Risk  (11/13/2022)  Transportation Needs: No Transportation Needs (11/13/2022)  Utilities: Not At Risk (11/13/2022)  Alcohol Screen: Low Risk  (11/05/2022)  Tobacco Use: Medium Risk (11/14/2022)    Readmission Risk Interventions    11/11/2022    2:05 PM 10/03/2022   10:08 AM 09/26/2022    1:16 PM  Readmission Risk Prevention Plan  Transportation Screening Complete Complete Complete  PCP or Specialist Appt within 3-5 Days   Complete  HRI or [place]   Complete  Social Work Consult for [place] Planning/Counseling   Complete  Palliative Care Screening   Not Applicable  Medication Review [employer])   Complete  HRI or Home Care Consult Complete Complete   SW Recovery Care/Counseling Consult Complete Complete   Palliative Care Screening Not Applicable Not Applicable   Skilled Nursing Facility Complete Complete

## 2022-11-14 NOTE — Op Note (Signed)
OPERATIVE NOTE   PROCEDURE:  Excisional debridement of right below-knee amputation stump with drainage of abscess. 2.  Application of wound VAC to below-knee amputation stump.  PRE-OPERATIVE DIAGNOSIS: Abscess right below-knee amputation stump  POST-OPERATIVE DIAGNOSIS: Same  SURGEON: Belenda Cruise Kanisha Duba  ASSISTANT(S): Annalee Genta, NP  ANESTHESIA: general  ESTIMATED BLOOD LOSS: 15 cc  FINDING(S): Abscess in the posterior portion of the below-knee amputation stump  SPECIMEN(S): Deep wound culture to microbiology  INDICATIONS:   Shane Chambers is a 62 y.o. male who presents with infected right below-knee amputation stump which is draining fluid.  Exploration of the wound at the bedside demonstrated the cavity appears to go to the level of the bone.  Given these findings exploration in the operating room with drainage of the abscess and debridement were in order.  Risks and benefits were reviewed all questions answered patient agrees to proceed.  DESCRIPTION: After full informed written consent was obtained from the patient, the patient was brought back to the operating room and placed supine upon the operating table.  Prior to induction, the patient received IV antibiotics.   After obtaining adequate anesthesia, the patient was then prepped and draped in the standard fashion for a debridement of the right below-knee amputation stump.  The incisional scar was then reopened from the ulcer in the medial corner all the way to the lateral corner.  Once I incised the skin and subcutaneous tissues I would greeted with approximately 10 cc to 15 cc of murky fluid.  A wound culture was then obtained in the deepest portion of the wound and passed off the field for microbiology.  Having mobilized the posterior flap the wound was then debrided with Metzenbaum scissors as well as using Bovie cautery.  Devitalized tissue was excised.  Hemostasis was then obtained with Bovie cautery.  The stump was  then irrigated with 2 L of normal saline.  The wound was then inspected for the most part there was healthy appearing granulation tissue covering the tibia.  One very small area anterior lateral of tibial bone was exposed but this was hard and did not appear to be infected clinically.  The internal surface of the posterior flap was also richly granulated for the most part.  There was a pocket that tract laterally.  All devitalized tissue in this pocket was excised using Metzenbaum scissors.  Material included muscle fibers tendon and subcutaneous tissues.  The wound was then measured and found to be 12 cm in length by 5 cm in width and 4 cm in depth at its deepest point.  Surface area represents 60 cm.  3-0 Vicryl in a figure-of-eight fashion was then used to reapproximate the deeper tissues in the lateral aspect up to the midline.  A total of four 3-0 nylon interrupted vertical mattress sutures were then used to close the skin from the lateral aspect to the midline completely covering the tibia.  This left a gap in from the midline anteriorly to the lateral aspect which included the above-noted pocket.  A VAC sponge was then delivered to the field and trimmed to fill the pocket up to the midline and then a small strip was placed across the suture line.  Adhesive dressing was then applied and an excellent seal with no leak was obtained in the operating room before taking down the sterile field.   The patient tolerated this procedure well.   COMPLICATIONS: None  CONDITION: Good   Hortencia Pilar [facility]  Office: [phone]   11/14/2022, 12:10 PM

## 2022-11-14 NOTE — Progress Notes (Signed)
PT Cancellation Note  Patient Details Name: Shane Chambers MRN: [id number] DOB: 1961/01/31   Cancelled Treatment:    Reason Eval/Treat Not Completed: Other (comment): Per chart review pt currently in surgery for BKA revision/washout. Will hold PT today and attempt to see pt at a future date/time as medically appropriate.     Linus Salmons PT, DPT 11/14/22, 10:34 AM

## 2022-11-14 NOTE — Anesthesia Procedure Notes (Signed)
Procedure Name: Intubation Date/Time: 11/14/2022 10:53 AM  Performed by: Jacqualin Combes, CRNAPre-anesthesia Checklist: Patient identified, Emergency Drugs available, Suction available and Patient being monitored Patient Re-evaluated:Patient Re-evaluated prior to induction Oxygen Delivery Method: Circle system utilized Preoxygenation: Pre-oxygenation with 100% oxygen Induction Type: IV induction Ventilation: Mask ventilation without difficulty, Two handed mask ventilation required and Oral airway inserted - appropriate to patient size Laryngoscope Size: Mac and 4 Grade View: Grade I Tube type: Oral Tube size: 7.5 mm Number of attempts: 1 Airway Equipment and Method: Stylet and Oral airway Placement Confirmation: ETT inserted through vocal cords under direct vision, positive ETCO2 and breath sounds checked- equal and bilateral Secured at: 22 cm Tube secured with: Tape Dental Injury: Teeth and Oropharynx as per pre-operative assessment  Comments: Cords clear; 2 person mask with OPA for adequate seal due to full beard. CA

## 2022-11-14 NOTE — Progress Notes (Signed)
Follow up visit with this patient who requested to speak with chaplain himself. Patient was given space to share his concerns which were more spiritually centered. I shared scripture and words of encouragement and prayer during this visit. Patient is scheduled for surgery and would like additional chaplain visits.

## 2022-11-14 NOTE — Anesthesia Preprocedure Evaluation (Addendum)
Anesthesia Evaluation  Patient identified by MRN, date of birth, ID band Patient awake    Reviewed: Allergy & Precautions, NPO status , Patient's Chart, lab work & pertinent test results  Airway Mallampati: III  TM Distance: >3 FB Neck ROM: full    Dental  (+) Missing, Poor Dentition   Pulmonary sleep apnea , COPD, former smoker   Pulmonary exam normal  + decreased breath sounds      Cardiovascular Exercise Tolerance: Poor hypertension, Pt. on medications + CAD, + Past MI and +CHF  Normal cardiovascular exam Rhythm:Regular Rate:Normal     Neuro/Psych    Depression    negative neurological ROS  negative psych ROS   GI/Hepatic negative GI ROS, Neg liver ROS,,,  Endo/Other  diabetesHypothyroidism    Renal/GU      Musculoskeletal   Abdominal  (+) + obese  Peds negative pediatric ROS (+)  Hematology negative hematology ROS (+)   Anesthesia Other Findings Past Medical History: No date: Anemia No date: Arthritis No date: CAD (coronary artery disease)     Comment:  a. s/p CABG in 03/2020 with LIMA-LAD, RIMA-PL,               [id number] No date: Cancer (HCC)     Comment:  rectal No date: Cellulitis No date: COPD (chronic obstructive pulmonary disease) (HCC) No date: Depression No date: Diabetes mellitus without complication (HCC) No date: GERD (gastroesophageal reflux disease) No date: History of kidney stones 12/01/2019: Hyperlipidemia No date: Hypertension No date: Hypothyroidism No date: Ischemic cardiomyopathy     Comment:  a. EF 20-25% by echo in 02/2020 b. at 40% by echo in               03/2020 c. EF normalized to 60-65% by echo in 04/2020 No date: Myocardial infarction [facility]) No date: Peripheral vascular disease ([place]) No date: Sleep apnea No date: Type 2 diabetes mellitus ([place])  Past Surgical History: 10/05/2022: AMPUTATION; Right     Comment:  Procedure: AMPUTATION BELOW KNEE;  Surgeon: Newt Minion, MD;  Location: [facility];  Service: Orthopedics;                Laterality: Right; 75/64/3329: APPLICATION OF WOUND VAC; Right     Comment:  Procedure: APPLICATION OF WOUND VAC;  Surgeon: Newt Minion, MD;  Location: [facility];  Service: Orthopedics;                Laterality: Right; 07/18/2021: BIOPSY     Comment:  Procedure: BIOPSY;  Surgeon: Eloise Harman, DO;                [facility];  Service: Endoscopy;; 01/09/2022: BIOPSY     Comment:  Procedure: BIOPSY;  Surgeon: Eloise Harman, DO;                [facility];  Service: Endoscopy;; 07/18/2021: COLONOSCOPY WITH PROPOFOL; N/A     Comment:  [place]: 15 millimeter polyp removed from the sigmoid               colon, 5 mm polyp removed from sigmoid colon.                Nonbleeding internal hemorrhoids.  Significant looping of  the colon. 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 03/13/2020: CORONARY ARTERY BYPASS GRAFT; N/A     Comment:  Procedure: CORONARY ARTERY BYPASS GRAFTING (CABG) times               five using bilateral Internal mammary arteries and left               radial artery;  Surgeon: AtWonda OldsMD;                [facility] Service: [facility] Surgery;                Laterality: N/A; No date: DENTAL SURGERY 07/18/2021: ESOPHAGOGASTRODUODENOSCOPY (EGD) WITH PROPOFOL; N/A     Comment:  [place]: 1 gastric polyp status post biopsy, gastritis.               gastric bx with slight chronic inflammation and no               H.pyori. GEJ polypectomy with mild inflammation only 08/26/2021: FLEXIBLE SIGMOIDOSCOPY; N/A     Comment:  [place]: Nonbleeding internal hemorrhoids.  15 mm ulcers               from previous polypectomy found  in the rectum.  No               evidence of previous polyp.  Located 5 to 8 cm from anal               verge. 01/09/2022: FLEXIBLE SIGMOIDOSCOPY; N/A     Comment:  Procedure: FLEXIBLE SIGMOIDOSCOPY;  Surgeon: CaEloise HarmanDO;  [facility];  Service:               Endoscopy;  Laterality: N/A; 07/18/2021: POLYPECTOMY     Comment:  Procedure: POLYPECTOMY INTESTINAL;  Surgeon: CaEloise HarmanDO;  [facility];  Service:               Endoscopy;; 03/13/2020: RADIAL ARTERY HARVEST; Left     Comment:  Procedure: [place]  Surgeon: AtWonda OldsMD;  [place] [facility] Service: Open Heart               Surgery;  Laterality: Left; 03/07/2020: RIGHT/LEFT HEART CATH AND CORONARY ANGIOGRAPHY; N/A     Comment:  Procedure: RIGHT/LEFT HEART CATH AND CORONARY               ANGIOGRAPHY;  Surgeon: CoSherren MochaMD;  [facility];  Service: Cardiovascular;  Laterality:               N/A; 01/09/2022: SUBMUCOSAL LIFTING INJECTION     Comment:  Procedure: SUBMUCOSAL LIFTING  INJECTION;  Surgeon:               Eloise Harman, DO;  [facility];                Service: Endoscopy;; 01/09/2022: SUBMUCOSAL TATTOO INJECTION     Comment:  Procedure: SUBMUCOSAL TATTOO INJECTION;  Surgeon:               Eloise Harman, DO;  [facility];                Service: Endoscopy;; 03/13/2020: TEE WITHOUT CARDIOVERSION; N/A     Comment:  Procedure: TRANSESOPHAGEAL ECHOCARDIOGRAM (TEE);                Surgeon: [name], MD;  Location: [facility];                Service: Open Heart Surgery;  Laterality: N/A;  BMI    Body Mass Index: 35.45 kg/m      Reproductive/Obstetrics negative OB ROS                              Anesthesia Physical Anesthesia Plan  ASA: 3  Anesthesia Plan: General   Post-op Pain Management:    Induction:  Intravenous  PONV Risk Score and Plan: Ondansetron, Dexamethasone, Midazolam and Treatment may vary due to age or medical condition  Airway Management Planned: Oral ETT  Additional Equipment:   Intra-op Plan:   Post-operative Plan: Extubation in OR  Informed Consent: I have reviewed the patients History and Physical, chart, labs and discussed the procedure including the risks, benefits and alternatives for the proposed anesthesia with the patient or authorized representative who has indicated his/her understanding and acceptance.     Dental Advisory Given  Plan Discussed with: CRNA and Surgeon  Anesthesia Plan Comments:         Anesthesia Quick Evaluation

## 2022-11-14 NOTE — Progress Notes (Signed)
Patient returned form the OR

## 2022-11-14 NOTE — Consult Note (Addendum)
Pharmacy Antibiotic Note  Shane Chambers is a 62 y.o. male w/ PMH of BPH, DM, CAD, CABG, CHF, HTN, hypothyroidism, depression, BKA admitted on 11/11/2022 with  right below-knee amputation infection .  Pharmacy has been consulted for vancomycin dosing.  Plan: Continue Vancomycin 750 mg IV every 12 hours Estimated AUC 499.2, Cmin 15.5 Patient had surgery today and missed dose that levels were ordered around, recommend getting levels over weekend. Follow levels for dosing adjustments  Weight: 108.9 kg (240 lb 1.3 oz) Temp (24hrs), Avg:97.9 F (36.6 C), Min:97.4 F (36.3 C), Max:98.2 F (36.8 C)  Recent Labs  Lab 11/10/22 1916 11/11/22 0616 11/12/22 0610 11/13/22 0857 11/14/22 0552  WBC 12.7* 12.7* 10.8* 9.9 9.5  CREATININE 1.21 1.03 1.15 1.34* 1.30*     Estimated Creatinine Clearance: 74.4 mL/min (A) (by C-G formula based on SCr of 1.3 mg/dL (H)).    Allergies  Allergen Reactions   Ace Inhibitors Swelling and Cough    (Not on MAR at [facility])   Haloperidol And Related     Do NOT give anti-psychotics due to risk of  Torsades and QT prolongation (per Dr. Graciela Husbands request)   Other Itching    [name]   Shellfish Allergy    Penicillins Itching and Rash    Has patient had a PCN reaction causing immediate rash, facial/tongue/throat swelling, SOB or lightheadedness with hypotension: Unknown Has patient had a PCN reaction causing severe rash involving mucus membranes or skin necrosis: Unknown Has patient had a PCN reaction that required hospitalization: Unknown Has patient had a PCN reaction occurring within the last 10 years: Unknown If all of the above answers are "NO", then may proceed with Cephalosporin use.     Antimicrobials this admission: 1/1 vancomycin >>  1/1 metronidazole >> 1/1 CTX >>  Microbiology results: N/A  Thank you for allowing pharmacy to be a part of this patient's care.  Lorin Picket, PharmD 11/14/2022 7:48  AM

## 2022-11-14 NOTE — Progress Notes (Signed)
Patient transferred to the OR

## 2022-11-15 DIAGNOSIS — M8610 Other acute osteomyelitis, unspecified site: Secondary | ICD-10-CM | POA: Diagnosis not present

## 2022-11-15 LAB — BASIC METABOLIC PANEL
Anion gap: 11 (ref 5–15)
BUN: 61 mg/dL — ABNORMAL HIGH (ref 6–20)
CO2: 22 mmol/L (ref 22–32)
Calcium: 9 mg/dL (ref 8.9–10.3)
Chloride: 102 mmol/L (ref 98–111)
Creatinine, Ser: 1.26 mg/dL — ABNORMAL HIGH (ref 0.61–1.24)
GFR, Estimated: >60 mL/min (ref >60)
Glucose, Bld: 157 mg/dL — ABNORMAL HIGH (ref 70–99)
Potassium: 4.3 mmol/L (ref 3.5–5.1)
Sodium: 135 mmol/L (ref 135–145)

## 2022-11-15 LAB — GLUCOSE, CAPILLARY
Glucose-Capillary: 121 mg/dL — ABNORMAL HIGH (ref 70–99)
Glucose-Capillary: 133 mg/dL — ABNORMAL HIGH (ref 70–99)
Glucose-Capillary: 158 mg/dL — ABNORMAL HIGH (ref 70–99)
Glucose-Capillary: 129 mg/dL — ABNORMAL HIGH (ref 70–99)

## 2022-11-15 LAB — CBC
HCT: 24.8 % — ABNORMAL LOW (ref 39.0–52.0)
Hemoglobin: 8 g/dL — ABNORMAL LOW (ref 13.0–17.0)
MCH: 28.9 pg (ref 26.0–34.0)
MCHC: 32.3 g/dL (ref 30.0–36.0)
MCV: 89.5 fL (ref 80.0–100.0)
Platelets: 333 10*3/uL (ref 150–400)
RBC: 2.77 MIL/uL — ABNORMAL LOW (ref 4.22–5.81)
RDW: 15.1 % (ref 11.5–15.5)
WBC: 8.3 10*3/uL (ref 4.0–10.5)
nRBC: 0 % (ref 0.0–0.2)

## 2022-11-15 MED ORDER — GABAPENTIN 300 MG PO CAPS
600.0000 mg | ORAL_CAPSULE | Freq: Two times a day (BID) | ORAL | Status: DC
  Administered 2022-11-15 – 2022-11-28 (×27): 600 mg via ORAL
  Filled 2022-11-15 (×27): qty 2

## 2022-11-15 MED ORDER — MINERAL OIL RE ENEM: 1.0000 | ENEMA | Freq: Once | RECTAL | Status: DC

## 2022-11-15 MED ORDER — GABAPENTIN 300 MG PO CAPS: 300.0000 mg | ORAL_CAPSULE | Freq: Three times a day (TID) | ORAL | Status: DC

## 2022-11-15 MED ORDER — BISACODYL 10 MG RE SUPP
10.0000 mg | Freq: Once | RECTAL | Status: AC
  Administered 2022-11-15: 10 mg via RECTAL
  Filled 2022-11-15: qty 1

## 2022-11-15 NOTE — Progress Notes (Signed)
A consult was placed to the hospital's IV Nurse for new iv access;  pt well known to the IVTeam due to several calls to place new iv sites;  Pt with very poor veins, scar tissue;  upon entering the room and after assessing with ultrasound, the pt stated "you're the 3rd IV nurse who has said I needed a PICC line."  Pt on multiple antibiotics;  was able to place a PIV on the 2nd attempt but strongly recommend a PICC for him.

## 2022-11-15 NOTE — Consult Note (Addendum)
Pharmacy Antibiotic Note  Shane Chambers is a 62 y.o. male w/ PMH of BPH, DM, CAD, CABG, CHF, HTN, hypothyroidism, depression, BKA admitted on 11/11/2022 with  right below-knee amputation infection .  Pharmacy has been consulted for vancomycin dosing.  Plan: Continue Vancomycin 750 mg IV every 12 hours Estimated AUC 499.2, Cmin 15.5 Patient had surgery today and missed dose that levels were ordered around, recommend getting levels over weekend. Follow levels for dosing adjustments   1/6:  Pt lost IV access so VP and VT will be retimed to reflect new dosing schedule   1/6:  Vanc 750 mg IV given on 1/6 @ 2221.         Vanc peak on 1/7 @ 0030         Vanc trough on 1/7 @ 1000  Weight: 108.9 kg (240 lb 1.3 oz) Temp (24hrs), Avg:97.9 F (36.6 C), Min:97.8 F (36.6 C), Max:98 F (36.7 C)  Recent Labs  Lab 11/11/22 0616 11/12/22 0610 11/13/22 0857 11/14/22 0552 11/15/22 0454  WBC 12.7* 10.8* 9.9 9.5 8.3  CREATININE 1.03 1.15 1.34* 1.30* 1.26*     Estimated Creatinine Clearance: 76.8 mL/min (A) (by C-G formula based on SCr of 1.26 mg/dL (H)).    Allergies  Allergen Reactions   Ace Inhibitors Swelling and Cough    (Not on MAR at [facility])   Haloperidol And Related     Do NOT give anti-psychotics due to risk of  Torsades and QT prolongation (per Dr. Graciela Husbands request)   Other Itching    [name]   Shellfish Allergy    Penicillins Itching and Rash    Has patient had a PCN reaction causing immediate rash, facial/tongue/throat swelling, SOB or lightheadedness with hypotension: Unknown Has patient had a PCN reaction causing severe rash involving mucus membranes or skin necrosis: Unknown Has patient had a PCN reaction that required hospitalization: Unknown Has patient had a PCN reaction occurring within the last 10 years: Unknown If all of the above answers are "NO", then may proceed with Cephalosporin use.     Antimicrobials this  admission: 1/1 vancomycin >>  1/1 metronidazole >> 1/1 CTX >>  Microbiology results: N/A  Thank you for allowing pharmacy to be a part of this patient's care.  Westen Dinino D, PharmD 11/15/2022 9:52 PM

## 2022-11-15 NOTE — Progress Notes (Signed)
[facility] Progress Note  Patient: Shane Chambers    IOE:[id number]  DOA: 11/11/2022     Date of Service: the patient was seen and examined on 11/15/2022  No chief complaint on file.  Brief hospital course:  Shane Chambers is a 62 y.o. male with past medical history of CAD s/p CABG, HFrEF with recovered EF, PVD, type 2 diabetes, COPD, hypertension, hyperlipidemia, hypothyroidism, depression who is current he admitted for suicidal ideation and found to have complication of his amputation site.  Shane Chambers states that for the last 3 days, he has been experiencing increasing pain of his right BKA stump in addition to increasing erythema.  His nurse at bedside states that he has been picking at it. Patient has not noticed any purulent drainage at this time but there are areas where he is worried that the incision is opening. Per chart review, patient was admitted on 11/23 for cellulitis of the right lower extremity extending from the foot up towards the knee.  CT imaging at that time demonstrated holdable complex fluid collections and possible medial malleolus osteomyelitis.  MRI was subsequently obtained that demonstrated right ankle septic arthritis.  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  He followed up with orthopedic surgery on 12/14.  At that time, he was instructed to follow-up in 1 week for staple removal.   Assessment and Plan:  Right BKA stump abscess, s/p revision and washout done on 11/14/2022 H/o Acute osteomyelitis, s/p right BKA Initially patient was seen in consultation at [facility], however required transfer to the medical unit due to inability to administer IV antibiotics.  Patient presented with 3 days of gradually worsening right stump erythema and pain.  Initial concern for cellulitis or only, however vascular surgery was able to put a probe down to the bone with 200 cc of purulent drainage, more consistent with osteomyelitis. Inflammatory markers are elevated. Vascular  surgery consulted, s/p revision of right BKA stump, washout of Abscess in the posterior portion of the below-knee amputation stump, Deep wound culture to microbiology  Continue broad-spectrum coverage with vancomycin, ceftriaxone, and metronidazole  Tylenol as needed for fever Started gabapentin, patient was taking 6000 mg p.o. twice daily changed oxycodone 5 mg p.o. every 4 hourly as needed WBC count trending down Wound care consulted Follow wound culture   Hypomagnesemia, mag repleted.  Resolved Monitor and replete as needed.  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  EKG today with improved QTc.    Hypertension, continue amlodipine, Lopressor HLD, continue statin  Depression - Continue SSRIs and antipsychotics started on recent [name] admission  Continue Abilify and Effexor  Type 2 diabetes mellitus (HCC) - SSI, moderate - Hold home metformin  Hypothyroid, continued Synthroid Constipation, continue started MiraLAX and Dulcolax nightly.  Discussed the dose if no constipation BPH, continue Flomax Vitamin D insufficiency: started vitamin D 50,000 units p.o. weekly, follow with PCP to repeat vitamin D level after 3 to 6 months.   Anemia secondary to iron deficiency Continue oral iron supplement with vitamin C, repeat iron profile after 3 to 6 months   There is no height or weight on file to calculate BMI.  Interventions:    Pressure Injury 11/06/22 Buttocks Right Stage 2 -  Partial thickness loss of dermis presenting as a shallow open injury with a red, pink wound bed without slough. no odor. no drainage. no slough. 1cm X 0.3cm (Active)  11/06/22 1800  Location: Buttocks  Location Orientation: Right  Staging: Stage 2 -  Partial thickness loss of dermis presenting as a shallow open injury with a red, pink wound bed without slough.  Wound Description (Comments): no odor. no drainage. no slough. 1cm X 0.3cm  Present on Admission:    Dressing Type None 11/15/22 0820     Diet: Heart healthy/carb modified DVT Prophylaxis: Subcutaneous Lovenox   Advance goals of care discussion: Full code  Family Communication: family was NOT present at bedside, at the time of interview.  The pt provided permission to discuss medical plan with the family. Opportunity was given to ask question and all questions were answered satisfactorily.   Disposition:  Pt is from Home, admitted with right BKA infection and depression with SI/HI ideations under BHU, still has infection, on IV antibiotics, s/p right BKA stump washout done on 1/5, wound culture pending, which precludes a safe discharge. Discharge to [facility] vs SNF, when cleared by vascular surgery.  Subjective: No significant events overnight, complaining of pain about 7/10, resumed home gabapentin.  We will continue as needed medication.  Patient denies any other active issues.   Physical Exam: General:  alert oriented to time, place, and person.  Appear in no distress, affect appropriate Eyes: PERRLA ENT: Oral Mucosa Clear, moist  Neck: no JVD,  Cardiovascular: S1 and S2 Present, no Murmur,  Respiratory: good respiratory effort, Bilateral Air entry equal and Decreased, no Crackles, no wheezes Abdomen: Bowel Sound present, Soft and no tenderness,  Skin: no rashes Extremities: no Pedal edema, no calf tenderness, s/p Right BKA stump washout, wound VAC attached. Neurologic: without any new focal findings Gait not checked due to patient safety concerns  Vitals:   11/14/22 1446 11/14/22 1951 11/15/22 0421 11/15/22 0741  BP: 137/65 119/62 (!) 146/69 (!) 141/62  Pulse: 63 66 (!) 56 60  Resp: '18 20 20 19  '$ Temp: 98.8 F (37.1 C) 98.2 F (36.8 C) 97.9 F (36.6 C) 97.8 F (36.6 C)  TempSrc: Oral Oral Oral Oral  SpO2: 97% 97% 99% 96%  Weight:        Intake/Output Summary (Last 24 hours) at 11/15/2022 1502 Last data filed at 11/15/2022 1459 Gross per 24 hour  Intake 690 ml  Output  3150 ml  Net -2460 ml   Filed Weights   11/14/22 0442  Weight: 108.9 kg    Data Reviewed: I have personally reviewed and interpreted daily labs, tele strips, imagings as discussed above. I reviewed all nursing notes, pharmacy notes, vitals, pertinent old records I have discussed plan of care as described above with RN and patient/family.  CBC: Recent Labs  Lab 11/10/22 1916 11/11/22 0616 11/12/22 0610 11/13/22 0857 11/14/22 0552 11/15/22 0454  WBC 12.7* 12.7* 10.8* 9.9 9.5 8.3  NEUTROABS 9.3*  --   --   --   --   --   HGB 9.1* 8.5* 8.5* 8.2* 8.3* 8.0*  HCT 28.5* 26.8* 26.5* 25.2* 26.4* 24.8*  MCV 90.8 91.2 90.1 90.0 90.7 89.5  PLT 303 306 322 288 293 875   Basic Metabolic Panel: Recent Labs  Lab 11/11/22 0616 11/12/22 0610 11/13/22 0857 11/14/22 0552 11/15/22 0454  NA 137 135 133* 133* 135  K 4.0 4.1 4.0 4.2 4.3  CL 103 101 99 100 102  CO2 '25 24 26 25 22  '$ GLUCOSE 93 87 92 109* 157*  BUN 33* 35* 49* 63* 61*  CREATININE 1.03 1.15 1.34* 1.30* 1.26*  CALCIUM 8.9 8.7* 8.3* 8.7* 9.0  MG 1.4* 1.7  1.6* 1.7  --   PHOS 3.5 4.3 5.2* 4.6  --     Studies: No results found.  Scheduled Meds:  amLODipine  10 mg Oral Daily   ARIPiprazole  10 mg Oral Daily   vitamin C  500 mg Oral BID   aspirin EC  81 mg Oral Q breakfast   atorvastatin  80 mg Oral QHS   bisacodyl  10 mg Oral QHS   clotrimazole   Topical BID   enoxaparin (LOVENOX) injection  0.5 mg/kg Subcutaneous Q24H   ferrous gluconate  324 mg Oral Daily   fluticasone furoate-vilanterol  1 puff Inhalation Daily   gabapentin  600 mg Oral BID   insulin aspart  0-15 Units Subcutaneous TID AC & HS   levothyroxine  75 mcg Oral Q0600   magnesium oxide  400 mg Oral Daily   metoprolol tartrate  50 mg Oral BID   multivitamin with minerals  1 tablet Oral Daily   pantoprazole  40 mg Oral Daily   polyethylene glycol  17 g Oral BID   polyvinyl alcohol  1 drop Both Eyes Daily   potassium chloride SA  20 mEq Oral Daily    Ensure Max Protein  11 oz Oral TID   sodium chloride flush  3 mL Intravenous Q12H   tamsulosin  0.8 mg Oral Daily   thiamine  100 mg Oral Daily   torsemide  40 mg Oral Daily   venlafaxine XR  150 mg Oral Q breakfast   Vitamin D (Ergocalciferol)  50,000 Units Oral Q7 days   zinc sulfate  220 mg Oral Daily   Continuous Infusions:  cefTRIAXone (ROCEPHIN)  IV Stopped (11/15/22 0525)   metronidazole Stopped (11/15/22 0744)   vancomycin 750 mg (11/15/22 0835)   PRN Meds: acetaminophen, albuterol, fentaNYL (SUBLIMAZE) injection, hydrALAZINE, HYDROmorphone (DILAUDID) injection, ipratropium-albuterol, loperamide HCl, ondansetron (ZOFRAN) IV, mouth rinse, oxyCODONE  Time spent: 35 minutes  Author: Val Riles. MD [facility] 11/15/2022 3:02 PM  To reach On-call, see care teams to locate the attending and reach out to them via [email or web address]. If 7PM-7AM, please contact night-coverage If you still have difficulty reaching the attending provider, please page the [facility] (Director on Call) for [facility] on amion for assistance.

## 2022-11-15 NOTE — Progress Notes (Signed)
Physical Therapy Treatment Patient Details Name: Shane Chambers MRN: [id number] DOB: [birth year] Today's Date: 11/15/2022   History of Present Illness Pt is a 62 year old male admitted to the medical unit, transfer from gero psych (admitted for SI) found to have complication of his amputation site, followed by vascular. Pt is s/p excisional debridement & drainage of abscess & application of wound vac on RLE residual limb on 11/14/22. PMH significant for CAD s/p CABG, HFrEF with recovered EF, PVD, type 2 diabetes, COPD, hypertension, hyperlipidemia, hypothyroidism, depression    PT Comments    Pt with continue on transfer orders s/p RLE washout; pt seen for PT treatment. Pt presents with flat affect but agreeable to tx & appreciative of PT services. Pt is able to transition sit>supine with mod I, supine>sit with supervision with HOB elevated & bed rails. Pt completes bed>drop arm recliner via lateral scoot with up to mod assist. PT provides assistance with LLE foot placement, blocking L foot to prevent sliding forward, cuing & facilitation for head/hips relationship & scooting across to recliner. Pt fatigued after transfer & requires cuing to follow instructions to increase ease of transfer. Pt would benefit from ongoing acute PT services to address transfers, strengthening, balance, & w/c mobility.    PT reviewed PT goals & updated them as appropriate, as well as PT frequency & d/c plan.    Recommendations for follow up therapy are one component of a multi-disciplinary discharge planning process, led by the attending physician.  Recommendations may be updated based on patient status, additional functional criteria and insurance authorization.  Follow Up Recommendations  Skilled nursing-short term rehab (<3 hours/day) Can patient physically be transported by private vehicle: No   Assistance Recommended at Discharge Frequent or constant Supervision/Assistance  Patient can return home with the  following Two people to help with walking and/or transfers;Direct supervision/assist for medications management;Assist for transportation;Help with stairs or ramp for entrance;A lot of help with bathing/dressing/bathroom   Equipment Recommendations   (TBD in next facility)    Recommendations for Other Services       Precautions / Restrictions Precautions Precautions: Fall Restrictions Weight Bearing Restrictions: Yes RLE Weight Bearing: Non weight bearing     Mobility  Bed Mobility Overal bed mobility: Needs Assistance Bed Mobility: Supine to Sit, Sit to Supine     Supine to sit: Supervision, HOB elevated Sit to supine: Modified independent (Device/Increase time), HOB elevated   General bed mobility comments: use of bed rails PRN    Transfers Overall transfer level: Needs assistance Equipment used: None Transfers: Bed to chair/wheelchair/BSC            Lateral/Scoot Transfers: Min assist, Mod assist General transfer comment: Max cuing for head/hips relationship, sequencing, & PT blocking & assisting with positioning of LLE. Extra time to complete transfer 2/2 fatigue.    Ambulation/Gait                   Stairs             Wheelchair Mobility    Modified Rankin (Stroke Patients Only)       Balance Overall balance assessment: Needs assistance Sitting-balance support: Single extremity supported, Feet supported Sitting balance-Leahy Scale: Fair                                      Cognition Arousal/Alertness: Awake/alert Behavior During Therapy: Flat affect Overall Cognitive Status:  No family/caregiver present to determine baseline cognitive functioning Area of Impairment: Safety/judgement, Awareness                     Memory: Decreased recall of precautions Following Commands: Follows one step commands with increased time Safety/Judgement: Decreased awareness of deficits, Decreased awareness of safety Awareness:  Emergent Problem Solving: Requires verbal cues, Requires tactile cues General Comments: Requires encouragement/education to follow PT cuing during transfer.        Exercises      General Comments        Pertinent Vitals/Pain Pain Assessment Pain Assessment: Faces Faces Pain Scale: Hurts a little bit Pain Location: R residual limb Pain Descriptors / Indicators: Discomfort, Grimacing Pain Intervention(s): Monitored during session, Repositioned    Home Living                          Prior Function            PT Goals (current goals can now be found in the care plan section) Acute Rehab PT Goals Patient Stated Goal: get stronger PT Goal Formulation: With patient Time For Goal Achievement: 11/29/22 Potential to Achieve Goals: Fair Additional Goals Additional Goal #1: Pt will propel w/c x 150 ft with mod I to increase independence with functional mobility. Progress towards PT goals: Progressing toward goals    Frequency    7X/week      PT Plan Frequency needs to be updated;Current plan remains appropriate;Discharge plan needs to be updated    Co-evaluation              AM-PAC PT "6 Clicks" Mobility   Outcome Measure  Help needed turning from your back to your side while in a flat bed without using bedrails?: A Little Help needed moving from lying on your back to sitting on the side of a flat bed without using bedrails?: A Little Help needed moving to and from a bed to a chair (including a wheelchair)?: A Lot Help needed standing up from a chair using your arms (e.g., wheelchair or bedside chair)?: Total Help needed to walk in hospital room?: Total Help needed climbing 3-5 steps with a railing? : Total 6 Click Score: 11    End of Session   Activity Tolerance: Patient tolerated treatment well Patient left: in chair;with chair alarm set;with call bell/phone within reach Nurse Communication: Mobility status PT Visit Diagnosis: Muscle weakness  (generalized) (M62.81);Difficulty in walking, not elsewhere classified (R26.2);Other abnormalities of gait and mobility (R26.89)     Time: 2671-2458 PT Time Calculation (min) (ACUTE ONLY): 14 min  Charges:  $Therapeutic Activity: 8-22 mins                     Lavone Nian, PT, DPT 11/15/22, 3:23 PM   Waunita Schooner 11/15/2022, 3:21 PM

## 2022-11-15 NOTE — Plan of Care (Signed)
  Problem: Coping: Goal: Ability to adjust to condition or change in health will improve Outcome: Progressing   

## 2022-11-16 DIAGNOSIS — M8610 Other acute osteomyelitis, unspecified site: Secondary | ICD-10-CM | POA: Diagnosis not present

## 2022-11-16 LAB — BASIC METABOLIC PANEL
Anion gap: 10 (ref 5–15)
BUN: 63 mg/dL — ABNORMAL HIGH (ref 6–20)
CO2: 25 mmol/L (ref 22–32)
Calcium: 8.8 mg/dL — ABNORMAL LOW (ref 8.9–10.3)
Chloride: 102 mmol/L (ref 98–111)
Creatinine, Ser: 1.18 mg/dL (ref 0.61–1.24)
GFR, Estimated: >60 mL/min (ref >60)
Glucose, Bld: 132 mg/dL — ABNORMAL HIGH (ref 70–99)
Potassium: 4.1 mmol/L (ref 3.5–5.1)
Sodium: 137 mmol/L (ref 135–145)

## 2022-11-16 LAB — CBC
HCT: 24.5 % — ABNORMAL LOW (ref 39.0–52.0)
Hemoglobin: 7.9 g/dL — ABNORMAL LOW (ref 13.0–17.0)
MCH: 29.3 pg (ref 26.0–34.0)
MCHC: 32.2 g/dL (ref 30.0–36.0)
MCV: 90.7 fL (ref 80.0–100.0)
Platelets: 323 10*3/uL (ref 150–400)
RBC: 2.7 MIL/uL — ABNORMAL LOW (ref 4.22–5.81)
RDW: 15.6 % — ABNORMAL HIGH (ref 11.5–15.5)
WBC: 9.6 10*3/uL (ref 4.0–10.5)
nRBC: 0 % (ref 0.0–0.2)

## 2022-11-16 LAB — GLUCOSE, CAPILLARY
Glucose-Capillary: 103 mg/dL — ABNORMAL HIGH (ref 70–99)
Glucose-Capillary: 188 mg/dL — ABNORMAL HIGH (ref 70–99)
Glucose-Capillary: 144 mg/dL — ABNORMAL HIGH (ref 70–99)
Glucose-Capillary: 139 mg/dL — ABNORMAL HIGH (ref 70–99)

## 2022-11-16 LAB — VITAMIN E
Vitamin E (Alpha Tocopherol): 9.7 mg/L (ref 7.0–25.1)
Vitamin E(Gamma Tocopherol): 0.4 mg/L — ABNORMAL LOW (ref 0.5–5.5)

## 2022-11-16 LAB — VANCOMYCIN, PEAK: Vancomycin Pk: 36 ug/mL (ref 30–40)

## 2022-11-16 LAB — VITAMIN A: Vitamin A (Retinoic Acid): 70.2 ug/dL — ABNORMAL HIGH (ref 20.1–62.0)

## 2022-11-16 LAB — VANCOMYCIN, RANDOM: Vancomycin Rm: 21 ug/mL

## 2022-11-16 LAB — VANCOMYCIN, TROUGH: Vancomycin Tr: 30 ug/mL (ref 15–20)

## 2022-11-16 MED ORDER — ENOXAPARIN SODIUM 60 MG/0.6ML IJ SOSY
0.5000 mg/kg | PREFILLED_SYRINGE | INTRAMUSCULAR | Status: DC
  Administered 2022-11-17 – 2022-11-28 (×11): 55 mg via SUBCUTANEOUS
  Filled 2022-11-16 (×11): qty 0.6

## 2022-11-16 MED ORDER — VANCOMYCIN VARIABLE DOSE PER UNSTABLE RENAL FUNCTION (PHARMACIST DOSING): Status: DC

## 2022-11-16 NOTE — Consult Note (Addendum)
Pharmacy Antibiotic Note  Shane Chambers is a 62 y.o. male w/ PMH of BPH, DM, CAD, CABG, CHF, HTN, hypothyroidism, depression, BKA admitted on 11/11/2022 with  right below-knee amputation infection .  Pharmacy has been consulted for vancomycin dosing.  Plan: Continue Vancomycin 750 mg IV every 12 hours Estimated AUC 499.2, Cmin 15.5 Patient had surgery today and missed dose that levels were ordered around, recommend getting levels over weekend. Follow levels for dosing adjustments   1/6:  Pt lost IV access so VP and VT will be retimed to reflect new dosing schedule   1/6:  Vanc 750 mg IV given on 1/6 @ 2221.         Vanc peak on 1/7 @ 0030         Vanc trough on 1/7 @ 1000  1/7:  Vancomycin 750 mg IV given on 1/6 @ 2221         Vanc peak on 1/7 @ 0029 = 36         Vanc trough on 1/7 @ 0950 = 30         Random Vanc on 1/7 @ 2152 = 21   -     Vanc level 24 hrs following 750 mg dose is still slightly elevated. -     Will repeat random Vanc on 1/8 @ 0300  1/8:  Vanc random @ 0238 = 19  Will order Vancomycin 500 mg IV X 1 and recheck vanc level in ~ 24 hrs on 1/9 @ 0300.    Weight: 111.4 kg (245 lb 9.5 oz) Temp (24hrs), Avg:98.1 F (36.7 C), Min:97.7 F (36.5 C), Max:98.6 F (37 C)  Recent Labs  Lab 11/12/22 0610 11/13/22 0857 11/14/22 0552 11/15/22 0454 11/16/22 0029 11/16/22 0447 11/16/22 0950 11/16/22 2152  WBC 10.8* 9.9 9.5 8.3  --  9.6  --   --   CREATININE 1.15 1.34* 1.30* 1.26*  --  1.18  --   --   VANCOTROUGH  --   --   --   --   --   --  30*  --   VANCOPEAK  --   --   --   --  36  --   --   --   VANCORANDOM  --   --   --   --   --   --   --  21     Estimated Creatinine Clearance: 82.9 mL/min (by C-G formula based on SCr of 1.18 mg/dL).    Allergies  Allergen Reactions   Ace Inhibitors Swelling and Cough    (Not on MAR at [name])   Haloperidol And Related     Do NOT give anti-psychotics due to risk of  Torsades and QT  prolongation (per Dr. Teena Irani request)   Other Itching    [name]   Shellfish Allergy    Penicillins Itching and Rash    Has patient had a PCN reaction causing immediate rash, facial/tongue/throat swelling, SOB or lightheadedness with hypotension: Unknown Has patient had a PCN reaction causing severe rash involving mucus membranes or skin necrosis: Unknown Has patient had a PCN reaction that required hospitalization: Unknown Has patient had a PCN reaction occurring within the last 10 years: Unknown If all of the above answers are "NO", then may proceed with Cephalosporin use.     Antimicrobials this admission: 1/1 vancomycin >>  1/1 metronidazole >> 1/1 CTX >>  Microbiology results: N/A  Thank you  for allowing pharmacy to be a part of this patient's care.  Birch Farino D, PharmD 11/16/2022 10:32 PM

## 2022-11-16 NOTE — Progress Notes (Signed)
Physical Therapy Treatment Patient Details Name: Shane Chambers MRN: [id number] DOB: 1961-04-26 Today's Date: 11/16/2022   History of Present Illness Pt is a 62 year old male admitted to the medical unit, transfer from gero psych (admitted for SI) found to have complication of his amputation site, followed by vascular. Pt is s/p excisional debridement & drainage of abscess & application of wound vac on RLE residual limb on 11/14/22. PMH significant for CAD s/p CABG, HFrEF with recovered EF, PVD, type 2 diabetes, COPD, hypertension, hyperlipidemia, hypothyroidism, depression    PT Comments    Pt long sitting in bed upon arrival more on L side of bed.  He is able to reposition himself on R side of bed without difficulty.  He is able to laterally scoot today to drop arm recliner at bedside with supervision and set up.  Increased time given but overall he does quite well today.  Tech in for education on transfers.  Participated in exercises as described below.  Pt is engaged and participates well in session today.  He is pleased with his progress today.   Recommendations for follow up therapy are one component of a multi-disciplinary discharge planning process, led by the attending physician.  Recommendations may be updated based on patient status, additional functional criteria and insurance authorization.  Follow Up Recommendations  Skilled nursing-short term rehab (<3 hours/day)     Assistance Recommended at Discharge Frequent or constant Supervision/Assistance  Patient can return home with the following Direct supervision/assist for medications management;Assist for transportation;Help with stairs or ramp for entrance;A little help with walking and/or transfers;A little help with bathing/dressing/bathroom   Equipment Recommendations       Recommendations for Other Services       Precautions / Restrictions Precautions Precautions: Fall Restrictions Weight Bearing Restrictions: Yes RLE  Weight Bearing: Non weight bearing Other Position/Activity Restrictions: no activity/WB orders in chart, wound along R BKA incision, treated as NWBing today     Mobility  Bed Mobility Overal bed mobility: Modified Independent                  Transfers Overall transfer level: Needs assistance Equipment used: None Transfers: Bed to chair/wheelchair/BSC            Lateral/Scoot Transfers: Supervision, Min guard General transfer comment: does well with stand by assist today and increased time.  overall does very well.    Ambulation/Gait                   Stairs             Wheelchair Mobility    Modified Rankin (Stroke Patients Only)       Balance Overall balance assessment: Needs assistance Sitting-balance support: Single extremity supported, Feet supported Sitting balance-Leahy Scale: Good                                      Cognition Arousal/Alertness: Awake/alert Behavior During Therapy: WFL for tasks assessed/performed Overall Cognitive Status: Within Functional Limits for tasks assessed                                 General Comments: engaged and in good spirits today        Exercises Other Exercises Other Exercises: R LE 4 -way hip in sitting and R knee flex/ext.  manual resistance  and pt taught self resistance modifications.    General Comments        Pertinent Vitals/Pain Pain Assessment Pain Assessment: Faces Faces Pain Scale: Hurts a little bit Pain Location: R residual limb Pain Descriptors / Indicators: Discomfort, Grimacing Pain Intervention(s): Limited activity within patient's tolerance, Monitored during session, Repositioned    Home Living                          Prior Function            PT Goals (current goals can now be found in the care plan section) Progress towards PT goals: Progressing toward goals    Frequency    7X/week      PT Plan Current plan  remains appropriate    Co-evaluation              AM-PAC PT "6 Clicks" Mobility   Outcome Measure  Help needed turning from your back to your side while in a flat bed without using bedrails?: None Help needed moving from lying on your back to sitting on the side of a flat bed without using bedrails?: A Little Help needed moving to and from a bed to a chair (including a wheelchair)?: A Little Help needed standing up from a chair using your arms (e.g., wheelchair or bedside chair)?: Total Help needed to walk in hospital room?: Total Help needed climbing 3-5 steps with a railing? : Total 6 Click Score: 13    End of Session Equipment Utilized During Treatment: Gait belt Activity Tolerance: Patient tolerated treatment well Patient left: in chair;with chair alarm set;with call bell/phone within reach Nurse Communication: Mobility status PT Visit Diagnosis: Muscle weakness (generalized) (M62.81);Difficulty in walking, not elsewhere classified (R26.2);Other abnormalities of gait and mobility (R26.89)     Time: 2694-8546 PT Time Calculation (min) (ACUTE ONLY): 18 min  Charges:  $Therapeutic Exercise: 8-22 mins                   Chesley Noon, PTA 11/16/22, 12:29 PM

## 2022-11-16 NOTE — Progress Notes (Signed)
[facility] Progress Note  Patient: Shane Chambers    XQJ:[id number]  DOA: 11/11/2022     Date of Service: the patient was seen and examined on 11/16/2022  No chief complaint on file.  Brief hospital course:  Shane Chambers is a 62 y.o. male with past medical history of CAD s/p CABG, HFrEF with recovered EF, PVD, type 2 diabetes, COPD, hypertension, hyperlipidemia, hypothyroidism, depression who is current he admitted for suicidal ideation and found to have complication of his amputation site.  Mr. Statzer states that for the last 3 days, he has been experiencing increasing pain of his right BKA stump in addition to increasing erythema.  His nurse at bedside states that he has been picking at it. Patient has not noticed any purulent drainage at this time but there are areas where he is worried that the incision is opening. Per chart review, patient was admitted on 11/23 for cellulitis of the right lower extremity extending from the foot up towards the knee.  CT imaging at that time demonstrated holdable complex fluid collections and possible medial malleolus osteomyelitis.  MRI was subsequently obtained that demonstrated right ankle septic arthritis.  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  He followed up with orthopedic surgery on 12/14.  At that time, he was instructed to follow-up in 1 week for staple removal.   Assessment and Plan:  Right BKA stump abscess, s/p revision and washout done on 11/14/2022 H/o Acute osteomyelitis, s/p right BKA Initially patient was seen in consultation at [facility], however required transfer to the medical unit due to inability to administer IV antibiotics.  Patient presented with 3 days of gradually worsening right stump erythema and pain.  Initial concern for cellulitis or only, however vascular surgery was able to put a probe down to the bone with 200 cc of purulent drainage, more consistent with osteomyelitis. Inflammatory markers are elevated. Vascular  surgery consulted, s/p revision of right BKA stump, washout of Abscess in the posterior portion of the below-knee amputation stump, Deep wound culture to microbiology  Continue broad-spectrum coverage with vancomycin, ceftriaxone, and metronidazole  Tylenol as needed for fever Started gabapentin, patient was taking 6000 mg p.o. twice daily changed oxycodone 5 mg p.o. every 4 hourly as needed WBC count trending down Wound care consulted Follow wound culture   Hypomagnesemia, mag repleted.  Resolved Monitor and replete as needed.  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  EKG today with improved QTc.    Hypertension, continue amlodipine, Lopressor HLD, continue statin  Depression - Continue SSRIs and antipsychotics started on recent [facility] admission  Continue Abilify and Effexor  Type 2 diabetes mellitus (HCC) - SSI, moderate - Hold home metformin  Hypothyroid, continued Synthroid Constipation, continue started MiraLAX and Dulcolax nightly.  Discussed the dose if no constipation BPH, continue Flomax Vitamin D insufficiency: started vitamin D 50,000 units p.o. weekly, follow with PCP to repeat vitamin D level after 3 to 6 months.   Anemia secondary to iron deficiency Continue oral iron supplement with vitamin C, repeat iron profile after 3 to 6 months   There is no height or weight on file to calculate BMI.  Interventions:    Pressure Injury 11/06/22 Buttocks Right Stage 2 -  Partial thickness loss of dermis presenting as a shallow open injury with a red, pink wound bed without slough. no odor. no drainage. no slough. 1cm X 0.3cm (Active)  11/06/22 1800  Location: Buttocks  Location Orientation: Right  Staging: Stage 2 -  Partial thickness loss of dermis presenting as a shallow open injury with a red, pink wound bed without slough.  Wound Description (Comments): no odor. no drainage. no slough. 1cm X 0.3cm  Present on Admission:    Dressing Type None 11/16/22 5885     Diet: Heart healthy/carb modified DVT Prophylaxis: Subcutaneous Lovenox   Advance goals of care discussion: Full code  Family Communication: family was NOT present at bedside, at the time of interview.  The pt provided permission to discuss medical plan with the family. Opportunity was given to ask question and all questions were answered satisfactorily.   Disposition:  Pt is from Home, admitted with right BKA infection and depression with SI/HI ideations under BHU, still has infection, on IV antibiotics, s/p right BKA stump washout done on 1/5, wound culture growing rare staph aureus, sensitivity pending, which precludes a safe discharge. [facility]charge to [facility] vs SNF, when cleared by vascular surgery.  Subjective: No significant events overnight, complaining of pain about 4/10 well-controlled today.  Patient was resting comfortably, denied any active issues.     Physical Exam: General:  alert oriented to time, place, and person.  Appear in no distress, affect appropriate Eyes: PERRLA ENT: Oral Mucosa Clear, moist  Neck: no JVD,  Cardiovascular: S1 and S2 Present, no Murmur,  Respiratory: good respiratory effort, Bilateral Air entry equal and Decreased, no Crackles, no wheezes Abdomen: Bowel Sound present, Soft and no tenderness,  Skin: no rashes Extremities: no Pedal edema, no calf tenderness, s/p Right BKA stump washout, wound VAC attached. Neurologic: without any new focal findings Gait not checked due to patient safety concerns  Vitals:   11/15/22 1834 11/16/22 0345 11/16/22 0346 11/16/22 0749  BP: 136/70 130/66  (!) 146/67  Pulse: (!) 58 (!) 58  (!) 54  Resp: '16 18  18  '$ Temp: 98 F (36.7 C) 97.8 F (36.6 C)  98.1 F (36.7 C)  TempSrc: Oral Oral  Oral  SpO2: 97% (!) 89%  98%  Weight:   111.4 kg     Intake/Output Summary (Last 24 hours) at 11/16/2022 1530 Last data filed at 11/16/2022 1327 Gross per 24 hour  Intake 569.05 ml  Output  5200 ml  Net -4630.95 ml   Filed Weights   11/14/22 0442 11/16/22 0346  Weight: 108.9 kg 111.4 kg    Data Reviewed: I have personally reviewed and interpreted daily labs, tele strips, imagings as discussed above. I reviewed all nursing notes, pharmacy notes, vitals, pertinent old records I have discussed plan of care as described above with RN and patient/family.  CBC: Recent Labs  Lab 11/10/22 1916 11/11/22 0616 11/12/22 0610 11/13/22 0857 11/14/22 0552 11/15/22 0454 11/16/22 0447  WBC 12.7*   < > 10.8* 9.9 9.5 8.3 9.6  NEUTROABS 9.3*  --   --   --   --   --   --   HGB 9.1*   < > 8.5* 8.2* 8.3* 8.0* 7.9*  HCT 28.5*   < > 26.5* 25.2* 26.4* 24.8* 24.5*  MCV 90.8   < > 90.1 90.0 90.7 89.5 90.7  PLT 303   < > 322 288 293 333 323   < > = values in this interval not displayed.   Basic Metabolic Panel: Recent Labs  Lab 11/11/22 0616 11/12/22 0610 11/13/22 0857 11/14/22 0552 11/15/22 0454 11/16/22 0447  NA 137 135 133* 133* 135 137  K 4.0 4.1 4.0 4.2 4.3 4.1  CL 103 101 99 100 102 102  CO2 '25 24 26 25 22 25  '$ GLUCOSE 93 87 92 109* 157* 132*  BUN 33* 35* 49* 63* 61* 63*  CREATININE 1.03 1.15 1.34* 1.30* 1.26* 1.18  CALCIUM 8.9 8.7* 8.3* 8.7* 9.0 8.8*  MG 1.4* 1.7 1.6* 1.7  --   --   PHOS 3.5 4.3 5.2* 4.6  --   --     Studies: No results found.  Scheduled Meds:  amLODipine  10 mg Oral Daily   ARIPiprazole  10 mg Oral Daily   vitamin C  500 mg Oral BID   aspirin EC  81 mg Oral Q breakfast   atorvastatin  80 mg Oral QHS   bisacodyl  10 mg Oral QHS   clotrimazole   Topical BID   [START ON 11/17/2022] enoxaparin (LOVENOX) injection  0.5 mg/kg Subcutaneous Q24H   ferrous gluconate  324 mg Oral Daily   fluticasone furoate-vilanterol  1 puff Inhalation Daily   gabapentin  600 mg Oral BID   insulin aspart  0-15 Units Subcutaneous TID AC & HS   levothyroxine  75 mcg Oral Q0600   magnesium oxide  400 mg Oral Daily   metoprolol tartrate  50 mg Oral BID   mineral oil  1  enema Rectal Once   multivitamin with minerals  1 tablet Oral Daily   pantoprazole  40 mg Oral Daily   polyethylene glycol  17 g Oral BID   polyvinyl alcohol  1 drop Both Eyes Daily   potassium chloride SA  20 mEq Oral Daily   Ensure Max Protein  11 oz Oral TID   sodium chloride flush  3 mL Intravenous Q12H   tamsulosin  0.8 mg Oral Daily   thiamine  100 mg Oral Daily   torsemide  40 mg Oral Daily   vancomycin variable dose per unstable renal function (pharmacist dosing)   Intravenous See admin instructions   venlafaxine XR  150 mg Oral Q breakfast   Vitamin D (Ergocalciferol)  50,000 Units Oral Q7 days   zinc sulfate  220 mg Oral Daily   Continuous Infusions:  cefTRIAXone (ROCEPHIN)  IV Stopped (11/16/22 0549)   metronidazole Stopped (11/16/22 0517)   PRN Meds: acetaminophen, albuterol, fentaNYL (SUBLIMAZE) injection, hydrALAZINE, HYDROmorphone (DILAUDID) injection, ipratropium-albuterol, loperamide HCl, ondansetron (ZOFRAN) IV, mouth rinse, oxyCODONE  Time spent: 35 minutes  Author: Val Riles. MD [facility] 11/16/2022 3:30 PM  To reach On-call, see care teams to locate the attending and reach out to them via [email or web address]. If 7PM-7AM, please contact night-coverage If you still have difficulty reaching the attending provider, please page the [facility] (Director on Call) for [facility] on amion for assistance.

## 2022-11-16 NOTE — Plan of Care (Signed)
  Problem: Coping: Goal: Ability to adjust to condition or change in health will improve Outcome: Progressing   

## 2022-11-17 ENCOUNTER — Encounter: Payer: Self-pay | Admitting: Vascular Surgery

## 2022-11-17 ENCOUNTER — Other Ambulatory Visit: Payer: Self-pay

## 2022-11-17 DIAGNOSIS — A4902 Methicillin resistant Staphylococcus aureus infection, unspecified site: Secondary | ICD-10-CM

## 2022-11-17 DIAGNOSIS — T8740 Infection of amputation stump, unspecified extremity: Secondary | ICD-10-CM

## 2022-11-17 DIAGNOSIS — M8610 Other acute osteomyelitis, unspecified site: Secondary | ICD-10-CM | POA: Diagnosis not present

## 2022-11-17 LAB — CREATININE, SERUM
Creatinine, Ser: 1.25 mg/dL — ABNORMAL HIGH (ref 0.61–1.24)
GFR, Estimated: >60 mL/min (ref >60)

## 2022-11-17 LAB — GLUCOSE, CAPILLARY
Glucose-Capillary: 112 mg/dL — ABNORMAL HIGH (ref 70–99)
Glucose-Capillary: 112 mg/dL — ABNORMAL HIGH (ref 70–99)
Glucose-Capillary: 147 mg/dL — ABNORMAL HIGH (ref 70–99)
Glucose-Capillary: 147 mg/dL — ABNORMAL HIGH (ref 70–99)
Glucose-Capillary: 148 mg/dL — ABNORMAL HIGH (ref 70–99)

## 2022-11-17 LAB — COPPER, SERUM: Copper: 111 ug/dL (ref 69–132)

## 2022-11-17 LAB — VANCOMYCIN, RANDOM: Vancomycin Rm: 19 ug/mL

## 2022-11-17 LAB — ZINC: Zinc: 52 ug/dL (ref 44–115)

## 2022-11-17 MED ORDER — BISACODYL 10 MG RE SUPP
10.0000 mg | Freq: Once | RECTAL | Status: AC
  Administered 2022-11-17: 10 mg via RECTAL
  Filled 2022-11-17: qty 1

## 2022-11-17 MED ORDER — VANCOMYCIN HCL 1750 MG/350ML IV SOLN
1750.0000 mg | INTRAVENOUS | Status: DC
  Administered 2022-11-18: 1750 mg via INTRAVENOUS
  Filled 2022-11-17 (×2): qty 350

## 2022-11-17 MED ORDER — VANCOMYCIN HCL 500 MG/100ML IV SOLN
500.0000 mg | Freq: Once | INTRAVENOUS | Status: AC
  Administered 2022-11-17: 500 mg via INTRAVENOUS
  Filled 2022-11-17: qty 100

## 2022-11-17 NOTE — Progress Notes (Signed)
PT Cancellation Note  Patient Details Name: Shane Chambers MRN: [id number] DOB: 1961/05/31   Cancelled Treatment:    Reason Eval/Treat Not Completed: Other (comment)  Offered and encouraged session.  Pt stated surgeon was recently in and he may go back to OR today but was unsure at this time.  Offered OOB to chair but he declined at this time. Is sitting partially EOB but upright.  Will continue to monitor.  Encouraged OOB with nursing later if he does not go for procedure.   Chesley Noon 11/17/2022, 11:46 AM

## 2022-11-17 NOTE — Progress Notes (Signed)
Pharmacy Antibiotic Note  Shane Chambers is a 62 y.o. male w/ PMH of BPH, DM, CAD, CABG, CHF, HTN, hypothyroidism, depression, BKA admitted on 11/11/2022 with  right below-knee amputation infection .  Pharmacy has been consulted for vancomycin dosing.  Levels were drawn at steady-state on 750 mg IV vancomycin every 12 hours resulting in the following pharmacokinetic parameters:   Ke: 0.0195 h-1 T1/2: 35.5 h  A subsequent 500 mg IV vancomycin dose was administered following random vancomycin levels resulting in a calculated peak of 26 mcg/mL. Renal function is fairly stable  Plan: given above kinetic parameters we can order the following regimen; vancomycin 1750 mg IV every 48 hours starting 11/18/22 at 1800 (calculated trough 13 mcg/mL) Expected AUC: 463.9 Css (calculated) 29.2/11.9 mcg/mL Repeat SCr and random vancomycin level in am Follow levels for dosing adjustments   Weight: 111.4 kg (245 lb 9.5 oz) Temp (24hrs), Avg:98 F (36.7 C), Min:97.7 F (36.5 C), Max:98.6 F (37 C)  Recent Labs  Lab 11/12/22 0610 11/13/22 0857 11/14/22 0552 11/15/22 0454 11/16/22 0029 11/16/22 0447 11/16/22 0950 11/16/22 2152 11/17/22 0238  WBC 10.8* 9.9 9.5 8.3  --  9.6  --   --   --   CREATININE 1.15 1.34* 1.30* 1.26*  --  1.18  --   --  1.25*  VANCOTROUGH  --   --   --   --   --   --  30*  --   --   VANCOPEAK  --   --   --   --  36  --   --   --   --   VANCORANDOM  --   --   --   --   --   --   --  21 19     Estimated Creatinine Clearance: 78.3 mL/min (A) (by C-G formula based on SCr of 1.25 mg/dL (H)).    Allergies  Allergen Reactions   Ace Inhibitors Swelling and Cough    (Not on MAR at [facility])   Haloperidol And Related     Do NOT give anti-psychotics due to risk of  Torsades and QT prolongation (per Dr. Graciela Husbands request)   Other Itching    [name]   Shellfish Allergy    Penicillins Itching and Rash    Has patient had a PCN reaction  causing immediate rash, facial/tongue/throat swelling, SOB or lightheadedness with hypotension: Unknown Has patient had a PCN reaction causing severe rash involving mucus membranes or skin necrosis: Unknown Has patient had a PCN reaction that required hospitalization: Unknown Has patient had a PCN reaction occurring within the last 10 years: Unknown If all of the above answers are "NO", then may proceed with Cephalosporin use.     Antimicrobials this admission: 1/1 vancomycin >>  1/1 metronidazole >> 1/1 ceftriaxone >>  Microbiology results: 1/5 WCx MRSA (pending)  Thank you for allowing pharmacy to be a part of this patient's care.  Dallie Piles, PharmD, BCPS 11/17/2022 7:06 AM

## 2022-11-17 NOTE — TOC Progression Note (Addendum)
Transition of Care [facility]) - Progression Note    Patient Details  Name: Shane Chambers MRN: [id number] Date of Birth: August 12, 1961  Transition of Care [facility]) CM/SW [place], LCSW Phone Number: 11/17/2022, 9:59 AM  Clinical Narrative: Went by room to update patient but he was sleeping and did not wake up to [place] calling his name.    12:24 pm: Met with patient and provided update. He stated he is not returning to [place] and is not sure where he will go at discharge if no other facilities offer a bed. He confirmed he cannot return home with his mother and has no friends that he can stay with. Encouraged him to start thinking about what his plan will be.  Expected Discharge Plan and Services                                               Social Determinants of Health (SDOH) Interventions SDOH Screenings   Food Insecurity: No Food Insecurity (11/13/2022)  Housing: Low Risk  (11/13/2022)  Transportation Needs: No Transportation Needs (11/13/2022)  Utilities: Not At Risk (11/13/2022)  Alcohol Screen: Low Risk  (11/05/2022)  Tobacco Use: Medium Risk (11/17/2022)    Readmission Risk Interventions    11/11/2022    2:05 PM 10/03/2022   10:08 AM 09/26/2022    1:16 PM  Readmission Risk Prevention Plan  Transportation Screening Complete Complete Complete  PCP or Specialist Appt within 3-5 Days   Complete  HRI or [place]   Complete  Social Work Consult for [place] Planning/Counseling   Complete  Palliative Care Screening   Not Applicable  Medication Review [employer])   Complete  HRI or Home Care Consult Complete Complete   SW Recovery Care/Counseling Consult Complete Complete   Palliative Care Screening Not Applicable Not Applicable   Skilled Nursing Facility Complete Complete

## 2022-11-17 NOTE — Progress Notes (Signed)
[facility] Progress Note  Patient: Shane Chambers    JAS:[id number]  DOA: 11/11/2022     Date of Service: the patient was seen and examined on 11/17/2022  No chief complaint on file.  Brief hospital course:  LOVEL SUAZO is a 62 y.o. male with past medical history of CAD s/p CABG, HFrEF with recovered EF, PVD, type 2 diabetes, COPD, hypertension, hyperlipidemia, hypothyroidism, depression who is current he admitted for suicidal ideation and found to have complication of his amputation site.  Mr. Ricketts states that for the last 3 days, he has been experiencing increasing pain of his right BKA stump in addition to increasing erythema.  His nurse at bedside states that he has been picking at it. Patient has not noticed any purulent drainage at this time but there are areas where he is worried that the incision is opening. Per chart review, patient was admitted on 11/23 for cellulitis of the right lower extremity extending from the foot up towards the knee.  CT imaging at that time demonstrated holdable complex fluid collections and possible medial malleolus osteomyelitis.  MRI was subsequently obtained that demonstrated right ankle septic arthritis.  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  He followed up with orthopedic surgery on 12/14.  At that time, he was instructed to follow-up in 1 week for staple removal.   Assessment and Plan:  Right BKA stump abscess, s/p revision and washout done on 11/14/2022 H/o Acute osteomyelitis, s/p right BKA Initially patient was seen in consultation at [facility], however required transfer to the medical unit due to inability to administer IV antibiotics.  Patient presented with 3 days of gradually worsening right stump erythema and pain.  Initial concern for cellulitis or only, however vascular surgery was able to put a probe down to the bone with 200 cc of purulent drainage, more consistent with osteomyelitis. Inflammatory markers are elevated. Vascular  surgery consulted, s/p revision of right BKA stump, washout of Abscess in the posterior portion of the below-knee amputation stump, Deep wound culture to microbiology  Abx vancomycin, ceftriaxone, and metronidazole  1/8 wound culture growing MRSA, discontinued ceftriaxone.  Wound culture for 5 days Tylenol as needed for fever Started gabapentin, patient was taking 6000 mg p.o. twice daily changed oxycodone 5 mg p.o. every 4 hourly as needed WBC count trending down Wound care consulted 1/8 ID consulted for recommendation of antibiotics and duration of treatment    Hypomagnesemia, mag repleted.  Resolved Monitor and replete as needed.  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  EKG today with improved QTc.    Hypertension, continue amlodipine, Lopressor HLD, continue statin  Depression - Continue SSRIs and antipsychotics started on recent [facility] admission  Continue Abilify and Effexor  Type 2 diabetes mellitus (HCC) - SSI, moderate - Hold home metformin  Hypothyroid, continued Synthroid Constipation, continue started MiraLAX and Dulcolax nightly.  Discussed the dose if no constipation BPH, continue Flomax Vitamin D insufficiency: started vitamin D 50,000 units p.o. weekly, follow with PCP to repeat vitamin D level after 3 to 6 months.   Anemia secondary to iron deficiency Continue oral iron supplement with vitamin C, repeat iron profile after 3 to 6 months   There is no height or weight on file to calculate BMI.  Interventions:    Pressure Injury 11/06/22 Buttocks Right Stage 2 -  Partial thickness loss of dermis presenting as a shallow open injury with a red, pink wound bed  without slough. no odor. no drainage. no slough. 1cm X 0.3cm (Active)  11/06/22 1800  Location: Buttocks  Location Orientation: Right  Staging: Stage 2 -  Partial thickness loss of dermis presenting as a shallow open injury with a red, pink wound bed without slough.   Wound Description (Comments): no odor. no drainage. no slough. 1cm X 0.3cm  Present on Admission:   Dressing Type None 11/17/22 0805     Diet: Heart healthy/carb modified DVT Prophylaxis: Subcutaneous Lovenox   Advance goals of care discussion: Full code  Family Communication: family was NOT present at bedside, at the time of interview.  The pt provided permission to discuss medical plan with the family. Opportunity was given to ask question and all questions were answered satisfactorily.   Disposition:  Pt is from Home, admitted with right BKA infection and depression with SI/HI ideations under BHU, still has infection, on IV antibiotics, s/p right BKA stump washout done on 1/5, wound culture growing MRSA, ID consulted for further recommendation, which precludes a safe discharge. Discharge to SNF, when cleared by vascular surgery and ID  Subjective: No significant events overnight, complaining of pain about 5/10 well-controlled today.  Patient was resting comfortably, denied any active issues.     Physical Exam: General:  alert oriented to time, place, and person.  Appear in no distress, affect appropriate Eyes: PERRLA ENT: Oral Mucosa Clear, moist  Neck: no JVD,  Cardiovascular: S1 and S2 Present, no Murmur,  Respiratory: good respiratory effort, Bilateral Air entry equal and Decreased, no Crackles, no wheezes Abdomen: Bowel Sound present, Soft and no tenderness,  Skin: no rashes Extremities: no Pedal edema, no calf tenderness, s/p Right BKA stump washout, wound VAC attached. Neurologic: without any new focal findings Gait not checked due to patient safety concerns  Vitals:   11/16/22 1647 11/16/22 2003 11/17/22 0358 11/17/22 0824  BP: (!) 113/58 (!) 118/50 131/73 123/67  Pulse: 66 65 (!) 56 (!) 58  Resp: '20 18  16  '$ Temp: 97.7 F (36.5 C) 98.6 F (37 C) 97.7 F (36.5 C) 97.7 F (36.5 C)  TempSrc: Oral Oral  Oral  SpO2: 98% 97% 98% 98%  Weight:        Intake/Output  Summary (Last 24 hours) at 11/17/2022 1514 Last data filed at 11/17/2022 1500 Gross per 24 hour  Intake 470 ml  Output 2450 ml  Net -1980 ml   Filed Weights   11/14/22 0442 11/16/22 0346  Weight: 108.9 kg 111.4 kg    Data Reviewed: I have personally reviewed and interpreted daily labs, tele strips, imagings as discussed above. I reviewed all nursing notes, pharmacy notes, vitals, pertinent old records I have discussed plan of care as described above with RN and patient/family.  CBC: Recent Labs  Lab 11/10/22 1916 11/11/22 0616 11/12/22 0610 11/13/22 0857 11/14/22 0552 11/15/22 0454 11/16/22 0447  WBC 12.7*   < > 10.8* 9.9 9.5 8.3 9.6  NEUTROABS 9.3*  --   --   --   --   --   --   HGB 9.1*   < > 8.5* 8.2* 8.3* 8.0* 7.9*  HCT 28.5*   < > 26.5* 25.2* 26.4* 24.8* 24.5*  MCV 90.8   < > 90.1 90.0 90.7 89.5 90.7  PLT 303   < > 322 288 293 333 323   < > = values in this interval not displayed.   Basic Metabolic Panel: Recent Labs  Lab 11/11/22 [phone] 11/12/22 0610 11/13/22 0857 11/14/22 1700  11/15/22 0454 11/16/22 0447 11/17/22 0238  NA 137 135 133* 133* 135 137  --   K 4.0 4.1 4.0 4.2 4.3 4.1  --   CL 103 101 99 100 102 102  --   CO2 '25 24 26 25 22 25  '$ --   GLUCOSE 93 87 92 109* 157* 132*  --   BUN 33* 35* 49* 63* 61* 63*  --   CREATININE 1.03 1.15 1.34* 1.30* 1.26* 1.18 1.25*  CALCIUM 8.9 8.7* 8.3* 8.7* 9.0 8.8*  --   MG 1.4* 1.7 1.6* 1.7  --   --   --   PHOS 3.5 4.3 5.2* 4.6  --   --   --     Studies: No results found.  Scheduled Meds:  amLODipine  10 mg Oral Daily   ARIPiprazole  10 mg Oral Daily   vitamin C  500 mg Oral BID   aspirin EC  81 mg Oral Q breakfast   atorvastatin  80 mg Oral QHS   bisacodyl  10 mg Oral QHS   clotrimazole   Topical BID   enoxaparin (LOVENOX) injection  0.5 mg/kg Subcutaneous Q24H   ferrous gluconate  324 mg Oral Daily   fluticasone furoate-vilanterol  1 puff Inhalation Daily   gabapentin  600 mg Oral BID   insulin aspart  0-15  Units Subcutaneous TID AC & HS   levothyroxine  75 mcg Oral Q0600   magnesium oxide  400 mg Oral Daily   metoprolol tartrate  50 mg Oral BID   mineral oil  1 enema Rectal Once   multivitamin with minerals  1 tablet Oral Daily   pantoprazole  40 mg Oral Daily   polyethylene glycol  17 g Oral BID   polyvinyl alcohol  1 drop Both Eyes Daily   potassium chloride SA  20 mEq Oral Daily   Ensure Max Protein  11 oz Oral TID   sodium chloride flush  3 mL Intravenous Q12H   tamsulosin  0.8 mg Oral Daily   thiamine  100 mg Oral Daily   torsemide  40 mg Oral Daily   venlafaxine XR  150 mg Oral Q breakfast   Vitamin D (Ergocalciferol)  50,000 Units Oral Q7 days   zinc sulfate  220 mg Oral Daily   Continuous Infusions:  metronidazole 500 mg (11/17/22 0625)   [START ON 11/18/2022] vancomycin     PRN Meds: acetaminophen, albuterol, fentaNYL (SUBLIMAZE) injection, hydrALAZINE, HYDROmorphone (DILAUDID) injection, ipratropium-albuterol, loperamide HCl, ondansetron (ZOFRAN) IV, mouth rinse, oxyCODONE  Time spent: 35 minutes  Author: Val Riles. MD [facility] 11/17/2022 3:14 PM  To reach On-call, see care teams to locate the attending and reach out to them via [email or web address]. If 7PM-7AM, please contact night-coverage If you still have difficulty reaching the attending provider, please page the [facility] (Director on Call) for [facility] on amion for assistance.

## 2022-11-17 NOTE — Progress Notes (Signed)
Occupational Therapy Treatment Patient Details Name: Shane Chambers MRN: [id number] DOB: 1960-11-28 Today's Date: 11/17/2022   History of present illness Pt is a 62 year old male admitted to the medical unit, transfer from gero psych (admitted for SI) found to have complication of his amputation site, followed by vascular. Pt is s/p excisional debridement & drainage of abscess & application of wound vac on RLE residual limb on 11/14/22. PMH significant for CAD s/p CABG, HFrEF with recovered EF, PVD, type 2 diabetes, COPD, hypertension, hyperlipidemia, hypothyroidism, depression   OT comments  Mr. Satz demonstrated good progress today. He was able to sit EOB for 20+ minutes, reaching beyond BOS w/o any LOB. Could perform bed mobility w/ SUPV and able to scoot-transfer from sitting EOB to recliner with increased time and effort but no physical assistance required. Pt reports pain in residual limb is reduced today (~5/10 w/ movement), pt also reports his mood is better today and he is not having any SI at present, has begun to feel more hopeful. Engaged in discussion re: how pt can tend to his emotional and spiritual health, w/ pt able to verbalize several positive ideas. Pt reports he does not think he needs inpatient psych services presently. Continue to recommend DC to SNF.    Recommendations for follow up therapy are one component of a multi-disciplinary discharge planning process, led by the attending physician.  Recommendations may be updated based on patient status, additional functional criteria and insurance authorization.    Follow Up Recommendations  Skilled nursing-short term rehab (<3 hours/day)     Assistance Recommended at Discharge Frequent or constant Supervision/Assistance  Patient can return home with the following  Assistance with cooking/housework;Direct supervision/assist for medications management;Assist for transportation;Help with stairs or ramp for entrance;Direct  supervision/assist for financial management;A lot of help with walking and/or transfers;A lot of help with bathing/dressing/bathroom   Equipment Recommendations       Recommendations for Other Services      Precautions / Restrictions Precautions Precautions: Fall Restrictions Weight Bearing Restrictions: Yes [name] Weight Bearing: Weight bearing as tolerated LUE Weight Bearing: Weight bearing as tolerated Other Position/Activity Restrictions: no activity/WB orders in chart, wound along R BKA incision, treated as NWBing today       Mobility Bed Mobility Overal bed mobility: Needs Assistance Bed Mobility: Supine to Sit, Sit to Supine Rolling: Supervision   Supine to sit: Supervision, HOB elevated Sit to supine: Supervision, HOB elevated        Transfers Overall transfer level: Needs assistance Equipment used: None Transfers: Bed to chair/wheelchair/BSC            Lateral/Scoot Transfers: Min guard General transfer comment: able to scoot-transfer from bed to recliner w/o physical assist from therapist     Balance Overall balance assessment: Needs assistance   Sitting balance-Leahy Scale: Good Sitting balance - Comments: no LOB while seated EOB     Standing balance-Leahy Scale: Zero                             ADL either performed or assessed with clinical judgement   ADL Overall ADL's : Needs assistance/impaired Eating/Feeding: Supervision/ safety;Sitting Eating/Feeding Details (indicate cue type and reason): small amount of spillage on table and floor                     Toilet Transfer: Supervision/safety Toilet Transfer Details (indicate cue type and reason): using urinal sitting EOB  Extremity/Trunk Assessment Upper Extremity Assessment Upper Extremity Assessment: Generalized weakness [name] Deficits / Details: Thenar and general UE atrophy visually apparent. R UE weaker than L.  Does not show great grip or tricep b/l  (R grossly 3+/5, L -4/5), lacks ability to fully open/extend fingers. [name] Coordination: decreased fine motor;decreased gross motor   Lower Extremity Assessment RLE Deficits / Details: s/p R BKA, noted redness/ openings along incision        Vision       Perception     Praxis      Cognition Arousal/Alertness: Awake/alert Behavior During Therapy: WFL for tasks assessed/performed Overall Cognitive Status: Within Functional Limits for tasks assessed                                 General Comments: engaged and in good spirits today        Exercises Other Exercises Other Exercises: Bed mobility, transfers, toileting, self-feeding. Discussion re: DC recs, self care, depression mgmt    Shoulder Instructions       General Comments      Pertinent Vitals/ Pain       Pain Assessment Pain Score: 5  Pain Location: R residual limb Pain Descriptors / Indicators: Aching, Grimacing Pain Intervention(s): Limited activity within patient's tolerance, Repositioned  Home Living                                          Prior Functioning/Environment              Frequency  Min 2X/week        Progress Toward Goals  OT Goals(current goals can now be found in the care plan section)  Progress towards OT goals: Progressing toward goals  Acute Rehab OT Goals OT Goal Formulation: With patient Time For Goal Achievement: 11/26/22 Potential to Achieve Goals: [employer] Discharge plan remains appropriate;Frequency remains appropriate    Co-evaluation                 AM-PAC OT "6 Clicks" Daily Activity     Outcome Measure   Help from another person eating meals?: None Help from another person taking care of personal grooming?: A Little Help from another person toileting, which includes using toliet, bedpan, or urinal?: A Lot Help from another person bathing (including washing, rinsing, drying)?: A Lot Help from another person to put  on and taking off regular upper body clothing?: A Little Help from another person to put on and taking off regular lower body clothing?: A Lot 6 Click Score: 16    End of Session    OT Visit Diagnosis: Unsteadiness on feet (R26.81);Muscle weakness (generalized) (M62.81)   Activity Tolerance Patient tolerated treatment well   Patient Left in chair;with call bell/phone within reach;with chair alarm set   Nurse Communication          Time: 2620-3559 OT Time Calculation (min): 39 min  Charges: OT General Charges $OT Visit: 1 Visit OT Treatments $Self Care/Home Management : 38-52 mins Josiah Lobo, PhD, MS, OTR/L 11/17/22, 3:09 PM

## 2022-11-17 NOTE — H&P (View-Only) (Signed)
Progress Note    11/17/2022 5:32 PM 3 Days Post-Op  Subjective:  Shane Chambers is a 62 y.o. male with past medical history of CAD s/p CABG, HFrEF with recovered EF, PVD, type 2 diabetes, COPD, hypertension, hyperlipidemia, hypothyroidism, depression who is current he admitted for suicidal ideation and found to have complication of his amputation site.   On 11/14/2022 patient was taken to the operating room for right below the knee amputation revision with incision and drainage of a stump infection.  Infection was washed out in the operating room.  Patient's incisional wound was left open for wound VAC placement.  Wound VAC is operating well patient is recovering as expected.  Patient remains on IV antibiotics.  No complications to note and vitals all remained stable.  Plan is to take the patient back to the operating room tomorrow for wound VAC change and second wound washout.   Vitals:   11/17/22 0824 11/17/22 1540  BP: 123/67 (!) 113/55  Pulse: (!) 58 (!) 58  Resp: 16 18  Temp: 97.7 F (36.5 C) 98 F (36.7 C)  SpO2: 98% 99%   Physical Exam: Cardiac:  RRR Lungs:  Clear but diminished in the bases  Incisions:  Clean dry and intact with wound vac in place in unclosed incision.  Extremities:  Right BKA with wound vac post wash out.  Abdomen: Positive bowel sounds soft nontender nondistended. Neurologic: And oriented x 3 person place and time.  CBC    Component Value Date/Time   WBC 9.6 11/16/2022 0447   RBC 2.70 (L) 11/16/2022 0447   HGB 7.9 (L) 11/16/2022 0447   HCT 24.5 (L) 11/16/2022 0447   PLT 323 11/16/2022 0447   MCV 90.7 11/16/2022 0447   MCH 29.3 11/16/2022 0447   MCHC 32.2 11/16/2022 0447   RDW 15.6 (H) 11/16/2022 0447   LYMPHSABS 1.5 11/10/2022 1916   MONOABS 1.0 11/10/2022 1916   EOSABS 0.7 (H) 11/10/2022 1916   BASOSABS 0.1 11/10/2022 1916    BMET    Component Value Date/Time   NA 137 11/16/2022 0447   K 4.1 11/16/2022 0447   CL 102 11/16/2022 0447    CO2 25 11/16/2022 0447   GLUCOSE 132 (H) 11/16/2022 0447   BUN 63 (H) 11/16/2022 0447   CREATININE 1.25 (H) 11/17/2022 0238   CALCIUM 8.8 (L) 11/16/2022 0447   GFRNONAA >60 11/17/2022 0238   GFRAA >60 08/07/2020 0255    INR    Component Value Date/Time   INR 1.5 (H) 09/25/2022 1801     Intake/Output Summary (Last 24 hours) at 11/17/2022 1732 Last data filed at 11/17/2022 1500 Gross per 24 hour  Intake 470 ml  Output 1750 ml  Net -1280 ml     Assessment/Plan:  62 y.o. male is s/p right BKA stump revision with wound washout of infection.  3 Days Post-Op   Patient has a history of acute osteomyelitis and post status right BKA.  Patient had presented to the hospital with 3 days of gradually worsening right stump erythema and pain.  Initial concern was for cellulitis however upon examination wound was probed down to the bone with extraction of 200 cc of purulent drainage more consistent with osteomyelitis.  On 11/14/2022 patient was taken back to the operating room for BKA stump revision with wound washout of infection.  Wound VAC was placed at this time.  Patient is scheduled to go back to the operating room tomorrow on 1 07/13/2023 for wound VAC change and second  wound washout.  I discussed the procedure the benefits the risk and complications with Shane Chambers this afternoon.  He verbalizes understanding and wishes to proceed with the procedure.  Patient will be made n.p.o. after midnight.   Drema Pry [facility] 11/17/2022 5:32 PM

## 2022-11-17 NOTE — Progress Notes (Signed)
Progress Note    11/17/2022 5:32 PM 3 Days Post-Op  Subjective:  Shane Chambers is a 62 y.o. male with past medical history of CAD s/p CABG, HFrEF with recovered EF, PVD, type 2 diabetes, COPD, hypertension, hyperlipidemia, hypothyroidism, depression who is current he admitted for suicidal ideation and found to have complication of his amputation site.   On 11/14/2022 patient was taken to the operating room for right below the knee amputation revision with incision and drainage of a stump infection.  Infection was washed out in the operating room.  Patient's incisional wound was left open for wound VAC placement.  Wound VAC is operating well patient is recovering as expected.  Patient remains on IV antibiotics.  No complications to note and vitals all remained stable.  Plan is to take the patient back to the operating room tomorrow for wound VAC change and second wound washout.   Vitals:   11/17/22 0824 11/17/22 1540  BP: 123/67 (!) 113/55  Pulse: (!) 58 (!) 58  Resp: 16 18  Temp: 97.7 F (36.5 C) 98 F (36.7 C)  SpO2: 98% 99%   Physical Exam: Cardiac:  RRR Lungs:  Clear but diminished in the bases  Incisions:  Clean dry and intact with wound vac in place in unclosed incision.  Extremities:  Right BKA with wound vac post wash out.  Abdomen: Positive bowel sounds soft nontender nondistended. Neurologic: And oriented x 3 person place and time.  CBC    Component Value Date/Time   WBC 9.6 11/16/2022 0447   RBC 2.70 (L) 11/16/2022 0447   HGB 7.9 (L) 11/16/2022 0447   HCT 24.5 (L) 11/16/2022 0447   PLT 323 11/16/2022 0447   MCV 90.7 11/16/2022 0447   MCH 29.3 11/16/2022 0447   MCHC 32.2 11/16/2022 0447   RDW 15.6 (H) 11/16/2022 0447   LYMPHSABS 1.5 11/10/2022 1916   MONOABS 1.0 11/10/2022 1916   EOSABS 0.7 (H) 11/10/2022 1916   BASOSABS 0.1 11/10/2022 1916    BMET    Component Value Date/Time   NA 137 11/16/2022 0447   K 4.1 11/16/2022 0447   CL 102 11/16/2022 0447    CO2 25 11/16/2022 0447   GLUCOSE 132 (H) 11/16/2022 0447   BUN 63 (H) 11/16/2022 0447   CREATININE 1.25 (H) 11/17/2022 0238   CALCIUM 8.8 (L) 11/16/2022 0447   GFRNONAA >60 11/17/2022 0238   GFRAA >60 08/07/2020 0255    INR    Component Value Date/Time   INR 1.5 (H) 09/25/2022 1801     Intake/Output Summary (Last 24 hours) at 11/17/2022 1732 Last data filed at 11/17/2022 1500 Gross per 24 hour  Intake 470 ml  Output 1750 ml  Net -1280 ml     Assessment/Plan:  62 y.o. male is s/p right BKA stump revision with wound washout of infection.  3 Days Post-Op   Patient has a history of acute osteomyelitis and post status right BKA.  Patient had presented to the hospital with 3 days of gradually worsening right stump erythema and pain.  Initial concern was for cellulitis however upon examination wound was probed down to the bone with extraction of 200 cc of purulent drainage more consistent with osteomyelitis.  On 11/14/2022 patient was taken back to the operating room for BKA stump revision with wound washout of infection.  Wound VAC was placed at this time.  Patient is scheduled to go back to the operating room tomorrow on 1 07/13/2023 for wound VAC change and second  wound washout.  I discussed the procedure the benefits the risk and complications with Shane Chambers this afternoon.  He verbalizes understanding and wishes to proceed with the procedure.  Patient will be made n.p.o. after midnight.   Drema Pry [facility] 11/17/2022 5:32 PM

## 2022-11-17 NOTE — Care Management Important Message (Signed)
Important Message  Patient Details  Name: Shane Chambers MRN: [id number] Date of Birth: 11-28-1960   Medicare Important Message Given:  Yes  Patient asleep upon time of visit.  Copy of Medicare IM left in room for reference.   Dannette Barbara 11/17/2022, 10:43 AM

## 2022-11-17 NOTE — Consult Note (Signed)
NAME: Shane Chambers  DOB: 1960/12/18  MRN: [id number]  Date/Time: 11/17/2022 3:31 PM  REQUESTING PROVIDER: Dr.Kumar Subjective:  REASON FOR CONSULT: MRSA infection of Rt BKA stump ? Shane Chambers is a 62 y.o. 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 He presented to [facility] on 10/27/22 from long term care with suicidal ideation. HE was transferred to [facility] on 11/05/22 to Geropsych- HE was moved to Medical side because of infection of the rt BKA sit e on 11/11/22. HE underwent excisionla debridement of the BKA on 0/8/67 with application of wound vac. The culture is positive for MRSA and I am seeing the patient for  the same. He has bene on vanco , ceftriaxone and flagyl HE is colonized with MRSA He has no fever or chills HE says he sometimes picks on his wounds HE has a scab on the left shin from picking on the skin  Past Medical History:  Diagnosis Date   Anemia    Arthritis    CAD (coronary artery disease)    a. s/p CABG in 03/2020 with LIMA-LAD, RIMA-PL, RA-D1-RI-OM1   Cancer ([place])    rectal   Cellulitis    COPD (chronic obstructive pulmonary disease) ([place])    Depression    Diabetes mellitus without complication (HCC)    GERD (gastroesophageal reflux disease)    History of kidney stones    Hyperlipidemia 12/01/2019   Hypertension    Hypothyroidism    Ischemic cardiomyopathy    a. EF 20-25% by echo in 02/2020 b. at 40% by echo in 03/2020 c. EF normalized to 60-65% by echo in 04/2020   Myocardial infarction [facility])    Peripheral vascular disease ([place])    Sleep apnea    Type 2 diabetes mellitus ([place])     Past Surgical History:  Procedure Laterality Date   AMPUTATION Right 10/05/2022   Procedure: AMPUTATION BELOW KNEE;  Surgeon: Newt Minion, MD;   Location: [place];  Service: Orthopedics;  Laterality: Right;   AMPUTATION Right 11/14/2022   Procedure: AMPUTATION BELOW KNEE REVISION; WASHOUT;  Surgeon: Katha Cabal, MD;  Location: [facility];  Service: Vascular;  Laterality: Right;   APPLICATION OF WOUND VAC Right 10/05/2022   Procedure: APPLICATION OF WOUND VAC;  Surgeon: Newt Minion, MD;  Location: [place];  Service: Orthopedics;  Laterality: Right;   BIOPSY  07/18/2021   Procedure: BIOPSY;  Surgeon: Eloise Harman, DO;  [facility];  Service: Endoscopy;;   BIOPSY  01/09/2022   Procedure: BIOPSY;  Surgeon: Eloise Harman, DO;  [facility];  Service: Endoscopy;;   COLONOSCOPY WITH PROPOFOL N/A 07/18/2021   Carver: 15 millimeter polyp removed from the sigmoid colon, 5 mm polyp removed from sigmoid colon.  Nonbleeding internal hemorrhoids.  Significant looping of the colon. sigmoid path showed invasive colonic adenocarcinoma involving tubular adenoma (invades to depth of 71m, carcinoma 146mfrom margin, no lymphovascular invasion, no poorly differentiated component.   CORONARY ARTERY BYPASS GRAFT N/A 03/13/2020   Procedure: CORONARY ARTERY BYPASS GRAFTING (CABG) times five using bilateral Internal mammary arteries and left radial artery;  Surgeon: AtWonda OldsMD;  [place] Service: Open Heart Surgery;  Laterality: N/A;   DENTAL  SURGERY     ESOPHAGOGASTRODUODENOSCOPY (EGD) WITH PROPOFOL N/A 07/18/2021   Carver: 1 gastric polyp status post biopsy, gastritis. gastric bx with slight chronic inflammation and no H.pyori. GEJ polypectomy with mild inflammation only   FLEXIBLE SIGMOIDOSCOPY N/A 08/26/2021   Carver: Nonbleeding internal hemorrhoids.  15 mm ulcers from previous polypectomy found in the rectum.  No evidence of previous polyp.  Located 5 to 8 cm from anal verge.   FLEXIBLE SIGMOIDOSCOPY N/A 01/09/2022   Procedure: FLEXIBLE SIGMOIDOSCOPY;  Surgeon: Eloise Harman, DO;  [facility];   Service: Endoscopy;  Laterality: N/A;   POLYPECTOMY  07/18/2021   Procedure: POLYPECTOMY INTESTINAL;  Surgeon: Eloise Harman, DO;  [facility];  Service: Endoscopy;;   RADIAL ARTERY HARVEST Left 03/13/2020   Procedure: [place],;  Surgeon: [name], MD;  Location: [place];  Service: Open Heart Surgery;  Laterality: Left;   RIGHT/LEFT HEART CATH AND CORONARY ANGIOGRAPHY N/A 03/07/2020   Procedure: RIGHT/LEFT HEART CATH AND CORONARY ANGIOGRAPHY;  Surgeon: Sherren Mocha, MD;  Location: [place] [facility];  Service: Cardiovascular;  Laterality: N/A;   SUBMUCOSAL LIFTING INJECTION  01/09/2022   Procedure: SUBMUCOSAL LIFTING INJECTION;  Surgeon: Eloise Harman, DO;  [facility];  Service: Endoscopy;;   SUBMUCOSAL TATTOO INJECTION  01/09/2022   Procedure: SUBMUCOSAL TATTOO INJECTION;  Surgeon: Eloise Harman, DO;  [facility];  Service: Endoscopy;;   TEE WITHOUT CARDIOVERSION N/A 03/13/2020   Procedure: TRANSESOPHAGEAL ECHOCARDIOGRAM (TEE);  Surgeon: [name], MD;  Location: [place];  Service: Open Heart Surgery;  Laterality: N/A;    Social History   Socioeconomic History   Marital status: Single    Spouse name: Not on file   Number of children: Not on file   Years of education: Not on file   Highest education level: Not on file  Occupational History   Not on file  Tobacco Use   Smoking status: Former    Packs/day: 1.50    Types: Cigarettes    Quit date: 05/25/1995    Years since quitting: 27.5   Smokeless tobacco: Never  Vaping Use   Vaping Use: Never used  Substance and Sexual Activity   Alcohol use: Not Currently    Comment: rarely   Drug use: No   Sexual activity: Not on file  Other Topics Concern   Not on file  Social History Narrative   Not on file   Social Determinants of Health   Financial Resource Strain: Not on file  Food Insecurity: No Food Insecurity (11/13/2022)   Hunger Vital Sign    Worried  About Running Out of Food in the Last Year: Never true    Ran Out of Food in the Last Year: Never true  Transportation Needs: No Transportation Needs (11/13/2022)   PRAPARE - Hydrologist (Medical): No    Lack of Transportation (Non-Medical): No  Physical Activity: Not on file  Stress: Not on file  Social Connections: Not on file  Intimate Partner Violence: Not At Risk (11/13/2022)   Humiliation, Afraid, Rape, and Kick questionnaire    Fear of Current or Ex-Partner: No    Emotionally Abused: No    Physically Abused: No    Sexually Abused: No    Family History  Problem Relation Age of Onset   Hypertension Mother    Allergies  Allergen Reactions   Ace Inhibitors Swelling and Cough    (Not  on MAR at [facility] and [facility])   Haloperidol And Related     Do NOT give anti-psychotics due to risk of  Torsades and QT prolongation (per Dr. Graciela Husbands request)   Other Itching    [name]   Shellfish Allergy    Penicillins Itching and Rash    Has patient had a PCN reaction causing immediate rash, facial/tongue/throat swelling, SOB or lightheadedness with hypotension: Unknown Has patient had a PCN reaction causing severe rash involving mucus membranes or skin necrosis: Unknown Has patient had a PCN reaction that required hospitalization: Unknown Has patient had a PCN reaction occurring within the last 10 years: Unknown If all of the above answers are "NO", then may proceed with Cephalosporin use.    I? Current Facility-Administered Medications  Medication Dose Route Frequency Provider Last Rate Last Admin   acetaminophen (TYLENOL) tablet 1,000 mg  1,000 mg Oral Q8H PRN Schnier, Dolores Lory, MD   1,000 mg at 11/16/22 2113   albuterol (PROVENTIL) (2.5 MG/3ML) 0.083% nebulizer solution 2.5 mg  2.5 mg Inhalation Q6H PRN Schnier, Dolores Lory, MD       amLODipine (NORVASC) tablet 10 mg  10 mg Oral Daily Schnier, Dolores Lory, MD   10 mg at 11/17/22 0957    ARIPiprazole (ABILIFY) tablet 10 mg  10 mg Oral Daily Schnier, Dolores Lory, MD   10 mg at 11/17/22 5361   ascorbic acid (VITAMIN C) tablet 500 mg  500 mg Oral BID Katha Cabal, MD   500 mg at 11/17/22 0957   aspirin EC tablet 81 mg  81 mg Oral Q breakfast Katha Cabal, MD   81 mg at 11/17/22 0957   atorvastatin (LIPITOR) tablet 80 mg  80 mg Oral QHS Schnier, Dolores Lory, MD   80 mg at 11/16/22 2114   bisacodyl (DULCOLAX) EC tablet 10 mg  10 mg Oral QHS Schnier, Dolores Lory, MD   10 mg at 11/16/22 2114   clotrimazole (LOTRIMIN) 1 % cream   Topical BID Schnier, Dolores Lory, MD   Given at 11/17/22 0958   enoxaparin (LOVENOX) injection 55 mg  0.5 mg/kg Subcutaneous Q24H Noralee Space, RPH   55 mg at 11/17/22 0954   fentaNYL (SUBLIMAZE) injection 12.5 mcg  12.5 mcg Intravenous Once PRN Schnier, Dolores Lory, MD       ferrous gluconate (FERGON) tablet 324 mg  324 mg Oral Daily Schnier, Dolores Lory, MD   324 mg at 11/17/22 0959   fluticasone furoate-vilanterol (BREO ELLIPTA) 200-25 MCG/ACT 1 puff  1 puff Inhalation Daily Schnier, Dolores Lory, MD   1 puff at 11/17/22 0959   gabapentin (NEURONTIN) capsule 600 mg  600 mg Oral BID Val Riles, MD   600 mg at 11/17/22 4431   hydrALAZINE (APRESOLINE) tablet 50 mg  50 mg Oral Q6H PRN Schnier, Dolores Lory, MD       HYDROmorphone (DILAUDID) injection 1 mg  1 mg Intravenous Once PRN Schnier, Dolores Lory, MD       insulin aspart (novoLOG) injection 0-15 Units  0-15 Units Subcutaneous TID AC & HS Schnier, Dolores Lory, MD   2 Units at 11/16/22 2123   ipratropium-albuterol (DUONEB) 0.5-2.5 (3) MG/3ML nebulizer solution 3 mL  3 mL Nebulization Q4H PRN Schnier, Dolores Lory, MD       levothyroxine (SYNTHROID) tablet 75 mcg  75 mcg Oral Q0600 Katha Cabal, MD   75 mcg at 11/17/22 0626   loperamide HCl (IMODIUM) 1 MG/7.5ML suspension 4  mg  4 mg Oral Q6H PRN Schnier, Dolores Lory, MD       magnesium oxide (MAG-OX) tablet 400 mg  400 mg Oral Daily Schnier, Dolores Lory, MD    400 mg at 11/17/22 0956   metoprolol tartrate (LOPRESSOR) tablet 50 mg  50 mg Oral BID Katha Cabal, MD   50 mg at 11/17/22 0956   metroNIDAZOLE (FLAGYL) IVPB 500 mg  500 mg Intravenous Q12H Katha Cabal, MD 100 mL/hr at 11/17/22 0625 500 mg at 11/17/22 1829   mineral oil enema 1 enema  1 enema Rectal Once Val Riles, MD       multivitamin with minerals tablet 1 tablet  1 tablet Oral Daily Schnier, Dolores Lory, MD   1 tablet at 11/17/22 0957   ondansetron (ZOFRAN) injection 4 mg  4 mg Intravenous Q6H PRN Schnier, Dolores Lory, MD       Oral care mouth rinse  15 mL Mouth Rinse PRN Schnier, Dolores Lory, MD       oxyCODONE (Oxy IR/ROXICODONE) immediate release tablet 5 mg  5 mg Oral Q4H PRN Schnier, Dolores Lory, MD   5 mg at 11/15/22 1200   pantoprazole (PROTONIX) EC tablet 40 mg  40 mg Oral Daily Schnier, Dolores Lory, MD   40 mg at 11/17/22 0956   polyethylene glycol (MIRALAX / GLYCOLAX) packet 17 g  17 g Oral BID Katha Cabal, MD   17 g at 11/17/22 9371   polyvinyl alcohol (LIQUIFILM TEARS) 1.4 % ophthalmic solution 1 drop  1 drop Both Eyes Daily Schnier, Dolores Lory, MD   1 drop at 11/15/22 6967   potassium chloride SA (KLOR-CON M) CR tablet 20 mEq  20 mEq Oral Daily Schnier, Dolores Lory, MD   20 mEq at 11/17/22 8938   protein supplement (ENSURE MAX) liquid  11 oz Oral TID Katha Cabal, MD   11 oz at 11/17/22 1000   sodium chloride flush (NS) 0.9 % injection 3 mL  3 mL Intravenous Q12H Schnier, Dolores Lory, MD   3 mL at 11/17/22 1000   tamsulosin (FLOMAX) capsule 0.8 mg  0.8 mg Oral Daily Schnier, Dolores Lory, MD   0.8 mg at 11/17/22 1017   thiamine (VITAMIN B1) tablet 100 mg  100 mg Oral Daily Katha Cabal, MD   100 mg at 11/17/22 0956   torsemide (DEMADEX) tablet 40 mg  40 mg Oral Daily Katha Cabal, MD   40 mg at 11/17/22 0956   [START ON 11/18/2022] vancomycin (VANCOREADY) IVPB 1750 mg/350 mL  1,750 mg Intravenous Q48H Dallie Piles, RPH       venlafaxine XR (EFFEXOR-XR) 24  hr capsule 150 mg  150 mg Oral Q breakfast Schnier, Dolores Lory, MD   150 mg at 11/17/22 0957   Vitamin D (Ergocalciferol) (DRISDOL) 1.25 MG (50000 UNIT) capsule 50,000 Units  50,000 Units Oral Q7 days Katha Cabal, MD   50,000 Units at 11/12/22 1823   zinc sulfate capsule 220 mg  220 mg Oral Daily Schnier, Dolores Lory, MD   220 mg at 11/17/22 0956     Abtx:  Anti-infectives (From admission, onward)    Start     Dose/Rate Route Frequency Ordered Stop   11/18/22 1800  vancomycin (VANCOREADY) IVPB 1750 mg/350 mL        1,750 mg 175 mL/hr over 120 Minutes Intravenous Every 48 hours 11/17/22 1319     11/17/22 0430  vancomycin (VANCOREADY) IVPB 500 mg/100 mL  500 mg 100 mL/hr over 60 Minutes Intravenous  Once 11/17/22 0340 11/17/22 0621   11/16/22 1020  vancomycin variable dose per unstable renal function (pharmacist dosing)  Status:  Discontinued         Intravenous See admin instructions 11/16/22 1024 11/17/22 1319   11/12/22 2000  vancomycin (VANCOREADY) IVPB 750 mg/150 mL  Status:  Discontinued       See Hyperspace for full Linked Orders Report.   750 mg 150 mL/hr over 60 Minutes Intravenous Every 12 hours 11/12/22 1247 11/16/22 1020   11/11/22 2000  vancomycin (VANCOCIN) IVPB 1000 mg/200 mL premix  Status:  Discontinued       See Hyperspace for full Linked Orders Report.   1,000 mg 200 mL/hr over 60 Minutes Intravenous Every 12 hours 11/11/22 0918 11/12/22 1247   11/11/22 0515  vancomycin (VANCOCIN) IVPB 1000 mg/200 mL premix  Status:  Discontinued       See Hyperspace for full Linked Orders Report.   1,000 mg 200 mL/hr over 60 Minutes Intravenous Every 24 hours 11/11/22 0417 11/11/22 0918   11/11/22 0515  vancomycin (VANCOREADY) IVPB 1500 mg/300 mL  Status:  Discontinued       See Hyperspace for full Linked Orders Report.   1,500 mg 150 mL/hr over 120 Minutes Intravenous Every 24 hours 11/11/22 0417 11/11/22 0918   11/11/22 0500  metroNIDAZOLE (FLAGYL) IVPB 500 mg         500 mg 100 mL/hr over 60 Minutes Intravenous Every 12 hours 11/11/22 0412     11/11/22 0500  cefTRIAXone (ROCEPHIN) 2 g in sodium chloride 0.9 % 100 mL IVPB  Status:  Discontinued        2 g 200 mL/hr over 30 Minutes Intravenous Every 24 hours 11/11/22 0412 11/17/22 1510       REVIEW OF SYSTEMS:  Const: negative fever, negative chills, negative weight loss Eyes: negative diplopia or visual changes, negative eye pain ENT: negative coryza, negative sore throat Resp: negative cough, hemoptysis, dyspnea Cards: negative for chest pain, palpitations, lower extremity edema GU: negative for frequency, dysuria and hematuria GI: Negative for abdominal pain, diarrhea, bleeding, constipation Skin: negative for rash and pruritus Heme: negative for easy bruising and gum/nose bleeding MS: rtBka Neurolo:negative for headaches, dizziness, vertigo, memory problems  Psych:  anxiety, depression  Endocrine: , diabetes Allergy/Immunology- PCN allergy and other allergies Objective:  VITALS:  BP 123/67 (BP Location: Left Arm)   Pulse (!) 58   Temp 97.7 F (36.5 C) (Oral)   Resp 16   Wt 111.4 kg   SpO2 98%   BMI 36.27 kg/m   PHYSICAL EXAM:  General: Alert, cooperative, no distress, appears stated age.  Head: Normocephalic, without obvious abnormality, atraumatic. Eyes: Conjunctivae clear, anicteric sclerae. Pupils are equal ENT Nares normal. No drainage or sinus tenderness. Lips, mucosa, and tongue normal. No Thrush Neck: Supple, symmetrical, no adenopathy, thyroid: non tender no carotid bruit and no JVD. Back: No CVA tenderness. Lungs: Clear to auscultation bilaterally. No Wheezing or Rhonchi. No rales. Heart: Regular rate and rhythm, no murmur, rub or gallop. Abdomen: Soft, non-tender,not distended. Bowel sounds normal. No masses Extremities: rt BKA site covered with vac Pictures reviewed     Left leg edematous - venous stasis and staining shin  Skin: No rashes or lesions. Or  bruising Lymph: Cervical, supraclavicular normal. Neurologic: Grossly non-focal Pertinent Labs Lab Results CBC    Component Value Date/Time   WBC 9.6 11/16/2022 0447   RBC 2.70 (L) 11/16/2022 0447  HGB 7.9 (L) 11/16/2022 0447   HCT 24.5 (L) 11/16/2022 0447   PLT 323 11/16/2022 0447   MCV 90.7 11/16/2022 0447   MCH 29.3 11/16/2022 0447   MCHC 32.2 11/16/2022 0447   RDW 15.6 (H) 11/16/2022 0447   LYMPHSABS 1.5 11/10/2022 1916   MONOABS 1.0 11/10/2022 1916   EOSABS 0.7 (H) 11/10/2022 1916   BASOSABS 0.1 11/10/2022 1916       Latest Ref Rng & Units 11/17/2022    2:38 AM 11/16/2022    4:47 AM 11/15/2022    4:54 AM  CMP  Glucose 70 - 99 mg/dL  132  157   BUN 6 - 20 mg/dL  63  61   Creatinine 0.61 - 1.24 mg/dL 1.25  1.18  1.26   Sodium 135 - 145 mmol/L  137  135   Potassium 3.5 - 5.1 mmol/L  4.1  4.3   Chloride 98 - 111 mmol/L  102  102   CO2 22 - 32 mmol/L  25  22   Calcium 8.9 - 10.3 mg/dL  8.8  9.0       Microbiology: Recent Results (from the past 240 hour(s))  MRSA Next Gen by PCR, Nasal     Status: Abnormal   Collection Time: 11/11/22  5:20 AM   Specimen: Nasal Mucosa; Nasal Swab  Result Value Ref Range Status   MRSA by PCR Next Gen DETECTED (A) NOT DETECTED Final    Comment: CRITICAL RESULT CALLED TO, READ BACK BY AND VERIFIED WITH: DAWN SONGSCER RN '@[id number]'$  11/11/22 ASW (NOTE) The GeneXpert MRSA Assay (FDA approved for NASAL specimens only), is one component of a comprehensive MRSA colonization surveillance program. It is not intended to diagnose MRSA infection nor to guide or monitor treatment for MRSA infections. Test performance is not FDA approved in patients less than 50 years old. Performed at [facility], [address] [place], [place] [address]   Aerobic/Anaerobic Culture w Gram Stain (surgical/deep wound)     Status: None (Preliminary result)   Collection Time: 11/14/22 11:19 AM   Specimen: PATH Other; Tissue  Result Value Ref Range Status    Specimen Description   Final    WOUND RIGHT BKA Performed at [facility], [name], [place], [place] [address]    Special Requests   Final    NONE Performed at [facility], [address] [place], [place] [address]    Gram Stain   Final    RARE WBC PRESENT, PREDOMINANTLY PMN NO ORGANISMS SEEN Performed at [facility], [place] [address] [place], [place] [address]    Culture   Final    RARE METHICILLIN RESISTANT STAPHYLOCOCCUS AUREUS NO ANAEROBES ISOLATED; CULTURE IN PROGRESS FOR 5 DAYS    Report Status PENDING  Incomplete   Organism ID, Bacteria METHICILLIN RESISTANT STAPHYLOCOCCUS AUREUS  Final      Susceptibility   Methicillin resistant staphylococcus aureus - MIC*    CIPROFLOXACIN >=8 RESISTANT Resistant     ERYTHROMYCIN >=8 RESISTANT Resistant     GENTAMICIN <=0.5 SENSITIVE Sensitive     OXACILLIN >=4 RESISTANT Resistant     TETRACYCLINE >=16 RESISTANT Resistant     VANCOMYCIN 1 SENSITIVE Sensitive     TRIMETH/SULFA <=10 SENSITIVE Sensitive     CLINDAMYCIN >=8 RESISTANT Resistant     RIFAMPIN <=0.5 SENSITIVE Sensitive     Inducible Clindamycin NEGATIVE Sensitive     * RARE METHICILLIN RESISTANT STAPHYLOCOCCUS AUREUS    IMAGING RESULTS: None   ?  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

## 2022-11-17 NOTE — Progress Notes (Signed)
       CROSS COVER NOTE  NAME: ANUBIS FUNDORA MRN: [id number] DOB : November 26, 1960 ATTENDING PHYSICIAN: Val Riles, MD    Date of Service   11/17/2022   HPI/Events of Note   Patient request  Interventions   Assessment/Plan: Dulcolax suppository X X    *** professional thanks      To reach the provider On-Call:   7AM- 7PM see care teams to locate the attending and reach out to them via [email or web address]. 7PM-7AM contact night-coverage If you still have difficulty reaching the appropriate provider, please page the [facility] (Director on Call) for [facility] on amion for assistance  This document was prepared using Set designer software and may include unintentional dictation errors.  Neomia Glass DNP, MBA, FNP-BC Nurse Practitioner [facility] Pager [phone]

## 2022-11-18 ENCOUNTER — Encounter: Payer: Self-pay | Admitting: Internal Medicine

## 2022-11-18 ENCOUNTER — Other Ambulatory Visit: Payer: Self-pay

## 2022-11-18 ENCOUNTER — Inpatient Hospital Stay: Payer: Medicare Other | Admitting: Certified Registered"

## 2022-11-18 ENCOUNTER — Encounter: Admission: AD | Disposition: A | Discharge status: Skilled Nursing Facility | Payer: Self-pay | Attending: Student

## 2022-11-18 DIAGNOSIS — M8610 Other acute osteomyelitis, unspecified site: Secondary | ICD-10-CM | POA: Diagnosis not present

## 2022-11-18 HISTORY — PX: AMPUTATION: SHX166

## 2022-11-18 LAB — CBC
HCT: 26.6 % — ABNORMAL LOW (ref 39.0–52.0)
Hemoglobin: 8.5 g/dL — ABNORMAL LOW (ref 13.0–17.0)
MCH: 29.2 pg (ref 26.0–34.0)
MCHC: 32 g/dL (ref 30.0–36.0)
MCV: 91.4 fL (ref 80.0–100.0)
Platelets: 362 10*3/uL (ref 150–400)
RBC: 2.91 MIL/uL — ABNORMAL LOW (ref 4.22–5.81)
RDW: 15.4 % (ref 11.5–15.5)
WBC: 12.6 10*3/uL — ABNORMAL HIGH (ref 4.0–10.5)
nRBC: 0 % (ref 0.0–0.2)

## 2022-11-18 LAB — VITAMIN C: Vitamin C: 1.4 mg/dL (ref 0.4–2.0)

## 2022-11-18 LAB — BASIC METABOLIC PANEL
Anion gap: 9 (ref 5–15)
BUN: 64 mg/dL — ABNORMAL HIGH (ref 6–20)
CO2: 26 mmol/L (ref 22–32)
Calcium: 8.9 mg/dL (ref 8.9–10.3)
Chloride: 101 mmol/L (ref 98–111)
Creatinine, Ser: 1.11 mg/dL (ref 0.61–1.24)
GFR, Estimated: >60 mL/min (ref >60)
Glucose, Bld: 158 mg/dL — ABNORMAL HIGH (ref 70–99)
Potassium: 4.1 mmol/L (ref 3.5–5.1)
Sodium: 136 mmol/L (ref 135–145)

## 2022-11-18 LAB — GLUCOSE, CAPILLARY
Glucose-Capillary: 120 mg/dL — ABNORMAL HIGH (ref 70–99)
Glucose-Capillary: 134 mg/dL — ABNORMAL HIGH (ref 70–99)
Glucose-Capillary: 127 mg/dL — ABNORMAL HIGH (ref 70–99)
Glucose-Capillary: 142 mg/dL — ABNORMAL HIGH (ref 70–99)
Glucose-Capillary: 207 mg/dL — ABNORMAL HIGH (ref 70–99)

## 2022-11-18 LAB — VANCOMYCIN, RANDOM: Vancomycin Rm: 15 ug/mL

## 2022-11-18 LAB — PHOSPHORUS: Phosphorus: 3.3 mg/dL (ref 2.5–4.6)

## 2022-11-18 LAB — MAGNESIUM: Magnesium: 1.8 mg/dL (ref 1.7–2.4)

## 2022-11-18 LAB — TYPE AND SCREEN
ABO/RH(D): A POS
Antibody Screen: NEGATIVE

## 2022-11-18 SURGERY — AMPUTATION BELOW KNEE
Anesthesia: General | Site: Knee | Laterality: Right

## 2022-11-18 MED ORDER — OXYCODONE HCL 5 MG PO TABS: 5.0000 mg | ORAL_TABLET | Freq: Once | ORAL | Status: AC | PRN

## 2022-11-18 MED ORDER — LACTATED RINGERS IV SOLN: INTRAVENOUS | Status: DC

## 2022-11-18 MED ORDER — PHENYLEPHRINE HCL (PRESSORS) 10 MG/ML IV SOLN
INTRAVENOUS | Status: DC | PRN
  Administered 2022-11-18: 200 ug via INTRAVENOUS
  Administered 2022-11-18: 100 ug via INTRAVENOUS

## 2022-11-18 MED ORDER — LACTATED RINGERS IV SOLN: INTRAVENOUS | Status: DC | PRN

## 2022-11-18 MED ORDER — PROPOFOL 10 MG/ML IV BOLUS
INTRAVENOUS | Status: DC | PRN
  Administered 2022-11-18 (×2): 100 mg via INTRAVENOUS

## 2022-11-18 MED ORDER — SODIUM CHLORIDE 0.9 % IR SOLN
Status: DC | PRN
  Administered 2022-11-18: 3000 mL

## 2022-11-18 MED ORDER — 0.9 % SODIUM CHLORIDE (POUR BTL) OPTIME
TOPICAL | Status: DC | PRN
  Administered 2022-11-18: 1000 mL

## 2022-11-18 MED ORDER — OXYCODONE HCL 5 MG PO TABS
ORAL_TABLET | ORAL | Status: AC
  Administered 2022-11-18: 5 mg via ORAL
  Filled 2022-11-18: qty 1

## 2022-11-18 MED ORDER — ATROPINE SULFATE 0.4 MG/ML IV SOLN
INTRAVENOUS | Status: AC
  Filled 2022-11-18: qty 1

## 2022-11-18 MED ORDER — BUPIVACAINE LIPOSOME 1.3 % IJ SUSP
INTRAMUSCULAR | Status: AC
  Filled 2022-11-18: qty 20

## 2022-11-18 MED ORDER — ACETAMINOPHEN 10 MG/ML IV SOLN: 1000.0000 mg | Freq: Once | INTRAVENOUS | Status: DC | PRN

## 2022-11-18 MED ORDER — LIDOCAINE HCL (CARDIAC) PF 100 MG/5ML IV SOSY: PREFILLED_SYRINGE | INTRAVENOUS | Status: DC | PRN

## 2022-11-18 MED ORDER — FENTANYL CITRATE (PF) 100 MCG/2ML IJ SOLN
INTRAMUSCULAR | Status: DC | PRN
  Administered 2022-11-18: 25 ug via INTRAVENOUS
  Administered 2022-11-18: 50 ug via INTRAVENOUS
  Administered 2022-11-18: 25 ug via INTRAVENOUS

## 2022-11-18 MED ORDER — FENTANYL CITRATE (PF) 100 MCG/2ML IJ SOLN
INTRAMUSCULAR | Status: AC
  Filled 2022-11-18: qty 2

## 2022-11-18 MED ORDER — PROPOFOL 10 MG/ML IV BOLUS: INTRAVENOUS | Status: DC | PRN

## 2022-11-18 MED ORDER — DEXMEDETOMIDINE HCL IN NACL 200 MCG/50ML IV SOLN: INTRAVENOUS | Status: DC | PRN

## 2022-11-18 MED ORDER — ATROPINE SULFATE 1 MG/ML IV SOLN
INTRAVENOUS | Status: DC | PRN
  Administered 2022-11-18: .1 mg via INTRAVENOUS

## 2022-11-18 MED ORDER — OXYCODONE HCL 5 MG/5ML PO SOLN: 5.0000 mg | Freq: Once | ORAL | Status: AC | PRN

## 2022-11-18 MED ORDER — SODIUM CHLORIDE (PF) 0.9 % IJ SOLN
INTRAMUSCULAR | Status: AC
  Filled 2022-11-18: qty 50

## 2022-11-18 MED ORDER — FENTANYL CITRATE (PF) 100 MCG/2ML IJ SOLN: 25.0000 ug | INTRAMUSCULAR | Status: DC | PRN

## 2022-11-18 MED ORDER — VASOPRESSIN 20 UNIT/ML IV SOLN
INTRAVENOUS | Status: DC | PRN
  Administered 2022-11-18: 1 [IU] via INTRAVENOUS

## 2022-11-18 MED ORDER — LIDOCAINE HCL (CARDIAC) PF 100 MG/5ML IV SOSY
PREFILLED_SYRINGE | INTRAVENOUS | Status: DC | PRN
  Administered 2022-11-18: 100 mg via INTRAVENOUS

## 2022-11-18 MED ORDER — BUPIVACAINE HCL (PF) 0.5 % IJ SOLN
INTRAMUSCULAR | Status: AC
  Filled 2022-11-18: qty 30

## 2022-11-18 MED ORDER — GLYCOPYRROLATE 0.2 MG/ML IJ SOLN
INTRAMUSCULAR | Status: DC | PRN
  Administered 2022-11-18: .2 mg via INTRAVENOUS

## 2022-11-18 MED ORDER — EPHEDRINE SULFATE (PRESSORS) 50 MG/ML IJ SOLN
INTRAMUSCULAR | Status: DC | PRN
  Administered 2022-11-18 (×2): 10 mg via INTRAVENOUS

## 2022-11-18 SURGICAL SUPPLY — 69 items
ADH SKN CLS APL DERMABOND .7 (GAUZE/BANDAGES/DRESSINGS) ×2
APL PRP STRL LF DISP 70% ISPRP (MISCELLANEOUS)
BAG COUNTER SPONGE SURGICOUNT (BAG) ×2 IMPLANT
BAG SPNG CNTER NS LX DISP (BAG)
BLADE SAGITTAL WIDE XTHICK NO (BLADE) ×2 IMPLANT
BLADE SURG SZ10 CARB STEEL (BLADE) ×2 IMPLANT
BNDG CMPR 5X4 CHSV STRCH STRL (GAUZE/BANDAGES/DRESSINGS) ×1
BNDG CMPR STD VLCR NS LF 5.8X4 (GAUZE/BANDAGES/DRESSINGS) ×1
BNDG COHESIVE 4X5 TAN STRL LF (GAUZE/BANDAGES/DRESSINGS) ×2 IMPLANT
BNDG ELASTIC 4X5.8 VLCR NS LF (GAUZE/BANDAGES/DRESSINGS) ×2 IMPLANT
BNDG ELASTIC 6X5.8 VLCR STR LF (GAUZE/BANDAGES/DRESSINGS) IMPLANT
BNDG GAUZE DERMACEA FLUFF 4 (GAUZE/BANDAGES/DRESSINGS) ×2 IMPLANT
BNDG GZE DERMACEA 4 6PLY (GAUZE/BANDAGES/DRESSINGS) ×3
BRUSH SCRUB EZ  4% CHG (MISCELLANEOUS)
BRUSH SCRUB EZ 4% CHG (MISCELLANEOUS) ×2 IMPLANT
CANISTER WOUND CARE 500ML ATS (WOUND CARE) IMPLANT
CHLORAPREP W/TINT 26 (MISCELLANEOUS) ×2 IMPLANT
DERMABOND ADVANCED .7 DNX12 (GAUZE/BANDAGES/DRESSINGS) ×4 IMPLANT
DRAPE INCISE IOBAN 66X45 STRL (DRAPES) ×2 IMPLANT
DRSG GAUZE FLUFF 36X18 (GAUZE/BANDAGES/DRESSINGS) ×2 IMPLANT
DRSG VAC GRANUFOAM MED (GAUZE/BANDAGES/DRESSINGS) IMPLANT
ELECT CAUTERY BLADE 6.4 (BLADE) ×2 IMPLANT
ELECT REM PT RETURN 9FT ADLT (ELECTROSURGICAL) ×1
ELECTRODE REM PT RTRN 9FT ADLT (ELECTROSURGICAL) ×2 IMPLANT
GLOVE BIO SURGEON STRL SZ7 (GLOVE) ×4 IMPLANT
GLOVE SURG SYN 6.5 ES PF (GLOVE) ×1 IMPLANT
GLOVE SURG SYN 6.5 PF PI (GLOVE) IMPLANT
GLOVE SURG SYN 8.0 (GLOVE) ×1 IMPLANT
GLOVE SURG SYN 8.0 PF PI (GLOVE) ×2 IMPLANT
GLOVE SURG UNDER LTX SZ7.5 (GLOVE) ×2 IMPLANT
GOWN STRL REUS W/ TWL LRG LVL3 (GOWN DISPOSABLE) ×4 IMPLANT
GOWN STRL REUS W/ TWL XL LVL3 (GOWN DISPOSABLE) ×2 IMPLANT
GOWN STRL REUS W/TWL LRG LVL3 (GOWN DISPOSABLE) ×2
GOWN STRL REUS W/TWL XL LVL3 (GOWN DISPOSABLE) ×1
HANDLE YANKAUER SUCT BULB TIP (MISCELLANEOUS) ×2 IMPLANT
KIT TURNOVER KIT A (KITS) ×2 IMPLANT
LABEL OR SOLS (LABEL) ×2 IMPLANT
MANIFOLD NEPTUNE II (INSTRUMENTS) ×2 IMPLANT
MAT ABSORB  FLUID 56X50 GRAY (MISCELLANEOUS) ×1
MAT ABSORB FLUID 56X50 GRAY (MISCELLANEOUS) ×2 IMPLANT
NS IRRIG 500ML POUR BTL (IV SOLUTION) ×2 IMPLANT
PACK EXTREMITY ARMC (MISCELLANEOUS) ×2 IMPLANT
PAD ABD DERMACEA PRESS 5X9 (GAUZE/BANDAGES/DRESSINGS) ×2 IMPLANT
PAD PREP 24X41 OB/GYN DISP (PERSONAL CARE ITEMS) ×2 IMPLANT
PULSAVAC PLUS IRRIG FAN TIP (DISPOSABLE) ×1
Pulsavac plus wound debridement system IMPLANT
SOL PREP PVP 2OZ (MISCELLANEOUS) ×1
SOLUTION PREP PVP 2OZ (MISCELLANEOUS) IMPLANT
SPONGE T-LAP 18X18 ~~LOC~~+RFID (SPONGE) ×4 IMPLANT
STAPLER SKIN PROX 35W (STAPLE) IMPLANT
STOCKINETTE M/LG 89821 (MISCELLANEOUS) ×2 IMPLANT
SUT ETHILON 2 0 FSLX (SUTURE) IMPLANT
SUT MNCRL 4-0 (SUTURE) ×1
SUT MNCRL 4-0 27XMFL (SUTURE) ×1
SUT SILK 2 0 (SUTURE) ×1
SUT SILK 2-0 18XBRD TIE 12 (SUTURE) ×2 IMPLANT
SUT SILK 3 0 (SUTURE) ×1
SUT SILK 3-0 18XBRD TIE 12 (SUTURE) ×2 IMPLANT
SUT VIC AB 0 CT1 36 (SUTURE) ×8 IMPLANT
SUT VIC AB 3-0 SH 27 (SUTURE) ×2
SUT VIC AB 3-0 SH 27X BRD (SUTURE) ×4 IMPLANT
SUT VICRYL PLUS ABS 0 54 (SUTURE) ×2 IMPLANT
SUTURE MNCRL 4-0 27XMF (SUTURE) ×2 IMPLANT
SYR 20ML LL LF (SYRINGE) ×4 IMPLANT
TAPE UMBILICAL 1/8 X36 TWILL (MISCELLANEOUS) ×2 IMPLANT
TIP FAN IRRIG PULSAVAC PLUS (DISPOSABLE) IMPLANT
TOWEL OR 17X26 4PK STRL BLUE (TOWEL DISPOSABLE) ×4 IMPLANT
TRAP FLUID SMOKE EVACUATOR (MISCELLANEOUS) ×2 IMPLANT
WATER STERILE IRR 500ML POUR (IV SOLUTION) ×2 IMPLANT

## 2022-11-18 NOTE — Interval H&P Note (Signed)
History and Physical Interval Note:  11/18/2022 2:55 PM  Shane Chambers  has presented today for surgery, with the diagnosis of BKA wound.  The various methods of treatment have been discussed with the patient and family. After consideration of risks, benefits and other options for treatment, the patient has consented to  Procedure(s): [place] (Right) as a surgical intervention.  The patient's history has been reviewed, patient examined, no change in status, stable for surgery.  I have reviewed the patient's chart and labs.  Questions were answered to the patient's satisfaction.     Hortencia Pilar

## 2022-11-18 NOTE — Transfer of Care (Signed)
Immediate Anesthesia Transfer of Care Note  Patient: Shane Chambers  Procedure(s) Performed: AMPUTATION BELOW KNEE REVISION AND CLOSURE (Right: Knee)  Patient Location: PACU  Anesthesia Type:General  Level of Consciousness: drowsy  Airway & Oxygen Therapy: Patient Spontanous Breathing and Patient connected to nasal cannula oxygen  Post-op Assessment: Report given to RN and Post -op Vital signs reviewed and stable  Post vital signs: stable  Last Vitals:  Vitals Value Taken Time  BP 115/62 11/18/22 1624  Temp    Pulse 61 11/18/22 1628  Resp    SpO2 96 % 11/18/22 1628  Vitals shown include unvalidated device data.  Last Pain:  Vitals:   11/18/22 1438  TempSrc: Oral  PainSc: 0-No pain      Patients Stated Pain Goal: 0 (54/86/28 2417)  Complications: No notable events documented.

## 2022-11-18 NOTE — Anesthesia Preprocedure Evaluation (Addendum)
Anesthesia Evaluation  Patient identified by MRN, date of birth, ID band Patient awake    Reviewed: Allergy & Precautions, NPO status , Patient's Chart, lab work & pertinent test results  History of Anesthesia Complications Negative for: history of anesthetic complications  Airway Mallampati: IV   Neck ROM: Full    Dental  (+) Missing, Chipped, Poor Dentition   Pulmonary sleep apnea , COPD, former smoker (quit 1996)   Pulmonary exam normal breath sounds clear to auscultation       Cardiovascular hypertension, + CAD (s/p MI and CABG), + Peripheral Vascular Disease and +CHF (ICM, EF 55-60%)  Normal cardiovascular exam+ Valvular Problems/Murmurs (mild AS)  Rhythm:Regular Rate:Normal  ECG 11/10/22:  Normal sinus rhythm Possible Left atrial enlargement Nonspecific ST abnormality Abnormal ECG When compared with ECG of 15-Oct-2022 06:58, Nonspecific T wave abnormality, improved in Lateral leads  ECG 10/20/22: NSR, 76 bpm, nonspecific T wave abnormality, with prolonged QT with QTc interval at 497 ms, otherwise nothing acute.   Echo on 10/13/2022:  1. Left ventricular ejection fraction, by estimation, is 55 to 60%. The left ventricle has normal function. The left ventricle has no regional wall motion abnormalities. There is moderate concentric left ventricular hypertrophy. Left ventricular diastolic parameters are indeterminate.   2. Right ventricular systolic function is normal. The right ventricular size is normal.   3. The mitral valve is normal in structure. Mild mitral valve regurgitation.   4. AV is thickened, calcified. Difficult to see well Peak and mean gradients through the valve are 21 and 12 mm Hg respectively. AVA (VTI) is 1.8 cm2. Dimensionless index is 0.5 consistent with mild AS. The aortic valve was not well visualized. Aortic valve regurgitation is not visualized.   TEE on 03/13/20: - Left Ventricle: The left ventricle has  moderately reduced systolic  function, although is improving with time off of CPB. Overall ejection fraction of approximately 40%. The cavity size was mildly dilated. There is some recovery and improvement in contractility of the infero-septal and inferioir walls.  - Aortic Valve: The aortic valve appears unchanged from pre-bypass.  - Mitral Valve: The mitral valve appears unchanged from pre-bypass. Mild regurgitation persists.    Right and left heart cath on 03/07/20:  Prox RCA lesion is 99% stenosed.  Prox RCA to Mid RCA lesion is 40% stenosed.  Dist RCA lesion is 30% stenosed.  Prox LAD to Mid LAD lesion is 80% stenosed.  1st Diag lesion is 60% stenosed.  Mid LM to Dist LM lesion is 40% stenosed.  Prox Cx to Mid Cx lesion is 95% stenosed.   Neuro/Psych  PSYCHIATRIC DISORDERS  Depression    negative neurological ROS     GI/Hepatic ,GERD  ,,Rectal CA   Endo/Other  diabetes, Poorly Controlled, Type 2, Insulin DependentHypothyroidism  Obesity   Renal/GU Renal disease (nephrolithiasis; stage III CKD)     Musculoskeletal  (+) Arthritis ,    Abdominal   Peds  Hematology  (+) Blood dyscrasia, anemia   Anesthesia Other Findings Cardiology note 10/20/22:  1. Torsades des points, Qtc prolongation Most recent hospital course complicated by QT prolongation, episode of Torsades, pt unresponsive but did not lose pulse. Asymptomatic surrounding event. Pt denies any recent palpitations or tachycardia. EKG today reveals prolonged QT, however improved from hospitalization. Medication list reviewed. Not on any current QT prolonging drugs from a cardiac standpoint. Continue f/u with Psychiatry and PCP as mentioned below. Referred to EP at d/c and has upcoming appt with Dr. Caryl Comes . Will  obtain CMP and Mag level. Continue current medication regimen. Heart healthy diet recommended and continue therapy at SNF.    2. CAD, s/p hx of MI and s/p CABG in 2021 Stable with no anginal symptoms. No  indication for ischemic evaluation. Continue ASA, Lipitor, and Lopressor. Heart healthy diet recommended and continue therapy at SNF. Will obtain FLP as mentioned below.    3. Chronic diastolic CHF Most recent TTE revealed EF 55 to 60%, EF reduced in 2021, related to ischemic cardiomyopathy around CABG that improved to 60 to 65%. Does admit to chronic, intermittent SHOB. Will obtain pro-BNP. Appears overall euvolemic on exam. Low sodium diet, fluid restriction <2L, and daily weights encouraged. Educated to contact our office for weight gain of 2 lbs overnight or 5 lbs in one week.  Continue torsemide and current medication regimen.  Will obtain CMP and mag level as mentioned above.    4. Mild aortic stenosis Recent TTE revealed mild aortic stenosis without evidence of aortic regurgitation.  Patient is asymptomatic.  Continue current medication regimen.  Continue serial Echocardiograms, recommend to be repeated in 1 year or sooner if clinically indicated. Heart healthy diet recommended and continue therapy at SNF.   5. HLD No recent lipid panel on file. Will obtain FLP and CMP as mentioned above. Continue lipitor. Heart healthy diet recommended and continue therapy at SNF.    6. COPD Recent hospital course complicated by possible aspiration pneumonia during episode of torsades where he was unresponsive.  Clear/diminished breath sounds heard during auscultation.  Completed course of antibiotics.  Continue follow-up with PCP.    7. BKA Recent BKA of right lower extremity and has completed wound VAC. Denies any problems. Receiving therapy at SNF. Continue to follow with Orthopedics, PCP, and Psychiatry as mentioned above. Will obtain CBC in addition to lab work as mentioned above.    8. AKI on CKD stage 3 Peak sCr at 3.06 during hospitalization. Improvement in kidney function during hospitalization d/t IV fluids with most recent sCr at 1.17 with eGFR > 60. Will obtain repeat CMET as mentioned above.     9. Disposition: Will obtain the previously mentioned lab work at [facility] and will have lab results faxed over to [place].  Follow-up with Dr. Caryl Comes on 11/04/22 and follow-up with Dr. Carlyle Dolly in 3 months.    Reproductive/Obstetrics                             Anesthesia Physical Anesthesia Plan  ASA: 4  Anesthesia Plan: General   Post-op Pain Management:    Induction: Intravenous  PONV Risk Score and Plan: 2 and Ondansetron, Dexamethasone and Treatment may vary due to age or medical condition  Airway Management Planned: LMA  Additional Equipment:   Intra-op Plan:   Post-operative Plan: Extubation in OR  Informed Consent: I have reviewed the patients History and Physical, chart, labs and discussed the procedure including the risks, benefits and alternatives for the proposed anesthesia with the patient or authorized representative who has indicated his/her understanding and acceptance.     Dental advisory given  Plan Discussed with: CRNA  Anesthesia Plan Comments: (Patient consented for risks of anesthesia including but not limited to:  - adverse reactions to medications - damage to eyes, teeth, lips or other oral mucosa - nerve damage due to positioning  - sore throat or hoarseness - damage to heart, brain, nerves, lungs, other parts of body or loss of life  Informed patient about role of CRNA in peri- and intra-operative care.  Patient voiced understanding.)        Anesthesia Quick Evaluation

## 2022-11-18 NOTE — Progress Notes (Signed)
[facility] Progress Note  Patient: Shane Chambers    VXB:[id number]  DOA: 11/11/2022     Date of Service: the patient was seen and examined on 11/18/2022  No chief complaint on file.  Brief hospital course:  Shane Chambers is a 62 y.o. male with past medical history of CAD s/p CABG, HFrEF with recovered EF, PVD, type 2 diabetes, COPD, hypertension, hyperlipidemia, hypothyroidism, depression who is current he admitted for suicidal ideation and found to have complication of his amputation site.  Shane Chambers states that for the last 3 days, he has been experiencing increasing pain of his right BKA stump in addition to increasing erythema.  His nurse at bedside states that he has been picking at it. Patient has not noticed any purulent drainage at this time but there are areas where he is worried that the incision is opening. Per chart review, patient was admitted on 11/23 for cellulitis of the right lower extremity extending from the foot up towards the knee.  CT imaging at that time demonstrated holdable complex fluid collections and possible medial malleolus osteomyelitis.  MRI was subsequently obtained that demonstrated right ankle septic arthritis.  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  He followed up with orthopedic surgery on 12/14.  At that time, he was instructed to follow-up in 1 week for staple removal.   Assessment and Plan:  Right BKA stump abscess, s/p revision and washout done on 11/14/2022 H/o Acute osteomyelitis, s/p right BKA Initially patient was seen in consultation at [facility], however required transfer to the medical unit due to inability to administer IV antibiotics.  Patient presented with 3 days of gradually worsening right stump erythema and pain.  Initial concern for cellulitis or only, however vascular surgery was able to put a probe down to the bone with 200 cc of purulent drainage, more consistent with osteomyelitis. Inflammatory markers are elevated. Vascular  surgery consulted, s/p revision of right BKA stump, washout of Abscess in the posterior portion of the below-knee amputation stump, Deep wound culture to microbiology  Abx vancomycin, ceftriaxone, and metronidazole  1/8 wound culture growing MRSA, discontinued ceftriaxone.  Wound culture for 5 days Tylenol as needed for fever Started gabapentin, patient was taking 6000 mg p.o. twice daily changed oxycodone 5 mg p.o. every 4 hourly as needed WBC count trending down Wound care consulted 1/8 ID consulted for recommendation of antibiotics and duration of treatment  1/9 patient is scheduled for second washout and wound VAC change by vascular surgery today  Hypomagnesemia, mag repleted.  Resolved Monitor and replete as needed.  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  EKG today with improved QTc.    Hypertension, continue amlodipine, Lopressor HLD, continue statin  Depression - Continue SSRIs and antipsychotics started on recent [facility] admission  Continue Abilify and Effexor  Type 2 diabetes mellitus (HCC) - SSI, moderate - Hold home metformin  Hypothyroid, continued Synthroid Constipation, continue started MiraLAX and Dulcolax nightly.  Discussed the dose if no constipation BPH, continue Flomax Vitamin D insufficiency: started vitamin D 50,000 units p.o. weekly, follow with PCP to repeat vitamin D level after 3 to 6 months.  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  Recommended to follow with neurology for EMG  and NCS test as an outpatient  There is no height or weight on file to calculate BMI.  Interventions:    Pressure Injury 11/06/22 Buttocks Right Stage 2 -  Partial thickness loss of dermis presenting as a shallow open  injury with a red, pink wound bed without slough. no odor. no drainage. no slough. 1cm X 0.3cm (Active)  11/06/22 1800  Location: Buttocks  Location Orientation: Right  Staging: Stage 2 -  Partial thickness loss of dermis presenting as a shallow open injury with a red, pink wound bed without slough.  Wound Description (Comments): no odor. no drainage. no slough. 1cm X 0.3cm  Present on Admission:   Dressing Type None 11/18/22 0820     Diet: Heart healthy/carb modified DVT Prophylaxis: Subcutaneous Lovenox   Advance goals of care discussion: Full code  Family Communication: family was NOT present at bedside, at the time of interview.  The pt provided permission to discuss medical plan with the family. Opportunity was given to ask question and all questions were answered satisfactorily.   Disposition:  Pt is from Home, admitted with right BKA infection and depression with SI/HI ideations under BHU, still has infection, on IV antibiotics, s/p right BKA stump washout done on 1/5, second washout on 1/9 , wound culture growing MRSA, ID consulted for further recommendation, which precludes a safe discharge. Discharge to SNF, when cleared by vascular surgery and ID  Subjective: No significant events overnight, complaining of pain about 6/10 well-controlled with meds.  Patient was resting comfortably, denied any active issues.     Physical Exam: General:  alert oriented to time, place, and person.  Appear in no distress, affect appropriate Eyes: PERRLA ENT: Oral Mucosa Clear, moist  Neck: no JVD,  Cardiovascular: S1 and S2 Present, no Murmur,  Respiratory: good respiratory effort, Bilateral Air entry equal and Decreased, no Crackles, no wheezes Abdomen: Bowel Sound present, Soft and no tenderness,  Skin: no rashes Extremities: no Pedal edema, no calf tenderness, s/p Right BKA stump washout, wound VAC attached. Neurologic: without any new focal findings Gait not checked due to patient  safety concerns  Vitals:   11/17/22 1955 11/18/22 0333 11/18/22 0500 11/18/22 1150  BP:  (!) 144/62  136/72  Pulse:  62  61  Resp:  18  18  Temp:  98.3 F (36.8 C)  97.7 F (36.5 C)  TempSrc:    Oral  SpO2:  96%  97%  Weight: 111.4 kg  111.7 kg   Height: '5\' 9"'$  (1.753 m)       Intake/Output Summary (Last 24 hours) at 11/18/2022 1434 Last data filed at 11/18/2022 1215 Gross per 24 hour  Intake 240 ml  Output 2075 ml  Net -1835 ml   Filed Weights   11/16/22 0346 11/17/22 1955 11/18/22 0500  Weight: 111.4 kg 111.4 kg 111.7 kg    Data Reviewed: I have personally reviewed and interpreted daily labs, tele strips, imagings as discussed above. I reviewed all nursing notes, pharmacy notes, vitals, pertinent old records I have discussed plan of care as described above with RN and patient/family.  CBC: Recent Labs  Lab 11/13/22 0857 11/14/22 0552 11/15/22 0454 11/16/22 0447 11/18/22 0418  WBC 9.9 9.5 8.3 9.6 12.6*  HGB 8.2* 8.3* 8.0* 7.9* 8.5*  HCT 25.2* 26.4* 24.8* 24.5* 26.6*  MCV 90.0 90.7 89.5 90.7 91.4  PLT 288 293 333 323 102   Basic Metabolic Panel: Recent Labs  Lab 11/12/22 0610 11/13/22 0857 11/14/22 0552 11/15/22 0454  11/16/22 0447 11/17/22 0238 11/18/22 0418  NA 135 133* 133* 135 137  --  136  K 4.1 4.0 4.2 4.3 4.1  --  4.1  CL 101 99 100 102 102  --  101  CO2 '24 26 25 22 25  '$ --  26  GLUCOSE 87 92 109* 157* 132*  --  158*  BUN 35* 49* 63* 61* 63*  --  64*  CREATININE 1.15 1.34* 1.30* 1.26* 1.18 1.25* 1.11  CALCIUM 8.7* 8.3* 8.7* 9.0 8.8*  --  8.9  MG 1.7 1.6* 1.7  --   --   --  1.8  PHOS 4.3 5.2* 4.6  --   --   --  3.3    Studies: No results found.  Scheduled Meds:  [MAR Hold] amLODipine  10 mg Oral Daily   [MAR Hold] ARIPiprazole  10 mg Oral Daily   [MAR Hold] vitamin C  500 mg Oral BID   [MAR Hold] aspirin EC  81 mg Oral Q breakfast   [MAR Hold] atorvastatin  80 mg Oral QHS   [MAR Hold] bisacodyl  10 mg Oral QHS   [MAR Hold] clotrimazole    Topical BID   [MAR Hold] enoxaparin (LOVENOX) injection  0.5 mg/kg Subcutaneous Q24H   [MAR Hold] ferrous gluconate  324 mg Oral Daily   [MAR Hold] fluticasone furoate-vilanterol  1 puff Inhalation Daily   [MAR Hold] gabapentin  600 mg Oral BID   [MAR Hold] insulin aspart  0-15 Units Subcutaneous TID AC & HS   [MAR Hold] levothyroxine  75 mcg Oral Q0600   [MAR Hold] magnesium oxide  400 mg Oral Daily   [MAR Hold] metoprolol tartrate  50 mg Oral BID   [MAR Hold] mineral oil  1 enema Rectal Once   [MAR Hold] multivitamin with minerals  1 tablet Oral Daily   [MAR Hold] pantoprazole  40 mg Oral Daily   [MAR Hold] polyethylene glycol  17 g Oral BID   [MAR Hold] polyvinyl alcohol  1 drop Both Eyes Daily   [MAR Hold] potassium chloride SA  20 mEq Oral Daily   [MAR Hold] Ensure Max Protein  11 oz Oral TID   [MAR Hold] sodium chloride flush  3 mL Intravenous Q12H   [MAR Hold] tamsulosin  0.8 mg Oral Daily   [MAR Hold] thiamine  100 mg Oral Daily   [MAR Hold] torsemide  40 mg Oral Daily   [MAR Hold] venlafaxine XR  150 mg Oral Q breakfast   [MAR Hold] Vitamin D (Ergocalciferol)  50,000 Units Oral Q7 days   [MAR Hold] zinc sulfate  220 mg Oral Daily   Continuous Infusions:  [MAR Hold] vancomycin 1,750 mg (11/18/22 1214)   PRN Meds: [MAR Hold] acetaminophen, [MAR Hold] albuterol, [MAR Hold] fentaNYL (SUBLIMAZE) injection, [MAR Hold] hydrALAZINE, [MAR Hold]  HYDROmorphone (DILAUDID) injection, [MAR Hold] ipratropium-albuterol, [MAR Hold] loperamide HCl, [MAR Hold] ondansetron (ZOFRAN) IV, [MAR Hold] mouth rinse, [MAR Hold] oxyCODONE  Time spent: 35 minutes  Author: Val Riles. MD [facility] 11/18/2022 2:34 PM  To reach On-call, see care teams to locate the attending and reach out to them via [email or web address]. If 7PM-7AM, please contact night-coverage If you still have difficulty reaching the attending provider, please page the [facility] (Director on Call) for [facility] on amion for  assistance.

## 2022-11-18 NOTE — Progress Notes (Signed)
Physical Therapy Treatment Patient Details Name: Shane Chambers MRN: [id number] DOB: Mar 19, 1961 Today's Date: 11/18/2022   History of Present Illness Pt is a 62 year old male admitted to the medical unit, transfer from gero psych (admitted for SI) found to have complication of his amputation site, followed by vascular. Pt is s/p excisional debridement & drainage of abscess & application of wound vac on RLE residual limb on 11/14/22. PMH significant for CAD s/p CABG, HFrEF with recovered EF, PVD, type 2 diabetes, COPD, hypertension, hyperlipidemia, hypothyroidism, depression    PT Comments    Pt ready for session.  Wash out and dressing change planned for this afternoon so pt willing to get up this am.  He does ask to be able to see out of the window so chair is moved to the other side of the bed and he transfers to drop arm recliner to right.  He does struggle a bit more in this direction vs left and does need min/mod a x 1 to complete transition.  Some bloody drainage is noted from wound vac dressing which he stated started last night.  Ex deferred as to not make leakage worse.    While talking to pt about muscle wasting in hands and movement patterns.  He stated he did fall in 2000 and hit his head and upon ED visit he said that they told him he had "loose bones" in his neck but did not feel it was fall related.  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  Stated he didn't have any weakness at the time but does stated he as noticed some progressive weakness since that time.  It is evident by his movement patterns and posturing.  Also visible muscle atrophy noted B hands.  He stated he has not talked to any recent MD about this or has any neuro/neurosurgery follow ups since incident..  Will mention to primary MD.    Recommendations for follow up therapy are one component of a multi-disciplinary discharge planning  process, led by the attending physician.  Recommendations may be updated based on patient status, additional functional criteria and insurance authorization.  Follow Up Recommendations  Skilled nursing-short term rehab (<3 hours/day)     Assistance Recommended at Discharge Frequent or constant Supervision/Assistance  Patient can return home with the following Direct supervision/assist for medications management;Assist for transportation;Help with stairs or ramp for entrance;A little help with walking and/or transfers;A little help with bathing/dressing/bathroom   Equipment Recommendations       Recommendations for Other Services       Precautions / Restrictions Precautions Precautions: Fall Restrictions Weight Bearing Restrictions: Yes [name] Weight Bearing: Weight bearing as tolerated LUE Weight Bearing: Weight bearing as tolerated Other Position/Activity Restrictions: no activity/WB orders in chart, wound along R BKA incision, treated as NWBing today     Mobility  Bed Mobility Overal bed mobility: Needs Assistance Bed Mobility: Supine to Sit Rolling: Supervision   Supine to sit: Supervision, HOB elevated          Transfers Overall transfer level: Needs assistance Equipment used: None Transfers: Bed to chair/wheelchair/BSC            Lateral/Scoot Transfers: Min assist, Mod assist General transfer comment: does need some increased help going to right today to recliner, supervision on left prior.    Ambulation/Gait  Stairs             Wheelchair Mobility    Modified Rankin (Stroke Patients Only)       Balance Overall balance assessment: Needs assistance Sitting-balance support: Single extremity supported, Feet supported Sitting balance-Leahy Scale: Good                                      Cognition Arousal/Alertness: Awake/alert Behavior During Therapy: WFL for tasks assessed/performed Overall  Cognitive Status: Within Functional Limits for tasks assessed                                          Exercises      General Comments        Pertinent Vitals/Pain Pain Assessment Pain Assessment: Faces Pain Score: 5  Pain Location: R residual limb Pain Descriptors / Indicators: Aching, Grimacing Pain Intervention(s): Limited activity within patient's tolerance, Monitored during session, Repositioned    Home Living                          Prior Function            PT Goals (current goals can now be found in the care plan section) Progress towards PT goals: Progressing toward goals    Frequency    7X/week      PT Plan Current plan remains appropriate    Co-evaluation              AM-PAC PT "6 Clicks" Mobility   Outcome Measure  Help needed turning from your back to your side while in a flat bed without using bedrails?: None Help needed moving from lying on your back to sitting on the side of a flat bed without using bedrails?: A Little Help needed moving to and from a bed to a chair (including a wheelchair)?: A Little Help needed standing up from a chair using your arms (e.g., wheelchair or bedside chair)?: Total Help needed to walk in hospital room?: Total Help needed climbing 3-5 steps with a railing? : Total 6 Click Score: 13    End of Session Equipment Utilized During Treatment: Gait belt Activity Tolerance: Patient tolerated treatment well Patient left: in chair;with chair alarm set;with call bell/phone within reach Nurse Communication: Mobility status PT Visit Diagnosis: Muscle weakness (generalized) (M62.81);Difficulty in walking, not elsewhere classified (R26.2);Other abnormalities of gait and mobility (R26.89)     Time: 6203-5597 PT Time Calculation (min) (ACUTE ONLY): 24 min  Charges:  $Therapeutic Activity: 23-37 mins                   Chesley Noon, PTA 11/18/22, 9:50 AM

## 2022-11-18 NOTE — Progress Notes (Incomplete)
Date of Admission:  11/11/2022   Total days of antibiotics ***        Day ***        Day ***        Day ***   ID: Shane Chambers is a 62 y.o. male with  *** Principal Problem:   Acute osteomyelitis (HCC) Active Problems:   Type 2 diabetes mellitus (HCC)   Depression   QT prolongation    Subjective: ***  Medications:   [MAR Hold] amLODipine  10 mg Oral Daily   [MAR Hold] ARIPiprazole  10 mg Oral Daily   [MAR Hold] vitamin C  500 mg Oral BID   [MAR Hold] aspirin EC  81 mg Oral Q breakfast   [MAR Hold] atorvastatin  80 mg Oral QHS   [MAR Hold] bisacodyl  10 mg Oral QHS   [MAR Hold] clotrimazole   Topical BID   [MAR Hold] enoxaparin (LOVENOX) injection  0.5 mg/kg Subcutaneous Q24H   [MAR Hold] ferrous gluconate  324 mg Oral Daily   [MAR Hold] fluticasone furoate-vilanterol  1 puff Inhalation Daily   [MAR Hold] gabapentin  600 mg Oral BID   [MAR Hold] insulin aspart  0-15 Units Subcutaneous TID AC & HS   [MAR Hold] levothyroxine  75 mcg Oral Q0600   [MAR Hold] magnesium oxide  400 mg Oral Daily   [MAR Hold] metoprolol tartrate  50 mg Oral BID   [MAR Hold] mineral oil  1 enema Rectal Once   [MAR Hold] multivitamin with minerals  1 tablet Oral Daily   [MAR Hold] pantoprazole  40 mg Oral Daily   [MAR Hold] polyethylene glycol  17 g Oral BID   [MAR Hold] polyvinyl alcohol  1 drop Both Eyes Daily   [MAR Hold] potassium chloride SA  20 mEq Oral Daily   [MAR Hold] Ensure Max Protein  11 oz Oral TID   [MAR Hold] sodium chloride flush  3 mL Intravenous Q12H   [MAR Hold] tamsulosin  0.8 mg Oral Daily   [MAR Hold] thiamine  100 mg Oral Daily   [MAR Hold] torsemide  40 mg Oral Daily   [MAR Hold] venlafaxine XR  150 mg Oral Q breakfast   [MAR Hold] Vitamin D (Ergocalciferol)  50,000 Units Oral Q7 days   [MAR Hold] zinc sulfate  220 mg Oral Daily    Objective: Vital signs in last 24 hours: Temp:  [97.7 F (36.5 C)-98.6 F (37 C)] 97.7 F (36.5 C) (01/09 1438) Pulse Rate:   [58-63] 63 (01/09 1438) Resp:  [14-20] 14 (01/09 1438) BP: (113-144)/(55-72) 113/70 (01/09 1438) SpO2:  [96 %-99 %] 97 % (01/09 1438) Weight:  [111.4 kg-111.7 kg] 111.7 kg (01/09 1438)  LDA Foley Central lines Other catheters  PHYSICAL EXAM:  General: Alert, cooperative, no distress, appears stated age.  Head: Normocephalic, without obvious abnormality, atraumatic. Eyes: Conjunctivae clear, anicteric sclerae. Pupils are equal ENT Nares normal. No drainage or sinus tenderness. Lips, mucosa, and tongue normal. No Thrush Neck: Supple, symmetrical, no adenopathy, thyroid: non tender no carotid bruit and no JVD. Back: No CVA tenderness. Lungs: Clear to auscultation bilaterally. No Wheezing or Rhonchi. No rales. Heart: Regular rate and rhythm, no murmur, rub or gallop. Abdomen: Soft, non-tender,not distended. Bowel sounds normal. No masses Extremities: atraumatic, no cyanosis. No edema. No clubbing Skin: No rashes or lesions. Or bruising Lymph: Cervical, supraclavicular normal. Neurologic: Grossly non-focal  Lab Results Recent Labs    11/16/22 0447 11/17/22 0238 11/18/22 0418  WBC 9.6  --  12.6*  HGB 7.9*  --  8.5*  HCT 24.5*  --  26.6*  NA 137  --  136  K 4.1  --  4.1  CL 102  --  101  CO2 25  --  26  BUN 63*  --  64*  CREATININE 1.18 1.25* 1.11   Liver Panel No results for input(s): "PROT", "ALBUMIN", "AST", "ALT", "ALKPHOS", "BILITOT", "BILIDIR", "IBILI" in the last 72 hours. Sedimentation Rate No results for input(s): "ESRSEDRATE" in the last 72 hours. C-Reactive Protein No results for input(s): "CRP" in the last 72 hours.  Microbiology:  Studies/Results: No results found.   Assessment/Plan: ***

## 2022-11-18 NOTE — Op Note (Signed)
    OPERATIVE NOTE   PROCEDURE: 1.  Excisional debridement right below-knee amputation stump  PRE-OPERATIVE DIAGNOSIS: Abscess right below-knee amputation stump  POST-OPERATIVE DIAGNOSIS: Same  SURGEON: Belenda Cruise Char Feltman  ASSISTANT(S): Annalee Genta, NP  ANESTHESIA: general  ESTIMATED BLOOD LOSS: 10 cc  FINDING(S): The wound continues to appear to be richly granulated however the flap did not adhere to the proximal muscle bellies.  Interestingly and of great benefit granulation tissue has now completely covered the tibia.  There is no longer any bone directly exposed.  SPECIMEN(S): None  INDICATIONS:   Shane Chambers is a 62 y.o. male who presents with an abscess of his right below-knee amputation stump.  Approximately 4 days ago with he underwent the initial incision and drainage with debridement.  He is being returned to the operating room today for removal of the VAC dressing and any further debridement that is required.  Risk and benefits of been reviewed all questions been answered patient agrees to proceed  DESCRIPTION: After full informed written consent was obtained from the patient, the patient was brought back to the operating room and placed supine upon the operating table.  Prior to induction, the patient received IV antibiotics.   After obtaining adequate anesthesia, the patient was then prepped and draped in the standard fashion for a debridement of his right below-knee amputation stump.  The nylon sutures are removed and flap immediately falls open.  There are patchy areas of necrotic tissue however I do not see any frank purulence.  In the medial side the cavity is quite a bit smaller and as noted above in the findings section healthy appearing granulation is now completely covered the tibia.  On the lateral aspect there appears to be a retained space which was somewhat slimy.  A curette was opened onto the field and the entire surface of the wound was treated with a  curette.  Excisional debridement was carried out with both curette and Metzenbaum scissors as needed.  Patchy areas were addressed as well.  Once the entire surface of both the flap and the proximal cut calf had been addressed the wound was irrigated with 3 L of normal saline using a Simpulse irrigator.  The wound was then measured and now measures 15 cm in length by 5 cm in width.  This represents 70 cm.  A 2-0 nylon was then used to reapproximate the posterior flap to the anterior skin edge using 2 interrupted mattress sutures allowing for both the medial and lateral aspects to remain open.  A normal saline wet-to-dry dressing was then performed using fluffed gauze and packed both medially and laterally.  The wound was then padded with ABDs wrapped with 3 Curlex and an Ace wrap.    The patient tolerated this procedure well.   COMPLICATIONS: None  CONDITION: [facility]  Office: [phone]   11/18/2022, 5:17 PM

## 2022-11-18 NOTE — Progress Notes (Signed)
Pharmacy Antibiotic Note  Shane Chambers is a 62 y.o. male w/ PMH of BPH, DM, CAD, CABG, CHF, HTN, hypothyroidism, depression, BKA admitted on 11/11/2022 with  right below-knee amputation infection .  Pharmacy has been consulted for vancomycin dosing.  Levels were drawn at steady-state on 750 mg IV vancomycin every 12 hours resulting in the following pharmacokinetic parameters:   Ke: 0.0195 h-1 T1/2: 35.5 h  A subsequent 500 mg IV vancomycin dose was administered following random vancomycin levels resulting in a calculated peak of 26 mcg/mL. Renal function is fairly stable  ID is now following patient  Plan: Random level 1/9 @ 0418 = 15 ug/ml --Continue 1750 mg IV every 48 hours (adjusted time of first dose from 1600 to 1200) --Random level ordered for 1/10 @ 1200 --Follow levels and renal function for adjustments  Height: '5\' 9"'$  (175.3 cm) Weight: 111.7 kg (246 lb 4.1 oz) IBW/kg (Calculated) : 70.7 Temp (24hrs), Avg:98.3 F (36.8 C), Min:98 F (36.7 C), Max:98.6 F (37 C)  Recent Labs  Lab 11/13/22 0857 11/14/22 0552 11/15/22 0454 11/16/22 0029 11/16/22 0447 11/16/22 0950 11/16/22 2152 11/17/22 0238 11/18/22 0418  WBC 9.9 9.5 8.3  --  9.6  --   --   --  12.6*  CREATININE 1.34* 1.30* 1.26*  --  1.18  --   --  1.25* 1.11  VANCOTROUGH  --   --   --   --   --  30*  --   --   --   VANCOPEAK  --   --   --  36  --   --   --   --   --   VANCORANDOM  --   --   --   --   --   --    < > 19 15   < > = values in this interval not displayed.     Estimated Creatinine Clearance: 88.3 mL/min (by C-G formula based on SCr of 1.11 mg/dL).    Allergies  Allergen Reactions   Ace Inhibitors Swelling and Cough    (Not on MAR at [facility])   Haloperidol And Related     Do NOT give anti-psychotics due to risk of  Torsades and QT prolongation (per Dr. Graciela Husbands request)   Other Itching    [name]   Shellfish Allergy    Penicillins Itching and Rash     Has patient had a PCN reaction causing immediate rash, facial/tongue/throat swelling, SOB or lightheadedness with hypotension: Unknown Has patient had a PCN reaction causing severe rash involving mucus membranes or skin necrosis: Unknown Has patient had a PCN reaction that required hospitalization: Unknown Has patient had a PCN reaction occurring within the last 10 years: Unknown If all of the above answers are "NO", then may proceed with Cephalosporin use.     Antimicrobials this admission: 1/1 vancomycin >>  1/1 metronidazole >>1/8 1/1 ceftriaxone >>1/8  Microbiology results: 1/5 WCx MRSA (S = Vanco, SMZ/TMP)  Thank you for allowing pharmacy to be a part of this patient's care.  Lorin Picket, PharmD, BCPS 11/18/2022 9:56 AM

## 2022-11-19 ENCOUNTER — Encounter (HOSPITAL_COMMUNITY): Payer: Self-pay | Admitting: Hematology

## 2022-11-19 ENCOUNTER — Encounter: Payer: Self-pay | Admitting: Vascular Surgery

## 2022-11-19 DIAGNOSIS — M8610 Other acute osteomyelitis, unspecified site: Secondary | ICD-10-CM | POA: Diagnosis not present

## 2022-11-19 DIAGNOSIS — T8740 Infection of amputation stump, unspecified extremity: Secondary | ICD-10-CM

## 2022-11-19 DIAGNOSIS — A4902 Methicillin resistant Staphylococcus aureus infection, unspecified site: Secondary | ICD-10-CM

## 2022-11-19 LAB — BASIC METABOLIC PANEL
Anion gap: 9 (ref 5–15)
BUN: 64 mg/dL — ABNORMAL HIGH (ref 6–20)
CO2: 25 mmol/L (ref 22–32)
Calcium: 8.8 mg/dL — ABNORMAL LOW (ref 8.9–10.3)
Chloride: 102 mmol/L (ref 98–111)
Creatinine, Ser: 1.07 mg/dL (ref 0.61–1.24)
GFR, Estimated: >60 mL/min (ref >60)
Glucose, Bld: 158 mg/dL — ABNORMAL HIGH (ref 70–99)
Potassium: 4.3 mmol/L (ref 3.5–5.1)
Sodium: 136 mmol/L (ref 135–145)

## 2022-11-19 LAB — CBC
HCT: 26 % — ABNORMAL LOW (ref 39.0–52.0)
Hemoglobin: 8.2 g/dL — ABNORMAL LOW (ref 13.0–17.0)
MCH: 28.7 pg (ref 26.0–34.0)
MCHC: 31.5 g/dL (ref 30.0–36.0)
MCV: 90.9 fL (ref 80.0–100.0)
Platelets: 350 10*3/uL (ref 150–400)
RBC: 2.86 MIL/uL — ABNORMAL LOW (ref 4.22–5.81)
RDW: 15.4 % (ref 11.5–15.5)
WBC: 12.5 10*3/uL — ABNORMAL HIGH (ref 4.0–10.5)
nRBC: 0 % (ref 0.0–0.2)

## 2022-11-19 LAB — AEROBIC/ANAEROBIC CULTURE W GRAM STAIN (SURGICAL/DEEP WOUND)

## 2022-11-19 LAB — GLUCOSE, CAPILLARY
Glucose-Capillary: 145 mg/dL — ABNORMAL HIGH (ref 70–99)
Glucose-Capillary: 207 mg/dL — ABNORMAL HIGH (ref 70–99)
Glucose-Capillary: 135 mg/dL — ABNORMAL HIGH (ref 70–99)
Glucose-Capillary: 174 mg/dL — ABNORMAL HIGH (ref 70–99)

## 2022-11-19 LAB — MAGNESIUM: Magnesium: 1.9 mg/dL (ref 1.7–2.4)

## 2022-11-19 LAB — PHOSPHORUS: Phosphorus: 4.4 mg/dL (ref 2.5–4.6)

## 2022-11-19 LAB — VANCOMYCIN, RANDOM: Vancomycin Rm: 18 ug/mL

## 2022-11-19 MED ORDER — HYDROMORPHONE HCL 1 MG/ML IJ SOLN
0.5000 mg | Freq: Two times a day (BID) | INTRAMUSCULAR | Status: DC
  Administered 2022-11-19 – 2022-11-24 (×10): 0.5 mg via INTRAVENOUS
  Filled 2022-11-19 (×10): qty 0.5

## 2022-11-19 MED ORDER — HYDROMORPHONE HCL 1 MG/ML IJ SOLN: 0.5000 mg | INTRAMUSCULAR | Status: AC

## 2022-11-19 NOTE — Progress Notes (Signed)
  Progress Note    11/19/2022 5:43 PM 1 Day Post-Op  Subjective: Shane Chambers is a 62 year old male who is now POD # 1 status post wound washout of left BKA stump infection.  Denies any pain.  Denies any drainage from the wound.  Complications to note.  Vitals all remained stable.   Vitals:   11/19/22 0750 11/19/22 1603  BP: 130/65 (!) 133/56  Pulse: (!) 58 60  Resp: 18 20  Temp: (!) 97.4 F (36.3 C) 98.3 F (36.8 C)  SpO2: 97% 93%   Physical Exam: Cardiac:  RRR Lungs:  Lungs clear throughout but diminished in the bases Incisions:  Open to left BKA stump. Two nylon retention sutures in place  Extremities:  Left BKA healing as expected post infection Abdomen:  Positive bowel sounds, soft, NT/ND  Neurologic: AAOX3 Person, Place and time  CBC    Component Value Date/Time   WBC 12.5 (H) 11/19/2022 0402   RBC 2.86 (L) 11/19/2022 0402   HGB 8.2 (L) 11/19/2022 0402   HCT 26.0 (L) 11/19/2022 0402   PLT 350 11/19/2022 0402   MCV 90.9 11/19/2022 0402   MCH 28.7 11/19/2022 0402   MCHC 31.5 11/19/2022 0402   RDW 15.4 11/19/2022 0402   LYMPHSABS 1.5 11/10/2022 1916   MONOABS 1.0 11/10/2022 1916   EOSABS 0.7 (H) 11/10/2022 1916   BASOSABS 0.1 11/10/2022 1916    BMET    Component Value Date/Time   NA 136 11/19/2022 0402   K 4.3 11/19/2022 0402   CL 102 11/19/2022 0402   CO2 25 11/19/2022 0402   GLUCOSE 158 (H) 11/19/2022 0402   BUN 64 (H) 11/19/2022 0402   CREATININE 1.07 11/19/2022 0402   CALCIUM 8.8 (L) 11/19/2022 0402   GFRNONAA >60 11/19/2022 0402   GFRAA >60 08/07/2020 0255    INR    Component Value Date/Time   INR 1.5 (H) 09/25/2022 1801     Intake/Output Summary (Last 24 hours) at 11/19/2022 1743 Last data filed at 11/19/2022 1447 Gross per 24 hour  Intake --  Output 1850 ml  Net -1850 ml     Assessment/Plan:  62 y.o. male is s/p Left BKS Incision and Drainage of stump infection.  1 Day Post-Op  Dressing changed today.  Wet-to-dry's with gauze  packing the wound covered with ABDs and wrapped in Kerlix.  No purulent drainage noted.  2 nylon retention sutures remain in place.  Drainage to note. 2.  Recommend continued antibiotic coverage for MRSA. 3.  Vascular surgery will continue to follow and do daily dressing changes.   Drema [facility] 11/19/2022 5:43 PM

## 2022-11-19 NOTE — Plan of Care (Signed)
  Problem: Education: Goal: Ability to describe self-care measures that may prevent or decrease complications (Diabetes Survival Skills Education) will improve Outcome: Progressing Goal: Individualized Educational Video(s) Outcome: Progressing   Problem: Coping: Goal: Ability to adjust to condition or change in health will improve Outcome: Progressing   Problem: Fluid Volume: Goal: Ability to maintain a balanced intake and output will improve Outcome: Progressing   Problem: Health Behavior/Discharge Planning: Goal: Ability to identify and utilize available resources and services will improve Outcome: Progressing Goal: Ability to manage health-related needs will improve Outcome: Progressing   Problem: Metabolic: Goal: Ability to maintain appropriate glucose levels will improve Outcome: Progressing   Problem: Nutritional: Goal: Maintenance of adequate nutrition will improve Outcome: Progressing Goal: Progress toward achieving an optimal weight will improve Outcome: Progressing   Problem: Skin Integrity: Goal: Risk for impaired skin integrity will decrease Outcome: Progressing   Problem: Tissue Perfusion: Goal: Adequacy of tissue perfusion will improve Outcome: Progressing   Problem: Education: Goal: Knowledge of General Education information will improve Description: Including pain rating scale, medication(s)/side effects and non-pharmacologic comfort measures Outcome: Progressing   Problem: Health Behavior/Discharge Planning: Goal: Ability to manage health-related needs will improve Outcome: Progressing   Problem: Clinical Measurements: Goal: Ability to maintain clinical measurements within normal limits will improve Outcome: Progressing Goal: Will remain free from infection Outcome: Progressing Goal: Diagnostic test results will improve Outcome: Progressing Goal: Respiratory complications will improve Outcome: Progressing Goal: Cardiovascular complication will  be avoided Outcome: Progressing   Problem: Activity: Goal: Risk for activity intolerance will decrease Outcome: Progressing   Problem: Nutrition: Goal: Adequate nutrition will be maintained Outcome: Progressing   Problem: Elimination: Goal: Will not experience complications related to bowel motility Outcome: Progressing Goal: Will not experience complications related to urinary retention Outcome: Progressing   Problem: Pain Managment: Goal: General experience of comfort will improve Outcome: Progressing   Problem: Safety: Goal: Ability to remain free from injury will improve Outcome: Progressing

## 2022-11-19 NOTE — Progress Notes (Signed)
Pharmacy Antibiotic Note  Shane Chambers is a 62 y.o. male w/ PMH of BPH, DM, CAD, CABG, CHF, HTN, hypothyroidism, depression, BKA admitted on 11/11/2022 with  right below-knee amputation infection .  Pharmacy has been consulted for vancomycin dosing.  Today, 11/19/2022 Day 9 vancomycin for stump infection SCr 1.07 (on 1/8 SCr = 1.25) WBC 12.5 Afebrile OR culture from 1/5 with MRSA  1/10: random vancomycin level at 12:10 = 18 mcg/ml, vancomycin '1750mg'$  IV q48h (1st dose) given 1/9 at 12:14  1/7 and 1/8: Levels were drawn at steady-state on 750 mg IV vancomycin every 12 hours (last dose prior to levels was 1/6 at 22:21)resulting in the following pharmacokinetic parameters: vanc peak was 36 mcg/ml (1/7 @ 00:29) and trough = 30 mcg/mL 1/7 @ 09:50, this does not appear to be lab error as a random level at 21:52 1/7 = 21 mcg/mL  Ke: 0.0195 h-1 T1/2: 35.5 h    Plan:  Unpredictable vancomycin pharmacokinetics at this time.  Had elevated trough on '750mg'$  IV q12h with only a small, disproportionate rise in SCr.  Random level today collected 24h after receiving a dose of '1750mg'$  IV q48h is lower than anticipated.  Plan to continue this dose but recheck a random vancomycin level with morning labs to calculate a Ke and half-life.  Follow levels and renal function for adjustments  Height: '5\' 9"'$  (175.3 cm) Weight: 111.7 kg (246 lb 4.1 oz) IBW/kg (Calculated) : 70.7 Temp (24hrs), Avg:97.7 F (36.5 C), Min:97.4 F (36.3 C), Max:98 F (36.7 C)  Recent Labs  Lab 11/14/22 0552 11/15/22 0454 11/16/22 0029 11/16/22 0447 11/16/22 0950 11/16/22 2152 11/17/22 0238 11/18/22 0418 11/19/22 0402 11/19/22 1210  WBC 9.5 8.3  --  9.6  --   --   --  12.6* 12.5*  --   CREATININE 1.30* 1.26*  --  1.18  --   --  1.25* 1.11 1.07  --   VANCOTROUGH  --   --   --   --  30*  --   --   --   --   --   VANCOPEAK  --   --  36  --   --   --   --   --   --   --   VANCORANDOM  --   --   --   --   --    < > 19 15  --  18    < > = values in this interval not displayed.     Estimated Creatinine Clearance: 91.6 mL/min (by C-G formula based on SCr of 1.07 mg/dL).    Allergies  Allergen Reactions   Ace Inhibitors Swelling and Cough    (Not on MAR at [facility])   Haloperidol And Related     Do NOT give anti-psychotics due to risk of  Torsades and QT prolongation (per Dr. Graciela Husbands request)   Other Itching    [name]   Shellfish Allergy    Penicillins Itching and Rash    Has patient had a PCN reaction causing immediate rash, facial/tongue/throat swelling, SOB or lightheadedness with hypotension: Unknown Has patient had a PCN reaction causing severe rash involving mucus membranes or skin necrosis: Unknown Has patient had a PCN reaction that required hospitalization: Unknown Has patient had a PCN reaction occurring within the last 10 years: Unknown If all of the above answers are "NO", then may proceed with Cephalosporin use.     Antimicrobials this  admission: 1/1 vancomycin >>  1/1 metronidazole >>1/8 1/1 ceftriaxone >>1/8  Microbiology results: 1/5 WCx MRSA (S = Vanco, SMZ/TMP)  Thank you for allowing pharmacy to be a part of this patient's care.  Doreene Eland, PharmD, BCPS, BCIDP Work Cell: [phone] 11/19/2022 2:19 PM

## 2022-11-19 NOTE — Progress Notes (Addendum)
Occupational Therapy Treatment Patient Details Name: NEZAR BUCKLES MRN: [id number] DOB: 10/12/1961 Today's Date: 11/19/2022   History of present illness Pt is a 62 year old male admitted to the medical unit, transfer from gero psych (admitted for SI) found to have complication of his amputation site, followed by vascular. Pt is s/p excisional debridement & drainage of abscess & application of wound vac on RLE residual limb on 11/14/22, now s/p revision and closure of RLE residual limb 11/18/22. PMH significant for CAD s/p CABG, HFrEF with recovered EF, PVD, type 2 diabetes, COPD, hypertension, hyperlipidemia, hypothyroidism, depression   OT comments  Chart reviewed, RN cleared pt for participation in OT tx session, is present to give meds at end of session. New orders acknowledged, pt did have contiue on transfer orders after procedure yesterday. Goals remain appropriate.  Tx session targeted improving functional activity tolerance in preparation for toilet tranfers/improved toileting indep. Pt requires MIN A for lateral scoot transfer on this date. MIN A for UB dressing. Discussed BUE HEP, will return at a later time as able to provide written HEP/theraband as pt was to receive meds at end of session. Progress continues to be made towards goals, OT will continue to follow acutely.   Of note, provided red theraband and brief demo of HEP at around 15:45. Good understanding, OT will continue to follow.   Recommendations for follow up therapy are one component of a multi-disciplinary discharge planning process, led by the attending physician.  Recommendations may be updated based on patient status, additional functional criteria and insurance authorization.    Follow Up Recommendations  Skilled nursing-short term rehab (<3 hours/day)     Assistance Recommended at Discharge Frequent or constant Supervision/Assistance  Patient can return home with the following  Assistance with cooking/housework;Direct  supervision/assist for medications management;Assist for transportation;Help with stairs or ramp for entrance;Direct supervision/assist for financial management;A lot of help with walking and/or transfers;A lot of help with bathing/dressing/bathroom   Equipment Recommendations  BSC/3in1;Tub/shower seat;Wheelchair (measurements OT);Wheelchair cushion (measurements OT);Hospital bed    Recommendations for Other Services      Precautions / Restrictions Precautions Precautions: Fall       Mobility Bed Mobility Overal bed mobility: Needs Assistance Bed Mobility: Supine to Sit     Supine to sit: Supervision, HOB elevated          Transfers Overall transfer level: Needs assistance Equipment used: None Transfers: Bed to chair/wheelchair/BSC            Lateral/Scoot Transfers: Min assist       Balance Overall balance assessment: Needs assistance Sitting-balance support: Single extremity supported, Feet supported Sitting balance-Leahy Scale: Good                                     ADL either performed or assessed with clinical judgement   ADL Overall ADL's : Needs assistance/impaired                 Upper Body Dressing : Minimal assistance;Sitting Upper Body Dressing Details (indicate cue type and reason): at edge of bed     Toilet Transfer: Minimal assistance Toilet Transfer Details (indicate cue type and reason): lateral scoot to bedside chair, simulated; drop arm bsc to be brought to room for trial                Extremity/Trunk Assessment  Vision       Perception     Praxis      Cognition Arousal/Alertness: Awake/alert Behavior During Therapy: WFL for tasks assessed/performed Overall Cognitive Status: Within Functional Limits for tasks assessed Area of Impairment: Awareness, Problem solving                           Awareness: Emergent Problem Solving: Requires verbal cues, Requires tactile  cues          Exercises Other Exercises Other Exercises: BUE HEP: chair push ups as able 10x 3 sets to do today; will provide written BUE strengthening HEP in the future    Shoulder Instructions       General Comments R residual limb wrapped, vital signs appear stable throughotu    Pertinent Vitals/ Pain       Pain Assessment Pain Assessment: No/denies pain  Home Living                                          Prior Functioning/Environment              Frequency  Min 2X/week        Progress Toward Goals  OT Goals(current goals can now be found in the care plan section)  Progress towards OT goals: Progressing toward goals     Plan Discharge plan remains appropriate;Frequency remains appropriate    Co-evaluation                 AM-PAC OT "6 Clicks" Daily Activity     Outcome Measure   Help from another person eating meals?: None Help from another person taking care of personal grooming?: A Little Help from another person toileting, which includes using toliet, bedpan, or urinal?: A Lot Help from another person bathing (including washing, rinsing, drying)?: A Lot Help from another person to put on and taking off regular upper body clothing?: A Little Help from another person to put on and taking off regular lower body clothing?: A Lot 6 Click Score: 16    End of Session    OT Visit Diagnosis: Unsteadiness on feet (R26.81);Muscle weakness (generalized) (M62.81)   Activity Tolerance Patient tolerated treatment well   Patient Left in chair;with call bell/phone within reach;with chair alarm set   Nurse Communication Mobility status        Time: 5379-4327 OT Time Calculation (min): 14 min  Charges: OT General Charges $OT Visit: 1 Visit OT Treatments $Therapeutic Activity: 8-22 mins  Shanon Payor, OTD OTR/L  11/19/22, 11:21 AM

## 2022-11-19 NOTE — TOC Progression Note (Addendum)
Transition of Care [facility]) - Progression Note    Patient Details  Name: Shane Chambers MRN: [id number] Date of Birth: November 28, 1960  Transition of Care [facility]) CM/SW Contact  Beverly Sessions, RN Phone Number: 11/19/2022, 10:01 AM  Clinical Narrative:     Per op note patient does not have wound vac in place.  Patient has normal saline wet-to-dry dressing was then performed using fluffed gauze and packed both medially and laterally. The wound was then padded with ABDs wrapped with 3 Curlex and an Ace wrap.    1pm - discussed case with TOC supervisor. He confirms that if patient does not receive any other bed offers he will have to return to [facility]     Expected Discharge Plan and Services                                               Social Determinants of Health (SDOH) Interventions SDOH Screenings   Food Insecurity: No Food Insecurity (11/13/2022)  Housing: Low Risk  (11/13/2022)  Transportation Needs: No Transportation Needs (11/13/2022)  Utilities: Not At Risk (11/13/2022)  Alcohol Screen: Low Risk  (11/05/2022)  Tobacco Use: Medium Risk (11/19/2022)    Readmission Risk Interventions    11/11/2022    2:05 PM 10/03/2022   10:08 AM 09/26/2022    1:16 PM  Readmission Risk Prevention Plan  Transportation Screening Complete Complete Complete  PCP or Specialist Appt within 3-5 Days   Complete  HRI or [place]   Complete  Social Work Consult for [place] Planning/Counseling   Complete  Palliative Care Screening   Not Applicable  Medication Review Press photographer)   Complete  HRI or Home Care Consult Complete Complete   SW Recovery Care/Counseling Consult Complete Complete   Palliative Care Screening Not Applicable Not Applicable   Skilled Nursing Facility Complete Complete

## 2022-11-19 NOTE — Progress Notes (Signed)
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 Pt is now s/p excisional debridement & drainage of abscess & application of wound vac on RLE residual limb on 11/14/22, now s/p revision and closure of RLE residual limb 11/18/22. PMH significant for CAD s/p CABG, HFrEF with recovered EF, PVD, type 2 diabetes, COPD, hypertension, hyperlipidemia, hypothyroidism, depression    PT Comments    Pt remains pleasant and motivated but continues to be very limited with ability to effectively take a lot of weight through b/l UEs/walker and L LE in an effective way to try and maintain standing.  He has been making improvements with transfers and bed mobility but requires heavy assist and guarding to attempt standing.  Pt reports understanding of LE and UE exercises to facilitate improved capacity with mobility/standing/etc - did lateral scoots earlier today with just min assist.  Good effort but functionally still quite limited with WBing.     Recommendations for follow up therapy are one component of a multi-disciplinary discharge planning process, led by the attending physician.  Recommendations may be updated based on patient status, additional functional criteria and insurance authorization.  Follow Up Recommendations  Skilled nursing-short term rehab (<3 hours/day) Can patient physically be transported by private vehicle: No   Assistance Recommended at Discharge Frequent or constant Supervision/Assistance  Patient can return home with the following Direct supervision/assist for medications management;Assist for transportation;Help with stairs or ramp for entrance;A little help with walking and/or transfers;A little help with  bathing/dressing/bathroom   Equipment Recommendations   (per next venue of care)    Recommendations for Other Services       Precautions / Restrictions Precautions Precautions: Fall Restrictions Weight Bearing Restrictions: Yes [name] Weight Bearing: Weight bearing as tolerated LUE Weight Bearing: Weight bearing as tolerated RLE Weight Bearing: Non weight bearing LLE Weight Bearing: Weight bearing as tolerated     Mobility  Bed Mobility               General bed mobility comments: in recliner pre/post session    Transfers Overall transfer level: Needs assistance Equipment used: Rolling walker (2 wheels) Transfers: Sit to/from Stand Sit to Stand: Mod assist, Max assist           General transfer comment: Pt reports he transferred to the recliner pretty well, eager to try doing some getting ups/standing.  We tried multiple times t/o session with ability to get hips off recliner and partially attain upright but he consistently was unable to get weight over the walker and needed light direct assist to insure L foot and knee stay in appropriate positions for [facility] during the attempts.    Ambulation/Gait               General Gait Details: unable/unsafe to attempt   Stairs             Wheelchair Mobility    Modified Rankin (Stroke Patients Only)       Balance Overall balance assessment: Needs assistance Sitting-balance support: Single extremity supported, Feet supported Sitting balance-Leahy Scale: Good     Standing balance support: During functional activity, Reliant on assistive device for balance, Bilateral upper extremity supported Standing balance-Leahy Scale: Zero  Cognition Arousal/Alertness: Awake/alert Behavior During Therapy: WFL for tasks assessed/performed Overall Cognitive Status: Within Functional Limits for tasks assessed Area of Impairment: Awareness, Problem solving                                         Exercises Total Joint Exercises Quad Sets: AROM, Strengthening, Right, 10 reps, 15 reps Hip ABduction/ADduction: Strengthening, 10 reps Long Arc Quad: 10 reps Knee Flexion: 10 reps (AROM on R, resisted on L)    General Comments        Pertinent Vitals/Pain Pain Assessment Pain Assessment: 0-10 Pain Score: 3  Pain Location: R residual limb    Home Living                          Prior Function            PT Goals (current goals can now be found in the care plan section) Progress towards PT goals: Progressing toward goals    Frequency    7X/week      PT Plan Current plan remains appropriate    Co-evaluation              AM-PAC PT "6 Clicks" Mobility   Outcome Measure  Help needed turning from your back to your side while in a flat bed without using bedrails?: None Help needed moving from lying on your back to sitting on the side of a flat bed without using bedrails?: A Little Help needed moving to and from a bed to a chair (including a wheelchair)?: A Little Help needed standing up from a chair using your arms (e.g., wheelchair or bedside chair)?: Total Help needed to walk in hospital room?: Total Help needed climbing 3-5 steps with a railing? : Total 6 Click Score: 13    End of Session         PT Visit Diagnosis: Muscle weakness (generalized) (M62.81);Difficulty in walking, not elsewhere classified (R26.2);Other abnormalities of gait and mobility (R26.89)     Time: 1308-6578 PT Time Calculation (min) (ACUTE ONLY): 28 min  Charges:  $Therapeutic Exercise: 8-22 mins $Therapeutic Activity: 8-22 mins                     Kreg Shropshire, DPT 11/19/2022, 3:30 PM

## 2022-11-19 NOTE — Progress Notes (Signed)
[facility] Progress Note  Patient: Shane Chambers    KGY:[id number]  DOA: 11/11/2022     Date of Service: the patient was seen and examined on 11/19/2022  No chief complaint on file.  Brief hospital course:  Shane Chambers is a 62 y.o. male with past medical history of CAD s/p CABG, HFrEF with recovered EF, PVD, type 2 diabetes, COPD, hypertension, hyperlipidemia, hypothyroidism, depression who is current he admitted for suicidal ideation and found to have complication of his amputation site.  Shane Chambers states that for the last 3 days, he has been experiencing increasing pain of his right BKA stump in addition to increasing erythema.  His nurse at bedside states that he has been picking at it. Patient has not noticed any purulent drainage at this time but there are areas where he is worried that the incision is opening. Per chart review, patient was admitted on 11/23 for cellulitis of the right lower extremity extending from the foot up towards the knee.  CT imaging at that time demonstrated holdable complex fluid collections and possible medial malleolus osteomyelitis.  MRI was subsequently obtained that demonstrated right ankle septic arthritis.  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  He followed up with orthopedic surgery on 12/14.  At that time, he was instructed to follow-up in 1 week for staple removal.   Assessment and Plan:  Right BKA stump abscess, s/p revision and washout done on 11/14/2022 H/o Acute osteomyelitis, s/p right BKA Initially patient was seen in consultation at [facility], however required transfer to the medical unit due to inability to administer IV antibiotics.  Patient presented with 3 days of gradually worsening right stump erythema and pain.  Initial concern for cellulitis or only, however vascular surgery was able to put a probe down to the bone with 200 cc of purulent drainage, more consistent with osteomyelitis. Inflammatory markers are elevated. Vascular  surgery consulted, s/p revision of right BKA stump, washout of Abscess in the posterior portion of the below-knee amputation stump, Deep wound culture to microbiology  Abx vancomycin, ceftriaxone, and metronidazole  1/8 wound culture growing MRSA, discontinued ceftriaxone.  Wound culture for 5 days Tylenol as needed for fever Started gabapentin, patient was taking 6000 mg p.o. twice daily changed oxycodone 5 mg p.o. every 4 hourly as needed WBC count trending down Wound care consulted 1/8 ID consulted for recommendation of antibiotics and duration of treatment  1/9 s/p Excisional debridement right below-knee amputation stump  Follow vascular surgery and ID for discharge planning    Hypomagnesemia, mag repleted.  Resolved Monitor and replete as needed.  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  EKG today with improved QTc.    Hypertension, continue amlodipine, Lopressor HLD, continue statin  Depression - Continue SSRIs and antipsychotics started on recent [place] [facility] admission  Continue Abilify and Effexor  Type 2 diabetes mellitus (HCC) - SSI, moderate - Hold home metformin  Hypothyroid, continued Synthroid Constipation, continue started MiraLAX and Dulcolax nightly.  Discussed the dose if no constipation BPH, continue Flomax Vitamin D insufficiency: started vitamin D 50,000 units p.o. weekly, follow with PCP to repeat vitamin D level after 3 to 6 months.  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  Recommended to follow  with neurology for EMG and NCS test as an outpatient  There is no height or weight on file to calculate BMI.  Interventions:    Pressure Injury 11/06/22 Buttocks Right Stage 2 -  Partial thickness loss of dermis  presenting as a shallow open injury with a red, pink wound bed without slough. no odor. no drainage. no slough. 1cm X 0.3cm (Active)  11/06/22 1800  Location: Buttocks  Location Orientation: Right  Staging: Stage 2 -  Partial thickness loss of dermis presenting as a shallow open injury with a red, pink wound bed without slough.  Wound Description (Comments): no odor. no drainage. no slough. 1cm X 0.3cm  Present on Admission:   Dressing Type None 11/18/22 0820     Diet: Heart healthy/carb modified DVT Prophylaxis: Subcutaneous Lovenox   Advance goals of care discussion: Full code  Family Communication: family was NOT present at bedside, at the time of interview.  The pt provided permission to discuss medical plan with the family. Opportunity was given to ask question and all questions were answered satisfactorily.   Disposition:  Pt is from Home, admitted with right BKA infection and depression with SI/HI ideations under BHU, still has infection, on IV antibiotics, s/p right BKA stump washout done on 1/5, second washout on 1/9 , wound culture growing MRSA, ID consulted for further recommendation, which precludes a safe discharge. Discharge to SNF, when cleared by vascular surgery and ID  Subjective: No significant events overnight, complaining of pain about 2-3/10 well-controlled with meds.  Patient was resting comfortably, denied any active issues.     Physical Exam: General:  alert oriented to time, place, and person.  Appear in no distress, affect appropriate Eyes: PERRLA ENT: Oral Mucosa Clear, moist  Neck: no JVD,  Cardiovascular: S1 and S2 Present, no Murmur,  Respiratory: good respiratory effort, Bilateral Air entry equal and Decreased, no Crackles, no wheezes Abdomen: Bowel Sound present, Soft and no tenderness,  Skin: no rashes Extremities: no Pedal edema, no calf tenderness, s/p Right BKA stump washout, dressing CDI, wound VAC was removed Neurologic: without any new focal  findings Gait not checked due to patient safety concerns  Vitals:   11/18/22 1654 11/18/22 2040 11/19/22 0315 11/19/22 0750  BP: (!) 121/58 125/68 136/71 130/65  Pulse: 61 65 68 (!) 58  Resp: '12 18 20 18  '$ Temp: 97.6 F (36.4 C) 97.9 F (36.6 C) 98 F (36.7 C) (!) 97.4 F (36.3 C)  TempSrc:      SpO2: 97% 98% 96% 97%  Weight:      Height:        Intake/Output Summary (Last 24 hours) at 11/19/2022 1518 Last data filed at 11/19/2022 1447 Gross per 24 hour  Intake 350 ml  Output 1850 ml  Net -1500 ml   Filed Weights   11/17/22 1955 11/18/22 0500 11/18/22 1438  Weight: 111.4 kg 111.7 kg 111.7 kg    Data Reviewed: I have personally reviewed and interpreted daily labs, tele strips, imagings as discussed above. I reviewed all nursing notes, pharmacy notes, vitals, pertinent old records I have discussed plan of care as described above with RN and patient/family.  CBC: Recent Labs  Lab 11/14/22 0552 11/15/22 0454 11/16/22 0447 11/18/22 0418 11/19/22 0402  WBC 9.5 8.3 9.6 12.6* 12.5*  HGB 8.3* 8.0* 7.9* 8.5* 8.2*  HCT 26.4* 24.8* 24.5* 26.6* 26.0*  MCV 90.7 89.5 90.7 91.4 90.9  PLT 293 333 323 362 683   Basic Metabolic Panel: Recent Labs  Lab 11/13/22 0857 11/14/22 0552 11/15/22 0454 11/16/22 0447 11/17/22 0238 11/18/22 0418 11/19/22 0402  NA 133* 133* 135 137  --  136 136  K 4.0 4.2 4.3 4.1  --  4.1 4.3  CL 99 100 102 102  --  101 102  CO2 '26 25 22 25  '$ --  26 25  GLUCOSE 92 109* 157* 132*  --  158* 158*  BUN 49* 63* 61* 63*  --  64* 64*  CREATININE 1.34* 1.30* 1.26* 1.18 1.25* 1.11 1.07  CALCIUM 8.3* 8.7* 9.0 8.8*  --  8.9 8.8*  MG 1.6* 1.7  --   --   --  1.8 1.9  PHOS 5.2* 4.6  --   --   --  3.3 4.4    Studies: No results found.  Scheduled Meds:  amLODipine  10 mg Oral Daily   ARIPiprazole  10 mg Oral Daily   vitamin C  500 mg Oral BID   aspirin EC  81 mg Oral Q breakfast   atorvastatin  80 mg Oral QHS   bisacodyl  10 mg Oral QHS   clotrimazole    Topical BID   enoxaparin (LOVENOX) injection  0.5 mg/kg Subcutaneous Q24H   ferrous gluconate  324 mg Oral Daily   fluticasone furoate-vilanterol  1 puff Inhalation Daily   gabapentin  600 mg Oral BID   insulin aspart  0-15 Units Subcutaneous TID AC & HS   levothyroxine  75 mcg Oral Q0600   magnesium oxide  400 mg Oral Daily   metoprolol tartrate  50 mg Oral BID   mineral oil  1 enema Rectal Once   multivitamin with minerals  1 tablet Oral Daily   pantoprazole  40 mg Oral Daily   polyethylene glycol  17 g Oral BID   polyvinyl alcohol  1 drop Both Eyes Daily   potassium chloride SA  20 mEq Oral Daily   Ensure Max Protein  11 oz Oral TID   sodium chloride flush  3 mL Intravenous Q12H   tamsulosin  0.8 mg Oral Daily   thiamine  100 mg Oral Daily   torsemide  40 mg Oral Daily   venlafaxine XR  150 mg Oral Q breakfast   Vitamin D (Ergocalciferol)  50,000 Units Oral Q7 days   zinc sulfate  220 mg Oral Daily   Continuous Infusions:  vancomycin 1,750 mg (11/18/22 1214)   PRN Meds: acetaminophen, albuterol, fentaNYL (SUBLIMAZE) injection, hydrALAZINE, HYDROmorphone (DILAUDID) injection, ipratropium-albuterol, loperamide HCl, ondansetron (ZOFRAN) IV, mouth rinse, oxyCODONE  Time spent: 35 minutes  Author: Val Riles. MD [facility] 11/19/2022 3:18 PM  To reach On-call, see care teams to locate the attending and reach out to them via [email or web address]. If 7PM-7AM, please contact night-coverage If you still have difficulty reaching the attending provider, please page the [facility] (Director on Call) for [facility] on amion for assistance.

## 2022-11-19 NOTE — Progress Notes (Signed)
   Date of Admission:  11/11/2022    ID: Shane Chambers is a 62 y.o. male Principal Problem:   Acute osteomyelitis ([place]) Active Problems:   Type 2 diabetes mellitus (HCC)   Depression   QT prolongation    Subjective: Still has pain rt BKA stump site  Medications:   amLODipine  10 mg Oral Daily   ARIPiprazole  10 mg Oral Daily   vitamin C  500 mg Oral BID   aspirin EC  81 mg Oral Q breakfast   atorvastatin  80 mg Oral QHS   bisacodyl  10 mg Oral QHS   clotrimazole   Topical BID   enoxaparin (LOVENOX) injection  0.5 mg/kg Subcutaneous Q24H   ferrous gluconate  324 mg Oral Daily   fluticasone furoate-vilanterol  1 puff Inhalation Daily   gabapentin  600 mg Oral BID   insulin aspart  0-15 Units Subcutaneous TID AC & HS   levothyroxine  75 mcg Oral Q0600   magnesium oxide  400 mg Oral Daily   metoprolol tartrate  50 mg Oral BID   mineral oil  1 enema Rectal Once   multivitamin with minerals  1 tablet Oral Daily   pantoprazole  40 mg Oral Daily   polyethylene glycol  17 g Oral BID   polyvinyl alcohol  1 drop Both Eyes Daily   potassium chloride SA  20 mEq Oral Daily   Ensure Max Protein  11 oz Oral TID   sodium chloride flush  3 mL Intravenous Q12H   tamsulosin  0.8 mg Oral Daily   thiamine  100 mg Oral Daily   torsemide  40 mg Oral Daily   venlafaxine XR  150 mg Oral Q breakfast   Vitamin D (Ergocalciferol)  50,000 Units Oral Q7 days   zinc sulfate  220 mg Oral Daily    Objective: Vital signs in last 24 hours: Temp:  [97.4 F (36.3 C)-98 F (36.7 C)] 97.4 F (36.3 C) (01/10 0750) Pulse Rate:  [58-68] 58 (01/10 0750) Resp:  [2-20] 18 (01/10 0750) BP: (113-136)/(55-71) 130/65 (01/10 0750) SpO2:  [92 %-98 %] 97 % (01/10 0750) Weight:  [111.7 kg] 111.7 kg (01/09 1438)    PHYSICAL EXAM:  General: Alert, cooperative, no distress, appears stated age.  Lungs: Clear to auscultation bilaterally. No Wheezing or Rhonchi. No rales. Heart: Regular rate and rhythm, no  murmur, rub or gallop. Abdomen: Soft, non-tender,not distended. Bowel sounds normal. No masses Extremities:  RT BKA      Skin: No rashes or lesions. Or bruising Lymph: Cervical, supraclavicular normal. Neurologic: Grossly non-focal  Lab Results Recent Labs    11/18/22 0418 11/19/22 0402  WBC 12.6* 12.5*  HGB 8.5* 8.2*  HCT 26.6* 26.0*  NA 136 136  K 4.1 4.3  CL 101 102  CO2 26 25  BUN 64* 64*  CREATININE 1.11 1.07   Microbiology: Stump culture MRSA Studies/Results: No results found.   Assessment/Plan: MRSA wound infection of the Rt BKA stump- S/p debridement and wound vac placement-but now wound vac has been removed-  continue vanco-  Ceftriaxone and flagyl Dcl Linezolid is a better option for Skin and soft tissue infection but he is on antidepressants and risk for serotonin syndrome increases   DM on insulin   HTN on amlodipine, metoprolol and torsemide   Depression/Anxiety- on Venlafaxine and aripiprazole   Discussed the management with the patient

## 2022-11-19 NOTE — Progress Notes (Signed)
Nutrition Follow Up Note   DOCUMENTATION CODES:   Obesity unspecified  INTERVENTION:   Ensure Max protein supplement TID, each supplement provides 150kcal and 30g of protein.  MVI po daily   Vitamin C '500mg'$  po BID  Zinc '220mg'$  po daily x 14 days  NUTRITION DIAGNOSIS:   Increased nutrient needs related to wound healing as evidenced by estimated needs.  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  Pt s/p I & D 1/9  Pt with good appetite and oral intake in hospital; pt eating 100% of most meals and is drinking the Ensure Max. Pt's vitamin labs returned and are sufficient to support wound healing. Recommend continue supplements and vitamins until wound healing is complete. Per chart, pt is weight stable since admission.    Medications reviewed and include: vitamin C, aspirin, dulcolax, lovenox, ferrous gluconate, insulin, synthroid, Mg oxide, MVI, protonix, KCl, thiamine, torsemide, D2, zinc, vancomycin   Labs reviewed: K 4.3 wnl, BUN 64(H), P 4.4 wnl, Mg 1.9 wnl Vitamin D 28.25(L), vitamin A 70.2(H), vitamin C 1.4 wnl, vitamin E 9.7 wnl, zinc 52 wnl, copper 111 wnl- 1/4 WBC- 12.5(H), Hgb 8.2(L), Hct 26.0(L) Cbgs- 207, 145 x 24 hrs   Diet Order:   Diet Order             Diet Carb Modified Fluid consistency: Thin; Room service appropriate? Yes  Diet effective now                  EDUCATION NEEDS:   Education needs have been addressed  Skin:  Skin Assessment: Reviewed RN Assessment (Stage II buttocks, incision R leg s/p recent BKA)  Last BM:  1/8- type 1  Height:   Ht Readings from Last 1 Encounters:  11/18/22 '5\' 9"'$  (1.753 m)    Weight:    Wt Readings from Last 1 Encounters:  11/18/22 111.7 kg    Ideal Body Weight:  72.7 kg  Estimated Nutritional Needs:   Kcal:  2100-2400kcal/day  Protein:  105-120g/day  Fluid:  1.8-2.1L/day  Koleen Distance MS, RD, LDN Please refer to [facility] for RD and/or RD on-call/weekend/after hours pager

## 2022-11-20 ENCOUNTER — Encounter: Payer: Self-pay | Admitting: Vascular Surgery

## 2022-11-20 LAB — GLUCOSE, CAPILLARY
Glucose-Capillary: 127 mg/dL — ABNORMAL HIGH (ref 70–99)
Glucose-Capillary: 145 mg/dL — ABNORMAL HIGH (ref 70–99)
Glucose-Capillary: 184 mg/dL — ABNORMAL HIGH (ref 70–99)
Glucose-Capillary: 140 mg/dL — ABNORMAL HIGH (ref 70–99)

## 2022-11-20 LAB — BASIC METABOLIC PANEL
Anion gap: 9 (ref 5–15)
BUN: 68 mg/dL — ABNORMAL HIGH (ref 6–20)
CO2: 25 mmol/L (ref 22–32)
Calcium: 8.7 mg/dL — ABNORMAL LOW (ref 8.9–10.3)
Chloride: 101 mmol/L (ref 98–111)
Creatinine, Ser: 1.22 mg/dL (ref 0.61–1.24)
GFR, Estimated: >60 mL/min (ref >60)
Glucose, Bld: 142 mg/dL — ABNORMAL HIGH (ref 70–99)
Potassium: 4.4 mmol/L (ref 3.5–5.1)
Sodium: 135 mmol/L (ref 135–145)

## 2022-11-20 LAB — PHOSPHORUS: Phosphorus: 5.1 mg/dL — ABNORMAL HIGH (ref 2.5–4.6)

## 2022-11-20 LAB — CBC
HCT: 25.3 % — ABNORMAL LOW (ref 39.0–52.0)
Hemoglobin: 7.9 g/dL — ABNORMAL LOW (ref 13.0–17.0)
MCH: 28.8 pg (ref 26.0–34.0)
MCHC: 31.2 g/dL (ref 30.0–36.0)
MCV: 92.3 fL (ref 80.0–100.0)
Platelets: 335 10*3/uL (ref 150–400)
RBC: 2.74 MIL/uL — ABNORMAL LOW (ref 4.22–5.81)
RDW: 15.9 % — ABNORMAL HIGH (ref 11.5–15.5)
WBC: 12.5 10*3/uL — ABNORMAL HIGH (ref 4.0–10.5)
nRBC: 0 % (ref 0.0–0.2)

## 2022-11-20 LAB — MAGNESIUM: Magnesium: 2 mg/dL (ref 1.7–2.4)

## 2022-11-20 LAB — VANCOMYCIN, RANDOM: Vancomycin Rm: 14 ug/mL

## 2022-11-20 MED ORDER — BISACODYL 10 MG RE SUPP
10.0000 mg | Freq: Once | RECTAL | Status: AC
  Administered 2022-11-20: 10 mg via RECTAL
  Filled 2022-11-20: qty 1

## 2022-11-20 MED ORDER — VANCOMYCIN HCL 1500 MG/300ML IV SOLN
1500.0000 mg | INTRAVENOUS | Status: DC
  Administered 2022-11-20: 1500 mg via INTRAVENOUS
  Filled 2022-11-20: qty 300

## 2022-11-20 MED ORDER — VANCOMYCIN HCL 1250 MG/250ML IV SOLN
1250.0000 mg | INTRAVENOUS | Status: DC
  Filled 2022-11-20: qty 250

## 2022-11-20 NOTE — Progress Notes (Signed)
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 Pt is now s/p excisional debridement & drainage of abscess & application of wound vac on RLE residual limb on 11/14/22,  revision and closure of RLE residual limb 11/18/22. PMH significant for CAD s/p CABG, HFrEF with recovered EF, PVD, type 2 diabetes, COPD, hypertension, hyperlipidemia, hypothyroidism, depression   OT comments  Shane Chambers appears tired this afternoon and reports 7/10 pain but is nevertheless willing to participate in therapy. He is able to perform bed mobility without physical assistance. Attempts sit<>stand with RW and Mod-Max A, is able to clear buttocks from mattress but unable to achieve full standing balance. Engages in UE therex in sitting, performing chair push-ups, able to hold self in elevated position for ~ 5+ seconds with each push-up. Engaged in discussion re: DC recs, with pt acknowledging that DC to a homeless shelter would not be a good option for him. Pt verbalizes that he will give renewed consideration to [place] but states he strongly prefers that another rehab option becomes available.   Recommendations for follow up therapy are one component of a multi-disciplinary discharge planning process, led by the attending physician.  Recommendations may be updated based on patient status, additional functional criteria and insurance authorization.    Follow Up Recommendations  Skilled nursing-short term rehab (<3 hours/day)     Assistance Recommended at Discharge Frequent or constant Supervision/Assistance  Patient can return home with the following  Assistance with cooking/housework;Direct supervision/assist for medications management;Assist for  transportation;Help with stairs or ramp for entrance;Direct supervision/assist for financial management;A lot of help with walking and/or transfers;A lot of help with bathing/dressing/bathroom   Equipment Recommendations  BSC/3in1;Tub/shower seat;Wheelchair (measurements OT);Wheelchair cushion (measurements OT);Hospital bed    Recommendations for Other Services      Precautions / Restrictions Precautions Precautions: Fall Restrictions Weight Bearing Restrictions: Yes Other Position/Activity Restrictions: no activity/WB orders in chart, wound along R BKA incision, treated as NWBing today       Mobility Bed Mobility Overal bed mobility: Needs Assistance Bed Mobility: Supine to Sit, Sit to Supine, Rolling Rolling: Modified independent (Device/Increase time)   Supine to sit: Modified independent (Device/Increase time), HOB elevated Sit to supine: Modified independent (Device/Increase time), HOB elevated        Transfers Overall transfer level: Needs assistance Equipment used: Rolling walker (2 wheels) Transfers: Sit to/from Stand Sit to Stand: Mod assist           General transfer comment: Pt attempts sit<>stand with RW from elevated surface. Pt able to clear buttocks from mattress for a few seconds but unable to achieve full standing posture     Balance Overall balance assessment: Needs assistance Sitting-balance support: Single extremity supported Sitting balance-Leahy Scale: Good Sitting balance - Comments: no LOB while seated EOB   Standing balance support: Reliant on assistive device for balance, Bilateral upper extremity supported Standing balance-Leahy Scale: Zero Standing balance comment: Pt unable to achieve full standing balance w/ RW and mod-max A                           ADL either performed or assessed with clinical judgement   ADL  Extremity/Trunk Assessment Upper Extremity  Assessment Upper Extremity Assessment: Generalized weakness [name] Deficits / Details: Thenar and general UE atrophy visually apparent. R UE weaker than L.  Does not show great grip or tricep b/l (R grossly 3+/5, L -4/5), lacks ability to fully open/extend fingers. [name] Coordination: decreased fine motor;decreased gross motor   Lower Extremity Assessment RLE Deficits / Details: s/p complication of R BKA site        Vision       Perception     Praxis      Cognition Arousal/Alertness: Awake/alert Behavior During Therapy: WFL for tasks assessed/performed Overall Cognitive Status: Within Functional Limits for tasks assessed                                          Exercises Other Exercises Other Exercises: Mod-max effort sit to stands from elevated EOB x 4 reps; UE therex - chair push-ups 5 reps x 2 sets    Shoulder Instructions       General Comments      Pertinent Vitals/ Pain       Pain Assessment Pain Score: 7  Pain Location: R residual limb Pain Descriptors / Indicators: Aching, Grimacing Pain Intervention(s): Limited activity within patient's tolerance, Repositioned, Relaxation  Home Living                                          Prior Functioning/Environment              Frequency  Min 2X/week        Progress Toward Goals  OT Goals(current goals can now be found in the care plan section)  Progress towards OT goals: Progressing toward goals  Acute Rehab OT Goals OT Goal Formulation: With patient Time For Goal Achievement: 11/26/22 Potential to Achieve Goals: [place] Discharge plan remains appropriate;Frequency remains appropriate    Co-evaluation                 AM-PAC OT "6 Clicks" Daily Activity     Outcome Measure   Help from another person eating meals?: None Help from another person taking care of personal grooming?: A Little Help from another person toileting, which includes using toliet,  bedpan, or urinal?: A Lot Help from another person bathing (including washing, rinsing, drying)?: A Lot Help from another person to put on and taking off regular upper body clothing?: A Little Help from another person to put on and taking off regular lower body clothing?: A Lot 6 Click Score: 16    End of Session Equipment Utilized During Treatment: Rolling walker (2 wheels)  OT Visit Diagnosis: Unsteadiness on feet (R26.81);Muscle weakness (generalized) (M62.81);Pain   Activity Tolerance Patient tolerated treatment well   Patient Left in bed;with call bell/phone within reach;with bed alarm set   Nurse Communication          Time: 1445-1500 OT Time Calculation (min): 15 min  Charges: OT Treatments $Self Care/Home Management : 8-22 mins Josiah Lobo, PhD, MS, OTR/L 11/20/22, 3:38 PM

## 2022-11-20 NOTE — Anesthesia Postprocedure Evaluation (Signed)
Anesthesia Post Note  Patient: Shane Chambers  Procedure(s) Performed: AMPUTATION BELOW KNEE REVISION AND CLOSURE (Right: Knee)  Patient location during evaluation: PACU Anesthesia Type: General Level of consciousness: awake and alert Pain management: pain level controlled Vital Signs Assessment: post-procedure vital signs reviewed and stable Respiratory status: spontaneous breathing, nonlabored ventilation, respiratory function stable and patient connected to nasal cannula oxygen Cardiovascular status: blood pressure returned to baseline and stable Postop Assessment: no apparent nausea or vomiting Anesthetic complications: no   There were no known notable events for this encounter.   Last Vitals:  Vitals:   11/20/22 0319 11/20/22 0801  BP: 126/64 125/64  Pulse: (!) 56 (!) 54  Resp: 14 18  Temp: (!) 36.4 C (!) 36.3 C  SpO2: 98% 96%    Last Pain:  Vitals:   11/20/22 1020  TempSrc:   PainSc: 6                  Martha Clan

## 2022-11-20 NOTE — Progress Notes (Signed)
[facility] Progress Note  Shane Chambers: Shane Chambers    [name]  DOA: 11/11/2022     Date of Service: the Shane Chambers was seen and examined on 11/20/2022  No chief complaint on file.  Brief hospital course:  Shane Chambers is a 62 y.o. male with past medical history of CAD s/p CABG, HFrEF with recovered EF, PVD, type 2 diabetes, COPD, hypertension, hyperlipidemia, hypothyroidism, depression who is current he admitted for suicidal ideation and found to have complication of his amputation site.  Shane Chambers states that for the last 3 days, he has been experiencing increasing pain of his right BKA stump in addition to increasing erythema.  His nurse at bedside states that he has been picking at it. Shane Chambers has not noticed any purulent drainage at this time but there are areas where he is worried that the incision is opening. Per chart review, Shane Chambers was admitted on 11/23 for cellulitis of the right lower extremity extending from the foot up towards the knee.  CT imaging at that time demonstrated holdable complex fluid collections and possible medial malleolus osteomyelitis.  MRI was subsequently obtained that demonstrated right ankle septic arthritis.  Shane Chambers underwent below-knee amputation on 11/26.  He followed up with orthopedic surgery on 12/14.  At that time, he was instructed to follow-up in 1 week for staple removal.   Assessment and Plan:  Right BKA stump abscess, s/p revision and washout done on 11/14/2022 H/o Acute osteomyelitis, s/p right BKA Initially Shane Chambers was seen in consultation at [facility], however required transfer to the medical unit due to inability to administer IV antibiotics.  Shane Chambers presented with 3 days of gradually worsening right stump erythema and pain.  Initial concern for cellulitis or only, however vascular surgery was able to put a probe down to the bone with 200 cc of purulent drainage, more consistent with osteomyelitis. Inflammatory markers are elevated. Vascular  surgery consulted, s/p revision of right BKA stump, washout of Abscess in the posterior portion of the below-knee amputation stump, Deep wound culture to microbiology  S/p ceftriaxone and metronidazole, which were discontinued by ID Continue vancomycin IV   1/8 wound culture growing MRSA, discontinued ceftriaxone.  Wound culture for 5 days Tylenol as needed for fever Started gabapentin, Shane Chambers was taking 6000 mg p.o. twice daily changed oxycodone 5 mg p.o. every 4 hourly as needed WBC count trending down Wound care consulted 1/8 ID consulted for recommendation of antibiotics and duration of treatment  1/9 s/p Excisional debridement right below-knee amputation stump  1/10 seen by vascular surgery, recommended Dressing changed today.  Wet-to-dry's with gauze packing the wound covered with ABDs and wrapped in Kerlix.  Follow vascular surgery and ID for discharge planning    Hypomagnesemia, mag repleted.  Resolved Monitor and replete as needed.  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  EKG today with improved QTc.    Hypertension, continue amlodipine, Lopressor HLD, continue statin  Depression - Continue SSRIs and antipsychotics started on recent [facility] admission  Continue Abilify and Effexor  Type 2 diabetes mellitus (HCC) - SSI, moderate - Hold home metformin  Hypothyroid, continued Synthroid Constipation, continue started MiraLAX and Dulcolax nightly.  Discussed the dose if no constipation BPH, continue Flomax Vitamin D insufficiency: started vitamin D 50,000 units p.o. weekly, follow with PCP to repeat vitamin D level after 3 to 6 months.  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  Recommended to follow with neurology for EMG  and NCS test as an outpatient  There is no height or weight on file to calculate BMI.  Interventions:    Pressure Injury 11/06/22 Buttocks Right Stage 2 -  Partial thickness loss of dermis presenting as a shallow open injury with a red, pink wound bed without slough. no odor. no drainage. no slough. 1cm X 0.3cm (Active)  11/06/22 1800  Location: Buttocks  Location Orientation: Right  Staging: Stage 2 -  Partial thickness loss of dermis presenting as a shallow open injury with a red, pink wound bed without slough.  Wound Description (Comments): no odor. no drainage. no slough. 1cm X 0.3cm  Present on Admission:   Dressing Type None 11/18/22 0820     Diet: Heart healthy/carb modified DVT Prophylaxis: Subcutaneous Lovenox   Advance goals of care discussion: Full code  Family Communication: family was NOT present at bedside, at the time of interview.  The pt provided permission to discuss medical plan with the family. Opportunity was given to ask question and all questions were answered satisfactorily.   Disposition:  Pt is from SNF, admitted with right BKA infection and depression with SI/HI ideations under BHU, still has infection, on IV antibiotics, s/p right BKA stump washout done on 1/5, second washout on 1/9 , wound culture growing MRSA, ID consulted for further recommendation, which precludes a safe discharge. Discharge to SNF, when cleared by vascular surgery and ID 1/11 as per [facility] Shane Chambers is refusing to go back to SNF from where he came and has no offers from other SNF facilities.   Subjective: No significant events overnight, complaining of pain about 5/10 well-controlled with meds.  Shane Chambers was resting comfortably, denied any active issues.     Physical Exam: General:  alert oriented to time, place, and person.  Appear in no distress, affect appropriate Eyes: PERRLA ENT: Oral Mucosa Clear, moist  Neck: no JVD,  Cardiovascular: S1 and S2 Present, no Murmur,  Respiratory:  good respiratory effort, Bilateral Air entry equal and Decreased, no Crackles, no wheezes Abdomen: Bowel Sound present, Soft and no tenderness,  Skin: no rashes Extremities: no Pedal edema, no calf tenderness, s/p Right BKA stump washout, dressing CDI, wound VAC was removed Neurologic: without any new focal findings Gait not checked due to Shane Chambers safety concerns  Vitals:   11/19/22 1603 11/19/22 1931 11/20/22 0319 11/20/22 0801  BP: (!) 133/56 127/64 126/64 125/64  Pulse: 60 63 (!) 56 (!) 54  Resp: '20 18 14 18  '$ Temp: 98.3 F (36.8 C) 98 F (36.7 C) (!) 97.5 F (36.4 C) (!) 97.4 F (36.3 C)  TempSrc:      SpO2: 93% 96% 98% 96%  Weight:      Height:       No intake or output data in the 24 hours ending 11/20/22 1627  Filed Weights   11/17/22 1955 11/18/22 0500 11/18/22 1438  Weight: 111.4 kg 111.7 kg 111.7 kg    Data Reviewed: I have personally reviewed and interpreted daily labs, tele strips, imagings as discussed above. I reviewed all nursing notes, pharmacy notes, vitals, pertinent old records I have discussed plan of care as described above with RN and Shane Chambers/family.  CBC: Recent Labs  Lab 11/15/22 0454 11/16/22 0447 11/18/22 0418 11/19/22 0402 11/20/22 0400  WBC 8.3  9.6 12.6* 12.5* 12.5*  HGB 8.0* 7.9* 8.5* 8.2* 7.9*  HCT 24.8* 24.5* 26.6* 26.0* 25.3*  MCV 89.5 90.7 91.4 90.9 92.3  PLT 333 323 362 350 478   Basic Metabolic Panel: Recent Labs  Lab 11/14/22 0552 11/15/22 0454 11/16/22 0447 11/17/22 0238 11/18/22 0418 11/19/22 0402 11/20/22 0400  NA 133* 135 137  --  136 136 135  K 4.2 4.3 4.1  --  4.1 4.3 4.4  CL 100 102 102  --  101 102 101  CO2 '25 22 25  '$ --  '26 25 25  '$ GLUCOSE 109* 157* 132*  --  158* 158* 142*  BUN 63* 61* 63*  --  64* 64* 68*  CREATININE 1.30* 1.26* 1.18 1.25* 1.11 1.07 1.22  CALCIUM 8.7* 9.0 8.8*  --  8.9 8.8* 8.7*  MG 1.7  --   --   --  1.8 1.9 2.0  PHOS 4.6  --   --   --  3.3 4.4 5.1*    Studies: No results found.   Scheduled Meds:  amLODipine  10 mg Oral Daily   ARIPiprazole  10 mg Oral Daily   vitamin C  500 mg Oral BID   aspirin EC  81 mg Oral Q breakfast   atorvastatin  80 mg Oral QHS   bisacodyl  10 mg Oral QHS   bisacodyl  10 mg Rectal Once   clotrimazole   Topical BID   enoxaparin (LOVENOX) injection  0.5 mg/kg Subcutaneous Q24H   ferrous gluconate  324 mg Oral Daily   fluticasone furoate-vilanterol  1 puff Inhalation Daily   gabapentin  600 mg Oral BID    HYDROmorphone (DILAUDID) injection  0.5 mg Intravenous STAT    HYDROmorphone (DILAUDID) injection  0.5 mg Intravenous BID   insulin aspart  0-15 Units Subcutaneous TID AC & HS   levothyroxine  75 mcg Oral Q0600   magnesium oxide  400 mg Oral Daily   metoprolol tartrate  50 mg Oral BID   mineral oil  1 enema Rectal Once   multivitamin with minerals  1 tablet Oral Daily   pantoprazole  40 mg Oral Daily   polyethylene glycol  17 g Oral BID   polyvinyl alcohol  1 drop Both Eyes Daily   potassium chloride SA  20 mEq Oral Daily   Ensure Max Protein  11 oz Oral TID   sodium chloride flush  3 mL Intravenous Q12H   tamsulosin  0.8 mg Oral Daily   thiamine  100 mg Oral Daily   torsemide  40 mg Oral Daily   venlafaxine XR  150 mg Oral Q breakfast   Vitamin D (Ergocalciferol)  50,000 Units Oral Q7 days   zinc sulfate  220 mg Oral Daily   Continuous Infusions:  vancomycin 1,500 mg (11/20/22 1220)   PRN Meds: acetaminophen, albuterol, fentaNYL (SUBLIMAZE) injection, hydrALAZINE, ipratropium-albuterol, loperamide HCl, ondansetron (ZOFRAN) IV, mouth rinse, oxyCODONE  Time spent: 35 minutes  Author: Val Riles. MD [facility] 11/20/2022 4:27 PM  To reach On-call, see care teams to locate the attending and reach out to them via [email or web address]. If 7PM-7AM, please contact night-coverage If you still have difficulty reaching the attending provider, please page the [facility] (Director on Call) for [facility] on amion for  assistance.

## 2022-11-20 NOTE — TOC Progression Note (Addendum)
Transition of Care [facility]) - Progression Note    Patient Details  Name: Shane Chambers MRN: [id number] Date of Birth: February 07, 1961  [facility] CM/SW [place], LCSW Phone Number: 11/20/2022, 12:43 PM  Clinical Narrative:  Went into room and told patient that [facility] is still his only SNF option at this time. Patient still says he does not want to return. CSW talked to him about going to homeless shelter if he does not want to return to SNF. Provided phone number to shelter and explained that he will need to arrange for assessment before they will accept him.   2:04 pm: Discussed case with team. Patient has 34 SNF denials and 10 facilities that have not responded yet. Out of those 10 that have not responded, CSW contacted [facility], [facility], [facility], and [facility] admissions coordinator. Left message for [facility]. [facility], [facility], and [facility] do not have LTC beds. [facility] is reviewing.  2:15 pm: [facility] and [facility] will review referral. [facility], [facility], and [facility] do not have LTC beds. Left message for admissions at [facility]. No answer at [facility]. Unable to leave a message.  3:47 pm: [facility] declined.  Expected Discharge Plan and Services                                               Social Determinants of Health (SDOH) Interventions SDOH Screenings   Food Insecurity: No Food Insecurity (11/13/2022)  Housing: Low Risk  (11/13/2022)  Transportation Needs: No Transportation Needs (11/13/2022)  Utilities: Not At Risk (11/13/2022)  Alcohol Screen: Low Risk  (11/05/2022)  Tobacco Use: Medium Risk (11/20/2022)    Readmission Risk Interventions    11/11/2022    2:05 PM 10/03/2022   10:08 AM 09/26/2022    1:16 PM  Readmission Risk Prevention Plan  Transportation Screening Complete Complete Complete  PCP or Specialist Appt within 3-5 Days   Complete   HRI or [place]   Complete  Social Work Consult for [place] Planning/Counseling   Complete  Palliative Care Screening   Not Applicable  Medication Review [employer])   Complete  HRI or Home Care Consult Complete Complete   SW Recovery Care/Counseling Consult Complete Complete   Palliative Care Screening Not Applicable Not Applicable   Skilled Nursing Facility Complete Complete

## 2022-11-20 NOTE — Progress Notes (Signed)
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 Pt is now s/p excisional debridement & drainage of abscess & application of wound vac on RLE residual limb on 11/14/22,  revision and closure of RLE residual limb 11/18/22. PMH significant for CAD s/p CABG, HFrEF with recovered EF, PVD, type 2 diabetes, COPD, hypertension, hyperlipidemia, hypothyroidism, depression    PT Comments    Pt continues to be pleasant and motivated but L sided weakness continues to limit his functional WBing/standing ability.  Pt making good gains with bed mobility and lateral scoot transfers, showing increased awareness with LE exercises a need to be able to keep L LE in position to WB but he buckled after only very short time near standing with max assist and plenty of cuing.     Recommendations for follow up therapy are one component of a multi-disciplinary discharge planning process, led by the attending physician.  Recommendations may be updated based on patient status, additional functional criteria and insurance authorization.  Follow Up Recommendations  Skilled nursing-short term rehab (<3 hours/day) Can patient physically be transported by private vehicle: No   Assistance Recommended at Discharge Frequent or constant Supervision/Assistance  Patient can return home with the following Direct supervision/assist for medications management;Assist for transportation;Help with stairs or ramp for entrance;A little help with walking and/or transfers;A little help with bathing/dressing/bathroom   Equipment Recommendations   (per next venue)    Recommendations for Other Services       Precautions / Restrictions Precautions Precautions: Fall Restrictions RLE  Weight Bearing: Non weight bearing LLE Weight Bearing: Weight bearing as tolerated     Mobility  Bed Mobility Overal bed mobility: Needs Assistance   Rolling: Supervision Sidelying to sit: Min guard            Transfers Overall transfer level: Needs assistance Equipment used: None Transfers: Sit to/from Stand, Bed to chair/wheelchair/BSC Sit to Stand: Mod assist          Lateral/Scoot Transfers: Min assist General transfer comment: Pt was able to do lateral scoot transfer from bed to recliner (no slide board, arm rest dropped) with slow but progressively moving effort.  He did need some vcs for strategy, insuring minimized WBing through R limb as well as assist with sheet management but ultimately moving with improved. confidednce and independence. Standing attempt similar to yesterday's with assist to initiate rise to standing but inability of L LE to get to extension/standing and ultimately not having the U or LE strength to maintain    Ambulation/Gait               General Gait Details: unable/unsafe to attempt   Stairs             Wheelchair Mobility    Modified Rankin (Stroke Patients Only)       Balance Overall balance assessment: Needs assistance Sitting-balance support: Single extremity supported, Feet supported Sitting balance-Leahy Scale: Good     Standing balance support: During functional activity, Reliant on assistive device for balance, Bilateral upper extremity supported Standing balance-Leahy Scale: Zero Standing balance comment: constant max assist to maintain standing for even very short period of time  Cognition Arousal/Alertness: Awake/alert Behavior During Therapy: WFL for tasks assessed/performed Overall Cognitive Status: Within Functional Limits for tasks assessed                                          Exercises Amputee Exercises Quad Sets: Strengthening, 10 reps,  Both Hip Extension: AROM, 10 reps Hip ABduction/ADduction: Strengthening, 10 reps (light manual resist) Knee Flexion: Strengthening, 10 reps, Both Knee Extension: Strengthening, Both, 10 reps Chair Push Up: AROM, 5 reps    General Comments        Pertinent Vitals/Pain Pain Assessment Faces Pain Scale: Hurts a little bit Pain Location: R residual limb    Home Living                          Prior Function            PT Goals (current goals can now be found in the care plan section) Progress towards PT goals: Progressing toward goals    Frequency    7X/week      PT Plan Current plan remains appropriate    Co-evaluation              AM-PAC PT "6 Clicks" Mobility   Outcome Measure  Help needed turning from your back to your side while in a flat bed without using bedrails?: None Help needed moving from lying on your back to sitting on the side of a flat bed without using bedrails?: A Little Help needed moving to and from a bed to a chair (including a wheelchair)?: A Little Help needed standing up from a chair using your arms (e.g., wheelchair or bedside chair)?: Total Help needed to walk in hospital room?: Total Help needed climbing 3-5 steps with a railing? : Total 6 Click Score: 13    End of Session Equipment Utilized During Treatment: Gait belt Activity Tolerance: Patient tolerated treatment well Patient left: in chair;with chair alarm set;with call bell/phone within reach Nurse Communication: Mobility status PT Visit Diagnosis: Muscle weakness (generalized) (M62.81);Difficulty in walking, not elsewhere classified (R26.2);Other abnormalities of gait and mobility (R26.89)     Time: 6659-9357 PT Time Calculation (min) (ACUTE ONLY): 28 min  Charges:  $Therapeutic Exercise: 8-22 mins $Therapeutic Activity: 8-22 mins                     Kreg Shropshire, DPT 11/20/2022, 10:35 AM

## 2022-11-20 NOTE — Progress Notes (Signed)
  Progress Note    11/20/2022 4:53 PM 2 Days Post-Op  Subjective:  Shane Chambers is a 62 year old male who is now POD # 2 status post wound washout of left BKA stump infection.  Denies any pain.  Denies any drainage from the wound.  Complications to note.  Vitals all remained stable.    Vitals:   11/20/22 0319 11/20/22 0801  BP: 126/64 125/64  Pulse: (!) 56 (!) 54  Resp: 14 18  Temp: (!) 97.5 F (36.4 C) (!) 97.4 F (36.3 C)  SpO2: 98% 96%   Physical Exam: Cardiac:  RRR Lungs: Lungs clear throughout with diminished in the bases Incisions: Open to left BKA stump.  2 nylon retention sutures in place.  Wet-to-dry dressings in the openings. Extremities: Left BKA healing as expected post infection. Abdomen: Bowel sounds all 4 quadrants, soft, NT/ND. Neurologic: AAO x 3 person, place and time  CBC    Component Value Date/Time   WBC 12.5 (H) 11/20/2022 0400   RBC 2.74 (L) 11/20/2022 0400   HGB 7.9 (L) 11/20/2022 0400   HCT 25.3 (L) 11/20/2022 0400   PLT 335 11/20/2022 0400   MCV 92.3 11/20/2022 0400   MCH 28.8 11/20/2022 0400   MCHC 31.2 11/20/2022 0400   RDW 15.9 (H) 11/20/2022 0400   LYMPHSABS 1.5 11/10/2022 1916   MONOABS 1.0 11/10/2022 1916   EOSABS 0.7 (H) 11/10/2022 1916   BASOSABS 0.1 11/10/2022 1916    BMET    Component Value Date/Time   NA 135 11/20/2022 0400   K 4.4 11/20/2022 0400   CL 101 11/20/2022 0400   CO2 25 11/20/2022 0400   GLUCOSE 142 (H) 11/20/2022 0400   BUN 68 (H) 11/20/2022 0400   CREATININE 1.22 11/20/2022 0400   CALCIUM 8.7 (L) 11/20/2022 0400   GFRNONAA >60 11/20/2022 0400   GFRAA >60 08/07/2020 0255    INR    Component Value Date/Time   INR 1.5 (H) 09/25/2022 1801    No intake or output data in the 24 hours ending 11/20/22 1653   Assessment/Plan:  62 y.o. male is s/p Left BKS Incision and Drainage of stump infection. 2 Days Post-Op  Dressing changed today.  Wet-to-dry's with gauze packing the wound covered with ABDs and  wrapped in Kerlix.  No purulent drainage noted.  2 nylon retention sutures remain in place.  Drainage to note. 2.  Recommend continued antibiotic coverage for MRSA. 3.  Vascular surgery will continue to follow and nursing to do daily dressing changes.   Drema Pry [facility] 11/20/2022 4:53 PM

## 2022-11-20 NOTE — Progress Notes (Signed)
Pharmacy Antibiotic Note  Shane Chambers is a 62 y.o. male w/ PMH of BPH, DM, CAD, CABG, CHF, HTN, hypothyroidism, depression, BKA admitted on 11/11/2022 with  right below-knee amputation infection .  Pharmacy has been consulted for vancomycin dosing.  Today, 11/20/2022 Day 10 vancomycin for stump infection SCr 1.22 - ? Trend up from 1/10 WBC 12.5 - unchanged Afebrile OR culture from 1/5 with MRSA (susc to vanco, tmp/smz) Unexpected vancomycin kinetics based his SCr  Level information 1/11 random vancomycin level today at 0400 = 14 mcg/mL Calculated Ke = 0.0157 for half-life of 44 hours   1/10: random vancomycin level at 12:10 = 18 mcg/ml, vancomycin '1750mg'$  IV q48h (1st dose) given 1/9 at 12:14  1/7 and 1/8: Levels were drawn at steady-state on 750 mg IV vancomycin every 12 hours (last dose prior to levels was 1/6 at 22:21)resulting in the following pharmacokinetic parameters: vanc peak was 36 mcg/ml (1/7 @ 00:29) and trough = 30 mcg/mL 1/7 @ 09:50, this does not appear to be lab error as a random level at 21:52 1/7 = 21 mcg/mL  Ke: 0.0195 h-1 T1/2: 35.5 h  Plan:  Patient clearing vancomycin much slower than expected for his SCr value (possible has reduced muscle mass thus SCr overestimating his true renal function?).  Based on his Ke (half-life) today and using his calculated volume of distribution from 1/7 levels(Vd =97L). Will adjust his vancomycin dose to '1500mg'$  IV q48h with next dose due at 12p today Estimate this dose will provide AUC = 497.4 with a Cmax = 30 and Cmin = 14.2 Goal AUC 400-600 Follow levels and renal function for adjustments.  Current plan will be to check a peak and random when approaches steady-state.    Height: '5\' 9"'$  (175.3 cm) Weight: 111.7 kg (246 lb 4.1 oz) IBW/kg (Calculated) : 70.7 Temp (24hrs), Avg:97.8 F (36.6 C), Min:97.4 F (36.3 C), Max:98.3 F (36.8 C)  Recent Labs  Lab 11/15/22 0454 11/16/22 0029 11/16/22 0447 11/16/22 0950 11/16/22 2152  11/17/22 0238 11/18/22 0418 11/19/22 0402 11/19/22 1210 11/20/22 0400  WBC 8.3  --  9.6  --   --   --  12.6* 12.5*  --  12.5*  CREATININE 1.26*  --  1.18  --   --  1.25* 1.11 1.07  --  1.22  VANCOTROUGH  --   --   --  30*  --   --   --   --   --   --   VANCOPEAK  --  36  --   --   --   --   --   --   --   --   VANCORANDOM  --   --   --   --    < > 19 15  --  18 14   < > = values in this interval not displayed.     Estimated Creatinine Clearance: 80.3 mL/min (by C-G formula based on SCr of 1.22 mg/dL).    Allergies  Allergen Reactions   Ace Inhibitors Swelling and Cough    (Not on MAR at [facility])   Haloperidol And Related     Do NOT give anti-psychotics due to risk of  Torsades and QT prolongation (per Dr. Graciela Husbands request)   Other Itching    [name]   Shellfish Allergy    Penicillins Itching and Rash    Has patient had a PCN reaction causing immediate rash, facial/tongue/throat swelling, SOB or  lightheadedness with hypotension: Unknown Has patient had a PCN reaction causing severe rash involving mucus membranes or skin necrosis: Unknown Has patient had a PCN reaction that required hospitalization: Unknown Has patient had a PCN reaction occurring within the last 10 years: Unknown If all of the above answers are "NO", then may proceed with Cephalosporin use.     Antimicrobials this admission: 1/1 vancomycin >>  1/1 metronidazole >>1/8 1/1 ceftriaxone >>1/8  Microbiology results: 1/5 WCx MRSA (S = Vanco, SMZ/TMP)  Thank you for allowing pharmacy to be a part of this patient's care.  Doreene Eland, PharmD, BCPS, BCIDP Work Cell: [phone] 11/20/2022 8:40 AM

## 2022-11-20 NOTE — Progress Notes (Signed)
   Date of Admission:  11/11/2022    ID: Shane Chambers is a 62 y.o. male Principal Problem:   Acute osteomyelitis ([place]) Active Problems:   Type 2 diabetes mellitus (HCC)   Depression   QT prolongation   MRSA infection   Amputation stump infection ([place])    Subjective: Feeling better  Medications:   amLODipine  10 mg Oral Daily   ARIPiprazole  10 mg Oral Daily   vitamin C  500 mg Oral BID   aspirin EC  81 mg Oral Q breakfast   atorvastatin  80 mg Oral QHS   bisacodyl  10 mg Oral QHS   bisacodyl  10 mg Rectal Once   clotrimazole   Topical BID   enoxaparin (LOVENOX) injection  0.5 mg/kg Subcutaneous Q24H   ferrous gluconate  324 mg Oral Daily   fluticasone furoate-vilanterol  1 puff Inhalation Daily   gabapentin  600 mg Oral BID    HYDROmorphone (DILAUDID) injection  0.5 mg Intravenous STAT    HYDROmorphone (DILAUDID) injection  0.5 mg Intravenous BID   insulin aspart  0-15 Units Subcutaneous TID AC & HS   levothyroxine  75 mcg Oral Q0600   magnesium oxide  400 mg Oral Daily   metoprolol tartrate  50 mg Oral BID   mineral oil  1 enema Rectal Once   multivitamin with minerals  1 tablet Oral Daily   pantoprazole  40 mg Oral Daily   polyethylene glycol  17 g Oral BID   polyvinyl alcohol  1 drop Both Eyes Daily   potassium chloride SA  20 mEq Oral Daily   Ensure Max Protein  11 oz Oral TID   sodium chloride flush  3 mL Intravenous Q12H   tamsulosin  0.8 mg Oral Daily   thiamine  100 mg Oral Daily   torsemide  40 mg Oral Daily   venlafaxine XR  150 mg Oral Q breakfast   Vitamin D (Ergocalciferol)  50,000 Units Oral Q7 days   zinc sulfate  220 mg Oral Daily    Objective: Vital signs in last 24 hours: Temp:  [97.4 F (36.3 C)-98.3 F (36.8 C)] 97.4 F (36.3 C) (01/11 0801) Pulse Rate:  [54-63] 54 (01/11 0801) Resp:  [14-20] 18 (01/11 0801) BP: (125-133)/(56-64) 125/64 (01/11 0801) SpO2:  [93 %-98 %] 96 % (01/11 0801)    PHYSICAL EXAM:  General: Alert,  cooperative, no distress, appears stated age.  Lungs: Clear to auscultation bilaterally. No Wheezing or Rhonchi. No rales. Heart: Regular rate and rhythm, no murmur, rub or gallop. Abdomen: Soft, non-tender,not distended. Bowel sounds normal. No masses Extremities:  RT BKA      Still has some slough/yellow tissue  Skin: No rashes or lesions. Or bruising Lymph: Cervical, supraclavicular normal. Neurologic: Grossly non-focal  Lab Results Recent Labs    11/19/22 0402 11/20/22 0400  WBC 12.5* 12.5*  HGB 8.2* 7.9*  HCT 26.0* 25.3*  NA 136 135  K 4.3 4.4  CL 102 101  CO2 25 25  BUN 64* 68*  CREATININE 1.07 1.22   Microbiology: Stump culture MRSA    Assessment/Plan: MRSA wound infection of the Rt BKA stump- S/p debridement and wound vac placement-but now wound vac has been removed-  continue vanco-   DM on insulin   HTN on amlodipine, metoprolol and torsemide   Depression/Anxiety- on Venlafaxine and aripiprazole   Discussed the management with the patient

## 2022-11-21 ENCOUNTER — Inpatient Hospital Stay: Payer: Medicare Other

## 2022-11-21 LAB — SEDIMENTATION RATE: Sed Rate: 115 mm/hr — ABNORMAL HIGH (ref 0–20)

## 2022-11-21 LAB — BASIC METABOLIC PANEL
Anion gap: 8 (ref 5–15)
BUN: 78 mg/dL — ABNORMAL HIGH (ref 6–20)
CO2: 27 mmol/L (ref 22–32)
Calcium: 8.8 mg/dL — ABNORMAL LOW (ref 8.9–10.3)
Chloride: 99 mmol/L (ref 98–111)
Creatinine, Ser: 1.42 mg/dL — ABNORMAL HIGH (ref 0.61–1.24)
GFR, Estimated: 57 mL/min — ABNORMAL LOW (ref >60)
Glucose, Bld: 143 mg/dL — ABNORMAL HIGH (ref 70–99)
Potassium: 4.8 mmol/L (ref 3.5–5.1)
Sodium: 134 mmol/L — ABNORMAL LOW (ref 135–145)

## 2022-11-21 LAB — MAGNESIUM: Magnesium: 2.1 mg/dL (ref 1.7–2.4)

## 2022-11-21 LAB — CBC
HCT: 26.9 % — ABNORMAL LOW (ref 39.0–52.0)
Hemoglobin: 8.4 g/dL — ABNORMAL LOW (ref 13.0–17.0)
MCH: 29.2 pg (ref 26.0–34.0)
MCHC: 31.2 g/dL (ref 30.0–36.0)
MCV: 93.4 fL (ref 80.0–100.0)
Platelets: 346 10*3/uL (ref 150–400)
RBC: 2.88 MIL/uL — ABNORMAL LOW (ref 4.22–5.81)
RDW: 16.1 % — ABNORMAL HIGH (ref 11.5–15.5)
WBC: 15 10*3/uL — ABNORMAL HIGH (ref 4.0–10.5)
nRBC: 0 % (ref 0.0–0.2)

## 2022-11-21 LAB — PHOSPHORUS: Phosphorus: 5.2 mg/dL — ABNORMAL HIGH (ref 2.5–4.6)

## 2022-11-21 LAB — GLUCOSE, CAPILLARY
Glucose-Capillary: 146 mg/dL — ABNORMAL HIGH (ref 70–99)
Glucose-Capillary: 149 mg/dL — ABNORMAL HIGH (ref 70–99)
Glucose-Capillary: 155 mg/dL — ABNORMAL HIGH (ref 70–99)
Glucose-Capillary: 140 mg/dL — ABNORMAL HIGH (ref 70–99)

## 2022-11-21 LAB — C-REACTIVE PROTEIN: CRP: 0.7 mg/dL (ref <1.0)

## 2022-11-21 MED ORDER — POLYETHYLENE GLYCOL 3350 17 G PO PACK
17.0000 g | PACK | Freq: Two times a day (BID) | ORAL | Status: DC
  Administered 2022-11-22: 17 g via ORAL
  Filled 2022-11-21: qty 1

## 2022-11-21 MED ORDER — VANCOMYCIN VARIABLE DOSE PER UNSTABLE RENAL FUNCTION (PHARMACIST DOSING): Status: DC

## 2022-11-21 MED ORDER — SODIUM CHLORIDE 0.9 % IV BOLUS
500.0000 mL | Freq: Once | INTRAVENOUS | Status: AC
  Administered 2022-11-21: 500 mL via INTRAVENOUS

## 2022-11-21 MED ORDER — BISACODYL 5 MG PO TBEC: 10.0000 mg | DELAYED_RELEASE_TABLET | Freq: Every day | ORAL | Status: DC | PRN

## 2022-11-21 MED ORDER — BISACODYL 5 MG PO TBEC
10.0000 mg | DELAYED_RELEASE_TABLET | Freq: Two times a day (BID) | ORAL | Status: DC
  Administered 2022-11-21 – 2022-11-22 (×2): 10 mg via ORAL
  Filled 2022-11-21 (×4): qty 2

## 2022-11-21 MED ORDER — SODIUM CHLORIDE 0.9 % IV SOLN
8.0000 mg/kg | Freq: Every day | INTRAVENOUS | Status: DC
  Administered 2022-11-22 – 2022-11-27 (×6): 700 mg via INTRAVENOUS
  Filled 2022-11-21 (×8): qty 14

## 2022-11-21 MED ORDER — TORSEMIDE 20 MG PO TABS: 20.0000 mg | ORAL_TABLET | Freq: Every day | ORAL | Status: DC

## 2022-11-21 NOTE — Progress Notes (Signed)
Date of Admission:  11/11/2022    ID: Shane Chambers is a 62 y.o. male Principal Problem:   Acute osteomyelitis ([place]) Active Problems:   Type 2 diabetes mellitus (HCC)   Depression   QT prolongation   MRSA infection   Amputation stump infection ([place])    C/o diarrhea HE was constipated for 5 days and has been getting Miralax, docusate, senna Still think she is impacted Wants enema  Medications:   amLODipine  10 mg Oral Daily   ARIPiprazole  10 mg Oral Daily   vitamin C  500 mg Oral BID   aspirin EC  81 mg Oral Q breakfast   atorvastatin  80 mg Oral QHS   clotrimazole   Topical BID   enoxaparin (LOVENOX) injection  0.5 mg/kg Subcutaneous Q24H   ferrous gluconate  324 mg Oral Daily   fluticasone furoate-vilanterol  1 puff Inhalation Daily   gabapentin  600 mg Oral BID    HYDROmorphone (DILAUDID) injection  0.5 mg Intravenous BID   insulin aspart  0-15 Units Subcutaneous TID AC & HS   levothyroxine  75 mcg Oral Q0600   magnesium oxide  400 mg Oral Daily   metoprolol tartrate  50 mg Oral BID   multivitamin with minerals  1 tablet Oral Daily   pantoprazole  40 mg Oral Daily   [START ON 11/22/2022] polyethylene glycol  17 g Oral BID   polyvinyl alcohol  1 drop Both Eyes Daily   potassium chloride SA  20 mEq Oral Daily   Ensure Max Protein  11 oz Oral TID   sodium chloride flush  3 mL Intravenous Q12H   tamsulosin  0.8 mg Oral Daily   thiamine  100 mg Oral Daily   [START ON 11/24/2022] torsemide  20 mg Oral Daily   vancomycin variable dose per unstable renal function (pharmacist dosing)   Does not apply See admin instructions   venlafaxine XR  150 mg Oral Q breakfast   Vitamin D (Ergocalciferol)  50,000 Units Oral Q7 days   zinc sulfate  220 mg Oral Daily    Objective: Vital signs in last 24 hours: Temp:  [97.3 F (36.3 C)-98.2 F (36.8 C)] 98 F (36.7 C) (01/12 1551) Pulse Rate:  [58-65] 65 (01/12 1551) Resp:  [18-20] 20 (01/12 1551) BP: (127-135)/(59-64) 127/59  (01/12 1551) SpO2:  [96 %-98 %] 96 % (01/12 1551) Weight:  [111.6 kg] 111.6 kg (01/12 0446)    PHYSICAL EXAM:  General: Alert, cooperative, tired, .  Lungs: Clear to auscultation bilaterally. No Wheezing or Rhonchi. No rales. Heart: Regular rate and rhythm, no murmur, rub or gallop. Abdomen: Soft, non-tender,not distended. Bowel sounds normal. No masses Extremities:  RT BKA      Still has some slough/yellow tissue  Skin: No rashes or lesions. Or bruising Lymph: Cervical, supraclavicular normal. Neurologic: Grossly non-focal  Lab Results Recent Labs    11/20/22 0400 11/21/22 0420  WBC 12.5* 15.0*  HGB 7.9* 8.4*  HCT 25.3* 26.9*  NA 135 134*  K 4.4 4.8  CL 101 99  CO2 25 27  BUN 68* 78*  CREATININE 1.22 1.42*   Microbiology: Stump culture MRSA    Assessment/Plan: MRSA wound infection of the Rt BKA stump- S/p debridement and wound vac placement-but now wound vac has been removed-  On vanco , but because of worsening cr change to daptomycin Pt on atorvastatin '80mg'$  - watch closely while on dapto because of risk of rhabdo- may reduce statin to  $'40mg'G$    AKI- because of diarrhea, volume depeletion along with Torsemide Getting some IV fluids  DM on insulin   HTN on amlodipine, metoprolol and torsemide   Depression/Anxiety- on Venlafaxine and aripiprazole   Discussed the management with the patient and care team ID will follow him peripherally this weekend. [facility] available by phone for urgent issues

## 2022-11-21 NOTE — Progress Notes (Signed)
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 Pt is now s/p excisional debridement & drainage of abscess & application of wound vac on RLE residual limb on 11/14/22,  revision and closure of RLE residual limb 11/18/22. PMH significant for CAD s/p CABG, HFrEF with recovered EF, PVD, type 2 diabetes, COPD, hypertension, hyperlipidemia, hypothyroidism, depression   OT comments  Shane Chambers demonstrates good ability to roll in bed and to transition supine<>sit, moving smoothly and quickly through various in-bed positions today. He transitioned supine<sit to use urinal sitting EOB, no assistance required for repositioning or using urinal, although pt did have a small spill and needed help with clean up. Pt then has a large, loose BM in bed, requires Max A for clean up, Min A for changing gown, is IND in rolling side to side for cleaning and bedding change. Discussed importance of engaging in enjoyable activities. Pt states that he used to like to read but is unable to do so now, as he "cannot concentrate." After some thought, he is able to identify that he enjoys coloring w/ pencils and likes word puzzles, such as crosswords. Will attempt to provide pt with such supplies during an upcoming session. Pt left in bed, alarm activated, nursing aware. Discussed DC options, with pt continuing to state he strongly prefers to go somewhere other than [place] but understands that he may not have another option.   Recommendations for follow up therapy are one component of a multi-disciplinary discharge planning process, led by the attending physician.  Recommendations may be updated based on patient status, additional functional criteria and insurance  authorization.    Follow Up Recommendations  Skilled nursing-short term rehab (<3 hours/day)     Assistance Recommended at Discharge Frequent or constant Supervision/Assistance  Patient can return home with the following  Assistance with cooking/housework;Direct supervision/assist for medications management;Assist for transportation;Help with stairs or ramp for entrance;Direct supervision/assist for financial management;A lot of help with walking and/or transfers;A lot of help with bathing/dressing/bathroom   Equipment Recommendations       Recommendations for Other Services      Precautions / Restrictions Precautions Precautions: Fall Restrictions Weight Bearing Restrictions: Yes [name] Weight Bearing: Weight bearing as tolerated LUE Weight Bearing: Weight bearing as tolerated RLE Weight Bearing: Weight bearing as tolerated Other Position/Activity Restrictions: no activity/WB orders in chart, wound along R BKA incision, treated as NWBing today       Mobility Bed Mobility Overal bed mobility: Needs Assistance Bed Mobility: Supine to Sit, Sit to Supine, Rolling Rolling: Modified independent (Device/Increase time)   Supine to sit: Modified independent (Device/Increase time), HOB elevated Sit to supine: Modified independent (Device/Increase time), HOB elevated   General bed mobility comments: Pt able to roll side to side for pericare in bed; able to sit up EOB for using urinal. Assistance required for clean-up post voiding, but no assist for bed mobility    Transfers                         Balance Overall balance assessment: Needs assistance Sitting-balance support: Single extremity supported Sitting balance-Leahy Scale: Good Sitting balance - Comments: no LOB while seated EOB     Standing balance-Leahy Scale: Zero  ADL either performed or assessed with clinical judgement   ADL Overall ADL's : Needs  assistance/impaired Eating/Feeding: Modified independent;Sitting               Upper Body Dressing : Bed level;Supervision/safety       Toilet Transfer: Minimal assistance;Set up Toilet Transfer Details (indicate cue type and reason): set up for using urinal EOB, w/ pt spilling some urinal and requiring Min A for clean up Toileting- Clothing Manipulation and Hygiene: Maximal assistance;Bed level Toileting - Clothing Manipulation Details (indicate cue type and reason): Max A for pericare post BM in bed            Extremity/Trunk Assessment Upper Extremity Assessment Upper Extremity Assessment: Generalized weakness [name] Coordination: decreased fine motor;decreased gross motor   Lower Extremity Assessment Lower Extremity Assessment: Generalized weakness;RLE deficits/detail RLE Deficits / Details: s/p complication of R BKA site        Vision       Perception     Praxis      Cognition Arousal/Alertness: Awake/alert Behavior During Therapy: WFL for tasks assessed/performed Overall Cognitive Status: Within Functional Limits for tasks assessed                                          Exercises Other Exercises Other Exercises: Multiple rolling L/R; educ re: DC recs, importance of engaging in meaningful occupations    Shoulder Instructions       General Comments      Pertinent Vitals/ Pain       Pain Assessment Pain Score: 5  Pain Location: R residual limb Pain Descriptors / Indicators: Aching, Grimacing Pain Intervention(s): Repositioned  Home Living                                          Prior Functioning/Environment              Frequency  Min 2X/week        Progress Toward Goals  OT Goals(current goals can now be found in the care plan section)  Progress towards OT goals: Progressing toward goals  Acute Rehab OT Goals OT Goal Formulation: With patient Time For Goal Achievement: 11/26/22 Potential to  Achieve Goals: [place] Discharge plan remains appropriate;Frequency remains appropriate    Co-evaluation                 AM-PAC OT "6 Clicks" Daily Activity     Outcome Measure   Help from another person eating meals?: None Help from another person taking care of personal grooming?: A Little Help from another person toileting, which includes using toliet, bedpan, or urinal?: A Lot Help from another person bathing (including washing, rinsing, drying)?: A Lot Help from another person to put on and taking off regular upper body clothing?: A Little Help from another person to put on and taking off regular lower body clothing?: A Lot 6 Click Score: 16    End of Session    OT Visit Diagnosis: Unsteadiness on feet (R26.81);Muscle weakness (generalized) (M62.81);Pain   Activity Tolerance Patient tolerated treatment well   Patient Left in bed;with call bell/phone within reach;with bed alarm set   Nurse Communication Mobility status        Time: 5003-7048 OT Time Calculation (min): 18 min  Charges: OT General Charges $OT Visit: 1 Visit OT Treatments $Self Care/Home Management : 8-22 mins Josiah Lobo, PhD, MS, OTR/L 11/21/22, 2:43 PM

## 2022-11-21 NOTE — Progress Notes (Signed)
Pharmacy Antibiotic Note  Shane Chambers is a 62 y.o. male w/ PMH of BPH, DM, CAD, CABG, CHF, HTN, hypothyroidism, depression, BKA admitted on 11/11/2022 with  right below-knee amputation infection .  Pharmacy has been consulted for Daptomycin dosing. Previously on vancomycin but due to rising SCr changing to daptomyin   Today, 11/21/2022 Day 11 vancomycin for stump infection SCr 1.42 - Trending up BUN also increasing may be dehydrated with diarrhea and torsemide Torsemide dose dec by 50% 1/12 and 527m fluid bolus ordered per MD WBC 15.0 - up slightly (? Related to dehdration and hemoconcentration)  Afebrile OR culture from 1/5 with MRSA (susc to vanco, tmp/smz) Unexpected vancomycin kinetics based his SCr  Level information 1/11 random vancomycin level today at 0400 = 14 mcg/mL Calculated Ke = 0.0157 for half-life of 44 hours   1/10: random vancomycin level at 12:10 = 18 mcg/ml, vancomycin '1750mg'$  IV q48h (1st dose) given 1/9 at 12:14  1/7 and 1/8: Levels were drawn at steady-state on 750 mg IV vancomycin every 12 hours (last dose prior to levels was 1/6 at 22:21)resulting in the following pharmacokinetic parameters: vanc peak was 36 mcg/ml (1/7 @ 00:29) and trough = 30 mcg/mL 1/7 @ 09:50, this does not appear to be lab error as a random level at 21:52 1/7 = 21 mcg/mL  Ke: 0.0195 h-1 T1/2: 35.5 h  Plan:  Change to Daptomycin '8mg'$ /kg per adjusted BW ('700mg'$ ) IV q24h First dose scheduled fro 1/13 at 16:00 based on next vancomycin dose being due 1/13 at noon and worsening renal function Will continue with order for vancomycin random with morning labs to assist with daptomycin start time  Check CK in am then weekly - note on atorvastatin '80mg'$  (may recheck first follow-up CK sooner) Last dose of vancomycin 1500 mg 01/12 1220 Follow renal function for adjustments Informed TOC that d/c on Daptomycin is possible (plan is SNF)  Height: '5\' 9"'$  (175.3 cm) Weight: 111.6 kg (246 lb 0.5 oz) IBW/kg  (Calculated) : 70.7 Temp (24hrs), Avg:97.7 F (36.5 C), Min:97.3 F (36.3 C), Max:98.2 F (36.8 C)  Recent Labs  Lab 11/16/22 0029 11/16/22 0447 11/16/22 0950 11/16/22 2152 11/17/22 0238 11/18/22 0418 11/19/22 0402 11/19/22 1210 11/20/22 0400 11/21/22 0420  WBC  --  9.6  --   --   --  12.6* 12.5*  --  12.5* 15.0*  CREATININE  --  1.18  --   --  1.25* 1.11 1.07  --  1.22 1.42*  VANCOTROUGH  --   --  30*  --   --   --   --   --   --   --   VANCOPEAK 36  --   --   --   --   --   --   --   --   --   VANCORANDOM  --   --   --    < > 19 15  --  18 14  --    < > = values in this interval not displayed.     Estimated Creatinine Clearance: 69 mL/min (A) (by C-G formula based on SCr of 1.42 mg/dL (H)).    Allergies  Allergen Reactions   Ace Inhibitors Swelling and Cough    [facility]   Haloperidol And Related     Do NOT give anti-psychotics due to risk of  Torsades and QT prolongation (per Dr. EGraciela Husbandsrequest)   Other Itching  [name]   Shellfish Allergy    Penicillins Itching and Rash    Has patient had a PCN reaction causing immediate rash, facial/tongue/throat swelling, SOB or lightheadedness with hypotension: Unknown Has patient had a PCN reaction causing severe rash involving mucus membranes or skin necrosis: Unknown Has patient had a PCN reaction that required hospitalization: Unknown Has patient had a PCN reaction occurring within the last 10 years: Unknown If all of the above answers are "NO", then may proceed with Cephalosporin use.     Antimicrobials this admission: 1/13 daptomycin >> 1/1 vancomycin >> 1/12 1/1 metronidazole >>1/8 1/1 ceftriaxone >>1/8  Microbiology results: 1/5 WCx MRSA (S = Vanco, SMZ/TMP)  Thank you for allowing pharmacy to be a part of this patient's care.  Doreene Eland, PharmD, BCPS, BCIDP Work Cell: [phone] 11/21/2022 4:01 PM

## 2022-11-21 NOTE — Progress Notes (Signed)
PT Cancellation Note  Patient Details Name: Shane Chambers MRN: [id number] DOB: 1961-05-31   Cancelled Treatment:    Reason Eval/Treat Not Completed: Patient declined, no reason specified. Upon entry, pt politely declining PT this a.m. as he is having episodes of loose stools. Agreeable to afternoon session to see if his stools improve. PT to re-attempt this afternoon as able.    Salem Caster. Fairly IV, PT, DPT Physical Therapist- [facility]  11/21/2022, 10:59 AM

## 2022-11-21 NOTE — Plan of Care (Signed)
  Problem: Coping: Goal: Ability to adjust to condition or change in health will improve Outcome: Progressing   Problem: Fluid Volume: Goal: Ability to maintain a balanced intake and output will improve Outcome: Progressing   Problem: Health Behavior/Discharge Planning: Goal: Ability to identify and utilize available resources and services will improve Outcome: Progressing

## 2022-11-21 NOTE — Progress Notes (Signed)
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 Pt is now s/p excisional debridement & drainage of abscess & application of wound vac on RLE residual limb on 11/14/22,  revision and closure of RLE residual limb 11/18/22. PMH significant for CAD s/p CABG, HFrEF with recovered EF, PVD, type 2 diabetes, COPD, hypertension, hyperlipidemia, hypothyroidism, depression    PT Comments    Pt received upright in bed agreeable to PT. Reports fatigue, appearing lethargic having difficulty keeping eyes open but motivated to participate. Deferring standing this date due to fatigue with focus on lateral scoot transfer to the R towards amputated side. Pt transfers to EOB mod-I with bed features. PT allowing pt to problem solve and attempt R scoot transfer to recliner. Pt attempting to place His R limb on the recliner and scoot long sitting while pushing through his LLE on floor reaching forwards with [name] to pull on arm rest. At this point PT stopping pt to educate on alternate technique. Pt relying on mod to max multimodal cuing and step by step processing for improved carryover. Bed slightly elevated to assist in transfer to recliner with vastly greater success at minguard level to transfer to recliner with min VC's for head hips relationship and pushing through UE's to clear buttocks. Once in recliner LE's inclined with R knee extended with re-education on R BKA positioning to prevent contracture. Pt with all needs in reach with d/c recs remaining appropriate.     Recommendations for follow up therapy are one component of a multi-disciplinary discharge planning process, led by the attending physician.  Recommendations may be updated based on patient status,  additional functional criteria and insurance authorization.  Follow Up Recommendations  Skilled nursing-short term rehab (<3 hours/day) Can patient physically be transported by private vehicle: No   Assistance Recommended at Discharge Frequent or constant Supervision/Assistance  Patient can return home with the following Direct supervision/assist for medications management;Assist for transportation;Help with stairs or ramp for entrance;A little help with walking and/or transfers;A little help with bathing/dressing/bathroom   Equipment Recommendations  Other (comment) (TBD by next venue of care)    Recommendations for Other Services       Precautions / Restrictions Precautions Precautions: Fall Restrictions Weight Bearing Restrictions: Yes [name] Weight Bearing: Weight bearing as tolerated LUE Weight Bearing: Weight bearing as tolerated LLE Weight Bearing: Weight bearing as tolerated Other Position/Activity Restrictions: no activity/WB orders in chart, wound along R BKA incision, treated as NWBing today     Mobility  Bed Mobility Overal bed mobility: Needs Assistance Bed Mobility: Supine to Sit     Supine to sit: Modified independent (Device/Increase time), HOB elevated       Patient Response: Cooperative  Transfers Overall transfer level: Needs assistance Equipment used: Rolling walker (2 wheels) Transfers: Bed to chair/wheelchair/BSC            Lateral/Scoot Transfers: Mod assist General transfer comment: lateral scoot to R side towards R BKA. Needing mod to max multimodal cuing and re-direction/education for safe completion    Ambulation/Gait               General Gait Details: unable/unsafe to attempt   Chief Strategy Officer  Modified Rankin (Stroke Patients Only)       Balance Overall balance assessment: Needs assistance Sitting-balance support: Single extremity supported Sitting balance-Leahy Scale: Good          Standing balance comment: Did not stand this date due to fatigue.                            Cognition Arousal/Alertness: Awake/alert Behavior During Therapy: WFL for tasks assessed/performed Overall Cognitive Status: Within Functional Limits for tasks assessed                                 General Comments: motivated and cooperative. Does demonstrate fatigue due to diarrhea and just completing OT.        Exercises Other Exercises Other Exercises: Re-educated on R BKA positioning to prevent contracture.    General Comments        Pertinent Vitals/Pain Pain Assessment Pain Assessment: Faces Faces Pain Scale: No hurt    Home Living                          Prior Function            PT Goals (current goals can now be found in the care plan section) Acute Rehab PT Goals Patient Stated Goal: get stronger PT Goal Formulation: With patient Time For Goal Achievement: 11/29/22 Potential to Achieve Goals: Fair Additional Goals Additional Goal #1: Pt will propel w/c x 150 ft with mod I to increase independence with functional mobility. Progress towards PT goals: Progressing toward goals    Frequency    7X/week      PT Plan Current plan remains appropriate    Co-evaluation              AM-PAC PT "6 Clicks" Mobility   Outcome Measure  Help needed turning from your back to your side while in a flat bed without using bedrails?: None Help needed moving from lying on your back to sitting on the side of a flat bed without using bedrails?: A Little Help needed moving to and from a bed to a chair (including a wheelchair)?: A Little Help needed standing up from a chair using your arms (e.g., wheelchair or bedside chair)?: Total Help needed to walk in hospital room?: Total Help needed climbing 3-5 steps with a railing? : Total 6 Click Score: 13    End of Session Equipment Utilized During Treatment: Gait belt Activity  Tolerance: Patient tolerated treatment well;Patient limited by fatigue Patient left: in chair;with chair alarm set;with call bell/phone within reach Nurse Communication: Mobility status PT Visit Diagnosis: Muscle weakness (generalized) (M62.81);Difficulty in walking, not elsewhere classified (R26.2);Other abnormalities of gait and mobility (R26.89)     Time: 1437-1500 PT Time Calculation (min) (ACUTE ONLY): 23 min  Charges:  $Therapeutic Activity: 23-37 mins                     Gar Glance M. Fairly IV, PT, DPT Physical Therapist- [facility]  11/21/2022, 3:56 PM

## 2022-11-21 NOTE — TOC Progression Note (Signed)
Transition of Care [facility]) - Progression Note    Patient Details  Name: Shane Chambers MRN: [id number] Date of Birth: 02-Jun-1961  [facility] CM/SW [place], LCSW Phone Number: 11/21/2022, 10:59 AM  Clinical Narrative:    CSW reached out to [place] with [facility] and [place] with [facility] - both reported they are not able to make a bed offer.  Patient's only bed offer is to return to [facility]. MD updated.        Expected Discharge Plan and Services                                               Social Determinants of Health (SDOH) Interventions SDOH Screenings   Food Insecurity: No Food Insecurity (11/13/2022)  Housing: Low Risk  (11/13/2022)  Transportation Needs: No Transportation Needs (11/13/2022)  Utilities: Not At Risk (11/13/2022)  Alcohol Screen: Low Risk  (11/05/2022)  Tobacco Use: Medium Risk (11/20/2022)    Readmission Risk Interventions    11/11/2022    2:05 PM 10/03/2022   10:08 AM 09/26/2022    1:16 PM  Readmission Risk Prevention Plan  Transportation Screening Complete Complete Complete  PCP or Specialist Appt within 3-5 Days   Complete  HRI or [place]   Complete  Social Work Consult for [place] Planning/Counseling   Complete  Palliative Care Screening   Not Applicable  Medication Review Press photographer)   Complete  HRI or Home Care Consult Complete Complete   SW Recovery Care/Counseling Consult Complete Complete   Palliative Care Screening Not Applicable Not Applicable   Skilled Nursing Facility Complete Complete

## 2022-11-21 NOTE — Progress Notes (Signed)
[facility] Progress Note  Patient: Shane Chambers    EGB:[id number]  DOA: 11/11/2022     Date of Service: the patient was seen and examined on 11/21/2022  No chief complaint on file.  Brief hospital course:  CONNELL BOGNAR is a 62 y.o. male with past medical history of CAD s/p CABG, HFrEF with recovered EF, PVD, type 2 diabetes, COPD, hypertension, hyperlipidemia, hypothyroidism, depression who is current he admitted for suicidal ideation and found to have complication of his amputation site.  Mr. Smolen states that for the last 3 days, he has been experiencing increasing pain of his right BKA stump in addition to increasing erythema.  His nurse at bedside states that he has been picking at it. Patient has not noticed any purulent drainage at this time but there are areas where he is worried that the incision is opening. Per chart review, patient was admitted on 11/23 for cellulitis of the right lower extremity extending from the foot up towards the knee.  CT imaging at that time demonstrated holdable complex fluid collections and possible medial malleolus osteomyelitis.  MRI was subsequently obtained that demonstrated right ankle septic arthritis.  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  He followed up with orthopedic surgery on 12/14.  At that time, he was instructed to follow-up in 1 week for staple removal.   Assessment and Plan:  Right BKA stump abscess, s/p revision and washout done on 11/14/2022 H/o Acute osteomyelitis, s/p right BKA Initially patient was seen in consultation at [facility], however required transfer to the medical unit due to inability to administer IV antibiotics.  Patient presented with 3 days of gradually worsening right stump erythema and pain.  Initial concern for cellulitis or only, however vascular surgery was able to put a probe down to the bone with 200 cc of purulent drainage, more consistent with osteomyelitis. Inflammatory markers are elevated. Vascular  surgery consulted, s/p revision of right BKA stump, washout of Abscess in the posterior portion of the below-knee amputation stump, Deep wound culture to microbiology  S/p ceftriaxone and metronidazole, which were discontinued by ID Continue vancomycin IV   1/8 wound culture growing MRSA, discontinued ceftriaxone.  Wound culture for 5 days Tylenol as needed for fever Started gabapentin, patient was taking 6000 mg p.o. twice daily changed oxycodone 5 mg p.o. every 4 hourly as needed WBC count trending down Wound care consulted 1/8 ID consulted for recommendation of antibiotics and duration of treatment  1/9 s/p Excisional debridement right below-knee amputation stump  1/10 seen by vascular surgery, recommended Dressing changed today.  Wet-to-dry's with gauze packing the wound covered with ABDs and wrapped in Kerlix.  Follow vascular surgery and ID for discharge planning    Constipation Continue MiraLAX twice daily, Dulcolax 10 mg p.o. twice daily transition to as needed once constipation resolves. 1/12, x-ray abdomen shows moderate stool burden throughout the colon Patient was requesting enema, as per RN patient had multiple bowel movements but x-ray still shows moderate stool burden.  So we will go ahead and give soapsuds enema today.  Elevated creatinine could be due to dehydration Continue to monitor renal.,  Patient is on vancomycin, pharmacy will adjust dose according to renal functions Started gentle hydration for few hours Hold off torsemide home dose 40 mg p.o. daily over the weekend, and decreased torsemide 20 mg p.o. daily to be started from Monday.   Hypomagnesemia, mag repleted.  Resolved Monitor and replete as needed.  QT prolongation Patient has a history  of QTc prolongation with last QTc of 497.  EKG today with improved QTc.    Hypertension, continue amlodipine, Lopressor HLD, continue statin  Depression - Continue SSRIs and antipsychotics started on recent [facility]  admission  Continue Abilify and Effexor  Type 2 diabetes mellitus (HCC) - SSI, moderate - Hold home metformin  Hypothyroid, continued Synthroid BPH, continue Flomax Vitamin D insufficiency: started vitamin D 50,000 units p.o. weekly, follow with PCP to repeat vitamin D level after 3 to 6 months. 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  Recommended to follow with neurology for EMG and NCS test as an outpatient  BMI 36     Pressure Injury 11/06/22 Buttocks Right Stage 2 -  Partial thickness loss of dermis presenting as a shallow open injury with a red, pink wound bed without slough. no odor. no drainage. no slough. 1cm X 0.3cm (Active)  11/06/22 1800  Location: Buttocks  Location Orientation: Right  Staging: Stage 2 -  Partial thickness loss of dermis presenting as a shallow open injury with a red, pink wound bed without slough.  Wound Description (Comments): no odor. no drainage. no slough. 1cm X 0.3cm  Present on Admission:   Dressing Type None 11/18/22 0820     Diet: Heart healthy/carb modified DVT Prophylaxis: Subcutaneous Lovenox   Advance goals of care discussion: Full code  Family Communication: family was NOT present at bedside, at the time of interview.  The pt provided permission to discuss medical plan with the family. Opportunity was given to ask question and all questions were answered satisfactorily.   Disposition:  Pt is from SNF, admitted with right BKA infection and depression with SI/HI ideations under BHU, still has infection, on IV antibiotics, s/p right BKA stump washout done on 1/5, second washout on 1/9 , wound culture growing MRSA, ID consulted for further recommendation, which precludes a safe discharge. Discharge to SNF, when cleared by  vascular surgery and ID 1/11 as per [facility] patient is refusing to go back to SNF from where he came and has no offers from other SNF facilities.   Subjective: No significant events overnight, patient is having liquidy stools but he does not feel that he is moving bowels and feels that he is impacted and needs enema, abdominal pain is off and on, no nausea vomiting.  Patient denies any chest pain or palpitation.  Leg pain is 5/10.   Physical Exam: General:  alert oriented to time, place, and person.  Appear in no distress, affect appropriate Eyes: PERRLA ENT: Oral Mucosa Clear, moist  Neck: no JVD,  Cardiovascular: S1 and S2 Present, no Murmur,  Respiratory: good respiratory effort, Bilateral Air entry equal and Decreased, no Crackles, no wheezes Abdomen: Bowel Sound present, Soft and no tenderness,  Skin: no rashes Extremities: no Pedal edema, no calf tenderness, s/p Right BKA stump washout, dressing CDI,  Neurologic: without any new focal findings Gait not checked due to patient safety concerns  Vitals:   11/21/22 0326 11/21/22 0446 11/21/22 0817 11/21/22 1551  BP: (!) 132/59  (!) 135/59 (!) 127/59  Pulse: 63  62 65  Resp: '20  18 20  '$ Temp: 97.9 F (36.6 C)  98.2 F (36.8 C) 98 F (36.7 C)  TempSrc:      SpO2: 96%  98%  96%  Weight:  111.6 kg    Height:        Intake/Output Summary (Last 24 hours) at 11/21/2022 1854 Last data filed at 11/21/2022 1800 Gross per 24 hour  Intake 245.53 ml  Output 850 ml  Net -604.47 ml    Filed Weights   11/18/22 0500 11/18/22 1438 11/21/22 0446  Weight: 111.7 kg 111.7 kg 111.6 kg    Data Reviewed: I have personally reviewed and interpreted daily labs, tele strips, imagings as discussed above. I reviewed all nursing notes, pharmacy notes, vitals, pertinent old records I have discussed plan of care as described above with RN and patient/family.  CBC: Recent Labs  Lab 11/16/22 0447 11/18/22 0418 11/19/22 0402 11/20/22 0400  11/21/22 0420  WBC 9.6 12.6* 12.5* 12.5* 15.0*  HGB 7.9* 8.5* 8.2* 7.9* 8.4*  HCT 24.5* 26.6* 26.0* 25.3* 26.9*  MCV 90.7 91.4 90.9 92.3 93.4  PLT 323 362 350 335 027   Basic Metabolic Panel: Recent Labs  Lab 11/16/22 0447 11/17/22 0238 11/18/22 0418 11/19/22 0402 11/20/22 0400 11/21/22 0420  NA 137  --  136 136 135 134*  K 4.1  --  4.1 4.3 4.4 4.8  CL 102  --  101 102 101 99  CO2 25  --  '26 25 25 27  '$ GLUCOSE 132*  --  158* 158* 142* 143*  BUN 63*  --  64* 64* 68* 78*  CREATININE 1.18 1.25* 1.11 1.07 1.22 1.42*  CALCIUM 8.8*  --  8.9 8.8* 8.7* 8.8*  MG  --   --  1.8 1.9 2.0 2.1  PHOS  --   --  3.3 4.4 5.1* 5.2*    Studies: DG Abd 1 View  Result Date: 11/21/2022 CLINICAL DATA:  Lower abdominal pain. Constipation. Concern for impaction. EXAM: ABDOMEN - 1 VIEW COMPARISON:  One-view abdomen 10/10/2022 FINDINGS: The heart is enlarged. Mild airspace opacity at the right base likely reflects atelectasis. Moderate stool is present throughout the colon. The rectum is not dilated. Small bowel gas pattern is within normal limits. Vascular calcifications are noted. IMPRESSION: 1. Moderate stool throughout the colon without evidence for obstruction. 2. Cardiomegaly without failure. Electronically Signed   By: San Morelle M.D.   On: 11/21/2022 17:24    Scheduled Meds:  amLODipine  10 mg Oral Daily   ARIPiprazole  10 mg Oral Daily   vitamin C  500 mg Oral BID   aspirin EC  81 mg Oral Q breakfast   atorvastatin  80 mg Oral QHS   bisacodyl  10 mg Oral BID   clotrimazole   Topical BID   enoxaparin (LOVENOX) injection  0.5 mg/kg Subcutaneous Q24H   ferrous gluconate  324 mg Oral Daily   fluticasone furoate-vilanterol  1 puff Inhalation Daily   gabapentin  600 mg Oral BID    HYDROmorphone (DILAUDID) injection  0.5 mg Intravenous BID   insulin aspart  0-15 Units Subcutaneous TID AC & HS   levothyroxine  75 mcg Oral Q0600   magnesium oxide  400 mg Oral Daily   metoprolol tartrate   50 mg Oral BID   multivitamin with minerals  1 tablet Oral Daily   pantoprazole  40 mg Oral Daily   [START ON 11/22/2022] polyethylene glycol  17 g Oral BID   polyvinyl alcohol  1 drop Both Eyes Daily   potassium chloride SA  20 mEq Oral Daily   Ensure Max Protein  11 oz Oral TID   sodium chloride flush  3 mL Intravenous Q12H   tamsulosin  0.8 mg Oral Daily   thiamine  100 mg Oral Daily   [START ON 11/24/2022] torsemide  20 mg Oral Daily   vancomycin variable dose per unstable renal function (pharmacist dosing)   Does not apply See admin instructions   venlafaxine XR  150 mg Oral Q breakfast   Vitamin D (Ergocalciferol)  50,000 Units Oral Q7 days   zinc sulfate  220 mg Oral Daily   Continuous Infusions:  [START ON 11/22/2022] DAPTOmycin (CUBICIN) 700 mg in sodium chloride 0.9 % IVPB     PRN Meds: acetaminophen, albuterol, fentaNYL (SUBLIMAZE) injection, hydrALAZINE, ipratropium-albuterol, loperamide HCl, ondansetron (ZOFRAN) IV, mouth rinse, oxyCODONE  Time spent: 50 minutes  Author: Val Riles. MD [facility] 11/21/2022 6:54 PM  To reach On-call, see care teams to locate the attending and reach out to them via [email or web address]. If 7PM-7AM, please contact night-coverage If you still have difficulty reaching the attending provider, please page the [facility] (Director on Call) for [facility] on amion for assistance.

## 2022-11-21 NOTE — Progress Notes (Signed)
Pharmacy Antibiotic Note  Shane Chambers is a 62 y.o. male w/ PMH of BPH, DM, CAD, CABG, CHF, HTN, hypothyroidism, depression, BKA admitted on 11/11/2022 with  right below-knee amputation infection .  Pharmacy has been consulted for vancomycin dosing.  Today, 11/21/2022 Day 11 vancomycin for stump infection SCr 1.42 - ? Trend up from 1/10 WBC 15.0 - up slightly Afebrile OR culture from 1/5 with MRSA (susc to vanco, tmp/smz) Unexpected vancomycin kinetics based his SCr  Level information 1/11 random vancomycin level today at 0400 = 14 mcg/mL Calculated Ke = 0.0157 for half-life of 44 hours   1/10: random vancomycin level at 12:10 = 18 mcg/ml, vancomycin '1750mg'$  IV q48h (1st dose) given 1/9 at 12:14  1/7 and 1/8: Levels were drawn at steady-state on 750 mg IV vancomycin every 12 hours (last dose prior to levels was 1/6 at 22:21)resulting in the following pharmacokinetic parameters: vanc peak was 36 mcg/ml (1/7 @ 00:29) and trough = 30 mcg/mL 1/7 @ 09:50, this does not appear to be lab error as a random level at 21:52 1/7 = 21 mcg/mL  Ke: 0.0195 h-1 T1/2: 35.5 h  Plan:  Patient had approximately 10% increase in serum creatine compared to yesterday, approximately 30% increase in 2 days  Last dose of vancomycin 1500 mg 01/12 1220 Follow levels and renal function for adjustments: variable vancomycin dose placed    Height: '5\' 9"'$  (175.3 cm) Weight: 111.6 kg (246 lb 0.5 oz) IBW/kg (Calculated) : 70.7 Temp (24hrs), Avg:97.5 F (36.4 C), Min:97.3 F (36.3 C), Max:97.9 F (36.6 C)  Recent Labs  Lab 11/16/22 0029 11/16/22 0447 11/16/22 0950 11/16/22 2152 11/17/22 0238 11/18/22 0418 11/19/22 0402 11/19/22 1210 11/20/22 0400 11/21/22 0420  WBC  --  9.6  --   --   --  12.6* 12.5*  --  12.5* 15.0*  CREATININE  --  1.18  --   --  1.25* 1.11 1.07  --  1.22 1.42*  VANCOTROUGH  --   --  30*  --   --   --   --   --   --   --   VANCOPEAK 36  --   --   --   --   --   --   --   --   --    VANCORANDOM  --   --   --    < > 19 15  --  18 14  --    < > = values in this interval not displayed.     Estimated Creatinine Clearance: 69 mL/min (A) (by C-G formula based on SCr of 1.42 mg/dL (H)).    Allergies  Allergen Reactions   Ace Inhibitors Swelling and Cough    (Not on MAR at [facility])   Haloperidol And Related     Do NOT give anti-psychotics due to risk of  Torsades and QT prolongation (per Dr. Graciela Husbands request)   Other Itching    [name]   Shellfish Allergy    Penicillins Itching and Rash    Has patient had a PCN reaction causing immediate rash, facial/tongue/throat swelling, SOB or lightheadedness with hypotension: Unknown Has patient had a PCN reaction causing severe rash involving mucus membranes or skin necrosis: Unknown Has patient had a PCN reaction that required hospitalization: Unknown Has patient had a PCN reaction occurring within the last 10 years: Unknown If all of the above answers are "NO", then may proceed with Cephalosporin use.  Antimicrobials this admission: 1/1 vancomycin >>  1/1 metronidazole >>1/8 1/1 ceftriaxone >>1/8  Microbiology results: 1/5 WCx MRSA (S = Vanco, SMZ/TMP)  Thank you for allowing pharmacy to be a part of this patient's care.  Vallery Sa, PharmD, BCPS 11/21/2022 7:05 AM

## 2022-11-21 NOTE — Progress Notes (Signed)
Attempted to change pt wound. Pt refused stating that the surgeon usually changes his dressing and he wanted to wait for the surgeon. Pt education provided. Pt continued to refuse

## 2022-11-22 LAB — GLUCOSE, CAPILLARY
Glucose-Capillary: 125 mg/dL — ABNORMAL HIGH (ref 70–99)
Glucose-Capillary: 177 mg/dL — ABNORMAL HIGH (ref 70–99)
Glucose-Capillary: 153 mg/dL — ABNORMAL HIGH (ref 70–99)
Glucose-Capillary: 128 mg/dL — ABNORMAL HIGH (ref 70–99)

## 2022-11-22 LAB — CBC
HCT: 24.5 % — ABNORMAL LOW (ref 39.0–52.0)
Hemoglobin: 7.7 g/dL — ABNORMAL LOW (ref 13.0–17.0)
MCH: 29.1 pg (ref 26.0–34.0)
MCHC: 31.4 g/dL (ref 30.0–36.0)
MCV: 92.5 fL (ref 80.0–100.0)
Platelets: 308 10*3/uL (ref 150–400)
RBC: 2.65 MIL/uL — ABNORMAL LOW (ref 4.22–5.81)
RDW: 16.3 % — ABNORMAL HIGH (ref 11.5–15.5)
WBC: 10.6 10*3/uL — ABNORMAL HIGH (ref 4.0–10.5)
nRBC: 0 % (ref 0.0–0.2)

## 2022-11-22 LAB — BASIC METABOLIC PANEL
Anion gap: 10 (ref 5–15)
BUN: 82 mg/dL — ABNORMAL HIGH (ref 6–20)
CO2: 24 mmol/L (ref 22–32)
Calcium: 9.1 mg/dL (ref 8.9–10.3)
Chloride: 104 mmol/L (ref 98–111)
Creatinine, Ser: 1.32 mg/dL — ABNORMAL HIGH (ref 0.61–1.24)
GFR, Estimated: >60 mL/min (ref >60)
Glucose, Bld: 114 mg/dL — ABNORMAL HIGH (ref 70–99)
Potassium: 4.3 mmol/L (ref 3.5–5.1)
Sodium: 138 mmol/L (ref 135–145)

## 2022-11-22 LAB — CK: Total CK: 34 U/L — ABNORMAL LOW (ref 49–397)

## 2022-11-22 LAB — VANCOMYCIN, RANDOM: Vancomycin Rm: 14 ug/mL

## 2022-11-22 NOTE — Progress Notes (Signed)
Progress Note   Patient: Shane Chambers GOT:[id number] DOB: 1960/11/12 DOA: 11/11/2022     11 DOS: the patient was seen and examined on 11/22/2022   Brief hospital course:   Shane Chambers is a 62 y.o. male with past medical history of CAD s/p CABG, HFrEF with recovered EF, PVD, type 2 diabetes, COPD, hypertension, hyperlipidemia, hypothyroidism, depression who is current he admitted for suicidal ideation and found to have complication of his amputation site.  Shane Chambers states that for the last 3 days, he has been experiencing increasing pain of his right BKA stump in addition to increasing erythema.  His nurse at bedside states that he has been picking at it. Patient has not noticed any purulent drainage at this time but there are areas where he is worried that the incision is opening. Per chart review, patient was admitted on 11/23 for cellulitis of the right lower extremity extending from the foot up towards the knee.  CT imaging at that time demonstrated holdable complex fluid collections and possible medial malleolus osteomyelitis.  MRI was subsequently obtained that demonstrated right ankle septic arthritis.  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  He followed up with orthopedic surgery on 12/14.  At that time, he was instructed to follow-up in 1 week for staple removal.   Assessment and Plan:   Right BKA stump abscess/MRSA wound infection, s/p revision and washout done on 11/14/2022 H/o Acute osteomyelitis, s/p right BKA Initially patient was seen in consultation at [facility], however required transfer to the medical unit due to inability to administer IV antibiotics.  Patient presented with 3 days of gradually worsening right stump erythema and pain.  Initial concern for cellulitis or only, however vascular surgery was able to put a probe down to the bone with 200 cc of purulent drainage, more consistent with osteomyelitis. Inflammatory markers are elevated. Vascular surgery consulted,  s/p revision of right BKA stump, washout of Abscess in the posterior portion of the below-knee amputation stump, Deep wound culture to microbiology  S/p ceftriaxone and metronidazole, which were discontinued by ID ID changed antibiotic to IV daptomycin 1/8 wound culture growing MRSA, discontinued ceftriaxone.  Wound culture for 5 days Tylenol as needed for fever Continue gabapentin 600 mg p.o. twice daily Continue oxycodone 5 mg p.o. every 4 hourly as needed 1/8 ID consulted for recommendation of antibiotics and duration of treatment  1/9 s/p Excisional debridement right below-knee amputation stump  1/10 seen by vascular surgery, dressing changes per vascular.  Wet-to-dry's with gauze packing the wound covered with ABDs and wrapped in Kerlix.  Follow vascular surgery and ID for discharge planning     Constipation Continue MiraLAX twice daily, Dulcolax 10 mg p.o. twice daily transition to as needed once constipation resolves. 1/12, x-ray abdomen shows moderate stool burden throughout the colon Patient was requesting enema, as per RN patient had multiple bowel movements but x-ray still shows moderate stool burden.  So we will go ahead and give soapsuds enema today. 1/13: Patient had a large bowel movement this morning   Elevated creatinine could be due to dehydration Continue to monitor renal.,  Patient was on vancomycin but due to elevated creatinine changed antibiotic to IV daptomycin per ID Started gentle hydration for few hours Hold off torsemide home dose 40 mg p.o. daily over the weekend, and decreased torsemide 20 mg p.o. daily to be started from Monday.  Lab Results  Component Value Date   CREATININE 1.32 (H) 11/22/2022   CREATININE 1.42 (H)  11/21/2022   CREATININE 1.22 11/20/2022      Hypomagnesemia, repleted and resolved   QT prolongation Patient has a history of QTc prolongation with last QTc of 497.  EKG on 1/12 with improved QTc.    Hypertension, continue amlodipine,  Lopressor  HLD, continue statin, monitor CK while on daptomycin and statin   Depression - Continue SSRIs and antipsychotics started on recent [facility] admission  Continue Abilify and Effexor   Type 2 diabetes mellitus (HCC) - SSI, moderate - Hold home metformin   Hypothyroid, continued Synthroid BPH, continue Flomax Vitamin D insufficiency: started vitamin D 50,000 units p.o. weekly, follow with PCP to repeat vitamin D level after 3 to 6 months. 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  Recommended to follow with neurology for EMG and NCS test as an outpatient   BMI 36           Pressure Injury 11/06/22 Buttocks Right Stage 2 -  Partial thickness loss of dermis presenting as a shallow open injury with a red, pink wound bed without slough. no odor. no drainage. no slough. 1cm X 0.3cm (Active)  11/06/22 1800  Location: Buttocks  Location Orientation: Right  Staging: Stage 2 -  Partial thickness loss of dermis presenting as a shallow open injury with a red, pink wound bed without slough.  Wound Description (Comments): no odor. no drainage. no slough. 1cm X 0.3cm  Present on Admission:   Dressing Type None 11/18/22 0820     Subjective: He had a large bowel movement.  Motivated to work with therapy  Physical Exam: Vitals:   11/21/22 1551 11/21/22 1934 11/22/22 0500 11/22/22 0536  BP: (!) 127/59 (!) 133/59  (!) 141/61  Pulse: 65 61  61  Resp: '20 18  19  '$ Temp: 98 F (36.7 C) 98.3 F (36.8 C)  98.2 F (36.8 C)  TempSrc:      SpO2: 96% 96%  97%  Weight:   111.6 kg   Height:       Physical Exam: General:  alert oriented to time, place, and person.  Appear in no distress, affect appropriate Eyes: PERRLA ENT: Oral Mucosa Clear, moist  Neck: no JVD,   Cardiovascular: S1 and S2 Present, no Murmur Respiratory: good respiratory effort, Bilateral Air entry equal and Decreased, no Crackles, no wheezes Abdomen: Bowel Sound present, Soft and no tenderness,  Skin: no rashes Extremities: no Pedal edema, no calf tenderness, s/p Right BKA stump washout, dressing CDI  Neurologic: Awake and alert, nonfocal  Data Reviewed:  Hemoglobin 7.7  Family Communication: None at bedside    Diet: Heart healthy/carb modified DVT Prophylaxis: Subcutaneous Lovenox    Advance goals of care discussion: Full code     Disposition:  Pt is from SNF, admitted with right BKA infection and depression with SI/HI ideations under BHU, still has infection, on IV antibiotics, s/p right BKA stump washout done on 1/5, second washout on 1/9 , wound culture growing MRSA, ID consulted for further recommendation, which precludes a safe discharge. Discharge to SNF, when cleared by vascular surgery and ID TOC working on placement  Disposition: Status is: Inpatient Remains inpatient appropriate because: Continuation of IV antibiotics  Planned Discharge Destination: Skilled nursing facility    Time spent: 35 minutes  Author: Max Sane, MD  11/22/2022 1:48 PM  For on call review [email or web address].

## 2022-11-22 NOTE — Progress Notes (Signed)
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 Pt is now s/p excisional debridement & drainage of abscess & application of wound vac on RLE residual limb on 11/14/22,  revision and closure of RLE residual limb 11/18/22. PMH significant for CAD s/p CABG, HFrEF with recovered EF, PVD, type 2 diabetes, COPD, hypertension, hyperlipidemia, hypothyroidism, depression    PT Comments    Pt was side lying in bed upon arriving. Unaware he has had BM. Session overall was greatly limited by Bms x 3 Pt was alert, cooperative and motivated overall. He was able to achieve EOB short sit with CGA only. Required min assist to lateral scoot transfer to recliner. Had BM during transfers. RN tech + Chief Strategy Officer stood pt 1 x with max +2 assist. Pt unable to stay standing for more than 5 secs. Elected to lateral transfer back to bed for hygiene care. Reviewed positioning and importance of performing exercises to promote strengthening + improve activity tolerance. He states understanding but will need continued re-enforcement in future sessions. Highly recommend DC to STR to maximize independence while decreasing caregiver burden.    Recommendations for follow up therapy are one component of a multi-disciplinary discharge planning process, led by the attending physician.  Recommendations may be updated based on patient status, additional functional criteria and insurance authorization.  Follow Up Recommendations  Skilled nursing-short term rehab (<3 hours/day)     Assistance Recommended at Discharge Frequent or constant Supervision/Assistance  Patient can return home with the following Two people to help with walking and/or transfers;A lot of help with  bathing/dressing/bathroom;Assistance with cooking/housework;Direct supervision/assist for medications management;Direct supervision/assist for financial management;Assist for transportation;Help with stairs or ramp for entrance   Equipment Recommendations  Other (comment) (defer to next level of care)       Precautions / Restrictions Precautions Precautions: Fall Restrictions Weight Bearing Restrictions: Yes [name] Weight Bearing: Weight bearing as tolerated LUE Weight Bearing: Weight bearing as tolerated RLE Weight Bearing: Non weight bearing LLE Weight Bearing: Weight bearing as tolerated Other Position/Activity Restrictions: no activity/WB orders in chart, wound along R BKA incision, treated as NWBing today     Mobility  Bed Mobility Overal bed mobility: Needs Assistance Bed Mobility: Supine to Sit, Rolling, Sidelying to Sit, Sit to Supine Rolling: Supervision Sidelying to sit: Min guard (increased time required) Supine to sit: Min guard Sit to supine: Supervision General bed mobility comments: pt was able to achieve EOB sitting and return to supie after OOB activity with CGA assist for safety. no physical liftassistnce however increased time + vcs for improved technique. pt needs some extended breaks between task due to fatigue but overall tolerated well.    Transfers Overall transfer level: Needs assistance Equipment used: Rolling walker (2 wheels) Transfers: Sit to/from Stand, Bed to chair/wheelchair/BSC Sit to Stand: +2 physical assistance, Max assist, +2 safety/equipment   Lateral/Scoot Transfers: Min assist General transfer comment: Pt was able to lateral transfer from bed to recliner with L armrest dropped with min assist of one. Due to BMs attempted with RN tech Thurmond Butts) to STS. Pt was able to achieve standing but not long enough for hygiene care to be before. Pt returned to bed via lateral transfer. with returning to bed, pt requested recliner be flat and he rolled back  into  bed from recliner. hygiene care provided once pt was back in bed. session overall greatly limited by BMs x 3    Ambulation/Gait    General Gait Details: currently unable    Balance Overall balance assessment: Needs assistance Sitting-balance support: Single extremity supported Sitting balance-Leahy Scale: Good Sitting balance - Comments: no LOB while seated EOB   Standing balance support: Reliant on assistive device for balance, Bilateral upper extremity supported Standing balance-Leahy Scale: Zero Standing balance comment: require +2 max assist to stand 1 x from recliner. Only tolerated standing x ~ 5 sec.         Cognition Arousal/Alertness: Awake/alert Behavior During Therapy: WFL for tasks assessed/performed Overall Cognitive Status: Within Functional Limits for tasks assessed    General Comments: Pt was A and O x 4.           General Comments General comments (skin integrity, edema, etc.): reviewed importance of limp positioning and need for continued ther ex in all extremities to promote strengthening and return in function      Pertinent Vitals/Pain Pain Assessment Pain Assessment: 0-10 Pain Score: 2  Faces Pain Scale: Hurts a little bit Pain Location: R residual limb Pain Descriptors / Indicators: Aching, Grimacing Pain Intervention(s): Limited activity within patient's tolerance, Monitored during session, Premedicated before session, Repositioned     PT Goals (current goals can now be found in the care plan section) Acute Rehab PT Goals Patient Stated Goal: get better Progress towards PT goals: Progressing toward goals    Frequency    7X/week      PT Plan Current plan remains appropriate       AM-PAC PT "6 Clicks" Mobility   Outcome Measure  Help needed turning from your back to your side while in a flat bed without using bedrails?: None Help needed moving from lying on your back to sitting on the side of a flat bed without using bedrails?: A  Little Help needed moving to and from a bed to a chair (including a wheelchair)?: A Little Help needed standing up from a chair using your arms (e.g., wheelchair or bedside chair)?: Total Help needed to walk in hospital room?: Total Help needed climbing 3-5 steps with a railing? : Total 6 Click Score: 13    End of Session   Activity Tolerance: Patient tolerated treatment well;Other (comment) (limityed by Bms x 3) Patient left: in bed;with call bell/phone within reach;with bed alarm set;with nursing/sitter in room Nurse Communication: Mobility status PT Visit Diagnosis: Muscle weakness (generalized) (M62.81);Difficulty in walking, not elsewhere classified (R26.2);Other abnormalities of gait and mobility (R26.89)     Time: 1110-1131 PT Time Calculation (min) (ACUTE ONLY): 21 min  Charges:  $Therapeutic Activity: 8-22 mins          Julaine Fusi PTA 11/22/22, 12:22 PM

## 2022-11-22 NOTE — Plan of Care (Addendum)
01.30am Soap suds enema given,patient tolerated well. 02.46 Patient had a large bowel movement and verbalized relief.  Problem: Education: Goal: Ability to describe self-care measures that may prevent or decrease complications (Diabetes Survival Skills Education) will improve Outcome: Progressing Goal: Individualized Educational Video(s) Outcome: Progressing   Problem: Coping: Goal: Ability to adjust to condition or change in health will improve Outcome: Progressing   Problem: Fluid Volume: Goal: Ability to maintain a balanced intake and output will improve Outcome: Progressing   Problem: Health Behavior/Discharge Planning: Goal: Ability to identify and utilize available resources and services will improve Outcome: Progressing Goal: Ability to manage health-related needs will improve Outcome: Progressing   Problem: Metabolic: Goal: Ability to maintain appropriate glucose levels will improve Outcome: Progressing   Problem: Nutritional: Goal: Maintenance of adequate nutrition will improve Outcome: Progressing Goal: Progress toward achieving an optimal weight will improve Outcome: Progressing   Problem: Skin Integrity: Goal: Risk for impaired skin integrity will decrease Outcome: Progressing   Problem: Tissue Perfusion: Goal: Adequacy of tissue perfusion will improve Outcome: Progressing   Problem: Education: Goal: Knowledge of General Education information will improve Description: Including pain rating scale, medication(s)/side effects and non-pharmacologic comfort measures Outcome: Progressing   Problem: Health Behavior/Discharge Planning: Goal: Ability to manage health-related needs will improve Outcome: Progressing   Problem: Clinical Measurements: Goal: Ability to maintain clinical measurements within normal limits will improve Outcome: Progressing Goal: Will remain free from infection Outcome: Progressing

## 2022-11-23 LAB — BASIC METABOLIC PANEL
Anion gap: 7 (ref 5–15)
BUN: 80 mg/dL — ABNORMAL HIGH (ref 6–20)
CO2: 25 mmol/L (ref 22–32)
Calcium: 8.8 mg/dL — ABNORMAL LOW (ref 8.9–10.3)
Chloride: 102 mmol/L (ref 98–111)
Creatinine, Ser: 1.24 mg/dL (ref 0.61–1.24)
GFR, Estimated: >60 mL/min (ref >60)
Glucose, Bld: 123 mg/dL — ABNORMAL HIGH (ref 70–99)
Potassium: 4.1 mmol/L (ref 3.5–5.1)
Sodium: 134 mmol/L — ABNORMAL LOW (ref 135–145)

## 2022-11-23 LAB — CBC
HCT: 24.4 % — ABNORMAL LOW (ref 39.0–52.0)
Hemoglobin: 7.7 g/dL — ABNORMAL LOW (ref 13.0–17.0)
MCH: 29.3 pg (ref 26.0–34.0)
MCHC: 31.6 g/dL (ref 30.0–36.0)
MCV: 92.8 fL (ref 80.0–100.0)
Platelets: 306 10*3/uL (ref 150–400)
RBC: 2.63 MIL/uL — ABNORMAL LOW (ref 4.22–5.81)
RDW: 16.2 % — ABNORMAL HIGH (ref 11.5–15.5)
WBC: 10.3 10*3/uL (ref 4.0–10.5)
nRBC: 0 % (ref 0.0–0.2)

## 2022-11-23 LAB — GLUCOSE, CAPILLARY
Glucose-Capillary: 119 mg/dL — ABNORMAL HIGH (ref 70–99)
Glucose-Capillary: 163 mg/dL — ABNORMAL HIGH (ref 70–99)
Glucose-Capillary: 155 mg/dL — ABNORMAL HIGH (ref 70–99)
Glucose-Capillary: 164 mg/dL — ABNORMAL HIGH (ref 70–99)

## 2022-11-23 NOTE — Progress Notes (Addendum)
PT Cancellation Note  Patient Details Name: DENAHI LADZINSKI MRN: [id number] DOB: [birth year]   Cancelled Treatment:    Reason Eval/Treat Not Completed: Medical issues which prohibited therapy  Pt on bedpan.  Assisted in care and started bedside ex and pt again needed to have a BM.  Replaced bedpan and tech aware to check.    Danielle Dess 11/23/2022, 12:12 PM

## 2022-11-23 NOTE — Progress Notes (Signed)
PROGRESS NOTE    Shane Chambers  JSE:[id number] DOB: 06-12-1961 DOA: 11/11/2022 PCP: Caprice Renshaw, MD   Brief Narrative:    Shane Chambers is a 62 y.o. male with past medical history of CAD s/p CABG, HFrEF with recovered EF, PVD, type 2 diabetes, COPD, hypertension, hyperlipidemia, hypothyroidism, depression who is current he admitted for suicidal ideation and found to have complication of his amputation site.  [name] states that for the last 3 days, he has been experiencing increasing pain of his right BKA stump in addition to increasing erythema.  His nurse at bedside states that he has been picking at it. Patient has not noticed any purulent drainage at this time but there are areas where he is worried that the incision is opening. Per chart review, patient was admitted on 11/23 for cellulitis of the right lower extremity extending from the foot up towards the knee.  CT imaging at that time demonstrated holdable complex fluid collections and possible medial malleolus osteomyelitis.  MRI was subsequently obtained that demonstrated right ankle septic arthritis.  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  He followed up with orthopedic surgery on 12/14.  At that time, he was instructed to follow-up in 1 week for staple removal.  Assessment & Plan:   Right BKA stump abscess/MRSA wound infection, s/p revision and washout done on 11/14/2022 -H/o Acute osteomyelitis, s/p right BKA -Initially patient was seen in consultation at [facility], however required transfer to the medical unit due to inability to administer IV antibiotics.  Patient presented with 3 days of gradually worsening right stump erythema and pain.  Initial concern for cellulitis or only, however vascular surgery was able to put a probe down to the bone with 200 cc of purulent drainage, more consistent with osteomyelitis. Inflammatory markers are elevated. -S/p excisional debridement right below-knee amputation stump on 11/18/2022 per  vascular surgery -S/p ceftriaxone and metronidazole, which were discontinued by ID.  Wound culture growing MRSA  -ID changed antibiotic to IV daptomycin  -Continue gabapentin 600 mg p.o. twice daily, Continue oxycodone 5 mg p.o. every 4 hourly as needed -Follow vascular surgery and ID for discharge planning     Constipation -Now has multiple loose stools.  MiraLAX and Colace discontinued  Elevated creatinine could be due to dehydration -Patient was on vancomycin but due to elevated creatinine changed antibiotic to IV daptomycin per ID -Hold off torsemide home dose 40 mg p.o. daily over the weekend, and decreased torsemide 20 mg p.o. daily to be started from Monday. 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  EKG on 1/12 with improved QTc.    Hypertension, continue amlodipine, Lopressor   HLD, continue statin, monitor CK while on daptomycin and statin   Depression -Continue SSRIs and antipsychotics started on recent [place] [facility] admission  -Continue Abilify and Effexor   Type 2 diabetes mellitus (HCC) -SSI, moderate -Hold home metformin   Hypothyroid, continued Synthroid  BPH, continue Flomax  Vitamin D insufficiency: started vitamin D 50,000 units p.o. weekly, follow with PCP to repeat vitamin D level after 3 to 6 months.  Anemia secondary to iron deficiency -Continue oral iron supplement with vitamin C, repeat iron profile after 3 to 6 months  Atrophy of bilateral hand muscles, unknown cause -With his chronic, started after neck trauma happened in the year 2000 as per patient.  Recommended to -follow with neurology for EMG and NCS  test as an outpatient           Pressure Injury 11/06/22 Buttocks Right Stage 2 -  Partial thickness loss of dermis presenting as a shallow open injury with a red, pink wound bed without slough. no odor. no drainage. no slough. 1cm X 0.3cm (Active)   11/06/22 1800  Location: Buttocks  Location Orientation: Right  Staging: Stage 2 -  Partial thickness loss of dermis presenting as a shallow open injury with a red, pink wound bed without slough.  Wound Description (Comments): no odor. no drainage. no slough. 1cm X 0.3cm  Present on Admission:   Dressing Type None 11/18/22 0820     DVT prophylaxis: Lovenox Code Status: Full code Family Communication:  None present at bedside.  Plan of care discussed with patient in length and he verbalized understanding and agreed with it. Disposition Plan: To be determined  Consultants:  Vascular surgery ID  Procedures:  Excisional debridement right below-knee amputation stump  Antimicrobials:  Daptomycin  Status is: Inpatient     Subjective: Patient seen and examined.  Reports that overall he feels better.  RN at the bedside.  As per RN patient has multiple loose stools since yesterday.  Yellow-brown in color, not associated with blood.  MiraLAX and Colace was discontinued.  Patient is very hard stick.  IV team was consulted yesterday as his IV came off.  Needs PICC line if he needs long course of IV antibiotics.  Await ID recommendation.  Objective: Vitals:   11/22/22 1944 11/23/22 0500 11/23/22 0523 11/23/22 0700  BP: (!) 112/46  (!) 144/59 (!) 140/60  Pulse: 64  (!) 59 (!) 59  Resp: '17  18 18  '$ Temp: 98.6 F (37 C)  98.2 F (36.8 C) 97.8 F (36.6 C)  TempSrc:    Oral  SpO2: 96%  100% 99%  Weight:  101.8 kg    Height:        Intake/Output Summary (Last 24 hours) at 11/23/2022 1254 Last data filed at 11/23/2022 1136 Gross per 24 hour  Intake --  Output 2450 ml  Net -2450 ml   Filed Weights   11/21/22 0446 11/22/22 0500 11/23/22 0500  Weight: 111.6 kg 111.6 kg 101.8 kg    Examination:  General exam: Appears calm and comfortable, on room air, communicating well, appears weak and lethargic. Respiratory system: Clear to auscultation. Respiratory effort  normal. Cardiovascular system: S1 & S2 heard, RRR. No JVD, murmurs, rubs, gallops or clicks. No pedal edema. Gastrointestinal system: Abdomen is nondistended, soft and nontender. No organomegaly or masses felt. Normal bowel sounds heard. Central nervous system: Alert and oriented. No focal neurological deficits. Extremities: Status post right BKA.  Dressing dry and intact.  No bleeding seen. Skin: No rashes, lesions or ulcers Psychiatry: Judgement and insight appear normal. Mood & affect appropriate.    Data Reviewed: I have personally reviewed following labs and imaging studies  CBC: Recent Labs  Lab 11/19/22 0402 11/20/22 0400 11/21/22 0420 11/22/22 0603 11/23/22 0535  WBC 12.5* 12.5* 15.0* 10.6* 10.3  HGB 8.2* 7.9* 8.4* 7.7* 7.7*  HCT 26.0* 25.3* 26.9* 24.5* 24.4*  MCV 90.9 92.3 93.4 92.5 92.8  PLT 350 335 346 308 825   Basic Metabolic Panel: Recent Labs  Lab 11/18/22 0418 11/19/22 0402 11/20/22 0400 11/21/22 0420 11/22/22 0603 11/23/22 0535  NA 136 136 135 134* 138 134*  K 4.1 4.3 4.4 4.8 4.3 4.1  CL 101 102 101 99 104 102  CO2 26 25 25  $'27 24 25  't$ GLUCOSE 158* 158* 142* 143* 114* 123*  BUN 64* 64* 68* 78* 82* 80*  CREATININE 1.11 1.07 1.22 1.42* 1.32* 1.24  CALCIUM 8.9 8.8* 8.7* 8.8* 9.1 8.8*  MG 1.8 1.9 2.0 2.1  --   --   PHOS 3.3 4.4 5.1* 5.2*  --   --    GFR: Estimated Creatinine Clearance: 75.4 mL/min (by C-G formula based on SCr of 1.24 mg/dL). Liver Function Tests: No results for input(s): "AST", "ALT", "ALKPHOS", "BILITOT", "PROT", "ALBUMIN" in the last 168 hours. No results for input(s): "LIPASE", "AMYLASE" in the last 168 hours. No results for input(s): "AMMONIA" in the last 168 hours. Coagulation Profile: No results for input(s): "INR", "PROTIME" in the last 168 hours. Cardiac Enzymes: Recent Labs  Lab 11/22/22 0603  CKTOTAL 34*   BNP (last 3 results) No results for input(s): "PROBNP" in the last 8760 hours. HbA1C: No results for input(s):  "HGBA1C" in the last 72 hours. CBG: Recent Labs  Lab 11/22/22 1245 11/22/22 1616 11/22/22 2055 11/23/22 0755 11/23/22 1247  GLUCAP 177* 153* 128* 119* 163*   Lipid Profile: No results for input(s): "CHOL", "HDL", "LDLCALC", "TRIG", "CHOLHDL", "LDLDIRECT" in the last 72 hours. Thyroid Function Tests: No results for input(s): "TSH", "T4TOTAL", "FREET4", "T3FREE", "THYROIDAB" in the last 72 hours. Anemia Panel: No results for input(s): "VITAMINB12", "FOLATE", "FERRITIN", "TIBC", "IRON", "RETICCTPCT" in the last 72 hours. Sepsis Labs: No results for input(s): "PROCALCITON", "LATICACIDVEN" in the last 168 hours.  Recent Results (from the past 240 hour(s))  Aerobic/Anaerobic Culture w Gram Stain (surgical/deep wound)     Status: None   Collection Time: 11/14/22 11:19 AM   Specimen: PATH Other; Tissue  Result Value Ref Range Status   Specimen Description   Final    WOUND RIGHT BKA Performed at [facility], [address], [place], [place] [address]    Special Requests   Final    NONE Performed at [facility], [address] [place], [place] [address]    Gram Stain   Final    RARE WBC PRESENT, PREDOMINANTLY PMN NO ORGANISMS SEEN    Culture   Final    RARE METHICILLIN RESISTANT STAPHYLOCOCCUS AUREUS NO ANAEROBES ISOLATED Performed at [facility], [place] [address], [place], [place] [address]    Report Status 11/19/2022 FINAL  Final   Organism ID, Bacteria METHICILLIN RESISTANT STAPHYLOCOCCUS AUREUS  Final      Susceptibility   Methicillin resistant staphylococcus aureus - MIC*    CIPROFLOXACIN >=8 RESISTANT Resistant     ERYTHROMYCIN >=8 RESISTANT Resistant     GENTAMICIN <=0.5 SENSITIVE Sensitive     OXACILLIN >=4 RESISTANT Resistant     TETRACYCLINE >=16 RESISTANT Resistant     VANCOMYCIN 1 SENSITIVE Sensitive     TRIMETH/SULFA <=10 SENSITIVE Sensitive     CLINDAMYCIN >=8 RESISTANT Resistant     RIFAMPIN <=0.5 SENSITIVE Sensitive      Inducible Clindamycin NEGATIVE Sensitive     * RARE METHICILLIN RESISTANT STAPHYLOCOCCUS AUREUS      Radiology Studies: DG Abd 1 View  Result Date: 11/21/2022 CLINICAL DATA:  Lower abdominal pain. Constipation. Concern for impaction. EXAM: ABDOMEN - 1 VIEW COMPARISON:  One-view abdomen 10/10/2022 FINDINGS: The heart is enlarged. Mild airspace opacity at the right base likely reflects atelectasis. Moderate stool is present throughout the colon. The rectum is not dilated. Small bowel gas pattern is within normal limits. Vascular calcifications are noted. IMPRESSION: 1. Moderate stool throughout the colon  without evidence for obstruction. 2. Cardiomegaly without failure. Electronically Signed   By: San Morelle M.D.   On: 11/21/2022 17:24    Scheduled Meds:  amLODipine  10 mg Oral Daily   ARIPiprazole  10 mg Oral Daily   vitamin C  500 mg Oral BID   aspirin EC  81 mg Oral Q breakfast   atorvastatin  80 mg Oral QHS   bisacodyl  10 mg Oral BID   clotrimazole   Topical BID   enoxaparin (LOVENOX) injection  0.5 mg/kg Subcutaneous Q24H   ferrous gluconate  324 mg Oral Daily   fluticasone furoate-vilanterol  1 puff Inhalation Daily   gabapentin  600 mg Oral BID    HYDROmorphone (DILAUDID) injection  0.5 mg Intravenous BID   insulin aspart  0-15 Units Subcutaneous TID AC & HS   levothyroxine  75 mcg Oral Q0600   magnesium oxide  400 mg Oral Daily   metoprolol tartrate  50 mg Oral BID   multivitamin with minerals  1 tablet Oral Daily   pantoprazole  40 mg Oral Daily   polyethylene glycol  17 g Oral BID   polyvinyl alcohol  1 drop Both Eyes Daily   potassium chloride SA  20 mEq Oral Daily   Ensure Max Protein  11 oz Oral TID   sodium chloride flush  3 mL Intravenous Q12H   tamsulosin  0.8 mg Oral Daily   thiamine  100 mg Oral Daily   [START ON 11/24/2022] torsemide  20 mg Oral Daily   vancomycin variable dose per unstable renal function (pharmacist dosing)   Does not apply See admin  instructions   venlafaxine XR  150 mg Oral Q breakfast   Vitamin D (Ergocalciferol)  50,000 Units Oral Q7 days   zinc sulfate  220 mg Oral Daily   Continuous Infusions:  DAPTOmycin (CUBICIN) 700 mg in sodium chloride 0.9 % IVPB 700 mg (11/22/22 1622)     LOS: 12 days   Time spent: 35 minutes   Garry Nicolini Loann Quill, MD [facility]  If 7PM-7AM, please contact night-coverage [email or web address] 11/23/2022, 12:54 PM

## 2022-11-24 LAB — BASIC METABOLIC PANEL
Anion gap: 11 (ref 5–15)
BUN: 75 mg/dL — ABNORMAL HIGH (ref 6–20)
CO2: 23 mmol/L (ref 22–32)
Calcium: 9.2 mg/dL (ref 8.9–10.3)
Chloride: 104 mmol/L (ref 98–111)
Creatinine, Ser: 1.25 mg/dL — ABNORMAL HIGH (ref 0.61–1.24)
GFR, Estimated: >60 mL/min (ref >60)
Glucose, Bld: 140 mg/dL — ABNORMAL HIGH (ref 70–99)
Potassium: 4.2 mmol/L (ref 3.5–5.1)
Sodium: 138 mmol/L (ref 135–145)

## 2022-11-24 LAB — CBC
HCT: 28.7 % — ABNORMAL LOW (ref 39.0–52.0)
Hemoglobin: 9.1 g/dL — ABNORMAL LOW (ref 13.0–17.0)
MCH: 28.9 pg (ref 26.0–34.0)
MCHC: 31.7 g/dL (ref 30.0–36.0)
MCV: 91.1 fL (ref 80.0–100.0)
Platelets: 349 10*3/uL (ref 150–400)
RBC: 3.15 MIL/uL — ABNORMAL LOW (ref 4.22–5.81)
RDW: 16.2 % — ABNORMAL HIGH (ref 11.5–15.5)
WBC: 14.1 10*3/uL — ABNORMAL HIGH (ref 4.0–10.5)
nRBC: 0 % (ref 0.0–0.2)

## 2022-11-24 LAB — GLUCOSE, CAPILLARY
Glucose-Capillary: 134 mg/dL — ABNORMAL HIGH (ref 70–99)
Glucose-Capillary: 165 mg/dL — ABNORMAL HIGH (ref 70–99)
Glucose-Capillary: 153 mg/dL — ABNORMAL HIGH (ref 70–99)
Glucose-Capillary: 165 mg/dL — ABNORMAL HIGH (ref 70–99)

## 2022-11-24 MED ORDER — TORSEMIDE 20 MG PO TABS: 20.0000 mg | ORAL_TABLET | Freq: Every day | ORAL | Status: DC

## 2022-11-24 MED ORDER — HYDROMORPHONE HCL 1 MG/ML IJ SOLN
0.5000 mg | Freq: Two times a day (BID) | INTRAMUSCULAR | Status: DC | PRN
  Administered 2022-11-24 – 2022-11-28 (×6): 0.5 mg via INTRAVENOUS
  Filled 2022-11-24 (×7): qty 0.5

## 2022-11-24 MED ORDER — BISACODYL 5 MG PO TBEC
10.0000 mg | DELAYED_RELEASE_TABLET | Freq: Every day | ORAL | Status: DC
  Administered 2022-11-26: 10 mg via ORAL
  Filled 2022-11-24 (×2): qty 2

## 2022-11-24 MED ORDER — ATORVASTATIN CALCIUM 20 MG PO TABS
40.0000 mg | ORAL_TABLET | Freq: Every day | ORAL | Status: DC
  Administered 2022-11-24 – 2022-11-27 (×4): 40 mg via ORAL
  Filled 2022-11-24 (×4): qty 2

## 2022-11-24 MED ORDER — POLYETHYLENE GLYCOL 3350 17 G PO PACK
17.0000 g | PACK | Freq: Every day | ORAL | Status: DC
  Administered 2022-11-26 – 2022-11-27 (×2): 17 g via ORAL
  Filled 2022-11-24 (×4): qty 1

## 2022-11-24 NOTE — Progress Notes (Signed)
Date of Admission:  11/11/2022    ID: Shane Chambers is a 62 y.o. male Principal Problem:   Acute osteomyelitis ([place]) Active Problems:   Type 2 diabetes mellitus (HCC)   Depression   QT prolongation   MRSA infection   Amputation stump infection ([place])  PT doing much better Had a solid bowel movt Says he does not feel impacted any more  Medications:   amLODipine  10 mg Oral Daily   ARIPiprazole  10 mg Oral Daily   vitamin C  500 mg Oral BID   aspirin EC  81 mg Oral Q breakfast   atorvastatin  40 mg Oral QHS   bisacodyl  10 mg Oral QHS   clotrimazole   Topical BID   enoxaparin (LOVENOX) injection  0.5 mg/kg Subcutaneous Q24H   ferrous gluconate  324 mg Oral Daily   fluticasone furoate-vilanterol  1 puff Inhalation Daily   gabapentin  600 mg Oral BID   insulin aspart  0-15 Units Subcutaneous TID AC & HS   levothyroxine  75 mcg Oral Q0600   magnesium oxide  400 mg Oral Daily   metoprolol tartrate  50 mg Oral BID   multivitamin with minerals  1 tablet Oral Daily   pantoprazole  40 mg Oral Daily   polyethylene glycol  17 g Oral BID   polyvinyl alcohol  1 drop Both Eyes Daily   potassium chloride SA  20 mEq Oral Daily   Ensure Max Protein  11 oz Oral TID   sodium chloride flush  3 mL Intravenous Q12H   tamsulosin  0.8 mg Oral Daily   thiamine  100 mg Oral Daily   [START ON 11/26/2022] torsemide  20 mg Oral Daily   venlafaxine XR  150 mg Oral Q breakfast   Vitamin D (Ergocalciferol)  50,000 Units Oral Q7 days   zinc sulfate  220 mg Oral Daily    Objective: Vital signs in last 24 hours: Temp:  [97.7 F (36.5 C)-98.1 F (36.7 C)] 98 F (36.7 C) (01/15 0739) Pulse Rate:  [61-73] 73 (01/15 0739) Resp:  [16-20] 20 (01/15 0739) BP: (120-151)/(59-67) 138/62 (01/15 0739) SpO2:  [96 %-97 %] 97 % (01/15 0739) Weight:  [111.1 kg] 111.1 kg (01/15 0500)    PHYSICAL EXAM:  General: Alert, cooperative, no distress .  Lungs: Clear to auscultation bilaterally. No Wheezing or  Rhonchi. No rales. Heart: Regular rate and rhythm, 3/6 systolic murmur Abdomen: Soft, non-tender,not distended. Bowel sounds normal. No masses Extremities:  RT BKA      Still has some slough/yellow tissue  Skin: No rashes or lesions. Or bruising Lymph: Cervical, supraclavicular normal. Neurologic: Grossly non-focal  Lab Results Recent Labs    11/23/22 0535 11/24/22 0440  WBC 10.3 14.1*  HGB 7.7* 9.1*  HCT 24.4* 28.7*  NA 134* 138  K 4.1 4.2  CL 102 104  CO2 25 23  BUN 80* 75*  CREATININE 1.24 1.25*   Microbiology: Stump culture MRSA    Assessment/Plan: MRSA wound infection of the Rt BKA stump- S/p debridement and wound vac placement-but now wound vac has been removed-  On dapto while on dapto because of risk of rhabdo- reduced statin to '40mg'$    AKI- because of diarrhea, volume depeletion along with Torsemide Getting some IV fluids  DM on insulin   HTN on amlodipine, metoprolol and torsemide   Depression/Anxiety- on Venlafaxine and aripiprazole   Will discuss with vascular regarding antibiotic, whether we can switch to PO and  also need latest pics I

## 2022-11-24 NOTE — Progress Notes (Signed)
  Progress Note    11/24/2022 2:13 PM 6 Days Post-Op  Subjective:  Shane Chambers is a 62 year old male who is now POD # 6 status post wound washout of left BKA stump infection.  Denies any pain.  Denies any drainage from the wound.  No complications to note. Vitals all remained stable.  Dressing change was completed by nursing staff early this morning. No visual drainage noted to the dressing.    Vitals:   11/24/22 0424 11/24/22 0739  BP: (!) 151/61 138/62  Pulse: 71 73  Resp: 16 20  Temp: 97.7 F (36.5 C) 98 F (36.7 C)  SpO2: 97% 97%   Physical Exam: Cardiac:  RRR Lungs:  Clear throughout with diminished breath sounds in the bases Incisions:  Dressing C/D/I Changed earlier today.  Extremities:  Left BKA  Abdomen:  Bowel sounds all 4 quadrants, soft, NT/ND  Neurologic: AAO x 3 person, place and time   CBC    Component Value Date/Time   WBC 14.1 (H) 11/24/2022 0440   RBC 3.15 (L) 11/24/2022 0440   HGB 9.1 (L) 11/24/2022 0440   HCT 28.7 (L) 11/24/2022 0440   PLT 349 11/24/2022 0440   MCV 91.1 11/24/2022 0440   MCH 28.9 11/24/2022 0440   MCHC 31.7 11/24/2022 0440   RDW 16.2 (H) 11/24/2022 0440   LYMPHSABS 1.5 11/10/2022 1916   MONOABS 1.0 11/10/2022 1916   EOSABS 0.7 (H) 11/10/2022 1916   BASOSABS 0.1 11/10/2022 1916    BMET    Component Value Date/Time   NA 138 11/24/2022 0440   K 4.2 11/24/2022 0440   CL 104 11/24/2022 0440   CO2 23 11/24/2022 0440   GLUCOSE 140 (H) 11/24/2022 0440   BUN 75 (H) 11/24/2022 0440   CREATININE 1.25 (H) 11/24/2022 0440   CALCIUM 9.2 11/24/2022 0440   GFRNONAA >60 11/24/2022 0440   GFRAA >60 08/07/2020 0255    INR    Component Value Date/Time   INR 1.5 (H) 09/25/2022 1801     Intake/Output Summary (Last 24 hours) at 11/24/2022 1413 Last data filed at 11/24/2022 0428 Gross per 24 hour  Intake --  Output 1950 ml  Net -1950 ml     Assessment/Plan:  62 y.o. male is s/p wound washout of left BKA stump infection.   Denies any pain.  Denies any drainage from the wound. No Complications to note.  Vitals all remained stable.   6 Days Post-Op   Dressing was changed earlier today by Nursing staff. Patient requested that I not change it again today as he was sitting in the bedside chair and states it is easier in the bed. Nursing reports wound bed was pink and without any drainage. "Healing slowly" . I informed the patient I will be changing the dressing tomorrow myself, I need to inspect the wound myself. He agreed and will remind the nursing staff I will be doing the dressing change tomorrow.  DVT prophylaxis:  Lovenox   Drema [facility] 11/24/2022 2:13 PM

## 2022-11-24 NOTE — TOC Progression Note (Addendum)
Transition of Care [facility]) - Progression Note    Patient Details  Name: Shane Chambers MRN: [id number] Date of Birth: 1961-05-25  Transition of Care [facility]) CM/SW Contact  Beverly Sessions, RN Phone Number: 11/24/2022, 12:46 PM  Clinical Narrative:    No other bed offers than [place].  Notified Ebony Hail at [place] that patient may require Daptomycin at discharge        Expected Discharge Plan and Services                                               Social Determinants of Health (SDOH) Interventions SDOH Screenings   Food Insecurity: No Food Insecurity (11/13/2022)  Housing: Low Risk  (11/13/2022)  Transportation Needs: No Transportation Needs (11/13/2022)  Utilities: Not At Risk (11/13/2022)  Alcohol Screen: Low Risk  (11/05/2022)  Tobacco Use: Medium Risk (11/20/2022)    Readmission Risk Interventions    11/11/2022    2:05 PM 10/03/2022   10:08 AM 09/26/2022    1:16 PM  Readmission Risk Prevention Plan  Transportation Screening Complete Complete Complete  PCP or Specialist Appt within 3-5 Days   Complete  HRI or [place]   Complete  Social Work Consult for [place] Planning/Counseling   Complete  Palliative Care Screening   Not Applicable  Medication Review [employer])   Complete  HRI or Home Care Consult Complete Complete   SW Recovery Care/Counseling Consult Complete Complete   Palliative Care Screening Not Applicable Not Applicable   Skilled Nursing Facility Complete Complete

## 2022-11-24 NOTE — Progress Notes (Signed)
Pharmacy Antibiotic Note  Shane Chambers is a 62 y.o. male w/ PMH of BPH, DM, CAD, CABG, CHF, HTN, hypothyroidism, depression, BKA admitted on 11/11/2022 with  right below-knee amputation infection .  Pharmacy has been consulted for Daptomycin dosing. Previously on vancomycin but due to rising SCr changing to daptomyin   Today, 11/24/2022 Day 14 vancomycin for stump infection SCr 1.25 - improved (stable for last 2-3 days) BUN also increasing may be dehydrated with diarrhea and torsemide Torsemide held since 1/13 WBC 14.1 Afebrile OR culture from 1/5 with MRSA (susc to vanco, tmp/smz) Unexpected vancomycin kinetics based his SCr 1/13 CK = 34 Atorvastatin reduced to '40mg'$  daily with start of daptomycin   Level information 1/11 random vancomycin level today at 0400 = 14 mcg/mL Calculated Ke = 0.0157 for half-life of 44 hours   1/10: random vancomycin level at 12:10 = 18 mcg/ml, vancomycin '1750mg'$  IV q48h (1st dose) given 1/9 at 12:14  1/7 and 1/8: Levels were drawn at steady-state on 750 mg IV vancomycin every 12 hours (last dose prior to levels was 1/6 at 22:21)resulting in the following pharmacokinetic parameters: vanc peak was 36 mcg/ml (1/7 @ 00:29) and trough = 30 mcg/mL 1/7 @ 09:50, this does not appear to be lab error as a random level at 21:52 1/7 = 21 mcg/mL  Ke: 0.0195 h-1 T1/2: 35.5 h  Plan:  Continue Daptomycin '8mg'$ /kg per adjusted BW ('700mg'$ ) IV q24h CK WNL Follow CK  Follow renal function for adjustments Informed TOC that d/c on Daptomycin is possible (plan is SNF)  Height: '5\' 9"'$  (175.3 cm) Weight: 111.1 kg (244 lb 14.9 oz) IBW/kg (Calculated) : 70.7 Temp (24hrs), Avg:97.9 F (36.6 C), Min:97.7 F (36.5 C), Max:98.1 F (36.7 C)  Recent Labs  Lab 11/20/22 0400 11/21/22 0420 11/22/22 0603 11/23/22 0535 11/24/22 0440  WBC 12.5* 15.0* 10.6* 10.3 14.1*  CREATININE 1.22 1.42* 1.32* 1.24 1.25*  VANCORANDOM 14  --  14  --   --      Estimated Creatinine Clearance:  78.2 mL/min (A) (by C-G formula based on SCr of 1.25 mg/dL (H)).    Allergies  Allergen Reactions   Ace Inhibitors Swelling and Cough    (Not on MAR at [facility] Glaz[facility])   Haloperidol And Related     Do NOT give anti-psychotics due to risk of  Torsades and QT prolongation (per Dr. Graciela Husbands request)   Other Itching    [name]   Shellfish Allergy    Penicillins Itching and Rash    Has patient had a PCN reaction causing immediate rash, facial/tongue/throat swelling, SOB or lightheadedness with hypotension: Unknown Has patient had a PCN reaction causing severe rash involving mucus membranes or skin necrosis: Unknown Has patient had a PCN reaction that required hospitalization: Unknown Has patient had a PCN reaction occurring within the last 10 years: Unknown If all of the above answers are "NO", then may proceed with Cephalosporin use.     Antimicrobials this admission: 1/13 daptomycin >> 1/1 vancomycin >> 1/12 1/1 metronidazole >>1/8 1/1 ceftriaxone >>1/8  Microbiology results: 1/5 WCx MRSA (S = Vanco, SMZ/TMP)  Thank you for allowing pharmacy to be a part of this patient's care.  Doreene Eland, PharmD, BCPS, BCIDP Work Cell: [phone] 11/24/2022 12:17 PM

## 2022-11-24 NOTE — Progress Notes (Signed)
Occupational Therapy Treatment Patient Details Name: IKEY OMARY MRN: [id number] DOB: 11-30-1960 Today's Date: 11/24/2022   History of present illness 62 year old male admitted to the medical unit (transfer from geri psych) found to have complication of his amputation site (R BKA 09/2022), followed by vascular. Pt is now s/p excisional debridement & drainage of abscess & application of wound vac on RLE residual limb on 11/14/22,  revision and closure of RLE residual limb 11/18/22. PMH significant for CAD s/p CABG, HFrEF with recovered EF, PVD, type 2 diabetes, COPD, hypertension, hyperlipidemia, hypothyroidism, depression   OT comments  Mr. Lacount continues to demonstrate progress in his ability to perform lateral scoot transfers. He was able to move recliner-bed with CGA-Min A. Mod I in rolling in bed, transitioning sit<>supine, and using UE to move self towards HOB in supine. CGA-Min A for UB dressing. Pt had a BM in recliner, requires Total A for pericare. Pt reports he is having no pain at R LE stump site, but does endorse mild pain in R UE, particularly in wrist area. Pt declines standing attempts today, citing this wrist pain. Pt states that he has now changed his mind re: DC and that he refuses to return to [facility], saying that he would prefer transferring to a homeless shelter.    Recommendations for follow up therapy are one component of a multi-disciplinary discharge planning process, led by the attending physician.  Recommendations may be updated based on patient status, additional functional criteria and insurance authorization.    Follow Up Recommendations  Skilled nursing-short term rehab (<3 hours/day)     Assistance Recommended at Discharge    Patient can return home with the following  A lot of help with walking and/or transfers;A lot of help with bathing/dressing/bathroom   Equipment Recommendations       Recommendations for Other Services      Precautions /  Restrictions Precautions Precautions: Fall Restrictions Weight Bearing Restrictions: Yes RLE Weight Bearing: Non weight bearing       Mobility Bed Mobility Overal bed mobility: Modified Independent Bed Mobility: Rolling, Sidelying to Sit, Sit to Supine Rolling: Modified independent (Device/Increase time) Sidelying to sit: Modified independent (Device/Increase time)   Sit to supine: Modified independent (Device/Increase time)   General bed mobility comments: uses rails and bed features but overall does well with bed mobility skills.    Transfers Overall transfer level: Needs assistance Equipment used: None Transfers: Bed to chair/wheelchair/BSC            Lateral/Scoot Transfers: Min assist       Balance Overall balance assessment: Needs assistance Sitting-balance support: Bilateral upper extremity supported Sitting balance-Leahy Scale: Good Sitting balance - Comments: no LOB while seated EOB     Standing balance-Leahy Scale: Zero                             ADL either performed or assessed with clinical judgement   ADL   Eating/Feeding: Modified independent;Sitting               Upper Body Dressing : Bed level;Supervision/safety           Toileting- Clothing Manipulation and Hygiene: Total assistance;Bed level Toileting - Clothing Manipulation Details (indicate cue type and reason): Max A for pericare post BM in recliner            Extremity/Trunk Assessment Upper Extremity Assessment Upper Extremity Assessment: Generalized weakness [name] Coordination: decreased fine  motor;decreased gross motor   Lower Extremity Assessment Lower Extremity Assessment: Generalized weakness RLE Deficits / Details: s/p complication of R BKA site        Vision       Perception     Praxis      Cognition Arousal/Alertness: Awake/alert Behavior During Therapy: WFL for tasks assessed/performed Overall Cognitive Status: Within Functional Limits  for tasks assessed                                          Exercises      Shoulder Instructions       General Comments      Pertinent Vitals/ Pain       Pain Assessment Pain Score: 2  Pain Location: [name] Pain Descriptors / Indicators: Aching Pain Intervention(s): Limited activity within patient's tolerance, Repositioned  Home Living                                          Prior Functioning/Environment              Frequency  Min 2X/week        Progress Toward Goals  OT Goals(current goals can now be found in the care plan section)  Progress towards OT goals: Progressing toward goals  Acute Rehab OT Goals OT Goal Formulation: With patient Time For Goal Achievement: 11/26/22 Potential to Achieve Goals: [place] Discharge plan remains appropriate;Frequency remains appropriate    Co-evaluation                 AM-PAC OT "6 Clicks" Daily Activity     Outcome Measure   Help from another person eating meals?: None Help from another person taking care of personal grooming?: A Little Help from another person toileting, which includes using toliet, bedpan, or urinal?: A Lot Help from another person bathing (including washing, rinsing, drying)?: A Lot Help from another person to put on and taking off regular upper body clothing?: A Little Help from another person to put on and taking off regular lower body clothing?: A Lot 6 Click Score: 16    End of Session    OT Visit Diagnosis: Unsteadiness on feet (R26.81);Muscle weakness (generalized) (M62.81);Pain   Activity Tolerance Patient tolerated treatment well   Patient Left in bed;with call bell/phone within reach;with bed alarm set;Other (comment) (w/ pt on bedpan)   Nurse Communication          Time: 6599-3570 OT Time Calculation (min): 16 min  Charges: OT General Charges $OT Visit: 1 Visit OT Treatments $Self Care/Home Management : 8-22 mins Josiah Lobo, PhD, MS, OTR/L 11/24/22, 3:53 PM

## 2022-11-24 NOTE — Progress Notes (Signed)
[facility] Progress Note  Patient: Shane Chambers    TIR:[id number]  DOA: 11/11/2022     Date of Service: the patient was seen and examined on 11/24/2022  No chief complaint on file.  Brief hospital course:  Shane Chambers is a 62 y.o. male with past medical history of CAD s/p CABG, HFrEF with recovered EF, PVD, type 2 diabetes, COPD, hypertension, hyperlipidemia, hypothyroidism, depression who is current he admitted for suicidal ideation and found to have complication of his amputation site.  Shane Chambers states that for the last 3 days, he has been experiencing increasing pain of his right BKA stump in addition to increasing erythema.  His nurse at bedside states that he has been picking at it. Patient has not noticed any purulent drainage at this time but there are areas where he is worried that the incision is opening. Per chart review, patient was admitted on 11/23 for cellulitis of the right lower extremity extending from the foot up towards the knee.  CT imaging at that time demonstrated holdable complex fluid collections and possible medial malleolus osteomyelitis.  MRI was subsequently obtained that demonstrated right ankle septic arthritis.  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  He followed up with orthopedic surgery on 12/14.  At that time, he was instructed to follow-up in 1 week for staple removal.   Assessment and Plan:  Right BKA stump abscess/MRSA wound infection, s/p revision and washout done x 2 on 11/14/2022 and 11/18/22 by Vasc Sx H/o Acute osteomyelitis, s/p right BKA Initially patient was seen in consultation at [facility], however required transfer to the medical unit due to inability to administer IV antibiotics.  Patient presented with 3 days of gradually worsening right stump erythema and pain.  Initial concern for cellulitis or only, however vascular surgery was able to put a probe down to the bone with 200 cc of purulent drainage, more consistent with  osteomyelitis. Inflammatory markers are elevated. Vascular surgery consulted, s/p revision of right BKA stump, washout of Abscess in the posterior portion of the below-knee amputation stump, Deep wound culture to microbiology  S/p ceftriaxone and metronidazole, which were discontinued by ID 1/8 wound culture growing MRSA, discontinued ceftriaxone.  Wound culture for 5 days Tylenol as needed for fever Started gabapentin, patient was taking 6000 mg p.o. twice daily changed oxycodone 5 mg p.o. every 4 hourly as needed WBC count trending down Wound care consulted 1/8 ID consulted for recommendation of antibiotics and duration of treatment  1/9 s/p Excisional debridement right below-knee amputation stump  1/10 seen by vascular surgery, recommended Dressing changed today.  Wet-to-dry's with gauze packing the wound covered with ABDs and wrapped in Kerlix.  1/12 DC'd IV vancomycin due to elevated functions and started on daptomycin by ID Follow vascular surgery and ID for discharge planning    Constipation, Resolved 1/12, x-ray abdomen shows moderate stool burden throughout the colon Patient was requesting enema, as per RN patient had multiple bowel movements but x-ray still shows moderate stool burden.  So we will go ahead and give soapsuds enema.  Decrease MiraLAX daily with holding parameters and Dulcolax nightly with holding parameters.   Elevated creatinine could be due to dehydration Continue to monitor renal.,  Patient is on vancomycin, pharmacy will adjust dose according to renal functions Started gentle hydration for few hours Hold off torsemide home dose 40 mg p.o. daily, decreased torsemide 20 mg p.o. daily to be started on 1/17 Wednesday if creatinine improves   Hypomagnesemia, mag  repleted.  Resolved Monitor and replete as needed.  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  EKG today with improved QTc.    Hypertension, continue amlodipine,  Lopressor  HLD, decrease Lipitor from 80-40 due to risk of rhabdomyolysis as patient is on daptomycin.  Dose can be increased back to 80 mg if needed after completion of antibiotics.  Depression - Continue SSRIs and antipsychotics started on recent [facility] admission  Continue Abilify and Effexor  Type 2 diabetes mellitus (HCC) - SSI, moderate - Hold home metformin  Hypothyroid, continued Synthroid BPH, continue Flomax Vitamin D insufficiency: started vitamin D 50,000 units p.o. weekly, follow with PCP to repeat vitamin D level after 3 to 6 months. 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  Recommended to follow with neurology for EMG and NCS test as an outpatient  BMI 36     Consultants:  Vascular surgery ID   Procedures:  1/5 s/p revision of right BKA stump, washout of Abscess in the posterior portion of the below-knee amputation stump, Deep wound culture to microbiology  1/9 Excisional debridement right below-knee amputation stump   Antimicrobials:  1/12 started Daptomycin S/p Vancomycin till 1/12  Pressure Injury 11/06/22 Buttocks Right Stage 2 -  Partial thickness loss of dermis presenting as a shallow open injury with a red, pink wound bed without slough. no odor. no drainage. no slough. 1cm X 0.3cm (Active)  11/06/22 1800  Location: Buttocks  Location Orientation: Right  Staging: Stage 2 -  Partial thickness loss of dermis presenting as a shallow open injury with a red, pink wound bed without slough.  Wound Description (Comments): no odor. no drainage. no slough. 1cm X 0.3cm  Present on Admission:   Dressing Type Foam - Lift dressing to assess site every shift 11/24/22 0800     Diet: Heart healthy/carb modified DVT Prophylaxis: Subcutaneous Lovenox    Advance goals of care discussion: Full code  Family Communication: family was NOT present at bedside, at the time of interview.  The pt provided permission to discuss medical plan with the family. Opportunity was given to ask question and all questions were answered satisfactorily.   Disposition:  Pt is from SNF, admitted with right BKA infection and depression with SI/HI ideations under BHU, still has infection, on IV antibiotics, s/p right BKA stump washout done on 1/5, second washout on 1/9 , wound culture growing MRSA, ID consulted for further recommendation, which precludes a safe discharge. Discharge to SNF, when cleared by vascular surgery and ID 1/11 as per [facility] patient is refusing to go back to SNF from where he came and has no offers from other SNF facilities.    Subjective: No significant events overnight, patient was just feeling tired, pain is well-controlled 3/10, no any other active issues. Patient was consulted to think about going to the same send facility, patient is thinking about it.   Physical Exam: General:  alert oriented to time, place, and person.  Appear in no distress, affect appropriate Eyes: PERRLA ENT: Oral Mucosa Clear, moist  Neck: no JVD,  Cardiovascular: S1 and S2 Present, no Murmur,  Respiratory: good respiratory effort, Bilateral Air entry equal and Decreased, no Crackles, no wheezes Abdomen: Bowel Sound present, Soft and no tenderness,  Skin: no rashes Extremities: no Pedal edema, no calf tenderness, s/p Right BKA stump washout, dressing CDI,  Neurologic: without any new focal findings Gait not checked due to patient safety concerns  Vitals:   11/23/22 2030 11/24/22 0424 11/24/22 0500 11/24/22 0739  BP: 131/67 (!) 151/61  138/62  Pulse: 61 71  73  Resp: '20 16  20  '$ Temp: 98.1 F (36.7 C) 97.7 F (36.5 C)  98 F (36.7 C)  TempSrc:  Oral  Oral  SpO2: 96% 97%  97%  Weight:   111.1 kg   Height:        Intake/Output Summary (Last 24  hours) at 11/24/2022 1227 Last data filed at 11/24/2022 0428 Gross per 24 hour  Intake --  Output 1950 ml  Net -1950 ml    Filed Weights   11/22/22 0500 11/23/22 0500 11/24/22 0500  Weight: 111.6 kg 101.8 kg 111.1 kg    Data Reviewed: I have personally reviewed and interpreted daily labs, tele strips, imagings as discussed above. I reviewed all nursing notes, pharmacy notes, vitals, pertinent old records I have discussed plan of care as described above with RN and patient/family.  CBC: Recent Labs  Lab 11/20/22 0400 11/21/22 0420 11/22/22 0603 11/23/22 0535 11/24/22 0440  WBC 12.5* 15.0* 10.6* 10.3 14.1*  HGB 7.9* 8.4* 7.7* 7.7* 9.1*  HCT 25.3* 26.9* 24.5* 24.4* 28.7*  MCV 92.3 93.4 92.5 92.8 91.1  PLT 335 346 308 306 579   Basic Metabolic Panel: Recent Labs  Lab 11/18/22 0418 11/19/22 0402 11/20/22 0400 11/21/22 0420 11/22/22 0603 11/23/22 0535 11/24/22 0440  NA 136 136 135 134* 138 134* 138  K 4.1 4.3 4.4 4.8 4.3 4.1 4.2  CL 101 102 101 99 104 102 104  CO2 '26 25 25 27 24 25 23  '$ GLUCOSE 158* 158* 142* 143* 114* 123* 140*  BUN 64* 64* 68* 78* 82* 80* 75*  CREATININE 1.11 1.07 1.22 1.42* 1.32* 1.24 1.25*  CALCIUM 8.9 8.8* 8.7* 8.8* 9.1 8.8* 9.2  MG 1.8 1.9 2.0 2.1  --   --   --   PHOS 3.3 4.4 5.1* 5.2*  --   --   --     Studies: No results found.  Scheduled Meds:  amLODipine  10 mg Oral Daily   ARIPiprazole  10 mg Oral Daily   vitamin C  500 mg Oral BID   aspirin EC  81 mg Oral Q breakfast   atorvastatin  40 mg Oral QHS   bisacodyl  10 mg Oral QHS   clotrimazole   Topical BID   enoxaparin (LOVENOX) injection  0.5 mg/kg Subcutaneous Q24H   ferrous gluconate  324 mg Oral Daily   fluticasone furoate-vilanterol  1 puff Inhalation Daily   gabapentin  600 mg Oral BID   insulin aspart  0-15 Units Subcutaneous TID AC & HS   levothyroxine  75 mcg Oral Q0600   magnesium oxide  400 mg Oral Daily   metoprolol tartrate  50 mg Oral BID   multivitamin with  minerals  1 tablet Oral Daily   pantoprazole  40 mg Oral Daily   polyethylene glycol  17 g Oral BID   polyvinyl alcohol  1 drop Both Eyes Daily   potassium chloride SA  20 mEq Oral Daily   Ensure Max Protein  11 oz Oral TID   sodium chloride flush  3 mL Intravenous Q12H   tamsulosin  0.8 mg Oral Daily   thiamine  100 mg Oral  Daily   [START ON 11/26/2022] torsemide  20 mg Oral Daily   venlafaxine XR  150 mg Oral Q breakfast   Vitamin D (Ergocalciferol)  50,000 Units Oral Q7 days   zinc sulfate  220 mg Oral Daily   Continuous Infusions:  DAPTOmycin (CUBICIN) 700 mg in sodium chloride 0.9 % IVPB 700 mg (11/23/22 2115)   PRN Meds: acetaminophen, albuterol, fentaNYL (SUBLIMAZE) injection, hydrALAZINE, HYDROmorphone (DILAUDID) injection, ipratropium-albuterol, loperamide HCl, ondansetron (ZOFRAN) IV, mouth rinse, oxyCODONE  Time spent: 50 minutes  Author: Val Riles. MD [facility] 11/24/2022 12:27 PM  To reach On-call, see care teams to locate the attending and reach out to them via [email or web address]. If 7PM-7AM, please contact night-coverage If you still have difficulty reaching the attending provider, please page the [facility] (Director on Call) for [facility] on amion for assistance.

## 2022-11-24 NOTE — Progress Notes (Addendum)
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 Pt is now s/p excisional debridement & drainage of abscess & application of wound vac on RLE residual limb on 11/14/22,  revision and closure of RLE residual limb 11/18/22. PMH significant for CAD s/p CABG, HFrEF with recovered EF, PVD, type 2 diabetes, COPD, hypertension, hyperlipidemia, hypothyroidism, depression    PT Comments    Pt sitting upright in bed.  Stated BM's are improved with no movement since last night.  Ready to get up to chair.  He transitions to the opposite side of bed where his chair is.  He does seem to struggle a bit more today with lateral scoot transfer taking several self initiated breaks and does eventually need mod a x 1 to fully get in recliner.  Cues for hand placements and techniques needed. Some BM smears are noted during transition unsure if they are old on sheets or new, he feels they are old, but pt opts to stay in chair at this time.  Manual resisted ex RLE 2 x 10.    Recommendations for follow up therapy are one component of a multi-disciplinary discharge planning process, led by the attending physician.  Recommendations may be updated based on patient status, additional functional criteria and insurance authorization.  Follow Up Recommendations  Skilled nursing-short term rehab (<3 hours/day)     Assistance Recommended at Discharge Frequent or constant Supervision/Assistance  Patient can return home with the following Two people to help with walking and/or transfers;A lot of help with bathing/dressing/bathroom;Assistance with cooking/housework;Direct supervision/assist for medications management;Direct supervision/assist for financial management;Assist for  transportation;Help with stairs or ramp for entrance   Equipment Recommendations       Recommendations for Other Services       Precautions / Restrictions Restrictions Weight Bearing Restrictions: Yes RLE Weight Bearing: Non weight bearing     Mobility  Bed Mobility Overal bed mobility: Modified Independent   Rolling: Modified independent (Device/Increase time) Sidelying to sit: Modified independent (Device/Increase time) Supine to sit: Modified independent (Device/Increase time) Sit to supine: Modified independent (Device/Increase time)   General bed mobility comments: uses rails and bed features but overall does well with bed mobility skills.    Transfers Overall transfer level: Needs assistance Equipment used: None Transfers: Bed to chair/wheelchair/BSC            Lateral/Scoot Transfers: Min assist, Mod assist General transfer comment: does need some increased assist today for lateral scoot transfers.  seems more fatigued but wanting to get up.    Ambulation/Gait                   Stairs             Wheelchair Mobility    Modified Rankin (Stroke Patients Only)       Balance Overall balance assessment: Needs assistance Sitting-balance support: Single extremity supported Sitting balance-Leahy Scale: Good Sitting balance - Comments: no LOB while seated EOB                                    Cognition Arousal/Alertness: Awake/alert Behavior During Therapy: WFL for tasks assessed/performed Overall Cognitive Status: Within Functional Limits for tasks assessed  Exercises Other Exercises Other Exercises: R LE 4 -way hip in sitting and R knee flex/ext.  manual resistance 2 x 10 with manual resistance    General Comments        Pertinent Vitals/Pain Pain Assessment Pain Assessment: Faces Faces Pain Scale: Hurts a little bit Pain Location: R residual limb Pain  Descriptors / Indicators: Aching, Grimacing Pain Intervention(s): Limited activity within patient's tolerance, Monitored during session, Repositioned    Home Living                          Prior Function            PT Goals (current goals can now be found in the care plan section) Progress towards PT goals: Progressing toward goals    Frequency    7X/week      PT Plan Current plan remains appropriate    Co-evaluation              AM-PAC PT "6 Clicks" Mobility   Outcome Measure    Help needed moving from lying on your back to sitting on the side of a flat bed without using bedrails?: A Little Help needed moving to and from a bed to a chair (including a wheelchair)?: A Little Help needed standing up from a chair using your arms (e.g., wheelchair or bedside chair)?: Total Help needed to walk in hospital room?: Total Help needed climbing 3-5 steps with a railing? : Total 6 Click Score: 9    End of Session   Activity Tolerance: Patient tolerated treatment well;Other (comment) Patient left: in chair;with call bell/phone within reach;with chair alarm set Nurse Communication: Mobility status PT Visit Diagnosis: Muscle weakness (generalized) (M62.81);Difficulty in walking, not elsewhere classified (R26.2);Other abnormalities of gait and mobility (R26.89)     Time: 9373-4287 PT Time Calculation (min) (ACUTE ONLY): 24 min  Charges:  $Therapeutic Exercise: 8-22 mins $Therapeutic Activity: 8-22 mins                   Chesley Noon, PTA 11/24/22, 1:29 PM

## 2022-11-25 ENCOUNTER — Inpatient Hospital Stay: Payer: Medicare Other

## 2022-11-25 LAB — BASIC METABOLIC PANEL
Anion gap: 9 (ref 5–15)
BUN: 84 mg/dL — ABNORMAL HIGH (ref 6–20)
CO2: 23 mmol/L (ref 22–32)
Calcium: 8.9 mg/dL (ref 8.9–10.3)
Chloride: 103 mmol/L (ref 98–111)
Creatinine, Ser: 1.23 mg/dL (ref 0.61–1.24)
GFR, Estimated: >60 mL/min (ref >60)
Glucose, Bld: 129 mg/dL — ABNORMAL HIGH (ref 70–99)
Potassium: 4.2 mmol/L (ref 3.5–5.1)
Sodium: 135 mmol/L (ref 135–145)

## 2022-11-25 LAB — CBC
HCT: 25.8 % — ABNORMAL LOW (ref 39.0–52.0)
Hemoglobin: 8.2 g/dL — ABNORMAL LOW (ref 13.0–17.0)
MCH: 29 pg (ref 26.0–34.0)
MCHC: 31.8 g/dL (ref 30.0–36.0)
MCV: 91.2 fL (ref 80.0–100.0)
Platelets: 302 10*3/uL (ref 150–400)
RBC: 2.83 MIL/uL — ABNORMAL LOW (ref 4.22–5.81)
RDW: 16.4 % — ABNORMAL HIGH (ref 11.5–15.5)
WBC: 12.1 10*3/uL — ABNORMAL HIGH (ref 4.0–10.5)
nRBC: 0 % (ref 0.0–0.2)

## 2022-11-25 LAB — CK: Total CK: 53 U/L (ref 49–397)

## 2022-11-25 LAB — BRAIN NATRIURETIC PEPTIDE: B Natriuretic Peptide: 695.7 pg/mL — ABNORMAL HIGH (ref 0.0–100.0)

## 2022-11-25 LAB — GLUCOSE, CAPILLARY
Glucose-Capillary: 129 mg/dL — ABNORMAL HIGH (ref 70–99)
Glucose-Capillary: 112 mg/dL — ABNORMAL HIGH (ref 70–99)
Glucose-Capillary: 117 mg/dL — ABNORMAL HIGH (ref 70–99)
Glucose-Capillary: 152 mg/dL — ABNORMAL HIGH (ref 70–99)

## 2022-11-25 LAB — MAGNESIUM: Magnesium: 2.4 mg/dL (ref 1.7–2.4)

## 2022-11-25 LAB — PHOSPHORUS: Phosphorus: 5.9 mg/dL — ABNORMAL HIGH (ref 2.5–4.6)

## 2022-11-25 MED ORDER — FUROSEMIDE 10 MG/ML IJ SOLN: 20.0000 mg | Freq: Once | INTRAMUSCULAR | Status: DC

## 2022-11-25 MED ORDER — FUROSEMIDE 10 MG/ML IJ SOLN
4.0000 mg/h | INTRAVENOUS | Status: DC
  Administered 2022-11-25: 4 mg/h via INTRAVENOUS
  Filled 2022-11-25: qty 20

## 2022-11-25 MED ORDER — FUROSEMIDE 10 MG/ML IJ SOLN
20.0000 mg | Freq: Once | INTRAMUSCULAR | Status: AC
  Administered 2022-11-25: 20 mg via INTRAVENOUS
  Filled 2022-11-25: qty 4

## 2022-11-25 NOTE — Progress Notes (Signed)
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 Pt is now s/p excisional debridement & drainage of abscess & application of wound vac on RLE residual limb on 11/14/22,  revision and closure of RLE residual limb 11/18/22. PMH significant for CAD s/p CABG, HFrEF with recovered EF, PVD, type 2 diabetes, COPD, hypertension, hyperlipidemia, hypothyroidism, depression   Clinical Impression   Mr. Veley reports he is not feeling well today, is SOB, now on 2L O2, w/O2 sats at 95%, L LE edema, has recently started IV lasix. He is speaking much more slowly than usual, states he has not gotten out of bed all day. Encouraged pt to sit up EOB and engage in grooming tasks, pt was eager to shampoo hair. He was able to do this w/ Mod I and maintained good sitting balance w/o any UE support on bed. Pt able to change sock on L foot with Mod I. Mod I bed mobility, w/ fluid and swift movements. Discussed DC recs, letting pt know he would initially be going into SNF section of [place], with his agreeing that he would be willing to give that a try but that he would want to come up with an alternative housing possibility prior to moving to [place]. Will continue to follow [facility]. Encouraged pt to participate in transfers/OOB movement tomorrow; he verbalized agreement. Goals of care reviewed and updated.   Recommendations for follow up therapy are one component of a multi-disciplinary discharge planning process, led by the attending physician.  Recommendations may be updated based on patient status, additional functional criteria and insurance authorization.   Follow Up Recommendations  Skilled nursing-short term rehab (<3 hours/day)     Assistance  Recommended at Discharge Frequent or constant Supervision/Assistance  Patient can return home with the following A lot of help with walking and/or transfers;A lot of help with bathing/dressing/bathroom    Functional Status Assessment     Equipment Recommendations       Recommendations for Other Services       Precautions / Restrictions Precautions Precautions: Fall      Mobility Bed Mobility Overal bed mobility: Modified Independent Bed Mobility: Rolling, Sidelying to Sit, Sit to Supine Rolling: Modified independent (Device/Increase time)   Supine to sit: Modified independent (Device/Increase time) Sit to supine: Modified independent (Device/Increase time)   General bed mobility comments: uses rails and bed features but overall does well with bed mobility skills.    Transfers                          Balance Overall balance assessment: Needs assistance Sitting-balance support: No upper extremity supported Sitting balance-Leahy Scale: Good Sitting balance - Comments: no LOB while seated EOB, no UE support     Standing balance-Leahy Scale: Zero                             ADL either performed or assessed with clinical judgement   ADL Overall ADL's : Needs assistance/impaired Eating/Feeding: Modified independent;Sitting   Grooming: Brushing hair;Wash/dry face;Wash/dry hands;Sitting;Modified independent;Set up               Lower Body Dressing: Modified independent Lower Body Dressing Details (indicate cue  type and reason): changing L sock               General ADL Comments: SET UP for grooming tasks (washing hair, washing face, brushing teeth), MIN A for UB dressing     Vision         Perception     Praxis      Pertinent Vitals/Pain Pain Assessment Pain Assessment: No/denies pain     Hand Dominance     Extremity/Trunk Assessment Upper Extremity Assessment Upper Extremity Assessment: Generalized weakness [name]  Deficits / Details: Thenar and general UE atrophy visually apparent. R UE weaker than L.  Does not show great grip or tricep b/l (R grossly 3+/5, L -4/5), lacks ability to fully open/extend fingers. [name] Coordination: decreased fine motor;decreased gross motor   Lower Extremity Assessment RLE Deficits / Details: s/p complication of R BKA site       Communication     Cognition Arousal/Alertness: Awake/alert Behavior During Therapy: WFL for tasks assessed/performed Overall Cognitive Status: Within Functional Limits for tasks assessed                                       General Comments  O2 sats 95% on 2L O2 [place]    Exercises Other Exercises Other Exercises: Educ re: DC recs   Shoulder Instructions      Home Living                                          Prior Functioning/Environment                          OT Problem List:        OT Treatment/Interventions:      OT Goals(Current goals can be found in the care plan section) Acute Rehab OT Goals OT Goal Formulation: With patient Time For Goal Achievement: 12/09/22 Potential to Achieve Goals: Fair  OT Frequency: Min 2X/week    Co-evaluation              AM-PAC OT "6 Clicks" Daily Activity     Outcome Measure Help from another person eating meals?: None Help from another person taking care of personal grooming?: A Little Help from another person toileting, which includes using toliet, bedpan, or urinal?: A Lot Help from another person bathing (including washing, rinsing, drying)?: A Lot Help from another person to put on and taking off regular upper body clothing?: A Little Help from another person to put on and taking off regular lower body clothing?: A Little 6 Click Score: 17   End of Session    Activity Tolerance: Patient tolerated treatment well Patient left: in bed;with call bell/phone within reach  OT Visit Diagnosis: Unsteadiness on feet (R26.81);Muscle  weakness (generalized) (M62.81);Pain                Time: 8527-7824 OT Time Calculation (min): 18 min Charges:  OT General Charges $OT Visit: 1 Visit OT Evaluation $OT Re-eval: 1 Re-eval OT Treatments $Self Care/Home Management : 8-22 mins Josiah Lobo, PhD, MS, OTR/L 11/25/22, 4:00 PM

## 2022-11-25 NOTE — Progress Notes (Signed)
Nutrition Follow Up Note   DOCUMENTATION CODES:   Obesity unspecified  INTERVENTION:   Recommend NGT and nutrition support if patient continues to have poor oral intake   Ensure Max protein supplement TID, each supplement provides 150kcal and 30g of protein.  MVI po daily   Vitamin C '500mg'$  po BID  Zinc '220mg'$  po daily x 14 days  NUTRITION DIAGNOSIS:   Increased nutrient needs related to wound healing as evidenced by estimated needs. 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  Pt s/p I & D 1/9  Pt with good appetite and oral intake at the beginning of his admission; pt was eating 100% of most meals and is drinking the Ensure Max. Today, pt reports that he feels terrible. Pt is more lethargic than previous visits and reports that he does not want any food or Ensure. Pt's lunch tray was sitting on his side table untouched. CXR consistent with pulmonary edema and pleural effusion; lasix initiated. If pt continues to have poor oral intake, pt will need NGT and tube feeds to support wound healing. Per chart, pt is weight stable since admission. RD will monitor pt's oral intake.   Medications reviewed and include: vitamin C, aspirin, dulcolax, lovenox, ferrous gluconate, insulin, synthroid, Mg oxide, MVI, protonix, KCl, thiamine, lasix, D2, zinc, daptomycin   Labs reviewed: K 4.2 wnl, BUN 84(H), P 5.9(H), Mg 2.4 wnl BNP- 695(H) Vitamin D 28.25(L), vitamin A 70.2(H), vitamin C 1.4 wnl, vitamin E 9.7 wnl, zinc 52 wnl, copper 111 wnl- 1/4 WBC- 12.1(H), Hgb 8.2(L), Hct 25.8(L) Cbgs- 117, 112, 129 x 24 hrs   Diet Order:   Diet Order             Diet Carb  Modified Fluid consistency: Thin; Room service appropriate? Yes  Diet effective now                  EDUCATION NEEDS:   Education needs have been addressed  Skin:  Skin Assessment: Reviewed RN Assessment (Stage II buttocks, incision R leg s/p recent BKA)  Last BM:  1/15- type 6  Height:   Ht Readings from Last 1 Encounters:  11/18/22 '5\' 9"'$  (1.753 m)    Weight:   Wt Readings from Last 1 Encounters:  11/24/22 111.1 kg    Ideal Body Weight:  72.7 kg  Estimated Nutritional Needs:   Kcal:  2100-2400kcal/day  Protein:  105-120g/day  Fluid:  1.8-2.1L/day  Koleen Distance MS, RD, LDN Please refer to [facility] for RD and/or RD on-call/weekend/after hours pager

## 2022-11-25 NOTE — Progress Notes (Signed)
PT Cancellation Note  Patient Details Name: Shane Chambers MRN: [id number] DOB: 1961/07/04   Cancelled Treatment:    Reason Eval/Treat Not Completed: Fatigue/lethargy limiting ability to participate  Pt asleep this am and did not awaken to voice.  Returned after lunch. Pt supine leaning against bed rail.  Appears drowsy stating he is not feeling well and relates it to his breathing.  Stated sats are ok.  Does not feel up to therapy today.  Will defer session.  SNF remains recommended for pt.   Chesley Noon 11/25/2022, 1:19 PM

## 2022-11-25 NOTE — Progress Notes (Signed)
Patient having dyspnea at rest, but denies SOB or chest pain. RN observed increased WOB during morning assessment. Patient states, "He just doesn't feel well today." Patient has poor appetite. Very lethargic. Alerted Dr. Dwyane Dee. Pending CXR.   11/25/22 0919  Vitals  Temp 97.8 F (36.6 C)  Temp Source Oral  BP (!) 152/68  MAP (mmHg) 91  BP Location Right Arm  BP Method Automatic  Patient Position (if appropriate) Lying  Pulse Rate 70  Pulse Rate Source Monitor  Resp 18  MEWS COLOR  MEWS Score Color Green  Oxygen Therapy  SpO2 97 %  O2 Device Nasal Cannula  O2 Flow Rate (L/min) 2 L/min  MEWS Score  MEWS Temp 0  MEWS Systolic 0  MEWS Pulse 0  MEWS RR 0  MEWS LOC 0  MEWS Score 0

## 2022-11-25 NOTE — Progress Notes (Signed)
[facility] Progress Note  Patient: Shane Chambers    YBW:[id number]  DOA: 11/11/2022     Date of Service: the patient was seen and examined on 11/25/2022  No chief complaint on file.  Brief hospital course:  Shane Chambers is a 62 y.o. male with past medical history of CAD s/p CABG, HFrEF with recovered EF, PVD, type 2 diabetes, COPD, hypertension, hyperlipidemia, hypothyroidism, depression who is current he admitted for suicidal ideation and found to have complication of his amputation site.  Shane Chambers states that for the last 3 days, he has been experiencing increasing pain of his right BKA stump in addition to increasing erythema.  His nurse at bedside states that he has been picking at it. Patient has not noticed any purulent drainage at this time but there are areas where he is worried that the incision is opening. Per chart review, patient was admitted on 11/23 for cellulitis of the right lower extremity extending from the foot up towards the knee.  CT imaging at that time demonstrated holdable complex fluid collections and possible medial malleolus osteomyelitis.  MRI was subsequently obtained that demonstrated right ankle septic arthritis.  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  He followed up with orthopedic surgery on 12/14.  At that time, he was instructed to follow-up in 1 week for staple removal.   Assessment and Plan:  Right BKA stump abscess/MRSA wound infection, s/p revision and washout done x 2 on 11/14/2022 and 11/18/22 by Vasc Sx H/o Acute osteomyelitis, s/p right BKA Initially patient was seen in consultation at [facility], however required transfer to the medical unit due to inability to administer IV antibiotics.  Patient presented with 3 days of gradually worsening right stump erythema and pain.  Initial concern for cellulitis or only, however vascular surgery was able to put a probe down to the bone with 200 cc of purulent drainage, more consistent with  osteomyelitis. Inflammatory markers are elevated. Vascular surgery consulted, s/p revision of right BKA stump, washout of Abscess in the posterior portion of the below-knee amputation stump, Deep wound culture to microbiology  S/p ceftriaxone and metronidazole, which were discontinued by ID 1/8 wound culture growing MRSA, discontinued ceftriaxone.  Wound culture for 5 days Tylenol as needed for fever Started gabapentin, patient was taking 6000 mg p.o. twice daily changed oxycodone 5 mg p.o. every 4 hourly as needed WBC count trending down Wound care consulted 1/8 ID consulted for recommendation of antibiotics and duration of treatment  1/9 s/p Excisional debridement right below-knee amputation stump  1/10 seen by vascular surgery, recommended Dressing changed today.  Wet-to-dry's with gauze packing the wound covered with ABDs and wrapped in Kerlix.  1/12 DC'd IV vancomycin due to elevated functions and started on daptomycin by ID Follow vascular surgery and ID for discharge planning    # Acute hyper respiratory failure developed on 1/16 # Pulmonary edema, complaining of shortness of breath on 1/16 CXR consistent with pulmonary edema and pleural effusion Started Lasix IV 20 mg x 1 dose followed by Lasix IV infusion  Urine output renal functions Continue supplemental O2 inhalation and gradually wean off   # Constipation, Resolved 1/12, x-ray abdomen shows moderate stool burden throughout the colon Patient was requesting enema, as per RN patient had multiple bowel movements but x-ray still shows moderate stool burden.  So we will go ahead and give soapsuds enema.  Decrease MiraLAX daily with holding parameters and Dulcolax nightly with holding parameters.   Elevated creatinine could  be due to dehydration Continue to monitor renal.,  Patient is on vancomycin, pharmacy will adjust dose according to renal functions Started gentle hydration for few hours Hold off torsemide home dose 40 mg  p.o. daily,  1/16 started Lasix IV gtt, will monitor renal functions daily.  Hypomagnesemia, mag repleted.  Resolved Monitor and replete as needed.  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  EKG today with improved QTc.    Hypertension, continue amlodipine, Lopressor  HLD, decrease Lipitor from 80-40 due to risk of rhabdomyolysis as patient is on daptomycin.  Dose can be increased back to 80 mg if needed after completion of antibiotics.  Depression - Continue SSRIs and antipsychotics started on recent [facility] admission  Continue Abilify and Effexor  Type 2 diabetes mellitus (HCC) - SSI, moderate - Hold home metformin  Hypothyroid, continued Synthroid BPH, continue Flomax Vitamin D insufficiency: started vitamin D 50,000 units p.o. weekly, follow with PCP to repeat vitamin D level after 3 to 6 months. 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  Recommended to follow with neurology for EMG and NCS test as an outpatient  BMI 36     Consultants:  Vascular surgery ID Psych   Procedures:  1/5 s/p revision of right BKA stump, washout of Abscess in the posterior portion of the below-knee amputation stump, Deep wound culture to microbiology  1/9 Excisional debridement right below-knee amputation stump   Antimicrobials:  1/12 started Daptomycin S/p Vancomycin till 1/12  Pressure Injury 11/06/22 Buttocks Right Stage 2 -  Partial thickness loss of dermis presenting as a shallow open injury with a red, pink wound bed without slough. no odor. no drainage. no slough. 1cm X 0.3cm (Active)  11/06/22 1800  Location: Buttocks  Location Orientation: Right  Staging: Stage 2 -  Partial thickness loss of dermis presenting as a shallow open  injury with a red, pink wound bed without slough.  Wound Description (Comments): no odor. no drainage. no slough. 1cm X 0.3cm  Present on Admission:   Dressing Type Foam - Lift dressing to assess site every shift 11/24/22 0800     Diet: Heart healthy/carb modified DVT Prophylaxis: Subcutaneous Lovenox   Advance goals of care discussion: Full code  Family Communication: family was NOT present at bedside, at the time of interview.  The pt provided permission to discuss medical plan with the family. Opportunity was given to ask question and all questions were answered satisfactorily.   Disposition:  Pt is from SNF, admitted with right BKA infection and depression with SI/HI ideations under [facility], still has infection, on IV antibiotics, s/p right BKA stump washout done on 1/5, second washout on 1/9 , wound culture growing MRSA, ID consulted for further recommendation, which precludes a safe discharge. Discharge to SNF, when cleared by vascular surgery and ID 1/11 as per [facility] patient is refusing to go back to SNF from where he came and has no offers from other SNF facilities.    Subjective: No significant events overnight, since morning time patient was noticed to have significant shortness of breath, requiring supplemental O2 admission via nasal cannula.  Patient says that he cannot breathe well, unable to describe, denies any chest pain or chest pressure, no palpitations.  Denies any abdominal  pain, no nausea or Vomiting.   Physical Exam: General:  alert oriented to time, place, and person.  Appear in mild distress, affect appropriate Eyes: PERRLA ENT: Oral Mucosa Clear, moist  Neck: no JVD,  Cardiovascular: S1 and S2 Present, no Murmur,  Respiratory: good respiratory effort, Bilateral Air entry equal and Decreased, mild bilateral crackles, no wheezes Abdomen: Bowel Sound present, Soft and no tenderness,  Skin: no rashes Extremities: Mild edema of left lower extremity, no calf tenderness,  s/p Right BKA stump washout, dressing CDI,  Neurologic: without any new focal findings Gait not checked due to patient safety concerns  Vitals:   11/25/22 0350 11/25/22 0408 11/25/22 0731 11/25/22 0919  BP:  (!) 140/61 (!) 149/66 (!) 152/68  Pulse:  66 72 70  Resp:  (!) '21 18 18  '$ Temp:  98.2 F (36.8 C) 98.5 F (36.9 C) 97.8 F (36.6 C)  TempSrc:  Oral Oral Oral  SpO2: 94% 96% 97% 97%  Weight:      Height:        Intake/Output Summary (Last 24 hours) at 11/25/2022 1414 Last data filed at 11/25/2022 0919 Gross per 24 hour  Intake 0 ml  Output 1400 ml  Net -1400 ml    Filed Weights   11/22/22 0500 11/23/22 0500 11/24/22 0500  Weight: 111.6 kg 101.8 kg 111.1 kg    Data Reviewed: I have personally reviewed and interpreted daily labs, tele strips, imagings as discussed above. I reviewed all nursing notes, pharmacy notes, vitals, pertinent old records I have discussed plan of care as described above with RN and patient/family.  CBC: Recent Labs  Lab 11/21/22 0420 11/22/22 0603 11/23/22 0535 11/24/22 0440 11/25/22 0508  WBC 15.0* 10.6* 10.3 14.1* 12.1*  HGB 8.4* 7.7* 7.7* 9.1* 8.2*  HCT 26.9* 24.5* 24.4* 28.7* 25.8*  MCV 93.4 92.5 92.8 91.1 91.2  PLT 346 308 306 349 836   Basic Metabolic Panel: Recent Labs  Lab 11/19/22 0402 11/20/22 0400 11/21/22 0420 11/22/22 0603 11/23/22 0535 11/24/22 0440 11/25/22 0508  NA 136 135 134* 138 134* 138 135  K 4.3 4.4 4.8 4.3 4.1 4.2 4.2  CL 102 101 99 104 102 104 103  CO2 '25 25 27 24 25 23 23  '$ GLUCOSE 158* 142* 143* 114* 123* 140* 129*  BUN 64* 68* 78* 82* 80* 75* 84*  CREATININE 1.07 1.22 1.42* 1.32* 1.24 1.25* 1.23  CALCIUM 8.8* 8.7* 8.8* 9.1 8.8* 9.2 8.9  MG 1.9 2.0 2.1  --   --   --  2.4  PHOS 4.4 5.1* 5.2*  --   --   --  5.9*    Studies: DG Chest Port 1 View  Result Date: 11/25/2022 CLINICAL DATA:  Shortness of breath. EXAM: PORTABLE CHEST 1 VIEW COMPARISON:  10/12/2022 FINDINGS: Previous median sternotomy.  Interval removal of feeding tube. Stable cardiac enlargement. Unchanged asymmetric elevation of the right hemidiaphragm. Mild diffuse interstitial edema noted. No definite pleural effusion or airspace disease. IMPRESSION: 1. Mild diffuse interstitial edema. 2. Interval removal of feeding tube. Electronically Signed   By: Kerby Moors M.D.   On: 11/25/2022 10:06    Scheduled Meds:  amLODipine  10 mg Oral Daily   ARIPiprazole  10 mg Oral Daily   vitamin C  500 mg Oral BID   aspirin EC  81 mg Oral Q breakfast   atorvastatin  40 mg Oral QHS   bisacodyl  10 mg Oral QHS   clotrimazole   Topical BID  enoxaparin (LOVENOX) injection  0.5 mg/kg Subcutaneous Q24H   ferrous gluconate  324 mg Oral Daily   fluticasone furoate-vilanterol  1 puff Inhalation Daily   gabapentin  600 mg Oral BID   insulin aspart  0-15 Units Subcutaneous TID AC & HS   levothyroxine  75 mcg Oral Q0600   magnesium oxide  400 mg Oral Daily   metoprolol tartrate  50 mg Oral BID   multivitamin with minerals  1 tablet Oral Daily   pantoprazole  40 mg Oral Daily   polyethylene glycol  17 g Oral Daily   polyvinyl alcohol  1 drop Both Eyes Daily   potassium chloride SA  20 mEq Oral Daily   Ensure Max Protein  11 oz Oral TID   sodium chloride flush  3 mL Intravenous Q12H   tamsulosin  0.8 mg Oral Daily   thiamine  100 mg Oral Daily   venlafaxine XR  150 mg Oral Q breakfast   Vitamin D (Ergocalciferol)  50,000 Units Oral Q7 days   zinc sulfate  220 mg Oral Daily   Continuous Infusions:  DAPTOmycin (CUBICIN) 700 mg in sodium chloride 0.9 % IVPB 128 mL/hr at 11/25/22 0737   furosemide (LASIX) 200 mg in dextrose 5 % 100 mL (2 mg/mL) infusion     PRN Meds: acetaminophen, albuterol, fentaNYL (SUBLIMAZE) injection, hydrALAZINE, HYDROmorphone (DILAUDID) injection, ipratropium-albuterol, loperamide HCl, ondansetron (ZOFRAN) IV, mouth rinse, oxyCODONE  Time spent: 50 minutes  Author: Val Riles. MD [facility] 11/25/2022  2:14 PM  To reach On-call, see care teams to locate the attending and reach out to them via [email or web address]. If 7PM-7AM, please contact night-coverage If you still have difficulty reaching the attending provider, please page the [facility] (Director on Call) for [facility] on amion for assistance.

## 2022-11-25 NOTE — Plan of Care (Signed)
  Problem: Pain Managment: Goal: General experience of comfort will improve Outcome: Progressing   Problem: Safety: Goal: Ability to remain free from injury will improve Outcome: Progressing   Problem: Skin Integrity: Goal: Risk for impaired skin integrity will decrease Outcome: Progressing

## 2022-11-25 NOTE — Progress Notes (Signed)
   Date of Admission:  11/11/2022    ID: Shane Chambers is a 62 y.o. male Principal Problem:   Acute osteomyelitis ([place]) Active Problems:   Type 2 diabetes mellitus (HCC)   Depression   QT prolongation   MRSA infection   Amputation stump infection ([place])  Pt had Sob yesterday but better today after getting IV toresemide  Medications:   amLODipine  10 mg Oral Daily   ARIPiprazole  10 mg Oral Daily   vitamin C  500 mg Oral BID   aspirin EC  81 mg Oral Q breakfast   atorvastatin  40 mg Oral QHS   bisacodyl  10 mg Oral QHS   clotrimazole   Topical BID   enoxaparin (LOVENOX) injection  0.5 mg/kg Subcutaneous Q24H   ferrous gluconate  324 mg Oral Daily   fluticasone furoate-vilanterol  1 puff Inhalation Daily   gabapentin  600 mg Oral BID   insulin aspart  0-15 Units Subcutaneous TID AC & HS   levothyroxine  75 mcg Oral Q0600   magnesium oxide  400 mg Oral Daily   metoprolol tartrate  50 mg Oral BID   multivitamin with minerals  1 tablet Oral Daily   pantoprazole  40 mg Oral Daily   polyethylene glycol  17 g Oral Daily   polyvinyl alcohol  1 drop Both Eyes Daily   potassium chloride SA  20 mEq Oral Daily   Ensure Max Protein  11 oz Oral TID   sodium chloride flush  3 mL Intravenous Q12H   tamsulosin  0.8 mg Oral Daily   thiamine  100 mg Oral Daily   venlafaxine XR  150 mg Oral Q breakfast   Vitamin D (Ergocalciferol)  50,000 Units Oral Q7 days   zinc sulfate  220 mg Oral Daily    Objective: Vital signs in last 24 hours: Temp:  [97.8 F (36.6 C)-98.5 F (36.9 C)] 97.8 F (36.6 C) (01/16 0919) Pulse Rate:  [65-72] 70 (01/16 0919) Resp:  [18-26] 18 (01/16 0919) BP: (133-152)/(57-68) 152/68 (01/16 0919) SpO2:  [94 %-99 %] 97 % (01/16 0919)    PHYSICAL EXAM:  General: Alert, cooperative, no distress .  Lungs: b/l basal crepts Heart: Regular rate and rhythm, 3/6 systolic murmur Abdomen: Soft, non-tender,not distended. Bowel sounds normal. No masses Extremities:  RT  BKA Stump examined           Still has some slough/yellow tissue Skin: No rashes or lesions. Or bruising Lymph: Cervical, supraclavicular normal. Neurologic: Grossly non-focal  Lab Results Recent Labs    11/24/22 0440 11/25/22 0508  WBC 14.1* 12.1*  HGB 9.1* 8.2*  HCT 28.7* 25.8*  NA 138 135  K 4.2 4.2  CL 104 103  CO2 23 23  BUN 75* 84*  CREATININE 1.25* 1.23   Microbiology: Stump culture MRSA    Assessment/Plan: MRSA wound infection of the Rt BKA stump- S/p debridement and wound vac placement-but now wound vac has been removed-  On dapto while on dapto because of risk of rhabdo- reduced statin to '40mg'$   Need wound care consult to optimize topical care to help with healing May be able to do PO antibiotic like bactrim on discharge  AKI- because of diarrhea, volume depeletion along with Torsemide resolved  DM on insulin   HTN on amlodipine, metoprolol and torsemide   Depression/Anxiety- on Venlafaxine and aripiprazole   Discussed the management with the patient and care team

## 2022-11-26 LAB — BASIC METABOLIC PANEL
Anion gap: 8 (ref 5–15)
BUN: 82 mg/dL — ABNORMAL HIGH (ref 6–20)
CO2: 23 mmol/L (ref 22–32)
Calcium: 9.1 mg/dL (ref 8.9–10.3)
Chloride: 104 mmol/L (ref 98–111)
Creatinine, Ser: 1.08 mg/dL (ref 0.61–1.24)
GFR, Estimated: >60 mL/min (ref >60)
Glucose, Bld: 110 mg/dL — ABNORMAL HIGH (ref 70–99)
Potassium: 3.9 mmol/L (ref 3.5–5.1)
Sodium: 135 mmol/L (ref 135–145)

## 2022-11-26 LAB — CBC
HCT: 23.5 % — ABNORMAL LOW (ref 39.0–52.0)
Hemoglobin: 7.5 g/dL — ABNORMAL LOW (ref 13.0–17.0)
MCH: 29.2 pg (ref 26.0–34.0)
MCHC: 31.9 g/dL (ref 30.0–36.0)
MCV: 91.4 fL (ref 80.0–100.0)
Platelets: 289 10*3/uL (ref 150–400)
RBC: 2.57 MIL/uL — ABNORMAL LOW (ref 4.22–5.81)
RDW: 15.8 % — ABNORMAL HIGH (ref 11.5–15.5)
WBC: 9.5 10*3/uL (ref 4.0–10.5)
nRBC: 0 % (ref 0.0–0.2)

## 2022-11-26 LAB — GLUCOSE, CAPILLARY
Glucose-Capillary: 115 mg/dL — ABNORMAL HIGH (ref 70–99)
Glucose-Capillary: 169 mg/dL — ABNORMAL HIGH (ref 70–99)
Glucose-Capillary: 138 mg/dL — ABNORMAL HIGH (ref 70–99)
Glucose-Capillary: 159 mg/dL — ABNORMAL HIGH (ref 70–99)

## 2022-11-26 LAB — MAGNESIUM: Magnesium: 2.2 mg/dL (ref 1.7–2.4)

## 2022-11-26 LAB — PHOSPHORUS: Phosphorus: 5.9 mg/dL — ABNORMAL HIGH (ref 2.5–4.6)

## 2022-11-26 NOTE — Plan of Care (Signed)
  Problem: Clinical Measurements: Goal: Respiratory complications will improve Outcome: Progressing

## 2022-11-26 NOTE — Progress Notes (Signed)
[facility] Progress Note  Patient: Shane Chambers    QPR:[id number]  DOA: 11/11/2022     Date of Service: the patient was seen and examined on 11/26/2022  No chief complaint on file.  Brief hospital course:  Shane Chambers is a 62 y.o. male with past medical history of CAD s/p CABG, HFrEF with recovered EF, PVD, type 2 diabetes, COPD, hypertension, hyperlipidemia, hypothyroidism, depression who is current he admitted for suicidal ideation and found to have complication of his amputation site.  Shane Chambers states that for the last 3 days, he has been experiencing increasing pain of his right BKA stump in addition to increasing erythema.  His nurse at bedside states that he has been picking at it. Patient has not noticed any purulent drainage at this time but there are areas where he is worried that the incision is opening. Per chart review, patient was admitted on 11/23 for cellulitis of the right lower extremity extending from the foot up towards the knee.  CT imaging at that time demonstrated holdable complex fluid collections and possible medial malleolus osteomyelitis.  MRI was subsequently obtained that demonstrated right ankle septic arthritis.  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  He followed up with orthopedic surgery on 12/14.  At that time, he was instructed to follow-up in 1 week for staple removal.   Assessment and Plan:  Right BKA stump abscess/MRSA wound infection, s/p revision and washout done x 2 on 11/14/2022 and 11/18/22 by Vasc Sx H/o Acute osteomyelitis, s/p right BKA Initially patient was seen in consultation at [facility], however required transfer to the medical unit due to inability to administer IV antibiotics.  Patient presented with 3 days of gradually worsening right stump erythema and pain.  Initial concern for cellulitis or only, however vascular surgery was able to put a probe down to the bone with 200 cc of purulent drainage, more consistent with  osteomyelitis. Inflammatory markers are elevated. Vascular surgery consulted, s/p revision of right BKA stump, washout of Abscess in the posterior portion of the below-knee amputation stump, Deep wound culture to microbiology  S/p ceftriaxone and metronidazole, which were discontinued by ID 1/8 wound culture growing MRSA, discontinued ceftriaxone.  Wound culture for 5 days Tylenol as needed for fever Started gabapentin, patient was taking 6000 mg p.o. twice daily changed oxycodone 5 mg p.o. every 4 hourly as needed WBC count trending down Wound care consulted 1/8 ID consulted for recommendation of antibiotics and duration of treatment  1/9 s/p Excisional debridement right below-knee amputation stump  1/10 seen by vascular surgery, recommended Dressing changed today.  Wet-to-dry's with gauze packing the wound covered with ABDs and wrapped in Kerlix.  1/12 DC'd IV vancomycin due to elevated functions and started on daptomycin by ID Follow vascular surgery and ID for discharge planning    # Acute hyper respiratory failure developed on 1/16 # Pulmonary edema, complaining of shortness of breath on 1/16 CXR consistent with pulmonary edema and pleural effusion Started Lasix IV 20 mg x 1 dose followed by Lasix IV infusion  Urine output renal functions Continue supplemental O2 inhalation and gradually wean off   # Constipation, Resolved 1/12, x-ray abdomen shows moderate stool burden throughout the colon Patient was requesting enema, as per RN patient had multiple bowel movements but x-ray still shows moderate stool burden.  So we will go ahead and give soapsuds enema.  Decrease MiraLAX daily with holding parameters and Dulcolax nightly with holding parameters.   Elevated creatinine could  be due to dehydration Continue to monitor renal.,  Patient is on vancomycin, pharmacy will adjust dose according to renal functions Started gentle hydration for few hours Hold off torsemide home dose 40 mg  p.o. daily,  1/16 started Lasix IV gtt, will monitor renal functions daily.  Hypomagnesemia, mag repleted.  Resolved Monitor and replete as needed.  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  EKG today with improved QTc.    Hypertension, continue amlodipine, Lopressor  HLD, decrease Lipitor from 80-40 due to risk of rhabdomyolysis as patient is on daptomycin.  Dose can be increased back to 80 mg if needed after completion of antibiotics.  Depression - Continue SSRIs and antipsychotics started on recent [place] [facility] admission  Continue Abilify and Effexor  Type 2 diabetes mellitus (HCC) - SSI, moderate - Hold home metformin  Hypothyroid, continued Synthroid BPH, continue Flomax Vitamin D insufficiency: started vitamin D 50,000 units p.o. weekly, follow with PCP to repeat vitamin D level after 3 to 6 months.  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  Recommended to follow with neurology for EMG and NCS test as an outpatient  BMI 36     Consultants:  Vascular surgery ID Psych   Procedures:  1/5 s/p revision of right BKA stump, washout of Abscess in the posterior portion of the below-knee amputation stump, Deep wound culture to microbiology  1/9 Excisional debridement right below-knee amputation stump   Antimicrobials:  1/12 started Daptomycin S/p Vancomycin till 1/12  Pressure Injury 11/06/22 Buttocks Right Stage 2 -  Partial thickness loss of dermis presenting as a shallow open injury with a red, pink wound bed without slough. no odor. no drainage. no slough. 1cm X 0.3cm (Active)  11/06/22 1800  Location: Buttocks  Location Orientation: Right   Staging: Stage 2 -  Partial thickness loss of dermis presenting as a shallow open injury with a red, pink wound bed without slough.  Wound Description (Comments): no odor. no drainage. no slough. 1cm X 0.3cm  Present on Admission:   Dressing Type Foam - Lift dressing to assess site every shift 11/26/22 0859     Diet: Heart healthy/carb modified DVT Prophylaxis: Subcutaneous Lovenox   Advance goals of care discussion: Full code  Family Communication: family was NOT present at bedside, at the time of interview.  The pt provided permission to discuss medical plan with the family. Opportunity was given to ask question and all questions were answered satisfactorily.   Disposition:  Pt is from SNF, admitted with right BKA infection and depression with SI/HI ideations under BHU, still has infection, on IV antibiotics, s/p right BKA stump washout done on 1/5, second washout on 1/9 , wound culture growing MRSA, ID consulted for further recommendation, which precludes a safe discharge. Discharge to SNF, when cleared by vascular surgery and ID 1/17 discussed with patient and counseled him, he agreed to go to the same facility for SNF, informed to [facility], we can plan to discharge him in 1 to 2 days.   Subjective: No significant events overnight, patient feels improvement in the shortness of breath, he feels a lot better as compared to yesterday.  Still requiring  supplemental O2 nation but feeling better.  Denies any chest pain or palpitation, pain is under control.  No any other active issues.   Physical Exam: General:  alert oriented to time, place, and person.  Appear in mild distress, affect appropriate Eyes: PERRLA ENT: Oral Mucosa Clear, moist  Neck: no JVD,  Cardiovascular: S1 and S2 Present, no Murmur,  Respiratory: good respiratory effort, Bilateral Air entry equal and Decreased, mild bilateral crackles, no wheezes Abdomen: Bowel Sound present, Soft and no tenderness,  Skin: no  rashes Extremities: Mild edema of left lower extremity, no calf tenderness, s/p Right BKA stump washout, dressing CDI,  Neurologic: without any new focal findings Gait not checked due to patient safety concerns  Vitals:   11/25/22 1948 11/26/22 0427 11/26/22 0500 11/26/22 0744  BP: (!) 147/60 (!) 141/63  138/67  Pulse: 62 70  66  Resp: '20 20  18  '$ Temp: 98 F (36.7 C) 98.1 F (36.7 C)  98.4 F (36.9 C)  TempSrc:    Oral  SpO2: 98% 100%  99%  Weight:   106.3 kg   Height:        Intake/Output Summary (Last 24 hours) at 11/26/2022 1402 Last data filed at 11/26/2022 0800 Gross per 24 hour  Intake 577 ml  Output 3550 ml  Net -2973 ml    Filed Weights   11/23/22 0500 11/24/22 0500 11/26/22 0500  Weight: 101.8 kg 111.1 kg 106.3 kg    Data Reviewed: I have personally reviewed and interpreted daily labs, tele strips, imagings as discussed above. I reviewed all nursing notes, pharmacy notes, vitals, pertinent old records I have discussed plan of care as described above with RN and patient/family.  CBC: Recent Labs  Lab 11/22/22 0603 11/23/22 0535 11/24/22 0440 11/25/22 0508 11/26/22 0449  WBC 10.6* 10.3 14.1* 12.1* 9.5  HGB 7.7* 7.7* 9.1* 8.2* 7.5*  HCT 24.5* 24.4* 28.7* 25.8* 23.5*  MCV 92.5 92.8 91.1 91.2 91.4  PLT 308 306 349 302 474   Basic Metabolic Panel: Recent Labs  Lab 11/20/22 0400 11/21/22 0420 11/22/22 0603 11/23/22 0535 11/24/22 0440 11/25/22 0508 11/26/22 0449  NA 135 134* 138 134* 138 135 135  K 4.4 4.8 4.3 4.1 4.2 4.2 3.9  CL 101 99 104 102 104 103 104  CO2 '25 27 24 25 23 23 23  '$ GLUCOSE 142* 143* 114* 123* 140* 129* 110*  BUN 68* 78* 82* 80* 75* 84* 82*  CREATININE 1.22 1.42* 1.32* 1.24 1.25* 1.23 1.08  CALCIUM 8.7* 8.8* 9.1 8.8* 9.2 8.9 9.1  MG 2.0 2.1  --   --   --  2.4 2.2  PHOS 5.1* 5.2*  --   --   --  5.9* 5.9*    Studies: No results found.  Scheduled Meds:  amLODipine  10 mg Oral Daily   ARIPiprazole  10 mg Oral Daily   vitamin C   500 mg Oral BID   aspirin EC  81 mg Oral Q breakfast   atorvastatin  40 mg Oral QHS   bisacodyl  10 mg Oral QHS   clotrimazole   Topical BID   enoxaparin (LOVENOX) injection  0.5 mg/kg Subcutaneous Q24H   ferrous gluconate  324 mg Oral Daily   fluticasone furoate-vilanterol  1 puff Inhalation Daily   gabapentin  600 mg Oral BID   insulin aspart  0-15 Units Subcutaneous TID AC & HS   levothyroxine  75 mcg Oral Q0600   magnesium oxide  400 mg Oral  Daily   metoprolol tartrate  50 mg Oral BID   multivitamin with minerals  1 tablet Oral Daily   pantoprazole  40 mg Oral Daily   polyethylene glycol  17 g Oral Daily   polyvinyl alcohol  1 drop Both Eyes Daily   potassium chloride SA  20 mEq Oral Daily   Ensure Max Protein  11 oz Oral TID   sodium chloride flush  3 mL Intravenous Q12H   tamsulosin  0.8 mg Oral Daily   thiamine  100 mg Oral Daily   venlafaxine XR  150 mg Oral Q breakfast   Vitamin D (Ergocalciferol)  50,000 Units Oral Q7 days   zinc sulfate  220 mg Oral Daily   Continuous Infusions:  DAPTOmycin (CUBICIN) 700 mg in sodium chloride 0.9 % IVPB Stopped (11/25/22 2315)   furosemide (LASIX) 200 mg in dextrose 5 % 100 mL (2 mg/mL) infusion Stopped (11/25/22 1612)   PRN Meds: acetaminophen, albuterol, fentaNYL (SUBLIMAZE) injection, hydrALAZINE, HYDROmorphone (DILAUDID) injection, ipratropium-albuterol, loperamide HCl, ondansetron (ZOFRAN) IV, mouth rinse, oxyCODONE  Time spent: 50 minutes  Author: Val Riles. MD [facility] 11/26/2022 2:02 PM  To reach On-call, see care teams to locate the attending and reach out to them via [email or web address]. If 7PM-7AM, please contact night-coverage If you still have difficulty reaching the attending provider, please page the [facility] (Director on Call) for [facility] on amion for assistance.

## 2022-11-26 NOTE — Progress Notes (Signed)
Physical Therapy Treatment Patient Details Name: Shane Chambers MRN: [id number] DOB: 03-Jun-1961 Today's Date: 11/26/2022   History of Present Illness 62 year old male admitted to the medical unit (transfer from geri psych) found to have complication of his amputation site (R BKA 09/2022), followed by vascular. Pt is now s/p excisional debridement & drainage of abscess & application of wound vac on RLE residual limb on 11/14/22,  revision and closure of RLE residual limb 11/18/22. PMH significant for CAD s/p CABG, HFrEF with recovered EF, PVD, type 2 diabetes, COPD, hypertension, hyperlipidemia, hypothyroidism, depression    PT Comments    Pt was long sitting in bed upon arriving. He is alert but more so lethargic than previous observed last week. Pt remains motivated and pleasant. Was able to transfer to/from bed>< recliner via lateral scoot but unwilling to attempt standing. Pt was unaware of his BM but was able to move back into bed for hygiene care prior to getting back up to recliner. RN in room. Recommend SNF at DC to maximize independence while decreasing caregiver burden.    Recommendations for follow up therapy are one component of a multi-disciplinary discharge planning process, led by the attending physician.  Recommendations may be updated based on patient status, additional functional criteria and insurance authorization.  Follow Up Recommendations  Skilled nursing-short term rehab (<3 hours/day)     Assistance Recommended at Discharge Frequent or constant Supervision/Assistance  Patient can return home with the following Two people to help with walking and/or transfers;A lot of help with bathing/dressing/bathroom;Assistance with cooking/housework;Direct supervision/assist for medications management;Direct supervision/assist for financial management;Assist for transportation;Help with stairs or ramp for entrance   Equipment Recommendations  Other (comment) (defer to next level of care)        Precautions / Restrictions Precautions Precautions: Fall Restrictions Weight Bearing Restrictions: Yes     Mobility  Bed Mobility Overal bed mobility: Modified Independent Bed Mobility: Rolling, Sidelying to Sit, Sit to Supine Rolling: Modified independent (Device/Increase time) Sidelying to sit: Modified independent (Device/Increase time) Supine to sit: Modified independent (Device/Increase time)     Transfers Overall transfer level: Needs assistance Equipment used: None Transfers: Bed to chair/wheelchair/BSC   Lateral/Scoot Transfers: Min assist General transfer comment: pt performed lateral transfers to/from chair 2 x. unable to stand so had to return to bed for hygiene care after BM. Overall pt tolerated session well    Ambulation/Gait    General Gait Details: currently unable   Balance Overall balance assessment: Needs assistance Sitting-balance support: No upper extremity supported Sitting balance-Leahy Scale: Good Sitting balance - Comments: no LOB while seated EOB, no UE support   Standing balance comment: Pt unwilling to attempt standing today. recomemnd +2 assistance for any standing attempts however sara lift would be only safe way to stand currently       Cognition Arousal/Alertness: Awake/alert (Somewhat lethargic in presentation compared to last week) Behavior During Therapy: [facility] for tasks assessed/performed Overall Cognitive Status: Within Functional Limits for tasks assessed    Following Commands: Follows one step commands with increased time       General Comments: Pt is A but overall more lethargic than previously observed last week. he remins cooperative and motivated throughout. Unaware he had a BM. sacral pad placed after hygiene care performed           General Comments General comments (skin integrity, edema, etc.): Reviewed amputee exercises and had pt perform. Reviewed importance of knee extension and stretching.      Pertinent  Vitals/Pain Pain Assessment Pain Assessment: 0-10 Pain Score: 4  Pain Location: [name] Pain Descriptors / Indicators: Aching Pain Intervention(s): Limited activity within patient's tolerance, Monitored during session, Premedicated before session, Repositioned     PT Goals (current goals can now be found in the care plan section) Acute Rehab PT Goals Patient Stated Goal: get better Progress towards PT goals: Progressing toward goals    Frequency    7X/week      PT Plan Current plan remains appropriate       AM-PAC PT "6 Clicks" Mobility   Outcome Measure  Help needed turning from your back to your side while in a flat bed without using bedrails?: None Help needed moving from lying on your back to sitting on the side of a flat bed without using bedrails?: A Little Help needed moving to and from a bed to a chair (including a wheelchair)?: A Little Help needed standing up from a chair using your arms (e.g., wheelchair or bedside chair)?: Total Help needed to walk in hospital room?: Total Help needed climbing 3-5 steps with a railing? : Total 6 Click Score: 13    End of Session   Activity Tolerance: Patient tolerated treatment well;Patient limited by lethargy Patient left: in chair;with call bell/phone within reach;with chair alarm set Nurse Communication: Mobility status PT Visit Diagnosis: Muscle weakness (generalized) (M62.81);Difficulty in walking, not elsewhere classified (R26.2);Other abnormalities of gait and mobility (R26.89)     Time: 1188-6773 PT Time Calculation (min) (ACUTE ONLY): 20 min  Charges:  $Therapeutic Activity: 8-22 mins                     Julaine Fusi PTA 11/26/22, 12:00 PM

## 2022-11-26 NOTE — TOC Progression Note (Signed)
Transition of Care [facility]) - Progression Note    Patient Details  Name: SERJIO DEUPREE MRN: [id number] Date of Birth: Nov 18, 1960  Transition of Care [facility]) CM/SW [place], LCSW Phone Number: 11/26/2022, 3:53 PM  Clinical Narrative:   Patient is now agreeable to returning to [place]. Admissions is aware.  Expected Discharge Plan and Services                                               Social Determinants of Health (SDOH) Interventions SDOH Screenings   Food Insecurity: No Food Insecurity (11/13/2022)  Housing: Low Risk  (11/13/2022)  Transportation Needs: No Transportation Needs (11/13/2022)  Utilities: Not At Risk (11/13/2022)  Alcohol Screen: Low Risk  (11/05/2022)  Tobacco Use: Medium Risk (11/20/2022)    Readmission Risk Interventions    11/11/2022    2:05 PM 10/03/2022   10:08 AM 09/26/2022    1:16 PM  Readmission Risk Prevention Plan  Transportation Screening Complete Complete Complete  PCP or Specialist Appt within 3-5 Days   Complete  HRI or [place]   Complete  Social Work Consult for [place] Planning/Counseling   Complete  Palliative Care Screening   Not Applicable  Medication Review [employer])   Complete  HRI or Home Care Consult Complete Complete   SW Recovery Care/Counseling Consult Complete Complete   Palliative Care Screening Not Applicable Not Applicable   Skilled Nursing Facility Complete Complete

## 2022-11-27 ENCOUNTER — Inpatient Hospital Stay: Payer: Medicare Other

## 2022-11-27 LAB — BASIC METABOLIC PANEL
Anion gap: 10 (ref 5–15)
BUN: 86 mg/dL — ABNORMAL HIGH (ref 6–20)
CO2: 23 mmol/L (ref 22–32)
Calcium: 8.7 mg/dL — ABNORMAL LOW (ref 8.9–10.3)
Chloride: 102 mmol/L (ref 98–111)
Creatinine, Ser: 1.29 mg/dL — ABNORMAL HIGH (ref 0.61–1.24)
GFR, Estimated: >60 mL/min (ref >60)
Glucose, Bld: 110 mg/dL — ABNORMAL HIGH (ref 70–99)
Potassium: 4.4 mmol/L (ref 3.5–5.1)
Sodium: 135 mmol/L (ref 135–145)

## 2022-11-27 LAB — MAGNESIUM: Magnesium: 2.4 mg/dL (ref 1.7–2.4)

## 2022-11-27 LAB — PHOSPHORUS: Phosphorus: 6.3 mg/dL — ABNORMAL HIGH (ref 2.5–4.6)

## 2022-11-27 LAB — CBC
HCT: 23.3 % — ABNORMAL LOW (ref 39.0–52.0)
Hemoglobin: 7.5 g/dL — ABNORMAL LOW (ref 13.0–17.0)
MCH: 29.8 pg (ref 26.0–34.0)
MCHC: 32.2 g/dL (ref 30.0–36.0)
MCV: 92.5 fL (ref 80.0–100.0)
Platelets: 291 10*3/uL (ref 150–400)
RBC: 2.52 MIL/uL — ABNORMAL LOW (ref 4.22–5.81)
RDW: 15.5 % (ref 11.5–15.5)
WBC: 8.6 10*3/uL (ref 4.0–10.5)
nRBC: 0 % (ref 0.0–0.2)

## 2022-11-27 LAB — GLUCOSE, CAPILLARY
Glucose-Capillary: 106 mg/dL — ABNORMAL HIGH (ref 70–99)
Glucose-Capillary: 159 mg/dL — ABNORMAL HIGH (ref 70–99)
Glucose-Capillary: 151 mg/dL — ABNORMAL HIGH (ref 70–99)
Glucose-Capillary: 165 mg/dL — ABNORMAL HIGH (ref 70–99)

## 2022-11-27 NOTE — Consult Note (Addendum)
WOC consult requested for BKA stump. Vascular team is following for assessment and plan of care.  They previously took the patient to the OR for a washout and provided bedside nurses with topical treatment orders. According to their notes on 1/9: "Dressing changed today.  Wet-to-dry's with gauze packing the wound covered with ABDs and wrapped in Kerlix.  No purulent drainage noted.  2 nylon retention sutures remain in place.  Vascular surgery will continue to follow and nursing to do daily dressing changes."  Please refer to vascular team for further questions regarding plan of care.  Please re-consult if further assistance is needed.  Thank-you,  Julien Girt MSN, [place], [place], [place], [place]

## 2022-11-27 NOTE — Progress Notes (Signed)
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 Pt is now s/p excisional debridement & drainage of abscess & application of wound vac on RLE residual limb on 11/14/22,  revision and closure of RLE residual limb 11/18/22. PMH significant for CAD s/p CABG, HFrEF with recovered EF, PVD, type 2 diabetes, COPD, hypertension, hyperlipidemia, hypothyroidism, depression    PT Comments    Pt was long sitting in bed upon arrival. He is alert and oriented and agreeable to session. Bed linens were soaked from purewick malfunctioning. Assisted pt OOB to recliner via lateral scoot. Pt still unwilling/unable to stand with +1 assistance. Will attempt standing tomorrow when +2 assist available. He tolerated there ex and stretching well. Continue to recommend DC to SNF to maximize his independence and safety with all ADLs.    Recommendations for follow up therapy are one component of a multi-disciplinary discharge planning process, led by the attending physician.  Recommendations may be updated based on patient status, additional functional criteria and insurance authorization.  Follow Up Recommendations  Skilled nursing-short term rehab (<3 hours/day)     Assistance Recommended at Discharge Frequent or constant Supervision/Assistance  Patient can return home with the following Two people to help with walking and/or transfers;A lot of help with bathing/dressing/bathroom;Assistance with cooking/housework;Direct supervision/assist for medications management;Direct supervision/assist for financial management;Assist for transportation;Help with stairs or ramp for entrance   Equipment Recommendations  Other (comment) (defer to next level of care)        Precautions / Restrictions Precautions Precautions: Fall Restrictions Weight Bearing Restrictions: Yes [name] Weight Bearing: Weight bearing as tolerated LUE Weight Bearing: Weight bearing as tolerated RLE Weight Bearing: Non weight bearing LLE Weight Bearing: Weight bearing as tolerated     Mobility  Bed Mobility Overal bed mobility: Modified Independent Bed Mobility: Rolling, Sidelying to Sit, Sit to Supine Rolling: Modified independent (Device/Increase time) Sidelying to sit: Modified independent (Device/Increase time) Supine to sit: Modified independent (Device/Increase time)  General bed mobility comments: no physical assistance however increased time required to perform all desired task    Transfers Overall transfer level: Needs assistance Equipment used: None Transfers: Bed to chair/wheelchair/BSC     Lateral/Scoot Transfers: Min assist General transfer comment: CGA to safely transfer form EOB to recliner via lateral scoot transfer. pt still endorses not feeling strong enough to stand. Will attempt STS tomorrow with +2 assistance. RN informed of concerns of LLE edema/redness/ purwick malfunctioning .    Ambulation/Gait  General Gait Details: currently unable   Balance Overall balance assessment: Needs assistance Sitting-balance support: No upper extremity supported Sitting balance-Leahy Scale: Good Sitting balance - Comments: no LOB while seated EOB, no UE support  Standing balance comment: not formally tested      Cognition Arousal/Alertness: Awake/alert Behavior During Therapy: WFL for tasks assessed/performed Overall Cognitive Status: Within Functional Limits for tasks assessed      Following Commands: Follows one step commands with increased time       General Comments: Pt was A and O x 3. Agrees to session and is cooperative throughout.           General Comments General comments (skin integrity, edema, etc.): Focused on RLE strengthening and ROM  exercises at EOB and in recliner.      Pertinent  Vitals/Pain Pain Assessment Pain Assessment: 0-10 Pain Score: 4  Pain Location: [name] Pain Descriptors / Indicators: Aching Pain Intervention(s): Limited activity within patient's tolerance, Monitored during session, Premedicated before session, Repositioned     PT Goals (current goals can now be found in the care plan section) Acute Rehab PT Goals Patient Stated Goal: get better Progress towards PT goals: Progressing toward goals    Frequency    7X/week      PT Plan Current plan remains appropriate       AM-PAC PT "6 Clicks" Mobility   Outcome Measure  Help needed turning from your back to your side while in a flat bed without using bedrails?: None Help needed moving from lying on your back to sitting on the side of a flat bed without using bedrails?: A Little Help needed moving to and from a bed to a chair (including a wheelchair)?: A Little Help needed standing up from a chair using your arms (e.g., wheelchair or bedside chair)?: Total Help needed to walk in hospital room?: Total Help needed climbing 3-5 steps with a railing? : Total 6 Click Score: 13    End of Session   Activity Tolerance: Patient tolerated treatment well;Patient limited by lethargy Patient left: in chair;with call bell/phone within reach;with chair alarm set Nurse Communication: Mobility status PT Visit Diagnosis: Muscle weakness (generalized) (M62.81);Difficulty in walking, not elsewhere classified (R26.2);Other abnormalities of gait and mobility (R26.89)     Time: 6433-2951 PT Time Calculation (min) (ACUTE ONLY): 24 min  Charges:  $Therapeutic Exercise: 8-22 mins $Therapeutic Activity: 8-22 mins                     Julaine Fusi PTA 11/27/22, 12:32 PM

## 2022-11-27 NOTE — Progress Notes (Signed)
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 Pt is now s/p excisional debridement & drainage of abscess & application of wound vac on RLE residual limb on 11/14/22,  revision and closure of RLE residual limb 11/18/22. PMH significant for CAD s/p CABG, HFrEF with recovered EF, PVD, type 2 diabetes, COPD, hypertension, hyperlipidemia, hypothyroidism, depression   OT comments  Shane Chambers was more lethargic than usual today, declining OOB activity. He was able to perform bed mobility with Mod I, perform grooming w/ assistance at EOB following set up. He states that he is having no pain today -- the first time this pt has denied pain during an OT session with this therapist. He reports feeling more accepting of return to [place]. He is also no longer experiencing SOB and is no longer on O2. Encouraged pt to make effort to get out of bed each day, he verbalizes understanding.    Recommendations for follow up therapy are one component of a multi-disciplinary discharge planning process, led by the attending physician.  Recommendations may be updated based on patient status, additional functional criteria and insurance authorization.    Follow Up Recommendations  Skilled nursing-short term rehab (<3 hours/day)     Assistance Recommended at Discharge Frequent or constant Supervision/Assistance  Patient can return home with the following  A lot of help with walking and/or transfers;A lot of help with bathing/dressing/bathroom   Equipment Recommendations       Recommendations for Other Services      Precautions / Restrictions Precautions Precautions: Fall Restrictions Weight Bearing Restrictions: No       Mobility Bed  Mobility Overal bed mobility: Modified Independent Bed Mobility: Rolling, Supine to Sit, Sit to Supine Rolling: Modified independent (Device/Increase time)   Supine to sit: Modified independent (Device/Increase time) Sit to supine: Modified independent (Device/Increase time)        Transfers                         Balance Overall balance assessment: Needs assistance Sitting-balance support: No upper extremity supported Sitting balance-Leahy Scale: Good Sitting balance - Comments: no LOB while seated EOB, no UE support                                   ADL either performed or assessed with clinical judgement   ADL       Grooming: Sitting;Set up;Wash/dry hands;Wash/dry face;Brushing hair                                      Extremity/Trunk Assessment Upper Extremity Assessment Upper Extremity Assessment: Generalized weakness   Lower Extremity Assessment Lower Extremity Assessment: Generalized weakness        Vision       Perception     Praxis      Cognition Arousal/Alertness: Awake/alert Behavior During Therapy: WFL for tasks assessed/performed Overall Cognitive Status: Within Functional Limits for tasks assessed  Exercises Other Exercises Other Exercises: Educ re: DC recs, importance of regular movement, UE therex    Shoulder Instructions       General Comments      Pertinent Vitals/ Pain       Pain Assessment Pain Score: 0-No pain  Home Living                                          Prior Functioning/Environment              Frequency  Min 2X/week        Progress Toward Goals  OT Goals(current goals can now be found in the care plan section)  Progress towards OT goals: Progressing toward goals  Acute Rehab OT Goals OT Goal Formulation: With patient Time For Goal Achievement: 12/09/22 Potential to Achieve Goals:  [place] Discharge plan remains appropriate;Frequency remains appropriate    Co-evaluation                 AM-PAC OT "6 Clicks" Daily Activity     Outcome Measure   Help from another person eating meals?: None Help from another person taking care of personal grooming?: A Little Help from another person toileting, which includes using toliet, bedpan, or urinal?: A Lot Help from another person bathing (including washing, rinsing, drying)?: A Lot Help from another person to put on and taking off regular upper body clothing?: A Little Help from another person to put on and taking off regular lower body clothing?: A Little 6 Click Score: 17    End of Session    OT Visit Diagnosis: Unsteadiness on feet (R26.81);Muscle weakness (generalized) (M62.81)   Activity Tolerance Patient tolerated treatment well   Patient Left in bed;with call bell/phone within reach   Nurse Communication          Time: 5573-2202 OT Time Calculation (min): 8 min  Charges: OT General Charges $OT Visit: 1 Visit OT Treatments $Self Care/Home Management : 8-22 mins  Josiah Lobo, PhD, MS, OTR/L 11/27/22, 4:55 PM

## 2022-11-27 NOTE — Progress Notes (Signed)
[facility] Progress Note  Patient: Shane Chambers    LKT:[id number]  DOA: 11/11/2022     Date of Service: the patient was seen and examined on 11/27/2022  No chief complaint on file.  Brief hospital course:  Shane Chambers is a 62 y.o. male with past medical history of CAD s/p CABG, HFrEF with recovered EF, PVD, type 2 diabetes, COPD, hypertension, hyperlipidemia, hypothyroidism, depression who is current he admitted for suicidal ideation and found to have complication of his amputation site.  Shane Chambers states that for the last 3 days, he has been experiencing increasing pain of his right BKA stump in addition to increasing erythema.  His nurse at bedside states that he has been picking at it. Patient has not noticed any purulent drainage at this time but there are areas where he is worried that the incision is opening. Per chart review, patient was admitted on 11/23 for cellulitis of the right lower extremity extending from the foot up towards the knee.  CT imaging at that time demonstrated holdable complex fluid collections and possible medial malleolus osteomyelitis.  MRI was subsequently obtained that demonstrated right ankle septic arthritis.  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  He followed up with orthopedic surgery on 12/14.  At that time, he was instructed to follow-up in 1 week for staple removal.   Assessment and Plan:  Right BKA stump abscess/MRSA wound infection, s/p revision and washout done x 2 on 11/14/2022 and 11/18/22 by Vasc Sx H/o Acute osteomyelitis, s/p right BKA Initially patient was seen in consultation at [facility], however required transfer to the medical unit due to inability to administer IV antibiotics.  Patient presented with 3 days of gradually worsening right stump erythema and pain.  Initial concern for cellulitis or only, however vascular surgery was able to put a probe down to the bone with 200 cc of purulent drainage, more consistent with  osteomyelitis. Inflammatory markers are elevated. Vascular surgery consulted, s/p revision of right BKA stump, washout of Abscess in the posterior portion of the below-knee amputation stump, Deep wound culture to microbiology  S/p ceftriaxone and metronidazole, which were discontinued by ID 1/8 wound culture growing MRSA, discontinued ceftriaxone.  Wound culture for 5 days Tylenol as needed for fever Started gabapentin, patient was taking 6000 mg p.o. twice daily changed oxycodone 5 mg p.o. every 4 hourly as needed WBC count trending down Wound care consulted 1/8 ID consulted for recommendation of antibiotics and duration of treatment  1/9 s/p Excisional debridement right below-knee amputation stump  1/10 seen by vascular surgery, recommended Dressing changed today.  Wet-to-dry's with gauze packing the wound covered with ABDs and wrapped in Kerlix.  1/12 DC'd IV vancomycin due to elevated functions and started on daptomycin by ID Follow vascular surgery and ID for discharge planning    # Acute hyper respiratory failure developed on 1/16 # Pulmonary edema, complaining of shortness of breath on 1/16 CXR consistent with pulmonary edema and pleural effusion S/p Lasix IV 20 mg x 1 dose followed by Lasix IV infusion,d/c'd on 1/18 Monitor urine output and renal functions Continue supplemental O2 inhalation and gradually wean off 1/18 discontinued IV Lasix, plans to resume torsemide tomorrow a.m. if renal functions will improve Respiratory failure resolved, patient is saturating well on room air   # Constipation, Resolved 1/12, x-ray abdomen shows moderate stool burden throughout the colon Patient was requesting enema, as per RN patient had multiple bowel movements but x-ray still shows moderate stool burden.  So we will go ahead and give soapsuds enema.  Decrease MiraLAX daily with holding parameters and Dulcolax nightly with holding parameters.   Elevated creatinine could be due to  dehydration Continue to monitor renal.,  Patient is on vancomycin, pharmacy will adjust dose according to renal functions Started gentle hydration for few hours Hold off torsemide home dose 40 mg p.o. daily,  1/16 started Lasix IV gtt, will monitor renal functions daily. 1/18 discontinued IV Lasix, plans to resume torsemide tomorrow a.m. if renal functions will improve   Hypomagnesemia, mag repleted.  Resolved Monitor and replete as needed.  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  EKG today with improved QTc.    Hypertension, continue amlodipine, Lopressor  HLD, decrease Lipitor from 80-40 due to risk of rhabdomyolysis as patient is on daptomycin.  Dose can be increased back to 80 mg if needed after completion of antibiotics.  Depression - Continue SSRIs and antipsychotics started on recent [facility] admission  Continue Abilify and Effexor  Type 2 diabetes mellitus (HCC) - SSI, moderate - Hold home metformin  Hypothyroid, continued Synthroid BPH, continue Flomax Vitamin D insufficiency: started vitamin D 50,000 units p.o. weekly, follow with PCP to repeat vitamin D level after 3 to 6 months.  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  Recommended to follow with neurology for EMG and NCS test as an outpatient  BMI 36     Consultants:  Vascular surgery ID Psych   Procedures:  1/5 s/p revision of right BKA stump, washout of Abscess in the posterior portion of the below-knee amputation stump, Deep wound culture to microbiology  1/9 Excisional debridement right below-knee amputation stump   Antimicrobials:  1/12 started Daptomycin S/p  Vancomycin till 1/12  Pressure Injury 11/06/22 Buttocks Right Stage 2 -  Partial thickness loss of dermis presenting as a shallow open injury with a red, pink wound bed without slough. no odor. no drainage. no slough. 1cm X 0.3cm (Active)  11/06/22 1800  Location: Buttocks  Location Orientation: Right  Staging: Stage 2 -  Partial thickness loss of dermis presenting as a shallow open injury with a red, pink wound bed without slough.  Wound Description (Comments): no odor. no drainage. no slough. 1cm X 0.3cm  Present on Admission:   Dressing Type Foam - Lift dressing to assess site every shift 11/26/22 2000     Diet: Heart healthy/carb modified DVT Prophylaxis: Subcutaneous Lovenox   Advance goals of care discussion: Full code  Family Communication: family was NOT present at bedside, at the time of interview.  The pt provided permission to discuss medical plan with the family. Opportunity was given to ask question and all questions were answered satisfactorily.   Disposition:  Pt is from SNF, admitted with right BKA infection and depression with SI/HI ideations under BHU, still has infection, on IV antibiotics, s/p right BKA stump washout done on 1/5, second washout on 1/9 , wound culture growing MRSA, ID consulted for further recommendation, which precludes a safe discharge. Discharge to SNF, when cleared by vascular surgery and ID 1/17 discussed with patient and counseled him, he agreed to go to the same facility  for SNF, informed to [facility], we can plan to discharge him in 1 to 2 days. 1/18 discharge most likely tomorrow a.m. if cleared by ID  Subjective: No significant events overnight, patient feels improvement in the shortness of breath, saturating well on room air.  Patient was complaining of swelling in the left lower extremity, we will get venous duplex done to rule out DVT, less likely though. Patient denies any chest pain or palpitation, no any other active issues.   Physical  Exam: General:  alert oriented to time, place, and person.  Appear in mild distress, affect appropriate Eyes: PERRLA ENT: Oral Mucosa Clear, moist  Neck: no JVD,  Cardiovascular: S1 and S2 Present, no Murmur,  Respiratory: good respiratory effort, Bilateral Air entry equal and Decreased, mild bilateral crackles, no wheezes Abdomen: Bowel Sound present, Soft and no tenderness,  Skin: no rashes Extremities: Mild edema of left lower extremity, no calf tenderness, s/p Right BKA stump washout, dressing CDI,  Neurologic: without any new focal findings Gait not checked due to patient safety concerns  Vitals:   11/26/22 1500 11/26/22 1947 11/27/22 0400 11/27/22 0757  BP: 125/61 (!) 109/51 135/64 (!) 123/51  Pulse: 62 63 65 61  Resp: '16 18 16 16  '$ Temp: 98.2 F (36.8 C) (!) 97.5 F (36.4 C) 97.8 F (36.6 C) 97.9 F (36.6 C)  TempSrc: Oral Oral Oral Oral  SpO2: 100% 100% 94% 99%  Weight:      Height:        Intake/Output Summary (Last 24 hours) at 11/27/2022 1648 Last data filed at 11/27/2022 0759 Gross per 24 hour  Intake --  Output 1800 ml  Net -1800 ml    Filed Weights   11/23/22 0500 11/24/22 0500 11/26/22 0500  Weight: 101.8 kg 111.1 kg 106.3 kg    Data Reviewed: I have personally reviewed and interpreted daily labs, tele strips, imagings as discussed above. I reviewed all nursing notes, pharmacy notes, vitals, pertinent old records I have discussed plan of care as described above with RN and patient/family.  CBC: Recent Labs  Lab 11/23/22 0535 11/24/22 0440 11/25/22 0508 11/26/22 0449 11/27/22 0506  WBC 10.3 14.1* 12.1* 9.5 8.6  HGB 7.7* 9.1* 8.2* 7.5* 7.5*  HCT 24.4* 28.7* 25.8* 23.5* 23.3*  MCV 92.8 91.1 91.2 91.4 92.5  PLT 306 349 302 289 962   Basic Metabolic Panel: Recent Labs  Lab 11/21/22 0420 11/22/22 0603 11/23/22 0535 11/24/22 0440 11/25/22 0508 11/26/22 0449 11/27/22 0506  NA 134*   < > 134* 138 135 135 135  K 4.8   < > 4.1 4.2 4.2 3.9 4.4   CL 99   < > 102 104 103 104 102  CO2 27   < > '25 23 23 23 23  '$ GLUCOSE 143*   < > 123* 140* 129* 110* 110*  BUN 78*   < > 80* 75* 84* 82* 86*  CREATININE 1.42*   < > 1.24 1.25* 1.23 1.08 1.29*  CALCIUM 8.8*   < > 8.8* 9.2 8.9 9.1 8.7*  MG 2.1  --   --   --  2.4 2.2 2.4  PHOS 5.2*  --   --   --  5.9* 5.9* 6.3*   < > = values in this interval not displayed.    Studies: US Venous Img Lower Unilateral Left (DVT)  Result Date: 11/27/2022 CLINICAL DATA:  Left ankle edema. EXAM: LEFT LOWER EXTREMITY VENOUS DOPPLER ULTRASOUND TECHNIQUE: Gray-scale sonography with graded compression, as well  as color Doppler and duplex ultrasound were performed to evaluate the lower extremity deep venous systems from the level of the common femoral vein and including the common femoral, femoral, profunda femoral, popliteal and calf veins including the posterior tibial, peroneal and gastrocnemius veins when visible. The superficial great saphenous vein was also interrogated. Spectral Doppler was utilized to evaluate flow at rest and with distal augmentation maneuvers in the common femoral, femoral and popliteal veins. COMPARISON:  None Available. FINDINGS: Contralateral Common Femoral Vein: Respiratory phasicity is normal and symmetric with the symptomatic side. No evidence of thrombus. Normal compressibility. Common Femoral Vein: No evidence of thrombus. Normal compressibility, respiratory phasicity and response to augmentation. Saphenofemoral Junction: No evidence of thrombus. Normal compressibility and flow on color Doppler imaging. Profunda Femoral Vein: No evidence of thrombus. Normal compressibility and flow on color Doppler imaging. Femoral Vein: No evidence of thrombus. Normal compressibility, respiratory phasicity and response to augmentation. Popliteal Vein: No evidence of thrombus. Normal compressibility, respiratory phasicity and response to augmentation. Calf Veins: No evidence of thrombus. Normal compressibility  and flow on color Doppler imaging. Superficial Great Saphenous Vein: No evidence of thrombus. Normal compressibility. Venous Reflux:  None. Other Findings: No evidence of superficial thrombophlebitis or abnormal fluid collection. IMPRESSION: No evidence of left lower extremity deep venous thrombosis. Electronically Signed   By: Aletta Edouard M.D.   On: 11/27/2022 15:47    Scheduled Meds:  amLODipine  10 mg Oral Daily   ARIPiprazole  10 mg Oral Daily   vitamin C  500 mg Oral BID   aspirin EC  81 mg Oral Q breakfast   atorvastatin  40 mg Oral QHS   bisacodyl  10 mg Oral QHS   clotrimazole   Topical BID   enoxaparin (LOVENOX) injection  0.5 mg/kg Subcutaneous Q24H   ferrous gluconate  324 mg Oral Daily   fluticasone furoate-vilanterol  1 puff Inhalation Daily   gabapentin  600 mg Oral BID   insulin aspart  0-15 Units Subcutaneous TID AC & HS   levothyroxine  75 mcg Oral Q0600   magnesium oxide  400 mg Oral Daily   metoprolol tartrate  50 mg Oral BID   multivitamin with minerals  1 tablet Oral Daily   pantoprazole  40 mg Oral Daily   polyethylene glycol  17 g Oral Daily   polyvinyl alcohol  1 drop Both Eyes Daily   potassium chloride SA  20 mEq Oral Daily   Ensure Max Protein  11 oz Oral TID   sodium chloride flush  3 mL Intravenous Q12H   tamsulosin  0.8 mg Oral Daily   thiamine  100 mg Oral Daily   venlafaxine XR  150 mg Oral Q breakfast   Vitamin D (Ergocalciferol)  50,000 Units Oral Q7 days   Continuous Infusions:  DAPTOmycin (CUBICIN) 700 mg in sodium chloride 0.9 % IVPB 700 mg (11/26/22 2145)   PRN Meds: acetaminophen, albuterol, fentaNYL (SUBLIMAZE) injection, hydrALAZINE, HYDROmorphone (DILAUDID) injection, ipratropium-albuterol, loperamide HCl, ondansetron (ZOFRAN) IV, mouth rinse, oxyCODONE  Time spent: 50 minutes  Author: Val Riles. MD [facility] 11/27/2022 4:48 PM  To reach On-call, see care teams to locate the attending and reach out to them via  [email or web address]. If 7PM-7AM, please contact night-coverage If you still have difficulty reaching the attending provider, please page the [facility] (Director on Call) for [facility] on amion for assistance.

## 2022-11-27 NOTE — Plan of Care (Signed)

## 2022-11-27 NOTE — Anesthesia Postprocedure Evaluation (Signed)
Anesthesia Post Note  Patient: Shane Chambers  Procedure(s) Performed: AMPUTATION BELOW KNEE REVISION; WASHOUT (Right: Knee)  Patient location during evaluation: PACU Anesthesia Type: General Level of consciousness: awake and alert Pain management: pain level controlled Vital Signs Assessment: post-procedure vital signs reviewed and stable Respiratory status: spontaneous breathing and nonlabored ventilation Cardiovascular status: stable Anesthetic complications: no  No notable events documented.   Last Vitals:  Vitals:   11/26/22 1947 11/27/22 0400  BP: (!) 109/51 135/64  Pulse: 63 65  Resp: 18 16  Temp: (!) 36.4 C 36.6 C  SpO2: 100% 94%    Last Pain:  Vitals:   11/27/22 0400  TempSrc: Oral  PainSc:                  VAN STAVEREN,Arelie Kuzel

## 2022-11-27 NOTE — TOC Progression Note (Signed)
Transition of Care [facility]) - Progression Note    Patient Details  Name: Shane Chambers MRN: [id number] Date of Birth: 1960-11-23  Transition of Care [facility]: Lambert Rhonda W) CM/SW [place], LCSW Phone Number: 11/27/2022, 12:15 PM  Clinical Narrative: Notified patient that he would be returning to the long-term care side at [facility], just on a different end of the hall.    Expected Discharge Plan and Services                                               Social Determinants of Health (SDOH) Interventions SDOH Screenings   Food Insecurity: No Food Insecurity (11/13/2022)  Housing: Low Risk  (11/13/2022)  Transportation Needs: No Transportation Needs (11/13/2022)  Utilities: Not At Risk (11/13/2022)  Alcohol Screen: Low Risk  (11/05/2022)  Tobacco Use: Medium Risk (11/20/2022)    Readmission Risk Interventions    11/11/2022    2:05 PM 10/03/2022   10:08 AM 09/26/2022    1:16 PM  Readmission Risk Prevention Plan  Transportation Screening Complete Complete Complete  PCP or Specialist Appt within 3-5 Days   Complete  HRI or [place]   Complete  Social Work Consult for [place] Planning/Counseling   Complete  Palliative Care Screening   Not Applicable  Medication Review Press photographer)   Complete  HRI or Home Care Consult Complete Complete   SW Recovery Care/Counseling Consult Complete Complete   Palliative Care Screening Not Applicable Not Applicable   Skilled Nursing Facility Complete Complete

## 2022-11-28 ENCOUNTER — Inpatient Hospital Stay: Payer: Self-pay

## 2022-11-28 LAB — BASIC METABOLIC PANEL
Anion gap: 8 (ref 5–15)
BUN: 85 mg/dL — ABNORMAL HIGH (ref 6–20)
CO2: 23 mmol/L (ref 22–32)
Calcium: 8.8 mg/dL — ABNORMAL LOW (ref 8.9–10.3)
Chloride: 103 mmol/L (ref 98–111)
Creatinine, Ser: 1.31 mg/dL — ABNORMAL HIGH (ref 0.61–1.24)
GFR, Estimated: >60 mL/min (ref >60)
Glucose, Bld: 124 mg/dL — ABNORMAL HIGH (ref 70–99)
Potassium: 4.3 mmol/L (ref 3.5–5.1)
Sodium: 134 mmol/L — ABNORMAL LOW (ref 135–145)

## 2022-11-28 LAB — CBC
HCT: 25.1 % — ABNORMAL LOW (ref 39.0–52.0)
Hemoglobin: 7.9 g/dL — ABNORMAL LOW (ref 13.0–17.0)
MCH: 29.2 pg (ref 26.0–34.0)
MCHC: 31.5 g/dL (ref 30.0–36.0)
MCV: 92.6 fL (ref 80.0–100.0)
Platelets: 310 10*3/uL (ref 150–400)
RBC: 2.71 MIL/uL — ABNORMAL LOW (ref 4.22–5.81)
RDW: 15.5 % (ref 11.5–15.5)
WBC: 7.9 10*3/uL (ref 4.0–10.5)
nRBC: 0 % (ref 0.0–0.2)

## 2022-11-28 LAB — GLUCOSE, CAPILLARY
Glucose-Capillary: 109 mg/dL — ABNORMAL HIGH (ref 70–99)
Glucose-Capillary: 119 mg/dL — ABNORMAL HIGH (ref 70–99)

## 2022-11-28 LAB — CK: Total CK: 49 U/L (ref 49–397)

## 2022-11-28 MED ORDER — DAPTOMYCIN IV (FOR PTA / DISCHARGE USE ONLY): 700.0000 mg | INTRAVENOUS | 0 refills | Status: AC

## 2022-11-28 MED ORDER — ASCORBIC ACID 500 MG PO TABS: 500.0000 mg | ORAL_TABLET | Freq: Two times a day (BID) | ORAL | 0 refills | Status: AC

## 2022-11-28 MED ORDER — METOPROLOL TARTRATE 50 MG PO TABS: 50.0000 mg | ORAL_TABLET | Freq: Two times a day (BID) | ORAL | Status: DC

## 2022-11-28 MED ORDER — ARIPIPRAZOLE 10 MG PO TABS: 10.0000 mg | ORAL_TABLET | Freq: Every day | ORAL | Status: DC

## 2022-11-28 MED ORDER — CHLORHEXIDINE GLUCONATE CLOTH 2 % EX PADS
6.0000 | MEDICATED_PAD | Freq: Every day | CUTANEOUS | Status: DC
  Administered 2022-11-28: 6 via TOPICAL

## 2022-11-28 MED ORDER — OXYCODONE HCL 5 MG PO TABS: 5.0000 mg | ORAL_TABLET | Freq: Four times a day (QID) | ORAL | 0 refills | Status: DC | PRN

## 2022-11-28 MED ORDER — GABAPENTIN 300 MG PO CAPS: 600.0000 mg | ORAL_CAPSULE | Freq: Two times a day (BID) | ORAL | Status: DC

## 2022-11-28 MED ORDER — ATORVASTATIN CALCIUM 40 MG PO TABS: 40.0000 mg | ORAL_TABLET | Freq: Every day | ORAL | Status: AC

## 2022-11-28 MED ORDER — VENLAFAXINE HCL ER 150 MG PO CP24: 150.0000 mg | ORAL_CAPSULE | Freq: Every day | ORAL | Status: DC

## 2022-11-28 MED ORDER — VITAMIN D (ERGOCALCIFEROL) 1.25 MG (50000 UNIT) PO CAPS: 50000.0000 [IU] | ORAL_CAPSULE | ORAL | 0 refills | Status: AC

## 2022-11-28 NOTE — TOC Transition Note (Signed)
Transition of Care [facility]) - CM/SW Discharge Note   Patient Details  Name: Shane Chambers MRN: [id number] Date of Birth: 07/15/1961  [facility] CM/SW Contact:  Candie Chroman, LCSW Phone Number: 11/28/2022, 1:06 PM   Clinical Narrative:   Patient has orders to discharge back to [facility]. RN will call report to [phone] (Room 421-B). EMS transport has been arranged and he is next on the list. No further concerns. CSW signing off.  Final next level of care: Skilled Nursing Facility Barriers to Discharge: Barriers Resolved   Patient Goals and CMS Choice   Choice offered to / list presented to : Patient  Discharge Placement     Existing PASRR number confirmed : 11/11/22          Patient chooses bed at: Other - please specify in the comment section below: [facility]) Patient to be transferred to facility by: EMS Name of family member notified: Left voicemail for mother, Robben Jagiello Patient and family notified of of transfer: 11/28/22  Discharge Plan and Services Additional resources added to the After Visit Summary for                                       Social Determinants of Health (SDOH) Interventions SDOH Screenings   Food Insecurity: No Food Insecurity (11/13/2022)  Housing: Low Risk  (11/13/2022)  Transportation Needs: No Transportation Needs (11/13/2022)  Utilities: Not At Risk (11/13/2022)  Alcohol Screen: Low Risk  (11/05/2022)  Tobacco Use: Medium Risk (11/20/2022)     Readmission Risk Interventions    11/11/2022    2:05 PM 10/03/2022   10:08 AM 09/26/2022    1:16 PM  Readmission Risk Prevention Plan  Transportation Screening Complete Complete Complete  PCP or Specialist Appt within 3-5 Days   Complete  HRI or [place]   Complete  Social Work Consult for [place] Planning/Counseling   Complete  Palliative Care Screening   Not Applicable  Medication Review Human resources officer)   Complete  HRI or Home Care Consult Complete Complete   SW Recovery Care/Counseling Consult Complete Complete   Palliative Care Screening Not Applicable Not Applicable   Skilled Nursing Facility Complete Complete

## 2022-11-28 NOTE — Progress Notes (Signed)
PHARMACY CONSULT NOTE FOR:  OUTPATIENT  PARENTERAL ANTIBIOTIC THERAPY (OPAT)  Indication: Osteomyelitis Regimen: Daptomycin 700 mg IV Q24H End date: 12/19/2022  IV antibiotic discharge orders are pended. To discharging provider:  please sign these orders via discharge navigator,  Select New Orders & click on the button choice - Manage This Unsigned Work.     Thank you for allowing pharmacy to be a part of this patient's care.  Alison Murray 11/28/2022, 11:07 AM

## 2022-11-28 NOTE — Progress Notes (Signed)
Peripherally Inserted Central Catheter Placement  The IV Nurse has discussed with the patient and/or persons authorized to consent for the patient, the purpose of this procedure and the potential benefits and risks involved with this procedure.  The benefits include less needle sticks, lab draws from the catheter, and the patient may be discharged home with the catheter. Risks include, but not limited to, infection, bleeding, blood clot (thrombus formation), and puncture of an artery; nerve damage and irregular heartbeat and possibility to perform a PICC exchange if needed/ordered by physician.  Alternatives to this procedure were also discussed.  Bard Power PICC patient education guide, fact sheet on infection prevention and patient information card has been provided to patient /or left at bedside.    PICC Placement Documentation  PICC Single Lumen 55/73/22 Right Basilic 41 cm 0 cm (Active)  Indication for Insertion or Continuance of Line Home intravenous therapies (PICC only) 11/28/22 1139  Exposed Catheter (cm) 0 cm 11/28/22 1139  Site Assessment Clean, Dry, Intact 11/28/22 1139  Line Status Flushed;Saline locked;Blood return noted 11/28/22 1139  Dressing Type Transparent;Securing device 11/28/22 1139  Dressing Status Antimicrobial disc in place;Clean, Dry, Intact 11/28/22 1139  Safety Lock Not Applicable 02/54/27 0623  Line Care Connections checked and tightened 11/28/22 1139  Line Adjustment (NICU/IV Team Only) No 11/28/22 1139  Dressing Intervention New dressing 11/28/22 1139  Dressing Change Due 12/05/22 11/28/22 [place] 11/28/2022, 11:43 AM

## 2022-11-28 NOTE — Discharge Summary (Signed)
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 Schedule an appointment as soon as possible for a visit.   Specialty: Orthopedic Surgery Why: Patient to follow up with Shane Chambers one week after he is discharged from [facility] for wound care follow up. Contact information: [place] [address] [phone]              Contact information for after-discharge care     Destination     [facility] .   Service: Skilled Nursing Contact information: [address] [place] [phone]                    Diet recommendation: Carb modified diet  Activity: The patient is advised to gradually reintroduce usual activities, as tolerated  Discharge Condition: stable  Code Status: Full code   History of present illness: As per the H and P dictated on admission Hospital Course:  Shane Chambers is a 62 y.o. male with past medical history of CAD s/p CABG, HFrEF with recovered EF, PVD, type 2 diabetes, COPD, hypertension, hyperlipidemia, hypothyroidism, depression who is current he admitted for suicidal ideation and found to have complication of his  amputation site.  Shane Chambers states that for the last 3 days, he has been experiencing increasing pain of his right BKA stump in addition to increasing erythema.  His nurse at bedside states that he has been picking at it. Patient has not noticed any purulent drainage at this time but there are areas where he is worried that the incision is opening. Per chart review, patient was admitted on 11/23 for cellulitis of the right lower extremity extending from the foot up towards the knee.  CT imaging at that time demonstrated holdable complex fluid collections and possible medial malleolus osteomyelitis.  MRI was subsequently obtained that demonstrated right ankle septic arthritis.  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  He followed up with orthopedic surgery on 12/14.  At that time, he was instructed to follow-up in 1 week for staple removal.   Assessment and Plan:  # Right BKA stump abscess/MRSA wound infection, s/p revision and washout done x 2 on 11/14/2022 and 11/18/22 by Vasc Sx H/o Acute osteomyelitis, s/p right BKA Initially patient was seen in consultation at [facility], however required transfer to the medical unit due to inability to administer IV antibiotics.  Patient presented with 3 days of gradually worsening right stump erythema and pain.  Initial concern for cellulitis or only, however vascular surgery was able to put a probe down to the bone with 200 cc of purulent drainage, found to have abscess which was washed out. Vascular surgery consulted,  On 1/5 s/p revision of right BKA stump, washout of Abscess in the posterior portion of the below-knee amputation stump, Deep wound culture to microbiology. S/p ceftriaxone and metronidazole, which were discontinued by ID 1/8 wound culture growing MRSA, discontinued ceftriaxone.   Started gabapentin, patient was taking 6000 mg p.o. twice daily, continue Tylenol and oxycodone as needed for pain control.  Continue wound care, wound care RN consult was  consulted. On 1/9 s/p Excisional debridement right below-knee amputation stump. 1/10 seen by vascular surgery, recommended daily dressing changes. Wet-to-dry's with gauze packing the wound covered with ABDs and wrapped in Kerlix.  1/12 DC'd IV vancomycin due to elevated functions and started on daptomycin by ID.  Patient was cleared by vascular surgery to discharge.  ID recommended total 4 weeks of daptomycin, started on 11/22/2022 till 12/19/2022.  Remove PICC line on 12/20/2022.  Weekly labs while on antibiotics as mentioned above.   # Acute hyper respiratory failure developed on 1/16, resolved # Pulmonary edema, complaining of shortness of breath on 1/16 CXR consistent with pulmonary edema and pleural effusion S/p Lasix IV 20 mg x 1 dose followed by Lasix IV infusion,d/c'd on 1/18 On 1/18 discontinued IV Lasix, resumed torsemide home dose on discharge.  Watch for urine output and monitor renal functions and titrate dose accordingly. # Constipation, Resolved.  Patient received multiple doses of laxatives and enema and suppository.  So patient developed diarrhea which has been resolved.  Use MiraLAX and Dulcolax as needed. # Elevated creatinine could be due to dehydration and Patient was on vancomycin, pharmacy was managing dosing according to renal functions.  Vancomycin was discontinued by ID and patient was started on daptomycin.  We held torsemide home dose and patient became volume overload so we started him on Lasix IV infusion, renal functions improved, resumed home dose torsemide, continue to monitor urine output and renal functions.  # Hypomagnesemia, mag repleted.  Resolved.  Continue oral supplement. 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  EKG with improved QTc.  # Hypertension, continue amlodipine, decrease dose of Lopressor due to bradycardia.  Continue to monitor BP and heart rate and titrate medication accordingly. # HLD, decrease Lipitor from  80-40 due to risk of rhabdomyolysis as patient is on daptomycin.  Dose can be increased back to 80 mg if needed after completion of antibiotics. # Depression, seen by psych, patient was started on Continue Abilify and Effexor.  Follow with psych as an outpatient. # Type 2 diabetes mellitus, resumed home regimen, monitor FSBG, continue diabetic diet. # Hypothyroid, continued Synthroid # BPH, continue Flomax # Vitamin D insufficiency: started vitamin D 50,000 units p.o. weekly, follow with PCP to repeat vitamin D level after 3 to 6 months. # Anemia secondary to iron deficiency. Continue oral iron supplement with vitamin C, repeat iron profile after 3 to 6 months. 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  Recommended to follow with neurology for EMG and NCS test as an outpatient  Consultants:  Vascular surgery ID Psych  Procedures:  1/5 s/p revision of right BKA stump, washout of Abscess in the posterior portion of the below-knee amputation stump, Deep wound culture to microbiology  1/9 Excisional debridement right below-knee amputation stump   Antimicrobials:  1/13 --- 12/19/22 Daptomycin S/p Vancomycin till 1/12  Body mass index is 36.04 kg/m.  Nutrition Problem: Increased nutrient needs Etiology: wound healing Nutrition Interventions:    Pressure Injury 11/06/22 Buttocks Right Stage 2 -  Partial thickness loss of dermis presenting as a shallow open injury with a red, pink wound bed without slough. no odor. no drainage. no slough. 1cm X 0.3cm (Active)  11/06/22 1800  Location: Buttocks  Location Orientation: Right  Staging: Stage 2 -  Partial thickness loss of dermis presenting as a shallow open injury with a red, pink wound bed without slough.  Wound Description (Comments): no odor. no drainage. no slough. 1cm X  0.3cm  Present on Admission:   Dressing Type Foam - Lift dressing to assess site every shift 11/27/22 2021     - [facility] could not review as website was down.  Patient was prescribed oxycodone 5 mg p.o. every 6 hourly 10 pills  - Patient was instructed, not to drive, operate heavy machinery, perform activities at heights, swimming or participation in water activities or provide baby sitting services while on Pain, Sleep and Anxiety Medications; until his outpatient Physician has advised to do so again.  - Also recommended to not to take more than prescribed Pain, Sleep and Anxiety Medications.  Patient was seen by physical therapy, who recommended SNF, which was arranged. On the day of the discharge the patient's vitals were stable, and no other acute medical condition were reported by patient. the patient was felt safe to be discharge at SNF with Home health.   Discharge Exam: General: Appear in no distress, no Rash; Oral Mucosa Clear, moist. Cardiovascular: S1 and S2 Present, no Murmur, Respiratory: normal respiratory effort, Bilateral Air entry present and no Crackles, no wheezes Abdomen: Bowel Sound present, Soft and no tenderness, no hernia Extremities: no Pedal edema, no calf tenderness,s/p Righ BKA, dressing CDI Neurology: alert and oriented to time, place, and person affect appropriate.  Atrophy of bilateral hand muscles  Filed Weights   11/24/22 0500 11/26/22 0500 11/28/22 0500  Weight: 111.1 kg 106.3 kg 110.7 kg   Vitals:   11/28/22 0423 11/28/22 0828  BP:  (!) 142/65  Pulse:  72  Resp:  20  Temp:  98 F (36.7 C)  SpO2: 96% 99%    DISCHARGE MEDICATION: Allergies as of 11/28/2022       Reactions   Ace Inhibitors Swelling, Cough   (Not on MAR at [facility] [place])   Haloperidol And Related    Do NOT give anti-psychotics due to risk of  Torsades and QT prolongation (per Shane. Graciela Husbands  request)   Other Itching   [name]   Shellfish Allergy    Penicillins Itching, Rash   Has patient had a PCN reaction causing immediate rash, facial/tongue/throat swelling, SOB or lightheadedness with hypotension: Unknown Has patient had a PCN reaction causing severe rash involving mucus membranes or skin necrosis: Unknown Has patient had a PCN reaction that required hospitalization: Unknown Has patient had a PCN reaction occurring within the last 10 years: Unknown If all of the above answers are "NO", then may proceed with Cephalosporin use.        Medication List     STOP taking these medications    docusate sodium 100 MG capsule Commonly known as: COLACE   loperamide HCl 2 MG/15ML solution Commonly known as: IMODIUM       TAKE these medications    acetaminophen 500 MG tablet Commonly known as: TYLENOL Take  1,000 mg by mouth every 8 (eight) hours as needed for fever or moderate pain.   albuterol 108 (90 Base) MCG/ACT inhaler Commonly known as: VENTOLIN HFA Inhale 2 puffs into the lungs every 6 (six) hours as needed for wheezing or shortness of breath.   alum & mag hydroxide-simeth 200-200-20 MG/5ML suspension Commonly known as: MAALOX/MYLANTA Take 30 mLs by mouth every 6 (six) hours as needed for indigestion or heartburn.   amLODipine 10 MG tablet Commonly known as: NORVASC Take 1 tablet (10 mg total) by mouth daily.   ARIPiprazole 10 MG tablet Commonly known as: ABILIFY Take 1 tablet (10 mg total) by mouth daily. Start taking on: November 29, 2022   ascorbic acid 500 MG tablet Commonly known as: VITAMIN C Take 1 tablet (500 mg total) by mouth 2 (two) times daily.   aspirin EC 81 MG tablet Take 1 tablet (81 mg total) by mouth daily with breakfast.   atorvastatin 40 MG tablet Commonly known as: LIPITOR Take 1 tablet (40 mg total) by mouth at bedtime. What changed:  medication strength how much to take   budesonide-formoterol 160-4.5 MCG/ACT  inhaler Commonly known as: Symbicort Inhale 2 puffs into the lungs in the morning and at bedtime.   Cholecalciferol 125 MCG (5000 UT) Tabs Take 1 tablet by mouth once a week. On Mondays   clotrimazole-betamethasone cream Commonly known as: LOTRISONE Apply 1 Application topically 2 (two) times daily.   daptomycin  IVPB Commonly known as: CUBICIN Inject 700 mg into the vein daily for 28 days. Indication:  Osteomyelitis First Dose: Yes Last Day of Therapy:  12/19/2022 Labs - Once weekly:  CBC/D, CMP, and CPK Labs - Every other week:  ESR and CRP Method of administration: IV Push Method of administration may be changed at the discretion of home infusion pharmacist based upon assessment of the patient and/or caregiver's ability to self-administer the medication ordered.   feeding supplement Liqd Take 237 mLs by mouth 3 (three) times daily between meals.   FERROUS GLUCONATE PO Take 324 mg by mouth daily.   gabapentin 300 MG capsule Commonly known as: NEURONTIN Take 2 capsules (600 mg total) by mouth 2 (two) times daily.   ipratropium-albuterol 0.5-2.5 (3) MG/3ML Soln Commonly known as: DUONEB Take 3 mLs by nebulization every 6 (six) hours as needed.   levothyroxine 75 MCG tablet Commonly known as: SYNTHROID Take 75 mcg by mouth daily before breakfast.   magnesium oxide 400 MG tablet Commonly known as: MAG-OX Take 400 mg by mouth daily.   metFORMIN 1000 MG tablet Commonly known as: GLUCOPHAGE Take 1,000 mg by mouth 2 (two) times daily.   metoprolol tartrate 50 MG tablet Commonly known as: LOPRESSOR Take 1 tablet (50 mg total) by mouth 2 (two) times daily. What changed:  medication strength how much to take   multivitamin tablet Take 1 tablet by mouth daily.   NovoLOG FlexPen 100 UNIT/ML FlexPen Generic drug: insulin aspart 0-15 Units, Subcutaneous, 3 times daily with meals CBG < 70: implement hypoglycemia protocol-call MD CBG 70 - 120: 0 units CBG 121 - 150: 2  units CBG 151 - 200: 3 units CBG 201 - 250: 5 units CBG 251 - 300: 8 units CBG 301 - 350: 11 units CBG 351 - 400: 15 units CBG > 400:   omeprazole 40 MG capsule Commonly known as: PRILOSEC Take 40 mg by mouth daily.   oxyCODONE 5 MG immediate release tablet Commonly known as: Oxy IR/ROXICODONE Take 1 tablet (5 mg  total) by mouth every 6 (six) hours as needed for severe pain or breakthrough pain.   polyethylene glycol 17 g packet Commonly known as: MIRALAX / GLYCOLAX Take 17 g by mouth daily as needed for moderate constipation.   polyvinyl alcohol 1.4 % ophthalmic solution Commonly known as: LIQUIFILM TEARS Place 1 drop into both eyes daily.   potassium chloride SA 20 MEQ tablet Commonly known as: KLOR-CON M Take 1 tablet (20 mEq total) by mouth daily.   senna-docusate 8.6-50 MG tablet Commonly known as: Senokot-S Take 1 tablet by mouth 2 (two) times daily.   tamsulosin 0.4 MG Caps capsule Commonly known as: FLOMAX Take 0.8 mg by mouth daily.   thiamine 100 MG tablet Commonly known as: Vitamin B-1 Take 1 tablet (100 mg total) by mouth daily.   torsemide 20 MG tablet Commonly known as: DEMADEX Take 2 tablets (40 mg total) by mouth daily.   venlafaxine XR 150 MG 24 hr capsule Commonly known as: EFFEXOR-XR Take 1 capsule (150 mg total) by mouth daily with breakfast. Start taking on: November 29, 2022   Vitamin D (Ergocalciferol) 1.25 MG (50000 UNIT) Caps capsule Commonly known as: DRISDOL Take 1 capsule (50,000 Units total) by mouth every 7 (seven) days. Start taking on: December 03, 2022               Discharge Care Instructions  (From admission, onward)           Start     Ordered   11/28/22 0000  Discharge wound care:       Comments: As above   11/28/22 1056   11/28/22 0000  Change dressing on IV access line weekly and PRN  (Home infusion instructions - [facility] )        11/28/22 1203           Allergies  Allergen Reactions    Ace Inhibitors Swelling and Cough    (Not on MAR at [facility])   Haloperidol And Related     Do NOT give anti-psychotics due to risk of  Torsades and QT prolongation (per Shane. Graciela Husbands request)   Other Itching    [name]   Shellfish Allergy    Penicillins Itching and Rash    Has patient had a PCN reaction causing immediate rash, facial/tongue/throat swelling, SOB or lightheadedness with hypotension: Unknown Has patient had a PCN reaction causing severe rash involving mucus membranes or skin necrosis: Unknown Has patient had a PCN reaction that required hospitalization: Unknown Has patient had a PCN reaction occurring within the last 10 years: Unknown If all of the above answers are "NO", then may proceed with Cephalosporin use.    Discharge Instructions     [facility] pharmacist to adjust dose for Vancomycin, Aminoglycosides and other anti-infective therapies as requested by physician.   Complete by: As directed    [facility] to provide Cath Flo '2mg'$    Complete by: As directed    Administer for PICC line occlusion and as ordered by physician for other access device issues.   Anaphylaxis Kit: Provided to treat any anaphylactic reaction to the medication being provided to the patient if First Dose or when requested by physician   Complete by: As directed    Epinephrine '1mg'$ /ml vial / amp: Administer 0.'3mg'$  (0.45m) subcutaneously once for moderate to severe anaphylaxis, nurse to call physician and pharmacy when reaction occurs and call 911 if needed for immediate care  Diphenhydramine '50mg'$ /ml IV vial: Administer 25-'50mg'$  IV/IM PRN for first dose reaction, rash, itching, mild reaction, nurse to call physician and pharmacy when reaction occurs   Sodium Chloride 0.9% NS 519m IV: Administer if needed for hypovolemic blood pressure drop or as ordered by physician after call to physician with anaphylactic reaction   Call MD for:   difficulty breathing, headache or visual disturbances   Complete by: As directed    Call MD for:  extreme fatigue   Complete by: As directed    Call MD for:  persistant dizziness or light-headedness   Complete by: As directed    Call MD for:  severe uncontrolled pain   Complete by: As directed    Call MD for:  temperature >100.4   Complete by: As directed    Change dressing on IV access line weekly and PRN   Complete by: As directed    Diet - low sodium heart healthy   Complete by: As directed    Discharge instructions   Complete by: As directed    F/u PCP in 1-2 days, should be seen by am MD F/u ID F/u Vascular surgery in 1-2 weeks for wound check   Discharge wound care:   Complete by: As directed    As above   Flush IV access with Sodium Chloride 0.9% and Heparin 10 units/ml or 100 units/ml   Complete by: As directed    Home infusion instructions - [facility]   Complete by: As directed    Instructions: Flush IV access with Sodium Chloride 0.9% and Heparin 10units/ml or 100units/ml   Change dressing on IV access line: Weekly and PRN   Instructions Cath Flo '2mg'$ : Administer for PICC Line occlusion and as ordered by physician for other access device   [facility] pharmacist to adjust dose for: Vancomycin, Aminoglycosides and other anti-infective therapies as requested by physician   Increase activity slowly   Complete by: As directed    Method of administration may be changed at the discretion of home infusion pharmacist based upon assessment of the patient and/or caregiver's ability to self-administer the medication ordered   Complete by: As directed        The results of significant diagnostics from this hospitalization (including imaging, microbiology, ancillary and laboratory) are listed below for reference.    Significant Diagnostic Studies: [place] SITE RITE  Result Date: 11/28/2022 If Site Rite image not attached, placement could not be confirmed  due to current cardiac rhythm.  [place] Img Lower Unilateral Left (DVT)  Result Date: 11/27/2022 CLINICAL DATA:  Left ankle edema. EXAM: LEFT LOWER EXTREMITY VENOUS DOPPLER ULTRASOUND TECHNIQUE: Gray-scale sonography with graded compression, as well as color Doppler and duplex ultrasound were performed to evaluate the lower extremity deep venous systems from the level of the common femoral vein and including the common femoral, femoral, profunda femoral, popliteal and calf veins including the posterior tibial, peroneal and gastrocnemius veins when visible. The superficial great saphenous vein was also interrogated. Spectral Doppler was utilized to evaluate flow at rest and with distal augmentation maneuvers in the common femoral, femoral and popliteal veins. COMPARISON:  None Available. FINDINGS: Contralateral Common Femoral Vein: Respiratory phasicity is normal and symmetric with the symptomatic side. No evidence of thrombus. Normal compressibility. Common Femoral Vein: No evidence of thrombus. Normal compressibility, respiratory phasicity and response to augmentation. Saphenofemoral Junction: No evidence of thrombus. Normal compressibility and flow on color Doppler imaging. Profunda Femoral Vein: No evidence of thrombus. Normal  compressibility and flow on color Doppler imaging. Femoral Vein: No evidence of thrombus. Normal compressibility, respiratory phasicity and response to augmentation. Popliteal Vein: No evidence of thrombus. Normal compressibility, respiratory phasicity and response to augmentation. Calf Veins: No evidence of thrombus. Normal compressibility and flow on color Doppler imaging. Superficial Great Saphenous Vein: No evidence of thrombus. Normal compressibility. Venous Reflux:  None. Other Findings: No evidence of superficial thrombophlebitis or abnormal fluid collection. IMPRESSION: No evidence of left lower extremity deep venous thrombosis. Electronically Signed   By: Aletta Edouard M.D.    On: 11/27/2022 15:47   DG Chest Port 1 View  Result Date: 11/25/2022 CLINICAL DATA:  Shortness of breath. EXAM: PORTABLE CHEST 1 VIEW COMPARISON:  10/12/2022 FINDINGS: Previous median sternotomy. Interval removal of feeding tube. Stable cardiac enlargement. Unchanged asymmetric elevation of the right hemidiaphragm. Mild diffuse interstitial edema noted. No definite pleural effusion or airspace disease. IMPRESSION: 1. Mild diffuse interstitial edema. 2. Interval removal of feeding tube. Electronically Signed   By: Kerby Moors M.D.   On: 11/25/2022 10:06   DG Abd 1 View  Result Date: 11/21/2022 CLINICAL DATA:  Lower abdominal pain. Constipation. Concern for impaction. EXAM: ABDOMEN - 1 VIEW COMPARISON:  One-view abdomen 10/10/2022 FINDINGS: The heart is enlarged. Mild airspace opacity at the right base likely reflects atelectasis. Moderate stool is present throughout the colon. The rectum is not dilated. Small bowel gas pattern is within normal limits. Vascular calcifications are noted. IMPRESSION: 1. Moderate stool throughout the colon without evidence for obstruction. 2. Cardiomegaly without failure. Electronically Signed   By: San Morelle M.D.   On: 11/21/2022 17:24    Microbiology: No results found for this or any previous visit (from the past 240 hour(s)).   Labs: CBC: Recent Labs  Lab 11/24/22 0440 11/25/22 0508 11/26/22 0449 11/27/22 0506 11/28/22 0512  WBC 14.1* 12.1* 9.5 8.6 7.9  HGB 9.1* 8.2* 7.5* 7.5* 7.9*  HCT 28.7* 25.8* 23.5* 23.3* 25.1*  MCV 91.1 91.2 91.4 92.5 92.6  PLT 349 302 289 291 287   Basic Metabolic Panel: Recent Labs  Lab 11/24/22 0440 11/25/22 0508 11/26/22 0449 11/27/22 0506 11/28/22 0512  NA 138 135 135 135 134*  K 4.2 4.2 3.9 4.4 4.3  CL 104 103 104 102 103  CO2 '23 23 23 23 23  '$ GLUCOSE 140* 129* 110* 110* 124*  BUN 75* 84* 82* 86* 85*  CREATININE 1.25* 1.23 1.08 1.29* 1.31*  CALCIUM 9.2 8.9 9.1 8.7* 8.8*  MG  --  2.4 2.2 2.4  --    PHOS  --  5.9* 5.9* 6.3*  --    Liver Function Tests: No results for input(s): "AST", "ALT", "ALKPHOS", "BILITOT", "PROT", "ALBUMIN" in the last 168 hours. No results for input(s): "LIPASE", "AMYLASE" in the last 168 hours. No results for input(s): "AMMONIA" in the last 168 hours. Cardiac Enzymes: Recent Labs  Lab 11/22/22 0603 11/25/22 0508 11/28/22 0512  CKTOTAL 34* 53 49   BNP (last 3 results) Recent Labs    11/25/22 1101  BNP 695.7*   CBG: Recent Labs  Lab 11/27/22 1334 11/27/22 1726 11/27/22 2129 11/28/22 0812 11/28/22 1144  GLUCAP 159* 151* 165* 109* 119*    Time spent: 35 minutes  Signed:  Val Riles  [facility]  11/28/2022 12:11 PM

## 2022-11-28 NOTE — Care Management Important Message (Signed)
Important Message  Patient Details  Name: Shane Chambers MRN: [id number] Date of Birth: Dec 04, 1960   Medicare Important Message Given:  Yes  Reviewed Medicare IM with patient via room phone [phone]).  Declined receiving copy at this time.     Dannette Barbara 11/28/2022, 12:48 PM

## 2022-11-28 NOTE — Consult Note (Addendum)
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 Pt has been followed by the Vascular team for a post-op wound. Full thickness wound is 90% red, 10% yellow at the edges; 2.5X13X2cm, small amt pink drainage, no odor.  Some retention sutures remain in place to outer wound edges.  Dressing procedure/placement/frequency: Agree with present plan of care with moist gauze dressings.  Please re-consult if further assistance is needed.  Thank-you,  Julien Girt MSN, [place], [place], [place], [place]

## 2022-11-28 NOTE — Treatment Plan (Signed)
Diagnosis: MRSA rt BKA stump infection Baseline Creatinine 1.25    Allergies  Allergen Reactions   Ace Inhibitors Swelling and Cough    (Not on MAR at [facility])   Haloperidol And Related     Do NOT give anti-psychotics due to risk of  Torsades and QT prolongation (per Dr. Graciela Husbands request)   Other Itching    [name]   Shellfish Allergy    Penicillins Itching and Rash    Has patient had a PCN reaction causing immediate rash, facial/tongue/throat swelling, SOB or lightheadedness with hypotension: Unknown Has patient had a PCN reaction causing severe rash involving mucus membranes or skin necrosis: Unknown Has patient had a PCN reaction that required hospitalization: Unknown Has patient had a PCN reaction occurring within the last 10 years: Unknown If all of the above answers are "NO", then may proceed with Cephalosporin use.     OPAT Orders Discharge antibiotics: Daptomycin '700mg'$  IV every 24 hours Duration: 4 weeks End Date: 12/19/22  [facility] Care Per Protocol:  Labs weekly while on IV antibiotics: _X_ CBC with differential  _X_ CMP _X CPK  _X_ Please pull PIC on 12/20/22 at completion of IV antibiotics    Fax weekly lab results  promptly to [phone]  Clinic Follow Up Appt: 12/18/22- 11.15 am- My chart Video visit   Call [phone] with any critical value or questions

## 2022-12-01 ENCOUNTER — Telehealth: Payer: Self-pay

## 2022-12-01 ENCOUNTER — Encounter (HOSPITAL_COMMUNITY): Payer: Self-pay | Admitting: Hematology

## 2022-12-01 ENCOUNTER — Encounter: Payer: Self-pay | Admitting: Internal Medicine

## 2022-12-01 ENCOUNTER — Ambulatory Visit: Payer: Medicare Other | Attending: Internal Medicine | Admitting: Internal Medicine

## 2022-12-01 VITALS — BP 134/68 | HR 72 | Temp 98.1°F | Resp 18 | Ht 69.0 in

## 2022-12-01 DIAGNOSIS — R9431 Abnormal electrocardiogram [ECG] [EKG]: Secondary | ICD-10-CM | POA: Diagnosis not present

## 2022-12-01 NOTE — Telephone Encounter (Signed)
  Patient Consent for Virtual Visit        Shane Chambers has provided verbal consent on 12/01/2022 for a virtual visit (video or telephone).   CONSENT FOR VIRTUAL VISIT FOR:  Shane Chambers  By participating in this virtual visit I agree to the following:  I hereby voluntarily request, consent and authorize [facility] and its employed or contracted physicians, physician assistants, nurse practitioners or other licensed health care professionals (the Practitioner), to provide me with telemedicine health care services (the "Services") as deemed necessary by the treating Practitioner. I acknowledge and consent to receive the Services by the Practitioner via telemedicine. I understand that the telemedicine visit will involve communicating with the Practitioner through live audiovisual communication technology and the disclosure of certain medical information by electronic transmission. I acknowledge that I have been given the opportunity to request an in-person assessment or other available alternative prior to the telemedicine visit and am voluntarily participating in the telemedicine visit.  I understand that I have the right to withhold or withdraw my consent to the use of telemedicine in the course of my care at any time, without affecting my right to future care or treatment, and that the Practitioner or I may terminate the telemedicine visit at any time. I understand that I have the right to inspect all information obtained and/or recorded in the course of the telemedicine visit and may receive copies of available information for a reasonable fee.  I understand that some of the potential risks of receiving the Services via telemedicine include:  Delay or interruption in medical evaluation due to technological equipment failure or disruption; Information transmitted may not be sufficient (e.g. poor resolution of images) to allow for appropriate medical decision making by the  Practitioner; and/or  In rare instances, security protocols could fail, causing a breach of personal health information.  Furthermore, I acknowledge that it is my responsibility to provide information about my medical history, conditions and care that is complete and accurate to the best of my ability. I acknowledge that Practitioner's advice, recommendations, and/or decision may be based on factors not within their control, such as incomplete or inaccurate data provided by me or distortions of diagnostic images or specimens that may result from electronic transmissions. I understand that the practice of medicine is not an exact science and that Practitioner makes no warranties or guarantees regarding treatment outcomes. I acknowledge that a copy of this consent can be made available to me via my patient portal ([place]), or I can request a printed copy by calling the office of [place].    I understand that my insurance will be billed for this visit.   I have read or had this consent read to me. I understand the contents of this consent, which adequately explains the benefits and risks of the Services being provided via telemedicine.  I have been provided ample opportunity to ask questions regarding this consent and the Services and have had my questions answered to my satisfaction. I give my informed consent for the services to be provided through the use of telemedicine in my medical care

## 2022-12-01 NOTE — Patient Instructions (Addendum)
Medication Instructions:  Your physician recommends that you continue on your current medications as directed. Please refer to the Current Medication list given to you today.  *If you need a refill on your cardiac medications before your next appointment, please call your pharmacy*   Lab Work: BMET today If you have labs (blood work) drawn today and your tests are completely normal, you will receive your results only by: [place] (if you have MyChart) OR A paper copy in the mail If you have any lab test that is abnormal or we need to change your treatment, we will call you to review the results.   Testing/Procedures: None ordered.    Follow-Up: At [facility], you and your health needs are our priority.  As part of our continuing mission to provide you with exceptional heart care, we have created designated Provider Care Teams.  These Care Teams include your primary Cardiologist (physician) and Advanced Practice Providers (APPs -  Physician Assistants and Nurse Practitioners) who all work together to provide you with the care you need, when you need it.  We recommend signing up for the patient portal called "MyChart".  Sign up information is provided on this After Visit Summary.  MyChart is used to connect with patients for Virtual Visits (Telemedicine).  Patients are able to view lab/test results, encounter notes, upcoming appointments, etc.  Non-urgent messages can be sent to your provider as well.   To learn more about what you can do with MyChart, go to [email or web address].    Your next appointment:   Appointment with Dr Lovena Le 03/05/2023 at 1030am.  Call placed to Rehab facility and spoke with pt's nurse, Eduard Clos.  Advised of need for BMET and appointment with Dr Lovena Le as above.  Pt's nurse thanked RN for the call.  [email or web address]  to check medications

## 2022-12-01 NOTE — Progress Notes (Signed)
Electrophysiology TeleHealth Note      Date:  12/01/2022   ID:  Shane, Chambers 20-Jan-1961, MRN [id number]  Location: patient's home  Provider location: [address] [place]  Evaluation Performed: Follow-up visit  PCP:  Caprice Renshaw, MD  Cardiologist:   JBr Electrophysiologist:  GT  Chief Complaint:  TdP   History of Present Illness:    Shane Chambers is a 62 y.o. male who presents via audio/video conferencing for a telehealth visit today.  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    Past Medical History:  Diagnosis Date   Anemia    Arthritis    CAD (coronary artery disease)    a. s/p CABG in 03/2020 with LIMA-LAD, RIMA-PL, RA-D1-RI-OM1   Cancer ([place])    rectal   Cellulitis    COPD (chronic obstructive pulmonary disease) ([place])    Depression    Diabetes mellitus without complication (HCC)    GERD (gastroesophageal reflux disease)    History of kidney stones    Hyperlipidemia 12/01/2019   Hypertension    Hypothyroidism    Ischemic cardiomyopathy    a. EF 20-25% by echo in 02/2020 b. at 40% by echo in 03/2020 c. EF normalized to 60-65% by echo in 04/2020   Myocardial infarction [facility])    Peripheral vascular disease ([place])    Sleep apnea    Type 2 diabetes mellitus ([place])     Past Surgical History:  Procedure Laterality Date   AMPUTATION Right 10/05/2022   Procedure: AMPUTATION BELOW KNEE;  Surgeon: Newt Minion, MD;  Location: [place];  Service: Orthopedics;  Laterality: Right;   AMPUTATION Right 11/14/2022   Procedure: AMPUTATION BELOW KNEE REVISION; WASHOUT;  Surgeon: Katha Cabal, MD;  Location: [facility];  Service: Vascular;  Laterality: Right;   AMPUTATION Right 11/18/2022   Procedure: AMPUTATION BELOW KNEE REVISION AND CLOSURE;  Surgeon: Katha Cabal, MD;  Location: [facility];  Service: Vascular;  Laterality: Right;   APPLICATION OF WOUND VAC Right 10/05/2022   Procedure: APPLICATION OF WOUND VAC;  Surgeon: Newt Minion, MD;  Location: [place];  Service: Orthopedics;  Laterality: Right;   BIOPSY  07/18/2021   Procedure: BIOPSY;  Surgeon: Eloise Harman, DO;  [facility];  Service: Endoscopy;;   BIOPSY  01/09/2022   Procedure: BIOPSY;  Surgeon: Eloise Harman, DO;  [facility];  Service: Endoscopy;;   COLONOSCOPY WITH PROPOFOL N/A 07/18/2021   [place]: 15 millimeter polyp removed from the sigmoid colon, 5 mm polyp removed from sigmoid colon.  Nonbleeding internal hemorrhoids.  Significant looping of the colon. sigmoid path showed invasive colonic adenocarcinoma involving tubular adenoma (invades to depth of 30m, carcinoma 16mfrom margin, no lymphovascular invasion, no poorly differentiated component.   CORONARY ARTERY BYPASS GRAFT N/A 03/13/2020   Procedure: CORONARY ARTERY BYPASS GRAFTING (CABG) times five using bilateral Internal mammary arteries and left radial artery;  Surgeon: AtFredrich Romans  Z, MD;  Location: [place];  Service: Open Heart Surgery;  Laterality: N/A;   DENTAL SURGERY     ESOPHAGOGASTRODUODENOSCOPY (EGD) WITH PROPOFOL N/A 07/18/2021   [place]: 1 gastric polyp status post biopsy, gastritis. gastric bx with slight chronic inflammation and no H.pyori. GEJ polypectomy with mild inflammation only   FLEXIBLE SIGMOIDOSCOPY N/A 08/26/2021   [place]: Nonbleeding internal hemorrhoids.  15 mm ulcers from previous polypectomy found in the rectum.  No  evidence of previous polyp.  Located 5 to 8 cm from anal verge.   FLEXIBLE SIGMOIDOSCOPY N/A 01/09/2022   Procedure: FLEXIBLE SIGMOIDOSCOPY;  Surgeon: Eloise Harman, DO;  [facility];  Service: Endoscopy;  Laterality: N/A;   POLYPECTOMY  07/18/2021   Procedure: POLYPECTOMY INTESTINAL;  Surgeon: Eloise Harman, DO;  [facility];  Service: Endoscopy;;   RADIAL ARTERY HARVEST Left 03/13/2020   Procedure: [place],;  Surgeon: [name], MD;  Location: [place];  Service: Open Heart Surgery;  Laterality: Left;   RIGHT/LEFT HEART CATH AND CORONARY ANGIOGRAPHY N/A 03/07/2020   Procedure: RIGHT/LEFT HEART CATH AND CORONARY ANGIOGRAPHY;  Surgeon: Sherren Mocha, MD;  Location: [facility];  Service: Cardiovascular;  Laterality: N/A;   SUBMUCOSAL LIFTING INJECTION  01/09/2022   Procedure: SUBMUCOSAL LIFTING INJECTION;  Surgeon: Eloise Harman, DO;  [facility];  Service: Endoscopy;;   SUBMUCOSAL TATTOO INJECTION  01/09/2022   Procedure: SUBMUCOSAL TATTOO INJECTION;  Surgeon: Eloise Harman, DO;  [facility];  Service: Endoscopy;;   TEE WITHOUT CARDIOVERSION N/A 03/13/2020   Procedure: TRANSESOPHAGEAL ECHOCARDIOGRAM (TEE);  Surgeon: [name], MD;  Location: [place];  Service: Open Heart Surgery;  Laterality: N/A;    Current Outpatient Medications  Medication Sig Dispense Refill   acetaminophen (TYLENOL) 500 MG tablet Take 1,000 mg by mouth every 8 (eight) hours as needed for fever or moderate pain.     albuterol (VENTOLIN HFA) 108 (90 Base) MCG/ACT inhaler Inhale 2 puffs into the lungs every 6 (six) hours as needed for wheezing or shortness of breath.     alum & mag hydroxide-simeth (MAALOX/MYLANTA) 200-200-20 MG/5ML suspension Take 30 mLs by mouth every 6 (six) hours as needed for indigestion or heartburn. 355 mL 0   amLODipine (NORVASC) 10 MG tablet Take 1 tablet (10 mg total) by mouth daily.     ARIPiprazole  (ABILIFY) 10 MG tablet Take 1 tablet (10 mg total) by mouth daily.     ascorbic acid (VITAMIN C) 500 MG tablet Take 1 tablet (500 mg total) by mouth 2 (two) times daily. 180 tablet 0   aspirin 81 MG EC tablet Take 1 tablet (81 mg total) by mouth daily with breakfast. 30 tablet 12   atorvastatin (LIPITOR) 40 MG tablet Take 1 tablet (40 mg total) by mouth at bedtime.     budesonide-formoterol (SYMBICORT) 160-4.5 MCG/ACT inhaler Inhale 2 puffs into the lungs in the morning and at bedtime. 1 Inhaler 6   Cholecalciferol 125 MCG (5000 UT) TABS Take 1 tablet by mouth once a week. On Mondays     clotrimazole-betamethasone (LOTRISONE) cream Apply 1 Application topically 2 (two) times daily.     daptomycin (CUBICIN) IVPB Inject 700 mg into the vein daily for 28 days. Indication:  Osteomyelitis First Dose: Yes Last Day of Therapy:  12/19/2022 Labs - Once weekly:  CBC/D, CMP, and CPK Labs - Every other week:  ESR and CRP Method of administration: IV Push Method of  administration may be changed at the discretion of home infusion pharmacist based upon assessment of the patient and/or caregiver's ability to self-administer the medication ordered. 22 Units 0   feeding supplement (ENSURE ENLIVE / ENSURE PLUS) LIQD Take 237 mLs by mouth 3 (three) times daily between meals. 237 mL 12   FERROUS GLUCONATE PO Take 324 mg by mouth daily.     gabapentin (NEURONTIN) 300 MG capsule Take 2 capsules (600 mg total) by mouth 2 (two) times daily.     insulin aspart (NOVOLOG FLEXPEN) 100 UNIT/ML FlexPen 0-15 Units, Subcutaneous, 3 times daily with meals CBG < 70: implement hypoglycemia protocol-call MD CBG 70 - 120: 0 units CBG 121 - 150: 2 units CBG 151 - 200: 3 units CBG 201 - 250: 5 units CBG 251 - 300: 8 units CBG 301 - 350: 11 units CBG 351 - 400: 15 units CBG > 400: 15 mL 0   ipratropium-albuterol (DUONEB) 0.5-2.5 (3) MG/3ML SOLN Take 3 mLs by nebulization every 6 (six) hours as needed. 360 mL    levothyroxine  (SYNTHROID) 75 MCG tablet Take 75 mcg by mouth daily before breakfast.     magnesium oxide (MAG-OX) 400 MG tablet Take 400 mg by mouth daily.     metFORMIN (GLUCOPHAGE) 1000 MG tablet Take 1,000 mg by mouth 2 (two) times daily.     metoprolol tartrate (LOPRESSOR) 50 MG tablet Take 1 tablet (50 mg total) by mouth 2 (two) times daily.     Multiple Vitamin (MULTIVITAMIN) tablet Take 1 tablet by mouth daily.     omeprazole (PRILOSEC) 40 MG capsule Take 40 mg by mouth daily.     oxyCODONE (OXY IR/ROXICODONE) 5 MG immediate release tablet Take 1 tablet (5 mg total) by mouth every 6 (six) hours as needed for severe pain or breakthrough pain. 10 tablet 0   polyethylene glycol (MIRALAX / GLYCOLAX) 17 g packet Take 17 g by mouth daily as needed for moderate constipation.     polyvinyl alcohol (LIQUIFILM TEARS) 1.4 % ophthalmic solution Place 1 drop into both eyes daily.     potassium chloride SA (KLOR-CON M) 20 MEQ tablet Take 1 tablet (20 mEq total) by mouth daily. 90 tablet 1   senna-docusate (SENOKOT-S) 8.6-50 MG tablet Take 1 tablet by mouth 2 (two) times daily.     tamsulosin (FLOMAX) 0.4 MG CAPS capsule Take 0.8 mg by mouth daily.     thiamine (VITAMIN B-1) 100 MG tablet Take 1 tablet (100 mg total) by mouth daily.     torsemide (DEMADEX) 20 MG tablet Take 2 tablets (40 mg total) by mouth daily. 90 tablet 6   venlafaxine XR (EFFEXOR-XR) 150 MG 24 hr capsule Take 1 capsule (150 mg total) by mouth daily with breakfast.     [START ON 12/03/2022] Vitamin D, Ergocalciferol, (DRISDOL) 1.25 MG (50000 UNIT) CAPS capsule Take 1 capsule (50,000 Units total) by mouth every 7 (seven) days. (Patient not taking: Reported on 12/01/2022) 12 capsule 0   No current facility-administered medications for this visit.    Allergies:   Ace inhibitors, Haloperidol and related, Other, Shellfish allergy, and Penicillins   Social History:  The patient  reports that he quit smoking about 27 years ago. His smoking use included  cigarettes. He smoked an average of 1.5 packs per day. He has never used smokeless tobacco. He reports that he does not currently use alcohol. He reports that he does not use drugs.   Family History:  The patient's  family history includes Hypertension in his mother.   ROS:  Please see the history of present illness.   All other systems are personally reviewed and negative.    Exam:    Vital Signs:  BP 134/68 Comment: pt provided  Pulse 72 Comment: pt provided  Temp 98.1 F (36.7 C) Comment: pt provided  Resp 18   Ht '5\' 9"'$  (1.753 m)   SpO2 97% Comment: pt provided  BMI 36.04 kg/m         Labs/Other Tests and Data Reviewed:    Recent Labs: 11/11/2022: ALT 10 11/25/2022: B Natriuretic Peptide 695.7 11/27/2022: Magnesium 2.4 11/28/2022: BUN 85; Creatinine, Ser 1.31; Hemoglobin 7.9; Platelets 310; Potassium 4.3; Sodium 134   Wt Readings from Last 3 Encounters:  10/27/22 259 lb 0.7 oz (117.5 kg)  10/15/22 259 lb 0.7 oz (117.5 kg)  10/01/22 260 lb 2.3 oz (118 kg)     Other studies personally reviewed: Additional studies/ records that were reviewed today include: hospital records  Review of the above records today demonstrates: (As above)  Prior radiographs:      ASSESSMENT & PLAN:    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   The patient does not have concerns regarding his medicines.  The following changes were made today:  none  (As above) [email or web address]  Labs/ tests ordered today include:  can we get them to draw a BMET please  No orders of the defined types were placed in  this encounter.     Today, I have spent 15 minutes with the patient with telehealth technology discussing the above.  Signed, Virl Axe, MD  12/01/2022 10:41 AM     [facility] [address] [place] [address] [phone] (office) [phone] (fax)

## 2022-12-09 ENCOUNTER — Other Ambulatory Visit: Payer: Self-pay

## 2022-12-09 ENCOUNTER — Emergency Department (HOSPITAL_COMMUNITY)
Admission: EM | Admit: 2022-12-09 | Discharge: 2022-12-10 | Disposition: A | Discharge status: Skilled Nursing Facility | Payer: Medicare Other | Attending: Emergency Medicine | Admitting: Emergency Medicine

## 2022-12-09 ENCOUNTER — Emergency Department (HOSPITAL_COMMUNITY): Payer: Medicare Other

## 2022-12-09 DIAGNOSIS — E039 Hypothyroidism, unspecified: Secondary | ICD-10-CM | POA: Insufficient documentation

## 2022-12-09 DIAGNOSIS — Z951 Presence of aortocoronary bypass graft: Secondary | ICD-10-CM | POA: Insufficient documentation

## 2022-12-09 DIAGNOSIS — Z79899 Other long term (current) drug therapy: Secondary | ICD-10-CM | POA: Diagnosis not present

## 2022-12-09 DIAGNOSIS — Z7984 Long term (current) use of oral hypoglycemic drugs: Secondary | ICD-10-CM | POA: Diagnosis not present

## 2022-12-09 DIAGNOSIS — I2510 Atherosclerotic heart disease of native coronary artery without angina pectoris: Secondary | ICD-10-CM | POA: Insufficient documentation

## 2022-12-09 DIAGNOSIS — E119 Type 2 diabetes mellitus without complications: Secondary | ICD-10-CM | POA: Insufficient documentation

## 2022-12-09 DIAGNOSIS — Z85048 Personal history of other malignant neoplasm of rectum, rectosigmoid junction, and anus: Secondary | ICD-10-CM | POA: Diagnosis not present

## 2022-12-09 DIAGNOSIS — R109 Unspecified abdominal pain: Secondary | ICD-10-CM | POA: Diagnosis present

## 2022-12-09 DIAGNOSIS — Z794 Long term (current) use of insulin: Secondary | ICD-10-CM | POA: Diagnosis not present

## 2022-12-09 DIAGNOSIS — J449 Chronic obstructive pulmonary disease, unspecified: Secondary | ICD-10-CM | POA: Diagnosis not present

## 2022-12-09 DIAGNOSIS — I10 Essential (primary) hypertension: Secondary | ICD-10-CM | POA: Insufficient documentation

## 2022-12-09 DIAGNOSIS — Z7951 Long term (current) use of inhaled steroids: Secondary | ICD-10-CM | POA: Insufficient documentation

## 2022-12-09 DIAGNOSIS — K5900 Constipation, unspecified: Secondary | ICD-10-CM | POA: Diagnosis not present

## 2022-12-09 DIAGNOSIS — Z7982 Long term (current) use of aspirin: Secondary | ICD-10-CM | POA: Diagnosis not present

## 2022-12-09 NOTE — ED Triage Notes (Signed)
Pt BIB [facility] from [place] [facility]. Reports constipation and abdominal pain x10 days. Per facility pt was given a suppository tonight and had a "large amount" of stool. Pt is currently on IV antibiotics for osteomyelitis.

## 2022-12-09 NOTE — ED Notes (Signed)
Pt to XR

## 2022-12-10 ENCOUNTER — Emergency Department (HOSPITAL_COMMUNITY): Payer: Medicare Other

## 2022-12-10 DIAGNOSIS — K5900 Constipation, unspecified: Secondary | ICD-10-CM | POA: Diagnosis not present

## 2022-12-10 LAB — CBC WITH DIFFERENTIAL/PLATELET
Abs Immature Granulocytes: 0.09 10*3/uL — ABNORMAL HIGH (ref 0.00–0.07)
Basophils Absolute: 0.1 10*3/uL (ref 0.0–0.1)
Basophils Relative: 0 %
Eosinophils Absolute: 0.5 10*3/uL (ref 0.0–0.5)
Eosinophils Relative: 4 %
HCT: 30.2 % — ABNORMAL LOW (ref 39.0–52.0)
Hemoglobin: 9.5 g/dL — ABNORMAL LOW (ref 13.0–17.0)
Immature Granulocytes: 1 %
Lymphocytes Relative: 8 %
Lymphs Abs: 1.1 10*3/uL (ref 0.7–4.0)
MCH: 29.3 pg (ref 26.0–34.0)
MCHC: 31.5 g/dL (ref 30.0–36.0)
MCV: 93.2 fL (ref 80.0–100.0)
Monocytes Absolute: 1 10*3/uL (ref 0.1–1.0)
Monocytes Relative: 7 %
Neutro Abs: 11.2 10*3/uL — ABNORMAL HIGH (ref 1.7–7.7)
Neutrophils Relative %: 80 %
Platelets: 393 10*3/uL (ref 150–400)
RBC: 3.24 MIL/uL — ABNORMAL LOW (ref 4.22–5.81)
RDW: 14.6 % (ref 11.5–15.5)
WBC: 14 10*3/uL — ABNORMAL HIGH (ref 4.0–10.5)
nRBC: 0 % (ref 0.0–0.2)

## 2022-12-10 LAB — COMPREHENSIVE METABOLIC PANEL
ALT: 21 U/L (ref 0–44)
AST: 79 U/L — ABNORMAL HIGH (ref 15–41)
Albumin: 3.5 g/dL (ref 3.5–5.0)
Alkaline Phosphatase: 121 U/L (ref 38–126)
Anion gap: 12 (ref 5–15)
BUN: 35 mg/dL — ABNORMAL HIGH (ref 6–20)
CO2: 24 mmol/L (ref 22–32)
Calcium: 9.4 mg/dL (ref 8.9–10.3)
Chloride: 101 mmol/L (ref 98–111)
Creatinine, Ser: 1.72 mg/dL — ABNORMAL HIGH (ref 0.61–1.24)
GFR, Estimated: 45 mL/min — ABNORMAL LOW (ref >60)
Glucose, Bld: 128 mg/dL — ABNORMAL HIGH (ref 70–99)
Potassium: 4.2 mmol/L (ref 3.5–5.1)
Sodium: 137 mmol/L (ref 135–145)
Total Bilirubin: 0.6 mg/dL (ref 0.3–1.2)
Total Protein: 7.7 g/dL (ref 6.5–8.1)

## 2022-12-10 LAB — LIPASE, BLOOD: Lipase: 47 U/L (ref 11–51)

## 2022-12-10 MED ORDER — PEG 3350-KCL-NABCB-NACL-NASULF 236 G PO SOLR: ORAL | 0 refills | Status: DC

## 2022-12-10 MED ORDER — FLEET ENEMA 7-19 GM/118ML RE ENEM
1.0000 | ENEMA | Freq: Once | RECTAL | Status: AC
  Administered 2022-12-10: 1 via RECTAL

## 2022-12-10 NOTE — ED Notes (Signed)
Pt holding in enema

## 2022-12-10 NOTE — ED Notes (Signed)
Small amount of stool as a result of the enema.

## 2022-12-10 NOTE — Discharge Instructions (Signed)
You were seen today in found to have constipation.  You likely would benefit from a significant bowel regimen and cleanout.  I prescribed a GoLytely regimen that is similar to when you prep for a colonoscopy.  Make sure that you are increasing fiber and fluids in your diet.  Avoid constipating medications.

## 2022-12-10 NOTE — ED Provider Notes (Signed)
[facility] Provider Note   CSN: [id number] Arrival date & time: 12/09/22  2304     History  Chief Complaint  Patient presents with   Abdominal Pain    Shane Chambers is a 62 y.o. male.  HPI     This is a 62 year old male who presents with concerns for abdominal pain and constipation.  He is currently in rehab and being treated for osteomyelitis.  States he has not had a good bowel movement in over 10 days.  Over the last 24 hours has had increasing abdominal pain and nausea.  No vomiting.  Reportedly was given a suppository tonight with some results.  He reports some rectal pressure.  No recent fevers.  Home Medications Prior to Admission medications   Medication Sig Start Date End Date Taking? Authorizing Provider  polyethylene glycol (GOLYTELY) 236 g solution Prepare and drink a glass (approx 8oz) every 10-15. 12/10/22  Yes Najib Colmenares, Barbette Hair, MD  acetaminophen (TYLENOL) 500 MG tablet Take 1,000 mg by mouth every 8 (eight) hours as needed for fever or moderate pain.    [provider]  albuterol (VENTOLIN HFA) 108 (90 Base) MCG/ACT inhaler Inhale 2 puffs into the lungs every 6 (six) hours as needed for wheezing or shortness of breath. 05/02/22   Johnson, Clanford L, MD  alum & mag hydroxide-simeth (MAALOX/MYLANTA) 200-200-20 MG/5ML suspension Take 30 mLs by mouth every 6 (six) hours as needed for indigestion or heartburn. 10/17/22   Ghimire, Henreitta Leber, MD  amLODipine (NORVASC) 10 MG tablet Take 1 tablet (10 mg total) by mouth daily. 10/18/22   Ghimire, Henreitta Leber, MD  ARIPiprazole (ABILIFY) 10 MG tablet Take 1 tablet (10 mg total) by mouth daily. 11/29/22   Val Riles, MD  ascorbic acid (VITAMIN C) 500 MG tablet Take 1 tablet (500 mg total) by mouth 2 (two) times daily. 11/28/22 02/26/23  Val Riles, MD  aspirin 81 MG EC tablet Take 1 tablet (81 mg total) by mouth daily with breakfast. 11/29/20   Roxan Hockey, MD   atorvastatin (LIPITOR) 40 MG tablet Take 1 tablet (40 mg total) by mouth at bedtime. 11/28/22   Val Riles, MD  budesonide-formoterol [facility]) 160-4.5 MCG/ACT inhaler Inhale 2 puffs into the lungs in the morning and at bedtime. 05/24/20   Rigoberto Noel, MD  Cholecalciferol 125 MCG (5000 UT) TABS Take 1 tablet by mouth once a week. On Mondays    [provider]  clotrimazole-betamethasone (LOTRISONE) cream Apply 1 Application topically 2 (two) times daily.    [provider]  daptomycin (CUBICIN) IVPB Inject 700 mg into the vein daily for 28 days. Indication:  Osteomyelitis First Dose: Yes Last Day of Therapy:  12/19/2022 Labs - Once weekly:  CBC/D, CMP, and CPK Labs - Every other week:  ESR and CRP Method of administration: IV Push Method of administration may be changed at the discretion of home infusion pharmacist based upon assessment of the patient and/or caregiver's ability to self-administer the medication ordered. 11/28/22 12/26/22  Val Riles, MD  feeding supplement (ENSURE ENLIVE / ENSURE PLUS) LIQD Take 237 mLs by mouth 3 (three) times daily between meals. 10/17/22   Ghimire, Henreitta Leber, MD  FERROUS GLUCONATE PO Take 324 mg by mouth daily. 09/23/22   [provider]  gabapentin (NEURONTIN) 300 MG capsule Take 2 capsules (600 mg total) by mouth 2 (two) times daily. 11/28/22   Val Riles, MD  insulin aspart (NOVOLOG FLEXPEN) 100 UNIT/ML  FlexPen 0-15 Units, Subcutaneous, 3 times daily with meals CBG < 70: implement hypoglycemia protocol-call MD CBG 70 - 120: 0 units CBG 121 - 150: 2 units CBG 151 - 200: 3 units CBG 201 - 250: 5 units CBG 251 - 300: 8 units CBG 301 - 350: 11 units CBG 351 - 400: 15 units CBG > 400: 10/17/22   Ghimire, Henreitta Leber, MD  ipratropium-albuterol (DUONEB) 0.5-2.5 (3) MG/3ML SOLN Take 3 mLs by nebulization every 6 (six) hours as needed. 10/17/22   Ghimire, Henreitta Leber, MD  levothyroxine (SYNTHROID) 75 MCG tablet Take 75 mcg by  mouth daily before breakfast.    [provider]  magnesium oxide (MAG-OX) 400 MG tablet Take 400 mg by mouth daily.    [provider]  metFORMIN (GLUCOPHAGE) 1000 MG tablet Take 1,000 mg by mouth 2 (two) times daily.    [provider]  metoprolol tartrate (LOPRESSOR) 50 MG tablet Take 1 tablet (50 mg total) by mouth 2 (two) times daily. 11/28/22   Val Riles, MD  Multiple Vitamin (MULTIVITAMIN) tablet Take 1 tablet by mouth daily.    [provider]  omeprazole (PRILOSEC) 40 MG capsule Take 40 mg by mouth daily. 01/01/22   [provider]  oxyCODONE (OXY IR/ROXICODONE) 5 MG immediate release tablet Take 1 tablet (5 mg total) by mouth every 6 (six) hours as needed for severe pain or breakthrough pain. 11/28/22   Val Riles, MD  polyethylene glycol (MIRALAX / GLYCOLAX) 17 g packet Take 17 g by mouth daily as needed for moderate constipation.    [provider]  polyvinyl alcohol (LIQUIFILM TEARS) 1.4 % ophthalmic solution Place 1 drop into both eyes daily.    [provider]  potassium chloride SA (KLOR-CON M) 20 MEQ tablet Take 1 tablet (20 mEq total) by mouth daily. 05/05/22   Johnson, Clanford L, MD  senna-docusate (SENOKOT-S) 8.6-50 MG tablet Take 1 tablet by mouth 2 (two) times daily. 10/17/22   Ghimire, Henreitta Leber, MD  tamsulosin (FLOMAX) 0.4 MG CAPS capsule Take 0.8 mg by mouth daily.    [provider]  thiamine (VITAMIN B-1) 100 MG tablet Take 1 tablet (100 mg total) by mouth daily. 10/18/22   Ghimire, Henreitta Leber, MD  torsemide (DEMADEX) 20 MG tablet Take 2 tablets (40 mg total) by mouth daily. 10/17/22   Ghimire, Henreitta Leber, MD  venlafaxine XR (EFFEXOR-XR) 150 MG 24 hr capsule Take 1 capsule (150 mg total) by mouth daily with breakfast. 11/29/22   Val Riles, MD  Vitamin D, Ergocalciferol, (DRISDOL) 1.25 MG (50000 UNIT) CAPS capsule Take 1 capsule (50,000 Units total) by mouth every 7 (seven) days. Patient not taking:  Reported on 12/01/2022 12/03/22 03/03/23  Val Riles, MD      Allergies    Ace inhibitors, Haloperidol and related, Other, Shellfish allergy, and Penicillins    Review of Systems   Review of Systems  Constitutional:  Negative for fever.  Respiratory:  Negative for shortness of breath.   Cardiovascular:  Negative for chest pain.  Gastrointestinal:  Positive for abdominal pain, constipation and nausea.  All other systems reviewed and are negative.   Physical Exam Updated Vital Signs BP 122/64   Pulse 71   Temp 98.5 F (36.9 C) (Oral)   Resp (!) 21   Ht 1.753 m ('5\' 9"'$ )   Wt 110 kg   SpO2 96%   BMI 35.81 kg/m  Physical Exam Vitals and nursing note reviewed.  Constitutional:  Appearance: He is well-developed.     Comments: Chronically ill-appearing, obese, no acute distress  HENT:     Head: Normocephalic and atraumatic.  Eyes:     Pupils: Pupils are equal, round, and reactive to light.  Cardiovascular:     Rate and Rhythm: Normal rate and regular rhythm.     Heart sounds: Normal heart sounds. No murmur heard. Pulmonary:     Effort: Pulmonary effort is normal. No respiratory distress.     Breath sounds: Normal breath sounds. No wheezing.  Abdominal:     General: Bowel sounds are normal.     Palpations: Abdomen is soft.     Tenderness: There is generalized abdominal tenderness. There is no guarding or rebound.     Comments: Minimal bowel sounds  Genitourinary:    Comments: Dilation of the anus with stool noted in the rectal vault Musculoskeletal:     Cervical back: Neck supple.     Comments: Right BKA  Lymphadenopathy:     Cervical: No cervical adenopathy.  Skin:    General: Skin is warm and dry.  Neurological:     Mental Status: He is alert and oriented to person, place, and time.  Psychiatric:        Mood and Affect: Mood normal.     ED Results / Procedures / Treatments   Labs (all labs ordered are listed, but only abnormal results are displayed) Labs  Reviewed  CBC WITH DIFFERENTIAL/PLATELET - Abnormal; Notable for the following components:      Result Value   WBC 14.0 (*)    RBC 3.24 (*)    Hemoglobin 9.5 (*)    HCT 30.2 (*)    Neutro Abs 11.2 (*)    Abs Immature Granulocytes 0.09 (*)    All other components within normal limits  COMPREHENSIVE METABOLIC PANEL - Abnormal; Notable for the following components:   Glucose, Bld 128 (*)    BUN 35 (*)    Creatinine, Ser 1.72 (*)    AST 79 (*)    GFR, Estimated 45 (*)    All other components within normal limits  LIPASE, BLOOD  URINALYSIS, ROUTINE W REFLEX MICROSCOPIC    EKG None  Radiology CT ABDOMEN PELVIS WO CONTRAST  Result Date: 12/10/2022 CLINICAL DATA:  Abdominal pain, constipation, on antibiotics for osteomyelitis EXAM: CT ABDOMEN AND PELVIS WITHOUT CONTRAST TECHNIQUE: Multidetector CT imaging of the abdomen and pelvis was performed following the standard protocol without IV contrast. RADIATION DOSE REDUCTION: This exam was performed according to the departmental dose-optimization program which includes automated exposure control, adjustment of the mA and/or kV according to patient size and/or use of iterative reconstruction technique. COMPARISON:  06/10/2022 FINDINGS: Lower chest: Linear scarring/atelectasis in the lingula. Hypodense blood pool relative to myocardium, suggesting anemia. Hepatobiliary: Unenhanced liver is unremarkable. Layering tiny gallstones (series 2/image 28), without associated inflammatory changes. No intrahepatic or extrahepatic duct dilatation. Pancreas: Within normal limits. Spleen: Calcified splenic granulomata. Adrenals/Urinary Tract: Adrenal glands are within normal limits. Kidneys are normal limits. No renal, ureteral, or bladder calculi. No hydronephrosis. Bladder is within normal limits. Stomach/Bowel: Stomach is within normal limits. No evidence of bowel obstruction. Appendix is not discretely visualized. Moderate colonic stool burden, most prominent in  the rectosigmoid colon, suggesting constipation. Vascular/Lymphatic: No evidence of abdominal aortic aneurysm. Atherosclerotic calcifications of the abdominal aorta and branch vessels. No suspicious abdominopelvic lymphadenopathy. Reproductive: Prostate is unremarkable. Other: No abdominopelvic ascites. Musculoskeletal: Moderate degenerative changes of the visualized thoracolumbar spine. Median sternotomy. IMPRESSION: Moderate  colonic stool burden, suggesting constipation. Cholelithiasis, without associated inflammatory changes. Additional ancillary findings as above. Electronically Signed   By: Julian Hy M.D.   On: 12/10/2022 00:39   DG Abdomen 1 View  Result Date: 12/10/2022 CLINICAL DATA:  Generalized abdominal pain for several days EXAM: ABDOMEN - 1 VIEW COMPARISON:  11/21/2022 FINDINGS: Scattered large and small bowel gas is noted. There is significant distension of the transverse colon identified. This may represent a colonic ileus although may be related to retained fecal material within the more distal colon. Correlate with physical exam. No free air is seen. IMPRESSION: Gaseous distension of the transverse colon likely related to significant retained fecal material within the left colon. Electronically Signed   By: Inez Catalina M.D.   On: 12/10/2022 00:03    Procedures Fecal disimpaction  Date/Time: 12/10/2022 2:04 AM  Performed by: Merryl Hacker, MD Authorized by: Merryl Hacker, MD  Consent: Verbal consent obtained. Written consent not obtained. Consent given by: patient Imaging studies: imaging studies available Patient identity confirmed: verbally with patient Time out: Immediately prior to procedure a "time out" was called to verify the correct patient, procedure, equipment, support staff and site/side marked as required. Local anesthesia used: no  Anesthesia: Local anesthesia used: no  Sedation: Patient sedated: no  Patient tolerance: patient tolerated the  procedure well with no immediate complications       Medications Ordered in ED Medications  sodium phosphate (FLEET) 7-19 GM/118ML enema 1 enema (1 enema Rectal Given 12/10/22 0117)    ED Course/ Medical Decision Making/ A&P                             Medical Decision Making Amount and/or Complexity of Data Reviewed Labs: ordered. Radiology: ordered.  Risk OTC drugs. Prescription drug management.   This patient presents to the ED for concern of constipation, abdominal pain, this involves an extensive number of treatment options, and is a complaint that carries with it a high risk of complications and morbidity.  I considered the following differential and admission for this acute, potentially life threatening condition.  The differential diagnosis includes constipation, SBO, sterile colitis, other intra-abdominal pathology such as cholecystitis, appendicitis  MDM:    This 62 year old male who presents with abdominal pain and reported constipation.  He is nontoxic and vital signs are reassuring.  He has minimal bowel sounds.  No distention.  He has evidence of stool impaction on exam.  He was disimpacted manually at the bedside.  This did not provoke a bowel movement.  Labs obtained.  He has a slight leukocytosis.  X-ray does not show any air-fluid levels but does show moderate stool burden.  Given leukocytosis, CT scan was obtained to rule out infection or stercoral colitis.  CT scan is largely reassuring but does show moderate stool burden specifically in the left colon.  I independently reviewed these images.  Enema was ordered.  Per nursing, small results noted following disimpaction and enema.  Patient was likely benefit from a formal cleanout.  Will discharge with GoLytely.  Recommend avoiding constipating medications and increasing fluids and fiber in diet.  (Labs, imaging, consults)  Labs: I Ordered, and personally interpreted labs.  The pertinent results include: CBC, BMP,  lipase  Imaging Studies ordered: I ordered imaging studies including x-ray, CT imaging I independently visualized and interpreted imaging. I agree with the radiologist interpretation  Additional history obtained from chart review.  External records  from outside source obtained and reviewed including prior evaluations  Cardiac Monitoring: The patient was maintained on a cardiac monitor.  I personally viewed and interpreted the cardiac monitored which showed an underlying rhythm of: Sinus rhythm  Reevaluation: After the interventions noted above, I reevaluated the patient and found that they have :improved  Social Determinants of Health:  from rehab facility  Disposition: Discharge  Co morbidities that complicate the patient evaluation  Past Medical History:  Diagnosis Date   Anemia    Arthritis    CAD (coronary artery disease)    a. s/p CABG in 03/2020 with LIMA-LAD, RIMA-PL, [id number]   Cancer ([place])    rectal   Cellulitis    COPD (chronic obstructive pulmonary disease) ([place])    Depression    Diabetes mellitus without complication ([place])    GERD (gastroesophageal reflux disease)    History of kidney stones    Hyperlipidemia 12/01/2019   Hypertension    Hypothyroidism    Ischemic cardiomyopathy    a. EF 20-25% by echo in 02/2020 b. at 40% by echo in 03/2020 c. EF normalized to 60-65% by echo in 04/2020   Myocardial infarction [facility])    Peripheral vascular disease ([place])    Sleep apnea    Type 2 diabetes mellitus ([place])      Medicines Meds ordered this encounter  Medications   sodium phosphate (FLEET) 7-19 GM/118ML enema 1 enema   polyethylene glycol (GOLYTELY) 236 g solution    Sig: Prepare and drink a glass (approx 8oz) every 10-15.    Dispense:  4000 mL    Refill:  0    I have reviewed the patients home medicines and have made adjustments as needed  Problem List / ED Course: Problem List Items Addressed This Visit       Other   Constipation - Primary                 Final Clinical Impression(s) / ED Diagnoses Final diagnoses:  Constipation, unspecified constipation type    Rx / DC Orders ED Discharge Orders          Ordered    polyethylene glycol (GOLYTELY) 236 g solution        12/10/22 0200              Konni Kesinger, Barbette Hair, MD 12/10/22 0206

## 2022-12-11 ENCOUNTER — Encounter (HOSPITAL_COMMUNITY): Payer: Self-pay | Admitting: Hematology

## 2022-12-15 ENCOUNTER — Ambulatory Visit (INDEPENDENT_AMBULATORY_CARE_PROVIDER_SITE_OTHER): Payer: Medicare Other | Admitting: Orthopedic Surgery

## 2022-12-15 ENCOUNTER — Encounter: Payer: Self-pay | Admitting: Orthopedic Surgery

## 2022-12-15 DIAGNOSIS — Z89511 Acquired absence of right leg below knee: Secondary | ICD-10-CM

## 2022-12-15 DIAGNOSIS — T8781 Dehiscence of amputation stump: Secondary | ICD-10-CM

## 2022-12-15 NOTE — Progress Notes (Signed)
Office Visit Note   Patient: Shane Chambers           Date of Birth: Nov 23, 1960           MRN: [id number] Visit Date: 12/15/2022              Requested by: Caprice Renshaw, [address] [place],  [place] [address] PCP: Caprice Renshaw, MD  Chief Complaint  Patient presents with   Right Leg - Routine Post Op    10/05/2022 right BKA       HPI: Patient is a 62 year old gentleman who presents in follow-up status post right below-knee amputation November 26.  Patient states he has been in and out of the hospital for the past several months.  Patient states he started having wound dehiscence in December.  Assessment & Plan: Visit Diagnoses:  1. Right below-knee amputee ([place])   2. Dehiscence of amputation stump of right lower extremity ([place])     Plan: With wound dehiscence and exposed tibia discussed recommendation to proceed with a revision of the right transtibial amputation.  Risk and benefits were discussed including risk of the wound not healing need for additional surgery.  Patient states he understands wished to proceed with surgery Wednesday.  Follow-Up Instructions: Return in about 2 weeks (around 12/29/2022).   Ortho Exam  Patient is alert, oriented, no adenopathy, well-dressed, normal affect, normal respiratory effort. Examination there is dehiscence of the right transtibial amputation.  There is healthy granulation tissue at the base there is exposed distal tibia there is no cellulitis no purulent drainage.  Imaging: No results found.    Labs: Lab Results  Component Value Date   HGBA1C 6.1 (H) 11/07/2022   HGBA1C 5.6 09/26/2022   HGBA1C 11.1 (H) 11/23/2020   ESRSEDRATE 115 (H) 11/21/2022   ESRSEDRATE 81 (H) 11/10/2022   ESRSEDRATE >140 (H) 10/02/2022   CRP 0.7 11/21/2022   CRP 4.7 (H) 11/10/2022   CRP 33.7 (H) 10/02/2022   REPTSTATUS 11/19/2022 FINAL 11/14/2022   GRAMSTAIN  11/14/2022    RARE WBC PRESENT, PREDOMINANTLY PMN NO ORGANISMS SEEN    CULT  11/14/2022     RARE METHICILLIN RESISTANT STAPHYLOCOCCUS AUREUS NO ANAEROBES ISOLATED Performed at [facility], [place] [address] [place], [place] [address]    LABORGA METHICILLIN RESISTANT STAPHYLOCOCCUS AUREUS 11/14/2022     Lab Results  Component Value Date   ALBUMIN 3.5 12/09/2022   ALBUMIN 2.9 (L) 11/11/2022   ALBUMIN 3.0 (L) 11/10/2022    Lab Results  Component Value Date   MG 2.4 11/27/2022   MG 2.2 11/26/2022   MG 2.4 11/25/2022   Lab Results  Component Value Date   VD25OH 28.25 (L) 11/11/2022   VD25OH 8.61 (L) 12/01/2019    No results found for: "PREALBUMIN"    Latest Ref Rng & Units 12/09/2022   11:37 PM 11/28/2022    5:12 AM 11/27/2022    5:06 AM  CBC EXTENDED  WBC 4.0 - 10.5 K/uL 14.0  7.9  8.6   RBC 4.22 - 5.81 MIL/uL 3.24  2.71  2.52   Hemoglobin 13.0 - 17.0 g/dL 9.5  7.9  7.5   HCT 39.0 - 52.0 % 30.2  25.1  23.3   Platelets 150 - 400 K/uL 393  310  291   NEUT# 1.7 - 7.7 K/uL 11.2     Lymph# 0.7 - 4.0 K/uL 1.1        There is no height or weight on file to  calculate BMI.  Orders:  No orders of the defined types were placed in this encounter.  No orders of the defined types were placed in this encounter.    Procedures: No procedures performed  Clinical Data: No additional findings.  ROS:  All other systems negative, except as noted in the HPI. Review of Systems  Objective: Vital Signs: There were no vitals taken for this visit.  Specialty Comments:  No specialty comments available.  PMFS History: Patient Active Problem List   Diagnosis Date Noted   MRSA infection 11/19/2022   Amputation stump infection ([place]) 11/19/2022   QT prolongation 11/10/2022   Severe recurrent major depression without psychotic features ([place]) 11/05/2022   Major depressive disorder with psychotic features ([place]) 11/02/2022   Acute hypoactive delirium due to another medical condition 10/13/2022   Septic arthritis of right ankle ([place]) 10/04/2022   Acute  osteomyelitis ([place]) 10/02/2022   Subacute osteomyelitis, right ankle and foot ([place]) 10/02/2022   Hypothyroidism 10/02/2022   Hypoalbuminemia 10/02/2022   Cellulitis of right lower extremity 09/25/2022   Lactic acidosis 04/23/2022   Anemia of chronic disease 04/23/2022   Obesity, Class III, BMI 40-49.9 (morbid obesity) ([place]) 04/23/2022   AKI (acute kidney injury) ([place]) 04/22/2022   Elevated troponin 04/22/2022   Cancer of sigmoid colon ([place]) 12/16/2021   Constipation 12/16/2021   GERD (gastroesophageal reflux disease) 12/16/2021   Iron deficiency anemia 09/17/2021   Rectal cancer ([place]) 09/03/2021   Bilateral lower leg cellulitis 16/08/9603   Systolic and diastolic CHF, chronic ([place]) 11/22/2020   CHF exacerbation ([place]) 08/01/2020   Visit for wound check 05/28/2020   OSA (obstructive sleep apnea) 05/24/2020   Acute exacerbation of chronic obstructive pulmonary disease (COPD) ([place]) 04/29/2020   Leukocytosis 04/29/2020   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   Ischemic cardiomyopathy    Coronary artery disease    Acute on chronic combined systolic and diastolic CHF (congestive heart failure) ([place]) 03/05/2020   Acute hyponatremia 03/05/2020   Severe Vitamin D deficiency 12/02/2019   Hypokalemia 12/01/2019   Hyperlipidemia 12/01/2019   BPH (benign prostatic hyperplasia) 12/01/2019   Normocytic anemia 12/01/2019   Pneumonia due to COVID-19 virus 12/01/2019   Hypoxia    SOB (shortness of breath) 11/16/2019   Severe sepsis ([place]) 11/16/2019   Hypertension    Type 2 diabetes mellitus ([place])    Depression    Acute bronchitis with bronchospasm 11/15/2019   Past Medical History:  Diagnosis Date   Anemia    Arthritis    CAD (coronary artery disease)    a. s/p CABG in 03/2020 with LIMA-LAD, RIMA-PL, RA-D1-RI-OM1   Cancer ([place])    rectal   Cellulitis    COPD (chronic obstructive pulmonary disease) ([place])    Depression    Diabetes mellitus without complication ([place])    GERD (gastroesophageal reflux  disease)    History of kidney stones    Hyperlipidemia 12/01/2019   Hypertension    Hypothyroidism    Ischemic cardiomyopathy    a. EF 20-25% by echo in 02/2020 b. at 40% by echo in 03/2020 c. EF normalized to 60-65% by echo in 04/2020   Myocardial infarction [facility])    Peripheral vascular disease ([place])    Sleep apnea    Type 2 diabetes mellitus ([place])     Family History  Problem Relation Age of Onset   Hypertension Mother     Past Surgical History:  Procedure Laterality Date   AMPUTATION Right 10/05/2022   Procedure: AMPUTATION BELOW KNEE;  Surgeon: Newt Minion, MD;  Location: [facility];  Service: Orthopedics;  Laterality: Right;   AMPUTATION Right 11/14/2022   Procedure: AMPUTATION BELOW KNEE REVISION; WASHOUT;  Surgeon: Katha Cabal, MD;  Location: [facility];  Service: Vascular;  Laterality: Right;   AMPUTATION Right 11/18/2022   Procedure: AMPUTATION BELOW KNEE REVISION AND CLOSURE;  Surgeon: Katha Cabal, MD;  Location: [facility];  Service: Vascular;  Laterality: Right;   APPLICATION OF WOUND VAC Right 10/05/2022   Procedure: APPLICATION OF WOUND VAC;  Surgeon: Newt Minion, MD;  Location: [facility];  Service: Orthopedics;  Laterality: Right;   BIOPSY  07/18/2021   Procedure: BIOPSY;  Surgeon: Eloise Harman, DO;  [facility];  Service: Endoscopy;;   BIOPSY  01/09/2022   Procedure: BIOPSY;  Surgeon: Eloise Harman, DO;  [facility];  Service: Endoscopy;;   COLONOSCOPY WITH PROPOFOL N/A 07/18/2021   Carver: 15 millimeter polyp removed from the sigmoid colon, 5 mm polyp removed from sigmoid colon.  Nonbleeding internal hemorrhoids.  Significant looping of the colon. sigmoid path showed invasive colonic adenocarcinoma involving tubular adenoma (invades to depth of 94m, carcinoma 16mfrom margin, no lymphovascular invasion, no poorly differentiated component.   CORONARY ARTERY BYPASS GRAFT N/A 03/13/2020   Procedure: CORONARY ARTERY BYPASS GRAFTING (CABG)  times five using bilateral Internal mammary arteries and left radial artery;  Surgeon: AtWonda OldsMD;  [facility] Service: Open Heart Surgery;  Laterality: N/A;   DENTAL SURGERY     ESOPHAGOGASTRODUODENOSCOPY (EGD) WITH PROPOFOL N/A 07/18/2021   Carver: 1 gastric polyp status post biopsy, gastritis. gastric bx with slight chronic inflammation and no H.pyori. GEJ polypectomy with mild inflammation only   FLEXIBLE SIGMOIDOSCOPY N/A 08/26/2021   Carver: Nonbleeding internal hemorrhoids.  15 mm ulcers from previous polypectomy found in the rectum.  No evidence of previous polyp.  Located 5 to 8 cm from anal verge.   FLEXIBLE SIGMOIDOSCOPY N/A 01/09/2022   Procedure: FLEXIBLE SIGMOIDOSCOPY;  Surgeon: CaEloise HarmanDO;  [facility];  Service: Endoscopy;  Laterality: N/A;   POLYPECTOMY  07/18/2021   Procedure: POLYPECTOMY INTESTINAL;  Surgeon: CaEloise HarmanDO;  [facility];  Service: Endoscopy;;   RADIAL ARTERY HARVEST Left 03/13/2020   Procedure: RAGilbert  Surgeon: AtWonda OldsMD;  Location: [facility] Service: Open Heart Surgery;  Laterality: Left;   RIGHT/LEFT HEART CATH AND CORONARY ANGIOGRAPHY N/A 03/07/2020   Procedure: RIGHT/LEFT HEART CATH AND CORONARY ANGIOGRAPHY;  Surgeon: CoSherren MochaMD;  [facility];  Service: Cardiovascular;  Laterality: N/A;   SUBMUCOSAL LIFTING INJECTION  01/09/2022   Procedure: SUBMUCOSAL LIFTING INJECTION;  Surgeon: CaEloise HarmanDO;  [facility];  Service: Endoscopy;;   SUBMUCOSAL TATTOO INJECTION  01/09/2022   Procedure: SUBMUCOSAL TATTOO INJECTION;  Surgeon: CaEloise HarmanDO;  [facility];  Service: Endoscopy;;   TEE WITHOUT CARDIOVERSION N/A 03/13/2020   Procedure: TRANSESOPHAGEAL ECHOCARDIOGRAM (TEE);  Surgeon: AtWonda OldsMD;  [facility]: [facility] Service: Open Heart Surgery;  Laterality: N/A;   Social History   Occupational History   Not  on file  Tobacco Use   Smoking status: Former    Packs/day: 1.50    Types: Cigarettes    Quit date: 05/25/1995    Years since quitting: 27.5   Smokeless tobacco: Never  Vaping Use   Vaping Use: Never used  Substance and  Sexual Activity   Alcohol use: Not Currently    Comment: rarely   Drug use: No   Sexual activity: Not on file

## 2022-12-16 ENCOUNTER — Other Ambulatory Visit: Payer: Self-pay

## 2022-12-16 NOTE — Progress Notes (Signed)
Anesthesia Chart Review: Same day workup  Follows with cardiology for history of CAD s/p history of MI and/P CABG 2021, HFpEF, mild aortic stenosis (mean gradient 12 mmHg by echo 10/2022), ICM with improvement of EF, HTN, HLD. EF reduced by Echo in April 2021 at 20-25%, underwent CABG in 03/2020 with RIMA-RL, LIMA-LAD, RA-D1-RI-OM1), EF 40% 03/2020 that normalized to 60-65% in 04/2020.  Patient has had multiple prolonged, complex recent admissions.  He was admitted November 2023 with cellulitis complicated by bacteremia and required left BKA which was further complicated by torsades de point thought secondary to multiple medications including amitriptyline, venlafaxine, metronidazole, Haldol, Zofran.  He had follow-up with EP cardiologist Dr. Caryl Comes on 12/01/2022.  Medications were reviewed.  He was recommended to continue beta-blocker and avoid QT prolonging drugs.  26-monthfollow-up recommended.  History of debility/deconditioning/functional quadriplegia..Marland Kitchen History of CKD 3.  IDDM 2, last A1c 6.1 on 11/07/2022.  Former smoker with associated COPD, maintained on Symbicort.  CMP and CBC from 12/09/2022 reviewed, creatinine 1.72 consistent with history of CKD, moderate chronic anemia with hemoglobin 9.5, mildly elevated WBC at 14.0.  Patient will need day of surgery evaluation.  EKG 11/10/2022: Normal sinus rhythm.  Rate 74. Possible Left atrial enlargement. Nonspecific ST abnormality  TTE 10/13/2022:  1. Left ventricular ejection fraction, by estimation, is 55 to 60%. The  left ventricle has normal function. The left ventricle has no regional  wall motion abnormalities. There is moderate concentric left ventricular  hypertrophy. Left ventricular  diastolic parameters are indeterminate.   2. Right ventricular systolic function is normal. The right ventricular  size is normal.   3. The mitral valve is normal in structure. Mild mitral valve  regurgitation.   4. AV is thickened, calcified. Difficult  to see well Peak and mean  gradients through the valve are 21 and 12 mm Hg respectively. AVA (VTI) is  1.8 cm2. Dimensionless index is 0.5 consistent with mild AS..Marland KitchenThe aortic  valve was not well visualized. Aortic valve regurgitation is not visualized.     JWynonia MustyMShands Lake Shore Regional Medical CenterShort Stay Center/Anesthesiology Phone [phone]/04/2023 10:41 AM

## 2022-12-16 NOTE — Anesthesia Preprocedure Evaluation (Addendum)
Anesthesia Evaluation  Patient identified by MRN, date of birth, ID band Patient awake    Reviewed: Allergy & Precautions, NPO status , Patient's Chart, lab work & pertinent test results  History of Anesthesia Complications Negative for: history of anesthetic complications  Airway Mallampati: III  TM Distance: >3 FB Neck ROM: Full    Dental  (+) Missing, Chipped, Poor Dentition, Loose   Pulmonary sleep apnea , COPD, former smoker   breath sounds clear to auscultation       Cardiovascular Exercise Tolerance: Poor hypertension, + CAD (s/p MI and CABG), + Past MI, + CABG, + Peripheral Vascular Disease and +CHF (ICM, EF 55-60%)  + Valvular Problems/Murmurs (mild AS) AS  Rhythm:Regular Rate:Normal + Systolic murmurs  ECG 4/0/34:  Normal sinus rhythm Possible Left atrial enlargement Nonspecific ST abnormality Abnormal ECG When compared with ECG of 15-Oct-2022 06:58, Nonspecific T wave abnormality, improved in Lateral leads  ECG 10/20/22: NSR, 76 bpm, nonspecific T wave abnormality, with prolonged QT with QTc interval at 497 ms, otherwise nothing acute.   Echo on 10/13/2022:  1. Left ventricular ejection fraction, by estimation, is 55 to 60%. The left ventricle has normal function. The left ventricle has no regional wall motion abnormalities. There is moderate concentric left ventricular hypertrophy. Left ventricular diastolic parameters are indeterminate.   2. Right ventricular systolic function is normal. The right ventricular size is normal.   3. The mitral valve is normal in structure. Mild mitral valve regurgitation.   4. AV is thickened, calcified. Difficult to see well Peak and mean gradients through the valve are 21 and 12 mm Hg respectively. AVA (VTI) is 1.8 cm2. Dimensionless index is 0.5 consistent with mild AS. The aortic valve was not well visualized. Aortic valve regurgitation is not visualized.   TEE on 03/13/20: - Left  Ventricle: The left ventricle has moderately reduced systolic  function, although is improving with time off of CPB. Overall ejection fraction of approximately 40%. The cavity size was mildly dilated. There is some recovery and improvement in contractility of the infero-septal and inferioir walls.  - Aortic Valve: The aortic valve appears unchanged from pre-bypass.  - Mitral Valve: The mitral valve appears unchanged from pre-bypass. Mild regurgitation persists.    Right and left heart cath on 03/07/20:  Prox RCA lesion is 99% stenosed.  Prox RCA to Mid RCA lesion is 40% stenosed.  Dist RCA lesion is 30% stenosed.  Prox LAD to Mid LAD lesion is 80% stenosed.  1st Diag lesion is 60% stenosed.  Mid LM to Dist LM lesion is 40% stenosed.  Prox Cx to Mid Cx lesion is 95% stenosed.   1. 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 Moderately elevated LV/right heart filling pressures c/w known diagnosis of heart failure 3. Known severe LV dysfunction by echo   Recommend: review revascularization options. Pt has surgical anatomy, but not sure he will be a candidate for CABG. High-risk multivessel PCI could be considered if he is not a candidate for surgery.    Neuro/Psych  PSYCHIATRIC DISORDERS  Depression    negative neurological ROS     GI/Hepatic ,GERD  ,,Rectal CA   Endo/Other  diabetes, Poorly Controlled, Type 2, Insulin DependentHypothyroidism  Obesity   Renal/GU Renal disease (nephrolithiasis; stage III CKD)     Musculoskeletal  (+) Arthritis ,    Abdominal   Peds  Hematology  (+)  Blood dyscrasia, anemia   Anesthesia Other Findings Cardiology note 10/20/22:  1. Torsades des points, Qtc prolongation Most recent hospital course complicated by QT prolongation, episode of Torsades, pt unresponsive but did not lose pulse. Asymptomatic surrounding  event. Pt denies any recent palpitations or tachycardia. EKG today reveals prolonged QT, however improved from hospitalization. Medication list reviewed. Not on any current QT prolonging drugs from a cardiac standpoint. Continue f/u with Psychiatry and PCP as mentioned below. Referred to EP at d/c and has upcoming appt with Dr. Caryl Comes . Will obtain CMP and Mag level. Continue current medication regimen. Heart healthy diet recommended and continue therapy at SNF.    2. CAD, s/p hx of MI and s/p CABG in 2021 Stable with no anginal symptoms. No indication for ischemic evaluation. Continue ASA, Lipitor, and Lopressor. Heart healthy diet recommended and continue therapy at SNF. Will obtain FLP as mentioned below.    3. Chronic diastolic CHF Most recent TTE revealed EF 55 to 60%, EF reduced in 2021, related to ischemic cardiomyopathy around CABG that improved to 60 to 65%. Does admit to chronic, intermittent SHOB. Will obtain pro-BNP. Appears overall euvolemic on exam. Low sodium diet, fluid restriction <2L, and daily weights encouraged. Educated to contact our office for weight gain of 2 lbs overnight or 5 lbs in one week.  Continue torsemide and current medication regimen.  Will obtain CMP and mag level as mentioned above.    4. Mild aortic stenosis Recent TTE revealed mild aortic stenosis without evidence of aortic regurgitation.  Patient is asymptomatic.  Continue current medication regimen.  Continue serial Echocardiograms, recommend to be repeated in 1 year or sooner if clinically indicated. Heart healthy diet recommended and continue therapy at SNF.   5. HLD No recent lipid panel on file. Will obtain FLP and CMP as mentioned above. Continue lipitor. Heart healthy diet recommended and continue therapy at SNF.    6. COPD Recent hospital course complicated by possible aspiration pneumonia during episode of torsades where he was unresponsive.  Clear/diminished breath sounds heard during auscultation.   Completed course of antibiotics.  Continue follow-up with PCP.    7. BKA Recent BKA of right lower extremity and has completed wound VAC. Denies any problems. Receiving therapy at SNF. Continue to follow with Orthopedics, PCP, and Psychiatry as mentioned above. Will obtain CBC in addition to lab work as mentioned above.    8. AKI on CKD stage 3 Peak sCr at 3.06 during hospitalization. Improvement in kidney function during hospitalization d/t IV fluids with most recent sCr at 1.17 with eGFR > 60. Will obtain repeat CMET as mentioned above.    9. Disposition: Will obtain the previously mentioned lab work at [facility] and will have lab results faxed over to [place].  Follow-up with Dr. Caryl Comes on 11/04/22 and follow-up with Dr. Carlyle Dolly in 3 months.    Reproductive/Obstetrics                             Anesthesia Physical Anesthesia Plan  ASA: 4  Anesthesia Plan:    Post-op Pain Management: Tylenol PO (pre-op)* and Regional block*   Induction: Intravenous  PONV Risk Score and Plan: 2 and Ondansetron, Dexamethasone, Treatment may vary due to age or medical condition, Midazolam and TIVA  Airway Management Planned: Natural Airway and Simple Face Mask  Additional Equipment:   Intra-op Plan:   Post-operative Plan:   Informed Consent: I have reviewed the patients  History and Physical, chart, labs and discussed the procedure including the risks, benefits and alternatives for the proposed anesthesia with the patient or authorized representative who has indicated his/her understanding and acceptance.     Dental advisory given  Plan Discussed with: CRNA  Anesthesia Plan Comments: (PAT note by Karoline Caldwell, PA-C: Follows with cardiology for history of CAD s/p history of MI and/P CABG 2021, HFpEF, mild aortic stenosis (mean gradient 12 mmHg by echo 10/2022), ICM with improvement of EF, HTN, HLD. EF reducedby Echo in April 2021 at 20-25%, underwent CABG in 03/2020 with  RIMA-RL, LIMA-LAD, RA-D1-RI-OM1), EF 40% 03/2020 that normalized to 60-65% in 04/2020.  Patient has had multiple prolonged, complex recent admissions.  He was admitted November 2023 with cellulitis complicated by bacteremia and required left BKA which was further complicated by torsades de point thought secondary to multiple medications including amitriptyline, venlafaxine, metronidazole, Haldol, Zofran.  He had follow-up with EP cardiologist Dr. Caryl Comes on 12/01/2022.  Medications were reviewed.  He was recommended to continue beta-blocker and avoid QT prolonging drugs.  5-monthfollow-up recommended.  History of debility/deconditioning/functional quadriplegia..Marland Kitchen History of CKD 3.  IDDM 2, last A1c 6.1 on 11/07/2022.  Former smoker with associated COPD, maintained on Symbicort.  CMP and CBC from 12/09/2022 reviewed, creatinine 1.72 consistent with history of CKD, moderate chronic anemia with hemoglobin 9.5, mildly elevated WBC at 14.0.  Patient will need day of surgery evaluation.  EKG 11/10/2022: Normal sinus rhythm.  Rate 74. Possible Left atrial enlargement. Nonspecific ST abnormality  TTE 10/13/2022: 1. Left ventricular ejection fraction, by estimation, is 55 to 60%. The  left ventricle has normal function. The left ventricle has no regional  wall motion abnormalities. There is moderate concentric left ventricular  hypertrophy. Left ventricular  diastolic parameters are indeterminate.  2. Right ventricular systolic function is normal. The right ventricular  size is normal.  3. The mitral valve is normal in structure. Mild mitral valve  regurgitation.  4. AV is thickened, calcified. Difficult to see well Peak and mean  gradients through the valve are 21 and 12 mm Hg respectively. AVA (VTI) is  1.8 cm2. Dimensionless index is 0.5 consistent with mild AS..Marland KitchenThe aortic  valve was not well visualized. Aortic valve regurgitation is not visualized.   )        Anesthesia Quick  Evaluation

## 2022-12-16 NOTE — Progress Notes (Addendum)
Patient is a resident in [place]. The facility was called with instructions for the surgery day, Per nurse Amber, patient doesn't have any signs/symptoms  of COVID, no acute distress, no complaints.  -------------  SDW INSTRUCTIONS given:  Your procedure is scheduled on Thursday, February 8th, 2024, at 09:32 o'clock.   Report to [facility] Main Entrance "A" at 07:00 A.M., and check in at the Admitting office.  Call this number if you have problems the morning of surgery:  [phone]   Remember:  Do not eat after midnight the night before your surgery  You may drink clear liquids until 06:30 the morning of your surgery.   Clear liquids allowed are: Water, Non-Citrus Juices (without pulp), Carbonated Beverages, Clear Tea, Black Coffee Only, and Gatorade    Take these medicines the morning of surgery with A SIP OF WATER:  Amlodipine, Abilify, Aspirin, Neurontin, Synthroid, Metoprolol, Prilosec, Flomax, Effexor, Cubicin, inhalers, eye drops.  PRN: Tylenol, Oxycodone.   As of today, STOP taking any Aspirin (unless otherwise instructed by your surgeon) Aleve, Naproxen, Ibuprofen, Motrin, Advil, Goody's, BC's, all herbal medications, fish oil, and all vitamins.   Do not take Metformin the morning of surgery.     THE MORNING OF SURGERY, hold Novolog  If your CBG is greater than 220 mg/dL, you may take  of your sliding scale (correction) dose of insulin.    How do I manage my blood sugar before surgery? Check your blood sugar at least 4 times a day, starting 2 days before surgery, to make sure that the level is not too high or low.  Check your blood sugar the morning of your surgery when you wake up and every 2 hours until you get to the Short Stay unit.  If your blood sugar is less than 70 mg/dL, you will need to treat for low blood sugar: Do not take insulin. Treat a low blood sugar (less than 70 mg/dL) with  cup of clear juice (cranberry  or apple), 4 glucose tablets, OR glucose gel. Recheck blood sugar in 15 minutes after treatment (to make sure it is greater than 70 mg/dL). If your blood sugar is not greater than 70 mg/dL on recheck, call [phone] for further instructions. Report your blood sugar to the short stay nurse when you get to Short Stay.   The day of surgery:                     Do not wear jewelry,             Do not wear lotions, powders, colognes, or deodorant.            Men may shave face and neck.            Do not bring valuables to the hospital.            [facility] is not responsible for any belongings or valuables.  Do NOT Smoke (Tobacco/Vaping) 24 hours prior to your procedure If you use a CPAP at night, you may bring all equipment for your overnight stay.   Contacts, glasses, dentures or bridgework may not be worn into surgery.      For patients admitted to the hospital, discharge time will be determined by your treatment team.   Patients discharged the day of surgery will not be allowed to drive home, and someone needs to stay with them for 24 hours.    Special instructions:   [facility]- Preparing For Surgery  Before surgery, you can play an important role. Because skin is not sterile, your skin needs to be as free of germs as possible. You can reduce the number of germs on your skin by washing with CHG (chlorahexidine gluconate) Soap before surgery.  CHG is an antiseptic cleaner which kills germs and bonds with the skin to continue killing germs even after washing.    Oral Hygiene is also important to reduce your risk of infection.  Remember - BRUSH YOUR TEETH THE MORNING OF SURGERY WITH YOUR REGULAR TOOTHPASTE  Please do not use if you have an allergy to CHG or antibacterial soaps. If your skin becomes reddened/irritated stop using the CHG.  Do not shave (including legs and underarms) for at least 48 hours prior to first CHG shower. It is OK to shave your face.  Please follow these  instructions carefully.   Shower the NIGHT BEFORE SURGERY and the MORNING OF SURGERY with DIAL Soap.   Pat yourself dry with a CLEAN TOWEL.  Wear CLEAN PAJAMAS to bed the night before surgery  Place CLEAN SHEETS on your bed the night of your first shower and DO NOT SLEEP WITH PETS.   Day of Surgery: Please shower morning of surgery  Wear Clean/Comfortable clothing the morning of surgery Do not apply any deodorants/lotions.   Remember to brush your teeth WITH YOUR REGULAR TOOTHPASTE.   Questions were answered. Patient verbalized understanding of instructions.

## 2022-12-17 ENCOUNTER — Encounter (HOSPITAL_COMMUNITY)
Admission: RE | Disposition: A | Discharge status: Skilled Nursing Facility | Payer: Self-pay | Source: Home / Self Care | Attending: Orthopedic Surgery

## 2022-12-17 ENCOUNTER — Other Ambulatory Visit: Payer: Self-pay

## 2022-12-17 ENCOUNTER — Inpatient Hospital Stay (HOSPITAL_COMMUNITY): Payer: Medicare Other | Admitting: Physician Assistant

## 2022-12-17 ENCOUNTER — Inpatient Hospital Stay (HOSPITAL_COMMUNITY)
Admission: RE | Admit: 2022-12-17 | Discharge: 2022-12-19 | DRG: 464 | Disposition: A | Discharge status: Skilled Nursing Facility | Payer: Medicare Other | Attending: Orthopedic Surgery | Admitting: Orthopedic Surgery

## 2022-12-17 ENCOUNTER — Encounter (HOSPITAL_COMMUNITY): Payer: Self-pay | Admitting: Orthopedic Surgery

## 2022-12-17 DIAGNOSIS — Z880 Allergy status to penicillin: Secondary | ICD-10-CM

## 2022-12-17 DIAGNOSIS — E1151 Type 2 diabetes mellitus with diabetic peripheral angiopathy without gangrene: Secondary | ICD-10-CM | POA: Diagnosis present

## 2022-12-17 DIAGNOSIS — T8781 Dehiscence of amputation stump: Secondary | ICD-10-CM

## 2022-12-17 DIAGNOSIS — Z7982 Long term (current) use of aspirin: Secondary | ICD-10-CM

## 2022-12-17 DIAGNOSIS — Z794 Long term (current) use of insulin: Secondary | ICD-10-CM

## 2022-12-17 DIAGNOSIS — K219 Gastro-esophageal reflux disease without esophagitis: Secondary | ICD-10-CM | POA: Diagnosis present

## 2022-12-17 DIAGNOSIS — Z7984 Long term (current) use of oral hypoglycemic drugs: Secondary | ICD-10-CM

## 2022-12-17 DIAGNOSIS — Z951 Presence of aortocoronary bypass graft: Secondary | ICD-10-CM | POA: Diagnosis not present

## 2022-12-17 DIAGNOSIS — D631 Anemia in chronic kidney disease: Secondary | ICD-10-CM | POA: Diagnosis present

## 2022-12-17 DIAGNOSIS — Y835 Amputation of limb(s) as the cause of abnormal reaction of the patient, or of later complication, without mention of misadventure at the time of the procedure: Secondary | ICD-10-CM | POA: Diagnosis present

## 2022-12-17 DIAGNOSIS — Z91013 Allergy to seafood: Secondary | ICD-10-CM | POA: Diagnosis not present

## 2022-12-17 DIAGNOSIS — Z89511 Acquired absence of right leg below knee: Secondary | ICD-10-CM | POA: Diagnosis not present

## 2022-12-17 DIAGNOSIS — T8743 Infection of amputation stump, right lower extremity: Secondary | ICD-10-CM

## 2022-12-17 DIAGNOSIS — E669 Obesity, unspecified: Secondary | ICD-10-CM | POA: Diagnosis present

## 2022-12-17 DIAGNOSIS — Z87891 Personal history of nicotine dependence: Secondary | ICD-10-CM | POA: Diagnosis not present

## 2022-12-17 DIAGNOSIS — Z79890 Hormone replacement therapy (postmenopausal): Secondary | ICD-10-CM

## 2022-12-17 DIAGNOSIS — Z79899 Other long term (current) drug therapy: Secondary | ICD-10-CM

## 2022-12-17 DIAGNOSIS — I350 Nonrheumatic aortic (valve) stenosis: Secondary | ICD-10-CM | POA: Diagnosis not present

## 2022-12-17 DIAGNOSIS — T8740 Infection of amputation stump, unspecified extremity: Principal | ICD-10-CM

## 2022-12-17 DIAGNOSIS — I10 Essential (primary) hypertension: Secondary | ICD-10-CM | POA: Diagnosis present

## 2022-12-17 DIAGNOSIS — M869 Osteomyelitis, unspecified: Secondary | ICD-10-CM | POA: Diagnosis present

## 2022-12-17 DIAGNOSIS — E785 Hyperlipidemia, unspecified: Secondary | ICD-10-CM | POA: Diagnosis present

## 2022-12-17 DIAGNOSIS — J449 Chronic obstructive pulmonary disease, unspecified: Secondary | ICD-10-CM | POA: Diagnosis present

## 2022-12-17 DIAGNOSIS — F32A Depression, unspecified: Secondary | ICD-10-CM | POA: Diagnosis present

## 2022-12-17 DIAGNOSIS — Z888 Allergy status to other drugs, medicaments and biological substances status: Secondary | ICD-10-CM | POA: Diagnosis not present

## 2022-12-17 DIAGNOSIS — Z6833 Body mass index (BMI) 33.0-33.9, adult: Secondary | ICD-10-CM | POA: Diagnosis not present

## 2022-12-17 DIAGNOSIS — I255 Ischemic cardiomyopathy: Secondary | ICD-10-CM | POA: Diagnosis present

## 2022-12-17 DIAGNOSIS — I2510 Atherosclerotic heart disease of native coronary artery without angina pectoris: Secondary | ICD-10-CM | POA: Diagnosis present

## 2022-12-17 DIAGNOSIS — I252 Old myocardial infarction: Secondary | ICD-10-CM | POA: Diagnosis not present

## 2022-12-17 DIAGNOSIS — E039 Hypothyroidism, unspecified: Secondary | ICD-10-CM | POA: Diagnosis present

## 2022-12-17 DIAGNOSIS — Z87442 Personal history of urinary calculi: Secondary | ICD-10-CM

## 2022-12-17 DIAGNOSIS — N183 Chronic kidney disease, stage 3 unspecified: Secondary | ICD-10-CM | POA: Diagnosis present

## 2022-12-17 DIAGNOSIS — Z8249 Family history of ischemic heart disease and other diseases of the circulatory system: Secondary | ICD-10-CM

## 2022-12-17 DIAGNOSIS — Z7951 Long term (current) use of inhaled steroids: Secondary | ICD-10-CM

## 2022-12-17 DIAGNOSIS — E1169 Type 2 diabetes mellitus with other specified complication: Secondary | ICD-10-CM | POA: Diagnosis present

## 2022-12-17 DIAGNOSIS — G4730 Sleep apnea, unspecified: Secondary | ICD-10-CM | POA: Diagnosis present

## 2022-12-17 HISTORY — PX: STUMP REVISION: SHX6102

## 2022-12-17 LAB — GLUCOSE, CAPILLARY
Glucose-Capillary: 157 mg/dL — ABNORMAL HIGH (ref 70–99)
Glucose-Capillary: 120 mg/dL — ABNORMAL HIGH (ref 70–99)
Glucose-Capillary: 123 mg/dL — ABNORMAL HIGH (ref 70–99)
Glucose-Capillary: 110 mg/dL — ABNORMAL HIGH (ref 70–99)
Glucose-Capillary: 114 mg/dL — ABNORMAL HIGH (ref 70–99)
Glucose-Capillary: 120 mg/dL — ABNORMAL HIGH (ref 70–99)

## 2022-12-17 SURGERY — REVISION, AMPUTATION SITE
Anesthesia: Monitor Anesthesia Care | Site: Leg Lower | Laterality: Right

## 2022-12-17 MED ORDER — LABETALOL HCL 5 MG/ML IV SOLN: 10.0000 mg | INTRAVENOUS | Status: DC | PRN

## 2022-12-17 MED ORDER — ZINC SULFATE 220 (50 ZN) MG PO CAPS
220.0000 mg | ORAL_CAPSULE | Freq: Every day | ORAL | Status: DC
  Administered 2022-12-17 – 2022-12-19 (×3): 220 mg via ORAL
  Filled 2022-12-17 (×3): qty 1

## 2022-12-17 MED ORDER — MAGNESIUM OXIDE -MG SUPPLEMENT 400 (240 MG) MG PO TABS
400.0000 mg | ORAL_TABLET | Freq: Every day | ORAL | Status: DC
  Administered 2022-12-17 – 2022-12-19 (×3): 400 mg via ORAL
  Filled 2022-12-17 (×3): qty 1

## 2022-12-17 MED ORDER — POTASSIUM CHLORIDE CRYS ER 20 MEQ PO TBCR
20.0000 meq | EXTENDED_RELEASE_TABLET | Freq: Every day | ORAL | Status: DC
  Administered 2022-12-17 – 2022-12-19 (×3): 20 meq via ORAL
  Filled 2022-12-17 (×3): qty 1

## 2022-12-17 MED ORDER — FENTANYL CITRATE (PF) 250 MCG/5ML IJ SOLN
INTRAMUSCULAR | Status: AC
  Filled 2022-12-17: qty 5

## 2022-12-17 MED ORDER — TORSEMIDE 20 MG PO TABS
40.0000 mg | ORAL_TABLET | Freq: Every day | ORAL | Status: DC
  Administered 2022-12-17 – 2022-12-19 (×3): 40 mg via ORAL
  Filled 2022-12-17 (×3): qty 2

## 2022-12-17 MED ORDER — MAGNESIUM SULFATE 2 GM/50ML IV SOLN: 2.0000 g | Freq: Every day | INTRAVENOUS | Status: DC | PRN

## 2022-12-17 MED ORDER — TRANEXAMIC ACID-NACL 1000-0.7 MG/100ML-% IV SOLN
INTRAVENOUS | Status: AC
  Filled 2022-12-17: qty 100

## 2022-12-17 MED ORDER — LURASIDONE HCL 20 MG PO TABS
20.0000 mg | ORAL_TABLET | Freq: Every evening | ORAL | Status: DC
  Administered 2022-12-17 – 2022-12-18 (×2): 20 mg via ORAL
  Filled 2022-12-17 (×2): qty 1

## 2022-12-17 MED ORDER — INSULIN ASPART 100 UNIT/ML IJ SOLN
0.0000 [IU] | Freq: Three times a day (TID) | INTRAMUSCULAR | Status: DC
  Administered 2022-12-18: 2 [IU] via SUBCUTANEOUS

## 2022-12-17 MED ORDER — PANTOPRAZOLE SODIUM 40 MG PO TBEC: 40.0000 mg | DELAYED_RELEASE_TABLET | Freq: Every day | ORAL | Status: DC

## 2022-12-17 MED ORDER — OXYCODONE HCL 5 MG PO TABS
ORAL_TABLET | ORAL | Status: AC
  Filled 2022-12-17: qty 1

## 2022-12-17 MED ORDER — METOPROLOL TARTRATE 5 MG/5ML IV SOLN: 2.0000 mg | INTRAVENOUS | Status: DC | PRN

## 2022-12-17 MED ORDER — FENTANYL CITRATE (PF) 100 MCG/2ML IJ SOLN: 25.0000 ug | INTRAMUSCULAR | Status: DC | PRN

## 2022-12-17 MED ORDER — VENLAFAXINE HCL ER 150 MG PO CP24
150.0000 mg | ORAL_CAPSULE | Freq: Every day | ORAL | Status: DC
  Administered 2022-12-18 – 2022-12-19 (×2): 150 mg via ORAL
  Filled 2022-12-17 (×2): qty 1

## 2022-12-17 MED ORDER — ASPIRIN 81 MG PO TBEC
81.0000 mg | DELAYED_RELEASE_TABLET | Freq: Every day | ORAL | Status: DC
  Administered 2022-12-18 – 2022-12-19 (×2): 81 mg via ORAL
  Filled 2022-12-17 (×2): qty 1

## 2022-12-17 MED ORDER — METFORMIN HCL 500 MG PO TABS
1000.0000 mg | ORAL_TABLET | Freq: Two times a day (BID) | ORAL | Status: DC
  Administered 2022-12-17 – 2022-12-19 (×4): 1000 mg via ORAL
  Filled 2022-12-17 (×4): qty 2

## 2022-12-17 MED ORDER — AMLODIPINE BESYLATE 10 MG PO TABS
10.0000 mg | ORAL_TABLET | Freq: Every day | ORAL | Status: DC
  Administered 2022-12-17 – 2022-12-19 (×3): 10 mg via ORAL
  Filled 2022-12-17 (×3): qty 1

## 2022-12-17 MED ORDER — GUAIFENESIN-DM 100-10 MG/5ML PO SYRP: 15.0000 mL | ORAL_SOLUTION | ORAL | Status: DC | PRN

## 2022-12-17 MED ORDER — METOPROLOL TARTRATE 50 MG PO TABS
50.0000 mg | ORAL_TABLET | Freq: Two times a day (BID) | ORAL | Status: DC
  Administered 2022-12-17 – 2022-12-19 (×4): 50 mg via ORAL
  Filled 2022-12-17 (×4): qty 1

## 2022-12-17 MED ORDER — TRANEXAMIC ACID-NACL 1000-0.7 MG/100ML-% IV SOLN
1000.0000 mg | INTRAVENOUS | Status: AC
  Administered 2022-12-17: 1000 mg via INTRAVENOUS

## 2022-12-17 MED ORDER — LACTATED RINGERS IV SOLN: INTRAVENOUS | Status: DC

## 2022-12-17 MED ORDER — IPRATROPIUM-ALBUTEROL 0.5-2.5 (3) MG/3ML IN SOLN: 3.0000 mL | Freq: Four times a day (QID) | RESPIRATORY_TRACT | Status: DC | PRN

## 2022-12-17 MED ORDER — PANTOPRAZOLE SODIUM 40 MG PO TBEC
40.0000 mg | DELAYED_RELEASE_TABLET | Freq: Every day | ORAL | Status: DC
  Administered 2022-12-17 – 2022-12-19 (×3): 40 mg via ORAL
  Filled 2022-12-17 (×3): qty 1

## 2022-12-17 MED ORDER — PROPOFOL 10 MG/ML IV BOLUS
INTRAVENOUS | Status: AC
  Filled 2022-12-17: qty 20

## 2022-12-17 MED ORDER — FERROUS GLUCONATE 324 (38 FE) MG PO TABS
324.0000 mg | ORAL_TABLET | Freq: Every day | ORAL | Status: DC
  Administered 2022-12-17 – 2022-12-19 (×3): 324 mg via ORAL
  Filled 2022-12-17 (×3): qty 1

## 2022-12-17 MED ORDER — ONDANSETRON HCL 4 MG/2ML IJ SOLN: 4.0000 mg | Freq: Four times a day (QID) | INTRAMUSCULAR | Status: DC | PRN

## 2022-12-17 MED ORDER — CEFAZOLIN SODIUM-DEXTROSE 2-4 GM/100ML-% IV SOLN
2.0000 g | Freq: Three times a day (TID) | INTRAVENOUS | Status: AC
  Administered 2022-12-18: 2 g via INTRAVENOUS
  Filled 2022-12-17 (×2): qty 100

## 2022-12-17 MED ORDER — ACETAMINOPHEN 500 MG PO TABS: 1000.0000 mg | ORAL_TABLET | Freq: Once | ORAL | Status: DC

## 2022-12-17 MED ORDER — CEFAZOLIN SODIUM-DEXTROSE 2-4 GM/100ML-% IV SOLN
2.0000 g | INTRAVENOUS | Status: AC
  Administered 2022-12-17: 2 g via INTRAVENOUS

## 2022-12-17 MED ORDER — GABAPENTIN 300 MG PO CAPS
600.0000 mg | ORAL_CAPSULE | Freq: Two times a day (BID) | ORAL | Status: DC
  Administered 2022-12-17 – 2022-12-19 (×5): 600 mg via ORAL
  Filled 2022-12-17 (×5): qty 2

## 2022-12-17 MED ORDER — POTASSIUM CHLORIDE CRYS ER 20 MEQ PO TBCR: 20.0000 meq | EXTENDED_RELEASE_TABLET | Freq: Every day | ORAL | Status: DC | PRN

## 2022-12-17 MED ORDER — SODIUM CHLORIDE 0.9% FLUSH
10.0000 mL | Freq: Two times a day (BID) | INTRAVENOUS | Status: DC
  Administered 2022-12-17 – 2022-12-18 (×4): 10 mL

## 2022-12-17 MED ORDER — CHLORHEXIDINE GLUCONATE 0.12 % MT SOLN
OROMUCOSAL | Status: AC
  Administered 2022-12-17: 15 mL via OROMUCOSAL
  Filled 2022-12-17: qty 15

## 2022-12-17 MED ORDER — CHLORHEXIDINE GLUCONATE CLOTH 2 % EX PADS
6.0000 | MEDICATED_PAD | Freq: Every day | CUTANEOUS | Status: DC
  Administered 2022-12-17 – 2022-12-19 (×3): 6 via TOPICAL

## 2022-12-17 MED ORDER — DAPTOMYCIN IV (FOR PTA / DISCHARGE USE ONLY): 700.0000 mg | INTRAVENOUS | Status: DC

## 2022-12-17 MED ORDER — LEVOTHYROXINE SODIUM 75 MCG PO TABS
75.0000 ug | ORAL_TABLET | Freq: Every day | ORAL | Status: DC
  Administered 2022-12-18 – 2022-12-19 (×2): 75 ug via ORAL
  Filled 2022-12-17 (×2): qty 1

## 2022-12-17 MED ORDER — SODIUM CHLORIDE 0.9 % IV SOLN
700.0000 mg | Freq: Every day | INTRAVENOUS | Status: DC
  Administered 2022-12-17 – 2022-12-18 (×2): 700 mg via INTRAVENOUS
  Filled 2022-12-17 (×3): qty 14

## 2022-12-17 MED ORDER — ACETAMINOPHEN 325 MG PO TABS: 325.0000 mg | ORAL_TABLET | Freq: Four times a day (QID) | ORAL | Status: DC | PRN

## 2022-12-17 MED ORDER — CEFAZOLIN SODIUM-DEXTROSE 2-4 GM/100ML-% IV SOLN
INTRAVENOUS | Status: AC
  Administered 2022-12-17: 2 g via INTRAVENOUS
  Filled 2022-12-17: qty 100

## 2022-12-17 MED ORDER — ALUM & MAG HYDROXIDE-SIMETH 200-200-20 MG/5ML PO SUSP: 15.0000 mL | ORAL | Status: DC | PRN

## 2022-12-17 MED ORDER — DOCUSATE SODIUM 100 MG PO CAPS
100.0000 mg | ORAL_CAPSULE | Freq: Every day | ORAL | Status: DC
  Administered 2022-12-18 – 2022-12-19 (×2): 100 mg via ORAL
  Filled 2022-12-17 (×2): qty 1

## 2022-12-17 MED ORDER — POLYETHYLENE GLYCOL 3350 17 G PO PACK: 17.0000 g | PACK | Freq: Every day | ORAL | Status: DC | PRN

## 2022-12-17 MED ORDER — ATORVASTATIN CALCIUM 40 MG PO TABS
40.0000 mg | ORAL_TABLET | Freq: Every day | ORAL | Status: DC
  Administered 2022-12-17 – 2022-12-18 (×2): 40 mg via ORAL
  Filled 2022-12-17 (×2): qty 1

## 2022-12-17 MED ORDER — INSULIN ASPART 100 UNIT/ML IJ SOLN
4.0000 [IU] | Freq: Three times a day (TID) | INTRAMUSCULAR | Status: DC
  Administered 2022-12-19: 4 [IU] via SUBCUTANEOUS

## 2022-12-17 MED ORDER — PROPOFOL 10 MG/ML IV BOLUS
INTRAVENOUS | Status: DC | PRN
  Administered 2022-12-17: 20 mg via INTRAVENOUS
  Administered 2022-12-17 (×8): 10 mg via INTRAVENOUS
  Administered 2022-12-17 (×3): 20 mg via INTRAVENOUS

## 2022-12-17 MED ORDER — INSULIN ASPART 100 UNIT/ML IJ SOLN
0.0000 [IU] | INTRAMUSCULAR | Status: DC | PRN
  Administered 2022-12-17: 2 [IU] via SUBCUTANEOUS
  Filled 2022-12-17: qty 1

## 2022-12-17 MED ORDER — FENTANYL CITRATE (PF) 100 MCG/2ML IJ SOLN
INTRAMUSCULAR | Status: DC | PRN
  Administered 2022-12-17 (×2): 25 ug via INTRAVENOUS

## 2022-12-17 MED ORDER — HYDRALAZINE HCL 20 MG/ML IJ SOLN: 5.0000 mg | INTRAMUSCULAR | Status: DC | PRN

## 2022-12-17 MED ORDER — SODIUM CHLORIDE 0.9 % IV SOLN: INTRAVENOUS | Status: DC

## 2022-12-17 MED ORDER — HYDROMORPHONE HCL 1 MG/ML IJ SOLN
0.5000 mg | INTRAMUSCULAR | Status: DC | PRN
  Administered 2022-12-18 – 2022-12-19 (×3): 1 mg via INTRAVENOUS
  Filled 2022-12-17 (×3): qty 1

## 2022-12-17 MED ORDER — MAGNESIUM CITRATE PO SOLN: 1.0000 | Freq: Once | ORAL | Status: DC | PRN

## 2022-12-17 MED ORDER — ORAL CARE MOUTH RINSE: 15.0000 mL | Freq: Once | OROMUCOSAL | Status: AC

## 2022-12-17 MED ORDER — OXYCODONE HCL 5 MG PO TABS
10.0000 mg | ORAL_TABLET | ORAL | Status: DC | PRN
  Administered 2022-12-17 – 2022-12-18 (×2): 10 mg via ORAL
  Administered 2022-12-18 – 2022-12-19 (×2): 15 mg via ORAL
  Filled 2022-12-17 (×2): qty 3

## 2022-12-17 MED ORDER — OXYCODONE HCL 5 MG PO TABS
5.0000 mg | ORAL_TABLET | ORAL | Status: DC | PRN
  Administered 2022-12-17 – 2022-12-18 (×2): 5 mg via ORAL
  Filled 2022-12-17 (×3): qty 2

## 2022-12-17 MED ORDER — METOPROLOL TARTRATE 50 MG PO TABS: 50.0000 mg | ORAL_TABLET | Freq: Once | ORAL | Status: AC

## 2022-12-17 MED ORDER — SODIUM CHLORIDE 0.9% FLUSH: 10.0000 mL | INTRAVENOUS | Status: DC | PRN

## 2022-12-17 MED ORDER — 0.9 % SODIUM CHLORIDE (POUR BTL) OPTIME
TOPICAL | Status: DC | PRN
  Administered 2022-12-17: 1000 mL

## 2022-12-17 MED ORDER — PHENOL 1.4 % MT LIQD: 1.0000 | OROMUCOSAL | Status: DC | PRN

## 2022-12-17 MED ORDER — ALBUTEROL SULFATE (2.5 MG/3ML) 0.083% IN NEBU: 2.5000 mg | INHALATION_SOLUTION | Freq: Four times a day (QID) | RESPIRATORY_TRACT | Status: DC | PRN

## 2022-12-17 MED ORDER — CHLORHEXIDINE GLUCONATE 0.12 % MT SOLN: 15.0000 mL | Freq: Once | OROMUCOSAL | Status: AC

## 2022-12-17 MED ORDER — TAMSULOSIN HCL 0.4 MG PO CAPS
0.8000 mg | ORAL_CAPSULE | Freq: Every evening | ORAL | Status: DC
  Administered 2022-12-17 – 2022-12-18 (×2): 0.8 mg via ORAL
  Filled 2022-12-17 (×3): qty 2

## 2022-12-17 MED ORDER — JUVEN PO PACK
1.0000 | PACK | Freq: Two times a day (BID) | ORAL | Status: DC
  Administered 2022-12-17 – 2022-12-19 (×4): 1 via ORAL
  Filled 2022-12-17 (×4): qty 1

## 2022-12-17 MED ORDER — MUPIROCIN 2 % EX OINT
TOPICAL_OINTMENT | CUTANEOUS | Status: AC
  Filled 2022-12-17: qty 22

## 2022-12-17 MED ORDER — VITAMIN C 500 MG PO TABS
1000.0000 mg | ORAL_TABLET | Freq: Every day | ORAL | Status: DC
  Administered 2022-12-17 – 2022-12-19 (×3): 1000 mg via ORAL
  Filled 2022-12-17 (×3): qty 2

## 2022-12-17 MED ORDER — BISACODYL 5 MG PO TBEC: 5.0000 mg | DELAYED_RELEASE_TABLET | Freq: Every day | ORAL | Status: DC | PRN

## 2022-12-17 MED ORDER — MOMETASONE FURO-FORMOTEROL FUM 200-5 MCG/ACT IN AERO
2.0000 | INHALATION_SPRAY | Freq: Two times a day (BID) | RESPIRATORY_TRACT | Status: DC
  Administered 2022-12-17 – 2022-12-19 (×4): 2 via RESPIRATORY_TRACT
  Filled 2022-12-17: qty 8.8

## 2022-12-17 MED ORDER — VITAMIN D 25 MCG (1000 UNIT) PO TABS: 5000.0000 [IU] | ORAL_TABLET | ORAL | Status: DC

## 2022-12-17 SURGICAL SUPPLY — 33 items
BAG COUNTER SPONGE SURGICOUNT (BAG) ×2 IMPLANT
BAG SPNG CNTER NS LX DISP (BAG) ×1
BLADE SAW RECIP 87.9 MT (BLADE) IMPLANT
BLADE SURG 21 STRL SS (BLADE) ×2 IMPLANT
CANISTER WOUND CARE 500ML ATS (WOUND CARE) ×2 IMPLANT
COVER SURGICAL LIGHT HANDLE (MISCELLANEOUS) ×2 IMPLANT
DRAPE DERMATAC (DRAPES) IMPLANT
DRAPE EXTREMITY T 121X128X90 (DISPOSABLE) ×2 IMPLANT
DRAPE HALF SHEET 40X57 (DRAPES) ×2 IMPLANT
DRAPE INCISE IOBAN 66X45 STRL (DRAPES) ×2 IMPLANT
DRAPE U-SHAPE 47X51 STRL (DRAPES) ×4 IMPLANT
DRESSING PREVENA PLUS CUSTOM (GAUZE/BANDAGES/DRESSINGS) ×2 IMPLANT
DRSG PREVENA PLUS CUSTOM (GAUZE/BANDAGES/DRESSINGS) ×1
DURAPREP 26ML APPLICATOR (WOUND CARE) ×2 IMPLANT
ELECT REM PT RETURN 9FT ADLT (ELECTROSURGICAL) ×1
ELECTRODE REM PT RTRN 9FT ADLT (ELECTROSURGICAL) ×2 IMPLANT
GLOVE BIOGEL PI IND STRL 9 (GLOVE) ×2 IMPLANT
GLOVE SURG ORTHO 9.0 STRL STRW (GLOVE) ×2 IMPLANT
GOWN STRL REUS W/ TWL XL LVL3 (GOWN DISPOSABLE) ×4 IMPLANT
GOWN STRL REUS W/TWL XL LVL3 (GOWN DISPOSABLE) ×2
GRAFT SKIN WND MICRO 38 (Tissue) IMPLANT
KIT BASIN OR (CUSTOM PROCEDURE TRAY) ×2 IMPLANT
KIT TURNOVER KIT B (KITS) ×2 IMPLANT
MANIFOLD NEPTUNE II (INSTRUMENTS) ×2 IMPLANT
NS IRRIG 1000ML POUR BTL (IV SOLUTION) ×2 IMPLANT
PACK GENERAL/GYN (CUSTOM PROCEDURE TRAY) ×2 IMPLANT
PAD ARMBOARD 7.5X6 YLW CONV (MISCELLANEOUS) ×2 IMPLANT
PREVENA RESTOR ARTHOFORM 46X30 (CANNISTER) ×2 IMPLANT
STAPLER VISISTAT 35W (STAPLE) IMPLANT
SUT ETHILON 2 0 PSLX (SUTURE) ×4 IMPLANT
SUT SILK 2 0 (SUTURE)
SUT SILK 2-0 18XBRD TIE 12 (SUTURE) IMPLANT
TOWEL GREEN STERILE (TOWEL DISPOSABLE) ×2 IMPLANT

## 2022-12-17 NOTE — Op Note (Signed)
12/17/2022  10:30 AM  PATIENT:  Shane Chambers    PRE-OPERATIVE DIAGNOSIS:  Dehiscence Right Below Knee Amputation  POST-OPERATIVE DIAGNOSIS:  Same  PROCEDURE:  RIGHT BELOW KNEE AMPUTATION REVISION Application Kerecis micro graft 38 cm. Application of Prevena customizable and Arnell Sieving form wound VAC.  SURGEON:  Newt Minion, MD  PHYSICIAN ASSISTANT:None ANESTHESIA:   General  PREOPERATIVE INDICATIONS:  [name] is a  62 y.o. male with a diagnosis of Dehiscence Right Below Knee Amputation who failed conservative measures and elected for surgical management.    The risks benefits and alternatives were discussed with the patient preoperatively including but not limited to the risks of infection, bleeding, nerve injury, cardiopulmonary complications, the need for revision surgery, among others, and the patient was willing to proceed.  OPERATIVE IMPLANTS: Kerecis micro graft 38 cm.  '@ENCIMAGES'$ @  OPERATIVE FINDINGS: Patient had petechial bleeding no abscess at the margins.  The margins were clear.  OPERATIVE PROCEDURE: Patient was brought the operating room after undergoing a regional anesthetic.  After adequate levels anesthesia were obtained patient's right lower extremity was prepped using DuraPrep draped into a sterile field a timeout was called.  A fishmouth incision was made around the dehiscence of the transtibial amputation this was carried down to bone.  A reciprocating saw was used to resect the distal 2 cm of tibia and fibula.  The anterior tibia was beveled.  This was resected in 1 block of tissue the tissue margins were clear.  Electrocautery was used for hemostasis the wound was irrigated with normal saline.  The wound was filled with 38 cm of Kerecis micro graft.  This covered a wound surface area of 200 cm.  The deep and superficial fascial layers and skin was closed using 2-0 nylon.  The Praveena customizable and Arnell Sieving form wound VAC was applied this had a good  suction fit this was covered with a stump shrinker and a limb protector.  Patient was taken the PACU in stable condition.   DISCHARGE PLANNING:  Antibiotic duration: 24 hours  Weightbearing: Nonweightbearing on the right  Pain medication: Opioid pathway  Dressing care/ Wound VAC: Continue wound VAC for 1 week  Ambulatory devices: Walker  Discharge to: Discharge planning based on therapy recommendations.  Follow-up: In the office 1 week post operative.

## 2022-12-17 NOTE — Transfer of Care (Signed)
Immediate Anesthesia Transfer of Care Note  Patient: Shane Chambers  Procedure(s) Performed: RIGHT BELOW KNEE AMPUTATION REVISION (Right: Leg Lower)  Patient Location: PACU  Anesthesia Type:MAC combined with regional for post-op pain  Level of Consciousness: awake, alert , and oriented  Airway & Oxygen Therapy: Patient Spontanous Breathing  Post-op Assessment: Report given to RN and Post -op Vital signs reviewed and stable  Post vital signs: Reviewed and stable  Last Vitals:  Vitals Value Taken Time  BP 148/79 12/17/22 1026  Temp    Pulse 85 12/17/22 1029  Resp 20 12/17/22 1029  SpO2 95 % 12/17/22 1029  Vitals shown include unvalidated device data.  Last Pain:  Vitals:   12/17/22 0705  TempSrc: Oral         Complications: No notable events documented.

## 2022-12-17 NOTE — H&P (Signed)
Shane Chambers is an 62 y.o. male.   Chief Complaint: Dehiscence right below-knee amputation. HPI: Patient is a 62 year old gentleman who presents in follow-up status post right below-knee amputation November 26. Patient states he has been in and out of the hospital for the past several months. Patient states he started having wound dehiscence in December.   Past Medical History:  Diagnosis Date   Anemia    Arthritis    CAD (coronary artery disease)    a. s/p CABG in 03/2020 with LIMA-LAD, RIMA-PL, RA-[id number]-OM1   Cancer ([place])    rectal   Cellulitis    COPD (chronic obstructive pulmonary disease) ([place])    Depression    Diabetes mellitus without complication (HCC)    GERD (gastroesophageal reflux disease)    History of kidney stones    Hyperlipidemia 12/01/2019   Hypertension    Hypothyroidism    Ischemic cardiomyopathy    a. EF 20-25% by echo in 02/2020 b. at 40% by echo in 03/2020 c. EF normalized to 60-65% by echo in 04/2020   Myocardial infarction [facility])    Peripheral vascular disease ([place])    Sleep apnea    Type 2 diabetes mellitus ([place])     Past Surgical History:  Procedure Laterality Date   AMPUTATION Right 10/05/2022   Procedure: AMPUTATION BELOW KNEE;  Surgeon: Newt Minion, MD;  Location: [place];  Service: Orthopedics;  Laterality: Right;   AMPUTATION Right 11/14/2022   Procedure: AMPUTATION BELOW KNEE REVISION; WASHOUT;  Surgeon: Katha Cabal, MD;  Location: [facility];  Service: Vascular;  Laterality: Right;   AMPUTATION Right 11/18/2022   Procedure: AMPUTATION BELOW KNEE REVISION AND CLOSURE;  Surgeon: Katha Cabal, MD;  Location: [facility];  Service: Vascular;  Laterality: Right;   APPLICATION OF WOUND VAC Right 10/05/2022   Procedure: APPLICATION OF WOUND VAC;  Surgeon: Newt Minion, MD;  Location: [place];  Service: Orthopedics;  Laterality: Right;   BIOPSY  07/18/2021   Procedure: BIOPSY;  Surgeon: Eloise Harman, DO;  [facility];  Service:  Endoscopy;;   BIOPSY  01/09/2022   Procedure: BIOPSY;  Surgeon: Eloise Harman, DO;  [facility];  Service: Endoscopy;;   COLONOSCOPY WITH PROPOFOL N/A 07/18/2021   [place]: 15 millimeter polyp removed from the sigmoid colon, 5 mm polyp removed from sigmoid colon.  Nonbleeding internal hemorrhoids.  Significant looping of the colon. sigmoid path showed invasive colonic adenocarcinoma involving tubular adenoma (invades to depth of 7m, carcinoma 12mfrom margin, no lymphovascular invasion, no poorly differentiated component.   CORONARY ARTERY BYPASS GRAFT N/A 03/13/2020   Procedure: CORONARY ARTERY BYPASS GRAFTING (CABG) times five using bilateral Internal mammary arteries and left radial artery;  Surgeon: AtWonda OldsMD;  [place] Service: Open Heart Surgery;  Laterality: N/A;   DENTAL SURGERY     ESOPHAGOGASTRODUODENOSCOPY (EGD) WITH PROPOFOL N/A 07/18/2021   Carver: 1 gastric polyp status post biopsy, gastritis. gastric bx with slight chronic inflammation and no H.pyori. GEJ polypectomy with mild inflammation only   FLEXIBLE SIGMOIDOSCOPY N/A 08/26/2021   [place]: Nonbleeding internal hemorrhoids.  15 mm ulcers from previous polypectomy found in the rectum.  No evidence of previous polyp.  Located 5 to 8 cm from anal verge.   FLEXIBLE SIGMOIDOSCOPY N/A 01/09/2022   Procedure: FLEXIBLE SIGMOIDOSCOPY;  Surgeon: CaEloise HarmanDO;  [facility];  Service: Endoscopy;  Laterality: N/A;   POLYPECTOMY  07/18/2021   Procedure: POLYPECTOMY  INTESTINAL;  Surgeon: Eloise Harman, DO;  [facility];  Service: Endoscopy;;   RADIAL ARTERY HARVEST Left 03/13/2020   Procedure: [place],;  Surgeon: [name], MD;  Location: [place];  Service: Open Heart Surgery;  Laterality: Left;   RIGHT/LEFT HEART CATH AND CORONARY ANGIOGRAPHY N/A 03/07/2020   Procedure: RIGHT/LEFT HEART CATH AND CORONARY ANGIOGRAPHY;  Surgeon: Sherren Mocha, MD;   Location: [place] [facility];  Service: Cardiovascular;  Laterality: N/A;   SUBMUCOSAL LIFTING INJECTION  01/09/2022   Procedure: SUBMUCOSAL LIFTING INJECTION;  Surgeon: Eloise Harman, DO;  [facility];  Service: Endoscopy;;   SUBMUCOSAL TATTOO INJECTION  01/09/2022   Procedure: SUBMUCOSAL TATTOO INJECTION;  Surgeon: Eloise Harman, DO;  [facility];  Service: Endoscopy;;   TEE WITHOUT CARDIOVERSION N/A 03/13/2020   Procedure: TRANSESOPHAGEAL ECHOCARDIOGRAM (TEE);  Surgeon: [name], MD;  Location: [place];  Service: Open Heart Surgery;  Laterality: N/A;    Family History  Problem Relation Age of Onset   Hypertension Mother    Social History:  reports that he quit smoking about 27 years ago. His smoking use included cigarettes. He smoked an average of 1.5 packs per day. He has never used smokeless tobacco. He reports that he does not currently use alcohol. He reports that he does not use drugs.  Allergies:  Allergies  Allergen Reactions   Ace Inhibitors Swelling and Cough    (Not on MAR at [facility])   Haloperidol And Related     Do NOT give anti-psychotics due to risk of  Torsades and QT prolongation (per Dr. Graciela Husbands request)   Other Itching    [name]   Shellfish Allergy     Listed on MAR   Penicillins Itching and Rash    Has patient had a PCN reaction causing immediate rash, facial/tongue/throat swelling, SOB or lightheadedness with hypotension: Unknown Has patient had a PCN reaction causing severe rash involving mucus membranes or skin necrosis: Unknown Has patient had a PCN reaction that required hospitalization: Unknown Has patient had a PCN reaction occurring within the last 10 years: Unknown If all of the above answers are "NO", then may proceed with Cephalosporin use.     Medications Prior to Admission  Medication Sig Dispense Refill   acetaminophen (TYLENOL) 500 MG tablet Take 1,000 mg by mouth  every 8 (eight) hours as needed for fever or moderate pain.     albuterol (VENTOLIN HFA) 108 (90 Base) MCG/ACT inhaler Inhale 2 puffs into the lungs every 6 (six) hours as needed for wheezing or shortness of breath.     alum & mag hydroxide-simeth (MAALOX/MYLANTA) 200-200-20 MG/5ML suspension Take 30 mLs by mouth every 6 (six) hours as needed for indigestion or heartburn. 355 mL 0   amLODipine (NORVASC) 10 MG tablet Take 1 tablet (10 mg total) by mouth daily.     ascorbic acid (VITAMIN C) 500 MG tablet Take 1 tablet (500 mg total) by mouth 2 (two) times daily. 180 tablet 0   aspirin 81 MG EC tablet Take 1 tablet (81 mg total) by mouth daily with breakfast. 30 tablet 12   atorvastatin (LIPITOR) 40 MG tablet Take 1 tablet (40 mg total) by mouth at bedtime.     barrier cream (NON-SPECIFIED) CREA Apply 1 Application topically in the morning, at noon, and at bedtime. Every shift apply to buttocks     budesonide-formoterol (SYMBICORT) 160-4.5 MCG/ACT inhaler Inhale 2  puffs into the lungs in the morning and at bedtime. 1 Inhaler 6   Cholecalciferol 125 MCG (5000 UT) TABS Take 1 tablet by mouth every Monday.     clotrimazole-betamethasone (LOTRISONE) cream Apply 1 Application topically 2 (two) times daily.     daptomycin (CUBICIN) IVPB Inject 700 mg into the vein daily for 28 days. Indication:  Osteomyelitis First Dose: Yes Last Day of Therapy:  12/19/2022 Labs - Once weekly:  CBC/D, CMP, and CPK Labs - Every other week:  ESR and CRP Method of administration: IV Push Method of administration may be changed at the discretion of home infusion pharmacist based upon assessment of the patient and/or caregiver's ability to self-administer the medication ordered. 22 Units 0   ferrous gluconate (FERGON) 324 MG tablet Take 324 mg by mouth daily.     gabapentin (NEURONTIN) 300 MG capsule Take 2 capsules (600 mg total) by mouth 2 (two) times daily.     Glucerna (GLUCERNA) LIQD Take 1 Can by mouth with breakfast,  with lunch, and with evening meal.     insulin aspart (NOVOLOG FLEXPEN) 100 UNIT/ML FlexPen 0-15 Units, Subcutaneous, 3 times daily with meals CBG < 70: implement hypoglycemia protocol-call MD CBG 70 - 120: 0 units CBG 121 - 150: 2 units CBG 151 - 200: 3 units CBG 201 - 250: 5 units CBG 251 - 300: 8 units CBG 301 - 350: 11 units CBG 351 - 400: 15 units CBG > 400: 15 mL 0   ipratropium-albuterol (DUONEB) 0.5-2.5 (3) MG/3ML SOLN Take 3 mLs by nebulization every 6 (six) hours as needed. 360 mL    levothyroxine (SYNTHROID) 75 MCG tablet Take 75 mcg by mouth daily before breakfast.     lurasidone (LATUDA) 20 MG TABS tablet Take 20 mg by mouth every evening.     magnesium oxide (MAG-OX) 400 MG tablet Take 400 mg by mouth daily.     metFORMIN (GLUCOPHAGE) 1000 MG tablet Take 1,000 mg by mouth 2 (two) times daily.     metoprolol tartrate (LOPRESSOR) 50 MG tablet Take 1 tablet (50 mg total) by mouth 2 (two) times daily.     Multiple Vitamin (MULTIVITAMIN) tablet Take 1 tablet by mouth daily.     omeprazole (PRILOSEC) 40 MG capsule Take 40 mg by mouth daily.     polyethylene glycol (MIRALAX / GLYCOLAX) 17 g packet Take 17 g by mouth daily as needed for moderate constipation.     polyvinyl alcohol (LIQUIFILM TEARS) 1.4 % ophthalmic solution Place 1 drop into both eyes daily.     potassium chloride SA (KLOR-CON M) 20 MEQ tablet Take 1 tablet (20 mEq total) by mouth daily. 90 tablet 1   senna-docusate (SENOKOT-S) 8.6-50 MG tablet Take 1 tablet by mouth 2 (two) times daily.     tamsulosin (FLOMAX) 0.4 MG CAPS capsule Take 0.8 mg by mouth every evening.     thiamine (VITAMIN B-1) 100 MG tablet Take 1 tablet (100 mg total) by mouth daily.     torsemide (DEMADEX) 20 MG tablet Take 2 tablets (40 mg total) by mouth daily. 90 tablet 6   venlafaxine XR (EFFEXOR-XR) 150 MG 24 hr capsule Take 1 capsule (150 mg total) by mouth daily with breakfast.     Vitamin D, Ergocalciferol, (DRISDOL) 1.25 MG (50000 UNIT)  CAPS capsule Take 1 capsule (50,000 Units total) by mouth every 7 (seven) days. 12 capsule 0    No results found for this or any previous visit (from the past 48  hour(s)). No results found.  Review of Systems  All other systems reviewed and are negative.   There were no vitals taken for this visit. Physical Exam  Patient is alert, oriented, no adenopathy, well-dressed, normal affect, normal respiratory effort. Examination there is dehiscence of the right transtibial amputation.  There is healthy granulation tissue at the base there is exposed distal tibia there is no cellulitis no purulent drainage. Assessment/Plan 1. Right below-knee amputee ([place])   2. Dehiscence of amputation stump of right lower extremity ([place])       Plan: With wound dehiscence and exposed tibia discussed recommendation to proceed with a revision of the right transtibial amputation.  Risk and benefits were discussed including risk of the wound not healing need for additional surgery.  Patient states he understands wished to proceed with surgery.  Newt Minion, MD 12/17/2022, 6:43 AM

## 2022-12-18 ENCOUNTER — Telehealth: Payer: Medicare Other | Admitting: Infectious Diseases

## 2022-12-18 ENCOUNTER — Encounter (HOSPITAL_COMMUNITY): Payer: Self-pay | Admitting: Orthopedic Surgery

## 2022-12-18 ENCOUNTER — Inpatient Hospital Stay: Payer: Medicare Other | Admitting: Infectious Diseases

## 2022-12-18 LAB — GLUCOSE, CAPILLARY
Glucose-Capillary: 122 mg/dL — ABNORMAL HIGH (ref 70–99)
Glucose-Capillary: 93 mg/dL (ref 70–99)
Glucose-Capillary: 115 mg/dL — ABNORMAL HIGH (ref 70–99)
Glucose-Capillary: 101 mg/dL — ABNORMAL HIGH (ref 70–99)

## 2022-12-18 LAB — BASIC METABOLIC PANEL
Anion gap: 14 (ref 5–15)
BUN: 40 mg/dL — ABNORMAL HIGH (ref 6–20)
CO2: 22 mmol/L (ref 22–32)
Calcium: 8.6 mg/dL — ABNORMAL LOW (ref 8.9–10.3)
Chloride: 100 mmol/L (ref 98–111)
Creatinine, Ser: 1.66 mg/dL — ABNORMAL HIGH (ref 0.61–1.24)
GFR, Estimated: 47 mL/min — ABNORMAL LOW (ref >60)
Glucose, Bld: 108 mg/dL — ABNORMAL HIGH (ref 70–99)
Potassium: 3.7 mmol/L (ref 3.5–5.1)
Sodium: 136 mmol/L (ref 135–145)

## 2022-12-18 LAB — CBC
HCT: 23.5 % — ABNORMAL LOW (ref 39.0–52.0)
Hemoglobin: 7.7 g/dL — ABNORMAL LOW (ref 13.0–17.0)
MCH: 30.6 pg (ref 26.0–34.0)
MCHC: 32.8 g/dL (ref 30.0–36.0)
MCV: 93.3 fL (ref 80.0–100.0)
Platelets: 342 10*3/uL (ref 150–400)
RBC: 2.52 MIL/uL — ABNORMAL LOW (ref 4.22–5.81)
RDW: 14.4 % (ref 11.5–15.5)
WBC: 14.3 10*3/uL — ABNORMAL HIGH (ref 4.0–10.5)
nRBC: 0 % (ref 0.0–0.2)

## 2022-12-18 LAB — MRSA NEXT GEN BY PCR, NASAL: MRSA by PCR Next Gen: NOT DETECTED

## 2022-12-18 NOTE — Anesthesia Postprocedure Evaluation (Signed)
Anesthesia Post Note  Patient: Shane Chambers  Procedure(s) Performed: RIGHT BELOW KNEE AMPUTATION REVISION (Right: Leg Lower)     Patient location during evaluation: PACU Anesthesia Type: MAC and Regional Level of consciousness: awake and alert Pain management: pain level controlled Vital Signs Assessment: post-procedure vital signs reviewed and stable Respiratory status: spontaneous breathing, nonlabored ventilation, respiratory function stable and patient connected to nasal cannula oxygen Cardiovascular status: stable and blood pressure returned to baseline Postop Assessment: no apparent nausea or vomiting Anesthetic complications: no   No notable events documented.  Last Vitals:  Vitals:   12/18/22 0756 12/18/22 1358  BP:  129/61  Pulse:  83  Resp:  17  Temp:  37.2 C  SpO2: 96% 94%    Last Pain:  Vitals:   12/18/22 1358  TempSrc: Oral  PainSc:                  [name]

## 2022-12-18 NOTE — Progress Notes (Signed)
IP rehab admissions - Noted Dr. Sharol Given anticipating DC to SNF.  Also noted PT/OT recommending SNF.  Patient was from a SNF.  Agree with need for return to SNF for continued rehab.  I will sign off for inpatient rehab at this time.  Call me for questions.  [phone]

## 2022-12-18 NOTE — TOC Initial Note (Addendum)
Transition of Care [facility]) - Initial/Assessment Note    Patient Details  Name: Shane Chambers MRN: [id number] Date of Birth: April 27, 1961  Transition of [facility]) CM/SW Contact:    Shane Chars, LCSW Phone Number: 12/18/2022, 2:55 PM  Clinical Narrative:   CSW met with pt regarding DC recommendation for SNF.  Pt confirms he is LTC resident at [facility], has been there since 11/2020.  Pt does plan to return, is agreeable to rehab upon his return, and his hoping to eventually get his own apartment.  Permission given to speak to his mother, Shane Chambers.  CSW confirmed pt can return with Shane Chambers/[facility] rehab.                 Admit to inpatient order 2/7  Expected Discharge Plan: Skilled Nursing Facility Barriers to Discharge: Continued Medical Work up   Patient Goals and CMS Choice Patient states their goals for this hospitalization and ongoing recovery are:: eventually get an apartment   Choice offered to / list presented to : Patient (admitted from [facility] and wants to return)      Expected Discharge Plan and Services In-house Referral: Clinical Social Work   Post Acute Care Choice: [place] Living arrangements for the past 2 months: [place]                                      Prior Living Arrangements/Services Living arrangements for the past 2 months: [place] Lives with:: Facility Resident Patient language and need for interpreter reviewed:: Yes Do you feel safe going back to the place where you live?: Yes      Need for Family Participation in Patient Care: No (Comment) Care giver support system in place?: Yes (comment) Current home services: Other (comment) (na) Criminal Activity/Legal Involvement Pertinent to Current Situation/Hospitalization: No - Comment as needed  Activities of Daily Living Home Assistive Devices/Equipment: Wheelchair ADL Screening (condition at time of  admission) Patient's cognitive ability adequate to safely complete daily activities?: Yes Is the patient deaf or have difficulty hearing?: No Does the patient have difficulty seeing, even when wearing glasses/contacts?: No Does the patient have difficulty concentrating, remembering, or making decisions?: No Patient able to express need for assistance with ADLs?: Yes Does the patient have difficulty dressing or bathing?: Yes Independently performs ADLs?: No Communication: Independent Dressing (OT): Needs assistance Grooming: Needs assistance Feeding: Independent Bathing: Dependent Toileting: Dependent In/Out Bed: Dependent Walks in Home: Dependent Does the patient have difficulty walking or climbing stairs?: Yes Weakness of Legs: Both Weakness of Arms/Hands: Both  Permission Sought/Granted Permission sought to share information with : Family Supports Permission granted to share information with : Yes, Verbal Permission Granted  Share Information with NAME: mother Shane Chambers  Permission granted to share info w AGENCY: [facility]        Emotional Assessment Appearance:: Appears stated age Attitude/Demeanor/Rapport: Engaged Affect (typically observed): Appropriate, Pleasant Orientation: : Oriented to Self, Oriented to Place, Oriented to  Time, Oriented to Situation      Admission diagnosis:  S/P BKA (below knee amputation) unilateral, right ([place]) [Q33.354] Patient Active Problem List   Diagnosis Date Noted   S/P BKA (below knee amputation) unilateral, right ([place]) 12/17/2022   MRSA infection 11/19/2022   Amputation stump infection ([place]) 11/19/2022   QT prolongation 11/10/2022   Severe recurrent major depression without psychotic features ([place]) 11/05/2022   Major  depressive disorder with psychotic features ([place]) 11/02/2022   Acute hypoactive delirium due to another medical condition 10/13/2022   Septic arthritis of right ankle ([place]) 10/04/2022   Acute osteomyelitis ([place])  10/02/2022   Subacute osteomyelitis, right ankle and foot ([place]) 10/02/2022   Hypothyroidism 10/02/2022   Hypoalbuminemia 10/02/2022   Cellulitis of right lower extremity 09/25/2022   Lactic acidosis 04/23/2022   Anemia of chronic disease 04/23/2022   Obesity, Class III, BMI 40-49.9 (morbid obesity) ([place]) 04/23/2022   AKI (acute kidney injury) ([place]) 04/22/2022   Elevated troponin 04/22/2022   Cancer of sigmoid colon ([place]) 12/16/2021   Constipation 12/16/2021   GERD (gastroesophageal reflux disease) 12/16/2021   Iron deficiency anemia 09/17/2021   Rectal cancer ([place]) 09/03/2021   Bilateral lower leg cellulitis 48/18/5631   Systolic and diastolic CHF, chronic ([place]) 11/22/2020   CHF exacerbation ([place]) 08/01/2020   Visit for wound check 05/28/2020   OSA (obstructive sleep apnea) 05/24/2020   Acute exacerbation of chronic obstructive pulmonary disease (COPD) ([place]) 04/29/2020   Leukocytosis 04/29/2020   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   Ischemic cardiomyopathy    Coronary artery disease    Acute on chronic combined systolic and diastolic CHF (congestive heart failure) ([place]) 03/05/2020   Acute hyponatremia 03/05/2020   Severe Vitamin D deficiency 12/02/2019   Hypokalemia 12/01/2019   Hyperlipidemia 12/01/2019   BPH (benign prostatic hyperplasia) 12/01/2019   Normocytic anemia 12/01/2019   Pneumonia due to COVID-19 virus 12/01/2019   Hypoxia    SOB (shortness of breath) 11/16/2019   Severe sepsis ([place]) 11/16/2019   Hypertension    Type 2 diabetes mellitus ([place])    Depression    Acute bronchitis with bronchospasm 11/15/2019   PCP:  Caprice Renshaw, MD Pharmacy:   [facility] [address] [place], [place] - [address] [place] [address] Phone: [phone] Fax: [phone]     Social Determinants of Health (SDOH) Social History: SDOH Screenings   Food Insecurity: No Food Insecurity (12/17/2022)  Housing: Low Risk  (12/17/2022)  Transportation Needs: No Transportation  Needs (12/17/2022)  Utilities: Not At Risk (12/17/2022)  Alcohol Screen: Low Risk  (11/05/2022)  Tobacco Use: Medium Risk (12/18/2022)   SDOH Interventions:     Readmission Risk Interventions    11/11/2022    2:05 PM 10/03/2022   10:08 AM 09/26/2022    1:16 PM  Readmission Risk Prevention Plan  Transportation Screening  Complete Complete  PCP or Specialist Appt within 3-5 Days   Complete  HRI or [place]   Complete  Social Work Consult for [place] Planning/Counseling   Complete  Palliative Care Screening   Not Applicable  Medication Review Press photographer)   Complete  HRI or [place]  Complete   SW Recovery Care/Counseling Consult  Complete   Palliative Care Screening  Not Applicable   Skilled Nursing Facility  Complete      Information is confidential and restricted. Go to Review Flowsheets to unlock data.

## 2022-12-18 NOTE — NC FL2 (Signed)
[place] LEVEL OF CARE FORM     IDENTIFICATION  Patient Name: Shane Chambers Birthdate: [birth year] Sex: male Admission Date (Current Location): 12/17/2022  [place] and [place] Number:  Shane Chambers [id number] [place] and Address:  [facility] [place] [place] [address] [place], [place] [address]      Provider Number: [id number]  Attending Physician Name and Address:  Newt Minion, MD  Relative Name and Phone Number:  [name], [name] Mother [phone]    Current Level of Care: Hospital Recommended Level of Care: [name] Prior Approval Number:    Date Approved/Denied:   PASRR Number: [id number]  Discharge Plan: SNF    Current Diagnoses: Patient Active Problem List   Diagnosis Date Noted   S/P BKA (below knee amputation) unilateral, right ([place]) 12/17/2022   MRSA infection 11/19/2022   Amputation stump infection ([place]) 11/19/2022   QT prolongation 11/10/2022   Severe recurrent major depression without psychotic features ([place]) 11/05/2022   Major depressive disorder with psychotic features ([place]) 11/02/2022   Acute hypoactive delirium due to another medical condition 10/13/2022   Septic arthritis of right ankle ([place]) 10/04/2022   Acute osteomyelitis ([place]) 10/02/2022   Subacute osteomyelitis, right ankle and foot ([place]) 10/02/2022   Hypothyroidism 10/02/2022   Hypoalbuminemia 10/02/2022   Cellulitis of right lower extremity 09/25/2022   Lactic acidosis 04/23/2022   Anemia of chronic disease 04/23/2022   Obesity, Class III, BMI 40-49.9 (morbid obesity) ([place]) 04/23/2022   AKI (acute kidney injury) ([place]) 04/22/2022   Elevated troponin 04/22/2022   Cancer of sigmoid colon ([name]) 12/16/2021   Constipation 12/16/2021   GERD (gastroesophageal reflux disease) 12/16/2021   Iron deficiency anemia 09/17/2021   Rectal cancer ([place]) 09/03/2021   Bilateral lower leg cellulitis 31/51/7616   Systolic and diastolic CHF, chronic ([place]) 11/22/2020    CHF exacerbation ([place]) 08/01/2020   Visit for wound check 05/28/2020   OSA (obstructive sleep apnea) 05/24/2020   Acute exacerbation of chronic obstructive pulmonary disease (COPD) ([place]) 04/29/2020   Leukocytosis 04/29/2020   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   Ischemic cardiomyopathy    Coronary artery disease    Acute on chronic combined systolic and diastolic CHF (congestive heart failure) ([place]) 03/05/2020   Acute hyponatremia 03/05/2020   Severe Vitamin D deficiency 12/02/2019   Hypokalemia 12/01/2019   Hyperlipidemia 12/01/2019   BPH (benign prostatic hyperplasia) 12/01/2019   Normocytic anemia 12/01/2019   Pneumonia due to COVID-19 virus 12/01/2019   Hypoxia    SOB (shortness of breath) 11/16/2019   Severe sepsis ([place]) 11/16/2019   Hypertension    Type 2 diabetes mellitus ([place])    Depression    Acute bronchitis with bronchospasm 11/15/2019    Orientation RESPIRATION BLADDER Height & Weight     Self, Time, Situation, Place  Normal Incontinent Weight: 230 lb (104.3 kg) Height:  '5\' 9"'$  (175.3 cm)  BEHAVIORAL SYMPTOMS/MOOD NEUROLOGICAL BOWEL NUTRITION STATUS      Incontinent Diet (see discharge summary)  AMBULATORY STATUS COMMUNICATION OF NEEDS Skin   Total Care Verbally Surgical wounds                       Personal Care Assistance Level of Assistance  Bathing, Feeding, Dressing, Total care Bathing Assistance: Maximum assistance Feeding assistance: Limited assistance Dressing Assistance: Maximum assistance Total Care Assistance: Maximum assistance   Functional Limitations Info  Sight, Hearing, Speech Sight Info: Adequate Hearing Info: Adequate Speech Info: Adequate    SPECIAL CARE FACTORS FREQUENCY  PT (By licensed PT), OT (By licensed OT)     PT Frequency: 5x week OT Frequency: 5x week            Contractures Contractures Info: Not present    Additional Factors Info  Code Status, Allergies Code Status Info: full Allergies Info: Ace Inhibitors,  Haloperidol And Related, Other, Shellfish Allergy, Penicillins           Current Medications (12/18/2022):  This is the current hospital active medication list Current Facility-Administered Medications  Medication Dose Route Frequency Provider Last Rate Last Admin   0.9 %  sodium chloride infusion   Intravenous Continuous Newt Minion, MD 15 mL/hr at 12/18/22 0700 Infusion Verify at 12/18/22 0700   acetaminophen (TYLENOL) tablet 325-650 mg  325-650 mg Oral Q6H PRN Newt Minion, MD       albuterol (PROVENTIL) (2.5 MG/3ML) 0.083% nebulizer solution 2.5 mg  2.5 mg Inhalation Q6H PRN Newt Minion, MD       alum & mag hydroxide-simeth (MAALOX/MYLANTA) 200-200-20 MG/5ML suspension 15-30 mL  15-30 mL Oral Q2H PRN Newt Minion, MD       amLODipine (NORVASC) tablet 10 mg  10 mg Oral Daily Newt Minion, MD   10 mg at 12/18/22 6967   ascorbic acid (VITAMIN C) tablet 1,000 mg  1,000 mg Oral Daily Newt Minion, MD   1,000 mg at 12/18/22 8938   aspirin EC tablet 81 mg  81 mg Oral Q breakfast Newt Minion, MD   81 mg at 12/18/22 0853   atorvastatin (LIPITOR) tablet 40 mg  40 mg Oral QHS Newt Minion, MD   40 mg at 12/17/22 2128   bisacodyl (DULCOLAX) EC tablet 5 mg  5 mg Oral Daily PRN Newt Minion, MD       Chlorhexidine Gluconate Cloth 2 % PADS 6 each  6 each Topical Daily Newt Minion, MD   6 each at 12/18/22 1355   [START ON 12/22/2022] cholecalciferol (VITAMIN D3) 25 MCG (1000 UNIT) tablet 5,000 Units  5,000 Units Oral Q Rosine Door, MD       DAPTOmycin (CUBICIN) 700 mg in sodium chloride 0.9 % IVPB  700 mg Intravenous Q2000 Newt Minion, MD   Stopped at 12/17/22 2113   docusate sodium (COLACE) capsule 100 mg  100 mg Oral Daily Newt Minion, MD   100 mg at 12/18/22 1017   ferrous gluconate (FERGON) tablet 324 mg  324 mg Oral Daily Newt Minion, MD   324 mg at 12/18/22 0853   gabapentin (NEURONTIN) capsule 600 mg  600 mg Oral BID Newt Minion, MD   600 mg at 12/18/22 0852    guaiFENesin-dextromethorphan (ROBITUSSIN DM) 100-10 MG/5ML syrup 15 mL  15 mL Oral Q4H PRN Newt Minion, MD       hydrALAZINE (APRESOLINE) injection 5 mg  5 mg Intravenous Q20 Min PRN Newt Minion, MD       HYDROmorphone (DILAUDID) injection 0.5-1 mg  0.5-1 mg Intravenous Q4H PRN Newt Minion, MD   1 mg at 12/18/22 0634   insulin aspart (novoLOG) injection 0-15 Units  0-15 Units Subcutaneous TID WC Newt Minion, MD   2 Units at 12/18/22 0853   insulin aspart (novoLOG) injection 4 Units  4 Units Subcutaneous TID WC Newt Minion, MD       ipratropium-albuterol (DUONEB) 0.5-2.5 (3) MG/3ML nebulizer solution 3 mL  3 mL Nebulization Q6H PRN  Newt Minion, MD       labetalol (NORMODYNE) injection 10 mg  10 mg Intravenous Q10 min PRN Newt Minion, MD       levothyroxine (SYNTHROID) tablet 75 mcg  75 mcg Oral Q0600 Newt Minion, MD   75 mcg at 12/18/22 4097   lurasidone (LATUDA) tablet 20 mg  20 mg Oral QPM Newt Minion, MD   20 mg at 12/17/22 1730   magnesium citrate solution 1 Bottle  1 Bottle Oral Once PRN Newt Minion, MD       magnesium oxide (MAG-OX) tablet 400 mg  400 mg Oral Daily Newt Minion, MD   400 mg at 12/18/22 3532   magnesium sulfate IVPB 2 g 50 mL  2 g Intravenous Daily PRN Newt Minion, MD       metFORMIN (GLUCOPHAGE) tablet 1,000 mg  1,000 mg Oral BID WC Newt Minion, MD   1,000 mg at 12/18/22 0852   metoprolol tartrate (LOPRESSOR) injection 2-5 mg  2-5 mg Intravenous Q2H PRN Newt Minion, MD       metoprolol tartrate (LOPRESSOR) tablet 50 mg  50 mg Oral BID Newt Minion, MD   50 mg at 12/18/22 9924   mometasone-formoterol (DULERA) 200-5 MCG/ACT inhaler 2 puff  2 puff Inhalation BID Newt Minion, MD   2 puff at 12/18/22 2683   nutrition supplement (JUVEN) (JUVEN) powder packet 1 packet  1 packet Oral BID BM Newt Minion, MD   1 packet at 12/18/22 1354   ondansetron (ZOFRAN) injection 4 mg  4 mg Intravenous Q6H PRN Newt Minion, MD       oxyCODONE  (Oxy IR/ROXICODONE) immediate release tablet 10-15 mg  10-15 mg Oral Q4H PRN Newt Minion, MD   10 mg at 12/17/22 1739   oxyCODONE (Oxy IR/ROXICODONE) immediate release tablet 5-10 mg  5-10 mg Oral Q4H PRN Newt Minion, MD   5 mg at 12/18/22 0857   pantoprazole (PROTONIX) EC tablet 40 mg  40 mg Oral Daily Newt Minion, MD   40 mg at 12/18/22 0852   phenol (CHLORASEPTIC) mouth spray 1 spray  1 spray Mouth/Throat PRN Newt Minion, MD       polyethylene glycol (MIRALAX / GLYCOLAX) packet 17 g  17 g Oral Daily PRN Newt Minion, MD       potassium chloride SA (KLOR-CON M) CR tablet 20 mEq  20 mEq Oral Daily Newt Minion, MD   20 mEq at 12/18/22 0853   potassium chloride SA (KLOR-CON M) CR tablet 20-40 mEq  20-40 mEq Oral Daily PRN Newt Minion, MD       sodium chloride flush (NS) 0.9 % injection 10-40 mL  10-40 mL Intracatheter Q12H Newt Minion, MD   10 mL at 12/18/22 0854   sodium chloride flush (NS) 0.9 % injection 10-40 mL  10-40 mL Intracatheter PRN Newt Minion, MD       tamsulosin [facility]) capsule 0.8 mg  0.8 mg Oral QPM Newt Minion, MD   0.8 mg at 12/17/22 1730   torsemide (DEMADEX) tablet 40 mg  40 mg Oral Daily Newt Minion, MD   40 mg at 12/18/22 0852   venlafaxine XR (EFFEXOR-XR) 24 hr capsule 150 mg  150 mg Oral Q breakfast Newt Minion, MD   150 mg at 12/18/22 4196   zinc sulfate capsule 220 mg  220 mg Oral Daily Sharol Given,  Illene Regulus, MD   220 mg at 12/18/22 8916     Discharge Medications: Please see discharge summary for a list of discharge medications.  Relevant Imaging Results:  Relevant Lab Results:   Additional Information SSN: [id number]  Joanne Chars, LCSW

## 2022-12-18 NOTE — Evaluation (Signed)
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 Pt is now s/p excisional debridement & drainage of abscess & application of wound vac on RLE residual limb on 11/14/22,  revision and closure of RLE residual limb 11/18/22. PMH significant for CAD s/p CABG, HFrEF with recovered EF, PVD, type 2 diabetes, COPD, hypertension, hyperlipidemia, hypothyroidism, depression   Clinical Impression   PTA, pt was at [facility] and receiving assistance with all ADL. Upon eval, pt presenting with generalized weakness, decreased balance, problem solving, and safety limiting meaningful participation in ADL and functional mobility. Pt performing UB ADL with min A and LB ADL with total A +2. Pt requiring +2 max-total A with stedy for STS transfers. Pt with BUE ROM limitations in hands at baseline. Recommending return to SNF for continued OT services.      Recommendations for follow up therapy are one component of a multi-disciplinary discharge planning process, led by the attending physician.  Recommendations may be updated based on patient status, additional functional criteria and insurance authorization.   Follow Up Recommendations  Skilled nursing-short term rehab (<3 hours/day)     Assistance Recommended at Discharge Frequent or constant Supervision/Assistance  Patient can return home with the following Two people to help with walking and/or transfers;A lot of help with bathing/dressing/bathroom;A lot of help with walking and/or transfers;Two people to help with bathing/dressing/bathroom;Assistance with cooking/housework;Assistance with feeding;Direct supervision/assist for medications management;Direct supervision/assist for financial management;Assist for  transportation;Help with stairs or ramp for entrance    Functional Status Assessment  Patient has had a recent decline in their functional status and demonstrates the ability to make significant improvements in function in a reasonable and predictable amount of time.  Equipment Recommendations  Other (comment) (defer)    Recommendations for Other Services       Precautions / Restrictions Restrictions Weight Bearing Restrictions: Yes RLE Weight Bearing: Weight bearing as tolerated      Mobility Bed Mobility Overal bed mobility: Needs Assistance Bed Mobility: Supine to Sit, Sit to Supine     Supine to sit: Min assist Sit to supine: Min assist   General bed mobility comments: Able to pull into long sit in bed with min guard A. Assist for management of LE to EOB and for scooting forward    Transfers Overall transfer level: Needs assistance Equipment used: Ambulation equipment used Charlaine Dalton) Transfers: Sit to/from Stand Sit to Stand: Max assist, Total assist, +2 physical assistance, +2 safety/equipment           General transfer comment: Pt with good effort, but ultimately max-total A +2 for STS. Max facilitation of anterior weight shift as well as for rise. Provided VC for pulling chest forward toward bar of stedy, and pt able to pull toward therapist EOB, but difficulty coordinating during STS. Able to come to squat position, but unable to tuck bottom under to reach full standing position      Balance Overall balance assessment: Needs assistance Sitting-balance support: Single extremity supported, Bilateral upper extremity supported, Feet supported Sitting balance-Leahy Scale: Poor Sitting balance - Comments: up to min A for sitting balance EOB with one UE supported Postural control: Posterior lean Standing balance support: Bilateral upper extremity supported, During functional activity, Reliant on assistive device for balance Standing balance-Leahy Scale: Zero  Standing  balance comment: reliant on AD and max-total external support                           ADL either performed or assessed with clinical judgement   ADL Overall ADL's : Needs assistance/impaired Eating/Feeding: Minimal assistance;Sitting Eating/Feeding Details (indicate cue type and reason): intermittent min A for balance to drink from cup with straw and lid sitting EOB Grooming: Minimal assistance;Sitting   Upper Body Bathing: Min guard;Minimal assistance;Sitting   Lower Body Bathing: Maximal assistance;Sitting/lateral leans   Upper Body Dressing : Minimal assistance;Sitting   Lower Body Dressing: Total assistance;+2 for physical assistance;+2 for safety/equipment;Sit to/from stand   Toilet Transfer: Maximal assistance;Total assistance;+2 for physical assistance;+2 for safety/equipment (stedy STS; unable to come to full stand this session)   Toileting- Clothing Manipulation and Hygiene: Maximal assistance;+2 for physical assistance;Bed level Toileting - Clothing Manipulation Details (indicate cue type and reason): Pt lifting leg appropriately to make wiping more accessible for therapist     Functional mobility during ADLs: Maximal assistance;Total assistance;+2 for physical assistance;+2 for safety/equipment (max-total A for STS with stedy) General ADL Comments: max cues for anterior weightshift     Vision Baseline Vision/History: 1 Wears glasses Ability to See in Adequate Light: 0 Adequate Patient Visual Report: No change from baseline Vision Assessment?: No apparent visual deficits     Perception Perception Perception Tested?: No   Praxis Praxis Praxis tested?: Not tested    Pertinent Vitals/Pain Pain Assessment Pain Assessment: Faces Faces Pain Scale: Hurts little more Pain Location: RLE during mobility Pain Descriptors / Indicators: Discomfort, Sore Pain Intervention(s): Limited activity within patient's tolerance, Monitored during session, Repositioned, RN  gave pain meds during session     Hand Dominance Right   Extremity/Trunk Assessment Upper Extremity Assessment Upper Extremity Assessment: [name] deficits/detail;LUE deficits/detail [name] Deficits / Details: preference for flexed position of the digits at the PIP joints. 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 Decreased grip strength affecting ability to perform in-hand manipulation, open containers/bags, etc. 4+/5 pull; 4-/5 grip. Suspect limitations in shoulder ROM, but not formally assessed. [name] Coordination: decreased fine motor;decreased gross motor LUE Deficits / Details: preference for flexed position of the digits at the PIP joints. third digit unable to fully extend.  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 Decreased grip strength affecting ability to perform in-hand manipulation, open containers/bags, etc. 4+/5 pull; 4-/5 grip. Suspect limitations in shoulder ROM, but not formally assessed. LUE Coordination: decreased fine motor;decreased gross motor   Lower Extremity Assessment Lower Extremity Assessment: Defer to PT evaluation   Cervical / Trunk Assessment Cervical / Trunk Assessment: Other exceptions (forward rounding of shoulders)   Communication Communication Communication: No difficulties   Cognition Arousal/Alertness: Awake/alert, Lethargic (intermittent lethargy) Behavior During Therapy: WFL for tasks assessed/performed, Flat affect Overall Cognitive Status: No family/caregiver present to determine baseline cognitive functioning Area of Impairment: Orientation, Attention, Memory, Following commands, Safety/judgement, Awareness, Problem solving                 Orientation Level: Disoriented to, Time (date and day of week. With significantly increased time, was oriented to month/year) Current Attention Level: Sustained (Bouts of sustained attention, but intermittent lethargy requiring cues to stay  aroused) Memory: Decreased short-term memory Following Commands: Follows one step commands consistently, Follows one step commands with increased time, Follows multi-step commands inconsistently (VCS to follow  commands at times) Safety/Judgement: Decreased awareness of safety Awareness: Emergent (Mostly aware of current level of function as well as current limitations.) Problem Solving: Requires verbal cues, Requires tactile cues, Difficulty sequencing General Comments: Cues for sequencing and safety during session. Slowed processing overall. Aware his thinking is different than it used to be but difficulty reporting what is different, and occasionally decreased insight into what will be difficult for him. Decreased awareness of occupation based treatment (reporting they had been strengthening at rehabilitation, but had not been attempting to stand, and benefited from education regarding continued attempts to stand as part of the rehabilitation process)     General Comments  VSS    Exercises     Shoulder Instructions      Home Living Family/patient expects to be discharged to:: Skilled nursing facility                                 Additional Comments: Has been at the [facility] in [place]      Prior Functioning/Environment Prior Level of Function : Needs assist       Physical Assist : Mobility (physical)     Mobility Comments: Has been being lifted to chair since most recent revision of BKA ADLs Comments: assist for all ADL/IADL        OT Problem List: Decreased strength;Decreased activity tolerance;Decreased range of motion;Impaired balance (sitting and/or standing);Impaired vision/perception;Impaired UE functional use;Decreased knowledge of precautions;Decreased knowledge of use of DME or AE;Decreased safety awareness;Decreased cognition;Pain      OT Treatment/Interventions: Self-care/ADL training;Therapeutic exercise;DME and/or AE instruction;Balance  training;Patient/family education;Cognitive remediation/compensation;Therapeutic activities    OT Goals(Current goals can be found in the care plan section) Acute Rehab OT Goals Patient Stated Goal: get stronger OT Goal Formulation: With patient Time For Goal Achievement: 01/01/23 Potential to Achieve Goals: Good  OT Frequency: Min 2X/week    Co-evaluation              AM-PAC OT "6 Clicks" Daily Activity     Outcome Measure Help from another person eating meals?: A Little Help from another person taking care of personal grooming?: A Little Help from another person toileting, which includes using toliet, bedpan, or urinal?: Total Help from another person bathing (including washing, rinsing, drying)?: A Lot Help from another person to put on and taking off regular upper body clothing?: A Little Help from another person to put on and taking off regular lower body clothing?: Total 6 Click Score: 13   End of Session Equipment Utilized During Treatment: Gait belt;Other (comment) Charlaine Dalton) Nurse Communication: Mobility status;Other (comment) (needs new condom cath, and done with therapy to continue taking remainder of medications)  Activity Tolerance: Patient tolerated treatment well;Patient limited by lethargy Patient left: in bed;with call bell/phone within reach;with bed alarm set;with nursing/sitter in room  OT Visit Diagnosis: Unsteadiness on feet (R26.81);Muscle weakness (generalized) (M62.81);Other abnormalities of gait and mobility (R26.89);History of falling (Z91.81);Other symptoms and signs involving cognitive function;Pain Pain - Right/Left: Right Pain - part of body: Leg                Time: 7829-5621 OT Time Calculation (min): 41 min Charges:  OT General Charges $OT Visit: 1 Visit OT Evaluation $OT Eval Moderate Complexity: 1 Mod OT Treatments $Self Care/Home Management : 8-22 mins  Elder Cyphers, OTR/L [facility] Acute Rehabilitation Office:  [phone]    Magnus Ivan 12/18/2022, 10:38 AM

## 2022-12-18 NOTE — Progress Notes (Signed)
   12/18/22 1530  Spiritual Encounters  Type of Visit Initial  Care provided to: Patient  Conversation partners present during encounter Nurse Verline Lema)  Referral source Patient request  Reason for visit Urgent spiritual support  OnCall Visit No  Spiritual Framework  Presenting Themes Meaning/purpose/sources of inspiration;Significant life change;Coping tools;Courage hope and growth  Patient Stress Factors Health changes;Loss of control;Lack of caregivers;Family relationships  Family Stress Factors Family relationships  Goals  Self/Personal Goals He wants very badly to get out of the nursing home he is currently in  Interventions  Spiritual Care Interventions Made Established relationship of care and support;Compassionate presence;Reflective listening;Normalization of emotions;Reconciliation with self/others;Explored values/beliefs/practices/strengths;Prayer;Encouragement  Intervention Outcomes  Outcomes Reduced anxiety;Connection to spiritual care;Reduced isolation  Spiritual Care Plan  Spiritual Care Issues Still Outstanding Chaplain will continue to follow   Patient presented as deeply melancholy.  Did not express any suicidal ideation to me, but did express that "he would rather die outside of the nursing home than live in it." Loss of control and loss of loving relationship seem to be big stressors in his life.  He has gone from short stay/rehab in a nursing home to long term care and carries a lot of anger about that. Patient requested prayer and chaplain discussed with him the power of prayer and it's availability to him as a coping tool and as a means of finding hope in his difficult situation.  Chaplain spent time listening and we discussed the idea of forgiving those who have hurt him, and how forgiveness frees [place] from a cage.  He requested further conversation with chaplains. I will follow up in a few days.

## 2022-12-18 NOTE — Progress Notes (Signed)
Patient ID: Shane Chambers, male   DOB: 1961-04-16, 62 y.o.   MRN: [id number] Patient is a 62 year old gentleman postoperative day 1 right below-knee amputation.  Patient is resting comfortably there is no drainage in the wound VAC canister a Foley is in place heart rate 88.  Anticipate patient will need discharge to skilled nursing facility.

## 2022-12-18 NOTE — Evaluation (Signed)
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 Pt is now s/p excisional debridement & drainage of abscess & application of wound vac on RLE residual limb on 11/14/22,  revision and closure of RLE residual limb 11/18/22. PMH significant for CAD s/p CABG, HFrEF with recovered EF, PVD, type 2 diabetes, COPD, hypertension, hyperlipidemia, hypothyroidism, depression  Clinical Impression  Pt admitted with above diagnosis. Pt from SNF where he reports he had been doing exercises but was being lifted to chair and had been unable to stand in some time. He would like to work toward being able to transfer without using lift. On eval, pt with generalized weakness as well as lethargy and problem solving deficits with this.  Able to mobilize in bed with min A but was unable to achieve standing in stedy with max A +2. Pt with LLE weakness as well as UE limitations in grip strength upper body strength. Will work towards increasing pt participation in transfers. Pt currently with functional limitations due to the deficits listed below (see PT Problem List). Pt will benefit from skilled PT to increase their independence and safety with mobility to allow discharge to the venue listed below.          Recommendations for follow up therapy are one component of a multi-disciplinary discharge planning process, led by the attending physician.  Recommendations may be updated based on patient status, additional functional criteria and insurance authorization.  Follow Up Recommendations Skilled nursing-short term rehab (<3 hours/day) Can patient physically be transported by private vehicle: No    Assistance Recommended at Discharge Frequent or constant  Supervision/Assistance  Patient can return home with the following  Two people to help with walking and/or transfers;A lot of help with bathing/dressing/bathroom;Assistance with cooking/housework;Direct supervision/assist for medications management;Direct supervision/assist for financial management;Assist for transportation;Help with stairs or ramp for entrance    Equipment Recommendations None recommended by PT  Recommendations for Other Services       Functional Status Assessment Patient has had a recent decline in their functional status and demonstrates the ability to make significant improvements in function in a reasonable and predictable amount of time.     Precautions / Restrictions Precautions Precautions: Fall Restrictions Weight Bearing Restrictions: Yes RLE Weight Bearing: Non weight bearing      Mobility  Bed Mobility Overal bed mobility: Needs Assistance Bed Mobility: Supine to Sit, Sit to Supine, Rolling Rolling: Supervision   Supine to sit: Min assist Sit to supine: Min assist   General bed mobility comments: Able to pull into long sit in bed with min guard A. Assist for management of LE to EOB and for scooting forward. Pt able to roll R and L for perineal care after attempting STS    Transfers Overall transfer level: Needs assistance Equipment used: Ambulation equipment used (stedy) Transfers: Sit to/from Stand Sit to Stand: Max assist, Total assist, +2 physical assistance, +2 safety/equipment, From elevated surface           General transfer comment: Pt with good effort, but ultimately max-total A +2 for STS. Max facilitation of anterior weight shift as well as for rise. Provided VC for pulling chest forward toward bar of stedy, and pt able to pull toward therapist EOB, but difficulty coordinating during STS.  Able to come to squat position, but unable to tuck bottom under to reach full standing position    Ambulation/Gait               General  Gait Details: unable  Stairs            Wheelchair Mobility    Modified Rankin (Stroke Patients Only)       Balance Overall balance assessment: Needs assistance Sitting-balance support: Single extremity supported, Bilateral upper extremity supported, Feet supported Sitting balance-Leahy Scale: Poor Sitting balance - Comments: up to min A for sitting balance EOB with one UE supported Postural control: Posterior lean Standing balance support: Bilateral upper extremity supported, During functional activity, Reliant on assistive device for balance Standing balance-Leahy Scale: Zero Standing balance comment: reliant on AD and max-total external support                             Pertinent Vitals/Pain Pain Assessment Pain Assessment: Faces Faces Pain Scale: Hurts little more Pain Location: RLE during mobility Pain Descriptors / Indicators: Discomfort, Sore Pain Intervention(s): Limited activity within patient's tolerance, Monitored during session    Home Living Family/patient expects to be discharged to:: Skilled nursing facility                   Additional Comments: Has been at the [facility] in [place]    Prior Function Prior Level of Function : Needs assist       Physical Assist : Mobility (physical) Mobility (physical): Transfers;Gait   Mobility Comments: Has been being lifted to chair since most recent revision of BKA ADLs Comments: assist for all ADL/IADL     Hand Dominance   Dominant Hand: Right    Extremity/Trunk Assessment   Upper Extremity Assessment Upper Extremity Assessment: Generalized weakness;Defer to OT evaluation [name] Deficits / Details: preference for flexed position of the digits at the PIP joints. 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 Decreased grip strength affecting ability to perform in-hand manipulation, open containers/bags, etc. 4+/5 pull; 4-/5 grip. Suspect limitations in  shoulder ROM, but not formally assessed. [name] Coordination: decreased fine motor;decreased gross motor LUE Deficits / Details: preference for flexed position of the digits at the PIP joints. third digit unable to fully extend.  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 Decreased grip strength affecting ability to perform in-hand manipulation, open containers/bags, etc. 4+/5 pull; 4-/5 grip. Suspect limitations in shoulder ROM, but not formally assessed. LUE Coordination: decreased fine motor;decreased gross motor    Lower Extremity Assessment Lower Extremity Assessment: Generalized weakness;RLE deficits/detail;LLE deficits/detail RLE Deficits / Details: able to flex R knee to 90 deg and extend to 0, hip flex >3/5, tolerating limb protector LLE Deficits / Details: hip flex 3/5, knee ext 3-/5, noted functional weakness with attempts to stand LLE Sensation: decreased proprioception LLE Coordination: decreased gross motor    Cervical / Trunk Assessment Cervical / Trunk Assessment: Kyphotic Cervical / Trunk Exceptions: forward head, scapular protraction  Communication   Communication: No difficulties  Cognition Arousal/Alertness: Lethargic (intermittent) Behavior During Therapy: Flat affect Overall Cognitive Status: No family/caregiver present to determine baseline cognitive functioning Area of Impairment: Orientation, Attention, Memory, Following commands, Safety/judgement, Awareness, Problem solving                 Orientation Level: Disoriented to, Time Current Attention Level: Sustained Memory:  Decreased short-term memory Following Commands: Follows one step commands consistently, Follows one step commands with increased time, Follows multi-step commands inconsistently Safety/Judgement: Decreased awareness of safety Awareness: Emergent Problem Solving: Requires verbal cues, Requires tactile cues, Difficulty sequencing General Comments: Cues for  sequencing and safety during session. Slowed processing overall. Aware his thinking is different than it used to be but difficulty reporting what is different, and occasionally decreased insight into what will be difficult for him. Needed vc's to follow commands at times as well as to attend to session        General Comments General comments (skin integrity, edema, etc.): VSS    Exercises     Assessment/Plan    PT Assessment Patient needs continued PT services  PT Problem List Decreased strength;Decreased range of motion;Decreased activity tolerance;Decreased balance;Decreased mobility;Decreased coordination;Decreased cognition;Decreased knowledge of use of DME;Decreased safety awareness;Decreased knowledge of precautions;Pain;Decreased skin integrity       PT Treatment Interventions DME instruction;Functional mobility training;Therapeutic activities;Therapeutic exercise;Balance training;Neuromuscular re-education;Cognitive remediation;Patient/family education    PT Goals (Current goals can be found in the Care Plan section)  Acute Rehab PT Goals Patient Stated Goal: be able to get in chair without lift PT Goal Formulation: With patient Time For Goal Achievement: 01/01/23 Potential to Achieve Goals: Fair    Frequency Min 3X/week     Co-evaluation PT/OT/SLP Co-Evaluation/Treatment: Yes Reason for Co-Treatment: Complexity of the patient's impairments (multi-system involvement);Necessary to address cognition/behavior during functional activity;For patient/therapist safety PT goals addressed during session: Mobility/safety with mobility;Balance;Proper use of DME;Strengthening/ROM         AM-PAC PT "6 Clicks" Mobility  Outcome Measure Help needed turning from your back to your side while in a flat bed without using bedrails?: None Help needed moving from lying on your back to sitting on the side of a flat bed without using bedrails?: A Little Help needed moving to and from a bed  to a chair (including a wheelchair)?: Total Help needed standing up from a chair using your arms (e.g., wheelchair or bedside chair)?: Total Help needed to walk in hospital room?: Total Help needed climbing 3-5 steps with a railing? : Total 6 Click Score: 11    End of Session Equipment Utilized During Treatment: Gait belt Activity Tolerance: Patient limited by lethargy Patient left: in bed;with call bell/phone within reach;with bed alarm set Nurse Communication: Mobility status;Other (comment) (need for new condom cath) PT Visit Diagnosis: Muscle weakness (generalized) (M62.81);Pain;Other abnormalities of gait and mobility (R26.89) Pain - Right/Left: Right Pain - part of body: Leg    Time: 6503-5465 PT Time Calculation (min) (ACUTE ONLY): 39 min   Charges:   PT Evaluation $PT Eval Moderate Complexity: 1 Mod          [place] chat preferred Office [place] 12/18/2022, 12:01 PM

## 2022-12-19 LAB — BASIC METABOLIC PANEL
Anion gap: 14 (ref 5–15)
BUN: 53 mg/dL — ABNORMAL HIGH (ref 6–20)
CO2: 23 mmol/L (ref 22–32)
Calcium: 8.6 mg/dL — ABNORMAL LOW (ref 8.9–10.3)
Chloride: 98 mmol/L (ref 98–111)
Creatinine, Ser: 2.04 mg/dL — ABNORMAL HIGH (ref 0.61–1.24)
GFR, Estimated: 37 mL/min — ABNORMAL LOW (ref >60)
Glucose, Bld: 108 mg/dL — ABNORMAL HIGH (ref 70–99)
Potassium: 3.9 mmol/L (ref 3.5–5.1)
Sodium: 135 mmol/L (ref 135–145)

## 2022-12-19 LAB — CBC
HCT: 22.7 % — ABNORMAL LOW (ref 39.0–52.0)
Hemoglobin: 7.3 g/dL — ABNORMAL LOW (ref 13.0–17.0)
MCH: 30.3 pg (ref 26.0–34.0)
MCHC: 32.2 g/dL (ref 30.0–36.0)
MCV: 94.2 fL (ref 80.0–100.0)
Platelets: 308 K/uL (ref 150–400)
RBC: 2.41 MIL/uL — ABNORMAL LOW (ref 4.22–5.81)
RDW: 13.9 % (ref 11.5–15.5)
WBC: 14.6 K/uL — ABNORMAL HIGH (ref 4.0–10.5)
nRBC: 0 % (ref 0.0–0.2)

## 2022-12-19 LAB — GLUCOSE, CAPILLARY: Glucose-Capillary: 110 mg/dL — ABNORMAL HIGH (ref 70–99)

## 2022-12-19 MED ORDER — OXYCODONE-ACETAMINOPHEN 5-325 MG PO TABS: 1.0000 | ORAL_TABLET | ORAL | 0 refills | Status: DC | PRN

## 2022-12-19 NOTE — Progress Notes (Signed)
Pt. Picc line [name] flushed with blood return, dressing was not changed due to timing of discharge

## 2022-12-19 NOTE — TOC Transition Note (Addendum)
Transition of Care [facility]) - CM/SW Discharge Note   Patient Details  Name: Shane Chambers MRN: [id number] Date of Birth: 1961/05/03  Transition of Care [facility]) CM/SW Contact:  Joanne Chars, LCSW Phone Number: 12/19/2022, 10:51 AM   Clinical Narrative:   Pt discharging to [facility], room 421B.  RN call report to [phone].    Final next level of care: Skilled Nursing Facility Barriers to Discharge: Barriers Resolved   Patient Goals and CMS Choice   Choice offered to / list presented to : Patient (admitted from [facility] rehab and wants to return)  Discharge Placement                Patient chooses bed at:  [facility]) Patient to be transferred to facility by: Ray Name of family member notified: Left message with mother, Estill Bamberg  1119: Estill Bamberg called back, informed her of DC. Patient and family notified of of transfer: 12/19/22  Discharge Plan and Services Additional resources added to the After Visit Summary for   In-house Referral: Clinical Social Work   Post Acute Care Choice: [place]                               Social Determinants of Health (SDOH) Interventions SDOH Screenings   Food Insecurity: No Food Insecurity (12/17/2022)  Housing: Low Risk  (12/17/2022)  Transportation Needs: No Transportation Needs (12/17/2022)  Utilities: Not At Risk (12/17/2022)  Alcohol Screen: Low Risk  (11/05/2022)  Tobacco Use: Medium Risk (12/18/2022)     Readmission Risk Interventions    11/11/2022    2:05 PM 10/03/2022   10:08 AM 09/26/2022    1:16 PM  Readmission Risk Prevention Plan  Transportation Screening  Complete Complete  PCP or Specialist Appt within 3-5 Days   Complete  HRI or [place]   Complete  Social Work Consult for [place] Planning/Counseling   Complete  Palliative Care Screening   Not Applicable  Medication Review [employer])   Complete  HRI or [place]  Complete   SW Recovery  Care/Counseling Consult  Complete   Palliative Care Screening  Not Applicable   Skilled Nursing Facility  Complete      Information is confidential and restricted. Go to Review Flowsheets to unlock data.

## 2022-12-19 NOTE — Discharge Summary (Signed)
Discharge Diagnoses:  Principal Problem:   S/P BKA (below knee amputation) unilateral, right ([place])   Surgeries: Procedure(s): RIGHT BELOW KNEE AMPUTATION REVISION on 12/17/2022    Consultants:   Discharged Condition: Improved  Hospital Course: Shane Chambers is an 62 y.o. male who was admitted 12/17/2022 with a chief complaint of right BKA dehiscence, with a final diagnosis of Dehiscence Right Below Knee Amputation.  Patient was brought to the operating room on 12/17/2022 and underwent Procedure(s): RIGHT BELOW KNEE AMPUTATION REVISION.    Patient was given perioperative antibiotics:  Anti-infectives (From admission, onward)    Start     Dose/Rate Route Frequency Ordered Stop   12/17/22 2000  DAPTOmycin (CUBICIN) 700 mg in sodium chloride 0.9 % IVPB        700 mg 128 mL/hr over 30 Minutes Intravenous Daily 12/17/22 1234 12/20/22 1959   12/17/22 1800  ceFAZolin (ANCEF) IVPB 2g/100 mL premix        2 g 200 mL/hr over 30 Minutes Intravenous Every 8 hours 12/17/22 1136 12/18/22 0316   12/17/22 1230  daptomycin (CUBICIN) IVPB  Status:  Discontinued       Note to Pharmacy: Indication:  Osteomyelitis First Dose: Yes Last Day of Therapy:  12/19/2022   700 mg Intravenous Every 24 hours 12/17/22 1136 12/17/22 1234   12/17/22 0645  ceFAZolin (ANCEF) IVPB 2g/100 mL premix        2 g 200 mL/hr over 30 Minutes Intravenous On call to O.R. 12/17/22 [id number] 12/17/22 1007   12/17/22 [id number]  ceFAZolin (ANCEF) 2-4 GM/100ML-% IVPB       Note to Pharmacy: Humberto Leep O: cabinet override      12/17/22 0638 12/17/22 1758     .  Patient was given sequential compression devices, early ambulation, and aspirin for DVT prophylaxis.  Recent vital signs: Patient Vitals for the past 24 hrs:  BP Temp Temp src Pulse Resp SpO2  12/19/22 0739 114/60 98 F (36.7 C) Oral 67 17 94 %  12/18/22 2207 124/61 -- -- 80 -- --  12/18/22 2010 (!) 118/58 97.8 F (36.6 C) Oral 84 20 93 %  12/18/22 1358 129/61 98.9 F  (37.2 C) Oral 83 17 94 %  .  Recent laboratory studies: No results found.  Discharge Medications:   Allergies as of 12/19/2022       Reactions   Ace Inhibitors Swelling, Cough   (Not on MAR at [facility] and [place])   Haloperidol And Related    Do NOT give anti-psychotics due to risk of  Torsades and QT prolongation (per Dr. Graciela Husbands request)   Other Itching   [name]   Shellfish Allergy    Listed on MAR   Penicillins Itching, Rash   Has patient had a PCN reaction causing immediate rash, facial/tongue/throat swelling, SOB or lightheadedness with hypotension: Unknown Has patient had a PCN reaction causing severe rash involving mucus membranes or skin necrosis: Unknown Has patient had a PCN reaction that required hospitalization: Unknown Has patient had a PCN reaction occurring within the last 10 years: Unknown If all of the above answers are "NO", then may proceed with Cephalosporin use. Tolerated ancef (12-17-22)        Medication List     TAKE these medications    acetaminophen 500 MG tablet Commonly known as: TYLENOL Take 1,000 mg by mouth every 8 (eight) hours as needed for fever or moderate pain.   albuterol 108 (90 Base) MCG/ACT inhaler Commonly known as:  VENTOLIN HFA Inhale 2 puffs into the lungs every 6 (six) hours as needed for wheezing or shortness of breath.   alum & mag hydroxide-simeth 200-200-20 MG/5ML suspension Commonly known as: MAALOX/MYLANTA Take 30 mLs by mouth every 6 (six) hours as needed for indigestion or heartburn.   amLODipine 10 MG tablet Commonly known as: NORVASC Take 1 tablet (10 mg total) by mouth daily.   ascorbic acid 500 MG tablet Commonly known as: VITAMIN C Take 1 tablet (500 mg total) by mouth 2 (two) times daily.   aspirin EC 81 MG tablet Take 1 tablet (81 mg total) by mouth daily with breakfast.   atorvastatin 40 MG tablet Commonly known as: LIPITOR Take 1 tablet (40 mg total) by mouth at bedtime.    barrier cream Crea Commonly known as: non-specified Apply 1 Application topically in the morning, at noon, and at bedtime. Every shift apply to buttocks   budesonide-formoterol 160-4.5 MCG/ACT inhaler Commonly known as: Symbicort Inhale 2 puffs into the lungs in the morning and at bedtime.   Cholecalciferol 125 MCG (5000 UT) Tabs Take 1 tablet by mouth every Monday.   clotrimazole-betamethasone cream Commonly known as: LOTRISONE Apply 1 Application topically 2 (two) times daily.   daptomycin  IVPB Commonly known as: CUBICIN Inject 700 mg into the vein daily for 28 days. Indication:  Osteomyelitis First Dose: Yes Last Day of Therapy:  12/19/2022 Labs - Once weekly:  CBC/D, CMP, and CPK Labs - Every other week:  ESR and CRP Method of administration: IV Push Method of administration may be changed at the discretion of home infusion pharmacist based upon assessment of the patient and/or caregiver's ability to self-administer the medication ordered.   ferrous gluconate 324 MG tablet Commonly known as: FERGON Take 324 mg by mouth daily.   gabapentin 300 MG capsule Commonly known as: NEURONTIN Take 2 capsules (600 mg total) by mouth 2 (two) times daily.   Glucerna Liqd Take 1 Can by mouth with breakfast, with lunch, and with evening meal.   ipratropium-albuterol 0.5-2.5 (3) MG/3ML Soln Commonly known as: DUONEB Take 3 mLs by nebulization every 6 (six) hours as needed.   Latuda 20 MG Tabs tablet Generic drug: lurasidone Take 20 mg by mouth every evening.   levothyroxine 75 MCG tablet Commonly known as: SYNTHROID Take 75 mcg by mouth daily before breakfast.   magnesium oxide 400 MG tablet Commonly known as: MAG-OX Take 400 mg by mouth daily.   metFORMIN 1000 MG tablet Commonly known as: GLUCOPHAGE Take 1,000 mg by mouth 2 (two) times daily.   metoprolol tartrate 50 MG tablet Commonly known as: LOPRESSOR Take 1 tablet (50 mg total) by mouth 2 (two) times daily.    multivitamin tablet Take 1 tablet by mouth daily.   NovoLOG FlexPen 100 UNIT/ML FlexPen Generic drug: insulin aspart 0-15 Units, Subcutaneous, 3 times daily with meals CBG < 70: implement hypoglycemia protocol-call MD CBG 70 - 120: 0 units CBG 121 - 150: 2 units CBG 151 - 200: 3 units CBG 201 - 250: 5 units CBG 251 - 300: 8 units CBG 301 - 350: 11 units CBG 351 - 400: 15 units CBG > 400:   omeprazole 40 MG capsule Commonly known as: PRILOSEC Take 40 mg by mouth daily.   oxyCODONE-acetaminophen 5-325 MG tablet Commonly known as: PERCOCET/ROXICET Take 1 tablet by mouth every 4 (four) hours as needed.   polyethylene glycol 17 g packet Commonly known as: MIRALAX / GLYCOLAX Take 17 g by mouth daily  as needed for moderate constipation.   polyvinyl alcohol 1.4 % ophthalmic solution Commonly known as: LIQUIFILM TEARS Place 1 drop into both eyes daily.   potassium chloride SA 20 MEQ tablet Commonly known as: KLOR-CON M Take 1 tablet (20 mEq total) by mouth daily.   senna-docusate 8.6-50 MG tablet Commonly known as: Senokot-S Take 1 tablet by mouth 2 (two) times daily.   tamsulosin 0.4 MG Caps capsule Commonly known as: FLOMAX Take 0.8 mg by mouth every evening.   thiamine 100 MG tablet Commonly known as: Vitamin B-1 Take 1 tablet (100 mg total) by mouth daily.   torsemide 20 MG tablet Commonly known as: DEMADEX Take 2 tablets (40 mg total) by mouth daily.   venlafaxine XR 150 MG 24 hr capsule Commonly known as: EFFEXOR-XR Take 1 capsule (150 mg total) by mouth daily with breakfast.   Vitamin D (Ergocalciferol) 1.25 MG (50000 UNIT) Caps capsule Commonly known as: DRISDOL Take 1 capsule (50,000 Units total) by mouth every 7 (seven) days.        Diagnostic Studies: CT ABDOMEN PELVIS WO CONTRAST  Result Date: 12/10/2022 CLINICAL DATA:  Abdominal pain, constipation, on antibiotics for osteomyelitis EXAM: CT ABDOMEN AND PELVIS WITHOUT CONTRAST TECHNIQUE:  Multidetector CT imaging of the abdomen and pelvis was performed following the standard protocol without IV contrast. RADIATION DOSE REDUCTION: This exam was performed according to the departmental dose-optimization program which includes automated exposure control, adjustment of the mA and/or kV according to patient size and/or use of iterative reconstruction technique. COMPARISON:  06/10/2022 FINDINGS: Lower chest: Linear scarring/atelectasis in the lingula. Hypodense blood pool relative to myocardium, suggesting anemia. Hepatobiliary: Unenhanced liver is unremarkable. Layering tiny gallstones (series 2/image 28), without associated inflammatory changes. No intrahepatic or extrahepatic duct dilatation. Pancreas: Within normal limits. Spleen: Calcified splenic granulomata. Adrenals/Urinary Tract: Adrenal glands are within normal limits. Kidneys are normal limits. No renal, ureteral, or bladder calculi. No hydronephrosis. Bladder is within normal limits. Stomach/Bowel: Stomach is within normal limits. No evidence of bowel obstruction. Appendix is not discretely visualized. Moderate colonic stool burden, most prominent in the rectosigmoid colon, suggesting constipation. Vascular/Lymphatic: No evidence of abdominal aortic aneurysm. Atherosclerotic calcifications of the abdominal aorta and branch vessels. No suspicious abdominopelvic lymphadenopathy. Reproductive: Prostate is unremarkable. Other: No abdominopelvic ascites. Musculoskeletal: Moderate degenerative changes of the visualized thoracolumbar spine. Median sternotomy. IMPRESSION: Moderate colonic stool burden, suggesting constipation. Cholelithiasis, without associated inflammatory changes. Additional ancillary findings as above. Electronically Signed   By: Julian Hy M.D.   On: 12/10/2022 00:39   DG Abdomen 1 View  Result Date: 12/10/2022 CLINICAL DATA:  Generalized abdominal pain for several days EXAM: ABDOMEN - 1 VIEW COMPARISON:  11/21/2022  FINDINGS: Scattered large and small bowel gas is noted. There is significant distension of the transverse colon identified. This may represent a colonic ileus although may be related to retained fecal material within the more distal colon. Correlate with physical exam. No free air is seen. IMPRESSION: Gaseous distension of the transverse colon likely related to significant retained fecal material within the left colon. Electronically Signed   By: Inez Catalina M.D.   On: 12/10/2022 00:03   [place] EKG SITE RITE  Result Date: 11/28/2022 If Site Rite image not attached, placement could not be confirmed due to current cardiac rhythm.  US Venous Img Lower Unilateral Left (DVT)  Result Date: 11/27/2022 CLINICAL DATA:  Left ankle edema. EXAM: LEFT LOWER EXTREMITY VENOUS DOPPLER ULTRASOUND TECHNIQUE: Gray-scale sonography with graded compression, as well as color  Doppler and duplex ultrasound were performed to evaluate the lower extremity deep venous systems from the level of the common femoral vein and including the common femoral, femoral, profunda femoral, popliteal and calf veins including the posterior tibial, peroneal and gastrocnemius veins when visible. The superficial great saphenous vein was also interrogated. Spectral Doppler was utilized to evaluate flow at rest and with distal augmentation maneuvers in the common femoral, femoral and popliteal veins. COMPARISON:  None Available. FINDINGS: Contralateral Common Femoral Vein: Respiratory phasicity is normal and symmetric with the symptomatic side. No evidence of thrombus. Normal compressibility. Common Femoral Vein: No evidence of thrombus. Normal compressibility, respiratory phasicity and response to augmentation. Saphenofemoral Junction: No evidence of thrombus. Normal compressibility and flow on color Doppler imaging. Profunda Femoral Vein: No evidence of thrombus. Normal compressibility and flow on color Doppler imaging. Femoral Vein: No evidence of  thrombus. Normal compressibility, respiratory phasicity and response to augmentation. Popliteal Vein: No evidence of thrombus. Normal compressibility, respiratory phasicity and response to augmentation. Calf Veins: No evidence of thrombus. Normal compressibility and flow on color Doppler imaging. Superficial Great Saphenous Vein: No evidence of thrombus. Normal compressibility. Venous Reflux:  None. Other Findings: No evidence of superficial thrombophlebitis or abnormal fluid collection. IMPRESSION: No evidence of left lower extremity deep venous thrombosis. Electronically Signed   By: Aletta Edouard M.D.   On: 11/27/2022 15:47   DG Chest Port 1 View  Result Date: 11/25/2022 CLINICAL DATA:  Shortness of breath. EXAM: PORTABLE CHEST 1 VIEW COMPARISON:  10/12/2022 FINDINGS: Previous median sternotomy. Interval removal of feeding tube. Stable cardiac enlargement. Unchanged asymmetric elevation of the right hemidiaphragm. Mild diffuse interstitial edema noted. No definite pleural effusion or airspace disease. IMPRESSION: 1. Mild diffuse interstitial edema. 2. Interval removal of feeding tube. Electronically Signed   By: Kerby Moors M.D.   On: 11/25/2022 10:06   DG Abd 1 View  Result Date: 11/21/2022 CLINICAL DATA:  Lower abdominal pain. Constipation. Concern for impaction. EXAM: ABDOMEN - 1 VIEW COMPARISON:  One-view abdomen 10/10/2022 FINDINGS: The heart is enlarged. Mild airspace opacity at the right base likely reflects atelectasis. Moderate stool is present throughout the colon. The rectum is not dilated. Small bowel gas pattern is within normal limits. Vascular calcifications are noted. IMPRESSION: 1. Moderate stool throughout the colon without evidence for obstruction. 2. Cardiomegaly without failure. Electronically Signed   By: San Morelle M.D.   On: 11/21/2022 17:24    Patient benefited maximally from their hospital stay and there were no complications.     Disposition: Discharge  disposition: [facility]-Rehab Facility      Discharge Instructions     Call MD / Call 911   Complete by: As directed    If you experience chest pain or shortness of breath, CALL 911 and be transported to the hospital emergency room.  If you develope a fever above 101 F, pus (white drainage) or increased drainage or redness at the wound, or calf pain, call your surgeon's office.   Constipation Prevention   Complete by: As directed    Drink plenty of fluids.  Prune juice may be helpful.  You may use a stool softener, such as Colace (over the counter) 100 mg twice a day.  Use MiraLax (over the counter) for constipation as needed.   Diet - low sodium heart healthy   Complete by: As directed    Increase activity slowly as tolerated   Complete by: As directed    Negative Pressure Wound Therapy - Incisional  Complete by: As directed    Attached the wound VAC dressing to the Praveena plus portable wound VAC pump at discharge.   Negative Pressure Wound Therapy - Incisional   Complete by: As directed    Attach vac dressing to prevena pump   Post-operative opioid taper instructions:   Complete by: As directed    POST-OPERATIVE OPIOID TAPER INSTRUCTIONS: It is important to wean off of your opioid medication as soon as possible. If you do not need pain medication after your surgery it is ok to stop day one. Opioids include: Codeine, Hydrocodone(Norco, Vicodin), Oxycodone(Percocet, oxycontin) and hydromorphone amongst others.  Long term and even short term use of opiods can cause: Increased pain response Dependence Constipation Depression Respiratory depression And more.  Withdrawal symptoms can include Flu like symptoms Nausea, vomiting And more Techniques to manage these symptoms Hydrate well Eat regular healthy meals Stay active Use relaxation techniques(deep breathing, meditating, yoga) Do Not substitute Alcohol to help with tapering If you have been on opioids for less than two weeks  and do not have pain than it is ok to stop all together.  Plan to wean off of opioids This plan should start within one week post op of your joint replacement. Maintain the same interval or time between taking each dose and first decrease the dose.  Cut the total daily intake of opioids by one tablet each day Next start to increase the time between doses. The last dose that should be eliminated is the evening dose.          Follow-up Information     Newt Minion, MD Follow up in 1 week(s).   Specialty: Orthopedic Surgery Contact information: [address] [place] [address] [phone]                  Signed: Newt Minion 12/19/2022, 10:05 AM

## 2022-12-19 NOTE — TOC Progression Note (Addendum)
Transition of Care [facility]) - Progression Note    Patient Details  Name: SONYA GIM MRN: [id number] Date of Birth: 1960-11-21  [facility] CM/SW Contact  Joanne Chars, LCSW Phone Number: 12/19/2022, 9:42 AM  Clinical Narrative:   CSW message from [facility].  Pt has used all his rehab days, has not had 60 day period of wellness to reset them. Does not need to be in hospital 3 midnights for them.  They can accept over the weekend if ready.  MD informed.    Dr Sharol Given plan to DC today.    CSW confirmed with [facility] they can accept today.      Expected Discharge Plan: [place] Barriers to Discharge: Continued Medical Work up  Expected Discharge Plan and Services In-house Referral: Clinical Social Work   Post Acute Care Choice: [place] Living arrangements for the past 2 months: [place]                                       Social Determinants of Health (SDOH) Interventions SDOH Screenings   Food Insecurity: No Food Insecurity (12/17/2022)  Housing: Low Risk  (12/17/2022)  Transportation Needs: No Transportation Needs (12/17/2022)  Utilities: Not At Risk (12/17/2022)  Alcohol Screen: Low Risk  (11/05/2022)  Tobacco Use: Medium Risk (12/18/2022)    Readmission Risk Interventions    11/11/2022    2:05 PM 10/03/2022   10:08 AM 09/26/2022    1:16 PM  Readmission Risk Prevention Plan  Transportation Screening  Complete Complete  PCP or Specialist Appt within 3-5 Days   Complete  HRI or [place]   Complete  Social Work Consult for [place] Planning/Counseling   Complete  Palliative Care Screening   Not Applicable  Medication Review [employer])   Complete  HRI or [place]  Complete   SW Recovery Care/Counseling Consult  Complete   [place]  Complete      Information is confidential and  restricted. Go to Review Flowsheets to unlock data.

## 2022-12-19 NOTE — Plan of Care (Signed)
  Problem: Education: Goal: Ability to describe self-care measures that may prevent or decrease complications (Diabetes Survival Skills Education) will improve Outcome: Adequate for Discharge Goal: Individualized Educational Video(s) Outcome: Adequate for Discharge   Problem: Coping: Goal: Ability to adjust to condition or change in health will improve Outcome: Adequate for Discharge   Problem: Fluid Volume: Goal: Ability to maintain a balanced intake and output will improve Outcome: Adequate for Discharge   Problem: Health Behavior/Discharge Planning: Goal: Ability to identify and utilize available resources and services will improve Outcome: Adequate for Discharge Goal: Ability to manage health-related needs will improve Outcome: Adequate for Discharge   Problem: Metabolic: Goal: Ability to maintain appropriate glucose levels will improve Outcome: Adequate for Discharge   Problem: Nutritional: Goal: Maintenance of adequate nutrition will improve Outcome: Adequate for Discharge Goal: Progress toward achieving an optimal weight will improve Outcome: Adequate for Discharge   Problem: Skin Integrity: Goal: Risk for impaired skin integrity will decrease Outcome: Adequate for Discharge   Problem: Tissue Perfusion: Goal: Adequacy of tissue perfusion will improve Outcome: Adequate for Discharge   Problem: Education: Goal: Knowledge of the prescribed therapeutic regimen will improve Outcome: Adequate for Discharge Goal: Ability to verbalize activity precautions or restrictions will improve Outcome: Adequate for Discharge Goal: Understanding of discharge needs will improve Outcome: Adequate for Discharge   Problem: Activity: Goal: Ability to perform//tolerate increased activity and mobilize with assistive devices will improve Outcome: Adequate for Discharge   Problem: Clinical Measurements: Goal: Postoperative complications will be avoided or minimized Outcome: Adequate  for Discharge   Problem: Self-Care: Goal: Ability to meet self-care needs will improve Outcome: Adequate for Discharge   Problem: Self-Concept: Goal: Ability to maintain and perform role responsibilities to the fullest extent possible will improve Outcome: Adequate for Discharge   Problem: Pain Management: Goal: Pain level will decrease with appropriate interventions Outcome: Adequate for Discharge   Problem: Skin Integrity: Goal: Demonstration of wound healing without infection will improve Outcome: Adequate for Discharge   Problem: Education: Goal: Knowledge of General Education information will improve Description: Including pain rating scale, medication(s)/side effects and non-pharmacologic comfort measures Outcome: Adequate for Discharge   Problem: Health Behavior/Discharge Planning: Goal: Ability to manage health-related needs will improve Outcome: Adequate for Discharge   Problem: Clinical Measurements: Goal: Ability to maintain clinical measurements within normal limits will improve Outcome: Adequate for Discharge Goal: Will remain free from infection Outcome: Adequate for Discharge Goal: Diagnostic test results will improve Outcome: Adequate for Discharge Goal: Respiratory complications will improve Outcome: Adequate for Discharge Goal: Cardiovascular complication will be avoided Outcome: Adequate for Discharge   Problem: Activity: Goal: Risk for activity intolerance will decrease Outcome: Adequate for Discharge   Problem: Nutrition: Goal: Adequate nutrition will be maintained Outcome: Adequate for Discharge   Problem: Coping: Goal: Level of anxiety will decrease Outcome: Adequate for Discharge   Problem: Elimination: Goal: Will not experience complications related to bowel motility Outcome: Adequate for Discharge Goal: Will not experience complications related to urinary retention Outcome: Adequate for Discharge   Problem: Pain Managment: Goal:  General experience of comfort will improve Outcome: Adequate for Discharge   Problem: Safety: Goal: Ability to remain free from injury will improve Outcome: Adequate for Discharge   Problem: Skin Integrity: Goal: Risk for impaired skin integrity will decrease Outcome: Adequate for Discharge

## 2022-12-22 ENCOUNTER — Telehealth: Payer: Self-pay | Admitting: Orthopedic Surgery

## 2022-12-22 NOTE — Telephone Encounter (Signed)
Appt on the sch 12/29/2022 was not able to come in on Friday.

## 2022-12-22 NOTE — Telephone Encounter (Signed)
[facility], please call for follow up appt 1 week follow up post op REVISION RIGHT BELOW KNEE AMPUTATION 12/17/22 none of what I have works for them transportation wise on [place] or [place] schedule

## 2022-12-24 ENCOUNTER — Encounter: Payer: Self-pay | Admitting: *Deleted

## 2022-12-24 ENCOUNTER — Inpatient Hospital Stay: Payer: Medicare Other

## 2022-12-24 ENCOUNTER — Ambulatory Visit (HOSPITAL_COMMUNITY): Payer: Medicare Other

## 2022-12-25 ENCOUNTER — Ambulatory Visit: Payer: Medicare Other | Attending: Infectious Diseases | Admitting: Infectious Diseases

## 2022-12-25 DIAGNOSIS — T8743 Infection of amputation stump, right lower extremity: Secondary | ICD-10-CM | POA: Diagnosis present

## 2022-12-25 DIAGNOSIS — Z89511 Acquired absence of right leg below knee: Secondary | ICD-10-CM | POA: Insufficient documentation

## 2022-12-25 DIAGNOSIS — Y835 Amputation of limb(s) as the cause of abnormal reaction of the patient, or of later complication, without mention of misadventure at the time of the procedure: Secondary | ICD-10-CM | POA: Diagnosis not present

## 2022-12-25 NOTE — Progress Notes (Signed)
The purpose of this virtual visit is to provide medical care while limiting exposure to the novel coronavirus (COVID19) for both patient and office staff.   Consent was obtained for phone visit:  Yes.   Answered questions that patient had about telehealth interaction:  Yes.   I discussed the limitations, risks, security and privacy concerns of performing an evaluation and management service by telephone. I also discussed with the patient that there may be a patient responsible charge related to this service. The patient expressed understanding and agreed to proceed.   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

## 2022-12-29 ENCOUNTER — Ambulatory Visit (INDEPENDENT_AMBULATORY_CARE_PROVIDER_SITE_OTHER): Payer: Medicare Other | Admitting: Orthopedic Surgery

## 2022-12-29 DIAGNOSIS — T8781 Dehiscence of amputation stump: Secondary | ICD-10-CM

## 2022-12-29 DIAGNOSIS — Z89511 Acquired absence of right leg below knee: Secondary | ICD-10-CM

## 2022-12-31 ENCOUNTER — Ambulatory Visit: Payer: Medicare Other | Admitting: Hematology

## 2023-01-06 ENCOUNTER — Ambulatory Visit: Payer: Medicare Other | Admitting: Gastroenterology

## 2023-01-06 NOTE — Progress Notes (Deleted)
Rectal cancer:  First noted at screening colonoscopy July 18, 2021.  2 polyps removed from the sigmoid colon one measuring 15 mm and one measuring 5 mm placed in the same container.  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  No lymphovascular invasion.  No poorly differentiated component.  Follow-up flexible sigmoidoscopy in October showing ulcerated site at prior polypectomy noted in the rectum, 5 to 8 cm from the anal verge.  No additional tissue obtained.  No report of tattooing site.  Patient has subsequently been followed by Dr. Delton Coombes as outlined above.  Recently seen colorectal surgeon, Dr. Leighton Ruff.  After reviewing slides with pathologist, she feels like cancer was superficial and that patient can be followed endoscopically.  She is requesting site to be tattooed for assist with ongoing surveillance.     Flex sig March 2023 Preparation of the colon was fair.                           - Post-polypectomy scar in the rectum. Biopsied.                            Tattooed. Benign path. Surveillance colonoscopy due.

## 2023-01-07 ENCOUNTER — Encounter: Payer: Self-pay | Admitting: Orthopedic Surgery

## 2023-01-07 NOTE — Progress Notes (Signed)
Office Visit Note   Patient: Shane Chambers           Date of Birth: 09-24-1961           MRN: [id number] Visit Date: 12/29/2022              Requested by: Caprice Renshaw, MD [address] [place],  [place] [address] PCP: Caprice Renshaw, MD  Chief Complaint  Patient presents with   Right Leg - Routine Post Op    12/17/2022 right BKA revision 38 micro kerecis       HPI: Patient is a 62 year old gentleman who presents 2 weeks status post revision right below-knee amputation.  Assessment & Plan: Visit Diagnoses:  1. Right below-knee amputee ([place])   2. Dehiscence of amputation stump of right lower extremity (HCC)     Plan: Start Dial soap cleansing dry dressing change plus a shrinker.  Follow-Up Instructions: Return in about 2 weeks (around 01/12/2023).   Ortho Exam  Patient is alert, oriented, no adenopathy, well-dressed, normal affect, normal respiratory effort. Examination the residual limb is well-approximated.  There is a small amount of clear serosanguineous drainage no cellulitis no signs of infection.  Imaging: No results found.    Labs: Lab Results  Component Value Date   HGBA1C 6.1 (H) 11/07/2022   HGBA1C 5.6 09/26/2022   HGBA1C 11.1 (H) 11/23/2020   ESRSEDRATE 115 (H) 11/21/2022   ESRSEDRATE 81 (H) 11/10/2022   ESRSEDRATE >140 (H) 10/02/2022   CRP 0.7 11/21/2022   CRP 4.7 (H) 11/10/2022   CRP 33.7 (H) 10/02/2022   REPTSTATUS 11/19/2022 FINAL 11/14/2022   GRAMSTAIN  11/14/2022    RARE WBC PRESENT, PREDOMINANTLY PMN NO ORGANISMS SEEN    CULT  11/14/2022    RARE METHICILLIN RESISTANT STAPHYLOCOCCUS AUREUS NO ANAEROBES ISOLATED Performed at [facility], [place] [address], [place], [place] [address]    LABORGA METHICILLIN RESISTANT STAPHYLOCOCCUS AUREUS 11/14/2022     Lab Results  Component Value Date   ALBUMIN 3.5 12/09/2022   ALBUMIN 2.9 (L) 11/11/2022   ALBUMIN 3.0 (L) 11/10/2022    Lab Results  Component Value Date   MG 2.4 11/27/2022   MG  2.2 11/26/2022   MG 2.4 11/25/2022   Lab Results  Component Value Date   VD25OH 28.25 (L) 11/11/2022   VD25OH 8.61 (L) 12/01/2019    No results found for: "PREALBUMIN"    Latest Ref Rng & Units 12/19/2022    3:08 AM 12/18/2022    2:23 AM 12/09/2022   11:37 PM  CBC EXTENDED  WBC 4.0 - 10.5 K/uL 14.6  14.3  14.0   RBC 4.22 - 5.81 MIL/uL 2.41  2.52  3.24   Hemoglobin 13.0 - 17.0 g/dL 7.3  7.7  9.5   HCT 39.0 - 52.0 % 22.7  23.5  30.2   Platelets 150 - 400 K/uL 308  342  393   NEUT# 1.7 - 7.7 K/uL   11.2   Lymph# 0.7 - 4.0 K/uL   1.1      There is no height or weight on file to calculate BMI.  Orders:  No orders of the defined types were placed in this encounter.  No orders of the defined types were placed in this encounter.    Procedures: No procedures performed  Clinical Data: No additional findings.  ROS:  All other systems negative, except as noted in the HPI. Review of Systems  Objective: Vital Signs: There were no vitals taken for this  visit.  Specialty Comments:  No specialty comments available.  PMFS History: Patient Active Problem List   Diagnosis Date Noted   S/P BKA (below knee amputation) unilateral, right ([place]) 12/17/2022   MRSA infection 11/19/2022   Amputation stump infection ([place]) 11/19/2022   QT prolongation 11/10/2022   Severe recurrent major depression without psychotic features ([place]) 11/05/2022   Major depressive disorder with psychotic features ([place]) 11/02/2022   Acute hypoactive delirium due to another medical condition 10/13/2022   Septic arthritis of right ankle ([place]) 10/04/2022   Acute osteomyelitis ([place]) 10/02/2022   Subacute osteomyelitis, right ankle and foot ([place]) 10/02/2022   Hypothyroidism 10/02/2022   Hypoalbuminemia 10/02/2022   Cellulitis of right lower extremity 09/25/2022   Lactic acidosis 04/23/2022   Anemia of chronic disease 04/23/2022   Obesity, Class III, BMI 40-49.9 (morbid obesity) ([place]) 04/23/2022   AKI (acute  kidney injury) ([place]) 04/22/2022   Elevated troponin 04/22/2022   Cancer of sigmoid colon ([place]) 12/16/2021   Constipation 12/16/2021   GERD (gastroesophageal reflux disease) 12/16/2021   Iron deficiency anemia 09/17/2021   Rectal cancer ([place]) 09/03/2021   Bilateral lower leg cellulitis [id number]   Systolic and diastolic CHF, chronic ([place]) 11/22/2020   CHF exacerbation ([place]) 08/01/2020   Visit for wound check 05/28/2020   OSA (obstructive sleep apnea) 05/24/2020   Acute exacerbation of chronic obstructive pulmonary disease (COPD) ([place]) 04/29/2020   Leukocytosis 04/29/2020   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   Ischemic cardiomyopathy    Coronary artery disease    Acute on chronic combined systolic and diastolic CHF (congestive heart failure) ([place]) 03/05/2020   Acute hyponatremia 03/05/2020   Severe Vitamin D deficiency 12/02/2019   Hypokalemia 12/01/2019   Hyperlipidemia 12/01/2019   BPH (benign prostatic hyperplasia) 12/01/2019   Normocytic anemia 12/01/2019   Pneumonia due to COVID-19 virus 12/01/2019   Hypoxia    SOB (shortness of breath) 11/16/2019   Severe sepsis ([place]) 11/16/2019   Hypertension    Type 2 diabetes mellitus ([place])    Depression    Acute bronchitis with bronchospasm 11/15/2019   Past Medical History:  Diagnosis Date   Anemia    Arthritis    CAD (coronary artery disease)    a. s/p CABG in 03/2020 with LIMA-LAD, RIMA-PL, RA-D1-RI-OM1   Cancer (HCC)    rectal   Cellulitis    COPD (chronic obstructive pulmonary disease) ([place])    Depression    Diabetes mellitus without complication ([place])    GERD (gastroesophageal reflux disease)    History of kidney stones    Hyperlipidemia 12/01/2019   Hypertension    Hypothyroidism    Ischemic cardiomyopathy    a. EF 20-25% by echo in 02/2020 b. at 40% by echo in 03/2020 c. EF normalized to 60-65% by echo in 04/2020   Myocardial infarction [facility])    Peripheral vascular disease ([place])    Sleep apnea    Type 2 diabetes mellitus  ([place])     Family History  Problem Relation Age of Onset   Hypertension Mother     Past Surgical History:  Procedure Laterality Date   AMPUTATION Right 10/05/2022   Procedure: AMPUTATION BELOW KNEE;  Surgeon: Newt Minion, MD;  Location: [place];  Service: Orthopedics;  Laterality: Right;   AMPUTATION Right 11/14/2022   Procedure: AMPUTATION BELOW KNEE REVISION; WASHOUT;  Surgeon: Katha Cabal, MD;  Location: [facility];  Service: Vascular;  Laterality: Right;   AMPUTATION Right 11/18/2022   Procedure: AMPUTATION BELOW KNEE REVISION AND  CLOSURE;  Surgeon: Katha Cabal, MD;  Location: [facility];  Service: Vascular;  Laterality: Right;   APPLICATION OF WOUND VAC Right 10/05/2022   Procedure: APPLICATION OF WOUND VAC;  Surgeon: Newt Minion, MD;  Location: [place];  Service: Orthopedics;  Laterality: Right;   BIOPSY  07/18/2021   Procedure: BIOPSY;  Surgeon: Eloise Harman, DO;  [facility];  Service: Endoscopy;;   BIOPSY  01/09/2022   Procedure: BIOPSY;  Surgeon: Eloise Harman, DO;  [facility];  Service: Endoscopy;;   COLONOSCOPY WITH PROPOFOL N/A 07/18/2021   Carver: 15 millimeter polyp removed from the sigmoid colon, 5 mm polyp removed from sigmoid colon.  Nonbleeding internal hemorrhoids.  Significant looping of the colon. sigmoid path showed invasive colonic adenocarcinoma involving tubular adenoma (invades to depth of 68m, carcinoma 119mfrom margin, no lymphovascular invasion, no poorly differentiated component.   CORONARY ARTERY BYPASS GRAFT N/A 03/13/2020   Procedure: CORONARY ARTERY BYPASS GRAFTING (CABG) times five using bilateral Internal mammary arteries and left radial artery;  Surgeon: AtWonda OldsMD;  [facility] Service: Open Heart Surgery;  Laterality: N/A;   DENTAL SURGERY     ESOPHAGOGASTRODUODENOSCOPY (EGD) WITH PROPOFOL N/A 07/18/2021   Carver: 1 gastric polyp status post biopsy, gastritis. gastric bx with slight chronic  inflammation and no H.pyori. GEJ polypectomy with mild inflammation only   FLEXIBLE SIGMOIDOSCOPY N/A 08/26/2021   Carver: Nonbleeding internal hemorrhoids.  15 mm ulcers from previous polypectomy found in the rectum.  No evidence of previous polyp.  Located 5 to 8 cm from anal verge.   FLEXIBLE SIGMOIDOSCOPY N/A 01/09/2022   Procedure: FLEXIBLE SIGMOIDOSCOPY;  Surgeon: CaEloise HarmanDO;  [facility];  Service: Endoscopy;  Laterality: N/A;   POLYPECTOMY  07/18/2021   Procedure: POLYPECTOMY INTESTINAL;  Surgeon: CaEloise HarmanDO;  [facility];  Service: Endoscopy;;   RADIAL ARTERY HARVEST Left 03/13/2020   Procedure: RAE[place]  Surgeon: AtWonda OldsMD;  [facility] Service: Open [facility] Surgery;  Laterality: Left;   RIGHT/LEFT HEART CATH AND CORONARY ANGIOGRAPHY N/A 03/07/2020   Procedure: RIGHT/LEFT HEART CATH AND CORONARY ANGIOGRAPHY;  Surgeon: CoSherren MochaMD;  [facility];  Service: Cardiovascular;  Laterality: N/A;   STUMP REVISION Right 12/17/2022   Procedure: RIGHT BELOW KNEE AMPUTATION REVISION;  Surgeon: DuNewt MinionMD;  [facility]: [facility] Service: Orthopedics;  Laterality: Right;   SUBMUCOSAL LIFTING INJECTION  01/09/2022   Procedure: SUBMUCOSAL LIFTING INJECTION;  Surgeon: CaEloise HarmanDO;  [facility];  Service: Endoscopy;;   SUBMUCOSAL TATTOO INJECTION  01/09/2022   Procedure: SUBMUCOSAL TATTOO INJECTION;  Surgeon: CaEloise HarmanDO;  [facility];  Service: Endoscopy;;   TEE WITHOUT CARDIOVERSION N/A 03/13/2020   Procedure: TRANSESOPHAGEAL ECHOCARDIOGRAM (TEE);  Surgeon: AtWonda OldsMD;  [facility] Service: Open Heart Surgery;  Laterality: N/A;   Social History   Occupational History   Not on file  Tobacco Use   Smoking status: Former    Packs/day: 1.50    Types: Cigarettes    Quit date: 05/25/1995    Years since quitting: 27.6   Smokeless tobacco: Never   Vaping Use   Vaping Use: Never used  Substance and Sexual Activity   Alcohol use: Not Currently    Comment: rarely   Drug use: No   Sexual activity: Not on file

## 2023-01-19 ENCOUNTER — Encounter: Payer: Medicare Other | Admitting: Orthopedic Surgery

## 2023-01-19 ENCOUNTER — Ambulatory Visit (INDEPENDENT_AMBULATORY_CARE_PROVIDER_SITE_OTHER): Payer: Medicare Other | Admitting: Orthopedic Surgery

## 2023-01-19 DIAGNOSIS — Z89511 Acquired absence of right leg below knee: Secondary | ICD-10-CM

## 2023-01-21 ENCOUNTER — Encounter: Payer: Self-pay | Admitting: Orthopedic Surgery

## 2023-01-21 NOTE — Progress Notes (Signed)
Office Visit Note   Patient: Shane Chambers           Date of Birth: 09-24-1961           MRN: [id number] Visit Date: 01/19/2023              Requested by: Caprice Renshaw, MD [address] [place],  [place] [address] PCP: Caprice Renshaw, MD  Chief Complaint  Patient presents with   Right Leg - Routine Post Op    12/17/2022 right BKA revision 38 micro kerecis      HPI: Patient is a 62 year old gentleman who is 5 weeks status post right below-knee amputation revision with Kerecis tissue graft.  Assessment & Plan: Visit Diagnoses:  1. Right below-knee amputee [facility])     Plan: Patient will continue with compression and follow-up with [facility] for prosthetic fitting  Follow-Up Instructions: Return in about 4 weeks (around 02/16/2023).   Ortho Exam  Patient is alert, oriented, no adenopathy, well-dressed, normal affect, normal respiratory effort. Examination the incision is well-healed there is no cellulitis or drainage there is decreased swelling sutures are removed.  Imaging: No results found. No images are attached to the encounter.  Labs: Lab Results  Component Value Date   HGBA1C 6.1 (H) 11/07/2022   HGBA1C 5.6 09/26/2022   HGBA1C 11.1 (H) 11/23/2020   ESRSEDRATE 115 (H) 11/21/2022   ESRSEDRATE 81 (H) 11/10/2022   ESRSEDRATE >140 (H) 10/02/2022   CRP 0.7 11/21/2022   CRP 4.7 (H) 11/10/2022   CRP 33.7 (H) 10/02/2022   REPTSTATUS 11/19/2022 FINAL 11/14/2022   GRAMSTAIN  11/14/2022    RARE WBC PRESENT, PREDOMINANTLY PMN NO ORGANISMS SEEN    CULT  11/14/2022    RARE METHICILLIN RESISTANT STAPHYLOCOCCUS AUREUS NO ANAEROBES ISOLATED Performed at [facility], [place] [address] [place], [place] [address]    LABORGA METHICILLIN RESISTANT STAPHYLOCOCCUS AUREUS 11/14/2022     Lab Results  Component Value Date   ALBUMIN 3.5 12/09/2022   ALBUMIN 2.9 (L) 11/11/2022   ALBUMIN 3.0 (L) 11/10/2022    Lab Results  Component Value Date   MG 2.4 11/27/2022   MG 2.2  11/26/2022   MG 2.4 11/25/2022   Lab Results  Component Value Date   VD25OH 28.25 (L) 11/11/2022   VD25OH 8.61 (L) 12/01/2019    No results found for: "PREALBUMIN"    Latest Ref Rng & Units 12/19/2022    3:08 AM 12/18/2022    2:23 AM 12/09/2022   11:37 PM  CBC EXTENDED  WBC 4.0 - 10.5 K/uL 14.6  14.3  14.0   RBC 4.22 - 5.81 MIL/uL 2.41  2.52  3.24   Hemoglobin 13.0 - 17.0 g/dL 7.3  7.7  9.5   HCT 39.0 - 52.0 % 22.7  23.5  30.2   Platelets 150 - 400 K/uL 308  342  393   NEUT# 1.7 - 7.7 K/uL   11.2   Lymph# 0.7 - 4.0 K/uL   1.1      There is no height or weight on file to calculate BMI.  Orders:  No orders of the defined types were placed in this encounter.  No orders of the defined types were placed in this encounter.    Procedures: No procedures performed  Clinical Data: No additional findings.  ROS:  All other systems negative, except as noted in the HPI. Review of Systems  Objective: Vital Signs: There were no vitals taken for this visit.  Specialty Comments:  No  specialty comments available.  PMFS History: Patient Active Problem List   Diagnosis Date Noted   S/P BKA (below knee amputation) unilateral, right ([place]) 12/17/2022   MRSA infection 11/19/2022   Amputation stump infection ([place]) 11/19/2022   QT prolongation 11/10/2022   Severe recurrent major depression without psychotic features ([place]) 11/05/2022   Major depressive disorder with psychotic features ([place]) 11/02/2022   Acute hypoactive delirium due to another medical condition 10/13/2022   Septic arthritis of right ankle ([place]) 10/04/2022   Acute osteomyelitis ([place]) 10/02/2022   Subacute osteomyelitis, right ankle and foot ([place]) 10/02/2022   Hypothyroidism 10/02/2022   Hypoalbuminemia 10/02/2022   Cellulitis of right lower extremity 09/25/2022   Lactic acidosis 04/23/2022   Anemia of chronic disease 04/23/2022   Obesity, Class III, BMI 40-49.9 (morbid obesity) ([place]) 04/23/2022   AKI (acute kidney  injury) ([place]) 04/22/2022   Elevated troponin 04/22/2022   Cancer of sigmoid colon ([place]) 12/16/2021   Constipation 12/16/2021   GERD (gastroesophageal reflux disease) 12/16/2021   Iron deficiency anemia 09/17/2021   Rectal cancer ([place]) 09/03/2021   Bilateral lower leg cellulitis [id number]   Systolic and diastolic CHF, chronic ([place]) 11/22/2020   CHF exacerbation ([place]) 08/01/2020   Visit for wound check 05/28/2020   OSA (obstructive sleep apnea) 05/24/2020   Acute exacerbation of chronic obstructive pulmonary disease (COPD) ([place]) 04/29/2020   Leukocytosis 04/29/2020   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   Ischemic cardiomyopathy    Coronary artery disease    Acute on chronic combined systolic and diastolic CHF (congestive heart failure) ([place]) 03/05/2020   Acute hyponatremia 03/05/2020   Severe Vitamin D deficiency 12/02/2019   Hypokalemia 12/01/2019   Hyperlipidemia 12/01/2019   BPH (benign prostatic hyperplasia) 12/01/2019   Normocytic anemia 12/01/2019   Pneumonia due to COVID-19 virus 12/01/2019   Hypoxia    SOB (shortness of breath) 11/16/2019   Severe sepsis ([place]) 11/16/2019   Hypertension    Type 2 diabetes mellitus ([place])    Depression    Acute bronchitis with bronchospasm 11/15/2019   Past Medical History:  Diagnosis Date   Anemia    Arthritis    CAD (coronary artery disease)    a. s/p CABG in 03/2020 with LIMA-LAD, RIMA-PL, RA-D1-RI-OM1   Cancer (HCC)    rectal   Cellulitis    COPD (chronic obstructive pulmonary disease) ([place])    Depression    Diabetes mellitus without complication ([place])    GERD (gastroesophageal reflux disease)    History of kidney stones    Hyperlipidemia 12/01/2019   Hypertension    Hypothyroidism    Ischemic cardiomyopathy    a. EF 20-25% by echo in 02/2020 b. at 40% by echo in 03/2020 c. EF normalized to 60-65% by echo in 04/2020   Myocardial infarction [facility])    Peripheral vascular disease ([place])    Sleep apnea    Type 2 diabetes mellitus ([place])      Family History  Problem Relation Age of Onset   Hypertension Mother     Past Surgical History:  Procedure Laterality Date   AMPUTATION Right 10/05/2022   Procedure: AMPUTATION BELOW KNEE;  Surgeon: Newt Minion, MD;  Location: [place];  Service: Orthopedics;  Laterality: Right;   AMPUTATION Right 11/14/2022   Procedure: AMPUTATION BELOW KNEE REVISION; WASHOUT;  Surgeon: Katha Cabal, MD;  [facility];  Service: Vascular;  Laterality: Right;   AMPUTATION Right 11/18/2022   Procedure: AMPUTATION BELOW KNEE REVISION AND CLOSURE;  Surgeon: Katha Cabal,  MD;  Location: [facility];  Service: Vascular;  Laterality: Right;   APPLICATION OF WOUND VAC Right 10/05/2022   Procedure: APPLICATION OF WOUND VAC;  Surgeon: Newt Minion, MD;  Location: [place];  Service: Orthopedics;  Laterality: Right;   BIOPSY  07/18/2021   Procedure: BIOPSY;  Surgeon: Eloise Harman, DO;  [facility];  Service: Endoscopy;;   BIOPSY  01/09/2022   Procedure: BIOPSY;  Surgeon: Eloise Harman, DO;  [facility];  Service: Endoscopy;;   COLONOSCOPY WITH PROPOFOL N/A 07/18/2021   [place]ver: 15 millimeter polyp removed from the sigmoid colon, 5 mm polyp removed from sigmoid colon.  Nonbleeding internal hemorrhoids.  Significant looping of the colon. sigmoid path showed invasive colonic adenocarcinoma involving tubular adenoma (invades to depth of 52m, carcinoma 169mfrom margin, no lymphovascular invasion, no poorly differentiated component.   CORONARY ARTERY BYPASS GRAFT N/A 03/13/2020   Procedure: CORONARY ARTERY BYPASS GRAFTING (CABG) times five using bilateral Internal mammary arteries and left radial artery;  Surgeon: AtWonda OldsMD;  [facility]: [facility] Service: Open Heart Surgery;  Laterality: N/A;   DENTAL SURGERY     ESOPHAGOGASTRODUODENOSCOPY (EGD) WITH PROPOFOL N/A 07/18/2021   Carver: 1 gastric polyp status post biopsy, gastritis. gastric bx with slight chronic inflammation  and no H.pyori. GEJ polypectomy with mild inflammation only   FLEXIBLE SIGMOIDOSCOPY N/A 08/26/2021   Carver: Nonbleeding internal hemorrhoids.  15 mm ulcers from previous polypectomy found in the rectum.  No evidence of previous polyp.  Located 5 to 8 cm from anal verge.   FLEXIBLE SIGMOIDOSCOPY N/A 01/09/2022   Procedure: FLEXIBLE SIGMOIDOSCOPY;  Surgeon: CaEloise HarmanDO;  [facility];  Service: Endoscopy;  Laterality: N/A;   POLYPECTOMY  07/18/2021   Procedure: POLYPECTOMY INTESTINAL;  Surgeon: CaEloise HarmanDO;  [facility];  Service: Endoscopy;;   RADIAL ARTERY HARVEST Left 03/13/2020   Procedure: RAPontiac  Surgeon: AtWonda OldsMD;  [facility]: [facility] Service: Open Heart Surgery;  Laterality: Left;   RIGHT/LEFT HEART CATH AND CORONARY ANGIOGRAPHY N/A 03/07/2020   Procedure: RIGHT/LEFT HEART CATH AND CORONARY ANGIOGRAPHY;  Surgeon: CoSherren MochaMD;  [facility];  Service: Cardiovascular;  Laterality: N/A;   STUMP REVISION Right 12/17/2022   Procedure: RIGHT BELOW KNEE AMPUTATION REVISION;  Surgeon: DuNewt MinionMD;  [facility] Service: Orthopedics;  Laterality: Right;   SUBMUCOSAL LIFTING INJECTION  01/09/2022   Procedure: SUBMUCOSAL LIFTING INJECTION;  Surgeon: CaEloise HarmanDO;  [facility];  Service: Endoscopy;;   SUBMUCOSAL TATTOO INJECTION  01/09/2022   Procedure: SUBMUCOSAL TATTOO INJECTION;  Surgeon: CaEloise HarmanDO;  [facility];  Service: Endoscopy;;   TEE WITHOUT CARDIOVERSION N/A 03/13/2020   Procedure: TRANSESOPHAGEAL ECHOCARDIOGRAM (TEE);  Surgeon: AtWonda OldsMD;  [facility] Service: Open Heart Surgery;  Laterality: N/A;   Social History   Occupational History   Not on file  Tobacco Use   Smoking status: Former    Packs/day: 1.50    Types: Cigarettes    Quit date: 05/25/1995    Years since quitting: 27.6   Smokeless tobacco: Never  Vaping Use    Vaping Use: Never used  Substance and Sexual Activity   Alcohol use: Not Currently    Comment: rarely   Drug use: No   Sexual activity: Not on file

## 2023-01-22 ENCOUNTER — Telehealth: Payer: Self-pay

## 2023-01-22 NOTE — Telephone Encounter (Signed)
SW Ichelle, I have pt scheduled to come in tomorrow morning for eval on new area. She will pass this on to transportation at rehab center.

## 2023-01-22 NOTE — Telephone Encounter (Signed)
Ichelle from [facility] called regarding the patient's BKA. There is now an open area with some yellowish drainage (no mention of drainage in the recent ov note). She is wanting to know if the orders need to be updated, and is requesting a call back. [phone]

## 2023-01-23 ENCOUNTER — Encounter: Payer: Self-pay | Admitting: Family

## 2023-01-23 ENCOUNTER — Ambulatory Visit (INDEPENDENT_AMBULATORY_CARE_PROVIDER_SITE_OTHER): Payer: Medicare Other | Admitting: Family

## 2023-01-23 DIAGNOSIS — Z89511 Acquired absence of right leg below knee: Secondary | ICD-10-CM

## 2023-01-23 NOTE — Progress Notes (Signed)
Post-Op Visit Note   Patient: Shane Chambers           Date of Birth: [birth year]           MRN: [id number] Visit Date: 01/23/2023 PCP: Caprice Renshaw, MD  Chief Complaint: No chief complaint on file.   HPI:  HPI The patient is a 62 year old gentleman seen status post right below-knee amputation revision on December 17, 2022. Seen today for evaluation concern for his wound nurse that he may be having some green drainage.  Also the patient concerned about some pain in blistering to his medial thigh from the limb protector Ortho Exam On examination of the right residual limb medially there is an area about 3 and half centimeters along the gaped open this is 3 mm deep there is fibrinous tissue there is no probing no drainage no purulence no erythema no warmth  Visit Diagnoses: No diagnosis found.  Plan: May pack open with silver cell continue daily dose of cleansing.  Continue shrinker.  Will discontinue the limb protector.  Follow-Up Instructions: No follow-ups on file.   Imaging: No results found.  Orders:  No orders of the defined types were placed in this encounter.  No orders of the defined types were placed in this encounter.    PMFS History: Patient Active Problem List   Diagnosis Date Noted   S/P BKA (below knee amputation) unilateral, right ([place]) 12/17/2022   MRSA infection 11/19/2022   Amputation stump infection ([place]) 11/19/2022   QT prolongation 11/10/2022   Severe recurrent major depression without psychotic features ([place]) 11/05/2022   Major depressive disorder with psychotic features ([place]) 11/02/2022   Acute hypoactive delirium due to another medical condition 10/13/2022   Septic arthritis of right ankle ([place]) 10/04/2022   Acute osteomyelitis ([place]) 10/02/2022   Subacute osteomyelitis, right ankle and foot ([place]) 10/02/2022   Hypothyroidism 10/02/2022   Hypoalbuminemia 10/02/2022   Cellulitis of right lower extremity 09/25/2022   Lactic acidosis 04/23/2022    Anemia of chronic disease 04/23/2022   Obesity, Class III, BMI 40-49.9 (morbid obesity) ([place]) 04/23/2022   AKI (acute kidney injury) ([place]) 04/22/2022   Elevated troponin 04/22/2022   Cancer of sigmoid colon ([place]) 12/16/2021   Constipation 12/16/2021   GERD (gastroesophageal reflux disease) 12/16/2021   Iron deficiency anemia 09/17/2021   Rectal cancer ([place]) 09/03/2021   Bilateral lower leg cellulitis [id number]   Systolic and diastolic CHF, chronic ([place]) 11/22/2020   CHF exacerbation ([place]) 08/01/2020   Visit for wound check 05/28/2020   OSA (obstructive sleep apnea) 05/24/2020   Acute exacerbation of chronic obstructive pulmonary disease (COPD) ([place]) 04/29/2020   Leukocytosis 04/29/2020   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   Ischemic cardiomyopathy    Coronary artery disease    Acute on chronic combined systolic and diastolic CHF (congestive heart failure) ([place]) 03/05/2020   Acute hyponatremia 03/05/2020   Severe Vitamin D deficiency 12/02/2019   Hypokalemia 12/01/2019   Hyperlipidemia 12/01/2019   BPH (benign prostatic hyperplasia) 12/01/2019   Normocytic anemia 12/01/2019   Pneumonia due to COVID-19 virus 12/01/2019   Hypoxia    SOB (shortness of breath) 11/16/2019   Severe sepsis ([place]) 11/16/2019   Hypertension    Type 2 diabetes mellitus ([place])    Depression    Acute bronchitis with bronchospasm 11/15/2019   Past Medical History:  Diagnosis Date   Anemia    Arthritis    CAD (coronary artery disease)    a. s/p CABG in 03/2020 with LIMA-LAD, RIMA-PL,  RA-D1-RI-OM1   Cancer (HCC)    rectal   Cellulitis    COPD (chronic obstructive pulmonary disease) (HCC)    Depression    Diabetes mellitus without complication (HCC)    GERD (gastroesophageal reflux disease)    History of kidney stones    Hyperlipidemia 12/01/2019   Hypertension    Hypothyroidism    Ischemic cardiomyopathy    a. EF 20-25% by echo in 02/2020 b. at 40% by echo in 03/2020 c. EF normalized to 60-65% by echo in  04/2020   Myocardial infarction [facility])    Peripheral vascular disease ([place])    Sleep apnea    Type 2 diabetes mellitus ([place])     Family History  Problem Relation Age of Onset   Hypertension Mother     Past Surgical History:  Procedure Laterality Date   AMPUTATION Right 10/05/2022   Procedure: AMPUTATION BELOW KNEE;  Surgeon: Newt Minion, MD;  Location: [place];  Service: Orthopedics;  Laterality: Right;   AMPUTATION Right 11/14/2022   Procedure: AMPUTATION BELOW KNEE REVISION; WASHOUT;  Surgeon: Katha Cabal, MD;  Location: [facility];  Service: Vascular;  Laterality: Right;   AMPUTATION Right 11/18/2022   Procedure: AMPUTATION BELOW KNEE REVISION AND CLOSURE;  Surgeon: Katha Cabal, MD;  Location: [facility];  Service: Vascular;  Laterality: Right;   APPLICATION OF WOUND VAC Right 10/05/2022   Procedure: APPLICATION OF WOUND VAC;  Surgeon: Newt Minion, MD;  Location: [place];  Service: Orthopedics;  Laterality: Right;   BIOPSY  07/18/2021   Procedure: BIOPSY;  Surgeon: Eloise Harman, DO;  [facility];  Service: Endoscopy;;   BIOPSY  01/09/2022   Procedure: BIOPSY;  Surgeon: Eloise Harman, DO;  [facility];  Service: Endoscopy;;   COLONOSCOPY WITH PROPOFOL N/A 07/18/2021   Carver: 15 millimeter polyp removed from the sigmoid colon, 5 mm polyp removed from sigmoid colon.  Nonbleeding internal hemorrhoids.  Significant looping of the colon. sigmoid path showed invasive colonic adenocarcinoma involving tubular adenoma (invades to depth of 30mm, carcinoma 6mm from margin, no lymphovascular invasion, no poorly differentiated component.   CORONARY ARTERY BYPASS GRAFT N/A 03/13/2020   Procedure: CORONARY ARTERY BYPASS GRAFTING (CABG) times five using bilateral Internal mammary arteries and left radial artery;  Surgeon: [name], MD;  Location: [place];  Service: Open Heart Surgery;  Laterality: N/A;   DENTAL SURGERY     ESOPHAGOGASTRODUODENOSCOPY (EGD)  WITH PROPOFOL N/A 07/18/2021   Carver: 1 gastric polyp status post biopsy, gastritis. gastric bx with slight chronic inflammation and no H.pyori. GEJ polypectomy with mild inflammation only   FLEXIBLE SIGMOIDOSCOPY N/A 08/26/2021   Carver: Nonbleeding internal hemorrhoids.  15 mm ulcers from previous polypectomy found in the rectum.  No evidence of previous polyp.  Located 5 to 8 cm from anal verge.   FLEXIBLE SIGMOIDOSCOPY N/A 01/09/2022   Procedure: FLEXIBLE SIGMOIDOSCOPY;  Surgeon: Eloise Harman, DO;  [facility];  Service: Endoscopy;  Laterality: N/A;   POLYPECTOMY  07/18/2021   Procedure: POLYPECTOMY INTESTINAL;  Surgeon: Eloise Harman, DO;  [facility];  Service: Endoscopy;;   RADIAL ARTERY HARVEST Left 03/13/2020   Procedure: [place],;  Surgeon: [name], MD;  Location: [place];  Service: Open Heart Surgery;  Laterality: Left;   RIGHT/LEFT HEART CATH AND CORONARY ANGIOGRAPHY N/A 03/07/2020   Procedure: RIGHT/LEFT HEART CATH AND CORONARY ANGIOGRAPHY;  Surgeon: Sherren Mocha, MD;  Location: [place]  [facility];  Service: Cardiovascular;  Laterality: N/A;   STUMP REVISION Right 12/17/2022   Procedure: RIGHT BELOW KNEE AMPUTATION REVISION;  Surgeon: Newt Minion, MD;  Location: [place];  Service: Orthopedics;  Laterality: Right;   SUBMUCOSAL LIFTING INJECTION  01/09/2022   Procedure: SUBMUCOSAL LIFTING INJECTION;  Surgeon: Eloise Harman, DO;  [facility];  Service: Endoscopy;;   SUBMUCOSAL TATTOO INJECTION  01/09/2022   Procedure: SUBMUCOSAL TATTOO INJECTION;  Surgeon: Eloise Harman, DO;  [facility];  Service: Endoscopy;;   TEE WITHOUT CARDIOVERSION N/A 03/13/2020   Procedure: TRANSESOPHAGEAL ECHOCARDIOGRAM (TEE);  Surgeon: [name], MD;  Location: [place];  Service: Open Heart Surgery;  Laterality: N/A;   Social History   Occupational History   Not on file  Tobacco Use   Smoking status: Former     Packs/day: 1.5    Types: Cigarettes    Quit date: 05/25/1995    Years since quitting: 27.6   Smokeless tobacco: Never  Vaping Use   Vaping Use: Never used  Substance and Sexual Activity   Alcohol use: Not Currently    Comment: rarely   Drug use: No   Sexual activity: Not on file

## 2023-02-10 ENCOUNTER — Inpatient Hospital Stay: Payer: Medicare Other | Attending: Hematology

## 2023-02-10 ENCOUNTER — Ambulatory Visit (HOSPITAL_COMMUNITY)
Admission: RE | Admit: 2023-02-10 | Discharge: 2023-02-10 | Disposition: A | Discharge status: Home / Self Care | Payer: Medicare Other | Source: Ambulatory Visit | Attending: Hematology | Admitting: Hematology

## 2023-02-10 ENCOUNTER — Encounter (HOSPITAL_COMMUNITY): Payer: Self-pay | Admitting: Radiology

## 2023-02-10 ENCOUNTER — Encounter: Payer: Medicare Other | Admitting: Family

## 2023-02-10 DIAGNOSIS — C20 Malignant neoplasm of rectum: Secondary | ICD-10-CM | POA: Diagnosis not present

## 2023-02-10 MED ORDER — IOHEXOL 300 MG/ML  SOLN
80.0000 mL | Freq: Once | INTRAMUSCULAR | Status: AC | PRN
  Administered 2023-02-10: 80 mL via INTRAVENOUS

## 2023-02-11 ENCOUNTER — Encounter: Payer: Self-pay | Admitting: Family

## 2023-02-11 ENCOUNTER — Ambulatory Visit (INDEPENDENT_AMBULATORY_CARE_PROVIDER_SITE_OTHER): Payer: Medicare Other | Admitting: Family

## 2023-02-11 DIAGNOSIS — Z89511 Acquired absence of right leg below knee: Secondary | ICD-10-CM

## 2023-02-11 NOTE — Progress Notes (Signed)
Post-Op Visit Note   Patient: Shane Chambers           Date of Birth: 1961-07-01           MRN: [id number] Visit Date: 02/11/2023 PCP: Caprice Renshaw, MD  Chief Complaint:  Chief Complaint  Patient presents with   Right Leg - Routine Post Op    12/17/2022 right BKA revision     HPI:  HPI The patient is a 62 year old gentleman who is seen status post right below-knee amputation February 7 he is currently residing at skilled nursing and [place].  Does have a 4 XL shrinker that he wears daily Ortho Exam On examination of the right residual limb this is well healed.  He does continue to have moderate edema there is no erythema or drainage  Visit Diagnoses: No diagnosis found.  Plan: Continue shrinker.  Given an order for his prosthesis set up he will call [facility] for an appointment proceed with prosthesis set up and gait training follow-up as needed in our office. Follow-Up Instructions: Return if symptoms worsen or fail to improve.   Imaging: CT Abdomen Pelvis W Contrast  Result Date: 02/10/2023 CLINICAL DATA:  Follow-up rectal carcinoma. Surveillance. * Tracking Code: BO * EXAM: CT ABDOMEN AND PELVIS WITH CONTRAST TECHNIQUE: Multidetector CT imaging of the abdomen and pelvis was performed using the standard protocol following bolus administration of intravenous contrast. RADIATION DOSE REDUCTION: This exam was performed according to the departmental dose-optimization program which includes automated exposure control, adjustment of the mA and/or kV according to patient size and/or use of iterative reconstruction technique. CONTRAST:  58mL OMNIPAQUE IOHEXOL 300 MG/ML  SOLN COMPARISON:  06/10/2022 FINDINGS: Lower Chest: No acute findings. Hepatobiliary: No hepatic masses identified. A few tiny calcified gallstones are noted, however there is no evidence of cholecystitis or biliary ductal dilatation. Pancreas:  No mass or inflammatory changes. Spleen: Within normal limits in size and  appearance. Adrenals/Urinary Tract: No suspicious masses identified. No evidence of ureteral calculi or hydronephrosis. Stomach/Bowel: No masses identified. No evidence of obstruction, inflammatory process or abnormal fluid collections. Vascular/Lymphatic: No pathologically enlarged lymph nodes. Sub-centimeter retroperitoneal lymph nodes remain stable. No acute vascular findings. Aortic atherosclerotic calcification incidentally noted. Reproductive:  No mass or other significant abnormality. Other:  None. Musculoskeletal:  No suspicious bone lesions identified. IMPRESSION: Stable exam. No evidence of recurrent or metastatic carcinoma within the abdomen or pelvis. Cholelithiasis. No radiographic evidence of cholecystitis. Aortic Atherosclerosis (ICD10-I70.0). Electronically Signed   By: Marlaine Hind M.D.   On: 02/10/2023 16:31    Orders:  No orders of the defined types were placed in this encounter.  No orders of the defined types were placed in this encounter.    PMFS History: Patient Active Problem List   Diagnosis Date Noted   S/P BKA (below knee amputation) unilateral, right 12/17/2022   MRSA infection 11/19/2022   Amputation stump infection 11/19/2022   QT prolongation 11/10/2022   Severe recurrent major depression without psychotic features 11/05/2022   Major depressive disorder with psychotic features 11/02/2022   Acute hypoactive delirium due to another medical condition 10/13/2022   Septic arthritis of right ankle 10/04/2022   Acute osteomyelitis 10/02/2022   Subacute osteomyelitis, right ankle and foot 10/02/2022   Hypothyroidism 10/02/2022   Hypoalbuminemia 10/02/2022   Cellulitis of right lower extremity 09/25/2022   Lactic acidosis 04/23/2022   Anemia of chronic disease 04/23/2022   Obesity, Class III, BMI 40-49.9 (morbid obesity) 04/23/2022   AKI (acute kidney injury) 04/22/2022  Elevated troponin 04/22/2022   Cancer of sigmoid colon 12/16/2021   Constipation 12/16/2021    GERD (gastroesophageal reflux disease) 12/16/2021   Iron deficiency anemia 09/17/2021   Rectal cancer 09/03/2021   Bilateral lower leg cellulitis [id number]   Systolic and diastolic CHF, chronic [id number]   CHF exacerbation 08/01/2020   Visit for wound check 05/28/2020   OSA (obstructive sleep apnea) 05/24/2020   Acute exacerbation of chronic obstructive pulmonary disease (COPD) 04/29/2020   Leukocytosis 04/29/2020   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   Ischemic cardiomyopathy    Coronary artery disease    Acute on chronic combined systolic and diastolic CHF (congestive heart failure) 03/05/2020   Acute hyponatremia 03/05/2020   Severe Vitamin D deficiency 12/02/2019   Hypokalemia 12/01/2019   Hyperlipidemia 12/01/2019   BPH (benign prostatic hyperplasia) 12/01/2019   Normocytic anemia 12/01/2019   Pneumonia due to COVID-19 virus 12/01/2019   Hypoxia    SOB (shortness of breath) 11/16/2019   Severe sepsis 11/16/2019   Hypertension    Type 2 diabetes mellitus    Depression    Acute bronchitis with bronchospasm 11/15/2019   Past Medical History:  Diagnosis Date   Anemia    Arthritis    CAD (coronary artery disease)    a. s/p CABG in 03/2020 with LIMA-LAD, RIMA-PL, RA-D1-RI-OM1   Cancer    rectal   Cellulitis    COPD (chronic obstructive pulmonary disease)    Depression    Diabetes mellitus without complication    GERD (gastroesophageal reflux disease)    History of kidney stones    Hyperlipidemia 12/01/2019   Hypertension    Hypothyroidism    Ischemic cardiomyopathy    a. EF 20-25% by echo in 02/2020 b. at 40% by echo in 03/2020 c. EF normalized to 60-65% by echo in 04/2020   Myocardial infarction    Peripheral vascular disease    Sleep apnea    Type 2 diabetes mellitus     Family History  Problem Relation Age of Onset   Hypertension Mother     Past Surgical History:  Procedure Laterality Date   AMPUTATION Right 10/05/2022   Procedure: AMPUTATION BELOW KNEE;   Surgeon: Newt Minion, MD;  Location: [place];  Service: Orthopedics;  Laterality: Right;   AMPUTATION Right 11/14/2022   Procedure: AMPUTATION BELOW KNEE REVISION; WASHOUT;  Surgeon: Katha Cabal, MD;  Location: [facility];  Service: Vascular;  Laterality: Right;   AMPUTATION Right 11/18/2022   Procedure: AMPUTATION BELOW KNEE REVISION AND CLOSURE;  Surgeon: Katha Cabal, MD;  Location: [facility];  Service: Vascular;  Laterality: Right;   APPLICATION OF WOUND VAC Right 10/05/2022   Procedure: APPLICATION OF WOUND VAC;  Surgeon: Newt Minion, MD;  Location: [place];  Service: Orthopedics;  Laterality: Right;   BIOPSY  07/18/2021   Procedure: BIOPSY;  Surgeon: Eloise Harman, DO;  [facility];  Service: Endoscopy;;   BIOPSY  01/09/2022   Procedure: BIOPSY;  Surgeon: Eloise Harman, DO;  [facility];  Service: Endoscopy;;   COLONOSCOPY WITH PROPOFOL N/A 07/18/2021   Carver: 15 millimeter polyp removed from the sigmoid colon, 5 mm polyp removed from sigmoid colon.  Nonbleeding internal hemorrhoids.  Significant looping of the colon. sigmoid path showed invasive colonic adenocarcinoma involving tubular adenoma (invades to depth of 22mm, carcinoma 26mm from margin, no lymphovascular invasion, no poorly differentiated component.   CORONARY ARTERY BYPASS GRAFT N/A 03/13/2020   Procedure: CORONARY ARTERY BYPASS GRAFTING (  CABG) times five using bilateral Internal mammary arteries and left radial artery;  Surgeon: [name], MD;  Location: [place];  Service: Open Heart Surgery;  Laterality: N/A;   DENTAL SURGERY     ESOPHAGOGASTRODUODENOSCOPY (EGD) WITH PROPOFOL N/A 07/18/2021   Carver: 1 gastric polyp status post biopsy, gastritis. gastric bx with slight chronic inflammation and no H.pyori. GEJ polypectomy with mild inflammation only   FLEXIBLE SIGMOIDOSCOPY N/A 08/26/2021   Carver: Nonbleeding internal hemorrhoids.  15 mm ulcers from previous polypectomy found in the  rectum.  No evidence of previous polyp.  Located 5 to 8 cm from anal verge.   FLEXIBLE SIGMOIDOSCOPY N/A 01/09/2022   Procedure: FLEXIBLE SIGMOIDOSCOPY;  Surgeon: Eloise Harman, DO;  [facility];  Service: Endoscopy;  Laterality: N/A;   POLYPECTOMY  07/18/2021   Procedure: POLYPECTOMY INTESTINAL;  Surgeon: Eloise Harman, DO;  [facility];  Service: Endoscopy;;   RADIAL ARTERY HARVEST Left 03/13/2020   Procedure: [place],;  Surgeon: [name], MD;  Location: [place];  Service: Open Heart Surgery;  Laterality: Left;   RIGHT/LEFT HEART CATH AND CORONARY ANGIOGRAPHY N/A 03/07/2020   Procedure: RIGHT/LEFT HEART CATH AND CORONARY ANGIOGRAPHY;  Surgeon: Sherren Mocha, MD;  Location: [place] [facility];  Service: Cardiovascular;  Laterality: N/A;   STUMP REVISION Right 12/17/2022   Procedure: RIGHT BELOW KNEE AMPUTATION REVISION;  Surgeon: Newt Minion, MD;  Location: [place];  Service: Orthopedics;  Laterality: Right;   SUBMUCOSAL LIFTING INJECTION  01/09/2022   Procedure: SUBMUCOSAL LIFTING INJECTION;  Surgeon: Eloise Harman, DO;  [facility];  Service: Endoscopy;;   SUBMUCOSAL TATTOO INJECTION  01/09/2022   Procedure: SUBMUCOSAL TATTOO INJECTION;  Surgeon: Eloise Harman, DO;  [facility];  Service: Endoscopy;;   TEE WITHOUT CARDIOVERSION N/A 03/13/2020   Procedure: TRANSESOPHAGEAL ECHOCARDIOGRAM (TEE);  Surgeon: [name], MD;  Location: [place];  Service: Open Heart Surgery;  Laterality: N/A;   Social History   Occupational History   Not on file  Tobacco Use   Smoking status: Former    Packs/day: 1.5    Types: Cigarettes    Quit date: 05/25/1995    Years since quitting: 27.7   Smokeless tobacco: Never  Vaping Use   Vaping Use: Never used  Substance and Sexual Activity   Alcohol use: Not Currently    Comment: rarely   Drug use: No   Sexual activity: Not on file

## 2023-02-14 ENCOUNTER — Emergency Department (HOSPITAL_COMMUNITY): Payer: Medicare Other

## 2023-02-14 ENCOUNTER — Inpatient Hospital Stay (HOSPITAL_COMMUNITY)
Admission: EM | Admit: 2023-02-14 | Discharge: 2023-02-20 | DRG: 682 | Disposition: A | Discharge status: Nursing Home (Includes ICF) | Payer: Medicare Other | Source: Skilled Nursing Facility | Attending: Internal Medicine | Admitting: Internal Medicine

## 2023-02-14 ENCOUNTER — Encounter (HOSPITAL_COMMUNITY): Payer: Self-pay

## 2023-02-14 ENCOUNTER — Other Ambulatory Visit: Payer: Self-pay

## 2023-02-14 DIAGNOSIS — J449 Chronic obstructive pulmonary disease, unspecified: Secondary | ICD-10-CM | POA: Diagnosis not present

## 2023-02-14 DIAGNOSIS — E11649 Type 2 diabetes mellitus with hypoglycemia without coma: Secondary | ICD-10-CM | POA: Diagnosis present

## 2023-02-14 DIAGNOSIS — Z89511 Acquired absence of right leg below knee: Secondary | ICD-10-CM

## 2023-02-14 DIAGNOSIS — Z91013 Allergy to seafood: Secondary | ICD-10-CM

## 2023-02-14 DIAGNOSIS — Z6834 Body mass index (BMI) 34.0-34.9, adult: Secondary | ICD-10-CM

## 2023-02-14 DIAGNOSIS — N138 Other obstructive and reflux uropathy: Secondary | ICD-10-CM | POA: Diagnosis present

## 2023-02-14 DIAGNOSIS — Z7982 Long term (current) use of aspirin: Secondary | ICD-10-CM

## 2023-02-14 DIAGNOSIS — E1151 Type 2 diabetes mellitus with diabetic peripheral angiopathy without gangrene: Secondary | ICD-10-CM | POA: Diagnosis present

## 2023-02-14 DIAGNOSIS — E782 Mixed hyperlipidemia: Secondary | ICD-10-CM | POA: Diagnosis present

## 2023-02-14 DIAGNOSIS — I5032 Chronic diastolic (congestive) heart failure: Secondary | ICD-10-CM | POA: Diagnosis present

## 2023-02-14 DIAGNOSIS — N170 Acute kidney failure with tubular necrosis: Principal | ICD-10-CM | POA: Diagnosis present

## 2023-02-14 DIAGNOSIS — K219 Gastro-esophageal reflux disease without esophagitis: Secondary | ICD-10-CM | POA: Diagnosis not present

## 2023-02-14 DIAGNOSIS — E1122 Type 2 diabetes mellitus with diabetic chronic kidney disease: Secondary | ICD-10-CM | POA: Diagnosis present

## 2023-02-14 DIAGNOSIS — K529 Noninfective gastroenteritis and colitis, unspecified: Secondary | ICD-10-CM | POA: Diagnosis present

## 2023-02-14 DIAGNOSIS — E039 Hypothyroidism, unspecified: Secondary | ICD-10-CM | POA: Diagnosis present

## 2023-02-14 DIAGNOSIS — N400 Enlarged prostate without lower urinary tract symptoms: Secondary | ICD-10-CM | POA: Diagnosis present

## 2023-02-14 DIAGNOSIS — J90 Pleural effusion, not elsewhere classified: Secondary | ICD-10-CM | POA: Insufficient documentation

## 2023-02-14 DIAGNOSIS — I2510 Atherosclerotic heart disease of native coronary artery without angina pectoris: Secondary | ICD-10-CM | POA: Diagnosis present

## 2023-02-14 DIAGNOSIS — N250 Renal osteodystrophy: Secondary | ICD-10-CM | POA: Diagnosis present

## 2023-02-14 DIAGNOSIS — E8729 Other acidosis: Secondary | ICD-10-CM | POA: Diagnosis not present

## 2023-02-14 DIAGNOSIS — E46 Unspecified protein-calorie malnutrition: Secondary | ICD-10-CM | POA: Diagnosis present

## 2023-02-14 DIAGNOSIS — E8809 Other disorders of plasma-protein metabolism, not elsewhere classified: Secondary | ICD-10-CM | POA: Diagnosis present

## 2023-02-14 DIAGNOSIS — M1990 Unspecified osteoarthritis, unspecified site: Secondary | ICD-10-CM | POA: Diagnosis present

## 2023-02-14 DIAGNOSIS — Z79899 Other long term (current) drug therapy: Secondary | ICD-10-CM

## 2023-02-14 DIAGNOSIS — R112 Nausea with vomiting, unspecified: Secondary | ICD-10-CM | POA: Diagnosis not present

## 2023-02-14 DIAGNOSIS — J440 Chronic obstructive pulmonary disease with acute lower respiratory infection: Secondary | ICD-10-CM | POA: Diagnosis present

## 2023-02-14 DIAGNOSIS — F32A Depression, unspecified: Secondary | ICD-10-CM | POA: Diagnosis present

## 2023-02-14 DIAGNOSIS — Z79890 Hormone replacement therapy (postmenopausal): Secondary | ICD-10-CM

## 2023-02-14 DIAGNOSIS — K5130 Ulcerative (chronic) rectosigmoiditis without complications: Secondary | ICD-10-CM | POA: Diagnosis present

## 2023-02-14 DIAGNOSIS — D631 Anemia in chronic kidney disease: Secondary | ICD-10-CM | POA: Diagnosis present

## 2023-02-14 DIAGNOSIS — E669 Obesity, unspecified: Secondary | ICD-10-CM | POA: Diagnosis present

## 2023-02-14 DIAGNOSIS — N401 Enlarged prostate with lower urinary tract symptoms: Secondary | ICD-10-CM | POA: Diagnosis present

## 2023-02-14 DIAGNOSIS — Z888 Allergy status to other drugs, medicaments and biological substances status: Secondary | ICD-10-CM

## 2023-02-14 DIAGNOSIS — I509 Heart failure, unspecified: Secondary | ICD-10-CM

## 2023-02-14 DIAGNOSIS — E875 Hyperkalemia: Secondary | ICD-10-CM | POA: Diagnosis not present

## 2023-02-14 DIAGNOSIS — E872 Acidosis, unspecified: Secondary | ICD-10-CM | POA: Diagnosis present

## 2023-02-14 DIAGNOSIS — I130 Hypertensive heart and chronic kidney disease with heart failure and stage 1 through stage 4 chronic kidney disease, or unspecified chronic kidney disease: Secondary | ICD-10-CM | POA: Diagnosis present

## 2023-02-14 DIAGNOSIS — N320 Bladder-neck obstruction: Secondary | ICD-10-CM | POA: Diagnosis present

## 2023-02-14 DIAGNOSIS — Z8249 Family history of ischemic heart disease and other diseases of the circulatory system: Secondary | ICD-10-CM

## 2023-02-14 DIAGNOSIS — I252 Old myocardial infarction: Secondary | ICD-10-CM

## 2023-02-14 DIAGNOSIS — Z794 Long term (current) use of insulin: Secondary | ICD-10-CM

## 2023-02-14 DIAGNOSIS — T508X5A Adverse effect of diagnostic agents, initial encounter: Secondary | ICD-10-CM | POA: Diagnosis present

## 2023-02-14 DIAGNOSIS — Z87891 Personal history of nicotine dependence: Secondary | ICD-10-CM

## 2023-02-14 DIAGNOSIS — Z22322 Carrier or suspected carrier of Methicillin resistant Staphylococcus aureus: Secondary | ICD-10-CM

## 2023-02-14 DIAGNOSIS — R34 Anuria and oliguria: Secondary | ICD-10-CM | POA: Diagnosis present

## 2023-02-14 DIAGNOSIS — G4733 Obstructive sleep apnea (adult) (pediatric): Secondary | ICD-10-CM | POA: Diagnosis present

## 2023-02-14 DIAGNOSIS — K5900 Constipation, unspecified: Secondary | ICD-10-CM | POA: Diagnosis not present

## 2023-02-14 DIAGNOSIS — J189 Pneumonia, unspecified organism: Secondary | ICD-10-CM | POA: Diagnosis present

## 2023-02-14 DIAGNOSIS — J810 Acute pulmonary edema: Secondary | ICD-10-CM | POA: Diagnosis not present

## 2023-02-14 DIAGNOSIS — J811 Chronic pulmonary edema: Secondary | ICD-10-CM | POA: Insufficient documentation

## 2023-02-14 DIAGNOSIS — I5021 Acute systolic (congestive) heart failure: Secondary | ICD-10-CM | POA: Diagnosis not present

## 2023-02-14 DIAGNOSIS — N1831 Chronic kidney disease, stage 3a: Secondary | ICD-10-CM | POA: Diagnosis present

## 2023-02-14 DIAGNOSIS — Z951 Presence of aortocoronary bypass graft: Secondary | ICD-10-CM

## 2023-02-14 DIAGNOSIS — E871 Hypo-osmolality and hyponatremia: Secondary | ICD-10-CM | POA: Diagnosis present

## 2023-02-14 DIAGNOSIS — Z85048 Personal history of other malignant neoplasm of rectum, rectosigmoid junction, and anus: Secondary | ICD-10-CM

## 2023-02-14 DIAGNOSIS — I255 Ischemic cardiomyopathy: Secondary | ICD-10-CM | POA: Diagnosis present

## 2023-02-14 DIAGNOSIS — Z87442 Personal history of urinary calculi: Secondary | ICD-10-CM

## 2023-02-14 DIAGNOSIS — Z880 Allergy status to penicillin: Secondary | ICD-10-CM

## 2023-02-14 DIAGNOSIS — N179 Acute kidney failure, unspecified: Secondary | ICD-10-CM | POA: Diagnosis not present

## 2023-02-14 DIAGNOSIS — Z7951 Long term (current) use of inhaled steroids: Secondary | ICD-10-CM

## 2023-02-14 DIAGNOSIS — I10 Essential (primary) hypertension: Secondary | ICD-10-CM | POA: Diagnosis present

## 2023-02-14 DIAGNOSIS — Z7984 Long term (current) use of oral hypoglycemic drugs: Secondary | ICD-10-CM

## 2023-02-14 LAB — CBC WITH DIFFERENTIAL/PLATELET
Abs Immature Granulocytes: 0.05 10*3/uL (ref 0.00–0.07)
Basophils Absolute: 0 10*3/uL (ref 0.0–0.1)
Basophils Relative: 0 %
Eosinophils Absolute: 0.1 10*3/uL (ref 0.0–0.5)
Eosinophils Relative: 1 %
HCT: 27.1 % — ABNORMAL LOW (ref 39.0–52.0)
Hemoglobin: 8.6 g/dL — ABNORMAL LOW (ref 13.0–17.0)
Immature Granulocytes: 1 %
Lymphocytes Relative: 8 %
Lymphs Abs: 0.9 10*3/uL (ref 0.7–4.0)
MCH: 28.8 pg (ref 26.0–34.0)
MCHC: 31.7 g/dL (ref 30.0–36.0)
MCV: 90.6 fL (ref 80.0–100.0)
Monocytes Absolute: 0.7 10*3/uL (ref 0.1–1.0)
Monocytes Relative: 6 %
Neutro Abs: 9.2 10*3/uL — ABNORMAL HIGH (ref 1.7–7.7)
Neutrophils Relative %: 84 %
Platelets: 319 10*3/uL (ref 150–400)
RBC: 2.99 MIL/uL — ABNORMAL LOW (ref 4.22–5.81)
RDW: 12.4 % (ref 11.5–15.5)
WBC: 11 10*3/uL — ABNORMAL HIGH (ref 4.0–10.5)
nRBC: 0 % (ref 0.0–0.2)

## 2023-02-14 LAB — BASIC METABOLIC PANEL
Anion gap: 23 — ABNORMAL HIGH (ref 5–15)
BUN: 80 mg/dL — ABNORMAL HIGH (ref 6–20)
CO2: 11 mmol/L — ABNORMAL LOW (ref 22–32)
Calcium: 8.9 mg/dL (ref 8.9–10.3)
Chloride: 98 mmol/L (ref 98–111)
Creatinine, Ser: 6.38 mg/dL — ABNORMAL HIGH (ref 0.61–1.24)
GFR, Estimated: 9 mL/min — ABNORMAL LOW (ref >60)
Glucose, Bld: 107 mg/dL — ABNORMAL HIGH (ref 70–99)
Potassium: 6.3 mmol/L (ref 3.5–5.1)
Sodium: 132 mmol/L — ABNORMAL LOW (ref 135–145)

## 2023-02-14 LAB — TROPONIN I (HIGH SENSITIVITY): Troponin I (High Sensitivity): 11 ng/L (ref <18)

## 2023-02-14 LAB — COMPREHENSIVE METABOLIC PANEL
ALT: 11 U/L (ref 0–44)
AST: 19 U/L (ref 15–41)
Albumin: 3.3 g/dL — ABNORMAL LOW (ref 3.5–5.0)
Alkaline Phosphatase: 126 U/L (ref 38–126)
Anion gap: 20 — ABNORMAL HIGH (ref 5–15)
BUN: 78 mg/dL — ABNORMAL HIGH (ref 6–20)
CO2: 15 mmol/L — ABNORMAL LOW (ref 22–32)
Calcium: 9 mg/dL (ref 8.9–10.3)
Chloride: 96 mmol/L — ABNORMAL LOW (ref 98–111)
Creatinine, Ser: 6.03 mg/dL — ABNORMAL HIGH (ref 0.61–1.24)
GFR, Estimated: 10 mL/min — ABNORMAL LOW (ref >60)
Glucose, Bld: 69 mg/dL — ABNORMAL LOW (ref 70–99)
Potassium: 6.9 mmol/L (ref 3.5–5.1)
Sodium: 131 mmol/L — ABNORMAL LOW (ref 135–145)
Total Bilirubin: 0.7 mg/dL (ref 0.3–1.2)
Total Protein: 7.6 g/dL (ref 6.5–8.1)

## 2023-02-14 LAB — CBG MONITORING, ED
Glucose-Capillary: 68 mg/dL — ABNORMAL LOW (ref 70–99)
Glucose-Capillary: 105 mg/dL — ABNORMAL HIGH (ref 70–99)

## 2023-02-14 LAB — LIPASE, BLOOD: Lipase: 37 U/L (ref 11–51)

## 2023-02-14 MED ORDER — DEXTROSE 50 % IV SOLN
1.0000 | Freq: Once | INTRAVENOUS | Status: AC
  Administered 2023-02-14: 50 mL via INTRAVENOUS
  Filled 2023-02-14: qty 50

## 2023-02-14 MED ORDER — INSULIN ASPART 100 UNIT/ML IV SOLN
5.0000 [IU] | Freq: Once | INTRAVENOUS | Status: AC
  Administered 2023-02-14: 5 [IU] via INTRAVENOUS

## 2023-02-14 MED ORDER — CALCIUM GLUCONATE-NACL 1-0.675 GM/50ML-% IV SOLN
1.0000 g | Freq: Once | INTRAVENOUS | Status: AC
  Administered 2023-02-14: 1000 mg via INTRAVENOUS
  Filled 2023-02-14: qty 50

## 2023-02-14 MED ORDER — SODIUM BICARBONATE 8.4 % IV SOLN
INTRAVENOUS | Status: AC
  Filled 2023-02-14: qty 150

## 2023-02-14 MED ORDER — METOCLOPRAMIDE HCL 5 MG/ML IJ SOLN
10.0000 mg | Freq: Once | INTRAMUSCULAR | Status: AC
  Administered 2023-02-14: 10 mg via INTRAVENOUS
  Filled 2023-02-14: qty 2

## 2023-02-14 MED ORDER — LORAZEPAM 2 MG/ML IJ SOLN
0.5000 mg | Freq: Once | INTRAMUSCULAR | Status: AC
  Administered 2023-02-14: 0.5 mg via INTRAVENOUS
  Filled 2023-02-14: qty 1

## 2023-02-14 MED ORDER — ALBUTEROL SULFATE (2.5 MG/3ML) 0.083% IN NEBU
10.0000 mg | INHALATION_SOLUTION | Freq: Once | RESPIRATORY_TRACT | Status: AC
  Administered 2023-02-14: 10 mg via RESPIRATORY_TRACT
  Filled 2023-02-14: qty 12

## 2023-02-14 MED ORDER — SODIUM ZIRCONIUM CYCLOSILICATE 5 G PO PACK
10.0000 g | PACK | Freq: Once | ORAL | Status: AC
  Administered 2023-02-14: 10 g via ORAL
  Filled 2023-02-14: qty 2

## 2023-02-14 MED ORDER — SODIUM CHLORIDE 0.9 % IV BOLUS
1000.0000 mL | Freq: Once | INTRAVENOUS | Status: AC
  Administered 2023-02-14: 1000 mL via INTRAVENOUS

## 2023-02-14 MED ORDER — SODIUM BICARBONATE 8.4 % IV SOLN
50.0000 meq | Freq: Once | INTRAVENOUS | Status: AC
  Administered 2023-02-14: 50 meq via INTRAVENOUS
  Filled 2023-02-14: qty 50

## 2023-02-14 MED ORDER — SODIUM CHLORIDE 0.9 % IV BOLUS (SEPSIS)
2000.0000 mL | Freq: Once | INTRAVENOUS | Status: AC
  Administered 2023-02-14: 2000 mL via INTRAVENOUS

## 2023-02-14 MED ORDER — SODIUM BICARBONATE 8.4 % IV SOLN
INTRAVENOUS | Status: DC
  Filled 2023-02-14 (×7): qty 1000

## 2023-02-14 NOTE — ED Notes (Signed)
Patient transported to CT 

## 2023-02-14 NOTE — ED Notes (Signed)
AC called regarding pt's medication needed from main pharmacy. AC to bring to ed

## 2023-02-14 NOTE — H&P (Signed)
History and Physical    Patient: Shane Chambers ZOX:[id number] DOB: 28-Jan-1961 DOA: 02/14/2023 DOS: the patient was seen and examined on 02/15/2023 PCP: [name], MD  Patient coming from: ALF  Chief Complaint:  Chief Complaint  Patient presents with   Nausea   Diarrhea   HPI: Shane Chambers is a 62 y.o. male with medical history significant of hypertension, hyperlipidemia, T2DM, GERD, COPD, CAD, hypothyroidism who presents to the emergency department via EMS from [name]) due to 1 to 2-day onset of worsening shortness of breath.  He complained of several months of chronic diarrhea (which consists of intermittent loose/mushy/watery stools) and 3 episodes of vomiting today.  EMS was activated due to patient's complaints of shortness of breath.  He endorsed some chills, but denies fever, abdominal pain, chest pain.  ED Course:  In the emergency department, he was hemodynamically stable, BP was 114/50 and other vital signs were within normal range.  Workup in the ED showed WBC 11.0, H/H 8.6/27.1, MCV 90.6, platelets 319.  BMP showed sodium 131, potassium 6.9, chloride 96, bicarb 15, blood glucose 69, BUN 78, creatinine 6.03, albumin 3.3, EGFR 10.  Anion gap 20.  Troponin 11, lipase 37. CT abdomen and pelvis without contrast showed: 1. Possible mild proctocolitis. 2. Increased nonspecific perinephric stranding compared to 02/10/2023. This may be related to fluid status/heart failure given pulmonary, mesenteric and body wall edema. 3. Interstitial pulmonary edema with small right and trace left pleural effusions. 4. Consolidation in the right middle lobe may reflect atelectasis or pneumonia. Breathing treatment was provided, Ativan was given, Reglan was given insulin and dextrose 50% were given due to hyperkalemia, calcium gluconate was given to stabilize the heart.  IV hydration of 1 L NS was provided. Nephrologist was consulted due to acute kidney injury with  EGFR of 10 (this was > 60 as of 11/28/2022) with some recommendations per ED PA.  Hospitalist was asked to admit patient for further evaluation and management.  Review of Systems: Review of systems as noted in the HPI. All other systems reviewed and are negative.   Past Medical History:  Diagnosis Date   Anemia    Arthritis    CAD (coronary artery disease)    a. s/p CABG in 03/2020 with LIMA-LAD, RIMA-PL, RA-D1-RI-OM1   Cancer    rectal   Cellulitis    COPD (chronic obstructive pulmonary disease)    Depression    Diabetes mellitus without complication    GERD (gastroesophageal reflux disease)    History of kidney stones    Hyperlipidemia 12/01/2019   Hypertension    Hypothyroidism    Ischemic cardiomyopathy    a. EF 20-25% by echo in 02/2020 b. at 40% by echo in 03/2020 c. EF normalized to 60-65% by echo in 04/2020   Myocardial infarction    Peripheral vascular disease    Sleep apnea    Type 2 diabetes mellitus    Past Surgical History:  Procedure Laterality Date   AMPUTATION Right 10/05/2022   Procedure: AMPUTATION BELOW KNEE;  Surgeon: Nadara Mustard, MD;  [facility];  Service: Orthopedics;  Laterality: Right;   AMPUTATION Right 11/14/2022   Procedure: AMPUTATION BELOW KNEE REVISION; WASHOUT;  Surgeon: Renford Dills, MD;  Location: [facility] ORS;  Service: Vascular;  Laterality: Right;   AMPUTATION Right 11/18/2022   Procedure: AMPUTATION BELOW KNEE REVISION AND CLOSURE;  Surgeon: Renford Dills, MD;  Location: [facility] ORS;  Service: Vascular;  Laterality: Right;  APPLICATION OF WOUND VAC Right 10/05/2022   Procedure: APPLICATION OF WOUND VAC;  Surgeon: Nadara Mustard, MD;  Location: [facility] OR;  Service: Orthopedics;  Laterality: Right;   BIOPSY  07/18/2021   Procedure: BIOPSY;  Surgeon: Lanelle Bal, DO;  [facility]: [facility];  Service: Endoscopy;;   BIOPSY  01/09/2022   Procedure: BIOPSY;  Surgeon: Lanelle Bal, DO;  [facility]: [facility];  Service:  Endoscopy;;   COLONOSCOPY WITH PROPOFOL N/A 07/18/2021   Carver: 15 millimeter polyp removed from the sigmoid colon, 5 mm polyp removed from sigmoid colon.  Nonbleeding internal hemorrhoids.  Significant looping of the colon. sigmoid path showed invasive colonic adenocarcinoma involving tubular adenoma (invades to depth of 2mm, carcinoma 1mm from margin, no lymphovascular invasion, no poorly differentiated component.   CORONARY ARTERY BYPASS GRAFT N/A 03/13/2020   Procedure: CORONARY ARTERY BYPASS GRAFTING (CABG) times five using bilateral Internal mammary arteries and left radial artery;  Surgeon: Linden Dolin, MD;  Location: [facility] OR;  Service: Open Heart Surgery;  Laterality: N/A;   DENTAL SURGERY     ESOPHAGOGASTRODUODENOSCOPY (EGD) WITH PROPOFOL N/A 07/18/2021   Carver: 1 gastric polyp status post biopsy, gastritis. gastric bx with slight chronic inflammation and no H.pyori. GEJ polypectomy with mild inflammation only   FLEXIBLE SIGMOIDOSCOPY N/A 08/26/2021   Carver: Nonbleeding internal hemorrhoids.  15 mm ulcers from previous polypectomy found in the rectum.  No evidence of previous polyp.  Located 5 to 8 cm from anal verge.   FLEXIBLE SIGMOIDOSCOPY N/A 01/09/2022   Procedure: FLEXIBLE SIGMOIDOSCOPY;  Surgeon: Lanelle Bal, DO;  [facility];  Service: Endoscopy;  Laterality: N/A;   POLYPECTOMY  07/18/2021   Procedure: POLYPECTOMY INTESTINAL;  Surgeon: Lanelle Bal, DO;  [facility];  Service: Endoscopy;;   RADIAL ARTERY HARVEST Left 03/13/2020   Procedure: RADIAL ARTERY HARVEST,;  Surgeon: Linden Dolin, MD;  Location: [facility] OR;  Service: Open Heart Surgery;  Laterality: Left;   RIGHT/LEFT HEART CATH AND CORONARY ANGIOGRAPHY N/A 03/07/2020   Procedure: RIGHT/LEFT HEART CATH AND CORONARY ANGIOGRAPHY;  Surgeon: Tonny Bollman, MD;  Location[facility] CV [facility];  Service: Cardiovascular;  Laterality: N/A;   STUMP REVISION Right 12/17/2022   Procedure: RIGHT  BELOW KNEE AMPUTATION REVISION;  Surgeon: Nadara Mustard, MD;  Location: [facility] OR;  Service: Orthopedics;  Laterality: Right;   SUBMUCOSAL LIFTING INJECTION  01/09/2022   Procedure: SUBMUCOSAL LIFTING INJECTION;  Surgeon: Lanelle Bal, DO;  [facility];  Service: Endoscopy;;   SUBMUCOSAL TATTOO INJECTION  01/09/2022   Procedure: SUBMUCOSAL TATTOO INJECTION;  Surgeon: Lanelle Bal, DO;  [facility];  Service: Endoscopy;;   TEE WITHOUT CARDIOVERSION N/A 03/13/2020   Procedure: TRANSESOPHAGEAL ECHOCARDIOGRAM (TEE);  Surgeon: Linden Dolin, MD;  Location: [facility] OR;  Service: Open Heart Surgery;  Laterality: N/A;    Social History:  reports that he quit smoking about 27 years ago. His smoking use included cigarettes. He smoked an average of 1.5 packs per day. He has never used smokeless tobacco. He reports that he does not currently use alcohol. He reports that he does not use drugs.   Allergies  Allergen Reactions   Ace Inhibitors Swelling and Cough    (Not on MAR at [name])   Haloperidol And Related     Do NOT give anti-psychotics due to risk of  Torsades and QT prolongation (per Dr. Teena Irani request)   Other  Itching    [name]   Shellfish Allergy     Listed on MAR   Penicillins Itching and Rash    Has patient had a PCN reaction causing immediate rash, facial/tongue/throat swelling, SOB or lightheadedness with hypotension: Unknown Has patient had a PCN reaction causing severe rash involving mucus membranes or skin necrosis: Unknown Has patient had a PCN reaction that required hospitalization: Unknown Has patient had a PCN reaction occurring within the last 10 years: Unknown If all of the above answers are "NO", then may proceed with Cephalosporin use.  Tolerated ancef (12-17-22)     Family History  Problem Relation Age of Onset   Hypertension Mother      Prior to Admission medications   Medication Sig Start Date  End Date Taking? Authorizing Provider  acetaminophen (TYLENOL) 500 MG tablet Take 1,000 mg by mouth every 8 (eight) hours as needed for fever or moderate pain.    [provider]  albuterol (VENTOLIN HFA) 108 (90 Base) MCG/ACT inhaler Inhale 2 puffs into the lungs every 6 (six) hours as needed for wheezing or shortness of breath. 05/02/22   Johnson, Clanford L, MD  alum & mag hydroxide-simeth (MAALOX/MYLANTA) 200-200-20 MG/5ML suspension Take 30 mLs by mouth every 6 (six) hours as needed for indigestion or heartburn. 10/17/22   Ghimire, Werner LeanShanker M, MD  amLODipine (NORVASC) 10 MG tablet Take 1 tablet (10 mg total) by mouth daily. 10/18/22   Ghimire, Werner LeanShanker M, MD  ascorbic acid (VITAMIN C) 500 MG tablet Take 1 tablet (500 mg total) by mouth 2 (two) times daily. 11/28/22 02/26/23  Gillis SantaKumar, Dileep, MD  aspirin 81 MG EC tablet Take 1 tablet (81 mg total) by mouth daily with breakfast. 11/29/20   Shon HaleEmokpae, Courage, MD  atorvastatin (LIPITOR) 40 MG tablet Take 1 tablet (40 mg total) by mouth at bedtime. 11/28/22   Gillis SantaKumar, Dileep, MD  barrier cream (NON-SPECIFIED) CREA Apply 1 Application topically in the morning, at noon, and at bedtime. Every shift apply to buttocks    [provider]  budesonide-formoterol (SYMBICORT) 160-4.5 MCG/ACT inhaler Inhale 2 puffs into the lungs in the morning and at bedtime. 05/24/20   Oretha MilchAlva, Rakesh V, MD  Cholecalciferol 125 MCG (5000 UT) TABS Take 1 tablet by mouth every Monday.    [provider]  clotrimazole-betamethasone (LOTRISONE) cream Apply 1 Application topically 2 (two) times daily.    [provider]  ferrous gluconate (FERGON) 324 MG tablet Take 324 mg by mouth daily.    [provider]  gabapentin (NEURONTIN) 300 MG capsule Take 2 capsules (600 mg total) by mouth 2 (two) times daily. 11/28/22   Gillis SantaKumar, Dileep, MD  Glucerna [facility](GLUCERNA) LIQD Take 1 Can by mouth with breakfast, with lunch, and with evening meal.    [provider]   insulin aspart (NOVOLOG FLEXPEN) 100 UNIT/ML FlexPen 0-15 Units, Subcutaneous, 3 times daily with meals CBG < 70: implement hypoglycemia protocol-call MD CBG 70 - 120: 0 units CBG 121 - 150: 2 units CBG 151 - 200: 3 units CBG 201 - 250: 5 units CBG 251 - 300: 8 units CBG 301 - 350: 11 units CBG 351 - 400: 15 units CBG > 400: 10/17/22   Ghimire, Werner LeanShanker M, MD  ipratropium-albuterol (DUONEB) 0.5-2.5 (3) MG/3ML SOLN Take 3 mLs by nebulization every 6 (six) hours as needed. 10/17/22   Ghimire, Werner LeanShanker M, MD  levothyroxine (SYNTHROID) 75 MCG tablet Take 75 mcg by mouth daily before breakfast.    [provider]  lurasidone (LATUDA) 20 MG TABS tablet Take 20 mg by mouth every evening.    [provider]  magnesium oxide (MAG-OX) 400 MG tablet Take 400 mg by mouth daily.    [provider]  metFORMIN (GLUCOPHAGE) 1000 MG tablet Take 1,000 mg by mouth 2 (two) times daily.    [provider]  metoprolol tartrate (LOPRESSOR) 50 MG tablet Take 1 tablet (50 mg total) by mouth 2 (two) times daily. 11/28/22   Gillis Santa, MD  Multiple Vitamin (MULTIVITAMIN) tablet Take 1 tablet by mouth daily.    [provider]  omeprazole (PRILOSEC) 40 MG capsule Take 40 mg by mouth daily. 01/01/22   [provider]  oxyCODONE-acetaminophen (PERCOCET/ROXICET) 5-325 MG tablet Take 1 tablet by mouth every 4 (four) hours as needed. 12/19/22   Nadara Mustard, MD  polyethylene glycol (MIRALAX / GLYCOLAX) 17 g packet Take 17 g by mouth daily as needed for moderate constipation.    [provider]  polyvinyl alcohol (LIQUIFILM TEARS) 1.4 % ophthalmic solution Place 1 drop into both eyes daily.    [provider]  potassium chloride SA (KLOR-CON M) 20 MEQ tablet Take 1 tablet (20 mEq total) by mouth daily. 05/05/22   Johnson, Clanford L, MD  senna-docusate (SENOKOT-S) 8.6-50 MG tablet Take 1 tablet by mouth 2 (two) times daily. 10/17/22   Ghimire, Werner Lean, MD   tamsulosin (FLOMAX) 0.4 MG CAPS capsule Take 0.8 mg by mouth every evening.    [provider]  thiamine (VITAMIN B-1) 100 MG tablet Take 1 tablet (100 mg total) by mouth daily. 10/18/22   Ghimire, Werner Lean, MD  torsemide (DEMADEX) 20 MG tablet Take 2 tablets (40 mg total) by mouth daily. 10/17/22   Ghimire, Werner Lean, MD  venlafaxine XR (EFFEXOR-XR) 150 MG 24 hr capsule Take 1 capsule (150 mg total) by mouth daily with breakfast. 11/29/22   Gillis Santa, MD  Vitamin D, Ergocalciferol, (DRISDOL) 1.25 MG (50000 UNIT) CAPS capsule Take 1 capsule (50,000 Units total) by mouth every 7 (seven) days. 12/03/22 03/03/23  Gillis Santa, MD    Physical Exam: BP 94/65   Pulse 75   Temp 97.6 F (36.4 C) (Oral)   Resp 20   Ht 5\' 9"  (1.753 m)   Wt 106.1 kg   SpO2 97%   BMI 34.56 kg/m   General: 62 y.o. year-old male well developed well nourished in no acute distress.  Alert and oriented x3. HEENT: NCAT, EOMI Neck: Supple, trachea medial Cardiovascular: Regular rate and rhythm with no rubs or gallops.  No thyromegaly or JVD noted.  No lower extremity edema. 2/4 pulses in all 4 extremities. Respiratory: Bilateral Rales in lower lobes on auscultation. No rales.  Abdomen: Soft, nontender nondistended with normal bowel sounds x4 quadrants. Muskuloskeletal: Right BKA.  No cyanosis, clubbing or edema noted bilaterally Neuro: CN II-XII intact, sensation, reflexes intact Skin: No ulcerative lesions noted or rashes Psychiatry: Judgement and insight appear normal. Mood is appropriate for condition and setting          Labs on Admission:  Basic Metabolic Panel: Recent Labs  Lab 02/14/23 1755 02/14/23 2207  NA 131* 132*  K 6.9* 6.3*  CL 96* 98  CO2 15* 11*  GLUCOSE 69* 107*  BUN 78* 80*  CREATININE 6.03* 6.38*  CALCIUM 9.0 8.9   Liver Function Tests: Recent Labs  Lab 02/14/23 1755  AST 19  ALT 11  ALKPHOS 126  BILITOT 0.7  PROT 7.6  ALBUMIN 3.3*   Recent Labs  Lab  02/14/23 1755  LIPASE 37   No results for input(s): "AMMONIA" in the last 168 hours. CBC: Recent Labs  Lab 02/14/23 1640  WBC 11.0*  NEUTROABS 9.2*  HGB 8.6*  HCT 27.1*  MCV 90.6  PLT 319   Cardiac Enzymes: No results for input(s): "CKTOTAL", "CKMB", "CKMBINDEX", "TROPONINI" in the last 168 hours.  BNP (last 3 results) Recent Labs    11/25/22 1101  BNP 695.7*    ProBNP (last 3 results) No results for input(s): "PROBNP" in the last 8760 hours.  CBG: Recent Labs  Lab 02/14/23 1922 02/14/23 2017  GLUCAP 68* 105*    Radiological Exams on Admission: CT ABDOMEN PELVIS WO CONTRAST  Result Date: 02/14/2023 CLINICAL DATA:  Nausea, vomiting, diarrhea that increased yesterday. EXAM: CT ABDOMEN AND PELVIS WITHOUT CONTRAST TECHNIQUE: Multidetector CT imaging of the abdomen and pelvis was performed following the standard protocol without IV contrast. RADIATION DOSE REDUCTION: This exam was performed according to the departmental dose-optimization program which includes automated exposure control, adjustment of the mA and/or kV according to patient size and/or use of iterative reconstruction technique. COMPARISON:  02/10/2023 FINDINGS: Lower chest: Interlobular septal thickening in the lower lungs. Scattered centrilobular micro nodules. More dense consolidation in the right middle lobe. Small right and trace left pleural effusions. Coronary artery calcification. Hepatobiliary: Unremarkable noncontrast appearance of the liver. Cholelithiasis. No evidence of cholecystitis. Pancreas: Unremarkable. Spleen: Unremarkable. Adrenals/Urinary Tract: Normal adrenal glands. Increased perinephric stranding bilaterally compared with 02/10/2023. No urinary calculi or hydronephrosis. Unremarkable bladder. Stomach/Bowel: Normal caliber large and small bowel. Borderline wall thickening of the sigmoid colon and rectum. Stomach is within normal limits. Vascular/Lymphatic: Aortic atherosclerosis. No enlarged  abdominal or pelvic lymph nodes. Reproductive: Unremarkable. Other: Mild mesenteric edema and trace free fluid in the pelvis. No free intraperitoneal air. Musculoskeletal: Body wall edema.  No acute osseous abnormality. IMPRESSION: 1. Possible mild proctocolitis. 2. Increased nonspecific perinephric stranding compared to 02/10/2023. This may be related to fluid status/heart failure given pulmonary, mesenteric and body wall edema. 3. Interstitial pulmonary edema with small right and trace left pleural effusions. 4. Consolidation in the right middle lobe may reflect atelectasis or pneumonia. Aortic Atherosclerosis (ICD10-I70.0). Electronically Signed   By: Minerva Fester M.D.   On: 02/14/2023 20:51    EKG: I independently viewed the EKG done and my findings are as followed: Normal sinus rhythm at a rate of 79 bpm with nonspecific ST-T wave changes  Assessment/Plan Present on Admission:  High anion gap metabolic acidosis  AKI (acute kidney injury)  Acute hyponatremia  Hypoalbuminemia due to protein-calorie malnutrition  COPD (chronic obstructive pulmonary disease)  Essential hypertension  Mixed hyperlipidemia  GERD without esophagitis  Acquired hypothyroidism  BPH (benign prostatic hyperplasia)  Principal Problem:   High anion gap metabolic acidosis Active Problems:   Uncontrolled type 2 diabetes mellitus with hypoglycemia, with long-term current use of insulin   Essential hypertension   Mixed hyperlipidemia   BPH (benign prostatic hyperplasia)   Acute hyponatremia   COPD (chronic obstructive pulmonary disease)   GERD without esophagitis   AKI (acute kidney injury)   Acquired hypothyroidism   Hypoalbuminemia due to protein-calorie malnutrition   Hyperkalemia   Pulmonary edema   Bilateral pleural effusion   Nausea & vomiting   Chronic diarrhea   Obesity (BMI 30-39.9)  High anion gap metabolic acidosis Anion gap 20 > 23, CO2  15 > 11 1 amp of bicarb and bicarb drip with 2 L  of NS  over 4 hours per nephrologist recommendation This is possibly secondary to patient's acute kidney injury Nephrologist consult was appreciated  Acute kidney injury BUN/creatinine 78/6.03 > 80/6.38 indicating possible intrinsic renal disease as a differential diagnosis Continue IV NS per nephrologist recommendation US renal ultrasound in the morning Urinalysis pending Urine sodium, urine creatinine pending Continue intake and outputs, daily weights Neurologist consult was appreciated  Hyperkalemia K+ 6.9 > 6.3 Calcium gluconate was given Insulin and D50 was given Albuterol breathing treatment was provided Continue Lokelma every 8 hours per nephrologist recommendations Continue to monitor potassium levels  Pulmonary edema/ Bilateral pleural effusion rule out CHF CT abdomen and pelvis was suggestive of pulmonary edema, right small pleural effusion Left trace pleural effusion Continue total input/output, daily weights and fluid restriction Diuretics will be temporarily held at this time due to soft BP and due to current IV hydration in the setting of acute kidney injury per nephrologist recommendation Continue heart healthy diet  Echocardiogram done on 10/13/2022 showed LVEF of 55 to 60%.  No RWMA.  Moderate concentric LVH.  LV diastolic parameters are indeterminate.  Echocardiogram will be done in the morning   Possible atelectasis/CAP POA CT abdomen and pelvis was suggestive of atelectasis or pneumonia Continue incentive spirometry Procalcitonin will be checked prior to starting patient on antibiotics  Hyponatremia Na+ 131, continue gentle hydration Urine osmolality, serum osmolality, urine sodium pending  Nausea and vomiting Continue Compazine as needed  Chronic diarrhea Patient has not had any bowel movement since arrival to the ED Consider C.Diff and GI stool panel if diarrhea continues  Hypoalbuminemia possibly secondary to mild protein calorie malnutrition Albumin 3.3,  protein supplement will be provided  Essential hypertension BP meds will be held at this time due to soft BP  Mixed hyperlipidemia Continue Lipitor  Type 2 diabetes mellitus with hypoglycemia Hemoglobin A1c on 11/07/2022 was 6.1 Continue ISS and hypoglycemic protocol Metformin will be held at this time  COPD Continue DuoNeb as needed  GERD Protonix will be held at this time due to acute kidney injury  Acquired hypothyroidism Continue Synthroid  BPH Continue Flomax  Obesity (BMI 34.56) Diet and lifestyle modification  DVT prophylaxis: Heparin subcu  Advance Care Planning: Full code  Consults: Nephrology  Family Communication: None at bedside  Severity of Illness: The appropriate patient status for this patient is INPATIENT. Inpatient status is judged to be reasonable and necessary in order to provide the required intensity of service to ensure the patient's safety. The patient's presenting symptoms, physical exam findings, and initial radiographic and laboratory data in the context of their chronic comorbidities is felt to place them at high risk for further clinical deterioration. Furthermore, it is not anticipated that the patient will be medically stable for discharge from the hospital within 2 midnights of admission.   * I certify that at the point of admission it is my clinical judgment that the patient will require inpatient hospital care spanning beyond 2 midnights from the point of admission due to high intensity of service, high risk for further deterioration and high frequency of surveillance required.*  Author: Frankey Shown, DO 02/15/2023 2:23 AM  For on call review [email or web address].

## 2023-02-14 NOTE — ED Provider Notes (Signed)
[place] EMERGENCY DEPARTMENT AT [facility] Provider Note   CSN: [id number] Arrival date & time: 02/14/23  1505     History  Chief Complaint  Patient presents with   Nausea   Diarrhea    Shane Chambers is a 62 y.o. male.   Diarrhea Associated symptoms: abdominal pain, chills and vomiting   Associated symptoms: no fever and no headaches        Shane Chambers is a 62 y.o. male with past medical history of hypertension, type 2 diabetes, BPH, anemia, ischemic cardiomyopathy, COPD, rectal cancer and status post CABG x 5 who comes in from EMS from [place] assisted living facility who presents to the Emergency Department complaining of chronic diarrhea, vomiting today, abdominal pain yesterday.  He reports diarrhea for greater than 1 month describes having "mushy" stools he is unable to describe color, watery stools yesterday and 3 episodes of vomiting today.  Denies abdominal pain currently, feels as he is having chills but no known fever. He also endorses feeling "like I have bronchitis" admits to occasional cough, mild shortness of breath.    Home Medications Prior to Admission medications   Medication Sig Start Date End Date Taking? Authorizing Provider  acetaminophen (TYLENOL) 500 MG tablet Take 1,000 mg by mouth every 8 (eight) hours as needed for fever or moderate pain.    [provider]  albuterol (VENTOLIN HFA) 108 (90 Base) MCG/ACT inhaler Inhale 2 puffs into the lungs every 6 (six) hours as needed for wheezing or shortness of breath. 05/02/22   Johnson, Clanford L, MD  alum & mag hydroxide-simeth (MAALOX/MYLANTA) 200-200-20 MG/5ML suspension Take 30 mLs by mouth every 6 (six) hours as needed for indigestion or heartburn. 10/17/22   Ghimire, Werner Lean, MD  amLODipine (NORVASC) 10 MG tablet Take 1 tablet (10 mg total) by mouth daily. 10/18/22   Ghimire, Werner Lean, MD  ascorbic acid (VITAMIN C) 500 MG tablet Take 1 tablet (500 mg total) by mouth 2 (two)  times daily. 11/28/22 02/26/23  Gillis Santa, MD  aspirin 81 MG EC tablet Take 1 tablet (81 mg total) by mouth daily with breakfast. 11/29/20   Shon Hale, MD  atorvastatin (LIPITOR) 40 MG tablet Take 1 tablet (40 mg total) by mouth at bedtime. 11/28/22   Gillis Santa, MD  barrier cream (NON-SPECIFIED) CREA Apply 1 Application topically in the morning, at noon, and at bedtime. Every shift apply to buttocks    [provider]  budesonide-formoterol (SYMBICORT) 160-4.5 MCG/ACT inhaler Inhale 2 puffs into the lungs in the morning and at bedtime. 05/24/20   Oretha Milch, MD  Cholecalciferol 125 MCG (5000 UT) TABS Take 1 tablet by mouth every Monday.    [provider]  clotrimazole-betamethasone (LOTRISONE) cream Apply 1 Application topically 2 (two) times daily.    [provider]  ferrous gluconate (FERGON) 324 MG tablet Take 324 mg by mouth daily.    [provider]  gabapentin (NEURONTIN) 300 MG capsule Take 2 capsules (600 mg total) by mouth 2 (two) times daily. 11/28/22   Gillis Santa, MD  Glucerna [facility]) LIQD Take 1 Can by mouth with breakfast, with lunch, and with evening meal.    [provider]  insulin aspart (NOVOLOG FLEXPEN) 100 UNIT/ML FlexPen 0-15 Units, Subcutaneous, 3 times daily with meals CBG < 70: implement hypoglycemia protocol-call MD CBG 70 - 120: 0 units CBG 121 - 150: 2 units CBG 151 - 200: 3 units CBG 201 - 250: 5  units CBG 251 - 300: 8 units CBG 301 - 350: 11 units CBG 351 - 400: 15 units CBG > 400: 10/17/22   Ghimire, Werner Lean, MD  ipratropium-albuterol (DUONEB) 0.5-2.5 (3) MG/3ML SOLN Take 3 mLs by nebulization every 6 (six) hours as needed. 10/17/22   Ghimire, Werner Lean, MD  levothyroxine (SYNTHROID) 75 MCG tablet Take 75 mcg by mouth daily before breakfast.    [provider]  lurasidone (LATUDA) 20 MG TABS tablet Take 20 mg by mouth every evening.    [provider]  magnesium oxide (MAG-OX) 400  MG tablet Take 400 mg by mouth daily.    [provider]  metFORMIN (GLUCOPHAGE) 1000 MG tablet Take 1,000 mg by mouth 2 (two) times daily.    [provider]  metoprolol tartrate (LOPRESSOR) 50 MG tablet Take 1 tablet (50 mg total) by mouth 2 (two) times daily. 11/28/22   Gillis Santa, MD  Multiple Vitamin (MULTIVITAMIN) tablet Take 1 tablet by mouth daily.    [provider]  omeprazole (PRILOSEC) 40 MG capsule Take 40 mg by mouth daily. 01/01/22   [provider]  oxyCODONE-acetaminophen (PERCOCET/ROXICET) 5-325 MG tablet Take 1 tablet by mouth every 4 (four) hours as needed. 12/19/22   Nadara Mustard, MD  polyethylene glycol (MIRALAX / GLYCOLAX) 17 g packet Take 17 g by mouth daily as needed for moderate constipation.    [provider]  polyvinyl alcohol (LIQUIFILM TEARS) 1.4 % ophthalmic solution Place 1 drop into both eyes daily.    [provider]  potassium chloride SA (KLOR-CON M) 20 MEQ tablet Take 1 tablet (20 mEq total) by mouth daily. 05/05/22   Johnson, Clanford L, MD  senna-docusate (SENOKOT-S) 8.6-50 MG tablet Take 1 tablet by mouth 2 (two) times daily. 10/17/22   Ghimire, Werner Lean, MD  tamsulosin (FLOMAX) 0.4 MG CAPS capsule Take 0.8 mg by mouth every evening.    [provider]  thiamine (VITAMIN B-1) 100 MG tablet Take 1 tablet (100 mg total) by mouth daily. 10/18/22   Ghimire, Werner Lean, MD  torsemide (DEMADEX) 20 MG tablet Take 2 tablets (40 mg total) by mouth daily. 10/17/22   Ghimire, Werner Lean, MD  venlafaxine XR (EFFEXOR-XR) 150 MG 24 hr capsule Take 1 capsule (150 mg total) by mouth daily with breakfast. 11/29/22   Gillis Santa, MD  Vitamin D, Ergocalciferol, (DRISDOL) 1.25 MG (50000 UNIT) CAPS capsule Take 1 capsule (50,000 Units total) by mouth every 7 (seven) days. 12/03/22 03/03/23  Gillis Santa, MD      Allergies    Ace inhibitors, Haloperidol and related, Other, Shellfish allergy, and Penicillins    Review of  Systems   Review of Systems  Constitutional:  Positive for chills. Negative for appetite change and fever.  HENT:  Negative for trouble swallowing.   Respiratory:  Negative for shortness of breath.   Cardiovascular:  Negative for chest pain and leg swelling.  Gastrointestinal:  Positive for abdominal pain, diarrhea, nausea and vomiting.  Genitourinary:  Negative for difficulty urinating and dysuria.  Neurological:  Negative for dizziness, weakness, numbness and headaches.    Physical Exam Updated Vital Signs BP (!) 111/59   Pulse 65   Temp 98 F (36.7 C)   Resp 20   Ht 5\' 9"  (1.753 m)   Wt 106.1 kg   SpO2 100%   BMI 34.56 kg/m  Physical Exam Vitals and nursing note reviewed.  Constitutional:      Appearance: Normal appearance.  HENT:     Mouth/Throat:     Mouth: Mucous membranes are moist.     Pharynx: Oropharynx is clear.  Cardiovascular:     Rate and Rhythm: Normal rate and regular rhythm.     Pulses: Normal pulses.  Pulmonary:     Effort: Pulmonary effort is normal. No respiratory distress.     Breath sounds: Normal breath sounds. No wheezing or rhonchi.  Abdominal:     General: There is no distension.     Palpations: Abdomen is soft.     Tenderness: There is no abdominal tenderness.  Musculoskeletal:        General: No swelling.     Left lower leg: No edema.     Comments: Right BKA.  Incision at the stump is well-healed.  No surrounding erythema.  There i localized area on the medial aspect of the upper leg that appears excoriated.  Localized area of erythema lateral left lower leg also with areas of excoriation.  No open wounds or drainage.  No lymphangitis  Skin:    Capillary Refill: Capillary refill takes less than 2 seconds.  Neurological:     Sensory: No sensory deficit.     Motor: No weakness.     ED Results / Procedures / Treatments   Labs (all labs ordered are listed, but only abnormal results are displayed) Labs Reviewed  CBC WITH  DIFFERENTIAL/PLATELET - Abnormal; Notable for the following components:      Result Value   WBC 11.0 (*)    RBC 2.99 (*)    Hemoglobin 8.6 (*)    HCT 27.1 (*)    Neutro Abs 9.2 (*)    All other components within normal limits  COMPREHENSIVE METABOLIC PANEL - Abnormal; Notable for the following components:   Sodium 131 (*)    Potassium 6.9 (*)    Chloride 96 (*)    CO2 15 (*)    Glucose, Bld 69 (*)    BUN 78 (*)    Creatinine, Ser 6.03 (*)    Albumin 3.3 (*)    GFR, Estimated 10 (*)    Anion gap 20 (*)    All other components within normal limits  BASIC METABOLIC PANEL - Abnormal; Notable for the following components:   Sodium 132 (*)    Potassium 6.3 (*)    CO2 11 (*)    Glucose, Bld 107 (*)    BUN 80 (*)    Creatinine, Ser 6.38 (*)    GFR, Estimated 9 (*)    Anion gap 23 (*)    All other components within normal limits  CBG MONITORING, ED - Abnormal; Notable for the following components:   Glucose-Capillary 68 (*)    All other components within normal limits  CBG MONITORING, ED - Abnormal; Notable for the following components:   Glucose-Capillary 105 (*)    All other components within normal limits  LIPASE, BLOOD  URINALYSIS, ROUTINE W REFLEX MICROSCOPIC  TROPONIN I (HIGH SENSITIVITY)    EKG EKG Interpretation  Date/Time:  Saturday February 14 2023 22:24:37 EDT Ventricular Rate:  79 PR Interval:  174 QRS Duration: 120 QT Interval:  434 QTC Calculation: 498 R Axis:   52 Text Interpretation: Sinus rhythm Nonspecific intraventricular conduction delay Non-specific ST-t changes Confirmed by Cathren Laine ([id number]) on 02/14/2023 10:35:13 PM  Radiology CT ABDOMEN PELVIS WO CONTRAST  Result Date: 02/14/2023 CLINICAL DATA:  Nausea, vomiting, diarrhea that increased yesterday. EXAM: CT ABDOMEN AND PELVIS WITHOUT CONTRAST TECHNIQUE: Multidetector CT imaging  of the abdomen and pelvis was performed following the standard protocol without IV contrast. RADIATION DOSE REDUCTION: This  exam was performed according to the departmental dose-optimization program which includes automated exposure control, adjustment of the mA and/or kV according to patient size and/or use of iterative reconstruction technique. COMPARISON:  02/10/2023 FINDINGS: Lower chest: Interlobular septal thickening in the lower lungs. Scattered centrilobular micro nodules. More dense consolidation in the right middle lobe. Small right and trace left pleural effusions. Coronary artery calcification. Hepatobiliary: Unremarkable noncontrast appearance of the liver. Cholelithiasis. No evidence of cholecystitis. Pancreas: Unremarkable. Spleen: Unremarkable. Adrenals/Urinary Tract: Normal adrenal glands. Increased perinephric stranding bilaterally compared with 02/10/2023. No urinary calculi or hydronephrosis. Unremarkable bladder. Stomach/Bowel: Normal caliber large and small bowel. Borderline wall thickening of the sigmoid colon and rectum. Stomach is within normal limits. Vascular/Lymphatic: Aortic atherosclerosis. No enlarged abdominal or pelvic lymph nodes. Reproductive: Unremarkable. Other: Mild mesenteric edema and trace free fluid in the pelvis. No free intraperitoneal air. Musculoskeletal: Body wall edema.  No acute osseous abnormality. IMPRESSION: 1. Possible mild proctocolitis. 2. Increased nonspecific perinephric stranding compared to 02/10/2023. This may be related to fluid status/heart failure given pulmonary, mesenteric and body wall edema. 3. Interstitial pulmonary edema with small right and trace left pleural effusions. 4. Consolidation in the right middle lobe may reflect atelectasis or pneumonia. Aortic Atherosclerosis (ICD10-I70.0). Electronically Signed   By: Minerva Fester M.D.   On: 02/14/2023 20:51    Procedures Procedures    Medications Ordered in ED Medications - No data to display  ED Course/ Medical Decision Making/ A&P                             Medical Decision Making Patient here from  assisted living facility with complaints of nausea vomiting and diarrhea.  Also states he feels like he has bronchitis again.  Endorses chills without known fever.  "Mushy" stools for 1 month, watery stools yesterday with 3 episodes of vomiting today.  Abdominal pain yesterday none currently.  Differential would include but not limited to gastroenteritis, other viral process, C. difficile, sepsis. Sepsis and C. difficile felt less likely given patient lack of hypotension, fever or respiratory distress.  No reported antibiotics in greater than 1 month and he is afebrile  Amount and/or Complexity of Data Reviewed Labs: ordered.    Details: Labs interpreted by me, mild leukocytosis with white count of 11,000.  Hemoglobin 8.6 this is improved from 1 month ago.  Chemistries show significant hyperkalemia with potassium of 6.9.  Bicarb less than 15, blood sugar 69 BUN 78 and serum creatinine 6.03.  His creatinine was 2 one month ago. trop 11 lipase unremarkable.  Radiology: ordered.    Details: CT abdomen and pelvis ordered for further evaluation patient's symptoms.  Possible mild proctocolitis.  Increased perinephritic stranding.  4-24 possibly related to pulmonary, mesenteric and body wall edema interstitial pulmonary edema small on the right trace on the left pleural effusions ECG/medicine tests: ordered.    Details: EKG shows sinus rhythm with nonspecific ST changes Discussion of management or test interpretation with external provider(s): Given 1L fluids here, still unable to urinate, bladder scan reveals 300 ml   Consulted [facility] hospitalists who requests consult with nephrology   Consulted nephrology, Dr. Arlean Hopping, who recommended patient be given an amp of bicarb now and started on a bicarb drip with 2 L of saline over the next 4 hours, renal ultrasound in the morning, urinalysis which  is pending, urine sodium and urine creatinine with repeat potassium level in 4 to 6 hours, Lokelma every 8 hours,  daily weights and measured intake and outputs.  Felt the patient appropriate [facility] and does not need emergent dialysis at this time  [facility] Dr. Thomes DinningAdefeso who is agreeable to admit  Risk OTC drugs. Prescription drug management. Decision regarding hospitalization.    CRITICAL CARE For hyperkalemia, multiple parenteral medications and hospital admission Performed by: Llewyn Heap Total critical care time: 35 minutes Critical care time was exclusive of separately billable procedures and treating other patients. Critical care was necessary to treat or prevent imminent or life-threatening deterioration. Critical care was time spent personally by me on the following activities: development of treatment plan with patient and/or surrogate as well as nursing, discussions with consultants, evaluation of patient's response to treatment, examination of patient, obtaining history from patient or surrogate, ordering and performing treatments and interventions, ordering and review of laboratory studies, ordering and review of radiographic studies, pulse oximetry and re-evaluation of patient's condition.          Final Clinical Impression(s) / ED Diagnoses Final diagnoses:  Metabolic acidosis  Hyperkalemia  Congestive heart failure, unspecified HF chronicity, unspecified heart failure type    Rx / DC Orders ED Discharge Orders     None         Pauline Ausriplett, Raia Amico, PA-C 02/14/23 2327    Cathren LaineSteinl, Kevin, MD 02/15/23 1454

## 2023-02-14 NOTE — ED Notes (Signed)
Pt aware we need urine sample. Cannot go at this time. Applied external cath

## 2023-02-14 NOTE — ED Notes (Signed)
Lab at bedside

## 2023-02-14 NOTE — ED Notes (Signed)
ED TO INPATIENT HANDOFF REPORT  ED Nurse Name and Phone #: Efraim Kaufmann, RN  S Name/Age/Gender Shane Chambers 62 y.o. male Room/Bed: APA15/APA15  Code Status   Code Status: Prior  Home/SNF/Other Skilled nursing facility Patient oriented to: self, place, time, and situation Is this baseline? Yes   Triage Complete: Triage complete  Chief Complaint High anion gap metabolic acidosis [E87.29]  Triage Note Patient complains of N/V/D that increased yesterday. He has had diarrhea over the last month. He had 3 episodes of vomiting today    Allergies Allergies  Allergen Reactions   Ace Inhibitors Swelling and Cough    (Not on MAR at [name])   Haloperidol And Related     Do NOT give anti-psychotics due to risk of  Torsades and QT prolongation (per Dr. Teena Irani request)   Other Itching    [name]   Shellfish Allergy     Listed on MAR   Penicillins Itching and Rash    Has patient had a PCN reaction causing immediate rash, facial/tongue/throat swelling, SOB or lightheadedness with hypotension: Unknown Has patient had a PCN reaction causing severe rash involving mucus membranes or skin necrosis: Unknown Has patient had a PCN reaction that required hospitalization: Unknown Has patient had a PCN reaction occurring within the last 10 years: Unknown If all of the above answers are "NO", then may proceed with Cephalosporin use.  Tolerated ancef (12-17-22)     Level of Care/Admitting Diagnosis ED Disposition     ED Disposition  Admit   Condition  --   Comment  Hospital Area: [facility] [[id number]]  Level of Care: Stepdown [14]  Covid Evaluation: Asymptomatic - no recent exposure (last 10 days) testing not required  Diagnosis: High anion gap metabolic acidosis [599357]  Admitting Physician: Frankey Shown [[id number]]  Attending Physician: Frankey Shown [[id number]]  Certification:: I certify this patient will need inpatient services for  at least 2 midnights  Estimated Length of Stay: 3          B Medical/Surgery History Past Medical History:  Diagnosis Date   Anemia    Arthritis    CAD (coronary artery disease)    a. s/p CABG in 03/2020 with LIMA-LAD, RIMA-PL, RA-D1-RI-OM1   Cancer    rectal   Cellulitis    COPD (chronic obstructive pulmonary disease)    Depression    Diabetes mellitus without complication    GERD (gastroesophageal reflux disease)    History of kidney stones    Hyperlipidemia 12/01/2019   Hypertension    Hypothyroidism    Ischemic cardiomyopathy    a. EF 20-25% by echo in 02/2020 b. at 40% by echo in 03/2020 c. EF normalized to 60-65% by echo in 04/2020   Myocardial infarction    Peripheral vascular disease    Sleep apnea    Type 2 diabetes mellitus    Past Surgical History:  Procedure Laterality Date   AMPUTATION Right 10/05/2022   Procedure: AMPUTATION BELOW KNEE;  Surgeon: Nadara Mustard, MD;  Location: [facility] OR;  Service: Orthopedics;  Laterality: Right;   AMPUTATION Right 11/14/2022   Procedure: AMPUTATION BELOW KNEE REVISION; WASHOUT;  Surgeon: Renford Dills, MD;  Location: [facility] ORS;  Service: Vascular;  Laterality: Right;   AMPUTATION Right 11/18/2022   Procedure: AMPUTATION BELOW KNEE REVISION AND CLOSURE;  Surgeon: Renford Dills, MD;  Location: [facility] ORS;  Service: Vascular;  Laterality: Right;   APPLICATION OF WOUND VAC Right 10/05/2022  Procedure: APPLICATION OF WOUND VAC;  Surgeon: Nadara Mustard, MD;  Location: [facility] OR;  Service: Orthopedics;  Laterality: Right;   BIOPSY  07/18/2021   Procedure: BIOPSY;  Surgeon: Lanelle Bal, DO;  [facility];  Service: Endoscopy;;   BIOPSY  01/09/2022   Procedure: BIOPSY;  Surgeon: Lanelle Bal, DO;  [facility];  Service: Endoscopy;;   COLONOSCOPY WITH PROPOFOL N/A 07/18/2021   Carver: 15 millimeter polyp removed from the sigmoid colon, 5 mm polyp removed from sigmoid colon.  Nonbleeding internal  hemorrhoids.  Significant looping of the colon. sigmoid path showed invasive colonic adenocarcinoma involving tubular adenoma (invades to depth of 2mm, carcinoma 1mm from margin, no lymphovascular invasion, no poorly differentiated component.   CORONARY ARTERY BYPASS GRAFT N/A 03/13/2020   Procedure: CORONARY ARTERY BYPASS GRAFTING (CABG) times five using bilateral Internal mammary arteries and left radial artery;  Surgeon: Linden Dolin, MD;  Location: [facility] OR;  Service: Open Heart Surgery;  Laterality: N/A;   DENTAL SURGERY     ESOPHAGOGASTRODUODENOSCOPY (EGD) WITH PROPOFOL N/A 07/18/2021   Carver: 1 gastric polyp status post biopsy, gastritis. gastric bx with slight chronic inflammation and no H.pyori. GEJ polypectomy with mild inflammation only   FLEXIBLE SIGMOIDOSCOPY N/A 08/26/2021   Carver: Nonbleeding internal hemorrhoids.  15 mm ulcers from previous polypectomy found in the rectum.  No evidence of previous polyp.  Located 5 to 8 cm from anal verge.   FLEXIBLE SIGMOIDOSCOPY N/A 01/09/2022   Procedure: FLEXIBLE SIGMOIDOSCOPY;  Surgeon: Lanelle Bal, DO;  [facility];  Service: Endoscopy;  Laterality: N/A;   POLYPECTOMY  07/18/2021   Procedure: POLYPECTOMY INTESTINAL;  Surgeon: Lanelle Bal, DO;  [facility];  Service: Endoscopy;;   RADIAL ARTERY HARVEST Left 03/13/2020   Procedure: RADIAL ARTERY HARVEST,;  Surgeon: Linden Dolin, MD;  Location: [facility] OR;  Service: Open Heart Surgery;  Laterality: Left;   RIGHT/LEFT HEART CATH AND CORONARY ANGIOGRAPHY N/A 03/07/2020   Procedure: RIGHT/LEFT HEART CATH AND CORONARY ANGIOGRAPHY;  Surgeon: Tonny Bollman, MD;  Location: [facility] INVASIVE CV LAB;  Service: Cardiovascular;  Laterality: N/A;   STUMP REVISION Right 12/17/2022   Procedure: RIGHT BELOW KNEE AMPUTATION REVISION;  Surgeon: Nadara Mustard, MD;  Location: [facility] OR;  Service: Orthopedics;  Laterality: Right;   SUBMUCOSAL LIFTING INJECTION  01/09/2022   Procedure:  SUBMUCOSAL LIFTING INJECTION;  Surgeon: Lanelle Bal, DO;  [facility];  Service: Endoscopy;;   SUBMUCOSAL TATTOO INJECTION  01/09/2022   Procedure: SUBMUCOSAL TATTOO INJECTION;  Surgeon: Lanelle Bal, DO;  [facility];  Service: Endoscopy;;   TEE WITHOUT CARDIOVERSION N/A 03/13/2020   Procedure: TRANSESOPHAGEAL ECHOCARDIOGRAM (TEE);  Surgeon: Linden Dolin, MD;  Location: [facility] OR;  Service: Open Heart Surgery;  Laterality: N/A;     A IV Location/Drains/Wounds Patient Lines/Drains/Airways Status     Active Line/Drains/Airways     Name Placement date Placement time Site Days   Peripheral IV 02/14/23 18 G Left Antecubital 02/14/23  1938  Antecubital  less than 1   Negative Pressure Wound Therapy Knee Anterior;Right 12/17/22  1000  --  59   External Urinary Catheter 02/14/23  1618  --  less than 1   Pressure Injury 11/06/22 Buttocks Right Stage 2 -  Partial thickness loss of dermis presenting as a shallow open injury with a red, pink wound bed without slough. no odor. no drainage. no slough. 1cm X 0.3cm 11/06/22  1800  -- 100   Wound / Incision (Open or Dehisced) 09/25/22 Irritant Dermatitis (Moisture Associated Skin Damage) Thigh Left;Posterior;Proximal;Right Yeast, redness, excoriation 09/25/22  2200  Thigh  142   Wound / Incision (Open or Dehisced) 11/06/22 Non-pressure wound Pretibial Distal;Left scabs/abrasions 11/06/22  1800  Pretibial  100   Wound / Incision (Open or Dehisced) 12/17/22 Irritant Dermatitis (Moisture Associated Skin Damage) Buttocks Right;Left pink/red 12/17/22  1251  Buttocks  59            Intake/Output Last 24 hours  Intake/Output Summary (Last 24 hours) at 02/14/2023 2332 Last data filed at 02/14/2023 2025 Gross per 24 hour  Intake 1050 ml  Output --  Net 1050 ml    Labs/Imaging Results for orders placed or performed during the hospital encounter of 02/14/23 (from the past 48 hour(s))  CBC with Differential     Status:  Abnormal   Collection Time: 02/14/23  4:40 PM  Result Value Ref Range   WBC 11.0 (H) 4.0 - 10.5 K/uL   RBC 2.99 (L) 4.22 - 5.81 MIL/uL   Hemoglobin 8.6 (L) 13.0 - 17.0 g/dL   HCT 37.1 (L) 06.2 - 69.4 %   MCV 90.6 80.0 - 100.0 fL   MCH 28.8 26.0 - 34.0 pg   MCHC 31.7 30.0 - 36.0 g/dL   RDW 85.4 62.7 - 03.5 %   Platelets 319 150 - 400 K/uL   nRBC 0.0 0.0 - 0.2 %   Neutrophils Relative % 84 %   Neutro Abs 9.2 (H) 1.7 - 7.7 K/uL   Lymphocytes Relative 8 %   Lymphs Abs 0.9 0.7 - 4.0 K/uL   Monocytes Relative 6 %   Monocytes Absolute 0.7 0.1 - 1.0 K/uL   Eosinophils Relative 1 %   Eosinophils Absolute 0.1 0.0 - 0.5 K/uL   Basophils Relative 0 %   Basophils Absolute 0.0 0.0 - 0.1 K/uL   Immature Granulocytes 1 %   Abs Immature Granulocytes 0.05 0.00 - 0.07 K/uL    Comment: Performed at [facility], [address], [place], [place] [address]  Comprehensive metabolic panel     Status: Abnormal   Collection Time: 02/14/23  5:55 PM  Result Value Ref Range   Sodium 131 (L) 135 - 145 mmol/L   Potassium 6.9 (HH) 3.5 - 5.1 mmol/L    Comment: CRITICAL RESULT CALLED TO, READ BACK BY AND VERIFIED WITH FLETCHER, A AT 1823 ON 02/14/23 BY SMN.   Chloride 96 (L) 98 - 111 mmol/L   CO2 15 (L) 22 - 32 mmol/L   Glucose, Bld 69 (L) 70 - 99 mg/dL    Comment: Glucose reference range applies only to samples taken after fasting for at least 8 hours.   BUN 78 (H) 6 - 20 mg/dL   Creatinine, Ser 1.82 (H) 0.61 - 1.24 mg/dL   Calcium 9.0 8.9 - 99.3 mg/dL   Total Protein 7.6 6.5 - 8.1 g/dL   Albumin 3.3 (L) 3.5 - 5.0 g/dL   AST 19 15 - 41 U/L   ALT 11 0 - 44 U/L   Alkaline Phosphatase 126 38 - 126 U/L   Total Bilirubin 0.7 0.3 - 1.2 mg/dL   GFR, Estimated 10 (L) >60 mL/min    Comment: (NOTE) Calculated using the CKD-EPI Creatinine Equation (2021)    Anion gap 20 (H) 5 - 15    Comment: Performed at [facility], [address] [place], [place] [address]  Lipase, blood  Status: None   Collection  Time: 02/14/23  5:55 PM  Result Value Ref Range   Lipase 37 11 - 51 U/L    Comment: Performed at [facility], [address], [place], [place] [address]  CBG monitoring, ED     Status: Abnormal   Collection Time: 02/14/23  7:22 PM  Result Value Ref Range   Glucose-Capillary 68 (L) 70 - 99 mg/dL    Comment: Glucose reference range applies only to samples taken after fasting for at least 8 hours.  CBG monitoring, ED     Status: Abnormal   Collection Time: 02/14/23  8:17 PM  Result Value Ref Range   Glucose-Capillary 105 (H) 70 - 99 mg/dL    Comment: Glucose reference range applies only to samples taken after fasting for at least 8 hours.  Basic metabolic panel     Status: Abnormal   Collection Time: 02/14/23 10:07 PM  Result Value Ref Range   Sodium 132 (L) 135 - 145 mmol/L   Potassium 6.3 (HH) 3.5 - 5.1 mmol/L    Comment: CRITICAL RESULT CALLED TO, READ BACK BY AND VERIFIED WITH WALKER, T AT 2313 ON 02/14/23 BY SMN.   Chloride 98 98 - 111 mmol/L   CO2 11 (L) 22 - 32 mmol/L   Glucose, Bld 107 (H) 70 - 99 mg/dL    Comment: Glucose reference range applies only to samples taken after fasting for at least 8 hours.   BUN 80 (H) 6 - 20 mg/dL   Creatinine, Ser 1.91 (H) 0.61 - 1.24 mg/dL   Calcium 8.9 8.9 - 47.8 mg/dL   GFR, Estimated 9 (L) >60 mL/min    Comment: (NOTE) Calculated using the CKD-EPI Creatinine Equation (2021)    Anion gap 23 (H) 5 - 15    Comment: ELECTROLYTES REPEATED TO VERIFY Performed at [facility], [address], [place], [place] [address]   Troponin I (High Sensitivity)     Status: None   Collection Time: 02/14/23 10:07 PM  Result Value Ref Range   Troponin I (High Sensitivity) 11 <18 ng/L    Comment: (NOTE) Elevated high sensitivity troponin I (hsTnI) values and significant  changes across serial measurements may suggest ACS but many other  chronic and acute conditions are known to elevate hsTnI results.  Refer to the "Links" section for chest pain algorithms  and additional  guidance. Performed at [facility], [address], [place], [place] [address]    CT ABDOMEN PELVIS WO CONTRAST  Result Date: 02/14/2023 CLINICAL DATA:  Nausea, vomiting, diarrhea that increased yesterday. EXAM: CT ABDOMEN AND PELVIS WITHOUT CONTRAST TECHNIQUE: Multidetector CT imaging of the abdomen and pelvis was performed following the standard protocol without IV contrast. RADIATION DOSE REDUCTION: This exam was performed according to the departmental dose-optimization program which includes automated exposure control, adjustment of the mA and/or kV according to patient size and/or use of iterative reconstruction technique. COMPARISON:  02/10/2023 FINDINGS: Lower chest: Interlobular septal thickening in the lower lungs. Scattered centrilobular micro nodules. More dense consolidation in the right middle lobe. Small right and trace left pleural effusions. Coronary artery calcification. Hepatobiliary: Unremarkable noncontrast appearance of the liver. Cholelithiasis. No evidence of cholecystitis. Pancreas: Unremarkable. Spleen: Unremarkable. Adrenals/Urinary Tract: Normal adrenal glands. Increased perinephric stranding bilaterally compared with 02/10/2023. No urinary calculi or hydronephrosis. Unremarkable bladder. Stomach/Bowel: Normal caliber large and small bowel. Borderline wall thickening of the sigmoid colon and rectum. Stomach is within normal limits. Vascular/Lymphatic: Aortic atherosclerosis. No enlarged abdominal or pelvic lymph  nodes. Reproductive: Unremarkable. Other: Mild mesenteric edema and trace free fluid in the pelvis. No free intraperitoneal air. Musculoskeletal: Body wall edema.  No acute osseous abnormality. IMPRESSION: 1. Possible mild proctocolitis. 2. Increased nonspecific perinephric stranding compared to 02/10/2023. This may be related to fluid status/heart failure given pulmonary, mesenteric and body wall edema. 3. Interstitial pulmonary edema with small right and  trace left pleural effusions. 4. Consolidation in the right middle lobe may reflect atelectasis or pneumonia. Aortic Atherosclerosis (ICD10-I70.0). Electronically Signed   By: Minerva Festeryler  Stutzman M.D.   On: 02/14/2023 20:51    Pending Labs Unresulted Labs (From admission, onward)     Start     Ordered   02/14/23 1550  Urinalysis, Routine w reflex microscopic -Urine, Clean Catch  (ED Abdominal Pain)  Once,   URGENT       Question:  Specimen Source  Answer:  Urine, Clean Catch   02/14/23 1622            Vitals/Pain Today's Vitals   02/14/23 2145 02/14/23 2200 02/14/23 2209 02/14/23 2230  BP:  118/66  (!) 110/45  Pulse: 69 66  78  Resp: 20 19  (!) 21  Temp:   97.6 F (36.4 C)   TempSrc:   Oral   SpO2: 100% 100%  97%  Weight:      Height:      PainSc:        Isolation Precautions No active isolations  Medications Medications  sodium bicarbonate 150 mEq in dextrose 5 % 1,150 mL infusion (has no administration in time range)  sodium chloride 0.9 % bolus 2,000 mL (2,000 mLs Intravenous New Bag/Given 02/14/23 2317)  metoCLOPramide (REGLAN) injection 10 mg (10 mg Intravenous Given 02/14/23 1940)  sodium chloride 0.9 % bolus 1,000 mL (0 mLs Intravenous Stopped 02/14/23 2025)  sodium zirconium cyclosilicate (LOKELMA) packet 10 g (10 g Oral Given 02/14/23 1941)  calcium gluconate 1 g/ 50 mL sodium chloride IVPB (0 mg Intravenous Stopped 02/14/23 1959)  albuterol (PROVENTIL) (2.5 MG/3ML) 0.083% nebulizer solution 10 mg (10 mg Nebulization Given 02/14/23 2007)  insulin aspart (novoLOG) injection 5 Units (5 Units Intravenous Given 02/14/23 1945)    And  dextrose 50 % solution 50 mL (50 mLs Intravenous Given 02/14/23 1940)  LORazepam (ATIVAN) injection 0.5 mg (0.5 mg Intravenous Given 02/14/23 2023)  sodium bicarbonate injection 50 mEq (50 mEq Intravenous Given 02/14/23 2315)    Mobility Pt has Right leg BKA     Focused Assessments Cardiac Assessment Handoff:    Lab Results  Component Value Date    CKTOTAL 49 11/28/2022   Lab Results  Component Value Date   DDIMER 1.08 (H) 03/05/2020   Does the Patient currently have chest pain? Yes   , Pulmonary Assessment Handoff:  Lung sounds: Bilateral Breath Sounds: Diminished O2 Device: Room Air      R Recommendations: See Admitting Provider Note  Report given to:   Additional Notes:

## 2023-02-14 NOTE — ED Triage Notes (Signed)
Patient complains of N/V/D that increased yesterday. He has had diarrhea over the last month. He had 3 episodes of vomiting today

## 2023-02-15 ENCOUNTER — Inpatient Hospital Stay (HOSPITAL_COMMUNITY): Payer: Medicare Other

## 2023-02-15 DIAGNOSIS — J811 Chronic pulmonary edema: Secondary | ICD-10-CM | POA: Insufficient documentation

## 2023-02-15 DIAGNOSIS — E875 Hyperkalemia: Secondary | ICD-10-CM | POA: Insufficient documentation

## 2023-02-15 DIAGNOSIS — R112 Nausea with vomiting, unspecified: Secondary | ICD-10-CM | POA: Insufficient documentation

## 2023-02-15 DIAGNOSIS — K529 Noninfective gastroenteritis and colitis, unspecified: Secondary | ICD-10-CM | POA: Insufficient documentation

## 2023-02-15 DIAGNOSIS — E8729 Other acidosis: Secondary | ICD-10-CM | POA: Diagnosis not present

## 2023-02-15 DIAGNOSIS — E669 Obesity, unspecified: Secondary | ICD-10-CM | POA: Insufficient documentation

## 2023-02-15 DIAGNOSIS — J90 Pleural effusion, not elsewhere classified: Secondary | ICD-10-CM | POA: Insufficient documentation

## 2023-02-15 LAB — COMPREHENSIVE METABOLIC PANEL
ALT: 11 U/L (ref 0–44)
AST: 21 U/L (ref 15–41)
Albumin: 3.1 g/dL — ABNORMAL LOW (ref 3.5–5.0)
Alkaline Phosphatase: 108 U/L (ref 38–126)
Anion gap: 23 — ABNORMAL HIGH (ref 5–15)
BUN: 77 mg/dL — ABNORMAL HIGH (ref 6–20)
CO2: 12 mmol/L — ABNORMAL LOW (ref 22–32)
Calcium: 8.7 mg/dL — ABNORMAL LOW (ref 8.9–10.3)
Chloride: 99 mmol/L (ref 98–111)
Creatinine, Ser: 6.24 mg/dL — ABNORMAL HIGH (ref 0.61–1.24)
GFR, Estimated: 10 mL/min — ABNORMAL LOW (ref >60)
Glucose, Bld: 150 mg/dL — ABNORMAL HIGH (ref 70–99)
Potassium: 5.9 mmol/L — ABNORMAL HIGH (ref 3.5–5.1)
Sodium: 134 mmol/L — ABNORMAL LOW (ref 135–145)
Total Bilirubin: 0.7 mg/dL (ref 0.3–1.2)
Total Protein: 6.9 g/dL (ref 6.5–8.1)

## 2023-02-15 LAB — URINALYSIS, ROUTINE W REFLEX MICROSCOPIC
Bacteria, UA: NONE SEEN
Bilirubin Urine: NEGATIVE
Glucose, UA: NEGATIVE mg/dL
Hgb urine dipstick: NEGATIVE
Ketones, ur: NEGATIVE mg/dL
Leukocytes,Ua: NEGATIVE
Nitrite: NEGATIVE
Protein, ur: >=300 mg/dL — AB
Specific Gravity, Urine: 1.014 (ref 1.005–1.030)
pH: 5 (ref 5.0–8.0)

## 2023-02-15 LAB — GLUCOSE, CAPILLARY
Glucose-Capillary: 110 mg/dL — ABNORMAL HIGH (ref 70–99)
Glucose-Capillary: 111 mg/dL — ABNORMAL HIGH (ref 70–99)
Glucose-Capillary: 120 mg/dL — ABNORMAL HIGH (ref 70–99)
Glucose-Capillary: 146 mg/dL — ABNORMAL HIGH (ref 70–99)
Glucose-Capillary: 96 mg/dL (ref 70–99)

## 2023-02-15 LAB — SODIUM, URINE, RANDOM: Sodium, Ur: 28 mmol/L

## 2023-02-15 LAB — OSMOLALITY: Osmolality: 322 mOsm/kg (ref 275–295)

## 2023-02-15 LAB — PHOSPHORUS: Phosphorus: 7.3 mg/dL — ABNORMAL HIGH (ref 2.5–4.6)

## 2023-02-15 LAB — CREATININE, URINE, RANDOM: Creatinine, Urine: 58 mg/dL

## 2023-02-15 LAB — PROCALCITONIN: Procalcitonin: 0.22 ng/mL

## 2023-02-15 LAB — MAGNESIUM: Magnesium: 1.7 mg/dL (ref 1.7–2.4)

## 2023-02-15 LAB — MRSA NEXT GEN BY PCR, NASAL: MRSA by PCR Next Gen: DETECTED — AB

## 2023-02-15 MED ORDER — SODIUM CHLORIDE 0.9 % IV SOLN
1.0000 g | INTRAVENOUS | Status: DC
  Administered 2023-02-15 – 2023-02-19 (×5): 1 g via INTRAVENOUS
  Filled 2023-02-15 (×5): qty 10

## 2023-02-15 MED ORDER — LEVOTHYROXINE SODIUM 75 MCG PO TABS
75.0000 ug | ORAL_TABLET | Freq: Every day | ORAL | Status: DC
  Administered 2023-02-15 – 2023-02-20 (×6): 75 ug via ORAL
  Filled 2023-02-15 (×6): qty 1

## 2023-02-15 MED ORDER — INSULIN ASPART 100 UNIT/ML IJ SOLN
0.0000 [IU] | Freq: Three times a day (TID) | INTRAMUSCULAR | Status: DC
  Administered 2023-02-16 – 2023-02-19 (×2): 1 [IU] via SUBCUTANEOUS

## 2023-02-15 MED ORDER — MUPIROCIN 2 % EX OINT
1.0000 | TOPICAL_OINTMENT | Freq: Two times a day (BID) | CUTANEOUS | Status: AC
  Administered 2023-02-15 – 2023-02-19 (×10): 1 via NASAL
  Filled 2023-02-15 (×4): qty 22

## 2023-02-15 MED ORDER — METOPROLOL TARTRATE 50 MG PO TABS
50.0000 mg | ORAL_TABLET | Freq: Two times a day (BID) | ORAL | Status: DC
  Administered 2023-02-15 – 2023-02-19 (×10): 50 mg via ORAL
  Filled 2023-02-15 (×11): qty 1

## 2023-02-15 MED ORDER — FERROUS SULFATE 325 (65 FE) MG PO TABS
325.0000 mg | ORAL_TABLET | Freq: Every day | ORAL | Status: DC
  Administered 2023-02-15 – 2023-02-17 (×3): 325 mg via ORAL
  Filled 2023-02-15 (×3): qty 1

## 2023-02-15 MED ORDER — ACETAMINOPHEN 325 MG PO TABS: 650.0000 mg | ORAL_TABLET | Freq: Four times a day (QID) | ORAL | Status: DC | PRN

## 2023-02-15 MED ORDER — SODIUM ZIRCONIUM CYCLOSILICATE 10 G PO PACK
10.0000 g | PACK | Freq: Three times a day (TID) | ORAL | Status: DC
  Administered 2023-02-15 (×3): 10 g via ORAL
  Filled 2023-02-15 (×3): qty 1

## 2023-02-15 MED ORDER — IPRATROPIUM-ALBUTEROL 0.5-2.5 (3) MG/3ML IN SOLN
3.0000 mL | Freq: Four times a day (QID) | RESPIRATORY_TRACT | Status: DC | PRN
  Administered 2023-02-16: 3 mL via RESPIRATORY_TRACT
  Filled 2023-02-15: qty 3

## 2023-02-15 MED ORDER — GLUCERNA SHAKE PO LIQD
237.0000 mL | Freq: Three times a day (TID) | ORAL | Status: DC
  Administered 2023-02-15 – 2023-02-16 (×4): 237 mL via ORAL

## 2023-02-15 MED ORDER — ATORVASTATIN CALCIUM 40 MG PO TABS
40.0000 mg | ORAL_TABLET | Freq: Every day | ORAL | Status: DC
  Administered 2023-02-15 – 2023-02-19 (×5): 40 mg via ORAL
  Filled 2023-02-15 (×5): qty 1

## 2023-02-15 MED ORDER — CHLORHEXIDINE GLUCONATE CLOTH 2 % EX PADS
6.0000 | MEDICATED_PAD | Freq: Every day | CUTANEOUS | Status: DC
  Administered 2023-02-15 – 2023-02-19 (×5): 6 via TOPICAL

## 2023-02-15 MED ORDER — ASPIRIN 81 MG PO TBEC
81.0000 mg | DELAYED_RELEASE_TABLET | Freq: Every day | ORAL | Status: DC
  Administered 2023-02-16 – 2023-02-19 (×4): 81 mg via ORAL
  Filled 2023-02-15 (×4): qty 1

## 2023-02-15 MED ORDER — LIDOCAINE HCL URETHRAL/MUCOSAL 2 % EX GEL
1.0000 | Freq: Once | CUTANEOUS | Status: AC
  Administered 2023-02-15: 1 via URETHRAL
  Filled 2023-02-15: qty 11

## 2023-02-15 MED ORDER — CHLORHEXIDINE GLUCONATE CLOTH 2 % EX PADS
6.0000 | MEDICATED_PAD | Freq: Every day | CUTANEOUS | Status: AC
  Administered 2023-02-16 – 2023-02-20 (×4): 6 via TOPICAL

## 2023-02-15 MED ORDER — FAMOTIDINE 20 MG PO TABS
20.0000 mg | ORAL_TABLET | Freq: Every day | ORAL | Status: DC
  Administered 2023-02-15 – 2023-02-19 (×5): 20 mg via ORAL
  Filled 2023-02-15 (×5): qty 1

## 2023-02-15 MED ORDER — ACETAMINOPHEN 650 MG RE SUPP: 650.0000 mg | Freq: Four times a day (QID) | RECTAL | Status: DC | PRN

## 2023-02-15 MED ORDER — PROCHLORPERAZINE EDISYLATE 10 MG/2ML IJ SOLN: 10.0000 mg | Freq: Four times a day (QID) | INTRAMUSCULAR | Status: DC | PRN

## 2023-02-15 MED ORDER — DOXYCYCLINE HYCLATE 100 MG PO TABS
100.0000 mg | ORAL_TABLET | Freq: Two times a day (BID) | ORAL | Status: DC
  Administered 2023-02-15 – 2023-02-19 (×10): 100 mg via ORAL
  Filled 2023-02-15 (×10): qty 1

## 2023-02-15 MED ORDER — HEPARIN SODIUM (PORCINE) 5000 UNIT/ML IJ SOLN
5000.0000 [IU] | Freq: Three times a day (TID) | INTRAMUSCULAR | Status: DC
  Administered 2023-02-15 – 2023-02-20 (×16): 5000 [IU] via SUBCUTANEOUS
  Filled 2023-02-15 (×16): qty 1

## 2023-02-15 MED ORDER — METRONIDAZOLE 500 MG PO TABS
500.0000 mg | ORAL_TABLET | Freq: Two times a day (BID) | ORAL | Status: DC
  Administered 2023-02-15 – 2023-02-19 (×10): 500 mg via ORAL
  Filled 2023-02-15 (×10): qty 1

## 2023-02-15 MED ORDER — FAMOTIDINE 20 MG PO TABS: 20.0000 mg | ORAL_TABLET | Freq: Two times a day (BID) | ORAL | Status: DC

## 2023-02-15 MED ORDER — TAMSULOSIN HCL 0.4 MG PO CAPS
0.8000 mg | ORAL_CAPSULE | Freq: Every evening | ORAL | Status: DC
  Administered 2023-02-15 – 2023-02-19 (×5): 0.8 mg via ORAL
  Filled 2023-02-15 (×5): qty 2

## 2023-02-15 MED ORDER — LOPERAMIDE HCL 2 MG PO CAPS: 2.0000 mg | ORAL_CAPSULE | Freq: Four times a day (QID) | ORAL | Status: DC | PRN

## 2023-02-15 NOTE — NC FL2 (Signed)
[place] MEDICAID FL2 LEVEL OF CARE FORM     IDENTIFICATION  Patient Name: Shane Chambers Birthdate: 1961/11/07 Sex: male Admission Date (Current Location): 02/14/2023  [facility] and [place] Number:  [employer] and Address:  [facility],  [address], [place] [address]      Provider Number: [id number]  Attending Physician Name and Address:  Shon Hale, MD  Relative Name and Phone Number:       Current Level of Care: Hospital Recommended Level of Care: Skilled Nursing Facility Prior Approval Number:    Date Approved/Denied:   PASRR Number:    Discharge Plan: SNF    Current Diagnoses: Patient Active Problem List   Diagnosis Date Noted   Hyperkalemia 02/15/2023   Pulmonary edema 02/15/2023   Bilateral pleural effusion 02/15/2023   Nausea & vomiting 02/15/2023   Chronic diarrhea 02/15/2023   Obesity (BMI 30-39.9) 02/15/2023   High anion gap metabolic acidosis 02/14/2023   S/P BKA (below knee amputation) unilateral, right 12/17/2022   MRSA infection 11/19/2022   Amputation stump infection 11/19/2022   QT prolongation 11/10/2022   Severe recurrent major depression without psychotic features 11/05/2022   Major depressive disorder with psychotic features 11/02/2022   Acute hypoactive delirium due to another medical condition 10/13/2022   Septic arthritis of right ankle 10/04/2022   Acute osteomyelitis 10/02/2022   Subacute osteomyelitis, right ankle and foot 10/02/2022   Acquired hypothyroidism 10/02/2022   Hypoalbuminemia due to protein-calorie malnutrition 10/02/2022   Cellulitis of right lower extremity 09/25/2022   Lactic acidosis 04/23/2022   Anemia of chronic disease 04/23/2022   Obesity, Class III, BMI 40-49.9 (morbid obesity) 04/23/2022   AKI (acute kidney injury) 04/22/2022   Elevated troponin 04/22/2022   Cancer of sigmoid colon 12/16/2021   Constipation 12/16/2021   GERD without esophagitis 12/16/2021   Iron deficiency  anemia 09/17/2021   Rectal cancer 09/03/2021   Bilateral lower leg cellulitis 11/22/2020   Systolic and diastolic CHF, chronic 11/22/2020   CHF exacerbation 08/01/2020   Visit for wound check 05/28/2020   OSA (obstructive sleep apnea) 05/24/2020   COPD (chronic obstructive pulmonary disease) 04/29/2020   Leukocytosis 04/29/2020   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   Ischemic cardiomyopathy    Coronary artery disease    Acute on chronic combined systolic and diastolic CHF (congestive heart failure) 03/05/2020   Acute hyponatremia 03/05/2020   Severe Vitamin D deficiency 12/02/2019   Hypokalemia 12/01/2019   Mixed hyperlipidemia 12/01/2019   BPH (benign prostatic hyperplasia) 12/01/2019   Normocytic anemia 12/01/2019   Pneumonia due to COVID-19 virus 12/01/2019   Hypoxia    SOB (shortness of breath) 11/16/2019   Severe sepsis 11/16/2019   Essential hypertension    Uncontrolled type 2 diabetes mellitus with hypoglycemia, with long-term current use of insulin    Depression    Acute bronchitis with bronchospasm 11/15/2019    Orientation RESPIRATION BLADDER Height & Weight     Self, Time, Situation, Place  Normal Incontinent Weight: 236 lb 15.9 oz (107.5 kg) Height:  5\' 9"  (175.3 cm)  BEHAVIORAL SYMPTOMS/MOOD NEUROLOGICAL BOWEL NUTRITION STATUS      Incontinent Diet (see dc summary)  AMBULATORY STATUS COMMUNICATION OF NEEDS Skin   Extensive Assist Verbally Normal                       Personal Care Assistance Level of Assistance  Bathing, Feeding, Dressing Bathing Assistance: Maximum assistance Feeding assistance: Limited assistance Dressing Assistance: Maximum  assistance     Functional Limitations Info  Sight, Hearing, Speech Sight Info: Adequate Hearing Info: Adequate Speech Info: Adequate    SPECIAL CARE FACTORS FREQUENCY                       Contractures Contractures Info: Not present    Additional Factors Info  Code Status, Allergies Code Status Info:  Full Allergies Info: Ace Inhibitors, Haloperidol And Related, Other, Shellfish Allergy, Penicillins (Ace Inhibitors, Haloperidol And Related, Other, Shellfish Allergy, Penicillins)           Current Medications (02/15/2023):  This is the current hospital active medication list Current Facility-Administered Medications  Medication Dose Route Frequency Provider Last Rate Last Admin   acetaminophen (TYLENOL) tablet 650 mg  650 mg Oral Q6H PRN Adefeso, Oladapo, DO       Or   acetaminophen (TYLENOL) suppository 650 mg  650 mg Rectal Q6H PRN Adefeso, Oladapo, DO       atorvastatin (LIPITOR) tablet 40 mg  40 mg Oral QHS Adefeso, Oladapo, DO       Chlorhexidine Gluconate Cloth 2 % PADS 6 each  6 each Topical Daily Emokpae, Courage, MD   6 each at 02/15/23 0928   feeding supplement (GLUCERNA SHAKE) (GLUCERNA SHAKE) liquid 237 mL  237 mL Oral TID BM Adefeso, Oladapo, DO   237 mL at 02/15/23 0928   heparin injection 5,000 Units  5,000 Units Subcutaneous Q8H Adefeso, Oladapo, DO   5,000 Units at 02/15/23 0546   insulin aspart (novoLOG) injection 0-6 Units  0-6 Units Subcutaneous TID WC Adefeso, Oladapo, DO       ipratropium-albuterol (DUONEB) 0.5-2.5 (3) MG/3ML nebulizer solution 3 mL  3 mL Nebulization Q6H PRN Adefeso, Oladapo, DO       levothyroxine (SYNTHROID) tablet 75 mcg  75 mcg Oral Q0600 Adefeso, Oladapo, DO   75 mcg at 02/15/23 0546   prochlorperazine (COMPAZINE) injection 10 mg  10 mg Intravenous Q6H PRN Adefeso, Oladapo, DO       sodium bicarbonate 150 mEq in dextrose 5 % 1,150 mL infusion   Intravenous Continuous Adefeso, Oladapo, DO 100 mL/hr at 02/14/23 2350 New Bag at 02/14/23 2350   sodium zirconium cyclosilicate (LOKELMA) packet 10 g  10 g Oral TID Delano Metz, MD   10 g at 02/15/23 0929   tamsulosin (FLOMAX) capsule 0.8 mg  0.8 mg Oral QPM Adefeso, Oladapo, DO         Discharge Medications: Please see discharge summary for a list of discharge medications.  Relevant Imaging  Results:  Relevant Lab Results:   Additional Information    Elliot Gault, LCSW

## 2023-02-15 NOTE — TOC Initial Note (Signed)
Transition of Care [facility]) - Initial/Assessment Note    Patient Details  Name: Shane Chambers MRN: [id number] Date of Birth: 07-05-1961  Transition of Care [facility]) CM/SW Contact:    Elliot Gault, LCSW Phone Number: 02/15/2023, 11:16 AM  Clinical Narrative:                  Pt admitted from [facility]. Pt known to TOC from previous admissions.  Plan is for return to LTC at dc.   TOC will follow and assist.  Expected Discharge Plan: Long Term Nursing Home Barriers to Discharge: Continued Medical Work up   Patient Goals and CMS Choice Patient states their goals for this hospitalization and ongoing recovery are:: return to LTC          Expected Discharge Plan and Services In-house Referral: Clinical Social Work   Post Acute Care Choice: Skilled Nursing Facility Living arrangements for the past 2 months: Skilled Nursing Facility                                      Prior Living Arrangements/Services Living arrangements for the past 2 months: Skilled Nursing Facility Lives with:: Facility Resident Patient language and need for interpreter reviewed:: Yes Do you feel safe going back to the place where you live?: Yes      Need for Family Participation in Patient Care: No (Comment) Care giver support system in place?: Yes (comment)   Criminal Activity/Legal Involvement Pertinent to Current Situation/Hospitalization: No - Comment as needed  Activities of Daily Living Home Assistive Devices/Equipment: Wheelchair, Environmental consultant (specify type), Bedside commode/3-in-1, CBG Meter ADL Screening (condition at time of admission) Patient's cognitive ability adequate to safely complete daily activities?: Yes Is the patient deaf or have difficulty hearing?: No Does the patient have difficulty seeing, even when wearing glasses/contacts?: No Does the patient have difficulty concentrating, remembering, or making decisions?: No Patient able to express need for assistance with  ADLs?: Yes Does the patient have difficulty dressing or bathing?: Yes Independently performs ADLs?: No Communication: Independent Dressing (OT): Needs assistance Is this a change from baseline?: Pre-admission baseline Grooming: Needs assistance Is this a change from baseline?: Pre-admission baseline Feeding: Independent Bathing: Needs assistance Is this a change from baseline?: Pre-admission baseline Toileting: Needs assistance Is this a change from baseline?: Pre-admission baseline In/Out Bed: Needs assistance Is this a change from baseline?: Pre-admission baseline Walks in Home: Needs assistance Is this a change from baseline?: Pre-admission baseline Does the patient have difficulty walking or climbing stairs?: Yes Weakness of Legs: Both Weakness of Arms/Hands: Both  Permission Sought/Granted                  Emotional Assessment       Orientation: : Oriented to Self, Oriented to Place, Oriented to  Time, Oriented to Situation Alcohol / Substance Use: Not Applicable Psych Involvement: No (comment)  Admission diagnosis:  Hyperkalemia [E87.5] Metabolic acidosis [E87.20] High anion gap metabolic acidosis [E87.29] Congestive heart failure, unspecified HF chronicity, unspecified heart failure type [I50.9] Patient Active Problem List   Diagnosis Date Noted   Hyperkalemia 02/15/2023   Pulmonary edema 02/15/2023   Bilateral pleural effusion 02/15/2023   Nausea & vomiting 02/15/2023   Chronic diarrhea 02/15/2023   Obesity (BMI 30-39.9) 02/15/2023   High anion gap metabolic acidosis 02/14/2023   S/P BKA (below knee amputation) unilateral, right 12/17/2022   MRSA infection 11/19/2022  Amputation stump infection 11/19/2022   QT prolongation 11/10/2022   Severe recurrent major depression without psychotic features 11/05/2022   Major depressive disorder with psychotic features 11/02/2022   Acute hypoactive delirium due to another medical condition 10/13/2022   Septic  arthritis of right ankle 10/04/2022   Acute osteomyelitis 10/02/2022   Subacute osteomyelitis, right ankle and foot 10/02/2022   Acquired hypothyroidism 10/02/2022   Hypoalbuminemia due to protein-calorie malnutrition 10/02/2022   Cellulitis of right lower extremity 09/25/2022   Lactic acidosis 04/23/2022   Anemia of chronic disease 04/23/2022   Obesity, Class III, BMI 40-49.9 (morbid obesity) 04/23/2022   AKI (acute kidney injury) 04/22/2022   Elevated troponin 04/22/2022   Cancer of sigmoid colon 12/16/2021   Constipation 12/16/2021   GERD without esophagitis 12/16/2021   Iron deficiency anemia 09/17/2021   Rectal cancer 09/03/2021   Bilateral lower leg cellulitis 11/22/2020   Systolic and diastolic CHF, chronic 11/22/2020   CHF exacerbation 08/01/2020   Visit for wound check 05/28/2020   OSA (obstructive sleep apnea) 05/24/2020   COPD (chronic obstructive pulmonary disease) 04/29/2020   Leukocytosis 04/29/2020   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   Ischemic cardiomyopathy    Coronary artery disease    Acute on chronic combined systolic and diastolic CHF (congestive heart failure) 03/05/2020   Acute hyponatremia 03/05/2020   Severe Vitamin D deficiency 12/02/2019   Hypokalemia 12/01/2019   Mixed hyperlipidemia 12/01/2019   BPH (benign prostatic hyperplasia) 12/01/2019   Normocytic anemia 12/01/2019   Pneumonia due to COVID-19 virus 12/01/2019   Hypoxia    SOB (shortness of breath) 11/16/2019   Severe sepsis 11/16/2019   Essential hypertension    Uncontrolled type 2 diabetes mellitus with hypoglycemia, with long-term current use of insulin    Depression    Acute bronchitis with bronchospasm 11/15/2019   PCP:  [name], MD Pharmacy:   [facility] [address] [place], [place] - [address] [place] [address] Phone: [phone] Fax: [phone]     Social Determinants of Health (SDOH) Social History: SDOH Screenings   Food Insecurity: No Food Insecurity  (02/15/2023)  Housing: Low Risk  (02/15/2023)  Transportation Needs: No Transportation Needs (02/15/2023)  Utilities: Not At Risk (02/15/2023)  Alcohol Screen: Low Risk  (11/05/2022)  Tobacco Use: Medium Risk (02/14/2023)   SDOH Interventions:     Readmission Risk Interventions    02/15/2023   11:15 AM 11/11/2022    2:05 PM 10/03/2022   10:08 AM  Readmission Risk Prevention Plan  Transportation Screening Complete  Complete  Medication Review (RN Care Manager) Complete    HRI or Home Care Consult Complete  Complete  SW Recovery Care/Counseling Consult Complete  Complete  Palliative Care Screening Not Applicable  Not Applicable  Skilled Nursing Facility Complete  Complete     Information is confidential and restricted. Go to Review Flowsheets to unlock data.

## 2023-02-15 NOTE — Progress Notes (Signed)
PROGRESS NOTE  Shane Chambers, is a 62 y.o. male, DOB - 1961-08-15, [name]:[id number]  Admit date - 02/14/2023   Admitting Physician Frankey Shown, DO  Outpatient Primary MD for the patient is [name], MD  LOS - 1  Chief Complaint  Patient presents with   Nausea   Diarrhea      Brief Narrative:  -62 y.o. male with medical history significant of hypertension, hyperlipidemia, T2DM, GERD, COPD, CAD, hypothyroidism  admitted on 02/14/23 with AKI on CKD 3A with HyperKalemia and anion-gap metabolic acidosis    -Assessment and Plan: 1)Aki on CKD 3A-with hyperkalemia and anion gap metabolic acidosis -Suspect dyspnea secondary to contrast induced ATN (patient received contrast on 02/10/2023)-compounded by bladder outlet obstruction/chronic obstructive uropathy -Renal ultrasound without hydronephrosis or definite acute obstruction, but there is evidence of chronic obstructive uropathy as demonstrated by thickened bladder wall -Potassium was 6.9 on admission, Now trended down with Lokelma -Creatinine on 12/18/2022 was 1.66..,  Creatinine was 1.3 on 11/28/2022 -Creatinine on admission was 6.38 on 02/14/2023 -Creatinine trending down with bicarb drip -Bicarb was 11 on 02/2023----improving with bicarb drip -Anion gap remains greater than 20 -Patient is anuric-----No urine output since 3 PM on 02/13/2022--bladder scan with 250 mL, Foley placed on 02/15/2023 -Stop torsemide -Stop Metformin -Continue bicarb drip --renally adjust medications, avoid nephrotoxic agents / dehydration  / hypotension  2)HypoNatremia--- sodium on admission was 131 -Continue IV fluids  3)Hyperphosphatemia and 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  -Nephrology input appreciated  4)CAD--status post CABG in 03/2020---no chest pains or ACS type concerns Recent echo with preserved EF -EKG with sinus rhythm with nonspecific ST segment changes -Continue atorvastatin and metoprolol -Aspirin 81 mg daily  5)Presumed Rt Sided  community-acquired pneumonia---   imaging studies noted -PCT 0.22 -WBC 11.0 -Rocephin and doxycycline as ordered  6)Mild Proctocolitis----h/o rectal Ca -WBC and PCT as above #5 -Rocephin and Flagyl as ordered  7)PAD s/p rt BKA-- -continue atorvastatin -Aspirin 81 mg daily  8)Class 2 Obesity/OSA----CPAP nightly advised  9)HFpEF--- chronic diastolic dysfunction - Prior h/o HFpEF (20-25% which normalized post CABG),  -CHF echocardiogram done on 10/13/2022 showed LVEF of 55 to 60%.  No RWMA  -Patient requires IV fluids due to AKI on CKD- -monitor closely as patient is at risk for volume overload/CHF exacerbation  10) chronic diarrhea--Imodium as needed  11) BPH with LUTs--renal ultrasound shows evidence of chronic obstructive uropathy as demonstrated by thickened bladder wall -Foley placed on 02/15/2023 -Flomax as ordered  12)DM2- A1C 6.1-reflecting excellent diabetic control PTA -Hold metformin due to kidney concerns -Use Novolog/Humalog Sliding scale insulin with Accu-Cheks/Fingersticks as ordered   13) hypothyroidism--stable,  continue levothyroxine 75 mcg daily  14)GERD--hold PPI, use Pepcid as ordered  15)COPD-no acute exacerbation at this time, continue bronchodilators, avoid steroids at this time  16)HTN--restart metoprolol -Hold amlodipine  17) chronic anemia in the setting of CKD -Continue iron supplementation  Status is: Inpatient   Disposition: The patient is from: SNF [place]              Anticipated d/c is to: SNF              Anticipated d/c date is: 3 days              Patient currently is not medically stable to d/c. Barriers: Not Clinically Stable-   Code Status :  -  Code Status: Full Code   Family Communication:    NA (patient is alert, awake and coherent)   DVT  Prophylaxis  :   - SCDs   heparin injection 5,000 Units Start: 02/15/23 0600 SCDs Start: 02/15/23 0154   Lab Results  Component Value Date   PLT 319 02/14/2023   Inpatient  Medications  Scheduled Meds:  atorvastatin  40 mg Oral QHS   Chlorhexidine Gluconate Cloth  6 each Topical Daily   feeding supplement (GLUCERNA SHAKE)  237 mL Oral TID BM   heparin  5,000 Units Subcutaneous Q8H   insulin aspart  0-6 Units Subcutaneous TID WC   levothyroxine  75 mcg Oral Q0600   lidocaine  1 Application Urethral Once   sodium zirconium cyclosilicate  10 g Oral TID   tamsulosin  0.8 mg Oral QPM   Continuous Infusions:  sodium bicarbonate 150 mEq in dextrose 5 % 1,150 mL infusion 75 mL/hr at 02/15/23 1232   PRN Meds:.acetaminophen **OR** acetaminophen, ipratropium-albuterol, prochlorperazine   Anti-infectives (From admission, onward)    None      Subjective: Steele SizerWilliam Chambers today has no fevers, no emesis,  No chest pain,   - Fatigue and generalized weakness persist -No urine output since 3 PM on 02/13/2022 -Loose stools persist   Objective: Vitals:   02/15/23 0724 02/15/23 0800 02/15/23 1124 02/15/23 1200  BP:  (!) 138/55  (!) 152/57  Pulse: 78 77 83 93  Resp: 19 20 (!) 21 (!) 22  Temp: 99 F (37.2 C)  98.5 F (36.9 C)   TempSrc: Axillary  Oral   SpO2: 96% 98% 97% 97%  Weight:      Height:        Intake/Output Summary (Last 24 hours) at 02/15/2023 1301 Last data filed at 02/15/2023 0600 Gross per 24 hour  Intake 3658 ml  Output 0 ml  Net 3658 ml   Filed Weights   02/14/23 1527 02/15/23 0200  Weight: 106.1 kg 107.5 kg    Physical Exam  Gen:- Awake Alert,  in no apparent distress  HEENT:- [place].AT, No sclera icterus Neck-Supple Neck,No JVD,.  Lungs-somewhat diminished breath sounds without wheezing CV- S1, S2 normal, regular , increased truncal adiposity Abd-  +ve B.Sounds, Abd Soft, No tenderness,    Extremity/Skin:  pedal pulses present on Lt, Rt BKA Psych-affect is appropriate, oriented x3 Neuro-generalized weakness, no new focal deficits, no tremors   Data Reviewed: I have personally reviewed following labs and imaging  studies  CBC: Recent Labs  Lab 02/14/23 1640  WBC 11.0*  NEUTROABS 9.2*  HGB 8.6*  HCT 27.1*  MCV 90.6  PLT 319   Basic Metabolic Panel: Recent Labs  Lab 02/14/23 1755 02/14/23 2207 02/15/23 0329  NA 131* 132* 134*  K 6.9* 6.3* 5.9*  CL 96* 98 99  CO2 15* 11* 12*  GLUCOSE 69* 107* 150*  BUN 78* 80* 77*  CREATININE 6.03* 6.38* 6.24*  CALCIUM 9.0 8.9 8.7*  MG  --   --  1.7  PHOS  --   --  7.3*   GFR: Estimated Creatinine Clearance: 15.2 mL/min (A) (by C-G formula based on SCr of 6.24 mg/dL (H)). Liver Function Tests: Recent Labs  Lab 02/14/23 1755 02/15/23 0329  AST 19 21  ALT 11 11  ALKPHOS 126 108  BILITOT 0.7 0.7  PROT 7.6 6.9  ALBUMIN 3.3* 3.1*   Radiology Studies: US RENAL  Result Date: 02/15/2023 CLINICAL DATA:  [id number] AKI (acute kidney injury) [id number] EXAM: RENAL / URINARY TRACT ULTRASOUND COMPLETE COMPARISON:  October 05, 2022, February 14, 2023 FINDINGS: Right Kidney: Renal measurements: 14.5 x 7.6  x 7.7 cm = volume: 442 mL. Echogenicity within normal limits. No mass or hydronephrosis visualized. Left Kidney: Renal measurements: 12.8 x 6 x 7 x 7.0 cm = volume: 315 mL. Echogenicity within normal limits. No mass or hydronephrosis visualized. Bladder: Mildly prominent bladder walls for degree of distension. Other: Trace perinephric fluid adjacent to the inferior pole the LEFT kidney. IMPRESSION: 1. No hydronephrosis. 2. Trace perinephric fluid adjacent to the inferior pole the LEFT kidney, as seen on recent CT. 3. Mildly prominent bladder walls for degree of distension, likely reflecting sequela of chronic outlet obstruction. Electronically Signed   By: Meda Klinefelter M.D.   On: 02/15/2023 11:00   DG CHEST PORT 1 VIEW  Result Date: 02/15/2023 CLINICAL DATA:  54-year-old male with history of acute kidney injury. EXAM: PORTABLE CHEST - 1 VIEW COMPARISON:  None Available. FINDINGS: Unchanged cardiomegaly. Low lung volumes. Bilateral pulmonary vascular crowding. Hazy  bibasilar pulmonary opacities. No evidence of pleural effusion or pneumothorax. No acute osseous abnormality. Similar appearance of multiple fractured median sternotomy wires. IMPRESSION: Cardiomegaly with low lung volumes and pulmonary vascular crowding, no definite evidence of overt pulmonary edema. Electronically Signed   By: Marliss Coots M.D.   On: 02/15/2023 08:10   CT ABDOMEN PELVIS WO CONTRAST  Result Date: 02/14/2023 CLINICAL DATA:  Nausea, vomiting, diarrhea that increased yesterday. EXAM: CT ABDOMEN AND PELVIS WITHOUT CONTRAST TECHNIQUE: Multidetector CT imaging of the abdomen and pelvis was performed following the standard protocol without IV contrast. RADIATION DOSE REDUCTION: This exam was performed according to the departmental dose-optimization program which includes automated exposure control, adjustment of the mA and/or kV according to patient size and/or use of iterative reconstruction technique. COMPARISON:  02/10/2023 FINDINGS: Lower chest: Interlobular septal thickening in the lower lungs. Scattered centrilobular micro nodules. More dense consolidation in the right middle lobe. Small right and trace left pleural effusions. Coronary artery calcification. Hepatobiliary: Unremarkable noncontrast appearance of the liver. Cholelithiasis. No evidence of cholecystitis. Pancreas: Unremarkable. Spleen: Unremarkable. Adrenals/Urinary Tract: Normal adrenal glands. Increased perinephric stranding bilaterally compared with 02/10/2023. No urinary calculi or hydronephrosis. Unremarkable bladder. Stomach/Bowel: Normal caliber large and small bowel. Borderline wall thickening of the sigmoid colon and rectum. Stomach is within normal limits. Vascular/Lymphatic: Aortic atherosclerosis. No enlarged abdominal or pelvic lymph nodes. Reproductive: Unremarkable. Other: Mild mesenteric edema and trace free fluid in the pelvis. No free intraperitoneal air. Musculoskeletal: Body wall edema.  No acute osseous  abnormality. IMPRESSION: 1. Possible mild proctocolitis. 2. Increased nonspecific perinephric stranding compared to 02/10/2023. This may be related to fluid status/heart failure given pulmonary, mesenteric and body wall edema. 3. Interstitial pulmonary edema with small right and trace left pleural effusions. 4. Consolidation in the right middle lobe may reflect atelectasis or pneumonia. Aortic Atherosclerosis (ICD10-I70.0). Electronically Signed   By: Minerva Fester M.D.   On: 02/14/2023 20:51    Scheduled Meds:  atorvastatin  40 mg Oral QHS   Chlorhexidine Gluconate Cloth  6 each Topical Daily   feeding supplement (GLUCERNA SHAKE)  237 mL Oral TID BM   heparin  5,000 Units Subcutaneous Q8H   insulin aspart  0-6 Units Subcutaneous TID WC   levothyroxine  75 mcg Oral Q0600   lidocaine  1 Application Urethral Once   sodium zirconium cyclosilicate  10 g Oral TID   tamsulosin  0.8 mg Oral QPM   Continuous Infusions:  sodium bicarbonate 150 mEq in dextrose 5 % 1,150 mL infusion 75 mL/hr at 02/15/23 1232    LOS: 1 day  Shon Hale M.D on 02/15/2023 at 1:01 PM  Go to [email or web address] - for contact info  [facility] - Office  [phone]  If 7PM-7AM, please contact night-coverage [email or web address] 02/15/2023, 1:01 PM

## 2023-02-15 NOTE — Progress Notes (Signed)
Pt has yet to urinate since admit. Bladder scan shows 251cc. Pt refusing straight cath at this time. No complaints of pain. Dr. Thomes Dinning notified.

## 2023-02-15 NOTE — Consult Note (Signed)
Reason for Consult:Renal failure Referring Physician:  Dr. Thomes Dinning  Chief Complaint: Nausea and diarrhea  Assessment/Plan: Renal failure with hyperkalemia - ultrasound shows a thickened bladder wall which is consistent with chronic obstructive uropathy even though at the current time there is no hydronephrosis. Patient likely in ATN secondary to contrast nephropathy; patient received 80mL of Ominipaque on 02/10/2023  performed for surveillance given h/o rectal carcinoma. . Patient is already positive 3.7L over the past 24hrs.  - Lokelma 10gm BID; has only received once. - D5W + 3amps HCO3 -> decr to 26ml/hr until we have more accurate I&O's after the foley; speaking in full sentences and not in resp distress, RR normal rate. - Need to get a foley for more strict I&O's as well as high residual on bladder scan which shows but may be more. Patient has not voided since presentation; after educating the patient is willing to have the foley placed. 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 Hopefully he will start recovering renal function before needing dialysis. He is willing to have iHD if that became necessary and he understands that may still be possible in the next 24-48hrs.    -Maintain MAP>65 for optimal renal perfusion.  - Avoid nephrotoxic medications including NSAIDs and further iodinated intravenous contrast exposure. - Preferred narcotic agents for pain control are hydromorphone, fentanyl, and methadone. Morphine should not be used.  - Avoid Baclofen and avoid oral sodium phosphate and magnesium citrate based laxatives / bowel preps.  - Continue strict Input and Output monitoring. Will monitor the patient closely with you and intervene or adjust therapy as indicated by changes in clinical status/labs  Hyponatremia secondary to renal failure + long standing h/o diarrhea. Getting isotonic fluid  resuscitation. 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  HFpEF (20-25% which normalized to 60-65% post CABG in 2021) CAP with findings suggestive of such on CT DM - per primary BPH on Flomax refusing a foley despite elevated volume on bladder scan. Restart the Flomax and will need a foley catheter.   HPI: Shane Chambers is an 62 y.o. 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 Of note he has had chronic diarrhea for months as well as vomiting on the day of presentation. He denies fever, chills, chest pain, joint pain or NSAID use. In the ED vitals were stable but patient had a bicarb of 15 and a BN/Cr of 78/6.03. CT [facility] showed body wall edema, interstitial pulmonary edema with a consolidation in the RML. Patient also has an occasional cough associated with mild SOB. Patient was given protocol with insulin, D50, CaGluconate, NS. Patient was on Metformin, Klorcon daily, Flomax, Demadex. Patient's baseline creatinine was in the 1.00-1.42 range from 10/10/2022-end of Feb but then 1.7-2 early Feb of 2024.  Patient also had acute renal failure in the end of Nov 2023 with Cr peaking at 3.12.  Of note patient received 80mL of Ominipaque on 02/10/2023  performed for surveillance given h/o rectal carcinoma.  ROS Pertinent items are noted in HPI.  Chemistry and CBC: Creatinine, Ser  Date/Time Value Ref Range Status  02/15/2023  03:29 AM 6.24 (H) 0.61 - 1.24 mg/dL Final  11/12/7251 66:44 PM 6.38 (H) 0.61 - 1.24 mg/dL Final  03/47/4259 56:38 PM 6.03 (H) 0.61 - 1.24 mg/dL Final  75/64/3329 51:88 AM 2.04 (H) 0.61 - 1.24 mg/dL Final  41/66/0630 16:01 AM 1.66 (H) 0.61 - 1.24 mg/dL Final  09/32/3557 32:20 PM 1.72 (H) 0.61 - 1.24 mg/dL  Final  25/42/7062 37:62 AM 1.31 (H) 0.61 - 1.24 mg/dL Final  83/15/1761 60:73 AM 1.29 (H) 0.61 - 1.24 mg/dL Final  71/04/2693 85:46 AM 1.08 0.61 - 1.24 mg/dL Final  27/01/5008 38:18 AM 1.23 0.61 - 1.24 mg/dL Final  29/93/7169 67:89 AM 1.25 (H) 0.61 - 1.24 mg/dL Final  38/08/1750 02:58 AM 1.24 0.61 - 1.24 mg/dL Final  52/77/8242 35:36 AM 1.32 (H) 0.61 - 1.24 mg/dL Final  14/43/1540 08:67 AM 1.42 (H) 0.61 - 1.24 mg/dL Final  61/95/0932 67:12 AM 1.22 0.61 - 1.24 mg/dL Final  45/80/9983 38:25 AM 1.07 0.61 - 1.24 mg/dL Final  05/39/7673 41:93 AM 1.11 0.61 - 1.24 mg/dL Final  79/12/4095 35:32 AM 1.25 (H) 0.61 - 1.24 mg/dL Final  99/24/2683 41:96 AM 1.18 0.61 - 1.24 mg/dL Final  22/29/7989 21:19 AM 1.26 (H) 0.61 - 1.24 mg/dL Final  41/74/0814 48:18 AM 1.30 (H) 0.61 - 1.24 mg/dL Final  56/31/4970 26:37 AM 1.34 (H) 0.61 - 1.24 mg/dL Final  85/88/5027 74:12 AM 1.15 0.61 - 1.24 mg/dL Final  87/86/7672 09:47 AM 1.03 0.61 - 1.24 mg/dL Final  09/62/8366 29:47 PM 1.21 0.61 - 1.24 mg/dL Final  65/46/5035 46:56 PM 1.16 0.61 - 1.24 mg/dL Final  81/27/5170 01:74 AM 1.17 0.61 - 1.24 mg/dL Final  94/49/6759 16:38 AM 1.18 0.61 - 1.24 mg/dL Final  46/65/9935 70:17 AM 1.12 0.61 - 1.24 mg/dL Final  79/39/0300 92:33 AM 1.15 0.61 - 1.24 mg/dL Final  00/76/2263 33:54 AM 1.31 (H) 0.61 - 1.24 mg/dL Final  56/25/6389 37:34 AM 1.26 (H) 0.61 - 1.24 mg/dL Final  28/76/8115 72:62 PM 1.27 (H) 0.61 - 1.24 mg/dL Final  03/55/9741 63:84 AM 1.48 (H) 0.61 - 1.24 mg/dL Final  53/64/6803 21:22 PM 1.35 (H) 0.61 - 1.24 mg/dL Final  48/25/0037 04:88 AM 1.37 (H) 0.61 - 1.24 mg/dL Final  89/16/9450 38:88 AM 1.56 (H) 0.61 - 1.24 mg/dL Final  28/00/3491 79:15 AM 2.03 (H) 0.61 - 1.24 mg/dL Final  05/69/7948 01:65 AM 2.62 (H) 0.61 - 1.24 mg/dL Final  53/74/8270 78:67 AM 3.06 (H) 0.61 - 1.24 mg/dL Final  54/49/2010 07:12 AM 3.12 (H) 0.61 - 1.24 mg/dL Final  19/75/8832 54:98 AM 2.33 (H) 0.61 - 1.24 mg/dL Final  26/41/5830 94:07 AM  1.82 (H) 0.61 - 1.24 mg/dL Final  68/06/8109 31:59 PM 1.89 (H) 0.61 - 1.24 mg/dL Final  45/85/9292 44:62 AM 1.44 (H) 0.61 - 1.24 mg/dL Final  86/38/1771 16:57 AM 1.49 (H) 0.61 - 1.24 mg/dL Final  90/38/3338 32:91 AM 1.59 (H) 0.61 - 1.24 mg/dL Final  91/66/0600 45:99 AM 1.48 (H) 0.61 - 1.24 mg/dL Final  77/41/4239 53:20 AM 1.71 (H) 0.61 - 1.24 mg/dL Final  23/34/3568 61:68 AM 2.17 (H) 0.61 - 1.24 mg/dL Final  37/29/0211 15:52 PM 2.19 (H) 0.61 - 1.24 mg/dL Final  06/12/2335 12:24 PM 1.86 (H) 0.61 - 1.24 mg/dL Final   Recent Labs  Lab 02/14/23 1755 02/14/23 2207 02/15/23 0329  NA 131* 132* 134*  K 6.9* 6.3* 5.9*  CL 96* 98 99  CO2 15* 11* 12*  GLUCOSE 69* 107* 150*  BUN 78* 80* 77*  CREATININE 6.03* 6.38* 6.24*  CALCIUM 9.0 8.9 8.7*  PHOS  --   --  7.3*   Recent Labs  Lab 02/14/23 1640  WBC 11.0*  NEUTROABS 9.2*  HGB 8.6*  HCT 27.1*  MCV 90.6  PLT 319   Liver Function Tests: Recent Labs  Lab 02/14/23 1755 02/15/23 0329  AST 19 21  ALT 11 11  ALKPHOS 126 108  BILITOT 0.7 0.7  PROT 7.6 6.9  ALBUMIN 3.3* 3.1*   Recent Labs  Lab 02/14/23 1755  LIPASE 37   No results for input(s): "AMMONIA" in the last 168 hours. Cardiac Enzymes: No results for input(s): "CKTOTAL", "CKMB", "CKMBINDEX", "TROPONINI" in the last 168 hours. Iron Studies: No results for input(s): "IRON", "TIBC", "TRANSFERRIN", "FERRITIN" in the last 72 hours. PT/INR: (inr:5)  Xrays/Other Studies: ) Results for orders placed or performed during the hospital encounter of 02/14/23 (from the past 48 hour(s))  CBC with Differential     Status: Abnormal   Collection Time: 02/14/23  4:40 PM  Result Value Ref Range   WBC 11.0 (H) 4.0 - 10.5 K/uL   RBC 2.99 (L) 4.22 - 5.81 MIL/uL   Hemoglobin 8.6 (L) 13.0 - 17.0 g/dL   HCT 16.1 (L) 09.6 - 04.5 %   MCV 90.6 80.0 - 100.0 fL   MCH 28.8 26.0 - 34.0 pg   MCHC 31.7 30.0 - 36.0 g/dL   RDW 40.9 81.1 - 91.4 %   Platelets 319 150 - 400 K/uL   nRBC  0.0 0.0 - 0.2 %   Neutrophils Relative % 84 %   Neutro Abs 9.2 (H) 1.7 - 7.7 K/uL   Lymphocytes Relative 8 %   Lymphs Abs 0.9 0.7 - 4.0 K/uL   Monocytes Relative 6 %   Monocytes Absolute 0.7 0.1 - 1.0 K/uL   Eosinophils Relative 1 %   Eosinophils Absolute 0.1 0.0 - 0.5 K/uL   Basophils Relative 0 %   Basophils Absolute 0.0 0.0 - 0.1 K/uL   Immature Granulocytes 1 %   Abs Immature Granulocytes 0.05 0.00 - 0.07 K/uL    Comment: Performed at [facility], [address] [place], [place] [address]  Comprehensive metabolic panel     Status: Abnormal   Collection Time: 02/14/23  5:55 PM  Result Value Ref Range   Sodium 131 (L) 135 - 145 mmol/L   Potassium 6.9 (HH) 3.5 - 5.1 mmol/L    Comment: CRITICAL RESULT CALLED TO, READ BACK BY AND VERIFIED WITH FLETCHER, A AT 1823 ON 02/14/23 BY SMN.   Chloride 96 (L) 98 - 111 mmol/L   CO2 15 (L) 22 - 32 mmol/L   Glucose, Bld 69 (L) 70 - 99 mg/dL    Comment: Glucose reference range applies only to samples taken after fasting for at least 8 hours.   BUN 78 (H) 6 - 20 mg/dL   Creatinine, Ser 6.21 (H) 0.61 - 1.24 mg/dL   Calcium 9.0 8.9 - 30.8 mg/dL   Total Protein 7.6 6.5 - 8.1 g/dL   Albumin 3.3 (L) 3.5 - 5.0 g/dL   AST 19 15 - 41 U/L   ALT 11 0 - 44 U/L   Alkaline Phosphatase 126 38 - 126 U/L   Total Bilirubin 0.7 0.3 - 1.2 mg/dL   GFR, Estimated 10 (L) >60 mL/min    Comment: (NOTE) Calculated using the CKD-EPI Creatinine Equation (2021)    Anion gap 20 (H) 5 - 15    Comment: Performed at [facility], [address], [place], [place] [address]  Lipase, blood     Status: None   Collection Time: 02/14/23  5:55 PM  Result Value Ref Range   Lipase 37 11 - 51 U/L    Comment: Performed at [facility], [address], [place], [place] [address]  CBG monitoring, ED     Status: Abnormal   Collection Time: 02/14/23  7:22 PM  Result Value Ref Range   Glucose-Capillary 68 (L) 70 - 99 mg/dL    Comment: Glucose reference range applies only to samples  taken after fasting for at least 8 hours.  CBG monitoring, ED     Status: Abnormal   Collection Time: 02/14/23  8:17 PM  Result Value Ref Range   Glucose-Capillary 105 (H) 70 - 99 mg/dL    Comment: Glucose reference range applies only to samples taken after fasting for at least 8 hours.  Basic metabolic panel     Status: Abnormal   Collection Time: 02/14/23 10:07 PM  Result Value Ref Range   Sodium 132 (L) 135 - 145 mmol/L   Potassium 6.3 (HH) 3.5 - 5.1 mmol/L    Comment: CRITICAL RESULT CALLED TO, READ BACK BY AND VERIFIED WITH WALKER, T AT 2313 ON 02/14/23 BY SMN.   Chloride 98 98 - 111 mmol/L   CO2 11 (L) 22 - 32 mmol/L   Glucose, Bld 107 (H) 70 - 99 mg/dL    Comment: Glucose reference range applies only to samples taken after fasting for at least 8 hours.   BUN 80 (H) 6 - 20 mg/dL   Creatinine, Ser 7.82 (H) 0.61 - 1.24 mg/dL   Calcium 8.9 8.9 - 95.6 mg/dL   GFR, Estimated 9 (L) >60 mL/min    Comment: (NOTE) Calculated using the CKD-EPI Creatinine Equation (2021)    Anion gap 23 (H) 5 - 15    Comment: ELECTROLYTES REPEATED TO VERIFY Performed at [facility], [address], [place], [place] [address]   Troponin I (High Sensitivity)     Status: None   Collection Time: 02/14/23 10:07 PM  Result Value Ref Range   Troponin I (High Sensitivity) 11 <18 ng/L    Comment: (NOTE) Elevated high sensitivity troponin I (hsTnI) values and significant  changes across serial measurements may suggest ACS but many other  chronic and acute conditions are known to elevate hsTnI results.  Refer to the "Links" section for chest pain algorithms and additional  guidance. Performed at [facility], [address] [place], [place] [address]   Comprehensive metabolic panel     Status: Abnormal   Collection Time: 02/15/23  3:29 AM  Result Value Ref Range   Sodium 134 (L) 135 - 145 mmol/L   Potassium 5.9 (H) 3.5 - 5.1 mmol/L   Chloride 99 98 - 111 mmol/L   CO2 12 (L) 22 - 32 mmol/L   Glucose, Bld  150 (H) 70 - 99 mg/dL    Comment: Glucose reference range applies only to samples taken after fasting for at least 8 hours.   BUN 77 (H) 6 - 20 mg/dL   Creatinine, Ser 6.96 (H) 0.61 - 1.24 mg/dL   Calcium 8.7 (L) 8.9 - 10.3 mg/dL   Total Protein 6.9 6.5 - 8.1 g/dL   Albumin 3.1 (L) 3.5 - 5.0 g/dL   AST 21 15 - 41 U/L   ALT 11 0 - 44 U/L   Alkaline Phosphatase 108 38 - 126 U/L   Total Bilirubin 0.7 0.3 -  1.2 mg/dL   GFR, Estimated 10 (L) >60 mL/min    Comment: (NOTE) Calculated using the CKD-EPI Creatinine Equation (2021)    Anion gap 23 (H) 5 - 15    Comment: ELECTROLYTES REPEATED TO VERIFY Performed at [facility], [address] [place], [place] [address]   Magnesium     Status: None   Collection Time: 02/15/23  3:29 AM  Result Value Ref Range   Magnesium 1.7 1.7 - 2.4 mg/dL    Comment: Performed at [facility], [address] [place], [place] [address]  Phosphorus     Status: Abnormal   Collection Time: 02/15/23  3:29 AM  Result Value Ref Range   Phosphorus 7.3 (H) 2.5 - 4.6 mg/dL    Comment: Performed at [facility], [address] [place], [place] [address]  Procalcitonin     Status: None   Collection Time: 02/15/23  3:29 AM  Result Value Ref Range   Procalcitonin 0.22 ng/mL    Comment:        Interpretation: PCT (Procalcitonin) <= 0.5 ng/mL: Systemic infection (sepsis) is not likely. Local bacterial infection is possible. (NOTE)       Sepsis PCT Algorithm           Lower Respiratory Tract                                      Infection PCT Algorithm    ----------------------------     ----------------------------         PCT < 0.25 ng/mL                PCT < 0.10 ng/mL          Strongly encourage             Strongly discourage   discontinuation of antibiotics    initiation of antibiotics    ----------------------------     -----------------------------       PCT 0.25 - 0.50 ng/mL            PCT 0.10 - 0.25 ng/mL               OR       >80% decrease in PCT             Discourage initiation of                                            antibiotics      Encourage discontinuation           of antibiotics    ----------------------------     -----------------------------         PCT >= 0.50 ng/mL              PCT 0.26 - 0.50 ng/mL               AND        <80% decrease in PCT             Encourage initiation of                                             antibiotics  Encourage continuation           of antibiotics    ----------------------------     -----------------------------        PCT >= 0.50 ng/mL                  PCT > 0.50 ng/mL               AND         increase in PCT                  Strongly encourage                                      initiation of antibiotics    Strongly encourage escalation           of antibiotics                                     -----------------------------                                           PCT <= 0.25 ng/mL                                                 OR                                        > 80% decrease in PCT                                      Discontinue / Do not initiate                                             antibiotics  Performed at [facility], [address] [place], [place] [address]   Glucose, capillary     Status: Abnormal   Collection Time: 02/15/23  7:47 AM  Result Value Ref Range   Glucose-Capillary 110 (H) 70 - 99 mg/dL    Comment: Glucose reference range applies only to samples taken after fasting for at least 8 hours.   US RENAL  Result Date: 02/15/2023 CLINICAL DATA:  [id number] AKI (acute kidney injury) [id number] EXAM: RENAL / URINARY TRACT ULTRASOUND COMPLETE COMPARISON:  October 05, 2022, February 14, 2023 FINDINGS: Right Kidney: Renal measurements: 14.5 x 7.6 x 7.7 cm = volume: 442 mL. Echogenicity within normal limits. No mass or hydronephrosis visualized. Left Kidney: Renal measurements: 12.8 x 6 x 7 x 7.0 cm = volume: 315 mL. Echogenicity within normal limits.  No mass or hydronephrosis visualized. Bladder: Mildly prominent bladder walls for degree of distension. Other: Trace perinephric fluid adjacent to the inferior pole the LEFT kidney. IMPRESSION: 1. No hydronephrosis. 2. Trace perinephric fluid adjacent to the inferior pole  the LEFT kidney, as seen on recent CT. 3. Mildly prominent bladder walls for degree of distension, likely reflecting sequela of chronic outlet obstruction. Electronically Signed   By: Meda Klinefelter M.D.   On: 02/15/2023 11:00   DG CHEST PORT 1 VIEW  Result Date: 02/15/2023 CLINICAL DATA:  86-year-old male with history of acute kidney injury. EXAM: PORTABLE CHEST - 1 VIEW COMPARISON:  None Available. FINDINGS: Unchanged cardiomegaly. Low lung volumes. Bilateral pulmonary vascular crowding. Hazy bibasilar pulmonary opacities. No evidence of pleural effusion or pneumothorax. No acute osseous abnormality. Similar appearance of multiple fractured median sternotomy wires. IMPRESSION: Cardiomegaly with low lung volumes and pulmonary vascular crowding, no definite evidence of overt pulmonary edema. Electronically Signed   By: Marliss Coots M.D.   On: 02/15/2023 08:10   CT ABDOMEN PELVIS WO CONTRAST  Result Date: 02/14/2023 CLINICAL DATA:  Nausea, vomiting, diarrhea that increased yesterday. EXAM: CT ABDOMEN AND PELVIS WITHOUT CONTRAST TECHNIQUE: Multidetector CT imaging of the abdomen and pelvis was performed following the standard protocol without IV contrast. RADIATION DOSE REDUCTION: This exam was performed according to the departmental dose-optimization program which includes automated exposure control, adjustment of the mA and/or kV according to patient size and/or use of iterative reconstruction technique. COMPARISON:  02/10/2023 FINDINGS: Lower chest: Interlobular septal thickening in the lower lungs. Scattered centrilobular micro nodules. More dense consolidation in the right middle lobe. Small right and trace left pleural effusions.  Coronary artery calcification. Hepatobiliary: Unremarkable noncontrast appearance of the liver. Cholelithiasis. No evidence of cholecystitis. Pancreas: Unremarkable. Spleen: Unremarkable. Adrenals/Urinary Tract: Normal adrenal glands. Increased perinephric stranding bilaterally compared with 02/10/2023. No urinary calculi or hydronephrosis. Unremarkable bladder. Stomach/Bowel: Normal caliber large and small bowel. Borderline wall thickening of the sigmoid colon and rectum. Stomach is within normal limits. Vascular/Lymphatic: Aortic atherosclerosis. No enlarged abdominal or pelvic lymph nodes. Reproductive: Unremarkable. Other: Mild mesenteric edema and trace free fluid in the pelvis. No free intraperitoneal air. Musculoskeletal: Body wall edema.  No acute osseous abnormality. IMPRESSION: 1. Possible mild proctocolitis. 2. Increased nonspecific perinephric stranding compared to 02/10/2023. This may be related to fluid status/heart failure given pulmonary, mesenteric and body wall edema. 3. Interstitial pulmonary edema with small right and trace left pleural effusions. 4. Consolidation in the right middle lobe may reflect atelectasis or pneumonia. Aortic Atherosclerosis (ICD10-I70.0). Electronically Signed   By: Minerva Fester M.D.   On: 02/14/2023 20:51    PMH:   Past Medical History:  Diagnosis Date   Anemia    Arthritis    CAD (coronary artery disease)    a. s/p CABG in 03/2020 with LIMA-LAD, RIMA-PL, RA-[id number]-OM1   Cancer    rectal   Cellulitis    COPD (chronic obstructive pulmonary disease)    Depression    Diabetes mellitus without complication    GERD (gastroesophageal reflux disease)    History of kidney stones    Hyperlipidemia 12/01/2019   Hypertension    Hypothyroidism    Ischemic cardiomyopathy    a. EF 20-25% by echo in 02/2020 b. at 40% by echo in 03/2020 c. EF normalized to 60-65% by echo in 04/2020   Myocardial infarction    Peripheral vascular disease    Sleep apnea    Type 2  diabetes mellitus     PSH:   Past Surgical History:  Procedure Laterality Date   AMPUTATION Right 10/05/2022   Procedure: AMPUTATION BELOW KNEE;  Surgeon: Nadara Mustard, MD;  Location: [facility] OR;  Service: Orthopedics;  Laterality: Right;  AMPUTATION Right 11/14/2022   Procedure: AMPUTATION BELOW KNEE REVISION; WASHOUT;  Surgeon: Renford Dills, MD;  Location: [facility] ORS;  Service: Vascular;  Laterality: Right;   AMPUTATION Right 11/18/2022   Procedure: AMPUTATION BELOW KNEE REVISION AND CLOSURE;  Surgeon: Renford Dills, MD;  Location: [facility] ORS;  Service: Vascular;  Laterality: Right;   APPLICATION OF WOUND VAC Right 10/05/2022   Procedure: APPLICATION OF WOUND VAC;  Surgeon: Nadara Mustard, MD;  Location: [facility] OR;  Service: Orthopedics;  Laterality: Right;   BIOPSY  07/18/2021   Procedure: BIOPSY;  Surgeon: Lanelle Bal, DO;  [facility];  Service: Endoscopy;;   BIOPSY  01/09/2022   Procedure: BIOPSY;  Surgeon: Lanelle Bal, DO;  [facility];  Service: Endoscopy;;   COLONOSCOPY WITH PROPOFOL N/A 07/18/2021   Carver: 15 millimeter polyp removed from the sigmoid colon, 5 mm polyp removed from sigmoid colon.  Nonbleeding internal hemorrhoids.  Significant looping of the colon. sigmoid path showed invasive colonic adenocarcinoma involving tubular adenoma (invades to depth of 2mm, carcinoma 1mm from margin, no lymphovascular invasion, no poorly differentiated component.   CORONARY ARTERY BYPASS GRAFT N/A 03/13/2020   Procedure: CORONARY ARTERY BYPASS GRAFTING (CABG) times five using bilateral Internal mammary arteries and left radial artery;  Surgeon: Linden Dolin, MD;  Location: [facility] OR;  Service: Open Heart Surgery;  Laterality: N/A;   DENTAL SURGERY     ESOPHAGOGASTRODUODENOSCOPY (EGD) WITH PROPOFOL N/A 07/18/2021   Carver: 1 gastric polyp status post biopsy, gastritis. gastric bx with slight chronic inflammation and no H.pyori. GEJ polypectomy with mild  inflammation only   FLEXIBLE SIGMOIDOSCOPY N/A 08/26/2021   Carver: Nonbleeding internal hemorrhoids.  15 mm ulcers from previous polypectomy found in the rectum.  No evidence of previous polyp.  Located 5 to 8 cm from anal verge.   FLEXIBLE SIGMOIDOSCOPY N/A 01/09/2022   Procedure: FLEXIBLE SIGMOIDOSCOPY;  Surgeon: Lanelle Bal, DO;  [facility];  Service: Endoscopy;  Laterality: N/A;   POLYPECTOMY  07/18/2021   Procedure: POLYPECTOMY INTESTINAL;  Surgeon: Lanelle Bal, DO;  [facility];  Service: Endoscopy;;   RADIAL ARTERY HARVEST Left 03/13/2020   Procedure: RADIAL ARTERY HARVEST,;  Surgeon: Linden Dolin, MD;  Location: [facility];  Service: Open Heart Surgery;  Laterality: Left;   RIGHT/LEFT HEART CATH AND CORONARY ANGIOGRAPHY N/A 03/07/2020   Procedure: RIGHT/LEFT HEART CATH AND CORONARY ANGIOGRAPHY;  Surgeon: Tonny Bollman, MD;  Location: [facility];  Service: Cardiovascular;  Laterality: N/A;   STUMP REVISION Right 12/17/2022   Procedure: RIGHT BELOW KNEE AMPUTATION REVISION;  Surgeon: Nadara Mustard, MD;  Location: [facility] OR;  Service: Orthopedics;  Laterality: Right;   SUBMUCOSAL LIFTING INJECTION  01/09/2022   Procedure: SUBMUCOSAL LIFTING INJECTION;  Surgeon: Lanelle Bal, DO;  [facility];  Service: Endoscopy;;   SUBMUCOSAL TATTOO INJECTION  01/09/2022   Procedure: SUBMUCOSAL TATTOO INJECTION;  Surgeon: Lanelle Bal, DO;  [facility];  Service: Endoscopy;;   TEE WITHOUT CARDIOVERSION N/A 03/13/2020   Procedure: TRANSESOPHAGEAL ECHOCARDIOGRAM (TEE);  Surgeon: Linden Dolin, MD;  Location: [facility] OR;  Service: Open Heart Surgery;  Laterality: N/A;    Allergies:  Allergies  Allergen Reactions   Ace Inhibitors Swelling and Cough    (Not on MAR at [name])   Haloperidol And Related     Do NOT give anti-psychotics due to risk of  Torsades and  QT prolongation (per Dr. Teena Irani  request)   Other Itching    [name]   Shellfish Allergy     Listed on MAR   Penicillins Itching and Rash    Has patient had a PCN reaction causing immediate rash, facial/tongue/throat swelling, SOB or lightheadedness with hypotension: Unknown Has patient had a PCN reaction causing severe rash involving mucus membranes or skin necrosis: Unknown Has patient had a PCN reaction that required hospitalization: Unknown Has patient had a PCN reaction occurring within the last 10 years: Unknown If all of the above answers are "NO", then may proceed with Cephalosporin use.  Tolerated ancef (12-17-22)     Medications:   Prior to Admission medications   Medication Sig Start Date End Date Taking? Authorizing Provider  acetaminophen (TYLENOL) 500 MG tablet Take 1,000 mg by mouth every 8 (eight) hours as needed for fever or moderate pain.    [provider]  albuterol (VENTOLIN HFA) 108 (90 Base) MCG/ACT inhaler Inhale 2 puffs into the lungs every 6 (six) hours as needed for wheezing or shortness of breath. 05/02/22   Johnson, Clanford L, MD  alum & mag hydroxide-simeth (MAALOX/MYLANTA) 200-200-20 MG/5ML suspension Take 30 mLs by mouth every 6 (six) hours as needed for indigestion or heartburn. 10/17/22   Ghimire, Werner Lean, MD  amLODipine (NORVASC) 10 MG tablet Take 1 tablet (10 mg total) by mouth daily. 10/18/22   Ghimire, Werner Lean, MD  ascorbic acid (VITAMIN C) 500 MG tablet Take 1 tablet (500 mg total) by mouth 2 (two) times daily. 11/28/22 02/26/23  Gillis Santa, MD  aspirin 81 MG EC tablet Take 1 tablet (81 mg total) by mouth daily with breakfast. 11/29/20   Shon Hale, MD  atorvastatin (LIPITOR) 40 MG tablet Take 1 tablet (40 mg total) by mouth at bedtime. 11/28/22   Gillis Santa, MD  barrier cream (NON-SPECIFIED) CREA Apply 1 Application topically in the morning, at noon, and at bedtime. Every shift apply to buttocks    [provider]  budesonide-formoterol (SYMBICORT)  160-4.5 MCG/ACT inhaler Inhale 2 puffs into the lungs in the morning and at bedtime. 05/24/20   Oretha Milch, MD  Cholecalciferol 125 MCG (5000 UT) TABS Take 1 tablet by mouth every Monday.    [provider]  clotrimazole-betamethasone (LOTRISONE) cream Apply 1 Application topically 2 (two) times daily.    [provider]  ferrous gluconate (FERGON) 324 MG tablet Take 324 mg by mouth daily.    [provider]  gabapentin (NEURONTIN) 300 MG capsule Take 2 capsules (600 mg total) by mouth 2 (two) times daily. 11/28/22   Gillis Santa, MD  Glucerna [facility]) LIQD Take 1 Can by mouth with breakfast, with lunch, and with evening meal.    [provider]  insulin aspart (NOVOLOG FLEXPEN) 100 UNIT/ML FlexPen 0-15 Units, Subcutaneous, 3 times daily with meals CBG < 70: implement hypoglycemia protocol-call MD CBG 70 - 120: 0 units CBG 121 - 150: 2 units CBG 151 - 200: 3 units CBG 201 - 250: 5 units CBG 251 - 300: 8 units CBG 301 - 350: 11 units CBG 351 - 400: 15 units CBG > 400: 10/17/22   Ghimire, Werner Lean, MD  ipratropium-albuterol (DUONEB) 0.5-2.5 (3) MG/3ML SOLN Take 3 mLs by nebulization every 6 (six) hours as needed. 10/17/22   Ghimire, Werner Lean, MD  levothyroxine (SYNTHROID) 75 MCG tablet Take 75 mcg by mouth daily before breakfast.    [provider]  lurasidone (LATUDA) 20  MG TABS tablet Take 20 mg by mouth every evening.    [provider]  magnesium oxide (MAG-OX) 400 MG tablet Take 400 mg by mouth daily.    [provider]  metFORMIN (GLUCOPHAGE) 1000 MG tablet Take 1,000 mg by mouth 2 (two) times daily.    [provider]  metoprolol tartrate (LOPRESSOR) 50 MG tablet Take 1 tablet (50 mg total) by mouth 2 (two) times daily. 11/28/22   Gillis Santa, MD  Multiple Vitamin (MULTIVITAMIN) tablet Take 1 tablet by mouth daily.    [provider]  omeprazole (PRILOSEC) 40 MG capsule Take 40 mg by mouth daily.  01/01/22   [provider]  oxyCODONE-acetaminophen (PERCOCET/ROXICET) 5-325 MG tablet Take 1 tablet by mouth every 4 (four) hours as needed. 12/19/22   Nadara Mustard, MD  polyethylene glycol (MIRALAX / GLYCOLAX) 17 g packet Take 17 g by mouth daily as needed for moderate constipation.    [provider]  polyvinyl alcohol (LIQUIFILM TEARS) 1.4 % ophthalmic solution Place 1 drop into both eyes daily.    [provider]  potassium chloride SA (KLOR-CON M) 20 MEQ tablet Take 1 tablet (20 mEq total) by mouth daily. 05/05/22   Johnson, Clanford L, MD  senna-docusate (SENOKOT-S) 8.6-50 MG tablet Take 1 tablet by mouth 2 (two) times daily. 10/17/22   Ghimire, Werner Lean, MD  tamsulosin (FLOMAX) 0.4 MG CAPS capsule Take 0.8 mg by mouth every evening.    [provider]  thiamine (VITAMIN B-1) 100 MG tablet Take 1 tablet (100 mg total) by mouth daily. 10/18/22   Ghimire, Werner Lean, MD  torsemide (DEMADEX) 20 MG tablet Take 2 tablets (40 mg total) by mouth daily. 10/17/22   Ghimire, Werner Lean, MD  venlafaxine XR (EFFEXOR-XR) 150 MG 24 hr capsule Take 1 capsule (150 mg total) by mouth daily with breakfast. 11/29/22   Gillis Santa, MD  Vitamin D, Ergocalciferol, (DRISDOL) 1.25 MG (50000 UNIT) CAPS capsule Take 1 capsule (50,000 Units total) by mouth every 7 (seven) days. 12/03/22 03/03/23  Gillis Santa, MD    Discontinued Meds:  There are no discontinued medications.  Social History:  reports that he quit smoking about 27 years ago. His smoking use included cigarettes. He smoked an average of 1.5 packs per day. He has never used smokeless tobacco. He reports that he does not currently use alcohol. He reports that he does not use drugs.  Family History:   Family History  Problem Relation Age of Onset   Hypertension Mother     Blood pressure (!) 138/55, pulse 77, temperature 99 F (37.2 C), temperature source Axillary, resp. rate 20, height 5\' 9"  (1.753 m), weight 107.5 kg,  SpO2 98 %. General appearance: alert, cooperative, and appears stated age Head: Normocephalic, without obvious abnormality, atraumatic Eyes: negative Neck: no adenopathy, no carotid bruit, supple, symmetrical, trachea midline, and thyroid not enlarged, symmetric, no tenderness/mass/nodules Back: symmetric, no curvature. ROM normal. No CVA tenderness. Resp: clear to auscultation bilaterally Cardio: regular rate and rhythm GI: soft, non-tender; bowel sounds normal; no masses,  no organomegaly Extremities: edema , right BKA, wound well healed Pulses: 2+ and symmetric Skin: Skin color, texture, turgor normal. No rashes or lesions       Sherrell Farish, Len Blalock, MD 02/15/2023, 11:13 AM

## 2023-02-16 ENCOUNTER — Other Ambulatory Visit (HOSPITAL_COMMUNITY): Payer: Self-pay | Admitting: *Deleted

## 2023-02-16 ENCOUNTER — Inpatient Hospital Stay (HOSPITAL_COMMUNITY): Payer: Medicare Other

## 2023-02-16 ENCOUNTER — Ambulatory Visit: Payer: Medicare Other | Admitting: Hematology

## 2023-02-16 ENCOUNTER — Encounter: Payer: Medicare Other | Admitting: Orthopedic Surgery

## 2023-02-16 DIAGNOSIS — E8729 Other acidosis: Secondary | ICD-10-CM | POA: Diagnosis not present

## 2023-02-16 DIAGNOSIS — I5021 Acute systolic (congestive) heart failure: Secondary | ICD-10-CM

## 2023-02-16 LAB — GLUCOSE, CAPILLARY
Glucose-Capillary: 87 mg/dL (ref 70–99)
Glucose-Capillary: 76 mg/dL (ref 70–99)
Glucose-Capillary: 120 mg/dL — ABNORMAL HIGH (ref 70–99)
Glucose-Capillary: 110 mg/dL — ABNORMAL HIGH (ref 70–99)
Glucose-Capillary: 165 mg/dL — ABNORMAL HIGH (ref 70–99)
Glucose-Capillary: 156 mg/dL — ABNORMAL HIGH (ref 70–99)

## 2023-02-16 LAB — RENAL FUNCTION PANEL
Albumin: 3.1 g/dL — ABNORMAL LOW (ref 3.5–5.0)
Anion gap: 17 — ABNORMAL HIGH (ref 5–15)
BUN: 84 mg/dL — ABNORMAL HIGH (ref 6–20)
CO2: 20 mmol/L — ABNORMAL LOW (ref 22–32)
Calcium: 8.3 mg/dL — ABNORMAL LOW (ref 8.9–10.3)
Chloride: 95 mmol/L — ABNORMAL LOW (ref 98–111)
Creatinine, Ser: 7.18 mg/dL — ABNORMAL HIGH (ref 0.61–1.24)
GFR, Estimated: 8 mL/min — ABNORMAL LOW (ref >60)
Glucose, Bld: 92 mg/dL (ref 70–99)
Phosphorus: 6.6 mg/dL — ABNORMAL HIGH (ref 2.5–4.6)
Potassium: 5.2 mmol/L — ABNORMAL HIGH (ref 3.5–5.1)
Sodium: 132 mmol/L — ABNORMAL LOW (ref 135–145)

## 2023-02-16 LAB — CBC
HCT: 25.3 % — ABNORMAL LOW (ref 39.0–52.0)
Hemoglobin: 8.2 g/dL — ABNORMAL LOW (ref 13.0–17.0)
MCH: 28.7 pg (ref 26.0–34.0)
MCHC: 32.4 g/dL (ref 30.0–36.0)
MCV: 88.5 fL (ref 80.0–100.0)
Platelets: 272 10*3/uL (ref 150–400)
RBC: 2.86 MIL/uL — ABNORMAL LOW (ref 4.22–5.81)
RDW: 12.7 % (ref 11.5–15.5)
WBC: 8.9 10*3/uL (ref 4.0–10.5)
nRBC: 0 % (ref 0.0–0.2)

## 2023-02-16 LAB — IRON AND TIBC
Iron: 16 ug/dL — ABNORMAL LOW (ref 45–182)
Saturation Ratios: 7 % — ABNORMAL LOW (ref 17.9–39.5)
TIBC: 218 ug/dL — ABNORMAL LOW (ref 250–450)
UIBC: 202 ug/dL

## 2023-02-16 LAB — ECHOCARDIOGRAM LIMITED
AR max vel: 1.09 cm2
AV Area VTI: 1.17 cm2
AV Area mean vel: 1.2 cm2
AV Mean grad: 20.7 mmHg
AV Peak grad: 35.3 mmHg
Ao pk vel: 2.97 m/s
Height: 69 in
S' Lateral: 3.2 cm
Weight: 3791.91 oz

## 2023-02-16 LAB — FERRITIN: Ferritin: 282 ng/mL (ref 24–336)

## 2023-02-16 MED ORDER — SODIUM ZIRCONIUM CYCLOSILICATE 10 G PO PACK: 10.0000 g | PACK | Freq: Three times a day (TID) | ORAL | Status: DC

## 2023-02-16 MED ORDER — SODIUM ZIRCONIUM CYCLOSILICATE 10 G PO PACK
10.0000 g | PACK | Freq: Two times a day (BID) | ORAL | Status: DC
  Administered 2023-02-16 (×2): 10 g via ORAL
  Filled 2023-02-16 (×2): qty 1

## 2023-02-16 MED ORDER — ENSURE ENLIVE PO LIQD
237.0000 mL | Freq: Two times a day (BID) | ORAL | Status: DC
  Administered 2023-02-16 – 2023-02-19 (×7): 237 mL via ORAL

## 2023-02-16 MED ORDER — DARBEPOETIN ALFA 150 MCG/0.3ML IJ SOSY
150.0000 ug | PREFILLED_SYRINGE | INTRAMUSCULAR | Status: DC
  Administered 2023-02-16: 150 ug via SUBCUTANEOUS

## 2023-02-16 MED ORDER — SODIUM BICARBONATE 650 MG PO TABS
1300.0000 mg | ORAL_TABLET | Freq: Two times a day (BID) | ORAL | Status: DC
  Administered 2023-02-16 – 2023-02-19 (×8): 1300 mg via ORAL
  Filled 2023-02-16 (×8): qty 2

## 2023-02-16 MED ORDER — ALBUTEROL SULFATE (2.5 MG/3ML) 0.083% IN NEBU
INHALATION_SOLUTION | RESPIRATORY_TRACT | Status: AC
  Administered 2023-02-16: 2.5 mg
  Filled 2023-02-16: qty 3

## 2023-02-16 NOTE — Progress Notes (Signed)
Refused CPAP on 2 liters oxygen [place]

## 2023-02-16 NOTE — Progress Notes (Signed)
Initial Nutrition Assessment  DOCUMENTATION CODES:      INTERVENTION:  Recommend OT assessment   Change Glucerna Shake to Ensure BID   NUTRITION DIAGNOSIS:   Inadequate oral intake related to nausea, diarrhea (PTA) as evidenced by per patient/family report.   GOAL:  Patient will meet greater than or equal to 90% of their needs   MONITOR:  PO intake, Supplement acceptance, Labs, Weight trends  REASON FOR ASSESSMENT:   Malnutrition Screening Tool    ASSESSMENT: Patient is a 62 yo male from [facility]. He presents with nausea and diarrhea, AKI, metabolic acidosis. Hyponatremia.   PMH: CHF (EF 20-25%), DM-2, COPD, CAD, CKD-3a, rectal cancer, chronic diarrhea and anemia. Patient had right BKA 11/18/22 and revision 12/17/22.   Patient reports good appetite. RD provided tray set up for him. Difficulty noted with self feeding. Patient says nausea is better and last BM 4/6. Recommend OT assess self-feeding. Weights reviewed.   Medications reviewed and include: lokelma, antibiotics, insulin, lipitor, pepcid, ferrous sulfate, sodium bicarbonate 1300 mg BID. Flomax daily.   Intake/Output Summary (Last 24 hours) at 02/16/2023 1245 Last data filed at 02/16/2023 0800 Gross per 24 hour  Intake 1769.36 ml  Output 1200 ml  Net 569.36 ml        Latest Ref Rng & Units 02/16/2023    3:28 AM 02/15/2023    3:29 AM 02/14/2023   10:07 PM  BMP  Glucose 70 - 99 mg/dL 92  751  700   BUN 6 - 20 mg/dL 84  77  80   Creatinine 0.61 - 1.24 mg/dL 1.74  9.44  9.67   Sodium 135 - 145 mmol/L 132  134  132   Potassium 3.5 - 5.1 mmol/L 5.2  5.9  6.3   Chloride 98 - 111 mmol/L 95  99  98   CO2 22 - 32 mmol/L 20  12  11    Calcium 8.9 - 10.3 mg/dL 8.3  8.7  8.9        NUTRITION - FOCUSED PHYSICAL EXAM: NFPE conducted findings are mild orbital fat depletion, mild and moderate muscle muscle depletion and no edema.    Diet Order:   Diet Order             Diet renal with fluid restriction Fluid  restriction: Other (see comments); Room service appropriate? Yes; Fluid consistency: Thin  Diet effective now                   EDUCATION NEEDS:  Education needs have been addressed  Skin:  Skin Assessment: Reviewed RN Assessment- residual limb is healed   Last BM:  4/6  Height:   Ht Readings from Last 1 Encounters:  02/15/23 5\' 9"  (1.753 m)    Weight:   Wt Readings from Last 1 Encounters:  02/15/23 107.5 kg    Adjusted Ideal Body Weight:   69 kg  BMI:  Body mass index is 33 kg/m.  Estimated Nutritional Needs:   Kcal:  2100-2300  Protein:  95-99 gr  Fluid:  1.2 liters daily (per MD)  Royann Shivers MS,RD,CSG,LDN Contact: Loretha Stapler

## 2023-02-16 NOTE — Progress Notes (Signed)
PROGRESS NOTE  Shane Chambers, is a 62 y.o. male, DOB - 1961/10/14, NAT:[id number]  Admit date - 02/14/2023   Admitting Physician Frankey Shown, DO  Outpatient Primary MD for the patient is [name], MD  LOS - 2  Chief Complaint  Patient presents with   Nausea   Diarrhea      Brief Narrative:  -62 y.o. male with medical history significant of hypertension, hyperlipidemia, T2DM, GERD, COPD, CAD, hypothyroidism  admitted on 02/14/23 with AKI on CKD 3A with HyperKalemia and anion-gap metabolic acidosis   -Assessment and Plan: 1)Aki on CKD 3A-with hyperkalemia and anion gap metabolic acidosis -Suspect dyspnea secondary to contrast induced ATN (patient received contrast on 02/10/2023)-compounded by bladder outlet obstruction/chronic obstructive uropathy -Renal ultrasound without hydronephrosis or definite acute obstruction, but there is evidence of chronic obstructive uropathy as demonstrated by thickened bladder wall -Potassium was 6.9 on admission, potassium normalized with Lokelma -Creatinine on 12/18/2022 was 1.66..,  Creatinine was 1.3 on 11/28/2022 -Creatinine on admission was 6.38 on 02/14/2023 -Creatinine trending down with bicarb drip -Anion gap remains greater than 20 -Patient was initially anuric on admission----- Foley placed on 02/15/2023....  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  -Nephrology input appreciated  4)CAD--status post CABG in 03/2020---no chest pains or ACS type concerns Recent echo with preserved EF -EKG with sinus rhythm with nonspecific ST segment changes -Continue atorvastatin and metoprolol -Aspirin 81 mg daily  5)Presumed Rt Sided  community-acquired pneumonia---   imaging studies noted -PCT 0.22 -WBC 11.0>.8.9 -Continue Rocephin and doxycycline as ordered  6)Mild Proctocolitis----h/o rectal Ca -WBC and PCT as above #5 -Rocephin and Flagyl as ordered  7)PAD s/p rt BKA-- -continue atorvastatin -Aspirin 81 mg daily  8)Class 2 Obesity/OSA----CPAP nightly advised  9)HFpEF--- chronic diastolic dysfunction - Prior h/o HFpEF (20-25% which normalized post CABG),  -CHF echocardiogram done on 10/13/2022 showed LVEF of 55 to 60%.  No RWMA  -Repeat echo from 02/16/2023 with EF of 60 to 65% with moderate LVH, -Patient requires IV fluids due to AKI on CKD- -monitor closely as patient is at risk for volume overload/CHF exacerbation  10) chronic diarrhea--Imodium as needed  11) BPH with LUTs--renal ultrasound shows evidence of chronic obstructive uropathy as demonstrated by thickened bladder wall -Foley placed on 02/15/2023 -Flomax as ordered  12)DM2- A1C 6.1-reflecting excellent diabetic control PTA -Hold metformin due to kidney concerns -Use Novolog/Humalog Sliding scale insulin with Accu-Cheks/Fingersticks as ordered   13) hypothyroidism--stable,  continue levothyroxine 75 mcg daily  14)GERD--hold PPI, use Pepcid as ordered  15)COPD-no acute exacerbation at this time, continue bronchodilators, avoid steroids at this time  16)HTN--continue metoprolol 50 twice daily Restart amlodipine  17) chronic anemia in the setting of CKD -Continue iron supplementation  Status is: Inpatient   Disposition: The patient is from: SNF [place]              Anticipated d/c is to: SNF              Anticipated d/c date is: 3 days              Patient currently is not medically stable to d/c. Barriers: Not Clinically Stable-   Code Status :  -  Code Status: Full Code   Family Communication:  NA (patient is alert, awake and coherent)   DVT Prophylaxis  :   - SCDs   heparin injection 5,000 Units Start: 02/15/23 0600 SCDs  Start: 02/15/23 0154   Lab Results  Component Value Date   PLT 272 02/16/2023   Inpatient Medications  Scheduled Meds:  aspirin EC  81 mg Oral Q breakfast   atorvastatin  40 mg Oral QHS   Chlorhexidine Gluconate Cloth  6 each Topical Daily   Chlorhexidine Gluconate Cloth  6 each Topical Q0600   darbepoetin (ARANESP) injection - NON-DIALYSIS  150 mcg Subcutaneous Q Mon-1800   doxycycline  100 mg Oral Q12H   famotidine  20 mg Oral Daily   feeding supplement  237 mL Oral BID BM   ferrous sulfate  325 mg Oral Q breakfast   heparin  5,000 Units Subcutaneous Q8H   insulin aspart  0-6 Units Subcutaneous TID WC   levothyroxine  75 mcg Oral Q0600   metoprolol tartrate  50 mg Oral BID   metroNIDAZOLE  500 mg Oral Q12H   mupirocin ointment  1 Application Nasal BID   sodium bicarbonate  1,300 mg Oral BID   sodium zirconium cyclosilicate  10 g Oral BID   tamsulosin  0.8 mg Oral QPM   Continuous Infusions:  cefTRIAXone (ROCEPHIN)  IV 1 g (02/16/23 1434)   PRN Meds:.acetaminophen **OR** acetaminophen, ipratropium-albuterol, loperamide, prochlorperazine   Anti-infectives (From admission, onward)    Start     Dose/Rate Route Frequency Ordered Stop   02/15/23 1430  cefTRIAXone (ROCEPHIN) 1 g in sodium chloride 0.9 % 100 mL IVPB        1 g 200 mL/hr over 30 Minutes Intravenous Every 24 hours 02/15/23 1341     02/15/23 1430  doxycycline (VIBRA-TABS) tablet 100 mg        100 mg Oral Every 12 hours 02/15/23 1341     02/15/23 1430  metroNIDAZOLE (FLAGYL) tablet 500 mg        500 mg Oral Every 12 hours 02/15/23 1341        Subjective: Shane Chambers today has no fevers, no emesis,  No chest pain,   - -Improving urine output -Oral intake is fair -No abdominal pain   Objective: Vitals:   02/16/23 1200 02/16/23 1300 02/16/23 1400 02/16/23 1608  BP: (!) 132/55 (!) 158/67 (!) 146/97   Pulse: 74 69 68   Resp: (!) 24 19 20    Temp:    98.1 F (36.7 C)  TempSrc:    Oral  SpO2: 93% 95%  (!) 86%   Weight:      Height:        Intake/Output Summary (Last 24 hours) at 02/16/2023 1756 Last data filed at 02/16/2023 0800 Gross per 24 hour  Intake 1769.36 ml  Output 1200 ml  Net 569.36 ml   Filed Weights   02/14/23 1527 02/15/23 0200  Weight: 106.1 kg 107.5 kg    Physical Exam  Gen:- Awake Alert,  in no apparent distress  HEENT:- [place].AT, No sclera icterus Neck-Supple Neck,No JVD,.  Lungs-somewhat diminished breath sounds without wheezing CV- S1, S2 normal, regular , increased truncal adiposity Abd-  +ve B.Sounds, Abd Soft, No tenderness,    Extremity/Skin: Left lower extremity pitting edema, pedal pulses present on Lt, Rt BKA Psych-affect is appropriate, oriented x3 Neuro-generalized weakness, no new focal deficits, no tremors   Data Reviewed: I have personally reviewed following labs and imaging studies  CBC: Recent Labs  Lab 02/14/23 1640 02/16/23 0530  WBC 11.0* 8.9  NEUTROABS 9.2*  --   HGB 8.6* 8.2*  HCT 27.1* 25.3*  MCV 90.6 88.5  PLT 319 272   Basic Metabolic Panel: Recent Labs  Lab 02/14/23 1755 02/14/23 2207 02/15/23 0329 02/16/23 0328  NA 131* 132* 134* 132*  K 6.9* 6.3* 5.9* 5.2*  CL 96* 98 99 95*  CO2 15* 11* 12* 20*  GLUCOSE 69* 107* 150* 92  BUN 78* 80* 77* 84*  CREATININE 6.03* 6.38* 6.24* 7.18*  CALCIUM 9.0 8.9 8.7* 8.3*  MG  --   --  1.7  --   PHOS  --   --  7.3* 6.6*   GFR: Estimated Creatinine Clearance: 13.2 mL/min (A) (by C-G formula based on SCr of 7.18 mg/dL (H)). Liver Function Tests: Recent Labs  Lab 02/14/23 1755 02/15/23 0329 02/16/23 0328  AST 19 21  --   ALT 11 11  --   ALKPHOS 126 108  --   BILITOT 0.7 0.7  --   PROT 7.6 6.9  --   ALBUMIN 3.3* 3.1* 3.1*   Radiology Studies: ECHOCARDIOGRAM LIMITED  Result Date: 02/16/2023    ECHOCARDIOGRAM LIMITED REPORT   Patient Name:   Shane Chambers Date of Exam: 02/16/2023 Medical Rec #:  [id number]       Height:       69.0 in Accession #:    [id number]      Weight:        237.0 lb Date of Birth:  1961-03-27       BSA:          2.221 m Patient Age:    62 years        BP:           155/69 mmHg Patient Gender: M               HR:           80 bpm. [facility] Procedure: Limited Echo, Cardiac Doppler and Limited Color Doppler Indications:    CHF-Acute Systolic I50.21  History:        Patient has prior history of Echocardiogram examinations, most                 recent 10/13/2022. Cardiomyopathy and CHF, CAD, Prior CABG; Risk                 Factors:Hypertension, Diabetes and Dyslipidemia.  Sonographer:    Celesta Gentile RCS Referring Phys: [id number] OLADAPO ADEFESO IMPRESSIONS  1. Left ventricular ejection fraction, by estimation, is 60 to 65%. The left ventricle has normal function. Left ventricular endocardial border not optimally defined to evaluate regional wall motion. There is moderate left ventricular hypertrophy.  2. Right ventricular systolic function is normal. The right ventricular size is normal.  3. The aortic valve has an indeterminant number of cusps. There is mild calcification of the aortic valve. There is mild thickening of the aortic valve. Aortic valve regurgitation is not visualized. Moderate aortic valve stenosis. Aortic valve mean gradient measures 20.7 mmHg. Aortic valve peak gradient measures 35.3 mmHg. Aortic valve area, by VTI measures 1.17 cm.  FINDINGS  Left Ventricle: Left ventricular ejection fraction, by estimation, is 60 to 65%. The left ventricle has normal function. Left ventricular endocardial border not optimally defined to evaluate regional wall motion. There is moderate left ventricular hypertrophy. Right Ventricle: The right ventricular size is normal. Right vetricular wall thickness was not well visualized. Right ventricular systolic function is  normal. Pericardium: There is no evidence of pericardial effusion. Mitral Valve: There is mild thickening of the mitral valve leaflet(s). There is mild calcification of the mitral valve  leaflet(s). Mild mitral annular calcification. Aortic Valve: The aortic valve has an indeterminant number of cusps. There is mild calcification of the aortic valve. There is mild thickening of the aortic valve. There is mild aortic valve annular calcification. Aortic valve regurgitation is not visualized. Moderate aortic stenosis is present. Aortic valve mean gradient measures 20.7 mmHg. Aortic valve peak gradient measures 35.3 mmHg. Aortic valve area, by VTI measures 1.17 cm. Aorta: The aortic root is normal in size and structure. LEFT VENTRICLE PLAX 2D LVIDd:         5.00 cm LVIDs:         3.20 cm LV PW:         1.40 cm LV IVS:        1.40 cm LVOT diam:     2.10 cm LV SV:         82 LV SV Index:   37 LVOT Area:     3.46 cm  RIGHT VENTRICLE TAPSE (M-mode): 1.6 cm LEFT ATRIUM         Index LA diam:    5.40 cm 2.43 cm/m  AORTIC VALVE AV Area (Vmax):    1.09 cm AV Area (Vmean):   1.20 cm AV Area (VTI):     1.17 cm AV Vmax:           297.00 cm/s AV Vmean:          216.333 cm/s AV VTI:            0.699 m AV Peak Grad:      35.3 mmHg AV Mean Grad:      20.7 mmHg LVOT Vmax:         93.10 cm/s LVOT Vmean:        75.200 cm/s LVOT VTI:          0.236 m LVOT/AV VTI ratio: 0.34  AORTA Ao Root diam: 3.40 cm  SHUNTS Systemic VTI:  0.24 m Systemic Diam: 2.10 cm Dina Rich MD Electronically signed by Dina Rich MD Signature Date/Time: 02/16/2023/11:54:15 AM    Final    DG Chest 1 View  Result Date: 02/16/2023 CLINICAL DATA:  Shortness of breath EXAM: PORTABLE CHEST 1 VIEW COMPARISON:  02/15/2023 FINDINGS: Cardiac shadow is mildly enlarged. Postsurgical changes are again seen. Patient is rotated to the left accentuating the mediastinal markings. The lungs are clear. No bony abnormality is noted. Mild residual central vascular prominence is noted. IMPRESSION: Stable central vascular prominence.  No acute abnormality noted. Electronically Signed   By: Alcide Clever M.D.   On: 02/16/2023 01:03   US RENAL  Result  Date: 02/15/2023 CLINICAL DATA:  [id number] AKI (acute kidney injury) [id number] EXAM: RENAL / URINARY TRACT ULTRASOUND COMPLETE COMPARISON:  October 05, 2022, February 14, 2023 FINDINGS: Right Kidney: Renal measurements: 14.5 x 7.6 x 7.7 cm = volume: 442 mL. Echogenicity within normal limits. No mass or hydronephrosis visualized. Left Kidney: Renal measurements: 12.8 x 6 x 7 x 7.0 cm = volume: 315 mL. Echogenicity within normal limits. No mass or hydronephrosis visualized. Bladder: Mildly prominent bladder walls for degree of distension. Other: Trace perinephric fluid adjacent to the inferior pole the LEFT kidney. IMPRESSION: 1. No hydronephrosis. 2. Trace perinephric fluid adjacent to the inferior pole the LEFT kidney, as seen on recent CT. 3. Mildly prominent bladder walls for  degree of distension, likely reflecting sequela of chronic outlet obstruction. Electronically Signed   By: Meda Klinefelter M.D.   On: 02/15/2023 11:00   DG CHEST PORT 1 VIEW  Result Date: 02/15/2023 CLINICAL DATA:  50-year-old male with history of acute kidney injury. EXAM: PORTABLE CHEST - 1 VIEW COMPARISON:  None Available. FINDINGS: Unchanged cardiomegaly. Low lung volumes. Bilateral pulmonary vascular crowding. Hazy bibasilar pulmonary opacities. No evidence of pleural effusion or pneumothorax. No acute osseous abnormality. Similar appearance of multiple fractured median sternotomy wires. IMPRESSION: Cardiomegaly with low lung volumes and pulmonary vascular crowding, no definite evidence of overt pulmonary edema. Electronically Signed   By: Marliss Coots M.D.   On: 02/15/2023 08:10   CT ABDOMEN PELVIS WO CONTRAST  Result Date: 02/14/2023 CLINICAL DATA:  Nausea, vomiting, diarrhea that increased yesterday. EXAM: CT ABDOMEN AND PELVIS WITHOUT CONTRAST TECHNIQUE: Multidetector CT imaging of the abdomen and pelvis was performed following the standard protocol without IV contrast. RADIATION DOSE REDUCTION: This exam was performed according to  the departmental dose-optimization program which includes automated exposure control, adjustment of the mA and/or kV according to patient size and/or use of iterative reconstruction technique. COMPARISON:  02/10/2023 FINDINGS: Lower chest: Interlobular septal thickening in the lower lungs. Scattered centrilobular micro nodules. More dense consolidation in the right middle lobe. Small right and trace left pleural effusions. Coronary artery calcification. Hepatobiliary: Unremarkable noncontrast appearance of the liver. Cholelithiasis. No evidence of cholecystitis. Pancreas: Unremarkable. Spleen: Unremarkable. Adrenals/Urinary Tract: Normal adrenal glands. Increased perinephric stranding bilaterally compared with 02/10/2023. No urinary calculi or hydronephrosis. Unremarkable bladder. Stomach/Bowel: Normal caliber large and small bowel. Borderline wall thickening of the sigmoid colon and rectum. Stomach is within normal limits. Vascular/Lymphatic: Aortic atherosclerosis. No enlarged abdominal or pelvic lymph nodes. Reproductive: Unremarkable. Other: Mild mesenteric edema and trace free fluid in the pelvis. No free intraperitoneal air. Musculoskeletal: Body wall edema.  No acute osseous abnormality. IMPRESSION: 1. Possible mild proctocolitis. 2. Increased nonspecific perinephric stranding compared to 02/10/2023. This may be related to fluid status/heart failure given pulmonary, mesenteric and body wall edema. 3. Interstitial pulmonary edema with small right and trace left pleural effusions. 4. Consolidation in the right middle lobe may reflect atelectasis or pneumonia. Aortic Atherosclerosis (ICD10-I70.0). Electronically Signed   By: Minerva Fester M.D.   On: 02/14/2023 20:51    Scheduled Meds:  aspirin EC  81 mg Oral Q breakfast   atorvastatin  40 mg Oral QHS   Chlorhexidine Gluconate Cloth  6 each Topical Daily   Chlorhexidine Gluconate Cloth  6 each Topical Q0600   darbepoetin (ARANESP) injection -  NON-DIALYSIS  150 mcg Subcutaneous Q Mon-1800   doxycycline  100 mg Oral Q12H   famotidine  20 mg Oral Daily   feeding supplement  237 mL Oral BID BM   ferrous sulfate  325 mg Oral Q breakfast   heparin  5,000 Units Subcutaneous Q8H   insulin aspart  0-6 Units Subcutaneous TID WC   levothyroxine  75 mcg Oral Q0600   metoprolol tartrate  50 mg Oral BID   metroNIDAZOLE  500 mg Oral Q12H   mupirocin ointment  1 Application Nasal BID   sodium bicarbonate  1,300 mg Oral BID   sodium zirconium cyclosilicate  10 g Oral BID   tamsulosin  0.8 mg Oral QPM   Continuous Infusions:  cefTRIAXone (ROCEPHIN)  IV 1 g (02/16/23 1434)    LOS: 2 days   Shon Hale M.D on 02/16/2023 at 5:56 PM  Go to [email or web address] -  for contact info  [facility] - Office  [phone]  If 7PM-7AM, please contact night-coverage [email or web address] 02/16/2023, 5:56 PM

## 2023-02-16 NOTE — Progress Notes (Signed)
Patient has been refusing CPAP. 

## 2023-02-16 NOTE — Progress Notes (Signed)
Subjective:  appears pensive-  only c/o pain at his penis from foley -  reasonable UOP-  bicarb and k better-  crt not yet  Objective Vital signs in last 24 hours: Vitals:   02/16/23 0300 02/16/23 0400 02/16/23 0500 02/16/23 0600  BP: (!) 146/62 (!) 141/65 (!) 153/81 (!) 155/69  Pulse: 72 80 79 80  Resp: 19 (!) 25 (!) 25 (!) 29  Temp:      TempSrc:      SpO2: 94% 92% 92% 92%  Weight:      Height:       Weight change:   Intake/Output Summary (Last 24 hours) at 02/16/2023 0813 Last data filed at 02/16/2023 0600 Gross per 24 hour  Intake 1589.36 ml  Output 1200 ml  Net 389.36 ml    Assessment/Plan: 62 yo male with HTN, DM, COPD, CAD, rectal cancer, PAD and OSA as well as CKD ( baseline crt high1's)  Now with A on CRF Renal failure with hyperkalemia - u/s= thickened bladder wall consistent with chronic obstructive uropathy even though no hydronephrosis. Patient likely in ATN secondary to volume depletion and contrast nephropathy;s/p 35mL of Ominipaque on 02/10/2023  - Lokelma 10gm TID. - D5W + 3amps HCO3 ->  76ml/hr  - Need to get a foley for more strict I&O's as well as high residual on bladder scan which shows but may be more. Patient had not voided since presentation; after educating the patient is willing to have the foley placed. Seems like adequate urine at this time. Hopefully he will start recovering renal function before needing dialysis. He is willing to have iHD if that became necessary and he understands that may still be possible in the next 24-48hrs.  No absolute indications, potassium and bicarb are improved and making urine-  will cont to watch- will stop iv bicarb-  put on oral-  also decrease lokelma dosing    -Maintain MAP>65 for optimal renal perfusion.  - Continue strict Input and Output monitoring. Will monitor the patient closely with you and intervene or adjust therapy as indicated by changes in clinical status/labs   Hyponatremia secondary to renal failure + long  standing h/o diarrhea. Getting isotonic fluid resuscitation- is stable. 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 Will also add ESA   CAP with findings suggestive of such on CT DM - per primary BPH on Flomax refusing a foley initially but now in place   [employer]    Labs: Basic Metabolic Panel: Recent Labs  Lab 02/14/23 2207 02/15/23 0329 02/16/23 0328  NA 132* 134* 132*  K 6.3* 5.9* 5.2*  CL 98 99 95*  CO2 11* 12* 20*  GLUCOSE 107* 150* 92  BUN 80* 77* 84*  CREATININE 6.38* 6.24* 7.18*  CALCIUM 8.9 8.7* 8.3*  PHOS  --  7.3* 6.6*   Liver Function Tests: Recent Labs  Lab 02/14/23 1755 02/15/23 0329 02/16/23 0328  AST 19 21  --   ALT 11 11  --   ALKPHOS 126 108  --   BILITOT 0.7 0.7  --   PROT 7.6 6.9  --   ALBUMIN 3.3* 3.1* 3.1*   Recent Labs  Lab 02/14/23 1755  LIPASE 37   No results for input(s): "AMMONIA" in the last 168 hours. CBC: Recent Labs  Lab 02/14/23 1640 02/16/23 0530  WBC 11.0* 8.9  NEUTROABS 9.2*  --   HGB 8.6* 8.2*  HCT 27.1* 25.3*  MCV 90.6 88.5  PLT 319 272   Cardiac Enzymes: No results for input(s): "CKTOTAL", "CKMB", "CKMBINDEX", "TROPONINI" in the last 168 hours. CBG: Recent Labs  Lab 02/15/23 1126 02/15/23 1615 02/15/23 2110 02/15/23 2334 02/16/23 0752  GLUCAP 146* 111* 96 120* 87    Iron Studies: No results for input(s): "IRON", "TIBC", "TRANSFERRIN", "FERRITIN" in the last 72 hours. Studies/Results: DG Chest 1 View  Result Date: 02/16/2023 CLINICAL DATA:  Shortness of breath EXAM: PORTABLE CHEST 1 VIEW COMPARISON:  02/15/2023 FINDINGS: Cardiac shadow is mildly enlarged. Postsurgical changes are again seen. Patient is rotated to the left accentuating the mediastinal markings. The lungs are clear. No bony abnormality is noted. Mild residual central vascular prominence is noted. IMPRESSION: Stable central vascular prominence.  No acute abnormality noted.  Electronically Signed   By: Alcide Clever M.D.   On: 02/16/2023 01:03   US RENAL  Result Date: 02/15/2023 CLINICAL DATA:  [id number] AKI (acute kidney injury) [id number] EXAM: RENAL / URINARY TRACT ULTRASOUND COMPLETE COMPARISON:  October 05, 2022, February 14, 2023 FINDINGS: Right Kidney: Renal measurements: 14.5 x 7.6 x 7.7 cm = volume: 442 mL. Echogenicity within normal limits. No mass or hydronephrosis visualized. Left Kidney: Renal measurements: 12.8 x 6 x 7 x 7.0 cm = volume: 315 mL. Echogenicity within normal limits. No mass or hydronephrosis visualized. Bladder: Mildly prominent bladder walls for degree of distension. Other: Trace perinephric fluid adjacent to the inferior pole the LEFT kidney. IMPRESSION: 1. No hydronephrosis. 2. Trace perinephric fluid adjacent to the inferior pole the LEFT kidney, as seen on recent CT. 3. Mildly prominent bladder walls for degree of distension, likely reflecting sequela of chronic outlet obstruction. Electronically Signed   By: Meda Klinefelter M.D.   On: 02/15/2023 11:00   DG CHEST PORT 1 VIEW  Result Date: 02/15/2023 CLINICAL DATA:  68-year-old male with history of acute kidney injury. EXAM: PORTABLE CHEST - 1 VIEW COMPARISON:  None Available. FINDINGS: Unchanged cardiomegaly. Low lung volumes. Bilateral pulmonary vascular crowding. Hazy bibasilar pulmonary opacities. No evidence of pleural effusion or pneumothorax. No acute osseous abnormality. Similar appearance of multiple fractured median sternotomy wires. IMPRESSION: Cardiomegaly with low lung volumes and pulmonary vascular crowding, no definite evidence of overt pulmonary edema. Electronically Signed   By: Marliss Coots M.D.   On: 02/15/2023 08:10   CT ABDOMEN PELVIS WO CONTRAST  Result Date: 02/14/2023 CLINICAL DATA:  Nausea, vomiting, diarrhea that increased yesterday. EXAM: CT ABDOMEN AND PELVIS WITHOUT CONTRAST TECHNIQUE: Multidetector CT imaging of the abdomen and pelvis was performed following the standard  protocol without IV contrast. RADIATION DOSE REDUCTION: This exam was performed according to the departmental dose-optimization program which includes automated exposure control, adjustment of the mA and/or kV according to patient size and/or use of iterative reconstruction technique. COMPARISON:  02/10/2023 FINDINGS: Lower chest: Interlobular septal thickening in the lower lungs. Scattered centrilobular micro nodules. More dense consolidation in the right middle lobe. Small right and trace left pleural effusions. Coronary artery calcification. Hepatobiliary: Unremarkable noncontrast appearance of the liver. Cholelithiasis. No evidence of cholecystitis. Pancreas: Unremarkable. Spleen: Unremarkable. Adrenals/Urinary Tract: Normal adrenal glands. Increased perinephric stranding bilaterally compared with 02/10/2023. No urinary calculi or hydronephrosis. Unremarkable bladder. Stomach/Bowel: Normal caliber large and small bowel. Borderline wall thickening of the sigmoid colon and rectum. Stomach is within normal limits. Vascular/Lymphatic: Aortic atherosclerosis. No enlarged abdominal or pelvic lymph nodes. Reproductive: Unremarkable. Other: Mild mesenteric edema and trace free fluid in the pelvis. No free intraperitoneal air.  Musculoskeletal: Body wall edema.  No acute osseous abnormality. IMPRESSION: 1. Possible mild proctocolitis. 2. Increased nonspecific perinephric stranding compared to 02/10/2023. This may be related to fluid status/heart failure given pulmonary, mesenteric and body wall edema. 3. Interstitial pulmonary edema with small right and trace left pleural effusions. 4. Consolidation in the right middle lobe may reflect atelectasis or pneumonia. Aortic Atherosclerosis (ICD10-I70.0). Electronically Signed   By: Minerva Festeryler  Stutzman M.D.   On: 02/14/2023 20:51   Medications: Infusions:  cefTRIAXone (ROCEPHIN)  IV 1 g (02/15/23 1449)   sodium bicarbonate 150 mEq in dextrose 5 % 1,150 mL infusion 75 mL/hr at  02/16/23 0349    Scheduled Medications:  aspirin EC  81 mg Oral Q breakfast   atorvastatin  40 mg Oral QHS   Chlorhexidine Gluconate Cloth  6 each Topical Daily   Chlorhexidine Gluconate Cloth  6 each Topical Q0600   doxycycline  100 mg Oral Q12H   famotidine  20 mg Oral Daily   feeding supplement (GLUCERNA SHAKE)  237 mL Oral TID BM   ferrous sulfate  325 mg Oral Q breakfast   heparin  5,000 Units Subcutaneous Q8H   insulin aspart  0-6 Units Subcutaneous TID WC   levothyroxine  75 mcg Oral Q0600   metoprolol tartrate  50 mg Oral BID   metroNIDAZOLE  500 mg Oral Q12H   mupirocin ointment  1 Application Nasal BID   sodium zirconium cyclosilicate  10 g Oral TID   tamsulosin  0.8 mg Oral QPM    have reviewed scheduled and prn medications.  Physical Exam: General: obese- sweaty-  pensive but NAD Heart: RRR Lungs: CBS bilat  Abdomen: soft, non tender Extremities: no edema  Dialysis Access: left upper arm AVF    02/16/2023,8:13 AM  LOS: 2 days

## 2023-02-16 NOTE — Progress Notes (Signed)
*  PRELIMINARY RESULTS* Echocardiogram Limited 2-D echocardiogram  has been performed.  Stacey Drain 02/16/2023, 9:31 AM

## 2023-02-17 ENCOUNTER — Inpatient Hospital Stay: Payer: Medicare Other | Admitting: Hematology

## 2023-02-17 DIAGNOSIS — E8729 Other acidosis: Secondary | ICD-10-CM | POA: Diagnosis not present

## 2023-02-17 LAB — CBC
HCT: 25.2 % — ABNORMAL LOW (ref 39.0–52.0)
Hemoglobin: 8.1 g/dL — ABNORMAL LOW (ref 13.0–17.0)
MCH: 28.8 pg (ref 26.0–34.0)
MCHC: 32.1 g/dL (ref 30.0–36.0)
MCV: 89.7 fL (ref 80.0–100.0)
Platelets: 272 10*3/uL (ref 150–400)
RBC: 2.81 MIL/uL — ABNORMAL LOW (ref 4.22–5.81)
RDW: 12.8 % (ref 11.5–15.5)
WBC: 7.2 10*3/uL (ref 4.0–10.5)
nRBC: 0 % (ref 0.0–0.2)

## 2023-02-17 LAB — RENAL FUNCTION PANEL
Albumin: 2.9 g/dL — ABNORMAL LOW (ref 3.5–5.0)
Anion gap: 12 (ref 5–15)
BUN: 82 mg/dL — ABNORMAL HIGH (ref 6–20)
CO2: 26 mmol/L (ref 22–32)
Calcium: 8.4 mg/dL — ABNORMAL LOW (ref 8.9–10.3)
Chloride: 95 mmol/L — ABNORMAL LOW (ref 98–111)
Creatinine, Ser: 7.33 mg/dL — ABNORMAL HIGH (ref 0.61–1.24)
GFR, Estimated: 8 mL/min — ABNORMAL LOW (ref >60)
Glucose, Bld: 99 mg/dL (ref 70–99)
Phosphorus: 7.2 mg/dL — ABNORMAL HIGH (ref 2.5–4.6)
Potassium: 4.2 mmol/L (ref 3.5–5.1)
Sodium: 133 mmol/L — ABNORMAL LOW (ref 135–145)

## 2023-02-17 LAB — GLUCOSE, CAPILLARY
Glucose-Capillary: 118 mg/dL — ABNORMAL HIGH (ref 70–99)
Glucose-Capillary: 103 mg/dL — ABNORMAL HIGH (ref 70–99)
Glucose-Capillary: 141 mg/dL — ABNORMAL HIGH (ref 70–99)
Glucose-Capillary: 164 mg/dL — ABNORMAL HIGH (ref 70–99)

## 2023-02-17 MED ORDER — AMLODIPINE BESYLATE 5 MG PO TABS
10.0000 mg | ORAL_TABLET | Freq: Every day | ORAL | Status: DC
  Administered 2023-02-17 – 2023-02-19 (×3): 10 mg via ORAL
  Filled 2023-02-17 (×3): qty 2

## 2023-02-17 MED ORDER — SODIUM CHLORIDE 0.9 % IV SOLN
250.0000 mg | Freq: Every day | INTRAVENOUS | Status: DC
  Administered 2023-02-17 – 2023-02-19 (×3): 250 mg via INTRAVENOUS
  Filled 2023-02-17 (×4): qty 20

## 2023-02-17 NOTE — Progress Notes (Addendum)
PROGRESS NOTE  Shane Chambers, is a 62 y.o. male, DOB - 1961-05-12, VXB:[id number]  Admit date - 02/14/2023   Admitting Physician Frankey Shown, DO  Outpatient Primary MD for the patient is Shane Pander, MD  LOS - 3  Chief Complaint  Patient presents with   Nausea   Diarrhea      Brief Narrative:  -62 y.o. male with medical history significant of hypertension, hyperlipidemia, T2DM, GERD, COPD, CAD, hypothyroidism  admitted on 02/14/23 with AKI on CKD 3A with HyperKalemia and anion-gap metabolic acidosis. --Nephrology team would like to hold off on hemodialysis at this time to allow time for possible spontaneous renal recovery -Patient verbalizes understanding that if he does not have significant spontaneous renal recovery he may need hemodialysis this admission   -Assessment and Plan: 1)Aki on CKD 3A-with hyperkalemia and anion gap metabolic acidosis -Suspect dyspnea secondary to contrast induced ATN (patient received contrast on 02/10/2023)-compounded by bladder outlet obstruction/chronic obstructive uropathy -Renal ultrasound without hydronephrosis or definite acute obstruction, but there is evidence of chronic obstructive uropathy as demonstrated by thickened bladder wall -Potassium was 6.9 on admission, potassium normalized with Lokelma -Creatinine on 12/18/2022 was 1.66..,  Creatinine was 1.3 on 11/28/2022 -Creatinine trending down with bicarb drip -Anion gap was greater than 20 on admission, anion gap has now normalized -Patient was initially anuric on admission----- Foley placed on 02/15/2023....  -Patient now having really good urine output -Stopped Torsemide -Stop Metformin --Bicarb was 11 on 02/14/2023----improving with bicarb drip--Bicarb on 02/17/2023 is up to 26 -Creatinine was 6.0 on admission (02/14/23) creatinine currently up to 7.33 -Okay to discontinue bicarb drip and give oral bicarb supplementation --renally adjust medications, avoid nephrotoxic agents / dehydration  /  hypotension -Nephrology team would like to hold off on hemodialysis at this time to allow time for possible spontaneous renal recovery -Patient verbalizes understanding that if he does not have significant spontaneous renal recovery he may need hemodialysis this admission  Intake/Output Summary (Last 24 hours) at 02/17/2023 1646 Last data filed at 02/17/2023 1421 Gross per 24 hour  Intake 1520 ml  Output 6050 ml  Net -4530 ml     2)HypoNatremia--- sodium on admission was 131, sodium is currently up to 133 -IV fluid bicarb drip has been discontinued  3)Hyperphosphatemia and 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  -Nephrology input appreciated  4)CAD--status post CABG in 03/2020---no chest pains or ACS type concerns Recent echo with preserved EF -EKG with sinus rhythm with nonspecific ST segment changes -Continue atorvastatin and metoprolol -Aspirin 81 mg daily  5)Presumed Rt Sided community-acquired pneumonia---   imaging studies noted -PCT 0.22 -WBC 11.0>.8.9>>7.2 -Continue Rocephin and doxycycline as ordered  6)Mild Proctocolitis----h/o rectal Ca -WBC and PCT as above #5 -Continue Rocephin and Flagyl as ordered  7)PAD s/p rt BKA-- -continue atorvastatin -Aspirin 81 mg daily  8)Class 2 Obesity/OSA----CPAP nightly advised  9)HFpEF--- chronic diastolic dysfunction - Prior h/o HFpEF (20-25% which normalized post CABG),  -CHF echocardiogram done on 10/13/2022 showed LVEF of 55 to 60%.  No RWMA  -Repeat echo from 02/16/2023 with EF of 60 to 65% with moderate LVH, -Patient required IV fluids due to AKI on CKD- -monitor closely as patient is at risk for volume overload/CHF exacerbation  10) chronic diarrhea--Imodium as needed  11) BPH with LUTs--renal ultrasound shows evidence of chronic obstructive uropathy as demonstrated by thickened bladder wall -Foley placed on 02/15/2023 -Flomax as ordered  12)DM2- A1C 6.1-reflecting excellent diabetic control PTA -Hold metformin due to  kidney concerns -Use  Novolog/Humalog Sliding scale insulin with Accu-Cheks/Fingersticks as ordered   13) hypothyroidism--stable,  continue levothyroxine 75 mcg daily  14)GERD--hold PPI, use Pepcid as ordered  15)COPD-no acute exacerbation at this time, continue bronchodilators, avoid steroids at this time  16)HTN--continue metoprolol 50 twice daily Restart amlodipine  17) chronic anemia in the setting of CKD -Continue iron supplementation -Patient received IV iron on 02/17/2023  Status is: Inpatient   Disposition: The patient is from: SNF [place]              Anticipated d/c is to: SNF              Anticipated d/c date is: 3 days              Patient currently is not medically stable to d/c. Barriers: Not Clinically Stable-   Code Status :  -  Code Status: Full Code   Family Communication:    NA (patient is alert, awake and coherent)   DVT Prophylaxis  :   - SCDs   heparin injection 5,000 Units Start: 02/15/23 0600 SCDs Start: 02/15/23 0154   Lab Results  Component Value Date   PLT 272 02/17/2023   Inpatient Medications  Scheduled Meds:  amLODipine  10 mg Oral Daily   aspirin EC  81 mg Oral Q breakfast   atorvastatin  40 mg Oral QHS   Chlorhexidine Gluconate Cloth  6 each Topical Daily   Chlorhexidine Gluconate Cloth  6 each Topical Q0600   darbepoetin (ARANESP) injection - NON-DIALYSIS  150 mcg Subcutaneous Q Mon-1800   doxycycline  100 mg Oral Q12H   famotidine  20 mg Oral Daily   feeding supplement  237 mL Oral BID BM   heparin  5,000 Units Subcutaneous Q8H   insulin aspart  0-6 Units Subcutaneous TID WC   levothyroxine  75 mcg Oral Q0600   metoprolol tartrate  50 mg Oral BID   metroNIDAZOLE  500 mg Oral Q12H   mupirocin ointment  1 Application Nasal BID   sodium bicarbonate  1,300 mg Oral BID   tamsulosin  0.8 mg Oral QPM   Continuous Infusions:  cefTRIAXone (ROCEPHIN)  IV 1 g (02/17/23 1455)   ferric gluconate (FERRLECIT) IVPB 250 mg (02/17/23 1014)    PRN Meds:.acetaminophen **OR** acetaminophen, ipratropium-albuterol, loperamide, prochlorperazine   Anti-infectives (From admission, onward)    Start     Dose/Rate Route Frequency Ordered Stop   02/15/23 1430  cefTRIAXone (ROCEPHIN) 1 g in sodium chloride 0.9 % 100 mL IVPB        1 g 200 mL/hr over 30 Minutes Intravenous Every 24 hours 02/15/23 1341     02/15/23 1430  doxycycline (VIBRA-TABS) tablet 100 mg        100 mg Oral Every 12 hours 02/15/23 1341     02/15/23 1430  metroNIDAZOLE (FLAGYL) tablet 500 mg        500 mg Oral Every 12 hours 02/15/23 1341        Subjective: Steele SizerWilliam Hoots today has no fevers, no emesis,  No chest pain,   - -Resting comfortably -No abdominal pain, no new concerns  Objective: Vitals:   02/17/23 0900 02/17/23 1000 02/17/23 1100 02/17/23 1200  BP: (!) 150/65 (!) 165/55 (!) 166/66 (!) 165/65  Pulse: 66 68 73 72  Resp: 18 (!) 34 20 17  Temp:      TempSrc:      SpO2: 98% 98% 94% 94%  Weight:      Height:  Intake/Output Summary (Last 24 hours) at 02/17/2023 1643 Last data filed at 02/17/2023 1421 Gross per 24 hour  Intake 1520 ml  Output 6050 ml  Net -4530 ml   Filed Weights   02/14/23 1527 02/15/23 0200 02/17/23 0400  Weight: 106.1 kg 107.5 kg 107.7 kg   Physical Exam Gen:- Awake Alert,  in no apparent distress , speaking in sentences HEENT:- [place].AT, No sclera icterus Neck-Supple Neck,No JVD,.  Lungs-improving abdomen, no significant wheezing  CV- S1, S2 normal, regular , increased truncal adiposity Abd-  +ve B.Sounds, Abd Soft, No tenderness,    Extremity/Skin: Left lower extremity pitting edema, pedal pulses present on Lt, Rt BKA Psych-affect is appropriate, oriented x3 Neuro-generalized weakness, no new focal deficits, no tremors  Data Reviewed: I have personally reviewed following labs and imaging studies  CBC: Recent Labs  Lab 02/14/23 1640 02/16/23 0530 02/17/23 0515  WBC 11.0* 8.9 7.2  NEUTROABS 9.2*  --   --    HGB 8.6* 8.2* 8.1*  HCT 27.1* 25.3* 25.2*  MCV 90.6 88.5 89.7  PLT 319 272 272   Basic Metabolic Panel: Recent Labs  Lab 02/14/23 1755 02/14/23 2207 02/15/23 0329 02/16/23 0328 02/17/23 0515  NA 131* 132* 134* 132* 133*  K 6.9* 6.3* 5.9* 5.2* 4.2  CL 96* 98 99 95* 95*  CO2 15* 11* 12* 20* 26  GLUCOSE 69* 107* 150* 92 99  BUN 78* 80* 77* 84* 82*  CREATININE 6.03* 6.38* 6.24* 7.18* 7.33*  CALCIUM 9.0 8.9 8.7* 8.3* 8.4*  MG  --   --  1.7  --   --   PHOS  --   --  7.3* 6.6* 7.2*   GFR: Estimated Creatinine Clearance: 13 mL/min (A) (by C-G formula based on SCr of 7.33 mg/dL (H)). Liver Function Tests: Recent Labs  Lab 02/14/23 1755 02/15/23 0329 02/16/23 0328 02/17/23 0515  AST 19 21  --   --   ALT 11 11  --   --   ALKPHOS 126 108  --   --   BILITOT 0.7 0.7  --   --   PROT 7.6 6.9  --   --   ALBUMIN 3.3* 3.1* 3.1* 2.9*   Radiology Studies: ECHOCARDIOGRAM LIMITED  Result Date: 02/16/2023    ECHOCARDIOGRAM LIMITED REPORT   Patient Name:   KAMERYN TISDEL Date of Exam: 02/16/2023 Medical Rec #:  [id number]       Height:       69.0 in Accession #:    [id number]      Weight:       237.0 lb Date of Birth:  1960/12/21       BSA:          2.221 m Patient Age:    62 years        BP:           155/69 mmHg Patient Gender: M               HR:           80 bpm. [facility] Procedure: Limited Echo, Cardiac Doppler and Limited Color Doppler Indications:    CHF-Acute Systolic I50.21  History:        Patient has prior history of Echocardiogram examinations, most                 recent 10/13/2022. Cardiomyopathy and CHF, CAD, Prior CABG; Risk  Factors:Hypertension, Diabetes and Dyslipidemia.  Sonographer:    Celesta Gentile RCS Referring Phys: [id number] OLADAPO ADEFESO IMPRESSIONS  1. Left ventricular ejection fraction, by estimation, is 60 to 65%. The left ventricle has normal function. Left ventricular endocardial border not optimally defined to evaluate regional wall  motion. There is moderate left ventricular hypertrophy.  2. Right ventricular systolic function is normal. The right ventricular size is normal.  3. The aortic valve has an indeterminant number of cusps. There is mild calcification of the aortic valve. There is mild thickening of the aortic valve. Aortic valve regurgitation is not visualized. Moderate aortic valve stenosis. Aortic valve mean gradient measures 20.7 mmHg. Aortic valve peak gradient measures 35.3 mmHg. Aortic valve area, by VTI measures 1.17 cm.  FINDINGS  Left Ventricle: Left ventricular ejection fraction, by estimation, is 60 to 65%. The left ventricle has normal function. Left ventricular endocardial border not optimally defined to evaluate regional wall motion. There is moderate left ventricular hypertrophy. Right Ventricle: The right ventricular size is normal. Right vetricular wall thickness was not well visualized. Right ventricular systolic function is normal. Pericardium: There is no evidence of pericardial effusion. Mitral Valve: There is mild thickening of the mitral valve leaflet(s). There is mild calcification of the mitral valve leaflet(s). Mild mitral annular calcification. Aortic Valve: The aortic valve has an indeterminant number of cusps. There is mild calcification of the aortic valve. There is mild thickening of the aortic valve. There is mild aortic valve annular calcification. Aortic valve regurgitation is not visualized. Moderate aortic stenosis is present. Aortic valve mean gradient measures 20.7 mmHg. Aortic valve peak gradient measures 35.3 mmHg. Aortic valve area, by VTI measures 1.17 cm. Aorta: The aortic root is normal in size and structure. LEFT VENTRICLE PLAX 2D LVIDd:         5.00 cm LVIDs:         3.20 cm LV PW:         1.40 cm LV IVS:        1.40 cm LVOT diam:     2.10 cm LV SV:         82 LV SV Index:   37 LVOT Area:     3.46 cm  RIGHT VENTRICLE TAPSE (M-mode): 1.6 cm LEFT ATRIUM         Index LA diam:    5.40 cm  2.43 cm/m  AORTIC VALVE AV Area (Vmax):    1.09 cm AV Area (Vmean):   1.20 cm AV Area (VTI):     1.17 cm AV Vmax:           297.00 cm/s AV Vmean:          216.333 cm/s AV VTI:            0.699 m AV Peak Grad:      35.3 mmHg AV Mean Grad:      20.7 mmHg LVOT Vmax:         93.10 cm/s LVOT Vmean:        75.200 cm/s LVOT VTI:          0.236 m LVOT/AV VTI ratio: 0.34  AORTA Ao Root diam: 3.40 cm  SHUNTS Systemic VTI:  0.24 m Systemic Diam: 2.10 cm Dina Rich MD Electronically signed by Dina Rich MD Signature Date/Time: 02/16/2023/11:54:15 AM    Final    DG Chest 1 View  Result Date: 02/16/2023 CLINICAL DATA:  Shortness of breath EXAM: PORTABLE CHEST 1 VIEW COMPARISON:  02/15/2023 FINDINGS: Cardiac shadow  is mildly enlarged. Postsurgical changes are again seen. Patient is rotated to the left accentuating the mediastinal markings. The lungs are clear. No bony abnormality is noted. Mild residual central vascular prominence is noted. IMPRESSION: Stable central vascular prominence.  No acute abnormality noted. Electronically Signed   By: Alcide Clever M.D.   On: 02/16/2023 01:03    Scheduled Meds:  amLODipine  10 mg Oral Daily   aspirin EC  81 mg Oral Q breakfast   atorvastatin  40 mg Oral QHS   Chlorhexidine Gluconate Cloth  6 each Topical Daily   Chlorhexidine Gluconate Cloth  6 each Topical Q0600   darbepoetin (ARANESP) injection - NON-DIALYSIS  150 mcg Subcutaneous Q Mon-1800   doxycycline  100 mg Oral Q12H   famotidine  20 mg Oral Daily   feeding supplement  237 mL Oral BID BM   heparin  5,000 Units Subcutaneous Q8H   insulin aspart  0-6 Units Subcutaneous TID WC   levothyroxine  75 mcg Oral Q0600   metoprolol tartrate  50 mg Oral BID   metroNIDAZOLE  500 mg Oral Q12H   mupirocin ointment  1 Application Nasal BID   sodium bicarbonate  1,300 mg Oral BID   tamsulosin  0.8 mg Oral QPM   Continuous Infusions:  cefTRIAXone (ROCEPHIN)  IV 1 g (02/17/23 1455)   ferric gluconate (FERRLECIT)  IVPB 250 mg (02/17/23 1014)    LOS: 3 days   Shon Hale M.D on 02/17/2023 at 4:43 PM  Go to [email or web address] - for contact info  [facility] - Office  [phone]  If 7PM-7AM, please contact night-coverage [email or web address] 02/17/2023, 4:43 PM

## 2023-02-17 NOTE — Progress Notes (Signed)
Pt request home medications Effexor, gabapentin, latuda, Remeron, and multi vitamin

## 2023-02-17 NOTE — Progress Notes (Signed)
Subjective:  at least 3200 of UOP-  most labs in correct direction but kidney function labs slow to improve but not really worse  Objective Vital signs in last 24 hours: Vitals:   02/17/23 0600 02/17/23 0700 02/17/23 0721 02/17/23 0737  BP: 125/63 (!) 151/53    Pulse: 67 67  69  Resp: (!) Temp:   97.7 F (36.5 C)   TempSrc:   Oral   SpO2: 99% 97%  99%  Weight:      Height:       Weight change:   Intake/Output Summary (Last 24 hours) at 02/17/2023 0806 Last data filed at 02/17/2023 0600 Gross per 24 hour  Intake 1040 ml  Output 3250 ml  Net -2210 ml    Assessment/Plan: 62 yo male with HTN, DM, COPD, CAD, rectal cancer, PAD and OSA as well as CKD ( baseline crt high1's-  1.66 on 12/18/22 and 2.0 on 2/9 24)  Now with A on CRF Renal failure with hyperkalemia - u/s= thickened bladder wall consistent with chronic obstructive uropathy even though no hydronephrosis. Patient likely in ATN secondary to volume depletion and contrast nephropathy;s/p 80mL of Ominipaque on 02/10/2023    - s/p foley for more strict I&O's as well as high residual on bladder scan. Seems like adequate urine at this time. Hopefully he will start recovering renal function before needing dialysis. He is willing to have iHD if that became necessary and he understands that may still be possible.  No absolute indications today, potassium and bicarb are improved and making much urine-  will cont to watch- have  put on oral bicarb -  also stop lokelma    -Maintain MAP>65 for optimal renal perfusion.  - Continue strict Input and Output monitoring. Will monitor the patient closely with you and intervene or adjust therapy as indicated by changes in clinical status/labs   Hyponatremia secondary to renal failure + long standing h/o diarrhea. Getting isotonic fluid resuscitation- is slightly improved. 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  also added ESA   CAP  with findings suggestive of such on CT-  on rocephin  DM - per primary BPH on Flomax refusing a foley initially but now in place-  tricky part will be when to attempt removal-  not until crt really starts trending down    [employer]    Labs: Basic Metabolic Panel: Recent Labs  Lab 02/15/23 0329 02/16/23 0328 02/17/23 0515  NA 134* 132* 133*  K 5.9* 5.2* 4.2  CL 99 95* 95*  CO2 12* 20* 26  GLUCOSE 150* 92 99  BUN 77* 84* 82*  CREATININE 6.24* 7.18* 7.33*  CALCIUM 8.7* 8.3* 8.4*  PHOS 7.3* 6.6* 7.2*   Liver Function Tests: Recent Labs  Lab 02/14/23 1755 02/15/23 0329 02/16/23 0328 02/17/23 0515  AST 19 21  --   --   ALT 11 11  --   --   ALKPHOS 126 108  --   --   BILITOT 0.7 0.7  --   --   PROT 7.6 6.9  --   --   ALBUMIN 3.3* 3.1* 3.1* 2.9*   Recent Labs  Lab 02/14/23 1755  LIPASE 37   No results for input(s): "AMMONIA" in the last 168 hours. CBC: Recent Labs  Lab 02/14/23 1640 02/16/23 0530 02/17/23 0515  WBC 11.0* 8.9 7.2  NEUTROABS 9.2*  --   --  HGB 8.6* 8.2* 8.1*  HCT 27.1* 25.3* 25.2*  MCV 90.6 88.5 89.7  PLT 319 272 272   Cardiac Enzymes: No results for input(s): "CKTOTAL", "CKMB", "CKMBINDEX", "TROPONINI" in the last 168 hours. CBG: Recent Labs  Lab 02/16/23 1245 02/16/23 1548 02/16/23 2038 02/16/23 2148 02/17/23 0719  GLUCAP 120* 110* 165* 156* 118*    Iron Studies:  Recent Labs    02/16/23 0525  IRON 16*  TIBC 218*  FERRITIN 282   Studies/Results: ECHOCARDIOGRAM LIMITED  Result Date: 02/16/2023    ECHOCARDIOGRAM LIMITED REPORT   Patient Name:   ULYSSES TANSIL Date of Exam: 02/16/2023 Medical Rec #:  [id number]       Height:       69.0 in Accession #:    [id number]      Weight:       237.0 lb Date of Birth:  1961-06-03       BSA:          2.221 m Patient Age:    62 years        BP:           155/69 mmHg Patient Gender: M               HR:           80 bpm. [facility] Procedure: Limited Echo, Cardiac  Doppler and Limited Color Doppler Indications:    CHF-Acute Systolic I50.21  History:        Patient has prior history of Echocardiogram examinations, most                 recent 10/13/2022. Cardiomyopathy and CHF, CAD, Prior CABG; Risk                 Factors:Hypertension, Diabetes and Dyslipidemia.  Sonographer:    Celesta Gentile RCS Referring Phys: [id number] OLADAPO ADEFESO IMPRESSIONS  1. Left ventricular ejection fraction, by estimation, is 60 to 65%. The left ventricle has normal function. Left ventricular endocardial border not optimally defined to evaluate regional wall motion. There is moderate left ventricular hypertrophy.  2. Right ventricular systolic function is normal. The right ventricular size is normal.  3. The aortic valve has an indeterminant number of cusps. There is mild calcification of the aortic valve. There is mild thickening of the aortic valve. Aortic valve regurgitation is not visualized. Moderate aortic valve stenosis. Aortic valve mean gradient measures 20.7 mmHg. Aortic valve peak gradient measures 35.3 mmHg. Aortic valve area, by VTI measures 1.17 cm.  FINDINGS  Left Ventricle: Left ventricular ejection fraction, by estimation, is 60 to 65%. The left ventricle has normal function. Left ventricular endocardial border not optimally defined to evaluate regional wall motion. There is moderate left ventricular hypertrophy. Right Ventricle: The right ventricular size is normal. Right vetricular wall thickness was not well visualized. Right ventricular systolic function is normal. Pericardium: There is no evidence of pericardial effusion. Mitral Valve: There is mild thickening of the mitral valve leaflet(s). There is mild calcification of the mitral valve leaflet(s). Mild mitral annular calcification. Aortic Valve: The aortic valve has an indeterminant number of cusps. There is mild calcification of the aortic valve. There is mild thickening of the aortic valve. There is mild aortic valve  annular calcification. Aortic valve regurgitation is not visualized. Moderate aortic stenosis is present. Aortic valve mean gradient measures 20.7 mmHg. Aortic valve peak gradient measures 35.3 mmHg. Aortic valve area, by VTI measures 1.17 cm. Aorta: The aortic root  is normal in size and structure. LEFT VENTRICLE PLAX 2D LVIDd:         5.00 cm LVIDs:         3.20 cm LV PW:         1.40 cm LV IVS:        1.40 cm LVOT diam:     2.10 cm LV SV:         82 LV SV Index:   37 LVOT Area:     3.46 cm  RIGHT VENTRICLE TAPSE (M-mode): 1.6 cm LEFT ATRIUM         Index LA diam:    5.40 cm 2.43 cm/m  AORTIC VALVE AV Area (Vmax):    1.09 cm AV Area (Vmean):   1.20 cm AV Area (VTI):     1.17 cm AV Vmax:           297.00 cm/s AV Vmean:          216.333 cm/s AV VTI:            0.699 m AV Peak Grad:      35.3 mmHg AV Mean Grad:      20.7 mmHg LVOT Vmax:         93.10 cm/s LVOT Vmean:        75.200 cm/s LVOT VTI:          0.236 m LVOT/AV VTI ratio: 0.34  AORTA Ao Root diam: 3.40 cm  SHUNTS Systemic VTI:  0.24 m Systemic Diam: 2.10 cm Dina Rich MD Electronically signed by Dina Rich MD Signature Date/Time: 02/16/2023/11:54:15 AM    Final    DG Chest 1 View  Result Date: 02/16/2023 CLINICAL DATA:  Shortness of breath EXAM: PORTABLE CHEST 1 VIEW COMPARISON:  02/15/2023 FINDINGS: Cardiac shadow is mildly enlarged. Postsurgical changes are again seen. Patient is rotated to the left accentuating the mediastinal markings. The lungs are clear. No bony abnormality is noted. Mild residual central vascular prominence is noted. IMPRESSION: Stable central vascular prominence.  No acute abnormality noted. Electronically Signed   By: Alcide Clever M.D.   On: 02/16/2023 01:03   US RENAL  Result Date: 02/15/2023 CLINICAL DATA:  [id number] AKI (acute kidney injury) [id number] EXAM: RENAL / URINARY TRACT ULTRASOUND COMPLETE COMPARISON:  October 05, 2022, February 14, 2023 FINDINGS: Right Kidney: Renal measurements: 14.5 x 7.6 x 7.7 cm = volume:  442 mL. Echogenicity within normal limits. No mass or hydronephrosis visualized. Left Kidney: Renal measurements: 12.8 x 6 x 7 x 7.0 cm = volume: 315 mL. Echogenicity within normal limits. No mass or hydronephrosis visualized. Bladder: Mildly prominent bladder walls for degree of distension. Other: Trace perinephric fluid adjacent to the inferior pole the LEFT kidney. IMPRESSION: 1. No hydronephrosis. 2. Trace perinephric fluid adjacent to the inferior pole the LEFT kidney, as seen on recent CT. 3. Mildly prominent bladder walls for degree of distension, likely reflecting sequela of chronic outlet obstruction. Electronically Signed   By: Meda Klinefelter M.D.   On: 02/15/2023 11:00   Medications: Infusions:  cefTRIAXone (ROCEPHIN)  IV 1 g (02/16/23 1434)    Scheduled Medications:  aspirin EC  81 mg Oral Q breakfast   atorvastatin  40 mg Oral QHS   Chlorhexidine Gluconate Cloth  6 each Topical Daily   Chlorhexidine Gluconate Cloth  6 each Topical Q0600   darbepoetin (ARANESP) injection - NON-DIALYSIS  150 mcg Subcutaneous Q Mon-1800   doxycycline  100 mg Oral Q12H   famotidine  20  mg Oral Daily   feeding supplement  237 mL Oral BID BM   ferrous sulfate  325 mg Oral Q breakfast   heparin  5,000 Units Subcutaneous Q8H   insulin aspart  0-6 Units Subcutaneous TID WC   levothyroxine  75 mcg Oral Q0600   metoprolol tartrate  50 mg Oral BID   metroNIDAZOLE  500 mg Oral Q12H   mupirocin ointment  1 Application Nasal BID   sodium bicarbonate  1,300 mg Oral BID   sodium zirconium cyclosilicate  10 g Oral BID   tamsulosin  0.8 mg Oral QPM    have reviewed scheduled and prn medications.  Physical Exam: General: obese- NAD-  seems more calm today  Heart: RRR Lungs: CBS bilat  Abdomen: soft, non tender Extremities: some peripheral edema     02/17/2023,8:06 AM  LOS: 3 days

## 2023-02-18 ENCOUNTER — Encounter: Payer: Medicare Other | Admitting: Family

## 2023-02-18 DIAGNOSIS — J449 Chronic obstructive pulmonary disease, unspecified: Secondary | ICD-10-CM

## 2023-02-18 DIAGNOSIS — E8729 Other acidosis: Secondary | ICD-10-CM | POA: Diagnosis not present

## 2023-02-18 DIAGNOSIS — I10 Essential (primary) hypertension: Secondary | ICD-10-CM | POA: Diagnosis not present

## 2023-02-18 DIAGNOSIS — J90 Pleural effusion, not elsewhere classified: Secondary | ICD-10-CM | POA: Diagnosis not present

## 2023-02-18 LAB — RENAL FUNCTION PANEL
Albumin: 3 g/dL — ABNORMAL LOW (ref 3.5–5.0)
Anion gap: 12 (ref 5–15)
BUN: 78 mg/dL — ABNORMAL HIGH (ref 6–20)
CO2: 25 mmol/L (ref 22–32)
Calcium: 8.4 mg/dL — ABNORMAL LOW (ref 8.9–10.3)
Chloride: 96 mmol/L — ABNORMAL LOW (ref 98–111)
Creatinine, Ser: 6.39 mg/dL — ABNORMAL HIGH (ref 0.61–1.24)
GFR, Estimated: 9 mL/min — ABNORMAL LOW (ref >60)
Glucose, Bld: 88 mg/dL (ref 70–99)
Phosphorus: 5.8 mg/dL — ABNORMAL HIGH (ref 2.5–4.6)
Potassium: 3.8 mmol/L (ref 3.5–5.1)
Sodium: 133 mmol/L — ABNORMAL LOW (ref 135–145)

## 2023-02-18 LAB — GLUCOSE, CAPILLARY
Glucose-Capillary: 76 mg/dL (ref 70–99)
Glucose-Capillary: 116 mg/dL — ABNORMAL HIGH (ref 70–99)
Glucose-Capillary: 136 mg/dL — ABNORMAL HIGH (ref 70–99)
Glucose-Capillary: 140 mg/dL — ABNORMAL HIGH (ref 70–99)

## 2023-02-18 MED ORDER — SODIUM CHLORIDE 0.9 % IV SOLN: INTRAVENOUS | Status: DC | PRN

## 2023-02-18 MED ORDER — ORAL CARE MOUTH RINSE: 15.0000 mL | OROMUCOSAL | Status: DC | PRN

## 2023-02-18 NOTE — Progress Notes (Signed)
PROGRESS NOTE  Shane Chambers, is a 62 y.o. male, DOB - 27-Jul-1961, WUJ:[id number]  Admit date - 02/14/2023   Admitting Physician Frankey Shown, DO  Outpatient Primary MD for the patient is [name], MD  LOS - 4  Chief Complaint  Patient presents with   Nausea   Diarrhea      Brief Narrative:  -62 y.o. male with medical history significant of hypertension, hyperlipidemia, T2DM, GERD, COPD, CAD, hypothyroidism  admitted on 02/14/23 with AKI on CKD 3A with HyperKalemia and anion-gap metabolic acidosis. --Nephrology team would like to hold off on hemodialysis at this time to allow time for possible spontaneous renal recovery -Patient verbalizes understanding that if he does not have significant spontaneous renal recovery he may need hemodialysis this admission   -Assessment and Plan: 1)Aki on CKD 3A-with hyperkalemia and anion gap metabolic acidosis -Suspect dyspnea secondary to contrast induced ATN (patient received contrast on 02/10/2023)-compounded by bladder outlet obstruction/chronic obstructive uropathy -Renal ultrasound without hydronephrosis or definite acute obstruction, but there is evidence of chronic obstructive uropathy as demonstrated by thickened bladder wall -Potassium was 6.9 on admission, potassium normalized with Lokelma -Creatinine on 12/18/2022 was 1.66..,  Creatinine was 1.3 on 11/28/2022 -Creatinine continues trending down.  Currently 6.3 -Anion gap was greater than 20 on admission, anion gap has now normalized -Patient was initially anuric on admission----- Foley placed on 02/15/2023....  -Patient now having really good urine output -Stopped Torsemide -Stop Metformin --Bicarb was 11 on 02/14/2023----improving with bicarb drip--Bicarb on 02/17/2023 is up to 26 -Creatinine was 6.0 on admission (02/14/23) creatinine currently trending down and in the 6.3 range again. -Okay to discontinue bicarb drip and give oral bicarb supplementation --renally adjust medications, avoid  nephrotoxic agents / dehydration  / hypotension -Nephrology team would like to hold off on hemodialysis at this time to allow time for possible spontaneous renal recovery -Patient verbalizes understanding that if he does not have significant spontaneous renal recovery he may need hemodialysis this admission  Intake/Output Summary (Last 24 hours) at 02/18/2023 1922 Last data filed at 02/18/2023 1735 Gross per 24 hour  Intake 745.84 ml  Output 5370 ml  Net -4624.16 ml     2)HypoNatremia--- sodium on admission was 131, sodium is currently up to 133 -IV fluid bicarb drip has been discontinued  3)Hyperphosphatemia and 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  -Nephrology input appreciated  4)CAD--status post CABG in 03/2020---no chest pains or ACS type concerns Recent echo with preserved EF -EKG with sinus rhythm with nonspecific ST segment changes -Continue atorvastatin and metoprolol -Aspirin 81 mg daily  5)Presumed Rt Sided community-acquired pneumonia---   imaging studies noted -PCT 0.22 -WBC 11.0>.8.9>>7.2 -Continue Rocephin and doxycycline as ordered  6)Mild Proctocolitis----h/o rectal Ca -WBC and PCT as above #5 -Continue Rocephin and Flagyl as ordered  7)PAD s/p rt BKA-- -continue atorvastatin -Aspirin 81 mg daily  8)Class 2 Obesity/OSA----CPAP nightly advised  9)HFpEF--- chronic diastolic dysfunction - Prior h/o HFpEF (20-25% which normalized post CABG),  -CHF echocardiogram done on 10/13/2022 showed LVEF of 55 to 60%.  No RWMA  -Repeat echo from 02/16/2023 with EF of 60 to 65% with moderate LVH, -Patient required IV fluids due to AKI on CKD- -continue to monitor closely as patient is at risk for volume overload/CHF exacerbation  10) chronic diarrhea--Imodium as needed  11) BPH with LUTs--renal ultrasound shows evidence of chronic obstructive uropathy as demonstrated by thickened bladder wall -Foley placed on 02/15/2023 -Flomax as ordered  12)DM2- A1C 6.1-reflecting  excellent diabetic control  PTA -Hold metformin due to kidney concerns -Use Novolog/Humalog Sliding scale insulin with Accu-Cheks/Fingersticks as ordered   13) hypothyroidism--stable,  continue levothyroxine 75 mcg daily  14)GERD--hold PPI, use Pepcid as ordered  15)COPD-no acute exacerbation at this time, continue bronchodilators, avoid steroids at this time  16)HTN--continue metoprolol 50 twice daily Restart amlodipine  17) chronic anemia in the setting of CKD -Continue iron supplementation -Patient received IV iron on 02/17/2023  Status is: Inpatient   Disposition: The patient is from: SNF [place]              Anticipated d/c is to: SNF              Anticipated d/c date is: 3 days              Patient currently is not medically stable to d/c. Barriers: Not Clinically Stable-   Code Status :  -  Code Status: Full Code   Family Communication:    NA (patient is alert, awake and coherent)   DVT Prophylaxis  :   - SCDs   heparin injection 5,000 Units Start: 02/15/23 0600 SCDs Start: 02/15/23 0154   Lab Results  Component Value Date   PLT 272 02/17/2023   Inpatient Medications  Scheduled Meds:  amLODipine  10 mg Oral Daily   aspirin EC  81 mg Oral Q breakfast   atorvastatin  40 mg Oral QHS   Chlorhexidine Gluconate Cloth  6 each Topical Daily   Chlorhexidine Gluconate Cloth  6 each Topical Q0600   darbepoetin (ARANESP) injection - NON-DIALYSIS  150 mcg Subcutaneous Q Mon-1800   doxycycline  100 mg Oral Q12H   famotidine  20 mg Oral Daily   feeding supplement  237 mL Oral BID BM   heparin  5,000 Units Subcutaneous Q8H   insulin aspart  0-6 Units Subcutaneous TID WC   levothyroxine  75 mcg Oral Q0600   metoprolol tartrate  50 mg Oral BID   metroNIDAZOLE  500 mg Oral Q12H   mupirocin ointment  1 Application Nasal BID   sodium bicarbonate  1,300 mg Oral BID   tamsulosin  0.8 mg Oral QPM   Continuous Infusions:  sodium chloride 10 mL/hr at 02/18/23 1454    cefTRIAXone (ROCEPHIN)  IV 1 g (02/18/23 1455)   ferric gluconate (FERRLECIT) IVPB 250 mg (02/18/23 1615)   PRN Meds:.sodium chloride, acetaminophen **OR** acetaminophen, ipratropium-albuterol, loperamide, mouth rinse, prochlorperazine   Anti-infectives (From admission, onward)    Start     Dose/Rate Route Frequency Ordered Stop   02/15/23 1430  cefTRIAXone (ROCEPHIN) 1 g in sodium chloride 0.9 % 100 mL IVPB        1 g 200 mL/hr over 30 Minutes Intravenous Every 24 hours 02/15/23 1341     02/15/23 1430  doxycycline (VIBRA-TABS) tablet 100 mg        100 mg Oral Every 12 hours 02/15/23 1341     02/15/23 1430  metroNIDAZOLE (FLAGYL) tablet 500 mg        500 mg Oral Every 12 hours 02/15/23 1341        Subjective: [name] no fever, no chest pain, no nausea, no vomiting.  Reports feeling tired and with increased urine output.  No overnight events.  Objective: Vitals:   02/17/23 2143 02/18/23 0500 02/18/23 0517 02/18/23 1508  BP: (!) 149/69  (!) 149/66 (!) 118/35  Pulse: 74  79 70  Resp: 20  20 18   Temp: 98.4 F (36.9 C)  99.4  F (37.4 C) 98.5 F (36.9 C)  TempSrc: Oral  Oral Oral  SpO2: 95%  96% 97%  Weight:  106.1 kg    Height:        Intake/Output Summary (Last 24 hours) at 02/18/2023 1922 Last data filed at 02/18/2023 1735 Gross per 24 hour  Intake 745.84 ml  Output 5370 ml  Net -4624.16 ml   Filed Weights   02/15/23 0200 02/17/23 0400 02/18/23 0500  Weight: 107.5 kg 107.7 kg 106.1 kg   Physical Exam General exam: Alert, awake, oriented x 3; in no acute distress and reporting no overnight events.  Report good and increased urine output. Respiratory system: Good room bilaterally; no using accessory muscles.  Good saturation appreciated on exam. Cardiovascular system:RRR. No rubs or gallops; no JVD. Gastrointestinal system: Abdomen is nondistended, soft and nontender. No organomegaly or masses felt. Normal bowel sounds heard. Central nervous system: Alert and  oriented. No focal neurological deficits. Extremities: No cyanosis clubbing; right BKA. Skin: No petechiae. Psychiatry: Judgement and insight appear normal. Mood & affect appropriate.    Data Reviewed: I have personally reviewed following labs and imaging studies  CBC: Recent Labs  Lab 02/14/23 1640 02/16/23 0530 02/17/23 0515  WBC 11.0* 8.9 7.2  NEUTROABS 9.2*  --   --   HGB 8.6* 8.2* 8.1*  HCT 27.1* 25.3* 25.2*  MCV 90.6 88.5 89.7  PLT 319 272 272   Basic Metabolic Panel: Recent Labs  Lab 02/14/23 2207 02/15/23 0329 02/16/23 0328 02/17/23 0515 02/18/23 0405  NA 132* 134* 132* 133* 133*  K 6.3* 5.9* 5.2* 4.2 3.8  CL 98 99 95* 95* 96*  CO2 11* 12* 20* 26 25  GLUCOSE 107* 150* 92 99 88  BUN 80* 77* 84* 82* 78*  CREATININE 6.38* 6.24* 7.18* 7.33* 6.39*  CALCIUM 8.9 8.7* 8.3* 8.4* 8.4*  MG  --  1.7  --   --   --   PHOS  --  7.3* 6.6* 7.2* 5.8*   GFR: Estimated Creatinine Clearance: 14.8 mL/min (A) (by C-G formula based on SCr of 6.39 mg/dL (H)). Liver Function Tests: Recent Labs  Lab 02/14/23 1755 02/15/23 0329 02/16/23 0328 02/17/23 0515 02/18/23 0405  AST 19 21  --   --   --   ALT 11 11  --   --   --   ALKPHOS 126 108  --   --   --   BILITOT 0.7 0.7  --   --   --   PROT 7.6 6.9  --   --   --   ALBUMIN 3.3* 3.1* 3.1* 2.9* 3.0*   Radiology Studies: No results found.  Scheduled Meds:  amLODipine  10 mg Oral Daily   aspirin EC  81 mg Oral Q breakfast   atorvastatin  40 mg Oral QHS   Chlorhexidine Gluconate Cloth  6 each Topical Daily   Chlorhexidine Gluconate Cloth  6 each Topical Q0600   darbepoetin (ARANESP) injection - NON-DIALYSIS  150 mcg Subcutaneous Q Mon-1800   doxycycline  100 mg Oral Q12H   famotidine  20 mg Oral Daily   feeding supplement  237 mL Oral BID BM   heparin  5,000 Units Subcutaneous Q8H   insulin aspart  0-6 Units Subcutaneous TID WC   levothyroxine  75 mcg Oral Q0600   metoprolol tartrate  50 mg Oral BID   metroNIDAZOLE  500  mg Oral Q12H   mupirocin ointment  1 Application Nasal BID   sodium bicarbonate  1,300 mg Oral BID   tamsulosin  0.8 mg Oral QPM   Continuous Infusions:  sodium chloride 10 mL/hr at 02/18/23 1454   cefTRIAXone (ROCEPHIN)  IV 1 g (02/18/23 1455)   ferric gluconate (FERRLECIT) IVPB 250 mg (02/18/23 1615)    LOS: 4 days   Vassie Loll M.D on 02/18/2023 at 7:22 PM  Go to [email or web address] - for contact info  [facility] - Office  [phone]  If 7PM-7AM, please contact night-coverage [email or web address] 02/18/2023, 7:22 PM

## 2023-02-18 NOTE — Progress Notes (Signed)
Patient ID: Shane Chambers, male   DOB: 29-Sep-1961, 62 y.o.   MRN: [id number] S: Has had significant diuresis since placing foley catheter.  No events overnight. O:BP (!) 149/66 (BP Location: Left Arm)   Pulse 79   Temp 99.4 F (37.4 C) (Oral)   Resp 20   Ht 5\' 9"  (1.753 m)   Wt 106.1 kg   SpO2 96%   BMI 34.53 kg/m   Intake/Output Summary (Last 24 hours) at 02/18/2023 1208 Last data filed at 02/18/2023 1044 Gross per 24 hour  Intake 985.84 ml  Output 8170 ml  Net -7184.16 ml   Intake/Output: I/O last 3 completed shifts: In: 2265.8 [P.O.:1760; IV Piggyback:505.8] Out: 19758 [Urine:11600]  Intake/Output this shift:  Total I/O In: -  Out: 620 [Urine:620] Weight change: -1.649 kg Gen: NAD CVS: RRR Resp:CTA Abd: +BS, soft, NT/ND Ext: no edema, s/p RBKA  Recent Labs  Lab 02/14/23 1755 02/14/23 2207 02/15/23 0329 02/16/23 0328 02/17/23 0515 02/18/23 0405  NA 131* 132* 134* 132* 133* 133*  K 6.9* 6.3* 5.9* 5.2* 4.2 3.8  CL 96* 98 99 95* 95* 96*  CO2 15* 11* 12* 20* 26 25  GLUCOSE 69* 107* 150* 92 99 88  BUN 78* 80* 77* 84* 82* 78*  CREATININE 6.03* 6.38* 6.24* 7.18* 7.33* 6.39*  ALBUMIN 3.3*  --  3.1* 3.1* 2.9* 3.0*  CALCIUM 9.0 8.9 8.7* 8.3* 8.4* 8.4*  PHOS  --   --  7.3* 6.6* 7.2* 5.8*  AST 19  --  21  --   --   --   ALT 11  --  11  --   --   --    Liver Function Tests: Recent Labs  Lab 02/14/23 1755 02/15/23 0329 02/16/23 0328 02/17/23 0515 02/18/23 0405  AST 19 21  --   --   --   ALT 11 11  --   --   --   ALKPHOS 126 108  --   --   --   BILITOT 0.7 0.7  --   --   --   PROT 7.6 6.9  --   --   --   ALBUMIN 3.3* 3.1* 3.1* 2.9* 3.0*   Recent Labs  Lab 02/14/23 1755  LIPASE 37   No results for input(s): "AMMONIA" in the last 168 hours. CBC: Recent Labs  Lab 02/14/23 1640 02/16/23 0530 02/17/23 0515  WBC 11.0* 8.9 7.2  NEUTROABS 9.2*  --   --   HGB 8.6* 8.2* 8.1*  HCT 27.1* 25.3* 25.2*  MCV 90.6 88.5 89.7  PLT 319 272 272   Cardiac  Enzymes: No results for input(s): "CKTOTAL", "CKMB", "CKMBINDEX", "TROPONINI" in the last 168 hours. CBG: Recent Labs  Lab 02/17/23 1126 02/17/23 1621 02/17/23 2108 02/18/23 0745 02/18/23 1129  GLUCAP 103* 141* 164* 76 116*    Iron Studies:  Recent Labs    02/16/23 0525  IRON 16*  TIBC 218*  FERRITIN 282   Studies/Results: No results found.  amLODipine  10 mg Oral Daily   aspirin EC  81 mg Oral Q breakfast   atorvastatin  40 mg Oral QHS   Chlorhexidine Gluconate Cloth  6 each Topical Daily   Chlorhexidine Gluconate Cloth  6 each Topical Q0600   darbepoetin (ARANESP) injection - NON-DIALYSIS  150 mcg Subcutaneous Q Mon-1800   doxycycline  100 mg Oral Q12H   famotidine  20 mg Oral Daily   feeding supplement  237 mL Oral BID BM  heparin  5,000 Units Subcutaneous Q8H   insulin aspart  0-6 Units Subcutaneous TID WC   levothyroxine  75 mcg Oral Q0600   metoprolol tartrate  50 mg Oral BID   metroNIDAZOLE  500 mg Oral Q12H   mupirocin ointment  1 Application Nasal BID   sodium bicarbonate  1,300 mg Oral BID   tamsulosin  0.8 mg Oral QPM    BMET    Component Value Date/Time   NA 133 (L) 02/18/2023 0405   K 3.8 02/18/2023 0405   CL 96 (L) 02/18/2023 0405   CO2 25 02/18/2023 0405   GLUCOSE 88 02/18/2023 0405   BUN 78 (H) 02/18/2023 0405   CREATININE 6.39 (H) 02/18/2023 0405   CALCIUM 8.4 (L) 02/18/2023 0405   GFRNONAA 9 (L) 02/18/2023 0405   GFRAA >60 08/07/2020 0255   CBC    Component Value Date/Time   WBC 7.2 02/17/2023 0515   RBC 2.81 (L) 02/17/2023 0515   HGB 8.1 (L) 02/17/2023 0515   HCT 25.2 (L) 02/17/2023 0515   PLT 272 02/17/2023 0515   MCV 89.7 02/17/2023 0515   MCH 28.8 02/17/2023 0515   MCHC 32.1 02/17/2023 0515   RDW 12.8 02/17/2023 0515   LYMPHSABS 0.9 02/14/2023 1640   MONOABS 0.7 02/14/2023 1640   EOSABS 0.1 02/14/2023 1640   BASOSABS 0.0 02/14/2023 1640    Assessment/Plan: 62 yo male with HTN, DM, COPD, CAD, rectal cancer, PAD and OSA as  well as CKD ( baseline crt high1's-  1.66 on 12/18/22 and 2.0 on 2/9 24)  Now with A on CRF AKI/CKD Stage IIIb with hyperkalemia - u/s= thickened bladder wall consistent with chronic obstructive uropathy even though no hydronephrosis. Patient likely with ATN secondary to volume depletion and contrast nephropathy;s/p 29mL of Ominipaque on 02/10/2023 s/p foley for more strict I&O's as well as high residual on bladder scan. Seems like adequate urine at this time. Hopefully he will start recovering renal function before needing dialysis.  He is willing to have iHD if that became necessary and he understands that may still be possible.  No absolute indications today, potassium and bicarb are improved and making much urine-  will cont to watch- have  put on oral bicarb -  also stop lokelma  Maintain MAP>65 for optimal renal perfusion.  Continue strict Input and Output monitoring. Will monitor the patient closely with you and intervene or adjust therapy as indicated by changes in clinical status/labs Hyponatremia secondary to renal failure + long standing h/o diarrhea. Getting isotonic fluid resuscitation- is slightly improved. 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  also added ESA  CAP with findings suggestive of such on CT-  on rocephin  DM - per primary BPH on Flomax refusing a foley initially but now in place-  tricky part will be when to attempt removal-  not until crt really starts trending down   Irena Cords, MD [facility]

## 2023-02-19 ENCOUNTER — Ambulatory Visit: Payer: Medicare Other | Admitting: Gastroenterology

## 2023-02-19 DIAGNOSIS — E872 Acidosis, unspecified: Secondary | ICD-10-CM | POA: Diagnosis not present

## 2023-02-19 DIAGNOSIS — J810 Acute pulmonary edema: Secondary | ICD-10-CM | POA: Diagnosis not present

## 2023-02-19 DIAGNOSIS — E8729 Other acidosis: Secondary | ICD-10-CM | POA: Diagnosis not present

## 2023-02-19 DIAGNOSIS — E875 Hyperkalemia: Secondary | ICD-10-CM | POA: Diagnosis not present

## 2023-02-19 DIAGNOSIS — I5032 Chronic diastolic (congestive) heart failure: Secondary | ICD-10-CM

## 2023-02-19 LAB — GLUCOSE, CAPILLARY
Glucose-Capillary: 74 mg/dL (ref 70–99)
Glucose-Capillary: 159 mg/dL — ABNORMAL HIGH (ref 70–99)
Glucose-Capillary: 148 mg/dL — ABNORMAL HIGH (ref 70–99)
Glucose-Capillary: 201 mg/dL — ABNORMAL HIGH (ref 70–99)

## 2023-02-19 LAB — RENAL FUNCTION PANEL
Albumin: 2.8 g/dL — ABNORMAL LOW (ref 3.5–5.0)
Anion gap: 10 (ref 5–15)
BUN: 69 mg/dL — ABNORMAL HIGH (ref 6–20)
CO2: 27 mmol/L (ref 22–32)
Calcium: 8.6 mg/dL — ABNORMAL LOW (ref 8.9–10.3)
Chloride: 101 mmol/L (ref 98–111)
Creatinine, Ser: 5.14 mg/dL — ABNORMAL HIGH (ref 0.61–1.24)
GFR, Estimated: 12 mL/min — ABNORMAL LOW (ref >60)
Glucose, Bld: 84 mg/dL (ref 70–99)
Phosphorus: 5.1 mg/dL — ABNORMAL HIGH (ref 2.5–4.6)
Potassium: 3.2 mmol/L — ABNORMAL LOW (ref 3.5–5.1)
Sodium: 138 mmol/L (ref 135–145)

## 2023-02-19 MED ORDER — SODIUM BICARBONATE 650 MG PO TABS: 650.0000 mg | ORAL_TABLET | Freq: Two times a day (BID) | ORAL | 0 refills | Status: AC

## 2023-02-19 MED ORDER — CEFDINIR 300 MG PO CAPS: 300.0000 mg | ORAL_CAPSULE | Freq: Every day | ORAL | 0 refills | Status: AC

## 2023-02-19 MED ORDER — GABAPENTIN 300 MG PO CAPS: 300.0000 mg | ORAL_CAPSULE | Freq: Every day | ORAL | Status: DC

## 2023-02-19 MED ORDER — DOXYCYCLINE HYCLATE 100 MG PO TABS: 100.0000 mg | ORAL_TABLET | Freq: Two times a day (BID) | ORAL | 0 refills | Status: AC

## 2023-02-19 NOTE — Care Management Important Message (Signed)
Important Message  Patient Details  Name: Shane Chambers MRN: [id number] Date of Birth: 08-04-1961   Medicare Important Message Given:  Yes     Corey Harold 02/19/2023, 10:50 AM

## 2023-02-19 NOTE — Progress Notes (Signed)
Discharge report called to [facility]; report given to Domenica Reamer, RN.

## 2023-02-19 NOTE — Discharge Summary (Signed)
Physician Discharge Summary   Patient: Shane Chambers MRN: [id number] DOB: Feb 23, 1961  Admit date:     02/14/2023  Discharge date: 02/19/23  Discharge Physician: Vassie Lollarlos Latrecia Capito   PCP: Charlynne PanderBlass, Joel, MD   Recommendations at discharge:  Repeat renal function panel to follow electrolytes and renal function Continue to reassess patient's blood pressure/volume status with further adjustment to antihypertensive regimen and diuretics if needed. Make sure patient has follow-up with urology service as instructed for voiding trials and definitive treatment regarding Foley catheter. Outpatient follow-up with nephrology service Repeat CBC to follow hemoglobin trend/stability Continue close monitoring of patient's CBGs with further adjustment to hypoglycemia regimen as required. Continue to assist patient with weight loss management. Repeat chest x-ray in 6 weeks to assure resolution of infiltrates.  Discharge Diagnoses: Principal Problem:   High anion gap metabolic acidosis Active Problems:   Uncontrolled type 2 diabetes mellitus with hypoglycemia, with long-term current use of insulin   Essential hypertension   Mixed hyperlipidemia   BPH (benign prostatic hyperplasia)   Acute hyponatremia   COPD (chronic obstructive pulmonary disease)   GERD without esophagitis   AKI (acute kidney injury)   Acquired hypothyroidism   Hypoalbuminemia due to protein-calorie malnutrition   Hyperkalemia   Pulmonary edema   Bilateral pleural effusion   Nausea & vomiting   Chronic diarrhea   Obesity (BMI 30-39.9)   Hospital Course: As per H&P written by Dr. Thomes DinningAdefeso on 02/15/2023 Shane Chambers is a 62 y.o. male with medical history significant of hypertension, hyperlipidemia, T2DM, GERD, COPD, CAD, hypothyroidism who presents to the emergency department via EMS from [facility] [facility]) due to 1 to 2-day onset of worsening shortness of breath.  He complained of several months of chronic  diarrhea (which consists of intermittent loose/mushy/watery stools) and 3 episodes of vomiting today.  EMS was activated due to patient's complaints of shortness of breath.  He endorsed some chills, but denies fever, abdominal pain, chest pain.   ED Course:  In the emergency department, he was hemodynamically stable, BP was 114/50 and other vital signs were within normal range.  Workup in the ED showed WBC 11.0, H/H 8.6/27.1, MCV 90.6, platelets 319.  BMP showed sodium 131, potassium 6.9, chloride 96, bicarb 15, blood glucose 69, BUN 78, creatinine 6.03, albumin 3.3, EGFR 10.  Anion gap 20.  Troponin 11, lipase 37. CT abdomen and pelvis without contrast showed: 1. Possible mild proctocolitis. 2. Increased nonspecific perinephric stranding compared to 02/10/2023. This may be related to fluid status/heart failure given pulmonary, mesenteric and body wall edema. 3. Interstitial pulmonary edema with small right and trace left pleural effusions. 4. Consolidation in the right middle lobe may reflect atelectasis or pneumonia. Breathing treatment was provided, Ativan was given, Reglan was given insulin and dextrose 50% were given due to hyperkalemia, calcium gluconate was given to stabilize the heart.  IV hydration of 1 L NS was provided. Nephrologist was consulted due to acute kidney injury with EGFR of 10 (this was > 60 as of 11/28/2022) with some recommendations per ED PA.  Hospitalist was asked to admit patient for further evaluation and management.  Assessment and Plan: 1)Aki on CKD 3A-with hyperkalemia and anion gap metabolic acidosis -Suspect dyspnea secondary to contrast induced ATN (patient received contrast on 02/10/2023)-compounded by bladder outlet obstruction/chronic obstructive uropathy -Renal ultrasound without hydronephrosis or definite acute obstruction, but there is evidence of chronic obstructive uropathy as demonstrated by thickened bladder wall -Potassium was 6.9 on admission, potassium  normalized  with Lokelma -Creatinine on 12/18/2022 was 1.66..,  Creatinine was 1.3 on 11/28/2022 -Anion gap was greater than 20 on admission, anion gap has now normalized -Patient was initially anuric on admission----- Foley placed on 02/15/2023....  -Patient now having really good urine output -Stopped Torsemide -Stop Metformin --Bicarb was 11 on 02/14/2023----improving with bicarb drip--Bicarb on 02/17/2023 is up to 26 -Creatinine was 6.0 on admission (02/14/23) creatinine currently trending down and in the 5.3 range at time of discharge (creatinine demonstrating a steady drop for 3 days consecutively prior to discharge). -Okay to discontinue bicarb drip and give oral bicarb supplementation --renally adjust medications, avoid nephrotoxic agents / dehydration  / hypotension -Nephrology team would like to hold off on hemodialysis at this time to allow time for possible spontaneous renal recovery. -Patient verbalizes understanding that if he does not have significant spontaneous renal recovery he may need hemodialysis this admission. -Outpatient follow-up with nephrology and urology service recommended.   Intake/Output Summary (Last 24 hours) at 02/18/2023 1922 Last data filed at 02/18/2023 1735    Gross per 24 hour  Intake 745.84 ml  Output 5370 ml  Net -4624.16 ml      2)HypoNatremia--- sodium on admission was 131, sodium is currently up to 133 -IV fluid bicarb drip has been discontinued and electrolytes have remained stable.   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 -Continue Rocephin and doxycycline as ordered   6)Mild Proctocolitis----h/o rectal Ca -WBC and PCT as above #5 -Complete antibiotics therapy as prescribed -continue outpatient follow up with GI service.   7)PAD s/p rt BKA-- -continue atorvastatin -continue Aspirin 81 mg daily   8)Class 2 Obesity/OSA--- -continue CPAP nightly advised   9)HFpEF--- chronic diastolic dysfunction - Prior h/o HFpEF (20-25% which normalized post CABG),  -CHF echocardiogram done on 10/13/2022 showed LVEF of 55 to 60%.  No RWMA  -Repeat echo from 02/16/2023 with EF of 60 to 65% with moderate LVH, no wall motion abnormalities. -Patient required IV fluids due to AKI on CKD- -continue to monitor weight closely as patient is at risk for CHF exacerbation. -low sodium recommended -holding diuretic therapy at discharge.   10) chronic diarrhea/constipation- -continue Imodium and the use of miralax as needed   11) BPH with LUTs--renal ultrasound shows evidence of chronic obstructive uropathy as demonstrated by thickened bladder wall -Foley placed on 02/15/2023 -Flomax as ordered -Urology service was curbside and recommendations made for discharge patient with Foley catheter in place and outpatient follow-up for voiding trial, cystoscopy if needed.   12)DM2- A1C 6.1-reflecting excellent diabetic control PTA -Continue holding metformin due to kidney concerns -Follow CBGs and resume prior to admission insulin therapy.   13) hypothyroidism--stable -continue levothyroxine 75 mcg daily   14)GERD-- -continue treatment with Pepcid and resume PPI.   15)COPD- -no acute exacerbation throughout hospitalization -Continue home bronchodilator management and as needed mucolytic's. -Good saturation on room air.   16)HTN-- -continue metoprolol and amlodipine -Currently diet discussed with patient -Reassess  blood pressure and further adjust antihypertensive treatment as needed.   17) chronic anemia in  the setting of CKD -Continue iron supplementation and ESA -No signs of overt bleeding appreciated -Repeat CBC to follow hemoglobin trend/stability.    Consultants: Nephrology service; urology curbside. Procedures performed: See below for x-ray report. Disposition: Discharge back to skilled nursing facility (long-term resident). Diet recommendation: Heart healthy/modified carbohydrate diet.  DISCHARGE MEDICATION: Allergies as of 02/19/2023       Reactions   Ace Inhibitors Swelling, Cough   (Not on MAR at [facility])   Haloperidol And Related    Do NOT give anti-psychotics due to risk of  Torsades and QT prolongation (per Dr. Teena Irani request)   Other Itching   [name]   Shellfish Allergy    Listed on MAR   Penicillins Itching, Rash   Has patient had a PCN reaction causing immediate rash, facial/tongue/throat swelling, SOB or lightheadedness with hypotension: Unknown Has patient had a PCN reaction causing severe rash involving mucus membranes or skin necrosis: Unknown Has patient had a PCN reaction that required hospitalization: Unknown Has patient had a PCN reaction occurring within the last 10 years: Unknown If all of the above answers are "NO", then may proceed with Cephalosporin use. Tolerated ancef (12-17-22)        Medication List     STOP taking these medications    alum & mag hydroxide-simeth 200-200-20 MG/5ML suspension Commonly known as: MAALOX/MYLANTA   barrier cream Crea Commonly known as: non-specified   clotrimazole-betamethasone cream Commonly known as: LOTRISONE   Latuda 20 MG Tabs tablet Generic drug: lurasidone   magnesium oxide 400 MG tablet Commonly known as: MAG-OX   metFORMIN 1000 MG tablet Commonly known as: GLUCOPHAGE   oxyCODONE-acetaminophen 5-325 MG tablet Commonly known as: PERCOCET/ROXICET    potassium chloride SA 20 MEQ tablet Commonly known as: KLOR-CON M   senna-docusate 8.6-50 MG tablet Commonly known as: Senokot-S   torsemide 20 MG tablet Commonly known as: DEMADEX       TAKE these medications    acetaminophen 500 MG tablet Commonly known as: TYLENOL Take 1,000 mg by mouth every 8 (eight) hours as needed for fever or moderate pain.   albuterol 108 (90 Base) MCG/ACT inhaler Commonly known as: VENTOLIN HFA Inhale 2 puffs into the lungs every 6 (six) hours as needed for wheezing or shortness of breath.   amLODipine 10 MG tablet Commonly known as: NORVASC Take 1 tablet (10 mg total) by mouth daily.   ascorbic acid 500 MG tablet Commonly known as: VITAMIN C Take 1 tablet (500 mg total) by mouth 2 (two) times daily.   aspirin EC 81 MG tablet Take 1 tablet (81 mg total) by mouth daily with breakfast.   atorvastatin 40 MG tablet Commonly known as: LIPITOR Take 1 tablet (40 mg total) by mouth at bedtime.   budesonide-formoterol 160-4.5 MCG/ACT inhaler Commonly known as: Symbicort Inhale 2 puffs into the lungs in the morning and at bedtime.   cefdinir 300 MG capsule Commonly known as: OMNICEF Take 1 capsule (300 mg total) by mouth daily for 3 days.   Cholecalciferol 125 MCG (5000 UT) Tabs Take 1 tablet by mouth every Monday.   doxycycline 100 MG tablet Commonly known as: VIBRA-TABS Take 1 tablet (100 mg total) by mouth every 12 (twelve) hours for 3 days.   ferrous gluconate 324 MG tablet Commonly known as: FERGON Take 324 mg by mouth daily.   gabapentin 300 MG capsule Commonly known as: NEURONTIN Take 1 capsule (300 mg total) by mouth at bedtime. What changed:  how much to take when to take this   Glucerna Liqd Take 1 Can by mouth with breakfast, with lunch, and with evening meal.   ipratropium-albuterol 0.5-2.5 (3) MG/3ML Soln Commonly known as: DUONEB Take 3 mLs by nebulization every 6 (six) hours as needed. What changed: reasons to take  this   levothyroxine 75 MCG tablet Commonly known as: SYNTHROID Take 75 mcg by mouth daily before breakfast.   metoprolol tartrate 50 MG tablet Commonly known as: LOPRESSOR Take 1 tablet (50 mg total) by mouth 2 (two) times daily.   mirtazapine 30 MG tablet Commonly known as: REMERON Take 30 mg by mouth at bedtime.   multivitamin tablet Take 1 tablet by mouth daily.   NovoLOG FlexPen 100 UNIT/ML FlexPen Generic drug: insulin aspart 0-15 Units, Subcutaneous, 3 times daily with meals CBG < 70: implement hypoglycemia protocol-call MD CBG 70 - 120: 0 units CBG 121 - 150: 2 units CBG 151 - 200: 3 units CBG 201 - 250: 5 units CBG 251 - 300: 8 units CBG 301 - 350: 11 units CBG 351 - 400: 15 units CBG > 400:   omeprazole 40 MG capsule Commonly known as: PRILOSEC Take 40 mg by mouth daily.   polyethylene glycol 17 g packet Commonly known as: MIRALAX / GLYCOLAX Take 17 g by mouth daily as needed for moderate constipation.   polyvinyl alcohol 1.4 % ophthalmic solution Commonly known as: LIQUIFILM TEARS Place 1 drop into both eyes daily.   sodium bicarbonate 650 MG tablet Take 1 tablet (650 mg total) by mouth 2 (two) times daily.   tamsulosin 0.4 MG Caps capsule Commonly known as: FLOMAX Take 0.8 mg by mouth every evening.   thiamine 100 MG tablet Commonly known as: Vitamin B-1 Take 1 tablet (100 mg total) by mouth daily.   venlafaxine XR 150 MG 24 hr capsule Commonly known as: EFFEXOR-XR Take 1 capsule (150 mg total) by mouth daily with breakfast.   Vitamin D (Ergocalciferol) 1.25 MG (50000 UNIT) Caps capsule Commonly known as: DRISDOL Take 1 capsule (50,000 Units total) by mouth every 7 (seven) days.        Follow-up Information     [name], MD. Schedule an appointment as soon as possible for a visit in 1 week(s).   Specialty: Internal Medicine Contact information: [address] [place] [address] [phone]         Malen Gauze, MD.  Schedule an appointment as soon as possible for a visit in 10 day(s).   Specialty: Urology Why: urinary retention and foley catheter removal Contact information: [address] [place] [address] [phone]         Terrial Rhodes, MD. Schedule an appointment as soon as possible for a visit in 3 week(s).   Specialty: Nephrology Contact information: [address] [place] [address] [phone]                Discharge Exam: Filed Weights   02/17/23 0400 02/18/23 0500 02/19/23 0546  Weight: 107.7 kg 106.1 kg 102.3 kg   General exam: Alert, awake, oriented x 3; in no acute distress and reporting no overnight events.  Report good and increased urine output. Respiratory system: Good room bilaterally; no using accessory muscles.  Good saturation appreciated on exam. Cardiovascular system:RRR. No rubs or gallops; no JVD. Gastrointestinal system: Abdomen is nondistended, soft and nontender. No organomegaly or masses felt. Normal bowel sounds heard. Central nervous system: Alert and oriented. No focal neurological deficits.  Extremities: No cyanosis clubbing; right BKA. Skin: No petechiae. Psychiatry: Judgement and insight appear normal. Mood & affect appropriate.   Condition at discharge: Stable and improved.  The results of significant diagnostics from this hospitalization (including imaging, microbiology, ancillary and laboratory) are listed below for reference.   Imaging Studies: ECHOCARDIOGRAM LIMITED  Result Date: 02/16/2023    ECHOCARDIOGRAM LIMITED REPORT   Patient Name:   JAVARIE SHEARD Date of Exam: 02/16/2023 Medical Rec #:  [id number]       Height:       69.0 in Accession #:    [id number]      Weight:       237.0 lb Date of Birth:  [birth year]       BSA:          2.221 m Patient Age:    62 years        BP:           155/69 mmHg Patient Gender: M               HR:           80 bpm. [facility] Procedure: Limited Echo, Cardiac  Doppler and Limited Color Doppler Indications:    CHF-Acute Systolic I50.21  History:        Patient has prior history of Echocardiogram examinations, most                 recent 10/13/2022. Cardiomyopathy and CHF, CAD, Prior CABG; Risk                 Factors:Hypertension, Diabetes and Dyslipidemia.  Sonographer:    Celesta Gentile RCS Referring Phys: [id number] OLADAPO ADEFESO IMPRESSIONS  1. Left ventricular ejection fraction, by estimation, is 60 to 65%. The left ventricle has normal function. Left ventricular endocardial border not optimally defined to evaluate regional wall motion. There is moderate left ventricular hypertrophy.  2. Right ventricular systolic function is normal. The right ventricular size is normal.  3. The aortic valve has an indeterminant number of cusps. There is mild calcification of the aortic valve. There is mild thickening of the aortic valve. Aortic valve regurgitation is not visualized. Moderate aortic valve stenosis. Aortic valve mean gradient measures 20.7 mmHg. Aortic valve peak gradient measures 35.3 mmHg. Aortic valve area, by VTI measures 1.17 cm.  FINDINGS  Left Ventricle: Left ventricular ejection fraction, by estimation, is 60 to 65%. The left ventricle has normal function. Left ventricular endocardial border not optimally defined to evaluate regional wall motion. There is moderate left ventricular hypertrophy. Right Ventricle: The right ventricular size is normal. Right vetricular wall thickness was not well visualized. Right ventricular systolic function is normal. Pericardium: There is no evidence of pericardial effusion. Mitral Valve: There is mild thickening of the mitral valve leaflet(s). There is mild calcification of the mitral valve leaflet(s). Mild mitral annular calcification. Aortic Valve: The aortic valve has an indeterminant number of cusps. There is mild calcification of the aortic valve. There is mild thickening of the aortic valve. There is mild aortic valve  annular calcification. Aortic valve regurgitation is not visualized. Moderate aortic stenosis is present. Aortic valve mean gradient measures 20.7 mmHg. Aortic valve peak gradient measures 35.3 mmHg. Aortic valve area, by VTI measures 1.17 cm. Aorta: The aortic root is normal in size and structure. LEFT VENTRICLE PLAX 2D LVIDd:         5.00 cm LVIDs:         3.20 cm  LV PW:         1.40 cm LV IVS:        1.40 cm LVOT diam:     2.10 cm LV SV:         82 LV SV Index:   37 LVOT Area:     3.46 cm  RIGHT VENTRICLE TAPSE (M-mode): 1.6 cm LEFT ATRIUM         Index LA diam:    5.40 cm 2.43 cm/m  AORTIC VALVE AV Area (Vmax):    1.09 cm AV Area (Vmean):   1.20 cm AV Area (VTI):     1.17 cm AV Vmax:           297.00 cm/s AV Vmean:          216.333 cm/s AV VTI:            0.699 m AV Peak Grad:      35.3 mmHg AV Mean Grad:      20.7 mmHg LVOT Vmax:         93.10 cm/s LVOT Vmean:        75.200 cm/s LVOT VTI:          0.236 m LVOT/AV VTI ratio: 0.34  AORTA Ao Root diam: 3.40 cm  SHUNTS Systemic VTI:  0.24 m Systemic Diam: 2.10 cm Dina Rich MD Electronically signed by Dina Rich MD Signature Date/Time: 02/16/2023/11:54:15 AM    Final    DG Chest 1 View  Result Date: 02/16/2023 CLINICAL DATA:  Shortness of breath EXAM: PORTABLE CHEST 1 VIEW COMPARISON:  02/15/2023 FINDINGS: Cardiac shadow is mildly enlarged. Postsurgical changes are again seen. Patient is rotated to the left accentuating the mediastinal markings. The lungs are clear. No bony abnormality is noted. Mild residual central vascular prominence is noted. IMPRESSION: Stable central vascular prominence.  No acute abnormality noted. Electronically Signed   By: Alcide Clever M.D.   On: 02/16/2023 01:03   US RENAL  Result Date: 02/15/2023 CLINICAL DATA:  [id number] AKI (acute kidney injury) [id number] EXAM: RENAL / URINARY TRACT ULTRASOUND COMPLETE COMPARISON:  October 05, 2022, February 14, 2023 FINDINGS: Right Kidney: Renal measurements: 14.5 x 7.6 x 7.7 cm = volume:  442 mL. Echogenicity within normal limits. No mass or hydronephrosis visualized. Left Kidney: Renal measurements: 12.8 x 6 x 7 x 7.0 cm = volume: 315 mL. Echogenicity within normal limits. No mass or hydronephrosis visualized. Bladder: Mildly prominent bladder walls for degree of distension. Other: Trace perinephric fluid adjacent to the inferior pole the LEFT kidney. IMPRESSION: 1. No hydronephrosis. 2. Trace perinephric fluid adjacent to the inferior pole the LEFT kidney, as seen on recent CT. 3. Mildly prominent bladder walls for degree of distension, likely reflecting sequela of chronic outlet obstruction. Electronically Signed   By: Meda Klinefelter M.D.   On: 02/15/2023 11:00   DG CHEST PORT 1 VIEW  Result Date: 02/15/2023 CLINICAL DATA:  69-year-old male with history of acute kidney injury. EXAM: PORTABLE CHEST - 1 VIEW COMPARISON:  None Available. FINDINGS: Unchanged cardiomegaly. Low lung volumes. Bilateral pulmonary vascular crowding. Hazy bibasilar pulmonary opacities. No evidence of pleural effusion or pneumothorax. No acute osseous abnormality. Similar appearance of multiple fractured median sternotomy wires. IMPRESSION: Cardiomegaly with low lung volumes and pulmonary vascular crowding, no definite evidence of overt pulmonary edema. Electronically Signed   By: Marliss Coots M.D.   On: 02/15/2023 08:10   CT ABDOMEN PELVIS WO CONTRAST  Result Date: 02/14/2023 CLINICAL DATA:  Nausea, vomiting, diarrhea  that increased yesterday. EXAM: CT ABDOMEN AND PELVIS WITHOUT CONTRAST TECHNIQUE: Multidetector CT imaging of the abdomen and pelvis was performed following the standard protocol without IV contrast. RADIATION DOSE REDUCTION: This exam was performed according to the departmental dose-optimization program which includes automated exposure control, adjustment of the mA and/or kV according to patient size and/or use of iterative reconstruction technique. COMPARISON:  02/10/2023 FINDINGS: Lower chest:  Interlobular septal thickening in the lower lungs. Scattered centrilobular micro nodules. More dense consolidation in the right middle lobe. Small right and trace left pleural effusions. Coronary artery calcification. Hepatobiliary: Unremarkable noncontrast appearance of the liver. Cholelithiasis. No evidence of cholecystitis. Pancreas: Unremarkable. Spleen: Unremarkable. Adrenals/Urinary Tract: Normal adrenal glands. Increased perinephric stranding bilaterally compared with 02/10/2023. No urinary calculi or hydronephrosis. Unremarkable bladder. Stomach/Bowel: Normal caliber large and small bowel. Borderline wall thickening of the sigmoid colon and rectum. Stomach is within normal limits. Vascular/Lymphatic: Aortic atherosclerosis. No enlarged abdominal or pelvic lymph nodes. Reproductive: Unremarkable. Other: Mild mesenteric edema and trace free fluid in the pelvis. No free intraperitoneal air. Musculoskeletal: Body wall edema.  No acute osseous abnormality. IMPRESSION: 1. Possible mild proctocolitis. 2. Increased nonspecific perinephric stranding compared to 02/10/2023. This may be related to fluid status/heart failure given pulmonary, mesenteric and body wall edema. 3. Interstitial pulmonary edema with small right and trace left pleural effusions. 4. Consolidation in the right middle lobe may reflect atelectasis or pneumonia. Aortic Atherosclerosis (ICD10-I70.0). Electronically Signed   By: Minerva Fester M.D.   On: 02/14/2023 20:51   CT Abdomen Pelvis W Contrast  Result Date: 02/10/2023 CLINICAL DATA:  Follow-up rectal carcinoma. Surveillance. * Tracking Code: BO * EXAM: CT ABDOMEN AND PELVIS WITH CONTRAST TECHNIQUE: Multidetector CT imaging of the abdomen and pelvis was performed using the standard protocol following bolus administration of intravenous contrast. RADIATION DOSE REDUCTION: This exam was performed according to the departmental dose-optimization program which includes automated exposure  control, adjustment of the mA and/or kV according to patient size and/or use of iterative reconstruction technique. CONTRAST:  80mL OMNIPAQUE IOHEXOL 300 MG/ML  SOLN COMPARISON:  06/10/2022 FINDINGS: Lower Chest: No acute findings. Hepatobiliary: No hepatic masses identified. A few tiny calcified gallstones are noted, however there is no evidence of cholecystitis or biliary ductal dilatation. Pancreas:  No mass or inflammatory changes. Spleen: Within normal limits in size and appearance. Adrenals/Urinary Tract: No suspicious masses identified. No evidence of ureteral calculi or hydronephrosis. Stomach/Bowel: No masses identified. No evidence of obstruction, inflammatory process or abnormal fluid collections. Vascular/Lymphatic: No pathologically enlarged lymph nodes. Sub-centimeter retroperitoneal lymph nodes remain stable. No acute vascular findings. Aortic atherosclerotic calcification incidentally noted. Reproductive:  No mass or other significant abnormality. Other:  None. Musculoskeletal:  No suspicious bone lesions identified. IMPRESSION: Stable exam. No evidence of recurrent or metastatic carcinoma within the abdomen or pelvis. Cholelithiasis. No radiographic evidence of cholecystitis. Aortic Atherosclerosis (ICD10-I70.0). Electronically Signed   By: Danae Orleans M.D.   On: 02/10/2023 16:31    Microbiology: Results for orders placed or performed during the hospital encounter of 02/14/23  MRSA Next Gen by PCR, Nasal     Status: Abnormal   Collection Time: 02/14/23  2:39 AM   Specimen: Nasal Mucosa; Nasal Swab  Result Value Ref Range Status   MRSA by PCR Next Gen DETECTED (A) NOT DETECTED Final    Comment: RESULT CALLED TO, READ BACK BY AND VERIFIED WITH: FOLEY B @ 1302 ON [id number] BY HENDERSON L (NOTE) The GeneXpert MRSA Assay (FDA approved for NASAL specimens only),  is one component of a comprehensive MRSA colonization surveillance program. It is not intended to diagnose MRSA infection nor to  guide or monitor treatment for MRSA infections. Test performance is not FDA approved in patients less than 71 years old. Performed at [facility], [address], [place], [place] [address]     Labs: CBC: Recent Labs  Lab 02/14/23 1640 02/16/23 0530 02/17/23 0515  WBC 11.0* 8.9 7.2  NEUTROABS 9.2*  --   --   HGB 8.6* 8.2* 8.1*  HCT 27.1* 25.3* 25.2*  MCV 90.6 88.5 89.7  PLT 319 272 272   Basic Metabolic Panel: Recent Labs  Lab 02/15/23 0329 02/16/23 0328 02/17/23 0515 02/18/23 0405 02/19/23 0504  NA 134* 132* 133* 133* 138  K 5.9* 5.2* 4.2 3.8 3.2*  CL 99 95* 95* 96* 101  CO2 12* 20* 26 25 27   GLUCOSE 150* 92 99 88 84  BUN 77* 84* 82* 78* 69*  CREATININE 6.24* 7.18* 7.33* 6.39* 5.14*  CALCIUM 8.7* 8.3* 8.4* 8.4* 8.6*  MG 1.7  --   --   --   --   PHOS 7.3* 6.6* 7.2* 5.8* 5.1*   Liver Function Tests: Recent Labs  Lab 02/14/23 1755 02/15/23 0329 02/16/23 0328 02/17/23 0515 02/18/23 0405 02/19/23 0504  AST 19 21  --   --   --   --   ALT 11 11  --   --   --   --   ALKPHOS 126 108  --   --   --   --   BILITOT 0.7 0.7  --   --   --   --   PROT 7.6 6.9  --   --   --   --   ALBUMIN 3.3* 3.1* 3.1* 2.9* 3.0* 2.8*   CBG: Recent Labs  Lab 02/18/23 1129 02/18/23 1614 02/18/23 2151 02/19/23 0717 02/19/23 1118  GLUCAP 116* 136* 140* 74 159*    Discharge time spent: greater than 30 minutes.  Signed: Vassie Loll, MD [facility] 02/19/2023

## 2023-02-19 NOTE — Progress Notes (Signed)
Patient ID: Leane ParaWilliam B Herberger, male   DOB: [birth year], 62 y.o.   MRN: [id number] S: No new complaints this morning. O:BP (!) 146/59   Pulse 81   Temp 99.3 F (37.4 C) (Oral)   Resp 20   Ht 5\' 9"  (1.753 m)   Wt 102.3 kg   SpO2 98%   BMI 33.31 kg/m   Intake/Output Summary (Last 24 hours) at 02/19/2023 1028 Last data filed at 02/19/2023 0546 Gross per 24 hour  Intake 340.03 ml  Output 4120 ml  Net -3779.97 ml   Intake/Output: I/O last 3 completed shifts: In: 845.9 [P.O.:240; I.V.:0; IV Piggyback:605.8] Out: 7870 [Urine:7870]  Intake/Output this shift:  No intake/output data recorded. Weight change: -3.751 kg Gen: NAD CVS: RRR Resp: CTA Abd: +BS, soft, NT/ND Ext: no edema, s/p RBKA  Recent Labs  Lab 02/14/23 1755 02/14/23 2207 02/15/23 0329 02/16/23 0328 02/17/23 0515 02/18/23 0405 02/19/23 0504  NA 131* 132* 134* 132* 133* 133* 138  K 6.9* 6.3* 5.9* 5.2* 4.2 3.8 3.2*  CL 96* 98 99 95* 95* 96* 101  CO2 15* 11* 12* 20* 26 25 27   GLUCOSE 69* 107* 150* 92 99 88 84  BUN 78* 80* 77* 84* 82* 78* 69*  CREATININE 6.03* 6.38* 6.24* 7.18* 7.33* 6.39* 5.14*  ALBUMIN 3.3*  --  3.1* 3.1* 2.9* 3.0* 2.8*  CALCIUM 9.0 8.9 8.7* 8.3* 8.4* 8.4* 8.6*  PHOS  --   --  7.3* 6.6* 7.2* 5.8* 5.1*  AST 19  --  21  --   --   --   --   ALT 11  --  11  --   --   --   --    Liver Function Tests: Recent Labs  Lab 02/14/23 1755 02/15/23 0329 02/16/23 0328 02/17/23 0515 02/18/23 0405 02/19/23 0504  AST 19 21  --   --   --   --   ALT 11 11  --   --   --   --   ALKPHOS 126 108  --   --   --   --   BILITOT 0.7 0.7  --   --   --   --   PROT 7.6 6.9  --   --   --   --   ALBUMIN 3.3* 3.1*   < > 2.9* 3.0* 2.8*   < > = values in this interval not displayed.   Recent Labs  Lab 02/14/23 1755  LIPASE 37   No results for input(s): "AMMONIA" in the last 168 hours. CBC: Recent Labs  Lab 02/14/23 1640 02/16/23 0530 02/17/23 0515  WBC 11.0* 8.9 7.2  NEUTROABS 9.2*  --   --   HGB 8.6* 8.2*  8.1*  HCT 27.1* 25.3* 25.2*  MCV 90.6 88.5 89.7  PLT 319 272 272   Cardiac Enzymes: No results for input(s): "CKTOTAL", "CKMB", "CKMBINDEX", "TROPONINI" in the last 168 hours. CBG: Recent Labs  Lab 02/18/23 0745 02/18/23 1129 02/18/23 1614 02/18/23 2151 02/19/23 0717  GLUCAP 76 116* 136* 140* 74    Iron Studies: No results for input(s): "IRON", "TIBC", "TRANSFERRIN", "FERRITIN" in the last 72 hours. Studies/Results: No results found.  amLODipine  10 mg Oral Daily   aspirin EC  81 mg Oral Q breakfast   atorvastatin  40 mg Oral QHS   Chlorhexidine Gluconate Cloth  6 each Topical Daily   Chlorhexidine Gluconate Cloth  6 each Topical Q0600   darbepoetin (ARANESP) injection - NON-DIALYSIS  150 mcg  Subcutaneous Q Mon-1800   doxycycline  100 mg Oral Q12H   famotidine  20 mg Oral Daily   feeding supplement  237 mL Oral BID BM   heparin  5,000 Units Subcutaneous Q8H   insulin aspart  0-6 Units Subcutaneous TID WC   levothyroxine  75 mcg Oral Q0600   metoprolol tartrate  50 mg Oral BID   metroNIDAZOLE  500 mg Oral Q12H   mupirocin ointment  1 Application Nasal BID   sodium bicarbonate  1,300 mg Oral BID   tamsulosin  0.8 mg Oral QPM    BMET    Component Value Date/Time   NA 138 02/19/2023 0504   K 3.2 (L) 02/19/2023 0504   CL 101 02/19/2023 0504   CO2 27 02/19/2023 0504   GLUCOSE 84 02/19/2023 0504   BUN 69 (H) 02/19/2023 0504   CREATININE 5.14 (H) 02/19/2023 0504   CALCIUM 8.6 (L) 02/19/2023 0504   GFRNONAA 12 (L) 02/19/2023 0504   GFRAA >60 08/07/2020 0255   CBC    Component Value Date/Time   WBC 7.2 02/17/2023 0515   RBC 2.81 (L) 02/17/2023 0515   HGB 8.1 (L) 02/17/2023 0515   HCT 25.2 (L) 02/17/2023 0515   PLT 272 02/17/2023 0515   MCV 89.7 02/17/2023 0515   MCH 28.8 02/17/2023 0515   MCHC 32.1 02/17/2023 0515   RDW 12.8 02/17/2023 0515   LYMPHSABS 0.9 02/14/2023 1640   MONOABS 0.7 02/14/2023 1640   EOSABS 0.1 02/14/2023 1640   BASOSABS 0.0 02/14/2023  1640    Assessment/Plan: 62 yo male with HTN, DM, COPD, CAD, rectal cancer, PAD and OSA as well as CKD ( baseline crt high1's-  1.66 on 12/18/22 and 2.0 on 2/9 24)  Now with A on CRF AKI/CKD Stage IIIb with hyperkalemia - u/s= thickened bladder wall consistent with chronic obstructive uropathy even though no hydronephrosis. Patient likely with ATN secondary to volume depletion and contrast nephropathy;s/p 69mL of Ominipaque on 02/10/2023 s/p foley for more strict I&O's as well as high residual on bladder scan. Has had significant and steady improvement of BUN/Cr since foley catheter placement, so BOO likely contributing to his AKI/CKD.  Keep foley catheter in place and he will need to see urology as an outpatient. Good UOP and no evidence of uremia.  No indication for dialysis at this time.  STable for discharge to his SNF from renal standpoint but will need to keep foley catheter in place and follow up with Nephrology and Urology after discharge.  His SNF will need to follow weekly Bmp's until seen as an outpatient.     Maintain MAP>65 for optimal renal perfusion.  Continue strict Input and Output monitoring. Will monitor the patient closely with you and intervene or adjust therapy as indicated by changes in clinical status/labs Hyponatremia secondary to renal failure + long standing h/o diarrhea. Has resolved with foley catheter. 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  also added ESA  CAP with findings suggestive of such on CT-  on rocephin  DM - per primary BPH on Flomax refusing a foley initially but now in place-  tricky part will be when to attempt removal-  not until crt really starts trending down  Disposition - stable for discharge from renal standpoint above.  Will need Nephrology and Urology follow up.  Keep Foley catheter in place for now.   Irena Cords, MD [facility]

## 2023-02-19 NOTE — TOC Transition Note (Signed)
Transition of Care [facility]) - CM/SW Discharge Note   Patient Details  Name: Shane Chambers MRN: [id number] Date of Birth: [birth year]  Transition of Care [facility]) CM/SW Contact:  Leitha Bleak, RN Phone Number: 02/19/2023, 1:54 PM   Clinical Narrative:   Patient ready to discharge back to [facility] [place] provided room number. RN calling report. TOC will call EMS. Medical necessity printed. CM called his mother to update her with discharge plan. DC summary, FL2 and transfer report sent in the Hub.  Final next level of care: Long Term Acute Care (LTAC) Barriers to Discharge: Barriers Resolved   Patient Goals and CMS Choice CMS Medicare.gov Compare Post Acute Care list provided to:: Patient Represenative (must comment) Choice offered to / list presented to : Patient  Discharge Placement               Patient to be transferred to facility by: EMS Name of family member notified: Mother Patient and family notified of of transfer: 02/19/23  Discharge Plan and Services Additional resources added to the After Visit Summary for   In-house Referral: Clinical Social Work   Post Acute Care Choice: Skilled Nursing Facility               Social Determinants of Health (SDOH) Interventions SDOH Screenings   Food Insecurity: No Food Insecurity (02/15/2023)  Housing: Low Risk  (02/15/2023)  Transportation Needs: No Transportation Needs (02/15/2023)  Utilities: Not At Risk (02/15/2023)  Alcohol Screen: Low Risk  (11/05/2022)  Tobacco Use: Medium Risk (02/14/2023)     Readmission Risk Interventions    02/19/2023    1:52 PM 02/15/2023   11:15 AM 11/11/2022    2:05 PM  Readmission Risk Prevention Plan  Transportation Screening Complete Complete   Medication Review Oceanographer) Complete Complete   PCP or Specialist appointment within 3-5 days of discharge Complete    HRI or Home Care Consult Complete Complete   SW Recovery Care/Counseling Consult Complete Complete   Palliative Care  Screening Not Applicable Not Applicable   Skilled Nursing Facility Complete Complete      Information is confidential and restricted. Go to Review Flowsheets to unlock data.

## 2023-02-20 LAB — GLUCOSE, CAPILLARY: Glucose-Capillary: 104 mg/dL — ABNORMAL HIGH (ref 70–99)

## 2023-02-20 LAB — RENAL FUNCTION PANEL
Albumin: 2.9 g/dL — ABNORMAL LOW (ref 3.5–5.0)
Anion gap: 10 (ref 5–15)
BUN: 54 mg/dL — ABNORMAL HIGH (ref 6–20)
CO2: 24 mmol/L (ref 22–32)
Calcium: 8.4 mg/dL — ABNORMAL LOW (ref 8.9–10.3)
Chloride: 101 mmol/L (ref 98–111)
Creatinine, Ser: 3.94 mg/dL — ABNORMAL HIGH (ref 0.61–1.24)
GFR, Estimated: 17 mL/min — ABNORMAL LOW (ref >60)
Glucose, Bld: 80 mg/dL (ref 70–99)
Phosphorus: 3.6 mg/dL (ref 2.5–4.6)
Potassium: 3.2 mmol/L — ABNORMAL LOW (ref 3.5–5.1)
Sodium: 135 mmol/L (ref 135–145)

## 2023-02-20 LAB — POCT I-STAT CREATININE: Creatinine, Ser: 1.9 mg/dL — ABNORMAL HIGH (ref 0.61–1.24)

## 2023-03-02 ENCOUNTER — Encounter (HOSPITAL_COMMUNITY): Payer: Self-pay | Admitting: Hematology

## 2023-03-04 ENCOUNTER — Ambulatory Visit: Payer: Medicare Other | Admitting: Cardiology

## 2023-03-04 NOTE — Progress Notes (Deleted)
Clinical Summary Mr. Doxtater is a 62 y.o.male  seen today for follow up of the following medical problems.    1. CAD - s/p CABG in 03/2020 with LIMA-LAD, RIMA-PL, RA-D1-RI-OM1 - his CABG was in the setting of NSTEMI, committed to 1 year DAPT   - no recent chest pain. CHronic SOB unchanged - compliant with meds     2. Chronic diastolic HF -  - 11/6107 Echo: LVEF 60-65%, mod LVH, grade II DDx, normal RV function - 07/2020 limited echo LVEF 55-60%, grade II DDx (unchanged) - some improvement in LE edema - weights at nursing home 279, slow trending down.    - chronic LE edema, nursing home weight 277 lbs.  - some SOB recently. No orthopnea.    3 Hyperlipidemia - compliant with meds  - no recent labs   4. Cellulitis - admission Jan 2022 with sepsis, cellulitis   5. Rectal cancer - followed by oncology Past Medical History:  Diagnosis Date   Anemia    Arthritis    CAD (coronary artery disease)    a. s/p CABG in 03/2020 with LIMA-LAD, RIMA-PL, RA-D1-RI-OM1   Cancer    rectal   Cellulitis    COPD (chronic obstructive pulmonary disease)    Depression    Diabetes mellitus without complication    GERD (gastroesophageal reflux disease)    History of kidney stones    Hyperlipidemia 12/01/2019   Hypertension    Hypothyroidism    Ischemic cardiomyopathy    a. EF 20-25% by echo in 02/2020 b. at 40% by echo in 03/2020 c. EF normalized to 60-65% by echo in 04/2020   Myocardial infarction    Peripheral vascular disease    Sleep apnea    Type 2 diabetes mellitus      Allergies  Allergen Reactions   Ace Inhibitors Swelling and Cough    (Not on MAR at [name])   Haloperidol And Related     Do NOT give anti-psychotics due to risk of  Torsades and QT prolongation (per Dr. Teena Irani request)   Other Itching    [name]   Shellfish Allergy     Listed on MAR   Penicillins Itching and Rash    Has patient had a PCN reaction causing  immediate rash, facial/tongue/throat swelling, SOB or lightheadedness with hypotension: Unknown Has patient had a PCN reaction causing severe rash involving mucus membranes or skin necrosis: Unknown Has patient had a PCN reaction that required hospitalization: Unknown Has patient had a PCN reaction occurring within the last 10 years: Unknown If all of the above answers are "NO", then may proceed with Cephalosporin use.  Tolerated ancef (12-17-22)      Current Outpatient Medications  Medication Sig Dispense Refill   acetaminophen (TYLENOL) 500 MG tablet Take 1,000 mg by mouth every 8 (eight) hours as needed for fever or moderate pain.     albuterol (VENTOLIN HFA) 108 (90 Base) MCG/ACT inhaler Inhale 2 puffs into the lungs every 6 (six) hours as needed for wheezing or shortness of breath.     amLODipine (NORVASC) 10 MG tablet Take 1 tablet (10 mg total) by mouth daily.     aspirin 81 MG EC tablet Take 1 tablet (81 mg total) by mouth daily with breakfast. 30 tablet 12   atorvastatin (LIPITOR) 40 MG tablet Take 1 tablet (40 mg total) by mouth at bedtime.     budesonide-formoterol (SYMBICORT) 160-4.5 MCG/ACT inhaler Inhale 2  puffs into the lungs in the morning and at bedtime. 1 Inhaler 6   Cholecalciferol 125 MCG (5000 UT) TABS Take 1 tablet by mouth every Monday. (Patient not taking: Reported on 02/17/2023)     ferrous gluconate (FERGON) 324 MG tablet Take 324 mg by mouth daily.     gabapentin (NEURONTIN) 300 MG capsule Take 1 capsule (300 mg total) by mouth at bedtime.     Glucerna (GLUCERNA) LIQD Take 1 Can by mouth with breakfast, with lunch, and with evening meal.     insulin aspart (NOVOLOG FLEXPEN) 100 UNIT/ML FlexPen 0-15 Units, Subcutaneous, 3 times daily with meals CBG < 70: implement hypoglycemia protocol-call MD CBG 70 - 120: 0 units CBG 121 - 150: 2 units CBG 151 - 200: 3 units CBG 201 - 250: 5 units CBG 251 - 300: 8 units CBG 301 - 350: 11 units CBG 351 - 400: 15 units CBG > 400:  15 mL 0   ipratropium-albuterol (DUONEB) 0.5-2.5 (3) MG/3ML SOLN Take 3 mLs by nebulization every 6 (six) hours as needed. (Patient taking differently: Take 3 mLs by nebulization every 6 (six) hours as needed (SOB/wheezing).) 360 mL    levothyroxine (SYNTHROID) 75 MCG tablet Take 75 mcg by mouth daily before breakfast.     metoprolol tartrate (LOPRESSOR) 50 MG tablet Take 1 tablet (50 mg total) by mouth 2 (two) times daily.     mirtazapine (REMERON) 30 MG tablet Take 30 mg by mouth at bedtime.     Multiple Vitamin (MULTIVITAMIN) tablet Take 1 tablet by mouth daily.     omeprazole (PRILOSEC) 40 MG capsule Take 40 mg by mouth daily.     polyethylene glycol (MIRALAX / GLYCOLAX) 17 g packet Take 17 g by mouth daily as needed for moderate constipation.     polyvinyl alcohol (LIQUIFILM TEARS) 1.4 % ophthalmic solution Place 1 drop into both eyes daily.     sodium bicarbonate 650 MG tablet Take 1 tablet (650 mg total) by mouth 2 (two) times daily. 60 tablet 0   tamsulosin (FLOMAX) 0.4 MG CAPS capsule Take 0.8 mg by mouth every evening.     thiamine (VITAMIN B-1) 100 MG tablet Take 1 tablet (100 mg total) by mouth daily.     venlafaxine XR (EFFEXOR-XR) 150 MG 24 hr capsule Take 1 capsule (150 mg total) by mouth daily with breakfast.     No current facility-administered medications for this visit.     Past Surgical History:  Procedure Laterality Date   AMPUTATION Right 10/05/2022   Procedure: AMPUTATION BELOW KNEE;  Surgeon: Nadara Mustard, MD;  Location: [facility] OR;  Service: Orthopedics;  Laterality: Right;   AMPUTATION Right 11/14/2022   Procedure: AMPUTATION BELOW KNEE REVISION; WASHOUT;  Surgeon: Renford Dills, MD;  Location: [facility] ORS;  Service: Vascular;  Laterality: Right;   AMPUTATION Right 11/18/2022   Procedure: AMPUTATION BELOW KNEE REVISION AND CLOSURE;  Surgeon: Renford Dills, MD;  Location: [facility] ORS;  Service: Vascular;  Laterality: Right;   APPLICATION OF WOUND VAC Right 10/05/2022    Procedure: APPLICATION OF WOUND VAC;  Surgeon: Nadara Mustard, MD;  Location: [facility] OR;  Service: Orthopedics;  Laterality: Right;   BIOPSY  07/18/2021   Procedure: BIOPSY;  Surgeon: Lanelle Bal, DO;  [facility]: [facility];  Service: Endoscopy;;   BIOPSY  01/09/2022   Procedure: BIOPSY;  Surgeon: Lanelle Bal, DO;  [facility];  Service: Endoscopy;;   COLONOSCOPY WITH PROPOFOL N/A 07/18/2021  Carver: 15 millimeter polyp removed from the sigmoid colon, 5 mm polyp removed from sigmoid colon.  Nonbleeding internal hemorrhoids.  Significant looping of the colon. sigmoid path showed invasive colonic adenocarcinoma involving tubular adenoma (invades to depth of 2mm, carcinoma 1mm from margin, no lymphovascular invasion, no poorly differentiated component.   CORONARY ARTERY BYPASS GRAFT N/A 03/13/2020   Procedure: CORONARY ARTERY BYPASS GRAFTING (CABG) times five using bilateral Internal mammary arteries and left radial artery;  Surgeon: Linden Dolin, MD;  Location: [facility] OR;  Service: Open Heart Surgery;  Laterality: N/A;   DENTAL SURGERY     ESOPHAGOGASTRODUODENOSCOPY (EGD) WITH PROPOFOL N/A 07/18/2021   Carver: 1 gastric polyp status post biopsy, gastritis. gastric bx with slight chronic inflammation and no H.pyori. GEJ polypectomy with mild inflammation only   FLEXIBLE SIGMOIDOSCOPY N/A 08/26/2021   Carver: Nonbleeding internal hemorrhoids.  15 mm ulcers from previous polypectomy found in the rectum.  No evidence of previous polyp.  Located 5 to 8 cm from anal verge.   FLEXIBLE SIGMOIDOSCOPY N/A 01/09/2022   Procedure: FLEXIBLE SIGMOIDOSCOPY;  Surgeon: Lanelle Bal, DO;  [facility];  Service: Endoscopy;  Laterality: N/A;   POLYPECTOMY  07/18/2021   Procedure: POLYPECTOMY INTESTINAL;  Surgeon: Lanelle Bal, DO;  [facility];  Service: Endoscopy;;   RADIAL ARTERY HARVEST Left 03/13/2020   Procedure: RADIAL ARTERY HARVEST,;  Surgeon: Linden Dolin, MD;  Location: [facility] OR;  Service: Open Heart Surgery;  Laterality: Left;   RIGHT/LEFT HEART CATH AND CORONARY ANGIOGRAPHY N/A 03/07/2020   Procedure: RIGHT/LEFT HEART CATH AND CORONARY ANGIOGRAPHY;  Surgeon: Tonny Bollman, MD;  Location: [facility] INVASIVE CV LAB;  Service: Cardiovascular;  Laterality: N/A;   STUMP REVISION Right 12/17/2022   Procedure: RIGHT BELOW KNEE AMPUTATION REVISION;  Surgeon: Nadara Mustard, MD;  Location: [facility] OR;  Service: Orthopedics;  Laterality: Right;   SUBMUCOSAL LIFTING INJECTION  01/09/2022   Procedure: SUBMUCOSAL LIFTING INJECTION;  Surgeon: Lanelle Bal, DO;  [facility];  Service: Endoscopy;;   SUBMUCOSAL TATTOO INJECTION  01/09/2022   Procedure: SUBMUCOSAL TATTOO INJECTION;  Surgeon: Lanelle Bal, DO;  [facility];  Service: Endoscopy;;   TEE WITHOUT CARDIOVERSION N/A 03/13/2020   Procedure: TRANSESOPHAGEAL ECHOCARDIOGRAM (TEE);  Surgeon: Linden Dolin, MD;  Location: [facility] OR;  Service: Open Heart Surgery;  Laterality: N/A;     Allergies  Allergen Reactions   Ace Inhibitors Swelling and Cough    (Not on MAR at [name])   Haloperidol And Related     Do NOT give anti-psychotics due to risk of  Torsades and QT prolongation (per Dr. Teena Irani request)   Other Itching    [name]   Shellfish Allergy     Listed on MAR   Penicillins Itching and Rash    Has patient had a PCN reaction causing immediate rash, facial/tongue/throat swelling, SOB or lightheadedness with hypotension: Unknown Has patient had a PCN reaction causing severe rash involving mucus membranes or skin necrosis: Unknown Has patient had a PCN reaction that required hospitalization: Unknown Has patient had a PCN reaction occurring within the last 10 years: Unknown If all of the above answers are "NO", then may proceed with Cephalosporin use.  Tolerated ancef (12-17-22)       Family History  Problem Relation Age of  Onset   Hypertension Mother      Social History Mr. Delaine reports that he quit smoking about 53  years ago. His smoking use included cigarettes. He smoked an average of 1.5 packs per day. He has never used smokeless tobacco. Mr. Loflin reports that he does not currently use alcohol.   Review of Systems CONSTITUTIONAL: No weight loss, fever, chills, weakness or fatigue.  HEENT: Eyes: No visual loss, blurred vision, double vision or yellow sclerae.No hearing loss, sneezing, congestion, runny nose or sore throat.  SKIN: No rash or itching.  CARDIOVASCULAR:  RESPIRATORY: No shortness of breath, cough or sputum.  GASTROINTESTINAL: No anorexia, nausea, vomiting or diarrhea. No abdominal pain or blood.  GENITOURINARY: No burning on urination, no polyuria NEUROLOGICAL: No headache, dizziness, syncope, paralysis, ataxia, numbness or tingling in the extremities. No change in bowel or bladder control.  MUSCULOSKELETAL: No muscle, back pain, joint pain or stiffness.  LYMPHATICS: No enlarged nodes. No history of splenectomy.  PSYCHIATRIC: No history of depression or anxiety.  ENDOCRINOLOGIC: No reports of sweating, cold or heat intolerance. No polyuria or polydipsia.  Marland Kitchen   Physical Examination There were no vitals filed for this visit. There were no vitals filed for this visit.  Gen: resting comfortably, no acute distress HEENT: no scleral icterus, pupils equal round and reactive, no palptable cervical adenopathy,  CV Resp: Clear to auscultation bilaterally GI: abdomen is soft, non-tender, non-distended, normal bowel sounds, no hepatosplenomegaly MSK: extremities are warm, no edema.  Skin: warm, no rash Neuro:  no focal deficits Psych: appropriate affect   Diagnostic Studies     Assessment and Plan        Antoine Poche, M.D., F.A.C.C.

## 2023-03-05 ENCOUNTER — Ambulatory Visit: Payer: Medicare Other | Admitting: Internal Medicine

## 2023-03-26 ENCOUNTER — Inpatient Hospital Stay
Admission: EM | Admit: 2023-03-26 | Discharge: 2023-04-02 | DRG: 291 | Disposition: A | Discharge status: Skilled Nursing Facility | Payer: Medicare Other | Source: Skilled Nursing Facility | Attending: Family Medicine | Admitting: Family Medicine

## 2023-03-26 ENCOUNTER — Emergency Department: Payer: Medicare Other

## 2023-03-26 DIAGNOSIS — Z89511 Acquired absence of right leg below knee: Secondary | ICD-10-CM

## 2023-03-26 DIAGNOSIS — R338 Other retention of urine: Secondary | ICD-10-CM | POA: Insufficient documentation

## 2023-03-26 DIAGNOSIS — Z91013 Allergy to seafood: Secondary | ICD-10-CM

## 2023-03-26 DIAGNOSIS — N138 Other obstructive and reflux uropathy: Secondary | ICD-10-CM | POA: Diagnosis present

## 2023-03-26 DIAGNOSIS — K5900 Constipation, unspecified: Secondary | ICD-10-CM | POA: Diagnosis present

## 2023-03-26 DIAGNOSIS — I5033 Acute on chronic diastolic (congestive) heart failure: Secondary | ICD-10-CM | POA: Diagnosis present

## 2023-03-26 DIAGNOSIS — Z1152 Encounter for screening for COVID-19: Secondary | ICD-10-CM

## 2023-03-26 DIAGNOSIS — Z7982 Long term (current) use of aspirin: Secondary | ICD-10-CM

## 2023-03-26 DIAGNOSIS — Z794 Long term (current) use of insulin: Secondary | ICD-10-CM

## 2023-03-26 DIAGNOSIS — E119 Type 2 diabetes mellitus without complications: Secondary | ICD-10-CM

## 2023-03-26 DIAGNOSIS — J9601 Acute respiratory failure with hypoxia: Secondary | ICD-10-CM | POA: Diagnosis present

## 2023-03-26 DIAGNOSIS — E039 Hypothyroidism, unspecified: Secondary | ICD-10-CM

## 2023-03-26 DIAGNOSIS — I255 Ischemic cardiomyopathy: Secondary | ICD-10-CM | POA: Diagnosis present

## 2023-03-26 DIAGNOSIS — Z8249 Family history of ischemic heart disease and other diseases of the circulatory system: Secondary | ICD-10-CM

## 2023-03-26 DIAGNOSIS — I2510 Atherosclerotic heart disease of native coronary artery without angina pectoris: Secondary | ICD-10-CM | POA: Diagnosis present

## 2023-03-26 DIAGNOSIS — E1122 Type 2 diabetes mellitus with diabetic chronic kidney disease: Secondary | ICD-10-CM | POA: Diagnosis present

## 2023-03-26 DIAGNOSIS — Z951 Presence of aortocoronary bypass graft: Secondary | ICD-10-CM

## 2023-03-26 DIAGNOSIS — E861 Hypovolemia: Secondary | ICD-10-CM | POA: Diagnosis present

## 2023-03-26 DIAGNOSIS — N401 Enlarged prostate with lower urinary tract symptoms: Secondary | ICD-10-CM | POA: Diagnosis present

## 2023-03-26 DIAGNOSIS — N400 Enlarged prostate without lower urinary tract symptoms: Secondary | ICD-10-CM | POA: Diagnosis present

## 2023-03-26 DIAGNOSIS — K219 Gastro-esophageal reflux disease without esophagitis: Secondary | ICD-10-CM | POA: Diagnosis present

## 2023-03-26 DIAGNOSIS — J441 Chronic obstructive pulmonary disease with (acute) exacerbation: Secondary | ICD-10-CM

## 2023-03-26 DIAGNOSIS — I252 Old myocardial infarction: Secondary | ICD-10-CM

## 2023-03-26 DIAGNOSIS — I350 Nonrheumatic aortic (valve) stenosis: Secondary | ICD-10-CM

## 2023-03-26 DIAGNOSIS — Z880 Allergy status to penicillin: Secondary | ICD-10-CM

## 2023-03-26 DIAGNOSIS — E1151 Type 2 diabetes mellitus with diabetic peripheral angiopathy without gangrene: Secondary | ICD-10-CM | POA: Diagnosis present

## 2023-03-26 DIAGNOSIS — Z888 Allergy status to other drugs, medicaments and biological substances status: Secondary | ICD-10-CM

## 2023-03-26 DIAGNOSIS — Z87891 Personal history of nicotine dependence: Secondary | ICD-10-CM

## 2023-03-26 DIAGNOSIS — Z79899 Other long term (current) drug therapy: Secondary | ICD-10-CM

## 2023-03-26 DIAGNOSIS — I10 Essential (primary) hypertension: Secondary | ICD-10-CM | POA: Diagnosis present

## 2023-03-26 DIAGNOSIS — N184 Chronic kidney disease, stage 4 (severe): Secondary | ICD-10-CM | POA: Insufficient documentation

## 2023-03-26 DIAGNOSIS — Z7951 Long term (current) use of inhaled steroids: Secondary | ICD-10-CM

## 2023-03-26 DIAGNOSIS — E785 Hyperlipidemia, unspecified: Secondary | ICD-10-CM | POA: Insufficient documentation

## 2023-03-26 DIAGNOSIS — E876 Hypokalemia: Secondary | ICD-10-CM | POA: Diagnosis present

## 2023-03-26 DIAGNOSIS — I130 Hypertensive heart and chronic kidney disease with heart failure and stage 1 through stage 4 chronic kidney disease, or unspecified chronic kidney disease: Secondary | ICD-10-CM | POA: Diagnosis not present

## 2023-03-26 DIAGNOSIS — Z85048 Personal history of other malignant neoplasm of rectum, rectosigmoid junction, and anus: Secondary | ICD-10-CM

## 2023-03-26 DIAGNOSIS — Z79890 Hormone replacement therapy (postmenopausal): Secondary | ICD-10-CM

## 2023-03-26 DIAGNOSIS — N1831 Chronic kidney disease, stage 3a: Secondary | ICD-10-CM | POA: Insufficient documentation

## 2023-03-26 DIAGNOSIS — Z87442 Personal history of urinary calculi: Secondary | ICD-10-CM

## 2023-03-26 DIAGNOSIS — D509 Iron deficiency anemia, unspecified: Secondary | ICD-10-CM | POA: Diagnosis present

## 2023-03-26 DIAGNOSIS — F32A Depression, unspecified: Secondary | ICD-10-CM | POA: Diagnosis present

## 2023-03-26 LAB — CBC WITH DIFFERENTIAL/PLATELET
Abs Immature Granulocytes: 0.05 10*3/uL (ref 0.00–0.07)
Basophils Absolute: 0 10*3/uL (ref 0.0–0.1)
Basophils Relative: 1 %
Eosinophils Absolute: 0.5 10*3/uL (ref 0.0–0.5)
Eosinophils Relative: 5 %
HCT: 29.4 % — ABNORMAL LOW (ref 39.0–52.0)
Hemoglobin: 9.4 g/dL — ABNORMAL LOW (ref 13.0–17.0)
Immature Granulocytes: 1 %
Lymphocytes Relative: 14 %
Lymphs Abs: 1.2 10*3/uL (ref 0.7–4.0)
MCH: 28.5 pg (ref 26.0–34.0)
MCHC: 32 g/dL (ref 30.0–36.0)
MCV: 89.1 fL (ref 80.0–100.0)
Monocytes Absolute: 0.5 10*3/uL (ref 0.1–1.0)
Monocytes Relative: 6 %
Neutro Abs: 6.2 10*3/uL (ref 1.7–7.7)
Neutrophils Relative %: 73 %
Platelets: 305 10*3/uL (ref 150–400)
RBC: 3.3 MIL/uL — ABNORMAL LOW (ref 4.22–5.81)
RDW: 13.6 % (ref 11.5–15.5)
WBC: 8.5 10*3/uL (ref 4.0–10.5)
nRBC: 0 % (ref 0.0–0.2)

## 2023-03-26 MED ORDER — MAGNESIUM SULFATE 2 GM/50ML IV SOLN
2.0000 g | Freq: Once | INTRAVENOUS | Status: AC
  Administered 2023-03-27: 2 g via INTRAVENOUS
  Filled 2023-03-26: qty 50

## 2023-03-26 MED ORDER — IPRATROPIUM-ALBUTEROL 0.5-2.5 (3) MG/3ML IN SOLN
3.0000 mL | Freq: Once | RESPIRATORY_TRACT | Status: AC
  Administered 2023-03-27: 3 mL via RESPIRATORY_TRACT
  Filled 2023-03-26: qty 3

## 2023-03-26 MED ORDER — ALBUTEROL SULFATE (2.5 MG/3ML) 0.083% IN NEBU
2.5000 mg | INHALATION_SOLUTION | Freq: Once | RESPIRATORY_TRACT | Status: AC
  Administered 2023-03-27: 2.5 mg via RESPIRATORY_TRACT
  Filled 2023-03-26: qty 3

## 2023-03-26 NOTE — ED Triage Notes (Addendum)
Arrives via [facility] from [facility] and [facility] with chief complaint of SOB x5 days.  Patient attempted to treat at home with albuterol inhaler with little to no relief.  Placed on 2L [place] on scene, EMS noted wheezing on left side.  Received 125mg  of Solu-medrol and x2 DuoNebs en route.  Patient endorses non-productive cough.  Denies any fever, known sick-contacts, chest pain, or N/V/D.

## 2023-03-27 ENCOUNTER — Encounter: Payer: Self-pay | Admitting: Family Medicine

## 2023-03-27 DIAGNOSIS — N1831 Chronic kidney disease, stage 3a: Secondary | ICD-10-CM | POA: Diagnosis present

## 2023-03-27 DIAGNOSIS — E119 Type 2 diabetes mellitus without complications: Secondary | ICD-10-CM

## 2023-03-27 DIAGNOSIS — J441 Chronic obstructive pulmonary disease with (acute) exacerbation: Secondary | ICD-10-CM | POA: Diagnosis present

## 2023-03-27 DIAGNOSIS — K219 Gastro-esophageal reflux disease without esophagitis: Secondary | ICD-10-CM | POA: Diagnosis present

## 2023-03-27 DIAGNOSIS — N401 Enlarged prostate with lower urinary tract symptoms: Secondary | ICD-10-CM | POA: Diagnosis present

## 2023-03-27 DIAGNOSIS — F32A Depression, unspecified: Secondary | ICD-10-CM | POA: Diagnosis present

## 2023-03-27 DIAGNOSIS — I5033 Acute on chronic diastolic (congestive) heart failure: Secondary | ICD-10-CM

## 2023-03-27 DIAGNOSIS — I10 Essential (primary) hypertension: Secondary | ICD-10-CM

## 2023-03-27 DIAGNOSIS — D649 Anemia, unspecified: Secondary | ICD-10-CM

## 2023-03-27 DIAGNOSIS — N138 Other obstructive and reflux uropathy: Secondary | ICD-10-CM | POA: Diagnosis present

## 2023-03-27 DIAGNOSIS — Z79890 Hormone replacement therapy (postmenopausal): Secondary | ICD-10-CM | POA: Diagnosis not present

## 2023-03-27 DIAGNOSIS — E1169 Type 2 diabetes mellitus with other specified complication: Secondary | ICD-10-CM

## 2023-03-27 DIAGNOSIS — Z1152 Encounter for screening for COVID-19: Secondary | ICD-10-CM | POA: Diagnosis not present

## 2023-03-27 DIAGNOSIS — E876 Hypokalemia: Secondary | ICD-10-CM | POA: Diagnosis present

## 2023-03-27 DIAGNOSIS — Z89511 Acquired absence of right leg below knee: Secondary | ICD-10-CM | POA: Diagnosis not present

## 2023-03-27 DIAGNOSIS — J9601 Acute respiratory failure with hypoxia: Secondary | ICD-10-CM | POA: Diagnosis present

## 2023-03-27 DIAGNOSIS — I255 Ischemic cardiomyopathy: Secondary | ICD-10-CM | POA: Diagnosis present

## 2023-03-27 DIAGNOSIS — I350 Nonrheumatic aortic (valve) stenosis: Secondary | ICD-10-CM | POA: Diagnosis present

## 2023-03-27 DIAGNOSIS — E785 Hyperlipidemia, unspecified: Secondary | ICD-10-CM | POA: Diagnosis present

## 2023-03-27 DIAGNOSIS — E039 Hypothyroidism, unspecified: Secondary | ICD-10-CM | POA: Diagnosis present

## 2023-03-27 DIAGNOSIS — I25118 Atherosclerotic heart disease of native coronary artery with other forms of angina pectoris: Secondary | ICD-10-CM

## 2023-03-27 DIAGNOSIS — Z794 Long term (current) use of insulin: Secondary | ICD-10-CM | POA: Diagnosis not present

## 2023-03-27 DIAGNOSIS — I130 Hypertensive heart and chronic kidney disease with heart failure and stage 1 through stage 4 chronic kidney disease, or unspecified chronic kidney disease: Secondary | ICD-10-CM | POA: Diagnosis present

## 2023-03-27 DIAGNOSIS — R338 Other retention of urine: Secondary | ICD-10-CM | POA: Diagnosis present

## 2023-03-27 DIAGNOSIS — E1122 Type 2 diabetes mellitus with diabetic chronic kidney disease: Secondary | ICD-10-CM | POA: Diagnosis present

## 2023-03-27 DIAGNOSIS — D509 Iron deficiency anemia, unspecified: Secondary | ICD-10-CM | POA: Diagnosis present

## 2023-03-27 DIAGNOSIS — E1151 Type 2 diabetes mellitus with diabetic peripheral angiopathy without gangrene: Secondary | ICD-10-CM | POA: Diagnosis present

## 2023-03-27 DIAGNOSIS — I2510 Atherosclerotic heart disease of native coronary artery without angina pectoris: Secondary | ICD-10-CM | POA: Diagnosis present

## 2023-03-27 DIAGNOSIS — N184 Chronic kidney disease, stage 4 (severe): Secondary | ICD-10-CM | POA: Insufficient documentation

## 2023-03-27 HISTORY — DX: Acute on chronic diastolic (congestive) heart failure: I50.33

## 2023-03-27 LAB — GLUCOSE, CAPILLARY
Glucose-Capillary: 241 mg/dL — ABNORMAL HIGH (ref 70–99)
Glucose-Capillary: 169 mg/dL — ABNORMAL HIGH (ref 70–99)
Glucose-Capillary: 188 mg/dL — ABNORMAL HIGH (ref 70–99)

## 2023-03-27 LAB — CBC
HCT: 30.7 % — ABNORMAL LOW (ref 39.0–52.0)
Hemoglobin: 9.7 g/dL — ABNORMAL LOW (ref 13.0–17.0)
MCH: 28 pg (ref 26.0–34.0)
MCHC: 31.6 g/dL (ref 30.0–36.0)
MCV: 88.5 fL (ref 80.0–100.0)
Platelets: 312 10*3/uL (ref 150–400)
RBC: 3.47 MIL/uL — ABNORMAL LOW (ref 4.22–5.81)
RDW: 13.6 % (ref 11.5–15.5)
WBC: 6.9 10*3/uL (ref 4.0–10.5)
nRBC: 0 % (ref 0.0–0.2)

## 2023-03-27 LAB — BASIC METABOLIC PANEL
Anion gap: 10 (ref 5–15)
BUN: 37 mg/dL — ABNORMAL HIGH (ref 6–20)
CO2: 23 mmol/L (ref 22–32)
Calcium: 8.6 mg/dL — ABNORMAL LOW (ref 8.9–10.3)
Chloride: 109 mmol/L (ref 98–111)
Creatinine, Ser: 1.6 mg/dL — ABNORMAL HIGH (ref 0.61–1.24)
GFR, Estimated: 49 mL/min — ABNORMAL LOW (ref >60)
Glucose, Bld: 216 mg/dL — ABNORMAL HIGH (ref 70–99)
Potassium: 3.4 mmol/L — ABNORMAL LOW (ref 3.5–5.1)
Sodium: 142 mmol/L (ref 135–145)

## 2023-03-27 LAB — BLOOD GAS, VENOUS
Acid-Base Excess: 0.3 mmol/L (ref 0.0–2.0)
Bicarbonate: 24.6 mmol/L (ref 20.0–28.0)
O2 Saturation: 90.5 %
Patient temperature: 37
pCO2, Ven: 38 mmHg — ABNORMAL LOW (ref 44–60)
pH, Ven: 7.42 (ref 7.25–7.43)
pO2, Ven: 58 mmHg — ABNORMAL HIGH (ref 32–45)

## 2023-03-27 LAB — URINALYSIS, W/ REFLEX TO CULTURE (INFECTION SUSPECTED)
Bilirubin Urine: NEGATIVE
Glucose, UA: 50 mg/dL — AB
Hgb urine dipstick: NEGATIVE
Ketones, ur: NEGATIVE mg/dL
Leukocytes,Ua: NEGATIVE
Nitrite: NEGATIVE
Protein, ur: >=300 mg/dL — AB
Specific Gravity, Urine: 1.018 (ref 1.005–1.030)
pH: 5 (ref 5.0–8.0)

## 2023-03-27 LAB — PROCALCITONIN: Procalcitonin: <0.1 ng/mL

## 2023-03-27 LAB — COMPREHENSIVE METABOLIC PANEL
ALT: 24 U/L (ref 0–44)
AST: 28 U/L (ref 15–41)
Albumin: 2.8 g/dL — ABNORMAL LOW (ref 3.5–5.0)
Alkaline Phosphatase: 124 U/L (ref 38–126)
Anion gap: 9 (ref 5–15)
BUN: 33 mg/dL — ABNORMAL HIGH (ref 6–20)
CO2: 24 mmol/L (ref 22–32)
Calcium: 8.6 mg/dL — ABNORMAL LOW (ref 8.9–10.3)
Chloride: 106 mmol/L (ref 98–111)
Creatinine, Ser: 1.54 mg/dL — ABNORMAL HIGH (ref 0.61–1.24)
GFR, Estimated: 51 mL/min — ABNORMAL LOW (ref >60)
Glucose, Bld: 122 mg/dL — ABNORMAL HIGH (ref 70–99)
Potassium: 3.2 mmol/L — ABNORMAL LOW (ref 3.5–5.1)
Sodium: 139 mmol/L (ref 135–145)
Total Bilirubin: 0.7 mg/dL (ref 0.3–1.2)
Total Protein: 7.3 g/dL (ref 6.5–8.1)

## 2023-03-27 LAB — TROPONIN I (HIGH SENSITIVITY)
Troponin I (High Sensitivity): 15 ng/L (ref <18)
Troponin I (High Sensitivity): 18 ng/L — ABNORMAL HIGH (ref <18)

## 2023-03-27 LAB — MAGNESIUM: Magnesium: 1.7 mg/dL (ref 1.7–2.4)

## 2023-03-27 LAB — SARS CORONAVIRUS 2 BY RT PCR: SARS Coronavirus 2 by RT PCR: NEGATIVE

## 2023-03-27 LAB — BRAIN NATRIURETIC PEPTIDE: B Natriuretic Peptide: 891.3 pg/mL — ABNORMAL HIGH (ref 0.0–100.0)

## 2023-03-27 MED ORDER — METOPROLOL TARTRATE 50 MG PO TABS
50.0000 mg | ORAL_TABLET | Freq: Two times a day (BID) | ORAL | Status: DC
  Administered 2023-03-27: 50 mg via ORAL
  Filled 2023-03-27: qty 1

## 2023-03-27 MED ORDER — LEVOTHYROXINE SODIUM 50 MCG PO TABS
75.0000 ug | ORAL_TABLET | Freq: Every day | ORAL | Status: DC
  Administered 2023-03-27 – 2023-04-02 (×7): 75 ug via ORAL
  Filled 2023-03-27 (×6): qty 1
  Filled 2023-03-27: qty 2

## 2023-03-27 MED ORDER — ADULT MULTIVITAMIN W/MINERALS CH
1.0000 | ORAL_TABLET | Freq: Every day | ORAL | Status: DC
  Administered 2023-03-27 – 2023-04-02 (×7): 1 via ORAL
  Filled 2023-03-27 (×7): qty 1

## 2023-03-27 MED ORDER — ATORVASTATIN CALCIUM 20 MG PO TABS
40.0000 mg | ORAL_TABLET | Freq: Every day | ORAL | Status: DC
  Administered 2023-03-27 – 2023-04-01 (×6): 40 mg via ORAL
  Filled 2023-03-27 (×6): qty 2

## 2023-03-27 MED ORDER — FUROSEMIDE 10 MG/ML IJ SOLN
4.0000 mg/h | INTRAVENOUS | Status: DC
  Filled 2023-03-27: qty 20

## 2023-03-27 MED ORDER — FUROSEMIDE 10 MG/ML IJ SOLN
40.0000 mg | Freq: Two times a day (BID) | INTRAMUSCULAR | Status: DC
  Administered 2023-03-27: 40 mg via INTRAVENOUS
  Filled 2023-03-27: qty 4

## 2023-03-27 MED ORDER — IPRATROPIUM-ALBUTEROL 0.5-2.5 (3) MG/3ML IN SOLN: 3.0000 mL | Freq: Four times a day (QID) | RESPIRATORY_TRACT | Status: DC | PRN

## 2023-03-27 MED ORDER — TRAZODONE HCL 50 MG PO TABS
25.0000 mg | ORAL_TABLET | Freq: Every evening | ORAL | Status: DC | PRN
  Administered 2023-03-28 – 2023-04-01 (×3): 25 mg via ORAL
  Filled 2023-03-27 (×4): qty 1

## 2023-03-27 MED ORDER — VITAMIN D 25 MCG (1000 UNIT) PO TABS
5000.0000 [IU] | ORAL_TABLET | ORAL | Status: DC
  Administered 2023-03-30: 5000 [IU] via ORAL
  Filled 2023-03-27: qty 5

## 2023-03-27 MED ORDER — AMLODIPINE BESYLATE 10 MG PO TABS
10.0000 mg | ORAL_TABLET | Freq: Every day | ORAL | Status: DC
  Administered 2023-03-27 – 2023-04-02 (×7): 10 mg via ORAL
  Filled 2023-03-27 (×6): qty 1
  Filled 2023-03-27: qty 2

## 2023-03-27 MED ORDER — CHLORHEXIDINE GLUCONATE CLOTH 2 % EX PADS
6.0000 | MEDICATED_PAD | Freq: Every day | CUTANEOUS | Status: DC
  Administered 2023-03-27 – 2023-04-02 (×7): 6 via TOPICAL

## 2023-03-27 MED ORDER — FUROSEMIDE 10 MG/ML IJ SOLN
40.0000 mg | Freq: Once | INTRAMUSCULAR | Status: AC
  Administered 2023-03-27: 40 mg via INTRAVENOUS
  Filled 2023-03-27: qty 4

## 2023-03-27 MED ORDER — THIAMINE MONONITRATE 100 MG PO TABS
100.0000 mg | ORAL_TABLET | Freq: Every day | ORAL | Status: DC
  Administered 2023-03-27 – 2023-04-02 (×7): 100 mg via ORAL
  Filled 2023-03-27 (×7): qty 1

## 2023-03-27 MED ORDER — GLUCERNA SHAKE PO LIQD
237.0000 mL | Freq: Three times a day (TID) | ORAL | Status: DC
  Administered 2023-03-27 – 2023-04-02 (×16): 237 mL via ORAL

## 2023-03-27 MED ORDER — CARVEDILOL 25 MG PO TABS
25.0000 mg | ORAL_TABLET | Freq: Two times a day (BID) | ORAL | Status: DC
  Administered 2023-03-27 – 2023-04-02 (×12): 25 mg via ORAL
  Filled 2023-03-27 (×12): qty 1

## 2023-03-27 MED ORDER — POLYVINYL ALCOHOL 1.4 % OP SOLN: 1.0000 [drp] | OPHTHALMIC | Status: DC | PRN

## 2023-03-27 MED ORDER — INSULIN ASPART 100 UNIT/ML IJ SOLN
0.0000 [IU] | Freq: Every day | INTRAMUSCULAR | Status: DC
  Filled 2023-03-27: qty 1

## 2023-03-27 MED ORDER — SODIUM CHLORIDE 0.9 % IV SOLN
1.0000 g | INTRAVENOUS | Status: DC
  Administered 2023-03-27: 1 g via INTRAVENOUS
  Filled 2023-03-27: qty 10

## 2023-03-27 MED ORDER — TAMSULOSIN HCL 0.4 MG PO CAPS
0.4000 mg | ORAL_CAPSULE | Freq: Every evening | ORAL | Status: DC
  Administered 2023-03-27 – 2023-04-01 (×6): 0.4 mg via ORAL
  Filled 2023-03-27 (×6): qty 1

## 2023-03-27 MED ORDER — ACETAMINOPHEN 325 MG PO TABS
650.0000 mg | ORAL_TABLET | Freq: Four times a day (QID) | ORAL | Status: DC | PRN
  Administered 2023-03-27 – 2023-04-02 (×3): 650 mg via ORAL
  Filled 2023-03-27 (×3): qty 2

## 2023-03-27 MED ORDER — IPRATROPIUM-ALBUTEROL 0.5-2.5 (3) MG/3ML IN SOLN
3.0000 mL | RESPIRATORY_TRACT | Status: DC | PRN
  Administered 2023-03-27 – 2023-03-29 (×2): 3 mL via RESPIRATORY_TRACT
  Filled 2023-03-27 (×2): qty 3

## 2023-03-27 MED ORDER — MIRTAZAPINE 15 MG PO TABS
30.0000 mg | ORAL_TABLET | Freq: Every day | ORAL | Status: DC
  Administered 2023-03-27 – 2023-04-01 (×6): 30 mg via ORAL
  Filled 2023-03-27 (×6): qty 2

## 2023-03-27 MED ORDER — IPRATROPIUM-ALBUTEROL 0.5-2.5 (3) MG/3ML IN SOLN
3.0000 mL | Freq: Three times a day (TID) | RESPIRATORY_TRACT | Status: DC
  Administered 2023-03-27 – 2023-03-30 (×8): 3 mL via RESPIRATORY_TRACT
  Filled 2023-03-27 (×7): qty 3

## 2023-03-27 MED ORDER — ACETAMINOPHEN 650 MG RE SUPP: 650.0000 mg | Freq: Four times a day (QID) | RECTAL | Status: DC | PRN

## 2023-03-27 MED ORDER — INSULIN ASPART 100 UNIT/ML IJ SOLN
0.0000 [IU] | Freq: Three times a day (TID) | INTRAMUSCULAR | Status: DC
  Administered 2023-03-27: 2 [IU] via SUBCUTANEOUS
  Administered 2023-03-27: 3 [IU] via SUBCUTANEOUS
  Administered 2023-03-28 (×2): 1 [IU] via SUBCUTANEOUS
  Administered 2023-03-29: 2 [IU] via SUBCUTANEOUS
  Administered 2023-03-29: 1 [IU] via SUBCUTANEOUS
  Administered 2023-03-30: 2 [IU] via SUBCUTANEOUS
  Administered 2023-03-31 (×2): 1 [IU] via SUBCUTANEOUS
  Administered 2023-04-01: 2 [IU] via SUBCUTANEOUS
  Administered 2023-04-01: 1 [IU] via SUBCUTANEOUS
  Administered 2023-04-02: 2 [IU] via SUBCUTANEOUS
  Filled 2023-03-27 (×13): qty 1

## 2023-03-27 MED ORDER — VENLAFAXINE HCL ER 75 MG PO CP24
225.0000 mg | ORAL_CAPSULE | Freq: Every day | ORAL | Status: DC
  Administered 2023-03-27 – 2023-04-02 (×7): 225 mg via ORAL
  Filled 2023-03-27 (×7): qty 3

## 2023-03-27 MED ORDER — PANTOPRAZOLE SODIUM 40 MG PO TBEC
40.0000 mg | DELAYED_RELEASE_TABLET | Freq: Every day | ORAL | Status: DC
  Administered 2023-03-27 – 2023-04-02 (×7): 40 mg via ORAL
  Filled 2023-03-27 (×7): qty 1

## 2023-03-27 MED ORDER — GABAPENTIN 300 MG PO CAPS
300.0000 mg | ORAL_CAPSULE | Freq: Every day | ORAL | Status: DC
  Administered 2023-03-27 – 2023-04-01 (×6): 300 mg via ORAL
  Filled 2023-03-27 (×6): qty 1

## 2023-03-27 MED ORDER — MAGNESIUM HYDROXIDE 400 MG/5ML PO SUSP: 30.0000 mL | Freq: Every day | ORAL | Status: DC | PRN

## 2023-03-27 MED ORDER — SENNOSIDES-DOCUSATE SODIUM 8.6-50 MG PO TABS
2.0000 | ORAL_TABLET | Freq: Two times a day (BID) | ORAL | Status: DC
  Administered 2023-03-27 – 2023-04-02 (×10): 2 via ORAL
  Filled 2023-03-27 (×11): qty 2

## 2023-03-27 MED ORDER — FUROSEMIDE 10 MG/ML IJ SOLN
40.0000 mg | Freq: Two times a day (BID) | INTRAMUSCULAR | Status: DC
  Administered 2023-03-27 – 2023-04-01 (×10): 40 mg via INTRAVENOUS
  Filled 2023-03-27 (×10): qty 4

## 2023-03-27 MED ORDER — FERROUS GLUCONATE 324 (38 FE) MG PO TABS
324.0000 mg | ORAL_TABLET | Freq: Every day | ORAL | Status: DC
  Administered 2023-03-27 – 2023-04-02 (×7): 324 mg via ORAL
  Filled 2023-03-27 (×7): qty 1

## 2023-03-27 MED ORDER — ENOXAPARIN SODIUM 60 MG/0.6ML IJ SOSY
55.0000 mg | PREFILLED_SYRINGE | INTRAMUSCULAR | Status: DC
  Administered 2023-03-27 – 2023-04-02 (×7): 55 mg via SUBCUTANEOUS
  Filled 2023-03-27 (×7): qty 0.6

## 2023-03-27 MED ORDER — ALBUTEROL SULFATE HFA 108 (90 BASE) MCG/ACT IN AERS: 2.0000 | INHALATION_SPRAY | Freq: Four times a day (QID) | RESPIRATORY_TRACT | Status: DC | PRN

## 2023-03-27 MED ORDER — POTASSIUM CHLORIDE CRYS ER 20 MEQ PO TBCR
40.0000 meq | EXTENDED_RELEASE_TABLET | ORAL | Status: AC
  Administered 2023-03-27 (×2): 40 meq via ORAL
  Filled 2023-03-27 (×2): qty 2

## 2023-03-27 MED ORDER — IPRATROPIUM-ALBUTEROL 0.5-2.5 (3) MG/3ML IN SOLN
3.0000 mL | Freq: Four times a day (QID) | RESPIRATORY_TRACT | Status: DC
  Administered 2023-03-27 (×2): 3 mL via RESPIRATORY_TRACT
  Filled 2023-03-27 (×2): qty 3

## 2023-03-27 NOTE — H&P (Signed)
[name]   PATIENT NAME: Shane Chambers    MR#:  [id number]  DATE OF BIRTH:  September 16, 1961  DATE OF ADMISSION:  03/26/2023  PRIMARY CARE PHYSICIAN: [name], MD   Patient is coming from: Home  REQUESTING/REFERRING PHYSICIAN: Shaune Pollack, MD  CHIEF COMPLAINT:   Chief Complaint  Patient presents with   Shortness of Breath    HISTORY OF PRESENT ILLNESS: 2  Shane Chambers is a 62 y.o. male with medical history significant for coronary artery disease status post CABG, COPD, depression, type II diabetes mellitus, GERD, hypertension, dyslipidemia and hypothyroidism as well as ischemic cardiomyopathy and peripheral vascular disease, who presented to the emergency room with acute onset of worsening dyspnea with associated increasing cough and wheezing over the last several days.  Symptoms have significantly worsened today.  He was having difficult time catching his breath.  He was also noted to be hypoxic requiring 2 to 3 L of O2 by nasal cannula.  No fever or chills.  No nausea or vomiting or abdominal pain.  No chest pain or palpitations.  No dysuria, oliguria or hematuria or flank pain.  ED Course: When he came to the ER, respiratory to 24 and heart rate was 85 and later 95 with a BP of 170/72 and temperature 98.  Labs revealed VBG with pH 7.42 and HCO3 of 24.6 and CMP was remarkable for hypokalemia of 3.33 with creatinine 1.54 better than previous levels with albumin of 2.8 and BNP 891.3 with high sensitive troponin I 15.  Procalcitonin was less than 0.1 and CBC showed anemia with hemoglobin 9.4 hematocrit 29.4 above previous levels.  COVID-19 PCR came back negative.  UA showed more than 300 protein rare bacteria and 50 glucose.    EKG as reviewed by me : Normal sinus rhythm with a rate of 84 with probable left atrial enlargement and inferior Q waves Imaging: Portable chest x-ray showed cardiomegaly with central vascular congestion and probable small pleural effusions.  The  patient was given IV Lasix 40 mg, 2 g of IV magnesium sulfate, DuoNeb and albuterol nebulizer.  He will be admitted to a cardiac telemetry bed for further evaluation and management. PAST MEDICAL HISTORY:   Past Medical History:  Diagnosis Date   Anemia    Arthritis    CAD (coronary artery disease)    a. s/p CABG in 03/2020 with LIMA-LAD, RIMA-PL, RA-D1-RI-OM1   Cancer (HCC)    rectal   Cellulitis    COPD (chronic obstructive pulmonary disease) (HCC)    Depression    Diabetes mellitus without complication (HCC)    GERD (gastroesophageal reflux disease)    History of kidney stones    Hyperlipidemia 12/01/2019   Hypertension    Hypothyroidism    Ischemic cardiomyopathy    a. EF 20-25% by echo in 02/2020 b. at 40% by echo in 03/2020 c. EF normalized to 60-65% by echo in 04/2020   Myocardial infarction (HCC)    Peripheral vascular disease (HCC)    Sleep apnea    Type 2 diabetes mellitus (HCC)     PAST SURGICAL HISTORY:   Past Surgical History:  Procedure Laterality Date   AMPUTATION Right 10/05/2022   Procedure: AMPUTATION BELOW KNEE;  Surgeon: Nadara Mustard, MD;  Location: [facility] OR;  Service: Orthopedics;  Laterality: Right;   AMPUTATION Right 11/14/2022   Procedure: AMPUTATION BELOW KNEE REVISION; WASHOUT;  Surgeon: Renford Dills, MD;  Location: [facility] ORS;  Service: Vascular;  Laterality: Right;  AMPUTATION Right 11/18/2022   Procedure: AMPUTATION BELOW KNEE REVISION AND CLOSURE;  Surgeon: Renford Dills, MD;  Location: [facility] ORS;  Service: Vascular;  Laterality: Right;   APPLICATION OF WOUND VAC Right 10/05/2022   Procedure: APPLICATION OF WOUND VAC;  Surgeon: Nadara Mustard, MD;  Location: [facility] OR;  Service: Orthopedics;  Laterality: Right;   BIOPSY  07/18/2021   Procedure: BIOPSY;  Surgeon: Lanelle Bal, DO;  Location: [facility];  Service: Endoscopy;;   BIOPSY  01/09/2022   Procedure: BIOPSY;  Surgeon: Lanelle Bal, DO;  Location: [facility];  Service:  Endoscopy;;   COLONOSCOPY WITH PROPOFOL N/A 07/18/2021   Carver: 15 millimeter polyp removed from the sigmoid colon, 5 mm polyp removed from sigmoid colon.  Nonbleeding internal hemorrhoids.  Significant looping of the colon. sigmoid path showed invasive colonic adenocarcinoma involving tubular adenoma (invades to depth of 2mm, carcinoma 1mm from margin, no lymphovascular invasion, no poorly differentiated component.   CORONARY ARTERY BYPASS GRAFT N/A 03/13/2020   Procedure: CORONARY ARTERY BYPASS GRAFTING (CABG) times five using bilateral Internal mammary arteries and left radial artery;  Surgeon: Linden Dolin, MD;  Location: [facility] OR;  Service: Open Heart Surgery;  Laterality: N/A;   DENTAL SURGERY     ESOPHAGOGASTRODUODENOSCOPY (EGD) WITH PROPOFOL N/A 07/18/2021   Carver: 1 gastric polyp status post biopsy, gastritis. gastric bx with slight chronic inflammation and no H.pyori. GEJ polypectomy with mild inflammation only   FLEXIBLE SIGMOIDOSCOPY N/A 08/26/2021   Carver: Nonbleeding internal hemorrhoids.  15 mm ulcers from previous polypectomy found in the rectum.  No evidence of previous polyp.  Located 5 to 8 cm from anal verge.   FLEXIBLE SIGMOIDOSCOPY N/A 01/09/2022   Procedure: FLEXIBLE SIGMOIDOSCOPY;  Surgeon: Lanelle Bal, DO;  [facility];  Service: Endoscopy;  Laterality: N/A;   POLYPECTOMY  07/18/2021   Procedure: POLYPECTOMY INTESTINAL;  Surgeon: Lanelle Bal, DO;  [facility];  Service: Endoscopy;;   RADIAL ARTERY HARVEST Left 03/13/2020   Procedure: RADIAL ARTERY HARVEST,;  Surgeon: Linden Dolin, MD;  Location: [facility] OR;  Service: Open Heart Surgery;  Laterality: Left;   RIGHT/LEFT HEART CATH AND CORONARY ANGIOGRAPHY N/A 03/07/2020   Procedure: RIGHT/LEFT HEART CATH AND CORONARY ANGIOGRAPHY;  Surgeon: Tonny Bollman, MD;  Location: [facility] INVASIVE CV LAB;  Service: Cardiovascular;  Laterality: N/A;   STUMP REVISION Right 12/17/2022   Procedure: RIGHT  BELOW KNEE AMPUTATION REVISION;  Surgeon: Nadara Mustard, MD;  Location: [facility] OR;  Service: Orthopedics;  Laterality: Right;   SUBMUCOSAL LIFTING INJECTION  01/09/2022   Procedure: SUBMUCOSAL LIFTING INJECTION;  Surgeon: Lanelle Bal, DO;  [facility];  Service: Endoscopy;;   SUBMUCOSAL TATTOO INJECTION  01/09/2022   Procedure: SUBMUCOSAL TATTOO INJECTION;  Surgeon: Lanelle Bal, DO;  [facility];  Service: Endoscopy;;   TEE WITHOUT CARDIOVERSION N/A 03/13/2020   Procedure: TRANSESOPHAGEAL ECHOCARDIOGRAM (TEE);  Surgeon: Linden Dolin, MD;  Location[facility] OR;  Service: Open Heart Surgery;  Laterality: N/A;    SOCIAL HISTORY:   Social History   Tobacco Use   Smoking status: Former    Packs/day: 1.5    Types: Cigarettes    Quit date: 05/25/1995    Years since quitting: 27.8   Smokeless tobacco: Never  Substance Use Topics   Alcohol use: Not Currently    Comment: rarely    FAMILY HISTORY:   Family History  Problem Relation Age of Onset  Hypertension Mother     DRUG ALLERGIES:   Allergies  Allergen Reactions   Ace Inhibitors Swelling and Cough    (Not on MAR at [name])   Haloperidol And Related     Do NOT give anti-psychotics due to risk of  Torsades and QT prolongation (per Dr. Teena Irani request)   Other Itching    [name]   Shellfish Allergy     Listed on MAR   Penicillins Itching and Rash    Has patient had a PCN reaction causing immediate rash, facial/tongue/throat swelling, SOB or lightheadedness with hypotension: Unknown Has patient had a PCN reaction causing severe rash involving mucus membranes or skin necrosis: Unknown Has patient had a PCN reaction that required hospitalization: Unknown Has patient had a PCN reaction occurring within the last 10 years: Unknown If all of the above answers are "NO", then may proceed with Cephalosporin use.  Tolerated ancef (12-17-22)     REVIEW OF SYSTEMS:    ROS As per history of present illness. All pertinent systems were reviewed above. Constitutional, HEENT, cardiovascular, respiratory, GI, GU, musculoskeletal, neuro, psychiatric, endocrine, integumentary and hematologic systems were reviewed and are otherwise negative/unremarkable except for positive findings mentioned above in the HPI.   MEDICATIONS AT HOME:   Prior to Admission medications   Medication Sig Start Date End Date Taking? Authorizing Provider  acetaminophen (TYLENOL) 500 MG tablet Take 1,000 mg by mouth every 8 (eight) hours as needed for fever or moderate pain.    [provider]  albuterol (VENTOLIN HFA) 108 (90 Base) MCG/ACT inhaler Inhale 2 puffs into the lungs every 6 (six) hours as needed for wheezing or shortness of breath. 05/02/22   Johnson, Clanford L, MD  amLODipine (NORVASC) 10 MG tablet Take 1 tablet (10 mg total) by mouth daily. 10/18/22   Ghimire, Werner Lean, MD  aspirin 81 MG EC tablet Take 1 tablet (81 mg total) by mouth daily with breakfast. 11/29/20   Shon Hale, MD  atorvastatin (LIPITOR) 40 MG tablet Take 1 tablet (40 mg total) by mouth at bedtime. 11/28/22   Gillis Santa, MD  budesonide-formoterol [facility]) 160-4.5 MCG/ACT inhaler Inhale 2 puffs into the lungs in the morning and at bedtime. 05/24/20   Oretha Milch, MD  Cholecalciferol 125 MCG (5000 UT) TABS Take 1 tablet by mouth every Monday. Patient not taking: Reported on 02/17/2023    [provider]  ferrous gluconate (FERGON) 324 MG tablet Take 324 mg by mouth daily.    [provider]  gabapentin (NEURONTIN) 300 MG capsule Take 1 capsule (300 mg total) by mouth at bedtime. 02/19/23   Vassie Loll, MD  Glucerna [facility]) LIQD Take 1 Can by mouth with breakfast, with lunch, and with evening meal.    [provider]  insulin aspart (NOVOLOG FLEXPEN) 100 UNIT/ML FlexPen 0-15 Units, Subcutaneous, 3 times daily with meals CBG < 70: implement hypoglycemia  protocol-call MD CBG 70 - 120: 0 units CBG 121 - 150: 2 units CBG 151 - 200: 3 units CBG 201 - 250: 5 units CBG 251 - 300: 8 units CBG 301 - 350: 11 units CBG 351 - 400: 15 units CBG > 400: 10/17/22   Ghimire, Werner Lean, MD  ipratropium-albuterol (DUONEB) 0.5-2.5 (3) MG/3ML SOLN Take 3 mLs by nebulization every 6 (six) hours as needed. Patient taking differently: Take 3 mLs by nebulization every 6 (six) hours as needed (SOB/wheezing). 10/17/22   Ghimire, Werner Lean, MD  levothyroxine (SYNTHROID)  75 MCG tablet Take 75 mcg by mouth daily before breakfast.    [provider]  metoprolol tartrate (LOPRESSOR) 50 MG tablet Take 1 tablet (50 mg total) by mouth 2 (two) times daily. 11/28/22   Gillis Santa, MD  mirtazapine (REMERON) 30 MG tablet Take 30 mg by mouth at bedtime.    [provider]  Multiple Vitamin (MULTIVITAMIN) tablet Take 1 tablet by mouth daily.    [provider]  omeprazole (PRILOSEC) 40 MG capsule Take 40 mg by mouth daily. 01/01/22   [provider]  polyethylene glycol (MIRALAX / GLYCOLAX) 17 g packet Take 17 g by mouth daily as needed for moderate constipation.    [provider]  polyvinyl alcohol (LIQUIFILM TEARS) 1.4 % ophthalmic solution Place 1 drop into both eyes daily.    [provider]  tamsulosin (FLOMAX) 0.4 MG CAPS capsule Take 0.8 mg by mouth every evening.    [provider]  thiamine (VITAMIN B-1) 100 MG tablet Take 1 tablet (100 mg total) by mouth daily. 10/18/22   Ghimire, Werner Lean, MD  venlafaxine XR (EFFEXOR-XR) 150 MG 24 hr capsule Take 1 capsule (150 mg total) by mouth daily with breakfast. 11/29/22   Gillis Santa, MD      VITAL SIGNS:  Blood pressure (!) 170/72, pulse 83, temperature 98 F (36.7 C), resp. rate 16, weight 110 kg, SpO2 90 %.  PHYSICAL EXAMINATION:  Physical Exam  GENERAL:  62 y.o.-year-old patient lying in the bed with mild to moderate respiratory stress with conversational  dyspnea.  EYES: Pupils equal, round, reactive to light and accommodation. No scleral icterus. Extraocular muscles intact.  HEENT: Head atraumatic, normocephalic. Oropharynx and nasopharynx clear.  NECK:  Supple, no jugular venous distention. No thyroid enlargement, no tenderness.  LUNGS: Diminished bibasal breath sounds with bibasilar rales..  He had audible expiratory wheezes with diminished expiratory airflow and harsh vesicular breathing.  No use of accessory muscles of respiration.  CARDIOVASCULAR: Regular rate and rhythm, S1, S2 normal. No murmurs, rubs, or gallops.  ABDOMEN: Soft, nondistended, nontender. Bowel sounds present. No organomegaly or mass.  EXTREMITIES: No left pedal edema, cyanosis, or clubbing.  He has a right below-knee amputation NEUROLOGIC: Cranial nerves II through XII are intact. Muscle strength 5/5 in all extremities. Sensation intact. Gait not checked.  PSYCHIATRIC: The patient is alert and oriented x 3.  Normal affect and good eye contact. SKIN: No obvious rash, lesion, or ulcer.   LABORATORY PANEL:   CBC Recent Labs  Lab 03/26/23 2322  WBC 8.5  HGB 9.4*  HCT 29.4*  PLT 305   ------------------------------------------------------------------------------------------------------------------  Chemistries  Recent Labs  Lab 03/26/23 2322  NA 139  K 3.2*  CL 106  CO2 24  GLUCOSE 122*  BUN 33*  CREATININE 1.54*  CALCIUM 8.6*  MG 1.7  AST 28  ALT 24  ALKPHOS 124  BILITOT 0.7   ------------------------------------------------------------------------------------------------------------------  Cardiac Enzymes No results for input(s): "TROPONINI" in the last 168 hours. ------------------------------------------------------------------------------------------------------------------  RADIOLOGY:  DG Chest Portable 1 View  Result Date: 03/26/2023 CLINICAL DATA:  Shortness of breath EXAM: PORTABLE CHEST 1 VIEW COMPARISON:  02/16/2023 FINDINGS: Post  sternotomy changes. Cardiomegaly with central vascular congestion and probable small pleural effusions. No focal airspace disease or pneumothorax. IMPRESSION: Cardiomegaly with central vascular congestion and probable small pleural effusions. Electronically Signed   By: Jasmine Pang M.D.   On: 03/26/2023 23:52      IMPRESSION AND PLAN:  Assessment and Plan: * Acute  on chronic heart failure with preserved ejection fraction (HFpEF) (HCC)  -The patient will be admitted to a cardiac telemetry bed. - We will continue diuresis with IV Lasix. - We Will follow serial troponins. - We will follow I's and O's and daily weights. - Cardiology consult be obtained. - I notified Dr. [name] about the patient.   COPD with acute exacerbation (HCC) - We will place him on DuoNebs 4 times daily and every 4 hours as needed. - Mucolytic therapy will be provided. - Will place on IV Rocephin given severity of COPD exacerbation. - I will hold off on steroids at this time to avoid fluid retention given his acute CHF.  Acute respiratory failure with hypoxia (HCC) - This is clearly secondary to #1 and #2. - O2 protocol will be followed. - Management otherwise as above.  Hypothyroidism - We will continue Synthroid and check TSH level.  Type 2 diabetes mellitus without complications (HCC) - We will place him on supplemental coverage with NovoLog  Dyslipidemia -We will continue statin therapy.  GERD without esophagitis - We will continue PPI therapy.  BPH (benign prostatic hyperplasia) - We will continue Flomax.  Essential hypertension - We will continue his antihypertensives.   DVT prophylaxis: Lovenox. Advanced Care Planning:  Code Status: full code. Family Communication:  The plan of care was discussed in details with the patient (and family). I answered all questions. The patient agreed to proceed with the above mentioned plan. Further management will depend upon hospital course. Disposition  Plan: Back to previous home environment Consults called: Cardiology All the records are reviewed and case discussed with ED provider.  Status is: Inpatient   At the time of the admission, it appears that the appropriate admission status for this patient is inpatient.  This is judged to be reasonable and necessary in order to provide the required intensity of service to ensure the patient's safety given the presenting symptoms, physical exam findings and initial radiographic and laboratory data in the context of comorbid conditions.  The patient requires inpatient status due to high intensity of service, high risk of further deterioration and high frequency of surveillance required.  I certify that at the time of admission, it is my clinical judgment that the patient will require inpatient hospital care extending more than 2 midnights.                            Dispo: The patient is from: Home              Anticipated d/c is to: Home              Patient currently is not medically stable to d/c.              Difficult to place patient: No  Hannah Beat M.D on 03/27/2023 at 2:58 AM  [facility]   From 7 PM-7 AM, contact night-coverage [email or web address]  CC: Primary care physician; [name], MD

## 2023-03-27 NOTE — Assessment & Plan Note (Signed)
-   We will continue Flomax 

## 2023-03-27 NOTE — Assessment & Plan Note (Signed)
-   We will place him on supplemental coverage with NovoLog

## 2023-03-27 NOTE — Assessment & Plan Note (Signed)
-   We will place him on DuoNebs 4 times daily and every 4 hours as needed. - Mucolytic therapy will be provided. - Will place on IV Rocephin given severity of COPD exacerbation. - I will hold off on steroids at this time to avoid fluid retention given his acute CHF.

## 2023-03-27 NOTE — Assessment & Plan Note (Signed)
-   We will continue PPI therapy 

## 2023-03-27 NOTE — Evaluation (Signed)
Occupational Therapy Evaluation Patient Details Name: Shane Chambers MRN: [id number] DOB: 1961/06/23 Today's Date: 03/27/2023   History of Present Illness Shane Chambers is a 62 y.o. male with medical history significant for coronary artery disease status post CABG, COPD, depression, type II diabetes mellitus, R BKA, GERD, hypertension, dyslipidemia and hypothyroidism as well as ischemic cardiomyopathy and peripheral vascular disease, who presented to the emergency room with acute onset of worsening dyspnea with associated increasing cough and wheezing over the last several days.  Symptoms have significantly worsened today.  He was having difficult time catching his breath.  He was also noted to be hypoxic requiring 2 to 3 L of O2 by nasal cannula.  No fever or chills.  No nausea or vomiting or abdominal pain.  No chest pain or palpitations.  No dysuria, oliguria or hematuria or flank pain.   Clinical Impression   Shane Chambers presents with generalized weakness, limited endurance, and SOB. He comes from [facility], where he is a long-term resident. Pt is A&O x 4, appears fairly flat/down today (this therapist is acquainted with pt from previous visits) and admits he is discouraged by his ongoing health challenges. During today's session, pt is able to complete bed mobility without physical assistance and to maintain fair balance sitting EOB. He is able to engage in a range of UE therex in sitting for ~ 6 minutes before becoming SOB and needing to take a rest break. Pt maintains O2 sats at 96% or better on 3L O2. Pt can benefit from OT services during hospitalization; anticipate that he will be appropriate to return to [facility] post DC.   Recommendations for follow up therapy are one component of a multi-disciplinary discharge planning process, led by the attending physician.  Recommendations may be updated based on patient status, additional functional criteria and insurance  authorization.   Assistance Recommended at Discharge Frequent or constant Supervision/Assistance  Patient can return home with the following A little help with walking and/or transfers;A little help with bathing/dressing/bathroom;Assistance with cooking/housework;Assist for transportation;Help with stairs or ramp for entrance;Direct supervision/assist for medications management    Functional Status Assessment  Patient has had a recent decline in their functional status and demonstrates the ability to make significant improvements in function in a reasonable and predictable amount of time.  Equipment Recommendations  None recommended by OT    Recommendations for Other Services       Precautions / Restrictions Precautions Precautions: Fall Restrictions Weight Bearing Restrictions: No      Mobility Bed Mobility Overal bed mobility: Needs Assistance Bed Mobility: Rolling Rolling: Modified independent (Device/Increase time)         General bed mobility comments: Pt able to roll to L and R easily, w/o phyiscal assistance, using bed rails    Transfers Overall transfer level: Needs assistance   Transfers: Bed to chair/wheelchair/BSC            Lateral/Scoot Transfers: Mod assist        Balance Overall balance assessment: Needs assistance Sitting-balance support: Bilateral upper extremity supported Sitting balance-Leahy Scale: Fair         Standing balance comment: NT                           ADL either performed or assessed with clinical judgement   ADL  Vision Patient Visual Report: No change from baseline       Perception     Praxis      Pertinent Vitals/Pain Pain Assessment Pain Assessment: No/denies pain     Hand Dominance Right   Extremity/Trunk Assessment Upper Extremity Assessment Upper Extremity Assessment: Generalized weakness   Lower Extremity  Assessment Lower Extremity Assessment: RLE deficits/detail;Generalized weakness RLE Deficits / Details: BKA in fall 2023       Communication Communication Communication: No difficulties   Cognition Arousal/Alertness: Awake/alert Behavior During Therapy: WFL for tasks assessed/performed, Flat affect Overall Cognitive Status: Within Functional Limits for tasks assessed                                 General Comments: flat affect today, pt states he is disturbed re: having yet another health flair-up     General Comments  O2 sats remain in upper 90s on 3L O2 (pt does not use O2 at baseline)    Exercises Other Exercises Other Exercises: Educ re: energy conservation strategies, breathing techniques. UE therex in sitting.   Shoulder Instructions      Home Living Family/patient expects to be discharged to:: Skilled nursing facility                                 Additional Comments: Has been at the [facility] in [place]      Prior Functioning/Environment Prior Level of Function : Needs assist             Mobility Comments: Requires assistance for transfering bed<>chair 2/2 R BKA ADLs Comments: Requires assist for most BADL, all IADL        OT Problem List: Decreased strength;Decreased range of motion;Decreased activity tolerance;Impaired balance (sitting and/or standing);Cardiopulmonary status limiting activity      OT Treatment/Interventions: Self-care/ADL training;Patient/family education;Therapeutic exercise;Balance training;Energy conservation;Therapeutic activities    OT Goals(Current goals can be found in the care plan section) Acute Rehab OT Goals Patient Stated Goal: to feel better OT Goal Formulation: With patient Time For Goal Achievement: 04/10/23 Potential to Achieve Goals: Good ADL Goals Pt Will Transfer to Toilet: with supervision;bedside commode Pt/caregiver will Perform Home Exercise Program: Increased  ROM;Increased strength;Independently Additional ADL Goal #1: Pt will identify/demonstrate 2+ energy conservation strategies.  OT Frequency: Min 1X/week    Co-evaluation              AM-PAC OT "6 Clicks" Daily Activity     Outcome Measure Help from another person eating meals?: None Help from another person taking care of personal grooming?: A Little Help from another person toileting, which includes using toliet, bedpan, or urinal?: A Lot Help from another person bathing (including washing, rinsing, drying)?: A Lot Help from another person to put on and taking off regular upper body clothing?: A Little Help from another person to put on and taking off regular lower body clothing?: A Lot 6 Click Score: 16   End of Session Equipment Utilized During Treatment: Oxygen  Activity Tolerance: Patient tolerated treatment well Patient left: in bed;with call bell/phone within reach;with bed alarm set  OT Visit Diagnosis: Unsteadiness on feet (R26.81);Other abnormalities of gait and mobility (R26.89);Muscle weakness (generalized) (M62.81)                Time: 1610-9604 OT Time Calculation (min): 18 min Charges:  OT General Charges $OT Visit: 1  Visit OT Evaluation $OT Eval Low Complexity: 1 Low OT Treatments $Self Care/Home Management : 8-22 mins Latina Craver, PhD, MS, OTR/L 03/27/23, 2:22 PM

## 2023-03-27 NOTE — Consult Note (Signed)
Cardiology Consultation   Patient ID: Shane Chambers MRN: [id number]; DOB: 16-Mar-1961  Admit date: 03/26/2023 Date of Consult: 03/27/2023  PCP:  [name], MD   [facility] Providers Cardiologist: Dr. Wyline Mood Physician requesting consult: Dr. Chipper Herb Reason for consult" shortness of breath  Complaint chief  Shane Chambers is a 62 y.o. male with a hx of coronary artery disease CABG COPD depression diabetes type 2 GERD hypertension hyperlipidemia hypothyroidism PAD, moderate aortic valve stenosis presenting to the emergency room from nursing facility with worsening shortness of breath over the past week  History of Present Illness:   Shane Chambers reports developing shortness of breath starting last Sunday approximately 5 days ago.  Denies eating restaurant food, eats what ever the nursing facility served him.  Denies excessive fluid intake, likes [employer] but not drinking excessive volume.  Denies tachypalpitations concerning for arrhythmia, denies chest pain concerning for angina  Notes indicating hospitalization April 2024 with renal failure  Presenting to the emergency room from [facility], Denies significant leg swelling, no abdominal distention concerning for fluid retention In the ER receiving nasal cannula oxygen, steroids/Solu-Medrol, DuoNebs x 2, IV Lasix Chest x-ray with vascular congestion, small pleural effusions Hemoglobin 9.7 up from the 7 range in February Creatinine 1.54 down from 3.9 in April 2024  Hospital admission April 2024 at that time presenting with several months chronic diarrhea, vomiting, renal failure, ATN, bladder outlet obstruction, chronic obstructive uropathy did not require hemodialysis His torsemide 20 mg daily was held at discharge   Past Medical History:  Diagnosis Date   Anemia    Arthritis    CAD (coronary artery disease)    a. s/p CABG in 03/2020 with LIMA-LAD, RIMA-PL, RA-[id number]-OM1   Cancer (HCC)    rectal    Cellulitis    COPD (chronic obstructive pulmonary disease) (HCC)    Depression    Diabetes mellitus without complication (HCC)    GERD (gastroesophageal reflux disease)    History of kidney stones    Hyperlipidemia 12/01/2019   Hypertension    Hypothyroidism    Ischemic cardiomyopathy    a. EF 20-25% by echo in 02/2020 b. at 40% by echo in 03/2020 c. EF normalized to 60-65% by echo in 04/2020   Myocardial infarction [facility])    Peripheral vascular disease (HCC)    Sleep apnea    Type 2 diabetes mellitus (HCC)     Past Surgical History:  Procedure Laterality Date   AMPUTATION Right 10/05/2022   Procedure: AMPUTATION BELOW KNEE;  Surgeon: Nadara Mustard, MD;  Location: [facility] OR;  Service: Orthopedics;  Laterality: Right;   AMPUTATION Right 11/14/2022   Procedure: AMPUTATION BELOW KNEE REVISION; WASHOUT;  Surgeon: Renford Dills, MD;  Location: [facility] ORS;  Service: Vascular;  Laterality: Right;   AMPUTATION Right 11/18/2022   Procedure: AMPUTATION BELOW KNEE REVISION AND CLOSURE;  Surgeon: Renford Dills, MD;  Location: [facility] ORS;  Service: Vascular;  Laterality: Right;   APPLICATION OF WOUND VAC Right 10/05/2022   Procedure: APPLICATION OF WOUND VAC;  Surgeon: Nadara Mustard, MD;  Location: [facility] OR;  Service: Orthopedics;  Laterality: Right;   BIOPSY  07/18/2021   Procedure: BIOPSY;  Surgeon: Lanelle Bal, DO;  Location: [facility];  Service: Endoscopy;;   BIOPSY  01/09/2022   Procedure: BIOPSY;  Surgeon: Lanelle Bal, DO;  [facility];  Service: Endoscopy;;   COLONOSCOPY WITH PROPOFOL N/A 07/18/2021   Carver: 15 millimeter polyp removed  from the sigmoid colon, 5 mm polyp removed from sigmoid colon.  Nonbleeding internal hemorrhoids.  Significant looping of the colon. sigmoid path showed invasive colonic adenocarcinoma involving tubular adenoma (invades to depth of 2mm, carcinoma 1mm from margin, no lymphovascular invasion, no poorly differentiated component.    CORONARY ARTERY BYPASS GRAFT N/A 03/13/2020   Procedure: CORONARY ARTERY BYPASS GRAFTING (CABG) times five using bilateral Internal mammary arteries and left radial artery;  Surgeon: Linden Dolin, MD;  Location: [facility] OR;  Service: Open Heart Surgery;  Laterality: N/A;   DENTAL SURGERY     ESOPHAGOGASTRODUODENOSCOPY (EGD) WITH PROPOFOL N/A 07/18/2021   Carver: 1 gastric polyp status post biopsy, gastritis. gastric bx with slight chronic inflammation and no H.pyori. GEJ polypectomy with mild inflammation only   FLEXIBLE SIGMOIDOSCOPY N/A 08/26/2021   Carver: Nonbleeding internal hemorrhoids.  15 mm ulcers from previous polypectomy found in the rectum.  No evidence of previous polyp.  Located 5 to 8 cm from anal verge.   FLEXIBLE SIGMOIDOSCOPY N/A 01/09/2022   Procedure: FLEXIBLE SIGMOIDOSCOPY;  Surgeon: Lanelle Bal, DO;  [facility];  Service: Endoscopy;  Laterality: N/A;   POLYPECTOMY  07/18/2021   Procedure: POLYPECTOMY INTESTINAL;  Surgeon: Lanelle Bal, DO;  [facility];  Service: Endoscopy;;   RADIAL ARTERY HARVEST Left 03/13/2020   Procedure: RADIAL ARTERY HARVEST,;  Surgeon: Linden Dolin, MD;  Location: [facility] OR;  Service: Open Heart Surgery;  Laterality: Left;   RIGHT/LEFT HEART CATH AND CORONARY ANGIOGRAPHY N/A 03/07/2020   Procedure: RIGHT/LEFT HEART CATH AND CORONARY ANGIOGRAPHY;  Surgeon: Tonny Bollman, MD;  Location: [facility] INVASIVE CV LAB;  Service: Cardiovascular;  Laterality: N/A;   STUMP REVISION Right 12/17/2022   Procedure: RIGHT BELOW KNEE AMPUTATION REVISION;  Surgeon: Nadara Mustard, MD;  Location: [facility] OR;  Service: Orthopedics;  Laterality: Right;   SUBMUCOSAL LIFTING INJECTION  01/09/2022   Procedure: SUBMUCOSAL LIFTING INJECTION;  Surgeon: Lanelle Bal, DO;  [facility];  Service: Endoscopy;;   SUBMUCOSAL TATTOO INJECTION  01/09/2022   Procedure: SUBMUCOSAL TATTOO INJECTION;  Surgeon: Lanelle Bal, DO;  [facility];  Service: Endoscopy;;   TEE WITHOUT CARDIOVERSION N/A 03/13/2020   Procedure: TRANSESOPHAGEAL ECHOCARDIOGRAM (TEE);  Surgeon: Linden Dolin, MD;  Location: [facility] OR;  Service: Open Heart Surgery;  Laterality: N/A;     Home Medications:  Prior to Admission medications   Medication Sig Start Date End Date Taking? Authorizing Provider  acetaminophen (TYLENOL) 500 MG tablet Take 1,000 mg by mouth every 8 (eight) hours as needed for fever or moderate pain.   Yes [provider]  albuterol (VENTOLIN HFA) 108 (90 Base) MCG/ACT inhaler Inhale 2 puffs into the lungs every 6 (six) hours as needed for wheezing or shortness of breath. 05/02/22  Yes Johnson, Clanford L, MD  amLODipine (NORVASC) 10 MG tablet Take 1 tablet (10 mg total) by mouth daily. 10/18/22  Yes Ghimire, Werner Lean, MD  ascorbic acid (VITAMIN C) 500 MG tablet Take 500 mg by mouth 2 (two) times daily.   Yes [provider]  aspirin 81 MG EC tablet Take 1 tablet (81 mg total) by mouth daily with breakfast. 11/29/20  Yes Emokpae, Courage, MD  atorvastatin (LIPITOR) 40 MG tablet Take 1 tablet (40 mg total) by mouth at bedtime. 11/28/22  Yes Gillis Santa, MD  budesonide-formoterol [facility]) 160-4.5 MCG/ACT inhaler Inhale 2 puffs into the lungs in the morning and at bedtime. 05/24/20  Yes Vassie Loll,  Comer Locket, MD  Cholecalciferol 125 MCG (5000 UT) TABS Take 1 tablet by mouth every Monday.   Yes [provider]  ferrous gluconate (FERGON) 324 MG tablet Take 324 mg by mouth daily.   Yes [provider]  gabapentin (NEURONTIN) 300 MG capsule Take 1 capsule (300 mg total) by mouth at bedtime. 02/19/23  Yes Vassie Loll, MD  insulin aspart (NOVOLOG FLEXPEN) 100 UNIT/ML FlexPen 0-15 Units, Subcutaneous, 3 times daily with meals CBG < 70: implement hypoglycemia protocol-call MD CBG 70 - 120: 0 units CBG 121 - 150: 2 units CBG 151 - 200: 3 units CBG 201 - 250: 5 units CBG 251 - 300: 8 units CBG 301 - 350: 11  units CBG 351 - 400: 15 units CBG > 400: 10/17/22  Yes Ghimire, Werner Lean, MD  ipratropium-albuterol (DUONEB) 0.5-2.5 (3) MG/3ML SOLN Take 3 mLs by nebulization every 6 (six) hours as needed. Patient taking differently: Take 3 mLs by nebulization every 6 (six) hours as needed (SOB/wheezing). 10/17/22  Yes Ghimire, Werner Lean, MD  levothyroxine (SYNTHROID) 75 MCG tablet Take 75 mcg by mouth daily before breakfast.   Yes [provider]  metoprolol tartrate (LOPRESSOR) 50 MG tablet Take 1 tablet (50 mg total) by mouth 2 (two) times daily. 11/28/22  Yes Gillis Santa, MD  mirtazapine (REMERON) 30 MG tablet Take 30 mg by mouth at bedtime.   Yes [provider]  omeprazole (PRILOSEC) 40 MG capsule Take 40 mg by mouth in the morning. 01/01/22  Yes [provider]  polyethylene glycol (MIRALAX / GLYCOLAX) 17 g packet Take 17 g by mouth daily as needed for moderate constipation.   Yes [provider]  polyvinyl alcohol (LIQUIFILM TEARS) 1.4 % ophthalmic solution Place 1 drop into both eyes daily.   Yes [provider]  sodium bicarbonate 650 MG tablet Take 650 mg by mouth 2 (two) times daily.   Yes [provider]  tamsulosin (FLOMAX) 0.4 MG CAPS capsule Take 0.8 mg by mouth every evening.   Yes [provider]  thiamine (VITAMIN B-1) 100 MG tablet Take 1 tablet (100 mg total) by mouth daily. 10/18/22  Yes Ghimire, Werner Lean, MD  venlafaxine XR (EFFEXOR-XR) 75 MG 24 hr capsule Take 225 mg by mouth daily. 03/11/23  Yes [provider]  Vitamin D, Ergocalciferol, (DRISDOL) 1.25 MG (50000 UNIT) CAPS capsule Take 50,000 Units by mouth every 7 (seven) days. (Mondays)   Yes [provider]  Glucerna (GLUCERNA) LIQD Take 1 Can by mouth with breakfast, with lunch, and with evening meal.    [provider]  Multiple Vitamin (MULTIVITAMIN) tablet Take 1 tablet by mouth daily.    [provider]    Inpatient  Medications: Scheduled Meds:  amLODipine  10 mg Oral Daily   atorvastatin  40 mg Oral QHS   [START ON 03/30/2023] cholecalciferol  5,000 Units Oral Q Mon   enoxaparin (LOVENOX) injection  55 mg Subcutaneous Q24H   feeding supplement (GLUCERNA SHAKE)  237 mL Oral TID with meals   ferrous gluconate  324 mg Oral Daily   furosemide  40 mg Intravenous Q12H   gabapentin  300 mg Oral QHS   ipratropium-albuterol  3 mL Nebulization QID   levothyroxine  75 mcg Oral Q0600   metoprolol tartrate  50 mg Oral BID   mirtazapine  30 mg Oral QHS   multivitamin with minerals  1 tablet Oral Daily   pantoprazole  40 mg Oral Daily  potassium chloride  40 mEq Oral Q2H   tamsulosin  0.4 mg Oral QPM   thiamine  100 mg Oral Daily   venlafaxine XR  225 mg Oral Q breakfast   Continuous Infusions:  cefTRIAXone (ROCEPHIN)  IV Stopped (03/27/23 0448)   PRN Meds: acetaminophen **OR** acetaminophen, ipratropium-albuterol, magnesium hydroxide, polyvinyl alcohol, traZODone  Allergies:    Allergies  Allergen Reactions   Ace Inhibitors Swelling and Cough    (Not on MAR at [name])   Haloperidol And Related     Do NOT give anti-psychotics due to risk of  Torsades and QT prolongation (per Dr. Teena Irani request)   Other Itching    [name]   Shellfish Allergy     Listed on MAR   Penicillins Itching and Rash    Has patient had a PCN reaction causing immediate rash, facial/tongue/throat swelling, SOB or lightheadedness with hypotension: Unknown Has patient had a PCN reaction causing severe rash involving mucus membranes or skin necrosis: Unknown Has patient had a PCN reaction that required hospitalization: Unknown Has patient had a PCN reaction occurring within the last 10 years: Unknown If all of the above answers are "NO", then may proceed with Cephalosporin use.  Tolerated ancef (12-17-22)     Social History:   Social History   Socioeconomic History   Marital status:  Single    Spouse name: Not on file   Number of children: Not on file   Years of education: Not on file   Highest education level: Not on file  Occupational History   Not on file  Tobacco Use   Smoking status: Former    Packs/day: 1.5    Types: Cigarettes    Quit date: 05/25/1995    Years since quitting: 27.8   Smokeless tobacco: Never  Vaping Use   Vaping Use: Never used  Substance and Sexual Activity   Alcohol use: Not Currently    Comment: rarely   Drug use: No   Sexual activity: Not on file  Other Topics Concern   Not on file  Social History Narrative   Not on file   Social Determinants of Health   Financial Resource Strain: Not on file  Food Insecurity: No Food Insecurity (02/15/2023)   Hunger Vital Sign    Worried About Running Out of Food in the Last Year: Never true    Ran Out of Food in the Last Year: Never true  Transportation Needs: No Transportation Needs (02/15/2023)   PRAPARE - Administrator, Civil Service (Medical): No    Lack of Transportation (Non-Medical): No  Physical Activity: Not on file  Stress: Not on file  Social Connections: Not on file  Intimate Partner Violence: Not At Risk (02/15/2023)   Humiliation, Afraid, Rape, and Kick questionnaire    Fear of Current or Ex-Partner: No    Emotionally Abused: No    Physically Abused: No    Sexually Abused: No    Family History:    Family History  Problem Relation Age of Onset   Hypertension Mother      ROS:  Please see the history of present illness.  Review of Systems  Constitutional: Negative.   HENT: Negative.    Respiratory:  Positive for shortness of breath.   Cardiovascular: Negative.   Gastrointestinal: Negative.   Musculoskeletal: Negative.   Neurological: Negative.   Psychiatric/Behavioral: Negative.    All other systems reviewed and are negative.   Physical Exam/Data:  Vitals:   03/27/23 0525 03/27/23 0730 03/27/23 0800 03/27/23 0905  BP:  130/66 (!) 159/72   Pulse:  99 80 80   Resp: (!) 26 (!) 23 (!) 23   Temp:    98.2 F (36.8 C)  TempSrc:    Oral  SpO2: 97% 96% 96%   Weight:        Intake/Output Summary (Last 24 hours) at 03/27/2023 0928 Last data filed at 03/27/2023 0102 Gross per 24 hour  Intake 50 ml  Output --  Net 50 ml      03/26/2023   11:23 PM 02/20/2023    5:00 AM 02/19/2023    5:46 AM  Last 3 Weights  Weight (lbs) 242 lb 8.1 oz 220 lb 7.4 oz 225 lb 8.5 oz  Weight (kg) 110 kg 100 kg 102.3 kg     Body mass index is 35.81 kg/m.  General:  Well nourished, well developed, in no acute distress HEENT: normal Neck:  JVD 8+ Vascular: No carotid bruits; Distal pulses 2+ bilaterally Cardiac:  normal S1, S2; RRR; no murmur  Lungs:  clear to auscultation bilaterally, no wheezing, rhonchi or rales  Abd: soft, nontender, no hepatomegaly  Ext: no edema Musculoskeletal:  No deformities, BUE and BLE strength normal and equal Skin: warm and dry  Neuro:  CNs 2-12 intact, no focal abnormalities noted Psych:  Normal affect   EKG:  The EKG was personally reviewed and demonstrates:   Normal sinus rhythm rate 84 bpm consider old inferior MI, prolonged QTc 503  Telemetry:  Telemetry was personally reviewed and demonstrates:   Normal sinus rhythm  Relevant CV Studies:   Laboratory Data:  High Sensitivity Troponin:   Recent Labs  Lab 03/26/23 2322  TROPONINIHS 15     Chemistry Recent Labs  Lab 03/26/23 2322 03/27/23 0508  NA 139 142  K 3.2* 3.4*  CL 106 109  CO2 24 23  GLUCOSE 122* 216*  BUN 33* 37*  CREATININE 1.54* 1.60*  CALCIUM 8.6* 8.6*  MG 1.7  --   GFRNONAA 51* 49*  ANIONGAP 9 10    Recent Labs  Lab 03/26/23 2322  PROT 7.3  ALBUMIN 2.8*  AST 28  ALT 24  ALKPHOS 124  BILITOT 0.7   Lipids No results for input(s): "CHOL", "TRIG", "HDL", "LABVLDL", "LDLCALC", "CHOLHDL" in the last 168 hours.  Hematology Recent Labs  Lab 03/26/23 2322 03/27/23 0508  WBC 8.5 6.9  RBC 3.30* 3.47*  HGB 9.4* 9.7*  HCT 29.4*  30.7*  MCV 89.1 88.5  MCH 28.5 28.0  MCHC 32.0 31.6  RDW 13.6 13.6  PLT 305 312   Thyroid No results for input(s): "TSH", "FREET4" in the last 168 hours.  BNP Recent Labs  Lab 03/26/23 2322  BNP 891.3*    DDimer No results for input(s): "DDIMER" in the last 168 hours.   Radiology/Studies:  DG Chest Portable 1 View  Result Date: 03/26/2023 CLINICAL DATA:  Shortness of breath EXAM: PORTABLE CHEST 1 VIEW COMPARISON:  02/16/2023 FINDINGS: Post sternotomy changes. Cardiomegaly with central vascular congestion and probable small pleural effusions. No focal airspace disease or pneumothorax. IMPRESSION: Cardiomegaly with central vascular congestion and probable small pleural effusions. Electronically Signed   By: Jasmine Pang M.D.   On: 03/26/2023 23:52     Assessment and Plan:   Shortness of breath/acute on chronic diastolic CHF Likely multifactorial including dietary indiscretion, poorly controlled blood pressure, moderate aortic valve stenosis, anemia, torsemide held on recent discharge from hospital early  April 2024 -Agree with IV Lasix every 12 hours with close monitoring of renal function, potassium repletion -Strict blood pressure control -Will likely need to be discharged on torsemide 20 daily  2.  Essential hypertension As outpatient taking amlodipine 10 daily, metoprolol 50 twice daily Will transition him to carvedilol Would likely need additional blood pressure control, consider adding long-acting nitrates versus hydralazine  3.  Coronary artery disease with stable angina, history of CABG Minimally elevated troponin, denies anginal symptoms Normal ejection fraction on recent echocardiogram  -No plan for ischemic workup at this time Repeat troponin pending  4.  Moderate aortic valve stenosis Likely contributing to CHF symptoms above No intervention needed at this time, will need echo on annual basis  5.  Anemia Will defer workup to primary care Consider iron  studies  6.  Diabetes type 2 Prior history of poorly controlled diabetes, A1c 11.1 in January 2022 More recently better controlled, last A1c December 2023 was 6.1   Total encounter time more than 80 minutes  Greater than 50% was spent in counseling and coordination of care with the patient  For questions or updates, please contact [facility] Please consult [email or web address] for contact info under    Signed, Julien Nordmann, MD  03/27/2023 9:28 AM

## 2023-03-27 NOTE — Assessment & Plan Note (Signed)
-   This is clearly secondary to #1 and #2. - O2 protocol will be followed. - Management otherwise as above. 

## 2023-03-27 NOTE — Hospital Course (Signed)
Shane Chambers is a 62 y.o. male with medical history significant for coronary artery disease status post CABG, COPD, depression, type II diabetes mellitus, GERD, hypertension, dyslipidemia and hypothyroidism as well as ischemic cardiomyopathy and peripheral vascular disease s/p right BKA, who presented to the emergency room with acute onset of worsening dyspnea with associated increasing cough and wheezing over the last several days.  He has a mild elevation of BNP chest x-ray showed vascular congestion.  He was started on IV Lasix.  Patient also had some bronchospasm, started on bronchodilator.

## 2023-03-27 NOTE — Progress Notes (Signed)
PHARMACIST - PHYSICIAN COMMUNICATION  CONCERNING:  Enoxaparin (Lovenox) for DVT Prophylaxis    RECOMMENDATION: Patient was prescribed enoxaprin 40mg  q24 hours for VTE prophylaxis.   Filed Weights   03/26/23 2323  Weight: 110 kg (242 lb 8.1 oz)    Body mass index is 35.81 kg/m.  Estimated Creatinine Clearance: 62.3 mL/min (A) (by C-G formula based on SCr of 1.54 mg/dL (H)).   Based on [facility] policy patient is candidate for enoxaparin 0.5mg /kg TBW SQ every 24 hours based on BMI being >30.  DESCRIPTION: Pharmacy has adjusted enoxaparin dose per [facility] policy.  Patient is now receiving enoxaparin 0.5 mg/kg every 24 hours   Otelia Sergeant, PharmD, [facility] 03/27/2023 1:27 AM

## 2023-03-27 NOTE — Progress Notes (Signed)
Progress Note   Patient: Shane Chambers:[id number] DOB: Mar 28, 1961 DOA: 03/26/2023     0 DOS: the patient was seen and examined on 03/27/2023   Brief hospital course: Shane Chambers is a 62 y.o. male with medical history significant for coronary artery disease status post CABG, COPD, depression, type II diabetes mellitus, GERD, hypertension, dyslipidemia and hypothyroidism as well as ischemic cardiomyopathy and peripheral vascular disease s/p right BKA, who presented to the emergency room with acute onset of worsening dyspnea with associated increasing cough and wheezing over the last several days.  He has a mild elevation of BNP chest x-ray showed vascular congestion.  He was started on IV Lasix.  Patient also had some bronchospasm, started on bronchodilator.   Principal Problem:   Acute on chronic heart failure with preserved ejection fraction (HFpEF) (HCC) Active Problems:   COPD with acute exacerbation (HCC)   Acute respiratory failure with hypoxia (HCC)   Type 2 diabetes mellitus without complications (HCC)   Hypothyroidism   Essential hypertension   Hypokalemia   BPH (benign prostatic hyperplasia)   GERD without esophagitis   Morbid obesity (HCC)   Acquired hypothyroidism   Dyslipidemia   CKD stage 3a, GFR 45-59 ml/min (HCC)   Aortic valve stenosis   Acute urinary retention   Assessment and Plan: * Acute on chronic heart failure with preserved ejection fraction (HFpEF) (HCC) Moderate aortic stenosis.  Acute respiratory failure with hypoxia (HCC) Patient is still required 2 L oxygen, still short of breath with exertion.  Still volume overloaded with crackles in the bases, continue IV Lasix.  Appreciate cardiology consult  COPD with acute exacerbation (HCC) Continue bronchodilators, bronchospasm seems to be better.  BPH (benign prostatic hyperplasia) with urinary retention. Patient had a Foley catheter placed 2 months ago.  Continue Flomax.  Chronic kidney disease  stage IIIa. Hypokalemia. Replete potassium, continue to follow renal function.  Creatinine slightly worsened today.   Hypothyroidism Continue Synthroid.  Type 2 diabetes mellitus without complications (HCC) Continue current regimen with scheduled Premeal insulin and sliding scale insulin.  May need a long-acting insulin if glucose running high.  Dyslipidemia continue statin therapy.  GERD without esophagitis PPI.  Essential hypertension Continue home medicines.  Generalized weakness. Started PT/OT.     Subjective:  Patient still has some short of breath with minimal exertion, orthopnea.  Patient also has some constipation.  Physical Exam: Vitals:   03/27/23 0905 03/27/23 0949 03/27/23 1030 03/27/23 1216  BP:  (!) 180/75 (!) 152/74 (!) 155/71  Pulse:  90 90 74  Resp:   (!) 23 20  Temp: 98.2 F (36.8 C)   97.9 F (36.6 C)  TempSrc: Oral   Oral  SpO2:   97% 100%  Weight:    100.4 kg  Height:    5\' 9"  (1.753 m)   General exam: Appears calm and comfortable  Respiratory system: Crackles bilateral lower fields. Respiratory effort normal. Cardiovascular system: S1 & S2 heard, RRR.  Coarse systolic murmurs, r. No pedal edema. Gastrointestinal system: Abdomen is nondistended, soft and nontender. No organomegaly or masses felt. Normal bowel sounds heard. Central nervous system: Alert and oriented. No focal neurological deficits. Extremities: Right BKA. Skin: No rashes, lesions or ulcers Psychiatry: Judgement and insight appear normal. Mood & affect appropriate.    Data Reviewed:  Chest x-ray and lab results reviewed.  Family Communication: None  Disposition: Status is: Inpatient Remains inpatient appropriate because: Severity of disease, IV treatment.     Time spent: No  charge minutes  Author: Marrion Coy, MD 03/27/2023 12:59 PM  For on call review [email or web address].

## 2023-03-27 NOTE — Plan of Care (Signed)

## 2023-03-27 NOTE — Assessment & Plan Note (Signed)
-   We will continue Synthroid and check TSH level 

## 2023-03-27 NOTE — ED Provider Notes (Signed)
[facility] Provider Note    Event Date/Time   First MD Initiated Contact with Patient 03/26/23 2326     (approximate)   History   Shortness of Breath   HPI  Shane Chambers is a 62 y.o. male with history of hypertension, hyperlipidemia, diabetes, COPD, coronary disease, here with shortness of breath.  The patient has a history of recurrent bronchitis/COPD exacerbations.  He reports that he has had increasing cough and wheezing for the last several days.  He has been increasingly short of breath and this got acutely more severe today.  He felt like he could not catch his breath.  He was also hypoxic and placed on supplemental O2 prior to arrival.  He has had some occasional sputum production.  Denies known fevers.  No flank pain.  He said no nausea or vomiting.  No other complaints.     Physical Exam   Triage Vital Signs: ED Triage Vitals  Enc Vitals Group     BP 03/26/23 2326 (!) 170/72     Pulse Rate 03/26/23 2323 85     Resp 03/26/23 2323 (!) 24     Temp 03/26/23 2323 98 F (36.7 C)     Temp src --      SpO2 03/26/23 2323 90 %     Weight 03/26/23 2323 242 lb 8.1 oz (110 kg)     Height --      Head Circumference --      Peak Flow --      Pain Score --      Pain Loc --      Pain Edu? --      Excl. in GC? --     Most recent vital signs: Vitals:   03/26/23 2323 03/26/23 2326  BP:  (!) 170/72  Pulse: 85 83  Resp: (!) 24 16  Temp: 98 F (36.7 C)   SpO2: 90% 90%     General: Awake, no distress.  CV:  Good peripheral perfusion.  Systolic murmur appreciated. Resp:  Tachypneic with bilateral wheezes and bibasilar rhonchi. Abd:  No distention.  No tenderness. Other:  No significant edema.   ED Results / Procedures / Treatments   Labs (all labs ordered are listed, but only abnormal results are displayed) Labs Reviewed  CBC WITH DIFFERENTIAL/PLATELET - Abnormal; Notable for the following components:      Result Value   RBC 3.30 (*)     Hemoglobin 9.4 (*)    HCT 29.4 (*)    All other components within normal limits  COMPREHENSIVE METABOLIC PANEL - Abnormal; Notable for the following components:   Potassium 3.2 (*)    Glucose, Bld 122 (*)    BUN 33 (*)    Creatinine, Ser 1.54 (*)    Calcium 8.6 (*)    Albumin 2.8 (*)    GFR, Estimated 51 (*)    All other components within normal limits  BRAIN NATRIURETIC PEPTIDE - Abnormal; Notable for the following components:   B Natriuretic Peptide 891.3 (*)    All other components within normal limits  BLOOD GAS, VENOUS - Abnormal; Notable for the following components:   pCO2, Ven 38 (*)    pO2, Ven 58 (*)    All other components within normal limits  SARS CORONAVIRUS 2 BY RT PCR  MAGNESIUM  PROCALCITONIN  URINALYSIS, W/ REFLEX TO CULTURE (INFECTION SUSPECTED)  TROPONIN I (HIGH SENSITIVITY)     EKG Normal sinus rhythm, trickle rate 84.  PR 165, QRS 105, QTc 505.  No acute ST elevations repress.  Acute evidence of acute ischemia or infarct.  Prolonged QT interval.   RADIOLOGY Chest x-ray: Cardiomegaly with central vascular congestion and probable small bilateral pleural effusions   I also independently reviewed and agree with radiologist interpretations.   PROCEDURES:  Critical Care performed: No  .1-3 Lead EKG Interpretation  Performed by: Shaune Pollack, MD Authorized by: Shaune Pollack, MD     Interpretation: normal     ECG rate:  80-90   ECG rate assessment: normal     Rhythm: sinus rhythm     Ectopy: none     Conduction: normal   Comments:     Indication: SOB     MEDICATIONS ORDERED IN ED: Medications  magnesium sulfate IVPB 2 g 50 mL (2 g Intravenous New Bag/Given 03/27/23 0008)  furosemide (LASIX) injection 40 mg (has no administration in time range)  ipratropium-albuterol (DUONEB) 0.5-2.5 (3) MG/3ML nebulizer solution 3 mL (3 mLs Nebulization Given 03/27/23 0007)  albuterol (PROVENTIL) (2.5 MG/3ML) 0.083% nebulizer solution 2.5 mg (2.5 mg  Nebulization Given 03/27/23 0007)     IMPRESSION / MDM / ASSESSMENT AND PLAN / ED COURSE  I reviewed the triage vital signs and the nursing notes.                              Differential diagnosis includes, but is not limited to, COPD exacerbation, CAP, CHF, ACS, PE, anemia, pulmonary edema from AKI  Patient's presentation is most consistent with acute presentation with potential threat to life or bodily function.  The patient is on the cardiac monitor to evaluate for evidence of arrhythmia and/or significant heart rate changes  62 year old male here with diffuse wheezing, hypoxia, and increased work of breathing.  Suspect multifactorial acute hypoxic respiratory distress secondary to component of CHF as well as COPD exacerbation.  No evidence to suggest focal pneumonia.  Patient is afebrile.  White count normal.  Will start breathing treatments, IV diuretics, and admit to medicine for further management.  Patient updated and is in agreement with this plan.  Chest x-ray shows bilateral edema and some small effusions.  He has no major leukocytosis or evidence of sepsis.  Renal function is actually improved from baseline.   FINAL CLINICAL IMPRESSION(S) / ED DIAGNOSES   Final diagnoses:  Acute respiratory failure with hypoxia (HCC)     Rx / DC Orders   ED Discharge Orders     None        Note:  This document was prepared using Dragon voice recognition software and may include unintentional dictation errors.   Shaune Pollack, MD 03/27/23 [phone]

## 2023-03-27 NOTE — Assessment & Plan Note (Addendum)
-  The patient will be admitted to a cardiac telemetry bed. - We will continue diuresis with IV Lasix. - We Will follow serial troponins. - We will follow I's and O's and daily weights. - Cardiology consult be obtained. - I notified Dr. [name] about the patient.

## 2023-03-27 NOTE — Assessment & Plan Note (Signed)
-   We will continue statin therapy. 

## 2023-03-27 NOTE — Assessment & Plan Note (Signed)
-   We will continue his antihypertensives. 

## 2023-03-28 DIAGNOSIS — J9601 Acute respiratory failure with hypoxia: Secondary | ICD-10-CM | POA: Diagnosis not present

## 2023-03-28 DIAGNOSIS — E785 Hyperlipidemia, unspecified: Secondary | ICD-10-CM | POA: Diagnosis not present

## 2023-03-28 DIAGNOSIS — N1831 Chronic kidney disease, stage 3a: Secondary | ICD-10-CM

## 2023-03-28 DIAGNOSIS — I5033 Acute on chronic diastolic (congestive) heart failure: Secondary | ICD-10-CM | POA: Diagnosis not present

## 2023-03-28 DIAGNOSIS — I10 Essential (primary) hypertension: Secondary | ICD-10-CM | POA: Diagnosis not present

## 2023-03-28 DIAGNOSIS — J441 Chronic obstructive pulmonary disease with (acute) exacerbation: Secondary | ICD-10-CM | POA: Diagnosis not present

## 2023-03-28 LAB — BASIC METABOLIC PANEL
Anion gap: 7 (ref 5–15)
BUN: 52 mg/dL — ABNORMAL HIGH (ref 6–20)
CO2: 24 mmol/L (ref 22–32)
Calcium: 8.8 mg/dL — ABNORMAL LOW (ref 8.9–10.3)
Chloride: 111 mmol/L (ref 98–111)
Creatinine, Ser: 1.74 mg/dL — ABNORMAL HIGH (ref 0.61–1.24)
GFR, Estimated: 44 mL/min — ABNORMAL LOW (ref >60)
Glucose, Bld: 118 mg/dL — ABNORMAL HIGH (ref 70–99)
Potassium: 4.2 mmol/L (ref 3.5–5.1)
Sodium: 142 mmol/L (ref 135–145)

## 2023-03-28 LAB — CBC
HCT: 27.7 % — ABNORMAL LOW (ref 39.0–52.0)
Hemoglobin: 8.6 g/dL — ABNORMAL LOW (ref 13.0–17.0)
MCH: 27.6 pg (ref 26.0–34.0)
MCHC: 31 g/dL (ref 30.0–36.0)
MCV: 88.8 fL (ref 80.0–100.0)
Platelets: 324 10*3/uL (ref 150–400)
RBC: 3.12 MIL/uL — ABNORMAL LOW (ref 4.22–5.81)
RDW: 14 % (ref 11.5–15.5)
WBC: 11.7 10*3/uL — ABNORMAL HIGH (ref 4.0–10.5)
nRBC: 0 % (ref 0.0–0.2)

## 2023-03-28 LAB — MRSA NEXT GEN BY PCR, NASAL: MRSA by PCR Next Gen: DETECTED — AB

## 2023-03-28 LAB — MAGNESIUM: Magnesium: 2 mg/dL (ref 1.7–2.4)

## 2023-03-28 LAB — GLUCOSE, CAPILLARY
Glucose-Capillary: 90 mg/dL (ref 70–99)
Glucose-Capillary: 129 mg/dL — ABNORMAL HIGH (ref 70–99)
Glucose-Capillary: 129 mg/dL — ABNORMAL HIGH (ref 70–99)
Glucose-Capillary: 107 mg/dL — ABNORMAL HIGH (ref 70–99)

## 2023-03-28 MED ORDER — HYDRALAZINE HCL 25 MG PO TABS
25.0000 mg | ORAL_TABLET | Freq: Three times a day (TID) | ORAL | Status: DC
  Administered 2023-03-28 – 2023-03-29 (×3): 25 mg via ORAL
  Filled 2023-03-28 (×3): qty 1

## 2023-03-28 MED ORDER — LACTULOSE 10 GM/15ML PO SOLN
20.0000 g | Freq: Once | ORAL | Status: AC
  Administered 2023-03-28: 20 g via ORAL
  Filled 2023-03-28: qty 30

## 2023-03-28 MED ORDER — ALBUMIN HUMAN 25 % IV SOLN
25.0000 g | Freq: Once | INTRAVENOUS | Status: AC
  Administered 2023-03-28: 25 g via INTRAVENOUS
  Filled 2023-03-28: qty 100

## 2023-03-28 MED ORDER — SODIUM CHLORIDE 0.9 % IV SOLN
300.0000 mg | Freq: Once | INTRAVENOUS | Status: AC
  Administered 2023-03-28: 300 mg via INTRAVENOUS
  Filled 2023-03-28: qty 300

## 2023-03-28 NOTE — Progress Notes (Signed)
Rounding Note    Patient Name: Shane Chambers Date of Encounter: 03/28/2023  [facility] Cardiologist: Dina Rich, MD   Subjective   Feeling better.  Breathing remains labored.   Inpatient Medications    Scheduled Meds:  amLODipine  10 mg Oral Daily   atorvastatin  40 mg Oral QHS   carvedilol  25 mg Oral BID WC   Chlorhexidine Gluconate Cloth  6 each Topical Daily   [START ON 03/30/2023] cholecalciferol  5,000 Units Oral Q Mon   enoxaparin (LOVENOX) injection  55 mg Subcutaneous Q24H   feeding supplement (GLUCERNA SHAKE)  237 mL Oral TID with meals   ferrous gluconate  324 mg Oral Daily   furosemide  40 mg Intravenous Q12H   gabapentin  300 mg Oral QHS   insulin aspart  0-5 Units Subcutaneous QHS   insulin aspart  0-9 Units Subcutaneous TID WC   ipratropium-albuterol  3 mL Nebulization TID   levothyroxine  75 mcg Oral Q0600   mirtazapine  30 mg Oral QHS   multivitamin with minerals  1 tablet Oral Daily   pantoprazole  40 mg Oral Daily   senna-docusate  2 tablet Oral BID   tamsulosin  0.4 mg Oral QPM   thiamine  100 mg Oral Daily   venlafaxine XR  225 mg Oral Q breakfast   Continuous Infusions:  iron sucrose 300 mg (03/28/23 1214)   PRN Meds: acetaminophen **OR** acetaminophen, ipratropium-albuterol, magnesium hydroxide, polyvinyl alcohol, traZODone   Vital Signs    Vitals:   03/28/23 0343 03/28/23 0507 03/28/23 0734 03/28/23 0846  BP:  (!) 160/76  (!) 157/74  Pulse:  71  65  Resp:  19  18  Temp:  (!) 97.4 F (36.3 C)  98 F (36.7 C)  TempSrc:      SpO2:  100% 99% 100%  Weight: 101.2 kg     Height:        Intake/Output Summary (Last 24 hours) at 03/28/2023 1244 Last data filed at 03/28/2023 0900 Gross per 24 hour  Intake 240 ml  Output 1750 ml  Net -1510 ml      03/28/2023    3:43 AM 03/27/2023   12:16 PM 03/26/2023   11:23 PM  Last 3 Weights  Weight (lbs) 223 lb 1.7 oz 221 lb 5.5 oz 242 lb 8.1 oz  Weight (kg) 101.2 kg 100.4 kg  110 kg      Telemetry    Sinus rhythm.  No events.  - Personally Reviewed  ECG    N/a - Personally Reviewed  Physical Exam   VS:  BP (!) 157/74 (BP Location: Left Arm)   Pulse 65   Temp 98 F (36.7 C)   Resp 18   Ht 5\' 9"  (1.753 m)   Wt 101.2 kg   SpO2 100%   BMI 32.95 kg/m  , BMI Body mass index is 32.95 kg/m. GENERAL:  Chronically ill-appearing HEENT: Pupils equal round and reactive, fundi not visualized, oral mucosa unremarkable NECK:  + jugular venous distention, waveform within normal limits, carotid upstroke brisk and symmetric, no bruits, no thyromegaly LUNGS:  Clear to auscultation bilaterally HEART:  RRR.  PMI not displaced or sustained,S1 and S2 within normal limits, no S3, no S4, no clicks, no rubs, 3/6 systolic murmur at LUSB  ABD:  Flat, positive bowel sounds normal in frequency in pitch, no bruits, no rebound, no guarding, no midline pulsatile mass, no hepatomegaly, no splenomegaly EXT:  2 plus pulses  throughout, 2+ bilateral LE edema, no cyanosis no clubbing.  Thenar atrophy SKIN:  No rashes no nodules NEURO:  Cranial nerves II through XII grossly intact, motor grossly intact throughout PSYCH:  Cognitively intact, oriented to person place and time   Labs    High Sensitivity Troponin:   Recent Labs  Lab 03/26/23 2322 03/27/23 1002  TROPONINIHS 15 18*     Chemistry Recent Labs  Lab 03/26/23 2322 03/27/23 0508 03/28/23 0455  NA 139 142 142  K 3.2* 3.4* 4.2  CL 106 109 111  CO2 24 23 24   GLUCOSE 122* 216* 118*  BUN 33* 37* 52*  CREATININE 1.54* 1.60* 1.74*  CALCIUM 8.6* 8.6* 8.8*  MG 1.7  --  2.0  PROT 7.3  --   --   ALBUMIN 2.8*  --   --   AST 28  --   --   ALT 24  --   --   ALKPHOS 124  --   --   BILITOT 0.7  --   --   GFRNONAA 51* 49* 44*  ANIONGAP 9 10 7     Lipids No results for input(s): "CHOL", "TRIG", "HDL", "LABVLDL", "LDLCALC", "CHOLHDL" in the last 168 hours.  Hematology Recent Labs  Lab 03/26/23 2322 03/27/23 0508  03/28/23 0455  WBC 8.5 6.9 11.7*  RBC 3.30* 3.47* 3.12*  HGB 9.4* 9.7* 8.6*  HCT 29.4* 30.7* 27.7*  MCV 89.1 88.5 88.8  MCH 28.5 28.0 27.6  MCHC 32.0 31.6 31.0  RDW 13.6 13.6 14.0  PLT 305 312 324   Thyroid No results for input(s): "TSH", "FREET4" in the last 168 hours.  BNP Recent Labs  Lab 03/26/23 2322  BNP 891.3*    DDimer No results for input(s): "DDIMER" in the last 168 hours.   Radiology    DG Chest Portable 1 View  Result Date: 03/26/2023 CLINICAL DATA:  Shortness of breath EXAM: PORTABLE CHEST 1 VIEW COMPARISON:  02/16/2023 FINDINGS: Post sternotomy changes. Cardiomegaly with central vascular congestion and probable small pleural effusions. No focal airspace disease or pneumothorax. IMPRESSION: Cardiomegaly with central vascular congestion and probable small pleural effusions. Electronically Signed   By: Jasmine Pang M.D.   On: 03/26/2023 23:52    Cardiac Studies   Echo 02/16/23: 1. Left ventricular ejection fraction, by estimation, is 60 to 65%. The  left ventricle has normal function. Left ventricular endocardial border  not optimally defined to evaluate regional wall motion. There is moderate  left ventricular hypertrophy.   2. Right ventricular systolic function is normal. The right ventricular  size is normal.   3. The aortic valve has an indeterminant number of cusps. There is mild  calcification of the aortic valve. There is mild thickening of the aortic  valve. Aortic valve regurgitation is not visualized. Moderate aortic valve  stenosis. Aortic valve mean  gradient measures 20.7 mmHg. Aortic valve peak gradient measures 35.3  mmHg. Aortic valve area, by VTI measures 1.17 cm.      Patient Profile     62 y.o. male with CAD status post CABG, moderate aortic stenosis, diabetes, hypertension, hyperlipidemia, COPD, and GERD admitted with acute on chronic diastolic heart failure.  Assessment & Plan    # Acute on chronic diastolic heart failure: #  Hypertension:  Patient was recently admitted to the hospital and torsemide was held 02/2023.  At that time he had AKI in the setting of months of diarrhea and acute onset of vomiting.  He is now volume overloaded.  He is feeling better with diuresis but remains volume overloaded.  Continue with IV Lasix and monitor renal function closely.  Blood pressure is uncontrolled.  Given his recent renal dysfunction we will add hydralazine 25 mg every 8 hours.  Continue amlodipine, carvedilol, and consider an ARB once he is more euvolemic and does not require diuresis with IV Lasix.  Metoprolol was switched to carvedilol.   # Moderate aortic stenosis:  Continue outpatient monitoring.    # CAD s/p CABG:  # Hyperlipidemia:  No ischemic symptoms.  Continue aspirin, atorvastatin carvedilol and amlodipine.   # Anemia: Receiving IV iron now.      For questions or updates, please contact [facility] Please consult [email or web address] for contact info under        Signed, Chilton Si, MD  03/28/2023, 12:44 PM

## 2023-03-28 NOTE — Progress Notes (Signed)
Progress Note   Patient: Shane Chambers ZOX:[id number] DOB: 04-05-1961 DOA: 03/26/2023     1 DOS: the patient was seen and examined on 03/28/2023   Brief hospital course: Shane Chambers is a 62 y.o. male with medical history significant for coronary artery disease status post CABG, COPD, depression, type II diabetes mellitus, GERD, hypertension, dyslipidemia and hypothyroidism as well as ischemic cardiomyopathy and peripheral vascular disease s/p right BKA, who presented to the emergency room with acute onset of worsening dyspnea with associated increasing cough and wheezing over the last several days.  He has a mild elevation of BNP chest x-ray showed vascular congestion.  He was started on IV Lasix.  Patient also had some bronchospasm, started on bronchodilator.   Principal Problem:   Acute on chronic heart failure with preserved ejection fraction (HFpEF) (HCC) Active Problems:   COPD with acute exacerbation (HCC)   Acute respiratory failure with hypoxia (HCC)   Type 2 diabetes mellitus without complications (HCC)   Hypothyroidism   Essential hypertension   Hypokalemia   BPH (benign prostatic hyperplasia)   Iron deficiency anemia   GERD without esophagitis   Morbid obesity (HCC)   Acquired hypothyroidism   Dyslipidemia   CKD stage 3a, GFR 45-59 ml/min (HCC)   Moderate aortic stenosis   Acute urinary retention   Assessment and Plan:  Acute on chronic heart failure with preserved ejection fraction (HFpEF) (HCC) Moderate aortic stenosis.  Acute respiratory failure with hypoxia [facility]) Patient feels better today, but still has significant crackles in the bases, still has volume overload.  However, renal function is getting worse, discussed with cardiology, will continue IV Lasix, I will give 25 g albumin.   COPD with acute exacerbation (HCC) No additional bronchospasm.   BPH (benign prostatic hyperplasia) with urinary retention. Patient had a Foley catheter placed 2 months ago.   Continue Flomax.   Chronic kidney disease stage IIIa. Hypokalemia. Potassium normalized, renal function a little worse today, will continue to follow give albumin.   Hypothyroidism Continue Synthroid.   Type 2 diabetes mellitus without complications (HCC) Continue current regimen with scheduled Premeal insulin and sliding scale insulin.  Glucose is acceptable.  Continue to follow.  Iron deficiency anemia. Hemoglobin is lower today, will give IV iron.   Dyslipidemia continue statin therapy.   GERD without esophagitis PPI.   Essential hypertension Continue home medicines.   Generalized weakness. Started PT/OT.        Subjective:  Patient feels constipated, short of breath seem to be better.  No cough.  Physical Exam: Vitals:   03/28/23 0343 03/28/23 0507 03/28/23 0734 03/28/23 0846  BP:  (!) 160/76  (!) 157/74  Pulse:  71  65  Resp:  19  18  Temp:  (!) 97.4 F (36.3 C)  98 F (36.7 C)  TempSrc:      SpO2:  100% 99% 100%  Weight: 101.2 kg     Height:       General exam: Appears calm and comfortable  Respiratory system: Crackles in the bases bilaterally.Marland Kitchen Respiratory effort normal. Cardiovascular system: S1 & S2 heard, RRR.  2/6 coarse systolic murmur.  No pedal edema. Gastrointestinal system: Abdomen is nondistended, soft and nontender. No organomegaly or masses felt. Normal bowel sounds heard. Central nervous system: Alert and oriented. No focal neurological deficits. Extremities: Symmetric 5 x 5 power. Skin: No rashes, lesions or ulcers Psychiatry: Judgement and insight appear normal. Mood & affect appropriate.    Data Reviewed:  Lab results reviewed.  Family Communication: None  Disposition: Status is: Inpatient Remains inpatient appropriate because: Severity of disease, IV treatment.     Time spent: 35 minutes  Author: Marrion Coy, MD 03/28/2023 12:41 PM  For on call review [email or web address].

## 2023-03-28 NOTE — Evaluation (Signed)
Physical Therapy Evaluation Patient Details Name: Shane Chambers MRN: [id number] DOB: Nov 21, 1960 Today's Date: 03/28/2023  History of Present Illness  Shane Chambers is a 62 y.o. 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  Symptoms have significantly worsened today.  He was having difficult time catching his breath.  He was also noted to be hypoxic requiring 2 to 3 L of O2 by nasal cannula.  No fever or chills.  No nausea or vomiting or abdominal pain.  No chest pain or palpitations.  No dysuria, oliguria or hematuria or flank pain.   Clinical Impression  Patient received reclining in bed with eyes closed. Patient verbally responsive at times and also responsive to cuing with additional time/persistence. Patient appears to be more avoidant of participation than actually drowsy based on his responses to questions and requests that demonstrate his awareness of what is going on the room around him. He was able to provide a brief history. He has lived in a SNF for the past 2 years. Prior to hospitalization he needed help with bathing, lower extremity dressing, and using a slide board to transfer to a wheelchair that he uses for ambulation. He states he is able to do his own upper body dressing and sit up in bed. Upon PT evaluation, patient required significant cuing and encouragement to go supine to sit, but he was able to without physical support from PT. NT came in for vitals at that time and patient required Max A +2 to complete slide-board transfer from bed to chair. He required cuing to move his head forwards and  place his hands/body properly for the transfer. Patient appears to have potential for improved contribution to transfer. He appears to have experienced decline in functional independence and would benefit from PT to decrease caregiver burden. Patient would benefit from skilled physical therapy to address impairments and functional limitations (see PT Problem List below) to work towards stated goals and return to PLOF or maximal functional independence.      Recommendations for follow up therapy are one component of a multi-disciplinary discharge planning process, led by the attending physician.  Recommendations may be updated based on patient status, additional functional criteria and insurance authorization.  Follow Up Recommendations Can patient physically be transported by private vehicle: No     Assistance Recommended at Discharge Intermittent Supervision/Assistance  Patient can return home with the following  Assistance with cooking/housework;Two people to help with walking and/or transfers;A lot of help with bathing/dressing/bathroom;Help with stairs or ramp for entrance;Assist for transportation    Equipment Recommendations Other (comment) (to be determined in next venue of care)  Recommendations for Other Services  OT consult    Functional Status Assessment Patient has had a recent decline in their functional status and demonstrates the ability to make significant improvements in function in a reasonable and predictable amount of time.     Precautions / Restrictions Precautions Precautions: Fall Precaution Comments: R transtibial amputation Restrictions Weight Bearing Restrictions: No      Mobility  Bed Mobility Overal bed mobility: Modified Independent Bed Mobility: Supine to Sit     Supine to sit: Modified independent (Device/Increase time)     General bed  mobility comments: supine to sit with strong encouragement from PT    Transfers Overall transfer level: Needs  assistance Equipment used: Sliding board Transfers: Bed to chair/wheelchair/BSC            Lateral/Scoot Transfers: Max assist, +2 physical assistance General transfer comment: Patient completed slide board transfer from bed to chair towards right side of body with PT assisted by NT. Patient failed to lean forwards far enough to unweight buttocks and required cuing and re-placing of right hand to more appropriate place to prevent it from stopping his body from sliding.    Ambulation/Gait               General Gait Details: unble to ambulate  Stairs            Wheelchair Mobility    Modified Rankin (Stroke Patients Only)       Balance Overall balance assessment: Needs assistance Sitting-balance support: Bilateral upper extremity supported Sitting balance-Leahy Scale: Good Sitting balance - Comments: steady sitting edge of bed with B UE support,     Standing balance-Leahy Scale: Zero Standing balance comment: Patient unable to stand                             Pertinent Vitals/Pain Pain Assessment Pain Assessment: No/denies pain    Home Living Family/patient expects to be discharged to:: Skilled nursing facility                   Additional Comments: Has been at the [facility] in [place]    Prior Function Prior Level of Function : Needs assist             Mobility Comments: patient reports he usually can sit at the edge of his bed without assistance but needs assistance to use slideboard to transfer to w/c that uses for mobility. ADLs Comments: Reports he needs assist for LE dressing, bathing, IADLs.     Hand Dominance   Dominant Hand: Right    Extremity/Trunk Assessment   Upper Extremity Assessment Upper Extremity Assessment: Generalized weakness    Lower Extremity Assessment Lower Extremity Assessment: Generalized weakness RLE Deficits / Details: transtibial amputation in fall 2023       Communication    Communication: No difficulties  Cognition Arousal/Alertness: Lethargic Behavior During Therapy: WFL for tasks assessed/performed Overall Cognitive Status: No family/caregiver present to determine baseline cognitive functioning                                 General Comments: Patient remains with eyes closed the entire treatment. Seems drowsy, but does follow commands and reponds at times showing good awareness of events in the room.        General Comments General comments (skin integrity, edema, etc.): O2 sats remain in upper 90s on 1L/min O2    Exercises Other Exercises Other Exercises: educated patinet on role of PT in acute care setting and importance of participation   Assessment/Plan    PT Assessment Patient needs continued PT services  PT Problem List Decreased strength;Decreased mobility;Decreased activity tolerance;Decreased balance;Decreased knowledge of use of DME;Decreased range of motion;Obesity       PT Treatment Interventions DME instruction;Therapeutic exercise;Balance training;Stair training;Neuromuscular re-education;Therapeutic activities;Patient/family education;Wheelchair mobility training    PT Goals (Current goals can be found in the Care Plan section)  Acute Rehab PT Goals Patient Stated Goal: unclear;  patient does not express goals PT Goal Formulation: With patient    Frequency Min 2X/week     Co-evaluation               AM-PAC PT "6 Clicks" Mobility  Outcome Measure Help needed turning from your back to your side while in a flat bed without using bedrails?: None Help needed moving from lying on your back to sitting on the side of a flat bed without using bedrails?: None Help needed moving to and from a bed to a chair (including a wheelchair)?: A Lot Help needed standing up from a chair using your arms (e.g., wheelchair or bedside chair)?: Total Help needed to walk in hospital room?: Total Help needed climbing 3-5 steps with a  railing? : Total 6 Click Score: 13    End of Session Equipment Utilized During Treatment: Gait belt;Oxygen;Other (comment) (slide board) Activity Tolerance: Patient tolerated treatment well;Patient limited by lethargy Patient left: in chair;with chair alarm set;with nursing/sitter in room;with call bell/phone within reach Nurse Communication: Mobility status (possible need for lift equipment to get back to bed) PT Visit Diagnosis: Other abnormalities of gait and mobility (R26.89);Muscle weakness (generalized) (M62.81)    Time: 2956-2130 PT Time Calculation (min) (ACUTE ONLY): 30 min   Charges:   PT Evaluation $PT Eval Low Complexity: 1 Low          Sigismund Cross R. Ilsa Iha, PT, DPT 03/28/23, 4:43 PM

## 2023-03-29 ENCOUNTER — Inpatient Hospital Stay: Payer: Medicare Other

## 2023-03-29 DIAGNOSIS — I5033 Acute on chronic diastolic (congestive) heart failure: Secondary | ICD-10-CM | POA: Diagnosis not present

## 2023-03-29 DIAGNOSIS — J9601 Acute respiratory failure with hypoxia: Secondary | ICD-10-CM | POA: Diagnosis not present

## 2023-03-29 DIAGNOSIS — I350 Nonrheumatic aortic (valve) stenosis: Secondary | ICD-10-CM | POA: Diagnosis not present

## 2023-03-29 DIAGNOSIS — I10 Essential (primary) hypertension: Secondary | ICD-10-CM | POA: Diagnosis not present

## 2023-03-29 DIAGNOSIS — J441 Chronic obstructive pulmonary disease with (acute) exacerbation: Secondary | ICD-10-CM | POA: Diagnosis not present

## 2023-03-29 LAB — BASIC METABOLIC PANEL
Anion gap: 8 (ref 5–15)
BUN: 52 mg/dL — ABNORMAL HIGH (ref 6–20)
CO2: 24 mmol/L (ref 22–32)
Calcium: 9.2 mg/dL (ref 8.9–10.3)
Chloride: 108 mmol/L (ref 98–111)
Creatinine, Ser: 1.58 mg/dL — ABNORMAL HIGH (ref 0.61–1.24)
GFR, Estimated: 50 mL/min — ABNORMAL LOW (ref >60)
Glucose, Bld: 101 mg/dL — ABNORMAL HIGH (ref 70–99)
Potassium: 4.4 mmol/L (ref 3.5–5.1)
Sodium: 140 mmol/L (ref 135–145)

## 2023-03-29 LAB — CBC
HCT: 30.4 % — ABNORMAL LOW (ref 39.0–52.0)
Hemoglobin: 9.6 g/dL — ABNORMAL LOW (ref 13.0–17.0)
MCH: 28.1 pg (ref 26.0–34.0)
MCHC: 31.6 g/dL (ref 30.0–36.0)
MCV: 88.9 fL (ref 80.0–100.0)
Platelets: 376 10*3/uL (ref 150–400)
RBC: 3.42 MIL/uL — ABNORMAL LOW (ref 4.22–5.81)
RDW: 13.8 % (ref 11.5–15.5)
WBC: 13.2 10*3/uL — ABNORMAL HIGH (ref 4.0–10.5)
nRBC: 0 % (ref 0.0–0.2)

## 2023-03-29 LAB — GLUCOSE, CAPILLARY
Glucose-Capillary: 87 mg/dL (ref 70–99)
Glucose-Capillary: 137 mg/dL — ABNORMAL HIGH (ref 70–99)
Glucose-Capillary: 160 mg/dL — ABNORMAL HIGH (ref 70–99)
Glucose-Capillary: 148 mg/dL — ABNORMAL HIGH (ref 70–99)

## 2023-03-29 MED ORDER — GUAIFENESIN-DM 100-10 MG/5ML PO SYRP
5.0000 mL | ORAL_SOLUTION | ORAL | Status: DC | PRN
  Administered 2023-03-29 – 2023-04-02 (×8): 5 mL via ORAL
  Filled 2023-03-29 (×8): qty 10

## 2023-03-29 MED ORDER — HYDRALAZINE HCL 50 MG PO TABS
50.0000 mg | ORAL_TABLET | Freq: Three times a day (TID) | ORAL | Status: DC
  Administered 2023-03-29 – 2023-03-31 (×6): 50 mg via ORAL
  Filled 2023-03-29 (×6): qty 1

## 2023-03-29 MED ORDER — BENZONATATE 100 MG PO CAPS
100.0000 mg | ORAL_CAPSULE | Freq: Three times a day (TID) | ORAL | Status: DC | PRN
  Administered 2023-03-29 – 2023-04-02 (×5): 100 mg via ORAL
  Filled 2023-03-29 (×5): qty 1

## 2023-03-29 MED ORDER — GUAIFENESIN-DM 100-10 MG/5ML PO SYRP
5.0000 mL | ORAL_SOLUTION | ORAL | Status: DC | PRN
  Administered 2023-03-29: 5 mL via ORAL
  Filled 2023-03-29: qty 10

## 2023-03-29 NOTE — Progress Notes (Signed)
Progress Note   Patient: Shane Chambers WGN:[id number] DOB: 04-20-1961 DOA: 03/26/2023     2 DOS: the patient was seen and examined on 03/29/2023   Brief hospital course: Shane Chambers is a 62 y.o. male with medical history significant for coronary artery disease status post CABG, COPD, depression, type II diabetes mellitus, GERD, hypertension, dyslipidemia and hypothyroidism as well as ischemic cardiomyopathy and peripheral vascular disease s/p right BKA, who presented to the emergency room with acute onset of worsening dyspnea with associated increasing cough and wheezing over the last several days.  He has a mild elevation of BNP chest x-ray showed vascular congestion.  He was started on IV Lasix.  Patient also had some bronchospasm, started on bronchodilator.   Principal Problem:   Acute on chronic heart failure with preserved ejection fraction (HFpEF) (HCC) Active Problems:   COPD with acute exacerbation (HCC)   Acute respiratory failure with hypoxia (HCC)   Type 2 diabetes mellitus without complications (HCC)   Hypothyroidism   Essential hypertension   Hypokalemia   BPH (benign prostatic hyperplasia)   Iron deficiency anemia   GERD without esophagitis   Morbid obesity (HCC)   Acquired hypothyroidism   Dyslipidemia   CKD stage 3a, GFR 45-59 ml/min (HCC)   Moderate aortic stenosis   Acute urinary retention   Assessment and Plan:  Acute on chronic heart failure with preserved ejection fraction (HFpEF) (HCC) Moderate aortic stenosis.  Acute respiratory failure with hypoxia [facility]) Patient is seen by cardiology, on furosemide 40 mg twice a day IV.  Renal function was worse afterwards, received 25 g albumin.  Renal functions better today. Volume status seems to be better today, continue IV furosemide.  COPD with acute exacerbation (HCC) No additional bronchospasm.   BPH (benign prostatic hyperplasia) with urinary retention. Patient had a Foley catheter placed 2 months ago.   Continue Flomax.   Chronic kidney disease stage IIIa. Hypokalemia. Renal functions better after IV albumin.   Hypothyroidism Continue Synthroid.   Type 2 diabetes mellitus without complications (HCC) Glucose controlled.  Not on long-acting insulin.   Iron deficiency anemia. Received IV iron, hemoglobin is better today.   Dyslipidemia continue statin therapy.   GERD without esophagitis PPI.     Subjective:  Patient did not sleep last night, sleeping this morning.  But feel short of breath much improved.  Physical Exam: Vitals:   03/29/23 0726 03/29/23 0821 03/29/23 0824 03/29/23 0833  BP:  (!) 168/77 (!) 168/81 (!) 155/68  Pulse:  76 76 72  Resp:  16 16 19   Temp:  98.3 F (36.8 C) 98.3 F (36.8 C) 97.7 F (36.5 C)  TempSrc:  Oral Oral Oral  SpO2: 98%  92% 93%  Weight:      Height:       General exam: Appears calm and comfortable  Respiratory system: Coarse breathing sound, crackles better. Respiratory effort normal. Cardiovascular system: S1 & S2 heard, RRR. No JVD, murmurs, rubs, gallops or clicks. No pedal edema. Gastrointestinal system: Abdomen is nondistended, soft and nontender. No organomegaly or masses felt. Normal bowel sounds heard. Central nervous system: Drowsy and oriented x3. No focal neurological deficits. Extremities: Symmetric 5 x 5 power. Skin: No rashes, lesions or ulcers Psychiatry: Judgement and insight appear normal. Mood & affect appropriate.    Data Reviewed:  Lab results reviewed.  Family Communication: None  Disposition: Status is: Inpatient Remains inpatient appropriate because: Severity of disease, IV treatment.     Time spent: 35 minutes  Author: Marrion Coy, MD 03/29/2023 10:51 AM  For on call review [email or web address].

## 2023-03-29 NOTE — Progress Notes (Signed)
Spoke with Dr. Duke Salvia via secure chat and made MD aware that patient reports mucinex didn't really help his cough. MD gave order for Tessalon pearls 100 mg PO, PRN TID cough.

## 2023-03-30 ENCOUNTER — Other Ambulatory Visit (HOSPITAL_COMMUNITY): Payer: Self-pay

## 2023-03-30 ENCOUNTER — Telehealth (HOSPITAL_COMMUNITY): Payer: Self-pay

## 2023-03-30 ENCOUNTER — Encounter (HOSPITAL_COMMUNITY): Payer: Self-pay | Admitting: Hematology

## 2023-03-30 DIAGNOSIS — J9601 Acute respiratory failure with hypoxia: Secondary | ICD-10-CM | POA: Diagnosis not present

## 2023-03-30 DIAGNOSIS — I5033 Acute on chronic diastolic (congestive) heart failure: Secondary | ICD-10-CM | POA: Diagnosis not present

## 2023-03-30 DIAGNOSIS — J441 Chronic obstructive pulmonary disease with (acute) exacerbation: Secondary | ICD-10-CM | POA: Diagnosis not present

## 2023-03-30 LAB — BASIC METABOLIC PANEL
Anion gap: 11 (ref 5–15)
BUN: 52 mg/dL — ABNORMAL HIGH (ref 6–20)
CO2: 25 mmol/L (ref 22–32)
Calcium: 8.9 mg/dL (ref 8.9–10.3)
Chloride: 104 mmol/L (ref 98–111)
Creatinine, Ser: 1.57 mg/dL — ABNORMAL HIGH (ref 0.61–1.24)
GFR, Estimated: 50 mL/min — ABNORMAL LOW (ref >60)
Glucose, Bld: 101 mg/dL — ABNORMAL HIGH (ref 70–99)
Potassium: 4 mmol/L (ref 3.5–5.1)
Sodium: 140 mmol/L (ref 135–145)

## 2023-03-30 LAB — MAGNESIUM: Magnesium: 1.7 mg/dL (ref 1.7–2.4)

## 2023-03-30 LAB — GLUCOSE, CAPILLARY
Glucose-Capillary: 96 mg/dL (ref 70–99)
Glucose-Capillary: 166 mg/dL — ABNORMAL HIGH (ref 70–99)
Glucose-Capillary: 79 mg/dL (ref 70–99)
Glucose-Capillary: 191 mg/dL — ABNORMAL HIGH (ref 70–99)

## 2023-03-30 NOTE — Progress Notes (Signed)
Physical Therapy Treatment Patient Details Name: Shane Chambers MRN: [id number] DOB: 03/27/1961 Today's Date: 03/30/2023   History of Present Illness Shane Chambers is a 62 y.o. 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  Symptoms have significantly worsened today.  He was having difficult time catching his breath.  He was also noted to be hypoxic requiring 2 to 3 L of O2 by nasal cannula.  No fever or chills.  No nausea or vomiting or abdominal pain.  No chest pain or palpitations.  No dysuria, oliguria or hematuria or flank pain.    PT Comments    Pt was pleasant and motivated to participate during the session and put forth good effort throughout. Pt was able to tolerate manual resistance to several exercises per below with no adverse symptoms and was able to perform sup to/from sit with extra time and effort but without physical assistance.  Pt declined transfer training this session secondary to mild general fatigue and frequent coughing spells.  Pt will benefit from continued PT services upon discharge to safely address deficits listed in patient problem list for decreased caregiver assistance and eventual return to PLOF.     Recommendations for follow up therapy are one component of a multi-disciplinary discharge planning process, led by the attending physician.  Recommendations may be updated based on patient status, additional functional criteria and insurance authorization.  Follow Up Recommendations  Can patient physically be transported by private vehicle: No    Assistance Recommended at  Discharge Intermittent Supervision/Assistance  Patient can return home with the following Assistance with cooking/housework;Two people to help with walking and/or transfers;A lot of help with bathing/dressing/bathroom;Help with stairs or ramp for entrance;Assist for transportation   Equipment Recommendations  Other (comment) (TBD at next venue of care)    Recommendations for Other Services       Precautions / Restrictions Precautions Precautions: Fall Precaution Comments: R transtibial amputation Restrictions Weight Bearing Restrictions: No     Mobility  Bed Mobility Overal bed mobility: Modified Independent             General bed mobility comments: Min extra time and effort with sup to/from sit    Transfers                   General transfer comment: Pt declined transfer training this session    Ambulation/Gait                   Stairs             Wheelchair Mobility    Modified Rankin (Stroke Patients Only)       Balance Overall balance assessment: Needs assistance Sitting-balance support: Bilateral upper extremity supported, Feet unsupported Sitting balance-Leahy Scale: Good                                      Cognition Arousal/Alertness: Lethargic, Awake/alert Behavior During Therapy: WFL for tasks assessed/performed Overall Cognitive Status: Within Functional Limits for tasks assessed  Exercises Total Joint Exercises Ankle Circles/Pumps: Strengthening, Left, 5 reps, 10 reps (with manual resistance) Quad Sets: AROM, Strengthening, Both, 5 reps, 10 reps Gluteal Sets: Strengthening, Both, 5 reps, 10 reps Heel Slides: 5 reps, Both, Strengthening Hip ABduction/ADduction: Strengthening, Both, 10 reps (with manual resistance) Straight Leg Raises: Strengthening, Both, 10 reps Bridges: Strengthening, Both, 10 reps (R knee over bolster, min amplitude) Other  Exercises Other Exercises: Positioning education to promote R knee ext PROM Other Exercises: HEP education for LLE APs and BLE QS and GS x 10 each every 1-2 hours daily    General Comments        Pertinent Vitals/Pain Pain Assessment Pain Assessment: No/denies pain    Home Living                          Prior Function            PT Goals (current goals can now be found in the care plan section) Progress towards PT goals: Progressing toward goals    Frequency    Min 2X/week      PT Plan Current plan remains appropriate    Co-evaluation              AM-PAC PT "6 Clicks" Mobility   Outcome Measure  Help needed turning from your back to your side while in a flat bed without using bedrails?: None Help needed moving from lying on your back to sitting on the side of a flat bed without using bedrails?: None Help needed moving to and from a bed to a chair (including a wheelchair)?: A Lot Help needed standing up from a chair using your arms (e.g., wheelchair or bedside chair)?: Total Help needed to walk in hospital room?: Total Help needed climbing 3-5 steps with a railing? : Total 6 Click Score: 13    End of Session   Activity Tolerance: Patient tolerated treatment well Patient left: in bed;with call bell/phone within reach;with bed alarm set Nurse Communication: Mobility status PT Visit Diagnosis: Other abnormalities of gait and mobility (R26.89);Muscle weakness (generalized) (M62.81)     Time: 6962-9528 PT Time Calculation (min) (ACUTE ONLY): 25 min  Charges:  $Therapeutic Exercise: 8-22 mins $Therapeutic Activity: 8-22 mins                     D. Scott Calyb Mcquarrie PT, DPT 03/30/23, 3:10 PM

## 2023-03-30 NOTE — Progress Notes (Signed)
Pt's cough is much improved today.

## 2023-03-30 NOTE — Telephone Encounter (Signed)
Pharmacy Patient Advocate Encounter  Insurance verification completed.    The patient is insured through [employer]   The patient is currently admitted and ran test claims for the following: Jardiance, Farxiga.  Copays and coinsurance results were relayed to Inpatient clinical team.  

## 2023-03-30 NOTE — [place] FL2 (Signed)
[place] MEDICAID FL2 LEVEL OF CARE FORM     IDENTIFICATION  Patient Name: Shane Chambers Birthdate: [birth year] Sex: male Admission Date (Current Location): 03/26/2023  [facility] and [place] Number:  Chiropodist and Address:  [facility], [address], [place], [place] [address]      Provider Number: [id number]  Attending Physician Name and Address:  Marrion Coy, MD  Relative Name and Phone Number:  MIGUELITO, SCHNAIBLE (Mother) [phone] [facility] Phone)    Current Level of Care: Hospital Recommended Level of Care: Skilled Nursing Facility Prior Approval Number:    Date Approved/Denied:   PASRR Number: [id number]  Discharge Plan: SNF    Current Diagnoses: Patient Active Problem List   Diagnosis Date Noted   Acute on chronic heart failure with preserved ejection fraction (HFpEF) (HCC) 03/27/2023   COPD with acute exacerbation (HCC) 03/27/2023   Dyslipidemia 03/27/2023   Type 2 diabetes mellitus without complications (HCC) 03/27/2023   Hypothyroidism 03/27/2023   CKD stage 3a, GFR 45-59 ml/min (HCC) 03/27/2023   Moderate aortic stenosis 03/27/2023   Acute urinary retention 03/27/2023   Hyperkalemia 02/15/2023   Pulmonary edema 02/15/2023   Bilateral pleural effusion 02/15/2023   Nausea & vomiting 02/15/2023   Chronic diarrhea 02/15/2023   Obesity (BMI 30-39.9) 02/15/2023   High anion gap metabolic acidosis 02/14/2023   S/P BKA (below knee amputation) unilateral, right (HCC) 12/17/2022   MRSA infection 11/19/2022   Amputation stump infection (HCC) 11/19/2022   QT prolongation 11/10/2022   Severe recurrent major depression without psychotic features (HCC) 11/05/2022   Major depressive disorder with psychotic features (HCC) 11/02/2022   Acute hypoactive delirium due to another medical condition 10/13/2022   Septic arthritis of right ankle (HCC) 10/04/2022   Acute osteomyelitis (HCC) 10/02/2022   Subacute osteomyelitis, right ankle and  foot (HCC) 10/02/2022   Acquired hypothyroidism 10/02/2022   Hypoalbuminemia due to protein-calorie malnutrition (HCC) 10/02/2022   Cellulitis of right lower extremity 09/25/2022   Lactic acidosis 04/23/2022   Anemia of chronic disease 04/23/2022   Morbid obesity (HCC) 04/23/2022   AKI (acute kidney injury) (HCC) 04/22/2022   Elevated troponin 04/22/2022   Cancer of sigmoid colon (HCC) 12/16/2021   Constipation 12/16/2021   GERD without esophagitis 12/16/2021   Iron deficiency anemia 09/17/2021   Rectal cancer (HCC) 09/03/2021   Bilateral lower leg cellulitis 11/22/2020   Systolic and diastolic CHF, chronic (HCC) 11/22/2020   CHF exacerbation (HCC) 08/01/2020   Visit for wound check 05/28/2020   OSA (obstructive sleep apnea) 05/24/2020   COPD (chronic obstructive pulmonary disease) (HCC) 04/29/2020   Leukocytosis 04/29/2020   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   Ischemic cardiomyopathy    Coronary artery disease    Acute on chronic combined systolic and diastolic CHF (congestive heart failure) (HCC) 03/05/2020   Acute hyponatremia 03/05/2020   Acute respiratory failure with hypoxia (HCC) 12/21/2019   Severe Vitamin D deficiency 12/02/2019   Hypokalemia 12/01/2019   Mixed hyperlipidemia 12/01/2019   BPH (benign prostatic hyperplasia) 12/01/2019   Normocytic anemia 12/01/2019   Pneumonia due to COVID-19 virus 12/01/2019   Hypoxia    SOB (shortness of breath) 11/16/2019   Severe sepsis (HCC) 11/16/2019   Essential hypertension    Uncontrolled type 2 diabetes mellitus with hypoglycemia, with long-term current use of insulin (HCC)    Depression    Acute bronchitis with bronchospasm 11/15/2019    Orientation RESPIRATION BLADDER Height & Weight     Self, Time, Situation, Place  O2 (  1 liter) External catheter Weight: 96 kg Height:  5\' 9"  (175.3 cm)  BEHAVIORAL SYMPTOMS/MOOD NEUROLOGICAL BOWEL NUTRITION STATUS  Other (Comment) (n/a)  (n/a) Continent Diet (2 gram)  AMBULATORY STATUS  COMMUNICATION OF NEEDS Skin   Limited Assist Verbally Normal (Dry, Flaky)                       Personal Care Assistance Level of Assistance  Bathing, Dressing Bathing Assistance: Limited assistance   Dressing Assistance: Limited assistance     Functional Limitations Info             SPECIAL CARE FACTORS FREQUENCY  PT (By licensed PT)     PT Frequency: Min 2x weekly              Contractures Contractures Info: Not present    Additional Factors Info  Code Status, Allergies Code Status Info: FULL Allergies Info: Ace Inhibitors, Haloperidol And Related, Other, Shellfish Allergy, Penicillins           Current Medications (03/30/2023):  This is the current hospital active medication list Current Facility-Administered Medications  Medication Dose Route Frequency Provider Last Rate Last Admin   acetaminophen (TYLENOL) tablet 650 mg  650 mg Oral Q6H PRN Mansy, Jan A, MD   650 mg at 03/27/23 2001   Or   acetaminophen (TYLENOL) suppository 650 mg  650 mg Rectal Q6H PRN Mansy, Jan A, MD       amLODipine (NORVASC) tablet 10 mg  10 mg Oral Daily Mansy, Jan A, MD   10 mg at 03/30/23 1027   atorvastatin (LIPITOR) tablet 40 mg  40 mg Oral QHS Mansy, Jan A, MD   40 mg at 03/29/23 2159   benzonatate (TESSALON) capsule 100 mg  100 mg Oral TID PRN Chilton Si, MD   100 mg at 03/29/23 1815   carvedilol (COREG) tablet 25 mg  25 mg Oral BID WC Antonieta Iba, MD   25 mg at 03/30/23 1028   Chlorhexidine Gluconate Cloth 2 % PADS 6 each  6 each Topical Daily Mansy, Jan A, MD   6 each at 03/29/23 1002   cholecalciferol (VITAMIN D3) 25 MCG (1000 UNIT) tablet 5,000 Units  5,000 Units Oral Q Mon Mansy, Jan A, MD   5,000 Units at 03/30/23 1027   enoxaparin (LOVENOX) injection 55 mg  55 mg Subcutaneous Q24H Mansy, Jan A, MD   55 mg at 03/30/23 1028   feeding supplement (GLUCERNA SHAKE) (GLUCERNA SHAKE) liquid 237 mL  237 mL Oral TID with meals Mansy, Jan A, MD   237 mL at 03/29/23  1725   ferrous gluconate (FERGON) tablet 324 mg  324 mg Oral Daily Mansy, Jan A, MD   324 mg at 03/30/23 1027   furosemide (LASIX) injection 40 mg  40 mg Intravenous Q12H Marrion Coy, MD   40 mg at 03/30/23 1027   gabapentin (NEURONTIN) capsule 300 mg  300 mg Oral QHS Mansy, Jan A, MD   300 mg at 03/29/23 2159   guaiFENesin-dextromethorphan (ROBITUSSIN DM) 100-10 MG/5ML syrup 5 mL  5 mL Oral Q4H PRN Chilton Si, MD   5 mL at 03/30/23 1027   hydrALAZINE (APRESOLINE) tablet 50 mg  50 mg Oral Q8H Chilton Si, MD   50 mg at 03/30/23 0537   insulin aspart (novoLOG) injection 0-5 Units  0-5 Units Subcutaneous QHS Mansy, Jan A, MD       insulin aspart (novoLOG) injection 0-9 Units  0-9 Units  Subcutaneous TID WC Mansy, Jan A, MD   2 Units at 03/29/23 1727   ipratropium-albuterol (DUONEB) 0.5-2.5 (3) MG/3ML nebulizer solution 3 mL  3 mL Nebulization Q4H PRN Otelia Sergeant, RPH   3 mL at 03/29/23 1157   levothyroxine (SYNTHROID) tablet 75 mcg  75 mcg Oral Q0600 Mansy, Jan A, MD   75 mcg at 03/30/23 0535   magnesium hydroxide (MILK OF MAGNESIA) suspension 30 mL  30 mL Oral Daily PRN Mansy, Jan A, MD       mirtazapine (REMERON) tablet 30 mg  30 mg Oral QHS Mansy, Jan A, MD   30 mg at 03/29/23 2201   multivitamin with minerals tablet 1 tablet  1 tablet Oral Daily Mansy, Jan A, MD   1 tablet at 03/30/23 1027   pantoprazole (PROTONIX) EC tablet 40 mg  40 mg Oral Daily Mansy, Jan A, MD   40 mg at 03/30/23 1028   polyvinyl alcohol (LIQUIFILM TEARS) 1.4 % ophthalmic solution 1 drop  1 drop Both Eyes PRN Mansy, Jan A, MD       senna-docusate (Senokot-S) tablet 2 tablet  2 tablet Oral BID Marrion Coy, MD   2 tablet at 03/30/23 1027   tamsulosin (FLOMAX) capsule 0.4 mg  0.4 mg Oral QPM Mansy, Jan A, MD   0.4 mg at 03/29/23 1725   thiamine (VITAMIN B1) tablet 100 mg  100 mg Oral Daily Mansy, Jan A, MD   100 mg at 03/30/23 1028   traZODone (DESYREL) tablet 25 mg  25 mg Oral QHS PRN Mansy, Jan A, MD   25 mg  at 03/29/23 2206   venlafaxine XR (EFFEXOR-XR) 24 hr capsule 225 mg  225 mg Oral Q breakfast Mansy, Jan A, MD   225 mg at 03/30/23 1027     Discharge Medications: Please see discharge summary for a list of discharge medications.  Relevant Imaging Results:  Relevant Lab Results:   Additional Information SSN: [id number]  Truddie Hidden, RN

## 2023-03-30 NOTE — Progress Notes (Signed)
Rounding Note    Patient Name: Shane Chambers Date of Encounter: 03/30/2023  [facility] Cardiologist: Dina Rich, MD   Subjective   Patient seen on a.m. rounds.  Positive for continued coughing.  Notes limited sputum production today.  Denies any chest pain or worsening shortness of breath.  -3.8 L output in the last 24 hours with stable serum creatinine levels.  Inpatient Medications    Scheduled Meds:  amLODipine  10 mg Oral Daily   atorvastatin  40 mg Oral QHS   carvedilol  25 mg Oral BID WC   Chlorhexidine Gluconate Cloth  6 each Topical Daily   cholecalciferol  5,000 Units Oral Q Mon   enoxaparin (LOVENOX) injection  55 mg Subcutaneous Q24H   feeding supplement (GLUCERNA SHAKE)  237 mL Oral TID with meals   ferrous gluconate  324 mg Oral Daily   furosemide  40 mg Intravenous Q12H   gabapentin  300 mg Oral QHS   hydrALAZINE  50 mg Oral Q8H   insulin aspart  0-5 Units Subcutaneous QHS   insulin aspart  0-9 Units Subcutaneous TID WC   ipratropium-albuterol  3 mL Nebulization TID   levothyroxine  75 mcg Oral Q0600   mirtazapine  30 mg Oral QHS   multivitamin with minerals  1 tablet Oral Daily   pantoprazole  40 mg Oral Daily   senna-docusate  2 tablet Oral BID   tamsulosin  0.4 mg Oral QPM   thiamine  100 mg Oral Daily   venlafaxine XR  225 mg Oral Q breakfast   Continuous Infusions:  PRN Meds: acetaminophen **OR** acetaminophen, benzonatate, guaiFENesin-dextromethorphan, ipratropium-albuterol, magnesium hydroxide, polyvinyl alcohol, traZODone   Vital Signs    Vitals:   03/29/23 2018 03/30/23 0042 03/30/23 0337 03/30/23 0536  BP:  (!) 144/64 (!) 157/72 (!) 155/67  Pulse:  73 77 80  Resp:  18 18   Temp:  97.8 F (36.6 C) 98.1 F (36.7 C)   TempSrc:  Oral Oral   SpO2: 97% 98% 96% 94%  Weight:      Height:        Intake/Output Summary (Last 24 hours) at 03/30/2023 0735 Last data filed at 03/30/2023 0340 Gross per 24 hour  Intake 240 ml   Output 4100 ml  Net -3860 ml      03/29/2023    7:22 AM 03/28/2023    3:43 AM 03/27/2023   12:16 PM  Last 3 Weights  Weight (lbs) 214 lb 8.1 oz 223 lb 1.7 oz 221 lb 5.5 oz  Weight (kg) 97.3 kg 101.2 kg 100.4 kg      Telemetry    Sinus rhythm rate 70-80- Personally Reviewed  ECG    No new tracings- Personally Reviewed  Physical Exam   GEN: Ill-appearing Neck: + JVD Cardiac: RRR, III/VI systolic murmur, without rubs, or gallops.  Respiratory: Coarse with inspiratory and expiratory wheezing throughout to auscultation bilaterally.  Continues to maintain oxygen saturations on room air GI: Soft, nontender, non-distended  MS: 1+ edema LLE Right BKA; No deformity. Neuro:  Nonfocal  Psych: Normal affect   Labs    High Sensitivity Troponin:   Recent Labs  Lab 03/26/23 2322 03/27/23 1002  TROPONINIHS 15 18*     Chemistry Recent Labs  Lab 03/26/23 2322 03/27/23 0508 03/28/23 0455 03/29/23 0431 03/30/23 0527  NA 139   < > 142 140 140  K 3.2*   < > 4.2 4.4 4.0  CL 106   < >  111 108 104  CO2 24   < > 24 24 25   GLUCOSE 122*   < > 118* 101* 101*  BUN 33*   < > 52* 52* 52*  CREATININE 1.54*   < > 1.74* 1.58* 1.57*  CALCIUM 8.6*   < > 8.8* 9.2 8.9  MG 1.7  --  2.0  --  1.7  PROT 7.3  --   --   --   --   ALBUMIN 2.8*  --   --   --   --   AST 28  --   --   --   --   ALT 24  --   --   --   --   ALKPHOS 124  --   --   --   --   BILITOT 0.7  --   --   --   --   GFRNONAA 51*   < > 44* 50* 50*  ANIONGAP 9   < > 7 8 11    < > = values in this interval not displayed.    Lipids No results for input(s): "CHOL", "TRIG", "HDL", "LABVLDL", "LDLCALC", "CHOLHDL" in the last 168 hours.  Hematology Recent Labs  Lab 03/27/23 0508 03/28/23 0455 03/29/23 0431  WBC 6.9 11.7* 13.2*  RBC 3.47* 3.12* 3.42*  HGB 9.7* 8.6* 9.6*  HCT 30.7* 27.7* 30.4*  MCV 88.5 88.8 88.9  MCH 28.0 27.6 28.1  MCHC 31.6 31.0 31.6  RDW 13.6 14.0 13.8  PLT 312 324 376   Thyroid No results for  input(s): "TSH", "FREET4" in the last 168 hours.  BNP Recent Labs  Lab 03/26/23 2322  BNP 891.3*    DDimer No results for input(s): "DDIMER" in the last 168 hours.   Radiology    DG Chest Port 1 View  Result Date: 03/29/2023 CLINICAL DATA:  Cough EXAM: PORTABLE CHEST 1 VIEW COMPARISON:  03/26/2023 FINDINGS: Single frontal view of the chest demonstrates postsurgical changes from median sternotomy. The cardiac silhouette is enlarged. Chronic elevation of the right hemidiaphragm. Chronic central vascular congestion without airspace disease, effusion, or pneumothorax. IMPRESSION: 1. Chronic central vascular congestion.  No overt edema. 2. No acute airspace disease. Electronically Signed   By: Sharlet Salina M.D.   On: 03/29/2023 15:55    Cardiac Studies   Echo 02/16/23: 1. Left ventricular ejection fraction, by estimation, is 60 to 65%. The  left ventricle has normal function. Left ventricular endocardial border  not optimally defined to evaluate regional wall motion. There is moderate  left ventricular hypertrophy.   2. Right ventricular systolic function is normal. The right ventricular  size is normal.   3. The aortic valve has an indeterminant number of cusps. There is mild  calcification of the aortic valve. There is mild thickening of the aortic  valve. Aortic valve regurgitation is not visualized. Moderate aortic valve  stenosis. Aortic valve mean  gradient measures 20.7 mmHg. Aortic valve peak gradient measures 35.3  mmHg. Aortic valve area, by VTI measures 1.17 cm.   Patient Profile     62 y.o. male with a past medical history of CAD status post CABG, moderate aortic stenosis, diabetes, hypertension, hyperlipidemia, COPD, and gastroesophageal reflux disease, who is being seen and evaluated for acute on chronic diastolic congestive heart failure.  Assessment & Plan    Acute on chronic diastolic congestive heart failure -Patient was recently admitted to the hospital and  torsemide was held 02/2023 at the time he had AKI -he presented  to the emergency department volume overloaded - -3.8 L output in the last 24 hours -He is continued on furosemide 40 mg IV twice daily, carvedilol 25 mg twice daily -Diuresing well with stable renal function -LVEF of 60-65% -Can consider starting ARB once he is more euvolemic for better blood pressure control -Not a candidate for MRA's due to history of hyperkalemia -Not a good candidate for SGLT2 inhibitor with chronic Foley catheter placed approximately 2 months ago -Daily weights, I's and O's, low-sodium diet  Coronary artery disease status post CABG -No ischemic symptoms -Continue aspirin, atorvastatin, and carvedilol -High-sensitivity troponins trended flat to 15 and 18 -Continue with telemetry monitoring -EKG as needed for pain or changes -No further ischemic workup needed at this time  Hypertension -Blood pressure 156/73 -Continued on amlodipine 10 mg daily, carvedilol 25 mg twice daily, furosemide, hydralazine 50 mg every 8 hours -With changing from metoprolol to carvedilol and increasing hydralazine to 50 mg every 8 hours patient's blood pressure is starting to trend back to baseline -Vital signs per unit protocol  Hyperlipidemia -Continued on statin therapy  Moderate aortic stenosis -Recent echocardiogram revealed aortic valve mean gradient measures 20.7 mmHg, aortic valve peak gradient measured 35.3 mmHg, aortic valve area by VTI measures 1.17 cm -Possibly contributing to his heart failure symptoms -Continue with outpatient monitoring  Cough -Less productive -Continued on as needed breathing treatments and cough medications -White count continuing to climb 13.2 this morning -Chest x-ray completed yesterday showed chronic central vascular congestion with no overt edema and no acute airspace disease  Anemia -Hemoglobin 9.6 -Improved after iron infusion -Iron was 16 approximately 1 month ago -Continue  to be managed by IM  CKD stage IIIa -Serum creatinine 1.57 -Remains stable -His slightly improved with diuresis -Has chronic Foley catheter in place -Continue to monitor urine output -Monitor/trend/replete electrolytes as needed -Daily BMP -Avoid nephrotoxic agents were able  9.   Type 2 diabetes -Continued on insulin therapy -Management per IM     For questions or updates, please contact [facility] Please consult [email or web address] for contact info under        Signed, Marykatherine Sherwood, NP  03/30/2023, 7:35 AM

## 2023-03-30 NOTE — TOC Progression Note (Signed)
Transition of Care [facility]) - Progression Note    Patient Details  Name: Shane Chambers MRN: [id number] Date of Birth: Sep 05, 1961  [facility] of [facility] CM/SW Contact  Truddie Hidden, RN Phone Number: 03/30/2023, 1:43 PM  Clinical Narrative:    Sherron Monday with Jill Side from [facility] to update patient would likely require  IV diuresing per cardiology.      Barriers to Discharge: Barriers Resolved  Expected Discharge Plan and Services                                               Social Determinants of Health (SDOH) Interventions SDOH Screenings   Food Insecurity: No Food Insecurity (03/27/2023)  Housing: Low Risk  (03/27/2023)  Transportation Needs: No Transportation Needs (03/27/2023)  Utilities: Not At Risk (03/27/2023)  Alcohol Screen: Low Risk  (11/05/2022)  Tobacco Use: Medium Risk (03/27/2023)    Readmission Risk Interventions    02/19/2023    1:52 PM 02/15/2023   11:15 AM 11/11/2022    2:05 PM  Readmission Risk Prevention Plan  Transportation Screening Complete Complete   Medication Review Oceanographer) Complete Complete   PCP or Specialist appointment within 3-5 days of discharge Complete    HRI or Home Care Consult Complete Complete   SW Recovery Care/Counseling Consult Complete Complete   Palliative Care Screening Not Applicable Not Applicable   Skilled Nursing Facility Complete Complete      Information is confidential and restricted. Go to Review Flowsheets to unlock data.

## 2023-03-30 NOTE — Consult Note (Signed)
   Heart Failure Nurse Navigator Note  HFpEF 60- 65%.  Left ventricular hypertrophy.  Moderate aortic valve stenosis.  He presented to the emergency room from a care facility with complaints of worsening shortness of breath.  He had also noted increasing cough and wheezing.  Pressure was 170/72.  BNP 891.  X-ray with cardiomegaly and central vascular congestion.  Comorbidities:  Coronary artery disease/CABG COPD Depression Type 2 diabetes GERD Hypertension Hyperlipidemia Hypothyroidism  Medications:  Amlodipine 10 mg daily Atorvastatin 40 mg daily Carvedilol 25 mg 2 times a day with meals Furosemide 40 mg IV every 12 Hydralazine 50 mg every 8 hours Levothyroxine 75 mcg daily  Labs:  Sodium 140, potassium 4.0, chloride 104, CO2 25, BUN 52, creatinine 1.57, estimated GFR 50. Weight is 96 kg Intake 240 mL Output 4100 mL   Initial meeting with patient, was lying in bed, still continues to have cough.  States that he resides in a care facility and that they do try to weigh him daily.  He does admit to using some salt at the table but also uses Mrs. Dash.  Explained the reasoning behind abstaining from salt and foods that are higher in sodium content.  He voices understanding.  Also discussed fluid restriction of no more than 64 ounces and what constitutes a liquid.  He states that he is compliant with his medications as the nurses at the facility are the ones that give him his medications.  Stressed follow-up in the outpatient heart failure clinic for which she has new patient appointment on May 30 at 9:30 in the morning.  He has a 5% history of no-show which is 7 out of 135 appointments.  He was given the living with heart failure teaching booklet, magnet, info on heart failure and low-sodium along with weight chart.  He had no further questions.  Tresa Endo RN [facility]

## 2023-03-30 NOTE — Progress Notes (Signed)
Patient requested to have his "purse" under lock and key.  Security called, items inventoried and taken by the security team.

## 2023-03-30 NOTE — Care Management Important Message (Signed)
Important Message  Patient Details  Name: Shane Chambers MRN: [id number] Date of Birth: Nov 14, 1960   Medicare Important Message Given:  Yes  Patient asleep upon time of visit, no family in room.  Copy of Medicare IM left in room for reference.   [name] 03/30/2023, 11:36 AM

## 2023-03-30 NOTE — Discharge Instructions (Signed)

## 2023-03-30 NOTE — TOC Benefit Eligibility Note (Signed)
Patient Product/process development scientist completed.    The patient is currently admitted and upon discharge could be taking Jardiance.  The current 30 day co-pay is $0.00.    The patient is currently admitted and upon discharge could be taking [place]  The current 30 day co-pay is $0.00.    The patient is insured through [facility]   This test claim was processed through [facility]- copay amounts may vary at other pharmacies due to pharmacy/plan contracts, or as the patient moves through the different stages of their insurance plan.

## 2023-03-30 NOTE — Evaluation (Signed)
Clinical/Bedside Swallow Evaluation Patient Details  Name: Shane Chambers MRN: [id number] Date of Birth: 1961/05/28  Today's Date: 03/30/2023 Time: SLP Start Time (ACUTE ONLY): 1142 SLP Stop Time (ACUTE ONLY): 1152 SLP Time Calculation (min) (ACUTE ONLY): 10 min  Past Medical History:  Past Medical History:  Diagnosis Date   Anemia    Arthritis    CAD (coronary artery disease)    a. s/p CABG in 03/2020 with LIMA-LAD, RIMA-PL, RA-D1-RI-OM1   Cancer (HCC)    rectal   Cellulitis    COPD (chronic obstructive pulmonary disease) (HCC)    Depression    Diabetes mellitus without complication (HCC)    GERD (gastroesophageal reflux disease)    History of kidney stones    Hyperlipidemia 12/01/2019   Hypertension    Hypothyroidism    Ischemic cardiomyopathy    a. EF 20-25% by echo in 02/2020 b. at 40% by echo in 03/2020 c. EF normalized to 60-65% by echo in 04/2020   Myocardial infarction [facility])    Peripheral vascular disease (HCC)    Sleep apnea    Type 2 diabetes mellitus (HCC)    Past Surgical History:  Past Surgical History:  Procedure Laterality Date   AMPUTATION Right 10/05/2022   Procedure: AMPUTATION BELOW KNEE;  Surgeon: Nadara Mustard, MD;  Location: [facility] OR;  Service: Orthopedics;  Laterality: Right;   AMPUTATION Right 11/14/2022   Procedure: AMPUTATION BELOW KNEE REVISION; WASHOUT;  Surgeon: Renford Dills, MD;  Location: [facility] ORS;  Service: Vascular;  Laterality: Right;   AMPUTATION Right 11/18/2022   Procedure: AMPUTATION BELOW KNEE REVISION AND CLOSURE;  Surgeon: Renford Dills, MD;  Location: [facility] ORS;  Service: Vascular;  Laterality: Right;   APPLICATION OF WOUND VAC Right 10/05/2022   Procedure: APPLICATION OF WOUND VAC;  Surgeon: Nadara Mustard, MD;  Location: [facility] OR;  Service: Orthopedics;  Laterality: Right;   BIOPSY  07/18/2021   Procedure: BIOPSY;  Surgeon: Lanelle Bal, DO;  Location: [facility];  Service: Endoscopy;;   BIOPSY  01/09/2022   Procedure:  BIOPSY;  Surgeon: Lanelle Bal, DO;  [facility]: [facility];  Service: Endoscopy;;   COLONOSCOPY WITH PROPOFOL N/A 07/18/2021   Carver: 15 millimeter polyp removed from the sigmoid colon, 5 mm polyp removed from sigmoid colon.  Nonbleeding internal hemorrhoids.  Significant looping of the colon. sigmoid path showed invasive colonic adenocarcinoma involving tubular adenoma (invades to depth of 2mm, carcinoma 1mm from margin, no lymphovascular invasion, no poorly differentiated component.   CORONARY ARTERY BYPASS GRAFT N/A 03/13/2020   Procedure: CORONARY ARTERY BYPASS GRAFTING (CABG) times five using bilateral Internal mammary arteries and left radial artery;  Surgeon: Linden Dolin, MD;  Location: [facility] OR;  Service: Open Heart Surgery;  Laterality: N/A;   DENTAL SURGERY     ESOPHAGOGASTRODUODENOSCOPY (EGD) WITH PROPOFOL N/A 07/18/2021   Carver: 1 gastric polyp status post biopsy, gastritis. gastric bx with slight chronic inflammation and no H.pyori. GEJ polypectomy with mild inflammation only   FLEXIBLE SIGMOIDOSCOPY N/A 08/26/2021   Carver: Nonbleeding internal hemorrhoids.  15 mm ulcers from previous polypectomy found in the rectum.  No evidence of previous polyp.  Located 5 to 8 cm from anal verge.   FLEXIBLE SIGMOIDOSCOPY N/A 01/09/2022   Procedure: FLEXIBLE SIGMOIDOSCOPY;  Surgeon: Lanelle Bal, DO;  [facility];  Service: Endoscopy;  Laterality: N/A;   POLYPECTOMY  07/18/2021   Procedure: POLYPECTOMY INTESTINAL;  Surgeon: Lanelle Bal, DO;  [facility];  Service: Endoscopy;;   RADIAL ARTERY HARVEST Left 03/13/2020   Procedure: RADIAL ARTERY HARVEST,;  Surgeon: Linden Dolin, MD;  Location: [facility] OR;  Service: Open Heart Surgery;  Laterality: Left;   RIGHT/LEFT HEART CATH AND CORONARY ANGIOGRAPHY N/A 03/07/2020   Procedure: RIGHT/LEFT HEART CATH AND CORONARY ANGIOGRAPHY;  Surgeon: Tonny Bollman, MD;  Location: [facility] INVASIVE CV LAB;  Service:  Cardiovascular;  Laterality: N/A;   STUMP REVISION Right 12/17/2022   Procedure: RIGHT BELOW KNEE AMPUTATION REVISION;  Surgeon: Nadara Mustard, MD;  Location: [facility] OR;  Service: Orthopedics;  Laterality: Right;   SUBMUCOSAL LIFTING INJECTION  01/09/2022   Procedure: SUBMUCOSAL LIFTING INJECTION;  Surgeon: Lanelle Bal, DO;  [facility];  Service: Endoscopy;;   SUBMUCOSAL TATTOO INJECTION  01/09/2022   Procedure: SUBMUCOSAL TATTOO INJECTION;  Surgeon: Lanelle Bal, DO;  [facility];  Service: Endoscopy;;   TEE WITHOUT CARDIOVERSION N/A 03/13/2020   Procedure: TRANSESOPHAGEAL ECHOCARDIOGRAM (TEE);  Surgeon: Linden Dolin, MD;  Location: [facility] OR;  Service: Open Heart Surgery;  Laterality: N/A;   HPI:  Per H&P "Shane Chambers is a 62 y.o. male with medical history significant for coronary artery disease status post CABG, COPD, depression, type II diabetes mellitus, GERD, hypertension, dyslipidemia and hypothyroidism as well as ischemic cardiomyopathy and peripheral vascular disease, who presented to the emergency room with acute onset of worsening dyspnea with associated increasing cough and wheezing over the last several days.  Symptoms have significantly worsened today.  He was having difficult time catching his breath.  He was also noted to be hypoxic requiring 2 to 3 L of O2 by nasal cannula.  No fever or chills.  No nausea or vomiting or abdominal pain.  No chest pain or palpitations.  No dysuria, oliguria or hematuria or flank pain."    Assessment / Plan / Recommendation  Clinical Impression  Pt seen for clinical swallowing evaluation. Pt presents with a mild oral dysphagia c/b prolonged mastication of solids likely due to sparse dentition. Pharyngeal swallow appeared [facility] per clinical assessment. Pt noted preferring "soft" food at baseline. Recommend diet downgrade to mech soft diet with thin liquids with safe swallowing strategies/aspiration precautions as outlined  below. SLP to sign off as pt likely at or near swallowing baseline.  SLP Visit Diagnosis: Dysphagia, oral phase (R13.11)    Aspiration Risk  Mild aspiration risk    Diet Recommendation Dysphagia 3 (Mech soft);Thin liquid   Medication Administration: Crushed with puree Supervision: Staff to assist with self feeding;Full supervision/cueing for compensatory strategies Compensations: Minimize environmental distractions;Slow rate;Small sips/bites Postural Changes: Seated upright at 90 degrees;Remain upright for at least 30 minutes after po intake    Other  Recommendations Oral Care Recommendations: Oral care QID    Recommendations for follow up therapy are one component of a multi-disciplinary discharge planning process, led by the attending physician.  Recommendations may be updated based on patient status, additional functional criteria and insurance authorization.  Follow up Recommendations No SLP follow up         Functional Status Assessment Patient has not had a recent decline in their functional status    Swallow Study   General Date of Onset: 03/26/23 (admission date) HPI: Per H&P "Shane Chambers is a 62 y.o. male with medical history significant for coronary artery disease status post CABG, COPD, depression, type II diabetes mellitus, GERD, hypertension, dyslipidemia and hypothyroidism as well as ischemic cardiomyopathy and peripheral vascular disease, who presented  to the emergency room with acute onset of worsening dyspnea with associated increasing cough and wheezing over the last several days.  Symptoms have significantly worsened today.  He was having difficult time catching his breath.  He was also noted to be hypoxic requiring 2 to 3 L of O2 by nasal cannula.  No fever or chills.  No nausea or vomiting or abdominal pain.  No chest pain or palpitations.  No dysuria, oliguria or hematuria or flank pain." Type of Study: Bedside Swallow Evaluation Previous Swallow Assessment:  none Diet Prior to this Study: Regular;Thin liquids (Level 0) Temperature Spikes Noted: No Respiratory Status: Room air History of Recent Intubation: No Behavior/Cognition: Lethargic/Drowsy Oral Cavity Assessment: Within Functional Limits Oral Care Completed by SLP: Recent completion by staff Oral Cavity - Dentition: Poor condition;Missing dentition Vision: Functional for self-feeding Self-Feeding Abilities: Total assist Baseline Vocal Quality: Normal Volitional Cough: Strong Volitional Swallow: Able to elicit    Oral/Motor/Sensory Function Overall Oral Motor/Sensory Function: Within functional limits   Ice Chips Ice chips: Not tested   Thin Liquid Thin Liquid: Within functional limits Presentation: Cup;Straw    Nectar Thick Nectar Thick Liquid: Not tested   Honey Thick Honey Thick Liquid: Not tested   Puree Puree: Within functional limits Presentation: Self Fed   Solid     Solid: Impaired Oral Phase Impairments: Impaired mastication Oral Phase Functional Implications: Impaired mastication Pharyngeal Phase Impairments:  (WFL)     Clyde Canterbury, M.S., CCC-SLP Speech-Language Pathologist [facility] [phone] (ASCOM)  Woodroe Chen 03/30/2023,2:02 PM

## 2023-03-30 NOTE — Progress Notes (Signed)
Progress Note   Patient: Shane Chambers ZOX:[id number] DOB: 1961-11-07 DOA: 03/26/2023     3 DOS: the patient was seen and examined on 03/30/2023   Brief hospital course: AUKAI DELDUCA is a 62 y.o. male with medical history significant for coronary artery disease status post CABG, COPD, depression, type II diabetes mellitus, GERD, hypertension, dyslipidemia and hypothyroidism as well as ischemic cardiomyopathy and peripheral vascular disease s/p right BKA, who presented to the emergency room with acute onset of worsening dyspnea with associated increasing cough and wheezing over the last several days.  He has a mild elevation of BNP chest x-ray showed vascular congestion.  He was started on IV Lasix.  Patient also had some bronchospasm, started on bronchodilator.   Principal Problem:   Acute on chronic heart failure with preserved ejection fraction (HFpEF) (HCC) Active Problems:   COPD with acute exacerbation (HCC)   Acute respiratory failure with hypoxia (HCC)   Type 2 diabetes mellitus without complications (HCC)   Hypothyroidism   Essential hypertension   Hypokalemia   BPH (benign prostatic hyperplasia)   Iron deficiency anemia   GERD without esophagitis   Morbid obesity (HCC)   Acquired hypothyroidism   Dyslipidemia   CKD stage 3a, GFR 45-59 ml/min (HCC)   Moderate aortic stenosis   Acute urinary retention   Assessment and Plan: Acute on chronic heart failure with preserved ejection fraction (HFpEF) (HCC) Moderate aortic stenosis.  Acute respiratory failure with hypoxia [facility]) Patient is seen by cardiology, on furosemide 40 mg twice a day IV.  Renal function was worse afterwards, received 25 g albumin.  Renal functions better today. Per cardiology, patient will need another day of IV Lasix.  Continue to follow. Patient had a significant cough after eating, seen by speech therapy, diet changed to dysphagia 3.  COPD with acute exacerbation (HCC) No additional bronchospasm.    BPH (benign prostatic hyperplasia) with urinary retention. Patient had a Foley catheter placed 2 months ago.  Continue Flomax.   Chronic kidney disease stage IIIa. Hypokalemia. Renal functions better after IV albumin.   Hypothyroidism Continue Synthroid.   Type 2 diabetes mellitus without complications (HCC) Glucose controlled.  Not on long-acting insulin.   Iron deficiency anemia. Received IV iron, hemoglobin is better today.   Dyslipidemia continue statin therapy.   GERD without esophagitis PPI.      Subjective:  Patient still has cough, short of breath much better.  Physical Exam: Vitals:   03/30/23 0737 03/30/23 0919 03/30/23 0939 03/30/23 1304  BP:  (!) 156/73  (!) 164/70  Pulse:  72  73  Resp:    18  Temp:  98 F (36.7 C)  97.6 F (36.4 C)  TempSrc:  Oral  Axillary  SpO2: 95% 95%  98%  Weight:   96 kg   Height:       General exam: Appears calm and comfortable  Respiratory system: Coarse breathing sounds. Respiratory effort normal. Cardiovascular system: S1 & S2 heard, RRR. No JVD, murmurs, rubs, gallops or clicks. No pedal edema. Gastrointestinal system: Abdomen is nondistended, soft and nontender. No organomegaly or masses felt. Normal bowel sounds heard. Central nervous system: Alert and oriented. No focal neurological deficits. Extremities: Right AKA. Skin: No rashes, lesions or ulcers Psychiatry: Judgement and insight appear normal. Mood & affect appropriate.    Data Reviewed:  Lab results reviewed.  Family Communication: None  Disposition: Status is: Inpatient Remains inpatient appropriate because: Severity of disease, IV treatment.     Time spent:  35 minutes  Author: Marrion Coy, MD 03/30/2023 1:38 PM  For on call review [email or web address].

## 2023-03-31 DIAGNOSIS — J9601 Acute respiratory failure with hypoxia: Secondary | ICD-10-CM | POA: Diagnosis not present

## 2023-03-31 DIAGNOSIS — J441 Chronic obstructive pulmonary disease with (acute) exacerbation: Secondary | ICD-10-CM | POA: Diagnosis not present

## 2023-03-31 DIAGNOSIS — I5033 Acute on chronic diastolic (congestive) heart failure: Secondary | ICD-10-CM | POA: Diagnosis not present

## 2023-03-31 LAB — GLUCOSE, CAPILLARY
Glucose-Capillary: 81 mg/dL (ref 70–99)
Glucose-Capillary: 128 mg/dL — ABNORMAL HIGH (ref 70–99)
Glucose-Capillary: 138 mg/dL — ABNORMAL HIGH (ref 70–99)
Glucose-Capillary: 104 mg/dL — ABNORMAL HIGH (ref 70–99)

## 2023-03-31 LAB — CBC
HCT: 27.9 % — ABNORMAL LOW (ref 39.0–52.0)
Hemoglobin: 8.9 g/dL — ABNORMAL LOW (ref 13.0–17.0)
MCH: 28 pg (ref 26.0–34.0)
MCHC: 31.9 g/dL (ref 30.0–36.0)
MCV: 87.7 fL (ref 80.0–100.0)
Platelets: 393 10*3/uL (ref 150–400)
RBC: 3.18 MIL/uL — ABNORMAL LOW (ref 4.22–5.81)
RDW: 13.5 % (ref 11.5–15.5)
WBC: 11.7 10*3/uL — ABNORMAL HIGH (ref 4.0–10.5)
nRBC: 0 % (ref 0.0–0.2)

## 2023-03-31 LAB — BASIC METABOLIC PANEL
Anion gap: 9 (ref 5–15)
BUN: 53 mg/dL — ABNORMAL HIGH (ref 6–20)
CO2: 26 mmol/L (ref 22–32)
Calcium: 9.1 mg/dL (ref 8.9–10.3)
Chloride: 105 mmol/L (ref 98–111)
Creatinine, Ser: 1.56 mg/dL — ABNORMAL HIGH (ref 0.61–1.24)
GFR, Estimated: 51 mL/min — ABNORMAL LOW (ref >60)
Glucose, Bld: 89 mg/dL (ref 70–99)
Potassium: 3.8 mmol/L (ref 3.5–5.1)
Sodium: 140 mmol/L (ref 135–145)

## 2023-03-31 LAB — MAGNESIUM: Magnesium: 1.8 mg/dL (ref 1.7–2.4)

## 2023-03-31 MED ORDER — HYDRALAZINE HCL 50 MG PO TABS
100.0000 mg | ORAL_TABLET | Freq: Three times a day (TID) | ORAL | Status: DC
  Administered 2023-03-31 – 2023-04-02 (×7): 100 mg via ORAL
  Filled 2023-03-31 (×7): qty 2

## 2023-03-31 NOTE — Progress Notes (Signed)
Progress Note   Patient: Shane Chambers:[id number] DOB: 18-Apr-1961 DOA: 03/26/2023     4 DOS: the patient was seen and examined on 03/31/2023   Brief hospital course: ABRAR LYNDE is a 62 y.o. male with medical history significant for coronary artery disease status post CABG, COPD, depression, type II diabetes mellitus, GERD, hypertension, dyslipidemia and hypothyroidism as well as ischemic cardiomyopathy and peripheral vascular disease s/p right BKA, who presented to the emergency room with acute onset of worsening dyspnea with associated increasing cough and wheezing over the last several days.  He has a mild elevation of BNP chest x-ray showed vascular congestion.  He was started on IV Lasix.  Patient also had some bronchospasm, started on bronchodilator.   Principal Problem:   Acute on chronic heart failure with preserved ejection fraction (HFpEF) (HCC) Active Problems:   COPD with acute exacerbation (HCC)   Acute respiratory failure with hypoxia (HCC)   Type 2 diabetes mellitus without complications (HCC)   Hypothyroidism   Essential hypertension   Hypokalemia   BPH (benign prostatic hyperplasia)   Iron deficiency anemia   GERD without esophagitis   Morbid obesity (HCC)   Acquired hypothyroidism   Dyslipidemia   CKD stage 3a, GFR 45-59 ml/min (HCC)   Moderate aortic stenosis   Acute urinary retention   Assessment and Plan: Acute on chronic heart failure with preserved ejection fraction (HFpEF) (HCC) Moderate aortic stenosis.  Acute respiratory failure with hypoxia [facility]) Patient is seen by cardiology, on furosemide 40 mg twice a day IV.  Renal function was worse afterwards, received 25 g albumin.  Renal functions better today. Patient had a significant cough after eating, seen by speech therapy, diet changed to dysphagia 3. Patient condition continued to improve, followed by cardiology, will discharge back to SNF after cleared by cardiology.   COPD with acute  exacerbation (HCC) No additional bronchospasm.   BPH (benign prostatic hyperplasia) with urinary retention. Patient had a Foley catheter placed 2 months ago.  Continue Flomax.   Chronic kidney disease stage IIIa. Hypokalemia. Renal functions better after IV albumin.   Hypothyroidism Continue Synthroid.   Type 2 diabetes mellitus without complications (HCC) Glucose controlled.  Not on long-acting insulin.   Iron deficiency anemia. Received IV iron, hemoglobin is better today.   Dyslipidemia continue statin therapy.   GERD without esophagitis PPI.      Subjective:  Doing better, short of breath improved.  Physical Exam: Vitals:   03/31/23 0311 03/31/23 0504 03/31/23 0700 03/31/23 1106  BP: (!) 172/71 (!) 166/74  133/64  Pulse: 75 78  71  Resp: 18   20  Temp: 97.8 F (36.6 C)   98.1 F (36.7 C)  TempSrc: Oral     SpO2: 98%   92%  Weight:   94.4 kg   Height:       General exam: Appears calm and comfortable  Respiratory system: Right lower lobe crackles. Respiratory effort normal. Cardiovascular system: S1 & S2 heard, RRR. No JVD, murmurs, rubs, gallops or clicks. No pedal edema. Gastrointestinal system: Abdomen is nondistended, soft and nontender. No organomegaly or masses felt. Normal bowel sounds heard. Central nervous system: Alert and oriented. No focal neurological deficits. Extremities: Right BKA. Skin: No rashes, lesions or ulcers Psychiatry: Judgement and insight appear normal. Mood & affect appropriate.     Data Reviewed:  Lab results reviewed.  Family Communication: None  Disposition: Status is: Inpatient Remains inpatient appropriate because: Severity of disease, IV Lasix.  Time spent: 35 minutes  Author: Marrion Coy, MD 03/31/2023 2:05 PM  For on call review [email or web address].

## 2023-03-31 NOTE — Progress Notes (Signed)
Rounding Note    Patient Name: Shane Chambers Date of Encounter: 03/31/2023  [facility] Cardiologist: Dina Rich, MD   Subjective   Patient seen on a.m. rounds.  Breathing has improved today.  Denies any chest pain or worsening shortness of breath.  Cough is also improved today.  -3.9 L output in the last 24 hours.  Creatinine still remains a stable.  Inpatient Medications    Scheduled Meds:  amLODipine  10 mg Oral Daily   atorvastatin  40 mg Oral QHS   carvedilol  25 mg Oral BID WC   Chlorhexidine Gluconate Cloth  6 each Topical Daily   cholecalciferol  5,000 Units Oral Q Mon   enoxaparin (LOVENOX) injection  55 mg Subcutaneous Q24H   feeding supplement (GLUCERNA SHAKE)  237 mL Oral TID with meals   ferrous gluconate  324 mg Oral Daily   furosemide  40 mg Intravenous Q12H   gabapentin  300 mg Oral QHS   hydrALAZINE  100 mg Oral Q8H   insulin aspart  0-5 Units Subcutaneous QHS   insulin aspart  0-9 Units Subcutaneous TID WC   levothyroxine  75 mcg Oral Q0600   mirtazapine  30 mg Oral QHS   multivitamin with minerals  1 tablet Oral Daily   pantoprazole  40 mg Oral Daily   senna-docusate  2 tablet Oral BID   tamsulosin  0.4 mg Oral QPM   thiamine  100 mg Oral Daily   venlafaxine XR  225 mg Oral Q breakfast   Continuous Infusions:  PRN Meds: acetaminophen **OR** acetaminophen, benzonatate, guaiFENesin-dextromethorphan, ipratropium-albuterol, magnesium hydroxide, polyvinyl alcohol, traZODone   Vital Signs    Vitals:   03/31/23 0311 03/31/23 0504 03/31/23 0700 03/31/23 1106  BP: (!) 172/71 (!) 166/74  133/64  Pulse: 75 78  71  Resp: 18   20  Temp: 97.8 F (36.6 C)   98.1 F (36.7 C)  TempSrc: Oral     SpO2: 98%   92%  Weight:   94.4 kg   Height:        Intake/Output Summary (Last 24 hours) at 03/31/2023 1341 Last data filed at 03/31/2023 0826 Gross per 24 hour  Intake 1210 ml  Output 4925 ml  Net -3715 ml      03/31/2023    7:00 AM  03/30/2023    9:39 AM 03/29/2023    7:22 AM  Last 3 Weights  Weight (lbs) 208 lb 1.8 oz 211 lb 10.3 oz 214 lb 8.1 oz  Weight (kg) 94.4 kg 96 kg 97.3 kg      Telemetry    Sinus rhythm at rates of 70-80- Personally Reviewed  ECG    No new tracings- Personally Reviewed  Physical Exam   GEN: No acute distress.  Ill-appearing Neck: No JVD Cardiac: RRR, III/VI systolic murmur, without rubs, or gallops.  Respiratory: Diminished to auscultation bilaterally.  Respirations remain unlabored at rest on room air GI: Soft, nontender, non-distended  MS: Trace pretibial edema; right BKA Neuro:  Nonfocal  Psych: Normal affect   Labs    High Sensitivity Troponin:   Recent Labs  Lab 03/26/23 2322 03/27/23 1002  TROPONINIHS 15 18*     Chemistry Recent Labs  Lab 03/26/23 2322 03/27/23 0508 03/28/23 0455 03/29/23 0431 03/30/23 0527 03/31/23 0603  NA 139   < > 142 140 140 140  K 3.2*   < > 4.2 4.4 4.0 3.8  CL 106   < > 111 108 104  105  CO2 24   < > 24 24 25 26   GLUCOSE 122*   < > 118* 101* 101* 89  BUN 33*   < > 52* 52* 52* 53*  CREATININE 1.54*   < > 1.74* 1.58* 1.57* 1.56*  CALCIUM 8.6*   < > 8.8* 9.2 8.9 9.1  MG 1.7  --  2.0  --  1.7 1.8  PROT 7.3  --   --   --   --   --   ALBUMIN 2.8*  --   --   --   --   --   AST 28  --   --   --   --   --   ALT 24  --   --   --   --   --   ALKPHOS 124  --   --   --   --   --   BILITOT 0.7  --   --   --   --   --   GFRNONAA 51*   < > 44* 50* 50* 51*  ANIONGAP 9   < > 7 8 11 9    < > = values in this interval not displayed.    Lipids No results for input(s): "CHOL", "TRIG", "HDL", "LABVLDL", "LDLCALC", "CHOLHDL" in the last 168 hours.  Hematology Recent Labs  Lab 03/28/23 0455 03/29/23 0431 03/31/23 0907  WBC 11.7* 13.2* 11.7*  RBC 3.12* 3.42* 3.18*  HGB 8.6* 9.6* 8.9*  HCT 27.7* 30.4* 27.9*  MCV 88.8 88.9 87.7  MCH 27.6 28.1 28.0  MCHC 31.0 31.6 31.9  RDW 14.0 13.8 13.5  PLT 324 376 393   Thyroid No results for input(s):  "TSH", "FREET4" in the last 168 hours.  BNP Recent Labs  Lab 03/26/23 2322  BNP 891.3*    DDimer No results for input(s): "DDIMER" in the last 168 hours.   Radiology    No results found.  Cardiac Studies   Echo 02/16/23: 1. Left ventricular ejection fraction, by estimation, is 60 to 65%. The  left ventricle has normal function. Left ventricular endocardial border  not optimally defined to evaluate regional wall motion. There is moderate  left ventricular hypertrophy.   2. Right ventricular systolic function is normal. The right ventricular  size is normal.   3. The aortic valve has an indeterminant number of cusps. There is mild  calcification of the aortic valve. There is mild thickening of the aortic  valve. Aortic valve regurgitation is not visualized. Moderate aortic valve  stenosis. Aortic valve mean  gradient measures 20.7 mmHg. Aortic valve peak gradient measures 35.3  mmHg. Aortic valve area, by VTI measures 1.17 cm.   Patient Profile     62 y.o. male with a past medical history of coronary disease status post CABG, moderate aortic stenosis, diabetes, hypertension, hyperlipidemia, COPD, gastroesophageal reflux disease, who is being seen and evaluated for acute on chronic diastolic congestive heart failure.  Assessment & Plan    Acute on chronic diastolic congestive heart failure -Patient was recently admitted to the hospital and torsemide was held 02/2023 at that time he had AKI -Presented to the emergency department with fluid overload --3.9 L output in the last 24 hours --13,419 L output since admission -Diuresing well with stable renal function -Consider transitioning IV furosemide back to oral torsemide in preparation for discharge -LVEF 60-65% -Not a candidate for MRA's due to history of hyperkalemia and not a good candidate for SGLT2 inhibitor with chronic Foley  catheter -Daily weights, I's and O's, low-sodium diet  Coronary disease status post  CABG -Continues to deny chest pain or anginal equivalents -No ischemic changes noted on telemetry -High-sensitivity troponins trended flat -No further ischemic workup needed at this time -Continued on aspirin, atorvastatin, carvedilol  Hypertension -Blood pressure 166/74 -Hydralazine increased to 100 mg 3 times daily, continued on carvedilol, furosemide, and amlodipine -Vital signs per unit protocol  Hyperlipidemia -Continued on statin therapy  Moderate aortic stenosis -Recent echocardiogram revealed aortic valve area by VTI measures 1.17 cm, valve peak gradient measuring 35.3 mmHg -Continue with outpatient monitoring with repeat echocardiogram in a year  Anemia -Hemoglobin 8.9 -Down from 9.6 -No active signs of bleeding -Continues to be managed by IM  CKD stage IIIa -Serum creatinine 1.56 -Remains stable -Has chronic Foley catheter in place -Continue to measure urine output -Monitor/trend/replete electrolytes as needed -Daily BMP -Avoid nephrotoxic agents were able  Type 2 diabetes -Continued on insulin therapy -Management per IM     For questions or updates, please contact [facility] Please consult [email or web address] for contact info under        Signed, Daivon Rayos, NP  03/31/2023, 1:41 PM

## 2023-04-01 DIAGNOSIS — J441 Chronic obstructive pulmonary disease with (acute) exacerbation: Secondary | ICD-10-CM | POA: Diagnosis not present

## 2023-04-01 DIAGNOSIS — E119 Type 2 diabetes mellitus without complications: Secondary | ICD-10-CM

## 2023-04-01 DIAGNOSIS — I5033 Acute on chronic diastolic (congestive) heart failure: Secondary | ICD-10-CM | POA: Diagnosis not present

## 2023-04-01 DIAGNOSIS — J9601 Acute respiratory failure with hypoxia: Secondary | ICD-10-CM | POA: Diagnosis not present

## 2023-04-01 LAB — GLUCOSE, CAPILLARY
Glucose-Capillary: 89 mg/dL (ref 70–99)
Glucose-Capillary: 166 mg/dL — ABNORMAL HIGH (ref 70–99)
Glucose-Capillary: 134 mg/dL — ABNORMAL HIGH (ref 70–99)
Glucose-Capillary: 117 mg/dL — ABNORMAL HIGH (ref 70–99)

## 2023-04-01 LAB — BASIC METABOLIC PANEL
Anion gap: 10 (ref 5–15)
BUN: 56 mg/dL — ABNORMAL HIGH (ref 6–20)
CO2: 26 mmol/L (ref 22–32)
Calcium: 9.2 mg/dL (ref 8.9–10.3)
Chloride: 105 mmol/L (ref 98–111)
Creatinine, Ser: 1.66 mg/dL — ABNORMAL HIGH (ref 0.61–1.24)
GFR, Estimated: 47 mL/min — ABNORMAL LOW (ref >60)
Glucose, Bld: 92 mg/dL (ref 70–99)
Potassium: 4.2 mmol/L (ref 3.5–5.1)
Sodium: 141 mmol/L (ref 135–145)

## 2023-04-01 LAB — MAGNESIUM: Magnesium: 1.9 mg/dL (ref 1.7–2.4)

## 2023-04-01 MED ORDER — TORSEMIDE 20 MG PO TABS
40.0000 mg | ORAL_TABLET | Freq: Every day | ORAL | Status: DC
  Administered 2023-04-02: 40 mg via ORAL
  Filled 2023-04-01: qty 2

## 2023-04-01 MED ORDER — ASPIRIN 81 MG PO CHEW
81.0000 mg | CHEWABLE_TABLET | Freq: Every day | ORAL | Status: DC
  Administered 2023-04-01 – 2023-04-02 (×2): 81 mg via ORAL
  Filled 2023-04-01 (×2): qty 1

## 2023-04-01 NOTE — Progress Notes (Signed)
PT Cancellation Note  Patient Details Name: Shane Chambers MRN: [id number] DOB: 25-Nov-1960   Cancelled Treatment:     Pt adamantly refused, would not open eyes. Will re-attempt next available date/time per POC.    Jannet Askew 04/01/2023, 2:13 PM

## 2023-04-01 NOTE — Progress Notes (Signed)
[facility]  PROGRESS NOTE  Shane Chambers UJW:[id number] DOB: October 11, 1961 DOA: 03/26/2023 PCP: [name], MD   Brief HPI:   62 y.o. male with medical history significant for coronary artery disease status post CABG, COPD, depression, type II diabetes mellitus, GERD, hypertension, dyslipidemia and hypothyroidism as well as ischemic cardiomyopathy and peripheral vascular disease s/p right BKA, who presented to the emergency room with acute onset of worsening dyspnea with associated increasing cough and wheezing over the last several days.  He has a mild elevation of BNP chest x-ray showed vascular congestion.  He was started on IV Lasix.  Patient also had some bronchospasm, started on bronchodilator.       Assessment/Plan:    Acute on chronic heart failure with preserved ejection fraction -Moderate aortic stenosis -Presented with CHF exacerbation, started on Lasix 40 mg IV twice daily -Cardiology was consulted, also received albumin 25 g -At this time patient back to baseline, IV Lasix has been discontinued due to worsening renal function -plan to start torsemide from tomorrow morning  COPD with acute exacerbation -Stable  BPH with urinary retention -Patient had Foley catheter placed 2 months ago -Continue Flomax  CKD stage IIIa -Creatinine 1.66, Lasix on hold -Will be discharged on torsemide -Follow-up BMP in a.m.  Hypothyroidism Continue Synthroid.   Type 2 diabetes mellitus without complications (HCC) Glucose controlled.  Not on long-acting insulin.   Iron deficiency anemia. Received IV iron, hemoglobin is better today.   Dyslipidemia continue statin therapy.   GERD without esophagitis PPI.   Medications     amLODipine  10 mg Oral Daily   aspirin  81 mg Oral Daily   atorvastatin  40 mg Oral QHS   carvedilol  25 mg Oral BID WC   Chlorhexidine Gluconate Cloth  6 each Topical Daily   cholecalciferol  5,000 Units Oral Q Mon   enoxaparin (LOVENOX)  injection  55 mg Subcutaneous Q24H   feeding supplement (GLUCERNA SHAKE)  237 mL Oral TID with meals   ferrous gluconate  324 mg Oral Daily   gabapentin  300 mg Oral QHS   hydrALAZINE  100 mg Oral Q8H   insulin aspart  0-5 Units Subcutaneous QHS   insulin aspart  0-9 Units Subcutaneous TID WC   levothyroxine  75 mcg Oral Q0600   mirtazapine  30 mg Oral QHS   multivitamin with minerals  1 tablet Oral Daily   pantoprazole  40 mg Oral Daily   senna-docusate  2 tablet Oral BID   tamsulosin  0.4 mg Oral QPM   thiamine  100 mg Oral Daily   [START ON 04/02/2023] torsemide  40 mg Oral Daily   venlafaxine XR  225 mg Oral Q breakfast     Data Reviewed:   CBG:  Recent Labs  Lab 03/31/23 1531 03/31/23 2157 04/01/23 0844 04/01/23 1217 04/01/23 1713  GLUCAP 138* 104* 89 166* 134*    SpO2: 95 % O2 Flow Rate (L/min): 0 L/min    Vitals:   04/01/23 0716 04/01/23 0842 04/01/23 1216 04/01/23 1712  BP:  137/63 (!) 148/61 131/65  Pulse:  72 69 76  Resp:  16 20 16   Temp:  98.5 F (36.9 C) 97.7 F (36.5 C) 98.1 F (36.7 C)  TempSrc:  Oral Oral   SpO2:  94% 97% 95%  Weight: 92.8 kg     Height:          Data Reviewed:  Basic Metabolic Panel: Recent Labs  Lab 03/26/23 2322 03/27/23  1610 03/28/23 0455 03/29/23 0431 03/30/23 0527 03/31/23 0603 04/01/23 0720  NA 139   < > 142 140 140 140 141  K 3.2*   < > 4.2 4.4 4.0 3.8 4.2  CL 106   < > 111 108 104 105 105  CO2 24   < > 24 24 25 26 26   GLUCOSE 122*   < > 118* 101* 101* 89 92  BUN 33*   < > 52* 52* 52* 53* 56*  CREATININE 1.54*   < > 1.74* 1.58* 1.57* 1.56* 1.66*  CALCIUM 8.6*   < > 8.8* 9.2 8.9 9.1 9.2  MG 1.7  --  2.0  --  1.7 1.8 1.9   < > = values in this interval not displayed.    CBC: Recent Labs  Lab 03/26/23 2322 03/27/23 0508 03/28/23 0455 03/29/23 0431 03/31/23 0907  WBC 8.5 6.9 11.7* 13.2* 11.7*  NEUTROABS 6.2  --   --   --   --   HGB 9.4* 9.7* 8.6* 9.6* 8.9*  HCT 29.4* 30.7* 27.7* 30.4* 27.9*   MCV 89.1 88.5 88.8 88.9 87.7  PLT 305 312 324 376 393    LFT Recent Labs  Lab 03/26/23 2322  AST 28  ALT 24  ALKPHOS 124  BILITOT 0.7  PROT 7.3  ALBUMIN 2.8*     Antibiotics: Anti-infectives (From admission, onward)    Start     Dose/Rate Route Frequency Ordered Stop   03/27/23 0330  cefTRIAXone (ROCEPHIN) 1 g in sodium chloride 0.9 % 100 mL IVPB  Status:  Discontinued        1 g 200 mL/hr over 30 Minutes Intravenous Every 24 hours 03/27/23 0329 03/27/23 1255        DVT prophylaxis: Lovenox  Code Status: Full code  Family Communication:    CONSULTS    Subjective   Denies shortness of breath   Objective    Physical Examination:  General-appears in no acute distress Heart-S1-S2, regular, no murmur auscultated Lungs-clear to auscultation bilaterally, no wheezing or crackles auscultated Abdomen-soft, nontender, no organomegaly Extremities-no edema in the lower extremities Neuro-alert, oriented x3, no focal deficit noted  Status is: Inpatient:             Meredeth Ide   [facility] If 7PM-7AM, please contact night-coverage at [email or web address], Office  [phone]   04/01/2023, 5:41 PM  LOS: 5 days

## 2023-04-01 NOTE — Progress Notes (Signed)
Cardiology Progress Note   Patient Name: Shane Chambers Date of Encounter: 04/01/2023  Primary Cardiologist: Dina Rich, MD  Subjective   Tired.  No chest pain.  Thinks breathing is nearing baseline.  Lower ext edema improved.  Inpatient Medications    Scheduled Meds:  amLODipine  10 mg Oral Daily   atorvastatin  40 mg Oral QHS   carvedilol  25 mg Oral BID WC   Chlorhexidine Gluconate Cloth  6 each Topical Daily   cholecalciferol  5,000 Units Oral Q Mon   enoxaparin (LOVENOX) injection  55 mg Subcutaneous Q24H   feeding supplement (GLUCERNA SHAKE)  237 mL Oral TID with meals   ferrous gluconate  324 mg Oral Daily   gabapentin  300 mg Oral QHS   hydrALAZINE  100 mg Oral Q8H   insulin aspart  0-5 Units Subcutaneous QHS   insulin aspart  0-9 Units Subcutaneous TID WC   levothyroxine  75 mcg Oral Q0600   mirtazapine  30 mg Oral QHS   multivitamin with minerals  1 tablet Oral Daily   pantoprazole  40 mg Oral Daily   senna-docusate  2 tablet Oral BID   tamsulosin  0.4 mg Oral QPM   thiamine  100 mg Oral Daily   venlafaxine XR  225 mg Oral Q breakfast   Continuous Infusions:  PRN Meds: acetaminophen **OR** acetaminophen, benzonatate, guaiFENesin-dextromethorphan, ipratropium-albuterol, magnesium hydroxide, polyvinyl alcohol, traZODone   Vital Signs    Vitals:   04/01/23 0419 04/01/23 0716 04/01/23 0842 04/01/23 1216  BP: (!) 140/69  137/63 (!) 148/61  Pulse: 79  72 69  Resp: 18  16 20   Temp: 97.9 F (36.6 C)  98.5 F (36.9 C) 97.7 F (36.5 C)  TempSrc:   Oral Oral  SpO2: 91%  94% 97%  Weight:  92.8 kg    Height:        Intake/Output Summary (Last 24 hours) at 04/01/2023 1419 Last data filed at 04/01/2023 1354 Gross per 24 hour  Intake 240 ml  Output 4850 ml  Net -4610 ml   Filed Weights   03/30/23 0939 03/31/23 0700 04/01/23 0716  Weight: 96 kg 94.4 kg 92.8 kg    Physical Exam   GEN: Well nourished, well developed, in no acute distress.  HEENT:  Grossly normal.  Neck: Supple, no JVD, carotid bruits, or masses. Cardiac: RRR, 3/6 syst murmur, no rubs or gallops. No clubbing, cyanosis, edema.  R BKA.   Respiratory:  Respirations regular and unlabored, clear to auscultation bilaterally. GI: Soft, nontender, nondistended, BS + x 4. MS: no deformity or atrophy. Skin: warm and dry, no rash. Neuro:  Strength and sensation are intact. Psych: AAOx3.  Normal affect.  Labs    Chemistry Recent Labs  Lab 03/26/23 2322 03/27/23 0508 03/30/23 0527 03/31/23 0603 04/01/23 0720  NA 139   < > 140 140 141  K 3.2*   < > 4.0 3.8 4.2  CL 106   < > 104 105 105  CO2 24   < > 25 26 26   GLUCOSE 122*   < > 101* 89 92  BUN 33*   < > 52* 53* 56*  CREATININE 1.54*   < > 1.57* 1.56* 1.66*  CALCIUM 8.6*   < > 8.9 9.1 9.2  PROT 7.3  --   --   --   --   ALBUMIN 2.8*  --   --   --   --   AST 28  --   --   --   --  ALT 24  --   --   --   --   ALKPHOS 124  --   --   --   --   BILITOT 0.7  --   --   --   --   GFRNONAA 51*   < > 50* 51* 47*  ANIONGAP 9   < > 11 9 10    < > = values in this interval not displayed.     Hematology Recent Labs  Lab 03/28/23 0455 03/29/23 0431 03/31/23 0907  WBC 11.7* 13.2* 11.7*  RBC 3.12* 3.42* 3.18*  HGB 8.6* 9.6* 8.9*  HCT 27.7* 30.4* 27.9*  MCV 88.8 88.9 87.7  MCH 27.6 28.1 28.0  MCHC 31.0 31.6 31.9  RDW 14.0 13.8 13.5  PLT 324 376 393    Cardiac Enzymes  Recent Labs  Lab 03/26/23 2322 03/27/23 1002  TROPONINIHS 15 18*      BNP    Component Value Date/Time   BNP 891.3 (H) 03/26/2023 2322    Lipids  Lab Results  Component Value Date   CHOL 147 11/07/2022   HDL 35 (L) 11/07/2022   LDLCALC 80 11/07/2022   TRIG 159 (H) 11/07/2022   CHOLHDL 4.2 11/07/2022    HbA1c  Lab Results  Component Value Date   HGBA1C 6.1 (H) 11/07/2022    Radiology    DG Chest Port 1 View  Result Date: 03/29/2023 CLINICAL DATA:  Cough EXAM: PORTABLE CHEST 1 VIEW COMPARISON:  03/26/2023 FINDINGS: Single  frontal view of the chest demonstrates postsurgical changes from median sternotomy. The cardiac silhouette is enlarged. Chronic elevation of the right hemidiaphragm. Chronic central vascular congestion without airspace disease, effusion, or pneumothorax. IMPRESSION: 1. Chronic central vascular congestion.  No overt edema. 2. No acute airspace disease. Electronically Signed   By: Sharlet Salina M.D.   On: 03/29/2023 15:55    Telemetry    RSR - Personally Reviewed  Cardiac Studies   Echo 02/16/23: 1. Left ventricular ejection fraction, by estimation, is 60 to 65%. The  left ventricle has normal function. Left ventricular endocardial border  not optimally defined to evaluate regional wall motion. There is moderate  left ventricular hypertrophy.   2. Right ventricular systolic function is normal. The right ventricular  size is normal.   3. The aortic valve has an indeterminant number of cusps. There is mild  calcification of the aortic valve. There is mild thickening of the aortic  valve. Aortic valve regurgitation is not visualized. Moderate aortic valve  stenosis. Aortic valve mean  gradient measures 20.7 mmHg. Aortic valve peak gradient measures 35.3  mmHg. Aortic valve area, by VTI measures 1.17 cm.   Patient Profile     62 y.o. male w/ a h/o CAD s/p CABG, moderate AS, DM, HTN, HL, COPD, and GERD, who was admitted 5/16 w/ volume overload.  Assessment & Plan    1.  Acute on chronic HFpEF:  Presented 5/16 w/ dyspnea and volume overload.  EF 60-65% by echo this admission.  Wt 110 kg on admission and has since ome down to 92.8 kg w/ diuresis (lower than any prior wt on record). Intakes may be inaccurate but good UO recorded.  Minus 18.4L since admission.  Appears euvolemic on exam.  BUN/Creat bumping slightly.  Previously on torsemide 40mg  daily at home prior to admission w/ metabolic acidosis and AKI in early April.  Will resume torsemide 40mg  daily starting tomorrow.  HRs stable.  BPs  variable.  Cont current meds.  Consider dapagliflozin the setting of HFpEF and chronic kidney disease.  2.  CAD: s/p prior CABG.  No chest pain.  Troponin minimally elevated at 18.  Echo with normal LV function and moderate AS earlier this year.  Cont med rx w/ ? blocker, statin. Was on ASA @ home - resume.  3.  Essential hypertension: Blood pressure variable on beta-blocker, calcium channel blocker, and hydralazine.  Continue to follow.  4.  Hyperlipidemia: LDL of 80 in December 2023.  Currently on atorvastatin 40 mg daily.  Follow-up fasting lipids and if LDL remains above goal, will titrate atorvastatin 80 mg and consider adding ezetimibe.  5.  Moderate aortic stenosis: Recent echo with a 35.3 mm peak gradient and aortic valve area of 1 0.17 cm.  Plan for follow-up echo in a year.  6.  Stage III chronic kidney disease: As above, BUN/creatinine increasing slightly in the setting of IV diuresis.  Transition to oral diuretic therapy for tomorrow.  7.  Type 2 diabetes mellitus: A1c 6.1 in December.  Per internal medicine team.  Consider empagliflozin therapy in the setting of HFpEF and chronic kidney disease.  8.  Normocytic anemia: Relatively stable.  Signed, Nicolasa Ducking, NP  04/01/2023, 2:19 PM    For questions or updates, please contact   Please consult [email or web address] for contact info under Cardiology/STEMI.

## 2023-04-01 NOTE — TOC Progression Note (Signed)
Transition of Care [facility]) - Progression Note    Patient Details  Name: Shane Chambers MRN: [id number] Date of Birth: 1961-03-13  [facility]) CM/SW Contact  Truddie Hidden, RN Phone Number: 04/01/2023, 1:27 PM  Clinical Narrative:    Case reviewed for DME needs and changes in discharge disposition.      Barriers to Discharge: Barriers Resolved  Expected Discharge Plan and Services                                               Social Determinants of Health (SDOH) Interventions SDOH Screenings   Food Insecurity: No Food Insecurity (03/27/2023)  Housing: Low Risk  (03/27/2023)  Transportation Needs: No Transportation Needs (03/27/2023)  Utilities: Not At Risk (03/27/2023)  Alcohol Screen: Low Risk  (11/05/2022)  Tobacco Use: Medium Risk (03/27/2023)    Readmission Risk Interventions    02/19/2023    1:52 PM 02/15/2023   11:15 AM 11/11/2022    2:05 PM  Readmission Risk Prevention Plan  Transportation Screening Complete Complete   Medication Review Oceanographer) Complete Complete   PCP or Specialist appointment within 3-5 days of discharge Complete    HRI or Home Care Consult Complete Complete   SW Recovery Care/Counseling Consult Complete Complete   Palliative Care Screening Not Applicable Not Applicable   Skilled Nursing Facility Complete Complete      Information is confidential and restricted. Go to Review Flowsheets to unlock data.

## 2023-04-02 ENCOUNTER — Ambulatory Visit: Payer: Medicare Other | Admitting: Nurse Practitioner

## 2023-04-02 DIAGNOSIS — D509 Iron deficiency anemia, unspecified: Secondary | ICD-10-CM

## 2023-04-02 DIAGNOSIS — J9601 Acute respiratory failure with hypoxia: Secondary | ICD-10-CM | POA: Diagnosis not present

## 2023-04-02 DIAGNOSIS — R338 Other retention of urine: Secondary | ICD-10-CM

## 2023-04-02 DIAGNOSIS — E119 Type 2 diabetes mellitus without complications: Secondary | ICD-10-CM | POA: Diagnosis not present

## 2023-04-02 DIAGNOSIS — I5033 Acute on chronic diastolic (congestive) heart failure: Secondary | ICD-10-CM | POA: Diagnosis not present

## 2023-04-02 DIAGNOSIS — N400 Enlarged prostate without lower urinary tract symptoms: Secondary | ICD-10-CM

## 2023-04-02 LAB — LIPID PANEL
Cholesterol: 104 mg/dL (ref 0–200)
HDL: 28 mg/dL — ABNORMAL LOW (ref >40)
LDL Cholesterol: 61 mg/dL (ref 0–99)
Total CHOL/HDL Ratio: 3.7 RATIO
Triglycerides: 74 mg/dL (ref <150)
VLDL: 15 mg/dL (ref 0–40)

## 2023-04-02 LAB — BASIC METABOLIC PANEL
Anion gap: 9 (ref 5–15)
BUN: 61 mg/dL — ABNORMAL HIGH (ref 6–20)
CO2: 26 mmol/L (ref 22–32)
Calcium: 8.9 mg/dL (ref 8.9–10.3)
Chloride: 105 mmol/L (ref 98–111)
Creatinine, Ser: 1.7 mg/dL — ABNORMAL HIGH (ref 0.61–1.24)
GFR, Estimated: 46 mL/min — ABNORMAL LOW (ref >60)
Glucose, Bld: 96 mg/dL (ref 70–99)
Potassium: 4.2 mmol/L (ref 3.5–5.1)
Sodium: 140 mmol/L (ref 135–145)

## 2023-04-02 LAB — GLUCOSE, CAPILLARY
Glucose-Capillary: 88 mg/dL (ref 70–99)
Glucose-Capillary: 167 mg/dL — ABNORMAL HIGH (ref 70–99)

## 2023-04-02 MED ORDER — TORSEMIDE 20 MG PO TABS: 20.0000 mg | ORAL_TABLET | Freq: Every day | ORAL | Status: DC

## 2023-04-02 MED ORDER — CARVEDILOL 25 MG PO TABS: 25.0000 mg | ORAL_TABLET | Freq: Two times a day (BID) | ORAL | Status: DC

## 2023-04-02 MED ORDER — HYDRALAZINE HCL 100 MG PO TABS: 100.0000 mg | ORAL_TABLET | Freq: Three times a day (TID) | ORAL | Status: DC

## 2023-04-02 NOTE — Progress Notes (Signed)
PT Cancellation Note  Patient Details Name: Shane Chambers MRN: [id number] DOB: November 24, 1960   Cancelled Treatment:    Reason Eval/Treat Not Completed: Patient declined, no reason specified. Patient sleeping, declines getting up to recliner, states he is going home today. Will re-attempt later if still here.    Shane Chambers 04/02/2023, 9:27 AM

## 2023-04-02 NOTE — Progress Notes (Signed)
Cardiology Progress Note   Patient Name: Shane Chambers Date of Encounter: 04/02/2023  Primary Cardiologist: Dina Rich, MD  Subjective   Ongoing nonproductive cough, but otherwise feels well.  No dyspnea or edema.  Inpatient Medications    Scheduled Meds:  amLODipine  10 mg Oral Daily   aspirin  81 mg Oral Daily   atorvastatin  40 mg Oral QHS   carvedilol  25 mg Oral BID WC   Chlorhexidine Gluconate Cloth  6 each Topical Daily   cholecalciferol  5,000 Units Oral Q Mon   enoxaparin (LOVENOX) injection  55 mg Subcutaneous Q24H   feeding supplement (GLUCERNA SHAKE)  237 mL Oral TID with meals   ferrous gluconate  324 mg Oral Daily   gabapentin  300 mg Oral QHS   hydrALAZINE  100 mg Oral Q8H   insulin aspart  0-5 Units Subcutaneous QHS   insulin aspart  0-9 Units Subcutaneous TID WC   levothyroxine  75 mcg Oral Q0600   mirtazapine  30 mg Oral QHS   multivitamin with minerals  1 tablet Oral Daily   pantoprazole  40 mg Oral Daily   senna-docusate  2 tablet Oral BID   tamsulosin  0.4 mg Oral QPM   thiamine  100 mg Oral Daily   torsemide  40 mg Oral Daily   venlafaxine XR  225 mg Oral Q breakfast   Continuous Infusions:  PRN Meds: acetaminophen **OR** acetaminophen, benzonatate, guaiFENesin-dextromethorphan, ipratropium-albuterol, magnesium hydroxide, polyvinyl alcohol, traZODone   Vital Signs    Vitals:   04/01/23 2121 04/01/23 2344 04/02/23 0422 04/02/23 0808  BP: (!) 146/59 137/61 136/65 (!) 142/68  Pulse: 76 73 66 65  Resp: 19 18 20  (!) 5  Temp: 98.1 F (36.7 C) 97.6 F (36.4 C) 97.6 F (36.4 C) (!) 97.4 F (36.3 C)  TempSrc: Oral   Oral  SpO2: 96% 98% 96% 95%  Weight:      Height:        Intake/Output Summary (Last 24 hours) at 04/02/2023 1046 Last data filed at 04/02/2023 0849 Gross per 24 hour  Intake 240 ml  Output 3525 ml  Net -3285 ml   Filed Weights   03/30/23 0939 03/31/23 0700 04/01/23 0716  Weight: 96 kg 94.4 kg 92.8 kg     Physical Exam   GEN: Well nourished, well developed, in no acute distress.  HEENT: Grossly normal.  Neck: Supple, no JVD, carotid bruits, or masses. Cardiac: RRR, 3/6 syst murmur, no rubs or gallops. No clubbing, cyanosis, edema.  R BKA. Respiratory:  Respirations regular and unlabored, clear to auscultation bilaterally. GI: Soft, nontender, nondistended, BS + x 4. MS: no deformity or atrophy. Skin: warm and dry, no rash. Neuro:  Strength and sensation are intact. Psych: AAOx3.  Normal affect.  Labs    Chemistry Recent Labs  Lab 03/26/23 2322 03/27/23 0508 03/31/23 0603 04/01/23 0720 04/02/23 0625  NA 139   < > 140 141 140  K 3.2*   < > 3.8 4.2 4.2  CL 106   < > 105 105 105  CO2 24   < > 26 26 26   GLUCOSE 122*   < > 89 92 96  BUN 33*   < > 53* 56* 61*  CREATININE 1.54*   < > 1.56* 1.66* 1.70*  CALCIUM 8.6*   < > 9.1 9.2 8.9  PROT 7.3  --   --   --   --   ALBUMIN 2.8*  --   --   --   --  AST 28  --   --   --   --   ALT 24  --   --   --   --   ALKPHOS 124  --   --   --   --   BILITOT 0.7  --   --   --   --   GFRNONAA 51*   < > 51* 47* 46*  ANIONGAP 9   < > 9 10 9    < > = values in this interval not displayed.     Hematology Recent Labs  Lab 03/28/23 0455 03/29/23 0431 03/31/23 0907  WBC 11.7* 13.2* 11.7*  RBC 3.12* 3.42* 3.18*  HGB 8.6* 9.6* 8.9*  HCT 27.7* 30.4* 27.9*  MCV 88.8 88.9 87.7  MCH 27.6 28.1 28.0  MCHC 31.0 31.6 31.9  RDW 14.0 13.8 13.5  PLT 324 376 393    Cardiac Enzymes  Recent Labs  Lab 03/26/23 2322 03/27/23 1002  TROPONINIHS 15 18*      BNP    Component Value Date/Time   BNP 891.3 (H) 03/26/2023 2322    Lipids  Lab Results  Component Value Date   CHOL 104 04/02/2023   HDL 28 (L) 04/02/2023   LDLCALC 61 04/02/2023   TRIG 74 04/02/2023   CHOLHDL 3.7 04/02/2023    HbA1c  Lab Results  Component Value Date   HGBA1C 6.1 (H) 11/07/2022    Radiology    DG Chest Port 1 View  Result Date: 03/29/2023 CLINICAL  DATA:  Cough EXAM: PORTABLE CHEST 1 VIEW COMPARISON:  03/26/2023 FINDINGS: Single frontal view of the chest demonstrates postsurgical changes from median sternotomy. The cardiac silhouette is enlarged. Chronic elevation of the right hemidiaphragm. Chronic central vascular congestion without airspace disease, effusion, or pneumothorax. IMPRESSION: 1. Chronic central vascular congestion.  No overt edema. 2. No acute airspace disease. Electronically Signed   By: Sharlet Salina M.D.   On: 03/29/2023 15:55    Telemetry    RSR - Personally Reviewed  Cardiac Studies   Echo 02/16/23: 1. Left ventricular ejection fraction, by estimation, is 60 to 65%. The  left ventricle has normal function. Left ventricular endocardial border  not optimally defined to evaluate regional wall motion. There is moderate  left ventricular hypertrophy.   2. Right ventricular systolic function is normal. The right ventricular  size is normal.   3. The aortic valve has an indeterminant number of cusps. There is mild  calcification of the aortic valve. There is mild thickening of the aortic  valve. Aortic valve regurgitation is not visualized. Moderate aortic valve  stenosis. Aortic valve mean  gradient measures 20.7 mmHg. Aortic valve peak gradient measures 35.3  mmHg. Aortic valve area, by VTI measures 1.17 cm.   Patient Profile     62 y.o. male w/ a h/o CAD s/p CABG, moderate AS, DM, HTN, HL, COPD, and GERD, who was admitted 5/16 w/ volume overload.   Assessment & Plan    1. Acute on chronic HFpEF: Presented 5/16 w/ dyspnea and volume overload. EF 60-65% by echo this admission. Wt 110 kg on admission and has since ome down to 92.8 kg w/ diuresis (lower than any prior wt on record).  Minus 3.1L yesterday.  BUN/Creat sl higher today @ 61/1.70.  Will reduce torsemide to 20mg  daily at discharge.  Provided he is weighed daily, hopefully his facility will be able to provide an additional 20mg  of torsemide for wt gain of 2 lbs  in 24 hrs. No  SGLT2i in setting of relatively bedridden status.  H/o hyperkalemia and AKI makes him poor candidate for MRA.  2.  CAD: s/p prior CABG.  No chest pain.  Troponin minimally elevated at 18.  Echo with normal LV function and moderate AS earlier this year.  Cont med rx w/ asa, ? blocker, statin.   3.  Essential hypertension: Blood pressure variable on beta-blocker, calcium channel blocker, and hydralazine.   4.  HL:  LDL 61 this AM.  Cont atorva 40.  5.  Moderate aortic stenosis: Recent echo with a 35.3 mm peak gradient and aortic valve area of 1 0.17 cm. Plan for follow-up echo in a year.   6.  CKD III:  BUN/Creat higher this AM @ 61/1.7.  Reducing torsemide back to 20 daily.  Should have BMET next week - we've arranged for office f/u in [place].  7.  DMII: A1c 6.1.  Per IM.  Not felt to be good candidate for SGLT2i.  8.  Normocytic anemia:  stable throughout admission.  Signed, Nicolasa Ducking, NP  04/02/2023, 10:46 AM    For questions or updates, please contact   Please consult [email or web address] for contact info under Cardiology/STEMI.

## 2023-04-02 NOTE — TOC Transition Note (Signed)
Transition of Care [facility]) - CM/SW Discharge Note   Patient Details  Name: Shane Chambers MRN: [id number] Date of Birth: 05-20-1961  [facility]) CM/SW Contact:  Truddie Hidden, RN Phone Number: 04/02/2023, 11:24 AM   Clinical Narrative:    Sherron Monday with [facility] in admissions. Per facility patient admissions confirm for today. Patient assigned room # 423 Nurse will call report to [phone] Nurse, and patient notified  Attempt to contact patient's mother, no answer.  Face sheet and medical necessity forms printed to the floor to be added to the EMS pack EMS arranged  Discharge summary and SNF tranfer report sent in HUB.  TOC signing off.    Final next level of care: Skilled Nursing Facility Barriers to Discharge: Barriers Resolved   Patient Goals and CMS Choice      Discharge Placement                Patient chooses bed at: [facility] Patient to be transferred to facility by: [facility] Name of family member notified: Mother Patient and family notified of of transfer: 04/02/23  Discharge Plan and Services Additional resources added to the After Visit Summary for                                       Social Determinants of Health (SDOH) Interventions SDOH Screenings   Food Insecurity: No Food Insecurity (03/27/2023)  Housing: Low Risk  (03/27/2023)  Transportation Needs: No Transportation Needs (03/27/2023)  Utilities: Not At Risk (03/27/2023)  Alcohol Screen: Low Risk  (11/05/2022)  Tobacco Use: Medium Risk (03/27/2023)     Readmission Risk Interventions    02/19/2023    1:52 PM 02/15/2023   11:15 AM 11/11/2022    2:05 PM  Readmission Risk Prevention Plan  Transportation Screening Complete Complete   Medication Review Oceanographer) Complete Complete   PCP or Specialist appointment within 3-5 days of discharge Complete    HRI or Home Care Consult Complete Complete   SW Recovery Care/Counseling Consult Complete Complete    Palliative Care Screening Not Applicable Not Applicable   Skilled Nursing Facility Complete Complete      Information is confidential and restricted. Go to Review Flowsheets to unlock data.

## 2023-04-02 NOTE — Discharge Summary (Signed)
Physician Discharge Summary   Patient: Shane Chambers MRN: [id number] DOB: Jul 11, 1961  Admit date:     03/26/2023  Discharge date: 04/02/23  Discharge Physician: Meredeth Ide   PCP: [name], MD   Recommendations at discharge:   Follow-up cardiology on 04/10/2023 at 2:30 PM in [place]  Discharge Diagnoses: Principal Problem:   Acute on chronic heart failure with preserved ejection fraction (HFpEF) (HCC) Active Problems:   COPD with acute exacerbation (HCC)   Acute respiratory failure with hypoxia (HCC)   Type 2 diabetes mellitus without complications (HCC)   Hypothyroidism   Essential hypertension   Hypokalemia   BPH (benign prostatic hyperplasia)   Iron deficiency anemia   GERD without esophagitis   Morbid obesity (HCC)   Acquired hypothyroidism   Dyslipidemia   CKD stage 3a, GFR 45-59 ml/min (HCC)   Moderate aortic stenosis   Acute urinary retention  Resolved Problems:   * No resolved hospital problems. [facility] Course: Shane Chambers is a 62 y.o. male with medical history significant for coronary artery disease status post CABG, COPD, depression, type II diabetes mellitus, GERD, hypertension, dyslipidemia and hypothyroidism as well as ischemic cardiomyopathy and peripheral vascular disease s/p right BKA, who presented to the emergency room with acute onset of worsening dyspnea with associated increasing cough and wheezing over the last several days.  He has a mild elevation of BNP chest x-ray showed vascular congestion.  He was started on IV Lasix.  Patient also had some bronchospasm, started on bronchodilator.  Assessment and Plan:    Acute on chronic heart failure with preserved ejection fraction -Moderate aortic stenosis -Presented with CHF exacerbation, started on Lasix 40 mg IV twice daily -Cardiology was consulted, also received albumin 25 g -At this time patient back to baseline, IV Lasix has been discontinued due to worsening renal  function -Patient was started on torsemide 20 mg p.o. daily per cardiology -Not a candidate for SGLT2 inhibitor due to indwelling Foley catheter   COPD with acute exacerbation -Stable   BPH with urinary retention -Patient had Foley catheter placed 2 months ago -Continue Flomax   CKD stage IIIa -Creatinine 1.70.  IV Lasix has been discontinued  -Will be discharged on torsemide -Follow-up BMP in a.m.   Hypothyroidism Continue Synthroid.   Type 2 diabetes mellitus without complications (HCC) Glucose controlled -Continue sliding scale insulin with NovoLog   Iron deficiency anemia. Received IV iron -Hemoglobin is 8.9   Dyslipidemia continue statin therapy.   GERD without esophagitis PPI.      Consultants: Cardiology Procedures performed:   Disposition: Skilled nursing facility Diet recommendation:  Discharge Diet Orders (From admission, onward)     Start     Ordered   04/02/23 0000  Diet - low sodium heart healthy        04/02/23 1107           Carb modified diet DISCHARGE MEDICATION: Allergies as of 04/02/2023       Reactions   Ace Inhibitors Swelling, Cough   (Not on MAR at [name])   Haloperidol And Related    Do NOT give anti-psychotics due to risk of  Torsades and QT prolongation (per Dr. Teena Irani request)   Other Itching   [name]   Shellfish Allergy    Listed on MAR   Penicillins Itching, Rash   Has patient had a PCN reaction causing immediate rash, facial/tongue/throat swelling, SOB or lightheadedness with hypotension: Unknown Has patient had  a PCN reaction causing severe rash involving mucus membranes or skin necrosis: Unknown Has patient had a PCN reaction that required hospitalization: Unknown Has patient had a PCN reaction occurring within the last 10 years: Unknown If all of the above answers are "NO", then may proceed with Cephalosporin use. Tolerated ancef (12-17-22)        Medication List      STOP taking these medications    ascorbic acid 500 MG tablet Commonly known as: VITAMIN C   Cholecalciferol 125 MCG (5000 UT) Tabs   metoprolol tartrate 50 MG tablet Commonly known as: LOPRESSOR   sodium bicarbonate 650 MG tablet       TAKE these medications    acetaminophen 500 MG tablet Commonly known as: TYLENOL Take 1,000 mg by mouth every 8 (eight) hours as needed for fever or moderate pain.   albuterol 108 (90 Base) MCG/ACT inhaler Commonly known as: VENTOLIN HFA Inhale 2 puffs into the lungs every 6 (six) hours as needed for wheezing or shortness of breath.   amLODipine 10 MG tablet Commonly known as: NORVASC Take 1 tablet (10 mg total) by mouth daily.   aspirin EC 81 MG tablet Take 1 tablet (81 mg total) by mouth daily with breakfast.   atorvastatin 40 MG tablet Commonly known as: LIPITOR Take 1 tablet (40 mg total) by mouth at bedtime.   budesonide-formoterol 160-4.5 MCG/ACT inhaler Commonly known as: Symbicort Inhale 2 puffs into the lungs in the morning and at bedtime.   carvedilol 25 MG tablet Commonly known as: COREG Take 1 tablet (25 mg total) by mouth 2 (two) times daily with a meal.   ferrous gluconate 324 MG tablet Commonly known as: FERGON Take 324 mg by mouth daily.   gabapentin 300 MG capsule Commonly known as: NEURONTIN Take 1 capsule (300 mg total) by mouth at bedtime.   Glucerna Liqd Take 1 Can by mouth with breakfast, with lunch, and with evening meal.   hydrALAZINE 100 MG tablet Commonly known as: APRESOLINE Take 1 tablet (100 mg total) by mouth every 8 (eight) hours.   ipratropium-albuterol 0.5-2.5 (3) MG/3ML Soln Commonly known as: DUONEB Take 3 mLs by nebulization every 6 (six) hours as needed. What changed: reasons to take this   levothyroxine 75 MCG tablet Commonly known as: SYNTHROID Take 75 mcg by mouth daily before breakfast.   mirtazapine 30 MG tablet Commonly known as: REMERON Take 30 mg by mouth at  bedtime.   multivitamin tablet Take 1 tablet by mouth daily.   NovoLOG FlexPen 100 UNIT/ML FlexPen Generic drug: insulin aspart 0-15 Units, Subcutaneous, 3 times daily with meals CBG < 70: implement hypoglycemia protocol-call MD CBG 70 - 120: 0 units CBG 121 - 150: 2 units CBG 151 - 200: 3 units CBG 201 - 250: 5 units CBG 251 - 300: 8 units CBG 301 - 350: 11 units CBG 351 - 400: 15 units CBG > 400:   omeprazole 40 MG capsule Commonly known as: PRILOSEC Take 40 mg by mouth in the morning.   polyethylene glycol 17 g packet Commonly known as: MIRALAX / GLYCOLAX Take 17 g by mouth daily as needed for moderate constipation.   polyvinyl alcohol 1.4 % ophthalmic solution Commonly known as: LIQUIFILM TEARS Place 1 drop into both eyes daily.   tamsulosin 0.4 MG Caps capsule Commonly known as: FLOMAX Take 0.8 mg by mouth every evening.   thiamine 100 MG tablet Commonly known as: Vitamin B-1 Take 1 tablet (100 mg total) by  mouth daily.   torsemide 20 MG tablet Commonly known as: DEMADEX Take 1 tablet (20 mg total) by mouth daily. Start taking on: Apr 03, 2023   venlafaxine XR 75 MG 24 hr capsule Commonly known as: EFFEXOR-XR Take 225 mg by mouth daily.   Vitamin D (Ergocalciferol) 1.25 MG (50000 UNIT) Caps capsule Commonly known as: DRISDOL Take 50,000 Units by mouth every 7 (seven) days. (Mondays)        Follow-up Information     Ellsworth Lennox, PA-C Follow up on 04/10/2023.   Specialties: Cardiology, Cardiology Why: 2:30 PM - Dr. Verna Czech PA Contact information: [address] [place] [address] [phone]         [name], MD Follow up in 2 week(s).   Specialty: Internal Medicine Contact information: [address] [place] [address] [phone]                Discharge Exam: Ceasar Mons Weights   03/30/23 5784 03/31/23 0700 04/01/23 0716  Weight: 96 kg 94.4 kg 92.8 kg   General-appears in no acute distress Heart-S1-S2, regular, no  murmur auscultated Lungs-clear to auscultation bilaterally, no wheezing or crackles auscultated Abdomen-soft, nontender, no organomegaly Extremities-no edema in the lower extremities Neuro-alert, oriented x3, no focal deficit noted  Condition at discharge: good  The results of significant diagnostics from this hospitalization (including imaging, microbiology, ancillary and laboratory) are listed below for reference.   Imaging Studies: DG Chest Port 1 View  Result Date: 03/29/2023 CLINICAL DATA:  Cough EXAM: PORTABLE CHEST 1 VIEW COMPARISON:  03/26/2023 FINDINGS: Single frontal view of the chest demonstrates postsurgical changes from median sternotomy. The cardiac silhouette is enlarged. Chronic elevation of the right hemidiaphragm. Chronic central vascular congestion without airspace disease, effusion, or pneumothorax. IMPRESSION: 1. Chronic central vascular congestion.  No overt edema. 2. No acute airspace disease. Electronically Signed   By: Sharlet Salina M.D.   On: 03/29/2023 15:55   DG Chest Portable 1 View  Result Date: 03/26/2023 CLINICAL DATA:  Shortness of breath EXAM: PORTABLE CHEST 1 VIEW COMPARISON:  02/16/2023 FINDINGS: Post sternotomy changes. Cardiomegaly with central vascular congestion and probable small pleural effusions. No focal airspace disease or pneumothorax. IMPRESSION: Cardiomegaly with central vascular congestion and probable small pleural effusions. Electronically Signed   By: Jasmine Pang M.D.   On: 03/26/2023 23:52    Microbiology: Results for orders placed or performed during the hospital encounter of 03/26/23  SARS Coronavirus 2 by RT PCR (hospital order, performed in [facility] hospital lab) *cepheid single result test* Anterior Nasal Swab     Status: None   Collection Time: 03/26/23 11:38 PM   Specimen: Anterior Nasal Swab  Result Value Ref Range Status   SARS Coronavirus 2 by RT PCR NEGATIVE NEGATIVE Final    Comment: (NOTE) SARS-CoV-2 target nucleic  acids are NOT DETECTED.  The SARS-CoV-2 RNA is generally detectable in upper and lower respiratory specimens during the acute phase of infection. The lowest concentration of SARS-CoV-2 viral copies this assay can detect is 250 copies / mL. A negative result does not preclude SARS-CoV-2 infection and should not be used as the sole basis for treatment or other patient management decisions.  A negative result may occur with improper specimen collection / handling, submission of specimen other than nasopharyngeal swab, presence of viral mutation(s) within the areas targeted by this assay, and inadequate number of viral copies (<250 copies / mL). A negative result must be combined with clinical observations, patient history, and epidemiological  information.  Fact Sheet for Patients:   RoadLapTop.co.za  Fact Sheet for Healthcare Providers: http://kim-miller.com/  This test is not yet approved or  cleared by the [place] FDA and has been authorized for detection and/or diagnosis of SARS-CoV-2 by FDA under an Emergency Use Authorization (EUA).  This EUA will remain in effect (meaning this test can be used) for the duration of the COVID-19 declaration under Section 564(b)(1) of the Act, 21 U.S.C. section 360bbb-3(b)(1), unless the authorization is terminated or revoked sooner.  Performed at [facility], [address] [place], [place] [address]   MRSA Next Gen by PCR, Nasal     Status: Abnormal   Collection Time: 03/27/23  1:45 PM   Specimen: Nasal Mucosa; Nasal Swab  Result Value Ref Range Status   MRSA by PCR Next Gen DETECTED (A) NOT DETECTED Final    Comment: RESULT CALLED TO, READ BACK BY AND VERIFIED WITH: PAULA HENRIQUEZ AT 0009 ON 03/28/23 BY SS (NOTE) The GeneXpert MRSA Assay (FDA approved for NASAL specimens only), is one component of a comprehensive MRSA colonization surveillance program. It is not intended to  diagnose MRSA infection nor to guide or monitor treatment for MRSA infections. Test performance is not FDA approved in patients less than 63 years old. Performed at [facility], [address] [place], [place] [address]     Labs: CBC: Recent Labs  Lab 03/26/23 2322 03/27/23 0508 03/28/23 0455 03/29/23 0431 03/31/23 0907  WBC 8.5 6.9 11.7* 13.2* 11.7*  NEUTROABS 6.2  --   --   --   --   HGB 9.4* 9.7* 8.6* 9.6* 8.9*  HCT 29.4* 30.7* 27.7* 30.4* 27.9*  MCV 89.1 88.5 88.8 88.9 87.7  PLT 305 312 324 376 393   Basic Metabolic Panel: Recent Labs  Lab 03/26/23 2322 03/27/23 0508 03/28/23 0455 03/29/23 0431 03/30/23 0527 03/31/23 0603 04/01/23 0720 04/02/23 0625  NA 139   < > 142 140 140 140 141 140  K 3.2*   < > 4.2 4.4 4.0 3.8 4.2 4.2  CL 106   < > 111 108 104 105 105 105  CO2 24   < > 24 24 25 26 26 26   GLUCOSE 122*   < > 118* 101* 101* 89 92 96  BUN 33*   < > 52* 52* 52* 53* 56* 61*  CREATININE 1.54*   < > 1.74* 1.58* 1.57* 1.56* 1.66* 1.70*  CALCIUM 8.6*   < > 8.8* 9.2 8.9 9.1 9.2 8.9  MG 1.7  --  2.0  --  1.7 1.8 1.9  --    < > = values in this interval not displayed.   Liver Function Tests: Recent Labs  Lab 03/26/23 2322  AST 28  ALT 24  ALKPHOS 124  BILITOT 0.7  PROT 7.3  ALBUMIN 2.8*   CBG: Recent Labs  Lab 04/01/23 0844 04/01/23 1217 04/01/23 1713 04/01/23 2104 04/02/23 0805  GLUCAP 89 166* 134* 117* 88    Discharge time spent: greater than 30 minutes.  Signed: Meredeth Ide, MD [facility] 04/02/2023

## 2023-04-02 NOTE — Progress Notes (Signed)
OT Cancellation Note  Patient Details Name: CHIMA ESPERICUETA MRN: [id number] DOB: November 18, 1960   Cancelled Treatment:    Reason Eval/Treat Not Completed: Patient declined, no reason specified (pt stating he's leaving today)  Alvester Morin 04/02/2023, 10:32 AM

## 2023-04-02 NOTE — Care Management Important Message (Signed)
Important Message  Patient Details  Name: Shane Chambers MRN: [id number] Date of Birth: 10-Dec-1960   Medicare Important Message Given:  Yes     [name] 04/02/2023, 1:29 PM

## 2023-04-09 ENCOUNTER — Encounter: Payer: Medicare Other | Admitting: Family

## 2023-04-10 ENCOUNTER — Ambulatory Visit: Payer: Medicare Other | Attending: Student | Admitting: Student

## 2023-04-10 ENCOUNTER — Encounter: Payer: Self-pay | Admitting: Student

## 2023-04-10 VITALS — BP 100/62 | HR 72 | Ht 69.0 in | Wt 206.8 lb

## 2023-04-10 DIAGNOSIS — I350 Nonrheumatic aortic (valve) stenosis: Secondary | ICD-10-CM | POA: Diagnosis present

## 2023-04-10 DIAGNOSIS — N1831 Chronic kidney disease, stage 3a: Secondary | ICD-10-CM

## 2023-04-10 DIAGNOSIS — D509 Iron deficiency anemia, unspecified: Secondary | ICD-10-CM

## 2023-04-10 DIAGNOSIS — I10 Essential (primary) hypertension: Secondary | ICD-10-CM | POA: Diagnosis present

## 2023-04-10 DIAGNOSIS — E785 Hyperlipidemia, unspecified: Secondary | ICD-10-CM

## 2023-04-10 DIAGNOSIS — I2510 Atherosclerotic heart disease of native coronary artery without angina pectoris: Secondary | ICD-10-CM

## 2023-04-10 DIAGNOSIS — I5032 Chronic diastolic (congestive) heart failure: Secondary | ICD-10-CM | POA: Diagnosis present

## 2023-04-10 NOTE — Progress Notes (Signed)
Cardiology Office Note    Date:  04/10/2023  ID:  Shane Chambers, DOB 22-Jun-1961, MRN [id number] Cardiologist: Dina Rich, MD    History of Present Illness:    Shane Chambers is a 62 y.o. male with past medical history of CAD (s/p CABG in 03/2020 with LIMA-LAD, RIMA-PL, RA-D1-RI-OM1), HFimpEF (EF 20-25% by echo in 02/2020, at 40% by echo in 03/2020, normalized to 60-65% by echo in 04/2020), Torsades/QT prolongation (occurring in 09/2022 and felt to be secondary to medications at that time), aortic stenosis, HTN, HLD, Type 2 DM, Stage 3 CKD anemia and right BKA who presents to the office today for hospital follow-up.  He most recently presented to [facility] on 03/26/2023 for evaluation of worsening dyspnea on exertion and was found to have an acute CHF exacerbation (BNP 891). His Torsemide had previously been held on admission in 02/2023 for an AKI in the setting of diarrhea and vomiting.  He responded well to IV Lasix during admission with an overall output of -21 L and weight down to 204 lbs at the time of discharge. Was restarted on Torsemide 20 mg daily with instructions to take an extra 20 mg if needed for weight gain greater than 2 pounds in 24 hours. He was not on an MRA given history of hyperkalemia and AKI and was not felt to be an ideal SGLT2 inhibitor candidate given his Foley catheter and relatively bed-bound status. Was discharged back to SNF on 04/02/2023.  In talking with the patient today, he reports his most significant issue at this time is worsening pain along his Foley catheter site and this has been occurring for the past few months. He does have follow-up with Urology scheduled for next week. Reports the pain is not worse with urination but has been present since placement. In regards to his respiratory status, he denies any specific dyspnea on exertion, orthopnea or PND. No recent lower extremity edema, chest pain or persistent palpitations. He did miss an appointment with GI  during his recent hospitalization and this has not yet been rescheduled (will send a note to GI today to follow-up on this).   Studies Reviewed:   EKG: EKG is not ordered today.   R/LHC: 02/2020 Prox RCA lesion is 99% stenosed. Prox RCA to Mid RCA lesion is 40% stenosed. Dist RCA lesion is 30% stenosed. Prox LAD to Mid LAD lesion is 80% stenosed. 1st Diag lesion is 60% stenosed. Mid LM to Dist LM lesion is 40% stenosed. Prox Cx to Mid Cx lesion is 95% stenosed.   1. 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 Moderately elevated LV/right heart filling pressures c/w known diagnosis of heart failure 3. Known severe LV dysfunction by echo   Recommend: review revascularization options. Pt has surgical anatomy, but not sure he will be a candidate for CABG. High-risk multivessel PCI could be considered if he is not a candidate for surgery.  Limited Echocardiogram: 02/2023 IMPRESSIONS     1. Left ventricular ejection fraction, by estimation, is 60 to 65%. The  left ventricle has normal function. Left ventricular endocardial border  not optimally defined to evaluate regional wall motion. There is moderate  left ventricular hypertrophy.   2. Right ventricular systolic function is normal. The right ventricular  size is normal.   3. The aortic valve has an indeterminant number of cusps. There is mild  calcification of  the aortic valve. There is mild thickening of the aortic  valve. Aortic valve regurgitation is not visualized. Moderate aortic valve  stenosis. Aortic valve mean  gradient measures 20.7 mmHg. Aortic valve peak gradient measures 35.3  mmHg. Aortic valve area, by VTI measures 1.17 cm.    Physical Exam:   VS:  BP 100/62   Pulse 72   Ht 5\' 9"  (1.753 m)   Wt 206 lb 12.8 oz (93.8 kg)   SpO2 98%   BMI 30.54 kg/m    Wt Readings from Last 3  Encounters:  04/10/23 206 lb 12.8 oz (93.8 kg)  04/01/23 204 lb 9.4 oz (92.8 kg)  02/20/23 220 lb 7.4 oz (100 kg)     GEN: Well nourished, well developed male appearing in no acute distress. Sitting in wheelchair.  NECK: No JVD; No carotid bruits CARDIAC: RRR, 2/6 SEM along RUSB RESPIRATORY:  Clear to auscultation without rales, wheezing or rhonchi  ABDOMEN: Appears non-distended. No obvious abdominal masses. EXTREMITIES: No pitting edema.  Right BKA.    Assessment and Plan:   1. HFpEF - His weight remains stable and has only fluctuated by 2 pounds since hospital discharge. He appears euvolemic by examination today. Will plan to continue current diuretic therapy with Torsemide 20 mg daily and recheck a BMET for reassessment of his electrolytes and renal function. Would not use an SGLT2 inhibitor at this time given his wheelchair/bed-bound status and current Foley catheter.  2. CAD - He underwent CABG in 03/2020 with LIMA-LAD, RIMA-PL, RA-D1-RI-OM1. He denies any recent anginal symptoms. - Continue current medical therapy with ASA 81 mg daily, Coreg 25 mg twice daily and Atorvastatin 40 mg daily.  3. Aortic Stenosis - This was moderate by echocardiogram in 02/2023. Would anticipate repeat imaging next year unless clinically indicated in the interim.  4. HTN - His blood pressure is soft at 100/62 during today's visit but he denies any associated symptoms. He is currently on Coreg 25 mg twice daily, Hydralazine 100 mg TID and Torsemide 20 mg daily with Amlodipine recently being discontinued while at [facility]. If BP remains soft, can reduce Hydralazine to 75 mg TID.  5. HLD - LDL was at 61 when checked earlier this month. Continue current medical therapy with Atorvastatin 40 mg daily.  6. Stage 3 CKD - Baseline creatinine appears to be around 1.5. Creatinine was at 1.70 by labs on 04/02/2023 but previously peaked at 7.33 in 02/2023 while admitted for an AKI. Will recheck a BMET for  reassessment of his renal function and electrolytes.  7. Anemia - His hemoglobin was at 8.9 by most recent check on 03/31/2023 and he remains on iron supplementation. No reports of active bleeding. Will recheck a CBC.    Signed, Ellsworth Lennox, PA-C

## 2023-04-10 NOTE — Patient Instructions (Signed)
Medication Instructions:  Your physician recommends that you continue on your current medications as directed. Please refer to the Current Medication list given to you today.  *If you need a refill on your cardiac medications before your next appointment, please call your pharmacy*   Lab Work: CBC BMET  If you have labs (blood work) drawn today and your tests are completely normal, you will receive your results only by: MyChart Message (if you have MyChart) OR A paper copy in the mail If you have any lab test that is abnormal or we need to change your treatment, we will call you to review the results.   Testing/Procedures: Your physician recommends that you continue on your current medications as directed. Please refer to the Current Medication list given to you today.    Follow-Up: At [facility], you and your health needs are our priority.  As part of our continuing mission to provide you with exceptional heart care, we have created designated Provider Care Teams.  These Care Teams include your primary Cardiologist (physician) and Advanced Practice Providers (APPs -  Physician Assistants and Nurse Practitioners) who all work together to provide you with the care you need, when you need it.  We recommend signing up for the patient portal called "MyChart".  Sign up information is provided on this After Visit Summary.  MyChart is used to connect with patients for Virtual Visits (Telemedicine).  Patients are able to view lab/test results, encounter notes, upcoming appointments, etc.  Non-urgent messages can be sent to your provider as well.   To learn more about what you can do with MyChart, go to [email or web address].    Your next appointment:   3-4 month(s)  Provider:   Dina Rich, MD    Other Instructions

## 2023-04-16 ENCOUNTER — Other Ambulatory Visit: Payer: Self-pay

## 2023-04-16 ENCOUNTER — Encounter (HOSPITAL_COMMUNITY): Payer: Self-pay | Admitting: Emergency Medicine

## 2023-04-16 ENCOUNTER — Encounter: Payer: Self-pay | Admitting: Urology

## 2023-04-16 ENCOUNTER — Ambulatory Visit: Payer: Medicare Other

## 2023-04-16 ENCOUNTER — Emergency Department (HOSPITAL_COMMUNITY)
Admission: EM | Admit: 2023-04-16 | Discharge: 2023-04-17 | Disposition: A | Discharge status: Home / Self Care | Payer: Medicare Other | Attending: Emergency Medicine | Admitting: Emergency Medicine

## 2023-04-16 ENCOUNTER — Ambulatory Visit (INDEPENDENT_AMBULATORY_CARE_PROVIDER_SITE_OTHER): Payer: Medicare Other | Admitting: Urology

## 2023-04-16 VITALS — BP 107/64 | HR 72 | Ht 69.0 in | Wt 206.8 lb

## 2023-04-16 DIAGNOSIS — I5030 Unspecified diastolic (congestive) heart failure: Secondary | ICD-10-CM | POA: Insufficient documentation

## 2023-04-16 DIAGNOSIS — Z794 Long term (current) use of insulin: Secondary | ICD-10-CM | POA: Diagnosis not present

## 2023-04-16 DIAGNOSIS — R339 Retention of urine, unspecified: Secondary | ICD-10-CM | POA: Diagnosis not present

## 2023-04-16 DIAGNOSIS — J449 Chronic obstructive pulmonary disease, unspecified: Secondary | ICD-10-CM | POA: Insufficient documentation

## 2023-04-16 DIAGNOSIS — Z85048 Personal history of other malignant neoplasm of rectum, rectosigmoid junction, and anus: Secondary | ICD-10-CM | POA: Insufficient documentation

## 2023-04-16 DIAGNOSIS — Z7982 Long term (current) use of aspirin: Secondary | ICD-10-CM | POA: Diagnosis not present

## 2023-04-16 DIAGNOSIS — I130 Hypertensive heart and chronic kidney disease with heart failure and stage 1 through stage 4 chronic kidney disease, or unspecified chronic kidney disease: Secondary | ICD-10-CM | POA: Diagnosis not present

## 2023-04-16 DIAGNOSIS — I2510 Atherosclerotic heart disease of native coronary artery without angina pectoris: Secondary | ICD-10-CM | POA: Diagnosis not present

## 2023-04-16 DIAGNOSIS — N189 Chronic kidney disease, unspecified: Secondary | ICD-10-CM | POA: Insufficient documentation

## 2023-04-16 DIAGNOSIS — I5020 Unspecified systolic (congestive) heart failure: Secondary | ICD-10-CM | POA: Insufficient documentation

## 2023-04-16 DIAGNOSIS — Z79899 Other long term (current) drug therapy: Secondary | ICD-10-CM | POA: Diagnosis not present

## 2023-04-16 DIAGNOSIS — E1122 Type 2 diabetes mellitus with diabetic chronic kidney disease: Secondary | ICD-10-CM | POA: Diagnosis not present

## 2023-04-16 DIAGNOSIS — Z951 Presence of aortocoronary bypass graft: Secondary | ICD-10-CM | POA: Diagnosis not present

## 2023-04-16 LAB — CBC WITH DIFFERENTIAL/PLATELET
Abs Immature Granulocytes: 0.08 10*3/uL — ABNORMAL HIGH (ref 0.00–0.07)
Basophils Absolute: 0.1 10*3/uL (ref 0.0–0.1)
Basophils Relative: 1 %
Eosinophils Absolute: 0.6 10*3/uL — ABNORMAL HIGH (ref 0.0–0.5)
Eosinophils Relative: 6 %
HCT: 28.2 % — ABNORMAL LOW (ref 39.0–52.0)
Hemoglobin: 8.9 g/dL — ABNORMAL LOW (ref 13.0–17.0)
Immature Granulocytes: 1 %
Lymphocytes Relative: 14 %
Lymphs Abs: 1.3 10*3/uL (ref 0.7–4.0)
MCH: 28.1 pg (ref 26.0–34.0)
MCHC: 31.6 g/dL (ref 30.0–36.0)
MCV: 89 fL (ref 80.0–100.0)
Monocytes Absolute: 0.9 10*3/uL (ref 0.1–1.0)
Monocytes Relative: 10 %
Neutro Abs: 6.5 10*3/uL (ref 1.7–7.7)
Neutrophils Relative %: 68 %
Platelets: 328 10*3/uL (ref 150–400)
RBC: 3.17 MIL/uL — ABNORMAL LOW (ref 4.22–5.81)
RDW: 13.9 % (ref 11.5–15.5)
WBC: 9.5 10*3/uL (ref 4.0–10.5)
nRBC: 0 % (ref 0.0–0.2)

## 2023-04-16 LAB — BASIC METABOLIC PANEL
Anion gap: 10 (ref 5–15)
BUN: 42 mg/dL — ABNORMAL HIGH (ref 6–20)
CO2: 25 mmol/L (ref 22–32)
Calcium: 8.9 mg/dL (ref 8.9–10.3)
Chloride: 103 mmol/L (ref 98–111)
Creatinine, Ser: 1.7 mg/dL — ABNORMAL HIGH (ref 0.61–1.24)
GFR, Estimated: 46 mL/min — ABNORMAL LOW (ref >60)
Glucose, Bld: 109 mg/dL — ABNORMAL HIGH (ref 70–99)
Potassium: 3.8 mmol/L (ref 3.5–5.1)
Sodium: 138 mmol/L (ref 135–145)

## 2023-04-16 MED ORDER — CEPHALEXIN 250 MG PO CAPS
500.0000 mg | ORAL_CAPSULE | Freq: Once | ORAL | Status: AC
Start: 2023-04-16 — End: 2023-04-16
  Administered 2023-04-16: 500 mg via ORAL

## 2023-04-16 MED ORDER — LIDOCAINE HCL URETHRAL/MUCOSAL 2 % EX GEL
1.0000 | Freq: Once | CUTANEOUS | Status: AC
  Administered 2023-04-16: 1 via URETHRAL
  Filled 2023-04-16: qty 10

## 2023-04-16 NOTE — Discharge Instructions (Signed)
Thank you for coming to [facility]. You were seen for acute urinary retention. We did an exam, labs, and replaced the foley.  Please follow up with your urologist Dr. Annabell Howells tomorrow and again within 1 week as originally planned.    Do not hesitate to return to the ED or call 911 if you experience: -Worsening symptoms -Foley bag not draining -Blood clots in foley bag -Lightheadedness, passing out -Fevers/chills -Anything else that concerns you

## 2023-04-16 NOTE — ED Triage Notes (Signed)
Pt from [facility] with c/o of urinary retention. Pt previously had a foley cath for 2 months, seen urologist today and foley cath was removed. Was told if pt had not urinated by 5pm today so seek healthcare. Pt has not urinated since foley cath was removed earlier today. NAD

## 2023-04-16 NOTE — Progress Notes (Signed)
Catheter Removal  Patient is present today for a catheter removal.  10ml of water was drained from the balloon. A 16FR foley cath was removed from the bladder, no complications were noted. Patient tolerated well.  Performed by: Guss Bunde, CMA  Follow up/ Additional notes: Return in about 1 year (around 04/15/2024) for He needs to return tomorrow for a PVR and then in a week.  Can see [facility] Dec. .    Urine sent for culture per MD order.

## 2023-04-16 NOTE — ED Provider Notes (Signed)
[place] EMERGENCY DEPARTMENT AT [facility] Provider Note   CSN: [id number] Arrival date & time: 04/16/23  1936     History  Chief Complaint  Patient presents with   Urinary Retention    Shane Chambers is a 62 y.o. male with rectal cancer, systolic/diastolic CHF, CAD s/p CABG x5, COPD, NICM, HTN, BPH, T2DM, CKD, aortic stenosis, obesity, s/p R BKA, prolonged QT, hypothyroidism, who presents with acute urinary retention.   Patient presents from [facility] with c/o of urinary retention. Pt previously had a foley cath for 2 months for BPH, was seen urologist today and foley cath was removed with plan for trial and repeat PVR tomorrow in clinic. Was told if pt had not urinated by 5pm today so seek healthcare. Pt has not urinated since foley cath was removed earlier today. He has no abd pain or flank pain, just abdominal fullness from not urinating. Per chart review, urology drew culture from catheter and placed patient on cipro 250 mg BID with plan for PVR tomorrow.  Patient also states he was given a dose of Keflex in the office today.  He has had no fevers or chills, testicular or scrotal pain.  Was recently admitted from 03/26/2023 to 04/02/2023 for heart failure exacerbation.   HPI     Home Medications Prior to Admission medications   Medication Sig Start Date End Date Taking? Authorizing Provider  acetaminophen (TYLENOL) 500 MG tablet Take 1,000 mg by mouth every 8 (eight) hours as needed for fever or moderate pain.    [provider]  albuterol (VENTOLIN HFA) 108 (90 Base) MCG/ACT inhaler Inhale 2 puffs into the lungs every 6 (six) hours as needed for wheezing or shortness of breath. Patient not taking: Reported on 04/16/2023 05/02/22   Cleora Fleet, MD  aspirin 81 MG EC tablet Take 1 tablet (81 mg total) by mouth daily with breakfast. 11/29/20   Shon Hale, MD  atorvastatin (LIPITOR) 40 MG tablet Take 1 tablet (40 mg total) by mouth at  bedtime. 11/28/22   Gillis Santa, MD  budesonide-formoterol [facility]) 160-4.5 MCG/ACT inhaler Inhale 2 puffs into the lungs in the morning and at bedtime. 05/24/20   Oretha Milch, MD  carvedilol (COREG) 25 MG tablet Take 1 tablet (25 mg total) by mouth 2 (two) times daily with a meal. 04/02/23   Meredeth Ide, MD  ferrous gluconate (FERGON) 324 MG tablet Take 324 mg by mouth daily.    [provider]  gabapentin (NEURONTIN) 300 MG capsule Take 1 capsule (300 mg total) by mouth at bedtime. 02/19/23   Vassie Loll, MD  Glucerna [facility]) LIQD Take 1 Can by mouth with breakfast, with lunch, and with evening meal.    [provider]  hydrALAZINE (APRESOLINE) 100 MG tablet Take 1 tablet (100 mg total) by mouth every 8 (eight) hours. 04/02/23   Meredeth Ide, MD  insulin aspart (NOVOLOG FLEXPEN) 100 UNIT/ML FlexPen 0-15 Units, Subcutaneous, 3 times daily with meals CBG < 70: implement hypoglycemia protocol-call MD CBG 70 - 120: 0 units CBG 121 - 150: 2 units CBG 151 - 200: 3 units CBG 201 - 250: 5 units CBG 251 - 300: 8 units CBG 301 - 350: 11 units CBG 351 - 400: 15 units CBG > 400: 10/17/22   Ghimire, Werner Lean, MD  ipratropium-albuterol (DUONEB) 0.5-2.5 (3) MG/3ML SOLN Take 3 mLs by nebulization every 6 (six) hours as needed. Patient taking differently: Take 3 mLs by  nebulization every 6 (six) hours as needed (SOB/wheezing). 10/17/22   Ghimire, Werner Lean, MD  levothyroxine (SYNTHROID) 75 MCG tablet Take 75 mcg by mouth daily before breakfast.    [provider]  mirtazapine (REMERON) 30 MG tablet Take 30 mg by mouth at bedtime.    [provider]  Multiple Vitamin (MULTIVITAMIN) tablet Take 1 tablet by mouth daily.    [provider]  omeprazole (PRILOSEC) 40 MG capsule Take 40 mg by mouth in the morning. 01/01/22   [provider]  polyethylene glycol (MIRALAX / GLYCOLAX) 17 g packet Take 17 g by mouth daily as needed for moderate  constipation.    [provider]  polyvinyl alcohol (LIQUIFILM TEARS) 1.4 % ophthalmic solution Place 1 drop into both eyes daily.    [provider]  tamsulosin (FLOMAX) 0.4 MG CAPS capsule Take 0.8 mg by mouth every evening.    [provider]  thiamine (VITAMIN B-1) 100 MG tablet Take 1 tablet (100 mg total) by mouth daily. 10/18/22   Ghimire, Werner Lean, MD  torsemide (DEMADEX) 20 MG tablet Take 1 tablet (20 mg total) by mouth daily. 04/03/23   Meredeth Ide, MD  venlafaxine XR (EFFEXOR-XR) 75 MG 24 hr capsule Take 225 mg by mouth daily. 03/11/23   [provider]  Vitamin D, Ergocalciferol, (DRISDOL) 1.25 MG (50000 UNIT) CAPS capsule Take 50,000 Units by mouth every 7 (seven) days. (Mondays)    [provider]      Allergies    Ace inhibitors, Haloperidol and related, Other, Shellfish allergy, and Penicillins    Review of Systems   Review of Systems Review of systems Negative for f/c.  A 10 point review of systems was performed and is negative unless otherwise reported in HPI.  Physical Exam Updated Vital Signs BP (!) 155/70   Pulse 79   Temp 98.2 F (36.8 C) (Oral)   Resp 17   Ht 5\' 9"  (1.753 m)   Wt 93 kg   SpO2 97%   BMI 30.27 kg/m  Physical Exam General: Chronically ill but non-toxic appearing male, lying in bed.  HEENT: Sclera anicteric, MMM, trachea midline.  Cardiology: RRR, no murmurs/rubs/gallops. BL radial and DP pulses equal bilaterally.  Resp: Normal respiratory rate and effort.  Abd: Lower abdominal fullness. Soft, non-tender, non-distended. No rebound tenderness or guarding.  GU: Mild hypospadias. 1cm ulcer noted just inside urethra. No surrounding erythema, induration, fluctuance, or swelling. MSK: No peripheral edema or signs of trauma. Extremities without deformity or TTP. Skin: warm, dry.  Back: No CVA tenderness Neuro: A&Ox4, CNs II-XII grossly intact. MAEs. Sensation grossly intact.  Psych: Normal mood and affect.    ED Results / Procedures / Treatments   Labs (all labs ordered are listed, but only abnormal results are displayed) Labs Reviewed  CBC WITH DIFFERENTIAL/PLATELET - Abnormal; Notable for the following components:      Result Value   RBC 3.17 (*)    Hemoglobin 8.9 (*)    HCT 28.2 (*)    Eosinophils Absolute 0.6 (*)    Abs Immature Granulocytes 0.08 (*)    All other components within normal limits  BASIC METABOLIC PANEL - Abnormal; Notable for the following components:   Glucose, Bld 109 (*)    BUN 42 (*)    Creatinine, Ser 1.70 (*)    GFR, Estimated 46 (*)    All other components within normal limits    EKG None  Radiology No results found.  Procedures  Procedures    Medications Ordered in ED Medications  lidocaine (XYLOCAINE) 2 % jelly 1 Application (1 Application Urethral Given 04/16/23 2317)    ED Course/ Medical Decision Making/ A&P                          Medical Decision Making Amount and/or Complexity of Data Reviewed Labs: ordered. Decision-making details documented in ED Course.    This patient presents to the ED for concern of acute urinary retention, this involves an extensive number of treatment options, and is a complaint that carries with it a high risk of complications and morbidity.  Pt HDS and afebrile overall well-appearing.  MDM:    Ddx for urinary retention includes but is not limited to:  Obstructive causes, most likely BPH given patient's history. No prior h/o bladder tumor/mass. No abd pain or flank pain, low c/f stone (nephrolithiasis, ureterolithiasis). Considered infection (UTI, Pyelonephritis, Prostatitis), however already being treated w/ urology, and do not believe we need to redraw UA or urine culture as one was drawn earlier today w/ urology and already taking cipro 250 mg BID. No hematuria noted to indicate blood clot. Already planning on returning to urology tomorrow and then again in a week for f/u appt. Will draw lab to ensure no  electrolyte derangements/renal injury or leukocytosis and will place a foley catheter.    Clinical Course as of 04/17/23 0036  Thu Apr 16, 2023  2230 Creatinine(!): 1.70 Stable [HN]  2230 Hemoglobin(!): 8.9 Stable [HN]  2230 WBC: 9.5 No leukocytosis  [HN]  2356 Small ulcers visualized on  [HN]    Clinical Course User Index [HN] Loetta Rough, MD    Labs: I Ordered, and personally interpreted labs.  The pertinent results include:  those listed above  Additional history obtained from chart review.    Reevaluation: After the interventions noted above, I reevaluated the patient and found that they have :resolved  Social Determinants of Health: Patient lives independently   Disposition:  DC w/ foley catheter in place and close urology f/u for tomorrow and again within 1 week w/ Dr. Annabell Howells. Patient given discharge instructions/return precautions. All questions answered to patient's satisfaction.    Co morbidities that complicate the patient evaluation  Past Medical History:  Diagnosis Date   Anemia    Arthritis    CAD (coronary artery disease)    a. s/p CABG in 03/2020 with LIMA-LAD, RIMA-PL, RA-D1-RI-OM1   Cancer (HCC)    rectal   Cellulitis    COPD (chronic obstructive pulmonary disease) (HCC)    Depression    Diabetes mellitus without complication (HCC)    GERD (gastroesophageal reflux disease)    History of kidney stones    Hyperlipidemia 12/01/2019   Hypertension    Hypothyroidism    Ischemic cardiomyopathy    a. EF 20-25% by echo in 02/2020 b. at 40% by echo in 03/2020 c. EF normalized to 60-65% by echo in 04/2020   Myocardial infarction [facility])    Peripheral vascular disease (HCC)    Sleep apnea    Type 2 diabetes mellitus (HCC)      Medicines Meds ordered this encounter  Medications   lidocaine (XYLOCAINE) 2 % jelly 1 Application    I have reviewed the patients home medicines and have made adjustments as needed  Problem List / ED Course: Problem  List Items Addressed This Visit   None Visit Diagnoses     Urinary retention    -  Primary                   This note was created using dictation software, which may contain spelling or grammatical errors.    Loetta Rough, MD 04/17/23 [phone]

## 2023-04-16 NOTE — Progress Notes (Signed)
Subjective: 1. Urinary retention      Consult requested by [name] MD  Shane Chambers is a 62yo male with a chronic foley who is sent for consideration of a suprapubic catheter.  The foley was initially placed on 02/15/23. F/U was discussed with urology at that time.  He is currently on tamsulosin for BPH with BOO.  His prostate is about 24ml on his CT on 02/14/23.  He has been a diabetic for 20 years and he has neuropathy.  He doesn't know if he has gastroparesis.   He was in the hospital for ARF and then CHF.  He has had a couple of UTI's but no GU surgery.  He has a history of stones but passed them.  He didn't have stones on the CT in April.  ROS:  Review of Systems  HENT:  Positive for congestion.   Gastrointestinal:  Positive for constipation.  Skin:  Positive for itching and rash.  Psychiatric/Behavioral:  Positive for depression. The patient is nervous/anxious.     Allergies  Allergen Reactions   Ace Inhibitors Swelling and Cough    (Not on MAR at [name])   Haloperidol And Related     Do NOT give anti-psychotics due to risk of  Torsades and QT prolongation (per Dr. Teena Irani request)   Other Itching    [name]   Shellfish Allergy     Listed on MAR   Penicillins Itching and Rash    Has patient had a PCN reaction causing immediate rash, facial/tongue/throat swelling, SOB or lightheadedness with hypotension: Unknown Has patient had a PCN reaction causing severe rash involving mucus membranes or skin necrosis: Unknown Has patient had a PCN reaction that required hospitalization: Unknown Has patient had a PCN reaction occurring within the last 10 years: Unknown If all of the above answers are "NO", then may proceed with Cephalosporin use.  Tolerated ancef (12-17-22)     Past Medical History:  Diagnosis Date   Anemia    Arthritis    CAD (coronary artery disease)    a. s/p CABG in 03/2020 with LIMA-LAD, RIMA-PL, RA-D1-RI-OM1   Cancer (HCC)     rectal   Cellulitis    COPD (chronic obstructive pulmonary disease) (HCC)    Depression    Diabetes mellitus without complication (HCC)    GERD (gastroesophageal reflux disease)    History of kidney stones    Hyperlipidemia 12/01/2019   Hypertension    Hypothyroidism    Ischemic cardiomyopathy    a. EF 20-25% by echo in 02/2020 b. at 40% by echo in 03/2020 c. EF normalized to 60-65% by echo in 04/2020   Myocardial infarction [facility])    Peripheral vascular disease (HCC)    Sleep apnea    Type 2 diabetes mellitus (HCC)     Past Surgical History:  Procedure Laterality Date   AMPUTATION Right 10/05/2022   Procedure: AMPUTATION BELOW KNEE;  Surgeon: Nadara Mustard, MD;  Location: [facility] OR;  Service: Orthopedics;  Laterality: Right;   AMPUTATION Right 11/14/2022   Procedure: AMPUTATION BELOW KNEE REVISION; WASHOUT;  Surgeon: Renford Dills, MD;  Location: [facility] ORS;  Service: Vascular;  Laterality: Right;   AMPUTATION Right 11/18/2022   Procedure: AMPUTATION BELOW KNEE REVISION AND CLOSURE;  Surgeon: Renford Dills, MD;  Location: [facility] ORS;  Service: Vascular;  Laterality: Right;   APPLICATION OF WOUND VAC Right 10/05/2022   Procedure: APPLICATION OF WOUND VAC;  Surgeon: Nadara Mustard, MD;  Location: [facility] OR;  Service: Orthopedics;  Laterality: Right;   BIOPSY  07/18/2021   Procedure: BIOPSY;  Surgeon: Lanelle Bal, DO;  Location: [facility];  Service: Endoscopy;;   BIOPSY  01/09/2022   Procedure: BIOPSY;  Surgeon: Lanelle Bal, DO;  [facility]: [facility];  Service: Endoscopy;;   COLONOSCOPY WITH PROPOFOL N/A 07/18/2021   Carver: 15 millimeter polyp removed from the sigmoid colon, 5 mm polyp removed from sigmoid colon.  Nonbleeding internal hemorrhoids.  Significant looping of the colon. sigmoid path showed invasive colonic adenocarcinoma involving tubular adenoma (invades to depth of 2mm, carcinoma 1mm from margin, no lymphovascular invasion, no poorly differentiated  component.   CORONARY ARTERY BYPASS GRAFT N/A 03/13/2020   Procedure: CORONARY ARTERY BYPASS GRAFTING (CABG) times five using bilateral Internal mammary arteries and left radial artery;  Surgeon: Linden Dolin, MD;  Location: [facility] OR;  Service: Open Heart Surgery;  Laterality: N/A;   DENTAL SURGERY     ESOPHAGOGASTRODUODENOSCOPY (EGD) WITH PROPOFOL N/A 07/18/2021   Carver: 1 gastric polyp status post biopsy, gastritis. gastric bx with slight chronic inflammation and no H.pyori. GEJ polypectomy with mild inflammation only   FLEXIBLE SIGMOIDOSCOPY N/A 08/26/2021   Carver: Nonbleeding internal hemorrhoids.  15 mm ulcers from previous polypectomy found in the rectum.  No evidence of previous polyp.  Located 5 to 8 cm from anal verge.   FLEXIBLE SIGMOIDOSCOPY N/A 01/09/2022   Procedure: FLEXIBLE SIGMOIDOSCOPY;  Surgeon: Lanelle Bal, DO;  [facility];  Service: Endoscopy;  Laterality: N/A;   POLYPECTOMY  07/18/2021   Procedure: POLYPECTOMY INTESTINAL;  Surgeon: Lanelle Bal, DO;  [facility];  Service: Endoscopy;;   RADIAL ARTERY HARVEST Left 03/13/2020   Procedure: RADIAL ARTERY HARVEST,;  Surgeon: Linden Dolin, MD;  Location: [facility] OR;  Service: Open Heart Surgery;  Laterality: Left;   RIGHT/LEFT HEART CATH AND CORONARY ANGIOGRAPHY N/A 03/07/2020   Procedure: RIGHT/LEFT HEART CATH AND CORONARY ANGIOGRAPHY;  Surgeon: Tonny Bollman, MD;  Location[facility] INVASIVE CV LAB;  Service: Cardiovascular;  Laterality: N/A;   STUMP REVISION Right 12/17/2022   Procedure: RIGHT BELOW KNEE AMPUTATION REVISION;  Surgeon: Nadara Mustard, MD;  Location: [facility] OR;  Service: Orthopedics;  Laterality: Right;   SUBMUCOSAL LIFTING INJECTION  01/09/2022   Procedure: SUBMUCOSAL LIFTING INJECTION;  Surgeon: Lanelle Bal, DO;  [facility];  Service: Endoscopy;;   SUBMUCOSAL TATTOO INJECTION  01/09/2022   Procedure: SUBMUCOSAL TATTOO INJECTION;  Surgeon: Lanelle Bal, DO;   [facility];  Service: Endoscopy;;   TEE WITHOUT CARDIOVERSION N/A 03/13/2020   Procedure: TRANSESOPHAGEAL ECHOCARDIOGRAM (TEE);  Surgeon: Linden Dolin, MD;  Location: [facility] OR;  Service: Open Heart Surgery;  Laterality: N/A;    Social History   Socioeconomic History   Marital status: Single    Spouse name: Not on file   Number of children: Not on file   Years of education: Not on file   Highest education level: Not on file  Occupational History   Not on file  Tobacco Use   Smoking status: Former    Packs/day: 1.5    Types: Cigarettes    Quit date: 05/25/1995    Years since quitting: 27.9   Smokeless tobacco: Never  Vaping Use   Vaping Use: Never used  Substance and Sexual Activity   Alcohol use: Not Currently    Comment: rarely   Drug use: No   Sexual activity: Not Currently  Other Topics Concern   Not on file  Social History Narrative   Not on file   Social Determinants of Health   Financial Resource Strain: Not on file  Food Insecurity: No Food Insecurity (03/27/2023)   Hunger Vital Sign    Worried About Running Out of Food in the Last Year: Never true    Ran Out of Food in the Last Year: Never true  Transportation Needs: No Transportation Needs (03/27/2023)   PRAPARE - Administrator, Civil Service (Medical): No    Lack of Transportation (Non-Medical): No  Physical Activity: Not on file  Stress: Not on file  Social Connections: Not on file  Intimate Partner Violence: Not At Risk (03/27/2023)   Humiliation, Afraid, Rape, and Kick questionnaire    Fear of Current or Ex-Partner: No    Emotionally Abused: No    Physically Abused: No    Sexually Abused: No    Family History  Problem Relation Age of Onset   Hypertension Mother     Anti-infectives: Anti-infectives (From admission, onward)    Start     Dose/Rate Route Frequency Ordered Stop   04/16/23 1030  CEPHALEXIN 250 MG PO CAPS        500 mg Oral Once 04/16/23 1017 04/16/23 1112        Current Outpatient Medications  Medication Sig Dispense Refill   acetaminophen (TYLENOL) 500 MG tablet Take 1,000 mg by mouth every 8 (eight) hours as needed for fever or moderate pain.     aspirin 81 MG EC tablet Take 1 tablet (81 mg total) by mouth daily with breakfast. 30 tablet 12   atorvastatin (LIPITOR) 40 MG tablet Take 1 tablet (40 mg total) by mouth at bedtime.     budesonide-formoterol (SYMBICORT) 160-4.5 MCG/ACT inhaler Inhale 2 puffs into the lungs in the morning and at bedtime. 1 Inhaler 6   carvedilol (COREG) 25 MG tablet Take 1 tablet (25 mg total) by mouth 2 (two) times daily with a meal.     ferrous gluconate (FERGON) 324 MG tablet Take 324 mg by mouth daily.     gabapentin (NEURONTIN) 300 MG capsule Take 1 capsule (300 mg total) by mouth at bedtime.     Glucerna (GLUCERNA) LIQD Take 1 Can by mouth with breakfast, with lunch, and with evening meal.     hydrALAZINE (APRESOLINE) 100 MG tablet Take 1 tablet (100 mg total) by mouth every 8 (eight) hours.     insulin aspart (NOVOLOG FLEXPEN) 100 UNIT/ML FlexPen 0-15 Units, Subcutaneous, 3 times daily with meals CBG < 70: implement hypoglycemia protocol-call MD CBG 70 - 120: 0 units CBG 121 - 150: 2 units CBG 151 - 200: 3 units CBG 201 - 250: 5 units CBG 251 - 300: 8 units CBG 301 - 350: 11 units CBG 351 - 400: 15 units CBG > 400: 15 mL 0   ipratropium-albuterol (DUONEB) 0.5-2.5 (3) MG/3ML SOLN Take 3 mLs by nebulization every 6 (six) hours as needed. (Patient taking differently: Take 3 mLs by nebulization every 6 (six) hours as needed (SOB/wheezing).) 360 mL    levothyroxine (SYNTHROID) 75 MCG tablet Take 75 mcg by mouth daily before breakfast.     mirtazapine (REMERON) 30 MG tablet Take 30 mg by mouth at bedtime.     Multiple Vitamin (MULTIVITAMIN) tablet Take 1 tablet by mouth daily.     omeprazole (PRILOSEC) 40 MG capsule Take 40 mg by mouth in the morning.     polyethylene glycol (  MIRALAX / GLYCOLAX) 17 g packet  Take 17 g by mouth daily as needed for moderate constipation.     polyvinyl alcohol (LIQUIFILM TEARS) 1.4 % ophthalmic solution Place 1 drop into both eyes daily.     tamsulosin (FLOMAX) 0.4 MG CAPS capsule Take 0.8 mg by mouth every evening.     thiamine (VITAMIN B-1) 100 MG tablet Take 1 tablet (100 mg total) by mouth daily.     torsemide (DEMADEX) 20 MG tablet Take 1 tablet (20 mg total) by mouth daily.     venlafaxine XR (EFFEXOR-XR) 75 MG 24 hr capsule Take 225 mg by mouth daily.     Vitamin D, Ergocalciferol, (DRISDOL) 1.25 MG (50000 UNIT) CAPS capsule Take 50,000 Units by mouth every 7 (seven) days. (Mondays)     albuterol (VENTOLIN HFA) 108 (90 Base) MCG/ACT inhaler Inhale 2 puffs into the lungs every 6 (six) hours as needed for wheezing or shortness of breath. (Patient not taking: Reported on 04/16/2023)     No current facility-administered medications for this visit.     Objective: Vital signs in last 24 hours: BP 107/64   Pulse 72   Ht 5\' 9"  (1.753 m)   Wt 206 lb 12.8 oz (93.8 kg)   BMI 30.54 kg/m   Intake/Output from previous day: No intake/output data recorded. Intake/Output this shift: @IOTHISSHIFT @   Physical Exam Vitals reviewed.  Constitutional:      Appearance: Normal appearance.  Cardiovascular:     Rate and Rhythm: Normal rate and regular rhythm.  Pulmonary:     Effort: Pulmonary effort is normal. No respiratory distress.  Genitourinary:    Comments: Foley indwelling with cloudy urine.  He has a meatal erosion back to just proximal to the coronal sulcus.   Scrotum is normal. Testes are tender but otherwise normal. Epididymis normal.  Neurological:     Mental Status: He is alert.     Lab Results:  No results found for this or any previous visit (from the past 24 hour(s)).  BMET Recent Labs    04/16/23 2152  NA 138  K 3.8  CL 103  CO2 25  GLUCOSE 109*  BUN 42*  CREATININE 1.70*  CALCIUM 8.9   PT/INR No results for input(s): "LABPROT",  "INR" in the last 72 hours. ABG No results for input(s): "PHART", "HCO3" in the last 72 hours.  Invalid input(s): "PCO2", "PO2"  Latest Reference Range & Units 04/02/23 06:25  BASIC METABOLIC PANEL  Rpt !  Sodium 135 - 145 mmol/L 140  Potassium 3.5 - 5.1 mmol/L 4.2  Chloride 98 - 111 mmol/L 105  CO2 22 - 32 mmol/L 26  Glucose 70 - 99 mg/dL 96  BUN 6 - 20 mg/dL 61 (H)  Creatinine 1.61 - 1.24 mg/dL 0.96 (H)  Calcium 8.9 - 10.3 mg/dL 8.9  Anion gap 5 - 15  9  GFR, Estimated >60 mL/min 46 (L)  !: Data is abnormal (H): Data is abnormally high (L): Data is abnormally low Rpt: View report in Results Review for more information Studies/Results: No results found. I have reviewed his hospital notes and labs as well as his CT films and report.   Assessment/Plan: Urinary retention.   I discussed options and he would like to have a voiding trial.  I will get a culture from the catheter and give him keflex 500mg  to cover the removal.  He will need to return tomorrow for a PVR.     Meds ordered this encounter  Medications  cephALEXin (KEFLEX) capsule 500 mg     Orders Placed This Encounter  Procedures   Urine Culture   Urinalysis, Routine w reflex microscopic   Foley catheter - discontinue     Return in about 1 year (around 04/15/2024) for He needs to return tomorrow for a PVR and then in a week.  Can see [facility] Dec. .    CC: Dr. [name].      Bjorn Pippin 04/17/2023

## 2023-04-17 ENCOUNTER — Ambulatory Visit: Payer: Medicare Other

## 2023-04-17 LAB — CBG MONITORING, ED: Glucose-Capillary: 95 mg/dL (ref 70–99)

## 2023-04-17 NOTE — ED Notes (Signed)
Spoke to [employer] at [facility] and gave report on pt coming back to facility.

## 2023-04-17 NOTE — ED Notes (Signed)
EMS is here for pt transport.  D/c instructions read to EMS

## 2023-04-17 NOTE — ED Notes (Signed)
Contacted [facility] about transportation. Per C-com pt is next to be transported once rescue comes on duty.

## 2023-04-19 LAB — URINE CULTURE: Organism ID, Bacteria: NO GROWTH

## 2023-05-19 ENCOUNTER — Other Ambulatory Visit: Payer: Self-pay | Admitting: *Deleted

## 2023-05-19 ENCOUNTER — Encounter: Payer: Self-pay | Admitting: Gastroenterology

## 2023-05-19 ENCOUNTER — Ambulatory Visit (INDEPENDENT_AMBULATORY_CARE_PROVIDER_SITE_OTHER): Payer: Medicare Other | Admitting: Gastroenterology

## 2023-05-19 VITALS — BP 127/66 | HR 75 | Temp 98.6°F | Ht 69.0 in | Wt 213.8 lb

## 2023-05-19 DIAGNOSIS — K5900 Constipation, unspecified: Secondary | ICD-10-CM | POA: Diagnosis not present

## 2023-05-19 DIAGNOSIS — D649 Anemia, unspecified: Secondary | ICD-10-CM

## 2023-05-19 DIAGNOSIS — C187 Malignant neoplasm of sigmoid colon: Secondary | ICD-10-CM | POA: Diagnosis not present

## 2023-05-19 DIAGNOSIS — C20 Malignant neoplasm of rectum: Secondary | ICD-10-CM

## 2023-05-19 DIAGNOSIS — D509 Iron deficiency anemia, unspecified: Secondary | ICD-10-CM

## 2023-05-19 NOTE — Patient Instructions (Signed)
We are arranging a colonoscopy with Dr. Marletta Lor in the future. You will need to stop iron 7 days before the procedure.   For constipation: let's start Linzess 290 mcg once daily, 30 minutes before breakfast. Linzess works best when taken once a day every day, on an empty stomach, at least 30 minutes before your first meal of the day.  When Linzess is taken daily as directed:  *Constipation relief is typically felt in about a week *IBS-C patients may begin to experience relief from belly pain and overall abdominal symptoms (pain, discomfort, and bloating) in about 1 week,   with symptoms typically improving over 12 weeks.  Diarrhea may occur in the first 2 weeks -keep taking it.  The diarrhea should go away and you should start having normal, complete, full bowel movements. It may be helpful to start treatment when you can be near the comfort of your own bathroom, such as a weekend.    Please let the facility know how this works for you, so we can titrate the dose if needed. We will send a prescription in once you find the dose that works well for you!  It was a pleasure to see you today. I want to create trusting relationships with patients and provide genuine, compassionate, and quality care. I truly value your feedback, so please be on the lookout for a survey regarding your visit with me today. I appreciate your time in completing this!         Gelene Mink, PhD, ANP-BC [facility]

## 2023-05-19 NOTE — Progress Notes (Signed)
Gastroenterology Office Note     Primary Care Physician:  [name], MD  Primary Gastroenterologist: Dr. Marletta Lor   Chief Complaint   Chief Complaint  Patient presents with   Follow-up    Pt has /had diarrhea alternating to constipation. Nothing he eats or drinks makes a difference     History of Present Illness   Shane Chambers is a 62 y.o. male presenting today with history of rectal cancer diagnosed at initial screening colonoscopy in Sept 2022 (2 polyps removed from the sigmoid colon one measuring 15 mm and one measuring 5 mm placed in the same container.  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  No lymphovascular invasion.  No poorly differentiated component). Flex sig in Oct 2022 with ulcerated site at prior polypectomy noted in the rectum, 5 to 8 cm from the anal verge. He was evaluated by Dr. Romie Levee, who felt cancer was superficial and could follow endoscopically. Flex sig March 2023 with post-polypectomy scar in rectum s/p biopsy and benign path. He needs a high risk surveillance colonoscopy at this time.  Patient has subsequently been followed by Dr. Ellin Saba. Additional history including normocyctic anemia r/t CKD and relative IDA.    April 2024: CT abd/pelvis with contrast without evidence of recurrence or metastatic disease.   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   Alternating diarrhea and constipation since the fall. Notes history of chronic intermittent constipation in the past. Would take colace, dulcolax, enema, sometimes Milk of Mag.  States stool oozing. Has small BMs most days. Has not had a formed stool since February. Will have to strain and nothing comes out or has oozing stool. No rectal bleeding but does not normally see the  stool that comes out. Will take Miralax prn. Not having good results with this.    Wheelchair bound. Does not have prosthesis.     Past Medical History:  Diagnosis Date   Anemia    Arthritis    CAD (coronary artery disease)    a. s/p CABG in 03/2020 with LIMA-LAD, RIMA-PL, RA-[id number]-OM1   Cancer (HCC)    rectal   Cellulitis    COPD (chronic obstructive pulmonary disease) (HCC)    Depression    Diabetes mellitus without complication (HCC)    GERD (gastroesophageal reflux disease)    History of kidney stones    Hyperlipidemia 12/01/2019   Hypertension    Hypothyroidism    Ischemic cardiomyopathy    a. EF 20-25% by echo in 02/2020 b. at 40% by echo in 03/2020 c. EF normalized to 60-65% by echo in 04/2020   Myocardial infarction [facility])    Peripheral vascular disease (HCC)    Sleep apnea    Type 2 diabetes mellitus (HCC)     Past Surgical History:  Procedure Laterality Date   AMPUTATION Right 10/05/2022   Procedure: AMPUTATION BELOW KNEE;  Surgeon: Nadara Mustard, MD;  Location: [facility] OR;  Service: Orthopedics;  Laterality: Right;   AMPUTATION Right 11/14/2022   Procedure: AMPUTATION BELOW KNEE REVISION; WASHOUT;  Surgeon: Renford Dills, MD;  Location: [facility] ORS;  Service: Vascular;  Laterality: Right;   AMPUTATION Right 11/18/2022   Procedure: AMPUTATION BELOW KNEE REVISION AND CLOSURE;  Surgeon: Renford Dills, MD;  Location: [facility] ORS;  Service:  Vascular;  Laterality: Right;   APPLICATION OF WOUND VAC Right 10/05/2022   Procedure: APPLICATION OF WOUND VAC;  Surgeon: Nadara Mustard, MD;  Location: [facility] OR;  Service: Orthopedics;  Laterality: Right;   BIOPSY  07/18/2021   Procedure: BIOPSY;  Surgeon: Lanelle Bal, DO;  Location: [facility];  Service: Endoscopy;;   BIOPSY  01/09/2022   Procedure: BIOPSY;  Surgeon: Lanelle Bal, DO;  [facility]: [facility];  Service: Endoscopy;;   COLONOSCOPY WITH PROPOFOL N/A 07/18/2021   Carver: 15 millimeter polyp removed from the  sigmoid colon, 5 mm polyp removed from sigmoid colon.  Nonbleeding internal hemorrhoids.  Significant looping of the colon. sigmoid path showed invasive colonic adenocarcinoma involving tubular adenoma (invades to depth of 2mm, carcinoma 1mm from margin, no lymphovascular invasion, no poorly differentiated component.   CORONARY ARTERY BYPASS GRAFT N/A 03/13/2020   Procedure: CORONARY ARTERY BYPASS GRAFTING (CABG) times five using bilateral Internal mammary arteries and left radial artery;  Surgeon: Linden Dolin, MD;  Location: [facility] OR;  Service: Open Heart Surgery;  Laterality: N/A;   DENTAL SURGERY     ESOPHAGOGASTRODUODENOSCOPY (EGD) WITH PROPOFOL N/A 07/18/2021   Carver: 1 gastric polyp status post biopsy, gastritis. gastric bx with slight chronic inflammation and no H.pyori. GEJ polypectomy with mild inflammation only   FLEXIBLE SIGMOIDOSCOPY N/A 08/26/2021   Carver: Nonbleeding internal hemorrhoids.  15 mm ulcers from previous polypectomy found in the rectum.  No evidence of previous polyp.  Located 5 to 8 cm from anal verge.   FLEXIBLE SIGMOIDOSCOPY N/A 01/09/2022   Procedure: FLEXIBLE SIGMOIDOSCOPY;  Surgeon: Lanelle Bal, DO;  [facility];  Service: Endoscopy;  Laterality: N/A;   POLYPECTOMY  07/18/2021   Procedure: POLYPECTOMY INTESTINAL;  Surgeon: Lanelle Bal, DO;  [facility];  Service: Endoscopy;;   RADIAL ARTERY HARVEST Left 03/13/2020   Procedure: RADIAL ARTERY HARVEST,;  Surgeon: Linden Dolin, MD;  Location: [facility] OR;  Service: Open Heart Surgery;  Laterality: Left;   RIGHT/LEFT HEART CATH AND CORONARY ANGIOGRAPHY N/A 03/07/2020   Procedure: RIGHT/LEFT HEART CATH AND CORONARY ANGIOGRAPHY;  Surgeon: Tonny Bollman, MD;  Location: [facility] INVASIVE CV LAB;  Service: Cardiovascular;  Laterality: N/A;   STUMP REVISION Right 12/17/2022   Procedure: RIGHT BELOW KNEE AMPUTATION REVISION;  Surgeon: Nadara Mustard, MD;  Location: [facility] OR;  Service: Orthopedics;   Laterality: Right;   SUBMUCOSAL LIFTING INJECTION  01/09/2022   Procedure: SUBMUCOSAL LIFTING INJECTION;  Surgeon: Lanelle Bal, DO;  [facility];  Service: Endoscopy;;   SUBMUCOSAL TATTOO INJECTION  01/09/2022   Procedure: SUBMUCOSAL TATTOO INJECTION;  Surgeon: Lanelle Bal, DO;  [facility];  Service: Endoscopy;;   TEE WITHOUT CARDIOVERSION N/A 03/13/2020   Procedure: TRANSESOPHAGEAL ECHOCARDIOGRAM (TEE);  Surgeon: Linden Dolin, MD;  Location: [facility] OR;  Service: Open Heart Surgery;  Laterality: N/A;    Current Outpatient Medications  Medication Sig Dispense Refill   acetaminophen (TYLENOL) 500 MG tablet Take 1,000 mg by mouth every 8 (eight) hours as needed for fever or moderate pain.     aspirin 81 MG EC tablet Take 1 tablet (81 mg total) by mouth daily with breakfast. 30 tablet 12   atorvastatin (LIPITOR) 40 MG tablet Take 1 tablet (40 mg total) by mouth at bedtime.     budesonide-formoterol (SYMBICORT) 160-4.5 MCG/ACT inhaler Inhale 2 puffs into the lungs in the morning and at bedtime. 1 Inhaler 6   carvedilol (  COREG) 25 MG tablet Take 1 tablet (25 mg total) by mouth 2 (two) times daily with a meal.     ferrous gluconate (FERGON) 324 MG tablet Take 324 mg by mouth daily.     gabapentin (NEURONTIN) 300 MG capsule Take 1 capsule (300 mg total) by mouth at bedtime.     Glucerna (GLUCERNA) LIQD Take 1 Can by mouth with breakfast, with lunch, and with evening meal.     hydrALAZINE (APRESOLINE) 100 MG tablet Take 1 tablet (100 mg total) by mouth every 8 (eight) hours.     insulin aspart (NOVOLOG FLEXPEN) 100 UNIT/ML FlexPen 0-15 Units, Subcutaneous, 3 times daily with meals CBG < 70: implement hypoglycemia protocol-call MD CBG 70 - 120: 0 units CBG 121 - 150: 2 units CBG 151 - 200: 3 units CBG 201 - 250: 5 units CBG 251 - 300: 8 units CBG 301 - 350: 11 units CBG 351 - 400: 15 units CBG > 400: 15 mL 0   ipratropium-albuterol (DUONEB) 0.5-2.5 (3) MG/3ML  SOLN Take 3 mLs by nebulization every 6 (six) hours as needed. (Patient taking differently: Take 3 mLs by nebulization every 6 (six) hours as needed (SOB/wheezing).) 360 mL    levothyroxine (SYNTHROID) 75 MCG tablet Take 75 mcg by mouth daily before breakfast.     mirtazapine (REMERON) 30 MG tablet Take 30 mg by mouth at bedtime.     Multiple Vitamin (MULTIVITAMIN) tablet Take 1 tablet by mouth daily.     omeprazole (PRILOSEC) 40 MG capsule Take 40 mg by mouth in the morning.     polyethylene glycol (MIRALAX / GLYCOLAX) 17 g packet Take 17 g by mouth daily as needed for moderate constipation.     polyvinyl alcohol (LIQUIFILM TEARS) 1.4 % ophthalmic solution Place 1 drop into both eyes daily.     tamsulosin (FLOMAX) 0.4 MG CAPS capsule Take 0.8 mg by mouth every evening.     thiamine (VITAMIN B-1) 100 MG tablet Take 1 tablet (100 mg total) by mouth daily.     torsemide (DEMADEX) 20 MG tablet Take 1 tablet (20 mg total) by mouth daily.     venlafaxine XR (EFFEXOR-XR) 75 MG 24 hr capsule Take 225 mg by mouth daily.     Vitamin D, Ergocalciferol, (DRISDOL) 1.25 MG (50000 UNIT) CAPS capsule Take 50,000 Units by mouth every 7 (seven) days. (Mondays)     albuterol (VENTOLIN HFA) 108 (90 Base) MCG/ACT inhaler Inhale 2 puffs into the lungs every 6 (six) hours as needed for wheezing or shortness of breath. (Patient not taking: Reported on 04/16/2023)     No current facility-administered medications for this visit.    Allergies as of 05/19/2023 - Review Complete 05/19/2023  Allergen Reaction Noted   Ace inhibitors Swelling and Cough 12/11/2017   Haloperidol and related  10/14/2022   Other Itching 11/15/2019   Shellfish allergy  10/05/2022   Penicillins Itching and Rash 12/11/2017    Family History  Problem Relation Age of Onset   Hypertension Mother     Social History   Socioeconomic History   Marital status: Single    Spouse name: Not on file   Number of children: Not on file   Years of  education: Not on file   Highest education level: Not on file  Occupational History   Not on file  Tobacco Use   Smoking status: Former    Packs/day: 1.5    Types: Cigarettes    Quit date: 05/25/1995  Years since quitting: 28.0   Smokeless tobacco: Never  Vaping Use   Vaping Use: Never used  Substance and Sexual Activity   Alcohol use: Not Currently    Comment: rarely   Drug use: No   Sexual activity: Not Currently  Other Topics Concern   Not on file  Social History Narrative   Not on file   Social Determinants of Health   Financial Resource Strain: Not on file  Food Insecurity: No Food Insecurity (03/27/2023)   Hunger Vital Sign    Worried About Running Out of Food in the Last Year: Never true    Ran Out of Food in the Last Year: Never true  Transportation Needs: No Transportation Needs (03/27/2023)   PRAPARE - Administrator, Civil Service (Medical): No    Lack of Transportation (Non-Medical): No  Physical Activity: Not on file  Stress: Not on file  Social Connections: Not on file  Intimate Partner Violence: Not At Risk (03/27/2023)   Humiliation, Afraid, Rape, and Kick questionnaire    Fear of Current or Ex-Partner: No    Emotionally Abused: No    Physically Abused: No    Sexually Abused: No     Review of Systems   Gen: Denies any fever, chills, fatigue, weight loss, lack of appetite.  CV: Denies chest pain, heart palpitations, peripheral edema, syncope.  Resp: Denies shortness of breath at rest or with exertion. Denies wheezing or cough.  GI: Denies dysphagia or odynophagia. Denies jaundice, hematemesis, fecal incontinence. GU : Denies urinary burning, urinary frequency, urinary hesitancy MS: Denies joint pain, muscle weakness, cramps, or limitation of movement.  Derm: Denies rash, itching, dry skin Psych: Denies depression, anxiety, memory loss, and confusion Heme: Denies bruising, bleeding, and enlarged lymph nodes.   Physical Exam   BP  127/66   Pulse 75   Temp 98.6 F (37 C)   Ht 5\' 9"  (1.753 m)   Wt 213 lb 12.8 oz (97 kg)   BMI 31.57 kg/m  General:   Alert and oriented. Pleasant and cooperative. Well-nourished and well-developed.  Head:  Normocephalic and atraumatic. Eyes:  Without icterus Cardiac: S1 S2 present with murmur Lungs: clear bilaterally Abdomen:  +BS, soft, non-tender and non-distended. No HSM noted. No guarding or rebound. No masses appreciated.  Rectal:  Deferred  Msk:  Symmetrical without gross deformities. Normal posture. Extremities:  right BKA Neurologic:  Alert and  oriented x4;  grossly normal neurologically. Skin:  Intact without significant lesions or rashes. Psych:  Alert and cooperative. Normal mood and affect.   Assessment   Shane Chambers is a 62 y.o. male presenting today with a history of rectal cancer diagnosed at initial screening colonoscopy in Sept 2022, evaluated by Dr. Romie Levee and felt cancer was superficial and could follow endoscopically.   Last flex sig in March 2023 to re-evaluate site with benign path. He is due for complete colonoscopy at this time. April 2024 CT without evidence of recurrence or metastatic disease.   He is wheelchair bound and reports alternating constipation and diarrhea, with predominant constipation. I am unable to do rectal exam. Failing OTC agents. We will start Linzess 290 mcg daily. He will let the facility know how this works for him.   He is also overdue for seeing Oncology; we will refer back to them for follow-up.      PLAN    Trial of Linzess 290 mcg daily. Call with update and will send rx Proceed with colonoscopy  by Dr. Marletta Lor  in near future: the risks, benefits, and alternatives have been discussed with the patient in detail. The patient states understanding and desires to proceed.  Hold iron 7 days prior Extra 1/2 day clear liquids   Gelene Mink, PhD, [facility]

## 2023-05-26 ENCOUNTER — Other Ambulatory Visit: Payer: Medicare Other

## 2023-05-28 ENCOUNTER — Other Ambulatory Visit: Payer: Self-pay | Admitting: *Deleted

## 2023-05-28 ENCOUNTER — Encounter: Payer: Self-pay | Admitting: Internal Medicine

## 2023-05-28 ENCOUNTER — Ambulatory Visit: Payer: Medicare Other | Attending: Internal Medicine | Admitting: Internal Medicine

## 2023-05-28 ENCOUNTER — Encounter: Payer: Self-pay | Admitting: *Deleted

## 2023-05-28 ENCOUNTER — Telehealth: Payer: Self-pay | Admitting: *Deleted

## 2023-05-28 VITALS — BP 122/58 | HR 58 | Ht 69.0 in | Wt 213.4 lb

## 2023-05-28 DIAGNOSIS — I4721 Torsades de pointes: Secondary | ICD-10-CM | POA: Insufficient documentation

## 2023-05-28 MED ORDER — BISACODYL 5 MG PO TBEC: 5.0000 mg | DELAYED_RELEASE_TABLET | Freq: Every day | ORAL | 0 refills | Status: DC | PRN

## 2023-05-28 MED ORDER — FLEET ENEMA 7-19 GM/118ML RE ENEM: 1.0000 | ENEMA | Freq: Once | RECTAL | 0 refills | Status: AC

## 2023-05-28 MED ORDER — PEG 3350-KCL-NA BICARB-NACL 420 G PO SOLR: 4000.0000 mL | Freq: Once | ORAL | 0 refills | Status: AC

## 2023-05-28 NOTE — Progress Notes (Signed)
HPI Shane Chambers returns for followup. He has a h/o CAD, s/p MI, s/p CABG, chronic systolic heart failure, PVD s/p right BKA, and drug induced long QT and torsades. He has not had any more arrhythmias or syncope since his meds were adjusted and he was placed on a beta blocker.  Allergies  Allergen Reactions   Ace Inhibitors Swelling and Cough    (Not on MAR at [name])   Haloperidol And Related     Do NOT give anti-psychotics due to risk of  Torsades and QT prolongation (per Dr. Teena Irani request)   Other Itching    [name]   Shellfish Allergy     Listed on MAR   Penicillins Itching and Rash    Has patient had a PCN reaction causing immediate rash, facial/tongue/throat swelling, SOB or lightheadedness with hypotension: Unknown Has patient had a PCN reaction causing severe rash involving mucus membranes or skin necrosis: Unknown Has patient had a PCN reaction that required hospitalization: Unknown Has patient had a PCN reaction occurring within the last 10 years: Unknown If all of the above answers are "NO", then may proceed with Cephalosporin use.  Tolerated ancef (12-17-22)      Current Outpatient Medications  Medication Sig Dispense Refill   acetaminophen (TYLENOL) 500 MG tablet Take 1,000 mg by mouth every 8 (eight) hours as needed for fever or moderate pain.     albuterol (VENTOLIN HFA) 108 (90 Base) MCG/ACT inhaler Inhale 2 puffs into the lungs every 6 (six) hours as needed for wheezing or shortness of breath.     aspirin 81 MG EC tablet Take 1 tablet (81 mg total) by mouth daily with breakfast. 30 tablet 12   atorvastatin (LIPITOR) 40 MG tablet Take 1 tablet (40 mg total) by mouth at bedtime.     bisacodyl 5 MG EC tablet Take 1 tablet (5 mg total) by mouth daily as needed for moderate constipation. 30 tablet 0   budesonide-formoterol (SYMBICORT) 160-4.5 MCG/ACT inhaler Inhale 2 puffs into the lungs in the morning and at bedtime. 1  Inhaler 6   carvedilol (COREG) 25 MG tablet Take 1 tablet (25 mg total) by mouth 2 (two) times daily with a meal.     ferrous gluconate (FERGON) 324 MG tablet Take 324 mg by mouth daily.     gabapentin (NEURONTIN) 300 MG capsule Take 1 capsule (300 mg total) by mouth at bedtime.     Glucerna (GLUCERNA) LIQD Take 1 Can by mouth with breakfast, with lunch, and with evening meal.     hydrALAZINE (APRESOLINE) 100 MG tablet Take 1 tablet (100 mg total) by mouth every 8 (eight) hours.     insulin aspart (NOVOLOG FLEXPEN) 100 UNIT/ML FlexPen 0-15 Units, Subcutaneous, 3 times daily with meals CBG < 70: implement hypoglycemia protocol-call MD CBG 70 - 120: 0 units CBG 121 - 150: 2 units CBG 151 - 200: 3 units CBG 201 - 250: 5 units CBG 251 - 300: 8 units CBG 301 - 350: 11 units CBG 351 - 400: 15 units CBG > 400: 15 mL 0   ipratropium-albuterol (DUONEB) 0.5-2.5 (3) MG/3ML SOLN Take 3 mLs by nebulization every 6 (six) hours as needed. (Patient taking differently: Take 3 mLs by nebulization every 6 (six) hours as needed (SOB/wheezing).) 360 mL    levothyroxine (SYNTHROID) 75 MCG tablet Take 75 mcg by mouth daily before breakfast.     mirtazapine (REMERON) 30 MG tablet Take 30  mg by mouth at bedtime.     Multiple Vitamin (MULTIVITAMIN) tablet Take 1 tablet by mouth daily.     omeprazole (PRILOSEC) 40 MG capsule Take 40 mg by mouth in the morning.     polyethylene glycol (MIRALAX / GLYCOLAX) 17 g packet Take 17 g by mouth daily as needed for moderate constipation.     polyethylene glycol-electrolytes (NULYTELY) 420 g solution Take 4,000 mLs by mouth once for 1 dose. 4000 mL 0   polyvinyl alcohol (LIQUIFILM TEARS) 1.4 % ophthalmic solution Place 1 drop into both eyes daily.     sodium phosphate (FLEET) 7-19 GM/118ML ENEM Place 133 mLs (1 enema total) rectally once for 1 dose. 133 mL 0   tamsulosin (FLOMAX) 0.4 MG CAPS capsule Take 0.8 mg by mouth every evening.     thiamine (VITAMIN B-1) 100 MG tablet  Take 1 tablet (100 mg total) by mouth daily.     torsemide (DEMADEX) 20 MG tablet Take 1 tablet (20 mg total) by mouth daily.     venlafaxine XR (EFFEXOR-XR) 75 MG 24 hr capsule Take 225 mg by mouth daily.     Vitamin D, Ergocalciferol, (DRISDOL) 1.25 MG (50000 UNIT) CAPS capsule Take 50,000 Units by mouth every 7 (seven) days. (Mondays)     No current facility-administered medications for this visit.     Past Medical History:  Diagnosis Date   Anemia    Arthritis    CAD (coronary artery disease)    a. s/p CABG in 03/2020 with LIMA-LAD, RIMA-PL, RA-D1-RI-OM1   Cancer (HCC)    rectal   Cellulitis    COPD (chronic obstructive pulmonary disease) (HCC)    Depression    Diabetes mellitus without complication (HCC)    GERD (gastroesophageal reflux disease)    History of kidney stones    Hyperlipidemia 12/01/2019   Hypertension    Hypothyroidism    Ischemic cardiomyopathy    a. EF 20-25% by echo in 02/2020 b. at 40% by echo in 03/2020 c. EF normalized to 60-65% by echo in 04/2020   Myocardial infarction [facility])    Peripheral vascular disease (HCC)    Sleep apnea    Type 2 diabetes mellitus (HCC)     ROS:   All systems reviewed and negative except as noted in the HPI.   Past Surgical History:  Procedure Laterality Date   AMPUTATION Right 10/05/2022   Procedure: AMPUTATION BELOW KNEE;  Surgeon: Nadara Mustard, MD;  Location: [facility] OR;  Service: Orthopedics;  Laterality: Right;   AMPUTATION Right 11/14/2022   Procedure: AMPUTATION BELOW KNEE REVISION; WASHOUT;  Surgeon: Renford Dills, MD;  Location: [facility] ORS;  Service: Vascular;  Laterality: Right;   AMPUTATION Right 11/18/2022   Procedure: AMPUTATION BELOW KNEE REVISION AND CLOSURE;  Surgeon: Renford Dills, MD;  Location: [facility] ORS;  Service: Vascular;  Laterality: Right;   APPLICATION OF WOUND VAC Right 10/05/2022   Procedure: APPLICATION OF WOUND VAC;  Surgeon: Nadara Mustard, MD;  Location: [facility] OR;  Service: Orthopedics;   Laterality: Right;   BIOPSY  07/18/2021   Procedure: BIOPSY;  Surgeon: Lanelle Bal, DO;  Location: [facility];  Service: Endoscopy;;   BIOPSY  01/09/2022   Procedure: BIOPSY;  Surgeon: Lanelle Bal, DO;  [facility]: [facility];  Service: Endoscopy;;   COLONOSCOPY WITH PROPOFOL N/A 07/18/2021   Carver: 15 millimeter polyp removed from the sigmoid colon, 5 mm polyp removed from sigmoid colon.  Nonbleeding internal hemorrhoids.  Significant  looping of the colon. sigmoid path showed invasive colonic adenocarcinoma involving tubular adenoma (invades to depth of 2mm, carcinoma 1mm from margin, no lymphovascular invasion, no poorly differentiated component.   CORONARY ARTERY BYPASS GRAFT N/A 03/13/2020   Procedure: CORONARY ARTERY BYPASS GRAFTING (CABG) times five using bilateral Internal mammary arteries and left radial artery;  Surgeon: Linden Dolin, MD;  Location: [facility] OR;  Service: Open Heart Surgery;  Laterality: N/A;   DENTAL SURGERY     ESOPHAGOGASTRODUODENOSCOPY (EGD) WITH PROPOFOL N/A 07/18/2021   Carver: 1 gastric polyp status post biopsy, gastritis. gastric bx with slight chronic inflammation and no H.pyori. GEJ polypectomy with mild inflammation only   FLEXIBLE SIGMOIDOSCOPY N/A 08/26/2021   Carver: Nonbleeding internal hemorrhoids.  15 mm ulcers from previous polypectomy found in the rectum.  No evidence of previous polyp.  Located 5 to 8 cm from anal verge.   FLEXIBLE SIGMOIDOSCOPY N/A 01/09/2022   Procedure: FLEXIBLE SIGMOIDOSCOPY;  Surgeon: Lanelle Bal, DO;  [facility];  Service: Endoscopy;  Laterality: N/A;   POLYPECTOMY  07/18/2021   Procedure: POLYPECTOMY INTESTINAL;  Surgeon: Lanelle Bal, DO;  [facility];  Service: Endoscopy;;   RADIAL ARTERY HARVEST Left 03/13/2020   Procedure: RADIAL ARTERY HARVEST,;  Surgeon: Linden Dolin, MD;  Location: [facility] OR;  Service: Open Heart Surgery;  Laterality: Left;   RIGHT/LEFT HEART CATH AND  CORONARY ANGIOGRAPHY N/A 03/07/2020   Procedure: RIGHT/LEFT HEART CATH AND CORONARY ANGIOGRAPHY;  Surgeon: Tonny Bollman, MD;  [facility];  Service: Cardiovascular;  Laterality: N/A;   STUMP REVISION Right 12/17/2022   Procedure: RIGHT BELOW KNEE AMPUTATION REVISION;  Surgeon: Nadara Mustard, MD;  Location: [facility] OR;  Service: Orthopedics;  Laterality: Right;   SUBMUCOSAL LIFTING INJECTION  01/09/2022   Procedure: SUBMUCOSAL LIFTING INJECTION;  Surgeon: Lanelle Bal, DO;  [facility];  Service: Endoscopy;;   SUBMUCOSAL TATTOO INJECTION  01/09/2022   Procedure: SUBMUCOSAL TATTOO INJECTION;  Surgeon: Lanelle Bal, DO;  [facility];  Service: Endoscopy;;   TEE WITHOUT CARDIOVERSION N/A 03/13/2020   Procedure: TRANSESOPHAGEAL ECHOCARDIOGRAM (TEE);  Surgeon: Linden Dolin, MD;  Location: [facility] OR;  Service: Open Heart Surgery;  Laterality: N/A;     Family History  Problem Relation Age of Onset   Hypertension Mother      Social History   Socioeconomic History   Marital status: Single    Spouse name: Not on file   Number of children: Not on file   Years of education: Not on file   Highest education level: Not on file  Occupational History   Not on file  Tobacco Use   Smoking status: Former    Current packs/day: 0.00    Types: Cigarettes    Quit date: 05/25/1995    Years since quitting: 28.0   Smokeless tobacco: Never  Vaping Use   Vaping status: Never Used  Substance and Sexual Activity   Alcohol use: Not Currently    Comment: rarely   Drug use: No   Sexual activity: Not Currently  Other Topics Concern   Not on file  Social History Narrative   Not on file   Social Determinants of Health   Financial Resource Strain: Not on file  Food Insecurity: No Food Insecurity (03/27/2023)   Hunger Vital Sign    Worried About Running Out of Food in the Last Year: Never true    Ran Out of Food in the Last  Year: Never true  Transportation  Needs: No Transportation Needs (03/27/2023)   PRAPARE - Administrator, Civil Service (Medical): No    Lack of Transportation (Non-Medical): No  Physical Activity: Not on file  Stress: Not on file  Social Connections: Not on file  Intimate Partner Violence: Not At Risk (03/27/2023)   Humiliation, Afraid, Rape, and Kick questionnaire    Fear of Current or Ex-Partner: No    Emotionally Abused: No    Physically Abused: No    Sexually Abused: No     BP (!) 122/58   Pulse (!) 58   Ht 5\' 9"  (1.753 m)   Wt 213 lb 6.4 oz (96.8 kg)   SpO2 97%   BMI 31.51 kg/m   Physical Exam:  Well appearing NAD HEENT: Unremarkable Neck:  No JVD, no thyromegally Lymphatics:  No adenopathy Back:  No CVA tenderness Lungs:  Clear HEART:  Regular rate rhythm, no murmurs, no rubs, no clicks Abd:  soft, positive bowel sounds, no organomegally, no rebound, no guarding Ext:  right bka, well healed. Skin:  No rashes no nodules Neuro:  CN II through XII intact, motor grossly intact  EKG - NSR with prolonged QT   Assess/Plan: Prolonged QT - I encouraged him to avoid QT prolonging drugs and continue his coreg. No additional recs at this time. CAD - he denies anginal symptoms but admits to being sedentary. We will follow. HTN - his bp is controlled. Continue. Peripheral vascular diseases  Dorathy Daft

## 2023-05-28 NOTE — Patient Instructions (Signed)
Medication Instructions:  Your physician recommends that you continue on your current medications as directed. Please refer to the Current Medication list given to you today.  *If you need a refill on your cardiac medications before your next appointment, please call your pharmacy*   Lab Work: None ordered.  If you have labs (blood work) drawn today and your tests are completely normal, you will receive your results only by: MyChart Message (if you have MyChart) OR A paper copy in the mail If you have any lab test that is abnormal or we need to change your treatment, we will call you to review the results.   Testing/Procedures: None ordered.    Follow-Up: At [facility], you and your health needs are our priority.  As part of our continuing mission to provide you with exceptional heart care, we have created designated Provider Care Teams.  These Care Teams include your primary Cardiologist (physician) and Advanced Practice Providers (APPs -  Physician Assistants and Nurse Practitioners) who all work together to provide you with the care you need, when you need it.  We recommend signing up for the patient portal called "MyChart".  Sign up information is provided on this After Visit Summary.  MyChart is used to connect with patients for Virtual Visits (Telemedicine).  Patients are able to view lab/test results, encounter notes, upcoming appointments, etc.  Non-urgent messages can be sent to your provider as well.   To learn more about what you can do with MyChart, go to [email or web address].    Your next appointment:   12 months with Dr Ladona Ridgel

## 2023-05-28 NOTE — Telephone Encounter (Signed)
Called [facility] and pt has been scheduled for 06/29/23. When asked which pharmacy to sent prep in to, was told to fax it to the facility. Instructions and prescriptions faxed to facility.

## 2023-06-01 ENCOUNTER — Ambulatory Visit: Payer: Medicare Other | Admitting: Hematology

## 2023-06-03 NOTE — Progress Notes (Signed)
[facility] [place] 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 - Pathology of the sigmoid polypectomy shows invasive colonic adenocarcinoma involving a tubular adenoma.  Carcinoma invades to a depth of 2 mm.  Carcinoma is 1 mm from the margin.  No lymphovascular invasion.  No poorly differentiated component. - Flexible sigmoidoscopy on 08/26/2021 with 15 mm ulcer from previous polypectomy found in the rectum.  This is in the rectum approximately 5 to 8 cm from the anal verge. - CT chest and abdomen with contrast from 09/12/2021 with no sign of metastatic disease in the chest.  Top normal size of left parotic lymph node measuring 10 to 12 mm but with fatty hila, nonspecific. - MRI of the pelvis without contrast from 09/12/2021 was suboptimal due to rectal spasm and gross patient motion which limits assessment.  Site of biopsy not visible and not well assessed.  No gross extracolonic soft tissue.  Potential left meso rectal lymph node within 1 to 2 mm of mesorectal fascia.  - CT pelvis in December 2022 showed scattered pelvic lymph nodes and left para-aortic lymph node. - He was evaluated by Dr. Maisie Fus.  Pathologist reviewed his slides and felt like his cancer was superficial. - He underwent sigmoidoscopy by Dr. Marletta Lor and biopsy at the previous resection site which showed benign colonic  mucosa with benign lymphoid aggregate.  No adenomatous change or malignancy.   Social/family history: - He is a retired Engineer, civil (consulting).  He works for [employer]. - He quit smoking in 1996. - He is currently residing at [facility] in [place] since January 2022.  He was originally admitted there for rehab after cellulitis.  He reports that he will soon be discharged to a community apartment. - Maternal great aunt had breast cancer.  No other malignancies.  Plan: Rectal adenocarcinoma in a polyp, MSI-stable, MMR preserved: - He had a recent change in bowels with diarrhea developing after he was started on Linzess.  He stopped taking Linzess and diarrhea resolved. - I have reviewed CTAP from 02/14/2023: No evidence of recurrence or metastatic disease. - He is having colonoscopy done on 06/29/2023 with Dr. Marletta Lor. - I have reviewed labs from [facility] dated 05/20/2023.  CEA was 1.86. - Recommend follow-up in 6 months with repeat labs including CEA.   2.  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 - Latest labs on 05/20/2023 shows hemoglobin stable between 8-9.  Creatinine is 1.96.  Ferritin is 909 and percent saturation 18. - If the hemoglobin comes down below 8 or requiring transfusions, recommend starting on erythropoiesis stimulating agents.  No orders of the defined types were placed in this encounter.     Alben Deeds Teague,acting as a Neurosurgeon for Doreatha Massed, MD.,have documented all relevant documentation on the behalf of Doreatha Massed, MD,as directed by  Doreatha Massed, MD while in the presence of [facility]  Ellin Saba, MD.   I, Doreatha Massed MD, have reviewed the above documentation for accuracy and completeness, and I agree with the above.   Doreatha Massed, MD   7/25/20244:25 PM  CHIEF COMPLAINT:   Diagnosis: rectal cancer   Cancer Staging  Rectal cancer [facility]) Staging  form: Colon and Rectum, AJCC 8th Edition - Clinical stage from 09/03/2021: Stage 0 (cTis, cN0, cM0) - Unsigned    Prior Therapy: none  Current Therapy:  surveillance   HISTORY OF PRESENT ILLNESS:   Oncology History   No history exists.     INTERVAL HISTORY:   Shane Chambers is a 62 y.o. male presenting to clinic today for follow up of rectal adenocarcinoma. He was last seen by me on 09/23/22.  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 He was admitted to [facility] on 11/05/22 for suicidal ideation from an ED visit that began on 10/27/22. He had MRSA infection on 11/11/22. He then had a washout for revision of his BKA on 11/14/22 with Dr. Gilda Crease. Revision and closure of BKA was on 11/18/22. He was seen in the ED on 12/09/22 for constipation and had a fecal disimpaction on 1/31 before being discharged with Golytely. He was admitted to the ED on 12/17/22 for an infection on his amputation site and underwent revision of his right BKA. He presented to the ED on 02/14/23 for SOB. CT A/P found: possible mild proctocolitis; increased nonspecific perinephric stranding compared to 02/10/2023. This may be related to fluid status/heart failure given pulmonary, mesenteric and body wall edema; interstitial pulmonary edema with small right and trace left pleural effusions; and consolidation in the right middle lobe may reflect atelectasis or pneumonia. US Renal found: trace perinephric fluid adjacent to the inferior pole the LEFT  kidney and mildly prominent bladder walls for degree of distension, likely  reflecting sequela of chronic outlet obstruction. He was given breathing treatment, Ativan, Reglan, insulin, dextrose 50%, calcium gluconate, and IV hydration. He was discharged on  4/12. He was then seen in the ED on 03/26/23 for acute respiratory failure with hypoxia, Chest x-ray showed vascular congestions and was prescribed torsemide 20mg  daily, after beng given albumin 25g in the hospital, and a bronchodilator (albuterol) for bronchospasm. He was also seen in the ED on 04/16/23 for urinary retention. He was on cipro 250mg  BID.   CT A/P ordered by me on 02/10/23 found: no evidence of recurrent or metastatic carcinoma within the abdomen or pelvis; and no  radiographic evidence of cholecystitis.  He has a colonoscopy scheduled on 06/29/23 with Dr. Marletta Lor.   Today, he states that he is doing well overall. His appetite level is at 75%. His energy level is at 65%.  PAST MEDICAL HISTORY:   Past Medical History: Past Medical History:  Diagnosis Date   Anemia    Arthritis    CAD (coronary artery disease)    a. s/p CABG in 03/2020 with LIMA-LAD, RIMA-PL, RA-[id number]-OM1   Cancer (HCC)    rectal   Cellulitis    COPD (chronic obstructive pulmonary disease) (HCC)    Depression    Diabetes mellitus without complication (HCC)    GERD (gastroesophageal reflux disease)    History of kidney stones    Hyperlipidemia 12/01/2019  Hypertension    Hypothyroidism    Ischemic cardiomyopathy    a. EF 20-25% by echo in 02/2020 b. at 40% by echo in 03/2020 c. EF normalized to 60-65% by echo in 04/2020   Myocardial infarction [facility])    Peripheral vascular disease (HCC)    Sleep apnea    Type 2 diabetes mellitus [facility])     Surgical History: Past Surgical History:  Procedure Laterality Date   AMPUTATION Right 10/05/2022   Procedure: AMPUTATION BELOW KNEE;  Surgeon: Nadara Mustard, MD;  [facility];  Service: Orthopedics;  Laterality: Right;   AMPUTATION Right 11/14/2022   Procedure: AMPUTATION BELOW KNEE REVISION; WASHOUT;  Surgeon: Renford Dills, MD;  Location: [facility] ORS;  Service: Vascular;  Laterality: Right;   AMPUTATION Right 11/18/2022   Procedure: AMPUTATION BELOW KNEE  REVISION AND CLOSURE;  Surgeon: Renford Dills, MD;  Location: [facility] ORS;  Service: Vascular;  Laterality: Right;   APPLICATION OF WOUND VAC Right 10/05/2022   Procedure: APPLICATION OF WOUND VAC;  Surgeon: Nadara Mustard, MD;  Location: [facility] OR;  Service: Orthopedics;  Laterality: Right;   BIOPSY  07/18/2021   Procedure: BIOPSY;  Surgeon: Lanelle Bal, DO;  Location: [facility] ENDO SUITE;  Service: Endoscopy;;   BIOPSY  01/09/2022   Procedure: BIOPSY;  Surgeon: Lanelle Bal, DO;  [facility];  Service: Endoscopy;;   COLONOSCOPY WITH PROPOFOL N/A 07/18/2021   Carver: 15 millimeter polyp removed from the sigmoid colon, 5 mm polyp removed from sigmoid colon.  Nonbleeding internal hemorrhoids.  Significant looping of the colon. sigmoid path showed invasive colonic adenocarcinoma involving tubular adenoma (invades to depth of 2mm, carcinoma 1mm from margin, no lymphovascular invasion, no poorly differentiated component.   CORONARY ARTERY BYPASS GRAFT N/A 03/13/2020   Procedure: CORONARY ARTERY BYPASS GRAFTING (CABG) times five using bilateral Internal mammary arteries and left radial artery;  Surgeon: Linden Dolin, MD;  Location: [facility] OR;  Service: Open Heart Surgery;  Laterality: N/A;   DENTAL SURGERY     ESOPHAGOGASTRODUODENOSCOPY (EGD) WITH PROPOFOL N/A 07/18/2021   Carver: 1 gastric polyp status post biopsy, gastritis. gastric bx with slight chronic inflammation and no H.pyori. GEJ polypectomy with mild inflammation only   FLEXIBLE SIGMOIDOSCOPY N/A 08/26/2021   Carver: Nonbleeding internal hemorrhoids.  15 mm ulcers from previous polypectomy found in the rectum.  No evidence of previous polyp.  Located 5 to 8 cm from anal verge.   FLEXIBLE SIGMOIDOSCOPY N/A 01/09/2022   Procedure: FLEXIBLE SIGMOIDOSCOPY;  Surgeon: Lanelle Bal, DO;  [facility];  Service: Endoscopy;  Laterality: N/A;   POLYPECTOMY  07/18/2021   Procedure: POLYPECTOMY INTESTINAL;  Surgeon: Lanelle Bal, DO;  [facility];  Service: Endoscopy;;   RADIAL ARTERY HARVEST Left 03/13/2020   Procedure: RADIAL ARTERY HARVEST,;  Surgeon: Linden Dolin, MD;  Location: [facility] OR;  Service: Open Heart Surgery;  Laterality: Left;   RIGHT/LEFT HEART CATH AND CORONARY ANGIOGRAPHY N/A 03/07/2020   Procedure: RIGHT/LEFT HEART CATH AND CORONARY ANGIOGRAPHY;  Surgeon: Tonny Bollman, MD;  Location: [facility] INVASIVE CV LAB;  Service: Cardiovascular;  Laterality: N/A;   STUMP REVISION Right 12/17/2022   Procedure: RIGHT BELOW KNEE AMPUTATION REVISION;  Surgeon: Nadara Mustard, MD;  Location: [facility] OR;  Service: Orthopedics;  Laterality: Right;   SUBMUCOSAL LIFTING INJECTION  01/09/2022   Procedure: SUBMUCOSAL LIFTING INJECTION;  Surgeon: Lanelle Bal, DO;  [facility];  Service: Endoscopy;;   SUBMUCOSAL  TATTOO INJECTION  01/09/2022   Procedure: SUBMUCOSAL TATTOO INJECTION;  Surgeon: Lanelle Bal, DO;  [facility];  Service: Endoscopy;;   TEE WITHOUT CARDIOVERSION N/A 03/13/2020   Procedure: TRANSESOPHAGEAL ECHOCARDIOGRAM (TEE);  Surgeon: Linden Dolin, MD;  Location: [facility] OR;  Service: Open Heart Surgery;  Laterality: N/A;    Social History: Social History   Socioeconomic History   Marital status: Single    Spouse name: Not on file   Number of children: Not on file   Years of education: Not on file   Highest education level: Not on file  Occupational History   Not on file  Tobacco Use   Smoking status: Former    Current packs/day: 0.00    Types: Cigarettes    Quit date: 05/25/1995    Years since quitting: 28.0   Smokeless tobacco: Never  Vaping Use   Vaping status: Never Used  Substance and Sexual Activity   Alcohol use: Not Currently    Comment: rarely   Drug use: No   Sexual activity: Not Currently  Other Topics Concern   Not on file  Social History Narrative   Not on file   Social Determinants of Health   Financial Resource Strain: Not on file   Food Insecurity: No Food Insecurity (03/27/2023)   Hunger Vital Sign    Worried About Running Out of Food in the Last Year: Never true    Ran Out of Food in the Last Year: Never true  Transportation Needs: No Transportation Needs (03/27/2023)   PRAPARE - Administrator, Civil Service (Medical): No    Lack of Transportation (Non-Medical): No  Physical Activity: Not on file  Stress: Not on file  Social Connections: Not on file  Intimate Partner Violence: Not At Risk (03/27/2023)   Humiliation, Afraid, Rape, and Kick questionnaire    Fear of Current or Ex-Partner: No    Emotionally Abused: No    Physically Abused: No    Sexually Abused: No    Family History: Family History  Problem Relation Age of Onset   Hypertension Mother     Current Medications:  Current Outpatient Medications:    acetaminophen (TYLENOL) 500 MG tablet, Take 1,000 mg by mouth every 8 (eight) hours as needed for fever or moderate pain., Disp: , Rfl:    albuterol (VENTOLIN HFA) 108 (90 Base) MCG/ACT inhaler, Inhale 2 puffs into the lungs every 6 (six) hours as needed for wheezing or shortness of breath., Disp: , Rfl:    aspirin 81 MG EC tablet, Take 1 tablet (81 mg total) by mouth daily with breakfast., Disp: 30 tablet, Rfl: 12   atorvastatin (LIPITOR) 40 MG tablet, Take 1 tablet (40 mg total) by mouth at bedtime., Disp: , Rfl:    bisacodyl 5 MG EC tablet, Take 1 tablet (5 mg total) by mouth daily as needed for moderate constipation., Disp: 30 tablet, Rfl: 0   budesonide-formoterol (SYMBICORT) 160-4.5 MCG/ACT inhaler, Inhale 2 puffs into the lungs in the morning and at bedtime., Disp: 1 Inhaler, Rfl: 6   carvedilol (COREG) 25 MG tablet, Take 1 tablet (25 mg total) by mouth 2 (two) times daily with a meal., Disp: , Rfl:    ferrous gluconate (FERGON) 324 MG tablet, Take 324 mg by mouth daily., Disp: , Rfl:    gabapentin (NEURONTIN) 300 MG capsule, Take 1 capsule (300 mg total) by mouth at bedtime., Disp: ,  Rfl:    Glucerna (GLUCERNA) LIQD, Take 1 Can  by mouth with breakfast, with lunch, and with evening meal., Disp: , Rfl:    hydrALAZINE (APRESOLINE) 100 MG tablet, Take 1 tablet (100 mg total) by mouth every 8 (eight) hours., Disp: , Rfl:    insulin aspart (NOVOLOG FLEXPEN) 100 UNIT/ML FlexPen, 0-15 Units, Subcutaneous, 3 times daily with meals CBG < 70: implement hypoglycemia protocol-call MD CBG 70 - 120: 0 units CBG 121 - 150: 2 units CBG 151 - 200: 3 units CBG 201 - 250: 5 units CBG 251 - 300: 8 units CBG 301 - 350: 11 units CBG 351 - 400: 15 units CBG > 400:, Disp: 15 mL, Rfl: 0   ipratropium-albuterol (DUONEB) 0.5-2.5 (3) MG/3ML SOLN, Take 3 mLs by nebulization every 6 (six) hours as needed. (Patient taking differently: Take 3 mLs by nebulization every 6 (six) hours as needed (SOB/wheezing).), Disp: 360 mL, Rfl:    levothyroxine (SYNTHROID) 75 MCG tablet, Take 75 mcg by mouth daily before breakfast., Disp: , Rfl:    mirtazapine (REMERON) 30 MG tablet, Take 30 mg by mouth at bedtime., Disp: , Rfl:    Multiple Vitamin (MULTIVITAMIN) tablet, Take 1 tablet by mouth daily., Disp: , Rfl:    omeprazole (PRILOSEC) 40 MG capsule, Take 40 mg by mouth in the morning., Disp: , Rfl:    polyethylene glycol (MIRALAX / GLYCOLAX) 17 g packet, Take 17 g by mouth daily as needed for moderate constipation., Disp: , Rfl:    polyvinyl alcohol (LIQUIFILM TEARS) 1.4 % ophthalmic solution, Place 1 drop into both eyes daily., Disp: , Rfl:    tamsulosin (FLOMAX) 0.4 MG CAPS capsule, Take 0.8 mg by mouth every evening., Disp: , Rfl:    thiamine (VITAMIN B-1) 100 MG tablet, Take 1 tablet (100 mg total) by mouth daily., Disp: , Rfl:    torsemide (DEMADEX) 20 MG tablet, Take 1 tablet (20 mg total) by mouth daily., Disp: , Rfl:    venlafaxine XR (EFFEXOR-XR) 75 MG 24 hr capsule, Take 225 mg by mouth daily., Disp: , Rfl:    Vitamin D, Ergocalciferol, (DRISDOL) 1.25 MG (50000 UNIT) CAPS capsule, Take 50,000 Units by mouth every 7  (seven) days. (Mondays), Disp: , Rfl:    Allergies: Allergies  Allergen Reactions   Ace Inhibitors Swelling and Cough    (Not on MAR at [name])   Haloperidol And Related     Do NOT give anti-psychotics due to risk of  Torsades and QT prolongation (per Dr. Teena Irani request)   Other Itching    [name]   Shellfish Allergy     Listed on MAR   Penicillins Itching and Rash    Has patient had a PCN reaction causing immediate rash, facial/tongue/throat swelling, SOB or lightheadedness with hypotension: Unknown Has patient had a PCN reaction causing severe rash involving mucus membranes or skin necrosis: Unknown Has patient had a PCN reaction that required hospitalization: Unknown Has patient had a PCN reaction occurring within the last 10 years: Unknown If all of the above answers are "NO", then may proceed with Cephalosporin use.  Tolerated ancef (12-17-22)     REVIEW OF SYSTEMS:   Review of Systems  Constitutional:  Negative for chills, fatigue and fever.  HENT:   Negative for lump/mass, mouth sores, nosebleeds, sore throat and trouble swallowing.   Eyes:  Negative for eye problems.  Respiratory:  Positive for cough. Negative for shortness of breath.   Cardiovascular:  Negative for chest pain, leg swelling and palpitations.  Gastrointestinal:  Positive for diarrhea and nausea. Negative for abdominal pain, constipation and vomiting.  Genitourinary:  Negative for bladder incontinence, difficulty urinating, dysuria, frequency, hematuria and nocturia.   Musculoskeletal:  Negative for arthralgias, back pain, flank pain, myalgias and neck pain.  Skin:  Negative for itching and rash.  Neurological:  Positive for numbness. Negative for dizziness and headaches.  Hematological:  Does not bruise/bleed easily.  Psychiatric/Behavioral:  Positive for sleep disturbance. Negative for depression and suicidal ideas. The patient is not nervous/anxious.   All other  systems reviewed and are negative.    VITALS:   Blood pressure 139/75, pulse 64, temperature 97.7 F (36.5 C), temperature source Tympanic, resp. rate 16, weight 214 lb (97.1 kg), SpO2 97%.  Wt Readings from Last 3 Encounters:  06/04/23 214 lb (97.1 kg)  05/28/23 213 lb 6.4 oz (96.8 kg)  05/19/23 213 lb 12.8 oz (97 kg)    Body mass index is 31.6 kg/m.  Performance status (ECOG): 2 - Symptomatic, <50% confined to bed  PHYSICAL EXAM:   Physical Exam Vitals and nursing note reviewed. Exam conducted with a chaperone present.  Constitutional:      Appearance: Normal appearance.  Cardiovascular:     Rate and Rhythm: Normal rate and regular rhythm.     Pulses: Normal pulses.     Heart sounds: Normal heart sounds.  Pulmonary:     Effort: Pulmonary effort is normal.     Breath sounds: Normal breath sounds.  Abdominal:     Palpations: Abdomen is soft. There is no hepatomegaly, splenomegaly or mass.     Tenderness: There is no abdominal tenderness.  Musculoskeletal:     Right lower leg: No edema.     Left lower leg: No edema.  Lymphadenopathy:     Cervical: No cervical adenopathy.     Right cervical: No superficial, deep or posterior cervical adenopathy.    Left cervical: No superficial, deep or posterior cervical adenopathy.     Upper Body:     Right upper body: No supraclavicular or axillary adenopathy.     Left upper body: No supraclavicular or axillary adenopathy.  Neurological:     General: No focal deficit present.     Mental Status: He is alert and oriented to person, place, and time.  Psychiatric:        Mood and Affect: Mood normal.        Behavior: Behavior normal.     LABS:      Latest Ref Rng & Units 04/16/2023    9:52 PM 03/31/2023    9:07 AM 03/29/2023    4:31 AM  CBC  WBC 4.0 - 10.5 K/uL 9.5  11.7  13.2   Hemoglobin 13.0 - 17.0 g/dL 8.9  8.9  9.6   Hematocrit 39.0 - 52.0 % 28.2  27.9  30.4   Platelets 150 - 400 K/uL 328  393  376       Latest Ref Rng  & Units 04/16/2023    9:52 PM 04/02/2023    6:25 AM 04/01/2023    7:20 AM  CMP  Glucose 70 - 99 mg/dL 098  96  92   BUN 6 - 20 mg/dL 42  61  56   Creatinine 0.61 - 1.24 mg/dL 1.19  1.47  8.29   Sodium 135 - 145 mmol/L 138  140  141   Potassium 3.5 - 5.1 mmol/L 3.8  4.2  4.2   Chloride 98 - 111 mmol/L 103  105  105  CO2 22 - 32 mmol/L 25  26  26    Calcium 8.9 - 10.3 mg/dL 8.9  8.9  9.2      Lab Results  Component Value Date   CEA1 1.8 09/17/2022   /  CEA  Date Value Ref Range Status  09/17/2022 1.8 0.0 - 4.7 ng/mL Final    Comment:    (NOTE)                             Nonsmokers          <3.9                             Smokers             <5.6 [employer] Electrochemiluminescence Immunoassay (ECLIA) Values obtained with different assay methods or kits cannot be used interchangeably.  Results cannot be interpreted as absolute evidence of the presence or absence of malignant disease. Performed At: [facility] [address] [place], [place] [id number] Jolene Schimke MD MV:[id number]    No results found for: "PSA1" No results found for: "CAN199" No results found for: "CAN125"  No results found for: "TOTALPROTELP", "ALBUMINELP", "A1GS", "A2GS", "BETS", "BETA2SER", "GAMS", "MSPIKE", "SPEI" Lab Results  Component Value Date   TIBC 218 (L) 02/16/2023   TIBC 153 (L) 09/29/2022   TIBC 294 09/17/2022   FERRITIN 282 02/16/2023   FERRITIN 913 (H) 09/29/2022   FERRITIN 249 09/17/2022   IRONPCTSAT 7 (L) 02/16/2023   IRONPCTSAT 7 (L) 09/29/2022   IRONPCTSAT 14 (L) 09/17/2022   Lab Results  Component Value Date   LDH 266 (H) 12/24/2019   LDH 242 (H) 12/01/2019     STUDIES:   No results found.

## 2023-06-04 ENCOUNTER — Other Ambulatory Visit: Payer: Medicare Other

## 2023-06-04 ENCOUNTER — Inpatient Hospital Stay: Payer: Medicare Other | Attending: Hematology | Admitting: Hematology

## 2023-06-04 VITALS — BP 139/75 | HR 64 | Temp 97.7°F | Resp 16 | Wt 214.0 lb

## 2023-06-04 DIAGNOSIS — D631 Anemia in chronic kidney disease: Secondary | ICD-10-CM | POA: Insufficient documentation

## 2023-06-04 DIAGNOSIS — N189 Chronic kidney disease, unspecified: Secondary | ICD-10-CM | POA: Diagnosis not present

## 2023-06-04 DIAGNOSIS — I129 Hypertensive chronic kidney disease with stage 1 through stage 4 chronic kidney disease, or unspecified chronic kidney disease: Secondary | ICD-10-CM | POA: Diagnosis not present

## 2023-06-04 DIAGNOSIS — C20 Malignant neoplasm of rectum: Secondary | ICD-10-CM | POA: Diagnosis not present

## 2023-06-04 DIAGNOSIS — Z87891 Personal history of nicotine dependence: Secondary | ICD-10-CM | POA: Diagnosis not present

## 2023-06-04 NOTE — Patient Instructions (Addendum)
[facility] Discharge Instructions   You were seen and examined today by Dr. Ellin Saba.  He reviewed the results of your lab work. Your hemoglobin is 8.3. The low hemoglobin is a product of your kidney disease. We will keep an eye on this for now. If needed, we can initiate shots to help boost your hemoglobin in the future.   We will see you back in 6 months. We will repeat lab work prior to your next visit.    Thank you for choosing [facility] at [facility] to provide your oncology and hematology care.  To afford each patient quality time with our provider, please arrive at least 15 minutes before your scheduled appointment time.   If you have a lab appointment with the Cancer Center please come in thru the Main Entrance and check in at the main information desk.  You need to re-schedule your appointment should you arrive 10 or more minutes late.  We strive to give you quality time with our providers, and arriving late affects you and other patients whose appointments are after yours.  Also, if you no show three or more times for appointments you may be dismissed from the clinic at the providers discretion.     Again, thank you for choosing [facility].  Our hope is that these requests will decrease the amount of time that you wait before being seen by our physicians.       _____________________________________________________________  Should you have questions after your visit to [facility], please contact our office at [phone] and follow the prompts.  Our office hours are 8:00 a.m. and 4:30 p.m. Monday - Friday.  Please note that voicemails left after 4:00 p.m. may not be returned until the following business day.  We are closed weekends and major holidays.  You do have access to a nurse 24-7, just call the main number to the clinic [phone] and do not press any options, hold on the line and a  nurse will answer the phone.    For prescription refill requests, have your pharmacy contact our office and allow 72 hours.    Due to Covid, you will need to wear a mask upon entering the hospital. If you do not have a mask, a mask will be given to you at the Main Entrance upon arrival. For doctor visits, patients may have 1 support person age 92 or older with them. For treatment visits, patients can not have anyone with them due to social distancing guidelines and our immunocompromised population.

## 2023-06-11 ENCOUNTER — Ambulatory Visit: Payer: Medicare Other | Admitting: Orthopedic Surgery

## 2023-06-11 DIAGNOSIS — Z89511 Acquired absence of right leg below knee: Secondary | ICD-10-CM | POA: Diagnosis not present

## 2023-06-15 ENCOUNTER — Encounter: Payer: Self-pay | Admitting: Internal Medicine

## 2023-06-24 ENCOUNTER — Encounter: Payer: Self-pay | Admitting: Orthopedic Surgery

## 2023-06-24 NOTE — Progress Notes (Signed)
Office Visit Note   Patient: Shane Chambers           Date of Birth: [birth year]           MRN: [id number] Visit Date: 06/11/2023              Requested by: [name], MD [address] [place],  [place] [address] PCP: [name], MD  Chief Complaint  Patient presents with   Right Leg - Follow-up      HPI: Patient is a 62 year old gentleman status post right below-knee amputee.  Patient has been having problems with prosthetic fitting with subsidence into the socket with using additional layers of sock.  Patient is 6 months status post amputation.  Assessment & Plan: Visit Diagnoses:  1. S/P BKA (below knee amputation) unilateral, right (HCC)     Plan: A prescription was provided for biotech for a new socket and liner.  Patient will need a 2 XL shrinker.  Follow-Up Instructions: Return if symptoms worsen or fail to improve.   Ortho Exam  Patient is alert, oriented, no adenopathy, well-dressed, normal affect, normal respiratory effort. Examination the incision is well-healed no swelling no ulcers.  Range of motion 0 to 120 degrees.  Patient is an existing right transtibial  amputee.  Patient's current comorbidities are not expected to impact the ability to function with the prescribed prosthesis. Patient verbally communicates a strong desire to use a prosthesis. Patient currently requires mobility aids to ambulate without a prosthesis.  Expects not to use mobility aids with a new prosthesis.  Patient is a K2 level ambulator that will use a prosthesis to walk around their home and the community over low level environmental barriers.      Imaging: No results found. No images are attached to the encounter.  Labs: Lab Results  Component Value Date   HGBA1C 6.1 (H) 11/07/2022   HGBA1C 5.6 09/26/2022   HGBA1C 11.1 (H) 11/23/2020   ESRSEDRATE 115 (H) 11/21/2022   ESRSEDRATE 81 (H) 11/10/2022   ESRSEDRATE >140 (H) 10/02/2022   CRP 0.7 11/21/2022   CRP 4.7 (H)  11/10/2022   CRP 33.7 (H) 10/02/2022   REPTSTATUS 11/19/2022 FINAL 11/14/2022   GRAMSTAIN  11/14/2022    RARE WBC PRESENT, PREDOMINANTLY PMN NO ORGANISMS SEEN    CULT  11/14/2022    RARE METHICILLIN RESISTANT STAPHYLOCOCCUS AUREUS NO ANAEROBES ISOLATED Performed at [facility], [address], [place], [place] [address]    LABORGA METHICILLIN RESISTANT STAPHYLOCOCCUS AUREUS 11/14/2022     Lab Results  Component Value Date   ALBUMIN 2.8 (L) 03/26/2023   ALBUMIN 2.9 (L) 02/20/2023   ALBUMIN 2.8 (L) 02/19/2023    Lab Results  Component Value Date   MG 1.9 04/01/2023   MG 1.8 03/31/2023   MG 1.7 03/30/2023   Lab Results  Component Value Date   VD25OH 28.25 (L) 11/11/2022   VD25OH 8.61 (L) 12/01/2019    No results found for: "PREALBUMIN"    Latest Ref Rng & Units 04/16/2023    9:52 PM 03/31/2023    9:07 AM 03/29/2023    4:31 AM  CBC EXTENDED  WBC 4.0 - 10.5 K/uL 9.5  11.7  13.2   RBC 4.22 - 5.81 MIL/uL 3.17  3.18  3.42   Hemoglobin 13.0 - 17.0 g/dL 8.9  8.9  9.6   HCT 82.9 - 52.0 % 28.2  27.9  30.4   Platelets 150 - 400 K/uL 328  393  376  NEUT# 1.7 - 7.7 K/uL 6.5     Lymph# 0.7 - 4.0 K/uL 1.3        There is no height or weight on file to calculate BMI.  Orders:  No orders of the defined types were placed in this encounter.  No orders of the defined types were placed in this encounter.    Procedures: No procedures performed  Clinical Data: No additional findings.  ROS:  All other systems negative, except as noted in the HPI. Review of Systems  Objective: Vital Signs: There were no vitals taken for this visit.  Specialty Comments:  No specialty comments available.  PMFS History: Patient Active Problem List   Diagnosis Date Noted   Acute on chronic heart failure with preserved ejection fraction (HFpEF) (HCC) 03/27/2023   COPD with acute exacerbation (HCC) 03/27/2023   Dyslipidemia 03/27/2023   Type 2 diabetes mellitus without  complications (HCC) 03/27/2023   Hypothyroidism 03/27/2023   CKD stage 3a, GFR 45-59 ml/min (HCC) 03/27/2023   Moderate aortic stenosis 03/27/2023   Acute urinary retention 03/27/2023   Hyperkalemia 02/15/2023   Pulmonary edema 02/15/2023   Bilateral pleural effusion 02/15/2023   Nausea & vomiting 02/15/2023   Chronic diarrhea 02/15/2023   Obesity (BMI 30-39.9) 02/15/2023   High anion gap metabolic acidosis 02/14/2023   S/P BKA (below knee amputation) unilateral, right (HCC) 12/17/2022   MRSA infection 11/19/2022   Amputation stump infection (HCC) 11/19/2022   QT prolongation 11/10/2022   Severe recurrent major depression without psychotic features (HCC) 11/05/2022   Major depressive disorder with psychotic features (HCC) 11/02/2022   Acute hypoactive delirium due to another medical condition 10/13/2022   Septic arthritis of right ankle (HCC) 10/04/2022   Acute osteomyelitis (HCC) 10/02/2022   Subacute osteomyelitis, right ankle and foot (HCC) 10/02/2022   Acquired hypothyroidism 10/02/2022   Hypoalbuminemia due to protein-calorie malnutrition (HCC) 10/02/2022   Cellulitis of right lower extremity 09/25/2022   Lactic acidosis 04/23/2022   Anemia of chronic disease 04/23/2022   Morbid obesity (HCC) 04/23/2022   AKI (acute kidney injury) (HCC) 04/22/2022   Elevated troponin 04/22/2022   Cancer of sigmoid colon (HCC) 12/16/2021   Constipation 12/16/2021   GERD without esophagitis 12/16/2021   Iron deficiency anemia 09/17/2021   Rectal cancer (HCC) 09/03/2021   Bilateral lower leg cellulitis 11/22/2020   Systolic and diastolic CHF, chronic (HCC) 11/22/2020   CHF exacerbation (HCC) 08/01/2020   Visit for wound check 05/28/2020   OSA (obstructive sleep apnea) 05/24/2020   COPD (chronic obstructive pulmonary disease) (HCC) 04/29/2020   Leukocytosis 04/29/2020   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   Ischemic cardiomyopathy    Coronary artery disease    Acute on chronic combined systolic and  diastolic CHF (congestive heart failure) (HCC) 03/05/2020   Acute hyponatremia 03/05/2020   Acute respiratory failure with hypoxia (HCC) 12/21/2019   Severe Vitamin D deficiency 12/02/2019   Hypokalemia 12/01/2019   Mixed hyperlipidemia 12/01/2019   BPH (benign prostatic hyperplasia) 12/01/2019   Normocytic anemia 12/01/2019   Pneumonia due to COVID-19 virus 12/01/2019   Hypoxia    SOB (shortness of breath) 11/16/2019   Severe sepsis (HCC) 11/16/2019   Essential hypertension    Uncontrolled type 2 diabetes mellitus with hypoglycemia, with long-term current use of insulin (HCC)    Depression    Acute bronchitis with bronchospasm 11/15/2019   Past Medical History:  Diagnosis Date   Anemia    Arthritis    CAD (coronary artery disease)  a. s/p CABG in 03/2020 with LIMA-LAD, RIMA-PL, RA-[id number]-OM1   Cancer (HCC)    rectal   Cellulitis    COPD (chronic obstructive pulmonary disease) (HCC)    Depression    Diabetes mellitus without complication (HCC)    GERD (gastroesophageal reflux disease)    History of kidney stones    Hyperlipidemia 12/01/2019   Hypertension    Hypothyroidism    Ischemic cardiomyopathy    a. EF 20-25% by echo in 02/2020 b. at 40% by echo in 03/2020 c. EF normalized to 60-65% by echo in 04/2020   Myocardial infarction [facility])    Peripheral vascular disease (HCC)    Sleep apnea    Type 2 diabetes mellitus (HCC)     Family History  Problem Relation Age of Onset   Hypertension Mother     Past Surgical History:  Procedure Laterality Date   AMPUTATION Right 10/05/2022   Procedure: AMPUTATION BELOW KNEE;  Surgeon: Nadara Mustard, MD;  Location: [facility] OR;  Service: Orthopedics;  Laterality: Right;   AMPUTATION Right 11/14/2022   Procedure: AMPUTATION BELOW KNEE REVISION; WASHOUT;  Surgeon: Renford Dills, MD;  Location: [facility] ORS;  Service: Vascular;  Laterality: Right;   AMPUTATION Right 11/18/2022   Procedure: AMPUTATION BELOW KNEE REVISION AND CLOSURE;   Surgeon: Renford Dills, MD;  Location: [facility] ORS;  Service: Vascular;  Laterality: Right;   APPLICATION OF WOUND VAC Right 10/05/2022   Procedure: APPLICATION OF WOUND VAC;  Surgeon: Nadara Mustard, MD;  Location: [facility] OR;  Service: Orthopedics;  Laterality: Right;   BIOPSY  07/18/2021   Procedure: BIOPSY;  Surgeon: Lanelle Bal, DO;  Location: [facility];  Service: Endoscopy;;   BIOPSY  01/09/2022   Procedure: BIOPSY;  Surgeon: Lanelle Bal, DO;  Location: [facility];  Service: Endoscopy;;   COLONOSCOPY WITH PROPOFOL N/A 07/18/2021   Carver: 15 millimeter polyp removed from the sigmoid colon, 5 mm polyp removed from sigmoid colon.  Nonbleeding internal hemorrhoids.  Significant looping of the colon. sigmoid path showed invasive colonic adenocarcinoma involving tubular adenoma (invades to depth of 2mm, carcinoma 1mm from margin, no lymphovascular invasion, no poorly differentiated component.   CORONARY ARTERY BYPASS GRAFT N/A 03/13/2020   Procedure: CORONARY ARTERY BYPASS GRAFTING (CABG) times five using bilateral Internal mammary arteries and left radial artery;  Surgeon: Linden Dolin, MD;  Location: [facility] OR;  Service: Open Heart Surgery;  Laterality: N/A;   DENTAL SURGERY     ESOPHAGOGASTRODUODENOSCOPY (EGD) WITH PROPOFOL N/A 07/18/2021   Carver: 1 gastric polyp status post biopsy, gastritis. gastric bx with slight chronic inflammation and no H.pyori. GEJ polypectomy with mild inflammation only   FLEXIBLE SIGMOIDOSCOPY N/A 08/26/2021   Carver: Nonbleeding internal hemorrhoids.  15 mm ulcers from previous polypectomy found in the rectum.  No evidence of previous polyp.  Located 5 to 8 cm from anal verge.   FLEXIBLE SIGMOIDOSCOPY N/A 01/09/2022   Procedure: FLEXIBLE SIGMOIDOSCOPY;  Surgeon: Lanelle Bal, DO;  [facility]: [facility];  Service: Endoscopy;  Laterality: N/A;   POLYPECTOMY  07/18/2021   Procedure: POLYPECTOMY INTESTINAL;  Surgeon: Lanelle Bal, DO;   Location: [facility];  Service: Endoscopy;;   RADIAL ARTERY HARVEST Left 03/13/2020   Procedure: RADIAL ARTERY HARVEST,;  Surgeon: Linden Dolin, MD;  Location: [facility] OR;  Service: Open Heart Surgery;  Laterality: Left;   RIGHT/LEFT HEART CATH AND CORONARY ANGIOGRAPHY N/A 03/07/2020   Procedure: RIGHT/LEFT HEART CATH AND CORONARY ANGIOGRAPHY;  Surgeon: Tonny Bollman, MD;  Location: [facility] INVASIVE CV LAB;  Service: Cardiovascular;  Laterality: N/A;   STUMP REVISION Right 12/17/2022   Procedure: RIGHT BELOW KNEE AMPUTATION REVISION;  Surgeon: Nadara Mustard, MD;  Location: [facility] OR;  Service: Orthopedics;  Laterality: Right;   SUBMUCOSAL LIFTING INJECTION  01/09/2022   Procedure: SUBMUCOSAL LIFTING INJECTION;  Surgeon: Lanelle Bal, DO;  Location[facility];  Service: Endoscopy;;   SUBMUCOSAL TATTOO INJECTION  01/09/2022   Procedure: SUBMUCOSAL TATTOO INJECTION;  Surgeon: Lanelle Bal, DO;  [facility];  Service: Endoscopy;;   TEE WITHOUT CARDIOVERSION N/A 03/13/2020   Procedure: TRANSESOPHAGEAL ECHOCARDIOGRAM (TEE);  Surgeon: Linden Dolin, MD;  Location: [facility] OR;  Service: Open Heart Surgery;  Laterality: N/A;   Social History   Occupational History   Not on file  Tobacco Use   Smoking status: Former    Current packs/day: 0.00    Types: Cigarettes    Quit date: 05/25/1995    Years since quitting: 28.1   Smokeless tobacco: Never  Vaping Use   Vaping status: Never Used  Substance and Sexual Activity   Alcohol use: Not Currently    Comment: rarely   Drug use: No   Sexual activity: Not Currently

## 2023-06-25 ENCOUNTER — Encounter (HOSPITAL_COMMUNITY)
Admission: RE | Admit: 2023-06-25 | Discharge: 2023-06-25 | Disposition: A | Discharge status: Home / Self Care | Payer: Medicare Other | Source: Ambulatory Visit | Attending: Internal Medicine | Admitting: Internal Medicine

## 2023-06-29 ENCOUNTER — Ambulatory Visit (HOSPITAL_COMMUNITY)
Admission: RE | Admit: 2023-06-29 | Discharge: 2023-06-29 | Disposition: A | Discharge status: Home / Self Care | Payer: Medicare Other | Attending: Internal Medicine | Admitting: Internal Medicine

## 2023-06-29 ENCOUNTER — Ambulatory Visit (HOSPITAL_COMMUNITY): Payer: Medicare Other | Admitting: Certified Registered"

## 2023-06-29 ENCOUNTER — Ambulatory Visit (HOSPITAL_BASED_OUTPATIENT_CLINIC_OR_DEPARTMENT_OTHER): Payer: Medicare Other | Admitting: Certified Registered"

## 2023-06-29 ENCOUNTER — Encounter (HOSPITAL_COMMUNITY)
Admission: RE | Disposition: A | Discharge status: Home / Self Care | Payer: Self-pay | Source: Home / Self Care | Attending: Internal Medicine

## 2023-06-29 ENCOUNTER — Encounter (HOSPITAL_COMMUNITY): Payer: Self-pay

## 2023-06-29 ENCOUNTER — Other Ambulatory Visit: Payer: Self-pay

## 2023-06-29 DIAGNOSIS — D126 Benign neoplasm of colon, unspecified: Secondary | ICD-10-CM

## 2023-06-29 DIAGNOSIS — I2510 Atherosclerotic heart disease of native coronary artery without angina pectoris: Secondary | ICD-10-CM

## 2023-06-29 DIAGNOSIS — G709 Myoneural disorder, unspecified: Secondary | ICD-10-CM | POA: Diagnosis not present

## 2023-06-29 DIAGNOSIS — I509 Heart failure, unspecified: Secondary | ICD-10-CM | POA: Diagnosis not present

## 2023-06-29 DIAGNOSIS — Z8701 Personal history of pneumonia (recurrent): Secondary | ICD-10-CM | POA: Insufficient documentation

## 2023-06-29 DIAGNOSIS — D120 Benign neoplasm of cecum: Secondary | ICD-10-CM | POA: Diagnosis not present

## 2023-06-29 DIAGNOSIS — Z85038 Personal history of other malignant neoplasm of large intestine: Secondary | ICD-10-CM

## 2023-06-29 DIAGNOSIS — E1151 Type 2 diabetes mellitus with diabetic peripheral angiopathy without gangrene: Secondary | ICD-10-CM | POA: Diagnosis not present

## 2023-06-29 DIAGNOSIS — Z9889 Other specified postprocedural states: Secondary | ICD-10-CM | POA: Insufficient documentation

## 2023-06-29 DIAGNOSIS — Z951 Presence of aortocoronary bypass graft: Secondary | ICD-10-CM | POA: Insufficient documentation

## 2023-06-29 DIAGNOSIS — Z87891 Personal history of nicotine dependence: Secondary | ICD-10-CM | POA: Diagnosis not present

## 2023-06-29 DIAGNOSIS — I252 Old myocardial infarction: Secondary | ICD-10-CM | POA: Insufficient documentation

## 2023-06-29 DIAGNOSIS — Z85048 Personal history of other malignant neoplasm of rectum, rectosigmoid junction, and anus: Secondary | ICD-10-CM | POA: Diagnosis present

## 2023-06-29 DIAGNOSIS — G4730 Sleep apnea, unspecified: Secondary | ICD-10-CM | POA: Insufficient documentation

## 2023-06-29 DIAGNOSIS — Z1211 Encounter for screening for malignant neoplasm of colon: Secondary | ICD-10-CM

## 2023-06-29 DIAGNOSIS — J449 Chronic obstructive pulmonary disease, unspecified: Secondary | ICD-10-CM | POA: Insufficient documentation

## 2023-06-29 DIAGNOSIS — D649 Anemia, unspecified: Secondary | ICD-10-CM | POA: Insufficient documentation

## 2023-06-29 DIAGNOSIS — F32A Depression, unspecified: Secondary | ICD-10-CM | POA: Insufficient documentation

## 2023-06-29 DIAGNOSIS — Z8249 Family history of ischemic heart disease and other diseases of the circulatory system: Secondary | ICD-10-CM | POA: Insufficient documentation

## 2023-06-29 DIAGNOSIS — E039 Hypothyroidism, unspecified: Secondary | ICD-10-CM | POA: Insufficient documentation

## 2023-06-29 DIAGNOSIS — K6389 Other specified diseases of intestine: Secondary | ICD-10-CM

## 2023-06-29 DIAGNOSIS — K6289 Other specified diseases of anus and rectum: Secondary | ICD-10-CM | POA: Diagnosis not present

## 2023-06-29 DIAGNOSIS — K648 Other hemorrhoids: Secondary | ICD-10-CM | POA: Diagnosis not present

## 2023-06-29 DIAGNOSIS — Z794 Long term (current) use of insulin: Secondary | ICD-10-CM | POA: Diagnosis not present

## 2023-06-29 DIAGNOSIS — Z08 Encounter for follow-up examination after completed treatment for malignant neoplasm: Secondary | ICD-10-CM

## 2023-06-29 DIAGNOSIS — K219 Gastro-esophageal reflux disease without esophagitis: Secondary | ICD-10-CM | POA: Diagnosis not present

## 2023-06-29 DIAGNOSIS — I110 Hypertensive heart disease with heart failure: Secondary | ICD-10-CM | POA: Insufficient documentation

## 2023-06-29 HISTORY — PX: POLYPECTOMY: SHX149

## 2023-06-29 HISTORY — PX: COLONOSCOPY WITH PROPOFOL: SHX5780

## 2023-06-29 HISTORY — PX: BIOPSY: SHX5522

## 2023-06-29 LAB — GLUCOSE, CAPILLARY: Glucose-Capillary: 86 mg/dL (ref 70–99)

## 2023-06-29 SURGERY — COLONOSCOPY WITH PROPOFOL
Anesthesia: General

## 2023-06-29 MED ORDER — PHENYLEPHRINE 80 MCG/ML (10ML) SYRINGE FOR IV PUSH (FOR BLOOD PRESSURE SUPPORT)
PREFILLED_SYRINGE | INTRAVENOUS | Status: AC
  Filled 2023-06-29: qty 10

## 2023-06-29 MED ORDER — LIDOCAINE HCL (CARDIAC) PF 100 MG/5ML IV SOSY
PREFILLED_SYRINGE | INTRAVENOUS | Status: DC | PRN
  Administered 2023-06-29: 50 mg via INTRAVENOUS

## 2023-06-29 MED ORDER — LACTATED RINGERS IV SOLN: INTRAVENOUS | Status: DC | PRN

## 2023-06-29 MED ORDER — PROPOFOL 10 MG/ML IV BOLUS
INTRAVENOUS | Status: DC | PRN
Start: 2023-06-29 — End: 2023-06-29
  Administered 2023-06-29 (×2): 50 mg via INTRAVENOUS
  Administered 2023-06-29: 30 mg via INTRAVENOUS
  Administered 2023-06-29: 100 mg via INTRAVENOUS
  Administered 2023-06-29: 50 mg via INTRAVENOUS

## 2023-06-29 MED ORDER — PHENYLEPHRINE 80 MCG/ML (10ML) SYRINGE FOR IV PUSH (FOR BLOOD PRESSURE SUPPORT)
PREFILLED_SYRINGE | INTRAVENOUS | Status: DC | PRN
  Administered 2023-06-29 (×4): 160 ug via INTRAVENOUS

## 2023-06-29 MED ORDER — LINACLOTIDE 145 MCG PO CAPS: 145.0000 ug | ORAL_CAPSULE | Freq: Every day | ORAL | 3 refills | Status: DC

## 2023-06-29 NOTE — Anesthesia Preprocedure Evaluation (Signed)
Anesthesia Evaluation  Patient identified by MRN, date of birth, ID band Patient awake    Reviewed: Allergy & Precautions, H&P , NPO status , Patient's Chart, lab work & pertinent test results, reviewed documented beta blocker date and time   Airway Mallampati: II  TM Distance: >3 FB Neck ROM: full    Dental no notable dental hx.    Pulmonary neg pulmonary ROS, sleep apnea , pneumonia, COPD, former smoker   Pulmonary exam normal breath sounds clear to auscultation       Cardiovascular Exercise Tolerance: Good hypertension, + CAD, + Past MI, + CABG, + Peripheral Vascular Disease and +CHF   Rhythm:regular Rate:Normal     Neuro/Psych  PSYCHIATRIC DISORDERS  Depression     Neuromuscular disease negative neurological ROS  negative psych ROS   GI/Hepatic negative GI ROS, Neg liver ROS,GERD  ,,  Endo/Other  negative endocrine ROSdiabetesHypothyroidism    Renal/GU Renal diseasenegative Renal ROS  negative genitourinary   Musculoskeletal   Abdominal   Peds  Hematology negative hematology ROS (+) Blood dyscrasia, anemia   Anesthesia Other Findings   Reproductive/Obstetrics negative OB ROS                             Anesthesia Physical Anesthesia Plan  ASA: 3  Anesthesia Plan: General   Post-op Pain Management:    Induction:   PONV Risk Score and Plan: Propofol infusion  Airway Management Planned:   Additional Equipment:   Intra-op Plan:   Post-operative Plan:   Informed Consent: I have reviewed the patients History and Physical, chart, labs and discussed the procedure including the risks, benefits and alternatives for the proposed anesthesia with the patient or authorized representative who has indicated his/her understanding and acceptance.     Dental Advisory Given  Plan Discussed with: CRNA  Anesthesia Plan Comments:        Anesthesia Quick Evaluation

## 2023-06-29 NOTE — H&P (Signed)
Primary Care Physician:  [name], MD Primary Gastroenterologist:  Dr. Marletta Lor  Pre-Procedure History & Physical: HPI:  Shane Chambers is a 62 y.o. male is here for a colonoscopy to be performed for surveillance purposes, personal history of rectal polyp with carcinoma.   Past Medical History:  Diagnosis Date   Anemia    Arthritis    CAD (coronary artery disease)    a. s/p CABG in 03/2020 with LIMA-LAD, RIMA-PL, RA-D1-RI-OM1   Cancer (HCC)    rectal   Cellulitis    COPD (chronic obstructive pulmonary disease) (HCC)    Depression    Diabetes mellitus without complication (HCC)    GERD (gastroesophageal reflux disease)    History of kidney stones    Hyperlipidemia 12/01/2019   Hypertension    Hypothyroidism    Ischemic cardiomyopathy    a. EF 20-25% by echo in 02/2020 b. at 40% by echo in 03/2020 c. EF normalized to 60-65% by echo in 04/2020   Myocardial infarction [facility])    Peripheral vascular disease (HCC)    Sleep apnea    Type 2 diabetes mellitus (HCC)     Past Surgical History:  Procedure Laterality Date   AMPUTATION Right 10/05/2022   Procedure: AMPUTATION BELOW KNEE;  Surgeon: Nadara Mustard, MD;  Location: [facility] OR;  Service: Orthopedics;  Laterality: Right;   AMPUTATION Right 11/14/2022   Procedure: AMPUTATION BELOW KNEE REVISION; WASHOUT;  Surgeon: Renford Dills, MD;  Location: [facility] ORS;  Service: Vascular;  Laterality: Right;   AMPUTATION Right 11/18/2022   Procedure: AMPUTATION BELOW KNEE REVISION AND CLOSURE;  Surgeon: Renford Dills, MD;  Location: [facility] ORS;  Service: Vascular;  Laterality: Right;   APPLICATION OF WOUND VAC Right 10/05/2022   Procedure: APPLICATION OF WOUND VAC;  Surgeon: Nadara Mustard, MD;  Location: [facility] OR;  Service: Orthopedics;  Laterality: Right;   BIOPSY  07/18/2021   Procedure: BIOPSY;  Surgeon: Lanelle Bal, DO;  Location: [facility];  Service: Endoscopy;;   BIOPSY  01/09/2022   Procedure: BIOPSY;  Surgeon: Lanelle Bal, DO;  Location: [facility];  Service: Endoscopy;;   COLONOSCOPY WITH PROPOFOL N/A 07/18/2021   Shane Chambers: 15 millimeter polyp removed from the sigmoid colon, 5 mm polyp removed from sigmoid colon.  Nonbleeding internal hemorrhoids.  Significant looping of the colon. sigmoid path showed invasive colonic adenocarcinoma involving tubular adenoma (invades to depth of 2mm, carcinoma 1mm from margin, no lymphovascular invasion, no poorly differentiated component.   CORONARY ARTERY BYPASS GRAFT N/A 03/13/2020   Procedure: CORONARY ARTERY BYPASS GRAFTING (CABG) times five using bilateral Internal mammary arteries and left radial artery;  Surgeon: Linden Dolin, MD;  Location: [facility] OR;  Service: Open Heart Surgery;  Laterality: N/A;   DENTAL SURGERY     ESOPHAGOGASTRODUODENOSCOPY (EGD) WITH PROPOFOL N/A 07/18/2021   Shane Chambers: 1 gastric polyp status post biopsy, gastritis. gastric bx with slight chronic inflammation and no H.pyori. GEJ polypectomy with mild inflammation only   FLEXIBLE SIGMOIDOSCOPY N/A 08/26/2021   Shane Chambers: Nonbleeding internal hemorrhoids.  15 mm ulcers from previous polypectomy found in the rectum.  No evidence of previous polyp.  Located 5 to 8 cm from anal verge.   FLEXIBLE SIGMOIDOSCOPY N/A 01/09/2022   Procedure: FLEXIBLE SIGMOIDOSCOPY;  Surgeon: Lanelle Bal, DO;  [facility];  Service: Endoscopy;  Laterality: N/A;   POLYPECTOMY  07/18/2021   Procedure: POLYPECTOMY INTESTINAL;  Surgeon: Lanelle Bal, DO;  Location: [facility];  Service: Endoscopy;;   RADIAL ARTERY HARVEST Left 03/13/2020   Procedure: RADIAL ARTERY HARVEST,;  Surgeon: Linden Dolin, MD;  Location: [facility] OR;  Service: Open Heart Surgery;  Laterality: Left;   RIGHT/LEFT HEART CATH AND CORONARY ANGIOGRAPHY N/A 03/07/2020   Procedure: RIGHT/LEFT HEART CATH AND CORONARY ANGIOGRAPHY;  Surgeon: Tonny Bollman, MD;  Location: [facility] INVASIVE CV LAB;  Service: Cardiovascular;  Laterality: N/A;   STUMP  REVISION Right 12/17/2022   Procedure: RIGHT BELOW KNEE AMPUTATION REVISION;  Surgeon: Nadara Mustard, MD;  Location: [facility] OR;  Service: Orthopedics;  Laterality: Right;   SUBMUCOSAL LIFTING INJECTION  01/09/2022   Procedure: SUBMUCOSAL LIFTING INJECTION;  Surgeon: Lanelle Bal, DO;  [facility];  Service: Endoscopy;;   SUBMUCOSAL TATTOO INJECTION  01/09/2022   Procedure: SUBMUCOSAL TATTOO INJECTION;  Surgeon: Lanelle Bal, DO;  Location: [facility];  Service: Endoscopy;;   TEE WITHOUT CARDIOVERSION N/A 03/13/2020   Procedure: TRANSESOPHAGEAL ECHOCARDIOGRAM (TEE);  Surgeon: Linden Dolin, MD;  Location: [facility] OR;  Service: Open Heart Surgery;  Laterality: N/A;    Prior to Admission medications   Medication Sig Start Date End Date Taking? Authorizing Provider  atorvastatin (LIPITOR) 40 MG tablet Take 1 tablet (40 mg total) by mouth at bedtime. 11/28/22  Yes Gillis Santa, MD  bisacodyl 5 MG EC tablet Take 1 tablet (5 mg total) by mouth daily as needed for moderate constipation. 05/28/23  Yes Lanelle Bal, DO  carvedilol (COREG) 25 MG tablet Take 1 tablet (25 mg total) by mouth 2 (two) times daily with a meal. 04/02/23  Yes Sharl Ma, Sarina Ill, MD  ferrous gluconate (FERGON) 324 MG tablet Take 324 mg by mouth daily.   Yes [provider]  gabapentin (NEURONTIN) 300 MG capsule Take 1 capsule (300 mg total) by mouth at bedtime. 02/19/23  Yes Vassie Loll, MD  hydrALAZINE (APRESOLINE) 100 MG tablet Take 1 tablet (100 mg total) by mouth every 8 (eight) hours. 04/02/23  Yes Sharl Ma, Sarina Ill, MD  insulin aspart (NOVOLOG FLEXPEN) 100 UNIT/ML FlexPen 0-15 Units, Subcutaneous, 3 times daily with meals CBG < 70: implement hypoglycemia protocol-call MD CBG 70 - 120: 0 units CBG 121 - 150: 2 units CBG 151 - 200: 3 units CBG 201 - 250: 5 units CBG 251 - 300: 8 units CBG 301 - 350: 11 units CBG 351 - 400: 15 units CBG > 400: 10/17/22  Yes Ghimire, Werner Lean, MD  mirtazapine (REMERON)  30 MG tablet Take 30 mg by mouth at bedtime.   Yes [provider]  Multiple Vitamin (MULTIVITAMIN) tablet Take 1 tablet by mouth daily.   Yes [provider]  omeprazole (PRILOSEC) 40 MG capsule Take 40 mg by mouth in the morning. 01/01/22  Yes [provider]  tamsulosin (FLOMAX) 0.4 MG CAPS capsule Take 0.8 mg by mouth every evening.   Yes [provider]  thiamine (VITAMIN B-1) 100 MG tablet Take 1 tablet (100 mg total) by mouth daily. 10/18/22  Yes Ghimire, Werner Lean, MD  torsemide (DEMADEX) 20 MG tablet Take 1 tablet (20 mg total) by mouth daily. 04/03/23  Yes Meredeth Ide, MD  venlafaxine XR (EFFEXOR-XR) 75 MG 24 hr capsule Take 225 mg by mouth daily. 03/11/23  Yes [provider]  Vitamin D, Ergocalciferol, (DRISDOL) 1.25 MG (50000 UNIT) CAPS capsule Take 50,000 Units by mouth every 7 (seven) days. (Mondays)   Yes [provider]  acetaminophen (TYLENOL) 500 MG tablet Take 1,000 mg  by mouth every 8 (eight) hours as needed for fever or moderate pain.    [provider]  albuterol (VENTOLIN HFA) 108 (90 Base) MCG/ACT inhaler Inhale 2 puffs into the lungs every 6 (six) hours as needed for wheezing or shortness of breath. 05/02/22   Cleora Fleet, MD  aspirin 81 MG EC tablet Take 1 tablet (81 mg total) by mouth daily with breakfast. 11/29/20   Shon Hale, MD  budesonide-formoterol (SYMBICORT) 160-4.5 MCG/ACT inhaler Inhale 2 puffs into the lungs in the morning and at bedtime. 05/24/20   Oretha Milch, MD  Glucerna [facility]) LIQD Take 1 Can by mouth with breakfast, with lunch, and with evening meal.    [provider]  ipratropium-albuterol (DUONEB) 0.5-2.5 (3) MG/3ML SOLN Take 3 mLs by nebulization every 6 (six) hours as needed. Patient taking differently: Take 3 mLs by nebulization every 6 (six) hours as needed (SOB/wheezing). 10/17/22   Ghimire, Werner Lean, MD  levothyroxine (SYNTHROID) 75 MCG tablet Take 75 mcg by  mouth daily before breakfast.    [provider]  polyethylene glycol (MIRALAX / GLYCOLAX) 17 g packet Take 17 g by mouth daily as needed for moderate constipation.    [provider]  polyvinyl alcohol (LIQUIFILM TEARS) 1.4 % ophthalmic solution Place 1 drop into both eyes daily.    [provider]    Allergies as of 05/28/2023 - Review Complete 05/28/2023  Allergen Reaction Noted   Ace inhibitors Swelling and Cough 12/11/2017   Haloperidol and related  10/14/2022   Other Itching 11/15/2019   Shellfish allergy  10/05/2022   Penicillins Itching and Rash 12/11/2017    Family History  Problem Relation Age of Onset   Hypertension Mother     Social History   Socioeconomic History   Marital status: Single    Spouse name: Not on file   Number of children: Not on file   Years of education: Not on file   Highest education level: Not on file  Occupational History   Not on file  Tobacco Use   Smoking status: Former    Current packs/day: 0.00    Types: Cigarettes    Quit date: 05/25/1995    Years since quitting: 28.1   Smokeless tobacco: Never  Vaping Use   Vaping status: Never Used  Substance and Sexual Activity   Alcohol use: Not Currently    Comment: rarely   Drug use: No   Sexual activity: Not Currently  Other Topics Concern   Not on file  Social History Narrative   Not on file   Social Determinants of Health   Financial Resource Strain: Not on file  Food Insecurity: No Food Insecurity (03/27/2023)   Hunger Vital Sign    Worried About Running Out of Food in the Last Year: Never true    Ran Out of Food in the Last Year: Never true  Transportation Needs: No Transportation Needs (03/27/2023)   PRAPARE - Administrator, Civil Service (Medical): No    Lack of Transportation (Non-Medical): No  Physical Activity: Not on file  Stress: Not on file  Social Connections: Not on file  Intimate Partner Violence: Not At Risk (03/27/2023)    Humiliation, Afraid, Rape, and Kick questionnaire    Chambers of Current or Ex-Partner: No    Emotionally Abused: No    Physically Abused: No    Sexually Abused: No    Review of Systems: See HPI, otherwise negative ROS  Physical Exam: Vital  signs in last 24 hours: Temp:  [97.9 F (36.6 C)] 97.9 F (36.6 C) (08/19 0729) Pulse Rate:  [73] 73 (08/19 0729) Resp:  [17] 17 (08/19 0729) BP: (166)/(67) 166/67 (08/19 0729) SpO2:  [97 %] 97 % (08/19 0729) Weight:  [97.5 kg] 97.5 kg (08/19 0729)   General:   Alert,  Well-developed, well-nourished, pleasant and cooperative in NAD Head:  Normocephalic and atraumatic. Eyes:  Sclera clear, no icterus.   Conjunctiva pink. Ears:  Normal auditory acuity. Nose:  No deformity, discharge,  or lesions. Msk:  Symmetrical without gross deformities. Normal posture. Extremities:  Without clubbing or edema. Neurologic:  Alert and  oriented x4;  grossly normal neurologically. Skin:  Intact without significant lesions or rashes. Psych:  Alert and cooperative. Normal mood and affect.  Impression/Plan: Shane Chambers is here for a colonoscopy to be performed for surveillance purposes, personal history of rectal polyp with carcinoma.   The risks of the procedure including infection, bleed, or perforation as well as benefits, limitations, alternatives and imponderables have been reviewed with the patient. Questions have been answered. All parties agreeable.

## 2023-06-29 NOTE — Transfer of Care (Signed)
Immediate Anesthesia Transfer of Care Note  Patient: Shane Chambers  Procedure(s) Performed: COLONOSCOPY WITH PROPOFOL POLYPECTOMY INTESTINAL BIOPSY  Patient Location: Short Stay  Anesthesia Type:General  Level of Consciousness: drowsy  Airway & Oxygen Therapy: Patient Spontanous Breathing  Post-op Assessment: Report given to RN and Post -op Vital signs reviewed and stable  Post vital signs: Reviewed and stable  Last Vitals:  Vitals Value Taken Time  BP    Temp    Pulse    Resp    SpO2      Last Pain:  Vitals:   06/29/23 0735  TempSrc:   PainSc: 0-No pain         Complications: No notable events documented.

## 2023-06-29 NOTE — Discharge Instructions (Signed)
  Colonoscopy Discharge Instructions  Read the instructions outlined below and refer to this sheet in the next few weeks. These discharge instructions provide you with general information on caring for yourself after you leave the hospital. Your doctor may also give you specific instructions. While your treatment has been planned according to the most current medical practices available, unavoidable complications occasionally occur.   ACTIVITY You may resume your regular activity, but move at a slower pace for the next 24 hours.  Take frequent rest periods for the next 24 hours.  Walking will help get rid of the air and reduce the bloated feeling in your belly (abdomen).  No driving for 24 hours (because of the medicine (anesthesia) used during the test).   Do not sign any important legal documents or operate any machinery for 24 hours (because of the anesthesia used during the test).  NUTRITION Drink plenty of fluids.  You may resume your normal diet as instructed by your doctor.  Begin with a light meal and progress to your normal diet. Heavy or fried foods are harder to digest and may make you feel sick to your stomach (nauseated).  Avoid alcoholic beverages for 24 hours or as instructed.  MEDICATIONS You may resume your normal medications unless your doctor tells you otherwise.  WHAT YOU CAN EXPECT TODAY Some feelings of bloating in the abdomen.  Passage of more gas than usual.  Spotting of blood in your stool or on the toilet paper.  IF YOU HAD POLYPS REMOVED DURING THE COLONOSCOPY: No aspirin products for 7 days or as instructed.  No alcohol for 7 days or as instructed.  Eat a soft diet for the next 24 hours.  FINDING OUT THE RESULTS OF YOUR TEST Not all test results are available during your visit. If your test results are not back during the visit, make an appointment with your caregiver to find out the results. Do not assume everything is normal if you have not heard from your  caregiver or the medical facility. It is important for you to follow up on all of your test results.  SEEK IMMEDIATE MEDICAL ATTENTION IF: You have more than a spotting of blood in your stool.  Your belly is swollen (abdominal distention).  You are nauseated or vomiting.  You have a temperature over 101.  You have abdominal pain or discomfort that is severe or gets worse throughout the day.   Your colonoscopy revealed 1 polyp(s) which I removed successfully.  Previously noted scar found again in your rectum.  Looked healthy.  I took biopsies of this today.  Await pathology results, my office will contact you.  I will send in a prescription for Linzess 145 mcg daily.  Follow-up with GI in 3 months.  I hope you have a great rest of your week!  Hennie Duos. Marletta Lor, D.O. Gastroenterology and Hepatology [facility]

## 2023-06-29 NOTE — Op Note (Signed)
[facility] Patient Name: Milos Bevans Procedure Date: 06/29/2023 7:13 AM MRN: [id number] Date of Birth: Mar 23, 1961 Attending MD: Hennie Duos. Marletta Lor , [place], [id number] CSN: [id number] Age: 61 Admit Type: Outpatient Procedure:                Colonoscopy Indications:              High risk colon cancer surveillance: Personal                            history of colon cancer Providers:                Hennie Duos. Marletta Lor, DO, Crystal Page, Durwin Glaze Tech, Technician Referring MD:              Medicines:                See the Anesthesia note for documentation of the                            administered medications Complications:            No immediate complications. Estimated Blood Loss:     Estimated blood loss was minimal. Procedure:                Pre-Anesthesia Assessment:                           - The anesthesia plan was to use monitored                            anesthesia care (MAC).                           After obtaining informed consent, the colonoscope                            was passed under direct vision. Throughout the                            procedure, the patient's blood pressure, pulse, and                            oxygen saturations were monitored continuously. The                            [id number]-HQ190L ([id number]) scope was introduced through                            the anus and advanced to the the cecum, identified                            by appendiceal orifice and ileocecal valve. The                            patient tolerated the procedure well. The  quality                            of the bowel preparation was evaluated using the                            BBPS [facility] Bowel Preparation Scale) with scores                            of: Right Colon = 3, Transverse Colon = 3 and Left                            Colon = 3 (entire mucosa seen well with no residual                            staining, small  fragments of stool or opaque                            liquid). The total BBPS score equals 9. The                            colonoscopy was technically difficult and complex                            due to a redundant colon and significant looping.                            Successful completion of the procedure was aided by                            applying abdominal pressure. Scope In: 7:40:10 AM Scope Out: 7:57:52 AM Scope Withdrawal Time: 0 hours 13 minutes 24 seconds  Total Procedure Duration: 0 hours 17 minutes 42 seconds  Findings:      Non-bleeding internal hemorrhoids were found during endoscopy.      A post polypectomy scar was found in the rectum. The scar tissue was       healthy in appearance. Biopsies were taken with a cold forceps for       histology.      A tattoo was seen in the rectum.      A 7 mm polyp was found in the cecum. The polyp was sessile. The polyp       was removed with a cold snare. Resection and retrieval were complete. Impression:               - Non-bleeding internal hemorrhoids.                           - Post-polypectomy scar in the rectum. Biopsied.                           - A tattoo was seen in the rectum.                           - One 7 mm polyp in the cecum,  removed with a cold                            snare. Resected and retrieved. Moderate Sedation:      Per Anesthesia Care Recommendation:           - Patient has a contact number available for                            emergencies. The signs and symptoms of potential                            delayed complications were discussed with the                            patient. Return to normal activities tomorrow.                            Written discharge instructions were provided to the                            patient.                           - Resume previous diet.                           - Continue present medications.                           - Await pathology  results.                           - Repeat colonoscopy after studies are complete for                            surveillance based on pathology results.                           - Return to GI clinic in 3 months.                           - Decrease Linzess to 145 mcg daily per patient                            request Procedure Code(s):        --- Professional ---                           [phone], Colonoscopy, flexible; with removal of                            tumor(s), polyp(s), or other lesion(s) by snare                            technique  44010, 59, Colonoscopy, flexible; with biopsy,                            single or multiple Diagnosis Code(s):        --- Professional ---                           Z85.038, Personal history of other malignant                            neoplasm of large intestine                           Z98.890, Other specified postprocedural states                           K64.8, Other hemorrhoids                           D12.0, Benign neoplasm of cecum CPT copyright 2022 American Medical Association. All rights reserved. The codes documented in this report are preliminary and upon coder review may  be revised to meet current compliance requirements. Hennie Duos. Marletta Lor, DO Hennie Duos. Marletta Lor, DO 06/29/2023 8:02:21 AM This report has been signed electronically. Number of Addenda: 0

## 2023-06-29 NOTE — Anesthesia Postprocedure Evaluation (Signed)
Anesthesia Post Note  Patient: Shane Chambers  Procedure(s) Performed: COLONOSCOPY WITH PROPOFOL POLYPECTOMY INTESTINAL BIOPSY  Patient location during evaluation: Phase II Anesthesia Type: General Level of consciousness: awake Pain management: pain level controlled Vital Signs Assessment: post-procedure vital signs reviewed and stable Respiratory status: spontaneous breathing and respiratory function stable Cardiovascular status: blood pressure returned to baseline and stable Postop Assessment: no headache and no apparent nausea or vomiting Anesthetic complications: no Comments: Late entry   No notable events documented.   Last Vitals:  Vitals:   06/29/23 0729 06/29/23 0802  BP: (!) 166/67 (!) 115/52  Pulse: 73 61  Resp: 17 14  Temp: 36.6 C 36.9 C  SpO2: 97% 96%    Last Pain:  Vitals:   06/29/23 0802  TempSrc: Oral  PainSc: 0-No pain                 Windell Norfolk

## 2023-06-29 NOTE — Anesthesia Procedure Notes (Signed)
Date/Time: 06/29/2023 7:38 AM  Performed by: Julian Reil, CRNAPre-anesthesia Checklist: Patient identified, Emergency Drugs available, Suction available and Patient being monitored Patient Re-evaluated:Patient Re-evaluated prior to induction Oxygen Delivery Method: Nasal cannula Induction Type: IV induction Placement Confirmation: positive ETCO2 Comments: Optiflow High Flow [place] O2 used.

## 2023-06-30 LAB — SURGICAL PATHOLOGY

## 2023-07-01 ENCOUNTER — Encounter (HOSPITAL_COMMUNITY): Payer: Self-pay | Admitting: Internal Medicine

## 2023-07-23 ENCOUNTER — Encounter: Payer: Self-pay | Admitting: *Deleted

## 2023-07-23 ENCOUNTER — Ambulatory Visit: Payer: Medicare Other | Attending: Student | Admitting: Student

## 2023-07-23 ENCOUNTER — Encounter: Payer: Self-pay | Admitting: Student

## 2023-07-23 VITALS — BP 130/62 | HR 70 | Ht 70.0 in

## 2023-07-23 DIAGNOSIS — I5032 Chronic diastolic (congestive) heart failure: Secondary | ICD-10-CM | POA: Diagnosis present

## 2023-07-23 DIAGNOSIS — I350 Nonrheumatic aortic (valve) stenosis: Secondary | ICD-10-CM | POA: Diagnosis present

## 2023-07-23 DIAGNOSIS — I10 Essential (primary) hypertension: Secondary | ICD-10-CM

## 2023-07-23 DIAGNOSIS — I2510 Atherosclerotic heart disease of native coronary artery without angina pectoris: Secondary | ICD-10-CM

## 2023-07-23 DIAGNOSIS — N1831 Chronic kidney disease, stage 3a: Secondary | ICD-10-CM

## 2023-07-23 DIAGNOSIS — E785 Hyperlipidemia, unspecified: Secondary | ICD-10-CM

## 2023-07-23 NOTE — Progress Notes (Signed)
Cardiology Office Note    Date:  07/23/2023  ID:  DEONTA STIGEN, DOB 08-16-1961, MRN [id number] Cardiologist: Dina Rich, MD    History of Present Illness:    Shane Chambers is a 62 y.o. 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  He was last examined by myself in 03/2023 following a recent hospitalization at [facility] for a CHF exacerbation and AKI. At the time of his follow-up visit, his biggest concern at that time was irritation along his Foley catheter site and he did have follow-up with Urology scheduled. He denied any specific respiratory issues at that time and his weight had overall been stable since hospital discharge. He was continued on Torsemide 20 mg daily and was not on an SGLT2 inhibitor given his wheelchair/bedbound status and current Foley catheter placement. He did meet with Dr. Ladona Ridgel in 05/2023 for follow-up of his prolonged QT and no medication changes were made at that time.  In talking with the patient today, he reports things have been stable since his last visit.  Reports still having issues with his Foley catheter and was actually evaluated at [facility] ED in [place] a few weeks ago for a UTI and hematuria. Reports repeat blood work was obtained at that time. Says that his weight has increased but he is unsure about the exact amount. Feels like he is consuming more food and is more sedentary. Denies any associated respiratory issues. No specific dyspnea on exertion, orthopnea, PND or pitting edema. Reports occasional palpitations but no persistent symptoms or chest pain. He is  mostly wheelchair bound and uses a Nurse, adult for transfers.   Studies Reviewed:   EKG: EKG is not ordered today.  Cardiac Catheterization: 02/2020 Prox RCA lesion is 99% stenosed. Prox RCA to Mid RCA lesion is 40% stenosed. Dist RCA lesion is 30% stenosed. Prox LAD to Mid LAD lesion is 80% stenosed. 1st Diag lesion is 60% stenosed. Mid LM to Dist LM lesion is 40% stenosed. Prox Cx to Mid Cx lesion is 95% stenosed.   1. 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 Moderately elevated LV/right heart filling pressures c/w known diagnosis of heart failure 3. Known severe LV dysfunction by echo   Recommend: review revascularization options. Pt has surgical anatomy, but not sure he will be a candidate for CABG. High-risk multivessel PCI could be considered if he is not a candidate for surgery.  Limited Echocardiogram: 02/2023 IMPRESSIONS     1. Left ventricular ejection fraction, by estimation, is 60 to 65%. The  left ventricle has normal function. Left ventricular endocardial border  not optimally defined to evaluate regional wall motion. There is moderate  left ventricular hypertrophy.   2. Right ventricular systolic function is normal. The right ventricular  size is normal.   3. The aortic valve has an indeterminant number of cusps. There is mild  calcification of the aortic valve. There is mild thickening of the aortic  valve. Aortic valve regurgitation is not visualized. Moderate aortic  valve  stenosis. Aortic valve mean  gradient measures 20.7 mmHg. Aortic valve peak gradient measures 35.3  mmHg. Aortic valve area, by VTI measures 1.17 cm.   Physical Exam:   VS:  BP 130/62   Pulse 70   Ht 5\' 10"  (1.778 m)   SpO2 96%   BMI 30.85 kg/m    Wt Readings from Last 3 Encounters:  06/29/23 215 lb (97.5 kg)  06/04/23 214 lb (97.1 kg)  05/28/23 213 lb  6.4 oz (96.8 kg)     GEN: Pleasant male appearing in no acute distress. Sitting in wheelchair.  NECK: No JVD; No carotid bruits CARDIAC: RRR, 2/6 SEM along right upper sternal border. RESPIRATORY:  Clear to auscultation without rales, wheezing or rhonchi  ABDOMEN: Appears non-distended. No obvious abdominal masses. EXTREMITIES: No clubbing or cyanosis. No pitting edema.  Right BKA.    Assessment and Plan:   1. HFimpEF - His ejection fraction was previously as low as 20 to 25% in 2021 but had normalized by repeat imaging later that year. Most recent echocardiogram in 02/2023 showed his EF remains preserved at 60 to 65%. - He reports his weight has increased but is unsure of the exact amount and is unable to stand on the scale today as he requires a lift for transfers. I suspect his weight gain is mostly secondary to caloric intake and being sedentary as he does not appear volume overloaded on examination today. I will ask his SNF to obtain weights at least 1-2 times weekly as able. If weight acutely changes, he may require adjustment of diuretic therapy. For now, continue Torsemide 20 mg daily and we will request a copy of his recent labs.  Would continue to avoid the use of an SGLT2 inhibitor given his Foley catheter placement and increased infection risk.   2. CAD - He did undergo CABG in 03/2020 with details as outlined above. Activity is limited as he is bed-bound/wheelchair-bound but denies any anginal symptoms.  - Continue current medical therapy with ASA 81 mg daily, Atorvastatin 40 mg daily and Coreg 25 mg twice daily.  3. Aortic Stenosis - He did have moderate AS by echocardiogram in 02/2023. Would plan for repeat imaging next year around 02/2024.  4. HLD - LDL was at 61 when checked in 03/2023. Continue current medical therapy with Atorvastatin 40 mg daily.  5. HTN - BP is well-controlled at 130/62 during today's visit. Continue current medical therapy with Amlodipine 10 mg  daily, Coreg 25 mg twice daily and Hydralazine 100 mg 3 times daily.  6. Stage 3 CKD - Creatinine was elevated to 1.96 when checked in 05/2023. He did have repeat labs a few weeks ago and we will request a copy of these.   Signed, Ellsworth Lennox, PA-C

## 2023-07-23 NOTE — Patient Instructions (Signed)
Medication Instructions:  Your physician recommends that you continue on your current medications as directed. Please refer to the Current Medication list given to you today.  *If you need a refill on your cardiac medications before your next appointment, please call your pharmacy*   Lab Work: NONE   If you have labs (blood work) drawn today and your tests are completely normal, you will receive your results only by: MyChart Message (if you have MyChart) OR A paper copy in the mail If you have any lab test that is abnormal or we need to change your treatment, we will call you to review the results.   Testing/Procedures: NONE    Follow-Up: At [facility], you and your health needs are our priority.  As part of our continuing mission to provide you with exceptional heart care, we have created designated Provider Care Teams.  These Care Teams include your primary Cardiologist (physician) and Advanced Practice Providers (APPs -  Physician Assistants and Nurse Practitioners) who all work together to provide you with the care you need, when you need it.  We recommend signing up for the patient portal called "MyChart".  Sign up information is provided on this After Visit Summary.  MyChart is used to connect with patients for Virtual Visits (Telemedicine).  Patients are able to view lab/test results, encounter notes, upcoming appointments, etc.  Non-urgent messages can be sent to your provider as well.   To learn more about what you can do with MyChart, go to [email or web address].    Your next appointment:   6 month(s)  Provider:   Jonathan Branch, MD    Other Instructions Thank you for choosing [facility]!    

## 2023-07-24 ENCOUNTER — Encounter: Payer: Self-pay | Admitting: *Deleted

## 2023-07-30 ENCOUNTER — Emergency Department: Payer: Medicare Other

## 2023-07-30 ENCOUNTER — Other Ambulatory Visit: Payer: Self-pay

## 2023-07-30 ENCOUNTER — Inpatient Hospital Stay
Admission: EM | Admit: 2023-07-30 | Discharge: 2023-08-03 | DRG: 392 | Disposition: A | Discharge status: Skilled Nursing Facility | Payer: Medicare Other | Source: Skilled Nursing Facility | Attending: Internal Medicine | Admitting: Internal Medicine

## 2023-07-30 DIAGNOSIS — K5289 Other specified noninfective gastroenteritis and colitis: Secondary | ICD-10-CM | POA: Diagnosis not present

## 2023-07-30 DIAGNOSIS — I10 Essential (primary) hypertension: Secondary | ICD-10-CM | POA: Diagnosis present

## 2023-07-30 DIAGNOSIS — I2510 Atherosclerotic heart disease of native coronary artery without angina pectoris: Secondary | ICD-10-CM | POA: Diagnosis present

## 2023-07-30 DIAGNOSIS — K219 Gastro-esophageal reflux disease without esophagitis: Secondary | ICD-10-CM | POA: Diagnosis present

## 2023-07-30 DIAGNOSIS — Z91013 Allergy to seafood: Secondary | ICD-10-CM

## 2023-07-30 DIAGNOSIS — E1151 Type 2 diabetes mellitus with diabetic peripheral angiopathy without gangrene: Secondary | ICD-10-CM | POA: Diagnosis present

## 2023-07-30 DIAGNOSIS — Z8249 Family history of ischemic heart disease and other diseases of the circulatory system: Secondary | ICD-10-CM

## 2023-07-30 DIAGNOSIS — I5032 Chronic diastolic (congestive) heart failure: Secondary | ICD-10-CM | POA: Diagnosis present

## 2023-07-30 DIAGNOSIS — Z89511 Acquired absence of right leg below knee: Secondary | ICD-10-CM

## 2023-07-30 DIAGNOSIS — E1129 Type 2 diabetes mellitus with other diabetic kidney complication: Secondary | ICD-10-CM | POA: Diagnosis present

## 2023-07-30 DIAGNOSIS — Z7982 Long term (current) use of aspirin: Secondary | ICD-10-CM

## 2023-07-30 DIAGNOSIS — J449 Chronic obstructive pulmonary disease, unspecified: Secondary | ICD-10-CM | POA: Diagnosis present

## 2023-07-30 DIAGNOSIS — Z794 Long term (current) use of insulin: Secondary | ICD-10-CM

## 2023-07-30 DIAGNOSIS — E785 Hyperlipidemia, unspecified: Secondary | ICD-10-CM | POA: Diagnosis present

## 2023-07-30 DIAGNOSIS — E1121 Type 2 diabetes mellitus with diabetic nephropathy: Secondary | ICD-10-CM

## 2023-07-30 DIAGNOSIS — Z951 Presence of aortocoronary bypass graft: Secondary | ICD-10-CM

## 2023-07-30 DIAGNOSIS — Z91048 Other nonmedicinal substance allergy status: Secondary | ICD-10-CM

## 2023-07-30 DIAGNOSIS — G4730 Sleep apnea, unspecified: Secondary | ICD-10-CM | POA: Diagnosis present

## 2023-07-30 DIAGNOSIS — I130 Hypertensive heart and chronic kidney disease with heart failure and stage 1 through stage 4 chronic kidney disease, or unspecified chronic kidney disease: Secondary | ICD-10-CM | POA: Diagnosis present

## 2023-07-30 DIAGNOSIS — R1084 Generalized abdominal pain: Secondary | ICD-10-CM

## 2023-07-30 DIAGNOSIS — Z79899 Other long term (current) drug therapy: Secondary | ICD-10-CM

## 2023-07-30 DIAGNOSIS — E669 Obesity, unspecified: Secondary | ICD-10-CM | POA: Diagnosis present

## 2023-07-30 DIAGNOSIS — K5900 Constipation, unspecified: Secondary | ICD-10-CM | POA: Diagnosis present

## 2023-07-30 DIAGNOSIS — Z85048 Personal history of other malignant neoplasm of rectum, rectosigmoid junction, and anus: Secondary | ICD-10-CM

## 2023-07-30 DIAGNOSIS — I252 Old myocardial infarction: Secondary | ICD-10-CM

## 2023-07-30 DIAGNOSIS — N1831 Chronic kidney disease, stage 3a: Secondary | ICD-10-CM | POA: Diagnosis present

## 2023-07-30 DIAGNOSIS — Z7951 Long term (current) use of inhaled steroids: Secondary | ICD-10-CM

## 2023-07-30 DIAGNOSIS — E039 Hypothyroidism, unspecified: Secondary | ICD-10-CM | POA: Diagnosis present

## 2023-07-30 DIAGNOSIS — E1122 Type 2 diabetes mellitus with diabetic chronic kidney disease: Secondary | ICD-10-CM | POA: Diagnosis present

## 2023-07-30 DIAGNOSIS — Z79890 Hormone replacement therapy (postmenopausal): Secondary | ICD-10-CM

## 2023-07-30 DIAGNOSIS — D509 Iron deficiency anemia, unspecified: Secondary | ICD-10-CM | POA: Diagnosis present

## 2023-07-30 DIAGNOSIS — Z87891 Personal history of nicotine dependence: Secondary | ICD-10-CM

## 2023-07-30 DIAGNOSIS — N400 Enlarged prostate without lower urinary tract symptoms: Secondary | ICD-10-CM | POA: Diagnosis present

## 2023-07-30 DIAGNOSIS — Z880 Allergy status to penicillin: Secondary | ICD-10-CM

## 2023-07-30 DIAGNOSIS — N179 Acute kidney failure, unspecified: Secondary | ICD-10-CM | POA: Diagnosis present

## 2023-07-30 DIAGNOSIS — I255 Ischemic cardiomyopathy: Secondary | ICD-10-CM | POA: Diagnosis present

## 2023-07-30 DIAGNOSIS — N184 Chronic kidney disease, stage 4 (severe): Secondary | ICD-10-CM | POA: Diagnosis present

## 2023-07-30 DIAGNOSIS — Z888 Allergy status to other drugs, medicaments and biological substances status: Secondary | ICD-10-CM

## 2023-07-30 LAB — CBC
HCT: 29.6 % — ABNORMAL LOW (ref 39.0–52.0)
Hemoglobin: 9.4 g/dL — ABNORMAL LOW (ref 13.0–17.0)
MCH: 29.9 pg (ref 26.0–34.0)
MCHC: 31.8 g/dL (ref 30.0–36.0)
MCV: 94.3 fL (ref 80.0–100.0)
Platelets: 254 10*3/uL (ref 150–400)
RBC: 3.14 MIL/uL — ABNORMAL LOW (ref 4.22–5.81)
RDW: 14.3 % (ref 11.5–15.5)
WBC: 11.7 10*3/uL — ABNORMAL HIGH (ref 4.0–10.5)
nRBC: 0 % (ref 0.0–0.2)

## 2023-07-30 LAB — URINALYSIS, ROUTINE W REFLEX MICROSCOPIC
Bilirubin Urine: NEGATIVE
Glucose, UA: NEGATIVE mg/dL
Hgb urine dipstick: NEGATIVE
Ketones, ur: NEGATIVE mg/dL
Leukocytes,Ua: NEGATIVE
Nitrite: NEGATIVE
Protein, ur: 100 mg/dL — AB
Specific Gravity, Urine: 1.011 (ref 1.005–1.030)
pH: 5 (ref 5.0–8.0)

## 2023-07-30 LAB — COMPREHENSIVE METABOLIC PANEL
ALT: 12 U/L (ref 0–44)
AST: 13 U/L — ABNORMAL LOW (ref 15–41)
Albumin: 3.3 g/dL — ABNORMAL LOW (ref 3.5–5.0)
Alkaline Phosphatase: 98 U/L (ref 38–126)
Anion gap: 9 (ref 5–15)
BUN: 32 mg/dL — ABNORMAL HIGH (ref 6–20)
CO2: 25 mmol/L (ref 22–32)
Calcium: 8.6 mg/dL — ABNORMAL LOW (ref 8.9–10.3)
Chloride: 108 mmol/L (ref 98–111)
Creatinine, Ser: 1.68 mg/dL — ABNORMAL HIGH (ref 0.61–1.24)
GFR, Estimated: 46 mL/min — ABNORMAL LOW (ref >60)
Glucose, Bld: 119 mg/dL — ABNORMAL HIGH (ref 70–99)
Potassium: 3.7 mmol/L (ref 3.5–5.1)
Sodium: 142 mmol/L (ref 135–145)
Total Bilirubin: 0.7 mg/dL (ref 0.3–1.2)
Total Protein: 6.8 g/dL (ref 6.5–8.1)

## 2023-07-30 LAB — CBG MONITORING, ED: Glucose-Capillary: 123 mg/dL — ABNORMAL HIGH (ref 70–99)

## 2023-07-30 LAB — BRAIN NATRIURETIC PEPTIDE: B Natriuretic Peptide: 320.2 pg/mL — ABNORMAL HIGH (ref 0.0–100.0)

## 2023-07-30 LAB — LIPASE, BLOOD: Lipase: 46 U/L (ref 11–51)

## 2023-07-30 MED ORDER — ALBUTEROL SULFATE (2.5 MG/3ML) 0.083% IN NEBU: 3.0000 mL | INHALATION_SOLUTION | RESPIRATORY_TRACT | Status: DC | PRN

## 2023-07-30 MED ORDER — ONDANSETRON HCL 4 MG/2ML IJ SOLN
4.0000 mg | Freq: Once | INTRAMUSCULAR | Status: AC
  Administered 2023-07-30: 4 mg via INTRAVENOUS
  Filled 2023-07-30: qty 2

## 2023-07-30 MED ORDER — MORPHINE SULFATE (PF) 4 MG/ML IV SOLN
4.0000 mg | Freq: Once | INTRAVENOUS | Status: AC
  Administered 2023-07-30: 4 mg via INTRAVENOUS
  Filled 2023-07-30: qty 1

## 2023-07-30 MED ORDER — LACTATED RINGERS IV BOLUS
1000.0000 mL | Freq: Once | INTRAVENOUS | Status: AC
  Administered 2023-07-30: 1000 mL via INTRAVENOUS

## 2023-07-30 MED ORDER — CIPROFLOXACIN IN D5W 400 MG/200ML IV SOLN
400.0000 mg | Freq: Once | INTRAVENOUS | Status: AC
  Administered 2023-07-30: 400 mg via INTRAVENOUS
  Filled 2023-07-30: qty 200

## 2023-07-30 MED ORDER — POLYETHYLENE GLYCOL 3350 17 G PO PACK
17.0000 g | PACK | Freq: Every day | ORAL | Status: DC
  Administered 2023-07-30 – 2023-08-03 (×5): 17 g via ORAL
  Filled 2023-07-30 (×5): qty 1

## 2023-07-30 MED ORDER — METRONIDAZOLE 500 MG/100ML IV SOLN
500.0000 mg | Freq: Once | INTRAVENOUS | Status: AC
  Administered 2023-07-30: 500 mg via INTRAVENOUS
  Filled 2023-07-30: qty 100

## 2023-07-30 MED ORDER — MORPHINE SULFATE (PF) 2 MG/ML IV SOLN
2.0000 mg | INTRAVENOUS | Status: DC | PRN
  Administered 2023-07-31 – 2023-08-02 (×3): 2 mg via INTRAVENOUS
  Filled 2023-07-30 (×3): qty 1

## 2023-07-30 MED ORDER — SENNOSIDES-DOCUSATE SODIUM 8.6-50 MG PO TABS
1.0000 | ORAL_TABLET | Freq: Two times a day (BID) | ORAL | Status: DC
  Administered 2023-07-30 – 2023-08-03 (×7): 1 via ORAL
  Filled 2023-07-30 (×7): qty 1

## 2023-07-30 MED ORDER — CIPROFLOXACIN IN D5W 400 MG/200ML IV SOLN
400.0000 mg | Freq: Two times a day (BID) | INTRAVENOUS | Status: DC
  Administered 2023-07-31 – 2023-08-01 (×4): 400 mg via INTRAVENOUS
  Filled 2023-07-30 (×5): qty 200

## 2023-07-30 MED ORDER — IOHEXOL 300 MG/ML  SOLN
100.0000 mL | Freq: Once | INTRAMUSCULAR | Status: AC | PRN
  Administered 2023-07-30: 100 mL via INTRAVENOUS

## 2023-07-30 MED ORDER — INSULIN ASPART 100 UNIT/ML IJ SOLN: 0.0000 [IU] | Freq: Every day | INTRAMUSCULAR | Status: DC

## 2023-07-30 MED ORDER — INSULIN ASPART 100 UNIT/ML IJ SOLN
0.0000 [IU] | Freq: Three times a day (TID) | INTRAMUSCULAR | Status: DC
  Administered 2023-07-31 (×2): 2 [IU] via SUBCUTANEOUS
  Administered 2023-08-02 – 2023-08-03 (×2): 1 [IU] via SUBCUTANEOUS
  Filled 2023-07-30 (×3): qty 1

## 2023-07-30 MED ORDER — ENOXAPARIN SODIUM 40 MG/0.4ML IJ SOSY
40.0000 mg | PREFILLED_SYRINGE | INTRAMUSCULAR | Status: DC
  Administered 2023-07-30 – 2023-08-02 (×4): 40 mg via SUBCUTANEOUS
  Filled 2023-07-30 (×4): qty 0.4

## 2023-07-30 MED ORDER — HYDRALAZINE HCL 20 MG/ML IJ SOLN: 5.0000 mg | INTRAMUSCULAR | Status: DC | PRN

## 2023-07-30 MED ORDER — ONDANSETRON HCL 4 MG/2ML IJ SOLN
4.0000 mg | Freq: Three times a day (TID) | INTRAMUSCULAR | Status: DC | PRN
  Administered 2023-07-31: 4 mg via INTRAVENOUS
  Filled 2023-07-30: qty 2

## 2023-07-30 MED ORDER — ACETAMINOPHEN 325 MG PO TABS: 650.0000 mg | ORAL_TABLET | Freq: Four times a day (QID) | ORAL | Status: DC | PRN

## 2023-07-30 MED ORDER — METRONIDAZOLE 500 MG/100ML IV SOLN
500.0000 mg | Freq: Two times a day (BID) | INTRAVENOUS | Status: DC
  Administered 2023-07-31 – 2023-08-01 (×4): 500 mg via INTRAVENOUS
  Filled 2023-07-30 (×5): qty 100

## 2023-07-30 MED ORDER — OXYCODONE-ACETAMINOPHEN 5-325 MG PO TABS
1.0000 | ORAL_TABLET | ORAL | Status: DC | PRN
  Administered 2023-07-31 – 2023-08-02 (×5): 1 via ORAL
  Filled 2023-07-30 (×5): qty 1

## 2023-07-30 MED ORDER — DM-GUAIFENESIN ER 30-600 MG PO TB12: 1.0000 | ORAL_TABLET | Freq: Two times a day (BID) | ORAL | Status: DC | PRN

## 2023-07-30 NOTE — ED Triage Notes (Addendum)
Pt comes via [facility] from the [facility] with belly pain. Pt states no good BM for about week. Pt  states lower pain all over. Pt states more distended than usual but not per EMS.   VSS  Pt is amputation right below knee and has foley in place.

## 2023-07-30 NOTE — ED Notes (Signed)
Pt had loose stool accident. Pt cleaned up with EDT [place] and [place]. Pt placed in clean brief and chux placed. Pt placed in stretcher and given warm blankets. Pt repositioned and resting comfortably now. Pt denies any needs.

## 2023-07-30 NOTE — H&P (Signed)
History and Physical    Shane Chambers DOB: [birth year] DOA: 07/30/2023  Referring MD/NP/PA:   PCP: Shane Pander, MD   Patient coming from:  The patient is coming from SNF    Chief Complaint: Abdominal pain  HPI: Shane Chambers is a 62 y.o. male with medical history significant of hypertension, hyperlipidemia, diabetes mellitus, COPD, CAD, diastolic CHF, hypothyroidism, depression, BPH, CKD-3A, s/p of right BKA, rectal cancer 2022 (s/p of surgery, no chemo or radiation therapy), who presents with abdominal pain  Patient states that he has abdominal pain for more than 1 week.  His abdominal pain is constant, diffuse, aching, moderate, nonradiating.  Associated with abdominal distention.  Not aggravated or alleviated by any known factors.  Patient has been constipated.  He has nausea, no vomiting, diarrhea or abdominal pain.  No fever or chills.  Denies chest pain, cough, shortness of breath.  No symptoms of UTI.  Data reviewed independently and ED Course: pt was found to have WBC 11.7, stable renal function, temperature normal, blood pressure 140/62, heart rate 62, RR 27, oxygen saturation 96% on room air.  Patient is placed on MedSurg bed for observation.   EKG: I have personally reviewed.  Sinus rhythm, QTc 477, poor R wave progression, Q waves in inferior leads.   Review of Systems:   General: no fevers, chills, no body weight gain, has poor appetite, has fatigue HEENT: no blurry vision, hearing changes or sore throat Respiratory: no dyspnea, coughing, wheezing CV: no chest pain, no palpitations GI: Has nausea, abdominal pain, abdominal distention, constipation. GU: no dysuria, burning on urination, increased urinary frequency, hematuria  Ext: no leg edema Neuro: no unilateral weakness, numbness, or tingling, no vision change or hearing loss Skin: no rash, no skin tear. MSK: No muscle spasm, no deformity, no limitation of range of movement in spin Heme: No easy  bruising.  Travel history: No recent long distant travel.   Allergy:  Allergies  Allergen Reactions   Ace Inhibitors Swelling and Cough    (Not on MAR at [name])   Haloperidol And Related     Do NOT give anti-psychotics due to risk of  Torsades and QT prolongation (per Dr. Teena Irani request)   Other Itching    [name]   Shellfish Allergy     Listed on MAR   Penicillins Itching and Rash    Has patient had a PCN reaction causing immediate rash, facial/tongue/throat swelling, SOB or lightheadedness with hypotension: Unknown Has patient had a PCN reaction causing severe rash involving mucus membranes or skin necrosis: Unknown Has patient had a PCN reaction that required hospitalization: Unknown Has patient had a PCN reaction occurring within the last 10 years: Unknown If all of the above answers are "NO", then may proceed with Cephalosporin use.  Tolerated ancef (12-17-22)     Past Medical History:  Diagnosis Date   Anemia    Arthritis    CAD (coronary artery disease)    a. s/p CABG in 03/2020 with LIMA-LAD, RIMA-PL, [id number]-OM1   Cancer (HCC)    rectal   Cellulitis    COPD (chronic obstructive pulmonary disease) (HCC)    Depression    Diabetes mellitus without complication (HCC)    GERD (gastroesophageal reflux disease)    History of kidney stones    Hyperlipidemia 12/01/2019   Hypertension    Hypothyroidism    Ischemic cardiomyopathy    a. EF 20-25% by echo in 02/2020 b. at 40%  by echo in 03/2020 c. EF normalized to 60-65% by echo in 04/2020   Myocardial infarction [facility])    Peripheral vascular disease (HCC)    Sleep apnea    Type 2 diabetes mellitus [facility])     Past Surgical History:  Procedure Laterality Date   AMPUTATION Right 10/05/2022   Procedure: AMPUTATION BELOW KNEE;  Surgeon: Nadara Mustard, MD;  Location: [facility] OR;  Service: Orthopedics;  Laterality: Right;   AMPUTATION Right 11/14/2022   Procedure: AMPUTATION BELOW KNEE  REVISION; WASHOUT;  Surgeon: Renford Dills, MD;  Location: [facility] ORS;  Service: Vascular;  Laterality: Right;   AMPUTATION Right 11/18/2022   Procedure: AMPUTATION BELOW KNEE REVISION AND CLOSURE;  Surgeon: Renford Dills, MD;  Location: [facility] ORS;  Service: Vascular;  Laterality: Right;   APPLICATION OF WOUND VAC Right 10/05/2022   Procedure: APPLICATION OF WOUND VAC;  Surgeon: Nadara Mustard, MD;  Location: [facility] OR;  Service: Orthopedics;  Laterality: Right;   BIOPSY  07/18/2021   Procedure: BIOPSY;  Surgeon: Lanelle Bal, DO;  [facility];  Service: Endoscopy;;   BIOPSY  01/09/2022   Procedure: BIOPSY;  Surgeon: Lanelle Bal, DO;  [facility];  Service: Endoscopy;;   BIOPSY  06/29/2023   Procedure: BIOPSY;  Surgeon: Lanelle Bal, DO;  [facility];  Service: Endoscopy;;   COLONOSCOPY WITH PROPOFOL N/A 07/18/2021   Carver: 15 millimeter polyp removed from the sigmoid colon, 5 mm polyp removed from sigmoid colon.  Nonbleeding internal hemorrhoids.  Significant looping of the colon. sigmoid path showed invasive colonic adenocarcinoma involving tubular adenoma (invades to depth of 2mm, carcinoma 1mm from margin, no lymphovascular invasion, no poorly differentiated component.   COLONOSCOPY WITH PROPOFOL N/A 06/29/2023   Procedure: COLONOSCOPY WITH PROPOFOL;  Surgeon: Lanelle Bal, DO;  [facility];  Service: Endoscopy;  Laterality: N/A;  7:30 am, asa 3   CORONARY ARTERY BYPASS GRAFT N/A 03/13/2020   Procedure: CORONARY ARTERY BYPASS GRAFTING (CABG) times five using bilateral Internal mammary arteries and left radial artery;  Surgeon: Linden Dolin, MD;  Location: [facility] OR;  Service: Open Heart Surgery;  Laterality: N/A;   DENTAL SURGERY     ESOPHAGOGASTRODUODENOSCOPY (EGD) WITH PROPOFOL N/A 07/18/2021   Carver: 1 gastric polyp status post biopsy, gastritis. gastric bx with slight chronic inflammation and no H.pyori. GEJ polypectomy  with mild inflammation only   FLEXIBLE SIGMOIDOSCOPY N/A 08/26/2021   Carver: Nonbleeding internal hemorrhoids.  15 mm ulcers from previous polypectomy found in the rectum.  No evidence of previous polyp.  Located 5 to 8 cm from anal verge.   FLEXIBLE SIGMOIDOSCOPY N/A 01/09/2022   Procedure: FLEXIBLE SIGMOIDOSCOPY;  Surgeon: Lanelle Bal, DO;  [facility];  Service: Endoscopy;  Laterality: N/A;   POLYPECTOMY  07/18/2021   Procedure: POLYPECTOMY INTESTINAL;  Surgeon: Lanelle Bal, DO;  [facility];  Service: Endoscopy;;   POLYPECTOMY  06/29/2023   Procedure: POLYPECTOMY INTESTINAL;  Surgeon: Lanelle Bal, DO;  [facility];  Service: Endoscopy;;   RADIAL ARTERY HARVEST Left 03/13/2020   Procedure: RADIAL ARTERY HARVEST,;  Surgeon: Linden Dolin, MD;  Location: [facility] OR;  Service: Open Heart Surgery;  Laterality: Left;   RIGHT/LEFT HEART CATH AND CORONARY ANGIOGRAPHY N/A 03/07/2020   Procedure: RIGHT/LEFT HEART CATH AND CORONARY ANGIOGRAPHY;  Surgeon: Tonny Bollman, MD;  Location: [facility] INVASIVE CV LAB;  Service: Cardiovascular;  Laterality: N/A;   STUMP  REVISION Right 12/17/2022   Procedure: RIGHT BELOW KNEE AMPUTATION REVISION;  Surgeon: Nadara Mustard, MD;  Location: [facility] OR;  Service: Orthopedics;  Laterality: Right;   SUBMUCOSAL LIFTING INJECTION  01/09/2022   Procedure: SUBMUCOSAL LIFTING INJECTION;  Surgeon: Lanelle Bal, DO;  [facility];  Service: Endoscopy;;   SUBMUCOSAL TATTOO INJECTION  01/09/2022   Procedure: SUBMUCOSAL TATTOO INJECTION;  Surgeon: Lanelle Bal, DO;  [facility];  Service: Endoscopy;;   TEE WITHOUT CARDIOVERSION N/A 03/13/2020   Procedure: TRANSESOPHAGEAL ECHOCARDIOGRAM (TEE);  Surgeon: Linden Dolin, MD;  Location: [facility] OR;  Service: Open Heart Surgery;  Laterality: N/A;    Social History:  reports that he quit smoking about 28 years ago. His smoking use included cigarettes. He has never  used smokeless tobacco. He reports that he does not currently use alcohol. He reports that he does not use drugs.  Family History:  Family History  Problem Relation Age of Onset   Hypertension Mother      Prior to Admission medications   Medication Sig Start Date End Date Taking? Authorizing Provider  acetaminophen (TYLENOL) 500 MG tablet Take 1,000 mg by mouth every 8 (eight) hours as needed for fever or moderate pain.    [provider]  albuterol (VENTOLIN HFA) 108 (90 Base) MCG/ACT inhaler Inhale 2 puffs into the lungs every 6 (six) hours as needed for wheezing or shortness of breath. 05/02/22   Johnson, Clanford L, MD  amLODipine (NORVASC) 10 MG tablet Take 10 mg by mouth daily. 06/28/23   [provider]  aspirin 81 MG EC tablet Take 1 tablet (81 mg total) by mouth daily with breakfast. 11/29/20   Shon Hale, MD  atorvastatin (LIPITOR) 40 MG tablet Take 1 tablet (40 mg total) by mouth at bedtime. 11/28/22   Gillis Santa, MD  AUVELITY 45-105 MG TBCR Take by mouth. 07/22/23   [provider]  bisacodyl 5 MG EC tablet Take 1 tablet (5 mg total) by mouth daily as needed for moderate constipation. 05/28/23   Lanelle Bal, DO  budesonide-formoterol [facility]) 160-4.5 MCG/ACT inhaler Inhale 2 puffs into the lungs in the morning and at bedtime. 05/24/20   Oretha Milch, MD  carvedilol (COREG) 25 MG tablet Take 1 tablet (25 mg total) by mouth 2 (two) times daily with a meal. 04/02/23   Meredeth Ide, MD  ferrous gluconate (FERGON) 324 MG tablet Take 324 mg by mouth daily.    [provider]  gabapentin (NEURONTIN) 300 MG capsule Take 1 capsule (300 mg total) by mouth at bedtime. 02/19/23   Vassie Loll, MD  Glucerna [facility]) LIQD Take 1 Can by mouth with breakfast, with lunch, and with evening meal. Patient not taking: Reported on 07/23/2023    [provider]  hydrALAZINE (APRESOLINE) 100 MG tablet Take 1 tablet (100 mg total) by mouth every 8  (eight) hours. 04/02/23   Meredeth Ide, MD  insulin aspart (NOVOLOG FLEXPEN) 100 UNIT/ML FlexPen 0-15 Units, Subcutaneous, 3 times daily with meals CBG < 70: implement hypoglycemia protocol-call MD CBG 70 - 120: 0 units CBG 121 - 150: 2 units CBG 151 - 200: 3 units CBG 201 - 250: 5 units CBG 251 - 300: 8 units CBG 301 - 350: 11 units CBG 351 - 400: 15 units CBG > 400: 10/17/22   Ghimire, Werner Lean, MD  ipratropium-albuterol (DUONEB) 0.5-2.5 (3) MG/3ML SOLN Take 3 mLs by nebulization every 6 (six) hours as needed.  Patient taking differently: Take 3 mLs by nebulization every 6 (six) hours as needed (SOB/wheezing). 10/17/22   Ghimire, Werner Lean, MD  levothyroxine (SYNTHROID) 75 MCG tablet Take 75 mcg by mouth daily before breakfast.    [provider]  linaclotide Karlene Einstein) 145 MCG CAPS capsule Take 1 capsule (145 mcg total) by mouth daily before breakfast. 06/29/23 06/28/24  Lanelle Bal, DO  mirtazapine (REMERON) 30 MG tablet Take 30 mg by mouth at bedtime.    [provider]  Multiple Vitamin (MULTIVITAMIN) tablet Take 1 tablet by mouth daily. Patient not taking: Reported on 07/23/2023    [provider]  omeprazole (PRILOSEC) 40 MG capsule Take 40 mg by mouth in the morning. 01/01/22   [provider]  polyethylene glycol (MIRALAX / GLYCOLAX) 17 g packet Take 17 g by mouth daily as needed for moderate constipation.    [provider]  polyvinyl alcohol (LIQUIFILM TEARS) 1.4 % ophthalmic solution Place 1 drop into both eyes daily.    [provider]  tamsulosin (FLOMAX) 0.4 MG CAPS capsule Take 0.8 mg by mouth every evening.    [provider]  thiamine (VITAMIN B-1) 100 MG tablet Take 1 tablet (100 mg total) by mouth daily. 10/18/22   Ghimire, Werner Lean, MD  torsemide (DEMADEX) 20 MG tablet Take 1 tablet (20 mg total) by mouth daily. 04/03/23   Meredeth Ide, MD  venlafaxine XR (EFFEXOR-XR) 75 MG 24 hr capsule Take 225 mg by mouth  daily. Patient not taking: Reported on 07/23/2023 03/11/23   [provider]  Vitamin D, Ergocalciferol, (DRISDOL) 1.25 MG (50000 UNIT) CAPS capsule Take 50,000 Units by mouth every 7 (seven) days. (Mondays)    [provider]    Physical Exam: Vitals:   07/30/23 2200 07/30/23 2300 07/31/23 0000 07/31/23 0023  BP: (!) 142/77 (!) 141/75 (!) 127/59   Pulse: 63 68 69   Resp: 14 18 17    Temp:    97.8 F (36.6 C)  TempSrc:    Oral  SpO2: 90% 99% 95%    General: Not in acute distress HEENT:       Eyes: PERRL, EOMI, no jaundice       ENT: No discharge from the ears and nose, no pharynx injection, no tonsillar enlargement.        Neck: No JVD, no bruit, no mass felt. Heme: No neck lymph node enlargement. Cardiac: S1/S2, RRR, No murmurs, No gallops or rubs. Respiratory: No rales, wheezing, rhonchi or rubs. GI: Abdominal is distended, diffusely tender, no rebound pain, no organomegaly, BS present. GU: No hematuria Ext: No pitting leg edema bilaterally.  S/p of right BKA Musculoskeletal: No joint deformities, No joint redness or warmth, no limitation of ROM in spin. Skin: No rashes.  Neuro: Alert, oriented X3, cranial nerves II-XII grossly intact, moves all extremities  Psych: Patient is not psychotic, no suicidal or hemocidal ideation.  Labs on Admission: I have personally reviewed following labs and imaging studies  CBC: Recent Labs  Lab 07/30/23 1414  WBC 11.7*  HGB 9.4*  HCT 29.6*  MCV 94.3  PLT 254   Basic Metabolic Panel: Recent Labs  Lab 07/30/23 1414  NA 142  K 3.7  CL 108  CO2 25  GLUCOSE 119*  BUN 32*  CREATININE 1.68*  CALCIUM 8.6*   GFR: CrCl cannot be calculated (Unknown ideal weight.). Liver Function Tests: Recent Labs  Lab 07/30/23 1414  AST 13*  ALT 12  ALKPHOS 98  BILITOT 0.7  PROT 6.8  ALBUMIN 3.3*   Recent Labs  Lab 07/30/23 1414  LIPASE 46   No results for input(s): "AMMONIA" in the last 168 hours. Coagulation  Profile: No results for input(s): "INR", "PROTIME" in the last 168 hours. Cardiac Enzymes: No results for input(s): "CKTOTAL", "CKMB", "CKMBINDEX", "TROPONINI" in the last 168 hours. BNP (last 3 results) No results for input(s): "PROBNP" in the last 8760 hours. HbA1C: No results for input(s): "HGBA1C" in the last 72 hours. CBG: Recent Labs  Lab 07/30/23 2219  GLUCAP 123*   Lipid Profile: No results for input(s): "CHOL", "HDL", "LDLCALC", "TRIG", "CHOLHDL", "LDLDIRECT" in the last 72 hours. Thyroid Function Tests: No results for input(s): "TSH", "T4TOTAL", "FREET4", "T3FREE", "THYROIDAB" in the last 72 hours. Anemia Panel: No results for input(s): "VITAMINB12", "FOLATE", "FERRITIN", "TIBC", "IRON", "RETICCTPCT" in the last 72 hours. Urine analysis:    Component Value Date/Time   COLORURINE YELLOW (A) 07/30/2023 1650   APPEARANCEUR CLEAR (A) 07/30/2023 1650   LABSPEC 1.011 07/30/2023 1650   PHURINE 5.0 07/30/2023 1650   GLUCOSEU NEGATIVE 07/30/2023 1650   HGBUR NEGATIVE 07/30/2023 1650   BILIRUBINUR NEGATIVE 07/30/2023 1650   KETONESUR NEGATIVE 07/30/2023 1650   PROTEINUR 100 (A) 07/30/2023 1650   NITRITE NEGATIVE 07/30/2023 1650   LEUKOCYTESUR NEGATIVE 07/30/2023 1650   Sepsis Labs: @LABRCNTIP (procalcitonin:4,lacticidven:4) )No results found for this or any previous visit (from the past 240 hour(s)).   Radiological Exams on Admission: CT ABDOMEN PELVIS W CONTRAST  Result Date: 07/30/2023 CLINICAL DATA:  Abdominal pain EXAM: CT ABDOMEN AND PELVIS WITH CONTRAST TECHNIQUE: Multidetector CT imaging of the abdomen and pelvis was performed using the standard protocol following bolus administration of intravenous contrast. RADIATION DOSE REDUCTION: This exam was performed according to the departmental dose-optimization program which includes automated exposure control, adjustment of the mA and/or kV according to patient size and/or use of iterative reconstruction technique.  CONTRAST:  OMNIPAQUE IOHEXOL 300 MG/ML  SOLN COMPARISON:  02/14/2023 FINDINGS: Lower chest: Scarring/atelectasis in the right middle lobe and lingula, improved. Hepatobiliary: Liver is within normal limits. Gallbladder is notable for layering tiny gallstones (series 2/image 27), without associated inflammatory changes. No intrahepatic or extrahepatic duct dilatation. Pancreas: Within normal limits. Spleen: Calcified splenic granulomata. Adrenals/Urinary Tract: Adrenal glands are within normal limits. Kidneys are within normal limits. No renal, ureteral, or bladder calculi. No hydronephrosis. Bladder decompressed by an indwelling Foley catheter. Stomach/Bowel: Stomach is within normal limits. No evidence of bowel obstruction. Appendix is within normal limits (series 2/image 86). Moderate left colonic stool burden, with pericolonic stranding along the rectosigmoid colon (series 2/image 36), suggesting stercoral colitis. Vascular/Lymphatic: No evidence of abdominal aortic aneurysm. Atherosclerotic calcifications of the abdominal aorta and branch vessels, although vessels remain patent. No suspicious abdominopelvic lymphadenopathy. Reproductive: Prostate is unremarkable. Other: Mild abdominal ascites. Musculoskeletal: Mild degenerative changes of the visualized thoracolumbar spine. Median sternotomy. IMPRESSION: Suspected stercoral colitis involving the rectosigmoid colon. Underlying constipation. Cholelithiasis, without associated inflammatory changes. Electronically Signed   By: Charline Bills M.D.   On: 07/30/2023 19:01      Assessment/Plan Principal Problem:   Stercoral colitis Active Problems:   Essential hypertension   COPD (chronic obstructive pulmonary disease) (HCC)   Hypothyroidism   HLD (hyperlipidemia)   Type II diabetes mellitus with renal manifestations (HCC)   Chronic diastolic CHF (congestive heart failure) (HCC)   Iron deficiency anemia   CKD stage 3a, GFR 45-59 ml/min (HCC)    BPH (benign prostatic hyperplasia)   Assessment and Plan:  Stercoral colitis: WBC 11.7, no fever, not septic.  -Placed on MedSurg bed for observation -Cipro and Flagyl were started in ED, will continue -Follow-up of blood culture -MiraLAX and Senokot for constipation -As needed Percocet or morphine, Tylenol for pain  Essential hypertension -IV hydralazine as needed -Amlodipine, Coreg -Patient is on torsemide  COPD (chronic obstructive pulmonary disease) (HCC): Stable -Bronchodilators  Hypothyroidism -Synthroid  HLD (hyperlipidemia) -Lipitor  Type II diabetes mellitus with renal manifestations (HCC): Recent A1c 6.1, well-controlled.  Patient taking NovoLog -Sliding scale insulin  Chronic diastolic CHF (congestive heart failure) (HCC): 2D echo/8/24 showed EF 60 to 65%.  No leg edema.  CHF seem to be compensated. -Continue torsemide  Iron deficiency anemia: Hemoglobin stable 9.4 (8.9 on 04/16/2023) -Continue iron supplement  CKD stage 3a, GFR 45-59 ml/min (HCC): Stable.  Baseline creatinine 1.70 04/16/2023.  His creatinine is 7.68, BUN 32, GFR 46 -Follow-up with BMP  BPH (benign prostatic hyperplasia) -Flomax        DVT ppx: SQ Lovenox  Code Status: Full code    Family Communication: not done, no family member is at bed side.      Disposition Plan:  Anticipate discharge back to previous environment, SNF  Consults called:  none  Admission status and Level of care: Med-Surg:   for obs    Dispo: The patient is from: SNF              Anticipated d/c is to: SNF              Anticipated d/c date is: 1 day              Patient currently is not medically stable to d/c.    Severity of Illness:  The appropriate patient status for this patient is OBSERVATION. Observation status is judged to be reasonable and necessary in order to provide the required intensity of service to ensure the patient's safety. The patient's presenting symptoms, physical exam findings, and  initial radiographic and laboratory data in the context of their medical condition is felt to place them at decreased risk for further clinical deterioration. Furthermore, it is anticipated that the patient will be medically stable for discharge from the hospital within 2 midnights of admission.        Date of Service 07/31/2023    Lorretta Harp [facility]   If 7PM-7AM, please contact night-coverage [email or web address] 07/31/2023, 1:07 AM

## 2023-07-30 NOTE — Consult Note (Signed)
Pharmacy Antibiotic Note  Shane Chambers is a 62 y.o. male admitted on 07/30/2023 with Stercoral colitis Pharmacy has been consulted for ciprofloxacin dosing. Patient also receiving metronidazole 500 mg BID  Plan: Initiate Ciprofloxacin IV 400 mg BID Q12H Continue metronidazole BID as ordered      Temp (24hrs), Avg:98.1 F (36.7 C), Min:97.7 F (36.5 C), Max:98.5 F (36.9 C)  Recent Labs  Lab 07/30/23 1414  WBC 11.7*  CREATININE 1.68*    CrCl cannot be calculated (Unknown ideal weight.).    Allergies  Allergen Reactions   Ace Inhibitors Swelling and Cough    (Not on MAR at [name])   Haloperidol And Related     Do NOT give anti-psychotics due to risk of  Torsades and QT prolongation (per Dr. Teena Irani request)   Other Itching    [name]   Shellfish Allergy     Listed on MAR   Penicillins Itching and Rash    Has patient had a PCN reaction causing immediate rash, facial/tongue/throat swelling, SOB or lightheadedness with hypotension: Unknown Has patient had a PCN reaction causing severe rash involving mucus membranes or skin necrosis: Unknown Has patient had a PCN reaction that required hospitalization: Unknown Has patient had a PCN reaction occurring within the last 10 years: Unknown If all of the above answers are "NO", then may proceed with Cephalosporin use.  Tolerated ancef (12-17-22)     Antimicrobials this admission: 9/19 cipro >>  9/19 metronidazole >>   Dose adjustments this admission:   Microbiology results: 9/19 BCx: pending   Thank you for allowing pharmacy to be a part of this patient's care.  Sharen Hones, PharmD, BCPS Clinical Pharmacist   07/30/2023 9:24 PM

## 2023-07-30 NOTE — ED Notes (Signed)
[facility] called to get status of pt. [phone]

## 2023-07-30 NOTE — ED Provider Notes (Signed)
[facility] Provider Note    Event Date/Time   First MD Initiated Contact with Patient 07/30/23 1533     (approximate)   History   Chief Complaint Abdominal Pain   HPI  Shane Chambers is a 62 y.o. male with past medical history of hypertension, hyperlipidemia, diabetes, CAD status post CABG, CHF, COPD, CKD, and PVD status post right BKA who presents to the ED complaining of abdominal pain.  Patient reports that he has had 1 week of increasing pain across his entire abdomen, greatest in the bilateral lower quadrants.  This has been associated with increased distention and constipation.  Patient reports he has passed only a small amount of stool over the past week but continues to pass gas.  He reports feeling nauseous but has not vomited, denies any fevers or difficulty urinating.  He has not taken anything for constipation prior to arrival.     Physical Exam   Triage Vital Signs: ED Triage Vitals  Encounter Vitals Group     BP 07/30/23 1414 117/66     Systolic BP Percentile --      Diastolic BP Percentile --      Pulse Rate 07/30/23 1414 (!) 57     Resp 07/30/23 1414 18     Temp 07/30/23 1414 98.5 F (36.9 C)     Temp src --      SpO2 07/30/23 1414 93 %     Weight --      Height --      Head Circumference --      Peak Flow --      Pain Score 07/30/23 1410 7     Pain Loc --      Pain Education --      Exclude from Growth Chart --     Most recent vital signs: Vitals:   07/30/23 1933 07/30/23 1935  BP: (!) 140/62   Pulse:  62  Resp: (!) 27 12  Temp:    SpO2:  94%    Constitutional: Alert and oriented. Eyes: Conjunctivae are normal. Head: Atraumatic. Nose: No congestion/rhinnorhea. Mouth/Throat: Mucous membranes are moist.  Cardiovascular: Normal rate, regular rhythm. Grossly normal heart sounds.  2+ radial pulses bilaterally. Respiratory: Normal respiratory effort.  No retractions. Lungs CTAB. Gastrointestinal: Soft and tender to  palpation diffusely, greatest in the bilateral lower quadrants. N mild distention noted. Genitourinary: Foley catheter in place draining clear yellow urine. Musculoskeletal: No lower extremity tenderness nor edema, status post right BKA. Neurologic:  Normal speech and language. No gross focal neurologic deficits are appreciated.    ED Results / Procedures / Treatments   Labs (all labs ordered are listed, but only abnormal results are displayed) Labs Reviewed  COMPREHENSIVE METABOLIC PANEL - Abnormal; Notable for the following components:      Result Value   Glucose, Bld 119 (*)    BUN 32 (*)    Creatinine, Ser 1.68 (*)    Calcium 8.6 (*)    Albumin 3.3 (*)    AST 13 (*)    GFR, Estimated 46 (*)    All other components within normal limits  CBC - Abnormal; Notable for the following components:   WBC 11.7 (*)    RBC 3.14 (*)    Hemoglobin 9.4 (*)    HCT 29.6 (*)    All other components within normal limits  URINALYSIS, ROUTINE W REFLEX MICROSCOPIC - Abnormal; Notable for the following components:   Color, Urine YELLOW (*)  APPearance CLEAR (*)    Protein, ur 100 (*)    Bacteria, UA RARE (*)    All other components within normal limits  CULTURE, BLOOD (ROUTINE X 2)  CULTURE, BLOOD (ROUTINE X 2)  LIPASE, BLOOD  BRAIN NATRIURETIC PEPTIDE     EKG  ED ECG REPORT I, Chesley Noon, the attending physician, personally viewed and interpreted this ECG.   Date: 07/30/2023  EKG Time: 16:40  Rate: 60  Rhythm: normal sinus rhythm  Axis: Normal  Intervals:none  ST&T Change: None  RADIOLOGY CT abdomen/pelvis reviewed and interpreted by me with significant constipation and inflammatory changes of the large bowel.  PROCEDURES:  Critical Care performed: No  Procedures   MEDICATIONS ORDERED IN ED: Medications  ciprofloxacin (CIPRO) IVPB 400 mg (has no administration in time range)  metroNIDAZOLE (FLAGYL) IVPB 500 mg (has no administration in time range)  morphine (PF)  4 MG/ML injection 4 mg (has no administration in time range)  morphine (PF) 2 MG/ML injection 2 mg (has no administration in time range)  oxyCODONE-acetaminophen (PERCOCET/ROXICET) 5-325 MG per tablet 1 tablet (has no administration in time range)  ondansetron (ZOFRAN) injection 4 mg (has no administration in time range)  acetaminophen (TYLENOL) tablet 650 mg (has no administration in time range)  albuterol (VENTOLIN HFA) 108 (90 Base) MCG/ACT inhaler 2 puff (has no administration in time range)  dextromethorphan-guaiFENesin (MUCINEX DM) 30-600 MG per 12 hr tablet 1 tablet (has no administration in time range)  hydrALAZINE (APRESOLINE) injection 5 mg (has no administration in time range)  insulin aspart (novoLOG) injection 0-5 Units (has no administration in time range)  insulin aspart (novoLOG) injection 0-9 Units (has no administration in time range)  polyethylene glycol (MIRALAX / GLYCOLAX) packet 17 g (has no administration in time range)  senna-docusate (Senokot-S) tablet 1 tablet (has no administration in time range)  lactated ringers bolus 1,000 mL (0 mLs Intravenous Stopped 07/30/23 1932)  morphine (PF) 4 MG/ML injection 4 mg (4 mg Intravenous Given 07/30/23 1646)  iohexol (OMNIPAQUE) 300 MG/ML solution 100 mL (100 mLs Intravenous Contrast Given 07/30/23 1706)  ondansetron (ZOFRAN) injection 4 mg (4 mg Intravenous Given 07/30/23 1930)     IMPRESSION / MDM / ASSESSMENT AND PLAN / ED COURSE  I reviewed the triage vital signs and the nursing notes.                              62 y.o. male with past medical history of hypertension, hyperlipidemia, diabetes, CAD status post CABG, CHF, COPD, peripheral vascular disease status post right BKA, and CKD who presents to the ED complaining of 1 week of increasing abdominal pain, distention, and constipation.  Patient's presentation is most consistent with acute presentation with potential threat to life or bodily function.  Differential  diagnosis includes, but is not limited to, bowel obstruction, constipation, ileus, diverticulitis, appendicitis, kidney stone, pancreatitis, hepatitis, gastritis.  Patient chronically ill-appearing but nontoxic and in no acute distress, vital signs are unremarkable.  His abdomen is soft but mildly distended with diffuse tenderness to palpation, greatest in the bilateral lower quadrants.  Labs are reassuring with stable CKD, no acute electrolyte abnormality, anemia, or leukocytosis noted.  LFTs and lipase are unremarkable, we will treat symptomatically with IV morphine and further assess with CT imaging.  CT imaging is consistent with stercoral colitis, patient continues to have significant pain from this and we will give a follow-up dose of IV morphine.  He would benefit from admission for antibiotics and bowel regimen, will treat with Cipro given his reported allergy to penicillins and cephalosporins.  Chart notes history of QT prolongation, no QT prolongation noted on EKG here in the ED.  Case discussed with hospitalist for admission.      FINAL CLINICAL IMPRESSION(S) / ED DIAGNOSES   Final diagnoses:  Stercoral colitis  Generalized abdominal pain     Rx / DC Orders   ED Discharge Orders     None        Note:  This document was prepared using Dragon voice recognition software and may include unintentional dictation errors.   Chesley Noon, MD 07/30/23 2100

## 2023-07-31 DIAGNOSIS — Z89511 Acquired absence of right leg below knee: Secondary | ICD-10-CM | POA: Diagnosis not present

## 2023-07-31 DIAGNOSIS — J449 Chronic obstructive pulmonary disease, unspecified: Secondary | ICD-10-CM | POA: Diagnosis present

## 2023-07-31 DIAGNOSIS — I5032 Chronic diastolic (congestive) heart failure: Secondary | ICD-10-CM | POA: Diagnosis present

## 2023-07-31 DIAGNOSIS — Z794 Long term (current) use of insulin: Secondary | ICD-10-CM | POA: Diagnosis not present

## 2023-07-31 DIAGNOSIS — K219 Gastro-esophageal reflux disease without esophagitis: Secondary | ICD-10-CM | POA: Diagnosis present

## 2023-07-31 DIAGNOSIS — I2510 Atherosclerotic heart disease of native coronary artery without angina pectoris: Secondary | ICD-10-CM | POA: Diagnosis present

## 2023-07-31 DIAGNOSIS — E785 Hyperlipidemia, unspecified: Secondary | ICD-10-CM | POA: Diagnosis present

## 2023-07-31 DIAGNOSIS — R1084 Generalized abdominal pain: Secondary | ICD-10-CM | POA: Diagnosis present

## 2023-07-31 DIAGNOSIS — N179 Acute kidney failure, unspecified: Secondary | ICD-10-CM | POA: Diagnosis present

## 2023-07-31 DIAGNOSIS — I255 Ischemic cardiomyopathy: Secondary | ICD-10-CM | POA: Diagnosis present

## 2023-07-31 DIAGNOSIS — I252 Old myocardial infarction: Secondary | ICD-10-CM | POA: Diagnosis not present

## 2023-07-31 DIAGNOSIS — D509 Iron deficiency anemia, unspecified: Secondary | ICD-10-CM | POA: Diagnosis present

## 2023-07-31 DIAGNOSIS — Z79890 Hormone replacement therapy (postmenopausal): Secondary | ICD-10-CM | POA: Diagnosis not present

## 2023-07-31 DIAGNOSIS — N400 Enlarged prostate without lower urinary tract symptoms: Secondary | ICD-10-CM | POA: Diagnosis present

## 2023-07-31 DIAGNOSIS — Z87891 Personal history of nicotine dependence: Secondary | ICD-10-CM | POA: Diagnosis not present

## 2023-07-31 DIAGNOSIS — I130 Hypertensive heart and chronic kidney disease with heart failure and stage 1 through stage 4 chronic kidney disease, or unspecified chronic kidney disease: Secondary | ICD-10-CM | POA: Diagnosis present

## 2023-07-31 DIAGNOSIS — E669 Obesity, unspecified: Secondary | ICD-10-CM | POA: Diagnosis present

## 2023-07-31 DIAGNOSIS — N1831 Chronic kidney disease, stage 3a: Secondary | ICD-10-CM | POA: Diagnosis present

## 2023-07-31 DIAGNOSIS — E1151 Type 2 diabetes mellitus with diabetic peripheral angiopathy without gangrene: Secondary | ICD-10-CM | POA: Diagnosis present

## 2023-07-31 DIAGNOSIS — G4730 Sleep apnea, unspecified: Secondary | ICD-10-CM | POA: Diagnosis present

## 2023-07-31 DIAGNOSIS — E1122 Type 2 diabetes mellitus with diabetic chronic kidney disease: Secondary | ICD-10-CM | POA: Diagnosis present

## 2023-07-31 DIAGNOSIS — E039 Hypothyroidism, unspecified: Secondary | ICD-10-CM | POA: Diagnosis present

## 2023-07-31 DIAGNOSIS — K5289 Other specified noninfective gastroenteritis and colitis: Secondary | ICD-10-CM | POA: Diagnosis present

## 2023-07-31 DIAGNOSIS — Z8249 Family history of ischemic heart disease and other diseases of the circulatory system: Secondary | ICD-10-CM | POA: Diagnosis not present

## 2023-07-31 DIAGNOSIS — K5900 Constipation, unspecified: Secondary | ICD-10-CM | POA: Diagnosis present

## 2023-07-31 LAB — CBG MONITORING, ED
Glucose-Capillary: 108 mg/dL — ABNORMAL HIGH (ref 70–99)
Glucose-Capillary: 155 mg/dL — ABNORMAL HIGH (ref 70–99)

## 2023-07-31 LAB — GLUCOSE, CAPILLARY
Glucose-Capillary: 173 mg/dL — ABNORMAL HIGH (ref 70–99)
Glucose-Capillary: 162 mg/dL — ABNORMAL HIGH (ref 70–99)

## 2023-07-31 LAB — BASIC METABOLIC PANEL
Anion gap: 9 (ref 5–15)
BUN: 31 mg/dL — ABNORMAL HIGH (ref 6–20)
CO2: 25 mmol/L (ref 22–32)
Calcium: 8.2 mg/dL — ABNORMAL LOW (ref 8.9–10.3)
Chloride: 105 mmol/L (ref 98–111)
Creatinine, Ser: 1.58 mg/dL — ABNORMAL HIGH (ref 0.61–1.24)
GFR, Estimated: 50 mL/min — ABNORMAL LOW (ref >60)
Glucose, Bld: 132 mg/dL — ABNORMAL HIGH (ref 70–99)
Potassium: 3.7 mmol/L (ref 3.5–5.1)
Sodium: 139 mmol/L (ref 135–145)

## 2023-07-31 LAB — CBC
HCT: 30.2 % — ABNORMAL LOW (ref 39.0–52.0)
Hemoglobin: 9.1 g/dL — ABNORMAL LOW (ref 13.0–17.0)
MCH: 29.3 pg (ref 26.0–34.0)
MCHC: 30.1 g/dL (ref 30.0–36.0)
MCV: 97.1 fL (ref 80.0–100.0)
Platelets: 252 10*3/uL (ref 150–400)
RBC: 3.11 MIL/uL — ABNORMAL LOW (ref 4.22–5.81)
RDW: 14.1 % (ref 11.5–15.5)
WBC: 13.6 10*3/uL — ABNORMAL HIGH (ref 4.0–10.5)
nRBC: 0 % (ref 0.0–0.2)

## 2023-07-31 LAB — HIV ANTIBODY (ROUTINE TESTING W REFLEX): HIV Screen 4th Generation wRfx: NONREACTIVE

## 2023-07-31 MED ORDER — PANTOPRAZOLE SODIUM 40 MG PO TBEC
40.0000 mg | DELAYED_RELEASE_TABLET | Freq: Every day | ORAL | Status: DC
  Administered 2023-07-31 – 2023-08-03 (×4): 40 mg via ORAL
  Filled 2023-07-31 (×4): qty 1

## 2023-07-31 MED ORDER — ATORVASTATIN CALCIUM 20 MG PO TABS
40.0000 mg | ORAL_TABLET | Freq: Every day | ORAL | Status: DC
  Administered 2023-07-31 – 2023-08-02 (×3): 40 mg via ORAL
  Filled 2023-07-31 (×3): qty 2

## 2023-07-31 MED ORDER — CHLORHEXIDINE GLUCONATE CLOTH 2 % EX PADS
6.0000 | MEDICATED_PAD | Freq: Every day | CUTANEOUS | Status: DC
  Administered 2023-07-31 – 2023-08-03 (×4): 6 via TOPICAL

## 2023-07-31 MED ORDER — FERROUS GLUCONATE 324 (38 FE) MG PO TABS
324.0000 mg | ORAL_TABLET | Freq: Every day | ORAL | Status: DC
  Administered 2023-07-31 – 2023-08-03 (×4): 324 mg via ORAL
  Filled 2023-07-31 (×4): qty 1

## 2023-07-31 MED ORDER — LEVOTHYROXINE SODIUM 50 MCG PO TABS
75.0000 ug | ORAL_TABLET | Freq: Every day | ORAL | Status: DC
  Administered 2023-07-31 – 2023-08-03 (×4): 75 ug via ORAL
  Filled 2023-07-31 (×3): qty 1
  Filled 2023-07-31: qty 2

## 2023-07-31 MED ORDER — ASPIRIN 81 MG PO TBEC
81.0000 mg | DELAYED_RELEASE_TABLET | Freq: Every day | ORAL | Status: DC
  Administered 2023-07-31 – 2023-08-03 (×4): 81 mg via ORAL
  Filled 2023-07-31 (×4): qty 1

## 2023-07-31 MED ORDER — AMLODIPINE BESYLATE 10 MG PO TABS
10.0000 mg | ORAL_TABLET | Freq: Every day | ORAL | Status: DC
  Administered 2023-07-31 – 2023-08-03 (×4): 10 mg via ORAL
  Filled 2023-07-31: qty 1
  Filled 2023-07-31: qty 2
  Filled 2023-07-31 (×2): qty 1

## 2023-07-31 MED ORDER — TAMSULOSIN HCL 0.4 MG PO CAPS
0.8000 mg | ORAL_CAPSULE | Freq: Every evening | ORAL | Status: DC
  Administered 2023-07-31 – 2023-08-02 (×3): 0.8 mg via ORAL
  Filled 2023-07-31 (×3): qty 2

## 2023-07-31 MED ORDER — TORSEMIDE 20 MG PO TABS
20.0000 mg | ORAL_TABLET | Freq: Every day | ORAL | Status: DC
  Administered 2023-07-31: 20 mg via ORAL
  Filled 2023-07-31: qty 1

## 2023-07-31 MED ORDER — MOMETASONE FURO-FORMOTEROL FUM 200-5 MCG/ACT IN AERO
2.0000 | INHALATION_SPRAY | Freq: Two times a day (BID) | RESPIRATORY_TRACT | Status: DC
  Administered 2023-07-31 – 2023-08-03 (×6): 2 via RESPIRATORY_TRACT
  Filled 2023-07-31 (×2): qty 8.8

## 2023-07-31 MED ORDER — GABAPENTIN 300 MG PO CAPS
300.0000 mg | ORAL_CAPSULE | Freq: Every day | ORAL | Status: DC
  Administered 2023-07-31 – 2023-08-02 (×3): 300 mg via ORAL
  Filled 2023-07-31 (×3): qty 1

## 2023-07-31 MED ORDER — CARVEDILOL 25 MG PO TABS
25.0000 mg | ORAL_TABLET | Freq: Two times a day (BID) | ORAL | Status: DC
  Administered 2023-07-31 – 2023-08-02 (×6): 25 mg via ORAL
  Filled 2023-07-31 (×6): qty 1

## 2023-07-31 MED ORDER — IPRATROPIUM-ALBUTEROL 0.5-2.5 (3) MG/3ML IN SOLN: 3.0000 mL | Freq: Four times a day (QID) | RESPIRATORY_TRACT | Status: DC | PRN

## 2023-07-31 NOTE — Consult Note (Signed)
Pharmacy Antibiotic Note  Shane Chambers is a 62 y.o. male admitted on 07/30/2023 with Stercoral colitis Pharmacy has been consulted for ciprofloxacin dosing. Patient also receiving metronidazole 500 mg BID  Plan: Continue Ciprofloxacin IV 400 mg BID Q12H Continue metronidazole BID as ordered      Temp (24hrs), Avg:98.1 F (36.7 C), Min:97.7 F (36.5 C), Max:98.6 F (37 C)  Recent Labs  Lab 07/30/23 1414 07/31/23 0454  WBC 11.7* 13.6*  CREATININE 1.68* 1.58*    CrCl cannot be calculated (Unknown ideal weight.).    Allergies  Allergen Reactions   Ace Inhibitors Swelling and Cough    (Not on MAR at [name])   Haloperidol And Related     Do NOT give anti-psychotics due to risk of  Torsades and QT prolongation (per Dr. Teena Irani request)   Other Itching    [name]   Shellfish Allergy     Listed on MAR   Penicillins Itching and Rash    Has patient had a PCN reaction causing immediate rash, facial/tongue/throat swelling, SOB or lightheadedness with hypotension: Unknown Has patient had a PCN reaction causing severe rash involving mucus membranes or skin necrosis: Unknown Has patient had a PCN reaction that required hospitalization: Unknown Has patient had a PCN reaction occurring within the last 10 years: Unknown If all of the above answers are "NO", then may proceed with Cephalosporin use.  Tolerated ancef (12-17-22)     Antimicrobials this admission: 9/19 cipro >>  9/19 metronidazole >>   Dose adjustments this admission:   Microbiology results: 9/19 BCx: pending   Thank you for allowing pharmacy to be a part of this patient's care.  Merryl Hacker, PharmD Clinical Pharmacist   07/31/2023 10:50 AM

## 2023-07-31 NOTE — ED Notes (Signed)
Pt 86% on RA while sleeping, placed on 2 L [place] to maintain SpO2 >92%

## 2023-07-31 NOTE — ED Notes (Signed)
Snackbox and diet ginger-ale provided.

## 2023-07-31 NOTE — ED Notes (Signed)
Pt c/o 8/10 abd pain and nausea. See [facility]

## 2023-07-31 NOTE — Plan of Care (Signed)
  Problem: Education: Goal: Knowledge of the prescribed therapeutic regimen will improve Outcome: Progressing Goal: Ability to verbalize activity precautions or restrictions will improve Outcome: Progressing Goal: Understanding of discharge needs will improve Outcome: Progressing   Problem: Activity: Goal: Ability to perform//tolerate increased activity and mobilize with assistive devices will improve Outcome: Progressing   Problem: Clinical Measurements: Goal: Postoperative complications will be avoided or minimized Outcome: Progressing   Problem: Self-Care: Goal: Ability to meet self-care needs will improve Outcome: Progressing   Problem: Self-Concept: Goal: Ability to maintain and perform role responsibilities to the fullest extent possible will improve Outcome: Progressing   Problem: Pain Management: Goal: Pain level will decrease with appropriate interventions Outcome: Progressing   Problem: Skin Integrity: Goal: Demonstration of wound healing without infection will improve Outcome: Progressing   Problem: Education: Goal: Ability to describe self-care measures that may prevent or decrease complications (Diabetes Survival Skills Education) will improve Outcome: Progressing Goal: Individualized Educational Video(s) Outcome: Progressing   Problem: Coping: Goal: Ability to adjust to condition or change in health will improve Outcome: Progressing   Problem: Fluid Volume: Goal: Ability to maintain a balanced intake and output will improve Outcome: Progressing   Problem: Health Behavior/Discharge Planning: Goal: Ability to identify and utilize available resources and services will improve Outcome: Progressing Goal: Ability to manage health-related needs will improve Outcome: Progressing   Problem: Metabolic: Goal: Ability to maintain appropriate glucose levels will improve Outcome: Progressing   Problem: Nutritional: Goal: Maintenance of adequate nutrition will  improve Outcome: Progressing Goal: Progress toward achieving an optimal weight will improve Outcome: Progressing   Problem: Skin Integrity: Goal: Risk for impaired skin integrity will decrease Outcome: Progressing   Problem: Tissue Perfusion: Goal: Adequacy of tissue perfusion will improve Outcome: Progressing   Problem: Education: Goal: Knowledge of General Education information will improve Description: Including pain rating scale, medication(s)/side effects and non-pharmacologic comfort measures Outcome: Progressing   Problem: Health Behavior/Discharge Planning: Goal: Ability to manage health-related needs will improve Outcome: Progressing   Problem: Clinical Measurements: Goal: Ability to maintain clinical measurements within normal limits will improve Outcome: Progressing Goal: Will remain free from infection Outcome: Progressing Goal: Diagnostic test results will improve Outcome: Progressing Goal: Respiratory complications will improve Outcome: Progressing Goal: Cardiovascular complication will be avoided Outcome: Progressing   Problem: Activity: Goal: Risk for activity intolerance will decrease Outcome: Progressing   Problem: Nutrition: Goal: Adequate nutrition will be maintained Outcome: Progressing   Problem: Coping: Goal: Level of anxiety will decrease Outcome: Progressing   Problem: Elimination: Goal: Will not experience complications related to bowel motility Outcome: Progressing Goal: Will not experience complications related to urinary retention Outcome: Progressing   Problem: Pain Managment: Goal: General experience of comfort will improve Outcome: Progressing   Problem: Safety: Goal: Ability to remain free from injury will improve Outcome: Progressing   Problem: Skin Integrity: Goal: Risk for impaired skin integrity will decrease Outcome: Progressing   

## 2023-07-31 NOTE — ED Notes (Signed)
Juice, soda and warm blanket provided

## 2023-07-31 NOTE — ED Notes (Signed)
Pt cleaned of bowel incontinence.

## 2023-07-31 NOTE — Plan of Care (Signed)
Problem: Education: Goal: Knowledge of the prescribed therapeutic regimen will improve Outcome: Progressing Goal: Ability to verbalize activity precautions or restrictions will improve Outcome: Progressing Goal: Understanding of discharge needs will improve Outcome: Progressing   Problem: Activity: Goal: Ability to perform//tolerate increased activity and mobilize with assistive devices will improve Outcome: Progressing   Problem: Clinical Measurements: Goal: Postoperative complications will be avoided or minimized Outcome: Progressing   Problem: Self-Care: Goal: Ability to meet self-care needs will improve Outcome: Progressing   Problem: Self-Concept: Goal: Ability to maintain and perform role responsibilities to the fullest extent possible will improve Outcome: Progressing   Problem: Pain Management: Goal: Pain level will decrease with appropriate interventions Outcome: Progressing   Problem: Skin Integrity: Goal: Demonstration of wound healing without infection will improve Outcome: Progressing   Problem: Education: Goal: Ability to describe self-care measures that may prevent or decrease complications (Diabetes Survival Skills Education) will improve Outcome: Progressing Goal: Individualized Educational Video(s) Outcome: Progressing   Problem: Coping: Goal: Ability to adjust to condition or change in health will improve Outcome: Progressing   Problem: Fluid Volume: Goal: Ability to maintain a balanced intake and output will improve Outcome: Progressing   Problem: Health Behavior/Discharge Planning: Goal: Ability to identify and utilize available resources and services will improve Outcome: Progressing Goal: Ability to manage health-related needs will improve Outcome: Progressing   Problem: Metabolic: Goal: Ability to maintain appropriate glucose levels will improve Outcome: Progressing   Problem: Nutritional: Goal: Maintenance of adequate nutrition will  improve Outcome: Progressing Goal: Progress toward achieving an optimal weight will improve Outcome: Progressing   Problem: Skin Integrity: Goal: Risk for impaired skin integrity will decrease Outcome: Progressing   Problem: Tissue Perfusion: Goal: Adequacy of tissue perfusion will improve Outcome: Progressing   Problem: Education: Goal: Knowledge of General Education information will improve Description: Including pain rating scale, medication(s)/side effects and non-pharmacologic comfort measures Outcome: Progressing   Problem: Health Behavior/Discharge Planning: Goal: Ability to manage health-related needs will improve Outcome: Progressing   Problem: Clinical Measurements: Goal: Ability to maintain clinical measurements within normal limits will improve Outcome: Progressing Goal: Will remain free from infection Outcome: Progressing Goal: Diagnostic test results will improve Outcome: Progressing Goal: Respiratory complications will improve Outcome: Progressing Goal: Cardiovascular complication will be avoided Outcome: Progressing   Problem: Activity: Goal: Risk for activity intolerance will decrease Outcome: Progressing   Problem: Nutrition: Goal: Adequate nutrition will be maintained Outcome: Progressing   Problem: Coping: Goal: Level of anxiety will decrease Outcome: Progressing   Problem: Elimination: Goal: Will not experience complications related to bowel motility Outcome: Progressing Goal: Will not experience complications related to urinary retention Outcome: Progressing   Problem: Pain Managment: Goal: General experience of comfort will improve Outcome: Progressing   Problem: Safety: Goal: Ability to remain free from injury will improve Outcome: Progressing   Problem: Skin Integrity: Goal: Risk for impaired skin integrity will decrease Outcome: Progressing

## 2023-07-31 NOTE — Progress Notes (Signed)
PROGRESS NOTE    COCHISE WESP  ZOX:[id number] DOB: 04-18-1961 DOA: 07/30/2023 PCP: [name], MD   Assessment & Plan:   Principal Problem:   Stercoral colitis Active Problems:   Essential hypertension   COPD (chronic obstructive pulmonary disease) (HCC)   Hypothyroidism   HLD (hyperlipidemia)   Type II diabetes mellitus with renal manifestations (HCC)   Chronic diastolic CHF (congestive heart failure) (HCC)   Iron deficiency anemia   CKD stage 3a, GFR 45-59 ml/min (HCC)   BPH (benign prostatic hyperplasia)  Assessment and Plan: Stercoral colitis: continue on miralax, senokot & continue on cipro, flagyl    HTN: continue on amlodipine, coreg, torsemide. IV hydralazine prn    COPD: w/o excerebration. Bronchodilators prn    Hypothyroidism: continue on home dose of synthroid    HLD: continue on statin    DM2: HbA1c 6.1, well controlled. Continue on SSI w/ accuchecks    Chronic diastolic CHF:  echo 8/24 showed EF 60 to 65%. No leg edema. CHF compensated. Continue torsemide. Monitor I/Os   Iron deficiency anemia: continue on iron supplement    CKDIIa: baseline Cr 1.7. Cr is labile    BPH: continue on flomax       DVT prophylaxis: lovenox  Code Status: full  Family Communication: Disposition Plan:likely d/c back to home facility   Level of care: Med-Surg Status is: Inpatient Remains inpatient appropriate because: severity of illness      Consultants:    Procedures:   Antimicrobials: cipro, flagyl    Subjective: Pt c/o constipation   Objective: Vitals:   07/31/23 0600 07/31/23 0800 07/31/23 0804 07/31/23 0905  BP: (!) 106/56 (!) 141/66    Pulse: 62 65 65   Resp:  18    Temp:    98.1 F (36.7 C)  TempSrc:    Oral  SpO2: 100% 95% 98%     Intake/Output Summary (Last 24 hours) at 07/31/2023 0913 Last data filed at 07/31/2023 0021 Gross per 24 hour  Intake 1104.7 ml  Output --  Net 1104.7 ml   There were no vitals filed for this  visit.  Examination:  General exam: Appears calm and comfortable  Respiratory system: Clear to auscultation. Respiratory effort normal. Cardiovascular system: S1 & S2 +. No rubs, gallops or clicks. Gastrointestinal system: Abdomen is nondistended, soft and nontender. Normal bowel sounds heard. Central nervous system: Alert and oriented. Moves all extremities  Psychiatry: Judgement and insight appear normal. Flat mood and affect     Data Reviewed: I have personally reviewed following labs and imaging studies  CBC: Recent Labs  Lab 07/30/23 1414 07/31/23 0454  WBC 11.7* 13.6*  HGB 9.4* 9.1*  HCT 29.6* 30.2*  MCV 94.3 97.1  PLT 254 252   Basic Metabolic Panel: Recent Labs  Lab 07/30/23 1414 07/31/23 0454  NA 142 139  K 3.7 3.7  CL 108 105  CO2 25 25  GLUCOSE 119* 132*  BUN 32* 31*  CREATININE 1.68* 1.58*  CALCIUM 8.6* 8.2*   GFR: CrCl cannot be calculated (Unknown ideal weight.). Liver Function Tests: Recent Labs  Lab 07/30/23 1414  AST 13*  ALT 12  ALKPHOS 98  BILITOT 0.7  PROT 6.8  ALBUMIN 3.3*   Recent Labs  Lab 07/30/23 1414  LIPASE 46   No results for input(s): "AMMONIA" in the last 168 hours. Coagulation Profile: No results for input(s): "INR", "PROTIME" in the last 168 hours. Cardiac Enzymes: No results for input(s): "CKTOTAL", "CKMB", "CKMBINDEX", "TROPONINI" in the  last 168 hours. BNP (last 3 results) No results for input(s): "PROBNP" in the last 8760 hours. HbA1C: No results for input(s): "HGBA1C" in the last 72 hours. CBG: Recent Labs  Lab 07/30/23 2219 07/31/23 0803  GLUCAP 123* 108*   Lipid Profile: No results for input(s): "CHOL", "HDL", "LDLCALC", "TRIG", "CHOLHDL", "LDLDIRECT" in the last 72 hours. Thyroid Function Tests: No results for input(s): "TSH", "T4TOTAL", "FREET4", "T3FREE", "THYROIDAB" in the last 72 hours. Anemia Panel: No results for input(s): "VITAMINB12", "FOLATE", "FERRITIN", "TIBC", "IRON", "RETICCTPCT" in the  last 72 hours. Sepsis Labs: No results for input(s): "PROCALCITON", "LATICACIDVEN" in the last 168 hours.  Recent Results (from the past 240 hour(s))  Culture, blood (Routine X 2) w Reflex to ID Panel     Status: None (Preliminary result)   Collection Time: 07/30/23  9:29 PM   Specimen: BLOOD LEFT ARM  Result Value Ref Range Status   Specimen Description BLOOD LEFT ARM  Final   Special Requests   Final    BOTTLES DRAWN AEROBIC AND ANAEROBIC Blood Culture adequate volume   Culture   Final    NO GROWTH < 12 HOURS Performed at [facility], [address], [place], [place] [address]    Report Status PENDING  Incomplete  Culture, blood (Routine X 2) w Reflex to ID Panel     Status: None (Preliminary result)   Collection Time: 07/30/23  9:29 PM   Specimen: BLOOD LEFT HAND  Result Value Ref Range Status   Specimen Description BLOOD LEFT HAND  Final   Special Requests   Final    BOTTLES DRAWN AEROBIC AND ANAEROBIC Blood Culture adequate volume   Culture   Final    NO GROWTH < 12 HOURS Performed at [facility], [address] [place], [place] [address]    Report Status PENDING  Incomplete         Radiology Studies: CT ABDOMEN PELVIS W CONTRAST  Result Date: 07/30/2023 CLINICAL DATA:  Abdominal pain EXAM: CT ABDOMEN AND PELVIS WITH CONTRAST TECHNIQUE: Multidetector CT imaging of the abdomen and pelvis was performed using the standard protocol following bolus administration of intravenous contrast. RADIATION DOSE REDUCTION: This exam was performed according to the departmental dose-optimization program which includes automated exposure control, adjustment of the mA and/or kV according to patient size and/or use of iterative reconstruction technique. CONTRAST:  OMNIPAQUE IOHEXOL 300 MG/ML  SOLN COMPARISON:  02/14/2023 FINDINGS: Lower chest: Scarring/atelectasis in the right middle lobe and lingula, improved. Hepatobiliary: Liver is within normal limits.  Gallbladder is notable for layering tiny gallstones (series 2/image 27), without associated inflammatory changes. No intrahepatic or extrahepatic duct dilatation. Pancreas: Within normal limits. Spleen: Calcified splenic granulomata. Adrenals/Urinary Tract: Adrenal glands are within normal limits. Kidneys are within normal limits. No renal, ureteral, or bladder calculi. No hydronephrosis. Bladder decompressed by an indwelling Foley catheter. Stomach/Bowel: Stomach is within normal limits. No evidence of bowel obstruction. Appendix is within normal limits (series 2/image 86). Moderate left colonic stool burden, with pericolonic stranding along the rectosigmoid colon (series 2/image 36), suggesting stercoral colitis. Vascular/Lymphatic: No evidence of abdominal aortic aneurysm. Atherosclerotic calcifications of the abdominal aorta and branch vessels, although vessels remain patent. No suspicious abdominopelvic lymphadenopathy. Reproductive: Prostate is unremarkable. Other: Mild abdominal ascites. Musculoskeletal: Mild degenerative changes of the visualized thoracolumbar spine. Median sternotomy. IMPRESSION: Suspected stercoral colitis involving the rectosigmoid colon. Underlying constipation. Cholelithiasis, without associated inflammatory changes. Electronically Signed   By: Charline Bills M.D.   On: 07/30/2023 19:01  Scheduled Meds:  amLODipine  10 mg Oral Daily   aspirin EC  81 mg Oral Q breakfast   atorvastatin  40 mg Oral QHS   carvedilol  25 mg Oral BID WC   enoxaparin (LOVENOX) injection  40 mg Subcutaneous Q24H   ferrous gluconate  324 mg Oral Daily   gabapentin  300 mg Oral QHS   insulin aspart  0-5 Units Subcutaneous QHS   insulin aspart  0-9 Units Subcutaneous TID WC   levothyroxine  75 mcg Oral QAC breakfast   mometasone-formoterol  2 puff Inhalation BID   pantoprazole  40 mg Oral Daily   polyethylene glycol  17 g Oral Daily   senna-docusate  1 tablet Oral BID   tamsulosin   0.8 mg Oral QPM   torsemide  20 mg Oral Daily   Continuous Infusions:  ciprofloxacin     metronidazole       LOS: 0 days       Charise Killian, MD [facility] Pager [phone]  If 7PM-7AM, please contact night-coverage [email or web address] 07/31/2023, 9:13 AM

## 2023-08-01 DIAGNOSIS — K5289 Other specified noninfective gastroenteritis and colitis: Secondary | ICD-10-CM | POA: Diagnosis not present

## 2023-08-01 LAB — BASIC METABOLIC PANEL
Anion gap: 8 (ref 5–15)
BUN: 37 mg/dL — ABNORMAL HIGH (ref 6–20)
CO2: 27 mmol/L (ref 22–32)
Calcium: 8 mg/dL — ABNORMAL LOW (ref 8.9–10.3)
Chloride: 101 mmol/L (ref 98–111)
Creatinine, Ser: 2.35 mg/dL — ABNORMAL HIGH (ref 0.61–1.24)
GFR, Estimated: 31 mL/min — ABNORMAL LOW (ref >60)
Glucose, Bld: 90 mg/dL (ref 70–99)
Potassium: 4.2 mmol/L (ref 3.5–5.1)
Sodium: 136 mmol/L (ref 135–145)

## 2023-08-01 LAB — CBC
HCT: 24.7 % — ABNORMAL LOW (ref 39.0–52.0)
Hemoglobin: 7.8 g/dL — ABNORMAL LOW (ref 13.0–17.0)
MCH: 29.8 pg (ref 26.0–34.0)
MCHC: 31.6 g/dL (ref 30.0–36.0)
MCV: 94.3 fL (ref 80.0–100.0)
Platelets: 210 10*3/uL (ref 150–400)
RBC: 2.62 MIL/uL — ABNORMAL LOW (ref 4.22–5.81)
RDW: 13.9 % (ref 11.5–15.5)
WBC: 8.2 10*3/uL (ref 4.0–10.5)
nRBC: 0 % (ref 0.0–0.2)

## 2023-08-01 LAB — GLUCOSE, CAPILLARY
Glucose-Capillary: 103 mg/dL — ABNORMAL HIGH (ref 70–99)
Glucose-Capillary: 103 mg/dL — ABNORMAL HIGH (ref 70–99)
Glucose-Capillary: 117 mg/dL — ABNORMAL HIGH (ref 70–99)
Glucose-Capillary: 128 mg/dL — ABNORMAL HIGH (ref 70–99)

## 2023-08-01 MED ORDER — SODIUM CHLORIDE 0.9 % IV SOLN: INTRAVENOUS | Status: AC

## 2023-08-01 MED ORDER — GLUCERNA SHAKE PO LIQD
237.0000 mL | Freq: Three times a day (TID) | ORAL | Status: DC
  Administered 2023-08-01 – 2023-08-03 (×7): 237 mL via ORAL

## 2023-08-01 MED ORDER — LACTULOSE 10 GM/15ML PO SOLN
20.0000 g | Freq: Two times a day (BID) | ORAL | Status: DC
  Administered 2023-08-01 – 2023-08-03 (×4): 20 g via ORAL
  Filled 2023-08-01 (×4): qty 30

## 2023-08-01 MED ORDER — SODIUM CHLORIDE 0.9 % IV SOLN: INTRAVENOUS | Status: DC

## 2023-08-01 MED ORDER — SODIUM CHLORIDE 0.9 % IV SOLN: INTRAVENOUS | Status: DC | PRN

## 2023-08-01 NOTE — Progress Notes (Signed)
PROGRESS NOTE    Shane Chambers  ZOX:[id number] DOB: Feb 14, 1961 DOA: 07/30/2023 PCP: [name], MD   Assessment & Plan:   Principal Problem:   Stercoral colitis Active Problems:   Essential hypertension   COPD (chronic obstructive pulmonary disease) (HCC)   Hypothyroidism   HLD (hyperlipidemia)   Type II diabetes mellitus with renal manifestations (HCC)   Chronic diastolic CHF (congestive heart failure) (HCC)   Iron deficiency anemia   CKD stage 3a, GFR 45-59 ml/min (HCC)   BPH (benign prostatic hyperplasia)  Assessment and Plan: Stercoral colitis: continue on miralax, senokot & continue on cipro, flagyl    HTN: continue on coreg, torsemide, amlodipine, torsemide. IV hydralazine prn   COPD: w/o excerebration. Bronchodilators prn    Hypothyroidism: continue on levothyroxine     HLD: continue on statin    DM2: HbA1c 6.1, well controlled. Continue on SSI w/ accuchecks    Chronic diastolic CHF:  echo 8/24 showed EF 60 to 65%. No leg edema. CHF compensated. Holding torsemide. Monitor I/Os   Iron deficiency anemia: continue on iron supplement    CKDIIa: baseline Cr 1.7. Cr is trending up from day prior. Started on IVFs x 8 hrs   BPH: continue on flomax        DVT prophylaxis: lovenox  Code Status: full  Family Communication: Disposition Plan:likely d/c back to home facility   Level of care: Med-Surg Status is: Inpatient Remains inpatient appropriate because: severity of illness      Consultants:    Procedures:   Antimicrobials: cipro, flagyl    Subjective: Pt c/o fatigue   Objective: Vitals:   07/31/23 1645 07/31/23 2051 08/01/23 0107 08/01/23 0439  BP: (!) 117/51 (!) 116/53  (!) 121/57  Pulse: (!) 55 (!) 59  (!) 55  Resp: 20 20  16   Temp: 98.3 F (36.8 C) 98.2 F (36.8 C)  98.6 F (37 C)  TempSrc:      SpO2: 97% 98%  99%  Weight:   107.4 kg     Intake/Output Summary (Last 24 hours) at 08/01/2023 0753 Last data filed at 08/01/2023  0523 Gross per 24 hour  Intake 604.29 ml  Output 550 ml  Net 54.29 ml   Filed Weights   08/01/23 0107  Weight: 107.4 kg    Examination:  General exam: Appears comfortable  Respiratory system: clear breath sounds b/l  Cardiovascular system: S1/S2+. No rubs or clicks  Gastrointestinal system: Abd is soft, NT, obese & hypoactive bowel sounds  Central nervous system: alert & oriented. Moves all extremities Psychiatry: judgement and insight appears at baseline. Appropriate mood and affect    Data Reviewed: I have personally reviewed following labs and imaging studies  CBC: Recent Labs  Lab 07/30/23 1414 07/31/23 0454 08/01/23 0519  WBC 11.7* 13.6* 8.2  HGB 9.4* 9.1* 7.8*  HCT 29.6* 30.2* 24.7*  MCV 94.3 97.1 94.3  PLT 254 252 210   Basic Metabolic Panel: Recent Labs  Lab 07/30/23 1414 07/31/23 0454 08/01/23 0519  NA 142 139 136  K 3.7 3.7 4.2  CL 108 105 101  CO2 25 25 27   GLUCOSE 119* 132* 90  BUN 32* 31* 37*  CREATININE 1.68* 1.58* 2.35*  CALCIUM 8.6* 8.2* 8.0*   GFR: Estimated Creatinine Clearance: 41 mL/min (A) (by C-G formula based on SCr of 2.35 mg/dL (H)). Liver Function Tests: Recent Labs  Lab 07/30/23 1414  AST 13*  ALT 12  ALKPHOS 98  BILITOT 0.7  PROT 6.8  ALBUMIN  3.3*   Recent Labs  Lab 07/30/23 1414  LIPASE 46   No results for input(s): "AMMONIA" in the last 168 hours. Coagulation Profile: No results for input(s): "INR", "PROTIME" in the last 168 hours. Cardiac Enzymes: No results for input(s): "CKTOTAL", "CKMB", "CKMBINDEX", "TROPONINI" in the last 168 hours. BNP (last 3 results) No results for input(s): "PROBNP" in the last 8760 hours. HbA1C: No results for input(s): "HGBA1C" in the last 72 hours. CBG: Recent Labs  Lab 07/30/23 2219 07/31/23 0803 07/31/23 1139 07/31/23 1603 07/31/23 2053  GLUCAP 123* 108* 155* 173* 162*   Lipid Profile: No results for input(s): "CHOL", "HDL", "LDLCALC", "TRIG", "CHOLHDL", "LDLDIRECT"  in the last 72 hours. Thyroid Function Tests: No results for input(s): "TSH", "T4TOTAL", "FREET4", "T3FREE", "THYROIDAB" in the last 72 hours. Anemia Panel: No results for input(s): "VITAMINB12", "FOLATE", "FERRITIN", "TIBC", "IRON", "RETICCTPCT" in the last 72 hours. Sepsis Labs: No results for input(s): "PROCALCITON", "LATICACIDVEN" in the last 168 hours.  Recent Results (from the past 240 hour(s))  Culture, blood (Routine X 2) w Reflex to ID Panel     Status: None (Preliminary result)   Collection Time: 07/30/23  9:29 PM   Specimen: BLOOD LEFT ARM  Result Value Ref Range Status   Specimen Description BLOOD LEFT ARM  Final   Special Requests   Final    BOTTLES DRAWN AEROBIC AND ANAEROBIC Blood Culture adequate volume   Culture   Final    NO GROWTH 2 DAYS Performed at [facility], [address] [place], [place] [address]    Report Status PENDING  Incomplete  Culture, blood (Routine X 2) w Reflex to ID Panel     Status: None (Preliminary result)   Collection Time: 07/30/23  9:29 PM   Specimen: BLOOD LEFT HAND  Result Value Ref Range Status   Specimen Description BLOOD LEFT HAND  Final   Special Requests   Final    BOTTLES DRAWN AEROBIC AND ANAEROBIC Blood Culture adequate volume   Culture   Final    NO GROWTH 2 DAYS Performed at [facility], [address], [place], [place] [address]    Report Status PENDING  Incomplete         Radiology Studies: CT ABDOMEN PELVIS W CONTRAST  Result Date: 07/30/2023 CLINICAL DATA:  Abdominal pain EXAM: CT ABDOMEN AND PELVIS WITH CONTRAST TECHNIQUE: Multidetector CT imaging of the abdomen and pelvis was performed using the standard protocol following bolus administration of intravenous contrast. RADIATION DOSE REDUCTION: This exam was performed according to the departmental dose-optimization program which includes automated exposure control, adjustment of the mA and/or kV according to patient size and/or use of  iterative reconstruction technique. CONTRAST:  OMNIPAQUE IOHEXOL 300 MG/ML  SOLN COMPARISON:  02/14/2023 FINDINGS: Lower chest: Scarring/atelectasis in the right middle lobe and lingula, improved. Hepatobiliary: Liver is within normal limits. Gallbladder is notable for layering tiny gallstones (series 2/image 27), without associated inflammatory changes. No intrahepatic or extrahepatic duct dilatation. Pancreas: Within normal limits. Spleen: Calcified splenic granulomata. Adrenals/Urinary Tract: Adrenal glands are within normal limits. Kidneys are within normal limits. No renal, ureteral, or bladder calculi. No hydronephrosis. Bladder decompressed by an indwelling Foley catheter. Stomach/Bowel: Stomach is within normal limits. No evidence of bowel obstruction. Appendix is within normal limits (series 2/image 86). Moderate left colonic stool burden, with pericolonic stranding along the rectosigmoid colon (series 2/image 36), suggesting stercoral colitis. Vascular/Lymphatic: No evidence of abdominal aortic aneurysm. Atherosclerotic calcifications of the abdominal aorta and branch vessels,  although vessels remain patent. No suspicious abdominopelvic lymphadenopathy. Reproductive: Prostate is unremarkable. Other: Mild abdominal ascites. Musculoskeletal: Mild degenerative changes of the visualized thoracolumbar spine. Median sternotomy. IMPRESSION: Suspected stercoral colitis involving the rectosigmoid colon. Underlying constipation. Cholelithiasis, without associated inflammatory changes. Electronically Signed   By: Charline Bills M.D.   On: 07/30/2023 19:01        Scheduled Meds:  amLODipine  10 mg Oral Daily   aspirin EC  81 mg Oral Q breakfast   atorvastatin  40 mg Oral QHS   carvedilol  25 mg Oral BID WC   Chlorhexidine Gluconate Cloth  6 each Topical Daily   enoxaparin (LOVENOX) injection  40 mg Subcutaneous Q24H   ferrous gluconate  324 mg Oral Daily   gabapentin  300 mg Oral QHS   insulin  aspart  0-5 Units Subcutaneous QHS   insulin aspart  0-9 Units Subcutaneous TID WC   levothyroxine  75 mcg Oral QAC breakfast   mometasone-formoterol  2 puff Inhalation BID   pantoprazole  40 mg Oral Daily   polyethylene glycol  17 g Oral Daily   senna-docusate  1 tablet Oral BID   tamsulosin  0.8 mg Oral QPM   Continuous Infusions:  sodium chloride     ciprofloxacin Stopped (07/31/23 2133)   metronidazole Stopped (07/31/23 2318)     LOS: 1 day       Charise Killian, MD [facility] Pager [phone]  If 7PM-7AM, please contact night-coverage [email or web address] 08/01/2023, 7:53 AM

## 2023-08-02 ENCOUNTER — Inpatient Hospital Stay: Payer: Medicare Other

## 2023-08-02 DIAGNOSIS — K5289 Other specified noninfective gastroenteritis and colitis: Secondary | ICD-10-CM | POA: Diagnosis not present

## 2023-08-02 LAB — GLUCOSE, CAPILLARY
Glucose-Capillary: 107 mg/dL — ABNORMAL HIGH (ref 70–99)
Glucose-Capillary: 110 mg/dL — ABNORMAL HIGH (ref 70–99)
Glucose-Capillary: 144 mg/dL — ABNORMAL HIGH (ref 70–99)
Glucose-Capillary: 128 mg/dL — ABNORMAL HIGH (ref 70–99)

## 2023-08-02 LAB — CBC
HCT: 25.9 % — ABNORMAL LOW (ref 39.0–52.0)
Hemoglobin: 8.2 g/dL — ABNORMAL LOW (ref 13.0–17.0)
MCH: 29.3 pg (ref 26.0–34.0)
MCHC: 31.7 g/dL (ref 30.0–36.0)
MCV: 92.5 fL (ref 80.0–100.0)
Platelets: 199 10*3/uL (ref 150–400)
RBC: 2.8 MIL/uL — ABNORMAL LOW (ref 4.22–5.81)
RDW: 13.5 % (ref 11.5–15.5)
WBC: 8.7 10*3/uL (ref 4.0–10.5)
nRBC: 0 % (ref 0.0–0.2)

## 2023-08-02 LAB — BASIC METABOLIC PANEL
Anion gap: 10 (ref 5–15)
BUN: 42 mg/dL — ABNORMAL HIGH (ref 6–20)
CO2: 24 mmol/L (ref 22–32)
Calcium: 7.8 mg/dL — ABNORMAL LOW (ref 8.9–10.3)
Chloride: 102 mmol/L (ref 98–111)
Creatinine, Ser: 2.7 mg/dL — ABNORMAL HIGH (ref 0.61–1.24)
GFR, Estimated: 26 mL/min — ABNORMAL LOW (ref >60)
Glucose, Bld: 105 mg/dL — ABNORMAL HIGH (ref 70–99)
Potassium: 4.3 mmol/L (ref 3.5–5.1)
Sodium: 136 mmol/L (ref 135–145)

## 2023-08-02 MED ORDER — BISACODYL 10 MG RE SUPP
10.0000 mg | RECTAL | Status: AC
  Administered 2023-08-02: 10 mg via RECTAL
  Filled 2023-08-02: qty 1

## 2023-08-02 MED ORDER — LACTULOSE ENEMA
300.0000 mL | Freq: Once | ORAL | Status: DC
  Filled 2023-08-02: qty 300

## 2023-08-02 NOTE — Progress Notes (Signed)
PROGRESS NOTE    Shane Chambers  SWF:[id number] DOB: 18-Dec-1960 DOA: 07/30/2023 PCP: [name], MD   Assessment & Plan:   Principal Problem:   Stercoral colitis Active Problems:   Essential hypertension   COPD (chronic obstructive pulmonary disease) (HCC)   Hypothyroidism   HLD (hyperlipidemia)   Type II diabetes mellitus with renal manifestations (HCC)   Chronic diastolic CHF (congestive heart failure) (HCC)   Iron deficiency anemia   CKD stage 3a, GFR 45-59 ml/min (HCC)   BPH (benign prostatic hyperplasia)  Assessment and Plan: Stercoral colitis: still a lot stool still present as per XR abd. Lactulose enema ordered. Continue on miralax, senokot. D/c cipro, flagyl and monitor. Repeat XR abd tomorrow    HTN: continue on amlodipine, coreg. Holding torsemide. IV hydralazine prn    COPD: w/o excerebration. Bronchodilators    Hypothyroidism: continue on levothyroxine    HLD: continue on statin    DM2: HbA1c 6.1, well controlled. Continue on SSI w/ accuchecks    Chronic diastolic CHF:  echo 8/24 showed EF 60 to 65%. No leg edema. CHF compensated. Holding torsemide. Monitor I/Os   Iron deficiency anemia: continue on iron supplement    CKDIIa: baseline Cr 1.7. If Cr continues to rise, will consult nephro.    BPH: continue on flomax       DVT prophylaxis: lovenox  Code Status: full  Family Communication: Disposition Plan:likely d/c back to home facility   Level of care: Med-Surg Status is: Inpatient Remains inpatient appropriate because: severity of illness, still has significant stool burden enema ordered       Consultants:    Procedures:   Antimicrobials:   Subjective: Pt c/o constipation   Objective: Vitals:   08/01/23 1807 08/01/23 2035 08/02/23 0444 08/02/23 0736  BP:  138/64 (!) 132/59 132/62  Pulse: 62 62 60 (!) 57  Resp:  20 18 16   Temp:  98.9 F (37.2 C) 97.7 F (36.5 C) 98.8 F (37.1 C)  TempSrc:   Oral   SpO2:  97% 97% 93%   Weight:        Intake/Output Summary (Last 24 hours) at 08/02/2023 0838 Last data filed at 08/02/2023 0516 Gross per 24 hour  Intake 2576.7 ml  Output 1000 ml  Net 1576.7 ml   Filed Weights   08/01/23 0107  Weight: 107.4 kg    Examination:  General exam: Appears uncomfortable   Respiratory system: clear breath sounds b/l Cardiovascular system: S1 & S2+. No rubs or clicks  Gastrointestinal system: Abd is soft, NT,obese & hypoactive bowel sounds  Central nervous system: alert & awake.  Psychiatry: judgement and insight appears at baseline. Flat mood and affect    Data Reviewed: I have personally reviewed following labs and imaging studies  CBC: Recent Labs  Lab 07/30/23 1414 07/31/23 0454 08/01/23 0519 08/02/23 0453  WBC 11.7* 13.6* 8.2 8.7  HGB 9.4* 9.1* 7.8* 8.2*  HCT 29.6* 30.2* 24.7* 25.9*  MCV 94.3 97.1 94.3 92.5  PLT 254 252 210 199   Basic Metabolic Panel: Recent Labs  Lab 07/30/23 1414 07/31/23 0454 08/01/23 0519 08/02/23 0453  NA 142 139 136 136  K 3.7 3.7 4.2 4.3  CL 108 105 101 102  CO2 25 25 27 24   GLUCOSE 119* 132* 90 105*  BUN 32* 31* 37* 42*  CREATININE 1.68* 1.58* 2.35* 2.70*  CALCIUM 8.6* 8.2* 8.0* 7.8*   GFR: Estimated Creatinine Clearance: 35.7 mL/min (A) (by C-G formula based on SCr of 2.7 mg/dL (  H)). Liver Function Tests: Recent Labs  Lab 07/30/23 1414  AST 13*  ALT 12  ALKPHOS 98  BILITOT 0.7  PROT 6.8  ALBUMIN 3.3*   Recent Labs  Lab 07/30/23 1414  LIPASE 46   No results for input(s): "AMMONIA" in the last 168 hours. Coagulation Profile: No results for input(s): "INR", "PROTIME" in the last 168 hours. Cardiac Enzymes: No results for input(s): "CKTOTAL", "CKMB", "CKMBINDEX", "TROPONINI" in the last 168 hours. BNP (last 3 results) No results for input(s): "PROBNP" in the last 8760 hours. HbA1C: No results for input(s): "HGBA1C" in the last 72 hours. CBG: Recent Labs  Lab 08/01/23 0811 08/01/23 1155  08/01/23 1604 08/01/23 2038 08/02/23 0737  GLUCAP 103* 103* 117* 128* 107*   Lipid Profile: No results for input(s): "CHOL", "HDL", "LDLCALC", "TRIG", "CHOLHDL", "LDLDIRECT" in the last 72 hours. Thyroid Function Tests: No results for input(s): "TSH", "T4TOTAL", "FREET4", "T3FREE", "THYROIDAB" in the last 72 hours. Anemia Panel: No results for input(s): "VITAMINB12", "FOLATE", "FERRITIN", "TIBC", "IRON", "RETICCTPCT" in the last 72 hours. Sepsis Labs: No results for input(s): "PROCALCITON", "LATICACIDVEN" in the last 168 hours.  Recent Results (from the past 240 hour(s))  Culture, blood (Routine X 2) w Reflex to ID Panel     Status: None (Preliminary result)   Collection Time: 07/30/23  9:29 PM   Specimen: BLOOD LEFT ARM  Result Value Ref Range Status   Specimen Description BLOOD LEFT ARM  Final   Special Requests   Final    BOTTLES DRAWN AEROBIC AND ANAEROBIC Blood Culture adequate volume   Culture   Final    NO GROWTH 3 DAYS Performed at [facility], [address] [place], [place] [address]    Report Status PENDING  Incomplete  Culture, blood (Routine X 2) w Reflex to ID Panel     Status: None (Preliminary result)   Collection Time: 07/30/23  9:29 PM   Specimen: BLOOD LEFT HAND  Result Value Ref Range Status   Specimen Description BLOOD LEFT HAND  Final   Special Requests   Final    BOTTLES DRAWN AEROBIC AND ANAEROBIC Blood Culture adequate volume   Culture   Final    NO GROWTH 3 DAYS Performed at [facility], [address] [place], [place] [address]    Report Status PENDING  Incomplete         Radiology Studies: No results found.      Scheduled Meds:  amLODipine  10 mg Oral Daily   aspirin EC  81 mg Oral Q breakfast   atorvastatin  40 mg Oral QHS   carvedilol  25 mg Oral BID WC   Chlorhexidine Gluconate Cloth  6 each Topical Daily   enoxaparin (LOVENOX) injection  40 mg Subcutaneous Q24H   feeding supplement (GLUCERNA SHAKE)   237 mL Oral TID BM   ferrous gluconate  324 mg Oral Daily   gabapentin  300 mg Oral QHS   insulin aspart  0-5 Units Subcutaneous QHS   insulin aspart  0-9 Units Subcutaneous TID WC   lactulose  20 g Oral BID   levothyroxine  75 mcg Oral QAC breakfast   mometasone-formoterol  2 puff Inhalation BID   pantoprazole  40 mg Oral Daily   polyethylene glycol  17 g Oral Daily   senna-docusate  1 tablet Oral BID   tamsulosin  0.8 mg Oral QPM   Continuous Infusions:  sodium chloride Stopped (08/01/23 0937)   ciprofloxacin Stopped (08/01/23 2148)  metronidazole Stopped (08/01/23 2138)     LOS: 2 days       Charise Killian, MD [facility] Pager [phone]  If 7PM-7AM, please contact night-coverage [email or web address] 08/02/2023, 8:38 AM

## 2023-08-02 NOTE — Plan of Care (Signed)
  Problem: Education: Goal: Knowledge of the prescribed therapeutic regimen will improve Outcome: Progressing Goal: Ability to verbalize activity precautions or restrictions will improve Outcome: Progressing Goal: Understanding of discharge needs will improve Outcome: Progressing   Problem: Activity: Goal: Ability to perform//tolerate increased activity and mobilize with assistive devices will improve Outcome: Progressing   Problem: Clinical Measurements: Goal: Postoperative complications will be avoided or minimized Outcome: Progressing   Problem: Self-Care: Goal: Ability to meet self-care needs will improve Outcome: Progressing   Problem: Self-Concept: Goal: Ability to maintain and perform role responsibilities to the fullest extent possible will improve Outcome: Progressing   Problem: Pain Management: Goal: Pain level will decrease with appropriate interventions Outcome: Progressing   Problem: Skin Integrity: Goal: Demonstration of wound healing without infection will improve Outcome: Progressing   Problem: Education: Goal: Ability to describe self-care measures that may prevent or decrease complications (Diabetes Survival Skills Education) will improve Outcome: Progressing Goal: Individualized Educational Video(s) Outcome: Progressing   Problem: Coping: Goal: Ability to adjust to condition or change in health will improve Outcome: Progressing   Problem: Fluid Volume: Goal: Ability to maintain a balanced intake and output will improve Outcome: Progressing   Problem: Health Behavior/Discharge Planning: Goal: Ability to identify and utilize available resources and services will improve Outcome: Progressing Goal: Ability to manage health-related needs will improve Outcome: Progressing   Problem: Metabolic: Goal: Ability to maintain appropriate glucose levels will improve Outcome: Progressing   Problem: Nutritional: Goal: Maintenance of adequate nutrition will  improve Outcome: Progressing Goal: Progress toward achieving an optimal weight will improve Outcome: Progressing   Problem: Skin Integrity: Goal: Risk for impaired skin integrity will decrease Outcome: Progressing   Problem: Tissue Perfusion: Goal: Adequacy of tissue perfusion will improve Outcome: Progressing   Problem: Education: Goal: Knowledge of General Education information will improve Description: Including pain rating scale, medication(s)/side effects and non-pharmacologic comfort measures Outcome: Progressing   Problem: Health Behavior/Discharge Planning: Goal: Ability to manage health-related needs will improve Outcome: Progressing   Problem: Clinical Measurements: Goal: Ability to maintain clinical measurements within normal limits will improve Outcome: Progressing Goal: Will remain free from infection Outcome: Progressing Goal: Diagnostic test results will improve Outcome: Progressing Goal: Respiratory complications will improve Outcome: Progressing Goal: Cardiovascular complication will be avoided Outcome: Progressing   Problem: Activity: Goal: Risk for activity intolerance will decrease Outcome: Progressing   Problem: Nutrition: Goal: Adequate nutrition will be maintained Outcome: Progressing   Problem: Coping: Goal: Level of anxiety will decrease Outcome: Progressing   Problem: Elimination: Goal: Will not experience complications related to bowel motility Outcome: Progressing Goal: Will not experience complications related to urinary retention Outcome: Progressing   Problem: Pain Managment: Goal: General experience of comfort will improve Outcome: Progressing   Problem: Safety: Goal: Ability to remain free from injury will improve Outcome: Progressing   Problem: Skin Integrity: Goal: Risk for impaired skin integrity will decrease Outcome: Progressing   

## 2023-08-02 NOTE — Plan of Care (Signed)
Patient without distress or complaints. Slept very little tonight. All needs attended to. Will continue to monitor.   Problem: Education: Goal: Knowledge of the prescribed therapeutic regimen will improve Outcome: Progressing Goal: Ability to verbalize activity precautions or restrictions will improve Outcome: Progressing Goal: Understanding of discharge needs will improve Outcome: Progressing   Problem: Activity: Goal: Ability to perform//tolerate increased activity and mobilize with assistive devices will improve Outcome: Progressing   Problem: Clinical Measurements: Goal: Postoperative complications will be avoided or minimized Outcome: Progressing   Problem: Self-Care: Goal: Ability to meet self-care needs will improve Outcome: Progressing   Problem: Self-Concept: Goal: Ability to maintain and perform role responsibilities to the fullest extent possible will improve Outcome: Progressing   Problem: Pain Management: Goal: Pain level will decrease with appropriate interventions Outcome: Progressing   Problem: Skin Integrity: Goal: Demonstration of wound healing without infection will improve Outcome: Progressing   Problem: Education: Goal: Ability to describe self-care measures that may prevent or decrease complications (Diabetes Survival Skills Education) will improve Outcome: Progressing Goal: Individualized Educational Video(s) Outcome: Progressing   Problem: Coping: Goal: Ability to adjust to condition or change in health will improve Outcome: Progressing   Problem: Fluid Volume: Goal: Ability to maintain a balanced intake and output will improve Outcome: Progressing   Problem: Health Behavior/Discharge Planning: Goal: Ability to identify and utilize available resources and services will improve Outcome: Progressing Goal: Ability to manage health-related needs will improve Outcome: Progressing   Problem: Metabolic: Goal: Ability to maintain appropriate  glucose levels will improve Outcome: Progressing   Problem: Nutritional: Goal: Maintenance of adequate nutrition will improve Outcome: Progressing Goal: Progress toward achieving an optimal weight will improve Outcome: Progressing   Problem: Skin Integrity: Goal: Risk for impaired skin integrity will decrease Outcome: Progressing   Problem: Tissue Perfusion: Goal: Adequacy of tissue perfusion will improve Outcome: Progressing   Problem: Education: Goal: Knowledge of General Education information will improve Description: Including pain rating scale, medication(s)/side effects and non-pharmacologic comfort measures Outcome: Progressing   Problem: Health Behavior/Discharge Planning: Goal: Ability to manage health-related needs will improve Outcome: Progressing   Problem: Clinical Measurements: Goal: Ability to maintain clinical measurements within normal limits will improve Outcome: Progressing Goal: Will remain free from infection Outcome: Progressing Goal: Diagnostic test results will improve Outcome: Progressing Goal: Respiratory complications will improve Outcome: Progressing Goal: Cardiovascular complication will be avoided Outcome: Progressing   Problem: Activity: Goal: Risk for activity intolerance will decrease Outcome: Progressing   Problem: Nutrition: Goal: Adequate nutrition will be maintained Outcome: Progressing   Problem: Coping: Goal: Level of anxiety will decrease Outcome: Progressing   Problem: Elimination: Goal: Will not experience complications related to bowel motility Outcome: Progressing Goal: Will not experience complications related to urinary retention Outcome: Progressing   Problem: Pain Managment: Goal: General experience of comfort will improve Outcome: Progressing   Problem: Safety: Goal: Ability to remain free from injury will improve Outcome: Progressing   Problem: Skin Integrity: Goal: Risk for impaired skin integrity will  decrease Outcome: Progressing

## 2023-08-03 ENCOUNTER — Inpatient Hospital Stay: Payer: Medicare Other

## 2023-08-03 DIAGNOSIS — K5289 Other specified noninfective gastroenteritis and colitis: Secondary | ICD-10-CM | POA: Diagnosis not present

## 2023-08-03 LAB — BASIC METABOLIC PANEL
Anion gap: 9 (ref 5–15)
BUN: 47 mg/dL — ABNORMAL HIGH (ref 6–20)
CO2: 24 mmol/L (ref 22–32)
Calcium: 8.3 mg/dL — ABNORMAL LOW (ref 8.9–10.3)
Chloride: 103 mmol/L (ref 98–111)
Creatinine, Ser: 2.37 mg/dL — ABNORMAL HIGH (ref 0.61–1.24)
GFR, Estimated: 31 mL/min — ABNORMAL LOW (ref >60)
Glucose, Bld: 101 mg/dL — ABNORMAL HIGH (ref 70–99)
Potassium: 4.1 mmol/L (ref 3.5–5.1)
Sodium: 136 mmol/L (ref 135–145)

## 2023-08-03 LAB — CBC
HCT: 25.7 % — ABNORMAL LOW (ref 39.0–52.0)
Hemoglobin: 8.4 g/dL — ABNORMAL LOW (ref 13.0–17.0)
MCH: 30 pg (ref 26.0–34.0)
MCHC: 32.7 g/dL (ref 30.0–36.0)
MCV: 91.8 fL (ref 80.0–100.0)
Platelets: 198 10*3/uL (ref 150–400)
RBC: 2.8 MIL/uL — ABNORMAL LOW (ref 4.22–5.81)
RDW: 13.2 % (ref 11.5–15.5)
WBC: 9.3 10*3/uL (ref 4.0–10.5)
nRBC: 0 % (ref 0.0–0.2)

## 2023-08-03 LAB — GLUCOSE, CAPILLARY
Glucose-Capillary: 103 mg/dL — ABNORMAL HIGH (ref 70–99)
Glucose-Capillary: 134 mg/dL — ABNORMAL HIGH (ref 70–99)

## 2023-08-03 NOTE — [place] FL2 (Signed)
[place] MEDICAID FL2 LEVEL OF CARE FORM     IDENTIFICATION  Patient Name: Shane Chambers Birthdate: 1961-01-22 Sex: male Admission Date (Current Location): 07/30/2023  [facility] and [place] Number:      Facility and Address:  [facility], 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   HLD (hyperlipidemia) 07/30/2023   Type II diabetes mellitus with renal manifestations (HCC) 07/30/2023   Chronic diastolic CHF (congestive heart failure) (HCC) 07/30/2023   Acute on chronic heart failure with preserved ejection fraction (HFpEF) (HCC) 03/27/2023   COPD with acute exacerbation (HCC) 03/27/2023   Dyslipidemia 03/27/2023   Type 2 diabetes mellitus without complications (HCC) 03/27/2023   Hypothyroidism 03/27/2023   CKD stage 3a, GFR 45-59 ml/min (HCC) 03/27/2023   Moderate aortic stenosis 03/27/2023   Acute urinary retention 03/27/2023   Hyperkalemia 02/15/2023   Pulmonary edema 02/15/2023   Bilateral pleural effusion 02/15/2023   Nausea & vomiting 02/15/2023   Chronic diarrhea 02/15/2023   Obesity (BMI 30-39.9) 02/15/2023   High anion gap metabolic acidosis 02/14/2023   S/P BKA (below knee amputation) unilateral, right (HCC) 12/17/2022   MRSA infection 11/19/2022   Amputation stump infection (HCC) 11/19/2022   QT prolongation 11/10/2022   Severe recurrent major depression without psychotic features (HCC) 11/05/2022   Major depressive disorder with psychotic features (HCC) 11/02/2022   Acute hypoactive  delirium due to another medical condition 10/13/2022   Septic arthritis of right ankle (HCC) 10/04/2022   Acute osteomyelitis (HCC) 10/02/2022   Subacute osteomyelitis, right ankle and foot (HCC) 10/02/2022   Acquired hypothyroidism 10/02/2022   Hypoalbuminemia due to protein-calorie malnutrition (HCC) 10/02/2022   Cellulitis of right lower extremity 09/25/2022   Lactic acidosis 04/23/2022   Anemia of chronic disease 04/23/2022   Morbid obesity (HCC) 04/23/2022   AKI (acute kidney injury) (HCC) 04/22/2022   Elevated troponin 04/22/2022   Cancer of sigmoid colon (HCC) 12/16/2021   Constipation 12/16/2021   GERD without esophagitis 12/16/2021   Iron deficiency anemia 09/17/2021   Rectal cancer (HCC) 09/03/2021   Bilateral lower leg cellulitis 11/22/2020   Systolic and diastolic CHF, chronic (HCC) 11/22/2020   CHF exacerbation (HCC) 08/01/2020   Visit for wound check 05/28/2020   OSA (obstructive sleep apnea) 05/24/2020   COPD (chronic obstructive pulmonary disease) (HCC) 04/29/2020   Leukocytosis 04/29/2020   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   Ischemic cardiomyopathy    Coronary artery disease    Acute on chronic combined systolic and diastolic CHF (congestive heart failure) (HCC) 03/05/2020   Acute hyponatremia 03/05/2020   Acute respiratory failure with hypoxia (HCC) 12/21/2019   Severe Vitamin D deficiency 12/02/2019   Hypokalemia 12/01/2019   Mixed hyperlipidemia 12/01/2019   BPH (benign prostatic hyperplasia) 12/01/2019   Normocytic anemia 12/01/2019   Pneumonia due to COVID-19 virus 12/01/2019   Hypoxia    SOB (shortness of breath) 11/16/2019   Severe sepsis (HCC) 11/16/2019   Essential hypertension    Uncontrolled type 2 diabetes mellitus with hypoglycemia, with long-term current use of  insulin (HCC)    Depression    Acute bronchitis with bronchospasm 11/15/2019    Orientation RESPIRATION BLADDER Height & Weight     Self, Time, Situation, Place  Normal Incontinent Weight:  247 lb 12.8 oz (112.4 kg) Height:     BEHAVIORAL SYMPTOMS/MOOD NEUROLOGICAL BOWEL NUTRITION STATUS      Incontinent    AMBULATORY STATUS COMMUNICATION OF NEEDS Skin   Extensive Assist Verbally Normal                       Personal Care Assistance Level of Assistance  Bathing, Dressing, Feeding Bathing Assistance: Maximum assistance Feeding assistance: Limited assistance Dressing Assistance: Maximum assistance     Functional Limitations Info             SPECIAL CARE FACTORS FREQUENCY                       Contractures Contractures Info: Not present    Additional Factors Info  Code Status, Allergies, Isolation Precautions Code Status Info: FULL Allergies Info: Ace Inhibitors  Haloperidol And Related  Other  Shellfish Allergy  Penicillins     Isolation Precautions Info: MRSA     Current Medications (08/03/2023):  This is the current hospital active medication list Current Facility-Administered Medications  Medication Dose Route Frequency Provider Last Rate Last Admin   0.9 %  sodium chloride infusion   Intravenous PRN Charise Killian, MD   Stopped at 08/01/23 [phone]   acetaminophen (TYLENOL) tablet 650 mg  650 mg Oral Q6H PRN Lorretta Harp, MD       amLODipine (NORVASC) tablet 10 mg  10 mg Oral Daily Lorretta Harp, MD   10 mg at 08/03/23 9604   aspirin EC tablet 81 mg  81 mg Oral Q breakfast Lorretta Harp, MD   81 mg at 08/03/23 0953   atorvastatin (LIPITOR) tablet 40 mg  40 mg Oral QHS Lorretta Harp, MD   40 mg at 08/02/23 2104   carvedilol (COREG) tablet 25 mg  25 mg Oral BID WC Lorretta Harp, MD   25 mg at 08/02/23 1736   Chlorhexidine Gluconate Cloth 2 % PADS 6 each  6 each Topical Daily Charise Killian, MD   6 each at 08/03/23 1141   dextromethorphan-guaiFENesin (MUCINEX DM) 30-600 MG per 12 hr tablet 1 tablet  1 tablet Oral BID PRN Lorretta Harp, MD       enoxaparin (LOVENOX) injection 40 mg  40 mg Subcutaneous Q24H Lorretta Harp, MD   40 mg at 08/02/23 2106   feeding  supplement (GLUCERNA SHAKE) (GLUCERNA SHAKE) liquid 237 mL  237 mL Oral TID BM Charise Killian, MD   237 mL at 08/03/23 0908   ferrous gluconate (FERGON) tablet 324 mg  324 mg Oral Daily Lorretta Harp, MD   324 mg at 08/03/23 0908   gabapentin (NEURONTIN) capsule 300 mg  300 mg Oral QHS Lorretta Harp, MD   300 mg at 08/02/23 2105   hydrALAZINE (APRESOLINE) injection 5 mg  5 mg Intravenous Q2H PRN Lorretta Harp, MD       insulin aspart (novoLOG) injection 0-5 Units  0-5 Units Subcutaneous QHS Lorretta Harp, MD       insulin aspart (novoLOG) injection 0-9 Units  0-9 Units Subcutaneous TID WC Lorretta Harp, MD   1 Units at 08/03/23 1228   ipratropium-albuterol (DUONEB) 0.5-2.5 (3) MG/3ML nebulizer solution 3 mL  3 mL Nebulization Q6H PRN Lorretta Harp, MD  lactulose (CHRONULAC) 10 GM/15ML solution 20 g  20 g Oral BID Charise Killian, MD   20 g at 08/03/23 0908   lactulose (CHRONULAC) enema 200 gm  300 mL Rectal Once Charise Killian, MD       levothyroxine (SYNTHROID) tablet 75 mcg  75 mcg Oral QAC breakfast Lorretta Harp, MD   75 mcg at 08/03/23 0517   mometasone-formoterol (DULERA) 200-5 MCG/ACT inhaler 2 puff  2 puff Inhalation BID Lorretta Harp, MD   2 puff at 08/03/23 0908   morphine (PF) 2 MG/ML injection 2 mg  2 mg Intravenous Q4H PRN Lorretta Harp, MD   2 mg at 08/02/23 0940   ondansetron (ZOFRAN) injection 4 mg  4 mg Intravenous Q8H PRN Lorretta Harp, MD   4 mg at 07/31/23 1610   oxyCODONE-acetaminophen (PERCOCET/ROXICET) 5-325 MG per tablet 1 tablet  1 tablet Oral Q4H PRN Lorretta Harp, MD   1 tablet at 08/02/23 1149   pantoprazole (PROTONIX) EC tablet 40 mg  40 mg Oral Daily Lorretta Harp, MD   40 mg at 08/03/23 0908   polyethylene glycol (MIRALAX / GLYCOLAX) packet 17 g  17 g Oral Daily Lorretta Harp, MD   17 g at 08/03/23 0910   senna-docusate (Senokot-S) tablet 1 tablet  1 tablet Oral BID Lorretta Harp, MD   1 tablet at 08/03/23 0908   tamsulosin (FLOMAX) capsule 0.8 mg  0.8 mg Oral QPM Lorretta Harp, MD   0.8 mg at  08/02/23 1736     Discharge Medications: Please see discharge summary for a list of discharge medications.    STOP taking these medications     Glucerna Liqd    venlafaxine XR 75 MG 24 hr capsule Commonly known as: EFFEXOR-XR           TAKE these medications     acetaminophen 500 MG tablet Commonly known as: TYLENOL Take 1,000 mg by mouth every 8 (eight) hours as needed for fever or moderate pain.    albuterol 108 (90 Base) MCG/ACT inhaler Commonly known as: VENTOLIN HFA Inhale 2 puffs into the lungs every 6 (six) hours as needed for wheezing or shortness of breath.    amLODipine 10 MG tablet Commonly known as: NORVASC Take 10 mg by mouth daily.    aspirin EC 81 MG tablet Take 1 tablet (81 mg total) by mouth daily with breakfast.    atorvastatin 40 MG tablet Commonly known as: LIPITOR Take 1 tablet (40 mg total) by mouth at bedtime.    Auvelity 45-105 MG Tbcr Generic drug: Dextromethorphan-buPROPion ER Take 1 tablet by mouth 2 (two) times daily.    bisacodyl 5 MG EC tablet Commonly known as: bisacodyl Take 1 tablet (5 mg total) by mouth daily as needed for moderate constipation.    budesonide-formoterol 160-4.5 MCG/ACT inhaler Commonly known as: Symbicort Inhale 2 puffs into the lungs in the morning and at bedtime.    carvedilol 25 MG tablet Commonly known as: COREG Take 1 tablet (25 mg total) by mouth 2 (two) times daily with a meal.    ferrous gluconate 324 MG tablet Commonly known as: FERGON Take 324 mg by mouth daily.    gabapentin 600 MG tablet Commonly known as: NEURONTIN Take 600 mg by mouth at bedtime.    hydrALAZINE 100 MG tablet Commonly known as: APRESOLINE Take 1 tablet (100 mg total) by mouth every 8 (eight) hours.    ipratropium-albuterol 0.5-2.5 (3) MG/3ML Soln Commonly known as: DUONEB Take 3 mLs by  nebulization every 6 (six) hours as needed. What changed: reasons to take this    lactulose 10 GM/15ML solution Commonly known as:  CHRONULAC Take 20 g by mouth daily as needed for moderate constipation.    levothyroxine 75 MCG tablet Commonly known as: SYNTHROID Take 75 mcg by mouth daily before breakfast.    linaclotide 145 MCG Caps capsule Commonly known as: Linzess Take 1 capsule (145 mcg total) by mouth daily before breakfast.    mirtazapine 45 MG tablet Commonly known as: REMERON Take 45 mg by mouth at bedtime.    multivitamin tablet Take 1 tablet by mouth daily.    NovoLOG FlexPen 100 UNIT/ML FlexPen Generic drug: insulin aspart 0-15 Units, Subcutaneous, 3 times daily with meals CBG < 70: implement hypoglycemia protocol-call MD CBG 70 - 120: 0 units CBG 121 - 150: 2 units CBG 151 - 200: 3 units CBG 201 - 250: 5 units CBG 251 - 300: 8 units CBG 301 - 350: 11 units CBG 351 - 400: 15 units CBG > 400:    omeprazole 40 MG capsule Commonly known as: PRILOSEC Take 40 mg by mouth in the morning.    polyethylene glycol 17 g packet Commonly known as: MIRALAX / GLYCOLAX Take 17 g by mouth daily as needed for moderate constipation.    polyvinyl alcohol 1.4 % ophthalmic solution Commonly known as: LIQUIFILM TEARS Place 1 drop into both eyes daily.    tamsulosin 0.4 MG Caps capsule Commonly known as: FLOMAX Take 0.8 mg by mouth every evening.    thiamine 100 MG tablet Commonly known as: Vitamin B-1 Take 1 tablet (100 mg total) by mouth daily.    torsemide 20 MG tablet Commonly known as: DEMADEX Take 1 tablet (20 mg total) by mouth daily.    Vitamin D (Ergocalciferol) 1.25 MG (50000 UNIT) Caps capsule Commonly known as: DRISDOL Take 50,000 Units by mouth every 7 (seven) days. (Mondays)        Relevant Imaging Results:  Relevant Lab Results:   Additional Information SSN: [id number]  Allena Katz, LCSW

## 2023-08-03 NOTE — Progress Notes (Addendum)
Patient being discharged to [facility]. Called report and spoke to [place], nurse. All discharge paper work is in discharge packet. Patient will also be leaving with chronic foley. Patient will be transported via EMS.

## 2023-08-03 NOTE — TOC Transition Note (Addendum)
Transition of Care [facility]) - CM/SW Discharge Note   Patient Details  Name: Shane Chambers MRN: [id number] Date of Birth: May 29, 1961  [facility]) CM/SW Contact:  Allena Katz, LCSW Phone Number: 08/03/2023, 12:59 PM   Clinical Narrative:   Pt has orders to discharge to [facility]. Revonda Standard with [name] notified. DC summary and fl2 to be sent once in. RN given number for report. CSW to call [facility] to transport patient. Medical Necessity printed to unit.     Final next level of care: Skilled Nursing Facility Barriers to Discharge: Barriers Resolved   Patient Goals and CMS Choice CMS Medicare.gov Compare Post Acute Care list provided to:: Patient    Discharge Placement                Patient chooses bed at: [facility] Patient to be transferred to facility by: [facility]      Discharge Plan and Services Additional resources added to the After Visit Summary for                                       Social Determinants of Health (SDOH) Interventions SDOH Screenings   Food Insecurity: No Food Insecurity (07/31/2023)  Housing: Low Risk  (07/31/2023)  Transportation Needs: No Transportation Needs (07/31/2023)  Utilities: Not At Risk (07/31/2023)  Alcohol Screen: Low Risk  (11/05/2022)  Tobacco Use: Medium Risk (07/30/2023)     Readmission Risk Interventions    02/19/2023    1:52 PM 02/15/2023   11:15 AM 11/11/2022    2:05 PM  Readmission Risk Prevention Plan  Transportation Screening Complete Complete   Medication Review (RN Care Manager) Complete Complete   PCP or Specialist appointment within 3-5 days of discharge Complete    HRI or Home Care Consult Complete Complete   SW Recovery Care/Counseling Consult Complete Complete   Palliative Care Screening Not Applicable Not Applicable   Skilled Nursing Facility Complete Complete      Information is confidential and restricted. Go to Review Flowsheets to unlock data.

## 2023-08-03 NOTE — Progress Notes (Signed)
Introduced patient to role of Statistician. Patient noted to have very flat affect. Patient resides at [facility]. Transport to/from appointments is done by the facility. Patient denies any current SDOH needs.

## 2023-08-03 NOTE — Discharge Summary (Signed)
Physician Discharge Summary  Shane Chambers:[id number] DOB: [birth year] DOA: 07/30/2023  PCP: [name], MD  Admit date: 07/30/2023 Discharge date: 08/03/2023  Admitted From: home facility  Disposition:  home facility   Recommendations for Outpatient Follow-up:  Follow up with PCP in 1-2 weeks  Home Health: no  Equipment/Devices:  Discharge Condition: stable  CODE STATUS: full  Diet recommendation: Heart Healthy / Carb Modified  Brief/Interim Summary: HPI was taken from Dr. Clyde Lundborg: Shane Chambers is a 62 y.o. male with medical history significant of hypertension, hyperlipidemia, diabetes mellitus, COPD, CAD, diastolic CHF, hypothyroidism, depression, BPH, CKD-3A, s/p of right BKA, rectal cancer 2022 (s/p of surgery, no chemo or radiation therapy), who presents with abdominal pain   Patient states that he has abdominal pain for more than 1 week.  His abdominal pain is constant, diffuse, aching, moderate, nonradiating.  Associated with abdominal distention.  Not aggravated or alleviated by any known factors.  Patient has been constipated.  He has nausea, no vomiting, diarrhea or abdominal pain.  No fever or chills.  Denies chest pain, cough, shortness of breath.  No symptoms of UTI.   Data reviewed independently and ED Course: pt was found to have WBC 11.7, stable renal function, temperature normal, blood pressure 140/62, heart rate 62, RR 27, oxygen saturation 96% on room air.  Patient is placed on MedSurg bed for observation.    Discharge Diagnoses:  Principal Problem:   Stercoral colitis Active Problems:   Essential hypertension   COPD (chronic obstructive pulmonary disease) (HCC)   Hypothyroidism   HLD (hyperlipidemia)   Type II diabetes mellitus with renal manifestations (HCC)   Chronic diastolic CHF (congestive heart failure) (HCC)   Iron deficiency anemia   CKD stage 3a, GFR 45-59 ml/min (HCC)   BPH (benign prostatic hyperplasia) Stercoral colitis: had several BMs,  resolved. Continue on bowel regimen    HTN: continue on amlodipine, coreg, torsemide.   COPD: w/o excerebration. Bronchodilators    Hypothyroidism: continue on levothyroxine    HLD: continue on statin    DM2: HbA1c 6.1, well controlled. Continue on anti-DM meds at d/c   Chronic diastolic CHF:  echo 8/24 showed EF 60 to 65%. No leg edema. CHF compensated. Restart torsemide and continue on coreg. Monitor I/Os   Iron deficiency anemia: continue on iron supplement    CKDIIa: baseline Cr 1.7. Cr is trending down today    BPH: continue on flomax     Discharge Instructions  Discharge Instructions     Diet - low sodium heart healthy   Complete by: As directed    Diet Carb Modified   Complete by: As directed    Discharge instructions   Complete by: As directed    F/u w/ PCP in 1-2 weeks   Increase activity slowly   Complete by: As directed       Allergies as of 08/03/2023       Reactions   Ace Inhibitors Swelling, Cough   (Not on MAR at [name])   Haloperidol And Related    Do NOT give anti-psychotics due to risk of  Torsades and QT prolongation (per Dr. Teena Irani request)   Other Itching   [name]   Shellfish Allergy    Listed on MAR   Penicillins Itching, Rash   Has patient had a PCN reaction causing immediate rash, facial/tongue/throat swelling, SOB or lightheadedness with hypotension: Unknown Has patient had a PCN reaction causing severe rash involving mucus membranes  or skin necrosis: Unknown Has patient had a PCN reaction that required hospitalization: Unknown Has patient had a PCN reaction occurring within the last 10 years: Unknown If all of the above answers are "NO", then may proceed with Cephalosporin use. Tolerated ancef (12-17-22)        Medication List     STOP taking these medications    Glucerna Liqd   venlafaxine XR 75 MG 24 hr capsule Commonly known as: EFFEXOR-XR       TAKE these medications     acetaminophen 500 MG tablet Commonly known as: TYLENOL Take 1,000 mg by mouth every 8 (eight) hours as needed for fever or moderate pain.   albuterol 108 (90 Base) MCG/ACT inhaler Commonly known as: VENTOLIN HFA Inhale 2 puffs into the lungs every 6 (six) hours as needed for wheezing or shortness of breath.   amLODipine 10 MG tablet Commonly known as: NORVASC Take 10 mg by mouth daily.   aspirin EC 81 MG tablet Take 1 tablet (81 mg total) by mouth daily with breakfast.   atorvastatin 40 MG tablet Commonly known as: LIPITOR Take 1 tablet (40 mg total) by mouth at bedtime.   Auvelity 45-105 MG Tbcr Generic drug: Dextromethorphan-buPROPion ER Take 1 tablet by mouth 2 (two) times daily.   bisacodyl 5 MG EC tablet Commonly known as: bisacodyl Take 1 tablet (5 mg total) by mouth daily as needed for moderate constipation.   budesonide-formoterol 160-4.5 MCG/ACT inhaler Commonly known as: Symbicort Inhale 2 puffs into the lungs in the morning and at bedtime.   carvedilol 25 MG tablet Commonly known as: COREG Take 1 tablet (25 mg total) by mouth 2 (two) times daily with a meal.   ferrous gluconate 324 MG tablet Commonly known as: FERGON Take 324 mg by mouth daily.   gabapentin 600 MG tablet Commonly known as: NEURONTIN Take 600 mg by mouth at bedtime.   hydrALAZINE 100 MG tablet Commonly known as: APRESOLINE Take 1 tablet (100 mg total) by mouth every 8 (eight) hours.   ipratropium-albuterol 0.5-2.5 (3) MG/3ML Soln Commonly known as: DUONEB Take 3 mLs by nebulization every 6 (six) hours as needed. What changed: reasons to take this   lactulose 10 GM/15ML solution Commonly known as: CHRONULAC Take 20 g by mouth daily as needed for moderate constipation.   levothyroxine 75 MCG tablet Commonly known as: SYNTHROID Take 75 mcg by mouth daily before breakfast.   linaclotide 145 MCG Caps capsule Commonly known as: Linzess Take 1 capsule (145 mcg total) by mouth daily  before breakfast.   mirtazapine 45 MG tablet Commonly known as: REMERON Take 45 mg by mouth at bedtime.   multivitamin tablet Take 1 tablet by mouth daily.   NovoLOG FlexPen 100 UNIT/ML FlexPen Generic drug: insulin aspart 0-15 Units, Subcutaneous, 3 times daily with meals CBG < 70: implement hypoglycemia protocol-call MD CBG 70 - 120: 0 units CBG 121 - 150: 2 units CBG 151 - 200: 3 units CBG 201 - 250: 5 units CBG 251 - 300: 8 units CBG 301 - 350: 11 units CBG 351 - 400: 15 units CBG > 400:   omeprazole 40 MG capsule Commonly known as: PRILOSEC Take 40 mg by mouth in the morning.   polyethylene glycol 17 g packet Commonly known as: MIRALAX / GLYCOLAX Take 17 g by mouth daily as needed for moderate constipation.   polyvinyl alcohol 1.4 % ophthalmic solution Commonly known as: LIQUIFILM TEARS Place 1 drop into both eyes daily.  tamsulosin 0.4 MG Caps capsule Commonly known as: FLOMAX Take 0.8 mg by mouth every evening.   thiamine 100 MG tablet Commonly known as: Vitamin B-1 Take 1 tablet (100 mg total) by mouth daily.   torsemide 20 MG tablet Commonly known as: DEMADEX Take 1 tablet (20 mg total) by mouth daily.   Vitamin D (Ergocalciferol) 1.25 MG (50000 UNIT) Caps capsule Commonly known as: DRISDOL Take 50,000 Units by mouth every 7 (seven) days. (Mondays)        Follow-up Information     [name], MD Follow up.   Specialty: Internal Medicine Contact information: [address] [place] [address] [phone]                Allergies  Allergen Reactions   Ace Inhibitors Swelling and Cough    (Not on MAR at [name])   Haloperidol And Related     Do NOT give anti-psychotics due to risk of  Torsades and QT prolongation (per Dr. Teena Irani request)   Other Itching    [name]   Shellfish Allergy     Listed on MAR   Penicillins Itching and Rash    Has patient had a PCN reaction causing immediate  rash, facial/tongue/throat swelling, SOB or lightheadedness with hypotension: Unknown Has patient had a PCN reaction causing severe rash involving mucus membranes or skin necrosis: Unknown Has patient had a PCN reaction that required hospitalization: Unknown Has patient had a PCN reaction occurring within the last 10 years: Unknown If all of the above answers are "NO", then may proceed with Cephalosporin use.  Tolerated ancef (12-17-22)     Consultations:    Procedures/Studies: DG Abd Portable 1V  Result Date: 08/03/2023 CLINICAL DATA:  [id number] Constipation [id number] EXAM: PORTABLE ABDOMEN - 1 VIEW COMPARISON:  KUB, 08/02/2023.  CT, FINDINGS: Nondilated small bowel. Improved appearance at rectosigmoid with interval clearance of previously-demonstrated burden. Residual at ascending and descending colon, with a nonobstructed bowel-gas pattern. No interval osseous abnormality IMPRESSION: 1. Improving colonic appearance with interval clearance at rectosigmoid Residual burden ascending and descending colon. 2. Nonobstructed bowel-gas pattern. Electronically Signed   By: Roanna Banning M.D.   On: 08/03/2023 07:17   DG Abd Portable 1V  Result Date: 08/02/2023 CLINICAL DATA:  [id number] Constipation [id number] EXAM: PORTABLE ABDOMEN - 1 VIEW COMPARISON:  December 09, 2022, July 30, 2023 FINDINGS: Gaseous distension of the stomach. No dilated small bowel loops are discretely visualized. Large colonic stool burden diffusely throughout the colon. Moderate to large rectal stool ball. Status post median sternotomy. Degenerative changes of the lumbar spine. Vascular calcifications. IMPRESSION: Large colonic stool burden with moderate to large rectal stool ball. Electronically Signed   By: Meda Klinefelter M.D.   On: 08/02/2023 13:22   CT ABDOMEN PELVIS W CONTRAST  Result Date: 07/30/2023 CLINICAL DATA:  Abdominal pain EXAM: CT ABDOMEN AND PELVIS WITH CONTRAST TECHNIQUE: Multidetector CT imaging of the abdomen and  pelvis was performed using the standard protocol following bolus administration of intravenous contrast. RADIATION DOSE REDUCTION: This exam was performed according to the departmental dose-optimization program which includes automated exposure control, adjustment of the mA and/or kV according to patient size and/or use of iterative reconstruction technique. CONTRAST:  OMNIPAQUE IOHEXOL 300 MG/ML  SOLN COMPARISON:  02/14/2023 FINDINGS: Lower chest: Scarring/atelectasis in the right middle lobe and lingula, improved. Hepatobiliary: Liver is within normal limits. Gallbladder is notable for layering tiny gallstones (series 2/image 27), without associated inflammatory changes. No intrahepatic  or extrahepatic duct dilatation. Pancreas: Within normal limits. Spleen: Calcified splenic granulomata. Adrenals/Urinary Tract: Adrenal glands are within normal limits. Kidneys are within normal limits. No renal, ureteral, or bladder calculi. No hydronephrosis. Bladder decompressed by an indwelling Foley catheter. Stomach/Bowel: Stomach is within normal limits. No evidence of bowel obstruction. Appendix is within normal limits (series 2/image 86). Moderate left colonic stool burden, with pericolonic stranding along the rectosigmoid colon (series 2/image 36), suggesting stercoral colitis. Vascular/Lymphatic: No evidence of abdominal aortic aneurysm. Atherosclerotic calcifications of the abdominal aorta and branch vessels, although vessels remain patent. No suspicious abdominopelvic lymphadenopathy. Reproductive: Prostate is unremarkable. Other: Mild abdominal ascites. Musculoskeletal: Mild degenerative changes of the visualized thoracolumbar spine. Median sternotomy. IMPRESSION: Suspected stercoral colitis involving the rectosigmoid colon. Underlying constipation. Cholelithiasis, without associated inflammatory changes. Electronically Signed   By: Charline Bills M.D.   On: 07/30/2023 19:01   (Echo, Carotid, EGD,  Colonoscopy, ERCP)    Subjective: Pt c/o fatigue    Discharge Exam: Vitals:   08/03/23 0447 08/03/23 0813  BP: (!) 144/65 (!) 136/58  Pulse: (!) 58 (!) 56  Resp: 18   Temp: 98.3 F (36.8 C) 99 F (37.2 C)  SpO2: 96% 97%   Vitals:   08/02/23 2038 08/03/23 0447 08/03/23 0500 08/03/23 0813  BP: (!) 151/71 (!) 144/65  (!) 136/58  Pulse: 67 (!) 58  (!) 56  Resp:  18    Temp: 98.3 F (36.8 C) 98.3 F (36.8 C)  99 F (37.2 C)  TempSrc: Oral Oral  Oral  SpO2: 94% 96%  97%  Weight:   112.4 kg     General: Pt is alert, awake, not in acute distress Cardiovascular: S1/S2 +, no rubs, no gallops Respiratory: CTA bilaterally, no wheezing, no rhonchi Abdominal: Soft, NT, obese, bowel sounds + Extremities: no cyanosis    The results of significant diagnostics from this hospitalization (including imaging, microbiology, ancillary and laboratory) are listed below for reference.     Microbiology: Recent Results (from the past 240 hour(s))  Culture, blood (Routine X 2) w Reflex to ID Panel     Status: None (Preliminary result)   Collection Time: 07/30/23  9:29 PM   Specimen: BLOOD LEFT ARM  Result Value Ref Range Status   Specimen Description BLOOD LEFT ARM  Final   Special Requests   Final    BOTTLES DRAWN AEROBIC AND ANAEROBIC Blood Culture adequate volume   Culture   Final    NO GROWTH 4 DAYS Performed at [facility], [address] [place], [place] [address]    Report Status PENDING  Incomplete  Culture, blood (Routine X 2) w Reflex to ID Panel     Status: None (Preliminary result)   Collection Time: 07/30/23  9:29 PM   Specimen: BLOOD LEFT HAND  Result Value Ref Range Status   Specimen Description BLOOD LEFT HAND  Final   Special Requests   Final    BOTTLES DRAWN AEROBIC AND ANAEROBIC Blood Culture adequate volume   Culture   Final    NO GROWTH 4 DAYS Performed at [facility], [address], [place], [place] [address]    Report Status PENDING   Incomplete     Labs: BNP (last 3 results) Recent Labs    11/25/22 1101 03/26/23 2322 07/30/23 1414  BNP 695.7* 891.3* 320.2*   Basic Metabolic Panel: Recent Labs  Lab 07/30/23 1414 07/31/23 0454 08/01/23 0519 08/02/23 0453 08/03/23 0614  NA 142 139 136 136 136  K  3.7 3.7 4.2 4.3 4.1  CL 108 105 101 102 103  CO2 25 25 27 24 24   GLUCOSE 119* 132* 90 105* 101*  BUN 32* 31* 37* 42* 47*  CREATININE 1.68* 1.58* 2.35* 2.70* 2.37*  CALCIUM 8.6* 8.2* 8.0* 7.8* 8.3*   Liver Function Tests: Recent Labs  Lab 07/30/23 1414  AST 13*  ALT 12  ALKPHOS 98  BILITOT 0.7  PROT 6.8  ALBUMIN 3.3*   Recent Labs  Lab 07/30/23 1414  LIPASE 46   No results for input(s): "AMMONIA" in the last 168 hours. CBC: Recent Labs  Lab 07/30/23 1414 07/31/23 0454 08/01/23 0519 08/02/23 0453 08/03/23 0614  WBC 11.7* 13.6* 8.2 8.7 9.3  HGB 9.4* 9.1* 7.8* 8.2* 8.4*  HCT 29.6* 30.2* 24.7* 25.9* 25.7*  MCV 94.3 97.1 94.3 92.5 91.8  PLT 254 252 210 199 198   Cardiac Enzymes: No results for input(s): "CKTOTAL", "CKMB", "CKMBINDEX", "TROPONINI" in the last 168 hours. BNP: Invalid input(s): "POCBNP" CBG: Recent Labs  Lab 08/02/23 1155 08/02/23 1703 08/02/23 2106 08/03/23 0813 08/03/23 1139  GLUCAP 110* 144* 128* 103* 134*   D-Dimer No results for input(s): "DDIMER" in the last 72 hours. Hgb A1c No results for input(s): "HGBA1C" in the last 72 hours. Lipid Profile No results for input(s): "CHOL", "HDL", "LDLCALC", "TRIG", "CHOLHDL", "LDLDIRECT" in the last 72 hours. Thyroid function studies No results for input(s): "TSH", "T4TOTAL", "T3FREE", "THYROIDAB" in the last 72 hours.  Invalid input(s): "FREET3" Anemia work up No results for input(s): "VITAMINB12", "FOLATE", "FERRITIN", "TIBC", "IRON", "RETICCTPCT" in the last 72 hours. Urinalysis    Component Value Date/Time   COLORURINE YELLOW (A) 07/30/2023 1650   APPEARANCEUR CLEAR (A) 07/30/2023 1650   LABSPEC 1.011 07/30/2023  1650   PHURINE 5.0 07/30/2023 1650   GLUCOSEU NEGATIVE 07/30/2023 1650   HGBUR NEGATIVE 07/30/2023 1650   BILIRUBINUR NEGATIVE 07/30/2023 1650   KETONESUR NEGATIVE 07/30/2023 1650   PROTEINUR 100 (A) 07/30/2023 1650   NITRITE NEGATIVE 07/30/2023 1650   LEUKOCYTESUR NEGATIVE 07/30/2023 1650   Sepsis Labs Recent Labs  Lab 07/31/23 0454 08/01/23 0519 08/02/23 0453 08/03/23 0614  WBC 13.6* 8.2 8.7 9.3   Microbiology Recent Results (from the past 240 hour(s))  Culture, blood (Routine X 2) w Reflex to ID Panel     Status: None (Preliminary result)   Collection Time: 07/30/23  9:29 PM   Specimen: BLOOD LEFT ARM  Result Value Ref Range Status   Specimen Description BLOOD LEFT ARM  Final   Special Requests   Final    BOTTLES DRAWN AEROBIC AND ANAEROBIC Blood Culture adequate volume   Culture   Final    NO GROWTH 4 DAYS Performed at [facility], [address] [place], [place] [address]    Report Status PENDING  Incomplete  Culture, blood (Routine X 2) w Reflex to ID Panel     Status: None (Preliminary result)   Collection Time: 07/30/23  9:29 PM   Specimen: BLOOD LEFT HAND  Result Value Ref Range Status   Specimen Description BLOOD LEFT HAND  Final   Special Requests   Final    BOTTLES DRAWN AEROBIC AND ANAEROBIC Blood Culture adequate volume   Culture   Final    NO GROWTH 4 DAYS Performed at [facility], [address], [place], [place] [address]    Report Status PENDING  Incomplete     Time coordinating discharge: Over 30 minutes  SIGNED:   Charise Killian, MD  [facility] 08/03/2023, 1:01 PM Pager   If 7PM-7AM, please contact night-coverage [email or web address]

## 2023-08-03 NOTE — Plan of Care (Signed)
  Problem: Education: Goal: Knowledge of the prescribed therapeutic regimen will improve Outcome: Progressing Goal: Ability to verbalize activity precautions or restrictions will improve Outcome: Progressing Goal: Understanding of discharge needs will improve Outcome: Progressing   Problem: Activity: Goal: Ability to perform//tolerate increased activity and mobilize with assistive devices will improve Outcome: Progressing   Problem: Clinical Measurements: Goal: Postoperative complications will be avoided or minimized Outcome: Progressing   Problem: Self-Care: Goal: Ability to meet self-care needs will improve Outcome: Progressing   Problem: Self-Concept: Goal: Ability to maintain and perform role responsibilities to the fullest extent possible will improve Outcome: Progressing   Problem: Pain Management: Goal: Pain level will decrease with appropriate interventions Outcome: Progressing   Problem: Skin Integrity: Goal: Demonstration of wound healing without infection will improve Outcome: Progressing   Problem: Education: Goal: Ability to describe self-care measures that may prevent or decrease complications (Diabetes Survival Skills Education) will improve Outcome: Progressing Goal: Individualized Educational Video(s) Outcome: Progressing   Problem: Coping: Goal: Ability to adjust to condition or change in health will improve Outcome: Progressing   Problem: Fluid Volume: Goal: Ability to maintain a balanced intake and output will improve Outcome: Progressing   Problem: Health Behavior/Discharge Planning: Goal: Ability to identify and utilize available resources and services will improve Outcome: Progressing Goal: Ability to manage health-related needs will improve Outcome: Progressing   Problem: Metabolic: Goal: Ability to maintain appropriate glucose levels will improve Outcome: Progressing   Problem: Nutritional: Goal: Maintenance of adequate nutrition will  improve Outcome: Progressing Goal: Progress toward achieving an optimal weight will improve Outcome: Progressing   Problem: Skin Integrity: Goal: Risk for impaired skin integrity will decrease Outcome: Progressing   Problem: Tissue Perfusion: Goal: Adequacy of tissue perfusion will improve Outcome: Progressing   Problem: Education: Goal: Knowledge of General Education information will improve Description: Including pain rating scale, medication(s)/side effects and non-pharmacologic comfort measures Outcome: Progressing   Problem: Health Behavior/Discharge Planning: Goal: Ability to manage health-related needs will improve Outcome: Progressing   Problem: Clinical Measurements: Goal: Ability to maintain clinical measurements within normal limits will improve Outcome: Progressing Goal: Will remain free from infection Outcome: Progressing Goal: Diagnostic test results will improve Outcome: Progressing Goal: Respiratory complications will improve Outcome: Progressing Goal: Cardiovascular complication will be avoided Outcome: Progressing   Problem: Activity: Goal: Risk for activity intolerance will decrease Outcome: Progressing   Problem: Nutrition: Goal: Adequate nutrition will be maintained Outcome: Progressing   Problem: Coping: Goal: Level of anxiety will decrease Outcome: Progressing   Problem: Elimination: Goal: Will not experience complications related to bowel motility Outcome: Progressing Goal: Will not experience complications related to urinary retention Outcome: Progressing   Problem: Pain Managment: Goal: General experience of comfort will improve Outcome: Progressing   Problem: Safety: Goal: Ability to remain free from injury will improve Outcome: Progressing   Problem: Skin Integrity: Goal: Risk for impaired skin integrity will decrease Outcome: Progressing   

## 2023-08-03 NOTE — Care Management Important Message (Signed)
Important Message  Patient Details  Name: FINDLEY EWTON MRN: [id number] Date of Birth: 1961/08/07   Medicare Important Message Given:  N/A - LOS <3 / Initial given by admissions     Olegario Messier A Woodie Trusty 08/03/2023, 9:48 AM

## 2023-08-04 LAB — CULTURE, BLOOD (ROUTINE X 2)
Culture: NO GROWTH
Culture: NO GROWTH
Special Requests: ADEQUATE
Special Requests: ADEQUATE

## 2023-08-05 ENCOUNTER — Encounter (HOSPITAL_COMMUNITY): Payer: Self-pay | Admitting: Hematology

## 2023-08-18 ENCOUNTER — Encounter: Payer: Self-pay | Admitting: Nephrology

## 2023-08-19 ENCOUNTER — Telehealth: Payer: Self-pay

## 2023-08-24 ENCOUNTER — Encounter: Payer: Self-pay | Admitting: Gastroenterology

## 2023-09-29 ENCOUNTER — Ambulatory Visit (INDEPENDENT_AMBULATORY_CARE_PROVIDER_SITE_OTHER): Payer: Medicare Other | Admitting: Gastroenterology

## 2023-09-29 ENCOUNTER — Encounter: Payer: Self-pay | Admitting: Gastroenterology

## 2023-09-29 ENCOUNTER — Telehealth (INDEPENDENT_AMBULATORY_CARE_PROVIDER_SITE_OTHER): Payer: Self-pay

## 2023-09-29 VITALS — BP 116/62 | HR 64 | Temp 97.3°F | Ht 70.0 in | Wt 230.0 lb

## 2023-09-29 DIAGNOSIS — K5900 Constipation, unspecified: Secondary | ICD-10-CM

## 2023-09-29 DIAGNOSIS — Z85048 Personal history of other malignant neoplasm of rectum, rectosigmoid junction, and anus: Secondary | ICD-10-CM | POA: Diagnosis not present

## 2023-09-29 DIAGNOSIS — K5289 Other specified noninfective gastroenteritis and colitis: Secondary | ICD-10-CM | POA: Diagnosis not present

## 2023-09-29 MED ORDER — LUBIPROSTONE 8 MCG PO CAPS: 8.0000 ug | ORAL_CAPSULE | Freq: Two times a day (BID) | ORAL | 5 refills | Status: DC

## 2023-09-29 NOTE — Progress Notes (Signed)
Gastroenterology Office Note     Primary Care Physician:  [name], MD  Primary Gastroenterologist: Dr. Marletta Lor   Chief Complaint   Chief Complaint  Patient presents with   Follow-up    Patient here today for a follow up visit. Patient denies any current issues.     History of Present Illness   Shane Chambers is a 62 y.o. male presenting today with a history of rectal cancer diagnosed at initial screening colonoscopy in Sept 2022 without need for surgery or chemotherapy, followed by Oncology, with recent colonoscopy Aug 2024 as noted below. He will be due for 3 year surveillance in 2027.  Additional history including normocyctic anemia r/t CKD and relative IDA.   In interim from last visit, he underwent colonoscopy Aug 2024 with non-bleeding internal hemorrhoids, post-polypectomy rectal scar s/p biopsy and tattoo seen in rectum. One 7 mm cecal polyp. Tubular adenoma, negative recurrence on path. 3 year surveillance due.   Inpatient at [facility] in Sept 2024 with stercoral colitis. Underlying constipation.   When takes the Linzess, can't function as has significant diarrhea affecting quality of life. Lately will skip a day then have a loose stool. Sunday had a loose stool after oreo cookies. Wants to avoid Linzess. Baseline constipation.   He has appt with Oncology upcoming Jan 30th.    Past Medical History:  Diagnosis Date   Anemia    Arthritis    CAD (coronary artery disease)    a. s/p CABG in 03/2020 with LIMA-LAD, RIMA-PL, RA-D1-RI-OM1   Cancer (HCC)    rectal   Cellulitis    COPD (chronic obstructive pulmonary disease) (HCC)    Depression    Diabetes mellitus without complication (HCC)    GERD (gastroesophageal reflux disease)    History of kidney stones    Hyperlipidemia 12/01/2019   Hypertension    Hypothyroidism    Ischemic cardiomyopathy    a. EF 20-25% by echo in 02/2020 b. at 40% by echo in 03/2020 c. EF normalized to 60-65% by echo in 04/2020    Myocardial infarction [facility])    Peripheral vascular disease (HCC)    Sleep apnea    Type 2 diabetes mellitus (HCC)     Past Surgical History:  Procedure Laterality Date   AMPUTATION Right 10/05/2022   Procedure: AMPUTATION BELOW KNEE;  Surgeon: Nadara Mustard, MD;  Location: [facility] OR;  Service: Orthopedics;  Laterality: Right;   AMPUTATION Right 11/14/2022   Procedure: AMPUTATION BELOW KNEE REVISION; WASHOUT;  Surgeon: Renford Dills, MD;  Location: [facility] ORS;  Service: Vascular;  Laterality: Right;   AMPUTATION Right 11/18/2022   Procedure: AMPUTATION BELOW KNEE REVISION AND CLOSURE;  Surgeon: Renford Dills, MD;  Location: [facility] ORS;  Service: Vascular;  Laterality: Right;   APPLICATION OF WOUND VAC Right 10/05/2022   Procedure: APPLICATION OF WOUND VAC;  Surgeon: Nadara Mustard, MD;  Location: [facility] OR;  Service: Orthopedics;  Laterality: Right;   BIOPSY  07/18/2021   Procedure: BIOPSY;  Surgeon: Lanelle Bal, DO;  Location: [facility];  Service: Endoscopy;;   BIOPSY  01/09/2022   Procedure: BIOPSY;  Surgeon: Lanelle Bal, DO;  [facility]: [facility];  Service: Endoscopy;;   BIOPSY  06/29/2023   Procedure: BIOPSY;  Surgeon: Lanelle Bal, DO;  Location: [facility];  Service: Endoscopy;;   COLONOSCOPY WITH PROPOFOL N/A 07/18/2021   Carver: 15 millimeter polyp removed from the sigmoid colon, 5 mm polyp  removed from sigmoid colon.  Nonbleeding internal hemorrhoids.  Significant looping of the colon. sigmoid path showed invasive colonic adenocarcinoma involving tubular adenoma (invades to depth of 2mm, carcinoma 1mm from margin, no lymphovascular invasion, no poorly differentiated component.   COLONOSCOPY WITH PROPOFOL N/A 06/29/2023   Procedure: COLONOSCOPY WITH PROPOFOL;  Surgeon: Lanelle Bal, DO;  Location: [facility];  Service: Endoscopy;  Laterality: N/A;  7:30 am, asa 3   CORONARY ARTERY BYPASS GRAFT N/A 03/13/2020   Procedure: CORONARY ARTERY BYPASS GRAFTING  (CABG) times five using bilateral Internal mammary arteries and left radial artery;  Surgeon: Linden Dolin, MD;  Location: [facility] OR;  Service: Open Heart Surgery;  Laterality: N/A;   DENTAL SURGERY     ESOPHAGOGASTRODUODENOSCOPY (EGD) WITH PROPOFOL N/A 07/18/2021   Carver: 1 gastric polyp status post biopsy, gastritis. gastric bx with slight chronic inflammation and no H.pyori. GEJ polypectomy with mild inflammation only   FLEXIBLE SIGMOIDOSCOPY N/A 08/26/2021   Carver: Nonbleeding internal hemorrhoids.  15 mm ulcers from previous polypectomy found in the rectum.  No evidence of previous polyp.  Located 5 to 8 cm from anal verge.   FLEXIBLE SIGMOIDOSCOPY N/A 01/09/2022   Procedure: FLEXIBLE SIGMOIDOSCOPY;  Surgeon: Lanelle Bal, DO;  [facility];  Service: Endoscopy;  Laterality: N/A;   POLYPECTOMY  07/18/2021   Procedure: POLYPECTOMY INTESTINAL;  Surgeon: Lanelle Bal, DO;  [facility];  Service: Endoscopy;;   POLYPECTOMY  06/29/2023   Procedure: POLYPECTOMY INTESTINAL;  Surgeon: Lanelle Bal, DO;  [facility];  Service: Endoscopy;;   RADIAL ARTERY HARVEST Left 03/13/2020   Procedure: RADIAL ARTERY HARVEST,;  Surgeon: Linden Dolin, MD;  Location: [facility] OR;  Service: Open Heart Surgery;  Laterality: Left;   RIGHT/LEFT HEART CATH AND CORONARY ANGIOGRAPHY N/A 03/07/2020   Procedure: RIGHT/LEFT HEART CATH AND CORONARY ANGIOGRAPHY;  Surgeon: Tonny Bollman, MD;  Location: [facility] INVASIVE CV LAB;  Service: Cardiovascular;  Laterality: N/A;   STUMP REVISION Right 12/17/2022   Procedure: RIGHT BELOW KNEE AMPUTATION REVISION;  Surgeon: Nadara Mustard, MD;  Location: [facility] OR;  Service: Orthopedics;  Laterality: Right;   SUBMUCOSAL LIFTING INJECTION  01/09/2022   Procedure: SUBMUCOSAL LIFTING INJECTION;  Surgeon: Lanelle Bal, DO;  [facility];  Service: Endoscopy;;   SUBMUCOSAL TATTOO INJECTION  01/09/2022   Procedure: SUBMUCOSAL TATTOO INJECTION;   Surgeon: Lanelle Bal, DO;  [facility]: [facility];  Service: Endoscopy;;   TEE WITHOUT CARDIOVERSION N/A 03/13/2020   Procedure: TRANSESOPHAGEAL ECHOCARDIOGRAM (TEE);  Surgeon: Linden Dolin, MD;  Location: [facility] OR;  Service: Open Heart Surgery;  Laterality: N/A;       Allergies as of 09/29/2023 - Review Complete 09/29/2023  Allergen Reaction Noted   Ceftriaxone Anaphylaxis 09/29/2023   Ace inhibitors Swelling and Cough 12/11/2017   Haloperidol and related  10/14/2022   Other Itching 11/15/2019   Shellfish allergy  10/05/2022   Penicillins Itching and Rash 12/11/2017    Family History  Problem Relation Age of Onset   Hypertension Mother     Social History   Socioeconomic History   Marital status: Single    Spouse name: Not on file   Number of children: Not on file   Years of education: Not on file   Highest education level: Not on file  Occupational History   Not on file  Tobacco Use   Smoking status: Former    Current packs/day: 0.00    Types:  Cigarettes    Quit date: 05/25/1995    Years since quitting: 28.3   Smokeless tobacco: Never  Vaping Use   Vaping status: Never Used  Substance and Sexual Activity   Alcohol use: Not Currently    Comment: rarely   Drug use: No   Sexual activity: Not Currently  Other Topics Concern   Not on file  Social History Narrative   Not on file   Social Determinants of Health   Financial Resource Strain: Not on file  Food Insecurity: No Food Insecurity (07/31/2023)   Hunger Vital Sign    Worried About Running Out of Food in the Last Year: Never true    Ran Out of Food in the Last Year: Never true  Transportation Needs: No Transportation Needs (07/31/2023)   PRAPARE - Administrator, Civil Service (Medical): No    Lack of Transportation (Non-Medical): No  Physical Activity: Not on file  Stress: Not on file  Social Connections: Not on file  Intimate Partner Violence: Not At Risk (07/31/2023)   Humiliation,  Afraid, Rape, and Kick questionnaire    Fear of Current or Ex-Partner: No    Emotionally Abused: No    Physically Abused: No    Sexually Abused: No     Review of Systems   Gen: Denies any fever, chills, fatigue, weight loss, lack of appetite.  CV: Denies chest pain, heart palpitations, peripheral edema, syncope.  Resp: Denies shortness of breath at rest or with exertion. Denies wheezing or cough.  GI: Denies dysphagia or odynophagia. Denies jaundice, hematemesis, fecal incontinence. GU : Denies urinary burning, urinary frequency, urinary hesitancy MS: Denies joint pain, muscle weakness, cramps, or limitation of movement.  Derm: Denies rash, itching, dry skin Psych: Denies depression, anxiety, memory loss, and confusion Heme: Denies bruising, bleeding, and enlarged lymph nodes.   Physical Exam   BP 116/62 (BP Location: Right Arm, Patient Position: Sitting, Cuff Size: Large)   Pulse 64   Temp (!) 97.3 F (36.3 C) (Temporal)   Ht 5\' 10"  (1.778 m)   Wt 230 lb (104.3 kg)   BMI 33.00 kg/m  General:   Alert and oriented. Pleasant and cooperative. Well-nourished and well-developed.  Head:  Normocephalic and atraumatic. Eyes:  Without icterus Abdomen:  +BS, soft, non-tender and non-distended. No HSM noted. No guarding or rebound. No masses appreciated.  Rectal:  Deferred  Msk:  right BKA Extremities:  Without edema. Neurologic:  Alert and  oriented x4 Skin:  Intact without significant lesions or rashes. Psych:  Alert and cooperative. Normal mood and affect.   Assessment   Shane Chambers is a 62 y.o. male presenting today with a history of rectal cancer diagnosed at initial screening colonoscopy in Sept 2022 without need for surgery or chemotherapy, followed by Oncology, with surveillance colonoscopy on file from Aug 2024.   Rectal cancer: without recurrence on recent colonoscopy. 3 year surveillance 2027 will be due. Continue follow-up with Oncology, next in January.    Constipation: and recent hospitalization with stercoral colitis. Linzess overshooting the mark and likely has overflow incontinence with this. Will trial Amitiza 8 mcg po BID with food. He may need this increased.      PLAN    Stop Linzess and lactulose Amitiza 8 mcg po BID with food, call with update Colonoscopy 2027 3 month follow-up Continue follow-up with Oncology    Gelene Mink, PhD, ANP-BC [facility]

## 2023-09-29 NOTE — Telephone Encounter (Signed)
Patient did not bring a medication list from [facility] center for today's ov with Lewie Loron, Np. I called the health care center and asked that they fax over the patient's most recent Medication list. I made them aware that the Provider was waiting on this so she could see the patient. They did not send one to the office. Patient was seen today by the provider on what was already listed in our EMR. I did make the patient aware that I had called the facility and they did not send it over as requested.

## 2023-09-29 NOTE — Telephone Encounter (Signed)
MAR came in later today and I have reconciled the patient medication list to the [facility] faxed to [place] from facility.

## 2023-09-29 NOTE — Patient Instructions (Signed)
Let's stop Linzess and lactulose.  Only take Amitiza twice a day WITH FOOD to avoid nausea. If this is not helpful, please have the staff call us, as we can increase this.  We will see you in 3 months!  Have a wonderful holiday season!  I enjoyed seeing you again today! I value our relationship and want to provide genuine, compassionate, and quality care. You may receive a survey regarding your visit with me, and I welcome your feedback! Thanks so much for taking the time to complete this. I look forward to seeing you again.      Gelene Mink, PhD, ANP-BC [facility]

## 2023-10-24 ENCOUNTER — Encounter (HOSPITAL_COMMUNITY): Payer: Self-pay | Admitting: Internal Medicine

## 2023-10-24 ENCOUNTER — Other Ambulatory Visit: Payer: Self-pay

## 2023-10-24 ENCOUNTER — Emergency Department (HOSPITAL_COMMUNITY): Payer: Medicare Other

## 2023-10-24 ENCOUNTER — Inpatient Hospital Stay (HOSPITAL_COMMUNITY): Payer: Medicare Other

## 2023-10-24 ENCOUNTER — Inpatient Hospital Stay (HOSPITAL_COMMUNITY)
Admission: EM | Admit: 2023-10-24 | Discharge: 2023-10-31 | DRG: 291 | Disposition: A | Discharge status: Skilled Nursing Facility | Payer: Medicare Other | Source: Skilled Nursing Facility | Attending: Internal Medicine | Admitting: Internal Medicine

## 2023-10-24 DIAGNOSIS — E875 Hyperkalemia: Secondary | ICD-10-CM | POA: Diagnosis present

## 2023-10-24 DIAGNOSIS — Z8249 Family history of ischemic heart disease and other diseases of the circulatory system: Secondary | ICD-10-CM | POA: Diagnosis not present

## 2023-10-24 DIAGNOSIS — J9621 Acute and chronic respiratory failure with hypoxia: Secondary | ICD-10-CM | POA: Diagnosis present

## 2023-10-24 DIAGNOSIS — Z89511 Acquired absence of right leg below knee: Secondary | ICD-10-CM

## 2023-10-24 DIAGNOSIS — Z6841 Body Mass Index (BMI) 40.0 and over, adult: Secondary | ICD-10-CM | POA: Diagnosis not present

## 2023-10-24 DIAGNOSIS — N1831 Chronic kidney disease, stage 3a: Secondary | ICD-10-CM | POA: Diagnosis present

## 2023-10-24 DIAGNOSIS — Z951 Presence of aortocoronary bypass graft: Secondary | ICD-10-CM | POA: Diagnosis not present

## 2023-10-24 DIAGNOSIS — Z9981 Dependence on supplemental oxygen: Secondary | ICD-10-CM | POA: Diagnosis not present

## 2023-10-24 DIAGNOSIS — E785 Hyperlipidemia, unspecified: Secondary | ICD-10-CM | POA: Diagnosis present

## 2023-10-24 DIAGNOSIS — N184 Chronic kidney disease, stage 4 (severe): Secondary | ICD-10-CM | POA: Diagnosis present

## 2023-10-24 DIAGNOSIS — I5033 Acute on chronic diastolic (congestive) heart failure: Secondary | ICD-10-CM | POA: Diagnosis present

## 2023-10-24 DIAGNOSIS — D649 Anemia, unspecified: Secondary | ICD-10-CM | POA: Diagnosis present

## 2023-10-24 DIAGNOSIS — F32A Depression, unspecified: Secondary | ICD-10-CM | POA: Diagnosis present

## 2023-10-24 DIAGNOSIS — D509 Iron deficiency anemia, unspecified: Secondary | ICD-10-CM | POA: Diagnosis present

## 2023-10-24 DIAGNOSIS — Z79899 Other long term (current) drug therapy: Secondary | ICD-10-CM

## 2023-10-24 DIAGNOSIS — Z7982 Long term (current) use of aspirin: Secondary | ICD-10-CM

## 2023-10-24 DIAGNOSIS — E119 Type 2 diabetes mellitus without complications: Secondary | ICD-10-CM

## 2023-10-24 DIAGNOSIS — E039 Hypothyroidism, unspecified: Secondary | ICD-10-CM | POA: Diagnosis present

## 2023-10-24 DIAGNOSIS — I252 Old myocardial infarction: Secondary | ICD-10-CM

## 2023-10-24 DIAGNOSIS — Z881 Allergy status to other antibiotic agents status: Secondary | ICD-10-CM

## 2023-10-24 DIAGNOSIS — E1151 Type 2 diabetes mellitus with diabetic peripheral angiopathy without gangrene: Secondary | ICD-10-CM | POA: Diagnosis present

## 2023-10-24 DIAGNOSIS — I10 Essential (primary) hypertension: Secondary | ICD-10-CM | POA: Diagnosis not present

## 2023-10-24 DIAGNOSIS — R45851 Suicidal ideations: Secondary | ICD-10-CM | POA: Diagnosis not present

## 2023-10-24 DIAGNOSIS — I130 Hypertensive heart and chronic kidney disease with heart failure and stage 1 through stage 4 chronic kidney disease, or unspecified chronic kidney disease: Secondary | ICD-10-CM | POA: Diagnosis present

## 2023-10-24 DIAGNOSIS — I2510 Atherosclerotic heart disease of native coronary artery without angina pectoris: Secondary | ICD-10-CM | POA: Diagnosis present

## 2023-10-24 DIAGNOSIS — Z880 Allergy status to penicillin: Secondary | ICD-10-CM

## 2023-10-24 DIAGNOSIS — Z87891 Personal history of nicotine dependence: Secondary | ICD-10-CM

## 2023-10-24 DIAGNOSIS — Z794 Long term (current) use of insulin: Secondary | ICD-10-CM

## 2023-10-24 DIAGNOSIS — Z91013 Allergy to seafood: Secondary | ICD-10-CM

## 2023-10-24 DIAGNOSIS — N400 Enlarged prostate without lower urinary tract symptoms: Secondary | ICD-10-CM | POA: Diagnosis not present

## 2023-10-24 DIAGNOSIS — J449 Chronic obstructive pulmonary disease, unspecified: Secondary | ICD-10-CM | POA: Diagnosis present

## 2023-10-24 DIAGNOSIS — Z7951 Long term (current) use of inhaled steroids: Secondary | ICD-10-CM

## 2023-10-24 DIAGNOSIS — Z91048 Other nonmedicinal substance allergy status: Secondary | ICD-10-CM

## 2023-10-24 DIAGNOSIS — E1122 Type 2 diabetes mellitus with diabetic chronic kidney disease: Secondary | ICD-10-CM | POA: Diagnosis present

## 2023-10-24 DIAGNOSIS — Z79890 Hormone replacement therapy (postmenopausal): Secondary | ICD-10-CM

## 2023-10-24 DIAGNOSIS — K219 Gastro-esophageal reflux disease without esophagitis: Secondary | ICD-10-CM | POA: Diagnosis present

## 2023-10-24 DIAGNOSIS — Z85048 Personal history of other malignant neoplasm of rectum, rectosigmoid junction, and anus: Secondary | ICD-10-CM

## 2023-10-24 DIAGNOSIS — I255 Ischemic cardiomyopathy: Secondary | ICD-10-CM | POA: Diagnosis present

## 2023-10-24 DIAGNOSIS — N179 Acute kidney failure, unspecified: Secondary | ICD-10-CM | POA: Diagnosis present

## 2023-10-24 DIAGNOSIS — Z888 Allergy status to other drugs, medicaments and biological substances status: Secondary | ICD-10-CM

## 2023-10-24 LAB — BASIC METABOLIC PANEL
Anion gap: 10 (ref 5–15)
BUN: 63 mg/dL — ABNORMAL HIGH (ref 6–20)
CO2: 26 mmol/L (ref 22–32)
Calcium: 8.5 mg/dL — ABNORMAL LOW (ref 8.9–10.3)
Chloride: 102 mmol/L (ref 98–111)
Creatinine, Ser: 3.66 mg/dL — ABNORMAL HIGH (ref 0.61–1.24)
GFR, Estimated: 18 mL/min — ABNORMAL LOW (ref >60)
Glucose, Bld: 141 mg/dL — ABNORMAL HIGH (ref 70–99)
Potassium: 5.4 mmol/L — ABNORMAL HIGH (ref 3.5–5.1)
Sodium: 138 mmol/L (ref 135–145)

## 2023-10-24 LAB — ECHOCARDIOGRAM COMPLETE
AR max vel: 1.77 cm2
AV Area VTI: 1.82 cm2
AV Area mean vel: 1.75 cm2
AV Mean grad: 11 mm[Hg]
AV Peak grad: 18.8 mm[Hg]
Ao pk vel: 2.17 m/s
Area-P 1/2: 3.72 cm2
Height: 70 in
S' Lateral: 3.9 cm
Weight: 4688 [oz_av]

## 2023-10-24 LAB — CBC WITH DIFFERENTIAL/PLATELET
Abs Immature Granulocytes: 0.59 10*3/uL — ABNORMAL HIGH (ref 0.00–0.07)
Basophils Absolute: 0 10*3/uL (ref 0.0–0.1)
Basophils Relative: 0 %
Eosinophils Absolute: 0.1 10*3/uL (ref 0.0–0.5)
Eosinophils Relative: 1 %
HCT: 28.3 % — ABNORMAL LOW (ref 39.0–52.0)
Hemoglobin: 8.4 g/dL — ABNORMAL LOW (ref 13.0–17.0)
Immature Granulocytes: 5 %
Lymphocytes Relative: 6 %
Lymphs Abs: 0.7 10*3/uL (ref 0.7–4.0)
MCH: 30.5 pg (ref 26.0–34.0)
MCHC: 29.7 g/dL — ABNORMAL LOW (ref 30.0–36.0)
MCV: 102.9 fL — ABNORMAL HIGH (ref 80.0–100.0)
Monocytes Absolute: 0.7 10*3/uL (ref 0.1–1.0)
Monocytes Relative: 6 %
Neutro Abs: 9.5 10*3/uL — ABNORMAL HIGH (ref 1.7–7.7)
Neutrophils Relative %: 82 %
Platelets: 246 10*3/uL (ref 150–400)
RBC: 2.75 MIL/uL — ABNORMAL LOW (ref 4.22–5.81)
RDW: 14.4 % (ref 11.5–15.5)
WBC: 11.6 10*3/uL — ABNORMAL HIGH (ref 4.0–10.5)
nRBC: 0.3 % — ABNORMAL HIGH (ref 0.0–0.2)

## 2023-10-24 LAB — RESP PANEL BY RT-PCR (RSV, FLU A&B, COVID)  RVPGX2
Influenza A by PCR: NEGATIVE
Influenza B by PCR: NEGATIVE
Resp Syncytial Virus by PCR: NEGATIVE
SARS Coronavirus 2 by RT PCR: NEGATIVE

## 2023-10-24 LAB — BRAIN NATRIURETIC PEPTIDE: B Natriuretic Peptide: 1066 pg/mL — ABNORMAL HIGH (ref 0.0–100.0)

## 2023-10-24 LAB — MRSA NEXT GEN BY PCR, NASAL: MRSA by PCR Next Gen: DETECTED — AB

## 2023-10-24 LAB — TROPONIN I (HIGH SENSITIVITY)
Troponin I (High Sensitivity): 14 ng/L (ref <18)
Troponin I (High Sensitivity): 14 ng/L (ref <18)

## 2023-10-24 MED ORDER — LEVOTHYROXINE SODIUM 75 MCG PO TABS
75.0000 ug | ORAL_TABLET | Freq: Every day | ORAL | Status: DC
Start: 2023-10-25 — End: 2023-10-31
  Administered 2023-10-25 – 2023-10-31 (×7): 75 ug via ORAL
  Filled 2023-10-24 (×7): qty 1

## 2023-10-24 MED ORDER — ASPIRIN 81 MG PO TBEC
81.0000 mg | DELAYED_RELEASE_TABLET | Freq: Every day | ORAL | Status: DC
  Administered 2023-10-25 – 2023-10-31 (×7): 81 mg via ORAL
  Filled 2023-10-24 (×7): qty 1

## 2023-10-24 MED ORDER — BISACODYL 5 MG PO TBEC
5.0000 mg | DELAYED_RELEASE_TABLET | Freq: Every day | ORAL | Status: DC | PRN
  Administered 2023-10-25: 5 mg via ORAL
  Filled 2023-10-24: qty 1

## 2023-10-24 MED ORDER — SODIUM CHLORIDE 0.9% FLUSH: 3.0000 mL | INTRAVENOUS | Status: DC | PRN

## 2023-10-24 MED ORDER — ALBUTEROL SULFATE HFA 108 (90 BASE) MCG/ACT IN AERS: 2.0000 | INHALATION_SPRAY | RESPIRATORY_TRACT | Status: DC | PRN

## 2023-10-24 MED ORDER — FUROSEMIDE 10 MG/ML IJ SOLN
40.0000 mg | Freq: Two times a day (BID) | INTRAMUSCULAR | Status: DC
  Administered 2023-10-24: 40 mg via INTRAVENOUS
  Filled 2023-10-24 (×3): qty 4

## 2023-10-24 MED ORDER — IPRATROPIUM-ALBUTEROL 0.5-2.5 (3) MG/3ML IN SOLN
3.0000 mL | Freq: Once | RESPIRATORY_TRACT | Status: AC
  Administered 2023-10-24: 3 mL via RESPIRATORY_TRACT
  Filled 2023-10-24: qty 3

## 2023-10-24 MED ORDER — SODIUM CHLORIDE 0.9% FLUSH
3.0000 mL | Freq: Two times a day (BID) | INTRAVENOUS | Status: DC
  Administered 2023-10-24 – 2023-10-31 (×14): 3 mL via INTRAVENOUS

## 2023-10-24 MED ORDER — TAMSULOSIN HCL 0.4 MG PO CAPS
0.8000 mg | ORAL_CAPSULE | Freq: Every evening | ORAL | Status: DC
  Filled 2023-10-24: qty 2

## 2023-10-24 MED ORDER — LORATADINE 10 MG PO TABS
10.0000 mg | ORAL_TABLET | Freq: Every day | ORAL | Status: DC
  Administered 2023-10-25 – 2023-10-31 (×7): 10 mg via ORAL
  Filled 2023-10-24 (×7): qty 1

## 2023-10-24 MED ORDER — ATORVASTATIN CALCIUM 40 MG PO TABS
40.0000 mg | ORAL_TABLET | Freq: Every day | ORAL | Status: DC
  Administered 2023-10-24 – 2023-10-30 (×7): 40 mg via ORAL
  Filled 2023-10-24 (×7): qty 1

## 2023-10-24 MED ORDER — TAMSULOSIN HCL 0.4 MG PO CAPS
0.8000 mg | ORAL_CAPSULE | Freq: Every evening | ORAL | Status: DC
  Administered 2023-10-24 – 2023-10-30 (×7): 0.8 mg via ORAL
  Filled 2023-10-24 (×6): qty 2

## 2023-10-24 MED ORDER — SODIUM ZIRCONIUM CYCLOSILICATE 10 G PO PACK
10.0000 g | PACK | Freq: Once | ORAL | Status: AC
  Administered 2023-10-24: 10 g via ORAL
  Filled 2023-10-24: qty 1

## 2023-10-24 MED ORDER — LUBIPROSTONE 8 MCG PO CAPS
8.0000 ug | ORAL_CAPSULE | Freq: Two times a day (BID) | ORAL | Status: DC
Start: 2023-10-24 — End: 2023-10-31
  Administered 2023-10-24 – 2023-10-31 (×14): 8 ug via ORAL
  Filled 2023-10-24 (×14): qty 1

## 2023-10-24 MED ORDER — GABAPENTIN 400 MG PO CAPS
400.0000 mg | ORAL_CAPSULE | Freq: Every day | ORAL | Status: DC
  Administered 2023-10-24 – 2023-10-30 (×7): 400 mg via ORAL
  Filled 2023-10-24 (×7): qty 1

## 2023-10-24 MED ORDER — DEXTROMETHORPHAN-BUPROPION ER 45-105 MG PO TBCR
1.0000 | EXTENDED_RELEASE_TABLET | Freq: Two times a day (BID) | ORAL | Status: DC
  Administered 2023-10-24: 1 via ORAL

## 2023-10-24 MED ORDER — PANTOPRAZOLE SODIUM 40 MG PO TBEC
40.0000 mg | DELAYED_RELEASE_TABLET | Freq: Every day | ORAL | Status: DC
  Administered 2023-10-24 – 2023-10-31 (×8): 40 mg via ORAL
  Filled 2023-10-24 (×8): qty 1

## 2023-10-24 MED ORDER — FUROSEMIDE 10 MG/ML IJ SOLN
40.0000 mg | Freq: Once | INTRAMUSCULAR | Status: AC
  Administered 2023-10-24: 40 mg via INTRAVENOUS
  Filled 2023-10-24: qty 4

## 2023-10-24 MED ORDER — HEPARIN SODIUM (PORCINE) 5000 UNIT/ML IJ SOLN
5000.0000 [IU] | Freq: Three times a day (TID) | INTRAMUSCULAR | Status: DC
  Administered 2023-10-24 – 2023-10-31 (×20): 5000 [IU] via SUBCUTANEOUS
  Filled 2023-10-24 (×21): qty 1

## 2023-10-24 MED ORDER — SODIUM CHLORIDE 0.9 % IV SOLN: 250.0000 mL | INTRAVENOUS | Status: AC | PRN

## 2023-10-24 MED ORDER — ACETAMINOPHEN 325 MG PO TABS
650.0000 mg | ORAL_TABLET | ORAL | Status: DC | PRN
Start: 2023-10-24 — End: 2023-10-31
  Administered 2023-10-28: 650 mg via ORAL
  Filled 2023-10-24: qty 2

## 2023-10-24 MED ORDER — ALBUTEROL SULFATE (2.5 MG/3ML) 0.083% IN NEBU: 3.0000 mL | INHALATION_SOLUTION | Freq: Four times a day (QID) | RESPIRATORY_TRACT | Status: DC | PRN

## 2023-10-24 MED ORDER — FERROUS GLUCONATE 324 (38 FE) MG PO TABS
324.0000 mg | ORAL_TABLET | Freq: Every day | ORAL | Status: DC
  Administered 2023-10-25 – 2023-10-29 (×5): 324 mg via ORAL
  Filled 2023-10-24 (×7): qty 1

## 2023-10-24 NOTE — H&P (Signed)
History and Physical    Shane Chambers:[id number] DOB: 08-Feb-1961 DOA: 10/24/2023  PCP: [name], MD   Patient coming from: SNF  Chief Complaint: Dyspnea  HPI: Shane Chambers is a 62 y.o. male with medical history significant for hypertension, type 2 diabetes, COPD on baseline 2 L nasal cannula, dyslipidemia, CAD, diastolic CHF, hypothyroidism, depression, BPH, CKD 3 AAA, right BKA, and rectal cancer in 2022 who presented to the ED with worsening shortness of breath from SNF.  He was noted to have increasing shortness of breath as well as cough for the past 2 weeks that became worse this morning.  He was noted to be in the 80th percentile for oxygen saturations on his usual 2 L nasal cannula which improved with increased to 4 L nasal cannula.  He appears to be compliant on his home medications including torsemide.  Today he denies any chest pain, nausea, vomiting, fevers, or chills.   ED Course: Vital signs stable and patient on 4 L nasal cannula oxygen.  Leukocytosis of 11,600 noted and hemoglobin 8.4.  Potassium 5.4 and creatinine 3.66 with BUN of 63.  BNP 1066.  Chest x-ray with findings of CHF and patient has been started on IV Lasix as well as breathing treatment.  Review of Systems: Reviewed as noted above, otherwise negative.  Past Medical History:  Diagnosis Date   Anemia    Arthritis    CAD (coronary artery disease)    a. s/p CABG in 03/2020 with LIMA-LAD, RIMA-PL, RA-D1-RI-OM1   Cancer (HCC)    rectal   Cellulitis    COPD (chronic obstructive pulmonary disease) (HCC)    Depression    Diabetes mellitus without complication (HCC)    GERD (gastroesophageal reflux disease)    History of kidney stones    Hyperlipidemia 12/01/2019   Hypertension    Hypothyroidism    Ischemic cardiomyopathy    a. EF 20-25% by echo in 02/2020 b. at 40% by echo in 03/2020 c. EF normalized to 60-65% by echo in 04/2020   Myocardial infarction [facility])    Peripheral vascular disease (HCC)     Sleep apnea    Type 2 diabetes mellitus (HCC)     Past Surgical History:  Procedure Laterality Date   AMPUTATION Right 10/05/2022   Procedure: AMPUTATION BELOW KNEE;  Surgeon: Nadara Mustard, MD;  Location: [facility] OR;  Service: Orthopedics;  Laterality: Right;   AMPUTATION Right 11/14/2022   Procedure: AMPUTATION BELOW KNEE REVISION; WASHOUT;  Surgeon: Renford Dills, MD;  Location: [facility] ORS;  Service: Vascular;  Laterality: Right;   AMPUTATION Right 11/18/2022   Procedure: AMPUTATION BELOW KNEE REVISION AND CLOSURE;  Surgeon: Renford Dills, MD;  Location: [facility] ORS;  Service: Vascular;  Laterality: Right;   APPLICATION OF WOUND VAC Right 10/05/2022   Procedure: APPLICATION OF WOUND VAC;  Surgeon: Nadara Mustard, MD;  Location: [facility] OR;  Service: Orthopedics;  Laterality: Right;   BIOPSY  07/18/2021   Procedure: BIOPSY;  Surgeon: Lanelle Bal, DO;  Location: [facility];  Service: Endoscopy;;   BIOPSY  01/09/2022   Procedure: BIOPSY;  Surgeon: Lanelle Bal, DO;  [facility];  Service: Endoscopy;;   BIOPSY  06/29/2023   Procedure: BIOPSY;  Surgeon: Lanelle Bal, DO;  [facility]: [facility];  Service: Endoscopy;;   COLONOSCOPY WITH PROPOFOL N/A 07/18/2021   Carver: 15 millimeter polyp removed from the sigmoid colon, 5 mm polyp removed from sigmoid colon.  Nonbleeding internal  hemorrhoids.  Significant looping of the colon. sigmoid path showed invasive colonic adenocarcinoma involving tubular adenoma (invades to depth of 2mm, carcinoma 1mm from margin, no lymphovascular invasion, no poorly differentiated component.   COLONOSCOPY WITH PROPOFOL N/A 06/29/2023   Procedure: COLONOSCOPY WITH PROPOFOL;  Surgeon: Lanelle Bal, DO;  [facility];  Service: Endoscopy;  Laterality: N/A;  7:30 am, asa 3   CORONARY ARTERY BYPASS GRAFT N/A 03/13/2020   Procedure: CORONARY ARTERY BYPASS GRAFTING (CABG) times five using bilateral Internal mammary arteries and left  radial artery;  Surgeon: Linden Dolin, MD;  Location: [facility] OR;  Service: Open Heart Surgery;  Laterality: N/A;   DENTAL SURGERY     ESOPHAGOGASTRODUODENOSCOPY (EGD) WITH PROPOFOL N/A 07/18/2021   Carver: 1 gastric polyp status post biopsy, gastritis. gastric bx with slight chronic inflammation and no H.pyori. GEJ polypectomy with mild inflammation only   FLEXIBLE SIGMOIDOSCOPY N/A 08/26/2021   Carver: Nonbleeding internal hemorrhoids.  15 mm ulcers from previous polypectomy found in the rectum.  No evidence of previous polyp.  Located 5 to 8 cm from anal verge.   FLEXIBLE SIGMOIDOSCOPY N/A 01/09/2022   Procedure: FLEXIBLE SIGMOIDOSCOPY;  Surgeon: Lanelle Bal, DO;  [facility];  Service: Endoscopy;  Laterality: N/A;   POLYPECTOMY  07/18/2021   Procedure: POLYPECTOMY INTESTINAL;  Surgeon: Lanelle Bal, DO;  [facility];  Service: Endoscopy;;   POLYPECTOMY  06/29/2023   Procedure: POLYPECTOMY INTESTINAL;  Surgeon: Lanelle Bal, DO;  [facility];  Service: Endoscopy;;   RADIAL ARTERY HARVEST Left 03/13/2020   Procedure: RADIAL ARTERY HARVEST,;  Surgeon: Linden Dolin, MD;  Location: [facility] OR;  Service: Open Heart Surgery;  Laterality: Left;   RIGHT/LEFT HEART CATH AND CORONARY ANGIOGRAPHY N/A 03/07/2020   Procedure: RIGHT/LEFT HEART CATH AND CORONARY ANGIOGRAPHY;  Surgeon: Tonny Bollman, MD;  Location: [facility] INVASIVE CV LAB;  Service: Cardiovascular;  Laterality: N/A;   STUMP REVISION Right 12/17/2022   Procedure: RIGHT BELOW KNEE AMPUTATION REVISION;  Surgeon: Nadara Mustard, MD;  Location: [facility] OR;  Service: Orthopedics;  Laterality: Right;   SUBMUCOSAL LIFTING INJECTION  01/09/2022   Procedure: SUBMUCOSAL LIFTING INJECTION;  Surgeon: Lanelle Bal, DO;  [facility];  Service: Endoscopy;;   SUBMUCOSAL TATTOO INJECTION  01/09/2022   Procedure: SUBMUCOSAL TATTOO INJECTION;  Surgeon: Lanelle Bal, DO;  [facility];  Service:  Endoscopy;;   TEE WITHOUT CARDIOVERSION N/A 03/13/2020   Procedure: TRANSESOPHAGEAL ECHOCARDIOGRAM (TEE);  Surgeon: Linden Dolin, MD;  Location: [facility] OR;  Service: Open Heart Surgery;  Laterality: N/A;     reports that he quit smoking about 28 years ago. His smoking use included cigarettes. He has never used smokeless tobacco. He reports that he does not currently use alcohol. He reports that he does not use drugs.  Allergies  Allergen Reactions   Ceftriaxone Anaphylaxis   Ace Inhibitors Swelling and Cough    (Not on MAR at [name])   Haloperidol And Related     Do NOT give anti-psychotics due to risk of  Torsades and QT prolongation (per Dr. Teena Irani request)   Other Itching    [name]   Shellfish Allergy     Listed on MAR   Penicillins Itching and Rash    Has patient had a PCN reaction causing immediate rash, facial/tongue/throat swelling, SOB or lightheadedness with hypotension: Unknown Has patient had a PCN reaction causing severe rash  involving mucus membranes or skin necrosis: Unknown Has patient had a PCN reaction that required hospitalization: Unknown Has patient had a PCN reaction occurring within the last 10 years: Unknown If all of the above answers are "NO", then may proceed with Cephalosporin use.  Tolerated ancef (12-17-22)     Family History  Problem Relation Age of Onset   Hypertension Mother     Prior to Admission medications   Medication Sig Start Date End Date Taking? Authorizing Provider  acetaminophen (TYLENOL) 500 MG tablet Take 1,000 mg by mouth every 8 (eight) hours as needed for fever or moderate pain.    [provider]  albuterol (VENTOLIN HFA) 108 (90 Base) MCG/ACT inhaler Inhale 2 puffs into the lungs every 6 (six) hours as needed for wheezing or shortness of breath. 05/02/22   Johnson, Clanford L, MD  amLODipine (NORVASC) 10 MG tablet Take 10 mg by mouth daily. 06/28/23   [provider]   aspirin 81 MG EC tablet Take 1 tablet (81 mg total) by mouth daily with breakfast. 11/29/20   Shon Hale, MD  atorvastatin (LIPITOR) 40 MG tablet Take 1 tablet (40 mg total) by mouth at bedtime. 11/28/22   Gillis Santa, MD  AUVELITY 45-105 MG TBCR Take 1 tablet by mouth 2 (two) times daily. 07/22/23   [provider]  bisacodyl 5 MG EC tablet Take 1 tablet (5 mg total) by mouth daily as needed for moderate constipation. 05/28/23   Lanelle Bal, DO  budesonide-formoterol [facility]) 160-4.5 MCG/ACT inhaler Inhale 2 puffs into the lungs in the morning and at bedtime. 05/24/20   Oretha Milch, MD  carvedilol (COREG) 25 MG tablet Take 1 tablet (25 mg total) by mouth 2 (two) times daily with a meal. 04/02/23   Meredeth Ide, MD  cetirizine (ZYRTEC) 10 MG chewable tablet Chew 10 mg by mouth daily.    [provider]  ferrous gluconate (FERGON) 324 MG tablet Take 324 mg by mouth daily.    [provider]  GABAPENTIN PO Take by mouth at bedtime. One capsule 300 mg + 600 mg capsule at bedtime = total of 900 mg at bedtime. 07/06/23   [provider]  hydrALAZINE (APRESOLINE) 100 MG tablet Take 1 tablet (100 mg total) by mouth every 8 (eight) hours. 04/02/23   Meredeth Ide, MD  insulin aspart (NOVOLOG FLEXPEN) 100 UNIT/ML FlexPen 0-15 Units, Subcutaneous, 3 times daily with meals CBG < 70: implement hypoglycemia protocol-call MD CBG 70 - 120: 0 units CBG 121 - 150: 2 units CBG 151 - 200: 3 units CBG 201 - 250: 5 units CBG 251 - 300: 8 units CBG 301 - 350: 11 units CBG 351 - 400: 15 units CBG > 400: 10/17/22   Ghimire, Werner Lean, MD  ipratropium-albuterol (DUONEB) 0.5-2.5 (3) MG/3ML SOLN Take 3 mLs by nebulization every 6 (six) hours as needed. Patient taking differently: Take 3 mLs by nebulization every 6 (six) hours as needed (SOB/wheezing). 10/17/22   Ghimire, Werner Lean, MD  levothyroxine (SYNTHROID) 75 MCG tablet Take 75 mcg by mouth daily before breakfast.     [provider]  lubiprostone (AMITIZA) 8 MCG capsule Take 1 capsule (8 mcg total) by mouth 2 (two) times daily with a meal. 09/29/23   Gelene Mink, NP  mirtazapine (REMERON) 45 MG tablet Take 45 mg by mouth at bedtime. 06/28/23   [provider]  omeprazole (PRILOSEC) 40 MG capsule Take 40 mg by mouth in the morning.  01/01/22   [provider]  OVER THE COUNTER MEDICATION Luliconazole external cream 1% apply to sacrum bid.    [provider]  polyethylene glycol (MIRALAX / GLYCOLAX) 17 g packet Take 17 g by mouth daily as needed for moderate constipation.    [provider]  polyvinyl alcohol (LIQUIFILM TEARS) 1.4 % ophthalmic solution Place 1 drop into both eyes daily.    [provider]  tamsulosin (FLOMAX) 0.4 MG CAPS capsule Take 0.8 mg by mouth every evening.    [provider]  thiamine (VITAMIN B-1) 100 MG tablet Take 1 tablet (100 mg total) by mouth daily. 10/18/22   Ghimire, Werner Lean, MD  torsemide (DEMADEX) 20 MG tablet Take 1 tablet (20 mg total) by mouth daily. 04/03/23   Meredeth Ide, MD  Vitamin D, Ergocalciferol, (DRISDOL) 1.25 MG (50000 UNIT) CAPS capsule Take 50,000 Units by mouth every 7 (seven) days. (Mondays)    [provider]    Physical Exam: Vitals:   10/24/23 1009 10/24/23 1011 10/24/23 1055 10/24/23 1115  BP:    (!) 120/55  Pulse:  66  62  Resp:  14  19  Temp:  98 F (36.7 C)    TempSrc:  Oral    SpO2:  93% 95% 91%  Weight: 132.9 kg     Height: 5\' 10"  (1.778 m)       Constitutional: NAD, calm, comfortable, obese Vitals:   10/24/23 1009 10/24/23 1011 10/24/23 1055 10/24/23 1115  BP:    (!) 120/55  Pulse:  66  62  Resp:  14  19  Temp:  98 F (36.7 C)    TempSrc:  Oral    SpO2:  93% 95% 91%  Weight: 132.9 kg     Height: 5\' 10"  (1.778 m)      Eyes: lids and conjunctivae normal Neck: normal, supple Respiratory: clear to auscultation bilaterally. Normal respiratory effort. No  accessory muscle use.  4 L nasal cannula Cardiovascular: Regular rate and rhythm, no murmurs. Abdomen: no tenderness, no distention. Bowel sounds positive.  Musculoskeletal: Right BKA Skin: no rashes, lesions, ulcers.  Psychiatric: Flat affect  Labs on Admission: I have personally reviewed following labs and imaging studies  CBC: Recent Labs  Lab 10/24/23 1007  WBC 11.6*  NEUTROABS 9.5*  HGB 8.4*  HCT 28.3*  MCV 102.9*  PLT 246   Basic Metabolic Panel: Recent Labs  Lab 10/24/23 1007  NA 138  K 5.4*  CL 102  CO2 26  GLUCOSE 141*  BUN 63*  CREATININE 3.66*  CALCIUM 8.5*   GFR: Estimated Creatinine Clearance: 29.4 mL/min (A) (by C-G formula based on SCr of 3.66 mg/dL (H)). Liver Function Tests: No results for input(s): "AST", "ALT", "ALKPHOS", "BILITOT", "PROT", "ALBUMIN" in the last 168 hours. No results for input(s): "LIPASE", "AMYLASE" in the last 168 hours. No results for input(s): "AMMONIA" in the last 168 hours. Coagulation Profile: No results for input(s): "INR", "PROTIME" in the last 168 hours. Cardiac Enzymes: No results for input(s): "CKTOTAL", "CKMB", "CKMBINDEX", "TROPONINI" in the last 168 hours. BNP (last 3 results) No results for input(s): "PROBNP" in the last 8760 hours. HbA1C: No results for input(s): "HGBA1C" in the last 72 hours. CBG: No results for input(s): "GLUCAP" in the last 168 hours. Lipid Profile: No results for input(s): "CHOL", "HDL", "LDLCALC", "TRIG", "CHOLHDL", "LDLDIRECT" in the last 72 hours. Thyroid Function Tests: No results for input(s): "TSH", "T4TOTAL", "FREET4", "T3FREE", "THYROIDAB" in the last 72 hours. Anemia Panel:  No results for input(s): "VITAMINB12", "FOLATE", "FERRITIN", "TIBC", "IRON", "RETICCTPCT" in the last 72 hours. Urine analysis:    Component Value Date/Time   COLORURINE YELLOW (A) 07/30/2023 1650   APPEARANCEUR CLEAR (A) 07/30/2023 1650   LABSPEC 1.011 07/30/2023 1650   PHURINE 5.0 07/30/2023 1650    GLUCOSEU NEGATIVE 07/30/2023 1650   HGBUR NEGATIVE 07/30/2023 1650   BILIRUBINUR NEGATIVE 07/30/2023 1650   KETONESUR NEGATIVE 07/30/2023 1650   PROTEINUR 100 (A) 07/30/2023 1650   NITRITE NEGATIVE 07/30/2023 1650   LEUKOCYTESUR NEGATIVE 07/30/2023 1650    Radiological Exams on Admission: DG Chest Port 1 View Result Date: 10/24/2023 CLINICAL DATA:  sob EXAM: PORTABLE CHEST - 1 VIEW COMPARISON:  03/29/2023. FINDINGS: Cardiac silhouette is prominent. There is pulmonary interstitial prominence with vascular congestion. No focal consolidation. No pneumothorax. Small bilateral pleural effusions. There are median sternotomy wires. IMPRESSION: Findings suggest CHF. Electronically Signed   By: Layla Maw M.D.   On: 10/24/2023 10:44    EKG: Independently reviewed. SR 68 bpm.  Assessment/Plan Principal Problem:   Acute on chronic hypoxic respiratory failure (HCC) Active Problems:   Essential hypertension   COPD (chronic obstructive pulmonary disease) (HCC)   Type 2 diabetes mellitus without complications (HCC)   CKD stage 3a, GFR 45-59 ml/min (HCC)   BPH (benign prostatic hyperplasia)   Normocytic anemia   AKI (acute kidney injury) (HCC)   Morbid obesity (HCC)   Acquired hypothyroidism   Dyslipidemia    Acute on chronic hypoxemic respiratory failure secondary to acute diastolic CHF exacerbation -2D echocardiogram 06/2023 showed EF 60-65% -Plan to get limited 2D echocardiogram -IV diuresis with Lasix 40 mg twice daily -Wean oxygen to baseline 2 L nasal cannula  AKI on CKD stage IIIa -Likely cardiorenal secondary to volume overload from above -Continue to monitor strict I's and O's -Continue to diurese and avoid other nephrotoxic agents  Mild hyperkalemia -Monitor with IV diuresis -Give 1 dose Lokelma -Recheck labs in a.m.  Hypertension -Hold amlodipine, Toradol, and torsemide -Monitor carefully on IV diuresis  COPD -Bronchodilators as needed -Currently without  exacerbation  Dyslipidemia -Continue statin  Hypothyroidism -Levothyroxine  Type 2 diabetes -SSI -Carb modified diet  Iron deficiency anemia -Iron supplementation  BPH -Flomax  Morbid obesity -BMI 42.04   DVT prophylaxis: Heparin Code Status: Full Family Communication: None at bedside Disposition Plan: Admit for diuresis Consults called: None Admission status: Inpatient, telemetry  Severity of Illness: The appropriate patient status for this patient is INPATIENT. Inpatient status is judged to be reasonable and necessary in order to provide the required intensity of service to ensure the patient's safety. The patient's presenting symptoms, physical exam findings, and initial radiographic and laboratory data in the context of their chronic comorbidities is felt to place them at high risk for further clinical deterioration. Furthermore, it is not anticipated that the patient will be medically stable for discharge from the hospital within 2 midnights of admission.   * I certify that at the point of admission it is my clinical judgment that the patient will require inpatient hospital care spanning beyond 2 midnights from the point of admission due to high intensity of service, high risk for further deterioration and high frequency of surveillance required.*   Kaoir Loree D Adyn Serna DO [facility]  If 7PM-7AM, please contact night-coverage [email or web address]  10/24/2023, 11:55 AM

## 2023-10-24 NOTE — Progress Notes (Addendum)
  Echocardiogram 2D Echocardiogram has been performed.  Leda Roys RDCS 10/24/2023, 2:18 PM

## 2023-10-24 NOTE — ED Triage Notes (Signed)
Pt arrived [facility] from [facility] for SOB. Pt was 88% on 2 lpm upon arrival. Pt usually is on RA.  Pt has a foley cath in place.

## 2023-10-24 NOTE — ED Provider Notes (Signed)
[place] EMERGENCY DEPARTMENT AT [facility] Provider Note   CSN: [id number] Arrival date & time: 10/24/23  0940     History  Chief Complaint  Patient presents with   Shortness of Breath    SLOAN CHERVENAK is a 62 y.o. male.He has  PMH ofhypertension, hyperlipidemia, diabetes mellitus, COPD, CAD, diastolic CHF, iron deficiency anemia, hypothyroidism, depression, BPH, CKD-3A, s/p of right BKA, rectal cancer 2022 (s/p of surgery, no chemo or radiation therapy)   Resents to the ER from [facility] via EMS today for cough and increasing shortness of breath for the past 2 weeks that became worse this morning.  History is taken from patient as well as from EMS.  Both report about 2 weeks ago started having cough with occasional sputum production, he is not normally on oxygen but nursing home started him on 2 L of oxygen when this started due to difficulty breathing, today when he reported worsening symptoms the facility checked his oxygen and noted he on 2 L was between 86 and 88 percent SpO2.  EMS was called and found oxygen was 88%, started on 4 L of oxygen and transported him to the ER, heart rate has been normal, oxygen has been 94% on the 4 L and patient states he had some improvement of his symptoms.  He does note this feels similar to when he was admitted to hospital earlier this year with heart failure exacerbation.  Has noted slightly increased to swelling in his lower extremities.  He is a former smoker.  Denies chest pain, denies nausea or vomiting, denies fevers.   HPI     Home Medications Prior to Admission medications   Medication Sig Start Date End Date Taking? Authorizing Provider  acetaminophen (TYLENOL) 500 MG tablet Take 1,000 mg by mouth every 8 (eight) hours as needed for fever or moderate pain.    [provider]  albuterol (VENTOLIN HFA) 108 (90 Base) MCG/ACT inhaler Inhale 2 puffs into the lungs every 6 (six) hours as needed for wheezing or  shortness of breath. 05/02/22   Johnson, Clanford L, MD  amLODipine (NORVASC) 10 MG tablet Take 10 mg by mouth daily. 06/28/23   [provider]  aspirin 81 MG EC tablet Take 1 tablet (81 mg total) by mouth daily with breakfast. 11/29/20   Shon Hale, MD  atorvastatin (LIPITOR) 40 MG tablet Take 1 tablet (40 mg total) by mouth at bedtime. 11/28/22   Gillis Santa, MD  AUVELITY 45-105 MG TBCR Take 1 tablet by mouth 2 (two) times daily. 07/22/23   [provider]  bisacodyl 5 MG EC tablet Take 1 tablet (5 mg total) by mouth daily as needed for moderate constipation. 05/28/23   Lanelle Bal, DO  budesonide-formoterol [facility]) 160-4.5 MCG/ACT inhaler Inhale 2 puffs into the lungs in the morning and at bedtime. 05/24/20   Oretha Milch, MD  carvedilol (COREG) 25 MG tablet Take 1 tablet (25 mg total) by mouth 2 (two) times daily with a meal. 04/02/23   Meredeth Ide, MD  cetirizine (ZYRTEC) 10 MG chewable tablet Chew 10 mg by mouth daily.    [provider]  ferrous gluconate (FERGON) 324 MG tablet Take 324 mg by mouth daily.    [provider]  GABAPENTIN PO Take by mouth at bedtime. One capsule 300 mg + 600 mg capsule at bedtime = total of 900 mg at bedtime. 07/06/23   [provider]  hydrALAZINE (APRESOLINE) 100 MG tablet  Take 1 tablet (100 mg total) by mouth every 8 (eight) hours. 04/02/23   Meredeth Ide, MD  insulin aspart (NOVOLOG FLEXPEN) 100 UNIT/ML FlexPen 0-15 Units, Subcutaneous, 3 times daily with meals CBG < 70: implement hypoglycemia protocol-call MD CBG 70 - 120: 0 units CBG 121 - 150: 2 units CBG 151 - 200: 3 units CBG 201 - 250: 5 units CBG 251 - 300: 8 units CBG 301 - 350: 11 units CBG 351 - 400: 15 units CBG > 400: 10/17/22   Ghimire, Werner Lean, MD  ipratropium-albuterol (DUONEB) 0.5-2.5 (3) MG/3ML SOLN Take 3 mLs by nebulization every 6 (six) hours as needed. Patient taking differently: Take 3 mLs by nebulization every 6 (six)  hours as needed (SOB/wheezing). 10/17/22   Ghimire, Werner Lean, MD  levothyroxine (SYNTHROID) 75 MCG tablet Take 75 mcg by mouth daily before breakfast.    [provider]  lubiprostone (AMITIZA) 8 MCG capsule Take 1 capsule (8 mcg total) by mouth 2 (two) times daily with a meal. 09/29/23   Gelene Mink, NP  mirtazapine (REMERON) 45 MG tablet Take 45 mg by mouth at bedtime. 06/28/23   [provider]  omeprazole (PRILOSEC) 40 MG capsule Take 40 mg by mouth in the morning. 01/01/22   [provider]  OVER THE COUNTER MEDICATION Luliconazole external cream 1% apply to sacrum bid.    [provider]  polyethylene glycol (MIRALAX / GLYCOLAX) 17 g packet Take 17 g by mouth daily as needed for moderate constipation.    [provider]  polyvinyl alcohol (LIQUIFILM TEARS) 1.4 % ophthalmic solution Place 1 drop into both eyes daily.    [provider]  tamsulosin (FLOMAX) 0.4 MG CAPS capsule Take 0.8 mg by mouth every evening.    [provider]  thiamine (VITAMIN B-1) 100 MG tablet Take 1 tablet (100 mg total) by mouth daily. 10/18/22   Ghimire, Werner Lean, MD  torsemide (DEMADEX) 20 MG tablet Take 1 tablet (20 mg total) by mouth daily. 04/03/23   Meredeth Ide, MD  Vitamin D, Ergocalciferol, (DRISDOL) 1.25 MG (50000 UNIT) CAPS capsule Take 50,000 Units by mouth every 7 (seven) days. (Mondays)    [provider]      Allergies    Ceftriaxone, Ace inhibitors, Haloperidol and related, Other, Shellfish allergy, and Penicillins    Review of Systems   Review of Systems  Physical Exam Updated Vital Signs BP (!) 140/74   Pulse 66   Temp 98 F (36.7 C) (Oral)   Resp 14   Ht 5\' 10"  (1.778 m)   Wt 132.9 kg   SpO2 95%   BMI 42.04 kg/m  Physical Exam Vitals and nursing note reviewed.  Constitutional:      General: He is not in acute distress.    Appearance: He is well-developed.  HENT:     Head: Normocephalic and atraumatic.  Eyes:      Conjunctiva/sclera: Conjunctivae normal.  Cardiovascular:     Rate and Rhythm: Normal rate and regular rhythm.     Heart sounds: No murmur heard. Pulmonary:     Effort: Pulmonary effort is normal. No respiratory distress.     Breath sounds: Normal breath sounds.     Comments: Mild scattered wheezes rhonchi Abdominal:     Palpations: Abdomen is soft.     Tenderness: There is no abdominal tenderness.  Musculoskeletal:        General: No tenderness.     Cervical back: Neck supple.  Left lower leg: Edema present.  Skin:    General: Skin is warm and dry.     Capillary Refill: Capillary refill takes less than 2 seconds.  Neurological:     General: No focal deficit present.     Mental Status: He is alert and oriented to person, place, and time.  Psychiatric:        Mood and Affect: Mood normal.     ED Results / Procedures / Treatments   Labs (all labs ordered are listed, but only abnormal results are displayed) Labs Reviewed  BASIC METABOLIC PANEL - Abnormal; Notable for the following components:      Result Value   Potassium 5.4 (*)    Glucose, Bld 141 (*)    BUN 63 (*)    Creatinine, Ser 3.66 (*)    Calcium 8.5 (*)    GFR, Estimated 18 (*)    All other components within normal limits  BRAIN NATRIURETIC PEPTIDE - Abnormal; Notable for the following components:   B Natriuretic Peptide 1,066.0 (*)    All other components within normal limits  CBC WITH DIFFERENTIAL/PLATELET - Abnormal; Notable for the following components:   WBC 11.6 (*)    RBC 2.75 (*)    Hemoglobin 8.4 (*)    HCT 28.3 (*)    MCV 102.9 (*)    MCHC 29.7 (*)    nRBC 0.3 (*)    Neutro Abs 9.5 (*)    Abs Immature Granulocytes 0.59 (*)    All other components within normal limits  RESP PANEL BY RT-PCR (RSV, FLU A&B, COVID)  RVPGX2  TROPONIN I (HIGH SENSITIVITY)    EKG EKG Interpretation Date/Time:  Saturday October 24 2023 10:14:25 EST Ventricular Rate:  68 PR Interval:  188 QRS  Duration:  115 QT Interval:  433 QTC Calculation: 461 R Axis:   36  Text Interpretation: Sinus rhythm Nonspecific intraventricular conduction delay Abnormal inferior Q waves No significant change since prior 9/24 Confirmed by Meridee Score [id number]) on 10/24/2023 10:17:30 AM  Radiology DG Chest Port 1 View Result Date: 10/24/2023 CLINICAL DATA:  sob EXAM: PORTABLE CHEST - 1 VIEW COMPARISON:  03/29/2023. FINDINGS: Cardiac silhouette is prominent. There is pulmonary interstitial prominence with vascular congestion. No focal consolidation. No pneumothorax. Small bilateral pleural effusions. There are median sternotomy wires. IMPRESSION: Findings suggest CHF. Electronically Signed   By: Layla Maw M.D.   On: 10/24/2023 10:44    Procedures Procedures    Medications Ordered in ED Medications  ipratropium-albuterol (DUONEB) 0.5-2.5 (3) MG/3ML nebulizer solution 3 mL (3 mLs Nebulization Given 10/24/23 1055)  furosemide (LASIX) injection 40 mg (40 mg Intravenous Given 10/24/23 1112)    ED Course/ Medical Decision Making/ A&P Clinical Course as of 10/24/23 1124  Sat Oct 24, 2023  3152 62 year old male brought in from his facility by ambulance for cough shortness of breath.  He said he has been coughing up yellow stuff for the last 2 weeks.  Did require oxygen during transport.  Getting labs and imaging.  Anticipate will need admission for further workup [MB]    Clinical Course User Index [MB] Terrilee Files, MD                                 Medical Decision Making This patient presents to the ED for concern of SOB, this involves an extensive number of treatment options, and is a complaint that carries with it  a high risk of complications and morbidity.  The differential diagnosis includes CHF exacerbation, COPD exacerbation, pneumonia, ACS, PE, symptomatic anemia, other   Co morbidities that complicate the patient evaluation :   Congestive heart failure, CAD, chronic anemia,  diabetes, right BKA   Additional history obtained:  Additional history obtained from EMR External records from outside source obtained and reviewed including prior hospital and cardiology notes, previous labs   Lab Tests:  I Ordered, and personally interpreted labs.  The pertinent results include: CBC shows anemia with baseline hemoglobin 8.4, mild leukocytosis 11.6; troponin normal at 14, BNP is elevated to thousand 66, BMP shows mild hyperkalemia, worsening of patient's chronic renal failure   Imaging Studies ordered:  I ordered imaging studies including chest x-ray which shows pulmonary vascular congestion I independently visualized and interpreted imaging within scope of identifying emergent findings  I agree with the radiologist interpretation   Cardiac Monitoring: / EKG:  The patient was maintained on a cardiac monitor.  I personally viewed and interpreted the cardiac monitored which showed an underlying rhythm of: Sinus rhythm   Consultations Obtained:  I requested consultation with the hospitalist Dr. Sherryll Burger,  and discussed lab and imaging findings as well as pertinent plan - they recommend: admission for diuresis    Problem List / ED Course / Critical interventions / Medication management  Acute on chronic respiratory failure requiring 4 L oxygen at this time likely due to CHF exacerbation given chest x-ray findings and elevated BNP with lower extremity edema.  No chest pain or EKG findings to suggest ACS especially with normal troponin as cause for his heart failure exacerbation.  No pneumonia on his chest x-ray .  He is given DuoNeb when he came in as he has a mild wheezing as well and does have COPD, he is satting between 93 to 95% on 4 L nasal cannula.  Sitting in the stretcher and is spontaneously awake.  He is given Lasix 40 mg IV and will be hospitalized for likely heart failure exacerbation.  His renal failure is worse than his baseline but he appears to be volume  overloaded so this will be due to cardiorenal syndrome I ordered medication including lasix  for volume overload   I have reviewed the patients home medicines and have made adjustments as needed   Social Determinants of Health:  PT is in SNF       Amount and/or Complexity of Data Reviewed Labs: ordered. Radiology: ordered.  Risk Prescription drug management. Decision regarding hospitalization.           Final Clinical Impression(s) / ED Diagnoses Final diagnoses:  Acute on chronic diastolic congestive heart failure (HCC)  Acute on chronic respiratory failure with hypoxia [facility])    Rx / DC Orders ED Discharge Orders     None         Josem Kaufmann 10/24/23 1124    Terrilee Files, MD 10/24/23 1659

## 2023-10-25 DIAGNOSIS — J9621 Acute and chronic respiratory failure with hypoxia: Secondary | ICD-10-CM | POA: Diagnosis not present

## 2023-10-25 LAB — BASIC METABOLIC PANEL
Anion gap: 12 (ref 5–15)
BUN: 68 mg/dL — ABNORMAL HIGH (ref 6–20)
CO2: 22 mmol/L (ref 22–32)
Calcium: 8.2 mg/dL — ABNORMAL LOW (ref 8.9–10.3)
Chloride: 101 mmol/L (ref 98–111)
Creatinine, Ser: 3.63 mg/dL — ABNORMAL HIGH (ref 0.61–1.24)
GFR, Estimated: 18 mL/min — ABNORMAL LOW (ref >60)
Glucose, Bld: 130 mg/dL — ABNORMAL HIGH (ref 70–99)
Potassium: 5 mmol/L (ref 3.5–5.1)
Sodium: 135 mmol/L (ref 135–145)

## 2023-10-25 MED ORDER — POLYETHYLENE GLYCOL 3350 17 G PO PACK
17.0000 g | PACK | Freq: Every day | ORAL | Status: DC
  Administered 2023-10-25 – 2023-10-29 (×4): 17 g via ORAL
  Filled 2023-10-25 (×7): qty 1

## 2023-10-25 MED ORDER — FUROSEMIDE 10 MG/ML IJ SOLN
80.0000 mg | Freq: Two times a day (BID) | INTRAMUSCULAR | Status: DC
  Administered 2023-10-25 – 2023-10-31 (×13): 80 mg via INTRAVENOUS
  Filled 2023-10-25 (×14): qty 8

## 2023-10-25 NOTE — Progress Notes (Signed)
PROGRESS NOTE    Shane Chambers  AOZ:[id number] DOB: 1961-06-10 DOA: 10/24/2023 PCP: [name], MD   Brief Narrative:    Shane Chambers is a 62 y.o. male with medical history significant for hypertension, type 2 diabetes, COPD on baseline 2 L nasal cannula, dyslipidemia, CAD, diastolic CHF, hypothyroidism, depression, BPH, CKD 3 AAA, right BKA, and rectal cancer in 2022 who presented to the ED with worsening shortness of breath from SNF.   Assessment & Plan:   Principal Problem:   Acute on chronic hypoxic respiratory failure (HCC) Active Problems:   Essential hypertension   COPD (chronic obstructive pulmonary disease) (HCC)   Type 2 diabetes mellitus without complications (HCC)   CKD stage 3a, GFR 45-59 ml/min (HCC)   BPH (benign prostatic hyperplasia)   Normocytic anemia   AKI (acute kidney injury) (HCC)   Morbid obesity (HCC)   Acquired hypothyroidism   Dyslipidemia  Assessment and Plan:   Acute on chronic hypoxemic respiratory failure secondary to acute diastolic CHF exacerbation -2D echocardiogram 06/2023 showed EF 60-65% -Repeat limited 2D echocardiogram 12/14 showing LVEF 55-60% with grade 3 diastolic dysfunction -IV diuresis increased to 80 mg twice daily from 40 mg given and efficient diuresis -Wean oxygen to baseline 2 L nasal cannula -Consider cardiology evaluation by a.m.   AKI on CKD stage IIIa -Currently stable -Likely cardiorenal secondary to volume overload from above -Continue to monitor strict I's and O's -Continue to diurese and avoid other nephrotoxic agents   Hypertension -Hold amlodipine, Toradol, and torsemide -Monitor carefully on IV diuresis   COPD -Bronchodilators as needed -Currently without exacerbation   Dyslipidemia -Continue statin   Hypothyroidism -Levothyroxine   Type 2 diabetes -SSI -Carb modified diet   Iron deficiency anemia -Iron supplementation   BPH with chronic Foley catheter -Flomax   Morbid obesity -BMI  42.04   DVT prophylaxis:Heparin Code Status: Full Family Communication: None at bedside Disposition Plan:  Status is: Inpatient Remains inpatient appropriate because: Need for IV diuresis.  Consultants:  None  Procedures:  None  Antimicrobials:  None   Subjective: Patient seen and evaluated today with ongoing shortness of breath and fatigue.  He denies any chest pain or palpitations.  He states he is tired and did not sleep well overnight.  Objective: Vitals:   10/24/23 1819 10/24/23 2003 10/24/23 2009 10/25/23 0646  BP: (!) 177/49  129/60 (!) 140/62  Pulse: 66  69 68  Resp:  17 20 20   Temp: 98.6 F (37 C)  97.8 F (36.6 C) 98.2 F (36.8 C)  TempSrc: Oral  Oral Oral  SpO2: 94%  93% 93%  Weight:    126.2 kg  Height:        Intake/Output Summary (Last 24 hours) at 10/25/2023 1054 Last data filed at 10/25/2023 0900 Gross per 24 hour  Intake 480 ml  Output 850 ml  Net -370 ml   Filed Weights   10/24/23 1009 10/25/23 0646  Weight: 132.9 kg 126.2 kg    Examination:  General exam: Appears calm and comfortable  Respiratory system: Clear to auscultation. Respiratory effort normal.  5 L nasal cannula. Cardiovascular system: S1 & S2 heard, RRR.  Gastrointestinal system: Abdomen is soft Central nervous system: Alert and awake Extremities: Right BKA Skin: No significant lesions noted Psychiatry: Flat affect. Foley with clear, yellow urine output noted    Data Reviewed: I have personally reviewed following labs and imaging studies  CBC: Recent Labs  Lab 10/24/23 1007  WBC 11.6*  NEUTROABS  9.5*  HGB 8.4*  HCT 28.3*  MCV 102.9*  PLT 246   Basic Metabolic Panel: Recent Labs  Lab 10/24/23 1007 10/25/23 0446  NA 138 135  K 5.4* 5.0  CL 102 101  CO2 26 22  GLUCOSE 141* 130*  BUN 63* 68*  CREATININE 3.66* 3.63*  CALCIUM 8.5* 8.2*   GFR: Estimated Creatinine Clearance: 28.9 mL/min (A) (by C-G formula based on SCr of 3.63 mg/dL (H)). Liver  Function Tests: No results for input(s): "AST", "ALT", "ALKPHOS", "BILITOT", "PROT", "ALBUMIN" in the last 168 hours. No results for input(s): "LIPASE", "AMYLASE" in the last 168 hours. No results for input(s): "AMMONIA" in the last 168 hours. Coagulation Profile: No results for input(s): "INR", "PROTIME" in the last 168 hours. Cardiac Enzymes: No results for input(s): "CKTOTAL", "CKMB", "CKMBINDEX", "TROPONINI" in the last 168 hours. BNP (last 3 results) No results for input(s): "PROBNP" in the last 8760 hours. HbA1C: No results for input(s): "HGBA1C" in the last 72 hours. CBG: No results for input(s): "GLUCAP" in the last 168 hours. Lipid Profile: No results for input(s): "CHOL", "HDL", "LDLCALC", "TRIG", "CHOLHDL", "LDLDIRECT" in the last 72 hours. Thyroid Function Tests: No results for input(s): "TSH", "T4TOTAL", "FREET4", "T3FREE", "THYROIDAB" in the last 72 hours. Anemia Panel: No results for input(s): "VITAMINB12", "FOLATE", "FERRITIN", "TIBC", "IRON", "RETICCTPCT" in the last 72 hours. Sepsis Labs: No results for input(s): "PROCALCITON", "LATICACIDVEN" in the last 168 hours.  Recent Results (from the past 240 hours)  Resp panel by RT-PCR (RSV, Flu A&B, Covid) Anterior Nasal Swab     Status: None   Collection Time: 10/24/23 10:11 AM   Specimen: Anterior Nasal Swab  Result Value Ref Range Status   SARS Coronavirus 2 by RT PCR NEGATIVE NEGATIVE Final    Comment: (NOTE) SARS-CoV-2 target nucleic acids are NOT DETECTED.  The SARS-CoV-2 RNA is generally detectable in upper respiratory specimens during the acute phase of infection. The lowest concentration of SARS-CoV-2 viral copies this assay can detect is 138 copies/mL. A negative result does not preclude SARS-Cov-2 infection and should not be used as the sole basis for treatment or other patient management decisions. A negative result may occur with  improper specimen collection/handling, submission of specimen other than  nasopharyngeal swab, presence of viral mutation(s) within the areas targeted by this assay, and inadequate number of viral copies(<138 copies/mL). A negative result must be combined with clinical observations, patient history, and epidemiological information. The expected result is Negative.  Fact Sheet for Patients:  BloggerCourse.com  Fact Sheet for Healthcare Providers:  SeriousBroker.it  This test is no t yet approved or cleared by the [place] FDA and  has been authorized for detection and/or diagnosis of SARS-CoV-2 by FDA under an Emergency Use Authorization (EUA). This EUA will remain  in effect (meaning this test can be used) for the duration of the COVID-19 declaration under Section 564(b)(1) of the Act, 21 U.S.C.section 360bbb-3(b)(1), unless the authorization is terminated  or revoked sooner.       Influenza A by PCR NEGATIVE NEGATIVE Final   Influenza B by PCR NEGATIVE NEGATIVE Final    Comment: (NOTE) The Xpert Xpress SARS-CoV-2/FLU/RSV plus assay is intended as an aid in the diagnosis of influenza from Nasopharyngeal swab specimens and should not be used as a sole basis for treatment. Nasal washings and aspirates are unacceptable for Xpert Xpress SARS-CoV-2/FLU/RSV testing.  Fact Sheet for Patients: BloggerCourse.com  Fact Sheet for Healthcare Providers: SeriousBroker.it  This test is not yet approved or  cleared by the [place] and has been authorized for detection and/or diagnosis of SARS-CoV-2 by FDA under an Emergency Use Authorization (EUA). This EUA will remain in effect (meaning this test can be used) for the duration of the COVID-19 declaration under Section 564(b)(1) of the Act, 21 U.S.C. section 360bbb-3(b)(1), unless the authorization is terminated or revoked.     Resp Syncytial Virus by PCR NEGATIVE NEGATIVE Final    Comment:  (NOTE) Fact Sheet for Patients: BloggerCourse.com  Fact Sheet for Healthcare Providers: SeriousBroker.it  This test is not yet approved or cleared by the [place] FDA and has been authorized for detection and/or diagnosis of SARS-CoV-2 by FDA under an Emergency Use Authorization (EUA). This EUA will remain in effect (meaning this test can be used) for the duration of the COVID-19 declaration under Section 564(b)(1) of the Act, 21 U.S.C. section 360bbb-3(b)(1), unless the authorization is terminated or revoked.  Performed at [facility], [address] [place], [place] [address]   MRSA Next Gen by PCR, Nasal     Status: Abnormal   Collection Time: 10/24/23 12:40 PM   Specimen: Anterior Nasal Swab  Result Value Ref Range Status   MRSA by PCR Next Gen DETECTED (A) NOT DETECTED Final    Comment: RESULT CALLED TO, READ BACK BY AND VERIFIED WITH: R TEJEDA AT 1613 ON [id number] BY S DALTON (NOTE) The GeneXpert MRSA Assay (FDA approved for NASAL specimens only), is one component of a comprehensive MRSA colonization surveillance program. It is not intended to diagnose MRSA infection nor to guide or monitor treatment for MRSA infections. Test performance is not FDA approved in patients less than 60 years old. Performed at [facility], [address], [place], [place] [address]          Radiology Studies: ECHOCARDIOGRAM COMPLETE Result Date: 10/24/2023    ECHOCARDIOGRAM REPORT   Patient Name:   Shane Chambers Date of Exam: 10/24/2023 Medical Rec #:  [id number]       Height:       70.0 in Accession #:    [id number]      Weight:       293.0 lb Date of Birth:  Jun 21, 1961       BSA:          2.455 m Patient Age:    62 years        BP:           137/70 mmHg Patient Gender: M               HR:           62 bpm. [facility] Procedure: 2D Echo, Cardiac Doppler and Color Doppler Indications:    CHF  History:        Patient has  prior history of Echocardiogram examinations, most                 recent 02/16/2023.  Sonographer:    Harriette Bouillon RDCS Referring Phys: [id number] [facility] IMPRESSIONS  1. Left ventricular ejection fraction, by estimation, is 55 to 60%. The left ventricle has normal function. The left ventricle has no regional wall motion abnormalities. The left ventricular internal cavity size was mildly to moderately dilated. There is mild left ventricular hypertrophy. Left ventricular diastolic parameters are consistent with Grade III diastolic dysfunction (restrictive). Elevated left atrial pressure. The E/e' is 26.  2. Right ventricular systolic function is mildly reduced. The right ventricular size is  normal.  3. Left atrial size was severely dilated.  4. The mitral valve is normal in structure. Mild mitral valve regurgitation. No evidence of mitral stenosis.  5. The aortic valve has an indeterminant number of cusps. Aortic valve regurgitation is not visualized. Mild aortic valve stenosis.  6. The inferior vena cava is dilated in size with >50% respiratory variability, suggesting right atrial pressure of 8 mmHg. Comparison(s): A prior study was performed on 02/16/2023. Grade 3 diastolic dysfunction and elevated filling pressures are new findings. Prior study noted moderate AS which is at best mild on current study. Otherwise see prior study for details. FINDINGS  Left Ventricle: Left ventricular ejection fraction, by estimation, is 55 to 60%. The left ventricle has normal function. The left ventricle has no regional wall motion abnormalities. The left ventricular internal cavity size was mildly to moderately dilated. There is mild left ventricular hypertrophy. Left ventricular diastolic parameters are consistent with Grade III diastolic dysfunction (restrictive). Elevated left atrial pressure. The E/e' is 85. Right Ventricle: The right ventricular size is normal. Right vetricular wall thickness was not well visualized. Right  ventricular systolic function is mildly reduced. Left Atrium: Left atrial size was severely dilated. Right Atrium: Right atrial size was normal in size. Pericardium: There is no evidence of pericardial effusion. Mitral Valve: The mitral valve is normal in structure. Mild mitral valve regurgitation. No evidence of mitral valve stenosis. Tricuspid Valve: The tricuspid valve is normal in structure. Tricuspid valve regurgitation is trivial. No evidence of tricuspid stenosis. Aortic Valve: The aortic valve has an indeterminant number of cusps. Aortic valve regurgitation is not visualized. Mild aortic stenosis is present. Aortic valve mean gradient measures 11.0 mmHg. Aortic valve peak gradient measures 18.8 mmHg. Aortic valve  area, by VTI measures 1.82 cm. Pulmonic Valve: The pulmonic valve was normal in structure. Pulmonic valve regurgitation is trivial. No evidence of pulmonic stenosis. Aorta: The aortic root and ascending aorta are structurally normal, with no evidence of dilitation. Venous: The inferior vena cava is dilated in size with greater than 50% respiratory variability, suggesting right atrial pressure of 8 mmHg. IAS/Shunts: The interatrial septum was not well visualized.  LEFT VENTRICLE PLAX 2D LVIDd:         6.30 cm   Diastology LVIDs:         3.90 cm   LV e' medial:    4.90 cm/s LV PW:         1.20 cm   LV E/e' medial:  29.8 LV IVS:        1.20 cm   LV e' lateral:   6.74 cm/s LVOT diam:     2.20 cm   LV E/e' lateral: 21.7 LV SV:         105 LV SV Index:   43 LVOT Area:     3.80 cm  RIGHT VENTRICLE            IVC RV S prime:     7.07 cm/s  IVC diam: 2.40 cm TAPSE (M-mode): 1.2 cm LEFT ATRIUM              Index LA diam:        4.50 cm  1.83 cm/m LA Vol (A2C):   116.0 ml 47.25 ml/m LA Vol (A4C):   124.0 ml 50.51 ml/m LA Biplane Vol: 125.0 ml 50.91 ml/m  AORTIC VALVE AV Area (Vmax):    1.77 cm AV Area (Vmean):   1.75 cm AV Area (VTI):  1.82 cm AV Vmax:           217.00 cm/s AV Vmean:           156.000 cm/s AV VTI:            0.574 m AV Peak Grad:      18.8 mmHg AV Mean Grad:      11.0 mmHg LVOT Vmax:         101.00 cm/s LVOT Vmean:        72.000 cm/s LVOT VTI:          0.275 m LVOT/AV VTI ratio: 0.48  AORTA Ao Root diam: 3.30 cm Ao Asc diam:  3.80 cm MITRAL VALVE MV Area (PHT): 3.72 cm     SHUNTS MV Decel Time: 204 msec     Systemic VTI:  0.28 m MV E velocity: 146.00 cm/s  Systemic Diam: 2.20 cm MV A velocity: 52.60 cm/s MV E/A ratio:  2.78 Sunit Tolia Electronically signed by Tessa Lerner Signature Date/Time: 10/24/2023/4:18:54 PM    Final    DG Chest Port 1 View Result Date: 10/24/2023 CLINICAL DATA:  sob EXAM: PORTABLE CHEST - 1 VIEW COMPARISON:  03/29/2023. FINDINGS: Cardiac silhouette is prominent. There is pulmonary interstitial prominence with vascular congestion. No focal consolidation. No pneumothorax. Small bilateral pleural effusions. There are median sternotomy wires. IMPRESSION: Findings suggest CHF. Electronically Signed   By: Layla Maw M.D.   On: 10/24/2023 10:44        Scheduled Meds:  aspirin EC  81 mg Oral Q breakfast   atorvastatin  40 mg Oral QHS   Dextromethorphan-buPROPion ER  1 tablet Oral BID   ferrous gluconate  324 mg Oral Q breakfast   furosemide  80 mg Intravenous Q12H   gabapentin  400 mg Oral QHS   heparin  5,000 Units Subcutaneous Q8H   levothyroxine  75 mcg Oral QAC breakfast   loratadine  10 mg Oral Daily   lubiprostone  8 mcg Oral BID WC   pantoprazole  40 mg Oral Daily   polyethylene glycol  17 g Oral Daily   sodium chloride flush  3 mL Intravenous Q12H   tamsulosin  0.8 mg Oral QPM   Continuous Infusions:  sodium chloride       LOS: 1 day    Time spent: 55 minutes    Severus Brodzinski Hoover Brunette, DO [facility]  If 7PM-7AM, please contact night-coverage [email or web address] 10/25/2023, 10:54 AM

## 2023-10-25 NOTE — TOC Initial Note (Signed)
Transition of Care [facility]) - Initial/Assessment Note    Patient Details  Name: Shane Chambers MRN: [id number] Date of Birth: 08/09/1961  Transition of [facility]) CM/SW Contact:    Villa Herb, LCSWA Phone Number: 10/25/2023, 10:47 AM  Clinical Narrative:                 CSW noted per chart review that pt arrived from [facility]. CSW spoke to [place] with [facility] who states pt is a LTC resident and can return at D/C. TOC to follow.   Expected Discharge Plan: Long Term Nursing Home Barriers to Discharge: Continued Medical Work up   Patient Goals and CMS Choice Patient states their goals for this hospitalization and ongoing recovery are:: return to LTC CMS Medicare.gov Compare Post Acute Care list provided to:: Patient Choice offered to / list presented to : Patient      Expected Discharge Plan and Services In-house Referral: Clinical Social Work Discharge Planning Services: CM Consult Post Acute Care Choice: Skilled Nursing Facility Living arrangements for the past 2 months: Skilled Nursing Facility                                      Prior Living Arrangements/Services Living arrangements for the past 2 months: Skilled Nursing Facility Lives with:: Facility Resident Patient language and need for interpreter reviewed:: Yes Do you feel safe going back to the place where you live?: Yes      Need for Family Participation in Patient Care: Yes (Comment) Care giver support system in place?: Yes (comment)   Criminal Activity/Legal Involvement Pertinent to Current Situation/Hospitalization: No - Comment as needed  Activities of Daily Living   ADL Screening (condition at time of admission) Independently performs ADLs?: No Does the patient have a NEW difficulty with bathing/dressing/toileting/self-feeding that is expected to last >3 days?: No Does the patient have a NEW difficulty with getting in/out of bed, walking, or climbing stairs that is expected to last  >3 days?: Yes (Initiates electronic notice to provider for possible PT consult) Does the patient have a NEW difficulty with communication that is expected to last >3 days?: No Is the patient deaf or have difficulty hearing?: No Does the patient have difficulty seeing, even when wearing glasses/contacts?: No Does the patient have difficulty concentrating, remembering, or making decisions?: No  Permission Sought/Granted                  Emotional Assessment Appearance:: Appears stated age Attitude/Demeanor/Rapport: Engaged Affect (typically observed): Accepting   Alcohol / Substance Use: Not Applicable Psych Involvement: No (comment)  Admission diagnosis:  Acute on chronic diastolic congestive heart failure (HCC) [I50.33] Acute on chronic respiratory failure with hypoxia (HCC) [J96.21] Acute on chronic hypoxic respiratory failure (HCC) [J96.21] Patient Active Problem List   Diagnosis Date Noted   Acute on chronic hypoxic respiratory failure (HCC) 10/24/2023   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   HLD (hyperlipidemia) 07/30/2023   Type II diabetes mellitus with renal manifestations (HCC) 07/30/2023   Chronic diastolic CHF (congestive heart failure) (HCC) 07/30/2023   Acute on chronic heart failure with preserved ejection fraction (HFpEF) (HCC) 03/27/2023   COPD with acute exacerbation (HCC) 03/27/2023   Dyslipidemia 03/27/2023   Type 2 diabetes mellitus without complications (HCC) 03/27/2023   Hypothyroidism 03/27/2023   CKD stage 3a, GFR 45-59 ml/min (HCC) 03/27/2023   Moderate aortic stenosis 03/27/2023   Acute urinary retention 03/27/2023  Hyperkalemia 02/15/2023   Pulmonary edema 02/15/2023   Bilateral pleural effusion 02/15/2023   Nausea & vomiting 02/15/2023   Chronic diarrhea 02/15/2023   Obesity (BMI 30-39.9) 02/15/2023   High anion gap metabolic acidosis 02/14/2023   S/P BKA (below knee amputation) unilateral, right (HCC) 12/17/2022   MRSA infection 11/19/2022    Amputation stump infection (HCC) 11/19/2022   QT prolongation 11/10/2022   Severe recurrent major depression without psychotic features (HCC) 11/05/2022   Major depressive disorder with psychotic features (HCC) 11/02/2022   Acute hypoactive delirium due to another medical condition 10/13/2022   Septic arthritis of right ankle (HCC) 10/04/2022   Acute osteomyelitis (HCC) 10/02/2022   Subacute osteomyelitis, right ankle and foot (HCC) 10/02/2022   Acquired hypothyroidism 10/02/2022   Hypoalbuminemia due to protein-calorie malnutrition (HCC) 10/02/2022   Cellulitis of right lower extremity 09/25/2022   Lactic acidosis 04/23/2022   Anemia of chronic disease 04/23/2022   Morbid obesity (HCC) 04/23/2022   AKI (acute kidney injury) (HCC) 04/22/2022   Elevated troponin 04/22/2022   Cancer of sigmoid colon (HCC) 12/16/2021   Constipation 12/16/2021   GERD without esophagitis 12/16/2021   Iron deficiency anemia 09/17/2021   Rectal cancer (HCC) 09/03/2021   Bilateral lower leg cellulitis 11/22/2020   Systolic and diastolic CHF, chronic (HCC) 11/22/2020   CHF exacerbation (HCC) 08/01/2020   Visit for wound check 05/28/2020   OSA (obstructive sleep apnea) 05/24/2020   COPD (chronic obstructive pulmonary disease) (HCC) 04/29/2020   Leukocytosis 04/29/2020   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   Ischemic cardiomyopathy    Coronary artery disease    Acute on chronic combined systolic and diastolic CHF (congestive heart failure) (HCC) 03/05/2020   Acute hyponatremia 03/05/2020   Acute respiratory failure with hypoxia (HCC) 12/21/2019   Severe Vitamin D deficiency 12/02/2019   Hypokalemia 12/01/2019   Mixed hyperlipidemia 12/01/2019   BPH (benign prostatic hyperplasia) 12/01/2019   Normocytic anemia 12/01/2019   Pneumonia due to COVID-19 virus 12/01/2019   Hypoxia    SOB (shortness of breath) 11/16/2019   Severe sepsis (HCC) 11/16/2019   Essential hypertension    Uncontrolled type 2 diabetes mellitus  with hypoglycemia, with long-term current use of insulin (HCC)    Depression    Acute bronchitis with bronchospasm 11/15/2019   PCP:  [name], MD Pharmacy:   [facility] [address] [place], [place] - [address] [place] [address] Phone: [phone] Fax: [phone]  [facility] [place], [place] - [address] [place] [address] Phone: [phone] Fax: [phone]     Social Drivers of Health (SDOH) Social History: SDOH Screenings   Food Insecurity: No Food Insecurity (10/24/2023)  Housing: Low Risk  (10/24/2023)  Transportation Needs: No Transportation Needs (10/24/2023)  Utilities: Not At Risk (10/24/2023)  Alcohol Screen: Low Risk  (11/05/2022)  Tobacco Use: Medium Risk (10/24/2023)   SDOH Interventions:     Readmission Risk Interventions    10/25/2023   10:46 AM 02/19/2023    1:52 PM 02/15/2023   11:15 AM  Readmission Risk Prevention Plan  Transportation Screening Complete Complete Complete  Medication Review Oceanographer) Complete Complete Complete  PCP or Specialist appointment within 3-5 days of discharge  Complete   HRI or Home Care Consult Complete Complete Complete  SW Recovery Care/Counseling Consult Complete Complete Complete  Palliative Care Screening Not Applicable Not Applicable Not Applicable  Skilled Nursing Facility Complete Complete Complete

## 2023-10-26 DIAGNOSIS — E119 Type 2 diabetes mellitus without complications: Secondary | ICD-10-CM

## 2023-10-26 DIAGNOSIS — E039 Hypothyroidism, unspecified: Secondary | ICD-10-CM

## 2023-10-26 DIAGNOSIS — J449 Chronic obstructive pulmonary disease, unspecified: Secondary | ICD-10-CM

## 2023-10-26 DIAGNOSIS — I5033 Acute on chronic diastolic (congestive) heart failure: Secondary | ICD-10-CM | POA: Diagnosis not present

## 2023-10-26 DIAGNOSIS — N179 Acute kidney failure, unspecified: Secondary | ICD-10-CM | POA: Diagnosis not present

## 2023-10-26 DIAGNOSIS — N400 Enlarged prostate without lower urinary tract symptoms: Secondary | ICD-10-CM

## 2023-10-26 DIAGNOSIS — I10 Essential (primary) hypertension: Secondary | ICD-10-CM

## 2023-10-26 DIAGNOSIS — Z794 Long term (current) use of insulin: Secondary | ICD-10-CM

## 2023-10-26 DIAGNOSIS — J9621 Acute and chronic respiratory failure with hypoxia: Secondary | ICD-10-CM | POA: Diagnosis not present

## 2023-10-26 DIAGNOSIS — N1831 Chronic kidney disease, stage 3a: Secondary | ICD-10-CM

## 2023-10-26 LAB — CBC
HCT: 25.7 % — ABNORMAL LOW (ref 39.0–52.0)
Hemoglobin: 8 g/dL — ABNORMAL LOW (ref 13.0–17.0)
MCH: 31.1 pg (ref 26.0–34.0)
MCHC: 31.1 g/dL (ref 30.0–36.0)
MCV: 100 fL (ref 80.0–100.0)
Platelets: 231 10*3/uL (ref 150–400)
RBC: 2.57 MIL/uL — ABNORMAL LOW (ref 4.22–5.81)
RDW: 14 % (ref 11.5–15.5)
WBC: 10.7 10*3/uL — ABNORMAL HIGH (ref 4.0–10.5)
nRBC: 0 % (ref 0.0–0.2)

## 2023-10-26 LAB — BASIC METABOLIC PANEL
Anion gap: 13 (ref 5–15)
BUN: 69 mg/dL — ABNORMAL HIGH (ref 6–20)
CO2: 25 mmol/L (ref 22–32)
Calcium: 8.6 mg/dL — ABNORMAL LOW (ref 8.9–10.3)
Chloride: 100 mmol/L (ref 98–111)
Creatinine, Ser: 3.19 mg/dL — ABNORMAL HIGH (ref 0.61–1.24)
GFR, Estimated: 21 mL/min — ABNORMAL LOW (ref >60)
Glucose, Bld: 104 mg/dL — ABNORMAL HIGH (ref 70–99)
Potassium: 4.6 mmol/L (ref 3.5–5.1)
Sodium: 138 mmol/L (ref 135–145)

## 2023-10-26 LAB — GLUCOSE, CAPILLARY: Glucose-Capillary: 116 mg/dL — ABNORMAL HIGH (ref 70–99)

## 2023-10-26 LAB — MAGNESIUM: Magnesium: 1.5 mg/dL — ABNORMAL LOW (ref 1.7–2.4)

## 2023-10-26 MED ORDER — PROCHLORPERAZINE EDISYLATE 10 MG/2ML IJ SOLN
10.0000 mg | Freq: Four times a day (QID) | INTRAMUSCULAR | Status: DC | PRN
  Administered 2023-10-26: 10 mg via INTRAVENOUS
  Filled 2023-10-26: qty 2

## 2023-10-26 NOTE — Significant Event (Signed)
Notified by RN of patient expressing suicidal ideation. Placed on suicide precautions and psychiatry consulted to see tomorrow.   Dolly Rias, MD  [facility]

## 2023-10-26 NOTE — Plan of Care (Signed)
  Problem: Education: Goal: Knowledge of General Education information will improve Description: Including pain rating scale, medication(s)/side effects and non-pharmacologic comfort measures Outcome: Progressing   Problem: Health Behavior/Discharge Planning: Goal: Ability to manage health-related needs will improve Outcome: Progressing   Problem: Clinical Measurements: Goal: Ability to maintain clinical measurements within normal limits will improve Outcome: Progressing   Problem: Elimination: Goal: Will not experience complications related to urinary retention Outcome: Progressing   Problem: Pain Management: Goal: General experience of comfort will improve Outcome: Progressing

## 2023-10-26 NOTE — Plan of Care (Signed)

## 2023-10-26 NOTE — Progress Notes (Signed)
   10/26/23 1809  ReDS Vest / Clip  Station Marker D  Ruler Value 43  ReDS Value Range (!) > 40  ReDS Actual Value 56  Anatomical Comments  (pt sitting upright, low quality x2 then this reading.)

## 2023-10-26 NOTE — Progress Notes (Signed)
PROGRESS NOTE    Shane Chambers  MWN:[id number] DOB: June 13, 1961 DOA: 10/24/2023 PCP: [name], MD   Brief Narrative:    Shane Chambers is a 62 y.o. male with medical history significant for hypertension, type 2 diabetes, COPD on baseline 2 L nasal cannula, dyslipidemia, CAD, diastolic CHF, hypothyroidism, depression, BPH, CKD 3 AAA, right BKA, and rectal cancer in 2022 who presented to the ED with worsening shortness of breath from SN[facility].   Assessment & Plan:   Principal Problem:   Acute on chronic hypoxic respiratory failure (HCC) Active Problems:   Essential hypertension   COPD (chronic obstructive pulmonary disease) (HCC)   Type 2 diabetes mellitus without complications (HCC)   CKD stage 3a, GFR 45-59 ml/min (HCC)   BPH (benign prostatic hyperplasia)   Normocytic anemia   AKI (acute kidney injury) (HCC)   Morbid obesity (HCC)   Acquired hypothyroidism   Dyslipidemia  Assessment and Plan:  Acute on chronic hypoxemic respiratory failure secondary to acute diastolic CHF exacerbation -2D echocardiogram 06/2023 showed EF 60-65% -Repeat limited 2D echocardiogram 12/14 showing LVEF 55-60% with grade 3 diastolic dysfunction -I continue IV diuresis. -Continue to wean oxygen to baseline 2 L nasal cannula as tolerated. -Continue to follow low-sodium diet, daily weights and measure REDs.   AKI on CKD stage IIIa -Currently improving from baseline with diuresis. -Likely cardiorenal secondary to volume overload from above -Continue to monitor strict I's and O's -Continue to minimize nephrotoxic agents avoid the use of contrast and hypotension.   Hypertension -Continue to hold amlodipine -Continue IV diuresis -Vital signs stable for the most part -Continue to follow blood pressure trend.   COPD -Currently without signs of acute exacerbation -Continue as needed bronchodilator management.   Dyslipidemia -Continue statin -Heart healthy diet has been ordered.    Hypothyroidism -Continue levothyroxine   Type 2 diabetes -Continue SSI and Carb modified diet -Follow CBG fluctuation and adjust hypoglycemic regimen as required.   Iron deficiency anemia -Continue iron supplementation -No signs of overt bleeding -Continue to follow hemoglobin trend.   BPH with chronic Foley catheter -Continue Flomax -No complaints of urinary retention currently.   Morbid obesity -BMI 42.04 -Low-calorie diet and portion control discussed with patient.   DVT prophylaxis:Heparin Code Status: Full Family Communication: None at bedside Disposition Plan: home  Status is: Inpatient Remains inpatient appropriate because: Need for IV diuresis.  Consultants:  None  Procedures:  None  Antimicrobials:  None   Subjective: Very rough night as per nursing staff report; was somnolent at time of examination.  No chest pain, no nausea, no vomiting.  Good urine output appreciated.  Still requiring 4 L nasal cannula supplementation and demonstrating signs of fluid overload on exam (increased abdominal girth, lower extremity edema and fine crackles).  Objective: Vitals:   10/25/23 2015 10/25/23 2117 10/26/23 0045 10/26/23 1434  BP: (!) 149/68 (!) 157/70 (!) 161/68 (!) 122/49  Pulse: 69 69  71  Resp: 20 (!) 21 (!) 21   Temp: 98.4 F (36.9 C) (!) 97.3 F (36.3 C) 98.6 F (37 C) 98.8 F (37.1 C)  TempSrc: Oral Oral Oral Oral  SpO2: 97% 96% 96% 95%  Weight:      Height:        Intake/Output Summary (Last 24 hours) at 10/26/2023 1652 Last data filed at 10/26/2023 1300 Gross per 24 hour  Intake 723 ml  Output 3350 ml  Net -2627 ml   Filed Weights   10/24/23 1009 10/25/23 0646  Weight: 132.9  kg 126.2 kg    Examination: General exam: Alert, able to follow commands and answer simple questions; slightly somnolent after experiencing a rough night per nursing report. Respiratory system: Positive scattered rhonchi; further movement bilaterally.  Decreased  breath sounds at the bases and fine crackles appreciated on exam. Cardiovascular system:RRR. No rubs or gallops; positive systolic murmur. Gastrointestinal system: Abdomen is nondistended, soft and nontender. No organomegaly or masses felt. Normal bowel sounds heard. Central nervous system: No focal neurological deficits. Extremities: No cyanosis or clubbing; right BKA appreciated on exam.  1-2+ edema on his left lower extremity seen. Skin: No petechiae. Psychiatry: Judgement and insight appear normal.  Flat affect.   Data Reviewed: I have personally reviewed following labs and imaging studies  CBC: Recent Labs  Lab 10/24/23 1007 10/26/23 0448  WBC 11.6* 10.7*  NEUTROABS 9.5*  --   HGB 8.4* 8.0*  HCT 28.3* 25.7*  MCV 102.9* 100.0  PLT 246 231   Basic Metabolic Panel: Recent Labs  Lab 10/24/23 1007 10/25/23 0446 10/26/23 0448  NA 138 135 138  K 5.4* 5.0 4.6  CL 102 101 100  CO2 26 22 25   GLUCOSE 141* 130* 104*  BUN 63* 68* 69*  CREATININE 3.66* 3.63* 3.19*  CALCIUM 8.5* 8.2* 8.6*  MG  --   --  1.5*   GFR: Estimated Creatinine Clearance: 32.8 mL/min (A) (by C-G formula based on SCr of 3.19 mg/dL (H)).  CBG: Recent Labs  Lab 10/26/23 0610  GLUCAP 116*   Recent Results (from the past 240 hours)  Resp panel by RT-PCR (RSV, Flu A&B, Covid) Anterior Nasal Swab     Status: None   Collection Time: 10/24/23 10:11 AM   Specimen: Anterior Nasal Swab  Result Value Ref Range Status   SARS Coronavirus 2 by RT PCR NEGATIVE NEGATIVE Final    Comment: (NOTE) SARS-CoV-2 target nucleic acids are NOT DETECTED.  The SARS-CoV-2 RNA is generally detectable in upper respiratory specimens during the acute phase of infection. The lowest concentration of SARS-CoV-2 viral copies this assay can detect is 138 copies/mL. A negative result does not preclude SARS-Cov-2 infection and should not be used as the sole basis for treatment or other patient management decisions. A negative result  may occur with  improper specimen collection/handling, submission of specimen other than nasopharyngeal swab, presence of viral mutation(s) within the areas targeted by this assay, and inadequate number of viral copies(<138 copies/mL). A negative result must be combined with clinical observations, patient history, and epidemiological information. The expected result is Negative.  Fact Sheet for Patients:  BloggerCourse.com  Fact Sheet for Healthcare Providers:  SeriousBroker.it  This test is no t yet approved or cleared by the [place] FDA and  has been authorized for detection and/or diagnosis of SARS-CoV-2 by FDA under an Emergency Use Authorization (EUA). This EUA will remain  in effect (meaning this test can be used) for the duration of the COVID-19 declaration under Section 564(b)(1) of the Act, 21 U.S.C.section 360bbb-3(b)(1), unless the authorization is terminated  or revoked sooner.       Influenza A by PCR NEGATIVE NEGATIVE Final   Influenza B by PCR NEGATIVE NEGATIVE Final    Comment: (NOTE) The Xpert Xpress SARS-CoV-2/FLU/RSV plus assay is intended as an aid in the diagnosis of influenza from Nasopharyngeal swab specimens and should not be used as a sole basis for treatment. Nasal washings and aspirates are unacceptable for Xpert Xpress SARS-CoV-2/FLU/RSV testing.  Fact Sheet for Patients: BloggerCourse.com  Fact  Sheet for Healthcare Providers: SeriousBroker.it  This test is not yet approved or cleared by the [place] and has been authorized for detection and/or diagnosis of SARS-CoV-2 by FDA under an Emergency Use Authorization (EUA). This EUA will remain in effect (meaning this test can be used) for the duration of the COVID-19 declaration under Section 564(b)(1) of the Act, 21 U.S.C. section 360bbb-3(b)(1), unless the authorization is terminated  or revoked.     Resp Syncytial Virus by PCR NEGATIVE NEGATIVE Final    Comment: (NOTE) Fact Sheet for Patients: BloggerCourse.com  Fact Sheet for Healthcare Providers: SeriousBroker.it  This test is not yet approved or cleared by the [place] FDA and has been authorized for detection and/or diagnosis of SARS-CoV-2 by FDA under an Emergency Use Authorization (EUA). This EUA will remain in effect (meaning this test can be used) for the duration of the COVID-19 declaration under Section 564(b)(1) of the Act, 21 U.S.C. section 360bbb-3(b)(1), unless the authorization is terminated or revoked.  Performed at [facility], [address], [place], [place] [address]   MRSA Next Gen by PCR, Nasal     Status: Abnormal   Collection Time: 10/24/23 12:40 PM   Specimen: Anterior Nasal Swab  Result Value Ref Range Status   MRSA by PCR Next Gen DETECTED (A) NOT DETECTED Final    Comment: RESULT CALLED TO, READ BACK BY AND VERIFIED WITH: R TEJEDA AT 1613 ON [id number] BY S DALTON (NOTE) The GeneXpert MRSA Assay (FDA approved for NASAL specimens only), is one component of a comprehensive MRSA colonization surveillance program. It is not intended to diagnose MRSA infection nor to guide or monitor treatment for MRSA infections. Test performance is not FDA approved in patients less than 42 years old. Performed at [facility], [address] [place], [place] [address]      Radiology Studies: No results found.   Scheduled Meds:  aspirin EC  81 mg Oral Q breakfast   atorvastatin  40 mg Oral QHS   Dextromethorphan-buPROPion ER  1 tablet Oral BID   ferrous gluconate  324 mg Oral Q breakfast   furosemide  80 mg Intravenous Q12H   gabapentin  400 mg Oral QHS   heparin  5,000 Units Subcutaneous Q8H   levothyroxine  75 mcg Oral QAC breakfast   loratadine  10 mg Oral Daily   lubiprostone  8 mcg Oral BID WC   pantoprazole  40 mg Oral Daily    polyethylene glycol  17 g Oral Daily   sodium chloride flush  3 mL Intravenous Q12H   tamsulosin  0.8 mg Oral QPM   Continuous Infusions:     LOS: 2 days    Time spent: 50 minutes    Vassie Loll, MD [facility]  If 7PM-7AM, please contact night-coverage [email or web address] 10/26/2023, 4:52 PM

## 2023-10-27 DIAGNOSIS — N179 Acute kidney failure, unspecified: Secondary | ICD-10-CM | POA: Diagnosis not present

## 2023-10-27 DIAGNOSIS — J9621 Acute and chronic respiratory failure with hypoxia: Secondary | ICD-10-CM | POA: Diagnosis not present

## 2023-10-27 DIAGNOSIS — E039 Hypothyroidism, unspecified: Secondary | ICD-10-CM | POA: Diagnosis not present

## 2023-10-27 DIAGNOSIS — I5033 Acute on chronic diastolic (congestive) heart failure: Secondary | ICD-10-CM | POA: Diagnosis not present

## 2023-10-27 LAB — BASIC METABOLIC PANEL
Anion gap: 9 (ref 5–15)
BUN: 64 mg/dL — ABNORMAL HIGH (ref 6–20)
CO2: 33 mmol/L — ABNORMAL HIGH (ref 22–32)
Calcium: 8.9 mg/dL (ref 8.9–10.3)
Chloride: 99 mmol/L (ref 98–111)
Creatinine, Ser: 2.44 mg/dL — ABNORMAL HIGH (ref 0.61–1.24)
GFR, Estimated: 30 mL/min — ABNORMAL LOW (ref >60)
Glucose, Bld: 138 mg/dL — ABNORMAL HIGH (ref 70–99)
Potassium: 4 mmol/L (ref 3.5–5.1)
Sodium: 141 mmol/L (ref 135–145)

## 2023-10-27 MED ORDER — MUPIROCIN 2 % EX OINT
1.0000 | TOPICAL_OINTMENT | Freq: Two times a day (BID) | CUTANEOUS | Status: DC
  Administered 2023-10-27 – 2023-10-31 (×8): 1 via NASAL
  Filled 2023-10-27 (×3): qty 22

## 2023-10-27 MED ORDER — HYDROXYZINE HCL 25 MG PO TABS
25.0000 mg | ORAL_TABLET | Freq: Three times a day (TID) | ORAL | Status: DC | PRN
  Administered 2023-10-27 – 2023-10-31 (×8): 25 mg via ORAL
  Filled 2023-10-27 (×8): qty 1

## 2023-10-27 NOTE — Progress Notes (Signed)
Entered patient room at 2300 due to secretary stated that patient called down to the E.D. department stating he wanted to harm himself. Talking with patient, patient stated, "I want to close my eyes and not wake up." Patient does not have a plan. Patient is not homicidal. Patient states he does have a history of suicidal ideation and has attempted in the past. Provider notified. Patient placed on 1:1 observation. Necessary items removed from room. Patient searched. $141.00 found in patient bag and locked up with security. Patient phone placed in his personal bag and bag placed in medication room.

## 2023-10-27 NOTE — Plan of Care (Signed)
Patient placed on 1:1 observation due to suicidal ideations. Patient has been cooperative during the shift. No behavioral problems. Sitter at bedside.  Problem: Education: Goal: Knowledge of General Education information will improve Description: Including pain rating scale, medication(s)/side effects and non-pharmacologic comfort measures 10/27/2023 0507 by Ena Dawley, RN Outcome: Progressing 10/27/2023 0506 by Ena Dawley, RN Outcome: Progressing   Problem: Health Behavior/Discharge Planning: Goal: Ability to manage health-related needs will improve 10/27/2023 0507 by Ena Dawley, RN Outcome: Progressing 10/27/2023 0506 by Ena Dawley, RN Outcome: Progressing   Problem: Clinical Measurements: Goal: Ability to maintain clinical measurements within normal limits will improve 10/27/2023 0507 by Ena Dawley, RN Outcome: Progressing 10/27/2023 0506 by Ena Dawley, RN Outcome: Progressing Goal: Will remain free from infection 10/27/2023 0507 by Ena Dawley, RN Outcome: Progressing 10/27/2023 0506 by Ena Dawley, RN Outcome: Progressing Goal: Diagnostic test results will improve 10/27/2023 0507 by Ena Dawley, RN Outcome: Progressing 10/27/2023 0506 by Ena Dawley, RN Outcome: Progressing Goal: Respiratory complications will improve 10/27/2023 0507 by Ena Dawley, RN Outcome: Progressing 10/27/2023 0506 by Ena Dawley, RN Outcome: Progressing Goal: Cardiovascular complication will be avoided 10/27/2023 0507 by Ena Dawley, RN Outcome: Progressing 10/27/2023 0506 by Ena Dawley, RN Outcome: Progressing   Problem: Activity: Goal: Risk for activity intolerance will decrease 10/27/2023 0507 by Ena Dawley, RN Outcome: Progressing 10/27/2023 0506 by Ena Dawley, RN Outcome: Progressing   Problem: Nutrition: Goal: Adequate nutrition will be maintained 10/27/2023 0507 by Ena Dawley,  RN Outcome: Progressing 10/27/2023 0506 by Ena Dawley, RN Outcome: Progressing   Problem: Coping: Goal: Level of anxiety will decrease 10/27/2023 0507 by Ena Dawley, RN Outcome: Progressing 10/27/2023 0506 by Ena Dawley, RN Outcome: Progressing   Problem: Elimination: Goal: Will not experience complications related to bowel motility 10/27/2023 0507 by Ena Dawley, RN Outcome: Progressing 10/27/2023 0506 by Ena Dawley, RN Outcome: Progressing Goal: Will not experience complications related to urinary retention 10/27/2023 0507 by Ena Dawley, RN Outcome: Progressing 10/27/2023 0506 by Ena Dawley, RN Outcome: Progressing   Problem: Pain Management: Goal: General experience of comfort will improve 10/27/2023 0507 by Ena Dawley, RN Outcome: Progressing 10/27/2023 0506 by Ena Dawley, RN Outcome: Progressing   Problem: Safety: Goal: Ability to remain free from injury will improve 10/27/2023 0507 by Ena Dawley, RN Outcome: Progressing 10/27/2023 0506 by Ena Dawley, RN Outcome: Progressing   Problem: Skin Integrity: Goal: Risk for impaired skin integrity will decrease 10/27/2023 0507 by Ena Dawley, RN Outcome: Progressing 10/27/2023 0506 by Ena Dawley, RN Outcome: Progressing   Problem: Education: Goal: Knowledge of warning signs, risks, and behaviors that relate to suicide ideation and self-harm behaviors will improve Outcome: Progressing   Problem: Health Behavior/Discharge (Transition) Planning: Goal: Ability to manage health-related needs will improve Outcome: Progressing   Problem: Clinical Measurements: Goal: Remain free from any harm during hospitalization Outcome: Progressing   Problem: Nutrition: Goal: Adequate fluids and nutrition will be maintained Outcome: Progressing   Problem: Coping: Goal: Ability to disclose and discuss thoughts of suicide and self-harm will  improve Outcome: Progressing   Problem: Medication Management: Goal: Adhere to prescribed medication regimen Outcome: Progressing   Problem: Sleep Hygiene: Goal: Ability to obtain adequate restful sleep will improve Outcome: Progressing   Problem: Self Esteem: Goal: Ability to verbalize positive feeling about self will improve Outcome: Progressing

## 2023-10-27 NOTE — Plan of Care (Signed)
  Problem: Education: Goal: Knowledge of General Education information will improve Description: Including pain rating scale, medication(s)/side effects and non-pharmacologic comfort measures Outcome: Progressing   Problem: Health Behavior/Discharge Planning: Goal: Ability to manage health-related needs will improve Outcome: Progressing   Problem: Clinical Measurements: Goal: Ability to maintain clinical measurements within normal limits will improve Outcome: Progressing Goal: Will remain free from infection Outcome: Progressing Goal: Diagnostic test results will improve Outcome: Progressing Goal: Respiratory complications will improve Outcome: Progressing Goal: Cardiovascular complication will be avoided Outcome: Progressing   Problem: Activity: Goal: Risk for activity intolerance will decrease Outcome: Progressing   Problem: Nutrition: Goal: Adequate nutrition will be maintained Outcome: Progressing   Problem: Coping: Goal: Level of anxiety will decrease Outcome: Progressing   Problem: Elimination: Goal: Will not experience complications related to bowel motility Outcome: Progressing Goal: Will not experience complications related to urinary retention Outcome: Progressing   Problem: Pain Management: Goal: General experience of comfort will improve Outcome: Progressing   Problem: Safety: Goal: Ability to remain free from injury will improve Outcome: Progressing   Problem: Skin Integrity: Goal: Risk for impaired skin integrity will decrease Outcome: Progressing   Problem: Education: Goal: Knowledge of warning signs, risks, and behaviors that relate to suicide ideation and self-harm behaviors will improve Outcome: Progressing   Problem: Health Behavior/Discharge (Transition) Planning: Goal: Ability to manage health-related needs will improve Outcome: Progressing   Problem: Clinical Measurements: Goal: Remain free from any harm during  hospitalization Outcome: Progressing   Problem: Nutrition: Goal: Adequate fluids and nutrition will be maintained Outcome: Progressing   Problem: Coping: Goal: Ability to disclose and discuss thoughts of suicide and self-harm will improve Outcome: Progressing   Problem: Medication Management: Goal: Adhere to prescribed medication regimen Outcome: Progressing   Problem: Sleep Hygiene: Goal: Ability to obtain adequate restful sleep will improve Outcome: Progressing   Problem: Self Esteem: Goal: Ability to verbalize positive feeling about self will improve Outcome: Progressing

## 2023-10-27 NOTE — Progress Notes (Signed)
PROGRESS NOTE    CHIEF PERREAULT  ZOX:[id number] DOB: 16-Mar-1961 DOA: 10/24/2023 PCP: [name], MD   Brief Narrative:    Shane Chambers is a 62 y.o. male with medical history significant for hypertension, type 2 diabetes, COPD on baseline 2 L nasal cannula, dyslipidemia, CAD, diastolic CHF, hypothyroidism, depression, BPH, CKD 3 AAA, right BKA, and rectal cancer in 2022 who presented to the ED with worsening shortness of breath from SNF.   Assessment & Plan:   Principal Problem:   Acute on chronic hypoxic respiratory failure (HCC) Active Problems:   Essential hypertension   COPD (chronic obstructive pulmonary disease) (HCC)   Type 2 diabetes mellitus without complications (HCC)   CKD stage 3a, GFR 45-59 ml/min (HCC)   BPH (benign prostatic hyperplasia)   Normocytic anemia   AKI (acute kidney injury) (HCC)   Morbid obesity (HCC)   Acquired hypothyroidism   Dyslipidemia  Assessment and Plan:  Acute on chronic hypoxemic respiratory failure secondary to acute diastolic CHF exacerbation -2D echocardiogram 06/2023 showed EF 60-65% -Repeat limited 2D echocardiogram 12/14 showing LVEF 55-60% with grade 3 diastolic dysfunction -I continue IV diuresis. -Continue to wean oxygen to baseline 2 L nasal cannula as tolerated. -Continue to follow low-sodium diet, daily weights and measure REDs (most recent demonstrating this..   AKI on CKD stage IIIa -Continue currently improving from baseline with diuresis. -Likely cardiorenal secondary to volume overload from above -Continue to monitor strict I's and O's -Continue to minimize nephrotoxic agents avoid the use of contrast and hypotension.   Hypertension -Continue to hold amlodipine -Continue IV diuresis -Vital signs stable for the most part -Continue to follow blood pressure trend.   COPD -Currently without signs of acute exacerbation -Continue as needed bronchodilator management.   Dyslipidemia -Continue statin -Heart  healthy diet has been ordered.   Hypothyroidism -Continue levothyroxine   Type 2 diabetes -Continue SSI and Carb modified diet -Follow CBG fluctuation and adjust hypoglycemic regimen as required.   Iron deficiency anemia -Continue iron supplementation -No signs of overt bleeding -Continue to follow hemoglobin trend.   BPH with chronic Foley catheter -Continue Flomax -No complaints of urinary retention currently.   Morbid obesity -BMI 42.04 -Low-calorie diet and portion control discussed with patient.  Depression/suicidal ideation -On today's examination patient reports no suicidal ideation -Will follow recommendation by psychiatry service. -Continue sitter.   DVT prophylaxis:Heparin Code Status: Full Family Communication: None at bedside Disposition Plan: home  Status is: Inpatient Remains inpatient appropriate because: Need for IV diuresis.  Consultants:  None  Procedures:  None  Antimicrobials:  None   Subjective: Overnight expressing suicidal ideation and ended requiring sitter at bedside; no chest pain, no nausea, no vomiting.  Breathing is slightly better.  Improved urine output appreciated.  Still requiring 4 L nasal cannula supplementation.  Objective: Vitals:   10/26/23 2320 10/27/23 0451 10/27/23 0523 10/27/23 1239  BP: (!) 183/70  (!) 145/57 (!) 131/44  Pulse: 72  62 68  Resp:   17   Temp: 97.8 F (36.6 C)  98.1 F (36.7 C) 98.5 F (36.9 C)  TempSrc: Oral   Axillary  SpO2: 98%  96% 96%  Weight:  104.8 kg    Height:        Intake/Output Summary (Last 24 hours) at 10/27/2023 1829 Last data filed at 10/27/2023 1700 Gross per 24 hour  Intake 2600 ml  Output 6000 ml  Net -3400 ml   Filed Weights   10/24/23 1009 10/25/23 0646 10/27/23 0451  Weight: 132.9 kg 126.2 kg 104.8 kg    Examination: General exam: Alert, awake, demonstrating flat affect and is still requiring around 4 L nasal cannula supplementation.  Still with signs of fluid  overload.  Currently denying suicidal ideation. Respiratory system: Clear to auscultation. Respiratory effort normal. Cardiovascular system:RRR. No rubs or gallops; positive murmur appreciated on exam. Gastrointestinal system: Abdomen is obese, nondistended, soft and nontender.  Increased abdominal girth reported.  Positive bowel sounds. Central nervous system: No focal neurological deficits. Extremities: No cyanosis or clubbing; 2+ edema appreciated on his left lower extremity.  Right BKA. Skin: No petechiae. Psychiatry: No suicidal ideation at the moment; following commands appropriately.  Flat affect appreciated.   Data Reviewed: I have personally reviewed following labs and imaging studies  CBC: Recent Labs  Lab 10/24/23 1007 10/26/23 0448  WBC 11.6* 10.7*  NEUTROABS 9.5*  --   HGB 8.4* 8.0*  HCT 28.3* 25.7*  MCV 102.9* 100.0  PLT 246 231   Basic Metabolic Panel: Recent Labs  Lab 10/24/23 1007 10/25/23 0446 10/26/23 0448 10/27/23 0415  NA 138 135 138 141  K 5.4* 5.0 4.6 4.0  CL 102 101 100 99  CO2 26 22 25  33*  GLUCOSE 141* 130* 104* 138*  BUN 63* 68* 69* 64*  CREATININE 3.66* 3.63* 3.19* 2.44*  CALCIUM 8.5* 8.2* 8.6* 8.9  MG  --   --  1.5*  --    GFR: Estimated Creatinine Clearance: 39 mL/min (A) (by C-G formula based on SCr of 2.44 mg/dL (H)).  CBG: Recent Labs  Lab 10/26/23 0610  GLUCAP 116*   Recent Results (from the past 240 hours)  Resp panel by RT-PCR (RSV, Flu A&B, Covid) Anterior Nasal Swab     Status: None   Collection Time: 10/24/23 10:11 AM   Specimen: Anterior Nasal Swab  Result Value Ref Range Status   SARS Coronavirus 2 by RT PCR NEGATIVE NEGATIVE Final    Comment: (NOTE) SARS-CoV-2 target nucleic acids are NOT DETECTED.  The SARS-CoV-2 RNA is generally detectable in upper respiratory specimens during the acute phase of infection. The lowest concentration of SARS-CoV-2 viral copies this assay can detect is 138 copies/mL. A negative  result does not preclude SARS-Cov-2 infection and should not be used as the sole basis for treatment or other patient management decisions. A negative result may occur with  improper specimen collection/handling, submission of specimen other than nasopharyngeal swab, presence of viral mutation(s) within the areas targeted by this assay, and inadequate number of viral copies(<138 copies/mL). A negative result must be combined with clinical observations, patient history, and epidemiological information. The expected result is Negative.  Fact Sheet for Patients:  BloggerCourse.com  Fact Sheet for Healthcare Providers:  SeriousBroker.it  This test is no t yet approved or cleared by the [place] FDA and  has been authorized for detection and/or diagnosis of SARS-CoV-2 by FDA under an Emergency Use Authorization (EUA). This EUA will remain  in effect (meaning this test can be used) for the duration of the COVID-19 declaration under Section 564(b)(1) of the Act, 21 U.S.C.section 360bbb-3(b)(1), unless the authorization is terminated  or revoked sooner.       Influenza A by PCR NEGATIVE NEGATIVE Final   Influenza B by PCR NEGATIVE NEGATIVE Final    Comment: (NOTE) The Xpert Xpress SARS-CoV-2/FLU/RSV plus assay is intended as an aid in the diagnosis of influenza from Nasopharyngeal swab specimens and should not be used as a sole basis for treatment. Nasal washings  and aspirates are unacceptable for Xpert Xpress SARS-CoV-2/FLU/RSV testing.  Fact Sheet for Patients: BloggerCourse.com  Fact Sheet for Healthcare Providers: SeriousBroker.it  This test is not yet approved or cleared by the [place] FDA and has been authorized for detection and/or diagnosis of SARS-CoV-2 by FDA under an Emergency Use Authorization (EUA). This EUA will remain in effect (meaning this test can be used)  for the duration of the COVID-19 declaration under Section 564(b)(1) of the Act, 21 U.S.C. section 360bbb-3(b)(1), unless the authorization is terminated or revoked.     Resp Syncytial Virus by PCR NEGATIVE NEGATIVE Final    Comment: (NOTE) Fact Sheet for Patients: BloggerCourse.com  Fact Sheet for Healthcare Providers: SeriousBroker.it  This test is not yet approved or cleared by the [place] FDA and has been authorized for detection and/or diagnosis of SARS-CoV-2 by FDA under an Emergency Use Authorization (EUA). This EUA will remain in effect (meaning this test can be used) for the duration of the COVID-19 declaration under Section 564(b)(1) of the Act, 21 U.S.C. section 360bbb-3(b)(1), unless the authorization is terminated or revoked.  Performed at [facility], [address], [place], [place] [address]   MRSA Next Gen by PCR, Nasal     Status: Abnormal   Collection Time: 10/24/23 12:40 PM   Specimen: Anterior Nasal Swab  Result Value Ref Range Status   MRSA by PCR Next Gen DETECTED (A) NOT DETECTED Final    Comment: RESULT CALLED TO, READ BACK BY AND VERIFIED WITH: R TEJEDA AT 1613 ON [id number] BY S DALTON (NOTE) The GeneXpert MRSA Assay (FDA approved for NASAL specimens only), is one component of a comprehensive MRSA colonization surveillance program. It is not intended to diagnose MRSA infection nor to guide or monitor treatment for MRSA infections. Test performance is not FDA approved in patients less than 62 years old. Performed at [facility], [address], [place], [place] [address]      Radiology Studies: No results found.   Scheduled Meds:  aspirin EC  81 mg Oral Q breakfast   atorvastatin  40 mg Oral QHS   ferrous gluconate  324 mg Oral Q breakfast   furosemide  80 mg Intravenous Q12H   gabapentin  400 mg Oral QHS   heparin  5,000 Units Subcutaneous Q8H   levothyroxine  75 mcg Oral QAC breakfast    loratadine  10 mg Oral Daily   lubiprostone  8 mcg Oral BID WC   mupirocin ointment  1 Application Nasal BID   pantoprazole  40 mg Oral Daily   polyethylene glycol  17 g Oral Daily   sodium chloride flush  3 mL Intravenous Q12H   tamsulosin  0.8 mg Oral QPM   Continuous Infusions:     LOS: 3 days    Time spent: 50 minutes    Vassie Loll, MD [facility]  If 7PM-7AM, please contact night-coverage [email or web address] 10/27/2023, 6:29 PM

## 2023-10-27 NOTE — TOC Progression Note (Signed)
Transition of Care [facility]) - Progression Note    Patient Details  Name: Shane Chambers MRN: [id number] Date of Birth: 04-15-1961  Transition of [facility] CM/SW Contact  Leitha Bleak, RN Phone Number: 10/27/2023, 11:52 AM  Clinical Narrative:   Jory Sims updating Revonda Standard at [facility] for possible DC back tomorrow. Due to patient's SI ideation he will need [facility] evaluation before he can return. Orders has been placed. TOC following.    Expected Discharge Plan: Long Term Nursing Home Barriers to Discharge: Continued Medical Work up  Expected Discharge Plan and Services In-house Referral: Clinical Social Work Discharge Planning Services: CM Consult Post Acute Care Choice: Skilled Nursing Facility Living arrangements for the past 2 months: Skilled Nursing Facility                    Social Determinants of Health (SDOH) Interventions SDOH Screenings   Food Insecurity: No Food Insecurity (10/24/2023)  Housing: Low Risk  (10/24/2023)  Transportation Needs: No Transportation Needs (10/24/2023)  Utilities: Not At Risk (10/24/2023)  Alcohol Screen: Low Risk  (11/05/2022)  Tobacco Use: Medium Risk (10/24/2023)    Readmission Risk Interventions    10/25/2023   10:46 AM 02/19/2023    1:52 PM 02/15/2023   11:15 AM  Readmission Risk Prevention Plan  Transportation Screening Complete Complete Complete  Medication Review Oceanographer) Complete Complete Complete  PCP or Specialist appointment within 3-5 days of discharge  Complete   HRI or Home Care Consult Complete Complete Complete  SW Recovery Care/Counseling Consult Complete Complete Complete  Palliative Care Screening Not Applicable Not Applicable Not Applicable  Skilled Nursing Facility Complete Complete Complete

## 2023-10-28 DIAGNOSIS — E039 Hypothyroidism, unspecified: Secondary | ICD-10-CM | POA: Diagnosis not present

## 2023-10-28 DIAGNOSIS — J9621 Acute and chronic respiratory failure with hypoxia: Secondary | ICD-10-CM | POA: Diagnosis not present

## 2023-10-28 DIAGNOSIS — N179 Acute kidney failure, unspecified: Secondary | ICD-10-CM | POA: Diagnosis not present

## 2023-10-28 DIAGNOSIS — I5033 Acute on chronic diastolic (congestive) heart failure: Secondary | ICD-10-CM | POA: Diagnosis not present

## 2023-10-28 LAB — BASIC METABOLIC PANEL
Anion gap: 10 (ref 5–15)
BUN: 61 mg/dL — ABNORMAL HIGH (ref 6–20)
CO2: 33 mmol/L — ABNORMAL HIGH (ref 22–32)
Calcium: 8.8 mg/dL — ABNORMAL LOW (ref 8.9–10.3)
Chloride: 97 mmol/L — ABNORMAL LOW (ref 98–111)
Creatinine, Ser: 2.21 mg/dL — ABNORMAL HIGH (ref 0.61–1.24)
GFR, Estimated: 33 mL/min — ABNORMAL LOW (ref >60)
Glucose, Bld: 108 mg/dL — ABNORMAL HIGH (ref 70–99)
Potassium: 3.7 mmol/L (ref 3.5–5.1)
Sodium: 140 mmol/L (ref 135–145)

## 2023-10-28 MED ORDER — MELATONIN 3 MG PO TABS
6.0000 mg | ORAL_TABLET | Freq: Every day | ORAL | Status: DC
  Administered 2023-10-28 – 2023-10-30 (×4): 6 mg via ORAL
  Filled 2023-10-28 (×4): qty 2

## 2023-10-28 MED ORDER — TRAZODONE HCL 50 MG PO TABS
50.0000 mg | ORAL_TABLET | Freq: Every evening | ORAL | Status: DC | PRN
  Filled 2023-10-28: qty 1

## 2023-10-28 NOTE — Plan of Care (Signed)
  Problem: Education: Goal: Knowledge of General Education information will improve Description: Including pain rating scale, medication(s)/side effects and non-pharmacologic comfort measures Outcome: Progressing   Problem: Clinical Measurements: Goal: Ability to maintain clinical measurements within normal limits will improve Outcome: Progressing   Problem: Safety: Goal: Ability to remain free from injury will improve Outcome: Progressing   Problem: Skin Integrity: Goal: Risk for impaired skin integrity will decrease Outcome: Progressing   Problem: Clinical Measurements: Goal: Remain free from any harm during hospitalization Outcome: Progressing   Problem: Nutrition: Goal: Adequate fluids and nutrition will be maintained Outcome: Progressing   Problem: Sleep Hygiene: Goal: Ability to obtain adequate restful sleep will improve Outcome: Progressing

## 2023-10-28 NOTE — Plan of Care (Signed)

## 2023-10-28 NOTE — Progress Notes (Signed)
   10/28/23 1401  Spiritual Encounters  Type of Visit Initial  Care provided to: Patient  Conversation partners present during encounter Nurse  Referral source Chaplain team  Reason for visit Routine spiritual support  OnCall Visit No   Chaplain attempting to visit Pt at the recommendation of lead chaplain.  Chaplain made 2 attempts to visit Pt as 1405 and 1530 and Pt was sleeping very deeply both times.  Pt sitter was present and said he had been sleeping a lot today and suggested it might be his medication. Chaplain will continue to follow Pt and hope to connect with him tomorrow.

## 2023-10-28 NOTE — Progress Notes (Signed)
PROGRESS NOTE    Shane Chambers  ZOX:[id number] DOB: May 22, 1961 DOA: 10/24/2023 PCP: [name], MD   Brief Narrative:    Shane Chambers is a 62 y.o. male with medical history significant for hypertension, type 2 diabetes, COPD on baseline 2 L nasal cannula, dyslipidemia, CAD, diastolic CHF, hypothyroidism, depression, BPH, CKD 3 AAA, right BKA, and rectal cancer in 2022 who presented to the ED with worsening shortness of breath from SNF.   Assessment & Plan:   Principal Problem:   Acute on chronic hypoxic respiratory failure (HCC) Active Problems:   Essential hypertension   COPD (chronic obstructive pulmonary disease) (HCC)   Type 2 diabetes mellitus without complications (HCC)   CKD stage 3a, GFR 45-59 ml/min (HCC)   BPH (benign prostatic hyperplasia)   Normocytic anemia   AKI (acute kidney injury) (HCC)   Morbid obesity (HCC)   Acquired hypothyroidism   Dyslipidemia  Assessment and Plan:  Acute on chronic hypoxemic respiratory failure secondary to acute diastolic CHF exacerbation -2D echocardiogram 06/2023 showed EF 60-65% -Repeat limited 2D echocardiogram 12/14 showing LVEF 55-60% with grade 3 diastolic dysfunction -wilI continue IV diuresis. -Continue to wean oxygen to baseline 2 L nasal cannula as tolerated. -REDs above 40   AKI on CKD stage IIIa -Continue currently improving from baseline with diuresis. -Likely cardiorenal secondary to volume overload from above -Continue to monitor strict I's and O's -Continue to minimize nephrotoxic agents avoid the use of contrast and hypotension. -Creatinine down to 2.21 -Good urine output appreciated. -Will continue diuresis.   Hypertension -Continue to hold amlodipine -Continue IV diuresis and follow vital signs. -Vital signs stable for the most part -Heart healthy/low-sodium diet discussed with patient.   COPD -Currently without signs of acute exacerbation -Continue as needed bronchodilator management.    Dyslipidemia -Continue statin -Heart healthy diet has been ordered.   Hypothyroidism -Continue levothyroxine   Type 2 diabetes -Continue SSI and Carb modified diet -Follow CBG fluctuation and adjust hypoglycemic regimen as required.   Iron deficiency anemia -Continue iron supplementation -No signs of overt bleeding -Continue to follow hemoglobin trend.   BPH with chronic Foley catheter -Continue Flomax -No complaints of urinary retention currently.   Morbid obesity -BMI 42.04 -Low-calorie diet and portion control discussed with patient.  Depression/suicidal ideation -Patient reports no suicidal ideation or hallucination. -Flat affect appreciated on exam. -Will follow recommendation by psychiatry service. -Continue sitter.   DVT prophylaxis:Heparin Code Status: Full Family Communication: None at bedside Disposition Plan: home  Status is: Inpatient Remains inpatient appropriate because: Need for IV diuresis.  Consultants:  None  Procedures:  None  Antimicrobials:  None   Subjective: 3.5 L nasal cannula supplementation in place; patient reports no chest pain, no nausea, no vomiting, no fever.  Objective: Vitals:   10/27/23 2205 10/28/23 0420 10/28/23 0549 10/28/23 1333  BP:  (!) 131/54  137/63  Pulse:  66  64  Resp:    19  Temp:  98.2 F (36.8 C)  98.4 F (36.9 C)  TempSrc:  Oral  Oral  SpO2:  96%  97%  Weight: 112.4 kg  112.7 kg   Height:        Intake/Output Summary (Last 24 hours) at 10/28/2023 1814 Last data filed at 10/28/2023 1807 Gross per 24 hour  Intake 1530 ml  Output 4000 ml  Net -2470 ml   Filed Weights   10/27/23 0451 10/27/23 2205 10/28/23 0549  Weight: 104.8 kg 112.4 kg 112.7 kg    Examination: General  exam: Alert, awake, oriented x 3; more interactive; expressing no suicidal ideation.  Patient denies chest pain. Respiratory system: Decreased breath sounds appreciated at the bases; positive rhonchi bilaterally.  No using  accessory muscles. Cardiovascular system: Rate controlled, no rubs, no gallops, positive murmur. Gastrointestinal system: Abdomen is obese, nondistended, soft and nontender.  Positive bowel sounds.  Increased abdominal girth appreciated on exam. Central nervous system:  No focal neurological deficits. Extremities: No cyanosis or clubbing; positive 1+ edema on his left lower extremity; right BKA. Skin: No petechiae. Psychiatry: Flat affect appreciated on exam; no suicidal ideation hallucination.   Data Reviewed: I have personally reviewed following labs and imaging studies  CBC: Recent Labs  Lab 10/24/23 1007 10/26/23 0448  WBC 11.6* 10.7*  NEUTROABS 9.5*  --   HGB 8.4* 8.0*  HCT 28.3* 25.7*  MCV 102.9* 100.0  PLT 246 231   Basic Metabolic Panel: Recent Labs  Lab 10/24/23 1007 10/25/23 0446 10/26/23 0448 10/27/23 0415 10/28/23 0439  NA 138 135 138 141 140  K 5.4* 5.0 4.6 4.0 3.7  CL 102 101 100 99 97*  CO2 26 22 25  33* 33*  GLUCOSE 141* 130* 104* 138* 108*  BUN 63* 68* 69* 64* 61*  CREATININE 3.66* 3.63* 3.19* 2.44* 2.21*  CALCIUM 8.5* 8.2* 8.6* 8.9 8.8*  MG  --   --  1.5*  --   --    GFR: Estimated Creatinine Clearance: 44.7 mL/min (A) (by C-G formula based on SCr of 2.21 mg/dL (H)).  CBG: Recent Labs  Lab 10/26/23 0610  GLUCAP 116*   Recent Results (from the past 240 hours)  Resp panel by RT-PCR (RSV, Flu A&B, Covid) Anterior Nasal Swab     Status: None   Collection Time: 10/24/23 10:11 AM   Specimen: Anterior Nasal Swab  Result Value Ref Range Status   SARS Coronavirus 2 by RT PCR NEGATIVE NEGATIVE Final    Comment: (NOTE) SARS-CoV-2 target nucleic acids are NOT DETECTED.  The SARS-CoV-2 RNA is generally detectable in upper respiratory specimens during the acute phase of infection. The lowest concentration of SARS-CoV-2 viral copies this assay can detect is 138 copies/mL. A negative result does not preclude SARS-Cov-2 infection and should not be used  as the sole basis for treatment or other patient management decisions. A negative result may occur with  improper specimen collection/handling, submission of specimen other than nasopharyngeal swab, presence of viral mutation(s) within the areas targeted by this assay, and inadequate number of viral copies(<138 copies/mL). A negative result must be combined with clinical observations, patient history, and epidemiological information. The expected result is Negative.  Fact Sheet for Patients:  BloggerCourse.com  Fact Sheet for Healthcare Providers:  SeriousBroker.it  This test is no t yet approved or cleared by the [place] FDA and  has been authorized for detection and/or diagnosis of SARS-CoV-2 by FDA under an Emergency Use Authorization (EUA). This EUA will remain  in effect (meaning this test can be used) for the duration of the COVID-19 declaration under Section 564(b)(1) of the Act, 21 U.S.C.section 360bbb-3(b)(1), unless the authorization is terminated  or revoked sooner.       Influenza A by PCR NEGATIVE NEGATIVE Final   Influenza B by PCR NEGATIVE NEGATIVE Final    Comment: (NOTE) The Xpert Xpress SARS-CoV-2/FLU/RSV plus assay is intended as an aid in the diagnosis of influenza from Nasopharyngeal swab specimens and should not be used as a sole basis for treatment. Nasal washings and aspirates are unacceptable  for Xpert Xpress SARS-CoV-2/FLU/RSV testing.  Fact Sheet for Patients: BloggerCourse.com  Fact Sheet for Healthcare Providers: SeriousBroker.it  This test is not yet approved or cleared by the [place] FDA and has been authorized for detection and/or diagnosis of SARS-CoV-2 by FDA under an Emergency Use Authorization (EUA). This EUA will remain in effect (meaning this test can be used) for the duration of the COVID-19 declaration under Section 564(b)(1)  of the Act, 21 U.S.C. section 360bbb-3(b)(1), unless the authorization is terminated or revoked.     Resp Syncytial Virus by PCR NEGATIVE NEGATIVE Final    Comment: (NOTE) Fact Sheet for Patients: BloggerCourse.com  Fact Sheet for Healthcare Providers: SeriousBroker.it  This test is not yet approved or cleared by the [place] FDA and has been authorized for detection and/or diagnosis of SARS-CoV-2 by FDA under an Emergency Use Authorization (EUA). This EUA will remain in effect (meaning this test can be used) for the duration of the COVID-19 declaration under Section 564(b)(1) of the Act, 21 U.S.C. section 360bbb-3(b)(1), unless the authorization is terminated or revoked.  Performed at [facility], [address] [place], [place] [address]   MRSA Next Gen by PCR, Nasal     Status: Abnormal   Collection Time: 10/24/23 12:40 PM   Specimen: Anterior Nasal Swab  Result Value Ref Range Status   MRSA by PCR Next Gen DETECTED (A) NOT DETECTED Final    Comment: RESULT CALLED TO, READ BACK BY AND VERIFIED WITH: R TEJEDA AT 1613 ON [id number] BY S DALTON (NOTE) The GeneXpert MRSA Assay (FDA approved for NASAL specimens only), is one component of a comprehensive MRSA colonization surveillance program. It is not intended to diagnose MRSA infection nor to guide or monitor treatment for MRSA infections. Test performance is not FDA approved in patients less than 31 years old. Performed at [facility], [address] [place], [place] [address]      Radiology Studies: No results found.   Scheduled Meds:  aspirin EC  81 mg Oral Q breakfast   atorvastatin  40 mg Oral QHS   ferrous gluconate  324 mg Oral Q breakfast   furosemide  80 mg Intravenous Q12H   gabapentin  400 mg Oral QHS   heparin  5,000 Units Subcutaneous Q8H   levothyroxine  75 mcg Oral QAC breakfast   loratadine  10 mg Oral Daily   lubiprostone  8 mcg Oral BID WC    melatonin  6 mg Oral QHS   mupirocin ointment  1 Application Nasal BID   pantoprazole  40 mg Oral Daily   polyethylene glycol  17 g Oral Daily   sodium chloride flush  3 mL Intravenous Q12H   tamsulosin  0.8 mg Oral QPM   Continuous Infusions:     LOS: 4 days    Time spent: 50 minutes    Vassie Loll, MD [facility]  If 7PM-7AM, please contact night-coverage [email or web address] 10/28/2023, 6:14 PM

## 2023-10-29 DIAGNOSIS — E039 Hypothyroidism, unspecified: Secondary | ICD-10-CM | POA: Diagnosis not present

## 2023-10-29 DIAGNOSIS — I5033 Acute on chronic diastolic (congestive) heart failure: Secondary | ICD-10-CM | POA: Diagnosis not present

## 2023-10-29 DIAGNOSIS — N179 Acute kidney failure, unspecified: Secondary | ICD-10-CM | POA: Diagnosis not present

## 2023-10-29 DIAGNOSIS — J9621 Acute and chronic respiratory failure with hypoxia: Secondary | ICD-10-CM | POA: Diagnosis not present

## 2023-10-29 LAB — BASIC METABOLIC PANEL
Anion gap: 12 (ref 5–15)
BUN: 55 mg/dL — ABNORMAL HIGH (ref 6–20)
CO2: 35 mmol/L — ABNORMAL HIGH (ref 22–32)
Calcium: 8.8 mg/dL — ABNORMAL LOW (ref 8.9–10.3)
Chloride: 94 mmol/L — ABNORMAL LOW (ref 98–111)
Creatinine, Ser: 2.1 mg/dL — ABNORMAL HIGH (ref 0.61–1.24)
GFR, Estimated: 35 mL/min — ABNORMAL LOW (ref >60)
Glucose, Bld: 88 mg/dL (ref 70–99)
Potassium: 3.9 mmol/L (ref 3.5–5.1)
Sodium: 141 mmol/L (ref 135–145)

## 2023-10-29 NOTE — Progress Notes (Signed)
Brenton Grills from [facility] reached out regarding patient Psych consult. Per her, they are unable to do assessment on patient while he is on medical floor.

## 2023-10-29 NOTE — Progress Notes (Signed)
PROGRESS NOTE    Shane MACKOWIAK  Chambers:[id number] DOB: 04-Feb-1961 DOA: 10/24/2023 PCP: [name], MD   Brief Narrative:    Shane Chambers is a 62 y.o. male with medical history significant for hypertension, type 2 diabetes, COPD on baseline 2 L nasal cannula, dyslipidemia, CAD, diastolic CHF, hypothyroidism, depression, BPH, CKD 3 AAA, right BKA, and rectal cancer in 2022 who presented to the ED with worsening shortness of breath from [facility].   Assessment & Plan:   Principal Problem:   Acute on chronic hypoxic respiratory failure (HCC) Active Problems:   Essential hypertension   COPD (chronic obstructive pulmonary disease) (HCC)   Type 2 diabetes mellitus without complications (HCC)   CKD stage 3a, GFR 45-59 ml/min (HCC)   BPH (benign prostatic hyperplasia)   Normocytic anemia   AKI (acute kidney injury) (HCC)   Morbid obesity (HCC)   Acquired hypothyroidism   Dyslipidemia  Assessment and Plan:  Acute on chronic hypoxemic respiratory failure secondary to acute diastolic CHF exacerbation -2D echocardiogram 06/2023 showed EF 60-65% -Repeat limited 2D echocardiogram 12/14 showing LVEF 55-60% with grade 3 diastolic dysfunction -wilI continue IV diuresis. -Continue to wean oxygen to baseline 2 L nasal cannula as tolerated. -REDs above 40; but with low reading quality (most likely due to body habitus.   AKI on CKD stage IIIa -Continue currently improving from baseline with diuresis. -Likely cardiorenal secondary to volume overload from above -Continue to monitor strict I's and O's -Continue to minimize nephrotoxic agents avoid the use of contrast and hypotension. -Creatinine down to 2.10 -Good urine output appreciated. -Will continue diuresis.   Hypertension -Continue to hold amlodipine -Continue IV diuresis and follow vital signs. -Vital signs stable for the most part -Heart healthy/low-sodium diet discussed with patient.   COPD -Currently without signs of acute  exacerbation -Continue as needed bronchodilator management.   Dyslipidemia -Continue statin -Heart healthy diet has been ordered.   Hypothyroidism -Continue levothyroxine   Type 2 diabetes -Continue SSI and Carb modified diet -Follow CBG fluctuation and adjust hypoglycemic regimen as required.   Iron deficiency anemia -Continue iron supplementation -No signs of overt bleeding -Continue to follow hemoglobin trend.   BPH with chronic Foley catheter -Continue Flomax -No complaints of urinary retention currently.   Morbid obesity -BMI 42.04 -Low-calorie diet and portion control discussed with patient.  Depression/suicidal ideation -Patient reports no suicidal ideation or hallucination; expressed not having any plans at the moment. -Flat affect and depressed mood appreciated on exam. -Will follow recommendation by psychiatry service. -Continue sitter for now..   DVT prophylaxis:Heparin Code Status: Full Family Communication: None at bedside Disposition Plan: home  Status is: Inpatient Remains inpatient appropriate because: Need for IV diuresis.  Consultants:  None  Procedures:  None  Antimicrobials:  None   Subjective: Good urine output; using 3-3.5 L nasal cannula supplementation and expressing improvement in his respiratory status.  Patient with flat affect but no suicidal ideation.  Objective: Vitals:   10/28/23 0549 10/28/23 1333 10/28/23 2053 10/29/23 0524  BP:  137/63 (!) 147/56 (!) 139/51  Pulse:  64 64 61  Resp:  19 20 20   Temp:  98.4 F (36.9 C) 98.3 F (36.8 C) 98.3 F (36.8 C)  TempSrc:  Oral    SpO2:  97% 99% 97%  Weight: 112.7 kg   101.9 kg  Height:        Intake/Output Summary (Last 24 hours) at 10/29/2023 1638 Last data filed at 10/29/2023 1300 Gross per 24 hour  Intake  1410 ml  Output 3150 ml  Net -1740 ml   Filed Weights   10/27/23 2205 10/28/23 0549 10/29/23 0524  Weight: 112.4 kg 112.7 kg 101.9 kg    Examination: General  exam: Alert, awake, oriented x 3; no chest pain, no nausea, no vomiting.  Overall feeling better and more interactive.  3.5 L nasal cannula supplementation in place. Respiratory system: Decreased breath sounds at the bases appreciated; no using accessory muscle. Cardiovascular system:RRR. No murmurs, rubs, gallops. Gastrointestinal system: Abdomen is obese, nondistended, soft and nontender.  Positive bowel sounds; significant improvement on abdominal girth/swelling. Central nervous system:No focal neurological deficits. Extremities: No cyanosis or clubbing; 1+ edema left lower extremity.  Right BKA. Skin: No petechiae. Psychiatry: No suicidal ideation or hallucinations; flat affect.   General exam: Alert, awake, oriented x 3; more interactive; expressing no suicidal ideation.  Patient denies chest pain. Respiratory system: Decreased breath sounds appreciated at the bases; positive rhonchi bilaterally.  No using accessory muscles. Cardiovascular system: Rate controlled, no rubs, no gallops, positive murmur. Gastrointestinal system: Abdomen is obese, nondistended, soft and nontender.  Positive bowel sounds.  Increased abdominal girth appreciated on exam. Central nervous system:  No focal neurological deficits. Extremities: No cyanosis or clubbing; positive 1+ edema on his left lower extremity; right BKA. Skin: No petechiae. Psychiatry: Flat affect appreciated on exam; no suicidal ideation hallucination.   Data Reviewed: I have personally reviewed following labs and imaging studies  CBC: Recent Labs  Lab 10/24/23 1007 10/26/23 0448  WBC 11.6* 10.7*  NEUTROABS 9.5*  --   HGB 8.4* 8.0*  HCT 28.3* 25.7*  MCV 102.9* 100.0  PLT 246 231   Basic Metabolic Panel: Recent Labs  Lab 10/25/23 0446 10/26/23 0448 10/27/23 0415 10/28/23 0439 10/29/23 0417  NA 135 138 141 140 141  K 5.0 4.6 4.0 3.7 3.9  CL 101 100 99 97* 94*  CO2 22 25 33* 33* 35*  GLUCOSE 130* 104* 138* 108* 88  BUN 68*  69* 64* 61* 55*  CREATININE 3.63* 3.19* 2.44* 2.21* 2.10*  CALCIUM 8.2* 8.6* 8.9 8.8* 8.8*  MG  --  1.5*  --   --   --    GFR: Estimated Creatinine Clearance: 44.8 mL/min (A) (by C-G formula based on SCr of 2.1 mg/dL (H)).  CBG: Recent Labs  Lab 10/26/23 0610  GLUCAP 116*   Recent Results (from the past 240 hours)  Resp panel by RT-PCR (RSV, Flu A&B, Covid) Anterior Nasal Swab     Status: None   Collection Time: 10/24/23 10:11 AM   Specimen: Anterior Nasal Swab  Result Value Ref Range Status   SARS Coronavirus 2 by RT PCR NEGATIVE NEGATIVE Final    Comment: (NOTE) SARS-CoV-2 target nucleic acids are NOT DETECTED.  The SARS-CoV-2 RNA is generally detectable in upper respiratory specimens during the acute phase of infection. The lowest concentration of SARS-CoV-2 viral copies this assay can detect is 138 copies/mL. A negative result does not preclude SARS-Cov-2 infection and should not be used as the sole basis for treatment or other patient management decisions. A negative result may occur with  improper specimen collection/handling, submission of specimen other than nasopharyngeal swab, presence of viral mutation(s) within the areas targeted by this assay, and inadequate number of viral copies(<138 copies/mL). A negative result must be combined with clinical observations, patient history, and epidemiological information. The expected result is Negative.  Fact Sheet for Patients:  BloggerCourse.com  Fact Sheet for Healthcare Providers:  SeriousBroker.it  This test  is no t yet approved or cleared by the [place] and  has been authorized for detection and/or diagnosis of SARS-CoV-2 by FDA under an Emergency Use Authorization (EUA). This EUA will remain  in effect (meaning this test can be used) for the duration of the COVID-19 declaration under Section 564(b)(1) of the Act, 21 U.S.C.section 360bbb-3(b)(1), unless  the authorization is terminated  or revoked sooner.       Influenza A by PCR NEGATIVE NEGATIVE Final   Influenza B by PCR NEGATIVE NEGATIVE Final    Comment: (NOTE) The Xpert Xpress SARS-CoV-2/FLU/RSV plus assay is intended as an aid in the diagnosis of influenza from Nasopharyngeal swab specimens and should not be used as a sole basis for treatment. Nasal washings and aspirates are unacceptable for Xpert Xpress SARS-CoV-2/FLU/RSV testing.  Fact Sheet for Patients: BloggerCourse.com  Fact Sheet for Healthcare Providers: SeriousBroker.it  This test is not yet approved or cleared by the [place] FDA and has been authorized for detection and/or diagnosis of SARS-CoV-2 by FDA under an Emergency Use Authorization (EUA). This EUA will remain in effect (meaning this test can be used) for the duration of the COVID-19 declaration under Section 564(b)(1) of the Act, 21 U.S.C. section 360bbb-3(b)(1), unless the authorization is terminated or revoked.     Resp Syncytial Virus by PCR NEGATIVE NEGATIVE Final    Comment: (NOTE) Fact Sheet for Patients: BloggerCourse.com  Fact Sheet for Healthcare Providers: SeriousBroker.it  This test is not yet approved or cleared by the [place] FDA and has been authorized for detection and/or diagnosis of SARS-CoV-2 by FDA under an Emergency Use Authorization (EUA). This EUA will remain in effect (meaning this test can be used) for the duration of the COVID-19 declaration under Section 564(b)(1) of the Act, 21 U.S.C. section 360bbb-3(b)(1), unless the authorization is terminated or revoked.  Performed at [facility], [address], [place], [place] [address]   MRSA Next Gen by PCR, Nasal     Status: Abnormal   Collection Time: 10/24/23 12:40 PM   Specimen: Anterior Nasal Swab  Result Value Ref Range Status   MRSA by PCR Next Gen DETECTED  (A) NOT DETECTED Final    Comment: RESULT CALLED TO, READ BACK BY AND VERIFIED WITH: R TEJEDA AT 1613 ON [id number] BY S DALTON (NOTE) The GeneXpert MRSA Assay (FDA approved for NASAL specimens only), is one component of a comprehensive MRSA colonization surveillance program. It is not intended to diagnose MRSA infection nor to guide or monitor treatment for MRSA infections. Test performance is not FDA approved in patients less than 94 years old. Performed at [facility], [address], [place], [place] [address]      Radiology Studies: No results found.   Scheduled Meds:  aspirin EC  81 mg Oral Q breakfast   atorvastatin  40 mg Oral QHS   ferrous gluconate  324 mg Oral Q breakfast   furosemide  80 mg Intravenous Q12H   gabapentin  400 mg Oral QHS   heparin  5,000 Units Subcutaneous Q8H   levothyroxine  75 mcg Oral QAC breakfast   loratadine  10 mg Oral Daily   lubiprostone  8 mcg Oral BID WC   melatonin  6 mg Oral QHS   mupirocin ointment  1 Application Nasal BID   pantoprazole  40 mg Oral Daily   polyethylene glycol  17 g Oral Daily   sodium chloride flush  3 mL Intravenous Q12H   tamsulosin  0.8 mg  Oral QPM   Continuous Infusions:     LOS: 5 days    Time spent: 50 minutes    Vassie Loll, MD [facility]  If 7PM-7AM, please contact night-coverage [email or web address] 10/29/2023, 4:38 PM

## 2023-10-30 DIAGNOSIS — I5033 Acute on chronic diastolic (congestive) heart failure: Secondary | ICD-10-CM | POA: Diagnosis not present

## 2023-10-30 DIAGNOSIS — E039 Hypothyroidism, unspecified: Secondary | ICD-10-CM | POA: Diagnosis not present

## 2023-10-30 DIAGNOSIS — N179 Acute kidney failure, unspecified: Secondary | ICD-10-CM | POA: Diagnosis not present

## 2023-10-30 DIAGNOSIS — J9621 Acute and chronic respiratory failure with hypoxia: Secondary | ICD-10-CM | POA: Diagnosis not present

## 2023-10-30 LAB — GLUCOSE, CAPILLARY: Glucose-Capillary: 93 mg/dL (ref 70–99)

## 2023-10-30 NOTE — BH Assessment (Addendum)
Comprehensive Clinical Assessment (CCA) Note  10/30/2023 Shane Chambers [id number]  DISPOSITION: Patient does not meet criteria for an inpatient admission and will be cleared by psychiatry this date.   The patient demonstrates the following risk factors for suicide: Chronic risk factors for suicide include: N/A. Acute risk factors for suicide include: N/A. Protective factors for this patient include: coping skills. Considering these factors, the overall suicide risk at this point appears to be low. Patient is appropriate for outpatient follow up.    Shane Chambers is a 62 y.o. male with medical history significant for hypertension, type 2 diabetes, COPD on baseline 2 L nasal cannula, dyslipidemia, CAD, diastolic CHF, hypothyroidism, depression, BPH, CKD 3 AAA, right BKA, and rectal cancer in 2022 who presented to the ED with worsening shortness of breath from SNF. (Per notes).   Patient had voiced SI on 10/27/2023: Per note as of that date by Hairston RN: "Entered patient room at 2300 due to secretary stated that patient called down to the E.D. department stating he wanted to harm himself. Talking with patient, patient stated, "I want to close my eyes and not wake up." Patient does not have a plan. Patient is not homicidal. Patient states he does have a history of suicidal ideation and has attempted in the past. Provider notified. Patient placed on 1:1 observation. Necessary items removed from room. Patient searched. $141.00 found in patient bag and locked up with security. Patient phone placed in his personal bag and bag placed in medication room."   DISCHARGE SUMMARY FROM [facility] MD ON 11/06/22: Patient this date denies any S/I, H/I or AVH. Patient is contacting for safety reporting when questioned in reference to the above statement made on 12/17 patient stated this date, "I was frustrated over my health issues and said, I didn't want to wake up although I didn't mean I was going to end my life."  Patient this date is denying any immediate plan, intent or thoughts of self harm. Patient does have a prior history of S/I and was admitted inpatient after his BKA in December of 2023 after making similar statements although per chart review patient did not voice a plan at that time either. Patient seen and chart reviewed. 62 year old man with a history of multiple medical problems and depression. Admitted to the hospital in [place] on the 18th after presenting from his living facility with complaints of suicidal thoughts. Patient required some medical stabilization. Still reporting being depressed. Patient reports that his mood stays depressed down and negative. He says he has felt this way almost continuously for 50 years. He has had multiple trials of medication with only partial benefit. He has recently had some suicidal thoughts without any specific plan to act on it. He attributes a lot of these to the difficulty of living in the facility where he has been housed recently. He claims they have been "abusive" to him. Patient has multiple medical issues. 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 Diabetes high blood pressure obesity hypothyroidism chronic constipation and difficulty with bowel functioning. Patient had recently been started on Effexor and I believe also Abilify. There was some concern at the outside hospital about lengthening QT interval because apparently he had an episode of arrhythmia. Because of this psychiatric medicines had been discontinued.   Patient is currently residing at a SNF and will return after discharge. Patient denies access to weapons. Patient denies any SA issues. Patient states he has been  diagnosed with depression and receives services through his provider at that location. Patient states he was on Latuda (See MAR) in the spring on 2024 although patient reports that medication was discontinued due to having side effects that interfered  with his other medical medications. Patient is observed this date to be a poor historian and is very sleepy rending limited information. Patient does report he is ready to be discharged back to his SNF this date and is contracting for safety.      Patient is observed to be very sleepy and renders limited history this date. Patient denies any S/I, H/I or AVH. Patient is alert and oriented x 3. Patient speaks in a low soft voice that is difficult to understand at times. Patient's memory appears to be intact with thoughts organized. Patient's mood is pleasant with affect congruent. Patient does not appear to be responding to internal stimuli.    Chief Complaint:   Visit Diagnosis: MDD recurrent without psychotic features    CCA Screening, Triage and Referral (STR)  Patient Reported Information How did you hear about [place]? Self  What Is the Reason for Your Visit/Call Today? Shane Chambers is a 62 y.o. male with medical history significant for hypertension, type 2 diabetes, COPD on baseline 2 L nasal cannula, dyslipidemia, CAD, diastolic CHF, hypothyroidism, depression, BPH, CKD 3 AAA, right BKA, and rectal cancer in 2022 who presented to the ED with worsening shortness of breath from SNF. Per notes patient had voiced S/I on 12/16  How Long Has This Been Causing You Problems? 1 wk - 1 month  What Do You Feel Would Help You the Most Today? Treatment for Depression or other mood problem   Have You Recently Had Any Thoughts About Hurting Yourself? Yes  Are You Planning to Commit Suicide/Harm Yourself At This time? No   Flowsheet Row ED to Hosp-Admission (Current) from 10/24/2023 in [facility] ED to Hosp-Admission (Discharged) from 07/30/2023 in [facility] 1C MEDICAL TELEMETRY ED from 04/16/2023 in [facility] Emergency Department at [facility]  C-SSRS RISK CATEGORY Error: Question 1 not populated No Risk No Risk       Have you Recently Had  Thoughts About Hurting Someone Karolee Ohs? No  Are You Planning to Harm Someone at This Time? No  Explanation: NA   Have You Used Any Alcohol or Drugs in the Past 24 Hours? No What Did You Use and How Much? NA  Do You Currently Have a Therapist/Psychiatrist? No Name of Therapist/Psychiatrist: Name of Therapist/Psychiatrist: Pt states he does not hvae any psych services at this time.   Have You Been Recently Discharged From Any Office Practice or Programs? No  Explanation of Discharge From Practice/Program: NA     CCA Screening Triage Referral Assessment Type of Contact: Tele-Assessment  Telemedicine Service Delivery: Telemedicine service delivery: This service was provided via telemedicine using a 2-way, interactive audio and video technology  Is this Initial or Reassessment? Is this Initial or Reassessment?: Initial Assessment  Date Telepsych consult ordered in [facility]:  Date Telepsych consult ordered in [facility]: 10/29/23  Time Telepsych consult ordered in [facility]:  Time Telepsych consult ordered in [facility]: 1100  Location of Assessment: [facility] ED  Provider Location: [facility] Assessment Services   Collateral Involvement: None at this time   Does Patient Have a Automotive engineer Guardian? Yes (Per notes) Other: (Unknown at this time)  Legal Guardian Contact Information: Unavailable at this time  Copy of Legal Guardianship  Form: -- (Unavailable at this time)  Legal Guardian Notified of Arrival: -- (Unavailable at this time)  Legal Guardian Notified of Pending Discharge: -- (Unavailable at this time)  If Minor and Not Living with Parent(s), Who has Custody? NA  Is CPS involved or ever been involved? Never  Is APS involved or ever been involved? Never   Patient Determined To Be At Risk for Harm To Self or Others Based on Review of Patient Reported Information or Presenting Complaint? No  Method: No Plan  Availability of Means: No access or NA  Intent: Vague intent or  NA  Notification Required: No need or identified person  Additional Information for Danger to Others Potential: -- (NA)  Additional Comments for Danger to Others Potential: None noted  Are There Guns or Other Weapons in Your Home? No  Types of Guns/Weapons: NA  Are These Weapons Safely Secured?                            -- (NA)  Who Could Verify You Are Able To Have These Secured: NA  Do You Have any Outstanding Charges, Pending Court Dates, Parole/Probation? Pt denies  Contacted To Inform of Risk of Harm To Self or Others: Other: Comment (NA)    Does Patient Present under Involuntary Commitment? No    [place] of Residence: [place]   Patient Currently Receiving the Following Services: Not Receiving Services   Determination of Need: Routine (7 days)   Options For Referral: Outpatient Therapy     CCA Biopsychosocial Patient Reported Schizophrenia/Schizoaffective Diagnosis in Past: No   Strengths: Pt is willing to become involved in treatment   Mental Health Symptoms Depression:  Change in energy/activity; Difficulty Concentrating   Duration of Depressive symptoms: Duration of Depressive Symptoms: Greater than two weeks   Mania:  None   Anxiety:   Tension; Worrying; Difficulty concentrating   Psychosis:  None   Duration of Psychotic symptoms:    Trauma:  None   Obsessions:  None   Compulsions:  N/A   Inattention:  None   Hyperactivity/Impulsivity:  N/A   Oppositional/Defiant Behaviors:  N/A   Emotional Irregularity:  Chronic feelings of emptiness   Other Mood/Personality Symptoms:  NA    Mental Status Exam Appearance and self-care  Stature:  Average   Weight:  Average weight   Clothing:  -- (In scrubs)   Grooming:  Normal   Cosmetic use:  None   Posture/gait:  Slumped   Motor activity:  Not Remarkable   Sensorium  Attention:  Normal   Concentration:  Normal   Orientation:  X5   Recall/memory:  Normal   Affect and  Mood  Affect:  Depressed; Flat   Mood:  Depressed   Relating  Eye contact:  Avoided   Facial expression:  Depressed   Attitude toward examiner:  Cooperative   Thought and Language  Speech flow: Clear and Coherent   Thought content:  Appropriate to Mood and Circumstances   Preoccupation:  None   Hallucinations:  None   Organization:  Coherent; Engineer, site of Knowledge:  Average   Intelligence:  Average   Abstraction:  Functional   Judgement:  Fair   Dance movement psychotherapist:  Realistic   Insight:  Fair   Decision Making:  Normal   Social Functioning  Social Maturity:  Isolates   Social Judgement:  Normal   Stress  Stressors:  Illness   Coping  Ability:  Overwhelmed   Skill Deficits:  Interpersonal; Decision making   Supports:  Friends/Service system     Religion: Religion/Spirituality Are You A Religious Person?: Yes What is Your Religious Affiliation?: Christian How Might This Affect Treatment?: Not at all.  Leisure/Recreation: Leisure / Recreation Do You Have Hobbies?: No  Exercise/Diet: Exercise/Diet Do You Exercise?: No Have You Gained or Lost A Significant Amount of Weight in the Past Six Months?: No Do You Follow a Special Diet?: No Do You Have Any Trouble Sleeping?: Yes Explanation of Sleeping Difficulties: Pt reports ongoing concerns over his inability to sleep more than 4 hours most nights   CCA Employment/Education Employment/Work Situation: Employment / Work Situation Employment Situation: On disability Why is Patient on Disability: Physical issues How Long has Patient Been on Disability: 12 years Patient's Job has Been Impacted by Current Illness: No Has Patient ever Been in the [employer]?: No  Education: Education Is Patient Currently Attending School?: No Last Grade Completed: 16 Did You Attend College?: Yes What Type of College Degree Do you Have?: Degree in education Did You Have An  Individualized Education Program (IIEP): No Did You Have Any Difficulty At School?: No Patient's Education Has Been Impacted by Current Illness: No   CCA Family/Childhood History Family and Relationship History: Family history Marital status: Single Does patient have children?: No  Childhood History:  Childhood History By whom was/is the patient raised?: Mother Did patient suffer any verbal/emotional/physical/sexual abuse as a child?: No Did patient suffer from severe childhood neglect?: No Has patient ever been sexually abused/assaulted/raped as an adolescent or adult?: No Was the patient ever a victim of a crime or a disaster?: No Witnessed domestic violence?: No Has patient been affected by domestic violence as an adult?: No       CCA Substance Use Alcohol/Drug Use: Alcohol / Drug Use Pain Medications: See PTA medication list Prescriptions: See PTA medication list Over the Counter: Immodium and vitamins History of alcohol / drug use?: No history of alcohol / drug abuse Longest period of sobriety (when/how long): NA Negative Consequences of Use:  (NA) Withdrawal Symptoms:  (NA)                         ASAM's:  Six Dimensions of Multidimensional Assessment  Dimension 1:  Acute Intoxication and/or Withdrawal Potential:   Dimension 1:  Description of individual's past and current experiences of substance use and withdrawal: NA  Dimension 2:  Biomedical Conditions and Complications:   Dimension 2:  Description of patient's biomedical conditions and  complications: NA  Dimension 3:  Emotional, Behavioral, or Cognitive Conditions and Complications:  Dimension 3:  Description of emotional, behavioral, or cognitive conditions and complications: NA  Dimension 4:  Readiness to Change:  Dimension 4:  Description of Readiness to Change criteria: NA  Dimension 5:  Relapse, Continued use, or Continued Problem Potential:  Dimension 5:  Relapse, continued use, or continued  problem potential critiera description: NA  Dimension 6:  Recovery/Living Environment:  Dimension 6:  Recovery/Iiving environment criteria description: NA  ASAM Severity Score:    ASAM Recommended Level of Treatment: ASAM Recommended Level of Treatment:  (NA)   Substance use Disorder (SUD) Substance Use Disorder (SUD)  Checklist Symptoms of Substance Use:  (NA)  Recommendations for Services/Supports/Treatments: Recommendations for Services/Supports/Treatments Recommendations For Services/Supports/Treatments:  (NA)  Discharge Disposition:    DSM5 Diagnoses: Patient Active Problem List   Diagnosis Date Noted   Acute on chronic hypoxic  respiratory failure (HCC) 10/24/2023   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   HLD (hyperlipidemia) 07/30/2023   Type II diabetes mellitus with renal manifestations (HCC) 07/30/2023   Chronic diastolic CHF (congestive heart failure) (HCC) 07/30/2023   Acute on chronic heart failure with preserved ejection fraction (HFpEF) (HCC) 03/27/2023   COPD with acute exacerbation (HCC) 03/27/2023   Dyslipidemia 03/27/2023   Type 2 diabetes mellitus without complications (HCC) 03/27/2023   Hypothyroidism 03/27/2023   CKD stage 3a, GFR 45-59 ml/min (HCC) 03/27/2023   Moderate aortic stenosis 03/27/2023   Acute urinary retention 03/27/2023   Hyperkalemia 02/15/2023   Pulmonary edema 02/15/2023   Bilateral pleural effusion 02/15/2023   Nausea & vomiting 02/15/2023   Chronic diarrhea 02/15/2023   Obesity (BMI 30-39.9) 02/15/2023   High anion gap metabolic acidosis 02/14/2023   S/P BKA (below knee amputation) unilateral, right (HCC) 12/17/2022   MRSA infection 11/19/2022   Amputation stump infection (HCC) 11/19/2022   QT prolongation 11/10/2022   Severe recurrent major depression without psychotic features (HCC) 11/05/2022   Major depressive disorder with psychotic features (HCC) 11/02/2022   Acute hypoactive delirium due to another medical condition 10/13/2022    Septic arthritis of right ankle (HCC) 10/04/2022   Acute osteomyelitis (HCC) 10/02/2022   Subacute osteomyelitis, right ankle and foot (HCC) 10/02/2022   Acquired hypothyroidism 10/02/2022   Hypoalbuminemia due to protein-calorie malnutrition (HCC) 10/02/2022   Cellulitis of right lower extremity 09/25/2022   Lactic acidosis 04/23/2022   Anemia of chronic disease 04/23/2022   Morbid obesity (HCC) 04/23/2022   AKI (acute kidney injury) (HCC) 04/22/2022   Elevated troponin 04/22/2022   Cancer of sigmoid colon (HCC) 12/16/2021   Constipation 12/16/2021   GERD without esophagitis 12/16/2021   Iron deficiency anemia 09/17/2021   Rectal cancer (HCC) 09/03/2021   Bilateral lower leg cellulitis 11/22/2020   Systolic and diastolic CHF, chronic (HCC) 11/22/2020   CHF exacerbation (HCC) 08/01/2020   Visit for wound check 05/28/2020   OSA (obstructive sleep apnea) 05/24/2020   COPD (chronic obstructive pulmonary disease) (HCC) 04/29/2020   Leukocytosis 04/29/2020   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   Ischemic cardiomyopathy    Coronary artery disease    Acute on chronic combined systolic and diastolic CHF (congestive heart failure) (HCC) 03/05/2020   Acute hyponatremia 03/05/2020   Acute respiratory failure with hypoxia (HCC) 12/21/2019   Severe Vitamin D deficiency 12/02/2019   Hypokalemia 12/01/2019   Mixed hyperlipidemia 12/01/2019   BPH (benign prostatic hyperplasia) 12/01/2019   Normocytic anemia 12/01/2019   Pneumonia due to COVID-19 virus 12/01/2019   Hypoxia    SOB (shortness of breath) 11/16/2019   Severe sepsis (HCC) 11/16/2019   Essential hypertension    Uncontrolled type 2 diabetes mellitus with hypoglycemia, with long-term current use of insulin (HCC)    Depression    Acute bronchitis with bronchospasm 11/15/2019     Referrals to Alternative Service(s): Referred to Alternative Service(s):   Place:   Date:   Time:    Referred to Alternative Service(s):   Place:   Date:   Time:     Referred to Alternative Service(s):   Place:   Date:   Time:    Referred to Alternative Service(s):   Place:   Date:   Time:     Alfredia Ferguson, LCAS

## 2023-10-30 NOTE — Progress Notes (Signed)
PROGRESS NOTE    CLIFFTON SWING  NFA:[id number] DOB: 10-07-1961 DOA: 10/24/2023 PCP: [name], MD   Brief Narrative:    Shane Chambers is a 62 y.o. male with medical history significant for hypertension, type 2 diabetes, COPD on baseline 2 L nasal cannula, dyslipidemia, CAD, diastolic CHF, hypothyroidism, depression, BPH, CKD 3 AAA, right BKA, and rectal cancer in 2022 who presented to the ED with worsening shortness of breath from SNF.   Assessment & Plan:   Principal Problem:   Acute on chronic hypoxic respiratory failure (HCC) Active Problems:   Essential hypertension   COPD (chronic obstructive pulmonary disease) (HCC)   Type 2 diabetes mellitus without complications (HCC)   CKD stage 3a, GFR 45-59 ml/min (HCC)   BPH (benign prostatic hyperplasia)   Normocytic anemia   AKI (acute kidney injury) (HCC)   Morbid obesity (HCC)   Acquired hypothyroidism   Dyslipidemia  Assessment and Plan:  Acute on chronic hypoxemic respiratory failure secondary to acute diastolic CHF exacerbation -2D echocardiogram 06/2023 showed EF 60-65% -Repeat limited 2D echocardiogram 12/14 showing LVEF 55-60% with grade 3 diastolic dysfunction -wilI continue IV diuresis for another 24 hours. -Continue to wean oxygen to baseline 2 L nasal cannula as tolerated. -REDs above 40; but with low reading quality (most likely due to body habitus.   AKI on CKD stage IIIa -Continue currently improving from baseline with diuresis. -Likely cardiorenal secondary to volume overload from above -Continue to monitor strict I's and O's -Continue to minimize nephrotoxic agents avoid the use of contrast and hypotension. -Creatinine down to 2.10 -Good urine output appreciated. -Will continue diuresis.   Hypertension -Continue to hold amlodipine -Continue IV diuresis and follow vital signs. -Vital signs stable for the most part -Heart healthy/low-sodium diet discussed with patient.   COPD -Currently without  signs of acute exacerbation -Continue as needed bronchodilator management.   Dyslipidemia -Continue statin -Heart healthy diet has been ordered.   Hypothyroidism -Continue levothyroxine   Type 2 diabetes -Continue SSI and Carb modified diet -Follow CBG fluctuation and adjust hypoglycemic regimen as required.   Iron deficiency anemia -Continue iron supplementation -No signs of overt bleeding -Continue to follow hemoglobin trend.   BPH with chronic Foley catheter -Continue Flomax -No complaints of urinary retention currently.   Morbid obesity -BMI 42.04 -Low-calorie diet and portion control discussed with patient.  Depression/suicidal ideation -Patient reports no suicidal ideation or hallucination; expressed not having any plans at the moment. -Flat affect and depressed mood appreciated on exam. -Will follow recommendation by psychiatry service. -Continue sitter for now..   DVT prophylaxis:Heparin Code Status: Full Family Communication: None at bedside Disposition Plan: home  Status is: Inpatient Remains inpatient appropriate because: Need for IV diuresis.  Consultants:  None  Procedures:  None  Antimicrobials:  None   Subjective: Continues to demonstrate good urine output; breathing better and requiring 2.5 L nasal cannula supplementation.  Patient expressed no chest pain, no nausea, no vomiting.  Currently no suicidal ideation.  Objective: Vitals:   10/30/23 0500 10/30/23 0610 10/30/23 0812 10/30/23 1404  BP:  (!) 154/57 (!) 144/46 139/62  Pulse:  (!) 58 (!) 55 72  Resp:      Temp:  98.7 F (37.1 C) 98.4 F (36.9 C) 98.4 F (36.9 C)  TempSrc:  Oral Oral Oral  SpO2:  97% 99% 98%  Weight: 102 kg     Height:        Intake/Output Summary (Last 24 hours) at 10/30/2023 1750 Last data  filed at 10/30/2023 1732 Gross per 24 hour  Intake 1200 ml  Output 4650 ml  Net -3450 ml   Filed Weights   10/28/23 0549 10/29/23 0524 10/30/23 0500  Weight: 112.7  kg 101.9 kg 102 kg    Examination: General exam: Alert, awake, oriented x 3; flat affect.  No chest pain, reports breathing is better.  2.5 L nasal cannula in place.  Good urine output reported. Respiratory system: Decreased breath sounds at the bases; positive rhonchi.  No using accessory muscles. Cardiovascular system:RRR. No rubs or gallops; positive murmur.  No JVD on exam. Gastrointestinal system: Abdomen is obese, nondistended, soft and nontender. No organomegaly or masses felt. Normal bowel sounds heard. Central nervous system: No focal neurological deficits. Extremities: No cyanosis or clubbing; trace edema appreciated on his left lower extremity; right BKA. Skin: No petechiae. Psychiatry: Flat affect on exam; reports no suicidal ideation.  Data Reviewed: I have personally reviewed following labs and imaging studies  CBC: Recent Labs  Lab 10/24/23 1007 10/26/23 0448  WBC 11.6* 10.7*  NEUTROABS 9.5*  --   HGB 8.4* 8.0*  HCT 28.3* 25.7*  MCV 102.9* 100.0  PLT 246 231   Basic Metabolic Panel: Recent Labs  Lab 10/25/23 0446 10/26/23 0448 10/27/23 0415 10/28/23 0439 10/29/23 0417  NA 135 138 141 140 141  K 5.0 4.6 4.0 3.7 3.9  CL 101 100 99 97* 94*  CO2 22 25 33* 33* 35*  GLUCOSE 130* 104* 138* 108* 88  BUN 68* 69* 64* 61* 55*  CREATININE 3.63* 3.19* 2.44* 2.21* 2.10*  CALCIUM 8.2* 8.6* 8.9 8.8* 8.8*  MG  --  1.5*  --   --   --    GFR: Estimated Creatinine Clearance: 44.8 mL/min (A) (by C-G formula based on SCr of 2.1 mg/dL (H)).  CBG: Recent Labs  Lab 10/26/23 0610 10/30/23 0810  GLUCAP 116* 93   Recent Results (from the past 240 hours)  Resp panel by RT-PCR (RSV, Flu A&B, Covid) Anterior Nasal Swab     Status: None   Collection Time: 10/24/23 10:11 AM   Specimen: Anterior Nasal Swab  Result Value Ref Range Status   SARS Coronavirus 2 by RT PCR NEGATIVE NEGATIVE Final    Comment: (NOTE) SARS-CoV-2 target nucleic acids are NOT DETECTED.  The  SARS-CoV-2 RNA is generally detectable in upper respiratory specimens during the acute phase of infection. The lowest concentration of SARS-CoV-2 viral copies this assay can detect is 138 copies/mL. A negative result does not preclude SARS-Cov-2 infection and should not be used as the sole basis for treatment or other patient management decisions. A negative result may occur with  improper specimen collection/handling, submission of specimen other than nasopharyngeal swab, presence of viral mutation(s) within the areas targeted by this assay, and inadequate number of viral copies(<138 copies/mL). A negative result must be combined with clinical observations, patient history, and epidemiological information. The expected result is Negative.  Fact Sheet for Patients:  BloggerCourse.com  Fact Sheet for Healthcare Providers:  SeriousBroker.it  This test is no t yet approved or cleared by the [place] FDA and  has been authorized for detection and/or diagnosis of SARS-CoV-2 by FDA under an Emergency Use Authorization (EUA). This EUA will remain  in effect (meaning this test can be used) for the duration of the COVID-19 declaration under Section 564(b)(1) of the Act, 21 U.S.C.section 360bbb-3(b)(1), unless the authorization is terminated  or revoked sooner.       Influenza A by  PCR NEGATIVE NEGATIVE Final   Influenza B by PCR NEGATIVE NEGATIVE Final    Comment: (NOTE) The Xpert Xpress SARS-CoV-2/FLU/RSV plus assay is intended as an aid in the diagnosis of influenza from Nasopharyngeal swab specimens and should not be used as a sole basis for treatment. Nasal washings and aspirates are unacceptable for Xpert Xpress SARS-CoV-2/FLU/RSV testing.  Fact Sheet for Patients: BloggerCourse.com  Fact Sheet for Healthcare Providers: SeriousBroker.it  This test is not yet approved or  cleared by the [place] FDA and has been authorized for detection and/or diagnosis of SARS-CoV-2 by FDA under an Emergency Use Authorization (EUA). This EUA will remain in effect (meaning this test can be used) for the duration of the COVID-19 declaration under Section 564(b)(1) of the Act, 21 U.S.C. section 360bbb-3(b)(1), unless the authorization is terminated or revoked.     Resp Syncytial Virus by PCR NEGATIVE NEGATIVE Final    Comment: (NOTE) Fact Sheet for Patients: BloggerCourse.com  Fact Sheet for Healthcare Providers: SeriousBroker.it  This test is not yet approved or cleared by the [place] FDA and has been authorized for detection and/or diagnosis of SARS-CoV-2 by FDA under an Emergency Use Authorization (EUA). This EUA will remain in effect (meaning this test can be used) for the duration of the COVID-19 declaration under Section 564(b)(1) of the Act, 21 U.S.C. section 360bbb-3(b)(1), unless the authorization is terminated or revoked.  Performed at [facility], [address], [place], [place] [address]   MRSA Next Gen by PCR, Nasal     Status: Abnormal   Collection Time: 10/24/23 12:40 PM   Specimen: Anterior Nasal Swab  Result Value Ref Range Status   MRSA by PCR Next Gen DETECTED (A) NOT DETECTED Final    Comment: RESULT CALLED TO, READ BACK BY AND VERIFIED WITH: R TEJEDA AT 1613 ON [id number] BY S DALTON (NOTE) The GeneXpert MRSA Assay (FDA approved for NASAL specimens only), is one component of a comprehensive MRSA colonization surveillance program. It is not intended to diagnose MRSA infection nor to guide or monitor treatment for MRSA infections. Test performance is not FDA approved in patients less than 19 years old. Performed at [facility], [address] [place], [place] [address]      Radiology Studies: No results found.   Scheduled Meds:  aspirin EC  81 mg Oral Q breakfast    atorvastatin  40 mg Oral QHS   ferrous gluconate  324 mg Oral Q breakfast   furosemide  80 mg Intravenous Q12H   gabapentin  400 mg Oral QHS   heparin  5,000 Units Subcutaneous Q8H   levothyroxine  75 mcg Oral QAC breakfast   loratadine  10 mg Oral Daily   lubiprostone  8 mcg Oral BID WC   melatonin  6 mg Oral QHS   mupirocin ointment  1 Application Nasal BID   pantoprazole  40 mg Oral Daily   polyethylene glycol  17 g Oral Daily   sodium chloride flush  3 mL Intravenous Q12H   tamsulosin  0.8 mg Oral QPM   Continuous Infusions:     LOS: 6 days    Time spent: 50 minutes    Vassie Loll, MD [facility]  If 7PM-7AM, please contact night-coverage [email or web address] 10/30/2023, 5:50 PM

## 2023-10-30 NOTE — Care Management Important Message (Signed)
Important Message  Patient Details  Name: Shane Chambers MRN: [id number] Date of Birth: 1961-02-11   Important Message Given:  Yes - Medicare IM     Corey Harold 10/30/2023, 11:39 AM

## 2023-10-31 DIAGNOSIS — E785 Hyperlipidemia, unspecified: Secondary | ICD-10-CM | POA: Diagnosis not present

## 2023-10-31 DIAGNOSIS — D649 Anemia, unspecified: Secondary | ICD-10-CM

## 2023-10-31 DIAGNOSIS — N1831 Chronic kidney disease, stage 3a: Secondary | ICD-10-CM | POA: Diagnosis not present

## 2023-10-31 DIAGNOSIS — J9621 Acute and chronic respiratory failure with hypoxia: Secondary | ICD-10-CM | POA: Diagnosis not present

## 2023-10-31 LAB — BASIC METABOLIC PANEL
Anion gap: 14 (ref 5–15)
BUN: 50 mg/dL — ABNORMAL HIGH (ref 6–20)
CO2: 36 mmol/L — ABNORMAL HIGH (ref 22–32)
Calcium: 8.7 mg/dL — ABNORMAL LOW (ref 8.9–10.3)
Chloride: 90 mmol/L — ABNORMAL LOW (ref 98–111)
Creatinine, Ser: 2.16 mg/dL — ABNORMAL HIGH (ref 0.61–1.24)
GFR, Estimated: 34 mL/min — ABNORMAL LOW (ref >60)
Glucose, Bld: 85 mg/dL (ref 70–99)
Potassium: 3.8 mmol/L (ref 3.5–5.1)
Sodium: 140 mmol/L (ref 135–145)

## 2023-10-31 MED ORDER — METOLAZONE 2.5 MG PO TABS: 2.5000 mg | ORAL_TABLET | ORAL | Status: AC

## 2023-10-31 MED ORDER — TRAZODONE HCL 50 MG PO TABS: 50.0000 mg | ORAL_TABLET | Freq: Every day | ORAL | 1 refills | Status: DC

## 2023-10-31 MED ORDER — HYDRALAZINE HCL 100 MG PO TABS: 100.0000 mg | ORAL_TABLET | Freq: Two times a day (BID) | ORAL | Status: DC

## 2023-10-31 MED ORDER — TORSEMIDE 20 MG PO TABS: 40.0000 mg | ORAL_TABLET | Freq: Two times a day (BID) | ORAL | Status: DC

## 2023-10-31 MED ORDER — HYDROXYZINE HCL 25 MG PO TABS: 25.0000 mg | ORAL_TABLET | Freq: Three times a day (TID) | ORAL | 0 refills | Status: DC | PRN

## 2023-10-31 NOTE — TOC Transition Note (Signed)
Transition of Care [facility]) - Discharge Note   Patient Details  Name: Shane Chambers MRN: [id number] Date of Birth: Dec 11, 1960  [facility] CM/SW Contact:  Catalina Gravel, LCSW Phone Number: 10/31/2023, 1:49 PM   Clinical Narrative:     Pt ready for DC. CSW contacted facility representative, can accept pt today.  Room visit to pt- advised that arranging transportation to facility. Pt stated he is missing his purse, agreed to share with nursing team. Room report info given to EMS,  Pt placed on [facility] list. No further TOC needs.    Barriers to Discharge: No Barriers Identified   Patient Goals and CMS Choice Patient states their goals for this hospitalization and ongoing recovery are:: return to LTC CMS Medicare.gov Compare Post Acute Care list provided to:: Patient Choice offered to / list presented to : Patient      Discharge Placement                       Discharge Plan and Services Additional resources added to the After Visit Summary for   In-house Referral: Clinical Social Work Discharge Planning Services: CM Consult Post Acute Care Choice: Skilled Nursing Facility                               Social Drivers of Health (SDOH) Interventions SDOH Screenings   Food Insecurity: No Food Insecurity (10/24/2023)  Housing: Low Risk  (10/24/2023)  Transportation Needs: No Transportation Needs (10/24/2023)  Utilities: Not At Risk (10/24/2023)  Alcohol Screen: Low Risk  (11/05/2022)  Tobacco Use: Medium Risk (10/24/2023)     Readmission Risk Interventions    10/25/2023   10:46 AM 02/19/2023    1:52 PM 02/15/2023   11:15 AM  Readmission Risk Prevention Plan  Transportation Screening Complete Complete Complete  Medication Review Oceanographer) Complete Complete Complete  PCP or Specialist appointment within 3-5 days of discharge  Complete   HRI or Home Care Consult Complete Complete Complete  SW Recovery Care/Counseling Consult Complete  Complete Complete  Palliative Care Screening Not Applicable Not Applicable Not Applicable  Skilled Nursing Facility Complete Complete Complete

## 2023-10-31 NOTE — Progress Notes (Signed)
Pt has removed telebox multiple times throughout night. Pt said he does not want to wear it because the stickers make him itch. Pt was provided with itch medication earlier in the shift but pt continued to remove box. Pt was educated on importance and reasoning behind monitoring but pt stated that no matter how many times were put the tele box on he was just going to remove again but he will not keep it on. MD was made aware and tele monitoring was placed on standby for now.

## 2023-10-31 NOTE — Discharge Summary (Signed)
Physician Discharge Summary   Patient: Shane Chambers MRN: [id number] DOB: 26-Aug-1961  Admit date:     10/24/2023  Discharge date: 10/31/23  Discharge Physician: Vassie Loll   PCP: [name], MD   Recommendations at discharge:  Repeat basic metabolic panel to follow electrolytes and renal function Reassess blood pressure and adjust antihypertensive treatment as needed Continue to follow patient's CBGs/A1c with further adjustment to hypoglycemic regimen as needed Continue assisting patient with weight loss management.  Discharge Diagnoses: Principal Problem:   Acute on chronic hypoxic respiratory failure (HCC) Active Problems:   Essential hypertension   COPD (chronic obstructive pulmonary disease) (HCC)   Type 2 diabetes mellitus without complications (HCC)   CKD stage 3a, GFR 45-59 ml/min (HCC)   BPH (benign prostatic hyperplasia)   Normocytic anemia   AKI (acute kidney injury) (HCC)   Morbid obesity (HCC)   Acquired hypothyroidism   Dyslipidemia   Brief admission narrative:   Shane Chambers is a 62 y.o. male with medical history significant for hypertension, type 2 diabetes, COPD on baseline 2 L nasal cannula, dyslipidemia, CAD, diastolic CHF, hypothyroidism, depression, BPH, CKD 3 AAA, right BKA, and rectal cancer in 2022 who presented to the ED with worsening shortness of breath from SNF.   Assessment and Plan: Acute on chronic hypoxemic respiratory failure secondary to acute diastolic CHF exacerbation -2D echocardiogram 06/2023 showed EF 60-65% -Repeat limited 2D echocardiogram 12/14 showing LVEF 55-60% with grade 3 diastolic dysfunction -wilI discharge on adjusted dose of oral diuretics regimen and weekly metolazone. -At discharge patient oxygen supplementation down to baseline using 2 L with good saturation appreciated. -Patient reports no orthopnea.   AKI on CKD stage IIIa -Continue currently improving from baseline with diuresis. -Likely cardiorenal secondary  to volume overload from above -Continue to monitor daily weights and fluid intake. -Continue to minimize nephrotoxic agents avoid the use of contrast and hypotension. -Creatinine down to 2.16. -Good urine output appreciated. -Will continue adjusted dose of diuretics at discharge.   Hypertension -Amlodipine has been discontinued at discharge; patient's hydralazine dose has been adjusted. -Continue adjusted dose of Demadex and weekly metolazone. -Vital signs stable at discharge -Heart healthy/low-sodium diet discussed with patient.   COPD -Currently without signs of acute exacerbation -Continue as needed bronchodilator management.   Dyslipidemia -Continue statin -Heart healthy diet has been ordered.   Hypothyroidism -Continue levothyroxine -Continue to follow thyroid panel intermittently.   Type 2 diabetes -Continue home hypoglycemic regimen and modify carbohydrate diet. -Follow CBG fluctuation and adjust hypoglycemic regimen as required.   Iron deficiency anemia -Continue iron supplementation -No signs of overt bleeding -Continue to follow hemoglobin trend.   BPH with chronic Foley catheter -Continue Flomax -No complaints of urinary retention currently. -No dysuria or suprapubic tenderness reported.   Morbid obesity -BMI 42.04 -Low-calorie diet and portion control discussed with patient.   Depression/suicidal ideation -Patient reports no suicidal ideation or hallucination; expressed not having any plans at the moment. -Flat affect and depressed mood appreciated on exam. -Patient has been cleared by psychiatry service -Started on as needed Atarax and Seroquel nightly. -Continue outpatient follow-up with psychiatry service.  Consultants: None Procedures performed: See below for x-ray reports. Disposition: Skilled nursing facility (long-term resident). Diet recommendation: Heart healthy/low-sodium and modified carbohydrate diet.  DISCHARGE MEDICATION: Allergies as  of 10/31/2023       Reactions   Ceftriaxone Anaphylaxis   Ace Inhibitors Swelling, Cough   (Not on MAR at [facility])   Chlorhexidine  Haloperidol And Related    Do NOT give anti-psychotics due to risk of  Torsades and QT prolongation (per Dr. Teena Irani request)   Other Itching   [name]   Shellfish Allergy    Listed on MAR   Penicillins Itching, Rash   Tolerated ancef (12-17-22)        Medication List     STOP taking these medications    amLODipine 10 MG tablet Commonly known as: NORVASC   Auvelity 45-105 MG Tbcr Generic drug: Dextromethorphan-buPROPion ER   doxycycline 100 MG tablet Commonly known as: VIBRA-TABS   furosemide 10 MG/ML injection Commonly known as: LASIX   LORazepam 0.5 MG tablet Commonly known as: ATIVAN   mirtazapine 45 MG tablet Commonly known as: REMERON       TAKE these medications    acetaminophen 500 MG tablet Commonly known as: TYLENOL Take 1,000 mg by mouth every 8 (eight) hours as needed for fever or moderate pain.   albuterol 108 (90 Base) MCG/ACT inhaler Commonly known as: VENTOLIN HFA Inhale 2 puffs into the lungs every 6 (six) hours as needed for wheezing or shortness of breath.   aspirin EC 81 MG tablet Take 1 tablet (81 mg total) by mouth daily with breakfast.   atorvastatin 40 MG tablet Commonly known as: LIPITOR Take 1 tablet (40 mg total) by mouth at bedtime.   bisacodyl 5 MG EC tablet Commonly known as: bisacodyl Take 1 tablet (5 mg total) by mouth daily as needed for moderate constipation.   budesonide-formoterol 160-4.5 MCG/ACT inhaler Commonly known as: Symbicort Inhale 2 puffs into the lungs in the morning and at bedtime.   carvedilol 25 MG tablet Commonly known as: COREG Take 1 tablet (25 mg total) by mouth 2 (two) times daily with a meal.   cetirizine 10 MG chewable tablet Commonly known as: ZYRTEC Chew 10 mg by mouth daily.   ferrous gluconate 324 MG  tablet Commonly known as: FERGON Take 324 mg by mouth daily.   gabapentin 300 MG capsule Commonly known as: NEURONTIN Take 300 mg by mouth 2 (two) times daily. What changed: Another medication with the same name was removed. Continue taking this medication, and follow the directions you see here.   hydrALAZINE 100 MG tablet Commonly known as: APRESOLINE Take 1 tablet (100 mg total) by mouth 2 (two) times daily. What changed: when to take this   hydrOXYzine 25 MG tablet Commonly known as: ATARAX Take 1 tablet (25 mg total) by mouth 3 (three) times daily as needed for anxiety.   ipratropium-albuterol 0.5-2.5 (3) MG/3ML Soln Commonly known as: DUONEB Take 3 mLs by nebulization every 6 (six) hours as needed. What changed: reasons to take this   levothyroxine 75 MCG tablet Commonly known as: SYNTHROID Take 75 mcg by mouth daily before breakfast.   lubiprostone 8 MCG capsule Commonly known as: Amitiza Take 1 capsule (8 mcg total) by mouth 2 (two) times daily with a meal.   Luliconazole 1 % Crea Apply 1 Application topically in the morning and at bedtime. To sacrum for moisture-related rash   metolazone 2.5 MG tablet Commonly known as: ZAROXOLYN Take 1 tablet (2.5 mg total) by mouth once a week. On Wednesdays What changed:  when to take this additional instructions   NovoLOG FlexPen 100 UNIT/ML FlexPen Generic drug: insulin aspart 0-15 Units, Subcutaneous, 3 times daily with meals CBG < 70: implement hypoglycemia protocol-call MD CBG 70 - 120: 0 units CBG 121 - 150: 2 units CBG 151 -  200: 3 units CBG 201 - 250: 5 units CBG 251 - 300: 8 units CBG 301 - 350: 11 units CBG 351 - 400: 15 units CBG > 400:   omeprazole 40 MG capsule Commonly known as: PRILOSEC Take 40 mg by mouth in the morning.   polyethylene glycol 17 g packet Commonly known as: MIRALAX / GLYCOLAX Take 17 g by mouth daily as needed for moderate constipation.   polyvinyl alcohol 1.4 % ophthalmic  solution Commonly known as: LIQUIFILM TEARS Place 1 drop into both eyes daily.   potassium chloride 10 MEQ tablet Commonly known as: KLOR-CON Take 10 mEq by mouth daily.   tamsulosin 0.4 MG Caps capsule Commonly known as: FLOMAX Take 0.8 mg by mouth every evening.   thiamine 100 MG tablet Commonly known as: Vitamin B-1 Take 1 tablet (100 mg total) by mouth daily.   torsemide 20 MG tablet Commonly known as: DEMADEX Take 2 tablets (40 mg total) by mouth 2 (two) times daily. What changed:  how much to take when to take this Another medication with the same name was removed. Continue taking this medication, and follow the directions you see here.   traZODone 50 MG tablet Commonly known as: DESYREL Take 1 tablet (50 mg total) by mouth at bedtime.   Vitamin D (Ergocalciferol) 1.25 MG (50000 UNIT) Caps capsule Commonly known as: DRISDOL Take 50,000 Units by mouth every 7 (seven) days. (Mondays)        Follow-up Information     [name], MD. Schedule an appointment as soon as possible for a visit in 10 day(s).   Specialty: Internal Medicine Contact information: [address] [place] [address] [phone]                Discharge Exam: Ceasar Mons Weights   10/29/23 0524 10/30/23 0500 10/31/23 0500  Weight: 101.9 kg 102 kg 101.6 kg   General exam: Alert, awake, oriented x 3; denying chest pain, orthopnea or any other complaints.  Feeling ready to be discharged. Respiratory system: Improved air movement bilaterally; no crackles, no wheezing.  Good saturation on chronic 2 L supplementation. Cardiovascular system:RRR. No rubs or gallops; positive murmur. Gastrointestinal system: Abdomen is obese, nondistended, soft and nontender. No organomegaly or masses felt. Normal bowel sounds heard. Central nervous system:No focal neurological deficits. Extremities: No cyanosis or clubbing; trace edema in his left lower extremity appreciated.  Right BKA. Skin: No  petechiae. Psychiatry: Judgement and insight appear normal.  Flat affect appreciated on exam.  Patient denies suicidal ideation or hallucination.   Condition at discharge: Stable and improved.  The results of significant diagnostics from this hospitalization (including imaging, microbiology, ancillary and laboratory) are listed below for reference.   Imaging Studies: ECHOCARDIOGRAM COMPLETE Result Date: 10/24/2023    ECHOCARDIOGRAM REPORT   Patient Name:   MONTERIUS UHL Date of Exam: 10/24/2023 Medical Rec #:  [id number]       Height:       70.0 in Accession #:    [id number]      Weight:       293.0 lb Date of Birth:  1961/07/01       BSA:          2.455 m Patient Age:    62 years        BP:           137/70 mmHg Patient Gender: M               HR:  62 bpm. [facility] Procedure: 2D Echo, Cardiac Doppler and Color Doppler Indications:    CHF  History:        Patient has prior history of Echocardiogram examinations, most                 recent 02/16/2023.  Sonographer:    Harriette Bouillon RDCS Referring Phys: [id number] PRATIK D [facility] IMPRESSIONS  1. Left ventricular ejection fraction, by estimation, is 55 to 60%. The left ventricle has normal function. The left ventricle has no regional wall motion abnormalities. The left ventricular internal cavity size was mildly to moderately dilated. There is mild left ventricular hypertrophy. Left ventricular diastolic parameters are consistent with Grade III diastolic dysfunction (restrictive). Elevated left atrial pressure. The E/e' is 26.  2. Right ventricular systolic function is mildly reduced. The right ventricular size is normal.  3. Left atrial size was severely dilated.  4. The mitral valve is normal in structure. Mild mitral valve regurgitation. No evidence of mitral stenosis.  5. The aortic valve has an indeterminant number of cusps. Aortic valve regurgitation is not visualized. Mild aortic valve stenosis.  6. The inferior vena cava is  dilated in size with >50% respiratory variability, suggesting right atrial pressure of 8 mmHg. Comparison(s): A prior study was performed on 02/16/2023. Grade 3 diastolic dysfunction and elevated filling pressures are new findings. Prior study noted moderate AS which is at best mild on current study. Otherwise see prior study for details. FINDINGS  Left Ventricle: Left ventricular ejection fraction, by estimation, is 55 to 60%. The left ventricle has normal function. The left ventricle has no regional wall motion abnormalities. The left ventricular internal cavity size was mildly to moderately dilated. There is mild left ventricular hypertrophy. Left ventricular diastolic parameters are consistent with Grade III diastolic dysfunction (restrictive). Elevated left atrial pressure. The E/e' is 90. Right Ventricle: The right ventricular size is normal. Right vetricular wall thickness was not well visualized. Right ventricular systolic function is mildly reduced. Left Atrium: Left atrial size was severely dilated. Right Atrium: Right atrial size was normal in size. Pericardium: There is no evidence of pericardial effusion. Mitral Valve: The mitral valve is normal in structure. Mild mitral valve regurgitation. No evidence of mitral valve stenosis. Tricuspid Valve: The tricuspid valve is normal in structure. Tricuspid valve regurgitation is trivial. No evidence of tricuspid stenosis. Aortic Valve: The aortic valve has an indeterminant number of cusps. Aortic valve regurgitation is not visualized. Mild aortic stenosis is present. Aortic valve mean gradient measures 11.0 mmHg. Aortic valve peak gradient measures 18.8 mmHg. Aortic valve  area, by VTI measures 1.82 cm. Pulmonic Valve: The pulmonic valve was normal in structure. Pulmonic valve regurgitation is trivial. No evidence of pulmonic stenosis. Aorta: The aortic root and ascending aorta are structurally normal, with no evidence of dilitation. Venous: The inferior vena  cava is dilated in size with greater than 50% respiratory variability, suggesting right atrial pressure of 8 mmHg. IAS/Shunts: The interatrial septum was not well visualized.  LEFT VENTRICLE PLAX 2D LVIDd:         6.30 cm   Diastology LVIDs:         3.90 cm   LV e' medial:    4.90 cm/s LV PW:         1.20 cm   LV E/e' medial:  29.8 LV IVS:        1.20 cm   LV e' lateral:   6.74 cm/s LVOT diam:  2.20 cm   LV E/e' lateral: 21.7 LV SV:         105 LV SV Index:   43 LVOT Area:     3.80 cm  RIGHT VENTRICLE            IVC RV S prime:     7.07 cm/s  IVC diam: 2.40 cm TAPSE (M-mode): 1.2 cm LEFT ATRIUM              Index LA diam:        4.50 cm  1.83 cm/m LA Vol (A2C):   116.0 ml 47.25 ml/m LA Vol (A4C):   124.0 ml 50.51 ml/m LA Biplane Vol: 125.0 ml 50.91 ml/m  AORTIC VALVE AV Area (Vmax):    1.77 cm AV Area (Vmean):   1.75 cm AV Area (VTI):     1.82 cm AV Vmax:           217.00 cm/s AV Vmean:          156.000 cm/s AV VTI:            0.574 m AV Peak Grad:      18.8 mmHg AV Mean Grad:      11.0 mmHg LVOT Vmax:         101.00 cm/s LVOT Vmean:        72.000 cm/s LVOT VTI:          0.275 m LVOT/AV VTI ratio: 0.48  AORTA Ao Root diam: 3.30 cm Ao Asc diam:  3.80 cm MITRAL VALVE MV Area (PHT): 3.72 cm     SHUNTS MV Decel Time: 204 msec     Systemic VTI:  0.28 m MV E velocity: 146.00 cm/s  Systemic Diam: 2.20 cm MV A velocity: 52.60 cm/s MV E/A ratio:  2.78 Sunit Tolia Electronically signed by Tessa Lerner Signature Date/Time: 10/24/2023/4:18:54 PM    Final    DG Chest Port 1 View Result Date: 10/24/2023 CLINICAL DATA:  sob EXAM: PORTABLE CHEST - 1 VIEW COMPARISON:  03/29/2023. FINDINGS: Cardiac silhouette is prominent. There is pulmonary interstitial prominence with vascular congestion. No focal consolidation. No pneumothorax. Small bilateral pleural effusions. There are median sternotomy wires. IMPRESSION: Findings suggest CHF. Electronically Signed   By: Layla Maw M.D.   On: 10/24/2023 10:44     Microbiology: Results for orders placed or performed during the hospital encounter of 10/24/23  Resp panel by RT-PCR (RSV, Flu A&B, Covid) Anterior Nasal Swab     Status: None   Collection Time: 10/24/23 10:11 AM   Specimen: Anterior Nasal Swab  Result Value Ref Range Status   SARS Coronavirus 2 by RT PCR NEGATIVE NEGATIVE Final    Comment: (NOTE) SARS-CoV-2 target nucleic acids are NOT DETECTED.  The SARS-CoV-2 RNA is generally detectable in upper respiratory specimens during the acute phase of infection. The lowest concentration of SARS-CoV-2 viral copies this assay can detect is 138 copies/mL. A negative result does not preclude SARS-Cov-2 infection and should not be used as the sole basis for treatment or other patient management decisions. A negative result may occur with  improper specimen collection/handling, submission of specimen other than nasopharyngeal swab, presence of viral mutation(s) within the areas targeted by this assay, and inadequate number of viral copies(<138 copies/mL). A negative result must be combined with clinical observations, patient history, and epidemiological information. The expected result is Negative.  Fact Sheet for Patients:  BloggerCourse.com  Fact Sheet for Healthcare Providers:  SeriousBroker.it  This test is no t  yet approved or cleared by the [place] and  has been authorized for detection and/or diagnosis of SARS-CoV-2 by FDA under an Emergency Use Authorization (EUA). This EUA will remain  in effect (meaning this test can be used) for the duration of the COVID-19 declaration under Section 564(b)(1) of the Act, 21 U.S.C.section 360bbb-3(b)(1), unless the authorization is terminated  or revoked sooner.       Influenza A by PCR NEGATIVE NEGATIVE Final   Influenza B by PCR NEGATIVE NEGATIVE Final    Comment: (NOTE) The Xpert Xpress SARS-CoV-2/FLU/RSV plus assay is  intended as an aid in the diagnosis of influenza from Nasopharyngeal swab specimens and should not be used as a sole basis for treatment. Nasal washings and aspirates are unacceptable for Xpert Xpress SARS-CoV-2/FLU/RSV testing.  Fact Sheet for Patients: BloggerCourse.com  Fact Sheet for Healthcare Providers: SeriousBroker.it  This test is not yet approved or cleared by the [place] FDA and has been authorized for detection and/or diagnosis of SARS-CoV-2 by FDA under an Emergency Use Authorization (EUA). This EUA will remain in effect (meaning this test can be used) for the duration of the COVID-19 declaration under Section 564(b)(1) of the Act, 21 U.S.C. section 360bbb-3(b)(1), unless the authorization is terminated or revoked.     Resp Syncytial Virus by PCR NEGATIVE NEGATIVE Final    Comment: (NOTE) Fact Sheet for Patients: BloggerCourse.com  Fact Sheet for Healthcare Providers: SeriousBroker.it  This test is not yet approved or cleared by the [place] FDA and has been authorized for detection and/or diagnosis of SARS-CoV-2 by FDA under an Emergency Use Authorization (EUA). This EUA will remain in effect (meaning this test can be used) for the duration of the COVID-19 declaration under Section 564(b)(1) of the Act, 21 U.S.C. section 360bbb-3(b)(1), unless the authorization is terminated or revoked.  Performed at [facility], [address] [place], [place] [address]   MRSA Next Gen by PCR, Nasal     Status: Abnormal   Collection Time: 10/24/23 12:40 PM   Specimen: Anterior Nasal Swab  Result Value Ref Range Status   MRSA by PCR Next Gen DETECTED (A) NOT DETECTED Final    Comment: RESULT CALLED TO, READ BACK BY AND VERIFIED WITH: R TEJEDA AT 1613 ON [id number] BY S DALTON (NOTE) The GeneXpert MRSA Assay (FDA approved for NASAL specimens only), is one component  of a comprehensive MRSA colonization surveillance program. It is not intended to diagnose MRSA infection nor to guide or monitor treatment for MRSA infections. Test performance is not FDA approved in patients less than 67 years old. Performed at [facility], [address] [place], [place] [address]     Labs: CBC: Recent Labs  Lab 10/26/23 0448  WBC 10.7*  HGB 8.0*  HCT 25.7*  MCV 100.0  PLT 231   Basic Metabolic Panel: Recent Labs  Lab 10/26/23 0448 10/27/23 0415 10/28/23 0439 10/29/23 0417 10/31/23 0654  NA 138 141 140 141 140  K 4.6 4.0 3.7 3.9 3.8  CL 100 99 97* 94* 90*  CO2 25 33* 33* 35* 36*  GLUCOSE 104* 138* 108* 88 85  BUN 69* 64* 61* 55* 50*  CREATININE 3.19* 2.44* 2.21* 2.10* 2.16*  CALCIUM 8.6* 8.9 8.8* 8.8* 8.7*  MG 1.5*  --   --   --   --     CBG: Recent Labs  Lab 10/26/23 0610 10/30/23 0810  GLUCAP 116* 93    Discharge time spent: greater than 30 minutes.  Signed: Vassie Loll, MD [facility] 10/31/2023

## 2023-11-18 LAB — LAB REPORT - SCANNED: EGFR: 24

## 2023-12-03 ENCOUNTER — Encounter (HOSPITAL_COMMUNITY): Payer: Self-pay | Admitting: Hematology

## 2023-12-08 ENCOUNTER — Other Ambulatory Visit: Payer: Self-pay | Admitting: Nephrology

## 2023-12-08 ENCOUNTER — Telehealth: Payer: Self-pay

## 2023-12-08 DIAGNOSIS — D631 Anemia in chronic kidney disease: Secondary | ICD-10-CM | POA: Insufficient documentation

## 2023-12-08 NOTE — Telephone Encounter (Signed)
Auth Submission: NO AUTH NEEDED Site of care: [facility] Payer: medicare a/b Medication & CPT/J Code(s) submitted: Feraheme (ferumoxytol) [id number] Route of submission (phone, fax, portal): portal Phone # Fax # Auth type: Buy/Bill HB Units/visits requested: 510mg , 2 doses, weekly Reference number:  Approval from: 12/08/23 to 02/08/24

## 2023-12-09 NOTE — Progress Notes (Signed)
[facility] 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 - Pathology of the sigmoid polypectomy shows invasive colonic adenocarcinoma involving a tubular adenoma.  Carcinoma invades to a depth of 2 mm.  Carcinoma is 1 mm from the margin.  No lymphovascular invasion.  No poorly differentiated component. - Flexible sigmoidoscopy on 08/26/2021 with 15 mm ulcer from previous polypectomy found in the rectum.  This is in the rectum approximately 5 to 8 cm from the anal verge. - CT chest and abdomen with contrast from 09/12/2021 with no sign of metastatic disease in the chest.  Top normal size of left parotic lymph node measuring 10 to 12 mm but with fatty hila, nonspecific. - MRI of the pelvis without contrast from 09/12/2021 was suboptimal due to rectal spasm and gross patient motion which limits assessment.  Site of biopsy not visible and not well assessed.  No gross extracolonic soft tissue.  Potential left meso rectal lymph node within 1 to 2 mm of mesorectal fascia.  - CT pelvis in December 2022 showed scattered pelvic lymph nodes and left para-aortic lymph node. - He was evaluated by Dr. Maisie Fus.  Pathologist reviewed his slides and felt like his cancer was superficial. - He underwent sigmoidoscopy by Dr. Marletta Lor and biopsy at the previous resection site which showed benign colonic  mucosa with benign lymphoid aggregate.  No adenomatous change or malignancy.   Social/family history: - He is a retired Engineer, civil (consulting).  He works for [employer]. - He quit smoking in 1996. - He is currently residing at [facility] in [place] since January 2022.  He was originally admitted there for rehab after cellulitis.  He reports that he will soon be discharged to a community apartment. - Maternal great aunt had breast cancer.  No other malignancies.  Plan: Rectal adenocarcinoma in a polyp, MSI-stable, MMR preserved: - Last colonoscopy on 06/29/2023: Nonbleeding internal hemorrhoids.  Post polypectomy scar in the rectum biopsied.  7 mm polyp in the cecum removed. - Cecal polyp was tubular adenoma.  Rectal mucosa with focal changes consistent with previous biopsy site with no aggressive findings. - Reviewed CT scan of the abdomen and pelvis from 07/30/2023: No evidence of recurrence. - Will send a CEA level today.  LFTs today are normal. - RTC 6 months for follow-up with repeat labs.   2.  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 - CBC today shows hemoglobin 10.5 with ferritin 279 and percent saturation 34.  B12 and folic acid were normal.  No indication for parenteral iron therapy.  Orders Placed This Encounter  Procedures   Iron and TIBC ([facility] ONLY)    Standing Status:   Future    Number of Occurrences:   1    Expected Date:   12/10/2023    Expiration Date:   12/09/2024   Ferritin    Standing  Status:   Future    Number of Occurrences:   1    Expected Date:   12/10/2023    Expiration Date:   12/09/2024   Vitamin B12    Standing Status:   Future    Number of Occurrences:   1    Expected Date:   12/10/2023    Expiration Date:   12/09/2024   Folate    Standing Status:   Future    Number of Occurrences:   1    Expected Date:   12/10/2023    Expiration Date:   12/09/2024   CBC with Differential     Standing Status:   Future    Number of Occurrences:   1    Expected Date:   12/10/2023    Expiration Date:   12/09/2024   CEA    Standing Status:   Future    Number of Occurrences:   1    Expected Date:   12/10/2023    Expiration Date:   12/09/2024   Hepatic function panel    Standing Status:   Future    Number of Occurrences:   1    Expected Date:   12/10/2023    Expiration Date:   12/09/2024      Mikeal Hawthorne R Teague,acting as a scribe for Doreatha Massed, MD.,have documented all relevant documentation on the behalf of Doreatha Massed, MD,as directed by  Doreatha Massed, MD while in the presence of Doreatha Massed, MD.  I, Doreatha Massed MD, have reviewed the above documentation for accuracy and completeness, and I agree with the above.    Doreatha Massed, MD   1/30/20255:24 PM  CHIEF COMPLAINT:   Diagnosis: rectal cancer   Cancer Staging  Rectal cancer [facility]) Staging form: Colon and Rectum, AJCC 8th Edition - Clinical stage from 09/03/2021: Stage 0 (cTis, cN0, cM0) - Unsigned    Prior Therapy: none  Current Therapy:  surveillance   HISTORY OF PRESENT ILLNESS:   Oncology History   No history exists.     INTERVAL HISTORY:   Armari is a 63 y.o. male presenting to clinic today for follow up of rectal adenocarcinoma. He was last seen by me on 06/04/23.  He had a colonoscopy on 06/29/23 with Dr. Marletta Lor. He was admitted to the hospital from 07/30/23 to 08/03/23 for stercoral colitis. He was admitted to the hospital again from 10/24/23 to 10/31/23 for acute on chronic hypoxic respiratory failure secondary to acute diastolic CHF exacerbation. His dosage of oral diuretics was changed and he was prescribed weekly metolazone.   Today, he states that he is doing well overall. His appetite level is at 40-50%. His energy level is at 50%.  He reports alternating constipation and diarrhea. Constipation was present before surgery. He was originally prescribed  Linzess, which caused severe diarrhea. He was then switched to Amitiza which only slightly improved diarrhea. He is not taking either medication at this time. He has diarrhea more often than constipation and states he will have diarrhea for several days with 1-2 days of no BM's after. He notes he ate a salad night that caused nocturia. He denies any BRBPR since December 2024.   PAST MEDICAL HISTORY:   Past Medical History: Past Medical History:  Diagnosis Date   Anemia    Arthritis    CAD (coronary artery disease)    a. s/p CABG in 03/2020 with LIMA-LAD, RIMA-PL, RA-D1-RI-OM1   Cancer (HCC)    rectal   Cellulitis  COPD (chronic obstructive pulmonary disease) (HCC)    Depression    Diabetes mellitus without complication (HCC)    GERD (gastroesophageal reflux disease)    History of kidney stones    Hyperlipidemia 12/01/2019   Hypertension    Hypothyroidism    Ischemic cardiomyopathy    a. EF 20-25% by echo in 02/2020 b. at 40% by echo in 03/2020 c. EF normalized to 60-65% by echo in 04/2020   Myocardial infarction [facility])    Peripheral vascular disease (HCC)    Sleep apnea    Type 2 diabetes mellitus [facility])     Surgical History: Past Surgical History:  Procedure Laterality Date   AMPUTATION Right 10/05/2022   Procedure: AMPUTATION BELOW KNEE;  Surgeon: Nadara Mustard, MD;  Location: [facility] OR;  Service: Orthopedics;  Laterality: Right;   AMPUTATION Right 11/14/2022   Procedure: AMPUTATION BELOW KNEE REVISION; WASHOUT;  Surgeon: Renford Dills, MD;  Location: [facility] ORS;  Service: Vascular;  Laterality: Right;   AMPUTATION Right 11/18/2022   Procedure: AMPUTATION BELOW KNEE REVISION AND CLOSURE;  Surgeon: Renford Dills, MD;  Location: [facility] ORS;  Service: Vascular;  Laterality: Right;   APPLICATION OF WOUND VAC Right 10/05/2022   Procedure: APPLICATION OF WOUND VAC;  Surgeon: Nadara Mustard, MD;  Location: [facility] OR;  Service: Orthopedics;  Laterality: Right;   BIOPSY  07/18/2021    Procedure: BIOPSY;  Surgeon: Lanelle Bal, DO;  [facility];  Service: Endoscopy;;   BIOPSY  01/09/2022   Procedure: BIOPSY;  Surgeon: Lanelle Bal, DO;  [facility];  Service: Endoscopy;;   BIOPSY  06/29/2023   Procedure: BIOPSY;  Surgeon: Lanelle Bal, DO;  [facility];  Service: Endoscopy;;   COLONOSCOPY WITH PROPOFOL N/A 07/18/2021   Carver: 15 millimeter polyp removed from the sigmoid colon, 5 mm polyp removed from sigmoid colon.  Nonbleeding internal hemorrhoids.  Significant looping of the colon. sigmoid path showed invasive colonic adenocarcinoma involving tubular adenoma (invades to depth of 2mm, carcinoma 1mm from margin, no lymphovascular invasion, no poorly differentiated component.   COLONOSCOPY WITH PROPOFOL N/A 06/29/2023   Procedure: COLONOSCOPY WITH PROPOFOL;  Surgeon: Lanelle Bal, DO;  [facility];  Service: Endoscopy;  Laterality: N/A;  7:30 am, asa 3   CORONARY ARTERY BYPASS GRAFT N/A 03/13/2020   Procedure: CORONARY ARTERY BYPASS GRAFTING (CABG) times five using bilateral Internal mammary arteries and left radial artery;  Surgeon: Linden Dolin, MD;  Location: [facility] OR;  Service: Open Heart Surgery;  Laterality: N/A;   DENTAL SURGERY     ESOPHAGOGASTRODUODENOSCOPY (EGD) WITH PROPOFOL N/A 07/18/2021   Carver: 1 gastric polyp status post biopsy, gastritis. gastric bx with slight chronic inflammation and no H.pyori. GEJ polypectomy with mild inflammation only   FLEXIBLE SIGMOIDOSCOPY N/A 08/26/2021   Carver: Nonbleeding internal hemorrhoids.  15 mm ulcers from previous polypectomy found in the rectum.  No evidence of previous polyp.  Located 5 to 8 cm from anal verge.   FLEXIBLE SIGMOIDOSCOPY N/A 01/09/2022   Procedure: FLEXIBLE SIGMOIDOSCOPY;  Surgeon: Lanelle Bal, DO;  [facility];  Service: Endoscopy;  Laterality: N/A;   POLYPECTOMY  07/18/2021   Procedure: POLYPECTOMY INTESTINAL;  Surgeon:  Lanelle Bal, DO;  [facility];  Service: Endoscopy;;   POLYPECTOMY  06/29/2023   Procedure: POLYPECTOMY INTESTINAL;  Surgeon: Lanelle Bal, DO;  [facility];  Service: Endoscopy;;   RADIAL ARTERY HARVEST Left  03/13/2020   Procedure: RADIAL ARTERY HARVEST,;  Surgeon: Linden Dolin, MD;  Location: [facility] OR;  Service: Open Heart Surgery;  Laterality: Left;   RIGHT/LEFT HEART CATH AND CORONARY ANGIOGRAPHY N/A 03/07/2020   Procedure: RIGHT/LEFT HEART CATH AND CORONARY ANGIOGRAPHY;  Surgeon: Tonny Bollman, MD;  Location: [facility] INVASIVE CV LAB;  Service: Cardiovascular;  Laterality: N/A;   STUMP REVISION Right 12/17/2022   Procedure: RIGHT BELOW KNEE AMPUTATION REVISION;  Surgeon: Nadara Mustard, MD;  Location: [facility] OR;  Service: Orthopedics;  Laterality: Right;   SUBMUCOSAL LIFTING INJECTION  01/09/2022   Procedure: SUBMUCOSAL LIFTING INJECTION;  Surgeon: Lanelle Bal, DO;  [facility];  Service: Endoscopy;;   SUBMUCOSAL TATTOO INJECTION  01/09/2022   Procedure: SUBMUCOSAL TATTOO INJECTION;  Surgeon: Lanelle Bal, DO;  [facility];  Service: Endoscopy;;   TEE WITHOUT CARDIOVERSION N/A 03/13/2020   Procedure: TRANSESOPHAGEAL ECHOCARDIOGRAM (TEE);  Surgeon: Linden Dolin, MD;  Location: [facility] OR;  Service: Open Heart Surgery;  Laterality: N/A;    Social History: Social History   Socioeconomic History   Marital status: Single    Spouse name: Not on file   Number of children: Not on file   Years of education: Not on file   Highest education level: Not on file  Occupational History   Not on file  Tobacco Use   Smoking status: Former    Current packs/day: 0.00    Types: Cigarettes    Quit date: 05/25/1995    Years since quitting: 28.5   Smokeless tobacco: Never  Vaping Use   Vaping status: Never Used  Substance and Sexual Activity   Alcohol use: Not Currently    Comment: rarely   Drug use: No   Sexual activity: Not Currently   Other Topics Concern   Not on file  Social History Narrative   Not on file   Social Drivers of Health   Financial Resource Strain: Not on file  Food Insecurity: No Food Insecurity (10/24/2023)   Hunger Vital Sign    Worried About Running Out of Food in the Last Year: Never true    Ran Out of Food in the Last Year: Never true  Transportation Needs: No Transportation Needs (10/24/2023)   PRAPARE - Administrator, Civil Service (Medical): No    Lack of Transportation (Non-Medical): No  Physical Activity: Not on file  Stress: Not on file  Social Connections: Not on file  Intimate Partner Violence: Not At Risk (10/24/2023)   Humiliation, Afraid, Rape, and Kick questionnaire    Fear of Current or Ex-Partner: No    Emotionally Abused: No    Physically Abused: No    Sexually Abused: No    Family History: Family History  Problem Relation Age of Onset   Hypertension Mother     Current Medications:  Current Outpatient Medications:    acetaminophen (TYLENOL) 500 MG tablet, Take 1,000 mg by mouth every 8 (eight) hours as needed for fever or moderate pain., Disp: , Rfl:    albuterol (VENTOLIN HFA) 108 (90 Base) MCG/ACT inhaler, Inhale 2 puffs into the lungs every 6 (six) hours as needed for wheezing or shortness of breath., Disp: , Rfl:    aspirin 81 MG EC tablet, Take 1 tablet (81 mg total) by mouth daily with breakfast., Disp: 30 tablet, Rfl: 12   atorvastatin (LIPITOR) 40 MG tablet, Take 1 tablet (40 mg total) by mouth at bedtime., Disp: , Rfl:    bisacodyl  5 MG EC tablet, Take 1 tablet (5 mg total) by mouth daily as needed for moderate constipation., Disp: 30 tablet, Rfl: 0   budesonide-formoterol (SYMBICORT) 160-4.5 MCG/ACT inhaler, Inhale 2 puffs into the lungs in the morning and at bedtime., Disp: 1 Inhaler, Rfl: 6   carvedilol (COREG) 25 MG tablet, Take 1 tablet (25 mg total) by mouth 2 (two) times daily with a meal., Disp: , Rfl:    cetirizine (ZYRTEC) 10 MG  chewable tablet, Chew 10 mg by mouth daily., Disp: , Rfl:    ferrous gluconate (FERGON) 324 MG tablet, Take 324 mg by mouth daily., Disp: , Rfl:    gabapentin (NEURONTIN) 300 MG capsule, Take 300 mg by mouth 2 (two) times daily., Disp: , Rfl:    hydrALAZINE (APRESOLINE) 100 MG tablet, Take 1 tablet (100 mg total) by mouth 2 (two) times daily., Disp: , Rfl:    hydrOXYzine (ATARAX) 25 MG tablet, Take 1 tablet (25 mg total) by mouth 3 (three) times daily as needed for anxiety., Disp: 30 tablet, Rfl: 0   insulin aspart (NOVOLOG FLEXPEN) 100 UNIT/ML FlexPen, 0-15 Units, Subcutaneous, 3 times daily with meals CBG < 70: implement hypoglycemia protocol-call MD CBG 70 - 120: 0 units CBG 121 - 150: 2 units CBG 151 - 200: 3 units CBG 201 - 250: 5 units CBG 251 - 300: 8 units CBG 301 - 350: 11 units CBG 351 - 400: 15 units CBG > 400:, Disp: 15 mL, Rfl: 0   ipratropium-albuterol (DUONEB) 0.5-2.5 (3) MG/3ML SOLN, Take 3 mLs by nebulization every 6 (six) hours as needed. (Patient taking differently: Take 3 mLs by nebulization every 6 (six) hours as needed (SOB/wheezing).), Disp: 360 mL, Rfl:    levothyroxine (SYNTHROID) 75 MCG tablet, Take 75 mcg by mouth daily before breakfast., Disp: , Rfl:    lubiprostone (AMITIZA) 8 MCG capsule, Take 1 capsule (8 mcg total) by mouth 2 (two) times daily with a meal., Disp: 60 capsule, Rfl: 5   Luliconazole 1 % CREA, Apply 1 Application topically in the morning and at bedtime. To sacrum for moisture-related rash, Disp: , Rfl:    metolazone (ZAROXOLYN) 2.5 MG tablet, Take 1 tablet (2.5 mg total) by mouth once a week. On Wednesdays, Disp: , Rfl:    omeprazole (PRILOSEC) 40 MG capsule, Take 40 mg by mouth in the morning., Disp: , Rfl:    polyethylene glycol (MIRALAX / GLYCOLAX) 17 g packet, Take 17 g by mouth daily as needed for moderate constipation., Disp: , Rfl:    polyvinyl alcohol (LIQUIFILM TEARS) 1.4 % ophthalmic solution, Place 1 drop into both eyes daily., Disp: , Rfl:     potassium chloride (KLOR-CON) 10 MEQ tablet, Take 10 mEq by mouth daily., Disp: , Rfl:    tamsulosin (FLOMAX) 0.4 MG CAPS capsule, Take 0.8 mg by mouth every evening., Disp: , Rfl:    thiamine (VITAMIN B-1) 100 MG tablet, Take 1 tablet (100 mg total) by mouth daily., Disp: , Rfl:    torsemide (DEMADEX) 20 MG tablet, Take 2 tablets (40 mg total) by mouth 2 (two) times daily., Disp: , Rfl:    traZODone (DESYREL) 50 MG tablet, Take 1 tablet (50 mg total) by mouth at bedtime., Disp: 30 tablet, Rfl: 1   Vitamin D, Ergocalciferol, (DRISDOL) 1.25 MG (50000 UNIT) CAPS capsule, Take 50,000 Units by mouth every 7 (seven) days. (Mondays), Disp: , Rfl:    Allergies: Allergies  Allergen Reactions   Ceftriaxone Anaphylaxis   Ace Inhibitors Swelling  and Cough    (Not on MAR at [name])   Chlorhexidine    Haloperidol And Related     Do NOT give anti-psychotics due to risk of  Torsades and QT prolongation (per Dr. Teena Irani request)   Other Itching    [name]   Shellfish Allergy     Listed on MAR   Penicillins Itching and Rash    Tolerated ancef (12-17-22)    REVIEW OF SYSTEMS:   Review of Systems  Constitutional:  Negative for chills, fatigue and fever.  HENT:   Positive for trouble swallowing. Negative for lump/mass, mouth sores, nosebleeds and sore throat.   Eyes:  Negative for eye problems.  Respiratory:  Positive for cough and shortness of breath (with exertion).   Cardiovascular:  Positive for palpitations. Negative for chest pain and leg swelling.  Gastrointestinal:  Positive for constipation, diarrhea and nausea. Negative for abdominal pain and vomiting.  Genitourinary:  Negative for bladder incontinence, difficulty urinating, dysuria, frequency, hematuria and nocturia.   Musculoskeletal:  Negative for arthralgias, back pain, flank pain, myalgias and neck pain.  Skin:  Negative for itching and rash.  Neurological:  Positive for dizziness and headaches.  Negative for numbness.  Hematological:  Does not bruise/bleed easily.  Psychiatric/Behavioral:  Positive for sleep disturbance. Negative for depression and suicidal ideas. The patient is not nervous/anxious.   All other systems reviewed and are negative.    VITALS:   Blood pressure 118/66, pulse (!) 58, temperature 98.4 F (36.9 C), temperature source Oral, resp. rate 16, SpO2 100%.  Wt Readings from Last 3 Encounters:  10/31/23 224 lb (101.6 kg)  09/29/23 230 lb (104.3 kg)  08/03/23 247 lb 12.8 oz (112.4 kg)    There is no height or weight on file to calculate BMI.  Performance status (ECOG): 2 - Symptomatic, <50% confined to bed  PHYSICAL EXAM:   Physical Exam Vitals and nursing note reviewed. Exam conducted with a chaperone present.  Constitutional:      Appearance: Normal appearance.  Cardiovascular:     Rate and Rhythm: Normal rate and regular rhythm.     Pulses: Normal pulses.     Heart sounds: Normal heart sounds.  Pulmonary:     Effort: Pulmonary effort is normal.     Breath sounds: Normal breath sounds.  Abdominal:     Palpations: Abdomen is soft. There is no hepatomegaly, splenomegaly or mass.     Tenderness: There is abdominal tenderness (from coughing) in the left lower quadrant.  Musculoskeletal:     Right lower leg: No edema.     Left lower leg: No edema.  Lymphadenopathy:     Cervical: No cervical adenopathy.     Right cervical: No superficial, deep or posterior cervical adenopathy.    Left cervical: No superficial, deep or posterior cervical adenopathy.     Upper Body:     Right upper body: No supraclavicular or axillary adenopathy.     Left upper body: No supraclavicular or axillary adenopathy.  Neurological:     General: No focal deficit present.     Mental Status: He is alert and oriented to person, place, and time.  Psychiatric:        Mood and Affect: Mood normal.        Behavior: Behavior normal.     LABS:      Latest Ref Rng & Units  12/10/2023   12:14 PM 10/26/2023    4:48 AM 10/24/2023  10:07 AM  CBC  WBC 4.0 - 10.5 K/uL 8.6  10.7  11.6   Hemoglobin 13.0 - 17.0 g/dL 16.1  8.0  8.4   Hematocrit 39.0 - 52.0 % 32.9  25.7  28.3   Platelets 150 - 400 K/uL 216  231  246       Latest Ref Rng & Units 12/10/2023   12:14 PM 10/31/2023    6:54 AM 10/29/2023    4:17 AM  CMP  Glucose 70 - 99 mg/dL  85  88   BUN 6 - 20 mg/dL  50  55   Creatinine 0.96 - 1.24 mg/dL  0.45  4.09   Sodium 811 - 145 mmol/L  140  141   Potassium 3.5 - 5.1 mmol/L  3.8  3.9   Chloride 98 - 111 mmol/L  90  94   CO2 22 - 32 mmol/L  36  35   Calcium 8.9 - 10.3 mg/dL  8.7  8.8   Total Protein 6.5 - 8.1 g/dL 7.1     Total Bilirubin 0.0 - 1.2 mg/dL 0.7     Alkaline Phos 38 - 126 U/L 86     AST 15 - 41 U/L 17     ALT 0 - 44 U/L 14        Lab Results  Component Value Date   CEA1 1.8 09/17/2022   /  CEA  Date Value Ref Range Status  09/17/2022 1.8 0.0 - 4.7 ng/mL Final    Comment:    (NOTE)                             Nonsmokers          <3.9                             Smokers             <5.6 [employer] Electrochemiluminescence Immunoassay (ECLIA) Values obtained with different assay methods or kits cannot be used interchangeably.  Results cannot be interpreted as absolute evidence of the presence or absence of malignant disease. Performed At: [facility] [address] [place], [place] [id number] Jolene Schimke MD OZ:[id number]    No results found for: "PSA1" No results found for: [phone]" No results found for: "CAN125"  No results found for: "TOTALPROTELP", "ALBUMINELP", "A1GS", "A2GS", "BETS", "BETA2SER", "GAMS", "MSPIKE", "SPEI" Lab Results  Component Value Date   TIBC 281 12/10/2023   TIBC 218 (L) 02/16/2023   TIBC 153 (L) 09/29/2022   FERRITIN 279 12/10/2023   FERRITIN 282 02/16/2023   FERRITIN 913 (H) 09/29/2022   IRONPCTSAT 34 12/10/2023   IRONPCTSAT 7 (L) 02/16/2023   IRONPCTSAT 7 (L) 09/29/2022    Lab Results  Component Value Date   LDH 266 (H) 12/24/2019   LDH 242 (H) 12/01/2019     STUDIES:   No results found.

## 2023-12-10 ENCOUNTER — Inpatient Hospital Stay: Payer: Medicare Other

## 2023-12-10 ENCOUNTER — Inpatient Hospital Stay: Payer: Medicare Other | Attending: Hematology | Admitting: Hematology

## 2023-12-10 VITALS — BP 118/66 | HR 58 | Temp 98.4°F | Resp 16

## 2023-12-10 DIAGNOSIS — D509 Iron deficiency anemia, unspecified: Secondary | ICD-10-CM

## 2023-12-10 DIAGNOSIS — Z87891 Personal history of nicotine dependence: Secondary | ICD-10-CM | POA: Insufficient documentation

## 2023-12-10 DIAGNOSIS — C20 Malignant neoplasm of rectum: Secondary | ICD-10-CM | POA: Insufficient documentation

## 2023-12-10 LAB — CBC WITH DIFFERENTIAL/PLATELET
Abs Immature Granulocytes: 0.08 10*3/uL — ABNORMAL HIGH (ref 0.00–0.07)
Basophils Absolute: 0 10*3/uL (ref 0.0–0.1)
Basophils Relative: 1 %
Eosinophils Absolute: 0.6 10*3/uL — ABNORMAL HIGH (ref 0.0–0.5)
Eosinophils Relative: 7 %
HCT: 32.9 % — ABNORMAL LOW (ref 39.0–52.0)
Hemoglobin: 10.5 g/dL — ABNORMAL LOW (ref 13.0–17.0)
Immature Granulocytes: 1 %
Lymphocytes Relative: 20 %
Lymphs Abs: 1.7 10*3/uL (ref 0.7–4.0)
MCH: 29.3 pg (ref 26.0–34.0)
MCHC: 31.9 g/dL (ref 30.0–36.0)
MCV: 91.9 fL (ref 80.0–100.0)
Monocytes Absolute: 0.7 10*3/uL (ref 0.1–1.0)
Monocytes Relative: 8 %
Neutro Abs: 5.5 10*3/uL (ref 1.7–7.7)
Neutrophils Relative %: 63 %
Platelets: 216 10*3/uL (ref 150–400)
RBC: 3.58 MIL/uL — ABNORMAL LOW (ref 4.22–5.81)
RDW: 12.4 % (ref 11.5–15.5)
WBC: 8.6 10*3/uL (ref 4.0–10.5)
nRBC: 0 % (ref 0.0–0.2)

## 2023-12-10 LAB — VITAMIN B12: Vitamin B-12: 496 pg/mL (ref 180–914)

## 2023-12-10 LAB — FOLATE: Folate: 7.5 ng/mL (ref >5.9)

## 2023-12-10 LAB — HEPATIC FUNCTION PANEL
ALT: 14 U/L (ref 0–44)
AST: 17 U/L (ref 15–41)
Albumin: 3.5 g/dL (ref 3.5–5.0)
Alkaline Phosphatase: 86 U/L (ref 38–126)
Bilirubin, Direct: 0.1 mg/dL (ref 0.0–0.2)
Indirect Bilirubin: 0.6 mg/dL (ref 0.3–0.9)
Total Bilirubin: 0.7 mg/dL (ref 0.0–1.2)
Total Protein: 7.1 g/dL (ref 6.5–8.1)

## 2023-12-10 LAB — IRON AND TIBC
Iron: 94 ug/dL (ref 45–182)
Saturation Ratios: 34 % (ref 17.9–39.5)
TIBC: 281 ug/dL (ref 250–450)
UIBC: 187 ug/dL

## 2023-12-10 LAB — FERRITIN: Ferritin: 279 ng/mL (ref 24–336)

## 2023-12-11 LAB — CEA: CEA: 2.8 ng/mL (ref 0.0–4.7)

## 2023-12-15 ENCOUNTER — Emergency Department
Admission: EM | Admit: 2023-12-15 | Discharge: 2023-12-16 | Disposition: A | Discharge status: Home / Self Care | Payer: Medicare Other | Attending: Emergency Medicine | Admitting: Emergency Medicine

## 2023-12-15 ENCOUNTER — Other Ambulatory Visit: Payer: Self-pay

## 2023-12-15 ENCOUNTER — Encounter: Payer: Self-pay | Admitting: Emergency Medicine

## 2023-12-15 DIAGNOSIS — R4689 Other symptoms and signs involving appearance and behavior: Secondary | ICD-10-CM

## 2023-12-15 DIAGNOSIS — F419 Anxiety disorder, unspecified: Secondary | ICD-10-CM

## 2023-12-15 DIAGNOSIS — J449 Chronic obstructive pulmonary disease, unspecified: Secondary | ICD-10-CM | POA: Diagnosis not present

## 2023-12-15 DIAGNOSIS — I2510 Atherosclerotic heart disease of native coronary artery without angina pectoris: Secondary | ICD-10-CM | POA: Diagnosis not present

## 2023-12-15 DIAGNOSIS — I509 Heart failure, unspecified: Secondary | ICD-10-CM | POA: Diagnosis not present

## 2023-12-15 DIAGNOSIS — Z955 Presence of coronary angioplasty implant and graft: Secondary | ICD-10-CM | POA: Insufficient documentation

## 2023-12-15 DIAGNOSIS — F32A Depression, unspecified: Secondary | ICD-10-CM | POA: Diagnosis not present

## 2023-12-15 DIAGNOSIS — Z79899 Other long term (current) drug therapy: Secondary | ICD-10-CM | POA: Diagnosis not present

## 2023-12-15 DIAGNOSIS — F29 Unspecified psychosis not due to a substance or known physiological condition: Secondary | ICD-10-CM | POA: Diagnosis not present

## 2023-12-15 DIAGNOSIS — N189 Chronic kidney disease, unspecified: Secondary | ICD-10-CM | POA: Diagnosis not present

## 2023-12-15 DIAGNOSIS — F69 Unspecified disorder of adult personality and behavior: Secondary | ICD-10-CM | POA: Insufficient documentation

## 2023-12-15 DIAGNOSIS — E119 Type 2 diabetes mellitus without complications: Secondary | ICD-10-CM | POA: Diagnosis not present

## 2023-12-15 DIAGNOSIS — R456 Violent behavior: Secondary | ICD-10-CM | POA: Diagnosis present

## 2023-12-15 DIAGNOSIS — R45851 Suicidal ideations: Secondary | ICD-10-CM | POA: Insufficient documentation

## 2023-12-15 DIAGNOSIS — I130 Hypertensive heart and chronic kidney disease with heart failure and stage 1 through stage 4 chronic kidney disease, or unspecified chronic kidney disease: Secondary | ICD-10-CM | POA: Insufficient documentation

## 2023-12-15 LAB — COMPREHENSIVE METABOLIC PANEL
ALT: 11 U/L (ref 0–44)
AST: 22 U/L (ref 15–41)
Albumin: 3.4 g/dL — ABNORMAL LOW (ref 3.5–5.0)
Alkaline Phosphatase: 82 U/L (ref 38–126)
Anion gap: 17 — ABNORMAL HIGH (ref 5–15)
BUN: 63 mg/dL — ABNORMAL HIGH (ref 6–20)
CO2: 22 mmol/L (ref 22–32)
Calcium: 7.4 mg/dL — ABNORMAL LOW (ref 8.9–10.3)
Chloride: 99 mmol/L (ref 98–111)
Creatinine, Ser: 2.43 mg/dL — ABNORMAL HIGH (ref 0.61–1.24)
GFR, Estimated: 30 mL/min — ABNORMAL LOW (ref >60)
Glucose, Bld: 150 mg/dL — ABNORMAL HIGH (ref 70–99)
Potassium: 3.7 mmol/L (ref 3.5–5.1)
Sodium: 138 mmol/L (ref 135–145)
Total Bilirubin: 0.8 mg/dL (ref 0.0–1.2)
Total Protein: 7.1 g/dL (ref 6.5–8.1)

## 2023-12-15 LAB — ACETAMINOPHEN LEVEL: Acetaminophen (Tylenol), Serum: <10 ug/mL — ABNORMAL LOW (ref 10–30)

## 2023-12-15 LAB — CBC
HCT: 32.6 % — ABNORMAL LOW (ref 39.0–52.0)
Hemoglobin: 10.7 g/dL — ABNORMAL LOW (ref 13.0–17.0)
MCH: 29.6 pg (ref 26.0–34.0)
MCHC: 32.8 g/dL (ref 30.0–36.0)
MCV: 90.3 fL (ref 80.0–100.0)
Platelets: 251 10*3/uL (ref 150–400)
RBC: 3.61 MIL/uL — ABNORMAL LOW (ref 4.22–5.81)
RDW: 12.3 % (ref 11.5–15.5)
WBC: 9.1 10*3/uL (ref 4.0–10.5)
nRBC: 0 % (ref 0.0–0.2)

## 2023-12-15 LAB — URINE DRUG SCREEN, QUALITATIVE (ARMC ONLY)
Amphetamines, Ur Screen: NOT DETECTED
Barbiturates, Ur Screen: NOT DETECTED
Benzodiazepine, Ur Scrn: NOT DETECTED
Cannabinoid 50 Ng, Ur ~~LOC~~: NOT DETECTED
Cocaine Metabolite,Ur ~~LOC~~: NOT DETECTED
MDMA (Ecstasy)Ur Screen: NOT DETECTED
Methadone Scn, Ur: NOT DETECTED
Opiate, Ur Screen: NOT DETECTED
Phencyclidine (PCP) Ur S: NOT DETECTED
Tricyclic, Ur Screen: NOT DETECTED

## 2023-12-15 LAB — SALICYLATE LEVEL: Salicylate Lvl: <7 mg/dL — ABNORMAL LOW (ref 7.0–30.0)

## 2023-12-15 LAB — ETHANOL: Alcohol, Ethyl (B): <10 mg/dL (ref <10)

## 2023-12-15 NOTE — Consult Note (Signed)
 [facility] Consult Note  Patient Name: Shane Chambers MRN: [id number] DOB: 1961/09/18 DATE OF Consult: 12/15/2023  PRIMARY PSYCHIATRIC DIAGNOSES  1.  Depression 2.  Anxiety 3.    RECOMMENDATIONS  Recommendations: Medication recommendations: None at this time. He is already on medications and has a psych NP who sees him regularly at the facility Non-Medication/therapeutic recommendations: Pt is interested in therapy, Therapy resources Is inpatient psychiatric hospitalization recommended for this patient? No (Explain why): Does not meet criteria From a psychiatric perspective, is this patient appropriate for discharge to an outpatient setting/resource or other less restrictive environment for continued care?  Yes (Explain why): he lives in a controlled environment Communication: Treatment team members (and family members if applicable) who were involved in treatment/care discussions and planning, and with whom we spoke or engaged with via secure text/chat, include the following: Epic chat with treatment team  Thank you for involving us  in the care of this patient. If you have any additional questions or concerns, please call [phone] and ask for me or the provider on-call.  TELEPSYCHIATRY ATTESTATION & CONSENT  As the provider for this telehealth consult, I attest that I verified the patient's identity using two separate identifiers, introduced myself to the patient, provided my credentials, disclosed my location, and performed this encounter via a HIPAA-compliant, real-time, face-to-face, two-way, interactive audio and video platform and with the full consent and agreement of the patient (or guardian as applicable.)  Patient physical location: [facility] ED. Telehealth provider physical location: home office in state of [place] .  Video start time: 2230 (Central Time) Video end time: 2250 (Central Time)  IDENTIFYING DATA  Shane Chambers is a 63 y.o. year-old male for whom a  psychiatric consultation has been ordered by the primary provider. The patient was identified using two separate identifiers.  CHIEF COMPLAINT/REASON FOR CONSULT  Sent in from his SNF due having a disagreement with them which got physical  HISTORY OF PRESENT ILLNESS (HPI)  The patient, per chart review, was sent to the ED due to striking staff and trying to kill himself by rolling his wheelchair into the road.  The patient denies this view of the events. He is very unhappy where he is living and has been trying to find a different place to live. It's not clear how realistic his expectation are. But he was not in a good mood and randomly decided he wanted to go to [employer] in his wheelchair. Apparently he has never gone to [employer] in his wheelchair. It sounds like there's probably no way to get there except on the road and that's where he was headed to see if he could make it out of the parking lot and to [employer].  Doesn't sound like a well-thought-out plan and for some reason staff thought he was going to will himself in the road and kill himself.  He denies that was never his intent.  The patient was very moody and grumpy with this provider and I had to be firm with him to get him to engage and complete psychiatric evaluation.  He has been at this facility for three years.  He does not want to be there anymore.  He has tried looking for apartments that he could move into with his wheelchair.  Most recently he found an advocacy group that did do an intake on him to see if they can help them find something different.  He currently is stating does not want assisted-living and again it is not clear at  all if he is capable of taking care of himself or if he has the finances.  But I did not go into that let them vent and talk about his frustrations.  I encouraged him to reach out to his insurance companies on his own with information that is looking for in regards to rides for medical visits etc. I  encouraged him to continue working with his advocacy group.  I advised him that he could talk to administration with some of his questions because the staff that works the floor would not have the answers that he is looking for.  He stated that he figured they wanted his income and so they wouldn't help him leave.  I advised him that there are probably more patients than there are beds and they could easily replace him so they may be more helpful than he thinks.  Before coming to this place he lived with his mother and it sounds like they are now estranged.  Regardless where she lives does not have running water  and is not somewhere that he can live.  The patient was still sad by the time we were done talking but he seemed less angry and had settled down some.  He is advised living conditions and is very frustrated.  He denies SI/HI/AVH.  PAST PSYCHIATRIC HISTORY  Patient has been hospitalized once for a few days but then was admitted to an acute facility from their.  Has had a suicide attempt in the past.  He is not currently in treatment but would like to have a therapist Otherwise as per HPI above.  PAST MEDICAL HISTORY  Past Medical History:  Diagnosis Date   Anemia    Arthritis    CAD (coronary artery disease)    a. s/p CABG in 03/2020 with LIMA-LAD, RIMA-PL, RA-[id number]-OM1   Cancer (HCC)    rectal   Cellulitis    COPD (chronic obstructive pulmonary disease) (HCC)    Depression    Diabetes mellitus without complication (HCC)    GERD (gastroesophageal reflux disease)    History of kidney stones    Hyperlipidemia 12/01/2019   Hypertension    Hypothyroidism    Ischemic cardiomyopathy    a. EF 20-25% by echo in 02/2020 b. at 40% by echo in 03/2020 c. EF normalized to 60-65% by echo in 04/2020   Myocardial infarction [facility])    Peripheral vascular disease (HCC)    Sleep apnea    Type 2 diabetes mellitus (HCC)      HOME MEDICATIONS  PTA Medications  Medication Sig   tamsulosin  (FLOMAX ) 0.4  MG CAPS capsule Take 0.8 mg by mouth every evening.   budesonide -formoterol  (SYMBICORT ) 160-4.5 MCG/ACT inhaler Inhale 2 puffs into the lungs in the morning and at bedtime.   aspirin  81 MG EC tablet Take 1 tablet (81 mg total) by mouth daily with breakfast.   levothyroxine  (SYNTHROID ) 75 MCG tablet Take 75 mcg by mouth daily before breakfast.   polyethylene glycol (MIRALAX  / GLYCOLAX ) 17 g packet Take 17 g by mouth daily as needed for moderate constipation.   acetaminophen  (TYLENOL ) 500 MG tablet Take 1,000 mg by mouth every 8 (eight) hours as needed for fever or moderate pain.   polyvinyl alcohol  (LIQUIFILM TEARS) 1.4 % ophthalmic solution Place 1 drop into both eyes daily.   omeprazole  (PRILOSEC) 40 MG capsule Take 40 mg by mouth in the morning.   albuterol  (VENTOLIN  HFA) 108 (90 Base) MCG/ACT inhaler Inhale 2 puffs into the lungs every  6 (six) hours as needed for wheezing or shortness of breath.   ipratropium-albuterol  (DUONEB) 0.5-2.5 (3) MG/3ML SOLN Take 3 mLs by nebulization every 6 (six) hours as needed. (Patient taking differently: Take 3 mLs by nebulization every 6 (six) hours as needed (SOB/wheezing).)   insulin  aspart (NOVOLOG  FLEXPEN) 100 UNIT/ML FlexPen 0-15 Units, Subcutaneous, 3 times daily with meals CBG < 70: implement hypoglycemia protocol-call MD CBG 70 - 120: 0 units CBG 121 - 150: 2 units CBG 151 - 200: 3 units CBG 201 - 250: 5 units CBG 251 - 300: 8 units CBG 301 - 350: 11 units CBG 351 - 400: 15 units CBG > 400:   thiamine  (VITAMIN B-1) 100 MG tablet Take 1 tablet (100 mg total) by mouth daily.   atorvastatin  (LIPITOR ) 40 MG tablet Take 1 tablet (40 mg total) by mouth at bedtime.   ferrous gluconate  (FERGON) 324 MG tablet Take 324 mg by mouth daily.   Vitamin D , Ergocalciferol , (DRISDOL ) 1.25 MG (50000 UNIT) CAPS capsule Take 50,000 Units by mouth every 7 (seven) days. (Mondays)   carvedilol  (COREG ) 25 MG tablet Take 1 tablet (25 mg total) by mouth 2 (two) times daily  with a meal.   bisacodyl  5 MG EC tablet Take 1 tablet (5 mg total) by mouth daily as needed for moderate constipation.   lubiprostone  (AMITIZA ) 8 MCG capsule Take 1 capsule (8 mcg total) by mouth 2 (two) times daily with a meal.   cetirizine (ZYRTEC) 10 MG chewable tablet Chew 10 mg by mouth daily.   Luliconazole 1 % CREA Apply 1 Application topically in the morning and at bedtime. To sacrum for moisture-related rash   potassium chloride  (KLOR-CON ) 10 MEQ tablet Take 10 mEq by mouth daily.   gabapentin  (NEURONTIN ) 300 MG capsule Take 300 mg by mouth 2 (two) times daily.   torsemide  (DEMADEX ) 20 MG tablet Take 2 tablets (40 mg total) by mouth 2 (two) times daily.   metolazone  (ZAROXOLYN ) 2.5 MG tablet Take 1 tablet (2.5 mg total) by mouth once a week. On Wednesdays   hydrOXYzine  (ATARAX ) 25 MG tablet Take 1 tablet (25 mg total) by mouth 3 (three) times daily as needed for anxiety.   traZODone  (DESYREL ) 50 MG tablet Take 1 tablet (50 mg total) by mouth at bedtime.   hydrALAZINE  (APRESOLINE ) 100 MG tablet Take 1 tablet (100 mg total) by mouth 2 (two) times daily.     ALLERGIES  Allergies  Allergen Reactions   Ceftriaxone  Anaphylaxis   Ace Inhibitors Swelling and Cough    (Not on MAR at [facility])   Chlorhexidine     Haloperidol  And Related     Do NOT give anti-psychotics due to risk of  Torsades and QT prolongation (per Dr. Trude request)   Other Itching    [name]   Shellfish Allergy     Listed on MAR   Penicillins Itching and Rash    Tolerated ancef  (12-17-22)    SOCIAL & SUBSTANCE USE HISTORY  Social History   Socioeconomic History   Marital status: Single    Spouse name: Not on file   Number of children: Not on file   Years of education: Not on file   Highest education level: Not on file  Occupational History   Not on file  Tobacco Use   Smoking status: Former    Current packs/day: 0.00    Types: Cigarettes    Quit date: 05/25/1995     Years since  quitting: 28.5   Smokeless tobacco: Never  Vaping Use   Vaping status: Never Used  Substance and Sexual Activity   Alcohol  use: Not Currently    Comment: rarely   Drug use: No   Sexual activity: Not Currently  Other Topics Concern   Not on file  Social History Narrative   Not on file   Social Drivers of Health   Financial Resource Strain: Not on file  Food Insecurity: No Food Insecurity (10/24/2023)   Hunger Vital Sign    Worried About Running Out of Food in the Last Year: Never true    Ran Out of Food in the Last Year: Never true  Transportation Needs: No Transportation Needs (10/24/2023)   PRAPARE - Administrator, Civil Service (Medical): No    Lack of Transportation (Non-Medical): No  Physical Activity: Not on file  Stress: Not on file  Social Connections: Not on file   Social History   Tobacco Use  Smoking Status Former   Current packs/day: 0.00   Types: Cigarettes   Quit date: 05/25/1995   Years since quitting: 28.5  Smokeless Tobacco Never   Social History   Substance and Sexual Activity  Alcohol  Use Not Currently   Comment: rarely   Social History   Substance and Sexual Activity  Drug Use No    Additional pertinent information .  FAMILY HISTORY  Family History  Problem Relation Age of Onset   Hypertension Mother    Family Psychiatric History (if known):  unknown  MENTAL STATUS EXAM (MSE)  Mental Status Exam: General Appearance: Disheveled  Orientation:  Full (Time, Place, and Person)  Memory:  Recent;   Fair Remote;   Fair  Concentration:  Concentration: Good and Attention Span: Fair  Recall:  Fair  Attention  Fair  Eye Contact:  Fair  Speech:  Clear and Coherent  Language:  Good  Volume:  Normal  Mood: depressed  Affect:  Blunt  Thought Process:  Coherent  Thought Content:  Rumination  Suicidal Thoughts:  No  Homicidal Thoughts:  No  Judgement:  Impaired  Insight:  Shallow  Psychomotor Activity:  Normal   Akathisia:  No  Fund of Knowledge:  Good    Assets:  Desire for Improvement Housing  Cognition:  WNLfor the context of this interview but without collateral I don't know how realistic his expectations are for being able to live in dependently  ADL's:  Intact  AIMS (if indicated):       VITALS  Blood pressure (!) 180/84, pulse 61, temperature 98.2 F (36.8 C), temperature source Oral, resp. rate 17, SpO2 98%.  LABS  Admission on 12/15/2023  Component Date Value Ref Range Status   Sodium 12/15/2023 138  135 - 145 mmol/L Final   Potassium 12/15/2023 3.7  3.5 - 5.1 mmol/L Final   Chloride 12/15/2023 99  98 - 111 mmol/L Final   CO2 12/15/2023 22  22 - 32 mmol/L Final   Glucose, Bld 12/15/2023 150 (H)  70 - 99 mg/dL Final   Glucose reference range applies only to samples taken after fasting for at least 8 hours.   BUN 12/15/2023 63 (H)  6 - 20 mg/dL Final   Creatinine, Ser 12/15/2023 2.43 (H)  0.61 - 1.24 mg/dL Final   Calcium  12/15/2023 7.4 (L)  8.9 - 10.3 mg/dL Final   Total Protein 97/95/7974 7.1  6.5 - 8.1 g/dL Final   Albumin  12/15/2023 3.4 (L)  3.5 - 5.0 g/dL Final  AST 12/15/2023 22  15 - 41 U/L Final   ALT 12/15/2023 11  0 - 44 U/L Final   Alkaline Phosphatase 12/15/2023 82  38 - 126 U/L Final   Total Bilirubin 12/15/2023 0.8  0.0 - 1.2 mg/dL Final   GFR, Estimated 12/15/2023 30 (L)  >60 mL/min Final   Comment: (NOTE) Calculated using the CKD-EPI Creatinine Equation (2021)    Anion gap 12/15/2023 17 (H)  5 - 15 Final   Performed at [facility], [address] [place], [place] [address]   Alcohol , Ethyl (B) 12/15/2023 <10  <10 mg/dL Final   Comment: (NOTE) Lowest detectable limit for serum alcohol  is 10 mg/dL.  For medical purposes only. Performed at [facility], [address] [place], [place] [address]    Salicylate Lvl 12/15/2023 <7.0 (L)  7.0 - 30.0 mg/dL Final   Performed at [facility], [address] [place], [place]  [address]   Acetaminophen  (Tylenol ), Serum 12/15/2023 <10 (L)  10 - 30 ug/mL Final   Comment: (NOTE) Therapeutic concentrations vary significantly. A range of 10-30 ug/mL  may be an effective concentration for many patients. However, some  are best treated at concentrations outside of this range. Acetaminophen  concentrations >150 ug/mL at 4 hours after ingestion  and >50 ug/mL at 12 hours after ingestion are often associated with  toxic reactions.  Performed at [facility], [address] [place], [place] [address]    WBC 12/15/2023 9.1  4.0 - 10.5 K/uL Final   RBC 12/15/2023 3.61 (L)  4.22 - 5.81 MIL/uL Final   Hemoglobin 12/15/2023 10.7 (L)  13.0 - 17.0 g/dL Final   HCT 97/95/7974 32.6 (L)  39.0 - 52.0 % Final   MCV 12/15/2023 90.3  80.0 - 100.0 fL Final   MCH 12/15/2023 29.6  26.0 - 34.0 pg Final   MCHC 12/15/2023 32.8  30.0 - 36.0 g/dL Final   RDW 97/95/7974 12.3  11.5 - 15.5 % Final   Platelets 12/15/2023 251  150 - 400 K/uL Final   nRBC 12/15/2023 0.0  0.0 - 0.2 % Final   Performed at [facility], [address] [place], [place] [address]   Tricyclic, Ur Screen 12/15/2023 NONE DETECTED  NONE DETECTED Final   Amphetamines, Ur Screen 12/15/2023 NONE DETECTED  NONE DETECTED Final   MDMA (Ecstasy)Ur Screen 12/15/2023 NONE DETECTED  NONE DETECTED Final   Cocaine Metabolite,Ur [place] 12/15/2023 NONE DETECTED  NONE DETECTED Final   Opiate, Ur Screen 12/15/2023 NONE DETECTED  NONE DETECTED Final   Phencyclidine (PCP) Ur S 12/15/2023 NONE DETECTED  NONE DETECTED Final   Cannabinoid 50 Ng, Ur [place] 12/15/2023 NONE DETECTED  NONE DETECTED Final   Barbiturates, Ur Screen 12/15/2023 NONE DETECTED  NONE DETECTED Final   Benzodiazepine, Ur Scrn 12/15/2023 NONE DETECTED  NONE DETECTED Final   Methadone Scn, Ur 12/15/2023 NONE DETECTED  NONE DETECTED Final   Comment: (NOTE) Tricyclics + metabolites, urine    Cutoff 1000 ng/mL Amphetamines + metabolites, urine  Cutoff 1000  ng/mL MDMA (Ecstasy), urine              Cutoff 500 ng/mL Cocaine Metabolite, urine          Cutoff 300 ng/mL Opiate + metabolites, urine        Cutoff 300 ng/mL Phencyclidine (PCP), urine         Cutoff 25 ng/mL Cannabinoid, urine  Cutoff 50 ng/mL Barbiturates + metabolites, urine  Cutoff 200 ng/mL Benzodiazepine, urine              Cutoff 200 ng/mL Methadone, urine                   Cutoff 300 ng/mL  The urine drug screen provides only a preliminary, unconfirmed analytical test result and should not be used for non-medical purposes. Clinical consideration and professional judgment should be applied to any positive drug screen result due to possible interfering substances. A more specific alternate chemical method must be used in order to obtain a confirmed analytical result. Gas chromatography / mass spectrometry (GC/MS) is the preferred confirm                          atory method. 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 and is working in rehab to become more independent      Gait & Station: Laying/Sitting      Pain Screening: Denies      Nutrition & Dental Concerns: no concerns  RISK FORMULATION/ASSESSMENT  Is the patient experiencing any suicidal or homicidal ideations: No  Protective factors considered for safety management: is future oriented  Risk factors/concerns considered for safety management:  Prior attempt Depression Physical illness/chronic pain Age over 75 Male gender  Is there a safety management plan with the patient and treatment team to minimize risk factors and promote protective factors: Yes           Explain: patient was monitored in the ED and hallway bed Is crisis care placement or  psychiatric hospitalization recommended: No     Based on my current evaluation and risk assessment, patient is determined at this time to be at:  Low risk  *RISK ASSESSMENT Risk assessment is a dynamic process; it is possible that this patient's condition, and risk level, may change. This should be re-evaluated and managed over time as appropriate. Please re-consult psychiatric consult services if additional assistance is needed in terms of risk assessment and management. If your team decides to discharge this patient, please advise the patient how to best access emergency psychiatric services, or to call 911, if their condition worsens or they feel unsafe in any way.   Madai Nuccio A Chevez Sambrano, NP Telepsychiatry Consult Services

## 2023-12-15 NOTE — ED Notes (Signed)
Patient belongings include: 1 multi-colored bag, 1 phone, 1 jacket

## 2023-12-15 NOTE — Consult Note (Incomplete)
[facility] Consult Note  Patient Name: Shane Chambers MRN: [id number] DOB: December 16, 1960 DATE OF Consult: 12/15/2023  PRIMARY PSYCHIATRIC DIAGNOSES  1.  Depression 2.  Anxiety 3.    RECOMMENDATIONS  Recommendations: Medication recommendations: None at this time. He is already on medications and has a psych NP who sees him regularly at the facility Non-Medication/therapeutic recommendations: Pt is interested in therapy, Therapy resources Is inpatient psychiatric hospitalization recommended for this patient? No (Explain why): Does not meet criteria From a psychiatric perspective, is this patient appropriate for discharge to an outpatient setting/resource or other less restrictive environment for continued care?  Yes (Explain why): he lives in a controlled environment Communication: Treatment team members (and family members if applicable) who were involved in treatment/care discussions and planning, and with whom we spoke or engaged with via secure text/chat, include the following: Epic chat with treatment team  Thank you for involving [place] in the care of this patient. If you have any additional questions or concerns, please call [phone] and ask for me or the provider on-call.  TELEPSYCHIATRY ATTESTATION & CONSENT  As the provider for this telehealth consult, I attest that I verified the patient's identity using two separate identifiers, introduced myself to the patient, provided my credentials, disclosed my location, and performed this encounter via a HIPAA-compliant, real-time, face-to-face, two-way, interactive audio and video platform and with the full consent and agreement of the patient (or guardian as applicable.)  Patient physical location: [facility] ED. Telehealth provider physical location: home office in state of [place].  Video start time: 22 (Central Time) Video end time: *** (Central Time)  IDENTIFYING DATA  Shane Chambers is a 63 y.o. year-old male for whom a psychiatric  consultation has been ordered by the primary provider. The patient was identified using two separate identifiers.  CHIEF COMPLAINT/REASON FOR CONSULT  ***  HISTORY OF PRESENT ILLNESS (HPI)  The patient ***.  PAST PSYCHIATRIC HISTORY  *** Otherwise as per HPI above.  PAST MEDICAL HISTORY  Past Medical History:  Diagnosis Date  . Anemia   . Arthritis   . CAD (coronary artery disease)    a. s/p CABG in 03/2020 with LIMA-LAD, RIMA-PL, RA-D1-RI-OM1  . Cancer (HCC)    rectal  . Cellulitis   . COPD (chronic obstructive pulmonary disease) (HCC)   . Depression   . Diabetes mellitus without complication (HCC)   . GERD (gastroesophageal reflux disease)   . History of kidney stones   . Hyperlipidemia 12/01/2019  . Hypertension   . Hypothyroidism   . Ischemic cardiomyopathy    a. EF 20-25% by echo in 02/2020 b. at 40% by echo in 03/2020 c. EF normalized to 60-65% by echo in 04/2020  . Myocardial infarction (HCC)   . Peripheral vascular disease (HCC)   . Sleep apnea   . Type 2 diabetes mellitus (HCC)    ***  HOME MEDICATIONS  PTA Medications  Medication Sig  . tamsulosin (FLOMAX) 0.4 MG CAPS capsule Take 0.8 mg by mouth every evening.  . budesonide-formoterol (SYMBICORT) 160-4.5 MCG/ACT inhaler Inhale 2 puffs into the lungs in the morning and at bedtime.  Marland Kitchen aspirin 81 MG EC tablet Take 1 tablet (81 mg total) by mouth daily with breakfast.  . levothyroxine (SYNTHROID) 75 MCG tablet Take 75 mcg by mouth daily before breakfast.  . polyethylene glycol (MIRALAX / GLYCOLAX) 17 g packet Take 17 g by mouth daily as needed for moderate constipation.  Marland Kitchen acetaminophen (TYLENOL) 500 MG tablet Take 1,000 mg  by mouth every 8 (eight) hours as needed for fever or moderate pain.  . polyvinyl alcohol (LIQUIFILM TEARS) 1.4 % ophthalmic solution Place 1 drop into both eyes daily.  Marland Kitchen omeprazole (PRILOSEC) 40 MG capsule Take 40 mg by mouth in the morning.  Marland Kitchen albuterol (VENTOLIN HFA) 108 (90 Base) MCG/ACT  inhaler Inhale 2 puffs into the lungs every 6 (six) hours as needed for wheezing or shortness of breath.  Marland Kitchen ipratropium-albuterol (DUONEB) 0.5-2.5 (3) MG/3ML SOLN Take 3 mLs by nebulization every 6 (six) hours as needed. (Patient taking differently: Take 3 mLs by nebulization every 6 (six) hours as needed (SOB/wheezing).)  . insulin aspart (NOVOLOG FLEXPEN) 100 UNIT/ML FlexPen 0-15 Units, Subcutaneous, 3 times daily with meals CBG < 70: implement hypoglycemia protocol-call MD CBG 70 - 120: 0 units CBG 121 - 150: 2 units CBG 151 - 200: 3 units CBG 201 - 250: 5 units CBG 251 - 300: 8 units CBG 301 - 350: 11 units CBG 351 - 400: 15 units CBG > 400:  . thiamine (VITAMIN B-1) 100 MG tablet Take 1 tablet (100 mg total) by mouth daily.  Marland Kitchen atorvastatin (LIPITOR) 40 MG tablet Take 1 tablet (40 mg total) by mouth at bedtime.  . ferrous gluconate (FERGON) 324 MG tablet Take 324 mg by mouth daily.  . Vitamin D, Ergocalciferol, (DRISDOL) 1.25 MG (50000 UNIT) CAPS capsule Take 50,000 Units by mouth every 7 (seven) days. (Mondays)  . carvedilol (COREG) 25 MG tablet Take 1 tablet (25 mg total) by mouth 2 (two) times daily with a meal.  . bisacodyl 5 MG EC tablet Take 1 tablet (5 mg total) by mouth daily as needed for moderate constipation.  Marland Kitchen lubiprostone (AMITIZA) 8 MCG capsule Take 1 capsule (8 mcg total) by mouth 2 (two) times daily with a meal.  . cetirizine (ZYRTEC) 10 MG chewable tablet Chew 10 mg by mouth daily.  . Luliconazole 1 % CREA Apply 1 Application topically in the morning and at bedtime. To sacrum for moisture-related rash  . potassium chloride (KLOR-CON) 10 MEQ tablet Take 10 mEq by mouth daily.  Marland Kitchen gabapentin (NEURONTIN) 300 MG capsule Take 300 mg by mouth 2 (two) times daily.  Marland Kitchen torsemide (DEMADEX) 20 MG tablet Take 2 tablets (40 mg total) by mouth 2 (two) times daily.  . metolazone (ZAROXOLYN) 2.5 MG tablet Take 1 tablet (2.5 mg total) by mouth once a week. On Wednesdays  . hydrOXYzine  (ATARAX) 25 MG tablet Take 1 tablet (25 mg total) by mouth 3 (three) times daily as needed for anxiety.  . traZODone (DESYREL) 50 MG tablet Take 1 tablet (50 mg total) by mouth at bedtime.  . hydrALAZINE (APRESOLINE) 100 MG tablet Take 1 tablet (100 mg total) by mouth 2 (two) times daily.   ***  ALLERGIES  Allergies  Allergen Reactions  . Ceftriaxone Anaphylaxis  . Ace Inhibitors Swelling and Cough    (Not on MAR at [facility])  . Chlorhexidine   . Haloperidol And Related     Do NOT give anti-psychotics due to risk of  Torsades and QT prolongation (per Dr. Teena Irani request)  . Other Itching    [name]  . Shellfish Allergy     Listed on MAR  . Penicillins Itching and Rash    Tolerated ancef (12-17-22)    SOCIAL & SUBSTANCE USE HISTORY  Social History   Socioeconomic History  . Marital status: Single    Spouse name: Not on file  .  Number of children: Not on file  . Years of education: Not on file  . Highest education level: Not on file  Occupational History  . Not on file  Tobacco Use  . Smoking status: Former    Current packs/day: 0.00    Types: Cigarettes    Quit date: 05/25/1995    Years since quitting: 28.5  . Smokeless tobacco: Never  Vaping Use  . Vaping status: Never Used  Substance and Sexual Activity  . Alcohol use: Not Currently    Comment: rarely  . Drug use: No  . Sexual activity: Not Currently  Other Topics Concern  . Not on file  Social History Narrative  . Not on file   Social Drivers of Health   Financial Resource Strain: Not on file  Food Insecurity: No Food Insecurity (10/24/2023)   Hunger Vital Sign   . Worried About Programme researcher, broadcasting/film/video in the Last Year: Never true   . Ran Out of Food in the Last Year: Never true  Transportation Needs: No Transportation Needs (10/24/2023)   PRAPARE - Transportation   . Lack of Transportation (Medical): No   . Lack of Transportation (Non-Medical): No  Physical Activity:  Not on file  Stress: Not on file  Social Connections: Not on file   Social History   Tobacco Use  Smoking Status Former  . Current packs/day: 0.00  . Types: Cigarettes  . Quit date: 05/25/1995  . Years since quitting: 28.5  Smokeless Tobacco Never   Social History   Substance and Sexual Activity  Alcohol Use Not Currently   Comment: rarely   Social History   Substance and Sexual Activity  Drug Use No    Additional pertinent information ***.  FAMILY HISTORY  Family History  Problem Relation Age of Onset  . Hypertension Mother    Family Psychiatric History (if known):  ***  MENTAL STATUS EXAM (MSE)  Mental Status Exam: General Appearance: {Appearance:22683}  Orientation:  {BHH ORIENTATION (PAA):22689}  Memory:  {BHH MEMORY:22881}  Concentration:  {Concentration:21399}  Recall:  {BHH GOOD/FAIR/POOR:22877}  Attention  {BH Attention Span:31825}  Eye Contact:  {BHH EYE CONTACT:22684}  Speech:  {Speech:22685}  Language:  {BHH GOOD/FAIR/POOR:22877}  Volume:  {Volume (PAA):22686}  Mood: ***  Affect:  {Affect (PAA):22687}  Thought Process:  {Thought Process (PAA):22688}  Thought Content:  {Thought Content:22690}  Suicidal Thoughts:  {ST/HT (PAA):22692}  Homicidal Thoughts:  {ST/HT (PAA):22692}  Judgement:  {Judgement (PAA):22694}  Insight:  {Insight (PAA):22695}  Psychomotor Activity:  {Psychomotor (PAA):22696}  Akathisia:  {BHH YES OR NO:22294}  Fund of Knowledge:  {BHH GOOD/FAIR/POOR:22877}    Assets:  {Assets (PAA):22698}  Cognition:  {chl bhh cognition:304700322}  ADL's:  {BHH ZOX'W:96045}  AIMS (if indicated):       VITALS  Blood pressure (!) 180/84, pulse 61, temperature 98.2 F (36.8 C), temperature source Oral, resp. rate 17, SpO2 98%.  LABS  Admission on 12/15/2023  Component Date Value Ref Range Status  . Sodium 12/15/2023 138  135 - 145 mmol/L Final  . Potassium 12/15/2023 3.7  3.5 - 5.1 mmol/L Final  . Chloride 12/15/2023 99  98 - 111 mmol/L  Final  . CO2 12/15/2023 22  22 - 32 mmol/L Final  . Glucose, Bld 12/15/2023 150 (H)  70 - 99 mg/dL Final   Glucose reference range applies only to samples taken after fasting for at least 8 hours.  . BUN 12/15/2023 63 (H)  6 - 20 mg/dL Final  . Creatinine, Ser 12/15/2023 2.43 (  H)  0.61 - 1.24 mg/dL Final  . Calcium 11/91/4782 7.4 (L)  8.9 - 10.3 mg/dL Final  . Total Protein 12/15/2023 7.1  6.5 - 8.1 g/dL Final  . Albumin 95/62/1308 3.4 (L)  3.5 - 5.0 g/dL Final  . AST 65/78/4696 22  15 - 41 U/L Final  . ALT 12/15/2023 11  0 - 44 U/L Final  . Alkaline Phosphatase 12/15/2023 82  38 - 126 U/L Final  . Total Bilirubin 12/15/2023 0.8  0.0 - 1.2 mg/dL Final  . GFR, Estimated 12/15/2023 30 (L)  >60 mL/min Final   Comment: (NOTE) Calculated using the CKD-EPI Creatinine Equation (2021)   . Anion gap 12/15/2023 17 (H)  5 - 15 Final   Performed at [facility], [address] [place] [place], [place] [address]  . Alcohol, Ethyl (B) 12/15/2023 <10  <10 mg/dL Final   Comment: (NOTE) Lowest detectable limit for serum alcohol is 10 mg/dL.  For medical purposes only. Performed at [facility], [address] [place], [place] [address]   . Salicylate Lvl 12/15/2023 <7.0 (L)  7.0 - 30.0 mg/dL Final   Performed at [facility], [address] [place] [place], [place] [address]  . Acetaminophen (Tylenol), Serum 12/15/2023 <10 (L)  10 - 30 ug/mL Final   Comment: (NOTE) Therapeutic concentrations vary significantly. A range of 10-30 ug/mL  may be an effective concentration for many patients. However, some  are best treated at concentrations outside of this range. Acetaminophen concentrations >150 ug/mL at 4 hours after ingestion  and >50 ug/mL at 12 hours after ingestion are often associated with  toxic reactions.  Performed at [facility], [address] [place], [place] [address]   . WBC 12/15/2023 9.1  4.0 - 10.5 K/uL Final  . RBC 12/15/2023 3.61 (L)  4.22 - 5.81  MIL/uL Final  . Hemoglobin 12/15/2023 10.7 (L)  13.0 - 17.0 g/dL Final  . HCT 64/40/3474 32.6 (L)  39.0 - 52.0 % Final  . MCV 12/15/2023 90.3  80.0 - 100.0 fL Final  . MCH 12/15/2023 29.6  26.0 - 34.0 pg Final  . MCHC 12/15/2023 32.8  30.0 - 36.0 g/dL Final  . RDW 25/95/6387 12.3  11.5 - 15.5 % Final  . Platelets 12/15/2023 251  150 - 400 K/uL Final  . nRBC 12/15/2023 0.0  0.0 - 0.2 % Final   Performed at [facility], [address], [place], [place] [address]  . Tricyclic, Ur Screen 12/15/2023 NONE DETECTED  NONE DETECTED Final  . Amphetamines, Ur Screen 12/15/2023 NONE DETECTED  NONE DETECTED Final  . MDMA (Ecstasy)Ur Screen 12/15/2023 NONE DETECTED  NONE DETECTED Final  . Cocaine Metabolite,Ur [place] 12/15/2023 NONE DETECTED  NONE DETECTED Final  . Opiate, Ur Screen 12/15/2023 NONE DETECTED  NONE DETECTED Final  . Phencyclidine (PCP) Ur S 12/15/2023 NONE DETECTED  NONE DETECTED Final  . Cannabinoid 50 Ng, Ur Stevens 12/15/2023 NONE DETECTED  NONE DETECTED Final  . Barbiturates, Ur Screen 12/15/2023 NONE DETECTED  NONE DETECTED Final  . Benzodiazepine, Ur Scrn 12/15/2023 NONE DETECTED  NONE DETECTED Final  . Methadone Scn, Ur 12/15/2023 NONE DETECTED  NONE DETECTED Final   Comment: (NOTE) Tricyclics + metabolites, urine    Cutoff 1000 ng/mL Amphetamines + metabolites, urine  Cutoff 1000 ng/mL MDMA (Ecstasy), urine              Cutoff 500 ng/mL Cocaine Metabolite, urine          Cutoff 300  ng/mL Opiate + metabolites, urine        Cutoff 300 ng/mL Phencyclidine (PCP), urine         Cutoff 25 ng/mL Cannabinoid, urine                 Cutoff 50 ng/mL Barbiturates + metabolites, urine  Cutoff 200 ng/mL Benzodiazepine, urine              Cutoff 200 ng/mL Methadone, urine                   Cutoff 300 ng/mL  The urine drug screen provides only a preliminary, unconfirmed analytical test result and should not be used for non-medical purposes. Clinical consideration and professional  judgment should be applied to any positive drug screen result due to possible interfering substances. A more specific alternate chemical method must be used in order to obtain a confirmed analytical result. Gas chromatography / mass spectrometry (GC/MS) is the preferred confirm                          atory method. Performed at [facility], [address], [place], [place] [address]     PSYCHIATRIC REVIEW OF SYSTEMS (ROS)  ROS: Notable for the following relevant positive findings: ROS  Additional findings:      Musculoskeletal: {Musculoskeletal neeeds/assessment:304550014}      Gait & Station: {Gait and Station:304550016}      Pain Screening: {Pain Description:304550015}      Nutrition & Dental Concerns: {Nutrition & Dental Concerns:304550017}  RISK FORMULATION/ASSESSMENT  Is the patient experiencing any suicidal or homicidal ideations: {yes/no:20286}       Explain if yes: *** Protective factors considered for safety management: ***  Risk factors/concerns considered for safety management: *** {CHL BH Risk Factors Safety Management:304550011}  Is there a safety management plan with the patient and treatment team to minimize risk factors and promote protective factors: {yes/no:20286}           Explain: *** Is crisis care placement or psychiatric hospitalization recommended: {yes/no:20286}     Based on my current evaluation and risk assessment, patient is determined at this time to be at:  {Risk level:304550009}  *RISK ASSESSMENT Risk assessment is a dynamic process; it is possible that this patient's condition, and risk level, may change. This should be re-evaluated and managed over time as appropriate. Please re-consult psychiatric consult services if additional assistance is needed in terms of risk assessment and management. If your team decides to discharge this patient, please advise the patient how to best access emergency psychiatric services, or to call 911, if  their condition worsens or they feel unsafe in any way.   Koren Shiver, NP Telepsychiatry Consult Services
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
## 2023-12-16 ENCOUNTER — Ambulatory Visit: Payer: Medicare Other

## 2023-12-16 DIAGNOSIS — F69 Unspecified disorder of adult personality and behavior: Secondary | ICD-10-CM | POA: Insufficient documentation

## 2023-12-16 NOTE — Consult Note (Addendum)
 Pt was seen by [facility] yesterday and psych cleared. Pt IVC was not rescinded. Pt was seen this morning. He is sleeping on approach, does waken to speak with me. He continues to deny SI, HI, AVH. He denies paranoia. There is no evidence of internal preoccupation, agitation, aggression or distractibility. No paranoia or delusions elicited. He tells me he does not like his living situation although understands he has nowhere else to go at this time and is willing to return.   [facility] [phone]. Spoke w/ Ms. Loy. She states she feels staff were not listening to the pt which may have resulted in him becoming frustrated. She does not have safety concerns with pt returning to the facility. She states they will speak with him. They also have therapy services at the facility and will have therapist speak with him as well.   Pt IVC rescinded. Primary team notified.

## 2023-12-16 NOTE — ED Notes (Signed)
IVC Rescinded by J.Lee NP @ 0900 patient D/C'd b ack to [facility]

## 2023-12-16 NOTE — ED Provider Notes (Signed)
 Emergency Medicine Observation Re-evaluation Note  Shane Chambers is a 63 y.o. male, seen on rounds today.  Pt initially presented to the ED for complaints of Psychiatric Evaluation Currently, the patient is resting, voices no medical complaints.  Physical Exam  BP (!) 180/84 (BP Location: Left Arm)   Pulse 61   Temp 98.2 F (36.8 C) (Oral)   Resp 17   SpO2 98%  Physical Exam General: Resting in no acute distress Cardiac: No cyanosis Lungs: Equal rise and fall Psych: Not agitated  ED Course / MDM  EKG:   I have reviewed the labs performed to date as well as medications administered while in observation.  Recent changes in the last 24 hours include no events overnight.  Plan  Current plan is for discharge back to [facility] in [place].  Patient was cleared by overnight psychiatry NP.  Awaiting psychiatry to rescind IVC.  Unfortunately there was no reply when I tried to contact the overnight psychiatric NP to inquire about IVC rescission.    Shane Chambers J, MD 12/16/23 [phone]

## 2023-12-16 NOTE — ED Provider Notes (Signed)
 Care of this patient assumed from prior physician at 0700 pending psychiatric disposition. Please see prior physician note for further details.  Notified by psychiatric NP that they had evaluated the patient and he denied any SI or HI.  They obtained collateral history from the patient's facility who is comfortable with plan for discharge.  I evaluated patient at bedside.  Resting comfortably in bed.  Denies SI, HI, comfortable with plan for discharge.  Patient discharged in stable condition.   Levander Slate, MD 12/16/23 [phone]

## 2023-12-16 NOTE — ED Notes (Signed)
 Breakfast provided.

## 2023-12-16 NOTE — ED Notes (Addendum)
Report called to [phone] [facility]. Pt wears chronic oxygen. Will need EMS to transport back to facility.

## 2023-12-16 NOTE — ED Notes (Addendum)
 Spoke to [employer] and Armed forces technical officer at [employer]. PT to be returning by [facility]. Discharged to [facility]. Left with prosthetic legs and glasses. Foley intact. Wearing 2L [place] oxygen. Alert.

## 2023-12-16 NOTE — ED Notes (Signed)
 IVC/Consult Completed/Rec.D/C back to SNF

## 2023-12-16 NOTE — Discharge Instructions (Signed)
Return to the ER for new or worsening symptoms. °

## 2023-12-25 ENCOUNTER — Encounter: Payer: Medicare Other | Attending: Nephrology | Admitting: Emergency Medicine

## 2023-12-25 VITALS — BP 140/66 | HR 52 | Temp 98.1°F | Resp 16

## 2023-12-25 DIAGNOSIS — D631 Anemia in chronic kidney disease: Secondary | ICD-10-CM | POA: Diagnosis not present

## 2023-12-25 DIAGNOSIS — N189 Chronic kidney disease, unspecified: Secondary | ICD-10-CM | POA: Diagnosis present

## 2023-12-25 MED ORDER — DIPHENHYDRAMINE HCL 25 MG PO CAPS
25.0000 mg | ORAL_CAPSULE | Freq: Once | ORAL | Status: AC
  Administered 2023-12-25: 25 mg via ORAL

## 2023-12-25 MED ORDER — FERUMOXYTOL INJECTION 510 MG/17 ML
510.0000 mg | Freq: Once | INTRAVENOUS | Status: AC
  Administered 2023-12-25: 510 mg via INTRAVENOUS
  Filled 2023-12-25: qty 17

## 2023-12-25 MED ORDER — ACETAMINOPHEN 325 MG PO TABS
650.0000 mg | ORAL_TABLET | Freq: Once | ORAL | Status: AC
  Administered 2023-12-25: 650 mg via ORAL

## 2023-12-25 NOTE — Progress Notes (Signed)
Diagnosis: Iron Deficiency Anemia  Provider:  Terrial Rhodes MD  Procedure: IV Infusion  IV Type: Peripheral, IV Location: R Forearm  Feraheme (Ferumoxytol), Dose: 510 mg  Infusion Start Time: 1458  Infusion Stop Time: 1514  Post Infusion IV Care: Observation period completed and Peripheral IV Discontinued  Discharge: Condition: Good, Destination: Home . AVS Provided  Performed by:  Arrie Senate, RN

## 2023-12-31 ENCOUNTER — Ambulatory Visit: Payer: Medicare Other | Admitting: Gastroenterology

## 2024-01-01 ENCOUNTER — Encounter: Payer: Medicare Other | Admitting: Emergency Medicine

## 2024-01-01 VITALS — BP 143/54 | HR 62 | Temp 102.5°F | Resp 18

## 2024-01-01 DIAGNOSIS — D631 Anemia in chronic kidney disease: Secondary | ICD-10-CM

## 2024-01-01 DIAGNOSIS — N189 Chronic kidney disease, unspecified: Secondary | ICD-10-CM | POA: Diagnosis not present

## 2024-01-01 MED ORDER — SODIUM CHLORIDE 0.9 % IV SOLN
510.0000 mg | Freq: Once | INTRAVENOUS | Status: AC
  Administered 2024-01-01: 510 mg via INTRAVENOUS
  Filled 2024-01-01: qty 17

## 2024-01-01 MED ORDER — ACETAMINOPHEN 325 MG PO TABS
650.0000 mg | ORAL_TABLET | Freq: Once | ORAL | Status: AC
  Administered 2024-01-01: 650 mg via ORAL

## 2024-01-01 MED ORDER — DIPHENHYDRAMINE HCL 25 MG PO CAPS
25.0000 mg | ORAL_CAPSULE | Freq: Once | ORAL | Status: AC
  Administered 2024-01-01: 25 mg via ORAL

## 2024-01-01 NOTE — Progress Notes (Signed)
Diagnosis: Iron Deficiency Anemia  Provider:  Terrial Rhodes MD  Procedure: IV Infusion  IV Type: Peripheral, IV Location: right wrist  Feraheme (Ferumoxytol), Dose: 510 mg  Infusion Start Time: 1434  Infusion Stop Time: 1452  Post Infusion IV Care: Observation period completed and Peripheral IV Discontinued  Discharge: Condition: Good, Skilled nursing facility, . AVS Provided  Performed by:  Arrie Senate, RN

## 2024-01-05 ENCOUNTER — Encounter (HOSPITAL_COMMUNITY): Payer: Self-pay | Admitting: Hematology

## 2024-01-07 ENCOUNTER — Emergency Department (HOSPITAL_COMMUNITY): Payer: Medicare Other

## 2024-01-07 ENCOUNTER — Emergency Department (HOSPITAL_COMMUNITY)
Admission: EM | Admit: 2024-01-07 | Discharge: 2024-01-07 | Disposition: A | Discharge status: Home / Self Care | Payer: Medicare Other

## 2024-01-07 ENCOUNTER — Encounter (HOSPITAL_COMMUNITY): Payer: Self-pay | Admitting: *Deleted

## 2024-01-07 ENCOUNTER — Other Ambulatory Visit: Payer: Self-pay

## 2024-01-07 DIAGNOSIS — Z951 Presence of aortocoronary bypass graft: Secondary | ICD-10-CM | POA: Insufficient documentation

## 2024-01-07 DIAGNOSIS — Z7982 Long term (current) use of aspirin: Secondary | ICD-10-CM | POA: Diagnosis not present

## 2024-01-07 DIAGNOSIS — I10 Essential (primary) hypertension: Secondary | ICD-10-CM | POA: Diagnosis not present

## 2024-01-07 DIAGNOSIS — Z794 Long term (current) use of insulin: Secondary | ICD-10-CM | POA: Insufficient documentation

## 2024-01-07 DIAGNOSIS — I2510 Atherosclerotic heart disease of native coronary artery without angina pectoris: Secondary | ICD-10-CM | POA: Diagnosis not present

## 2024-01-07 DIAGNOSIS — Z79899 Other long term (current) drug therapy: Secondary | ICD-10-CM | POA: Insufficient documentation

## 2024-01-07 DIAGNOSIS — R0789 Other chest pain: Secondary | ICD-10-CM | POA: Insufficient documentation

## 2024-01-07 DIAGNOSIS — Z79890 Hormone replacement therapy (postmenopausal): Secondary | ICD-10-CM | POA: Insufficient documentation

## 2024-01-07 DIAGNOSIS — E039 Hypothyroidism, unspecified: Secondary | ICD-10-CM | POA: Insufficient documentation

## 2024-01-07 DIAGNOSIS — R079 Chest pain, unspecified: Secondary | ICD-10-CM

## 2024-01-07 LAB — CBC
HCT: 32.6 % — ABNORMAL LOW (ref 39.0–52.0)
Hemoglobin: 10.6 g/dL — ABNORMAL LOW (ref 13.0–17.0)
MCH: 29.1 pg (ref 26.0–34.0)
MCHC: 32.5 g/dL (ref 30.0–36.0)
MCV: 89.6 fL (ref 80.0–100.0)
Platelets: 234 10*3/uL (ref 150–400)
RBC: 3.64 MIL/uL — ABNORMAL LOW (ref 4.22–5.81)
RDW: 12.5 % (ref 11.5–15.5)
WBC: 11.1 10*3/uL — ABNORMAL HIGH (ref 4.0–10.5)
nRBC: 0 % (ref 0.0–0.2)

## 2024-01-07 LAB — BASIC METABOLIC PANEL
Anion gap: 12 (ref 5–15)
BUN: 67 mg/dL — ABNORMAL HIGH (ref 8–23)
CO2: 24 mmol/L (ref 22–32)
Calcium: 7.4 mg/dL — ABNORMAL LOW (ref 8.9–10.3)
Chloride: 99 mmol/L (ref 98–111)
Creatinine, Ser: 2.9 mg/dL — ABNORMAL HIGH (ref 0.61–1.24)
GFR, Estimated: 24 mL/min — ABNORMAL LOW (ref >60)
Glucose, Bld: 137 mg/dL — ABNORMAL HIGH (ref 70–99)
Potassium: 3.6 mmol/L (ref 3.5–5.1)
Sodium: 135 mmol/L (ref 135–145)

## 2024-01-07 LAB — TROPONIN I (HIGH SENSITIVITY)
Troponin I (High Sensitivity): 15 ng/L (ref <18)
Troponin I (High Sensitivity): 14 ng/L (ref <18)

## 2024-01-07 LAB — CBG MONITORING, ED: Glucose-Capillary: 111 mg/dL — ABNORMAL HIGH (ref 70–99)

## 2024-01-07 MED ORDER — ONDANSETRON 4 MG PO TBDP
4.0000 mg | ORAL_TABLET | Freq: Three times a day (TID) | ORAL | 0 refills | Status: DC | PRN
Start: 2024-01-07 — End: 2024-02-20

## 2024-01-07 NOTE — ED Provider Notes (Signed)
 [place] EMERGENCY DEPARTMENT AT [facility] Provider Note   CSN: [id number] Arrival date & time: 01/07/24  1056     History  Chief Complaint  Patient presents with   Chest Pain    Shane Chambers is a 63 y.o. male.  63 year old male with past medical history of coronary artery disease, hypertension, hyperlipidemia, and hypothyroidism who is status post CABG presenting to the emergency department today with chest pressure.  The patient states this began earlier today.  Did have some nausea and diaphoresis with this.  States the pain is most on the left side of his chest and does go to his neck and arm.  He states that this came on while he was at rest.  He just been walking for rehab prior to this.  He states that the discomfort has improved.  He is still having some mild nausea.  He denies any vomiting.  He came to the ER for further evaluation regarding this.   Chest Pain Associated symptoms: nausea        Home Medications Prior to Admission medications   Medication Sig Start Date End Date Taking? Authorizing Provider  ondansetron (ZOFRAN-ODT) 4 MG disintegrating tablet Take 1 tablet (4 mg total) by mouth every 8 (eight) hours as needed for nausea or vomiting. 01/07/24  Yes Durwin Glaze, MD  acetaminophen (TYLENOL) 500 MG tablet Take 1,000 mg by mouth every 8 (eight) hours as needed for fever or moderate pain.    [provider]  albuterol (VENTOLIN HFA) 108 (90 Base) MCG/ACT inhaler Inhale 2 puffs into the lungs every 6 (six) hours as needed for wheezing or shortness of breath. 05/02/22   Cleora Fleet, MD  aspirin 81 MG EC tablet Take 1 tablet (81 mg total) by mouth daily with breakfast. 11/29/20   Mariea Clonts, Courage, MD  atorvastatin (LIPITOR) 40 MG tablet Take 1 tablet (40 mg total) by mouth at bedtime. 11/28/22   Gillis Santa, MD  bisacodyl 5 MG EC tablet Take 1 tablet (5 mg total) by mouth daily as needed for moderate constipation. 05/28/23   Lanelle Bal, DO  budesonide-formoterol [facility]) 160-4.5 MCG/ACT inhaler Inhale 2 puffs into the lungs in the morning and at bedtime. 05/24/20   Oretha Milch, MD  carvedilol (COREG) 25 MG tablet Take 1 tablet (25 mg total) by mouth 2 (two) times daily with a meal. 04/02/23   Meredeth Ide, MD  cetirizine (ZYRTEC) 10 MG chewable tablet Chew 10 mg by mouth daily.    [provider]  ferrous gluconate (FERGON) 324 MG tablet Take 324 mg by mouth daily.    [provider]  gabapentin (NEURONTIN) 300 MG capsule Take 300 mg by mouth 2 (two) times daily. 10/13/23   [provider]  hydrALAZINE (APRESOLINE) 100 MG tablet Take 1 tablet (100 mg total) by mouth 2 (two) times daily. 10/31/23   Vassie Loll, MD  hydrOXYzine (ATARAX) 25 MG tablet Take 1 tablet (25 mg total) by mouth 3 (three) times daily as needed for anxiety. 10/31/23   Vassie Loll, MD  insulin aspart (NOVOLOG FLEXPEN) 100 UNIT/ML FlexPen 0-15 Units, Subcutaneous, 3 times daily with meals CBG < 70: implement hypoglycemia protocol-call MD CBG 70 - 120: 0 units CBG 121 - 150: 2 units CBG 151 - 200: 3 units CBG 201 - 250: 5 units CBG 251 - 300: 8 units CBG 301 - 350: 11 units CBG 351 - 400: 15 units CBG > 400: 10/17/22  Ghimire, Werner Lean, MD  ipratropium-albuterol (DUONEB) 0.5-2.5 (3) MG/3ML SOLN Take 3 mLs by nebulization every 6 (six) hours as needed. Patient taking differently: Take 3 mLs by nebulization every 6 (six) hours as needed (SOB/wheezing). 10/17/22   Ghimire, Werner Lean, MD  levothyroxine (SYNTHROID) 75 MCG tablet Take 75 mcg by mouth daily before breakfast.    [provider]  lubiprostone (AMITIZA) 8 MCG capsule Take 1 capsule (8 mcg total) by mouth 2 (two) times daily with a meal. 09/29/23   Gelene Mink, NP  Luliconazole 1 % CREA Apply 1 Application topically in the morning and at bedtime. To sacrum for moisture-related rash 10/21/23   [provider]  metolazone (ZAROXOLYN) 2.5  MG tablet Take 1 tablet (2.5 mg total) by mouth once a week. On Wednesdays 10/31/23   Vassie Loll, MD  omeprazole (PRILOSEC) 40 MG capsule Take 40 mg by mouth in the morning. 01/01/22   [provider]  polyethylene glycol (MIRALAX / GLYCOLAX) 17 g packet Take 17 g by mouth daily as needed for moderate constipation.    [provider]  polyvinyl alcohol (LIQUIFILM TEARS) 1.4 % ophthalmic solution Place 1 drop into both eyes daily.    [provider]  potassium chloride (KLOR-CON) 10 MEQ tablet Take 10 mEq by mouth daily. 10/13/23   [provider]  tamsulosin (FLOMAX) 0.4 MG CAPS capsule Take 0.8 mg by mouth every evening.    [provider]  thiamine (VITAMIN B-1) 100 MG tablet Take 1 tablet (100 mg total) by mouth daily. 10/18/22   Ghimire, Werner Lean, MD  torsemide (DEMADEX) 20 MG tablet Take 2 tablets (40 mg total) by mouth 2 (two) times daily. 10/31/23   Vassie Loll, MD  traZODone (DESYREL) 50 MG tablet Take 1 tablet (50 mg total) by mouth at bedtime. 10/31/23   Vassie Loll, MD  Vitamin D, Ergocalciferol, (DRISDOL) 1.25 MG (50000 UNIT) CAPS capsule Take 50,000 Units by mouth every 7 (seven) days. (Mondays)    [provider]      Allergies    Ceftriaxone, Ace inhibitors, Chlorhexidine, Haloperidol and related, Other, Shellfish allergy, and Penicillins    Review of Systems   Review of Systems  Cardiovascular:  Positive for chest pain.  Gastrointestinal:  Positive for nausea.  All other systems reviewed and are negative.   Physical Exam Updated Vital Signs BP (!) 148/75 (BP Location: Left Arm)   Pulse (!) 54   Temp 97.9 F (36.6 C) (Oral)   Resp 18   Ht 5\' 10"  (1.778 m)   Wt 106.1 kg   SpO2 99%   BMI 33.58 kg/m  Physical Exam Vitals and nursing note reviewed.   Gen: NAD Eyes: PERRL, EOMI HEENT: no oropharyngeal swelling Neck: trachea midline Resp: clear to auscultation bilaterally Card: RRR, no murmurs, rubs, or  gallops Abd: nontender, nondistended Extremities: Left lower extremity surgically absent below the knee Vascular: 2+ radial pulses bilaterally, 2+ DP pulses bilaterally Neuro: No focal deficits Skin: no rashes   ED Results / Procedures / Treatments   Labs (all labs ordered are listed, but only abnormal results are displayed) Labs Reviewed  BASIC METABOLIC PANEL - Abnormal; Notable for the following components:      Result Value   Glucose, Bld 137 (*)    BUN 67 (*)    Creatinine, Ser 2.90 (*)    Calcium 7.4 (*)    GFR, Estimated 24 (*)    All other components within normal limits  CBC -  Abnormal; Notable for the following components:   WBC 11.1 (*)    RBC 3.64 (*)    Hemoglobin 10.6 (*)    HCT 32.6 (*)    All other components within normal limits  TROPONIN I (HIGH SENSITIVITY)  TROPONIN I (HIGH SENSITIVITY)    EKG EKG Interpretation Date/Time:  Thursday January 07 2024 11:25:01 EST Ventricular Rate:  53 PR Interval:  174 QRS Duration:  107 QT Interval:  497 QTC Calculation: 467 R Axis:   13  Text Interpretation: Sinus rhythm Nonspecific ST abnormality Borderline repolarization abnormality Confirmed by Beckey Downing [phone]) on 01/07/2024 12:02:04 PM  Radiology DG Chest Port 1 View Result Date: 01/07/2024 CLINICAL DATA:  Chest pain.  Nausea. EXAM: PORTABLE CHEST 1 VIEW COMPARISON:  10/24/2023. FINDINGS: Low lung volume. Bilateral lung fields are clear. Bilateral costophrenic angles are clear. Elevated right hemidiaphragm noted. Normal cardio-mediastinal silhouette. There are surgical staples along the heart border and sternotomy wires, status post CABG (coronary artery bypass graft). No acute osseous abnormalities. The soft tissues are within normal limits. IMPRESSION: No active disease. Electronically Signed   By: Jules Schick M.D.   On: 01/07/2024 14:38    Procedures Procedures    Medications Ordered in ED Medications - No data to display  ED Course/ Medical  Decision Making/ A&P                                 Medical Decision Making 63 year old male with past medical history of coronary artery disease status post CABG, hypertension, and hyperlipidemia presenting to the emergency department today with chest pressure.  I will further evaluate the patient here with basic labs Wels and EKG, chest x-ray, and troponin for further evaluation for ACS, pulmonary edema, pulmonary infiltrates, or pneumothorax.  Based on description of his symptoms suspicion for aortic dissection or pulmonary embolism is low at this time.  I will give the patient Zofran for nausea.  He reports that his chest pain has improved.  I will reevaluate for ultimate disposition.  The patient's EKG interpreted by me does not show any acute changes.  Has troponins here are negative.  Chest x-ray is unremarkable.  I did call and discussed his case with Dr. Diona Browner.  He recommends having him follow-up in the clinic as an outpatient.  The patient is discharged with return precautions.  Amount and/or Complexity of Data Reviewed Labs: ordered. Radiology: ordered.          Final Clinical Impression(s) / ED Diagnoses Final diagnoses:  Nonspecific chest pain    Rx / DC Orders ED Discharge Orders          Ordered    ondansetron (ZOFRAN-ODT) 4 MG disintegrating tablet  Every 8 hours PRN        01/07/24 1530              Durwin Glaze, MD 01/07/24 1530

## 2024-01-07 NOTE — Discharge Instructions (Signed)
 Your workup in the ER today was reassuring.  I did call and discussed this with our cardiologist on-call.  They recommend giving the cardiology office a call to see about scheduling follow-up as soon as possible.  Take the Zofran as needed for nausea.  Return to the ER for worsening symptoms.

## 2024-01-07 NOTE — ED Notes (Signed)
 [facility] called to transport patient. Nurse notified.

## 2024-01-07 NOTE — ED Triage Notes (Signed)
 Pt BIB [facility] from [facility], CP with nausea that started an hour prior to EMS arrival. Pt with foley catheter in place. Hx of cardiac surgery.4 baby ASA given at facility 140, cbg 101, temp 98.7 oral.

## 2024-01-07 NOTE — ED Notes (Signed)
 CBG 111

## 2024-02-09 DIAGNOSIS — Z978 Presence of other specified devices: Secondary | ICD-10-CM | POA: Insufficient documentation

## 2024-02-09 DIAGNOSIS — R339 Retention of urine, unspecified: Secondary | ICD-10-CM | POA: Insufficient documentation

## 2024-02-09 DIAGNOSIS — R262 Difficulty in walking, not elsewhere classified: Secondary | ICD-10-CM | POA: Insufficient documentation

## 2024-02-09 DIAGNOSIS — N319 Neuromuscular dysfunction of bladder, unspecified: Secondary | ICD-10-CM | POA: Insufficient documentation

## 2024-02-09 DIAGNOSIS — Z9359 Other cystostomy status: Secondary | ICD-10-CM | POA: Insufficient documentation

## 2024-02-09 DIAGNOSIS — Z789 Other specified health status: Secondary | ICD-10-CM | POA: Insufficient documentation

## 2024-02-09 NOTE — Progress Notes (Deleted)
 Name: Shane Chambers DOB: 09-20-1961 MRN: [id number]  History of Present Illness: [name] is a 63 y.o. male who presents today for follow up visit at [facility] [place].  ***He is accompanied by ***. GU History includes: 1. BPH with BOO & LUTS.  - 02/14/2023: Prostate size ~24 ml per CT.  - ***Taking Flomax (Tamsulosin) 0.4 mg daily. 2. Mild hypospadias. Per exam in ER on 04/16/2023. 3. Kidney stone(s). Has passed stoned spontaneously without surgical intervention.  At last visit with Dr. Annabell Howells on 04/16/2023: - Seen for urinary retention with Foley catheter in place since 02/15/2023. - Foley catheter discontinued for voiding trial with plan to follow up in clinic 1 day later for PVR check. - Later that same day he was seen in the ER for acute urinary retention. Indwelling Foley catheter replaced. - He was subsequently lost to Urology follow up.  Since last visit: > 07/30/2023: CT abdomen/pelvis w/ contrast showed no GU stones, masses, or hydronephrosis; prostate unremarkable. Bladder decompressed by an indwelling Foley catheter.   Today: He reports ***discomfort related to his indwelling Foley catheter.  He reports the catheter is draining ***.  It was last exchanged ***.  He {Actions; denies-reports:120008} gross hematuria.  He {Actions; denies-reports:120008} flank pain or abdominal pain. He {Actions; denies-reports:120008} fevers, nausea, or vomiting.  ***We discussed likely neurogenic bladder with chronic urinary retention based on his risk factors including poorly T2DM with neuropathy and nicotine use.   ***SP tube?   Medications: Current Outpatient Medications  Medication Sig Dispense Refill   acetaminophen (TYLENOL) 500 MG tablet Take 1,000 mg by mouth every 8 (eight) hours as needed for fever or moderate pain.     albuterol (VENTOLIN HFA) 108 (90 Base) MCG/ACT inhaler Inhale 2 puffs into the lungs every 6 (six) hours as needed for wheezing or shortness of  breath.     aspirin 81 MG EC tablet Take 1 tablet (81 mg total) by mouth daily with breakfast. 30 tablet 12   atorvastatin (LIPITOR) 40 MG tablet Take 1 tablet (40 mg total) by mouth at bedtime.     bisacodyl 5 MG EC tablet Take 1 tablet (5 mg total) by mouth daily as needed for moderate constipation. 30 tablet 0   budesonide-formoterol (SYMBICORT) 160-4.5 MCG/ACT inhaler Inhale 2 puffs into the lungs in the morning and at bedtime. 1 Inhaler 6   carvedilol (COREG) 25 MG tablet Take 1 tablet (25 mg total) by mouth 2 (two) times daily with a meal.     cetirizine (ZYRTEC) 10 MG chewable tablet Chew 10 mg by mouth daily.     ferrous gluconate (FERGON) 324 MG tablet Take 324 mg by mouth daily.     gabapentin (NEURONTIN) 300 MG capsule Take 300 mg by mouth 2 (two) times daily.     hydrALAZINE (APRESOLINE) 100 MG tablet Take 1 tablet (100 mg total) by mouth 2 (two) times daily.     hydrOXYzine (ATARAX) 25 MG tablet Take 1 tablet (25 mg total) by mouth 3 (three) times daily as needed for anxiety. 30 tablet 0   insulin aspart (NOVOLOG FLEXPEN) 100 UNIT/ML FlexPen 0-15 Units, Subcutaneous, 3 times daily with meals CBG < 70: implement hypoglycemia protocol-call MD CBG 70 - 120: 0 units CBG 121 - 150: 2 units CBG 151 - 200: 3 units CBG 201 - 250: 5 units CBG 251 - 300: 8 units CBG 301 - 350: 11 units CBG 351 - 400: 15 units CBG > 400: 15 mL 0  ipratropium-albuterol (DUONEB) 0.5-2.5 (3) MG/3ML SOLN Take 3 mLs by nebulization every 6 (six) hours as needed. (Patient taking differently: Take 3 mLs by nebulization every 6 (six) hours as needed (SOB/wheezing).) 360 mL    levothyroxine (SYNTHROID) 75 MCG tablet Take 75 mcg by mouth daily before breakfast.     lubiprostone (AMITIZA) 8 MCG capsule Take 1 capsule (8 mcg total) by mouth 2 (two) times daily with a meal. 60 capsule 5   Luliconazole 1 % CREA Apply 1 Application topically in the morning and at bedtime. To sacrum for moisture-related rash      metolazone (ZAROXOLYN) 2.5 MG tablet Take 1 tablet (2.5 mg total) by mouth once a week. On Wednesdays     omeprazole (PRILOSEC) 40 MG capsule Take 40 mg by mouth in the morning.     ondansetron (ZOFRAN-ODT) 4 MG disintegrating tablet Take 1 tablet (4 mg total) by mouth every 8 (eight) hours as needed for nausea or vomiting. 20 tablet 0   polyethylene glycol (MIRALAX / GLYCOLAX) 17 g packet Take 17 g by mouth daily as needed for moderate constipation.     polyvinyl alcohol (LIQUIFILM TEARS) 1.4 % ophthalmic solution Place 1 drop into both eyes daily.     potassium chloride (KLOR-CON) 10 MEQ tablet Take 10 mEq by mouth daily.     tamsulosin (FLOMAX) 0.4 MG CAPS capsule Take 0.8 mg by mouth every evening.     thiamine (VITAMIN B-1) 100 MG tablet Take 1 tablet (100 mg total) by mouth daily.     torsemide (DEMADEX) 20 MG tablet Take 2 tablets (40 mg total) by mouth 2 (two) times daily.     traZODone (DESYREL) 50 MG tablet Take 1 tablet (50 mg total) by mouth at bedtime. 30 tablet 1   Vitamin D, Ergocalciferol, (DRISDOL) 1.25 MG (50000 UNIT) CAPS capsule Take 50,000 Units by mouth every 7 (seven) days. (Mondays)     No current facility-administered medications for this visit.    Allergies: Allergies  Allergen Reactions   Ceftriaxone Anaphylaxis   Ace Inhibitors Swelling and Cough   Chlorhexidine    Haloperidol And Related     Do NOT give anti-psychotics due to risk of  Torsades and QT prolongation (per Dr. Teena Irani request)   Other Itching    [name]   Shellfish Allergy     Listed on MAR   Penicillins Itching and Rash    Tolerated ancef (12-17-22)    Past Medical History:  Diagnosis Date   Anemia    Arthritis    CAD (coronary artery disease)    a. s/p CABG in 03/2020 with LIMA-LAD, RIMA-PL, RA-D1-RI-OM1   Cancer (HCC)    rectal   Cellulitis    COPD (chronic obstructive pulmonary disease) (HCC)    Depression    Diabetes mellitus without complication (HCC)    GERD  (gastroesophageal reflux disease)    History of kidney stones    Hyperlipidemia 12/01/2019   Hypertension    Hypothyroidism    Ischemic cardiomyopathy    a. EF 20-25% by echo in 02/2020 b. at 40% by echo in 03/2020 c. EF normalized to 60-65% by echo in 04/2020   Myocardial infarction [facility])    Peripheral vascular disease (HCC)    Sleep apnea    Type 2 diabetes mellitus (HCC)    Past Surgical History:  Procedure Laterality Date   AMPUTATION Right 10/05/2022   Procedure: AMPUTATION BELOW KNEE;  Surgeon: Nadara Mustard, MD;  Location: [facility] OR;  Service: Orthopedics;  Laterality: Right;   AMPUTATION Right 11/14/2022   Procedure: AMPUTATION BELOW KNEE REVISION; WASHOUT;  Surgeon: Renford Dills, MD;  Location: [facility] ORS;  Service: Vascular;  Laterality: Right;   AMPUTATION Right 11/18/2022   Procedure: AMPUTATION BELOW KNEE REVISION AND CLOSURE;  Surgeon: Renford Dills, MD;  Location: [facility] ORS;  Service: Vascular;  Laterality: Right;   APPLICATION OF WOUND VAC Right 10/05/2022   Procedure: APPLICATION OF WOUND VAC;  Surgeon: Nadara Mustard, MD;  Location: [facility] OR;  Service: Orthopedics;  Laterality: Right;   BIOPSY  07/18/2021   Procedure: BIOPSY;  Surgeon: Lanelle Bal, DO;  Location: [facility];  Service: Endoscopy;;   BIOPSY  01/09/2022   Procedure: BIOPSY;  Surgeon: Lanelle Bal, DO;  [facility];  Service: Endoscopy;;   BIOPSY  06/29/2023   Procedure: BIOPSY;  Surgeon: Lanelle Bal, DO;  [facility]: [facility];  Service: Endoscopy;;   COLONOSCOPY WITH PROPOFOL N/A 07/18/2021   Carver: 15 millimeter polyp removed from the sigmoid colon, 5 mm polyp removed from sigmoid colon.  Nonbleeding internal hemorrhoids.  Significant looping of the colon. sigmoid path showed invasive colonic adenocarcinoma involving tubular adenoma (invades to depth of 2mm, carcinoma 1mm from margin, no lymphovascular invasion, no poorly differentiated component.   COLONOSCOPY WITH  PROPOFOL N/A 06/29/2023   Procedure: COLONOSCOPY WITH PROPOFOL;  Surgeon: Lanelle Bal, DO;  [facility];  Service: Endoscopy;  Laterality: N/A;  7:30 am, asa 3   CORONARY ARTERY BYPASS GRAFT N/A 03/13/2020   Procedure: CORONARY ARTERY BYPASS GRAFTING (CABG) times five using bilateral Internal mammary arteries and left radial artery;  Surgeon: Linden Dolin, MD;  Location: [facility] OR;  Service: Open Heart Surgery;  Laterality: N/A;   DENTAL SURGERY     ESOPHAGOGASTRODUODENOSCOPY (EGD) WITH PROPOFOL N/A 07/18/2021   Carver: 1 gastric polyp status post biopsy, gastritis. gastric bx with slight chronic inflammation and no H.pyori. GEJ polypectomy with mild inflammation only   FLEXIBLE SIGMOIDOSCOPY N/A 08/26/2021   Carver: Nonbleeding internal hemorrhoids.  15 mm ulcers from previous polypectomy found in the rectum.  No evidence of previous polyp.  Located 5 to 8 cm from anal verge.   FLEXIBLE SIGMOIDOSCOPY N/A 01/09/2022   Procedure: FLEXIBLE SIGMOIDOSCOPY;  Surgeon: Lanelle Bal, DO;  [facility];  Service: Endoscopy;  Laterality: N/A;   POLYPECTOMY  07/18/2021   Procedure: POLYPECTOMY INTESTINAL;  Surgeon: Lanelle Bal, DO;  [facility];  Service: Endoscopy;;   POLYPECTOMY  06/29/2023   Procedure: POLYPECTOMY INTESTINAL;  Surgeon: Lanelle Bal, DO;  [facility];  Service: Endoscopy;;   RADIAL ARTERY HARVEST Left 03/13/2020   Procedure: RADIAL ARTERY HARVEST,;  Surgeon: Linden Dolin, MD;  Location: [facility];  Service: Open Heart Surgery;  Laterality: Left;   RIGHT/LEFT HEART CATH AND CORONARY ANGIOGRAPHY N/A 03/07/2020   Procedure: RIGHT/LEFT HEART CATH AND CORONARY ANGIOGRAPHY;  Surgeon: Tonny Bollman, MD;  [facility] INV[facility]IVE CV [facility];  Service: Cardiovascular;  Laterality: N/A;   STUMP REVISION Right 12/17/2022   Procedure: RIGHT BELOW KNEE AMPUTATION REVISION;  Surgeon: Nadara Mustard, MD;  Location: [facility] OR;  Service: Orthopedics;   Laterality: Right;   SUBMUCOSAL LIFTING INJECTION  01/09/2022   Procedure: SUBMUCOSAL LIFTING INJECTION;  Surgeon: Lanelle Bal, DO;  [facility];  Service: Endoscopy;;   SUBMUCOSAL TATTOO INJECTION  01/09/2022   Procedure: SUBMUCOSAL TATTOO INJECTION;  Surgeon: Earnest Bailey  K, DO;  [facility];  Service: Endoscopy;;   TEE WITHOUT CARDIOVERSION N/A 03/13/2020   Procedure: TRANSESOPHAGEAL ECHOCARDIOGRAM (TEE);  Surgeon: Linden Dolin, MD;  Location: [facility] OR;  Service: Open Heart Surgery;  Laterality: N/A;   Family History  Problem Relation Age of Onset   Hypertension Mother    Social History   Socioeconomic History   Marital status: Single    Spouse name: Not on file   Number of children: Not on file   Years of education: Not on file   Highest education level: Not on file  Occupational History   Not on file  Tobacco Use   Smoking status: Former    Current packs/day: 0.00    Types: Cigarettes    Quit date: 05/25/1995    Years since quitting: 28.7   Smokeless tobacco: Never  Vaping Use   Vaping status: Never Used  Substance and Sexual Activity   Alcohol use: Not Currently    Comment: rarely   Drug use: No   Sexual activity: Not Currently  Other Topics Concern   Not on file  Social History Narrative   Not on file   Social Drivers of Health   Financial Resource Strain: Not on file  Food Insecurity: No Food Insecurity (10/24/2023)   Hunger Vital Sign    Worried About Running Out of Food in the Last Year: Never true    Ran Out of Food in the Last Year: Never true  Transportation Needs: No Transportation Needs (10/24/2023)   PRAPARE - Administrator, Civil Service (Medical): No    Lack of Transportation (Non-Medical): No  Physical Activity: Not on file  Stress: Not on file  Social Connections: Not on file  Intimate Partner Violence: Not At Risk (10/24/2023)   Humiliation, Afraid, Rape, and Kick questionnaire    Fear of Current  or Ex-Partner: No    Emotionally Abused: No    Physically Abused: No    Sexually Abused: No    Review of Systems Constitutional: Patient denies any unintentional weight loss or change in strength lntegumentary: Patient denies any rashes or pruritus Cardiovascular: Patient denies chest pain or syncope Respiratory: Patient denies shortness of breath Gastrointestinal: ***Patient {Actions; denies-reports:120008} ***nausea, ***vomiting, ***constipation, ***diarrhea ***As per HPI Musculoskeletal: Patient denies muscle cramps or weakness Neurologic: Patient denies convulsions or seizures Allergic/Immunologic: Patient denies recent allergic reaction(s) Hematologic/Lymphatic: Patient denies bleeding tendencies Endocrine: Patient denies heat/cold intolerance  GU: As per HPI.  OBJECTIVE There were no vitals filed for this visit. There is no height or weight on file to calculate BMI.  Physical Examination Constitutional: No obvious distress; patient is non-toxic appearing  Cardiovascular: No visible lower extremity edema.  Respiratory: The patient does not have audible wheezing/stridor; respirations do not appear labored  Gastrointestinal: Abdomen non-distended Musculoskeletal: Normal ROM of UEs  Skin: No obvious rashes/open sores  Neurologic: CN 2-12 grossly intact Psychiatric: Answered questions appropriately with normal affect  Hematologic/Lymphatic/Immunologic: No obvious bruises or sites of spontaneous bleeding  GU: Catheter draining ***clear yellow urine.  ASSESSMENT Chronic retention of urine  Neurogenic bladder  Foley catheter in place  Ambulatory dysfunction  Medically complex patient  Benign prostatic hyperplasia with urinary retention  We discussed likely neurogenic bladder with chronic urinary retention based on his risk factors including poorly T2DM with neuropathy and nicotine use.   He is not a viable candidate for clean intermittent catheterization (CIC) due  to his ambulatory dysfunction and other comorbidities.  We reviewed options  to address chronic urinary retention including ongoing use of indwelling Foley catheter versus suprapubic (SP) tube/catheter placement.  - One significant disadvantage with long term use of an indwelling Foley catheter is the risk for erosion of the urethral sphincter, which may result in irreversible gross urinary incontinence over time. - SP tube is placed via an outpatient surgical procedure in the OR under anesthesia. SP tube stays in place continuously. The insertion site will close if catheter is removed for more than a few hours (and is therefore a reversible intervention). Surgical risks may include but are not limited to: surgical site pain, wound infection, anesthesia risk, symptomatic UTI. SP tube may cause local skin irritation / breakdown, however this is generally preventable by keeping area clean and dry. - Both indwelling Foley catheter and SP tube need to be exchanged every 4 weeks by a nurse (either in office or via home health) to prevent catheter from becoming clogged with sediment.  - With any form of chronic catheter use their urine will always appear infected on UA due to colonization, which is called asymptomatic bacteriuria. Patient was informed that we would not advise antibiotic treatment for a positive UA or positive urine culture in a patient using catheters unless systemic symptoms suggesting UTI are present (such as new onset or worsening of fever, rigors, altered mental status, malaise, or lethargy with no other identified cause; flank pain; CVA tenderness).   Ultimately he elected to  ***continue with indwelling Foley catheter  ***proceed with SP tube placement  We agreed to plan for follow up in *** months or sooner if needed. Patient verbalized understanding of and agreement with current plan. All questions were answered.  PLAN Advised the following: 1. *** 2. ***No follow-ups on  file.  No orders of the defined types were placed in this encounter.   It has been explained that the patient is to follow regularly with their PCP in addition to all other providers involved in their care and to follow instructions provided by these respective offices. Patient advised to contact urology clinic if any urologic-pertaining questions, concerns, new symptoms or problems arise in the interim period.  There are no Patient Instructions on file for this visit.  Electronically signed by:  Donnita Falls, FNP   02/09/24    5:08 PM

## 2024-02-11 ENCOUNTER — Ambulatory Visit: Admitting: Urology

## 2024-02-11 DIAGNOSIS — Z978 Presence of other specified devices: Secondary | ICD-10-CM

## 2024-02-11 DIAGNOSIS — R262 Difficulty in walking, not elsewhere classified: Secondary | ICD-10-CM

## 2024-02-11 DIAGNOSIS — R339 Retention of urine, unspecified: Secondary | ICD-10-CM

## 2024-02-11 DIAGNOSIS — Z789 Other specified health status: Secondary | ICD-10-CM

## 2024-02-11 DIAGNOSIS — N401 Enlarged prostate with lower urinary tract symptoms: Secondary | ICD-10-CM

## 2024-02-11 DIAGNOSIS — N319 Neuromuscular dysfunction of bladder, unspecified: Secondary | ICD-10-CM

## 2024-02-15 NOTE — Progress Notes (Unsigned)
 Name: Shane Chambers DOB: 10/06/1961 MRN: [id number]  History of Present Illness: Shane Chambers is a 63 y.o. male who presents today for follow up visit at [facility]. He resides at [name] and [employer] is accompanied by Shane Chambers (transportation person). GU History includes: 1. BPH with BOO & LUTS.  - 02/14/2023: Prostate size ~24 ml per CT.  - Taking Flomax (Tamsulosin) 0.4 mg daily. 2. Mild hypospadias. Per exam in ER on 04/16/2023. 3. Kidney stone(s). Has passed stoned spontaneously without surgical intervention.  At last visit with Dr. Annabell Howells on 04/16/2023: - Seen for urinary retention with Foley catheter in place since 02/15/2023. - Foley catheter discontinued for voiding trial with plan to follow up in clinic 1 day later for PVR check. - Later that same day he was seen in the ER for acute urinary retention. Indwelling Foley catheter replaced. - He was subsequently lost to Urology follow up.  Since last visit: > 07/30/2023: CT abdomen/pelvis w/ contrast showed no GU stones, masses, or hydronephrosis; prostate unremarkable. Bladder decompressed by an indwelling Foley catheter.   Today: He reports discomfort related to his indwelling Foley catheter. Feels that the balloon was inflated in the urethra the last time it was exchanged at his facility.    Medications: Current Outpatient Medications  Medication Sig Dispense Refill   acetaminophen (TYLENOL) 500 MG tablet Take 1,000 mg by mouth every 8 (eight) hours as needed for fever or moderate pain.     albuterol (VENTOLIN HFA) 108 (90 Base) MCG/ACT inhaler Inhale 2 puffs into the lungs every 6 (six) hours as needed for wheezing or shortness of breath.     aspirin 81 MG EC tablet Take 1 tablet (81 mg total) by mouth daily with breakfast. 30 tablet 12   atorvastatin (LIPITOR) 40 MG tablet Take 1 tablet (40 mg total) by mouth at bedtime.     bisacodyl 5 MG EC tablet Take 1 tablet (5 mg total) by mouth  daily as needed for moderate constipation. 30 tablet 0   budesonide-formoterol (SYMBICORT) 160-4.5 MCG/ACT inhaler Inhale 2 puffs into the lungs in the morning and at bedtime. 1 Inhaler 6   carvedilol (COREG) 25 MG tablet Take 1 tablet (25 mg total) by mouth 2 (two) times daily with a meal.     cetirizine (ZYRTEC) 10 MG chewable tablet Chew 10 mg by mouth daily.     ferrous gluconate (FERGON) 324 MG tablet Take 324 mg by mouth daily.     gabapentin (NEURONTIN) 300 MG capsule Take 300 mg by mouth 2 (two) times daily.     hydrALAZINE (APRESOLINE) 100 MG tablet Take 1 tablet (100 mg total) by mouth 2 (two) times daily.     hydrOXYzine (ATARAX) 25 MG tablet Take 1 tablet (25 mg total) by mouth 3 (three) times daily as needed for anxiety. 30 tablet 0   insulin aspart (NOVOLOG FLEXPEN) 100 UNIT/ML FlexPen 0-15 Units, Subcutaneous, 3 times daily with meals CBG < 70: implement hypoglycemia protocol-call MD CBG 70 - 120: 0 units CBG 121 - 150: 2 units CBG 151 - 200: 3 units CBG 201 - 250: 5 units CBG 251 - 300: 8 units CBG 301 - 350: 11 units CBG 351 - 400: 15 units CBG > 400: 15 mL 0   ipratropium-albuterol (DUONEB) 0.5-2.5 (3) MG/3ML SOLN Take 3 mLs by nebulization every 6 (six) hours as needed. (Patient taking differently: Take 3 mLs by nebulization every 6 (six) hours as needed (SOB/wheezing).) 360 mL  levothyroxine (SYNTHROID) 75 MCG tablet Take 75 mcg by mouth daily before breakfast.     lubiprostone (AMITIZA) 8 MCG capsule Take 1 capsule (8 mcg total) by mouth 2 (two) times daily with a meal. 60 capsule 5   Luliconazole 1 % CREA Apply 1 Application topically in the morning and at bedtime. To sacrum for moisture-related rash     metolazone (ZAROXOLYN) 2.5 MG tablet Take 1 tablet (2.5 mg total) by mouth once a week. On Wednesdays     omeprazole (PRILOSEC) 40 MG capsule Take 40 mg by mouth in the morning.     ondansetron (ZOFRAN-ODT) 4 MG disintegrating tablet Take 1 tablet (4 mg total) by mouth  every 8 (eight) hours as needed for nausea or vomiting. 20 tablet 0   polyethylene glycol (MIRALAX / GLYCOLAX) 17 g packet Take 17 g by mouth daily as needed for moderate constipation.     polyvinyl alcohol (LIQUIFILM TEARS) 1.4 % ophthalmic solution Place 1 drop into both eyes daily.     potassium chloride (KLOR-CON) 10 MEQ tablet Take 10 mEq by mouth daily.     tamsulosin (FLOMAX) 0.4 MG CAPS capsule Take 0.8 mg by mouth every evening.     thiamine (VITAMIN B-1) 100 MG tablet Take 1 tablet (100 mg total) by mouth daily.     torsemide (DEMADEX) 20 MG tablet Take 2 tablets (40 mg total) by mouth 2 (two) times daily.     traZODone (DESYREL) 50 MG tablet Take 1 tablet (50 mg total) by mouth at bedtime. 30 tablet 1   Vitamin D, Ergocalciferol, (DRISDOL) 1.25 MG (50000 UNIT) CAPS capsule Take 50,000 Units by mouth every 7 (seven) days. (Mondays)     No current facility-administered medications for this visit.    Allergies: Allergies  Allergen Reactions   Ceftriaxone Anaphylaxis   Ace Inhibitors Swelling and Cough   Chlorhexidine    Haloperidol And Related     Do NOT give anti-psychotics due to risk of  Torsades and QT prolongation (per Dr. Teena Irani request)   Other Itching    [name]   Shellfish Allergy     Listed on MAR   Penicillins Itching and Rash    Tolerated ancef (12-17-22)    Past Medical History:  Diagnosis Date   Anemia    Arthritis    CAD (coronary artery disease)    a. s/p CABG in 03/2020 with LIMA-LAD, RIMA-PL, RA-D1-RI-OM1   Cancer (HCC)    rectal   Cellulitis    COPD (chronic obstructive pulmonary disease) (HCC)    Depression    Diabetes mellitus without complication (HCC)    GERD (gastroesophageal reflux disease)    History of kidney stones    Hyperlipidemia 12/01/2019   Hypertension    Hypothyroidism    Ischemic cardiomyopathy    a. EF 20-25% by echo in 02/2020 b. at 40% by echo in 03/2020 c. EF normalized to 60-65% by echo in 04/2020   Myocardial  infarction [facility]    Peripheral vascular disease (HCC)    Sleep apnea    Type 2 diabetes mellitus (HCC)    Past Surgical History:  Procedure Laterality Date   AMPUTATION Right 10/05/2022   Procedure: AMPUTATION BELOW KNEE;  Surgeon: Nadara Mustard, MD;  Location[facility] OR;  Service: Orthopedics;  Laterality: Right;   AMPUTATION Right 11/14/2022   Procedure: AMPUTATION BELOW KNEE REVISION; WASHOUT;  Surgeon: Renford Dills, MD;  Location: [facility] ORS;  Service: Vascular;  Laterality: Right;   AMPUTATION  Right 11/18/2022   Procedure: AMPUTATION BELOW KNEE REVISION AND CLOSURE;  Surgeon: Renford Dills, MD;  Location: [facility] ORS;  Service: Vascular;  Laterality: Right;   APPLICATION OF WOUND VAC Right 10/05/2022   Procedure: APPLICATION OF WOUND VAC;  Surgeon: Nadara Mustard, MD;  Location: [facility] OR;  Service: Orthopedics;  Laterality: Right;   BIOPSY  07/18/2021   Procedure: BIOPSY;  Surgeon: Lanelle Bal, DO;  Location: [facility];  Service: Endoscopy;;   BIOPSY  01/09/2022   Procedure: BIOPSY;  Surgeon: Lanelle Bal, DO;  [facility]: [facility];  Service: Endoscopy;;   BIOPSY  06/29/2023   Procedure: BIOPSY;  Surgeon: Lanelle Bal, DO;  [facility];  Service: Endoscopy;;   COLONOSCOPY WITH PROPOFOL N/A 07/18/2021   Carver: 15 millimeter polyp removed from the sigmoid colon, 5 mm polyp removed from sigmoid colon.  Nonbleeding internal hemorrhoids.  Significant looping of the colon. sigmoid path showed invasive colonic adenocarcinoma involving tubular adenoma (invades to depth of 2mm, carcinoma 1mm from margin, no lymphovascular invasion, no poorly differentiated component.   COLONOSCOPY WITH PROPOFOL N/A 06/29/2023   Procedure: COLONOSCOPY WITH PROPOFOL;  Surgeon: Lanelle Bal, DO;  [facility];  Service: Endoscopy;  Laterality: N/A;  7:30 am, asa 3   CORONARY ARTERY BYPASS GRAFT N/A 03/13/2020   Procedure: CORONARY ARTERY BYPASS GRAFTING (CABG) times five  using bilateral Internal mammary arteries and left radial artery;  Surgeon: Linden Dolin, MD;  Location: [facility] OR;  Service: Open Heart Surgery;  Laterality: N/A;   DENTAL SURGERY     ESOPHAGOGASTRODUODENOSCOPY (EGD) WITH PROPOFOL N/A 07/18/2021   Carver: 1 gastric polyp status post biopsy, gastritis. gastric bx with slight chronic inflammation and no H.pyori. GEJ polypectomy with mild inflammation only   FLEXIBLE SIGMOIDOSCOPY N/A 08/26/2021   Carver: Nonbleeding internal hemorrhoids.  15 mm ulcers from previous polypectomy found in the rectum.  No evidence of previous polyp.  Located 5 to 8 cm from anal verge.   FLEXIBLE SIGMOIDOSCOPY N/A 01/09/2022   Procedure: FLEXIBLE SIGMOIDOSCOPY;  Surgeon: Lanelle Bal, DO;  [facility];  Service: Endoscopy;  Laterality: N/A;   POLYPECTOMY  07/18/2021   Procedure: POLYPECTOMY INTESTINAL;  Surgeon: Lanelle Bal, DO;  [facility];  Service: Endoscopy;;   POLYPECTOMY  06/29/2023   Procedure: POLYPECTOMY INTESTINAL;  Surgeon: Lanelle Bal, DO;  [facility];  Service: Endoscopy;;   RADIAL ARTERY HARVEST Left 03/13/2020   Procedure: RADIAL ARTERY HARVEST,;  Surgeon: Linden Dolin, MD;  Location: [facility] OR;  Service: Open Heart Surgery;  Laterality: Left;   RIGHT/LEFT HEART CATH AND CORONARY ANGIOGRAPHY N/A 03/07/2020   Procedure: RIGHT/LEFT HEART CATH AND CORONARY ANGIOGRAPHY;  Surgeon: Tonny Bollman, MD;  Location: [facility] INVASIVE CV LAB;  Service: Cardiovascular;  Laterality: N/A;   STUMP REVISION Right 12/17/2022   Procedure: RIGHT BELOW KNEE AMPUTATION REVISION;  Surgeon: Nadara Mustard, MD;  Location: [facility] OR;  Service: Orthopedics;  Laterality: Right;   SUBMUCOSAL LIFTING INJECTION  01/09/2022   Procedure: SUBMUCOSAL LIFTING INJECTION;  Surgeon: Lanelle Bal, DO;  [facility];  Service: Endoscopy;;   SUBMUCOSAL TATTOO INJECTION  01/09/2022   Procedure: SUBMUCOSAL TATTOO INJECTION;  Surgeon: Lanelle Bal, DO;  [facility];  Service: Endoscopy;;   TEE WITHOUT CARDIOVERSION N/A 03/13/2020   Procedure: TRANSESOPHAGEAL ECHOCARDIOGRAM (TEE);  Surgeon: Linden Dolin, MD;  Location: [facility] OR;  Service: Open Heart  Surgery;  Laterality: N/A;   Family History  Problem Relation Age of Onset   Hypertension Mother    Social History   Socioeconomic History   Marital status: Single    Spouse name: Not on file   Number of children: Not on file   Years of education: Not on file   Highest education level: Not on file  Occupational History   Not on file  Tobacco Use   Smoking status: Former    Current packs/day: 0.00    Types: Cigarettes    Quit date: 05/25/1995    Years since quitting: 28.7   Smokeless tobacco: Never  Vaping Use   Vaping status: Never Used  Substance and Sexual Activity   Alcohol use: Not Currently    Comment: rarely   Drug use: No   Sexual activity: Not Currently  Other Topics Concern   Not on file  Social History Narrative   Not on file   Social Drivers of Health   Financial Resource Strain: Not on file  Food Insecurity: No Food Insecurity (10/24/2023)   Hunger Vital Sign    Worried About Running Out of Food in the Last Year: Never true    Ran Out of Food in the Last Year: Never true  Transportation Needs: No Transportation Needs (10/24/2023)   PRAPARE - Administrator, Civil Service (Medical): No    Lack of Transportation (Non-Medical): No  Physical Activity: Not on file  Stress: Not on file  Social Connections: Not on file  Intimate Partner Violence: Not At Risk (10/24/2023)   Humiliation, Afraid, Rape, and Kick questionnaire    Fear of Current or Ex-Partner: No    Emotionally Abused: No    Physically Abused: No    Sexually Abused: No    Review of Systems Constitutional: Patient denies any unintentional weight loss or change in strength lntegumentary: Patient denies any rashes or pruritus Cardiovascular: Patient denies  chest pain or syncope Respiratory: Patient denies shortness of breath Musculoskeletal: Patient denies muscle cramps or weakness Neurologic: Patient denies convulsions or seizures Allergic/Immunologic: Patient denies recent allergic reaction(s) Hematologic/Lymphatic: Patient denies bleeding tendencies Endocrine: Patient denies heat/cold intolerance  GU: As per HPI.  OBJECTIVE Vitals:   02/16/24 1109  BP: 128/72  Pulse: (!) 50   There is no height or weight on file to calculate BMI.  Physical Examination Constitutional: No obvious distress; patient is non-toxic appearing  Cardiovascular: No visible lower extremity edema.  Respiratory: The patient does not have audible wheezing/stridor; respirations do not appear labored  Gastrointestinal: Abdomen non-distended Musculoskeletal: Normal ROM of UEs  Skin: No obvious rashes/open sores  Neurologic: CN 2-12 grossly intact Psychiatric: Answered questions appropriately with normal affect  Hematologic/Lymphatic/Immunologic: No obvious bruises or sites of spontaneous bleeding  ASSESSMENT Neurogenic bladder - Plan: INSERT,TEMP INDWELLING BLAD CATH,SIMPLE, CT GUIDED SUPRAPUBIC CATHETER PLMT  Foley catheter in place - Plan: INSERT,TEMP INDWELLING BLAD CATH,SIMPLE, CT GUIDED SUPRAPUBIC CATHETER PLMT  Chronic retention of urine - Plan: CT GUIDED SUPRAPUBIC CATHETER PLMT  We discussed likely neurogenic bladder with chronic urinary retention based on his risk factors including poorly T2DM with neuropathy and nicotine use.   He is not a viable candidate for clean intermittent catheterization (CIC) due to his ambulatory dysfunction and other comorbidities.  We reviewed options to address chronic urinary retention including ongoing use of indwelling Foley catheter versus suprapubic (SP) tube/catheter placement.  - One significant disadvantage with long term use of an indwelling Foley catheter is the risk for  erosion of the urethral sphincter, which  may result in irreversible gross urinary incontinence over time. - SP tube is placed under light sedation by Interventional Radiology. SP tube stays in place continuously. The insertion site will close if catheter is removed for more than a few hours (and is therefore a reversible intervention). Surgical risks may include but are not limited to: surgical site pain, wound infection, anesthesia risk, symptomatic UTI. SP tube may cause local skin irritation / breakdown, however this is generally preventable by keeping area clean and dry. - Both indwelling Foley catheter and SP tube need to be exchanged every 4 weeks by a nurse (either in office or via home health) to prevent catheter from becoming clogged with sediment.  - With any form of chronic catheter use their urine will always appear infected on UA due to colonization, which is called asymptomatic bacteriuria. Patient was informed that we would not advise antibiotic treatment for a positive UA or positive urine culture in a patient using catheters unless systemic symptoms suggesting UTI are present (such as new onset or worsening of fever, rigors, altered mental status, malaise, or lethargy with no other identified cause; flank pain; CVA tenderness).   Ultimately he elected to proceed with SP tube placement  Patient verbalized understanding of and agreement with current plan. All questions were answered.  PLAN Advised the following: 1. Return in about 4 weeks (around 03/15/2024) for nurse visit for Foley catheter exchange. 2. Order placed for suprapubic (SP) tube/catheter placement by Interventional Radiology.    Orders Placed This Encounter  Procedures   CT GUIDED SUPRAPUBIC CATHETER PLMT    Standing Status:   Future    Expiration Date:   02/15/2025    If indicated for the ordered procedure, I authorize the administration of contrast media per Radiology protocol:   Yes    Reason for Exam (SYMPTOM  OR DIAGNOSIS REQUIRED):   urinary retention     Preferred imaging location?:   [facility]   INSERT,TEMP INDWELLING BLAD CATH,SIMPLE    It has been explained that the patient is to follow regularly with their PCP in addition to all other providers involved in their care and to follow instructions provided by these respective offices. Patient advised to contact urology clinic if any urologic-pertaining questions, concerns, new symptoms or problems arise in the interim period.  Patient Instructions  We discussed option to consider suprapubic (SP) tube placement. We reviewed that this is done via an outpatient surgical procedure performed by Interventional Radiology under sedation. We discussed reasons why SP tube is preferred over long term use of indwelling urethral catheters since urethral catheters can cause urethral sphincter erosion which may result in gross urinary incontinence over time. We discussed risks such as surgical site pain, infections, skin irritation / breakdown, chronic bacteriuria, symptomatic UTls. Patient was advised that SP tube would stay in continuously and that the insertion site will close if catheter is removed for more than a few hours. Discussed that the SP tube would need to be exchanged routinely every 4 weeks to prevent catheter from becoming clogged with sediment and that SP tube exchanges are typically performed at a urology nurse visit or by a home health nurse. We discussed that the SP tube can be removed in the future if patient elects to discontinue that intervention. Pt elected to proceed with SP tube placement; order submitted to Interventional Radiology.    Electronically signed by:  Donnita Falls, FNP   02/16/24  12:01 PM

## 2024-02-16 ENCOUNTER — Ambulatory Visit (INDEPENDENT_AMBULATORY_CARE_PROVIDER_SITE_OTHER): Admitting: Urology

## 2024-02-16 ENCOUNTER — Encounter: Payer: Self-pay | Admitting: Urology

## 2024-02-16 VITALS — BP 128/72 | HR 50

## 2024-02-16 DIAGNOSIS — Z978 Presence of other specified devices: Secondary | ICD-10-CM

## 2024-02-16 DIAGNOSIS — R339 Retention of urine, unspecified: Secondary | ICD-10-CM | POA: Diagnosis not present

## 2024-02-16 DIAGNOSIS — N319 Neuromuscular dysfunction of bladder, unspecified: Secondary | ICD-10-CM

## 2024-02-16 NOTE — Patient Instructions (Signed)
 We discussed option to consider suprapubic (SP) tube placement. We reviewed that this is done via an outpatient surgical procedure performed by Interventional Radiology under sedation. We discussed reasons why SP tube is preferred over long term use of indwelling urethral catheters since urethral catheters can cause urethral sphincter erosion which may result in gross urinary incontinence over time. We discussed risks such as surgical site pain, infections, skin irritation / breakdown, chronic bacteriuria, symptomatic UTls. Patient was advised that SP tube would stay in continuously and that the insertion site will close if catheter is removed for more than a few hours. Discussed that the SP tube would need to be exchanged routinely every 4 weeks to prevent catheter from becoming clogged with sediment and that SP tube exchanges are typically performed at a urology nurse visit or by a home health nurse. We discussed that the SP tube can be removed in the future if patient elects to discontinue that intervention. Pt elected to proceed with SP tube placement; order submitted to Interventional Radiology.

## 2024-02-16 NOTE — Progress Notes (Signed)
 Cath Change/ Replacement  Patient is present today for a catheter change due to urinary retention.  12 ml of water was removed from the balloon, a 16 FR foley cath was removed without difficulty.  Patient was cleaned and prepped in a sterile fashion with [id number].  A 16 FR foley cath was replaced into the bladder, no complications were noted. Urine return was noted 15 ml and urine was Clear yellow in color. The balloon was filled with 10ml of sterile water. A night bag was attached for drainage.  A night bag was also given to the patient and patient was given instruction on how to change from one bag to another. Patient was given proper instruction on catheter care.    Performed by: Kennyth Lose, CMA  Follow up: 4 weeks

## 2024-02-19 ENCOUNTER — Inpatient Hospital Stay (HOSPITAL_COMMUNITY)
Admission: EM | Admit: 2024-02-19 | Discharge: 2024-02-25 | DRG: 511 | Disposition: A | Discharge status: Skilled Nursing Facility | Source: Skilled Nursing Facility | Attending: Internal Medicine | Admitting: Internal Medicine

## 2024-02-19 ENCOUNTER — Other Ambulatory Visit: Payer: Self-pay

## 2024-02-19 ENCOUNTER — Encounter (HOSPITAL_COMMUNITY): Payer: Self-pay

## 2024-02-19 ENCOUNTER — Emergency Department (HOSPITAL_COMMUNITY)

## 2024-02-19 DIAGNOSIS — Z85048 Personal history of other malignant neoplasm of rectum, rectosigmoid junction, and anus: Secondary | ICD-10-CM

## 2024-02-19 DIAGNOSIS — N179 Acute kidney failure, unspecified: Secondary | ICD-10-CM | POA: Diagnosis present

## 2024-02-19 DIAGNOSIS — F32A Depression, unspecified: Secondary | ICD-10-CM | POA: Diagnosis present

## 2024-02-19 DIAGNOSIS — N1831 Chronic kidney disease, stage 3a: Secondary | ICD-10-CM | POA: Diagnosis present

## 2024-02-19 DIAGNOSIS — N400 Enlarged prostate without lower urinary tract symptoms: Secondary | ICD-10-CM | POA: Diagnosis present

## 2024-02-19 DIAGNOSIS — D631 Anemia in chronic kidney disease: Secondary | ICD-10-CM | POA: Diagnosis present

## 2024-02-19 DIAGNOSIS — R9431 Abnormal electrocardiogram [ECG] [EKG]: Secondary | ICD-10-CM | POA: Diagnosis present

## 2024-02-19 DIAGNOSIS — R7982 Elevated C-reactive protein (CRP): Secondary | ICD-10-CM | POA: Diagnosis present

## 2024-02-19 DIAGNOSIS — Z888 Allergy status to other drugs, medicaments and biological substances status: Secondary | ICD-10-CM

## 2024-02-19 DIAGNOSIS — I350 Nonrheumatic aortic (valve) stenosis: Secondary | ICD-10-CM

## 2024-02-19 DIAGNOSIS — Z91013 Allergy to seafood: Secondary | ICD-10-CM

## 2024-02-19 DIAGNOSIS — M009 Pyogenic arthritis, unspecified: Principal | ICD-10-CM | POA: Diagnosis present

## 2024-02-19 DIAGNOSIS — Z85038 Personal history of other malignant neoplasm of large intestine: Secondary | ICD-10-CM

## 2024-02-19 DIAGNOSIS — Z794 Long term (current) use of insulin: Secondary | ICD-10-CM

## 2024-02-19 DIAGNOSIS — Z87442 Personal history of urinary calculi: Secondary | ICD-10-CM

## 2024-02-19 DIAGNOSIS — I255 Ischemic cardiomyopathy: Secondary | ICD-10-CM | POA: Diagnosis present

## 2024-02-19 DIAGNOSIS — N319 Neuromuscular dysfunction of bladder, unspecified: Secondary | ICD-10-CM | POA: Diagnosis present

## 2024-02-19 DIAGNOSIS — E1151 Type 2 diabetes mellitus with diabetic peripheral angiopathy without gangrene: Secondary | ICD-10-CM | POA: Diagnosis present

## 2024-02-19 DIAGNOSIS — I252 Old myocardial infarction: Secondary | ICD-10-CM

## 2024-02-19 DIAGNOSIS — Z881 Allergy status to other antibiotic agents status: Secondary | ICD-10-CM

## 2024-02-19 DIAGNOSIS — I2510 Atherosclerotic heart disease of native coronary artery without angina pectoris: Secondary | ICD-10-CM | POA: Diagnosis present

## 2024-02-19 DIAGNOSIS — C187 Malignant neoplasm of sigmoid colon: Secondary | ICD-10-CM | POA: Diagnosis present

## 2024-02-19 DIAGNOSIS — Z79890 Hormone replacement therapy (postmenopausal): Secondary | ICD-10-CM

## 2024-02-19 DIAGNOSIS — N184 Chronic kidney disease, stage 4 (severe): Secondary | ICD-10-CM | POA: Diagnosis present

## 2024-02-19 DIAGNOSIS — K219 Gastro-esophageal reflux disease without esophagitis: Secondary | ICD-10-CM | POA: Diagnosis present

## 2024-02-19 DIAGNOSIS — Z87891 Personal history of nicotine dependence: Secondary | ICD-10-CM

## 2024-02-19 DIAGNOSIS — Z89511 Acquired absence of right leg below knee: Secondary | ICD-10-CM

## 2024-02-19 DIAGNOSIS — E66811 Obesity, class 1: Secondary | ICD-10-CM | POA: Diagnosis present

## 2024-02-19 DIAGNOSIS — I10 Essential (primary) hypertension: Secondary | ICD-10-CM | POA: Diagnosis present

## 2024-02-19 DIAGNOSIS — Z79899 Other long term (current) drug therapy: Secondary | ICD-10-CM

## 2024-02-19 DIAGNOSIS — E1122 Type 2 diabetes mellitus with diabetic chronic kidney disease: Secondary | ICD-10-CM | POA: Diagnosis present

## 2024-02-19 DIAGNOSIS — J449 Chronic obstructive pulmonary disease, unspecified: Secondary | ICD-10-CM | POA: Diagnosis present

## 2024-02-19 DIAGNOSIS — Z6833 Body mass index (BMI) 33.0-33.9, adult: Secondary | ICD-10-CM

## 2024-02-19 DIAGNOSIS — R1310 Dysphagia, unspecified: Secondary | ICD-10-CM | POA: Diagnosis not present

## 2024-02-19 DIAGNOSIS — G4733 Obstructive sleep apnea (adult) (pediatric): Secondary | ICD-10-CM | POA: Diagnosis present

## 2024-02-19 DIAGNOSIS — Z880 Allergy status to penicillin: Secondary | ICD-10-CM

## 2024-02-19 DIAGNOSIS — E785 Hyperlipidemia, unspecified: Secondary | ICD-10-CM | POA: Diagnosis present

## 2024-02-19 DIAGNOSIS — Z7982 Long term (current) use of aspirin: Secondary | ICD-10-CM

## 2024-02-19 DIAGNOSIS — E1129 Type 2 diabetes mellitus with other diabetic kidney complication: Principal | ICD-10-CM | POA: Diagnosis present

## 2024-02-19 DIAGNOSIS — Z951 Presence of aortocoronary bypass graft: Secondary | ICD-10-CM

## 2024-02-19 DIAGNOSIS — I130 Hypertensive heart and chronic kidney disease with heart failure and stage 1 through stage 4 chronic kidney disease, or unspecified chronic kidney disease: Secondary | ICD-10-CM | POA: Diagnosis present

## 2024-02-19 DIAGNOSIS — Z860100 Personal history of colon polyps, unspecified: Secondary | ICD-10-CM

## 2024-02-19 DIAGNOSIS — R21 Rash and other nonspecific skin eruption: Secondary | ICD-10-CM | POA: Diagnosis present

## 2024-02-19 DIAGNOSIS — E039 Hypothyroidism, unspecified: Secondary | ICD-10-CM | POA: Diagnosis present

## 2024-02-19 DIAGNOSIS — Z8249 Family history of ischemic heart disease and other diseases of the circulatory system: Secondary | ICD-10-CM

## 2024-02-19 DIAGNOSIS — T361X5A Adverse effect of cephalosporins and other beta-lactam antibiotics, initial encounter: Secondary | ICD-10-CM | POA: Diagnosis present

## 2024-02-19 DIAGNOSIS — I5032 Chronic diastolic (congestive) heart failure: Secondary | ICD-10-CM | POA: Diagnosis present

## 2024-02-19 DIAGNOSIS — Z7951 Long term (current) use of inhaled steroids: Secondary | ICD-10-CM

## 2024-02-19 LAB — CBC WITH DIFFERENTIAL/PLATELET
Abs Immature Granulocytes: 0.09 10*3/uL — ABNORMAL HIGH (ref 0.00–0.07)
Basophils Absolute: 0 10*3/uL (ref 0.0–0.1)
Basophils Relative: 0 %
Eosinophils Absolute: 0.4 10*3/uL (ref 0.0–0.5)
Eosinophils Relative: 3 %
HCT: 31.1 % — ABNORMAL LOW (ref 39.0–52.0)
Hemoglobin: 10.3 g/dL — ABNORMAL LOW (ref 13.0–17.0)
Immature Granulocytes: 1 %
Lymphocytes Relative: 8 %
Lymphs Abs: 1 10*3/uL (ref 0.7–4.0)
MCH: 29.4 pg (ref 26.0–34.0)
MCHC: 33.1 g/dL (ref 30.0–36.0)
MCV: 88.9 fL (ref 80.0–100.0)
Monocytes Absolute: 1.3 10*3/uL — ABNORMAL HIGH (ref 0.1–1.0)
Monocytes Relative: 11 %
Neutro Abs: 9.1 10*3/uL — ABNORMAL HIGH (ref 1.7–7.7)
Neutrophils Relative %: 77 %
Platelets: 213 10*3/uL (ref 150–400)
RBC: 3.5 MIL/uL — ABNORMAL LOW (ref 4.22–5.81)
RDW: 13.1 % (ref 11.5–15.5)
WBC: 11.8 10*3/uL — ABNORMAL HIGH (ref 4.0–10.5)
nRBC: 0 % (ref 0.0–0.2)

## 2024-02-19 LAB — BASIC METABOLIC PANEL WITH GFR
Anion gap: 15 (ref 5–15)
BUN: 73 mg/dL — ABNORMAL HIGH (ref 8–23)
CO2: 23 mmol/L (ref 22–32)
Calcium: 7.9 mg/dL — ABNORMAL LOW (ref 8.9–10.3)
Chloride: 97 mmol/L — ABNORMAL LOW (ref 98–111)
Creatinine, Ser: 2.44 mg/dL — ABNORMAL HIGH (ref 0.61–1.24)
GFR, Estimated: 29 mL/min — ABNORMAL LOW (ref >60)
Glucose, Bld: 140 mg/dL — ABNORMAL HIGH (ref 70–99)
Potassium: 4 mmol/L (ref 3.5–5.1)
Sodium: 135 mmol/L (ref 135–145)

## 2024-02-19 MED ORDER — OXYCODONE-ACETAMINOPHEN 5-325 MG PO TABS
1.0000 | ORAL_TABLET | Freq: Once | ORAL | Status: AC
  Administered 2024-02-19: 1 via ORAL
  Filled 2024-02-19: qty 1

## 2024-02-19 NOTE — ED Triage Notes (Signed)
 Pt to ED via [facility] from [facility] to be evaluated for left arm pain ongoing all day. Denies injury. States pain radiates from elbow, up to shoulder, into neck and down right shoulder. On 3L [place] chronic.

## 2024-02-19 NOTE — ED Provider Notes (Signed)
  EMERGENCY DEPARTMENT AT [facility] Provider Note   CSN: [id number] Arrival date & time: 02/19/24  2214     History  Chief Complaint  Patient presents with   Arm Pain    Shane Chambers is a 63 y.o. male.  HPI     This is a 63 year old male with a history of hypertension, diabetes, ischemic cardiomyopathy, COPD who presents with left arm pain.  Patient reports onset of pain approximately 24 hours ago.  He reports pain mostly in the left elbow that radiates into his arm.  Denies any injury.  States that he has difficulty ranging his elbow secondary to pain.  Has not had any fevers.  Patient is right-handed.  Home Medications Prior to Admission medications   Medication Sig Start Date End Date Taking? Authorizing Provider  acetaminophen (TYLENOL) 500 MG tablet Take 1,000 mg by mouth every 8 (eight) hours as needed for fever or moderate pain.    [provider]  albuterol (VENTOLIN HFA) 108 (90 Base) MCG/ACT inhaler Inhale 2 puffs into the lungs every 6 (six) hours as needed for wheezing or shortness of breath. 05/02/22   Rayfield Cairo, MD  aspirin 81 MG EC tablet Take 1 tablet (81 mg total) by mouth daily with breakfast. 11/29/20   Quintella Buck, Courage, MD  atorvastatin (LIPITOR) 40 MG tablet Take 1 tablet (40 mg total) by mouth at bedtime. 11/28/22   Althia Atlas, MD  bisacodyl 5 MG EC tablet Take 1 tablet (5 mg total) by mouth daily as needed for moderate constipation. 05/28/23   Vinetta Greening, DO  budesonide-formoterol [facility]) 160-4.5 MCG/ACT inhaler Inhale 2 puffs into the lungs in the morning and at bedtime. 05/24/20   Lind Repine, MD  carvedilol (COREG) 25 MG tablet Take 1 tablet (25 mg total) by mouth 2 (two) times daily with a meal. 04/02/23   Ozell Blunt, MD  cetirizine (ZYRTEC) 10 MG chewable tablet Chew 10 mg by mouth daily.    [provider]  ferrous gluconate (FERGON) 324 MG tablet Take 324 mg by mouth daily.    [provider]  gabapentin (NEURONTIN) 300 MG capsule Take 300 mg by mouth 2 (two) times daily. 10/13/23   [provider]  hydrALAZINE (APRESOLINE) 100 MG tablet Take 1 tablet (100 mg total) by mouth 2 (two) times daily. 10/31/23   Justina Oman, MD  hydrOXYzine (ATARAX) 25 MG tablet Take 1 tablet (25 mg total) by mouth 3 (three) times daily as needed for anxiety. 10/31/23   Justina Oman, MD  insulin aspart (NOVOLOG FLEXPEN) 100 UNIT/ML FlexPen 0-15 Units, Subcutaneous, 3 times daily with meals CBG < 70: implement hypoglycemia protocol-call MD CBG 70 - 120: 0 units CBG 121 - 150: 2 units CBG 151 - 200: 3 units CBG 201 - 250: 5 units CBG 251 - 300: 8 units CBG 301 - 350: 11 units CBG 351 - 400: 15 units CBG > 400: 10/17/22   Ghimire, Estil Heman, MD  ipratropium-albuterol (DUONEB) 0.5-2.5 (3) MG/3ML SOLN Take 3 mLs by nebulization every 6 (six) hours as needed. Patient taking differently: Take 3 mLs by nebulization every 6 (six) hours as needed (SOB/wheezing). 10/17/22   Ghimire, Estil Heman, MD  levothyroxine (SYNTHROID) 75 MCG tablet Take 75 mcg by mouth daily before breakfast.    [provider]  lubiprostone (AMITIZA) 8 MCG capsule Take 1 capsule (8 mcg total) by mouth 2 (two) times daily with a meal. 09/29/23  Delman Ferns, NP  Luliconazole 1 % CREA Apply 1 Application topically in the morning and at bedtime. To sacrum for moisture-related rash 10/21/23   [provider]  metolazone (ZAROXOLYN) 2.5 MG tablet Take 1 tablet (2.5 mg total) by mouth once a week. On Wednesdays 10/31/23   Justina Oman, MD  omeprazole (PRILOSEC) 40 MG capsule Take 40 mg by mouth in the morning. 01/01/22   [provider]  ondansetron (ZOFRAN-ODT) 4 MG disintegrating tablet Take 1 tablet (4 mg total) by mouth every 8 (eight) hours as needed for nausea or vomiting. 01/07/24   Carin Charleston, MD  polyethylene glycol (MIRALAX / GLYCOLAX) 17 g packet Take 17 g by mouth daily as needed  for moderate constipation.    [provider]  polyvinyl alcohol (LIQUIFILM TEARS) 1.4 % ophthalmic solution Place 1 drop into both eyes daily.    [provider]  potassium chloride (KLOR-CON) 10 MEQ tablet Take 10 mEq by mouth daily. 10/13/23   [provider]  tamsulosin (FLOMAX) 0.4 MG CAPS capsule Take 0.8 mg by mouth every evening.    [provider]  thiamine (VITAMIN B-1) 100 MG tablet Take 1 tablet (100 mg total) by mouth daily. 10/18/22   Ghimire, Estil Heman, MD  torsemide (DEMADEX) 20 MG tablet Take 2 tablets (40 mg total) by mouth 2 (two) times daily. 10/31/23   Justina Oman, MD  traZODone (DESYREL) 50 MG tablet Take 1 tablet (50 mg total) by mouth at bedtime. 10/31/23   Justina Oman, MD  Vitamin D, Ergocalciferol, (DRISDOL) 1.25 MG (50000 UNIT) CAPS capsule Take 50,000 Units by mouth every 7 (seven) days. (Mondays)    [provider]      Allergies    Ceftriaxone, Ace inhibitors, Chlorhexidine, Haloperidol and related, Other, Shellfish allergy, and Penicillins    Review of Systems   Review of Systems  Respiratory:  Negative for shortness of breath.   Cardiovascular:  Negative for chest pain.  Musculoskeletal:        Arm pain  All other systems reviewed and are negative.   Physical Exam Updated Vital Signs BP 131/71   Pulse 65   Temp 98.9 F (37.2 C) (Oral)   Resp 16   Ht 1.778 m (5\' 10" )   Wt 106.1 kg   SpO2 99%   BMI 33.58 kg/m  Physical Exam Vitals and nursing note reviewed.  Constitutional:      Appearance: He is well-developed.     Comments: Chronically ill-appearing  HENT:     Head: Normocephalic and atraumatic.  Eyes:     Pupils: Pupils are equal, round, and reactive to light.  Cardiovascular:     Rate and Rhythm: Normal rate and regular rhythm.     Heart sounds: Normal heart sounds. No murmur heard. Pulmonary:     Effort: Pulmonary effort is normal. No respiratory distress.     Breath sounds: No  wheezing.  Abdominal:     Palpations: Abdomen is soft.     Tenderness: There is no abdominal tenderness. There is no rebound.  Musculoskeletal:     Cervical back: Neck supple.     Comments: Left arm held in a flexed position, tenderness to palpation over the olecranon, no overlying skin changes or swelling noted, 2+ radial pulse distally, pain with passive and active range of motion  Lymphadenopathy:     Cervical: No cervical adenopathy.  Skin:    General: Skin is warm and dry.  Neurological:  Mental Status: He is alert and oriented to person, place, and time.     ED Results / Procedures / Treatments   Labs (all labs ordered are listed, but only abnormal results are displayed) Labs Reviewed  CBC WITH DIFFERENTIAL/PLATELET - Abnormal; Notable for the following components:      Result Value   WBC 11.8 (*)    RBC 3.50 (*)    Hemoglobin 10.3 (*)    HCT 31.1 (*)    Neutro Abs 9.1 (*)    Monocytes Absolute 1.3 (*)    Abs Immature Granulocytes 0.09 (*)    All other components within normal limits  BASIC METABOLIC PANEL WITH GFR - Abnormal; Notable for the following components:   Chloride 97 (*)    Glucose, Bld 140 (*)    BUN 73 (*)    Creatinine, Ser 2.44 (*)    Calcium 7.9 (*)    GFR, Estimated 29 (*)    All other components within normal limits  SEDIMENTATION RATE - Abnormal; Notable for the following components:   Sed Rate 55 (*)    All other components within normal limits  SYNOVIAL CELL COUNT + DIFF, W/ CRYSTALS - Abnormal; Notable for the following components:   Appearance-Synovial TURBID (*)    WBC, Synovial 50,300 (*)    Neutrophil, Synovial 93 (*)    Monocyte-Macrophage-Synovial Fluid 7 (*)    All other components within normal limits  BODY FLUID CULTURE W GRAM STAIN  C-REACTIVE PROTEIN    EKG None  Radiology DG Elbow Complete Left Result Date: 02/20/2024 CLINICAL DATA:  Pain EXAM: LEFT ELBOW - COMPLETE 3+ VIEW COMPARISON:  None Available. FINDINGS: Mild  spurring in the elbow joint. No acute bony abnormality. Specifically, no fracture, subluxation, or dislocation. Left elbow joint effusion. IMPRESSION: Degenerative changes with elbow joint effusion. No visible fracture. If there is clinical concern for occult fracture, consider immobilization and repeat imaging in 1 week if symptoms persist. Electronically Signed   By: Janeece Mechanic M.D.   On: 02/20/2024 00:05    Procedures .Joint Aspiration/Arthrocentesis  Date/Time: 02/20/2024 3:04 AM  Performed by: Rory Collard, MD Authorized by: Rory Collard, MD   Consent:    Consent obtained:  Written   Consent given by:  Patient   Risks, benefits, and alternatives were discussed: yes     Risks discussed:  Bleeding, infection and pain   Alternatives discussed:  No treatment Universal protocol:    Patient identity confirmed:  Verbally with patient Location:    Location:  Elbow   Elbow:  L elbow Anesthesia:    Anesthesia method:  Local infiltration   Local anesthetic:  Lidocaine 1% w/o epi Procedure details:    Preparation: Patient was prepped and draped in usual sterile fashion     Needle gauge:  18 G   Ultrasound guidance: yes     Approach:  Lateral   Aspirate amount:  2   Aspirate characteristics:  Cloudy and blood-tinged   Steroid injected: no     Specimen collected: yes   Post-procedure details:    Dressing:  Adhesive bandage   Procedure completion:  Tolerated     Medications Ordered in ED Medications  oxyCODONE-acetaminophen (PERCOCET/ROXICET) 5-325 MG per tablet 1 tablet (1 tablet Oral Given 02/19/24 2334)  lidocaine (PF) (XYLOCAINE) 1 % injection 5 mL (5 mLs Other Given 02/20/24 0236)    ED Course/ Medical Decision Making/ A&P Clinical Course as of 02/20/24 0609  Sat Feb 20, 2024  0537  Spoke to Dr. Maryjane Snider, orthopedics.  Will send patient down to [name], ED to ED for possible washout in the OR. [CH]  0607 Patient to be transferred ED to ED, Dr. Martina Sledge accepting.  [CH]    Clinical Course User Index [CH] Michaelyn Wall, Vonzella Guernsey, MD                                 Medical Decision Making Amount and/or Complexity of Data Reviewed Labs: ordered. Radiology: ordered.  Risk Prescription drug management.   This patient presents to the ED for concern of elbow pain, this involves an extensive number of treatment options, and is a complaint that carries with it a high risk of complications and morbidity.  I considered the following differential and admission for this acute, potentially life threatening condition.  The differential diagnosis includes occult injury, arthritis, bursitis, septic arthritis, gout  MDM:    This is a 63 year old male who presents with left elbow pain.  1 day history of left elbow pain.  Exam is fairly impressive as patient has exquisite pain with passive and active range of motion.  He also has tenderness.  There are no significant overlying skin changes.  He denies injury.  He is not ill-appearing.  However, exam would heighten concern for septic arthritis versus inflammatory arthritis.  Labs obtained.  Slight leukocytosis to 11.  Sed rate is 55.  Arthrocentesis performed at the bedside.  Fluid was murky.  Only able to aspirate 2 cc from the joint.  Prioritized cell count and culture with Gram stain.  Cell count with 50,300 white cells with a predominant neutrophil of 93%.  Discussed with orthopedics.  This will be concerning for septic joint.  Recommends washout.  Will transfer ED to ED for orthopedic evaluation.  (Labs, imaging, consults)  Labs: I Ordered, and personally interpreted labs.  The pertinent results include: CBC, BMP, sed rate, CRP, fluid studies  Imaging Studies ordered: I ordered imaging studies including x-ray elbow I independently visualized and interpreted imaging. I agree with the radiologist interpretation  Additional history obtained from chart review.  External records from outside source obtained and reviewed  including prior evaluations  Cardiac Monitoring: The patient was maintained on a cardiac monitor.  If on the cardiac monitor, I personally viewed and interpreted the cardiac monitored which showed an underlying rhythm of: Sinus  Reevaluation: After the interventions noted above, I reevaluated the patient and found that they have :stayed the same  Social Determinants of Health:  lives in living facility  Disposition: Transfer for orthopedic evaluation  Co morbidities that complicate the patient evaluation  Past Medical History:  Diagnosis Date   Anemia    Arthritis    CAD (coronary artery disease)    a. s/p CABG in 03/2020 with LIMA-LAD, RIMA-PL, RA-[id number]-OM1   Cancer (HCC)    rectal   Cellulitis    COPD (chronic obstructive pulmonary disease) (HCC)    Depression    Diabetes mellitus without complication (HCC)    GERD (gastroesophageal reflux disease)    History of kidney stones    Hyperlipidemia 12/01/2019   Hypertension    Hypothyroidism    Ischemic cardiomyopathy    a. EF 20-25% by echo in 02/2020 b. at 40% by echo in 03/2020 c. EF normalized to 60-65% by echo in 04/2020   Myocardial infarction [facility])    Peripheral vascular disease (HCC)    Sleep apnea  Type 2 diabetes mellitus (HCC)      Medicines Meds ordered this encounter  Medications   oxyCODONE-acetaminophen (PERCOCET/ROXICET) 5-325 MG per tablet 1 tablet    Refill:  0   lidocaine (PF) (XYLOCAINE) 1 % injection    Ward, Angus Bark A: cabinet override   lidocaine (PF) (XYLOCAINE) 1 % injection 5 mL    I have reviewed the patients home medicines and have made adjustments as needed  Problem List / ED Course: Problem List Items Addressed This Visit   None Visit Diagnoses       Pyogenic arthritis of left elbow, due to unspecified organism (HCC)    -  Primary   Relevant Medications   oxyCODONE-acetaminophen (PERCOCET/ROXICET) 5-325 MG per tablet 1 tablet (Completed)                   Final  Clinical Impression(s) / ED Diagnoses Final diagnoses:  Pyogenic arthritis of left elbow, due to unspecified organism [facility])    Rx / DC Orders ED Discharge Orders     None         Tylyn Stankovich, Vonzella Guernsey, MD 02/20/24 [phone]

## 2024-02-20 ENCOUNTER — Emergency Department (HOSPITAL_COMMUNITY): Admitting: Anesthesiology

## 2024-02-20 ENCOUNTER — Encounter (HOSPITAL_COMMUNITY)
Admission: EM | Disposition: A | Discharge status: Skilled Nursing Facility | Payer: Self-pay | Source: Skilled Nursing Facility | Attending: Internal Medicine

## 2024-02-20 ENCOUNTER — Encounter (HOSPITAL_COMMUNITY): Payer: Self-pay | Admitting: Orthopedic Surgery

## 2024-02-20 ENCOUNTER — Other Ambulatory Visit: Payer: Self-pay

## 2024-02-20 DIAGNOSIS — M009 Pyogenic arthritis, unspecified: Secondary | ICD-10-CM | POA: Diagnosis present

## 2024-02-20 DIAGNOSIS — N189 Chronic kidney disease, unspecified: Secondary | ICD-10-CM | POA: Diagnosis not present

## 2024-02-20 DIAGNOSIS — N400 Enlarged prostate without lower urinary tract symptoms: Secondary | ICD-10-CM

## 2024-02-20 DIAGNOSIS — E039 Hypothyroidism, unspecified: Secondary | ICD-10-CM

## 2024-02-20 DIAGNOSIS — I350 Nonrheumatic aortic (valve) stenosis: Secondary | ICD-10-CM | POA: Diagnosis present

## 2024-02-20 DIAGNOSIS — I509 Heart failure, unspecified: Secondary | ICD-10-CM

## 2024-02-20 DIAGNOSIS — N1831 Chronic kidney disease, stage 3a: Secondary | ICD-10-CM | POA: Diagnosis not present

## 2024-02-20 DIAGNOSIS — Z794 Long term (current) use of insulin: Secondary | ICD-10-CM | POA: Diagnosis not present

## 2024-02-20 DIAGNOSIS — M00822 Arthritis due to other bacteria, left elbow: Secondary | ICD-10-CM

## 2024-02-20 DIAGNOSIS — E785 Hyperlipidemia, unspecified: Secondary | ICD-10-CM | POA: Diagnosis present

## 2024-02-20 DIAGNOSIS — R1310 Dysphagia, unspecified: Secondary | ICD-10-CM | POA: Diagnosis not present

## 2024-02-20 DIAGNOSIS — R9431 Abnormal electrocardiogram [ECG] [EKG]: Secondary | ICD-10-CM

## 2024-02-20 DIAGNOSIS — I130 Hypertensive heart and chronic kidney disease with heart failure and stage 1 through stage 4 chronic kidney disease, or unspecified chronic kidney disease: Secondary | ICD-10-CM | POA: Diagnosis present

## 2024-02-20 DIAGNOSIS — Z89511 Acquired absence of right leg below knee: Secondary | ICD-10-CM | POA: Diagnosis not present

## 2024-02-20 DIAGNOSIS — J449 Chronic obstructive pulmonary disease, unspecified: Secondary | ICD-10-CM

## 2024-02-20 DIAGNOSIS — K219 Gastro-esophageal reflux disease without esophagitis: Secondary | ICD-10-CM

## 2024-02-20 DIAGNOSIS — Z951 Presence of aortocoronary bypass graft: Secondary | ICD-10-CM | POA: Diagnosis not present

## 2024-02-20 DIAGNOSIS — I110 Hypertensive heart disease with heart failure: Secondary | ICD-10-CM | POA: Diagnosis not present

## 2024-02-20 DIAGNOSIS — G4733 Obstructive sleep apnea (adult) (pediatric): Secondary | ICD-10-CM

## 2024-02-20 DIAGNOSIS — N184 Chronic kidney disease, stage 4 (severe): Secondary | ICD-10-CM | POA: Diagnosis present

## 2024-02-20 DIAGNOSIS — I5032 Chronic diastolic (congestive) heart failure: Secondary | ICD-10-CM | POA: Diagnosis present

## 2024-02-20 DIAGNOSIS — E1151 Type 2 diabetes mellitus with diabetic peripheral angiopathy without gangrene: Secondary | ICD-10-CM | POA: Diagnosis present

## 2024-02-20 DIAGNOSIS — D631 Anemia in chronic kidney disease: Secondary | ICD-10-CM | POA: Diagnosis present

## 2024-02-20 DIAGNOSIS — E1129 Type 2 diabetes mellitus with other diabetic kidney complication: Secondary | ICD-10-CM

## 2024-02-20 DIAGNOSIS — F32A Depression, unspecified: Secondary | ICD-10-CM | POA: Diagnosis present

## 2024-02-20 DIAGNOSIS — N179 Acute kidney failure, unspecified: Secondary | ICD-10-CM | POA: Diagnosis present

## 2024-02-20 DIAGNOSIS — Z87891 Personal history of nicotine dependence: Secondary | ICD-10-CM | POA: Diagnosis not present

## 2024-02-20 DIAGNOSIS — I10 Essential (primary) hypertension: Secondary | ICD-10-CM

## 2024-02-20 DIAGNOSIS — C187 Malignant neoplasm of sigmoid colon: Secondary | ICD-10-CM

## 2024-02-20 DIAGNOSIS — I252 Old myocardial infarction: Secondary | ICD-10-CM | POA: Diagnosis not present

## 2024-02-20 DIAGNOSIS — I2510 Atherosclerotic heart disease of native coronary artery without angina pectoris: Secondary | ICD-10-CM

## 2024-02-20 DIAGNOSIS — E1122 Type 2 diabetes mellitus with diabetic chronic kidney disease: Secondary | ICD-10-CM | POA: Diagnosis present

## 2024-02-20 DIAGNOSIS — Z8249 Family history of ischemic heart disease and other diseases of the circulatory system: Secondary | ICD-10-CM | POA: Diagnosis not present

## 2024-02-20 DIAGNOSIS — F333 Major depressive disorder, recurrent, severe with psychotic symptoms: Secondary | ICD-10-CM

## 2024-02-20 DIAGNOSIS — Z881 Allergy status to other antibiotic agents status: Secondary | ICD-10-CM | POA: Diagnosis not present

## 2024-02-20 LAB — GLUCOSE, CAPILLARY
Glucose-Capillary: 121 mg/dL — ABNORMAL HIGH (ref 70–99)
Glucose-Capillary: 141 mg/dL — ABNORMAL HIGH (ref 70–99)
Glucose-Capillary: 118 mg/dL — ABNORMAL HIGH (ref 70–99)

## 2024-02-20 LAB — COMPREHENSIVE METABOLIC PANEL WITH GFR
ALT: 8 U/L (ref 0–44)
AST: 13 U/L — ABNORMAL LOW (ref 15–41)
Albumin: 3.3 g/dL — ABNORMAL LOW (ref 3.5–5.0)
Alkaline Phosphatase: 72 U/L (ref 38–126)
Anion gap: 12 (ref 5–15)
BUN: 69 mg/dL — ABNORMAL HIGH (ref 8–23)
CO2: 25 mmol/L (ref 22–32)
Calcium: 7.6 mg/dL — ABNORMAL LOW (ref 8.9–10.3)
Chloride: 99 mmol/L (ref 98–111)
Creatinine, Ser: 2.44 mg/dL — ABNORMAL HIGH (ref 0.61–1.24)
GFR, Estimated: 29 mL/min — ABNORMAL LOW (ref >60)
Glucose, Bld: 122 mg/dL — ABNORMAL HIGH (ref 70–99)
Potassium: 3.6 mmol/L (ref 3.5–5.1)
Sodium: 136 mmol/L (ref 135–145)
Total Bilirubin: 1 mg/dL (ref 0.0–1.2)
Total Protein: 6.8 g/dL (ref 6.5–8.1)

## 2024-02-20 LAB — C-REACTIVE PROTEIN: CRP: 2.8 mg/dL — ABNORMAL HIGH (ref <1.0)

## 2024-02-20 LAB — CBC
HCT: 32.5 % — ABNORMAL LOW (ref 39.0–52.0)
Hemoglobin: 10.8 g/dL — ABNORMAL LOW (ref 13.0–17.0)
MCH: 29.7 pg (ref 26.0–34.0)
MCHC: 33.2 g/dL (ref 30.0–36.0)
MCV: 89.3 fL (ref 80.0–100.0)
Platelets: 241 10*3/uL (ref 150–400)
RBC: 3.64 MIL/uL — ABNORMAL LOW (ref 4.22–5.81)
RDW: 13.2 % (ref 11.5–15.5)
WBC: 12 10*3/uL — ABNORMAL HIGH (ref 4.0–10.5)
nRBC: 0 % (ref 0.0–0.2)

## 2024-02-20 LAB — SYNOVIAL CELL COUNT + DIFF, W/ CRYSTALS
Crystals, Fluid: NONE SEEN
Eosinophils-Synovial: 0 % (ref 0–1)
Lymphocytes-Synovial Fld: 0 % (ref 0–20)
Monocyte-Macrophage-Synovial Fluid: 7 % — ABNORMAL LOW (ref 50–90)
Neutrophil, Synovial: 93 % — ABNORMAL HIGH (ref 0–25)
Other Cells-SYN: NONE SEEN
WBC, Synovial: 50300 /mm3 — ABNORMAL HIGH (ref 0–200)

## 2024-02-20 LAB — SEDIMENTATION RATE: Sed Rate: 55 mm/h — ABNORMAL HIGH (ref 0–16)

## 2024-02-20 SURGERY — IRRIGATION AND DEBRIDEMENT ELBOW
Anesthesia: General | Laterality: Left

## 2024-02-20 MED ORDER — INSULIN ASPART 100 UNIT/ML IJ SOLN: 0.0000 [IU] | INTRAMUSCULAR | Status: DC | PRN

## 2024-02-20 MED ORDER — ATORVASTATIN CALCIUM 40 MG PO TABS
40.0000 mg | ORAL_TABLET | Freq: Every day | ORAL | Status: DC
  Administered 2024-02-20 – 2024-02-24 (×5): 40 mg via ORAL
  Filled 2024-02-20 (×5): qty 1

## 2024-02-20 MED ORDER — SORBITOL 70 % SOLN
30.0000 mL | Freq: Every day | Status: DC | PRN
  Administered 2024-02-25: 30 mL via ORAL
  Filled 2024-02-20 (×2): qty 30

## 2024-02-20 MED ORDER — MOMETASONE FURO-FORMOTEROL FUM 200-5 MCG/ACT IN AERO
2.0000 | INHALATION_SPRAY | Freq: Two times a day (BID) | RESPIRATORY_TRACT | Status: DC
  Administered 2024-02-20 – 2024-02-25 (×4): 2 via RESPIRATORY_TRACT
  Filled 2024-02-20 (×2): qty 8.8

## 2024-02-20 MED ORDER — SODIUM CHLORIDE 0.9 % IR SOLN
Status: DC | PRN
  Administered 2024-02-20: 3000 mL

## 2024-02-20 MED ORDER — ACETAMINOPHEN 325 MG PO TABS: 650.0000 mg | ORAL_TABLET | Freq: Four times a day (QID) | ORAL | Status: DC | PRN

## 2024-02-20 MED ORDER — HYDRALAZINE HCL 25 MG PO TABS
100.0000 mg | ORAL_TABLET | Freq: Two times a day (BID) | ORAL | Status: DC
Start: 2024-02-21 — End: 2024-02-26
  Administered 2024-02-21 – 2024-02-25 (×8): 100 mg via ORAL
  Filled 2024-02-20 (×7): qty 4
  Filled 2024-02-20: qty 2
  Filled 2024-02-20 (×2): qty 4

## 2024-02-20 MED ORDER — VANCOMYCIN HCL 1750 MG/350ML IV SOLN: 1750.0000 mg | INTRAVENOUS | Status: DC

## 2024-02-20 MED ORDER — ASPIRIN 81 MG PO TBEC
81.0000 mg | DELAYED_RELEASE_TABLET | Freq: Every day | ORAL | Status: DC
  Administered 2024-02-21 – 2024-02-25 (×5): 81 mg via ORAL
  Filled 2024-02-20 (×5): qty 1

## 2024-02-20 MED ORDER — CHLORHEXIDINE GLUCONATE 0.12 % MT SOLN: 15.0000 mL | Freq: Once | OROMUCOSAL | Status: AC

## 2024-02-20 MED ORDER — VANCOMYCIN HCL 1000 MG IV SOLR
INTRAVENOUS | Status: DC | PRN
  Administered 2024-02-20: 1000 mg via INTRAVENOUS

## 2024-02-20 MED ORDER — HYDROMORPHONE HCL 1 MG/ML IJ SOLN
0.5000 mg | Freq: Once | INTRAMUSCULAR | Status: AC
  Administered 2024-02-20: 0.5 mg via INTRAVENOUS
  Filled 2024-02-20: qty 0.5

## 2024-02-20 MED ORDER — LIDOCAINE HCL (PF) 1 % IJ SOLN
INTRAMUSCULAR | Status: AC
  Administered 2024-02-20: 5 mL
  Filled 2024-02-20: qty 5

## 2024-02-20 MED ORDER — LIDOCAINE HCL (PF) 1 % IJ SOLN: 5.0000 mL | Freq: Once | INTRAMUSCULAR | Status: AC

## 2024-02-20 MED ORDER — ALBUTEROL SULFATE HFA 108 (90 BASE) MCG/ACT IN AERS: 2.0000 | INHALATION_SPRAY | Freq: Four times a day (QID) | RESPIRATORY_TRACT | Status: DC | PRN

## 2024-02-20 MED ORDER — DEXAMETHASONE SODIUM PHOSPHATE 10 MG/ML IJ SOLN
INTRAMUSCULAR | Status: AC
  Filled 2024-02-20: qty 1

## 2024-02-20 MED ORDER — VANCOMYCIN HCL IN DEXTROSE 1-5 GM/200ML-% IV SOLN
INTRAVENOUS | Status: AC
  Filled 2024-02-20: qty 200

## 2024-02-20 MED ORDER — PHENYLEPHRINE 80 MCG/ML (10ML) SYRINGE FOR IV PUSH (FOR BLOOD PRESSURE SUPPORT)
PREFILLED_SYRINGE | INTRAVENOUS | Status: AC
  Filled 2024-02-20: qty 10

## 2024-02-20 MED ORDER — FENTANYL CITRATE (PF) 100 MCG/2ML IJ SOLN
25.0000 ug | INTRAMUSCULAR | Status: DC | PRN
  Administered 2024-02-20 (×3): 50 ug via INTRAVENOUS

## 2024-02-20 MED ORDER — GABAPENTIN 300 MG PO CAPS
300.0000 mg | ORAL_CAPSULE | Freq: Two times a day (BID) | ORAL | Status: DC
  Administered 2024-02-20 – 2024-02-25 (×10): 300 mg via ORAL
  Filled 2024-02-20 (×10): qty 1

## 2024-02-20 MED ORDER — LEVOTHYROXINE SODIUM 75 MCG PO TABS
75.0000 ug | ORAL_TABLET | Freq: Every day | ORAL | Status: DC
  Administered 2024-02-21 – 2024-02-25 (×5): 75 ug via ORAL
  Filled 2024-02-20 (×5): qty 1

## 2024-02-20 MED ORDER — PANTOPRAZOLE SODIUM 40 MG PO TBEC
40.0000 mg | DELAYED_RELEASE_TABLET | Freq: Every day | ORAL | Status: DC
  Administered 2024-02-21 – 2024-02-25 (×5): 40 mg via ORAL
  Filled 2024-02-20 (×5): qty 1

## 2024-02-20 MED ORDER — ALBUTEROL SULFATE (2.5 MG/3ML) 0.083% IN NEBU: 2.5000 mg | INHALATION_SOLUTION | Freq: Four times a day (QID) | RESPIRATORY_TRACT | Status: DC | PRN

## 2024-02-20 MED ORDER — ORAL CARE MOUTH RINSE
15.0000 mL | Freq: Once | OROMUCOSAL | Status: AC
  Administered 2024-02-20: 15 mL via OROMUCOSAL

## 2024-02-20 MED ORDER — PHENYLEPHRINE 80 MCG/ML (10ML) SYRINGE FOR IV PUSH (FOR BLOOD PRESSURE SUPPORT)
PREFILLED_SYRINGE | INTRAVENOUS | Status: DC | PRN
  Administered 2024-02-20 (×3): 80 ug via INTRAVENOUS
  Administered 2024-02-20: 160 ug via INTRAVENOUS

## 2024-02-20 MED ORDER — ONDANSETRON HCL 4 MG/2ML IJ SOLN
INTRAMUSCULAR | Status: AC
  Filled 2024-02-20: qty 2

## 2024-02-20 MED ORDER — TORSEMIDE 20 MG PO TABS
40.0000 mg | ORAL_TABLET | Freq: Two times a day (BID) | ORAL | Status: DC
Start: 2024-02-20 — End: 2024-02-26
  Administered 2024-02-20 – 2024-02-25 (×11): 40 mg via ORAL
  Filled 2024-02-20 (×11): qty 2

## 2024-02-20 MED ORDER — HYDROXYZINE HCL 25 MG PO TABS
25.0000 mg | ORAL_TABLET | Freq: Three times a day (TID) | ORAL | Status: DC | PRN
  Administered 2024-02-20 – 2024-02-24 (×8): 25 mg via ORAL
  Filled 2024-02-20 (×8): qty 1

## 2024-02-20 MED ORDER — POLYETHYLENE GLYCOL 3350 17 G PO PACK
17.0000 g | PACK | Freq: Every day | ORAL | Status: DC | PRN
  Administered 2024-02-21 – 2024-02-24 (×4): 17 g via ORAL
  Filled 2024-02-20 (×6): qty 1

## 2024-02-20 MED ORDER — PROPOFOL 10 MG/ML IV BOLUS
INTRAVENOUS | Status: AC
  Filled 2024-02-20: qty 20

## 2024-02-20 MED ORDER — SODIUM CHLORIDE 0.9 % IV SOLN: INTRAVENOUS | Status: DC

## 2024-02-20 MED ORDER — LIDOCAINE 2% (20 MG/ML) 5 ML SYRINGE
INTRAMUSCULAR | Status: AC
  Filled 2024-02-20: qty 5

## 2024-02-20 MED ORDER — PROPOFOL 10 MG/ML IV BOLUS
INTRAVENOUS | Status: DC | PRN
  Administered 2024-02-20: 60 mg via INTRAVENOUS
  Administered 2024-02-20: 140 mg via INTRAVENOUS

## 2024-02-20 MED ORDER — INSULIN ASPART 100 UNIT/ML IJ SOLN
0.0000 [IU] | Freq: Three times a day (TID) | INTRAMUSCULAR | Status: DC
  Administered 2024-02-21: 2 [IU] via SUBCUTANEOUS
  Administered 2024-02-21: 1 [IU] via SUBCUTANEOUS
  Administered 2024-02-22: 2 [IU] via SUBCUTANEOUS
  Administered 2024-02-22: 3 [IU] via SUBCUTANEOUS
  Administered 2024-02-22: 2 [IU] via SUBCUTANEOUS
  Administered 2024-02-23 – 2024-02-25 (×5): 1 [IU] via SUBCUTANEOUS

## 2024-02-20 MED ORDER — ALBUMIN HUMAN 5 % IV SOLN
12.5000 g | Freq: Once | INTRAVENOUS | Status: AC
  Administered 2024-02-20: 12.5 g via INTRAVENOUS

## 2024-02-20 MED ORDER — SODIUM CHLORIDE 0.9% FLUSH
3.0000 mL | Freq: Two times a day (BID) | INTRAVENOUS | Status: DC
  Administered 2024-02-20 – 2024-02-25 (×9): 3 mL via INTRAVENOUS

## 2024-02-20 MED ORDER — SODIUM CHLORIDE 0.9 % IR SOLN
Status: DC | PRN
  Administered 2024-02-20: 1000 mL

## 2024-02-20 MED ORDER — ACETAMINOPHEN 650 MG RE SUPP
650.0000 mg | Freq: Four times a day (QID) | RECTAL | Status: DC | PRN
Start: 2024-02-20 — End: 2024-02-26

## 2024-02-20 MED ORDER — CARVEDILOL 25 MG PO TABS
25.0000 mg | ORAL_TABLET | Freq: Two times a day (BID) | ORAL | Status: DC
  Administered 2024-02-20 – 2024-02-25 (×9): 25 mg via ORAL
  Filled 2024-02-20 (×11): qty 1

## 2024-02-20 MED ORDER — MIDAZOLAM HCL 2 MG/2ML IJ SOLN
INTRAMUSCULAR | Status: AC
  Filled 2024-02-20: qty 2

## 2024-02-20 MED ORDER — TAMSULOSIN HCL 0.4 MG PO CAPS
0.8000 mg | ORAL_CAPSULE | Freq: Every evening | ORAL | Status: DC
  Administered 2024-02-20 – 2024-02-25 (×6): 0.8 mg via ORAL
  Filled 2024-02-20 (×6): qty 2

## 2024-02-20 MED ORDER — FENTANYL CITRATE (PF) 100 MCG/2ML IJ SOLN
INTRAMUSCULAR | Status: AC
  Filled 2024-02-20: qty 2

## 2024-02-20 MED ORDER — FENTANYL CITRATE (PF) 100 MCG/2ML IJ SOLN
INTRAMUSCULAR | Status: AC
Start: 2024-02-20 — End: 2024-02-21
  Filled 2024-02-20: qty 2

## 2024-02-20 MED ORDER — ENOXAPARIN SODIUM 40 MG/0.4ML IJ SOSY
40.0000 mg | PREFILLED_SYRINGE | INTRAMUSCULAR | Status: DC
  Administered 2024-02-20 – 2024-02-25 (×6): 40 mg via SUBCUTANEOUS
  Filled 2024-02-20 (×6): qty 0.4

## 2024-02-20 MED ORDER — LIDOCAINE 2% (20 MG/ML) 5 ML SYRINGE
INTRAMUSCULAR | Status: DC | PRN
  Administered 2024-02-20: 60 mg via INTRAVENOUS

## 2024-02-20 MED ORDER — PHENYLEPHRINE HCL-NACL 20-0.9 MG/250ML-% IV SOLN
INTRAVENOUS | Status: DC | PRN
Start: 2024-02-20 — End: 2024-02-20
  Administered 2024-02-20: 90 ug/min via INTRAVENOUS

## 2024-02-20 MED ORDER — ALBUMIN HUMAN 5 % IV SOLN
INTRAVENOUS | Status: AC
  Filled 2024-02-20: qty 250

## 2024-02-20 MED ORDER — MIDAZOLAM HCL 2 MG/2ML IJ SOLN
INTRAMUSCULAR | Status: DC | PRN
  Administered 2024-02-20 (×2): 1 mg via INTRAVENOUS

## 2024-02-20 MED ORDER — HYDROCODONE-ACETAMINOPHEN 5-325 MG PO TABS
1.0000 | ORAL_TABLET | Freq: Four times a day (QID) | ORAL | Status: DC | PRN
  Administered 2024-02-20 – 2024-02-22 (×8): 2 via ORAL
  Administered 2024-02-22: 1 via ORAL
  Administered 2024-02-23 – 2024-02-25 (×7): 2 via ORAL
  Filled 2024-02-20 (×4): qty 2
  Filled 2024-02-20: qty 1
  Filled 2024-02-20 (×13): qty 2

## 2024-02-20 MED ORDER — TRAZODONE HCL 50 MG PO TABS
50.0000 mg | ORAL_TABLET | Freq: Every day | ORAL | Status: DC
  Administered 2024-02-20 – 2024-02-24 (×5): 50 mg via ORAL
  Filled 2024-02-20 (×5): qty 1

## 2024-02-20 MED ORDER — POLYVINYL ALCOHOL 1.4 % OP SOLN
1.0000 [drp] | Freq: Every day | OPHTHALMIC | Status: DC
Start: 2024-02-20 — End: 2024-02-26
  Administered 2024-02-21 – 2024-02-25 (×5): 1 [drp] via OPHTHALMIC
  Filled 2024-02-20 (×2): qty 15

## 2024-02-20 MED ORDER — HYDROMORPHONE HCL 1 MG/ML IJ SOLN
0.5000 mg | INTRAMUSCULAR | Status: DC | PRN
  Administered 2024-02-20 – 2024-02-21 (×3): 1 mg via INTRAVENOUS
  Filled 2024-02-20 (×3): qty 1

## 2024-02-20 MED ORDER — VANCOMYCIN HCL IN DEXTROSE 1-5 GM/200ML-% IV SOLN
1000.0000 mg | Freq: Once | INTRAVENOUS | Status: AC
  Administered 2024-02-20: 1000 mg via INTRAVENOUS
  Filled 2024-02-20: qty 200

## 2024-02-20 SURGICAL SUPPLY — 47 items
BAG COUNTER SPONGE SURGICOUNT (BAG) ×2 IMPLANT
BNDG COHESIVE 4X5 TAN STRL LF (GAUZE/BANDAGES/DRESSINGS) ×2 IMPLANT
BNDG COHESIVE 6X5 TAN ST LF (GAUZE/BANDAGES/DRESSINGS) ×4 IMPLANT
BNDG ELASTIC 3INX 5YD STR LF (GAUZE/BANDAGES/DRESSINGS) IMPLANT
BNDG GAUZE DERMACEA FLUFF 4 (GAUZE/BANDAGES/DRESSINGS) ×2 IMPLANT
CLSR STERI-STRIP ANTIMIC 1/2X4 (GAUZE/BANDAGES/DRESSINGS) IMPLANT
COVER SURGICAL LIGHT HANDLE (MISCELLANEOUS) ×2 IMPLANT
CUFF TOURN SGL QUICK 42 (TOURNIQUET CUFF) IMPLANT
CUFF TRNQT CYL 24X4X16.5-23 (TOURNIQUET CUFF) IMPLANT
CUFF TRNQT CYL 34X4.125X (TOURNIQUET CUFF) IMPLANT
DRAPE U-SHAPE 47X51 STRL (DRAPES) ×2 IMPLANT
DRSG AQUACEL AG ADV 3.5X 4 (GAUZE/BANDAGES/DRESSINGS) IMPLANT
DURAPREP 26ML APPLICATOR (WOUND CARE) ×2 IMPLANT
ELECT REM PT RETURN 9FT ADLT (ELECTROSURGICAL) IMPLANT
ELECTRODE REM PT RTRN 9FT ADLT (ELECTROSURGICAL) IMPLANT
GAUZE PAD ABD 8X10 STRL (GAUZE/BANDAGES/DRESSINGS) ×2 IMPLANT
GAUZE SPONGE 4X4 12PLY STRL (GAUZE/BANDAGES/DRESSINGS) ×4 IMPLANT
GAUZE XEROFORM 5X9 LF (GAUZE/BANDAGES/DRESSINGS) ×2 IMPLANT
GLOVE BIOGEL PI IND STRL 7.0 (GLOVE) ×4 IMPLANT
GLOVE BIOGEL PI IND STRL 7.5 (GLOVE) ×2 IMPLANT
GLOVE ECLIPSE 7.0 STRL STRAW (GLOVE) ×2 IMPLANT
GLOVE SKINSENSE STRL SZ7.5 (GLOVE) ×4 IMPLANT
GLOVE SURG SYN 7.5 E (GLOVE) ×2 IMPLANT
GLOVE SURG SYN 7.5 PF PI (GLOVE) ×4 IMPLANT
GLOVE SURG UNDER POLY LF SZ7 (GLOVE) ×38 IMPLANT
GLOVE SURG UNDER POLY LF SZ7.5 (GLOVE) ×8 IMPLANT
GOWN STRL SURGICAL XL XLNG (GOWN DISPOSABLE) ×4 IMPLANT
KIT BASIN OR (CUSTOM PROCEDURE TRAY) ×2 IMPLANT
KIT TURNOVER KIT B (KITS) ×2 IMPLANT
MANIFOLD NEPTUNE II (INSTRUMENTS) ×2 IMPLANT
PACK ORTHO EXTREMITY (CUSTOM PROCEDURE TRAY) ×2 IMPLANT
PAD ARMBOARD POSITIONER FOAM (MISCELLANEOUS) ×4 IMPLANT
PADDING CAST ABS COTTON 4X4 ST (CAST SUPPLIES) ×2 IMPLANT
PADDING CAST COTTON 6X4 STRL (CAST SUPPLIES) ×2 IMPLANT
SET HNDPC FAN SPRY TIP SCT (DISPOSABLE) IMPLANT
SPONGE T-LAP 18X18 ~~LOC~~+RFID (SPONGE) ×2 IMPLANT
STOCKINETTE IMPERVIOUS 9X36 MD (GAUZE/BANDAGES/DRESSINGS) ×2 IMPLANT
SUT ETHILON 2 0 FS 18 (SUTURE) ×2 IMPLANT
SUT ETHILON 2 0 PSLX (SUTURE) IMPLANT
SUT VIC AB 2-0 FS1 27 (SUTURE) ×4 IMPLANT
SWAB CULTURE ESWAB REG 1ML (MISCELLANEOUS) IMPLANT
TOWEL GREEN STERILE (TOWEL DISPOSABLE) ×2 IMPLANT
TOWEL GREEN STERILE FF (TOWEL DISPOSABLE) ×2 IMPLANT
TUBE CONNECTING 12X1/4 (SUCTIONS) ×2 IMPLANT
UNDERPAD 30X36 HEAVY ABSORB (UNDERPADS AND DIAPERS) ×4 IMPLANT
WATER STERILE IRR 1000ML POUR (IV SOLUTION) ×2 IMPLANT
YANKAUER SUCT BULB TIP NO VENT (SUCTIONS) ×2 IMPLANT

## 2024-02-20 NOTE — Anesthesia Preprocedure Evaluation (Addendum)
 Anesthesia Evaluation  Patient identified by MRN, date of birth, ID band Patient awake    Reviewed: Allergy & Precautions, NPO status , Patient's Chart, lab work & pertinent test results  Airway Mallampati: III  TM Distance: >3 FB Neck ROM: Full    Dental  (+) Missing, Poor Dentition   Pulmonary sleep apnea , COPD, former smoker   Pulmonary exam normal        Cardiovascular hypertension, Pt. on medications and Pt. on home beta blockers + CAD, + Past MI, + Peripheral Vascular Disease and +CHF   Rhythm:Regular Rate:Normal     Neuro/Psych    Depression    negative neurological ROS     GI/Hepatic Neg liver ROS,GERD  ,,  Endo/Other  diabetesHypothyroidism    Renal/GU   negative genitourinary   Musculoskeletal  (+) Arthritis , Osteoarthritis,  Elbow septic arthritis    Abdominal Normal abdominal exam  (+)   Peds  Hematology  (+) Blood dyscrasia, anemia Lab Results      Component                Value               Date                      WBC                      11.8 (H)            02/19/2024                HGB                      10.3 (L)            02/19/2024                HCT                      31.1 (L)            02/19/2024                MCV                      88.9                02/19/2024                PLT                      213                 02/19/2024             Lab Results      Component                Value               Date                      NA                       135                 02/19/2024  K                        4.0                 02/19/2024                CO2                      23                  02/19/2024                GLUCOSE                  140 (H)             02/19/2024                BUN                      73 (H)              02/19/2024                CREATININE               2.44 (H)            02/19/2024                CALCIUM                  7.9  (L)             02/19/2024                EGFR                     24.0                11/18/2023                GFRNONAA                 29 (L)              02/19/2024              Anesthesia Other Findings   Reproductive/Obstetrics                             Anesthesia Physical Anesthesia Plan  ASA: 3  Anesthesia Plan: General   Post-op Pain Management:    Induction: Intravenous  PONV Risk Score and Plan: 2 and Ondansetron, Dexamethasone, Midazolam and Treatment may vary due to age or medical condition  Airway Management Planned: Mask and LMA  Additional Equipment: None  Intra-op Plan:   Post-operative Plan: Extubation in OR  Informed Consent: I have reviewed the patients History and Physical, chart, labs and discussed the procedure including the risks, benefits and alternatives for the proposed anesthesia with the patient or authorized representative who has indicated his/her understanding and acceptance.     Dental advisory given  Plan Discussed with: CRNA  Anesthesia Plan Comments:        Anesthesia Quick Evaluation

## 2024-02-20 NOTE — Progress Notes (Signed)
   02/20/24 2114  BiPAP/CPAP/SIPAP  Reason BIPAP/CPAP not in use Non-compliant (pt states that he does not wear and CPAP and not want to try it here.)  BiPAP/CPAP /SiPAP Vitals  SpO2 95 %  Bilateral Breath Sounds Clear;Diminished

## 2024-02-20 NOTE — Transfer of Care (Signed)
 Immediate Anesthesia Transfer of Care Note  Patient: Shane Chambers  Procedure(s) Performed: LEFT ELBOW IRRIGATION AND DEBRIDEMENT (Left)  Patient Location: PACU  Anesthesia Type:General  Level of Consciousness: awake and alert   Airway & Oxygen Therapy: Patient Spontanous Breathing and Patient connected to face mask oxygen  Post-op Assessment: Report given to RN and Post -op Vital signs reviewed and stable  Post vital signs: Reviewed and stable  Last Vitals:  Vitals Value Taken Time  BP 137/75 02/20/24 1226  Temp 36.1 C 02/20/24 1226  Pulse 58 02/20/24 1229  Resp 18 02/20/24 1229  SpO2 100 % 02/20/24 1229  Vitals shown include unfiled device data.  Last Pain:  Vitals:   02/20/24 1110  TempSrc: Oral  PainSc:          Complications: There were no known notable events for this encounter.

## 2024-02-20 NOTE — Op Note (Signed)
 Operative Note  Shane Chambers  Surgery Date: 02/20/2024 Surgeon: Marilee Showers, MD Assistant(s): None  Preop Diagnosis(es):  Left elbow septic arthritis  Postop Diagnosis(es):  Left elbow septic arthritis  Operative Procedure(s): Left elbow irrigation and debridement including fascia, muscle, bone   Anesthesia: The patient had administration of general anesthesia. Further details can be found in the anesthesia record.  Estimated Blood Loss: 5 mL  Complications:  None noted intraoperatively  Drains: None  Tourniquet Time: Not used  Findings:  Large, turbid joint effusion  Indications:  Shane Chambers is a 63 y.o. year old male who presented to outside hospital with a left elbow pain was found to have concern for left elbow septic arthritis.  He was transferred to [facility] for surgical management.  I discussed formal irrigation debridement in the OR versus nonoperative management and for the long-term health of his elbow 1 to proceed with irrigation debridement..  After thorough discussion of the risks and benefits of surgical management and alternative nonoperative treatment options, they elected to proceed with surgical treatment. Risks and complications were discussed and understood including, but not limited to, bleeding, infection, stiffness, numbness, damage to surrounding structures (including blood vessels and nerves), failure of the procedure, need for secondary or revision procedures, failure of healing, incomplete functional recovery, and worsening or chronic pain. Additional risks pertinent to the surgery and anesthesia also include pulmonary compromise, blood clots/pulmonary embolism, cardiac complications, and death. No guarantees were stated or implied. All questions were answered to the best of my ability and the patient verbalized understanding.    Description of Procedure:  The patient was identified in the holding area, taken to the operating room and  underwent successful induction of anesthesia. They were then placed on the operating table in a  position, with all bony prominences well padded. Preprocedure antibiotics were administered. A time out was performed and all parties were in agreement with the patient identification, surgical site and planned procedure. The extremity was then prepped and draped in usual sterile fashion. A second time out was performed prior to incision, once again confirming the patient, site, procedure and expectations of surgery.  A longitudinal incision was made over the lateral epicondyle extending distally towards the anterior third of the radial head.  Full-thickness skin flaps were raised and the common extensor tendons were identified.  The overlying fascia was incised longitudinally at the one third anterior mark of the radial head.  This brought us  into the joint and an immediate rush of turbid effusion was evacuated.  We took cultures at this point time to send to the lab.  The remainder of the effusion was then evacuated.  We then performed irrigation of the joint and surrounding tissues using 1 L of normal saline.  At this point we used curettes and a rongeur to debride the muscle, fascia and bone in the overlying region to remove any devitalized tissue.  We then proceeded to continue with 2 more liters of irrigation of the joint.  At this point we are satisfied with their thorough irrigation as well as her debridement.  Closure of the extensor tendon fascia was performed using 0 PDS suture.  Skin was closed using 3-0 Monocryl suture and a sterile dressing was placed.  All counts at the end of the case were correct.  Post-Operative Condition: The patient was transferred to the PACU in stable condition.  Post-Operative Plan: They will be weightbearing as tolerated for the elbow.  Antibiotics per primary/ID and we  will follow-up the cultures.

## 2024-02-20 NOTE — Anesthesia Procedure Notes (Signed)
 Procedure Name: LMA Insertion Date/Time: 02/20/2024 11:53 AM  Performed by: Samul Croft, CRNAPre-anesthesia Checklist: Patient identified, Suction available, Emergency Drugs available, Patient being monitored and Timeout performed Patient Re-evaluated:Patient Re-evaluated prior to induction Oxygen Delivery Method: Circle system utilized Preoxygenation: Pre-oxygenation with 100% oxygen Induction Type: IV induction Ventilation: Mask ventilation without difficulty LMA: LMA inserted LMA Size: 5.0 Tube secured with: Tape Comments: 1st LMA 4 placed with large leak. Changed to LMA 5, Vts 300s, no leak <20 cmH20 pressure.

## 2024-02-20 NOTE — Consult Note (Signed)
 Orthopedic Consultation Note  Current Hospital Day : Hospital Day: 2  Reason For Consult: Left elbow septic arthritis  History of Present Illness:  AADIT HAGOOD is a 63 y.o. male who presented to the emergency department yesterday due to left elbow pain.  His exam was concerning for septic arthritis therefore arthrocentesis were performed in the emergency department.  The aspiration was concerning for septic arthritis so I was consulted by telephone regarding the patient.  We recommended transfer to [facility] for evaluation and likely surgical intervention  On exam, he plans to die of left elbow pain, he has pain with range of motion, he is subjectively afebrile.  He has no history of left elbow surgery.  Past Medical History:  Diagnosis Date   Anemia    Arthritis    CAD (coronary artery disease)    a. s/p CABG in 03/2020 with LIMA-LAD, RIMA-PL, RA-D1-RI-OM1   Cancer (HCC)    rectal   Cellulitis    COPD (chronic obstructive pulmonary disease) (HCC)    Depression    Diabetes mellitus without complication (HCC)    GERD (gastroesophageal reflux disease)    History of kidney stones    Hyperlipidemia 12/01/2019   Hypertension    Hypothyroidism    Ischemic cardiomyopathy    a. EF 20-25% by echo in 02/2020 b. at 40% by echo in 03/2020 c. EF normalized to 60-65% by echo in 04/2020   Myocardial infarction [facility])    Peripheral vascular disease (HCC)    Sleep apnea    Type 2 diabetes mellitus (HCC)     Past Surgical History:  Procedure Laterality Date   AMPUTATION Right 10/05/2022   Procedure: AMPUTATION BELOW KNEE;  Surgeon: Timothy Ford, MD;  Location: [facility] OR;  Service: Orthopedics;  Laterality: Right;   AMPUTATION Right 11/14/2022   Procedure: AMPUTATION BELOW KNEE REVISION; WASHOUT;  Surgeon: Jackquelyn Mass, MD;  Location: [facility] ORS;  Service: Vascular;  Laterality: Right;   AMPUTATION Right 11/18/2022   Procedure: AMPUTATION BELOW KNEE REVISION AND CLOSURE;  Surgeon: Jackquelyn Mass, MD;  Location: [facility] ORS;  Service: Vascular;  Laterality: Right;   APPLICATION OF WOUND VAC Right 10/05/2022   Procedure: APPLICATION OF WOUND VAC;  Surgeon: Timothy Ford, MD;  Location: [facility] OR;  Service: Orthopedics;  Laterality: Right;   BIOPSY  07/18/2021   Procedure: BIOPSY;  Surgeon: Vinetta Greening, DO;  Location: [facility];  Service: Endoscopy;;   BIOPSY  01/09/2022   Procedure: BIOPSY;  Surgeon: Vinetta Greening, DO;  [facility]: [facility];  Service: Endoscopy;;   BIOPSY  06/29/2023   Procedure: BIOPSY;  Surgeon: Vinetta Greening, DO;  [facility]: [facility];  Service: Endoscopy;;   COLONOSCOPY WITH PROPOFOL N/A 07/18/2021   Carver: 15 millimeter polyp removed from the sigmoid colon, 5 mm polyp removed from sigmoid colon.  Nonbleeding internal hemorrhoids.  Significant looping of the colon. sigmoid path showed invasive colonic adenocarcinoma involving tubular adenoma (invades to depth of 2mm, carcinoma 1mm from margin, no lymphovascular invasion, no poorly differentiated component.   COLONOSCOPY WITH PROPOFOL N/A 06/29/2023   Procedure: COLONOSCOPY WITH PROPOFOL;  Surgeon: Vinetta Greening, DO;  [facility]: [facility];  Service: Endoscopy;  Laterality: N/A;  7:30 am, asa 3   CORONARY ARTERY BYPASS GRAFT N/A 03/13/2020   Procedure: CORONARY ARTERY BYPASS GRAFTING (CABG) times five using bilateral Internal mammary arteries and left radial artery;  Surgeon: Rudine Cos, MD;  Location: [facility] OR;  Service: Open Heart Surgery;  Laterality: N/A;   DENTAL SURGERY     ESOPHAGOGASTRODUODENOSCOPY (EGD) WITH PROPOFOL N/A 07/18/2021   Carver: 1 gastric polyp status post biopsy, gastritis. gastric bx with slight chronic inflammation and no H.pyori. GEJ polypectomy with mild inflammation only   FLEXIBLE SIGMOIDOSCOPY N/A 08/26/2021   Carver: Nonbleeding internal hemorrhoids.  15 mm ulcers from previous polypectomy found in the rectum.  No evidence of previous polyp.  Located 5 to  8 cm from anal verge.   FLEXIBLE SIGMOIDOSCOPY N/A 01/09/2022   Procedure: FLEXIBLE SIGMOIDOSCOPY;  Surgeon: Vinetta Greening, DO;  [facility];  Service: Endoscopy;  Laterality: N/A;   POLYPECTOMY  07/18/2021   Procedure: POLYPECTOMY INTESTINAL;  Surgeon: Vinetta Greening, DO;  [facility];  Service: Endoscopy;;   POLYPECTOMY  06/29/2023   Procedure: POLYPECTOMY INTESTINAL;  Surgeon: Vinetta Greening, DO;  [facility];  Service: Endoscopy;;   RADIAL ARTERY HARVEST Left 03/13/2020   Procedure: RADIAL ARTERY HARVEST,;  Surgeon: Rudine Cos, MD;  Location: [facility] OR;  Service: Open Heart Surgery;  Laterality: Left;   RIGHT/LEFT HEART CATH AND CORONARY ANGIOGRAPHY N/A 03/07/2020   Procedure: RIGHT/LEFT HEART CATH AND CORONARY ANGIOGRAPHY;  Surgeon: Arnoldo Lapping, MD;  Location: [facility] INVASIVE CV LAB;  Service: Cardiovascular;  Laterality: N/A;   STUMP REVISION Right 12/17/2022   Procedure: RIGHT BELOW KNEE AMPUTATION REVISION;  Surgeon: Timothy Ford, MD;  Location: [facility] OR;  Service: Orthopedics;  Laterality: Right;   SUBMUCOSAL LIFTING INJECTION  01/09/2022   Procedure: SUBMUCOSAL LIFTING INJECTION;  Surgeon: Vinetta Greening, DO;  [facility];  Service: Endoscopy;;   SUBMUCOSAL TATTOO INJECTION  01/09/2022   Procedure: SUBMUCOSAL TATTOO INJECTION;  Surgeon: Vinetta Greening, DO;  [facility];  Service: Endoscopy;;   TEE WITHOUT CARDIOVERSION N/A 03/13/2020   Procedure: TRANSESOPHAGEAL ECHOCARDIOGRAM (TEE);  Surgeon: Rudine Cos, MD;  Location: [facility] OR;  Service: Open Heart Surgery;  Laterality: N/A;    Prior to Admission medications   Medication Sig Start Date End Date Taking? Authorizing Provider  acetaminophen (TYLENOL) 500 MG tablet Take 1,000 mg by mouth every 8 (eight) hours as needed for fever or moderate pain.    [provider]  albuterol (VENTOLIN HFA) 108 (90 Base) MCG/ACT inhaler Inhale 2 puffs into the lungs  every 6 (six) hours as needed for wheezing or shortness of breath. 05/02/22   Rayfield Cairo, MD  aspirin 81 MG EC tablet Take 1 tablet (81 mg total) by mouth daily with breakfast. 11/29/20   Quintella Buck, Courage, MD  atorvastatin (LIPITOR) 40 MG tablet Take 1 tablet (40 mg total) by mouth at bedtime. 11/28/22   Althia Atlas, MD  bisacodyl 5 MG EC tablet Take 1 tablet (5 mg total) by mouth daily as needed for moderate constipation. 05/28/23   Vinetta Greening, DO  budesonide-formoterol [facility]) 160-4.5 MCG/ACT inhaler Inhale 2 puffs into the lungs in the morning and at bedtime. 05/24/20   Lind Repine, MD  carvedilol (COREG) 25 MG tablet Take 1 tablet (25 mg total) by mouth 2 (two) times daily with a meal. 04/02/23   Ozell Blunt, MD  cetirizine (ZYRTEC) 10 MG chewable tablet Chew 10 mg by mouth daily.    [provider]  ferrous gluconate (FERGON) 324 MG tablet Take 324 mg by mouth daily.    [provider]  gabapentin (NEURONTIN) 300 MG capsule Take 300 mg by mouth 2 (two)  times daily. 10/13/23   [provider]  hydrALAZINE (APRESOLINE) 100 MG tablet Take 1 tablet (100 mg total) by mouth 2 (two) times daily. 10/31/23   Justina Oman, MD  hydrOXYzine (ATARAX) 25 MG tablet Take 1 tablet (25 mg total) by mouth 3 (three) times daily as needed for anxiety. 10/31/23   Justina Oman, MD  insulin aspart (NOVOLOG FLEXPEN) 100 UNIT/ML FlexPen 0-15 Units, Subcutaneous, 3 times daily with meals CBG < 70: implement hypoglycemia protocol-call MD CBG 70 - 120: 0 units CBG 121 - 150: 2 units CBG 151 - 200: 3 units CBG 201 - 250: 5 units CBG 251 - 300: 8 units CBG 301 - 350: 11 units CBG 351 - 400: 15 units CBG > 400: 10/17/22   Ghimire, Estil Heman, MD  ipratropium-albuterol (DUONEB) 0.5-2.5 (3) MG/3ML SOLN Take 3 mLs by nebulization every 6 (six) hours as needed. Patient taking differently: Take 3 mLs by nebulization every 6 (six) hours as needed (SOB/wheezing). 10/17/22    Ghimire, Estil Heman, MD  levothyroxine (SYNTHROID) 75 MCG tablet Take 75 mcg by mouth daily before breakfast.    [provider]  lubiprostone (AMITIZA) 8 MCG capsule Take 1 capsule (8 mcg total) by mouth 2 (two) times daily with a meal. 09/29/23   Delman Ferns, NP  Luliconazole 1 % CREA Apply 1 Application topically in the morning and at bedtime. To sacrum for moisture-related rash 10/21/23   [provider]  metolazone (ZAROXOLYN) 2.5 MG tablet Take 1 tablet (2.5 mg total) by mouth once a week. On Wednesdays 10/31/23   Justina Oman, MD  omeprazole (PRILOSEC) 40 MG capsule Take 40 mg by mouth in the morning. 01/01/22   [provider]  ondansetron (ZOFRAN-ODT) 4 MG disintegrating tablet Take 1 tablet (4 mg total) by mouth every 8 (eight) hours as needed for nausea or vomiting. 01/07/24   Carin Charleston, MD  polyethylene glycol (MIRALAX / GLYCOLAX) 17 g packet Take 17 g by mouth daily as needed for moderate constipation.    [provider]  polyvinyl alcohol (LIQUIFILM TEARS) 1.4 % ophthalmic solution Place 1 drop into both eyes daily.    [provider]  potassium chloride (KLOR-CON) 10 MEQ tablet Take 10 mEq by mouth daily. 10/13/23   [provider]  tamsulosin (FLOMAX) 0.4 MG CAPS capsule Take 0.8 mg by mouth every evening.    [provider]  thiamine (VITAMIN B-1) 100 MG tablet Take 1 tablet (100 mg total) by mouth daily. 10/18/22   Ghimire, Estil Heman, MD  torsemide (DEMADEX) 20 MG tablet Take 2 tablets (40 mg total) by mouth 2 (two) times daily. 10/31/23   Justina Oman, MD  traZODone (DESYREL) 50 MG tablet Take 1 tablet (50 mg total) by mouth at bedtime. 10/31/23   Justina Oman, MD  Vitamin D, Ergocalciferol, (DRISDOL) 1.25 MG (50000 UNIT) CAPS capsule Take 50,000 Units by mouth every 7 (seven) days. (Mondays)    [provider]    Physical Examination Left Upper Extremity: Skin is intact Passive range of motion SILT  Ax/rad/med/ulnar + motor deltoid/grip/EPL/IO Palpable radial pulse   Imaging: X-rays of the left elbow ordered and interpreted demonstrated no fractures, moderate degenerative changes  Assessment:   Shane Chambers is a 63 y.o. male with left elbow septic arthritis.  His ESR and CRP are both elevated as are his white count.  Cell count from elbow aspiration was 50,000 nucleated cells with 93% neutrophils.  I discussed with them this  is highly concerning for septic arthritis and I recommend formal irrigation and debridement in the operating room to optimize his outcomes.  We discussed risks of surgery including neurovascular injury, wound healing issues, need for repeat debridements and the likelihood of a longer course of antibiotics.  He is understanding wish to proceed with surgery  Plan:   Proceed to the OR for elbow irrigation and debridement Remain n.p.o. Appreciate medicine pre-op eval/optimization

## 2024-02-20 NOTE — Brief Op Note (Signed)
 02/20/2024  12:27 PM  PATIENT:  Shane Chambers  63 y.o. male  PRE-OPERATIVE DIAGNOSIS:  Left elbow septic arthritis  POST-OPERATIVE DIAGNOSIS:  Left elbow septic arthritis  PROCEDURE:  Procedure(s): LEFT ELBOW IRRIGATION AND DEBRIDEMENT (Left)  SURGEON:  Surgeons and Role:    Marilee Showers, MD - Primary  PHYSICIAN ASSISTANT:   ASSISTANTS: none   ANESTHESIA:   general  EBL:  5  BLOOD ADMINISTERED:none  DRAINS: none   LOCAL MEDICATIONS USED:  NONE  SPECIMEN:  culture  DISPOSITION OF SPECIMEN:  N/A  COUNTS:  YES  TOURNIQUET:  * No tourniquets in log *  DICTATION: .Dragon Dictation  PLAN OF CARE: Admit to inpatient   PATIENT DISPOSITION:  PACU - hemodynamically stable.   Delay start of Pharmacological VTE agent (>24hrs) due to surgical blood loss or risk of bleeding: no

## 2024-02-20 NOTE — Progress Notes (Signed)
 Orthopaedic Surgery Progress Note  Comfortable in PACU.  No acute events  Exam: Left Upper Extremity: Dressings clean dry and intact SILT Ax/rad/med/ulnar + motor deltoid/grip/EPL/IO Palpable radial pulse   Assessment: 63 y.o. male * Day of Surgery * status post left elbow irrigation and debridement for septic arthritis  Plan: Okay for activities as tolerated with the left elbow Maintain dressing until first postop visit or place a regular bandage if needed Okay for DVT chemoprophylaxis OR and aspiration cultures pending Antibiotics per primary or infectious diseases Discharge instructions placed in Epic

## 2024-02-20 NOTE — ED Notes (Signed)
 Report given to charge at [facility]

## 2024-02-20 NOTE — Discharge Instructions (Signed)
 Orthopaedic Surgery Discharge Instructions  Surgery: Left elbow irrigation and debridement  Weight bearing: Okay for activities as tolerated with the left elbow  Dressings/Incisions: Keep clean and dry at all times. If dressings become wet or soiled, they should be changed with regular dry gauze or a clean bandage. It is OK to shower, gently pat incision or dressing dry afterwards. Leave steri-strips in place until they fall off on their own. DO NOT put anything on your incisions (cream, ointment, lotion, etc).   Follow up appointment: You are scheduled to follow up with Dr. Maryjane Snider. If you do not know when your follow up appointment is, please call [phone] to schedule your appointment. Our office is located at [address] [place], [place], [address].  To reach our office with any questions or concerns please call [phone].

## 2024-02-20 NOTE — Progress Notes (Signed)
 Upon arrival to unit pt notified RN that he has money he would like to have locked up, counted amount with charge RN in front of patient, totaled $239 in bills in one bag, $75 in bils in another, and $64.75 in change; $352.75 total.

## 2024-02-20 NOTE — Anesthesia Postprocedure Evaluation (Signed)
 Anesthesia Post Note  Patient: Shane Chambers  Procedure(s) Performed: LEFT ELBOW IRRIGATION AND DEBRIDEMENT (Left)     Patient location during evaluation: PACU Anesthesia Type: General Level of consciousness: awake and alert Pain management: pain level controlled Vital Signs Assessment: post-procedure vital signs reviewed and stable Respiratory status: spontaneous breathing, nonlabored ventilation, respiratory function stable and patient connected to nasal cannula oxygen Cardiovascular status: blood pressure returned to baseline and stable Postop Assessment: no apparent nausea or vomiting Anesthetic complications: no   There were no known notable events for this encounter.  Last Vitals:  Vitals:   02/20/24 1400 02/20/24 1431  BP: (!) 106/57 (!) 141/86  Pulse: (!) 56   Resp: (!) 7 14  Temp: 36.5 C   SpO2: 97% 100%    Last Pain:  Vitals:   02/20/24 1459  TempSrc:   PainSc: 8                  Zein Helbing P Craigory Toste

## 2024-02-20 NOTE — H&P (Addendum)
 History and Physical   Shane Chambers WUJ:[id number] DOB: [birth year] DOA: 02/19/2024  PCP: Darcel Early, MD   Patient coming from: Home  Chief Complaint: Arm pain  HPI: Shane Chambers is a 63 y.o. male with medical history significant of hypertension, hyperlipidemia, GERD, CKD 3A, CAD status post CABG, COPD, hypothyroidism, diabetes, aortic stenosis, chronic diastolic CHF, anemia, BPH, colon cancer, depression, obesity, OSA, QT prolongation, status post right BKA presenting with left arm pain.  Patient reports 1 day of left arm pain worst at the left elbow and radiating into his arm.  Pain is significant with range of motion.  Was evaluated any pain in the ED and sent to [name], ED to ED for evaluation by orthopedic surgery.  Orthopedic surgery evaluated patient on arrival to [facility] and patient was taken to surgery before being seen by other providers.  Successful washout and now needs admission for antibiotics and  workup.  Denies fevers, chills, chest pain, shortness of breath, abdominal pain, constipation, diarrhea, nausea, vomiting.    ED Course: Vital signs in ED notable for heart rate in the 50s-60s, blood pressure 100s-150 systolic.  Lab workup included BMP with chloride 97, BUN 73, creatinine stable 2.44, glucose 140, calcium 7.9.  CBC with leukocytosis to 11.8 and hemoglobin stable at 10.3.  ESR elevated to 55 CRP elevated to 2.8.  Synovial culture and Gram stain pending.  Cell culture showed 50,000 WBCs.  Operative culture pending.  Patient received pain medication, slight cell insulin, albumin so far in the ED and operatively.  As above orthopedics has seen the patient and are following.  Review of Systems: As per HPI otherwise all other systems reviewed and are negative.  Past Medical History:  Diagnosis Date   Acute on chronic combined systolic and diastolic CHF (congestive heart failure) (HCC) 03/05/2020   Acute on chronic heart failure with preserved ejection fraction  (HFpEF) (HCC) 03/27/2023   Anemia    Arthritis    CAD (coronary artery disease)    a. s/p CABG in 03/2020 with LIMA-LAD, RIMA-PL, RA-[id number]-OM1   Cancer (HCC)    rectal   Cellulitis    COPD (chronic obstructive pulmonary disease) (HCC)    Depression    Diabetes mellitus without complication (HCC)    GERD (gastroesophageal reflux disease)    History of kidney stones    Hyperlipidemia 12/01/2019   Hypertension    Hypothyroidism    Ischemic cardiomyopathy    a. EF 20-25% by echo in 02/2020 b. at 40% by echo in 03/2020 c. EF normalized to 60-65% by echo in 04/2020   Myocardial infarction [facility])    Peripheral vascular disease (HCC)    Sleep apnea    Type 2 diabetes mellitus (HCC)     Past Surgical History:  Procedure Laterality Date   AMPUTATION Right 10/05/2022   Procedure: AMPUTATION BELOW KNEE;  Surgeon: Timothy Ford, MD;  Location[facility] OR;  Service: Orthopedics;  Laterality: Right;   AMPUTATION Right 11/14/2022   Procedure: AMPUTATION BELOW KNEE REVISION; WASHOUT;  Surgeon: Jackquelyn Mass, MD;  Location: [facility] ORS;  Service: Vascular;  Laterality: Right;   AMPUTATION Right 11/18/2022   Procedure: AMPUTATION BELOW KNEE REVISION AND CLOSURE;  Surgeon: Jackquelyn Mass, MD;  Location: [facility] ORS;  Service: Vascular;  Laterality: Right;   APPLICATION OF WOUND VAC Right 10/05/2022   Procedure: APPLICATION OF WOUND VAC;  Surgeon: Timothy Ford, MD;  Location: [facility] OR;  Service: Orthopedics;  Laterality: Right;   BIOPSY  07/18/2021   Procedure: BIOPSY;  Surgeon: Vinetta Greening, DO;  Location: [facility];  Service: Endoscopy;;   BIOPSY  01/09/2022   Procedure: BIOPSY;  Surgeon: Vinetta Greening, DO;  [facility]: [facility];  Service: Endoscopy;;   BIOPSY  06/29/2023   Procedure: BIOPSY;  Surgeon: Vinetta Greening, DO;  [facility]: [facility];  Service: Endoscopy;;   COLONOSCOPY WITH PROPOFOL N/A 07/18/2021   Carver: 15 millimeter polyp removed from the sigmoid colon, 5 mm polyp  removed from sigmoid colon.  Nonbleeding internal hemorrhoids.  Significant looping of the colon. sigmoid path showed invasive colonic adenocarcinoma involving tubular adenoma (invades to depth of 2mm, carcinoma 1mm from margin, no lymphovascular invasion, no poorly differentiated component.   COLONOSCOPY WITH PROPOFOL N/A 06/29/2023   Procedure: COLONOSCOPY WITH PROPOFOL;  Surgeon: Vinetta Greening, DO;  [facility]: [facility];  Service: Endoscopy;  Laterality: N/A;  7:30 am, asa 3   CORONARY ARTERY BYPASS GRAFT N/A 03/13/2020   Procedure: CORONARY ARTERY BYPASS GRAFTING (CABG) times five using bilateral Internal mammary arteries and left radial artery;  Surgeon: Rudine Cos, MD;  Location: [facility] OR;  Service: Open Heart Surgery;  Laterality: N/A;   DENTAL SURGERY     ESOPHAGOGASTRODUODENOSCOPY (EGD) WITH PROPOFOL N/A 07/18/2021   Carver: 1 gastric polyp status post biopsy, gastritis. gastric bx with slight chronic inflammation and no H.pyori. GEJ polypectomy with mild inflammation only   FLEXIBLE SIGMOIDOSCOPY N/A 08/26/2021   Carver: Nonbleeding internal hemorrhoids.  15 mm ulcers from previous polypectomy found in the rectum.  No evidence of previous polyp.  Located 5 to 8 cm from anal verge.   FLEXIBLE SIGMOIDOSCOPY N/A 01/09/2022   Procedure: FLEXIBLE SIGMOIDOSCOPY;  Surgeon: Vinetta Greening, DO;  [facility];  Service: Endoscopy;  Laterality: N/A;   POLYPECTOMY  07/18/2021   Procedure: POLYPECTOMY INTESTINAL;  Surgeon: Vinetta Greening, DO;  [facility];  Service: Endoscopy;;   POLYPECTOMY  06/29/2023   Procedure: POLYPECTOMY INTESTINAL;  Surgeon: Vinetta Greening, DO;  [facility];  Service: Endoscopy;;   RADIAL ARTERY HARVEST Left 03/13/2020   Procedure: RADIAL ARTERY HARVEST,;  Surgeon: Rudine Cos, MD;  Location: [facility] OR;  Service: Open Heart Surgery;  Laterality: Left;   RIGHT/LEFT HEART CATH AND CORONARY ANGIOGRAPHY N/A 03/07/2020    Procedure: RIGHT/LEFT HEART CATH AND CORONARY ANGIOGRAPHY;  Surgeon: Arnoldo Lapping, MD;  Location: [facility] INVASIVE CV LAB;  Service: Cardiovascular;  Laterality: N/A;   STUMP REVISION Right 12/17/2022   Procedure: RIGHT BELOW KNEE AMPUTATION REVISION;  Surgeon: Timothy Ford, MD;  Location: [facility] OR;  Service: Orthopedics;  Laterality: Right;   SUBMUCOSAL LIFTING INJECTION  01/09/2022   Procedure: SUBMUCOSAL LIFTING INJECTION;  Surgeon: Vinetta Greening, DO;  [facility];  Service: Endoscopy;;   SUBMUCOSAL TATTOO INJECTION  01/09/2022   Procedure: SUBMUCOSAL TATTOO INJECTION;  Surgeon: Vinetta Greening, DO;  [facility];  Service: Endoscopy;;   TEE WITHOUT CARDIOVERSION N/A 03/13/2020   Procedure: TRANSESOPHAGEAL ECHOCARDIOGRAM (TEE);  Surgeon: Rudine Cos, MD;  Location: [facility] OR;  Service: Open Heart Surgery;  Laterality: N/A;    Social History  reports that he quit smoking about 28 years ago. His smoking use included cigarettes. He has never used smokeless tobacco. He reports that he does not currently use alcohol. He reports that he does not use drugs.  Allergies  Allergen Reactions   Ceftriaxone Anaphylaxis   Ace Inhibitors Swelling and  Cough   Chlorhexidine    Haloperidol And Related     Do NOT give anti-psychotics due to risk of  Torsades and QT prolongation (per Dr. Daphane Dynes request)   Other Itching    [name]   Shellfish Allergy     Listed on MAR   Penicillins Itching and Rash    Tolerated ancef (12-17-22)    Family History  Problem Relation Age of Onset   Hypertension Mother   Reviewed on admission  Prior to Admission medications   Medication Sig Start Date End Date Taking? Authorizing Provider  acetaminophen (TYLENOL) 500 MG tablet Take 1,000 mg by mouth every 8 (eight) hours as needed for fever or moderate pain.    [provider]  albuterol (VENTOLIN HFA) 108 (90 Base) MCG/ACT inhaler Inhale 2 puffs into the lungs every 6 (six) hours  as needed for wheezing or shortness of breath. 05/02/22   Rayfield Cairo, MD  aspirin 81 MG EC tablet Take 1 tablet (81 mg total) by mouth daily with breakfast. 11/29/20   Quintella Buck, Courage, MD  atorvastatin (LIPITOR) 40 MG tablet Take 1 tablet (40 mg total) by mouth at bedtime. 11/28/22   Althia Atlas, MD  bisacodyl 5 MG EC tablet Take 1 tablet (5 mg total) by mouth daily as needed for moderate constipation. 05/28/23   Vinetta Greening, DO  budesonide-formoterol [facility]) 160-4.5 MCG/ACT inhaler Inhale 2 puffs into the lungs in the morning and at bedtime. 05/24/20   Lind Repine, MD  carvedilol (COREG) 25 MG tablet Take 1 tablet (25 mg total) by mouth 2 (two) times daily with a meal. 04/02/23   Ozell Blunt, MD  cetirizine (ZYRTEC) 10 MG chewable tablet Chew 10 mg by mouth daily.    [provider]  ferrous gluconate (FERGON) 324 MG tablet Take 324 mg by mouth daily.    [provider]  gabapentin (NEURONTIN) 300 MG capsule Take 300 mg by mouth 2 (two) times daily. 10/13/23   [provider]  hydrALAZINE (APRESOLINE) 100 MG tablet Take 1 tablet (100 mg total) by mouth 2 (two) times daily. 10/31/23   Justina Oman, MD  hydrOXYzine (ATARAX) 25 MG tablet Take 1 tablet (25 mg total) by mouth 3 (three) times daily as needed for anxiety. 10/31/23   Justina Oman, MD  insulin aspart (NOVOLOG FLEXPEN) 100 UNIT/ML FlexPen 0-15 Units, Subcutaneous, 3 times daily with meals CBG < 70: implement hypoglycemia protocol-call MD CBG 70 - 120: 0 units CBG 121 - 150: 2 units CBG 151 - 200: 3 units CBG 201 - 250: 5 units CBG 251 - 300: 8 units CBG 301 - 350: 11 units CBG 351 - 400: 15 units CBG > 400: 10/17/22   Ghimire, Estil Heman, MD  ipratropium-albuterol (DUONEB) 0.5-2.5 (3) MG/3ML SOLN Take 3 mLs by nebulization every 6 (six) hours as needed. Patient taking differently: Take 3 mLs by nebulization every 6 (six) hours as needed (SOB/wheezing). 10/17/22   Ghimire, Estil Heman, MD   levothyroxine (SYNTHROID) 75 MCG tablet Take 75 mcg by mouth daily before breakfast.    [provider]  lubiprostone (AMITIZA) 8 MCG capsule Take 1 capsule (8 mcg total) by mouth 2 (two) times daily with a meal. 09/29/23   Delman Ferns, NP  Luliconazole 1 % CREA Apply 1 Application topically in the morning and at bedtime. To sacrum for moisture-related rash 10/21/23   [provider]  metolazone (ZAROXOLYN) 2.5 MG tablet Take 1 tablet (2.5 mg total)  by mouth once a week. On Wednesdays 10/31/23   Justina Oman, MD  omeprazole (PRILOSEC) 40 MG capsule Take 40 mg by mouth in the morning. 01/01/22   [provider]  ondansetron (ZOFRAN-ODT) 4 MG disintegrating tablet Take 1 tablet (4 mg total) by mouth every 8 (eight) hours as needed for nausea or vomiting. 01/07/24   Carin Charleston, MD  polyethylene glycol (MIRALAX / GLYCOLAX) 17 g packet Take 17 g by mouth daily as needed for moderate constipation.    [provider]  polyvinyl alcohol (LIQUIFILM TEARS) 1.4 % ophthalmic solution Place 1 drop into both eyes daily.    [provider]  potassium chloride (KLOR-CON) 10 MEQ tablet Take 10 mEq by mouth daily. 10/13/23   [provider]  tamsulosin (FLOMAX) 0.4 MG CAPS capsule Take 0.8 mg by mouth every evening.    [provider]  thiamine (VITAMIN B-1) 100 MG tablet Take 1 tablet (100 mg total) by mouth daily. 10/18/22   Ghimire, Estil Heman, MD  torsemide (DEMADEX) 20 MG tablet Take 2 tablets (40 mg total) by mouth 2 (two) times daily. 10/31/23   Justina Oman, MD  traZODone (DESYREL) 50 MG tablet Take 1 tablet (50 mg total) by mouth at bedtime. 10/31/23   Justina Oman, MD  Vitamin D, Ergocalciferol, (DRISDOL) 1.25 MG (50000 UNIT) CAPS capsule Take 50,000 Units by mouth every 7 (seven) days. (Mondays)    [provider]    Physical Exam: Vitals:   02/20/24 0930 02/20/24 1110 02/20/24 1226 02/20/24 1230  BP: (!) 146/59 (!) 129/97  137/75 122/61  Pulse:  69 60 (!) 58  Resp: 15 16 15 16   Temp:  98.5 F (36.9 C) (!) 96.9 F (36.1 C)   TempSrc:  Oral    SpO2:  96% 100% 100%  Weight:  106.1 kg    Height:  5\' 10"  (1.778 m)      Physical Exam Constitutional:      General: He is not in acute distress.    Comments: Still somewhat drowsy post operatively   HENT:     Head: Normocephalic and atraumatic.     Mouth/Throat:     Mouth: Mucous membranes are moist.     Pharynx: Oropharynx is clear.  Eyes:     Extraocular Movements: Extraocular movements intact.     Pupils: Pupils are equal, round, and reactive to light.  Cardiovascular:     Rate and Rhythm: Normal rate and regular rhythm.     Pulses: Normal pulses.     Heart sounds: Normal heart sounds.  Pulmonary:     Effort: Pulmonary effort is normal. No respiratory distress.     Breath sounds: Normal breath sounds.  Abdominal:     General: Bowel sounds are normal. There is no distension.     Palpations: Abdomen is soft.     Tenderness: There is no abdominal tenderness.  Musculoskeletal:        General: No swelling or deformity.     Comments: Left elbow bandaged.  Status post right BKA.  Skin:    General: Skin is warm and dry.  Neurological:     General: No focal deficit present.     Mental Status: Mental status is at baseline.    Labs on Admission: I have personally reviewed following labs and imaging studies  CBC: Recent Labs  Lab 02/19/24 2334  WBC 11.8*  NEUTROABS 9.1*  HGB 10.3*  HCT 31.1*  MCV 88.9  PLT 213  Basic Metabolic Panel: Recent Labs  Lab 02/19/24 2334  NA 135  K 4.0  CL 97*  CO2 23  GLUCOSE 140*  BUN 73*  CREATININE 2.44*  CALCIUM 7.9*    GFR: Estimated Creatinine Clearance: 38.8 mL/min (A) (by C-G formula based on SCr of 2.44 mg/dL (H)).  Liver Function Tests: No results for input(s): "AST", "ALT", "ALKPHOS", "BILITOT", "PROT", "ALBUMIN" in the last 168 hours.  Urine analysis:    Component Value Date/Time    COLORURINE YELLOW (A) 07/30/2023 1650   APPEARANCEUR CLEAR (A) 07/30/2023 1650   LABSPEC 1.011 07/30/2023 1650   PHURINE 5.0 07/30/2023 1650   GLUCOSEU NEGATIVE 07/30/2023 1650   HGBUR NEGATIVE 07/30/2023 1650   BILIRUBINUR NEGATIVE 07/30/2023 1650   KETONESUR NEGATIVE 07/30/2023 1650   PROTEINUR 100 (A) 07/30/2023 1650   NITRITE NEGATIVE 07/30/2023 1650   LEUKOCYTESUR NEGATIVE 07/30/2023 1650    Radiological Exams on Admission: DG Elbow Complete Left Result Date: 02/20/2024 CLINICAL DATA:  Pain EXAM: LEFT ELBOW - COMPLETE 3+ VIEW COMPARISON:  None Available. FINDINGS: Mild spurring in the elbow joint. No acute bony abnormality. Specifically, no fracture, subluxation, or dislocation. Left elbow joint effusion. IMPRESSION: Degenerative changes with elbow joint effusion. No visible fracture. If there is clinical concern for occult fracture, consider immobilization and repeat imaging in 1 week if symptoms persist. Electronically Signed   By: Janeece Mechanic M.D.   On: 02/20/2024 00:05   EKG: Not performed thus far.  Assessment/Plan Principal Problem:   Septic joint (HCC) Active Problems:   QT prolongation   Depression   Essential hypertension   COPD (chronic obstructive pulmonary disease) (HCC)   Hypothyroidism   Type II diabetes mellitus with renal manifestations (HCC)   Chronic diastolic CHF (congestive heart failure) (HCC)   CKD stage 3a, GFR 45-59 ml/min (HCC)   BPH (benign prostatic hyperplasia)   Coronary artery disease   S/P CABG x 5   OSA (obstructive sleep apnea)   Cancer of sigmoid colon (HCC)   GERD without esophagitis   Morbid obesity (HCC)   S/P BKA (below knee amputation) unilateral, right (HCC)   Dyslipidemia   Moderate aortic stenosis   Anemia of chronic renal failure   Septic joint > Presenting with elbow pain radiating to the arm and pain with range of motion. > Cell count of synovial fluid showing 50,000 WBCs.  Gram stain and culture pending.  Operative  cultures also pending. > ESR 55, CRP 2.8.  Leukocytosis to 11.8.  Afebrile. > Orthopedic has seen and performed washout. > Reported itching and rash with penicillin, has tolerated Ancef in the past, anaphylaxis to ceftriaxone. - Monitor on telemetry overnight - Plan to discussed with infectious disease, awaiting call back - Vancomycin for now while awaiting culture results - Add on blood cultures - Follow-up synovial culture and operative culture - Trend fever curve and WBC - As needed Tylenol for mild pain, Norco for moderate to severe pain, Dilaudid for severe breakthrough pain - Supportive care  Hypertension > Some low BP post-op, received albumin in PACU - Continue home carvedilol - Continue home torsemide  - Holding hydralazine and metolazone  Hyperlipidemia - Continue home atorvastatin  GERD - Continue home PPI  CKD 3A > Creatinine stable at 2.44 in the ED. - Trend renal function and electrolytes  CAD > Status post CABG - Continue home ASA, carvedilol, atorvastatin  COPD - Replacement Symbicort with formulary Dulera - As needed albuterol  Hypothyroidism - Continue home Synthroid  Diabetes -  SSI  AS Chronic diastolic CHF > Last echo in December 2024 with EF 55-60%, G3 DD, mildly reduced RV function, only mild aortic stenosis. - Continue torsemide - Holding metolazone - Continue home carvedilol  Anemia > Stable at 10.3 - Trend CBC  BPH Neurogenic Bladder - Continue home tamsulosin - Has suprapubic catheter placement planned for 4/14, may need to coordinate this if he is still here.  Depression - Continue home hydroxyzine and trazodone  Obesity - Noted  OSA - Continue home CPAP  QT prolongation - Check EKG - Avoid QT prolonging medications  History of colon cancer Status post right BKA - Noted  DVT prophylaxis: Lovenox Code Status:   Full Family Communication:  None on admission  Disposition Plan:   Patient is  from:  Home  Anticipated DC to:  Home  Anticipated DC date:  1 to 3 days  Anticipated DC barriers: None  Consults called:  Orthopedic surgery, infectious disease (awaiting call back) Admission status:  Inpatient, telemetry  Severity of Illness: The appropriate patient status for this patient is INPATIENT. Inpatient status is judged to be reasonable and necessary in order to provide the required intensity of service to ensure the patient's safety. The patient's presenting symptoms, physical exam findings, and initial radiographic and laboratory data in the context of their chronic comorbidities is felt to place them at high risk for further clinical deterioration. Furthermore, it is not anticipated that the patient will be medically stable for discharge from the hospital within 2 midnights of admission.   * I certify that at the point of admission it is my clinical judgment that the patient will require inpatient hospital care spanning beyond 2 midnights from the point of admission due to high intensity of service, high risk for further deterioration and high frequency of surveillance required.Johnetta Nab MD [facility]  How to contact the [facility] Attending or Consulting provider 7A - 7P or covering provider during after hours 7P -7A, for this patient?   Check the care team in [facility] and look for a) attending/consulting [facility] provider listed and b) the [facility] team listed Log into [email or web address] and use [facility] universal password to access. If you do not have the password, please contact the hospital operator. Locate the [facility] provider you are looking for under [facility] and page to a number that you can be directly reached. If you still have difficulty reaching the provider, please page the [facility] (Director on Call) for the Hospitalists listed on amion for assistance.  02/20/2024, 1:26 PM

## 2024-02-20 NOTE — ED Notes (Signed)
Requested pain medication per pt request

## 2024-02-20 NOTE — Progress Notes (Signed)
 Pharmacy Antibiotic Note  Shane Chambers is a 63 y.o. male admitted on 02/19/2024 with  septic arthritis .  Pharmacy has been consulted for Vancomycin dosing.  Serum creatinine 2.44, CrCl 38.8. This appears to be near baseline for this patient. Patient is obese, with BMI >30. Will utilize volume of distribution co-efficient of 0.5. Status post 1 g IV in OR  Plan: Vancomycin 1000 mg IV to complete load, then 1750 mg IV q48h (eAUC 521, Css min 9.9, Vd coe 0.5) Monitor renal function, cultures, levels as indicated  Height: 5\' 10"  (177.8 cm) Weight: 106.1 kg (234 lb) IBW/kg (Calculated) : 73  Temp (24hrs), Avg:98.3 F (36.8 C), Min:96.9 F (36.1 C), Max:99.1 F (37.3 C)  Recent Labs  Lab 02/19/24 2334  WBC 11.8*  CREATININE 2.44*    Estimated Creatinine Clearance: 38.8 mL/min (A) (by C-G formula based on SCr of 2.44 mg/dL (H)).    Allergies  Allergen Reactions   Ceftriaxone Anaphylaxis   Ace Inhibitors Swelling and Cough   Chlorhexidine    Haloperidol And Related     Do NOT give anti-psychotics due to risk of  Torsades and QT prolongation (per Dr. Daphane Dynes request)   Other Itching    [name]   Shellfish Allergy     Listed on MAR   Penicillins Itching and Rash    Tolerated ancef (12-17-22)    Antimicrobials this admission: Vancomycin 4/12 >>   Dose adjustments this admission: None  Microbiology results: 4/12 Body fluid Cx: IP  Thank you for allowing pharmacy to be a part of this patient's care.  Jennett Model Demauri Advincula 02/20/2024 2:50 PM

## 2024-02-21 ENCOUNTER — Encounter (HOSPITAL_COMMUNITY): Payer: Self-pay | Admitting: Sports Medicine

## 2024-02-21 DIAGNOSIS — M00822 Arthritis due to other bacteria, left elbow: Secondary | ICD-10-CM

## 2024-02-21 DIAGNOSIS — E1122 Type 2 diabetes mellitus with diabetic chronic kidney disease: Secondary | ICD-10-CM

## 2024-02-21 DIAGNOSIS — N189 Chronic kidney disease, unspecified: Secondary | ICD-10-CM | POA: Diagnosis not present

## 2024-02-21 DIAGNOSIS — M009 Pyogenic arthritis, unspecified: Secondary | ICD-10-CM | POA: Diagnosis not present

## 2024-02-21 LAB — CBC
HCT: 30.5 % — ABNORMAL LOW (ref 39.0–52.0)
Hemoglobin: 9.7 g/dL — ABNORMAL LOW (ref 13.0–17.0)
MCH: 29.2 pg (ref 26.0–34.0)
MCHC: 31.8 g/dL (ref 30.0–36.0)
MCV: 91.9 fL (ref 80.0–100.0)
Platelets: 178 10*3/uL (ref 150–400)
RBC: 3.32 MIL/uL — ABNORMAL LOW (ref 4.22–5.81)
RDW: 13 % (ref 11.5–15.5)
WBC: 9.9 10*3/uL (ref 4.0–10.5)
nRBC: 0 % (ref 0.0–0.2)

## 2024-02-21 LAB — BLOOD GAS, ARTERIAL
Acid-Base Excess: 3.8 mmol/L — ABNORMAL HIGH (ref 0.0–2.0)
Bicarbonate: 29.2 mmol/L — ABNORMAL HIGH (ref 20.0–28.0)
O2 Saturation: 98.9 %
Patient temperature: 36.4
pCO2 arterial: 45 mmHg (ref 32–48)
pH, Arterial: 7.42 (ref 7.35–7.45)
pO2, Arterial: 92 mmHg (ref 83–108)

## 2024-02-21 LAB — COMPREHENSIVE METABOLIC PANEL WITH GFR
ALT: 7 U/L (ref 0–44)
AST: 14 U/L — ABNORMAL LOW (ref 15–41)
Albumin: 3.4 g/dL — ABNORMAL LOW (ref 3.5–5.0)
Alkaline Phosphatase: 66 U/L (ref 38–126)
Anion gap: 17 — ABNORMAL HIGH (ref 5–15)
BUN: 76 mg/dL — ABNORMAL HIGH (ref 8–23)
CO2: 19 mmol/L — ABNORMAL LOW (ref 22–32)
Calcium: 7.6 mg/dL — ABNORMAL LOW (ref 8.9–10.3)
Chloride: 101 mmol/L (ref 98–111)
Creatinine, Ser: 2.75 mg/dL — ABNORMAL HIGH (ref 0.61–1.24)
GFR, Estimated: 25 mL/min — ABNORMAL LOW (ref >60)
Glucose, Bld: 97 mg/dL (ref 70–99)
Potassium: 4 mmol/L (ref 3.5–5.1)
Sodium: 137 mmol/L (ref 135–145)
Total Bilirubin: 0.8 mg/dL (ref 0.0–1.2)
Total Protein: 7.1 g/dL (ref 6.5–8.1)

## 2024-02-21 LAB — D-DIMER, QUANTITATIVE: D-Dimer, Quant: 1.24 ug{FEU}/mL — ABNORMAL HIGH (ref 0.00–0.50)

## 2024-02-21 LAB — GLUCOSE, CAPILLARY
Glucose-Capillary: 104 mg/dL — ABNORMAL HIGH (ref 70–99)
Glucose-Capillary: 154 mg/dL — ABNORMAL HIGH (ref 70–99)
Glucose-Capillary: 140 mg/dL — ABNORMAL HIGH (ref 70–99)
Glucose-Capillary: 199 mg/dL — ABNORMAL HIGH (ref 70–99)

## 2024-02-21 LAB — MRSA NEXT GEN BY PCR, NASAL: MRSA by PCR Next Gen: DETECTED — AB

## 2024-02-21 LAB — TROPONIN I (HIGH SENSITIVITY)
Troponin I (High Sensitivity): 14 ng/L (ref <18)
Troponin I (High Sensitivity): 15 ng/L (ref <18)

## 2024-02-21 MED ORDER — LUBIPROSTONE 8 MCG PO CAPS: 8.0000 ug | ORAL_CAPSULE | Freq: Two times a day (BID) | ORAL | Status: DC

## 2024-02-21 MED ORDER — LUBIPROSTONE 8 MCG PO CAPS
8.0000 ug | ORAL_CAPSULE | Freq: Two times a day (BID) | ORAL | Status: DC
  Administered 2024-02-21 – 2024-02-25 (×9): 8 ug via ORAL
  Filled 2024-02-21 (×10): qty 1

## 2024-02-21 MED ORDER — METHYLPREDNISOLONE SODIUM SUCC 125 MG IJ SOLR
40.0000 mg | Freq: Once | INTRAMUSCULAR | Status: AC
  Administered 2024-02-21: 40 mg via INTRAVENOUS
  Filled 2024-02-21: qty 2

## 2024-02-21 MED ORDER — DAPTOMYCIN-SODIUM CHLORIDE 700-0.9 MG/100ML-% IV SOLN
8.0000 mg/kg | Freq: Every day | INTRAVENOUS | Status: DC
  Administered 2024-02-22 – 2024-02-25 (×4): 700 mg via INTRAVENOUS
  Filled 2024-02-21 (×4): qty 100

## 2024-02-21 MED ORDER — DIPHENHYDRAMINE HCL 50 MG/ML IJ SOLN
25.0000 mg | Freq: Once | INTRAMUSCULAR | Status: AC
Start: 2024-02-21 — End: 2024-02-21
  Administered 2024-02-21: 25 mg via INTRAVENOUS
  Filled 2024-02-21: qty 1

## 2024-02-21 MED ORDER — IPRATROPIUM-ALBUTEROL 0.5-2.5 (3) MG/3ML IN SOLN
3.0000 mL | Freq: Four times a day (QID) | RESPIRATORY_TRACT | Status: DC
  Administered 2024-02-21: 3 mL via RESPIRATORY_TRACT
  Filled 2024-02-21: qty 3

## 2024-02-21 MED ORDER — CIPROFLOXACIN IN D5W 400 MG/200ML IV SOLN
400.0000 mg | Freq: Two times a day (BID) | INTRAVENOUS | Status: DC
  Administered 2024-02-22 – 2024-02-23 (×4): 400 mg via INTRAVENOUS
  Filled 2024-02-21 (×5): qty 200

## 2024-02-21 MED ORDER — ALUM & MAG HYDROXIDE-SIMETH 200-200-20 MG/5ML PO SUSP: 30.0000 mL | ORAL | Status: DC | PRN

## 2024-02-21 MED ORDER — SODIUM CHLORIDE 0.9 % IV SOLN
2.0000 g | INTRAVENOUS | Status: DC
  Administered 2024-02-21: 2 g via INTRAVENOUS
  Filled 2024-02-21: qty 20

## 2024-02-21 MED ORDER — CIPROFLOXACIN HCL 500 MG PO TABS: 500.0000 mg | ORAL_TABLET | Freq: Two times a day (BID) | ORAL | Status: DC

## 2024-02-21 MED ORDER — HYDROMORPHONE HCL 1 MG/ML IJ SOLN: 0.5000 mg | INTRAMUSCULAR | Status: DC | PRN

## 2024-02-21 MED ORDER — HYDROMORPHONE HCL 1 MG/ML IJ SOLN
1.0000 mg | INTRAMUSCULAR | Status: DC | PRN
  Administered 2024-02-21 – 2024-02-23 (×5): 1 mg via INTRAVENOUS
  Filled 2024-02-21 (×7): qty 1

## 2024-02-21 NOTE — Consult Note (Addendum)
 [facility]                                                                                        Patient Identification: Patient Name: Shane Chambers MRN: [id number] Admit Date: 02/19/2024 10:19 PM Today's Date: 02/21/2024 Reason for consult: Septic arthritis Requesting provider: Dr. Rufina Cough  Principal Problem:   Septic joint [facility]) Active Problems:   Essential hypertension   Depression   BPH (benign prostatic hyperplasia)   Coronary artery disease   S/P CABG x 5   COPD (chronic obstructive pulmonary disease) (HCC)   OSA (obstructive sleep apnea)   Cancer of sigmoid colon (HCC)   GERD without esophagitis   Morbid obesity (HCC)   QT prolongation   S/P BKA (below knee amputation) unilateral, right (HCC)   Dyslipidemia   Hypothyroidism   CKD stage 3a, GFR 45-59 ml/min (HCC)   Moderate aortic stenosis   Type II diabetes mellitus with renal manifestations (HCC)   Chronic diastolic CHF (congestive heart failure) (HCC)   Anemia of chronic renal failure   Antibiotics:  Vancomycin 4/12  Lines/Hardware:  Assessment # Left elbow septic arthritis  # DM -BG control per primary # CKD-will change vancomycin to daptomycin for renal safety   Recommendations  -Will DC vancomycin and start daptomycin and ceftriaxone for septic arthritis -Follow-up blood cultures as well as OR and aspirate cultures -Monitor CBC, CMP and CPK -Postop care per orthopedics -Will check MRSA PCR, keep on contact precautions due to prior history of MRSA Dr. Fontaine Ice to follow starting 4/14  Addendum 4/24 Concerns for reaction to ceftriaxone although tolerated previously when admitted at [facility], however had SOB reaction while in [place], summer 2024. Will DC ceftriaxone and start ciprofloxacin IV. Qtc 498. Will recheck qtc tomorrow.   Rest of the management as per the primary team. Please call with questions or  concerns.  Thank you for the consult  __________________________________________________________________________________________________________ HPI and Hospital Course: 63 year old male with prior history of CHF, CAD s/p CABG, rectal cancer, COPD, DM, CKD, anemia, BPH depression, kidney stones, GERD, HLD, HTN, hypothyroidism, PVD, sleep apnea, right BKA with revision who presented to the ED on 4/11 for left arm pain for 1 day, mostly in his left elbow radiating upwards.  No known trauma or injury with difficult range of motion due to pain.  Reports prior history of abscess in arm just above the elbow many years ago but no hardware or metals. Denied having any fevers, chills.  Denied having any chest pain, shortness of breath or nausea, vomiting abdominal pain or diarrhea.  Quit smoking several years ago, denies alcohol and IVDU  At ED, afebrile Labs remarkable for creatinine 2.44, CRP 2.8, WBC 11.8, ESR 55 Left elbow Xray  Degenerative changes with elbow joint effusion. No visible fracture. If there is clinical concern for occult fracture, consider immobilization and repeat imaging in 1 week if symptoms persist.   Left elbow synovial fluid turbid WBC 50K, neutrophilic no crystals, no organisms on Gram stain, culture pending 4/12 blood culture pending   4/12 left elbow I&D including fascia, muscle and bone, OR findings with large turbid  joint effusion.  Or cultures sent  ROS: General- Denies fever, chills, loss of appetite and loss of weight HEENT - Denies headache, blurry vision, neck pain, sinus pain Chest - Denies any chest pain, SOB or cough CVS- Denies any dizziness/lightheadedness, syncopal attacks, palpitations Abdomen- Denies any nausea, vomiting, abdominal pain, hematochezia and diarrhea Neuro - Denies any weakness, numbness, tingling sensation Psych - Denies any changes in mood irritability or depressive symptoms GU- Denies any burning, dysuria, hematuria or increased frequency of  urination Skin - denies any rashes/lesions MSK -left elbow soreness and difficulty range of motion due to pain  Past Medical History:  Diagnosis Date   Acute on chronic combined systolic and diastolic CHF (congestive heart failure) (HCC) 03/05/2020   Acute on chronic heart failure with preserved ejection fraction (HFpEF) (HCC) 03/27/2023   Anemia    Arthritis    CAD (coronary artery disease)    a. s/p CABG in 03/2020 with LIMA-LAD, RIMA-PL, RA-D1-RI-OM1   Cancer (HCC)    rectal   Cellulitis    COPD (chronic obstructive pulmonary disease) (HCC)    Depression    Diabetes mellitus without complication (HCC)    GERD (gastroesophageal reflux disease)    History of kidney stones    Hyperlipidemia 12/01/2019   Hypertension    Hypothyroidism    Ischemic cardiomyopathy    a. EF 20-25% by echo in 02/2020 b. at 40% by echo in 03/2020 c. EF normalized to 60-65% by echo in 04/2020   Myocardial infarction [facility])    Peripheral vascular disease (HCC)    Sleep apnea    Type 2 diabetes mellitus (HCC)    Past Surgical History:  Procedure Laterality Date   AMPUTATION Right 10/05/2022   Procedure: AMPUTATION BELOW KNEE;  Surgeon: Timothy Ford, MD;  Location: [facility] OR;  Service: Orthopedics;  Laterality: Right;   AMPUTATION Right 11/14/2022   Procedure: AMPUTATION BELOW KNEE REVISION; WASHOUT;  Surgeon: Jackquelyn Mass, MD;  Location: [facility] ORS;  Service: Vascular;  Laterality: Right;   AMPUTATION Right 11/18/2022   Procedure: AMPUTATION BELOW KNEE REVISION AND CLOSURE;  Surgeon: Jackquelyn Mass, MD;  Location: [facility] ORS;  Service: Vascular;  Laterality: Right;   APPLICATION OF WOUND VAC Right 10/05/2022   Procedure: APPLICATION OF WOUND VAC;  Surgeon: Timothy Ford, MD;  Location: [facility] OR;  Service: Orthopedics;  Laterality: Right;   BIOPSY  07/18/2021   Procedure: BIOPSY;  Surgeon: Vinetta Greening, DO;  [facility];  Service: Endoscopy;;   BIOPSY  01/09/2022   Procedure: BIOPSY;   Surgeon: Vinetta Greening, DO;  [facility];  Service: Endoscopy;;   BIOPSY  06/29/2023   Procedure: BIOPSY;  Surgeon: Vinetta Greening, DO;  [facility];  Service: Endoscopy;;   COLONOSCOPY WITH PROPOFOL N/A 07/18/2021   Carver: 15 millimeter polyp removed from the sigmoid colon, 5 mm polyp removed from sigmoid colon.  Nonbleeding internal hemorrhoids.  Significant looping of the colon. sigmoid path showed invasive colonic adenocarcinoma involving tubular adenoma (invades to depth of 2mm, carcinoma 1mm from margin, no lymphovascular invasion, no poorly differentiated component.   COLONOSCOPY WITH PROPOFOL N/A 06/29/2023   Procedure: COLONOSCOPY WITH PROPOFOL;  Surgeon: Vinetta Greening, DO;  [facility];  Service: Endoscopy;  Laterality: N/A;  7:30 am, asa 3   CORONARY ARTERY BYPASS GRAFT N/A 03/13/2020   Procedure: CORONARY ARTERY BYPASS GRAFTING (CABG) times five using bilateral Internal mammary arteries and left radial artery;  Surgeon:  Rudine Cos, MD;  Location: [facility] OR;  Service: Open Heart Surgery;  Laterality: N/A;   DENTAL SURGERY     ESOPHAGOGASTRODUODENOSCOPY (EGD) WITH PROPOFOL N/A 07/18/2021   Carver: 1 gastric polyp status post biopsy, gastritis. gastric bx with slight chronic inflammation and no H.pyori. GEJ polypectomy with mild inflammation only   FLEXIBLE SIGMOIDOSCOPY N/A 08/26/2021   Carver: Nonbleeding internal hemorrhoids.  15 mm ulcers from previous polypectomy found in the rectum.  No evidence of previous polyp.  Located 5 to 8 cm from anal verge.   FLEXIBLE SIGMOIDOSCOPY N/A 01/09/2022   Procedure: FLEXIBLE SIGMOIDOSCOPY;  Surgeon: Vinetta Greening, DO;  [facility];  Service: Endoscopy;  Laterality: N/A;   IRRIGATION AND DEBRIDEMENT ELBOW Left 02/20/2024   Procedure: LEFT ELBOW IRRIGATION AND DEBRIDEMENT;  Surgeon: Marilee Showers, MD;  Location: [facility] OR;  Service: Orthopedics;  Laterality: Left;   POLYPECTOMY  07/18/2021    Procedure: POLYPECTOMY INTESTINAL;  Surgeon: Vinetta Greening, DO;  [facility];  Service: Endoscopy;;   POLYPECTOMY  06/29/2023   Procedure: POLYPECTOMY INTESTINAL;  Surgeon: Vinetta Greening, DO;  [facility];  Service: Endoscopy;;   RADIAL ARTERY HARVEST Left 03/13/2020   Procedure: RADIAL ARTERY HARVEST,;  Surgeon: Rudine Cos, MD;  Location: [facility] OR;  Service: Open Heart Surgery;  Laterality: Left;   RIGHT/LEFT HEART CATH AND CORONARY ANGIOGRAPHY N/A 03/07/2020   Procedure: RIGHT/LEFT HEART CATH AND CORONARY ANGIOGRAPHY;  Surgeon: Arnoldo Lapping, MD;  Location: [facility] INVASIVE CV LAB;  Service: Cardiovascular;  Laterality: N/A;   STUMP REVISION Right 12/17/2022   Procedure: RIGHT BELOW KNEE AMPUTATION REVISION;  Surgeon: Timothy Ford, MD;  Location: [facility] OR;  Service: Orthopedics;  Laterality: Right;   SUBMUCOSAL LIFTING INJECTION  01/09/2022   Procedure: SUBMUCOSAL LIFTING INJECTION;  Surgeon: Vinetta Greening, DO;  [facility];  Service: Endoscopy;;   SUBMUCOSAL TATTOO INJECTION  01/09/2022   Procedure: SUBMUCOSAL TATTOO INJECTION;  Surgeon: Vinetta Greening, DO;  [facility];  Service: Endoscopy;;   TEE WITHOUT CARDIOVERSION N/A 03/13/2020   Procedure: TRANSESOPHAGEAL ECHOCARDIOGRAM (TEE);  Surgeon: Rudine Cos, MD;  Location[facility] OR;  Service: Open Heart Surgery;  Laterality: N/A;    Scheduled Meds:  aspirin EC  81 mg Oral Q breakfast   atorvastatin  40 mg Oral QHS   carvedilol  25 mg Oral BID WC   enoxaparin (LOVENOX) injection  40 mg Subcutaneous Q24H   gabapentin  300 mg Oral BID   hydrALAZINE  100 mg Oral BID   insulin aspart  0-9 Units Subcutaneous TID WC   levothyroxine  75 mcg Oral QAC breakfast   mometasone-formoterol  2 puff Inhalation BID   pantoprazole  40 mg Oral Daily   polyvinyl alcohol  1 drop Both Eyes Daily   sodium chloride flush  3 mL Intravenous Q12H   tamsulosin  0.8 mg Oral QPM   torsemide  40 mg Oral BID    traZODone  50 mg Oral QHS   Continuous Infusions:  [START ON 02/22/2024] vancomycin     PRN Meds:.acetaminophen **OR** acetaminophen, albuterol, HYDROcodone-acetaminophen, HYDROmorphone (DILAUDID) injection, hydrOXYzine, polyethylene glycol, sorbitol  Allergies  Allergen Reactions   Ceftriaxone Anaphylaxis   Ace Inhibitors Swelling and Cough   Chlorhexidine    Haloperidol And Related     Do NOT give anti-psychotics due to risk of  Torsades and QT prolongation (per Dr. Daphane Dynes request)   Other Itching    Court Distance  soap   Pork Allergy Other (See Comments)    Not listed in MAR   Shellfish Allergy     Listed on MAR   Penicillins Itching and Rash    Tolerated ancef (12-17-22)   Social History   Socioeconomic History   Marital status: Single    Spouse name: Not on file   Number of children: Not on file   Years of education: Not on file   Highest education level: Not on file  Occupational History   Not on file  Tobacco Use   Smoking status: Former    Current packs/day: 0.00    Types: Cigarettes    Quit date: 05/25/1995    Years since quitting: 28.7   Smokeless tobacco: Never  Vaping Use   Vaping status: Never Used  Substance and Sexual Activity   Alcohol use: Not Currently    Comment: rarely   Drug use: No   Sexual activity: Not Currently  Other Topics Concern   Not on file  Social History Narrative   Not on file   Social Drivers of Health   Financial Resource Strain: Not on file  Food Insecurity: No Food Insecurity (02/21/2024)   Hunger Vital Sign    Worried About Running Out of Food in the Last Year: Never true    Ran Out of Food in the Last Year: Never true  Transportation Needs: No Transportation Needs (02/21/2024)   PRAPARE - Administrator, Civil Service (Medical): No    Lack of Transportation (Non-Medical): No  Physical Activity: Not on file  Stress: Not on file  Social Connections: Not on file  Intimate Partner Violence: Not At Risk  (02/21/2024)   Humiliation, Afraid, Rape, and Kick questionnaire    Fear of Current or Ex-Partner: No    Emotionally Abused: No    Physically Abused: No    Sexually Abused: No   Family History  Problem Relation Age of Onset   Hypertension Mother     Vitals BP (!) 117/54 (BP Location: Right Arm)   Pulse (!) 55   Temp 98 F (36.7 C) (Oral)   Resp 18   Ht 5\' 10"  (1.778 m)   Wt 106.1 kg   SpO2 99%   BMI 33.58 kg/m    Physical Exam Constitutional: Adult male sitting in the bed, nontoxic appearing, comfortable    Comments: HEENT WNL  Cardiovascular:     Rate and Rhythm: Normal rate and regular rhythm.     Heart sounds: S1 and S2  Pulmonary:     Effort: Pulmonary effort is normal.     Comments: Normal breath sounds  Abdominal:     Palpations: Abdomen is soft.     Tenderness: Not there are nondistended  Musculoskeletal:        General: No swelling or tenderness in other peripheral joints.  Left elbow is wrapped in a surgical bandage, he has difficulty with both active and passive range of motion due to pain, bandages C/D/I no surrounding cellulitis Right BKA site with no wounds or signs of infection  Skin:    Comments: No rashes  Neurological:     General: Awake, alert and oriented, grossly nonfocal moves all extremities equally  Psychiatric:        Mood and Affect: Mood normal.    Pertinent Microbiology Results for orders placed or performed during the hospital encounter of 02/19/24  Body fluid culture w Gram Stain     Status: None (Preliminary result)  Collection Time: 02/20/24  2:28 AM   Specimen: Synovium; Body Fluid  Result Value Ref Range Status   Specimen Description   Final    SYNOVIAL Performed at [facility], [address] [place], [place] [address]    Special Requests   Final    NONE Performed at [facility], [address] [place], [place] [address]    Gram Stain   Final    ABUNDANT WBC PRESENT, PREDOMINANTLY PMN NO ORGANISMS  SEEN Performed at [facility], [address], [place], [place] [address]    Culture PENDING  Incomplete   Report Status PENDING  Incomplete  Aerobic/Anaerobic Culture w Gram Stain (surgical/deep wound)     Status: None (Preliminary result)   Collection Time: 02/20/24 12:07 PM   Specimen: Path fluid; Body Fluid  Result Value Ref Range Status   Specimen Description WOUND  Final   Special Requests LEFT ELBOW  Final   Gram Stain   Final    RARE WBC SEEN NO ORGANISMS SEEN Performed at [facility], [address], [place], [place] [address]    Culture PENDING  Incomplete   Report Status PENDING  Incomplete    Pertinent Lab seen by me:    Latest Ref Rng & Units 02/20/2024    3:30 PM 02/19/2024   11:34 PM 01/07/2024   11:54 AM  CBC  WBC 4.0 - 10.5 K/uL 12.0  11.8  11.1   Hemoglobin 13.0 - 17.0 g/dL 69.6  29.5  28.4   Hematocrit 39.0 - 52.0 % 32.5  31.1  32.6   Platelets 150 - 400 K/uL 241  213  234       Latest Ref Rng & Units 02/20/2024    4:59 PM 02/19/2024   11:34 PM 01/07/2024   11:54 AM  CMP  Glucose 70 - 99 mg/dL 132  440  102   BUN 8 - 23 mg/dL 69  73  67   Creatinine 0.61 - 1.24 mg/dL 7.25  3.66  4.40   Sodium 135 - 145 mmol/L 136  135  135   Potassium 3.5 - 5.1 mmol/L 3.6  4.0  3.6   Chloride 98 - 111 mmol/L 99  97  99   CO2 22 - 32 mmol/L 25  23  24    Calcium 8.9 - 10.3 mg/dL 7.6  7.9  7.4   Total Protein 6.5 - 8.1 g/dL 6.8     Total Bilirubin 0.0 - 1.2 mg/dL 1.0     Alkaline Phos 38 - 126 U/L 72     AST 15 - 41 U/L 13     ALT 0 - 44 U/L 8        Pertinent Imagings/Other Imagings Plain films and CT images have been personally visualized and interpreted; radiology reports have been reviewed. Decision making incorporated into the Impression / Recommendations.  DG Elbow Complete Left Result Date: 02/20/2024 CLINICAL DATA:  Pain EXAM: LEFT ELBOW - COMPLETE 3+ VIEW COMPARISON:  None Available. FINDINGS: Mild spurring in the elbow joint. No acute bony  abnormality. Specifically, no fracture, subluxation, or dislocation. Left elbow joint effusion. IMPRESSION: Degenerative changes with elbow joint effusion. No visible fracture. If there is clinical concern for occult fracture, consider immobilization and repeat imaging in 1 week if symptoms persist. Electronically Signed   By: Janeece Mechanic M.D.   On: 02/20/2024 00:05    I have personally spent 87 minutes involved in face-to-face and non-face-to-face activities for this patient  on the day of the visit. Professional time spent includes the following activities: Preparing to see the patient (review of tests), Obtaining and/or reviewing separately obtained history (admission/discharge record), Performing a medically appropriate examination and/or evaluation , Ordering medications/tests/procedures, referring and communicating with other health care professionals, Documenting clinical information in the EMR, Independently interpreting results (not separately reported), Communicating results to the patient/family/caregiver, Counseling and educating the patient/family/caregiver and Care coordination (not separately reported).  Electronically signed by:   Plan d/w requesting provider as well as ID pharm D  Of note, portions of this note may have been created with voice recognition software. While this note has been edited for accuracy, occasional wrong-word or 'sound-a-like' substitutions may have occurred due to the inherent limitations of voice recognition software.   Terre Ferri, MD Infectious Disease Physician [facility] Pager: [phone]

## 2024-02-21 NOTE — Progress Notes (Signed)
 Orthopaedic Surgery Progress Note  Continues to have some elbow pain  Exam: Left Upper Extremity: Dressings clean dry and intact No pain with short arc ROM of the elbow SILT Ax/rad/med/ulnar + motor deltoid/grip/EPL/IO Palpable radial pulse   Assessment: 63 y.o. male 1 Day Post-Op status post left elbow irrigation and debridement for septic arthritis  Plan: Okay for activities as tolerated with the left elbow Maintain dressing until first postop visit or place a regular bandage if needed Okay for DVT chemoprophylaxis OR and aspiration cultures pending - no growth to date Antibiotics per primary or infectious diseases Discharge instructions placed in Epic

## 2024-02-21 NOTE — Progress Notes (Signed)
 Patient called staff stating that he was complaining of shortness of breathe and a "ball in his chest" Vitals obtained, patient maintaining saturations at 95% on his baseline oxygen. Dr. Sandria Cruise and [facility] nurse called.

## 2024-02-21 NOTE — Progress Notes (Signed)
   02/21/24 2354  BiPAP/CPAP/SIPAP  Reason BIPAP/CPAP not in use Non-compliant (Pt states he does not wear Cpap and does not wish to do so at this time. Advised to call if he changes his mind)

## 2024-02-21 NOTE — Progress Notes (Signed)
 PROGRESS NOTE    Shane Chambers  ZOX:[id number] DOB: 1961/04/06 DOA: 02/19/2024 PCP: [name], MD  Outpatient Specialists:     Brief Narrative:  Patient is a 63 year old male with past medical history significant for hypertension, hyperlipidemia, GERD, CKD 4, CAD status post CABG, COPD, hypothyroidism, diabetes, aortic stenosis, chronic diastolic CHF, anemia, BPH, colon cancer, depression, obesity, OSA, QT prolongation, and status post right BKA.  Patient was admitted with septic left elbow joint.  Patient underwent left elbow irrigation and debridement by the orthopedic team on 02/20/2024.  Patient is currently on IV vancomycin.  Patient received a dose of IV Rocephin, but may have developed allergic reaction to Rocephin.  Infectious disease input is highly appreciated.  IV Cipro is being considered.  There will be need to monitor QTc interval.  02/21/2024: Patient reported shortness of breath, difficulty swallowing and feeling of a ball on his chest after completing his dose of Rocephin.  Patient was seen alongside patient's nurse and rapid response team.  Stat EKG, troponin and ABG ordered.  IV Solu-Medrol and Benadryl ordered.  Patient will be transferred to progressive care unit.  Also discussed with pharmacy team.  Rocephin will be changed to IV Cipro.  Infectious disease teams input is highly appreciated.  Leukocytosis is improving.  WBC is down from 12-9.9.   Assessment & Plan:   Principal Problem:   Septic joint (HCC) Active Problems:   Essential hypertension   Depression   BPH (benign prostatic hyperplasia)   Coronary artery disease   S/P CABG x 5   COPD (chronic obstructive pulmonary disease) (HCC)   OSA (obstructive sleep apnea)   Cancer of sigmoid colon (HCC)   GERD without esophagitis   Morbid obesity (HCC)   QT prolongation   S/P BKA (below knee amputation) unilateral, right (HCC)   Dyslipidemia   Hypothyroidism   CKD stage 3a, GFR 45-59 ml/min (HCC)   Moderate  aortic stenosis   Type II diabetes mellitus with renal manifestations (HCC)   Chronic diastolic CHF (congestive heart failure) (HCC)   Anemia of chronic renal failure   Septic joint > Presented with elbow pain radiating to the arm and pain with range of motion. > Cell count of synovial fluid showing 50,000 WBCs.  Gram stain and culture pending.  Operative cultures also pending. > ESR 55, CRP 2.8.  Leukocytosis to 11.8.  Afebrile. > Orthopedic performed washout (irrigation and debridement of left elbow joint) -Patient is on IV daptomycin and Cipro.   -Follow cultures. -Input from infectious disease team is highly appreciated.   Hypertension > Some low BP post-op, received albumin in PACU -Continue to monitor closely.  Low normal blood pressure.   Hyperlipidemia - Continue home atorvastatin   GERD - Continue home PPI   CKD 4: > Creatinine stable at 2.44 in the ED. -Stable.   CAD -Stable.  COPD -Stable. -Continue Dulera. -Nebs albuterol as needed.   Hypothyroidism - Continue home Synthroid   Diabetes - SSI   AS Chronic diastolic CHF > Last echo in December 2024 with EF 55-60%, G3 DD, mildly reduced RV function, only mild aortic stenosis. -Stable.   Anemia > Stable at 10.3   BPH Neurogenic Bladder - Continue home tamsulosin - Has suprapubic catheter placement planned for 4/14, may need to coordinate this if he is still here.   Depression - Continue home hydroxyzine and trazodone   Obesity - Noted   OSA - Continue home CPAP   QT prolongation - Check EKG in  a.m., especially as patient is not on IV Cipro - Avoid/minimize QT prolonging medications   History of colon cancer Status post right BKA - Noted   DVT prophylaxis: Subcutaneous Lovenox Code Status: Full code Family Communication:  Disposition Plan: Patient remains inpatient.  Patient be transferred to progressive unit.   Consultants:  Orthopedic surgery. Infectious disease  Procedures:   Irrigation and debridement of left elbow joint (reason: Septic arthritis).  Antimicrobials:  IV vancomycin. IV Cipro   Subjective: -Reported shortness of breath and difficulty swallowing following IV Rocephin injection.  Objective: Vitals:   02/21/24 0726 02/21/24 1318 02/21/24 1345 02/21/24 1609  BP: (!) 117/54 (!) 115/39 (!) 132/59 (!) 148/65  Pulse: (!) 55 (!) 56    Resp: 18 18 18 13   Temp: 98 F (36.7 C)  97.6 F (36.4 C) 97.9 F (36.6 C)  TempSrc: Oral  Oral Oral  SpO2: 99% 96% 98% 97%  Weight:      Height:        Intake/Output Summary (Last 24 hours) at 02/21/2024 1711 Last data filed at 02/21/2024 0500 Gross per 24 hour  Intake 450 ml  Output 700 ml  Net -250 ml   Filed Weights   02/19/24 2224 02/20/24 1110  Weight: 106.1 kg 106.1 kg    Examination:  General exam: Appears calm and comfortable.  Patient is obese.  Patient maintains O2 sat.  No swelling of the lips or tongue. Respiratory system: Decreased air entry. Cardiovascular system: S1 & S2, with ejection systolic murmur. Gastrointestinal system: Abdomen is obese, soft and nontender. Central nervous system: Awake and alert.   Extremities: Status post right BKA.   Data Reviewed: I have personally reviewed following labs and imaging studies  CBC: Recent Labs  Lab 02/19/24 2334 02/20/24 1530 02/21/24 0854  WBC 11.8* 12.0* 9.9  NEUTROABS 9.1*  --   --   HGB 10.3* 10.8* 9.7*  HCT 31.1* 32.5* 30.5*  MCV 88.9 89.3 91.9  PLT 213 241 178   Basic Metabolic Panel: Recent Labs  Lab 02/19/24 2334 02/20/24 1659 02/21/24 0854  NA 135 136 137  K 4.0 3.6 4.0  CL 97* 99 101  CO2 23 25 19*  GLUCOSE 140* 122* 97  BUN 73* 69* 76*  CREATININE 2.44* 2.44* 2.75*  CALCIUM 7.9* 7.6* 7.6*   GFR: Estimated Creatinine Clearance: 34.4 mL/min (A) (by C-G formula based on SCr of 2.75 mg/dL (H)). Liver Function Tests: Recent Labs  Lab 02/20/24 1659 02/21/24 0854  AST 13* 14*  ALT 8 7  ALKPHOS 72 66   BILITOT 1.0 0.8  PROT 6.8 7.1  ALBUMIN 3.3* 3.4*   No results for input(s): "LIPASE", "AMYLASE" in the last 168 hours. No results for input(s): "AMMONIA" in the last 168 hours. Coagulation Profile: No results for input(s): "INR", "PROTIME" in the last 168 hours. Cardiac Enzymes: No results for input(s): "CKTOTAL", "CKMB", "CKMBINDEX", "TROPONINI" in the last 168 hours. BNP (last 3 results) No results for input(s): "PROBNP" in the last 8760 hours. HbA1C: No results for input(s): "HGBA1C" in the last 72 hours. CBG: Recent Labs  Lab 02/20/24 1226 02/20/24 1658 02/21/24 0757 02/21/24 1243 02/21/24 1710  GLUCAP 141* 118* 104* 154* 140*   Lipid Profile: No results for input(s): "CHOL", "HDL", "LDLCALC", "TRIG", "CHOLHDL", "LDLDIRECT" in the last 72 hours. Thyroid Function Tests: No results for input(s): "TSH", "T4TOTAL", "FREET4", "T3FREE", "THYROIDAB" in the last 72 hours. Anemia Panel: No results for input(s): "VITAMINB12", "FOLATE", "FERRITIN", "TIBC", "IRON", "RETICCTPCT" in the last  72 hours. Urine analysis:    Component Value Date/Time   COLORURINE YELLOW (A) 07/30/2023 1650   APPEARANCEUR CLEAR (A) 07/30/2023 1650   LABSPEC 1.011 07/30/2023 1650   PHURINE 5.0 07/30/2023 1650   GLUCOSEU NEGATIVE 07/30/2023 1650   HGBUR NEGATIVE 07/30/2023 1650   BILIRUBINUR NEGATIVE 07/30/2023 1650   KETONESUR NEGATIVE 07/30/2023 1650   PROTEINUR 100 (A) 07/30/2023 1650   NITRITE NEGATIVE 07/30/2023 1650   LEUKOCYTESUR NEGATIVE 07/30/2023 1650   Sepsis Labs: @LABRCNTIP (procalcitonin:4,lacticidven:4)  ) Recent Results (from the past 240 hours)  Body fluid culture w Gram Stain     Status: None (Preliminary result)   Collection Time: 02/20/24  2:28 AM   Specimen: Synovium; Body Fluid  Result Value Ref Range Status   Specimen Description   Final    SYNOVIAL Performed at [facility], [address] [place], [place] [address]    Special Requests   Final    NONE Performed at  [facility], [address], [place], [place] [address]    Gram Stain   Final    ABUNDANT WBC PRESENT, PREDOMINANTLY PMN NO ORGANISMS SEEN    Culture   Final    NO GROWTH 1 DAY Performed at [facility], [address], [place], [place] [address]    Report Status PENDING  Incomplete  Aerobic/Anaerobic Culture w Gram Stain (surgical/deep wound)     Status: None (Preliminary result)   Collection Time: 02/20/24 12:07 PM   Specimen: Path fluid; Body Fluid  Result Value Ref Range Status   Specimen Description WOUND  Final   Special Requests LEFT ELBOW  Final   Gram Stain RARE WBC SEEN NO ORGANISMS SEEN   Final   Culture   Final    NO GROWTH < 24 HOURS Performed at [facility], [address], [place], [place] [address]    Report Status PENDING  Incomplete  Culture, blood (Routine X 2) w Reflex to ID Panel     Status: None (Preliminary result)   Collection Time: 02/20/24  3:30 PM   Specimen: BLOOD RIGHT FOREARM  Result Value Ref Range Status   Specimen Description BLOOD RIGHT FOREARM  Final   Special Requests   Final    BOTTLES DRAWN AEROBIC AND ANAEROBIC Blood Culture results may not be optimal due to an inadequate volume of blood received in culture bottles   Culture   Final    NO GROWTH < 24 HOURS Performed at [facility], [address] [place], [place] [address]    Report Status PENDING  Incomplete  Culture, blood (Routine X 2) w Reflex to ID Panel     Status: None (Preliminary result)   Collection Time: 02/20/24  4:59 PM   Specimen: BLOOD  Result Value Ref Range Status   Specimen Description BLOOD SITE NOT SPECIFIED  Final   Special Requests Blood Culture adequate volume  Final   Culture   Final    NO GROWTH < 24 HOURS Performed at [facility], [address] [place], [place] [address]    Report Status PENDING  Incomplete         Radiology Studies: DG Elbow Complete Left Result Date: 02/20/2024 CLINICAL DATA:  Pain EXAM: LEFT ELBOW  - COMPLETE 3+ VIEW COMPARISON:  None Available. FINDINGS: Mild spurring in the elbow joint. No acute bony abnormality. Specifically, no fracture, subluxation, or dislocation. Left elbow joint effusion. IMPRESSION: Degenerative changes with elbow joint effusion. No visible fracture. If  there is clinical concern for occult fracture, consider immobilization and repeat imaging in 1 week if symptoms persist. Electronically Signed   By: Janeece Mechanic M.D.   On: 02/20/2024 00:05        Scheduled Meds:  aspirin EC  81 mg Oral Q breakfast   atorvastatin  40 mg Oral QHS   carvedilol  25 mg Oral BID WC   enoxaparin (LOVENOX) injection  40 mg Subcutaneous Q24H   gabapentin  300 mg Oral BID   hydrALAZINE  100 mg Oral BID   insulin aspart  0-9 Units Subcutaneous TID WC   ipratropium-albuterol  3 mL Nebulization Q6H   levothyroxine  75 mcg Oral QAC breakfast   mometasone-formoterol  2 puff Inhalation BID   pantoprazole  40 mg Oral Daily   polyvinyl alcohol  1 drop Both Eyes Daily   sodium chloride flush  3 mL Intravenous Q12H   tamsulosin  0.8 mg Oral QPM   torsemide  40 mg Oral BID   traZODone  50 mg Oral QHS   Continuous Infusions:  [START ON 02/22/2024] ciprofloxacin     [START ON 02/22/2024] DAPTOmycin       LOS: 1 day    Time spent: 55 minutes.    Fonnie Iba, MD  [facility] Pager #: [phone] 7PM-7AM contact night coverage as above

## 2024-02-21 NOTE — Significant Event (Addendum)
 Rapid Response Event Note   Reason for Call :  Tension in chest, drowsy  Initial Focused Assessment:  Pt drowsy, but stays engaged during evaluation. Oriented. Skin warm, pink, dry. Clear breath sounds. Breathing is regular, unlabored. Murmur. Heart rate regular. Abdomen is round, soft. Positive bowel sounds. Pt endorses discomfort with mid-epigastric palpation. He complains of feeling like there is a ball in his chest.   VS: T 97.22F, BP 132/59, HR 62, RR 18, SpO2 98% on 4LNC  Interventions:  Per MD:  -Concern for Rocephin allergy: Benadryl and Solu-Medrol ordered -EKG, ABG, Trop, D-Dimer  Plan of Care:  -Transfer to PCU  Event Summary:  MD Notified: Dr. Sandria Cruise, at bedside Call Time: 1328 Arrival Time: 1332 End Time: 1430  Washington Hacker, RN

## 2024-02-21 NOTE — Progress Notes (Signed)
 Pharmacy Antibiotic Note  Shane Chambers is a 63 y.o. male admitted on 02/19/2024 with  septic arthritis .  Pharmacy has been consulted for Daptomycin and Ceftriaxone dosing.  ID consulted. Transition from Vancomycin to empiric daptomycin and ceftriaxone.  Plan: Stop Vancomycin Start Daptomycin 700 mg IV daily (8 mg/kg adjusted body weight, BMI >30) Start Ceftriaxone 2 g daily Monitor renal function - close to q48h cutoff for Daptomycin, Monitor CK weekly   Height: 5\' 10"  (177.8 cm) Weight: 106.1 kg (234 lb) IBW/kg (Calculated) : 73  Temp (24hrs), Avg:97.5 F (36.4 C), Min:96.9 F (36.1 C), Max:98 F (36.7 C)  Recent Labs  Lab 02/19/24 2334 02/20/24 1530 02/20/24 1659 02/21/24 0854  WBC 11.8* 12.0*  --  9.9  CREATININE 2.44*  --  2.44* 2.75*    Estimated Creatinine Clearance: 34.4 mL/min (A) (by C-G formula based on SCr of 2.75 mg/dL (H)).    Allergies  Allergen Reactions   Ceftriaxone Anaphylaxis   Ace Inhibitors Swelling and Cough   Chlorhexidine    Haloperidol And Related     Do NOT give anti-psychotics due to risk of  Torsades and QT prolongation (per Dr. Daphane Dynes request)   Other Itching    [name]   Pork Allergy Other (See Comments)    Not listed in [facility]   Shellfish Allergy     Listed on MAR   Penicillins Itching and Rash    Tolerated ancef (12-17-22)    Antimicrobials this admission: Vancomycin 4/12 >> 4/13 Daptomycin 4/13 > Ceftriaxone 4/13 >   Dose adjustments this admission: None  Microbiology results: 4/12 Body fluid Cx: ngtd  Thank you for allowing pharmacy to be a part of this patient's care.  Jennett Model Branko Steeves 02/21/2024 11:24 AM

## 2024-02-22 ENCOUNTER — Other Ambulatory Visit: Payer: Self-pay

## 2024-02-22 DIAGNOSIS — M00822 Arthritis due to other bacteria, left elbow: Secondary | ICD-10-CM | POA: Diagnosis not present

## 2024-02-22 DIAGNOSIS — Z881 Allergy status to other antibiotic agents status: Secondary | ICD-10-CM

## 2024-02-22 DIAGNOSIS — Z89511 Acquired absence of right leg below knee: Secondary | ICD-10-CM | POA: Diagnosis not present

## 2024-02-22 DIAGNOSIS — G4733 Obstructive sleep apnea (adult) (pediatric): Secondary | ICD-10-CM | POA: Diagnosis not present

## 2024-02-22 DIAGNOSIS — N1831 Chronic kidney disease, stage 3a: Secondary | ICD-10-CM | POA: Diagnosis not present

## 2024-02-22 DIAGNOSIS — R9431 Abnormal electrocardiogram [ECG] [EKG]: Secondary | ICD-10-CM | POA: Diagnosis not present

## 2024-02-22 DIAGNOSIS — M009 Pyogenic arthritis, unspecified: Secondary | ICD-10-CM | POA: Diagnosis not present

## 2024-02-22 LAB — CBC WITH DIFFERENTIAL/PLATELET
Abs Immature Granulocytes: 0.07 10*3/uL (ref 0.00–0.07)
Basophils Absolute: 0 10*3/uL (ref 0.0–0.1)
Basophils Relative: 0 %
Eosinophils Absolute: 0 10*3/uL (ref 0.0–0.5)
Eosinophils Relative: 0 %
HCT: 30.3 % — ABNORMAL LOW (ref 39.0–52.0)
Hemoglobin: 10.1 g/dL — ABNORMAL LOW (ref 13.0–17.0)
Immature Granulocytes: 1 %
Lymphocytes Relative: 4 %
Lymphs Abs: 0.4 10*3/uL — ABNORMAL LOW (ref 0.7–4.0)
MCH: 29.6 pg (ref 26.0–34.0)
MCHC: 33.3 g/dL (ref 30.0–36.0)
MCV: 88.9 fL (ref 80.0–100.0)
Monocytes Absolute: 0.7 10*3/uL (ref 0.1–1.0)
Monocytes Relative: 8 %
Neutro Abs: 8.5 10*3/uL — ABNORMAL HIGH (ref 1.7–7.7)
Neutrophils Relative %: 87 %
Platelets: 231 10*3/uL (ref 150–400)
RBC: 3.41 MIL/uL — ABNORMAL LOW (ref 4.22–5.81)
RDW: 12.7 % (ref 11.5–15.5)
WBC: 9.8 10*3/uL (ref 4.0–10.5)
nRBC: 0 % (ref 0.0–0.2)

## 2024-02-22 LAB — RENAL FUNCTION PANEL
Albumin: 3.5 g/dL (ref 3.5–5.0)
Anion gap: 16 — ABNORMAL HIGH (ref 5–15)
BUN: 76 mg/dL — ABNORMAL HIGH (ref 8–23)
CO2: 25 mmol/L (ref 22–32)
Calcium: 8.3 mg/dL — ABNORMAL LOW (ref 8.9–10.3)
Chloride: 94 mmol/L — ABNORMAL LOW (ref 98–111)
Creatinine, Ser: 2.4 mg/dL — ABNORMAL HIGH (ref 0.61–1.24)
GFR, Estimated: 30 mL/min — ABNORMAL LOW (ref >60)
Glucose, Bld: 164 mg/dL — ABNORMAL HIGH (ref 70–99)
Phosphorus: 5.2 mg/dL — ABNORMAL HIGH (ref 2.5–4.6)
Potassium: 4.4 mmol/L (ref 3.5–5.1)
Sodium: 135 mmol/L (ref 135–145)

## 2024-02-22 LAB — GLUCOSE, CAPILLARY
Glucose-Capillary: 215 mg/dL — ABNORMAL HIGH (ref 70–99)
Glucose-Capillary: 180 mg/dL — ABNORMAL HIGH (ref 70–99)
Glucose-Capillary: 184 mg/dL — ABNORMAL HIGH (ref 70–99)
Glucose-Capillary: 160 mg/dL — ABNORMAL HIGH (ref 70–99)

## 2024-02-22 LAB — MAGNESIUM: Magnesium: 1.1 mg/dL — ABNORMAL LOW (ref 1.7–2.4)

## 2024-02-22 LAB — CK: Total CK: 144 U/L (ref 49–397)

## 2024-02-22 MED ORDER — MAGNESIUM SULFATE 4 GM/100ML IV SOLN
4.0000 g | Freq: Once | INTRAVENOUS | Status: AC
  Administered 2024-02-22: 4 g via INTRAVENOUS
  Filled 2024-02-22: qty 100

## 2024-02-22 NOTE — Plan of Care (Signed)

## 2024-02-22 NOTE — Progress Notes (Signed)
 PHARMACY CONSULT NOTE FOR:  OUTPATIENT  PARENTERAL ANTIBIOTIC THERAPY (OPAT)  Indication: Septic joint Regimen: Daptomycin 700mg  IV q24h End date: 03/31/24  IV antibiotic discharge orders are pended. To discharging provider:  please sign these orders via discharge navigator,  Select New Orders & click on the button choice - Manage This Unsigned Work.     Thank you for allowing pharmacy to be a part of this patient's care.  Baxter Limber, PharmD Clinical Pharmacist **Pharmacist phone directory can now be found on [email or web address] ([email or web address]).  Listed under [facility].

## 2024-02-22 NOTE — Progress Notes (Signed)
 PROGRESS NOTE    Shane Chambers  GNF:[id number] DOB: [birth year] DOA: 02/19/2024 PCP: [name], MD  Outpatient Specialists:     Brief Narrative:  Patient is a 63 year old male with past medical history significant for hypertension, hyperlipidemia, GERD, CKD 4, CAD status post CABG, COPD, hypothyroidism, diabetes, aortic stenosis, chronic diastolic CHF, anemia, BPH, colon cancer, depression, obesity, OSA, QT prolongation, and status post right BKA.  Patient was admitted with septic left elbow joint.  Patient underwent left elbow irrigation and debridement by the orthopedic team on 02/20/2024.  Patient was initially on IV vancomycin and ceftriaxone, but developed allergic reaction to ceftriaxone.  Patient is not on daptomycin and Cipro.  Infectious disease input is appreciated.    02/21/2024: Patient reported shortness of breath, difficulty swallowing and feeling of a ball on his chest after completing his dose of Rocephin.  Patient was seen alongside patient's nurse and rapid response team.  Stat EKG, troponin and ABG ordered.  IV Solu-Medrol and Benadryl ordered.  Patient will be transferred to progressive care unit.  Also discussed with pharmacy team.  Rocephin will be changed to IV Cipro.  Infectious disease teams input is highly appreciated.  Leukocytosis is improving.  WBC is down from 12-9.9.  02/22/2024: Patient seen.  No new complaints.  Allergic reaction has resolved significantly. EKG done earlier today (02/22/2024) revealed QTc interval of 489 ms.  Magnesium is 1.1, phosphorus of 5.2.  Will replace magnesium.  Will consider diabetic/renal diet.  Will monitor phosphorus closely.  Patient has CKD 3B/4.  WBC is 9.8 today.  Hemoglobin of 10.1 g/dL.  Cultures are still pending.  Nasal swab was positive for MRSA.   Assessment & Plan:   Principal Problem:   Septic joint (HCC) Active Problems:   Essential hypertension   Depression   BPH (benign prostatic hyperplasia)   Coronary artery  disease   S/P CABG x 5   COPD (chronic obstructive pulmonary disease) (HCC)   OSA (obstructive sleep apnea)   Cancer of sigmoid colon (HCC)   GERD without esophagitis   Morbid obesity (HCC)   QT prolongation   S/P BKA (below knee amputation) unilateral, right (HCC)   Dyslipidemia   Hypothyroidism   CKD stage 3a, GFR 45-59 ml/min (HCC)   Moderate aortic stenosis   Type II diabetes mellitus with renal manifestations (HCC)   Chronic diastolic CHF (congestive heart failure) (HCC)   Anemia of chronic renal failure   Septic left elbow joint -Patient presented with left elbow pain. - Cell count of synovial fluid showing 50,000 WBCs.  Gram stain and culture pending.  Operative cultures also pending. -ESR 55, CRP 2.8.  Leukocytosis to 11.8.  Afebrile. -Orthopedic performed washout (irrigation and debridement of left elbow joint) -Patient is on IV daptomycin and Cipro.   -Follow cultures. -Input from infectious disease team is highly appreciated.   Hypertension -Reasonably controlled. - Continue to monitor closely.    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 -Stable. -Serum creatinine of 2.4 today.   CAD -Stable.  COPD -Stable. -Continue Dulera. -Nebs albuterol as needed.   Hypothyroidism - Continue home Synthroid   Diabetes - SSI   AS Chronic diastolic CHF > Last echo in December 2024 with EF 55-60%, G3 DD, mildly reduced RV function, only mild aortic stenosis. -Stable.   Anemia > Stable at 10.3   BPH Neurogenic Bladder - Continue home tamsulosin - Has  suprapubic catheter placement planned for 4/14, may need to coordinate this if he is still here.   Depression - Continue home hydroxyzine and trazodone   Obesity - Noted   OSA - Continue home CPAP   QT prolongation - Resolved. - QTc interval of 489 ms. - Magnesium of 1.1. - IV magnesium 4 g x 1 dose. - Renal panel  and magnesium level in the morning.  Hypomagnesemia: - IV magnesium 4 g x 1 dose. - Repeat magnesium level in the morning.  Hyperphosphatemia: -Change to renal/diabetic diet. - Follow phosphorus level. - Patient has advanced renal disease.  History of colon cancer Status post right BKA - Noted   DVT prophylaxis: Subcutaneous Lovenox Code Status: Full code Family Communication:  Disposition Plan: Patient remains inpatient.  Patient be transferred to progressive unit.   Consultants:  Orthopedic surgery. Infectious disease  Procedures:  Irrigation and debridement of left elbow joint (reason: Septic arthritis).  Antimicrobials:  IV vancomycin. IV Cipro   Subjective: - Shortness of breath has resolved.  Objective: Vitals:   02/22/24 0340 02/22/24 0900 02/22/24 1230 02/22/24 1233  BP: (!) 160/68 (!) 158/67  134/68  Pulse: 65 (!) 59    Resp: 12 10  11   Temp: 98.1 F (36.7 C) 97.8 F (36.6 C) 97.9 F (36.6 C)   TempSrc: Oral Oral Oral   SpO2: 98% 98%  98%  Weight:      Height:        Intake/Output Summary (Last 24 hours) at 02/22/2024 1426 Last data filed at 02/22/2024 0939 Gross per 24 hour  Intake 340 ml  Output 1700 ml  Net -1360 ml   Filed Weights   02/19/24 2224 02/20/24 1110  Weight: 106.1 kg 106.1 kg    Examination:  General exam: Appears calm and comfortable.  Patient is obese.  Patient maintains O2 sat.  No swelling of the lips or tongue. Respiratory system: Decreased air entry. Cardiovascular system: S1 & S2, with ejection systolic murmur. Gastrointestinal system: Abdomen is obese, soft and nontender. Central nervous system: Awake and alert.   Extremities: Status post right BKA.   Data Reviewed: I have personally reviewed following labs and imaging studies  CBC: Recent Labs  Lab 02/19/24 2334 02/20/24 1530 02/21/24 0854 02/22/24 1014  WBC 11.8* 12.0* 9.9 9.8  NEUTROABS 9.1*  --   --  8.5*  HGB 10.3* 10.8* 9.7* 10.1*  HCT 31.1*  32.5* 30.5* 30.3*  MCV 88.9 89.3 91.9 88.9  PLT 213 241 178 231   Basic Metabolic Panel: Recent Labs  Lab 02/19/24 2334 02/20/24 1659 02/21/24 0854 02/22/24 1014  NA 135 136 137 135  K 4.0 3.6 4.0 4.4  CL 97* 99 101 94*  CO2 23 25 19* 25  GLUCOSE 140* 122* 97 164*  BUN 73* 69* 76* 76*  CREATININE 2.44* 2.44* 2.75* 2.40*  CALCIUM 7.9* 7.6* 7.6* 8.3*  MG  --   --   --  1.1*  PHOS  --   --   --  5.2*   GFR: Estimated Creatinine Clearance: 39.4 mL/min (A) (by C-G formula based on SCr of 2.4 mg/dL (H)). Liver Function Tests: Recent Labs  Lab 02/20/24 1659 02/21/24 0854 02/22/24 1014  AST 13* 14*  --   ALT 8 7  --   ALKPHOS 72 66  --   BILITOT 1.0 0.8  --   PROT 6.8 7.1  --   ALBUMIN 3.3* 3.4* 3.5   No results for input(s): "LIPASE", "AMYLASE" in  the last 168 hours. No results for input(s): "AMMONIA" in the last 168 hours. Coagulation Profile: No results for input(s): "INR", "PROTIME" in the last 168 hours. Cardiac Enzymes: Recent Labs  Lab 02/22/24 0355  CKTOTAL 144   BNP (last 3 results) No results for input(s): "PROBNP" in the last 8760 hours. HbA1C: No results for input(s): "HGBA1C" in the last 72 hours. CBG: Recent Labs  Lab 02/21/24 1243 02/21/24 1710 02/21/24 2054 02/22/24 0537 02/22/24 1239  GLUCAP 154* 140* 199* 215* 180*   Lipid Profile: No results for input(s): "CHOL", "HDL", "LDLCALC", "TRIG", "CHOLHDL", "LDLDIRECT" in the last 72 hours. Thyroid Function Tests: No results for input(s): "TSH", "T4TOTAL", "FREET4", "T3FREE", "THYROIDAB" in the last 72 hours. Anemia Panel: No results for input(s): "VITAMINB12", "FOLATE", "FERRITIN", "TIBC", "IRON", "RETICCTPCT" in the last 72 hours. Urine analysis:    Component Value Date/Time   COLORURINE YELLOW (A) 07/30/2023 1650   APPEARANCEUR CLEAR (A) 07/30/2023 1650   LABSPEC 1.011 07/30/2023 1650   PHURINE 5.0 07/30/2023 1650   GLUCOSEU NEGATIVE 07/30/2023 1650   HGBUR NEGATIVE 07/30/2023 1650    BILIRUBINUR NEGATIVE 07/30/2023 1650   KETONESUR NEGATIVE 07/30/2023 1650   PROTEINUR 100 (A) 07/30/2023 1650   NITRITE NEGATIVE 07/30/2023 1650   LEUKOCYTESUR NEGATIVE 07/30/2023 1650   Sepsis Labs: @LABRCNTIP (procalcitonin:4,lacticidven:4)  ) Recent Results (from the past 240 hours)  Body fluid culture w Gram Stain     Status: None (Preliminary result)   Collection Time: 02/20/24  2:28 AM   Specimen: Synovium; Body Fluid  Result Value Ref Range Status   Specimen Description   Final    SYNOVIAL Performed at [facility], [address], [place], [place] [address]    Special Requests   Final    NONE Performed at [facility], [address], [place], [place] [address]    Gram Stain   Final    ABUNDANT WBC PRESENT, PREDOMINANTLY PMN NO ORGANISMS SEEN    Culture   Final    NO GROWTH 1 DAY Performed at [facility], [address], [place], [place] [address]    Report Status PENDING  Incomplete  Aerobic/Anaerobic Culture w Gram Stain (surgical/deep wound)     Status: None (Preliminary result)   Collection Time: 02/20/24 12:07 PM   Specimen: Path fluid; Body Fluid  Result Value Ref Range Status   Specimen Description WOUND  Final   Special Requests LEFT ELBOW  Final   Gram Stain RARE WBC SEEN NO ORGANISMS SEEN   Final   Culture   Final    NO GROWTH 2 DAYS NO ANAEROBES ISOLATED; CULTURE IN PROGRESS FOR 5 DAYS Performed at [facility], [address], [place], [place] [address]    Report Status PENDING  Incomplete  Culture, blood (Routine X 2) w Reflex to ID Panel     Status: None (Preliminary result)   Collection Time: 02/20/24  3:30 PM   Specimen: BLOOD RIGHT FOREARM  Result Value Ref Range Status   Specimen Description BLOOD RIGHT FOREARM  Final   Special Requests   Final    BOTTLES DRAWN AEROBIC AND ANAEROBIC Blood Culture results may not be optimal due to an inadequate volume of blood received in culture bottles   Culture   Final    NO GROWTH 2  DAYS Performed at [facility], [address] [place], [place] [address]    Report Status PENDING  Incomplete  Culture, blood (Routine X 2) w Reflex to ID Panel  Status: None (Preliminary result)   Collection Time: 02/20/24  4:59 PM   Specimen: BLOOD  Result Value Ref Range Status   Specimen Description BLOOD SITE NOT SPECIFIED  Final   Special Requests Blood Culture adequate volume  Final   Culture   Final    NO GROWTH 2 DAYS Performed at [facility], [address] [place], [place] [address]    Report Status PENDING  Incomplete  MRSA Next Gen by PCR, Nasal     Status: Abnormal   Collection Time: 02/21/24 11:01 AM   Specimen: Nasal Mucosa; Nasal Swab  Result Value Ref Range Status   MRSA by PCR Next Gen DETECTED (A) NOT DETECTED Final    Comment: CRITICAL RESULT CALLED TO, READ BACK BY AND VERIFIED WITH: RN Lonn Roads [id number] 1832 BY J RAZZAK, MT (NOTE) The GeneXpert MRSA Assay (FDA approved for NASAL specimens only), is one component of a comprehensive MRSA colonization surveillance program. It is not intended to diagnose MRSA infection nor to guide or monitor treatment for MRSA infections. Test performance is not FDA approved in patients less than 56 years old. Performed at [facility], [address] [place], [place] [address]          Radiology Studies: No results found.       Scheduled Meds:  aspirin EC  81 mg Oral Q breakfast   atorvastatin  40 mg Oral QHS   carvedilol  25 mg Oral BID WC   enoxaparin (LOVENOX) injection  40 mg Subcutaneous Q24H   gabapentin  300 mg Oral BID   hydrALAZINE  100 mg Oral BID   insulin aspart  0-9 Units Subcutaneous TID WC   levothyroxine  75 mcg Oral QAC breakfast   lubiprostone  8 mcg Oral BID WC   mometasone-formoterol  2 puff Inhalation BID   pantoprazole  40 mg Oral Daily   polyvinyl alcohol  1 drop Both Eyes Daily   sodium chloride flush  3 mL Intravenous Q12H   tamsulosin  0.8 mg Oral QPM    torsemide  40 mg Oral BID   traZODone  50 mg Oral QHS   Continuous Infusions:  ciprofloxacin 400 mg (02/22/24 1246)   DAPTOmycin       LOS: 2 days    Time spent: 55 minutes.    Fonnie Iba, MD  [facility] Pager #: [phone] 7PM-7AM contact night coverage as above

## 2024-02-22 NOTE — Progress Notes (Signed)
 PICC order noted, per Angelia RN patient not planned for discharge today.  Made aware that PICC will be placed later this evening or tomorrow.

## 2024-02-22 NOTE — Progress Notes (Signed)
 Subjective:  He was upset about having received ceftriaxone when it was a stated adverse reaction with him having apparent difficulty with breathing (though curiously he tolerated multiple other cephalosporins in the interim)   Antibiotics:  Anti-infectives (From admission, onward)    Start     Dose/Rate Route Frequency Ordered Stop   02/22/24 1600  vancomycin (VANCOREADY) IVPB 1750 mg/350 mL  Status:  Discontinued       Placed in "Followed by" Linked Group   1,750 mg 175 mL/hr over 120 Minutes Intravenous Every 48 hours 02/20/24 1451 02/21/24 1121   02/22/24 1400  DAPTOmycin (CUBICIN) IVPB 700 mg/156mL premix        8 mg/kg  86.2 kg (Adjusted) 200 mL/hr over 30 Minutes Intravenous Daily 02/21/24 1121     02/22/24 1200  ciprofloxacin (CIPRO) tablet 500 mg  Status:  Discontinued        500 mg Oral 2 times daily 02/21/24 1450 02/21/24 1530   02/22/24 1200  ciprofloxacin (CIPRO) IVPB 400 mg        400 mg 200 mL/hr over 60 Minutes Intravenous Every 12 hours 02/21/24 1530     02/21/24 1215  cefTRIAXone (ROCEPHIN) 2 g in sodium chloride 0.9 % 100 mL IVPB  Status:  Discontinued        2 g 200 mL/hr over 30 Minutes Intravenous Every 24 hours 02/21/24 1121 02/21/24 1440   02/20/24 1600  vancomycin (VANCOCIN) IVPB 1000 mg/200 mL premix       Placed in "Followed by" Linked Group   1,000 mg 200 mL/hr over 60 Minutes Intravenous  Once 02/20/24 1451 02/20/24 1811   02/20/24 1128  vancomycin (VANCOCIN) 1-5 GM/200ML-% IVPB       Note to Pharmacy: Landry Mellow: cabinet override      02/20/24 1128 02/20/24 2344       Medications: Scheduled Meds:  aspirin EC  81 mg Oral Q breakfast   atorvastatin  40 mg Oral QHS   carvedilol  25 mg Oral BID WC   enoxaparin (LOVENOX) injection  40 mg Subcutaneous Q24H   gabapentin  300 mg Oral BID   hydrALAZINE  100 mg Oral BID   insulin aspart  0-9 Units Subcutaneous TID WC   levothyroxine  75 mcg Oral QAC breakfast   lubiprostone  8 mcg Oral  BID WC   mometasone-formoterol  2 puff Inhalation BID   pantoprazole  40 mg Oral Daily   polyvinyl alcohol  1 drop Both Eyes Daily   sodium chloride flush  3 mL Intravenous Q12H   tamsulosin  0.8 mg Oral QPM   torsemide  40 mg Oral BID   traZODone  50 mg Oral QHS   Continuous Infusions:  ciprofloxacin Stopped (02/22/24 1354)   DAPTOmycin Stopped (02/22/24 1501)   PRN Meds:.acetaminophen **OR** acetaminophen, albuterol, alum & mag hydroxide-simeth, HYDROcodone-acetaminophen, HYDROmorphone (DILAUDID) injection, hydrOXYzine, polyethylene glycol, sorbitol    Objective: Weight change:   Intake/Output Summary (Last 24 hours) at 02/22/2024 1602 Last data filed at 02/22/2024 1547 Gross per 24 hour  Intake 1300 ml  Output 1700 ml  Net -400 ml   Blood pressure 134/68, pulse (!) 59, temperature 97.9 F (36.6 C), temperature source Oral, resp. rate 11, height 5\' 10"  (1.778 m), weight 106.1 kg, SpO2 98%. Temp:  [97.8 F (36.6 C)-98.2 F (36.8 C)] 97.9 F (36.6 C) (04/14 1230) Pulse Rate:  [59-72] 59 (04/14 0900) Resp:  [10-16] 11 (04/14 1233) BP: (134-160)/(65-78) 134/68 (04/14  1233) SpO2:  [97 %-99 %] 98 % (04/14 1233)  Physical Exam: Physical Exam Constitutional:      Appearance: He is well-developed.  HENT:     Head: Normocephalic and atraumatic.  Eyes:     Conjunctiva/sclera: Conjunctivae normal.  Cardiovascular:     Rate and Rhythm: Normal rate and regular rhythm.  Pulmonary:     Effort: Pulmonary effort is normal. No respiratory distress.     Breath sounds: No wheezing.  Abdominal:     General: There is no distension.     Palpations: Abdomen is soft.  Musculoskeletal:     Cervical back: Normal range of motion and neck supple.  Skin:    General: Skin is warm and dry.  Neurological:     General: No focal deficit present.     Mental Status: He is alert and oriented to person, place, and time.  Psychiatric:        Mood and Affect: Mood normal.        Behavior:  Behavior normal.        Thought Content: Thought content normal.        Judgment: Judgment normal.     Left elbow bandaged CBC:    BMET Recent Labs    02/21/24 0854 02/22/24 1014  NA 137 135  K 4.0 4.4  CL 101 94*  CO2 19* 25  GLUCOSE 97 164*  BUN 76* 76*  CREATININE 2.75* 2.40*  CALCIUM 7.6* 8.3*     Liver Panel  Recent Labs    02/20/24 1659 02/21/24 0854 02/22/24 1014  PROT 6.8 7.1  --   ALBUMIN 3.3* 3.4* 3.5  AST 13* 14*  --   ALT 8 7  --   ALKPHOS 72 66  --   BILITOT 1.0 0.8  --        Sedimentation Rate Recent Labs    02/19/24 2334  ESRSEDRATE 55*   C-Reactive Protein Recent Labs    02/19/24 2334  CRP 2.8*    Micro Results: Recent Results (from the past 720 hours)  Body fluid culture w Gram Stain     Status: None (Preliminary result)   Collection Time: 02/20/24  2:28 AM   Specimen: Synovium; Body Fluid  Result Value Ref Range Status   Specimen Description   Final    SYNOVIAL Performed at [facility], [address], [place], [place] [address]    Special Requests   Final    NONE Performed at [facility], [address], [place], [place] [address]    Gram Stain   Final    ABUNDANT WBC PRESENT, PREDOMINANTLY PMN NO ORGANISMS SEEN    Culture   Final    NO GROWTH 2 DAYS Performed at [facility], [address], [place], [place] [address]    Report Status PENDING  Incomplete  Aerobic/Anaerobic Culture w Gram Stain (surgical/deep wound)     Status: None (Preliminary result)   Collection Time: 02/20/24 12:07 PM   Specimen: Path fluid; Body Fluid  Result Value Ref Range Status   Specimen Description WOUND  Final   Special Requests LEFT ELBOW  Final   Gram Stain RARE WBC SEEN NO ORGANISMS SEEN   Final   Culture   Final    NO GROWTH 2 DAYS NO ANAEROBES ISOLATED; CULTURE IN PROGRESS FOR 5 DAYS Performed at [facility], [address], [place], [place] [address]    Report Status PENDING  Incomplete  Culture, blood  (Routine  X 2) w Reflex to ID Panel     Status: None (Preliminary result)   Collection Time: 02/20/24  3:30 PM   Specimen: BLOOD RIGHT FOREARM  Result Value Ref Range Status   Specimen Description BLOOD RIGHT FOREARM  Final   Special Requests   Final    BOTTLES DRAWN AEROBIC AND ANAEROBIC Blood Culture results may not be optimal due to an inadequate volume of blood received in culture bottles   Culture   Final    NO GROWTH 2 DAYS Performed at [facility], [address], [place], [place] [address]    Report Status PENDING  Incomplete  Culture, blood (Routine X 2) w Reflex to ID Panel     Status: None (Preliminary result)   Collection Time: 02/20/24  4:59 PM   Specimen: BLOOD  Result Value Ref Range Status   Specimen Description BLOOD SITE NOT SPECIFIED  Final   Special Requests Blood Culture adequate volume  Final   Culture   Final    NO GROWTH 2 DAYS Performed at [facility], [address], [place], [place] [address]    Report Status PENDING  Incomplete  MRSA Next Gen by PCR, Nasal     Status: Abnormal   Collection Time: 02/21/24 11:01 AM   Specimen: Nasal Mucosa; Nasal Swab  Result Value Ref Range Status   MRSA by PCR Next Gen DETECTED (A) NOT DETECTED Final    Comment: CRITICAL RESULT CALLED TO, READ BACK BY AND VERIFIED WITH: RN Beatrice Lecher [id number] BY J RAZZAK, MT (NOTE) The GeneXpert MRSA Assay (FDA approved for NASAL specimens only), is one component of a comprehensive MRSA colonization surveillance program. It is not intended to diagnose MRSA infection nor to guide or monitor treatment for MRSA infections. Test performance is not FDA approved in patients less than 76 years old. Performed at [facility], [address] [place], [place] [address]     Studies/Results: [place] EKG SITE RITE Result Date: 02/22/2024 If [facility] image not attached, placement could not be confirmed due to current cardiac rhythm.     Assessment/Plan:  INTERVAL  HISTORY: Cultures are unrevealing   Principal Problem:   Septic joint (HCC) Active Problems:   Essential hypertension   Depression   BPH (benign prostatic hyperplasia)   Coronary artery disease   S/P CABG x 5   COPD (chronic obstructive pulmonary disease) (HCC)   OSA (obstructive sleep apnea)   Cancer of sigmoid colon (HCC)   GERD without esophagitis   Morbid obesity (HCC)   QT prolongation   S/P BKA (below knee amputation) unilateral, right (HCC)   Dyslipidemia   Hypothyroidism   CKD stage 3a, GFR 45-59 ml/min (HCC)   Moderate aortic stenosis   Type II diabetes mellitus with renal manifestations (HCC)   Chronic diastolic CHF (congestive heart failure) (HCC)   Anemia of chronic renal failure    Shane Chambers is a 63 y.o. male with prior history of CHF, CAD s/p CABG, rectal cancer, COPD, DM, CKD, anemia, BPH depression, kidney stones, GERD, HLD, HTN, hypothyroidism, PVD, sleep apnea, right BKA with revision who presented to the hospital with left elbow pain and is found to have a septic arthritis status post I&D.  #1 Septic left elbow:  Will continue with the daptomycin and ciprofloxacin which we should be able to change over to an oral form for now  Follow-up culture data we will plan on giving him 6 weeks of  antibiotics  #2 allergy to ceftriaxone: Claims to have isolated difficulty breathing with prior administration and with administration this time.  He is quite upset about having received the antibiotic.  We therefore changed him to ciprofloxacin   #3 QT prolongation patient's QT is stable on ciprofloxacin   ALEXYS GASSETT has an appointment on  [phone] with Dr. Ernie Heal at 345PM at  [facility], which  is located in the [facility] at  [address] in [place].  [address], which is located to the left of the elevators.  Phone: [phone]  Fax: [phone]  [email or web address]  The patient should arrive 30 minutes prior to their appoitment.  I have personally spent 50 minutes involved in face-to-face and non-face-to-face activities for this patient on the day of the visit. Professional time spent includes the following activities: Preparing to see the patient (review of tests), Obtaining and/or reviewing separately obtained history (admission/discharge record), Performing a medically appropriate examination and/or evaluation , Ordering medications/tests/procedures, referring and communicating with other health care professionals, Documenting clinical information in the EMR, Independently interpreting results (not separately reported), Communicating results to the patient/family/caregiver, Counseling and educating the patient/family/caregiver and Care coordination (not separately reported).   Evaluation of the patient requires complex antimicrobial therapy evaluation, counseling , isolation needs to reduce disease transmission and risk assessment and mitigation.     LOS: 2 days   Sheela Denmark 02/22/2024, 4:02 PM

## 2024-02-23 DIAGNOSIS — M009 Pyogenic arthritis, unspecified: Secondary | ICD-10-CM | POA: Diagnosis not present

## 2024-02-23 LAB — CBC WITH DIFFERENTIAL/PLATELET
Abs Immature Granulocytes: 0.11 10*3/uL — ABNORMAL HIGH (ref 0.00–0.07)
Basophils Absolute: 0 10*3/uL (ref 0.0–0.1)
Basophils Relative: 0 %
Eosinophils Absolute: 0.3 10*3/uL (ref 0.0–0.5)
Eosinophils Relative: 3 %
HCT: 27.7 % — ABNORMAL LOW (ref 39.0–52.0)
Hemoglobin: 9.3 g/dL — ABNORMAL LOW (ref 13.0–17.0)
Immature Granulocytes: 1 %
Lymphocytes Relative: 9 %
Lymphs Abs: 1 10*3/uL (ref 0.7–4.0)
MCH: 29.9 pg (ref 26.0–34.0)
MCHC: 33.6 g/dL (ref 30.0–36.0)
MCV: 89.1 fL (ref 80.0–100.0)
Monocytes Absolute: 0.9 10*3/uL (ref 0.1–1.0)
Monocytes Relative: 9 %
Neutro Abs: 8.4 10*3/uL — ABNORMAL HIGH (ref 1.7–7.7)
Neutrophils Relative %: 78 %
Platelets: 230 10*3/uL (ref 150–400)
RBC: 3.11 MIL/uL — ABNORMAL LOW (ref 4.22–5.81)
RDW: 12.8 % (ref 11.5–15.5)
WBC: 10.8 10*3/uL — ABNORMAL HIGH (ref 4.0–10.5)
nRBC: 0 % (ref 0.0–0.2)

## 2024-02-23 LAB — RENAL FUNCTION PANEL
Albumin: 3.1 g/dL — ABNORMAL LOW (ref 3.5–5.0)
Anion gap: 13 (ref 5–15)
BUN: 77 mg/dL — ABNORMAL HIGH (ref 8–23)
CO2: 28 mmol/L (ref 22–32)
Calcium: 8.3 mg/dL — ABNORMAL LOW (ref 8.9–10.3)
Chloride: 92 mmol/L — ABNORMAL LOW (ref 98–111)
Creatinine, Ser: 2.67 mg/dL — ABNORMAL HIGH (ref 0.61–1.24)
GFR, Estimated: 26 mL/min — ABNORMAL LOW (ref >60)
Glucose, Bld: 164 mg/dL — ABNORMAL HIGH (ref 70–99)
Phosphorus: 4.6 mg/dL (ref 2.5–4.6)
Potassium: 4 mmol/L (ref 3.5–5.1)
Sodium: 133 mmol/L — ABNORMAL LOW (ref 135–145)

## 2024-02-23 LAB — MAGNESIUM: Magnesium: 1.9 mg/dL (ref 1.7–2.4)

## 2024-02-23 LAB — GLUCOSE, CAPILLARY
Glucose-Capillary: 120 mg/dL — ABNORMAL HIGH (ref 70–99)
Glucose-Capillary: 128 mg/dL — ABNORMAL HIGH (ref 70–99)
Glucose-Capillary: 138 mg/dL — ABNORMAL HIGH (ref 70–99)
Glucose-Capillary: 173 mg/dL — ABNORMAL HIGH (ref 70–99)

## 2024-02-23 LAB — BODY FLUID CULTURE W GRAM STAIN

## 2024-02-23 MED ORDER — SODIUM CHLORIDE 0.9% FLUSH
10.0000 mL | Freq: Two times a day (BID) | INTRAVENOUS | Status: DC
  Administered 2024-02-23 – 2024-02-25 (×5): 10 mL

## 2024-02-23 MED ORDER — HYDROCORTISONE 1 % EX CREA
TOPICAL_CREAM | Freq: Three times a day (TID) | CUTANEOUS | Status: DC
  Filled 2024-02-23: qty 28

## 2024-02-23 MED ORDER — SODIUM CHLORIDE 0.9% FLUSH: 10.0000 mL | INTRAVENOUS | Status: DC | PRN

## 2024-02-23 NOTE — Progress Notes (Signed)
 Peripherally Inserted Central Catheter Placement  The IV Nurse has discussed with the patient and/or persons authorized to consent for the patient, the purpose of this procedure and the potential benefits and risks involved with this procedure.  The benefits include less needle sticks, lab draws from the catheter, and the patient may be discharged home with the catheter. Risks include, but not limited to, infection, bleeding, blood clot (thrombus formation), and puncture of an artery; nerve damage and irregular heartbeat and possibility to perform a PICC exchange if needed/ordered by physician.  Alternatives to this procedure were also discussed.  Bard Power PICC patient education guide, fact sheet on infection prevention and patient information card has been provided to patient /or left at bedside.    PICC Placement Documentation  PICC Single Lumen 02/23/24 Right Basilic 43 cm 1 cm (Active)  Indication for Insertion or Continuance of Line Prolonged intravenous therapies 02/23/24 1205  Exposed Catheter (cm) 1 cm 02/23/24 1205  Site Assessment Clean, Dry, Intact 02/23/24 1205  Line Status Flushed;Saline locked;Blood return noted 02/23/24 1205  Dressing Type Transparent;Securing device 02/23/24 1205  Dressing Status Clean, Dry, Intact;Other (Comment) 02/23/24 1205  Line Care Connections checked and tightened 02/23/24 1205  Line Adjustment (NICU/IV Team Only) No 02/23/24 1205  Dressing Intervention New dressing;Adhesive placed at insertion site (IV team only) 02/23/24 1205  Dressing Change Due 03/01/24 02/23/24 1205       Darrill Vreeland Haywood Lisle 02/23/2024, 12:11 PM

## 2024-02-23 NOTE — Plan of Care (Signed)

## 2024-02-23 NOTE — Care Management Important Message (Signed)
 Important Message  Patient Details  Name: TRAVARES NELLES MRN: [id number] Date of Birth: 04-Mar-1961   Important Message Given:        Janith Melnick 02/23/2024, 9:45 AM

## 2024-02-23 NOTE — Progress Notes (Incomplete)
 PHARMACY CONSULT NOTE FOR:  OUTPATIENT  PARENTERAL ANTIBIOTIC THERAPY (OPAT)  Indication: L-septic elbow Regimen: Daptomycin 700 mg IV every 24 hours and Ertapenem 1g IV every 24 hours End date: 04/01/24 (6 weeks from OR 02/20/24)  IV antibiotic discharge orders are pended. To discharging provider:  please sign these orders via discharge navigator,  Select New Orders & click on the button choice - Manage This Unsigned Work.     Thank you for allowing pharmacy to be a part of this patient's care.  Garland Junk, PharmD, BCPS, BCIDP Infectious Diseases Clinical Pharmacist 02/24/2024 2:34 PM   **Pharmacist phone directory can now be found on [email or web address] ([email or web address]).  Listed under [facility].

## 2024-02-23 NOTE — Consult Note (Signed)
 WOC team received consult for DTPI buttocks/sacrum/coccyx.    Please note that the [facility] nursing team is utilizing a standardized work plan to manage patient consults. We are triaging consults and will try to see the patients within 48 hours. Wound photos in the patient's chart allow us  to consult on the patient in the most efficient and timely manner.    Thank you ,   Ronni Colace MSN, RN-BC, [employer] [phone]

## 2024-02-23 NOTE — Progress Notes (Addendum)
 PROGRESS NOTE  CLEMENS LACHMAN ZHY:[id number] DOB: 02-Sep-1961 DOA: 02/19/2024 PCP: Darcel Early, MD   LOS: 3 days   Brief narrative:  Patient is a 63 years old male with past medical history of hypertension, hyperlipidemia, GERD, CKD stage IV, CAD, COPD, hypothyroidism, diabetes mellitus, aortic stenosis, chronic diastolic heart failure, history of colon cancer, depression, obesity and sleep apnea presented to the hospital with septic left elbow joint after he had irrigation and debridement by orthopedic team on 02/20/2024.  He did have allergic reaction to Rocephin and infectious disease was consulted.  During hospitalization patient also had difficulty breathing.  Allergic reaction has improved.  Pending cultures at this time.  Assessment/Plan: Principal Problem:   Septic joint (HCC) Active Problems:   QT prolongation   Depression   Essential hypertension   COPD (chronic obstructive pulmonary disease) (HCC)   Hypothyroidism   Type II diabetes mellitus with renal manifestations (HCC)   Chronic diastolic CHF (congestive heart failure) (HCC)   CKD stage 3a, GFR 45-59 ml/min (HCC)   BPH (benign prostatic hyperplasia)   Coronary artery disease   S/P CABG x 5   OSA (obstructive sleep apnea)   Cancer of sigmoid colon (HCC)   GERD without esophagitis   Morbid obesity (HCC)   S/P BKA (below knee amputation) unilateral, right (HCC)   Dyslipidemia   Moderate aortic stenosis   Anemia of chronic renal failure   Allergy to cephalosporin   Septic left elbow joint Patient presented with left elbow pain.  Cell count of synovial fluid showed 50,000 white cells.  Gram stain and culture with no organisms in 2 days.  Blood cultures negative in 2 days.  ESR 55 CRP 2.8.  WBC at 10.8.  Seen by orthopedics and underwent irrigation and debridement of the left elbow wound.  Infectious disease on board.  Currently on daptomycin and ciprofloxacin.  Follow ID recommendations.  Hypertension Continue to  monitor closely.   Hyperlipidemia Continue Lipitor  GERD Continue PPI  Mild AKI on CKD 4 Continue to monitor creatinine levels.  CAD No chest pain or shortness of breath.     COPD Continue Dulera.   Hypothyroidism Continue Synthroid   Diabetes mellitus Continue sliding scale insulin   AS/Chronic diastolic CHF > Last echo in December 2024 with EF 55-60%, G3 DD, mildly reduced RV function, only mild aortic stenosis. Compensated.   Anemia Hemoglobin stable at 10.3   BPH Neurogenic Bladder Continue tamsulosin - Has suprapubic catheter placement planned for 4/14, may need to coordinate this if he is still here.   Depression Continue hydroxyzine and trazodone   Class I obesity Body mass index is 33.58 kg/m.  Might benefit from lifestyle modifications and weight loss as outpatient.   OSA Continue home CPAP CPAP   QT prolongation Improved.  Magnesium was low and was replenished.  Latest magnesium of 1.9   Hypomagnesemia: Replenished and improved.   Hyperphosphatemia: Continue renal diet.   History of colon cancer No acute issues  Status post right BKA - Noted   DVT prophylaxis: enoxaparin (LOVENOX) injection 40 mg Start: 02/20/24 1800   Disposition: Likely back to facility when stable.  Anti-infectives (From admission, onward)    Start     Dose/Rate Route Frequency Ordered Stop   02/22/24 1600  vancomycin (VANCOREADY) IVPB 1750 mg/350 mL  Status:  Discontinued       Placed in "Followed by" Linked Group   1,750 mg 175 mL/hr over 120 Minutes Intravenous Every 48 hours 02/20/24 1451 02/21/24  1121   02/22/24 1400  DAPTOmycin (CUBICIN) IVPB 700 mg/145mL premix        8 mg/kg  86.2 kg (Adjusted) 200 mL/hr over 30 Minutes Intravenous Daily 02/21/24 1121     02/22/24 1200  ciprofloxacin (CIPRO) tablet 500 mg  Status:  Discontinued        500 mg Oral 2 times daily 02/21/24 1450 02/21/24 1530   02/22/24 1200  ciprofloxacin (CIPRO) IVPB 400 mg        400  mg 200 mL/hr over 60 Minutes Intravenous Every 12 hours 02/21/24 1530     02/21/24 1215  cefTRIAXone (ROCEPHIN) 2 g in sodium chloride 0.9 % 100 mL IVPB  Status:  Discontinued        2 g 200 mL/hr over 30 Minutes Intravenous Every 24 hours 02/21/24 1121 02/21/24 1440   02/20/24 1600  vancomycin (VANCOCIN) IVPB 1000 mg/200 mL premix       Placed in "Followed by" Linked Group   1,000 mg 200 mL/hr over 60 Minutes Intravenous  Once 02/20/24 1451 02/20/24 1811   02/20/24 1128  vancomycin (VANCOCIN) 1-5 GM/200ML-% IVPB       Note to Pharmacy: Landry Mellow: cabinet override      02/20/24 1128 02/20/24 2344        Subjective: Today, patient was seen and examined at bedside.  Complains of pain over the left elbow site.  Denies any shortness of breath cough fever chills or rigor.  Objective: Vitals:   02/23/24 1001 02/23/24 1211  BP: 134/66 (!) 147/74  Pulse:  60  Resp:  11  Temp:    SpO2:  90%    Intake/Output Summary (Last 24 hours) at 02/23/2024 1322 Last data filed at 02/23/2024 0454 Gross per 24 hour  Intake 526.34 ml  Output 2100 ml  Net -1573.66 ml   Filed Weights   02/19/24 2224 02/20/24 1110  Weight: 106.1 kg 106.1 kg   Body mass index is 33.58 kg/m.   Physical Exam: GENERAL: Patient is alert awake and oriented. Not in obvious distress.  Obese built HENT: No scleral pallor or icterus. Pupils equally reactive to light. Oral mucosa is moist NECK: is supple, no gross swelling noted. CHEST: Clear to auscultation. No crackles or wheezes.  CVS: S1 and S2 heard, no murmur. Regular rate and rhythm.  ABDOMEN: Soft, non-tender, bowel sounds are present. EXTREMITIES: No edema.  Left elbow with dressing.  Right upper extremity PICC line in place. CNS: Cranial nerves are intact. No focal motor deficits. SKIN: warm and dry without rashes.  Data Review: I have personally reviewed the following laboratory data and studies,  CBC: Recent Labs  Lab 02/19/24 2334 02/20/24 1530  02/21/24 0854 02/22/24 1014 02/23/24 0346  WBC 11.8* 12.0* 9.9 9.8 10.8*  NEUTROABS 9.1*  --   --  8.5* 8.4*  HGB 10.3* 10.8* 9.7* 10.1* 9.3*  HCT 31.1* 32.5* 30.5* 30.3* 27.7*  MCV 88.9 89.3 91.9 88.9 89.1  PLT 213 241 178 231 230   Basic Metabolic Panel: Recent Labs  Lab 02/19/24 2334 02/20/24 1659 02/21/24 0854 02/22/24 1014 02/23/24 0346  NA 135 136 137 135 133*  K 4.0 3.6 4.0 4.4 4.0  CL 97* 99 101 94* 92*  CO2 23 25 19* 25 28  GLUCOSE 140* 122* 97 164* 164*  BUN 73* 69* 76* 76* 77*  CREATININE 2.44* 2.44* 2.75* 2.40* 2.67*  CALCIUM 7.9* 7.6* 7.6* 8.3* 8.3*  MG  --   --   --  1.1*  1.9  PHOS  --   --   --  5.2* 4.6   Liver Function Tests: Recent Labs  Lab 02/20/24 1659 02/21/24 0854 02/22/24 1014 02/23/24 0346  AST 13* 14*  --   --   ALT 8 7  --   --   ALKPHOS 72 66  --   --   BILITOT 1.0 0.8  --   --   PROT 6.8 7.1  --   --   ALBUMIN 3.3* 3.4* 3.5 3.1*   No results for input(s): "LIPASE", "AMYLASE" in the last 168 hours. No results for input(s): "AMMONIA" in the last 168 hours. Cardiac Enzymes: Recent Labs  Lab 02/22/24 0355  CKTOTAL 144   BNP (last 3 results) Recent Labs    03/26/23 2322 07/30/23 1414 10/24/23 1007  BNP 891.3* 320.2* 1,066.0*    ProBNP (last 3 results) No results for input(s): "PROBNP" in the last 8760 hours.  CBG: Recent Labs  Lab 02/22/24 1239 02/22/24 1659 02/22/24 2140 02/23/24 0636 02/23/24 1213  GLUCAP 180* 184* 160* 120* 128*   Recent Results (from the past 240 hours)  Body fluid culture w Gram Stain     Status: None (Preliminary result)   Collection Time: 02/20/24  2:28 AM   Specimen: Synovium; Body Fluid  Result Value Ref Range Status   Specimen Description   Final    SYNOVIAL Performed at [facility], [address], [place], [place] [address]    Special Requests   Final    NONE Performed at [facility], [address], [place], [place] [address]    Gram Stain   Final    ABUNDANT WBC PRESENT,  PREDOMINANTLY PMN NO ORGANISMS SEEN    Culture   Final    NO GROWTH 2 DAYS Performed at [facility], [address] [place], [place] [address]    Report Status PENDING  Incomplete  Aerobic/Anaerobic Culture w Gram Stain (surgical/deep wound)     Status: None (Preliminary result)   Collection Time: 02/20/24 12:07 PM   Specimen: Path fluid; Body Fluid  Result Value Ref Range Status   Specimen Description WOUND  Final   Special Requests LEFT ELBOW  Final   Gram Stain RARE WBC SEEN NO ORGANISMS SEEN   Final   Culture   Final    NO GROWTH 3 DAYS NO ANAEROBES ISOLATED; CULTURE IN PROGRESS FOR 5 DAYS Performed at [facility], [address] [place], [place] [address]    Report Status PENDING  Incomplete  Culture, blood (Routine X 2) w Reflex to ID Panel     Status: None (Preliminary result)   Collection Time: 02/20/24  3:30 PM   Specimen: BLOOD RIGHT FOREARM  Result Value Ref Range Status   Specimen Description BLOOD RIGHT FOREARM  Final   Special Requests   Final    BOTTLES DRAWN AEROBIC AND ANAEROBIC Blood Culture results may not be optimal due to an inadequate volume of blood received in culture bottles   Culture   Final    NO GROWTH 3 DAYS Performed at [facility], [address] [place], [place] [address]    Report Status PENDING  Incomplete  Culture, blood (Routine X 2) w Reflex to ID Panel     Status: None (Preliminary result)   Collection Time: 02/20/24  4:59 PM   Specimen: BLOOD  Result Value Ref Range Status   Specimen Description BLOOD SITE NOT SPECIFIED  Final   Special Requests  Blood Culture adequate volume  Final   Culture   Final    NO GROWTH 3 DAYS Performed at [facility], [address] [place], [place] [address]    Report Status PENDING  Incomplete  MRSA Next Gen by PCR, Nasal     Status: Abnormal   Collection Time: 02/21/24 11:01 AM   Specimen: Nasal Mucosa; Nasal Swab  Result Value Ref Range Status   MRSA by PCR Next Gen  DETECTED (A) NOT DETECTED Final    Comment: CRITICAL RESULT CALLED TO, READ BACK BY AND VERIFIED WITH: RN Lonn Roads [id number] BY J RAZZAK, MT (NOTE) The GeneXpert MRSA Assay (FDA approved for NASAL specimens only), is one component of a comprehensive MRSA colonization surveillance program. It is not intended to diagnose MRSA infection nor to guide or monitor treatment for MRSA infections. Test performance is not FDA approved in patients less than 50 years old. Performed at [facility], [address], [place], [place] [address]      Studies: US  EKG SITE RITE Result Date: 02/22/2024 If Site Rite image not attached, placement could not be confirmed due to current cardiac rhythm.     Ester Mabe, MD  [facility] 02/23/2024  If 7PM-7AM, please contact night-coverage

## 2024-02-23 NOTE — Hospital Course (Signed)
 Patient is a 63 years old male with past medical history of hypertension, hyperlipidemia, GERD, CKD stage IV, CAD, COPD, hypothyroidism, diabetes mellitus, aortic stenosis, chronic diastolic heart failure, history of colon cancer, depression, obesity and sleep apnea presented to the hospital with septic left elbow joint after he had irrigation and debridement by orthopedic team on 02/20/2024.  He did have allergic reaction to Rocephin and infectious disease was consulted.  During hospitalization patient also had difficulty breathing.  Allergic reaction has improved.  Pending cultures at this time.  Septic left elbow joint Patient presented with left elbow pain.  Cell count of synovial fluid showed 50,000 white cells.  Gram stain and culture with no organisms in 2 days.  Blood cultures negative in 2 days.  ESR 55 CRP 2.8.  WBC at 10.8.  Seen by orthopedics and underwent irrigation and debridement of the left elbow wound.  Infectious disease on board.  Currently on daptomycin and ciprofloxacin.  Follow ID recommendations.  Hypertension Continue to monitor closely.   Hyperlipidemia Continue Lipitor  GERD Continue PPI  CKD 4/3B: Continue to monitor creatinine levels.  CAD No chest pain or shortness of breath.     COPD Continue Dulera.   Hypothyroidism Continue Synthroid   Diabetes mellitus Continue sliding scale insulin   AS Chronic diastolic CHF > Last echo in December 2024 with EF 55-60%, G3 DD, mildly reduced RV function, only mild aortic stenosis. Stable.   Anemia Hemoglobin stable at 10.3   BPH Neurogenic Bladder Continue tamsulosin - Has suprapubic catheter placement planned for 4/14, may need to coordinate this if he is still here.   Depression Continue hydroxyzine and trazodone   Class I obesity Body mass index is 33.58 kg/m.  Might benefit from lifestyle modifications and weight loss as outpatient.   OSA Continue home CPAP CPAP   QT prolongation Improved.   Magnesium was low and was replenished.  Latest magnesium of 1.9   Hypomagnesemia: Replenished and improved   Hyperphosphatemia: Renal diet   History of colon cancer No acute issues  Status post right BKA - Noted

## 2024-02-23 NOTE — Consult Note (Addendum)
 WOC Nurse Consult Note: Consult requested for assessment of possible Deep tissue pressure injury to sacrum and bilat buttocks.  Affected area is 9X7, red but blanches so it is not a pressure injury.  Foam dressing in place to protect.  Please re-consult if further assistance is needed.  Thank-you,  Wiliam Harder MSN, RN, CWOCN, [place], CNS [phone]

## 2024-02-24 ENCOUNTER — Other Ambulatory Visit: Payer: Self-pay | Admitting: Radiology

## 2024-02-24 DIAGNOSIS — N1831 Chronic kidney disease, stage 3a: Secondary | ICD-10-CM | POA: Diagnosis not present

## 2024-02-24 DIAGNOSIS — Z951 Presence of aortocoronary bypass graft: Secondary | ICD-10-CM | POA: Diagnosis not present

## 2024-02-24 DIAGNOSIS — R9431 Abnormal electrocardiogram [ECG] [EKG]: Secondary | ICD-10-CM | POA: Diagnosis not present

## 2024-02-24 DIAGNOSIS — M009 Pyogenic arthritis, unspecified: Secondary | ICD-10-CM | POA: Diagnosis not present

## 2024-02-24 DIAGNOSIS — M00822 Arthritis due to other bacteria, left elbow: Secondary | ICD-10-CM | POA: Diagnosis not present

## 2024-02-24 LAB — GLUCOSE, CAPILLARY
Glucose-Capillary: 138 mg/dL — ABNORMAL HIGH (ref 70–99)
Glucose-Capillary: 126 mg/dL — ABNORMAL HIGH (ref 70–99)
Glucose-Capillary: 120 mg/dL — ABNORMAL HIGH (ref 70–99)
Glucose-Capillary: 114 mg/dL — ABNORMAL HIGH (ref 70–99)
Glucose-Capillary: 189 mg/dL — ABNORMAL HIGH (ref 70–99)

## 2024-02-24 MED ORDER — MUPIROCIN 2 % EX OINT
1.0000 | TOPICAL_OINTMENT | Freq: Two times a day (BID) | CUTANEOUS | Status: DC
  Administered 2024-02-24 – 2024-02-25 (×3): 1 via NASAL
  Filled 2024-02-24: qty 22

## 2024-02-24 MED ORDER — SODIUM CHLORIDE 0.9 % IV SOLN
1.0000 g | INTRAVENOUS | Status: DC
  Administered 2024-02-24 – 2024-02-25 (×2): 1 g via INTRAVENOUS
  Filled 2024-02-24 (×2): qty 1000

## 2024-02-24 NOTE — Plan of Care (Signed)

## 2024-02-24 NOTE — Progress Notes (Signed)
 PROGRESS NOTE  [name]:[id number] DOB: 02/04/1961 DOA: 02/19/2024 PCP: [name], MD   LOS: 4 days   Brief narrative:  Patient is a 63 years old male with past medical history of hypertension, hyperlipidemia, GERD, CKD stage IV, CAD, COPD, hypothyroidism, diabetes mellitus, aortic stenosis, chronic diastolic heart failure, history of colon cancer, depression, obesity and sleep apnea presented to the hospital with septic left elbow joint after he had irrigation and debridement by orthopedic team on 02/20/2024.  He did have allergic reaction to Rocephin and infectious disease was consulted.  During hospitalization, patient also had difficulty breathing.  Allergic reaction has improved.  Pending infectious disease input at this time for antibiotics.  Assessment/Plan:  Principal Problem:   Septic joint (HCC) Active Problems:   QT prolongation   Depression   Essential hypertension   COPD (chronic obstructive pulmonary disease) (HCC)   Hypothyroidism   Type II diabetes mellitus with renal manifestations (HCC)   Chronic diastolic CHF (congestive heart failure) (HCC)   CKD stage 3a, GFR 45-59 ml/min (HCC)   BPH (benign prostatic hyperplasia)   Coronary artery disease   S/P CABG x 5   OSA (obstructive sleep apnea)   Cancer of sigmoid colon (HCC)   GERD without esophagitis   Morbid obesity (HCC)   S/P BKA (below knee amputation) unilateral, right (HCC)   Dyslipidemia   Moderate aortic stenosis   Anemia of chronic renal failure   Allergy to cephalosporin   Septic left elbow joint Patient presented with left elbow pain.  Cell count of synovial fluid showed 50,000 white cells.  Gram stain and culture with no organisms so far.  Blood cultures negative in 2 days.  ESR 55 CRP 2.8.  WBC at 10.8.  Seen by orthopedics and underwent irrigation and debridement of the left elbow wound.  Infectious disease on board.  Currently on daptomycin and ciprofloxacin.  Reached out to ID for  further determination on antibiotic.  Follow ID recommendations.  Hypertension Continue to monitor closely.   Hyperlipidemia Continue Lipitor  GERD Continue PPI  Mild AKI on CKD 4 Continue to monitor creatinine levels.  CAD No chest pain or shortness of breath.     COPD Continue Dulera.   Hypothyroidism Continue Synthroid   Diabetes mellitus Continue sliding scale insulin   AS/Chronic diastolic CHF > Last echo in December 2024 with EF 55-60%, G3 DD, mildly reduced RV function, only mild aortic stenosis. Compensated.   Anemia Hemoglobin stable at 10.3   BPH Neurogenic Bladder Continue tamsulosin. Has suprapubic catheter placement planned for 4/14, as outpatient.  Currently has a Foley catheter in place.  Will need to address this after discharge.   Depression Continue hydroxyzine and trazodone   Class I obesity Body mass index is 33.58 kg/m.  Might benefit from lifestyle modifications and weight loss as outpatient.   OSA Continue home CPAP CPAP   QT prolongation Improved.  Magnesium was low and was replenished.  Latest magnesium of 1.9   Hypomagnesemia: Replenished and improved.   Hyperphosphatemia: Continue renal diet.   History of colon cancer No acute issues  Status post right BKA - Noted   DVT prophylaxis: enoxaparin (LOVENOX) injection 40 mg Start: 02/20/24 1800   Disposition: Likely back to skilled nursing facility facility okay with ID.  Anti-infectives (From admission, onward)    Start     Dose/Rate Route Frequency Ordered Stop   02/24/24 1145  ertapenem (INVANZ) 1 g in sodium chloride 0.9 % 100 mL IVPB  1 g 200 mL/hr over 30 Minutes Intravenous Every 24 hours 02/24/24 1049     02/22/24 1600  vancomycin (VANCOREADY) IVPB 1750 mg/350 mL  Status:  Discontinued       Placed in "Followed by" Linked Group   1,750 mg 175 mL/hr over 120 Minutes Intravenous Every 48 hours 02/20/24 1451 02/21/24 1121   02/22/24 1400  DAPTOmycin  (CUBICIN) IVPB 700 mg/100mL premix        8 mg/kg  86.2 kg (Adjusted) 200 mL/hr over 30 Minutes Intravenous Daily 02/21/24 1121     02/22/24 1200  ciprofloxacin (CIPRO) tablet 500 mg  Status:  Discontinued        500 mg Oral 2 times daily 02/21/24 1450 02/21/24 1530   02/22/24 1200  ciprofloxacin (CIPRO) IVPB 400 mg  Status:  Discontinued        400 mg 200 mL/hr over 60 Minutes Intravenous Every 12 hours 02/21/24 1530 02/24/24 1049   02/21/24 1215  cefTRIAXone (ROCEPHIN) 2 g in sodium chloride 0.9 % 100 mL IVPB  Status:  Discontinued        2 g 200 mL/hr over 30 Minutes Intravenous Every 24 hours 02/21/24 1121 02/21/24 1440   02/20/24 1600  vancomycin (VANCOCIN) IVPB 1000 mg/200 mL premix       Placed in "Followed by" Linked Group   1,000 mg 200 mL/hr over 60 Minutes Intravenous  Once 02/20/24 1451 02/20/24 1811   02/20/24 1128  vancomycin (VANCOCIN) 1-5 GM/200ML-% IVPB       Note to Pharmacy: Teodora Fell: cabinet override      02/20/24 1128 02/20/24 2344        Subjective: Today, patient was seen and examined at bedside.  Complains of mild pain over the left elbow site.  Denies any shortness of breath cough.  Denies any chest pain, nausea, vomiting, fever or chills.  Objective: Vitals:   02/24/24 0944 02/24/24 1150  BP: (!) 174/62 (!) 155/59  Pulse:  (!) 54  Resp: 14 14  Temp:  98.5 F (36.9 C)  SpO2:  97%    Intake/Output Summary (Last 24 hours) at 02/24/2024 1436 Last data filed at 02/24/2024 0830 Gross per 24 hour  Intake --  Output 1900 ml  Net -1900 ml   Filed Weights   02/19/24 2224 02/20/24 1110  Weight: 106.1 kg 106.1 kg   Body mass index is 33.58 kg/m.   Physical Exam:  GENERAL: Patient is alert awake and oriented. Not in obvious distress.  Obese built HENT: No scleral pallor or icterus. Pupils equally reactive to light. Oral mucosa is moist NECK: is supple, no gross swelling noted. CHEST: Clear to auscultation. No crackles or wheezes.  CVS: S1 and S2  heard, no murmur. Regular rate and rhythm.  ABDOMEN: Soft, non-tender, bowel sounds are present.  Foley catheter in place. EXTREMITIES: No edema.  Left elbow with dressing.  Right upper extremity PICC line in place.  Right below-knee amputation. CNS: Cranial nerves are intact.  Moves extremities. SKIN: warm and dry without rashes.   left elbow with dressing.  Data Review: I have personally reviewed the following laboratory data and studies,  CBC: Recent Labs  Lab 02/19/24 2334 02/20/24 1530 02/21/24 0854 02/22/24 1014 02/23/24 0346  WBC 11.8* 12.0* 9.9 9.8 10.8*  NEUTROABS 9.1*  --   --  8.5* 8.4*  HGB 10.3* 10.8* 9.7* 10.1* 9.3*  HCT 31.1* 32.5* 30.5* 30.3* 27.7*  MCV 88.9 89.3 91.9 88.9 89.1  PLT 213 241 178 231  230   Basic Metabolic Panel: Recent Labs  Lab 02/19/24 2334 02/20/24 1659 02/21/24 0854 02/22/24 1014 02/23/24 0346  NA 135 136 137 135 133*  K 4.0 3.6 4.0 4.4 4.0  CL 97* 99 101 94* 92*  CO2 23 25 19* 25 28  GLUCOSE 140* 122* 97 164* 164*  BUN 73* 69* 76* 76* 77*  CREATININE 2.44* 2.44* 2.75* 2.40* 2.67*  CALCIUM 7.9* 7.6* 7.6* 8.3* 8.3*  MG  --   --   --  1.1* 1.9  PHOS  --   --   --  5.2* 4.6   Liver Function Tests: Recent Labs  Lab 02/20/24 1659 02/21/24 0854 02/22/24 1014 02/23/24 0346  AST 13* 14*  --   --   ALT 8 7  --   --   ALKPHOS 72 66  --   --   BILITOT 1.0 0.8  --   --   PROT 6.8 7.1  --   --   ALBUMIN 3.3* 3.4* 3.5 3.1*   No results for input(s): "LIPASE", "AMYLASE" in the last 168 hours. No results for input(s): "AMMONIA" in the last 168 hours. Cardiac Enzymes: Recent Labs  Lab 02/22/24 0355  CKTOTAL 144   BNP (last 3 results) Recent Labs    03/26/23 2322 07/30/23 1414 10/24/23 1007  BNP 891.3* 320.2* 1,066.0*    ProBNP (last 3 results) No results for input(s): "PROBNP" in the last 8760 hours.  CBG: Recent Labs  Lab 02/23/24 1653 02/23/24 2130 02/24/24 0604 02/24/24 0828 02/24/24 1145  GLUCAP 138* 173* 138*  126* 120*   Recent Results (from the past 240 hours)  Body fluid culture w Gram Stain     Status: None   Collection Time: 02/20/24  2:28 AM   Specimen: Synovium; Body Fluid  Result Value Ref Range Status   Specimen Description   Final    SYNOVIAL Performed at [facility], [address], [place], [place] [address]    Special Requests   Final    NONE Performed at [facility], [address] [place], [place] [address]    Gram Stain   Final    ABUNDANT WBC PRESENT, PREDOMINANTLY PMN NO ORGANISMS SEEN    Culture   Final    NO GROWTH 3 DAYS Performed at [facility], [address], [place], [place] [address]    Report Status 02/23/2024 FINAL  Final  Aerobic/Anaerobic Culture w Gram Stain (surgical/deep wound)     Status: None (Preliminary result)   Collection Time: 02/20/24 12:07 PM   Specimen: Path fluid; Body Fluid  Result Value Ref Range Status   Specimen Description WOUND  Final   Special Requests LEFT ELBOW  Final   Gram Stain RARE WBC SEEN NO ORGANISMS SEEN   Final   Culture   Final    NO GROWTH 4 DAYS NO ANAEROBES ISOLATED; CULTURE IN PROGRESS FOR 5 DAYS Performed at [facility], [address], [place], [place] [address]    Report Status PENDING  Incomplete  Culture, blood (Routine X 2) w Reflex to ID Panel     Status: None (Preliminary result)   Collection Time: 02/20/24  3:30 PM   Specimen: BLOOD RIGHT FOREARM  Result Value Ref Range Status   Specimen Description BLOOD RIGHT FOREARM  Final   Special Requests   Final    BOTTLES DRAWN AEROBIC AND ANAEROBIC Blood Culture results may not be optimal due to an inadequate volume of blood received in  culture bottles   Culture   Final    NO GROWTH 4 DAYS Performed at [facility], [address], [place], [place] [address]    Report Status PENDING  Incomplete  Culture, blood (Routine X 2) w Reflex to ID Panel     Status: None (Preliminary result)   Collection Time: 02/20/24  4:59 PM   Specimen:  BLOOD  Result Value Ref Range Status   Specimen Description BLOOD SITE NOT SPECIFIED  Final   Special Requests Blood Culture adequate volume  Final   Culture   Final    NO GROWTH 4 DAYS Performed at [facility], [address] [place], [place] [address]    Report Status PENDING  Incomplete  MRSA Next Gen by PCR, Nasal     Status: Abnormal   Collection Time: 02/21/24 11:01 AM   Specimen: Nasal Mucosa; Nasal Swab  Result Value Ref Range Status   MRSA by PCR Next Gen DETECTED (A) NOT DETECTED Final    Comment: CRITICAL RESULT CALLED TO, READ BACK BY AND VERIFIED WITH: RN Lonn Roads [id number] BY J RAZZAK, MT (NOTE) The GeneXpert MRSA Assay (FDA approved for NASAL specimens only), is one component of a comprehensive MRSA colonization surveillance program. It is not intended to diagnose MRSA infection nor to guide or monitor treatment for MRSA infections. Test performance is not FDA approved in patients less than 82 years old. Performed at [facility], [address] [place], [place] [address]      Studies: US  EKG SITE RITE Result Date: 02/22/2024 If Site Rite image not attached, placement could not be confirmed due to current cardiac rhythm.     Lashia Niese, MD  [facility] 02/24/2024  If 7PM-7AM, please contact night-coverage

## 2024-02-24 NOTE — TOC Progression Note (Addendum)
 Transition of Care [facility]) - Progression Note    Patient Details  Name: Shane Chambers MRN: [id number] Date of Birth: 1960-12-03  Transition of Care [facility]) CM/SW Contact  Valery Gaucher, [place] Phone Number: 02/24/2024, 9:55 AM  Clinical Narrative:     10:05 am Update: [facility]- confirmed patient can return today, MD aware.   9:55 am Sent message to [facility] to confirmed if patient can return today- waiting on response.       Expected Discharge Plan and Services                                               Social Determinants of Health (SDOH) Interventions SDOH Screenings   Food Insecurity: No Food Insecurity (02/21/2024)  Housing: Low Risk  (02/21/2024)  Transportation Needs: No Transportation Needs (02/21/2024)  Utilities: Not At Risk (02/21/2024)  Alcohol Screen: Low Risk  (11/05/2022)  Depression (PHQ2-9): Low Risk  (01/01/2024)  Social Connections: Unknown (02/21/2024)  Tobacco Use: Medium Risk (02/20/2024)    Readmission Risk Interventions    10/25/2023   10:46 AM 02/19/2023    1:52 PM 02/15/2023   11:15 AM  Readmission Risk Prevention Plan  Transportation Screening Complete Complete Complete  Medication Review [employer]) Complete Complete Complete  PCP or Specialist appointment within 3-5 days of discharge  Complete   HRI or Home Care Consult Complete Complete Complete  SW Recovery Care/Counseling Consult Complete Complete Complete  Palliative Care Screening Not Applicable Not Applicable Not Applicable  Skilled Nursing Facility Complete Complete Complete

## 2024-02-24 NOTE — Progress Notes (Signed)
 Subjective:  No new complaints  Antibiotics:  Anti-infectives (From admission, onward)    Start     Dose/Rate Route Frequency Ordered Stop   02/24/24 1145  ertapenem (INVANZ) 1 g in sodium chloride 0.9 % 100 mL IVPB        1 g 200 mL/hr over 30 Minutes Intravenous Every 24 hours 02/24/24 1049     02/22/24 1600  vancomycin (VANCOREADY) IVPB 1750 mg/350 mL  Status:  Discontinued       Placed in "Followed by" Linked Group   1,750 mg 175 mL/hr over 120 Minutes Intravenous Every 48 hours 02/20/24 1451 02/21/24 1121   02/22/24 1400  DAPTOmycin (CUBICIN) IVPB 700 mg/14mL premix        8 mg/kg  86.2 kg (Adjusted) 200 mL/hr over 30 Minutes Intravenous Daily 02/21/24 1121     02/22/24 1200  ciprofloxacin (CIPRO) tablet 500 mg  Status:  Discontinued        500 mg Oral 2 times daily 02/21/24 1450 02/21/24 1530   02/22/24 1200  ciprofloxacin (CIPRO) IVPB 400 mg  Status:  Discontinued        400 mg 200 mL/hr over 60 Minutes Intravenous Every 12 hours 02/21/24 1530 02/24/24 1049   02/21/24 1215  cefTRIAXone (ROCEPHIN) 2 g in sodium chloride 0.9 % 100 mL IVPB  Status:  Discontinued        2 g 200 mL/hr over 30 Minutes Intravenous Every 24 hours 02/21/24 1121 02/21/24 1440   02/20/24 1600  vancomycin (VANCOCIN) IVPB 1000 mg/200 mL premix       Placed in "Followed by" Linked Group   1,000 mg 200 mL/hr over 60 Minutes Intravenous  Once 02/20/24 1451 02/20/24 1811   02/20/24 1128  vancomycin (VANCOCIN) 1-5 GM/200ML-% IVPB       Note to Pharmacy: Landry Mellow: cabinet override      02/20/24 1128 02/20/24 2344       Medications: Scheduled Meds:  aspirin EC  81 mg Oral Q breakfast   atorvastatin  40 mg Oral QHS   carvedilol  25 mg Oral BID WC   enoxaparin (LOVENOX) injection  40 mg Subcutaneous Q24H   gabapentin  300 mg Oral BID   hydrALAZINE  100 mg Oral BID   hydrocortisone cream   Topical TID   insulin aspart  0-9 Units Subcutaneous TID WC   levothyroxine  75 mcg Oral QAC  breakfast   lubiprostone  8 mcg Oral BID WC   mometasone-formoterol  2 puff Inhalation BID   mupirocin ointment  1 Application Nasal BID   pantoprazole  40 mg Oral Daily   polyvinyl alcohol  1 drop Both Eyes Daily   sodium chloride flush  10-40 mL Intracatheter Q12H   sodium chloride flush  3 mL Intravenous Q12H   tamsulosin  0.8 mg Oral QPM   torsemide  40 mg Oral BID   traZODone  50 mg Oral QHS   Continuous Infusions:  DAPTOmycin 700 mg (02/23/24 1810)   ertapenem 1 g (02/24/24 1318)   PRN Meds:.acetaminophen **OR** acetaminophen, albuterol, alum & mag hydroxide-simeth, HYDROcodone-acetaminophen, HYDROmorphone (DILAUDID) injection, hydrOXYzine, polyethylene glycol, sodium chloride flush, sorbitol    Objective: Weight change:   Intake/Output Summary (Last 24 hours) at 02/24/2024 1522 Last data filed at 02/24/2024 0830 Gross per 24 hour  Intake --  Output 1900 ml  Net -1900 ml   Blood pressure (!) 155/59, pulse (!) 54, temperature 98.5 F (36.9 C), temperature source  Axillary, resp. rate 14, height 5\' 10"  (1.778 m), weight 106.1 kg, SpO2 97%. Temp:  [97.9 F (36.6 C)-98.8 F (37.1 C)] 98.5 F (36.9 C) (04/16 1150) Pulse Rate:  [50-60] 54 (04/16 1150) Resp:  [6-21] 14 (04/16 1150) BP: (105-174)/(47-62) 155/59 (04/16 1150) SpO2:  [91 %-99 %] 97 % (04/16 1150)  Physical Exam: Physical Exam Constitutional:      Appearance: He is well-developed.  HENT:     Head: Normocephalic and atraumatic.  Eyes:     Conjunctiva/sclera: Conjunctivae normal.  Cardiovascular:     Rate and Rhythm: Normal rate and regular rhythm.  Pulmonary:     Effort: Pulmonary effort is normal. No respiratory distress.     Breath sounds: No wheezing.  Abdominal:     General: There is no distension.     Palpations: Abdomen is soft.  Musculoskeletal:     Cervical back: Normal range of motion and neck supple.  Skin:    General: Skin is warm and dry.  Neurological:     General: No focal deficit  present.     Mental Status: He is alert and oriented to person, place, and time.  Psychiatric:        Mood and Affect: Mood normal.        Behavior: Behavior normal.        Thought Content: Thought content normal.        Judgment: Judgment normal.     Left elbow bandaged CBC:    BMET Recent Labs    02/22/24 1014 02/23/24 0346  NA 135 133*  K 4.4 4.0  CL 94* 92*  CO2 25 28  GLUCOSE 164* 164*  BUN 76* 77*  CREATININE 2.40* 2.67*  CALCIUM 8.3* 8.3*     Liver Panel  Recent Labs    02/22/24 1014 02/23/24 0346  ALBUMIN 3.5 3.1*       Sedimentation Rate No results for input(s): "ESRSEDRATE" in the last 72 hours.  C-Reactive Protein No results for input(s): "CRP" in the last 72 hours.   Micro Results: Recent Results (from the past 720 hours)  Body fluid culture w Gram Stain     Status: None   Collection Time: 02/20/24  2:28 AM   Specimen: Synovium; Body Fluid  Result Value Ref Range Status   Specimen Description   Final    SYNOVIAL Performed at [facility], [address] [place], [place] [address]    Special Requests   Final    NONE Performed at [facility], [address] [place], [place] [address]    Gram Stain   Final    ABUNDANT WBC PRESENT, PREDOMINANTLY PMN NO ORGANISMS SEEN    Culture   Final    NO GROWTH 3 DAYS Performed at [facility], [address], [place], [place] [address]    Report Status 02/23/2024 FINAL  Final  Aerobic/Anaerobic Culture w Gram Stain (surgical/deep wound)     Status: None (Preliminary result)   Collection Time: 02/20/24 12:07 PM   Specimen: Path fluid; Body Fluid  Result Value Ref Range Status   Specimen Description WOUND  Final   Special Requests LEFT ELBOW  Final   Gram Stain RARE WBC SEEN NO ORGANISMS SEEN   Final   Culture   Final    NO GROWTH 4 DAYS NO ANAEROBES ISOLATED; CULTURE IN PROGRESS FOR 5 DAYS Performed at [facility], [address], [place], [place] [address]    Report Status  PENDING  Incomplete  Culture, blood (Routine X 2) w Reflex to ID Panel     Status: None (Preliminary result)   Collection Time: 02/20/24  3:30 PM   Specimen: BLOOD RIGHT FOREARM  Result Value Ref Range Status   Specimen Description BLOOD RIGHT FOREARM  Final   Special Requests   Final    BOTTLES DRAWN AEROBIC AND ANAEROBIC Blood Culture results may not be optimal due to an inadequate volume of blood received in culture bottles   Culture   Final    NO GROWTH 4 DAYS Performed at [facility], [address], [place], [place] [address]    Report Status PENDING  Incomplete  Culture, blood (Routine X 2) w Reflex to ID Panel     Status: None (Preliminary result)   Collection Time: 02/20/24  4:59 PM   Specimen: BLOOD  Result Value Ref Range Status   Specimen Description BLOOD SITE NOT SPECIFIED  Final   Special Requests Blood Culture adequate volume  Final   Culture   Final    NO GROWTH 4 DAYS Performed at [facility], [address] [place], [place] [address]    Report Status PENDING  Incomplete  MRSA Next Gen by PCR, Nasal     Status: Abnormal   Collection Time: 02/21/24 11:01 AM   Specimen: Nasal Mucosa; Nasal Swab  Result Value Ref Range Status   MRSA by PCR Next Gen DETECTED (A) NOT DETECTED Final    Comment: CRITICAL RESULT CALLED TO, READ BACK BY AND VERIFIED WITH: RN Beatrice Lecher [id number] BY J RAZZAK, MT (NOTE) The GeneXpert MRSA Assay (FDA approved for NASAL specimens only), is one component of a comprehensive MRSA colonization surveillance program. It is not intended to diagnose MRSA infection nor to guide or monitor treatment for MRSA infections. Test performance is not FDA approved in patients less than 13 years old. Performed at [facility], [address], [place], [place] [address]     Studies/Results: [place] EKG SITE RITE Result Date: 02/22/2024 If [facility] image not attached, placement could not be confirmed due to current cardiac  rhythm.     Assessment/Plan:  INTERVAL HISTORY: Cultures are unrevealing x 4 days   Principal Problem:   Septic joint (HCC) Active Problems:   Essential hypertension   Depression   BPH (benign prostatic hyperplasia)   Coronary artery disease   S/P CABG x 5   COPD (chronic obstructive pulmonary disease) (HCC)   OSA (obstructive sleep apnea)   Cancer of sigmoid colon (HCC)   GERD without esophagitis   Morbid obesity (HCC)   QT prolongation   S/P BKA (below knee amputation) unilateral, right (HCC)   Dyslipidemia   Hypothyroidism   CKD stage 3a, GFR 45-59 ml/min (HCC)   Moderate aortic stenosis   Type II diabetes mellitus with renal manifestations (HCC)   Chronic diastolic CHF (congestive heart failure) (HCC)   Anemia of chronic renal failure   Allergy to cephalosporin    Shane Chambers is a 63 y.o. male with prior history of CHF, CAD s/p CABG, rectal cancer, COPD, DM, CKD, anemia, BPH depression, kidney stones, GERD, HLD, HTN, hypothyroidism, PVD, sleep apnea, right BKA with revision who presented to the hospital with left elbow pain and is found to have a septic arthritis status post I&D.  #1 Septic left elbow:  We have decided to go with daptomycin and ertapenem instead of the fluoroquinolone which will reduce risk of C. difficile colitis  and QT prolongation that the pro would pose  Will plan on 6-week total course of antibiotics as outlined below  Diagnosis: Septic left elbow  Culture Result: No growth  Allergies  Allergen Reactions   Ceftriaxone Anaphylaxis    Previously tolerated at [facility] during multiple admissions. Patient reports SOB reaction in [place], Summer 2024. Now reporting SOB reaction at [facility] 02/21/24. No swelling, or skin reaction. Satted well on baseline oxygen. Does not wish to receive Ceftriaxone in the future.   Ace Inhibitors Swelling and Cough   Chlorhexidine    Haloperidol And Related     Do NOT give anti-psychotics due to  risk of  Torsades and QT prolongation (per Dr. Daphane Dynes request)   Other Itching    [name]   Pork Allergy Other (See Comments)    Not listed in [facility]   Shellfish Allergy     Listed on MAR   Penicillins Itching and Rash    Tolerated ancef (12-17-22)    OPAT Orders Discharge antibiotics to be given via PICC line Discharge antibiotics: Daptomycin 700 mg IV every 24 hours and Ertapenem 1g IV every 24 hours  Duration: 6 weeks End Date: 04/01/24   [facility] Care Per Protocol:  Home health RN for IV administration and teaching; PICC line care and labs.    Labs weekly while on IV antibiotics: _x_ CBC with differential _x_ BMP  _x_ CRP _x_ ESR  x__ CK  _x_ Please pull PIC at completion of IV antibiotics __ Please leave PIC in place until doctor has seen patient or been notified  Fax weekly labs to [phone]  Clinic Follow Up Appt:    DACE DENN has an appointment on  [phone] with Dr. Ernie Heal at 345PM at  [facility] for Infectious Disease, which  is located in the [facility] at  [address] [place] in [place].  [address], which is located to the left of the elevators.  Phone: [phone]  Fax: [phone]  [email or web address]  The patient should arrive 30 minutes prior to their appoitment.  I have personally spent 50 minutes involved in face-to-face and non-face-to-face activities for this patient on the day of the visit. Professional time spent includes the following activities: Preparing to see the patient (review of tests), Obtaining and/or reviewing separately obtained history (admission/discharge record), Performing a medically appropriate examination and/or evaluation , Ordering medications/tests/procedures, referring and communicating with other health care professionals, Documenting clinical information in the EMR, Independently interpreting results (not separately reported), Communicating results to the  patient/family/caregiver, Counseling and educating the patient/family/caregiver and Care coordination (not separately reported).   Evaluation of the patient requires complex antimicrobial therapy evaluation, counseling , isolation needs to reduce disease transmission and risk assessment and mitigation.   I will followup on final cultures from OR but otherwise sign off at this time and remove from rounding list.  Please call with further questions.  Shane Chambers 02/24/2024, 3:22 PM

## 2024-02-25 DIAGNOSIS — M009 Pyogenic arthritis, unspecified: Secondary | ICD-10-CM | POA: Diagnosis not present

## 2024-02-25 LAB — GLUCOSE, CAPILLARY
Glucose-Capillary: 128 mg/dL — ABNORMAL HIGH (ref 70–99)
Glucose-Capillary: 150 mg/dL — ABNORMAL HIGH (ref 70–99)
Glucose-Capillary: 141 mg/dL — ABNORMAL HIGH (ref 70–99)

## 2024-02-25 LAB — CULTURE, BLOOD (ROUTINE X 2)
Culture: NO GROWTH
Culture: NO GROWTH
Special Requests: ADEQUATE

## 2024-02-25 MED ORDER — HYDROCORTISONE 1 % EX CREA: TOPICAL_CREAM | Freq: Three times a day (TID) | CUTANEOUS | Status: DC

## 2024-02-25 MED ORDER — ACETAMINOPHEN 500 MG PO TABS: 500.0000 mg | ORAL_TABLET | Freq: Three times a day (TID) | ORAL | Status: DC | PRN

## 2024-02-25 MED ORDER — DAPTOMYCIN IV (FOR PTA / DISCHARGE USE ONLY): 700.0000 mg | INTRAVENOUS | 0 refills | Status: AC

## 2024-02-25 MED ORDER — ERTAPENEM IV (FOR PTA / DISCHARGE USE ONLY): 1.0000 g | INTRAVENOUS | 0 refills | Status: AC

## 2024-02-25 MED ORDER — HYDROCODONE-ACETAMINOPHEN 5-325 MG PO TABS: 1.0000 | ORAL_TABLET | ORAL | 0 refills | Status: AC | PRN

## 2024-02-25 NOTE — TOC Transition Note (Signed)
 Transition of Care [facility]) - Discharge Note   Patient Details  Name: Shane Chambers MRN: [id number] Date of Birth: 1961-04-14  Transition of Care [facility]) CM/SW Contact:  Valery Gaucher, LCSW Phone Number: 02/25/2024, 3:19 PM   Clinical Narrative:     Patient will Discharge to: [facility] Discharge Date:02/25/2024 Family Notified: pt declined  Transport By: Lyna Sandhoff  Per MD patient is ready for discharge. RN, patient, and facility notified of discharge. Discharge Summary sent to facility. RN given number for [phone]. Ambulance transport requested for patient.   Clinical Social Worker signing off.  Liddie Reel, MSW, LCSW Clinical Social Worker     Final next level of care: Skilled Nursing Facility Barriers to Discharge: Barriers Resolved   Patient Goals and CMS Choice Patient states their goals for this hospitalization and ongoing recovery are:: return to SNF          Discharge Placement                       Discharge Plan and Services Additional resources added to the After Visit Summary for                                       Social Drivers of Health (SDOH) Interventions SDOH Screenings   Food Insecurity: No Food Insecurity (02/21/2024)  Housing: Low Risk  (02/21/2024)  Transportation Needs: No Transportation Needs (02/21/2024)  Utilities: Not At Risk (02/21/2024)  Alcohol Screen: Low Risk  (11/05/2022)  Depression (PHQ2-9): Low Risk  (01/01/2024)  Social Connections: Unknown (02/21/2024)  Tobacco Use: Medium Risk (02/20/2024)     Readmission Risk Interventions    10/25/2023   10:46 AM 02/19/2023    1:52 PM 02/15/2023   11:15 AM  Readmission Risk Prevention Plan  Transportation Screening Complete Complete Complete  Medication Review Oceanographer) Complete Complete Complete  PCP or Specialist appointment within 3-5 days of discharge  Complete   HRI or Home Care Consult Complete Complete Complete  SW Recovery  Care/Counseling Consult Complete Complete Complete  Palliative Care Screening Not Applicable Not Applicable Not Applicable  Skilled Nursing Facility Complete Complete Complete

## 2024-02-25 NOTE — Progress Notes (Signed)
 Soap sud enema given as per the order.

## 2024-02-25 NOTE — Care Management Important Message (Signed)
 Important Message  Patient Details  Name: Shane Chambers MRN: [id number] Date of Birth: 1960-11-18   Important Message Given:  Yes - Medicare IM     Janith Melnick 02/25/2024, 11:13 AM

## 2024-02-25 NOTE — Discharge Summary (Signed)
 Physician Discharge Summary  Shane Chambers WUJ:[id number] DOB: Feb 16, 1961 DOA: 02/19/2024  PCP: [name], MD  Admit date: 02/19/2024 Discharge date: 02/25/2024  Admitted From: Skilled nursing facility  Discharge disposition: skilled nursing facility   Recommendations for Outpatient Follow-Up:   Follow up with your primary care provider at the skilled nursing facility in 3 to 5 days Follow-up with infectious disease clinic as scheduled by the clinic on 03/28/2024 at 3:45 PM with Dr Daiva Eves..  Continue antibiotics via PICC line until 04/01/2024.  Obtain weekly labs with CBC with differential, BMP, CRP, ESR and CK which will need to be faxed to [phone]. No restriction on elbow movement left   Discharge Diagnosis:   Principal Problem:   Septic joint (HCC) Active Problems:   QT prolongation   Depression   Essential hypertension   COPD (chronic obstructive pulmonary disease) (HCC)   Hypothyroidism   Type II diabetes mellitus with renal manifestations (HCC)   Chronic diastolic CHF (congestive heart failure) (HCC)   CKD stage 3a, GFR 45-59 ml/min (HCC)   BPH (benign prostatic hyperplasia)   Coronary artery disease   S/P CABG x 5   OSA (obstructive sleep apnea)   Cancer of sigmoid colon (HCC)   GERD without esophagitis   Morbid obesity (HCC)   S/P BKA (below knee amputation) unilateral, right (HCC)   Dyslipidemia   Moderate aortic stenosis   Anemia of chronic renal failure   Allergy to cephalosporin   Discharge Condition: Improved.  Diet recommendation: Low sodium, heart healthy.  Carbohydrate-modified.  Wound care: Local elbow care  Code status: Full.   History of Present Illness:   Patient is a 63 years old male with past medical history of hypertension, hyperlipidemia, GERD, CKD stage IV, CAD, COPD, hypothyroidism, diabetes mellitus, aortic stenosis, chronic diastolic heart failure, history of colon cancer, depression, obesity and sleep apnea presented to  the hospital with septic left elbow joint after he had irrigation and debridement by orthopedic team on 02/20/2024.  He did have allergic reaction to Rocephin and infectious disease was consulted.  During hospitalization, patient also had difficulty breathing.  Allergic reaction has improved.   Hospital Course:   Following conditions were addressed during hospitalization as listed below,  Septic left elbow joint Patient presented with left elbow pain.  Cell count of synovial fluid showed 50,000 white cells.  Seen by orthopedics and underwent irrigation and debridement of the left elbow wound. Gram stain and culture with no organisms so far.  Blood cultures negative in 4 days.  Initial ESR 55 CRP 2.8.  WBC at 10.8.   Infectious disease was consulted and plan is to continue daptomycin and Invanz until 04/01/2024.  Status post PICC line.    Hypertension Continue Coreg   Hyperlipidemia Continue Lipitor   GERD Continue omeprazole   Mild AKI on CKD 4 Creatinine at 2.6, baseline   CAD No chest pain or shortness of breath.     COPD Continue bronchodilators.   Hypothyroidism Continue Synthroid   Diabetes mellitus Continue sliding scale insulin   AS/Chronic diastolic CHF > Last echo in December 2024 with EF 55-60%, G3 DD, mildly reduced RV function, only mild aortic stenosis. Compensated.   Anemia Hemoglobin stable at 9.3   BPH Neurogenic Bladder Continue tamsulosin. Has suprapubic catheter placement planned as outpatient.  Currently has a Foley catheter in place.  Will need to address this after discharge.   Depression Continue hydroxyzine and trazodone   Class I obesity Body mass index is  33.58 kg/m.  Might benefit from lifestyle modifications and weight loss as outpatient.   OSA Continue CPAP   QT prolongation Improved   Hypomagnesemia: Replenished and improved.  Latest magnesium of 1.9   Hyperphosphatemia: Continue renal diet.   History of colon cancer No acute  issues   Status post right BKA - Noted    Disposition.  At this time, patient is stable for disposition to nursing facility with ID followup  Medical Consultants:   Orthopedics Infectious disease  Procedures:    irrigation and debridement of the left elbow on 4/12 Subjective:   Today, patient was seen and examined at bedside.  Denies any complaints  Discharge Exam:   Vitals:   02/24/24 2302 02/25/24 0330  BP: (!) 104/50 (!) 105/55  Pulse: 60 60  Resp: 16 15  Temp: 98.5 F (36.9 C) 98.7 F (37.1 C)  SpO2: 92% 94%   Vitals:   02/24/24 1600 02/24/24 1938 02/24/24 2302 02/25/24 0330  BP: 139/62 (!) 114/53 (!) 104/50 (!) 105/55  Pulse: (!) 58 (!) 59 60 60  Resp: 14 14 16 15   Temp: 98.1 F (36.7 C) 98.4 F (36.9 C) 98.5 F (36.9 C) 98.7 F (37.1 C)  TempSrc: Oral Oral Oral Oral  SpO2: 94% 96% 92% 94%  Weight:      Height:        General: Alert awake, not in obvious distress, obese HENT: pupils equally reacting to light,  No scleral pallor or icterus noted. Oral mucosa is moist.  Chest:  Diminished breath sounds bilaterally. No crackles or wheezes.  CVS: S1 &S2 heard. No murmur.  Regular rate and rhythm. Abdomen: Soft, nontender, nondistended.  Bowel sounds are heard.  Foley catheter in place Extremities: No cyanosis, right upper extremity PICC Psych: Alert, awake and oriented, flat mood CNS:  No cranial nerve deficits.  Moves extremities Skin: Warm and dry.  No rashes noted.   left elbow with dressing  The results of significant diagnostics from this hospitalization (including imaging, microbiology, ancillary and laboratory) are listed below for reference.     Diagnostic Studies:   DG Elbow Complete Left Result Date: 02/20/2024 CLINICAL DATA:  Pain EXAM: LEFT ELBOW - COMPLETE 3+ VIEW COMPARISON:  None Available. FINDINGS: Mild spurring in the elbow joint. No acute bony abnormality. Specifically, no fracture, subluxation, or dislocation. Left elbow joint  effusion. IMPRESSION: Degenerative changes with elbow joint effusion. No visible fracture. If there is clinical concern for occult fracture, consider immobilization and repeat imaging in 1 week if symptoms persist. Electronically Signed   By: Charlett Nose M.D.   On: 02/20/2024 00:05     Labs:   Basic Metabolic Panel: Recent Labs  Lab 02/19/24 2334 02/20/24 1659 02/21/24 0854 02/22/24 1014 02/23/24 0346  NA 135 136 137 135 133*  K 4.0 3.6 4.0 4.4 4.0  CL 97* 99 101 94* 92*  CO2 23 25 19* 25 28  GLUCOSE 140* 122* 97 164* 164*  BUN 73* 69* 76* 76* 77*  CREATININE 2.44* 2.44* 2.75* 2.40* 2.67*  CALCIUM 7.9* 7.6* 7.6* 8.3* 8.3*  MG  --   --   --  1.1* 1.9  PHOS  --   --   --  5.2* 4.6   GFR Estimated Creatinine Clearance: 35.4 mL/min (A) (by C-G formula based on SCr of 2.67 mg/dL (H)). Liver Function Tests: Recent Labs  Lab 02/20/24 1659 02/21/24 0854 02/22/24 1014 02/23/24 0346  AST 13* 14*  --   --  ALT 8 7  --   --   ALKPHOS 72 66  --   --   BILITOT 1.0 0.8  --   --   PROT 6.8 7.1  --   --   ALBUMIN 3.3* 3.4* 3.5 3.1*   No results for input(s): "LIPASE", "AMYLASE" in the last 168 hours. No results for input(s): "AMMONIA" in the last 168 hours. Coagulation profile No results for input(s): "INR", "PROTIME" in the last 168 hours.  CBC: Recent Labs  Lab 02/19/24 2334 02/20/24 1530 02/21/24 0854 02/22/24 1014 02/23/24 0346  WBC 11.8* 12.0* 9.9 9.8 10.8*  NEUTROABS 9.1*  --   --  8.5* 8.4*  HGB 10.3* 10.8* 9.7* 10.1* 9.3*  HCT 31.1* 32.5* 30.5* 30.3* 27.7*  MCV 88.9 89.3 91.9 88.9 89.1  PLT 213 241 178 231 230   Cardiac Enzymes: Recent Labs  Lab 02/22/24 0355  CKTOTAL 144   BNP: Invalid input(s): "POCBNP" CBG: Recent Labs  Lab 02/24/24 0828 02/24/24 1145 02/24/24 1709 02/24/24 2115 02/25/24 0551  GLUCAP 126* 120* 114* 189* 128*   D-Dimer No results for input(s): "DDIMER" in the last 72 hours. Hgb A1c No results for input(s): "HGBA1C" in the  last 72 hours. Lipid Profile No results for input(s): "CHOL", "HDL", "LDLCALC", "TRIG", "CHOLHDL", "LDLDIRECT" in the last 72 hours. Thyroid function studies No results for input(s): "TSH", "T4TOTAL", "T3FREE", "THYROIDAB" in the last 72 hours.  Invalid input(s): "FREET3" Anemia work up No results for input(s): "VITAMINB12", "FOLATE", "FERRITIN", "TIBC", "IRON", "RETICCTPCT" in the last 72 hours. Microbiology Recent Results (from the past 240 hours)  Body fluid culture w Gram Stain     Status: None   Collection Time: 02/20/24  2:28 AM   Specimen: Synovium; Body Fluid  Result Value Ref Range Status   Specimen Description   Final    SYNOVIAL Performed at [facility], [address], [place], [place] [address]    Special Requests   Final    NONE Performed at [facility], [address] [place], [place] [address]    Gram Stain   Final    ABUNDANT WBC PRESENT, PREDOMINANTLY PMN NO ORGANISMS SEEN    Culture   Final    NO GROWTH 3 DAYS Performed at [facility], [address], [place], [place] [address]    Report Status 02/23/2024 FINAL  Final  Aerobic/Anaerobic Culture w Gram Stain (surgical/deep wound)     Status: None (Preliminary result)   Collection Time: 02/20/24 12:07 PM   Specimen: Path fluid; Body Fluid  Result Value Ref Range Status   Specimen Description WOUND  Final   Special Requests LEFT ELBOW  Final   Gram Stain RARE WBC SEEN NO ORGANISMS SEEN   Final   Culture   Final    NO GROWTH 4 DAYS NO ANAEROBES ISOLATED; CULTURE IN PROGRESS FOR 5 DAYS Performed at [facility], [address], [place], [place] [address]    Report Status PENDING  Incomplete  Culture, blood (Routine X 2) w Reflex to ID Panel     Status: None   Collection Time: 02/20/24  3:30 PM   Specimen: BLOOD RIGHT FOREARM  Result Value Ref Range Status   Specimen Description BLOOD RIGHT FOREARM  Final   Special Requests   Final    BOTTLES DRAWN AEROBIC AND ANAEROBIC Blood Culture  results may not be optimal due to an inadequate volume of blood received in culture bottles   Culture   Final  NO GROWTH 5 DAYS Performed at [facility], [address], [place], [place] [address]    Report Status 02/25/2024 FINAL  Final  Culture, blood (Routine X 2) w Reflex to ID Panel     Status: None   Collection Time: 02/20/24  4:59 PM   Specimen: BLOOD  Result Value Ref Range Status   Specimen Description BLOOD SITE NOT SPECIFIED  Final   Special Requests Blood Culture adequate volume  Final   Culture   Final    NO GROWTH 5 DAYS Performed at [facility], [address] [place], [place] [address]    Report Status 02/25/2024 FINAL  Final  MRSA Next Gen by PCR, Nasal     Status: Abnormal   Collection Time: 02/21/24 11:01 AM   Specimen: Nasal Mucosa; Nasal Swab  Result Value Ref Range Status   MRSA by PCR Next Gen DETECTED (A) NOT DETECTED Final    Comment: CRITICAL RESULT CALLED TO, READ BACK BY AND VERIFIED WITH: RN Beatrice Lecher [phone] [id number] BY J RAZZAK, MT (NOTE) The GeneXpert MRSA Assay (FDA approved for NASAL specimens only), is one component of a comprehensive MRSA colonization surveillance program. It is not intended to diagnose MRSA infection nor to guide or monitor treatment for MRSA infections. Test performance is not FDA approved in patients less than 42 years old. Performed at [facility], [address] [place], [place] [address]      Discharge Instructions:   Discharge Instructions     [facility] pharmacist to adjust dose for Vancomycin, Aminoglycosides and other anti-infective therapies as requested by physician.   Complete by: As directed    [facility] to provide Cath Flo 2mg    Complete by: As directed    Administer for PICC line occlusion and as ordered by physician for other access device issues.   Anaphylaxis Kit: Provided to treat any anaphylactic reaction to the medication being provided to the patient  if First Dose or when requested by physician   Complete by: As directed    Epinephrine 1mg /ml vial / amp: Administer 0.3mg  (0.50ml) subcutaneously once for moderate to severe anaphylaxis, nurse to call physician and pharmacy when reaction occurs and call 911 if needed for immediate care   Diphenhydramine 50mg /ml IV vial: Administer 25-50mg  IV/IM PRN for first dose reaction, rash, itching, mild reaction, nurse to call physician and pharmacy when reaction occurs   Sodium Chloride 0.9% NS IV: Administer if needed for hypovolemic blood pressure drop or as ordered by physician after call to physician with anaphylactic reaction   Call MD for:  persistant nausea and vomiting   Complete by: As directed    Call MD for:  severe uncontrolled pain   Complete by: As directed    Call MD for:  temperature >100.4   Complete by: As directed    Change dressing on IV access line weekly and PRN   Complete by: As directed    Diet - low sodium heart healthy   Complete by: As directed    Diet Carb Modified   Complete by: As directed    Discharge instructions   Complete by: As directed    Follow-up with your primary care provider at the skilled nursing facility in 3 to 5 days.  Check blood work as advised while on antibiotics.  Follow-up with infectious disease clinic as has been scheduled.  Seek medical attention for worsening symptoms.   Discharge wound care:  Complete by: As directed    Apply dressing  As needed    Just any regular dressing is fine. The Steri steps can just stay where they are or you can take the loose ones off   Flush IV access with Sodium Chloride 0.9% and Heparin 10 units/ml or 100 units/ml   Complete by: As directed    Home infusion instructions - [facility]   Complete by: As directed    Instructions: Flush IV access with Sodium Chloride 0.9% and Heparin 10units/ml or 100units/ml   Change dressing on IV access line: Weekly and PRN   Instructions Cath Flo 2mg :  Administer for PICC Line occlusion and as ordered by physician for other access device   [facility] pharmacist to adjust dose for: Vancomycin, Aminoglycosides and other anti-infective therapies as requested by physician   Increase activity slowly   Complete by: As directed    Method of administration may be changed at the discretion of home infusion pharmacist based upon assessment of the patient and/or caregiver's ability to self-administer the medication ordered   Complete by: As directed       Allergies as of 02/25/2024       Reactions   Ceftriaxone Anaphylaxis   Previously tolerated at [facility] during multiple admissions. Patient reports SOB reaction in [place], Summer 2024. Now reporting SOB reaction at [facility] 02/21/24. No swelling, or skin reaction. Satted well on baseline oxygen. Does not wish to receive Ceftriaxone in the future.   Ace Inhibitors Swelling, Cough   Chlorhexidine    Haloperidol And Related    Do NOT give anti-psychotics due to risk of  Torsades and QT prolongation (per Dr. Teena Irani request)   Other Itching   [name]   Pork Allergy Other (See Comments)   Not listed in [facility]   Shellfish Allergy    Listed on MAR   Penicillins Itching, Rash   Tolerated ancef (12-17-22)        Medication List     STOP taking these medications    budesonide-formoterol 160-4.5 MCG/ACT inhaler Commonly known as: Symbicort       TAKE these medications    acetaminophen 500 MG tablet Commonly known as: TYLENOL Take 1 tablet (500 mg total) by mouth every 8 (eight) hours as needed for fever or moderate pain (pain score 4-6). What changed: how much to take   albuterol 108 (90 Base) MCG/ACT inhaler Commonly known as: VENTOLIN HFA Inhale 2 puffs into the lungs every 6 (six) hours as needed for wheezing or shortness of breath.   aspirin EC 81 MG tablet Take 1 tablet (81 mg total) by mouth daily with breakfast.   atorvastatin 40 MG tablet Commonly  known as: LIPITOR Take 1 tablet (40 mg total) by mouth at bedtime.   bisacodyl 5 MG EC tablet Commonly known as: bisacodyl Take 1 tablet (5 mg total) by mouth daily as needed for moderate constipation.   carvedilol 25 MG tablet Commonly known as: COREG Take 1 tablet (25 mg total) by mouth 2 (two) times daily with a meal.   cetirizine 10 MG chewable tablet Commonly known as: ZYRTEC Chew 10 mg by mouth daily.   daptomycin IVPB Commonly known as: CUBICIN Inject 700 mg into the vein daily. Indication:  Septic Joint  First Dose: Yes Last Day of Therapy:  04/01/24 Labs - Once weekly:  CBC/D, BMP, and CPK Labs - Once weekly: ESR and CRP Method of administration: IV Push Method of administration may  be changed at the discretion of home infusion pharmacist based upon assessment of the patient and/or caregiver's ability to self-administer the medication ordered.   ertapenem IVPB Commonly known as: INVANZ Inject 1 g into the vein daily. Indication:  Septic joint First Dose: Yes Last Day of Therapy:  04/01/24 Labs - Once weekly:  CBC/D and BMP, Labs - Once weekly: ESR and CRP Method of administration: Mini-Bag Plus / Gravity Method of administration may be changed at the discretion of home infusion pharmacist based upon assessment of the patient and/or caregiver's ability to self-administer the medication ordered.   ferrous gluconate 324 MG tablet Commonly known as: FERGON Take 324 mg by mouth daily.   fluticasone-salmeterol 500-50 MCG/ACT Aepb Commonly known as: ADVAIR Inhale 1 puff into the lungs in the morning and at bedtime.   gabapentin 300 MG capsule Commonly known as: NEURONTIN Take 300 mg by mouth 2 (two) times daily.   hydrALAZINE 100 MG tablet Commonly known as: APRESOLINE Take 1 tablet (100 mg total) by mouth 2 (two) times daily.   HYDROcodone-acetaminophen 5-325 MG tablet Commonly known as: NORCO/VICODIN Take 1 tablet by mouth every 4 (four) hours as needed for up  to 14 days for moderate pain (pain score 4-6) or severe pain (pain score 7-10).   hydrocortisone cream 1 % Apply topically 3 (three) times daily. Apply to itching areas   hydrOXYzine 25 MG tablet Commonly known as: ATARAX Take 1 tablet (25 mg total) by mouth 3 (three) times daily as needed for anxiety. What changed: when to take this   ipratropium-albuterol 0.5-2.5 (3) MG/3ML Soln Commonly known as: DUONEB Take 3 mLs by nebulization every 6 (six) hours as needed. What changed: reasons to take this   levothyroxine 75 MCG tablet Commonly known as: SYNTHROID Take 75 mcg by mouth daily before breakfast.   lubiprostone 8 MCG capsule Commonly known as: Amitiza Take 1 capsule (8 mcg total) by mouth 2 (two) times daily with a meal.   metolazone 2.5 MG tablet Commonly known as: ZAROXOLYN Take 1 tablet (2.5 mg total) by mouth once a week. On Wednesdays   NovoLOG FlexPen 100 UNIT/ML FlexPen Generic drug: insulin aspart 0-15 Units, Subcutaneous, 3 times daily with meals CBG < 70: implement hypoglycemia protocol-call MD CBG 70 - 120: 0 units CBG 121 - 150: 2 units CBG 151 - 200: 3 units CBG 201 - 250: 5 units CBG 251 - 300: 8 units CBG 301 - 350: 11 units CBG 351 - 400: 15 units CBG > 400: What changed:  how much to take how to take this when to take this additional instructions   omeprazole 20 MG capsule Commonly known as: PRILOSEC Take 20 mg by mouth daily.   polyethylene glycol 17 g packet Commonly known as: MIRALAX / GLYCOLAX Take 17 g by mouth daily as needed for moderate constipation.   polyvinyl alcohol 1.4 % ophthalmic solution Commonly known as: LIQUIFILM TEARS Place 1 drop into both eyes daily.   potassium chloride 10 MEQ tablet Commonly known as: KLOR-CON Take 10 mEq by mouth daily.   tamsulosin 0.4 MG Caps capsule Commonly known as: FLOMAX Take 0.8 mg by mouth every evening.   thiamine 100 MG tablet Commonly known as: Vitamin B-1 Take 1 tablet (100 mg  total) by mouth daily.   torsemide 20 MG tablet Commonly known as: DEMADEX Take 2 tablets (40 mg total) by mouth 2 (two) times daily.   traZODone 100 MG tablet Commonly known as: DESYREL Take 100 mg  by mouth at bedtime.   Vitamin D (Ergocalciferol) 1.25 MG (50000 UNIT) Caps capsule Commonly known as: DRISDOL Take 50,000 Units by mouth every 7 (seven) days. (Mondays)               Discharge Care Instructions  (From admission, onward)           Start     Ordered   02/25/24 0000  Change dressing on IV access line weekly and PRN  (Home infusion instructions - [facility] )        02/25/24 0744   02/25/24 0000  Discharge wound care:       Comments: Apply dressing  As needed    Just any regular dressing is fine. The Steri steps can just stay where they are or you can take the loose ones off   02/25/24 0842            Contact information for follow-up providers     Darcel Early, MD .   Specialty: Internal Medicine Contact information: [address] [place] [address] [phone]         Ernie Heal, Jerelyn Money, MD Follow up on 03/28/2024.   Specialty: Infectious Diseases Why: 3: 45PM Contact information: [address] [place] [address] [phone]              Contact information for after-discharge care     Destination     [facility] .   Service: Skilled Nursing Contact information: [address] [place]   [address] [phone]                      Time coordinating discharge: 39 minutes  Signed:  Jackquline Branca  [facility] 02/25/2024, 8:53 AM

## 2024-02-25 NOTE — Progress Notes (Addendum)
 Hand off report given to the staff of [facility] AVS and medication scripts placed in the discharge packet, Patient has a stage 1 pressure injury, report given, new dressing applied.  Patient is discharging with the Foley (placed on 02/16/2023) and PICC line for I/V antibiotic, he doesn't have any complaints, has a BM early in the morning after giving him soap sud enema.   Patient had money in security in a locked box, money given to the patient by NT staff, RN verified.

## 2024-02-25 NOTE — TOC Progression Note (Signed)
 Transition of Care [facility]) - Progression Note    Patient Details  Name: Shane Chambers MRN: [id number] Date of Birth: 06-12-1961  Transition of Care [facility]) CM/SW Contact  Valery Gaucher, [place] Phone Number: 02/25/2024, 3:12 PM  Clinical Narrative:     CSW met with patient at bedside. CSW introduce self and explained role. Patient confirmed he arrived from [facility] and agrees to return.  Patient declined contacting family.   Liddie Reel, MSW, LCSW Clinical Social Worker    Expected Discharge Plan: Skilled Nursing Facility Barriers to Discharge: Other (must enter comment)  Expected Discharge Plan and Services         Expected Discharge Date: 02/25/24                                     Social Determinants of Health (SDOH) Interventions SDOH Screenings   Food Insecurity: No Food Insecurity (02/21/2024)  Housing: Low Risk  (02/21/2024)  Transportation Needs: No Transportation Needs (02/21/2024)  Utilities: Not At Risk (02/21/2024)  Alcohol Screen: Low Risk  (11/05/2022)  Depression (PHQ2-9): Low Risk  (01/01/2024)  Social Connections: Unknown (02/21/2024)  Tobacco Use: Medium Risk (02/20/2024)    Readmission Risk Interventions    10/25/2023   10:46 AM 02/19/2023    1:52 PM 02/15/2023   11:15 AM  Readmission Risk Prevention Plan  Transportation Screening Complete Complete Complete  Medication Review Oceanographer) Complete Complete Complete  PCP or Specialist appointment within 3-5 days of discharge  Complete   HRI or Home Care Consult Complete Complete Complete  SW Recovery Care/Counseling Consult Complete Complete Complete  Palliative Care Screening Not Applicable Not Applicable Not Applicable  Skilled Nursing Facility Complete Complete Complete

## 2024-02-26 ENCOUNTER — Ambulatory Visit (HOSPITAL_COMMUNITY)
Admission: RE | Admit: 2024-02-26 | Discharge: 2024-02-26 | Disposition: A | Discharge status: Home / Self Care | Source: Ambulatory Visit | Attending: Urology | Admitting: Urology

## 2024-02-26 LAB — AEROBIC/ANAEROBIC CULTURE W GRAM STAIN (SURGICAL/DEEP WOUND)

## 2024-02-27 ENCOUNTER — Telehealth: Payer: Self-pay | Admitting: Infectious Disease

## 2024-02-27 NOTE — Telephone Encounter (Signed)
 The patient did not know the he did find an organism from his culture a cutibacterium this is covered by both of the antibiotics he is currently on.  I would recommend continuing both of them though

## 2024-03-08 LAB — BASIC METABOLIC PANEL (CC13): EGFR: 33

## 2024-03-10 ENCOUNTER — Other Ambulatory Visit: Payer: Self-pay | Admitting: Radiology

## 2024-03-10 NOTE — H&P (Addendum)
 Chief Complaint: Chronic urinary retention; request for image guided suprapubic catheter  Referring Provider(s): Lauretta Ponto   Supervising Physician: Marland Silvas  Patient Status: [facility] - Out-pt  History of Present Illness: Shane Chambers is a 63 y.o. male with PMH significant for CAD (CABG 2021), rectal CA, COPD, DM, OSA, HTN, HLD, BPH with symptomatic bladder outlet obstruction, kidney stones. He has required foley for urinary retention since 02/2023 and has failed voiding trial since then per urology note. At most recent visit 02/16/24, urology spoke to patient about SP placement to preserve urethral sphincter. Pt is agreeable and request for IR placed SP tube was made.   He arrives from his residence at [facility] today. He has been NPO and denies any new complaint today. See full ROS below.   Patient is Full Code  Past Medical History:  Diagnosis Date   Acute on chronic combined systolic and diastolic CHF (congestive heart failure) (HCC) 03/05/2020   Acute on chronic heart failure with preserved ejection fraction (HFpEF) (HCC) 03/27/2023   Anemia    Arthritis    CAD (coronary artery disease)    a. s/p CABG in 03/2020 with LIMA-LAD, RIMA-PL, RA-[id number]-OM1   Cancer (HCC)    rectal   Cellulitis    COPD (chronic obstructive pulmonary disease) (HCC)    Depression    Diabetes mellitus without complication (HCC)    GERD (gastroesophageal reflux disease)    History of kidney stones    Hyperlipidemia 12/01/2019   Hypertension    Hypothyroidism    Ischemic cardiomyopathy    a. EF 20-25% by echo in 02/2020 b. at 40% by echo in 03/2020 c. EF normalized to 60-65% by echo in 04/2020   Myocardial infarction [facility])    Peripheral vascular disease (HCC)    Sleep apnea    Type 2 diabetes mellitus (HCC)     Past Surgical History:  Procedure Laterality Date   AMPUTATION Right 10/05/2022   Procedure: AMPUTATION BELOW KNEE;  Surgeon: Timothy Ford, MD;  Location: [facility] OR;   Service: Orthopedics;  Laterality: Right;   AMPUTATION Right 11/14/2022   Procedure: AMPUTATION BELOW KNEE REVISION; WASHOUT;  Surgeon: Jackquelyn Mass, MD;  Location: [facility] ORS;  Service: Vascular;  Laterality: Right;   AMPUTATION Right 11/18/2022   Procedure: AMPUTATION BELOW KNEE REVISION AND CLOSURE;  Surgeon: Jackquelyn Mass, MD;  Location: [facility] ORS;  Service: Vascular;  Laterality: Right;   APPLICATION OF WOUND VAC Right 10/05/2022   Procedure: APPLICATION OF WOUND VAC;  Surgeon: Timothy Ford, MD;  Location: [facility] OR;  Service: Orthopedics;  Laterality: Right;   BIOPSY  07/18/2021   Procedure: BIOPSY;  Surgeon: Vinetta Greening, DO;  [facility];  Service: Endoscopy;;   BIOPSY  01/09/2022   Procedure: BIOPSY;  Surgeon: Vinetta Greening, DO;  [facility];  Service: Endoscopy;;   BIOPSY  06/29/2023   Procedure: BIOPSY;  Surgeon: Vinetta Greening, DO;  [facility];  Service: Endoscopy;;   COLONOSCOPY WITH PROPOFOL  N/A 07/18/2021   Carver: 15 millimeter polyp removed from the sigmoid colon, 5 mm polyp removed from sigmoid colon.  Nonbleeding internal hemorrhoids.  Significant looping of the colon. sigmoid path showed invasive colonic adenocarcinoma involving tubular adenoma (invades to depth of 2mm, carcinoma 1mm from margin, no lymphovascular invasion, no poorly differentiated component.   COLONOSCOPY WITH PROPOFOL  N/A 06/29/2023   Procedure: COLONOSCOPY WITH PROPOFOL ;  Surgeon: Vinetta Greening, DO;  [facility];  Service: Endoscopy;  Laterality: N/A;  7:30 am, asa 3   CORONARY ARTERY BYPASS GRAFT N/A 03/13/2020   Procedure: CORONARY ARTERY BYPASS GRAFTING (CABG) times five using bilateral Internal mammary arteries and left radial artery;  Surgeon: Rudine Cos, MD;  Location: [facility] OR;  Service: Open Heart Surgery;  Laterality: N/A;   DENTAL SURGERY     ESOPHAGOGASTRODUODENOSCOPY (EGD) WITH PROPOFOL  N/A 07/18/2021   Carver: 1 gastric polyp  status post biopsy, gastritis. gastric bx with slight chronic inflammation and no H.pyori. GEJ polypectomy with mild inflammation only   FLEXIBLE SIGMOIDOSCOPY N/A 08/26/2021   Carver: Nonbleeding internal hemorrhoids.  15 mm ulcers from previous polypectomy found in the rectum.  No evidence of previous polyp.  Located 5 to 8 cm from anal verge.   FLEXIBLE SIGMOIDOSCOPY N/A 01/09/2022   Procedure: FLEXIBLE SIGMOIDOSCOPY;  Surgeon: Vinetta Greening, DO;  [facility];  Service: Endoscopy;  Laterality: N/A;   IRRIGATION AND DEBRIDEMENT ELBOW Left 02/20/2024   Procedure: LEFT ELBOW IRRIGATION AND DEBRIDEMENT;  Surgeon: Marilee Showers, MD;  Location: [facility] OR;  Service: Orthopedics;  Laterality: Left;   POLYPECTOMY  07/18/2021   Procedure: POLYPECTOMY INTESTINAL;  Surgeon: Vinetta Greening, DO;  [facility];  Service: Endoscopy;;   POLYPECTOMY  06/29/2023   Procedure: POLYPECTOMY INTESTINAL;  Surgeon: Vinetta Greening, DO;  [facility];  Service: Endoscopy;;   RADIAL ARTERY HARVEST Left 03/13/2020   Procedure: RADIAL ARTERY HARVEST,;  Surgeon: Rudine Cos, MD;  Location: [facility] OR;  Service: Open Heart Surgery;  Laterality: Left;   RIGHT/LEFT HEART CATH AND CORONARY ANGIOGRAPHY N/A 03/07/2020   Procedure: RIGHT/LEFT HEART CATH AND CORONARY ANGIOGRAPHY;  Surgeon: Arnoldo Lapping, MD;  Location: [facility] INVASIVE CV LAB;  Service: Cardiovascular;  Laterality: N/A;   STUMP REVISION Right 12/17/2022   Procedure: RIGHT BELOW KNEE AMPUTATION REVISION;  Surgeon: Timothy Ford, MD;  Location: [facility] OR;  Service: Orthopedics;  Laterality: Right;   SUBMUCOSAL LIFTING INJECTION  01/09/2022   Procedure: SUBMUCOSAL LIFTING INJECTION;  Surgeon: Vinetta Greening, DO;  [facility];  Service: Endoscopy;;   SUBMUCOSAL TATTOO INJECTION  01/09/2022   Procedure: SUBMUCOSAL TATTOO INJECTION;  Surgeon: Vinetta Greening, DO;  [facility];  Service: Endoscopy;;   TEE WITHOUT  CARDIOVERSION N/A 03/13/2020   Procedure: TRANSESOPHAGEAL ECHOCARDIOGRAM (TEE);  Surgeon: Rudine Cos, MD;  Location: [facility] OR;  Service: Open Heart Surgery;  Laterality: N/A;    Allergies: Ceftriaxone , Ace inhibitors, Chlorhexidine , Haloperidol  and related, Other, Pork allergy, Shellfish allergy, and Penicillins  Medications: Prior to Admission medications   Medication Sig Start Date End Date Taking? Authorizing Provider  acetaminophen  (TYLENOL ) 500 MG tablet Take 1 tablet (500 mg total) by mouth every 8 (eight) hours as needed for fever or moderate pain (pain score 4-6). 02/25/24   Pokhrel, Amador Bad, MD  albuterol  (VENTOLIN  HFA) 108 (90 Base) MCG/ACT inhaler Inhale 2 puffs into the lungs every 6 (six) hours as needed for wheezing or shortness of breath. Patient not taking: Reported on 02/20/2024 05/02/22   Rayfield Cairo, MD  aspirin  81 MG EC tablet Take 1 tablet (81 mg total) by mouth daily with breakfast. 11/29/20   Colin Dawley, MD  atorvastatin  (LIPITOR ) 40 MG tablet Take 1 tablet (40 mg total) by mouth at bedtime. 11/28/22   Althia Atlas, MD  bisacodyl  5 MG EC tablet Take 1 tablet (5 mg total) by mouth daily as needed for moderate constipation. 05/28/23  Vinetta Greening, DO  carvedilol  (COREG ) 25 MG tablet Take 1 tablet (25 mg total) by mouth 2 (two) times daily with a meal. 04/02/23   Ozell Blunt, MD  cetirizine (ZYRTEC) 10 MG chewable tablet Chew 10 mg by mouth daily.    [provider]  daptomycin  (CUBICIN ) IVPB Inject 700 mg into the vein daily. Indication:  Septic Joint  First Dose: Yes Last Day of Therapy:  04/01/24 Labs - Once weekly:  CBC/D, BMP, and CPK Labs - Once weekly: ESR and CRP Method of administration: IV Push Method of administration may be changed at the discretion of home infusion pharmacist based upon assessment of the patient and/or caregiver's ability to self-administer the medication ordered. 02/25/24 04/02/24  Pokhrel, Amador Bad, MD  ertapenem   (INVANZ ) IVPB Inject 1 g into the vein daily. Indication:  Septic joint First Dose: Yes Last Day of Therapy:  04/01/24 Labs - Once weekly:  CBC/D and BMP, Labs - Once weekly: ESR and CRP Method of administration: Mini-Bag Plus / Gravity Method of administration may be changed at the discretion of home infusion pharmacist based upon assessment of the patient and/or caregiver's ability to self-administer the medication ordered. 02/25/24 04/02/24  Pokhrel, Amador Bad, MD  ferrous gluconate  (FERGON) 324 MG tablet Take 324 mg by mouth daily.    [provider]  fluticasone -salmeterol (ADVAIR) 500-50 MCG/ACT AEPB Inhale 1 puff into the lungs in the morning and at bedtime.    [provider]  gabapentin  (NEURONTIN ) 300 MG capsule Take 300 mg by mouth 2 (two) times daily. 10/13/23   [provider]  hydrALAZINE  (APRESOLINE ) 100 MG tablet Take 1 tablet (100 mg total) by mouth 2 (two) times daily. 10/31/23   Justina Oman, MD  HYDROcodone -acetaminophen  (NORCO/VICODIN) 5-325 MG tablet Take 1 tablet by mouth every 4 (four) hours as needed for up to 14 days for moderate pain (pain score 4-6) or severe pain (pain score 7-10). 02/25/24 03/10/24  Pokhrel, Amador Bad, MD  hydrocortisone  cream 1 % Apply topically 3 (three) times daily. Apply to itching areas 02/25/24   Pokhrel, Laxman, MD  hydrOXYzine  (ATARAX ) 25 MG tablet Take 1 tablet (25 mg total) by mouth 3 (three) times daily as needed for anxiety. Patient taking differently: Take 25 mg by mouth every 8 (eight) hours as needed for anxiety. 10/31/23   Justina Oman, MD  insulin  aspart (NOVOLOG  FLEXPEN) 100 UNIT/ML FlexPen 0-15 Units, Subcutaneous, 3 times daily with meals CBG < 70: implement hypoglycemia protocol-call MD CBG 70 - 120: 0 units CBG 121 - 150: 2 units CBG 151 - 200: 3 units CBG 201 - 250: 5 units CBG 251 - 300: 8 units CBG 301 - 350: 11 units CBG 351 - 400: 15 units CBG > 400: Patient taking differently: Inject 5 Units into the  skin 3 (three) times daily with meals. Hold if BS <150 or >400, notify provider 10/17/22   Burton Casey, MD  ipratropium-albuterol  (DUONEB) 0.5-2.5 (3) MG/3ML SOLN Take 3 mLs by nebulization every 6 (six) hours as needed. Patient taking differently: Take 3 mLs by nebulization every 6 (six) hours as needed (SOB/wheezing). 10/17/22   Ghimire, Estil Heman, MD  levothyroxine  (SYNTHROID ) 75 MCG tablet Take 75 mcg by mouth daily before breakfast.    [provider]  lubiprostone  (AMITIZA ) 8 MCG capsule Take 1 capsule (8 mcg total) by mouth 2 (two) times daily with a meal. 09/29/23   Delman Ferns, NP  metolazone  (ZAROXOLYN ) 2.5 MG tablet Take 1 tablet (2.5  mg total) by mouth once a week. On Wednesdays 10/31/23   Justina Oman, MD  omeprazole  (PRILOSEC) 20 MG capsule Take 20 mg by mouth daily. 01/19/24   [provider]  polyethylene glycol (MIRALAX  / GLYCOLAX ) 17 g packet Take 17 g by mouth daily as needed for moderate constipation.    [provider]  polyvinyl alcohol  (LIQUIFILM TEARS) 1.4 % ophthalmic solution Place 1 drop into both eyes daily. Patient not taking: Reported on 02/20/2024    [provider]  potassium chloride  (KLOR-CON ) 10 MEQ tablet Take 10 mEq by mouth daily.    [provider]  tamsulosin  (FLOMAX ) 0.4 MG CAPS capsule Take 0.8 mg by mouth every evening.    [provider]  thiamine  (VITAMIN B-1) 100 MG tablet Take 1 tablet (100 mg total) by mouth daily. 10/18/22   Ghimire, Estil Heman, MD  torsemide  (DEMADEX ) 20 MG tablet Take 2 tablets (40 mg total) by mouth 2 (two) times daily. 10/31/23   Justina Oman, MD  traZODone  (DESYREL ) 100 MG tablet Take 100 mg by mouth at bedtime. 01/19/24   [provider]  Vitamin D , Ergocalciferol , (DRISDOL ) 1.25 MG (50000 UNIT) CAPS capsule Take 50,000 Units by mouth every 7 (seven) days. (Mondays)    [provider]     Family History  Problem Relation Age of Onset   Hypertension  Mother     Social History   Socioeconomic History   Marital status: Single    Spouse name: Not on file   Number of children: Not on file   Years of education: Not on file   Highest education level: Not on file  Occupational History   Not on file  Tobacco Use   Smoking status: Former    Current packs/day: 0.00    Types: Cigarettes    Quit date: 05/25/1995    Years since quitting: 28.8   Smokeless tobacco: Never  Vaping Use   Vaping status: Never Used  Substance and Sexual Activity   Alcohol  use: Not Currently    Comment: rarely   Drug use: No   Sexual activity: Not Currently  Other Topics Concern   Not on file  Social History Narrative   Not on file   Social Drivers of Health   Financial Resource Strain: Not on file  Food Insecurity: No Food Insecurity (02/21/2024)   Hunger Vital Sign    Worried About Running Out of Food in the Last Year: Never true    Ran Out of Food in the Last Year: Never true  Transportation Needs: No Transportation Needs (02/21/2024)   PRAPARE - Administrator, Civil Service (Medical): No    Lack of Transportation (Non-Medical): No  Physical Activity: Not on file  Stress: Not on file  Social Connections: Unknown (02/21/2024)   Social Connection and Isolation Panel [NHANES]    Frequency of Communication with Friends and Family: Not on file    Frequency of Social Gatherings with Friends and Family: Not on file    Attends Religious Services: Not on file    Active Member of Clubs or Organizations: Not on file    Attends Banker Meetings: Not on file    Marital Status: Never married     Review of Systems: A 12 point ROS discussed and pertinent positives are indicated in the HPI above.  All other systems are negative.  Review of Systems  Constitutional:  Positive for fatigue. Negative for chills and fever.  Respiratory:  Positive  for shortness of breath.        Baseline  Cardiovascular:  Negative for chest pain.   Gastrointestinal:  Negative for abdominal distention, abdominal pain, blood in stool, nausea and vomiting.  Genitourinary:  Negative for hematuria.  Musculoskeletal:  Positive for back pain.  Hematological:  Does not bruise/bleed easily.    Vital Signs: BP 139/62   Pulse (!) 57   Temp 98 F (36.7 C) (Oral)   Resp 17   Ht 5\' 9"  (1.753 m)   Wt 235 lb (106.6 kg)   SpO2 96%   BMI 34.70 kg/m     Physical Exam HENT:     Mouth/Throat:     Mouth: Mucous membranes are moist.     Pharynx: Oropharynx is clear.  Cardiovascular:     Rate and Rhythm: Normal rate and regular rhythm.     Pulses: Normal pulses.     Heart sounds: Normal heart sounds.  Pulmonary:     Effort: Pulmonary effort is normal.     Breath sounds: Normal breath sounds.     Comments: Wearing  Abdominal:     Palpations: Abdomen is soft.     Tenderness: There is no abdominal tenderness.  Genitourinary:    Comments: Foley present and clamped Skin:    General: Skin is warm and dry.     Comments: Mild erythema/ dry skin at suprapubic skin fold, no open wounds  Neurological:     Mental Status: He is alert and oriented to person, place, and time.  Psychiatric:        Mood and Affect: Mood normal.        Behavior: Behavior normal.        Thought Content: Thought content normal.        Judgment: Judgment normal.     Imaging: US  EKG SITE RITE Result Date: 02/22/2024 If Site Rite image not attached, placement could not be confirmed due to current cardiac rhythm.  DG Elbow Complete Left Result Date: 02/20/2024 CLINICAL DATA:  Pain EXAM: LEFT ELBOW - COMPLETE 3+ VIEW COMPARISON:  None Available. FINDINGS: Mild spurring in the elbow joint. No acute bony abnormality. Specifically, no fracture, subluxation, or dislocation. Left elbow joint effusion. IMPRESSION: Degenerative changes with elbow joint effusion. No visible fracture. If there is clinical concern for occult fracture, consider immobilization and repeat  imaging in 1 week if symptoms persist. Electronically Signed   By: Janeece Mechanic M.D.   On: 02/20/2024 00:05    Labs:  CBC: Recent Labs    02/20/24 1530 02/21/24 0854 02/22/24 1014 02/23/24 0346  WBC 12.0* 9.9 9.8 10.8*  HGB 10.8* 9.7* 10.1* 9.3*  HCT 32.5* 30.5* 30.3* 27.7*  PLT 241 178 231 230    COAGS: No results for input(s): "INR", "APTT" in the last 8760 hours.  BMP: Recent Labs    02/20/24 1659 02/21/24 0854 02/22/24 1014 02/23/24 0346  NA 136 137 135 133*  K 3.6 4.0 4.4 4.0  CL 99 101 94* 92*  CO2 25 19* 25 28  GLUCOSE 122* 97 164* 164*  BUN 69* 76* 76* 77*  CALCIUM  7.6* 7.6* 8.3* 8.3*  CREATININE 2.44* 2.75* 2.40* 2.67*  GFRNONAA 29* 25* 30* 26*    LIVER FUNCTION TESTS: Recent Labs    12/10/23 1214 12/15/23 2054 02/20/24 1659 02/21/24 0854 02/22/24 1014 02/23/24 0346  BILITOT 0.7 0.8 1.0 0.8  --   --   AST 17 22 13* 14*  --   --   ALT 14  11 8 7   --   --   ALKPHOS 86 82 72 66  --   --   PROT 7.1 7.1 6.8 7.1  --   --   ALBUMIN  3.5 3.4* 3.3* 3.4* 3.5 3.1*    TUMOR MARKERS: No results for input(s): "AFPTM", "CEA", "CA199", "CHROMGRNA" in the last 8760 hours.  Assessment and Plan:  Request for image guided suprapubic catheter placement approved and scheduled 5/1 No contraindications for procedure identified in ROS, physical exam, or review of pre-sedation considerations. 4/15 Labs reviewed and within acceptable range. INR pending at time of note. VSS, afebrile  Risks and benefits of suprapubic catheter placement were discussed with the patient including bleeding, infection, damage to adjacent structures, bladder perforation/fistula connection, and sepsis.  All of the patient's questions were answered, patient is agreeable to proceed. Consent signed and in chart.   Thank you for allowing our service to participate in Shane Chambers 's care.    Electronically Signed: Terressa Fess, NP   03/11/2024, 9:23 AM     I spent a total of   30 Minutes   in face to face in clinical consultation, greater than 50% of which was counseling/coordinating care for image guided suprapubic catheter placement.    (A copy of this note was sent to the referring provider and the time of visit.)

## 2024-03-11 ENCOUNTER — Ambulatory Visit (HOSPITAL_COMMUNITY)
Admission: RE | Admit: 2024-03-11 | Discharge: 2024-03-11 | Disposition: A | Discharge status: Home / Self Care | Source: Ambulatory Visit | Attending: Urology | Admitting: Urology

## 2024-03-11 ENCOUNTER — Other Ambulatory Visit (HOSPITAL_COMMUNITY): Payer: Self-pay | Admitting: Urology

## 2024-03-11 ENCOUNTER — Encounter (HOSPITAL_COMMUNITY): Payer: Self-pay

## 2024-03-11 ENCOUNTER — Other Ambulatory Visit: Payer: Self-pay

## 2024-03-11 DIAGNOSIS — G4733 Obstructive sleep apnea (adult) (pediatric): Secondary | ICD-10-CM | POA: Insufficient documentation

## 2024-03-11 DIAGNOSIS — E785 Hyperlipidemia, unspecified: Secondary | ICD-10-CM | POA: Diagnosis not present

## 2024-03-11 DIAGNOSIS — N138 Other obstructive and reflux uropathy: Secondary | ICD-10-CM | POA: Diagnosis not present

## 2024-03-11 DIAGNOSIS — N401 Enlarged prostate with lower urinary tract symptoms: Secondary | ICD-10-CM | POA: Diagnosis not present

## 2024-03-11 DIAGNOSIS — R339 Retention of urine, unspecified: Secondary | ICD-10-CM | POA: Diagnosis present

## 2024-03-11 DIAGNOSIS — Z978 Presence of other specified devices: Secondary | ICD-10-CM | POA: Diagnosis present

## 2024-03-11 DIAGNOSIS — Z87442 Personal history of urinary calculi: Secondary | ICD-10-CM | POA: Insufficient documentation

## 2024-03-11 DIAGNOSIS — Z87891 Personal history of nicotine dependence: Secondary | ICD-10-CM | POA: Diagnosis not present

## 2024-03-11 DIAGNOSIS — Z794 Long term (current) use of insulin: Secondary | ICD-10-CM | POA: Insufficient documentation

## 2024-03-11 DIAGNOSIS — J449 Chronic obstructive pulmonary disease, unspecified: Secondary | ICD-10-CM | POA: Diagnosis not present

## 2024-03-11 DIAGNOSIS — N319 Neuromuscular dysfunction of bladder, unspecified: Secondary | ICD-10-CM | POA: Insufficient documentation

## 2024-03-11 DIAGNOSIS — I2510 Atherosclerotic heart disease of native coronary artery without angina pectoris: Secondary | ICD-10-CM | POA: Insufficient documentation

## 2024-03-11 DIAGNOSIS — E119 Type 2 diabetes mellitus without complications: Secondary | ICD-10-CM | POA: Insufficient documentation

## 2024-03-11 DIAGNOSIS — I10 Essential (primary) hypertension: Secondary | ICD-10-CM | POA: Insufficient documentation

## 2024-03-11 LAB — GLUCOSE, CAPILLARY
Glucose-Capillary: 96 mg/dL (ref 70–99)
Glucose-Capillary: 126 mg/dL — ABNORMAL HIGH (ref 70–99)

## 2024-03-11 LAB — PROTIME-INR
INR: 1.2 (ref 0.8–1.2)
Prothrombin Time: 15.2 s (ref 11.4–15.2)

## 2024-03-11 MED ORDER — LIDOCAINE VISCOUS HCL 2 % MT SOLN
OROMUCOSAL | Status: AC
  Filled 2024-03-11: qty 15

## 2024-03-11 MED ORDER — IOHEXOL 300 MG/ML  SOLN
50.0000 mL | Freq: Once | INTRAMUSCULAR | Status: AC | PRN
  Administered 2024-03-11: 20 mL

## 2024-03-11 MED ORDER — LIDOCAINE-EPINEPHRINE 1 %-1:100000 IJ SOLN
20.0000 mL | Freq: Once | INTRAMUSCULAR | Status: AC
  Administered 2024-03-11: 20 mL via INTRADERMAL

## 2024-03-11 MED ORDER — SODIUM CHLORIDE 0.9 % IV SOLN: INTRAVENOUS | Status: DC

## 2024-03-11 MED ORDER — MIDAZOLAM HCL 2 MG/2ML IJ SOLN
INTRAMUSCULAR | Status: AC | PRN
  Administered 2024-03-11 (×3): 1 mg via INTRAVENOUS

## 2024-03-11 MED ORDER — FENTANYL CITRATE (PF) 100 MCG/2ML IJ SOLN
INTRAMUSCULAR | Status: AC
  Filled 2024-03-11: qty 4

## 2024-03-11 MED ORDER — OXYCODONE HCL 5 MG PO TABS: 10.0000 mg | ORAL_TABLET | ORAL | Status: DC | PRN

## 2024-03-11 MED ORDER — LIDOCAINE-EPINEPHRINE 1 %-1:100000 IJ SOLN
INTRAMUSCULAR | Status: AC
Start: 2024-03-11 — End: ?
  Filled 2024-03-11: qty 1

## 2024-03-11 MED ORDER — MIDAZOLAM HCL 2 MG/2ML IJ SOLN
INTRAMUSCULAR | Status: AC
Start: 2024-03-11 — End: ?
  Filled 2024-03-11: qty 4

## 2024-03-11 MED ORDER — FENTANYL CITRATE (PF) 100 MCG/2ML IJ SOLN
INTRAMUSCULAR | Status: AC | PRN
  Administered 2024-03-11 (×3): 50 ug via INTRAVENOUS
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
## 2024-03-11 NOTE — Progress Notes (Signed)
 Social worker in to see client and ok to discharge home
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
## 2024-03-11 NOTE — Procedures (Signed)
 Vascular and Interventional Radiology Procedure Note  Patient: Shane Chambers DOB: Aug 09, 1961 Medical Record Number: [id number] Note Date/Time: 03/11/24 9:22 AM   Performing Physician: Art Largo, MD Assistant(s): None  Diagnosis: Urinary retention. Chronic foley catheter.  Procedure: SUPRAPUBIC CYSTOSTOMY DRAINAGE CATHETER PLACEMENT  Anesthesia: Conscious Sedation Complications: None Estimated Blood Loss: Minimal Specimens: Sent for None  Findings:  Successful Fluoroscopy-guided placement of 16 F Council tip suprapubic cystostomy drain catheter    Plan: Pt to follow up with Urology for routine SP catheter exchanges.  See detailed procedure note with images in PACS. The patient tolerated the procedure well without incident or complication and was returned to Recovery in stable condition.    Art Largo, MD [facility]   Pager. [phone] Clinic. [phone]

## 2024-03-15 ENCOUNTER — Telehealth: Payer: Self-pay

## 2024-03-15 ENCOUNTER — Ambulatory Visit: Admitting: Urology

## 2024-03-15 LAB — LAB REPORT - SCANNED: EGFR: 26

## 2024-03-15 NOTE — Telephone Encounter (Signed)
 Per facility manager continuously large amounts of blood coming out of his cath (SP tube) still urinating out of his penis needs to be seen by a provider

## 2024-03-15 NOTE — Progress Notes (Unsigned)
 Name: Shane Chambers DOB: 01-Jun-1961 MRN: [id number]  History of Present Illness: Mr. Brisbane is a 63 y.o. male who presents today for follow up visit at [facility] [place]. He resides at [facility] and [employer]. GU History includes: 1. BPH with BOO & LUTS.  - 02/14/2023: Prostate size ~24 ml per CT.  - Taking Flomax  (Tamsulosin ) 0.4 mg daily.  2. Chronic urinary retention.  3. Mild hypospadias. Per exam in ER on 04/16/2023. 4. Kidney stones.  - Has passed stones spontaneously without surgical intervention. - 07/30/2023: CT abdomen/pelvis w/ contrast showed no GU stones, masses, or hydronephrosis; prostate unremarkable.    At last visit on 02/16/2024: He elected to proceed with SP tube placement.   Since last visit: 03/11/2024: SP tube placed by Interventional Radiology.  Today: He reports that he had gross hematuria per his SP tube for a couple days after it was placed and then his urine cleared up some, however the SP tube is now barely draining any urine into the bag and he is having urine leakage per his urethra along with bladder pain.    Medications: Current Outpatient Medications  Medication Sig Dispense Refill   acetaminophen  (TYLENOL ) 500 MG tablet Take 1 tablet (500 mg total) by mouth every 8 (eight) hours as needed for fever or moderate pain (pain score 4-6).     albuterol  (VENTOLIN  HFA) 108 (90 Base) MCG/ACT inhaler Inhale 2 puffs into the lungs every 6 (six) hours as needed for wheezing or shortness of breath.     aspirin  81 MG EC tablet Take 1 tablet (81 mg total) by mouth daily with breakfast. 30 tablet 12   atorvastatin  (LIPITOR ) 40 MG tablet Take 1 tablet (40 mg total) by mouth at bedtime.     bisacodyl  5 MG EC tablet Take 1 tablet (5 mg total) by mouth daily as needed for moderate constipation. 30 tablet 0   carvedilol  (COREG ) 25 MG tablet Take 1 tablet (25 mg total) by mouth 2 (two) times daily with a meal.     cetirizine (ZYRTEC) 10  MG chewable tablet Chew 10 mg by mouth daily.     daptomycin  (CUBICIN ) IVPB Inject 700 mg into the vein daily. Indication:  Septic Joint  First Dose: Yes Last Day of Therapy:  04/01/24 Labs - Once weekly:  CBC/D, BMP, and CPK Labs - Once weekly: ESR and CRP Method of administration: IV Push Method of administration may be changed at the discretion of home infusion pharmacist based upon assessment of the patient and/or caregiver's ability to self-administer the medication ordered. 37 Units 0   ertapenem  (INVANZ ) IVPB Inject 1 g into the vein daily. Indication:  Septic joint First Dose: Yes Last Day of Therapy:  04/01/24 Labs - Once weekly:  CBC/D and BMP, Labs - Once weekly: ESR and CRP Method of administration: Mini-Bag Plus / Gravity Method of administration may be changed at the discretion of home infusion pharmacist based upon assessment of the patient and/or caregiver's ability to self-administer the medication ordered. 37 Units 0   ferrous gluconate  (FERGON) 324 MG tablet Take 324 mg by mouth daily.     fluticasone -salmeterol (ADVAIR) 500-50 MCG/ACT AEPB Inhale 1 puff into the lungs in the morning and at bedtime.     gabapentin  (NEURONTIN ) 300 MG capsule Take 300 mg by mouth 2 (two) times daily.     hydrALAZINE  (APRESOLINE ) 100 MG tablet Take 1 tablet (100 mg total) by mouth 2 (two) times daily.  hydrocortisone  cream 1 % Apply topically 3 (three) times daily. Apply to itching areas     hydrOXYzine  (ATARAX ) 25 MG tablet Take 1 tablet (25 mg total) by mouth 3 (three) times daily as needed for anxiety. (Patient taking differently: Take 25 mg by mouth every 8 (eight) hours as needed for anxiety.) 30 tablet 0   insulin  aspart (NOVOLOG  FLEXPEN) 100 UNIT/ML FlexPen 0-15 Units, Subcutaneous, 3 times daily with meals CBG < 70: implement hypoglycemia protocol-call MD CBG 70 - 120: 0 units CBG 121 - 150: 2 units CBG 151 - 200: 3 units CBG 201 - 250: 5 units CBG 251 - 300: 8 units CBG 301 -  350: 11 units CBG 351 - 400: 15 units CBG > 400: (Patient taking differently: Inject 5 Units into the skin 3 (three) times daily with meals. Hold if BS <150 or >400, notify provider) 15 mL 0   ipratropium-albuterol  (DUONEB) 0.5-2.5 (3) MG/3ML SOLN Take 3 mLs by nebulization every 6 (six) hours as needed. (Patient taking differently: Take 3 mLs by nebulization every 6 (six) hours as needed (SOB/wheezing).) 360 mL    levothyroxine  (SYNTHROID ) 75 MCG tablet Take 75 mcg by mouth daily before breakfast.     lubiprostone  (AMITIZA ) 8 MCG capsule Take 1 capsule (8 mcg total) by mouth 2 (two) times daily with a meal. 60 capsule 5   metolazone  (ZAROXOLYN ) 2.5 MG tablet Take 1 tablet (2.5 mg total) by mouth once a week. On Wednesdays     omeprazole  (PRILOSEC) 20 MG capsule Take 20 mg by mouth daily.     polyethylene glycol (MIRALAX  / GLYCOLAX ) 17 g packet Take 17 g by mouth daily as needed for moderate constipation.     polyvinyl alcohol  (LIQUIFILM TEARS) 1.4 % ophthalmic solution Place 1 drop into both eyes daily.     potassium chloride  (KLOR-CON ) 10 MEQ tablet Take 10 mEq by mouth daily.     tamsulosin  (FLOMAX ) 0.4 MG CAPS capsule Take 0.8 mg by mouth every evening.     thiamine  (VITAMIN B-1) 100 MG tablet Take 1 tablet (100 mg total) by mouth daily.     torsemide  (DEMADEX ) 20 MG tablet Take 2 tablets (40 mg total) by mouth 2 (two) times daily.     traZODone  (DESYREL ) 100 MG tablet Take 100 mg by mouth at bedtime.     Vitamin D , Ergocalciferol , (DRISDOL ) 1.25 MG (50000 UNIT) CAPS capsule Take 50,000 Units by mouth every 7 (seven) days. (Mondays)     No current facility-administered medications for this visit.    Allergies: Allergies  Allergen Reactions   Ceftriaxone  Anaphylaxis    Previously tolerated at [facility] during multiple admissions. Patient reports SOB reaction in [place], Summer 2024. Now reporting SOB reaction at [facility] 02/21/24. No swelling, or skin reaction. Satted well on baseline  oxygen. Does not wish to receive Ceftriaxone  in the future.   Ace Inhibitors Swelling and Cough   Chlorhexidine     Haloperidol  And Related     Do NOT give anti-psychotics due to risk of  Torsades and QT prolongation (per Dr. Daphane Dynes request)   Other Itching    [name]   Pork Allergy Other (See Comments)    Not listed in [facility]   Shellfish Allergy     Listed on MAR   Penicillins Itching and Rash    Tolerated ancef  (12-17-22)    Past Medical History:  Diagnosis Date   Acute on chronic combined systolic and diastolic CHF (congestive heart failure) (HCC) 03/05/2020  Acute on chronic heart failure with preserved ejection fraction (HFpEF) (HCC) 03/27/2023   Anemia    Arthritis    CAD (coronary artery disease)    a. s/p CABG in 03/2020 with LIMA-LAD, RIMA-PL, RA-D1-RI-OM1   Cancer (HCC)    rectal   Cellulitis    COPD (chronic obstructive pulmonary disease) (HCC)    Depression    Diabetes mellitus without complication (HCC)    GERD (gastroesophageal reflux disease)    History of kidney stones    Hyperlipidemia 12/01/2019   Hypertension    Hypothyroidism    Ischemic cardiomyopathy    a. EF 20-25% by echo in 02/2020 b. at 40% by echo in 03/2020 c. EF normalized to 60-65% by echo in 04/2020   Myocardial infarction [facility])    Peripheral vascular disease (HCC)    Sleep apnea    Type 2 diabetes mellitus (HCC)    Past Surgical History:  Procedure Laterality Date   AMPUTATION Right 10/05/2022   Procedure: AMPUTATION BELOW KNEE;  Surgeon: Timothy Ford, MD;  Location: [facility] OR;  Service: Orthopedics;  Laterality: Right;   AMPUTATION Right 11/14/2022   Procedure: AMPUTATION BELOW KNEE REVISION; WASHOUT;  Surgeon: Jackquelyn Mass, MD;  Location: [facility] ORS;  Service: Vascular;  Laterality: Right;   AMPUTATION Right 11/18/2022   Procedure: AMPUTATION BELOW KNEE REVISION AND CLOSURE;  Surgeon: Jackquelyn Mass, MD;  Location: [facility] ORS;  Service: Vascular;  Laterality: Right;   APPLICATION  OF WOUND VAC Right 10/05/2022   Procedure: APPLICATION OF WOUND VAC;  Surgeon: Timothy Ford, MD;  Location: [facility] OR;  Service: Orthopedics;  Laterality: Right;   BIOPSY  07/18/2021   Procedure: BIOPSY;  Surgeon: Vinetta Greening, DO;  Location: [facility];  Service: Endoscopy;;   BIOPSY  01/09/2022   Procedure: BIOPSY;  Surgeon: Vinetta Greening, DO;  [facility]: [facility];  Service: Endoscopy;;   BIOPSY  06/29/2023   Procedure: BIOPSY;  Surgeon: Vinetta Greening, DO;  [facility];  Service: Endoscopy;;   COLONOSCOPY WITH PROPOFOL  N/A 07/18/2021   Carver: 15 millimeter polyp removed from the sigmoid colon, 5 mm polyp removed from sigmoid colon.  Nonbleeding internal hemorrhoids.  Significant looping of the colon. sigmoid path showed invasive colonic adenocarcinoma involving tubular adenoma (invades to depth of 2mm, carcinoma 1mm from margin, no lymphovascular invasion, no poorly differentiated component.   COLONOSCOPY WITH PROPOFOL  N/A 06/29/2023   Procedure: COLONOSCOPY WITH PROPOFOL ;  Surgeon: Vinetta Greening, DO;  [facility];  Service: Endoscopy;  Laterality: N/A;  7:30 am, asa 3   CORONARY ARTERY BYPASS GRAFT N/A 03/13/2020   Procedure: CORONARY ARTERY BYPASS GRAFTING (CABG) times five using bilateral Internal mammary arteries and left radial artery;  Surgeon: Rudine Cos, MD;  Location: [facility] OR;  Service: Open Heart Surgery;  Laterality: N/A;   DENTAL SURGERY     ESOPHAGOGASTRODUODENOSCOPY (EGD) WITH PROPOFOL  N/A 07/18/2021   Carver: 1 gastric polyp status post biopsy, gastritis. gastric bx with slight chronic inflammation and no H.pyori. GEJ polypectomy with mild inflammation only   FLEXIBLE SIGMOIDOSCOPY N/A 08/26/2021   Carver: Nonbleeding internal hemorrhoids.  15 mm ulcers from previous polypectomy found in the rectum.  No evidence of previous polyp.  Located 5 to 8 cm from anal verge.   FLEXIBLE SIGMOIDOSCOPY N/A 01/09/2022   Procedure: FLEXIBLE  SIGMOIDOSCOPY;  Surgeon: Vinetta Greening, DO;  [facility];  Service: Endoscopy;  Laterality: N/A;   IR CYSTOSTOMY TUBE  PLACEMENT/BLADDER ASPIRATION  03/11/2024   IRRIGATION AND DEBRIDEMENT ELBOW Left 02/20/2024   Procedure: LEFT ELBOW IRRIGATION AND DEBRIDEMENT;  Surgeon: Marilee Showers, MD;  Location: [facility] OR;  Service: Orthopedics;  Laterality: Left;   POLYPECTOMY  07/18/2021   Procedure: POLYPECTOMY INTESTINAL;  Surgeon: Vinetta Greening, DO;  [facility];  Service: Endoscopy;;   POLYPECTOMY  06/29/2023   Procedure: POLYPECTOMY INTESTINAL;  Surgeon: Vinetta Greening, DO;  [facility];  Service: Endoscopy;;   RADIAL ARTERY HARVEST Left 03/13/2020   Procedure: RADIAL ARTERY HARVEST,;  Surgeon: Rudine Cos, MD;  Location: [facility] OR;  Service: Open Heart Surgery;  Laterality: Left;   RIGHT/LEFT HEART CATH AND CORONARY ANGIOGRAPHY N/A 03/07/2020   Procedure: RIGHT/LEFT HEART CATH AND CORONARY ANGIOGRAPHY;  Surgeon: Arnoldo Lapping, MD;  Location: [facility] INVASIVE CV LAB;  Service: Cardiovascular;  Laterality: N/A;   STUMP REVISION Right 12/17/2022   Procedure: RIGHT BELOW KNEE AMPUTATION REVISION;  Surgeon: Timothy Ford, MD;  Location: [facility] OR;  Service: Orthopedics;  Laterality: Right;   SUBMUCOSAL LIFTING INJECTION  01/09/2022   Procedure: SUBMUCOSAL LIFTING INJECTION;  Surgeon: Vinetta Greening, DO;  [facility];  Service: Endoscopy;;   SUBMUCOSAL TATTOO INJECTION  01/09/2022   Procedure: SUBMUCOSAL TATTOO INJECTION;  Surgeon: Vinetta Greening, DO;  [facility];  Service: Endoscopy;;   TEE WITHOUT CARDIOVERSION N/A 03/13/2020   Procedure: TRANSESOPHAGEAL ECHOCARDIOGRAM (TEE);  Surgeon: Rudine Cos, MD;  Location: [facility] OR;  Service: Open Heart Surgery;  Laterality: N/A;   Family History  Problem Relation Age of Onset   Hypertension Mother    Social History   Socioeconomic History   Marital status: Single    Spouse name: Not on file    Number of children: Not on file   Years of education: Not on file   Highest education level: Not on file  Occupational History   Not on file  Tobacco Use   Smoking status: Former    Current packs/day: 0.00    Types: Cigarettes    Quit date: 05/25/1995    Years since quitting: 28.8   Smokeless tobacco: Never  Vaping Use   Vaping status: Never Used  Substance and Sexual Activity   Alcohol  use: Not Currently    Comment: rarely   Drug use: No   Sexual activity: Not Currently  Other Topics Concern   Not on file  Social History Narrative   Not on file   Social Drivers of Health   Financial Resource Strain: Not on file  Food Insecurity: No Food Insecurity (02/21/2024)   Hunger Vital Sign    Worried About Running Out of Food in the Last Year: Never true    Ran Out of Food in the Last Year: Never true  Transportation Needs: No Transportation Needs (02/21/2024)   PRAPARE - Administrator, Civil Service (Medical): No    Lack of Transportation (Non-Medical): No  Physical Activity: Not on file  Stress: Not on file  Social Connections: Unknown (02/21/2024)   Social Connection and Isolation Panel [NHANES]    Frequency of Communication with Friends and Family: Not on file    Frequency of Social Gatherings with Friends and Family: Not on file    Attends Religious Services: Not on file    Active Member of Clubs or Organizations: Not on file    Attends Banker Meetings: Not on file    Marital Status: Never married  Intimate Partner  Violence: Not At Risk (02/21/2024)   Humiliation, Afraid, Rape, and Kick questionnaire    Fear of Current or Ex-Partner: No    Emotionally Abused: No    Physically Abused: No    Sexually Abused: No    Review of Systems Constitutional: Patient denies any unintentional weight loss or change in strength lntegumentary: Patient denies any rashes or pruritus Cardiovascular: Patient denies chest pain or syncope Respiratory: Patient  denies shortness of breath Musculoskeletal: Patient reports inability to stand & pivot  Neurologic: Patient denies convulsions or seizures Allergic/Immunologic: Patient denies recent allergic reaction(s) Endocrine: Patient denies heat/cold intolerance  GU: As per HPI.  OBJECTIVE Vitals:   03/16/24 0909  BP: (!) 150/83  Pulse: (!) 58   There is no height or weight on file to calculate BMI.  Physical Examination Constitutional: Appears uncomfortable  Cardiovascular: No visible lower extremity edema.  Respiratory: The patient does not have audible wheezing/stridor; respirations do not appear labored  Gastrointestinal: Abdomen non-distended Musculoskeletal: Normal ROM of UEs  Skin: No obvious rashes/open sores  Neurologic: CN 2-12 grossly intact Psychiatric: Answered questions appropriately with normal affect   GU: Unable to assess SP tube insertion site due to his positioning / body habitus / inability to stand & pivot from wheelchair onto exam table.   ASSESSMENT Neurogenic bladder  Chronic retention of urine  Suprapubic catheter (HCC)  Patient was advised that his SP tube is likely clogged due to clot material or could be malpositioned; urine leakage per his urethra is most likely due to overflow incontinence. Unable to assess SP tube insertion site, attempt to irrigate / flush his SP tube, or attempt indwelling Foley catheter placement due to patient's ambulatory dysfunction with inability to stand & pivot from wheelchair onto exam table. He was therefore advised to proceed via EMS transport to the ER where Interventional Radiology can evaluate and manage his SP tube to empty his bladder. Interventional Radiology notified by S. Fulton Job, CMA. Patient verbalized understanding of and agreement with current plan. All questions were answered.  PLAN Advised the following: 1. To ER today.  No orders of the defined types were placed in this encounter.  It has been explained that the  patient is to follow regularly with their PCP in addition to all other providers involved in their care and to follow instructions provided by these respective offices. Patient advised to contact urology clinic if any urologic-pertaining questions, concerns, new symptoms or problems arise in the interim period.  There are no Patient Instructions on file for this visit.  Electronically signed by:  Lauretta Ponto, FNP   03/16/24    11:08 AM

## 2024-03-16 ENCOUNTER — Encounter (HOSPITAL_COMMUNITY): Payer: Self-pay

## 2024-03-16 ENCOUNTER — Encounter: Payer: Self-pay | Admitting: Nurse Practitioner

## 2024-03-16 ENCOUNTER — Ambulatory Visit: Admitting: Urology

## 2024-03-16 ENCOUNTER — Other Ambulatory Visit: Payer: Self-pay

## 2024-03-16 ENCOUNTER — Emergency Department (HOSPITAL_COMMUNITY)
Admission: EM | Admit: 2024-03-16 | Discharge: 2024-03-16 | Disposition: A | Discharge status: Home / Self Care | Attending: Emergency Medicine | Admitting: Emergency Medicine

## 2024-03-16 ENCOUNTER — Encounter: Payer: Self-pay | Admitting: Urology

## 2024-03-16 ENCOUNTER — Telehealth: Payer: Self-pay

## 2024-03-16 VITALS — BP 150/83 | HR 58

## 2024-03-16 DIAGNOSIS — Z7982 Long term (current) use of aspirin: Secondary | ICD-10-CM | POA: Diagnosis not present

## 2024-03-16 DIAGNOSIS — Z9359 Other cystostomy status: Secondary | ICD-10-CM

## 2024-03-16 DIAGNOSIS — R339 Retention of urine, unspecified: Secondary | ICD-10-CM

## 2024-03-16 DIAGNOSIS — T83098A Other mechanical complication of other indwelling urethral catheter, initial encounter: Secondary | ICD-10-CM | POA: Diagnosis present

## 2024-03-16 DIAGNOSIS — N319 Neuromuscular dysfunction of bladder, unspecified: Secondary | ICD-10-CM

## 2024-03-16 DIAGNOSIS — Y829 Unspecified medical devices associated with adverse incidents: Secondary | ICD-10-CM | POA: Diagnosis not present

## 2024-03-16 NOTE — Consult Note (Signed)
 Chief Complaint: Sent from urology office for concerns with patency of SP tube placed 03/11/24 by Dr. Darylene Epley  Referring Provider(s): Lauretta Ponto  Supervising Physician: Myrlene Asper  Patient Status: [facility] - ED  History of Present Illness: Shane Chambers is a 63 y.o. male with PMH significant for CAD (CABG 2021), rectal CA, COPD, DM, OSA, HTN, HLD, BPH with symptomatic bladder outlet obstruction, kidney stones. He is a resident at [employer]. He had required foley for urinary retention since 02/2023 and has failed voiding trial since then per urology note leading to referral to IR for SP tube.  Dr. Darylene Epley placed SP tube on 5/2 with 16Fr Councill tip.  He presents now to the ER from urology office for concern of hematuria and SP cath occlusion. Pt endorses chronic suprapubic tenderness. States he experienced blood in urine over the weekend but that this has since cleared. He is concerned about voiding spontaneously from his urethra in addition to output from SP tube. Denies fever, chills. He is A&Ox4, lying in bed.   Patient is Full Code  Past Medical History:  Diagnosis Date   Acute on chronic combined systolic and diastolic CHF (congestive heart failure) (HCC) 03/05/2020   Acute on chronic heart failure with preserved ejection fraction (HFpEF) (HCC) 03/27/2023   Anemia    Arthritis    CAD (coronary artery disease)    a. s/p CABG in 03/2020 with LIMA-LAD, RIMA-PL, RA-D1-RI-OM1   Cancer (HCC)    rectal   Cellulitis    COPD (chronic obstructive pulmonary disease) (HCC)    Depression    Diabetes mellitus without complication (HCC)    GERD (gastroesophageal reflux disease)    History of kidney stones    Hyperlipidemia 12/01/2019   Hypertension    Hypothyroidism    Ischemic cardiomyopathy    a. EF 20-25% by echo in 02/2020 b. at 40% by echo in 03/2020 c. EF normalized to 60-65% by echo in 04/2020   Myocardial infarction [facility])    Peripheral vascular disease (HCC)     Sleep apnea    Type 2 diabetes mellitus (HCC)     Past Surgical History:  Procedure Laterality Date   AMPUTATION Right 10/05/2022   Procedure: AMPUTATION BELOW KNEE;  Surgeon: Timothy Ford, MD;  Location: [facility] OR;  Service: Orthopedics;  Laterality: Right;   AMPUTATION Right 11/14/2022   Procedure: AMPUTATION BELOW KNEE REVISION; WASHOUT;  Surgeon: Jackquelyn Mass, MD;  Location: [facility] ORS;  Service: Vascular;  Laterality: Right;   AMPUTATION Right 11/18/2022   Procedure: AMPUTATION BELOW KNEE REVISION AND CLOSURE;  Surgeon: Jackquelyn Mass, MD;  Location: [facility] ORS;  Service: Vascular;  Laterality: Right;   APPLICATION OF WOUND VAC Right 10/05/2022   Procedure: APPLICATION OF WOUND VAC;  Surgeon: Timothy Ford, MD;  Location: [facility] OR;  Service: Orthopedics;  Laterality: Right;   BIOPSY  07/18/2021   Procedure: BIOPSY;  Surgeon: Vinetta Greening, DO;  [facility];  Service: Endoscopy;;   BIOPSY  01/09/2022   Procedure: BIOPSY;  Surgeon: Vinetta Greening, DO;  [facility];  Service: Endoscopy;;   BIOPSY  06/29/2023   Procedure: BIOPSY;  Surgeon: Vinetta Greening, DO;  [facility];  Service: Endoscopy;;   COLONOSCOPY WITH PROPOFOL  N/A 07/18/2021   Carver: 15 millimeter polyp removed from the sigmoid colon, 5 mm polyp removed from sigmoid colon.  Nonbleeding internal hemorrhoids.  Significant looping of the colon. sigmoid path showed invasive  colonic adenocarcinoma involving tubular adenoma (invades to depth of 2mm, carcinoma 1mm from margin, no lymphovascular invasion, no poorly differentiated component.   COLONOSCOPY WITH PROPOFOL  N/A 06/29/2023   Procedure: COLONOSCOPY WITH PROPOFOL ;  Surgeon: Vinetta Greening, DO;  [facility];  Service: Endoscopy;  Laterality: N/A;  7:30 am, asa 3   CORONARY ARTERY BYPASS GRAFT N/A 03/13/2020   Procedure: CORONARY ARTERY BYPASS GRAFTING (CABG) times five using bilateral Internal mammary arteries and left  radial artery;  Surgeon: Rudine Cos, MD;  Location: [facility] OR;  Service: Open Heart Surgery;  Laterality: N/A;   DENTAL SURGERY     ESOPHAGOGASTRODUODENOSCOPY (EGD) WITH PROPOFOL  N/A 07/18/2021   Carver: 1 gastric polyp status post biopsy, gastritis. gastric bx with slight chronic inflammation and no H.pyori. GEJ polypectomy with mild inflammation only   FLEXIBLE SIGMOIDOSCOPY N/A 08/26/2021   Carver: Nonbleeding internal hemorrhoids.  15 mm ulcers from previous polypectomy found in the rectum.  No evidence of previous polyp.  Located 5 to 8 cm from anal verge.   FLEXIBLE SIGMOIDOSCOPY N/A 01/09/2022   Procedure: FLEXIBLE SIGMOIDOSCOPY;  Surgeon: Vinetta Greening, DO;  [facility];  Service: Endoscopy;  Laterality: N/A;   IR CYSTOSTOMY TUBE PLACEMENT/BLADDER ASPIRATION  03/11/2024   IRRIGATION AND DEBRIDEMENT ELBOW Left 02/20/2024   Procedure: LEFT ELBOW IRRIGATION AND DEBRIDEMENT;  Surgeon: Marilee Showers, MD;  Location: [facility] OR;  Service: Orthopedics;  Laterality: Left;   POLYPECTOMY  07/18/2021   Procedure: POLYPECTOMY INTESTINAL;  Surgeon: Vinetta Greening, DO;  [facility];  Service: Endoscopy;;   POLYPECTOMY  06/29/2023   Procedure: POLYPECTOMY INTESTINAL;  Surgeon: Vinetta Greening, DO;  [facility];  Service: Endoscopy;;   RADIAL ARTERY HARVEST Left 03/13/2020   Procedure: RADIAL ARTERY HARVEST,;  Surgeon: Rudine Cos, MD;  Location: [facility] OR;  Service: Open Heart Surgery;  Laterality: Left;   RIGHT/LEFT HEART CATH AND CORONARY ANGIOGRAPHY N/A 03/07/2020   Procedure: RIGHT/LEFT HEART CATH AND CORONARY ANGIOGRAPHY;  Surgeon: Arnoldo Lapping, MD;  Location: [facility] INVASIVE CV LAB;  Service: Cardiovascular;  Laterality: N/A;   STUMP REVISION Right 12/17/2022   Procedure: RIGHT BELOW KNEE AMPUTATION REVISION;  Surgeon: Timothy Ford, MD;  Location: [facility] OR;  Service: Orthopedics;  Laterality: Right;   SUBMUCOSAL LIFTING INJECTION  01/09/2022   Procedure:  SUBMUCOSAL LIFTING INJECTION;  Surgeon: Vinetta Greening, DO;  [facility];  Service: Endoscopy;;   SUBMUCOSAL TATTOO INJECTION  01/09/2022   Procedure: SUBMUCOSAL TATTOO INJECTION;  Surgeon: Vinetta Greening, DO;  [facility];  Service: Endoscopy;;   TEE WITHOUT CARDIOVERSION N/A 03/13/2020   Procedure: TRANSESOPHAGEAL ECHOCARDIOGRAM (TEE);  Surgeon: Rudine Cos, MD;  Location[facility] OR;  Service: Open Heart Surgery;  Laterality: N/A;    Allergies: Ceftriaxone , Ace inhibitors, Chlorhexidine , Haloperidol  and related, Other, Pork allergy, Shellfish allergy, and Penicillins  Medications: Prior to Admission medications   Medication Sig Start Date End Date Taking? Authorizing Provider  acetaminophen  (TYLENOL ) 500 MG tablet Take 1 tablet (500 mg total) by mouth every 8 (eight) hours as needed for fever or moderate pain (pain score 4-6). 02/25/24   Pokhrel, Laxman, MD  albuterol  (VENTOLIN  HFA) 108 (90 Base) MCG/ACT inhaler Inhale 2 puffs into the lungs every 6 (six) hours as needed for wheezing or shortness of breath. 05/02/22   Rayfield Cairo, MD  aspirin  81 MG EC tablet Take 1 tablet (81 mg total) by mouth daily with breakfast. 11/29/20  Colin Dawley, MD  atorvastatin  (LIPITOR ) 40 MG tablet Take 1 tablet (40 mg total) by mouth at bedtime. 11/28/22   Althia Atlas, MD  bisacodyl  5 MG EC tablet Take 1 tablet (5 mg total) by mouth daily as needed for moderate constipation. 05/28/23   Vinetta Greening, DO  carvedilol  (COREG ) 25 MG tablet Take 1 tablet (25 mg total) by mouth 2 (two) times daily with a meal. 04/02/23   Ozell Blunt, MD  cetirizine (ZYRTEC) 10 MG chewable tablet Chew 10 mg by mouth daily.    [provider]  daptomycin  (CUBICIN ) IVPB Inject 700 mg into the vein daily. Indication:  Septic Joint  First Dose: Yes Last Day of Therapy:  04/01/24 Labs - Once weekly:  CBC/D, BMP, and CPK Labs - Once weekly: ESR and CRP Method of administration: IV  Push Method of administration may be changed at the discretion of home infusion pharmacist based upon assessment of the patient and/or caregiver's ability to self-administer the medication ordered. 02/25/24 04/02/24  Pokhrel, Amador Bad, MD  ertapenem  (INVANZ ) IVPB Inject 1 g into the vein daily. Indication:  Septic joint First Dose: Yes Last Day of Therapy:  04/01/24 Labs - Once weekly:  CBC/D and BMP, Labs - Once weekly: ESR and CRP Method of administration: Mini-Bag Plus / Gravity Method of administration may be changed at the discretion of home infusion pharmacist based upon assessment of the patient and/or caregiver's ability to self-administer the medication ordered. 02/25/24 04/02/24  Pokhrel, Amador Bad, MD  ferrous gluconate  (FERGON) 324 MG tablet Take 324 mg by mouth daily.    [provider]  fluticasone -salmeterol (ADVAIR) 500-50 MCG/ACT AEPB Inhale 1 puff into the lungs in the morning and at bedtime.    [provider]  gabapentin  (NEURONTIN ) 300 MG capsule Take 300 mg by mouth 2 (two) times daily. 10/13/23   [provider]  hydrALAZINE  (APRESOLINE ) 100 MG tablet Take 1 tablet (100 mg total) by mouth 2 (two) times daily. 10/31/23   Justina Oman, MD  hydrocortisone  cream 1 % Apply topically 3 (three) times daily. Apply to itching areas 02/25/24   Pokhrel, Laxman, MD  hydrOXYzine  (ATARAX ) 25 MG tablet Take 1 tablet (25 mg total) by mouth 3 (three) times daily as needed for anxiety. Patient taking differently: Take 25 mg by mouth every 8 (eight) hours as needed for anxiety. 10/31/23   Justina Oman, MD  insulin  aspart (NOVOLOG  FLEXPEN) 100 UNIT/ML FlexPen 0-15 Units, Subcutaneous, 3 times daily with meals CBG < 70: implement hypoglycemia protocol-call MD CBG 70 - 120: 0 units CBG 121 - 150: 2 units CBG 151 - 200: 3 units CBG 201 - 250: 5 units CBG 251 - 300: 8 units CBG 301 - 350: 11 units CBG 351 - 400: 15 units CBG > 400: Patient taking differently: Inject 5  Units into the skin 3 (three) times daily with meals. Hold if BS <150 or >400, notify provider 10/17/22   Burton Casey, MD  ipratropium-albuterol  (DUONEB) 0.5-2.5 (3) MG/3ML SOLN Take 3 mLs by nebulization every 6 (six) hours as needed. Patient taking differently: Take 3 mLs by nebulization every 6 (six) hours as needed (SOB/wheezing). 10/17/22   Ghimire, Estil Heman, MD  levothyroxine  (SYNTHROID ) 75 MCG tablet Take 75 mcg by mouth daily before breakfast.    [provider]  lubiprostone  (AMITIZA ) 8 MCG capsule Take 1 capsule (8 mcg total) by mouth 2 (two) times daily with a meal. 09/29/23   Delman Ferns, NP  metolazone  (  ZAROXOLYN ) 2.5 MG tablet Take 1 tablet (2.5 mg total) by mouth once a week. On Wednesdays 10/31/23   Justina Oman, MD  omeprazole  (PRILOSEC) 20 MG capsule Take 20 mg by mouth daily. 01/19/24   [provider]  polyethylene glycol (MIRALAX  / GLYCOLAX ) 17 g packet Take 17 g by mouth daily as needed for moderate constipation.    [provider]  polyvinyl alcohol  (LIQUIFILM TEARS) 1.4 % ophthalmic solution Place 1 drop into both eyes daily.    [provider]  potassium chloride  (KLOR-CON ) 10 MEQ tablet Take 10 mEq by mouth daily.    [provider]  tamsulosin  (FLOMAX ) 0.4 MG CAPS capsule Take 0.8 mg by mouth every evening.    [provider]  thiamine  (VITAMIN B-1) 100 MG tablet Take 1 tablet (100 mg total) by mouth daily. 10/18/22   Ghimire, Estil Heman, MD  torsemide  (DEMADEX ) 20 MG tablet Take 2 tablets (40 mg total) by mouth 2 (two) times daily. 10/31/23   Justina Oman, MD  traZODone  (DESYREL ) 100 MG tablet Take 100 mg by mouth at bedtime. 01/19/24   [provider]  Vitamin D , Ergocalciferol , (DRISDOL ) 1.25 MG (50000 UNIT) CAPS capsule Take 50,000 Units by mouth every 7 (seven) days. (Mondays)    [provider]     Family History  Problem Relation Age of Onset   Hypertension Mother     Social History    Socioeconomic History   Marital status: Single    Spouse name: Not on file   Number of children: Not on file   Years of education: Not on file   Highest education level: Not on file  Occupational History   Not on file  Tobacco Use   Smoking status: Former    Current packs/day: 0.00    Types: Cigarettes    Quit date: 05/25/1995    Years since quitting: 28.8   Smokeless tobacco: Never  Vaping Use   Vaping status: Never Used  Substance and Sexual Activity   Alcohol  use: Not Currently    Comment: rarely   Drug use: No   Sexual activity: Not Currently  Other Topics Concern   Not on file  Social History Narrative   Not on file   Social Drivers of Health   Financial Resource Strain: Not on file  Food Insecurity: No Food Insecurity (02/21/2024)   Hunger Vital Sign    Worried About Running Out of Food in the Last Year: Never true    Ran Out of Food in the Last Year: Never true  Transportation Needs: No Transportation Needs (02/21/2024)   PRAPARE - Administrator, Civil Service (Medical): No    Lack of Transportation (Non-Medical): No  Physical Activity: Not on file  Stress: Not on file  Social Connections: Unknown (02/21/2024)   Social Connection and Isolation Panel [NHANES]    Frequency of Communication with Friends and Family: Not on file    Frequency of Social Gatherings with Friends and Family: Not on file    Attends Religious Services: Not on file    Active Member of Clubs or Organizations: Not on file    Attends Banker Meetings: Not on file    Marital Status: Never married     Review of Systems: A 12 point ROS discussed and pertinent positives are indicated in the HPI above.  All other systems are negative.  Review of Systems  Constitutional:  Negative for chills and fever.  Gastrointestinal:  Positive for  abdominal pain. Negative for abdominal distention.  Genitourinary:  Negative for decreased urine volume, hematuria and penile pain.   Neurological:  Negative for weakness.    Vital Signs: BP (!) 123/57   Pulse (!) 54   Temp 98.4 F (36.9 C)   Resp 18   Ht 5\' 9"  (1.753 m)   Wt 235 lb (106.6 kg)   SpO2 100%   BMI 34.70 kg/m     Physical Exam Abdominal:     General: There is no distension.     Palpations: Abdomen is soft.     Tenderness: There is abdominal tenderness.  Genitourinary:    Comments: Sp tube present and intact. Stat lock not in place. Dressing C/D/I. Yellow urine with sediment noted in collection bag. Gentle manual traction suggests balloon remains inflated.  Skin:    General: Skin is warm and dry.  Neurological:     Mental Status: He is oriented to person, place, and time.  Psychiatric:        Mood and Affect: Mood normal.        Behavior: Behavior normal.     Imaging: IR CYSTOSTOMY TUBE PLACEMENT/BLADDER ASPIRATION Result Date: 03/11/2024 INDICATION: urine retention.  Foley catheter dependent. EXAM: ULTRASOUND AND FLUOROSCOPIC GUIDED SUPRAPUBIC CATHETER PLACEMENT COMPARISON:  [facility], 08/03/2023.  CT [facility], 07/30/2023. MEDICATIONS: None ANESTHESIA/SEDATION: Moderate (conscious) sedation was employed during this procedure. A total of Versed  3 mg and Fentanyl  150 mcg was administered intravenously. Moderate Sedation Time: 26 minutes. The patient's level of consciousness and vital signs were monitored continuously by radiology nursing throughout the procedure under my direct supervision. CONTRAST:  20 mL Omnipaque  300 FLUOROSCOPY TIME:  Fluoroscopic dose; 57 mGy COMPLICATIONS: None immediate. PROCEDURE: The procedure, risks, benefits, and alternatives were explained to the patient. Questions regarding the procedure were encouraged and answered. The patient understands and consents to the procedure. A timeout was performed prior to the initiation of the procedure. The patient was positioned supine. Preprocedural imaging was obtained of the lower pelvis. The skin overlying the anterior aspect the lower pelvis  was prepped and draped in usual sterile fashion. Under direct ultrasound guidance, a 22 gauge needle was utilized for the purposes of procedural planning after the overlying soft tissues were anesthetized with 1% lidocaine . Next, an 18 gauge trocar needle was advanced into the urinary bladder under direct ultrasound guidance. Ultrasound image was saved for procedural documentation purposes. A short Amplatz wire was coiled within the urinary bladder. Appropriate position was confirmed with a fluoroscopy. Under direct fluoroscopic guidance, a 8 x 80 mm balloon was advanced over the wire and insufflated to effacement. The balloon was released, allowing placement of a 16 Fr Council-tip suprapubic cystostomy drainage catheter with end locked within the urinary bladder. The catheter was connected to a gravity bag. The exit site of the catheter was secured within interrupted suture. A dressing was placed. The patient tolerated the procedure well without immediate postprocedural complication. FINDINGS: There is no bowel interposed between the anterior aspect of the urinary bladder and the ventral lower abdominal/pelvic wall. Ultrasound and fluoroscopy was utilized for the placement of a 16 Fr Council-tip percutaneous drai suprapubic cystostomy catheter with end coiled and locked within the urinary bladder. IMPRESSION: Successful ultrasound and fluoroscopic-guided placement of a 16 Fr Council-tip suprapubic catheter. RECOMMENDATIONS: Future routine catheter exchanges per urology service. Art Largo, MD [facility] Electronically Signed   By: Art Largo M.D.   On: 03/11/2024 15:32   US  EKG SITE RITE  Result Date: 02/22/2024 If Site Rite image not attached, placement could not be confirmed due to current cardiac rhythm.  DG Elbow Complete Left Result Date: 02/20/2024 CLINICAL DATA:  Pain EXAM: LEFT ELBOW - COMPLETE 3+ VIEW COMPARISON:  None Available. FINDINGS:  Mild spurring in the elbow joint. No acute bony abnormality. Specifically, no fracture, subluxation, or dislocation. Left elbow joint effusion. IMPRESSION: Degenerative changes with elbow joint effusion. No visible fracture. If there is clinical concern for occult fracture, consider immobilization and repeat imaging in 1 week if symptoms persist. Electronically Signed   By: Janeece Mechanic M.D.   On: 02/20/2024 00:05    Labs:  CBC: Recent Labs    02/20/24 1530 02/21/24 0854 02/22/24 1014 02/23/24 0346  WBC 12.0* 9.9 9.8 10.8*  HGB 10.8* 9.7* 10.1* 9.3*  HCT 32.5* 30.5* 30.3* 27.7*  PLT 241 178 231 230    COAGS: Recent Labs    03/11/24 1010  INR 1.2    BMP: Recent Labs    02/20/24 1659 02/21/24 0854 02/22/24 1014 02/23/24 0346  NA 136 137 135 133*  K 3.6 4.0 4.4 4.0  CL 99 101 94* 92*  CO2 25 19* 25 28  GLUCOSE 122* 97 164* 164*  BUN 69* 76* 76* 77*  CALCIUM  7.6* 7.6* 8.3* 8.3*  CREATININE 2.44* 2.75* 2.40* 2.67*  GFRNONAA 29* 25* 30* 26*    LIVER FUNCTION TESTS: Recent Labs    12/10/23 1214 12/15/23 2054 02/20/24 1659 02/21/24 0854 02/22/24 1014 02/23/24 0346  BILITOT 0.7 0.8 1.0 0.8  --   --   AST 17 22 13* 14*  --   --   ALT 14 11 8 7   --   --   ALKPHOS 86 82 72 66  --   --   PROT 7.1 7.1 6.8 7.1  --   --   ALBUMIN  3.5 3.4* 3.3* 3.4* 3.5 3.1*     Assessment and Plan:  Assessed patient at bedside in ED.  Urine noted in brief from spontaneous voiding. Insertion site is unremarkable. Stat lock is no longer attached to skin. Replaced this with new Cinch device. Gentle manual traction suggests balloon remains inflated. On exam, 200 mL yellow urine in collection bag with sediment. SP tube flushed with 100 mL NS which was easily aspirated, contained debris from SP tube and some discomfort noted with flushing. Collection bag reattached and noted to recollect urine while in room with patient. New dressing applied.   No concerns with patency or position of SP  tube. Discussed with Dr. Mabel Savage; no additional imaging or intervention recommended at this time. Recommended f/u with urology for routine exchanges bedside as patient already has a 16Fr councill tip in place.   Shared findings and recommendations with EDP.    Thank you for allowing our service to participate in BRYSYN HOWREN 's care.    Electronically Signed: Terressa Fess, NP   03/16/2024, 12:16 PM     I spent a total of  30 Minutes   in face to face in clinical consultation, greater than 50% of which was counseling/coordinating care for examination of reported malfunction of recently placed SP tube.    (A copy of this note was sent to the referring provider and the time of visit.)

## 2024-03-16 NOTE — ED Provider Notes (Signed)
 [place] EMERGENCY DEPARTMENT AT [facility] Provider Note   CSN: [id number] Arrival date & time: 03/16/24  1059     History  No chief complaint on file.   Shane Chambers is a 63 y.o. male.  63 yo M with a chief complaints of his suprapubic catheter not working.  Patient was at a follow-up visit and the urology office unfortunately thought there was a problem with his catheter and sent him here for evaluation.        Home Medications Prior to Admission medications   Medication Sig Start Date End Date Taking? Authorizing Provider  acetaminophen  (TYLENOL ) 500 MG tablet Take 1 tablet (500 mg total) by mouth every 8 (eight) hours as needed for fever or moderate pain (pain score 4-6). 02/25/24   Pokhrel, Laxman, MD  albuterol  (VENTOLIN  HFA) 108 (90 Base) MCG/ACT inhaler Inhale 2 puffs into the lungs every 6 (six) hours as needed for wheezing or shortness of breath. 05/02/22   Lincoln Renshaw, Clanford L, MD  aspirin  81 MG EC tablet Take 1 tablet (81 mg total) by mouth daily with breakfast. 11/29/20   Quintella Buck, Courage, MD  atorvastatin  (LIPITOR ) 40 MG tablet Take 1 tablet (40 mg total) by mouth at bedtime. 11/28/22   Althia Atlas, MD  bisacodyl  5 MG EC tablet Take 1 tablet (5 mg total) by mouth daily as needed for moderate constipation. 05/28/23   Vinetta Greening, DO  carvedilol  (COREG ) 25 MG tablet Take 1 tablet (25 mg total) by mouth 2 (two) times daily with a meal. 04/02/23   Ozell Blunt, MD  cetirizine (ZYRTEC) 10 MG chewable tablet Chew 10 mg by mouth daily.    [provider]  daptomycin  (CUBICIN ) IVPB Inject 700 mg into the vein daily. Indication:  Septic Joint  First Dose: Yes Last Day of Therapy:  04/01/24 Labs - Once weekly:  CBC/D, BMP, and CPK Labs - Once weekly: ESR and CRP Method of administration: IV Push Method of administration may be changed at the discretion of home infusion pharmacist based upon assessment of the patient and/or caregiver's ability to  self-administer the medication ordered. 02/25/24 04/02/24  Pokhrel, Amador Bad, MD  ertapenem  (INVANZ ) IVPB Inject 1 g into the vein daily. Indication:  Septic joint First Dose: Yes Last Day of Therapy:  04/01/24 Labs - Once weekly:  CBC/D and BMP, Labs - Once weekly: ESR and CRP Method of administration: Mini-Bag Plus / Gravity Method of administration may be changed at the discretion of home infusion pharmacist based upon assessment of the patient and/or caregiver's ability to self-administer the medication ordered. 02/25/24 04/02/24  Pokhrel, Amador Bad, MD  ferrous gluconate  (FERGON) 324 MG tablet Take 324 mg by mouth daily.    [provider]  fluticasone -salmeterol (ADVAIR) 500-50 MCG/ACT AEPB Inhale 1 puff into the lungs in the morning and at bedtime.    [provider]  gabapentin  (NEURONTIN ) 300 MG capsule Take 300 mg by mouth 2 (two) times daily. 10/13/23   [provider]  hydrALAZINE  (APRESOLINE ) 100 MG tablet Take 1 tablet (100 mg total) by mouth 2 (two) times daily. 10/31/23   Justina Oman, MD  hydrocortisone  cream 1 % Apply topically 3 (three) times daily. Apply to itching areas 02/25/24   Pokhrel, Laxman, MD  hydrOXYzine  (ATARAX ) 25 MG tablet Take 1 tablet (25 mg total) by mouth 3 (three) times daily as needed for anxiety. Patient taking differently: Take 25 mg by mouth every 8 (eight) hours as needed for anxiety. 10/31/23  Justina Oman, MD  insulin  aspart (NOVOLOG  FLEXPEN) 100 UNIT/ML FlexPen 0-15 Units, Subcutaneous, 3 times daily with meals CBG < 70: implement hypoglycemia protocol-call MD CBG 70 - 120: 0 units CBG 121 - 150: 2 units CBG 151 - 200: 3 units CBG 201 - 250: 5 units CBG 251 - 300: 8 units CBG 301 - 350: 11 units CBG 351 - 400: 15 units CBG > 400: Patient taking differently: Inject 5 Units into the skin 3 (three) times daily with meals. Hold if BS <150 or >400, notify provider 10/17/22   Burton Casey, MD  ipratropium-albuterol  (DUONEB)  0.5-2.5 (3) MG/3ML SOLN Take 3 mLs by nebulization every 6 (six) hours as needed. Patient taking differently: Take 3 mLs by nebulization every 6 (six) hours as needed (SOB/wheezing). 10/17/22   Ghimire, Estil Heman, MD  levothyroxine  (SYNTHROID ) 75 MCG tablet Take 75 mcg by mouth daily before breakfast.    [provider]  lubiprostone  (AMITIZA ) 8 MCG capsule Take 1 capsule (8 mcg total) by mouth 2 (two) times daily with a meal. 09/29/23   Delman Ferns, NP  metolazone  (ZAROXOLYN ) 2.5 MG tablet Take 1 tablet (2.5 mg total) by mouth once a week. On Wednesdays 10/31/23   Justina Oman, MD  omeprazole  (PRILOSEC) 20 MG capsule Take 20 mg by mouth daily. 01/19/24   [provider]  polyethylene glycol (MIRALAX  / GLYCOLAX ) 17 g packet Take 17 g by mouth daily as needed for moderate constipation.    [provider]  polyvinyl alcohol  (LIQUIFILM TEARS) 1.4 % ophthalmic solution Place 1 drop into both eyes daily.    [provider]  potassium chloride  (KLOR-CON ) 10 MEQ tablet Take 10 mEq by mouth daily.    [provider]  tamsulosin  (FLOMAX ) 0.4 MG CAPS capsule Take 0.8 mg by mouth every evening.    [provider]  thiamine  (VITAMIN B-1) 100 MG tablet Take 1 tablet (100 mg total) by mouth daily. 10/18/22   Ghimire, Estil Heman, MD  torsemide  (DEMADEX ) 20 MG tablet Take 2 tablets (40 mg total) by mouth 2 (two) times daily. 10/31/23   Justina Oman, MD  traZODone  (DESYREL ) 100 MG tablet Take 100 mg by mouth at bedtime. 01/19/24   [provider]  Vitamin D , Ergocalciferol , (DRISDOL ) 1.25 MG (50000 UNIT) CAPS capsule Take 50,000 Units by mouth every 7 (seven) days. (Mondays)    [provider]      Allergies    Ceftriaxone , Ace inhibitors, Chlorhexidine , Haloperidol  and related, Other, Pork allergy, Shellfish allergy, and Penicillins    Review of Systems   Review of Systems  Physical Exam Updated Vital Signs BP (!) 123/57   Pulse (!) 54    Temp 98.4 F (36.9 C)   Resp 18   Ht 5\' 9"  (1.753 m)   Wt 106.6 kg   SpO2 100%   BMI 34.70 kg/m  Physical Exam Vitals and nursing note reviewed.  Constitutional:      Appearance: He is well-developed.  HENT:     Head: Normocephalic and atraumatic.  Eyes:     Pupils: Pupils are equal, round, and reactive to light.  Neck:     Vascular: No JVD.  Cardiovascular:     Rate and Rhythm: Normal rate and regular rhythm.     Heart sounds: No murmur heard.    No friction rub. No gallop.  Pulmonary:     Effort: No respiratory distress.     Breath sounds: No wheezing.  Abdominal:  General: There is no distension.     Tenderness: There is no abdominal tenderness. There is no guarding or rebound.  Musculoskeletal:        General: Normal range of motion.     Cervical back: Normal range of motion and neck supple.  Skin:    Coloration: Skin is not pale.     Findings: No rash.  Neurological:     Mental Status: He is alert and oriented to person, place, and time.  Psychiatric:        Behavior: Behavior normal.     ED Results / Procedures / Treatments   Labs (all labs ordered are listed, but only abnormal results are displayed) Labs Reviewed - No data to display  EKG None  Radiology No results found.  Procedures Procedures    Medications Ordered in ED Medications - No data to display  ED Course/ Medical Decision Making/ A&P                                 Medical Decision Making  24 yoM with a chief complaint of problems with his suprapubic catheter.  Sent here for eval from urology clinic.   IR eval patient at bedside.  Able to flush the catheter without issue.  No obvious issue with the skin.  Will discharge the patient home.  PCP follow-up.  12:15 PM:  I have discussed the diagnosis/risks/treatment options with the patient.  Evaluation and diagnostic testing in the emergency department does not suggest an emergent condition requiring admission or immediate  intervention beyond what has been performed at this time.  They will follow up with PCP. We also discussed returning to the ED immediately if new or worsening sx occur. We discussed the sx which are most concerning (e.g., sudden worsening pain, fever, inability to tolerate by mouth) that necessitate immediate return. Medications administered to the patient during their visit and any new prescriptions provided to the patient are listed below.  Medications given during this visit Medications - No data to display   The patient appears reasonably screen and/or stabilized for discharge and I doubt any other medical condition or other [facility] requiring further screening, evaluation, or treatment in the ED at this time prior to discharge.          Final Clinical Impression(s) / ED Diagnoses Final diagnoses:  Suprapubic catheter [facility])    Rx / DC Orders ED Discharge Orders     None         Albertus Hughs, DO 03/16/24 1215

## 2024-03-16 NOTE — ED Triage Notes (Signed)
 Pt had a suprapubic cath placed on Friday and states it has not been draining and drains blood. Pt states he is urinating from penis. Denies fevers.

## 2024-03-16 NOTE — Discharge Instructions (Signed)
Follow-up with your urologist in the office

## 2024-03-16 NOTE — Telephone Encounter (Signed)
open

## 2024-03-16 NOTE — ED Notes (Signed)
Ptar called eta 1hr

## 2024-03-16 NOTE — Progress Notes (Signed)
 IR [facility]) was called and made aware patient will be transport to [name]  through [facility] transport for IR services for his SP tube that was clogged. Unable to flushed patient SP that was clogged and  placed on 03/11/2024 due to patient mobility.

## 2024-03-16 NOTE — ED Notes (Signed)
 Called [facility] and spoke to [employer]

## 2024-03-17 ENCOUNTER — Ambulatory Visit

## 2024-03-17 ENCOUNTER — Ambulatory Visit: Admitting: Urology

## 2024-03-21 ENCOUNTER — Encounter (HOSPITAL_COMMUNITY): Payer: Self-pay | Admitting: Hematology

## 2024-03-26 NOTE — Progress Notes (Deleted)
 Subjective:  Chief complaint: follow-up for septic arthritis   Patient ID: Shane Chambers, male    DOB: 08-28-1961, 63 y.o.   MRN: [id number]  HPI  63 y.o. male with prior history of CHF, CAD s/p CABG, rectal cancer, COPD, DM, CKD, anemia, BPH depression, kidney stones, GERD, HLD, HTN, hypothyroidism, PVD, sleep apnea, right BKA with revision who presented to the hospital with left elbow pain and is found to have a septic arthritis status post I&D.    Past Medical History:  Diagnosis Date   Acute on chronic combined systolic and diastolic CHF (congestive heart failure) (HCC) 03/05/2020   Acute on chronic heart failure with preserved ejection fraction (HFpEF) (HCC) 03/27/2023   Anemia    Arthritis    CAD (coronary artery disease)    a. s/p CABG in 03/2020 with LIMA-LAD, RIMA-PL, [id number]-OM1   Cancer (HCC)    rectal   Cellulitis    COPD (chronic obstructive pulmonary disease) (HCC)    Depression    Diabetes mellitus without complication (HCC)    GERD (gastroesophageal reflux disease)    History of kidney stones    Hyperlipidemia 12/01/2019   Hypertension    Hypothyroidism    Ischemic cardiomyopathy    a. EF 20-25% by echo in 02/2020 b. at 40% by echo in 03/2020 c. EF normalized to 60-65% by echo in 04/2020   Myocardial infarction [facility])    Peripheral vascular disease (HCC)    Sleep apnea    Type 2 diabetes mellitus (HCC)     Past Surgical History:  Procedure Laterality Date   AMPUTATION Right 10/05/2022   Procedure: AMPUTATION BELOW KNEE;  Surgeon: Timothy Ford, MD;  Location: [facility] OR;  Service: Orthopedics;  Laterality: Right;   AMPUTATION Right 11/14/2022   Procedure: AMPUTATION BELOW KNEE REVISION; WASHOUT;  Surgeon: Jackquelyn Mass, MD;  Location: [facility] ORS;  Service: Vascular;  Laterality: Right;   AMPUTATION Right 11/18/2022   Procedure: AMPUTATION BELOW KNEE REVISION AND CLOSURE;  Surgeon: Jackquelyn Mass, MD;  Location: [facility] ORS;  Service: Vascular;  Laterality:  Right;   APPLICATION OF WOUND VAC Right 10/05/2022   Procedure: APPLICATION OF WOUND VAC;  Surgeon: Timothy Ford, MD;  Location: [facility] OR;  Service: Orthopedics;  Laterality: Right;   BIOPSY  07/18/2021   Procedure: BIOPSY;  Surgeon: Vinetta Greening, DO;  [facility];  Service: Endoscopy;;   BIOPSY  01/09/2022   Procedure: BIOPSY;  Surgeon: Vinetta Greening, DO;  [facility];  Service: Endoscopy;;   BIOPSY  06/29/2023   Procedure: BIOPSY;  Surgeon: Vinetta Greening, DO;  [facility];  Service: Endoscopy;;   COLONOSCOPY WITH PROPOFOL  N/A 07/18/2021   Carver: 15 millimeter polyp removed from the sigmoid colon, 5 mm polyp removed from sigmoid colon.  Nonbleeding internal hemorrhoids.  Significant looping of the colon. sigmoid path showed invasive colonic adenocarcinoma involving tubular adenoma (invades to depth of 2mm, carcinoma 1mm from margin, no lymphovascular invasion, no poorly differentiated component.   COLONOSCOPY WITH PROPOFOL  N/A 06/29/2023   Procedure: COLONOSCOPY WITH PROPOFOL ;  Surgeon: Vinetta Greening, DO;  [facility];  Service: Endoscopy;  Laterality: N/A;  7:30 am, asa 3   CORONARY ARTERY BYPASS GRAFT N/A 03/13/2020   Procedure: CORONARY ARTERY BYPASS GRAFTING (CABG) times five using bilateral Internal mammary arteries and left radial artery;  Surgeon: Rudine Cos, MD;  Location: [facility] OR;  Service: Open Heart Surgery;  Laterality: N/A;  DENTAL SURGERY     ESOPHAGOGASTRODUODENOSCOPY (EGD) WITH PROPOFOL  N/A 07/18/2021   Carver: 1 gastric polyp status post biopsy, gastritis. gastric bx with slight chronic inflammation and no H.pyori. GEJ polypectomy with mild inflammation only   FLEXIBLE SIGMOIDOSCOPY N/A 08/26/2021   Carver: Nonbleeding internal hemorrhoids.  15 mm ulcers from previous polypectomy found in the rectum.  No evidence of previous polyp.  Located 5 to 8 cm from anal verge.   FLEXIBLE SIGMOIDOSCOPY N/A 01/09/2022    Procedure: FLEXIBLE SIGMOIDOSCOPY;  Surgeon: Vinetta Greening, DO;  [facility];  Service: Endoscopy;  Laterality: N/A;   IR CYSTOSTOMY TUBE PLACEMENT/BLADDER ASPIRATION  03/11/2024   IRRIGATION AND DEBRIDEMENT ELBOW Left 02/20/2024   Procedure: LEFT ELBOW IRRIGATION AND DEBRIDEMENT;  Surgeon: Marilee Showers, MD;  Location: [facility] OR;  Service: Orthopedics;  Laterality: Left;   POLYPECTOMY  07/18/2021   Procedure: POLYPECTOMY INTESTINAL;  Surgeon: Vinetta Greening, DO;  [facility];  Service: Endoscopy;;   POLYPECTOMY  06/29/2023   Procedure: POLYPECTOMY INTESTINAL;  Surgeon: Vinetta Greening, DO;  [facility];  Service: Endoscopy;;   RADIAL ARTERY HARVEST Left 03/13/2020   Procedure: RADIAL ARTERY HARVEST,;  Surgeon: Rudine Cos, MD;  Location: [facility] OR;  Service: Open Heart Surgery;  Laterality: Left;   RIGHT/LEFT HEART CATH AND CORONARY ANGIOGRAPHY N/A 03/07/2020   Procedure: RIGHT/LEFT HEART CATH AND CORONARY ANGIOGRAPHY;  Surgeon: Arnoldo Lapping, MD;  Location: [facility] INVASIVE CV LAB;  Service: Cardiovascular;  Laterality: N/A;   STUMP REVISION Right 12/17/2022   Procedure: RIGHT BELOW KNEE AMPUTATION REVISION;  Surgeon: Timothy Ford, MD;  Location: [facility] OR;  Service: Orthopedics;  Laterality: Right;   SUBMUCOSAL LIFTING INJECTION  01/09/2022   Procedure: SUBMUCOSAL LIFTING INJECTION;  Surgeon: Vinetta Greening, DO;  [facility];  Service: Endoscopy;;   SUBMUCOSAL TATTOO INJECTION  01/09/2022   Procedure: SUBMUCOSAL TATTOO INJECTION;  Surgeon: Vinetta Greening, DO;  [facility];  Service: Endoscopy;;   TEE WITHOUT CARDIOVERSION N/A 03/13/2020   Procedure: TRANSESOPHAGEAL ECHOCARDIOGRAM (TEE);  Surgeon: Rudine Cos, MD;  Location: [facility] OR;  Service: Open Heart Surgery;  Laterality: N/A;    Family History  Problem Relation Age of Onset   Hypertension Mother       Social History   Socioeconomic History   Marital status: Single     Spouse name: Not on file   Number of children: Not on file   Years of education: Not on file   Highest education level: Not on file  Occupational History   Not on file  Tobacco Use   Smoking status: Former    Current packs/day: 0.00    Types: Cigarettes    Quit date: 05/25/1995    Years since quitting: 28.8   Smokeless tobacco: Never  Vaping Use   Vaping status: Never Used  Substance and Sexual Activity   Alcohol  use: Not Currently    Comment: rarely   Drug use: No   Sexual activity: Not Currently  Other Topics Concern   Not on file  Social History Narrative   Not on file   Social Drivers of Health   Financial Resource Strain: Not on file  Food Insecurity: No Food Insecurity (02/21/2024)   Hunger Vital Sign    Worried About Running Out of Food in the Last Year: Never true    Ran Out of Food in the Last Year: Never true  Transportation Needs: No Transportation Needs (02/21/2024)  PRAPARE - Administrator, Civil Service (Medical): No    Lack of Transportation (Non-Medical): No  Physical Activity: Not on file  Stress: Not on file  Social Connections: Unknown (02/21/2024)   Social Connection and Isolation Panel [NHANES]    Frequency of Communication with Friends and Family: Not on file    Frequency of Social Gatherings with Friends and Family: Not on file    Attends Religious Services: Not on file    Active Member of Clubs or Organizations: Not on file    Attends Banker Meetings: Not on file    Marital Status: Never married    Allergies  Allergen Reactions   Ceftriaxone  Anaphylaxis    Previously tolerated at [facility] during multiple admissions. Patient reports SOB reaction in [place], Summer 2024. Now reporting SOB reaction at [facility] 02/21/24. No swelling, or skin reaction. Satted well on baseline oxygen. Does not wish to receive Ceftriaxone  in the future.   Ace Inhibitors Swelling and Cough   Chlorhexidine     Haloperidol  And Related      Do NOT give anti-psychotics due to risk of  Torsades and QT prolongation (per Dr. Daphane Dynes request)   Other Itching    [name]   Pork Allergy Other (See Comments)    Not listed in [facility]   Shellfish Allergy     Listed on MAR   Penicillins Itching and Rash    Tolerated ancef  (12-17-22)     Current Outpatient Medications:    acetaminophen  (TYLENOL ) 500 MG tablet, Take 1 tablet (500 mg total) by mouth every 8 (eight) hours as needed for fever or moderate pain (pain score 4-6)., Disp: , Rfl:    albuterol  (VENTOLIN  HFA) 108 (90 Base) MCG/ACT inhaler, Inhale 2 puffs into the lungs every 6 (six) hours as needed for wheezing or shortness of breath., Disp: , Rfl:    aspirin  81 MG EC tablet, Take 1 tablet (81 mg total) by mouth daily with breakfast., Disp: 30 tablet, Rfl: 12   atorvastatin  (LIPITOR ) 40 MG tablet, Take 1 tablet (40 mg total) by mouth at bedtime., Disp: , Rfl:    bisacodyl  5 MG EC tablet, Take 1 tablet (5 mg total) by mouth daily as needed for moderate constipation., Disp: 30 tablet, Rfl: 0   carvedilol  (COREG ) 25 MG tablet, Take 1 tablet (25 mg total) by mouth 2 (two) times daily with a meal., Disp: , Rfl:    cetirizine (ZYRTEC) 10 MG chewable tablet, Chew 10 mg by mouth daily., Disp: , Rfl:    daptomycin  (CUBICIN ) IVPB, Inject 700 mg into the vein daily. Indication:  Septic Joint  First Dose: Yes Last Day of Therapy:  04/01/24 Labs - Once weekly:  CBC/D, BMP, and CPK Labs - Once weekly: ESR and CRP Method of administration: IV Push Method of administration may be changed at the discretion of home infusion pharmacist based upon assessment of the patient and/or caregiver's ability to self-administer the medication ordered., Disp: 37 Units, Rfl: 0   ertapenem  (INVANZ ) IVPB, Inject 1 g into the vein daily. Indication:  Septic joint First Dose: Yes Last Day of Therapy:  04/01/24 Labs - Once weekly:  CBC/D and BMP, Labs - Once weekly: ESR and CRP Method of administration: Mini-Bag Plus /  Gravity Method of administration may be changed at the discretion of home infusion pharmacist based upon assessment of the patient and/or caregiver's ability to self-administer the medication ordered., Disp: 37 Units, Rfl: 0   ferrous gluconate  (  FERGON) 324 MG tablet, Take 324 mg by mouth daily., Disp: , Rfl:    fluticasone -salmeterol (ADVAIR) 500-50 MCG/ACT AEPB, Inhale 1 puff into the lungs in the morning and at bedtime., Disp: , Rfl:    gabapentin  (NEURONTIN ) 300 MG capsule, Take 300 mg by mouth 2 (two) times daily., Disp: , Rfl:    hydrALAZINE  (APRESOLINE ) 100 MG tablet, Take 1 tablet (100 mg total) by mouth 2 (two) times daily., Disp: , Rfl:    hydrocortisone  cream 1 %, Apply topically 3 (three) times daily. Apply to itching areas, Disp: , Rfl:    hydrOXYzine  (ATARAX ) 25 MG tablet, Take 1 tablet (25 mg total) by mouth 3 (three) times daily as needed for anxiety. (Patient taking differently: Take 25 mg by mouth every 8 (eight) hours as needed for anxiety.), Disp: 30 tablet, Rfl: 0   insulin  aspart (NOVOLOG  FLEXPEN) 100 UNIT/ML FlexPen, 0-15 Units, Subcutaneous, 3 times daily with meals CBG < 70: implement hypoglycemia protocol-call MD CBG 70 - 120: 0 units CBG 121 - 150: 2 units CBG 151 - 200: 3 units CBG 201 - 250: 5 units CBG 251 - 300: 8 units CBG 301 - 350: 11 units CBG 351 - 400: 15 units CBG > 400: (Patient taking differently: Inject 5 Units into the skin 3 (three) times daily with meals. Hold if BS <150 or >400, notify provider), Disp: 15 mL, Rfl: 0   ipratropium-albuterol  (DUONEB) 0.5-2.5 (3) MG/3ML SOLN, Take 3 mLs by nebulization every 6 (six) hours as needed. (Patient taking differently: Take 3 mLs by nebulization every 6 (six) hours as needed (SOB/wheezing).), Disp: 360 mL, Rfl:    levothyroxine  (SYNTHROID ) 75 MCG tablet, Take 75 mcg by mouth daily before breakfast., Disp: , Rfl:    lubiprostone  (AMITIZA ) 8 MCG capsule, Take 1 capsule (8 mcg total) by mouth 2 (two) times daily with a meal.,  Disp: 60 capsule, Rfl: 5   metolazone  (ZAROXOLYN ) 2.5 MG tablet, Take 1 tablet (2.5 mg total) by mouth once a week. On Wednesdays, Disp: , Rfl:    omeprazole  (PRILOSEC) 20 MG capsule, Take 20 mg by mouth daily., Disp: , Rfl:    polyethylene glycol (MIRALAX  / GLYCOLAX ) 17 g packet, Take 17 g by mouth daily as needed for moderate constipation., Disp: , Rfl:    polyvinyl alcohol  (LIQUIFILM TEARS) 1.4 % ophthalmic solution, Place 1 drop into both eyes daily., Disp: , Rfl:    potassium chloride  (KLOR-CON ) 10 MEQ tablet, Take 10 mEq by mouth daily., Disp: , Rfl:    tamsulosin  (FLOMAX ) 0.4 MG CAPS capsule, Take 0.8 mg by mouth every evening., Disp: , Rfl:    thiamine  (VITAMIN B-1) 100 MG tablet, Take 1 tablet (100 mg total) by mouth daily., Disp: , Rfl:    torsemide  (DEMADEX ) 20 MG tablet, Take 2 tablets (40 mg total) by mouth 2 (two) times daily., Disp: , Rfl:    traZODone  (DESYREL ) 100 MG tablet, Take 100 mg by mouth at bedtime., Disp: , Rfl:    Vitamin D , Ergocalciferol , (DRISDOL ) 1.25 MG (50000 UNIT) CAPS capsule, Take 50,000 Units by mouth every 7 (seven) days. (Mondays), Disp: , Rfl:     Review of Systems     Objective:   Physical Exam        Assessment & Plan:

## 2024-03-28 ENCOUNTER — Ambulatory Visit: Payer: Self-pay | Admitting: Infectious Disease

## 2024-03-29 LAB — LAB REPORT - SCANNED: EGFR: 32

## 2024-04-12 NOTE — Progress Notes (Unsigned)
 Name: Shane Chambers DOB: 24-Jan-1961 MRN: [id number]  History of Present Illness: Mr. Knutzen is a 63 y.o. male who presents today for follow up visit at [facility] [place]. He resides at [facility] and [employer]. GU History includes: 1. BPH with BOO & LUTS.  - 02/14/2023: Prostate size ~24 ml per CT.  - Taking Flomax  (Tamsulosin ) 0.4 mg daily.  2. Chronic urinary retention.  3. Mild hypospadias. Per exam in ER on 04/16/2023. 4. Kidney stones.  - Has passed stones spontaneously without surgical intervention. - 07/30/2023: CT abdomen/pelvis w/ contrast showed no GU stones, masses, or hydronephrosis; prostate unremarkable.    Recent history: > 03/11/2024: SP tube placed by Interventional Radiology.  > 03/16/2024:  - Seen in Urology office for non-draining SP tube. Unable to assess SP tube insertion site, attempt to irrigate / flush his SP tube, or attempt indwelling Foley catheter placement due to patient's ambulatory dysfunction with inability to stand & pivot from wheelchair onto exam table. - Seen in ER for SP tube evaluation. Evaluated by IR; SP tube flushed without issue.  Today: He reports the SP tube is draining well with clear yellow urine; denies gross hematuria. He denies flank pain. Reports that over the past 3-5 days he has developed lower abdominal pain, burning sensation in his bladder and penis, nausea, mild weakness and fatigue. Denies fevers.   Medications: Current Outpatient Medications  Medication Sig Dispense Refill   acetaminophen  (TYLENOL ) 500 MG tablet Take 1 tablet (500 mg total) by mouth every 8 (eight) hours as needed for fever or moderate pain (pain score 4-6).     albuterol  (VENTOLIN  HFA) 108 (90 Base) MCG/ACT inhaler Inhale 2 puffs into the lungs every 6 (six) hours as needed for wheezing or shortness of breath.     aspirin  81 MG EC tablet Take 1 tablet (81 mg total) by mouth daily with breakfast. 30 tablet 12   atorvastatin   (LIPITOR ) 40 MG tablet Take 1 tablet (40 mg total) by mouth at bedtime.     bisacodyl  5 MG EC tablet Take 1 tablet (5 mg total) by mouth daily as needed for moderate constipation. 30 tablet 0   carvedilol  (COREG ) 25 MG tablet Take 1 tablet (25 mg total) by mouth 2 (two) times daily with a meal.     cetirizine (ZYRTEC) 10 MG chewable tablet Chew 10 mg by mouth daily.     doxycycline  (VIBRAMYCIN ) 100 MG capsule Take 1 capsule (100 mg total) by mouth every 12 (twelve) hours. 14 capsule 0   ferrous gluconate  (FERGON) 324 MG tablet Take 324 mg by mouth daily.     fluticasone -salmeterol (ADVAIR) 500-50 MCG/ACT AEPB Inhale 1 puff into the lungs in the morning and at bedtime.     gabapentin  (NEURONTIN ) 300 MG capsule Take 300 mg by mouth 2 (two) times daily.     hydrALAZINE  (APRESOLINE ) 100 MG tablet Take 1 tablet (100 mg total) by mouth 2 (two) times daily.     hydrocortisone  cream 1 % Apply topically 3 (three) times daily. Apply to itching areas     hydrOXYzine  (ATARAX ) 25 MG tablet Take 1 tablet (25 mg total) by mouth 3 (three) times daily as needed for anxiety. (Patient taking differently: Take 25 mg by mouth every 8 (eight) hours as needed for anxiety.) 30 tablet 0   insulin  aspart (NOVOLOG  FLEXPEN) 100 UNIT/ML FlexPen 0-15 Units, Subcutaneous, 3 times daily with meals CBG < 70: implement hypoglycemia protocol-call MD CBG 70 - 120: 0  units CBG 121 - 150: 2 units CBG 151 - 200: 3 units CBG 201 - 250: 5 units CBG 251 - 300: 8 units CBG 301 - 350: 11 units CBG 351 - 400: 15 units CBG > 400: (Patient taking differently: Inject 5 Units into the skin 3 (three) times daily with meals. Hold if BS <150 or >400, notify provider) 15 mL 0   ipratropium-albuterol  (DUONEB) 0.5-2.5 (3) MG/3ML SOLN Take 3 mLs by nebulization every 6 (six) hours as needed. (Patient taking differently: Take 3 mLs by nebulization every 6 (six) hours as needed (SOB/wheezing).) 360 mL    levothyroxine  (SYNTHROID ) 75 MCG tablet Take 75  mcg by mouth daily before breakfast.     lubiprostone  (AMITIZA ) 8 MCG capsule Take 1 capsule (8 mcg total) by mouth 2 (two) times daily with a meal. 60 capsule 5   metolazone  (ZAROXOLYN ) 2.5 MG tablet Take 1 tablet (2.5 mg total) by mouth once a week. On Wednesdays     omeprazole  (PRILOSEC) 20 MG capsule Take 20 mg by mouth daily.     ondansetron  (ZOFRAN -ODT) 4 MG disintegrating tablet Take 1 tablet (4 mg total) by mouth every 8 (eight) hours as needed for nausea or vomiting. 20 tablet 1   polyethylene glycol (MIRALAX  / GLYCOLAX ) 17 g packet Take 17 g by mouth daily as needed for moderate constipation.     polyvinyl alcohol  (LIQUIFILM TEARS) 1.4 % ophthalmic solution Place 1 drop into both eyes daily.     potassium chloride  (KLOR-CON ) 10 MEQ tablet Take 10 mEq by mouth daily.     tamsulosin  (FLOMAX ) 0.4 MG CAPS capsule Take 0.8 mg by mouth every evening.     thiamine  (VITAMIN B-1) 100 MG tablet Take 1 tablet (100 mg total) by mouth daily.     torsemide  (DEMADEX ) 20 MG tablet Take 2 tablets (40 mg total) by mouth 2 (two) times daily.     traZODone  (DESYREL ) 100 MG tablet Take 100 mg by mouth at bedtime.     Vitamin D , Ergocalciferol , (DRISDOL ) 1.25 MG (50000 UNIT) CAPS capsule Take 50,000 Units by mouth every 7 (seven) days. (Mondays)     No current facility-administered medications for this visit.    Allergies: Allergies  Allergen Reactions   Ceftriaxone  Anaphylaxis    Previously tolerated at [facility] during multiple admissions. Patient reports SOB reaction in [place], Summer 2024. Now reporting SOB reaction at [facility] 02/21/24. No swelling, or skin reaction. Satted well on baseline oxygen. Does not wish to receive Ceftriaxone  in the future.   Ace Inhibitors Swelling and Cough   Chlorhexidine     Haloperidol  And Related     Do NOT give anti-psychotics due to risk of  Torsades and QT prolongation (per Dr. Daphane Dynes request)   Other Itching    [name]   Pork Allergy Other (See  Comments)    Not listed in [facility]   Shellfish Allergy     Listed on MAR   Penicillins Itching and Rash    Tolerated ancef  (12-17-22)    Past Medical History:  Diagnosis Date   Acute on chronic combined systolic and diastolic CHF (congestive heart failure) (HCC) 03/05/2020   Acute on chronic heart failure with preserved ejection fraction (HFpEF) (HCC) 03/27/2023   Anemia    Arthritis    CAD (coronary artery disease)    a. s/p CABG in 03/2020 with LIMA-LAD, RIMA-PL, RA-D1-RI-OM1   Cancer (HCC)    rectal   Cellulitis    COPD (chronic obstructive pulmonary  disease) (HCC)    Depression    Diabetes mellitus without complication (HCC)    GERD (gastroesophageal reflux disease)    History of kidney stones    Hyperlipidemia 12/01/2019   Hypertension    Hypothyroidism    Ischemic cardiomyopathy    a. EF 20-25% by echo in 02/2020 b. at 40% by echo in 03/2020 c. EF normalized to 60-65% by echo in 04/2020   Myocardial infarction [facility])    Peripheral vascular disease (HCC)    Sleep apnea    Type 2 diabetes mellitus [facility])    Past Surgical History:  Procedure Laterality Date   AMPUTATION Right 10/05/2022   Procedure: AMPUTATION BELOW KNEE;  Surgeon: Timothy Ford, MD;  Location: [facility] OR;  Service: Orthopedics;  Laterality: Right;   AMPUTATION Right 11/14/2022   Procedure: AMPUTATION BELOW KNEE REVISION; WASHOUT;  Surgeon: Jackquelyn Mass, MD;  Location: [facility] ORS;  Service: Vascular;  Laterality: Right;   AMPUTATION Right 11/18/2022   Procedure: AMPUTATION BELOW KNEE REVISION AND CLOSURE;  Surgeon: Jackquelyn Mass, MD;  Location: [facility] ORS;  Service: Vascular;  Laterality: Right;   APPLICATION OF WOUND VAC Right 10/05/2022   Procedure: APPLICATION OF WOUND VAC;  Surgeon: Timothy Ford, MD;  Location: [facility] OR;  Service: Orthopedics;  Laterality: Right;   BIOPSY  07/18/2021   Procedure: BIOPSY;  Surgeon: Vinetta Greening, DO;  [facility]: [facility];  Service: Endoscopy;;   BIOPSY  01/09/2022    Procedure: BIOPSY;  Surgeon: Vinetta Greening, DO;  [facility];  Service: Endoscopy;;   BIOPSY  06/29/2023   Procedure: BIOPSY;  Surgeon: Vinetta Greening, DO;  [facility];  Service: Endoscopy;;   COLONOSCOPY WITH PROPOFOL  N/A 07/18/2021   Carver: 15 millimeter polyp removed from the sigmoid colon, 5 mm polyp removed from sigmoid colon.  Nonbleeding internal hemorrhoids.  Significant looping of the colon. sigmoid path showed invasive colonic adenocarcinoma involving tubular adenoma (invades to depth of 2mm, carcinoma 1mm from margin, no lymphovascular invasion, no poorly differentiated component.   COLONOSCOPY WITH PROPOFOL  N/A 06/29/2023   Procedure: COLONOSCOPY WITH PROPOFOL ;  Surgeon: Vinetta Greening, DO;  [facility];  Service: Endoscopy;  Laterality: N/A;  7:30 am, asa 3   CORONARY ARTERY BYPASS GRAFT N/A 03/13/2020   Procedure: CORONARY ARTERY BYPASS GRAFTING (CABG) times five using bilateral Internal mammary arteries and left radial artery;  Surgeon: Rudine Cos, MD;  Location: [facility] OR;  Service: Open Heart Surgery;  Laterality: N/A;   DENTAL SURGERY     ESOPHAGOGASTRODUODENOSCOPY (EGD) WITH PROPOFOL  N/A 07/18/2021   Carver: 1 gastric polyp status post biopsy, gastritis. gastric bx with slight chronic inflammation and no H.pyori. GEJ polypectomy with mild inflammation only   FLEXIBLE SIGMOIDOSCOPY N/A 08/26/2021   Carver: Nonbleeding internal hemorrhoids.  15 mm ulcers from previous polypectomy found in the rectum.  No evidence of previous polyp.  Located 5 to 8 cm from anal verge.   FLEXIBLE SIGMOIDOSCOPY N/A 01/09/2022   Procedure: FLEXIBLE SIGMOIDOSCOPY;  Surgeon: Vinetta Greening, DO;  [facility];  Service: Endoscopy;  Laterality: N/A;   IR CYSTOSTOMY TUBE PLACEMENT/BLADDER ASPIRATION  03/11/2024   IRRIGATION AND DEBRIDEMENT ELBOW Left 02/20/2024   Procedure: LEFT ELBOW IRRIGATION AND DEBRIDEMENT;  Surgeon: Marilee Showers, MD;   Location: [facility] OR;  Service: Orthopedics;  Laterality: Left;   POLYPECTOMY  07/18/2021   Procedure: POLYPECTOMY INTESTINAL;  Surgeon: Vinetta Greening, DO;  [facility];  Service: Endoscopy;;   POLYPECTOMY  06/29/2023   Procedure: POLYPECTOMY INTESTINAL;  Surgeon: Vinetta Greening, DO;  [facility];  Service: Endoscopy;;   RADIAL ARTERY HARVEST Left 03/13/2020   Procedure: RADIAL ARTERY HARVEST,;  Surgeon: Rudine Cos, MD;  Location: [facility] OR;  Service: Open Heart Surgery;  Laterality: Left;   RIGHT/LEFT HEART CATH AND CORONARY ANGIOGRAPHY N/A 03/07/2020   Procedure: RIGHT/LEFT HEART CATH AND CORONARY ANGIOGRAPHY;  Surgeon: Arnoldo Lapping, MD;  Location: [facility] INVASIVE CV LAB;  Service: Cardiovascular;  Laterality: N/A;   STUMP REVISION Right 12/17/2022   Procedure: RIGHT BELOW KNEE AMPUTATION REVISION;  Surgeon: Timothy Ford, MD;  Location: [facility] OR;  Service: Orthopedics;  Laterality: Right;   SUBMUCOSAL LIFTING INJECTION  01/09/2022   Procedure: SUBMUCOSAL LIFTING INJECTION;  Surgeon: Vinetta Greening, DO;  [facility];  Service: Endoscopy;;   SUBMUCOSAL TATTOO INJECTION  01/09/2022   Procedure: SUBMUCOSAL TATTOO INJECTION;  Surgeon: Vinetta Greening, DO;  [facility];  Service: Endoscopy;;   TEE WITHOUT CARDIOVERSION N/A 03/13/2020   Procedure: TRANSESOPHAGEAL ECHOCARDIOGRAM (TEE);  Surgeon: Rudine Cos, MD;  Location: [facility] OR;  Service: Open Heart Surgery;  Laterality: N/A;   Family History  Problem Relation Age of Onset   Hypertension Mother    Social History   Socioeconomic History   Marital status: Single    Spouse name: Not on file   Number of children: Not on file   Years of education: Not on file   Highest education level: Not on file  Occupational History   Not on file  Tobacco Use   Smoking status: Former    Current packs/day: 0.00    Types: Cigarettes    Quit date: 05/25/1995    Years since quitting: 28.9   Smokeless  tobacco: Never  Vaping Use   Vaping status: Never Used  Substance and Sexual Activity   Alcohol  use: Not Currently    Comment: rarely   Drug use: No   Sexual activity: Not Currently  Other Topics Concern   Not on file  Social History Narrative   Not on file   Social Drivers of Health   Financial Resource Strain: Not on file  Food Insecurity: No Food Insecurity (02/21/2024)   Hunger Vital Sign    Worried About Running Out of Food in the Last Year: Never true    Ran Out of Food in the Last Year: Never true  Transportation Needs: No Transportation Needs (02/21/2024)   PRAPARE - Administrator, Civil Service (Medical): No    Lack of Transportation (Non-Medical): No  Physical Activity: Not on file  Stress: Not on file  Social Connections: Unknown (02/21/2024)   Social Connection and Isolation Panel [NHANES]    Frequency of Communication with Friends and Family: Not on file    Frequency of Social Gatherings with Friends and Family: Not on file    Attends Religious Services: Not on file    Active Member of Clubs or Organizations: Not on file    Attends Banker Meetings: Not on file    Marital Status: Never married  Intimate Partner Violence: Not At Risk (02/21/2024)   Humiliation, Afraid, Rape, and Kick questionnaire    Fear of Current or Ex-Partner: No    Emotionally Abused: No    Physically Abused: No    Sexually Abused: No    Review of Systems Constitutional: Patient reports fatigue  lntegumentary: Patient denies any rashes or  pruritus Cardiovascular: Patient denies chest pain or syncope Respiratory: Patient denies shortness of breath Gastrointestinal: Patient reports nausea  Musculoskeletal: Patient reports weakness Neurologic: Patient denies convulsions or seizures Hematologic/Lymphatic: Patient denies bleeding tendencies Endocrine: Patient denies heat/cold intolerance  GU: As per HPI.  OBJECTIVE There were no vitals filed for this  visit. There is no height or weight on file to calculate BMI.  Physical Examination Constitutional: Non-toxic appearing  Cardiovascular: No visible lower extremity edema.  Respiratory: The patient does not have audible wheezing/stridor; respirations do not appear labored  Gastrointestinal: Abdomen non-distended Musculoskeletal: Normal ROM of UEs  Skin: No obvious rashes/open sores  Neurologic: CN 2-12 grossly intact Psychiatric: Answered questions appropriately with normal affect  Hematologic/Lymphatic/Immunologic: No obvious bruises or sites of spontaneous bleeding  GU: SP tube site is dry with no surrounding erythema, warmth, edema, or tenderness. Draining clear yellow urine into catheter bag.  ASSESSMENT Neurogenic bladder - Plan: doxycycline  (VIBRAMYCIN ) 100 MG capsule  Chronic retention of urine - Plan: doxycycline  (VIBRAMYCIN ) 100 MG capsule  Suprapubic catheter (HCC) - Plan: doxycycline  (VIBRAMYCIN ) 100 MG capsule  UTI symptoms - Plan: doxycycline  (VIBRAMYCIN ) 100 MG capsule  Nausea - Plan: ondansetron  (ZOFRAN -ODT) 4 MG disintegrating tablet  SP tube was exchanged by nursing staff under provider supervision with a 18 fr catheter. Patient is aware he will need SP tube exchanges every 4 weeks; will plan to upsize to 20 fr at next visit.   Pt was advised that their urine will always appear infected on UA due to indwelling catheter and that we would not advise antibiotic treatment unless systemic symptoms are present - such as today. Will treat with Doxycycline  for acute UTI symptoms and Zofran  for nausea.    Patient verbalized understanding of and agreement with current plan. All questions were answered.  PLAN Advised the following: 1. SP tube exchanged (18 fr). 2. Doxycycline  100 mg 2x/day for 7 days. 3. Zofran  4 mg sublingual every 8 hours PRN.  4. Return in about 4 weeks (around 05/11/2024) for SP tube exchange (from 47fr to 18fr) and f/u with Griselda Lederer, NP.  No  orders of the defined types were placed in this encounter.  Total time spent caring for the patient today was over 30 minutes. This includes time spent on the date of the visit reviewing the patient's chart before the visit, time spent during the visit, and time spent after the visit on documentation. Over 50% of that time was spent in face-to-face time with this patient for direct counseling. E&M based on time and complexity of medical decision making.  It has been explained that the patient is to follow regularly with their PCP in addition to all other providers involved in their care and to follow instructions provided by these respective offices. Patient advised to contact urology clinic if any urologic-pertaining questions, concerns, new symptoms or problems arise in the interim period.  There are no Patient Instructions on file for this visit.  Electronically signed by:  Lauretta Ponto, FNP   04/13/24    10:10 AM

## 2024-04-13 ENCOUNTER — Encounter: Payer: Self-pay | Admitting: Urology

## 2024-04-13 ENCOUNTER — Ambulatory Visit (INDEPENDENT_AMBULATORY_CARE_PROVIDER_SITE_OTHER): Payer: Medicare Other | Admitting: Urology

## 2024-04-13 VITALS — BP 121/73 | HR 71 | Temp 98.6°F

## 2024-04-13 DIAGNOSIS — R110 Nausea: Secondary | ICD-10-CM

## 2024-04-13 DIAGNOSIS — R339 Retention of urine, unspecified: Secondary | ICD-10-CM

## 2024-04-13 DIAGNOSIS — R399 Unspecified symptoms and signs involving the genitourinary system: Secondary | ICD-10-CM | POA: Diagnosis not present

## 2024-04-13 DIAGNOSIS — N319 Neuromuscular dysfunction of bladder, unspecified: Secondary | ICD-10-CM | POA: Diagnosis not present

## 2024-04-13 DIAGNOSIS — Z9359 Other cystostomy status: Secondary | ICD-10-CM

## 2024-04-13 MED ORDER — ONDANSETRON 4 MG PO TBDP: 4.0000 mg | ORAL_TABLET | Freq: Three times a day (TID) | ORAL | 1 refills | Status: DC | PRN

## 2024-04-13 MED ORDER — DOXYCYCLINE HYCLATE 100 MG PO CAPS: 100.0000 mg | ORAL_CAPSULE | Freq: Two times a day (BID) | ORAL | 0 refills | Status: DC

## 2024-04-13 NOTE — Progress Notes (Signed)
 Suprapubic Cath Change  Patient is present today for a suprapubic catheter change due to urinary retention.  10 ml of water  was drained from the balloon, a 16 FR foley cath was removed from the tract with out difficulty.  Suprapubic catheter site was cleaned and prepped in a sterile fashion with [email or web address]  A 18 FR foley cath was replaced into the tract no complications were noted. Flushed  return was noted, urine Clear yellow in color . 10 ml of sterile water  was inflated into the balloon and a leg bag was attached for drainage.  Patient tolerated well. A night bag was given to patient and proper instruction was given on how to switch bags.    Performed by: Gorden Latino, CMA  Follow up: 4 weeks cath chnage

## 2024-04-14 ENCOUNTER — Encounter (HOSPITAL_COMMUNITY): Payer: Self-pay | Admitting: Hematology

## 2024-04-14 ENCOUNTER — Telehealth: Payer: Self-pay

## 2024-04-14 DIAGNOSIS — R399 Unspecified symptoms and signs involving the genitourinary system: Secondary | ICD-10-CM

## 2024-04-14 DIAGNOSIS — Z9359 Other cystostomy status: Secondary | ICD-10-CM

## 2024-04-14 DIAGNOSIS — N319 Neuromuscular dysfunction of bladder, unspecified: Secondary | ICD-10-CM

## 2024-04-14 DIAGNOSIS — R339 Retention of urine, unspecified: Secondary | ICD-10-CM

## 2024-04-14 MED ORDER — DOXYCYCLINE HYCLATE 100 MG PO CAPS: 100.0000 mg | ORAL_CAPSULE | Freq: Two times a day (BID) | ORAL | 0 refills | Status: DC

## 2024-04-14 NOTE — Telephone Encounter (Signed)
 Pharm tech called to get rx paper faxed over never received Rx

## 2024-04-14 NOTE — Telephone Encounter (Signed)
 Rx faxed to [facility]  for pt abx.

## 2024-05-11 ENCOUNTER — Ambulatory Visit

## 2024-05-19 ENCOUNTER — Ambulatory Visit

## 2024-05-19 DIAGNOSIS — R339 Retention of urine, unspecified: Secondary | ICD-10-CM

## 2024-05-19 DIAGNOSIS — N319 Neuromuscular dysfunction of bladder, unspecified: Secondary | ICD-10-CM

## 2024-05-19 MED ORDER — CIPROFLOXACIN HCL 500 MG PO TABS
500.0000 mg | ORAL_TABLET | Freq: Once | ORAL | Status: AC
  Administered 2024-05-19: 500 mg via ORAL

## 2024-05-19 NOTE — Progress Notes (Signed)
 Suprapubic Cath Change  Patient is present today for a suprapubic catheter change due to urinary retention.  10ml of water  was drained from the balloon, a 18FR foley cath was removed from the tract with out difficulty.  Suprapubic catheter site was cleaned and prepped in a sterile fashion with [email or web address]  A 20FR foley cath was replaced into the tract no complications were noted (see NP Larocco last ov note for upsize request). Urine return was noted, patient passed blot. Patient cath was irrigated with of sterile water . 10 ml of sterile water  was inflated into the balloon and a bed bag was attached for drainage.  Patient tolerated well. A night bag was given to patient and proper instruction was given on how to switch bags.    Performed by: Exie DASEN. CMA  Follow up: 4 weeks

## 2024-05-24 NOTE — Progress Notes (Unsigned)
 Subjective:  Chief Complaint: followup for septic arthritis   Patient ID: Shane Chambers, male    DOB: [birth year], 63 y.o.   MRN: [id number]  HPI   Shane Chambers is a 63 y.o. male with prior history of CHF, CAD s/p CABG, rectal cancer, COPD, DM, CKD, anemia, BPH depression, kidney stones, GERD, HLD, HTN, hypothyroidism, PVD, sleep apnea, right BKA with revision who presented to the hospital with left elbow pain and is found to have a septic arthritis status post I&D.  He is doing quite well with minimal discomfort in his elbow.  He presents for routine follow-up we will review check his inflammatory markers today.   Prior to my initially signing the note and prior to the rest of the visit he had asked to talk to me confidentially and requested HIV testing though he says he has been celibate for 18 years he previously had sex with other men and if he began having sex again it would be with other men and he might benefit from preexposure prophylaxis  Past Medical History:  Diagnosis Date   Acute on chronic combined systolic and diastolic CHF (congestive heart failure) (HCC) 03/05/2020   Acute on chronic heart failure with preserved ejection fraction (HFpEF) (HCC) 03/27/2023   Anemia    Arthritis    CAD (coronary artery disease)    a. s/p CABG in 03/2020 with LIMA-LAD, RIMA-PL, RA-D1-RI-OM1   Cancer (HCC)    rectal   Cellulitis    COPD (chronic obstructive pulmonary disease) (HCC)    Depression    Diabetes mellitus without complication (HCC)    GERD (gastroesophageal reflux disease)    History of kidney stones    Hyperlipidemia 12/01/2019   Hypertension    Hypothyroidism    Ischemic cardiomyopathy    a. EF 20-25% by echo in 02/2020 b. at 40% by echo in 03/2020 c. EF normalized to 60-65% by echo in 04/2020   Myocardial infarction [facility])    Peripheral vascular disease (HCC)    Sleep apnea    Type 2 diabetes mellitus (HCC)     Past Surgical History:  Procedure Laterality  Date   AMPUTATION Right 10/05/2022   Procedure: AMPUTATION BELOW KNEE;  Surgeon: Harden Jerona GAILS, MD;  Location: [facility] OR;  Service: Orthopedics;  Laterality: Right;   AMPUTATION Right 11/14/2022   Procedure: AMPUTATION BELOW KNEE REVISION; WASHOUT;  Surgeon: Jama Cordella MATSU, MD;  Location: [facility] ORS;  Service: Vascular;  Laterality: Right;   AMPUTATION Right 11/18/2022   Procedure: AMPUTATION BELOW KNEE REVISION AND CLOSURE;  Surgeon: Jama Cordella MATSU, MD;  Location: [facility] ORS;  Service: Vascular;  Laterality: Right;   APPLICATION OF WOUND VAC Right 10/05/2022   Procedure: APPLICATION OF WOUND VAC;  Surgeon: Harden Jerona GAILS, MD;  Location: [facility] OR;  Service: Orthopedics;  Laterality: Right;   BIOPSY  07/18/2021   Procedure: BIOPSY;  Surgeon: [name], DO;  [facility];  Service: Endoscopy;;   BIOPSY  01/09/2022   Procedure: BIOPSY;  Surgeon: [name], DO;  [facility];  Service: Endoscopy;;   BIOPSY  06/29/2023   Procedure: BIOPSY;  Surgeon: [name], DO;  [facility];  Service: Endoscopy;;   COLONOSCOPY WITH PROPOFOL  N/A 07/18/2021   Carver: 15 millimeter polyp removed from the sigmoid colon, 5 mm polyp removed from sigmoid colon.  Nonbleeding internal hemorrhoids.  Significant looping of the colon. sigmoid path showed invasive colonic adenocarcinoma involving tubular adenoma (invades  to depth of 2mm, carcinoma 1mm from margin, no lymphovascular invasion, no poorly differentiated component.   COLONOSCOPY WITH PROPOFOL  N/A 06/29/2023   Procedure: COLONOSCOPY WITH PROPOFOL ;  Surgeon: [name], DO;  [facility];  Service: Endoscopy;  Laterality: N/A;  7:30 am, asa 3   CORONARY ARTERY BYPASS GRAFT N/A 03/13/2020   Procedure: CORONARY ARTERY BYPASS GRAFTING (CABG) times five using bilateral Internal mammary arteries and left radial artery;  Surgeon: German Bartlett PEDLAR, MD;  Location: [facility] OR;  Service: Open Heart Surgery;   Laterality: N/A;   DENTAL SURGERY     ESOPHAGOGASTRODUODENOSCOPY (EGD) WITH PROPOFOL  N/A 07/18/2021   Carver: 1 gastric polyp status post biopsy, gastritis. gastric bx with slight chronic inflammation and no H.pyori. GEJ polypectomy with mild inflammation only   FLEXIBLE SIGMOIDOSCOPY N/A 08/26/2021   Carver: Nonbleeding internal hemorrhoids.  15 mm ulcers from previous polypectomy found in the rectum.  No evidence of previous polyp.  Located 5 to 8 cm from anal verge.   FLEXIBLE SIGMOIDOSCOPY N/A 01/09/2022   Procedure: FLEXIBLE SIGMOIDOSCOPY;  Surgeon: [name], DO;  [facility];  Service: Endoscopy;  Laterality: N/A;   IR CYSTOSTOMY TUBE PLACEMENT/BLADDER ASPIRATION  03/11/2024   IRRIGATION AND DEBRIDEMENT ELBOW Left 02/20/2024   Procedure: LEFT ELBOW IRRIGATION AND DEBRIDEMENT;  Surgeon: Germaine Redbird, MD;  Location: [facility] OR;  Service: Orthopedics;  Laterality: Left;   POLYPECTOMY  07/18/2021   Procedure: POLYPECTOMY INTESTINAL;  Surgeon: [name], DO;  [facility];  Service: Endoscopy;;   POLYPECTOMY  06/29/2023   Procedure: POLYPECTOMY INTESTINAL;  Surgeon: [name], DO;  [facility];  Service: Endoscopy;;   RADIAL ARTERY HARVEST Left 03/13/2020   Procedure: RADIAL ARTERY HARVEST,;  Surgeon: German Bartlett PEDLAR, MD;  Location: [facility] OR;  Service: Open Heart Surgery;  Laterality: Left;   RIGHT/LEFT HEART CATH AND CORONARY ANGIOGRAPHY N/A 03/07/2020   Procedure: RIGHT/LEFT HEART CATH AND CORONARY ANGIOGRAPHY;  Surgeon: Wonda Sharper, MD;  Location: [facility] INVASIVE CV LAB;  Service: Cardiovascular;  Laterality: N/A;   STUMP REVISION Right 12/17/2022   Procedure: RIGHT BELOW KNEE AMPUTATION REVISION;  Surgeon: Harden Jerona GAILS, MD;  Location: [facility] OR;  Service: Orthopedics;  Laterality: Right;   SUBMUCOSAL LIFTING INJECTION  01/09/2022   Procedure: SUBMUCOSAL LIFTING INJECTION;  Surgeon: [name], DO;  [facility];  Service:  Endoscopy;;   SUBMUCOSAL TATTOO INJECTION  01/09/2022   Procedure: SUBMUCOSAL TATTOO INJECTION;  Surgeon: [name], DO;  [facility];  Service: Endoscopy;;   TEE WITHOUT CARDIOVERSION N/A 03/13/2020   Procedure: TRANSESOPHAGEAL ECHOCARDIOGRAM (TEE);  Surgeon: German Bartlett PEDLAR, MD;  Location: [facility] OR;  Service: Open Heart Surgery;  Laterality: N/A;    Family History  Problem Relation Age of Onset   Hypertension Mother       Social History   Socioeconomic History   Marital status: Single    Spouse name: Not on file   Number of children: Not on file   Years of education: Not on file   Highest education level: Not on file  Occupational History   Not on file  Tobacco Use   Smoking status: Former    Current packs/day: 0.00    Types: Cigarettes    Quit date: 05/25/1995    Years since quitting: 29.0   Smokeless tobacco: Never  Vaping Use   Vaping status: Never Used  Substance and Sexual Activity   Alcohol  use: Not  Currently    Comment: rarely   Drug use: No   Sexual activity: Not Currently  Other Topics Concern   Not on file  Social History Narrative   Not on file   Social Drivers of Health   Financial Resource Strain: Not on file  Food Insecurity: No Food Insecurity (02/21/2024)   Hunger Vital Sign    Worried About Running Out of Food in the Last Year: Never true    Ran Out of Food in the Last Year: Never true  Transportation Needs: No Transportation Needs (02/21/2024)   PRAPARE - Administrator, Civil Service (Medical): No    Lack of Transportation (Non-Medical): No  Physical Activity: Not on file  Stress: Not on file  Social Connections: Unknown (02/21/2024)   Social Connection and Isolation Panel    Frequency of Communication with Friends and Family: Not on file    Frequency of Social Gatherings with Friends and Family: Not on file    Attends Religious Services: Not on file    Active Member of Clubs or Organizations: Not on file     Attends Banker Meetings: Not on file    Marital Status: Never married    Allergies  Allergen Reactions   Ceftriaxone  Anaphylaxis    Previously tolerated at [facility] during multiple admissions. Patient reports SOB reaction in [place], Summer 2024. Now reporting SOB reaction at [facility] 02/21/24. No swelling, or skin reaction. Satted well on baseline oxygen. Does not wish to receive Ceftriaxone  in the future.   Ace Inhibitors Swelling and Cough   Chlorhexidine     Haloperidol  And Related     Do NOT give anti-psychotics due to risk of  Torsades and QT prolongation (per Dr. Trude request)   Other Itching    [name]   Pork Allergy Other (See Comments)    Not listed in [facility]   Shellfish Allergy     Listed on MAR   Penicillins Itching and Rash    Tolerated ancef  (12-17-22)     Current Outpatient Medications:    acetaminophen  (TYLENOL ) 500 MG tablet, Take 1 tablet (500 mg total) by mouth every 8 (eight) hours as needed for fever or moderate pain (pain score 4-6)., Disp: , Rfl:    albuterol  (VENTOLIN  HFA) 108 (90 Base) MCG/ACT inhaler, Inhale 2 puffs into the lungs every 6 (six) hours as needed for wheezing or shortness of breath., Disp: , Rfl:    aspirin  81 MG EC tablet, Take 1 tablet (81 mg total) by mouth daily with breakfast., Disp: 30 tablet, Rfl: 12   atorvastatin  (LIPITOR ) 40 MG tablet, Take 1 tablet (40 mg total) by mouth at bedtime., Disp: , Rfl:    bisacodyl  5 MG EC tablet, Take 1 tablet (5 mg total) by mouth daily as needed for moderate constipation., Disp: 30 tablet, Rfl: 0   carvedilol  (COREG ) 25 MG tablet, Take 1 tablet (25 mg total) by mouth 2 (two) times daily with a meal., Disp: , Rfl:    cetirizine (ZYRTEC) 10 MG chewable tablet, Chew 10 mg by mouth daily., Disp: , Rfl:    doxycycline  (VIBRAMYCIN ) 100 MG capsule, Take 1 capsule (100 mg total) by mouth every 12 (twelve) hours., Disp: 14 capsule, Rfl: 0   ferrous gluconate  (FERGON) 324 MG tablet, Take  324 mg by mouth daily., Disp: , Rfl:    fluticasone -salmeterol (ADVAIR) 500-50 MCG/ACT AEPB, Inhale 1 puff into the lungs in the morning and at bedtime., Disp: , Rfl:  gabapentin  (NEURONTIN ) 300 MG capsule, Take 300 mg by mouth 2 (two) times daily., Disp: , Rfl:    hydrALAZINE  (APRESOLINE ) 100 MG tablet, Take 1 tablet (100 mg total) by mouth 2 (two) times daily., Disp: , Rfl:    hydrocortisone  cream 1 %, Apply topically 3 (three) times daily. Apply to itching areas, Disp: , Rfl:    hydrOXYzine  (ATARAX ) 25 MG tablet, Take 1 tablet (25 mg total) by mouth 3 (three) times daily as needed for anxiety. (Patient taking differently: Take 25 mg by mouth every 8 (eight) hours as needed for anxiety.), Disp: 30 tablet, Rfl: 0   insulin  aspart (NOVOLOG  FLEXPEN) 100 UNIT/ML FlexPen, 0-15 Units, Subcutaneous, 3 times daily with meals CBG < 70: implement hypoglycemia protocol-call MD CBG 70 - 120: 0 units CBG 121 - 150: 2 units CBG 151 - 200: 3 units CBG 201 - 250: 5 units CBG 251 - 300: 8 units CBG 301 - 350: 11 units CBG 351 - 400: 15 units CBG > 400: (Patient taking differently: Inject 5 Units into the skin 3 (three) times daily with meals. Hold if BS <150 or >400, notify provider), Disp: 15 mL, Rfl: 0   ipratropium-albuterol  (DUONEB) 0.5-2.5 (3) MG/3ML SOLN, Take 3 mLs by nebulization every 6 (six) hours as needed. (Patient taking differently: Take 3 mLs by nebulization every 6 (six) hours as needed (SOB/wheezing).), Disp: 360 mL, Rfl:    levothyroxine  (SYNTHROID ) 75 MCG tablet, Take 75 mcg by mouth daily before breakfast., Disp: , Rfl:    lubiprostone  (AMITIZA ) 8 MCG capsule, Take 1 capsule (8 mcg total) by mouth 2 (two) times daily with a meal., Disp: 60 capsule, Rfl: 5   metolazone  (ZAROXOLYN ) 2.5 MG tablet, Take 1 tablet (2.5 mg total) by mouth once a week. On Wednesdays, Disp: , Rfl:    omeprazole  (PRILOSEC) 20 MG capsule, Take 20 mg by mouth daily., Disp: , Rfl:    ondansetron  (ZOFRAN -ODT) 4 MG  disintegrating tablet, Take 1 tablet (4 mg total) by mouth every 8 (eight) hours as needed for nausea or vomiting., Disp: 20 tablet, Rfl: 1   polyethylene glycol (MIRALAX  / GLYCOLAX ) 17 g packet, Take 17 g by mouth daily as needed for moderate constipation., Disp: , Rfl:    polyvinyl alcohol  (LIQUIFILM TEARS) 1.4 % ophthalmic solution, Place 1 drop into both eyes daily., Disp: , Rfl:    potassium chloride  (KLOR-CON ) 10 MEQ tablet, Take 10 mEq by mouth daily., Disp: , Rfl:    tamsulosin  (FLOMAX ) 0.4 MG CAPS capsule, Take 0.8 mg by mouth every evening., Disp: , Rfl:    thiamine  (VITAMIN B-1) 100 MG tablet, Take 1 tablet (100 mg total) by mouth daily., Disp: , Rfl:    torsemide  (DEMADEX ) 20 MG tablet, Take 2 tablets (40 mg total) by mouth 2 (two) times daily., Disp: , Rfl:    traZODone  (DESYREL ) 100 MG tablet, Take 100 mg by mouth at bedtime., Disp: , Rfl:    Vitamin D , Ergocalciferol , (DRISDOL ) 1.25 MG (50000 UNIT) CAPS capsule, Take 50,000 Units by mouth every 7 (seven) days. (Mondays), Disp: , Rfl:     Review of Systems  Constitutional:  Negative for activity change, appetite change, chills, diaphoresis, fatigue, fever and unexpected weight change.  HENT:  Negative for congestion, rhinorrhea, sinus pressure, sneezing, sore throat and trouble swallowing.   Eyes:  Negative for photophobia and visual disturbance.  Respiratory:  Negative for cough, chest tightness, shortness of breath, wheezing and stridor.   Cardiovascular:  Negative for chest  pain, palpitations and leg swelling.  Gastrointestinal:  Negative for abdominal distention, abdominal pain, anal bleeding, blood in stool, constipation, diarrhea, nausea and vomiting.  Genitourinary:  Negative for difficulty urinating, dysuria, flank pain and hematuria.  Musculoskeletal:  Negative for arthralgias, back pain, gait problem, joint swelling and myalgias.  Skin:  Negative for color change, pallor, rash and wound.  Neurological:  Negative for  dizziness, tremors, weakness and light-headedness.  Hematological:  Negative for adenopathy. Does not bruise/bleed easily.  Psychiatric/Behavioral:  Negative for agitation, behavioral problems, confusion, decreased concentration, dysphoric mood and sleep disturbance.        Objective:   Physical Exam Constitutional:      Appearance: He is well-developed.  HENT:     Head: Normocephalic and atraumatic.  Eyes:     Conjunctiva/sclera: Conjunctivae normal.  Cardiovascular:     Rate and Rhythm: Normal rate and regular rhythm.  Pulmonary:     Effort: Pulmonary effort is normal. No respiratory distress.     Breath sounds: No wheezing.  Abdominal:     General: There is no distension.     Palpations: Abdomen is soft.  Musculoskeletal:        General: No tenderness. Normal range of motion.     Cervical back: Normal range of motion and neck supple.  Skin:    General: Skin is warm and dry.     Coloration: Skin is not pale.     Findings: No erythema or rash.  Neurological:     General: No focal deficit present.     Mental Status: He is alert and oriented to person, place, and time.  Psychiatric:        Mood and Affect: Mood normal.        Behavior: Behavior normal.        Thought Content: Thought content normal.        Judgment: Judgment normal.     Left elbow:        Assessment & Plan:   Septic arthritis:  Appears  clinically resolved but will check sed rate CRP CBC with differential and BMP with GFR.  HIV testing and STI testing: will screen for HIV and syphilis. IF he becomes sexually active would strongly recommend PrEP

## 2024-05-25 ENCOUNTER — Other Ambulatory Visit: Payer: Self-pay

## 2024-05-25 ENCOUNTER — Encounter: Payer: Self-pay | Admitting: Infectious Disease

## 2024-05-25 ENCOUNTER — Ambulatory Visit (INDEPENDENT_AMBULATORY_CARE_PROVIDER_SITE_OTHER): Admitting: Infectious Disease

## 2024-05-25 VITALS — BP 116/69 | HR 60 | Temp 97.6°F

## 2024-05-25 DIAGNOSIS — Z114 Encounter for screening for human immunodeficiency virus [HIV]: Secondary | ICD-10-CM

## 2024-05-25 DIAGNOSIS — Z951 Presence of aortocoronary bypass graft: Secondary | ICD-10-CM

## 2024-05-25 DIAGNOSIS — Z89511 Acquired absence of right leg below knee: Secondary | ICD-10-CM | POA: Diagnosis not present

## 2024-05-25 DIAGNOSIS — Z113 Encounter for screening for infections with a predominantly sexual mode of transmission: Secondary | ICD-10-CM

## 2024-05-25 DIAGNOSIS — Z2981 Encounter for HIV pre-exposure prophylaxis: Secondary | ICD-10-CM | POA: Diagnosis not present

## 2024-05-25 DIAGNOSIS — M009 Pyogenic arthritis, unspecified: Secondary | ICD-10-CM | POA: Diagnosis present

## 2024-05-25 DIAGNOSIS — Z7189 Other specified counseling: Secondary | ICD-10-CM | POA: Diagnosis not present

## 2024-05-25 NOTE — Addendum Note (Signed)
 Addended by: FLEETA KATHIE FLEET N on: 05/25/2024 12:15 PM   Modules accepted: Level of Service

## 2024-05-26 ENCOUNTER — Ambulatory Visit: Payer: Self-pay

## 2024-05-26 ENCOUNTER — Encounter (HOSPITAL_COMMUNITY): Payer: Self-pay | Admitting: Hematology

## 2024-05-26 LAB — BASIC METABOLIC PANEL WITHOUT GFR
BUN/Creatinine Ratio: 26 (calc) — ABNORMAL HIGH (ref 6–22)
BUN: 71 mg/dL — ABNORMAL HIGH (ref 7–25)
CO2: 32 mmol/L (ref 20–32)
Calcium: 9.7 mg/dL (ref 8.6–10.3)
Chloride: 90 mmol/L — ABNORMAL LOW (ref 98–110)
Creat: 2.74 mg/dL — ABNORMAL HIGH (ref 0.70–1.35)
Glucose, Bld: 120 mg/dL — ABNORMAL HIGH (ref 65–99)
Potassium: 3.8 mmol/L (ref 3.5–5.3)
Sodium: 134 mmol/L — ABNORMAL LOW (ref 135–146)

## 2024-05-26 LAB — CBC WITH DIFFERENTIAL/PLATELET
Absolute Lymphocytes: 2055 {cells}/uL (ref 850–3900)
Absolute Monocytes: 748 {cells}/uL (ref 200–950)
Basophils Absolute: 52 {cells}/uL (ref 0–200)
Basophils Relative: 0.6 %
Eosinophils Absolute: 490 {cells}/uL (ref 15–500)
Eosinophils Relative: 5.7 %
HCT: 30.6 % — ABNORMAL LOW (ref 38.5–50.0)
Hemoglobin: 9.7 g/dL — ABNORMAL LOW (ref 13.2–17.1)
MCH: 29 pg (ref 27.0–33.0)
MCHC: 31.7 g/dL — ABNORMAL LOW (ref 32.0–36.0)
MCV: 91.3 fL (ref 80.0–100.0)
MPV: 10.3 fL (ref 7.5–12.5)
Monocytes Relative: 8.7 %
Neutro Abs: 5255 {cells}/uL (ref 1500–7800)
Neutrophils Relative %: 61.1 %
Platelets: 287 Thousand/uL (ref 140–400)
RBC: 3.35 Million/uL — ABNORMAL LOW (ref 4.20–5.80)
RDW: 12 % (ref 11.0–15.0)
Total Lymphocyte: 23.9 %
WBC: 8.6 Thousand/uL (ref 3.8–10.8)

## 2024-05-26 LAB — HIV ANTIBODY (ROUTINE TESTING W REFLEX): HIV 1&2 Ab, 4th Generation: NONREACTIVE

## 2024-05-26 LAB — RPR: RPR Ser Ql: NONREACTIVE

## 2024-05-26 LAB — SEDIMENTATION RATE: Sed Rate: 77 mm/h — ABNORMAL HIGH (ref 0–20)

## 2024-05-26 LAB — C-REACTIVE PROTEIN: CRP: 8.1 mg/L — ABNORMAL HIGH (ref <8.0)

## 2024-05-27 ENCOUNTER — Other Ambulatory Visit: Payer: Self-pay | Admitting: Medical Genetics

## 2024-06-08 ENCOUNTER — Other Ambulatory Visit: Payer: Self-pay

## 2024-06-08 DIAGNOSIS — D509 Iron deficiency anemia, unspecified: Secondary | ICD-10-CM

## 2024-06-08 DIAGNOSIS — C20 Malignant neoplasm of rectum: Secondary | ICD-10-CM

## 2024-06-08 DIAGNOSIS — D649 Anemia, unspecified: Secondary | ICD-10-CM

## 2024-06-09 ENCOUNTER — Inpatient Hospital Stay: Payer: Medicare Other

## 2024-06-09 ENCOUNTER — Inpatient Hospital Stay: Payer: Medicare Other | Attending: Hematology | Admitting: Hematology

## 2024-06-09 ENCOUNTER — Other Ambulatory Visit (HOSPITAL_COMMUNITY)

## 2024-06-09 VITALS — BP 126/62 | HR 55 | Temp 97.8°F | Resp 16

## 2024-06-09 DIAGNOSIS — D649 Anemia, unspecified: Secondary | ICD-10-CM | POA: Insufficient documentation

## 2024-06-09 DIAGNOSIS — Z85048 Personal history of other malignant neoplasm of rectum, rectosigmoid junction, and anus: Secondary | ICD-10-CM | POA: Diagnosis present

## 2024-06-09 DIAGNOSIS — C20 Malignant neoplasm of rectum: Secondary | ICD-10-CM

## 2024-06-09 DIAGNOSIS — D509 Iron deficiency anemia, unspecified: Secondary | ICD-10-CM | POA: Diagnosis not present

## 2024-06-09 LAB — COMPREHENSIVE METABOLIC PANEL WITH GFR
ALT: 11 U/L (ref 0–44)
AST: 19 U/L (ref 15–41)
Albumin: 3.6 g/dL (ref 3.5–5.0)
Alkaline Phosphatase: 106 U/L (ref 38–126)
Anion gap: 11 (ref 5–15)
BUN: 71 mg/dL — ABNORMAL HIGH (ref 8–23)
CO2: 32 mmol/L (ref 22–32)
Calcium: 9.3 mg/dL (ref 8.9–10.3)
Chloride: 94 mmol/L — ABNORMAL LOW (ref 98–111)
Creatinine, Ser: 2.33 mg/dL — ABNORMAL HIGH (ref 0.61–1.24)
GFR, Estimated: 31 mL/min — ABNORMAL LOW (ref >60)
Glucose, Bld: 153 mg/dL — ABNORMAL HIGH (ref 70–99)
Potassium: 3.8 mmol/L (ref 3.5–5.1)
Sodium: 137 mmol/L (ref 135–145)
Total Bilirubin: 0.9 mg/dL (ref 0.0–1.2)
Total Protein: 7.1 g/dL (ref 6.5–8.1)

## 2024-06-09 LAB — CBC WITH DIFFERENTIAL/PLATELET
Abs Immature Granulocytes: 0.07 K/uL (ref 0.00–0.07)
Basophils Absolute: 0 K/uL (ref 0.0–0.1)
Basophils Relative: 1 %
Eosinophils Absolute: 0.6 K/uL — ABNORMAL HIGH (ref 0.0–0.5)
Eosinophils Relative: 7 %
HCT: 30 % — ABNORMAL LOW (ref 39.0–52.0)
Hemoglobin: 9.8 g/dL — ABNORMAL LOW (ref 13.0–17.0)
Immature Granulocytes: 1 %
Lymphocytes Relative: 12 %
Lymphs Abs: 0.9 K/uL (ref 0.7–4.0)
MCH: 30.4 pg (ref 26.0–34.0)
MCHC: 32.7 g/dL (ref 30.0–36.0)
MCV: 93.2 fL (ref 80.0–100.0)
Monocytes Absolute: 0.5 K/uL (ref 0.1–1.0)
Monocytes Relative: 6 %
Neutro Abs: 6.1 K/uL (ref 1.7–7.7)
Neutrophils Relative %: 73 %
Platelets: 204 K/uL (ref 150–400)
RBC: 3.22 MIL/uL — ABNORMAL LOW (ref 4.22–5.81)
RDW: 12.5 % (ref 11.5–15.5)
WBC: 8.2 K/uL (ref 4.0–10.5)
nRBC: 0 % (ref 0.0–0.2)

## 2024-06-09 LAB — IRON AND TIBC
Iron: 70 ug/dL (ref 45–182)
Saturation Ratios: 27 % (ref 17.9–39.5)
TIBC: 255 ug/dL (ref 250–450)
UIBC: 185 ug/dL

## 2024-06-09 LAB — FOLATE: Folate: 8.4 ng/mL (ref >5.9)

## 2024-06-09 LAB — VITAMIN B12: Vitamin B-12: 492 pg/mL (ref 180–914)

## 2024-06-09 LAB — FERRITIN: Ferritin: 444 ng/mL — ABNORMAL HIGH (ref 24–336)

## 2024-06-09 NOTE — Progress Notes (Signed)
 Shane Chambers 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 - Pathology of the sigmoid polypectomy shows invasive colonic adenocarcinoma involving a tubular adenoma.  Carcinoma invades to a depth of 2 mm.  Carcinoma is 1 mm from the margin.  No lymphovascular invasion.  No poorly differentiated component. - Flexible sigmoidoscopy on 08/26/2021 with 15 mm ulcer from previous polypectomy found in the rectum.  This is in the rectum approximately 5 to 8 cm from the anal verge. - CT chest and abdomen with contrast from 09/12/2021 with no sign of metastatic disease in the chest.  Top normal size of left parotic lymph node measuring 10 to 12 mm but with fatty hila, nonspecific. - MRI of the pelvis without contrast from 09/12/2021 was suboptimal due to rectal spasm and gross patient motion which limits assessment.  Site of biopsy not visible and not well assessed.  No gross extracolonic soft tissue.  Potential left meso rectal lymph node within 1 to 2 mm of mesorectal fascia.  - CT pelvis in December 2022 showed scattered pelvic lymph nodes and left para-aortic lymph node. - He was evaluated by Shane [facility].  Pathologist reviewed his slides and felt like his cancer was superficial. - He underwent sigmoidoscopy by Dr. Cindie and biopsy at the previous resection site which showed benign colonic  mucosa with benign lymphoid aggregate.  No adenomatous change or malignancy.   Social/family history: - He is a retired Engineer, civil (consulting).  He works for [employer]. - He quit smoking in 1996. - He is currently residing at [facility] in [place] since January 2022.  He was originally admitted there for rehab after cellulitis.  He reports that he will soon be discharged to a community apartment. - Maternal great aunt had breast cancer.  No other malignancies.  Plan: Rectal adenocarcinoma in a polyp, MSI-stable, MMR preserved: - Denies any change in bowel habits.  No bleeding per rectum or melena. - CTAP on 07/30/2023 with no evidence of recurrence. - Last colonoscopy in August 2024 with nonbleeding internal hemorrhoids.  Rectal polyp removal site with scar tissue. - Labs from 06/09/2024: Normal LFTs.  CBC grossly normal.  CEA is pending.  Last CEA was normal. - Recommended follow-up in 1 year with repeat labs and CEA.  No imaging necessary.   2.  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 - Ferritin is 444, saturation is 27.  Folic acid  and B12 are normal.  Hemoglobin is stable at 9.8.  Will consider erythropoiesis stimulating agents if hemoglobin drops below 8-9.  Orders Placed This Encounter  Procedures   CBC with Differential    Standing Status:   Future    Expiration Date:   06/09/2025   Comprehensive metabolic panel    Standing Status:   Future    Expiration Date:   06/09/2025   CEA    Standing  Status:   Future    Expiration Date:   06/09/2025   Iron  and TIBC (Shane Chambers ONLY)    Standing Status:   Future    Expiration Date:   06/09/2025   Ferritin    Standing Status:   Future    Expiration Date:   06/09/2025      Shane Chambers,acting as a scribe for Shane Stands, Chambers.,have documented all relevant documentation on the behalf of Shane Stands, Chambers,as directed by  Shane Stands,  Chambers while in the presence of Shane Stands, Chambers.  I, Shane Chambers, have reviewed the above documentation for accuracy and completeness, and I agree with the above.     Shane Stands, Chambers   7/31/202512:48 PM  CHIEF COMPLAINT:   Diagnosis: rectal cancer   Cancer Staging  Rectal cancer [facility]) Staging form: Colon and Rectum, AJCC 8th Edition - Clinical stage from 09/03/2021: Stage 0 (cTis, cN0, cM0) - Unsigned    Prior Therapy: none  Current Therapy:  surveillance   HISTORY OF PRESENT ILLNESS:   Oncology History   No history exists.     INTERVAL HISTORY:   Shane Chambers is a 63 y.o. male presenting to clinic today for follow up of rectal adenocarcinoma. He was last seen by me on 12/10/2023.  Since his last visit, he was admitted to the [facility] from 02/20/2024 to 02/25/2024 for septic left elbow joint. He is s/p irrigation and debridement of the left elbow wound.   Today, he states that he is doing well overall. His appetite level is at 20%. His energy level is at 40-50%. Shane Chambers is accompanied by a family member.  He notes normal BM's considering IBM symptoms. He denies any blood per rectum or hematochezia.  PAST MEDICAL HISTORY:   Past Medical History: Past Medical History:  Diagnosis Date   Acute on chronic combined systolic and diastolic CHF (congestive heart failure) (HCC) 03/05/2020   Acute on chronic heart failure with preserved ejection fraction (HFpEF) (HCC) 03/27/2023   Anemia    Arthritis    CAD (coronary artery disease)    a. s/p CABG in 03/2020 with LIMA-LAD, RIMA-PL, RA-[id number]-OM1   Cancer (HCC)    rectal   Cellulitis    COPD (chronic obstructive pulmonary disease) (HCC)    Depression    Diabetes mellitus without complication (HCC)    GERD (gastroesophageal reflux disease)    History of kidney stones    Hyperlipidemia 12/01/2019   Hypertension    Hypothyroidism    Ischemic cardiomyopathy    a. EF 20-25% by echo in 02/2020 b. at 40% by  echo in 03/2020 c. EF normalized to 60-65% by echo in 04/2020   Myocardial infarction [facility])    Peripheral vascular disease (HCC)    Sleep apnea    Type 2 diabetes mellitus (HCC)     Surgical History: Past Surgical History:  Procedure Laterality Date   AMPUTATION Right 10/05/2022   Procedure: AMPUTATION BELOW KNEE;  Surgeon: Harden Jerona GAILS, Chambers;  Location: [facility] OR;  Service: Orthopedics;  Laterality: Right;   AMPUTATION Right 11/14/2022   Procedure: AMPUTATION BELOW KNEE REVISION; WASHOUT;  Surgeon: Jama Cordella MATSU, Chambers;  Location: [facility] ORS;  Service: Vascular;  Laterality: Right;   AMPUTATION Right 11/18/2022   Procedure: AMPUTATION BELOW KNEE REVISION AND CLOSURE;  Surgeon: Jama Cordella MATSU, Chambers;  Location: [facility] ORS;  Service: Vascular;  Laterality: Right;   APPLICATION OF WOUND VAC Right 10/05/2022   Procedure: APPLICATION OF WOUND VAC;  Surgeon: Harden,  Jerona GAILS, Chambers;  Location: [facility] OR;  Service: Orthopedics;  Laterality: Right;   BIOPSY  07/18/2021   Procedure: BIOPSY;  Surgeon: Shane Carlin POUR, DO;  [facility];  Service: Endoscopy;;   BIOPSY  01/09/2022   Procedure: BIOPSY;  Surgeon: Shane Carlin POUR, DO;  [facility];  Service: Endoscopy;;   BIOPSY  06/29/2023   Procedure: BIOPSY;  Surgeon: Shane Carlin POUR, DO;  [facility];  Service: Endoscopy;;   COLONOSCOPY WITH PROPOFOL  N/A 07/18/2021   Carver: 15 millimeter polyp removed from the sigmoid colon, 5 mm polyp removed from sigmoid colon.  Nonbleeding internal hemorrhoids.  Significant looping of the colon. sigmoid path showed invasive colonic adenocarcinoma involving tubular adenoma (invades to depth of 2mm, carcinoma 1mm from margin, no lymphovascular invasion, no poorly differentiated component.   COLONOSCOPY WITH PROPOFOL  N/A 06/29/2023   Procedure: COLONOSCOPY WITH PROPOFOL ;  Surgeon: Shane Carlin POUR, DO;  [facility];  Service: Endoscopy;  Laterality: N/A;  7:30 am, asa 3   CORONARY  ARTERY BYPASS GRAFT N/A 03/13/2020   Procedure: CORONARY ARTERY BYPASS GRAFTING (CABG) times five using bilateral Internal mammary arteries and left radial artery;  Surgeon: German Bartlett PEDLAR, Chambers;  Location: [facility] OR;  Service: Open Heart Surgery;  Laterality: N/A;   DENTAL SURGERY     ESOPHAGOGASTRODUODENOSCOPY (EGD) WITH PROPOFOL  N/A 07/18/2021   Carver: 1 gastric polyp status post biopsy, gastritis. gastric bx with slight chronic inflammation and no H.pyori. GEJ polypectomy with mild inflammation only   FLEXIBLE SIGMOIDOSCOPY N/A 08/26/2021   Carver: Nonbleeding internal hemorrhoids.  15 mm ulcers from previous polypectomy found in the rectum.  No evidence of previous polyp.  Located 5 to 8 cm from anal verge.   FLEXIBLE SIGMOIDOSCOPY N/A 01/09/2022   Procedure: FLEXIBLE SIGMOIDOSCOPY;  Surgeon: Shane Carlin POUR, DO;  [facility];  Service: Endoscopy;  Laterality: N/A;   IR CYSTOSTOMY TUBE PLACEMENT/BLADDER ASPIRATION  03/11/2024   IRRIGATION AND DEBRIDEMENT ELBOW Left 02/20/2024   Procedure: LEFT ELBOW IRRIGATION AND DEBRIDEMENT;  Surgeon: Germaine Redbird, Chambers;  Location: [facility];  Service: Orthopedics;  Laterality: Left;   POLYPECTOMY  07/18/2021   Procedure: POLYPECTOMY INTESTINAL;  Surgeon: Shane Carlin POUR, DO;  [facility];  Service: Endoscopy;;   POLYPECTOMY  06/29/2023   Procedure: POLYPECTOMY INTESTINAL;  Surgeon: Shane Carlin POUR, DO;  [facility];  Service: Endoscopy;;   RADIAL ARTERY HARVEST Left 03/13/2020   Procedure: RADIAL ARTERY HARVEST,;  Surgeon: German Bartlett PEDLAR, Chambers;  Location: [facility] OR;  Service: Open Heart Surgery;  Laterality: Left;   RIGHT/LEFT HEART CATH AND CORONARY ANGIOGRAPHY N/A 03/07/2020   Procedure: RIGHT/LEFT HEART CATH AND CORONARY ANGIOGRAPHY;  Surgeon: Wonda Sharper, Chambers;  Location: [facility] IN[facility] CV [facility];  Service: Cardiovascular;  Laterality: N/A;   STUMP REVISION Right 12/17/2022   Procedure: RIGHT BELOW KNEE AMPUTATION REVISION;   Surgeon: Harden Jerona GAILS, Chambers;  Location: [facility] OR;  Service: Orthopedics;  Laterality: Right;   SUBMUCOSAL LIFTING INJECTION  01/09/2022   Procedure: SUBMUCOSAL LIFTING INJECTION;  Surgeon: Shane Carlin POUR, DO;  [facility];  Service: Endoscopy;;   SUBMUCOSAL TATTOO INJECTION  01/09/2022   Procedure: SUBMUCOSAL TATTOO INJECTION;  Surgeon: Shane Carlin POUR, DO;  [facility];  Service: Endoscopy;;   TEE WITHOUT CARDIOVERSION N/A 03/13/2020   Procedure: TRANSESOPHAGEAL ECHOCARDIOGRAM (TEE);  Surgeon: German Bartlett PEDLAR, Chambers;  Location: [facility] OR;  Service: Open Heart Surgery;  Laterality: N/A;  Social History: Social History   Socioeconomic History   Marital status: Single    Spouse name: Not on file   Number of children: Not on file   Years of education: Not on file   Highest education level: Not on file  Occupational History   Not on file  Tobacco Use   Smoking status: Former    Current packs/day: 0.00    Types: Cigarettes    Quit date: 05/25/1995    Years since quitting: 29.0   Smokeless tobacco: Never  Vaping Use   Vaping status: Never Used  Substance and Sexual Activity   Alcohol  use: Not Currently    Comment: rarely   Drug use: No   Sexual activity: Not Currently  Other Topics Concern   Not on file  Social History Narrative   Not on file   Social Drivers of Health   Financial Resource Strain: Not on file  Food Insecurity: No Food Insecurity (02/21/2024)   Hunger Vital Sign    Worried About Running Out of Food in the Last Year: Never true    Ran Out of Food in the Last Year: Never true  Transportation Needs: No Transportation Needs (02/21/2024)   PRAPARE - Administrator, Civil Service (Medical): No    Lack of Transportation (Non-Medical): No  Physical Activity: Not on file  Stress: Not on file  Social Connections: Unknown (02/21/2024)   Social Connection and Isolation Panel    Frequency of Communication with Friends and Family: Not on  file    Frequency of Social Gatherings with Friends and Family: Not on file    Attends Religious Services: Not on file    Active Member of Clubs or Organizations: Not on file    Attends Banker Meetings: Not on file    Marital Status: Never married  Intimate Partner Violence: Not At Risk (02/21/2024)   Humiliation, Afraid, Rape, and Kick questionnaire    Fear of Current or Ex-Partner: No    Emotionally Abused: No    Physically Abused: No    Sexually Abused: No    Family History: Family History  Problem Relation Age of Onset   Hypertension Mother     Current Medications:  Current Outpatient Medications:    aspirin  81 MG EC tablet, Take 1 tablet (81 mg total) by mouth daily with breakfast., Disp: 30 tablet, Rfl: 12   atorvastatin  (LIPITOR ) 40 MG tablet, Take 1 tablet (40 mg total) by mouth at bedtime., Disp: , Rfl:    carvedilol  (COREG ) 25 MG tablet, Take 1 tablet (25 mg total) by mouth 2 (two) times daily with a meal., Disp: , Rfl:    ferrous gluconate  (FERGON) 324 MG tablet, Take 324 mg by mouth daily., Disp: , Rfl:    fluticasone -salmeterol (ADVAIR) 500-50 MCG/ACT AEPB, Inhale 1 puff into the lungs in the morning and at bedtime., Disp: , Rfl:    gabapentin  (NEURONTIN ) 600 MG tablet, Take by mouth., Disp: , Rfl:    hydrALAZINE  (APRESOLINE ) 100 MG tablet, Take 1 tablet (100 mg total) by mouth 2 (two) times daily., Disp: , Rfl:    HYDROcodone -acetaminophen  (NORCO/VICODIN) 5-325 MG tablet, Take by mouth., Disp: , Rfl:    insulin  aspart (NOVOLOG  FLEXPEN) 100 UNIT/ML FlexPen, 0-15 Units, Subcutaneous, 3 times daily with meals CBG < 70: implement hypoglycemia protocol-call [facility] CBG 70 - 120: 0 units CBG 121 - 150: 2 units CBG 151 - 200: 3 units CBG 201 - 250: 5 units CBG 251 -  300: 8 units CBG 301 - 350: 11 units CBG 351 - 400: 15 units CBG > 400:, Disp: 15 mL, Rfl: 0   lamoTRIgine (LAMICTAL) 25 MG tablet, Take by mouth., Disp: , Rfl:    levothyroxine  (SYNTHROID ) 100 MCG tablet,  Take 100 mcg by mouth daily., Disp: , Rfl:    lubiprostone  (AMITIZA ) 8 MCG capsule, Take 1 capsule (8 mcg total) by mouth 2 (two) times daily with a meal., Disp: 60 capsule, Rfl: 5   Luliconazole 1 % CREA, Apply topically., Disp: , Rfl:    metolazone  (ZAROXOLYN ) 2.5 MG tablet, Take 1 tablet (2.5 mg total) by mouth once a week. On Wednesdays, Disp: , Rfl:    omeprazole  (PRILOSEC) 20 MG capsule, Take 20 mg by mouth daily., Disp: , Rfl:    polyvinyl alcohol  (LIQUIFILM TEARS) 1.4 % ophthalmic solution, Place 1 drop into both eyes daily., Disp: , Rfl:    potassium chloride  (KLOR-CON ) 10 MEQ tablet, Take 10 mEq by mouth daily., Disp: , Rfl:    sodium bicarbonate  325 MG tablet, Take 325 mg by mouth 2 (two) times daily., Disp: , Rfl:    tamsulosin  (FLOMAX ) 0.4 MG CAPS capsule, Take 0.8 mg by mouth every evening., Disp: , Rfl:    thiamine  (VITAMIN B-1) 100 MG tablet, Take 1 tablet (100 mg total) by mouth daily., Disp: , Rfl:    torsemide  (DEMADEX ) 20 MG tablet, Take 2 tablets (40 mg total) by mouth 2 (two) times daily., Disp: , Rfl:    traZODone  (DESYREL ) 100 MG tablet, Take 100 mg by mouth at bedtime., Disp: , Rfl:    Allergies: Allergies  Allergen Reactions   Ceftriaxone  Anaphylaxis    Previously tolerated at [facility] during multiple admissions. Patient reports SOB reaction in [place], Summer 2024. Now reporting SOB reaction at [facility] 02/21/24. No swelling, or skin reaction. Satted well on baseline oxygen. Does not wish to receive Ceftriaxone  in the future.   Ace Inhibitors Swelling and Cough   Chlorhexidine     Haloperidol  And Related     Do NOT give anti-psychotics due to risk of  Torsades and QT prolongation (per Dr. Trude request)   Other Itching    [name]   Pork Allergy Other (See Comments)    Not listed in [facility]   Shellfish Allergy     Listed on MAR   Penicillins Itching and Rash    Tolerated ancef  (12-17-22)    REVIEW OF SYSTEMS:   Review of Systems  Constitutional:   Negative for chills, fatigue and fever.  HENT:   Negative for lump/mass, mouth sores, nosebleeds, sore throat and trouble swallowing.   Eyes:  Negative for eye problems.  Respiratory:  Positive for shortness of breath. Negative for cough.   Cardiovascular:  Positive for chest pain. Negative for leg swelling and palpitations.  Gastrointestinal:  Positive for constipation, diarrhea and nausea. Negative for abdominal pain and vomiting.  Genitourinary:  Negative for bladder incontinence, difficulty urinating, dysuria, frequency, hematuria and nocturia.   Musculoskeletal:  Negative for arthralgias, back pain, flank pain, myalgias and neck pain.  Skin:  Negative for itching and rash.  Neurological:  Positive for headaches (occasional) and numbness. Negative for dizziness.  Hematological:  Does not bruise/bleed easily.  Psychiatric/Behavioral:  Negative for depression, sleep disturbance and suicidal ideas. The patient is not nervous/anxious.   All other systems reviewed and are negative.    VITALS:   Blood pressure 126/62, pulse (!) 55, temperature 97.8 F (36.6 C), temperature source Oral,  resp. rate 16, SpO2 100%.  Wt Readings from Last 3 Encounters:  03/16/24 235 lb (106.6 kg)  03/11/24 235 lb (106.6 kg)  02/20/24 234 lb (106.1 kg)    There is no height or weight on file to calculate BMI.  Performance status (ECOG): 2 - Symptomatic, <50% confined to bed  PHYSICAL EXAM:   Physical Exam Vitals and nursing note reviewed. Exam conducted with a chaperone present.  Constitutional:      Appearance: Normal appearance.  Cardiovascular:     Rate and Rhythm: Normal rate and regular rhythm.     Pulses: Normal pulses.     Heart sounds: Normal heart sounds.  Pulmonary:     Effort: Pulmonary effort is normal.     Breath sounds: Normal breath sounds.  Abdominal:     Palpations: Abdomen is soft. There is no hepatomegaly, splenomegaly or mass.     Tenderness: There is no abdominal tenderness.   Musculoskeletal:     Right lower leg: No edema.     Left lower leg: No edema.  Lymphadenopathy:     Cervical: No cervical adenopathy.     Right cervical: No superficial, deep or posterior cervical adenopathy.    Left cervical: No superficial, deep or posterior cervical adenopathy.     Upper Body:     Right upper body: No supraclavicular or axillary adenopathy.     Left upper body: No supraclavicular or axillary adenopathy.  Neurological:     General: No focal deficit present.     Mental Status: He is alert and oriented to person, place, and time.  Psychiatric:        Mood and Affect: Mood normal.        Behavior: Behavior normal.     LABS:      Latest Ref Rng & Units 06/09/2024   10:46 AM 05/25/2024   11:43 AM 02/23/2024    3:46 AM  CBC  WBC 4.0 - 10.5 K/uL 8.2  8.6  10.8   Hemoglobin 13.0 - 17.0 g/dL 9.8  9.7  9.3   Hematocrit 39.0 - 52.0 % 30.0  30.6  27.7   Platelets 150 - 400 K/uL 204  287  230       Latest Ref Rng & Units 06/09/2024   10:46 AM 05/25/2024   11:43 AM 02/23/2024    3:46 AM  CMP  Glucose 70 - 99 mg/dL 846  879  835   BUN 8 - 23 mg/dL 71  71  77   Creatinine 0.61 - 1.24 mg/dL 7.66  7.25  7.32   Sodium 135 - 145 mmol/L 137  134  133   Potassium 3.5 - 5.1 mmol/L 3.8  3.8  4.0   Chloride 98 - 111 mmol/L 94  90  92   CO2 22 - 32 mmol/L 32  32  28   Calcium  8.9 - 10.3 mg/dL 9.3  9.7  8.3   Total Protein 6.5 - 8.1 g/dL 7.1     Total Bilirubin 0.0 - 1.2 mg/dL 0.9     Alkaline Phos 38 - 126 U/L 106     AST 15 - 41 U/L 19     ALT 0 - 44 U/L 11        Lab Results  Component Value Date   CEA1 2.8 12/10/2023   /  CEA  Date Value Ref Range Status  12/10/2023 2.8 0.0 - 4.7 ng/mL Final    Comment:    (NOTE)  Nonsmokers          <3.9                             Smokers             <5.6 Roche [employer]ostics Electrochemiluminescence Immunoassay (ECLIA) Values obtained with different assay methods or kits cannot be used  interchangeably.  Results cannot be interpreted as absolute evidence of the presence or absence of malignant disease. Performed At: [facility] [address] [place], [place] [id number] Jennette Shorter [facility] Ey:[phone]    No results found for: PSA1 No results found for: CAN199 No results found for: CAN125  No results found for: STEPHANY CARLOTA BENSON MARKEL EARLA JOANNIE, GAMS, MSPIKE, SPEI Lab Results  Component Value Date   TIBC 255 06/09/2024   TIBC 281 12/10/2023   TIBC 218 (L) 02/16/2023   FERRITIN 444 (H) 06/09/2024   FERRITIN 279 12/10/2023   FERRITIN 282 02/16/2023   IRONPCTSAT 27 06/09/2024   IRONPCTSAT 34 12/10/2023   IRONPCTSAT 7 (L) 02/16/2023   Lab Results  Component Value Date   LDH 266 (H) 12/24/2019   LDH 242 (H) 12/01/2019     STUDIES:   No results found.

## 2024-06-09 NOTE — Patient Instructions (Addendum)
 [facility] at [facility] Discharge Instructions   You were seen and examined today by Dr. Rogers.  He reviewed the results of your lab work which are normal/stable.   We will see you back in one year. We will repeat lab work at this visit.    Return as scheduled.    Thank you for choosing [facility] at [facility] to provide your oncology and hematology care.  To afford each patient quality time with our provider, please arrive at least 15 minutes before your scheduled appointment time.   If you have a lab appointment with the Cancer Center please come in thru the Main Entrance and check in at the main information desk.  You need to re-schedule your appointment should you arrive 10 or more minutes late.  We strive to give you quality time with our providers, and arriving late affects you and other patients whose appointments are after yours.  Also, if you no show three or more times for appointments you may be dismissed from the clinic at the providers discretion.     Again, thank you for choosing [facility].  Our hope is that these requests will decrease the amount of time that you wait before being seen by our physicians.       _____________________________________________________________  Should you have questions after your visit to [facility], please contact our office at [phone] and follow the prompts.  Our office hours are 8:00 a.m. and 4:30 p.m. Monday - Friday.  Please note that voicemails left after 4:00 p.m. may not be returned until the following business day.  We are closed weekends and major holidays.  You do have access to a nurse 24-7, just call the main number to the clinic [phone] and do not press any options, hold on the line and a nurse will answer the phone.    For prescription refill requests, have your pharmacy contact our office and allow 72 hours.    Due to Covid, you will need  to wear a mask upon entering the hospital. If you do not have a mask, a mask will be given to you at the Main Entrance upon arrival. For doctor visits, patients may have 1 support person age 74 or older with them. For treatment visits, patients can not have anyone with them due to social distancing guidelines and our immunocompromised population.

## 2024-06-10 LAB — CEA: CEA: 2.8 ng/mL (ref 0.0–4.7)

## 2024-06-16 ENCOUNTER — Encounter (HOSPITAL_COMMUNITY): Payer: Self-pay | Admitting: Hematology

## 2024-06-21 ENCOUNTER — Ambulatory Visit

## 2024-06-24 ENCOUNTER — Ambulatory Visit

## 2024-06-29 ENCOUNTER — Ambulatory Visit: Admitting: Gastroenterology

## 2024-06-29 ENCOUNTER — Encounter: Payer: Self-pay | Admitting: Gastroenterology

## 2024-06-29 VITALS — BP 128/66 | HR 79 | Temp 98.0°F | Ht 69.0 in | Wt 226.0 lb

## 2024-06-29 DIAGNOSIS — K5900 Constipation, unspecified: Secondary | ICD-10-CM | POA: Diagnosis not present

## 2024-06-29 DIAGNOSIS — R1310 Dysphagia, unspecified: Secondary | ICD-10-CM

## 2024-06-29 DIAGNOSIS — K219 Gastro-esophageal reflux disease without esophagitis: Secondary | ICD-10-CM

## 2024-06-29 MED ORDER — LUBIPROSTONE 24 MCG PO CAPS: 24.0000 ug | ORAL_CAPSULE | Freq: Two times a day (BID) | ORAL | 3 refills | Status: DC

## 2024-06-29 MED ORDER — LACTULOSE 10 GM/15ML PO SOLN: 10.0000 g | Freq: Two times a day (BID) | ORAL | 0 refills | Status: DC | PRN

## 2024-06-29 MED ORDER — PANTOPRAZOLE SODIUM 40 MG PO TBEC: 40.0000 mg | DELAYED_RELEASE_TABLET | Freq: Every day | ORAL | 3 refills | Status: AC

## 2024-06-29 NOTE — H&P (View-Only) (Signed)
 Gastroenterology Office Note     Primary Care Physician:  System, Provider Not In  Primary Gastroenterologist: Dr. Cindie    Chief Complaint   Chief Complaint  Patient presents with   Follow-up    Pt arrives for follow up. Pt having issues with IBS-C. Reflux is worsening. Concerned that his ulcer is coming back. Pt is constipated. Pt had BM 2 days ago-loose stool. Has been taking laxatives and ate prunes. No bowel movements yesterday. Nauseated at times.      History of Present Illness   Shane Chambers is a 63 y.o. male presenting today with a history of  rectal cancer diagnosed at initial screening colonoscopy in Sept 2022 without need for surgery or chemotherapy, followed by Oncology, with recent colonoscopy Aug 2024 as noted below. He will be due for 3 year surveillance in 2027.  Additional history including normocyctic anemia r/t CKD and relative IDA.    Aug 2024 with non-bleeding internal hemorrhoids, post-polypectomy rectal scar s/p biopsy and tattoo seen in rectum. One 7 mm cecal polyp. Tubular adenoma, negative recurrence on path. 3 year surveillance due.   When last seen, he was dealing with constipation. Had wanted to avoid Linzess .   We started him on Amitiza  8 mcg twice a day with food.  He noticed overall this helped, however he had to hold this sometimes if he was going out for appointments because he did not want to have to urgency while he was out.  Once he got back then he would have to take multiple laxatives in order to have a productive stool and get going again.  He says sometimes Amitiza  is not as effective and it is hard to tell what it would be effective.  It is difficult for him to get to the bathroom on time.  He still wants to avoid Linzess .  He will also take senna and MiraLAX  as needed along with the Amitiza  I 8 mcg p.o. twice daily.  He feels his GERD is worsening.  He is taking omeprazole  20 mg daily and he has been on this chronically.  He notes  occasional solid food dysphagia which is new.  He notes an intermittent nausea.  He is worried about gastric ulcers.   EGD 2022: 1 gastric polyp s/p biopsy, gastritis. Gastritis and no H.pylori.   Past Medical History:  Diagnosis Date   Acute on chronic combined systolic and diastolic CHF (congestive heart failure) (HCC) 03/05/2020   Acute on chronic heart failure with preserved ejection fraction (HFpEF) (HCC) 03/27/2023   Anemia    Arthritis    CAD (coronary artery disease)    a. s/p CABG in 03/2020 with LIMA-LAD, RIMA-PL, RA-[id number]-OM1   Cancer (HCC)    rectal   Cellulitis    COPD (chronic obstructive pulmonary disease) (HCC)    Depression    Diabetes mellitus without complication (HCC)    GERD (gastroesophageal reflux disease)    History of kidney stones    Hyperlipidemia 12/01/2019   Hypertension    Hypothyroidism    Ischemic cardiomyopathy    a. EF 20-25% by echo in 02/2020 b. at 40% by echo in 03/2020 c. EF normalized to 60-65% by echo in 04/2020   Myocardial infarction [facility])    Peripheral vascular disease (HCC)    Sleep apnea    Type 2 diabetes mellitus [facility])     Past Surgical History:  Procedure Laterality Date   AMPUTATION Right 10/05/2022   Procedure: AMPUTATION BELOW KNEE;  Surgeon:  Harden Jerona GAILS, MD;  Location: [facility] OR;  Service: Orthopedics;  Laterality: Right;   AMPUTATION Right 11/14/2022   Procedure: AMPUTATION BELOW KNEE REVISION; WASHOUT;  Surgeon: Jama Cordella MATSU, MD;  Location: [facility] ORS;  Service: Vascular;  Laterality: Right;   AMPUTATION Right 11/18/2022   Procedure: AMPUTATION BELOW KNEE REVISION AND CLOSURE;  Surgeon: Jama Cordella MATSU, MD;  Location: [facility] ORS;  Service: Vascular;  Laterality: Right;   APPLICATION OF WOUND VAC Right 10/05/2022   Procedure: APPLICATION OF WOUND VAC;  Surgeon: Harden Jerona GAILS, MD;  Location: [facility] OR;  Service: Orthopedics;  Laterality: Right;   BIOPSY  07/18/2021   Procedure: BIOPSY;  Surgeon: [name], DO;   [facility];  Service: Endoscopy;;   BIOPSY  01/09/2022   Procedure: BIOPSY;  Surgeon: [name], DO;  [facility];  Service: Endoscopy;;   BIOPSY  06/29/2023   Procedure: BIOPSY;  Surgeon: [name], DO;  [facility];  Service: Endoscopy;;   COLONOSCOPY WITH PROPOFOL  N/A 07/18/2021   Carver: 15 millimeter polyp removed from the sigmoid colon, 5 mm polyp removed from sigmoid colon.  Nonbleeding internal hemorrhoids.  Significant looping of the colon. sigmoid path showed invasive colonic adenocarcinoma involving tubular adenoma (invades to depth of 2mm, carcinoma 1mm from margin, no lymphovascular invasion, no poorly differentiated component.   COLONOSCOPY WITH PROPOFOL  N/A 06/29/2023   Procedure: COLONOSCOPY WITH PROPOFOL ;  Surgeon: [name], DO;  [facility];  Service: Endoscopy;  Laterality: N/A;  7:30 am, asa 3   CORONARY ARTERY BYPASS GRAFT N/A 03/13/2020   Procedure: CORONARY ARTERY BYPASS GRAFTING (CABG) times five using bilateral Internal mammary arteries and left radial artery;  Surgeon: German Bartlett PEDLAR, MD;  Location: [facility] OR;  Service: Open Heart Surgery;  Laterality: N/A;   DENTAL SURGERY     ESOPHAGOGASTRODUODENOSCOPY (EGD) WITH PROPOFOL  N/A 07/18/2021   Carver: 1 gastric polyp status post biopsy, gastritis. gastric bx with slight chronic inflammation and no H.pyori. GEJ polypectomy with mild inflammation only   FLEXIBLE SIGMOIDOSCOPY N/A 08/26/2021   Carver: Nonbleeding internal hemorrhoids.  15 mm ulcers from previous polypectomy found in the rectum.  No evidence of previous polyp.  Located 5 to 8 cm from anal verge.   FLEXIBLE SIGMOIDOSCOPY N/A 01/09/2022   Procedure: FLEXIBLE SIGMOIDOSCOPY;  Surgeon: [name], DO;  [facility];  Service: Endoscopy;  Laterality: N/A;   IR CYSTOSTOMY TUBE PLACEMENT/BLADDER ASPIRATION  03/11/2024   IRRIGATION AND DEBRIDEMENT ELBOW Left 02/20/2024   Procedure: LEFT  ELBOW IRRIGATION AND DEBRIDEMENT;  Surgeon: Germaine Redbird, MD;  Location: [facility] OR;  Service: Orthopedics;  Laterality: Left;   POLYPECTOMY  07/18/2021   Procedure: POLYPECTOMY INTESTINAL;  Surgeon: [name], DO;  [facility];  Service: Endoscopy;;   POLYPECTOMY  06/29/2023   Procedure: POLYPECTOMY INTESTINAL;  Surgeon: [name], DO;  [facility];  Service: Endoscopy;;   RADIAL ARTERY HARVEST Left 03/13/2020   Procedure: RADIAL ARTERY HARVEST,;  Surgeon: German Bartlett PEDLAR, MD;  Location: [facility] OR;  Service: Open Heart Surgery;  Laterality: Left;   RIGHT/LEFT HEART CATH AND CORONARY ANGIOGRAPHY N/A 03/07/2020   Procedure: RIGHT/LEFT HEART CATH AND CORONARY ANGIOGRAPHY;  Surgeon: Wonda Sharper, MD;  Location: [facility] INVASIVE CV LAB;  Service: Cardiovascular;  Laterality: N/A;   STUMP REVISION Right 12/17/2022   Procedure: RIGHT BELOW KNEE AMPUTATION REVISION;  Surgeon: Harden Jerona GAILS, MD;  Location: [facility] OR;  Service: Orthopedics;  Laterality: Right;   SUBMUCOSAL LIFTING INJECTION  01/09/2022   Procedure: SUBMUCOSAL LIFTING INJECTION;  Surgeon: [name], DO;  [facility];  Service: Endoscopy;;   SUBMUCOSAL TATTOO INJECTION  01/09/2022   Procedure: SUBMUCOSAL TATTOO INJECTION;  Surgeon: [name], DO;  [facility];  Service: Endoscopy;;   TEE WITHOUT CARDIOVERSION N/A 03/13/2020   Procedure: TRANSESOPHAGEAL ECHOCARDIOGRAM (TEE);  Surgeon: German Bartlett PEDLAR, MD;  Location[facility] OR;  Service: Open Heart Surgery;  Laterality: N/A;    Current Outpatient Medications  Medication Sig Dispense Refill   aspirin  81 MG EC tablet Take 1 tablet (81 mg total) by mouth daily with breakfast. 30 tablet 12   atorvastatin  (LIPITOR ) 40 MG tablet Take 1 tablet (40 mg total) by mouth at bedtime.     carvedilol  (COREG ) 25 MG tablet Take 1 tablet (25 mg total) by mouth 2 (two) times daily with a meal.     ferrous gluconate  (FERGON) 324 MG tablet Take 324  mg by mouth daily.     fluticasone -salmeterol (ADVAIR) 500-50 MCG/ACT AEPB Inhale 1 puff into the lungs in the morning and at bedtime.     gabapentin  (NEURONTIN ) 600 MG tablet Take 600 mg by mouth 2 (two) times daily.     hydrALAZINE  (APRESOLINE ) 100 MG tablet Take 1 tablet (100 mg total) by mouth 2 (two) times daily.     HYDROcodone -acetaminophen  (NORCO/VICODIN) 5-325 MG tablet Take by mouth.     insulin  aspart (NOVOLOG  FLEXPEN) 100 UNIT/ML FlexPen 0-15 Units, Subcutaneous, 3 times daily with meals CBG < 70: implement hypoglycemia protocol-call MD CBG 70 - 120: 0 units CBG 121 - 150: 2 units CBG 151 - 200: 3 units CBG 201 - 250: 5 units CBG 251 - 300: 8 units CBG 301 - 350: 11 units CBG 351 - 400: 15 units CBG > 400: 15 mL 0   lactulose  (CHRONULAC ) 10 GM/15ML solution Take 15 mLs (10 g total) by mouth 2 (two) times daily as needed for mild constipation. 473 mL 0   levothyroxine  (SYNTHROID ) 100 MCG tablet Take 100 mcg by mouth daily.     lubiprostone  (AMITIZA ) 24 MCG capsule Take 1 capsule (24 mcg total) by mouth 2 (two) times daily with a meal. 60 capsule 3   lubiprostone  (AMITIZA ) 8 MCG capsule Take 1 capsule (8 mcg total) by mouth 2 (two) times daily with a meal. 60 capsule 5   Luliconazole 1 % CREA Apply topically.     metolazone  (ZAROXOLYN ) 2.5 MG tablet Take 1 tablet (2.5 mg total) by mouth once a week. On Wednesdays     omeprazole  (PRILOSEC) 20 MG capsule Take 20 mg by mouth daily.     pantoprazole  (PROTONIX ) 40 MG tablet Take 1 tablet (40 mg total) by mouth daily. Before breakfast 90 tablet 3   polyvinyl alcohol  (LIQUIFILM TEARS) 1.4 % ophthalmic solution Place 1 drop into both eyes daily.     potassium chloride  (KLOR-CON ) 10 MEQ tablet Take 10 mEq by mouth daily.     sodium bicarbonate  325 MG tablet Take 325 mg by mouth 2 (two) times daily.     tamsulosin  (FLOMAX ) 0.4 MG CAPS capsule Take 0.8 mg by mouth every evening.     thiamine  (VITAMIN B-1) 100 MG tablet Take 1 tablet (100 mg  total) by mouth daily.     torsemide  (DEMADEX ) 20 MG tablet Take 2 tablets (40 mg total) by mouth 2 (two) times daily.     traZODone  (  DESYREL ) 100 MG tablet Take 200 mg by mouth at bedtime.     lamoTRIgine (LAMICTAL) 100 MG tablet Take 100 mg by mouth 2 (two) times daily.     No current facility-administered medications for this visit.    Allergies as of 06/29/2024 - Review Complete 06/29/2024  Allergen Reaction Noted   Ceftriaxone  Anaphylaxis 09/29/2023   Ace inhibitors Swelling and Cough 12/11/2017   Chlorhexidine   10/25/2023   Haloperidol  and related  10/14/2022   Other Itching 11/15/2019   Pork allergy Other (See Comments) 02/20/2024   Shellfish allergy  10/05/2022   Penicillins Itching and Rash 12/11/2017    Family History  Problem Relation Age of Onset   Hypertension Mother     Social History   Socioeconomic History   Marital status: Single    Spouse name: Not on file   Number of children: Not on file   Years of education: Not on file   Highest education level: Not on file  Occupational History   Not on file  Tobacco Use   Smoking status: Former    Current packs/day: 0.00    Types: Cigarettes    Quit date: 05/25/1995    Years since quitting: 29.1   Smokeless tobacco: Never  Vaping Use   Vaping status: Never Used  Substance and Sexual Activity   Alcohol  use: Not Currently    Comment: rarely   Drug use: No   Sexual activity: Not Currently  Other Topics Concern   Not on file  Social History Narrative   Not on file   Social Drivers of Health   Financial Resource Strain: Not on file  Food Insecurity: No Food Insecurity (02/21/2024)   Hunger Vital Sign    Worried About Running Out of Food in the Last Year: Never true    Ran Out of Food in the Last Year: Never true  Transportation Needs: No Transportation Needs (02/21/2024)   PRAPARE - Administrator, Civil Service (Medical): No    Lack of Transportation (Non-Medical): No  Physical Activity: Not  on file  Stress: Not on file  Social Connections: Unknown (02/21/2024)   Social Connection and Isolation Panel    Frequency of Communication with Friends and Family: Not on file    Frequency of Social Gatherings with Friends and Family: Not on file    Attends Religious Services: Not on file    Active Member of Clubs or Organizations: Not on file    Attends Banker Meetings: Not on file    Marital Status: Never married  Intimate Partner Violence: Not At Risk (02/21/2024)   Humiliation, Afraid, Rape, and Kick questionnaire    Fear of Current or Ex-Partner: No    Emotionally Abused: No    Physically Abused: No    Sexually Abused: No     Review of Systems   Gen: Denies any fever, chills, fatigue, weight loss, lack of appetite.  CV: Denies chest pain, heart palpitations, peripheral edema, syncope.  Resp: Denies shortness of breath at rest or with exertion. Denies wheezing or cough.  GI: see HPI GU : Denies urinary burning, urinary frequency, urinary hesitancy MS: Denies joint pain, muscle weakness, cramps, or limitation of movement.  Derm: Denies rash, itching, dry skin Psych: Denies depression, anxiety, memory loss, and confusion Heme: Denies bruising, bleeding, and enlarged lymph nodes.   Physical Exam   BP 128/66   Pulse 79   Temp 98 F (36.7 C)   Ht 5' 9 (1.753 m)  Wt 226 lb (102.5 kg) Comment: pt reported  BMI 33.37 kg/m  General:   Alert and oriented. Pleasant and cooperative. Well-nourished and well-developed.  Head:  Normocephalic and atraumatic. Eyes:  Without icterus Abdomen:  +BS, soft, non-tender and non-distended. No HSM noted. No guarding or rebound. No masses appreciated.  Rectal:  Deferred  Msk:  right BKA Neurologic:  Alert and  oriented x4;  grossly normal neurologically. Skin:  Intact without significant lesions or rashes. Psych:  Alert and cooperative. Normal mood and affect.   Assessment   Shane Chambers is a 63 y.o. male presenting  today with a history of rectal cancer diagnosed at initial screening colonoscopy in Sept 2022 without need for surgery or chemotherapy, followed by Oncology, with recent colonoscopy Aug 2024 and surveillance due in 2027, chronic constipation, returning today to discuss further constipation management.   Constipation is not ideally managed at this point, as he is dealing with overflow diarrhea at times after skipping Amitiza  dosing and taking laxatives. We will increase Amitiza  to 24 mcg po and can take once to twice a day, titrating for what is best. He also would prefer to have lactulose  on hand prn, as this has worked well for him in the past.  Noting new onset solid food dysphagia, intermittent nausea, GERD worsening. As he has been on omeprazole  20 mg chronically, we will change this to pantoprazole  and arrange EGD/dilation in near future.  Nausea could also be r/t delayed gastric emptying in setting of diabetes. We did discuss Amitiza  has a class side effect of nausea and to ensure taking this food. He prefers to avoid linzess .      PLAN    Stop omeprazole , start pantoprazole  40 mg daily Increase Amitiza  to 24 mcg po BID with food, lactulose  prn Proceed with upper endoscopy/dilation by Dr. Cindie in near future: the risks, benefits, and alternatives have been discussed with the patient in detail. The patient states understanding and desires to proceed.  Colonoscopy 2027 Return in 3-4 months   Therisa MICAEL Stager, PhD, [facility] Gastroenterology

## 2024-06-29 NOTE — Progress Notes (Signed)
 Gastroenterology Office Note     Primary Care Physician:  System, Provider Not In  Primary Gastroenterologist: Dr. Cindie    Chief Complaint   Chief Complaint  Patient presents with   Follow-up    Pt arrives for follow up. Pt having issues with IBS-C. Reflux is worsening. Concerned that his ulcer is coming back. Pt is constipated. Pt had BM 2 days ago-loose stool. Has been taking laxatives and ate prunes. No bowel movements yesterday. Nauseated at times.      History of Present Illness   Shane Chambers is a 63 y.o. male presenting today with a history of  rectal cancer diagnosed at initial screening colonoscopy in Sept 2022 without need for surgery or chemotherapy, followed by Oncology, with recent colonoscopy Aug 2024 as noted below. He will be due for 3 year surveillance in 2027.  Additional history including normocyctic anemia r/t CKD and relative IDA.    Aug 2024 with non-bleeding internal hemorrhoids, post-polypectomy rectal scar s/p biopsy and tattoo seen in rectum. One 7 mm cecal polyp. Tubular adenoma, negative recurrence on path. 3 year surveillance due.   When last seen, he was dealing with constipation. Had wanted to avoid Linzess .   We started him on Amitiza  8 mcg twice a day with food.  He noticed overall this helped, however he had to hold this sometimes if he was going out for appointments because he did not want to have to urgency while he was out.  Once he got back then he would have to take multiple laxatives in order to have a productive stool and get going again.  He says sometimes Amitiza  is not as effective and it is hard to tell what it would be effective.  It is difficult for him to get to the bathroom on time.  He still wants to avoid Linzess .  He will also take senna and MiraLAX  as needed along with the Amitiza  I 8 mcg p.o. twice daily.  He feels his GERD is worsening.  He is taking omeprazole  20 mg daily and he has been on this chronically.  He notes  occasional solid food dysphagia which is new.  He notes an intermittent nausea.  He is worried about gastric ulcers.   EGD 2022: 1 gastric polyp s/p biopsy, gastritis. Gastritis and no H.pylori.   Past Medical History:  Diagnosis Date   Acute on chronic combined systolic and diastolic CHF (congestive heart failure) (HCC) 03/05/2020   Acute on chronic heart failure with preserved ejection fraction (HFpEF) (HCC) 03/27/2023   Anemia    Arthritis    CAD (coronary artery disease)    a. s/p CABG in 03/2020 with LIMA-LAD, RIMA-PL, RA-[id number]-OM1   Cancer (HCC)    rectal   Cellulitis    COPD (chronic obstructive pulmonary disease) (HCC)    Depression    Diabetes mellitus without complication (HCC)    GERD (gastroesophageal reflux disease)    History of kidney stones    Hyperlipidemia 12/01/2019   Hypertension    Hypothyroidism    Ischemic cardiomyopathy    a. EF 20-25% by echo in 02/2020 b. at 40% by echo in 03/2020 c. EF normalized to 60-65% by echo in 04/2020   Myocardial infarction [facility])    Peripheral vascular disease (HCC)    Sleep apnea    Type 2 diabetes mellitus [facility])     Past Surgical History:  Procedure Laterality Date   AMPUTATION Right 10/05/2022   Procedure: AMPUTATION BELOW KNEE;  Surgeon:  Harden Jerona GAILS, MD;  Location: [facility] OR;  Service: Orthopedics;  Laterality: Right;   AMPUTATION Right 11/14/2022   Procedure: AMPUTATION BELOW KNEE REVISION; WASHOUT;  Surgeon: Jama Cordella MATSU, MD;  Location: [facility] ORS;  Service: Vascular;  Laterality: Right;   AMPUTATION Right 11/18/2022   Procedure: AMPUTATION BELOW KNEE REVISION AND CLOSURE;  Surgeon: Jama Cordella MATSU, MD;  Location: [facility] ORS;  Service: Vascular;  Laterality: Right;   APPLICATION OF WOUND VAC Right 10/05/2022   Procedure: APPLICATION OF WOUND VAC;  Surgeon: Harden Jerona GAILS, MD;  Location: [facility] OR;  Service: Orthopedics;  Laterality: Right;   BIOPSY  07/18/2021   Procedure: BIOPSY;  Surgeon: [name], DO;   [facility];  Service: Endoscopy;;   BIOPSY  01/09/2022   Procedure: BIOPSY;  Surgeon: [name], DO;  [facility];  Service: Endoscopy;;   BIOPSY  06/29/2023   Procedure: BIOPSY;  Surgeon: [name], DO;  [facility];  Service: Endoscopy;;   COLONOSCOPY WITH PROPOFOL  N/A 07/18/2021   Carver: 15 millimeter polyp removed from the sigmoid colon, 5 mm polyp removed from sigmoid colon.  Nonbleeding internal hemorrhoids.  Significant looping of the colon. sigmoid path showed invasive colonic adenocarcinoma involving tubular adenoma (invades to depth of 2mm, carcinoma 1mm from margin, no lymphovascular invasion, no poorly differentiated component.   COLONOSCOPY WITH PROPOFOL  N/A 06/29/2023   Procedure: COLONOSCOPY WITH PROPOFOL ;  Surgeon: [name], DO;  [facility];  Service: Endoscopy;  Laterality: N/A;  7:30 am, asa 3   CORONARY ARTERY BYPASS GRAFT N/A 03/13/2020   Procedure: CORONARY ARTERY BYPASS GRAFTING (CABG) times five using bilateral Internal mammary arteries and left radial artery;  Surgeon: German Bartlett PEDLAR, MD;  Location: [facility] OR;  Service: Open Heart Surgery;  Laterality: N/A;   DENTAL SURGERY     ESOPHAGOGASTRODUODENOSCOPY (EGD) WITH PROPOFOL  N/A 07/18/2021   Carver: 1 gastric polyp status post biopsy, gastritis. gastric bx with slight chronic inflammation and no H.pyori. GEJ polypectomy with mild inflammation only   FLEXIBLE SIGMOIDOSCOPY N/A 08/26/2021   Carver: Nonbleeding internal hemorrhoids.  15 mm ulcers from previous polypectomy found in the rectum.  No evidence of previous polyp.  Located 5 to 8 cm from anal verge.   FLEXIBLE SIGMOIDOSCOPY N/A 01/09/2022   Procedure: FLEXIBLE SIGMOIDOSCOPY;  Surgeon: [name], DO;  [facility];  Service: Endoscopy;  Laterality: N/A;   IR CYSTOSTOMY TUBE PLACEMENT/BLADDER ASPIRATION  03/11/2024   IRRIGATION AND DEBRIDEMENT ELBOW Left 02/20/2024   Procedure: LEFT  ELBOW IRRIGATION AND DEBRIDEMENT;  Surgeon: Germaine Redbird, MD;  Location: [facility] OR;  Service: Orthopedics;  Laterality: Left;   POLYPECTOMY  07/18/2021   Procedure: POLYPECTOMY INTESTINAL;  Surgeon: [name], DO;  [facility];  Service: Endoscopy;;   POLYPECTOMY  06/29/2023   Procedure: POLYPECTOMY INTESTINAL;  Surgeon: [name], DO;  [facility];  Service: Endoscopy;;   RADIAL ARTERY HARVEST Left 03/13/2020   Procedure: RADIAL ARTERY HARVEST,;  Surgeon: German Bartlett PEDLAR, MD;  Location: [facility] OR;  Service: Open Heart Surgery;  Laterality: Left;   RIGHT/LEFT HEART CATH AND CORONARY ANGIOGRAPHY N/A 03/07/2020   Procedure: RIGHT/LEFT HEART CATH AND CORONARY ANGIOGRAPHY;  Surgeon: Wonda Sharper, MD;  Location: [facility] INVASIVE CV LAB;  Service: Cardiovascular;  Laterality: N/A;   STUMP REVISION Right 12/17/2022   Procedure: RIGHT BELOW KNEE AMPUTATION REVISION;  Surgeon: Harden Jerona GAILS, MD;  Location: [facility] OR;  Service: Orthopedics;  Laterality: Right;   SUBMUCOSAL LIFTING INJECTION  01/09/2022   Procedure: SUBMUCOSAL LIFTING INJECTION;  Surgeon: [name], DO;  [facility];  Service: Endoscopy;;   SUBMUCOSAL TATTOO INJECTION  01/09/2022   Procedure: SUBMUCOSAL TATTOO INJECTION;  Surgeon: [name], DO;  [facility];  Service: Endoscopy;;   TEE WITHOUT CARDIOVERSION N/A 03/13/2020   Procedure: TRANSESOPHAGEAL ECHOCARDIOGRAM (TEE);  Surgeon: German Bartlett PEDLAR, MD;  Location[facility] OR;  Service: Open Heart Surgery;  Laterality: N/A;    Current Outpatient Medications  Medication Sig Dispense Refill   aspirin  81 MG EC tablet Take 1 tablet (81 mg total) by mouth daily with breakfast. 30 tablet 12   atorvastatin  (LIPITOR ) 40 MG tablet Take 1 tablet (40 mg total) by mouth at bedtime.     carvedilol  (COREG ) 25 MG tablet Take 1 tablet (25 mg total) by mouth 2 (two) times daily with a meal.     ferrous gluconate  (FERGON) 324 MG tablet Take 324  mg by mouth daily.     fluticasone -salmeterol (ADVAIR) 500-50 MCG/ACT AEPB Inhale 1 puff into the lungs in the morning and at bedtime.     gabapentin  (NEURONTIN ) 600 MG tablet Take 600 mg by mouth 2 (two) times daily.     hydrALAZINE  (APRESOLINE ) 100 MG tablet Take 1 tablet (100 mg total) by mouth 2 (two) times daily.     HYDROcodone -acetaminophen  (NORCO/VICODIN) 5-325 MG tablet Take by mouth.     insulin  aspart (NOVOLOG  FLEXPEN) 100 UNIT/ML FlexPen 0-15 Units, Subcutaneous, 3 times daily with meals CBG < 70: implement hypoglycemia protocol-call MD CBG 70 - 120: 0 units CBG 121 - 150: 2 units CBG 151 - 200: 3 units CBG 201 - 250: 5 units CBG 251 - 300: 8 units CBG 301 - 350: 11 units CBG 351 - 400: 15 units CBG > 400: 15 mL 0   lactulose  (CHRONULAC ) 10 GM/15ML solution Take 15 mLs (10 g total) by mouth 2 (two) times daily as needed for mild constipation. 473 mL 0   levothyroxine  (SYNTHROID ) 100 MCG tablet Take 100 mcg by mouth daily.     lubiprostone  (AMITIZA ) 24 MCG capsule Take 1 capsule (24 mcg total) by mouth 2 (two) times daily with a meal. 60 capsule 3   lubiprostone  (AMITIZA ) 8 MCG capsule Take 1 capsule (8 mcg total) by mouth 2 (two) times daily with a meal. 60 capsule 5   Luliconazole 1 % CREA Apply topically.     metolazone  (ZAROXOLYN ) 2.5 MG tablet Take 1 tablet (2.5 mg total) by mouth once a week. On Wednesdays     omeprazole  (PRILOSEC) 20 MG capsule Take 20 mg by mouth daily.     pantoprazole  (PROTONIX ) 40 MG tablet Take 1 tablet (40 mg total) by mouth daily. Before breakfast 90 tablet 3   polyvinyl alcohol  (LIQUIFILM TEARS) 1.4 % ophthalmic solution Place 1 drop into both eyes daily.     potassium chloride  (KLOR-CON ) 10 MEQ tablet Take 10 mEq by mouth daily.     sodium bicarbonate  325 MG tablet Take 325 mg by mouth 2 (two) times daily.     tamsulosin  (FLOMAX ) 0.4 MG CAPS capsule Take 0.8 mg by mouth every evening.     thiamine  (VITAMIN B-1) 100 MG tablet Take 1 tablet (100 mg  total) by mouth daily.     torsemide  (DEMADEX ) 20 MG tablet Take 2 tablets (40 mg total) by mouth 2 (two) times daily.     traZODone  (  DESYREL ) 100 MG tablet Take 200 mg by mouth at bedtime.     lamoTRIgine (LAMICTAL) 100 MG tablet Take 100 mg by mouth 2 (two) times daily.     No current facility-administered medications for this visit.    Allergies as of 06/29/2024 - Review Complete 06/29/2024  Allergen Reaction Noted   Ceftriaxone  Anaphylaxis 09/29/2023   Ace inhibitors Swelling and Cough 12/11/2017   Chlorhexidine   10/25/2023   Haloperidol  and related  10/14/2022   Other Itching 11/15/2019   Pork allergy Other (See Comments) 02/20/2024   Shellfish allergy  10/05/2022   Penicillins Itching and Rash 12/11/2017    Family History  Problem Relation Age of Onset   Hypertension Mother     Social History   Socioeconomic History   Marital status: Single    Spouse name: Not on file   Number of children: Not on file   Years of education: Not on file   Highest education level: Not on file  Occupational History   Not on file  Tobacco Use   Smoking status: Former    Current packs/day: 0.00    Types: Cigarettes    Quit date: 05/25/1995    Years since quitting: 29.1   Smokeless tobacco: Never  Vaping Use   Vaping status: Never Used  Substance and Sexual Activity   Alcohol  use: Not Currently    Comment: rarely   Drug use: No   Sexual activity: Not Currently  Other Topics Concern   Not on file  Social History Narrative   Not on file   Social Drivers of Health   Financial Resource Strain: Not on file  Food Insecurity: No Food Insecurity (02/21/2024)   Hunger Vital Sign    Worried About Running Out of Food in the Last Year: Never true    Ran Out of Food in the Last Year: Never true  Transportation Needs: No Transportation Needs (02/21/2024)   PRAPARE - Administrator, Civil Service (Medical): No    Lack of Transportation (Non-Medical): No  Physical Activity: Not  on file  Stress: Not on file  Social Connections: Unknown (02/21/2024)   Social Connection and Isolation Panel    Frequency of Communication with Friends and Family: Not on file    Frequency of Social Gatherings with Friends and Family: Not on file    Attends Religious Services: Not on file    Active Member of Clubs or Organizations: Not on file    Attends Banker Meetings: Not on file    Marital Status: Never married  Intimate Partner Violence: Not At Risk (02/21/2024)   Humiliation, Afraid, Rape, and Kick questionnaire    Fear of Current or Ex-Partner: No    Emotionally Abused: No    Physically Abused: No    Sexually Abused: No     Review of Systems   Gen: Denies any fever, chills, fatigue, weight loss, lack of appetite.  CV: Denies chest pain, heart palpitations, peripheral edema, syncope.  Resp: Denies shortness of breath at rest or with exertion. Denies wheezing or cough.  GI: see HPI GU : Denies urinary burning, urinary frequency, urinary hesitancy MS: Denies joint pain, muscle weakness, cramps, or limitation of movement.  Derm: Denies rash, itching, dry skin Psych: Denies depression, anxiety, memory loss, and confusion Heme: Denies bruising, bleeding, and enlarged lymph nodes.   Physical Exam   BP 128/66   Pulse 79   Temp 98 F (36.7 C)   Ht 5' 9 (1.753 m)  Wt 226 lb (102.5 kg) Comment: pt reported  BMI 33.37 kg/m  General:   Alert and oriented. Pleasant and cooperative. Well-nourished and well-developed.  Head:  Normocephalic and atraumatic. Eyes:  Without icterus Abdomen:  +BS, soft, non-tender and non-distended. No HSM noted. No guarding or rebound. No masses appreciated.  Rectal:  Deferred  Msk:  right BKA Neurologic:  Alert and  oriented x4;  grossly normal neurologically. Skin:  Intact without significant lesions or rashes. Psych:  Alert and cooperative. Normal mood and affect.   Assessment   Shane Chambers is a 63 y.o. male presenting  today with a history of rectal cancer diagnosed at initial screening colonoscopy in Sept 2022 without need for surgery or chemotherapy, followed by Oncology, with recent colonoscopy Aug 2024 and surveillance due in 2027, chronic constipation, returning today to discuss further constipation management.   Constipation is not ideally managed at this point, as he is dealing with overflow diarrhea at times after skipping Amitiza  dosing and taking laxatives. We will increase Amitiza  to 24 mcg po and can take once to twice a day, titrating for what is best. He also would prefer to have lactulose  on hand prn, as this has worked well for him in the past.  Noting new onset solid food dysphagia, intermittent nausea, GERD worsening. As he has been on omeprazole  20 mg chronically, we will change this to pantoprazole  and arrange EGD/dilation in near future.  Nausea could also be r/t delayed gastric emptying in setting of diabetes. We did discuss Amitiza  has a class side effect of nausea and to ensure taking this food. He prefers to avoid linzess .      PLAN    Stop omeprazole , start pantoprazole  40 mg daily Increase Amitiza  to 24 mcg po BID with food, lactulose  prn Proceed with upper endoscopy/dilation by Dr. Cindie in near future: the risks, benefits, and alternatives have been discussed with the patient in detail. The patient states understanding and desires to proceed.  Colonoscopy 2027 Return in 3-4 months   Therisa MICAEL Stager, PhD, [facility] Gastroenterology

## 2024-06-29 NOTE — Patient Instructions (Signed)
 We are arranging an upper endoscopy and dilation with Dr. Cindie in the near future.  I have changed you from omeprazole  to pantoprazole .  We will stop omeprazole  and start pantoprazole  once daily 30 minutes for breakfast.  Stop Amitiza  8 mcg twice a day.  Instead I have increased this to Amitiza  24 mcg, and you can take this up to twice a day.  You can start off taking this once a day and see how this does with you.  Make sure to take it with food to avoid nausea.  I have also sent in lactulose  to take up to twice a day as needed if you need additional help.  We will see you back in 3 to 4 months!  I enjoyed seeing you again today! I value our relationship and want to provide genuine, compassionate, and quality care. You may receive a survey regarding your visit with me, and I welcome your feedback! Thanks so much for taking the time to complete this. I look forward to seeing you again.      Therisa MICAEL Stager, PhD, ANP-BC [facility] Gastroenterology

## 2024-06-30 ENCOUNTER — Telehealth: Payer: Self-pay | Admitting: *Deleted

## 2024-06-30 NOTE — Telephone Encounter (Signed)
LMOVM to return call  EGD/ED w/Dr.Carver, asa 3

## 2024-07-01 ENCOUNTER — Ambulatory Visit (INDEPENDENT_AMBULATORY_CARE_PROVIDER_SITE_OTHER)

## 2024-07-01 ENCOUNTER — Ambulatory Visit: Attending: Cardiology | Admitting: Cardiology

## 2024-07-01 ENCOUNTER — Encounter: Payer: Self-pay | Admitting: Cardiology

## 2024-07-01 VITALS — BP 104/60 | HR 56 | Ht 69.0 in

## 2024-07-01 DIAGNOSIS — I5032 Chronic diastolic (congestive) heart failure: Secondary | ICD-10-CM | POA: Insufficient documentation

## 2024-07-01 DIAGNOSIS — E782 Mixed hyperlipidemia: Secondary | ICD-10-CM | POA: Insufficient documentation

## 2024-07-01 DIAGNOSIS — I2510 Atherosclerotic heart disease of native coronary artery without angina pectoris: Secondary | ICD-10-CM | POA: Diagnosis present

## 2024-07-01 DIAGNOSIS — R339 Retention of urine, unspecified: Secondary | ICD-10-CM

## 2024-07-01 DIAGNOSIS — N319 Neuromuscular dysfunction of bladder, unspecified: Secondary | ICD-10-CM

## 2024-07-01 NOTE — Progress Notes (Signed)
 Clinical Summary [name] is a 63 y.o.male seen today for follow up of the following medical problems.    1. CAD - s/p CABG in 03/2020 with LIMA-LAD, RIMA-PL, RA-D1-RI-OM1 - his CABG was in the setting of NSTEMI, committed to 1 year DAPT     - occasional right sided chest pain only. Chronic stable SOB     2. Chronic diastolic HF -  - 01/7977 Echo: LVEF 60-65%, mod LVH, grade II DDx, normal RV function - 07/2020 limited echo LVEF 55-60%, grade II DDx (unchanged) - some improvement in LE edema - weights at nursing home 279, slow trending down.     - no recent edema - some SOB, on home O2 2LC 24 hrs a day - compliant with meds   3 Hyperlipidemia - compliant with meds  - no recent labs   4. Cellulitis - admission Jan 2022 with sepsis, cellulitis   5. Rectal cancer - followed by oncology  6. 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 CKD  Past Medical History:  Diagnosis Date   Acute on chronic combined systolic and diastolic CHF (congestive heart failure) (HCC) 03/05/2020   Acute on chronic heart failure with preserved ejection fraction (HFpEF) (HCC) 03/27/2023   Anemia    Arthritis    CAD (coronary artery disease)    a. s/p CABG in 03/2020 with LIMA-LAD, RIMA-PL, RA-D1-RI-OM1   Cancer (HCC)    rectal   Cellulitis    COPD (chronic obstructive pulmonary disease) (HCC)    Depression    Diabetes mellitus without complication (HCC)    GERD (gastroesophageal reflux disease)    History of kidney stones    Hyperlipidemia 12/01/2019   Hypertension    Hypothyroidism    Ischemic cardiomyopathy    a. EF 20-25% by echo in 02/2020 b. at 40% by echo in 03/2020 c. EF normalized to 60-65% by echo in 04/2020   Myocardial infarction [facility])    Peripheral vascular disease (HCC)    Sleep apnea    Type 2 diabetes mellitus (HCC)      Allergies   Allergen Reactions   Ceftriaxone  Anaphylaxis    Previously tolerated at [facility] during multiple admissions. Patient reports SOB reaction in [place], Summer 2024. Now reporting SOB reaction at [facility] 02/21/24. No swelling, or skin reaction. Satted well on baseline oxygen. Does not wish to receive Ceftriaxone  in the future.   Ace Inhibitors Swelling and Cough   Chlorhexidine     Haloperidol  And Related     Do NOT give anti-psychotics due to risk of  Torsades and QT prolongation (per Dr. Trude Chambers)   Other Itching    [name]   Pork Allergy Other (See Comments)    Not listed in [facility]   Shellfish Allergy     Listed on MAR   Penicillins Itching and Rash    Tolerated ancef  (12-17-22)     Current Outpatient Medications  Medication Sig Dispense Refill   aspirin  81 MG EC tablet Take 1 tablet (81 mg total) by mouth daily with breakfast. 30 tablet 12   atorvastatin  (LIPITOR ) 40 MG tablet Take 1 tablet (40 mg total) by mouth at bedtime.     carvedilol  (COREG ) 25 MG tablet Take 1 tablet (25 mg total) by mouth 2 (two) times daily with a meal.     ferrous gluconate  (FERGON) 324  MG tablet Take 324 mg by mouth daily.     fluticasone -salmeterol (ADVAIR) 500-50 MCG/ACT AEPB Inhale 1 puff into the lungs in the morning and at bedtime.     gabapentin  (NEURONTIN ) 600 MG tablet Take by mouth.     hydrALAZINE  (APRESOLINE ) 100 MG tablet Take 1 tablet (100 mg total) by mouth 2 (two) times daily.     HYDROcodone -acetaminophen  (NORCO/VICODIN) 5-325 MG tablet Take by mouth.     insulin  aspart (NOVOLOG  FLEXPEN) 100 UNIT/ML FlexPen 0-15 Units, Subcutaneous, 3 times daily with meals CBG < 70: implement hypoglycemia protocol-call MD CBG 70 - 120: 0 units CBG 121 - 150: 2 units CBG 151 - 200: 3 units CBG 201 - 250: 5 units CBG 251 - 300: 8 units CBG 301 - 350: 11 units CBG 351 - 400: 15 units CBG > 400: 15 mL 0   lactulose  (CHRONULAC ) 10 GM/15ML solution Take 15 mLs (10 g total) by mouth 2 (two)  times daily as needed for mild constipation. 473 mL 0   lamoTRIgine (LAMICTAL) 25 MG tablet Take by mouth.     levothyroxine  (SYNTHROID ) 100 MCG tablet Take 100 mcg by mouth daily.     lubiprostone  (AMITIZA ) 24 MCG capsule Take 1 capsule (24 mcg total) by mouth 2 (two) times daily with a meal. 60 capsule 3   lubiprostone  (AMITIZA ) 8 MCG capsule Take 1 capsule (8 mcg total) by mouth 2 (two) times daily with a meal. 60 capsule 5   Luliconazole 1 % CREA Apply topically.     metolazone  (ZAROXOLYN ) 2.5 MG tablet Take 1 tablet (2.5 mg total) by mouth once a week. On Wednesdays     omeprazole  (PRILOSEC) 20 MG capsule Take 20 mg by mouth daily.     pantoprazole  (PROTONIX ) 40 MG tablet Take 1 tablet (40 mg total) by mouth daily. Before breakfast 90 tablet 3   polyvinyl alcohol  (LIQUIFILM TEARS) 1.4 % ophthalmic solution Place 1 drop into both eyes daily.     potassium chloride  (KLOR-CON ) 10 MEQ tablet Take 10 mEq by mouth daily.     sodium bicarbonate  325 MG tablet Take 325 mg by mouth 2 (two) times daily.     tamsulosin  (FLOMAX ) 0.4 MG CAPS capsule Take 0.8 mg by mouth every evening.     thiamine  (VITAMIN B-1) 100 MG tablet Take 1 tablet (100 mg total) by mouth daily.     torsemide  (DEMADEX ) 20 MG tablet Take 2 tablets (40 mg total) by mouth 2 (two) times daily.     traZODone  (DESYREL ) 100 MG tablet Take 100 mg by mouth at bedtime.     No current facility-administered medications for this visit.     Past Surgical History:  Procedure Laterality Date   AMPUTATION Right 10/05/2022   Procedure: AMPUTATION BELOW KNEE;  Surgeon: Shane Jerona GAILS, MD;  Location: [facility] OR;  Service: Orthopedics;  Laterality: Right;   AMPUTATION Right 11/14/2022   Procedure: AMPUTATION BELOW KNEE REVISION; WASHOUT;  Surgeon: Shane Cordella MATSU, MD;  Location: [facility] ORS;  Service: Vascular;  Laterality: Right;   AMPUTATION Right 11/18/2022   Procedure: AMPUTATION BELOW KNEE REVISION AND CLOSURE;  Surgeon: Shane Cordella MATSU, MD;   Location: [facility] ORS;  Service: Vascular;  Laterality: Right;   APPLICATION OF WOUND VAC Right 10/05/2022   Procedure: APPLICATION OF WOUND VAC;  Surgeon: Shane Jerona GAILS, MD;  Location: [facility] OR;  Service: Orthopedics;  Laterality: Right;   BIOPSY  07/18/2021   Procedure: BIOPSY;  Surgeon: [name], DO;  [facility];  Service: Endoscopy;;   BIOPSY  01/09/2022   Procedure: BIOPSY;  Surgeon: [name], DO;  [facility];  Service: Endoscopy;;   BIOPSY  06/29/2023   Procedure: BIOPSY;  Surgeon: [name], DO;  [facility];  Service: Endoscopy;;   COLONOSCOPY WITH PROPOFOL  N/A 07/18/2021   Carver: 15 millimeter polyp removed from the sigmoid colon, 5 mm polyp removed from sigmoid colon.  Nonbleeding internal hemorrhoids.  Significant looping of the colon. sigmoid path showed invasive colonic adenocarcinoma involving tubular adenoma (invades to depth of 2mm, carcinoma 1mm from margin, no lymphovascular invasion, no poorly differentiated component.   COLONOSCOPY WITH PROPOFOL  N/A 06/29/2023   Procedure: COLONOSCOPY WITH PROPOFOL ;  Surgeon: [name], DO;  [facility];  Service: Endoscopy;  Laterality: N/A;  7:30 am, asa 3   CORONARY ARTERY BYPASS GRAFT N/A 03/13/2020   Procedure: CORONARY ARTERY BYPASS GRAFTING (CABG) times five using bilateral Internal mammary arteries and left radial artery;  Surgeon: German Bartlett PEDLAR, MD;  Location: [facility] OR;  Service: Open Heart Surgery;  Laterality: N/A;   DENTAL SURGERY     ESOPHAGOGASTRODUODENOSCOPY (EGD) WITH PROPOFOL  N/A 07/18/2021   Carver: 1 gastric polyp status post biopsy, gastritis. gastric bx with slight chronic inflammation and no H.pyori. GEJ polypectomy with mild inflammation only   FLEXIBLE SIGMOIDOSCOPY N/A 08/26/2021   Carver: Nonbleeding internal hemorrhoids.  15 mm ulcers from previous polypectomy found in the rectum.  No evidence of previous polyp.  Located 5 to 8 cm from anal  verge.   FLEXIBLE SIGMOIDOSCOPY N/A 01/09/2022   Procedure: FLEXIBLE SIGMOIDOSCOPY;  Surgeon: [name], DO;  [facility];  Service: Endoscopy;  Laterality: N/A;   IR CYSTOSTOMY TUBE PLACEMENT/BLADDER ASPIRATION  03/11/2024   IRRIGATION AND DEBRIDEMENT ELBOW Left 02/20/2024   Procedure: LEFT ELBOW IRRIGATION AND DEBRIDEMENT;  Surgeon: Germaine Redbird, MD;  Location: [facility] OR;  Service: Orthopedics;  Laterality: Left;   POLYPECTOMY  07/18/2021   Procedure: POLYPECTOMY INTESTINAL;  Surgeon: [name], DO;  [facility];  Service: Endoscopy;;   POLYPECTOMY  06/29/2023   Procedure: POLYPECTOMY INTESTINAL;  Surgeon: [name], DO;  [facility];  Service: Endoscopy;;   RADIAL ARTERY HARVEST Left 03/13/2020   Procedure: RADIAL ARTERY HARVEST,;  Surgeon: German Bartlett PEDLAR, MD;  Location: [facility] OR;  Service: Open Heart Surgery;  Laterality: Left;   RIGHT/LEFT HEART CATH AND CORONARY ANGIOGRAPHY N/A 03/07/2020   Procedure: RIGHT/LEFT HEART CATH AND CORONARY ANGIOGRAPHY;  Surgeon: Wonda Sharper, MD;  Location: [facility] INVASIVE CV LAB;  Service: Cardiovascular;  Laterality: N/A;   STUMP REVISION Right 12/17/2022   Procedure: RIGHT BELOW KNEE AMPUTATION REVISION;  Surgeon: Shane Jerona GAILS, MD;  Location: [facility] OR;  Service: Orthopedics;  Laterality: Right;   SUBMUCOSAL LIFTING INJECTION  01/09/2022   Procedure: SUBMUCOSAL LIFTING INJECTION;  Surgeon: [name], DO;  [facility];  Service: Endoscopy;;   SUBMUCOSAL TATTOO INJECTION  01/09/2022   Procedure: SUBMUCOSAL TATTOO INJECTION;  Surgeon: [name], DO;  [facility];  Service: Endoscopy;;   TEE WITHOUT CARDIOVERSION N/A 03/13/2020   Procedure: TRANSESOPHAGEAL ECHOCARDIOGRAM (TEE);  Surgeon: German Bartlett PEDLAR, MD;  Location: [facility] OR;  Service: Open Heart Surgery;  Laterality: N/A;     Allergies  Allergen Reactions   Ceftriaxone  Anaphylaxis    Previously tolerated at [facility]  during multiple admissions. Patient reports SOB reaction in [place], [place]  2024. Now reporting SOB reaction at [facility] 02/21/24. No swelling, or skin reaction. Satted well on baseline oxygen. Does not wish to receive Ceftriaxone  in the future.   Ace Inhibitors Swelling and Cough   Chlorhexidine     Haloperidol  And Related     Do NOT give anti-psychotics due to risk of  Torsades and QT prolongation (per Dr. Trude Chambers)   Other Itching    [name]   Pork Allergy Other (See Comments)    Not listed in [facility]   Shellfish Allergy     Listed on MAR   Penicillins Itching and Rash    Tolerated ancef  (12-17-22)      Family History  Problem Relation Age of Onset   Hypertension Mother      Social History [name] reports that he quit smoking about 29 years ago. His smoking use included cigarettes. He has never used smokeless tobacco. [name] reports that he does not currently use alcohol .    Physical Examination Today's Vitals   07/01/24 1051  BP: 104/60  Pulse: (!) 56  SpO2: 98%  Height: 5' 9 (1.753 m)   Body mass index is 33.37 kg/m.  Gen: resting comfortably, no acute distress HEENT: no scleral icterus, pupils equal round and reactive, no palptable cervical adenopathy,  CV: RRR, 2/6 systolic murmur rusb, no jvd Resp: Clear to auscultation bilaterally GI: abdomen is soft, non-tender, non-distended, normal bowel sounds, no hepatosplenomegaly MSK: extremities are warm, no edema.  Skin: warm, no rash Neuro:  no focal deficits Psych: appropriate affect   Diagnostic Studies     Assessment and Plan   1. CAD -no symptoms, continue current meds   2. Chronic diastolic HF - euvolemic without symptoms, continue current meds   3. Hyperlipidemia - we will repeat lipid panel   4.HTN He ist at goal   F/u 6 months   Dorn PHEBE Ross, M.D

## 2024-07-01 NOTE — Progress Notes (Addendum)
 Suprapubic Cath Change  Patient is present today for a suprapubic catheter change due to urinary retention.  10 ml of water  was drained from the balloon, a 20 FR foley cath was removed from the tract with out difficulty.  Suprapubic catheter site was cleaned and prepped in a sterile fashion with [id number]  A 20 FR foley cath was replaced into the tract no complications were noted.75 ML Flush return was noted, urine Clear yellow in color . 10 ml of sterile water  was inflated into the balloon and a night bag was attached for drainage.  Patient tolerated well. A night bag was given to patient and proper instruction was given on how to switch bags.    Performed by: Carlos, CMA  Follow up: Keep Monthly cath change

## 2024-07-01 NOTE — Patient Instructions (Addendum)
 Medication Instructions:  Your physician recommends that you continue on your current medications as directed. Please refer to the Current Medication list given to you today.   Labwork: Fasting Lipid Panel to be completed fasting at [facility]  Testing/Procedures: None  Follow-Up: Your physician recommends that you schedule a follow-up appointment in: 6 months  Any Other Special Instructions Will Be Listed Below (If Applicable). Thank you for choosing [facility]!     If you need a refill on your cardiac medications before your next appointment, please call your pharmacy.

## 2024-07-04 ENCOUNTER — Encounter: Payer: Self-pay | Admitting: *Deleted

## 2024-07-04 NOTE — Telephone Encounter (Signed)
 Pt has been scheduled for 07/22/24. Instructions faxed to [facility] at [phone] attn: Maryellen

## 2024-07-07 ENCOUNTER — Ambulatory Visit: Admitting: Family Medicine

## 2024-07-20 ENCOUNTER — Other Ambulatory Visit (HOSPITAL_COMMUNITY)

## 2024-07-21 ENCOUNTER — Encounter (HOSPITAL_COMMUNITY)
Admission: RE | Admit: 2024-07-21 | Discharge: 2024-07-21 | Disposition: A | Discharge status: Home / Self Care | Source: Ambulatory Visit | Attending: Internal Medicine | Admitting: Internal Medicine

## 2024-07-21 DIAGNOSIS — D631 Anemia in chronic kidney disease: Secondary | ICD-10-CM | POA: Insufficient documentation

## 2024-07-21 DIAGNOSIS — N189 Chronic kidney disease, unspecified: Secondary | ICD-10-CM | POA: Diagnosis not present

## 2024-07-21 DIAGNOSIS — Z01818 Encounter for other preprocedural examination: Secondary | ICD-10-CM | POA: Diagnosis present

## 2024-07-21 DIAGNOSIS — K529 Noninfective gastroenteritis and colitis, unspecified: Secondary | ICD-10-CM

## 2024-07-21 DIAGNOSIS — E875 Hyperkalemia: Secondary | ICD-10-CM | POA: Diagnosis not present

## 2024-07-21 DIAGNOSIS — Z01812 Encounter for preprocedural laboratory examination: Secondary | ICD-10-CM | POA: Insufficient documentation

## 2024-07-21 LAB — CBC WITH DIFFERENTIAL/PLATELET
Abs Immature Granulocytes: 0.1 K/uL — ABNORMAL HIGH (ref 0.00–0.07)
Basophils Absolute: 0 K/uL (ref 0.0–0.1)
Basophils Relative: 0 %
Eosinophils Absolute: 0.3 K/uL (ref 0.0–0.5)
Eosinophils Relative: 5 %
HCT: 30.7 % — ABNORMAL LOW (ref 39.0–52.0)
Hemoglobin: 9.9 g/dL — ABNORMAL LOW (ref 13.0–17.0)
Immature Granulocytes: 1 %
Lymphocytes Relative: 16 %
Lymphs Abs: 1.2 K/uL (ref 0.7–4.0)
MCH: 30.6 pg (ref 26.0–34.0)
MCHC: 32.2 g/dL (ref 30.0–36.0)
MCV: 94.8 fL (ref 80.0–100.0)
Monocytes Absolute: 0.7 K/uL (ref 0.1–1.0)
Monocytes Relative: 9 %
Neutro Abs: 5 K/uL (ref 1.7–7.7)
Neutrophils Relative %: 69 %
Platelets: 223 K/uL (ref 150–400)
RBC: 3.24 MIL/uL — ABNORMAL LOW (ref 4.22–5.81)
RDW: 13.2 % (ref 11.5–15.5)
WBC: 7.3 K/uL (ref 4.0–10.5)
nRBC: 0 % (ref 0.0–0.2)

## 2024-07-21 LAB — COMPREHENSIVE METABOLIC PANEL WITH GFR
ALT: 13 U/L (ref 0–44)
AST: 20 U/L (ref 15–41)
Albumin: 3.9 g/dL (ref 3.5–5.0)
Alkaline Phosphatase: 88 U/L (ref 38–126)
Anion gap: 17 — ABNORMAL HIGH (ref 5–15)
BUN: 113 mg/dL — ABNORMAL HIGH (ref 8–23)
CO2: 30 mmol/L (ref 22–32)
Calcium: 9.7 mg/dL (ref 8.9–10.3)
Chloride: 88 mmol/L — ABNORMAL LOW (ref 98–111)
Creatinine, Ser: 3.26 mg/dL — ABNORMAL HIGH (ref 0.61–1.24)
GFR, Estimated: 21 mL/min — ABNORMAL LOW (ref >60)
Glucose, Bld: 133 mg/dL — ABNORMAL HIGH (ref 70–99)
Potassium: 3.9 mmol/L (ref 3.5–5.1)
Sodium: 135 mmol/L (ref 135–145)
Total Bilirubin: 0.6 mg/dL (ref 0.0–1.2)
Total Protein: 7.7 g/dL (ref 6.5–8.1)

## 2024-07-21 NOTE — Patient Instructions (Addendum)
 20    Your procedure is scheduled on: (/10/2024  Report to [facility] Main Entrance at   7:15  AM.  Call this number if you have problems the morning of surgery: [phone]   Remember:   Follow instructions on letter from office regarding when to stop eating and drinking        No Smoking the day of procedure      Take these medicines the morning of surgery with A SIP OF WATER : Carvedilol , gabapentin , hydralazine , levothyroxine , Lamital, pantoprazole  and flomax    Do not wear jewelry, make-up or nail polish.  Do not wear lotions, powders, or perfumes. You may wear deodorant.                Do not bring valuables to the hospital.  Contacts, dentures or bridgework may not be worn into surgery.  Leave suitcase in the car. After surgery it may be brought to your room.  For patients admitted to the hospital, checkout time is 11:00 AM the day of discharge.   Patients discharged the day of surgery will not be allowed to drive home. Upper Endoscopy, Adult Upper endoscopy is a procedure to look inside the upper GI (gastrointestinal) tract. The upper GI tract is made up of: The part of the body that moves food from your mouth to your stomach (esophagus). The stomach. The first part of your small intestine (duodenum). This procedure is also called esophagogastroduodenoscopy (EGD) or gastroscopy. In this procedure, your health care provider passes a thin, flexible tube (endoscope) through your mouth and down your esophagus into your stomach. A small camera is attached to the end of the tube. Images from the camera appear on a monitor in the exam room. During this procedure, your health care provider may also remove a small piece of tissue to be sent to a lab and examined under a microscope (biopsy). Your health care provider may do an upper endoscopy to diagnose cancers of the upper GI tract. You may also have this procedure to find the cause of other conditions, such as: Stomach  pain. Heartburn. Pain or problems when swallowing. Nausea and vomiting. Stomach bleeding. Stomach ulcers. Tell a health care provider about: Any allergies you have. All medicines you are taking, including vitamins, herbs, eye drops, creams, and over-the-counter medicines. Any problems you or family members have had with anesthetic medicines. Any blood disorders you have. Any surgeries you have had. Any medical conditions you have. Whether you are pregnant or may be pregnant. What are the risks? Generally, this is a safe procedure. However, problems may occur, including: Infection. Bleeding. Allergic reactions to medicines. A tear or hole (perforation) in the esophagus, stomach, or duodenum. What happens before the procedure? Staying hydrated Follow instructions from your health care provider about hydration, which may include: Up to 4 hours before the procedure - you may continue to drink clear liquids, such as water , clear fruit juice, black coffee, and plain tea.   Medicines Ask your health care provider about: Changing or stopping your regular medicines. This is especially important if you are taking diabetes medicines or blood thinners. Taking medicines such as aspirin  and ibuprofen. These medicines can thin your blood. Do not take these medicines unless your health care provider tells you to take them. Taking over-the-counter medicines, vitamins, herbs, and supplements. General instructions Plan to have someone take you home from the hospital or clinic. If you will be going home right after the procedure, plan to have someone with you  for 24 hours. Ask your health care provider what steps will be taken to help prevent infection. What happens during the procedure?  An IV will be inserted into one of your veins. You may be given one or more of the following: A medicine to help you relax (sedative). A medicine to numb the throat (local anesthetic). You will lie on your left  side on an exam table. Your health care provider will pass the endoscope through your mouth and down your esophagus. Your health care provider will use the scope to check the inside of your esophagus, stomach, and duodenum. Biopsies may be taken. The endoscope will be removed. The procedure may vary among health care providers and hospitals. What happens after the procedure? Your blood pressure, heart rate, breathing rate, and blood oxygen level will be monitored until you leave the hospital or clinic. Do not drive for 24 hours if you were given a sedative during your procedure. When your throat is no longer numb, you may be given some fluids to drink. It is up to you to get the results of your procedure. Ask your health care provider, or the department that is doing the procedure, when your results will be ready. Summary Upper endoscopy is a procedure to look inside the upper GI tract. During the procedure, an IV will be inserted into one of your veins. You may be given a medicine to help you relax. A medicine will be used to numb your throat. The endoscope will be passed through your mouth and down your esophagus. This information is not intended to replace advice given to you by your health care provider. Make sure you discuss any questions you have with your health care provider. Document Revised: 04/21/2018 Document Reviewed: 03/29/2018 Elsevier Patient Education  2020 [employer] Inc.                                                                                                                                      [facility] After  Please read the instructions outlined below and refer to this sheet in the next few weeks. These discharge instructions provide you with general information on caring for yourself after you leave the hospital. Your doctor may also give you specific instructions. While your treatment has been planned according to the most current medical practices available,  unavoidable complications occasionally occur. If you have any problems or questions after discharge, please call your doctor. HOME CARE INSTRUCTIONS Activity You may resume your regular activity but move at a slower pace for the next 24 hours.  Take frequent rest periods for the next 24 hours.  Walking will help expel (get rid of) the air and reduce the bloated feeling in your abdomen.  No driving for 24 hours (because of the anesthesia (medicine) used during the test).  You may shower.  Do not sign any important legal documents or operate any machinery for 24 hours (  because of the anesthesia used during the test).  Nutrition Drink plenty of fluids.  You may resume your normal diet.  Begin with a light meal and progress to your normal diet.  Avoid alcoholic beverages for 24 hours or as instructed by your caregiver.  Medications You may resume your normal medications unless your caregiver tells you otherwise. What you can expect today You may experience abdominal discomfort such as a feeling of fullness or gas pains.  You may experience a sore throat for 2 to 3 days. This is normal. Gargling with salt water  may help this.  Follow-up Your doctor will discuss the results of your test with you. SEEK IMMEDIATE MEDICAL CARE IF: You have excessive nausea (feeling sick to your stomach) and/or vomiting.  You have severe abdominal pain and distention (swelling).  You have trouble swallowing.  You have a temperature over 100 F (37.8 C).  You have rectal bleeding or vomiting of blood.  Document Released: 06/10/2004 Document Revised: 10/16/2011 Document Reviewed: 12/22/2007

## 2024-07-22 ENCOUNTER — Ambulatory Visit (HOSPITAL_COMMUNITY): Admitting: Certified Registered"

## 2024-07-22 ENCOUNTER — Encounter (HOSPITAL_COMMUNITY): Payer: Self-pay | Admitting: Oncology

## 2024-07-22 ENCOUNTER — Ambulatory Visit (HOSPITAL_COMMUNITY)
Admission: RE | Admit: 2024-07-22 | Discharge: 2024-07-22 | Disposition: A | Discharge status: Home / Self Care | Attending: Internal Medicine | Admitting: Internal Medicine

## 2024-07-22 ENCOUNTER — Other Ambulatory Visit: Payer: Self-pay

## 2024-07-22 ENCOUNTER — Encounter (HOSPITAL_COMMUNITY)
Admission: RE | Disposition: A | Discharge status: Home / Self Care | Payer: Self-pay | Source: Home / Self Care | Attending: Internal Medicine

## 2024-07-22 ENCOUNTER — Encounter (HOSPITAL_COMMUNITY): Payer: Self-pay | Admitting: Internal Medicine

## 2024-07-22 DIAGNOSIS — I130 Hypertensive heart and chronic kidney disease with heart failure and stage 1 through stage 4 chronic kidney disease, or unspecified chronic kidney disease: Secondary | ICD-10-CM | POA: Diagnosis not present

## 2024-07-22 DIAGNOSIS — E039 Hypothyroidism, unspecified: Secondary | ICD-10-CM | POA: Diagnosis not present

## 2024-07-22 DIAGNOSIS — I252 Old myocardial infarction: Secondary | ICD-10-CM | POA: Diagnosis not present

## 2024-07-22 DIAGNOSIS — K648 Other hemorrhoids: Secondary | ICD-10-CM | POA: Diagnosis not present

## 2024-07-22 DIAGNOSIS — Z794 Long term (current) use of insulin: Secondary | ICD-10-CM | POA: Insufficient documentation

## 2024-07-22 DIAGNOSIS — K581 Irritable bowel syndrome with constipation: Secondary | ICD-10-CM | POA: Insufficient documentation

## 2024-07-22 DIAGNOSIS — I2510 Atherosclerotic heart disease of native coronary artery without angina pectoris: Secondary | ICD-10-CM | POA: Insufficient documentation

## 2024-07-22 DIAGNOSIS — N189 Chronic kidney disease, unspecified: Secondary | ICD-10-CM | POA: Insufficient documentation

## 2024-07-22 DIAGNOSIS — K2970 Gastritis, unspecified, without bleeding: Secondary | ICD-10-CM | POA: Insufficient documentation

## 2024-07-22 DIAGNOSIS — D509 Iron deficiency anemia, unspecified: Secondary | ICD-10-CM | POA: Insufficient documentation

## 2024-07-22 DIAGNOSIS — K2289 Other specified disease of esophagus: Secondary | ICD-10-CM

## 2024-07-22 DIAGNOSIS — Z79899 Other long term (current) drug therapy: Secondary | ICD-10-CM | POA: Insufficient documentation

## 2024-07-22 DIAGNOSIS — J449 Chronic obstructive pulmonary disease, unspecified: Secondary | ICD-10-CM | POA: Diagnosis not present

## 2024-07-22 DIAGNOSIS — R1310 Dysphagia, unspecified: Secondary | ICD-10-CM | POA: Insufficient documentation

## 2024-07-22 DIAGNOSIS — E1151 Type 2 diabetes mellitus with diabetic peripheral angiopathy without gangrene: Secondary | ICD-10-CM | POA: Insufficient documentation

## 2024-07-22 DIAGNOSIS — R12 Heartburn: Secondary | ICD-10-CM | POA: Diagnosis not present

## 2024-07-22 DIAGNOSIS — K3189 Other diseases of stomach and duodenum: Secondary | ICD-10-CM | POA: Diagnosis not present

## 2024-07-22 DIAGNOSIS — D631 Anemia in chronic kidney disease: Secondary | ICD-10-CM | POA: Insufficient documentation

## 2024-07-22 DIAGNOSIS — I5042 Chronic combined systolic (congestive) and diastolic (congestive) heart failure: Secondary | ICD-10-CM | POA: Insufficient documentation

## 2024-07-22 DIAGNOSIS — Z87891 Personal history of nicotine dependence: Secondary | ICD-10-CM | POA: Insufficient documentation

## 2024-07-22 DIAGNOSIS — E1122 Type 2 diabetes mellitus with diabetic chronic kidney disease: Secondary | ICD-10-CM | POA: Insufficient documentation

## 2024-07-22 DIAGNOSIS — Z85048 Personal history of other malignant neoplasm of rectum, rectosigmoid junction, and anus: Secondary | ICD-10-CM | POA: Diagnosis not present

## 2024-07-22 DIAGNOSIS — K219 Gastro-esophageal reflux disease without esophagitis: Secondary | ICD-10-CM | POA: Diagnosis not present

## 2024-07-22 DIAGNOSIS — G4730 Sleep apnea, unspecified: Secondary | ICD-10-CM | POA: Diagnosis not present

## 2024-07-22 LAB — KOH PREP

## 2024-07-22 LAB — GLUCOSE, CAPILLARY: Glucose-Capillary: 131 mg/dL — ABNORMAL HIGH (ref 70–99)

## 2024-07-22 SURGERY — EGD (ESOPHAGOGASTRODUODENOSCOPY)
Anesthesia: General

## 2024-07-22 MED ORDER — LIDOCAINE HCL (CARDIAC) PF 100 MG/5ML IV SOSY
PREFILLED_SYRINGE | INTRAVENOUS | Status: DC | PRN
  Administered 2024-07-22: 50 mg via INTRAVENOUS

## 2024-07-22 MED ORDER — LACTATED RINGERS IV SOLN: INTRAVENOUS | Status: DC | PRN

## 2024-07-22 MED ORDER — PHENYLEPHRINE HCL (PRESSORS) 10 MG/ML IV SOLN
INTRAVENOUS | Status: DC | PRN
  Administered 2024-07-22: 100 ug via INTRAVENOUS

## 2024-07-22 MED ORDER — PROPOFOL 10 MG/ML IV BOLUS
INTRAVENOUS | Status: DC | PRN
Start: 2024-07-22 — End: 2024-07-22
  Administered 2024-07-22: 70 mg via INTRAVENOUS
  Administered 2024-07-22 (×2): 20 mg via INTRAVENOUS

## 2024-07-22 NOTE — Interval H&P Note (Signed)
 History and Physical Interval Note:  07/22/2024 8:27 AM  Shane Chambers  has presented today for surgery, with the diagnosis of dysphagia.  The various methods of treatment have been discussed with the patient and family. After consideration of risks, benefits and other options for treatment, the patient has consented to  Procedure(s) with comments: EGD (ESOPHAGOGASTRODUODENOSCOPY) (N/A) - 9:00 am, asa 3 DILATION, ESOPHAGUS (N/A) as a surgical intervention.  The patient's history has been reviewed, patient examined, no change in status, stable for surgery.  I have reviewed the patient's chart and labs.  Questions were answered to the patient's satisfaction.     Shane Chambers

## 2024-07-22 NOTE — Discharge Instructions (Addendum)
 EGD Discharge instructions Please read the instructions outlined below and refer to this sheet in the next few weeks. These discharge instructions provide you with general information on caring for yourself after you leave the hospital. Your doctor may also give you specific instructions. While your treatment has been planned according to the most current medical practices available, unavoidable complications occasionally occur. If you have any problems or questions after discharge, please call your doctor. ACTIVITY You may resume your regular activity but move at a slower pace for the next 24 hours.  Take frequent rest periods for the next 24 hours.  Walking will help expel (get rid of) the air and reduce the bloated feeling in your abdomen.  No driving for 24 hours (because of the anesthesia (medicine) used during the test).  You may shower.  Do not sign any important legal documents or operate any machinery for 24 hours (because of the anesthesia used during the test).  NUTRITION Drink plenty of fluids.  You may resume your normal diet.  Begin with a light meal and progress to your normal diet.  Avoid alcoholic beverages for 24 hours or as instructed by your caregiver.  MEDICATIONS You may resume your normal medications unless your caregiver tells you otherwise.  WHAT YOU CAN EXPECT TODAY You may experience abdominal discomfort such as a feeling of fullness or "gas" pains.  FOLLOW-UP Your doctor will discuss the results of your test with you.  SEEK IMMEDIATE MEDICAL ATTENTION IF ANY OF THE FOLLOWING OCCUR: Excessive nausea (feeling sick to your stomach) and/or vomiting.  Severe abdominal pain and distention (swelling).  Trouble swallowing.  Temperature over 101 F (37.8 C).  Rectal bleeding or vomiting of blood.   Your EGD revealed mild amount inflammation in your stomach.  I took biopsies of this to rule out infection with a bacteria called H. pylori.  You also had congestion in your  small bowel which I sampled.  Await pathology results, my office will contact you.  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  I took samples of this as well.  If positive we will treat with 2-week course of antifungal.  Continue on pantoprazole  40 mg daily.  Follow-up in GI office in 8 weeks.  I hope you have a great rest of your week!  Carlin POUR. Cindie, D.O. Gastroenterology and Hepatology [facility]

## 2024-07-22 NOTE — Anesthesia Preprocedure Evaluation (Signed)
 Anesthesia Evaluation  Patient identified by MRN, date of birth, ID band Patient awake    Reviewed: Allergy & Precautions, H&P , NPO status , Patient's Chart, lab work & pertinent test results  Airway Mallampati: II  TM Distance: >3 FB Neck ROM: Full    Dental no notable dental hx.    Pulmonary sleep apnea , COPD, former smoker   Pulmonary exam normal breath sounds clear to auscultation       Cardiovascular hypertension, + CAD, + Past MI, + Peripheral Vascular Disease and +CHF  Normal cardiovascular exam Rhythm:Regular Rate:Normal     Neuro/Psych  PSYCHIATRIC DISORDERS  Depression    negative neurological ROS     GI/Hepatic Neg liver ROS,GERD  ,,  Endo/Other  diabetesHypothyroidism    Renal/GU Renal disease  negative genitourinary   Musculoskeletal  (+) Arthritis ,    Abdominal   Peds negative pediatric ROS (+)  Hematology  (+) Blood dyscrasia, anemia   Anesthesia Other Findings   Reproductive/Obstetrics negative OB ROS                              Anesthesia Physical Anesthesia Plan  ASA: 2  Anesthesia Plan: General   Post-op Pain Management:    Induction: Intravenous  PONV Risk Score and Plan:   Airway Management Planned: Nasal Cannula  Additional Equipment:   Intra-op Plan:   Post-operative Plan:   Informed Consent: I have reviewed the patients History and Physical, chart, labs and discussed the procedure including the risks, benefits and alternatives for the proposed anesthesia with the patient or authorized representative who has indicated his/her understanding and acceptance.     Dental advisory given  Plan Discussed with: CRNA  Anesthesia Plan Comments:         Anesthesia Quick Evaluation

## 2024-07-22 NOTE — Anesthesia Postprocedure Evaluation (Signed)
 Anesthesia Post Note  Patient: Shane Chambers  Procedure(s) Performed: EGD (ESOPHAGOGASTRODUODENOSCOPY) DILATION, ESOPHAGUS  Patient location during evaluation: PACU Anesthesia Type: General Level of consciousness: awake and alert Pain management: pain level controlled Vital Signs Assessment: post-procedure vital signs reviewed and stable Respiratory status: spontaneous breathing, nonlabored ventilation, respiratory function stable and patient connected to nasal cannula oxygen Cardiovascular status: blood pressure returned to baseline and stable Postop Assessment: no apparent nausea or vomiting Anesthetic complications: no   No notable events documented.   Last Vitals:  Vitals:   07/22/24 0803 07/22/24 0857  BP: 133/67 (!) 101/42  Pulse: (!) 50 (!) 46  Resp: 13 15  Temp: 36.6 C 36.7 C  SpO2: 100% 100%    Last Pain:  Vitals:   07/22/24 0857  TempSrc: Axillary  PainSc: 0-No pain                 Andrea Limes

## 2024-07-22 NOTE — Op Note (Signed)
 [facility] Patient Name: Shane Chambers Procedure Date: 07/22/2024 8:25 AM MRN: [id number] Date of Birth: [birth year] Attending MD: Carlin POUR. Cindie , [place], [id number] CSN: [id number] Age: 63 Admit Type: Outpatient Procedure:                Upper GI endoscopy Indications:              Dysphagia, Heartburn Providers:                Carlin POUR. Cindie, DO, Olam Ada, RN, Daphne Mulch                            Technician, Technician Referring MD:              Medicines:                See the Anesthesia note for documentation of the                            administered medications Complications:            No immediate complications. Estimated Blood Loss:     Estimated blood loss was minimal. Procedure:                Pre-Anesthesia Assessment:                           - The anesthesia plan was to use monitored                            anesthesia care (MAC).                           After obtaining informed consent, the endoscope was                            passed under direct vision. Throughout the                            procedure, the patient's blood pressure, pulse, and                            oxygen saturations were monitored continuously. The                            [id number] ([id number]) Upper was introduced through                            the mouth, and advanced to the second part of                            duodenum. The upper GI endoscopy was accomplished                            without difficulty. The patient tolerated the                            procedure well. Scope In: 8:40:56  AM Scope Out: 8:48:46 AM Total Procedure Duration: 0 hours 7 minutes 50 seconds  Findings:      White nummular lesions were noted in the upper third of the esophagus       and in the middle third of the esophagus. Cells for cytology were       obtained by brushing.      Patchy mild inflammation characterized by erythema was found in the       entire examined stomach.  Biopsies were taken with a cold forceps for       Helicobacter pylori testing.      Mucosal variance characterized by pseudomelanosis was found in the       gastric antrum.      Localized moderate mucosal changes characterized by congestion were       found in the duodenal bulb. Biopsies were taken with a cold forceps for       histology.      The second portion of the duodenum was normal. Impression:               - White nummular lesions in esophageal mucosa.                            Cells for cytology obtained.                           - Gastritis. Biopsied.                           - Gastric mucosal variant.                           - Mucosal changes in the duodenum. Biopsied.                           - Normal second portion of the duodenum. Moderate Sedation:      Per Anesthesia Care Recommendation:           - Patient has a contact number available for                            emergencies. The signs and symptoms of potential                            delayed complications were discussed with the                            patient. Return to normal activities tomorrow.                            Written discharge instructions were provided to the                            patient.                           - Resume previous diet.                           -  Continue present medications.                           - Await pathology results.                           - Return to GI clinic in 8 weeks.                           - Treat for candidal esophagitis if cytology                            positive Procedure Code(s):        --- Professional ---                           [phone], Esophagogastroduodenoscopy, flexible,                            transoral; with biopsy, single or multiple Diagnosis Code(s):        --- Professional ---                           K22.89, Other specified disease of esophagus                           K29.70, Gastritis, unspecified, without  bleeding                           K31.89, Other diseases of stomach and duodenum                           R13.10, Dysphagia, unspecified                           R12, Heartburn CPT copyright 2022 [employer]. All rights reserved. The codes documented in this report are preliminary and upon coder review may  be revised to meet current compliance requirements. Carlin POUR. Cindie, DO Carlin POUR. Cindie, DO 07/22/2024 8:53:38 AM This report has been signed electronically. Number of Addenda: 0

## 2024-07-22 NOTE — Transfer of Care (Signed)
 Immediate Anesthesia Transfer of Care Note  Patient: Shane Chambers  Procedure(s) Performed: EGD (ESOPHAGOGASTRODUODENOSCOPY) DILATION, ESOPHAGUS  Patient Location: Short Stay  Anesthesia Type:General  Level of Consciousness: awake, alert , oriented, and patient cooperative  Airway & Oxygen Therapy: Patient Spontanous Breathing  Post-op Assessment: Report given to RN and Post -op Vital signs reviewed and stable  Post vital signs: Reviewed and stable  Last Vitals:  Vitals Value Taken Time  BP    Temp    Pulse    Resp    SpO2      Last Pain:  Vitals:   07/22/24 0803  TempSrc: Oral  PainSc: 4       Patients Stated Pain Goal: 4 (07/22/24 0803)  Complications: No notable events documented.

## 2024-07-25 ENCOUNTER — Encounter (HOSPITAL_COMMUNITY): Payer: Self-pay | Admitting: Oncology

## 2024-07-25 ENCOUNTER — Encounter (HOSPITAL_COMMUNITY): Payer: Self-pay | Admitting: Internal Medicine

## 2024-07-25 DIAGNOSIS — Z2981 Encounter for HIV pre-exposure prophylaxis: Secondary | ICD-10-CM | POA: Insufficient documentation

## 2024-07-25 LAB — SURGICAL PATHOLOGY

## 2024-07-27 ENCOUNTER — Ambulatory Visit (INDEPENDENT_AMBULATORY_CARE_PROVIDER_SITE_OTHER): Admitting: Infectious Disease

## 2024-07-27 ENCOUNTER — Other Ambulatory Visit: Payer: Self-pay

## 2024-07-27 ENCOUNTER — Encounter: Payer: Self-pay | Admitting: Infectious Disease

## 2024-07-27 ENCOUNTER — Telehealth: Payer: Self-pay

## 2024-07-27 ENCOUNTER — Encounter (HOSPITAL_COMMUNITY): Payer: Self-pay | Admitting: Oncology

## 2024-07-27 ENCOUNTER — Other Ambulatory Visit (HOSPITAL_COMMUNITY): Payer: Self-pay

## 2024-07-27 ENCOUNTER — Ambulatory Visit: Payer: Self-pay | Admitting: Internal Medicine

## 2024-07-27 VITALS — BP 145/73 | HR 49 | Temp 97.4°F

## 2024-07-27 DIAGNOSIS — M009 Pyogenic arthritis, unspecified: Secondary | ICD-10-CM | POA: Diagnosis not present

## 2024-07-27 DIAGNOSIS — Z794 Long term (current) use of insulin: Secondary | ICD-10-CM

## 2024-07-27 DIAGNOSIS — Z951 Presence of aortocoronary bypass graft: Secondary | ICD-10-CM

## 2024-07-27 DIAGNOSIS — Z2981 Encounter for HIV pre-exposure prophylaxis: Secondary | ICD-10-CM

## 2024-07-27 DIAGNOSIS — E11649 Type 2 diabetes mellitus with hypoglycemia without coma: Secondary | ICD-10-CM | POA: Diagnosis not present

## 2024-07-27 MED ORDER — APRETUDE 600 MG/3ML IM SUER
600.0000 mg | INTRAMUSCULAR | 1 refills | Status: DC
Start: 2024-07-27 — End: 2024-08-12
  Filled 2024-07-27: qty 3, 30d supply, fill #0

## 2024-07-27 NOTE — Telephone Encounter (Signed)
 Shane Chambers presented for his visit today with Dr. Fleeta Rothman. During encounter, he expressed interest in injectable PrEP.   Spoke with the [name]  after his visit with the provider and provided counseling on Apretude  and Yeztugo. Explained that Apretude  is administered as a once-monthly gluteal injection for the first two months, then transitioned to every 2 months. Side effects would include injection site pain, soreness, and hard lumps or nodules. If these side effects occur, encourage the patient to use over-the-counter pain medication, like acetaminophen , warm compresses, and gentle stretching to help reduce the pain.   Discussed [name] as a secondary injection and provided counseling that it is administered as two subcutaneous injections in the abdomen taken concurrently with an oral startup (two tablets on the first day of injection, followed by two tablets the day after the injection). Explained that this injection would be every 6 months. Explained that the side effects are similar to Apretude , but he would use cold compress that day of to reduce pain associated with the injection.   He states that he is more interested in the Apretude  injection. Will reach out to patient advocate to follow-up on cost. He is scheduled for pharmacy follow-up on 08/29/24.   [name], PharmD PGY1 Pharmacy Resident  07/27/2024

## 2024-07-27 NOTE — Progress Notes (Signed)
 Specialty Pharmacy Initial Fill Coordination Note  Shane Chambers is a 63 y.o. male contacted today regarding initial fill of specialty medication(s) Cabotegravir  (Apretude )   Patient requested Courier to Provider Office   Delivery date: 08/01/24   Verified address: [address] [place] [address]   Medication will be filled on 07/29/24.   Patient is aware of 0.00 copayment.

## 2024-07-27 NOTE — Progress Notes (Signed)
 Subjective:  Chief complaint: follow-up for septic elbow   Patient ID: Shane Chambers, male    DOB: 1961-03-23, 63 y.o.   MRN: [id number]  HPI  Discussed the use of AI scribe software for clinical note transcription with the patient, who gave verbal consent to proceed.  History of Present Illness   Shane Chambers is a 63 year old male who presents for follow-up on his infected elbow and discussion of HPV-related concerns.  He is here for follow-up on his previously infected elbow, which is currently asymptomatic with no pain reported. He completed treatment for the infection.  He has a history of HPV diagnosed in 2002, initially presenting as warts around the rectal area. Treatment with Aldara cream was effective, and he has not noticed any recurrence of warts since 2007.  Approximately two years ago, he was diagnosed with rectal cancer during a colonoscopy. The cancer was surgically removed, preserving his anatomy without the need for a diverting ostomy. He is currently under oncological surveillance for this condition.  He has not been sexually active for a long time and lacks interest in sexual activity.      Past Medical History:  Diagnosis Date  . Acute on chronic combined systolic and diastolic CHF (congestive heart failure) (HCC) 03/05/2020  . Acute on chronic heart failure with preserved ejection fraction (HFpEF) (HCC) 03/27/2023  . Anemia   . Arthritis   . CAD (coronary artery disease)    a. s/p CABG in 03/2020 with LIMA-LAD, RIMA-PL, RA-D1-RI-OM1  . Cancer (HCC)    rectal  . Cellulitis   . COPD (chronic obstructive pulmonary disease) (HCC)   . Depression   . Diabetes mellitus without complication (HCC)   . Encounter for HIV pre-exposure prophylaxis 07/25/2024  . GERD (gastroesophageal reflux disease)   . History of kidney stones   . Hyperlipidemia 12/01/2019  . Hypertension   . Hypothyroidism   . Ischemic cardiomyopathy    a. EF 20-25% by echo in 02/2020 b. at  40% by echo in 03/2020 c. EF normalized to 60-65% by echo in 04/2020  . Myocardial infarction (HCC)   . Peripheral vascular disease (HCC)   . Sleep apnea   . Type 2 diabetes mellitus (HCC)     Past Surgical History:  Procedure Laterality Date  . AMPUTATION Right 10/05/2022   Procedure: AMPUTATION BELOW KNEE;  Surgeon: Harden Jerona GAILS, MD;  Location: [facility] OR;  Service: Orthopedics;  Laterality: Right;  . AMPUTATION Right 11/14/2022   Procedure: AMPUTATION BELOW KNEE REVISION; WASHOUT;  Surgeon: Jama Cordella MATSU, MD;  Location: [facility] ORS;  Service: Vascular;  Laterality: Right;  . AMPUTATION Right 11/18/2022   Procedure: AMPUTATION BELOW KNEE REVISION AND CLOSURE;  Surgeon: Jama Cordella MATSU, MD;  Location: [facility] ORS;  Service: Vascular;  Laterality: Right;  . APPLICATION OF WOUND VAC Right 10/05/2022   Procedure: APPLICATION OF WOUND VAC;  Surgeon: Harden Jerona GAILS, MD;  Location: [facility] OR;  Service: Orthopedics;  Laterality: Right;  . BIOPSY  07/18/2021   Procedure: BIOPSY;  Surgeon: [name], DO;  [facility];  Service: Endoscopy;;  . BIOPSY  01/09/2022   Procedure: BIOPSY;  Surgeon: [name], DO;  [facility];  Service: Endoscopy;;  . BIOPSY  06/29/2023   Procedure: BIOPSY;  Surgeon: [name], DO;  [facility];  Service: Endoscopy;;  . COLONOSCOPY WITH PROPOFOL  N/A 07/18/2021   Carver: 15 millimeter polyp removed from the sigmoid colon, 5  mm polyp removed from sigmoid colon.  Nonbleeding internal hemorrhoids.  Significant looping of the colon. sigmoid path showed invasive colonic adenocarcinoma involving tubular adenoma (invades to depth of 2mm, carcinoma 1mm from margin, no lymphovascular invasion, no poorly differentiated component.  . COLONOSCOPY WITH PROPOFOL  N/A 06/29/2023   Procedure: COLONOSCOPY WITH PROPOFOL ;  Surgeon: [name], DO;  [facility];  Service: Endoscopy;  Laterality: N/A;  7:30 am, asa 3  . CORONARY  ARTERY BYPASS GRAFT N/A 03/13/2020   Procedure: CORONARY ARTERY BYPASS GRAFTING (CABG) times five using bilateral Internal mammary arteries and left radial artery;  Surgeon: German Bartlett PEDLAR, MD;  Location: [facility] OR;  Service: Open Heart Surgery;  Laterality: N/A;  . DENTAL SURGERY    . ESOPHAGEAL DILATION N/A 07/22/2024   Procedure: DILATION, ESOPHAGUS;  Surgeon: [name], DO;  [facility];  Service: Endoscopy;  Laterality: N/A;  . ESOPHAGOGASTRODUODENOSCOPY N/A 07/22/2024   Procedure: EGD (ESOPHAGOGASTRODUODENOSCOPY);  Surgeon: [name], DO;  [facility];  Service: Endoscopy;  Laterality: N/A;  9:00 am, asa 3  . ESOPHAGOGASTRODUODENOSCOPY (EGD) WITH PROPOFOL  N/A 07/18/2021   Carver: 1 gastric polyp status post biopsy, gastritis. gastric bx with slight chronic inflammation and no H.pyori. GEJ polypectomy with mild inflammation only  . FLEXIBLE SIGMOIDOSCOPY N/A 08/26/2021   Carver: Nonbleeding internal hemorrhoids.  15 mm ulcers from previous polypectomy found in the rectum.  No evidence of previous polyp.  Located 5 to 8 cm from anal verge.  SABRA FLEXIBLE SIGMOIDOSCOPY N/A 01/09/2022   Procedure: FLEXIBLE SIGMOIDOSCOPY;  Surgeon: [name], DO;  [facility];  Service: Endoscopy;  Laterality: N/A;  . IR CYSTOSTOMY TUBE PLACEMENT/BLADDER ASPIRATION  03/11/2024  . IRRIGATION AND DEBRIDEMENT ELBOW Left 02/20/2024   Procedure: LEFT ELBOW IRRIGATION AND DEBRIDEMENT;  Surgeon: Germaine Redbird, MD;  Location: [facility] OR;  Service: Orthopedics;  Laterality: Left;  . POLYPECTOMY  07/18/2021   Procedure: POLYPECTOMY INTESTINAL;  Surgeon: [name], DO;  [facility];  Service: Endoscopy;;  . POLYPECTOMY  06/29/2023   Procedure: POLYPECTOMY INTESTINAL;  Surgeon: [name], DO;  [facility];  Service: Endoscopy;;  . RADIAL ARTERY HARVEST Left 03/13/2020   Procedure: RADIAL ARTERY HARVEST,;  Surgeon: German Bartlett PEDLAR, MD;   Location: [facility];  Service: Open Heart Surgery;  Laterality: Left;  . RIGHT/LEFT HEART CATH AND CORONARY ANGIOGRAPHY N/A 03/07/2020   Procedure: RIGHT/LEFT HEART CATH AND CORONARY ANGIOGRAPHY;  Surgeon: Wonda Sharper, MD;  Location: [facility] INVASIVE CV LAB;  Service: Cardiovascular;  Laterality: N/A;  . STUMP REVISION Right 12/17/2022   Procedure: RIGHT BELOW KNEE AMPUTATION REVISION;  Surgeon: Harden Jerona GAILS, MD;  Location: [facility] OR;  Service: Orthopedics;  Laterality: Right;  . SUBMUCOSAL LIFTING INJECTION  01/09/2022   Procedure: SUBMUCOSAL LIFTING INJECTION;  Surgeon: [name], DO;  [facility];  Service: Endoscopy;;  . SUBMUCOSAL TATTOO INJECTION  01/09/2022   Procedure: SUBMUCOSAL TATTOO INJECTION;  Surgeon: [name], DO;  [facility];  Service: Endoscopy;;  . TEE WITHOUT CARDIOVERSION N/A 03/13/2020   Procedure: TRANSESOPHAGEAL ECHOCARDIOGRAM (TEE);  Surgeon: German Bartlett PEDLAR, MD;  Location: [facility] OR;  Service: Open Heart Surgery;  Laterality: N/A;    Family History  Problem Relation Age of Onset  . Hypertension Mother       Social History   Socioeconomic History  . Marital status: Single    Spouse name: Not on file  .  Number of children: Not on file  . Years of education: Not on file  . Highest education level: Not on file  Occupational History  . Not on file  Tobacco Use  . Smoking status: Former    Current packs/day: 0.00    Types: Cigarettes    Quit date: 05/25/1995    Years since quitting: 29.1  . Smokeless tobacco: Never  Vaping Use  . Vaping status: Never Used  Substance and Sexual Activity  . Alcohol  use: Not Currently    Comment: rarely  . Drug use: No  . Sexual activity: Not Currently  Other Topics Concern  . Not on file  Social History Narrative  . Not on file   Social Drivers of Health   Financial Resource Strain: Not on file  Food Insecurity: No Food Insecurity (02/21/2024)   Hunger Vital Sign   . Worried About Community education officer in the Last Year: Never true   . Ran Out of Food in the Last Year: Never true  Transportation Needs: No Transportation Needs (02/21/2024)   PRAPARE - Transportation   . Lack of Transportation (Medical): No   . Lack of Transportation (Non-Medical): No  Physical Activity: Not on file  Stress: Not on file  Social Connections: Unknown (02/21/2024)   Social Connection and Isolation Panel   . Frequency of Communication with Friends and Family: Not on file   . Frequency of Social Gatherings with Friends and Family: Not on file   . Attends Religious Services: Not on file   . Active Member of Clubs or Organizations: Not on file   . Attends Banker Meetings: Not on file   . Marital Status: Never married    Allergies  Allergen Reactions  . Ceftriaxone  Anaphylaxis    (Rocephin ) Previously tolerated at [facility] during multiple admissions. Patient reports SOB reaction in [place], Summer 2024. Now reporting SOB reaction at [facility] 02/21/24. No swelling, or skin reaction. Satted well on baseline oxygen. Does not wish to receive Ceftriaxone  in the future.  . Ace Inhibitors Swelling and Cough  . Chlorhexidine    . Haloperidol  And Related     Do NOT give anti-psychotics due to risk of  Torsades and QT prolongation (per Dr. Trude request)  . Other Itching    [name]  . Pork Allergy Other (See Comments)    Not listed in MAR  . Shellfish Allergy     Listed on MAR  . Penicillins Itching and Rash    Tolerated ancef  (12-17-22)     Current Outpatient Medications:  .  aspirin  81 MG EC tablet, Take 1 tablet (81 mg total) by mouth daily with breakfast., Disp: 30 tablet, Rfl: 12 .  atorvastatin  (LIPITOR ) 40 MG tablet, Take 1 tablet (40 mg total) by mouth at bedtime., Disp: , Rfl:  .  carvedilol  (COREG ) 25 MG tablet, Take 1 tablet (25 mg total) by mouth 2 (two) times daily with a meal., Disp: , Rfl:  .  ciprofloxacin  (CILOXAN ) 0.3 % ophthalmic solution, , Disp: , Rfl:   .  ferrous gluconate  (FERGON) 324 MG tablet, Take 324 mg by mouth daily., Disp: , Rfl:  .  fluticasone  (FLONASE ) 50 MCG/ACT nasal spray, Place into both nostrils., Disp: , Rfl:  .  fluticasone -salmeterol (ADVAIR) 500-50 MCG/ACT AEPB, Inhale 1 puff into the lungs in the morning and at bedtime., Disp: , Rfl:  .  gabapentin  (NEURONTIN ) 600 MG tablet, Take 600 mg by mouth 2 (two) times daily., Disp: ,  Rfl:  .  hydrALAZINE  (APRESOLINE ) 100 MG tablet, Take 1 tablet (100 mg total) by mouth 2 (two) times daily., Disp: , Rfl:  .  HYDROcodone -acetaminophen  (NORCO/VICODIN) 5-325 MG tablet, Take by mouth., Disp: , Rfl:  .  insulin  aspart (NOVOLOG  FLEXPEN) 100 UNIT/ML FlexPen, 0-15 Units, Subcutaneous, 3 times daily with meals CBG < 70: implement hypoglycemia protocol-call MD CBG 70 - 120: 0 units CBG 121 - 150: 2 units CBG 151 - 200: 3 units CBG 201 - 250: 5 units CBG 251 - 300: 8 units CBG 301 - 350: 11 units CBG 351 - 400: 15 units CBG > 400:, Disp: 15 mL, Rfl: 0 .  lactulose  (CHRONULAC ) 10 GM/15ML solution, Take 15 mLs (10 g total) by mouth 2 (two) times daily as needed for mild constipation., Disp: 473 mL, Rfl: 0 .  lamoTRIgine (LAMICTAL) 100 MG tablet, Take 100 mg by mouth 2 (two) times daily., Disp: , Rfl:  .  levothyroxine  (SYNTHROID ) 100 MCG tablet, Take 100 mcg by mouth daily., Disp: , Rfl:  .  lubiprostone  (AMITIZA ) 24 MCG capsule, Take 1 capsule (24 mcg total) by mouth 2 (two) times daily with a meal., Disp: 60 capsule, Rfl: 3 .  lubiprostone  (AMITIZA ) 8 MCG capsule, Take 1 capsule (8 mcg total) by mouth 2 (two) times daily with a meal., Disp: 60 capsule, Rfl: 5 .  Luliconazole 1 % CREA, Apply topically., Disp: , Rfl:  .  metolazone  (ZAROXOLYN ) 2.5 MG tablet, Take 1 tablet (2.5 mg total) by mouth once a week. On Wednesdays, Disp: , Rfl:  .  pantoprazole  (PROTONIX ) 40 MG tablet, Take 1 tablet (40 mg total) by mouth daily. Before breakfast, Disp: 90 tablet, Rfl: 3 .  polyvinyl alcohol  (LIQUIFILM TEARS)  1.4 % ophthalmic solution, Place 1 drop into both eyes daily., Disp: , Rfl:  .  potassium chloride  (KLOR-CON ) 10 MEQ tablet, Take 10 mEq by mouth daily., Disp: , Rfl:  .  sodium bicarbonate  325 MG tablet, Take 325 mg by mouth 2 (two) times daily., Disp: , Rfl:  .  tamsulosin  (FLOMAX ) 0.4 MG CAPS capsule, Take 0.8 mg by mouth every evening., Disp: , Rfl:  .  thiamine  (VITAMIN B-1) 100 MG tablet, Take 1 tablet (100 mg total) by mouth daily., Disp: , Rfl:  .  torsemide  (DEMADEX ) 20 MG tablet, Take 2 tablets (40 mg total) by mouth 2 (two) times daily., Disp: , Rfl:  .  traZODone  (DESYREL ) 100 MG tablet, Take 200 mg by mouth at bedtime., Disp: , Rfl:    Review of Systems  Constitutional:  Negative for activity change, appetite change, chills, diaphoresis, fatigue, fever and unexpected weight change.  HENT:  Negative for congestion, rhinorrhea, sinus pressure, sneezing, sore throat and trouble swallowing.   Eyes:  Negative for photophobia and visual disturbance.  Respiratory:  Negative for cough, chest tightness, shortness of breath, wheezing and stridor.   Cardiovascular:  Negative for chest pain, palpitations and leg swelling.  Gastrointestinal:  Negative for abdominal distention, abdominal pain, anal bleeding, blood in stool, constipation, diarrhea, nausea and vomiting.  Genitourinary:  Negative for difficulty urinating, dysuria, flank pain and hematuria.  Musculoskeletal:  Negative for arthralgias, back pain, gait problem, joint swelling and myalgias.  Skin:  Negative for color change, pallor, rash and wound.  Neurological:  Negative for dizziness, tremors, weakness and light-headedness.  Hematological:  Negative for adenopathy. Does not bruise/bleed easily.  Psychiatric/Behavioral:  Negative for agitation, behavioral problems, confusion, decreased concentration, dysphoric mood and sleep disturbance.  Objective:   Physical Exam Constitutional:      Appearance: He is well-developed.   HENT:     Head: Normocephalic and atraumatic.  Eyes:     Conjunctiva/sclera: Conjunctivae normal.  Cardiovascular:     Rate and Rhythm: Normal rate and regular rhythm.  Pulmonary:     Effort: Pulmonary effort is normal. No respiratory distress.     Breath sounds: No wheezing.  Abdominal:     General: There is no distension.     Palpations: Abdomen is soft.  Musculoskeletal:        General: No tenderness. Normal range of motion.     Cervical back: Normal range of motion and neck supple.  Skin:    General: Skin is warm and dry.     Coloration: Skin is not pale.     Findings: No erythema or rash.  Neurological:     General: No focal deficit present.     Mental Status: He is alert and oriented to person, place, and time.  Psychiatric:        Mood and Affect: Mood normal.        Behavior: Behavior normal.        Thought Content: Thought content normal.        Judgment: Judgment normal.           Assessment & Plan:   Assessment and Plan    Infected elbow: clinically cured check ESR, CRP   HPV the real issue here is rectal cancer prevention, surveillance, unless there are warts  Rectal cancer, status post resection Rectal cancer resected two years ago. Under oncologist follow-up for recurrence monitoring. No symptoms or signs of recurrence. - Continue follow-up with oncologist for rectal cancer surveillance.  HIV pre-exposure prophylaxis (PrEP) management Discussed PrEP options. Prefers injectable PrEP but open to oral PrEP pending insurance coverage  Of note he does of CKD and this would take Truvada (and by FDA Descovy) off of the table for him. I personally feel that Descovy is perfectly safe even for CKD patients and I use it in treating my patients with HIV without regard to CrCl  That being said this fact may be helpful data to procure him Apretude  or [name]   Working with pharmacy on getting these and then will plug him in with PrEP clinic

## 2024-07-28 ENCOUNTER — Other Ambulatory Visit: Payer: Self-pay

## 2024-07-28 LAB — CBC WITH DIFFERENTIAL/PLATELET
Absolute Lymphocytes: 1251 {cells}/uL (ref 850–3900)
Absolute Monocytes: 782 {cells}/uL (ref 200–950)
Basophils Absolute: 28 {cells}/uL (ref 0–200)
Basophils Relative: 0.3 %
Eosinophils Absolute: 442 {cells}/uL (ref 15–500)
Eosinophils Relative: 4.8 %
HCT: 30.2 % — ABNORMAL LOW (ref 38.5–50.0)
Hemoglobin: 9.8 g/dL — ABNORMAL LOW (ref 13.2–17.1)
MCH: 30.6 pg (ref 27.0–33.0)
MCHC: 32.5 g/dL (ref 32.0–36.0)
MCV: 94.4 fL (ref 80.0–100.0)
MPV: 10.8 fL (ref 7.5–12.5)
Monocytes Relative: 8.5 %
Neutro Abs: 6698 {cells}/uL (ref 1500–7800)
Neutrophils Relative %: 72.8 %
Platelets: 217 Thousand/uL (ref 140–400)
RBC: 3.2 Million/uL — ABNORMAL LOW (ref 4.20–5.80)
RDW: 13.2 % (ref 11.0–15.0)
Total Lymphocyte: 13.6 %
WBC: 9.2 Thousand/uL (ref 3.8–10.8)

## 2024-07-28 LAB — HIV ANTIBODY (ROUTINE TESTING W REFLEX)
HIV 1&2 Ab, 4th Generation: NONREACTIVE
HIV FINAL INTERPRETATION: NEGATIVE

## 2024-07-28 LAB — COMPLETE METABOLIC PANEL WITHOUT GFR
AG Ratio: 1.6 (calc) (ref 1.0–2.5)
ALT: 12 U/L (ref 9–46)
AST: 18 U/L (ref 10–35)
Albumin: 4.2 g/dL (ref 3.6–5.1)
Alkaline phosphatase (APISO): 89 U/L (ref 35–144)
BUN/Creatinine Ratio: 34 (calc) — ABNORMAL HIGH (ref 6–22)
BUN: 104 mg/dL — ABNORMAL HIGH (ref 7–25)
CO2: 35 mmol/L — ABNORMAL HIGH (ref 20–32)
Calcium: 9.5 mg/dL (ref 8.6–10.3)
Chloride: 90 mmol/L — ABNORMAL LOW (ref 98–110)
Creat: 3.08 mg/dL — ABNORMAL HIGH (ref 0.70–1.35)
Globulin: 2.7 g/dL (ref 1.9–3.7)
Glucose, Bld: 157 mg/dL — ABNORMAL HIGH (ref 65–99)
Potassium: 3.7 mmol/L (ref 3.5–5.3)
Sodium: 135 mmol/L (ref 135–146)
Total Bilirubin: 0.3 mg/dL (ref 0.2–1.2)
Total Protein: 6.9 g/dL (ref 6.1–8.1)

## 2024-07-28 LAB — C-REACTIVE PROTEIN: CRP: <3 mg/L

## 2024-07-28 LAB — HEPATITIS B SURFACE ANTIGEN: Hepatitis B Surface Ag: NONREACTIVE

## 2024-07-28 LAB — HEPATITIS B SURFACE ANTIBODY, QUANTITATIVE: Hep B S AB Quant (Post): 947 m[IU]/mL (ref >=10)

## 2024-07-28 LAB — SEDIMENTATION RATE: Sed Rate: 68 mm/h — ABNORMAL HIGH (ref 0–20)

## 2024-07-28 LAB — HEPATITIS A ANTIBODY, TOTAL: Hepatitis A AB,Total: REACTIVE — AB

## 2024-07-28 LAB — HEPATITIS C AB W/RFL RNA, PCR + GENO: Hepatitis C Ab: NONREACTIVE

## 2024-08-01 ENCOUNTER — Telehealth: Payer: Self-pay

## 2024-08-01 ENCOUNTER — Ambulatory Visit

## 2024-08-01 DIAGNOSIS — R339 Retention of urine, unspecified: Secondary | ICD-10-CM

## 2024-08-01 DIAGNOSIS — N319 Neuromuscular dysfunction of bladder, unspecified: Secondary | ICD-10-CM

## 2024-08-01 NOTE — Telephone Encounter (Signed)
 [facility] Patient Advocate Encounter  Patient's medications Apretude  have been couriered to [facility] from [facility] and will be administered at the patients appointment on 08/29/24.  Shane Chambers, CPhT Specialty Pharmacy Patient [facility] Phone: [phone] Fax:  [phone]

## 2024-08-01 NOTE — Progress Notes (Signed)
 Suprapubic Cath Change  Patient is present today for a suprapubic catheter change due to urinary retention.  10 ml of water  was drained from the balloon, a 20 FR foley cath was removed from the tract with out difficulty.  Suprapubic catheter site was cleaned and prepped in a sterile fashion with [id number]  A 20 FR foley cath was replaced into the tract no complications were noted. 70cc Flush return was noted, Urine Clear yellow in color . 10 ml of sterile water  was inflated into the balloon and a overnight bag was attached for drainage.  Patient tolerated well. A night bag was given to patient and proper instruction was given on how to switch bags.    Performed by: Carlos, CMA  Follow up: Keep monthly SP tube change

## 2024-08-09 ENCOUNTER — Encounter (HOSPITAL_COMMUNITY): Payer: Self-pay | Admitting: Emergency Medicine

## 2024-08-09 ENCOUNTER — Inpatient Hospital Stay (HOSPITAL_COMMUNITY)
Admission: EM | Admit: 2024-08-09 | Discharge: 2024-08-12 | DRG: 292 | Disposition: A | Discharge status: Skilled Nursing Facility | Source: Skilled Nursing Facility | Attending: Internal Medicine | Admitting: Internal Medicine

## 2024-08-09 ENCOUNTER — Other Ambulatory Visit: Payer: Self-pay

## 2024-08-09 ENCOUNTER — Emergency Department (HOSPITAL_COMMUNITY)

## 2024-08-09 DIAGNOSIS — E1151 Type 2 diabetes mellitus with diabetic peripheral angiopathy without gangrene: Secondary | ICD-10-CM | POA: Diagnosis present

## 2024-08-09 DIAGNOSIS — N319 Neuromuscular dysfunction of bladder, unspecified: Secondary | ICD-10-CM | POA: Diagnosis present

## 2024-08-09 DIAGNOSIS — Z87891 Personal history of nicotine dependence: Secondary | ICD-10-CM

## 2024-08-09 DIAGNOSIS — R7989 Other specified abnormal findings of blood chemistry: Secondary | ICD-10-CM | POA: Diagnosis present

## 2024-08-09 DIAGNOSIS — I5032 Chronic diastolic (congestive) heart failure: Secondary | ICD-10-CM | POA: Diagnosis present

## 2024-08-09 DIAGNOSIS — I255 Ischemic cardiomyopathy: Secondary | ICD-10-CM | POA: Diagnosis present

## 2024-08-09 DIAGNOSIS — Z888 Allergy status to other drugs, medicaments and biological substances status: Secondary | ICD-10-CM

## 2024-08-09 DIAGNOSIS — Z6835 Body mass index (BMI) 35.0-35.9, adult: Secondary | ICD-10-CM | POA: Diagnosis not present

## 2024-08-09 DIAGNOSIS — J9611 Chronic respiratory failure with hypoxia: Secondary | ICD-10-CM | POA: Diagnosis present

## 2024-08-09 DIAGNOSIS — I130 Hypertensive heart and chronic kidney disease with heart failure and stage 1 through stage 4 chronic kidney disease, or unspecified chronic kidney disease: Principal | ICD-10-CM | POA: Diagnosis present

## 2024-08-09 DIAGNOSIS — Z91013 Allergy to seafood: Secondary | ICD-10-CM

## 2024-08-09 DIAGNOSIS — N189 Chronic kidney disease, unspecified: Secondary | ICD-10-CM | POA: Diagnosis not present

## 2024-08-09 DIAGNOSIS — I2510 Atherosclerotic heart disease of native coronary artery without angina pectoris: Secondary | ICD-10-CM | POA: Diagnosis present

## 2024-08-09 DIAGNOSIS — I5042 Chronic combined systolic (congestive) and diastolic (congestive) heart failure: Secondary | ICD-10-CM | POA: Diagnosis present

## 2024-08-09 DIAGNOSIS — R1011 Right upper quadrant pain: Secondary | ICD-10-CM | POA: Diagnosis present

## 2024-08-09 DIAGNOSIS — N390 Urinary tract infection, site not specified: Secondary | ICD-10-CM | POA: Diagnosis present

## 2024-08-09 DIAGNOSIS — E1122 Type 2 diabetes mellitus with diabetic chronic kidney disease: Secondary | ICD-10-CM | POA: Diagnosis present

## 2024-08-09 DIAGNOSIS — D630 Anemia in neoplastic disease: Secondary | ICD-10-CM | POA: Diagnosis present

## 2024-08-09 DIAGNOSIS — Z794 Long term (current) use of insulin: Secondary | ICD-10-CM | POA: Diagnosis not present

## 2024-08-09 DIAGNOSIS — Z860100 Personal history of colon polyps, unspecified: Secondary | ICD-10-CM

## 2024-08-09 DIAGNOSIS — Z881 Allergy status to other antibiotic agents status: Secondary | ICD-10-CM

## 2024-08-09 DIAGNOSIS — Z89511 Acquired absence of right leg below knee: Secondary | ICD-10-CM

## 2024-08-09 DIAGNOSIS — K219 Gastro-esophageal reflux disease without esophagitis: Secondary | ICD-10-CM | POA: Diagnosis present

## 2024-08-09 DIAGNOSIS — K8020 Calculus of gallbladder without cholecystitis without obstruction: Secondary | ICD-10-CM | POA: Diagnosis present

## 2024-08-09 DIAGNOSIS — Z951 Presence of aortocoronary bypass graft: Secondary | ICD-10-CM

## 2024-08-09 DIAGNOSIS — E785 Hyperlipidemia, unspecified: Secondary | ICD-10-CM | POA: Diagnosis present

## 2024-08-09 DIAGNOSIS — E039 Hypothyroidism, unspecified: Secondary | ICD-10-CM | POA: Diagnosis present

## 2024-08-09 DIAGNOSIS — Z792 Long term (current) use of antibiotics: Secondary | ICD-10-CM

## 2024-08-09 DIAGNOSIS — C20 Malignant neoplasm of rectum: Secondary | ICD-10-CM | POA: Diagnosis present

## 2024-08-09 DIAGNOSIS — N184 Chronic kidney disease, stage 4 (severe): Secondary | ICD-10-CM | POA: Diagnosis present

## 2024-08-09 DIAGNOSIS — E1129 Type 2 diabetes mellitus with other diabetic kidney complication: Secondary | ICD-10-CM | POA: Diagnosis present

## 2024-08-09 DIAGNOSIS — Z7951 Long term (current) use of inhaled steroids: Secondary | ICD-10-CM

## 2024-08-09 DIAGNOSIS — Z880 Allergy status to penicillin: Secondary | ICD-10-CM

## 2024-08-09 DIAGNOSIS — D649 Anemia, unspecified: Secondary | ICD-10-CM | POA: Diagnosis not present

## 2024-08-09 DIAGNOSIS — Z79899 Other long term (current) drug therapy: Secondary | ICD-10-CM

## 2024-08-09 DIAGNOSIS — N3000 Acute cystitis without hematuria: Secondary | ICD-10-CM | POA: Diagnosis not present

## 2024-08-09 DIAGNOSIS — I214 Non-ST elevation (NSTEMI) myocardial infarction: Secondary | ICD-10-CM | POA: Diagnosis present

## 2024-08-09 DIAGNOSIS — Z8719 Personal history of other diseases of the digestive system: Secondary | ICD-10-CM

## 2024-08-09 DIAGNOSIS — E119 Type 2 diabetes mellitus without complications: Secondary | ICD-10-CM | POA: Diagnosis not present

## 2024-08-09 DIAGNOSIS — F323 Major depressive disorder, single episode, severe with psychotic features: Secondary | ICD-10-CM | POA: Diagnosis present

## 2024-08-09 DIAGNOSIS — K581 Irritable bowel syndrome with constipation: Secondary | ICD-10-CM | POA: Diagnosis present

## 2024-08-09 DIAGNOSIS — Z79890 Hormone replacement therapy (postmenopausal): Secondary | ICD-10-CM

## 2024-08-09 DIAGNOSIS — Z7982 Long term (current) use of aspirin: Secondary | ICD-10-CM

## 2024-08-09 DIAGNOSIS — G4733 Obstructive sleep apnea (adult) (pediatric): Secondary | ICD-10-CM | POA: Diagnosis present

## 2024-08-09 DIAGNOSIS — J449 Chronic obstructive pulmonary disease, unspecified: Secondary | ICD-10-CM | POA: Diagnosis present

## 2024-08-09 DIAGNOSIS — I252 Old myocardial infarction: Secondary | ICD-10-CM

## 2024-08-09 DIAGNOSIS — Z87442 Personal history of urinary calculi: Secondary | ICD-10-CM

## 2024-08-09 DIAGNOSIS — Z91014 Allergy to mammalian meats: Secondary | ICD-10-CM

## 2024-08-09 DIAGNOSIS — Z8249 Family history of ischemic heart disease and other diseases of the circulatory system: Secondary | ICD-10-CM

## 2024-08-09 DIAGNOSIS — I10 Essential (primary) hypertension: Secondary | ICD-10-CM | POA: Diagnosis not present

## 2024-08-09 LAB — URINALYSIS, ROUTINE W REFLEX MICROSCOPIC
Bilirubin Urine: NEGATIVE
Glucose, UA: NEGATIVE mg/dL
Hgb urine dipstick: NEGATIVE
Ketones, ur: NEGATIVE mg/dL
Nitrite: NEGATIVE
Protein, ur: 100 mg/dL — AB
Specific Gravity, Urine: 1.009 (ref 1.005–1.030)
pH: 6 (ref 5.0–8.0)

## 2024-08-09 LAB — COMPREHENSIVE METABOLIC PANEL WITH GFR
ALT: 14 U/L (ref 0–44)
AST: 21 U/L (ref 15–41)
Albumin: 4.3 g/dL (ref 3.5–5.0)
Alkaline Phosphatase: 97 U/L (ref 38–126)
Anion gap: 15 (ref 5–15)
BUN: 107 mg/dL — ABNORMAL HIGH (ref 8–23)
CO2: 28 mmol/L (ref 22–32)
Calcium: 10.2 mg/dL (ref 8.9–10.3)
Chloride: 92 mmol/L — ABNORMAL LOW (ref 98–111)
Creatinine, Ser: 2.51 mg/dL — ABNORMAL HIGH (ref 0.61–1.24)
GFR, Estimated: 28 mL/min — ABNORMAL LOW (ref >60)
Glucose, Bld: 112 mg/dL — ABNORMAL HIGH (ref 70–99)
Potassium: 3.8 mmol/L (ref 3.5–5.1)
Sodium: 134 mmol/L — ABNORMAL LOW (ref 135–145)
Total Bilirubin: 0.3 mg/dL (ref 0.0–1.2)
Total Protein: 7.4 g/dL (ref 6.5–8.1)

## 2024-08-09 LAB — TROPONIN T, HIGH SENSITIVITY
Troponin T High Sensitivity: 274 ng/L (ref 0–19)
Troponin T High Sensitivity: 265 ng/L (ref 0–19)

## 2024-08-09 LAB — CBC WITH DIFFERENTIAL/PLATELET
Abs Immature Granulocytes: 0.07 K/uL (ref 0.00–0.07)
Basophils Absolute: 0 K/uL (ref 0.0–0.1)
Basophils Relative: 0 %
Eosinophils Absolute: 0.4 K/uL (ref 0.0–0.5)
Eosinophils Relative: 5 %
HCT: 29.7 % — ABNORMAL LOW (ref 39.0–52.0)
Hemoglobin: 10.1 g/dL — ABNORMAL LOW (ref 13.0–17.0)
Immature Granulocytes: 1 %
Lymphocytes Relative: 12 %
Lymphs Abs: 1 K/uL (ref 0.7–4.0)
MCH: 31.5 pg (ref 26.0–34.0)
MCHC: 34 g/dL (ref 30.0–36.0)
MCV: 92.5 fL (ref 80.0–100.0)
Monocytes Absolute: 0.8 K/uL (ref 0.1–1.0)
Monocytes Relative: 9 %
Neutro Abs: 6.2 K/uL (ref 1.7–7.7)
Neutrophils Relative %: 73 %
Platelets: 191 K/uL (ref 150–400)
RBC: 3.21 MIL/uL — ABNORMAL LOW (ref 4.22–5.81)
RDW: 12.5 % (ref 11.5–15.5)
WBC: 8.5 K/uL (ref 4.0–10.5)
nRBC: 0 % (ref 0.0–0.2)

## 2024-08-09 LAB — LIPASE, BLOOD: Lipase: 112 U/L — ABNORMAL HIGH (ref 11–51)

## 2024-08-09 LAB — MAGNESIUM: Magnesium: 2 mg/dL (ref 1.7–2.4)

## 2024-08-09 LAB — LACTIC ACID, PLASMA: Lactic Acid, Venous: 0.9 mmol/L (ref 0.5–1.9)

## 2024-08-09 MED ORDER — TRAZODONE HCL 100 MG PO TABS
200.0000 mg | ORAL_TABLET | Freq: Every day | ORAL | Status: DC
  Administered 2024-08-10 – 2024-08-11 (×3): 200 mg via ORAL
  Filled 2024-08-09 (×2): qty 2
  Filled 2024-08-09: qty 4

## 2024-08-09 MED ORDER — GABAPENTIN 600 MG PO TABS
600.0000 mg | ORAL_TABLET | Freq: Two times a day (BID) | ORAL | Status: DC
Start: 2024-08-10 — End: 2024-08-10
  Administered 2024-08-10 (×2): 600 mg via ORAL
  Filled 2024-08-09 (×2): qty 2

## 2024-08-09 MED ORDER — NITROGLYCERIN 2 % TD OINT
1.0000 [in_us] | TOPICAL_OINTMENT | Freq: Once | TRANSDERMAL | Status: AC
  Administered 2024-08-09: 1 [in_us] via TOPICAL
  Filled 2024-08-09: qty 1

## 2024-08-09 MED ORDER — CARVEDILOL 12.5 MG PO TABS
25.0000 mg | ORAL_TABLET | Freq: Two times a day (BID) | ORAL | Status: DC
  Administered 2024-08-11 – 2024-08-12 (×2): 25 mg via ORAL
  Filled 2024-08-09 (×3): qty 2

## 2024-08-09 MED ORDER — LACTATED RINGERS IV BOLUS
500.0000 mL | Freq: Once | INTRAVENOUS | Status: AC
  Administered 2024-08-09: 500 mL via INTRAVENOUS

## 2024-08-09 MED ORDER — PANTOPRAZOLE SODIUM 40 MG PO TBEC
40.0000 mg | DELAYED_RELEASE_TABLET | Freq: Every day | ORAL | Status: DC
  Administered 2024-08-10 – 2024-08-12 (×3): 40 mg via ORAL
  Filled 2024-08-09 (×3): qty 1

## 2024-08-09 MED ORDER — HYDROMORPHONE HCL 1 MG/ML IJ SOLN
0.5000 mg | Freq: Once | INTRAMUSCULAR | Status: AC
  Administered 2024-08-09: 0.5 mg via INTRAVENOUS
  Filled 2024-08-09: qty 0.5

## 2024-08-09 MED ORDER — HEPARIN BOLUS VIA INFUSION
2000.0000 [IU] | Freq: Once | INTRAVENOUS | Status: AC
  Administered 2024-08-09: 2000 [IU] via INTRAVENOUS

## 2024-08-09 MED ORDER — LUBIPROSTONE 24 MCG PO CAPS
24.0000 ug | ORAL_CAPSULE | Freq: Two times a day (BID) | ORAL | Status: DC
  Administered 2024-08-10 – 2024-08-12 (×4): 24 ug via ORAL
  Filled 2024-08-09 (×6): qty 1

## 2024-08-09 MED ORDER — HYDRALAZINE HCL 50 MG PO TABS
100.0000 mg | ORAL_TABLET | Freq: Two times a day (BID) | ORAL | Status: DC
  Administered 2024-08-10 – 2024-08-12 (×6): 100 mg via ORAL
  Filled 2024-08-09 (×6): qty 2

## 2024-08-09 MED ORDER — METOCLOPRAMIDE HCL 5 MG/ML IJ SOLN
10.0000 mg | Freq: Once | INTRAMUSCULAR | Status: AC
  Administered 2024-08-09: 10 mg via INTRAVENOUS
  Filled 2024-08-09: qty 2

## 2024-08-09 MED ORDER — ASPIRIN 81 MG PO CHEW
324.0000 mg | CHEWABLE_TABLET | Freq: Once | ORAL | Status: AC
  Administered 2024-08-09: 324 mg via ORAL
  Filled 2024-08-09: qty 4

## 2024-08-09 MED ORDER — ATORVASTATIN CALCIUM 40 MG PO TABS
40.0000 mg | ORAL_TABLET | Freq: Every day | ORAL | Status: DC
  Administered 2024-08-10 – 2024-08-11 (×3): 40 mg via ORAL
  Filled 2024-08-09 (×3): qty 1

## 2024-08-09 MED ORDER — LEVOTHYROXINE SODIUM 100 MCG PO TABS
100.0000 ug | ORAL_TABLET | Freq: Every day | ORAL | Status: DC
  Administered 2024-08-10 – 2024-08-12 (×3): 100 ug via ORAL
  Filled 2024-08-09 (×4): qty 1

## 2024-08-09 MED ORDER — HEPARIN (PORCINE) 25000 UT/250ML-% IV SOLN
1300.0000 [IU]/h | INTRAVENOUS | Status: DC
  Administered 2024-08-09 – 2024-08-10 (×2): 1300 [IU]/h via INTRAVENOUS
  Filled 2024-08-09 (×2): qty 250

## 2024-08-09 MED ORDER — IPRATROPIUM-ALBUTEROL 0.5-2.5 (3) MG/3ML IN SOLN: 3.0000 mL | Freq: Four times a day (QID) | RESPIRATORY_TRACT | Status: DC | PRN

## 2024-08-09 MED ORDER — HYDROMORPHONE HCL 1 MG/ML IJ SOLN
0.5000 mg | INTRAMUSCULAR | Status: DC | PRN
Start: 2024-08-09 — End: 2024-08-10

## 2024-08-09 MED ORDER — ACETAMINOPHEN 325 MG PO TABS: 650.0000 mg | ORAL_TABLET | ORAL | Status: DC | PRN

## 2024-08-09 MED ORDER — ASPIRIN 81 MG PO TBEC
81.0000 mg | DELAYED_RELEASE_TABLET | Freq: Every day | ORAL | Status: DC
Start: 2024-08-10 — End: 2024-08-12
  Administered 2024-08-10 – 2024-08-12 (×3): 81 mg via ORAL
  Filled 2024-08-09 (×3): qty 1

## 2024-08-09 MED ORDER — LIDOCAINE VISCOUS HCL 2 % MT SOLN
15.0000 mL | Freq: Once | OROMUCOSAL | Status: AC
  Administered 2024-08-09: 15 mL via ORAL
  Filled 2024-08-09: qty 15

## 2024-08-09 MED ORDER — ALUM & MAG HYDROXIDE-SIMETH 200-200-20 MG/5ML PO SUSP
30.0000 mL | Freq: Once | ORAL | Status: AC
  Administered 2024-08-09: 30 mL via ORAL
  Filled 2024-08-09: qty 30

## 2024-08-09 MED ORDER — PROCHLORPERAZINE EDISYLATE 10 MG/2ML IJ SOLN: 5.0000 mg | Freq: Four times a day (QID) | INTRAMUSCULAR | Status: DC | PRN

## 2024-08-09 MED ORDER — INSULIN ASPART 100 UNIT/ML IJ SOLN
0.0000 [IU] | INTRAMUSCULAR | Status: DC
  Administered 2024-08-10 – 2024-08-11 (×2): 1 [IU] via SUBCUTANEOUS

## 2024-08-09 MED ORDER — FLUTICASONE FUROATE-VILANTEROL 200-25 MCG/ACT IN AEPB
1.0000 | INHALATION_SPRAY | Freq: Every day | RESPIRATORY_TRACT | Status: DC
Start: 2024-08-10 — End: 2024-08-12
  Administered 2024-08-10 – 2024-08-12 (×3): 1 via RESPIRATORY_TRACT
  Filled 2024-08-09: qty 28

## 2024-08-09 MED ORDER — TAMSULOSIN HCL 0.4 MG PO CAPS
0.8000 mg | ORAL_CAPSULE | Freq: Every evening | ORAL | Status: DC
  Administered 2024-08-10 – 2024-08-11 (×2): 0.8 mg via ORAL
  Filled 2024-08-09 (×2): qty 2

## 2024-08-09 MED ORDER — LAMOTRIGINE 100 MG PO TABS
100.0000 mg | ORAL_TABLET | Freq: Two times a day (BID) | ORAL | Status: DC
  Administered 2024-08-10 – 2024-08-12 (×6): 100 mg via ORAL
  Filled 2024-08-09 (×3): qty 1
  Filled 2024-08-09: qty 4
  Filled 2024-08-09 (×2): qty 1

## 2024-08-09 MED ORDER — OXYCODONE HCL 5 MG PO TABS
5.0000 mg | ORAL_TABLET | ORAL | Status: DC | PRN
  Administered 2024-08-10: 5 mg via ORAL
  Filled 2024-08-09: qty 1

## 2024-08-09 NOTE — Progress Notes (Signed)
 PHARMACY - ANTICOAGULATION CONSULT NOTE  Pharmacy Consult for heparin  Indication: chest pain/ACS  Allergies  Allergen Reactions   Ceftriaxone  Anaphylaxis    (Rocephin ) Previously tolerated at [facility] during multiple admissions. Patient reports SOB reaction in [place], Summer 2024. Now reporting SOB reaction at [facility] 02/21/24. No swelling, or skin reaction. Satted well on baseline oxygen. Does not wish to receive Ceftriaxone  in the future.   Ace Inhibitors Swelling and Cough   Chlorhexidine     Haloperidol  And Related     Do NOT give anti-psychotics due to risk of  Torsades and QT prolongation (per Dr. Trude request)   Other Itching    [name]   Pork Allergy Other (See Comments)    Not listed in [facility]   Shellfish Allergy     Listed on MAR   Penicillins Itching and Rash    Tolerated ancef  (12-17-22)    Patient Measurements: Height: 5' 9 (175.3 cm) Weight: 107 kg (235 lb 14.3 oz) IBW/kg (Calculated) : 70.7 HEPARIN  DW (KG): 94  Vital Signs: Temp: 98.2 F (36.8 C) (09/30 1941) Temp Source: Oral (09/30 1941) BP: 145/66 (09/30 2235) Pulse Rate: 51 (09/30 2235)  Labs: Recent Labs    08/09/24 2112  HGB 10.1*  HCT 29.7*  PLT 191  CREATININE 2.51*    Estimated Creatinine Clearance: 37.2 mL/min (A) (by C-G formula based on SCr of 2.51 mg/dL (H)).   Medical History: Past Medical History:  Diagnosis Date   Acute on chronic combined systolic and diastolic CHF (congestive heart failure) (HCC) 03/05/2020   Acute on chronic heart failure with preserved ejection fraction (HFpEF) (HCC) 03/27/2023   Anemia    Arthritis    CAD (coronary artery disease)    a. s/p CABG in 03/2020 with LIMA-LAD, RIMA-PL, RA-D1-RI-OM1   Cancer (HCC)    rectal   Cellulitis    COPD (chronic obstructive pulmonary disease) (HCC)    Depression    Diabetes mellitus without complication (HCC)    Encounter for HIV pre-exposure prophylaxis 07/25/2024   GERD (gastroesophageal reflux  disease)    History of kidney stones    Hyperlipidemia 12/01/2019   Hypertension    Hypothyroidism    Ischemic cardiomyopathy    a. EF 20-25% by echo in 02/2020 b. at 40% by echo in 03/2020 c. EF normalized to 60-65% by echo in 04/2020   Myocardial infarction [facility])    Peripheral vascular disease    Sleep apnea    Type 2 diabetes mellitus (HCC)     Assessment: 63yo male c/o constipation and abdominal pain associated w/ N/V, during w/u troponin found to be elevated >> to begin heparin .  Goal of Therapy:  Heparin  level 0.3-0.7 units/ml Monitor platelets by anticoagulation protocol: Yes   Plan:  Heparin  2000 units IV bolus followed by infusion at 1300 units/hr. Monitor heparin  levels and CBC.  Marvetta Dauphin, PharmD, BCPS  08/09/2024,11:09 PM

## 2024-08-09 NOTE — ED Triage Notes (Signed)
 Pt c/o upper abd pain for months. Pt states he is constipated.

## 2024-08-09 NOTE — ED Notes (Signed)
 ED Provider at bedside.

## 2024-08-09 NOTE — ED Provider Notes (Signed)
 [facility] AT [facility] Provider Note   CSN: [id number] Arrival date & time: 08/09/24  8064     Patient presents with: Abdominal Pain   Shane Chambers is a 63 y.o. male.    Abdominal Pain Associated symptoms: nausea and vomiting   Patient presents for abdominal pain.  Medical history includes HTN, BPH, anemia, depression, COPD, CAD, GERD, constipation, CKD, CHF, DM, rectal cancer s/p resection.  He reports intermittent right upper quadrant abdominal pain over the past several months.  Over the past Avril days, this pain has become constant and increased in severity.  He has had some nausea.  2 days ago, he had some vomiting.  He describes vomitus as yellow in color.  Patient has chronic constipation.  He takes Amitiza  daily.  At his facility, he has as needed stool softeners prescribed but states that he typically does not get these because staff does not want him having diarrhea.  His last bowel movement was several days ago.  Currently, his right upper lobe pain is 8/10 in severity.  He does endorse current nausea.  He does wear oxygen and ambulates with a walker at baseline.     Prior to Admission medications   Medication Sig Start Date End Date Taking? Authorizing Provider  aspirin  81 MG EC tablet Take 1 tablet (81 mg total) by mouth daily with breakfast. 11/29/20   Pearlean Manus, MD  atorvastatin  (LIPITOR ) 40 MG tablet Take 1 tablet (40 mg total) by mouth at bedtime. 11/28/22   Von Bellis, MD  cabotegravir  ER (APRETUDE ) 600 MG/3ML injection Inject 3 mLs (600 mg total) into the muscle every 30 (thirty) days. 07/27/24   Kuppelweiser, Cassie L, RPH-CPP  carvedilol  (COREG ) 25 MG tablet Take 1 tablet (25 mg total) by mouth 2 (two) times daily with a meal. 04/02/23   Drusilla Sabas RAMAN, MD  ciprofloxacin  (CILOXAN ) 0.3 % ophthalmic solution  07/18/24   [provider]  ferrous gluconate  (FERGON) 324 MG tablet Take 324 mg by mouth daily.    [provider]  fluticasone  (FLONASE ) 50 MCG/ACT nasal spray Place into both nostrils. 07/13/24   [provider]  fluticasone -salmeterol (ADVAIR) 500-50 MCG/ACT AEPB Inhale 1 puff into the lungs in the morning and at bedtime.    [provider]  gabapentin  (NEURONTIN ) 600 MG tablet Take 600 mg by mouth 2 (two) times daily. 05/24/24   [provider]  hydrALAZINE  (APRESOLINE ) 100 MG tablet Take 1 tablet (100 mg total) by mouth 2 (two) times daily. 10/31/23   Ricky Fines, MD  HYDROcodone -acetaminophen  (NORCO/VICODIN) 5-325 MG tablet Take by mouth. 04/25/24   [provider]  insulin  aspart (NOVOLOG  FLEXPEN) 100 UNIT/ML FlexPen 0-15 Units, Subcutaneous, 3 times daily with meals CBG < 70: implement hypoglycemia protocol-call MD CBG 70 - 120: 0 units CBG 121 - 150: 2 units CBG 151 - 200: 3 units CBG 201 - 250: 5 units CBG 251 - 300: 8 units CBG 301 - 350: 11 units CBG 351 - 400: 15 units CBG > 400: 10/17/22   Ghimire, Donalda HERO, MD  lactulose  (CHRONULAC ) 10 GM/15ML solution Take 15 mLs (10 g total) by mouth 2 (two) times daily as needed for mild constipation. 06/29/24   Shirlean Therisa ORN, NP  lamoTRIgine (LAMICTAL) 100 MG tablet Take 100 mg by mouth 2 (two) times daily. 06/15/24   [provider]  levothyroxine  (SYNTHROID ) 100 MCG tablet Take 100 mcg by mouth daily. 05/30/24   [provider]  lubiprostone  (AMITIZA ) 24 MCG capsule Take 1 capsule (24 mcg total) by mouth 2 (two) times daily with a meal. 06/29/24   Shirlean Therisa ORN, NP  lubiprostone  (AMITIZA ) 8 MCG capsule Take 1 capsule (8 mcg total) by mouth 2 (two) times daily with a meal. 09/29/23   Shirlean Therisa ORN, NP  Luliconazole 1 % CREA Apply topically. 04/21/24   [provider]  metolazone  (ZAROXOLYN ) 2.5 MG tablet Take 1 tablet (2.5 mg total) by mouth once a week. On Wednesdays 10/31/23   Ricky Fines, MD  pantoprazole  (PROTONIX ) 40 MG tablet Take 1 tablet (40 mg total) by mouth daily.  Before breakfast 06/29/24   Shirlean Therisa ORN, NP  polyvinyl alcohol  (LIQUIFILM TEARS) 1.4 % ophthalmic solution Place 1 drop into both eyes daily.    [provider]  potassium chloride  (KLOR-CON ) 10 MEQ tablet Take 10 mEq by mouth daily.    [provider]  sodium bicarbonate  325 MG tablet Take 325 mg by mouth 2 (two) times daily.    [provider]  tamsulosin  (FLOMAX ) 0.4 MG CAPS capsule Take 0.8 mg by mouth every evening.    [provider]  thiamine  (VITAMIN B-1) 100 MG tablet Take 1 tablet (100 mg total) by mouth daily. 10/18/22   Ghimire, Donalda HERO, MD  torsemide  (DEMADEX ) 20 MG tablet Take 2 tablets (40 mg total) by mouth 2 (two) times daily. 10/31/23   Ricky Fines, MD  traZODone  (DESYREL ) 100 MG tablet Take 200 mg by mouth at bedtime. 01/19/24   [provider]    Allergies: Ceftriaxone , Ace inhibitors, Chlorhexidine , Haloperidol  and related, Other, Pork allergy, Shellfish allergy, and Penicillins    Review of Systems  Gastrointestinal:  Positive for abdominal pain, nausea and vomiting.  All other systems reviewed and are negative.   Updated Vital Signs BP (!) 145/66   Pulse (!) 51   Temp 98 F (36.7 C)   Resp 16   Ht 5' 9 (1.753 m)   Wt 107 kg   SpO2 99%   BMI 34.84 kg/m   Physical Exam Vitals and nursing note reviewed.  Constitutional:      General: He is not in acute distress.    Appearance: He is well-developed. He is not ill-appearing, toxic-appearing or diaphoretic.  HENT:     Head: Normocephalic and atraumatic.     Mouth/Throat:     Mouth: Mucous membranes are moist.  Eyes:     Conjunctiva/sclera: Conjunctivae normal.  Cardiovascular:     Rate and Rhythm: Normal rate and regular rhythm.  Pulmonary:     Effort: Pulmonary effort is normal. No respiratory distress.  Abdominal:     Palpations: Abdomen is soft.     Tenderness: There is abdominal tenderness in the right upper quadrant. There is no guarding or rebound.   Musculoskeletal:        General: No swelling.     Cervical back: Neck supple.  Skin:    General: Skin is warm and dry.     Coloration: Skin is not jaundiced or pale.  Neurological:     General: No focal deficit present.     Mental Status: He is alert and oriented to person, place, and time.  Psychiatric:        Mood and Affect: Mood normal.        Behavior: Behavior normal.     (all labs ordered are listed, but only abnormal results are displayed) Labs Reviewed  COMPREHENSIVE METABOLIC PANEL WITH GFR -  Abnormal; Notable for the following components:      Result Value   Sodium 134 (*)    Chloride 92 (*)    Glucose, Bld 112 (*)    BUN 107 (*)    Creatinine, Ser 2.51 (*)    GFR, Estimated 28 (*)    All other components within normal limits  LIPASE, BLOOD - Abnormal; Notable for the following components:   Lipase 112 (*)    All other components within normal limits  CBC WITH DIFFERENTIAL/PLATELET - Abnormal; Notable for the following components:   RBC 3.21 (*)    Hemoglobin 10.1 (*)    HCT 29.7 (*)    All other components within normal limits  URINALYSIS, ROUTINE W REFLEX MICROSCOPIC - Abnormal; Notable for the following components:   APPearance HAZY (*)    Protein, ur 100 (*)    Leukocytes,Ua MODERATE (*)    Bacteria, UA RARE (*)    All other components within normal limits  TROPONIN T, HIGH SENSITIVITY - Abnormal; Notable for the following components:   Troponin T High Sensitivity 274 (*)    All other components within normal limits  LACTIC ACID, PLASMA  MAGNESIUM   HEPARIN  LEVEL (UNFRACTIONATED)  CBC  TROPONIN T, HIGH SENSITIVITY    EKG: EKG Interpretation Date/Time:  Tuesday August 09 2024 19:44:06 EDT Ventricular Rate:  54 PR Interval:  205 QRS Duration:  113 QT Interval:  533 QTC Calculation: 506 R Axis:   5  Text Interpretation: Sinus rhythm Inferior infarct, old Prolonged QT interval Confirmed by Melvenia Motto [phone]) on 08/09/2024 10:26:29  PM  Radiology: CT ABDOMEN PELVIS WO CONTRAST Result Date: 08/09/2024 CLINICAL DATA:  Upper abdominal pain EXAM: CT ABDOMEN AND PELVIS WITHOUT CONTRAST TECHNIQUE: Multidetector CT imaging of the abdomen and pelvis was performed following the standard protocol without IV contrast. RADIATION DOSE REDUCTION: This exam was performed according to the departmental dose-optimization program which includes automated exposure control, adjustment of the mA and/or kV according to patient size and/or use of iterative reconstruction technique. COMPARISON:  CT 07/30/2023 FINDINGS: Lower chest: Minimal clustered nodularity at the lateral right upper lobe inferiorly. Bandlike atelectasis at the right middle lobe and left upper lobe posteriorly. No acute airspace disease. Multi-vessel coronary vascular calcification. Hepatobiliary: Gallstones. No focal hepatic abnormality or biliary dilatation Pancreas: Unremarkable. No pancreatic ductal dilatation or surrounding inflammatory changes. Spleen: Splenic granuloma Adrenals/Urinary Tract: Adrenal glands are normal. Nonspecific perinephric stranding. No hydronephrosis. Suprapubic catheter within the bladder which is decompressed. The bladder appears diffusely thick walled. Stomach/Bowel: Stomach within normal limits. No dilated small bowel. No acute bowel wall thickening. Moderate stool in the sigmoid colon and rectum. Vascular/Lymphatic: Aortic atherosclerosis. No enlarged abdominal or pelvic lymph nodes. Reproductive: Negative prostate Other: Negative for pelvic effusion or free air Musculoskeletal: No acute osseous abnormality. Multilevel degenerative change IMPRESSION: 1. No CT evidence for acute intra-abdominal or pelvic abnormality. 2. Gallstones. 3. Suprapubic catheter within the bladder which is decompressed. The bladder appears diffusely thick walled, correlate for cystitis 4. Moderate stool in the sigmoid colon and rectum. 5. Aortic atherosclerosis. Aortic Atherosclerosis  (ICD10-I70.0). Electronically Signed   By: Luke Bun M.D.   On: 08/09/2024 22:11     Procedures   Medications Ordered in the ED  nitroGLYCERIN  (NITROGLYN) 2 % ointment 1 inch (has no administration in time range)  heparin  bolus via infusion 2,000 Units (has no administration in time range)  heparin  ADULT infusion 100 units/mL (25000 units/250mL) (has no administration in time range)  HYDROmorphone  (DILAUDID )  injection 0.5 mg (has no administration in time range)  HYDROmorphone  (DILAUDID ) injection 0.5 mg (0.5 mg Intravenous Given 08/09/24 2117)  metoCLOPramide  (REGLAN ) injection 10 mg (10 mg Intravenous Given 08/09/24 2119)  lactated ringers  bolus 500 mL (0 mLs Intravenous Stopped 08/09/24 2232)  alum & mag hydroxide-simeth (MAALOX/MYLANTA) 200-200-20 MG/5ML suspension 30 mL (30 mLs Oral Given 08/09/24 2119)    And  lidocaine  (XYLOCAINE ) 2 % viscous mouth solution 15 mL (15 mLs Oral Given 08/09/24 2119)  aspirin  chewable tablet 324 mg (324 mg Oral Given 08/09/24 2238)                                    Medical Decision Making Amount and/or Complexity of Data Reviewed Labs: ordered. Radiology: ordered.  Risk OTC drugs. Prescription drug management. Decision regarding hospitalization.   This patient presents to the ED for concern of right upper quadrant pain, this involves an extensive number of treatment options, and is a complaint that carries with it a high risk of complications and morbidity.  The differential diagnosis includes cholecystitis, cholelithiasis, pancreatitis, PUD, ACS, colitis   Co morbidities / Chronic conditions that complicate the patient evaluation  HTN, BPH, anemia, depression, COPD, CAD, GERD, constipation, CKD, CHF, DM, rectal cancer s/p resection   Additional history obtained:  Additional history obtained from EMR External records from outside source obtained and reviewed including N/A   Lab Tests:  I Ordered, and personally interpreted labs.   The pertinent results include: Baseline CKD, normal electrolytes, mild elevation in lipase but not consistent with pancreatitis, no leukocytosis, urinalysis consistent with chronic Foley colonization.  Troponins elevated concerning for NSTEMI.   Imaging Studies ordered:  I ordered imaging studies including CT of abdomen and pelvis I independently visualized and interpreted imaging which showed no acute findings I agree with the radiologist interpretation   Cardiac Monitoring: / EKG:  The patient was maintained on a cardiac monitor.  I personally viewed and interpreted the cardiac monitored which showed an underlying rhythm of: Sinus rhythm   Problem List / ED Course / Critical interventions / Medication management  Patient presenting for right upper quadrant abdominal pain.  Although this has been intermittent over the past several months, he reports increasing severity and constant pain over the past several days.  On arrival in the ED, vital signs notable for mild bradycardia.  He arrives on supplemental oxygen which is baseline for him.  On exam, he does have some right a quadrant tenderness without guarding.  He reports current pain of 8/10 with ongoing nausea.  He has a suprapubic catheter.  This was reportedly placed earlier this year.  He states that it was last exchanged 2 to 3 weeks ago.  Medications ordered for symptomatic relief.  Workup was initiated.  Per chart review, patient underwent EGD 2 weeks ago.  Results showed gastritis and white nummular lesions in esophageal mucosa.  GI cocktail was ordered.  Patient's lab work notable for troponin of 274.  He underwent CT imaging which did not show any acute findings.  Given workup results, presentation concerning for NSTEMI.  What he describes right upper quadrant pain may be cardiac in etiology.  ASA and heparin  were ordered.  On reassessment, patient reports ongoing 7/10 severity pain.  NTG and additional Dilaudid  were ordered.  I spoke  with cardiologist on-call, Dr. Orlando, who recommends hospitalist admission to [facility].  He agrees with heparin  GTT.  Patient was admitted for further management. I ordered medication including Dilaudid , GI cocktail for analgesia, Reglan  for nausea, NTG, ASA and heparin  for ACS Reevaluation of the patient after these medicines showed that the patient improved I have reviewed the patients home medicines and have made adjustments as needed   Consultations Obtained:  I requested consultation with the cardiologist, Dr. Orlando,  and discussed lab and imaging findings as well as pertinent plan - they recommend: Hospitalist admission to [facility], ASA and heparin    Social Determinants of Health:  Resides in nursing facility  CRITICAL CARE Performed by: Bernardino Fireman   Total critical care time: 32 minutes  Critical care time was exclusive of separately billable procedures and treating other patients.  Critical care was necessary to treat or prevent imminent or life-threatening deterioration.  Critical care was time spent personally by me on the following activities: development of treatment plan with patient and/or surrogate as well as nursing, discussions with consultants, evaluation of patient's response to treatment, examination of patient, obtaining history from patient or surrogate, ordering and performing treatments and interventions, ordering and review of laboratory studies, ordering and review of radiographic studies, pulse oximetry and re-evaluation of patient's condition.     Final diagnoses:  NSTEMI (non-ST elevated myocardial infarction) [facility])    ED Discharge Orders     None          Fireman Bernardino, MD 08/09/24 2341

## 2024-08-09 NOTE — H&P (Signed)
 Shane Chambers

## 2024-08-10 ENCOUNTER — Inpatient Hospital Stay (HOSPITAL_COMMUNITY)

## 2024-08-10 DIAGNOSIS — R7989 Other specified abnormal findings of blood chemistry: Secondary | ICD-10-CM

## 2024-08-10 DIAGNOSIS — E785 Hyperlipidemia, unspecified: Secondary | ICD-10-CM

## 2024-08-10 DIAGNOSIS — N184 Chronic kidney disease, stage 4 (severe): Secondary | ICD-10-CM | POA: Diagnosis not present

## 2024-08-10 DIAGNOSIS — R1011 Right upper quadrant pain: Secondary | ICD-10-CM | POA: Diagnosis present

## 2024-08-10 DIAGNOSIS — I2510 Atherosclerotic heart disease of native coronary artery without angina pectoris: Secondary | ICD-10-CM

## 2024-08-10 DIAGNOSIS — N390 Urinary tract infection, site not specified: Secondary | ICD-10-CM | POA: Diagnosis present

## 2024-08-10 DIAGNOSIS — I10 Essential (primary) hypertension: Secondary | ICD-10-CM

## 2024-08-10 DIAGNOSIS — F323 Major depressive disorder, single episode, severe with psychotic features: Secondary | ICD-10-CM | POA: Diagnosis not present

## 2024-08-10 DIAGNOSIS — E119 Type 2 diabetes mellitus without complications: Secondary | ICD-10-CM

## 2024-08-10 DIAGNOSIS — N189 Chronic kidney disease, unspecified: Secondary | ICD-10-CM

## 2024-08-10 LAB — BASIC METABOLIC PANEL WITH GFR
Anion gap: 12 (ref 5–15)
BUN: 105 mg/dL — ABNORMAL HIGH (ref 8–23)
CO2: 30 mmol/L (ref 22–32)
Calcium: 9.1 mg/dL (ref 8.9–10.3)
Chloride: 90 mmol/L — ABNORMAL LOW (ref 98–111)
Creatinine, Ser: 2.7 mg/dL — ABNORMAL HIGH (ref 0.61–1.24)
GFR, Estimated: 26 mL/min — ABNORMAL LOW (ref >60)
Glucose, Bld: 187 mg/dL — ABNORMAL HIGH (ref 70–99)
Potassium: 3.7 mmol/L (ref 3.5–5.1)
Sodium: 132 mmol/L — ABNORMAL LOW (ref 135–145)

## 2024-08-10 LAB — CBC
HCT: 28.3 % — ABNORMAL LOW (ref 39.0–52.0)
Hemoglobin: 9.5 g/dL — ABNORMAL LOW (ref 13.0–17.0)
MCH: 30.7 pg (ref 26.0–34.0)
MCHC: 33.6 g/dL (ref 30.0–36.0)
MCV: 91.6 fL (ref 80.0–100.0)
Platelets: 167 K/uL (ref 150–400)
RBC: 3.09 MIL/uL — ABNORMAL LOW (ref 4.22–5.81)
RDW: 12.9 % (ref 11.5–15.5)
WBC: 10.2 K/uL (ref 4.0–10.5)
nRBC: 0 % (ref 0.0–0.2)

## 2024-08-10 LAB — GLUCOSE, CAPILLARY
Glucose-Capillary: 113 mg/dL — ABNORMAL HIGH (ref 70–99)
Glucose-Capillary: 103 mg/dL — ABNORMAL HIGH (ref 70–99)
Glucose-Capillary: 177 mg/dL — ABNORMAL HIGH (ref 70–99)
Glucose-Capillary: 126 mg/dL — ABNORMAL HIGH (ref 70–99)
Glucose-Capillary: 116 mg/dL — ABNORMAL HIGH (ref 70–99)

## 2024-08-10 LAB — MRSA NEXT GEN BY PCR, NASAL: MRSA by PCR Next Gen: DETECTED — AB

## 2024-08-10 LAB — ECHOCARDIOGRAM COMPLETE
AR max vel: 1.55 cm2
AV Area VTI: 1.59 cm2
AV Area mean vel: 1.59 cm2
AV Mean grad: 12 mmHg
AV Peak grad: 23.6 mmHg
Ao pk vel: 2.43 m/s
Area-P 1/2: 3.21 cm2
Height: 69 in
S' Lateral: 3.51 cm
Weight: 3791.91 [oz_av]

## 2024-08-10 LAB — CBG MONITORING, ED: Glucose-Capillary: 109 mg/dL — ABNORMAL HIGH (ref 70–99)

## 2024-08-10 LAB — HEPARIN LEVEL (UNFRACTIONATED)
Heparin Unfractionated: 0.38 [IU]/mL (ref 0.30–0.70)
Heparin Unfractionated: 0.35 [IU]/mL (ref 0.30–0.70)

## 2024-08-10 MED ORDER — POLYETHYLENE GLYCOL 3350 17 G PO PACK
17.0000 g | PACK | Freq: Two times a day (BID) | ORAL | Status: DC
  Administered 2024-08-10 – 2024-08-12 (×3): 17 g via ORAL
  Filled 2024-08-10 (×4): qty 1

## 2024-08-10 MED ORDER — SENNOSIDES-DOCUSATE SODIUM 8.6-50 MG PO TABS
3.0000 | ORAL_TABLET | Freq: Two times a day (BID) | ORAL | Status: DC
  Administered 2024-08-10 – 2024-08-12 (×2): 3 via ORAL
  Filled 2024-08-10 (×3): qty 3

## 2024-08-10 MED ORDER — LACTULOSE 10 GM/15ML PO SOLN: 10.0000 g | Freq: Two times a day (BID) | ORAL | Status: DC | PRN

## 2024-08-10 MED ORDER — BISACODYL 10 MG RE SUPP
10.0000 mg | Freq: Once | RECTAL | Status: AC
  Administered 2024-08-10: 10 mg via RECTAL
  Filled 2024-08-10: qty 1

## 2024-08-10 MED ORDER — CHLORHEXIDINE GLUCONATE CLOTH 2 % EX PADS
6.0000 | MEDICATED_PAD | Freq: Every day | CUTANEOUS | Status: DC
  Administered 2024-08-10: 6 via TOPICAL

## 2024-08-10 MED ORDER — SULFAMETHOXAZOLE-TRIMETHOPRIM 400-80 MG PO TABS
1.0000 | ORAL_TABLET | Freq: Two times a day (BID) | ORAL | Status: DC
  Filled 2024-08-10: qty 1

## 2024-08-10 MED ORDER — SULFAMETHOXAZOLE-TRIMETHOPRIM 800-160 MG PO TABS
1.0000 | ORAL_TABLET | Freq: Once | ORAL | Status: AC
  Administered 2024-08-10: 1 via ORAL
  Filled 2024-08-10: qty 1

## 2024-08-10 MED ORDER — BISACODYL 5 MG PO TBEC
10.0000 mg | DELAYED_RELEASE_TABLET | Freq: Every day | ORAL | Status: DC
Start: 2024-08-10 — End: 2024-08-12
  Administered 2024-08-10 – 2024-08-12 (×2): 10 mg via ORAL
  Filled 2024-08-10 (×3): qty 2

## 2024-08-10 MED ORDER — MUPIROCIN 2 % EX OINT
1.0000 | TOPICAL_OINTMENT | Freq: Two times a day (BID) | CUTANEOUS | Status: DC
  Administered 2024-08-10 – 2024-08-12 (×5): 1 via NASAL
  Filled 2024-08-10 (×2): qty 22

## 2024-08-10 NOTE — Progress Notes (Addendum)
 Progress Note  Patient Name: Shane Chambers Date of Encounter: 08/10/2024 [facility] Cardiologist: Alvan Carrier, MD   Interval Summary    Main complaint is RUQ pain, nausea. Some right sided chest pain but this seems chronic in nature & and in no way similar to his prior anginal equivalent symptoms.  Vital Signs Vitals:   08/10/24 0201 08/10/24 0304 08/10/24 0832 08/10/24 0849  BP: (!) 164/70 (!) 155/70 (!) 97/53 106/76  Pulse: (!) 50 (!) 56 (!) 47 (!) 52  Resp: 15 17 12 16   Temp: 98 F (36.7 C) 97.8 F (36.6 C) 97.6 F (36.4 C) 97.8 F (36.6 C)  TempSrc:  Oral Oral Oral  SpO2: 99% 100% 100% 100%  Weight:  107.5 kg    Height:  5' 9 (1.753 m)      Intake/Output Summary (Last 24 hours) at 08/10/2024 1055 Last data filed at 08/10/2024 0904 Gross per 24 hour  Intake 758.73 ml  Output 500 ml  Net 258.73 ml      08/10/2024    3:04 AM 08/09/2024    7:40 PM 07/22/2024    8:03 AM  Last 3 Weights  Weight (lbs) 236 lb 15.9 oz 235 lb 14.3 oz 236 lb  Weight (kg) 107.5 kg 107 kg 107.049 kg      Bedside review echocardiogram suggest relatively normal EF with no obvious WMA.  Telemetry/ECG  Sinus Bradycardia - Personally Reviewed  Physical Exam  GEN: No acute distress.   Neck: No JVD Cardiac: RRR, + systolic murmur RUSB, no rubs, or gallops.  Respiratory: Clear to auscultation bilaterally. GI: Soft, RUQ tenderness to palpation but otherwise nondistended.  No rebound MS: No edema  Assessment & Plan   63 y.o. male with a hx of CAD s/p CABG x5 in 2021 (LIMA-LAD, RIMA -PL, Lradial- OM1-RI-D1), OSA, chronic hypoxic resp failure, HFpEF, rectal cancer s/p resection, CKD4, htn. Hld, DM, hypothyroidism, depression, and neurogenic bladder who was seen 08/10/2024 for the evaluation of elevated troponin at the request of Dr. Melvenia.   Elevated troponin CAD s/p 5vCABG '21 -- hsTn 274>>265, EKG showed sinus bradycardia with slight J point elevation in lead II and  prolonged QT interval  -- main complaint is RUQ pain as well as nausea for the past couple of days -- reports this is very different than what he experienced with previous anginal -- given his elevated Cr, low level troponin and nonischemic EKG would not anticipate further ischemic work up at this time. Low suspicion for ACS  -- continue ASA, statin, BB  Would not recommend further ischemic evaluation at this point unless the echocardiogram shows grossly abnormal findings.  I suspect the troponin elevation is demand ischemia and exacerbated by his renal insufficiency.  Flat trend is less likely be ACS.  Especially, in the setting of no symptoms of chest pain.  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, has not had a bowel movement in 3 days  Chronic HFpEF -- does not appear volume overloaded on exam  HTN -- blood pressures elevated on admission, now soft this morning -- on coreg  25mg  BID, hydralazine  100mg  BID PTA  HLD -- on statin   Per Primary Rectal CA Neurogenic Bladder Hypothyroidism Chronic hypoxic respiratory failure  For questions or updates, please contact [facility] Please consult [email or web address] for contact info under        Signed, Manuelita Rummer, NP    ATTENDING  ATTESTATION  I have seen, examined and evaluated the patient this morning on rounds along with Manuelita Rummer, NP.  After reviewing all the available data and chart, we discussed the patients laboratory, study & physical findings as well as symptoms in detail.  I agree with her findings, examination as well as impression recommendations as per our discussion.    Attending adjustments noted in italics.   Agree with findings.  Story does not sound consistent with ACS.  He is having right upper quadrant symptoms with no left-sided chest discomfort.  Troponin elevation is flat if not downtrending but the clinical scenario is not  consistent with angina.  He is not complaining of significant left thigh discomfort or dyspnea.  He is essentially a very sedentary nursing home resident who is not active enough to know if he is having symptoms.  Pending the echocardiogram results unless there is something grossly abnormal with new wall motion abnormalities, I would recommend continue to evaluate the right upper quadrant pain as a noncardiac etiology.  Will follow along until the echocardiograms been completed and read.    Alm MICAEL Clay, MD, MS Alm Clay, M.D., M.S. Interventional Cardiologist  [facility] Pager # [phone]

## 2024-08-10 NOTE — [place] FL2 (Signed)
 [place]  MEDICAID FL2 LEVEL OF CARE FORM     IDENTIFICATION  Patient Name: Shane Chambers Birthdate: 1961-03-14 Sex: male Admission Date (Current Location): 08/09/2024  [place] and [place] Number:  [place] [id number] S Facility and Address:  [facility], [address] [place], [place] [address]      Provider Number: [id number]  Attending Physician Name and Address:  Will Almarie MATSU, MD  Relative Name and Phone Number:  ROLONDO, PIERRE (Mother)  [phone] [facility] Phone)    Current Level of Care: Hospital Recommended Level of Care: Skilled Nursing Facility Prior Approval Number:    Date Approved/Denied:   PASRR Number: [id number]  Discharge Plan: SNF    Current Diagnoses: Patient Active Problem List   Diagnosis Date Noted   RUQ abdominal pain 08/10/2024   UTI (urinary tract infection) 08/10/2024   NSTEMI (non-ST elevated myocardial infarction) (HCC) 08/09/2024   Encounter for HIV pre-exposure prophylaxis 07/25/2024   Dysphagia 06/29/2024   Gastroesophageal reflux disease 06/29/2024   Allergy to cephalosporin 02/22/2024   Septic joint (HCC) 02/20/2024   Chronic retention of urine 02/09/2024   Neurogenic bladder 02/09/2024   Suprapubic catheter (HCC) 02/09/2024   Ambulatory dysfunction 02/09/2024   Medically complex patient 02/09/2024   Behavior concern in adult 12/16/2023   Anemia of chronic renal failure 12/08/2023   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   Type II diabetes mellitus with renal manifestations (HCC) 07/30/2023   Chronic diastolic CHF (congestive heart failure) (HCC) 07/30/2023   Dyslipidemia 03/27/2023   Hypothyroidism 03/27/2023   CKD (chronic kidney disease), stage IV (HCC) 03/27/2023   Moderate aortic stenosis 03/27/2023   Hyperkalemia 02/15/2023   Pulmonary edema 02/15/2023   Bilateral pleural effusion 02/15/2023   Nausea & vomiting 02/15/2023   Chronic diarrhea 02/15/2023   High anion gap metabolic acidosis 02/14/2023    S/P BKA (below knee amputation) unilateral, right (HCC) 12/17/2022   MRSA infection 11/19/2022   Amputation stump infection (HCC) 11/19/2022   QT prolongation 11/10/2022   Severe recurrent major depression without psychotic features (HCC) 11/05/2022   Major depressive disorder with psychotic features (HCC) 11/02/2022   Acute osteomyelitis (HCC) 10/02/2022   Subacute osteomyelitis, right ankle and foot (HCC) 10/02/2022   Hypoalbuminemia due to protein-calorie malnutrition 10/02/2022   Cellulitis of right lower extremity 09/25/2022   Morbid obesity (HCC) 04/23/2022   Elevated troponin 04/22/2022   Cancer of sigmoid colon (HCC) 12/16/2021   Constipation 12/16/2021   GERD without esophagitis 12/16/2021   Iron  deficiency anemia 09/17/2021   Rectal cancer (HCC) 09/03/2021   Bilateral lower leg cellulitis 11/22/2020   Systolic and diastolic CHF, chronic (HCC) 11/22/2020   OSA (obstructive sleep apnea) 05/24/2020   COPD (chronic obstructive pulmonary disease) (HCC) 04/29/2020   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   Ischemic cardiomyopathy    Coronary artery disease    Severe Vitamin D  deficiency 12/02/2019   Hypokalemia 12/01/2019   BPH (benign prostatic hyperplasia) 12/01/2019   Normocytic anemia 12/01/2019   Chronic respiratory failure with hypoxia (HCC)    SOB (shortness of breath) 11/16/2019   Essential hypertension    Depression     Orientation RESPIRATION BLADDER Height & Weight     Self, Time, Situation, Place  Normal Continent, Indwelling catheter Weight: 236 lb 15.9 oz (107.5 kg) Height:  5' 9 (175.3 cm)  BEHAVIORAL SYMPTOMS/MOOD NEUROLOGICAL BOWEL NUTRITION STATUS      Continent Diet (Please see discharge summary)  AMBULATORY STATUS COMMUNICATION OF NEEDS Skin     Verbally Normal  Personal Care Assistance Level of Assistance  Bathing, Feeding, Dressing Bathing Assistance: Maximum assistance Feeding assistance: Limited assistance Dressing  Assistance: Maximum assistance     Functional Limitations Info  Sight, Speech, Hearing Sight Info: Impaired (R and L eye) Hearing Info: Adequate Speech Info: Adequate    SPECIAL CARE FACTORS FREQUENCY                       Contractures Contractures Info: Not present    Additional Factors Info  Code Status, Allergies, Psychotropic, Insulin  Sliding Scale Code Status Info: Full Allergies Info: Ceftriaxone , Ace Inhibitors, Chlorhexidine , Haloperidol  And Related, Other, Pork Allergy, Shellfish Allergy, Penicillins Psychotropic Info: gabapentin ,  Lamictal Insulin  Sliding Scale Info: Please see discharge summary       Current Medications (08/10/2024):  This is the current hospital active medication list Current Facility-Administered Medications  Medication Dose Route Frequency Provider Last Rate Last Admin   acetaminophen  (TYLENOL ) tablet 650 mg  650 mg Oral Q4H PRN Opyd, Timothy S, MD       aspirin  EC tablet 81 mg  81 mg Oral Daily Opyd, Timothy S, MD   81 mg at 08/10/24 0843   atorvastatin  (LIPITOR ) tablet 40 mg  40 mg Oral QHS Opyd, Timothy S, MD   40 mg at 08/10/24 0051   bisacodyl  (DULCOLAX) EC tablet 10 mg  10 mg Oral Daily Mathews, Elizabeth G, MD   10 mg at 08/10/24 1026   carvedilol  (COREG ) tablet 25 mg  25 mg Oral BID WC Opyd, Timothy S, MD       Chlorhexidine  Gluconate Cloth 2 % PADS 6 each  6 each Topical Daily Will Almarie MATSU, MD   6 each at 08/10/24 1026   fluticasone  furoate-vilanterol (BREO ELLIPTA ) 200-25 MCG/ACT 1 puff  1 puff Inhalation Daily Opyd, Timothy S, MD   1 puff at 08/10/24 1025   gabapentin  (NEURONTIN ) capsule 600 mg  600 mg Oral BID Opyd, Timothy S, MD   600 mg at 08/10/24 0844   heparin  ADULT infusion 100 units/mL (25000 units/250mL)  1,300 Units/hr Intravenous Continuous Opyd, Timothy S, MD 13 mL/hr at 08/10/24 0904 1,300 Units/hr at 08/10/24 9095   hydrALAZINE  (APRESOLINE ) tablet 100 mg  100 mg Oral BID Opyd, Timothy S, MD   100 mg at 08/10/24  0844   HYDROmorphone  (DILAUDID ) injection 0.5 mg  0.5 mg Intravenous Q4H PRN Opyd, Timothy S, MD       insulin  aspart (novoLOG ) injection 0-6 Units  0-6 Units Subcutaneous Q4H Opyd, Timothy S, MD       ipratropium-albuterol  (DUONEB) 0.5-2.5 (3) MG/3ML nebulizer solution 3 mL  3 mL Nebulization Q6H PRN Opyd, Evalene RAMAN, MD       lamoTRIgine (LAMICTAL) tablet 100 mg  100 mg Oral BID Opyd, Timothy S, MD   100 mg at 08/10/24 0844   levothyroxine  (SYNTHROID ) tablet 100 mcg  100 mcg Oral Daily Opyd, Timothy S, MD   100 mcg at 08/10/24 9349   lubiprostone  (AMITIZA ) capsule 24 mcg  24 mcg Oral BID WC Opyd, Timothy S, MD   24 mcg at 08/10/24 0844   mupirocin  ointment (BACTROBAN ) 2 % 1 Application  1 Application Nasal BID Will Almarie MATSU, MD   1 Application at 08/10/24 1026   oxyCODONE  (Oxy IR/ROXICODONE ) immediate release tablet 5 mg  5 mg Oral Q4H PRN Opyd, Timothy S, MD   5 mg at 08/10/24 0445   pantoprazole  (PROTONIX ) EC tablet 40 mg  40 mg Oral Daily Opyd,  Timothy S, MD   40 mg at 08/10/24 0844   polyethylene glycol (MIRALAX  / GLYCOLAX ) packet 17 g  17 g Oral BID Will Almarie MATSU, MD   17 g at 08/10/24 1026   prochlorperazine  (COMPAZINE ) injection 5 mg  5 mg Intravenous Q6H PRN Opyd, Timothy S, MD       senna-docusate (Senokot-S) tablet 3 tablet  3 tablet Oral BID Will Almarie MATSU, MD   3 tablet at 08/10/24 1026   sulfamethoxazole -trimethoprim  (BACTRIM ) 400-80 MG per tablet 1 tablet  1 tablet Oral Q12H Opyd, Timothy S, MD       tamsulosin  (FLOMAX ) capsule 0.8 mg  0.8 mg Oral QPM Opyd, Timothy S, MD       traZODone  (DESYREL ) tablet 200 mg  200 mg Oral QHS Opyd, Timothy S, MD   200 mg at 08/10/24 0049     Discharge Medications: Please see discharge summary for a list of discharge medications.  Relevant Imaging Results:  Relevant Lab Results:   Additional Information SSN: [id number]  Lynnwood Beckford C Reta Norgren, LCSWA

## 2024-08-10 NOTE — Progress Notes (Signed)
 PHARMACY - ANTICOAGULATION CONSULT NOTE  Pharmacy Consult for heparin  Indication: chest pain/ACS  Allergies  Allergen Reactions   Ceftriaxone  Anaphylaxis    (Rocephin ) Previously tolerated at [facility] during multiple admissions. Patient reports SOB reaction in [place], Summer 2024. Now reporting SOB reaction at [facility] 02/21/24. No swelling, or skin reaction. Satted well on baseline oxygen. Does not wish to receive Ceftriaxone  in the future.   Ace Inhibitors Swelling and Cough   Chlorhexidine     Haloperidol  And Related     Do NOT give anti-psychotics due to risk of  Torsades and QT prolongation (per Dr. Trude request)   Other Itching    [name]   Pork Allergy Other (See Comments)    Not listed in [facility]   Shellfish Allergy     Listed on MAR   Penicillins Itching and Rash    Tolerated ancef  (12-17-22)    Patient Measurements: Height: 5' 9 (175.3 cm) Weight: 107.5 kg (236 lb 15.9 oz) IBW/kg (Calculated) : 70.7 HEPARIN  DW (KG): 94.1  Vital Signs: Temp: 97.8 F (36.6 C) (10/01 1115) Temp Source: Oral (10/01 1115) BP: 128/64 (10/01 1115) Pulse Rate: 58 (10/01 1115)  Labs: Recent Labs    08/09/24 2112 08/10/24 1231  HGB 10.1* 9.5*  HCT 29.7* 28.3*  PLT 191 167  HEPARINUNFRC  --  0.38  CREATININE 2.51* 2.70*    Estimated Creatinine Clearance: 34.7 mL/min (A) (by C-G formula based on SCr of 2.7 mg/dL (H)).   Medical History: Past Medical History:  Diagnosis Date   Acute on chronic combined systolic and diastolic CHF (congestive heart failure) (HCC) 03/05/2020   Acute on chronic heart failure with preserved ejection fraction (HFpEF) (HCC) 03/27/2023   Anemia    Arthritis    CAD (coronary artery disease)    a. s/p CABG in 03/2020 with LIMA-LAD, RIMA-PL, RA-D1-RI-OM1   Cancer (HCC)    rectal   Cellulitis    COPD (chronic obstructive pulmonary disease) (HCC)    Depression    Diabetes mellitus without complication (HCC)    Encounter for HIV  pre-exposure prophylaxis 07/25/2024   GERD (gastroesophageal reflux disease)    History of kidney stones    Hyperlipidemia 12/01/2019   Hypertension    Hypothyroidism    Ischemic cardiomyopathy    a. EF 20-25% by echo in 02/2020 b. at 40% by echo in 03/2020 c. EF normalized to 60-65% by echo in 04/2020   Myocardial infarction [facility])    Peripheral vascular disease    Sleep apnea    Type 2 diabetes mellitus (HCC)     Assessment: 63yo male c/o constipation and abdominal pain associated w/ N/V, during w/u troponin found to be elevated on IV heparin .  -Initial heparin  level= 0.38 and at goal on 1300 units/hr  Goal of Therapy:  Heparin  level 0.3-0.7 units/ml Monitor platelets by anticoagulation protocol: Yes   Plan:  -Continue heparin  at 1300 units/hr -Confirm heparin  level later today -Daily heparin  level and CBC  Prentice Poisson, PharmD Clinical Pharmacist **Pharmacist phone directory can now be found on [email or web address] ([email or web address]).  Listed under [facility].

## 2024-08-10 NOTE — Progress Notes (Signed)
 PHARMACY - ANTICOAGULATION CONSULT NOTE  Pharmacy Consult for heparin  Indication: chest pain/ACS  Allergies  Allergen Reactions   Ceftriaxone  Anaphylaxis    (Rocephin ) Previously tolerated at [facility] during multiple admissions. Patient reports SOB reaction in [place], Summer 2024. Now reporting SOB reaction at [facility] 02/21/24. No swelling, or skin reaction. Satted well on baseline oxygen. Does not wish to receive Ceftriaxone  in the future.   Ace Inhibitors Swelling and Cough   Chlorhexidine     Haloperidol  And Related     Do NOT give anti-psychotics due to risk of  Torsades and QT prolongation (per Dr. Trude request)   Other Itching    [name]   Pork Allergy Other (See Comments)    Not listed in [facility]   Shellfish Allergy     Listed on MAR   Penicillins Itching and Rash    Tolerated ancef  (12-17-22)    Patient Measurements: Height: 5' 9 (175.3 cm) Weight: 107.5 kg (236 lb 15.9 oz) IBW/kg (Calculated) : 70.7 HEPARIN  DW (KG): 94.1  Vital Signs: Temp: 97.7 F (36.5 C) (10/01 1926) Temp Source: Oral (10/01 1926) BP: 147/67 (10/01 1926) Pulse Rate: 65 (10/01 1926)  Labs: Recent Labs    08/09/24 2112 08/10/24 1231 08/10/24 1932  HGB 10.1* 9.5*  --   HCT 29.7* 28.3*  --   PLT 191 167  --   HEPARINUNFRC  --  0.38 0.35  CREATININE 2.51* 2.70*  --     Estimated Creatinine Clearance: 34.7 mL/min (A) (by C-G formula based on SCr of 2.7 mg/dL (H)).   Medical History: Past Medical History:  Diagnosis Date   Acute on chronic combined systolic and diastolic CHF (congestive heart failure) (HCC) 03/05/2020   Acute on chronic heart failure with preserved ejection fraction (HFpEF) (HCC) 03/27/2023   Anemia    Arthritis    CAD (coronary artery disease)    a. s/p CABG in 03/2020 with LIMA-LAD, RIMA-PL, RA-D1-RI-OM1   Cancer (HCC)    rectal   Cellulitis    COPD (chronic obstructive pulmonary disease) (HCC)    Depression    Diabetes mellitus without complication  (HCC)    Encounter for HIV pre-exposure prophylaxis 07/25/2024   GERD (gastroesophageal reflux disease)    History of kidney stones    Hyperlipidemia 12/01/2019   Hypertension    Hypothyroidism    Ischemic cardiomyopathy    a. EF 20-25% by echo in 02/2020 b. at 40% by echo in 03/2020 c. EF normalized to 60-65% by echo in 04/2020   Myocardial infarction [facility])    Peripheral vascular disease    Sleep apnea    Type 2 diabetes mellitus (HCC)     Assessment: 63yo male c/o constipation and abdominal pain associated w/ N/V, during w/u troponin found to be elevated on IV heparin .  Confirmatory heparin  level therapeutic; no bleeding reported.  Goal of Therapy:  Heparin  level 0.3-0.7 units/ml Monitor platelets by anticoagulation protocol: Yes   Plan:  -Continue heparin  at 1300 units/hr -Daily heparin  level and CBC  Shane Chambers, PharmD, BCPS, BCCCP 08/10/2024, 9:39 PM

## 2024-08-10 NOTE — Plan of Care (Signed)
  Problem: Education: Goal: Ability to describe self-care measures that may prevent or decrease complications (Diabetes Survival Skills Education) will improve Outcome: Progressing   Problem: Coping: Goal: Ability to adjust to condition or change in health will improve Outcome: Progressing   Problem: Fluid Volume: Goal: Ability to maintain a balanced intake and output will improve Outcome: Progressing   Problem: Health Behavior/Discharge Planning: Goal: Ability to identify and utilize available resources and services will improve Outcome: Progressing Goal: Ability to manage health-related needs will improve Outcome: Progressing   Problem: Metabolic: Goal: Ability to maintain appropriate glucose levels will improve Outcome: Progressing   Problem: Nutritional: Goal: Maintenance of adequate nutrition will improve Outcome: Progressing Goal: Progress toward achieving an optimal weight will improve Outcome: Progressing   Problem: Skin Integrity: Goal: Risk for impaired skin integrity will decrease Outcome: Progressing   Problem: Tissue Perfusion: Goal: Adequacy of tissue perfusion will improve Outcome: Progressing   Problem: Education: Goal: Understanding of cardiac disease, CV risk reduction, and recovery process will improve Outcome: Progressing   Problem: Activity: Goal: Ability to tolerate increased activity will improve Outcome: Progressing   Problem: Cardiac: Goal: Ability to achieve and maintain adequate cardiovascular perfusion will improve Outcome: Progressing   Problem: Health Behavior/Discharge Planning: Goal: Ability to safely manage health-related needs after discharge will improve Outcome: Progressing   Problem: Education: Goal: Knowledge of General Education information will improve Description: Including pain rating scale, medication(s)/side effects and non-pharmacologic comfort measures Outcome: Progressing   Problem: Health Behavior/Discharge  Planning: Goal: Ability to manage health-related needs will improve Outcome: Progressing   Problem: Clinical Measurements: Goal: Ability to maintain clinical measurements within normal limits will improve Outcome: Progressing Goal: Will remain free from infection Outcome: Progressing Goal: Diagnostic test results will improve Outcome: Progressing Goal: Respiratory complications will improve Outcome: Progressing Goal: Cardiovascular complication will be avoided Outcome: Progressing   Problem: Activity: Goal: Risk for activity intolerance will decrease Outcome: Progressing   Problem: Nutrition: Goal: Adequate nutrition will be maintained Outcome: Progressing   Problem: Coping: Goal: Level of anxiety will decrease Outcome: Progressing   Problem: Elimination: Goal: Will not experience complications related to bowel motility Outcome: Progressing Goal: Will not experience complications related to urinary retention Outcome: Progressing   Problem: Pain Managment: Goal: General experience of comfort will improve and/or be controlled Outcome: Progressing   Problem: Safety: Goal: Ability to remain free from injury will improve Outcome: Progressing   Problem: Skin Integrity: Goal: Risk for impaired skin integrity will decrease Outcome: Progressing

## 2024-08-10 NOTE — Progress Notes (Addendum)
 PROGRESS NOTE    Shane Chambers  FMW:[id number] DOB: 09/14/1961 DOA: 08/09/2024 PCP: System, Provider Not In  Brief Narrative: This patient is admitted from the nursing home.  63 y.o. male with medical history significant for COPD, OSA, chronic hypoxic respiratory failure, CAD status post CABG in 2021, chronic HFpEF, rectal cancer s/p resection, CKD stage IV, hypertension, hyperlipidemia, type 2 diabetes mellitus, hypothyroidism, depression, and neurogenic bladder with suprapubic catheter who presents with right upper quadrant pain and nausea.  A gallbladder ultrasound shows gallstones with no evidence of cholecystitis. CT abdomen and pelvis shows moderate constipation with stool burden in sigmoid and rectum. On admission his BUN was 107 and creatinine was 2.51 sodium 134 potassium 3.8, troponin 274 and 265, hemoglobin 10.1 Started on a heparin  drip in the ED after discussing with cardiology.  Assessment & Plan:   Principal Problem:   Elevated troponin Active Problems:   COPD (chronic obstructive pulmonary disease) (HCC)   Hypothyroidism   Type II diabetes mellitus with renal manifestations (HCC)   Chronic diastolic CHF (congestive heart failure) (HCC)   CKD (chronic kidney disease), stage IV (HCC)   Normocytic anemia   Chronic respiratory failure with hypoxia (HCC)   Coronary artery disease   OSA (obstructive sleep apnea)   Rectal cancer (HCC)   Major depressive disorder with psychotic features (HCC)   RUQ abdominal pain   UTI (urinary tract infection)  1. Elevated troponin  - Hx of CAD s/p CABG in 2021    - Troponin was 274 then 265 without chest pain or acute ischemic changes on EKG   - ASA 324 was given and IV heparin  was started in ED; cardiology was consulted by ED physician Follow up echo - Continue IV heparin  for now, continue cardiac monitoring, continue ASA and statin   2. RUQ abdominal pain  - No acute hepatobiliary findings on CT; LFTs normal; lipase mildly elevated  in ED without pancreatic edema or inflammatory changes on CT  - Check RUQ US  gall stones No cholecystitis Check hida ADVANCE DIET   3. UTI ua not convincing Dc bactrim     4. COPD; OSA; chronic hypoxic respiratory failure  - Not in exacerbation  - Continue supplemental O2, CPAP while sleeping, ICS-LABA, and short-acting bronchodilators as-needed     5. CKD IV  - Appears close to baseline  - Renally-dose medications     6. Chronic HFpEF  - Appears compensated  - Hold diuretic while NPO, monitor volume status     7. Mood disorder  - Lamictal, trazodone     8. Type II DM  - Check CBGs and use low-intensity SSI    9. Hypothyroidism  - Synthroid     10. Rectal cancer  - S/p resection    11. Anemia  - Appears stable  - Monitor closely while on IV heparin     Estimated body mass index is 35 kg/m as calculated from the following:   Height as of this encounter: 5' 9 (1.753 m).   Weight as of this encounter: 107.5 kg.  DVT prophylaxis:heparin  Code Status: full Family Communication:none Disposition Plan:  Status is: Inpatient Remains inpatient appropriate because: acute illness   Consultants:  cards  Procedures:none Antimicrobials:none  Subjective: Very sleepy  No confusion Can't keep eyes open falls asleep while talking Hold narcotics neurontin    Objective: Vitals:   08/10/24 0201 08/10/24 0304 08/10/24 0832 08/10/24 0849  BP: (!) 164/70 (!) 155/70 (!) 97/53 106/76  Pulse: (!) 50 (!) 56 (!) 47 (!) 52  Resp: 15 17 12 16   Temp: 98 F (36.7 C) 97.8 F (36.6 C) 97.6 F (36.4 C) 97.8 F (36.6 C)  TempSrc:  Oral Oral Oral  SpO2: 99% 100% 100% 100%  Weight:  107.5 kg    Height:  5' 9 (1.753 m)      Intake/Output Summary (Last 24 hours) at 08/10/2024 1140 Last data filed at 08/10/2024 0904 Gross per 24 hour  Intake 758.73 ml  Output 500 ml  Net 258.73 ml   Filed Weights   08/09/24 1940 08/10/24 0304  Weight: 107 kg 107.5 kg     Examination:  General exam: Appears chronically ill appearing drowsy  Respiratory system: Clear to auscultation. Respiratory effort normal. Cardiovascular system: S1 & S2 heard, RRR. No JVD, murmurs, rubs, gallops or clicks. No pedal edema. Gastrointestinal system: soft generalized tender SPC Central nervous system: drowsy Extremities:edema trace   Data Reviewed: I have personally reviewed following labs and imaging studies  CBC: Recent Labs  Lab 08/09/24 2112  WBC 8.5  NEUTROABS 6.2  HGB 10.1*  HCT 29.7*  MCV 92.5  PLT 191   Basic Metabolic Panel: Recent Labs  Lab 08/09/24 2112  NA 134*  K 3.8  CL 92*  CO2 28  GLUCOSE 112*  BUN 107*  CREATININE 2.51*  CALCIUM  10.2  MG 2.0   GFR: Estimated Creatinine Clearance: 37.3 mL/min (A) (by C-G formula based on SCr of 2.51 mg/dL (H)). Liver Function Tests: Recent Labs  Lab 08/09/24 2112  AST 21  ALT 14  ALKPHOS 97  BILITOT 0.3  PROT 7.4  ALBUMIN  4.3   Recent Labs  Lab 08/09/24 2112  LIPASE 112*   No results for input(s): AMMONIA in the last 168 hours. Coagulation Profile: No results for input(s): INR, PROTIME in the last 168 hours. Cardiac Enzymes: No results for input(s): CKTOTAL, CKMB, CKMBINDEX, TROPONINI in the last 168 hours. BNP (last 3 results) No results for input(s): PROBNP in the last 8760 hours. HbA1C: No results for input(s): HGBA1C in the last 72 hours. CBG: Recent Labs  Lab 08/10/24 0047 08/10/24 0410 08/10/24 0810 08/10/24 1114  GLUCAP 109* 113* 103* 177*   Lipid Profile: No results for input(s): CHOL, HDL, LDLCALC, TRIG, CHOLHDL, LDLDIRECT in the last 72 hours. Thyroid Function Tests: No results for input(s): TSH, T4TOTAL, FREET4, T3FREE, THYROIDAB in the last 72 hours. Anemia Panel: No results for input(s): VITAMINB12, FOLATE, FERRITIN, TIBC, IRON , RETICCTPCT in the last 72 hours. Sepsis Labs: Recent Labs  Lab  08/09/24 2112  LATICACIDVEN 0.9    Recent Results (from the past 240 hours)  MRSA Next Gen by PCR, Nasal     Status: Abnormal   Collection Time: 08/10/24  5:56 AM   Specimen: Nasal Mucosa; Nasal Swab  Result Value Ref Range Status   MRSA by PCR Next Gen DETECTED (A) NOT DETECTED Final    Comment: RESULT CALLED TO, READ BACK BY AND VERIFIED WITH: RN CLAUDIA F ON [id number] @0819  BY SM (NOTE) The GeneXpert MRSA Assay (FDA approved for NASAL specimens only), is one component of a comprehensive MRSA colonization surveillance program. It is not intended to diagnose MRSA infection nor to guide or monitor treatment for MRSA infections. Test performance is not FDA approved in patients less than 83 years old. Performed at [facility], [address], [place], [place] [address]          Radiology Studies: US  Abdomen Limited RUQ (LIVER/GB) Result Date: 08/10/2024 CLINICAL DATA:  Right upper quadrant  pain. [id number]. EXAM: ULTRASOUND ABDOMEN LIMITED RIGHT UPPER QUADRANT COMPARISON:  CT without contrast yesterday. FINDINGS: Gallbladder: There are multiple layering gallstones, largest is 1.4 cm. No free wall thickening, sonographic Murphy's sign or pericholecystic fluid are seen. Common bile duct: Diameter: 3.1 mm.  No intrahepatic biliary dilatation. Liver: No focal lesion identified. Parenchymal echogenicity is increased consistent with the mild fatty replacement noted on the CT. Portal vein is patent on color Doppler imaging with normal direction of blood flow towards the liver. Other: None. IMPRESSION: 1. Cholelithiasis without sonographic evidence of acute cholecystitis. 2. Mild fatty replacement of the liver. Electronically Signed   By: Francis Quam M.D.   On: 08/10/2024 04:21   CT ABDOMEN PELVIS WO CONTRAST Result Date: 08/09/2024 CLINICAL DATA:  Upper abdominal pain EXAM: CT ABDOMEN AND PELVIS WITHOUT CONTRAST TECHNIQUE: Multidetector CT imaging of the abdomen and pelvis was performed  following the standard protocol without IV contrast. RADIATION DOSE REDUCTION: This exam was performed according to the departmental dose-optimization program which includes automated exposure control, adjustment of the mA and/or kV according to patient size and/or use of iterative reconstruction technique. COMPARISON:  CT 07/30/2023 FINDINGS: Lower chest: Minimal clustered nodularity at the lateral right upper lobe inferiorly. Bandlike atelectasis at the right middle lobe and left upper lobe posteriorly. No acute airspace disease. Multi-vessel coronary vascular calcification. Hepatobiliary: Gallstones. No focal hepatic abnormality or biliary dilatation Pancreas: Unremarkable. No pancreatic ductal dilatation or surrounding inflammatory changes. Spleen: Splenic granuloma Adrenals/Urinary Tract: Adrenal glands are normal. Nonspecific perinephric stranding. No hydronephrosis. Suprapubic catheter within the bladder which is decompressed. The bladder appears diffusely thick walled. Stomach/Bowel: Stomach within normal limits. No dilated small bowel. No acute bowel wall thickening. Moderate stool in the sigmoid colon and rectum. Vascular/Lymphatic: Aortic atherosclerosis. No enlarged abdominal or pelvic lymph nodes. Reproductive: Negative prostate Other: Negative for pelvic effusion or free air Musculoskeletal: No acute osseous abnormality. Multilevel degenerative change IMPRESSION: 1. No CT evidence for acute intra-abdominal or pelvic abnormality. 2. Gallstones. 3. Suprapubic catheter within the bladder which is decompressed. The bladder appears diffusely thick walled, correlate for cystitis 4. Moderate stool in the sigmoid colon and rectum. 5. Aortic atherosclerosis. Aortic Atherosclerosis (ICD10-I70.0). Electronically Signed   By: Luke Bun M.D.   On: 08/09/2024 22:11    Scheduled Meds:  aspirin  EC  81 mg Oral Daily   atorvastatin   40 mg Oral QHS   bisacodyl   10 mg Oral Daily   carvedilol   25 mg Oral BID WC    Chlorhexidine  Gluconate Cloth  6 each Topical Daily   fluticasone  furoate-vilanterol  1 puff Inhalation Daily   gabapentin   600 mg Oral BID   hydrALAZINE   100 mg Oral BID   insulin  aspart  0-6 Units Subcutaneous Q4H   lamoTRIgine  100 mg Oral BID   levothyroxine   100 mcg Oral Daily   lubiprostone   24 mcg Oral BID WC   mupirocin  ointment  1 Application Nasal BID   pantoprazole   40 mg Oral Daily   polyethylene glycol  17 g Oral BID   senna-docusate  3 tablet Oral BID   sulfamethoxazole -trimethoprim   1 tablet Oral Q12H   tamsulosin   0.8 mg Oral QPM   traZODone   200 mg Oral QHS   Continuous Infusions:  heparin  1,300 Units/hr (08/10/24 0904)     LOS: 1 day    Time spent: 45 min  Almarie KANDICE Hoots, MD  08/10/2024, 11:40 AM

## 2024-08-10 NOTE — ED Notes (Signed)
 Dr. Antionette Char at bedside

## 2024-08-10 NOTE — Consult Note (Signed)
 Cardiology Consultation   Patient ID: Shane Chambers MRN: [id number]; DOB: May 09, 1961  Admit date: 08/09/2024 Date of Consult: 08/10/2024  PCP:  System, Provider Not In   [facility] Providers Cardiologist:  Alvan Carrier, MD     Patient Profile: Shane Chambers is a 63 y.o. male with a hx of CAD s/p CABG x5 in 2021 (LIMA-LAD, RIMA -PL, Lradial- OM1-RI-D1), OSA, chronic hypoxic resp failure, HFpEF, rectal cancer s/p resection, CKD4, htn. Hld, DM, hypothyroidism, depression, and neurogenic bladder who is being seen 08/10/2024 for the evaluation of elevated troponin at the request of Dr. Melvenia.  History of Present Illness: Shane. Chambers presents today with RUQ pain and right sided chest pain.  The right upper abdominal pain started as a dull sensation about 2 months ago. The pain is worse with sitting upright. He has not been eating as he does not like the food at his facility. The pain can be worse after eating.   The right sided chest pain has been ongoing for a similar amount of time. Occasionally, the pain is worse with positional changes and taking a deep breath. The chest pain is not worse with exertion. However, he notes that he does not exert himself too much. However, he does not have worsening pain with walking. This pain is different than his pain with CABG.   The patient also reports shortness of breath for the past few months which is worse with exertion. He also reports some orthopnea but states the pain is mainly worse with lying flat.  In the ED, VS notable for HR in the 50s, BP 140-180/60-90s, satting 90% while breathing 3lpm O2. Troponin 274-> 265. Cr 2.5. ECG without STEMI. CT without contrast of abdomen/pelvis without any evidence of acute intra-abdominal or pelvic abnormality.  He quit smoking several years ago.   He was loaded with aspirin  and started on a heparin  gtt prior to transfer to [facility].   Past Medical History:  Diagnosis Date   Acute on chronic  combined systolic and diastolic CHF (congestive heart failure) (HCC) 03/05/2020   Acute on chronic heart failure with preserved ejection fraction (HFpEF) (HCC) 03/27/2023   Anemia    Arthritis    CAD (coronary artery disease)    a. s/p CABG in 03/2020 with LIMA-LAD, RIMA-PL, RA-D1-RI-OM1   Cancer (HCC)    rectal   Cellulitis    COPD (chronic obstructive pulmonary disease) (HCC)    Depression    Diabetes mellitus without complication (HCC)    Encounter for HIV pre-exposure prophylaxis 07/25/2024   GERD (gastroesophageal reflux disease)    History of kidney stones    Hyperlipidemia 12/01/2019   Hypertension    Hypothyroidism    Ischemic cardiomyopathy    a. EF 20-25% by echo in 02/2020 b. at 40% by echo in 03/2020 c. EF normalized to 60-65% by echo in 04/2020   Myocardial infarction [facility])    Peripheral vascular disease    Sleep apnea    Type 2 diabetes mellitus (HCC)    Past Surgical History:  Procedure Laterality Date   AMPUTATION Right 10/05/2022   Procedure: AMPUTATION BELOW KNEE;  Surgeon: Harden Jerona GAILS, MD;  Location: [facility] OR;  Service: Orthopedics;  Laterality: Right;   AMPUTATION Right 11/14/2022   Procedure: AMPUTATION BELOW KNEE REVISION; WASHOUT;  Surgeon: Jama Cordella MATSU, MD;  Location: [facility] ORS;  Service: Vascular;  Laterality: Right;   AMPUTATION Right 11/18/2022   Procedure: AMPUTATION BELOW KNEE REVISION AND CLOSURE;  Surgeon: Jama Cordella  G, MD;  Location: [facility] ORS;  Service: Vascular;  Laterality: Right;   APPLICATION OF WOUND VAC Right 10/05/2022   Procedure: APPLICATION OF WOUND VAC;  Surgeon: Harden Jerona GAILS, MD;  Location: [facility] OR;  Service: Orthopedics;  Laterality: Right;   BIOPSY  07/18/2021   Procedure: BIOPSY;  Surgeon: [name], DO;  [facility];  Service: Endoscopy;;   BIOPSY  01/09/2022   Procedure: BIOPSY;  Surgeon: [name], DO;  [facility];  Service: Endoscopy;;   BIOPSY  06/29/2023   Procedure: BIOPSY;   Surgeon: [name], DO;  [facility];  Service: Endoscopy;;   COLONOSCOPY WITH PROPOFOL  N/A 07/18/2021   Carver: 15 millimeter polyp removed from the sigmoid colon, 5 mm polyp removed from sigmoid colon.  Nonbleeding internal hemorrhoids.  Significant looping of the colon. sigmoid path showed invasive colonic adenocarcinoma involving tubular adenoma (invades to depth of 2mm, carcinoma 1mm from margin, no lymphovascular invasion, no poorly differentiated component.   COLONOSCOPY WITH PROPOFOL  N/A 06/29/2023   Procedure: COLONOSCOPY WITH PROPOFOL ;  Surgeon: [name], DO;  [facility];  Service: Endoscopy;  Laterality: N/A;  7:30 am, asa 3   CORONARY ARTERY BYPASS GRAFT N/A 03/13/2020   Procedure: CORONARY ARTERY BYPASS GRAFTING (CABG) times five using bilateral Internal mammary arteries and left radial artery;  Surgeon: German Bartlett PEDLAR, MD;  Location: [facility] OR;  Service: Open Heart Surgery;  Laterality: N/A;   DENTAL SURGERY     ESOPHAGEAL DILATION N/A 07/22/2024   Procedure: DILATION, ESOPHAGUS;  Surgeon: [name], DO;  [facility];  Service: Endoscopy;  Laterality: N/A;   ESOPHAGOGASTRODUODENOSCOPY N/A 07/22/2024   Procedure: EGD (ESOPHAGOGASTRODUODENOSCOPY);  Surgeon: [name], DO;  [facility];  Service: Endoscopy;  Laterality: N/A;  9:00 am, asa 3   ESOPHAGOGASTRODUODENOSCOPY (EGD) WITH PROPOFOL  N/A 07/18/2021   Carver: 1 gastric polyp status post biopsy, gastritis. gastric bx with slight chronic inflammation and no H.pyori. GEJ polypectomy with mild inflammation only   FLEXIBLE SIGMOIDOSCOPY N/A 08/26/2021   Carver: Nonbleeding internal hemorrhoids.  15 mm ulcers from previous polypectomy found in the rectum.  No evidence of previous polyp.  Located 5 to 8 cm from anal verge.   FLEXIBLE SIGMOIDOSCOPY N/A 01/09/2022   Procedure: FLEXIBLE SIGMOIDOSCOPY;  Surgeon: [name], DO;  [facility];  Service:  Endoscopy;  Laterality: N/A;   IR CYSTOSTOMY TUBE PLACEMENT/BLADDER ASPIRATION  03/11/2024   IRRIGATION AND DEBRIDEMENT ELBOW Left 02/20/2024   Procedure: LEFT ELBOW IRRIGATION AND DEBRIDEMENT;  Surgeon: Germaine Redbird, MD;  Location: [facility] OR;  Service: Orthopedics;  Laterality: Left;   POLYPECTOMY  07/18/2021   Procedure: POLYPECTOMY INTESTINAL;  Surgeon: [name], DO;  [facility];  Service: Endoscopy;;   POLYPECTOMY  06/29/2023   Procedure: POLYPECTOMY INTESTINAL;  Surgeon: [name], DO;  [facility];  Service: Endoscopy;;   RADIAL ARTERY HARVEST Left 03/13/2020   Procedure: RADIAL ARTERY HARVEST,;  Surgeon: German Bartlett PEDLAR, MD;  Location: [facility] OR;  Service: Open Heart Surgery;  Laterality: Left;   RIGHT/LEFT HEART CATH AND CORONARY ANGIOGRAPHY N/A 03/07/2020   Procedure: RIGHT/LEFT HEART CATH AND CORONARY ANGIOGRAPHY;  Surgeon: Wonda Sharper, MD;  Location: [facility] INVASIVE CV LAB;  Service: Cardiovascular;  Laterality: N/A;   STUMP REVISION Right 12/17/2022   Procedure: RIGHT BELOW KNEE AMPUTATION REVISION;  Surgeon: Harden Jerona GAILS, MD;  Location: [facility] OR;  Service:  Orthopedics;  Laterality: Right;   SUBMUCOSAL LIFTING INJECTION  01/09/2022   Procedure: SUBMUCOSAL LIFTING INJECTION;  Surgeon: [name], DO;  [facility];  Service: Endoscopy;;   SUBMUCOSAL TATTOO INJECTION  01/09/2022   Procedure: SUBMUCOSAL TATTOO INJECTION;  Surgeon: [name], DO;  [facility];  Service: Endoscopy;;   TEE WITHOUT CARDIOVERSION N/A 03/13/2020   Procedure: TRANSESOPHAGEAL ECHOCARDIOGRAM (TEE);  Surgeon: German Bartlett PEDLAR, MD;  Location: [facility] OR;  Service: Open Heart Surgery;  Laterality: N/A;    Home Medications:  Prior to Admission medications   Medication Sig Start Date End Date Taking? Authorizing Provider  aspirin  81 MG EC tablet Take 1 tablet (81 mg total) by mouth daily with breakfast. 11/29/20   Pearlean Manus, MD  atorvastatin   (LIPITOR ) 40 MG tablet Take 1 tablet (40 mg total) by mouth at bedtime. 11/28/22   Von Bellis, MD  cabotegravir  ER (APRETUDE ) 600 MG/3ML injection Inject 3 mLs (600 mg total) into the muscle every 30 (thirty) days. 07/27/24   Kuppelweiser, Cassie L, RPH-CPP  carvedilol  (COREG ) 25 MG tablet Take 1 tablet (25 mg total) by mouth 2 (two) times daily with a meal. 04/02/23   Drusilla Sabas RAMAN, MD  ciprofloxacin  (CILOXAN ) 0.3 % ophthalmic solution  07/18/24   [provider]  ferrous gluconate  (FERGON) 324 MG tablet Take 324 mg by mouth daily.    [provider]  fluticasone  (FLONASE ) 50 MCG/ACT nasal spray Place into both nostrils. 07/13/24   [provider]  fluticasone -salmeterol (ADVAIR) 500-50 MCG/ACT AEPB Inhale 1 puff into the lungs in the morning and at bedtime.    [provider]  gabapentin  (NEURONTIN ) 600 MG tablet Take 600 mg by mouth 2 (two) times daily. 05/24/24   [provider]  hydrALAZINE  (APRESOLINE ) 100 MG tablet Take 1 tablet (100 mg total) by mouth 2 (two) times daily. 10/31/23   Ricky Fines, MD  HYDROcodone -acetaminophen  (NORCO/VICODIN) 5-325 MG tablet Take by mouth. 04/25/24   [provider]  insulin  aspart (NOVOLOG  FLEXPEN) 100 UNIT/ML FlexPen 0-15 Units, Subcutaneous, 3 times daily with meals CBG < 70: implement hypoglycemia protocol-call MD CBG 70 - 120: 0 units CBG 121 - 150: 2 units CBG 151 - 200: 3 units CBG 201 - 250: 5 units CBG 251 - 300: 8 units CBG 301 - 350: 11 units CBG 351 - 400: 15 units CBG > 400: 10/17/22   Ghimire, Donalda HERO, MD  lactulose  (CHRONULAC ) 10 GM/15ML solution Take 15 mLs (10 g total) by mouth 2 (two) times daily as needed for mild constipation. 06/29/24   Shirlean Therisa ORN, NP  lamoTRIgine (LAMICTAL) 100 MG tablet Take 100 mg by mouth 2 (two) times daily. 06/15/24   [provider]  levothyroxine  (SYNTHROID ) 100 MCG tablet Take 100 mcg by mouth daily. 05/30/24   [provider]  lubiprostone   (AMITIZA ) 24 MCG capsule Take 1 capsule (24 mcg total) by mouth 2 (two) times daily with a meal. 06/29/24   Shirlean Therisa ORN, NP  lubiprostone  (AMITIZA ) 8 MCG capsule Take 1 capsule (8 mcg total) by mouth 2 (two) times daily with a meal. 09/29/23   Shirlean Therisa ORN, NP  Luliconazole 1 % CREA Apply topically. 04/21/24   [provider]  metolazone  (ZAROXOLYN ) 2.5 MG tablet Take 1 tablet (2.5 mg total) by mouth once a week. On Wednesdays 10/31/23   Ricky Fines, MD  pantoprazole  (PROTONIX ) 40 MG tablet Take 1 tablet (40 mg total) by mouth daily. Before  breakfast 06/29/24   Shirlean Therisa ORN, NP  polyvinyl alcohol  (LIQUIFILM TEARS) 1.4 % ophthalmic solution Place 1 drop into both eyes daily.    [provider]  potassium chloride  (KLOR-CON ) 10 MEQ tablet Take 10 mEq by mouth daily.    [provider]  sodium bicarbonate  325 MG tablet Take 325 mg by mouth 2 (two) times daily.    [provider]  tamsulosin  (FLOMAX ) 0.4 MG CAPS capsule Take 0.8 mg by mouth every evening.    [provider]  thiamine  (VITAMIN B-1) 100 MG tablet Take 1 tablet (100 mg total) by mouth daily. 10/18/22   Ghimire, Donalda HERO, MD  torsemide  (DEMADEX ) 20 MG tablet Take 2 tablets (40 mg total) by mouth 2 (two) times daily. 10/31/23   Ricky Fines, MD  traZODone  (DESYREL ) 100 MG tablet Take 200 mg by mouth at bedtime. 01/19/24   [provider]    Scheduled Meds:  aspirin  EC  81 mg Oral Daily   atorvastatin   40 mg Oral QHS   carvedilol   25 mg Oral BID WC   fluticasone  furoate-vilanterol  1 puff Inhalation Daily   gabapentin   600 mg Oral BID   hydrALAZINE   100 mg Oral BID   insulin  aspart  0-6 Units Subcutaneous Q4H   lamoTRIgine  100 mg Oral BID   levothyroxine   100 mcg Oral Daily   lubiprostone   24 mcg Oral BID WC   pantoprazole   40 mg Oral Daily   sulfamethoxazole -trimethoprim   1 tablet Oral Q12H   tamsulosin   0.8 mg Oral QPM   traZODone   200 mg Oral QHS   Continuous  Infusions:  heparin  1,300 Units/hr (08/09/24 2354)   PRN Meds: acetaminophen , HYDROmorphone  (DILAUDID ) injection, ipratropium-albuterol , oxyCODONE , prochlorperazine   Allergies:    Allergies  Allergen Reactions   Ceftriaxone  Anaphylaxis    (Rocephin ) Previously tolerated at [facility] during multiple admissions. Patient reports SOB reaction in [place], Summer 2024. Now reporting SOB reaction at [facility] 02/21/24. No swelling, or skin reaction. Satted well on baseline oxygen. Does not wish to receive Ceftriaxone  in the future.   Ace Inhibitors Swelling and Cough   Chlorhexidine     Haloperidol  And Related     Do NOT give anti-psychotics due to risk of  Torsades and QT prolongation (per Dr. Trude request)   Other Itching    [name]   Pork Allergy Other (See Comments)    Not listed in [facility]   Shellfish Allergy     Listed on MAR   Penicillins Itching and Rash    Tolerated ancef  (12-17-22)    Social History:   Social History   Socioeconomic History   Marital status: Single    Spouse name: Not on file   Number of children: Not on file   Years of education: Not on file   Highest education level: Not on file  Occupational History   Not on file  Tobacco Use   Smoking status: Former    Current packs/day: 0.00    Types: Cigarettes    Quit date: 05/25/1995    Years since quitting: 29.2   Smokeless tobacco: Never  Vaping Use   Vaping status: Never Used  Substance and Sexual Activity   Alcohol  use: Not Currently    Comment: rarely   Drug use: No   Sexual activity: Not Currently  Other Topics Concern   Not on file  Social History Narrative   Not on file   Social Drivers of Health   Financial  Resource Strain: Not on file  Food Insecurity: No Food Insecurity (02/21/2024)   Hunger Vital Sign    Worried About Running Out of Food in the Last Year: Never true    Ran Out of Food in the Last Year: Never true  Transportation Needs: No Transportation Needs (02/21/2024)    PRAPARE - Administrator, Civil Service (Medical): No    Lack of Transportation (Non-Medical): No  Physical Activity: Not on file  Stress: Not on file  Social Connections: Unknown (02/21/2024)   Social Connection and Isolation Panel    Frequency of Communication with Friends and Family: Not on file    Frequency of Social Gatherings with Friends and Family: Not on file    Attends Religious Services: Not on file    Active Member of Clubs or Organizations: Not on file    Attends Banker Meetings: Not on file    Marital Status: Never married  Intimate Partner Violence: Not At Risk (02/21/2024)   Humiliation, Afraid, Rape, and Kick questionnaire    Fear of Current or Ex-Partner: No    Emotionally Abused: No    Physically Abused: No    Sexually Abused: No    Family History:    Family History  Problem Relation Age of Onset   Hypertension Mother      ROS:  Please see the history of present illness.  All other ROS reviewed and negative.     Physical Exam/Data: Vitals:   08/10/24 0115 08/10/24 0130 08/10/24 0145 08/10/24 0201  BP: (!) 167/62 (!) 167/62 (!) 161/66 (!) 164/70  Pulse: (!) 51 (!) 48 (!) 49 (!) 50  Resp: 18 13 15 15   Temp:    98 F (36.7 C)  TempSrc:      SpO2: 99% 100% 100% 99%  Weight:      Height:        Intake/Output Summary (Last 24 hours) at 08/10/2024 0257 Last data filed at 08/09/2024 2232 Gross per 24 hour  Intake 500 ml  Output --  Net 500 ml      08/09/2024    7:40 PM 07/22/2024    8:03 AM 06/29/2024   11:43 AM  Last 3 Weights  Weight (lbs) 235 lb 14.3 oz 236 lb 226 lb  Weight (kg) 107 kg 107.049 kg 102.513 kg     Body mass index is 34.84 kg/m.  General:  Well nourished, well developed, in no acute distress HEENT: normal Neck: no JVD Vascular: No carotid bruits; Distal pulses 2+ bilaterally Cardiac:  normal S1, S2; RRR; systolic murmur Lungs:  clear to auscultation bilaterally, no wheezing, rhonchi or rales  Abd: soft,  intense RUQ tenderness Ext: no edema Musculoskeletal:  No deformities, BUE and BLE strength normal and equal Skin: warm and dry  Neuro:  no focal abnormalities noted Psych:  Normal affect   EKG:  The EKG was personally reviewed and demonstrates:  SR, q waves II, III, avF, prolonged QT Telemetry:  Telemetry was personally reviewed and demonstrates:  sinus rhythm   Relevant CV Studies: TTE (10/24/2023) IMPRESSIONS   1. Left ventricular ejection fraction, by estimation, is 55 to 60%. The  left ventricle has normal function. The left ventricle has no regional  wall motion abnormalities. The left ventricular internal cavity size was  mildly to moderately dilated. There is mild left ventricular hypertrophy. Left ventricular diastolic parameters are consistent with Grade III diastolic dysfunction  (restrictive). Elevated left atrial pressure. The E/e' is 73.  2. Right ventricular systolic function is mildly reduced. The right  ventricular size is normal.   3. Left atrial size was severely dilated.   4. The mitral valve is normal in structure. Mild mitral valve regurgitation. No evidence of mitral stenosis.   5. The aortic valve has an indeterminant number of cusps. Aortic valve regurgitation is not visualized. Mild aortic valve stenosis.   6. The inferior vena cava is dilated in size with >50% respiratory variability, suggesting right atrial pressure of 8 mmHg.  Comparison(s): A prior study was performed on 02/16/2023. Grade 3  diastolic dysfunction and elevated filling pressures are new findings.  Prior study noted moderate AS which is at best mild on current study.  Otherwise see prior study for details.   Coronary Angiogram (03/07/2020) Prox RCA lesion is 99% stenosed. Prox RCA to Mid RCA lesion is 40% stenosed. Dist RCA lesion is 30% stenosed. Prox LAD to Mid LAD lesion is 80% stenosed. 1st Diag lesion is 60% stenosed. Mid LM to Dist LM lesion is 40% stenosed. Prox Cx to Mid Cx  lesion is 95% stenosed.   1. 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 Moderately elevated LV/right heart filling pressures c/w known diagnosis of heart failure 3. Known severe LV dysfunction by echo   Recommend: review revascularization options. Pt has surgical anatomy, but not sure he will be a candidate for CABG. High-risk multivessel PCI could be considered if he is not a candidate for surgery.  Laboratory Data: High Sensitivity Troponin: 274-> 265  Chemistry Recent Labs  Lab 08/09/24 2112  NA 134*  K 3.8  CL 92*  CO2 28  GLUCOSE 112*  BUN 107*  CREATININE 2.51*  CALCIUM  10.2  MG 2.0  GFRNONAA 28*  ANIONGAP 15    Recent Labs  Lab 08/09/24 2112  PROT 7.4  ALBUMIN  4.3  AST 21  ALT 14  ALKPHOS 97  BILITOT 0.3   Hematology Recent Labs  Lab 08/09/24 2112  WBC 8.5  RBC 3.21*  HGB 10.1*  HCT 29.7*  MCV 92.5  MCH 31.5  MCHC 34.0  RDW 12.5  PLT 191   Radiology/Studies:  CT ABDOMEN PELVIS WO CONTRAST Result Date: 08/09/2024 CLINICAL DATA:  Upper abdominal pain EXAM: CT ABDOMEN AND PELVIS WITHOUT CONTRAST TECHNIQUE: Multidetector CT imaging of the abdomen and pelvis was performed following the standard protocol without IV contrast. RADIATION DOSE REDUCTION: This exam was performed according to the departmental dose-optimization program which includes automated exposure control, adjustment of the mA and/or kV according to patient size and/or use of iterative reconstruction technique. COMPARISON:  CT 07/30/2023 FINDINGS: Lower chest: Minimal clustered nodularity at the lateral right upper lobe inferiorly. Bandlike atelectasis at the right middle lobe and left upper lobe posteriorly. No acute airspace disease. Multi-vessel coronary vascular calcification. Hepatobiliary: Gallstones. No focal hepatic abnormality or biliary dilatation Pancreas:  Unremarkable. No pancreatic ductal dilatation or surrounding inflammatory changes. Spleen: Splenic granuloma Adrenals/Urinary Tract: Adrenal glands are normal. Nonspecific perinephric stranding. No hydronephrosis. Suprapubic catheter within the bladder which is decompressed. The bladder appears diffusely thick walled. Stomach/Bowel: Stomach within normal limits. No dilated small bowel. No acute bowel wall thickening. Moderate stool in the sigmoid colon and rectum. Vascular/Lymphatic: Aortic atherosclerosis. No enlarged abdominal or pelvic lymph nodes. Reproductive: Negative prostate Other: Negative for pelvic effusion or free air Musculoskeletal: No acute osseous abnormality. Multilevel degenerative change IMPRESSION: 1. No CT  evidence for acute intra-abdominal or pelvic abnormality. 2. Gallstones. 3. Suprapubic catheter within the bladder which is decompressed. The bladder appears diffusely thick walled, correlate for cystitis 4. Moderate stool in the sigmoid colon and rectum. 5. Aortic atherosclerosis. Aortic Atherosclerosis (ICD10-I70.0). Electronically Signed   By: Luke Bun M.D.   On: 08/09/2024 22:11   Assessment and Plan: Elevated troponin CAD s/p CABG x5 in 2021 The patient presented with RUQ pain and has moderate tenderness on examination. His chest pain is not classic for cardiac chest pain as it is worse with positional changes, and it is not worse with walking. His troponin is mildly elevated but flat. He has been started on a heparin  gtt, however, his presentation is more consistent with a Type 2 NSTEMI rather than a type 1 event. He has known reasons for an elevated troponin including severe CAD, hypertensive crisis, and HFpEF. Given that he does have CKD, we'd like to be careful with an nephrotoxic agents. I do think he needs some sort of coronary evaluation, and we can continue the heparin  gtt for now as he is tolerating it well. However, we will need to see what is driving this abdominal  pain as this seems to be his most pressing issue. - Aspirin   - Heparin  - BP control  - Will decide on coronary angiogram in the AM once ultrasound results back/further evaluation of RUQ pain - TTE in AM  Abdominal pain Unclear etiology. RUQ ultrasound is pending. May benefit from discussion with general surgery given his pain and gallstones on CT.   HFpEF Compensated on examination. - Continue torsemide   Chronic kidney disease Cr 2.5 on presentation which is never baseline. Will be cautious with contrast amount given longstanding CKD and eGFR of 28.   Hyperlipidemia Last lipid profile from May 2024: tchol 104, HDL 28, LDL 61, triglycerides 74 - Repeat lipid panel - Continue atorvastatin  40 mg dialy  Hypertension  BP elevated here.  - Continue home BP meds  Diabetes Hgb A1c from 10/2022 6.1% (peak 11.1% in 2021) - Repeat hgb a1c   Risk Assessment/Risk Scores:  For questions or updates, please contact [facility] Please consult [email or web address] for contact info under    Signed, Jerrell DELENA Orchard, MD  08/10/2024 2:57 AM

## 2024-08-10 NOTE — Plan of Care (Signed)
   Problem: Coping: Goal: Ability to adjust to condition or change in health will improve Outcome: Progressing   Problem: Fluid Volume: Goal: Ability to maintain a balanced intake and output will improve Outcome: Progressing

## 2024-08-10 NOTE — TOC Initial Note (Signed)
 Transition of Care [facility]) - Initial/Assessment Note    Patient Details  Name: Shane Chambers MRN: [id number] Date of Birth: 1961-08-21  Transition of Care [facility]) CM/SW Contact:    [name], LCSWA Phone Number: 08/10/2024, 10:26 AM  Clinical Narrative:   Patient is from [facility] for long term care. CSW spoke with patient, who is alert and oriented x4, about assisting with patient discharging back to [place]. Patient is agreeable. CSW contacted [facility], they are agreeable to taking patient back when he is medically ready.   CSW will continue to follow.          Expected Discharge Plan: Skilled Nursing Facility Barriers to Discharge: Continued Medical Work up   Patient Goals and CMS Choice Patient states their goals for this hospitalization and ongoing recovery are:: To return to [facility] snf          Expected Discharge Plan and Services In-house Referral: Clinical Social Work     Living arrangements for the past 2 months: Skilled Nursing Facility                                      Prior Living Arrangements/Services Living arrangements for the past 2 months: Skilled Nursing Facility Lives with:: Facility Resident Patient language and need for interpreter reviewed:: Yes Do you feel safe going back to the place where you live?: Yes      Need for Family Participation in Patient Care: No (Comment) Care giver support system in place?: Yes (comment)   Criminal Activity/Legal Involvement Pertinent to Current Situation/Hospitalization: No - Comment as needed  Activities of Daily Living   ADL Screening (condition at time of admission) Independently performs ADLs?: No Does the patient have a NEW difficulty with bathing/dressing/toileting/self-feeding that is expected to last >3 days?: No Does the patient have a NEW difficulty with getting in/out of bed, walking, or climbing stairs that is expected to last >3 days?: No Does the patient have a NEW  difficulty with communication that is expected to last >3 days?: No Is the patient deaf or have difficulty hearing?: No Does the patient have difficulty seeing, even when wearing glasses/contacts?: No Does the patient have difficulty concentrating, remembering, or making decisions?: No  Permission Sought/Granted Permission sought to share information with : Facility Industrial/product designer granted to share information with : Yes, Verbal Permission Granted     Permission granted to share info w AGENCY: SNF        Emotional Assessment Appearance:: Appears stated age Attitude/Demeanor/Rapport: Engaged Affect (typically observed): Stable Orientation: : Oriented to Situation, Oriented to  Time, Oriented to Place, Oriented to Self Alcohol  / Substance Use: Not Applicable Psych Involvement: No (comment)  Admission diagnosis:  NSTEMI (non-ST elevated myocardial infarction) [facility]) [I21.4] Patient Active Problem List   Diagnosis Date Noted   RUQ abdominal pain 08/10/2024   UTI (urinary tract infection) 08/10/2024   NSTEMI (non-ST elevated myocardial infarction) (HCC) 08/09/2024   Encounter for HIV pre-exposure prophylaxis 07/25/2024   Dysphagia 06/29/2024   Gastroesophageal reflux disease 06/29/2024   Allergy to cephalosporin 02/22/2024   Septic joint (HCC) 02/20/2024   Chronic retention of urine 02/09/2024   Neurogenic bladder 02/09/2024   Suprapubic catheter (HCC) 02/09/2024   Ambulatory dysfunction 02/09/2024   Medically complex patient 02/09/2024   Behavior concern in adult 12/16/2023   Anemia of chronic renal failure 12/08/2023   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  Type II diabetes mellitus with renal manifestations (HCC) 07/30/2023   Chronic diastolic CHF (congestive heart failure) (HCC) 07/30/2023   Dyslipidemia 03/27/2023   Hypothyroidism 03/27/2023   CKD (chronic kidney disease), stage IV (HCC) 03/27/2023   Moderate aortic stenosis 03/27/2023   Hyperkalemia 02/15/2023    Pulmonary edema 02/15/2023   Bilateral pleural effusion 02/15/2023   Nausea & vomiting 02/15/2023   Chronic diarrhea 02/15/2023   High anion gap metabolic acidosis 02/14/2023   S/P BKA (below knee amputation) unilateral, right (HCC) 12/17/2022   MRSA infection 11/19/2022   Amputation stump infection (HCC) 11/19/2022   QT prolongation 11/10/2022   Severe recurrent major depression without psychotic features (HCC) 11/05/2022   Major depressive disorder with psychotic features (HCC) 11/02/2022   Acute osteomyelitis (HCC) 10/02/2022   Subacute osteomyelitis, right ankle and foot (HCC) 10/02/2022   Hypoalbuminemia due to protein-calorie malnutrition 10/02/2022   Cellulitis of right lower extremity 09/25/2022   Morbid obesity (HCC) 04/23/2022   Elevated troponin 04/22/2022   Cancer of sigmoid colon (HCC) 12/16/2021   Constipation 12/16/2021   GERD without esophagitis 12/16/2021   Iron  deficiency anemia 09/17/2021   Rectal cancer (HCC) 09/03/2021   Bilateral lower leg cellulitis 11/22/2020   Systolic and diastolic CHF, chronic (HCC) 11/22/2020   OSA (obstructive sleep apnea) 05/24/2020   COPD (chronic obstructive pulmonary disease) (HCC) 04/29/2020   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 [place] [address] Phone: [phone] Fax: [phone]  [facility] [place], [place] - [address] [place] [address] Phone: [phone] Fax: [phone]  [facility] - [facility] [address] [place] [address] Phone: [phone] Fax: [phone]     Social Drivers of Health (SDOH) Social History: SDOH Screenings   Food Insecurity: No Food Insecurity (08/10/2024)  Housing: Low Risk  (08/10/2024)  Transportation Needs: No Transportation Needs (02/21/2024)  Utilities: Not At Risk (02/21/2024)  Alcohol  Screen: Low Risk  (11/05/2022)  Depression (PHQ2-9): Low Risk  (06/09/2024)  Social Connections: Unknown (02/21/2024)  Tobacco Use: Medium Risk (08/09/2024)   SDOH Interventions:     Readmission Risk Interventions    10/25/2023   10:46 AM 02/19/2023    1:52 PM 02/15/2023   11:15 AM  Readmission Risk Prevention Plan  Transportation Screening Complete Complete Complete  Medication Review Oceanographer) Complete Complete Complete  PCP or Specialist appointment within 3-5 days of discharge  Complete   HRI or Home Care Consult Complete Complete Complete  SW Recovery Care/Counseling Consult Complete Complete Complete  Palliative Care Screening Not Applicable Not Applicable Not Applicable  Skilled Nursing Facility Complete Complete Complete

## 2024-08-10 NOTE — Progress Notes (Signed)
 Echocardiogram 2D Echocardiogram has been performed.  Shane Chambers 08/10/2024, 12:09 PM

## 2024-08-11 ENCOUNTER — Inpatient Hospital Stay (HOSPITAL_COMMUNITY)

## 2024-08-11 ENCOUNTER — Ambulatory Visit: Payer: Self-pay | Admitting: Internal Medicine

## 2024-08-11 DIAGNOSIS — R7989 Other specified abnormal findings of blood chemistry: Secondary | ICD-10-CM | POA: Diagnosis not present

## 2024-08-11 LAB — COMPREHENSIVE METABOLIC PANEL WITH GFR
ALT: 13 U/L (ref 0–44)
AST: 19 U/L (ref 15–41)
Albumin: 3.4 g/dL — ABNORMAL LOW (ref 3.5–5.0)
Alkaline Phosphatase: 76 U/L (ref 38–126)
Anion gap: 12 (ref 5–15)
BUN: 104 mg/dL — ABNORMAL HIGH (ref 8–23)
CO2: 28 mmol/L (ref 22–32)
Calcium: 8.8 mg/dL — ABNORMAL LOW (ref 8.9–10.3)
Chloride: 92 mmol/L — ABNORMAL LOW (ref 98–111)
Creatinine, Ser: 2.78 mg/dL — ABNORMAL HIGH (ref 0.61–1.24)
GFR, Estimated: 25 mL/min — ABNORMAL LOW (ref >60)
Glucose, Bld: 110 mg/dL — ABNORMAL HIGH (ref 70–99)
Potassium: 3.9 mmol/L (ref 3.5–5.1)
Sodium: 132 mmol/L — ABNORMAL LOW (ref 135–145)
Total Bilirubin: 0.7 mg/dL (ref 0.0–1.2)
Total Protein: 6.3 g/dL — ABNORMAL LOW (ref 6.5–8.1)

## 2024-08-11 LAB — CBC
HCT: 27.1 % — ABNORMAL LOW (ref 39.0–52.0)
Hemoglobin: 8.8 g/dL — ABNORMAL LOW (ref 13.0–17.0)
MCH: 30.3 pg (ref 26.0–34.0)
MCHC: 32.5 g/dL (ref 30.0–36.0)
MCV: 93.4 fL (ref 80.0–100.0)
Platelets: 172 K/uL (ref 150–400)
RBC: 2.9 MIL/uL — ABNORMAL LOW (ref 4.22–5.81)
RDW: 12.6 % (ref 11.5–15.5)
WBC: 7.6 K/uL (ref 4.0–10.5)
nRBC: 0 % (ref 0.0–0.2)

## 2024-08-11 LAB — GLUCOSE, CAPILLARY
Glucose-Capillary: 100 mg/dL — ABNORMAL HIGH (ref 70–99)
Glucose-Capillary: 110 mg/dL — ABNORMAL HIGH (ref 70–99)
Glucose-Capillary: 133 mg/dL — ABNORMAL HIGH (ref 70–99)
Glucose-Capillary: 148 mg/dL — ABNORMAL HIGH (ref 70–99)
Glucose-Capillary: 187 mg/dL — ABNORMAL HIGH (ref 70–99)
Glucose-Capillary: 89 mg/dL (ref 70–99)
Glucose-Capillary: 97 mg/dL (ref 70–99)

## 2024-08-11 LAB — HEMOGLOBIN A1C
Hgb A1c MFr Bld: 5.5 % (ref 4.8–5.6)
Mean Plasma Glucose: 111.15 mg/dL

## 2024-08-11 LAB — HEPARIN LEVEL (UNFRACTIONATED): Heparin Unfractionated: 0.38 [IU]/mL (ref 0.30–0.70)

## 2024-08-11 LAB — LIPID PANEL
Cholesterol: 87 mg/dL (ref 0–200)
HDL: 27 mg/dL — ABNORMAL LOW (ref >40)
LDL Cholesterol: 45 mg/dL (ref 0–99)
Total CHOL/HDL Ratio: 3.2 ratio
Triglycerides: 74 mg/dL (ref <150)
VLDL: 15 mg/dL (ref 0–40)

## 2024-08-11 LAB — OCCULT BLOOD X 1 CARD TO LAB, STOOL: Fecal Occult Bld: NEGATIVE

## 2024-08-11 MED ORDER — HEPARIN SODIUM (PORCINE) 5000 UNIT/ML IJ SOLN
5000.0000 [IU] | Freq: Three times a day (TID) | INTRAMUSCULAR | Status: DC
  Administered 2024-08-11 – 2024-08-12 (×4): 5000 [IU] via SUBCUTANEOUS
  Filled 2024-08-11 (×5): qty 1

## 2024-08-11 MED ORDER — TECHNETIUM TC 99M MEBROFENIN IV KIT
4.9900 | PACK | Freq: Once | INTRAVENOUS | Status: AC | PRN
  Administered 2024-08-11: 4.99 via INTRAVENOUS

## 2024-08-11 MED ORDER — HYDROCODONE-ACETAMINOPHEN 5-325 MG PO TABS
1.0000 | ORAL_TABLET | ORAL | Status: DC | PRN
  Administered 2024-08-11 – 2024-08-12 (×3): 1 via ORAL
  Filled 2024-08-11 (×3): qty 1

## 2024-08-11 MED ORDER — LACTULOSE 10 GM/15ML PO SOLN
10.0000 g | Freq: Three times a day (TID) | ORAL | Status: AC
  Administered 2024-08-11 – 2024-08-12 (×2): 10 g via ORAL
  Filled 2024-08-11 (×2): qty 15

## 2024-08-11 MED ORDER — GABAPENTIN 400 MG PO CAPS
400.0000 mg | ORAL_CAPSULE | Freq: Two times a day (BID) | ORAL | Status: DC
  Administered 2024-08-11 – 2024-08-12 (×2): 400 mg via ORAL
  Filled 2024-08-11 (×2): qty 1

## 2024-08-11 NOTE — Evaluation (Signed)
 Physical Therapy Evaluation Patient Details Name: Shane Chambers MRN: [id number] DOB: 21-May-1961 Today's Date: 08/11/2024  History of Present Illness  Pt is a 63 y.o. presenting to [facility] on 9/30 with RUQ pain and nausea. US  of gall bladder with findings of gall stones no evidence of cholecystitis. PMH: coronary artery disease status post CABG, COPD, depression, type II diabetes mellitus, GERD, hypertension, dyslipidemia and hypothyroidism as well as ischemic cardiomyopathy and peripheral vascular disease s/p right BKA.  Clinical Impression  Pt is presenting at supervision for bed mobility, Min A for sit to stand and transfers. Pt demonstrates good strength to progress ambulation with prosthetic for short distance and increase transfers to Mod I. Pt currently lives at [facility]. Due to pt current functional status, home set up and available assistance at home recommending skilled physical therapy services < 3 hours/day in order to address strength, balance and functional mobility to decrease risk for falls, injury, immobility, skin break down and re-hospitalization.          If plan is discharge home, recommend the following: A lot of help with walking and/or transfers;Assist for transportation;Assistance with cooking/housework;Help with stairs or ramp for entrance   Can travel by private vehicle   No    Equipment Recommendations None recommended by PT     Functional Status Assessment Patient has had a recent decline in their functional status and demonstrates the ability to make significant improvements in function in a reasonable and predictable amount of time.     Precautions / Restrictions Precautions Precautions: Fall Recall of Precautions/Restrictions: Intact Restrictions Weight Bearing Restrictions Per Provider Order: No      Mobility  Bed Mobility Overal bed mobility: Needs Assistance Bed Mobility: Supine to Sit     Supine to sit: Supervision     General bed  mobility comments: supervision assist with lines/leads. IV kept getting taught.    Transfers Overall transfer level: Needs assistance Equipment used: Rolling walker (2 wheels) Transfers: Sit to/from Stand, Bed to chair/wheelchair/BSC Sit to Stand: Min assist   Step pivot transfers: Min assist       General transfer comment: Min A for stability and Min A getting prosthetic on the RLE in order to perform transfer. Pt performed 2x sit to stand during session; uses momentum to get to standing from sitting.    Ambulation/Gait   Pre-gait activities: Pt took steps from EOB to recliner with Min A for stability and Moderate UE support on RW       Balance Overall balance assessment: Needs assistance Sitting-balance support: Feet supported, Single extremity supported Sitting balance-Leahy Scale: Good     Standing balance support: Bilateral upper extremity supported, During functional activity, Reliant on assistive device for balance Standing balance-Leahy Scale: Poor Standing balance comment: External support for stability         Pertinent Vitals/Pain Pain Assessment Pain Assessment: 0-10 Pain Score: 6  Pain Location: RUQ Pain Descriptors / Indicators: Aching Pain Intervention(s): Monitored during session    Home Living Family/patient expects to be discharged to:: Skilled nursing facility     Additional Comments: [facility]    Prior Function Prior Level of Function : Needs assist       Physical Assist : Mobility (physical) Mobility (physical): Transfers;Gait   Mobility Comments: Pt states he needs assistance when ambulating and transfering for safety ADLs Comments: Needs set up assist for ADLs and assist with toileting/bathing.     Extremity/Trunk Assessment   Upper Extremity Assessment Upper Extremity  Assessment: Defer to OT evaluation    Lower Extremity Assessment Lower Extremity Assessment: RLE deficits/detail RLE Deficits / Details: BKA     Cervical / Trunk Assessment Cervical / Trunk Assessment: Kyphotic  Communication   Communication Communication: No apparent difficulties    Cognition Arousal: Alert Behavior During Therapy: WFL for tasks assessed/performed   PT - Cognitive impairments: No apparent impairments     Following commands: Intact       Cueing Cueing Techniques: Verbal cues     General Comments General comments (skin integrity, edema, etc.): HR/O2 sats remained WNL On 3L O2 via [place]        Assessment/Plan    PT Assessment Patient needs continued PT services  PT Problem List Decreased strength;Decreased activity tolerance;Decreased balance;Decreased mobility       PT Treatment Interventions DME instruction;Balance training;Gait training;Functional mobility training;Therapeutic activities;Therapeutic exercise;Patient/family education    PT Goals (Current goals can be found in the Care Plan section)  Acute Rehab PT Goals Patient Stated Goal: to improve mobility to get out of rehab facility PT Goal Formulation: With patient Time For Goal Achievement: 08/25/24 Potential to Achieve Goals: Fair    Frequency Min 1X/week        AM-PAC PT 6 Clicks Mobility  Outcome Measure Help needed turning from your back to your side while in a flat bed without using bedrails?: A Little Help needed moving from lying on your back to sitting on the side of a flat bed without using bedrails?: A Little Help needed moving to and from a bed to a chair (including a wheelchair)?: A Little Help needed standing up from a chair using your arms (e.g., wheelchair or bedside chair)?: A Little Help needed to walk in hospital room?: Total Help needed climbing 3-5 steps with a railing? : Total 6 Click Score: 14    End of Session Equipment Utilized During Treatment: Gait belt;Other (comment) (prosthetic) Activity Tolerance: Patient tolerated treatment well Patient left: in chair;with call bell/phone within reach Nurse  Communication: Mobility status PT Visit Diagnosis: Unsteadiness on feet (R26.81);Other abnormalities of gait and mobility (R26.89);Muscle weakness (generalized) (M62.81)    Time: 8955-8884 PT Time Calculation (min) (ACUTE ONLY): 31 min   Charges:   PT Evaluation $PT Eval Low Complexity: 1 Low PT Treatments $Therapeutic Activity: 8-22 mins PT General Charges $$ ACUTE PT VISIT: 1 Visit        Dorothyann Maier, DPT, CLT  Acute Rehabilitation Services Office: [phone] (Secure chat preferred)   Dorothyann VEAR Maier 08/11/2024, 11:56 AM

## 2024-08-11 NOTE — Progress Notes (Signed)
 OT Cancellation Note  Patient Details Name: Shane Chambers MRN: [id number] DOB: 17-Feb-1961   Cancelled Treatment:    Reason Eval/Treat Not Completed: Patient at procedure or test/ unavailable (Out of the room on arrival, OT evaluation to f/u as able.)  Lucie JONETTA Kendall 08/11/2024, 2:28 PM

## 2024-08-11 NOTE — Progress Notes (Signed)
 Pt returned from MRI, is soaked under bed, all under his body, not urine. From perfuse sweating during MRI, arousable by name, but somnolent.

## 2024-08-11 NOTE — Progress Notes (Signed)
 PROGRESS NOTE    Shane Chambers  FMW:[id number] DOB: May 18, 1961 DOA: 08/09/2024 PCP: System, Provider Not In  Brief Narrative: This patient is admitted from the nursing home.  63 y.o. male with medical history significant for COPD, OSA, chronic hypoxic respiratory failure, CAD status post CABG in 2021, chronic HFpEF, rectal cancer s/p resection, CKD stage IV, hypertension, hyperlipidemia, type 2 diabetes mellitus, hypothyroidism, depression, and neurogenic bladder with suprapubic catheter who presents with right upper quadrant pain and nausea.  A gallbladder ultrasound shows gallstones with no evidence of cholecystitis. CT abdomen and pelvis shows moderate constipation with stool burden in sigmoid and rectum. On admission his BUN was 107 and creatinine was 2.51 sodium 134 potassium 3.8, troponin 274 and 265, hemoglobin 10.1 Started on a heparin  drip in the ED after discussing with cardiology.  Assessment & Plan:   Principal Problem:   Elevated troponin Active Problems:   COPD (chronic obstructive pulmonary disease) (HCC)   Hypothyroidism   Type II diabetes mellitus with renal manifestations (HCC)   Chronic diastolic CHF (congestive heart failure) (HCC)   CKD (chronic kidney disease), stage IV (HCC)   Normocytic anemia   Chronic respiratory failure with hypoxia (HCC)   Coronary artery disease   OSA (obstructive sleep apnea)   Rectal cancer (HCC)   Major depressive disorder with psychotic features (HCC)   RUQ abdominal pain   UTI (urinary tract infection)  1. Elevated troponin  - Hx of CAD s/p CABG in 2021    - Troponin was 274 then 265 without chest pain or acute ischemic changes on EKG   Patient was seen by cardiology.  Initially was on heparin  and this was stopped.  Echo shows normal ejection fraction and no wall motion abnormalities.  Continue aspirin  and statin.   2. RUQ abdominal pain  - No acute hepatobiliary findings on CT; LFTs normal; lipase mildly elevated in ED without  pancreatic edema or inflammatory changes on CT  - Check RUQ US  gall stones No cholecystitis Check hida pending. ADVANCE DIET as tolerated.   3. UTI ua not convincing Dc bactrim     4. COPD; OSA; chronic hypoxic respiratory failure  - Not in exacerbation  - Continue supplemental O2, CPAP while sleeping, ICS-LABA, and short-acting bronchodilators as-needed     5. CKD IV  - Appears close to baseline  - Renally-dose medications     6. Chronic HFpEF  - Appears compensated  - Hold diuretic while NPO, monitor volume status     7. Mood disorder  - Lamictal, trazodone     8. Type II DM  - Check CBGs and use low-intensity SSI    9. Hypothyroidism  - Synthroid     10. Rectal cancer  - S/p resection    11. Anemia  - Appears stable  - Monitor closely while on IV heparin     Estimated body mass index is 35 kg/m as calculated from the following:   Height as of this encounter: 5' 9 (1.753 m).   Weight as of this encounter: 107.5 kg.  DVT prophylaxis:heparin  Code Status: full Family Communication:none Disposition Plan:  Status is: Inpatient Remains inpatient appropriate because: acute illness   Consultants:  cards  Procedures:none Antimicrobials:none  Subjective: Awake than yesterday.  No nausea vomiting. Still has abdominal pain   Objective: Vitals:   08/11/24 0750 08/11/24 1130 08/11/24 1135 08/11/24 1200  BP: (!) 122/49 119/60  (!) 125/52  Pulse: (!) 52 (!) 54  (!) 56  Resp: 17 19 18 20   Temp:  97.8 F (36.6 C)  98 F (36.7 C) 98.6 F (37 C)  TempSrc: Oral   Oral  SpO2: 98% 100%  100%  Weight:      Height:        Intake/Output Summary (Last 24 hours) at 08/11/2024 1555 Last data filed at 08/11/2024 1200 Gross per 24 hour  Intake 432.67 ml  Output 1825 ml  Net -1392.33 ml   Filed Weights   08/09/24 1940 08/10/24 0304  Weight: 107 kg 107.5 kg    Examination:  General exam: Appears chronically ill appearing drowsy  Respiratory system: Clear to  auscultation. Respiratory effort normal. Cardiovascular system: S1 & S2 heard, RRR. No JVD, murmurs, rubs, gallops or clicks. No pedal edema. Gastrointestinal system: soft generalized tender SPC Central nervous system: drowsy Extremities:edema trace   Data Reviewed: I have personally reviewed following labs and imaging studies  CBC: Recent Labs  Lab 08/09/24 2112 08/10/24 1231 08/11/24 0248  WBC 8.5 10.2 7.6  NEUTROABS 6.2  --   --   HGB 10.1* 9.5* 8.8*  HCT 29.7* 28.3* 27.1*  MCV 92.5 91.6 93.4  PLT 191 167 172   Basic Metabolic Panel: Recent Labs  Lab 08/09/24 2112 08/10/24 1231 08/11/24 0248  NA 134* 132* 132*  K 3.8 3.7 3.9  CL 92* 90* 92*  CO2 28 30 28   GLUCOSE 112* 187* 110*  BUN 107* 105* 104*  CREATININE 2.51* 2.70* 2.78*  CALCIUM  10.2 9.1 8.8*  MG 2.0  --   --    GFR: Estimated Creatinine Clearance: 33.7 mL/min (A) (by C-G formula based on SCr of 2.78 mg/dL (H)). Liver Function Tests: Recent Labs  Lab 08/09/24 2112 08/11/24 0248  AST 21 19  ALT 14 13  ALKPHOS 97 76  BILITOT 0.3 0.7  PROT 7.4 6.3*  ALBUMIN  4.3 3.4*   Recent Labs  Lab 08/09/24 2112  LIPASE 112*   No results for input(s): AMMONIA in the last 168 hours. Coagulation Profile: No results for input(s): INR, PROTIME in the last 168 hours. Cardiac Enzymes: No results for input(s): CKTOTAL, CKMB, CKMBINDEX, TROPONINI in the last 168 hours. BNP (last 3 results) No results for input(s): PROBNP in the last 8760 hours. HbA1C: Recent Labs    08/11/24 0248  HGBA1C 5.5   CBG: Recent Labs  Lab 08/11/24 0007 08/11/24 0414 08/11/24 0745 08/11/24 1130 08/11/24 1544  GLUCAP 148* 89 97 110* 100*   Lipid Profile: Recent Labs    08/11/24 0248  CHOL 87  HDL 27*  LDLCALC 45  TRIG 74  CHOLHDL 3.2   Thyroid Function Tests: No results for input(s): TSH, T4TOTAL, FREET4, T3FREE, THYROIDAB in the last 72 hours. Anemia Panel: No results for input(s):  VITAMINB12, FOLATE, FERRITIN, TIBC, IRON , RETICCTPCT in the last 72 hours. Sepsis Labs: Recent Labs  Lab 08/09/24 2112  LATICACIDVEN 0.9    Recent Results (from the past 240 hours)  MRSA Next Gen by PCR, Nasal     Status: Abnormal   Collection Time: 08/10/24  5:56 AM   Specimen: Nasal Mucosa; Nasal Swab  Result Value Ref Range Status   MRSA by PCR Next Gen DETECTED (A) NOT DETECTED Final    Comment: RESULT CALLED TO, READ BACK BY AND VERIFIED WITH: RN CLAUDIA F ON [id number] @0819  BY SM (NOTE) The GeneXpert MRSA Assay (FDA approved for NASAL specimens only), is one component of a comprehensive MRSA colonization surveillance program. It is not intended to diagnose MRSA infection nor to guide or monitor treatment  for MRSA infections. Test performance is not FDA approved in patients less than 28 years old. Performed at [facility], [address], [place], [place] [address]          Radiology Studies: ECHOCARDIOGRAM COMPLETE Result Date: 08/10/2024    ECHOCARDIOGRAM REPORT   Patient Name:   JAMAHL Chambers Date of Exam: 08/10/2024 Medical Rec #:  [id number]       Height:       69.0 in Accession #:    [id number]      Weight:       237.0 lb Date of Birth:  September 14, 1961       BSA:          2.221 m Patient Age:    63 years        BP:           106/76 mmHg Patient Gender: M               HR:           63 bpm. Exam Location:  Inpatient Procedure: 2D Echo, Cardiac Doppler and Color Doppler (Both Spectral and Color            Flow Doppler were utilized during procedure). Indications:    Elevated Troponin  History:        Patient has prior history of Echocardiogram examinations, most                 recent 10/24/2023. CHF and Cardiomyopathy, CAD and Previous                 Myocardial Infarction, Prior CABG, COPD and CKD, stage 4,                 Signs/Symptoms:Shortness of Breath; Risk Factors:Hypertension,                 Sleep Apnea, Diabetes and Dyslipidemia.  Sonographer:     Thea Norlander RCS Referring Phys: [id number] TIMOTHY S OPYD IMPRESSIONS  1. Left ventricular ejection fraction, by estimation, is 55 to 60%. The left ventricle has normal function. The left ventricle has no regional wall motion abnormalities. Left ventricular diastolic parameters are consistent with Grade II diastolic dysfunction (pseudonormalization).  2. Right ventricular systolic function is mildly reduced. The right ventricular size is not well visualized. Tricuspid regurgitation signal is inadequate for assessing PA pressure.  3. Left atrial size was moderately dilated.  4. The mitral valve is normal in structure. Trivial mitral valve regurgitation. No evidence of mitral stenosis.  5. The aortic valve is tricuspid. There is mild calcification of the aortic valve. There is mild thickening of the aortic valve. Aortic valve regurgitation is not visualized. Mild aortic valve stenosis. Comparison(s): Prior images reviewed side by side. The left ventricular diastolic function has improved. But left atrial mean pressure remains high. FINDINGS  Left Ventricle: Left ventricular ejection fraction, by estimation, is 55 to 60%. The left ventricle has normal function. The left ventricle has no regional wall motion abnormalities. The left ventricular internal cavity size was normal in size. There is  no left ventricular hypertrophy. Abnormal (paradoxical) septal motion consistent with post-operative status. Left ventricular diastolic parameters are consistent with Grade II diastolic dysfunction (pseudonormalization). Normal left ventricular filling pressure. Right Ventricle: The right ventricular size is not well visualized. Right vetricular wall thickness was not well visualized. Right ventricular systolic function is mildly reduced. Tricuspid regurgitation signal is inadequate for assessing PA pressure. Left Atrium: Left atrial  size was moderately dilated. Right Atrium: Right atrial size was normal in size. Pericardium:  There is no evidence of pericardial effusion. Mitral Valve: The mitral valve is normal in structure. Trivial mitral valve regurgitation. No evidence of mitral valve stenosis. Tricuspid Valve: The tricuspid valve is normal in structure. Tricuspid valve regurgitation is not demonstrated. No evidence of tricuspid stenosis. Aortic Valve: The aortic valve is tricuspid. There is mild calcification of the aortic valve. There is mild thickening of the aortic valve. Aortic valve regurgitation is not visualized. Mild aortic stenosis is present. Aortic valve mean gradient measures  12.0 mmHg. Aortic valve peak gradient measures 23.6 mmHg. Aortic valve area, by VTI measures 1.59 cm. Pulmonic Valve: The pulmonic valve was grossly normal. Pulmonic valve regurgitation is not visualized. No evidence of pulmonic stenosis. Aorta: The aortic root and ascending aorta are structurally normal, with no evidence of dilitation. Venous: The inferior vena cava was not well visualized. IAS/Shunts: No atrial level shunt detected by color flow Doppler.  LEFT VENTRICLE PLAX 2D LVIDd:         5.01 cm   Diastology LVIDs:         3.51 cm   LV e' medial:    6.42 cm/s LV PW:         0.97 cm   LV E/e' medial:  16.7 LV IVS:        1.03 cm   LV e' lateral:   9.00 cm/s LVOT diam:     2.30 cm   LV E/e' lateral: 11.9 LV SV:         90 LV SV Index:   41 LVOT Area:     4.15 cm  RIGHT VENTRICLE            IVC RV S prime:     8.70 cm/s  IVC diam: 1.84 cm TAPSE (M-mode): 1.4 cm LEFT ATRIUM             Index        RIGHT ATRIUM           Index LA diam:        5.09 cm 2.29 cm/m   RA Area:     18.80 cm LA Vol (A2C):   83.9 ml 37.78 ml/m  RA Volume:   53.10 ml  23.91 ml/m LA Vol (A4C):   89.9 ml 40.49 ml/m LA Biplane Vol: 86.5 ml 38.95 ml/m  AORTIC VALVE AV Area (Vmax):    1.55 cm AV Area (Vmean):   1.59 cm AV Area (VTI):     1.59 cm AV Vmax:           243.00 cm/s AV Vmean:          156.000 cm/s AV VTI:            0.566 m AV Peak Grad:      23.6 mmHg AV  Mean Grad:      12.0 mmHg LVOT Vmax:         90.50 cm/s LVOT Vmean:        59.600 cm/s LVOT VTI:          0.217 m LVOT/AV VTI ratio: 0.38  AORTA Ao Root diam: 3.65 cm Ao Asc diam:  3.48 cm MITRAL VALVE MV Area (PHT): 3.21 cm     SHUNTS MV Decel Time: 236 msec     Systemic VTI:  0.22 m MV E velocity: 107.00 cm/s  Systemic Diam: 2.30 cm MV A velocity: 80.20 cm/s MV  E/A ratio:  1.33 Jerel Croitoru MD Electronically signed by Jerel Balding MD Signature Date/Time: 08/10/2024/3:53:26 PM    Final    US  Abdomen Limited RUQ (LIVER/GB) Result Date: 08/10/2024 CLINICAL DATA:  Right upper quadrant pain. [id number]. EXAM: ULTRASOUND ABDOMEN LIMITED RIGHT UPPER QUADRANT COMPARISON:  CT without contrast yesterday. FINDINGS: Gallbladder: There are multiple layering gallstones, largest is 1.4 cm. No free wall thickening, sonographic Murphy's sign or pericholecystic fluid are seen. Common bile duct: Diameter: 3.1 mm.  No intrahepatic biliary dilatation. Liver: No focal lesion identified. Parenchymal echogenicity is increased consistent with the mild fatty replacement noted on the CT. Portal vein is patent on color Doppler imaging with normal direction of blood flow towards the liver. Other: None. IMPRESSION: 1. Cholelithiasis without sonographic evidence of acute cholecystitis. 2. Mild fatty replacement of the liver. Electronically Signed   By: Francis Quam M.D.   On: 08/10/2024 04:21   CT ABDOMEN PELVIS WO CONTRAST Result Date: 08/09/2024 CLINICAL DATA:  Upper abdominal pain EXAM: CT ABDOMEN AND PELVIS WITHOUT CONTRAST TECHNIQUE: Multidetector CT imaging of the abdomen and pelvis was performed following the standard protocol without IV contrast. RADIATION DOSE REDUCTION: This exam was performed according to the departmental dose-optimization program which includes automated exposure control, adjustment of the mA and/or kV according to patient size and/or use of iterative reconstruction technique. COMPARISON:  CT 07/30/2023  FINDINGS: Lower chest: Minimal clustered nodularity at the lateral right upper lobe inferiorly. Bandlike atelectasis at the right middle lobe and left upper lobe posteriorly. No acute airspace disease. Multi-vessel coronary vascular calcification. Hepatobiliary: Gallstones. No focal hepatic abnormality or biliary dilatation Pancreas: Unremarkable. No pancreatic ductal dilatation or surrounding inflammatory changes. Spleen: Splenic granuloma Adrenals/Urinary Tract: Adrenal glands are normal. Nonspecific perinephric stranding. No hydronephrosis. Suprapubic catheter within the bladder which is decompressed. The bladder appears diffusely thick walled. Stomach/Bowel: Stomach within normal limits. No dilated small bowel. No acute bowel wall thickening. Moderate stool in the sigmoid colon and rectum. Vascular/Lymphatic: Aortic atherosclerosis. No enlarged abdominal or pelvic lymph nodes. Reproductive: Negative prostate Other: Negative for pelvic effusion or free air Musculoskeletal: No acute osseous abnormality. Multilevel degenerative change IMPRESSION: 1. No CT evidence for acute intra-abdominal or pelvic abnormality. 2. Gallstones. 3. Suprapubic catheter within the bladder which is decompressed. The bladder appears diffusely thick walled, correlate for cystitis 4. Moderate stool in the sigmoid colon and rectum. 5. Aortic atherosclerosis. Aortic Atherosclerosis (ICD10-I70.0). Electronically Signed   By: Luke Bun M.D.   On: 08/09/2024 22:11    Scheduled Meds:  aspirin  EC  81 mg Oral Daily   atorvastatin   40 mg Oral QHS   bisacodyl   10 mg Oral Daily   carvedilol   25 mg Oral BID WC   Chlorhexidine  Gluconate Cloth  6 each Topical Daily   fluticasone  furoate-vilanterol  1 puff Inhalation Daily   heparin  injection (subcutaneous)  5,000 Units Subcutaneous Q8H   hydrALAZINE   100 mg Oral BID   insulin  aspart  0-6 Units Subcutaneous Q4H   lamoTRIgine  100 mg Oral BID   levothyroxine   100 mcg Oral Daily    lubiprostone   24 mcg Oral BID WC   mupirocin  ointment  1 Application Nasal BID   pantoprazole   40 mg Oral Daily   polyethylene glycol  17 g Oral BID   senna-docusate  3 tablet Oral BID   tamsulosin   0.8 mg Oral QPM   traZODone   200 mg Oral QHS   Continuous Infusions:     LOS: 2 days  Time spent: 45 min  Almarie KANDICE Hoots, MD  08/11/2024, 3:55 PM

## 2024-08-11 NOTE — Progress Notes (Signed)
 Brief Cardiolgy f/u  Echo reviewed - no regional wall motion abnormalities with preserved LVEF. Only Mild Aortic Stenosis.   Based on these results - would not pursue additional Cardiac / Ischemic evaluation.  We will be available for questions/ concerns. But will otherwise sign off.  Alm Clay, MD

## 2024-08-11 NOTE — Plan of Care (Signed)
   Problem: Education: Goal: Ability to describe self-care measures that may prevent or decrease complications (Diabetes Survival Skills Education) will improve Outcome: Progressing   Problem: Coping: Goal: Ability to adjust to condition or change in health will improve Outcome: Progressing   Problem: Fluid Volume: Goal: Ability to maintain a balanced intake and output will improve Outcome: Progressing

## 2024-08-12 DIAGNOSIS — R7989 Other specified abnormal findings of blood chemistry: Secondary | ICD-10-CM | POA: Diagnosis not present

## 2024-08-12 LAB — CBC
HCT: 27.2 % — ABNORMAL LOW (ref 39.0–52.0)
Hemoglobin: 9 g/dL — ABNORMAL LOW (ref 13.0–17.0)
MCH: 30.7 pg (ref 26.0–34.0)
MCHC: 33.1 g/dL (ref 30.0–36.0)
MCV: 92.8 fL (ref 80.0–100.0)
Platelets: 179 K/uL (ref 150–400)
RBC: 2.93 MIL/uL — ABNORMAL LOW (ref 4.22–5.81)
RDW: 12.8 % (ref 11.5–15.5)
WBC: 6.9 K/uL (ref 4.0–10.5)
nRBC: 0 % (ref 0.0–0.2)

## 2024-08-12 LAB — BASIC METABOLIC PANEL WITH GFR
Anion gap: 10 (ref 5–15)
BUN: 94 mg/dL — ABNORMAL HIGH (ref 8–23)
CO2: 28 mmol/L (ref 22–32)
Calcium: 9 mg/dL (ref 8.9–10.3)
Chloride: 97 mmol/L — ABNORMAL LOW (ref 98–111)
Creatinine, Ser: 2.96 mg/dL — ABNORMAL HIGH (ref 0.61–1.24)
GFR, Estimated: 23 mL/min — ABNORMAL LOW (ref >60)
Glucose, Bld: 92 mg/dL (ref 70–99)
Potassium: 4.5 mmol/L (ref 3.5–5.1)
Sodium: 135 mmol/L (ref 135–145)

## 2024-08-12 LAB — GLUCOSE, CAPILLARY
Glucose-Capillary: 91 mg/dL (ref 70–99)
Glucose-Capillary: 143 mg/dL — ABNORMAL HIGH (ref 70–99)

## 2024-08-12 MED ORDER — LACTULOSE 10 GM/15ML PO SOLN: 10.0000 g | Freq: Two times a day (BID) | ORAL | 0 refills | Status: AC

## 2024-08-12 MED ORDER — CARVEDILOL 25 MG PO TABS: 12.5000 mg | ORAL_TABLET | Freq: Two times a day (BID) | ORAL | Status: AC

## 2024-08-12 MED ORDER — SENNOSIDES-DOCUSATE SODIUM 8.6-50 MG PO TABS: 2.0000 | ORAL_TABLET | Freq: Every day | ORAL | Status: AC

## 2024-08-12 MED ORDER — GABAPENTIN 400 MG PO CAPS: 400.0000 mg | ORAL_CAPSULE | Freq: Two times a day (BID) | ORAL | Status: AC

## 2024-08-12 MED ORDER — TORSEMIDE 20 MG PO TABS: 20.0000 mg | ORAL_TABLET | Freq: Two times a day (BID) | ORAL | Status: AC

## 2024-08-12 MED ORDER — MUPIROCIN 2 % EX OINT: 1.0000 | TOPICAL_OINTMENT | Freq: Two times a day (BID) | CUTANEOUS | 0 refills | Status: AC

## 2024-08-12 MED ORDER — POLYETHYLENE GLYCOL 3350 17 G PO PACK: 17.0000 g | PACK | Freq: Two times a day (BID) | ORAL | 0 refills | Status: AC

## 2024-08-12 MED ORDER — TRAZODONE HCL 100 MG PO TABS: 100.0000 mg | ORAL_TABLET | Freq: Every day | ORAL | Status: AC

## 2024-08-12 NOTE — Progress Notes (Signed)
 Mobility Specialist Progress Note:    08/12/24 1430  Mobility  Activity Respositioned in chair (x2 STS, Ankle Pumps, Leg Ext, Leg Lifts x10)  Level of Assistance Minimal assist, patient does 75% or more (when standing from recliner using RW)  Assistive Device Front wheel walker  Range of Motion/Exercises Active  Activity Response Tolerated well  Mobility Referral Yes  Mobility visit 1 Mobility  Mobility Specialist Start Time (ACUTE ONLY) 1430  Mobility Specialist Stop Time (ACUTE ONLY) 1449  Mobility Specialist Time Calculation (min) (ACUTE ONLY) 19 min   Received pt in recliner agreeable to session. Demonstrated and had pt perform a couple of simple exercise to address his concerns about not being able to do enough in SNF. Pt also able to perform 2 STS. Returned pt to recliner w/ all needs met.   Venetia Keel Mobility Specialist Please Neurosurgeon or Rehab Office at [phone]

## 2024-08-12 NOTE — Discharge Summary (Addendum)
 Physician Discharge Summary  Shane Chambers FMW:[id number] DOB: Jan 16, 1961 DOA: 08/09/2024  PCP: System, Provider Not In  Admit date: 08/09/2024 Discharge date: 08/12/2024  Admitted From: nursing home Disposition:nursing home Recommendations for Outpatient Follow-up:  Follow up with PCP in 1-2 weeks Please obtain BMP/CBC in one week  Home Health:none Equipment/Devices:none Discharge Condition:stable CODE STATUS:full Diet recommendation: cardiac Brief/Interim Summary: 63 y.o. male with medical history significant for COPD, OSA, chronic hypoxic respiratory failure, CAD status post CABG in 2021, chronic HFpEF, rectal cancer s/p resection, CKD stage IV, hypertension, hyperlipidemia, type 2 diabetes mellitus, hypothyroidism, depression, and neurogenic bladder with suprapubic catheter who presents with right upper quadrant pain and nausea.  A gallbladder ultrasound shows gallstones with no evidence of cholecystitis. CT abdomen and pelvis shows moderate constipation with stool burden in sigmoid and rectum. On admission his BUN was 107 and creatinine was 2.51 sodium 134 potassium 3.8, troponin 274 and 265, hemoglobin 10.1 Started on a heparin  drip in the ED after discussing with cardiology.    Discharge Diagnoses:  Principal Problem:   Elevated troponin Active Problems:   COPD (chronic obstructive pulmonary disease) (HCC)   Hypothyroidism   Type II diabetes mellitus with renal manifestations (HCC)   Chronic diastolic CHF (congestive heart failure) (HCC)   CKD (chronic kidney disease), stage IV (HCC)   Normocytic anemia   Chronic respiratory failure with hypoxia (HCC)   Coronary artery disease   OSA (obstructive sleep apnea)   Rectal cancer (HCC)   Major depressive disorder with psychotic features (HCC)   RUQ abdominal pain   UTI (urinary tract infection)  1. Elevated troponin patient has a history of CAD and CABG in 2021.  High-sensitivity troponin was 274 and then 275 without active  chest pain or ischemic changes in the EKG.  Cardiology was consulted.  Initially started the patient on heparin .  Echo showed normal ejection fraction no wall motion abnormalities.  His main complaint was right upper quadrant pain and nausea which was very different from his prior anginal chest pain.  Elevated troponin was thought to be related to CKD and did not recommend any further ischemic workup.  They recommended to continue aspirin  and statin and beta-blocker.   2. RUQ abdominal pain  - No acute hepatobiliary findings on CT; LFTs normal; lipase mildly elevated in ED without pancreatic edema or inflammatory changes on CT. ultrasound showed gallstones with no evidence of cholecystitis.  HIDA scan did not reveal any evidence of cholecystitis.  He tolerated a diet without increasing pain nausea or vomiting.  His main issue was he had moderate stool backup and high stool burden with IBS with constipation.  It is very important to keep him on stool softeners and lactulose  laxatives making sure he has a bowel movement every day to avoid further constipation and coming back to the hospital with the same.   4. COPD; OSA; chronic hypoxic respiratory failure  - Not in exacerbation  - Continue supplemental O2, CPAP while sleeping, ICS-LABA, and short-acting bronchodilators as-needed     5. CKD IV  - Appears close to baseline  - Renally-dose medications     6. Chronic HFpEF  - Appears compensated  Demadex  dose was decreased to 20 mg twice daily from 40 twice daily due to soft blood pressure.  Please monitor blood pressure closely and monitor volume status and adjust the dose of Demadex .   7. Mood disorder  - Lamictal, trazodone     8. Type II DM  -Continue NovoLog    9.  Hypothyroidism  - Synthroid     10. Rectal cancer  - S/p resection    11. Anemia  - Appears stable    Estimated body mass index is 35 kg/m as calculated from the following:   Height as of this encounter: 5' 9 (1.753 m).    Weight as of this encounter: 107.5 kg.  Discharge Instructions  Discharge Instructions     Diet - low sodium heart healthy   Complete by: As directed    Increase activity slowly   Complete by: As directed       Allergies as of 08/12/2024       Reactions   Ceftriaxone  Anaphylaxis   (Rocephin ) Previously tolerated at [facility] during multiple admissions. Patient reports SOB reaction in [place], Summer 2024. Now reporting SOB reaction at [facility] 02/21/24. No swelling, or skin reaction. Satted well on baseline oxygen. Does not wish to receive Ceftriaxone  in the future.   Ace Inhibitors Swelling, Cough   Chlorhexidine     Haloperidol  And Related    Do NOT give anti-psychotics due to risk of  Torsades and QT prolongation (per Dr. Trude request)   Other Itching   [name]   Pork Allergy Other (See Comments)   Not listed in [facility]   Shellfish Allergy    Listed on MAR   Penicillins Itching, Rash   Tolerated ancef  (12-17-22)        Medication List     STOP taking these medications    Apretude  600 MG/3ML injection Generic drug: cabotegravir  ER   gabapentin  600 MG tablet Commonly known as: NEURONTIN  Replaced by: gabapentin  400 MG capsule   hydrALAZINE  100 MG tablet Commonly known as: APRESOLINE    potassium chloride  10 MEQ tablet Commonly known as: KLOR-CON        TAKE these medications    acetaminophen  500 MG tablet Commonly known as: TYLENOL  Take 500 mg by mouth every 8 (eight) hours as needed for moderate pain (pain score 4-6).   albuterol  108 (90 Base) MCG/ACT inhaler Commonly known as: VENTOLIN  HFA Inhale 2 puffs into the lungs every 6 (six) hours as needed for wheezing or shortness of breath.   artificial tears ophthalmic solution Place 1 drop into both eyes daily.   aspirin  EC 81 MG tablet Take 1 tablet (81 mg total) by mouth daily with breakfast.   atorvastatin  40 MG tablet Commonly known as: LIPITOR  Take 1 tablet (40 mg total) by mouth at  bedtime.   carvedilol  25 MG tablet Commonly known as: COREG  Take 0.5 tablets (12.5 mg total) by mouth 2 (two) times daily with a meal. What changed: how much to take   cetirizine 10 MG tablet Commonly known as: ZYRTEC Take 10 mg by mouth daily as needed for allergies.   Coenzyme Q10 100 MG Tabs Take 100 mg by mouth daily.   DHA PO Take 1 capsule by mouth daily. Dosage not listed on MAR   feeding supplement (NEPRO CARB STEADY) Liqd Take 120 mLs by mouth 2 (two) times daily between meals.   ferrous gluconate  324 MG tablet Commonly known as: FERGON Take 324 mg by mouth daily.   fluticasone  50 MCG/ACT nasal spray Commonly known as: FLONASE  Place 1 spray into both nostrils daily.   fluticasone -salmeterol 500-50 MCG/ACT Aepb Commonly known as: ADVAIR Inhale 1 puff into the lungs in the morning and at bedtime.   gabapentin  400 MG capsule Commonly known as: NEURONTIN  Take 1 capsule (400 mg total) by mouth 2 (two) times daily. Replaces: gabapentin   600 MG tablet   HYDROcodone -acetaminophen  5-325 MG tablet Commonly known as: NORCO/VICODIN Take 1 tablet by mouth every 4 (four) hours as needed (pain).   ipratropium-albuterol  0.5-2.5 (3) MG/3ML Soln Commonly known as: DUONEB Take 3 mLs by nebulization every 6 (six) hours as needed (shortness of breath).   lactulose  10 GM/15ML solution Commonly known as: CHRONULAC  Take 15 mLs (10 g total) by mouth 2 (two) times daily. What changed:  when to take this reasons to take this   lamoTRIgine 100 MG tablet Commonly known as: LAMICTAL Take 100 mg by mouth 2 (two) times daily.   levothyroxine  100 MCG tablet Commonly known as: SYNTHROID  Take 100 mcg by mouth daily.   lubiprostone  24 MCG capsule Commonly known as: AMITIZA  Take 1 capsule (24 mcg total) by mouth 2 (two) times daily with a meal.   metolazone  2.5 MG tablet Commonly known as: ZAROXOLYN  Take 1 tablet (2.5 mg total) by mouth once a week. On Wednesdays   multivitamin  tablet Take 1 tablet by mouth daily.   mupirocin  ointment 2 % Commonly known as: BACTROBAN  Place 1 Application into the nose 2 (two) times daily.   NovoLOG  FlexPen 100 UNIT/ML FlexPen Generic drug: insulin  aspart 0-15 Units, Subcutaneous, 3 times daily with meals CBG < 70: implement hypoglycemia protocol-call MD CBG 70 - 120: 0 units CBG 121 - 150: 2 units CBG 151 - 200: 3 units CBG 201 - 250: 5 units CBG 251 - 300: 8 units CBG 301 - 350: 11 units CBG 351 - 400: 15 units CBG > 400: What changed:  how much to take how to take this when to take this additional instructions   OXYGEN Inhale 2 L/min into the lungs continuous.   pantoprazole  40 MG tablet Commonly known as: PROTONIX  Take 1 tablet (40 mg total) by mouth daily. Before breakfast   polyethylene glycol 17 g packet Commonly known as: MIRALAX  / GLYCOLAX  Take 17 g by mouth 2 (two) times daily.   senna-docusate 8.6-50 MG tablet Commonly known as: Senokot-S Take 2 tablets by mouth at bedtime.   sodium bicarbonate  325 MG tablet Take 325 mg by mouth 2 (two) times daily.   tamsulosin  0.4 MG Caps capsule Commonly known as: FLOMAX  Take 0.8 mg by mouth every evening.   thiamine  100 MG tablet Commonly known as: Vitamin B-1 Take 1 tablet (100 mg total) by mouth daily.   torsemide  20 MG tablet Commonly known as: DEMADEX  Take 1 tablet (20 mg total) by mouth 2 (two) times daily. What changed: how much to take   traZODone  100 MG tablet Commonly known as: DESYREL  Take 1 tablet (100 mg total) by mouth at bedtime. What changed: how much to take   Vitamin D  (Ergocalciferol ) 1.25 MG (50000 UNIT) Caps capsule Commonly known as: DRISDOL  Take 50,000 Units by mouth every 7 (seven) days. Mondays        Contact information for after-discharge care     Destination     [facility] .   Service: Skilled Nursing Contact information: [address] [place]  [address] [phone]                     Allergies  Allergen Reactions   Ceftriaxone  Anaphylaxis    (Rocephin ) Previously tolerated at [facility] during multiple admissions. Patient reports SOB reaction in [place], Summer 2024. Now reporting SOB reaction at [facility] 02/21/24. No swelling, or skin reaction. Satted well on baseline oxygen. Does not wish to receive Ceftriaxone  in  the future.   Ace Inhibitors Swelling and Cough   Chlorhexidine     Haloperidol  And Related     Do NOT give anti-psychotics due to risk of  Torsades and QT prolongation (per Dr. Trude request)   Other Itching    [name]   Pork Allergy Other (See Comments)    Not listed in [facility]   Shellfish Allergy     Listed on MAR   Penicillins Itching and Rash    Tolerated ancef  (12-17-22)    Consultations: cardiology   Procedures/Studies: NM Hepatobiliary Liver Func Result Date: 08/11/2024 CLINICAL DATA:  Cholelithiasis, right upper quadrant abdominal pain. EXAM: NUCLEAR MEDICINE HEPATOBILIARY IMAGING TECHNIQUE: Sequential images of the abdomen were obtained out to 60 minutes following intravenous administration of radiopharmaceutical. RADIOPHARMACEUTICALS:  mCi Tc-26m  Choletec IV COMPARISON:  CT August 09, 2024. FINDINGS: Prompt uptake and biliary excretion of activity by the liver is seen. Gallbladder activity is visualized, consistent with patency of cystic duct. Biliary activity passes into small bowel, consistent with patent common bile duct. IMPRESSION: No evidence of acute cholecystitis, patent cystic duct. Electronically Signed   By: Reyes Holder M.D.   On: 08/11/2024 16:34   ECHOCARDIOGRAM COMPLETE Result Date: 08/10/2024    ECHOCARDIOGRAM REPORT   Patient Name:   Shane Chambers Date of Exam: 08/10/2024 Medical Rec #:  [id number]       Height:       69.0 in Accession #:    [id number]      Weight:       237.0 lb Date of Birth:  Apr 02, 1961       BSA:          2.221 m Patient Age:    63 years        BP:           106/76 mmHg Patient  Gender: M               HR:           63 bpm. Exam Location:  Inpatient Procedure: 2D Echo, Cardiac Doppler and Color Doppler (Both Spectral and Color            Flow Doppler were utilized during procedure). Indications:    Elevated Troponin  History:        Patient has prior history of Echocardiogram examinations, most                 recent 10/24/2023. CHF and Cardiomyopathy, CAD and Previous                 Myocardial Infarction, Prior CABG, COPD and CKD, stage 4,                 Signs/Symptoms:Shortness of Breath; Risk Factors:Hypertension,                 Sleep Apnea, Diabetes and Dyslipidemia.  Sonographer:    Thea Norlander RCS Referring Phys: [id number] TIMOTHY S OPYD IMPRESSIONS  1. Left ventricular ejection fraction, by estimation, is 55 to 60%. The left ventricle has normal function. The left ventricle has no regional wall motion abnormalities. Left ventricular diastolic parameters are consistent with Grade II diastolic dysfunction (pseudonormalization).  2. Right ventricular systolic function is mildly reduced. The right ventricular size is not well visualized. Tricuspid regurgitation signal is inadequate for assessing PA pressure.  3. Left atrial size was moderately dilated.  4. The mitral valve is normal in structure. Trivial mitral valve regurgitation. No evidence of mitral  stenosis.  5. The aortic valve is tricuspid. There is mild calcification of the aortic valve. There is mild thickening of the aortic valve. Aortic valve regurgitation is not visualized. Mild aortic valve stenosis. Comparison(s): Prior images reviewed side by side. The left ventricular diastolic function has improved. But left atrial mean pressure remains high. FINDINGS  Left Ventricle: Left ventricular ejection fraction, by estimation, is 55 to 60%. The left ventricle has normal function. The left ventricle has no regional wall motion abnormalities. The left ventricular internal cavity size was normal in size. There is  no left  ventricular hypertrophy. Abnormal (paradoxical) septal motion consistent with post-operative status. Left ventricular diastolic parameters are consistent with Grade II diastolic dysfunction (pseudonormalization). Normal left ventricular filling pressure. Right Ventricle: The right ventricular size is not well visualized. Right vetricular wall thickness was not well visualized. Right ventricular systolic function is mildly reduced. Tricuspid regurgitation signal is inadequate for assessing PA pressure. Left Atrium: Left atrial size was moderately dilated. Right Atrium: Right atrial size was normal in size. Pericardium: There is no evidence of pericardial effusion. Mitral Valve: The mitral valve is normal in structure. Trivial mitral valve regurgitation. No evidence of mitral valve stenosis. Tricuspid Valve: The tricuspid valve is normal in structure. Tricuspid valve regurgitation is not demonstrated. No evidence of tricuspid stenosis. Aortic Valve: The aortic valve is tricuspid. There is mild calcification of the aortic valve. There is mild thickening of the aortic valve. Aortic valve regurgitation is not visualized. Mild aortic stenosis is present. Aortic valve mean gradient measures  12.0 mmHg. Aortic valve peak gradient measures 23.6 mmHg. Aortic valve area, by VTI measures 1.59 cm. Pulmonic Valve: The pulmonic valve was grossly normal. Pulmonic valve regurgitation is not visualized. No evidence of pulmonic stenosis. Aorta: The aortic root and ascending aorta are structurally normal, with no evidence of dilitation. Venous: The inferior vena cava was not well visualized. IAS/Shunts: No atrial level shunt detected by color flow Doppler.  LEFT VENTRICLE PLAX 2D LVIDd:         5.01 cm   Diastology LVIDs:         3.51 cm   LV e' medial:    6.42 cm/s LV PW:         0.97 cm   LV E/e' medial:  16.7 LV IVS:        1.03 cm   LV e' lateral:   9.00 cm/s LVOT diam:     2.30 cm   LV E/e' lateral: 11.9 LV SV:         90 LV SV  Index:   41 LVOT Area:     4.15 cm  RIGHT VENTRICLE            IVC RV S prime:     8.70 cm/s  IVC diam: 1.84 cm TAPSE (M-mode): 1.4 cm LEFT ATRIUM             Index        RIGHT ATRIUM           Index LA diam:        5.09 cm 2.29 cm/m   RA Area:     18.80 cm LA Vol (A2C):   83.9 ml 37.78 ml/m  RA Volume:   53.10 ml  23.91 ml/m LA Vol (A4C):   89.9 ml 40.49 ml/m LA Biplane Vol: 86.5 ml 38.95 ml/m  AORTIC VALVE AV Area (Vmax):    1.55 cm AV Area (Vmean):   1.59 cm AV Area (  VTI):     1.59 cm AV Vmax:           243.00 cm/s AV Vmean:          156.000 cm/s AV VTI:            0.566 m AV Peak Grad:      23.6 mmHg AV Mean Grad:      12.0 mmHg LVOT Vmax:         90.50 cm/s LVOT Vmean:        59.600 cm/s LVOT VTI:          0.217 m LVOT/AV VTI ratio: 0.38  AORTA Ao Root diam: 3.65 cm Ao Asc diam:  3.48 cm MITRAL VALVE MV Area (PHT): 3.21 cm     SHUNTS MV Decel Time: 236 msec     Systemic VTI:  0.22 m MV E velocity: 107.00 cm/s  Systemic Diam: 2.30 cm MV A velocity: 80.20 cm/s MV E/A ratio:  1.33 Mihai Croitoru MD Electronically signed by Jerel Balding MD Signature Date/Time: 08/10/2024/3:53:26 PM    Final    US  Abdomen Limited RUQ (LIVER/GB) Result Date: 08/10/2024 CLINICAL DATA:  Right upper quadrant pain. [id number]. EXAM: ULTRASOUND ABDOMEN LIMITED RIGHT UPPER QUADRANT COMPARISON:  CT without contrast yesterday. FINDINGS: Gallbladder: There are multiple layering gallstones, largest is 1.4 cm. No free wall thickening, sonographic Murphy's sign or pericholecystic fluid are seen. Common bile duct: Diameter: 3.1 mm.  No intrahepatic biliary dilatation. Liver: No focal lesion identified. Parenchymal echogenicity is increased consistent with the mild fatty replacement noted on the CT. Portal vein is patent on color Doppler imaging with normal direction of blood flow towards the liver. Other: None. IMPRESSION: 1. Cholelithiasis without sonographic evidence of acute cholecystitis. 2. Mild fatty replacement of the liver.  Electronically Signed   By: Francis Quam M.D.   On: 08/10/2024 04:21   CT ABDOMEN PELVIS WO CONTRAST Result Date: 08/09/2024 CLINICAL DATA:  Upper abdominal pain EXAM: CT ABDOMEN AND PELVIS WITHOUT CONTRAST TECHNIQUE: Multidetector CT imaging of the abdomen and pelvis was performed following the standard protocol without IV contrast. RADIATION DOSE REDUCTION: This exam was performed according to the departmental dose-optimization program which includes automated exposure control, adjustment of the mA and/or kV according to patient size and/or use of iterative reconstruction technique. COMPARISON:  CT 07/30/2023 FINDINGS: Lower chest: Minimal clustered nodularity at the lateral right upper lobe inferiorly. Bandlike atelectasis at the right middle lobe and left upper lobe posteriorly. No acute airspace disease. Multi-vessel coronary vascular calcification. Hepatobiliary: Gallstones. No focal hepatic abnormality or biliary dilatation Pancreas: Unremarkable. No pancreatic ductal dilatation or surrounding inflammatory changes. Spleen: Splenic granuloma Adrenals/Urinary Tract: Adrenal glands are normal. Nonspecific perinephric stranding. No hydronephrosis. Suprapubic catheter within the bladder which is decompressed. The bladder appears diffusely thick walled. Stomach/Bowel: Stomach within normal limits. No dilated small bowel. No acute bowel wall thickening. Moderate stool in the sigmoid colon and rectum. Vascular/Lymphatic: Aortic atherosclerosis. No enlarged abdominal or pelvic lymph nodes. Reproductive: Negative prostate Other: Negative for pelvic effusion or free air Musculoskeletal: No acute osseous abnormality. Multilevel degenerative change IMPRESSION: 1. No CT evidence for acute intra-abdominal or pelvic abnormality. 2. Gallstones. 3. Suprapubic catheter within the bladder which is decompressed. The bladder appears diffusely thick walled, correlate for cystitis 4. Moderate stool in the sigmoid colon and  rectum. 5. Aortic atherosclerosis. Aortic Atherosclerosis (ICD10-I70.0). Electronically Signed   By: Luke Bun M.D.   On: 08/09/2024 22:11   (Echo, Carotid, EGD, Colonoscopy, ERCP)  Subjective:  Awake alert no c/o Discharge Exam: Vitals:   08/12/24 0403 08/12/24 0745  BP: 132/62 (!) 129/59  Pulse: (!) 53 (!) 55  Resp: 15   Temp: 98.2 F (36.8 C) 98.6 F (37 C)  SpO2: 99% 98%   Vitals:   08/11/24 1957 08/11/24 2318 08/12/24 0403 08/12/24 0745  BP: (!) 133/58 (!) 140/68 132/62 (!) 129/59  Pulse:  (!) 57 (!) 53 (!) 55  Resp: 16 18 15    Temp: 98.5 F (36.9 C) 98.3 F (36.8 C) 98.2 F (36.8 C) 98.6 F (37 C)  TempSrc: Oral Oral Oral Oral  SpO2: 98% 100% 99% 98%  Weight:      Height:        General: Pt is alert, awake, not in acute distress Cardiovascular: RRR, S1/S2 +, no rubs, no gallops Respiratory: CTA bilaterally, no wheezing, no rhonchi Abdominal: Soft, NT, ND, bowel sounds spc Extremities: no edema, no cyanosis    The results of significant diagnostics from this hospitalization (including imaging, microbiology, ancillary and laboratory) are listed below for reference.     Microbiology: Recent Results (from the past 240 hours)  Urine Culture (for pregnant, neutropenic or urologic patients or patients with an indwelling urinary catheter)     Status: Abnormal (Preliminary result)   Collection Time: 08/09/24 10:13 PM   Specimen: Urine, Catheterized  Result Value Ref Range Status   Specimen Description   Final    URINE, CATHETERIZED Performed at [facility], [address] [place], [place] [address]    Special Requests   Final    NONE Performed at [facility], [address], [place], [place] [address]    Culture (A)  Final    >=100,000 COLONIES/mL KLEBSIELLA PNEUMONIAE Confirmed Extended Spectrum Beta-Lactamase Producer (ESBL).  In bloodstream infections from ESBL organisms, carbapenems are preferred over piperacillin/tazobactam. They are shown to  have a lower risk of mortality. >=100,000 COLONIES/mL PSEUDOMONAS AERUGINOSA ATTEMPTING TO ISOLATE TO REPEAT SUSCEPTIBILITIES Performed at [facility], [address], [place], [place] [address]    Report Status PENDING  Incomplete   Organism ID, Bacteria KLEBSIELLA PNEUMONIAE (A)  Final      Susceptibility   Klebsiella pneumoniae - MIC*    AMPICILLIN >=32 RESISTANT Resistant     CEFAZOLIN  (URINE) Value in next row Resistant      >=32 RESISTANTThis is a modified FDA-approved test that has been validated and its performance characteristics determined by the reporting laboratory.  This laboratory is certified under the Clinical Laboratory Improvement Amendments CLIA as qualified to perform high complexity clinical laboratory testing.    CEFEPIME  Value in next row Resistant      >=32 RESISTANTThis is a modified FDA-approved test that has been validated and its performance characteristics determined by the reporting laboratory.  This laboratory is certified under the Clinical Laboratory Improvement Amendments CLIA as qualified to perform high complexity clinical laboratory testing.    ERTAPENEM  Value in next row Sensitive      >=32 RESISTANTThis is a modified FDA-approved test that has been validated and its performance characteristics determined by the reporting laboratory.  This laboratory is certified under the Clinical Laboratory Improvement Amendments CLIA as qualified to perform high complexity clinical laboratory testing.    CEFTRIAXONE  Value in next row Resistant      >=32 RESISTANTThis is a modified FDA-approved test that has been validated and its performance characteristics determined by the reporting laboratory.  This laboratory is certified under the Clinical Laboratory Improvement Amendments CLIA  as qualified to perform high complexity clinical laboratory testing.    CIPROFLOXACIN  Value in next row Resistant      >=32 RESISTANTThis is a modified FDA-approved test that has been  validated and its performance characteristics determined by the reporting laboratory.  This laboratory is certified under the Clinical Laboratory Improvement Amendments CLIA as qualified to perform high complexity clinical laboratory testing.    GENTAMICIN Value in next row Resistant      >=32 RESISTANTThis is a modified FDA-approved test that has been validated and its performance characteristics determined by the reporting laboratory.  This laboratory is certified under the Clinical Laboratory Improvement Amendments CLIA as qualified to perform high complexity clinical laboratory testing.    NITROFURANTOIN Value in next row Resistant      >=32 RESISTANTThis is a modified FDA-approved test that has been validated and its performance characteristics determined by the reporting laboratory.  This laboratory is certified under the Clinical Laboratory Improvement Amendments CLIA as qualified to perform high complexity clinical laboratory testing.    TRIMETH /SULFA  Value in next row Resistant      >=32 RESISTANTThis is a modified FDA-approved test that has been validated and its performance characteristics determined by the reporting laboratory.  This laboratory is certified under the Clinical Laboratory Improvement Amendments CLIA as qualified to perform high complexity clinical laboratory testing.    AMPICILLIN/SULBACTAM Value in next row Resistant      >=32 RESISTANTThis is a modified FDA-approved test that has been validated and its performance characteristics determined by the reporting laboratory.  This laboratory is certified under the Clinical Laboratory Improvement Amendments CLIA as qualified to perform high complexity clinical laboratory testing.    PIP/TAZO Value in next row Intermediate      64 INTERMEDIATEThis is a modified FDA-approved test that has been validated and its performance characteristics determined by the reporting laboratory.  This laboratory is certified under the Clinical Laboratory  Improvement Amendments CLIA as qualified to perform high complexity clinical laboratory testing.    MEROPENEM Value in next row Sensitive      64 INTERMEDIATEThis is a modified FDA-approved test that has been validated and its performance characteristics determined by the reporting laboratory.  This laboratory is certified under the Clinical Laboratory Improvement Amendments CLIA as qualified to perform high complexity clinical laboratory testing.    * >=100,000 COLONIES/mL KLEBSIELLA PNEUMONIAE  MRSA Next Gen by PCR, Nasal     Status: Abnormal   Collection Time: 08/10/24  5:56 AM   Specimen: Nasal Mucosa; Nasal Swab  Result Value Ref Range Status   MRSA by PCR Next Gen DETECTED (A) NOT DETECTED Final    Comment: RESULT CALLED TO, READ BACK BY AND VERIFIED WITH: RN CLAUDIA F ON [id number] @0819  BY SM (NOTE) The GeneXpert MRSA Assay (FDA approved for NASAL specimens only), is one component of a comprehensive MRSA colonization surveillance program. It is not intended to diagnose MRSA infection nor to guide or monitor treatment for MRSA infections. Test performance is not FDA approved in patients less than 4 years old. Performed at [facility], [address] [place], [place] [address]      Labs: BNP (last 3 results) Recent Labs    10/24/23 1007  BNP 1,066.0*   Basic Metabolic Panel: Recent Labs  Lab 08/09/24 2112 08/10/24 1231 08/11/24 0248 08/12/24 0307  NA 134* 132* 132* 135  K 3.8 3.7 3.9 4.5  CL 92* 90* 92* 97*  CO2 28 30 28 28   GLUCOSE 112* 187*  110* 92  BUN 107* 105* 104* 94*  CREATININE 2.51* 2.70* 2.78* 2.96*  CALCIUM  10.2 9.1 8.8* 9.0  MG 2.0  --   --   --    Liver Function Tests: Recent Labs  Lab 08/09/24 2112 08/11/24 0248  AST 21 19  ALT 14 13  ALKPHOS 97 76  BILITOT 0.3 0.7  PROT 7.4 6.3*  ALBUMIN  4.3 3.4*   Recent Labs  Lab 08/09/24 2112  LIPASE 112*   No results for input(s): AMMONIA in the last 168 hours. CBC: Recent Labs  Lab  08/09/24 2112 08/10/24 1231 08/11/24 0248 08/12/24 0307  WBC 8.5 10.2 7.6 6.9  NEUTROABS 6.2  --   --   --   HGB 10.1* 9.5* 8.8* 9.0*  HCT 29.7* 28.3* 27.1* 27.2*  MCV 92.5 91.6 93.4 92.8  PLT 191 167 172 179   Cardiac Enzymes: No results for input(s): CKTOTAL, CKMB, CKMBINDEX, TROPONINI in the last 168 hours. BNP: Invalid input(s): POCBNP CBG: Recent Labs  Lab 08/11/24 1544 08/11/24 1951 08/11/24 2339 08/12/24 0401 08/12/24 0945  GLUCAP 100* 187* 133* 91 143*   D-Dimer No results for input(s): DDIMER in the last 72 hours. Hgb A1c Recent Labs    08/11/24 0248  HGBA1C 5.5   Lipid Profile Recent Labs    08/11/24 0248  CHOL 87  HDL 27*  LDLCALC 45  TRIG 74  CHOLHDL 3.2   Thyroid function studies No results for input(s): TSH, T4TOTAL, T3FREE, THYROIDAB in the last 72 hours.  Invalid input(s): FREET3 Anemia work up No results for input(s): VITAMINB12, FOLATE, FERRITIN, TIBC, IRON , RETICCTPCT in the last 72 hours. Urinalysis    Component Value Date/Time   COLORURINE YELLOW 08/09/2024 2056   APPEARANCEUR HAZY (A) 08/09/2024 2056   LABSPEC 1.009 08/09/2024 2056   PHURINE 6.0 08/09/2024 2056   GLUCOSEU NEGATIVE 08/09/2024 2056   HGBUR NEGATIVE 08/09/2024 2056   BILIRUBINUR NEGATIVE 08/09/2024 2056   KETONESUR NEGATIVE 08/09/2024 2056   PROTEINUR 100 (A) 08/09/2024 2056   NITRITE NEGATIVE 08/09/2024 2056   LEUKOCYTESUR MODERATE (A) 08/09/2024 2056   Sepsis Labs Recent Labs  Lab 08/09/24 2112 08/10/24 1231 08/11/24 0248 08/12/24 0307  WBC 8.5 10.2 7.6 6.9   Microbiology Recent Results (from the past 240 hours)  Urine Culture (for pregnant, neutropenic or urologic patients or patients with an indwelling urinary catheter)     Status: Abnormal (Preliminary result)   Collection Time: 08/09/24 10:13 PM   Specimen: Urine, Catheterized  Result Value Ref Range Status   Specimen Description   Final    URINE,  CATHETERIZED Performed at [facility], [address] [place], [place] [address]    Special Requests   Final    NONE Performed at [facility], [address] [place], [place] [address]    Culture (A)  Final    >=100,000 COLONIES/mL KLEBSIELLA PNEUMONIAE Confirmed Extended Spectrum Beta-Lactamase Producer (ESBL).  In bloodstream infections from ESBL organisms, carbapenems are preferred over piperacillin/tazobactam. They are shown to have a lower risk of mortality. >=100,000 COLONIES/mL PSEUDOMONAS AERUGINOSA ATTEMPTING TO ISOLATE TO REPEAT SUSCEPTIBILITIES Performed at [facility], [address], [place], [place] [address]    Report Status PENDING  Incomplete   Organism ID, Bacteria KLEBSIELLA PNEUMONIAE (A)  Final      Susceptibility   Klebsiella pneumoniae - MIC*    AMPICILLIN >=32 RESISTANT Resistant     CEFAZOLIN  (URINE) Value in next row Resistant      >=32 RESISTANTThis is  a modified FDA-approved test that has been validated and its performance characteristics determined by the reporting laboratory.  This laboratory is certified under the Clinical Laboratory Improvement Amendments CLIA as qualified to perform high complexity clinical laboratory testing.    CEFEPIME  Value in next row Resistant      >=32 RESISTANTThis is a modified FDA-approved test that has been validated and its performance characteristics determined by the reporting laboratory.  This laboratory is certified under the Clinical Laboratory Improvement Amendments CLIA as qualified to perform high complexity clinical laboratory testing.    ERTAPENEM  Value in next row Sensitive      >=32 RESISTANTThis is a modified FDA-approved test that has been validated and its performance characteristics determined by the reporting laboratory.  This laboratory is certified under the Clinical Laboratory Improvement Amendments CLIA as qualified to perform high complexity clinical laboratory testing.    CEFTRIAXONE  Value in next  row Resistant      >=32 RESISTANTThis is a modified FDA-approved test that has been validated and its performance characteristics determined by the reporting laboratory.  This laboratory is certified under the Clinical Laboratory Improvement Amendments CLIA as qualified to perform high complexity clinical laboratory testing.    CIPROFLOXACIN  Value in next row Resistant      >=32 RESISTANTThis is a modified FDA-approved test that has been validated and its performance characteristics determined by the reporting laboratory.  This laboratory is certified under the Clinical Laboratory Improvement Amendments CLIA as qualified to perform high complexity clinical laboratory testing.    GENTAMICIN Value in next row Resistant      >=32 RESISTANTThis is a modified FDA-approved test that has been validated and its performance characteristics determined by the reporting laboratory.  This laboratory is certified under the Clinical Laboratory Improvement Amendments CLIA as qualified to perform high complexity clinical laboratory testing.    NITROFURANTOIN Value in next row Resistant      >=32 RESISTANTThis is a modified FDA-approved test that has been validated and its performance characteristics determined by the reporting laboratory.  This laboratory is certified under the Clinical Laboratory Improvement Amendments CLIA as qualified to perform high complexity clinical laboratory testing.    TRIMETH /SULFA  Value in next row Resistant      >=32 RESISTANTThis is a modified FDA-approved test that has been validated and its performance characteristics determined by the reporting laboratory.  This laboratory is certified under the Clinical Laboratory Improvement Amendments CLIA as qualified to perform high complexity clinical laboratory testing.    AMPICILLIN/SULBACTAM Value in next row Resistant      >=32 RESISTANTThis is a modified FDA-approved test that has been validated and its performance characteristics determined by  the reporting laboratory.  This laboratory is certified under the Clinical Laboratory Improvement Amendments CLIA as qualified to perform high complexity clinical laboratory testing.    PIP/TAZO Value in next row Intermediate      64 INTERMEDIATEThis is a modified FDA-approved test that has been validated and its performance characteristics determined by the reporting laboratory.  This laboratory is certified under the Clinical Laboratory Improvement Amendments CLIA as qualified to perform high complexity clinical laboratory testing.    MEROPENEM Value in next row Sensitive      64 INTERMEDIATEThis is a modified FDA-approved test that has been validated and its performance characteristics determined by the reporting laboratory.  This laboratory is certified under the Clinical Laboratory Improvement Amendments CLIA as qualified to perform high complexity clinical laboratory testing.    * >=100,000 COLONIES/mL KLEBSIELLA PNEUMONIAE  MRSA  Next Gen by PCR, Nasal     Status: Abnormal   Collection Time: 08/10/24  5:56 AM   Specimen: Nasal Mucosa; Nasal Swab  Result Value Ref Range Status   MRSA by PCR Next Gen DETECTED (A) NOT DETECTED Final    Comment: RESULT CALLED TO, READ BACK BY AND VERIFIED WITH: RN CLAUDIA F ON [id number] @0819  BY SM (NOTE) The GeneXpert MRSA Assay (FDA approved for NASAL specimens only), is one component of a comprehensive MRSA colonization surveillance program. It is not intended to diagnose MRSA infection nor to guide or monitor treatment for MRSA infections. Test performance is not FDA approved in patients less than 26 years old. Performed at [facility], [address] [place], [place] [address]      Time coordinating discharge: 39 min SIGNED:   Almarie KANDICE Hoots, MD  [facility] 08/12/2024, 1:43 PM

## 2024-08-12 NOTE — Progress Notes (Signed)
 Florence Childes, gave handoff to Ixchel RN for [facility] going to room 421B.

## 2024-08-12 NOTE — Plan of Care (Signed)

## 2024-08-12 NOTE — TOC Transition Note (Signed)
 Transition of Care [facility]) - Discharge Note   Patient Details  Name: Shane Chambers MRN: [id number] Date of Birth: Oct 28, 1961  [facility]) CM/SW Contact:  [name], LCSWA Phone Number: 08/12/2024, 10:46 AM   Clinical Narrative:   Patient will DC to: [facility] Anticipated DC date: 08/12/24  Family notified: Cannot reach family with number in chart, patient notified about discharge  Transport by: [facility]   Per MD patient ready for DC to [facility]. RN to call report prior to discharge [phone]; room 421B). RN, patient, patient's family, and facility notified of DC. Discharge Summary and FL2 sent to facility. DC packet on chart. Ambulance transport requested for patient 10:46AM.   CSW will sign off for now as social work intervention is no longer needed. Please consult us  again if new needs arise.  Final next level of care: Skilled Nursing Facility Barriers to Discharge: Barriers Resolved   Patient Goals and CMS Choice Patient states their goals for this hospitalization and ongoing recovery are:: To return to [facility] snf          Discharge Placement              Patient chooses bed at: [facility] Patient to be transferred to facility by: [facility] Name of family member notified: Cannot reach family at number listed in chart, patient notified about discharge Patient and family notified of of transfer: 08/12/24  Discharge Plan and Services Additional resources added to the After Visit Summary for   In-house Referral: Clinical Social Work                                   Social Drivers of Health (SDOH) Interventions SDOH Screenings   Food Insecurity: No Food Insecurity (08/10/2024)  Housing: Low Risk  (08/10/2024)  Transportation Needs: No Transportation Needs (08/10/2024)  Utilities: Not At Risk (08/10/2024)  Alcohol  Screen: Low Risk  (11/05/2022)  Depression (PHQ2-9): Low Risk  (06/09/2024)  Social Connections: Unknown  (02/21/2024)  Tobacco Use: Medium Risk (08/09/2024)     Readmission Risk Interventions    10/25/2023   10:46 AM 02/19/2023    1:52 PM 02/15/2023   11:15 AM  Readmission Risk Prevention Plan  Transportation Screening Complete Complete Complete  Medication Review Oceanographer) Complete Complete Complete  PCP or Specialist appointment within 3-5 days of discharge  Complete   HRI or Home Care Consult Complete Complete Complete  SW Recovery Care/Counseling Consult Complete Complete Complete  Palliative Care Screening Not Applicable Not Applicable Not Applicable  Skilled Nursing Facility Complete Complete Complete

## 2024-08-12 NOTE — Care Management Important Message (Signed)
 Important Message  Patient Details  Name: Shane Chambers MRN: [id number] Date of Birth: [birth year]   Important Message Given:  Yes - Medicare IM     Vonzell Arrie Sharps 08/12/2024, 11:46 AM

## 2024-08-12 NOTE — Evaluation (Signed)
 Occupational Therapy Evaluation Patient Details Name: Shane Chambers MRN: [id number] DOB: Nov 30, 1960 Today's Date: 08/12/2024   History of Present Illness   Pt is a 63 y.o. presenting to [facility] on 9/30 with RUQ pain and nausea. US  of gall bladder with findings of gall stones no evidence of cholecystitis. PMH: coronary artery disease status post CABG, COPD, depression, type II diabetes mellitus, GERD, hypertension, dyslipidemia and hypothyroidism as well as ischemic cardiomyopathy and peripheral vascular disease s/p right BKA.     Clinical Impressions Pt admitted based on above, and was seen based on problem list below. PTA pt was living at [facility], and reports mostly mod I with ADLs largely from a w/c level. Today pt is requiring set up  to min  assist for ADLs. Functional transfers are  CGA with use of RW. Pt primarily limited by decreased FMC, balance, and activity tolerance. Recommendation of <3 hours of skilled rehab daily. OT will continue to follow acutely to maximize functional independence.        If plan is discharge home, recommend the following:   A little help with walking and/or transfers;A little help with bathing/dressing/bathroom;Assistance with cooking/housework     Functional Status Assessment   Patient has had a recent decline in their functional status and demonstrates the ability to make significant improvements in function in a reasonable and predictable amount of time.     Equipment Recommendations   None recommended by OT      Precautions/Restrictions   Precautions Precautions: Fall Recall of Precautions/Restrictions: Intact Restrictions Weight Bearing Restrictions Per Provider Order: No     Mobility Bed Mobility Overal bed mobility: Modified Independent             General bed mobility comments: No assist from flat bed    Transfers Overall transfer level: Needs assistance Equipment used: Rolling walker (2 wheels) Transfers:  Sit to/from Stand, Bed to chair/wheelchair/BSC Sit to Stand: Contact guard assist     Step pivot transfers: Contact guard assist     General transfer comment: CGA for balance, decreased tolerance with limited steps      Balance Overall balance assessment: Needs assistance Sitting-balance support: Feet supported, Single extremity supported Sitting balance-Leahy Scale: Good     Standing balance support: Bilateral upper extremity supported, During functional activity, Reliant on assistive device for balance Standing balance-Leahy Scale: Poor Standing balance comment: External support for stability       ADL either performed or assessed with clinical judgement   ADL Overall ADL's : Needs assistance/impaired Eating/Feeding: Set up;Sitting   Grooming: Set up;Sitting Grooming Details (indicate cue type and reason): EOB         Upper Body Dressing : Set up;Sitting   Lower Body Dressing: Minimal assistance;Sit to/from stand Lower Body Dressing Details (indicate cue type and reason): Min assist for R sock, decreased Toilet Transfer: Contact guard assist;Rolling walker (2 wheels) Toilet Transfer Details (indicate cue type and reason): Short step pivot Toileting- Clothing Manipulation and Hygiene: Contact guard assist;Sit to/from stand       Functional mobility during ADLs: Contact guard assist;Rolling walker (2 wheels) General ADL Comments: FMC limiting, pt often using compensatory strategies and increased time     Vision Baseline Vision/History: 1 Wears glasses Vision Assessment?: No apparent visual deficits            Pertinent Vitals/Pain Pain Assessment Pain Assessment: No/denies pain Pain Intervention(s): Monitored during session     Extremity/Trunk Assessment Upper Extremity Assessment Upper Extremity Assessment: [name] deficits/detail  [name] Deficits / Details: PIP and DIP flexion contractures in all digits, unable to actively extend digits [name] Coordination:  decreased fine motor   Lower Extremity Assessment Lower Extremity Assessment: Defer to PT evaluation   Cervical / Trunk Assessment Cervical / Trunk Assessment: Kyphotic   Communication Communication Communication: No apparent difficulties   Cognition Arousal: Alert Behavior During Therapy: Flat affect Cognition: No apparent impairments     Following commands: Intact       Cueing  General Comments   Cueing Techniques: Verbal cues  VSS on 2.5 L O2           Home Living Family/patient expects to be discharged to:: Skilled nursing facility       Additional Comments: Lives at [facility]      Prior Functioning/Environment Prior Level of Function : Needs assist       Mobility Comments: Pt reporting primarily use of w/c and supervision with ambulation ADLs Comments: Assist with container management and bathing    OT Problem List: Decreased strength;Decreased range of motion;Decreased activity tolerance;Impaired balance (sitting and/or standing);Cardiopulmonary status limiting activity   OT Treatment/Interventions: Self-care/ADL training;Therapeutic exercise;Energy conservation;DME and/or AE instruction;Therapeutic activities;Patient/family education;Balance training      OT Goals(Current goals can be found in the care plan section)   Acute Rehab OT Goals Patient Stated Goal: To get better OT Goal Formulation: With patient Time For Goal Achievement: 08/26/24 Potential to Achieve Goals: Good   OT Frequency:  Min 2X/week       AM-PAC OT 6 Clicks Daily Activity     Outcome Measure Help from another person eating meals?: None Help from another person taking care of personal grooming?: A Little Help from another person toileting, which includes using toliet, bedpan, or urinal?: A Little Help from another person bathing (including washing, rinsing, drying)?: A Little Help from another person to put on and taking off regular upper body clothing?: A  Little Help from another person to put on and taking off regular lower body clothing?: A Little 6 Click Score: 19   End of Session Equipment Utilized During Treatment: Rolling walker (2 wheels) Nurse Communication: Mobility status  Activity Tolerance: Patient tolerated treatment well Patient left: in chair;with call bell/phone within reach  OT Visit Diagnosis: Unsteadiness on feet (R26.81);Other abnormalities of gait and mobility (R26.89);Muscle weakness (generalized) (M62.81)                Time: 9064-9040 OT Time Calculation (min): 24 min Charges:  OT General Charges $OT Visit: 1 Visit OT Evaluation $OT Eval Moderate Complexity: 1 Mod  Adrianne BROCKS, OT  Acute Rehabilitation Services Office [phone] Secure chat preferred   Adrianne GORMAN Savers 08/12/2024, 11:01 AM

## 2024-08-14 LAB — URINE CULTURE: Culture: >=100000 — AB

## 2024-08-24 ENCOUNTER — Other Ambulatory Visit (HOSPITAL_COMMUNITY): Payer: Self-pay

## 2024-08-25 ENCOUNTER — Ambulatory Visit: Admitting: Internal Medicine

## 2024-08-26 NOTE — Progress Notes (Unsigned)
 HPI: Shane Chambers is a 63 y.o. male who presents to the [facility] pharmacy clinic for their first Apretude  injection for HIV prevention.  Referring ID Provider: Dr. Fleeta Rothman   Patient Active Problem List   Diagnosis Date Noted   RUQ abdominal pain 08/10/2024   UTI (urinary tract infection) 08/10/2024   NSTEMI (non-ST elevated myocardial infarction) (HCC) 08/09/2024   Encounter for HIV pre-exposure prophylaxis 07/25/2024   Dysphagia 06/29/2024   Gastroesophageal reflux disease 06/29/2024   Allergy to cephalosporin 02/22/2024   Septic joint (HCC) 02/20/2024   Chronic retention of urine 02/09/2024   Neurogenic bladder 02/09/2024   Suprapubic catheter (HCC) 02/09/2024   Ambulatory dysfunction 02/09/2024   Medically complex patient 02/09/2024   Behavior concern in adult 12/16/2023   Anemia of chronic renal failure 12/08/2023   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   Type II diabetes mellitus with renal manifestations (HCC) 07/30/2023   Chronic diastolic CHF (congestive heart failure) (HCC) 07/30/2023   Dyslipidemia 03/27/2023   Hypothyroidism 03/27/2023   CKD (chronic kidney disease), stage IV (HCC) 03/27/2023   Moderate aortic stenosis 03/27/2023   Hyperkalemia 02/15/2023   Pulmonary edema 02/15/2023   Bilateral pleural effusion 02/15/2023   Nausea & vomiting 02/15/2023   Chronic diarrhea 02/15/2023   High anion gap metabolic acidosis 02/14/2023   S/P BKA (below knee amputation) unilateral, right (HCC) 12/17/2022   MRSA infection 11/19/2022   Amputation stump infection (HCC) 11/19/2022   QT prolongation 11/10/2022   Severe recurrent major depression without psychotic features (HCC) 11/05/2022   Major depressive disorder with psychotic features (HCC) 11/02/2022   Acute osteomyelitis (HCC) 10/02/2022   Subacute osteomyelitis, right ankle and foot (HCC) 10/02/2022   Hypoalbuminemia due to protein-calorie malnutrition 10/02/2022   Cellulitis of right lower extremity 09/25/2022   Morbid  obesity (HCC) 04/23/2022   Elevated troponin 04/22/2022   Cancer of sigmoid colon (HCC) 12/16/2021   Constipation 12/16/2021   GERD without esophagitis 12/16/2021   Iron  deficiency anemia 09/17/2021   Rectal cancer (HCC) 09/03/2021   Bilateral lower leg cellulitis 11/22/2020   Systolic and diastolic CHF, chronic (HCC) 11/22/2020   OSA (obstructive sleep apnea) 05/24/2020   COPD (chronic obstructive pulmonary disease) (HCC) 04/29/2020   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   ALBUTEROL  (VENTOLIN  HFA) 108 (90 BASE) MCG/ACT INHALER    Inhale 2 puffs into the lungs every 6 (six) hours as needed for wheezing or shortness of breath.   ASPIRIN  81 MG EC TABLET    Take 1 tablet (81 mg total) by mouth daily with breakfast.   ATORVASTATIN  (LIPITOR ) 40 MG TABLET    Take 1 tablet (40 mg total) by mouth at bedtime.   CARVEDILOL  (COREG ) 25 MG TABLET    Take 0.5 tablets (12.5 mg total) by mouth 2 (two) times daily with a meal.   CETIRIZINE (ZYRTEC) 10 MG TABLET    Take 10 mg by mouth  daily as needed for allergies.   COENZYME Q10 100 MG TABS    Take 100 mg by mouth daily.   DOCOSAHEXAENOIC ACID (DHA PO)    Take 1 capsule by mouth daily. Dosage not listed on MAR   FERROUS GLUCONATE  (FERGON) 324 MG TABLET    Take 324 mg by mouth daily.   FLUTICASONE  (FLONASE ) 50 MCG/ACT NASAL SPRAY    Place 1 spray into both nostrils daily.   FLUTICASONE -SALMETEROL (ADVAIR) 500-50  MCG/ACT AEPB    Inhale 1 puff into the lungs in the morning and at bedtime.   GABAPENTIN  (NEURONTIN ) 400 MG CAPSULE    Take 1 capsule (400 mg total) by mouth 2 (two) times daily.   HYDROCODONE -ACETAMINOPHEN  (NORCO/VICODIN) 5-325 MG TABLET    Take 1 tablet by mouth every 4 (four) hours as needed (pain).   INSULIN  ASPART (NOVOLOG  FLEXPEN) 100 UNIT/ML FLEXPEN    0-15 Units, Subcutaneous, 3 times daily with meals CBG < 70: implement hypoglycemia protocol-call MD CBG 70 - 120: 0 units CBG 121 - 150: 2 units CBG 151 - 200: 3 units CBG 201 - 250: 5 units CBG 251 - 300: 8 units CBG 301 - 350: 11 units CBG 351 - 400: 15 units CBG > 400:   IPRATROPIUM-ALBUTEROL  (DUONEB) 0.5-2.5 (3) MG/3ML SOLN    Take 3 mLs by nebulization every 6 (six) hours as needed (shortness of breath).   LACTULOSE  (CHRONULAC ) 10 GM/15ML SOLUTION    Take 15 mLs (10 g total) by mouth 2 (two) times daily.   LAMOTRIGINE (LAMICTAL) 100 MG TABLET    Take 100 mg by mouth 2 (two) times daily.   LEVOTHYROXINE  (SYNTHROID ) 100 MCG TABLET    Take 100 mcg by mouth daily.   LUBIPROSTONE  (AMITIZA ) 24 MCG CAPSULE    Take 1 capsule (24 mcg total) by mouth 2 (two) times daily with a meal.   METOLAZONE  (ZAROXOLYN ) 2.5 MG TABLET    Take 1 tablet (2.5 mg total) by mouth once a week. On Wednesdays   MULTIPLE VITAMIN (MULTIVITAMIN) TABLET    Take 1 tablet by mouth daily.   MUPIROCIN  OINTMENT (BACTROBAN ) 2 %    Place 1 Application into the nose 2 (two) times daily.   NUTRITIONAL SUPPLEMENTS (FEEDING SUPPLEMENT, NEPRO CARB STEADY,) LIQD    Take 120 mLs by mouth 2 (two) times daily between meals.   OXYGEN    Inhale 2 L/min into the lungs continuous.   PANTOPRAZOLE  (PROTONIX ) 40 MG TABLET    Take 1 tablet (40 mg total) by mouth daily. Before breakfast   POLYETHYLENE GLYCOL (MIRALAX  / GLYCOLAX ) 17 G PACKET    Take 17 g by mouth 2 (two) times daily.   POLYVINYL ALCOHOL  (LIQUIFILM TEARS) 1.4 % OPHTHALMIC SOLUTION    Place 1 drop into both eyes daily.    SENNA-DOCUSATE (SENOKOT-S) 8.6-50 MG TABLET    Take 2 tablets by mouth at bedtime.   SODIUM BICARBONATE  325 MG TABLET    Take 325 mg by mouth 2 (two) times daily.   TAMSULOSIN  (FLOMAX ) 0.4 MG CAPS CAPSULE    Take 0.8 mg by mouth every evening.   THIAMINE  (VITAMIN B-1) 100 MG TABLET    Take 1 tablet (100 mg total) by mouth daily.   TORSEMIDE  (DEMADEX ) 20 MG TABLET    Take 1 tablet (20 mg total) by mouth 2 (two) times daily.   TRAZODONE  (DESYREL ) 100 MG TABLET    Take 1 tablet (100 mg total) by mouth at bedtime.   VITAMIN  D, ERGOCALCIFEROL , (DRISDOL ) 1.25 MG (50000 UNIT) CAPS CAPSULE    Take 50,000 Units by mouth every 7 (seven) days. Mondays  Modified Medications   No medications on file  Discontinued Medications   No medications on file    Allergies: Allergies  Allergen Reactions   Ceftriaxone  Anaphylaxis    (Rocephin ) Previously tolerated at [facility] during multiple admissions. Patient reports SOB reaction in [place], Summer 2024. Now reporting SOB reaction at [facility] 02/21/24. No swelling, or skin reaction. Satted well on baseline oxygen. Does not wish to receive Ceftriaxone  in the future.   Ace Inhibitors Swelling and Cough   Chlorhexidine     Haloperidol  And Related     Do NOT give anti-psychotics due to risk of  Torsades and QT prolongation (per Dr. Trude request)   Other Itching    [name]   Pork Allergy Other (See Comments)    Not listed in [facility]   Shellfish Allergy     Listed on MAR   Penicillins Itching and Rash    Tolerated ancef  (12-17-22)    Labs: No results found for: HIV1RNAQUANT, HIV1RNAVL, CD4TABS  RPR and STI Lab Results  Component Value Date   LABRPR NON-REACTIVE 05/25/2024        No data to display          Hepatitis B Lab Results  Component Value Date   HEPBSAG NON-REACTIVE 07/27/2024   Hepatitis C Lab Results  Component Value Date   HEPCAB NON-REACTIVE 07/27/2024   Hepatitis A Lab Results  Component Value Date   HAV  REACTIVE (A) 07/27/2024   Lipids: Lab Results  Component Value Date   CHOL 87 08/11/2024   TRIG 74 08/11/2024   HDL 27 (L) 08/11/2024   CHOLHDL 3.2 08/11/2024   VLDL 15 08/11/2024   LDLCALC 45 08/11/2024    Current PrEP Regimen: None  Target Date: Today - the 20th  Assessment: Shane Chambers presents today for their first initiation injection of Apretude  and to follow up for HIV PrEP.  Counseled that Apretude  is one intramuscular injection in the gluteal muscle for each visit. Explained that the second injection is 30 days after the initial injection then every 2 months thereafter. Discussed follow up appointments moving forward. Screened for acute HIV symptoms such as fatigue, muscle aches, rash, sore throat, lymphadenopathy, headache, night sweats, nausea/vomiting/diarrhea, and fever. Denies any symptoms. Already received annual influenza vaccine and will get updated COVID vaccine at his care facility.  Explained that showing up to injection appointments is very important and warned that if appointments are missed, protection will be minimal and the risk of acquiring HIV becomes much higher. Counseled on possible side effects associated with the injections such as injection site pain, which is usually mild to moderate in nature, injection site nodules, and injection site reactions. Asked to call the clinic or send me a mychart message if they experience any issues. Advised that he can take Motrin or Tylenol  for injection site pain if needed. He may also pre-treat with Motrin or Tylenol  30-45 minutes before scheduled appointments.   Administered cabotegravir  600mg /83mL in left upper outer quadrant of the gluteal muscle. Monitored patient for 10 minutes after injection. Injection was tolerated well without issue. Counseled to call with any issues that may arise. Will make follow up appointments for second initiation injection in 30 days and then maintenance injections every 2 months thereafter.     Plan: - First Apretude  injection administered - HIV ab today - Second initiation injection scheduled  for 09/22/24 with Alan - Maintenance injections scheduled for 11/28/24 with Dr. Fleeta Rothman and 01/20/25 with Alan - Call with any issues or questions  Joshoa Shawler L. Ivet Guerrieri, PharmD, BCIDP, AAHIVP, CPP Clinical Pharmacist Practitioner - Infectious Diseases Clinical Pharmacist Lead - [facility]

## 2024-08-29 ENCOUNTER — Other Ambulatory Visit: Payer: Self-pay

## 2024-08-29 ENCOUNTER — Other Ambulatory Visit (HOSPITAL_COMMUNITY): Payer: Self-pay

## 2024-08-29 ENCOUNTER — Ambulatory Visit (INDEPENDENT_AMBULATORY_CARE_PROVIDER_SITE_OTHER): Payer: Self-pay | Admitting: Pharmacist

## 2024-08-29 DIAGNOSIS — Z2981 Encounter for HIV pre-exposure prophylaxis: Secondary | ICD-10-CM

## 2024-08-29 MED ORDER — CABOTEGRAVIR ER 600 MG/3ML IM SUER
600.0000 mg | Freq: Once | INTRAMUSCULAR | Status: AC
  Administered 2024-08-29: 600 mg via INTRAMUSCULAR

## 2024-08-30 LAB — HIV ANTIBODY (ROUTINE TESTING W REFLEX)
HIV 1&2 Ab, 4th Generation: NONREACTIVE
HIV FINAL INTERPRETATION: NEGATIVE

## 2024-09-05 ENCOUNTER — Ambulatory Visit (INDEPENDENT_AMBULATORY_CARE_PROVIDER_SITE_OTHER)

## 2024-09-05 ENCOUNTER — Other Ambulatory Visit: Payer: Self-pay | Admitting: Medical Genetics

## 2024-09-05 DIAGNOSIS — R339 Retention of urine, unspecified: Secondary | ICD-10-CM | POA: Diagnosis not present

## 2024-09-05 DIAGNOSIS — Z006 Encounter for examination for normal comparison and control in clinical research program: Secondary | ICD-10-CM

## 2024-09-05 DIAGNOSIS — N319 Neuromuscular dysfunction of bladder, unspecified: Secondary | ICD-10-CM

## 2024-09-05 DIAGNOSIS — Z9359 Other cystostomy status: Secondary | ICD-10-CM

## 2024-09-05 MED ORDER — CIPROFLOXACIN HCL 500 MG PO TABS
500.0000 mg | ORAL_TABLET | Freq: Once | ORAL | Status: AC
  Administered 2024-09-05: 500 mg via ORAL

## 2024-09-05 MED ORDER — BACITRACIN 500 UNIT/GM EX OINT: 1.0000 | TOPICAL_OINTMENT | Freq: Two times a day (BID) | CUTANEOUS | 0 refills | Status: AC

## 2024-09-05 NOTE — Progress Notes (Cosign Needed Addendum)
 Suprapubic Cath Change  Patient is present today for a suprapubic catheter change due to urinary retention.  10 ml of water  was drained from the balloon, a 20 FR foley cath was removed from the tract with out difficulty.  Suprapubic catheter site was cleaned and prepped in a sterile fashion with [id number]  A 20 FR foley cath was replaced into the tract no complications were noted. Flush  return was noted, urine Clear yellow in color . 10 ml of sterile water  was inflated into the balloon and a night bag was attached for drainage.  Patient tolerated well. A night bag was given to patient and proper instruction was given on how to switch bags.    Performed by: Carlos, CMA  Follow up: 4 weeks SP tube cath change

## 2024-09-07 ENCOUNTER — Other Ambulatory Visit (HOSPITAL_COMMUNITY): Payer: Self-pay

## 2024-09-09 ENCOUNTER — Ambulatory Visit: Attending: Internal Medicine | Admitting: Internal Medicine

## 2024-09-09 ENCOUNTER — Other Ambulatory Visit: Payer: Self-pay | Admitting: Pharmacist

## 2024-09-09 ENCOUNTER — Ambulatory Visit: Attending: Internal Medicine

## 2024-09-09 ENCOUNTER — Other Ambulatory Visit (HOSPITAL_COMMUNITY): Payer: Self-pay

## 2024-09-09 ENCOUNTER — Encounter: Payer: Self-pay | Admitting: Internal Medicine

## 2024-09-09 ENCOUNTER — Other Ambulatory Visit: Payer: Self-pay

## 2024-09-09 VITALS — BP 152/90 | HR 73 | Ht 70.0 in | Wt 246.0 lb

## 2024-09-09 DIAGNOSIS — T50905A Adverse effect of unspecified drugs, medicaments and biological substances, initial encounter: Secondary | ICD-10-CM | POA: Diagnosis not present

## 2024-09-09 DIAGNOSIS — R002 Palpitations: Secondary | ICD-10-CM

## 2024-09-09 DIAGNOSIS — Z2981 Encounter for HIV pre-exposure prophylaxis: Secondary | ICD-10-CM

## 2024-09-09 DIAGNOSIS — I4721 Torsades de pointes: Secondary | ICD-10-CM

## 2024-09-09 MED ORDER — APRETUDE 600 MG/3ML IM SUER
600.0000 mg | INTRAMUSCULAR | 0 refills | Status: DC
  Filled 2024-09-09: qty 3, 30d supply, fill #0

## 2024-09-09 NOTE — Progress Notes (Signed)
 HPI [name] returns for followup. He has a h/o CAD, s/p MI, s/p CABG, chronic systolic heart failure, PVD s/p right BKA, and drug induced long QT and torsades. He has not had any more arrhythmias or syncope since his meds were adjusted and he was placed on a beta blocker. He notes occaisional episodes of dizziness and palpitations.  Allergies  Allergen Reactions   Ceftriaxone  Anaphylaxis    (Rocephin ) Previously tolerated at [facility] during multiple admissions. Patient reports SOB reaction in [place], Summer 2024. Now reporting SOB reaction at [facility] 02/21/24. No swelling, or skin reaction. Satted well on baseline oxygen. Does not wish to receive Ceftriaxone  in the future.   Ace Inhibitors Swelling and Cough   Chlorhexidine     Haloperidol  And Related     Do NOT give anti-psychotics due to risk of  Torsades and QT prolongation (per Dr. Trude request)   Other Itching    [name]   Pork Allergy Other (See Comments)    Not listed in [facility]   Shellfish Allergy     Listed on MAR   Penicillins Itching and Rash    Tolerated ancef  (12-17-22)     Current Outpatient Medications  Medication Sig Dispense Refill   acetaminophen  (TYLENOL ) 500 MG tablet Take 500 mg by mouth every 8 (eight) hours as needed for moderate pain (pain score 4-6).     albuterol  (VENTOLIN  HFA) 108 (90 Base) MCG/ACT inhaler Inhale 2 puffs into the lungs every 6 (six) hours as needed for wheezing or shortness of breath.     aspirin  81 MG EC tablet Take 1 tablet (81 mg total) by mouth daily with breakfast. 30 tablet 12   atorvastatin  (LIPITOR ) 40 MG tablet Take 1 tablet (40 mg total) by mouth at bedtime.     AUVELITY  45-105 MG TBCR Take 1 tablet by mouth daily.     cetirizine (ZYRTEC) 10 MG tablet Take 10 mg by mouth daily as needed for allergies.     Coenzyme Q10 100 MG TABS Take 100 mg by mouth daily.     ferrous gluconate  (FERGON) 324 MG tablet Take 324 mg by mouth daily.     fluticasone  (FLONASE ) 50  MCG/ACT nasal spray Place 1 spray into both nostrils daily.     fluticasone -salmeterol (ADVAIR) 500-50 MCG/ACT AEPB Inhale 1 puff into the lungs in the morning and at bedtime.     gabapentin  (NEURONTIN ) 400 MG capsule Take 1 capsule (400 mg total) by mouth 2 (two) times daily.     HYDROcodone -acetaminophen  (NORCO/VICODIN) 5-325 MG tablet Take 1 tablet by mouth every 4 (four) hours as needed (pain).     ipratropium-albuterol  (DUONEB) 0.5-2.5 (3) MG/3ML SOLN Take 3 mLs by nebulization every 6 (six) hours as needed (shortness of breath).     lactulose  (CHRONULAC ) 10 GM/15ML solution Take 15 mLs (10 g total) by mouth 2 (two) times daily. 473 mL 0   lamoTRIgine (LAMICTAL) 100 MG tablet Take 100 mg by mouth 2 (two) times daily.     levothyroxine  (SYNTHROID ) 100 MCG tablet Take 100 mcg by mouth daily.     lubiprostone  (AMITIZA ) 24 MCG capsule Take 1 capsule (24 mcg total) by mouth 2 (two) times daily with a meal. 60 capsule 3   metolazone  (ZAROXOLYN ) 2.5 MG tablet Take 1 tablet (2.5 mg total) by mouth once a week. On Wednesdays     Multiple Vitamin (MULTIVITAMIN) tablet Take 1 tablet by mouth daily.     mupirocin  ointment (  BACTROBAN ) 2 % Place 1 Application into the nose 2 (two) times daily. 22 g 0   Nutritional Supplements (FEEDING SUPPLEMENT, NEPRO CARB STEADY,) LIQD Take 120 mLs by mouth 2 (two) times daily between meals.     OXYGEN Inhale 2 L/min into the lungs continuous.     pantoprazole  (PROTONIX ) 40 MG tablet Take 1 tablet (40 mg total) by mouth daily. Before breakfast 90 tablet 3   polyethylene glycol (MIRALAX  / GLYCOLAX ) 17 g packet Take 17 g by mouth 2 (two) times daily. 14 each 0   polyvinyl alcohol  (LIQUIFILM TEARS) 1.4 % ophthalmic solution Place 1 drop into both eyes daily.     senna-docusate (SENOKOT-S) 8.6-50 MG tablet Take 2 tablets by mouth at bedtime.     sodium bicarbonate  325 MG tablet Take 325 mg by mouth 2 (two) times daily.     tamsulosin  (FLOMAX ) 0.4 MG CAPS capsule Take 0.8 mg  by mouth every evening.     thiamine  (VITAMIN B-1) 100 MG tablet Take 1 tablet (100 mg total) by mouth daily.     torsemide  (DEMADEX ) 20 MG tablet Take 1 tablet (20 mg total) by mouth 2 (two) times daily.     traZODone  (DESYREL ) 100 MG tablet Take 1 tablet (100 mg total) by mouth at bedtime.     Vitamin D , Ergocalciferol , (DRISDOL ) 1.25 MG (50000 UNIT) CAPS capsule Take 50,000 Units by mouth every 7 (seven) days. Mondays     bacitracin  500 UNIT/GM ointment Apply 1 Application topically 2 (two) times daily. 15 g 0   carvedilol  (COREG ) 25 MG tablet Take 0.5 tablets (12.5 mg total) by mouth 2 (two) times daily with a meal.     Docosahexaenoic Acid (DHA PO) Take 1 capsule by mouth daily. Dosage not listed on MAR     insulin  aspart (NOVOLOG  FLEXPEN) 100 UNIT/ML FlexPen 0-15 Units, Subcutaneous, 3 times daily with meals CBG < 70: implement hypoglycemia protocol-call MD CBG 70 - 120: 0 units CBG 121 - 150: 2 units CBG 151 - 200: 3 units CBG 201 - 250: 5 units CBG 251 - 300: 8 units CBG 301 - 350: 11 units CBG 351 - 400: 15 units CBG > 400: (Patient taking differently: Inject 5 Units into the skin 3 (three) times daily with meals. Hold for BG < 150, notify provider) 15 mL 0   No current facility-administered medications for this visit.     Past Medical History:  Diagnosis Date   Acute on chronic combined systolic and diastolic CHF (congestive heart failure) (HCC) 03/05/2020   Acute on chronic heart failure with preserved ejection fraction (HFpEF) (HCC) 03/27/2023   Anemia    Arthritis    CAD (coronary artery disease)    a. s/p CABG in 03/2020 with LIMA-LAD, RIMA-PL, RA-D1-RI-OM1   Cancer (HCC)    rectal   Cellulitis    COPD (chronic obstructive pulmonary disease) (HCC)    Depression    Diabetes mellitus without complication (HCC)    Encounter for HIV pre-exposure prophylaxis 07/25/2024   GERD (gastroesophageal reflux disease)    History of kidney stones    Hyperlipidemia 12/01/2019    Hypertension    Hypothyroidism    Ischemic cardiomyopathy    a. EF 20-25% by echo in 02/2020 b. at 40% by echo in 03/2020 c. EF normalized to 60-65% by echo in 04/2020   Myocardial infarction [facility])    Peripheral vascular disease    Sleep apnea    Type 2 diabetes mellitus (HCC)  ROS:   All systems reviewed and negative except as noted in the HPI.   Past Surgical History:  Procedure Laterality Date   AMPUTATION Right 10/05/2022   Procedure: AMPUTATION BELOW KNEE;  Surgeon: Harden Jerona GAILS, MD;  Location: [facility] OR;  Service: Orthopedics;  Laterality: Right;   AMPUTATION Right 11/14/2022   Procedure: AMPUTATION BELOW KNEE REVISION; WASHOUT;  Surgeon: Jama Cordella MATSU, MD;  Location: [facility] ORS;  Service: Vascular;  Laterality: Right;   AMPUTATION Right 11/18/2022   Procedure: AMPUTATION BELOW KNEE REVISION AND CLOSURE;  Surgeon: Jama Cordella MATSU, MD;  Location: [facility] ORS;  Service: Vascular;  Laterality: Right;   APPLICATION OF WOUND VAC Right 10/05/2022   Procedure: APPLICATION OF WOUND VAC;  Surgeon: Harden Jerona GAILS, MD;  Location: [facility] OR;  Service: Orthopedics;  Laterality: Right;   BIOPSY  07/18/2021   Procedure: BIOPSY;  Surgeon: [name], DO;  [facility];  Service: Endoscopy;;   BIOPSY  01/09/2022   Procedure: BIOPSY;  Surgeon: [name], DO;  [facility];  Service: Endoscopy;;   BIOPSY  06/29/2023   Procedure: BIOPSY;  Surgeon: [name], DO;  [facility];  Service: Endoscopy;;   COLONOSCOPY WITH PROPOFOL  N/A 07/18/2021   Carver: 15 millimeter polyp removed from the sigmoid colon, 5 mm polyp removed from sigmoid colon.  Nonbleeding internal hemorrhoids.  Significant looping of the colon. sigmoid path showed invasive colonic adenocarcinoma involving tubular adenoma (invades to depth of 2mm, carcinoma 1mm from margin, no lymphovascular invasion, no poorly differentiated component.   COLONOSCOPY WITH PROPOFOL  N/A 06/29/2023    Procedure: COLONOSCOPY WITH PROPOFOL ;  Surgeon: [name], DO;  [facility];  Service: Endoscopy;  Laterality: N/A;  7:30 am, asa 3   CORONARY ARTERY BYPASS GRAFT N/A 03/13/2020   Procedure: CORONARY ARTERY BYPASS GRAFTING (CABG) times five using bilateral Internal mammary arteries and left radial artery;  Surgeon: German Bartlett PEDLAR, MD;  Location: [facility] OR;  Service: Open Heart Surgery;  Laterality: N/A;   DENTAL SURGERY     ESOPHAGEAL DILATION N/A 07/22/2024   Procedure: DILATION, ESOPHAGUS;  Surgeon: [name], DO;  [facility];  Service: Endoscopy;  Laterality: N/A;   ESOPHAGOGASTRODUODENOSCOPY N/A 07/22/2024   Procedure: EGD (ESOPHAGOGASTRODUODENOSCOPY);  Surgeon: [name], DO;  [facility];  Service: Endoscopy;  Laterality: N/A;  9:00 am, asa 3   ESOPHAGOGASTRODUODENOSCOPY (EGD) WITH PROPOFOL  N/A 07/18/2021   Carver: 1 gastric polyp status post biopsy, gastritis. gastric bx with slight chronic inflammation and no H.pyori. GEJ polypectomy with mild inflammation only   FLEXIBLE SIGMOIDOSCOPY N/A 08/26/2021   Carver: Nonbleeding internal hemorrhoids.  15 mm ulcers from previous polypectomy found in the rectum.  No evidence of previous polyp.  Located 5 to 8 cm from anal verge.   FLEXIBLE SIGMOIDOSCOPY N/A 01/09/2022   Procedure: FLEXIBLE SIGMOIDOSCOPY;  Surgeon: [name], DO;  [facility];  Service: Endoscopy;  Laterality: N/A;   IR CYSTOSTOMY TUBE PLACEMENT/BLADDER ASPIRATION  03/11/2024   IRRIGATION AND DEBRIDEMENT ELBOW Left 02/20/2024   Procedure: LEFT ELBOW IRRIGATION AND DEBRIDEMENT;  Surgeon: Germaine Redbird, MD;  Location: [facility] OR;  Service: Orthopedics;  Laterality: Left;   POLYPECTOMY  07/18/2021   Procedure: POLYPECTOMY INTESTINAL;  Surgeon: [name], DO;  [facility];  Service: Endoscopy;;   POLYPECTOMY  06/29/2023   Procedure: POLYPECTOMY INTESTINAL;  Surgeon: [name], DO;  [facility];  Service: Endoscopy;;   RADIAL ARTERY HARVEST Left 03/13/2020   Procedure: RADIAL ARTERY HARVEST,;  Surgeon: German Bartlett PEDLAR, MD;  Location: [facility] OR;  Service: Open Heart Surgery;  Laterality: Left;   RIGHT/LEFT HEART CATH AND CORONARY ANGIOGRAPHY N/A 03/07/2020   Procedure: RIGHT/LEFT HEART CATH AND CORONARY ANGIOGRAPHY;  Surgeon: Wonda Sharper, MD;  Location: [facility] INVASIVE CV LAB;  Service: Cardiovascular;  Laterality: N/A;   STUMP REVISION Right 12/17/2022   Procedure: RIGHT BELOW KNEE AMPUTATION REVISION;  Surgeon: Harden Jerona GAILS, MD;  Location: [facility] OR;  Service: Orthopedics;  Laterality: Right;   SUBMUCOSAL LIFTING INJECTION  01/09/2022   Procedure: SUBMUCOSAL LIFTING INJECTION;  Surgeon: [name], DO;  [facility];  Service: Endoscopy;;   SUBMUCOSAL TATTOO INJECTION  01/09/2022   Procedure: SUBMUCOSAL TATTOO INJECTION;  Surgeon: [name], DO;  [facility];  Service: Endoscopy;;   TEE WITHOUT CARDIOVERSION N/A 03/13/2020   Procedure: TRANSESOPHAGEAL ECHOCARDIOGRAM (TEE);  Surgeon: German Bartlett PEDLAR, MD;  Location[facility] OR;  Service: Open Heart Surgery;  Laterality: N/A;     Family History  Problem Relation Age of Onset   Hypertension Mother      Social History   Socioeconomic History   Marital status: Single    Spouse name: Not on file   Number of children: Not on file   Years of education: Not on file   Highest education level: Not on file  Occupational History   Not on file  Tobacco Use   Smoking status: Former    Current packs/day: 0.00    Types: Cigarettes    Quit date: 05/25/1995    Years since quitting: 29.3   Smokeless tobacco: Never  Vaping Use   Vaping status: Never Used  Substance and Sexual Activity   Alcohol  use: Not Currently    Comment: rarely   Drug use: No   Sexual activity: Not Currently  Other Topics Concern   Not on file  Social History Narrative   Not on file   Social Drivers of Health    Financial Resource Strain: Not on file  Food Insecurity: No Food Insecurity (08/10/2024)   Hunger Vital Sign    Worried About Running Out of Food in the Last Year: Never true    Ran Out of Food in the Last Year: Never true  Transportation Needs: No Transportation Needs (08/10/2024)   PRAPARE - Administrator, Civil Service (Medical): No    Lack of Transportation (Non-Medical): No  Physical Activity: Not on file  Stress: Not on file  Social Connections: Unknown (02/21/2024)   Social Connection and Isolation Panel    Frequency of Communication with Friends and Family: Not on file    Frequency of Social Gatherings with Friends and Family: Not on file    Attends Religious Services: Not on file    Active Member of Clubs or Organizations: Not on file    Attends Banker Meetings: Not on file    Marital Status: Never married  Intimate Partner Violence: Not At Risk (08/10/2024)   Humiliation, Afraid, Rape, and Kick questionnaire    Fear of Current or Ex-Partner: No    Emotionally Abused: No    Physically Abused: No    Sexually Abused: No     BP (!) 152/90 Comment: has not taken morning medications yet  Pulse 73   Ht 5' 10 (1.778 m)   Wt 246 lb (111.6 kg)   SpO2 98%  BMI 35.30 kg/m   Physical Exam:  Well appearing NAD HEENT: Unremarkable Neck:  No JVD, no thyromegally Lymphatics:  No adenopathy Back:  No CVA tenderness Lungs:  Clear with no wheezes HEART:  Regular rate rhythm, no murmurs, no rubs, no clicks Abd:  soft, positive bowel sounds, no organomegally, no rebound, no guarding Ext:  2 plus pulses, no edema, no cyanosis, no clubbing Skin:  No rashes no nodules Neuro:  CN II through XII intact, motor grossly intact  DEVICE  Normal device function.  See PaceArt for details.   Assess/Plan:  Prolonged QT - I encouraged him to avoid QT prolonging drugs and continue his coreg . No additional recs at this time. With the palpitations and near  syncope, I will have him wear a 7 day zio.  CAD - he denies anginal symptoms but admits to being sedentary. We will follow. HTN - his bp is controlled. Continue. Peripheral vascular s/p right bka - he is ambulating with a walker. We will follow.   Danelle Rayleen Wyrick,MD

## 2024-09-09 NOTE — Addendum Note (Signed)
 Addended by: Pia Jedlicka G on: 09/09/2024 03:26 PM   Modules accepted: Orders

## 2024-09-09 NOTE — Patient Instructions (Signed)
 Medication Instructions:  Your physician recommends that you continue on your current medications as directed. Please refer to the Current Medication list given to you today.  *If you need a refill on your cardiac medications before your next appointment, please call your pharmacy*  Lab Work: NONE  If you have labs (blood work) drawn today and your tests are completely normal, you will receive your results only by: MyChart Message (if you have MyChart) OR A paper copy in the mail If you have any lab test that is abnormal or we need to change your treatment, we will call you to review the results.  Testing/Procedures: ZIO XT- Long Term Monitor Instructions   Your physician has requested you wear your ZIO patch monitor___7____days.   This is a single patch monitor.  [employer] supplies one patch monitor per enrollment.  Additional stickers are not available.   Apply patch as indicated in monitor instructions.  Patch will be place under collarbone on left side of chest with arrow pointing upward.     Do not shower for the first 24 hours.  You may shower after the first 24 hours.   Press button if you feel a symptom. You will hear a small click.  Record Date, Time and Symptom in the Patient Log Book.   When you are ready to remove patch, follow instructions on last 2 pages of Patient Log Book.  Stick patch monitor onto last page of Patient Log Book.   Place Patient Log Book in [place] box.  Use locking tab on box and tape box closed securely.  The [employer] and [employer] has [employer] on it.  Please place in mailbox as soon as possible.  Your physician should have your test results approximately 7 days after the monitor has been mailed back to [facility].   Call [facility] Customer Care at [phone] if you have questions regarding your ZIO XT patch monitor.  Call them immediately if you see an orange light blinking on your monitor.   If your monitor falls off in less than 4 days  contact our Monitor department at [phone].  If your monitor becomes loose or falls off after 4 days call [employer] at [phone] for suggestions on securing your monitor.    Follow-Up: At [facility], you and your health needs are our priority.  As part of our continuing mission to provide you with exceptional heart care, our providers are all part of one team.  This team includes your primary Cardiologist (physician) and Advanced Practice Providers or APPs (Physician Assistants and Nurse Practitioners) who all work together to provide you with the care you need, when you need it.  Your next appointment:   1 year(s)  Provider:   Donnice Primus, MD    We recommend signing up for the patient portal called MyChart.  Sign up information is provided on this After Visit Summary.  MyChart is used to connect with patients for Virtual Visits (Telemedicine).  Patients are able to view lab/test results, encounter notes, upcoming appointments, etc.  Non-urgent messages can be sent to your provider as well.   To learn more about what you can do with MyChart, go to [email or web address].   Other Instructions Thank you for choosing [facility]!

## 2024-09-09 NOTE — Progress Notes (Signed)
 Specialty Pharmacy Refill Coordination Note  Shane Chambers is a 63 y.o. male assessed today regarding refills of clinic administered specialty medication(s) Cabotegravir  (Apretude )   Clinic requested Courier to Provider Office   Delivery date: 09/19/24   Verified address: [address] [place] [address]   Medication will be filled on 09/16/24.

## 2024-09-12 ENCOUNTER — Other Ambulatory Visit (HOSPITAL_COMMUNITY): Payer: Self-pay

## 2024-09-14 ENCOUNTER — Encounter: Payer: Self-pay | Admitting: *Deleted

## 2024-09-14 ENCOUNTER — Encounter: Payer: Self-pay | Admitting: Gastroenterology

## 2024-09-14 ENCOUNTER — Ambulatory Visit: Admitting: Gastroenterology

## 2024-09-14 VITALS — BP 152/67 | HR 67 | Temp 98.2°F | Ht 70.0 in | Wt 247.7 lb

## 2024-09-14 DIAGNOSIS — K219 Gastro-esophageal reflux disease without esophagitis: Secondary | ICD-10-CM

## 2024-09-14 DIAGNOSIS — R1310 Dysphagia, unspecified: Secondary | ICD-10-CM | POA: Diagnosis not present

## 2024-09-14 DIAGNOSIS — K5909 Other constipation: Secondary | ICD-10-CM | POA: Diagnosis not present

## 2024-09-14 MED ORDER — NYSTATIN 100000 UNIT/ML MT SUSP: 5.0000 mL | Freq: Four times a day (QID) | OROMUCOSAL | 0 refills | Status: AC

## 2024-09-14 MED ORDER — NALOXEGOL OXALATE 12.5 MG PO TABS: 12.5000 mg | ORAL_TABLET | Freq: Every day | ORAL | 3 refills | Status: AC

## 2024-09-14 NOTE — Patient Instructions (Signed)
 We are arranging an xray of your esophagus.  I would like for you to stop Amitiza  and stool softeners. Instead, let's try Movantik once each morning, 1 hour before eating.   Please message me on mychart with how this works for you! We may add in fiber to help with stool consistency.  I have sent in Nystatin to swish and swallow 4 times a day for 2 weeks.   We will see you in 3 months or sooner if needed!  I enjoyed seeing you again today! I value our relationship and want to provide genuine, compassionate, and quality care. You may receive a survey regarding your visit with me, and I welcome your feedback! Thanks so much for taking the time to complete this. I look forward to seeing you again.      Therisa MICAEL Stager, PhD, ANP-BC [facility]

## 2024-09-14 NOTE — Progress Notes (Signed)
 Gastroenterology Office Note     Primary Care Physician:  System, Provider Not In  Primary Gastroenterologist: Dr Cindie   Chief Complaint   Chief Complaint  Patient presents with   Follow-up    Pt arrives for follow up. Pt states doing ok on Pantoprazole . Still having same issues as before. EGD showed esophagus was ok     History of Present Illness   Shane Chambers is a 63 y.o. male presenting today with a history of rectal cancer diagnosed at initial screening colonoscopy in Sept 2022 without need for surgery or chemotherapy, followed by Oncology, normocytic anemia in setting of CKD and relative IDA, last seen in Aug 2025 with dysphagia and constipation concerns.   In interim from last visit was in [facility] 08/12/2024 with RUQ abdominal pain and had elevated troponins, no chest pain or ischemic changes on EKG, cardiology consulted and felt elevated troponin was related to CKD and did not recommend any further ischemic work-up. HIDA scan normal in hospital. , no gallstones on US . Felt constipation was driving symptoms.   In the past: Amitiza  8 mcg BID, then increased to Amitiza  24 mcg at once daily dosing but wouldn't take if had to go somewhere as didn't want to have urgency. Linzess  caused diarrhea. Now Amitiza  is BID 24 mcg but hasn't taken in several days as was having more looser bowel movements a few days ago, didn't take today. Doesn't usually eat breakfast. Orders out for dinner. Hydrocodone  daily. Stools are mushy to runny. If doesn't take medication, will not have a BM.  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 KOH prep positive for yeast.   No odynophagia. Still feels like with chewing very well, will still have pieces hanging up and will have to drink liquids to get it down. No pain. Occasional nausea, no vomiting.   Aug 2024 with non-bleeding internal hemorrhoids, post-polypectomy rectal scar s/p biopsy and tattoo seen in rectum. One 7 mm cecal polyp. Tubular adenoma, negative recurrence on path. 3 year surveillance due.   EGD 2022: 1 gastric polyp s/p biopsy, gastritis. Gastritis and no H.pylori.   Past Medical History:  Diagnosis Date   Acute on chronic combined systolic and diastolic CHF (congestive heart failure) (HCC) 03/05/2020   Acute on chronic heart failure with preserved ejection fraction (HFpEF) (HCC) 03/27/2023   Anemia    Arthritis    CAD (coronary artery disease)    a. s/p CABG in 03/2020 with LIMA-LAD, RIMA-PL, RA-D1-RI-OM1   Cancer (HCC)    rectal   Cellulitis    COPD (chronic obstructive pulmonary disease) (HCC)    Depression    Diabetes mellitus without complication (HCC)    Encounter for HIV pre-exposure prophylaxis 07/25/2024   GERD (gastroesophageal reflux disease)    History of kidney stones    Hyperlipidemia 12/01/2019   Hypertension    Hypothyroidism    Ischemic cardiomyopathy    a. EF 20-25% by echo in 02/2020 b. at 40% by echo in 03/2020 c. EF normalized to 60-65% by echo in 04/2020   Myocardial infarction [facility])    Peripheral vascular disease    Sleep apnea    Type 2 diabetes  mellitus [facility])     Past Surgical History:  Procedure Laterality Date   AMPUTATION Right 10/05/2022   Procedure: AMPUTATION BELOW KNEE;  Surgeon: Harden Jerona GAILS, MD;  Location: [facility] OR;  Service: Orthopedics;  Laterality: Right;   AMPUTATION Right 11/14/2022   Procedure: AMPUTATION BELOW KNEE REVISION; WASHOUT;  Surgeon: Jama Cordella MATSU, MD;  Location: [facility] ORS;  Service: Vascular;  Laterality: Right;   AMPUTATION Right 11/18/2022   Procedure: AMPUTATION BELOW KNEE  REVISION AND CLOSURE;  Surgeon: Jama Cordella MATSU, MD;  Location: [facility] ORS;  Service: Vascular;  Laterality: Right;   APPLICATION OF WOUND VAC Right 10/05/2022   Procedure: APPLICATION OF WOUND VAC;  Surgeon: Harden Jerona GAILS, MD;  Location: [facility] OR;  Service: Orthopedics;  Laterality: Right;   BIOPSY  07/18/2021   Procedure: BIOPSY;  Surgeon: [name], DO;  [facility];  Service: Endoscopy;;   BIOPSY  01/09/2022   Procedure: BIOPSY;  Surgeon: [name], DO;  [facility];  Service: Endoscopy;;   BIOPSY  06/29/2023   Procedure: BIOPSY;  Surgeon: [name], DO;  [facility];  Service: Endoscopy;;   COLONOSCOPY WITH PROPOFOL  N/A 07/18/2021   Carver: 15 millimeter polyp removed from the sigmoid colon, 5 mm polyp removed from sigmoid colon.  Nonbleeding internal hemorrhoids.  Significant looping of the colon. sigmoid path showed invasive colonic adenocarcinoma involving tubular adenoma (invades to depth of 2mm, carcinoma 1mm from margin, no lymphovascular invasion, no poorly differentiated component.   COLONOSCOPY WITH PROPOFOL  N/A 06/29/2023   Procedure: COLONOSCOPY WITH PROPOFOL ;  Surgeon: [name], DO;  [facility];  Service: Endoscopy;  Laterality: N/A;  7:30 am, asa 3   CORONARY ARTERY BYPASS GRAFT N/A 03/13/2020   Procedure: CORONARY ARTERY BYPASS GRAFTING (CABG) times five using bilateral Internal mammary arteries and left radial artery;  Surgeon: German Bartlett PEDLAR, MD;  Location: [facility] OR;  Service: Open Heart Surgery;  Laterality: N/A;   DENTAL SURGERY     ESOPHAGEAL DILATION N/A 07/22/2024   Procedure: DILATION, ESOPHAGUS;  Surgeon: [name], DO;  [facility];  Service: Endoscopy;  Laterality: N/A;   ESOPHAGOGASTRODUODENOSCOPY N/A 07/22/2024   Procedure: EGD (ESOPHAGOGASTRODUODENOSCOPY);  Surgeon: [name], DO;  [facility];  Service: Endoscopy;  Laterality: N/A;  9:00 am, asa 3    ESOPHAGOGASTRODUODENOSCOPY (EGD) WITH PROPOFOL  N/A 07/18/2021   Carver: 1 gastric polyp status post biopsy, gastritis. gastric bx with slight chronic inflammation and no H.pyori. GEJ polypectomy with mild inflammation only   FLEXIBLE SIGMOIDOSCOPY N/A 08/26/2021   Carver: Nonbleeding internal hemorrhoids.  15 mm ulcers from previous polypectomy found in the rectum.  No evidence of previous polyp.  Located 5 to 8 cm from anal verge.   FLEXIBLE SIGMOIDOSCOPY N/A 01/09/2022   Procedure: FLEXIBLE SIGMOIDOSCOPY;  Surgeon: [name], DO;  [facility];  Service: Endoscopy;  Laterality: N/A;   IR CYSTOSTOMY TUBE PLACEMENT/BLADDER ASPIRATION  03/11/2024   IRRIGATION AND DEBRIDEMENT ELBOW Left 02/20/2024   Procedure: LEFT ELBOW IRRIGATION AND DEBRIDEMENT;  Surgeon: Germaine Redbird, MD;  Location: [facility] OR;  Service: Orthopedics;  Laterality: Left;   POLYPECTOMY  07/18/2021   Procedure: POLYPECTOMY INTESTINAL;  Surgeon: [name], DO;  [facility];  Service: Endoscopy;;   POLYPECTOMY  06/29/2023   Procedure: POLYPECTOMY INTESTINAL;  Surgeon: [name], DO;  [facility];  Service: Endoscopy;;   RADIAL ARTERY HARVEST Left  03/13/2020   Procedure: RADIAL ARTERY HARVEST,;  Surgeon: German Bartlett PEDLAR, MD;  Location: [facility] OR;  Service: Open Heart Surgery;  Laterality: Left;   RIGHT/LEFT HEART CATH AND CORONARY ANGIOGRAPHY N/A 03/07/2020   Procedure: RIGHT/LEFT HEART CATH AND CORONARY ANGIOGRAPHY;  Surgeon: Wonda Sharper, MD;  Location: [facility] INVASIVE CV LAB;  Service: Cardiovascular;  Laterality: N/A;   STUMP REVISION Right 12/17/2022   Procedure: RIGHT BELOW KNEE AMPUTATION REVISION;  Surgeon: Harden Jerona GAILS, MD;  Location: [facility] OR;  Service: Orthopedics;  Laterality: Right;   SUBMUCOSAL LIFTING INJECTION  01/09/2022   Procedure: SUBMUCOSAL LIFTING INJECTION;  Surgeon: [name], DO;  [facility];  Service: Endoscopy;;   SUBMUCOSAL TATTOO INJECTION   01/09/2022   Procedure: SUBMUCOSAL TATTOO INJECTION;  Surgeon: [name], DO;  [facility];  Service: Endoscopy;;   TEE WITHOUT CARDIOVERSION N/A 03/13/2020   Procedure: TRANSESOPHAGEAL ECHOCARDIOGRAM (TEE);  Surgeon: German Bartlett PEDLAR, MD;  Location: [facility] OR;  Service: Open Heart Surgery;  Laterality: N/A;    Current Outpatient Medications  Medication Sig Dispense Refill   acetaminophen  (TYLENOL ) 500 MG tablet Take 500 mg by mouth every 8 (eight) hours as needed for moderate pain (pain score 4-6).     aspirin  81 MG EC tablet Take 1 tablet (81 mg total) by mouth daily with breakfast. 30 tablet 12   atorvastatin  (LIPITOR ) 40 MG tablet Take 1 tablet (40 mg total) by mouth at bedtime.     AUVELITY  45-105 MG TBCR Take 1 tablet by mouth daily.     bacitracin  500 UNIT/GM ointment Apply 1 Application topically 2 (two) times daily. 15 g 0   cabotegravir  ER (APRETUDE ) 600 MG/3ML injection Inject 3 mLs (600 mg total) into the muscle every 30 (thirty) days. 3 mL 0   cetirizine (ZYRTEC) 10 MG tablet Take 10 mg by mouth daily as needed for allergies.     Coenzyme Q10 100 MG TABS Take 100 mg by mouth daily.     Docosahexaenoic Acid (DHA PO) Take 1 capsule by mouth daily. Dosage not listed on MAR     ferrous gluconate  (FERGON) 324 MG tablet Take 324 mg by mouth daily.     fluticasone  (FLONASE ) 50 MCG/ACT nasal spray Place 1 spray into both nostrils daily.     fluticasone -salmeterol (ADVAIR) 500-50 MCG/ACT AEPB Inhale 1 puff into the lungs in the morning and at bedtime.     gabapentin  (NEURONTIN ) 400 MG capsule Take 1 capsule (400 mg total) by mouth 2 (two) times daily.     HYDROcodone -acetaminophen  (NORCO/VICODIN) 5-325 MG tablet Take 1 tablet by mouth every 4 (four) hours as needed (pain).     insulin  aspart (NOVOLOG  FLEXPEN) 100 UNIT/ML FlexPen 0-15 Units, Subcutaneous, 3 times daily with meals CBG < 70: implement hypoglycemia protocol-call MD CBG 70 - 120: 0 units CBG 121 - 150: 2  units CBG 151 - 200: 3 units CBG 201 - 250: 5 units CBG 251 - 300: 8 units CBG 301 - 350: 11 units CBG 351 - 400: 15 units CBG > 400: 15 mL 0   lactulose  (CHRONULAC ) 10 GM/15ML solution Take 15 mLs (10 g total) by mouth 2 (two) times daily. 473 mL 0   lamoTRIgine  (LAMICTAL ) 100 MG tablet Take 100 mg by mouth 2 (two) times daily.     levothyroxine  (SYNTHROID ) 100 MCG tablet Take 100 mcg by mouth daily.     lubiprostone  (AMITIZA ) 24 MCG capsule Take 1 capsule (24 mcg total) by  mouth 2 (two) times daily with a meal. 60 capsule 3   metolazone  (ZAROXOLYN ) 2.5 MG tablet Take 1 tablet (2.5 mg total) by mouth once a week. On Wednesdays     Multiple Vitamin (MULTIVITAMIN) tablet Take 1 tablet by mouth daily.     mupirocin  ointment (BACTROBAN ) 2 % Place 1 Application into the nose 2 (two) times daily. 22 g 0   Nutritional Supplements (FEEDING SUPPLEMENT, NEPRO CARB STEADY,) LIQD Take 120 mLs by mouth 2 (two) times daily between meals.     OXYGEN Inhale 2 L/min into the lungs continuous.     pantoprazole  (PROTONIX ) 40 MG tablet Take 1 tablet (40 mg total) by mouth daily. Before breakfast 90 tablet 3   polyethylene glycol (MIRALAX  / GLYCOLAX ) 17 g packet Take 17 g by mouth 2 (two) times daily. 14 each 0   polyvinyl alcohol  (LIQUIFILM TEARS) 1.4 % ophthalmic solution Place 1 drop into both eyes daily.     senna-docusate (SENOKOT-S) 8.6-50 MG tablet Take 2 tablets by mouth at bedtime.     sodium bicarbonate  325 MG tablet Take 325 mg by mouth 2 (two) times daily.     tamsulosin  (FLOMAX ) 0.4 MG CAPS capsule Take 0.8 mg by mouth every evening.     thiamine  (VITAMIN B-1) 100 MG tablet Take 1 tablet (100 mg total) by mouth daily.     torsemide  (DEMADEX ) 20 MG tablet Take 1 tablet (20 mg total) by mouth 2 (two) times daily.     traZODone  (DESYREL ) 100 MG tablet Take 1 tablet (100 mg total) by mouth at bedtime.     UNABLE TO FIND Med Name: Luliconazole Cream     Vitamin D , Ergocalciferol , (DRISDOL ) 1.25 MG  (50000 UNIT) CAPS capsule Take 50,000 Units by mouth every 7 (seven) days. Mondays     carvedilol  (COREG ) 25 MG tablet Take 0.5 tablets (12.5 mg total) by mouth 2 (two) times daily with a meal. (Patient not taking: Reported on 09/14/2024)     No current facility-administered medications for this visit.    Allergies as of 09/14/2024 - Review Complete 09/14/2024  Allergen Reaction Noted   Ceftriaxone  Anaphylaxis 09/29/2023   Ace inhibitors Swelling and Cough 12/11/2017   Chlorhexidine   10/25/2023   Haloperidol  and related  10/14/2022   Other Itching 11/15/2019   Pork allergy Other (See Comments) 02/20/2024   Shellfish allergy  10/05/2022   Penicillins Itching and Rash 12/11/2017    Family History  Problem Relation Age of Onset   Hypertension Mother     Social History   Socioeconomic History   Marital status: Single    Spouse name: Not on file   Number of children: Not on file   Years of education: Not on file   Highest education level: Not on file  Occupational History   Not on file  Tobacco Use   Smoking status: Former    Current packs/day: 0.00    Types: Cigarettes    Quit date: 05/25/1995    Years since quitting: 29.3   Smokeless tobacco: Never  Vaping Use   Vaping status: Never Used  Substance and Sexual Activity   Alcohol  use: Not Currently    Comment: rarely   Drug use: No   Sexual activity: Not Currently  Other Topics Concern   Not on file  Social History Narrative   Not on file   Social Drivers of Health   Financial Resource Strain: Not on file  Food Insecurity: No Food Insecurity (08/10/2024)   Hunger  Vital Sign    Worried About Programme Researcher, Broadcasting/film/video in the Last Year: Never true    Ran Out of Food in the Last Year: Never true  Transportation Needs: No Transportation Needs (08/10/2024)   PRAPARE - Administrator, Civil Service (Medical): No    Lack of Transportation (Non-Medical): No  Physical Activity: Not on file  Stress: Not on file   Social Connections: Unknown (02/21/2024)   Social Connection and Isolation Panel    Frequency of Communication with Friends and Family: Not on file    Frequency of Social Gatherings with Friends and Family: Not on file    Attends Religious Services: Not on file    Active Member of Clubs or Organizations: Not on file    Attends Banker Meetings: Not on file    Marital Status: Never married  Intimate Partner Violence: Not At Risk (08/10/2024)   Humiliation, Afraid, Rape, and Kick questionnaire    Fear of Current or Ex-Partner: No    Emotionally Abused: No    Physically Abused: No    Sexually Abused: No     Review of Systems   Gen: Denies any fever, chills, fatigue, weight loss, lack of appetite.  CV: Denies chest pain, heart palpitations, peripheral edema, syncope.  Resp: Denies shortness of breath at rest or with exertion. Denies wheezing or cough.  GI: see HPI GU : Denies urinary burning, urinary frequency, urinary hesitancy MS: Denies joint pain, muscle weakness, cramps, or limitation of movement.  Derm: Denies rash, itching, dry skin Psych: Denies depression, anxiety, memory loss, and confusion Heme: Denies bruising, bleeding, and enlarged lymph nodes.   Physical Exam   BP (!) 152/67   Pulse 67   Temp 98.2 F (36.8 C)   Ht 5' 10 (1.778 m)   Wt 247 lb 11.2 oz (112.4 kg)   BMI 35.54 kg/m  General:   Alert and oriented. Pleasant and cooperative. Well-nourished and well-developed.  Head:  Normocephalic and atraumatic. Eyes:  Without icterus Abdomen:  +BS, soft, non-tender and non-distended. No HSM noted. No guarding or rebound. No masses appreciated.  Rectal:  Deferred  Msk:  right BKA Extremities:  Without edema. Neurologic:  Alert and  oriented x4;  grossly normal neurologically. Skin:  Intact without significant lesions or rashes. Psych:  Alert and cooperative. Normal mood and affect.   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

## 2024-09-16 ENCOUNTER — Other Ambulatory Visit: Payer: Self-pay

## 2024-09-19 ENCOUNTER — Telehealth: Payer: Self-pay

## 2024-09-19 NOTE — Telephone Encounter (Signed)
 [facility] Patient Advocate Encounter  Patient's medications Apretude  have been couriered to [facility] from [facility] and will be administered at the patients appointment on 09/22/24.  Arland Hutchinson, CPhT [facility] Phone: [phone] Fax:  [phone]

## 2024-09-21 NOTE — Progress Notes (Cosign Needed Addendum)
 "  HPI: Shane Chambers is a 63 y.o. male who presents to the [facility] pharmacy clinic for Apretude  administration and HIV PrEP follow up.  Insured   [x]    Uninsured  []    Referring ID Physician: Dr. Fleeta Rothman  Patient Active Problem List   Diagnosis Date Noted   RUQ abdominal pain 08/10/2024   UTI (urinary tract infection) 08/10/2024   NSTEMI (non-ST elevated myocardial infarction) (HCC) 08/09/2024   Encounter for HIV pre-exposure prophylaxis 07/25/2024   Dysphagia 06/29/2024   Gastroesophageal reflux disease 06/29/2024   Allergy to cephalosporin 02/22/2024   Septic joint (HCC) 02/20/2024   Chronic retention of urine 02/09/2024   Neurogenic bladder 02/09/2024   Suprapubic catheter (HCC) 02/09/2024   Ambulatory dysfunction 02/09/2024   Medically complex patient 02/09/2024   Behavior concern in adult 12/16/2023   Anemia of chronic renal failure 12/08/2023   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   Type II diabetes mellitus with renal manifestations (HCC) 07/30/2023   Chronic diastolic CHF (congestive heart failure) (HCC) 07/30/2023   Dyslipidemia 03/27/2023   Hypothyroidism 03/27/2023   CKD (chronic kidney disease), stage IV (HCC) 03/27/2023   Moderate aortic stenosis 03/27/2023   Hyperkalemia 02/15/2023   Pulmonary edema 02/15/2023   Bilateral pleural effusion 02/15/2023   Nausea & vomiting 02/15/2023   Chronic diarrhea 02/15/2023   High anion gap metabolic acidosis 02/14/2023   S/P BKA (below knee amputation) unilateral, right (HCC) 12/17/2022   MRSA infection 11/19/2022   Amputation stump infection (HCC) 11/19/2022   QT prolongation 11/10/2022   Severe recurrent major depression without psychotic features (HCC) 11/05/2022   Major depressive disorder with psychotic features (HCC) 11/02/2022   Acute osteomyelitis (HCC) 10/02/2022   Subacute osteomyelitis, right ankle and foot (HCC) 10/02/2022   Hypoalbuminemia due to protein-calorie malnutrition 10/02/2022   Cellulitis of right lower  extremity 09/25/2022   Morbid obesity (HCC) 04/23/2022   Elevated troponin 04/22/2022   Cancer of sigmoid colon (HCC) 12/16/2021   Constipation 12/16/2021   GERD without esophagitis 12/16/2021   Iron  deficiency anemia 09/17/2021   Rectal cancer (HCC) 09/03/2021   Bilateral lower leg cellulitis 11/22/2020   Systolic and diastolic CHF, chronic (HCC) 11/22/2020   OSA (obstructive sleep apnea) 05/24/2020   COPD (chronic obstructive pulmonary disease) (HCC) 04/29/2020   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   ASPIRIN  81 MG EC TABLET    Take 1 tablet (81 mg total) by mouth daily with breakfast.   ATORVASTATIN  (LIPITOR ) 40 MG TABLET    Take 1 tablet (40 mg total) by mouth at bedtime.   AUVELITY  45-105 MG TBCR    Take 1 tablet by mouth daily.   BACITRACIN  500 UNIT/GM OINTMENT    Apply 1 Application topically 2 (two) times daily.   CABOTEGRAVIR  ER (APRETUDE ) 600 MG/3ML INJECTION    Inject 3 mLs (600 mg total) into the muscle every 30 (thirty) days.   CARVEDILOL  (COREG )  25 MG TABLET    Take 0.5 tablets (12.5 mg total) by mouth 2 (two) times daily with a meal.   CETIRIZINE (ZYRTEC) 10 MG TABLET    Take 10 mg by mouth daily as needed for allergies.   COENZYME Q10 100 MG TABS    Take 100 mg by mouth daily.   DOCOSAHEXAENOIC ACID (DHA PO)    Take 1 capsule by mouth daily. Dosage not listed on MAR   FERROUS GLUCONATE  (FERGON) 324 MG TABLET    Take 324 mg by mouth  daily.   FLUTICASONE  (FLONASE ) 50 MCG/ACT NASAL SPRAY    Place 1 spray into both nostrils daily.   FLUTICASONE -SALMETEROL (ADVAIR) 500-50 MCG/ACT AEPB    Inhale 1 puff into the lungs in the morning and at bedtime.   GABAPENTIN  (NEURONTIN ) 400 MG CAPSULE    Take 1 capsule (400 mg total) by mouth 2 (two) times daily.   HYDROCODONE -ACETAMINOPHEN  (NORCO/VICODIN) 5-325 MG TABLET    Take 1 tablet by mouth every 4 (four) hours as needed (pain).   INSULIN  ASPART (NOVOLOG  FLEXPEN) 100 UNIT/ML FLEXPEN    0-15 Units, Subcutaneous, 3 times daily with meals CBG < 70: implement hypoglycemia protocol-call MD CBG 70 - 120: 0 units CBG 121 - 150: 2 units CBG 151 - 200: 3 units CBG 201 - 250: 5 units CBG 251 - 300: 8 units CBG 301 - 350: 11 units CBG 351 - 400: 15 units CBG > 400:   LACTULOSE  (CHRONULAC ) 10 GM/15ML SOLUTION    Take 15 mLs (10 g total) by mouth 2 (two) times daily.   LAMOTRIGINE  (LAMICTAL ) 100 MG TABLET    Take 100 mg by mouth 2 (two) times daily.   LEVOTHYROXINE  (SYNTHROID ) 100 MCG TABLET    Take 100 mcg by mouth daily.   LUBIPROSTONE  (AMITIZA ) 24 MCG CAPSULE    Take 1 capsule (24 mcg total) by mouth 2 (two) times daily with a meal.   METOLAZONE  (ZAROXOLYN ) 2.5 MG TABLET    Take 1 tablet (2.5 mg total) by mouth once a week. On Wednesdays   MULTIPLE VITAMIN (MULTIVITAMIN) TABLET    Take 1 tablet by mouth daily.   MUPIROCIN  OINTMENT (BACTROBAN ) 2 %    Place 1 Application into the nose 2 (two) times daily.   NALOXEGOL  OXALATE (MOVANTIK ) 12.5 MG TABS TABLET    Take 1 tablet (12.5 mg total) by mouth daily. 1 hour before eating   NUTRITIONAL SUPPLEMENTS (FEEDING SUPPLEMENT, NEPRO CARB STEADY,) LIQD    Take 120 mLs by mouth 2 (two) times daily between meals.   NYSTATIN  (MYCOSTATIN ) 100000 UNIT/ML SUSPENSION    Take 5 mLs (500,000 Units total) by mouth 4 (four) times daily for 14 days.   OXYGEN    Inhale 2 L/min into the lungs continuous.   PANTOPRAZOLE  (PROTONIX ) 40 MG TABLET    Take 1 tablet (40  mg total) by mouth daily. Before breakfast   POLYETHYLENE GLYCOL (MIRALAX  / GLYCOLAX ) 17 G PACKET    Take 17 g by mouth 2 (two) times daily.   POLYVINYL ALCOHOL  (LIQUIFILM TEARS) 1.4 % OPHTHALMIC SOLUTION    Place 1 drop into both eyes daily.   SENNA-DOCUSATE (SENOKOT-S) 8.6-50 MG TABLET    Take 2 tablets by mouth at bedtime.   SODIUM BICARBONATE  325 MG TABLET    Take 325 mg by mouth 2 (two) times daily.   TAMSULOSIN  (FLOMAX ) 0.4 MG CAPS CAPSULE    Take 0.8 mg by mouth every evening.   THIAMINE  (VITAMIN  B-1) 100 MG TABLET    Take 1 tablet (100 mg total) by mouth daily.   TORSEMIDE  (DEMADEX ) 20 MG TABLET    Take 1 tablet (20 mg total) by mouth 2 (two) times daily.   TRAZODONE  (DESYREL ) 100 MG TABLET    Take 1 tablet (100 mg total) by mouth at bedtime.   UNABLE TO FIND    Med Name: Luliconazole Cream   VITAMIN D , ERGOCALCIFEROL , (DRISDOL ) 1.25 MG (50000 UNIT) CAPS CAPSULE    Take 50,000 Units by mouth every 7 (seven) days. Mondays  Modified Medications   No medications on file  Discontinued Medications   No medications on file    Allergies: Allergies  Allergen Reactions   Ceftriaxone  Anaphylaxis    (Rocephin ) Previously tolerated at [facility] during multiple admissions. Patient reports SOB reaction in [place], Summer 2024. Now reporting SOB reaction at [facility] 02/21/24. No swelling, or skin reaction. Satted well on baseline oxygen. Does not wish to receive Ceftriaxone  in the future.   Ace Inhibitors Swelling and Cough   Chlorhexidine     Haloperidol  And Related     Do NOT give anti-psychotics due to risk of  Torsades and QT prolongation (per Dr. Trude request)   Other Itching    [name]   Pork Allergy Other (See Comments)    Not listed in [facility]   Shellfish Allergy     Listed on MAR   Penicillins Itching and Rash    Tolerated ancef  (12-17-22)    Past Medical History: Past Medical History:  Diagnosis Date   Acute on chronic combined systolic and diastolic CHF (congestive  heart failure) (HCC) 03/05/2020   Acute on chronic heart failure with preserved ejection fraction (HFpEF) (HCC) 03/27/2023   Anemia    Arthritis    CAD (coronary artery disease)    a. s/p CABG in 03/2020 with LIMA-LAD, RIMA-PL, [id number]   Cancer (HCC)    rectal   Cellulitis    COPD (chronic obstructive pulmonary disease) (HCC)    Depression    Diabetes mellitus without complication (HCC)    Encounter for HIV pre-exposure prophylaxis 07/25/2024   GERD (gastroesophageal reflux disease)    History of kidney stones    Hyperlipidemia 12/01/2019   Hypertension    Hypothyroidism    Ischemic cardiomyopathy    a. EF 20-25% by echo in 02/2020 b. at 40% by echo in 03/2020 c. EF normalized to 60-65% by echo in 04/2020   Myocardial infarction [facility])    Peripheral vascular disease    Sleep apnea    Type 2 diabetes mellitus (HCC)     Social History: Social History   Socioeconomic History   Marital status: Single    Spouse name: Not on file   Number of children: Not on file   Years of education: Not on file   Highest education level: Not on file  Occupational History   Not on file  Tobacco Use   Smoking status: Former    Current packs/day: 0.00    Types: Cigarettes    Quit date: 05/25/1995    Years since quitting: 29.3   Smokeless tobacco: Never  Vaping Use   Vaping status: Never Used  Substance and Sexual Activity   Alcohol  use: Not Currently    Comment: rarely   Drug use: No   Sexual activity: Not Currently  Other Topics Concern   Not on file  Social History Narrative   Not on file   Social Drivers of Health  Financial Resource Strain: Not on file  Food Insecurity: No Food Insecurity (08/10/2024)   Hunger Vital Sign    Worried About Running Out of Food in the Last Year: Never true    Ran Out of Food in the Last Year: Never true  Transportation Needs: No Transportation Needs (08/10/2024)   PRAPARE - Administrator, Civil Service (Medical): No    Lack of  Transportation (Non-Medical): No  Physical Activity: Not on file  Stress: Not on file  Social Connections: Unknown (02/21/2024)   Social Connection and Isolation Panel    Frequency of Communication with Friends and Family: Not on file    Frequency of Social Gatherings with Friends and Family: Not on file    Attends Religious Services: Not on file    Active Member of Clubs or Organizations: Not on file    Attends Banker Meetings: Not on file    Marital Status: Never married    Labs: No results found for: HIV1RNAQUANT, HIV1RNAVL, CD4TABS  RPR and STI Lab Results  Component Value Date   LABRPR NON-REACTIVE 05/25/2024        No data to display          Hepatitis B Lab Results  Component Value Date   HEPBSAG NON-REACTIVE 07/27/2024   Hepatitis C Lab Results  Component Value Date   HEPCAB NON-REACTIVE 07/27/2024   Hepatitis A Lab Results  Component Value Date   HAV REACTIVE (A) 07/27/2024   Lipids: Lab Results  Component Value Date   CHOL 87 08/11/2024   TRIG 74 08/11/2024   HDL 27 (L) 08/11/2024   CHOLHDL 3.2 08/11/2024   VLDL 15 08/11/2024   LDLCALC 45 08/11/2024    TARGET DATE: The 20th of the month  Assessment: Nawaf presents today for their Apretude  injection and to follow up for HIV PrEP. No issues with past injections. Screened patient for acute HIV symptoms such as fatigue, muscle aches, rash, sore throat, lymphadenopathy, headache, night sweats, nausea/vomiting/diarrhea, and fever. Patient denies any symptoms. States he has not been sexually active at this time but plans to in the future and would like to continue on Apretude  in preparation. Discussed Yeztugo, but he would like to stick to Apretude  for now.   Administered cabotegravir  600mg /36mL in right upper outer quadrant of the gluteal muscle. Will make follow up appointments for maintenance injections every 2 months.   Accepts COVID vaccination today. Already received flu  vaccine through nursing facility this year along with PPSV23 prior on 08/14/23. Unsure if he received 2/2 Shingles vaccine; will investigate further at next appointment.   Plan:. - Administer Apretude  600 mg x 1  - Maintenance injections scheduled for 11/28/24 with Dr. Fleeta Rothman and 01/20/25 with me  - Check HIV RNA  - Administer COVID Vaccine - Call with any issues or questions  Alan Geralds, PharmD, CPP, BCIDP, AAHIVP Clinical Pharmacist Practitioner Infectious Diseases Clinical Pharmacist [facility]  "

## 2024-09-22 ENCOUNTER — Other Ambulatory Visit: Payer: Self-pay

## 2024-09-22 ENCOUNTER — Ambulatory Visit: Admitting: Pharmacist

## 2024-09-22 DIAGNOSIS — Z2981 Encounter for HIV pre-exposure prophylaxis: Secondary | ICD-10-CM

## 2024-09-22 DIAGNOSIS — Z23 Encounter for immunization: Secondary | ICD-10-CM

## 2024-09-22 DIAGNOSIS — Z79899 Other long term (current) drug therapy: Secondary | ICD-10-CM

## 2024-09-22 MED ORDER — CABOTEGRAVIR ER 600 MG/3ML IM SUER
600.0000 mg | Freq: Once | INTRAMUSCULAR | Status: AC
  Administered 2024-09-22: 600 mg via INTRAMUSCULAR

## 2024-09-23 ENCOUNTER — Ambulatory Visit (HOSPITAL_COMMUNITY)
Admission: RE | Admit: 2024-09-23 | Discharge: 2024-09-23 | Disposition: A | Discharge status: Home / Self Care | Source: Ambulatory Visit | Attending: Gastroenterology | Admitting: Gastroenterology

## 2024-09-23 DIAGNOSIS — R1310 Dysphagia, unspecified: Secondary | ICD-10-CM | POA: Insufficient documentation

## 2024-09-24 LAB — TEST AUTHORIZATION: TEST NAME:: 40085

## 2024-09-24 LAB — HIV-1 RNA QUANT-NO REFLEX-BLD
HIV 1 RNA Quant: NOT DETECTED {copies}/mL
HIV-1 RNA Quant, Log: NOT DETECTED {Log_copies}/mL

## 2024-10-10 ENCOUNTER — Ambulatory Visit

## 2024-10-10 DIAGNOSIS — N319 Neuromuscular dysfunction of bladder, unspecified: Secondary | ICD-10-CM

## 2024-10-10 DIAGNOSIS — R339 Retention of urine, unspecified: Secondary | ICD-10-CM

## 2024-10-10 LAB — GENECONNECT MOLECULAR SCREEN

## 2024-10-10 MED ORDER — CIPROFLOXACIN HCL 500 MG PO TABS
500.0000 mg | ORAL_TABLET | Freq: Once | ORAL | Status: AC
  Administered 2024-10-10: 500 mg via ORAL

## 2024-10-10 NOTE — Addendum Note (Signed)
 Addended by: GRETTA MASTERS R on: 10/10/2024 11:47 AM   Modules accepted: Level of Service

## 2024-10-10 NOTE — Progress Notes (Signed)
 Suprapubic Cath Change  Patient is present today for a suprapubic catheter change due to urinary retention.  10 ml of water  was drained from the balloon, a 20 FR foley cath was removed from the tract with out difficulty.  Suprapubic catheter site was cleaned and prepped in a sterile fashion with Betadinex3  A 20 FR foley cath was replaced into the tract no complications were noted. Urine return was noted, urine Bloody in color . 10 ml of sterile water  was inflated into the balloon and a night bag was attached for drainage.  Patient tolerated well. A night bag was given to patient and proper instruction was given on how to switch bags.    Performed by: Carlos, CMA  Follow up: 4 weeks SP Tube Change

## 2024-10-11 ENCOUNTER — Telehealth: Payer: Self-pay | Admitting: Medical Genetics

## 2024-10-11 DIAGNOSIS — Z006 Encounter for examination for normal comparison and control in clinical research program: Secondary | ICD-10-CM

## 2024-10-17 ENCOUNTER — Ambulatory Visit: Admitting: Internal Medicine

## 2024-10-18 NOTE — Telephone Encounter (Signed)
 [facility]  10/18/2024 9:12 AM  Confirmed I was speaking with Shane Chambers [phone] by using name and DOB. Informed participant the reason for this call is to follow-up on a recent sample the participant provided at one of the [facility] lab locations. Informed participant the test was not able to be completed with this sample and apologized for the inconvenience. Participant was requested to provide a new sample at one of our participating labs at no cost so that participant can continue participation and receive test results. Informed participant they do not need to be fasting and if there are other samples that need to be drawn, they can be done at the same visit. Participant has not had a blood transfusion or blood product in the last 30 days. Participant agreed to provide another sample. Participant was provided the [employer] program website to learn why this may have happened. Participant was thanked for their time and continued support of the above study.    Jordyn Pennstrom, BS [place]  [facility] Clinical Research Specialist II Direct Dial: [phone]  Fax: [phone]

## 2024-10-28 ENCOUNTER — Ambulatory Visit: Payer: Self-pay | Admitting: Gastroenterology

## 2024-11-14 ENCOUNTER — Ambulatory Visit

## 2024-11-14 DIAGNOSIS — R339 Retention of urine, unspecified: Secondary | ICD-10-CM | POA: Diagnosis not present

## 2024-11-14 DIAGNOSIS — N319 Neuromuscular dysfunction of bladder, unspecified: Secondary | ICD-10-CM

## 2024-11-14 MED ORDER — CIPROFLOXACIN HCL 500 MG PO TABS
500.0000 mg | ORAL_TABLET | Freq: Once | ORAL | Status: AC
  Administered 2024-11-14: 500 mg via ORAL

## 2024-11-14 NOTE — Progress Notes (Signed)
 Suprapubic Cath Change  Patient is present today for a suprapubic catheter change due to urinary retention.  10 ml of water  was drained from the balloon, a 20 FR foley cath was removed from the tract with out difficulty.  Suprapubic catheter site was cleaned and prepped in a sterile fashion with Betadinex3  A 20 FR foley cath was replaced into the tract no complications were noted. Urine return was noted, urine Clear yellow in color . 10 ml of sterile water  was inflated into the balloon and a night bag was attached for drainage.  Patient tolerated well. A night bag was given to patient and proper instruction was given on how to switch bags.    Performed by: Carlos, CMA  Follow up: 4 weeks SP tube Change

## 2024-11-23 ENCOUNTER — Other Ambulatory Visit (HOSPITAL_COMMUNITY): Payer: Self-pay

## 2024-11-23 ENCOUNTER — Other Ambulatory Visit: Payer: Self-pay | Admitting: Pharmacist

## 2024-11-23 ENCOUNTER — Other Ambulatory Visit: Payer: Self-pay

## 2024-11-23 DIAGNOSIS — Z2981 Encounter for HIV pre-exposure prophylaxis: Secondary | ICD-10-CM

## 2024-11-23 MED ORDER — APRETUDE 600 MG/3ML IM SUER
600.0000 mg | INTRAMUSCULAR | 5 refills | Status: AC
  Filled 2024-11-23 (×2): qty 3, 60d supply, fill #0

## 2024-11-23 NOTE — Progress Notes (Signed)
 Specialty Pharmacy Refill Coordination Note  Shane Chambers is a 64 y.o. male assessed today regarding refills of clinic administered specialty medication(s) Cabotegravir  (Apretude )   Clinic requested Courier to Provider Office   Delivery date: 11/28/24   Verified address: [address] [place] [address]   Medication will be filled on 11/25/24.

## 2024-11-25 ENCOUNTER — Other Ambulatory Visit (HOSPITAL_COMMUNITY): Payer: Self-pay

## 2024-11-25 ENCOUNTER — Other Ambulatory Visit: Payer: Self-pay

## 2024-11-28 ENCOUNTER — Ambulatory Visit: Payer: Self-pay | Admitting: Infectious Disease

## 2024-11-28 ENCOUNTER — Telehealth: Payer: Self-pay

## 2024-11-28 LAB — GENECONNECT MOLECULAR SCREEN: Genetic Analysis Overall Interpretation: NEGATIVE

## 2024-11-28 NOTE — Progress Notes (Unsigned)
 "  HPI: Shane Chambers is a 64 y.o. male who presents to the [facility] pharmacy clinic for Apretude  administration and HIV PrEP follow up.  Referring ID Provider: Dr. Fleeta Rothman  Patient Active Problem List   Diagnosis Date Noted   RUQ abdominal pain 08/10/2024   UTI (urinary tract infection) 08/10/2024   NSTEMI (non-ST elevated myocardial infarction) (HCC) 08/09/2024   Encounter for HIV pre-exposure prophylaxis 07/25/2024   Dysphagia 06/29/2024   Gastroesophageal reflux disease 06/29/2024   Allergy to cephalosporin 02/22/2024   Septic joint (HCC) 02/20/2024   Chronic retention of urine 02/09/2024   Neurogenic bladder 02/09/2024   Suprapubic catheter (HCC) 02/09/2024   Ambulatory dysfunction 02/09/2024   Medically complex patient 02/09/2024   Behavior concern in adult 12/16/2023   Anemia of chronic renal failure 12/08/2023   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   Type II diabetes mellitus with renal manifestations (HCC) 07/30/2023   Chronic diastolic CHF (congestive heart failure) (HCC) 07/30/2023   Dyslipidemia 03/27/2023   Hypothyroidism 03/27/2023   CKD (chronic kidney disease), stage IV (HCC) 03/27/2023   Moderate aortic stenosis 03/27/2023   Hyperkalemia 02/15/2023   Pulmonary edema 02/15/2023   Bilateral pleural effusion 02/15/2023   Nausea & vomiting 02/15/2023   Chronic diarrhea 02/15/2023   High anion gap metabolic acidosis 02/14/2023   S/P BKA (below knee amputation) unilateral, right (HCC) 12/17/2022   MRSA infection 11/19/2022   Amputation stump infection (HCC) 11/19/2022   QT prolongation 11/10/2022   Severe recurrent major depression without psychotic features (HCC) 11/05/2022   Major depressive disorder with psychotic features (HCC) 11/02/2022   Acute osteomyelitis (HCC) 10/02/2022   Subacute osteomyelitis, right ankle and foot (HCC) 10/02/2022   Hypoalbuminemia due to protein-calorie malnutrition 10/02/2022   Cellulitis of right lower extremity 09/25/2022   Morbid  obesity (HCC) 04/23/2022   Elevated troponin 04/22/2022   Cancer of sigmoid colon (HCC) 12/16/2021   Constipation 12/16/2021   GERD without esophagitis 12/16/2021   Iron  deficiency anemia 09/17/2021   Rectal cancer (HCC) 09/03/2021   Bilateral lower leg cellulitis 11/22/2020   Systolic and diastolic CHF, chronic (HCC) 11/22/2020   OSA (obstructive sleep apnea) 05/24/2020   COPD (chronic obstructive pulmonary disease) (HCC) 04/29/2020   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   ASPIRIN  81 MG EC TABLET    Take 1 tablet (81 mg total) by mouth daily with breakfast.   ATORVASTATIN  (LIPITOR ) 40 MG TABLET    Take 1 tablet (40 mg total) by mouth at bedtime.   AUVELITY  45-105 MG TBCR    Take 1 tablet by mouth daily.   BACITRACIN  500 UNIT/GM OINTMENT    Apply 1 Application topically 2 (two) times daily.   CABOTEGRAVIR  ER (APRETUDE ) 600 MG/3ML INJECTION    Inject 3 mLs (600 mg total) into the muscle every 2 (two) months.   CARVEDILOL  (COREG ) 25 MG TABLET    Take 0.5 tablets (12.5 mg  total) by mouth 2 (two) times daily with a meal.   CETIRIZINE (ZYRTEC) 10 MG TABLET    Take 10 mg by mouth daily as needed for allergies.   COENZYME Q10 100 MG TABS    Take 100 mg by mouth daily.   DOCOSAHEXAENOIC ACID (DHA PO)    Take 1 capsule by mouth daily. Dosage not listed on MAR   FERROUS GLUCONATE  (FERGON) 324 MG TABLET    Take 324 mg by mouth daily.   FLUTICASONE  (FLONASE ) 50  MCG/ACT NASAL SPRAY    Place 1 spray into both nostrils daily.   FLUTICASONE -SALMETEROL (ADVAIR) 500-50 MCG/ACT AEPB    Inhale 1 puff into the lungs in the morning and at bedtime.   GABAPENTIN  (NEURONTIN ) 400 MG CAPSULE    Take 1 capsule (400 mg total) by mouth 2 (two) times daily.   HYDROCODONE -ACETAMINOPHEN  (NORCO/VICODIN) 5-325 MG TABLET    Take 1 tablet by mouth every 4 (four) hours as needed (pain).   INSULIN  ASPART (NOVOLOG  FLEXPEN) 100 UNIT/ML FLEXPEN    0-15 Units, Subcutaneous, 3 times daily with meals CBG < 70: implement hypoglycemia protocol-call MD CBG 70 - 120: 0 units CBG 121 - 150: 2 units CBG 151 - 200: 3 units CBG 201 - 250: 5 units CBG 251 - 300: 8 units CBG 301 - 350: 11 units CBG 351 - 400: 15 units CBG > 400:   LACTULOSE  (CHRONULAC ) 10 GM/15ML SOLUTION    Take 15 mLs (10 g total) by mouth 2 (two) times daily.   LAMOTRIGINE  (LAMICTAL ) 100 MG TABLET    Take 100 mg by mouth 2 (two) times daily.   LEVOTHYROXINE  (SYNTHROID ) 100 MCG TABLET    Take 100 mcg by mouth daily.   LUBIPROSTONE  (AMITIZA ) 24 MCG CAPSULE    Take 1 capsule (24 mcg total) by mouth 2 (two) times daily with a meal.   METOLAZONE  (ZAROXOLYN ) 2.5 MG TABLET    Take 1 tablet (2.5 mg total) by mouth once a week. On Wednesdays   MULTIPLE VITAMIN (MULTIVITAMIN) TABLET    Take 1 tablet by mouth daily.   MUPIROCIN  OINTMENT (BACTROBAN ) 2 %    Place 1 Application into the nose 2 (two) times daily.   NALOXEGOL  OXALATE (MOVANTIK ) 12.5 MG TABS TABLET    Take 1 tablet (12.5 mg total) by mouth daily. 1 hour before eating   NUTRITIONAL SUPPLEMENTS (FEEDING SUPPLEMENT, NEPRO CARB STEADY,) LIQD    Take 120 mLs by mouth 2 (two) times daily between meals.   OXYGEN    Inhale 2 L/min into the lungs continuous.   PANTOPRAZOLE  (PROTONIX ) 40 MG TABLET    Take 1 tablet (40 mg total) by mouth daily. Before breakfast   POLYETHYLENE GLYCOL (MIRALAX  / GLYCOLAX ) 17 G PACKET    Take 17 g by mouth 2 (two) times daily.   POLYVINYL ALCOHOL   (LIQUIFILM TEARS) 1.4 % OPHTHALMIC SOLUTION    Place 1 drop into both eyes daily.   SENNA-DOCUSATE (SENOKOT-S) 8.6-50 MG TABLET    Take 2 tablets by mouth at bedtime.   SODIUM BICARBONATE  325 MG TABLET    Take 325 mg by mouth 2 (two) times daily.   TAMSULOSIN  (FLOMAX ) 0.4 MG CAPS CAPSULE    Take 0.8 mg by mouth every evening.   THIAMINE  (VITAMIN B-1) 100 MG TABLET    Take 1 tablet (100 mg total) by mouth daily.   TORSEMIDE  (DEMADEX ) 20 MG TABLET    Take 1 tablet (20 mg total) by mouth 2 (two)  times daily.   TRAZODONE  (DESYREL ) 100 MG TABLET    Take 1 tablet (100 mg total) by mouth at bedtime.   UNABLE TO FIND    Med Name: Luliconazole Cream   VITAMIN D , ERGOCALCIFEROL , (DRISDOL ) 1.25 MG (50000 UNIT) CAPS CAPSULE    Take 50,000 Units by mouth every 7 (seven) days. Mondays  Modified Medications   No medications on file  Discontinued Medications   No medications on file    Allergies: Allergies[1]  Labs: Lab Results  Component Value Date   HIV1RNAQUANT NOT DETECTED 09/22/2024    RPR and STI Lab Results  Component Value Date   LABRPR NON-REACTIVE 05/25/2024        No data to display          Hepatitis B Lab Results  Component Value Date   HEPBSAG NON-REACTIVE 07/27/2024   Hepatitis C Lab Results  Component Value Date   HEPCAB NON-REACTIVE 07/27/2024   Hepatitis A Lab Results  Component Value Date   HAV REACTIVE (A) 07/27/2024   Lipids: Lab Results  Component Value Date   CHOL 87 08/11/2024   TRIG 74 08/11/2024   HDL 27 (L) 08/11/2024   CHOLHDL 3.2 08/11/2024   VLDL 15 08/11/2024   LDLCALC 45 08/11/2024    Target Date: 20th  Assessment: Shane Chambers presents today for his Apretude  injection and to follow up for HIV PrEP. No issues with past injections. Denies any symptoms of acute HIV. Last HIV RNA was negative on 09/22/24.   Routine labs:  HIV RNA today; discussed STI testing, will not do STI testing today since has not been sexually active with anyone  since last time STI testing was checked.   Eligible vaccinations:  Asked if Rosa was able to determine whether he had received a second dose of the Shingles vaccine. He said he wasn't sure. I recommended giving a dose today since we were unsure if he was fully protected against Shingles with 2-dose series and there is low risk of harm if he received an extra shot. He agrees to receive the Shingles vaccine today, which was administered in the left deltoid.   Apretude : Administered cabotegravir  600mg /38mL in right upper outer quadrant of the gluteal muscle. Will see Jaquell back in 2 months for next Apretude  injection, labs, and HIV PrEP follow up.  Plan: - Apretude  injection administered - HIV RNA today - Administer 2/2 Shingles vaccine - Next injection, labs, and PrEP follow up appointment scheduled for 3/13 with Alan - Call with any issues or questions  Maurilio Patten, PharmD PGY1 Pharmacy Resident [facility] 11/28/2024 8:32 PM     [1]  Allergies Allergen Reactions   Ceftriaxone  Anaphylaxis    (Rocephin ) Previously tolerated at [facility] during multiple admissions. Patient reports SOB reaction in [place], Summer 2024. Now reporting SOB reaction at [facility] 02/21/24. No swelling, or skin reaction. Satted well on baseline oxygen. Does not wish to receive Ceftriaxone  in the future.   Ace Inhibitors Swelling and Cough   Chlorhexidine     Haloperidol  And Related     Do NOT give anti-psychotics due to risk of  Torsades and QT prolongation (per Dr. Trude request)   Other Itching    [name]   Pork Allergy Other (See Comments)    Not listed in [facility]   Shellfish Allergy     Listed on MAR   Penicillins Itching and Rash    Tolerated ancef  (12-17-22)   "

## 2024-11-28 NOTE — Telephone Encounter (Signed)
 [facility] Patient Advocate Encounter  Patient's medications Apretude  have been couriered to [facility] from [facility] and will be administered at the patients appointment on 11/29/24.  Arland Hutchinson, CPhT Specialty Pharmacy Patient [facility] Phone: [phone] Fax:  [phone]

## 2024-11-29 ENCOUNTER — Ambulatory Visit (INDEPENDENT_AMBULATORY_CARE_PROVIDER_SITE_OTHER): Admitting: Pharmacist

## 2024-11-29 ENCOUNTER — Other Ambulatory Visit: Payer: Self-pay

## 2024-11-29 DIAGNOSIS — Z23 Encounter for immunization: Secondary | ICD-10-CM

## 2024-11-29 DIAGNOSIS — Z2981 Encounter for HIV pre-exposure prophylaxis: Secondary | ICD-10-CM | POA: Diagnosis not present

## 2024-11-29 MED ORDER — CABOTEGRAVIR ER 600 MG/3ML IM SUER
600.0000 mg | Freq: Once | INTRAMUSCULAR | Status: AC
  Administered 2024-11-29: 600 mg via INTRAMUSCULAR

## 2024-11-29 NOTE — Progress Notes (Signed)
 Shane Chambers

## 2024-11-30 DIAGNOSIS — R002 Palpitations: Secondary | ICD-10-CM

## 2024-12-01 LAB — HIV-1 RNA QUANT-NO REFLEX-BLD
HIV 1 RNA Quant: NOT DETECTED {copies}/mL
HIV-1 RNA Quant, Log: NOT DETECTED {Log_copies}/mL

## 2024-12-12 ENCOUNTER — Ambulatory Visit: Payer: Self-pay | Admitting: Cardiology

## 2024-12-14 ENCOUNTER — Encounter: Payer: Self-pay | Admitting: Gastroenterology

## 2024-12-14 ENCOUNTER — Ambulatory Visit: Admitting: Gastroenterology

## 2024-12-14 VITALS — BP 151/81 | HR 64 | Temp 98.2°F | Ht 70.0 in

## 2024-12-14 DIAGNOSIS — K219 Gastro-esophageal reflux disease without esophagitis: Secondary | ICD-10-CM

## 2024-12-14 DIAGNOSIS — K5900 Constipation, unspecified: Secondary | ICD-10-CM

## 2024-12-14 MED ORDER — BISACODYL 10 MG RE SUPP: 10.0000 mg | Freq: Every day | RECTAL | 1 refills | Status: AC

## 2024-12-14 MED ORDER — PANTOPRAZOLE SODIUM 40 MG PO TBEC: 40.0000 mg | DELAYED_RELEASE_TABLET | Freq: Two times a day (BID) | ORAL | 3 refills | Status: AC

## 2024-12-14 NOTE — Progress Notes (Unsigned)
 "   Gastroenterology Office Note     Primary Care Physician:  System, Provider Not In  Primary Gastroenterologist: Dr Cindie   Chief Complaint   Chief Complaint  Patient presents with   Follow-up    Patient here today for a follow up on dysphagia. Patient says he is still having issues with this off and on. He is currently taking Pantoprazole  40 mg once per day. He is still having issues occasionally with constipation. He is taking Movantik  daily, and taking senna and miralax  prn. He says he has had to take lactulose  as needed in the past also.      History of Present Illness   Shane Chambers is a 64 y.o. male presenting today with a history of rectal cancer diagnosed at initial screening colonoscopy in Sept 2022 without need for surgery or chemotherapy, followed by Oncology, normocytic anemia in setting of CKD and relative IDA    In the past: Amitiza  8 mcg BID, then increased to Amitiza  24 mcg at once daily dosing but wouldn't take if had to go somewhere as didn't want to have urgency. Linzess  caused diarrhea. Stopped Amitiza  at last visit and started movantik  12.5 mg daily. Still having to take Senna and Miralax . Still feels like could be better. Somewhat better than previously. Doesn't want to revisit Amitiza . Taking 2 tablet Senna BID and will decline when doesn't feel he needs this. Doesn't remember how often has diarrhea. He has been concerned about fiber in the past because of the constipation issues. When skips 2 days, will take something. States he has asked for suppository before but won't give to him until he tries all of the other stool softeners and laxatives. Will have a BM, then stop, then goes a little bit more later.   Pantoprazole  once a day. Was nauseated yesterday. Feel nauseated today. Has GERD at nighttime. Tries to sit upright after eating but not for 2-3 hours.    Dysphagia: BPE Nov 2025: IMPRESSION: Single-contrast esophagram significant for moderate  esophageal dysmotility. Exam extremely limited due to patient mobility.   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 KOH prep positive for yeast.   Aug 2024 with non-bleeding internal hemorrhoids, post-polypectomy rectal scar s/p biopsy and tattoo seen in rectum. One 7 mm cecal polyp. Tubular adenoma, negative recurrence on path. 3 year surveillance due.    EGD 2022: 1 gastric polyp s/p biopsy, gastritis. Gastritis and no H.pylori.   Past Medical History:  Diagnosis Date   Acute on chronic combined systolic and diastolic CHF (congestive heart failure) (HCC) 03/05/2020   Acute on chronic heart failure with preserved ejection fraction (HFpEF) (HCC) 03/27/2023   Anemia    Arthritis    CAD (coronary artery disease)    a. s/p CABG in 03/2020 with LIMA-LAD, RIMA-PL, [id number]   Cancer (HCC)    rectal   Cellulitis    COPD (chronic obstructive pulmonary disease) (HCC)    Depression    Diabetes mellitus without complication (HCC)    Encounter for HIV pre-exposure prophylaxis 07/25/2024   GERD (gastroesophageal reflux disease)    History of kidney stones    Hyperlipidemia 12/01/2019   Hypertension    Hypothyroidism    Ischemic  cardiomyopathy    a. EF 20-25% by echo in 02/2020 b. at 40% by echo in 03/2020 c. EF normalized to 60-65% by echo in 04/2020   Myocardial infarction [facility])    Peripheral vascular disease    Sleep apnea    Type 2 diabetes mellitus [facility])     Past Surgical History:  Procedure Laterality Date   AMPUTATION Right 10/05/2022   Procedure: AMPUTATION BELOW KNEE;  Surgeon: Harden Jerona GAILS, MD;  Location: [facility] OR;   Service: Orthopedics;  Laterality: Right;   AMPUTATION Right 11/14/2022   Procedure: AMPUTATION BELOW KNEE REVISION; WASHOUT;  Surgeon: Jama Cordella MATSU, MD;  Location: [facility] ORS;  Service: Vascular;  Laterality: Right;   AMPUTATION Right 11/18/2022   Procedure: AMPUTATION BELOW KNEE REVISION AND CLOSURE;  Surgeon: Jama Cordella MATSU, MD;  Location: [facility] ORS;  Service: Vascular;  Laterality: Right;   APPLICATION OF WOUND VAC Right 10/05/2022   Procedure: APPLICATION OF WOUND VAC;  Surgeon: Harden Jerona GAILS, MD;  Location: [facility] OR;  Service: Orthopedics;  Laterality: Right;   BIOPSY  07/18/2021   Procedure: BIOPSY;  Surgeon: [name], DO;  [facility];  Service: Endoscopy;;   BIOPSY  01/09/2022   Procedure: BIOPSY;  Surgeon: [name], DO;  [facility];  Service: Endoscopy;;   BIOPSY  06/29/2023   Procedure: BIOPSY;  Surgeon: [name], DO;  [facility];  Service: Endoscopy;;   COLONOSCOPY WITH PROPOFOL  N/A 07/18/2021   Carver: 15 millimeter polyp removed from the sigmoid colon, 5 mm polyp removed from sigmoid colon.  Nonbleeding internal hemorrhoids.  Significant looping of the colon. sigmoid path showed invasive colonic adenocarcinoma involving tubular adenoma (invades to depth of 2mm, carcinoma 1mm from margin, no lymphovascular invasion, no poorly differentiated component.   COLONOSCOPY WITH PROPOFOL  N/A 06/29/2023   Procedure: COLONOSCOPY WITH PROPOFOL ;  Surgeon: [name], DO;  [facility];  Service: Endoscopy;  Laterality: N/A;  7:30 am, asa 3   CORONARY ARTERY BYPASS GRAFT N/A 03/13/2020   Procedure: CORONARY ARTERY BYPASS GRAFTING (CABG) times five using bilateral Internal mammary arteries and left radial artery;  Surgeon: German Bartlett PEDLAR, MD;  Location: [facility] OR;  Service: Open Heart Surgery;  Laterality: N/A;   DENTAL SURGERY     ESOPHAGEAL DILATION N/A 07/22/2024   Procedure: DILATION, ESOPHAGUS;  Surgeon: [name], DO;  [facility];  Service: Endoscopy;  Laterality: N/A;   ESOPHAGOGASTRODUODENOSCOPY N/A 07/22/2024   Procedure: EGD (ESOPHAGOGASTRODUODENOSCOPY);  Surgeon: [name], DO;  [facility];  Service: Endoscopy;  Laterality: N/A;  9:00 am, asa 3   ESOPHAGOGASTRODUODENOSCOPY (EGD) WITH PROPOFOL  N/A 07/18/2021   Carver: 1 gastric polyp status post biopsy, gastritis. gastric bx with slight chronic inflammation and no H.pyori. GEJ polypectomy with mild inflammation only   FLEXIBLE SIGMOIDOSCOPY N/A 08/26/2021   Carver: Nonbleeding internal hemorrhoids.  15 mm ulcers from previous polypectomy found in the rectum.  No evidence of previous polyp.  Located 5 to 8 cm from anal verge.   FLEXIBLE SIGMOIDOSCOPY N/A 01/09/2022   Procedure: FLEXIBLE SIGMOIDOSCOPY;  Surgeon: [name], DO;  [facility];  Service: Endoscopy;  Laterality: N/A;   IR CYSTOSTOMY TUBE PLACEMENT/BLADDER ASPIRATION  03/11/2024   IRRIGATION AND DEBRIDEMENT ELBOW Left 02/20/2024   Procedure: LEFT ELBOW IRRIGATION AND DEBRIDEMENT;  Surgeon: Germaine Redbird, MD;  Location: [facility] OR;  Service: Orthopedics;  Laterality: Left;   POLYPECTOMY  07/18/2021  Procedure: POLYPECTOMY INTESTINAL;  Surgeon: [name], DO;  [facility];  Service: Endoscopy;;   POLYPECTOMY  06/29/2023   Procedure: POLYPECTOMY INTESTINAL;  Surgeon: [name], DO;  [facility];  Service: Endoscopy;;   RADIAL ARTERY HARVEST Left 03/13/2020   Procedure: RADIAL ARTERY HARVEST,;  Surgeon: German Bartlett PEDLAR, MD;  Location: [facility] OR;  Service: Open Heart Surgery;  Laterality: Left;   RIGHT/LEFT HEART CATH AND CORONARY ANGIOGRAPHY N/A 03/07/2020   Procedure: RIGHT/LEFT HEART CATH AND CORONARY ANGIOGRAPHY;  Surgeon: Wonda Sharper, MD;  Location: [facility];  Service: Cardiovascular;  Laterality: N/A;   STUMP REVISION Right 12/17/2022   Procedure: RIGHT BELOW KNEE AMPUTATION REVISION;   Surgeon: Harden Jerona GAILS, MD;  Location[facility] OR;  Service: Orthopedics;  Laterality: Right;   SUBMUCOSAL LIFTING INJECTION  01/09/2022   Procedure: SUBMUCOSAL LIFTING INJECTION;  Surgeon: [name], DO;  [facility];  Service: Endoscopy;;   SUBMUCOSAL TATTOO INJECTION  01/09/2022   Procedure: SUBMUCOSAL TATTOO INJECTION;  Surgeon: [name], DO;  [facility];  Service: Endoscopy;;   TEE WITHOUT CARDIOVERSION N/A 03/13/2020   Procedure: TRANSESOPHAGEAL ECHOCARDIOGRAM (TEE);  Surgeon: German Bartlett PEDLAR, MD;  Location: [facility] OR;  Service: Open Heart Surgery;  Laterality: N/A;    Current Outpatient Medications  Medication Sig Dispense Refill   acetaminophen  (TYLENOL ) 500 MG tablet Take 500 mg by mouth every 8 (eight) hours as needed for moderate pain (pain score 4-6).     albuterol  (VENTOLIN  HFA) 108 (90 Base) MCG/ACT inhaler Inhale into the lungs every 6 (six) hours as needed for wheezing or shortness of breath.     aspirin  81 MG EC tablet Take 1 tablet (81 mg total) by mouth daily with breakfast. 30 tablet 12   atorvastatin  (LIPITOR ) 40 MG tablet Take 1 tablet (40 mg total) by mouth at bedtime.     AUVELITY  45-105 MG TBCR Take 1 tablet by mouth daily.     Coenzyme Q10 100 MG TABS Take 100 mg by mouth daily.     ferrous sulfate  325 (65 FE) MG tablet Take 325 mg by mouth daily with breakfast.     fluticasone  (FLONASE ) 50 MCG/ACT nasal spray Place 1 spray into both nostrils daily.     fluticasone -salmeterol (ADVAIR) 500-50 MCG/ACT AEPB Inhale 1 puff into the lungs in the morning and at bedtime.     furosemide  (LASIX ) 20 MG tablet Take 20 mg by mouth 2 (two) times daily.     gabapentin  (NEURONTIN ) 400 MG capsule Take 1 capsule (400 mg total) by mouth 2 (two) times daily.     HYDROcodone -acetaminophen  (NORCO/VICODIN) 5-325 MG tablet Take 1 tablet by mouth every 4 (four) hours as needed (pain).     ipratropium-albuterol  (DUONEB) 0.5-2.5 (3) MG/3ML SOLN Take 3 mLs by  nebulization every 6 (six) hours as needed.     lactulose  (CHRONULAC ) 10 GM/15ML solution Take 15 mLs (10 g total) by mouth 2 (two) times daily. (Patient taking differently: Take 15 g by mouth 2 (two) times daily.) 473 mL 0   levothyroxine  (SYNTHROID ) 100 MCG tablet Take 100 mcg by mouth daily.     Luliconazole 1 % CREA Apply topically daily.     metolazone  (ZAROXOLYN ) 2.5 MG tablet Take 1 tablet (2.5 mg total) by mouth once a week. On Wednesdays     naloxegol  oxalate (MOVANTIK ) 12.5 MG TABS tablet Take 1 tablet (12.5 mg total) by mouth daily. 1 hour before  eating 30 tablet 3   Nutritional Supplements (FEEDING SUPPLEMENT, NEPRO CARB STEADY,) LIQD Take 120 mLs by mouth 2 (two) times daily between meals. (Patient taking differently: Take 120 mLs by mouth 3 (three) times daily with meals.)     Omega 3-6-9 Fatty Acids (FLAX + DHA PO) Take by mouth daily at 6 (six) AM.     OXYGEN Inhale 2 L/min into the lungs continuous.     pantoprazole  (PROTONIX ) 40 MG tablet Take 1 tablet (40 mg total) by mouth daily. Before breakfast 90 tablet 3   polyethylene glycol (MIRALAX  / GLYCOLAX ) 17 g packet Take 17 g by mouth 2 (two) times daily. (Patient taking differently: Take 17 g by mouth daily.) 14 each 0   polyvinyl alcohol  (LIQUIFILM TEARS) 1.4 % ophthalmic solution Place 1 drop into both eyes daily.     senna-docusate (SENOKOT-S) 8.6-50 MG tablet Take 2 tablets by mouth at bedtime. (Patient taking differently: Take 2 tablets by mouth 2 (two) times daily.)     sodium bicarbonate  325 MG tablet Take 325 mg by mouth 2 (two) times daily.     tamsulosin  (FLOMAX ) 0.4 MG CAPS capsule Take 0.8 mg by mouth every evening.     thiamine  (VITAMIN B-1) 100 MG tablet Take 1 tablet (100 mg total) by mouth daily.     traZODone  (DESYREL ) 100 MG tablet Take 1 tablet (100 mg total) by mouth at bedtime.     Vitamin D , Ergocalciferol , (DRISDOL ) 1.25 MG (50000 UNIT) CAPS capsule Take 50,000 Units by mouth every 7 (seven) days.      bacitracin  500 UNIT/GM ointment Apply 1 Application topically 2 (two) times daily. (Patient not taking: Reported on 12/14/2024) 15 g 0   cabotegravir  ER (APRETUDE ) 600 MG/3ML injection Inject 3 mLs (600 mg total) into the muscle every 2 (two) months. (Patient not taking: Reported on 12/14/2024) 3 mL 5   carvedilol  (COREG ) 25 MG tablet Take 0.5 tablets (12.5 mg total) by mouth 2 (two) times daily with a meal. (Patient not taking: Reported on 12/14/2024)     cetirizine (ZYRTEC) 10 MG tablet Take 10 mg by mouth daily as needed for allergies. (Patient not taking: Reported on 12/14/2024)     Docosahexaenoic Acid (DHA PO) Take 1 capsule by mouth daily. Dosage not listed on MAR (Patient not taking: Reported on 12/14/2024)     lamoTRIgine  (LAMICTAL ) 100 MG tablet Take 100 mg by mouth 2 (two) times daily.     lubiprostone  (AMITIZA ) 24 MCG capsule Take 1 capsule (24 mcg total) by mouth 2 (two) times daily with a meal. 60 capsule 3   Multiple Vitamin (MULTIVITAMIN) tablet Take 1 tablet by mouth daily. (Patient not taking: Reported on 12/14/2024)     mupirocin  ointment (BACTROBAN ) 2 % Place 1 Application into the nose 2 (two) times daily. (Patient not taking: Reported on 12/14/2024) 22 g 0   torsemide  (DEMADEX ) 20 MG tablet Take 1 tablet (20 mg total) by mouth 2 (two) times daily. (Patient not taking: Reported on 12/14/2024)     No current facility-administered medications for this visit.    Allergies as of 12/14/2024 - Review Complete 12/14/2024  Allergen Reaction Noted   Ceftriaxone  Anaphylaxis 09/29/2023   Ace inhibitors Swelling and Cough 12/11/2017   Chlorhexidine   10/25/2023   Haloperidol  and related  10/14/2022   Other Itching 11/15/2019   Pork allergy Other (See Comments) 02/20/2024   Shellfish allergy  10/05/2022   Penicillins Itching and Rash 12/11/2017    Family History  Problem  Relation Age of Onset   Hypertension Mother     Social History   Socioeconomic History   Marital status: Single    Spouse  name: Not on file   Number of children: Not on file   Years of education: Not on file   Highest education level: Not on file  Occupational History   Not on file  Tobacco Use   Smoking status: Former    Current packs/day: 0.00    Average packs/day: 1.5 packs/day    Types: Cigarettes    Quit date: 05/25/1995    Years since quitting: 29.5   Smokeless tobacco: Never  Vaping Use   Vaping status: Never Used  Substance and Sexual Activity   Alcohol  use: Not Currently    Comment: rarely   Drug use: No   Sexual activity: Not Currently  Other Topics Concern   Not on file  Social History Narrative   Not on file   Social Drivers of Health   Tobacco Use: Medium Risk (12/14/2024)   Patient History    Smoking Tobacco Use: Former    Smokeless Tobacco Use: Never    Passive Exposure: Not on Actuary Strain: Not on file  Food Insecurity: No Food Insecurity (08/10/2024)   Epic    Worried About Programme Researcher, Broadcasting/film/video in the Last Year: Never true    Ran Out of Food in the Last Year: Never true  Transportation Needs: No Transportation Needs (08/10/2024)   Epic    Lack of Transportation (Medical): No    Lack of Transportation (Non-Medical): No  Physical Activity: Not on file  Stress: Not on file  Social Connections: Unknown (02/21/2024)   Social Connection and Isolation Panel    Frequency of Communication with Friends and Family: Not on file    Frequency of Social Gatherings with Friends and Family: Not on file    Attends Religious Services: Not on file    Active Member of Clubs or Organizations: Not on file    Attends Banker Meetings: Not on file    Marital Status: Never married  Intimate Partner Violence: Not At Risk (08/10/2024)   Epic    Fear of Current or Ex-Partner: No    Emotionally Abused: No    Physically Abused: No    Sexually Abused: No  Depression (PHQ2-9): Low Risk (06/09/2024)   Depression (PHQ2-9)    PHQ-2 Score: 3  Alcohol  Screen: Low Risk  (11/05/2022)   Alcohol  Screen    Last Alcohol  Screening Score (AUDIT): 0  Housing: Low Risk (08/10/2024)   Epic    Unable to Pay for Housing in the Last Year: No    Number of Times Moved in the Last Year: 0    Homeless in the Last Year: No  Utilities: Not At Risk (08/10/2024)   Epic    Threatened with loss of utilities: No  Health Literacy: Not on file     Review of Systems   Gen: Denies any fever, chills, fatigue, weight loss, lack of appetite.  CV: Denies chest pain, heart palpitations, peripheral edema, syncope.  Resp: Denies shortness of breath at rest or with exertion. Denies wheezing or cough.  GI: Denies dysphagia or odynophagia. Denies jaundice, hematemesis, fecal incontinence. GU : Denies urinary burning, urinary frequency, urinary hesitancy MS: Denies joint pain, muscle weakness, cramps, or limitation of movement.  Derm: Denies rash, itching, dry skin Psych: Denies depression, anxiety, memory loss, and confusion Heme: Denies bruising, bleeding, and enlarged lymph nodes.  Physical Exam   BP (!) 151/81 (BP Location: Left Arm, Patient Position: Sitting, Cuff Size: Large)   Pulse 64   Temp 98.2 F (36.8 C) (Temporal)   Ht 5' 10 (1.778 m)   BMI 35.54 kg/m  General:   Alert and oriented. Pleasant and cooperative. Well-nourished and well-developed.  Head:  Normocephalic and atraumatic. Eyes:  Without icterus Abdomen:  +BS, soft, non-tender and non-distended. No HSM noted. No guarding or rebound. No masses appreciated.  Rectal:  Deferred  Msk:  Symmetrical without gross deformities. Normal posture. Extremities:  Without edema. Neurologic:  Alert and  oriented x4;  grossly normal neurologically. Skin:  Intact without significant lesions or rashes. Psych:  Alert and cooperative. Normal mood and affect.   Assessment   Shane Chambers is a 64 y.o. male presenting today with a history of    PLAN   *****    Therisa MICAEL Stager, PhD, ANP-BC [facility]  Gastroenterology    "

## 2024-12-14 NOTE — Patient Instructions (Signed)
 Continue Movantik  daily, Senna 2 tablets twice a day, and Miralax  as needed. I have also ordered a dulcolax suppository to take every Mon, W, Friday evening, but you can decline this if needed.  I would like to try a little fiber each day. You can decline this as well if needed!  I have increased pantoprazole  to twice a day.  We will see you in 6 weeks or sooner if needed!   I enjoyed seeing you again today! I value our relationship and want to provide genuine, compassionate, and quality care. You may receive a survey regarding your visit with me, and I welcome your feedback! Thanks so much for taking the time to complete this. I look forward to seeing you again.      Therisa MICAEL Stager, PhD, ANP-BC [facility] Gastroenterology

## 2024-12-15 ENCOUNTER — Telehealth: Payer: Self-pay

## 2024-12-15 NOTE — Telephone Encounter (Signed)
 Called pt to rescheduled appointment. Spoke with transportation to rescheduled appointment.

## 2024-12-20 ENCOUNTER — Ambulatory Visit

## 2024-12-27 ENCOUNTER — Ambulatory Visit

## 2025-01-09 ENCOUNTER — Ambulatory Visit (HOSPITAL_BASED_OUTPATIENT_CLINIC_OR_DEPARTMENT_OTHER): Admitting: Pulmonary Disease

## 2025-01-20 ENCOUNTER — Ambulatory Visit: Payer: Self-pay | Admitting: Pharmacist

## 2025-03-23 ENCOUNTER — Ambulatory Visit: Payer: Self-pay | Admitting: Pharmacist

## 2025-06-07 ENCOUNTER — Other Ambulatory Visit

## 2025-06-07 ENCOUNTER — Ambulatory Visit: Payer: Self-pay | Admitting: Oncology
